# Patient Record
Sex: Female | Born: 1960 | Race: Black or African American | Hispanic: No | Marital: Single | Smoking: Former smoker
Health system: Southern US, Community
[De-identification: ages and names within clinical notes are randomized; demographics above are authoritative.]

## PROBLEM LIST (undated history)

## (undated) DIAGNOSIS — T7840XA Allergy, unspecified, initial encounter: Secondary | ICD-10-CM

## (undated) DIAGNOSIS — F419 Anxiety disorder, unspecified: Secondary | ICD-10-CM

## (undated) DIAGNOSIS — I509 Heart failure, unspecified: Secondary | ICD-10-CM

## (undated) DIAGNOSIS — J45909 Unspecified asthma, uncomplicated: Secondary | ICD-10-CM

## (undated) DIAGNOSIS — F1410 Cocaine abuse, uncomplicated: Secondary | ICD-10-CM

## (undated) DIAGNOSIS — B1920 Unspecified viral hepatitis C without hepatic coma: Secondary | ICD-10-CM

## (undated) DIAGNOSIS — Z720 Tobacco use: Secondary | ICD-10-CM

## (undated) DIAGNOSIS — J439 Emphysema, unspecified: Secondary | ICD-10-CM

## (undated) DIAGNOSIS — I10 Essential (primary) hypertension: Secondary | ICD-10-CM

## (undated) DIAGNOSIS — J939 Pneumothorax, unspecified: Secondary | ICD-10-CM

## (undated) DIAGNOSIS — J449 Chronic obstructive pulmonary disease, unspecified: Secondary | ICD-10-CM

## (undated) DIAGNOSIS — E7800 Pure hypercholesterolemia, unspecified: Secondary | ICD-10-CM

## (undated) DIAGNOSIS — I2510 Atherosclerotic heart disease of native coronary artery without angina pectoris: Secondary | ICD-10-CM

## (undated) HISTORY — PX: CARDIAC CATHETERIZATION: SHX172

## (undated) HISTORY — DX: Allergy, unspecified, initial encounter: T78.40XA

## (undated) HISTORY — DX: Unspecified viral hepatitis C without hepatic coma: B19.20

## (undated) HISTORY — PX: CHEST TUBE INSERTION: SHX231

## (undated) HISTORY — DX: Anxiety disorder, unspecified: F41.9

## (undated) NOTE — *Deleted (*Deleted)
@Patient  ID: Michelle [name], female    DOB: Dec 05, 1960, 70 y.o.   MRN: [id number]  No chief complaint on file.   Referring provider: Marletta Lor, NP  HPI:  34 year old female former smoker followed in our office for COPD, chronic respiratory failure, history of aspiration pneumonia  PMH: Depression, anxiety, PEG tube, protein calorie malnutrition, CAD, hypertension, diastolic heart failure, type 2 diabetes Smoker/ Smoking History: Former smoker Maintenance:   Pt of: DK  04/26/2020  - Visit     Questionaires / Pulmonary Flowsheets:   ACT:  No flowsheet data found.  MMRC: No flowsheet data found.  Epworth:  No flowsheet data found.  Tests:   FENO:  No results found for: NITRICOXIDE  PFT: No flowsheet data found.  WALK:  No flowsheet data found.  Imaging: No results found.  Lab Results:  CBC    Component Value Date/Time   WBC 4.9 09/03/2019 0433   RBC 4.15 09/03/2019 0433   HGB 10.9 (L) 09/03/2019 0433   HGB 12.4 07/11/2014 0434   HCT 35.7 (L) 09/03/2019 0433   HCT 38.9 07/11/2014 0434   PLT 172 09/03/2019 0433   PLT 251 07/11/2014 0434   MCV 86.0 09/03/2019 0433   MCV 87 07/11/2014 0434   MCH 26.3 09/03/2019 0433   MCHC 30.5 09/03/2019 0433   RDW 16.2 (H) 09/03/2019 0433   RDW 13.9 07/11/2014 0434   LYMPHSABS 1.1 09/03/2019 0433   LYMPHSABS 0.9 (L) 07/11/2014 0434   MONOABS 0.6 09/03/2019 0433   MONOABS 1.0 (H) 07/11/2014 0434   EOSABS 0.2 09/03/2019 0433   EOSABS 0.0 07/11/2014 0434   BASOSABS 0.0 09/03/2019 0433   BASOSABS 0.0 07/11/2014 0434    BMET    Component Value Date/Time   NA 144 09/03/2019 0433   NA 139 07/11/2014 0434   K 3.3 (L) 09/03/2019 0433   K 4.6 07/11/2014 0434   CL 111 09/03/2019 0433   CL 104 07/11/2014 0434   CO2 24 09/03/2019 0433   CO2 30 07/11/2014 0434   GLUCOSE 114 (H) 09/03/2019 0433   GLUCOSE 123 (H) 07/11/2014 0434   BUN 19 09/03/2019 0433   BUN 18 07/11/2014 0434   CREATININE 0.68 09/03/2019 0433    CREATININE 0.80 07/11/2014 0434   CALCIUM 8.3 (L) 09/03/2019 0433   CALCIUM 8.9 07/11/2014 0434   GFRNONAA >60 09/03/2019 0433   GFRNONAA >60 07/11/2014 0434   GFRNONAA >60 11/04/2013 0354   GFRAA >60 09/03/2019 0433   GFRAA >60 07/11/2014 0434   GFRAA >60 11/04/2013 0354    BNP    Component Value Date/Time   BNP 42.0 06/24/2019 2057    ProBNP No results found for: PROBNP  Specialty Problems      Pulmonary Problems   Community acquired pneumonia   COPD exacerbation (HCC)   Acute on chronic respiratory failure with hypoxia (HCC)   Acute on chronic respiratory failure with hypoxia and hypercapnia (HCC)   Pneumonia   Chronic respiratory failure requiring continuous mechanical ventilation through tracheostomy (HCC)   Pseudomonas respiratory infection   HCAP (healthcare-associated pneumonia)   Atypical pneumonia      Allergies  Allergen Reactions  . Amoxicillin     Patient has tolerated cephalosporins in the past  . Ativan [Lorazepam]     Makes anxiety worse     Immunization History  Administered Date(s) Administered  . PFIZER SARS-COV-2 Vaccination 02/17/2020, 03/08/2020  . Pneumococcal Conjugate-13 12/22/2017    Past Medical History:  Diagnosis Date  .  Allergy   . Anxiety   . Asthma   . CHF (congestive heart failure) (HCC)   . Cocaine abuse (HCC)   . COPD (chronic obstructive pulmonary disease) (HCC)   . Coronary artery disease   . Emphysema/COPD (HCC)   . Hepatitis C   . High cholesterol   . Hypertension   . Pneumothorax   . Tobacco abuse     Tobacco History: Social History   Tobacco Use  Smoking Status Former Smoker  . Packs/day: 0.10  . Years: 41.00  . Pack years: 4.10  . Types: Cigarettes  . Quit date: 06/09/2017  . Years since quitting: 2.8  Smokeless Tobacco Never Used   Counseling given: Not Answered   Continue to not smoke  Outpatient Encounter Medications as of 04/26/2020  Medication Sig  . aspirin 81 MG chewable tablet Chew  81 mg by mouth daily.   . blood glucose meter kit and supplies KIT Dispense based on patient and insurance preference. Use up to four times daily as directed. (FOR ICD-9 250.00, 250.01).  . budesonide (PULMICORT) 0.25 MG/2ML nebulizer solution Take 2 mLs (0.25 mg total) by nebulization 2 (two) times daily. (Patient taking differently: Take 0.5 mg by nebulization 2 (two) times daily. )  . clonazePAM (KLONOPIN) 0.5 MG tablet Take 0.5 mg by mouth in the morning, at noon, and at bedtime.  . cloNIDine (CATAPRES - DOSED IN MG/24 HR) 0.1 mg/24hr patch Place 0.1 mg onto the skin once a week.  . fluticasone (FLONASE) 50 MCG/ACT nasal spray Place 1 spray into both nostrils daily.   Marland Kitchen gabapentin (NEURONTIN) 100 MG capsule Take 100 mg by mouth 3 (three) times daily. One tablet in am and mid day, and 3 at bedtime.  Marland Kitchen guaiFENesin (MUCINEX) 600 MG 12 hr tablet Take 600 mg by mouth 2 (two) times daily.  . hydrOXYzine (ATARAX/VISTARIL) 25 MG tablet Take 25 mg by mouth every 6 (six) hours as needed for anxiety. (Patient not taking: Reported on 04/07/2020)  . insulin detemir (LEVEMIR) 100 UNIT/ML FlexPen Inject 10 Units into the skin at bedtime.  . Insulin Pen Needle (PEN NEEDLES) 32G X 6 MM MISC 1 each by Does not apply route 4 (four) times daily - after meals and at bedtime.  Marland Kitchen ipratropium-albuterol (DUONEB) 0.5-2.5 (3) MG/3ML SOLN Take 3 mLs by nebulization every 6 (six) hours. (Patient taking differently: Take 3 mLs by nebulization every 6 (six) hours as needed (for shortness of breath). )  . mirtazapine (REMERON SOL-TAB) 30 MG disintegrating tablet Take 1 tablet (30 mg total) by mouth at bedtime.  . polyethylene glycol (MIRALAX / GLYCOLAX) 17 g packet Place 17 g into feeding tube daily as needed.  (Patient not taking: Reported on 04/07/2020)  . senna (SENOKOT) 8.6 MG TABS tablet Place 1-2 tablets into feeding tube at bedtime as needed for mild constipation.  (Patient not taking: Reported on 04/07/2020)   No  facility-administered encounter medications on file as of 04/26/2020.     Review of Systems  Review of Systems   Physical Exam  LMP 03/06/2005 (Approximate)   Wt Readings from Last 5 Encounters:  08/29/19 190 lb 0.6 oz (86.2 kg)  08/09/19 186 lb 8.2 oz (84.6 kg)  06/25/19 197 lb 5 oz (89.5 kg)  03/14/19 189 lb 6 oz (85.9 kg)  12/08/18 164 lb (74.4 kg)    BMI Readings from Last 5 Encounters:  08/29/19 33.66 kg/m  08/09/19 35.26 kg/m  06/25/19 38.53 kg/m  03/14/19 36.98 kg/m  12/08/18  30.99 kg/m     Physical Exam    Assessment & Plan:   No problem-specific Assessment & Plan notes found for this encounter.    No follow-ups on file.   Coral Ceo, NP 04/26/2020   This appointment required *** minutes of patient care (this includes precharting, chart review, review of results, face-to-face care, etc.).

---

## 2006-11-07 ENCOUNTER — Emergency Department: Payer: Self-pay | Admitting: Emergency Medicine

## 2008-02-20 IMAGING — CR DG CHEST 2V
1 series · 2 of 2 positions shown · non-contrast
Comparison: none

REASON FOR EXAM: COUGH
COMMENTS:

PROCEDURE:     DXR - DXR CHEST PA (OR AP) AND LATERAL  - November 07, 2006 [DATE]
RESULT:     The lung fields are clear. The heart, mediastinal and osseous
structures show no significant abnormalities. The chest is hyperexpanded,
suspicious for reactive airway disease.

[Series 1: view not recorded · 0.17mm/px · 2 of 2 slices shown]
[im 1/2]
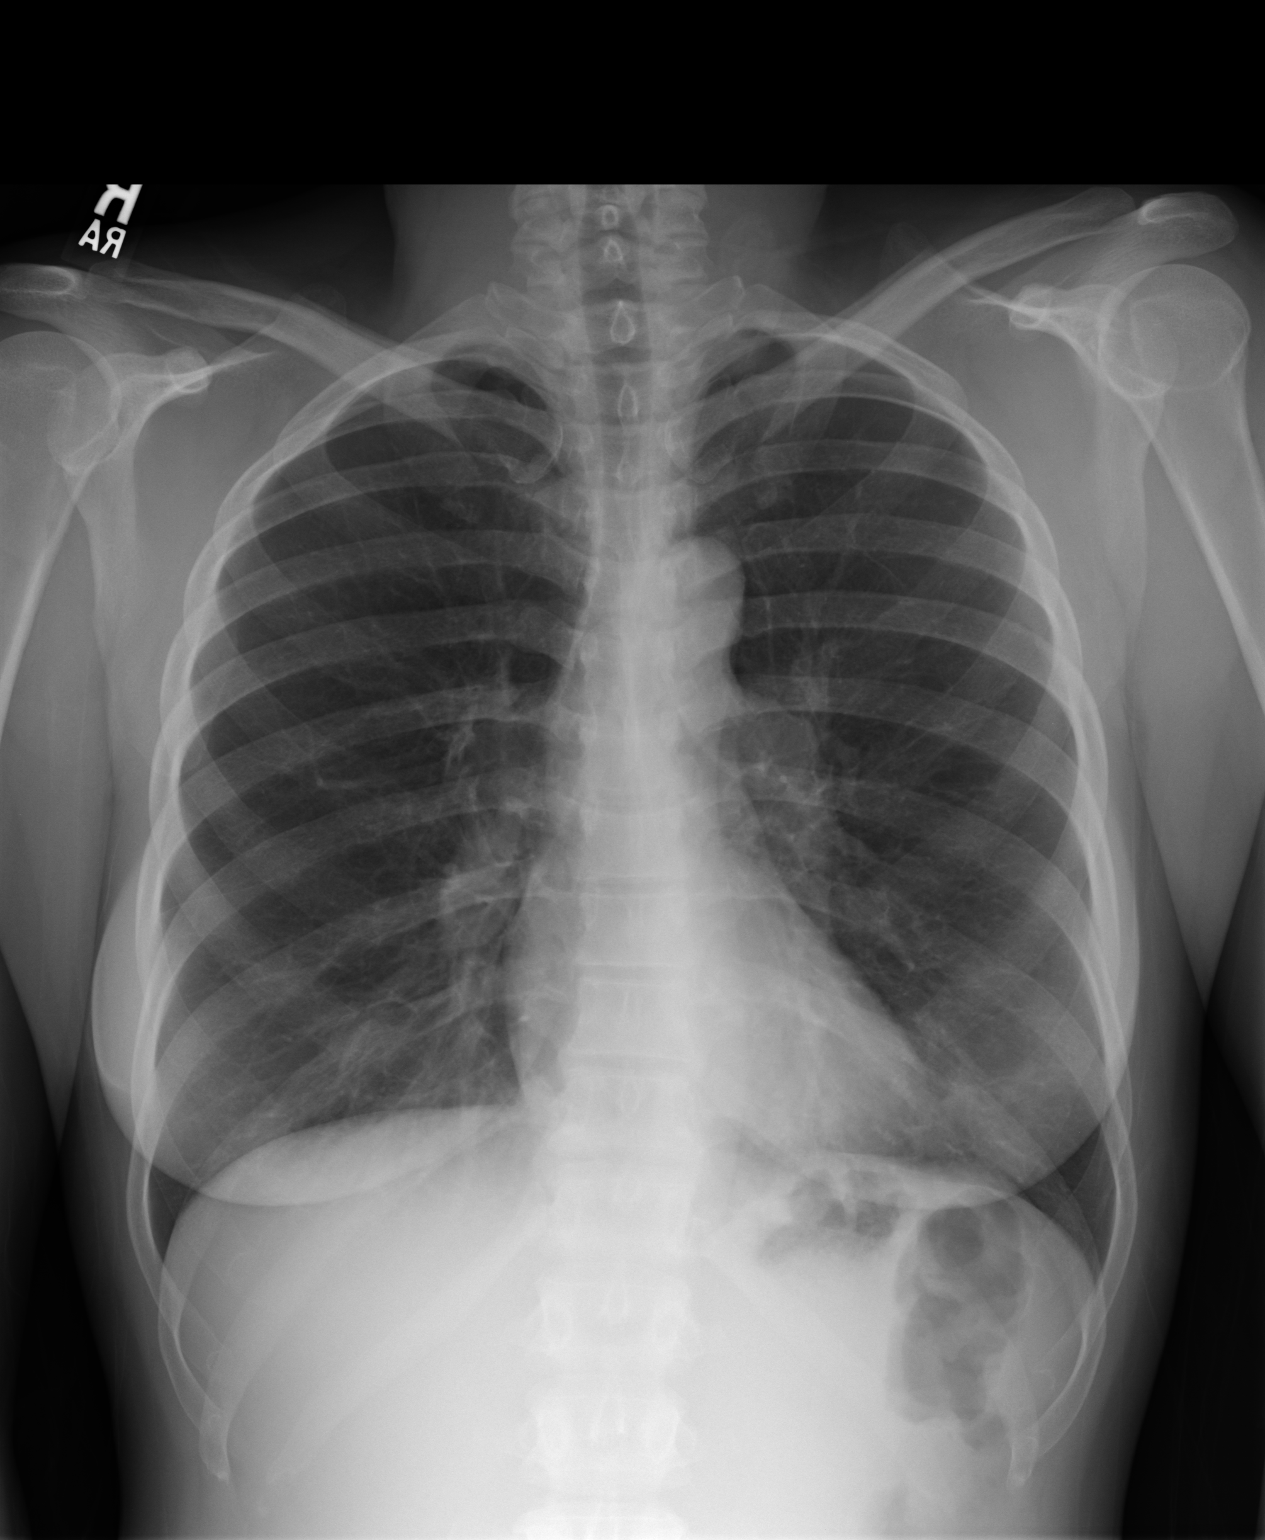
[im 2/2]
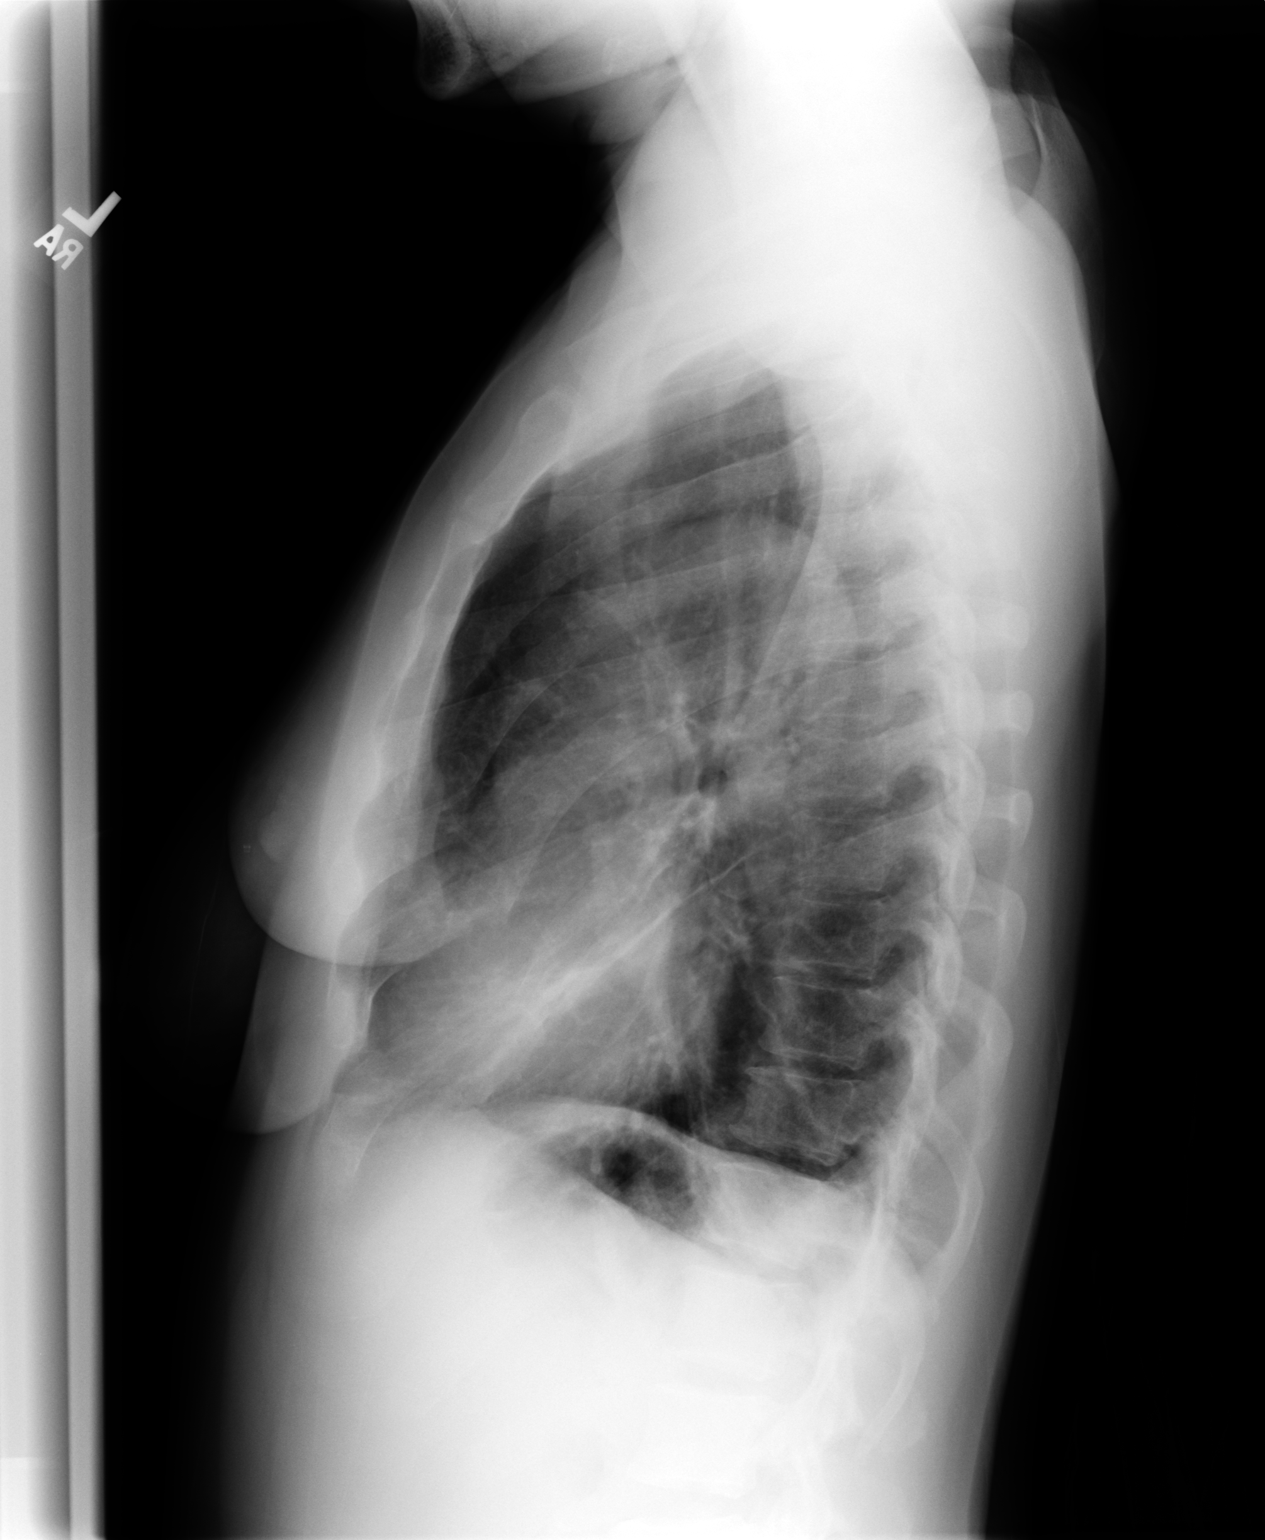

[2 of 2 positions shown; findings below may reference images not displayed]

IMPRESSION: 1. The lung fields are clear.
2. The chest is hyperexpanded, suspicious for reactive airway disease.

## 2009-09-30 ENCOUNTER — Inpatient Hospital Stay: Payer: Self-pay | Admitting: Specialist

## 2009-12-10 IMAGING — US US BREAST BILAT
1 series · 5 of 5 positions shown · non-contrast
Comparison: none

[Series 1: us breast bilat · 5 of 5 slices shown]
[im 1/5]
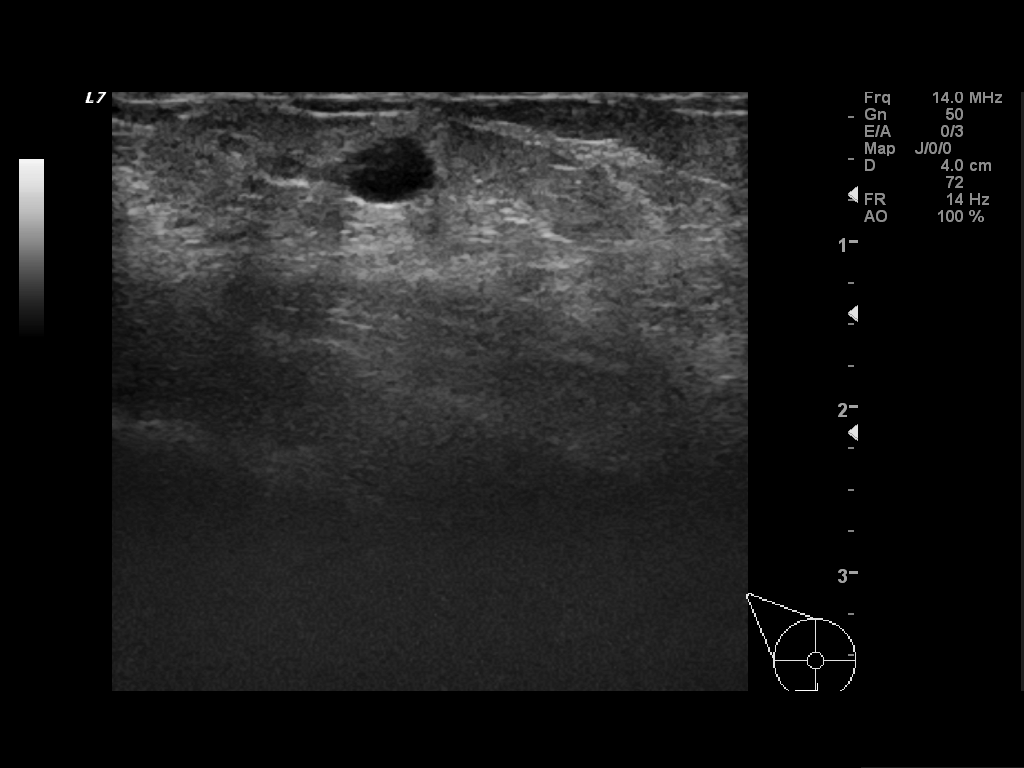
[im 2/5]
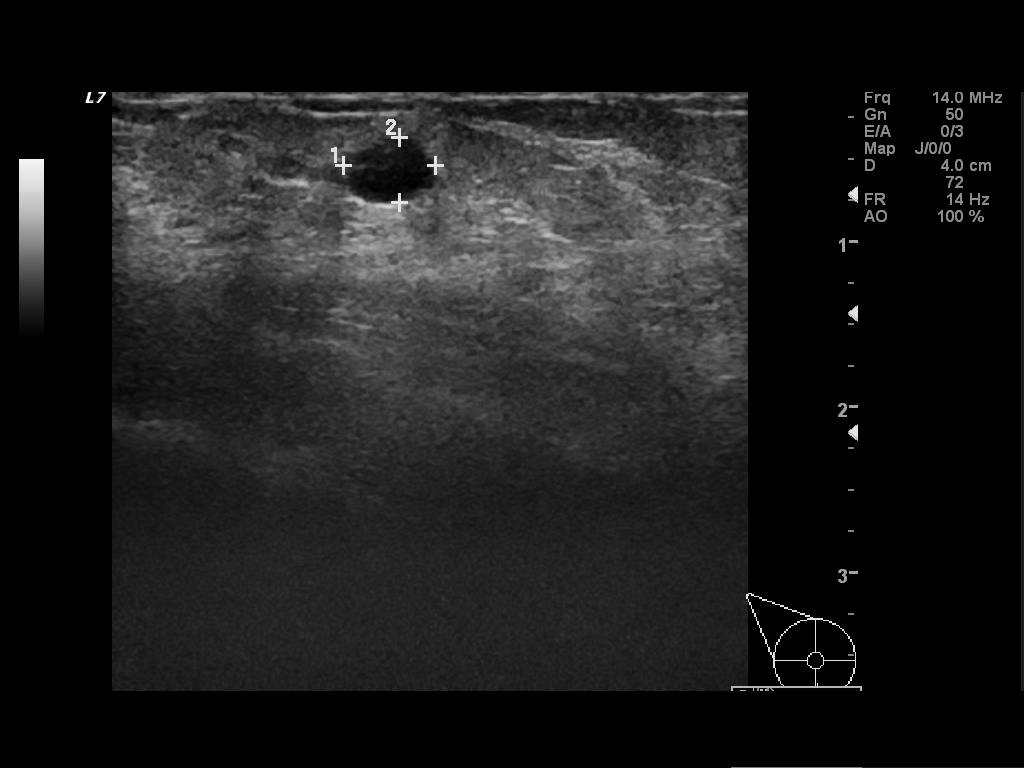
[im 3/5]
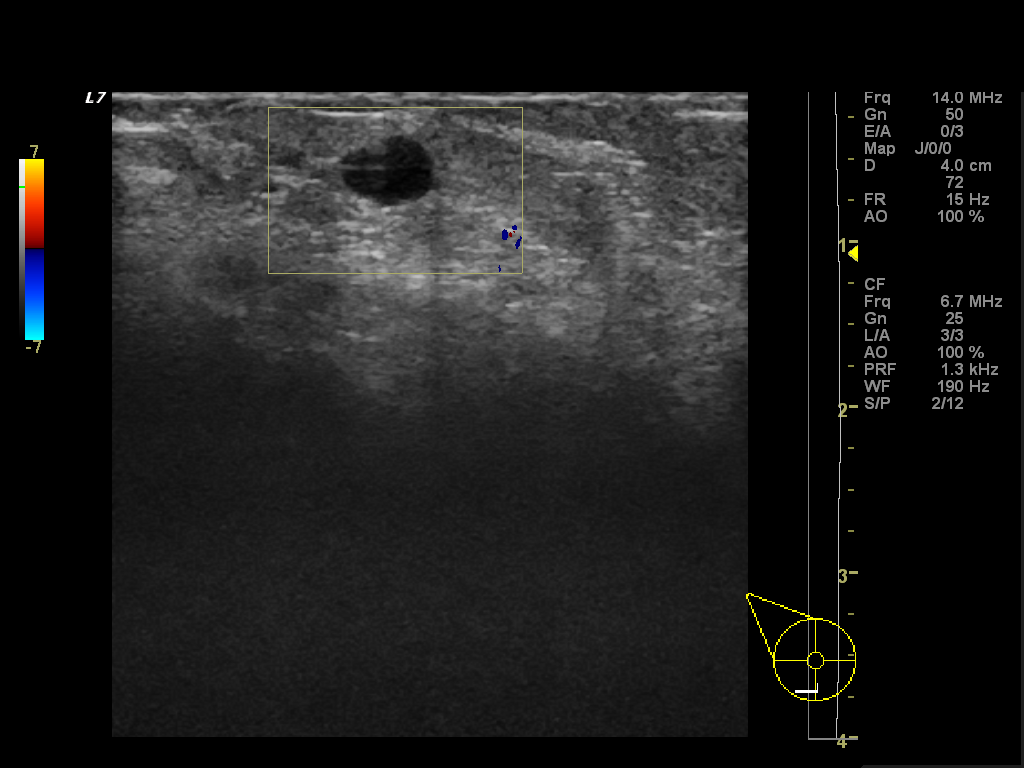
[im 4/5]
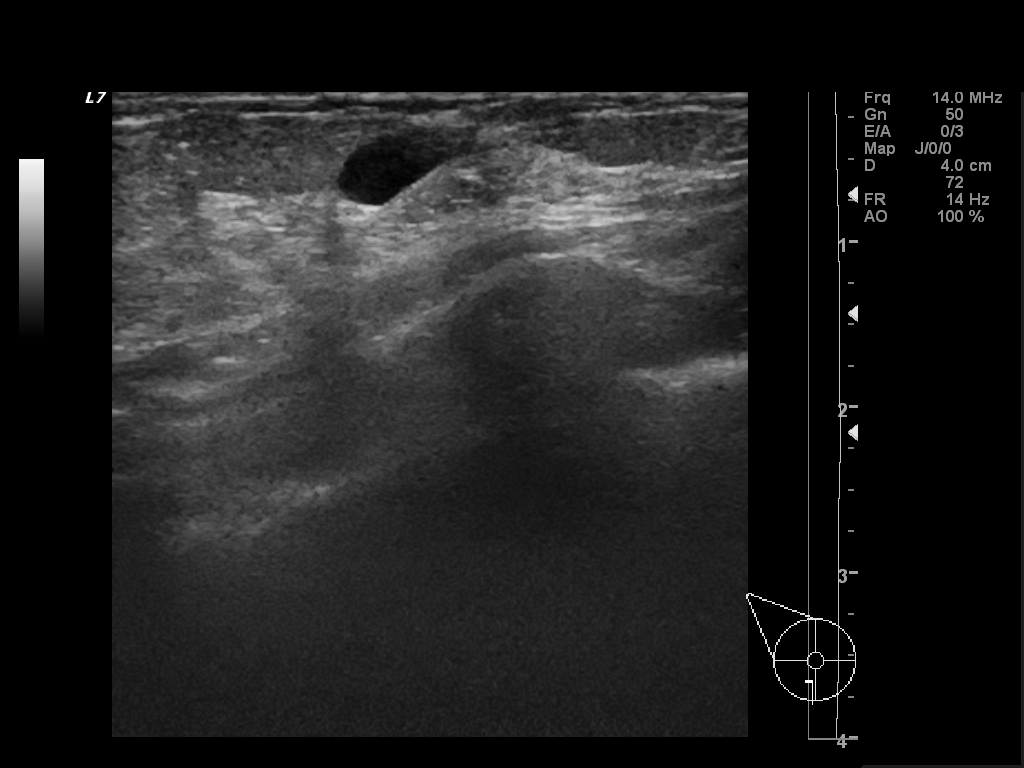
[im 5/5]
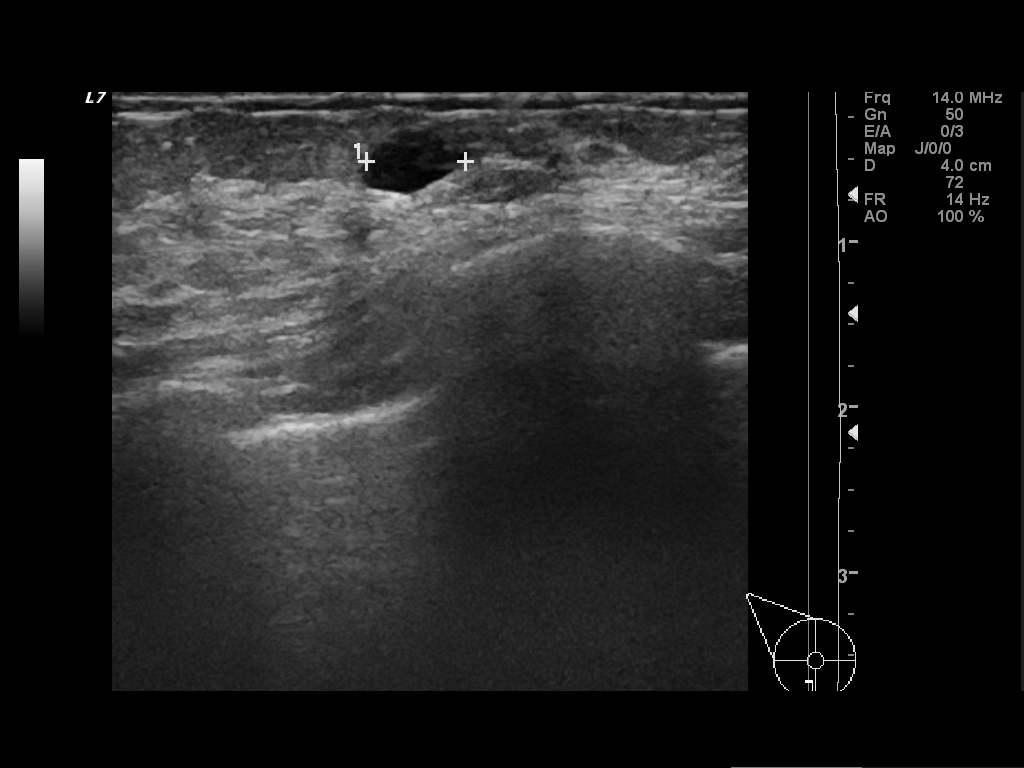

[5 of 5 positions shown; findings below may reference images not displayed]

Canned report from images found in remote index.

Refer to host system for actual result text.

## 2010-09-06 ENCOUNTER — Ambulatory Visit: Payer: Self-pay | Admitting: Family Medicine

## 2010-10-11 ENCOUNTER — Inpatient Hospital Stay: Payer: Self-pay | Admitting: Internal Medicine

## 2011-01-13 IMAGING — CR DG CHEST 1V PORT
1 series · 1 of 1 positions shown · non-contrast
Comparison: none

REASON FOR EXAM: Respiratory distress
COMMENTS:

[view not recorded]
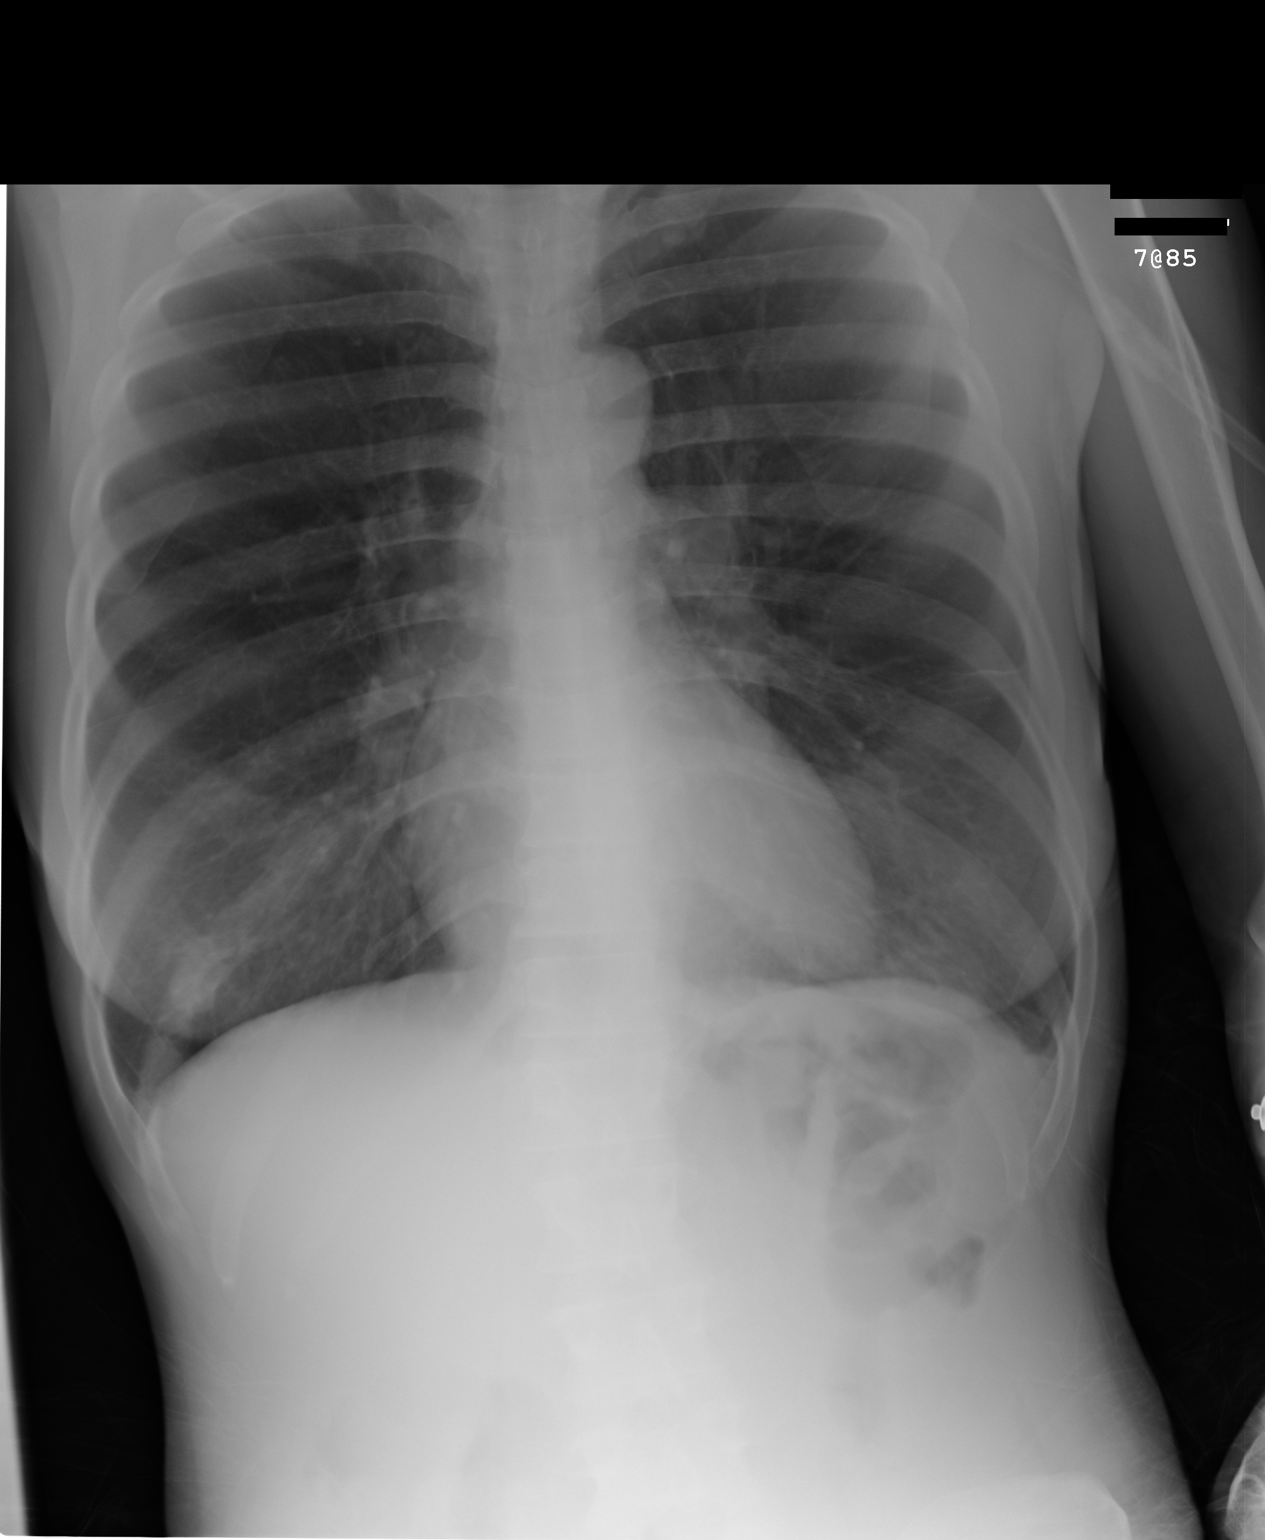

[1 of 1 positions shown; findings below may reference images not displayed]

PROCEDURE:     DXR - DXR PORTABLE CHEST SINGLE VIEW  - September 30, 2009  [DATE]

RESULT:     Comparison is made to a prior exam of 11/07/2006. There is noted a
focal density at the right base. This is thought to likely represent a
confluence of density secondary to overlapping ribs, vascular markings and
the right nipple shadow. An early focal infiltrate cannot be totally
excluded but at this point is thought to be unlikely. Nonetheless, follow-up
examination is suggested if clinically indicated. The lung fields otherwise
are clear. The chest appears mildly hyperexpanded. The possibility of
reactive airway disease cannot be excluded. Heart size is normal. The
mediastinal and osseous structures are normal in appearance.
IMPRESSION: 1. There is a focal density at the RIGHT base, most likely artifactual and
secondary to overlapping densities.
2. The chest is mildly hyperexpanded, suspicious for history of reactive
airway disease.

## 2011-06-05 ENCOUNTER — Inpatient Hospital Stay: Payer: Self-pay | Admitting: Internal Medicine

## 2011-06-05 LAB — CBC
HCT: 44.7 % (ref 35.0–47.0)
MCHC: 33.4 g/dL (ref 32.0–36.0)
Platelet: 156 10*3/uL (ref 150–440)
RBC: 5.12 10*6/uL (ref 3.80–5.20)
RDW: 13.3 % (ref 11.5–14.5)

## 2011-06-05 LAB — COMPREHENSIVE METABOLIC PANEL
Albumin: 3.9 g/dL (ref 3.4–5.0)
Alkaline Phosphatase: 61 U/L (ref 50–136)
Bilirubin,Total: 0.4 mg/dL (ref 0.2–1.0)
Creatinine: 0.75 mg/dL (ref 0.60–1.30)
EGFR (African American): >60
Glucose: 153 mg/dL — ABNORMAL HIGH (ref 65–99)
Osmolality: 287 (ref 275–301)
Potassium: 3.3 mmol/L — ABNORMAL LOW (ref 3.5–5.1)
Sodium: 142 mmol/L (ref 136–145)

## 2011-06-05 LAB — MAGNESIUM: Magnesium: 1.8 mg/dL

## 2011-06-05 LAB — PRO B NATRIURETIC PEPTIDE: B-Type Natriuretic Peptide: 90 pg/mL (ref 0–125)

## 2011-06-05 LAB — RAPID INFLUENZA A&B ANTIGENS

## 2011-06-06 LAB — CBC WITH DIFFERENTIAL/PLATELET
Basophil %: 0 %
Eosinophil %: 0 %
HGB: 12.1 g/dL (ref 12.0–16.0)
Lymphocyte #: 0.6 10*3/uL — ABNORMAL LOW (ref 1.0–3.6)
MCH: 28.6 pg (ref 26.0–34.0)
MCV: 90 fL (ref 80–100)
Monocyte #: 0.5 10*3/uL (ref 0.0–0.7)
Neutrophil %: 83.7 %
Platelet: 166 10*3/uL (ref 150–440)
RBC: 4.22 10*6/uL (ref 3.80–5.20)
WBC: 6.9 10*3/uL (ref 3.6–11.0)

## 2011-06-06 LAB — BASIC METABOLIC PANEL
Anion Gap: 10 (ref 7–16)
BUN: 10 mg/dL (ref 7–18)
Chloride: 106 mmol/L (ref 98–107)
Glucose: 129 mg/dL — ABNORMAL HIGH (ref 65–99)
Osmolality: 276 (ref 275–301)
Potassium: 4.4 mmol/L (ref 3.5–5.1)

## 2011-06-10 LAB — CULTURE, BLOOD (SINGLE)

## 2011-06-11 LAB — EXPECTORATED SPUTUM ASSESSMENT W GRAM STAIN, RFLX TO RESP C

## 2011-10-04 ENCOUNTER — Observation Stay: Payer: Self-pay | Admitting: Specialist

## 2011-10-04 LAB — URINALYSIS, COMPLETE
Bilirubin,UR: NEGATIVE
Leukocyte Esterase: NEGATIVE
Ph: 6 (ref 4.5–8.0)
Protein: 100
Squamous Epithelial: 5
WBC UR: 3 /HPF (ref 0–5)

## 2011-10-04 LAB — DRUG SCREEN, URINE
Amphetamines, Ur Screen: NEGATIVE (ref ?–1000)
Barbiturates, Ur Screen: NEGATIVE (ref ?–200)
Benzodiazepine, Ur Scrn: NEGATIVE (ref ?–200)
Opiate, Ur Screen: NEGATIVE (ref ?–300)
Phencyclidine (PCP) Ur S: NEGATIVE (ref ?–25)
Tricyclic, Ur Screen: NEGATIVE (ref ?–1000)

## 2011-10-04 LAB — COMPREHENSIVE METABOLIC PANEL
Alkaline Phosphatase: 83 U/L (ref 50–136)
Anion Gap: 8 (ref 7–16)
BUN: 20 mg/dL — ABNORMAL HIGH (ref 7–18)
Bilirubin,Total: 0.6 mg/dL (ref 0.2–1.0)
Chloride: 105 mmol/L (ref 98–107)
Creatinine: 0.91 mg/dL (ref 0.60–1.30)
EGFR (African American): >60
EGFR (Non-African Amer.): >60
Glucose: 105 mg/dL — ABNORMAL HIGH (ref 65–99)
Osmolality: 282 (ref 275–301)
Potassium: 3.5 mmol/L (ref 3.5–5.1)
SGOT(AST): 31 U/L (ref 15–37)
Sodium: 140 mmol/L (ref 136–145)

## 2011-10-04 LAB — MAGNESIUM: Magnesium: 1.7 mg/dL — ABNORMAL LOW

## 2011-10-04 LAB — CBC
HCT: 43.5 % (ref 35.0–47.0)
MCHC: 32.8 g/dL (ref 32.0–36.0)
RBC: 4.92 10*6/uL (ref 3.80–5.20)
RDW: 13.6 % (ref 11.5–14.5)
WBC: 5.6 10*3/uL (ref 3.6–11.0)

## 2011-10-04 LAB — PROTIME-INR
INR: 1
Prothrombin Time: 13.9 secs (ref 11.5–14.7)

## 2011-10-04 LAB — CK TOTAL AND CKMB (NOT AT ARMC): CK-MB: 2.6 ng/mL (ref 0.5–3.6)

## 2011-10-05 LAB — CK TOTAL AND CKMB (NOT AT ARMC)
CK, Total: 145 U/L (ref 21–215)
CK, Total: 129 U/L (ref 21–215)
CK-MB: 2.1 ng/mL (ref 0.5–3.6)
CK-MB: 1.7 ng/mL (ref 0.5–3.6)

## 2011-10-05 LAB — TROPONIN I: Troponin-I: <0.02 ng/mL

## 2012-01-24 IMAGING — CR DG CHEST 2V
1 series · 2 of 2 positions shown · non-contrast
Comparison: none

REASON FOR EXAM: SOB
COMMENTS:

[Series 1: view not recorded · 0.17mm/px · 2 of 2 slices shown]
[im 1/2]
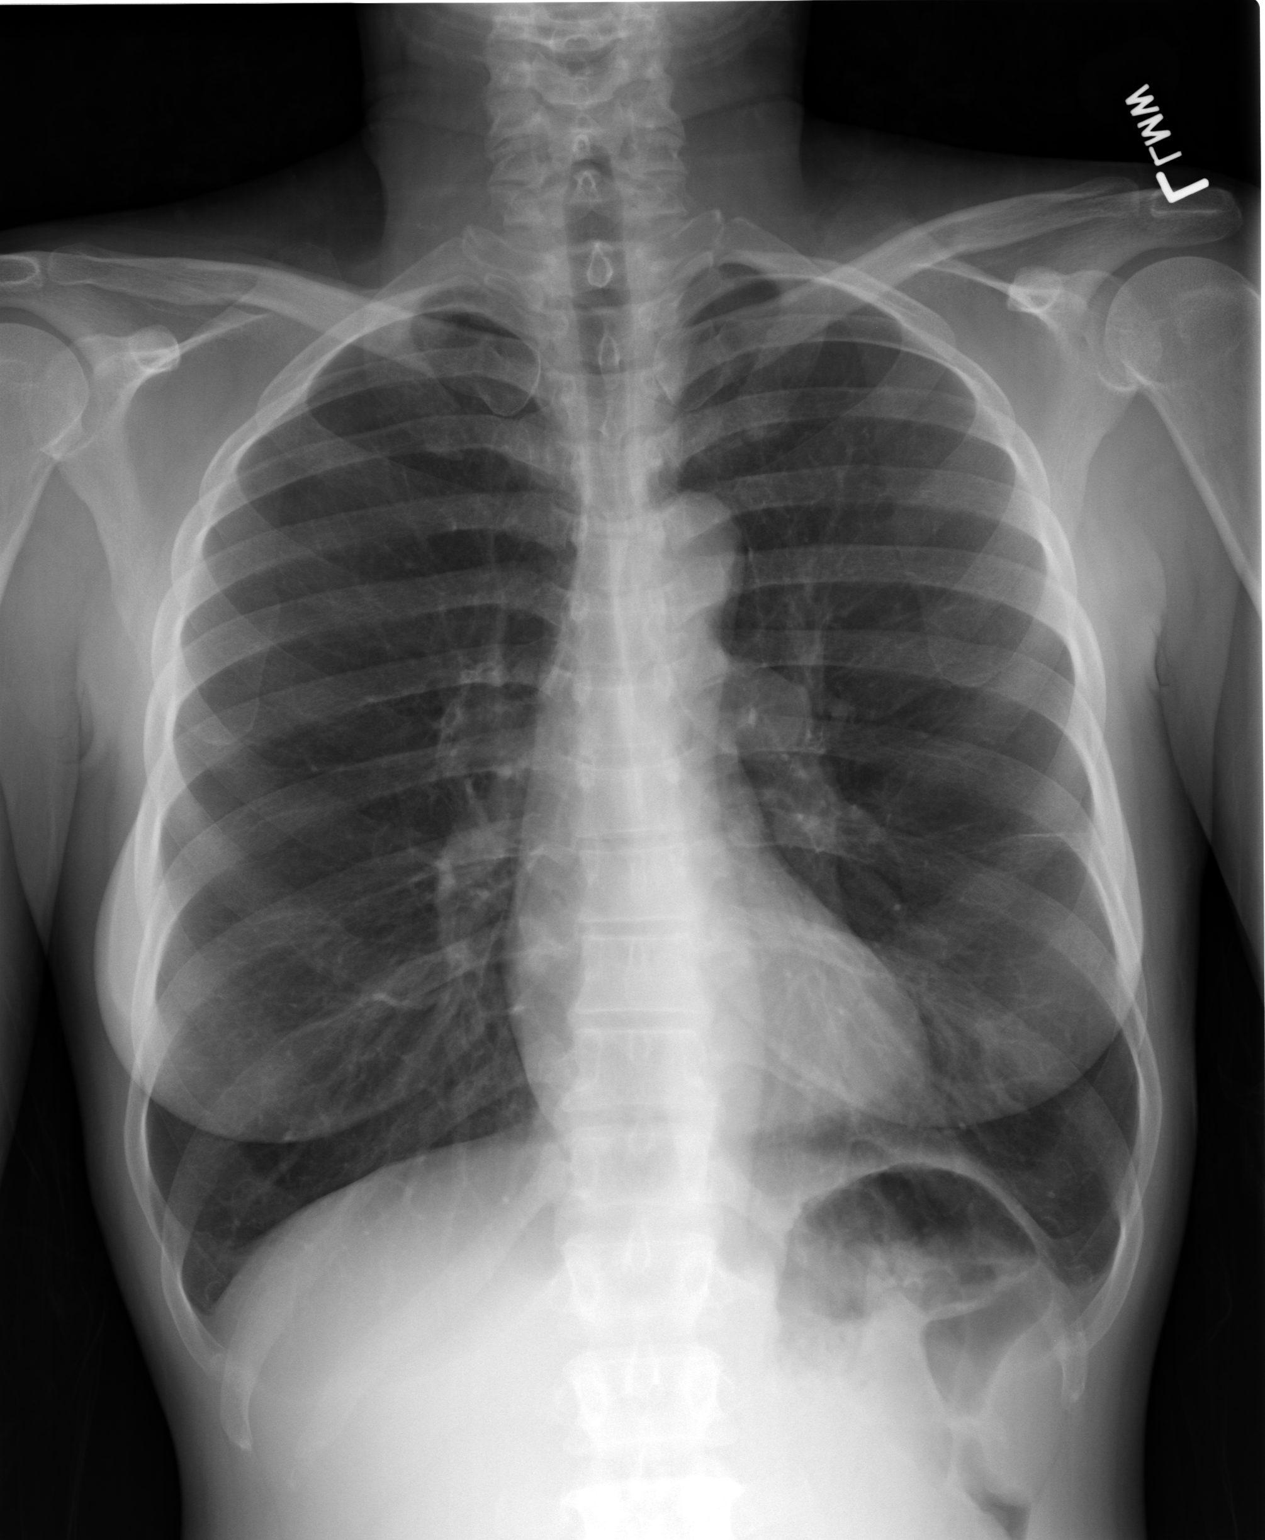
[im 2/2]
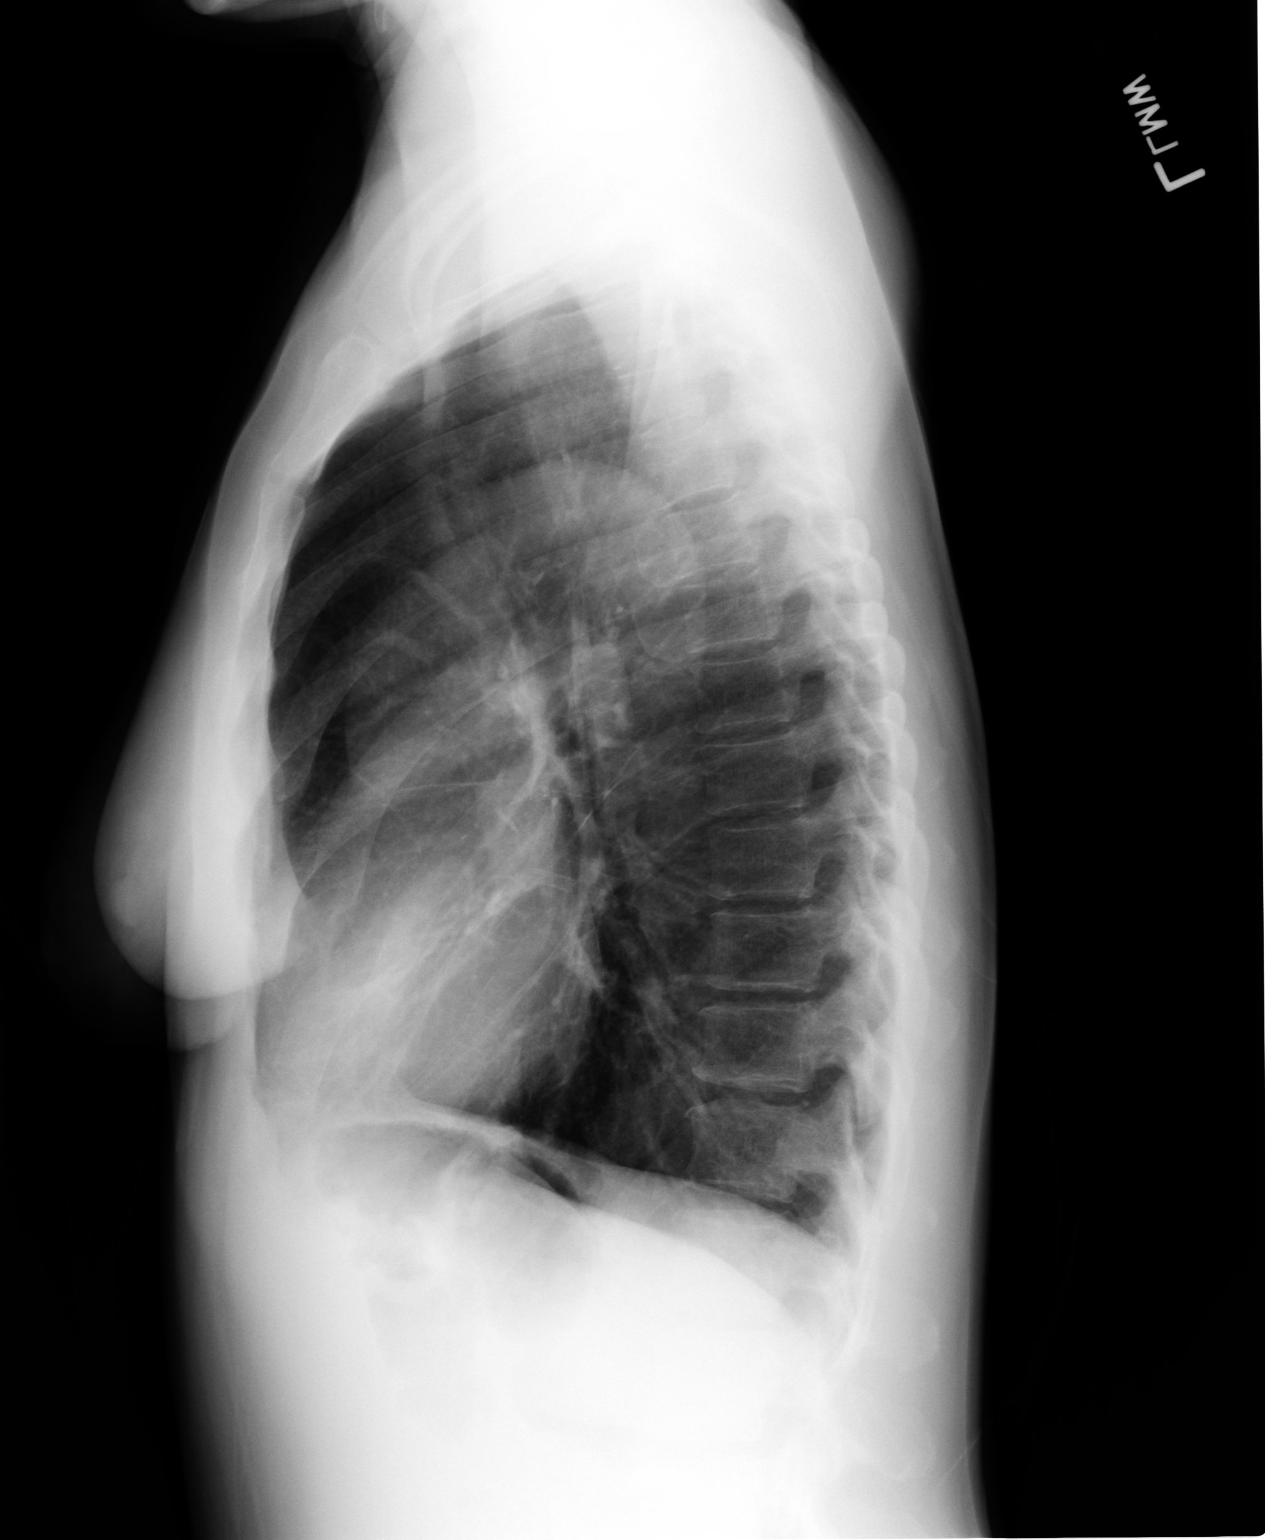

[2 of 2 positions shown; findings below may reference images not displayed]

PROCEDURE:     DXR - DXR CHEST PA (OR AP) AND LATERAL  - October 11, 2010  [DATE]

RESULT:     Comparison is made to a prior exam of 09/30/2009.

The lung fields are clear. No pneumonia, pneumothorax or pleural effusion is
seen. The heart size is normal. The chest appears bilaterally hyperinflated
which suggests a history of asthma. The osseous structures are normal in
appearance.
IMPRESSION: 1.  The lung fields are clear.
2.  The chest appears bilaterally hyperinflated, suggestive of a history of
asthma.

## 2012-09-17 IMAGING — CT CT CHEST W/ CM
1 series · 15 of 31 positions shown, 19 images · IV contrast (isovue)
Comparison: none

REASON FOR EXAM: shortness of breath
COMMENTS:

PROCEDURE:     CT  - CT CHEST (FOR PE) W  - June 05, 2011  [DATE]
RESULT:     Comparison: None
TECHNIQUE: Multiple thin section axial images were obtained from the lung
apices to the upper abdomen following 75 ml Isovue 370 intravenous contrast,
according to the PE protocol. These images were also reviewed on a Siemens
multiplanar work station.

[Series 4: soft tissue · axial · 0.70mm/px · z∈[+24,+312]mm · 15 of 104 slices shown, 19 images]
[im 4/104  mediastinal]
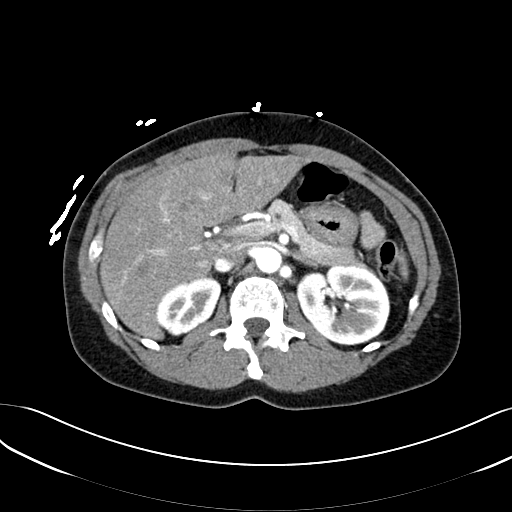
[im 4/104  lung]
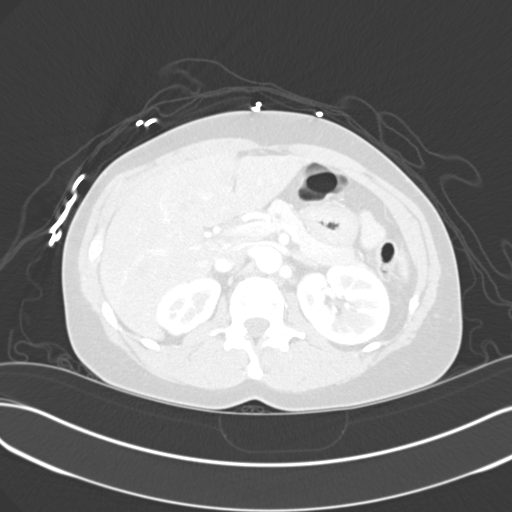
[im 12/104  lung]
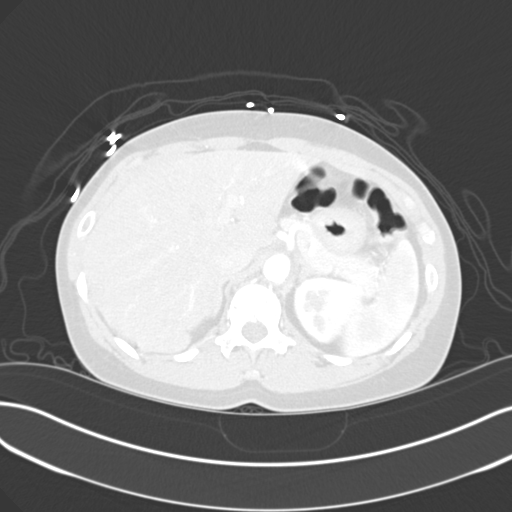
[im 20/104  lung]
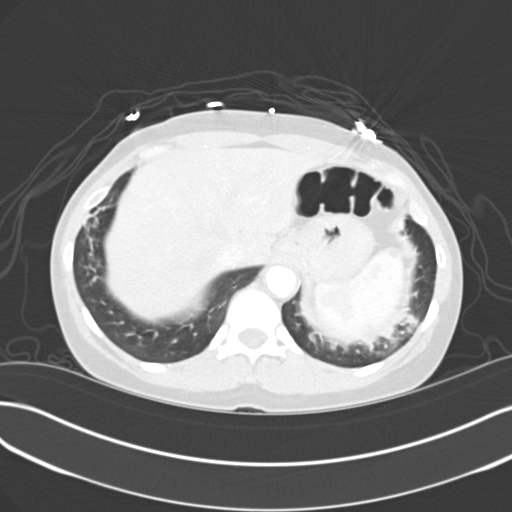
[im 23/104  lung]
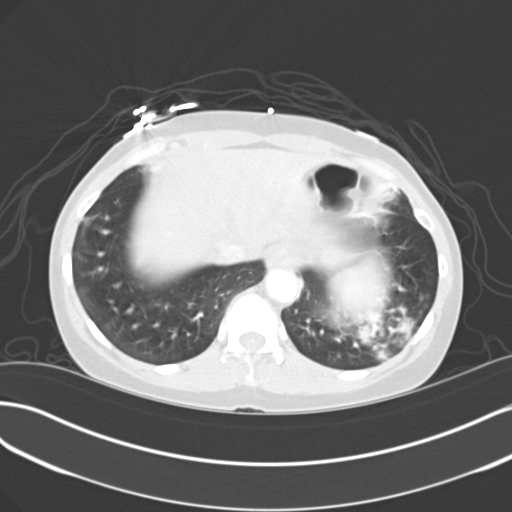
[im 31/104  mediastinal]
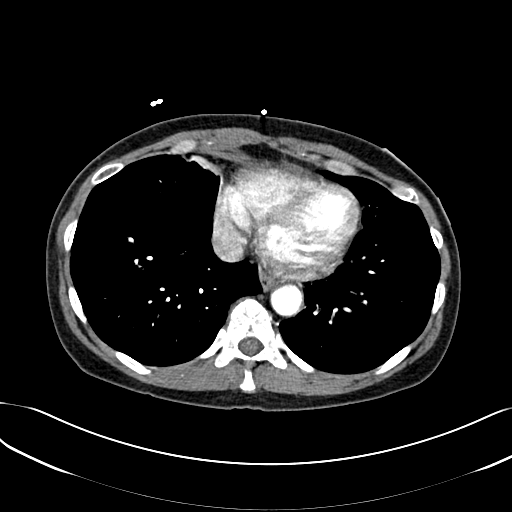
[im 31/104  lung]
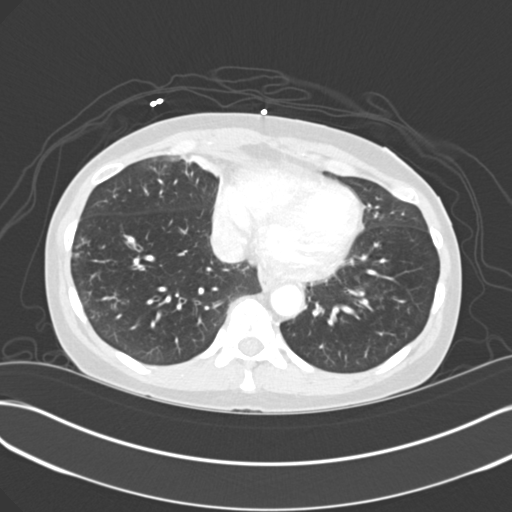
[im 39/104  lung]
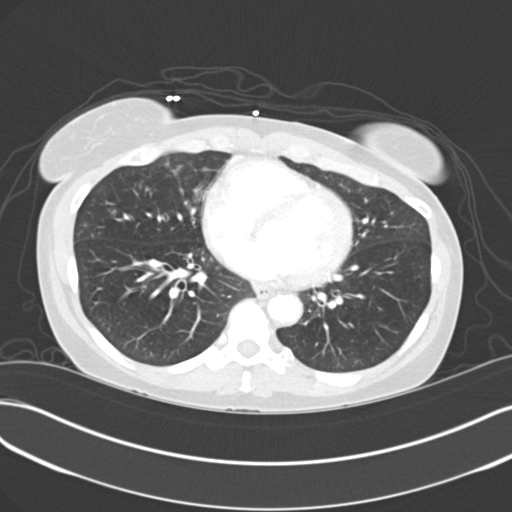
[im 46/104  lung]
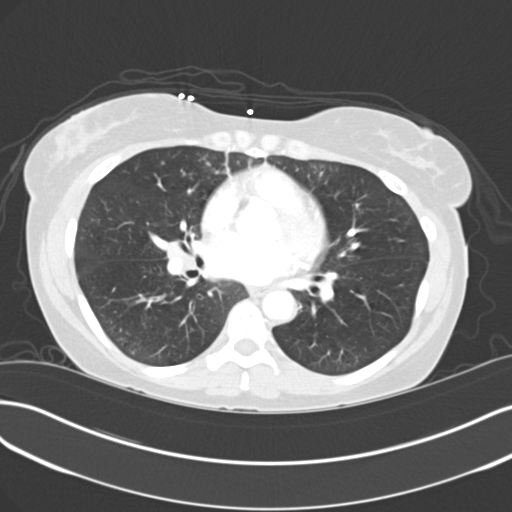
[im 54/104  lung]
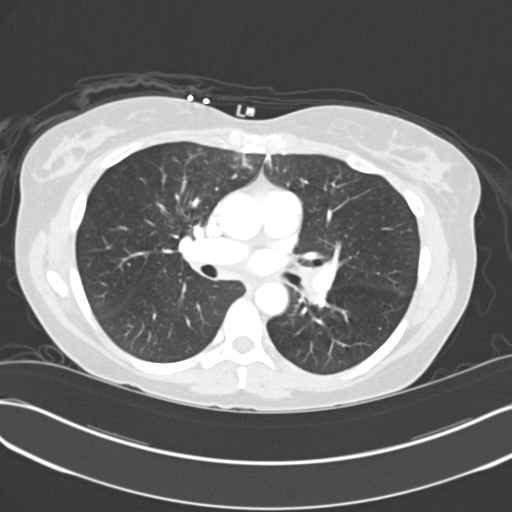
[im 58/104  mediastinal]
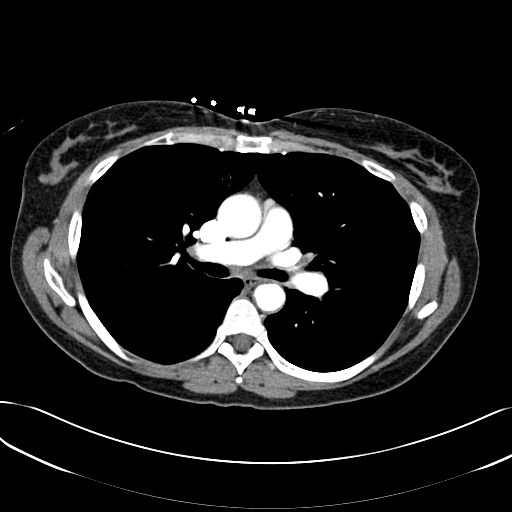
[im 58/104  lung]
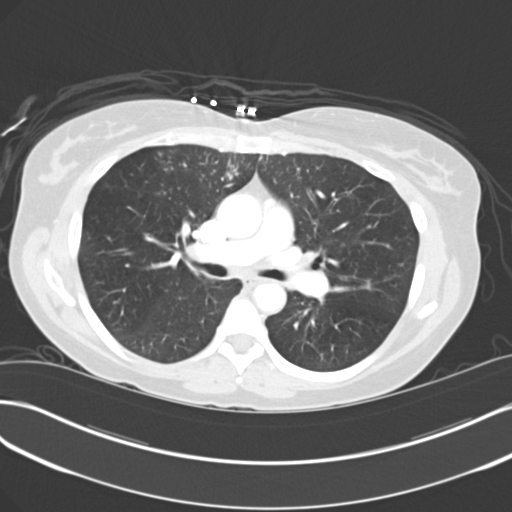
[im 65/104  lung]
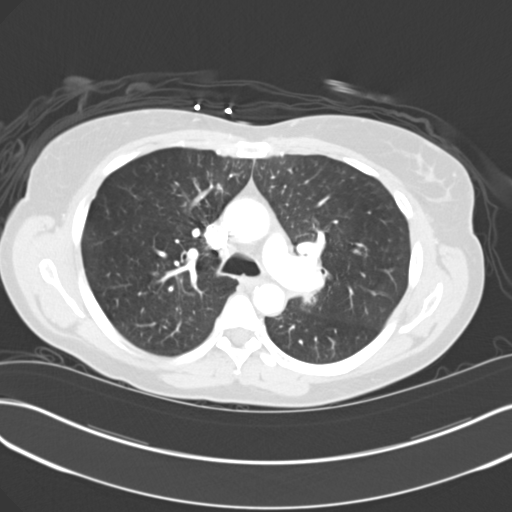
[im 73/104  lung]
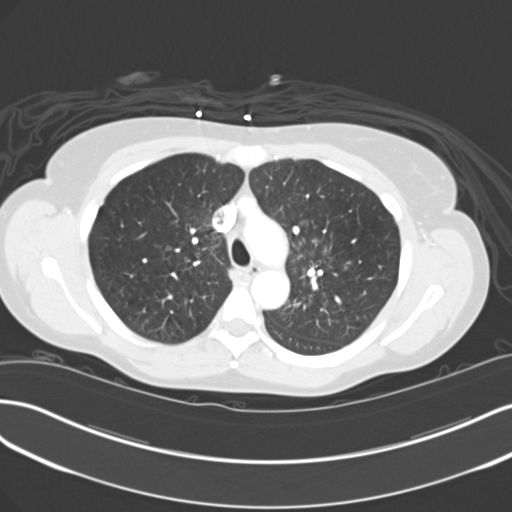
[im 81/104  lung]
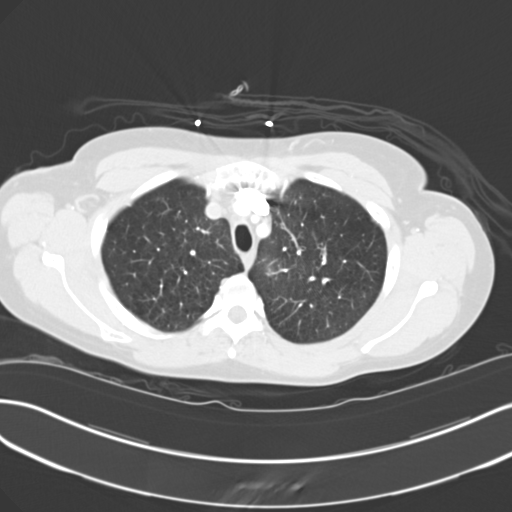
[im 84/104  mediastinal]
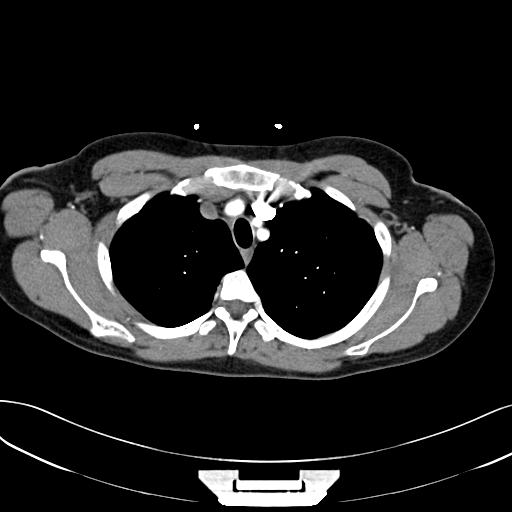
[im 84/104  lung]
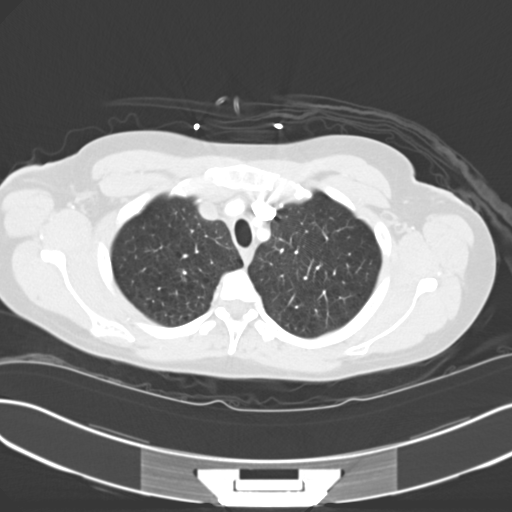
[im 92/104  lung]
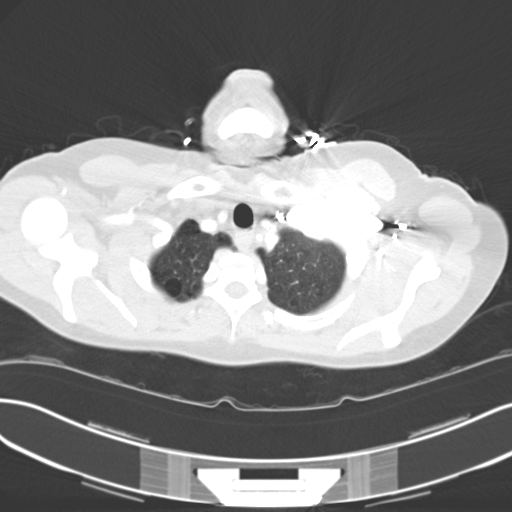
[im 100/104  lung]
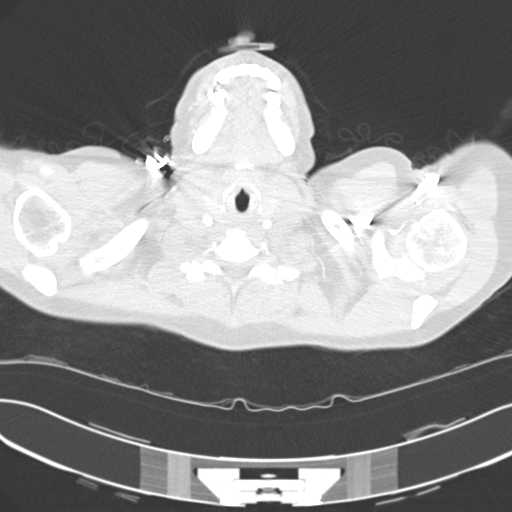

[15 of 31 positions shown; findings below may reference images not displayed]

FINDINGS: No mediastinal, hilar, or axillary lymphadenopathy. There are several mildly
prominent, but not pathologically enlarged mediastinal and hilar lymph nodes.

The thoracic aorta is normal in caliber. No pulmonary embolus identified.

There is mild centrilobular emphysema. Centrilobular nodular opacities are
seen in the anterior right middle lobe and left upper lobe. There are
heterogeneous opacities at the left lower lobe which likely represent
infection. There is a nodular subpleural opacity in the right lower lobe. It
measures 1.3 x 0.9 cm.

No aggressive lytic or sclerotic osseous lesions are identified
IMPRESSION: 1. No pulmonary embolus.
2. Findings which likely represent multifocal infection, including atypical
infection. Subpleural nodular density in the right lower lobe is likely
related to this process. Followup noncontrast chest CT is recommended in 3
months to ensure resolution and exclude other etiology.

This was called to Dr. Mizuho Maruf at 4884 hours 06/05/2011.

## 2013-01-16 IMAGING — CR DG CHEST 1V PORT
1 series · 1 of 1 positions shown · non-contrast
Comparison: none

REASON FOR EXAM: cp
COMMENTS:

PROCEDURE:     DXR - DXR PORTABLE CHEST SINGLE VIEW  - October 04, 2011  [DATE]
RESULT:     No acute abnormality. Lower lobe infiltrate is clear from
06/05/2011. The cardiovascular structures are unremarkable. No pneumothorax.
No bony abnormalities.

[ap]
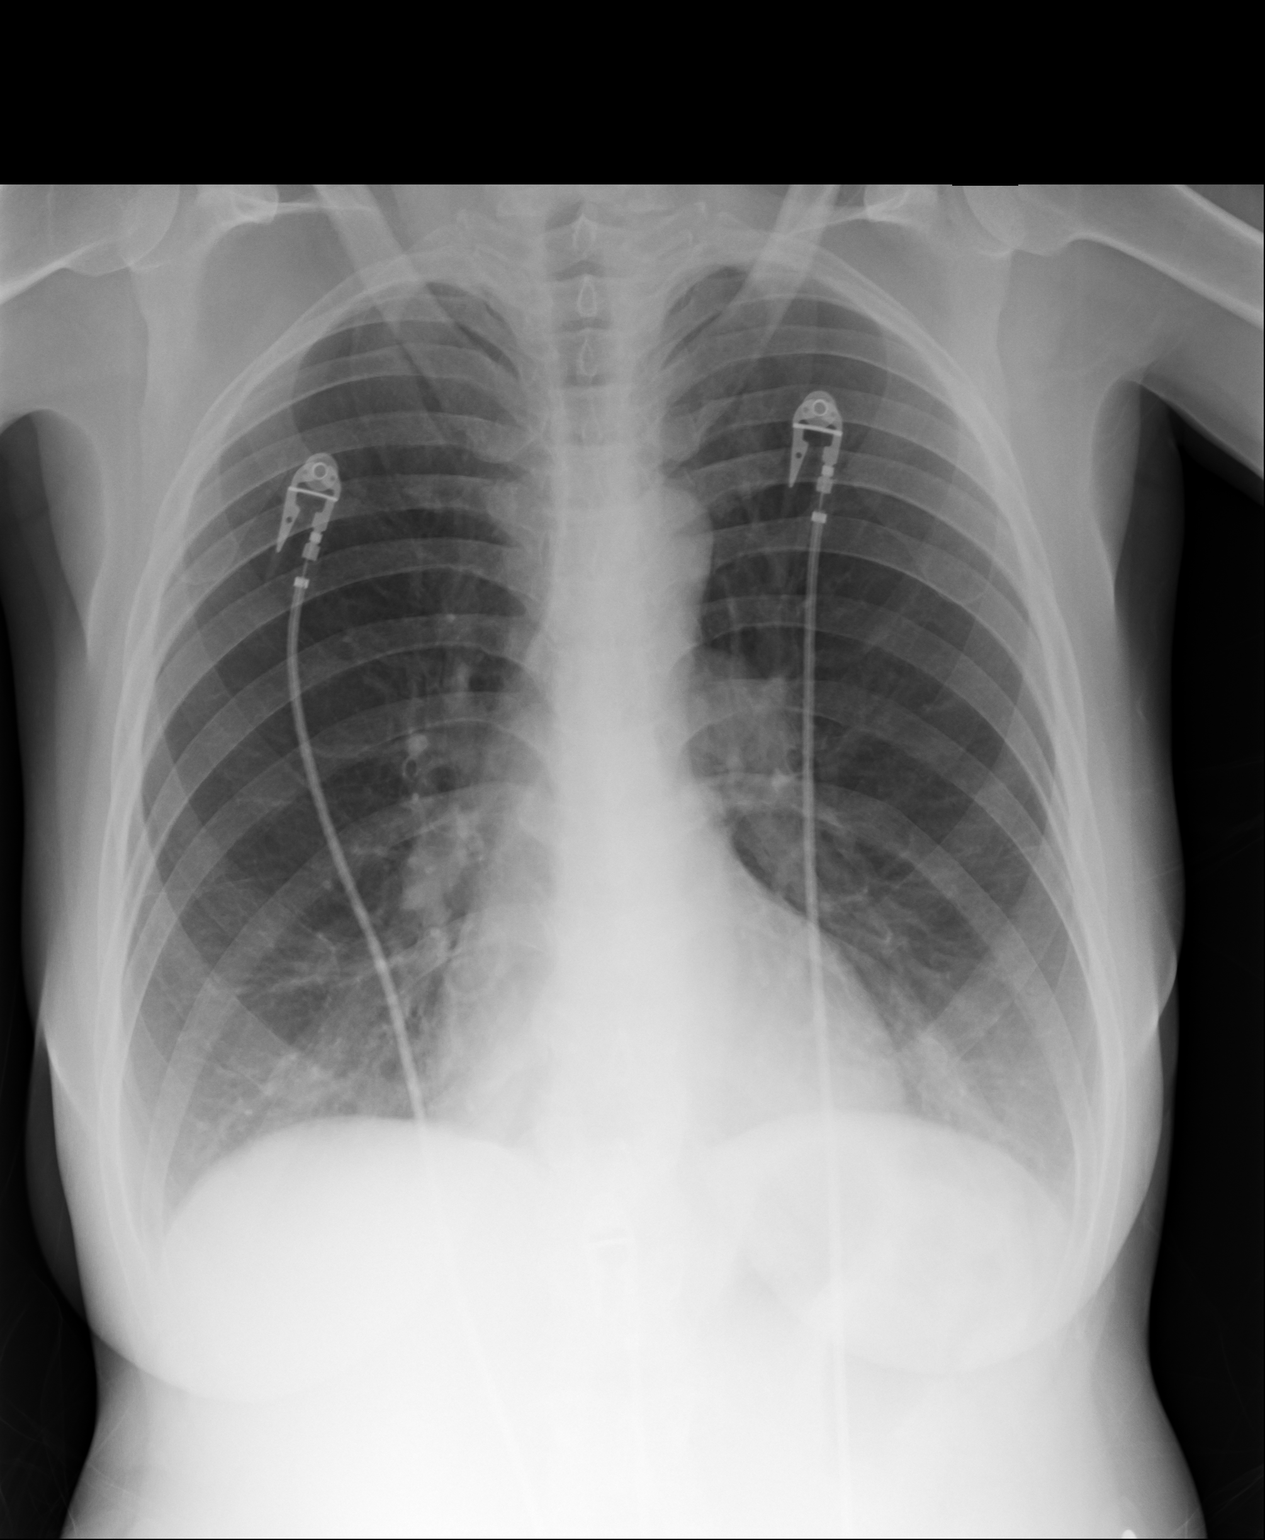

[1 of 1 positions shown; findings below may reference images not displayed]

IMPRESSION: No acute abnormality.

## 2013-06-28 ENCOUNTER — Inpatient Hospital Stay: Payer: Self-pay | Admitting: Internal Medicine

## 2013-06-28 LAB — CBC
HCT: 40.5 % (ref 35.0–47.0)
HGB: 13.3 g/dL (ref 12.0–16.0)
MCH: 28.7 pg (ref 26.0–34.0)
MCHC: 33 g/dL (ref 32.0–36.0)
MCV: 87 fL (ref 80–100)
Platelet: 222 10*3/uL (ref 150–440)
RBC: 4.65 10*6/uL (ref 3.80–5.20)
RDW: 13.4 % (ref 11.5–14.5)
WBC: 9.6 10*3/uL (ref 3.6–11.0)

## 2013-06-28 LAB — BASIC METABOLIC PANEL
ANION GAP: 5 — AB (ref 7–16)
BUN: 9 mg/dL (ref 7–18)
CALCIUM: 8.8 mg/dL (ref 8.5–10.1)
CREATININE: 0.74 mg/dL (ref 0.60–1.30)
Chloride: 102 mmol/L (ref 98–107)
Co2: 26 mmol/L (ref 21–32)
EGFR (Non-African Amer.): >60
Glucose: 119 mg/dL — ABNORMAL HIGH (ref 65–99)
Osmolality: 266 (ref 275–301)
Potassium: 3.3 mmol/L — ABNORMAL LOW (ref 3.5–5.1)
Sodium: 133 mmol/L — ABNORMAL LOW (ref 136–145)

## 2013-06-28 LAB — TROPONIN I: Troponin-I: <0.02 ng/mL

## 2013-06-29 LAB — CBC WITH DIFFERENTIAL/PLATELET
BASOS ABS: 0.1 10*3/uL (ref 0.0–0.1)
Basophil %: 0.6 %
EOS ABS: 0 10*3/uL (ref 0.0–0.7)
EOS PCT: 0 %
HCT: 38 % (ref 35.0–47.0)
HGB: 12.2 g/dL (ref 12.0–16.0)
LYMPHS ABS: 0.8 10*3/uL — AB (ref 1.0–3.6)
Lymphocyte %: 8.6 %
MCH: 28.4 pg (ref 26.0–34.0)
MCHC: 32.1 g/dL (ref 32.0–36.0)
MCV: 88 fL (ref 80–100)
MONO ABS: 0.3 x10 3/mm (ref 0.2–0.9)
Monocyte %: 3.6 %
Neutrophil #: 8.2 10*3/uL — ABNORMAL HIGH (ref 1.4–6.5)
Neutrophil %: 87.2 %
Platelet: 211 10*3/uL (ref 150–440)
RBC: 4.3 10*6/uL (ref 3.80–5.20)
RDW: 13.8 % (ref 11.5–14.5)
WBC: 9.4 10*3/uL (ref 3.6–11.0)

## 2013-06-29 LAB — BASIC METABOLIC PANEL
Anion Gap: 2 — ABNORMAL LOW (ref 7–16)
BUN: 10 mg/dL (ref 7–18)
CO2: 25 mmol/L (ref 21–32)
Calcium, Total: 8.5 mg/dL (ref 8.5–10.1)
Chloride: 109 mmol/L — ABNORMAL HIGH (ref 98–107)
Creatinine: 0.74 mg/dL (ref 0.60–1.30)
EGFR (Non-African Amer.): >60
GLUCOSE: 138 mg/dL — AB (ref 65–99)
Osmolality: 273 (ref 275–301)
POTASSIUM: 4.3 mmol/L (ref 3.5–5.1)
SODIUM: 136 mmol/L (ref 136–145)

## 2013-06-29 LAB — HEMOGLOBIN A1C: Hemoglobin A1C: 6 % (ref 4.2–6.3)

## 2013-07-03 LAB — CULTURE, BLOOD (SINGLE)

## 2013-07-21 ENCOUNTER — Inpatient Hospital Stay: Payer: Self-pay | Admitting: Family Medicine

## 2013-07-21 LAB — DRUG SCREEN, URINE
Amphetamines, Ur Screen: NEGATIVE (ref ?–1000)
BARBITURATES, UR SCREEN: NEGATIVE (ref ?–200)
Benzodiazepine, Ur Scrn: NEGATIVE (ref ?–200)
CANNABINOID 50 NG, UR ~~LOC~~: POSITIVE (ref ?–50)
Cocaine Metabolite,Ur ~~LOC~~: POSITIVE (ref ?–300)
MDMA (Ecstasy)Ur Screen: NEGATIVE (ref ?–500)
METHADONE, UR SCREEN: NEGATIVE (ref ?–300)
Opiate, Ur Screen: POSITIVE (ref ?–300)
PHENCYCLIDINE (PCP) UR S: NEGATIVE (ref ?–25)
TRICYCLIC, UR SCREEN: NEGATIVE (ref ?–1000)

## 2013-07-21 LAB — URINALYSIS, COMPLETE
BILIRUBIN, UR: NEGATIVE
Blood: NEGATIVE
Glucose,UR: NEGATIVE mg/dL (ref 0–75)
Hyaline Cast: 9
Leukocyte Esterase: NEGATIVE
Nitrite: NEGATIVE
Ph: 5 (ref 4.5–8.0)
Protein: NEGATIVE
RBC,UR: <1 /HPF (ref 0–5)
Specific Gravity: 1.024 (ref 1.003–1.030)
Squamous Epithelial: 5

## 2013-07-21 LAB — CBC
HCT: 48.9 % — ABNORMAL HIGH (ref 35.0–47.0)
HGB: 16.1 g/dL — AB (ref 12.0–16.0)
MCH: 29.2 pg (ref 26.0–34.0)
MCHC: 33 g/dL (ref 32.0–36.0)
MCV: 89 fL (ref 80–100)
Platelet: 276 10*3/uL (ref 150–440)
RBC: 5.52 10*6/uL — AB (ref 3.80–5.20)
RDW: 14.5 % (ref 11.5–14.5)
WBC: 5.8 10*3/uL (ref 3.6–11.0)

## 2013-07-21 LAB — BASIC METABOLIC PANEL
ANION GAP: 3 — AB (ref 7–16)
BUN: 10 mg/dL (ref 7–18)
Calcium, Total: 9 mg/dL (ref 8.5–10.1)
Chloride: 107 mmol/L (ref 98–107)
Co2: 28 mmol/L (ref 21–32)
Creatinine: 0.69 mg/dL (ref 0.60–1.30)
Glucose: 89 mg/dL (ref 65–99)
Osmolality: 274 (ref 275–301)
Potassium: 4 mmol/L (ref 3.5–5.1)
Sodium: 138 mmol/L (ref 136–145)

## 2013-07-21 LAB — TROPONIN I: Troponin-I: <0.02 ng/mL

## 2013-07-22 LAB — BASIC METABOLIC PANEL
Anion Gap: 6 — ABNORMAL LOW (ref 7–16)
BUN: 20 mg/dL — ABNORMAL HIGH (ref 7–18)
CALCIUM: 9.2 mg/dL (ref 8.5–10.1)
CREATININE: 0.98 mg/dL (ref 0.60–1.30)
Chloride: 103 mmol/L (ref 98–107)
Co2: 24 mmol/L (ref 21–32)
EGFR (Non-African Amer.): >60
Glucose: 154 mg/dL — ABNORMAL HIGH (ref 65–99)
OSMOLALITY: 272 (ref 275–301)
POTASSIUM: 3.9 mmol/L (ref 3.5–5.1)
SODIUM: 133 mmol/L — AB (ref 136–145)

## 2013-07-22 LAB — CBC WITH DIFFERENTIAL/PLATELET
Basophil #: 0 10*3/uL (ref 0.0–0.1)
Basophil %: 0.1 %
Eosinophil #: 0 10*3/uL (ref 0.0–0.7)
Eosinophil %: 0 %
HCT: 42.3 % (ref 35.0–47.0)
HGB: 13.9 g/dL (ref 12.0–16.0)
LYMPHS PCT: 10.2 %
Lymphocyte #: 0.8 10*3/uL — ABNORMAL LOW (ref 1.0–3.6)
MCH: 29.1 pg (ref 26.0–34.0)
MCHC: 32.8 g/dL (ref 32.0–36.0)
MCV: 89 fL (ref 80–100)
MONO ABS: 0.2 x10 3/mm (ref 0.2–0.9)
Monocyte %: 2.9 %
NEUTROS ABS: 6.6 10*3/uL — AB (ref 1.4–6.5)
NEUTROS PCT: 86.8 %
PLATELETS: 266 10*3/uL (ref 150–440)
RBC: 4.77 10*6/uL (ref 3.80–5.20)
RDW: 14.5 % (ref 11.5–14.5)
WBC: 7.6 10*3/uL (ref 3.6–11.0)

## 2013-07-22 LAB — MAGNESIUM: Magnesium: 2.2 mg/dL

## 2013-07-26 LAB — CULTURE, BLOOD (SINGLE)

## 2013-10-29 ENCOUNTER — Inpatient Hospital Stay: Payer: Self-pay | Admitting: Internal Medicine

## 2013-10-29 LAB — COMPREHENSIVE METABOLIC PANEL
ALT: 18 U/L (ref 12–78)
Albumin: 3.7 g/dL (ref 3.4–5.0)
Alkaline Phosphatase: 76 U/L
Anion Gap: 10 (ref 7–16)
BUN: 7 mg/dL (ref 7–18)
Bilirubin,Total: 0.5 mg/dL (ref 0.2–1.0)
CALCIUM: 9 mg/dL (ref 8.5–10.1)
Chloride: 101 mmol/L (ref 98–107)
Co2: 23 mmol/L (ref 21–32)
Creatinine: 0.71 mg/dL (ref 0.60–1.30)
EGFR (African American): >60
EGFR (Non-African Amer.): >60
Glucose: 148 mg/dL — ABNORMAL HIGH (ref 65–99)
OSMOLALITY: 269 (ref 275–301)
POTASSIUM: 3.2 mmol/L — AB (ref 3.5–5.1)
SGOT(AST): 14 U/L — ABNORMAL LOW (ref 15–37)
Sodium: 134 mmol/L — ABNORMAL LOW (ref 136–145)
Total Protein: 7.8 g/dL (ref 6.4–8.2)

## 2013-10-29 LAB — D-DIMER(ARMC): D-DIMER: 464 ng/mL

## 2013-10-29 LAB — CBC
HCT: 48.1 % — ABNORMAL HIGH (ref 35.0–47.0)
HGB: 15.4 g/dL (ref 12.0–16.0)
MCH: 27.9 pg (ref 26.0–34.0)
MCHC: 32.1 g/dL (ref 32.0–36.0)
MCV: 87 fL (ref 80–100)
Platelet: 184 10*3/uL (ref 150–440)
RBC: 5.54 10*6/uL — AB (ref 3.80–5.20)
RDW: 13.3 % (ref 11.5–14.5)
WBC: 12.2 10*3/uL — ABNORMAL HIGH (ref 3.6–11.0)

## 2013-10-29 LAB — DRUG SCREEN, URINE
AMPHETAMINES, UR SCREEN: NEGATIVE (ref ?–1000)
BARBITURATES, UR SCREEN: NEGATIVE (ref ?–200)
Benzodiazepine, Ur Scrn: NEGATIVE (ref ?–200)
COCAINE METABOLITE, UR ~~LOC~~: POSITIVE (ref ?–300)
Cannabinoid 50 Ng, Ur ~~LOC~~: POSITIVE (ref ?–50)
MDMA (Ecstasy)Ur Screen: NEGATIVE (ref ?–500)
METHADONE, UR SCREEN: NEGATIVE (ref ?–300)
Opiate, Ur Screen: NEGATIVE (ref ?–300)
PHENCYCLIDINE (PCP) UR S: NEGATIVE (ref ?–25)
TRICYCLIC, UR SCREEN: NEGATIVE (ref ?–1000)

## 2013-10-29 LAB — CK TOTAL AND CKMB (NOT AT ARMC)
CK, TOTAL: 90 U/L
CK-MB: 1.1 ng/mL (ref 0.5–3.6)

## 2013-10-29 LAB — PRO B NATRIURETIC PEPTIDE: B-TYPE NATIURETIC PEPTID: 291 pg/mL — AB (ref 0–125)

## 2013-10-29 LAB — TROPONIN I

## 2013-10-30 LAB — URINALYSIS, COMPLETE
BACTERIA: NONE SEEN
Bilirubin,UR: NEGATIVE
Glucose,UR: NEGATIVE mg/dL (ref 0–75)
Ketone: NEGATIVE
Leukocyte Esterase: NEGATIVE
Nitrite: NEGATIVE
PH: 6 (ref 4.5–8.0)
PROTEIN: NEGATIVE
SPECIFIC GRAVITY: 1.005 (ref 1.003–1.030)
Squamous Epithelial: NONE SEEN
WBC UR: NONE SEEN /HPF (ref 0–5)

## 2013-10-30 LAB — CBC WITH DIFFERENTIAL/PLATELET
BASOS PCT: 0 %
Basophil #: 0 10*3/uL (ref 0.0–0.1)
Eosinophil #: 0 10*3/uL (ref 0.0–0.7)
Eosinophil %: 0 %
HCT: 43 % (ref 35.0–47.0)
HGB: 13.6 g/dL (ref 12.0–16.0)
Lymphocyte #: 0.5 10*3/uL — ABNORMAL LOW (ref 1.0–3.6)
Lymphocyte %: 4.7 %
MCH: 27.7 pg (ref 26.0–34.0)
MCHC: 31.7 g/dL — AB (ref 32.0–36.0)
MCV: 88 fL (ref 80–100)
MONO ABS: 0.4 x10 3/mm (ref 0.2–0.9)
Monocyte %: 3.2 %
NEUTROS PCT: 92.1 %
Neutrophil #: 10.7 10*3/uL — ABNORMAL HIGH (ref 1.4–6.5)
Platelet: 170 10*3/uL (ref 150–440)
RBC: 4.92 10*6/uL (ref 3.80–5.20)
RDW: 13.7 % (ref 11.5–14.5)
WBC: 11.6 10*3/uL — ABNORMAL HIGH (ref 3.6–11.0)

## 2013-10-30 LAB — BASIC METABOLIC PANEL
ANION GAP: 5 — AB (ref 7–16)
BUN: 12 mg/dL (ref 7–18)
CALCIUM: 8.9 mg/dL (ref 8.5–10.1)
Chloride: 105 mmol/L (ref 98–107)
Co2: 26 mmol/L (ref 21–32)
Creatinine: 0.85 mg/dL (ref 0.60–1.30)
EGFR (African American): >60
EGFR (Non-African Amer.): >60
GLUCOSE: 137 mg/dL — AB (ref 65–99)
Osmolality: 274 (ref 275–301)
Potassium: 3.9 mmol/L (ref 3.5–5.1)
Sodium: 136 mmol/L (ref 136–145)

## 2013-10-30 LAB — MAGNESIUM: MAGNESIUM: 2.3 mg/dL

## 2013-10-30 LAB — PHOSPHORUS: PHOSPHORUS: 3.2 mg/dL (ref 2.5–4.9)

## 2013-10-31 LAB — BASIC METABOLIC PANEL
Anion Gap: 2 — ABNORMAL LOW (ref 7–16)
BUN: 22 mg/dL — ABNORMAL HIGH (ref 7–18)
CALCIUM: 8.7 mg/dL (ref 8.5–10.1)
Chloride: 104 mmol/L (ref 98–107)
Co2: 29 mmol/L (ref 21–32)
Creatinine: 0.98 mg/dL (ref 0.60–1.30)
EGFR (Non-African Amer.): >60
GLUCOSE: 125 mg/dL — AB (ref 65–99)
OSMOLALITY: 275 (ref 275–301)
Potassium: 4.7 mmol/L (ref 3.5–5.1)
Sodium: 135 mmol/L — ABNORMAL LOW (ref 136–145)

## 2013-10-31 LAB — CBC WITH DIFFERENTIAL/PLATELET
Basophil #: 0 10*3/uL (ref 0.0–0.1)
Basophil %: 0.3 %
Eosinophil #: 0 10*3/uL (ref 0.0–0.7)
Eosinophil %: 0 %
HCT: 41.1 % (ref 35.0–47.0)
HGB: 13.1 g/dL (ref 12.0–16.0)
LYMPHS PCT: 3.7 %
Lymphocyte #: 0.4 10*3/uL — ABNORMAL LOW (ref 1.0–3.6)
MCH: 28.1 pg (ref 26.0–34.0)
MCHC: 31.9 g/dL — ABNORMAL LOW (ref 32.0–36.0)
MCV: 88 fL (ref 80–100)
MONO ABS: 0.6 x10 3/mm (ref 0.2–0.9)
Monocyte %: 5.1 %
Neutrophil #: 10.6 10*3/uL — ABNORMAL HIGH (ref 1.4–6.5)
Neutrophil %: 90.9 %
Platelet: 193 10*3/uL (ref 150–440)
RBC: 4.66 10*6/uL (ref 3.80–5.20)
RDW: 13.5 % (ref 11.5–14.5)
WBC: 11.7 10*3/uL — ABNORMAL HIGH (ref 3.6–11.0)

## 2013-10-31 LAB — MAGNESIUM: MAGNESIUM: 2.6 mg/dL — AB

## 2013-10-31 LAB — PHOSPHORUS: PHOSPHORUS: 4.6 mg/dL (ref 2.5–4.9)

## 2013-11-01 LAB — CBC WITH DIFFERENTIAL/PLATELET
Basophil #: 0 10*3/uL (ref 0.0–0.1)
Basophil %: 0.1 %
EOS ABS: 0 10*3/uL (ref 0.0–0.7)
Eosinophil %: 0 %
HCT: 40.4 % (ref 35.0–47.0)
HGB: 12.7 g/dL (ref 12.0–16.0)
LYMPHS PCT: 5 %
Lymphocyte #: 0.4 10*3/uL — ABNORMAL LOW (ref 1.0–3.6)
MCH: 27.8 pg (ref 26.0–34.0)
MCHC: 31.5 g/dL — ABNORMAL LOW (ref 32.0–36.0)
MCV: 88 fL (ref 80–100)
Monocyte #: 0.6 x10 3/mm (ref 0.2–0.9)
Monocyte %: 6.4 %
NEUTROS ABS: 7.9 10*3/uL — AB (ref 1.4–6.5)
NEUTROS PCT: 88.5 %
PLATELETS: 215 10*3/uL (ref 150–440)
RBC: 4.58 10*6/uL (ref 3.80–5.20)
RDW: 13.3 % (ref 11.5–14.5)
WBC: 8.9 10*3/uL (ref 3.6–11.0)

## 2013-11-01 LAB — BASIC METABOLIC PANEL
Anion Gap: 1 — ABNORMAL LOW (ref 7–16)
BUN: 29 mg/dL — ABNORMAL HIGH (ref 7–18)
CO2: 29 mmol/L (ref 21–32)
CREATININE: 1.01 mg/dL (ref 0.60–1.30)
Calcium, Total: 8.9 mg/dL (ref 8.5–10.1)
Chloride: 106 mmol/L (ref 98–107)
EGFR (African American): >60
Glucose: 146 mg/dL — ABNORMAL HIGH (ref 65–99)
OSMOLALITY: 280 (ref 275–301)
Potassium: 4.1 mmol/L (ref 3.5–5.1)
SODIUM: 136 mmol/L (ref 136–145)

## 2013-11-02 LAB — EXPECTORATED SPUTUM ASSESSMENT W REFEX TO RESP CULTURE

## 2013-11-03 LAB — CULTURE, BLOOD (SINGLE)

## 2013-11-04 LAB — BASIC METABOLIC PANEL
Anion Gap: 6 — ABNORMAL LOW (ref 7–16)
BUN: 19 mg/dL — ABNORMAL HIGH (ref 7–18)
CO2: 32 mmol/L (ref 21–32)
Calcium, Total: 9 mg/dL (ref 8.5–10.1)
Chloride: 99 mmol/L (ref 98–107)
Creatinine: 0.66 mg/dL (ref 0.60–1.30)
EGFR (Non-African Amer.): >60
GLUCOSE: 110 mg/dL — AB (ref 65–99)
Osmolality: 277 (ref 275–301)
Potassium: 3.7 mmol/L (ref 3.5–5.1)
Sodium: 137 mmol/L (ref 136–145)

## 2013-11-04 LAB — HEMOGLOBIN: HGB: 15.4 g/dL (ref 12.0–16.0)

## 2014-07-07 ENCOUNTER — Emergency Department: Payer: Self-pay | Admitting: Emergency Medicine

## 2014-07-07 LAB — CBC
HCT: 44.3 % (ref 35.0–47.0)
HGB: 14.4 g/dL (ref 12.0–16.0)
MCH: 28.1 pg (ref 26.0–34.0)
MCHC: 32.4 g/dL (ref 32.0–36.0)
MCV: 87 fL (ref 80–100)
PLATELETS: 225 10*3/uL (ref 150–440)
RBC: 5.1 10*6/uL (ref 3.80–5.20)
RDW: 14.5 % (ref 11.5–14.5)
WBC: 9.6 10*3/uL (ref 3.6–11.0)

## 2014-07-07 LAB — BASIC METABOLIC PANEL
Anion Gap: 4 — ABNORMAL LOW (ref 7–16)
BUN: 10 mg/dL (ref 7–18)
CO2: 29 mmol/L (ref 21–32)
CREATININE: 0.81 mg/dL (ref 0.60–1.30)
Calcium, Total: 8.8 mg/dL (ref 8.5–10.1)
Chloride: 104 mmol/L (ref 98–107)
EGFR (Non-African Amer.): >60
Glucose: 97 mg/dL (ref 65–99)
Osmolality: 273 (ref 275–301)
Potassium: 3.7 mmol/L (ref 3.5–5.1)
Sodium: 137 mmol/L (ref 136–145)

## 2014-07-07 LAB — TROPONIN I: Troponin-I: <0.02 ng/mL

## 2014-07-09 LAB — COMPREHENSIVE METABOLIC PANEL
ANION GAP: 8 (ref 7–16)
Albumin: 3.7 g/dL (ref 3.4–5.0)
Alkaline Phosphatase: 89 U/L (ref 46–116)
BILIRUBIN TOTAL: 0.3 mg/dL (ref 0.2–1.0)
BUN: 13 mg/dL (ref 7–18)
CHLORIDE: 107 mmol/L (ref 98–107)
CREATININE: 0.92 mg/dL (ref 0.60–1.30)
Calcium, Total: 9.1 mg/dL (ref 8.5–10.1)
Co2: 26 mmol/L (ref 21–32)
EGFR (Non-African Amer.): >60
Glucose: 116 mg/dL — ABNORMAL HIGH (ref 65–99)
Osmolality: 282 (ref 275–301)
Potassium: 3.9 mmol/L (ref 3.5–5.1)
SGOT(AST): 22 U/L (ref 15–37)
SGPT (ALT): 20 U/L (ref 14–63)
SODIUM: 141 mmol/L (ref 136–145)
TOTAL PROTEIN: 8.4 g/dL — AB (ref 6.4–8.2)

## 2014-07-09 LAB — CBC
HCT: 45.1 % (ref 35.0–47.0)
HGB: 14.3 g/dL (ref 12.0–16.0)
MCH: 27.7 pg (ref 26.0–34.0)
MCHC: 31.7 g/dL — AB (ref 32.0–36.0)
MCV: 88 fL (ref 80–100)
PLATELETS: 276 10*3/uL (ref 150–440)
RBC: 5.15 10*6/uL (ref 3.80–5.20)
RDW: 14.5 % (ref 11.5–14.5)
WBC: 12.9 10*3/uL — AB (ref 3.6–11.0)

## 2014-07-09 LAB — CK TOTAL AND CKMB (NOT AT ARMC)
CK, Total: 73 U/L (ref 26–192)
CK-MB: 3.4 ng/mL (ref 0.5–3.6)

## 2014-07-09 LAB — PRO B NATRIURETIC PEPTIDE: B-TYPE NATIURETIC PEPTID: 380 pg/mL — AB (ref 0–125)

## 2014-07-09 LAB — TROPONIN I: Troponin-I: <0.02 ng/mL

## 2014-07-10 ENCOUNTER — Inpatient Hospital Stay: Payer: Self-pay | Admitting: Surgery

## 2014-07-11 LAB — BASIC METABOLIC PANEL
Anion Gap: 5 — ABNORMAL LOW (ref 7–16)
BUN: 18 mg/dL (ref 7–18)
CREATININE: 0.8 mg/dL (ref 0.60–1.30)
Calcium, Total: 8.9 mg/dL (ref 8.5–10.1)
Chloride: 104 mmol/L (ref 98–107)
Co2: 30 mmol/L (ref 21–32)
EGFR (Non-African Amer.): >60
GLUCOSE: 123 mg/dL — AB (ref 65–99)
Osmolality: 281 (ref 275–301)
POTASSIUM: 4.6 mmol/L (ref 3.5–5.1)
Sodium: 139 mmol/L (ref 136–145)

## 2014-07-11 LAB — CBC WITH DIFFERENTIAL/PLATELET
BASOS ABS: 0 10*3/uL (ref 0.0–0.1)
Basophil %: 0.3 %
Eosinophil #: 0 10*3/uL (ref 0.0–0.7)
Eosinophil %: 0.2 %
HCT: 38.9 % (ref 35.0–47.0)
HGB: 12.4 g/dL (ref 12.0–16.0)
Lymphocyte #: 0.9 10*3/uL — ABNORMAL LOW (ref 1.0–3.6)
Lymphocyte %: 7.3 %
MCH: 27.7 pg (ref 26.0–34.0)
MCHC: 31.8 g/dL — ABNORMAL LOW (ref 32.0–36.0)
MCV: 87 fL (ref 80–100)
MONOS PCT: 8.3 %
Monocyte #: 1 x10 3/mm — ABNORMAL HIGH (ref 0.2–0.9)
Neutrophil #: 10.1 10*3/uL — ABNORMAL HIGH (ref 1.4–6.5)
Neutrophil %: 83.9 %
Platelet: 251 10*3/uL (ref 150–440)
RBC: 4.46 10*6/uL (ref 3.80–5.20)
RDW: 13.9 % (ref 11.5–14.5)
WBC: 12.1 10*3/uL — ABNORMAL HIGH (ref 3.6–11.0)

## 2014-07-11 LAB — CULTURE, BLOOD (SINGLE)

## 2014-07-12 LAB — EXPECTORATED SPUTUM ASSESSMENT W REFEX TO RESP CULTURE

## 2014-07-22 ENCOUNTER — Ambulatory Visit: Payer: Self-pay | Admitting: Cardiothoracic Surgery

## 2014-08-03 ENCOUNTER — Ambulatory Visit
Admit: 2014-08-03 | Disposition: A | Discharge status: Home / Self Care | Payer: Self-pay | Attending: Cardiothoracic Surgery | Admitting: Cardiothoracic Surgery

## 2014-09-25 NOTE — H&P (Signed)
PATIENT NAME:  Michelle [name], Michelle [name] MR#:  [id number] DATE OF BIRTH:  12-02-1960  DATE OF ADMISSION:  06/28/2013  REFERRING PHYSICIAN: Lennette Bihari A. Orland, MD  FAMILY PHYSICIAN: [facility].   REASON FOR ADMISSION: Acute respiratory distress.   HISTORY OF PRESENT ILLNESS: The patient is a 54 year old female with a history of COPD/tobacco abuse and previous pneumonia. Also has a history of coronary artery disease, status post previous MI with stent placement. Presents to the Emergency Room today with a 2-day history of worsening shortness of breath associated with cough, chills and body aches. In the Emergency Room, the patient was noted to be tachycardic and hypoxic. Chest x-ray revealed pneumonia. She is now admitted for further evaluation.   PAST MEDICAL HISTORY: 1.  COPD/tobacco abuse.  2.  ASCVD, status post MI.   3.  Status post PTCA with stent placement.  4.  Hyperlipidemia.  5.  Benign hypertension.  6.  Remote history of cocaine abuse.  7.  History of multidrug-resistant pneumonia.   MEDICATIONS: 1.  Nitrostat 0.4 mg sublingually p.r.n. chest pain.  2.  Vasotec 2.5 mg p.o. daily.  3.  Coreg 3.125 mg p.o. b.i.d.  4.  Combivent 2 puffs q.6 hours p.r.n.  5.  Aspirin 81 mg p.o. daily.   ALLERGIES: No known drug allergies.   SOCIAL HISTORY: The patient still smokes intermittently. No history of alcohol abuse. Has a remote history of cocaine abuse but none in the past 2 years.   FAMILY HISTORY: Positive for diabetes and stroke. Negative for breast or colon cancer. Positive for coronary artery disease.   REVIEW OF SYSTEMS: CONSTITUTIONAL: The patient has had fever but no change in weight.  EYES: No blurred or double vision. No glaucoma.  ENT: No tinnitus or hearing loss. No nasal discharge or bleeding. No difficulty swallowing.  RESPIRATORY: No hemoptysis. No painful respiration.  CARDIOVASCULAR: No chest pain or orthopnea. No palpitations or syncope.  GASTROINTESTINAL: Some nausea  but no vomiting or diarrhea. No abdominal pain or change in bowel habits.  GENITOURINARY: No dysuria or hematuria. No incontinence.  ENDOCRINE: No polyuria or polydipsia. No heat or cold intolerance.  HEMATOLOGIC: The patient denies anemia, easy bruising or bleeding.  LYMPHATIC: No swollen glands.  MUSCULOSKELETAL: The patient has pain in her neck, back, shoulders, knees or hips. No gout.  NEUROLOGIC: No numbness or migraines. Denies stroke or seizures.  PSYCHIATRIC: The patient denies anxiety, insomnia or depression.   PHYSICAL EXAMINATION: GENERAL: The patient is in moderate respiratory distress.  VITAL SIGNS: Currently remarkable for a blood pressure of 154/70 with a heart rate of 101, respiratory rate of 24, temperature of 98.8. Sats 89% on room air.  HEENT: Normocephalic, atraumatic. Pupils equally round, reactive to light and accommodation. Extraocular movements are intact. Sclerae are anicteric. Conjunctivae are clear. Oropharynx is clear.  NECK: Supple without JVD or bruits. No adenopathy or thyromegaly is noted.  LUNGS: Revealed scattered wheezes and rhonchi. Dullness at the left base. No rales. Respiratory effort is increased.  CARDIAC: Rapid rate with a regular rhythm. Normal S1 and S2. No significant rubs, murmurs or gallops. PMI is nondisplaced. Chest wall is nontender.  ABDOMEN: Soft, nontender, with normoactive bowel sounds. No organomegaly or masses were appreciated. No hernias or bruits were noted.  EXTREMITIES: Without clubbing, cyanosis or edema. Pulses were 2+ bilaterally.  SKIN: Warm and dry without rash or lesions.  NEUROLOGIC: Cranial nerves II through XII grossly intact. Deep tendon reflexes were symmetric. Motor and sensory examination is nonfocal.  PSYCHIATRIC: Exam revealed a patient who is alert and oriented to person, place and time. She was cooperative and used good judgment.   LABORATORY, DIAGNOSTIC AND RADIOLOGICAL DATA:  Chest x-ray revealed left lower lobe  pneumonia, COPD changes were also there. Glucose was 119, with a BUN of 9, creatinine 0.74 with a sodium of 133 and a potassium of 3.3. Troponin less than 0.02. White count was 9.6 with a hemoglobin of 13.3.   ASSESSMENT: 1.  Left lower lobe pneumonia.  2.  Chronic obstructive pulmonary disease exacerbation.  3.  Acute respiratory distress.  4.  Atherosclerotic cardiovascular disease.  5.  Hypokalemia.  6.  Hyponatremia.  7.  Benign hypertension.  8.  Hyperlipidemia.  9.  Remote history of substance abuse.   PLAN: The patient will be admitted to the floor with telemetry because of her heart history. We will continue her aspirin, Coreg and Vasotec. We will begin IV steroids with IV antibiotics and DuoNeb SVNs. We will supplement oxygen and wean as tolerated. We will follow her sugars while on steroids. Supplement potassium and sodium. Follow up routine labs in the morning. Repeat chest x-ray in the morning. Low-fat, low-cholesterol diet for now. Further treatment and evaluation will depend upon the patient's progress.   TOTAL TIME SPENT ON THIS PATIENT: 50 minutes.    ____________________________ Leonie Douglas Doy Hutching, MD jds:cs D: 06/28/2013 12:19:00 ET T: 06/28/2013 14:43:53 ET JOB#: [id number]  cc: Leonie Douglas. Doy Hutching, MD, <Dictator> [facility] Marquitta Persichetti Lennice Sites MD ELECTRONICALLY SIGNED 06/28/2013 17:00

## 2014-09-25 NOTE — Discharge Summary (Signed)
PATIENT NAME:  Michelle [name], LUI MR#:  [id number] DATE OF BIRTH:  05/05/1961  DATE OF ADMISSION:  06/28/2013 DATE OF DISCHARGE:  07/01/2013  PRIMARY CARE PHYSICIAN:  Dr. Einar Pheasant.  DISCHARGE DIAGNOSES: 1.  Pneumonia.  2.  Chronic obstructive pulmonary disease exacerbation.  3.  Acute respiratory failure due to pneumonia and chronic obstructive pulmonary disease exacerbation.  4.  Hypokalemia.  5.  Hypertension.   DISCHARGE MEDICATIONS:  1.  Proventil 90 mcg inhalation 2 puffs 4 times daily as needed for wheezing.  2.  Tussionex 5 mL every 12 hours as needed for cough, dispensed only 50 mL.  3.  Azithromycin 500 mg daily for 5 days.  4.  Spiriva 18 mcg inhalation daily.  5.  Enalapril 5 mg daily.  6.  Prednisone 20 mg 3 tablets daily for 2 days, 2 tablets daily for 2 days, 1 tablet daily for 2 days.  7.  Augmentin 875 mg 1 tablet p.o. b.i.d. for 7 days.  8.  Coreg.3.125 mg po bid  DIET: Low-sodium diet.   CONSULTATIONS: None.   HOSPITAL COURSE: This is a 54 year old female patient admitted on January 25th because of acute respiratory distress. The patient has a history of tobacco abuse, COPD and previous pneumonia. She also had history of coronary artery disease and stent placement. Came in because of shortness of breath, cough and chills. Chest x-ray showed pneumonia on admission. The patient takes Coreg at home, along with Combivent and Nitrostat. The patient admitted for  hospitalist service for pneumonia. She was on IV antibiotics with Rocephin and Zithromax along with DuoNebs and also IV steroids. The patient's O2 sats on admission were 89% on room air and her breathing improved, and then the patient also does not have any more wheezing. She is afebrile, and her oxygen saturation today is 93% on room air. The patient feels much better. We repeated chest x-ray on 26th; showed COPD and density in the left base improved. The patient's white count on admission  9.6, but blood cultures  have been negative, and the patient feels better today, so she will with go with Augmentin and Zithromax to finish the course, and also gave a prescription for tapering the prednisone. Advised her to quit smoking and restarted her Spiriva here. She also has Proventil at home. She can continue. The patient is stable.   History of coronary artery disease. The patient had a stent before. Not on Plavix, but she is on aspirin and Coreg, and the patient can continue her aspirin and Coreg.   She is also on Enalapril 2.5 mg daily; that can be continued. The patient has hypertension, so  she can continue her ACE inhibitors and Coreg.  She can continue her Enalapril, but we increased that to 5 mg daily because her blood pressure has been a little bit up.  For cough, she can take Tussionex.   TIME SPENT ON DISCHARGE PREPARATION: More than 30 minutes.    ____________________________ Epifanio Lesches, MD sk:dmm D: 07/01/2013 11:53:21 ET T: 07/01/2013 12:22:43 ET JOB#: [id number]  cc: Epifanio Lesches, MD, <Dictator> Einar Pheasant, MD Epifanio Lesches MD ELECTRONICALLY SIGNED 07/13/2013 15:22

## 2014-09-25 NOTE — H&P (Signed)
PATIENT NAME:  Michelle [name], FRASCO MR#:  [id number] DATE OF BIRTH:  06-04-1961  DATE OF ADMISSION:  10/29/2013  PRIMARY CARE PHYSICIAN: Dr. Einar Pheasant.   CHIEF COMPLAINT: Shortness of breath.   HISTORY OF PRESENT ILLNESS: This is a 54 year old female who presents to the Emergency Room due to shortness of breath, progressively getting worse over the past few days. The patient currently is quite somnolent and in severe respiratory distress therefore cannot give a history, therefore most of the history obtained from the ER physician and from the chart. The patient says that she has been having shortness of breath now for the past few days. She was noted to be in acute respiratory failure. Upon arrival to the Emergency Room she was urgently placed on BiPAP. She then became quite agitated and was pulling off the mask and was given a total of 2 mg of Ativan and is currently very somnolent and appears to be in severe respiratory distress. Hospitalist services were contacted for further treatment and evaluation.   REVIEW OF SYSTEMS: Otherwise unobtainable given the patient's mental status and her respiratory failure.   PAST MEDICAL HISTORY:  1. Consistent with chronic obstructive pulmonary disease with ongoing tobacco abuse.  2. Hypertension.  3. History of coronary artery disease.  4. History of previous MIs.  5. Hyperlipidemia.  6. History of cocaine abuse, tobacco abuse.   PAST SURGICAL HISTORY: Consistent with status post stent after cardiac catheterization.   ALLERGIES: No known drug allergies.   SOCIAL HISTORY: She does smoke about 1/2 pack per day, has smoked for the past 40 years. No alcohol abuse. She does have a history of cocaine abuse.   FAMILY HISTORY: Positive for diabetes and stroke in her parents and coronary disease in her parents as well.   CURRENT MEDICATIONS: Unobtainable, but this is based off her previous discharge summaries as follows: Albuterol inhaler 2 puffs 4 times daily  as needed, enalapril 5 mg daily, Coreg 3.125 mg b.i.d., Advair 250 one puffs b.i.d., Spiriva 1 puff daily.   PHYSICAL EXAMINATION: Presently is as follows:  VITAL SIGNS: Are noted to be temperature is 98.5, pulse 115, respirations 32, blood pressure 140/99, sats 97% on BiPAP. GENERAL:  She is a lethargic-appearing female, nonverbal in mild to moderate respiratory distress, critically ill-appearing.  HEAD, EYES, EARS, NOSE, THROAT: Atraumatic, normocephalic. Her pupils are equal and reactive eye to light. Her sclerae anicteric. No conjunctival injection. No pharyngeal erythema.  NECK: Supple. There is no jugular venous distention. No bruits, lymphadenopathy or thyromegaly.  HEART: Tachycardic. No murmurs. No rubs, no clicks.  LUNGS: She has coarse, diffuse wheezing and rhonchi bilaterally, positive use of accessory muscles, tachypnea.  ABDOMEN: Soft, flat, nontender, nondistended. Has good bowel sounds. No hepatosplenomegaly appreciated.  EXTREMITIES: No evidence of any cyanosis, clubbing, or peripheral edema. Has +2 pedal and radial pulses bilaterally.  NEUROLOGICAL: The patient is alert, but lethargic, difficult to do a full neurological exam. She was agitated earlier, moves all extremities spontaneously. No other focal motor sensory deficits bilaterally.  SKIN: Moist and warm with no rashes.  LYMPHATIC: There is no cervical or axillary adenopathy.   LABORATORY DATA: Showed a serum glucose of 148, BUN 7, creatinine 0.7, sodium 134, potassium 3.2, chloride 101, bicarbonate 23. LFTs within normal limits. Troponin less than 0.02. White cell count 12.2, hemoglobin 15.4, hematocrit 48.1, platelet count 184. The patient's ABG showed a pH of 7.35, pCO2 of 41, pO2 of 67, sats are 95% on BiPAP.   The patient  did have a chest x-ray done which showed possible acute infiltrate in the right lung base, underlying chronic obstructive pulmonary disease.   ASSESSMENT AND PLAN: This is a 54 year old female with a  history of chronic obstructive pulmonary disease with ongoing tobacco abuse, hypertension, history of polysubstance abuse, history of coronary disease and myocardial infarction, who presents to the hospital due to shortness of breath and noted to be in acute respiratory failure.  1. Acute respiratory failure. This is likely acute on chronic respiratory failure secondary to chronic obstructive pulmonary disease exacerbation. The patient was urgently placed on BiPAP and still remains very somnolent and repeat ABG does not show any significant hypercarbia. For now, we will treat her respiratory failure with IV steroids, around-the-clock nebulizer treatments. Continue Advair, Spiriva for her chronic obstructive pulmonary disease. We will also give her  Levaquin for underlying pneumonia. Follow serial ABGs and follow clinically. We will consider getting a pulmonary consult. The patient is high risk for a possibly being intubated.  2. Chronic obstructive pulmonary disease exacerbation. This is likely the cause of the patient's acute respiratory failure, likely due to pneumonia and ongoing tobacco abuse. I will treat the patient with IV steroids, around-the-clock nebulizer treatments. Continue Advair, Spiriva and Levaquin. Follow serial ABGs. Follow sputum and blood cultures and follow her clinically. Continue BiPAP support for now.  3. Hypertension. The patient is currently normotensive. Hold antihypertensives for now.  4. History of polysubstance abuse. I will get a urine drug screen. The patient has a history of cocaine abuse.  5. Hypokalemia. I will replace her potassium accordingly and repeat in the morning.   CODE STATUS: The patient is a full code.   CRITICAL CARE TIME SPENT: 50 minutes.    ____________________________ Belia Heman. Verdell Carmine, MD vjs:sg D: 10/29/2013 10:02:02 ET T: 10/29/2013 10:19:37 ET JOB#: [id number]  cc: Belia Heman. Verdell Carmine, MD, <Dictator> Henreitta Leber MD ELECTRONICALLY SIGNED  11/07/2013 21:54

## 2014-09-25 NOTE — H&P (Signed)
PATIENT NAME:  Michelle [name], Michelle [name] MR#:  [id number] DATE OF BIRTH:  03-18-1961  DATE OF ADMISSION:  07/21/2013  REFERRING PHYSICIAN: Conni Slipper.   PRIMARY CARE PHYSICIAN:  Radio producer.  CHIEF COMPLAINT: Increased shortness of breath.   HISTORY OF PRESENT ILLNESS: This is a 54 year old female with history of COPD, tobacco abuse, previous admissions for pneumonias, coronary artery disease, status post MI. She also had a stent placement, and she also has history of cocaine abuse.   The patient states that after being her normal self yesterday, at night, she went to bed and could not sleep because of increase of cough and increase of secretions. She started getting shortness of breath and wheezing that did not improve with her inhaler, which is Combivent.   The patient states that she has not had any fever, but she was feeling really tired and a little bit hot. The patient had a temperature of 97.8. She was breathing up to 24 or more times a minute, and on room air she desaturated to 88%, for which she was put on supplemental oxygen.   Chest x-ray was done showing an infiltrate.   The patient is admitted for treatment of COPD exacerbation with pneumonia.   REVIEW OF SYSTEMS:  Twelve-system  review of systems is done.  CONSTITUTIONAL: Denies any fever, although she felt warm. Positive fatigue. Positive weakness. No weight loss or weight gain.  EYES: No blurry vision, double vision.  EARS, NOSE, THROAT: No tinnitus or difficulty swallowing.  RESPIRATORY: Positive cough. Positive wheezing. No hemoptysis. Positive dyspnea. Positive COPD.  Positive smoking. Smoking cessation counseling given to the patient for over 4 minutes, and the patient states that she would love to quit smoking, but she has been smoking for 40 years, and she does not feel like she is able. We are going to offer her nicotine patches for now.  GASTROINTESTINAL: No nausea, vomiting, abdominal pain, constipation, diarrhea.   CARDIOVASCULAR: No chest pain, orthopnea or syncope. Positive previous MI, likely related to cocaine use.  GENITOURINARY: No dysuria, hematuria, changes in frequency.  GYNECOLOGIC:  No breast masses.  ENDOCRINOLOGY: No polyuria or polydipsia.  HEMATOLOGIC AND LYMPHATIC: No anemia or easy bruising.  SKIN: No rash.  MUSCULOSKELETAL: No neck pain, back pain.  NEUROLOGIC: No numbness. No tingling. No TIAs.   PSYCHIATRIC: No insomnia or depression.   PAST MEDICAL HISTORY: 1.  COPD.  2.  Tobacco abuse.  3.  Coronary artery disease.  4.  Status post MI.  5.  Hyperlipidemia.  6.  Hypertension.  7.  Cocaine abuse, current.  8.  History of previous multidrug-resistant pneumonia.   PAST SURGICAL HISTORY: Status post stent in the past after cardiac cath.   MEDICATIONS: Nitroglycerin 0.4 mg sublingual as needed, Vasotec 2.5 mg daily, Coreg 3.125 mg twice daily, Combivent 2 puffs q.6 hours as needed, aspirin 81 mg daily.   ALLERGIES: No known drug allergies.   SOCIAL HISTORY: The patient smokes 1/2 pack a day for the last 40 years. She denies any alcohol use. She says the last time she used cocaine was last Friday. She lives by herself.   FAMILY HISTORY: Positive for diabetes and a stroke in her parents and coronary artery disease in her parents as well.   PHYSICAL EXAMINATION: VITAL SIGNS: Blood pressure of 163/92, pulse 98, respirations up to 24, temperature 97.8.  GENERAL: The patient is alert, oriented x 3, mild respiratory distress with occasional use of accessory muscles whenever she talks.   HEENT: Pupils  are equal, round and reactive. Extraocular movements are intact. Mucosa moist. Anicteric sclerae. Pink conjunctivae. No oral lesions.  NECK: Supple. No JVD. No thyromegaly. No adenopathy. No carotid bruits.  CARDIOVASCULAR: Regular rate and rhythm. No murmurs, rubs or gallops. Slightly tachycardic.  RESPIRATORY: Positive wheezing bilateral lung fields with some rales on the left base,  not tubular sounds.  ABDOMEN: Soft, nontender, nondistended. No hepatosplenomegaly. No masses. Bowel sounds are positive.  GENITAL: Exam is deferred.  EXTREMITIES: No edema, cyanosis or clubbing. Pulses +2. Capillary refill less than 2.  MUSCULOSKELETAL: No joint effusions or joint swelling.  SKIN: No rashes, petechiae.  PSYCHIATRIC: No significant agitation. Alert and oriented x 3.  NEUROLOGIC: Cranial nerves II through XII intact. The patient has no focal findings.   LABORATORY DATA:  Glucose 89, creatinine 0.69.  Other electrolytes were within normal limits. Troponin is negative. White count is 5.8. Hemoglobin is 16.   CHEST X-RAY: Left lower lobe infiltrate.     EKG: No ST depression or elevation. Normal sinus rhythm, slightly tachycardic.   ASSESSMENT AND PLAN: A 54 year old female with history of:  1.  Previous pneumonias, which have been multidrug-resistant in the past.  At this moment, we are going to treat her with empiric antibiotics. She has been recently hospitalized, for which we are going to treat it as healthcare-acquired pneumonia. We are going to give her 1 dose of vancomycin and decide tomorrow if she is still going to need more, but so far, we are going to add Zosyn and Levaquin, as again, she has history of recent hospitalization and also a history of multidrug-resistant organisms.   Continue pulmonary toilet, nebs around-the-clock and early ambulation if possible.   Follow sputum cultures and blood cultures.   2. Chronic obstructive pulmonary disease with exacerbation. At this moment, the patient is going to be on low-dose of steroids. Continue nebulizers.  3.  Acute respiratory failure. The patient is on 2 liters of oxygen after she was down to 88%. No other interventions at this moment for that.  4. Hypertension. The patient has uncontrolled hypertension. She has not been taking her medications, since she has been using cocaine. Because of that, I am going to hold her  beta blocker until I have a UDS daily. If the UDS is positive, I will definitely keep the beta blocker on hold, but since she has history of coronary artery disease, I will start the beta blocker if cocaine is negative. Coreg is most well-tolerated in that case.  This is due to her alpha and beta agonist effect.    5. Coronary artery disease. Continue aspirin for now.   No acute coronary syndrome signs or symptoms at this moment.   Other medical problems seem to be stable. DVT prophylaxis with Lovenox. GI prophylaxis with Protonix.   I spent about 45 minutes with this admission.    ____________________________ [place] Sink, MD rsg:dmm D: 07/21/2013 12:45:54 ET T: 07/21/2013 13:22:54 ET JOB#: [id number]  cc: [place] Sink, MD, <Dictator> [place] MD ELECTRONICALLY SIGNED 07/25/2013 20:38

## 2014-09-25 NOTE — Discharge Summary (Signed)
PATIENT NAME:  Michelle [name], BALLESTER MR#:  [id number] DATE OF BIRTH:  [birth year]  DATE OF ADMISSION:  10/29/2013 DATE OF DISCHARGE:  11/05/2013  ADMITTING DIAGNOSIS: Acute respiratory failure.   DISCHARGE DIAGNOSES:  1. Acute on chronic respiratory failure, now oxygen dependent, status post intubation and intubation on 10/29/2013, extubation on 11/02/2013.  2. Ovarian cyst ablation.   3. Left lower lobe pneumonia.  4. Polysubstance abuse, cocaine, marijuana, as well as tobacco. 5. Hypokalemia. 6. Dysphagia after extubation.  7. A history of hypertension.   DISCHARGE CONDITION: Stable.   DISCHARGE MEDICATIONS:  1. Patient is to resume Proventil 2 puffs 4 times daily as needed. 2. Enalapril 5 mg daily. 3. Carvedilol 3.125 mg twice daily. 4. Fluticasone formoterol  250/50 one puff twice daily.  5. Tiotropium 1 inhalation once daily.  6. Prednisone 60 mg p.o. once on 11/06/2013 and then taper by 10 mg every 2 days until stopped.  7. Habitrol 14 mg transdermally daily.  8. Nicotine inhalation device 1 inhalation 12 times daily as needed.  9. Alprazolam 0.5 mg tablets, half tablet which will be 0.25 mg every 8 hours as needed.  10. Ipratropium 0.5 mg in 2.5 mL inhalation solution, 1 inhalation 6 times daily as needed.  11. Levalbuterol 1.25 mg nebulizer 6 times daily as needed.  12. Colace 100 mg p.o. twice daily as needed.  13. Tussionex 5 mL twice daily as needed.  14. Guaifenesin 600 mg twice daily as needed.   She is not to take levofloxacin.  HOME OXYGEN: None.   DIET: Low salt, low fat, low cholesterol, mechanical soft.   ACTIVITY LIMITATIONS: As tolerated.   REFERRAL: To home health physical therapy.   FOLLOWUP:  Dr. Einar Pheasant in 2 days after discharge, Dr. Raul Del in 1 week after discharge.   CONSULTANTS: Care management, social work, Dr. Raul Del, Dr. Isaiah Blakes.   RADIOLOGIC STUDIES: Chest x-ray, portable single view, 10/29/2013, showing underlying COPD with possible acute  infiltrate in the right lung base. Chest, portable single view, 10/29/2013, after intubation revealed support devices in the expected position. No pneumothorax, medial left basilar atelectasis or infiltrate.   Portable single view, chest x-ray, 10/30/2013, reveals tube and catheter position without pneumothorax, underlying emphysematous changes, mild scarring in left mid lung. No edema or consolidation.   Repeat x-ray, portable single view, 11/02/2013, showed tube and catheter positions as described without pneumothorax, underlying emphysematous changes, no edema or consolidation.  Chest x-ray, 1 view, 11/04/2013, revealed hyperinflation consistent with COPD, no evidence of pneumonia or CHF. CT scan of chest without contrast, 11/04/2013, revealed minimal subsegmental atelectasis  or  consolidation in the medial basal segment in left lower lobe, atherosclerosis including aortic and coronary artery disease.  Lung VQ scan, 11/04/2013, showing extensive mask ventilation and perfusion abnormalities bilaterally, intermediate  likelihood ratio for pulmonary embolism.   Doppler ultrasound of bilateral lower extremities, 11/05/2013, revealed no deep vein thromboses in bilateral lower extremities.   HOSPITAL COURSE: The patient is a 54 year old African American female with history of ongoing tobacco abuse, who presents to the hospital with complaints of shortness of breath. Please refer to Dr. Edward Jolly admission on 10/29/2013. On arrival to the hospital, the patient's temperature was 98.5, pulse 115, respiratory rate was 32, blood pressure 140/99, saturation was 97% on BiPAP. Her physical exam revealed diffuse wheezing as well as rhonchi bilaterally as well as positive  use of accessory muscles and tachypnea.   The patient's initial labs in the Emergency Room on 10/29/2013, revealed a beta-type  natriuretic peptide of 291, glucose 148, sodium 134, potassium 3.2, otherwise BMP was unremarkable. The patient's liver enzymes  were unremarkable. The patient's cardiac enzymes x 1 was normal. Urine drug screen was positive for cocaine as well as cannabinoids. CBC: White blood cell count was 12.2, hemoglobin was 15.4, platelet count 184,000.  Absolute neutrophil count was not checked. D-dimer was 464. Blood cultures taken on 10/29/2013 showed no growth. Urinalysis was unremarkable. ABGs were done on 30% FiO2 through BiPAP on 10/29/2013, showed a pH of 7.38, pCO2 was 39, pO2 was 72, saturation was 96.2%.  However, patient's ABGs worsened and she became more and more acidotic and hypoxic. She was managed on BiPAP initially; however, intubated later in the day and placed on mechanical ventilation. EKG was remarkable for sinus tachycardia at 116 beats per minute, premature atrial complexes, right bundle branch block, and  no other acute ST-T changes were noted.   The patient was initiated on antibiotic therapy as well as steroids, inhalation  nebulizers. She was managed on mechanical ventilation through 11/02/2013 and was extubated. Post extubation, she did relatively well except for mild dysphagia which she experienced post extubation. She was continued on conservative management with steroids, inhalation therapy, nebulizers.  A speech therapist saw her in consultation and followed her along. She overall improved significantly, however, still required oxygen therapy. She was noncompliant with her DuoNebs, stating that her DuoNebs made her very jittery and very uncomfortable. Her DuoNebs were changed to [place] as well as ipratropium with improvement of her overall condition. She was evaluated for oxygen therapy at home and felt that she is not candidate for oxygen therapy since her oxygen saturation was 94% on room air on exertion. The patient was advised to continue steroid taper, as well as inhalation therapy. She finished her course of Levaquin by the day of discharge.    She was advised to follow up with Dr. Raul Del in the next few days  after discharge. In regard to COPD exacerbation as mentioned above, patient was advised to continue steroid taper, as well as inhalation nebulizing therapy. She was also counseled about tobacco abuse and recommended to quit. Nicotine replacement therapy was initiated for her upon discharge. In regard to left lower lobe pneumonia, patient finished antibiotic therapy with Levaquin. For the polysubstance abuse, she was counseled. She was recommended substance abuse program at [facility] unit. In regard to dysphagia, patient is to continue a soft diet which is to be advanced as tolerated to regular diet. For history of hypertension, the patient is to continue her outpatient medications.   On the day of discharge, the patient felt satisfactory, did not complain of any significant discomfort. Her vitals were stable with temperature of 98.3, pulse was 75, respiratory rate was 18, blood pressure 139/88, saturation was 93% on room air at rest as well as on exertion.   TIME SPENT: 40 minutes on this patient.    ____________________________ Theodoro Grist, MD rv:dd D: 11/05/2013 19:14:00 ET T: 11/06/2013 04:18:57 ET JOB#: [id number]  cc: Einar Pheasant, MD Dr. Dionne Bucy, MD, <Dictator>      [place] MD ELECTRONICALLY SIGNED 11/25/2013 14:41

## 2014-09-25 NOTE — Discharge Summary (Signed)
PATIENT NAME:  Michelle [name], Michelle [name] MR#:  [id number] DATE OF BIRTH:  1961-03-26  DATE OF ADMISSION:  07/21/2013  DATE OF DISCHARGE:  07/24/2013  REASON FOR ADMISSION: Increased shortness of breath.   DISCHARGE DIAGNOSES: 1.  Healthcare-acquired pneumonia.  2.  Chronic obstructive pulmonary disease with acute exacerbation.  3.  Acute respiratory failure.  4.  Hypertension.  5.  Coronary artery disease.  6.  Cocaine use.   MEDICATIONS AT DISCHARGE: Proventil 2 puffs 4 times a day, enalapril 5 mg once daily, Coreg 3.125 mg twice daily, prednisone taper starting on 40 mg, decreasing 10 mg a day for the next 4 days, Advair 250/50 twice daily, Spiriva 18 mcg once daily, levofloxacin 750 mg every 24 hours, nicotine 21 mg in 24 hours, transdermal patch, decrease every 7 days until they are gone.   FOLLOW UP: With Dr. Einar Pheasant in the next 1 to 2 weeks.   HOSPITAL COURSE:  A very nice, 54 year old female with history of previous admission due to pneumonia. She has COPD, tobacco abuse, previous admissions for pneumonias, also coronary artery disease, status post MI and a stent placement. She uses cocaine. The patient states that she was her normal self, and started having increased shortness of breath. She did not have any fever. Her temperature was 97.8, and oxygen saturation was down to 88%. She decided for what she was admitted for treatment of this condition.   As far as her pneumonia, the patient was admitted with healthcare-acquired pneumonia, as she met criteria based on previous hospitalizations. At this time, the patient was treated with empiric antibiotics. On my H and P, it said that the patient had multidrug-resistant bacteria in the past, but this is a mistake. The patient actually did not have any cultures positive for any bacteria that we can tell of, although the patient is at high risk for multidrug-resistant bacteria due to previous antibiotics and steroids given to the patient. Again on  my H and P it should say high risk of multidrug-resistant bacteria, but she has never had a diagnosis of it.   The patient was started on Zosyn, Levaquin, and vancomycin, and the patient had improvement within 3 days. As far as her COPD, the patient has low-dose of steroids given to her, and nebulizers, and she improved slowly, but at the end she was able to be weaned off oxygen.   Acute respiratory failure. The patient was on 2 liters of oxygen. She was tachypneic and using accessory muscles whenever she was talking, and again only required 2 liters to correct her 88%. It resolved within the first 24 hours.  As far as her hypertension, it was uncontrolled, but this was also due to the cocaine use.   Her Coreg was stopped, as the patient was positive for cocaine, and restarted at discharge.   Coronary artery disease. Continue Coreg for now with aspirin. Otherwise, the patient was asymptomatic. She was fully treated for pneumonia and discharged with levofloxacin.   I spent about 45 minutes with this patient. Education given as far as smoking cessation for over 5 minutes at discharge.    ____________________________ Langley Sink, MD rsg:mr D: 07/28/2013 19:41:00 ET T: 07/28/2013 19:58:32 ET JOB#: [id number]  cc: Tiro Sink, MD, <Dictator> Maxum Cassarino America Brown MD ELECTRONICALLY SIGNED 08/01/2013 11:12

## 2014-09-26 NOTE — Discharge Summary (Signed)
PATIENT NAME:  Michelle [name], Michelle [name] MR#:  [id number] DATE OF BIRTH:  [birth year]  DATE OF ADMISSION:  06/05/2011 DATE OF DISCHARGE:  06/07/2011  PRESENTING COMPLAINT: Cough and shortness of breath.   DISCHARGE DIAGNOSES:  1. Acute hypoxic respiratory failure due to pneumonia and chronic obstructive pulmonary disease flare.  2. Hypertension.  3. Coronary artery disease.  4. Tobacco abuse.   CONDITION ON DISCHARGE: Fair. Saturations 94% on room air.   MEDICATIONS:  1. Coreg 3.125 b.i.d.  2. Enalapril 20 mg b.i.d.  3. Combivent 2 puffs 4 times a day as needed.  4. Keflex 500 mg p.o. t.i.d. for five more days.  5. Zithromax 250 mg p.o. daily.  6. Prednisone taper.  7. Advair 250/50 1 puff b.i.d.   FOLLOW-UP: Follow-up with the [facility] in 1 to 2 weeks.   LABORATORY, DIAGNOSTIC, AND RADIOLOGICAL DATA: CBC within normal limits. Basic metabolic panel within normal limits. pO2 was 63 on room air, this was on admission. EKG shows sinus tachycardia. Blood cultures negative in 48 hours. Influenza A and B are negative. LFTs within normal limits. B-type natriuretic peptide was 90.   CT of the chest done on admission showed no pulmonary embolus. Findings which likely represent multifocal infection including atypical infection. Subpleural nodular density in the right lower lobe is likely related to this process.   BRIEF SUMMARY OF HOSPITAL COURSE: [name] is a 54 year old African American female with history of ongoing tobacco abuse comes in with:   Acute hypoxic respiratory failure due to chronic obstructive pulmonary disease flare and pneumonia. The patient was started on IV antibiotics with Zithromax and Rocephin. She was started on IV steroids as well including inhalers and nebulizers. Her saturations improved and she was 94% on room air prior to discharge.   Bibasilar pneumonia. The patient will complete a course of p.o. antibiotics for about seven days. Her blood cultures remained negative. She  will be on Keflex and Zithromax.   Tobacco abuse. Advised cessation.   Coronary artery disease, status post stent. Continue aspirin, Coreg and Enalapril.  The patient showed clinical improvement over the hospital stay.   CODE STATUS: She remained a FULL CODE.   TIME SPENT: 40 minutes.   ____________________________ Hart Rochester Posey Pronto, MD sap:rbg D: 06/07/2011 13:59:35 ET T: 06/08/2011 15:36:58 ET JOB#: [id number]  cc: Heavin Sebree A. Posey Pronto, MD, <Dictator> Einar Pheasant, MD Ilda Basset MD ELECTRONICALLY SIGNED 06/19/2011 15:24

## 2014-09-26 NOTE — H&P (Signed)
PATIENT NAME:  Michelle, [name] MR#:  [id number] DATE OF BIRTH:  April 04, 1961  DATE OF ADMISSION:  10/04/2011  PRIMARY CARE PHYSICIAN: None.  CHIEF COMPLAINT: Chest tightness and syncopal episode.   HISTORY OF PRESENT ILLNESS: Michelle [name] is a 54 year old African American female with past medical history of coronary artery disease, status post stent x1 in 2009, history of hypertension, and ongoing tobacco abuse and history of asthma, comes to the. 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 She was unscrewing metal objects to be taken to the junkyard this morning, started experiencing chest tightness, took a nitroglycerin sublingual and had a syncopal episode. Next thing she remembers is people standing around her trying to stabilize her. She was brought to the Emergency Room. Her blood pressure was 101/75. Currently during my evaluation blood pressure 137/90. She is being admitted for further evaluation and management. The patient is chest pain free, denies any shortness of breath, although she says she has been having some chronic smoker's cough and has been smoking about 1/2 pack a day.   PAST MEDICAL HISTORY:  1. History of coronary artery disease, status post stent x1 in 2009.  2. Hypertension.  3. Substance abuse. The patient used cocaine about three weeks ago.  4. History of asthma/chronic obstructive pulmonary disease with ongoing tobacco abuse.  5. Medical noncompliance.   MEDICATIONS: (From Discharge Summary of January 2013) 1. Coreg 3.125 b.i.d.  2. Enalapril 20 mg b.i.d.  3. Combivent 2 puffs q.i.d. as needed.  4. Advair 250/50, 1 puff b.i.d.   NOTE: The patient is not taking any medications at present. She tells me it makes her feel dizzy.   SOCIAL HISTORY: She smokes about 1/2 pack a day. She uses cocaine on and off, last use was three weeks ago. She denies much alcohol use. She lives with her  boyfriend.  FAMILY HISTORY: Positive for hypertension.    REVIEW OF SYSTEMS: CONSTITUTIONAL: No fever, fatigue, weakness. EYES: No blurred or double vision. ENT: No tinnitus, ear pain, hearing loss. RESPIRATORY: Positive for cough which appears to be chronic, with chronic obstructive pulmonary disease/asthma. CARDIOVASCULAR: No chest pain at present. No shortness of breath or dyspnea on exertion.  GASTROINTESTINAL: No nausea, vomiting, diarrhea, abdominal pain. No gastroesophageal reflux disease. GENITOURINARY: No dysuria or hematuria. ENDOCRINE: No polyuria, nocturia, or thyroid problems. HEMATOLOGY: No anemia or easy bruising. SKIN: No acne or rash. MUSCULOSKELETAL: Positive for arthritis. NEUROLOGICAL: No cerebrovascular accident or transient ischemic attack. PSYCHIATRIC: No anxiety or depression. All other systems are reviewed and negative.   PHYSICAL EXAMINATION:  GENERAL: The patient is awake, alert, oriented x3, not in acute distress.   VITAL SIGNS: Afebrile, pulse 79, current blood pressure is 137/90, pulse is 82.   HEENT: Atraumatic, normocephalic. Pupils are equal, round, and reactive to light and accommodation. Extraocular movements are intact. Oral mucosa is moist.   NECK: Supple. No JVD. No carotid bruit.   LUNGS: Clear to auscultation bilaterally. No rales, rhonchi, respiratory distress, or labored breathing.   HEART: Both heart sounds are normal. Rate is normal. Rhythm is normal as well. No murmur heard. PMI is not lateralized.   CHEST: Nontender.   EXTREMITIES: Good pedal pulses, good femoral pulses. No lower extremity edema.   ABDOMEN: Soft, benign, and nontender. No organomegaly. Positive bowel sounds.   NEUROLOGIC: Grossly intact cranial nerves II through XII. No motor or sensory deficits.   PSYCHIATRIC: The patient is  awake, alert, and oriented x3.  LABORATORY, DIAGNOSTIC AND RADIOLOGICAL DATA:  EKG shows normal sinus rhythm with biatrial enlargement and flipped T  waves in anterolateral leads.   Myoview stress test in May of 2012 was negative.  First set of cardiac enzymes are negative.  PT-INR within normal limits. CK and CK-MB within normal limits.  Magnesium 1.7.  CBC within normal limits.  Comprehensive metabolic panel within normal limits.  ASSESSMENT: Michelle [name] is a 54 year old with:  1. Chest tightness with syncopal episode after nitroglycerin today after exertional work. Cardiac enzymes first set negative. EKG with no acute ST elevation. The patient does have some flipped T waves in anterolateral leads. She is chest pain free at present. Blood pressure is stable.  2. Coronary artery disease, status post stent x1 in 2009. Myoview scan in 2012 was negative. The patient has been noncompliant to her heart medications, which are Coreg and enalapril.  3. Chronic obstructive pulmonary disease/asthma with ongoing tobacco abuse.  4. Cocaine abuse, last use was three weeks ago.   PLAN:  1. Admit the patient on telemetry floor.  2. We will continue aspirin 81 mg, nitroglycerin p.r.n., start the patient back on Coreg and low-dose enalapril.  3. Smoking cessation advised, about 4 minutes spent for smoking cessation counseling.   4. Cycle cardiac enzymes x3. If the patient rules in, consider Cardiology consultation.  5. Further work-up according to the patient's clinical course.   The hospital admission plan was discussed with the patient and family members.   CODE STATUS:  FULL CODE.     TIME SPENT: 50 minutes.  ____________________________ Hart Rochester Posey Pronto, MD sap:cbb D: 10/04/2011 18:27:58 ET T: 10/04/2011 18:53:33 ET JOB#: [id number] Naythen Heikkila A Dasia Guerrier MD ELECTRONICALLY SIGNED 10/04/2011 19:17

## 2014-09-26 NOTE — H&P (Signed)
PATIENT NAME:  MAKIAH, FOYE MR#:  [id number] DATE OF BIRTH:  1960-12-30  DATE OF ADMISSION:  06/05/2011  PRIMARY CARE PHYSICIAN: Dr. Einar Pheasant  EMERGENCY ROOM PHYSICIAN: Dr. Renard Hamper   CHIEF COMPLAINT: Shortness of breath.   HISTORY OF PRESENT ILLNESS: Patient is a pleasant 54 year old African American female who presents with chief complaint of shortness of breath, wheezing. Symptoms began two days ago. Wheezing and shortness of breath has been getting worse. She had a recent upper respiratory tract illness. She has had dry cough. Symptoms worsening on exertion. She has been lightheaded. She has had nonproductive cough. Patient's grandson who is 13 years old is also sick with upper respiratory tract infection. Chest x-ray shows left lower lobe pneumonia. In the Emergency Room her ABG shows significant hypoxemia, her pO2 was 63. She was placed on BiPAP.   PAST MEDICAL HISTORY:  1. Myocardial infarction.  2. Hypertension.  3. Asthma.  4. Coronary stent placement.   ALLERGIES: No known drug allergies.   CURRENT MEDICATIONS:  1. Albuterol metered dose inhaler 1 to 2 puffs q.4-6 hours p.r.n.  2. Coreg 3.125, 1 p.o. b.i.d.  3. Enalapril 20 mg p.o. b.i.d.   SOCIAL HISTORY: Patient is a smoker. Denies alcohol abuse or drug abuse. She is currently not employed.   FAMILY HISTORY: Patient's mother is in her late 90s. She has hypertension. Father is in late 34s, had a stroke.    REVIEW OF SYSTEMS: CONSTITUTIONAL: Patient denies any fevers, chills, night sweats. HEENT: Patient denies any hearing loss, dysphagia, visual problems, sore throat. CARDIOVASCULAR: Patient denies any chest pain, orthopnea, syncope. RESPIRATORY: Patient reports wheezing, please see history of present illness. GASTROINTESTINAL: Patient denies any abdominal pain, hematemesis, hematochezia or melena. GENITOURINARY: Patient denies any hematuria, dysuria, frequency. NEUROLOGIC: Patient denies any headache, focal weakness or  seizures. SKIN: Patient denies any lesions, rash. ENDOCRINE: Patient denies polyuria, polyphagia, polydipsia. MUSCULOSKELETAL: Patient denies any arthritis, joint effusion, swelling. HEMATOLOGICAL: Patient denies any easy bleeding or bruises.   PHYSICAL EXAMINATION:  VITAL SIGNS: Temperature 99.8, heart rate 103, respiratory rate 22, blood pressure 141/82, oxygen saturation 98%.   HEENT: Atraumatic, normocephalic. Pupils are equal, round, and reactive to light and accommodation. Extraocular movements intact. Sclera is anicteric. Mucous membranes dry.    NECK: Supple. No organomegaly.   CARDIOVASCULAR: S1, S2 tachycardic. No gallops. No thrills. No murmurs.   RESPIRATORY: No rales, rhonchi. Faint expiratory wheezes bilaterally.    GASTROINTESTINAL: Abdomen is soft, nontender, nondistended. Normal bowel sounds. No hepatosplenomegaly.   GENITOURINARY: There is no hematuria or masses noted.   SKIN: No lesions. No rash.   ENDOCRINE: No masses. No thyromegaly noted.   LYMPH: No lymphadenopathy or nodes palpable.   NEUROLOGICAL: Cranial nerves II through XII grossly intact. Motor strength is 5/5 bilateral upper and lower extremities. Sensation is within normal limits. No focal neurological deficit noted on examination.   MUSCULOSKELETAL: No arthritis, joint effusion, swelling.   HEMATOLOGIC: No ecchymosis, no bleeding, no petechiae noted.   EXTREMITIES: No cyanosis, no clubbing, no edema. 2+ pedal pulses noted bilaterally.   LABORATORY, DIAGNOSTIC AND RADIOLOGICAL DATA: ABG: pH 7.36, pCO2 37, pO2 63, bicarbonate 20.9. Glucose 153, BUN 16, creatinine 0.75, sodium 142, potassium 3.3, chloride 103, CO2 26, calcium 8.4, total bilirubin 0.4, alkaline phosphatase 61, ALT 22, AST 24, total protein 8.0, albumin 3.9. Estimated GFR is greater than 60. WBC count 8800, hemoglobin 14.9, hematocrit 44.7, platelet count 156. Magnesium 1.8. Troponin less than 0.02. D-dimer is elevated at 0.48. BNP  90.    ASSESSMENT AND PLAN:  1. Patient is a 54 year old female with history of asthma presents with shortness of breath, wheezing. Patient is in acute respiratory failure, hypoxemic, has metabolic acidosis. Will admit patient to the Critical Care Unit. Continue BiPAP. ABG in the morning. For left lower lobe pneumonia will start Rocephin and Zithromax IV. Patient's d-dimer is elevated. Will check CT with PE protocol.  2. Hypokalemia. Replace potassium. Recheck in the morning. 3. Hypertension. Continue Coreg, enalapril.  4. Coronary artery disease, stent placement. Will start patient on aspirin 81 mg daily.  5. DVT prophylaxis. Lovenox.   ____________________________ Tyrone Schimke, MD jsp:cms D: 06/05/2011 05:09:30 ET T: 06/06/2011 05:40:07 ET JOB#: [id number]  cc: Tyrone Schimke, MD, <Dictator> Einar Pheasant, MD Tyrone Schimke MD ELECTRONICALLY SIGNED 06/06/2011 21:45

## 2014-09-26 NOTE — Discharge Summary (Signed)
PATIENT NAME:  Michelle [name], Michelle [name] MR#:  [id number] DATE OF BIRTH:  1960-11-30  DATE OF ADMISSION:  10/04/2011 DATE OF DISCHARGE:  10/05/2011  For a detailed note, please see the History and Physical done on admission by Dr. Fritzi Mandes.   DISCHARGE DIAGNOSES:  1. Chest pain secondary to cocaine abuse. No evidence of acute coronary syndrome.  2. Hypertension.  3. Tobacco abuse.  4. Chronic obstructive pulmonary disease with no acute exacerbation.   DIET: The patient is being discharged on a low-sodium diet.   ACTIVITY: As tolerated.   FOLLOWUP: Follow up with [facility] in the next 1 to 2 weeks.   DISCHARGE MEDICATIONS:  1. Aspirin 81 mg daily.  2. Sublingual nitroglycerin as needed. 3. Coreg 3.125 mg b.i.d.  4. Enalapril 2.5 mg daily.  5. Combivent 2 puffs q. 6 hours as needed.   PERTINENT STUDIES DURING THE HOSPITAL COURSE:   Chest x-ray done on admission showed no acute cardiopulmonary abnormality.   HOSPITAL COURSE: This is a 54 year old female with medical problems as mentioned above who presented to the hospital secondary to chest pain. The patient's urine toxicology was positive for cocaine and marijuana.  1. Chest pain:  The patient was observed overnight on telemetry, had three sets of cardiac markers checked, which were negative. She had recently done cocaine which is probably the cause of the patient's chest pain, although she did not have any evidence of acute coronary syndrome. She was strongly advised to abstain from doing cocaine. She is being discharged on some aspirin, beta blocker, and ACE inhibitor as she is likely very noncompliant with her medications.  2. Malignant hypertension: The patient had some elevated blood pressures when she presented to the hospital. They have since then improved. She is noncompliant with her medications. She is currently being discharged back on Coreg and enalapril.  3. Chronic obstructive pulmonary disease with no acute exacerbation: The  patient was maintained on her Combivent and p.r.n. nebulizer treatment. She will resume her Combivent on discharge.  4. Tobacco abuse: The patient was placed on a nicotine patch and strongly advised to quit smoking.  5. Polysubstance abuse: The patient's urine toxicology was positive for marijuana and cocaine. The patient was strongly advised to abstain from cocaine abuse.   CODE STATUS:  The patient is a FULL CODE.   TIME SPENT: 35 minutes.  ____________________________ Belia Heman. Verdell Carmine, MD vjs:bjt D: 10/05/2011 15:53:24 ET T: 10/05/2011 16:21:31 ET JOB#: [id number]  cc: Belia Heman. Verdell Carmine, MD, <Dictator> [place] MD ELECTRONICALLY SIGNED 10/12/2011 18:48

## 2014-10-03 NOTE — H&P (Signed)
PATIENT NAME:  Michelle [name], Michelle [name] MR#:  [id number] DATE OF BIRTH:  Jul 29, 1960  DATE OF ADMISSION:  07/10/2014  PRIMARY CARE PHYSICIAN: [facility]    ADMITTING PHYSICIAN: Rodena Goldmann III, MD  CHIEF COMPLAINT: Shortness of breath.  HISTORY OF PRESENT ILLNESS: Michelle [name] is a 54 year old woman seen in the Emergency Room with a several day history of increasing shortness of breath. She has a longstanding history of significant chronic obstructive lung disease. Her history is very difficult to obtain, as she has been medicated. She has a history of polysubstance abuse. She is difficult to arouse, and in moderate respiratory distress. Much of the history is obtained pain from the chart, although she does have some additional remarks to make. She was admitted a year ago with acute respiratory failure to the Emergency Room, placed on BiPAP, and then admitted to the hospital. She has had longstanding chronic lung disease with known ongoing tobacco abuse. She has a history of polysubstance abuse. Her drug screen was positive for cocaine and marijuana at her last admission. She was seen in the Emergency Room 48 hours ago with a significant increase in her shortness of breath. Her workup was unremarkable with no pneumothorax noted and no change in her chest x-ray other than her chronic lung disease. She was given a dose of steroids and her inhalers were changed. She was advised to follow up with her primary care physician. She did not improve over the last 48 hours, came back to the Emergency Room where chest x-ray this evening demonstrated moderate-sized right pneumothorax. After identifying the chest x-ray, the surgical service was consulted.   PAST MEDICAL HISTORY: She history of coronary artery disease, multiple previous MIs, hypertension.   ALLERGIES: She denies any drug allergies.   CURRENT MEDICATIONS: From her previous discharge include: Enalapril 5 mg p.o. daily, Advair Diskus 250/50 twice a day,  albuterol 2.5 mg inhaler every 6 hours p.r.n., alprazolam 0.5 mg p.o. q. 8 hours p.r.n., prednisone 20 mg once a day, and Proventil inhaler 90 mcg 4 times a day p.r.n.   REVIEW OF SYSTEMS: Not possible at the present time because of her current situation.   FAMILY HISTORY: Positive for diabetes and coronary artery disease.    PHYSICAL EXAMINATION:  GENERAL: She is lying quietly in bed with some respiratory distress. She appears to be arousable, but quite somnolent. She has had 1 dose of alprazolam since being in the Emergency Room and no pain medicine.  VITAL SIGNS: Blood pressure is 153/84, heart rate is 92 and regular. She is wearing an oxygen mask and her oxygen saturation is 94%.  HEENT: Reveals no scleral icterus. No pupillary abnormalities. Normal ears with normal canals. LYMPHATIC SYSTEM: Reveals no adenopathy in her axilla, groins, or neck.  RESPIRATORY SYSTEM: Reveals very distant breath sounds with decreased breath sounds in the right side. No adventitious sounds are noted. She does not have an increased respiratory rate. She does have a productive cough.  CIRCULATORY SYSTEM: Reveals no murmurs or gallops with no evidence of any abnormal rhythm. She has a normal PMI. Good femoral pulses.  GASTROINTESTINAL SYSTEM: Reveals no masses, no rebound, no guarding, no tenderness.  MUSCULOSKELETAL SYSTEM: Reveals full range of motion. No deformities.  PSYCHIATRIC: Reveals normal orientation, normal affect with slight depression from her medication.   ASSESSMENT AND PLAN: I have independently reviewed her chest x-ray. She does have a significant pneumothorax on the right side. Because of her symptoms and her history of chronic lung  disease, we elected to place a chest tube. A 20 French chest tube was placed with a different dictation noted. We will admit her to the hospital, ask internal medicine service to assist in her management.   TOTAL TIME SPENT: 60 minutes.     ____________________________ Rodena Goldmann III, MD rle:bm D: 07/10/2014 00:51:04 ET T: 07/10/2014 01:16:40 ET JOB#: [id number]  cc: Rodena Goldmann III, MD, <Dictator> Rodena Goldmann MD ELECTRONICALLY SIGNED 07/10/2014 22:29

## 2014-10-03 NOTE — Op Note (Signed)
PATIENT NAME:  Michelle [name], Michelle [name] MR#:  [id number] DATE OF BIRTH:  17-May-1961  DATE OF PROCEDURE:  07/10/2014  PREOPERATIVE DIAGNOSIS: Spontaneous right pneumothorax.   POSTOPERATIVE DIAGNOSIS: Spontaneous right pneumothorax.   OPERATION: Right tube thoracostomy.   SURGEON: Rodena Goldmann III, MD.   ANESTHESIA: Sedation plus local anesthesia.   OPERATIVE PROCEDURE: With the patient in the right-side-up position, after being appropriately padded and positioned, her right chest was prepped with Betadine and draped with sterile towels. Then, 0.25% Marcaine was injected in the area chosen for the chest tube in her lateral anterior axillary line. The incision was made and bluntly dissected down over the rib space. That area was again instilled with Marcaine. A 20-French chest tube was inserted without difficulty. Air return was immediate. The patient did have a sensation of coughing and some mild discomfort. The tube was secured with 2-0 silk suture, dressed sterilely, and connected to a suction catheter. Chest x-ray is pending.    ____________________________ [name], MD rle:ST D: 07/10/2014 00:52:25 ET T: 07/10/2014 02:37:40 ET JOB#: [id number]  cc: Rodena Goldmann III, MD, <Dictator> Rodena Goldmann MD ELECTRONICALLY SIGNED 07/10/2014 22:30

## 2014-10-03 NOTE — Discharge Summary (Signed)
PATIENT NAME:  Michelle [name], AGENT MR#:  [id number] DATE OF BIRTH:  08/28/1960  DATE OF ADMISSION:  07/10/2014 DATE OF DISCHARGE:  07/13/2014  BRIEF HISTORY: [name] is a 54 year old woman seen in the Emergency Room with signs and symptoms consistent with possible pneumothorax. She had severe COPD and was seen in the Emergency Room 48 hours before with exacerbation of her symptoms. Her symptoms worsened, and repeat chest x-ray on the evening of admission demonstrated a small pneumothorax. Chest tube was placed. She was admitted to the hospital, internal medicine doctors assisted in her management. Dr. Marta Lamas of thoracic surgery service also assist in her management. She continued to improve and was discharged home on the 9th to be followed in the office in 7-10 days' time.   DISCHARGE MEDICATIONS: Include Proventil 90 mcg 2 puffs 4 times a day alprazolam 0.5 mg every 8 hours p.r.n., enalapril 5 mg once a day, Advair 250/50 b.i.d., prednisone 20 mg 2 tablets once a day, nicotine 14 mg transdermal patch, acetaminophen/hydrocodone 5/325, Spiriva 2.5 two puffs once a day, MiraLax 17 grams once a day, and Levaquin 500 mg once a day.   FINAL DISCHARGE DIAGNOSES: Spontaneous pneumothorax, chronic lung disease.   SURGERY: Tube thoracostomy.    ____________________________ Rodena Goldmann III, MD rle:mw D: 07/24/2014 14:50:00 ET T: 07/24/2014 15:30:30 ET JOB#: [id number]  cc: Einar Pheasant, MD Rodena Goldmann III, MD, <Dictator>   Rodena Goldmann MD ELECTRONICALLY SIGNED 07/28/2014 8:17

## 2014-10-03 NOTE — Consult Note (Signed)
Brief Consult Note: Diagnosis: Pneumotohora, COPD.   Patient was seen by consultant.   Consult note dictated.   Orders entered.   Comments: Prime doc will continue to follow for COPD and other medical issues.  Electronic Signatures: [name] (MD)  (Signed 06-Feb-16 00:58)  Authored: Brief Consult Note   Last Updated: 06-Feb-16 00:58 by [name] (MD)

## 2014-10-03 NOTE — Consult Note (Signed)
PATIENT NAME:  Michelle [name], Michelle [name] MR#:  [id number] DATE OF BIRTH:  1961/04/10  DATE OF CONSULTATION:  07/10/2014  REFERRING PHYSICIAN:  [name], MD   CONSULTING PHYSICIAN:  Ceasar Lund. Anselm Jungling, MD  REASON FOR CONSULT:  COPD management.   HISTORY OF PRESENTING ILLNESS: A 54 year old female who has history of COPD, hypertension, coronary artery disease, hyperlipidemia, and cocaine and smoking abuse who was in the Emergency Room 2 days ago for COPD exacerbation, was sent home with prednisone and azithromycin. She continued to feel short of breath and was getting worse so came to the Emergency Room again today and noted to have pneumothorax spontaneous so surgical consult was called in by ER and Dr. Pat Patrick put a chest tube and admitted to surgical services and called medical consult to manage her COPD.   On my questioning, the patient denies any chest pain, cough, or palpitation but complains of shortness of breath and wheezing. She denies any fever.   REVIEW OF SYSTEMS:  CONSTITUTIONAL: Negative for fever, fatigue, weakness, pain or weight loss.  EYES: No blurring, double vision, discharge or redness.  EARS, NOSE, THROAT: No tinnitus, ear pain, or hearing loss.  RESPIRATORY: Denies any cough, wheezing, but has some shortness of breath.  CARDIOVASCULAR: No chest pain, orthopnea, edema, arrhythmia, palpitation.  GASTROINTESTINAL: No nausea, vomiting, diarrhea, abdominal pain.  GENITOURINARY: No dysuria, hematuria, increased frequency.  ENDOCRINE: No heat or cold intolerance. No excessive sweating.  SKIN: No acne, rashes, or lesions.  MUSCULOSKELETAL: No pain or swelling in the joints.  NEUROLOGICAL: No numbness, weakness, tremor or vertigo.  PSYCHIATRIC: No anxiety, insomnia, bipolar disorder.   PAST MEDICAL HISTORY:  1.  COPD.  2.  Hypertension.  3.  Coronary artery disease.  4.  Hyperlipidemia.  5.  Hypertension.  6.  Cocaine abuse.  7.  Tobacco abuse.   PAST SURGICAL HISTORY:   1.  Status post stent after cardiac catheterization.  2.  Chest tube placement for spontaneous pneumothorax in February 2006 admission.   SOCIAL HISTORY: Active smoker 4 to 5 cigarettes a day. Has a history of cocaine abuse. Denies alcohol.   FAMILY HISTORY: Positive for diabetes and stroke in her parents and coronary artery disease in parents as well.     HOME MEDICATIONS:  1.  Proventil 2 puffs inhalation 4 times a day.  2.  Prednisone 20 mg oral 2 tablets once a day.  3.  Levalbuterol 3 mL inhalation 3 times a day.  4.  Enalapril 5 mg oral once a day.  5.  Azithromycin 250 mg oral 2 tablets by mouth once a day.  6.  Alprazolam 0.5 mg oral tablet every 8 hours as needed for anxiety.  7.  Albuterol 3 mL inhalation every 6 hours as needed.  8.  Advair Diskus 1 puff inhalation 2 times a day.   PHYSICAL EXAMINATION:  VITALS: In the ER, temperature 98, pulse is 104, respiration is 29, blood pressure is 152/90, and pulse oximetry is 100% on 3 liters oxygen supplementation.  GENERAL: The patient is fully alert and oriented to time, place, and person. Does not appear in any acute distress.  HEENT: Head and neck atraumatic. Conjunctivae pink. Oral mucosa moist.  NECK: Supple. No JVD. Thyroid nontender.  RESPIRATORY: Bilateral air entry present. There is some wheezing currently. No crepitation. There is a chest tube present on the right side of the chest.  CARDIOVASCULAR: S1, S2 present, regular. No murmur.  ABDOMEN: Soft, nontender. Bowel sounds present. No  organomegaly.  SKIN: No rashes. No ulcers. Legs: No edema. Joints: No swelling or tenderness.  NEUROLOGICAL: Power 5/5, follows commands, moves all 4 limbs. Sensation intact. No tremor or rigidity.  PSYCHIATRIC: Does not appear in any acute psychiatric illness at this point.   IMPORTANT LABORATORY RESULTS: Chest x-ray shows approximately 30% volume on the right side pneumothorax.   BNP is 380, glucose 116,  BUN 13, creatinine 0.92,  sodium 141, potassium 3.9, chloride 107, CO2 is 26, and calcium is 9.1.   Total protein is 8.4, bilirubin 0.3, alkaline phosphate 89, SGOT 22, and SGPT is 20.   Troponin less than 0.02.   WBC 12.9, hemoglobin is 14.3, platelet count is 273, MCV is 88.   On venous blood pH is 7.29 and pCO2 is 54.   ASSESSMENT AND PLAN: A 54 year old female who has past history of chronic obstructive pulmonary disease, hypertension, hyperlipidemia. She came with shortness of breath and noted to have spontaneous pneumothorax. Admitted to surgical service after chest tube placement for pneumothorax and medical consult for COPD and medical management.  1.  Spontaneous pneumothorax, management per primary team. The patient is already having chest tube.    2.  Chronic obstructive pulmonary disease exacerbation. We will give IV Solu-Medrol and nebulizer treatment in the hospital and we can switch to Advair and her baseline inhalers and nebulizer on discharge.  3.  Hypertension. Continue enalapril.  4.  Anxiety. Continue alprazolam as needed.  5.  Smoking. Smoking cessation counseling is done for 4 minutes and the patient agreed to have nicotine patch.   total time spent in this consult is 45 minutes. We will continue following for medical issue while she is in the hospital.    ____________________________ Ceasar Lund. Anselm Jungling, MD vgv:AT D: 07/10/2014 01:08:00 ET T: 07/10/2014 02:07:30 ET JOB#: [id number]  cc: Ceasar Lund. Anselm Jungling, MD, <Dictator> Einar Pheasant, MD [name] MD ELECTRONICALLY SIGNED 07/21/2014 15:56

## 2014-10-11 IMAGING — CR DG CHEST 2V
1 series · 2 of 2 positions shown · non-contrast
Comparison: 10/04/2011

CLINICAL DATA: Severe shortness breath.  Asthma.

EXAM:
CHEST  2 VIEW

[Series 1: pa · 0.17mm/px · 2 of 2 slices shown]
[im 1/2]
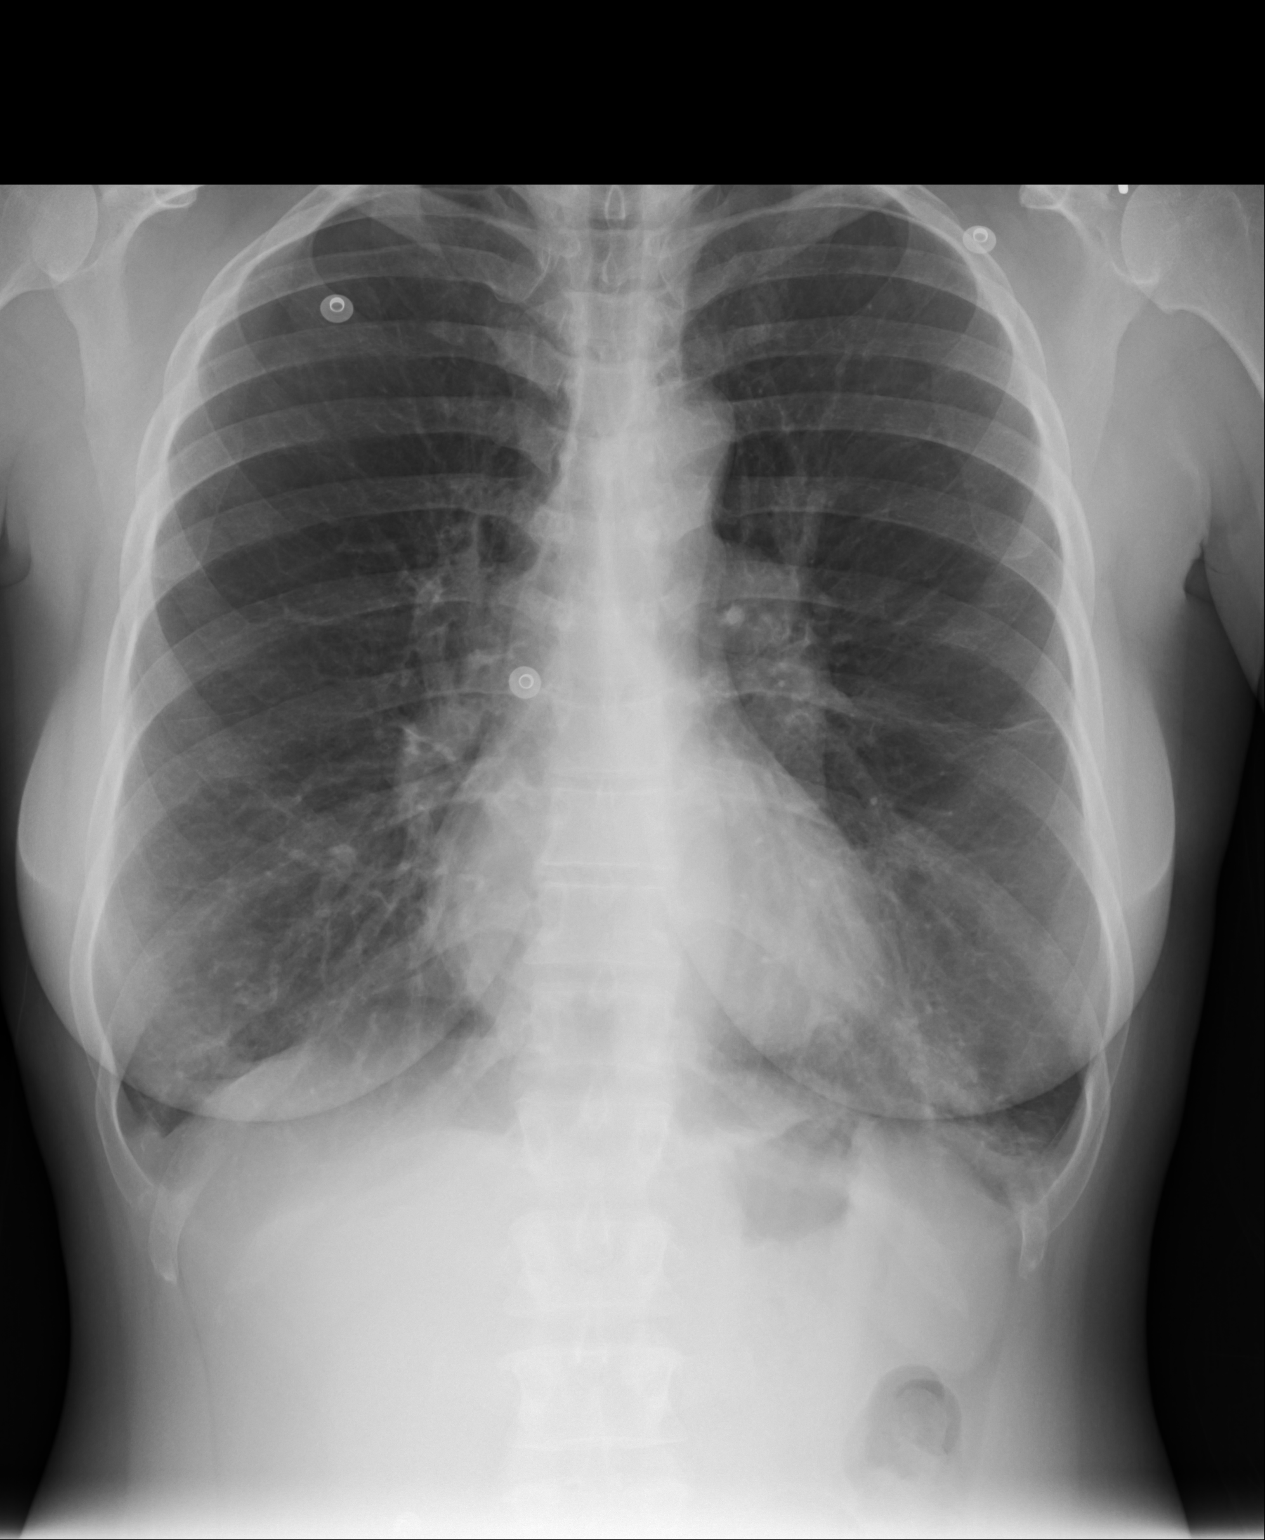
[im 2/2]
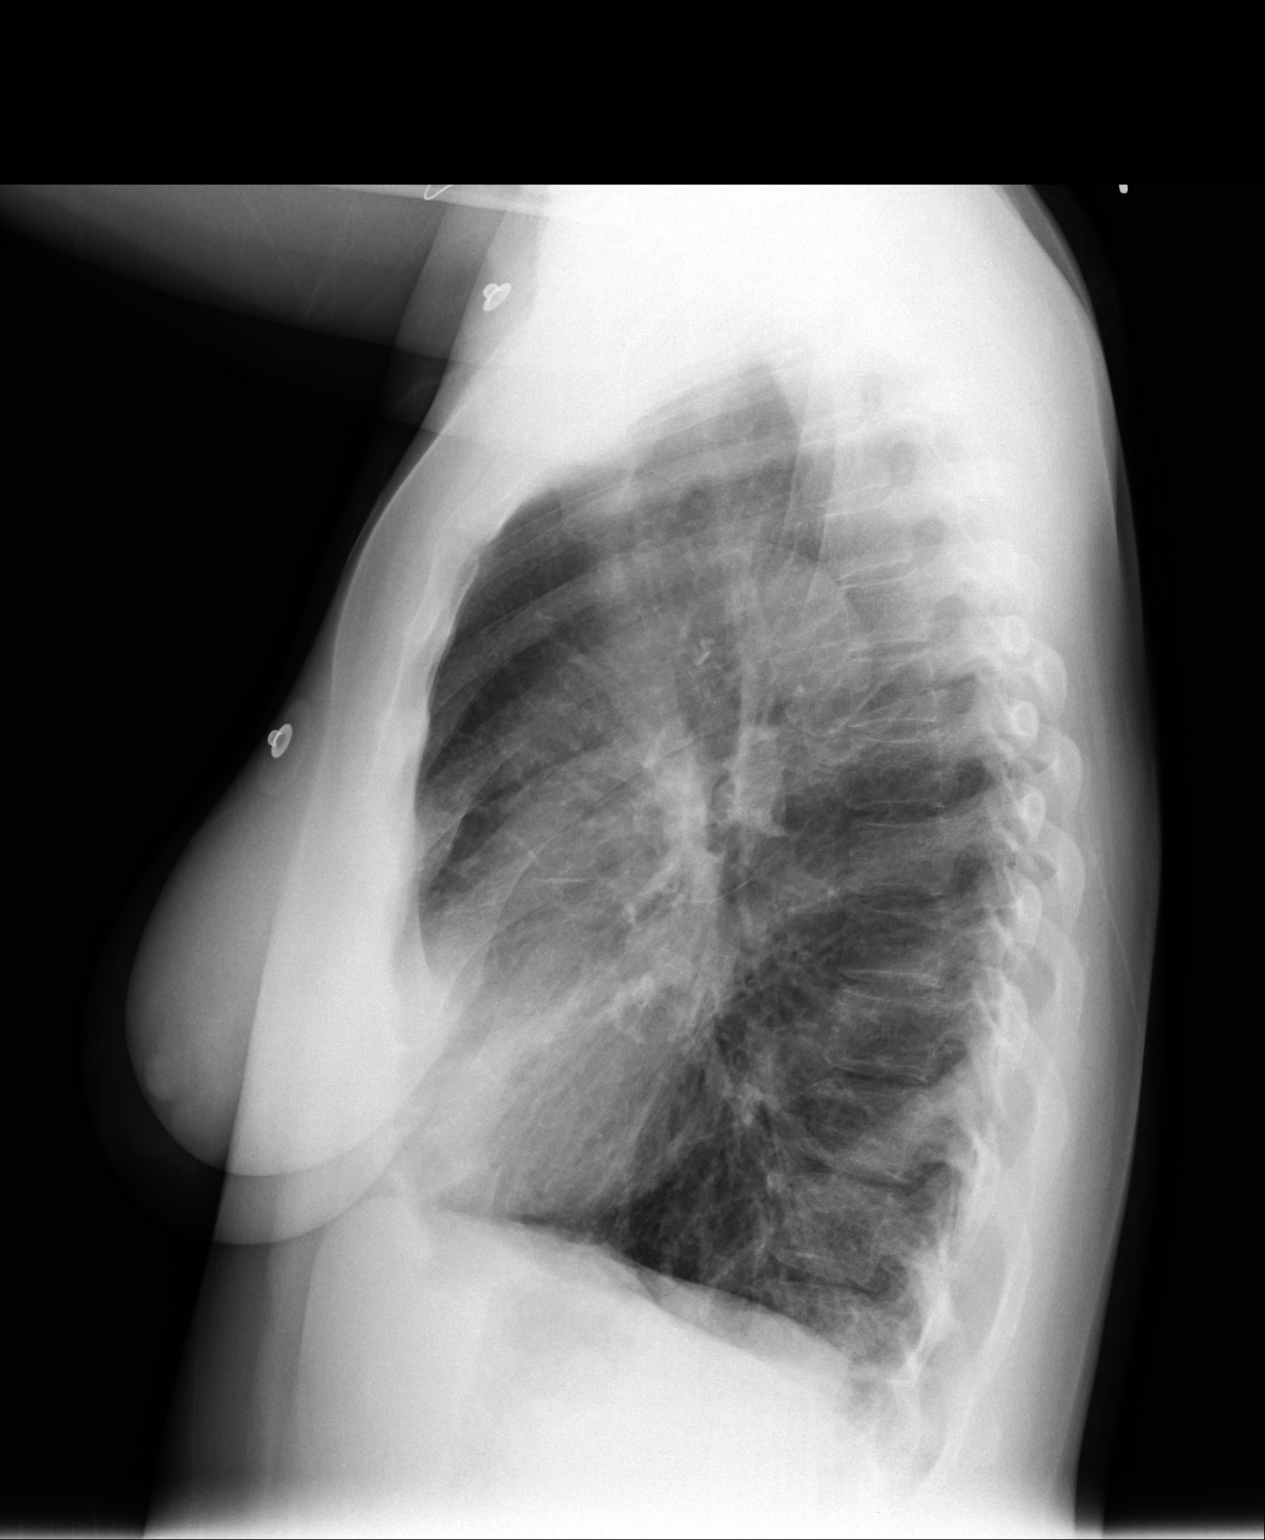

[2 of 2 positions shown; findings below may reference images not displayed]

FINDINGS: Changes of COPD are again demonstrated. Increased opacity is seen in
the left lung base, likely within the lingula, suspicious for
pneumonia. Right lung remains clear. No evidence of pleural
effusion. Heart size is normal. No definite mass or lymphadenopathy
identified.
IMPRESSION: New opacity at left lung base, suspicious for pneumonia. Recommend
short-term radiographic followup in several weeks to confirm
resolution.

COPD.

## 2014-10-12 IMAGING — CR DG CHEST 2V
1 series · 2 of 2 positions shown · non-contrast
Comparison: PA and lateral chest x-ray dated June 28, 2013

CLINICAL DATA: Pneumonia and hypokalemia

EXAM:
CHEST  2 VIEW

[Series 2: w chest pa · 0.14mm/px · 2 of 2 slices shown]
[im 1/2]
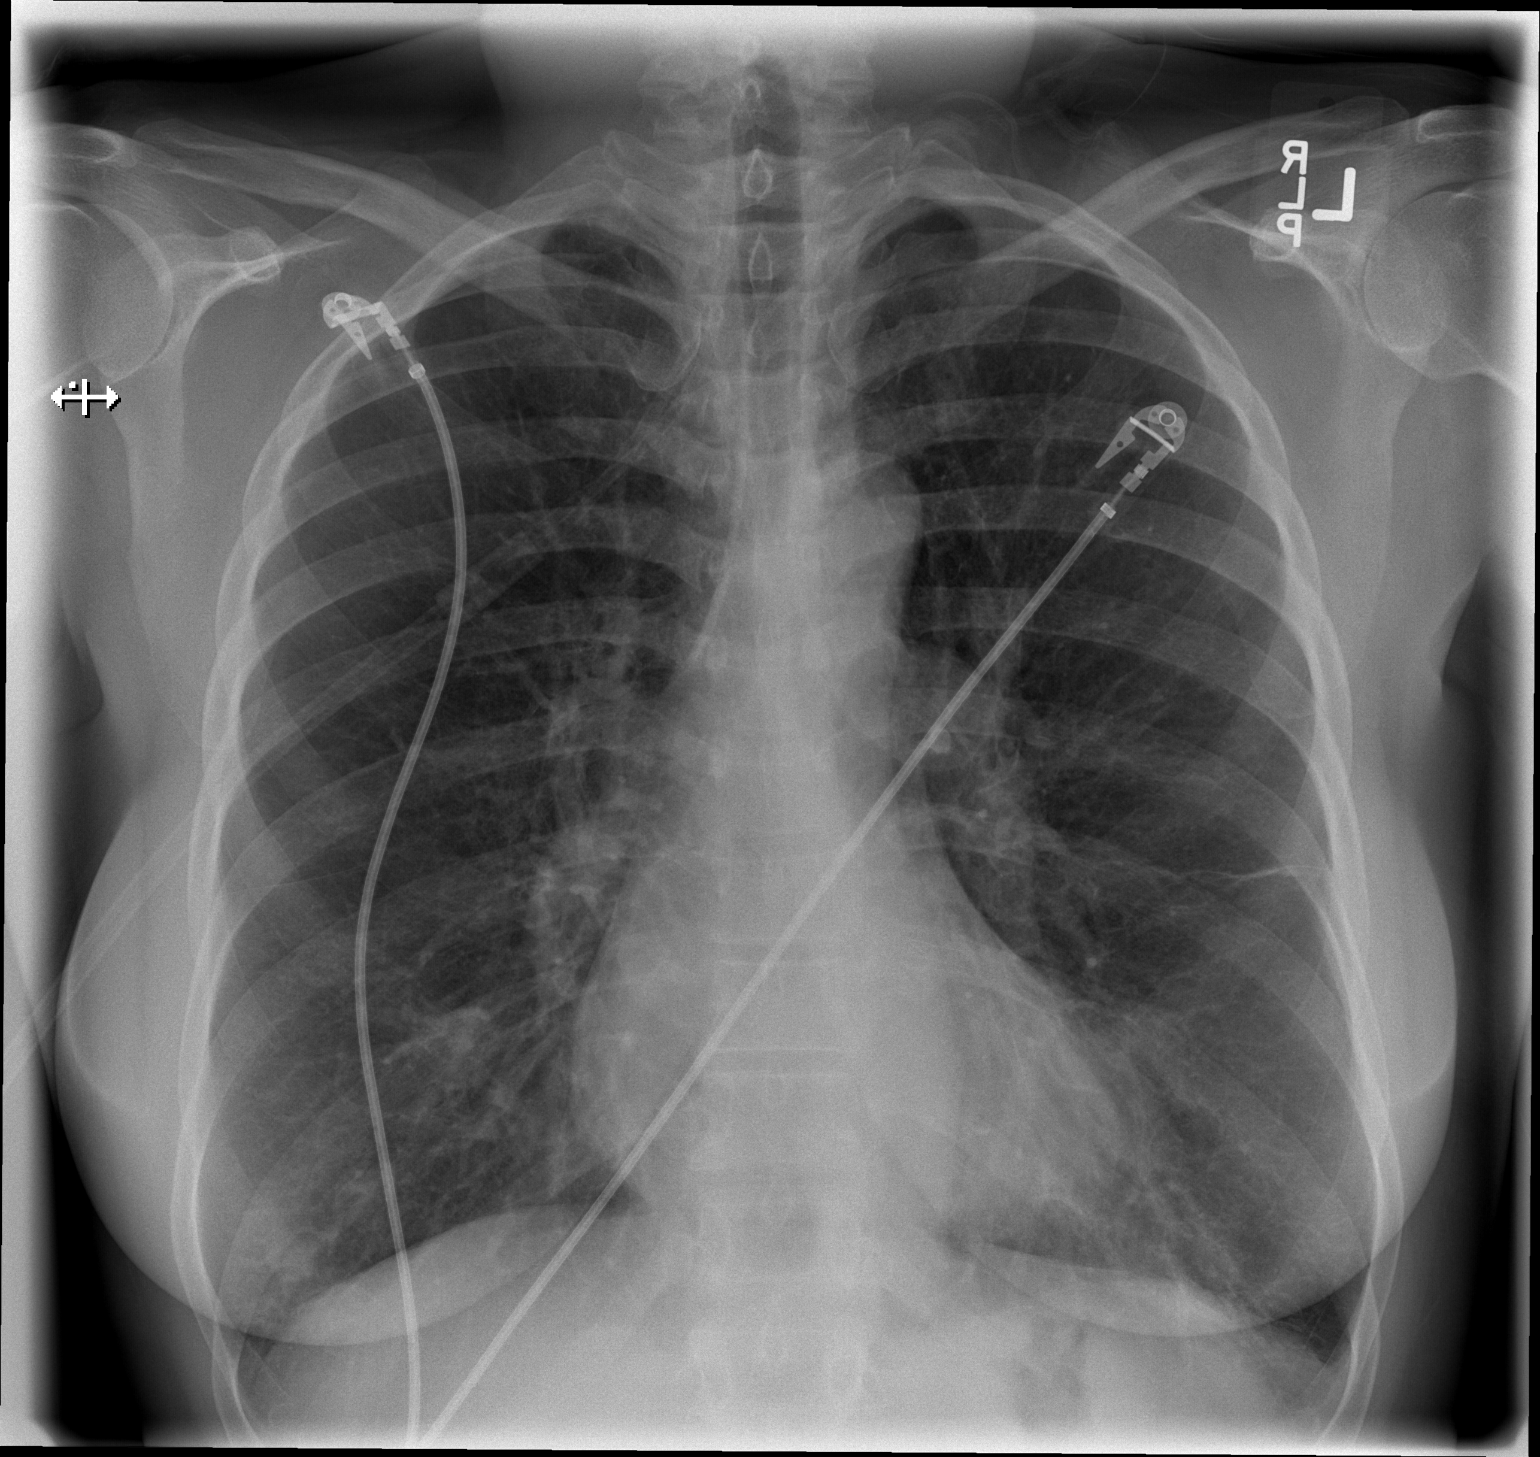
[im 2/2]
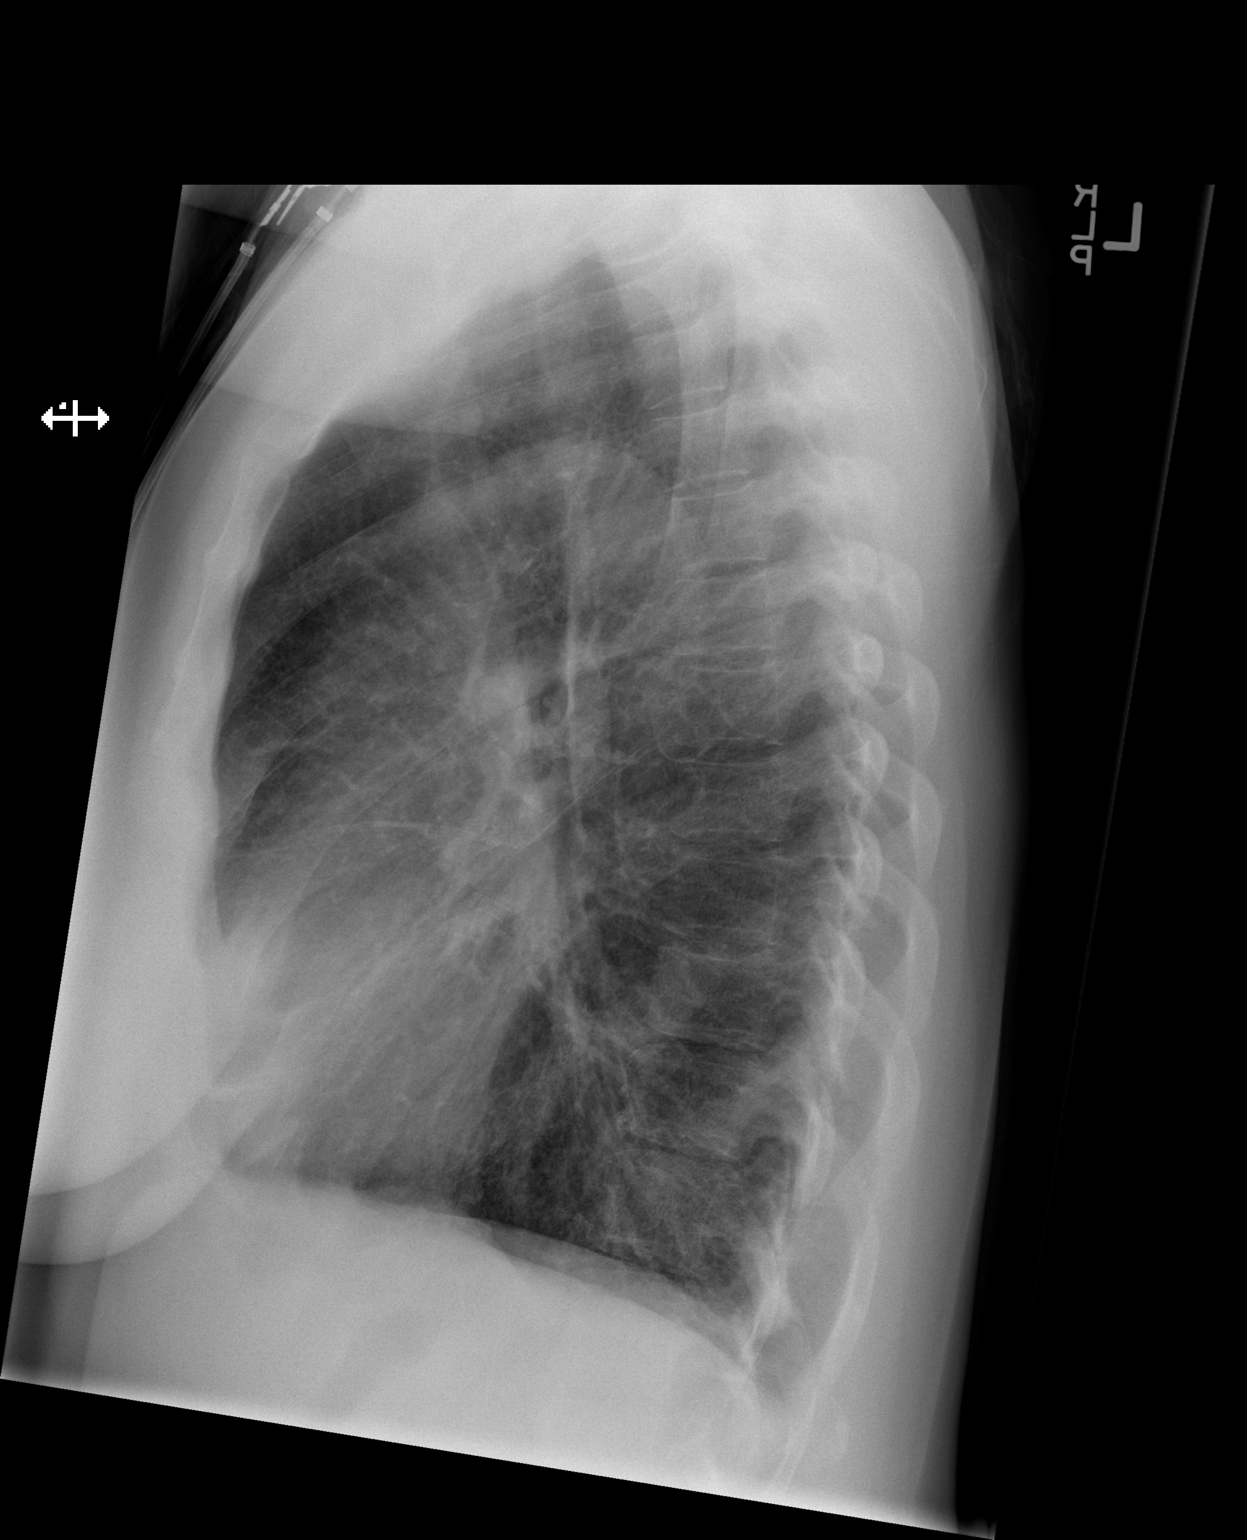

[2 of 2 positions shown; findings below may reference images not displayed]

FINDINGS: The lungs are hyperinflated with hemidiaphragm flattening. There is
improved appearance of the lingula with decreased density
demonstrated. There is linear subsegmental atelectasis versus
scarring laterally in the left mid hemithorax. The cardiac
silhouette is normal in size. The pulmonary vascularity is not
engorged. The mediastinum is normal in width. There is mild
tortuosity of the descending thoracic aorta. There is no pleural
effusion or pneumothorax. The observed portions of the bony thorax
exhibit no acute abnormality.
IMPRESSION: There is hyperinflation consistent with COPD. Density at the left
lung base has improved considerably consistent with resolving
lingular atelectasis or pneumonia.

## 2014-10-24 ENCOUNTER — Inpatient Hospital Stay
Admission: EM | Admit: 2014-10-24 | Discharge: 2014-10-27 | DRG: 871 | Disposition: A | Discharge status: Home / Self Care | Payer: Self-pay | Attending: Internal Medicine | Admitting: Internal Medicine

## 2014-10-24 ENCOUNTER — Emergency Department: Payer: Self-pay

## 2014-10-24 ENCOUNTER — Encounter: Payer: Self-pay | Admitting: Emergency Medicine

## 2014-10-24 DIAGNOSIS — J441 Chronic obstructive pulmonary disease with (acute) exacerbation: Secondary | ICD-10-CM | POA: Diagnosis present

## 2014-10-24 DIAGNOSIS — R0602 Shortness of breath: Secondary | ICD-10-CM

## 2014-10-24 DIAGNOSIS — E440 Moderate protein-calorie malnutrition: Secondary | ICD-10-CM | POA: Insufficient documentation

## 2014-10-24 DIAGNOSIS — J9601 Acute respiratory failure with hypoxia: Secondary | ICD-10-CM | POA: Diagnosis present

## 2014-10-24 DIAGNOSIS — F1290 Cannabis use, unspecified, uncomplicated: Secondary | ICD-10-CM | POA: Diagnosis present

## 2014-10-24 DIAGNOSIS — Z720 Tobacco use: Secondary | ICD-10-CM | POA: Diagnosis present

## 2014-10-24 DIAGNOSIS — I10 Essential (primary) hypertension: Secondary | ICD-10-CM | POA: Diagnosis present

## 2014-10-24 DIAGNOSIS — J449 Chronic obstructive pulmonary disease, unspecified: Secondary | ICD-10-CM | POA: Diagnosis present

## 2014-10-24 DIAGNOSIS — I4510 Unspecified right bundle-branch block: Secondary | ICD-10-CM | POA: Diagnosis present

## 2014-10-24 DIAGNOSIS — A419 Sepsis, unspecified organism: Principal | ICD-10-CM | POA: Diagnosis present

## 2014-10-24 DIAGNOSIS — R509 Fever, unspecified: Secondary | ICD-10-CM | POA: Diagnosis present

## 2014-10-24 DIAGNOSIS — J189 Pneumonia, unspecified organism: Secondary | ICD-10-CM | POA: Diagnosis present

## 2014-10-24 DIAGNOSIS — F17210 Nicotine dependence, cigarettes, uncomplicated: Secondary | ICD-10-CM | POA: Diagnosis present

## 2014-10-24 DIAGNOSIS — Z8249 Family history of ischemic heart disease and other diseases of the circulatory system: Secondary | ICD-10-CM

## 2014-10-24 DIAGNOSIS — I2510 Atherosclerotic heart disease of native coronary artery without angina pectoris: Secondary | ICD-10-CM | POA: Diagnosis present

## 2014-10-24 DIAGNOSIS — Z681 Body mass index (BMI) 19 or less, adult: Secondary | ICD-10-CM

## 2014-10-24 DIAGNOSIS — F1410 Cocaine abuse, uncomplicated: Secondary | ICD-10-CM | POA: Diagnosis present

## 2014-10-24 HISTORY — DX: Emphysema, unspecified: J43.9

## 2014-10-24 HISTORY — DX: Essential (primary) hypertension: I10

## 2014-10-24 HISTORY — DX: Chronic obstructive pulmonary disease, unspecified: J44.9

## 2014-10-24 HISTORY — DX: Atherosclerotic heart disease of native coronary artery without angina pectoris: I25.10

## 2014-10-24 HISTORY — DX: Pneumothorax, unspecified: J93.9

## 2014-10-24 HISTORY — DX: Pure hypercholesterolemia, unspecified: E78.00

## 2014-10-24 HISTORY — DX: Tobacco use: Z72.0

## 2014-10-24 HISTORY — DX: Cocaine abuse, uncomplicated: F14.10

## 2014-10-24 LAB — COMPREHENSIVE METABOLIC PANEL
ALBUMIN: 3.9 g/dL (ref 3.5–5.0)
ALK PHOS: 57 U/L (ref 38–126)
ALT: 11 U/L — ABNORMAL LOW (ref 14–54)
AST: 18 U/L (ref 15–41)
Anion gap: 9 (ref 5–15)
BILIRUBIN TOTAL: 0.4 mg/dL (ref 0.3–1.2)
BUN: 12 mg/dL (ref 6–20)
CALCIUM: 8.8 mg/dL — AB (ref 8.9–10.3)
CO2: 28 mmol/L (ref 22–32)
CREATININE: 0.82 mg/dL (ref 0.44–1.00)
Chloride: 100 mmol/L — ABNORMAL LOW (ref 101–111)
GFR calc Af Amer: >60 mL/min (ref >60)
Glucose, Bld: 154 mg/dL — ABNORMAL HIGH (ref 65–99)
POTASSIUM: 3.5 mmol/L (ref 3.5–5.1)
Sodium: 137 mmol/L (ref 135–145)
Total Protein: 7.5 g/dL (ref 6.5–8.1)

## 2014-10-24 LAB — BLOOD GAS, ARTERIAL
ALLENS TEST (PASS/FAIL): POSITIVE — AB
Acid-base deficit: 1.2 mmol/L (ref 0.0–2.0)
Bicarbonate: 23.6 mEq/L (ref 21.0–28.0)
DELIVERY SYSTEMS: POSITIVE
Expiratory PAP: 6
FIO2: 40 %
INSPIRATORY PAP: 12
O2 Saturation: 94.5 %
PCO2 ART: 39 mmHg (ref 32.0–48.0)
PH ART: 7.39 (ref 7.350–7.450)
Patient temperature: 37
pO2, Arterial: 74 mmHg — ABNORMAL LOW (ref 83.0–108.0)

## 2014-10-24 LAB — CBC
HCT: 44.1 % (ref 35.0–47.0)
HEMOGLOBIN: 14.1 g/dL (ref 12.0–16.0)
MCH: 27.8 pg (ref 26.0–34.0)
MCHC: 32 g/dL (ref 32.0–36.0)
MCV: 87 fL (ref 80.0–100.0)
Platelets: 188 10*3/uL (ref 150–440)
RBC: 5.07 MIL/uL (ref 3.80–5.20)
RDW: 14.3 % (ref 11.5–14.5)
WBC: 18 10*3/uL — AB (ref 3.6–11.0)

## 2014-10-24 LAB — BRAIN NATRIURETIC PEPTIDE: B NATRIURETIC PEPTIDE 5: 111 pg/mL — AB (ref 0.0–100.0)

## 2014-10-24 LAB — TROPONIN I: TROPONIN I: 0.04 ng/mL — AB (ref <0.031)

## 2014-10-24 LAB — GLUCOSE, CAPILLARY: GLUCOSE-CAPILLARY: 135 mg/dL — AB (ref 65–99)

## 2014-10-24 MED ORDER — METHYLPREDNISOLONE SODIUM SUCC 125 MG IJ SOLR
60.0000 mg | INTRAMUSCULAR | Status: DC
  Administered 2014-10-24 – 2014-10-25 (×2): 60 mg via INTRAVENOUS
  Filled 2014-10-24 (×2): qty 2

## 2014-10-24 MED ORDER — LEVOFLOXACIN IN D5W 500 MG/100ML IV SOLN
500.0000 mg | INTRAVENOUS | Status: DC
  Administered 2014-10-24 – 2014-10-26 (×3): 500 mg via INTRAVENOUS
  Filled 2014-10-24 (×4): qty 100

## 2014-10-24 MED ORDER — DEXTROSE 5 % IV SOLN
500.0000 mg | Freq: Once | INTRAVENOUS | Status: AC
  Administered 2014-10-24: 500 mg via INTRAVENOUS

## 2014-10-24 MED ORDER — ENALAPRIL MALEATE 5 MG PO TABS
5.0000 mg | ORAL_TABLET | Freq: Every day | ORAL | Status: DC
  Administered 2014-10-24 – 2014-10-27 (×4): 5 mg via ORAL
  Filled 2014-10-24 (×4): qty 1

## 2014-10-24 MED ORDER — TIOTROPIUM BROMIDE MONOHYDRATE 18 MCG IN CAPS
18.0000 ug | ORAL_CAPSULE | Freq: Every day | RESPIRATORY_TRACT | Status: DC
  Administered 2014-10-24 – 2014-10-27 (×4): 18 ug via RESPIRATORY_TRACT
  Filled 2014-10-24 (×2): qty 5

## 2014-10-24 MED ORDER — ONDANSETRON HCL 4 MG/2ML IJ SOLN: 4.0000 mg | Freq: Four times a day (QID) | INTRAMUSCULAR | Status: DC | PRN

## 2014-10-24 MED ORDER — BISACODYL 5 MG PO TBEC: 5.0000 mg | DELAYED_RELEASE_TABLET | Freq: Every day | ORAL | Status: DC | PRN

## 2014-10-24 MED ORDER — SODIUM CHLORIDE 0.9 % IJ SOLN: 3.0000 mL | INTRAMUSCULAR | Status: DC | PRN

## 2014-10-24 MED ORDER — ALUM & MAG HYDROXIDE-SIMETH 200-200-20 MG/5ML PO SUSP: 30.0000 mL | Freq: Four times a day (QID) | ORAL | Status: DC | PRN

## 2014-10-24 MED ORDER — DEXTROSE 5 % IV SOLN
1.0000 g | Freq: Once | INTRAVENOUS | Status: AC
  Administered 2014-10-24: 1 g via INTRAVENOUS

## 2014-10-24 MED ORDER — ASPIRIN EC 81 MG PO TBEC
81.0000 mg | DELAYED_RELEASE_TABLET | Freq: Every day | ORAL | Status: DC
  Administered 2014-10-24 – 2014-10-27 (×4): 81 mg via ORAL
  Filled 2014-10-24 (×4): qty 1

## 2014-10-24 MED ORDER — ENOXAPARIN SODIUM 40 MG/0.4ML ~~LOC~~ SOLN
40.0000 mg | SUBCUTANEOUS | Status: DC
  Administered 2014-10-24 – 2014-10-26 (×3): 40 mg via SUBCUTANEOUS
  Filled 2014-10-24 (×3): qty 0.4

## 2014-10-24 MED ORDER — DEXTROSE 5 % IV SOLN
INTRAVENOUS | Status: AC
  Filled 2014-10-24: qty 500

## 2014-10-24 MED ORDER — ONDANSETRON HCL 4 MG PO TABS: 4.0000 mg | ORAL_TABLET | Freq: Four times a day (QID) | ORAL | Status: DC | PRN

## 2014-10-24 MED ORDER — ACETAMINOPHEN 650 MG RE SUPP: 650.0000 mg | Freq: Four times a day (QID) | RECTAL | Status: DC | PRN

## 2014-10-24 MED ORDER — ZOLPIDEM TARTRATE 5 MG PO TABS
5.0000 mg | ORAL_TABLET | Freq: Every evening | ORAL | Status: DC | PRN
  Administered 2014-10-24 – 2014-10-26 (×3): 5 mg via ORAL
  Filled 2014-10-24 (×3): qty 1

## 2014-10-24 MED ORDER — MOMETASONE FURO-FORMOTEROL FUM 100-5 MCG/ACT IN AERO
2.0000 | INHALATION_SPRAY | Freq: Two times a day (BID) | RESPIRATORY_TRACT | Status: DC
  Administered 2014-10-24 – 2014-10-27 (×6): 2 via RESPIRATORY_TRACT
  Filled 2014-10-24 (×2): qty 8.8

## 2014-10-24 MED ORDER — ALBUTEROL SULFATE (2.5 MG/3ML) 0.083% IN NEBU
2.5000 mg | INHALATION_SOLUTION | Freq: Once | RESPIRATORY_TRACT | Status: AC
  Administered 2014-10-24: 2.5 mg via RESPIRATORY_TRACT

## 2014-10-24 MED ORDER — SODIUM CHLORIDE 0.9 % IV SOLN: 250.0000 mL | INTRAVENOUS | Status: DC | PRN

## 2014-10-24 MED ORDER — ALBUTEROL SULFATE (2.5 MG/3ML) 0.083% IN NEBU
2.5000 mg | INHALATION_SOLUTION | RESPIRATORY_TRACT | Status: DC | PRN
  Administered 2014-10-25 (×2): 2.5 mg via RESPIRATORY_TRACT
  Filled 2014-10-24 (×2): qty 3

## 2014-10-24 MED ORDER — SODIUM CHLORIDE 0.9 % IJ SOLN
3.0000 mL | Freq: Two times a day (BID) | INTRAMUSCULAR | Status: DC
  Administered 2014-10-24 – 2014-10-27 (×7): 3 mL via INTRAVENOUS

## 2014-10-24 MED ORDER — MAGNESIUM CITRATE PO SOLN
1.0000 | Freq: Once | ORAL | Status: AC | PRN
  Filled 2014-10-24: qty 296

## 2014-10-24 MED ORDER — ACETAMINOPHEN 325 MG PO TABS: 650.0000 mg | ORAL_TABLET | Freq: Four times a day (QID) | ORAL | Status: DC | PRN

## 2014-10-24 MED ORDER — DEXTROSE 5 % IV SOLN
INTRAVENOUS | Status: AC
  Filled 2014-10-24: qty 10

## 2014-10-24 MED ORDER — ALBUTEROL SULFATE (2.5 MG/3ML) 0.083% IN NEBU
INHALATION_SOLUTION | RESPIRATORY_TRACT | Status: AC
  Administered 2014-10-24: 2.5 mg
  Filled 2014-10-24: qty 3

## 2014-10-24 NOTE — ED Notes (Signed)
John RT at bedside to assist with transfer.

## 2014-10-24 NOTE — ED Notes (Signed)
MD at bedside upon arrival, pt placed on bipap by RT.

## 2014-10-24 NOTE — Progress Notes (Signed)
Tolerating being off of bipap this evening. Ate dinner and tolerated well. Denies pain. Afebrile. VSS. NSR. o2 sats 96% on RA awake and 90-91% while asleep. Dyspnic with exertion. Used bedpan and tolerated. Call Bell in reach with no distress noted.  Michelle [name] B

## 2014-10-24 NOTE — Progress Notes (Signed)
   10/24/14 2000  Clinical Encounter Type  Visited With Patient  Visit Type Initial;Spiritual support  Referral From Nurse  Consult/Referral To Chaplain  Spiritual Encounters  Spiritual Needs Prayer  Stress Factors  Patient Stress Factors Health changes  Chaplain received order that patient requested prayer. Visited with patient and offered prayer and compassionate presence. Chaplain Ayslin Kundert A. Karisa Nesser Ext. [phone]

## 2014-10-24 NOTE — ED Provider Notes (Signed)
[facility] Emergency Department Provider Note  ____________________________________________  Time seen: On arrival 10:20 AM  I have reviewed the triage vital signs and the nursing notes.   HISTORY  Chief Complaint Respiratory Distress   History limited by patient distress   HPI Michelle [name] is a 54 y.o. female who presents from home and respiratory distress. Patient reportedly has a history of COPDand told EMS that she had a fever and cough last night that her breathing worsened overnight. EMS gave slight Medrol IV,epi IM, 2 g of mag IV and placed the patient on CPAP. Patient in tripod position in severe distress.     Past Medical History  Diagnosis Date  . COPD (chronic obstructive pulmonary disease)   . Emphysema/COPD   . Hypertension   . High cholesterol     There are no active problems to display for this patient.   History reviewed. No pertinent past surgical history.  No current outpatient prescriptions on file.  Allergies Review of patient's allergies indicates no known allergies.  No family history on file.  Social History History  Substance Use Topics  . Smoking status: Current Every Day Smoker  . Smokeless tobacco: Not on file  . Alcohol Use: No    Review of Systems  Constitutional: Positive for fever  Cardiovascular: Negative for chest pain. Respiratory: Positive for shortness of breath. Positive for cough  Level 5 caveat: Patient distress limits complete review of symptoms ____________________________________________   PHYSICAL EXAM:  VITAL SIGNS: BP 120/84 mmHg  Pulse 94  Resp 25  Wt 132 lb 4.4 oz (60 kg)  SpO2 99% On BiPAP , 85% on arrival                                                                Constitutional: In severe respiratory distress Eyes: Conjunctivae are normal. ENT   Head: Normocephalic and atraumatic.   Nose: No congestion/rhinnorhea.   Mouth/Throat: Mucous  membranes are moist.   Neck: No stridor.  Cardiovascular: Tachycardia, regular rhythm. Normal and symmetric distal pulses are present in all extremities. No murmurs, rubs, or gallops. Respiratory: Tripod position, accessory muscle use, poor airflow, scattered wheezes Gastrointestinal: Soft and nontender. No distention. There is no CVA tenderness. Genitourinary: deferred Musculoskeletal: Nontender with normal range of motion in all extremities. No joint effusions.  No lower extremity tenderness nor edema. Neurologic:  Normal speech and language. No gross focal neurologic deficits are appreciated. Speech is normal.  Skin:  Skin is warm, dry and intact. No rash noted. Psychiatric: Mood and affect are appropriately anxious for situation  ____________________________________________    LABS (pertinent positives/negatives)  Labs Reviewed  CBC - Abnormal; Notable for the following:    WBC 18.0 (*)    All other components within normal limits  BRAIN NATRIURETIC PEPTIDE - Abnormal; Notable for the following:    B Natriuretic Peptide 111.0 (*)    All other components within normal limits  BLOOD GAS, ARTERIAL - Abnormal; Notable for the following:    pO2, Arterial 74 (*)    Allens test (pass/fail) POSITIVE (*)    All other components within normal limits  CULTURE, BLOOD (ROUTINE X 2)  CULTURE, BLOOD (ROUTINE X 2)  COMPREHENSIVE METABOLIC PANEL  TROPONIN I     ____________________________________________  EKG  ED ECG REPORT I, Lavonia Drafts, the attending physician, personally viewed and interpreted this ECG.   Date: 10/24/2014  EKG Time: 10:30 AM  Rate: 97  Rhythm: normal sinus rhythm, RBBB  Axis: Normal  Intervals:right bundle branch block  ST&T Change: Nonspecific   ____________________________________________    RADIOLOGY  Bilateral pneumonia  ____________________________________________   PROCEDURES  Procedure(s) performed: None  Critical Care performed:  Yes CRITICAL CARE Performed by: Lavonia Drafts   Total critical care time:30  Critical care time was exclusive of separately billable procedures and treating other patients.  Critical care was necessary to treat or prevent imminent or life-threatening deterioration.  Critical care was time spent personally by me on the following activities: development of treatment plan with patient and/or surrogate as well as nursing, discussions with consultants, evaluation of patient's response to treatment, examination of patient, obtaining history from patient or surrogate, ordering and performing treatments and interventions, ordering and review of laboratory studies, ordering and review of radiographic studies, pulse oximetry and re-evaluation of patient's condition.     ____________________________________________   INITIAL IMPRESSION / ASSESSMENT AND PLAN / ED COURSE  Pertinent labs & imaging results that were available during my care of the patient were reviewed by me and considered in my medical decision making (see chart for details).  Patient's initial presentation consistent with severe COPD exacerbation and respiratory failure. She has received magnesium, slight Medrol IV, epi IM and is on BiPAP. We will continue nebulized treatments and check an ABG and an x-ray  ____________________________________________ ----------------------------------------- 11:27 AM on 10/24/2014 -----------------------------------------  X-ray consistent with bilateral pneumonia, patient also with elevated white blood cell count. Culture sent and Antibiotics ordered. We will admit to the hospitalist  FINAL CLINICAL IMPRESSION(S) / ED DIAGNOSES  Final diagnoses:  Community acquired pneumonia  Acute respiratory failure with hypoxia  COPD exacerbation     Lavonia Drafts, MD 10/24/14 1131

## 2014-10-24 NOTE — H&P (Signed)
[place] [facility] [place] NAME: Michelle [name]    MR#:  [id number]  DATE OF BIRTH:  1960/11/26  DATE OF ADMISSION:  10/24/2014  PRIMARY CARE PHYSICIAN: [facility]  REQUESTING/REFERRING PHYSICIAN: Dr. Corky Downs  CHIEF COMPLAINT:   Chief Complaint  Patient presents with  . Respiratory Distress    HISTORY OF PRESENT ILLNESS:  Michelle [name]  is a 54 y.o. female with a known history of COPD, CAD, tobacco and cocaine abuse presents to the emergency room with 2 days of worsening shortness of breath productive cough. She also noticed subjective fevers chills yesterday evening. EMS on initial contact the patient noticed that she was extremely hypoxic. She received IV Solu-Medrol and EpiPen IV magnesium and multiple Duonebs. Patient with severe respiratory distress and in tripod position is placed on BiPAP at this time.  Patient continues to smoke 5-10 cigarettes every day. Is not on home oxygen. Her last use of marijuana and cocaine for 2 days prior.  History has been obtained from patient, family at bedside, and reviewing old records. Radiology images reviewed and case discussed with ER physician Dr. Corky Downs.  PAST MEDICAL HISTORY:   Past Medical History  Diagnosis Date  . COPD (chronic obstructive pulmonary disease)   . Emphysema/COPD   . Hypertension   . High cholesterol   . Pneumothorax   . Coronary artery disease   . Cocaine abuse   . Tobacco abuse     PAST SURGICAL HISTORY:   Past Surgical History  Procedure Laterality Date  . Cardiac catheterization    . Chest tube insertion      SOCIAL HISTORY:   History  Substance Use Topics  . Smoking status: Current Every Day Smoker -- 0.50 packs/day    Types: Cigarettes  . Smokeless tobacco: Not on file  . Alcohol Use: No    FAMILY HISTORY:   Family History  Problem Relation Age of Onset  . Lung cancer Mother   . CVA Father   . CAD Father     DRUG ALLERGIES:   Allergies   Allergen Reactions  . No Known Allergies     REVIEW OF SYSTEMS:   Review of Systems  Constitutional: Positive for fever (subjective at home) and malaise/fatigue. Negative for chills and weight loss.  HENT: Negative for hearing loss and nosebleeds.   Eyes: Negative for blurred vision, double vision and pain.  Respiratory: Positive for cough, sputum production, shortness of breath and wheezing. Negative for hemoptysis.   Cardiovascular: Positive for chest pain (only on coughing). Negative for palpitations, orthopnea and leg swelling.  Gastrointestinal: Positive for constipation. Negative for nausea, vomiting, abdominal pain and diarrhea.  Genitourinary: Negative for dysuria and hematuria.  Musculoskeletal: Negative for myalgias, back pain and falls.  Skin: Negative for rash.  Neurological: Positive for weakness. Negative for dizziness, tremors, sensory change, speech change, focal weakness, seizures and headaches.  Endo/Heme/Allergies: Does not bruise/bleed easily.  Psychiatric/Behavioral: Negative for depression and memory loss. The patient is not nervous/anxious.     MEDICATIONS AT HOME:   Prior to Admission medications   Medication Sig Start Date End Date Taking? Authorizing Provider  albuterol (PROVENTIL HFA;VENTOLIN HFA) 108 (90 BASE) MCG/ACT inhaler Inhale 2 puffs into the lungs every 6 (six) hours as needed for wheezing or shortness of breath.   Yes Historical Provider, MD  aspirin EC 81 MG tablet Take 81 mg by mouth daily.   Yes Historical Provider, MD  enalapril (VASOTEC) 10 MG tablet Take  5 mg by mouth daily.   Yes Historical Provider, MD  Fluticasone-Salmeterol (ADVAIR) 250-50 MCG/DOSE AEPB Inhale 1 puff into the lungs 2 (two) times daily.   Yes Historical Provider, MD  tiotropium (SPIRIVA) 18 MCG inhalation capsule Place 18 mcg into inhaler and inhale daily.   Yes Historical Provider, MD      VITAL SIGNS:  Blood pressure 120/88, pulse 94, resp. rate 20, weight 60 kg (132  lb 4.4 oz), SpO2 98 %.  PHYSICAL EXAMINATION:  Physical Exam  GENERAL:  55 y.o.-year-old patient lying in the bed with acute resp distress on BiPAP EYES: Pupils equal, round, reactive to light and accommodation. No scleral icterus. Extraocular muscles intact.  HEENT: Head atraumatic, normocephalic. Oropharynx and nasopharynx clear. No oropharyngeal erythema, moist oral mucosa  NECK:  Supple, no jugular venous distention. No thyroid enlargement, no tenderness.  LUNGS: Increased work of breathing, using a considering her muscles. Decreased air entry bilaterally. No wheezing heard. CARDIOVASCULAR: S1, S2 normal. No murmurs, rubs, or gallops. Chest wall tenderness all over ABDOMEN: Soft, nontender, nondistended. Bowel sounds present. No organomegaly or mass.  EXTREMITIES: No pedal edema, cyanosis, or clubbing. + 2 pedal & radial pulses b/l.   NEUROLOGIC: Cranial nerves II through XII are intact. No focal Motor or sensory deficits appreciated b/l PSYCHIATRIC: The patient is alert and oriented x 3. Good affect.  SKIN: No obvious rash, lesion, or ulcer.   LABORATORY PANEL:   CBC  Recent Labs Lab 10/24/14 1030  WBC 18.0*  HGB 14.1  HCT 44.1  PLT 188   ------------------------------------------------------------------------------------------------------------------  Chemistries   Recent Labs Lab 10/24/14 1030  NA 137  K 3.5  CL 100*  CO2 28  GLUCOSE 154*  BUN 12  CREATININE 0.82  CALCIUM 8.8*  AST 18  ALT 11*  ALKPHOS 57  BILITOT 0.4   ------------------------------------------------------------------------------------------------------------------  Cardiac Enzymes  Recent Labs Lab 10/24/14 1030  TROPONINI 0.04*   ------------------------------------------------------------------------------------------------------------------  RADIOLOGY:  Dg Chest Portable 1 View  10/24/2014   CLINICAL DATA:  Fever and cough which began yesterday. Current history of COPD and  hypertension.  EXAM: PORTABLE CHEST - 1 VIEW  COMPARISON:  07/22/2014 and earlier.  FINDINGS: Cardiac silhouette normal in size, unchanged. Prominent central pulmonary arteries, left greater than right, unchanged. Emphysematous changes throughout both lungs with hyperinflation. Interval development of patchy airspace opacities in both lung bases, right greater than left, associated with small bilateral pleural effusions.  IMPRESSION: Bibasilar pneumonia superimposed upon COPD/emphysema with associated small bilateral pleural effusions.   Electronically Signed   By: Evangeline Dakin M.D.   On: 10/24/2014 11:04     IMPRESSION AND PLAN:   54 year old African-American female patient with history of COPD, CAD, tobacco and cocaine abuse presents to the emergency room with worsening shortness of breath and productive cough.  #1 Acute hypoxic respiratory failure with bilateral community acquired pneumonia and COPD exacerbation Patient will be admitted to stepdown unit. Place on BiPAP support. Critically ill and will need intubation if any further worsening. Start on IV Solu-Medrol, Levaquin, scheduled nebulizer therapy. Continue her home Advair and Spiriva. Blood cultures have been sent from the emergency room. Sputum culture ordered. Sepsis present on admission with elevated white count, tachypnea and tachycardia.  #2 CAD - Stable.  Tobacco abuse. Patient has been counseled for more than 3 minutes to quit smoking.  Cocaine abuse- Counselled to quit.    All the records are reviewed and case discussed with ED provider. Management plans discussed with the patient, family  and they are in agreement.  CODE STATUS: FULL CODE  TOTAL CRITICAL CARE TIME TAKING CARE OF THIS PATIENT: 40 minutes.   Hillary Bow R M.D on 10/24/2014 at 12:27 PM  Between 7am to 6pm - Pager - [phone]  After 6pm go to [email or web address] - password [email or web address] [facility]  [facility]  Office  [phone]  CC: Primary  care physician; [facility]

## 2014-10-24 NOTE — ED Notes (Signed)
Pt from home via [facility] in respiratory distress. EMS reports pt had fever and cough last night; reports little air movement by patient. Pt on CPCP upon arrival, pt tripoding. EMS reports giving 125mg  solumedrol IV, 2g mag IV, 0.3mg  epi IM and continuous nebs. Pt in apparent distress upon arrival. Pt placed on arrival.

## 2014-10-24 NOTE — Progress Notes (Signed)
ANTIBIOTIC CONSULT NOTE - INITIAL  Pharmacy Consult for Levaquin  Indication: COPD exacerbation  Allergies  Allergen Reactions  . No Known Allergies     Patient Measurements: Weight: 132 lb 4.4 oz (60 kg) Adjusted Body Weight:   Vital Signs: BP: 119/90 mmHg (05/22 1430) Pulse Rate: 87 (05/22 1430) Intake/Output from previous day:   Intake/Output from this shift:    Labs:  Recent Labs  10/24/14 1030  WBC 18.0*  HGB 14.1  PLT 188  CREATININE 0.82   CrCl cannot be calculated (Unknown ideal weight.). No results for input(s): VANCOTROUGH, VANCOPEAK, VANCORANDOM, GENTTROUGH, GENTPEAK, GENTRANDOM, TOBRATROUGH, TOBRAPEAK, TOBRARND, AMIKACINPEAK, AMIKACINTROU, AMIKACIN in the last 72 hours.   Microbiology: No results found for this or any previous visit (from the past 720 hour(s)).  Medical History: Past Medical History  Diagnosis Date  . COPD (chronic obstructive pulmonary disease)   . Emphysema/COPD   . Hypertension   . High cholesterol   . Pneumothorax   . Coronary artery disease   . Cocaine abuse   . Tobacco abuse     Medications:  Scheduled:  . aspirin EC  81 mg Oral Daily  . enalapril  5 mg Oral Daily  . enoxaparin (LOVENOX) injection  40 mg Subcutaneous Q24H  . levofloxacin (LEVAQUIN) IV  500 mg Intravenous Q24H  . methylPREDNISolone (SOLU-MEDROL) injection  60 mg Intravenous Q24H  . mometasone-formoterol  2 puff Inhalation BID  . sodium chloride  3 mL Intravenous Q12H  . tiotropium  18 mcg Inhalation Daily   Assessment: SrCr = 0.8  Goal of Therapy:    Plan:  Will order levaquin 500 mg IV Q24H.   Alaiyah Bollman D 10/24/2014,3:38 PM

## 2014-10-25 DIAGNOSIS — E440 Moderate protein-calorie malnutrition: Secondary | ICD-10-CM | POA: Insufficient documentation

## 2014-10-25 LAB — URINE DRUG SCREEN, QUALITATIVE (ARMC ONLY)
AMPHETAMINES, UR SCREEN: NOT DETECTED
BARBITURATES, UR SCREEN: NOT DETECTED
BENZODIAZEPINE, UR SCRN: NOT DETECTED
Cannabinoid 50 Ng, Ur ~~LOC~~: POSITIVE — AB
Cocaine Metabolite,Ur ~~LOC~~: POSITIVE — AB
MDMA (Ecstasy)Ur Screen: NOT DETECTED
Methadone Scn, Ur: NOT DETECTED
Opiate, Ur Screen: POSITIVE — AB
PHENCYCLIDINE (PCP) UR S: NOT DETECTED
TRICYCLIC, UR SCREEN: NOT DETECTED

## 2014-10-25 LAB — EXPECTORATED SPUTUM ASSESSMENT W REFEX TO RESP CULTURE

## 2014-10-25 LAB — CBC
HCT: 44.4 % (ref 35.0–47.0)
Hemoglobin: 14.2 g/dL (ref 12.0–16.0)
MCH: 27.8 pg (ref 26.0–34.0)
MCHC: 31.9 g/dL — AB (ref 32.0–36.0)
MCV: 87 fL (ref 80.0–100.0)
Platelets: 208 10*3/uL (ref 150–440)
RBC: 5.1 MIL/uL (ref 3.80–5.20)
RDW: 14.2 % (ref 11.5–14.5)
WBC: 15.4 10*3/uL — ABNORMAL HIGH (ref 3.6–11.0)

## 2014-10-25 LAB — EXPECTORATED SPUTUM ASSESSMENT W GRAM STAIN, RFLX TO RESP C

## 2014-10-25 MED ORDER — ALBUTEROL SULFATE (2.5 MG/3ML) 0.083% IN NEBU
2.5000 mg | INHALATION_SOLUTION | Freq: Four times a day (QID) | RESPIRATORY_TRACT | Status: DC | PRN
Start: 2014-10-25 — End: 2014-10-27

## 2014-10-25 MED ORDER — ENSURE ENLIVE PO LIQD
237.0000 mL | Freq: Two times a day (BID) | ORAL | Status: DC
  Administered 2014-10-25 – 2014-10-27 (×4): 237 mL via ORAL

## 2014-10-25 MED ORDER — ALBUTEROL SULFATE HFA 108 (90 BASE) MCG/ACT IN AERS: 2.0000 | INHALATION_SPRAY | Freq: Four times a day (QID) | RESPIRATORY_TRACT | Status: DC | PRN

## 2014-10-25 MED ORDER — HYDROCOD POLST-CPM POLST ER 10-8 MG/5ML PO SUER
5.0000 mL | Freq: Two times a day (BID) | ORAL | Status: DC | PRN
  Administered 2014-10-25 – 2014-10-26 (×3): 5 mL via ORAL
  Filled 2014-10-25 (×3): qty 5

## 2014-10-25 MED ORDER — ALPRAZOLAM 0.5 MG PO TABS
0.5000 mg | ORAL_TABLET | Freq: Two times a day (BID) | ORAL | Status: DC | PRN
  Administered 2014-10-25: 0.5 mg via ORAL
  Filled 2014-10-25 (×2): qty 1

## 2014-10-25 MED ORDER — BUDESONIDE 0.5 MG/2ML IN SUSP
0.5000 mg | Freq: Two times a day (BID) | RESPIRATORY_TRACT | Status: DC
  Administered 2014-10-25 – 2014-10-27 (×4): 0.5 mg via RESPIRATORY_TRACT
  Filled 2014-10-25 (×4): qty 2

## 2014-10-25 MED ORDER — ALBUTEROL SULFATE (2.5 MG/3ML) 0.083% IN NEBU
2.5000 mg | INHALATION_SOLUTION | RESPIRATORY_TRACT | Status: DC
  Administered 2014-10-25 – 2014-10-27 (×12): 2.5 mg via RESPIRATORY_TRACT
  Filled 2014-10-25 (×12): qty 3

## 2014-10-25 MED ORDER — IPRATROPIUM-ALBUTEROL 0.5-2.5 (3) MG/3ML IN SOLN: 3.0000 mL | RESPIRATORY_TRACT | Status: DC

## 2014-10-25 NOTE — Care Management (Signed)
Patient is without payor source.  She has been referred to [facility] but has not shown for any appointments.  A referral was made  [facility].  Patient has had positive urine drug screen in the past (cocaine).  UDS ordered by attending.  Patient will be transferred out of ICU to any med surg today

## 2014-10-25 NOTE — Progress Notes (Signed)
Pt is very anxious with tachycardia and tachypnea.  Dr. Mar Daring called and notified.  Orders obtained for xanax.

## 2014-10-25 NOTE — Progress Notes (Signed)
Initial Nutrition Assessment  DOCUMENTATION CODES:  Non-severe (moderate) malnutrition in context of chronic illness  INTERVENTION:   (Medical Nutrition Supplement: Recommend ensure enlive BID between meals. for added nutrition. Has been drinking PTA,Meals and snacks: adding ice cream BID with meals)  NUTRITION DIAGNOSIS:  Inadequate oral intake related to other (see comment) (taste alterations ) as evidenced by meal completion < 50%.    GOAL:  Patient will meet greater than or equal to 90% of their needs    MONITOR:   (Energy intake, Pulmonary profile, Anthropometric)  REASON FOR ASSESSMENT:  Malnutrition Screening Tool    ASSESSMENT:  Pt admitted with bilateral pneumonia, COPD exacerbation, shortness of breath.  Past Medical History  Diagnosis Date  . COPD (chronic obstructive pulmonary disease)   . Emphysema/COPD   . Hypertension   . High cholesterol   . Pneumothorax   . Coronary artery disease   . Cocaine abuse   . Tobacco abuse    Pt reports decreased intake for the last month, secondary to inhalers causing taste alterations per pt. Ate muffin this am and requesting fruit cup. Noted ate 75% of supper meal last night after coming off bipap   Height:  Ht Readings from Last 1 Encounters:  10/24/14 5' (1.524 m)    Weight: Note wt per admission 132 pounds, 96 pounds (measured times 3) and 87 pounds.  Pt reports thinks has lost about 5 pounds in the last 2 months (5% weight loss in 2 months)  Medications: dulcolax, solumedrol  Electrolyte and Renal Profile:    Recent Labs Lab 10/24/14 1030  BUN 12  CREATININE 0.82  NA 137  K 3.5   Glucose Profile:  Recent Labs  10/24/14 1517  GLUCAP 135*     Wt Readings from Last 1 Encounters:  10/25/14 87 lb 4.8 oz (39.599 kg)       Wt Readings from Last 10 Encounters:  10/25/14 87 lb 4.8 oz (39.599 kg)   Nutrition-Focused physical exam completed. Findings are mild in thoracic region all other  areas normal fat depletion, Mild in clavicle, patellar and thigh region muscle depletion, and no edema.    BMI:  Body mass index is 17.05 kg/(m^2).  Estimated Nutritional Needs:  Kcal:  BEE 966 kcals (IF 1.0-1.2, AF 1.3) 1255-1506 kcals/d  Protein:  (1.1-1.3 g/kg) 48-57 gm/d  Fluid:  (25-55ml/kg) 1100-1331ml/d  Skin:  Reviewed, no issues  Diet Order:  Diet Heart Room service appropriate?: Yes; Fluid consistency:: Thin  EDUCATION NEEDS:  No education needs identified at this time   Intake/Output Summary (Last 24 hours) at 10/25/14 0931 Last data filed at 10/25/14 0640  Gross per 24 hour  Intake   1220 ml  Output    750 ml  Net    470 ml      Moderate level of care Michelle [name] B. Zenia Resides, [place], [place] (pager)

## 2014-10-25 NOTE — Progress Notes (Signed)
[facility] at [place] NAME: Michelle [name]    MR#:  [id number]  DATE OF BIRTH:  1960/08/24  SUBJECTIVE:  CHIEF COMPLAINT:  Respiratory distress  Patient is resting comfortably today. Shortness of breath is significantly improved. Denies any wheezing. Denies any chest pain  REVIEW OF SYSTEMS:  CONSTITUTIONAL: No fever, fatigue or weakness.  EYES: No blurred or double vision.  EARS, NOSE, AND THROAT: No tinnitus or ear pain.  RESPIRATORY: No cough, reports shortness of breath with exertion, denies wheezing or hemoptysis.  CARDIOVASCULAR: No chest pain, orthopnea, edema.  GASTROINTESTINAL: No nausea, vomiting, diarrhea or abdominal pain.  GENITOURINARY: No dysuria, hematuria.  ENDOCRINE: No polyuria, nocturia,  HEMATOLOGY: No anemia, easy bruising or bleeding SKIN: No rash or lesion. MUSCULOSKELETAL: No joint pain or arthritis.   NEUROLOGIC: No tingling, numbness, weakness.  PSYCHIATRY: No anxiety or depression.   DRUG ALLERGIES:   Allergies  Allergen Reactions  . No Known Allergies     VITALS:  Blood pressure 127/72, pulse 89, temperature 98.3 F (36.8 C), temperature source Oral, resp. rate 18, height 5' (1.524 m), weight 39.599 kg (87 lb 4.8 oz), SpO2 95 %.  PHYSICAL EXAMINATION:  GENERAL:  54 y.o.-year-old patient lying in the bed with no acute distress.  EYES: Pupils equal, round, reactive to light and accommodation. No scleral icterus. Extraocular muscles intact.  HEENT: Head atraumatic, normocephalic. Oropharynx and nasopharynx clear.  NECK:  Supple, no jugular venous distention. No thyroid enlargement, no tenderness.  LUNGS: Normal breath sounds bilaterally, positive and expiratory wheezing, no rales,rhonchi or crepitation. No use of accessory muscles of respiration.  CARDIOVASCULAR: S1, S2 normal. No murmurs, rubs, or gallops.  ABDOMEN: Soft, nontender, nondistended. Bowel sounds present. No organomegaly or mass.   EXTREMITIES: No pedal edema, cyanosis, or clubbing.  NEUROLOGIC: Cranial nerves II through XII are intact. Muscle strength 5/5 in all extremities. Sensation intact. Gait not checked.  PSYCHIATRIC: The patient is alert and oriented x 3.  SKIN: No obvious rash, lesion, or ulcer.    LABORATORY PANEL:   CBC  Recent Labs Lab 10/25/14 0617  WBC 15.4*  HGB 14.2  HCT 44.4  PLT 208   ------------------------------------------------------------------------------------------------------------------  Chemistries   Recent Labs Lab 10/24/14 1030  NA 137  K 3.5  CL 100*  CO2 28  GLUCOSE 154*  BUN 12  CREATININE 0.82  CALCIUM 8.8*  AST 18  ALT 11*  ALKPHOS 57  BILITOT 0.4   ------------------------------------------------------------------------------------------------------------------  Cardiac Enzymes  Recent Labs Lab 10/24/14 1030  TROPONINI 0.04*   ------------------------------------------------------------------------------------------------------------------  RADIOLOGY:  Dg Chest Portable 1 View  10/24/2014   CLINICAL DATA:  Fever and cough which began yesterday. Current history of COPD and hypertension.  EXAM: PORTABLE CHEST - 1 VIEW  COMPARISON:  07/22/2014 and earlier.  FINDINGS: Cardiac silhouette normal in size, unchanged. Prominent central pulmonary arteries, left greater than right, unchanged. Emphysematous changes throughout both lungs with hyperinflation. Interval development of patchy airspace opacities in both lung bases, right greater than left, associated with small bilateral pleural effusions.  IMPRESSION: Bibasilar pneumonia superimposed upon COPD/emphysema with associated small bilateral pleural effusions.   Electronically Signed   By: Evangeline Dakin M.D.   On: 10/24/2014 11:04    EKG:   Orders placed or performed during the hospital encounter of 10/24/14  . ED EKG  . ED EKG  . EKG 12-Lead  . EKG 12-Lead    ASSESSMENT AND PLAN:  54 year old  African-American female patient with  history of COPD, CAD, tobacco and cocaine abuse presents to the emergency room with worsening shortness of breath and productive cough.  #  Acute hypoxic respiratory failure with bilateral community acquired pneumonia and COPD exacerbation Patient will be transferred to [place] unit as clinically feeling better . Patient is off BiPAP. Continue on IV Solu-Medrol, Levaquin, scheduled nebulizer therapy. Continue her home Advair and Spiriva. Blood cultures have been sent from the emergency room. Sputum culture ordered. Sepsis present on admission with elevated white count, tachypnea and tachycardia.  #  CAD - Stable.  # Tobacco abuse. Patient has been counseled for more than 3 minutes to quit smoking.  # Cocaine abuse- Counselled to quit.  # Moderate malnutrition next and will provide nutritional supplements as recommended by dietitian  All the records are reviewed and case discussed with Care Management/Social Workerr. Management plans discussed with the patient, family and they are in agreement.  CODE STATUS: Full code  TOTAL TIME TAKING CARE OF THIS PATIENT: 40 minutes.   POSSIBLE D/C IN 2-3 DAYS, DEPENDING ON CLINICAL CONDITION.   Nicholes Mango M.D on 10/25/2014 at 2:44 PM  Between 7am to 6pm - Pager - [phone] After 6pm go to [email or web address] - password [email or web address] [facility]  [place] [facility]  Office  [phone]  CC: Primary care physician; No primary care provider on file.

## 2014-10-26 ENCOUNTER — Inpatient Hospital Stay: Payer: Self-pay

## 2014-10-26 LAB — BASIC METABOLIC PANEL
ANION GAP: 7 (ref 5–15)
BUN: 21 mg/dL — ABNORMAL HIGH (ref 6–20)
CALCIUM: 8.9 mg/dL (ref 8.9–10.3)
CHLORIDE: 105 mmol/L (ref 101–111)
CO2: 28 mmol/L (ref 22–32)
Creatinine, Ser: 0.7 mg/dL (ref 0.44–1.00)
Glucose, Bld: 139 mg/dL — ABNORMAL HIGH (ref 65–99)
POTASSIUM: 3.8 mmol/L (ref 3.5–5.1)
SODIUM: 140 mmol/L (ref 135–145)

## 2014-10-26 LAB — CBC
HCT: 38.8 % (ref 35.0–47.0)
HEMOGLOBIN: 12.4 g/dL (ref 12.0–16.0)
MCH: 27.9 pg (ref 26.0–34.0)
MCHC: 32.1 g/dL (ref 32.0–36.0)
MCV: 87 fL (ref 80.0–100.0)
Platelets: 214 10*3/uL (ref 150–440)
RBC: 4.47 MIL/uL (ref 3.80–5.20)
RDW: 14 % (ref 11.5–14.5)
WBC: 11.5 10*3/uL — AB (ref 3.6–11.0)

## 2014-10-26 MED ORDER — FLEET ENEMA 7-19 GM/118ML RE ENEM: 1.0000 | ENEMA | Freq: Once | RECTAL | Status: DC

## 2014-10-26 MED ORDER — GUAIFENESIN-DM 100-10 MG/5ML PO SYRP
10.0000 mL | ORAL_SOLUTION | Freq: Three times a day (TID) | ORAL | Status: DC | PRN
  Administered 2014-10-26: 10 mL via ORAL
  Filled 2014-10-26: qty 10

## 2014-10-26 MED ORDER — LACTULOSE 10 GM/15ML PO SOLN
20.0000 g | Freq: Two times a day (BID) | ORAL | Status: DC | PRN
  Administered 2014-10-26: 20 g via ORAL
  Filled 2014-10-26: qty 30

## 2014-10-26 MED ORDER — PREDNISONE 50 MG PO TABS
50.0000 mg | ORAL_TABLET | Freq: Every day | ORAL | Status: DC
  Administered 2014-10-26 – 2014-10-27 (×2): 50 mg via ORAL
  Filled 2014-10-26 (×2): qty 1

## 2014-10-26 NOTE — Progress Notes (Signed)
[place] at [place] NAME: Berenize Gatlin    MR#:  [id number]  DATE OF BIRTH:  1960/11/27  SUBJECTIVE:  CHIEF COMPLAINT:  Respiratory distress  Patient is resting comfortably today. Shortness of breath with exertion  Denies any wheezing. Denies any chest pain  REVIEW OF SYSTEMS:  CONSTITUTIONAL: No fever, fatigue or weakness.  EYES: No blurred or double vision.  EARS, NOSE, AND THROAT: No tinnitus or ear pain.  RESPIRATORY: No cough, reports shortness of breath with exertion, denies wheezing or hemoptysis.  CARDIOVASCULAR: No chest pain, orthopnea, edema.  GASTROINTESTINAL: No nausea, vomiting, diarrhea or abdominal pain.  GENITOURINARY: No dysuria, hematuria.  ENDOCRINE: No polyuria, nocturia,  HEMATOLOGY: No anemia, easy bruising or bleeding SKIN: No rash or lesion. MUSCULOSKELETAL: No joint pain or arthritis.   NEUROLOGIC: No tingling, numbness, weakness.  PSYCHIATRY: No anxiety or depression.   DRUG ALLERGIES:   Allergies  Allergen Reactions  . No Known Allergies     VITALS:  Blood pressure 117/62, pulse 90, temperature 98 F (36.7 C), temperature source Oral, resp. rate 16, height 5' (1.524 m), weight 46.176 kg (101 lb 12.8 oz), SpO2 94 %.  PHYSICAL EXAMINATION:  GENERAL:  54 y.o.-year-old patient lying in the bed with no acute distress.  EYES: Pupils equal, round, reactive to light and accommodation. No scleral icterus. Extraocular muscles intact.  HEENT: Head atraumatic, normocephalic. Oropharynx and nasopharynx clear.  NECK:  Supple, no jugular venous distention. No thyroid enlargement, no tenderness.  LUNGS: Normal breath sounds bilaterally, positive and expiratory wheezing, no rales,rhonchi or crepitation. No use of accessory muscles of respiration.  CARDIOVASCULAR: S1, S2 normal. No murmurs, rubs, or gallops.  ABDOMEN: Soft, nontender, nondistended. Bowel sounds present. No organomegaly or mass.  EXTREMITIES: No  pedal edema, cyanosis, or clubbing.  NEUROLOGIC: Cranial nerves II through XII are intact. Muscle strength 5/5 in all extremities. Sensation intact. Gait not checked.  PSYCHIATRIC: The patient is alert and oriented x 3.  SKIN: No obvious rash, lesion, or ulcer.    LABORATORY PANEL:   CBC  Recent Labs Lab 10/26/14 0458  WBC 11.5*  HGB 12.4  HCT 38.8  PLT 214   ------------------------------------------------------------------------------------------------------------------  Chemistries   Recent Labs Lab 10/24/14 1030 10/26/14 0458  NA 137 140  K 3.5 3.8  CL 100* 105  CO2 28 28  GLUCOSE 154* 139*  BUN 12 21*  CREATININE 0.82 0.70  CALCIUM 8.8* 8.9  AST 18  --   ALT 11*  --   ALKPHOS 57  --   BILITOT 0.4  --    ------------------------------------------------------------------------------------------------------------------  Cardiac Enzymes  Recent Labs Lab 10/24/14 1030  TROPONINI 0.04*   ------------------------------------------------------------------------------------------------------------------  RADIOLOGY:  No results found.  EKG:   Orders placed or performed during the hospital encounter of 10/24/14  . ED EKG  . ED EKG  . EKG 12-Lead  . EKG 12-Lead    ASSESSMENT AND PLAN:  54 year old African-American female patient with history of COPD, CAD, tobacco and cocaine abuse presents to the emergency room with worsening shortness of breath and productive cough.  #  Acute hypoxic respiratory failure with bilateral community acquired pneumonia and COPD exacerbation Patient is off BiPAP.  Change IV Solu-Medrol to by mouth prednisone, get ambulatory pulse ox, wean off oxygen to room air as tolerated  Levaquin, scheduled nebulizer therapy.  Continue her home Advair and Spiriva.  Blood cultures are negative so far . Sputum culture pending.  Sepsis present on admission  with elevated white count, tachypnea and tachycardia. Will get repeat chest x-ray,  get a.m. labs  #  CAD - Stable.  # Tobacco abuse. Patient has been counseled for more than 3 minutes to quit smoking.  # Cocaine abuse- Counselled to quit.  # Moderate malnutrition next and will provide nutritional supplements as recommended by dietitian  # Generalized weakness-PT evaluation  All the records are reviewed and case discussed with Care Management/Social Workerr. Management plans discussed with the patient, family and they are in agreement.  CODE STATUS: Full code  TOTAL TIME TAKING CARE OF THIS PATIENT: 35 minutes.   POSSIBLE D/C IN 2-3 DAYS, DEPENDING ON CLINICAL CONDITION.   Nicholes Mango M.D on 10/26/2014 at 2:08 PM  Between 7am to 6pm - Pager - [phone] After 6pm go to [email or web address] - password [email or web address] [facility]  [place] [facility]  Office  [phone]  CC: Primary care physician; No primary care provider on file.

## 2014-10-26 NOTE — Progress Notes (Signed)
Pt. Alert and oriented. VSS. Up to bathroom with stand-by assist. Running SR per monitor. Pt. C/o cough last night. Cough medicine ordered and pt. Rested quietly throughout the night. Pills whole with water. No c/o pain.

## 2014-10-27 LAB — BASIC METABOLIC PANEL
Anion gap: 5 (ref 5–15)
BUN: 20 mg/dL (ref 6–20)
CALCIUM: 9 mg/dL (ref 8.9–10.3)
CO2: 27 mmol/L (ref 22–32)
CREATININE: 0.63 mg/dL (ref 0.44–1.00)
Chloride: 107 mmol/L (ref 101–111)
GFR calc Af Amer: >60 mL/min (ref >60)
GFR calc non Af Amer: >60 mL/min (ref >60)
GLUCOSE: 153 mg/dL — AB (ref 65–99)
POTASSIUM: 4.3 mmol/L (ref 3.5–5.1)
Sodium: 139 mmol/L (ref 135–145)

## 2014-10-27 LAB — CBC
HEMATOCRIT: 39.6 % (ref 35.0–47.0)
HEMOGLOBIN: 12.8 g/dL (ref 12.0–16.0)
MCH: 28 pg (ref 26.0–34.0)
MCHC: 32.3 g/dL (ref 32.0–36.0)
MCV: 86.9 fL (ref 80.0–100.0)
Platelets: 240 10*3/uL (ref 150–440)
RBC: 4.55 MIL/uL (ref 3.80–5.20)
RDW: 14.2 % (ref 11.5–14.5)
WBC: 6.6 10*3/uL (ref 3.6–11.0)

## 2014-10-27 MED ORDER — PREDNISONE 10 MG (21) PO TBPK: 10.0000 mg | ORAL_TABLET | Freq: Every day | ORAL | Status: DC

## 2014-10-27 MED ORDER — ENSURE ENLIVE PO LIQD: 237.0000 mL | Freq: Two times a day (BID) | ORAL | Status: DC

## 2014-10-27 MED ORDER — ENALAPRIL MALEATE 5 MG PO TABS
5.0000 mg | ORAL_TABLET | Freq: Once | ORAL | Status: AC
  Administered 2014-10-27: 5 mg via ORAL
  Filled 2014-10-27: qty 1

## 2014-10-27 MED ORDER — LEVOFLOXACIN 500 MG PO TABS: 500.0000 mg | ORAL_TABLET | Freq: Every day | ORAL | Status: DC

## 2014-10-27 MED ORDER — ENALAPRIL MALEATE 10 MG PO TABS: 10.0000 mg | ORAL_TABLET | Freq: Every day | ORAL | Status: DC

## 2014-10-27 MED ORDER — GUAIFENESIN-DM 100-10 MG/5ML PO SYRP: 10.0000 mL | ORAL_SOLUTION | Freq: Three times a day (TID) | ORAL | Status: DC | PRN

## 2014-10-27 MED ORDER — LEVOFLOXACIN 500 MG PO TABS: 500.0000 mg | ORAL_TABLET | Freq: Every day | ORAL | Status: AC

## 2014-10-27 NOTE — Progress Notes (Signed)
Pt. Alert and oriented. VSS. No c/o pain. Medicated for cough and insomnia. Pills whole with water. Up to [facility] with stand-by assist. Rested quietly throughout the night.

## 2014-10-27 NOTE — Care Management Note (Signed)
Case Management Note  Patient Details  Name: Michelle [name] MRN: [id number] Date of Birth: 08/30/1960  Subjective/Objective:    Spoke with pt and she reports she is active with [facility] and was last seen in April, 2016. She also [facility] and is able to get her medications filled at discharge. No additional needs identified. Does not qualify for home health as she has no payor.                Action/Plan:   Expected Discharge Date:   10/27/2014               Expected Discharge Plan:  Home/Self Care  In-House Referral:     Discharge planning Services  CM Consult  Post Acute Care Choice:    Choice offered to:     DME Arranged:    DME Agency:     HH Arranged:    HH Agency:     Status of Service:  Completed, signed off  Medicare Important Message Given:    Date Medicare IM Given:    Medicare IM give by:    Date Additional Medicare IM Given:    Additional Medicare Important Message give by:     If discussed at [place] of Stay Meetings, dates discussed:    Additional Comments:  Jolly Mango, RN 10/27/2014, 11:31 AM

## 2014-10-27 NOTE — Evaluation (Signed)
Physical Therapy Evaluation Patient Details Name: Michelle [name] MRN: [id number] DOB: Sep 24, 1960 Today's Date: 10/27/2014   History of Present Illness  presented to ER secondary to SOB, cough x2 days; admitted with acute respiratory failure related to bilat PNA, COPD exacerbation.  Initially requiring BiPAP upon arrival, currently weaned to RA and tolerating without desat.  Clinical Impression  Upon evaluation, patient alert and oriented to all information; follows all commands and demonstrates fair/good safety awareness and insight.  Strength and ROM grossly WFL and symmetrical; denies pain.  Able to complete bed mobility and sit/stand indep; basic transfers and gait (220') without assist device, distant sup/mod indep.  Occasional sway, but patient able to self-correct.  Maintains sats >92% on RA at rest and with activity; BORG 6/10 after above gait distance.  Verbally reviewed techniques for activity pacing/energy conservation; patient voiced understanding, but question full agreement and integration into activity. Would benefit from skilled PT to address above deficits and promote optimal return to PLOF; will see 1-2 additional times during hospitalization to reinforce activity pacing and cardiopulmonary deficits.    Follow Up Recommendations No PT follow up (may be appropriate outpatient pulmonary rehab)    Equipment Recommendations       Recommendations for Other Services       Precautions / Restrictions Precautions Precautions: None Restrictions Weight Bearing Restrictions: No      Mobility  Bed Mobility Overal bed mobility: Independent                Transfers Overall transfer level: Modified independent                  Ambulation/Gait Ambulation/Gait assistance: Supervision;Modified independent (Device/Increase time)   Assistive device: None     Gait velocity interpretation: <1.8 ft/sec, indicative of risk for recurrent falls General Gait Details:  reciprocal stepping pattern with fair/good step height/length, fair cadence and gait speed.  BORG 6/10 after above distance requiring 2-3 min for return to baseline.  Sats remain >92% on RA throughout.  Stairs Stairs: Yes Stairs assistance: Supervision Stair Management: Two rails Number of Stairs: 4 General stair comments: reciprocal stepping without buckling/LOB  Wheelchair Mobility    Modified Rankin (Stroke Patients Only)       Balance Overall balance assessment: Modified Independent                                           Pertinent Vitals/Pain Pain Assessment: No/denies pain    Home Living Family/patient expects to be discharged to:: Private residence Living Arrangements: Children Available Help at Discharge: Family Type of Home: House Home Access: Stairs to enter Entrance Stairs-Rails: Can reach both Entrance Stairs-Number of Steps: 4 Home Layout: One level        Prior Function Level of Independence: Independent         Comments: Indep with all household/community mobility; + driving     Hand Dominance        Extremity/Trunk Assessment   Upper Extremity Assessment: Overall WFL for tasks assessed           Lower Extremity Assessment: Overall WFL for tasks assessed      Cervical / Trunk Assessment: Normal  Communication   Communication: No difficulties  Cognition Arousal/Alertness: Awake/alert Behavior During Therapy: WFL for tasks assessed/performed Overall Cognitive Status: Within Functional Limits for tasks assessed  General Comments      Exercises        Assessment/Plan    PT Assessment Patient needs continued PT services  PT Diagnosis Generalized weakness   PT Problem List Decreased activity tolerance;Decreased balance;Cardiopulmonary status limiting activity  PT Treatment Interventions Gait training;Stair training;Functional mobility training;Other (comment) (cardiopulmonary  endurance training)   PT Goals (Current goals can be found in the Care Plan section) Acute Rehab PT Goals Patient Stated Goal: "to go home today" PT Goal Formulation: With patient Time For Goal Achievement: 11/03/14 Potential to Achieve Goals: Good Additional Goals Additional Goal #1: Indep with energy conservation and activity pacing techniques to maximize oxygenation with all functional activities.    Frequency Min 2X/week   Barriers to discharge        Co-evaluation               End of Session Equipment Utilized During Treatment: Gait belt Activity Tolerance: Patient tolerated treatment well Patient left: in bed;with call bell/phone within reach           Time: 0937-0950 PT Time Calculation (min) (ACUTE ONLY): 13 min   Charges:   PT Evaluation $Initial PT Evaluation Tier I: 1 Procedure     PT G Codes:        Arlene Brickel H. Owens Shark, PT, DPT 10/27/2014, 10:11 AM [phone]

## 2014-10-27 NOTE — Progress Notes (Signed)
ANTIBIOTIC CONSULT NOTE - FOLLOW UP  Pharmacy Consult for Levaquin Indication: pneumonia  Allergies  Allergen Reactions  . No Known Allergies     Patient Measurements: Height: 5' (152.4 cm) Weight: 101 lb 12.8 oz (46.176 kg) IBW/kg (Calculated) : 45.5   Vital Signs: Temp: 97.6 F (36.4 C) (05/25 0848) Temp Source: Oral (05/25 0848) BP: 173/94 mmHg (05/25 0848) Pulse Rate: 82 (05/25 0822) Intake/Output from previous day: 05/24 0701 - 05/25 0700 In: 1020 [P.O.:920; IV Piggyback:100] Out: -  Intake/Output from this shift:    Labs:  Recent Labs  10/24/14 1030 10/25/14 0617 10/26/14 0458 10/27/14 0430  WBC 18.0* 15.4* 11.5* 6.6  HGB 14.1 14.2 12.4 12.8  PLT 188 208 214 240  CREATININE 0.82  --  0.70 0.63   Estimated Creatinine Clearance: 58.4 mL/min (by C-G formula based on Cr of 0.63).   Microbiology: Recent Results (from the past 720 hour(s))  Culture, blood (routine x 2)     Status: None (Preliminary result)   Collection Time: 10/24/14 10:30 AM  Result Value Ref Range Status   Specimen Description BLOOD  Final   Special Requests NONE  Final   Culture NO GROWTH 2 DAYS  Final   Report Status PENDING  Incomplete  Culture, blood (routine x 2)     Status: None (Preliminary result)   Collection Time: 10/24/14 10:31 AM  Result Value Ref Range Status   Specimen Description BLOOD  Final   Special Requests NONE  Final   Culture NO GROWTH 2 DAYS  Final   Report Status PENDING  Incomplete  Culture, sputum-assessment     Status: None   Collection Time: 10/25/14  6:25 AM  Result Value Ref Range Status   Specimen Description SPUTUM  Final   Special Requests NONE  Final   Sputum evaluation THIS SPECIMEN IS ACCEPTABLE FOR SPUTUM CULTURE  Final   Report Status 10/25/2014 FINAL  Final  Culture, respiratory (NON-Expectorated)     Status: None (Preliminary result)   Collection Time: 10/25/14  6:25 AM  Result Value Ref Range Status   Specimen Description SPUTUM  Final    Special Requests NONE Reflexed from [id number]  Final   Gram Stain PENDING  Incomplete   Culture HOLDING FOR POSSIBLE PATHOGEN  Final   Report Status PENDING  Incomplete    Anti-infectives    Start     Dose/Rate Route Frequency Ordered Stop   10/27/14 1800  levofloxacin (LEVAQUIN) tablet 500 mg     500 mg Oral Daily 10/27/14 0913     10/24/14 1800  levofloxacin (LEVAQUIN) IVPB 500 mg  Status:  Discontinued     500 mg 100 mL/hr over 60 Minutes Intravenous Every 24 hours 10/24/14 1538 10/27/14 0913   10/24/14 1142  dextrose 5 % with azithromycin (ZITHROMAX) ADS Med    Comments:  Lourdes Sledge: cabinet override      10/24/14 1142 10/24/14 2344   10/24/14 1142  dextrose 5 % with cefTRIAXone (ROCEPHIN) ADS Med    Comments:  Lourdes Sledge: cabinet override      10/24/14 1142 10/24/14 2344   10/24/14 1130  cefTRIAXone (ROCEPHIN) 1 g in dextrose 5 % 50 mL IVPB     1 g 100 mL/hr over 30 Minutes Intravenous  Once 10/24/14 1115 10/24/14 1233   10/24/14 1130  azithromycin (ZITHROMAX) 500 mg in dextrose 5 % 250 mL IVPB     500 mg 250 mL/hr over 60 Minutes Intravenous  Once 10/24/14 1115 10/24/14 1336  Assessment: Patient is a 54 yo female admitted for treatment of COPD exacerbation.  Currently ordered Levaquin 500 mg IV q24h.  Goal of Therapy:  Eradication of infection  Plan:  Will transition patient to Levaquin 500 mg po daily as patient meets following criteria per IV to Po policy:   Patient being treated for a respiratory tract infection, urinary tract infection, cellulitis or clostridium difficile associated diarrhea if on metronidazole  The patient is not neutropenic and does not exhibit a GI malabsorption state  The patient is eating (either orally or via tube) and/or has been taking other orally administered medications for a least 24 hours  The patient is improving clinically and has a Tmax < 100.5  If you have questions about this conversion, please contact the Pharmacy  Department  []   [phone] )  [facility] []   [phone] )  [facility] []   [phone] )  [facility] []   [phone] )  [facility] []   [phone] )  [place], PharmD Clinical Pharmacist 10/27/2014

## 2014-10-28 LAB — CULTURE, RESPIRATORY

## 2014-10-28 LAB — CULTURE, RESPIRATORY W GRAM STAIN

## 2014-10-29 LAB — CULTURE, BLOOD (ROUTINE X 2)
CULTURE: NO GROWTH
Culture: NO GROWTH

## 2014-11-03 IMAGING — CR DG CHEST 1V PORT
1 series · 1 of 1 positions shown · non-contrast
Comparison: June 29, 2013

CLINICAL DATA: Shortness of Breath

EXAM:
PORTABLE CHEST - 1 VIEW

[ap]
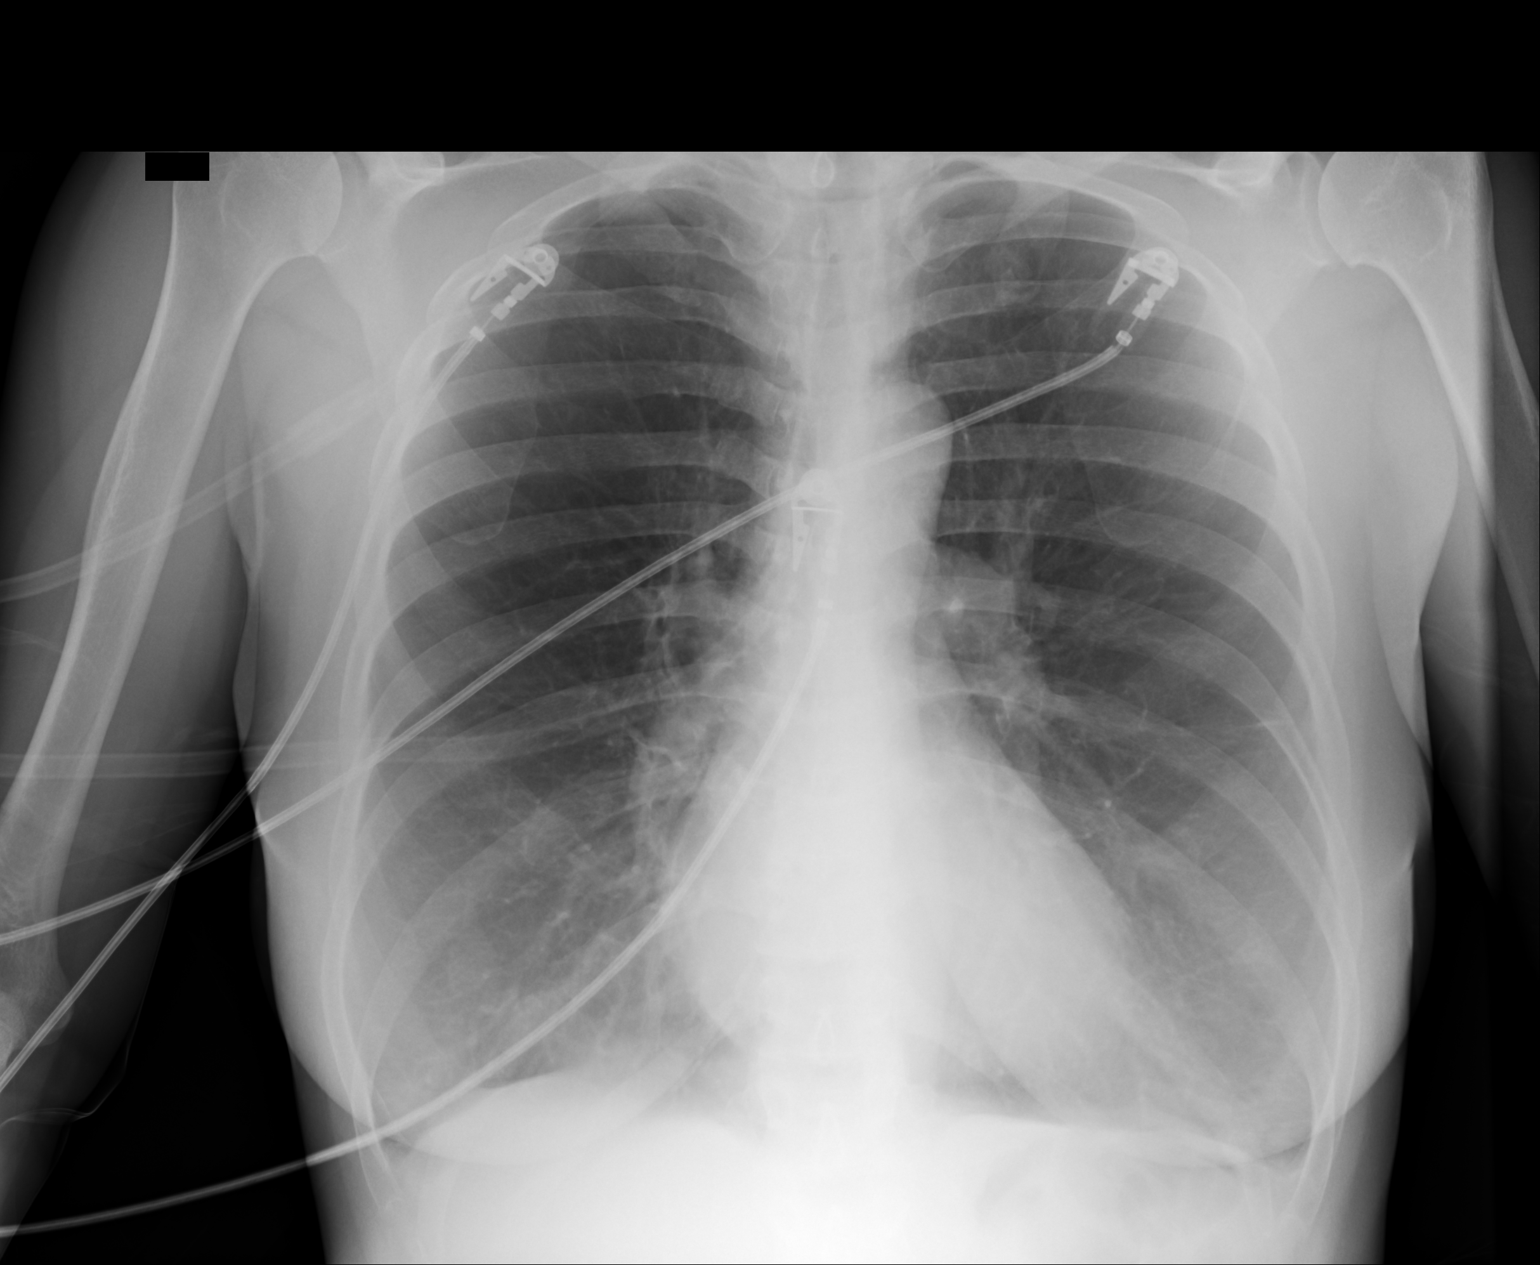

[1 of 1 positions shown; findings below may reference images not displayed]

FINDINGS: There is underlying emphysema. There is a small area of infiltrate
in the left base. Lungs elsewhere are clear. Heart size is normal.
The pulmonary vascularity reflects underlying emphysema. No
adenopathy.
IMPRESSION: Underlying emphysema.  Small area of infiltrate left base.

## 2014-11-04 NOTE — Discharge Summary (Signed)
Michelle [name] at [place] NAME: Michelle [name]    MR#:  [id number]  DATE OF BIRTH:  11-20-1960  DATE OF ADMISSION:  10/24/2014 ADMITTING PHYSICIAN: Hillary Bow, MD  DATE OF DISCHARGE: 10/27/2014  1:30 PM  PRIMARY CARE PHYSICIAN: No primary care provider on file.    ADMISSION DIAGNOSIS:  Community acquired pneumonia [J18.9] COPD exacerbation [J44.1] Acute respiratory failure with hypoxia [J96.01]  DISCHARGE DIAGNOSIS:  Active Problems:   COPD exacerbation   Acute respiratory failure with hypoxia   Tobacco abuse   Community acquired pneumonia   Malnutrition of moderate degree   SECONDARY DIAGNOSIS:   Past Medical History  Diagnosis Date  . COPD (chronic obstructive pulmonary disease)   . Emphysema/COPD   . Hypertension   . High cholesterol   . Pneumothorax   . Coronary artery disease   . Cocaine abuse   . Tobacco abuse     HOSPITAL COURSE:  Brief History and physical: Michelle [name] is a 54 y.o. female with a known history of COPD, CAD, tobacco and cocaine abuse presents to the emergency room with 2 days of worsening shortness of breath productive cough. She also noticed subjective fevers chills yesterday evening. EMS on initial contact the patient noticed that she was extremely hypoxic. She received IV Solu-Medrol and EpiPen IV magnesium and multiple Duonebs. Patient with severe respiratory distress and in tripod position is placed on BiPAP at this time. Patient continues to smoke 5-10 cigarettes every day. Is not on home oxygen. Her last use of marijuana and cocaine for 2 days prior  Hospital course:  # Acute hypoxic respiratory failure with bilateral community acquired pneumonia and COPD exacerbation Patient is off BiPAP.   IV Solu-Medrol given with nebs, pt clinically improved, iv steroids changed to by mouth prednisone, weaned off oxygen to room air as tolerated Levaquin given and  Provided scheduled nebulizer therapy.   Continued her home Advair and Spiriva.  Blood cultures are negative  . Sputum culture pending.  Sepsis present on admission with elevated white count, tachypnea and tachycardia.  # CAD - Stable.  # Tobacco abuse. Patient has been counseled for more than 3 minutes to quit smoking.  # Cocaine abuse- Counselled to quit.  # Moderate malnutrition next and will provide nutritional supplements as recommended by dietitian  # Generalized weakness-PT evaluation recs home health     DISCHARGE CONDITIONS:  satisfactory  CONSULTS OBTAINED:      PROCEDURES - none  DRUG ALLERGIES:   Allergies  Allergen Reactions  . No Known Allergies     DISCHARGE MEDICATIONS:   Discharge Medication List as of 10/27/2014 12:35 PM    START taking these medications   Details  feeding supplement, ENSURE ENLIVE, (ENSURE ENLIVE) LIQD Take 237 mLs by mouth 2 (two) times daily between meals., Starting 10/27/2014, Until Discontinued, OTC    guaiFENesin-dextromethorphan (ROBITUSSIN DM) 100-10 MG/5ML syrup Take 10 mLs by mouth every 8 (eight) hours as needed for cough., Starting 10/27/2014, Until Discontinued, OTC    levofloxacin (LEVAQUIN) 500 MG tablet Take 1 tablet (500 mg total) by mouth daily., Starting 10/27/2014, Until Mon 11/01/14, Print    predniSONE (STERAPRED UNI-PAK 21 TAB) 10 MG (21) TBPK tablet Take 1 tablet (10 mg total) by mouth daily. Take 6 tablets by mouth for 1 day followed by  5 tablets by mouth for 1 day followed by  4 tablets by mouth for 1 day followed by  3 tablets by mouth for 1  day followed by  2 tablets by mouth for 1 day foll owed by  1 tablet by mouth for a day and stop, Starting 10/27/2014, Until Discontinued, Print      CONTINUE these medications which have CHANGED   Details  enalapril (VASOTEC) 10 MG tablet Take 1 tablet (10 mg total) by mouth daily., Starting 10/28/2014, Until Discontinued, Print      CONTINUE these medications which have NOT CHANGED   Details   albuterol (PROVENTIL HFA;VENTOLIN HFA) 108 (90 BASE) MCG/ACT inhaler Inhale 2 puffs into the lungs every 6 (six) hours as needed for wheezing or shortness of breath., Until Discontinued, Historical Med    aspirin EC 81 MG tablet Take 81 mg by mouth daily., Until Discontinued, Historical Med    Fluticasone-Salmeterol (ADVAIR) 250-50 MCG/DOSE AEPB Inhale 1 puff into the lungs 2 (two) times daily., Until Discontinued, Historical Med    tiotropium (SPIRIVA) 18 MCG inhalation capsule Place 18 mcg into inhaler and inhale daily., Until Discontinued, Historical Med         DISCHARGE INSTRUCTIONS:   F/u with pcp in a week  DIET:  Aha diet  DISCHARGE CONDITION:  satisfactory  ACTIVITY:  As tolerated  OXYGEN:  Home Oxygen: no     DISCHARGE LOCATION:  home  If you experience worsening of your admission symptoms, develop shortness of breath, life threatening emergency, suicidal or homicidal thoughts you must seek medical attention immediately by calling 911 or calling your MD immediately  if symptoms less severe.  You Must read complete instructions/literature along with all the possible adverse reactions/side effects for all the Medicines you take and that have been prescribed to you. Take any new Medicines after you have completely understood and accpet all the possible adverse reactions/side effects.   Please note  You were cared for by a hospitalist during your hospital stay. If you have any questions about your discharge medications or the care you received while you were in the hospital after you are discharged, you can call the unit and asked to speak with the hospitalist on call if the hospitalist that took care of you is not available. Once you are discharged, your primary care physician will handle any further medical issues. Please note that NO REFILLS for any discharge medications will be authorized once you are discharged, as it is imperative that you return to your primary  care physician (or establish a relationship with a primary care physician if you do not have one) for your aftercare needs so that they can reassess your need for medications and monitor your lab values.     Today  Chief Complaint  Patient presents with  . Respiratory Distress   Pt is feeling fine  ROS:  CONSTITUTIONAL: Denies fevers, chills. Denies any fatigue, weakness.  EYES: Denies blurry vision, double vision, eye pain. EARS, NOSE, THROAT: Denies tinnitus, ear pain, hearing loss. RESPIRATORY: Denies cough, wheeze, shortness of breath.  CARDIOVASCULAR: Denies chest pain, palpitations, edema.  GASTROINTESTINAL: Denies nausea, vomiting, diarrhea, abdominal pain. Denies bright red blood per rectum. GENITOURINARY: Denies dysuria, hematuria. ENDOCRINE: Denies nocturia or thyroid problems. HEMATOLOGIC AND LYMPHATIC: Denies easy bruising or bleeding. SKIN: Denies rash or lesion. MUSCULOSKELETAL: Denies pain in neck, back, shoulder, knees, hips or arthritic symptoms.  NEUROLOGIC: Denies paralysis, paresthesias.  PSYCHIATRIC: Denies anxiety or depressive symptoms.   VITAL SIGNS:  Blood pressure 148/71, pulse 84, temperature 97.8 F (36.6 C), temperature source Oral, resp. rate 18, height 5' (1.524 m), weight 46.176 kg (101 lb 12.8  oz), SpO2 96 %.  I/O:  No intake or output data in the 24 hours ending 11/04/14 2242  PHYSICAL EXAMINATION:  GENERAL:  53 y.o.-year-old patient lying in the bed with no acute distress.  EYES: Pupils equal, round, reactive to light and accommodation. No scleral icterus. Extraocular muscles intact.  HEENT: Head atraumatic, normocephalic. Oropharynx and nasopharynx clear.  NECK:  Supple, no jugular venous distention. No thyroid enlargement, no tenderness.  LUNGS: Normal breath sounds bilaterally, no wheezing, rales,rhonchi or crepitation. No use of accessory muscles of respiration.  CARDIOVASCULAR: S1, S2 normal. No murmurs, rubs, or gallops.  ABDOMEN:  Soft, non-tender, non-distended. Bowel sounds present. No organomegaly or mass.  EXTREMITIES: No pedal edema, cyanosis, or clubbing.  NEUROLOGIC: Cranial nerves II through XII are intact. Muscle strength 5/5 in all extremities. Sensation intact. Gait not checked.  PSYCHIATRIC: The patient is alert and oriented x 3.  SKIN: No obvious rash, lesion, or ulcer.   DATA REVIEW:   CBC No results for input(s): WBC, HGB, HCT, PLT in the last 168 hours.  Chemistries  No results for input(s): NA, K, CL, CO2, GLUCOSE, BUN, CREATININE, CALCIUM, MG, AST, ALT, ALKPHOS, BILITOT in the last 168 hours.  Invalid input(s): GFRCGP  Cardiac Enzymes No results for input(s): TROPONINI in the last 168 hours.  Microbiology Results  Results for orders placed or performed during the hospital encounter of 10/24/14  Culture, blood (routine x 2)     Status: None   Collection Time: 10/24/14 10:30 AM  Result Value Ref Range Status   Specimen Description BLOOD  Final   Special Requests NONE  Final   Culture NO GROWTH 5 DAYS  Final   Report Status 10/29/2014 FINAL  Final  Culture, blood (routine x 2)     Status: None   Collection Time: 10/24/14 10:31 AM  Result Value Ref Range Status   Specimen Description BLOOD  Final   Special Requests NONE  Final   Culture NO GROWTH 5 DAYS  Final   Report Status 10/29/2014 FINAL  Final  Culture, sputum-assessment     Status: None   Collection Time: 10/25/14  6:25 AM  Result Value Ref Range Status   Specimen Description SPUTUM  Final   Special Requests NONE  Final   Sputum evaluation THIS SPECIMEN IS ACCEPTABLE FOR SPUTUM CULTURE  Final   Report Status 10/25/2014 FINAL  Final  Culture, respiratory (NON-Expectorated)     Status: None   Collection Time: 10/25/14  6:25 AM  Result Value Ref Range Status   Specimen Description SPUTUM  Final   Special Requests NONE Reflexed from [id number]  Final   Gram Stain   Final    MANY WBC SEEN FEW GRAM POSITIVE COCCI IN CLUSTERS IN  PAIRS EXCELLENT SPECIMEN - 90-100% WBCS    Culture MODERATE GROWTH STREPTOCOCCUS PNEUMONIAE  Final   Report Status 10/28/2014 FINAL  Final   Organism ID, Bacteria STREPTOCOCCUS PNEUMONIAE  Final      Susceptibility   Streptococcus pneumoniae - MIC (ETEST)*    PENICILLIN Value in next row Sensitive      SENSITIVE0.50    CEFTRIAXONE Value in next row Sensitive      SENSITIVE0.125    LEVOFLOXACIN Value in next row Sensitive      SENSITIVE1.0    ERYTHROMYCIN Value in next row Resistant      RESISTANT8.0    VANCOMYCIN Value in next row Sensitive      SENSITIVE0.50    OXACILLIN Value in next row  Resistant      SENSITIVE0.50    * MODERATE GROWTH STREPTOCOCCUS PNEUMONIAE    RADIOLOGY:  No results found.  EKG:   Orders placed or performed during the hospital encounter of 10/24/14  . ED EKG  . ED EKG  . EKG 12-Lead  . EKG 12-Lead  . EKG      Management plans discussed with the patient, family and they are in agreement.  CODE STATUS:   TOTAL TIME TAKING CARE OF THIS PATIENT: 45 minutes.    @[facility] @  on 11/04/2014 at 10:42 PM  Between 7am to 6pm - Pager - [phone]  After 6pm go to [email or web address] - password [email or web address] [facility]  [facility]  Office  [phone]  CC: Primary care physician; No primary care provider on file.

## 2015-01-16 ENCOUNTER — Emergency Department
Admission: EM | Admit: 2015-01-16 | Discharge: 2015-01-17 | Disposition: A | Discharge status: Home / Self Care | Payer: No Typology Code available for payment source | Attending: Emergency Medicine | Admitting: Emergency Medicine

## 2015-01-16 DIAGNOSIS — Y92410 Unspecified street and highway as the place of occurrence of the external cause: Secondary | ICD-10-CM | POA: Diagnosis not present

## 2015-01-16 DIAGNOSIS — I10 Essential (primary) hypertension: Secondary | ICD-10-CM | POA: Diagnosis not present

## 2015-01-16 DIAGNOSIS — M79603 Pain in arm, unspecified: Secondary | ICD-10-CM

## 2015-01-16 DIAGNOSIS — Z7982 Long term (current) use of aspirin: Secondary | ICD-10-CM | POA: Insufficient documentation

## 2015-01-16 DIAGNOSIS — Y998 Other external cause status: Secondary | ICD-10-CM | POA: Insufficient documentation

## 2015-01-16 DIAGNOSIS — S4991XA Unspecified injury of right shoulder and upper arm, initial encounter: Secondary | ICD-10-CM | POA: Diagnosis present

## 2015-01-16 DIAGNOSIS — S0990XA Unspecified injury of head, initial encounter: Secondary | ICD-10-CM | POA: Diagnosis not present

## 2015-01-16 DIAGNOSIS — Z720 Tobacco use: Secondary | ICD-10-CM | POA: Insufficient documentation

## 2015-01-16 DIAGNOSIS — S59901A Unspecified injury of right elbow, initial encounter: Secondary | ICD-10-CM | POA: Insufficient documentation

## 2015-01-16 DIAGNOSIS — Y9389 Activity, other specified: Secondary | ICD-10-CM | POA: Insufficient documentation

## 2015-01-16 DIAGNOSIS — Z7952 Long term (current) use of systemic steroids: Secondary | ICD-10-CM | POA: Insufficient documentation

## 2015-01-16 DIAGNOSIS — M7918 Myalgia, other site: Secondary | ICD-10-CM

## 2015-01-16 MED ORDER — IPRATROPIUM-ALBUTEROL 0.5-2.5 (3) MG/3ML IN SOLN
3.0000 mL | Freq: Once | RESPIRATORY_TRACT | Status: AC
  Administered 2015-01-16: 3 mL via RESPIRATORY_TRACT

## 2015-01-16 MED ORDER — IPRATROPIUM-ALBUTEROL 0.5-2.5 (3) MG/3ML IN SOLN
RESPIRATORY_TRACT | Status: AC
  Administered 2015-01-16: 3 mL via RESPIRATORY_TRACT
  Filled 2015-01-16: qty 3
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
## 2015-01-17 ENCOUNTER — Emergency Department: Payer: No Typology Code available for payment source

## 2015-01-17 ENCOUNTER — Other Ambulatory Visit: Payer: Self-pay

## 2015-01-17 MED ORDER — IBUPROFEN 800 MG PO TABS: 800.0000 mg | ORAL_TABLET | Freq: Three times a day (TID) | ORAL | Status: DC | PRN

## 2015-01-17 MED ORDER — ACETAMINOPHEN 325 MG PO TABS
650.0000 mg | ORAL_TABLET | Freq: Once | ORAL | Status: AC
  Administered 2015-01-17: 650 mg via ORAL
  Filled 2015-01-17: qty 2
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
## 2015-02-11 IMAGING — CR DG CHEST 1V PORT
1 series · 1 of 1 positions shown · non-contrast
Comparison: 07/21/2013

CLINICAL DATA: Shortness of breath.

EXAM:
PORTABLE CHEST - 1 VIEW

[ap]
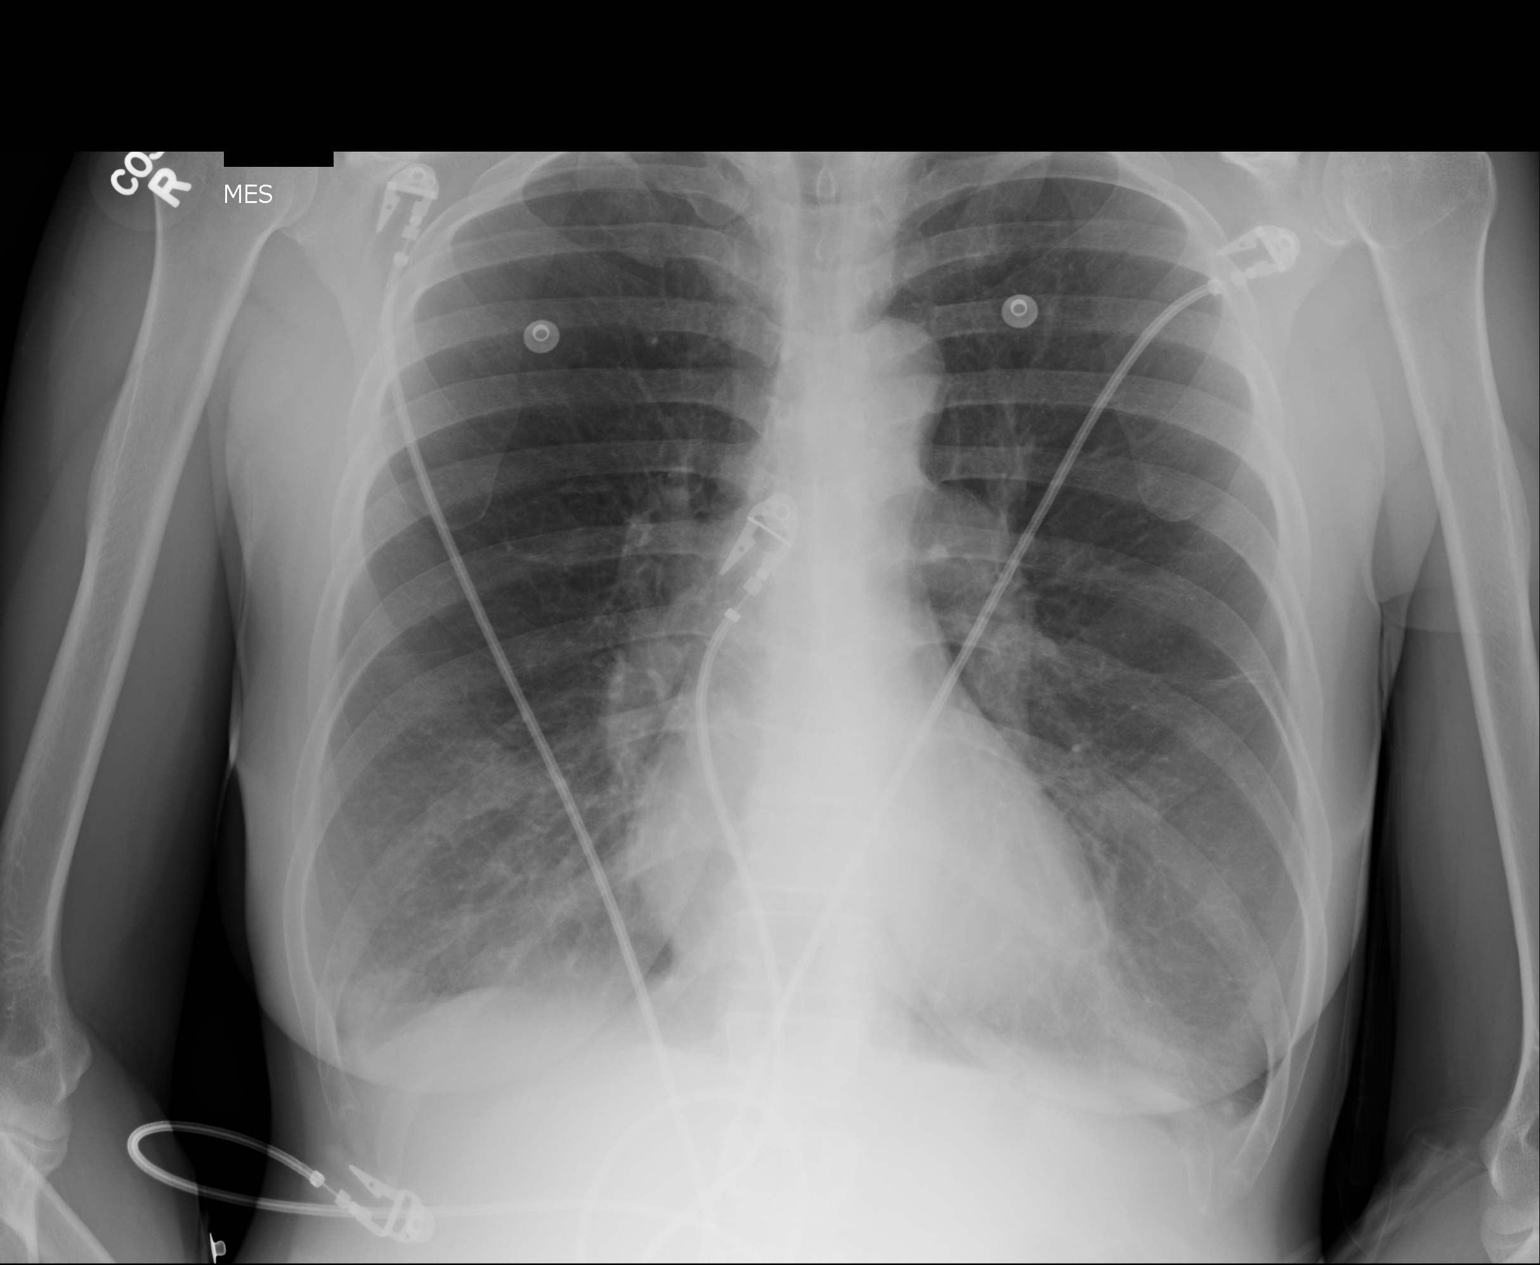

[1 of 1 positions shown; findings below may reference images not displayed]

FINDINGS: Underlying COPD and hyperinflation again noted. There is some
component of chronic bibasilar scarring/ atelectasis. This is
somewhat more prominent, however, at the right lung base and a
component of acute infiltrate may be present. No edema, pleural
fluid or pneumothorax is identified. The heart size and mediastinal
contours are normal.
IMPRESSION: Underlying COPD with possible acute infiltrate at the right lung
base.

## 2015-02-11 IMAGING — CR DG CHEST 1V PORT
1 series · 1 of 1 positions shown · non-contrast
Comparison: 10/29/2013

CLINICAL DATA: Intubation.  Central line placement.

EXAM:
PORTABLE CHEST - 1 VIEW

[ap]
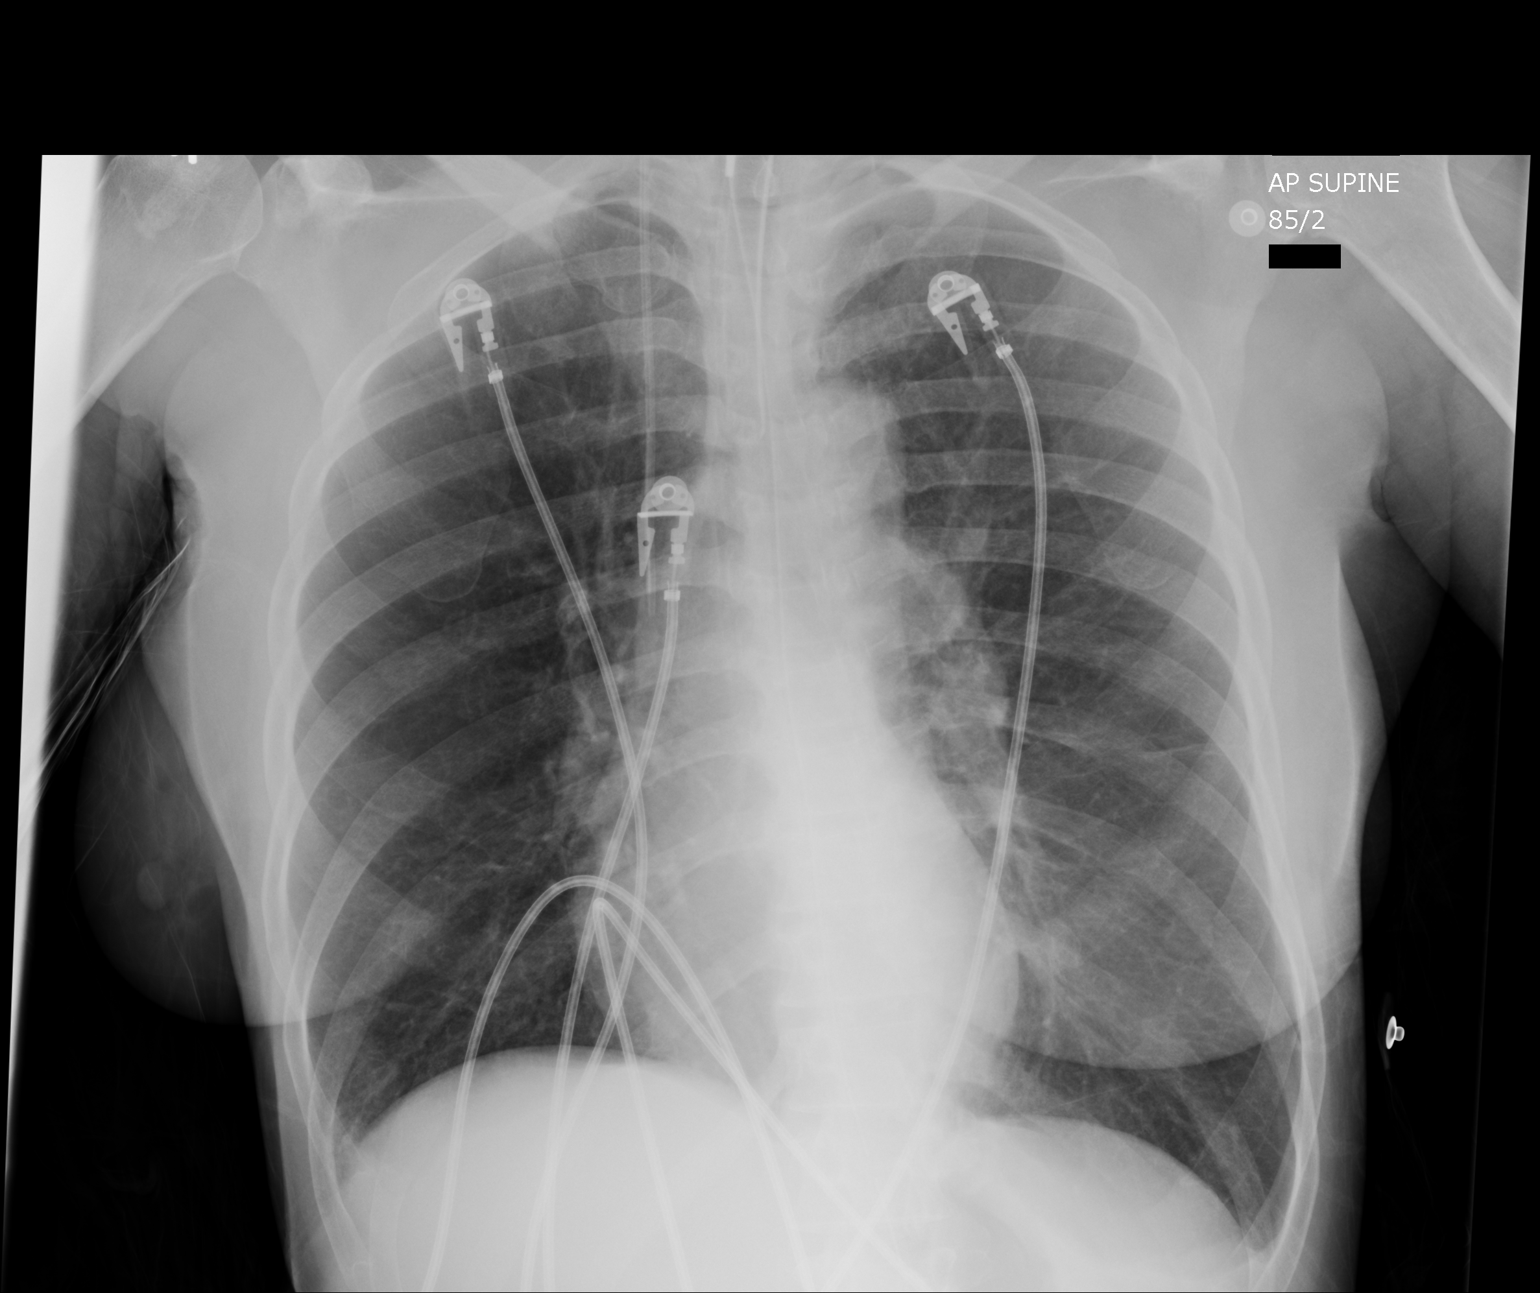

[1 of 1 positions shown; findings below may reference images not displayed]

FINDINGS: Endotracheal tube is 3.7 cm above the carina. NG tube enters the
stomach. Right central line tip in the SVC. No pneumothorax. Heart
is normal size. Right lung is clear. Left basilar opacity medially
could represent atelectasis or infiltrate. No effusions. No acute
bony abnormality.
IMPRESSION: Support devices in expected position as above.  No pneumothorax.

Medial left basilar atelectasis or infiltrate.

## 2015-02-12 IMAGING — CR DG CHEST 1V PORT
1 series · 1 of 1 positions shown · non-contrast
Comparison: October 29, 2013

CLINICAL DATA: Hypoxia

EXAM:
PORTABLE CHEST - 1 VIEW

[ap]
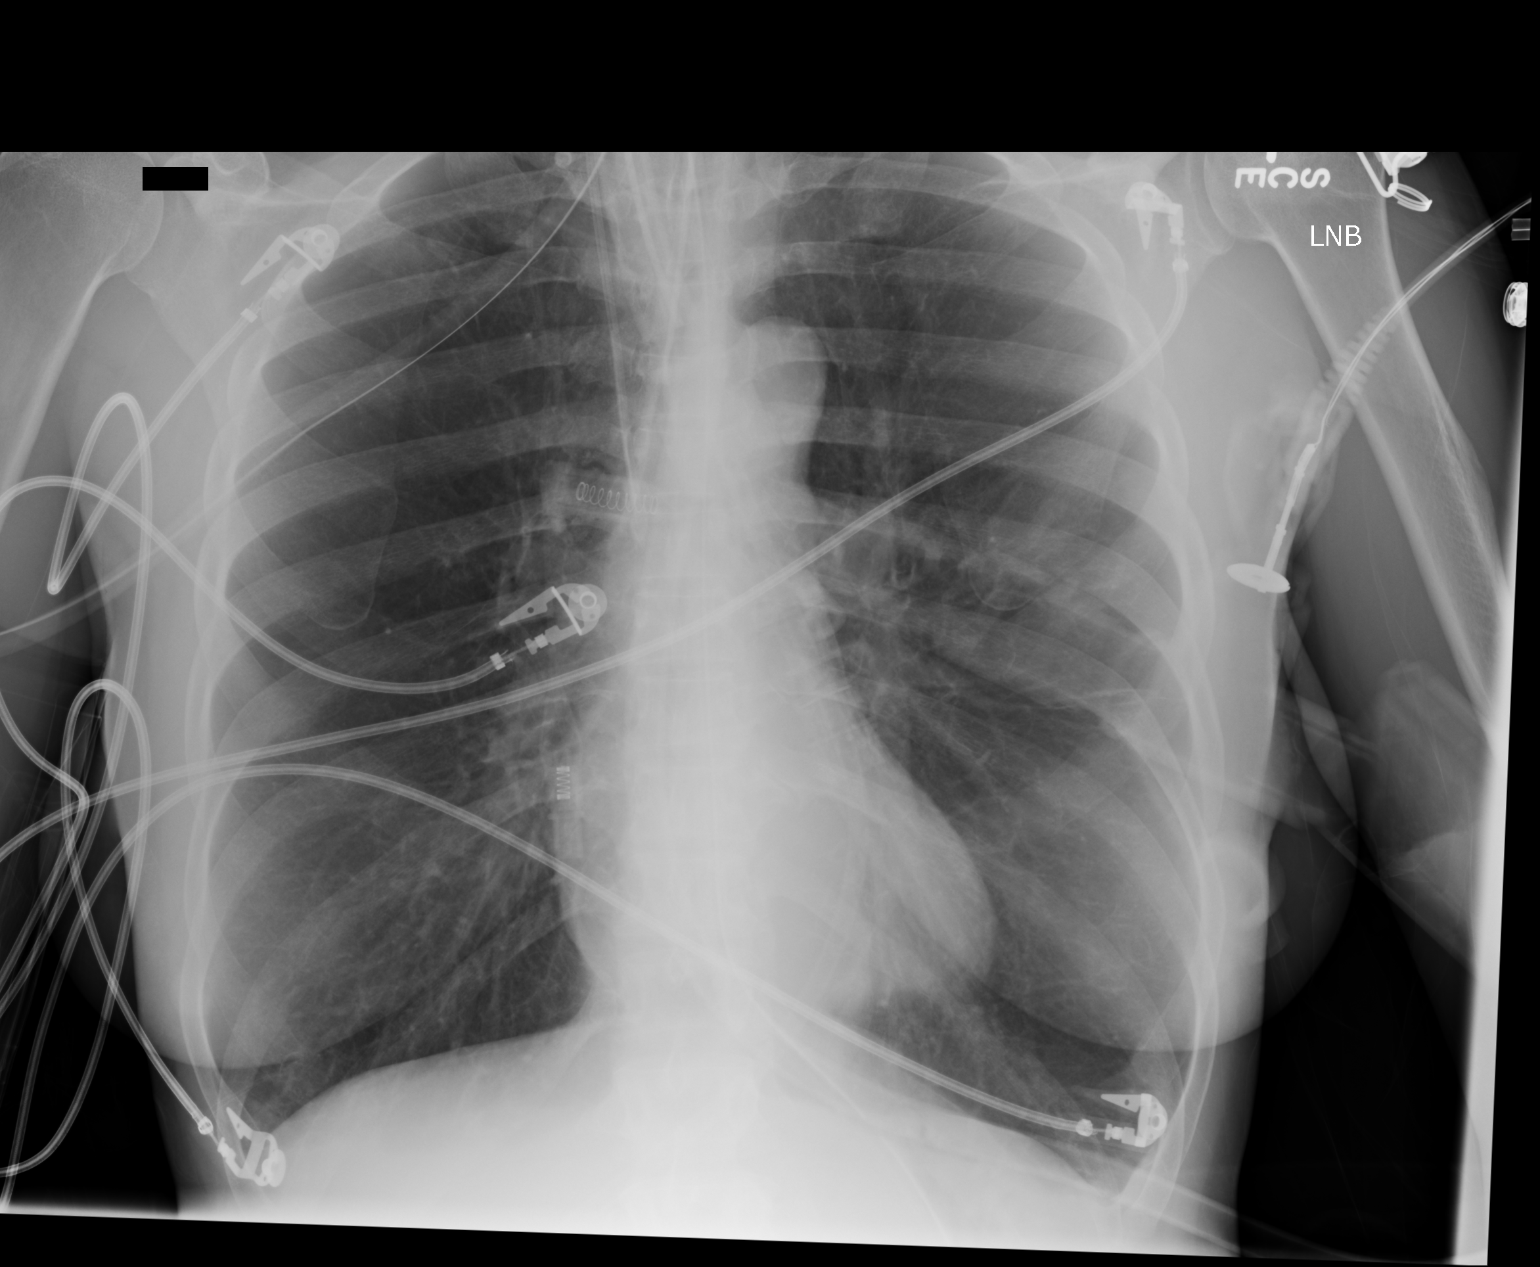

[1 of 1 positions shown; findings below may reference images not displayed]

FINDINGS: Endotracheal tube tip is 3.5 cm above the carina. Central catheter
tip is in the superior vena cava. Nasogastric tube tip and side port
are in the stomach. No pneumothorax.

There is underlying emphysematous change. There is mild scarring in
the left mid lung. There is no edema or consolidation. The heart
size and pulmonary vascularity are normal. No adenopathy.
IMPRESSION: Tube and catheter positions as described without pneumothorax.
Underlying emphysematous change. Mild scarring left mid lung. No
edema or consolidation

## 2015-02-15 IMAGING — CR DG CHEST 1V PORT
1 series · 1 of 1 positions shown · non-contrast
Comparison: October 30, 2013

CLINICAL DATA: Respiratory failure

EXAM:
PORTABLE CHEST - 1 VIEW

[ap]
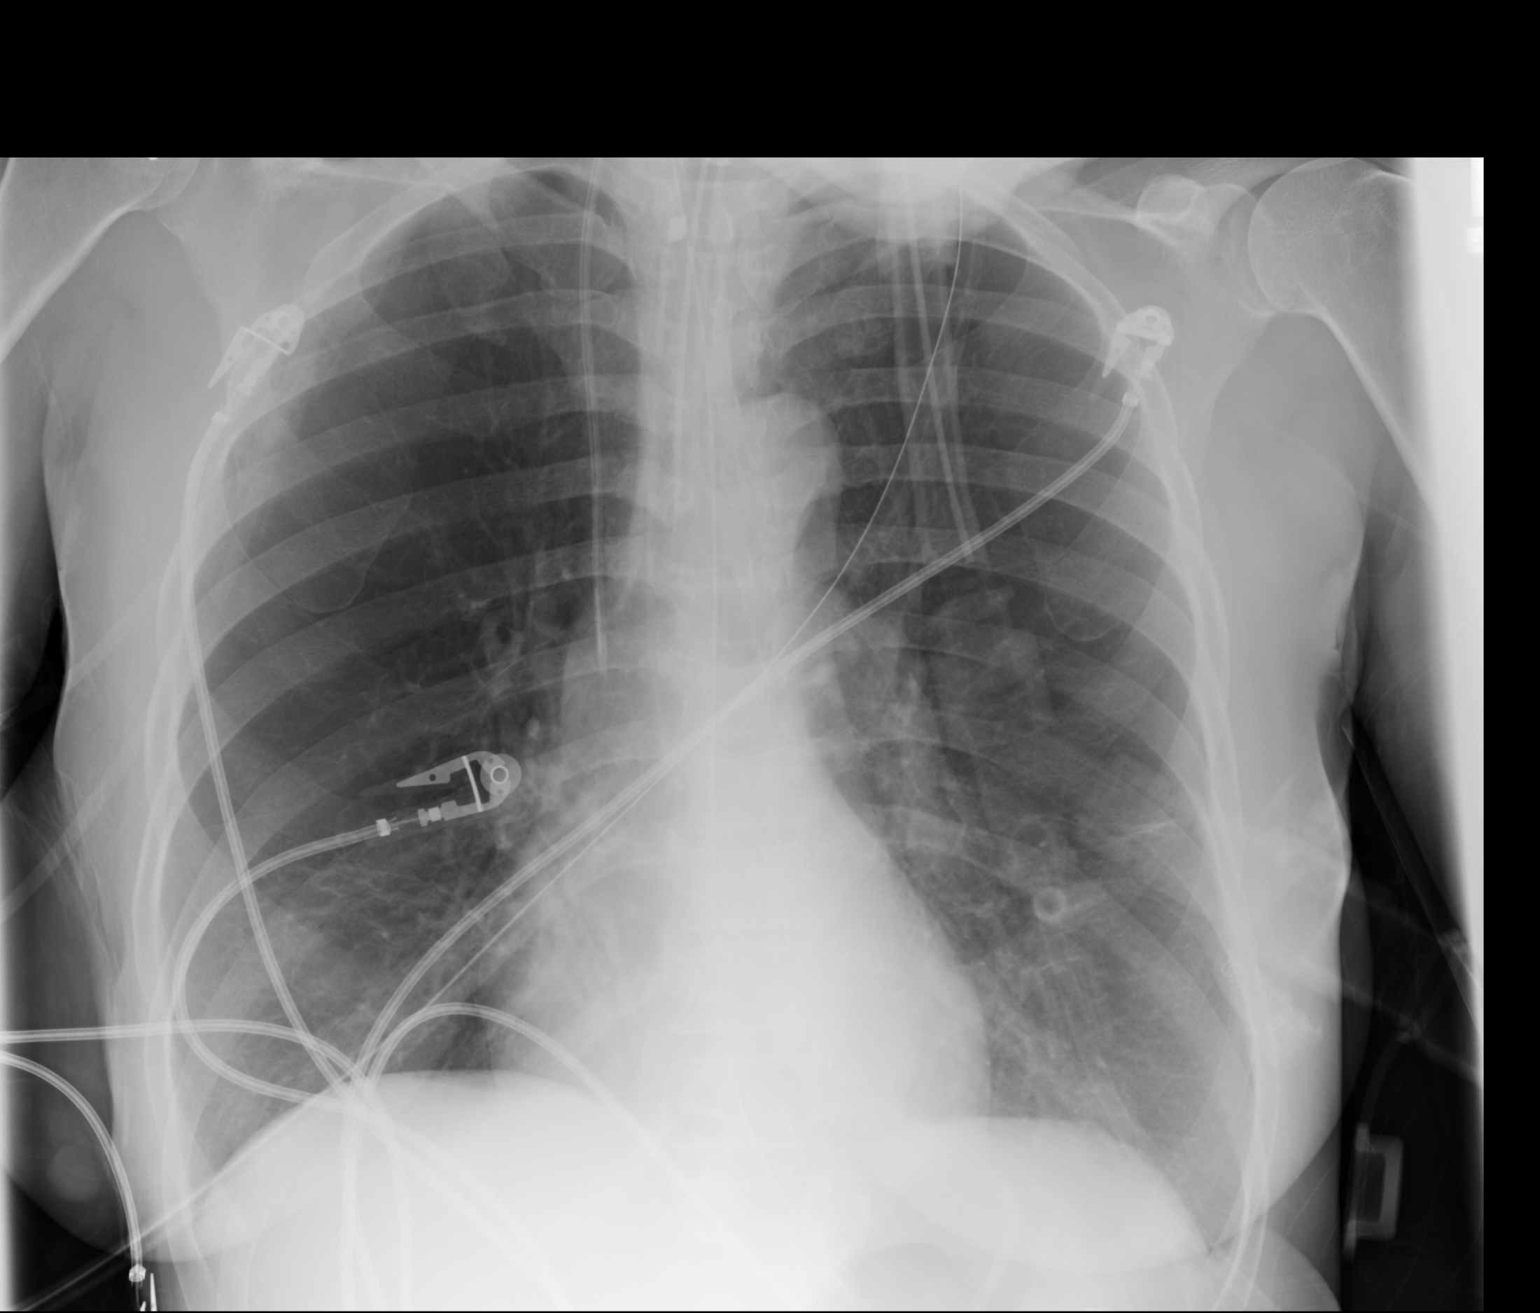

[1 of 1 positions shown; findings below may reference images not displayed]

FINDINGS: Endotracheal tube tip is 2.5 cm above the carina. Nasogastric tube
tip and side port are below the diaphragm. Central catheter tip is
in the superior vena cava. No pneumothorax.

There is underlying emphysematous change. There is mild left mid
lung scarring. There is no edema or consolidation. The heart size is
normal. Pulmonary vascularity reflects underlying emphysema. No
adenopathy.
IMPRESSION: Tube and catheter positions as described without pneumothorax.
Underlying emphysematous change. No edema or consolidation.

## 2015-02-17 IMAGING — NM NM LUNG SCAN
2 series · 16 of 16 positions shown · non-contrast
Comparison: None.

CLINICAL DATA: Hypoxia, shortness of Breath. Companion chest
radiograph shows hyperinflation without focal abnormality.

EXAM:
NUCLEAR MEDICINE VENTILATION - PERFUSION LUNG SCAN
TECHNIQUE: Ventilation images were obtained in multiple projections using
inhaled aerosol technetium 99 M DTPA. Perfusion images were obtained
in multiple projections after intravenous injection of Bc-88m MAA.
RADIOPHARMACEUTICALS:  40.624 mCi Bc-88m DTPA aerosol and 3.975 mCi
Bc-88m MAA

[Series 1000: lung perfusion · 1.95mm/px · 4 acquisitions, 8 frames shown]
[im 1/4]
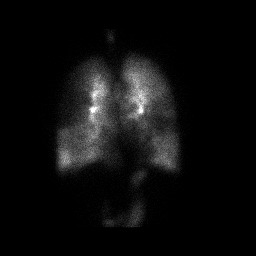
[im 1/4]
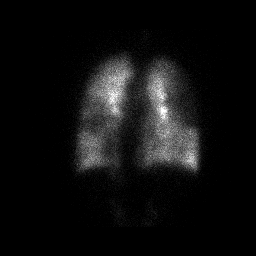
[im 2/4]
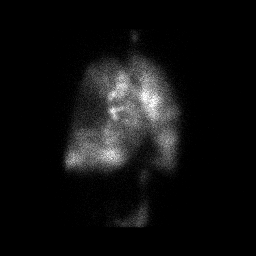
[im 2/4]
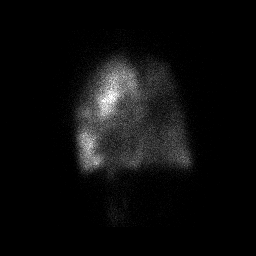
[im 3/4]
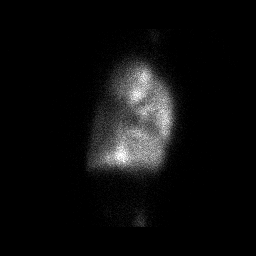
[im 3/4]
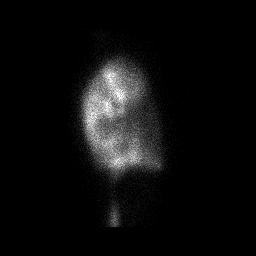
[im 4/4]
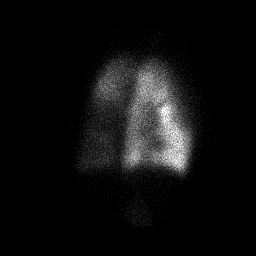
[im 4/4]
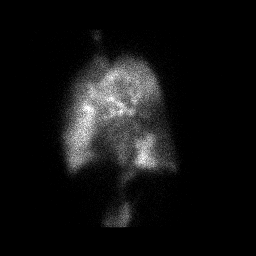

[Series 1000: lung ventilation · 3.90mm/px · 4 acquisitions, 8 frames shown]
[im 1/4]
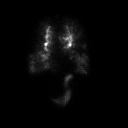
[im 1/4]
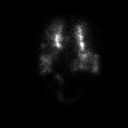
[im 2/4]
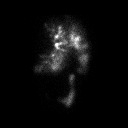
[im 2/4]
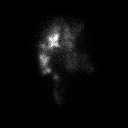
[im 3/4]
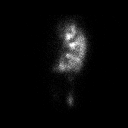
[im 3/4]
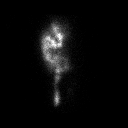
[im 4/4]
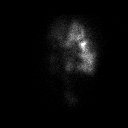
[im 4/4]
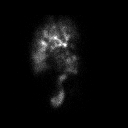

[16 of 16 positions shown; findings below may reference images not displayed]

FINDINGS: Ventilation: Markedly heterogeneous distribution 0 with a moderate
amount of central deposition. Large areas of significantly decreased
activity involving much of the posterior right upper lobe, superior
segment right lower lobe, superior segment left lower lobe, and
posterior left lower lobe.

Perfusion: Multiple segmental and moderate subsegmental perfusion
defects bilaterally, largely corresponding to ventilation
abnormalities. No definite unmatched perfusion abnormality.
IMPRESSION: Extensive matched ventilation and perfusion abnormalities
bilaterally, intermediate likelihood ratio for pulmonary embolism.

## 2015-02-17 IMAGING — CT CT CHEST W/O CM
2 of 3 series · 15 of 36 positions shown, 18 images · non-contrast
Comparison: None.

CLINICAL DATA: Pt. here w/ acute resp. failure due to COPD
Exacerbation. Was not improving on Bipap and therefore was
intubated. Extubated , doing well, still c/o pain in throat,
difficulty swallowing, breathing is satisfactory, remaisn on some O2

EXAM:
CT CHEST WITHOUT CONTRAST
TECHNIQUE: Multidetector CT imaging of the chest was performed following the
standard protocol without IV contrast..

[Series 2: routine chest wo · axial · 0.60mm/px · z∈[-200,+74]mm · 12 of 65 slices shown, 15 images]
[im 5/65  mediastinal]
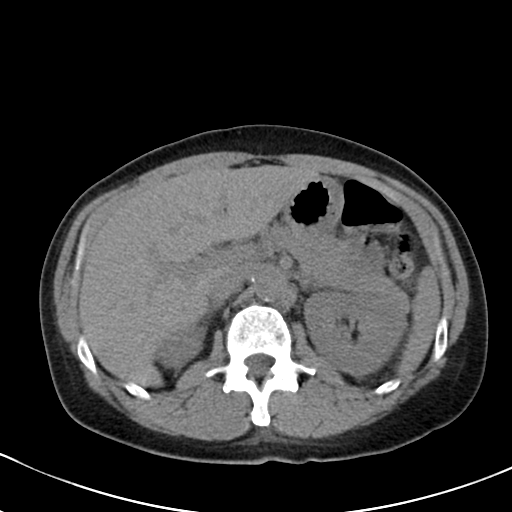
[im 5/65  lung]
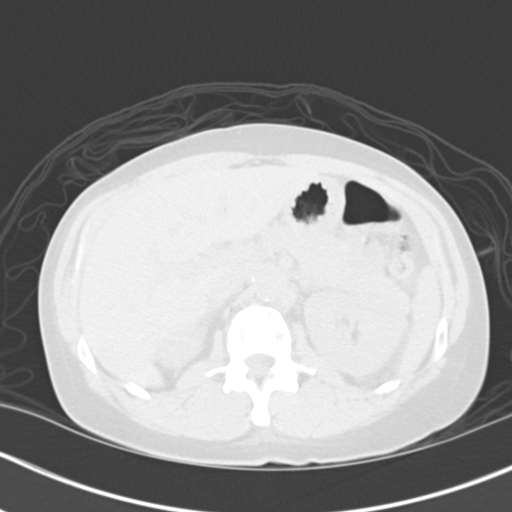
[im 10/65  lung]
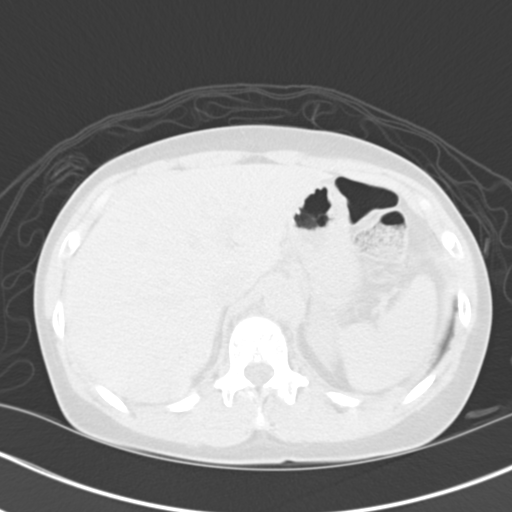
[im 15/65  lung]
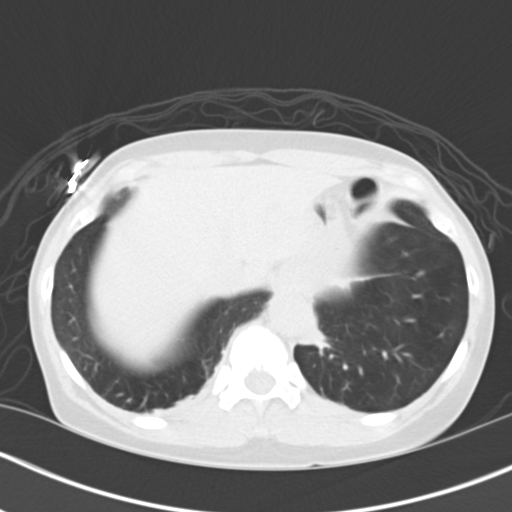
[im 19/65  lung]
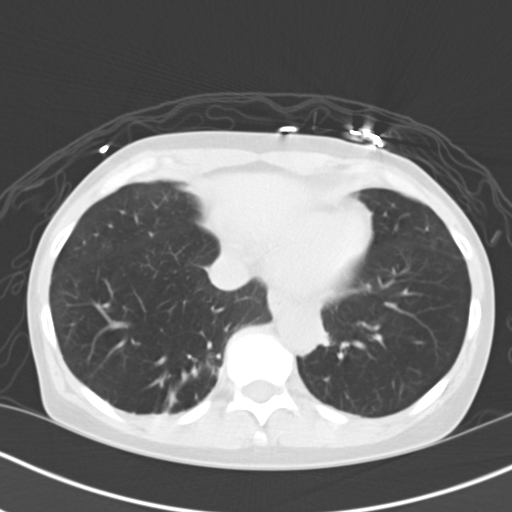
[im 24/65  mediastinal]
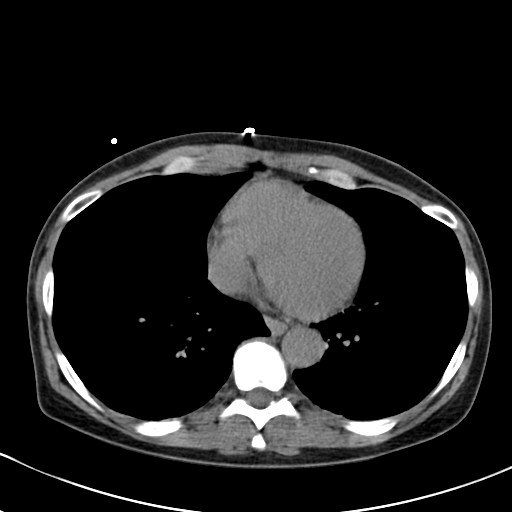
[im 24/65  lung]
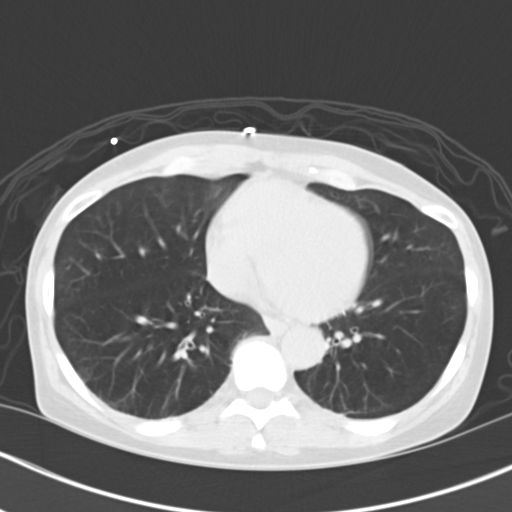
[im 29/65  lung]
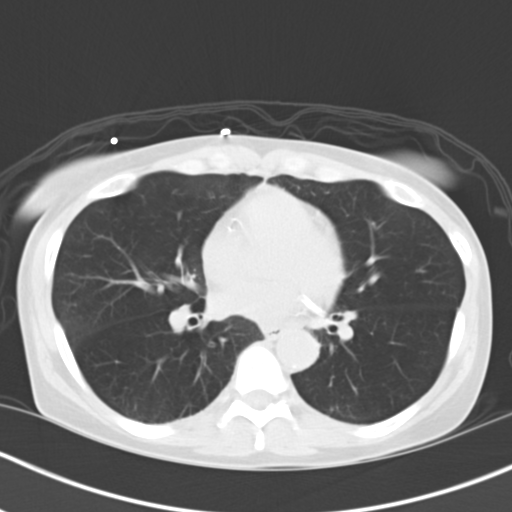
[im 36/65  lung]
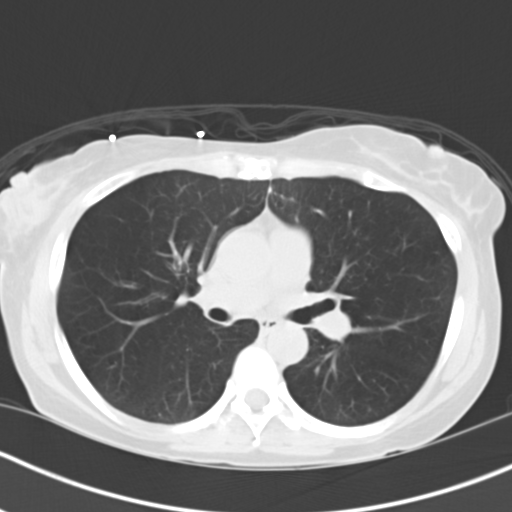
[im 41/65  lung]
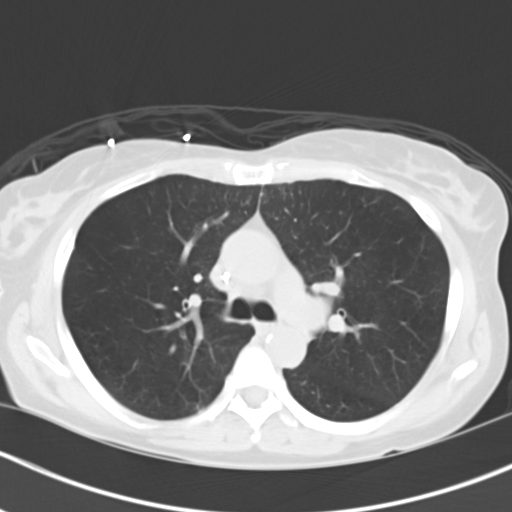
[im 46/65  mediastinal]
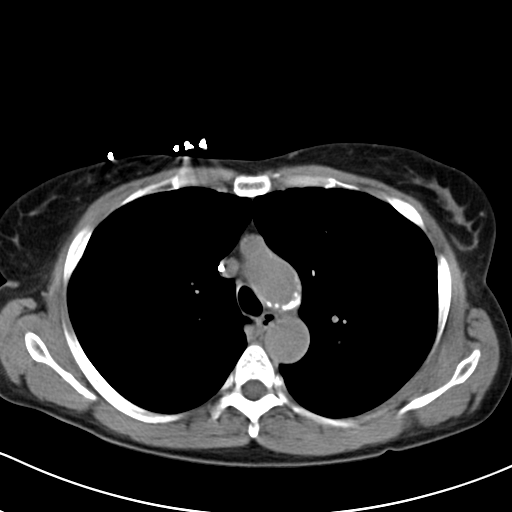
[im 46/65  lung]
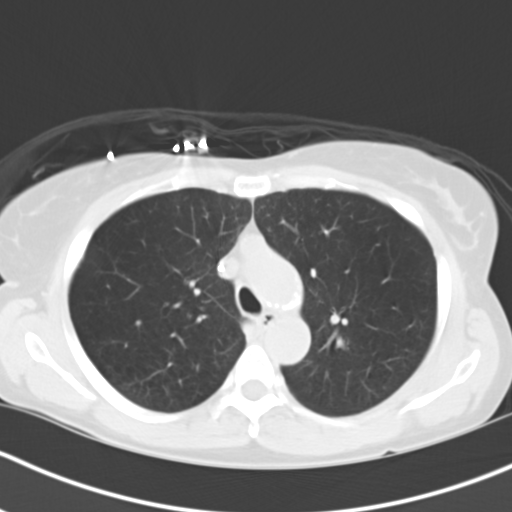
[im 50/65  lung]
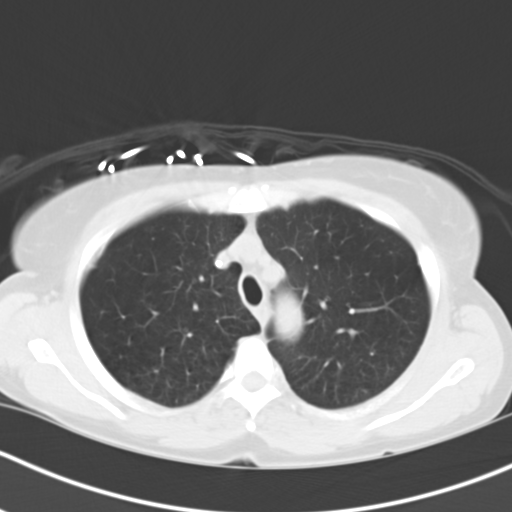
[im 55/65  lung]
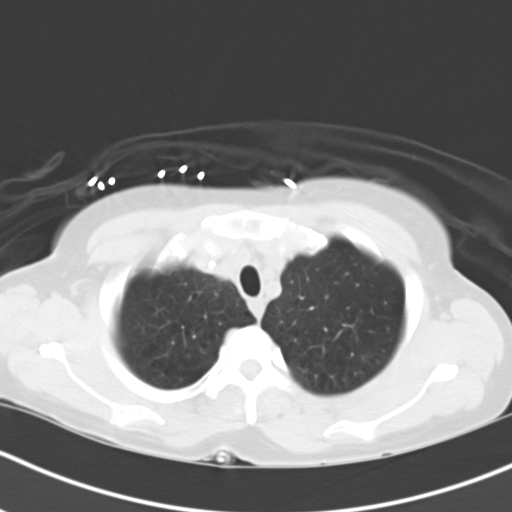
[im 60/65  lung]
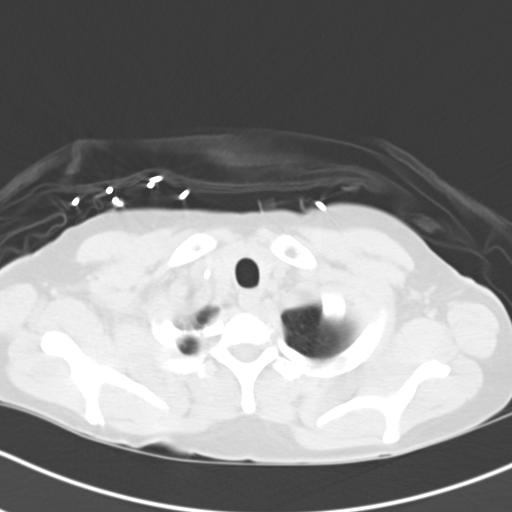

[Series 5: cor routine chest wo · coronal · 0.62mm/px · 3 of 109 slices shown]
[im 22/109  lung]
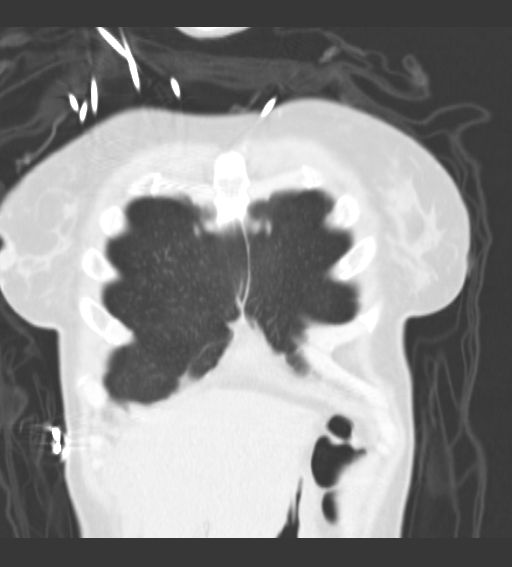
[im 44/109  lung]
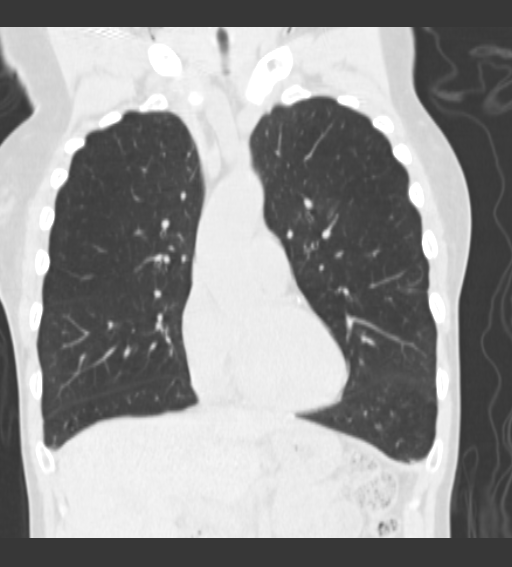
[im 65/109  lung]
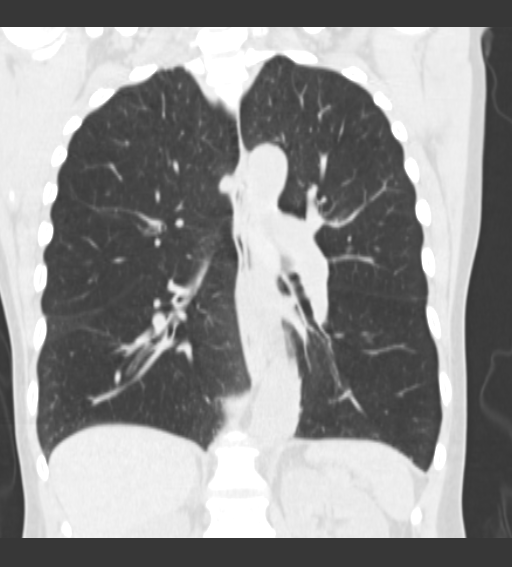

[15 of 36 positions shown; findings below may reference images not displayed]

FINDINGS: Patchy coronary and aortic calcifications. Coronary stent. No
pleural or pericardial effusion. No definite hilar or mediastinal
adenopathy, sensitivity decreased without IV contrast. Right central
line extends to the mid SVC. No pneumothorax. Emphysematous changes
most marked in the apices. There is some subsegmental atelectasis/
consolidation in the medial basal segment left lower lobe. Linear
scarring in the superior segment right lower lobe. Lungs otherwise
clear.
IMPRESSION: 1. Minimal subsegmental atelectasis/consolidation in the medial
basal segment left lower lobe.
2. Atherosclerosis, including aortic and coronary artery disease.
Please note that although the presence of coronary artery calcium
documents the presence of coronary artery disease, the severity of
this disease and any potential stenosis cannot be assessed on this
non-gated CT examination. Assessment for potential risk factor
modification, dietary therapy or pharmacologic therapy may be
warranted, if clinically indicated.

## 2015-02-17 IMAGING — CR DG CHEST 1V
1 series · 1 of 1 positions shown · non-contrast
Comparison: CT scan of the chest of today's date. Portable chest
x-ray dated November 02, 2013

CLINICAL DATA: Pre ventilation perfusion scan chest film

EXAM:
CHEST - 1 VIEW

[w chest pa]
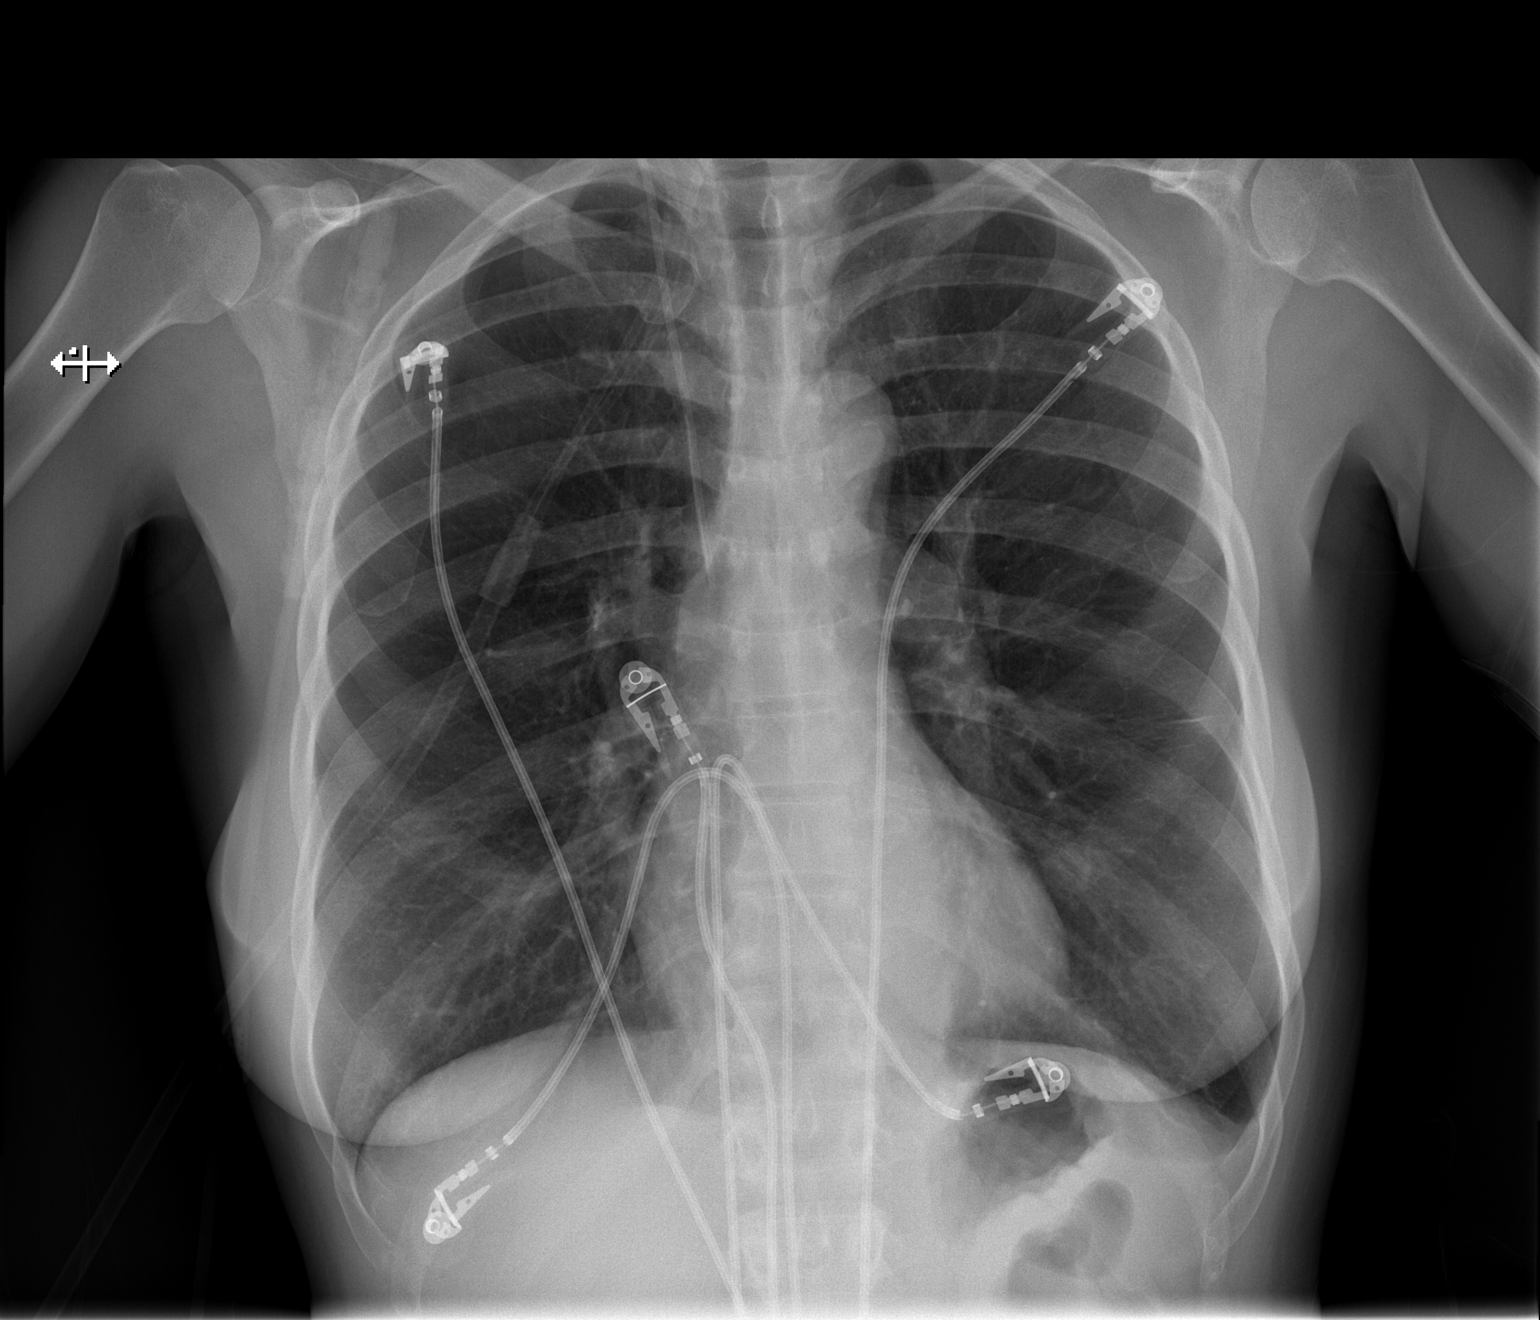

[1 of 1 positions shown; findings below may reference images not displayed]

FINDINGS: The lungs are hyperinflated. There is minimal blunting of the left
lateral costophrenic angle. Subsegmental atelectasis peripherally in
the left mid lung is stable. The cardiac silhouette and pulmonary
vascularity are within the limits of normal. The esophagogastric
tube and the endotracheal tube have been removed. The right internal
jugular venous catheter tip lies in the region of the proximal to
mid portion of the SVC.
IMPRESSION: There is hyperinflation consistent with COPD. There is no evidence
of pneumonia nor CHF.

## 2015-03-07 ENCOUNTER — Ambulatory Visit: Payer: Self-pay | Attending: Oncology | Admitting: *Deleted

## 2015-03-07 ENCOUNTER — Encounter: Payer: Self-pay | Admitting: *Deleted

## 2015-03-07 VITALS — BP 158/95 | HR 74 | Temp 95.0°F | Ht 61.0 in | Wt 76.7 lb

## 2015-03-07 DIAGNOSIS — N63 Unspecified lump in unspecified breast: Secondary | ICD-10-CM

## 2015-03-07 NOTE — Patient Instructions (Signed)
Gave patient hand-out, Women Staying Healthy, Active and Well from [facility], with education on breast health, pap smears, heart and colon health. 

## 2015-03-07 NOTE — Progress Notes (Signed)
Subjective:     Patient ID: Michelle [name], female   DOB: [birth year], 54 y.o.   MRN: [id number]  HPI   Review of Systems     Objective:   Physical Exam  Pulmonary/Chest: Right breast exhibits no inverted nipple, no mass, no nipple discharge, no skin change and no tenderness. Left breast exhibits no inverted nipple, no mass, no nipple discharge, no skin change and no tenderness. Breasts are symmetrical.       Assessment:     Extremely thin 54 year old 28 female presents to [facility] for clinical breast exam and mammogram only.  Patients daughters were with her today.  Patient is using accessory muscles to breathe.  Reeks of cigarette smoke.  States she smoked before she came in.  O2 sats were 95% using the oximetry.  States she has COPD.  Blood pressure elevated at 158/95.  She is to take her med's as soon as possible and recheck her blood pressure at [facility] or [facility], and if remains higher than 140/90 she is to follow-up with her primary care provider.  Hand out on hypertention given to patient.  Encouraged smoking cessation classes, and info given. Clinical breast exam unremarkable.  Last mammo was in [place] on 08/27/08.  Results were a birads 3.  Patient states she did not return for further imaging.  Taught self breast awareness.  Patient has been screened for eligibility.  She does not have any insurance, Medicare or Medicaid.  She also meets financial eligibility.  Hand-out given on the Affordable Care Act.    Plan:     Bilateral diagnostic mammogram and ultrasound ordered per protocol for last mammo.  Patient is to go by the breast center and complete a consent for release of information to get records from [facility].   They will schedule her mammogram when they are received.  Patient is to call me in 2 weeks if she has not been given an appointment.  She is agreeable.

## 2015-03-28 ENCOUNTER — Telehealth: Payer: Self-pay | Admitting: *Deleted

## 2015-03-28 NOTE — Telephone Encounter (Signed)
Called and left patient a message to return my call.  Spoke to [place] in the breast center last week and the patients films had been received, and they were going to schedule to patient's mammogram.  No appointment noted.

## 2015-03-30 ENCOUNTER — Inpatient Hospital Stay
Admission: RE | Admit: 2015-03-30 | Discharge: 2015-03-30 | Disposition: A | Discharge status: Home / Self Care | Payer: Self-pay | Source: Ambulatory Visit | Attending: *Deleted | Admitting: *Deleted

## 2015-03-30 ENCOUNTER — Other Ambulatory Visit: Payer: Self-pay | Admitting: *Deleted

## 2015-03-30 ENCOUNTER — Telehealth: Payer: Self-pay | Admitting: *Deleted

## 2015-03-30 DIAGNOSIS — Z9289 Personal history of other medical treatment: Secondary | ICD-10-CM

## 2015-04-07 DIAGNOSIS — F1410 Cocaine abuse, uncomplicated: Secondary | ICD-10-CM | POA: Insufficient documentation

## 2015-04-15 ENCOUNTER — Ambulatory Visit
Admission: RE | Admit: 2015-04-15 | Discharge: 2015-04-15 | Disposition: A | Discharge status: Home / Self Care | Payer: Self-pay | Source: Ambulatory Visit | Attending: Oncology | Admitting: Oncology

## 2015-04-15 DIAGNOSIS — N63 Unspecified lump in unspecified breast: Secondary | ICD-10-CM

## 2015-04-19 NOTE — Telephone Encounter (Signed)
Forgot to close encounter 

## 2015-04-20 ENCOUNTER — Ambulatory Visit: Payer: Self-pay

## 2015-04-20 ENCOUNTER — Encounter: Payer: Self-pay | Admitting: *Deleted

## 2015-04-20 NOTE — Progress Notes (Signed)
Letter mailed from the [facility] to inform patient of her normal mammogram results.  Patient is to follow-up with annual screening in one year.  [facility] to [place]. 

## 2015-10-20 IMAGING — CR DG CHEST 2V
1 series · 2 of 2 positions shown · non-contrast
Comparison: Prior chest x-ray and CT scan of the chest 11/04/2013

CLINICAL DATA: 54-year-old female with 1 day history of cough and
shortness of breath. Medical history includes COPD and asthma.

EXAM:
CHEST  2 VIEW

[Series 1: w chest lat · 0.14mm/px · 2 of 2 slices shown]
[im 1/2]
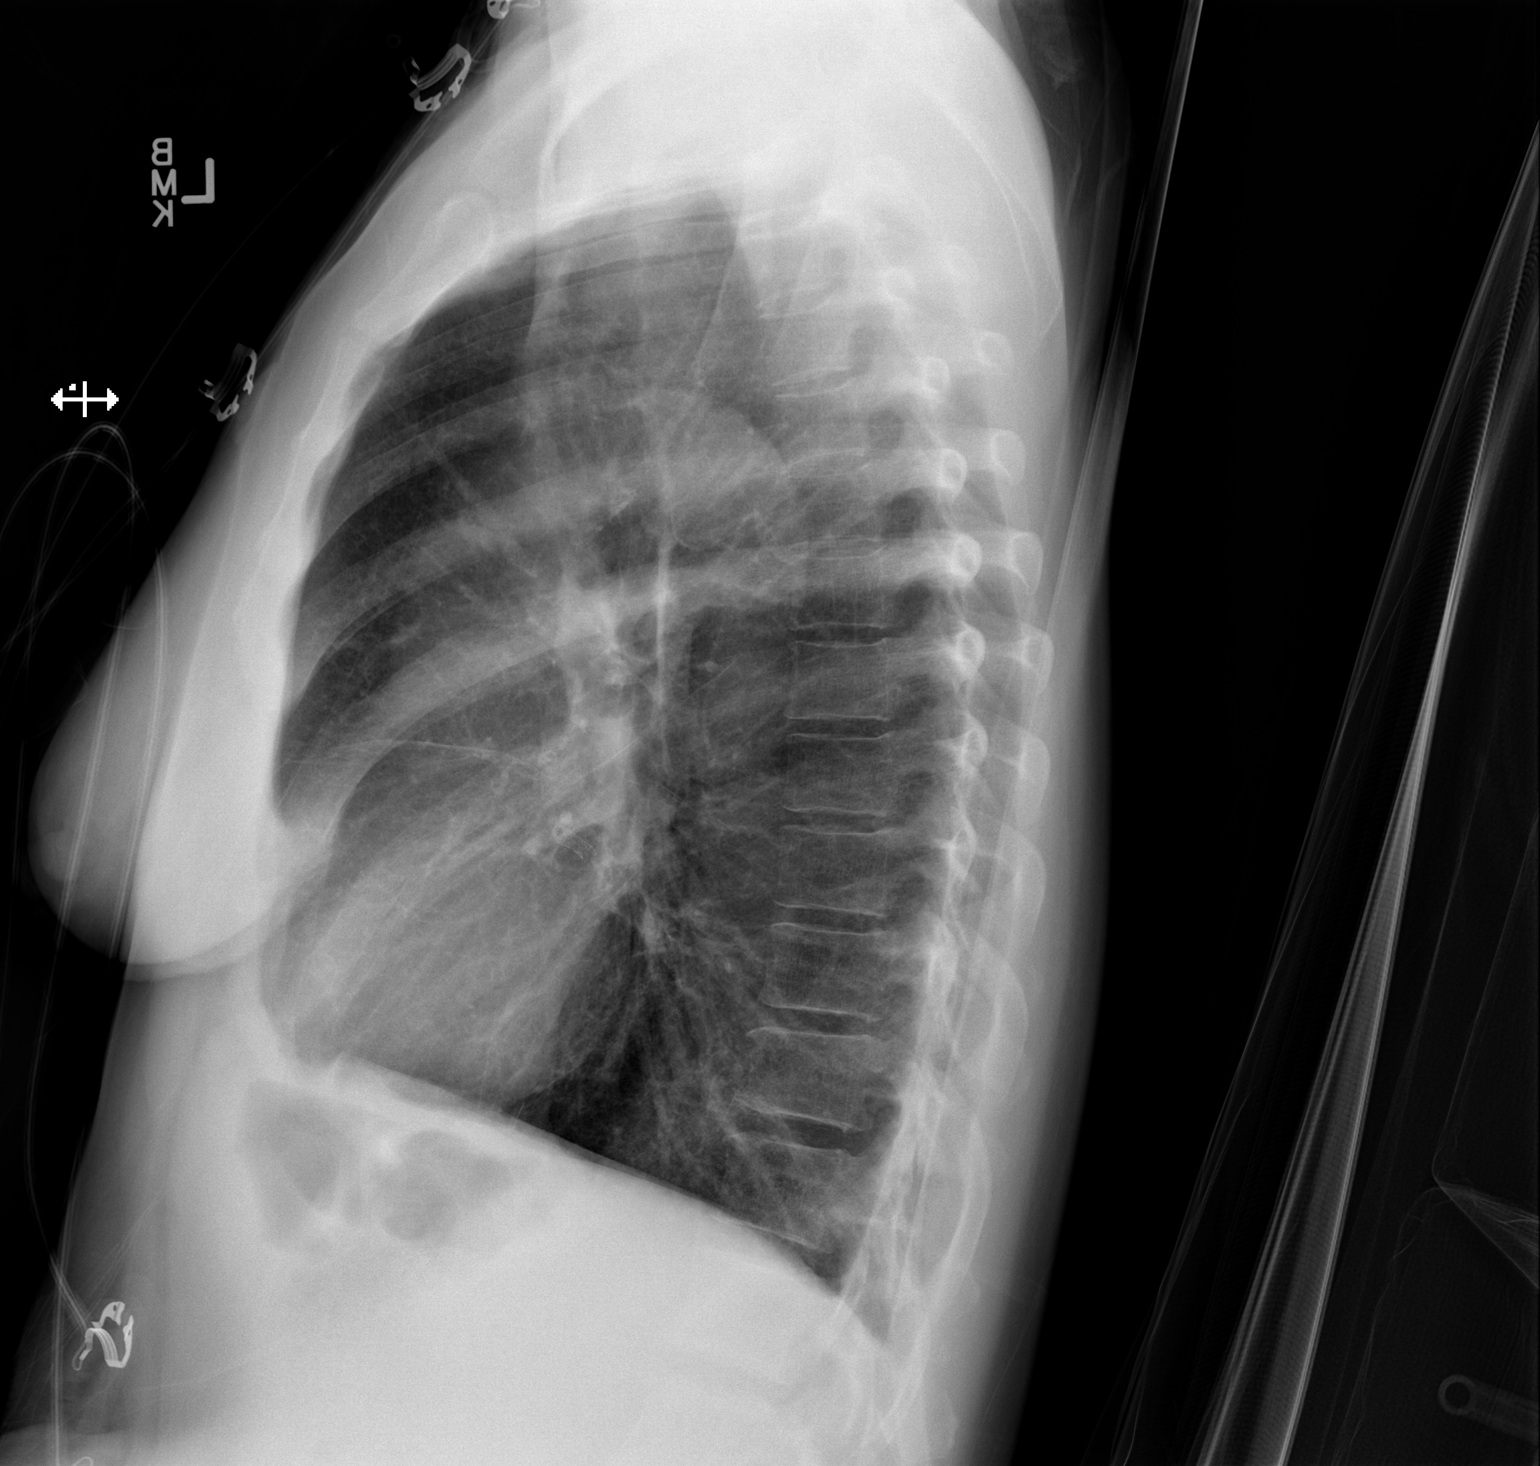
[im 2/2]
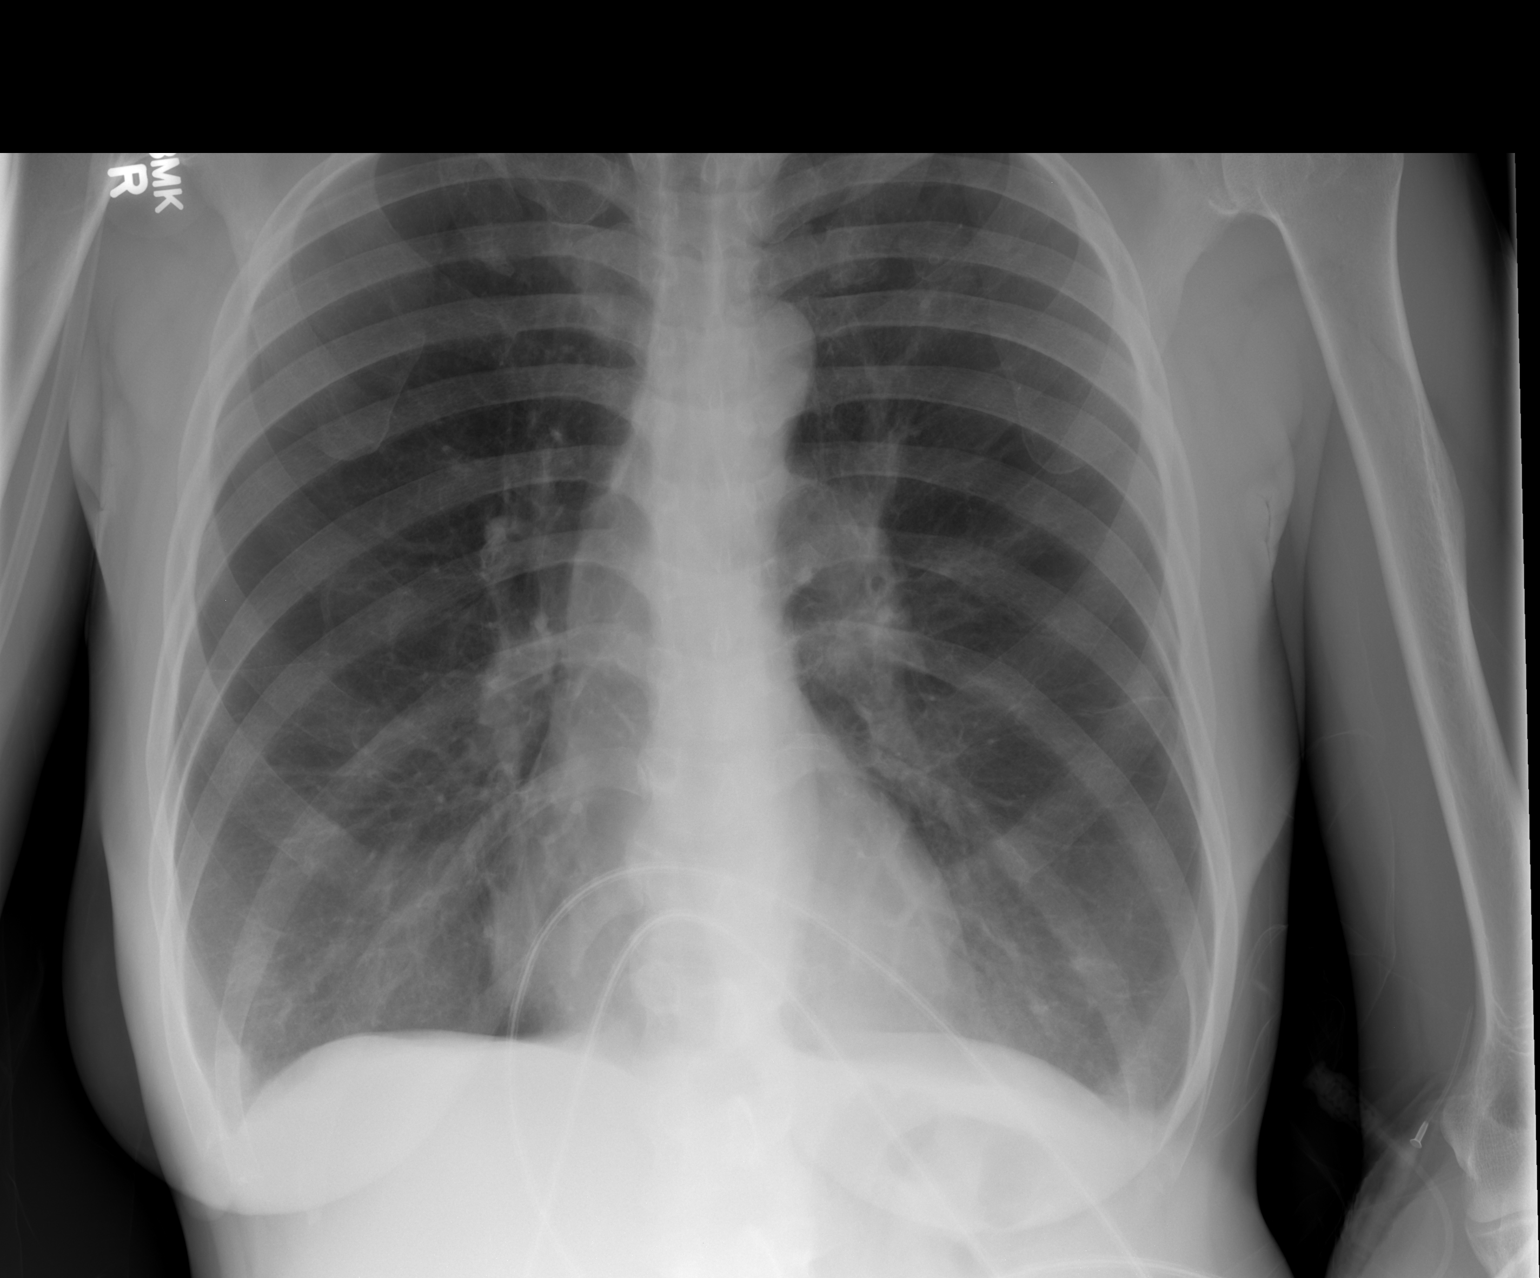

[2 of 2 positions shown; findings below may reference images not displayed]

FINDINGS: Pulmonary hyper expansion with central airway thickening and
peribronchial cuffing. No focal airspace consolidation, pulmonary
edema, pleural effusion or pneumothorax. Cardiac and mediastinal
contours are within normal limits. No acute osseous abnormality.
IMPRESSION: Hyperexpansion with central airway thickening/ peribronchial cuffing
consistent with the underlying clinical history of COPD and asthma.
Either COPD or asthma exacerbation is difficult to exclude
radiographically.

No focal airspace consolidation to suggest pneumonia.

## 2015-10-22 IMAGING — CR DG CHEST 1V PORT
1 series · 1 of 1 positions shown · non-contrast
Comparison: 07/07/2014

CLINICAL DATA: Shortness of breath for 1 week, history of emphysema

EXAM:
PORTABLE CHEST - 1 VIEW

[ap]
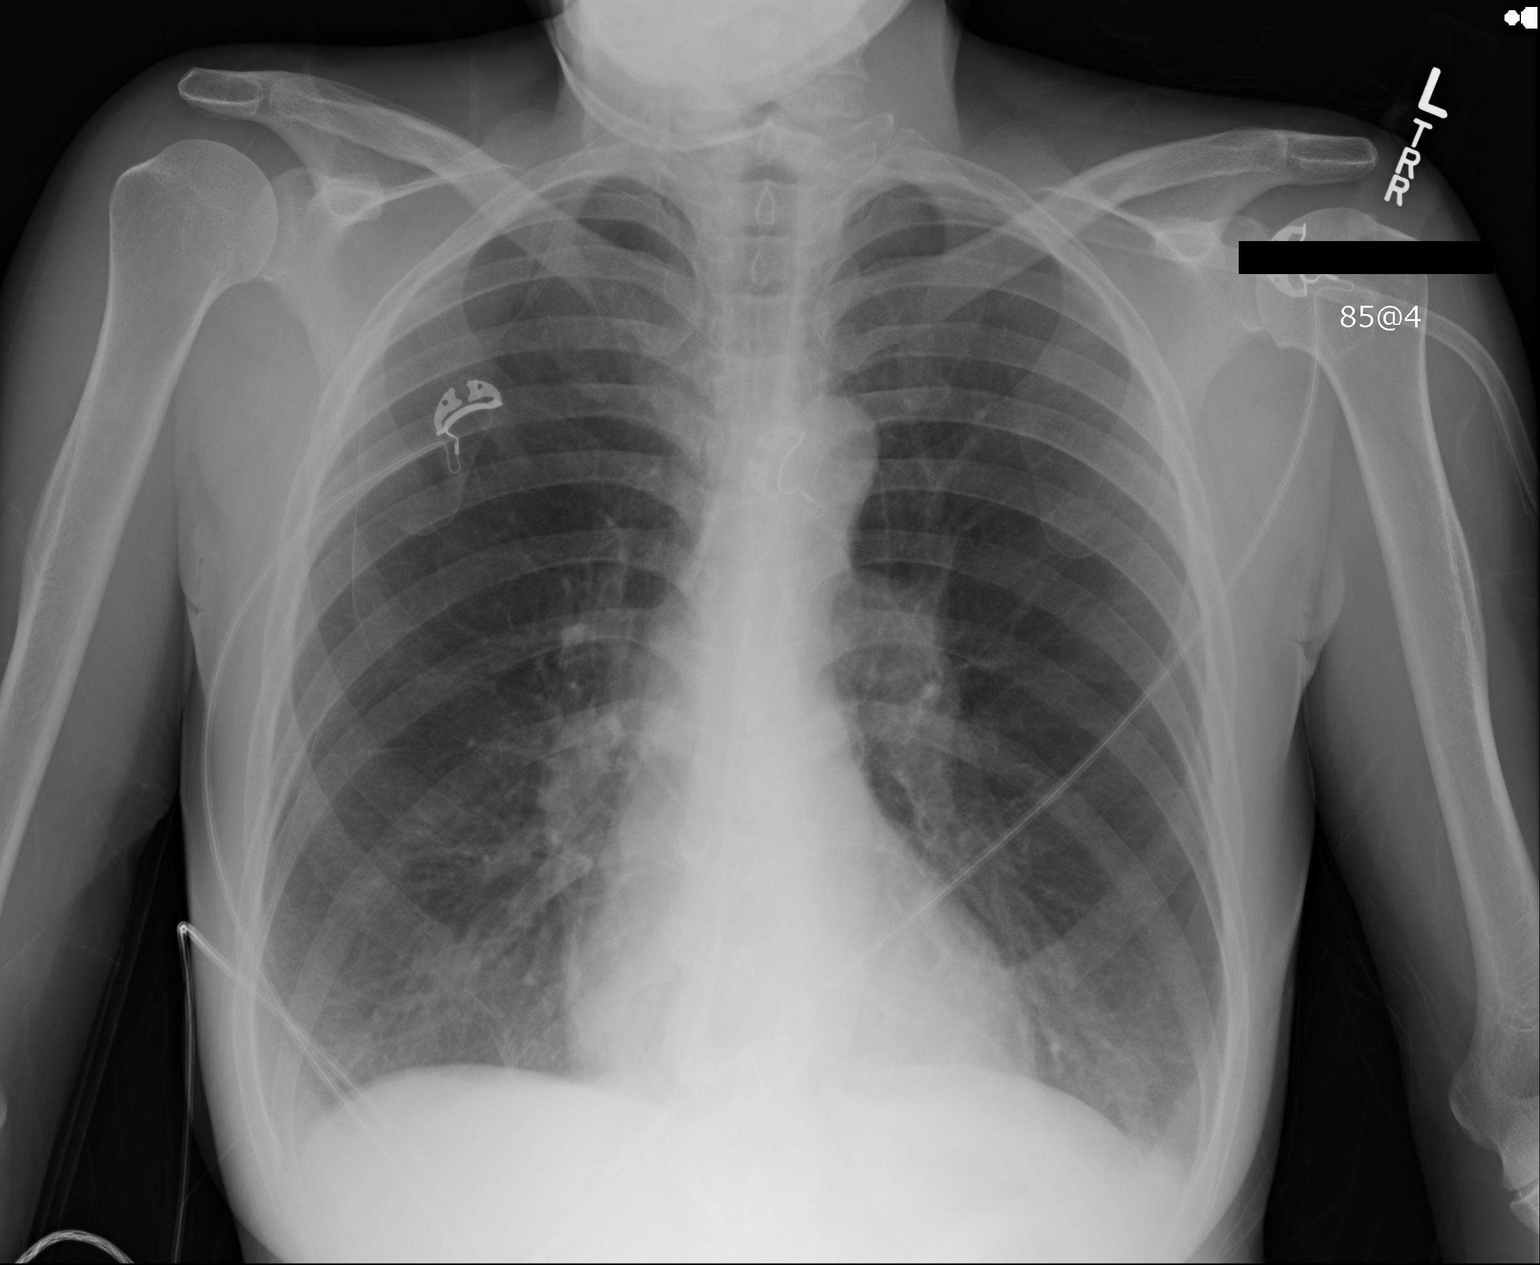

[1 of 1 positions shown; findings below may reference images not displayed]

FINDINGS: Hyperinflation compatible with COPD reidentified. No new focal
opacity. No pleural effusion. Heart size is normal. There is a
small, approximately 30%, right pneumothorax. No mediastinal shift.
IMPRESSION: Approximately 30% volume right pneumothorax. Critical Value/emergent
results were called by telephone at the time of interpretation on
07/09/2014 at [DATE] to Elia Mohsin, RN, who verbally acknowledged
these results.

## 2015-10-23 IMAGING — CR DG CHEST 1V PORT
1 series · 1 of 1 positions shown · non-contrast
Comparison: 07/09/2014

CLINICAL DATA: Placement of right-sided chest tube for
pneumothorax.

EXAM:
PORTABLE CHEST - 1 VIEW

[ap]
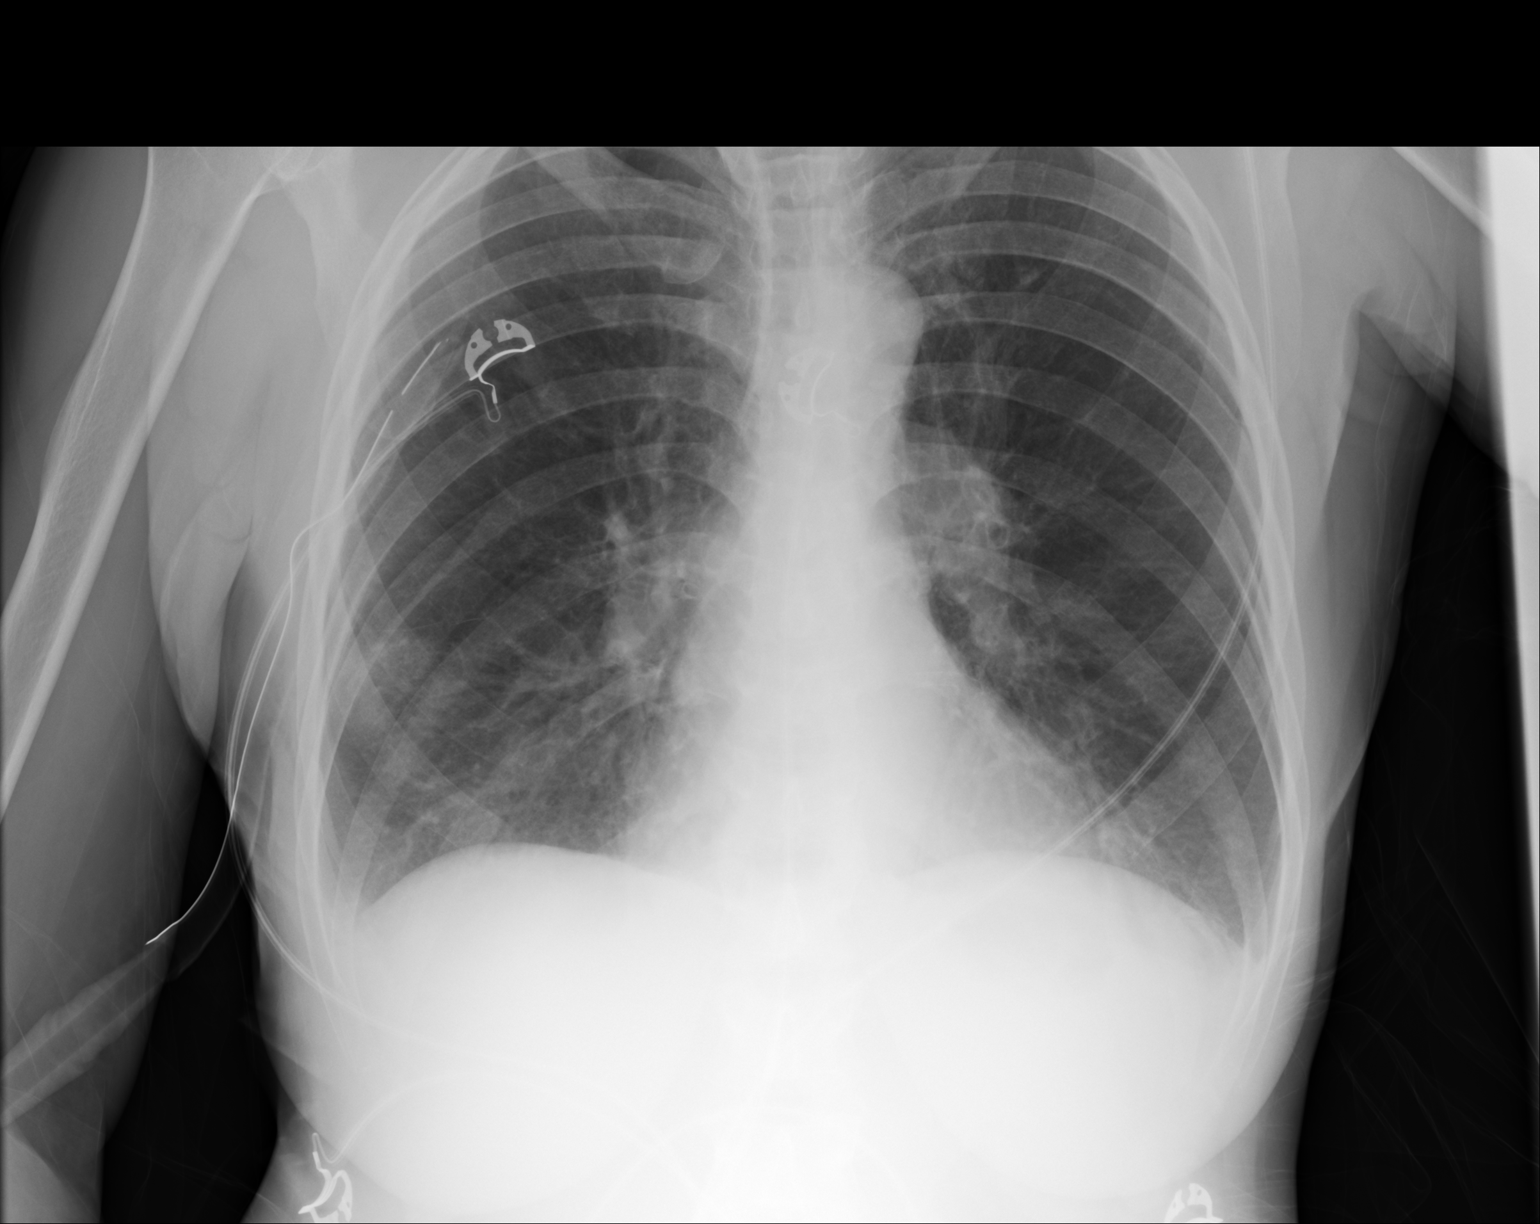

[1 of 1 positions shown; findings below may reference images not displayed]

FINDINGS: The right-sided chest tube is in good position. No complicating
features. Resolution of the right-sided pneumothorax. Patchy areas
of bibasilar atelectasis are noted.
IMPRESSION: Right-sided chest tube in good position with resolution of
pneumothorax.

## 2015-10-24 IMAGING — CR DG CHEST 1V PORT
1 series · 1 of 1 positions shown · non-contrast
Comparison: 1 day prior

CLINICAL DATA: Subsequent encounter for chest tube in place for
pneumothorax.

EXAM:
PORTABLE CHEST - 1 VIEW

[ap]
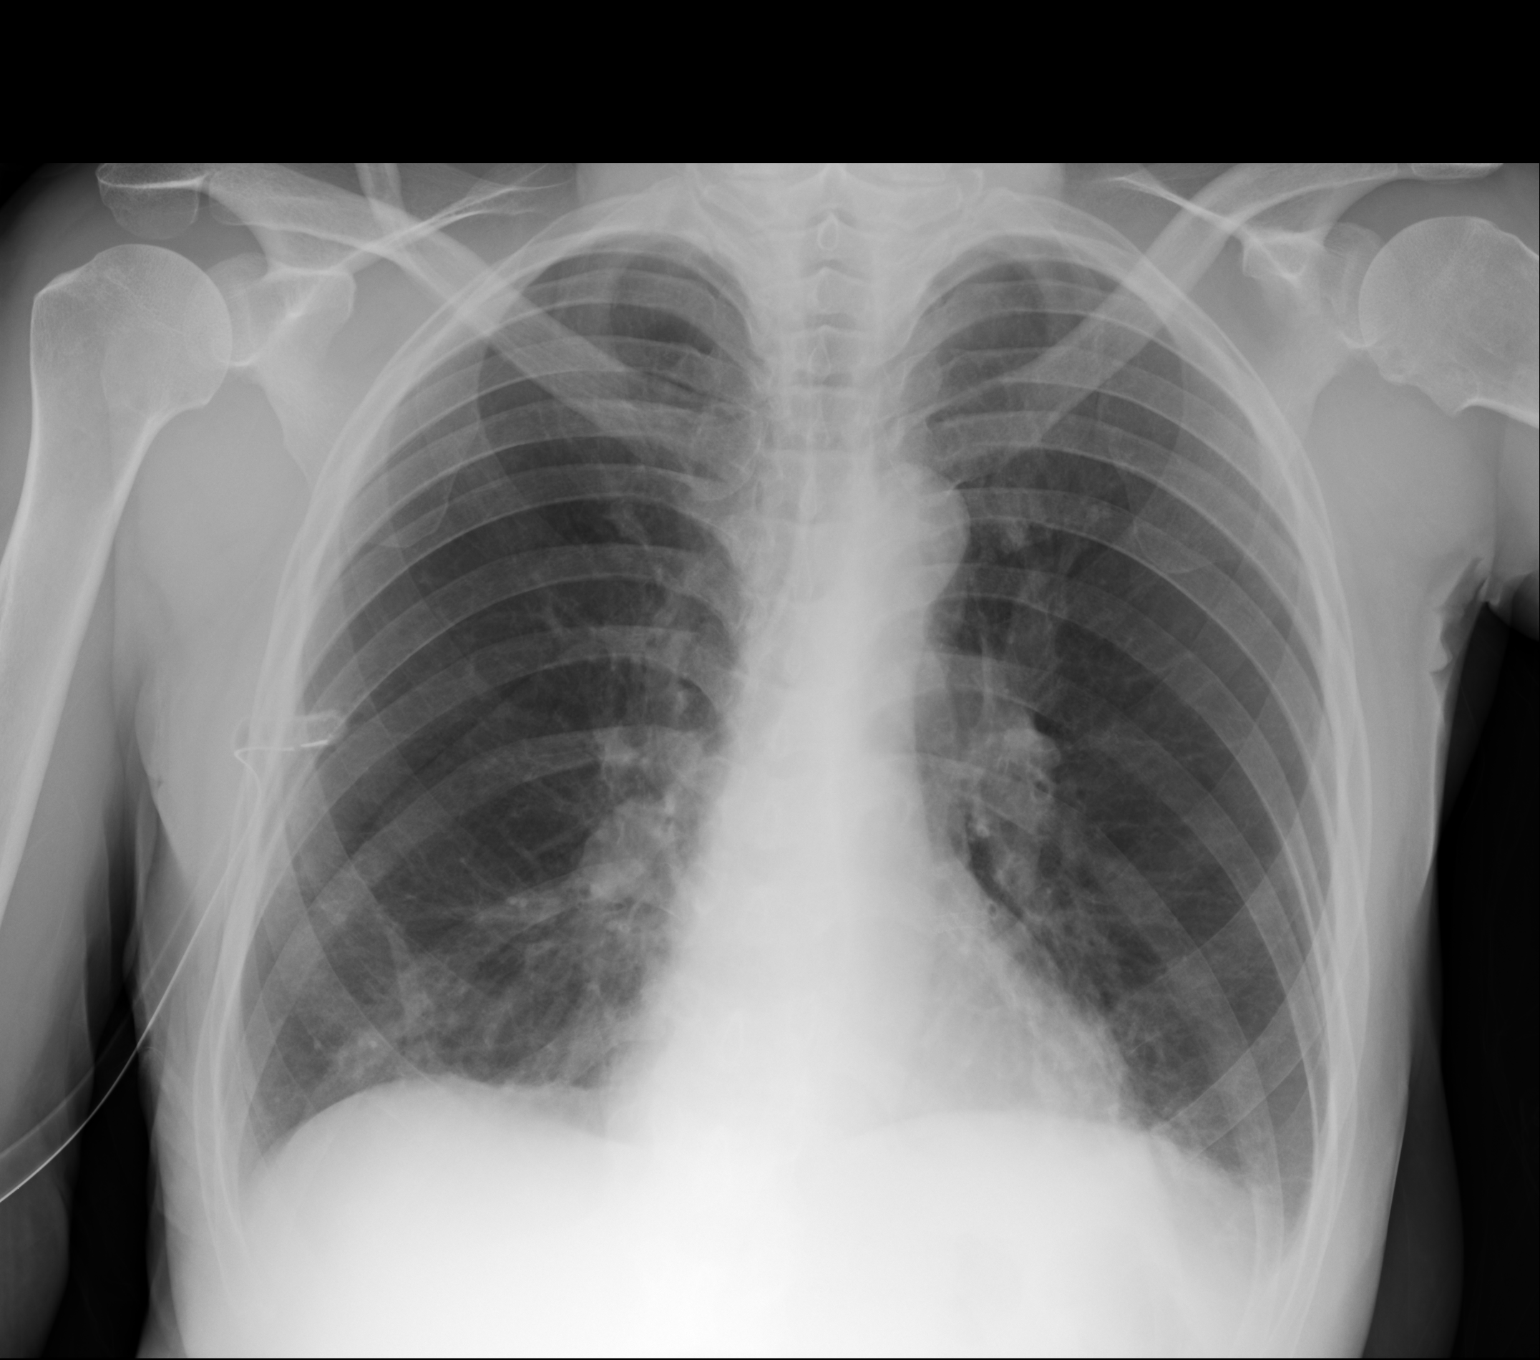

[1 of 1 positions shown; findings below may reference images not displayed]

FINDINGS: Right chest tube has been retracted. Side port is just inside the
chest wall. Midline trachea. Normal heart size. No pleural effusion
or pneumothorax. Patchy bibasilar airspace disease.
IMPRESSION: Right chest tube remaining in place, without pneumothorax.

Similar dependent subsegmental atelectasis.

## 2015-10-25 IMAGING — CR DG CHEST 1V PORT
1 series · 1 of 1 positions shown · non-contrast
Comparison: 07/11/2014

CLINICAL DATA: Chest tube.

EXAM:
PORTABLE CHEST - 1 VIEW

[ap]
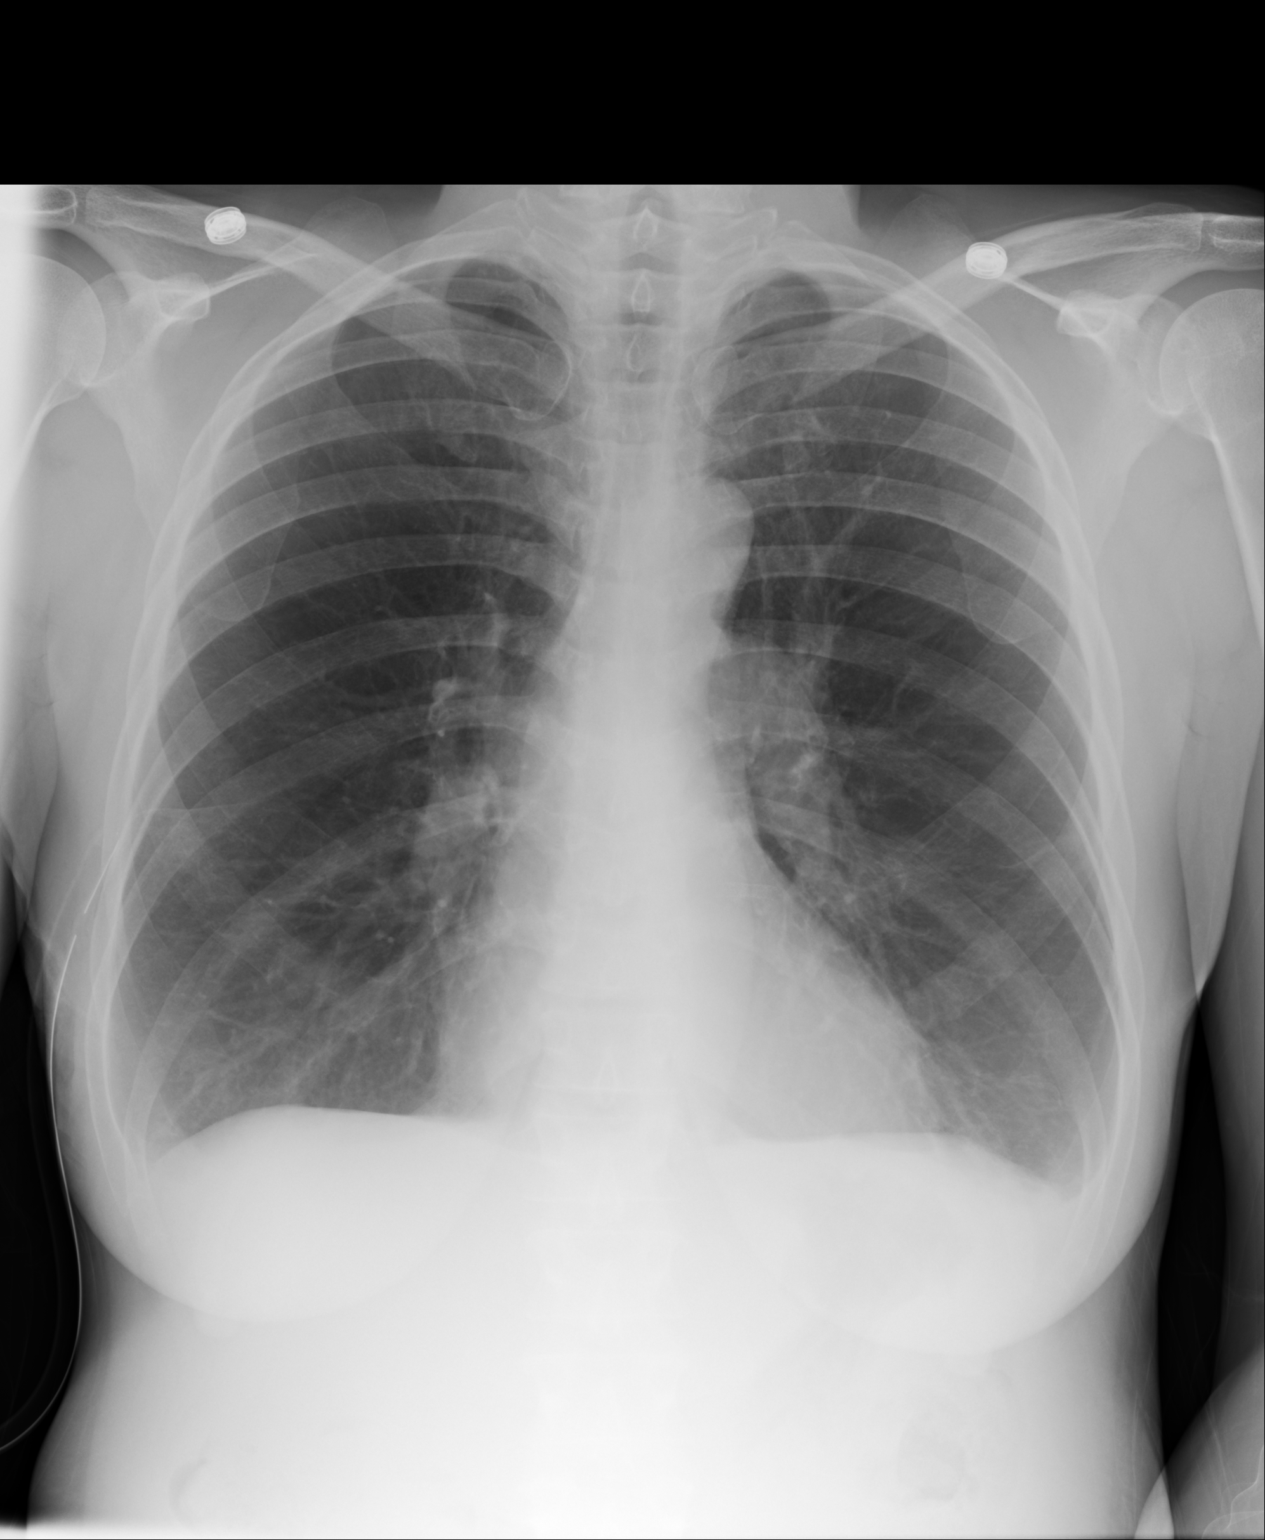

[1 of 1 positions shown; findings below may reference images not displayed]

FINDINGS: A right-sided chest tube has been completely removed from the
pleural cavity, tip now at the rib margin and side port in the soft
tissues. No pneumothorax.

Trace bilateral pleural effusions. Normal heart size. Stable aortic
and hilar contours.

Pulmonary hyperinflation.

These results will be called to the ordering clinician or
representative by the Radiologist Assistant, and communication
documented in the PACS or zVision Dashboard.
IMPRESSION: 1. Interval retraction of the right chest tube, now external to the
pleural cavity. No pneumothorax.
2. Trace pleural effusions.

## 2015-10-25 IMAGING — CR DG CHEST 2V
1 series · 2 of 2 positions shown · non-contrast
Comparison: Same day.

CLINICAL DATA: Right-sided chest tube removal.

EXAM:
CHEST  2 VIEW

[Series 1: dxr chest pa (or ap) and lateral · 0.14mm/px · 2 of 2 slices shown]
[im 1/2]
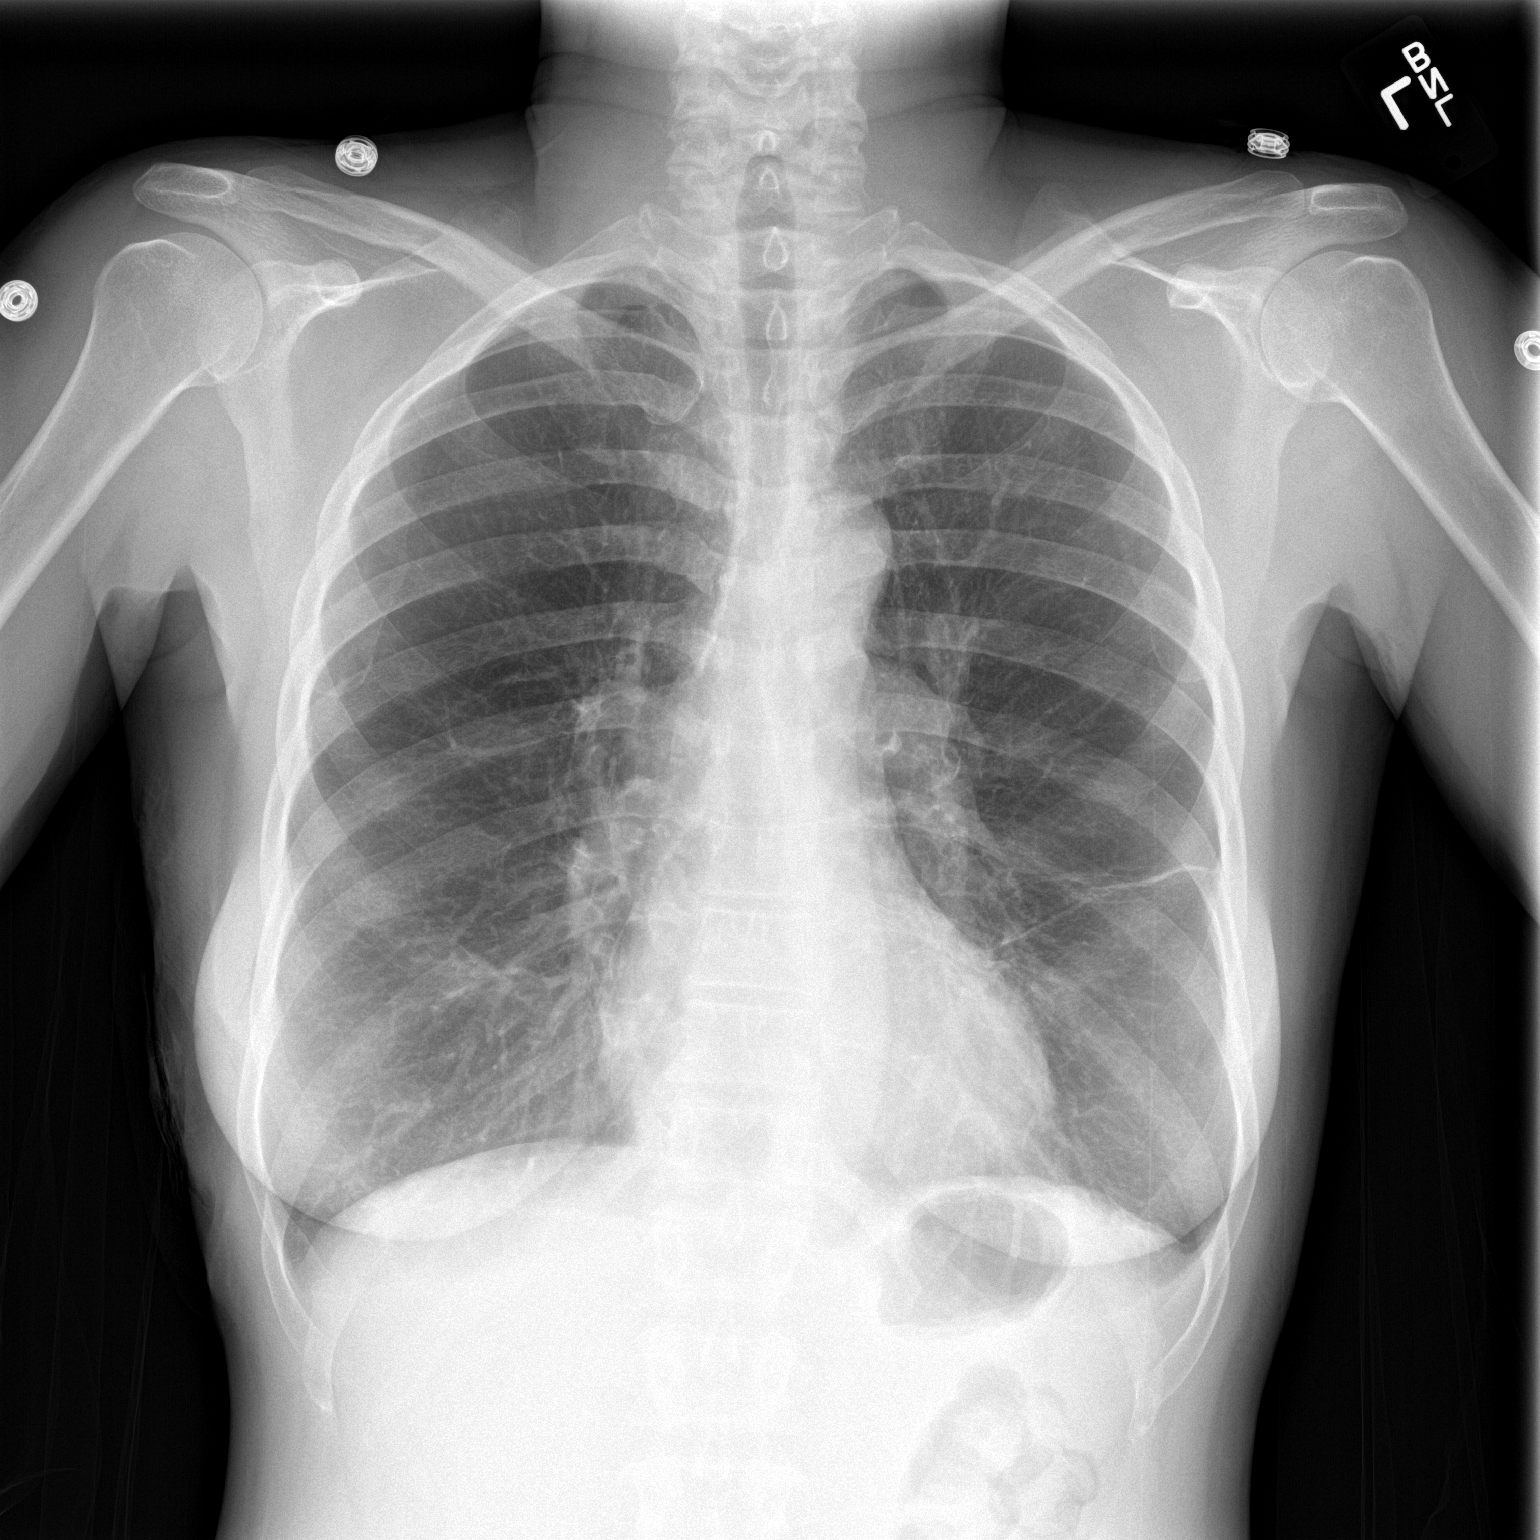
[im 2/2]
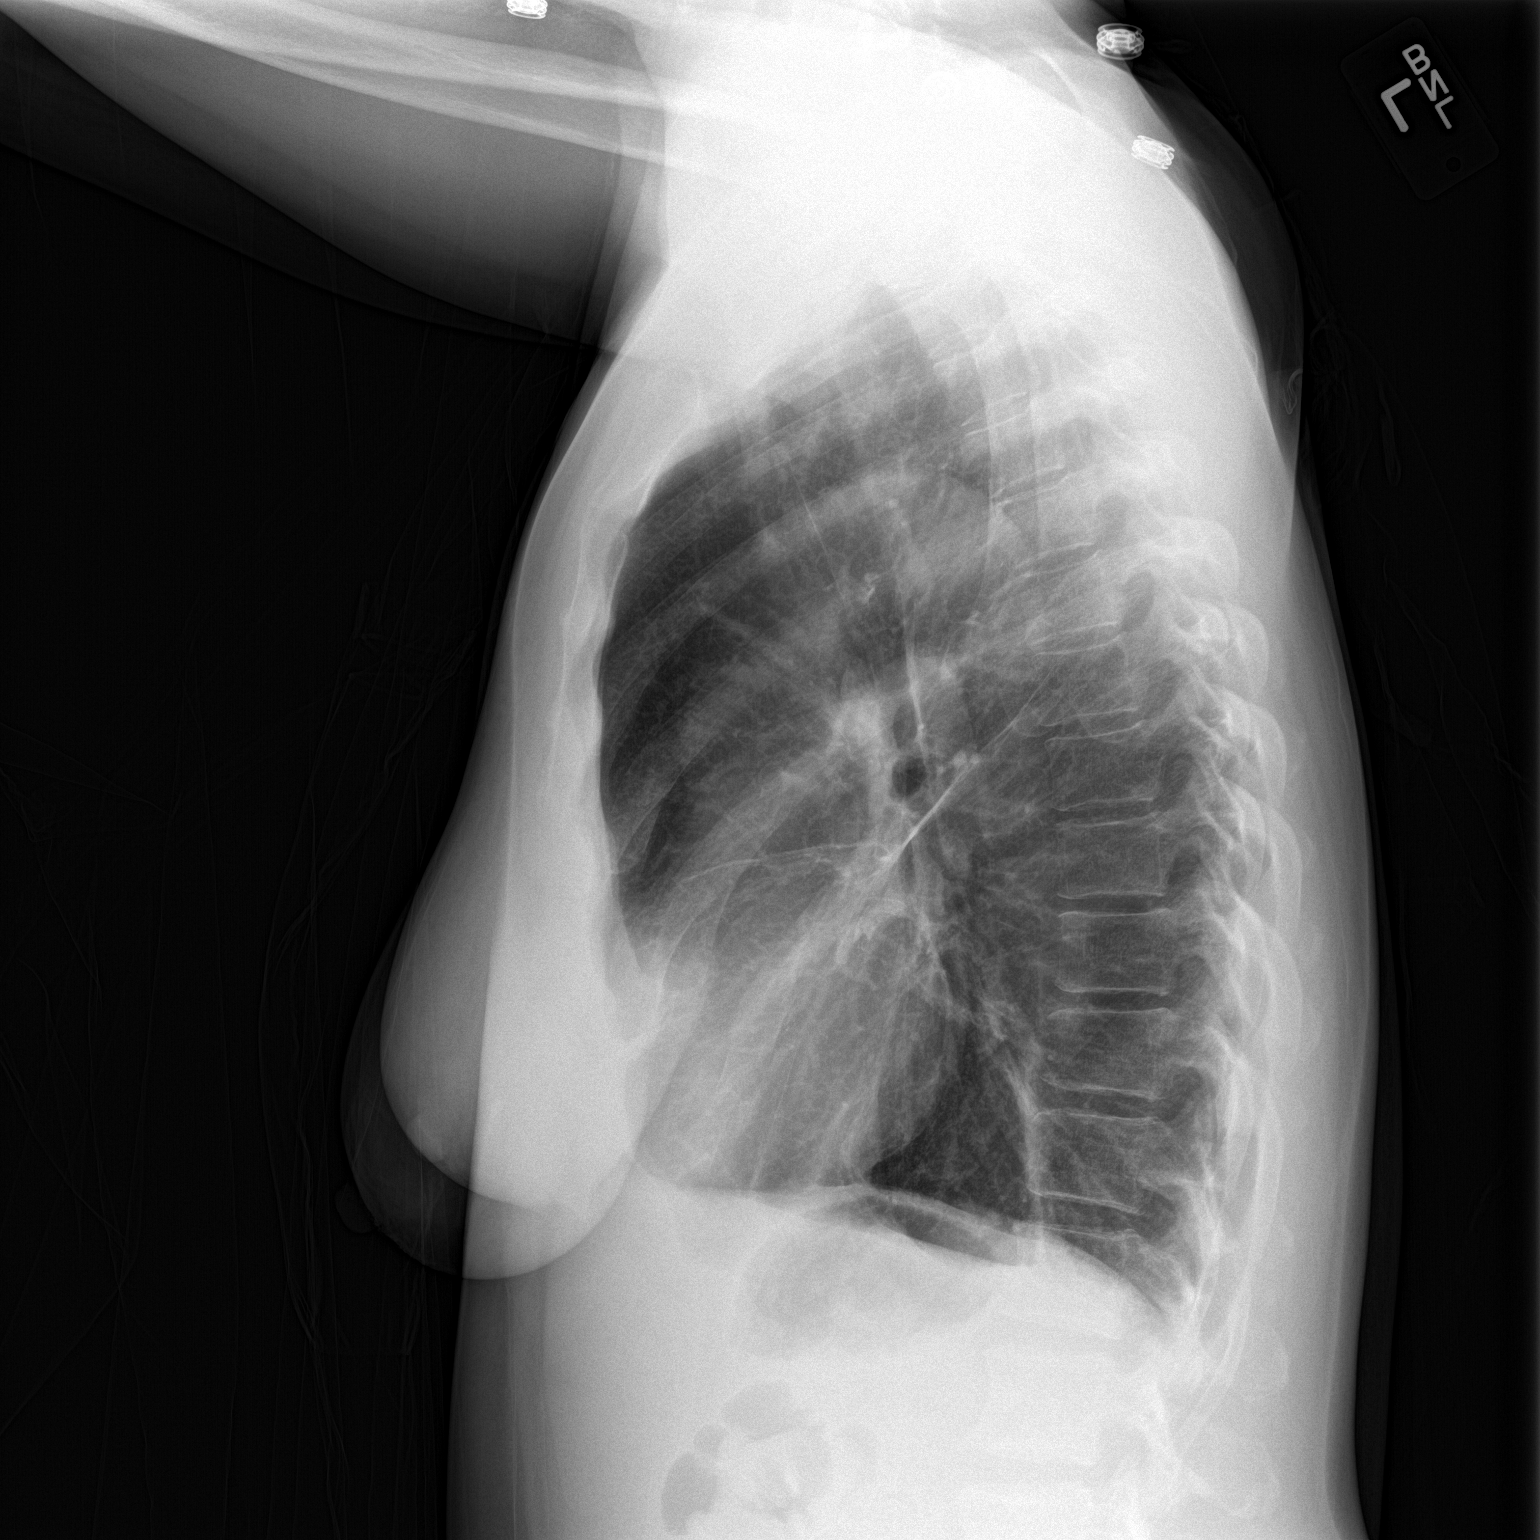

[2 of 2 positions shown; findings below may reference images not displayed]

FINDINGS: The heart size and mediastinal contours are within normal limits. No
pneumothorax or pleural effusion is noted. Right-sided chest tube
has been completely removed. Linear density is noted in lingular
region consistent with subsegmental atelectasis. Right lung is
clear. The visualized skeletal structures are unremarkable.
IMPRESSION: No pneumothorax seen following right-sided chest tube removal.
Interval development of left lingular subsegmental atelectasis.

## 2015-11-04 IMAGING — CR DG CHEST 2V
1 series · 2 of 2 positions shown · non-contrast
Comparison: 07/12/2014.

CLINICAL DATA: Pneumothorax.

EXAM:
CHEST  2 VIEW

[Series 1: dxr chest pa (or ap) and lateral · 0.14mm/px · 2 of 2 slices shown]
[im 1/2]
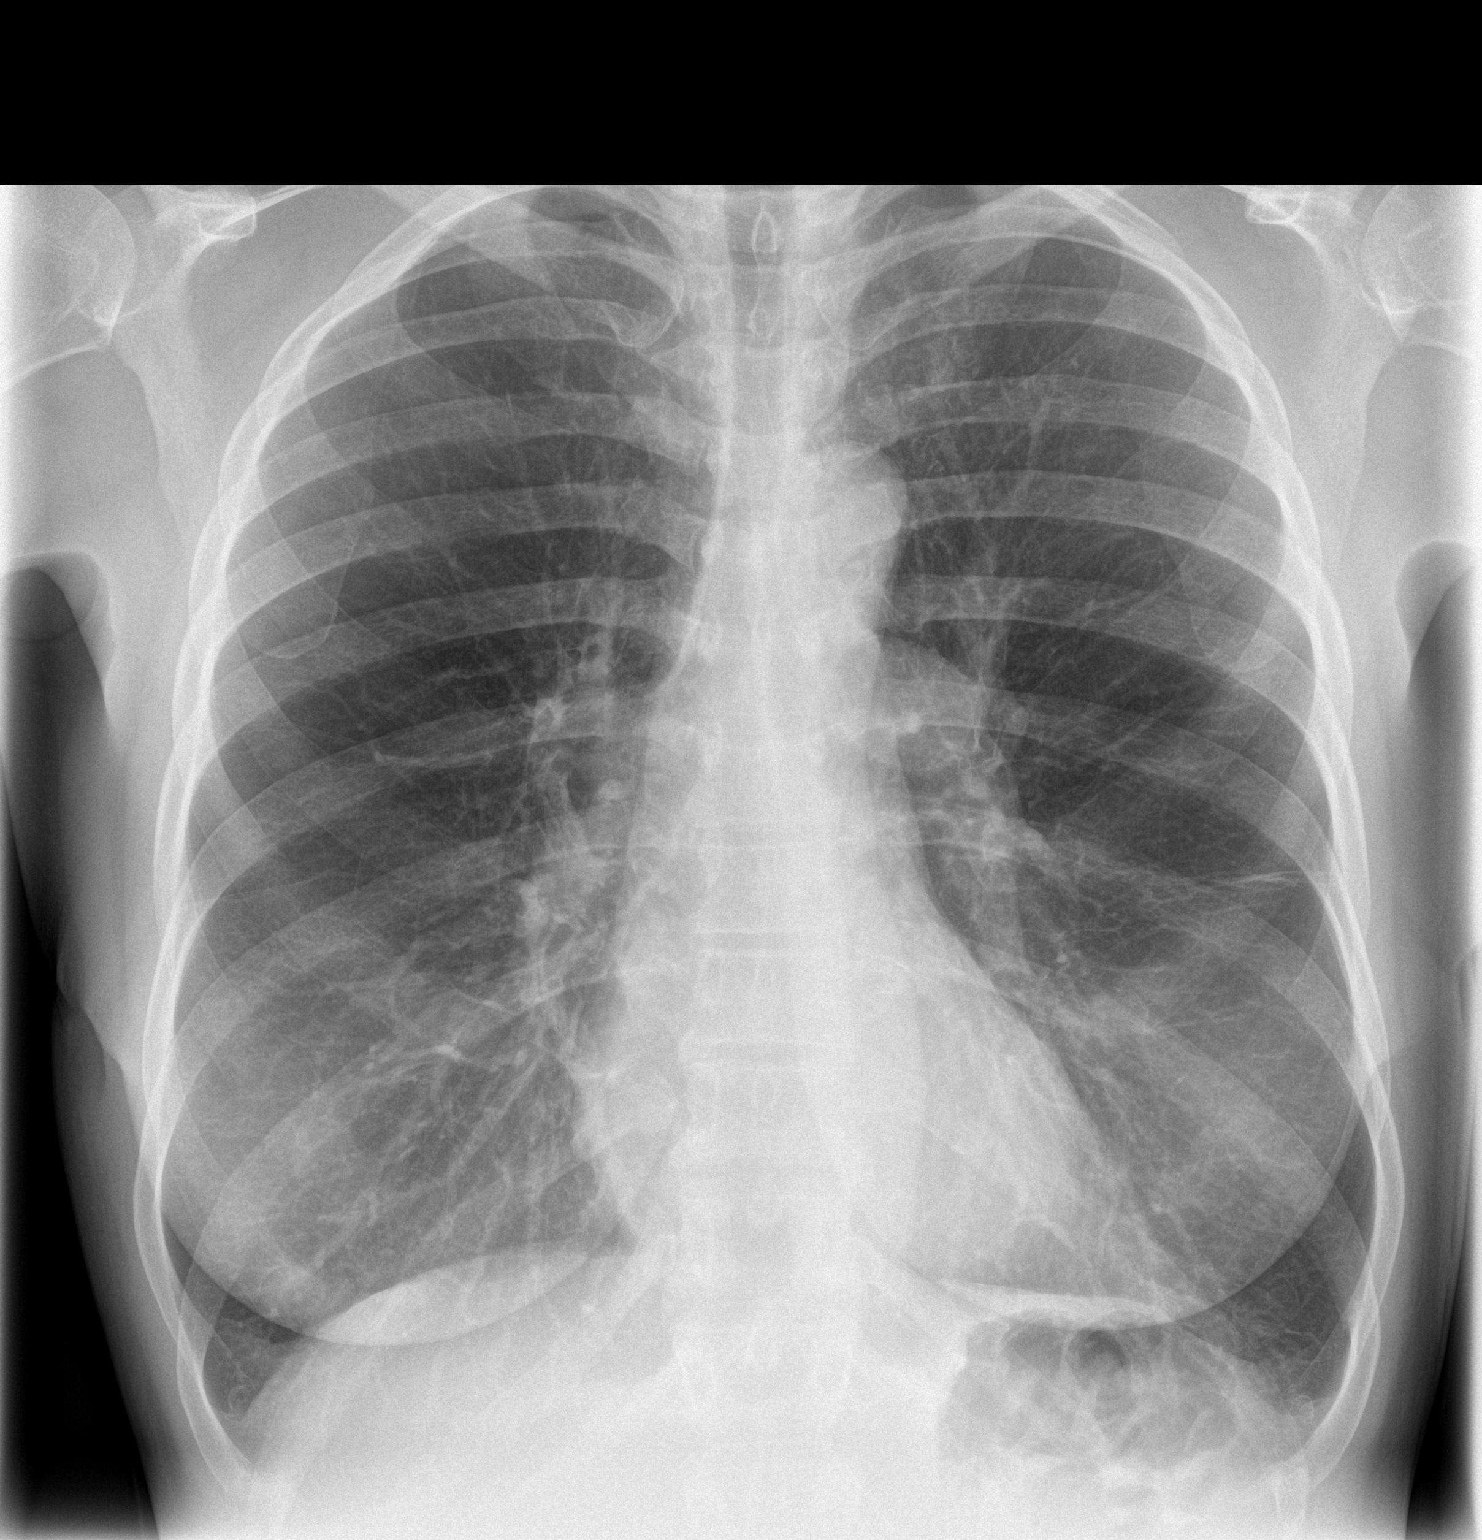
[im 2/2]
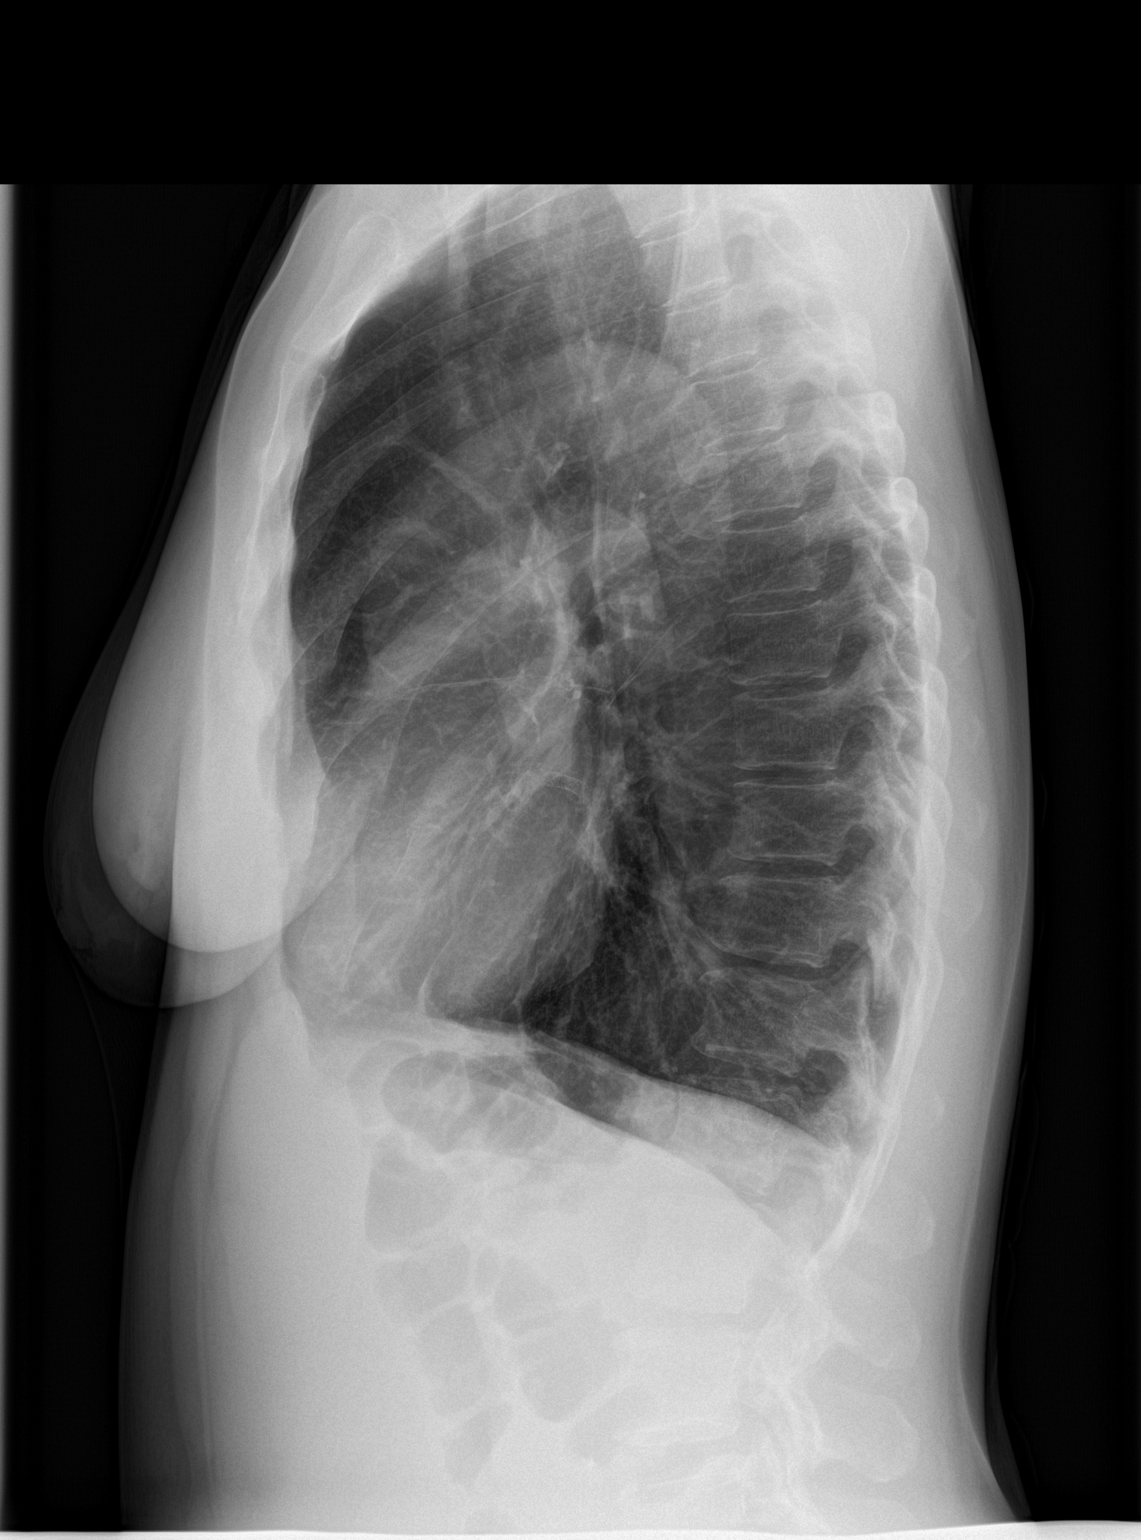

[2 of 2 positions shown; findings below may reference images not displayed]

FINDINGS: Mediastinum and hilar structures are normal. Bibasilar
pleural-parenchymal thickening noted consistent with scarring. No
focal infiltrate noted. No pleural effusion or pneumothorax. Heart
size normal. No acute bony abnormality.
IMPRESSION: No active cardiopulmonary disease.

## 2015-11-05 ENCOUNTER — Emergency Department: Payer: Medicaid Other

## 2015-11-05 ENCOUNTER — Inpatient Hospital Stay
Admission: EM | Admit: 2015-11-05 | Discharge: 2015-11-07 | DRG: 190 | Disposition: A | Discharge status: Home / Self Care | Payer: Medicaid Other | Attending: Internal Medicine | Admitting: Internal Medicine

## 2015-11-05 DIAGNOSIS — Z9981 Dependence on supplemental oxygen: Secondary | ICD-10-CM | POA: Diagnosis not present

## 2015-11-05 DIAGNOSIS — I509 Heart failure, unspecified: Secondary | ICD-10-CM | POA: Diagnosis present

## 2015-11-05 DIAGNOSIS — J449 Chronic obstructive pulmonary disease, unspecified: Secondary | ICD-10-CM | POA: Diagnosis present

## 2015-11-05 DIAGNOSIS — Z79899 Other long term (current) drug therapy: Secondary | ICD-10-CM | POA: Diagnosis not present

## 2015-11-05 DIAGNOSIS — J9620 Acute and chronic respiratory failure, unspecified whether with hypoxia or hypercapnia: Secondary | ICD-10-CM | POA: Diagnosis present

## 2015-11-05 DIAGNOSIS — F17210 Nicotine dependence, cigarettes, uncomplicated: Secondary | ICD-10-CM | POA: Diagnosis present

## 2015-11-05 DIAGNOSIS — Z803 Family history of malignant neoplasm of breast: Secondary | ICD-10-CM

## 2015-11-05 DIAGNOSIS — F1410 Cocaine abuse, uncomplicated: Secondary | ICD-10-CM | POA: Diagnosis present

## 2015-11-05 DIAGNOSIS — Z8249 Family history of ischemic heart disease and other diseases of the circulatory system: Secondary | ICD-10-CM | POA: Diagnosis not present

## 2015-11-05 DIAGNOSIS — Z823 Family history of stroke: Secondary | ICD-10-CM

## 2015-11-05 DIAGNOSIS — Z7982 Long term (current) use of aspirin: Secondary | ICD-10-CM

## 2015-11-05 DIAGNOSIS — Z7951 Long term (current) use of inhaled steroids: Secondary | ICD-10-CM | POA: Diagnosis not present

## 2015-11-05 DIAGNOSIS — Z801 Family history of malignant neoplasm of trachea, bronchus and lung: Secondary | ICD-10-CM

## 2015-11-05 DIAGNOSIS — I2510 Atherosclerotic heart disease of native coronary artery without angina pectoris: Secondary | ICD-10-CM | POA: Diagnosis present

## 2015-11-05 DIAGNOSIS — Z9119 Patient's noncompliance with other medical treatment and regimen: Secondary | ICD-10-CM

## 2015-11-05 DIAGNOSIS — E7800 Pure hypercholesterolemia, unspecified: Secondary | ICD-10-CM | POA: Diagnosis present

## 2015-11-05 DIAGNOSIS — J441 Chronic obstructive pulmonary disease with (acute) exacerbation: Secondary | ICD-10-CM | POA: Diagnosis present

## 2015-11-05 DIAGNOSIS — I110 Hypertensive heart disease with heart failure: Secondary | ICD-10-CM | POA: Diagnosis present

## 2015-11-05 DIAGNOSIS — R634 Abnormal weight loss: Secondary | ICD-10-CM | POA: Diagnosis present

## 2015-11-05 DIAGNOSIS — Z7952 Long term (current) use of systemic steroids: Secondary | ICD-10-CM | POA: Diagnosis not present

## 2015-11-05 LAB — CBC WITH DIFFERENTIAL/PLATELET
BASOS ABS: 0 10*3/uL (ref 0–0.1)
EOS ABS: 0.1 10*3/uL (ref 0–0.7)
HCT: 41.5 % (ref 35.0–47.0)
Hemoglobin: 13.5 g/dL (ref 12.0–16.0)
Lymphs Abs: 1.1 10*3/uL (ref 1.0–3.6)
MCH: 28.7 pg (ref 26.0–34.0)
MCHC: 32.4 g/dL (ref 32.0–36.0)
MCV: 88.4 fL (ref 80.0–100.0)
Monocytes Absolute: 0.5 10*3/uL (ref 0.2–0.9)
Monocytes Relative: 7 %
Neutro Abs: 6.1 10*3/uL (ref 1.4–6.5)
Neutrophils Relative %: 77 %
PLATELETS: 253 10*3/uL (ref 150–440)
RBC: 4.69 MIL/uL (ref 3.80–5.20)
RDW: 15.1 % — ABNORMAL HIGH (ref 11.5–14.5)
WBC: 7.8 10*3/uL (ref 3.6–11.0)

## 2015-11-05 LAB — BLOOD GAS, VENOUS
ACID-BASE EXCESS: 2.2 mmol/L (ref 0.0–3.0)
BICARBONATE: 32.8 meq/L — AB (ref 21.0–28.0)
Delivery systems: POSITIVE
FIO2: 0.5
Mechanical Rate: 10
PATIENT TEMPERATURE: 37
pCO2, Ven: 82 mmHg (ref 44.0–60.0)
pH, Ven: 7.21 — ABNORMAL LOW (ref 7.320–7.430)
pO2, Ven: <31 mmHg — ABNORMAL LOW (ref 31.0–45.0)

## 2015-11-05 LAB — BASIC METABOLIC PANEL
ANION GAP: 7 (ref 5–15)
BUN: 13 mg/dL (ref 6–20)
CO2: 29 mmol/L (ref 22–32)
Calcium: 8.9 mg/dL (ref 8.9–10.3)
Chloride: 104 mmol/L (ref 101–111)
Creatinine, Ser: 0.69 mg/dL (ref 0.44–1.00)
Glucose, Bld: 110 mg/dL — ABNORMAL HIGH (ref 65–99)
POTASSIUM: 4 mmol/L (ref 3.5–5.1)
SODIUM: 140 mmol/L (ref 135–145)

## 2015-11-05 LAB — BRAIN NATRIURETIC PEPTIDE: B NATRIURETIC PEPTIDE 5: 36 pg/mL (ref 0.0–100.0)

## 2015-11-05 LAB — TROPONIN I

## 2015-11-05 MED ORDER — SODIUM CHLORIDE 0.9% FLUSH: 3.0000 mL | INTRAVENOUS | Status: DC | PRN

## 2015-11-05 MED ORDER — POLYETHYLENE GLYCOL 3350 17 G PO PACK: 17.0000 g | PACK | Freq: Every day | ORAL | Status: DC | PRN

## 2015-11-05 MED ORDER — LORAZEPAM 0.5 MG PO TABS
0.5000 mg | ORAL_TABLET | Freq: Three times a day (TID) | ORAL | Status: DC | PRN
  Administered 2015-11-05 – 2015-11-06 (×2): 0.5 mg via ORAL
  Filled 2015-11-05 (×2): qty 1

## 2015-11-05 MED ORDER — MOMETASONE FURO-FORMOTEROL FUM 200-5 MCG/ACT IN AERO
2.0000 | INHALATION_SPRAY | Freq: Two times a day (BID) | RESPIRATORY_TRACT | Status: DC
  Administered 2015-11-05 – 2015-11-07 (×4): 2 via RESPIRATORY_TRACT
  Filled 2015-11-05: qty 8.8

## 2015-11-05 MED ORDER — METHYLPREDNISOLONE SODIUM SUCC 125 MG IJ SOLR
60.0000 mg | Freq: Two times a day (BID) | INTRAMUSCULAR | Status: DC
  Administered 2015-11-05 – 2015-11-07 (×3): 60 mg via INTRAVENOUS
  Filled 2015-11-05 (×3): qty 2

## 2015-11-05 MED ORDER — ONDANSETRON HCL 4 MG/2ML IJ SOLN: 4.0000 mg | Freq: Four times a day (QID) | INTRAMUSCULAR | Status: DC | PRN

## 2015-11-05 MED ORDER — HYDROCODONE-ACETAMINOPHEN 5-325 MG PO TABS
1.0000 | ORAL_TABLET | ORAL | Status: DC | PRN
  Administered 2015-11-05 – 2015-11-06 (×4): 1 via ORAL
  Filled 2015-11-05 (×2): qty 1
  Filled 2015-11-05: qty 2
  Filled 2015-11-05: qty 1

## 2015-11-05 MED ORDER — SODIUM CHLORIDE 0.9% FLUSH
3.0000 mL | Freq: Two times a day (BID) | INTRAVENOUS | Status: DC
  Administered 2015-11-05 – 2015-11-06 (×4): 3 mL via INTRAVENOUS

## 2015-11-05 MED ORDER — DOXYCYCLINE HYCLATE 100 MG PO TABS: 100.0000 mg | ORAL_TABLET | Freq: Two times a day (BID) | ORAL | Status: DC

## 2015-11-05 MED ORDER — BISACODYL 10 MG RE SUPP: 10.0000 mg | Freq: Every day | RECTAL | Status: DC | PRN

## 2015-11-05 MED ORDER — ASPIRIN EC 81 MG PO TBEC
81.0000 mg | DELAYED_RELEASE_TABLET | Freq: Every day | ORAL | Status: DC
  Administered 2015-11-05 – 2015-11-07 (×3): 81 mg via ORAL
  Filled 2015-11-05 (×3): qty 1

## 2015-11-05 MED ORDER — ALBUTEROL SULFATE (2.5 MG/3ML) 0.083% IN NEBU
2.5000 mg | INHALATION_SOLUTION | Freq: Once | RESPIRATORY_TRACT | Status: AC
  Administered 2015-11-05: 2.5 mg via RESPIRATORY_TRACT
  Filled 2015-11-05: qty 3

## 2015-11-05 MED ORDER — ENOXAPARIN SODIUM 40 MG/0.4ML ~~LOC~~ SOLN
40.0000 mg | SUBCUTANEOUS | Status: DC
  Administered 2015-11-05 – 2015-11-06 (×2): 40 mg via SUBCUTANEOUS
  Filled 2015-11-05 (×2): qty 0.4

## 2015-11-05 MED ORDER — LORAZEPAM 1 MG PO TABS
1.0000 mg | ORAL_TABLET | Freq: Once | ORAL | Status: AC
  Administered 2015-11-05: 1 mg via ORAL

## 2015-11-05 MED ORDER — ALBUTEROL SULFATE (2.5 MG/3ML) 0.083% IN NEBU
INHALATION_SOLUTION | RESPIRATORY_TRACT | Status: AC
  Filled 2015-11-05: qty 6

## 2015-11-05 MED ORDER — SODIUM CHLORIDE 0.9 % IV SOLN: 250.0000 mL | INTRAVENOUS | Status: DC | PRN

## 2015-11-05 MED ORDER — ACETAMINOPHEN 650 MG RE SUPP: 650.0000 mg | Freq: Four times a day (QID) | RECTAL | Status: DC | PRN

## 2015-11-05 MED ORDER — LORAZEPAM 1 MG PO TABS
ORAL_TABLET | ORAL | Status: AC
  Administered 2015-11-05: 1 mg via ORAL
  Filled 2015-11-05: qty 1

## 2015-11-05 MED ORDER — DOXYCYCLINE HYCLATE 100 MG IV SOLR
100.0000 mg | Freq: Two times a day (BID) | INTRAVENOUS | Status: DC
  Administered 2015-11-05: 100 mg via INTRAVENOUS
  Filled 2015-11-05 (×2): qty 100

## 2015-11-05 MED ORDER — GUAIFENESIN-DM 100-10 MG/5ML PO SYRP
10.0000 mL | ORAL_SOLUTION | Freq: Three times a day (TID) | ORAL | Status: DC | PRN
  Filled 2015-11-05: qty 10

## 2015-11-05 MED ORDER — SODIUM CHLORIDE 0.9% FLUSH
3.0000 mL | Freq: Two times a day (BID) | INTRAVENOUS | Status: DC
Start: 2015-11-05 — End: 2015-11-07
  Administered 2015-11-05 – 2015-11-06 (×4): 3 mL via INTRAVENOUS

## 2015-11-05 MED ORDER — IPRATROPIUM-ALBUTEROL 0.5-2.5 (3) MG/3ML IN SOLN
3.0000 mL | RESPIRATORY_TRACT | Status: DC
  Administered 2015-11-05 – 2015-11-07 (×11): 3 mL via RESPIRATORY_TRACT
  Filled 2015-11-05 (×12): qty 3

## 2015-11-05 MED ORDER — DOXYCYCLINE HYCLATE 100 MG PO TABS
100.0000 mg | ORAL_TABLET | Freq: Two times a day (BID) | ORAL | Status: DC
  Administered 2015-11-05 – 2015-11-07 (×4): 100 mg via ORAL
  Filled 2015-11-05 (×4): qty 1

## 2015-11-05 MED ORDER — TIOTROPIUM BROMIDE MONOHYDRATE 18 MCG IN CAPS
18.0000 ug | ORAL_CAPSULE | Freq: Every day | RESPIRATORY_TRACT | Status: DC
  Administered 2015-11-06 – 2015-11-07 (×2): 18 ug via RESPIRATORY_TRACT
  Filled 2015-11-05: qty 5

## 2015-11-05 MED ORDER — ACETAMINOPHEN 325 MG PO TABS: 650.0000 mg | ORAL_TABLET | Freq: Four times a day (QID) | ORAL | Status: DC | PRN

## 2015-11-05 MED ORDER — ENALAPRIL MALEATE 10 MG PO TABS: 10.0000 mg | ORAL_TABLET | Freq: Every day | ORAL | Status: DC

## 2015-11-05 MED ORDER — ONDANSETRON HCL 4 MG PO TABS: 4.0000 mg | ORAL_TABLET | Freq: Four times a day (QID) | ORAL | Status: DC | PRN

## 2015-11-05 NOTE — Progress Notes (Signed)
Pt taken off bipap and placed on 2lpm [place] per MD Sudini, tolerating well at this time, respiratory rate 20/min, sats 98%

## 2015-11-05 NOTE — ED Provider Notes (Signed)
[facility] Emergency Department Provider Note   ____________________________________________  Time seen: Seen upon arrival to the emergency department  I have reviewed the triage vital signs and the nursing notes.   HISTORY  Chief Complaint Shortness of Breath   HPI Michelle [name] is a 55 y.o. female with a history of COPD as well as CHF and cocaine abuse who is presenting to the emergency department today with shortness of breath. She says that the shortness of breath has been worsening ever since she was discharged from [facility] about one week ago for a similar issue. She denies any pain at this time. In route she was started on CPAP. She was given nitro paste as well as magnesium, Solu-Medrol and 3 duo nebs. Initially she was 60s on her oxygen saturations but came up to 85 to 100% on the CPAP. Per EMS, she has been noncompliant with her prednisone since discharge from [facility]. She says that she is on 1 L nasal cannula oxygen at home.   Past Medical History  Diagnosis Date  . COPD (chronic obstructive pulmonary disease) ([place])   . Emphysema/COPD ([place])   . Hypertension   . High cholesterol   . Pneumothorax   . Coronary artery disease   . Cocaine abuse   . Tobacco abuse     Patient Active Problem List   Diagnosis Date Noted  . Malnutrition of moderate degree ([place]) 10/25/2014  . COPD exacerbation ([place]) 10/24/2014  . Acute respiratory failure with hypoxia ([place]) 10/24/2014  . Tobacco abuse 10/24/2014  . Community acquired pneumonia 10/24/2014    Past Surgical History  Procedure Laterality Date  . Cardiac catheterization    . Chest tube insertion      Current Outpatient Rx  Name  Route  Sig  Dispense  Refill  . albuterol (ACCUNEB) 1.25 MG/3ML nebulizer solution   Nebulization   Take 1 ampule by nebulization every 6 (six) hours as needed for wheezing.         Marland Kitchen albuterol (PROVENTIL HFA;VENTOLIN HFA) 108 (90 BASE) MCG/ACT inhaler   Inhalation  Inhale 2 puffs into the lungs every 6 (six) hours as needed for wheezing or shortness of breath.         Marland Kitchen aspirin EC 81 MG tablet   Oral   Take 81 mg by mouth daily.         . enalapril (VASOTEC) 10 MG tablet   Oral   Take 1 tablet (10 mg total) by mouth daily.   30 tablet   0   . feeding supplement, ENSURE ENLIVE, (ENSURE ENLIVE) LIQD   Oral   Take 237 mLs by mouth 2 (two) times daily between meals.   237 mL   12   . Fluticasone-Salmeterol (ADVAIR) 250-50 MCG/DOSE AEPB   Inhalation   Inhale 1 puff into the lungs 2 (two) times daily.         Marland Kitchen guaiFENesin-dextromethorphan (ROBITUSSIN DM) 100-10 MG/5ML syrup   Oral   Take 10 mLs by mouth every 8 (eight) hours as needed for cough.   118 mL   0   . ibuprofen (ADVIL,MOTRIN) 800 MG tablet   Oral   Take 1 tablet (800 mg total) by mouth every 8 (eight) hours as needed.   15 tablet   0   . predniSONE (STERAPRED UNI-PAK 21 TAB) 10 MG (21) TBPK tablet   Oral   Take 1 tablet (10 mg total) by mouth daily. Take 6 tablets by mouth for  1 day followed by  5 tablets by mouth for 1 day followed by  4 tablets by mouth for 1 day followed by  3 tablets by mouth for 1 day followed by  2 tablets by mouth for 1 day followed by  1 tablet by mouth for a day and stop   21 tablet   0   . tiotropium (SPIRIVA) 18 MCG inhalation capsule   Inhalation   Place 18 mcg into inhaler and inhale daily.           Allergies No known allergies  Family History  Problem Relation Age of Onset  . Lung cancer Mother   . CVA Father   . CAD Father   . Breast cancer Maternal Aunt 67    Social History Social History  Substance Use Topics  . Smoking status: Current Every Day Smoker -- 0.50 packs/day    Types: Cigarettes  . Smokeless tobacco: None  . Alcohol Use: No    Review of Systems Constitutional: No fever/chills Eyes: No visual changes. ENT: No sore throat. Cardiovascular: Denies chest pain. Respiratory: As  above Gastrointestinal: No abdominal pain.  No nausea, no vomiting.  No diarrhea.  No constipation. Genitourinary: Negative for dysuria. Musculoskeletal: Negative for back pain. Skin: Negative for rash. Neurological: Negative for headaches, focal weakness or numbness.  10-point ROS otherwise negative.  ____________________________________________   PHYSICAL EXAM:  VITAL SIGNS: ED Triage Vitals  Enc Vitals Group     BP 11/05/15 0815 101/71 mmHg     Pulse Rate 11/05/15 0815 95     Resp 11/05/15 0815 19     Temp 11/05/15 0815 97 F (36.1 C)     Temp Source 11/05/15 0815 Axillary     SpO2 11/05/15 0815 100 %     Weight 11/05/15 0815 75 lb (34.02 kg)     Height 11/05/15 0815 5\' 2"  (1.575 m)     Head Cir --      Peak [place] --      Pain Score --      Pain Loc --      Pain Edu? --      Excl. in [place]? --     Constitutional: Alert and oriented.  In obvious respiratory distress. Wearing CPAP. Eyes: Conjunctivae are normal. PERRL. EOMI. Head: Atraumatic. Nose: No congestion/rhinnorhea. Mouth/Throat: Mucous membranes are moist.   Neck: No stridor.   Cardiovascular: Normal rate, regular rhythm. Grossly normal heart sounds.   Respiratory: Tachypneic with increased respiratory effort. Supraclavicular retractions.  Lung exam was severely decreased air movement throughout all fields. Near to no wheezing or air movement auscultated. Gastrointestinal: Soft and nontender. No distention.  Musculoskeletal: No lower extremity tenderness nor edema.  No joint effusions. Neurologic:  Normal speech and language. No gross focal neurologic deficits are appreciated. Skin:  Skin is warm, dry and intact. No rash noted. Psychiatric: Mood and affect are normal. Speech and behavior are normal.  ____________________________________________   LABS (all labs ordered are listed, but only abnormal results are displayed)  Labs Reviewed  CBC WITH DIFFERENTIAL/PLATELET - Abnormal; Notable for the following:     RDW 15.1 (*)    All other components within normal limits  BASIC METABOLIC PANEL - Abnormal; Notable for the following:    Glucose, Bld 110 (*)    All other components within normal limits  BLOOD GAS, VENOUS - Abnormal; Notable for the following:    pH, Ven 7.21 (*)    pCO2, Ven 82 (*)  pO2, Ven <31.0 (*)    Bicarbonate 32.8 (*)    All other components within normal limits  TROPONIN I  BRAIN NATRIURETIC PEPTIDE   ____________________________________________  EKG  ED ECG REPORT I, Doran Stabler, the attending physician, personally viewed and interpreted this ECG.   Date: 11/05/2015  EKG Time: 816  Rate: 95  Rhythm: normal sinus rhythm  Axis: Normal axis  Intervals:right bundle branch block and left posterior fascicular block  ST&T Change: No ST segment elevation. T-wave inversion in lead V3. No significant change from EKG of 01/17/2015.  ____________________________________________  RADIOLOGY  DG Chest 1 View (Final result) Result time: 11/05/15 09:05:49   Final result by Rad Results In Interface (11/05/15 09:05:49)   Narrative:   CLINICAL DATA: Presents to emergency department with respiratory distress.  EXAM: CHEST 1 VIEW  COMPARISON: 01/17/2015  FINDINGS: Normal heart size. No pleural effusion or edema. The lungs are hyperinflated with coarsened interstitial markings suggestive of COPD. No superimposed airspace consolidation.  IMPRESSION: 1. Findings compatible with COPD. 2. No evidence for pneumonia.   Electronically Signed By: Kerby Moors M.D. On: 11/05/2015 09:05       ____________________________________________   PROCEDURES  CRITICAL CARE Performed by: Doran Stabler   Total critical care time: 35 minutes  Critical care time was exclusive of separately billable procedures and treating other patients.  Critical care was necessary to treat or prevent imminent or life-threatening deterioration.  Critical care  was time spent personally by me on the following activities: development of treatment plan with patient and/or surrogate as well as nursing, discussions with consultants, evaluation of patient's response to treatment, examination of patient, obtaining history from patient or surrogate, ordering and performing treatments and interventions, ordering and review of laboratory studies, ordering and review of radiographic studies, pulse oximetry and re-evaluation of patient's condition.  ____________________________________________   INITIAL IMPRESSION / ASSESSMENT AND PLAN / ED COURSE  Pertinent labs & imaging results that were available during my care of the patient were reviewed by me and considered in my medical decision making (see chart for details).  ----------------------------------------- 9:15 AM on 11/05/2015 -----------------------------------------  Patient a longer using accessory muscles. Increased air movement. Speaking in full sentences. Looks much improved. Nitropaste removed. Appears much more compatible with COPD. May patient aware of need for permission to the hospital. We'll continue BiPAP and wean as tolerated. Signed out to Dr. Darvin Neighbours. ____________________________________________   FINAL CLINICAL IMPRESSION(S) / ED DIAGNOSES  COPD exacerbation.    NEW MEDICATIONS STARTED DURING THIS VISIT:  New Prescriptions   No medications on file     Note:  This document was prepared using Dragon voice recognition software and may include unintentional dictation errors.    Orbie Pyo, MD 11/05/15 [phone]

## 2015-11-05 NOTE — ED Notes (Signed)
Pt presents via EMS emergency traffic for respiratory distress. Saturation Noted by EMS to be in 60s on home 02. 1 in nitro paste, 2g magnesium, 125 solumedrol, and 3 duo nebs given by EMS. On CPAP upon arrival. MD and RT at bedside upon arrival.

## 2015-11-05 NOTE — ED Notes (Signed)
Nitro paste removed. BP 101/71

## 2015-11-05 NOTE — H&P (Addendum)
[place] at [place] NAME: Michelle [name]    MR#:  [id number]  DATE OF BIRTH:  13-Aug-1960  DATE OF ADMISSION:  11/05/2015  PRIMARY CARE PHYSICIAN: Einar Pheasant, MD   REQUESTING/REFERRING PHYSICIAN: Dr. Lucita Lora  CHIEF COMPLAINT:   Chief Complaint  Patient presents with  . Shortness of Breath    HISTORY OF PRESENT ILLNESS:  Michelle [name]  is a 55 y.o. female with a known history of COPD, CAD, hypertension, tobacco and cocaine abuse presents to the emergency room with worsening shortness of breath. Patient was recently treated at [facility] for COPD exacerbation being on full ventilatory support for 2 days and then BiPAP for 2 days and then nasal cannula. On returning home patient continued to use cocaine and smoking. This caused her breathing to worsen and presented to the emergency room. Here patient was placed on a BiPAP. Chest x-ray shows nothing acute. She is being admitted for acute on chronic respiratory failure due to COPD exacerbation. She has no chest pain or orthopnea or lower extremity edema. No fever at home.  PAST MEDICAL HISTORY:   Past Medical History  Diagnosis Date  . COPD (chronic obstructive pulmonary disease) ([place])   . Emphysema/COPD ([place])   . Hypertension   . High cholesterol   . Pneumothorax   . Coronary artery disease   . Cocaine abuse   . Tobacco abuse     PAST SURGICAL HISTORY:   Past Surgical History  Procedure Laterality Date  . Cardiac catheterization    . Chest tube insertion      SOCIAL HISTORY:   Social History  Substance Use Topics  . Smoking status: Current Every Day Smoker -- 0.50 packs/day    Types: Cigarettes  . Smokeless tobacco: Not on file  . Alcohol Use: No    FAMILY HISTORY:   Family History  Problem Relation Age of Onset  . Lung cancer Mother   . CVA Father   . CAD Father   . Breast cancer Maternal Aunt 67    DRUG ALLERGIES:  No Known Allergies  REVIEW OF SYSTEMS:    Review of Systems  Constitutional: Positive for malaise/fatigue. Negative for fever, chills and weight loss.  HENT: Negative for hearing loss and nosebleeds.   Eyes: Negative for blurred vision, double vision and pain.  Respiratory: Positive for cough, sputum production, shortness of breath and wheezing. Negative for hemoptysis.   Cardiovascular: Negative for chest pain, palpitations, orthopnea and leg swelling.  Gastrointestinal: Negative for nausea, vomiting, abdominal pain, diarrhea and constipation.  Genitourinary: Negative for dysuria and hematuria.  Musculoskeletal: Positive for back pain. Negative for myalgias and falls.  Skin: Negative for rash.  Neurological: Positive for tremors and weakness. Negative for dizziness, sensory change, speech change, focal weakness, seizures and headaches.  Endo/Heme/Allergies: Does not bruise/bleed easily.  Psychiatric/Behavioral: Negative for depression and memory loss. The patient is nervous/anxious.     MEDICATIONS AT HOME:   Prior to Admission medications   Medication Sig Start Date End Date Taking? Authorizing Provider  albuterol (PROVENTIL HFA;VENTOLIN HFA) 108 (90 BASE) MCG/ACT inhaler Inhale 2 puffs into the lungs every 6 (six) hours as needed for wheezing or shortness of breath.   Yes Historical Provider, MD  albuterol (PROVENTIL) (2.5 MG/3ML) 0.083% nebulizer solution Take 2.5 mg by nebulization every 6 (six) hours as needed. For wheezing. 10/24/15 10/23/16 Yes Historical Provider, MD  aspirin EC 81 MG tablet Take 81 mg by mouth daily.   Yes  Historical Provider, MD  carvedilol (COREG) 3.125 MG tablet Take 3.125 mg by mouth every morning. 10/24/15 11/23/15 Yes Historical Provider, MD  enalapril (VASOTEC) 10 MG tablet Take 1 tablet (10 mg total) by mouth daily. Patient taking differently: Take 5 mg by mouth daily.  10/28/14  Yes Nicholes Mango, MD  feeding supplement, ENSURE ENLIVE, (ENSURE ENLIVE) LIQD Take 237 mLs by mouth 2 (two) times daily  between meals. 10/27/14  Yes Nicholes Mango, MD  Fluticasone-Salmeterol (ADVAIR) 250-50 MCG/DOSE AEPB Inhale 1 puff into the lungs 2 (two) times daily.   Yes Historical Provider, MD  guaiFENesin-dextromethorphan (ROBITUSSIN DM) 100-10 MG/5ML syrup Take 10 mLs by mouth every 8 (eight) hours as needed for cough. 10/27/14  Yes Nicholes Mango, MD  simvastatin (ZOCOR) 20 MG tablet Take 20 mg by mouth at bedtime. 10/24/15 11/23/15 Yes Historical Provider, MD  tiotropium (SPIRIVA) 18 MCG inhalation capsule Place 18 mcg into inhaler and inhale daily.   Yes Historical Provider, MD     VITAL SIGNS:  Blood pressure 112/77, pulse 85, temperature 97 F (36.1 C), temperature source Axillary, resp. rate 16, height 5\' 2"  (1.575 m), weight 34.02 kg (75 lb), last menstrual period 03/06/2005, SpO2 95 %.  PHYSICAL EXAMINATION:  Physical Exam  GENERAL:  55 y.o.-year-old patient lying in the bed with Respiratory distress with conversational dyspnea EYES: Pupils equal, round, reactive to light and accommodation. No scleral icterus. Extraocular muscles intact.  HEENT: Head atraumatic, normocephalic. Oropharynx and nasopharynx clear. No oropharyngeal erythema, moist oral mucosa  NECK:  Supple, no jugular venous distention. No thyroid enlargement, no tenderness.  LUNGS: Increased work of breathing with bilateral wheezing. Good air entry. CARDIOVASCULAR: S1, S2 normal. No murmurs, rubs, or gallops.  ABDOMEN: Soft, nontender, nondistended. Bowel sounds present. No organomegaly or mass.  EXTREMITIES: No pedal edema, cyanosis, or clubbing. + 2 pedal & radial pulses b/l.   NEUROLOGIC: Cranial nerves II through XII are intact. No focal Motor or sensory deficits appreciated b/l PSYCHIATRIC: The patient is alert and oriented x 3. Anxious SKIN: No obvious rash, lesion, or ulcer.   LABORATORY PANEL:   CBC  Recent Labs Lab 11/05/15 0827  WBC 7.8  HGB 13.5  HCT 41.5  PLT 253    ------------------------------------------------------------------------------------------------------------------  Chemistries   Recent Labs Lab 11/05/15 0827  NA 140  K 4.0  CL 104  CO2 29  GLUCOSE 110*  BUN 13  CREATININE 0.69  CALCIUM 8.9   ------------------------------------------------------------------------------------------------------------------  Cardiac Enzymes  Recent Labs Lab 11/05/15 0827  TROPONINI <0.03   ------------------------------------------------------------------------------------------------------------------  RADIOLOGY:  Dg Chest 1 View  11/05/2015  CLINICAL DATA:  Presents to emergency department with respiratory distress. EXAM: CHEST 1 VIEW COMPARISON:  01/17/2015 FINDINGS: Normal heart size. No pleural effusion or edema. The lungs are hyperinflated with coarsened interstitial markings suggestive of COPD. No superimposed airspace consolidation. IMPRESSION: 1. Findings compatible with COPD. 2. No evidence for pneumonia. Electronically Signed   By: Kerby Moors M.D.   On: 11/05/2015 09:05     IMPRESSION AND PLAN:   * Acute on chronic respiratory failure due to COPD exacerbation Patient is critically ill on a BiPAP. We'll try to wean off to nasal cannula as possible. -IV steroids, Antibiotics - Scheduled Nebulizers - Inhalers -Wean O2 as tolerated - Consult pulmonary if no improvement  * Hypertension Continue home medications  * CAD stable. No chest pain.  * Cocaine and tobacco abuse. Patient was counseled to quit.  * DVT prophylaxis with Lovenox.  All the records are  reviewed and case discussed with ED provider. Management plans discussed with the patient, family and they are in agreement.  CODE STATUS: FULL CODE  TOTAL CC TIME TAKING CARE OF THIS PATIENT: 40 minutes.   Hillary Bow R M.D on 11/05/2015 at 11:36 AM  Between 7am to 6pm - Pager - [phone]  After 6pm go to [email or web address] - password [email or web address] [facility]  Office  [phone]  CC: Primary care physician; Einar Pheasant, MD  Note: This dictation was prepared with Dragon dictation along with smaller phrase technology. Any transcriptional errors that result from this process are unintentional.

## 2015-11-06 MED ORDER — ENOXAPARIN SODIUM 30 MG/0.3ML ~~LOC~~ SOLN
30.0000 mg | SUBCUTANEOUS | Status: DC
  Administered 2015-11-07: 30 mg via SUBCUTANEOUS
  Filled 2015-11-06: qty 0.3

## 2015-11-06 NOTE — Progress Notes (Signed)
[place] at [place] NAME: Michelle [name]    MR#:  [id number]  DATE OF BIRTH:  08-25-1960  SUBJECTIVE:  CHIEF COMPLAINT:   Chief Complaint  Patient presents with  . Shortness of Breath   Continues to shortness of breath. Off BiPAP. No sputum Sister at bedside contribute to history  REVIEW OF SYSTEMS:    Review of Systems  Constitutional: Positive for malaise/fatigue. Negative for fever and chills.  HENT: Negative for sore throat.   Respiratory: Positive for cough, sputum production and shortness of breath. Negative for hemoptysis and wheezing.   Cardiovascular: Positive for chest pain. Negative for palpitations, orthopnea and leg swelling.  Gastrointestinal: Negative for nausea, vomiting, abdominal pain, diarrhea and constipation.  Musculoskeletal: Negative for back pain and joint pain.  Skin: Negative for rash.  Neurological: Positive for weakness. Negative for focal weakness and headaches.  Endo/Heme/Allergies: Does not bruise/bleed easily.  Psychiatric/Behavioral: Negative for depression. The patient is nervous/anxious.     DRUG ALLERGIES:  No Known Allergies  VITALS:  Blood pressure 107/54, pulse 91, temperature 98.4 F (36.9 C), temperature source Oral, resp. rate 20, height 5\' 2"  (1.575 m), weight 36.061 kg (79 lb 8 oz), last menstrual period 03/06/2005, SpO2 99 %.  PHYSICAL EXAMINATION:   Physical Exam  GENERAL:  55 y.o.-year-old patient lying in the bed with no acute distress.  EYES: Pupils equal, round, reactive to light and accommodation. No scleral icterus. Extraocular muscles intact.  HEENT: Head atraumatic, normocephalic. Oropharynx and nasopharynx clear.  NECK:  Supple, no jugular venous distention. No thyroid enlargement, no tenderness.  LUNGS: Normal breath sounds bilaterally, no wheezing, rales, rhonchi. No use of accessory muscles of respiration.  CARDIOVASCULAR: S1, S2 normal. No murmurs, rubs, or  gallops.  ABDOMEN: Soft, nontender, nondistended. Bowel sounds present. No organomegaly or mass.  EXTREMITIES: No cyanosis, clubbing or edema b/l.    NEUROLOGIC: Cranial nerves II through XII are intact. No focal Motor or sensory deficits b/l.   PSYCHIATRIC: The patient is alert and oriented x 3.  SKIN: No obvious rash, lesion, or ulcer.   LABORATORY PANEL:   CBC  Recent Labs Lab 11/05/15 0827  WBC 7.8  HGB 13.5  HCT 41.5  PLT 253   ------------------------------------------------------------------------------------------------------------------ Chemistries   Recent Labs Lab 11/05/15 0827  NA 140  K 4.0  CL 104  CO2 29  GLUCOSE 110*  BUN 13  CREATININE 0.69  CALCIUM 8.9   ------------------------------------------------------------------------------------------------------------------  Cardiac Enzymes  Recent Labs Lab 11/05/15 0827  TROPONINI <0.03   ------------------------------------------------------------------------------------------------------------------  RADIOLOGY:  Dg Chest 1 View  11/05/2015  CLINICAL DATA:  Presents to emergency department with respiratory distress. EXAM: CHEST 1 VIEW COMPARISON:  01/17/2015 FINDINGS: Normal heart size. No pleural effusion or edema. The lungs are hyperinflated with coarsened interstitial markings suggestive of COPD. No superimposed airspace consolidation. IMPRESSION: 1. Findings compatible with COPD. 2. No evidence for pneumonia. Electronically Signed   By: Kerby Moors M.D.   On: 11/05/2015 09:05     ASSESSMENT AND PLAN:   * Weight loss This is likely due to severe COPD and drug use. Dietary consult. Add ensure.  * Acute on chronic respiratory failure due to COPD exacerbation Baseline improvement -IV steroids, Antibiotics - Scheduled Nebulizers - Inhalers -Wean O2 as tolerated - Consult pulmonary if no improvement  * Hypertension Continue home medications  * CAD stable. No chest pain.  * Cocaine  and tobacco abuse. Patient was counseled to quit.  *  DVT prophylaxis with Lovenox.  All the records are reviewed and case discussed with Care Management/Social Workerr. Management plans discussed with the patient, family and they are in agreement.  CODE STATUS: FULL CODE  DVT Prophylaxis: SCDs  TOTAL TIME TAKING CARE OF THIS PATIENT: 35 minutes.   POSSIBLE D/C IN 1-2 DAYS, DEPENDING ON CLINICAL CONDITION.  Hillary Bow R M.D on 11/06/2015 at 11:56 AM  Between 7am to 6pm - Pager - [phone]  After 6pm go to [email or web address] - password [email or web address] [facility]  Office  [phone]  CC: Primary care physician; Einar Pheasant, MD  Note: This dictation was prepared with Dragon dictation along with smaller phrase technology. Any transcriptional errors that result from this process are unintentional.

## 2015-11-06 NOTE — Progress Notes (Signed)
Anticoagulation monitoring(Lovenox):  55yoF  ordered Lovenox 40 mg Q24h  Filed Weights   11/05/15 0815 11/06/15 0500  Weight: 75 lb (34.02 kg) 79 lb 8 oz (36.061 kg)   BMI 13.7   Weight= 36.1 kg  Lab Results  Component Value Date   CREATININE 0.69 11/05/2015   CREATININE 0.63 10/27/2014   CREATININE 0.70 10/26/2014   Estimated Creatinine Clearance: 45.8 mL/min (by C-G formula based on Cr of 0.69). Hemoglobin & Hematocrit     Component Value Date/Time   HGB 13.5 11/05/2015 0827   HGB 12.4 07/11/2014 0434   HCT 41.5 11/05/2015 0827   HCT 38.9 07/11/2014 0434     Per Protocol for Patient with Low weight and estCrcl> 30 ml/min, will transition to Lovenox 30 mg Q24h     Chinita Greenland PharmD Clinical Pharmacist 11/06/2015

## 2015-11-07 ENCOUNTER — Encounter: Payer: Self-pay | Admitting: Pharmacist

## 2015-11-07 ENCOUNTER — Telehealth: Payer: Self-pay | Admitting: Internal Medicine

## 2015-11-07 LAB — BLOOD GAS, VENOUS
Acid-Base Excess: 2.4 mmol/L (ref 0.0–3.0)
Bicarbonate: 29.9 mEq/L — ABNORMAL HIGH (ref 21.0–28.0)
FIO2: 0.32
PATIENT TEMPERATURE: 37
pCO2, Ven: 58 mmHg (ref 44.0–60.0)
pH, Ven: 7.32 (ref 7.320–7.430)

## 2015-11-07 MED ORDER — DOXYCYCLINE HYCLATE 100 MG PO TABS: 100.0000 mg | ORAL_TABLET | Freq: Two times a day (BID) | ORAL | Status: DC

## 2015-11-07 MED ORDER — ALBUTEROL SULFATE HFA 108 (90 BASE) MCG/ACT IN AERS: 2.0000 | INHALATION_SPRAY | Freq: Four times a day (QID) | RESPIRATORY_TRACT | Status: DC | PRN

## 2015-11-07 MED ORDER — LORAZEPAM 0.5 MG PO TABS: 0.5000 mg | ORAL_TABLET | Freq: Two times a day (BID) | ORAL | Status: DC | PRN

## 2015-11-07 MED ORDER — PREDNISONE 20 MG PO TABS: 20.0000 mg | ORAL_TABLET | Freq: Every day | ORAL | Status: DC

## 2015-11-07 MED ORDER — IPRATROPIUM-ALBUTEROL 0.5-2.5 (3) MG/3ML IN SOLN: 3.0000 mL | RESPIRATORY_TRACT | Status: DC | PRN

## 2015-11-07 NOTE — Progress Notes (Signed)
Pt to be discharged today. Iv and tele removed. disch instructions and prescrips given to pt. Discharged via w.c. On 2 l 02 accompanied by family member.

## 2015-11-07 NOTE — Care Management (Signed)
Patient admitted with exac of copd.  She reports [facility].  She has chronic home 02 and "doesn't have a clue who provided it."  Discussed the need to have her portable tank brought to the hospital for transport home.  She lives with the father of her children and says the address on the face sheet is correct.  Says she does not get good cell phone service in that area so can also call her daughter Ciarra [phone].  She is followed by [facility] and is current.  She [facility] her medications [facility].  Faxed her scripts and informed primary nurse and patient.  Patient says has started a disability application.  She is high risk for readmission.  Obtained  approval for HRI through [facility] for SN and SW.  Patient is asking for smaller portable 02 tank.  Discussed it would be impossible to request if do not know the name of the agency.  [facility] is on [place] call this week and provided heads up referral.  Requested home health order for SN and SW

## 2015-11-07 NOTE — Discharge Instructions (Signed)
°  DIET:  °Cardiac diet ° °DISCHARGE CONDITION:  °Stable ° °ACTIVITY:  °Activity as tolerated ° °OXYGEN:  °Home Oxygen: Yes.   °  °Oxygen Delivery: 2 liters/min via Patient connected to nasal cannula oxygen ° °DISCHARGE LOCATION:  °home  ° °If you experience worsening of your admission symptoms, develop shortness of breath, life threatening emergency, suicidal or homicidal thoughts you must seek medical attention immediately by calling 911 or calling your MD immediately  if symptoms less severe. ° °You Must read complete instructions/literature along with all the possible adverse reactions/side effects for all the Medicines you take and that have been prescribed to you. Take any new Medicines after you have completely understood and accpet all the possible adverse reactions/side effects.  ° °Please note ° °You were cared for by a hospitalist during your hospital stay. If you have any questions about your discharge medications or the care you received while you were in the hospital after you are discharged, you can call the unit and asked to speak with the hospitalist on call if the hospitalist that took care of you is not available. Once you are discharged, your primary care physician will handle any further medical issues. Please note that NO REFILLS for any discharge medications will be authorized once you are discharged, as it is imperative that you return to your primary care physician (or establish a relationship with a primary care physician if you do not have one) for your aftercare needs so that they can reassess your need for medications and monitor your lab values. ° ° ° °

## 2015-11-07 NOTE — Telephone Encounter (Signed)
Thank you.  Will check the schedule and continue to follow as appropriate.

## 2015-11-07 NOTE — Telephone Encounter (Signed)
FYI, Pt is being discharged today from the hospital. Dx was COPD exacerbation. Pt needs a appt in 1 week. No appt avail to sch. Let me know where to sch. Thank you!

## 2015-11-07 NOTE — Progress Notes (Signed)
Pt very dyspneic on exertion.. Pt unable to walk without 02. ssats 100% on 2 litres at rest.  Drops to 88 walking with 02 2 l.

## 2015-11-09 NOTE — Discharge Summary (Signed)
[place] [facility] [place] NAME: Michelle [name]    MR#:  [id number]  DATE OF BIRTH:  1961-04-02  DATE OF ADMISSION:  11/05/2015 ADMITTING PHYSICIAN: Hillary Bow, MD  DATE OF DISCHARGE: 11/07/2015  1:05 PM  PRIMARY CARE PHYSICIAN: Einar Pheasant, MD   ADMISSION DIAGNOSIS:  COPD exacerbation ([place]) [J44.1]  DISCHARGE DIAGNOSIS:  Active Problems:   COPD exacerbation ([place])   SECONDARY DIAGNOSIS:   Past Medical History  Diagnosis Date  . COPD (chronic obstructive pulmonary disease) ([place])   . Emphysema/COPD ([place])   . Hypertension   . High cholesterol   . Pneumothorax   . Coronary artery disease   . Cocaine abuse   . Tobacco abuse      ADMITTING HISTORY  Michelle [name] is a 55 y.o. female with a known history of COPD, CAD, hypertension, tobacco and cocaine abuse presents to the emergency room with worsening shortness of breath. Patient was recently treated at [facility] for COPD exacerbation being on full ventilatory support for 2 days and then BiPAP for 2 days and then nasal cannula. On returning home patient continued to use cocaine and smoking. This caused her breathing to worsen and presented to the emergency room. Here patient was placed on a BiPAP. Chest x-ray shows nothing acute. She is being admitted for acute on chronic respiratory failure due to COPD exacerbation. She has no chest pain or orthopnea or lower extremity edema. No fever at home.  HOSPITAL COURSE:    * Acute on chronic respiratory failure due to COPD exacerbation due to on going smoking -IV steroids, Antibiotics - Scheduled Nebulizers - Inhalers  Improved and getting close to baseline. Switch to prednisone and PO abx with nebs. Counseled to quit smoking  * Hypertension Continue home medications  * CAD stable. No chest pain.  * Cocaine and tobacco abuse. Patient was counseled to quit.  * Weight loss This is likely due to severe COPD and drug use. Dietary  consult. Add ensure.  * DVT prophylaxis with Lovenox  CONSULTS OBTAINED:     DRUG ALLERGIES:  No Known Allergies  DISCHARGE MEDICATIONS:   Discharge Medication List as of 11/07/2015 12:35 PM    START taking these medications   Details  doxycycline (VIBRA-TABS) 100 MG tablet Take 1 tablet (100 mg total) by mouth every 12 (twelve) hours., Starting 11/07/2015, Until Discontinued, Print    ipratropium-albuterol (DUONEB) 0.5-2.5 (3) MG/3ML SOLN Take 3 mLs by nebulization every 4 (four) hours as needed., Starting 11/07/2015, Until Discontinued, Print    LORazepam (ATIVAN) 0.5 MG tablet Take 1 tablet (0.5 mg total) by mouth 2 (two) times daily as needed for anxiety., Starting 11/07/2015, Until Discontinued, Print    predniSONE (DELTASONE) 20 MG tablet Take 1 tablet (20 mg total) by mouth daily with breakfast., Starting 11/07/2015, Until Discontinued, Print      CONTINUE these medications which have CHANGED   Details  albuterol (PROVENTIL HFA;VENTOLIN HFA) 108 (90 Base) MCG/ACT inhaler Inhale 2 puffs into the lungs every 6 (six) hours as needed for wheezing or shortness of breath., Starting 11/07/2015, Until Discontinued, Normal      CONTINUE these medications which have NOT CHANGED   Details  aspirin EC 81 MG tablet Take 81 mg by mouth daily., Until Discontinued, Historical Med    carvedilol (COREG) 3.125 MG tablet Take 3.125 mg by mouth every morning., Starting 10/24/2015, Until Wed 11/23/15, Historical Med    enalapril (VASOTEC) 10 MG tablet Take 1 tablet (10  mg total) by mouth daily., Starting 10/28/2014, Until Discontinued, Print    feeding supplement, ENSURE ENLIVE, (ENSURE ENLIVE) LIQD Take 237 mLs by mouth 2 (two) times daily between meals., Starting 10/27/2014, Until Discontinued, OTC    Fluticasone-Salmeterol (ADVAIR) 250-50 MCG/DOSE AEPB Inhale 1 puff into the lungs 2 (two) times daily., Until Discontinued, Historical Med    guaiFENesin-dextromethorphan (ROBITUSSIN DM) 100-10 MG/5ML  syrup Take 10 mLs by mouth every 8 (eight) hours as needed for cough., Starting 10/27/2014, Until Discontinued, OTC    simvastatin (ZOCOR) 20 MG tablet Take 20 mg by mouth at bedtime., Starting 10/24/2015, Until Wed 11/23/15, Historical Med    tiotropium (SPIRIVA) 18 MCG inhalation capsule Place 18 mcg into inhaler and inhale daily., Until Discontinued, Historical Med      STOP taking these medications     albuterol (PROVENTIL) (2.5 MG/3ML) 0.083% nebulizer solution         Today   VITAL SIGNS:  Blood pressure 113/65, pulse 89, temperature 97.8 F (36.6 C), temperature source Oral, resp. rate 20, height 5\' 2"  (1.575 m), weight 35.82 kg (78 lb 15.5 oz), last menstrual period 03/06/2005, SpO2 99 %.  I/O:  No intake or output data in the 24 hours ending 11/09/15 1955  PHYSICAL EXAMINATION:  Physical Exam  GENERAL:  55 y.o.-year-old patient lying in the bed with some SOB LUNGS: Normal breath sounds bilaterally, no wheezing, rales,rhonchi or crepitation. No use of accessory muscles of respiration.  CARDIOVASCULAR: S1, S2 normal. No murmurs, rubs, or gallops.  ABDOMEN: Soft, non-tender, non-distended. Bowel sounds present. No organomegaly or mass.  NEUROLOGIC: Moves all 4 extremities. PSYCHIATRIC: The patient is alert and oriented x 3.  SKIN: No obvious rash, lesion, or ulcer.   DATA REVIEW:   CBC  Recent Labs Lab 11/05/15 0827  WBC 7.8  HGB 13.5  HCT 41.5  PLT 253    Chemistries   Recent Labs Lab 11/05/15 0827  NA 140  K 4.0  CL 104  CO2 29  GLUCOSE 110*  BUN 13  CREATININE 0.69  CALCIUM 8.9    Cardiac Enzymes  Recent Labs Lab 11/05/15 0827  TROPONINI <0.03    Microbiology Results  Results for orders placed or performed during the hospital encounter of 10/24/14  Culture, blood (routine x 2)     Status: None   Collection Time: 10/24/14 10:30 AM  Result Value Ref Range Status   Specimen Description BLOOD  Final   Special Requests NONE  Final    Culture NO GROWTH 5 DAYS  Final   Report Status 10/29/2014 FINAL  Final  Culture, blood (routine x 2)     Status: None   Collection Time: 10/24/14 10:31 AM  Result Value Ref Range Status   Specimen Description BLOOD  Final   Special Requests NONE  Final   Culture NO GROWTH 5 DAYS  Final   Report Status 10/29/2014 FINAL  Final  Culture, sputum-assessment     Status: None   Collection Time: 10/25/14  6:25 AM  Result Value Ref Range Status   Specimen Description SPUTUM  Final   Special Requests NONE  Final   Sputum evaluation THIS SPECIMEN IS ACCEPTABLE FOR SPUTUM CULTURE  Final   Report Status 10/25/2014 FINAL  Final  Culture, respiratory (NON-Expectorated)     Status: None   Collection Time: 10/25/14  6:25 AM  Result Value Ref Range Status   Specimen Description SPUTUM  Final   Special Requests NONE Reflexed from [id number]  Final  Gram Stain   Final    MANY WBC SEEN FEW GRAM POSITIVE COCCI IN CLUSTERS IN PAIRS EXCELLENT SPECIMEN - 90-100% WBCS    Culture MODERATE GROWTH STREPTOCOCCUS PNEUMONIAE  Final   Report Status 10/28/2014 FINAL  Final   Organism ID, Bacteria STREPTOCOCCUS PNEUMONIAE  Final      Susceptibility   Streptococcus pneumoniae - MIC (ETEST)*    PENICILLIN Value in next row Sensitive      SENSITIVE0.50    CEFTRIAXONE Value in next row Sensitive      SENSITIVE0.125    LEVOFLOXACIN Value in next row Sensitive      SENSITIVE1.0    ERYTHROMYCIN Value in next row Resistant      RESISTANT8.0    VANCOMYCIN Value in next row Sensitive      SENSITIVE0.50    OXACILLIN Value in next row Resistant      SENSITIVE0.50    * MODERATE GROWTH STREPTOCOCCUS PNEUMONIAE    RADIOLOGY:  No results found.  Follow up with PCP in 1 week.  Management plans discussed with the patient, family and they are in agreement.  CODE STATUS:  Code Status History    Date Active Date Inactive Code Status Order ID Comments User Context   11/05/2015  9:11 AM 11/07/2015  4:06 PM Full Code  [id number]  Hillary Bow, MD ED   10/24/2014  3:22 PM 10/27/2014  4:30 PM Full Code [id number]  Hillary Bow, MD Inpatient      TOTAL TIME TAKING CARE OF THIS PATIENT ON DAY OF DISCHARGE: more than 30 minutes.   Hillary Bow R M.D on 11/09/2015 at 7:55 PM  Between 7am to 6pm - Pager - [phone]  After 6pm go to [email or web address] - password [email or web address] [facility]  Office  [phone]  CC: Primary care physician; Einar Pheasant, MD  Note: This dictation was prepared with Dragon dictation along with smaller phrase technology. Any transcriptional errors that result from this process are unintentional.

## 2015-11-29 ENCOUNTER — Other Ambulatory Visit: Payer: Self-pay

## 2015-11-29 ENCOUNTER — Emergency Department: Payer: Medicaid Other

## 2015-11-29 ENCOUNTER — Encounter: Payer: Self-pay | Admitting: Emergency Medicine

## 2015-11-29 ENCOUNTER — Inpatient Hospital Stay
Admission: EM | Admit: 2015-11-29 | Discharge: 2015-12-01 | DRG: 190 | Disposition: A | Discharge status: Home / Self Care | Payer: Medicaid Other | Attending: Internal Medicine | Admitting: Internal Medicine

## 2015-11-29 DIAGNOSIS — J9621 Acute and chronic respiratory failure with hypoxia: Secondary | ICD-10-CM | POA: Diagnosis present

## 2015-11-29 DIAGNOSIS — Z681 Body mass index (BMI) 19 or less, adult: Secondary | ICD-10-CM

## 2015-11-29 DIAGNOSIS — I509 Heart failure, unspecified: Secondary | ICD-10-CM | POA: Diagnosis present

## 2015-11-29 DIAGNOSIS — I110 Hypertensive heart disease with heart failure: Secondary | ICD-10-CM | POA: Diagnosis present

## 2015-11-29 DIAGNOSIS — J440 Chronic obstructive pulmonary disease with acute lower respiratory infection: Principal | ICD-10-CM | POA: Diagnosis present

## 2015-11-29 DIAGNOSIS — J9622 Acute and chronic respiratory failure with hypercapnia: Secondary | ICD-10-CM | POA: Diagnosis present

## 2015-11-29 DIAGNOSIS — E785 Hyperlipidemia, unspecified: Secondary | ICD-10-CM | POA: Diagnosis present

## 2015-11-29 DIAGNOSIS — Z801 Family history of malignant neoplasm of trachea, bronchus and lung: Secondary | ICD-10-CM | POA: Diagnosis not present

## 2015-11-29 DIAGNOSIS — Z8249 Family history of ischemic heart disease and other diseases of the circulatory system: Secondary | ICD-10-CM | POA: Diagnosis not present

## 2015-11-29 DIAGNOSIS — F17210 Nicotine dependence, cigarettes, uncomplicated: Secondary | ICD-10-CM | POA: Diagnosis present

## 2015-11-29 DIAGNOSIS — J441 Chronic obstructive pulmonary disease with (acute) exacerbation: Secondary | ICD-10-CM | POA: Diagnosis present

## 2015-11-29 DIAGNOSIS — E872 Acidosis: Secondary | ICD-10-CM | POA: Diagnosis present

## 2015-11-29 DIAGNOSIS — I2510 Atherosclerotic heart disease of native coronary artery without angina pectoris: Secondary | ICD-10-CM | POA: Diagnosis present

## 2015-11-29 DIAGNOSIS — R0781 Pleurodynia: Secondary | ICD-10-CM

## 2015-11-29 DIAGNOSIS — Z79899 Other long term (current) drug therapy: Secondary | ICD-10-CM

## 2015-11-29 DIAGNOSIS — Z823 Family history of stroke: Secondary | ICD-10-CM | POA: Diagnosis not present

## 2015-11-29 DIAGNOSIS — E46 Unspecified protein-calorie malnutrition: Secondary | ICD-10-CM | POA: Diagnosis present

## 2015-11-29 DIAGNOSIS — J189 Pneumonia, unspecified organism: Secondary | ICD-10-CM | POA: Diagnosis present

## 2015-11-29 DIAGNOSIS — Z7951 Long term (current) use of inhaled steroids: Secondary | ICD-10-CM

## 2015-11-29 DIAGNOSIS — Z7982 Long term (current) use of aspirin: Secondary | ICD-10-CM | POA: Diagnosis not present

## 2015-11-29 DIAGNOSIS — J209 Acute bronchitis, unspecified: Secondary | ICD-10-CM | POA: Diagnosis present

## 2015-11-29 DIAGNOSIS — Z803 Family history of malignant neoplasm of breast: Secondary | ICD-10-CM

## 2015-11-29 DIAGNOSIS — J9602 Acute respiratory failure with hypercapnia: Secondary | ICD-10-CM

## 2015-11-29 DIAGNOSIS — J9601 Acute respiratory failure with hypoxia: Secondary | ICD-10-CM

## 2015-11-29 HISTORY — DX: Unspecified asthma, uncomplicated: J45.909

## 2015-11-29 HISTORY — DX: Heart failure, unspecified: I50.9

## 2015-11-29 LAB — CBC WITH DIFFERENTIAL/PLATELET
BASOS ABS: 0 10*3/uL (ref 0–0.1)
Eosinophils Absolute: 0 10*3/uL (ref 0–0.7)
HCT: 41 % (ref 35.0–47.0)
Hemoglobin: 13.5 g/dL (ref 12.0–16.0)
Lymphs Abs: 1.1 10*3/uL (ref 1.0–3.6)
MCH: 29.9 pg (ref 26.0–34.0)
MCHC: 33 g/dL (ref 32.0–36.0)
MCV: 90.5 fL (ref 80.0–100.0)
MONO ABS: 0.8 10*3/uL (ref 0.2–0.9)
Monocytes Relative: 8 %
Neutro Abs: 8.1 10*3/uL — ABNORMAL HIGH (ref 1.4–6.5)
Neutrophils Relative %: 81 %
PLATELETS: 200 10*3/uL (ref 150–440)
RBC: 4.53 MIL/uL (ref 3.80–5.20)
RDW: 14.4 % (ref 11.5–14.5)
WBC: 10 10*3/uL (ref 3.6–11.0)

## 2015-11-29 LAB — BLOOD GAS, ARTERIAL
Acid-base deficit: 2.4 mmol/L — ABNORMAL HIGH (ref 0.0–2.0)
Bicarbonate: 24.2 mEq/L (ref 21.0–28.0)
Expiratory PAP: 6
FIO2: 0.4
Inspiratory PAP: 14
O2 Saturation: 99.1 %
Patient temperature: 37
pCO2 arterial: 48 mmHg (ref 32.0–48.0)
pH, Arterial: 7.31 — ABNORMAL LOW (ref 7.350–7.450)
pO2, Arterial: 148 mmHg — ABNORMAL HIGH (ref 83.0–108.0)

## 2015-11-29 LAB — TSH: TSH: 0.668 u[IU]/mL (ref 0.350–4.500)

## 2015-11-29 LAB — BASIC METABOLIC PANEL
Anion gap: 8 (ref 5–15)
BUN: 15 mg/dL (ref 6–20)
CALCIUM: 9 mg/dL (ref 8.9–10.3)
CO2: 28 mmol/L (ref 22–32)
Chloride: 104 mmol/L (ref 101–111)
Creatinine, Ser: 0.91 mg/dL (ref 0.44–1.00)
GFR calc Af Amer: >60 mL/min (ref >60)
GLUCOSE: 151 mg/dL — AB (ref 65–99)
Potassium: 4.7 mmol/L (ref 3.5–5.1)
Sodium: 140 mmol/L (ref 135–145)

## 2015-11-29 LAB — HEMOGLOBIN A1C: Hgb A1c MFr Bld: 5.6 % (ref 4.0–6.0)

## 2015-11-29 MED ORDER — KETOROLAC TROMETHAMINE 30 MG/ML IJ SOLN
15.0000 mg | INTRAMUSCULAR | Status: AC
  Administered 2015-11-29: 15 mg via INTRAVENOUS
  Filled 2015-11-29: qty 1

## 2015-11-29 MED ORDER — SIMVASTATIN 20 MG PO TABS
20.0000 mg | ORAL_TABLET | Freq: Every day | ORAL | Status: DC
  Administered 2015-11-29 – 2015-11-30 (×2): 20 mg via ORAL
  Filled 2015-11-29 (×2): qty 1

## 2015-11-29 MED ORDER — IPRATROPIUM-ALBUTEROL 0.5-2.5 (3) MG/3ML IN SOLN
RESPIRATORY_TRACT | Status: AC
  Administered 2015-11-29: 6 mL via RESPIRATORY_TRACT
  Filled 2015-11-29: qty 6

## 2015-11-29 MED ORDER — TIOTROPIUM BROMIDE MONOHYDRATE 18 MCG IN CAPS
18.0000 ug | ORAL_CAPSULE | Freq: Every day | RESPIRATORY_TRACT | Status: DC
  Administered 2015-11-30 – 2015-12-01 (×2): 18 ug via RESPIRATORY_TRACT
  Filled 2015-11-29: qty 5

## 2015-11-29 MED ORDER — ALBUTEROL SULFATE (2.5 MG/3ML) 0.083% IN NEBU
2.5000 mg | INHALATION_SOLUTION | RESPIRATORY_TRACT | Status: DC
  Administered 2015-11-29 (×5): 2.5 mg via RESPIRATORY_TRACT
  Filled 2015-11-29 (×5): qty 3

## 2015-11-29 MED ORDER — IPRATROPIUM-ALBUTEROL 0.5-2.5 (3) MG/3ML IN SOLN
6.0000 mL | Freq: Once | RESPIRATORY_TRACT | Status: AC
  Administered 2015-11-29: 6 mL via RESPIRATORY_TRACT

## 2015-11-29 MED ORDER — ENOXAPARIN SODIUM 40 MG/0.4ML ~~LOC~~ SOLN
40.0000 mg | SUBCUTANEOUS | Status: DC
  Administered 2015-11-29: 40 mg via SUBCUTANEOUS
  Filled 2015-11-29: qty 0.4

## 2015-11-29 MED ORDER — ENSURE ENLIVE PO LIQD
237.0000 mL | Freq: Two times a day (BID) | ORAL | Status: DC
  Administered 2015-11-29 – 2015-11-30 (×4): 237 mL via ORAL

## 2015-11-29 MED ORDER — ONDANSETRON HCL 4 MG/2ML IJ SOLN: 4.0000 mg | Freq: Four times a day (QID) | INTRAMUSCULAR | Status: DC | PRN

## 2015-11-29 MED ORDER — LEVOFLOXACIN 500 MG PO TABS: 500.0000 mg | ORAL_TABLET | Freq: Every day | ORAL | Status: DC

## 2015-11-29 MED ORDER — DOCUSATE SODIUM 100 MG PO CAPS
100.0000 mg | ORAL_CAPSULE | Freq: Two times a day (BID) | ORAL | Status: DC
Start: 2015-11-29 — End: 2015-12-01
  Administered 2015-11-29 – 2015-12-01 (×3): 100 mg via ORAL
  Filled 2015-11-29 (×5): qty 1

## 2015-11-29 MED ORDER — ENALAPRIL MALEATE 10 MG PO TABS
10.0000 mg | ORAL_TABLET | Freq: Every day | ORAL | Status: DC
  Administered 2015-11-30 – 2015-12-01 (×2): 10 mg via ORAL
  Filled 2015-11-29 (×3): qty 1

## 2015-11-29 MED ORDER — PREDNISONE 20 MG PO TABS: 20.0000 mg | ORAL_TABLET | Freq: Every day | ORAL | Status: DC

## 2015-11-29 MED ORDER — ALBUTEROL SULFATE (2.5 MG/3ML) 0.083% IN NEBU
5.0000 mg | INHALATION_SOLUTION | Freq: Once | RESPIRATORY_TRACT | Status: AC
Start: 2015-11-29 — End: 2015-11-29
  Administered 2015-11-29: 5 mg via RESPIRATORY_TRACT
  Filled 2015-11-29: qty 6

## 2015-11-29 MED ORDER — ACETAMINOPHEN 325 MG PO TABS
650.0000 mg | ORAL_TABLET | Freq: Four times a day (QID) | ORAL | Status: DC | PRN
  Administered 2015-11-29 – 2015-11-30 (×2): 650 mg via ORAL
  Filled 2015-11-29 (×2): qty 2

## 2015-11-29 MED ORDER — CARVEDILOL 3.125 MG PO TABS
3.1250 mg | ORAL_TABLET | ORAL | Status: DC
  Administered 2015-11-29 – 2015-12-01 (×3): 3.125 mg via ORAL
  Filled 2015-11-29 (×3): qty 1

## 2015-11-29 MED ORDER — ACETAMINOPHEN 650 MG RE SUPP: 650.0000 mg | Freq: Four times a day (QID) | RECTAL | Status: DC | PRN

## 2015-11-29 MED ORDER — MORPHINE SULFATE (PF) 2 MG/ML IV SOLN
2.0000 mg | INTRAVENOUS | Status: DC | PRN
  Administered 2015-11-29: 2 mg via INTRAVENOUS
  Filled 2015-11-29: qty 1

## 2015-11-29 MED ORDER — LORAZEPAM 0.5 MG PO TABS
0.5000 mg | ORAL_TABLET | Freq: Two times a day (BID) | ORAL | Status: DC | PRN
  Administered 2015-11-29 – 2015-11-30 (×2): 0.5 mg via ORAL
  Filled 2015-11-29 (×2): qty 1

## 2015-11-29 MED ORDER — LEVOFLOXACIN 500 MG PO TABS
500.0000 mg | ORAL_TABLET | Freq: Once | ORAL | Status: AC
  Administered 2015-11-29: 500 mg via ORAL
  Filled 2015-11-29: qty 1

## 2015-11-29 MED ORDER — PREDNISONE 10 MG PO TABS: 10.0000 mg | ORAL_TABLET | Freq: Every day | ORAL | Status: DC

## 2015-11-29 MED ORDER — SODIUM CHLORIDE 0.9 % IV BOLUS (SEPSIS)
1000.0000 mL | Freq: Once | INTRAVENOUS | Status: AC
  Administered 2015-11-29: 1000 mL via INTRAVENOUS

## 2015-11-29 MED ORDER — ONDANSETRON HCL 4 MG PO TABS: 4.0000 mg | ORAL_TABLET | Freq: Four times a day (QID) | ORAL | Status: DC | PRN

## 2015-11-29 MED ORDER — LEVOFLOXACIN 500 MG PO TABS
250.0000 mg | ORAL_TABLET | Freq: Every day | ORAL | Status: DC
  Administered 2015-11-29 – 2015-11-30 (×2): 250 mg via ORAL
  Filled 2015-11-29 (×2): qty 1

## 2015-11-29 MED ORDER — MOMETASONE FURO-FORMOTEROL FUM 200-5 MCG/ACT IN AERO
2.0000 | INHALATION_SPRAY | Freq: Two times a day (BID) | RESPIRATORY_TRACT | Status: DC
  Administered 2015-11-29 – 2015-12-01 (×5): 2 via RESPIRATORY_TRACT
  Filled 2015-11-29: qty 8.8

## 2015-11-29 MED ORDER — PREDNISONE 50 MG PO TABS
50.0000 mg | ORAL_TABLET | Freq: Every day | ORAL | Status: AC
  Administered 2015-11-29: 50 mg via ORAL
  Filled 2015-11-29: qty 1

## 2015-11-29 MED ORDER — ASPIRIN EC 81 MG PO TBEC
81.0000 mg | DELAYED_RELEASE_TABLET | Freq: Every day | ORAL | Status: DC
  Administered 2015-11-29 – 2015-12-01 (×3): 81 mg via ORAL
  Filled 2015-11-29 (×3): qty 1

## 2015-11-29 MED ORDER — GUAIFENESIN-DM 100-10 MG/5ML PO SYRP
10.0000 mL | ORAL_SOLUTION | Freq: Three times a day (TID) | ORAL | Status: DC | PRN
  Administered 2015-11-29 – 2015-11-30 (×2): 10 mL via ORAL
  Filled 2015-11-29 (×2): qty 10

## 2015-11-29 MED ORDER — PREDNISONE 10 MG PO TABS: 30.0000 mg | ORAL_TABLET | Freq: Every day | ORAL | Status: DC

## 2015-11-29 MED ORDER — PREDNISONE 20 MG PO TABS
40.0000 mg | ORAL_TABLET | Freq: Every day | ORAL | Status: AC
  Administered 2015-11-30: 40 mg via ORAL
  Filled 2015-11-29: qty 2

## 2015-11-29 NOTE — ED Notes (Signed)
PT to rm 6 via EMS from home.  PT c/o SOB x 1 day, hx asthma and copd.  EMS tx with CPAP, now 4L [address].  2 albuterol tx given, 125 solumedrol, 1g magnesium given.  Pt on home o2.

## 2015-11-29 NOTE — ED Provider Notes (Signed)
[facility] Emergency Department Provider Note  ____________________________________________  Time seen: 1:15 AM  I have reviewed the triage vital signs and the nursing notes.   HISTORY  Chief Complaint Shortness of Breath  Level 5 caveat:  Portions of the history and physical were unable to be obtained due to the patient's acute illness   HPI Michelle [name] is a 55 y.o. female comes ED complaining of shortness of breath 24 hours. Gradual onset associated with productive cough. No fever chills or sweats. Tried her inhalers at home without relief. Unable to identify any inciting event. No chest pain. No dizziness or syncope.  SK 125 Solu-Medrol, albuterol 2, 1 g magnesium IV en route. Patient does use nasal cannula oxygen 1 L at rest at home.     Past Medical History  Diagnosis Date  . COPD (chronic obstructive pulmonary disease) ([place])   . Emphysema/COPD ([place])   . Hypertension   . High cholesterol   . Pneumothorax   . Coronary artery disease   . Cocaine abuse   . Tobacco abuse      Patient Active Problem List   Diagnosis Date Noted  . Malnutrition of moderate degree ([place]) 10/25/2014  . COPD exacerbation ([place]) 10/24/2014  . Acute respiratory failure with hypoxia ([place]) 10/24/2014  . Tobacco abuse 10/24/2014  . Community acquired pneumonia 10/24/2014     Past Surgical History  Procedure Laterality Date  . Cardiac catheterization    . Chest tube insertion       Current Outpatient Rx  Name  Route  Sig  Dispense  Refill  . albuterol (PROVENTIL HFA;VENTOLIN HFA) 108 (90 Base) MCG/ACT inhaler   Inhalation   Inhale 2 puffs into the lungs every 6 (six) hours as needed for wheezing or shortness of breath.   18 g   0   . aspirin EC 81 MG tablet   Oral   Take 81 mg by mouth daily.         . carvedilol (COREG) 3.125 MG tablet   Oral   Take 3.125 mg by mouth every morning.         . enalapril (VASOTEC) 10 MG tablet   Oral   Take 1  tablet (10 mg total) by mouth daily. Patient taking differently: Take 5 mg by mouth daily.    30 tablet   0   . feeding supplement, ENSURE ENLIVE, (ENSURE ENLIVE) LIQD   Oral   Take 237 mLs by mouth 2 (two) times daily between meals.   237 mL   12   . Fluticasone-Salmeterol (ADVAIR) 250-50 MCG/DOSE AEPB   Inhalation   Inhale 1 puff into the lungs 2 (two) times daily.         Marland Kitchen guaiFENesin-dextromethorphan (ROBITUSSIN DM) 100-10 MG/5ML syrup   Oral   Take 10 mLs by mouth every 8 (eight) hours as needed for cough.   118 mL   0   . ipratropium-albuterol (DUONEB) 0.5-2.5 (3) MG/3ML SOLN   Nebulization   Take 3 mLs by nebulization every 4 (four) hours as needed.   360 mL   0   . LORazepam (ATIVAN) 0.5 MG tablet   Oral   Take 1 tablet (0.5 mg total) by mouth 2 (two) times daily as needed for anxiety.   20 tablet   0   . simvastatin (ZOCOR) 20 MG tablet   Oral   Take 20 mg by mouth at bedtime.         Marland Kitchen tiotropium (  SPIRIVA) 18 MCG inhalation capsule   Inhalation   Place 18 mcg into inhaler and inhale daily.         Marland Kitchen doxycycline (VIBRA-TABS) 100 MG tablet   Oral   Take 1 tablet (100 mg total) by mouth every 12 (twelve) hours.   10 tablet   0   . predniSONE (DELTASONE) 20 MG tablet   Oral   Take 1 tablet (20 mg total) by mouth daily with breakfast.   9 tablet   0     40mg  x 3 days and then 20 mg x 3 days. Then STOP      Allergies Review of patient's allergies indicates no known allergies.   Family History  Problem Relation Age of Onset  . Lung cancer Mother   . CVA Father   . CAD Father   . Breast cancer Maternal Aunt 67    Social History Social History  Substance Use Topics  . Smoking status: Current Every Day Smoker -- 0.50 packs/day    Types: Cigarettes  . Smokeless tobacco: None  . Alcohol Use: No    Review of Systems  Constitutional:   No fever or chills.  ENT:   No sore throat. No rhinorrhea. Cardiovascular:   No chest  pain. Respiratory:   Positive shortness of breath and cough. Gastrointestinal:   Negative for abdominal pain, vomiting and diarrhea.   Musculoskeletal:   Negative for focal pain or swelling 10-point ROS otherwise negative.  ____________________________________________   PHYSICAL EXAM:  VITAL SIGNS: ED Triage Vitals  Enc Vitals Group     BP 11/29/15 0115 161/119 mmHg     Pulse Rate 11/29/15 0115 95     Resp 11/29/15 0115 26     Temp 11/29/15 0115 98.5 F (36.9 C)     Temp Source 11/29/15 0115 Oral     SpO2 11/29/15 0115 96 %     Weight 11/29/15 0115 79 lb (35.834 kg)     Height 11/29/15 0115 4\' 9"  (1.448 m)     Head Cir --      Peak Flow --      Pain Score 11/29/15 0115 8     Pain Loc --      Pain Edu? --      Excl. in [place]? --     Vital signs reviewed, nursing assessments reviewed.   Constitutional:   Alert and oriented. Moderate respiratory distress. Eyes:   No scleral icterus. No conjunctival pallor. PERRL. EOMI.  No nystagmus. ENT   Head:   Normocephalic and atraumatic.   Nose:   No congestion/rhinnorhea. No septal hematoma   Mouth/Throat:   Dry mucous membranes, no pharyngeal erythema. No peritonsillar mass.    Neck:   No stridor. No SubQ emphysema. No meningismus. Hematological/Lymphatic/Immunilogical:   No cervical lymphadenopathy. Cardiovascular:   RRR. Symmetric bilateral radial and DP pulses.  No murmurs.  Respiratory:   Tachypnea, increased work of breathing, poor air entry in all lung fields, V6 respiratory wheezing. Gastrointestinal:   Soft and nontender. Non distended. There is no CVA tenderness.  No rebound, rigidity, or guarding. Genitourinary:   deferred Musculoskeletal:   Nontender with normal range of motion in all extremities. No joint effusions.  No lower extremity tenderness.  No edema. Neurologic:   Normal speech and language.  CN 2-10 normal. Motor grossly intact. No gross focal neurologic deficits are appreciated.  Skin:    Skin  is warm, dry and intact. No rash noted.  No petechiae, purpura, or  bullae.  ____________________________________________    LABS (pertinent positives/negatives) (all labs ordered are listed, but only abnormal results are displayed) Labs Reviewed  BASIC METABOLIC PANEL - Abnormal; Notable for the following:    Glucose, Bld 151 (*)    All other components within normal limits  CBC WITH DIFFERENTIAL/PLATELET - Abnormal; Notable for the following:    Neutro Abs 8.1 (*)    All other components within normal limits  BLOOD GAS, VENOUS - Abnormal; Notable for the following:    pH, Ven 7.19 (*)    pCO2, Ven 85 (*)    Bicarbonate 32.5 (*)    All other components within normal limits   ____________________________________________   EKG  Interpreted by me Sinus rhythm rate of 91, right axis, normal intervals. Right bundle branch block. Normal ST segments and T waves.  ____________________________________________    RADIOLOGY  Chest x-ray shows patchy opacity in the lateral right lung base suggestive of pneumonia  ____________________________________________   PROCEDURES  CRITICAL CARE Performed by: Joni Fears, Nile Prisk   Total critical care time: 35 minutes  Critical care time was exclusive of separately billable procedures and treating other patients.  Critical care was necessary to treat or prevent imminent or life-threatening deterioration.  Critical care was time spent personally by me on the following activities: development of treatment plan with patient and/or surrogate as well as nursing, discussions with consultants, evaluation of patient's response to treatment, examination of patient, obtaining history from patient or surrogate, ordering and performing treatments and interventions, ordering and review of laboratory studies, ordering and review of radiographic studies, pulse oximetry and re-evaluation of patient's  condition.  ____________________________________________   INITIAL IMPRESSION / ASSESSMENT AND PLAN / ED COURSE  Pertinent labs & imaging results that were available during my care of the patient were reviewed by me and considered in my medical decision making (see chart for details).  Patient presents with COPD exacerbation, found to have acute respiratory acidosis with hypercapnia. Switch from nasal cannula oxygen 5 L to BiPAP to help manage this. Given 2 DuoNeb's and 2 albuterol treatments in the ED on top of the medications are administered by EMS. He was suspicion for ACS PE or dissection. No evidence of sepsis. She does have what appears to be pneumonia on x-ray and was given Levaquin. Case discussed with hospitalist for admission    ____________________________________________   FINAL CLINICAL IMPRESSION(S) / ED DIAGNOSES  Final diagnoses:  None  COPD exacerbation Acute respiratory failure with hypoxia and hypercapnia Community acquired pneumonia     Portions of this note were generated with dragon dictation software. Dictation errors may occur despite best attempts at proofreading.   Carrie Mew, MD 11/29/15 [phone]

## 2015-11-29 NOTE — ED Notes (Signed)
Pt placed on bipap  

## 2015-11-29 NOTE — H&P (Signed)
Michelle [name] is an 55 y.o. female.   Chief Complaint: Shortness of breath HPI: The patient with history of COPD and emphysema presents emergency department complaining of shortness of breath. She was very hypoxic in the field and required supplemental oxygen via nonrebreather. She received Solu-Medrol 125 mg IV as well as magnesium and multiple breathing treatments prior to arrival in the emergency department. Venous blood gas showed significant acidosis is well as hypercarbia and she was placed on BiPAP with 40% FiO2. She seemed to improve significant only and after 45 minutes on noninvasive positive pressure ventilation the patient was weaned to supplement oxygen via nasal cannula. She states that her shortness of breath has worsened over the past day but she has been coughing and bringing up yellow phlegm for a few more days. She denies chest pain, fevers, nausea, vomiting or diarrhea. After her respiratory distress improved the emergency department staff called for admission.  Past Medical History  Diagnosis Date  . COPD (chronic obstructive pulmonary disease) ([place])   . Emphysema/COPD ([place])   . Hypertension   . High cholesterol   . Pneumothorax   . Coronary artery disease   . Cocaine abuse   . Tobacco abuse     Past Surgical History  Procedure Laterality Date  . Cardiac catheterization    . Chest tube insertion      Family History  Problem Relation Age of Onset  . Lung cancer Mother   . CVA Father   . CAD Father   . Breast cancer Maternal Aunt 67   Social History:  reports that she has been smoking Cigarettes.  She has been smoking about 0.50 packs per day. She does not have any smokeless tobacco history on file. She reports that she uses illicit drugs (Cocaine and Marijuana). She reports that she does not drink alcohol.  Allergies: No Known Allergies  Prior to Admission medications   Medication Sig Start Date End Date Taking? Authorizing Provider  albuterol (PROVENTIL  HFA;VENTOLIN HFA) 108 (90 Base) MCG/ACT inhaler Inhale 2 puffs into the lungs every 6 (six) hours as needed for wheezing or shortness of breath. 11/07/15  Yes Srikar Sudini, MD  aspirin EC 81 MG tablet Take 81 mg by mouth daily.   Yes Historical Provider, MD  carvedilol (COREG) 3.125 MG tablet Take 3.125 mg by mouth every morning. 10/24/15 11/29/15 Yes Historical Provider, MD  enalapril (VASOTEC) 10 MG tablet Take 1 tablet (10 mg total) by mouth daily. Patient taking differently: Take 5 mg by mouth daily.  10/28/14  Yes Nicholes Mango, MD  feeding supplement, ENSURE ENLIVE, (ENSURE ENLIVE) LIQD Take 237 mLs by mouth 2 (two) times daily between meals. 10/27/14  Yes Nicholes Mango, MD  Fluticasone-Salmeterol (ADVAIR) 250-50 MCG/DOSE AEPB Inhale 1 puff into the lungs 2 (two) times daily.   Yes Historical Provider, MD  guaiFENesin-dextromethorphan (ROBITUSSIN DM) 100-10 MG/5ML syrup Take 10 mLs by mouth every 8 (eight) hours as needed for cough. 10/27/14  Yes Nicholes Mango, MD  ipratropium-albuterol (DUONEB) 0.5-2.5 (3) MG/3ML SOLN Take 3 mLs by nebulization every 4 (four) hours as needed. 11/07/15  Yes Srikar Sudini, MD  LORazepam (ATIVAN) 0.5 MG tablet Take 1 tablet (0.5 mg total) by mouth 2 (two) times daily as needed for anxiety. 11/07/15  Yes Srikar Sudini, MD  simvastatin (ZOCOR) 20 MG tablet Take 20 mg by mouth at bedtime. 10/24/15 11/29/15 Yes Historical Provider, MD  tiotropium (SPIRIVA) 18 MCG inhalation capsule Place 18 mcg into inhaler and inhale daily.  Yes Historical Provider, MD     Results for orders placed or performed during the hospital encounter of 11/29/15 (from the past 48 hour(s))  Blood gas, venous     Status: Abnormal (Preliminary result)   Collection Time: 11/29/15  1:22 AM  Result Value Ref Range   FIO2 0.36    Delivery systems NASAL CANNULA     Comment: 4L   pH, Ven 7.19 (LL) 7.320 - 7.430    Comment: CRITICAL RESULT CALLED TO, READ BACK BY AND VERIFIED WITH:   pCO2, Ven 85 (HH) 44.0 -  60.0 mmHg    Comment: CRITICAL RESULT CALLED TO, READ BACK BY AND VERIFIED WITH: DR [id number] [id number] 11/29/15 OYM    pO2, Ven PENDING 31.0 - 45.0 mmHg   Bicarbonate 32.5 (H) 21.0 - 28.0 mEq/L   Acid-Base Excess 1.5 0.0 - 3.0 mmol/L   O2 Saturation PENDING %   Patient temperature 37.0    Collection site VENOUS    Sample type VENOUS   Basic metabolic panel     Status: Abnormal   Collection Time: 11/29/15  1:24 AM  Result Value Ref Range   Sodium 140 135 - 145 mmol/L   Potassium 4.7 3.5 - 5.1 mmol/L    Comment: HEMOLYSIS AT THIS LEVEL MAY AFFECT RESULT   Chloride 104 101 - 111 mmol/L   CO2 28 22 - 32 mmol/L   Glucose, Bld 151 (H) 65 - 99 mg/dL   BUN 15 6 - 20 mg/dL   Creatinine, Ser 0.91 0.44 - 1.00 mg/dL   Calcium 9.0 8.9 - 10.3 mg/dL   GFR calc non Af Amer >60 >60 mL/min   GFR calc Af Amer >60 >60 mL/min    Comment: (NOTE) The eGFR has been calculated using the CKD EPI equation. This calculation has not been validated in all clinical situations. eGFR's persistently <60 mL/min signify possible Chronic Kidney Disease.    Anion gap 8 5 - 15  CBC with Differential     Status: Abnormal   Collection Time: 11/29/15  1:24 AM  Result Value Ref Range   WBC 10.0 3.6 - 11.0 K/uL   RBC 4.53 3.80 - 5.20 MIL/uL   Hemoglobin 13.5 12.0 - 16.0 g/dL   HCT 41.0 35.0 - 47.0 %   MCV 90.5 80.0 - 100.0 fL   MCH 29.9 26.0 - 34.0 pg   MCHC 33.0 32.0 - 36.0 g/dL   RDW 14.4 11.5 - 14.5 %   Platelets 200 150 - 440 K/uL   Neutrophils Relative % 81% %   Neutro Abs 8.1 (H) 1.4 - 6.5 K/uL   Lymphocytes Relative 11% %   Lymphs Abs 1.1 1.0 - 3.6 K/uL   Monocytes Relative 8% %   Monocytes Absolute 0.8 0.2 - 0.9 K/uL   Eosinophils Relative 0% %   Eosinophils Absolute 0.0 0 - 0.7 K/uL   Basophils Relative 0% %   Basophils Absolute 0.0 0 - 0.1 K/uL  Blood gas, arterial     Status: Abnormal   Collection Time: 11/29/15  3:16 AM  Result Value Ref Range   FIO2 0.40    Inspiratory PAP 14    Expiratory PAP  6    pH, Arterial 7.31 (L) 7.350 - 7.450   pCO2 arterial 48 32.0 - 48.0 mmHg   pO2, Arterial 148 (H) 83.0 - 108.0 mmHg   Bicarbonate 24.2 21.0 - 28.0 mEq/L   Acid-base deficit 2.4 (H) 0.0 - 2.0 mmol/L   O2 Saturation 99.1 %  Patient temperature 37.0    Collection site RIGHT RADIAL    Sample type ARTERIAL DRAW    Allens test (pass/fail) YES (A) PASS   Dg Chest Portable 1 View  11/29/2015  CLINICAL DATA:  Dyspnea for 1 day. EXAM: PORTABLE CHEST 1 VIEW COMPARISON:  11/05/2015 FINDINGS: Marked hyperinflation. Mild patchy opacity in the right lateral base may represent infectious infiltrate or aspiration. No pneumothorax. Normal pulmonary vasculature. Unremarkable hilar and mediastinal contours. IMPRESSION: Mild patchy opacity in the lateral right lung base, superimposed on severe hyperinflation. Cannot exclude infectious infiltrate. Electronically Signed   By: Andreas Newport M.D.   On: 11/29/2015 01:43    Review of Systems  Constitutional: Negative for fever and chills.  HENT: Negative for sore throat and tinnitus.   Eyes: Negative for blurred vision and redness.  Respiratory: Positive for cough, sputum production and shortness of breath.   Cardiovascular: Negative for chest pain, palpitations, orthopnea and PND.  Gastrointestinal: Negative for nausea, vomiting, abdominal pain and diarrhea.  Genitourinary: Negative for dysuria, urgency and frequency.  Musculoskeletal: Negative for myalgias and joint pain.  Skin: Negative for rash.       No lesions  Neurological: Negative for speech change, focal weakness and weakness.  Endo/Heme/Allergies: Does not bruise/bleed easily.       No temperature intolerance  Psychiatric/Behavioral: Negative for depression and suicidal ideas.    Blood pressure 109/73, pulse 89, temperature 98.5 F (36.9 C), temperature source Oral, resp. rate 17, height _0  (1.448 m), weight 35.834 kg (79 lb), last menstrual period 03/06/2005, SpO2 100 %. Physical Exam   Vitals reviewed. Constitutional: She is oriented to person, place, and time. She appears well-developed and well-nourished. No distress.  HENT:  Head: Normocephalic and atraumatic.  Mouth/Throat: Oropharynx is clear and moist.  Eyes: Conjunctivae and EOM are normal. Pupils are equal, round, and reactive to light. No scleral icterus.  Neck: Normal range of motion. Neck supple. No JVD present. No tracheal deviation present. No thyromegaly present.  Cardiovascular: Normal rate, regular rhythm and normal heart sounds.  Exam reveals no gallop and no friction rub.   No murmur heard. Respiratory: Effort normal and breath sounds normal.  GI: Soft. Bowel sounds are normal. She exhibits no distension. There is no tenderness.  Genitourinary:  Deferred  Musculoskeletal: Normal range of motion. She exhibits no edema.  Lymphadenopathy:    She has no cervical adenopathy.  Neurological: She is alert and oriented to person, place, and time. No cranial nerve deficit. She exhibits normal muscle tone.  Skin: Skin is warm and dry. No rash noted. No erythema.  Psychiatric: She has a normal mood and affect. Her behavior is normal. Judgment and thought content normal.     Assessment/Plan This is a 55 year old female admitted for acute on chronic respiratory failure with hypercapnia and hypoxemia. 1. Respiratory failure with hypercapnia and hypoxemia: Acute on chronic; I have started the patient on a steroid taper and we will continue Levaquin for anti-inflammatory effect. Supplemental oxygen as needed. 2. COPD: Continue inhaled corticosteroid and Spiriva. Albuterol as needed 3. Essential hypertension: Continue enalapril and Coreg 4. Coronary artery disease: Continue aspirin 5. Hyperlipidemia: Continue statin therapy 6. DVT prophylaxis: Lovenox 7. GI prophylaxis: None The patient is a full code. Time spent on admission was inpatient care approximately 45 minutes  Harrie Foreman, MD 11/29/2015, 4:01  AM

## 2015-11-29 NOTE — Care Management (Signed)
Patient is readmitted within 30 days for exacerbation of copd.  She does have chronic home 02.  she is followed by [facility] and SW under charity care and [facility].  There was a slight delay in start of care as patient did not return calls when messages were left- then just did not answer calls.  The social worker has not been able to assess for the very same reason.  Patient did not call agency prior to presenting to the ED.    It appears she is compliant with her medications and uses 02 during the day when she thinks she needs it and while sleeping.    She is decreasing her smoking.  Agency is aware of admission

## 2015-11-29 NOTE — Progress Notes (Signed)
Subjective: Admitted with acute on chronic resp faailure due to COPD. Still c/o productive cough and shortness of breath.  Objective: Vital signs in last 24 hours: Temp:  [97.6 F (36.4 C)-98.5 F (36.9 C)] 97.6 F (36.4 C) (06/27 0521) Pulse Rate:  [84-95] 85 (06/27 0521) Resp:  [15-26] 18 (06/27 0521) BP: (107-161)/(65-119) 133/65 mmHg (06/27 0521) SpO2:  [96 %-100 %] 100 % (06/27 0521) FiO2 (%):  [40 %-45 %] 45 % (06/27 0159) Weight:  [35.834 kg (79 lb)-37.512 kg (82 lb 11.2 oz)] 37.512 kg (82 lb 11.2 oz) (06/27 0521) Weight change:  Last BM Date: 11/28/15  Intake/Output from previous day:   Intake/Output this shift:    General appearance: alert and mild distress Head: Normocephalic, without obvious abnormality, atraumatic Eyes: conjunctivae/corneas clear. PERRL, EOM's intact. Fundi benign. Ears: normal TM's and external ear canals both ears Nose: Nares normal. Septum midline. Mucosa normal. No drainage or sinus tenderness. Throat: lips, mucosa, and tongue normal; teeth and gums normal Resp: Poor airmovement. Bilateral wheezing. No dullness to percussion. Mild use of accessary muscles Cardio: regular rate and rhythm, S1, S2 normal, no murmur, click, rub or gallop GI: soft, non-tender; bowel sounds normal; no masses,  no organomegaly Extremities: extremities normal, atraumatic, no cyanosis or edema Skin: Skin color, texture, turgor normal. No rashes or lesions Neurologic: Alert and oriented X 3, normal strength and tone. Normal symmetric reflexes. Normal coordination and gait  Lab Results:  Recent Labs  11/29/15 0124  WBC 10.0  HGB 13.5  HCT 41.0  PLT 200   BMET  Recent Labs  11/29/15 0124  NA 140  K 4.7  CL 104  CO2 28  GLUCOSE 151*  BUN 15  CREATININE 0.91  CALCIUM 9.0    Studies/Results: Dg Chest Portable 1 View  11/29/2015  CLINICAL DATA:  Dyspnea for 1 day. EXAM: PORTABLE CHEST 1 VIEW COMPARISON:  11/05/2015 FINDINGS: Marked hyperinflation. Mild  patchy opacity in the right lateral base may represent infectious infiltrate or aspiration. No pneumothorax. Normal pulmonary vasculature. Unremarkable hilar and mediastinal contours. IMPRESSION: Mild patchy opacity in the lateral right lung base, superimposed on severe hyperinflation. Cannot exclude infectious infiltrate. Electronically Signed   By: Andreas Newport M.D.   On: 11/29/2015 01:43    Medications:  I have reviewed the patient's current medications. Scheduled: . albuterol  2.5 mg Nebulization Q4H  . aspirin EC  81 mg Oral Daily  . carvedilol  3.125 mg Oral BH-q7a  . docusate sodium  100 mg Oral BID  . enalapril  10 mg Oral Daily  . enoxaparin (LOVENOX) injection  40 mg Subcutaneous Q24H  . feeding supplement (ENSURE ENLIVE)  237 mL Oral BID BM  . levofloxacin  250 mg Oral Daily  . mometasone-formoterol  2 puff Inhalation BID  . predniSONE  50 mg Oral Q breakfast   Followed by  . [START ON 11/30/2015] predniSONE  40 mg Oral Q breakfast   Followed by  . [START ON 12/01/2015] predniSONE  30 mg Oral Q breakfast   Followed by  . [START ON 12/02/2015] predniSONE  20 mg Oral Q breakfast   Followed by  . [START ON 12/03/2015] predniSONE  10 mg Oral Q breakfast  . simvastatin  20 mg Oral QHS  . tiotropium  18 mcg Inhalation Daily   Continuous:   Assessment/Plan: 1. Acute on Chronic Resp Failure: Now off bibap. Wean oxygen to base line as tolerated.  2. COPD Exacerbation: Still poor air movement. On nebs and steriods.Marland Kitchen  Taper as tolerated. Consider switching back to IV steriods if worsens.  3. Acute Bronchitis: On abx.  4. HTN: Continue home meds.  Time spent= 35 min  LOS: 0 days   Baxter Hire 11/29/2015, 8:33 AM

## 2015-11-29 NOTE — ED Notes (Signed)
Pt's oxygen reduced to 2L per pt request

## 2015-11-29 NOTE — Plan of Care (Signed)
Problem: Phase I Progression Outcomes Goal: O2 sats > or equal 90% or at baseline Outcome: Progressing Patient currently on 2L 94%

## 2015-11-29 NOTE — Progress Notes (Signed)
Patient arrived ED on nasal cannula with oxygen saturation in the mid to high nineties, diminished to auscultation with a strong congestive productive cough. VBG drawn by RN revealing hypoxemia and hypercapnia. Patient placed on BIPAP per MD order on IPAP of 14, EPAP 6 and FIO2 of 40%. Patient left in no acute respiratory distress. I will continue monitoring.

## 2015-11-29 NOTE — ED Notes (Signed)
Bipap removed per Dr. Marcille Blanco order, pt placed on 4L o2 via [place], pt given sprite and graham crackers per pt request

## 2015-11-29 NOTE — Plan of Care (Signed)
Problem: Consults Goal: Respiratory Problems Patient Education See Patient Education Module for education specifics. Outcome: Completed/Met Date Met:  11/29/15 Handouts at the bedside

## 2015-11-29 NOTE — Plan of Care (Signed)
Problem: Phase I Progression Outcomes Goal: Pain controlled Outcome: Progressing Patient c/o of HA 8/10. Morphine given 2mg . Will continue to monitor.

## 2015-11-29 NOTE — Plan of Care (Signed)
Problem: Consults Goal: Skin Care Protocol Initiated - if Braden Score 18 or less If consults are not indicated, leave blank or document N/A Outcome: Completed/Met Date Met:  11/29/15 No skin issues noted on admission     

## 2015-11-29 NOTE — Plan of Care (Signed)
Problem: Phase I Progression Outcomes Goal: Dyspnea controlled at rest Outcome: Progressing Dyspnea controlled at rest during shift

## 2015-11-30 ENCOUNTER — Inpatient Hospital Stay (HOSPITAL_COMMUNITY)
Admit: 2015-11-30 | Discharge: 2015-11-30 | Disposition: A | Discharge status: Home / Self Care | Payer: Medicaid Other | Attending: Internal Medicine | Admitting: Internal Medicine

## 2015-11-30 ENCOUNTER — Inpatient Hospital Stay: Payer: Medicaid Other

## 2015-11-30 DIAGNOSIS — I5031 Acute diastolic (congestive) heart failure: Secondary | ICD-10-CM

## 2015-11-30 LAB — BLOOD GAS, VENOUS
ACID-BASE EXCESS: 1.5 mmol/L (ref 0.0–3.0)
BICARBONATE: 32.5 meq/L — AB (ref 21.0–28.0)
FIO2: 0.36
PATIENT TEMPERATURE: 37
PH VEN: 7.19 — AB (ref 7.320–7.430)
pCO2, Ven: 85 mmHg (ref 44.0–60.0)

## 2015-11-30 MED ORDER — HYDROCODONE-ACETAMINOPHEN 5-325 MG PO TABS
1.0000 | ORAL_TABLET | Freq: Four times a day (QID) | ORAL | Status: DC | PRN
  Administered 2015-11-30: 1 via ORAL
  Filled 2015-11-30: qty 1

## 2015-11-30 MED ORDER — ENOXAPARIN SODIUM 30 MG/0.3ML ~~LOC~~ SOLN
30.0000 mg | SUBCUTANEOUS | Status: DC
  Administered 2015-11-30: 30 mg via SUBCUTANEOUS
  Filled 2015-11-30: qty 0.3

## 2015-11-30 MED ORDER — ALBUTEROL SULFATE (2.5 MG/3ML) 0.083% IN NEBU
2.5000 mg | INHALATION_SOLUTION | Freq: Four times a day (QID) | RESPIRATORY_TRACT | Status: DC
  Administered 2015-11-30 – 2015-12-01 (×5): 2.5 mg via RESPIRATORY_TRACT
  Filled 2015-11-30 (×5): qty 3

## 2015-11-30 MED ORDER — NICOTINE 14 MG/24HR TD PT24
14.0000 mg | MEDICATED_PATCH | Freq: Every day | TRANSDERMAL | Status: DC
Start: 2015-11-30 — End: 2015-12-01
  Administered 2015-11-30 – 2015-12-01 (×2): 14 mg via TRANSDERMAL
  Filled 2015-11-30 (×2): qty 1

## 2015-11-30 MED ORDER — METHYLPREDNISOLONE SODIUM SUCC 125 MG IJ SOLR
60.0000 mg | Freq: Two times a day (BID) | INTRAMUSCULAR | Status: DC
  Administered 2015-11-30 – 2015-12-01 (×3): 60 mg via INTRAVENOUS
  Filled 2015-11-30 (×3): qty 2

## 2015-11-30 NOTE — Progress Notes (Signed)
[facility] [place] NAME: Michelle [name]    MR#:  [id number]  DATE OF BIRTH:  06-14-1960  SUBJECTIVE:   Patient still feeling short of breath when ambulating. She received DuoNeb's and shortness of breath improved. She was having wheezing prior to DuoNeb's  REVIEW OF SYSTEMS:    Review of Systems  Constitutional: Negative for fever, chills and malaise/fatigue.  HENT: Negative for ear discharge, ear pain, hearing loss, nosebleeds and sore throat.   Eyes: Negative for blurred vision and pain.  Respiratory: Positive for cough and shortness of breath. Negative for hemoptysis and wheezing.   Cardiovascular: Negative for chest pain, palpitations and leg swelling.  Gastrointestinal: Negative for nausea, vomiting, abdominal pain, diarrhea and blood in stool.  Genitourinary: Negative for dysuria.  Musculoskeletal: Negative for back pain.  Neurological: Negative for dizziness, tremors, speech change, focal weakness, seizures and headaches.  Endo/Heme/Allergies: Does not bruise/bleed easily.  Psychiatric/Behavioral: Negative for depression, suicidal ideas and hallucinations.    Tolerating Diet: yes      DRUG ALLERGIES:  No Known Allergies  VITALS:  Blood pressure 117/77, pulse 67, temperature 98.3 F (36.8 C), temperature source Oral, resp. rate 18, height 4\' 9"  (1.448 m), weight 38.193 kg (84 lb 3.2 oz), last menstrual period 03/06/2005, SpO2 100 %.  PHYSICAL EXAMINATION:   Physical Exam  Constitutional: She is oriented to person, place, and time and well-developed, well-nourished, and in no distress. No distress.  HENT:  Head: Normocephalic.  Eyes: No scleral icterus.  Neck: Normal range of motion. Neck supple. No JVD present. No tracheal deviation present.  Cardiovascular: Normal rate, regular rhythm and normal heart sounds.  Exam reveals no gallop and no friction rub.   No murmur heard. Pulmonary/Chest: Effort normal and breath sounds  normal. No respiratory distress. She has no wheezes. She has no rales. She exhibits no tenderness.  Abdominal: Soft. Bowel sounds are normal. She exhibits no distension and no mass. There is no tenderness. There is no rebound and no guarding.  Musculoskeletal: Normal range of motion. She exhibits no edema.  Neurological: She is alert and oriented to person, place, and time.  Skin: Skin is warm. No rash noted. No erythema.  Psychiatric: Affect and judgment normal.      LABORATORY PANEL:   CBC  Recent Labs Lab 11/29/15 0124  WBC 10.0  HGB 13.5  HCT 41.0  PLT 200   ------------------------------------------------------------------------------------------------------------------  Chemistries   Recent Labs Lab 11/29/15 0124  NA 140  K 4.7  CL 104  CO2 28  GLUCOSE 151*  BUN 15  CREATININE 0.91  CALCIUM 9.0   ------------------------------------------------------------------------------------------------------------------  Cardiac Enzymes No results for input(s): TROPONINI in the last 168 hours. ------------------------------------------------------------------------------------------------------------------  RADIOLOGY:  Dg Chest 1 View  11/30/2015  CLINICAL DATA:  Shortness of Breath EXAM: CHEST 1 VIEW COMPARISON:  11/29/2015 FINDINGS: Cardiomediastinal silhouette is stable. Hyperinflation again noted. No infiltrate or pulmonary edema. Minimal pleural fluid or pleural thickening in right costophrenic angle with improvement in aeration. IMPRESSION: No infiltrate or pulmonary edema. Minimal pleural fluid or pleural thickening in right costophrenic angle with improvement in aeration. Hyperinflation again noted. Electronically Signed   By: Lahoma Crocker M.D.   On: 11/30/2015 10:16   Dg Chest Portable 1 View  11/29/2015  CLINICAL DATA:  Dyspnea for 1 day. EXAM: PORTABLE CHEST 1 VIEW COMPARISON:  11/05/2015 FINDINGS: Marked hyperinflation. Mild patchy opacity in the right lateral  base may represent infectious infiltrate or aspiration. No pneumothorax.  Normal pulmonary vasculature. Unremarkable hilar and mediastinal contours. IMPRESSION: Mild patchy opacity in the lateral right lung base, superimposed on severe hyperinflation. Cannot exclude infectious infiltrate. Electronically Signed   By: Andreas Newport M.D.   On: 11/29/2015 01:43     ASSESSMENT AND PLAN:   55 year old female with a history of tobacco and COPD who presents with acute COPD exacerbation.  1. Acute on chronic respiratory failure, hypoxic: Patient is now off BiPAP. Wean oxygen to baseline. Heck echocardiogram. 2. Acute COPD exacerbation: Patient continues to have shortness of breath and wheezing. Change oral prednisone and IV steroids. Continue nebulizer treatments and inhalers.  3. Community-acquired pneumonia: I suspect patient has right lower lobe infiltrate. Continue Levaquin.  4.tobacco dependence: Patient is encouraged to stop smoking. She was counseled for 3 minutes.  5. Essential hypertension: Continue Coreg and enalapril. Blood pressure controlled.  6. Protein calorie malnutrition: Continue feeding supplement.  Management plans discussed with the patient and she is in agreement.  CODE STATUS: FUKK  TOTAL TIME TAKING CARE OF THIS PATIENT: 28 minutes.     POSSIBLE D/C 2-3 days, DEPENDING ON CLINICAL CONDITION.   Umair Rosiles M.D on 11/30/2015 at 1:41 PM  Between 7am to 6pm - Pager - [phone] After 6pm go to [email or web address] - password [email or web address] [facility]  Office  [phone]  CC: Primary care physician; Einar Pheasant, MD  Note: This dictation was prepared with Dragon dictation along with smaller phrase technology. Any transcriptional errors that result from this process are unintentional.

## 2015-11-30 NOTE — Evaluation (Signed)
Physical Therapy Evaluation Patient Details Name: Michelle [name] MRN: [id number] DOB: 1960-12-04 Today's Date: 11/30/2015   History of Present Illness  Patient is a 55 y/o female with repeat admission for SOB, required non-rebreather in the field and BiPap upon arrival. She has since been able to tolerate [place].   Clinical Impression  Patient was evaluated after repeat admission for shortness of breath. She is independent at baseline, uses 1-2L of O2 supplemental at home. She does not have any significant decline in O2 sats during ambulation. She demonstrates no loss of balance, no deficits identified with mobility aside from dyspnea. PT is indicated for acute hospitalization to address cardiopulmonary deficits, would recommend pulmonary rehab after discharge to follow up.     Follow Up Recommendations No PT follow up (Pulmonary rehab more appropriate. )    Equipment Recommendations       Recommendations for Other Services       Precautions / Restrictions Precautions Precautions: Fall Restrictions Weight Bearing Restrictions: No      Mobility  Bed Mobility Overal bed mobility: Independent             General bed mobility comments: No deficits identified.   Transfers Overall transfer level: Independent Equipment used: None             General transfer comment: No loss of balance, no deficits identified.   Ambulation/Gait Ambulation/Gait assistance: Supervision Ambulation Distance (Feet): 200 Feet Assistive device: None Gait Pattern/deviations: WFL(Within Functional Limits)   Gait velocity interpretation: Below normal speed for age/gender General Gait Details: Gait speed is declined and requires one rest break. On 2L of O2, her sats remained from 95-98%.   Stairs            Wheelchair Mobility    Modified Rankin (Stroke Patients Only)       Balance Overall balance assessment: Modified Independent                                           Pertinent Vitals/Pain Pain Assessment: No/denies pain    Home Living Family/patient expects to be discharged to:: Private residence Living Arrangements: Children Available Help at Discharge: Family Type of Home: House Home Access: Stairs to enter Entrance Stairs-Rails: Can reach both Entrance Stairs-Number of Steps: 4 Home Layout: One level        Prior Function Level of Independence: Independent         Comments: Independent with all household/ + driving      Hand Dominance        Extremity/Trunk Assessment   Upper Extremity Assessment: Overall WFL for tasks assessed           Lower Extremity Assessment: Overall WFL for tasks assessed         Communication   Communication: No difficulties  Cognition Arousal/Alertness: Awake/alert Behavior During Therapy: WFL for tasks assessed/performed Overall Cognitive Status: Within Functional Limits for tasks assessed                      General Comments      Exercises        Assessment/Plan    PT Assessment Patient needs continued PT services  PT Diagnosis Generalized weakness   PT Problem List Cardiopulmonary status limiting activity  PT Treatment Interventions Gait training;Therapeutic activities;Therapeutic exercise   PT Goals (Current goals can be found in the Care  Plan section) Acute Rehab PT Goals Patient Stated Goal: Patient would like to return home  PT Goal Formulation: With patient Time For Goal Achievement: 12/14/15 Potential to Achieve Goals: Good    Frequency Min 2X/week   Barriers to discharge        Co-evaluation               End of Session Equipment Utilized During Treatment: Gait belt;Oxygen Activity Tolerance: Patient tolerated treatment well Patient left: in chair;with call bell/phone within reach;with chair alarm set Nurse Communication: Mobility status         Time: 1035-1050 PT Time Calculation (min) (ACUTE ONLY): 15 min   Charges:   PT  Evaluation $PT Eval Moderate Complexity: 1 Procedure     PT G Codes:       Kerman Passey, PT, DPT    11/30/2015, 11:18 AM

## 2015-11-30 NOTE — Progress Notes (Signed)
Pharmacy Antibiotic Note  Michelle [name] is a 55 y.o. female admitted on 11/29/2015 with pneumonia/CAP.  Pharmacy has been consulted for levofloxacin dosing. Patient was started on levofloxacin on admission 11/29/15.  Plan: Will continue patients Levofloxacin 250mg  daily due to crcl <81ml/min. Recommend duration of therapy 5-7 days.  Pharmacy will continue to adjust dose as needed based on renal function.   Height: 4\' 9"  (144.8 cm) Weight: 84 lb 3.2 oz (38.193 kg) IBW/kg (Calculated) : 38.6  Temp (24hrs), Avg:97.7 F (36.5 C), Min:97.2 F (36.2 C), Max:98.5 F (36.9 C)   Recent Labs Lab 11/29/15 0124  WBC 10.0  CREATININE 0.91    Estimated Creatinine Clearance: 42.6 mL/min (by C-G formula based on Cr of 0.91).    No Known Allergies  Antimicrobials this admission: 11/29/15 >> Levofloxacin   Thank you for allowing pharmacy to be a part of this patient's care.  Nancy Fetter, PharmD Clinical Pharmacist 11/30/2015 8:54 AM

## 2015-12-01 ENCOUNTER — Inpatient Hospital Stay: Payer: Medicaid Other

## 2015-12-01 ENCOUNTER — Encounter: Payer: Self-pay | Admitting: Radiology

## 2015-12-01 LAB — ECHOCARDIOGRAM COMPLETE
CHL CUP MV DEC (S): 218
E/e' ratio: 11.82
EWDT: 218 ms
FS: 43 % (ref 28–44)
HEIGHTINCHES: 57 in
IV/PV OW: 1.03
LA vol A4C: 13.4 ml
LA vol index: 11.2 mL/m2
LA vol: 14 mL
LADIAMINDEX: 2.24 cm/m2
LASIZE: 28 mm
LDCA: 2.54 cm2
LEFT ATRIUM END SYS DIAM: 28 mm
LV E/e' medial: 11.82
LV TDI E'MEDIAL: 7.94
LV e' LATERAL: 7.51 cm/s
LVEEAVG: 11.82
LVOT VTI: 16.4 cm
LVOT peak vel: 106 cm/s
LVOTD: 18 mm
LVOTSV: 42 mL
MV Peak grad: 3 mmHg
MV pk A vel: 93 m/s
MVPKEVEL: 88.8 m/s
PW: 12.1 mm — AB (ref 0.6–1.1)
TDI e' lateral: 7.51
Weight: 1347.2 oz

## 2015-12-01 LAB — BASIC METABOLIC PANEL
Anion gap: 3 — ABNORMAL LOW (ref 5–15)
BUN: 20 mg/dL (ref 6–20)
CALCIUM: 8.8 mg/dL — AB (ref 8.9–10.3)
CHLORIDE: 108 mmol/L (ref 101–111)
CO2: 27 mmol/L (ref 22–32)
CREATININE: 0.58 mg/dL (ref 0.44–1.00)
GFR calc Af Amer: >60 mL/min (ref >60)
GFR calc non Af Amer: >60 mL/min (ref >60)
GLUCOSE: 130 mg/dL — AB (ref 65–99)
Potassium: 4.4 mmol/L (ref 3.5–5.1)
Sodium: 138 mmol/L (ref 135–145)

## 2015-12-01 MED ORDER — NICOTINE 14 MG/24HR TD PT24: 14.0000 mg | MEDICATED_PATCH | Freq: Every day | TRANSDERMAL | Status: DC

## 2015-12-01 MED ORDER — IOPAMIDOL (ISOVUE-370) INJECTION 76%
75.0000 mL | Freq: Once | INTRAVENOUS | Status: AC | PRN
  Administered 2015-12-01: 75 mL via INTRAVENOUS

## 2015-12-01 MED ORDER — CARVEDILOL 3.125 MG PO TABS: 3.1250 mg | ORAL_TABLET | ORAL | Status: DC

## 2015-12-01 MED ORDER — ENALAPRIL MALEATE 10 MG PO TABS: 5.0000 mg | ORAL_TABLET | Freq: Every day | ORAL | Status: DC

## 2015-12-01 MED ORDER — PREDNISONE 10 MG PO TABS: 10.0000 mg | ORAL_TABLET | Freq: Every day | ORAL | Status: DC

## 2015-12-01 MED ORDER — LEVOFLOXACIN 250 MG PO TABS: 250.0000 mg | ORAL_TABLET | Freq: Every day | ORAL | Status: DC

## 2015-12-01 MED ORDER — LORAZEPAM 0.5 MG PO TABS: 1.0000 mg | ORAL_TABLET | Freq: Two times a day (BID) | ORAL | Status: DC | PRN

## 2015-12-01 NOTE — Care Management (Signed)
Anticipate discharge home today.  Patient says her family will be transporting her home and verbalizes understanding that she will need her portable 02 tank.  Advanced informed of discharge and the need to resume visits

## 2015-12-01 NOTE — Discharge Summary (Signed)
[facility] [place] NAME: Michelle [name]    MR#:  [id number]  DATE OF BIRTH:  Nov 27, 1960  DATE OF ADMISSION:  11/29/2015 ADMITTING PHYSICIAN: Harrie Foreman, MD  DATE OF DISCHARGE: 12/01/2015  PRIMARY CARE PHYSICIAN: Einar Pheasant, MD    ADMISSION DIAGNOSIS:  Community acquired pneumonia [J18.9] COPD with exacerbation ([place]) [J44.1] Acute respiratory failure with hypoxia and hypercapnia ([place]) [J96.01, J96.02]  DISCHARGE DIAGNOSIS:  Active Problems:   Acute on chronic respiratory failure with hypercapnia ([place])   SECONDARY DIAGNOSIS:   Past Medical History  Diagnosis Date  . COPD (chronic obstructive pulmonary disease) ([place])   . Emphysema/COPD ([place])   . Hypertension   . High cholesterol   . Pneumothorax   . Coronary artery disease   . Cocaine abuse   . Tobacco abuse   . Asthma   . CHF (congestive heart failure) [facility])     HOSPITAL COURSE:   55 year old female with a history of tobacco and COPD who presents with acute COPD exacerbation.  1. Acute on chronic respiratory failure, hypoxic: Patient was initially placed on BiPAP and was weaned to baseline oxygen. She was started IV steroids and antibiotics. On examination at discharge her lungs are clear to also dictation. She continues to have some shortness of breath which is likely related to progressive COPD. 2-D echocardiogram showed normal ejection fraction without valvular abnormalities and no signs of right heart failure. CT scan was negative pulmonary emboli.   2. Acute COPD exacerbation: She will be on by mouth prednisone taper and antibiotics.  She will continue outpatient regimen. She has follow-up with her pulmonologist in  [facility].  3. Community-acquired pneumonia, right lower lobe infiltrate. Continue Levaquin for 3 more days.  4.tobacco dependence: Patient is encouraged to stop smoking. She was counseled for 3 minutes.  5. Essential hypertension: Continue Coreg  and enalapril. Blood pressure controlled.  6. Protein calorie malnutrition: Continue feeding supplement.  7. ASCVD: Continue aspirin, simvastatin, Coreg and enalapril. She was advised to follow-up with her cardiologist at [facility] as well. DISCHARGE CONDITIONS AND DIET:  Stable for discharge on a heart healthy diet   CONSULTS OBTAINED:     DRUG ALLERGIES:  No Known Allergies  DISCHARGE MEDICATIONS:   Current Discharge Medication List    START taking these medications   Details  levofloxacin (LEVAQUIN) 250 MG tablet Take 1 tablet (250 mg total) by mouth daily. Qty: 3 tablet, Refills: 0    nicotine (NICODERM CQ - DOSED IN MG/24 HOURS) 14 mg/24hr patch Place 1 patch (14 mg total) onto the skin daily. Qty: 28 patch, Refills: 0    predniSONE (DELTASONE) 10 MG tablet Take 1 tablet (10 mg total) by mouth daily with breakfast. 60 mg PO (ORAL) x 2 days 50 mg PO (ORAL)  x 2 days 40 mg PO (ORAL)  x 2 days 30 mg PO  (ORAL)  x 2 days 20 mg PO  (ORAL) x 2 days 10 mg PO  (ORAL) x 2 days then stop Qty: 42 tablet, Refills: 0      CONTINUE these medications which have CHANGED   Details  carvedilol (COREG) 3.125 MG tablet Take 1 tablet (3.125 mg total) by mouth every morning. Qty: 60 tablet, Refills: 0    enalapril (VASOTEC) 10 MG tablet Take 0.5 tablets (5 mg total) by mouth daily. Qty: 30 tablet, Refills: 0    LORazepam (ATIVAN) 0.5 MG tablet Take 2 tablets (1 mg total) by mouth 2 (  two) times daily as needed for anxiety. Qty: 30 tablet, Refills: 0      CONTINUE these medications which have NOT CHANGED   Details  albuterol (PROVENTIL HFA;VENTOLIN HFA) 108 (90 Base) MCG/ACT inhaler Inhale 2 puffs into the lungs every 6 (six) hours as needed for wheezing or shortness of breath. Qty: 18 g, Refills: 0    aspirin EC 81 MG tablet Take 81 mg by mouth daily.    feeding supplement, ENSURE ENLIVE, (ENSURE ENLIVE) LIQD Take 237 mLs by mouth 2 (two) times daily between meals. Qty: 237 mL,  Refills: 12    Fluticasone-Salmeterol (ADVAIR) 250-50 MCG/DOSE AEPB Inhale 1 puff into the lungs 2 (two) times daily.    guaiFENesin-dextromethorphan (ROBITUSSIN DM) 100-10 MG/5ML syrup Take 10 mLs by mouth every 8 (eight) hours as needed for cough. Qty: 118 mL, Refills: 0    ipratropium-albuterol (DUONEB) 0.5-2.5 (3) MG/3ML SOLN Take 3 mLs by nebulization every 4 (four) hours as needed. Qty: 360 mL, Refills: 0    simvastatin (ZOCOR) 20 MG tablet Take 20 mg by mouth at bedtime.    tiotropium (SPIRIVA) 18 MCG inhalation capsule Place 18 mcg into inhaler and inhale daily.              Today   CHIEF COMPLAINT:  Doing well this morning. No increasing shortness of breath, wheezing or chest pain. No lower extremity edema.   VITAL SIGNS:  Blood pressure 126/72, pulse 77, temperature 98.5 F (36.9 C), temperature source Oral, resp. rate 18, height 4\' 9"  (1.448 m), weight 38.601 kg (85 lb 1.6 oz), last menstrual period 03/06/2005, SpO2 100 %.   REVIEW OF SYSTEMS:  Review of Systems  Constitutional: Negative for fever, chills and malaise/fatigue.  HENT: Negative for ear discharge, ear pain, hearing loss, nosebleeds and sore throat.   Eyes: Negative for blurred vision and pain.  Respiratory: Positive for shortness of breath. Negative for cough, hemoptysis and wheezing.   Cardiovascular: Negative for chest pain, palpitations and leg swelling.  Gastrointestinal: Negative for nausea, vomiting, abdominal pain, diarrhea and blood in stool.  Genitourinary: Negative for dysuria.  Musculoskeletal: Negative for back pain.  Neurological: Negative for dizziness, tremors, speech change, focal weakness, seizures and headaches.  Endo/Heme/Allergies: Does not bruise/bleed easily.  Psychiatric/Behavioral: Negative for depression, suicidal ideas and hallucinations.     PHYSICAL EXAMINATION:  GENERAL:  55 y.o.-year-old patient lying in the bed with no acute distress.  NECK:  Supple, no  jugular venous distention. No thyroid enlargement, no tenderness.  LUNGS: Normal breath sounds bilaterally, no wheezing, rales,rhonchi  No use of accessory muscles of respiration.  CARDIOVASCULAR: S1, S2 normal. No murmurs, rubs, or gallops.  ABDOMEN: Soft, non-tender, non-distended. Bowel sounds present. No organomegaly or mass.  EXTREMITIES: No pedal edema, cyanosis, or clubbing.  PSYCHIATRIC: The patient is alert and oriented x 3.  SKIN: No obvious rash, lesion, or ulcer.   DATA REVIEW:   CBC  Recent Labs Lab 11/29/15 0124  WBC 10.0  HGB 13.5  HCT 41.0  PLT 200    Chemistries   Recent Labs Lab 12/01/15 0446  NA 138  K 4.4  CL 108  CO2 27  GLUCOSE 130*  BUN 20  CREATININE 0.58  CALCIUM 8.8*    Cardiac Enzymes No results for input(s): TROPONINI in the last 168 hours.  Microbiology Results  @MICRORSLT48 @  RADIOLOGY:  Dg Chest 1 View  11/30/2015  CLINICAL DATA:  Shortness of Breath EXAM: CHEST 1 VIEW COMPARISON:  11/29/2015 FINDINGS: Cardiomediastinal silhouette  is stable. Hyperinflation again noted. No infiltrate or pulmonary edema. Minimal pleural fluid or pleural thickening in right costophrenic angle with improvement in aeration. IMPRESSION: No infiltrate or pulmonary edema. Minimal pleural fluid or pleural thickening in right costophrenic angle with improvement in aeration. Hyperinflation again noted. Electronically Signed   By: Lahoma Crocker M.D.   On: 11/30/2015 10:16   Ct Angio Chest Pe W Or Wo Contrast  12/01/2015  CLINICAL DATA:  Pleuritic pain. Chronic respiratory failure. Hypoxia. Shortness of breath and wheeze seen. History of COPD, hypertension, smoking. EXAM: CT ANGIOGRAPHY CHEST WITH CONTRAST TECHNIQUE: Multidetector CT imaging of the chest was performed using the standard protocol during bolus administration of intravenous contrast. Multiplanar CT image reconstructions and MIPs were obtained to evaluate the vascular anatomy. CONTRAST:  75 cc Isovue 370  COMPARISON:  Chest x-ray 11/30/2015 FINDINGS: Cardiovascular: Coronary stents are visible. Moderate atherosclerosis of the coronary arteries. Heart size is normal. No pericardial effusion. There is atherosclerotic calcification of the thoracic aorta which is mildly tortuous but not aneurysmal. The pulmonary arteries are well opacified. There is no acute pulmonary embolus. Mediastinum/Nodes: The visualized portion of the thyroid gland has a normal appearance. No mediastinal, hilar, or axillary adenopathy. The esophagus has a normal appearance. Lungs/Pleura: Right lower lobe atelectasis is present. No pleural effusions or suspicious pulmonary nodules. Airways are patent. Upper Abdomen: Unremarkable. Musculoskeletal: Unremarkable. Review of the MIP images confirms the above findings. IMPRESSION: 1. Technically adequate exam showing no acute pulmonary embolus. 2. Coronary artery disease and coronary artery stents. 3. Right lower lobe atelectasis. Electronically Signed   By: Nolon Nations M.D.   On: 12/01/2015 11:19      Management plans discussed with the patient and she is in agreement. Stable for discharge home with home health  Patient should follow up with PCP, cardiology and pulmonary in 1 week  CODE STATUS:     Code Status Orders        Start     Ordered   11/29/15 0520  Full code   Continuous     11/29/15 0519    Code Status History    Date Active Date Inactive Code Status Order ID Comments User Context   11/05/2015  9:11 AM 11/07/2015  4:06 PM Full Code [id number]  Hillary Bow, MD ED   10/24/2014  3:22 PM 10/27/2014  4:30 PM Full Code [id number]  Hillary Bow, MD Inpatient      TOTAL TIME TAKING CARE OF THIS PATIENT: 35 minutes.    Note: This dictation was prepared with Dragon dictation along with smaller phrase technology. Any transcriptional errors that result from this process are unintentional.  Dhrithi Riche M.D on 12/01/2015 at 11:46 AM  Between 7am to 6pm - Pager -  [phone] After 6pm go to [email or web address] - password [email or web address] [facility]  Office  [phone]  CC: Primary care physician; Einar Pheasant, MD

## 2015-12-01 NOTE — Progress Notes (Signed)
Discharge instructions given. Tele removed while patient getting washed up for discharge. Prescriptions faxed to medication management by care management and paper prescription for ativan given to patient. Education on respiratory failure given. Questions answered. Patient's family member is coming to pick her up and is to bring home oxygen. Will remove IV once ride of on the way.

## 2016-01-19 ENCOUNTER — Encounter (INDEPENDENT_AMBULATORY_CARE_PROVIDER_SITE_OTHER): Payer: Self-pay

## 2016-01-21 ENCOUNTER — Encounter: Payer: Self-pay | Admitting: Pharmacist

## 2016-02-06 IMAGING — CR DG CHEST 1V PORT
1 series · 1 of 1 positions shown · non-contrast
Comparison: 07/22/2014 and earlier.

CLINICAL DATA: Fever and cough which began yesterday. Current
history of COPD and hypertension.

EXAM:
PORTABLE CHEST - 1 VIEW

[ap]
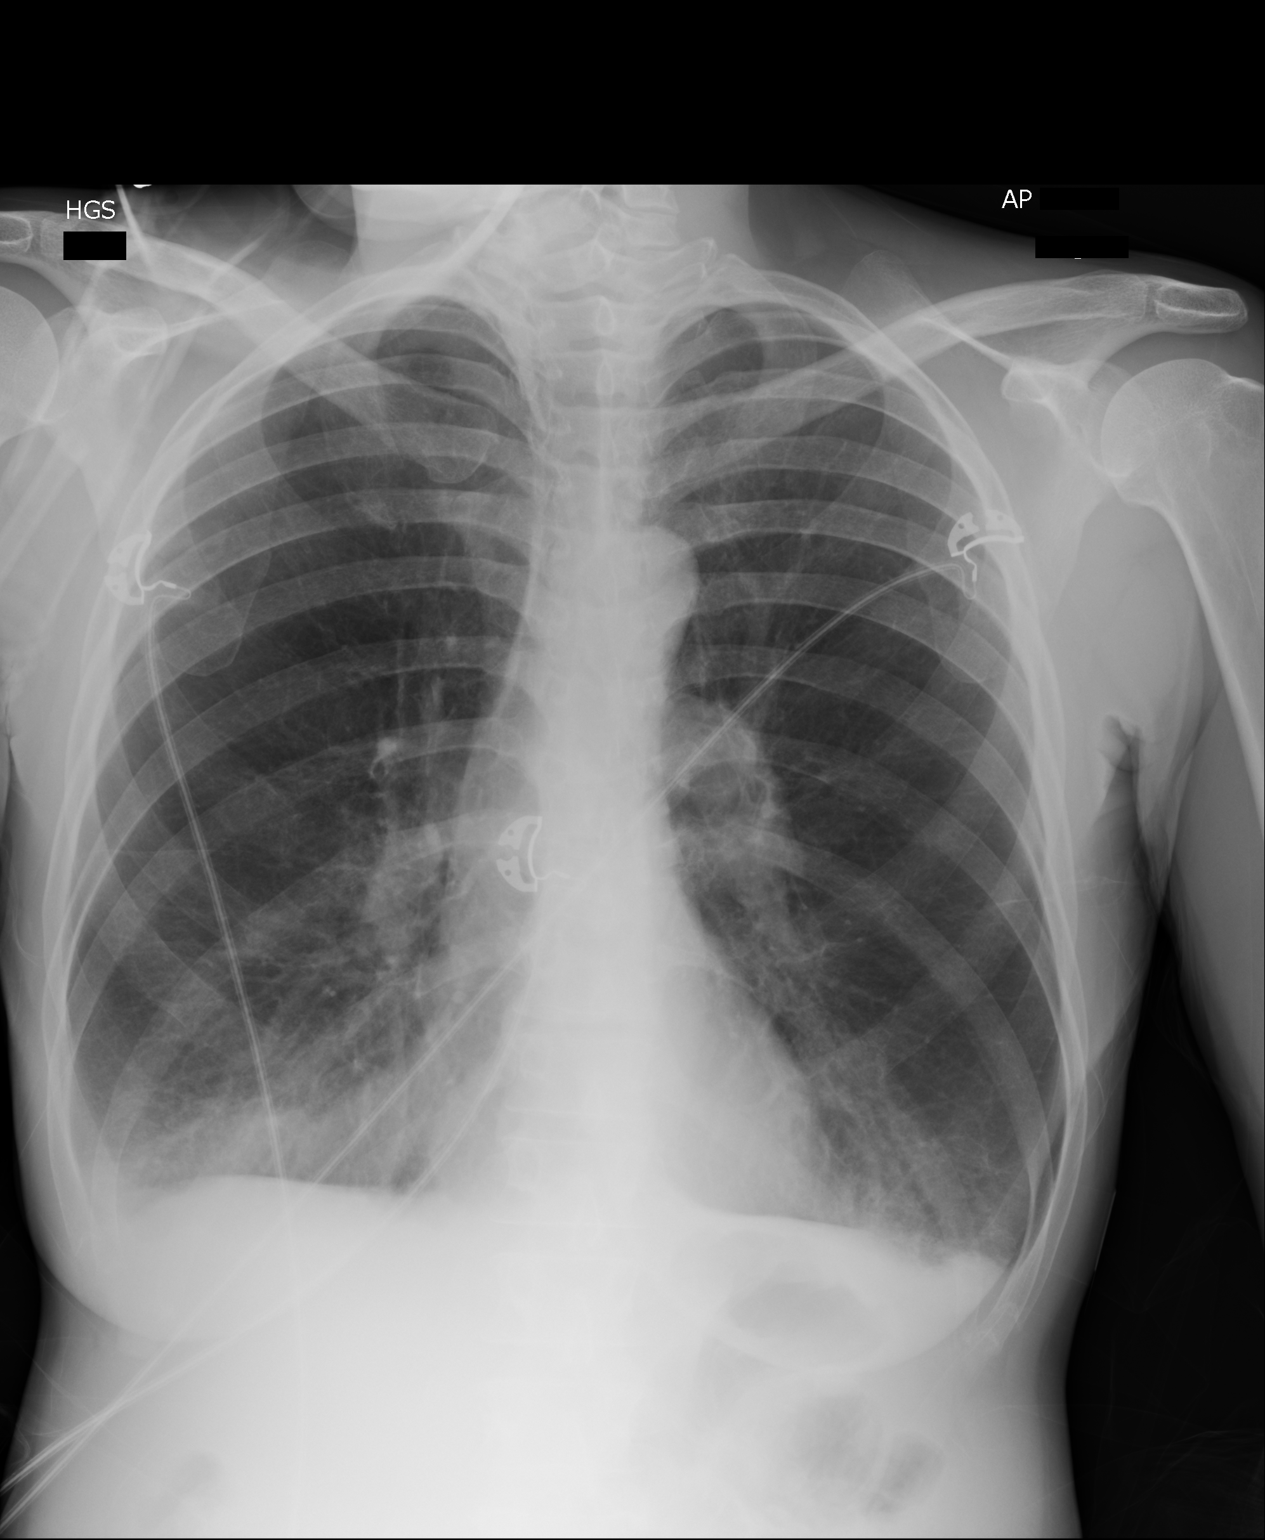

[1 of 1 positions shown; findings below may reference images not displayed]

FINDINGS: Cardiac silhouette normal in size, unchanged. Prominent central
pulmonary arteries, left greater than right, unchanged.
Emphysematous changes throughout both lungs with hyperinflation.
Interval development of patchy airspace opacities in both lung
bases, right greater than left, associated with small bilateral
pleural effusions.
IMPRESSION: Bibasilar pneumonia superimposed upon COPD/emphysema with associated
small bilateral pleural effusions.

## 2016-02-07 ENCOUNTER — Inpatient Hospital Stay
Admission: EM | Admit: 2016-02-07 | Discharge: 2016-02-10 | DRG: 189 | Disposition: A | Discharge status: Home / Self Care | Payer: Medicaid Other | Attending: Internal Medicine | Admitting: Internal Medicine

## 2016-02-07 ENCOUNTER — Emergency Department: Payer: Medicaid Other

## 2016-02-07 DIAGNOSIS — Z8249 Family history of ischemic heart disease and other diseases of the circulatory system: Secondary | ICD-10-CM | POA: Diagnosis not present

## 2016-02-07 DIAGNOSIS — Z7982 Long term (current) use of aspirin: Secondary | ICD-10-CM | POA: Diagnosis not present

## 2016-02-07 DIAGNOSIS — J441 Chronic obstructive pulmonary disease with (acute) exacerbation: Secondary | ICD-10-CM | POA: Diagnosis present

## 2016-02-07 DIAGNOSIS — F419 Anxiety disorder, unspecified: Secondary | ICD-10-CM | POA: Diagnosis present

## 2016-02-07 DIAGNOSIS — J9622 Acute and chronic respiratory failure with hypercapnia: Secondary | ICD-10-CM

## 2016-02-07 DIAGNOSIS — J9621 Acute and chronic respiratory failure with hypoxia: Principal | ICD-10-CM | POA: Diagnosis present

## 2016-02-07 DIAGNOSIS — M1611 Unilateral primary osteoarthritis, right hip: Secondary | ICD-10-CM | POA: Diagnosis present

## 2016-02-07 DIAGNOSIS — E872 Acidosis: Secondary | ICD-10-CM | POA: Diagnosis present

## 2016-02-07 DIAGNOSIS — D1723 Benign lipomatous neoplasm of skin and subcutaneous tissue of right leg: Secondary | ICD-10-CM | POA: Diagnosis present

## 2016-02-07 DIAGNOSIS — F17210 Nicotine dependence, cigarettes, uncomplicated: Secondary | ICD-10-CM | POA: Diagnosis present

## 2016-02-07 DIAGNOSIS — R52 Pain, unspecified: Secondary | ICD-10-CM

## 2016-02-07 DIAGNOSIS — Z823 Family history of stroke: Secondary | ICD-10-CM | POA: Diagnosis not present

## 2016-02-07 DIAGNOSIS — E7800 Pure hypercholesterolemia, unspecified: Secondary | ICD-10-CM | POA: Diagnosis present

## 2016-02-07 DIAGNOSIS — Z79899 Other long term (current) drug therapy: Secondary | ICD-10-CM | POA: Diagnosis not present

## 2016-02-07 DIAGNOSIS — I2510 Atherosclerotic heart disease of native coronary artery without angina pectoris: Secondary | ICD-10-CM | POA: Diagnosis present

## 2016-02-07 DIAGNOSIS — J209 Acute bronchitis, unspecified: Secondary | ICD-10-CM | POA: Diagnosis present

## 2016-02-07 DIAGNOSIS — M25551 Pain in right hip: Secondary | ICD-10-CM

## 2016-02-07 DIAGNOSIS — E876 Hypokalemia: Secondary | ICD-10-CM | POA: Diagnosis present

## 2016-02-07 DIAGNOSIS — I110 Hypertensive heart disease with heart failure: Secondary | ICD-10-CM | POA: Diagnosis present

## 2016-02-07 DIAGNOSIS — Z9981 Dependence on supplemental oxygen: Secondary | ICD-10-CM

## 2016-02-07 DIAGNOSIS — J440 Chronic obstructive pulmonary disease with acute lower respiratory infection: Secondary | ICD-10-CM | POA: Diagnosis present

## 2016-02-07 DIAGNOSIS — Z801 Family history of malignant neoplasm of trachea, bronchus and lung: Secondary | ICD-10-CM | POA: Diagnosis not present

## 2016-02-07 DIAGNOSIS — I5032 Chronic diastolic (congestive) heart failure: Secondary | ICD-10-CM | POA: Diagnosis present

## 2016-02-07 DIAGNOSIS — J9600 Acute respiratory failure, unspecified whether with hypoxia or hypercapnia: Secondary | ICD-10-CM | POA: Diagnosis present

## 2016-02-07 LAB — BLOOD GAS, VENOUS
PCO2 VEN: 76 mmHg — AB (ref 44.0–60.0)
PH VEN: 7.29 (ref 7.250–7.430)
Patient temperature: 37

## 2016-02-07 LAB — URINALYSIS COMPLETE WITH MICROSCOPIC (ARMC ONLY)
BACTERIA UA: NONE SEEN
BILIRUBIN URINE: NEGATIVE
Bilirubin Urine: NEGATIVE
GLUCOSE, UA: NEGATIVE mg/dL
Glucose, UA: NEGATIVE mg/dL
HGB URINE DIPSTICK: NEGATIVE
Hgb urine dipstick: NEGATIVE
KETONES UR: NEGATIVE mg/dL
Ketones, ur: NEGATIVE mg/dL
Leukocytes, UA: NEGATIVE
NITRITE: NEGATIVE
Nitrite: NEGATIVE
PH: 7 (ref 5.0–8.0)
PROTEIN: NEGATIVE mg/dL
Protein, ur: NEGATIVE mg/dL
RBC / HPF: NONE SEEN RBC/hpf (ref 0–5)
SPECIFIC GRAVITY, URINE: 1.013 (ref 1.005–1.030)
SPECIFIC GRAVITY, URINE: 1.011 (ref 1.005–1.030)
pH: 7 (ref 5.0–8.0)

## 2016-02-07 LAB — CBC WITH DIFFERENTIAL/PLATELET
Basophils Absolute: 0.1 10*3/uL (ref 0–0.1)
Basophils Relative: 1 %
EOS ABS: 0.1 10*3/uL (ref 0–0.7)
EOS PCT: 2 %
HEMATOCRIT: 39.4 % (ref 35.0–47.0)
HEMOGLOBIN: 13.2 g/dL (ref 12.0–16.0)
LYMPHS ABS: 1.3 10*3/uL (ref 1.0–3.6)
LYMPHS PCT: 22 %
MCH: 29.2 pg (ref 26.0–34.0)
MCHC: 33.4 g/dL (ref 32.0–36.0)
MCV: 87.3 fL (ref 80.0–100.0)
MONOS PCT: 11 %
Monocytes Absolute: 0.7 10*3/uL (ref 0.2–0.9)
Neutro Abs: 3.9 10*3/uL (ref 1.4–6.5)
Neutrophils Relative %: 64 %
Platelets: 216 10*3/uL (ref 150–440)
RBC: 4.52 MIL/uL (ref 3.80–5.20)
RDW: 13.5 % (ref 11.5–14.5)
WBC: 6.1 10*3/uL (ref 3.6–11.0)

## 2016-02-07 LAB — BASIC METABOLIC PANEL
ANION GAP: 4 — AB (ref 5–15)
BUN: 24 mg/dL — ABNORMAL HIGH (ref 6–20)
CHLORIDE: 104 mmol/L (ref 101–111)
CO2: 32 mmol/L (ref 22–32)
Calcium: 9 mg/dL (ref 8.9–10.3)
Creatinine, Ser: 0.66 mg/dL (ref 0.44–1.00)
GFR calc non Af Amer: >60 mL/min (ref >60)
Glucose, Bld: 103 mg/dL — ABNORMAL HIGH (ref 65–99)
Potassium: 3.4 mmol/L — ABNORMAL LOW (ref 3.5–5.1)
Sodium: 140 mmol/L (ref 135–145)

## 2016-02-07 LAB — TROPONIN I: Troponin I: <0.03 ng/mL (ref <0.03)

## 2016-02-07 LAB — GLUCOSE, CAPILLARY: Glucose-Capillary: 194 mg/dL — ABNORMAL HIGH (ref 65–99)

## 2016-02-07 LAB — MRSA PCR SCREENING: MRSA BY PCR: POSITIVE — AB

## 2016-02-07 MED ORDER — MAGNESIUM SULFATE IN D5W 1-5 GM/100ML-% IV SOLN
1.0000 g | Freq: Once | INTRAVENOUS | Status: AC
  Administered 2016-02-07: 1 g via INTRAVENOUS
  Filled 2016-02-07: qty 100

## 2016-02-07 MED ORDER — NICOTINE 14 MG/24HR TD PT24
14.0000 mg | MEDICATED_PATCH | Freq: Every day | TRANSDERMAL | Status: DC
  Administered 2016-02-07 – 2016-02-10 (×4): 14 mg via TRANSDERMAL
  Filled 2016-02-07 (×4): qty 1

## 2016-02-07 MED ORDER — SODIUM CHLORIDE 0.9 % IV BOLUS (SEPSIS)
500.0000 mL | Freq: Once | INTRAVENOUS | Status: AC
  Administered 2016-02-07: 500 mL via INTRAVENOUS

## 2016-02-07 MED ORDER — AZITHROMYCIN 500 MG PO TABS
500.0000 mg | ORAL_TABLET | Freq: Every day | ORAL | Status: DC
  Administered 2016-02-07 – 2016-02-08 (×2): 500 mg via ORAL
  Filled 2016-02-07 (×2): qty 1

## 2016-02-07 MED ORDER — CHLORHEXIDINE GLUCONATE CLOTH 2 % EX PADS
6.0000 | MEDICATED_PAD | Freq: Every day | CUTANEOUS | Status: DC
  Administered 2016-02-08 – 2016-02-10 (×3): 6 via TOPICAL

## 2016-02-07 MED ORDER — ACETAMINOPHEN 325 MG PO TABS
650.0000 mg | ORAL_TABLET | Freq: Four times a day (QID) | ORAL | Status: DC | PRN
  Administered 2016-02-08 – 2016-02-09 (×2): 650 mg via ORAL
  Filled 2016-02-07 (×2): qty 2

## 2016-02-07 MED ORDER — ENALAPRIL MALEATE 5 MG PO TABS
5.0000 mg | ORAL_TABLET | Freq: Every day | ORAL | Status: DC
  Administered 2016-02-07 – 2016-02-10 (×4): 5 mg via ORAL
  Filled 2016-02-07 (×4): qty 1

## 2016-02-07 MED ORDER — ASPIRIN EC 81 MG PO TBEC
81.0000 mg | DELAYED_RELEASE_TABLET | Freq: Every day | ORAL | Status: DC
  Administered 2016-02-08 – 2016-02-10 (×3): 81 mg via ORAL
  Filled 2016-02-07 (×3): qty 1

## 2016-02-07 MED ORDER — ENOXAPARIN SODIUM 40 MG/0.4ML ~~LOC~~ SOLN
40.0000 mg | SUBCUTANEOUS | Status: DC
  Administered 2016-02-07: 40 mg via SUBCUTANEOUS
  Filled 2016-02-07: qty 0.4

## 2016-02-07 MED ORDER — MUPIROCIN 2 % EX OINT
1.0000 "application " | TOPICAL_OINTMENT | Freq: Two times a day (BID) | CUTANEOUS | Status: DC
  Administered 2016-02-07 – 2016-02-10 (×6): 1 via NASAL
  Filled 2016-02-07: qty 22

## 2016-02-07 MED ORDER — CARVEDILOL 6.25 MG PO TABS
3.1250 mg | ORAL_TABLET | ORAL | Status: DC
  Administered 2016-02-08 – 2016-02-10 (×3): 3.125 mg via ORAL
  Filled 2016-02-07 (×3): qty 1

## 2016-02-07 MED ORDER — METHYLPREDNISOLONE SODIUM SUCC 125 MG IJ SOLR
60.0000 mg | Freq: Once | INTRAMUSCULAR | Status: AC
  Administered 2016-02-07: 60 mg via INTRAVENOUS
  Filled 2016-02-07: qty 2

## 2016-02-07 MED ORDER — POTASSIUM CHLORIDE CRYS ER 20 MEQ PO TBCR
40.0000 meq | EXTENDED_RELEASE_TABLET | Freq: Once | ORAL | Status: AC
  Administered 2016-02-07: 40 meq via ORAL
  Filled 2016-02-07: qty 2

## 2016-02-07 MED ORDER — ONDANSETRON HCL 4 MG/2ML IJ SOLN: 4.0000 mg | Freq: Four times a day (QID) | INTRAMUSCULAR | Status: DC | PRN

## 2016-02-07 MED ORDER — DEXTROSE 5 % IV SOLN
1.0000 g | INTRAVENOUS | Status: DC
  Administered 2016-02-07 – 2016-02-09 (×3): 1 g via INTRAVENOUS
  Filled 2016-02-07 (×3): qty 10

## 2016-02-07 MED ORDER — METHYLPREDNISOLONE SODIUM SUCC 125 MG IJ SOLR
60.0000 mg | Freq: Four times a day (QID) | INTRAMUSCULAR | Status: DC
  Administered 2016-02-07 – 2016-02-09 (×7): 60 mg via INTRAVENOUS
  Filled 2016-02-07 (×7): qty 2

## 2016-02-07 MED ORDER — IPRATROPIUM-ALBUTEROL 0.5-2.5 (3) MG/3ML IN SOLN
3.0000 mL | Freq: Once | RESPIRATORY_TRACT | Status: AC
  Administered 2016-02-07: 3 mL via RESPIRATORY_TRACT
  Filled 2016-02-07: qty 3

## 2016-02-07 MED ORDER — ONDANSETRON HCL 4 MG PO TABS: 4.0000 mg | ORAL_TABLET | Freq: Four times a day (QID) | ORAL | Status: DC | PRN

## 2016-02-07 MED ORDER — GUAIFENESIN-DM 100-10 MG/5ML PO SYRP
10.0000 mL | ORAL_SOLUTION | Freq: Three times a day (TID) | ORAL | Status: DC | PRN
  Administered 2016-02-08 – 2016-02-09 (×2): 10 mL via ORAL
  Filled 2016-02-07 (×2): qty 10

## 2016-02-07 MED ORDER — ENSURE ENLIVE PO LIQD
237.0000 mL | Freq: Two times a day (BID) | ORAL | Status: DC
  Administered 2016-02-08 – 2016-02-10 (×5): 237 mL via ORAL

## 2016-02-07 MED ORDER — ACETAMINOPHEN 650 MG RE SUPP: 650.0000 mg | Freq: Four times a day (QID) | RECTAL | Status: DC | PRN

## 2016-02-07 MED ORDER — DOCUSATE SODIUM 100 MG PO CAPS
100.0000 mg | ORAL_CAPSULE | Freq: Two times a day (BID) | ORAL | Status: DC
Start: 2016-02-07 — End: 2016-02-10
  Administered 2016-02-07 – 2016-02-10 (×6): 100 mg via ORAL
  Filled 2016-02-07 (×6): qty 1

## 2016-02-07 MED ORDER — LORAZEPAM 1 MG PO TABS
1.0000 mg | ORAL_TABLET | Freq: Two times a day (BID) | ORAL | Status: DC | PRN
  Administered 2016-02-07 – 2016-02-09 (×4): 1 mg via ORAL
  Filled 2016-02-07 (×2): qty 1
  Filled 2016-02-07 (×2): qty 2

## 2016-02-07 MED ORDER — IPRATROPIUM-ALBUTEROL 0.5-2.5 (3) MG/3ML IN SOLN
3.0000 mL | RESPIRATORY_TRACT | Status: DC
  Administered 2016-02-07 – 2016-02-09 (×13): 3 mL via RESPIRATORY_TRACT
  Filled 2016-02-07 (×13): qty 3

## 2016-02-07 MED ORDER — ALBUTEROL SULFATE (2.5 MG/3ML) 0.083% IN NEBU
5.0000 mg | INHALATION_SOLUTION | Freq: Once | RESPIRATORY_TRACT | Status: AC
  Administered 2016-02-07: 5 mg via RESPIRATORY_TRACT
  Filled 2016-02-07: qty 6

## 2016-02-07 MED ORDER — MOMETASONE FURO-FORMOTEROL FUM 200-5 MCG/ACT IN AERO
2.0000 | INHALATION_SPRAY | Freq: Two times a day (BID) | RESPIRATORY_TRACT | Status: DC
  Administered 2016-02-07 – 2016-02-10 (×6): 2 via RESPIRATORY_TRACT
  Filled 2016-02-07: qty 8.8

## 2016-02-07 NOTE — ED Triage Notes (Signed)
Pt comes into the ED via EMS from home with c/o increased SOB for the past 4 days with hx of COPD on home O2 1L [place] at night up to 2-3L during the day.Michelle [name]

## 2016-02-07 NOTE — ED Provider Notes (Signed)
[facility] Emergency Department Provider Note  ____________________________________________  Time seen: Approximately 3:28 PM  I have reviewed the triage vital signs and the nursing notes.   HISTORY  Chief Complaint Shortness of Breath    HPI SIEANNA [name] is a 55 y.o. female who complains of shortness of breath is been worsening for the past 4 days. No cough or fever. Not exertional. Worsened by the hot weather recently. Got much worse this morning was unbearable. She tried using her albuterol at home without relief. She frequently has hospitalizations due to COPD requiring noninvasive positive pressure ventilation. She does not have access to CPAP at home. She has some chest tightness due to the shortness of breath but no specific chest pain.Denies recent illness or trauma. Denies drug use or smoking.     Past Medical History:  Diagnosis Date  . Asthma   . CHF (congestive heart failure) ([place])   . Cocaine abuse   . COPD (chronic obstructive pulmonary disease) ([place])   . Coronary artery disease   . Emphysema/COPD ([place])   . High cholesterol   . Hypertension   . Pneumothorax   . Tobacco abuse      Patient Active Problem List   Diagnosis Date Noted  . Acute on chronic respiratory failure with hypercapnia ([place]) 11/29/2015  . Malnutrition of moderate degree ([place]) 10/25/2014  . COPD exacerbation ([place]) 10/24/2014  . Acute respiratory failure with hypoxia ([place]) 10/24/2014  . Tobacco abuse 10/24/2014  . Community acquired pneumonia 10/24/2014     Past Surgical History:  Procedure Laterality Date  . CARDIAC CATHETERIZATION    . CHEST TUBE INSERTION       Prior to Admission medications   Medication Sig Start Date End Date Taking? Authorizing Provider  albuterol (PROVENTIL HFA;VENTOLIN HFA) 108 (90 Base) MCG/ACT inhaler Inhale 2 puffs into the lungs every 6 (six) hours as needed for wheezing or shortness of breath. 11/07/15   Hillary Bow, MD   aspirin EC 81 MG tablet Take 81 mg by mouth daily.    Historical Provider, MD  carvedilol (COREG) 3.125 MG tablet Take 1 tablet (3.125 mg total) by mouth every morning. 12/01/15   Bettey Costa, MD  enalapril (VASOTEC) 10 MG tablet Take 0.5 tablets (5 mg total) by mouth daily. 12/01/15   Bettey Costa, MD  feeding supplement, ENSURE ENLIVE, (ENSURE ENLIVE) LIQD Take 237 mLs by mouth 2 (two) times daily between meals. 10/27/14   Nicholes Mango, MD  Fluticasone-Salmeterol (ADVAIR) 250-50 MCG/DOSE AEPB Inhale 1 puff into the lungs 2 (two) times daily.    Historical Provider, MD  guaiFENesin-dextromethorphan (ROBITUSSIN DM) 100-10 MG/5ML syrup Take 10 mLs by mouth every 8 (eight) hours as needed for cough. 10/27/14   Nicholes Mango, MD  ipratropium-albuterol (DUONEB) 0.5-2.5 (3) MG/3ML SOLN Take 3 mLs by nebulization every 4 (four) hours as needed. 11/07/15   Srikar Sudini, MD  levofloxacin (LEVAQUIN) 250 MG tablet Take 1 tablet (250 mg total) by mouth daily. 12/01/15   Bettey Costa, MD  LORazepam (ATIVAN) 0.5 MG tablet Take 2 tablets (1 mg total) by mouth 2 (two) times daily as needed for anxiety. 12/01/15   Bettey Costa, MD  nicotine (NICODERM CQ - DOSED IN MG/24 HOURS) 14 mg/24hr patch Place 1 patch (14 mg total) onto the skin daily. 12/01/15   Bettey Costa, MD  predniSONE (DELTASONE) 10 MG tablet Take 1 tablet (10 mg total) by mouth daily with breakfast. 60 mg PO (ORAL) x 2 days 50 mg PO (  ORAL)  x 2 days 40 mg PO (ORAL)  x 2 days 30 mg PO  (ORAL)  x 2 days 20 mg PO  (ORAL) x 2 days 10 mg PO  (ORAL) x 2 days then stop 12/01/15   Bettey Costa, MD  simvastatin (ZOCOR) 20 MG tablet Take 20 mg by mouth at bedtime. 10/24/15 11/29/15  Historical Provider, MD  tiotropium (SPIRIVA) 18 MCG inhalation capsule Place 18 mcg into inhaler and inhale daily.    Historical Provider, MD     Allergies Review of patient's allergies indicates no known allergies.   Family History  Problem Relation Age of Onset  . Lung cancer Mother   . CVA  Father   . CAD Father   . Breast cancer Maternal Aunt 67    Social History Social History  Substance Use Topics  . Smoking status: Current Every Day Smoker    Packs/day: 0.50    Types: Cigarettes  . Smokeless tobacco: Never Used  . Alcohol use No    Review of Systems  Constitutional:   No fever or chills.  ENT:   No sore throat. No rhinorrhea. Cardiovascular:   Positive as above chest pain. Respiratory:   Positive shortness of breath without cough. Gastrointestinal:   Negative for abdominal pain, vomiting and diarrhea.  Genitourinary:   Negative for dysuria or difficulty urinating. Musculoskeletal:   Negative for focal pain or swelling Neurological:   Negative for headaches 10-point ROS otherwise negative.  ____________________________________________   PHYSICAL EXAM:  VITAL SIGNS: ED Triage Vitals  Enc Vitals Group     BP      Pulse      Resp      Temp      Temp src      SpO2      Weight      Height      Head Circumference      Peak Flow      Pain Score      Pain Loc      Pain Edu?      Excl. in [place]?     Vital signs reviewed, nursing assessments reviewed.   Constitutional:   Alert and oriented. In mild respiratory distress. Eyes:   No scleral icterus. No conjunctival pallor. PERRL. EOMI.  No nystagmus. ENT   Head:   Normocephalic and atraumatic.   Nose:   No congestion/rhinnorhea. No septal hematoma   Mouth/Throat:   Dry mucous membranes, no pharyngeal erythema. No peritonsillar mass.    Neck:   No stridor. No SubQ emphysema. No meningismus. Hematological/Lymphatic/Immunilogical:   No cervical lymphadenopathy. Cardiovascular:   RRR. Symmetric bilateral radial and DP pulses.  No murmurs.  Respiratory:   Tachypnea. Accessory muscle use. Poor air movement in all lung fields, diminished on the left compared to the right. No crackles or wheezes. Gastrointestinal:   Soft and nontender. Non distended. There is no CVA tenderness.  No rebound,  rigidity, or guarding. Genitourinary:   deferred Musculoskeletal:   Nontender with normal range of motion in all extremities. No joint effusions.  No lower extremity tenderness.  No edema. Neurologic:   Normal speech and language.  CN 2-10 normal. Motor grossly intact. No gross focal neurologic deficits are appreciated.  Skin:    Skin is warm, dry and intact. No rash noted.  No petechiae, purpura, or bullae.  ____________________________________________    LABS (pertinent positives/negatives) (all labs ordered are listed, but only abnormal results are displayed) Labs Reviewed  BASIC METABOLIC PANEL -  Abnormal; Notable for the following:       Result Value   Potassium 3.4 (*)    Glucose, Bld 103 (*)    BUN 24 (*)    Anion gap 4 (*)    All other components within normal limits  BLOOD GAS, VENOUS - Abnormal; Notable for the following:    pCO2, Ven 76 (*)    pO2, Ven <31.0 (*)    All other components within normal limits  URINALYSIS COMPLETEWITH MICROSCOPIC ([facility] ONLY) - Abnormal; Notable for the following:    Color, Urine YELLOW (*)    APPearance CLOUDY (*)    Squamous Epithelial / LPF 0-5 (*)    All other components within normal limits  CBC WITH DIFFERENTIAL/PLATELET  TROPONIN I   ____________________________________________   EKG  Interpreted by me Sinus rhythm rate of 76, normal axis and intervals. Right bundle-branch block. No acute ischemic changes.  ____________________________________________    RADIOLOGY  Chest x-ray does not reveal any acute changes. No pneumothorax or evidence of pneumonia  ____________________________________________   PROCEDURES Procedures CRITICAL CARE Performed by: Joni Fears, Floreine Kingdon   Total critical care time: 35 minutes  Critical care time was exclusive of separately billable procedures and treating other patients.  Critical care was necessary to treat or prevent imminent or life-threatening deterioration.  Critical care  was time spent personally by me on the following activities: development of treatment plan with patient and/or surrogate as well as nursing, discussions with consultants, evaluation of patient's response to treatment, examination of patient, obtaining history from patient or surrogate, ordering and performing treatments and interventions, ordering and review of laboratory studies, ordering and review of radiographic studies, pulse oximetry and re-evaluation of patient's condition.  ____________________________________________   INITIAL IMPRESSION / ASSESSMENT AND PLAN / ED COURSE  Pertinent labs & imaging results that were available during my care of the patient were reviewed by me and considered in my medical decision making (see chart for details).  COPD exacerbation. Unlikely TAD or PE. I highly doubt ACS, but due to history of cocaine abuse and some chest tightness I'll send a troponin. DuoNeb's IV fluids magnesium steroids. Reassess response to treatment, anticipate the patient will likely require hospitalization for prolonged treatment course to stabilize.    ----------------------------------------- 4:49 PM on 02/07/2016 -----------------------------------------  Workup reveals respiratory acidosis, hypercarbia. Patient reports feeling worse despite treatment with bronchodilators steroids and magnesium. Patient started on BiPAP with some relief of symptoms. Oxygenation is very good, 100% on 35% FiO2. We'll discussed with hospitalist for admission.   Clinical Course   ____________________________________________   FINAL CLINICAL IMPRESSION(S) / ED DIAGNOSES  Final diagnoses:  COPD exacerbation ([place])  Acute on chronic respiratory failure with hypercapnia ([place])       Portions of this note were generated with dragon dictation software. Dictation errors may occur despite best attempts at proofreading.    Carrie Mew, MD 02/07/16 [phone]

## 2016-02-07 NOTE — H&P (Signed)
[place] [facility] [place] NAME: Michelle [name]    MR#:  [id number]  DATE OF BIRTH:  12/07/1960  DATE OF ADMISSION:  02/07/2016  PRIMARY CARE PHYSICIAN: No primary care provider on file.   REQUESTING/REFERRING PHYSICIAN: Dr. Carrie Mew  CHIEF COMPLAINT:   Chief Complaint  Patient presents with  . Shortness of Breath    HISTORY OF PRESENT ILLNESS:  Michelle [name]  is a 55 y.o. female with a known history of Chronic respiratory failure secondary to stage IV COPD on 2 L home oxygen, diastolic CHF, CAD, hypertension, ongoing smoking presents to the hospital secondary to worsening shortness of breath. Patient says her symptoms started about 4 days ago. She tried using nebulizer without much benefit. She denies any sick contacts, no recent travel. No recent allergy symptoms. She is also having cough which has recently turned to be productive. Today she felt that she couldn't breathe at all, so called EMS. She was very tachypneic and hypoxic when she presented. Started on BiPAP in the emergency room. Now feels better after the BiPAP. Chest x-ray with mild bibasilar atelectasis.  PAST MEDICAL HISTORY:   Past Medical History:  Diagnosis Date  . Asthma   . CHF (congestive heart failure) ([place])   . Cocaine abuse   . COPD (chronic obstructive pulmonary disease) ([place])   . Coronary artery disease   . Emphysema/COPD ([place])   . High cholesterol   . Hypertension   . Pneumothorax   . Tobacco abuse     PAST SURGICAL HISTORY:   Past Surgical History:  Procedure Laterality Date  . CARDIAC CATHETERIZATION    . CHEST TUBE INSERTION      SOCIAL HISTORY:   Social History  Substance Use Topics  . Smoking status: Current Every Day Smoker    Packs/day: 0.50    Types: Cigarettes  . Smokeless tobacco: Never Used  . Alcohol use No    FAMILY HISTORY:   Family History  Problem Relation Age of Onset  . Lung cancer Mother   . CVA Father   . CAD Father    . Breast cancer Maternal Aunt 67    DRUG ALLERGIES:  No Known Allergies  REVIEW OF SYSTEMS:   Review of Systems  Constitutional: Positive for chills and malaise/fatigue. Negative for fever and weight loss.  HENT: Negative for ear discharge, ear pain, hearing loss, nosebleeds and tinnitus.   Eyes: Negative for blurred vision, double vision and photophobia.  Respiratory: Positive for cough, shortness of breath and wheezing. Negative for hemoptysis.   Cardiovascular: Negative for chest pain, palpitations, orthopnea and leg swelling.  Gastrointestinal: Positive for nausea. Negative for abdominal pain, constipation, diarrhea, heartburn, melena and vomiting.  Genitourinary: Positive for dysuria and urgency. Negative for frequency and hematuria.  Musculoskeletal: Negative for back pain, myalgias and neck pain.  Skin: Negative for rash.  Neurological: Negative for dizziness, tingling, tremors, sensory change, speech change, focal weakness and headaches.  Endo/Heme/Allergies: Does not bruise/bleed easily.  Psychiatric/Behavioral: Negative for depression.    MEDICATIONS AT HOME:   Prior to Admission medications   Medication Sig Start Date End Date Taking? Authorizing Provider  albuterol (PROVENTIL HFA;VENTOLIN HFA) 108 (90 Base) MCG/ACT inhaler Inhale 2 puffs into the lungs every 6 (six) hours as needed for wheezing or shortness of breath. 11/07/15   Hillary Bow, MD  aspirin EC 81 MG tablet Take 81 mg by mouth daily.    Historical Provider, MD  carvedilol (COREG) 3.125 MG  tablet Take 1 tablet (3.125 mg total) by mouth every morning. 12/01/15   Bettey Costa, MD  enalapril (VASOTEC) 10 MG tablet Take 0.5 tablets (5 mg total) by mouth daily. 12/01/15   Bettey Costa, MD  feeding supplement, ENSURE ENLIVE, (ENSURE ENLIVE) LIQD Take 237 mLs by mouth 2 (two) times daily between meals. 10/27/14   Nicholes Mango, MD  Fluticasone-Salmeterol (ADVAIR) 250-50 MCG/DOSE AEPB Inhale 1 puff into the lungs 2 (two) times  daily.    Historical Provider, MD  guaiFENesin-dextromethorphan (ROBITUSSIN DM) 100-10 MG/5ML syrup Take 10 mLs by mouth every 8 (eight) hours as needed for cough. 10/27/14   Nicholes Mango, MD  ipratropium-albuterol (DUONEB) 0.5-2.5 (3) MG/3ML SOLN Take 3 mLs by nebulization every 4 (four) hours as needed. 11/07/15   Srikar Sudini, MD  levofloxacin (LEVAQUIN) 250 MG tablet Take 1 tablet (250 mg total) by mouth daily. 12/01/15   Bettey Costa, MD  LORazepam (ATIVAN) 0.5 MG tablet Take 2 tablets (1 mg total) by mouth 2 (two) times daily as needed for anxiety. 12/01/15   Bettey Costa, MD  nicotine (NICODERM CQ - DOSED IN MG/24 HOURS) 14 mg/24hr patch Place 1 patch (14 mg total) onto the skin daily. 12/01/15   Bettey Costa, MD  predniSONE (DELTASONE) 10 MG tablet Take 1 tablet (10 mg total) by mouth daily with breakfast. 60 mg PO (ORAL) x 2 days 50 mg PO (ORAL)  x 2 days 40 mg PO (ORAL)  x 2 days 30 mg PO  (ORAL)  x 2 days 20 mg PO  (ORAL) x 2 days 10 mg PO  (ORAL) x 2 days then stop 12/01/15   Bettey Costa, MD  simvastatin (ZOCOR) 20 MG tablet Take 20 mg by mouth at bedtime. 10/24/15 11/29/15  Historical Provider, MD  tiotropium (SPIRIVA) 18 MCG inhalation capsule Place 18 mcg into inhaler and inhale daily.    Historical Provider, MD      VITAL SIGNS:  Blood pressure 138/90, pulse 65, temperature 99 F (37.2 C), temperature source Oral, resp. rate 15, height 4\' 9"  (1.448 m), weight 35.8 kg (79 lb), last menstrual period 03/06/2005, SpO2 100 %.  PHYSICAL EXAMINATION:   Physical Exam  GENERAL:  55 y.o.-year-old cachectic patient sitting in the bed, on Bipap now.  EYES: Pupils equal, round, reactive to light and accommodation. No scleral icterus. Extraocular muscles intact.  HEENT: Head atraumatic, normocephalic. Oropharynx and nasopharynx clear.  NECK:  Supple, no jugular venous distention. No thyroid enlargement, no tenderness.  LUNGS: Tight breath sounds bilaterally, scattered expiratory wheezing,no  rales,rhonchi or crepitation. No use of accessory muscles of respiration now.  CARDIOVASCULAR: S1, S2 normal. No murmurs, rubs, or gallops.  ABDOMEN: Soft, nontender, nondistended. Bowel sounds present. No organomegaly or mass.  EXTREMITIES: No pedal edema, cyanosis, or clubbing.  NEUROLOGIC: Cranial nerves II through XII are intact. Muscle strength 5/5 in all extremities. Sensation intact. Gait not checked.  PSYCHIATRIC: The patient is alert and oriented x 3.  SKIN: No obvious rash, lesion, or ulcer.   LABORATORY PANEL:   CBC  Recent Labs Lab 02/07/16 1537  WBC 6.1  HGB 13.2  HCT 39.4  PLT 216   ------------------------------------------------------------------------------------------------------------------  Chemistries   Recent Labs Lab 02/07/16 1537  NA 140  K 3.4*  CL 104  CO2 32  GLUCOSE 103*  BUN 24*  CREATININE 0.66  CALCIUM 9.0   ------------------------------------------------------------------------------------------------------------------  Cardiac Enzymes  Recent Labs Lab 02/07/16 1537  TROPONINI <0.03   ------------------------------------------------------------------------------------------------------------------  RADIOLOGY:  Dg Chest Portable  1 View  Result Date: 02/07/2016 CLINICAL DATA:  Difficulty breathing. EXAM: PORTABLE CHEST 1 VIEW COMPARISON:  CT 12/01/2015.  Chest x-ray 11/30/2015. FINDINGS: Mediastinum and hilar structures are normal. Mild bibasilar atelectasis and/or scarring. Pleural parenchymal thickening again noted consistent with scarring . COPD cannot be excluded. Heart size stable. No acute bony abnormality P IMPRESSION: Mild bibasilar subsegmental atelectasis and/or scarring. Pleural parenchymal scarring. COPD cannot be excluded. No acute pulmonary abnormality identified. Electronically Signed   By: Marcello Moores  Register   On: 02/07/2016 15:42    EKG:   Orders placed or performed during the hospital encounter of 02/07/16  . ED EKG   . ED EKG    IMPRESSION AND PLAN:   Michelle [name]  is a 55 y.o. female with a known history of Chronic respiratory failure secondary to stage IV COPD on 2 L home oxygen, diastolic CHF, CAD, hypertension, ongoing smoking presents to the hospital secondary to worsening shortness of breath.  #1 Acute on chronic respiratory failure-hypoxic and hypercarbic. -Secondary to COPD exacerbation and acute bronchitis. -Still remains on BiPAP. Wean as tolerated. - Admit to stepdown unit. - started IV steroids, duonebs and continue inhalers - on rocephin and azithromycin for now -Last hospitalization in June 2017, last prednisone and azithromycin as outpatient in July 2017. Follows with pulmonologist at [facility]. Finished pulmonary rehabilitation in the past. - ECHO with mild pulmonary hypertension and diastolic dysfunction, EF [id number]  #2 HTN- on coreg and enalapril  #3 anxiety-continue Ativan  #4 tobacco use disorder-cutting down slowly. On nicotine patch  #5 DVT prophylaxis-on Lovenox  #6 Hypokalemia- being replaced.   All the records are reviewed and case discussed with ED provider. Management plans discussed with the patient, family and they are in agreement.  CODE STATUS: Full code  TOTAL CRITICAL CARE TIME SPENT IN TAKING CARE OF THIS PATIENT: 60 minutes.    Gladstone Lighter M.D on 02/07/2016 at 5:15 PM  Between 7am to 6pm - Pager - [phone]  After 6pm go to [email or web address] - password [email or web address] [facility]  Office  [phone]  CC: Primary care physician; No primary care provider on file.

## 2016-02-07 NOTE — Progress Notes (Signed)
Transported pt to [facility] [address]. Pt wanted to come off, placed pt on 3L [place]. Pt tol well, Bipap remains at bedside. Will monitor.

## 2016-02-07 NOTE — ED Notes (Addendum)
Daughter: Gracemarie Yonke [phone]

## 2016-02-07 NOTE — Progress Notes (Signed)
Pharmacy Antibiotic Note  Michelle [name] is a 55 y.o. female admitted on 02/07/2016 with  .  Pharmacy has been consulted for ceftriaxone dosing.  Plan: Ceftriaxone 1 g IV daily  Height: 5\' 1"  (154.9 cm) Weight: 80 lb 0.4 oz (36.3 kg) IBW/kg (Calculated) : 47.8  Temp (24hrs), Avg:98.7 F (37.1 C), Min:98.4 F (36.9 C), Max:99 F (37.2 C)   Recent Labs Lab 02/07/16 1537  WBC 6.1  CREATININE 0.66    Estimated Creatinine Clearance: 46.1 mL/min (by C-G formula based on SCr of 0.8 mg/dL).    No Known Allergies Thank you for allowing pharmacy to be a part of this patient's care.  Darylene Price [facility] 02/07/2016 7:36 PM

## 2016-02-08 ENCOUNTER — Inpatient Hospital Stay: Payer: Medicaid Other

## 2016-02-08 LAB — BASIC METABOLIC PANEL
Anion gap: 7 (ref 5–15)
BUN: 20 mg/dL (ref 6–20)
CALCIUM: 9.2 mg/dL (ref 8.9–10.3)
CO2: 22 mmol/L (ref 22–32)
CREATININE: 0.57 mg/dL (ref 0.44–1.00)
Chloride: 113 mmol/L — ABNORMAL HIGH (ref 101–111)
GFR calc Af Amer: >60 mL/min (ref >60)
GLUCOSE: 188 mg/dL — AB (ref 65–99)
Potassium: 3.8 mmol/L (ref 3.5–5.1)
SODIUM: 142 mmol/L (ref 135–145)

## 2016-02-08 LAB — CBC
HCT: 38.6 % (ref 35.0–47.0)
Hemoglobin: 12.9 g/dL (ref 12.0–16.0)
MCH: 29.2 pg (ref 26.0–34.0)
MCHC: 33.3 g/dL (ref 32.0–36.0)
MCV: 87.8 fL (ref 80.0–100.0)
PLATELETS: 220 10*3/uL (ref 150–440)
RBC: 4.4 MIL/uL (ref 3.80–5.20)
RDW: 13.1 % (ref 11.5–14.5)
WBC: 7.4 10*3/uL (ref 3.6–11.0)

## 2016-02-08 LAB — GLUCOSE, CAPILLARY
GLUCOSE-CAPILLARY: 199 mg/dL — AB (ref 65–99)
Glucose-Capillary: 230 mg/dL — ABNORMAL HIGH (ref 65–99)

## 2016-02-08 IMAGING — CR DG CHEST 2V
1 series · 2 of 2 positions shown · non-contrast
Comparison: 10/24/2014 and 07/22/2014

CLINICAL DATA: Shortness of breath and recent pneumonia.

EXAM:
CHEST  2 VIEW

[Series 1: dg chest 2 view · 0.14mm/px · 2 of 2 slices shown]
[im 1/2]
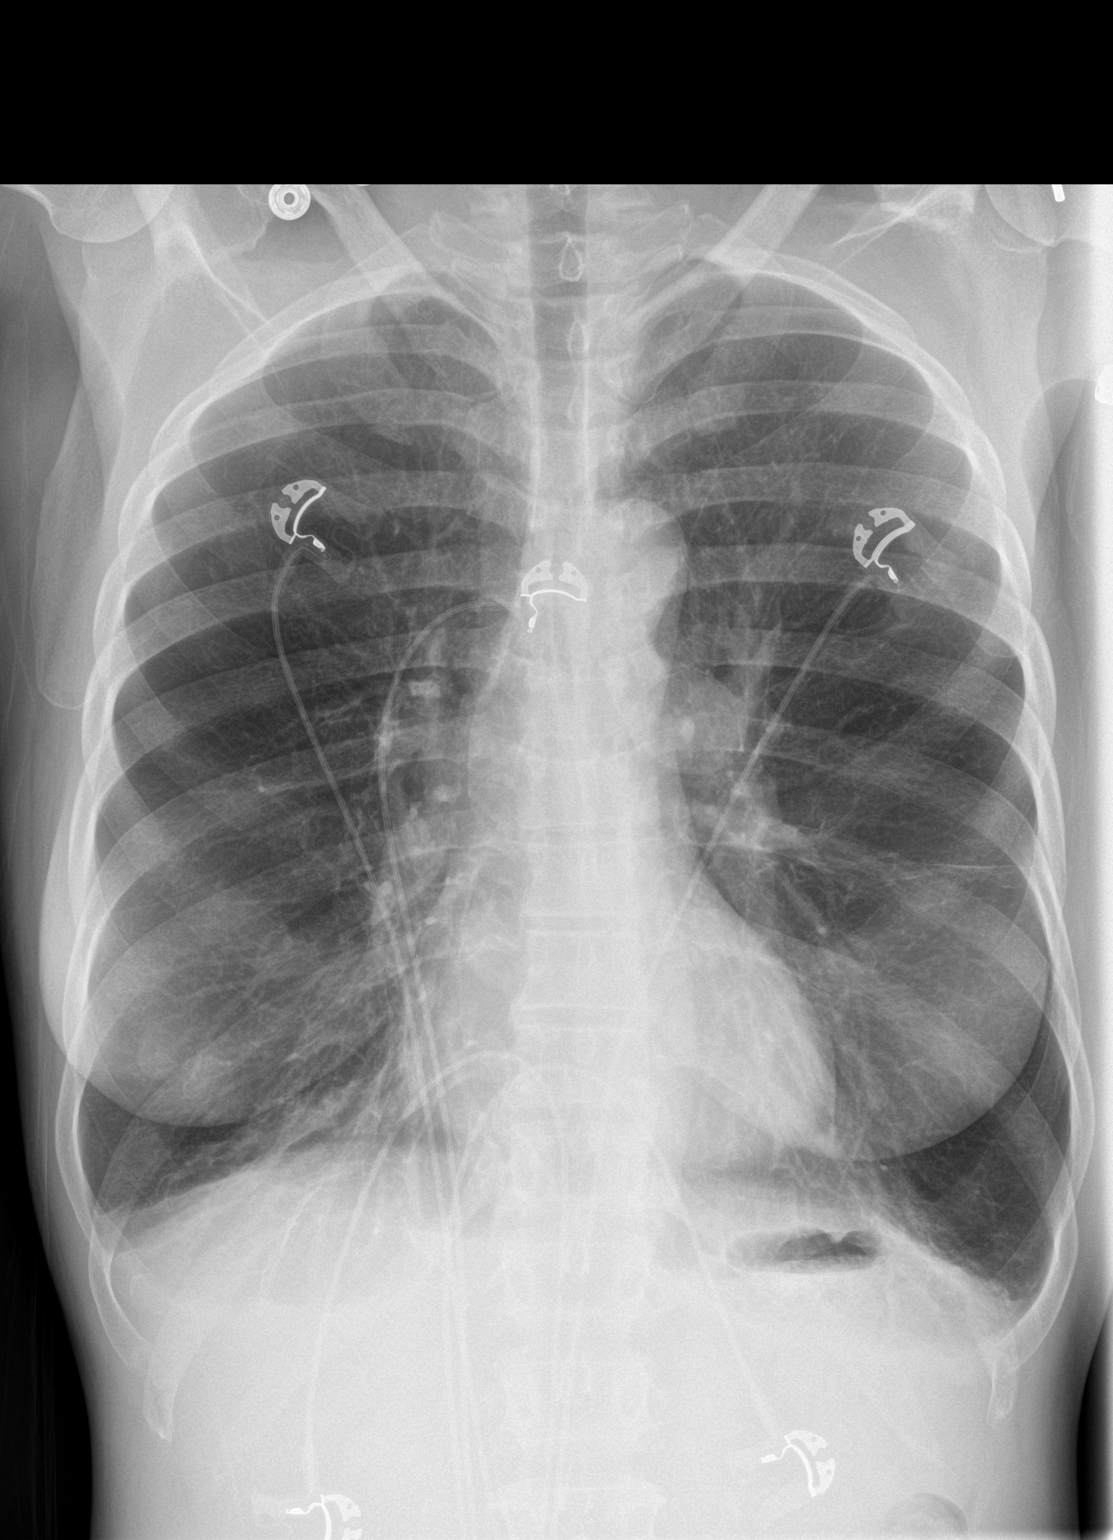
[im 2/2]
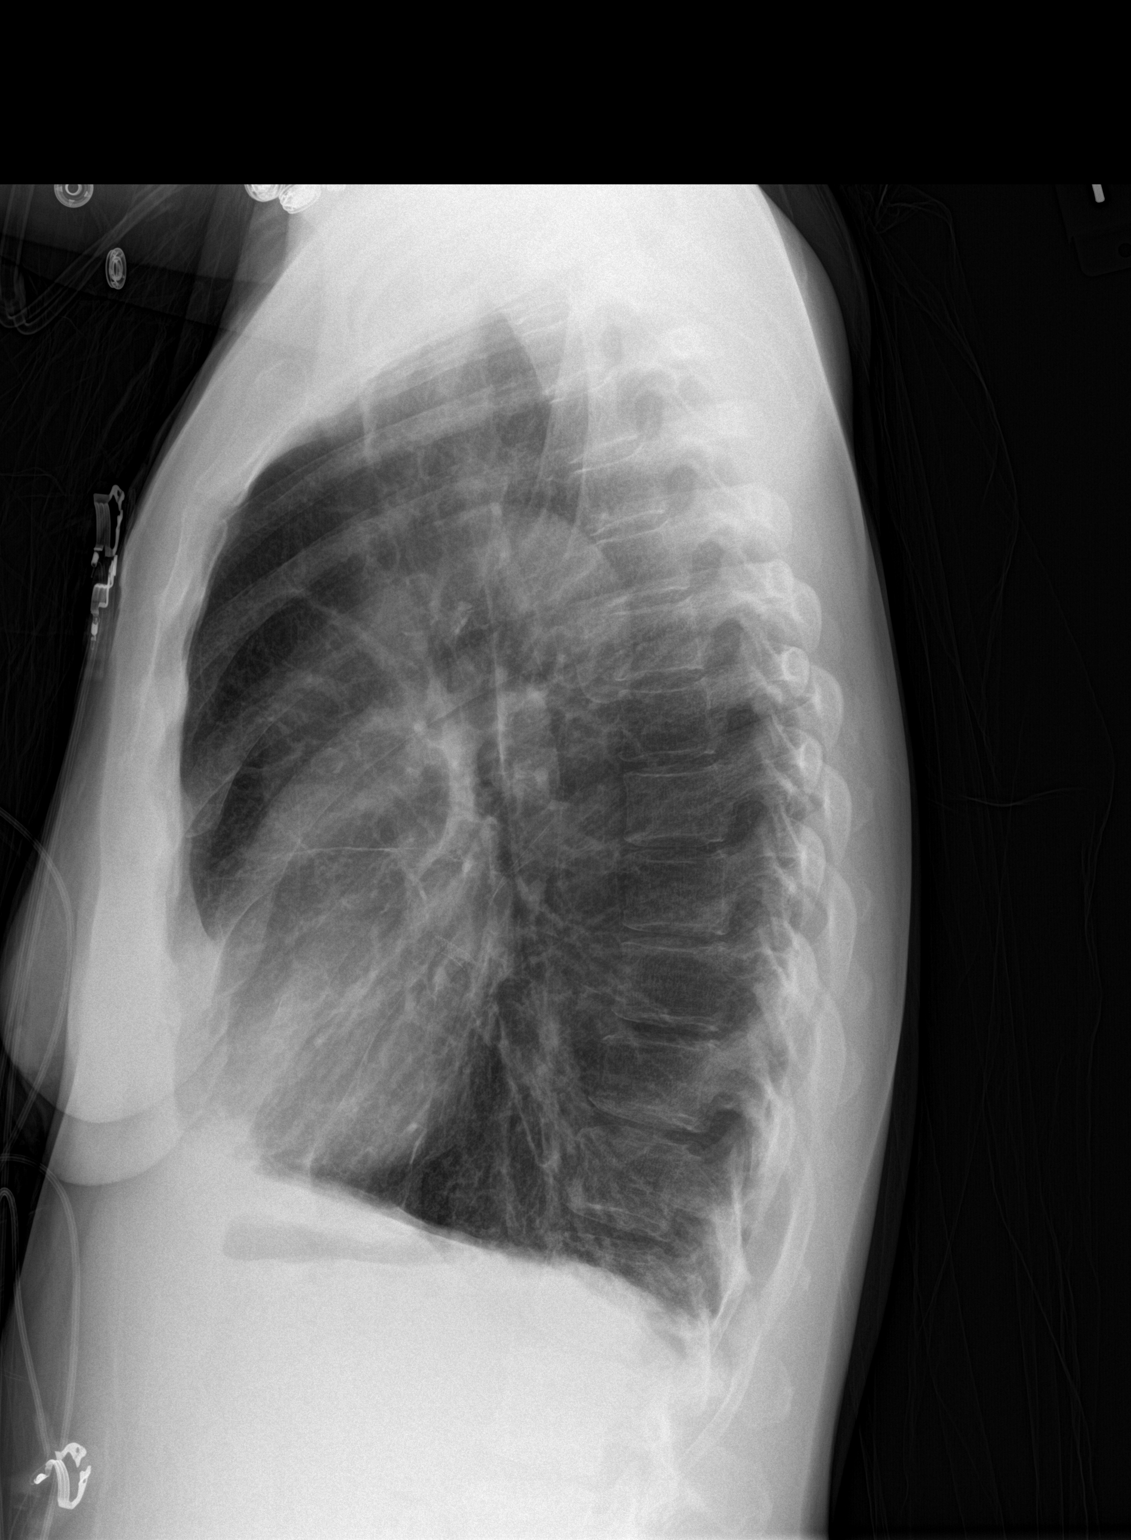

[2 of 2 positions shown; findings below may reference images not displayed]

FINDINGS: Lungs are somewhat hyperexpanded with flattening of the
hemidiaphragms on the lateral film. Near complete resolution of the
previously seen bibasilar opacification. Cardiomediastinal
silhouette and remainder of the exam is unchanged.
IMPRESSION: Near complete resolution of the previous noted bibasilar
opacification likely resolving infection or atelectasis/effusions.

COPD.

## 2016-02-08 MED ORDER — ENOXAPARIN SODIUM 30 MG/0.3ML ~~LOC~~ SOLN
30.0000 mg | SUBCUTANEOUS | Status: DC
  Administered 2016-02-08 – 2016-02-09 (×2): 30 mg via SUBCUTANEOUS
  Filled 2016-02-08 (×2): qty 0.3

## 2016-02-08 MED ORDER — INSULIN ASPART 100 UNIT/ML ~~LOC~~ SOLN
0.0000 [IU] | Freq: Three times a day (TID) | SUBCUTANEOUS | Status: DC
  Administered 2016-02-08: 3 [IU] via SUBCUTANEOUS
  Administered 2016-02-09: 1 [IU] via SUBCUTANEOUS
  Administered 2016-02-09: 12:00:00 7 [IU] via SUBCUTANEOUS
  Administered 2016-02-10: 2 [IU] via SUBCUTANEOUS
  Filled 2016-02-08: qty 3
  Filled 2016-02-08: qty 2
  Filled 2016-02-08: qty 7
  Filled 2016-02-08: qty 1

## 2016-02-08 MED ORDER — INSULIN ASPART 100 UNIT/ML ~~LOC~~ SOLN: 0.0000 [IU] | Freq: Every day | SUBCUTANEOUS | Status: DC

## 2016-02-08 NOTE — Progress Notes (Signed)
Initial Nutrition Assessment  DOCUMENTATION CODES:   Non-severe (moderate) malnutrition in context of chronic illness, Underweight  INTERVENTION:  -Continue Ensure BID; encouraged pt to increase frequency of Ensure intake at home. Plan to provide pt with Ensure coupons on follow-up -Encouraged smaller, more frequent meals -Discussed importance of adding gravies/sauces, sugar, etc to foods to increase kcals without increasing volume. Encouraged high kcals/high protein snacks such as nuts. Will follow-up with written material on high calorie, high protein nutrition   NUTRITION DIAGNOSIS:   Malnutrition related to chronic illness as evidenced by moderate depletion of body fat, moderate depletions of muscle mass.  GOAL:   Patient will meet greater than or equal to 90% of their needs  MONITOR:   PO intake  REASON FOR ASSESSMENT:   (P) Malnutrition Screening Tool    ASSESSMENT:    55 yo female admitted with acute respiratory failure due to COPD exacerbation and acute bronchitis  Pt ate some breakfast, drank 100% of Ensure this AM. Pt reports appetite is good, typically eats 3 small meals per day (small portions). Pt drinks 1 Ensure per day. Pt gradual wt loss over time; pt reports few pound wt loss over the past few months. Pt is underweight, and reports inability to gain weight.   Nutrition-Focused physical exam completed. Findings are mild/moderate fat depletion, mild/moderate muscle depletion, and no edema.    Past Medical History:  Diagnosis Date  . Asthma   . CHF (congestive heart failure) ([place])   . Cocaine abuse   . COPD (chronic obstructive pulmonary disease) ([place])   . Coronary artery disease   . Emphysema/COPD ([place])   . High cholesterol   . Hypertension   . Pneumothorax   . Tobacco abuse     Diet Order:  Diet 2 gram sodium Room service appropriate? Yes; Fluid consistency: Thin  Skin:  Reviewed, no issues  Last BM:  9/3   Labs: reviewed  Meds:  reviewed  Height:   Ht Readings from Last 1 Encounters:  02/07/16 5\' 1"  (1.549 m)    Weight:   Wt Readings from Last 1 Encounters:  02/07/16 80 lb 0.4 oz (36.3 kg)    BMI:  Body mass index is 15.12 kg/m.  Estimated Nutritional Needs:   Kcal:  1400-1600 kcals   Protein:  55-70 g  Fluid:  >/= 1.5 L  EDUCATION NEEDS:   Education needs addressed  Kerman Passey MS, [place], LDN [phone] Pager  [phone] Weekend/On-Call Pager

## 2016-02-08 NOTE — Progress Notes (Addendum)
Patient ID: Michelle [name], female   DOB: Dec 18, 1960, 55 y.o.   MRN: [id number]  [facility] PROGRESS NOTE  WADE GERDEMAN [id number] DOB: 1961-03-20 DOA: 02/07/2016 PCP: No primary care provider on file.  HPI/Subjective: Patient breathing a little bit better than previously. Still short of breath. Some cough and wheeze. At the patient's baseline she has very poor exercise capacity. Patient was seen just after she got back from the commode and she was using accessory muscles. The last blood pressure reading was high but has not been during the hospital course. This likely a false positive from her coming back from the commode  Objective: Vitals:   02/08/16 1100 02/08/16 1200  BP: (!) 140/123   Pulse: 75 84  Resp: 14 (!) 24  Temp:  97.8 F (36.6 C)    Filed Weights   02/07/16 1536 02/07/16 1926  Weight: 35.8 kg (79 lb) 36.3 kg (80 lb 0.4 oz)    ROS: Review of Systems  Constitutional: Negative for chills and fever.  Eyes: Negative for blurred vision.  Respiratory: Positive for cough, shortness of breath and wheezing.   Cardiovascular: Negative for chest pain.  Gastrointestinal: Negative for abdominal pain, constipation, diarrhea, nausea and vomiting.  Genitourinary: Negative for dysuria.  Musculoskeletal: Negative for joint pain.  Neurological: Negative for dizziness and headaches.   Exam: Physical Exam  Constitutional: She is oriented to person, place, and time.  HENT:  Nose: No mucosal edema.  Mouth/Throat: No oropharyngeal exudate or posterior oropharyngeal edema.  Eyes: Conjunctivae, EOM and lids are normal. Pupils are equal, round, and reactive to light.  Neck: No JVD present. Carotid bruit is not present. No edema present. No thyroid mass and no thyromegaly present.  Cardiovascular: S1 normal and S2 normal.  Exam reveals no gallop.   No murmur heard. Pulses:      Dorsalis pedis pulses are 2+ on the right side, and 2+ on the left side.  Respiratory: Accessory  muscle usage present. No respiratory distress. She has decreased breath sounds in the right upper field, the right middle field, the right lower field, the left upper field, the left middle field and the left lower field. She has wheezes in the right upper field and the left upper field. She has rhonchi in the right lower field and the left lower field. She has no rales.  GI: Soft. Bowel sounds are normal. There is no tenderness.  Musculoskeletal:       Right ankle: She exhibits no swelling.       Left ankle: She exhibits no swelling.  Small cyst that painful and mobile right hip.  Lymphadenopathy:    She has no cervical adenopathy.  Neurological: She is alert and oriented to person, place, and time. No cranial nerve deficit.  Skin: Skin is warm. No rash noted. Nails show no clubbing.  Psychiatric: She has a normal mood and affect.      Data Reviewed: Basic Metabolic Panel:  Recent Labs Lab 02/07/16 1537 02/08/16 0505  NA 140 142  K 3.4* 3.8  CL 104 113*  CO2 32 22  GLUCOSE 103* 188*  BUN 24* 20  CREATININE 0.66 0.57  CALCIUM 9.0 9.2   CBC:  Recent Labs Lab 02/07/16 1537 02/08/16 0505  WBC 6.1 7.4  NEUTROABS 3.9  --   HGB 13.2 12.9  HCT 39.4 38.6  MCV 87.3 87.8  PLT 216 220   Cardiac Enzymes:  Recent Labs Lab 02/07/16 1537  TROPONINI <0.03   BNP (  last 3 results)  Recent Labs  11/05/15 0827  BNP 36.0    CBG:  Recent Labs Lab 02/07/16 1922  GLUCAP 194*    Recent Results (from the past 240 hour(s))  MRSA PCR Screening     Status: Abnormal   Collection Time: 02/07/16  7:27 PM  Result Value Ref Range Status   MRSA by PCR POSITIVE (A) NEGATIVE Final    Comment:        The GeneXpert MRSA Assay (FDA approved for NASAL specimens only), is one component of a comprehensive MRSA colonization surveillance program. It is not intended to diagnose MRSA infection nor to guide or monitor treatment for MRSA infections. CALLED TO [name] @ 2233 ON  02/07/2016 BY CAF      Studies: Dg Chest Portable 1 View  Result Date: 02/07/2016 CLINICAL DATA:  Difficulty breathing. EXAM: PORTABLE CHEST 1 VIEW COMPARISON:  CT 12/01/2015.  Chest x-ray 11/30/2015. FINDINGS: Mediastinum and hilar structures are normal. Mild bibasilar atelectasis and/or scarring. Pleural parenchymal thickening again noted consistent with scarring . COPD cannot be excluded. Heart size stable. No acute bony abnormality P IMPRESSION: Mild bibasilar subsegmental atelectasis and/or scarring. Pleural parenchymal scarring. COPD cannot be excluded. No acute pulmonary abnormality identified. Electronically Signed   By: Marcello Moores  Register   On: 02/07/2016 15:42    Scheduled Meds: . aspirin EC  81 mg Oral Daily  . azithromycin  500 mg Oral Daily  . carvedilol  3.125 mg Oral BH-q7a  . cefTRIAXone (ROCEPHIN)  IV  1 g Intravenous Q24H  . Chlorhexidine Gluconate Cloth  6 each Topical Q0600  . docusate sodium  100 mg Oral BID  . enalapril  5 mg Oral Daily  . enoxaparin (LOVENOX) injection  30 mg Subcutaneous Q24H  . feeding supplement (ENSURE ENLIVE)  237 mL Oral BID BM  . ipratropium-albuterol  3 mL Nebulization Q4H  . methylPREDNISolone (SOLU-MEDROL) injection  60 mg Intravenous Q6H  . mometasone-formoterol  2 puff Inhalation BID  . mupirocin ointment  1 application Nasal BID  . nicotine  14 mg Transdermal Daily    Assessment/Plan:  1. Acute on chronic hypoxic and hypercarbic respiratory failure. Initially required BiPAP and was admitted to the ICU stepdown unit. Currently over to nasal cannula. At home she wears 1 L nasal cannula at night and 2-3 L during the day. Transferred to a floor bed. 2. COPD exacerbation. Continue Zithromax and Rocephin. Continue high-dose Solu-Medrol. Continue nebulizer treatments. Looking back at pulmonologist note that she does have severe COPD on PFTs 3. Tobacco abuse patient smokes 1 cigarette per day. I advised that this point it is essential that she  stops smoking. 4. Weakness and poor exercise capacity. Physical therapy evaluation 5. History of diastolic congestive heart failure. No signs of heart failure currently. 6. Right hip pain with a cyst there. Continue antibiotics. No signs of cellulitis. X-ray of the right hip.  Code Status:     Code Status Orders        Start     Ordered   02/07/16 1923  Full code  Continuous     02/07/16 1922    Code Status History    Date Active Date Inactive Code Status Order ID Comments User Context   11/29/2015  5:19 AM 12/01/2015  4:09 PM Full Code [id number]  Harrie Foreman, MD Inpatient   11/05/2015  9:11 AM 11/07/2015  4:06 PM Full Code [id number]  Hillary Bow, MD ED   10/24/2014  3:22 PM 10/27/2014  4:30 PM Full Code [id number]  Hillary Bow, MD Inpatient      Disposition Plan: To be determined based on clinical course  Antibiotics:  Rocephin  Zithromax  Time spent: 25 minutes  [place], [place]

## 2016-02-08 NOTE — Care Management Note (Addendum)
Case Management Note  Patient Details  Name: Michelle [name] MRN: [id number] Date of Birth: [birth year]  Subjective/Objective:                  Met with patient to discuss discharge planning as patient is self-pay and frequently comes to the hospital due to COPD exacerbation. She lives with her daughters. She states she drives and has been independent with mobility. She states that a cane would help her with ambulation. She is [facility]. She is not currently receiving home health. [facility] has [facility] in the past. They closed her case in early August 2017.  Patient states that she is not homebound at this visit. She gets her medications without difficulty [facility].  Action/Plan: RNCM will continue to follow. Palliative was discussed during ICU progression; no changes at this time. Cane delivered to patient from [facility] closet this visit.   Expected Discharge Date:                  Expected Discharge Plan:     In-House Referral:     Discharge planning Services  CM Consult  Post Acute Care Choice:    Choice offered to:  Patient  DME Arranged:    DME Agency:     HH Arranged:    [place] Agency:     Status of Service:  In process, will continue to follow  If discussed at Long Length of Stay Meetings, dates discussed:    Additional Comments:  Marshell Garfinkel, RN 02/08/2016, 10:56 AM

## 2016-02-08 NOTE — Progress Notes (Signed)
Inpatient Diabetes Program Recommendations  AACE/ADA: New Consensus Statement on Inpatient Glycemic Control (2015)  Target Ranges:  Prepandial:   less than 140 mg/dL      Peak postprandial:   less than 180 mg/dL (1-2 hours)      Critically ill patients:  140 - 180 mg/dL   Lab Results  Component Value Date   GLUCAP 194 (H) 02/07/2016   HGBA1C 5.6 11/29/2015    Review of Glycemic Control  Diabetes history: none Outpatient Diabetes medications: N/A Current orders for Inpatient glycemic control: none  Inpatient Diabetes Program Recommendations:  Correction (SSI): While inpatient and steroids ordered, please consider ordering CBG's with Novolog correction.  Thanks,  Windy Carina, RN, BSN Diabetes Coordinator Inpatient Diabetes Program [phone] (Team Pager) [phone] (AP office) [phone] [facility] office) [phone] [facility] office)

## 2016-02-08 NOTE — Progress Notes (Addendum)
Report called to Skyler on 1C.  Pam will transfer the patient straight from radiology.  Patient's belongings, including her cell phone and eyeglasses, were taken to room 104 by this Probation officer

## 2016-02-08 NOTE — Progress Notes (Signed)
55 y/o cachectic female on DVT prophylaxis with Lovenox 40 mg daily. Due to weight 36 kg, will decrease Lovenox to 30 mg daily.   Ulice Dash, PharmD Clinical Pharmacist

## 2016-02-08 NOTE — Progress Notes (Signed)
Pharmacy Antibiotic Note  Michelle [name] is a 55 y.o. female admitted on 02/07/2016 with  .  Pharmacy has been consulted for ceftriaxone dosing. Patient is also on azithromycin.   Plan: Continue ceftriaxone 1 g IV daily.  Height: 5\' 1"  (154.9 cm) Weight: 80 lb 0.4 oz (36.3 kg) IBW/kg (Calculated) : 47.8  Temp (24hrs), Avg:98.2 F (36.8 C), Min:97.6 F (36.4 C), Max:99 F (37.2 C)   Recent Labs Lab 02/07/16 1537 02/08/16 0505  WBC 6.1 7.4  CREATININE 0.66 0.57    Estimated Creatinine Clearance: 46.1 mL/min (by C-G formula based on SCr of 0.8 mg/dL).    Results for orders placed or performed during the hospital encounter of 02/07/16  MRSA PCR Screening     Status: Abnormal   Collection Time: 02/07/16  7:27 PM  Result Value Ref Range Status   MRSA by PCR POSITIVE (A) NEGATIVE Final    Comment:        The GeneXpert MRSA Assay (FDA approved for NASAL specimens only), is one component of a comprehensive MRSA colonization surveillance program. It is not intended to diagnose MRSA infection nor to guide or monitor treatment for MRSA infections. CALLED TO Michelle [name] @ 2233 ON 02/07/2016 BY CAF    Anti-infectives    Start     Dose/Rate Route Frequency Ordered Stop   02/07/16 2100  cefTRIAXone (ROCEPHIN) 1 g in dextrose 5 % 50 mL IVPB     1 g 100 mL/hr over 30 Minutes Intravenous Every 24 hours 02/07/16 1932     02/07/16 2000  azithromycin (ZITHROMAX) tablet 500 mg     500 mg Oral Daily 02/07/16 1922       No Known Allergies Thank you for allowing pharmacy to be a part of this patient's care.  Ulice Dash D 02/08/2016 12:09 PM

## 2016-02-09 LAB — URINE CULTURE: CULTURE: NO GROWTH

## 2016-02-09 LAB — GLUCOSE, CAPILLARY
Glucose-Capillary: 128 mg/dL — ABNORMAL HIGH (ref 65–99)
Glucose-Capillary: 301 mg/dL — ABNORMAL HIGH (ref 65–99)
Glucose-Capillary: 98 mg/dL (ref 65–99)
Glucose-Capillary: 193 mg/dL — ABNORMAL HIGH (ref 65–99)

## 2016-02-09 MED ORDER — IPRATROPIUM-ALBUTEROL 0.5-2.5 (3) MG/3ML IN SOLN
3.0000 mL | Freq: Four times a day (QID) | RESPIRATORY_TRACT | Status: DC
  Administered 2016-02-10 (×2): 3 mL via RESPIRATORY_TRACT
  Filled 2016-02-09 (×2): qty 3

## 2016-02-09 MED ORDER — AZITHROMYCIN 500 MG PO TABS
500.0000 mg | ORAL_TABLET | Freq: Every day | ORAL | Status: DC
Start: 2016-02-09 — End: 2016-02-10
  Administered 2016-02-09: 500 mg via ORAL
  Filled 2016-02-09: qty 1

## 2016-02-09 MED ORDER — METHYLPREDNISOLONE SODIUM SUCC 125 MG IJ SOLR
60.0000 mg | Freq: Every day | INTRAMUSCULAR | Status: DC
  Administered 2016-02-10: 08:00:00 60 mg via INTRAVENOUS
  Filled 2016-02-09: qty 2

## 2016-02-09 NOTE — Evaluation (Signed)
Physical Therapy Evaluation Patient Details Name: Michelle [name] MRN: [id number] DOB: [birth year] Today's Date: 02/09/2016   History of Present Illness  Pt admitted for complaints of SOB. Pt in COPD Gold program. Pt with multiple admissions for similiar symptoms. Pt with history of CRF secondary to COPD, CHF, CAD, and HTN. Pt currently on 2L of O2 chronically.  Clinical Impression  Pt is a pleasant 55 year old female who was admitted for complaints of SOB. Pt performs bed mobility with independence and refuses further mobility attempts at this time secondary to fatigue. Pt reports she becomes SOB with increased ambulation distance at home. Pt demonstrates deficits with strength/endurance. Would benefit from skilled PT to address above deficits and promote optimal return to PLOF. Plan for continued assessment of mobility next date.      Follow Up Recommendations  (possible HHPT depending on progress)    Equipment Recommendations       Recommendations for Other Services       Precautions / Restrictions Precautions Precautions: Fall Restrictions Weight Bearing Restrictions: No      Mobility  Bed Mobility Overal bed mobility: Independent             General bed mobility comments: safe technique performed. Pt very sleepy and refuses to perform further mobility at this time. Will continue to assess functional mobility. Pt reports she is agreeable next date  Transfers                 General transfer comment: not performed  Ambulation/Gait                Stairs            Wheelchair Mobility    Modified Rankin (Stroke Patients Only)       Balance Overall balance assessment: Modified Independent                                           Pertinent Vitals/Pain Pain Assessment: No/denies pain    Home Living Family/patient expects to be discharged to:: Private residence Living Arrangements: Children (lives with  daughters) Available Help at Discharge: Family Type of Home: House Home Access: Stairs to enter Entrance Stairs-Rails: Can reach both Technical brewer of Steps: 4 Home Layout: One level Home Equipment: Cane - single point      Prior Function Level of Independence: Independent         Comments: independent with mobility, however does use SPC occasionally     Hand Dominance        Extremity/Trunk Assessment   Upper Extremity Assessment: Overall WFL for tasks assessed           Lower Extremity Assessment: Generalized weakness (B LE grossly 4/5)         Communication   Communication: No difficulties  Cognition Arousal/Alertness: Lethargic (Pt sleeping upon arrival) Behavior During Therapy: WFL for tasks assessed/performed Overall Cognitive Status: Within Functional Limits for tasks assessed                      General Comments      Exercises        Assessment/Plan    PT Assessment Patient needs continued PT services  PT Diagnosis Generalized weakness   PT Problem List Decreased strength;Decreased mobility  PT Treatment Interventions DME instruction;Gait training;Therapeutic exercise   PT Goals (Current goals can be  found in the Care Plan section) Acute Rehab PT Goals Patient Stated Goal: to go back to sleep PT Goal Formulation: With patient Time For Goal Achievement: 02/23/16 Potential to Achieve Goals: Good    Frequency Min 2X/week   Barriers to discharge        Co-evaluation               End of Session Equipment Utilized During Treatment: Oxygen Activity Tolerance: Patient tolerated treatment well Patient left: in bed;with family/visitor present Nurse Communication: Mobility status         Time: [id number] PT Time Calculation (min) (ACUTE ONLY): 8 min   Charges:   PT Evaluation $PT Eval Low Complexity: 1 Procedure     PT G Codes:        Michelle [name] 2016-02-24, 11:51 AM  Greggory Stallion, PT,  DPT [phone]

## 2016-02-09 NOTE — Progress Notes (Signed)
  RD follow-up regarding High-Calorie, High-Protein nutrition therapy education  RD provided "High-Calorie High-Protein Nutrition Therapy" handout from the Academy of Nutrition and Dietetics. Reviewed patient's dietary recall. Provided examples on ways to increase caloric density of foods and beverages frequently consumed by the patient. Also provided ideas to promote variety and to incorporate additional nutrient dense foods into patient's diet. Discussed eating small frequent meals and snacks to assist in increasing overall po intake. Teach back method used.  Expect good compliance.  Pt tolerating some po, drinking Ensure supplement (coupons provided to pt on visit today for use at discharge)  Body mass index is 15.12 kg/m. Pt meets criteria for underweight based on current BMI. Also meets criteria for moderate malnutrition as previously documented on initial assessment.  Continue to follow  Kerman Passey [place], [place], LDN [phone] Pager  [phone] Weekend/On-Call Pager

## 2016-02-09 NOTE — Progress Notes (Signed)
[place] [facility] [place] NAME: Michelle [name]    MR#:  [id number]  DATE OF BIRTH:  June 06, 1960  SUBJECTIVE:  CHIEF COMPLAINT:   Chief Complaint  Patient presents with  . Shortness of Breath   - Breathing is improving, feels tired - Used Bipap last night  REVIEW OF SYSTEMS:  Review of Systems  Constitutional: Positive for malaise/fatigue. Negative for chills, fever and weight loss.  HENT: Negative for ear discharge, ear pain, hearing loss and nosebleeds.   Eyes: Negative for blurred vision, double vision and photophobia.  Respiratory: Positive for shortness of breath. Negative for cough, hemoptysis and wheezing.   Cardiovascular: Negative for chest pain, palpitations, orthopnea and leg swelling.  Gastrointestinal: Negative for abdominal pain, constipation, diarrhea, heartburn, melena, nausea and vomiting.  Genitourinary: Negative for dysuria, frequency, hematuria and urgency.  Musculoskeletal: Positive for joint pain. Negative for back pain, myalgias and neck pain.  Skin: Negative for rash.  Neurological: Negative for dizziness, tingling, tremors, sensory change, speech change, focal weakness and headaches.  Endo/Heme/Allergies: Does not bruise/bleed easily.  Psychiatric/Behavioral: Negative for depression.    DRUG ALLERGIES:  No Known Allergies  VITALS:  Blood pressure 105/73, pulse 73, temperature 97.8 F (36.6 C), resp. rate 19, height 5\' 1"  (1.549 m), weight 36.3 kg (80 lb 0.4 oz), last menstrual period 03/06/2005, SpO2 98 %.  PHYSICAL EXAMINATION:  Physical Exam  GENERAL:  55 y.o.-year-old cachectic patient sitting in the bed, Not in any acute distress. EYES: Pupils equal, round, reactive to light and accommodation. No scleral icterus. Extraocular muscles intact.  HEENT: Head atraumatic, normocephalic. Oropharynx and nasopharynx clear.  NECK:  Supple, no jugular venous distention. No thyroid enlargement, no tenderness.  LUNGS:  Much improved  breath sounds bilaterally, improved wheezing,no rales,rhonchi or crepitation. No use of accessory muscles of respiration now. Moving air bilaterally CARDIOVASCULAR: S1, S2 normal. No murmurs, rubs, or gallops.  ABDOMEN: Soft, nontender, nondistended. Bowel sounds present. No organomegaly or mass.  EXTREMITIES: No pedal edema, cyanosis, or clubbing. Small mobile cyst/lipoma on the right hip. No swelling or tenderness. NEUROLOGIC: Cranial nerves II through XII are intact. Muscle strength 5/5 in all extremities. Sensation intact. Gait not checked.  PSYCHIATRIC: The patient is alert and oriented x 3.  SKIN: No obvious rash, lesion, or ulcer.    LABORATORY PANEL:   CBC  Recent Labs Lab 02/08/16 0505  WBC 7.4  HGB 12.9  HCT 38.6  PLT 220   ------------------------------------------------------------------------------------------------------------------  Chemistries   Recent Labs Lab 02/08/16 0505  NA 142  K 3.8  CL 113*  CO2 22  GLUCOSE 188*  BUN 20  CREATININE 0.57  CALCIUM 9.2   ------------------------------------------------------------------------------------------------------------------  Cardiac Enzymes  Recent Labs Lab 02/07/16 1537  TROPONINI <0.03   ------------------------------------------------------------------------------------------------------------------  RADIOLOGY:  Dg Chest Portable 1 View  Result Date: 02/07/2016 CLINICAL DATA:  Difficulty breathing. EXAM: PORTABLE CHEST 1 VIEW COMPARISON:  CT 12/01/2015.  Chest x-ray 11/30/2015. FINDINGS: Mediastinum and hilar structures are normal. Mild bibasilar atelectasis and/or scarring. Pleural parenchymal thickening again noted consistent with scarring . COPD cannot be excluded. Heart size stable. No acute bony abnormality P IMPRESSION: Mild bibasilar subsegmental atelectasis and/or scarring. Pleural parenchymal scarring. COPD cannot be excluded. No acute pulmonary abnormality identified.  Electronically Signed   By: Marcello Moores  Register   On: 02/07/2016 15:42   Dg Hip Unilat With Pelvis 2-3 Views Right  Result Date: 02/08/2016 CLINICAL DATA:  Right hip pain. EXAM: DG HIP (WITH OR WITHOUT  PELVIS) 2-3V RIGHT COMPARISON:  None. FINDINGS: Mild hip joint narrowing bilaterally. SI joints are symmetric and unremarkable. No acute bony abnormality. Specifically, no fracture, subluxation, or dislocation. Soft tissues are intact. Bilateral tubal ligation clips noted. IMPRESSION: Early joint space narrowing bilaterally.  No acute bony abnormality. Electronically Signed   By: Rolm Baptise M.D.   On: 02/08/2016 13:09    EKG:   Orders placed or performed during the hospital encounter of 02/07/16  . ED EKG  . ED EKG    ASSESSMENT AND PLAN:   Michelle [name]  is a 55 y.o. female with a known history of Chronic respiratory failure secondary to stage IV COPD on 2 L home oxygen, diastolic CHF, CAD, hypertension, ongoing smoking presents to the hospital secondary to worsening shortness of breath.  #1 Acute on chronic respiratory failure-hypoxic and hypercarbic. -Secondary to COPD exacerbation and acute bronchitis. -Used BiPAP last night.  -Improved now. Discontinue BiPAP and continue nasal cannula oxygen. Patient on 1 L during the daytime and 2 L at nighttime at home -Decrease Solu-Medrol to daily, continue duonebs and continue inhalers - on rocephin and azithromycin for now -Last hospitalization in June 2017, last prednisone and azithromycin as outpatient in July 2017. Follows with pulmonologist at [facility]. Finished pulmonary rehabilitation in the past. - ECHO with mild pulmonary hypertension and diastolic dysfunction, EF [id number]  #2 HTN- on coreg and enalapril  #3 anxiety-continue Ativan  #4 tobacco use disorder-cutting down slowly. On nicotine patch  #5 DVT prophylaxis-on Lovenox  #6 right hip pain-x-rays negative other than early changes of arthritis. Subcutaneous lipoma can be addressed as  outpatient. Not limiting her movement.   Possible discharge in the next 1-2 days   All the records are reviewed and case discussed with Care Management/Social Workerr. Management plans discussed with the patient, family and they are in agreement.  CODE STATUS: Full code  TOTAL TIME TAKING CARE OF THIS PATIENT: 37 minutes.   POSSIBLE D/C IN 1-2 DAYS, DEPENDING ON CLINICAL CONDITION.   Gladstone Lighter M.D on 02/09/2016 at 11:31 AM  Between 7am to 6pm - Pager - [phone]  After 6pm go to [email or web address] - password [email or web address] [facility]  Office  [phone]  CC: Primary care physician; No primary care provider on file.

## 2016-02-10 LAB — GLUCOSE, CAPILLARY
Glucose-Capillary: 104 mg/dL — ABNORMAL HIGH (ref 65–99)
Glucose-Capillary: 153 mg/dL — ABNORMAL HIGH (ref 65–99)

## 2016-02-10 MED ORDER — FLUTICASONE-SALMETEROL 250-50 MCG/DOSE IN AEPB: 1.0000 | INHALATION_SPRAY | Freq: Two times a day (BID) | RESPIRATORY_TRACT | 2 refills | Status: DC

## 2016-02-10 MED ORDER — ENALAPRIL MALEATE 10 MG PO TABS: 5.0000 mg | ORAL_TABLET | Freq: Every day | ORAL | 2 refills | Status: DC

## 2016-02-10 MED ORDER — PREDNISONE 10 MG (21) PO TBPK: 10.0000 mg | ORAL_TABLET | Freq: Every day | ORAL | 0 refills | Status: DC

## 2016-02-10 MED ORDER — IPRATROPIUM-ALBUTEROL 0.5-2.5 (3) MG/3ML IN SOLN: 3.0000 mL | RESPIRATORY_TRACT | 0 refills | Status: DC | PRN

## 2016-02-10 MED ORDER — CEFUROXIME AXETIL 500 MG PO TABS: 500.0000 mg | ORAL_TABLET | Freq: Two times a day (BID) | ORAL | 0 refills | Status: DC

## 2016-02-10 MED ORDER — SIMVASTATIN 20 MG PO TABS: 20.0000 mg | ORAL_TABLET | Freq: Every day | ORAL | 2 refills | Status: DC

## 2016-02-10 MED ORDER — CARVEDILOL 3.125 MG PO TABS: 3.1250 mg | ORAL_TABLET | ORAL | 2 refills | Status: DC

## 2016-02-10 MED ORDER — BENZONATATE 100 MG PO CAPS
100.0000 mg | ORAL_CAPSULE | Freq: Three times a day (TID) | ORAL | Status: DC
  Administered 2016-02-10: 12:00:00 100 mg via ORAL
  Filled 2016-02-10: qty 1

## 2016-02-10 MED ORDER — BENZONATATE 100 MG PO CAPS: 100.0000 mg | ORAL_CAPSULE | Freq: Three times a day (TID) | ORAL | 0 refills | Status: DC

## 2016-02-10 MED ORDER — LORAZEPAM 1 MG PO TABS: 1.0000 mg | ORAL_TABLET | Freq: Two times a day (BID) | ORAL | 0 refills | Status: DC | PRN

## 2016-02-10 MED ORDER — TIOTROPIUM BROMIDE MONOHYDRATE 18 MCG IN CAPS: 18.0000 ug | ORAL_CAPSULE | Freq: Every day | RESPIRATORY_TRACT | 12 refills | Status: DC

## 2016-02-10 NOTE — Progress Notes (Addendum)
Physical Therapy Treatment Patient Details Name: Michelle [name] MRN: [id number] DOB: [birth year] Today's Date: 02/10/2016    History of Present Illness Pt admitted for complaints of SOB. Pt in COPD Gold program. Pt with multiple admissions for similiar symptoms. Pt with history of CRF secondary to COPD, CHF, CAD, and HTN. Pt currently on 2L of O2 chronically.    PT Comments    Pt has met all goals for therapy and is independent with mobility. Observed pt with bathroom tasks, no safety concerns noted.  All mobility performed without AD with sats at 98% while on 2L of O2. Pt does not require skilled PT needs at this time. Will plan to dc in house. MD notified.  Follow Up Recommendations  No PT follow up     Equipment Recommendations  None recommended by PT    Recommendations for Other Services       Precautions / Restrictions Precautions Precautions: Fall Restrictions Weight Bearing Restrictions: No    Mobility  Bed Mobility Overal bed mobility: Independent             General bed mobility comments: safe technique performed this date.  Transfers Overall transfer level: Independent Equipment used: None             General transfer comment: safe technique performed. No LOB noted  Ambulation/Gait Ambulation/Gait assistance: Supervision Ambulation Distance (Feet): 30 Feet Assistive device: None Gait Pattern/deviations: Step-through pattern     General Gait Details: ambulated using no AD demonstrating safe technique. Pt reports she is tired and request no further mobility this date. No LOB noted.   Stairs            Wheelchair Mobility    Modified Rankin (Stroke Patients Only)       Balance                                    Cognition Arousal/Alertness: Awake/alert Behavior During Therapy: WFL for tasks assessed/performed Overall Cognitive Status: Within Functional Limits for tasks assessed                      Exercises  Other Exercises Other Exercises: ambulated to [facility] and is independent with all hygiene and clean up. Other Exercises: Pt educated in HEP for home use including heel slides, hip abd/add, and LAQ. All ther-ex performed x 10 reps with cga and cues for correct technique.     General Comments        Pertinent Vitals/Pain Pain Assessment: No/denies pain    Home Living                      Prior Function            PT Goals (current goals can now be found in the care plan section) Acute Rehab PT Goals Patient Stated Goal: to go back to sleep PT Goal Formulation: All assessment and education complete, DC therapy Time For Goal Achievement: 02/10/16 Potential to Achieve Goals: Good Progress towards PT goals: Goals met/education completed, patient discharged from PT    Frequency       PT Plan Frequency needs to be updated    Co-evaluation             End of Session Equipment Utilized During Treatment: Oxygen Activity Tolerance: Patient tolerated treatment well Patient left: in chair     Time: 3086-5784 PT Time  Calculation (min) (ACUTE ONLY): 13 min  Charges:  $Gait Training: 8-22 mins                    G Codes:      Michelle [name] Feb 29, 2016, 12:02 PM  Michelle [name], PT, DPT [phone]

## 2016-02-10 NOTE — Discharge Summary (Signed)
[facility] NAME: Michelle [name]    MR#:  [id number]  DATE OF BIRTH:  01-Apr-1961  DATE OF ADMISSION:  02/07/2016   ADMITTING PHYSICIAN: Gladstone Lighter, MD  DATE OF DISCHARGE: 02/10/2016  PRIMARY CARE PHYSICIAN: No primary care provider on file.   ADMISSION DIAGNOSIS:   COPD exacerbation ([place]) [J44.1] Acute on chronic respiratory failure with hypercapnia ([facility]) [J96.22]  DISCHARGE DIAGNOSIS:   Active Problems:   Acute respiratory failure ([place])   SECONDARY DIAGNOSIS:   Past Medical History:  Diagnosis Date  . Asthma   . CHF (congestive heart failure) ([place])   . Cocaine abuse   . COPD (chronic obstructive pulmonary disease) ([place])   . Coronary artery disease   . Emphysema/COPD ([place])   . High cholesterol   . Hypertension   . Pneumothorax   . Tobacco abuse     HOSPITAL COURSE:   Michelle [name] a 55 y.o. femalewith a known history of Chronic respiratory failure secondary to stage IV COPD on 2 L home oxygen, diastolic CHF, CAD, hypertension, ongoing smoking presents to the hospital secondary to worsening shortness of breath.  #1 Acute on chronic respiratory failure-hypoxic and hypercarbic on admission. -Secondary to COPD exacerbation and acute bronchitis. -Used BiPAP on admission  -Now much improved and back on 2L o2 and ambulated well with physical therapy -steroids changed to prednisone taper, continue duonebs and continue inhalers as outpatient - on rocephin and azithromycin - changed to ceftin at discharge -Last hospitalization in June 2017, last prednisone and azithromycin as outpatient in July 2017. Follows with pulmonologist at [facility]. Finished pulmonary rehabilitation in the past. - ECHO with mild pulmonary hypertension and diastolic dysfunction, EF [id number]  #2 HTN- on coreg and enalapril  #3 anxiety-continue Ativan  #4 tobacco use disorder-cutting down slowly.   #5 right hip pain-x-rays negative other than  early changes of arthritis. Subcutaneous lipoma can be addressed as outpatient. Not limiting her movement.  Stable and being discharged today   DISCHARGE CONDITIONS:   Guarded CONSULTS OBTAINED:    none  DRUG ALLERGIES:   No Known Allergies DISCHARGE MEDICATIONS:     Medication List    STOP taking these medications   levofloxacin 250 MG tablet Commonly known as:  LEVAQUIN   predniSONE 10 MG tablet Commonly known as:  DELTASONE Replaced by:  predniSONE 10 MG (21) Tbpk tablet     TAKE these medications   albuterol 108 (90 Base) MCG/ACT inhaler Commonly known as:  PROVENTIL HFA;VENTOLIN HFA Inhale 2 puffs into the lungs every 6 (six) hours as needed for wheezing or shortness of breath.   aspirin EC 81 MG tablet Take 81 mg by mouth daily.   benzonatate 100 MG capsule Commonly known as:  TESSALON Take 1 capsule (100 mg total) by mouth 3 (three) times daily.   carvedilol 3.125 MG tablet Commonly known as:  COREG Take 1 tablet (3.125 mg total) by mouth every morning.   cefUROXime 500 MG tablet Commonly known as:  CEFTIN Take 1 tablet (500 mg total) by mouth 2 (two) times daily with a meal. X 5 more days   enalapril 10 MG tablet Commonly known as:  VASOTEC Take 0.5 tablets (5 mg total) by mouth daily.   feeding supplement (ENSURE ENLIVE) Liqd Take 237 mLs by mouth 2 (two) times daily between meals.   Fluticasone-Salmeterol 250-50 MCG/DOSE Aepb Commonly known as:  ADVAIR Inhale 1 puff into the lungs 2 (two) times daily. What changed:  when to take this   guaiFENesin-dextromethorphan 100-10 MG/5ML syrup Commonly known as:  ROBITUSSIN DM Take 10 mLs by mouth every 8 (eight) hours as needed for cough.   ipratropium-albuterol 0.5-2.5 (3) MG/3ML Soln Commonly known as:  DUONEB Take 3 mLs by nebulization every 4 (four) hours as needed.   LORazepam 1 MG tablet Commonly known as:  ATIVAN Take 1 tablet (1 mg total) by mouth 2 (two) times daily as needed for  anxiety. What changed:  medication strength   nicotine 14 mg/24hr patch Commonly known as:  NICODERM CQ - dosed in mg/24 hours Place 1 patch (14 mg total) onto the skin daily.   predniSONE 10 MG (21) Tbpk tablet Commonly known as:  STERAPRED UNI-PAK 21 TAB Take 1 tablet (10 mg total) by mouth daily. 6 tabs PO x 1 day 5 tabs PO x 1 day 4 tabs PO x 1 day 3 tabs PO x 1 day 2 tabs PO x 1 day 1 tab PO x 1 day and stop Replaces:  predniSONE 10 MG tablet   simvastatin 20 MG tablet Commonly known as:  ZOCOR Take 1 tablet (20 mg total) by mouth at bedtime.   tiotropium 18 MCG inhalation capsule Commonly known as:  SPIRIVA Place 1 capsule (18 mcg total) into inhaler and inhale daily. What changed:  when to take this        DISCHARGE INSTRUCTIONS:   1. PCP follow-up in 1-2 weeks  DIET:   Cardiac diet  ACTIVITY:   Activity as tolerated  OXYGEN:   Home Oxygen: Yes.    Oxygen Delivery: 2 liters/min via Patient connected to nasal cannula oxygen  DISCHARGE LOCATION:   home   If you experience worsening of your admission symptoms, develop shortness of breath, life threatening emergency, suicidal or homicidal thoughts you must seek medical attention immediately by calling 911 or calling your MD immediately  if symptoms less severe.  You Must read complete instructions/literature along with all the possible adverse reactions/side effects for all the Medicines you take and that have been prescribed to you. Take any new Medicines after you have completely understood and accpet all the possible adverse reactions/side effects.   Please note  You were cared for by a hospitalist during your hospital stay. If you have any questions about your discharge medications or the care you received while you were in the hospital after you are discharged, you can call the unit and asked to speak with the hospitalist on call if the hospitalist that took care of you is not available. Once you are  discharged, your primary care physician will handle any further medical issues. Please note that NO REFILLS for any discharge medications will be authorized once you are discharged, as it is imperative that you return to your primary care physician (or establish a relationship with a primary care physician if you do not have one) for your aftercare needs so that they can reassess your need for medications and monitor your lab values.    On the day of Discharge:  VITAL SIGNS:   Blood pressure 114/72, pulse 77, temperature 99.1 F (37.3 C), temperature source Oral, resp. rate 16, height 5\' 1"  (1.549 m), weight 36.3 kg (80 lb 0.4 oz), last menstrual period 03/06/2005, SpO2 100 %.  PHYSICAL EXAMINATION:    GENERAL: 55 y.o.-year-old cachectic patient sitting in the bed, Not in any acute distress. EYES: Pupils equal, round, reactive to light and accommodation. No scleral icterus. Extraocular muscles intact.  HEENT: Head  atraumatic, normocephalic. Oropharynx and nasopharynx clear.  NECK: Supple, no jugular venous distention. No thyroid enlargement, no tenderness.  LUNGS: Much improved breath sounds bilaterally, improved wheezing,no rales,rhonchi or crepitation. No use of accessory muscles of respiration now. Moving air bilaterally CARDIOVASCULAR: S1, S2 normal. No murmurs, rubs, or gallops.  ABDOMEN: Soft, nontender, nondistended. Bowel sounds present. No organomegaly or mass.  EXTREMITIES: No pedal edema, cyanosis, or clubbing. Small mobile cyst/lipoma on the right hip. No swelling or tenderness. NEUROLOGIC: Cranial nerves II through XII are intact. Muscle strength 5/5 in all extremities. Sensation intact. Gait not checked.  PSYCHIATRIC: The patient is alert and oriented x 3.  SKIN: No obvious rash, lesion, or ulcer.   DATA REVIEW:   CBC  Recent Labs Lab 02/08/16 0505  WBC 7.4  HGB 12.9  HCT 38.6  PLT 220    Chemistries   Recent Labs Lab 02/08/16 0505  NA 142  K 3.8  CL 113*   CO2 22  GLUCOSE 188*  BUN 20  CREATININE 0.57  CALCIUM 9.2     Microbiology Results  Results for orders placed or performed during the hospital encounter of 02/07/16  Urine culture     Status: None   Collection Time: 02/07/16  2:00 PM  Result Value Ref Range Status   Specimen Description URINE, RANDOM  Final   Special Requests NONE  Final   Culture NO GROWTH Performed at [facility]   Final   Report Status 02/09/2016 FINAL  Final  MRSA PCR Screening     Status: Abnormal   Collection Time: 02/07/16  7:27 PM  Result Value Ref Range Status   MRSA by PCR POSITIVE (A) NEGATIVE Final    Comment:        The GeneXpert MRSA Assay (FDA approved for NASAL specimens only), is one component of a comprehensive MRSA colonization surveillance program. It is not intended to diagnose MRSA infection nor to guide or monitor treatment for MRSA infections. CALLED TO [name] @ 2233 ON 02/07/2016 BY CAF     RADIOLOGY:  No results found.   Management plans discussed with the patient, family and they are in agreement.  CODE STATUS:     Code Status Orders        Start     Ordered   02/07/16 1923  Full code  Continuous     02/07/16 1922    Code Status History    Date Active Date Inactive Code Status Order ID Comments User Context   11/29/2015  5:19 AM 12/01/2015  4:09 PM Full Code [id number]  Harrie Foreman, MD Inpatient   11/05/2015  9:11 AM 11/07/2015  4:06 PM Full Code [id number]  Hillary Bow, MD ED   10/24/2014  3:22 PM 10/27/2014  4:30 PM Full Code [id number]  Hillary Bow, MD Inpatient      TOTAL TIME TAKING CARE OF THIS PATIENT: 38 minutes.    Gladstone Lighter M.D on 02/10/2016 at 2:40 PM  Between 7am to 6pm - Pager - [phone]  After 6pm go to [email or web address] - [employer] [facility]  Office  [phone]  CC: Primary care physician; No primary care provider on file.   Note: This dictation was prepared with Dragon  dictation along with smaller phrase technology. Any transcriptional errors that result from this process are unintentional.

## 2016-04-23 ENCOUNTER — Inpatient Hospital Stay
Admission: EM | Admit: 2016-04-23 | Discharge: 2016-04-27 | DRG: 191 | Disposition: A | Discharge status: Home / Self Care | Payer: Medicaid Other | Attending: Internal Medicine | Admitting: Internal Medicine

## 2016-04-23 ENCOUNTER — Emergency Department: Payer: Medicaid Other

## 2016-04-23 ENCOUNTER — Encounter: Payer: Self-pay | Admitting: Emergency Medicine

## 2016-04-23 ENCOUNTER — Ambulatory Visit: Payer: Medicaid Other | Admitting: Pharmacist

## 2016-04-23 ENCOUNTER — Encounter (INDEPENDENT_AMBULATORY_CARE_PROVIDER_SITE_OTHER): Payer: Self-pay

## 2016-04-23 VITALS — BP 110/70 | Wt <= 1120 oz

## 2016-04-23 DIAGNOSIS — I272 Pulmonary hypertension, unspecified: Secondary | ICD-10-CM | POA: Diagnosis present

## 2016-04-23 DIAGNOSIS — I2510 Atherosclerotic heart disease of native coronary artery without angina pectoris: Secondary | ICD-10-CM | POA: Diagnosis present

## 2016-04-23 DIAGNOSIS — Z7982 Long term (current) use of aspirin: Secondary | ICD-10-CM | POA: Diagnosis not present

## 2016-04-23 DIAGNOSIS — Z79899 Other long term (current) drug therapy: Secondary | ICD-10-CM

## 2016-04-23 DIAGNOSIS — I110 Hypertensive heart disease with heart failure: Secondary | ICD-10-CM | POA: Diagnosis present

## 2016-04-23 DIAGNOSIS — Z8249 Family history of ischemic heart disease and other diseases of the circulatory system: Secondary | ICD-10-CM

## 2016-04-23 DIAGNOSIS — F419 Anxiety disorder, unspecified: Secondary | ICD-10-CM | POA: Diagnosis present

## 2016-04-23 DIAGNOSIS — F17210 Nicotine dependence, cigarettes, uncomplicated: Secondary | ICD-10-CM | POA: Diagnosis present

## 2016-04-23 DIAGNOSIS — J449 Chronic obstructive pulmonary disease, unspecified: Secondary | ICD-10-CM | POA: Diagnosis present

## 2016-04-23 DIAGNOSIS — Z9981 Dependence on supplemental oxygen: Secondary | ICD-10-CM | POA: Diagnosis not present

## 2016-04-23 DIAGNOSIS — E785 Hyperlipidemia, unspecified: Secondary | ICD-10-CM | POA: Diagnosis present

## 2016-04-23 DIAGNOSIS — Z823 Family history of stroke: Secondary | ICD-10-CM | POA: Diagnosis not present

## 2016-04-23 DIAGNOSIS — I5032 Chronic diastolic (congestive) heart failure: Secondary | ICD-10-CM | POA: Diagnosis present

## 2016-04-23 DIAGNOSIS — Z7951 Long term (current) use of inhaled steroids: Secondary | ICD-10-CM | POA: Diagnosis not present

## 2016-04-23 DIAGNOSIS — Z825 Family history of asthma and other chronic lower respiratory diseases: Secondary | ICD-10-CM

## 2016-04-23 DIAGNOSIS — J441 Chronic obstructive pulmonary disease with (acute) exacerbation: Principal | ICD-10-CM | POA: Diagnosis present

## 2016-04-23 DIAGNOSIS — J9610 Chronic respiratory failure, unspecified whether with hypoxia or hypercapnia: Secondary | ICD-10-CM | POA: Diagnosis present

## 2016-04-23 DIAGNOSIS — R0602 Shortness of breath: Secondary | ICD-10-CM | POA: Diagnosis not present

## 2016-04-23 LAB — BASIC METABOLIC PANEL
Anion gap: 4 — ABNORMAL LOW (ref 5–15)
BUN: 17 mg/dL (ref 6–20)
CO2: 31 mmol/L (ref 22–32)
CREATININE: 0.7 mg/dL (ref 0.44–1.00)
Calcium: 9.2 mg/dL (ref 8.9–10.3)
Chloride: 106 mmol/L (ref 101–111)
GFR calc Af Amer: >60 mL/min (ref >60)
Glucose, Bld: 99 mg/dL (ref 65–99)
Potassium: 4.2 mmol/L (ref 3.5–5.1)
SODIUM: 141 mmol/L (ref 135–145)

## 2016-04-23 LAB — CBC
HCT: 43.8 % (ref 35.0–47.0)
Hemoglobin: 14.2 g/dL (ref 12.0–16.0)
MCH: 28.8 pg (ref 26.0–34.0)
MCHC: 32.5 g/dL (ref 32.0–36.0)
MCV: 88.6 fL (ref 80.0–100.0)
PLATELETS: 172 10*3/uL (ref 150–440)
RBC: 4.94 MIL/uL (ref 3.80–5.20)
RDW: 14.7 % — AB (ref 11.5–14.5)
WBC: 3.9 10*3/uL (ref 3.6–11.0)

## 2016-04-23 LAB — TROPONIN I: Troponin I: <0.03 ng/mL (ref <0.03)

## 2016-04-23 MED ORDER — ASPIRIN EC 81 MG PO TBEC
81.0000 mg | DELAYED_RELEASE_TABLET | Freq: Every day | ORAL | Status: DC
  Administered 2016-04-24 – 2016-04-27 (×4): 81 mg via ORAL
  Filled 2016-04-23 (×4): qty 1

## 2016-04-23 MED ORDER — LORAZEPAM 1 MG PO TABS
1.0000 mg | ORAL_TABLET | ORAL | Status: DC | PRN
  Administered 2016-04-24 – 2016-04-26 (×3): 1 mg via ORAL
  Filled 2016-04-23 (×3): qty 1

## 2016-04-23 MED ORDER — ZOLPIDEM TARTRATE 5 MG PO TABS
5.0000 mg | ORAL_TABLET | Freq: Every evening | ORAL | Status: DC | PRN
  Administered 2016-04-23 – 2016-04-26 (×4): 5 mg via ORAL
  Filled 2016-04-23 (×4): qty 1

## 2016-04-23 MED ORDER — ACETAMINOPHEN 325 MG PO TABS
650.0000 mg | ORAL_TABLET | Freq: Four times a day (QID) | ORAL | Status: DC | PRN
  Administered 2016-04-25: 650 mg via ORAL
  Filled 2016-04-23: qty 2

## 2016-04-23 MED ORDER — ENOXAPARIN SODIUM 30 MG/0.3ML ~~LOC~~ SOLN
30.0000 mg | Freq: Every day | SUBCUTANEOUS | Status: DC
  Administered 2016-04-23 – 2016-04-24 (×2): 30 mg via SUBCUTANEOUS
  Filled 2016-04-23 (×2): qty 0.3

## 2016-04-23 MED ORDER — LORAZEPAM 1 MG PO TABS
ORAL_TABLET | ORAL | Status: AC
  Filled 2016-04-23: qty 1

## 2016-04-23 MED ORDER — ONDANSETRON HCL 4 MG PO TABS: 4.0000 mg | ORAL_TABLET | Freq: Four times a day (QID) | ORAL | Status: DC | PRN

## 2016-04-23 MED ORDER — IPRATROPIUM-ALBUTEROL 0.5-2.5 (3) MG/3ML IN SOLN
3.0000 mL | RESPIRATORY_TRACT | Status: DC
  Administered 2016-04-24 – 2016-04-27 (×19): 3 mL via RESPIRATORY_TRACT
  Filled 2016-04-23 (×20): qty 3

## 2016-04-23 MED ORDER — ONDANSETRON HCL 4 MG/2ML IJ SOLN: 4.0000 mg | Freq: Four times a day (QID) | INTRAMUSCULAR | Status: DC | PRN

## 2016-04-23 MED ORDER — GUAIFENESIN-CODEINE 100-10 MG/5ML PO SOLN
10.0000 mL | ORAL | Status: DC | PRN
  Administered 2016-04-24 – 2016-04-27 (×4): 10 mL via ORAL
  Filled 2016-04-23 (×4): qty 10

## 2016-04-23 MED ORDER — MOMETASONE FURO-FORMOTEROL FUM 200-5 MCG/ACT IN AERO
2.0000 | INHALATION_SPRAY | Freq: Two times a day (BID) | RESPIRATORY_TRACT | Status: DC
  Administered 2016-04-23 – 2016-04-27 (×8): 2 via RESPIRATORY_TRACT
  Filled 2016-04-23: qty 8.8

## 2016-04-23 MED ORDER — AZITHROMYCIN 250 MG PO TABS
500.0000 mg | ORAL_TABLET | Freq: Once | ORAL | Status: AC
  Administered 2016-04-23: 500 mg via ORAL
  Filled 2016-04-23: qty 2

## 2016-04-23 MED ORDER — SIMVASTATIN 20 MG PO TABS
20.0000 mg | ORAL_TABLET | Freq: Every day | ORAL | Status: DC
  Administered 2016-04-23 – 2016-04-26 (×4): 20 mg via ORAL
  Filled 2016-04-23 (×4): qty 1

## 2016-04-23 MED ORDER — METHYLPREDNISOLONE SODIUM SUCC 125 MG IJ SOLR
60.0000 mg | INTRAMUSCULAR | Status: DC
  Administered 2016-04-23 – 2016-04-24 (×2): 60 mg via INTRAVENOUS
  Filled 2016-04-23 (×2): qty 2

## 2016-04-23 MED ORDER — IPRATROPIUM-ALBUTEROL 0.5-2.5 (3) MG/3ML IN SOLN: 3.0000 mL | RESPIRATORY_TRACT | Status: DC

## 2016-04-23 MED ORDER — ENALAPRIL MALEATE 2.5 MG PO TABS
5.0000 mg | ORAL_TABLET | Freq: Every day | ORAL | Status: DC
  Administered 2016-04-24 – 2016-04-27 (×4): 5 mg via ORAL
  Filled 2016-04-23 (×4): qty 2

## 2016-04-23 MED ORDER — IPRATROPIUM-ALBUTEROL 0.5-2.5 (3) MG/3ML IN SOLN
3.0000 mL | Freq: Once | RESPIRATORY_TRACT | Status: AC
  Administered 2016-04-23: 3 mL via RESPIRATORY_TRACT
  Filled 2016-04-23 (×2): qty 3

## 2016-04-23 MED ORDER — CARVEDILOL 3.125 MG PO TABS
3.1250 mg | ORAL_TABLET | ORAL | Status: DC
  Administered 2016-04-24 – 2016-04-27 (×4): 3.125 mg via ORAL
  Filled 2016-04-23 (×4): qty 1

## 2016-04-23 MED ORDER — LORAZEPAM 1 MG PO TABS
1.0000 mg | ORAL_TABLET | Freq: Once | ORAL | Status: AC
  Administered 2016-04-23: 1 mg via ORAL

## 2016-04-23 MED ORDER — ENSURE ENLIVE PO LIQD
237.0000 mL | Freq: Two times a day (BID) | ORAL | Status: DC
  Administered 2016-04-24 – 2016-04-27 (×5): 237 mL via ORAL

## 2016-04-23 MED ORDER — OXYCODONE HCL 5 MG PO TABS
5.0000 mg | ORAL_TABLET | ORAL | Status: DC | PRN
  Administered 2016-04-23 – 2016-04-27 (×5): 5 mg via ORAL
  Filled 2016-04-23 (×6): qty 1

## 2016-04-23 MED ORDER — TIOTROPIUM BROMIDE MONOHYDRATE 18 MCG IN CAPS
18.0000 ug | ORAL_CAPSULE | Freq: Every day | RESPIRATORY_TRACT | Status: DC
  Administered 2016-04-23 – 2016-04-27 (×5): 18 ug via RESPIRATORY_TRACT
  Filled 2016-04-23: qty 5

## 2016-04-23 MED ORDER — ACETAMINOPHEN 650 MG RE SUPP
650.0000 mg | Freq: Four times a day (QID) | RECTAL | Status: DC | PRN
  Filled 2016-04-23: qty 1

## 2016-04-23 MED ORDER — PREDNISONE 20 MG PO TABS
60.0000 mg | ORAL_TABLET | Freq: Once | ORAL | Status: AC
  Administered 2016-04-23: 60 mg via ORAL
  Filled 2016-04-23: qty 3

## 2016-04-23 NOTE — ED Triage Notes (Signed)
Patient presents to the ED with cough, congestion, and shortness of breath x 3-4 days, worse today.  Patient attempted to go to the PCP but became very short of breath at the office and was sent to the ED.  Patient is having slight difficulty speaking in full sentences.  Patient has history of copd.  Patient is on 2L [place] at [place].  Patient is eating a snack in triage.

## 2016-04-23 NOTE — Progress Notes (Signed)
Enoxaparin has been changed to 30 mg subcutaneously daily for low body weight.   Lilo [place] A. [place], [place].D., BCPS Clinical Pharmacist 04/23/2016 2136

## 2016-04-23 NOTE — ED Provider Notes (Signed)
[facility] Emergency Department Provider Note  Time seen: 6:03 PM  I have reviewed the triage vital signs and the nursing notes.   HISTORY  Chief Complaint Shortness of Breath    HPI Michelle [name] is a 55 y.o. female with a past medical history of COPD, hypertension, hyperlipidemia, asthma, presents to the emergency department with difficulty breathing. According to the patient for the past 3 days she has been having progressively worse difficulty breathing along with a dry cough. Denies fever. Denies chest pain. Patient wears 2 L of oxygen 24/7.  Past Medical History:  Diagnosis Date  . Allergy   . Anxiety   . Asthma   . CHF (congestive heart failure) ([place])   . Cocaine abuse   . COPD (chronic obstructive pulmonary disease) ([place])   . Coronary artery disease   . Emphysema/COPD ([place])   . Hepatitis C   . High cholesterol   . Hypertension   . Pneumothorax   . Tobacco abuse     Patient Active Problem List   Diagnosis Date Noted  . Acute respiratory failure ([place]) 02/07/2016  . Acute on chronic respiratory failure with hypercapnia ([place]) 11/29/2015  . Malnutrition of moderate degree ([place]) 10/25/2014  . COPD exacerbation ([place]) 10/24/2014  . Acute respiratory failure with hypoxia ([place]) 10/24/2014  . Tobacco abuse 10/24/2014  . Community acquired pneumonia 10/24/2014    Past Surgical History:  Procedure Laterality Date  . CARDIAC CATHETERIZATION    . CHEST TUBE INSERTION      Prior to Admission medications   Medication Sig Start Date End Date Taking? Authorizing Provider  albuterol (PROVENTIL HFA;VENTOLIN HFA) 108 (90 Base) MCG/ACT inhaler Inhale 2 puffs into the lungs every 6 (six) hours as needed for wheezing or shortness of breath. 11/07/15   Hillary Bow, MD  aspirin EC 81 MG tablet Take 81 mg by mouth daily.    Historical Provider, MD  benzonatate (TESSALON) 100 MG capsule Take 1 capsule (100 mg total) by mouth 3 (three) times daily. Patient  not taking: Reported on 04/23/2016 02/10/16   Gladstone Lighter, MD  carvedilol (COREG) 3.125 MG tablet Take 1 tablet (3.125 mg total) by mouth every morning. 02/10/16   Gladstone Lighter, MD  cefUROXime (CEFTIN) 500 MG tablet Take 1 tablet (500 mg total) by mouth 2 (two) times daily with a meal. X 5 more days Patient not taking: Reported on 04/23/2016 02/10/16   Gladstone Lighter, MD  enalapril (VASOTEC) 10 MG tablet Take 0.5 tablets (5 mg total) by mouth daily. 02/10/16   Gladstone Lighter, MD  feeding supplement, ENSURE ENLIVE, (ENSURE ENLIVE) LIQD Take 237 mLs by mouth 2 (two) times daily between meals. 10/27/14   Nicholes Mango, MD  Fluticasone-Salmeterol (ADVAIR) 250-50 MCG/DOSE AEPB Inhale 1 puff into the lungs 2 (two) times daily. 02/10/16   Gladstone Lighter, MD  guaiFENesin-dextromethorphan (ROBITUSSIN DM) 100-10 MG/5ML syrup Take 10 mLs by mouth every 8 (eight) hours as needed for cough. Patient not taking: Reported on 04/23/2016 10/27/14   Nicholes Mango, MD  ipratropium-albuterol (DUONEB) 0.5-2.5 (3) MG/3ML SOLN Take 3 mLs by nebulization every 4 (four) hours as needed. 02/10/16   Gladstone Lighter, MD  LORazepam (ATIVAN) 1 MG tablet Take 1 tablet (1 mg total) by mouth 2 (two) times daily as needed for anxiety. 02/10/16   Gladstone Lighter, MD  nicotine (NICODERM CQ - DOSED IN MG/24 HOURS) 14 mg/24hr patch Place 1 patch (14 mg total) onto the skin daily. Patient not taking: Reported on 04/23/2016  12/01/15   Bettey Costa, MD  predniSONE (STERAPRED UNI-PAK 21 TAB) 10 MG (21) TBPK tablet Take 1 tablet (10 mg total) by mouth daily. 6 tabs PO x 1 day 5 tabs PO x 1 day 4 tabs PO x 1 day 3 tabs PO x 1 day 2 tabs PO x 1 day 1 tab PO x 1 day and stop Patient not taking: Reported on 04/23/2016 02/10/16   Gladstone Lighter, MD  simvastatin (ZOCOR) 20 MG tablet Take 1 tablet (20 mg total) by mouth at bedtime. 02/10/16 03/17/16  Gladstone Lighter, MD  tiotropium (SPIRIVA) 18 MCG inhalation capsule Place 1 capsule (18 mcg  total) into inhaler and inhale daily. 02/10/16   Gladstone Lighter, MD    No Known Allergies  Family History  Problem Relation Age of Onset  . Lung cancer Mother   . Asthma Mother   . CVA Father   . CAD Father   . Stroke Father   . Breast cancer Maternal Aunt 67  . COPD Sister     Social History Social History  Substance Use Topics  . Smoking status: Current Every Day Smoker    Packs/day: 0.25    Years: 41.00    Types: Cigarettes  . Smokeless tobacco: Never Used  . Alcohol use No    Review of Systems Constitutional: Negative for fever. Cardiovascular: Negative for chest pain. Respiratory: Positive for shortness of breath. Gastrointestinal: Negative for abdominal pain Musculoskeletal: Negative for back pain. Negative for leg pain or swelling. Neurological: Negative for headache 10-point ROS otherwise negative.  ____________________________________________   PHYSICAL EXAM:  VITAL SIGNS: ED Triage Vitals  Enc Vitals Group     BP 04/23/16 1547 (!) 117/92     Pulse Rate 04/23/16 1547 69     Resp 04/23/16 1547 (!) 24     Temp 04/23/16 1549 98.2 F (36.8 C)     Temp Source 04/23/16 1549 Oral     SpO2 04/23/16 1547 100 %     Weight 04/23/16 1548 77 lb (34.9 kg)     Height 04/23/16 1548 4\' 9"  (1.448 m)     Head Circumference --      Peak Flow --      Pain Score 04/23/16 1601 7     Pain Loc --      Pain Edu? --      Excl. in [place]? --     Constitutional: Alert and oriented. Well appearing and in no distress. Eyes: Normal exam ENT   Head: Normocephalic and atraumatic.   Mouth/Throat: Mucous membranes are moist. Cardiovascular: Normal rate, regular rhythm. No murmur Respiratory: Moderate expiratory wheeze bilaterally. No rales or rhonchi. Gastrointestinal: Soft and nontender. No distention.   Musculoskeletal: Nontender with normal range of motion in all extremities. No lower extremity tenderness or edema. Neurologic:  Normal speech and language. No gross  focal neurologic deficits  Skin:  Skin is warm, dry and intact.  Psychiatric: Mood and affect are normal. Speech and behavior are normal.   ____________________________________________    EKG  EKG reviewed and interpreted, so shows normal sinus rhythm at 72 bpm, narrow QRS, normal axis, normal intervals, no ST changes. Reassuring EKG.  ____________________________________________    RADIOLOGY  Chest x-ray consistent with COPD but no active change/disease.  ____________________________________________   INITIAL IMPRESSION / ASSESSMENT AND PLAN / ED COURSE  Pertinent labs & imaging results that were available during my care of the patient were reviewed by me and considered in my medical decision making (see  chart for details).  Patient presents the emergency department with 3 days progressively increased difficulty breathing. States a dry cough. Denies fever. Denies chest pain. Overall patient appears well, no distress but she does appear somewhat short of breath. Patient has expiratory wheeze bilaterally. We will treat with prednisone, DuoNeb's, and Zithromax. We'll closely monitor in the emergency department. Patient's labs are reassuring including negative troponin, normal chest x-ray and a well-appearing EKG.  Patient continues to have difficulty breathing. States she feels worse than she did when she got here. She has received her breathing treatments, steroids. Patient attempted to walk to the bathroom but became too short of breath and had to stop. Patient with significant tachypnea after ambulating. Given her continued wheeze with work of breathing and tachypnea will admit the patient for likely COPD exacerbation.  ____________________________________________   FINAL CLINICAL IMPRESSION(S) / ED DIAGNOSES  COPD exacerbation    Harvest Dark, MD 04/23/16 1931

## 2016-04-23 NOTE — ED Notes (Signed)
Pt provided with a meal tray.

## 2016-04-23 NOTE — ED Notes (Signed)
CBC collected, sent to lab.

## 2016-04-23 NOTE — H&P (Signed)
[place] [facility] [place] NAME: Michelle [name]    MR#:  [id number]  DATE OF BIRTH:  1961-05-03   DATE OF ADMISSION:  04/23/2016  PRIMARY CARE PHYSICIAN: No primary care provider on file.   REQUESTING/REFERRING PHYSICIAN: Paduchowski  CHIEF COMPLAINT:   Chief Complaint  Patient presents with  . Shortness of Breath    HISTORY OF PRESENT ILLNESS:  Michelle [name]  is a 55 y.o. female with a known history of COPD, chronic respiratory failure on 2 L nasal Baseline who is presenting with shortness breath. She states three-day duration of URI-like symptoms including nasal congestion, nonproductive cough. sHe developed worsening shortness of breath over the past few days  PAST MEDICAL HISTORY:   Past Medical History:  Diagnosis Date  . Allergy   . Anxiety   . Asthma   . CHF (congestive heart failure) ([place])   . Cocaine abuse   . COPD (chronic obstructive pulmonary disease) ([place])   . Coronary artery disease   . Emphysema/COPD ([place])   . Hepatitis C   . High cholesterol   . Hypertension   . Pneumothorax   . Tobacco abuse     PAST SURGICAL HISTORY:   Past Surgical History:  Procedure Laterality Date  . CARDIAC CATHETERIZATION    . CHEST TUBE INSERTION      SOCIAL HISTORY:   Social History  Substance Use Topics  . Smoking status: Current Every Day Smoker    Packs/day: 0.25    Years: 41.00    Types: Cigarettes  . Smokeless tobacco: Never Used  . Alcohol use No    FAMILY HISTORY:   Family History  Problem Relation Age of Onset  . Lung cancer Mother   . Asthma Mother   . CVA Father   . CAD Father   . Stroke Father   . Breast cancer Maternal Aunt 67  . COPD Sister     DRUG ALLERGIES:  No Known Allergies  REVIEW OF SYSTEMS:  REVIEW OF SYSTEMS:  CONSTITUTIONAL: Denies fevers, chills, fatigue, weakness.  EYES: Denies blurred vision, double vision, or eye pain.  EARS, NOSE, THROAT: Denies tinnitus, ear pain, hearing loss.    RESPIRATORY: Positive cough, shortness of breath, wheezing  CARDIOVASCULAR: Denies chest pain, palpitations, edema.  GASTROINTESTINAL: Denies nausea, vomiting, diarrhea, abdominal pain.  GENITOURINARY: Denies dysuria, hematuria.  ENDOCRINE: Denies nocturia or thyroid problems. HEMATOLOGIC AND LYMPHATIC: Denies easy bruising or bleeding.  SKIN: Denies rash or lesions.  MUSCULOSKELETAL: Denies pain in neck, back, shoulder, knees, hips, or further arthritic symptoms.  NEUROLOGIC: Denies paralysis, paresthesias.  PSYCHIATRIC: Denies anxiety or depressive symptoms. Otherwise full review of systems performed by me is negative.   MEDICATIONS AT HOME:   Prior to Admission medications   Medication Sig Start Date End Date Taking? Authorizing Provider  albuterol (PROVENTIL HFA;VENTOLIN HFA) 108 (90 Base) MCG/ACT inhaler Inhale 2 puffs into the lungs every 6 (six) hours as needed for wheezing or shortness of breath. 11/07/15  Yes Srikar Sudini, MD  aspirin EC 81 MG tablet Take 81 mg by mouth daily.   Yes Historical Provider, MD  carvedilol (COREG) 3.125 MG tablet Take 1 tablet (3.125 mg total) by mouth every morning. 02/10/16  Yes Gladstone Lighter, MD  enalapril (VASOTEC) 10 MG tablet Take 0.5 tablets (5 mg total) by mouth daily. 02/10/16  Yes Gladstone Lighter, MD  Fluticasone-Salmeterol (ADVAIR) 250-50 MCG/DOSE AEPB Inhale 1 puff into the lungs 2 (two) times daily. 02/10/16  Yes Radhika  Tressia Miners, MD  ipratropium-albuterol (DUONEB) 0.5-2.5 (3) MG/3ML SOLN Take 3 mLs by nebulization every 4 (four) hours as needed. 02/10/16  Yes Gladstone Lighter, MD  LORazepam (ATIVAN) 1 MG tablet Take 1 tablet (1 mg total) by mouth 2 (two) times daily as needed for anxiety. 02/10/16  Yes Gladstone Lighter, MD  simvastatin (ZOCOR) 20 MG tablet Take 1 tablet (20 mg total) by mouth at bedtime. 02/10/16 04/23/16 Yes Gladstone Lighter, MD  tiotropium (SPIRIVA) 18 MCG inhalation capsule Place 1 capsule (18 mcg total) into inhaler and  inhale daily. 02/10/16  Yes Gladstone Lighter, MD  feeding supplement, ENSURE ENLIVE, (ENSURE ENLIVE) LIQD Take 237 mLs by mouth 2 (two) times daily between meals. 10/27/14   Nicholes Mango, MD      VITAL SIGNS:  Blood pressure (!) 117/92, pulse 84, temperature 98.2 F (36.8 C), temperature source Oral, resp. rate (!) 24, height 4\' 9"  (1.448 m), weight 34.9 kg (77 lb), last menstrual period 03/06/2005, SpO2 100 %.  PHYSICAL EXAMINATION:  VITAL SIGNS: Vitals:   04/23/16 1549 04/23/16 1842  BP:    Pulse:  84  Resp:    Temp: 98.2 F (36.8 C)    GENERAL:55 y.o.female currently in no acute distress.  HEAD: Normocephalic, atraumatic.  EYES: Pupils equal, round, reactive to light. Extraocular muscles intact. No scleral icterus.  MOUTH: Moist mucosal membrane. Dentition intact. No abscess noted.  EAR, NOSE, THROAT: Clear without exudates. No external lesions.  NECK: Supple. No thyromegaly. No nodules. No JVD.  PULMONARY: Greatly diminished breath sounds scant expiratory wheeze scattered basilar rhonchi No use of accessory muscles, Good respiratory effort. good air entry bilaterally CHEST: Nontender to palpation.  CARDIOVASCULAR: S1 and S2. Regular rate and rhythm. No murmurs, rubs, or gallops. No edema. Pedal pulses 2+ bilaterally.  GASTROINTESTINAL: Soft, nontender, nondistended. No masses. Positive bowel sounds. No hepatosplenomegaly.  MUSCULOSKELETAL: No swelling, clubbing, or edema. Range of motion full in all extremities.  NEUROLOGIC: Cranial nerves II through XII are intact. No gross focal neurological deficits. Sensation intact. Reflexes intact.  SKIN: No ulceration, lesions, rashes, or cyanosis. Skin warm and dry. Turgor intact.  PSYCHIATRIC: Mood, affect within normal limits. The patient is awake, alert and oriented x 3. Insight, judgment intact.    LABORATORY PANEL:   CBC  Recent Labs Lab 04/23/16 1603  WBC 3.9  HGB 14.2  HCT 43.8  PLT 172    ------------------------------------------------------------------------------------------------------------------  Chemistries   Recent Labs Lab 04/23/16 1603  NA 141  K 4.2  CL 106  CO2 31  GLUCOSE 99  BUN 17  CREATININE 0.70  CALCIUM 9.2   ------------------------------------------------------------------------------------------------------------------  Cardiac Enzymes  Recent Labs Lab 04/23/16 1603  TROPONINI <0.03   ------------------------------------------------------------------------------------------------------------------  RADIOLOGY:  Dg Chest 2 View  Result Date: 04/23/2016 CLINICAL DATA:  Shortness of breath, cough, fever EXAM: CHEST  2 VIEW COMPARISON:  02/07/2016 FINDINGS: There is hyperinflation of the lungs compatible with COPD. Heart and mediastinal contours are within normal limits. No focal opacities or effusions. No acute bony abnormality. IMPRESSION: COPD.  No active disease. Electronically Signed   By: Rolm Baptise M.D.   On: 04/23/2016 16:49    EKG:   Orders placed or performed during the hospital encounter of 04/23/16  . ED EKG within 10 minutes  . ED EKG within 10 minutes  . EKG 12-Lead  . EKG 12-Lead    IMPRESSION AND PLAN:   55 year old female history of COPD chronic respiratory failure presenting with shortness of breath  1.Chronic obstructive pulmonary  disease exacerbation: Provide DuoNeb treatments q. 4 hours, Solu-Medrol 60 mg IV q. daily, no current indication for azithromycin. Continue with home medications.   2. Hyperlipidemia: Statin therapy 3. Essential hypertension Coreg, enalapril     All the records are reviewed and case discussed with ED provider. Management plans discussed with the patient, family and they are in agreement.  CODE STATUS: Full  TOTAL TIME TAKING CARE OF THIS PATIENT: 33 minutes.    Candida Vetter,  Karenann Cai.D on 04/23/2016 at 8:34 PM  Between 7am to 6pm - Pager - [phone]  After 6pm: House  Pager: - [phone]  [facility]  Office  [phone]  CC: Primary care physician; No primary care provider on file.

## 2016-04-23 NOTE — ED Notes (Signed)
Pt reports she feels like she is having a panic attack and would like something for anxiety. Dr. Kerman Passey notified and orders received.

## 2016-04-23 NOTE — Progress Notes (Signed)
Medication Management Clinic Visit Note  Patient: Michelle [name] MRN: [id number] Date of Birth: [birth year] PCP: No primary care provider on file.   Michelle [name] 55 y.o. female presents for an MTM visit today.  BP 110/70 (BP Location: Right Arm)   Wt 70 lb (31.8 kg)   LMP 03/06/2005 (Approximate)   BMI 13.23 kg/m   Patient Information   Past Medical History:  Diagnosis Date  . Allergy   . Anxiety   . Asthma   . CHF (congestive heart failure) ([place])   . Cocaine abuse   . COPD (chronic obstructive pulmonary disease) ([place])   . Coronary artery disease   . Emphysema/COPD ([place])   . High cholesterol   . Hypertension   . Pneumothorax   . Tobacco abuse       Past Surgical History:  Procedure Laterality Date  . CARDIAC CATHETERIZATION    . CHEST TUBE INSERTION       Family History  Problem Relation Age of Onset  . Lung cancer Mother   . Asthma Mother   . CVA Father   . CAD Father   . Stroke Father   . Breast cancer Maternal Aunt 67  . COPD Sister     New Diagnoses (since last visit): none  Family Support: Poor  Lifestyle Diet: Breakfast: bacon, eggs, ensure (3-4x week) Lunch: ensure (3-4x week), chicken Dinner: ensure (3-4x week), mostly fried chicken, beans/corn, potatoes Drinks: water, pepsi       Exercise limited by: respiratory conditions(s)    History  Alcohol Use No      History  Smoking Status  . Current Every Day Smoker  . Packs/day: 0.25  . Years: 41.00  . Types: Cigarettes  Smokeless Tobacco  . Never Used      Health Maintenance  Topic Date Due  . Hepatitis C Screening  [birth year]  . HIV Screening  06/16/1976  . TETANUS/TDAP  06/16/1980  . PAP SMEAR  06/16/1982  . COLONOSCOPY  06/17/2011  . INFLUENZA VACCINE  01/03/2016  . MAMMOGRAM  04/14/2017   Prior to Admission medications   Medication Sig Start Date End Date Taking? Authorizing Provider  albuterol (PROVENTIL HFA;VENTOLIN HFA) 108 (90 Base) MCG/ACT inhaler Inhale 2 puffs  into the lungs every 6 (six) hours as needed for wheezing or shortness of breath. 11/07/15  Yes Srikar Sudini, MD  aspirin EC 81 MG tablet Take 81 mg by mouth daily.   Yes Historical Provider, MD  carvedilol (COREG) 3.125 MG tablet Take 1 tablet (3.125 mg total) by mouth every morning. 02/10/16  Yes Gladstone Lighter, MD  enalapril (VASOTEC) 10 MG tablet Take 0.5 tablets (5 mg total) by mouth daily. 02/10/16  Yes Gladstone Lighter, MD  feeding supplement, ENSURE ENLIVE, (ENSURE ENLIVE) LIQD Take 237 mLs by mouth 2 (two) times daily between meals. 10/27/14  Yes Nicholes Mango, MD  Fluticasone-Salmeterol (ADVAIR) 250-50 MCG/DOSE AEPB Inhale 1 puff into the lungs 2 (two) times daily. 02/10/16  Yes Gladstone Lighter, MD  ipratropium-albuterol (DUONEB) 0.5-2.5 (3) MG/3ML SOLN Take 3 mLs by nebulization every 4 (four) hours as needed. 02/10/16  Yes Gladstone Lighter, MD  LORazepam (ATIVAN) 1 MG tablet Take 1 tablet (1 mg total) by mouth 2 (two) times daily as needed for anxiety. 02/10/16  Yes Gladstone Lighter, MD  tiotropium (SPIRIVA) 18 MCG inhalation capsule Place 1 capsule (18 mcg total) into inhaler and inhale daily. 02/10/16  Yes Gladstone Lighter, MD  benzonatate (TESSALON) 100 MG capsule Take 1 capsule (100  mg total) by mouth 3 (three) times daily. Patient not taking: Reported on 04/23/2016 02/10/16   Gladstone Lighter, MD  cefUROXime (CEFTIN) 500 MG tablet Take 1 tablet (500 mg total) by mouth 2 (two) times daily with a meal. X 5 more days Patient not taking: Reported on 04/23/2016 02/10/16   Gladstone Lighter, MD  guaiFENesin-dextromethorphan (ROBITUSSIN DM) 100-10 MG/5ML syrup Take 10 mLs by mouth every 8 (eight) hours as needed for cough. Patient not taking: Reported on 04/23/2016 10/27/14   Nicholes Mango, MD  nicotine (NICODERM CQ - DOSED IN MG/24 HOURS) 14 mg/24hr patch Place 1 patch (14 mg total) onto the skin daily. Patient not taking: Reported on 04/23/2016 12/01/15   Bettey Costa, MD  predniSONE (STERAPRED UNI-PAK  21 TAB) 10 MG (21) TBPK tablet Take 1 tablet (10 mg total) by mouth daily. 6 tabs PO x 1 day 5 tabs PO x 1 day 4 tabs PO x 1 day 3 tabs PO x 1 day 2 tabs PO x 1 day 1 tab PO x 1 day and stop Patient not taking: Reported on 04/23/2016 02/10/16   Gladstone Lighter, MD  simvastatin (ZOCOR) 20 MG tablet Take 1 tablet (20 mg total) by mouth at bedtime. 02/10/16 03/17/16  Gladstone Lighter, MD     Assessment and Plan: 55yo F who presents for a follow-up MTM. Patient has her medications with her this visit and admits to being compliant with medications as best as she can. Admits to not taking her Advair inhaler twice a day and uses albuterol more frequently lately. She has been feeling more weak and tired the past few days. She is coughing up yellow pus and has had trouble breathing. During the visit she was sitting in a tripod position to help with breathing.  COPD/asthma: Pt presents SOB, tired, weak, and coughing up sputum. Wears 2 L of O2. Recommended that patient goes to the hospital as this is most likely a COPD exacerbation and needs antibiotics and steroids. She is currently taking albuterol, Tudorza in place of Spiriva at this time, Advair, and duonebs at home. Pt not compliant with Advair inhaler as she only takes this sometimes and was last filled in 2016. She was given a refill today on Tudorza. She admits to using albuterol more frequently (>4x/day). Ms. Hession was counseled on the importance of maintanence inhalers vs. Rescue inhalers.  CHF: On enalapril, carvedilol, and ASA. Pt is not on a diuretic at this time. Monitor daily weights and signs of edema.  Tobacco use/substance use: Continues to smoke 1 cig/day which is down from 1/2 per/day. Recommended to quit as she is on oxygen. Pt informed of the dangers of smoking while on oxygen. She denies substance abuse at this time.  HTN: BP 110/70 is within goal of <130/80 per the 2017 HTN Guidelines. Pt is on enalapril for BP. Recommended that patient  checks BP at home to get accurate readings of BP. High cholesterol: Last cholesterol panel in 2015. Would recommend taking a new lipid panel. Currently taking simvastatin 20mg .  Weight: Pt has a BMI of 13.23. She does not eat much she says and tries to take ensure about 3-4x a week for breakfast, lunch, and dinner. Recommend to continue taking ensures.   Loree Fee, PharmD 4:12 PM 04/23/2016

## 2016-04-24 LAB — MRSA PCR SCREENING: MRSA BY PCR: NEGATIVE

## 2016-04-24 MED ORDER — LORATADINE 10 MG PO TABS
10.0000 mg | ORAL_TABLET | Freq: Every day | ORAL | Status: DC
  Administered 2016-04-24 – 2016-04-27 (×4): 10 mg via ORAL
  Filled 2016-04-24 (×4): qty 1

## 2016-04-24 MED ORDER — FLUTICASONE PROPIONATE 50 MCG/ACT NA SUSP
2.0000 | Freq: Every day | NASAL | Status: DC
  Administered 2016-04-24 – 2016-04-26 (×3): 2 via NASAL
  Filled 2016-04-24: qty 16

## 2016-04-24 MED ORDER — BOOST / RESOURCE BREEZE PO LIQD
1.0000 | Freq: Three times a day (TID) | ORAL | Status: DC
  Administered 2016-04-24 – 2016-04-27 (×8): 1 via ORAL

## 2016-04-24 NOTE — Progress Notes (Signed)
Initial Nutrition Assessment  DOCUMENTATION CODES:   Underweight  INTERVENTION:  1. Boost Breeze po TID, each supplement provides 250 kcal and 9 grams of protein 2. Ensure Enlive po BID, each supplement provides 350 kcal and 20 grams of protein  NUTRITION DIAGNOSIS:   Inadequate oral intake related to poor appetite, chronic illness as evidenced by per patient/family report.  GOAL:   Patient will meet greater than or equal to 90% of their needs  MONITOR:   PO intake, I & O's, Labs, Weight trends, Supplement acceptance  REASON FOR ASSESSMENT:   Malnutrition Screening Tool    ASSESSMENT:   Michelle [name]  is a 55 y.o. female with a known history of COPD, chronic respiratory failure on 2 L nasal Baseline who is presenting with shortness breath. She states three-day duration of URI-like symptoms including nasal congestion, nonproductive cough.  Spoke with [name] at bedside. She states she has an ok appetite. Eats 1-2 meals per day at home. Biggest complaint is "I get full quickly and then I can't breathe." Does not seem patient is suffering from a choking issue, but that her respiratory status declines when she tries to eat.  Nutrition-Focused physical exam completed. Findings are no fat depletion, no muscle depletion, and no edema.   Meal completion 100% thus far.  Labs and medications reviewed.  Diet Order:  Diet Heart Room service appropriate? Yes; Fluid consistency: Thin  Skin:  Reviewed, no issues  Last BM:  04/23/2016  Height:   Ht Readings from Last 1 Encounters:  04/23/16 4\' 9"  (1.448 m)    Weight:   Wt Readings from Last 1 Encounters:  04/23/16 85 lb 8 oz (38.8 kg)    Ideal Body Weight:  38.63 kg  BMI:  Body mass index is 18.5 kg/m.  Estimated Nutritional Needs:   Kcal:  1000-1200 calories  Protein:  46-58 gm  Fluid:  >/= 1L  EDUCATION NEEDS:   No education needs identified at this time  Satira Anis. Guage Efferson, MS, RD LDN Inpatient Clinical  Dietitian Pager [phone]

## 2016-04-24 NOTE — Progress Notes (Signed)
[place] at [place] NAME: Michelle [name]    MR#:  [id number]  DATE OF BIRTH:  1961-02-04  SUBJECTIVE:  CHIEF COMPLAINT:   Chief Complaint  Patient presents with  . Shortness of Breath   Better SOB, wheezing and cough. REVIEW OF SYSTEMS:  Review of Systems  Constitutional: Positive for malaise/fatigue. Negative for chills and fever.  HENT: Negative for congestion.   Eyes: Negative for blurred vision.  Respiratory: Positive for cough, sputum production, shortness of breath and wheezing. Negative for hemoptysis and stridor.   Cardiovascular: Negative for chest pain and leg swelling.  Gastrointestinal: Negative for abdominal pain, blood in stool, diarrhea, melena, nausea and vomiting.  Genitourinary: Negative for dysuria and hematuria.  Musculoskeletal: Negative for joint pain.  Skin: Negative for rash.  Neurological: Negative for dizziness, focal weakness and loss of consciousness.  Psychiatric/Behavioral: Negative for depression. The patient is not nervous/anxious.     DRUG ALLERGIES:  No Known Allergies VITALS:  Blood pressure 126/74, pulse 78, temperature 97.4 F (36.3 C), temperature source Oral, resp. rate 20, height 4\' 9"  (1.448 m), weight 85 lb 8 oz (38.8 kg), last menstrual period 03/06/2005, SpO2 99 %. PHYSICAL EXAMINATION:  Physical Exam  Constitutional: She is oriented to person, place, and time and well-developed, well-nourished, and in no distress.  HENT:  Head: Normocephalic.  Mouth/Throat: Oropharynx is clear and moist.  Eyes: Conjunctivae and EOM are normal. No scleral icterus.  Neck: Normal range of motion. Neck supple. No JVD present. No tracheal deviation present.  Cardiovascular: Normal rate, regular rhythm and normal heart sounds.  Exam reveals no gallop.   No murmur heard. Pulmonary/Chest: Effort normal. No respiratory distress. She has wheezes. She has no rales.  Abdominal: Soft. Bowel sounds are normal. She  exhibits no distension. There is no tenderness.  Musculoskeletal: Normal range of motion. She exhibits no edema or tenderness.  Neurological: She is alert and oriented to person, place, and time. No cranial nerve deficit.  Skin: No rash noted. No erythema.  Psychiatric: Affect and judgment normal.   LABORATORY PANEL:   CBC  Recent Labs Lab 04/23/16 1603  WBC 3.9  HGB 14.2  HCT 43.8  PLT 172   ------------------------------------------------------------------------------------------------------------------ Chemistries   Recent Labs Lab 04/23/16 1603  NA 141  K 4.2  CL 106  CO2 31  GLUCOSE 99  BUN 17  CREATININE 0.70  CALCIUM 9.2   RADIOLOGY:  Dg Chest 2 View  Result Date: 04/23/2016 CLINICAL DATA:  Shortness of breath, cough, fever EXAM: CHEST  2 VIEW COMPARISON:  02/07/2016 FINDINGS: There is hyperinflation of the lungs compatible with COPD. Heart and mediastinal contours are within normal limits. No focal opacities or effusions. No acute bony abnormality. IMPRESSION: COPD.  No active disease. Electronically Signed   By: Rolm Baptise M.D.   On: 04/23/2016 16:49   ASSESSMENT AND PLAN:   55 year old female history of COPD chronic respiratory failure presenting with shortness of breath  1.Chronic obstructive pulmonary disease exacerbation:  continue DuoNeb treatments q. 4 hours, Solu-Medrol and home medications.   * Chronic respiratory failure. Continue home O2 [place] 2L. NEB.  2. Hyperlipidemia: Statin therapy 3. Essential hypertension Coreg, enalapril  Tobacco abuse. Smoking cessation was counseled for 3 min, patient wants to quit.  All the records are reviewed and case discussed with Care Management/Social Worker. Management plans discussed with the patient, family and they are in agreement.  CODE STATUS: full code.  TOTAL TIME TAKING CARE  OF THIS PATIENT: 37 minutes.   More than 50% of the time was spent in counseling/coordination of care: YES  POSSIBLE  D/C IN 2 DAYS, DEPENDING ON CLINICAL CONDITION.   Demetrios Loll M.D on 04/24/2016 at 1:09 PM  Between 7am to 6pm - Pager - [phone]  After 6pm go to [email or web address] - [employer] [facility]  Office  [phone]  CC: Primary care physician; No primary care provider on file.  Note: This dictation was prepared with Dragon dictation along with smaller phrase technology. Any transcriptional errors that result from this process are unintentional.

## 2016-04-25 MED ORDER — METHYLPREDNISOLONE SODIUM SUCC 125 MG IJ SOLR
60.0000 mg | Freq: Every day | INTRAMUSCULAR | Status: DC
  Administered 2016-04-25 – 2016-04-27 (×3): 60 mg via INTRAVENOUS
  Filled 2016-04-25 (×3): qty 2

## 2016-04-25 MED ORDER — ENOXAPARIN SODIUM 40 MG/0.4ML ~~LOC~~ SOLN
40.0000 mg | Freq: Every day | SUBCUTANEOUS | Status: DC
  Administered 2016-04-25 – 2016-04-26 (×2): 40 mg via SUBCUTANEOUS
  Filled 2016-04-25 (×2): qty 0.4

## 2016-04-25 NOTE — Plan of Care (Signed)
Problem: Pain Managment: Goal: General experience of comfort will improve Outcome: Progressing C/O of HA during assessment.

## 2016-04-25 NOTE — Progress Notes (Signed)
[facility] [place] NAME: Michelle [name]    MR#:  [id number]  DATE OF BIRTH:  27-Aug-1960  SUBJECTIVE:  CHIEF COMPLAINT:   Chief Complaint  Patient presents with  . Shortness of Breath   A little better SOB, wheezing and cough. REVIEW OF SYSTEMS:  Review of Systems  Constitutional: Positive for malaise/fatigue. Negative for chills and fever.  HENT: Negative for congestion.   Eyes: Negative for blurred vision.  Respiratory: Positive for cough, shortness of breath and wheezing. Negative for hemoptysis, sputum production and stridor.   Cardiovascular: Negative for chest pain and leg swelling.  Gastrointestinal: Negative for abdominal pain, blood in stool, diarrhea, melena, nausea and vomiting.  Genitourinary: Negative for dysuria and hematuria.  Musculoskeletal: Negative for joint pain.  Skin: Negative for rash.  Neurological: Negative for dizziness, focal weakness and loss of consciousness.  Psychiatric/Behavioral: Negative for depression. The patient is not nervous/anxious.     DRUG ALLERGIES:  No Known Allergies VITALS:  Blood pressure 110/61, pulse 83, temperature 97.8 F (36.6 C), temperature source Oral, resp. rate 16, height 4\' 9"  (1.448 m), weight 138 lb (62.6 kg), last menstrual period 03/06/2005, SpO2 100 %. PHYSICAL EXAMINATION:  Physical Exam  Constitutional: She is oriented to person, place, and time and well-developed, well-nourished, and in no distress.  HENT:  Head: Normocephalic.  Mouth/Throat: Oropharynx is clear and moist.  Eyes: Conjunctivae and EOM are normal. No scleral icterus.  Neck: Normal range of motion. Neck supple. No JVD present. No tracheal deviation present.  Cardiovascular: Normal rate, regular rhythm and normal heart sounds.  Exam reveals no gallop.   No murmur heard. Pulmonary/Chest: Effort normal. No respiratory distress. She has wheezes. She has no rales.  Abdominal: Soft. Bowel sounds are normal.  She exhibits no distension. There is no tenderness.  Musculoskeletal: Normal range of motion. She exhibits no edema or tenderness.  Neurological: She is alert and oriented to person, place, and time. No cranial nerve deficit.  Skin: No rash noted. No erythema.  Psychiatric: Affect and judgment normal.   LABORATORY PANEL:   CBC  Recent Labs Lab 04/23/16 1603  WBC 3.9  HGB 14.2  HCT 43.8  PLT 172   ------------------------------------------------------------------------------------------------------------------ Chemistries   Recent Labs Lab 04/23/16 1603  NA 141  K 4.2  CL 106  CO2 31  GLUCOSE 99  BUN 17  CREATININE 0.70  CALCIUM 9.2   RADIOLOGY:  No results found. ASSESSMENT AND PLAN:   55 year old female history of COPD chronic respiratory failure presenting with shortness of breath  1.Chronic obstructive pulmonary disease exacerbation:  continue DuoNeb treatments q. 4 hours, Solu-Medrol and home medications. Robitussin prn.  * Chronic respiratory failure. Continue home O2 [place] 2L. NEB.  2. Hyperlipidemia: Statin therapy 3. Essential hypertension Coreg, enalapril  Tobacco abuse. Smoking cessation was counseled for 3 min, patient wants to quit.  All the records are reviewed and case discussed with Care Management/Social Worker. Management plans discussed with the patient, family and they are in agreement.  CODE STATUS: full code.  TOTAL TIME TAKING CARE OF THIS PATIENT: 32 minutes.   More than 50% of the time was spent in counseling/coordination of care: YES  POSSIBLE D/C IN 1-2 DAYS, DEPENDING ON CLINICAL CONDITION.   Demetrios Loll M.D on 04/25/2016 at 2:00 PM  Between 7am to 6pm - Pager - [phone]  After 6pm go to [email or web address] - Proofreader  [employer]  [phone]  CC: Primary care physician; No primary care provider on file.  Note: This dictation was prepared with Dragon dictation along with  smaller phrase technology. Any transcriptional errors that result from this process are unintentional.

## 2016-04-26 LAB — CBC
HCT: 36.5 % (ref 35.0–47.0)
Hemoglobin: 12.1 g/dL (ref 12.0–16.0)
MCH: 29.5 pg (ref 26.0–34.0)
MCHC: 33 g/dL (ref 32.0–36.0)
MCV: 89.3 fL (ref 80.0–100.0)
PLATELETS: 177 10*3/uL (ref 150–440)
RBC: 4.09 MIL/uL (ref 3.80–5.20)
RDW: 14.4 % (ref 11.5–14.5)
WBC: 12.6 10*3/uL — ABNORMAL HIGH (ref 3.6–11.0)

## 2016-04-26 LAB — CREATININE, SERUM
CREATININE: 0.53 mg/dL (ref 0.44–1.00)
GFR calc Af Amer: >60 mL/min (ref >60)
GFR calc non Af Amer: >60 mL/min (ref >60)

## 2016-04-26 MED ORDER — LORAZEPAM 2 MG/ML IJ SOLN: 1.0000 mg | Freq: Once | INTRAMUSCULAR | Status: DC

## 2016-04-26 MED ORDER — HYDROCOD POLST-CPM POLST ER 10-8 MG/5ML PO SUER
5.0000 mL | Freq: Two times a day (BID) | ORAL | Status: DC
  Administered 2016-04-26 – 2016-04-27 (×3): 5 mL via ORAL
  Filled 2016-04-26 (×3): qty 5

## 2016-04-26 MED ORDER — LEVOFLOXACIN 500 MG PO TABS
500.0000 mg | ORAL_TABLET | Freq: Every day | ORAL | Status: DC
  Administered 2016-04-26 – 2016-04-27 (×2): 500 mg via ORAL
  Filled 2016-04-26 (×2): qty 1

## 2016-04-26 NOTE — Progress Notes (Signed)
[facility] at [place] NAME: Michelle [name]    MR#:  [id number]  DATE OF BIRTH:  1961/04/29  SUBJECTIVE:  CHIEF COMPLAINT:   Chief Complaint  Patient presents with  . Shortness of Breath   - feels bad today, cant breathe- though sats good on 2l- her chronic home o2 - coughing greenish productive phlegm  REVIEW OF SYSTEMS:  Review of Systems  Constitutional: Negative for chills, fever and malaise/fatigue.  HENT: Negative for congestion, ear discharge, hearing loss and nosebleeds.   Eyes: Negative for blurred vision and double vision.  Respiratory: Positive for cough, sputum production and shortness of breath. Negative for wheezing.   Cardiovascular: Negative for chest pain, palpitations and leg swelling.  Gastrointestinal: Negative for abdominal pain, constipation, diarrhea, nausea and vomiting.  Genitourinary: Negative for dysuria.  Neurological: Negative for dizziness, sensory change, speech change, focal weakness, seizures and headaches.  Psychiatric/Behavioral: Negative for depression. The patient is nervous/anxious.     DRUG ALLERGIES:  No Known Allergies  VITALS:  Blood pressure 122/64, pulse 86, temperature 98.1 F (36.7 C), temperature source Oral, resp. rate 16, height 4\' 9"  (1.448 m), weight 62.6 kg (138 lb), last menstrual period 03/06/2005, SpO2 98 %.  PHYSICAL EXAMINATION:  Physical Exam  GENERAL:  55 y.o.-year-old thin patient lying in the bed with no acute distress.  EYES: Pupils equal, round, reactive to light and accommodation. No scleral icterus. Extraocular muscles intact.  HEENT: Head atraumatic, normocephalic. Oropharynx and nasopharynx clear.  NECK:  Supple, no jugular venous distention. No thyroid enlargement, no tenderness.  LUNGS: Scant breath sounds but diffuse expiratory wheeze worse posteriorly on the left side, no rales,rhonchi or crepitation. No use of accessory muscles of respiration.  CARDIOVASCULAR:  S1, S2 normal. No murmurs, rubs, or gallops.  ABDOMEN: Soft, nontender, nondistended. Bowel sounds present. No organomegaly or mass.  EXTREMITIES: No pedal edema, cyanosis, or clubbing.  NEUROLOGIC: Cranial nerves II through XII are intact. Muscle strength 5/5 in all extremities. Sensation intact. Gait not checked.  PSYCHIATRIC: The patient is alert and oriented x 3.  SKIN: No obvious rash, lesion, or ulcer.    LABORATORY PANEL:   CBC  Recent Labs Lab 04/26/16 0414  WBC 12.6*  HGB 12.1  HCT 36.5  PLT 177   ------------------------------------------------------------------------------------------------------------------  Chemistries   Recent Labs Lab 04/23/16 1603 04/26/16 0414  NA 141  --   K 4.2  --   CL 106  --   CO2 31  --   GLUCOSE 99  --   BUN 17  --   CREATININE 0.70 0.53  CALCIUM 9.2  --    ------------------------------------------------------------------------------------------------------------------  Cardiac Enzymes  Recent Labs Lab 04/23/16 1603  TROPONINI <0.03   ------------------------------------------------------------------------------------------------------------------  RADIOLOGY:  No results found.  EKG:   Orders placed or performed during the hospital encounter of 04/23/16  . ED EKG within 10 minutes  . ED EKG within 10 minutes  . EKG 12-Lead  . EKG 12-Lead    ASSESSMENT AND PLAN:   Michelle [name] a 55 y.o. femalewith a known history of Chronic respiratory failure secondary to stage IV COPD on 2 L home oxygen, diastolic CHF, CAD, hypertension, ongoing smoking presents to the hospital secondary to worsening shortness of breath.  #1 Acute on chronic COPD exacerbation- on 2l o2 now- which is chronic - on solumedrol IV, improving slowly - added levaquin today as complaining of greenish productive sputum. CXR on admission- clear - on nebs, inhalers -Last  hospitalization in September 2017. Follows with pulmonologist at [facility].  Finished pulmonary rehabilitation in the past. - Last ECHO with mild pulmonary hypertension and diastolic dysfunction, EF [id number] from June 2017  #2 HTN- on coreg and enalapril  #3 anxiety-continue Ativan prn  #4 tobacco use disorder- working on quitting slowly.  #5 Hyperlipidemia- statin  #6 DVT Prophylaxis- lovenox    Anticipate discharge tomorrow   All the records are reviewed and case discussed with Care Management/Social Workerr. Management plans discussed with the patient, family and they are in agreement.  CODE STATUS: Full Code  TOTAL TIME TAKING CARE OF THIS PATIENT: 37 minutes.   POSSIBLE D/C TOMORROW, DEPENDING ON CLINICAL CONDITION.   Gladstone Lighter M.D on 04/26/2016 at 9:20 AM  Between 7am to 6pm - Pager - [phone]  After 6pm go to [email or web address] - password [email or web address] [facility]  Office  [phone]  CC: Primary care physician; No primary care provider on file.

## 2016-04-27 LAB — BASIC METABOLIC PANEL
Anion gap: 4 — ABNORMAL LOW (ref 5–15)
BUN: 29 mg/dL — AB (ref 6–20)
CALCIUM: 9.3 mg/dL (ref 8.9–10.3)
CO2: 34 mmol/L — ABNORMAL HIGH (ref 22–32)
CREATININE: 0.74 mg/dL (ref 0.44–1.00)
Chloride: 105 mmol/L (ref 101–111)
GFR calc Af Amer: >60 mL/min (ref >60)
GLUCOSE: 84 mg/dL (ref 65–99)
POTASSIUM: 4.1 mmol/L (ref 3.5–5.1)
SODIUM: 143 mmol/L (ref 135–145)

## 2016-04-27 MED ORDER — OXYCODONE HCL 5 MG PO TABS: 5.0000 mg | ORAL_TABLET | ORAL | 0 refills | Status: DC | PRN

## 2016-04-27 MED ORDER — PREDNISONE 10 MG (21) PO TBPK: 10.0000 mg | ORAL_TABLET | Freq: Every day | ORAL | 0 refills | Status: DC

## 2016-04-27 MED ORDER — HYDROCOD POLST-CPM POLST ER 10-8 MG/5ML PO SUER: 5.0000 mL | Freq: Two times a day (BID) | ORAL | 0 refills | Status: DC

## 2016-04-27 MED ORDER — LORAZEPAM 1 MG PO TABS: 1.0000 mg | ORAL_TABLET | Freq: Two times a day (BID) | ORAL | 0 refills | Status: DC | PRN

## 2016-04-27 MED ORDER — LEVOFLOXACIN 500 MG PO TABS: 500.0000 mg | ORAL_TABLET | Freq: Every day | ORAL | 0 refills | Status: DC

## 2016-04-27 MED ORDER — IPRATROPIUM-ALBUTEROL 0.5-2.5 (3) MG/3ML IN SOLN: 3.0000 mL | Freq: Four times a day (QID) | RESPIRATORY_TRACT | 0 refills | Status: DC | PRN

## 2016-04-27 NOTE — Progress Notes (Signed)
Pt is being discharged home. Pt received nebulizer machine. Discharge papers given and explained to pt. Pt verbalized understanding. Meds and f/u appointment reviewed with pt. RX given.

## 2016-04-27 NOTE — Care Management (Signed)
Discharge to home today per Dr. Tressia Miners. Will need home nebulizer. Leroy Sea, [place] representative updated. Family will transport. Shelbie Ammons RN MSN CCM Care Management

## 2016-04-28 NOTE — Discharge Summary (Signed)
[facility] at [place] NAME: Michelle [name]    MR#:  [id number]  DATE OF BIRTH:  1960-06-30  DATE OF ADMISSION:  04/23/2016   ADMITTING PHYSICIAN: Lytle Butte, MD  DATE OF DISCHARGE: 04/27/2016  3:43 PM  PRIMARY CARE PHYSICIAN: No primary care provider on file.   ADMISSION DIAGNOSIS:   COPD exacerbation ([place]) [J44.1]  DISCHARGE DIAGNOSIS:   Active Problems:   COPD exacerbation ([place])   SECONDARY DIAGNOSIS:   Past Medical History:  Diagnosis Date  . Allergy   . Anxiety   . Asthma   . CHF (congestive heart failure) ([place])   . Cocaine abuse   . COPD (chronic obstructive pulmonary disease) ([place])   . Coronary artery disease   . Emphysema/COPD ([place])   . Hepatitis C   . High cholesterol   . Hypertension   . Pneumothorax   . Tobacco abuse     HOSPITAL COURSE:   Michelle [name] a 55 y.o. femalewith a known history of Chronic respiratory failure secondary to stage IV COPD on 2 L home oxygen, diastolic CHF, CAD, hypertension, ongoing smoking presents to the hospital secondary to worsening shortness of breath.  #1 Acute on chronic COPD exacerbation- on 2l o2 now- which is chronic - received solumedrol IV, with good improvement- changed to prednisone taper - added levaquin as complaining of greenish productive sputum for bronchitis. CXR on admission- clear - on nebs, inhalers -Last hospitalization in September 2017. Follows with pulmonologist at [facility]. Finished pulmonary rehabilitation in the past. - Last ECHO with mild pulmonary hypertension and diastolic dysfunction, EF [id number] from June 2017  #2 HTN- on coreg and enalapril  #3 anxiety-continue Ativan prn  #4 tobacco use disorder- working on quitting slowly.  #5 Hyperlipidemia- statin  Discharge home.  DISCHARGE CONDITIONS:   Guarded  CONSULTS OBTAINED:   Treatment Team:  Lytle Butte, MD  DRUG ALLERGIES:   No Known Allergies DISCHARGE MEDICATIONS:       Medication List    STOP taking these medications   simvastatin 20 MG tablet Commonly known as:  ZOCOR     TAKE these medications   albuterol 108 (90 Base) MCG/ACT inhaler Commonly known as:  PROVENTIL HFA;VENTOLIN HFA Inhale 2 puffs into the lungs every 6 (six) hours as needed for wheezing or shortness of breath.   aspirin EC 81 MG tablet Take 81 mg by mouth daily.   carvedilol 3.125 MG tablet Commonly known as:  COREG Take 1 tablet (3.125 mg total) by mouth every morning.   chlorpheniramine-HYDROcodone 10-8 MG/5ML Suer Commonly known as:  TUSSIONEX Take 5 mLs by mouth every 12 (twelve) hours. X 6 days only   enalapril 10 MG tablet Commonly known as:  VASOTEC Take 0.5 tablets (5 mg total) by mouth daily.   feeding supplement (ENSURE ENLIVE) Liqd Take 237 mLs by mouth 2 (two) times daily between meals.   Fluticasone-Salmeterol 250-50 MCG/DOSE Aepb Commonly known as:  ADVAIR Inhale 1 puff into the lungs 2 (two) times daily.   ipratropium-albuterol 0.5-2.5 (3) MG/3ML Soln Commonly known as:  DUONEB Take 3 mLs by nebulization every 6 (six) hours as needed. What changed:  when to take this   levofloxacin 500 MG tablet Commonly known as:  LEVAQUIN Take 1 tablet (500 mg total) by mouth daily. X 6 days   LORazepam 1 MG tablet Commonly known as:  ATIVAN Take 1 tablet (1 mg total) by mouth 2 (two) times daily as needed for  anxiety.   oxyCODONE 5 MG immediate release tablet Commonly known as:  Oxy IR/ROXICODONE Take 1 tablet (5 mg total) by mouth every 4 (four) hours as needed for moderate pain.   predniSONE 10 MG (21) Tbpk tablet Commonly known as:  STERAPRED UNI-PAK 21 TAB Take 1 tablet (10 mg total) by mouth daily. 6 tabs PO x 1 day 5 tabs PO x 1 day 4 tabs PO x 1 day 3 tabs PO x 1 day 2 tabs PO x 1 day 1 tab PO x 1 day and stop   tiotropium 18 MCG inhalation capsule Commonly known as:  SPIRIVA Place 1 capsule (18 mcg total) into inhaler and inhale daily.         DISCHARGE INSTRUCTIONS:   1. PCP f/u in 1-2 weeks 2. Medication compliance advised 3. Follow up with your pulmonologist in 2 weeks  DIET:   Cardiac diet  ACTIVITY:   Activity as tolerated  OXYGEN:   Home Oxygen: Yes.    Oxygen Delivery: 2 liters/min via Patient connected to nasal cannula oxygen  DISCHARGE LOCATION:   home   If you experience worsening of your admission symptoms, develop shortness of breath, life threatening emergency, suicidal or homicidal thoughts you must seek medical attention immediately by calling 911 or calling your MD immediately  if symptoms less severe.  You Must read complete instructions/literature along with all the possible adverse reactions/side effects for all the Medicines you take and that have been prescribed to you. Take any new Medicines after you have completely understood and accpet all the possible adverse reactions/side effects.   Please note  You were cared for by a hospitalist during your hospital stay. If you have any questions about your discharge medications or the care you received while you were in the hospital after you are discharged, you can call the unit and asked to speak with the hospitalist on call if the hospitalist that took care of you is not available. Once you are discharged, your primary care physician will handle any further medical issues. Please note that NO REFILLS for any discharge medications will be authorized once you are discharged, as it is imperative that you return to your primary care physician (or establish a relationship with a primary care physician if you do not have one) for your aftercare needs so that they can reassess your need for medications and monitor your lab values.    On the day of Discharge:  VITAL SIGNS:   Blood pressure 129/74, pulse 81, temperature 98.7 F (37.1 C), resp. rate 18, height 4\' 9"  (1.448 m), weight 62.6 kg (138 lb), last menstrual period 03/06/2005, SpO2 100  %.  PHYSICAL EXAMINATION:   GENERAL:  55 y.o.-year-old thin patient lying in the bed with no acute distress.  EYES: Pupils equal, round, reactive to light and accommodation. No scleral icterus. Extraocular muscles intact.  HEENT: Head atraumatic, normocephalic. Oropharynx and nasopharynx clear.  NECK:  Supple, no jugular venous distention. No thyroid enlargement, no tenderness.  LUNGS: Scant breath sounds but diffuse expiratory wheeze worse posteriorly on the left side, no rales,rhonchi or crepitation. No use of accessory muscles of respiration.  CARDIOVASCULAR: S1, S2 normal. No murmurs, rubs, or gallops.  ABDOMEN: Soft, nontender, nondistended. Bowel sounds present. No organomegaly or mass.  EXTREMITIES: No pedal edema, cyanosis, or clubbing.  NEUROLOGIC: Cranial nerves II through XII are intact. Muscle strength 5/5 in all extremities. Sensation intact. Gait not checked.  PSYCHIATRIC: The patient is alert and oriented x 3.  SKIN: No obvious rash, lesion, or ulcer.   DATA REVIEW:   CBC  Recent Labs Lab 04/26/16 0414  WBC 12.6*  HGB 12.1  HCT 36.5  PLT 177    Chemistries   Recent Labs Lab 04/27/16 0412  NA 143  K 4.1  CL 105  CO2 34*  GLUCOSE 84  BUN 29*  CREATININE 0.74  CALCIUM 9.3     Microbiology Results  Results for orders placed or performed during the hospital encounter of 04/23/16  MRSA PCR Screening     Status: None   Collection Time: 04/23/16 11:04 PM  Result Value Ref Range Status   MRSA by PCR NEGATIVE NEGATIVE Final    Comment:        The GeneXpert MRSA Assay (FDA approved for NASAL specimens only), is one component of a comprehensive MRSA colonization surveillance program. It is not intended to diagnose MRSA infection nor to guide or monitor treatment for MRSA infections.     RADIOLOGY:  No results found.   Management plans discussed with the patient, family and they are in agreement.  CODE STATUS:  Code Status History    Date  Active Date Inactive Code Status Order ID Comments User Context   04/23/2016  8:20 PM 04/27/2016  6:48 PM Full Code [id number]  Lytle Butte, MD ED   02/07/2016  7:22 PM 02/10/2016  6:35 PM Full Code [id number]  Gladstone Lighter, MD Inpatient   11/29/2015  5:19 AM 12/01/2015  4:09 PM Full Code [id number]  Harrie Foreman, MD Inpatient   11/05/2015  9:11 AM 11/07/2015  4:06 PM Full Code [id number]  Hillary Bow, MD ED   10/24/2014  3:22 PM 10/27/2014  4:30 PM Full Code [id number]  Hillary Bow, MD Inpatient      TOTAL TIME TAKING CARE OF THIS PATIENT: 38 minutes.    Gladstone Lighter M.D on 04/28/2016 at 12:14 PM  Between 7am to 6pm - Pager - [phone]  After 6pm go to [email or web address] - [employer] [facility]  Office  [phone]  CC: Primary care physician; No primary care provider on file.   Note: This dictation was prepared with Dragon dictation along with smaller phrase technology. Any transcriptional errors that result from this process are unintentional.
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
## 2016-05-22 ENCOUNTER — Encounter: Payer: Self-pay | Admitting: Emergency Medicine

## 2016-05-22 ENCOUNTER — Emergency Department: Payer: Medicaid Other

## 2016-05-22 ENCOUNTER — Inpatient Hospital Stay
Admission: EM | Admit: 2016-05-22 | Discharge: 2016-05-27 | DRG: 871 | Disposition: A | Discharge status: Home / Self Care | Payer: Medicaid Other | Attending: Internal Medicine | Admitting: Internal Medicine

## 2016-05-22 DIAGNOSIS — Y95 Nosocomial condition: Secondary | ICD-10-CM | POA: Diagnosis present

## 2016-05-22 DIAGNOSIS — J189 Pneumonia, unspecified organism: Secondary | ICD-10-CM | POA: Diagnosis not present

## 2016-05-22 DIAGNOSIS — J441 Chronic obstructive pulmonary disease with (acute) exacerbation: Secondary | ICD-10-CM | POA: Diagnosis present

## 2016-05-22 DIAGNOSIS — Z825 Family history of asthma and other chronic lower respiratory diseases: Secondary | ICD-10-CM | POA: Diagnosis not present

## 2016-05-22 DIAGNOSIS — Z8249 Family history of ischemic heart disease and other diseases of the circulatory system: Secondary | ICD-10-CM | POA: Diagnosis not present

## 2016-05-22 DIAGNOSIS — M6281 Muscle weakness (generalized): Secondary | ICD-10-CM

## 2016-05-22 DIAGNOSIS — J9621 Acute and chronic respiratory failure with hypoxia: Secondary | ICD-10-CM | POA: Diagnosis present

## 2016-05-22 DIAGNOSIS — R262 Difficulty in walking, not elsewhere classified: Secondary | ICD-10-CM

## 2016-05-22 DIAGNOSIS — Z9981 Dependence on supplemental oxygen: Secondary | ICD-10-CM

## 2016-05-22 DIAGNOSIS — F17210 Nicotine dependence, cigarettes, uncomplicated: Secondary | ICD-10-CM | POA: Diagnosis present

## 2016-05-22 DIAGNOSIS — Z681 Body mass index (BMI) 19 or less, adult: Secondary | ICD-10-CM

## 2016-05-22 DIAGNOSIS — Z7982 Long term (current) use of aspirin: Secondary | ICD-10-CM | POA: Diagnosis not present

## 2016-05-22 DIAGNOSIS — J440 Chronic obstructive pulmonary disease with acute lower respiratory infection: Secondary | ICD-10-CM | POA: Diagnosis present

## 2016-05-22 DIAGNOSIS — Z79899 Other long term (current) drug therapy: Secondary | ICD-10-CM

## 2016-05-22 DIAGNOSIS — A419 Sepsis, unspecified organism: Secondary | ICD-10-CM | POA: Diagnosis not present

## 2016-05-22 DIAGNOSIS — F419 Anxiety disorder, unspecified: Secondary | ICD-10-CM | POA: Diagnosis present

## 2016-05-22 DIAGNOSIS — E43 Unspecified severe protein-calorie malnutrition: Secondary | ICD-10-CM | POA: Diagnosis present

## 2016-05-22 DIAGNOSIS — R52 Pain, unspecified: Secondary | ICD-10-CM

## 2016-05-22 LAB — BASIC METABOLIC PANEL
ANION GAP: 6 (ref 5–15)
BUN: 12 mg/dL (ref 6–20)
CHLORIDE: 103 mmol/L (ref 101–111)
CO2: 28 mmol/L (ref 22–32)
Calcium: 8.8 mg/dL — ABNORMAL LOW (ref 8.9–10.3)
Creatinine, Ser: 0.63 mg/dL (ref 0.44–1.00)
GFR calc non Af Amer: >60 mL/min (ref >60)
GLUCOSE: 115 mg/dL — AB (ref 65–99)
Potassium: 3.5 mmol/L (ref 3.5–5.1)
Sodium: 137 mmol/L (ref 135–145)

## 2016-05-22 LAB — CBC
HEMATOCRIT: 39.9 % (ref 35.0–47.0)
HEMOGLOBIN: 13 g/dL (ref 12.0–16.0)
MCH: 29 pg (ref 26.0–34.0)
MCHC: 32.5 g/dL (ref 32.0–36.0)
MCV: 89 fL (ref 80.0–100.0)
Platelets: 248 10*3/uL (ref 150–440)
RBC: 4.48 MIL/uL (ref 3.80–5.20)
RDW: 14.1 % (ref 11.5–14.5)
WBC: 19.2 10*3/uL — ABNORMAL HIGH (ref 3.6–11.0)

## 2016-05-22 LAB — LACTIC ACID, PLASMA: LACTIC ACID, VENOUS: 1 mmol/L (ref 0.5–1.9)

## 2016-05-22 LAB — TROPONIN I: TROPONIN I: 0.04 ng/mL — AB (ref <0.03)

## 2016-05-22 MED ORDER — PREDNISONE 20 MG PO TABS
20.0000 mg | ORAL_TABLET | Freq: Every day | ORAL | Status: AC
  Administered 2016-05-26: 20 mg via ORAL
  Filled 2016-05-22: qty 1

## 2016-05-22 MED ORDER — PREDNISONE 20 MG PO TABS
40.0000 mg | ORAL_TABLET | Freq: Once | ORAL | Status: AC
  Administered 2016-05-22: 40 mg via ORAL
  Filled 2016-05-22: qty 2

## 2016-05-22 MED ORDER — IPRATROPIUM-ALBUTEROL 0.5-2.5 (3) MG/3ML IN SOLN
3.0000 mL | Freq: Four times a day (QID) | RESPIRATORY_TRACT | Status: DC | PRN
  Administered 2016-05-23 – 2016-05-25 (×8): 3 mL via RESPIRATORY_TRACT
  Filled 2016-05-22 (×9): qty 3

## 2016-05-22 MED ORDER — ENALAPRIL MALEATE 10 MG PO TABS
5.0000 mg | ORAL_TABLET | Freq: Every day | ORAL | Status: DC
  Administered 2016-05-23 – 2016-05-27 (×5): 5 mg via ORAL
  Filled 2016-05-22 (×5): qty 1

## 2016-05-22 MED ORDER — TIOTROPIUM BROMIDE MONOHYDRATE 18 MCG IN CAPS
18.0000 ug | ORAL_CAPSULE | Freq: Every day | RESPIRATORY_TRACT | Status: DC
  Administered 2016-05-23 – 2016-05-27 (×5): 18 ug via RESPIRATORY_TRACT
  Filled 2016-05-22: qty 5

## 2016-05-22 MED ORDER — ORAL CARE MOUTH RINSE
15.0000 mL | Freq: Two times a day (BID) | OROMUCOSAL | Status: DC
  Administered 2016-05-23 – 2016-05-26 (×8): 15 mL via OROMUCOSAL

## 2016-05-22 MED ORDER — PNEUMOCOCCAL VAC POLYVALENT 25 MCG/0.5ML IJ INJ
0.5000 mL | INJECTION | INTRAMUSCULAR | Status: DC
  Filled 2016-05-22: qty 0.5

## 2016-05-22 MED ORDER — ALBUTEROL SULFATE (2.5 MG/3ML) 0.083% IN NEBU
3.0000 mL | INHALATION_SOLUTION | Freq: Four times a day (QID) | RESPIRATORY_TRACT | Status: DC | PRN
  Administered 2016-05-23 – 2016-05-26 (×5): 3 mL via RESPIRATORY_TRACT
  Filled 2016-05-22 (×5): qty 3

## 2016-05-22 MED ORDER — ALBUTEROL SULFATE (2.5 MG/3ML) 0.083% IN NEBU
2.5000 mg | INHALATION_SOLUTION | RESPIRATORY_TRACT | Status: AC
  Administered 2016-05-22: 2.5 mg via RESPIRATORY_TRACT
  Filled 2016-05-22: qty 3

## 2016-05-22 MED ORDER — MOMETASONE FURO-FORMOTEROL FUM 200-5 MCG/ACT IN AERO
2.0000 | INHALATION_SPRAY | Freq: Two times a day (BID) | RESPIRATORY_TRACT | Status: DC
  Administered 2016-05-22 – 2016-05-27 (×10): 2 via RESPIRATORY_TRACT
  Filled 2016-05-22 (×2): qty 8.8

## 2016-05-22 MED ORDER — VANCOMYCIN HCL IN DEXTROSE 1-5 GM/200ML-% IV SOLN
1000.0000 mg | Freq: Once | INTRAVENOUS | Status: AC
Start: 2016-05-22 — End: 2016-05-22
  Administered 2016-05-22: 1000 mg via INTRAVENOUS
  Filled 2016-05-22: qty 200

## 2016-05-22 MED ORDER — ONDANSETRON HCL 4 MG PO TABS: 4.0000 mg | ORAL_TABLET | Freq: Four times a day (QID) | ORAL | Status: DC | PRN

## 2016-05-22 MED ORDER — PREDNISONE 20 MG PO TABS: 50.0000 mg | ORAL_TABLET | Freq: Every day | ORAL | Status: DC

## 2016-05-22 MED ORDER — CEFEPIME-DEXTROSE 2 GM/50ML IV SOLR
2.0000 g | Freq: Once | INTRAVENOUS | Status: AC
  Administered 2016-05-22: 2 g via INTRAVENOUS
  Filled 2016-05-22: qty 50

## 2016-05-22 MED ORDER — ENSURE ENLIVE PO LIQD
237.0000 mL | Freq: Two times a day (BID) | ORAL | Status: DC
  Administered 2016-05-23 – 2016-05-27 (×7): 237 mL via ORAL

## 2016-05-22 MED ORDER — CARVEDILOL 6.25 MG PO TABS
3.1250 mg | ORAL_TABLET | ORAL | Status: DC
  Administered 2016-05-23 – 2016-05-27 (×5): 3.125 mg via ORAL
  Filled 2016-05-22: qty 2
  Filled 2016-05-22 (×4): qty 1

## 2016-05-22 MED ORDER — LEVOFLOXACIN 500 MG PO TABS
500.0000 mg | ORAL_TABLET | Freq: Every day | ORAL | Status: DC
  Administered 2016-05-22 – 2016-05-26 (×5): 500 mg via ORAL
  Filled 2016-05-22 (×5): qty 1

## 2016-05-22 MED ORDER — IPRATROPIUM-ALBUTEROL 0.5-2.5 (3) MG/3ML IN SOLN
3.0000 mL | Freq: Once | RESPIRATORY_TRACT | Status: AC
  Administered 2016-05-22: 3 mL via RESPIRATORY_TRACT
  Filled 2016-05-22: qty 3

## 2016-05-22 MED ORDER — AZITHROMYCIN 500 MG PO TABS
500.0000 mg | ORAL_TABLET | Freq: Once | ORAL | Status: DC
  Filled 2016-05-22: qty 1

## 2016-05-22 MED ORDER — TRAMADOL HCL 50 MG PO TABS
50.0000 mg | ORAL_TABLET | Freq: Four times a day (QID) | ORAL | Status: DC | PRN
  Administered 2016-05-23 – 2016-05-26 (×6): 50 mg via ORAL
  Filled 2016-05-22 (×6): qty 1

## 2016-05-22 MED ORDER — ENOXAPARIN SODIUM 30 MG/0.3ML ~~LOC~~ SOLN
30.0000 mg | Freq: Every day | SUBCUTANEOUS | Status: DC
  Administered 2016-05-22 – 2016-05-26 (×5): 30 mg via SUBCUTANEOUS
  Filled 2016-05-22 (×5): qty 0.3

## 2016-05-22 MED ORDER — ACETAMINOPHEN 325 MG PO TABS: 650.0000 mg | ORAL_TABLET | Freq: Four times a day (QID) | ORAL | Status: DC | PRN

## 2016-05-22 MED ORDER — PREDNISONE 20 MG PO TABS
40.0000 mg | ORAL_TABLET | Freq: Every day | ORAL | Status: AC
  Administered 2016-05-24: 40 mg via ORAL
  Filled 2016-05-22: qty 2

## 2016-05-22 MED ORDER — PREDNISONE 20 MG PO TABS
30.0000 mg | ORAL_TABLET | Freq: Every day | ORAL | Status: AC
  Administered 2016-05-25: 30 mg via ORAL
  Filled 2016-05-22: qty 1

## 2016-05-22 MED ORDER — VANCOMYCIN HCL 500 MG IV SOLR
500.0000 mg | INTRAVENOUS | Status: DC
  Filled 2016-05-22: qty 500

## 2016-05-22 MED ORDER — SODIUM CHLORIDE 0.9 % IV BOLUS (SEPSIS)
500.0000 mL | Freq: Once | INTRAVENOUS | Status: AC
  Administered 2016-05-22: 500 mL via INTRAVENOUS

## 2016-05-22 MED ORDER — PREDNISONE 10 MG PO TABS
10.0000 mg | ORAL_TABLET | Freq: Every day | ORAL | Status: AC
  Administered 2016-05-27: 10 mg via ORAL
  Filled 2016-05-22: qty 1

## 2016-05-22 MED ORDER — ACETAMINOPHEN 650 MG RE SUPP: 650.0000 mg | Freq: Four times a day (QID) | RECTAL | Status: DC | PRN

## 2016-05-22 MED ORDER — SODIUM CHLORIDE 0.9 % IV SOLN
INTRAVENOUS | Status: DC
  Administered 2016-05-22 – 2016-05-23 (×3): via INTRAVENOUS

## 2016-05-22 MED ORDER — OXYCODONE-ACETAMINOPHEN 5-325 MG PO TABS
1.0000 | ORAL_TABLET | ORAL | Status: AC
  Administered 2016-05-22: 1 via ORAL
  Filled 2016-05-22: qty 1

## 2016-05-22 MED ORDER — ASPIRIN EC 81 MG PO TBEC
81.0000 mg | DELAYED_RELEASE_TABLET | Freq: Every day | ORAL | Status: DC
  Administered 2016-05-23 – 2016-05-27 (×5): 81 mg via ORAL
  Filled 2016-05-22 (×5): qty 1

## 2016-05-22 MED ORDER — PREDNISONE 20 MG PO TABS
50.0000 mg | ORAL_TABLET | Freq: Every day | ORAL | Status: AC
  Administered 2016-05-23: 50 mg via ORAL
  Filled 2016-05-22: qty 2

## 2016-05-22 MED ORDER — ONDANSETRON HCL 4 MG/2ML IJ SOLN
4.0000 mg | Freq: Four times a day (QID) | INTRAMUSCULAR | Status: DC | PRN
  Administered 2016-05-26 – 2016-05-27 (×2): 4 mg via INTRAVENOUS
  Filled 2016-05-22 (×2): qty 2

## 2016-05-22 NOTE — Progress Notes (Signed)
Pharmacist - Prescriber Communication  Lovenox 40 mg subcutaneously daily has been changed to enoxaparin 30 mg subcutaneously daily for low body weight.  Koury Roddy A. Downey, [place].D., BCPS Clinical Pharmacist 05/22/2016 [id number]

## 2016-05-22 NOTE — ED Provider Notes (Signed)
[facility] Emergency Department Provider Note   ____________________________________________   First MD Initiated Contact with Patient 05/22/16 1820     (approximate)  I have reviewed the triage vital signs and the nursing notes.   HISTORY  Chief Complaint Shortness of Breath    HPI Michelle [name] is a 55 y.o. female reports she's been coughing almost every day since leaving the hospital about a month ago. She has COPD and is using inhalers to no effect. Last and a half she is also noticed that she started to cough up thick sputum, and has slowly developing pain over the right lower ribs. Patient reports she thinks she has pneumonia.  Denies any history of any blood clots. Denies any left-sided chest pain or radiating pain. Towards pain over the right lower chest wall when she coughs. No leg swelling.  Moderate discomfort. Mild shortness of breath.  Past Medical History:  Diagnosis Date  . Allergy   . Anxiety   . Asthma   . CHF (congestive heart failure) ([place])   . Cocaine abuse   . COPD (chronic obstructive pulmonary disease) ([place])   . Coronary artery disease   . Emphysema/COPD ([place])   . Hepatitis C   . High cholesterol   . Hypertension   . Pneumothorax   . Tobacco abuse     Patient Active Problem List   Diagnosis Date Noted  . Sepsis ([place]) 05/22/2016  . Malnutrition of moderate degree ([place]) 10/25/2014  . COPD exacerbation ([place]) 10/24/2014  . Tobacco abuse 10/24/2014    Past Surgical History:  Procedure Laterality Date  . CARDIAC CATHETERIZATION    . CHEST TUBE INSERTION      Prior to Admission medications   Medication Sig Start Date End Date Taking? Authorizing Provider  albuterol (PROVENTIL HFA;VENTOLIN HFA) 108 (90 Base) MCG/ACT inhaler Inhale 2 puffs into the lungs every 6 (six) hours as needed for wheezing or shortness of breath. 11/07/15  Yes Srikar Sudini, MD  aspirin EC 81 MG tablet Take 81 mg by mouth daily.   Yes Historical  Provider, MD  carvedilol (COREG) 3.125 MG tablet Take 1 tablet (3.125 mg total) by mouth every morning. 02/10/16  Yes Gladstone Lighter, MD  enalapril (VASOTEC) 10 MG tablet Take 0.5 tablets (5 mg total) by mouth daily. 02/10/16  Yes Gladstone Lighter, MD  Fluticasone-Salmeterol (ADVAIR) 250-50 MCG/DOSE AEPB Inhale 1 puff into the lungs 2 (two) times daily. 02/10/16  Yes Gladstone Lighter, MD  ipratropium-albuterol (DUONEB) 0.5-2.5 (3) MG/3ML SOLN Take 3 mLs by nebulization every 6 (six) hours as needed. 04/27/16  Yes Gladstone Lighter, MD  tiotropium (SPIRIVA) 18 MCG inhalation capsule Place 1 capsule (18 mcg total) into inhaler and inhale daily. 02/10/16  Yes Gladstone Lighter, MD  feeding supplement, ENSURE ENLIVE, (ENSURE ENLIVE) LIQD Take 237 mLs by mouth 2 (two) times daily between meals. 10/27/14   Nicholes Mango, MD  LORazepam (ATIVAN) 1 MG tablet Take 1 tablet (1 mg total) by mouth 2 (two) times daily as needed for anxiety. 04/27/16   Gladstone Lighter, MD    Allergies Patient has no known allergies.  Family History  Problem Relation Age of Onset  . Lung cancer Mother   . Asthma Mother   . CVA Father   . CAD Father   . Stroke Father   . Breast cancer Maternal Aunt 67  . COPD Sister     Social History Social History  Substance Use Topics  . Smoking status: Current Every Day  Smoker    Packs/day: 0.25    Years: 41.00    Types: Cigarettes  . Smokeless tobacco: Never Used  . Alcohol use No    Review of Systems Constitutional: No fever but has felt chills Eyes: No visual changes. ENT: No sore throat. Cardiovascular: Denies chest pain. Respiratory: See history of present illness. Wheezing off-and-on Gastrointestinal: No abdominal pain.  No nausea, no vomiting.  No diarrhea.  No constipation. Genitourinary: Negative for dysuria. Musculoskeletal: Negative for back pain. Skin: Negative for rash. Neurological: Negative for headaches, focal weakness or numbness.  10-point ROS  otherwise negative.  ____________________________________________   PHYSICAL EXAM:  VITAL SIGNS: ED Triage Vitals  Enc Vitals Group     BP 05/22/16 1750 118/89     Pulse Rate 05/22/16 1750 98     Resp 05/22/16 1750 (!) 28     Temp 05/22/16 1750 99 F (37.2 C)     Temp Source 05/22/16 1750 Oral     SpO2 05/22/16 1750 97 %     Weight 05/22/16 1750 80 lb (36.3 kg)     Height 05/22/16 1750 5' (1.524 m)     Head Circumference --      Peak Flow --      Pain Score 05/22/16 1751 8     Pain Loc --      Pain Edu? --      Excl. in [place]? --     Constitutional: Alert and oriented. Mildly ill-appearing, sitting upright with mild to moderate increased work of breathing. Eyes: Conjunctivae are normal. PERRL. EOMI. Head: Atraumatic. Nose: No congestion/rhinnorhea. Mouth/Throat: Mucous membranes are moist.  Oropharynx non-erythematous. Neck: No stridor.   Cardiovascular: Normal rate, regular rhythm. Grossly normal heart sounds.  Good peripheral circulation. Respiratory: Mild use of accessory muscles. Speech and 2-3 word phrases. Mild end expiratory wheezing throughout with focal rales noted in the right lower lung field. Gastrointestinal: Soft and nontender. No distention. No abdominal bruits. No CVA tenderness. Musculoskeletal: No lower extremity tenderness nor edema.  No joint effusions. Neurologic:  Normal speech and language. No gross focal neurologic deficits are appreciated.  Skin:  Skin is warm, dry and intact. No rash noted. Psychiatric: Mood and affect are normal. Speech and behavior are normal.  ____________________________________________   LABS (all labs ordered are listed, but only abnormal results are displayed)  Labs Reviewed  BASIC METABOLIC PANEL - Abnormal; Notable for the following:       Result Value   Glucose, Bld 115 (*)    Calcium 8.8 (*)    All other components within normal limits  CBC - Abnormal; Notable for the following:    WBC 19.2 (*)    All other  components within normal limits  TROPONIN I - Abnormal; Notable for the following:    Troponin I 0.04 (*)    All other components within normal limits  BLOOD GAS, VENOUS - Abnormal; Notable for the following:    pO2, Ven <31.0 (*)    Bicarbonate 30.6 (*)    Acid-Base Excess 4.0 (*)    All other components within normal limits  CULTURE, BLOOD (ROUTINE X 2)  CULTURE, BLOOD (ROUTINE X 2)  LACTIC ACID, PLASMA  LACTIC ACID, PLASMA   ____________________________________________  EKG  Reviewed and interpreted by me at 1752 Heart rate 90 QRS 120 QTc 480 Normal sinus rhythm, right bundle branch block without evidence of ischemia ____________________________________________  RADIOLOGY  Dg Chest Portable 1 View  Result Date: 05/22/2016 CLINICAL DATA:  Shortness of  breath EXAM: PORTABLE CHEST 1 VIEW COMPARISON:  04/23/2016, 02/07/2016 FINDINGS: The lungs are hyperinflated. Asymmetric increased opacity at the right lung base, suspicious for an infiltrate. Stable cardiomediastinal silhouette. No pneumothorax. IMPRESSION: Hyperinflation with asymmetric right lower lung opacity suspicious for infiltrate. Electronically Signed   By: Donavan Foil M.D.   On: 05/22/2016 19:17    X-ray reviewed by me, appears right middle to right lower lobe infiltrate noted. ____________________________________________   PROCEDURES  Procedure(s) performed: None  Procedures  Critical Care performed: No  ____________________________________________   INITIAL IMPRESSION / ASSESSMENT AND PLAN / ED COURSE  Pertinent labs & imaging results that were available during my care of the patient were reviewed by me and considered in my medical decision making (see chart for details).  Increasing cough, recent COPD exacerbation. Patient with now right-sided pain and productive cough. Feels fevers and chills. Focal right lower lobe crackles. Patient given nebulizer treatments with improvement, breathing improving,  but noted leukocytosis and on x-ray appears right-sided infiltrate. Patient meeting criteria for healthcare associated pneumonia.  No acute cardiac symptoms. EKG reassuring, the patient symptoms sound most suggestive of clinical pneumonia causing right-sided chest pain, no sudden onset or severe pleuritic pain, no signs of DVT on exam. My overall suspicion for pulmonary embolism is low, and I suspect clinically this represents acute pneumonia.  Clinical Course      ____________________________________________   FINAL CLINICAL IMPRESSION(S) / ED DIAGNOSES  Final diagnoses:  Healthcare-associated pneumonia  COPD exacerbation ([place])      NEW MEDICATIONS STARTED DURING THIS VISIT:  New Prescriptions   No medications on file     Note:  This document was prepared using Dragon voice recognition software and may include unintentional dictation errors.     Delman Kitten, MD 05/22/16 2029

## 2016-05-22 NOTE — H&P (Signed)
[place] [facility] [place] NAME: Michelle [name]    MR#:  [id number]  DATE OF BIRTH:  10-Jul-1960  DATE OF ADMISSION:  05/22/2016  PRIMARY CARE PHYSICIAN: No primary care provider on file.   REQUESTING/REFERRING PHYSICIAN: Dr. Vicente Males  CHIEF COMPLAINT:   Increasing shortness of breath and cough HISTORY OF PRESENT ILLNESS:  Michelle [name]  is a 55 y.o. female with a known history of End-stage COPD ongoing tobacco abuse on 2-1/2 L of oxygen, cocaine abuse, asthma, anxiety comes to the emergency room with increasing shortness of breath cough. She was Found to have fever of 99.3 tachycardia tachypnea elevated white count of 19,000 and chest x-ray consistent with right upper lobe pneumonia and she is being admitted with sepsis secondary to pneumonia. Patient was here during Thanksgiving time when she was diagnosed with COPD flare however her chest x-ray was negative at that time but she did receive a course of Levaquin for short. 5. She is being admitted for sepsis secondary to pneumonia. She received vancomycin and azithromycin and cefepime in the emergency room.  PAST MEDICAL HISTORY:   Past Medical History:  Diagnosis Date  . Allergy   . Anxiety   . Asthma   . CHF (congestive heart failure) ([place])   . Cocaine abuse   . COPD (chronic obstructive pulmonary disease) ([place])   . Coronary artery disease   . Emphysema/COPD ([place])   . Hepatitis C   . High cholesterol   . Hypertension   . Pneumothorax   . Tobacco abuse     PAST SURGICAL HISTOIRY:   Past Surgical History:  Procedure Laterality Date  . CARDIAC CATHETERIZATION    . CHEST TUBE INSERTION      SOCIAL HISTORY:   Social History  Substance Use Topics  . Smoking status: Current Every Day Smoker    Packs/day: 0.25    Years: 41.00    Types: Cigarettes  . Smokeless tobacco: Never Used  . Alcohol use No    FAMILY HISTORY:   Family History  Problem Relation Age of Onset  . Lung  cancer Mother   . Asthma Mother   . CVA Father   . CAD Father   . Stroke Father   . Breast cancer Maternal Aunt 67  . COPD Sister     DRUG ALLERGIES:  No Known Allergies  REVIEW OF SYSTEMS:  Review of Systems  Constitutional: Positive for fever. Negative for chills and weight loss.  HENT: Negative for ear discharge, ear pain and nosebleeds.   Eyes: Negative for blurred vision, pain and discharge.  Respiratory: Positive for cough, sputum production, shortness of breath and wheezing. Negative for stridor.   Cardiovascular: Negative for chest pain, palpitations, orthopnea and PND.  Gastrointestinal: Negative for abdominal pain, diarrhea, nausea and vomiting.  Genitourinary: Negative for frequency and urgency.  Musculoskeletal: Negative for back pain and joint pain.  Neurological: Positive for weakness. Negative for sensory change, speech change and focal weakness.  Psychiatric/Behavioral: Negative for depression and hallucinations. The patient is not nervous/anxious.      MEDICATIONS AT HOME:   Prior to Admission medications   Medication Sig Start Date End Date Taking? Authorizing Provider  albuterol (PROVENTIL HFA;VENTOLIN HFA) 108 (90 Base) MCG/ACT inhaler Inhale 2 puffs into the lungs every 6 (six) hours as needed for wheezing or shortness of breath. 11/07/15  Yes Srikar Sudini, MD  aspirin EC 81 MG tablet Take 81 mg by mouth daily.   Yes Historical  Provider, MD  carvedilol (COREG) 3.125 MG tablet Take 1 tablet (3.125 mg total) by mouth every morning. 02/10/16  Yes Gladstone Lighter, MD  enalapril (VASOTEC) 10 MG tablet Take 0.5 tablets (5 mg total) by mouth daily. 02/10/16  Yes Gladstone Lighter, MD  Fluticasone-Salmeterol (ADVAIR) 250-50 MCG/DOSE AEPB Inhale 1 puff into the lungs 2 (two) times daily. 02/10/16  Yes Gladstone Lighter, MD  ipratropium-albuterol (DUONEB) 0.5-2.5 (3) MG/3ML SOLN Take 3 mLs by nebulization every 6 (six) hours as needed. 04/27/16  Yes Gladstone Lighter, MD   tiotropium (SPIRIVA) 18 MCG inhalation capsule Place 1 capsule (18 mcg total) into inhaler and inhale daily. 02/10/16  Yes Gladstone Lighter, MD  feeding supplement, ENSURE ENLIVE, (ENSURE ENLIVE) LIQD Take 237 mLs by mouth 2 (two) times daily between meals. 10/27/14   Nicholes Mango, MD  LORazepam (ATIVAN) 1 MG tablet Take 1 tablet (1 mg total) by mouth 2 (two) times daily as needed for anxiety. 04/27/16   Gladstone Lighter, MD      VITAL SIGNS:  Blood pressure (!) 144/95, pulse 91, temperature 99 F (37.2 C), temperature source Oral, resp. rate (!) 27, height 5' (1.524 m), weight 36.3 kg (80 lb), last menstrual period 03/06/2005, SpO2 97 %.  PHYSICAL EXAMINATION:  GENERAL:  55 y.o.-year-old patient lying in the bed with no acute distress. Thin cachectic EYES: Pupils equal, round, reactive to light and accommodation. No scleral icterus. Extraocular muscles intact.  HEENT: Head atraumatic, normocephalic. Oropharynx and nasopharynx clear.  NECK:  Supple, no jugular venous distention. No thyroid enlargement, no tenderness.  LUNGS: Distant breath sounds bilaterally, no wheezing, rales,rhonchi or crepitation. No use of accessory muscles of respiration. Emphysematous chest CARDIOVASCULAR: S1, S2 normal. No murmurs, rubs, or gallops. Tachycardia ABDOMEN: Soft, nontender, nondistended. Bowel sounds present. No organomegaly or mass.  EXTREMITIES: No pedal edema, cyanosis, or clubbing.  NEUROLOGIC: Cranial nerves II through XII are intact. Muscle strength 5/5 in all extremities. Sensation intact. Gait not checked.  PSYCHIATRIC: The patient is alert and oriented x 3.  SKIN: No obvious rash, lesion, or ulcer.   LABORATORY PANEL:   CBC  Recent Labs Lab 05/22/16 1750  WBC 19.2*  HGB 13.0  HCT 39.9  PLT 248   ------------------------------------------------------------------------------------------------------------------  Chemistries   Recent Labs Lab 05/22/16 1750  NA 137  K 3.5  CL 103   CO2 28  GLUCOSE 115*  BUN 12  CREATININE 0.63  CALCIUM 8.8*   ------------------------------------------------------------------------------------------------------------------  Cardiac Enzymes No results for input(s): TROPONINI in the last 168 hours. ------------------------------------------------------------------------------------------------------------------  RADIOLOGY:  Dg Chest Portable 1 View  Result Date: 05/22/2016 CLINICAL DATA:  Shortness of breath EXAM: PORTABLE CHEST 1 VIEW COMPARISON:  04/23/2016, 02/07/2016 FINDINGS: The lungs are hyperinflated. Asymmetric increased opacity at the right lung base, suspicious for an infiltrate. Stable cardiomediastinal silhouette. No pneumothorax. IMPRESSION: Hyperinflation with asymmetric right lower lung opacity suspicious for infiltrate. Electronically Signed   By: Donavan Foil M.D.   On: 05/22/2016 19:17    EKG:  Sinus rhythm, right axis deviation  IMPRESSION AND PLAN:   Louellen Braly  is a 55 y.o. female with a known history of End-stage COPD ongoing tobacco abuse on 2-1/2 L of oxygen, cocaine abuse, asthma, anxiety comes to the emergency room with increasing shortness of breath cough. She was Found to have fever of 99.3 tachycardia tachypnea elevated white count of 19,000 and chest x-ray consistent with right upper lobe pneumonia and she is being admitted with sepsis secondary to pneumonia.  1. Sepsis secondary to  right lower lobe pneumonia -Admit to medical floor -IV Vanco and Levaquin. -Follow blood cultures sputum cultures. We'll order MRSA PCR's screen. De-escalate antibiotics once culture results obtained  2. Acute on chronic hypoxic respiratory failure secondary to end-stage emphysema/COPD exacerbation in the setting of pneumonia -By mouth prednisone taper, nebulizer, and oral inhalers -Incentive spirometer -Continue oxygen  3. Tobacco abuse Patient advised smoking cessation 3 minutes spent  4. Leukocytosis  secondary to #1  5. Chronic anxiety when necessary Ativan  6. Prophylaxis subcutaneous Lovenox  No family members present. Above was discussed with patient voiced understanding.  All the records are reviewed and case discussed with ED provider. Management plans discussed with the patient, family and they are in agreement.  CODE STATUS: full per pt TOTAL TIME TAKING CARE OF THIS PATIENT: 50 minutes.    Amayra Kiedrowski M.D on 05/22/2016 at 7:40 PM  Between 7am to 6pm - Pager - [phone]  After 6pm go to [email or web address] - password [email or web address]  [facility]  Office  [phone]  CC: Primary care physician; No primary care provider on file.

## 2016-05-22 NOTE — ED Triage Notes (Signed)
Pt here from home via [facility] with c/o shortness of breath, pt was recently treated for pneumonia. EMS reports pt was tachypenic upon their arrival. Pt reports she wears 2.5L of oxygen daily. EMS reports giving 3 duonebs en route, 125mg  of IV solumedrol.

## 2016-05-22 NOTE — Progress Notes (Addendum)
Pharmacy Antibiotic Note  Michelle [name] is a 55 y.o. female admitted on 05/22/2016 with  pneumonia.  Pharmacy has been consulted for Vancomycin dosing. Patient received 1 dose Vancomycin 1gm IV in ED. Patient is also on Levofloxacin.   Plan: Ke: 0.046  T1/2: 15   Vd: 25  Will start the patient on Vancomycin 500mg  IV every 18 hours with 16 hour stack dosing. Calculated trough at Css: 15. Will plan for trough draw prior to 5th dose.   Recommend MRSA PCR. If negative recommend D/C vancomycin. MRSA collected 11/20 was negative.   Height: 5' (152.4 cm) Weight: 80 lb (36.3 kg) IBW/kg (Calculated) : 45.5  Temp (24hrs), Avg:99 F (37.2 C), Min:99 F (37.2 C), Max:99 F (37.2 C)   Recent Labs Lab 05/22/16 1750 05/22/16 1859  WBC 19.2*  --   CREATININE 0.63  --   LATICACIDVEN  --  1.0    Estimated Creatinine Clearance: 46.1 mL/min (by C-G formula based on SCr of 0.63 mg/dL).    No Known Allergies  Antimicrobials this admission: 12/19 Vancomycin  >>  12/19 Levofloxacin >>   Dose adjustments this admission:   Microbiology results: BCx:  UCx:  Sputum:  MRSA PCR:   Thank you for allowing pharmacy to be a part of this patient's care.  Pernell Dupre, PharmD, BCPS Clinical Pharmacist 05/22/2016 8:13 PM

## 2016-05-23 DIAGNOSIS — E43 Unspecified severe protein-calorie malnutrition: Secondary | ICD-10-CM | POA: Insufficient documentation

## 2016-05-23 LAB — CBC
HEMATOCRIT: 35.4 % (ref 35.0–47.0)
HEMOGLOBIN: 11.5 g/dL — AB (ref 12.0–16.0)
MCH: 29.2 pg (ref 26.0–34.0)
MCHC: 32.3 g/dL (ref 32.0–36.0)
MCV: 90.2 fL (ref 80.0–100.0)
PLATELETS: 229 10*3/uL (ref 150–440)
RBC: 3.93 MIL/uL (ref 3.80–5.20)
RDW: 13.7 % (ref 11.5–14.5)
WBC: 14.4 10*3/uL — AB (ref 3.6–11.0)

## 2016-05-23 LAB — MRSA PCR SCREENING: MRSA by PCR: NEGATIVE

## 2016-05-23 MED ORDER — GUAIFENESIN-DM 100-10 MG/5ML PO SYRP
5.0000 mL | ORAL_SOLUTION | ORAL | Status: DC | PRN
  Administered 2016-05-23 – 2016-05-24 (×3): 5 mL via ORAL
  Filled 2016-05-23 (×3): qty 5

## 2016-05-23 MED ORDER — LORAZEPAM 2 MG/ML IJ SOLN
1.0000 mg | Freq: Four times a day (QID) | INTRAMUSCULAR | Status: DC | PRN
  Administered 2016-05-23 – 2016-05-24 (×5): 1 mg via INTRAVENOUS
  Filled 2016-05-23 (×5): qty 1

## 2016-05-23 NOTE — Evaluation (Signed)
Physical Therapy Evaluation Patient Details Name: Michelle [name] MRN: [id number] DOB: [birth year] Today's Date: 05/23/2016   History of Present Illness  Pt is admitted for sepsis and pneumonia. Pt complaints of cough and SOB. Pt with history of cocaine abuse, asthma, anxiety, and COPD. Pt on 2.5L of O2 at home.   Clinical Impression  Pt is a pleasant 55 year old female who was admitted for sepsis and pnemonia. Pt performs bed mobility with independence and transfers/ambulation with cga and IV pole. Pt is nearing baseline level. Pt demonstrates deficits with balance/endurance/mobility. Reports she is a limited household Ambulator secondary to SOB symptoms. All mobility performed on 3L of O2 with sats WNL. Would benefit from skilled PT to address above deficits and promote optimal return to PLOF. Recommend transition to [place] upon discharge from acute hospitalization.       Follow Up Recommendations Home health PT    Equipment Recommendations  None recommended by PT    Recommendations for Other Services       Precautions / Restrictions Precautions Precautions: Fall Restrictions Weight Bearing Restrictions: No      Mobility  Bed Mobility Overal bed mobility: Independent             General bed mobility comments: safe technique performed  Transfers Overall transfer level: Needs assistance Equipment used:  (IV pole) Transfers: Sit to/from Stand Sit to Stand: Min guard         General transfer comment: safe technique, once standing, slight unsteadiness, holds onto IV pole.  Ambulation/Gait Ambulation/Gait assistance: Min guard Ambulation Distance (Feet): 250 Feet Assistive device:  (IV pole) Gait Pattern/deviations: Step-through pattern     General Gait Details: ambulated using reciprocal gait pattern while pushing IV pole. Safe technique performed with no SOB symptoms. All mobility performed on 3L of O2 with sats WNL.   Stairs            Wheelchair  Mobility    Modified Rankin (Stroke Patients Only)       Balance Overall balance assessment: Needs assistance Sitting-balance support: Feet supported Sitting balance-Leahy Scale: Good     Standing balance support: Bilateral upper extremity supported Standing balance-Leahy Scale: Good                               Pertinent Vitals/Pain Pain Assessment: No/denies pain    Home Living Family/patient expects to be discharged to:: Private residence Living Arrangements: Children Available Help at Discharge: Family Type of Home: House Home Access: Stairs to enter Entrance Stairs-Rails: Can reach both Entrance Stairs-Number of Steps: 4 Home Layout: One level Home Equipment: Cane - single point      Prior Function Level of Independence: Independent         Comments: independent with mobility, however does use SPC occasionally     Hand Dominance        Extremity/Trunk Assessment   Upper Extremity Assessment Upper Extremity Assessment: Overall WFL for tasks assessed    Lower Extremity Assessment Lower Extremity Assessment: Generalized weakness (B LE grossly 4/5)       Communication   Communication: No difficulties  Cognition Arousal/Alertness: Awake/alert Behavior During Therapy: WFL for tasks assessed/performed Overall Cognitive Status: Within Functional Limits for tasks assessed                      General Comments      Exercises Other Exercises Other Exercises: Pt ambulated to bathroom  with cga with supervision for bathroom tasks and pt independent with hygiene. Safe technique performed   Assessment/Plan    PT Assessment Patient needs continued PT services  PT Problem List Decreased strength;Decreased activity tolerance;Decreased balance          PT Treatment Interventions Gait training;Therapeutic exercise;Balance training    PT Goals (Current goals can be found in the Care Plan section)  Acute Rehab PT Goals Patient  Stated Goal: to continue HHPT PT Goal Formulation: With patient Time For Goal Achievement: 06/06/16 Potential to Achieve Goals: Good    Frequency Min 2X/week   Barriers to discharge        Co-evaluation               End of Session Equipment Utilized During Treatment: Oxygen Activity Tolerance: Patient tolerated treatment well Patient left: in bed Nurse Communication: Mobility status         Time: [id number] PT Time Calculation (min) (ACUTE ONLY): 16 min   Charges:   PT Evaluation $PT Eval Low Complexity: 1 Procedure PT Treatments $Therapeutic Activity: 8-22 mins   PT G Codes:        Ebubechukwu Jedlicka 06-07-2016, 10:45 AM  Greggory Stallion, PT, DPT [phone]

## 2016-05-23 NOTE — Progress Notes (Signed)
Initial Nutrition Assessment  DOCUMENTATION CODES:   Severe malnutrition in context of chronic illness, Underweight  INTERVENTION:  Recommend liberalizing from Soft Diet to Regular Diet if not medically necessary as Soft Diet is a GI diet.  Discussed importance of adequate calories and protein through meals, snacks, and beverages in setting of patient's increased needs.  Provide Ensure Enlive po BID, each supplement provides 350 kcal and 20 grams of protein.  NUTRITION DIAGNOSIS:   Increased nutrient needs related to catabolic illness (End-Stage COPD) as evidenced by estimated needs.  GOAL:   Patient will meet greater than or equal to 90% of their needs  MONITOR:   PO intake, Supplement acceptance, Labs, Weight trends, I & O's  REASON FOR ASSESSMENT:   Other (Comment) (Low BMI)    ASSESSMENT:   55 y.o. female with a known history of End-stage COPD ongoing tobacco abuse on 2-1/2 L of oxygen, cocaine abuse, asthma, anxiety comes to the emergency room with increasing shortness of breath cough. She was Found to have fever of 99.3 tachycardia tachypnea elevated white count of 19,000 and chest x-ray consistent with right upper lobe pneumonia and she is being admitted with sepsis secondary to pneumonia.   Spoke with patient at bedside. She was drowsy. She reports having a good appetite except for when she has difficulty breathing while eating. She reports eating 2 meals per day and drinking 2 oral nutrition supplements daily (could not recall which brand she has). Reports occasional constipation. Denies N/V, abdominal pain, difficulty chewing/swallowing.  Patient reports her UBW was 150 lbs and she has been losing weight "for a while." Weights in chart fluctuate between 78-85 lbs this past year except for one weight of 138 lbs that was likely in error.  Medications reviewed and include: prednisone 40 mg daily (taper), vancomycin, NS @ 100 ml/hr.  Labs reviewed: Glucose 115, elevated  Troponin.   Nutrition-Focused physical exam completed. Findings are severe fat depletion, severe muscle depletion, and no edema. Patient meets criteria for severe chronic malnutrition in setting of NFPE of severe fat and muscle wasting.  Discussed with RN.   Diet Order:  DIET SOFT Room service appropriate? Yes; Fluid consistency: Thin  Skin:  Reviewed, no issues  Last BM:  05/21/2016  Height:   Ht Readings from Last 1 Encounters:  05/22/16 5' (1.524 m)    Weight:   Wt Readings from Last 1 Encounters:  05/22/16 80 lb (36.3 kg)    Ideal Body Weight:  45.5 kg  BMI:  Body mass index is 15.62 kg/m.  Estimated Nutritional Needs:   Kcal:  1210-1410 (HBE x 1.2-1.4)  Protein:  55-62 grams (1.5-1.7 grams/kg)  Fluid:  >/= 1.2 L/day (35 ml/kg)  EDUCATION NEEDS:   Education needs addressed  Willey Blade, MS, RD, LDN Pager: [phone] After Hours Pager: [phone]

## 2016-05-23 NOTE — Progress Notes (Signed)
Pharmacy Antibiotic Note  Michelle [name] is a 55 y.o. female admitted on 05/22/2016 with  pneumonia.  Pharmacy has been consulted for Vancomycin dosing. Patient received 1 dose Vancomycin 1gm IV in ED. Patient is also on Levofloxacin.   Plan: Ke: 0.046  T1/2: 15   Vd: 25  Will start the patient on Vancomycin 500mg  IV every 18 hours with 16 hour stack dosing. Calculated trough at Css: 15. Will plan for trough draw prior to 5th dose.   Recommend MRSA PCR. If negative recommend D/C vancomycin. MRSA collected 11/20 was negative.   Height: 5' (152.4 cm) Weight: 80 lb (36.3 kg) IBW/kg (Calculated) : 45.5  Temp (24hrs), Avg:98.2 F (36.8 C), Min:97.6 F (36.4 C), Max:99 F (37.2 C)   Recent Labs Lab 05/22/16 1750 05/22/16 1859 05/23/16 0555  WBC 19.2*  --  14.4*  CREATININE 0.63  --   --   LATICACIDVEN  --  1.0  --     Estimated Creatinine Clearance: 46.1 mL/min (by C-G formula based on SCr of 0.63 mg/dL).    No Known Allergies  Antimicrobials this admission: 12/19 Vancomycin  >>  12/19 Levofloxacin >>   Dose adjustments this admission:   Microbiology results: 12/19 BCx: NGTD UCx:  Sputum:  12/19 MRSA PCR: neg   Thank you for allowing pharmacy to be a part of this patient's care.  Loree Fee, PharmD 05/23/2016 9:48 AM

## 2016-05-23 NOTE — Progress Notes (Signed)
[facility] [place] NAME: Samus Werth    MR#:  [id number]  DATE OF BIRTH:  12-Nov-1960  SUBJECTIVE:  CHIEF COMPLAINT: pt s sob is better    REVIEW OF SYSTEMS:  CONSTITUTIONAL: No fever, fatigue or weakness.  EYES: No blurred or double vision.  EARS, NOSE, AND THROAT: No tinnitus or ear pain.  RESPIRATORY: has cough, exertional shortness of breath, wheezing or hemoptysis.  CARDIOVASCULAR: No chest pain, orthopnea, edema.  GASTROINTESTINAL: No nausea, vomiting, diarrhea or abdominal pain.  GENITOURINARY: No dysuria, hematuria.  ENDOCRINE: No polyuria, nocturia,  HEMATOLOGY: No anemia, easy bruising or bleeding SKIN: No rash or lesion. MUSCULOSKELETAL: No joint pain or arthritis.   NEUROLOGIC: No tingling, numbness, weakness.  PSYCHIATRY: No anxiety or depression.   DRUG ALLERGIES:  No Known Allergies  VITALS:  Blood pressure (!) 147/71, pulse 79, temperature 98 F (36.7 C), temperature source Oral, resp. rate 20, height 5' (1.524 m), weight 36.3 kg (80 lb), last menstrual period 03/06/2005, SpO2 97 %.  PHYSICAL EXAMINATION:  GENERAL:  55 y.o.-year-old patient lying in the bed with no acute distress.  EYES: Pupils equal, round, reactive to light and accommodation. No scleral icterus. Extraocular muscles intact.  HEENT: Head atraumatic, normocephalic. Oropharynx and nasopharynx clear.  NECK:  Supple, no jugular venous distention. No thyroid enlargement, no tenderness.  LUNGS: Mod breath sounds bilaterally, end exp wheezing, no  rales,rhonchi or crepitation. No use of accessory muscles of respiration.  CARDIOVASCULAR: S1, S2 normal. No murmurs, rubs, or gallops.  ABDOMEN: Soft, nontender, nondistended. Bowel sounds present. No organomegaly or mass.  EXTREMITIES: No pedal edema, cyanosis, or clubbing.  NEUROLOGIC: Cranial nerves II through XII are intact. Muscle strength 5/5 in all extremities. Sensation intact. Gait not checked.   PSYCHIATRIC: The patient is alert and oriented x 3.  SKIN: No obvious rash, lesion, or ulcer.    LABORATORY PANEL:   CBC  Recent Labs Lab 05/23/16 0555  WBC 14.4*  HGB 11.5*  HCT 35.4  PLT 229   ------------------------------------------------------------------------------------------------------------------  Chemistries   Recent Labs Lab 05/22/16 1750  NA 137  K 3.5  CL 103  CO2 28  GLUCOSE 115*  BUN 12  CREATININE 0.63  CALCIUM 8.8*   ------------------------------------------------------------------------------------------------------------------  Cardiac Enzymes  Recent Labs Lab 05/22/16 1750  TROPONINI 0.04*   ------------------------------------------------------------------------------------------------------------------  RADIOLOGY:  Dg Chest Portable 1 View  Result Date: 05/22/2016 CLINICAL DATA:  Shortness of breath EXAM: PORTABLE CHEST 1 VIEW COMPARISON:  04/23/2016, 02/07/2016 FINDINGS: The lungs are hyperinflated. Asymmetric increased opacity at the right lung base, suspicious for an infiltrate. Stable cardiomediastinal silhouette. No pneumothorax. IMPRESSION: Hyperinflation with asymmetric right lower lung opacity suspicious for infiltrate. Electronically Signed   By: Donavan Foil M.D.   On: 05/22/2016 19:17    EKG:   Orders placed or performed during the hospital encounter of 05/22/16  . ED EKG  . ED EKG  . EKG 12-Lead  . EKG 12-Lead    ASSESSMENT AND PLAN:   Shayana Colo  is a 56 y.o. female with a known history of End-stage COPD ongoing tobacco abuse on 2-1/2 L of oxygen, cocaine abuse, asthma, anxiety comes to the emergency room with increasing shortness of breath cough. She was Found to have fever of 99.3 tachycardia tachypnea elevated white count of 19,000 and chest x-ray consistent with right upper lobe pneumonia and she is being admitted with sepsis secondary to pneumonia.  1. Sepsis secondary to right lower lobe pneumonia --  IV  Vanco and Levaquin started, d/ced vanc MRSA PCR Neg -Follow blood cultures sputum cultures.   2. Acute on chronic hypoxic respiratory failure secondary to end-stage emphysema/COPD exacerbation in the setting of pneumonia -By mouth prednisone taper, nebulizer, and oral inhalers -Incentive spirometer -Continue oxygen  3. Tobacco abuse Patient advised smoking cessation 3 minutes spent  4. Leukocytosis secondary to #1  5. Chronic anxiety when necessary Ativan  6. Prophylaxis subcutaneous Lovenox     PT recommends HHPT  All the records are reviewed and case discussed with Care Management/Social Workerr. Management plans discussed with the patient, family and they are in agreement.  CODE STATUS: fc  TOTAL TIME TAKING CARE OF THIS PATIENT: 36  minutes.   POSSIBLE D/C IN 1-2 DAYS, DEPENDING ON CLINICAL CONDITION.  Note: This dictation was prepared with Dragon dictation along with smaller phrase technology. Any transcriptional errors that result from this process are unintentional.   Nicholes Mango M.D on 05/23/2016 at 4:22 PM  Between 7am to 6pm - Pager - [phone] After 6pm go to [email or web address] - password [email or web address] [email or web address]  [facility]  Office  [phone]  CC: Primary care physician; No primary care provider on file.

## 2016-05-24 LAB — CBC
HCT: 32.8 % — ABNORMAL LOW (ref 35.0–47.0)
HEMOGLOBIN: 10.7 g/dL — AB (ref 12.0–16.0)
MCH: 29.1 pg (ref 26.0–34.0)
MCHC: 32.6 g/dL (ref 32.0–36.0)
MCV: 89.3 fL (ref 80.0–100.0)
PLATELETS: 243 10*3/uL (ref 150–440)
RBC: 3.67 MIL/uL — ABNORMAL LOW (ref 3.80–5.20)
RDW: 14.3 % (ref 11.5–14.5)
WBC: 22.6 10*3/uL — ABNORMAL HIGH (ref 3.6–11.0)

## 2016-05-24 LAB — BLOOD GAS, VENOUS
Acid-Base Excess: 4 mmol/L — ABNORMAL HIGH (ref 0.0–2.0)
Bicarbonate: 30.6 mmol/L — ABNORMAL HIGH (ref 20.0–28.0)
FIO2: 0.32
PH VEN: 7.37 (ref 7.250–7.430)
Patient temperature: 37
pCO2, Ven: 53 mmHg (ref 44.0–60.0)
pO2, Ven: <31 mmHg — CL (ref 32.0–45.0)

## 2016-05-24 LAB — LIPID PANEL
CHOLESTEROL: 166 mg/dL (ref 0–200)
HDL: 63 mg/dL (ref >40)
LDL CALC: 85 mg/dL (ref 0–99)
TRIGLYCERIDES: 88 mg/dL (ref <150)
Total CHOL/HDL Ratio: 2.6 RATIO
VLDL: 18 mg/dL (ref 0–40)

## 2016-05-24 MED ORDER — OXYCODONE HCL 5 MG PO TABS
5.0000 mg | ORAL_TABLET | Freq: Four times a day (QID) | ORAL | Status: DC | PRN
  Administered 2016-05-24 – 2016-05-26 (×5): 5 mg via ORAL
  Filled 2016-05-24 (×5): qty 1

## 2016-05-24 MED ORDER — METHYLPREDNISOLONE SODIUM SUCC 40 MG IJ SOLR
40.0000 mg | Freq: Once | INTRAMUSCULAR | Status: AC
Start: 2016-05-24 — End: 2016-05-24
  Administered 2016-05-24: 40 mg via INTRAVENOUS
  Filled 2016-05-24: qty 1

## 2016-05-24 NOTE — Progress Notes (Signed)
Michelle [name] NAME: Michelle [name]    MR#:  [id number]  DATE OF BIRTH:  09/20/1960  SUBJECTIVE:  CHIEF COMPLAINT: pt s Feeling tight in her chest. Wants to feel better before she gets discharged  REVIEW OF SYSTEMS:  CONSTITUTIONAL: No fever, fatigue or weakness.  EYES: No blurred or double vision.  EARS, NOSE, AND THROAT: No tinnitus or ear pain.  RESPIRATORY: has cough, exertional shortness of breath, Some diffuse wheezing or hemoptysis.  CARDIOVASCULAR: No chest pain, orthopnea, edema.  GASTROINTESTINAL: No nausea, vomiting, diarrhea or abdominal pain.  GENITOURINARY: No dysuria, hematuria.  ENDOCRINE: No polyuria, nocturia,  HEMATOLOGY: No anemia, easy bruising or bleeding SKIN: No rash or lesion. MUSCULOSKELETAL: No joint pain or arthritis.   NEUROLOGIC: No tingling, numbness, weakness.  PSYCHIATRY: No anxiety or depression.   DRUG ALLERGIES:  No Known Allergies  VITALS:  Blood pressure (!) 160/79, pulse 77, temperature 97.5 F (36.4 C), temperature source Oral, resp. rate 18, height 5' (1.524 m), weight 36.3 kg (80 lb), last menstrual period 03/06/2005, SpO2 99 %.  PHYSICAL EXAMINATION:  GENERAL:  55 y.o.-year-old patient lying in the bed with no acute distress.  EYES: Pupils equal, round, reactive to light and accommodation. No scleral icterus. Extraocular muscles intact.  HEENT: Head atraumatic, normocephalic. Oropharynx and nasopharynx clear.  NECK:  Supple, no jugular venous distention. No thyroid enlargement, no tenderness.  LUNGS: Mod breath sounds bilaterally, end exp wheezing, no  rales,rhonchi or crepitation. No use of accessory muscles of respiration.  CARDIOVASCULAR: S1, S2 normal. No murmurs, rubs, or gallops.  ABDOMEN: Soft, nontender, nondistended. Bowel sounds present. No organomegaly or mass.  EXTREMITIES: No pedal edema, cyanosis, or clubbing.  NEUROLOGIC: Cranial nerves II through XII are intact.  Muscle strength 5/5 in all extremities. Sensation intact. Gait not checked.  PSYCHIATRIC: The patient is alert and oriented x 3.  SKIN: No obvious rash, lesion, or ulcer.    LABORATORY PANEL:   CBC  Recent Labs Lab 05/24/16 0439  WBC 22.6*  HGB 10.7*  HCT 32.8*  PLT 243   ------------------------------------------------------------------------------------------------------------------  Chemistries   Recent Labs Lab 05/22/16 1750  NA 137  K 3.5  CL 103  CO2 28  GLUCOSE 115*  BUN 12  CREATININE 0.63  CALCIUM 8.8*   ------------------------------------------------------------------------------------------------------------------  Cardiac Enzymes  Recent Labs Lab 05/22/16 1750  TROPONINI 0.04*   ------------------------------------------------------------------------------------------------------------------  RADIOLOGY:  Dg Chest Portable 1 View  Result Date: 05/22/2016 CLINICAL DATA:  Shortness of breath EXAM: PORTABLE CHEST 1 VIEW COMPARISON:  04/23/2016, 02/07/2016 FINDINGS: The lungs are hyperinflated. Asymmetric increased opacity at the right lung base, suspicious for an infiltrate. Stable cardiomediastinal silhouette. No pneumothorax. IMPRESSION: Hyperinflation with asymmetric right lower lung opacity suspicious for infiltrate. Electronically Signed   By: Donavan Foil M.D.   On: 05/22/2016 19:17    EKG:   Orders placed or performed during the hospital encounter of 05/22/16  . ED EKG  . ED EKG  . EKG 12-Lead  . EKG 12-Lead    ASSESSMENT AND PLAN:   Michelle [name]  is a 55 y.o. female with a known history of End-stage COPD ongoing tobacco abuse on 2-1/2 L of oxygen, cocaine abuse, asthma, anxiety comes to the emergency room with increasing shortness of breath cough. She was Found to have fever of 99.3 tachycardia tachypnea elevated white count of 19,000 and chest x-ray consistent with right upper lobe pneumonia and she is being admitted with sepsis  secondary  to pneumonia.  1. Sepsis secondary to right lower lobe pneumonia --IV Vanco and Levaquin started, d/ced vanc MRSA PCR Neg -Follow blood cultures Negative for 2 days sputum cultures  if sample collected  2. Acute on chronic hypoxic respiratory failure secondary to end-stage emphysema/COPD exacerbation in the setting of pneumonia -1 dose of Solu-Medrol 40 IV given today  -Incentive spirometry  -By mouth prednisone taper, nebulizer, and oral inhalers -Chest PT  3. Tobacco abuse Patient advised smoking cessation 3 minutes spent  4. Leukocytosis secondary to #1  5. Chronic anxiety when necessary Ativan  6. Prophylaxis subcutaneous Lovenox     PT recommends HHPT  All the records are reviewed and case discussed with Care Management/Social Workerr. Management plans discussed with the patient, family and they are in agreement.  CODE STATUS: fc  TOTAL TIME TAKING CARE OF THIS PATIENT: 36  minutes.   POSSIBLE D/C IN 1-2 DAYS, DEPENDING ON CLINICAL CONDITION.  Note: This dictation was prepared with Dragon dictation along with smaller phrase technology. Any transcriptional errors that result from this process are unintentional.   Michelle [name] M.D on 05/24/2016 at 4:33 PM  Between 7am to 6pm - Pager - [phone] After 6pm go to [email or web address] - password [email or web address]  [place] [facility]  Office  [phone]  CC: Primary care physician; No primary care provider on file.

## 2016-05-25 MED ORDER — LORAZEPAM 0.5 MG PO TABS
0.5000 mg | ORAL_TABLET | Freq: Two times a day (BID) | ORAL | Status: DC | PRN
  Administered 2016-05-25 – 2016-05-26 (×2): 0.5 mg via ORAL
  Filled 2016-05-25 (×3): qty 1

## 2016-05-25 MED ORDER — SIMVASTATIN 20 MG PO TABS
20.0000 mg | ORAL_TABLET | Freq: Every day | ORAL | Status: DC
  Administered 2016-05-25 – 2016-05-26 (×2): 20 mg via ORAL
  Filled 2016-05-25 (×2): qty 1

## 2016-05-25 NOTE — Progress Notes (Signed)
PT Cancellation Note  Patient Details Name: [name] MRN: [id number] DOB: [birth year]   Cancelled Treatment:    Reason Eval/Treat Not Completed: Other (comment). Treatment attempted, however pt reports she recently ambulated with RN staff. Pt ambulated on 3L of O2 and sats at 98% with exertion. Pt reports she is performing IS, however unable to find equipment in room. Educated on endurance technique and pursed lip breathing. Pt feels she is slowly improving. Encouraged to continue ambulation efforts with RN later today. Will continue to follow one more trial for any PT needs.   Eiko Mcgowen 05/25/2016, 10:50 AM Greggory Stallion, PT, DPT [phone]

## 2016-05-25 NOTE — Progress Notes (Signed)
[facility] at [place] NAME: Michelle [name]    MR#:  [id number]  DATE OF BIRTH:  January 08, 1961  SUBJECTIVE:  CHIEF COMPLAINT: pt wants to feel better before she gets discharged  REVIEW OF SYSTEMS:  CONSTITUTIONAL: No fever, fatigue or weakness.  EYES: No blurred or double vision.  EARS, NOSE, AND THROAT: No tinnitus or ear pain.  RESPIRATORY: has cough, exertional shortness of breath, Some diffuse wheezing or hemoptysis.  CARDIOVASCULAR: No chest pain, orthopnea, edema.  GASTROINTESTINAL: No nausea, vomiting, diarrhea or abdominal pain.  GENITOURINARY: No dysuria, hematuria.  ENDOCRINE: No polyuria, nocturia,  HEMATOLOGY: No anemia, easy bruising or bleeding SKIN: No rash or lesion. MUSCULOSKELETAL: No joint pain or arthritis.   NEUROLOGIC: No tingling, numbness, weakness.  PSYCHIATRY: No anxiety or depression.   DRUG ALLERGIES:  No Known Allergies  VITALS:  Blood pressure 126/71, pulse 73, temperature 98.2 F (36.8 C), temperature source Oral, resp. rate 18, height 5' (1.524 m), weight 36.3 kg (80 lb), last menstrual period 03/06/2005, SpO2 99 %.  PHYSICAL EXAMINATION:  GENERAL:  55 y.o.-year-old patient lying in the bed with no acute distress.  EYES: Pupils equal, round, reactive to light and accommodation. No scleral icterus. Extraocular muscles intact.  HEENT: Head atraumatic, normocephalic. Oropharynx and nasopharynx clear.  NECK:  Supple, no jugular venous distention. No thyroid enlargement, no tenderness.  LUNGS: Mod breath sounds bilaterally, end exp wheezing, no  rales,rhonchi or crepitation. No use of accessory muscles of respiration.  CARDIOVASCULAR: S1, S2 normal. No murmurs, rubs, or gallops.  ABDOMEN: Soft, nontender, nondistended. Bowel sounds present. No organomegaly or mass.  EXTREMITIES: No pedal edema, cyanosis, or clubbing.  NEUROLOGIC: Cranial nerves II through XII are intact. Muscle strength 5/5 in all  extremities. Sensation intact. Gait not checked.  PSYCHIATRIC: The patient is alert and oriented x 3.  SKIN: No obvious rash, lesion, or ulcer.    LABORATORY PANEL:   CBC  Recent Labs Lab 05/24/16 0439  WBC 22.6*  HGB 10.7*  HCT 32.8*  PLT 243   ------------------------------------------------------------------------------------------------------------------  Chemistries   Recent Labs Lab 05/22/16 1750  NA 137  K 3.5  CL 103  CO2 28  GLUCOSE 115*  BUN 12  CREATININE 0.63  CALCIUM 8.8*   ------------------------------------------------------------------------------------------------------------------  Cardiac Enzymes  Recent Labs Lab 05/22/16 1750  TROPONINI 0.04*   ------------------------------------------------------------------------------------------------------------------  RADIOLOGY:  No results found.  EKG:   Orders placed or performed during the hospital encounter of 05/22/16  . ED EKG  . ED EKG  . EKG 12-Lead  . EKG 12-Lead    ASSESSMENT AND PLAN:   Michelle [name]  is a 55 y.o. female with a known history of End-stage COPD ongoing tobacco abuse on 2-1/2 L of oxygen, cocaine abuse, asthma, anxiety comes to the emergency room with increasing shortness of breath cough. She was Found to have fever of 99.3 tachycardia tachypnea elevated white count of 19,000 and chest x-ray consistent with right upper lobe pneumonia and she is being admitted with sepsis secondary to pneumonia.  1. Sepsis secondary to right lower lobe pneumonia -Clinically improving --IV Vanco and Levaquin started, d/ced vanc MRSA PCR Neg -Follow blood cultures Negative for 2 days sputum cultures  if sample collected -Anticipate discharge in a.m.  2. Acute on chronic hypoxic respiratory failure secondary to end-stage emphysema/COPD exacerbation in the setting of pneumonia -1 dose of Solu-Medrol 40 IV given today  -Incentive spirometry  -By mouth prednisone taper, nebulizer, and  oral  inhalers -Chest PT  3. Tobacco abuse Patient advised smoking cessation 3 minutes spent  4. Leukocytosis secondary to #1  5. Chronic anxiety when necessary Ativan  6. Prophylaxis subcutaneous Lovenox     PT recommends HHPT  All the records are reviewed and case discussed with Care Management/Social Workerr. Management plans discussed with the patient, family and they are in agreement.  CODE STATUS: fc  TOTAL TIME TAKING CARE OF THIS PATIENT: 36  minutes.   POSSIBLE D/C IN 1-2 DAYS, DEPENDING ON CLINICAL CONDITION.  Note: This dictation was prepared with Dragon dictation along with smaller phrase technology. Any transcriptional errors that result from this process are unintentional.   Nicholes Mango M.D on 05/25/2016 at 3:28 PM  Between 7am to 6pm - Pager - [phone] After 6pm go to [email or web address] - password [email or web address] [facility]  [place] [facility]  Office  [phone]  CC: Primary care physician; No primary care provider on file.

## 2016-05-25 NOTE — Care Management (Signed)
Patient admitted for PNA, and COPD.  Patient lives at home with daughter.  [facility]  Chronic O2 through [facility].  Received a nebulizer through [facility] 04/27/16. PT has assessed patient and recommend home health PT however patient does not qualify due to medicaid coverage.  Nursing staff has had patient up today, and ambulated her around the nursing station.  RNCM following for discharge planning

## 2016-05-26 ENCOUNTER — Inpatient Hospital Stay: Payer: Medicaid Other

## 2016-05-26 MED ORDER — ZOLPIDEM TARTRATE 5 MG PO TABS
5.0000 mg | ORAL_TABLET | Freq: Once | ORAL | Status: AC
  Administered 2016-05-26: 5 mg via ORAL
  Filled 2016-05-26: qty 1

## 2016-05-26 NOTE — Progress Notes (Addendum)
[facility] NAME: Michelle [name]    MR#:  [id number]  DATE OF BIRTH:  20-Apr-1961  SUBJECTIVE:  CHIEF COMPLAINT:   Chief Complaint  Patient presents with  . Shortness of Breath  Complaints of right upper quadrant abdominal pain and shortness of breath REVIEW OF SYSTEMS:  Review of Systems  Constitutional: Positive for malaise/fatigue. Negative for chills, fever and weight loss.  HENT: Negative for nosebleeds and sore throat.   Eyes: Negative for blurred vision.  Respiratory: Positive for shortness of breath. Negative for cough and wheezing.   Cardiovascular: Negative for chest pain, orthopnea, leg swelling and PND.  Gastrointestinal: Positive for abdominal pain. Negative for constipation, diarrhea, heartburn, nausea and vomiting.  Genitourinary: Negative for dysuria and urgency.  Musculoskeletal: Negative for back pain.  Skin: Negative for rash.  Neurological: Positive for weakness. Negative for dizziness, speech change, focal weakness and headaches.  Endo/Heme/Allergies: Does not bruise/bleed easily.  Psychiatric/Behavioral: Negative for depression.    DRUG ALLERGIES:  No Known Allergies VITALS:  Blood pressure 128/75, pulse 88, temperature 98 F (36.7 C), temperature source Oral, resp. rate 18, height 5' (1.524 m), weight 36.3 kg (80 lb), last menstrual period 03/06/2005, SpO2 98 %. PHYSICAL EXAMINATION:  Physical Exam  Constitutional: She is oriented to person, place, and time and well-developed, well-nourished, and in no distress.  HENT:  Head: Normocephalic and atraumatic.  Eyes: Conjunctivae and EOM are normal. Pupils are equal, round, and reactive to light.  Neck: Normal range of motion. Neck supple. No tracheal deviation present. No thyromegaly present.  Cardiovascular: Normal rate, regular rhythm and normal heart sounds.   Pulmonary/Chest: She is in respiratory distress. She has wheezes. She exhibits no tenderness.    Abdominal: Soft. Bowel sounds are normal. She exhibits no distension. There is no tenderness.  Musculoskeletal: Normal range of motion.  Neurological: She is alert and oriented to person, place, and time. No cranial nerve deficit.  Skin: Skin is warm and dry. No rash noted.  Psychiatric: Mood and affect normal.   LABORATORY PANEL:   CBC  Recent Labs Lab 05/24/16 0439  WBC 22.6*  HGB 10.7*  HCT 32.8*  PLT 243   ------------------------------------------------------------------------------------------------------------------ Chemistries   Recent Labs Lab 05/22/16 1750  NA 137  K 3.5  CL 103  CO2 28  GLUCOSE 115*  BUN 12  CREATININE 0.63  CALCIUM 8.8*   RADIOLOGY:  No results found. ASSESSMENT AND PLAN:  Elianny Antoniak a 55 y.o. femalewith a known history of End-stage COPD ongoing tobacco abuse on 2-1/2 L of oxygen, cocaine abuse, asthma, anxiety comes to the emergency room with increasing shortness of breath cough. She was Found to have fever of 99.3 tachycardia tachypnea elevated white count of 19,000 and chest x-ray consistent with right upper lobe pneumonia and she is being admitted with sepsis secondary to pneumonia.  1. Sepsis secondary to right lower lobe pneumonia: Present on admission - Resolved - Continue Levaquin  2. Acute on chronic hypoxic respiratory failure secondary to end-stage emphysema/COPD exacerbation in the setting of pneumonia -Incentive spirometry  -By mouth prednisone taper, nebulizer, and oral inhalers - We will repeat chest x-ray today and get right upper quadrant ultrasound as she is complaining of some pain there - r/o gallstones  3. Tobacco abuse Patient advised smoking cessation 3 minutes spent by admitting physician  4. Leukocytosis: Worsened secondary to #1 but can also be due to steroids.  Monitor  5. Chronic anxiety when necessary  Ativan  6. Prophylaxis subcutaneous Lovenox     All the records are reviewed and case  discussed with Care Management/Social Worker. Management plans discussed with the patient, family and they are in agreement.  CODE STATUS: Full code  TOTAL TIME TAKING CARE OF THIS PATIENT: 35 minutes.   More than 50% of the time was spent in counseling/coordination of care: YES  POSSIBLE D/C IN 1-2 DAYS, DEPENDING ON CLINICAL CONDITION.   Max Sane M.D on 05/26/2016 at 12:32 PM  Between 7am to 6pm - Pager - [phone]  After 6pm go to [email or web address] - Technical brewer [facility]  Office  [phone]  CC: Primary care physician; No primary care provider on file.  Note: This dictation was prepared with Dragon dictation along with smaller phrase technology. Any transcriptional errors that result from this process are unintentional.

## 2016-05-26 NOTE — Progress Notes (Signed)
Physical Therapy Treatment Patient Details Name: Michelle [name] MRN: [id number] DOB: Oct 11, 1960 Today's Date: 05/26/2016    History of Present Illness Pt is admitted for sepsis and pneumonia. Pt complaints of cough and SOB. Pt with history of cocaine abuse, asthma, anxiety, and COPD. Pt on 2.5L of O2 at home.     PT Comments    Pt agreeable to PT; reports right lower ribcage pain. Denies shortness of breath, but complains of overall fatigue/weakness. Pt demonstrates bed mobility and transfers with independence and no safety issues. Mild lightheadedness with stand that subsides. Pt progressing ambulation distance and performs stairs consecutively with 1 seated and 1 stand short rest breaks. O2 saturation is 96% on 2 liters throughout activity without heart rate the same of less (78-88 beats per minute). Pt received up in chair comfortably. Continue PT to progress independence with all activities to allow for return home at prior level of function.   Follow Up Recommendations  Home health PT     Equipment Recommendations  None recommended by PT    Recommendations for Other Services       Precautions / Restrictions Precautions Precautions: Fall Restrictions Weight Bearing Restrictions: No    Mobility  Bed Mobility Overal bed mobility: Independent             General bed mobility comments: No issues  Transfers Overall transfer level: Independent Equipment used: None Transfers: Sit to/from [employer] transfer comment: Able to stand without use of hands  Ambulation/Gait Ambulation/Gait assistance: Supervision Ambulation Distance (Feet): 300 Feet Assistive device: None Gait Pattern/deviations: Step-through pattern Gait velocity: decreased Gait velocity interpretation: Below normal speed for age/gender General Gait Details: Decreased speed; assist for portable O2 tank only. 1 seated rest breatk and 1 stand rest break. O2 saturation remains 96% on 2L  throughout ambulation/stairs   Stairs Stairs: Yes   Stair Management: One rail Left;One rail Right;Alternating pattern;Step to pattern;Forwards Number of Stairs: 4 General stair comments: Use of 1 rail L reciprocal pattern ascending; 1 rail R step to pattern descending. Safe   Wheelchair Mobility    Modified Rankin (Stroke Patients Only)       Balance Overall balance assessment: Modified Independent                                  Cognition Arousal/Alertness: Awake/alert Behavior During Therapy: WFL for tasks assessed/performed Overall Cognitive Status: Within Functional Limits for tasks assessed                      Exercises      General Comments        Pertinent Vitals/Pain Pain Assessment:  (R lower ribcage)    Home Living                      Prior Function            PT Goals (current goals can now be found in the care plan section) Progress towards PT goals: Progressing toward goals    Frequency    Min 2X/week      PT Plan Current plan remains appropriate    Co-evaluation             End of Session Equipment Utilized During Treatment: Oxygen Activity Tolerance: Patient tolerated treatment well;Patient limited by fatigue Patient left: in chair;with call bell/phone  within reach (refused chair alarm; will call to get up)     Time: [id number] PT Time Calculation (min) (ACUTE ONLY): 17 min  Charges:  $Gait Training: 8-22 mins                    G Codes:      Larae Grooms, PTA 05/26/2016, 12:24 PM

## 2016-05-27 ENCOUNTER — Inpatient Hospital Stay: Payer: Medicaid Other

## 2016-05-27 LAB — BASIC METABOLIC PANEL
Anion gap: 6 (ref 5–15)
BUN: 16 mg/dL (ref 6–20)
CALCIUM: 8.2 mg/dL — AB (ref 8.9–10.3)
CO2: 29 mmol/L (ref 22–32)
CREATININE: 0.57 mg/dL (ref 0.44–1.00)
Chloride: 105 mmol/L (ref 101–111)
GFR calc non Af Amer: >60 mL/min (ref >60)
Glucose, Bld: 91 mg/dL (ref 65–99)
Potassium: 3.8 mmol/L (ref 3.5–5.1)
Sodium: 140 mmol/L (ref 135–145)

## 2016-05-27 LAB — CBC
HEMATOCRIT: 36.3 % (ref 35.0–47.0)
Hemoglobin: 11.8 g/dL — ABNORMAL LOW (ref 12.0–16.0)
MCH: 29.2 pg (ref 26.0–34.0)
MCHC: 32.5 g/dL (ref 32.0–36.0)
MCV: 89.6 fL (ref 80.0–100.0)
Platelets: 297 10*3/uL (ref 150–440)
RBC: 4.05 MIL/uL (ref 3.80–5.20)
RDW: 13.8 % (ref 11.5–14.5)
WBC: 10.3 10*3/uL (ref 3.6–11.0)

## 2016-05-27 LAB — CULTURE, BLOOD (ROUTINE X 2)
Culture: NO GROWTH
Culture: NO GROWTH

## 2016-05-27 MED ORDER — ALBUTEROL SULFATE HFA 108 (90 BASE) MCG/ACT IN AERS: 2.0000 | INHALATION_SPRAY | Freq: Four times a day (QID) | RESPIRATORY_TRACT | 0 refills | Status: DC | PRN

## 2016-05-27 MED ORDER — LEVOFLOXACIN 500 MG PO TABS
500.0000 mg | ORAL_TABLET | Freq: Every day | ORAL | 0 refills | Status: DC
Start: 2016-05-27 — End: 2017-01-01

## 2016-05-27 MED ORDER — GUAIFENESIN-DM 100-10 MG/5ML PO SYRP: 5.0000 mL | ORAL_SOLUTION | ORAL | 0 refills | Status: DC | PRN

## 2016-05-27 MED ORDER — LORAZEPAM 0.5 MG PO TABS: 0.5000 mg | ORAL_TABLET | Freq: Every evening | ORAL | 0 refills | Status: DC | PRN

## 2016-05-27 MED ORDER — PREDNISONE 10 MG (21) PO TBPK: 10.0000 mg | ORAL_TABLET | Freq: Every day | ORAL | 0 refills | Status: DC

## 2016-05-27 MED ORDER — LEVOFLOXACIN 500 MG PO TABS: 500.0000 mg | ORAL_TABLET | Freq: Every day | ORAL | 0 refills | Status: DC

## 2016-05-27 MED ORDER — MOMETASONE FURO-FORMOTEROL FUM 200-5 MCG/ACT IN AERO: 2.0000 | INHALATION_SPRAY | Freq: Two times a day (BID) | RESPIRATORY_TRACT | 0 refills | Status: DC

## 2016-05-27 NOTE — Progress Notes (Signed)
Pt d/c home; d/c instructions reviewed w/ pt; pt understanding was verbalized; IV removed, catheter in tact, gauze dressing applied; all pt questions answered; pt verbalized that all pt belongings were accounted for; pt left unit via wheelchair accompanied by staff 

## 2016-05-27 NOTE — Care Management Note (Addendum)
Case Management Note  Patient Details  Name: Michelle [name] MRN: [id number] Date of Birth: [birth year]  Subjective/Objective:        Does not qualify for HH-PT per payer source is Medicaid. Ambulating well as an inpatient.  Chronic 02 N/C provided by [facility].            Action/Plan:   Expected Discharge Date:                  Expected Discharge Plan:     In-House Referral:     Discharge planning Services     Post Acute Care Choice:    Choice offered to:     DME Arranged:    DME Agency:     HH Arranged:    HH Agency:     Status of Service:     If discussed at [employer] of Stay Meetings, dates discussed:    Additional Comments:  Carlicia Leavens A, RN 05/27/2016, 10:04 AM

## 2016-05-27 NOTE — Discharge Instructions (Signed)
Chronic Obstructive Pulmonary Disease Exacerbation Chronic obstructive pulmonary disease (COPD) is a common lung problem. In COPD, the flow of air from the lungs is limited. COPD exacerbations are times that breathing gets worse and you need extra treatment. Without treatment they can be life threatening. If they happen often, your lungs can become more damaged. If your COPD gets worse, your doctor may treat you with:  Medicines.  Oxygen.  Different ways to clear your airway, such as using a mask. Follow these instructions at home:  Do not smoke.  Avoid tobacco smoke and other things that bother your lungs.  If given, take your antibiotic medicine as told. Finish the medicine even if you start to feel better.  Only take medicines as told by your doctor.  Drink enough fluids to keep your pee (urine) clear or pale yellow (unless your doctor has told you not to).  Use a cool mist machine (vaporizer).  If you use oxygen or a machine that turns liquid medicine into a mist (nebulizer), continue to use them as told.  Keep up with shots (vaccinations) as told by your doctor.  Exercise regularly.  Eat healthy foods.  Keep all doctor visits as told. Get help right away if:  You are very short of breath and it gets worse.  You have trouble talking.  You have bad chest pain.  You have blood in your spit (sputum).  You have a fever.  You keep throwing up (vomiting).  You feel weak, or you pass out (faint).  You feel confused.  You keep getting worse. This information is not intended to replace advice given to you by your health care provider. Make sure you discuss any questions you have with your health care provider. Document Released: 05/10/2011 Document Revised: 10/27/2015 Document Reviewed: 01/23/2013 Elsevier Interactive Patient Education  2017 [place] Pneumonia, Adult Introduction Pneumonia is an infection of the lungs. One type of  pneumonia can happen while a person is in a hospital. A different type can happen when a person is not in a hospital (community-acquired pneumonia). It is easy for this kind to spread from person to person. It can spread to you if you breathe near an infected person who coughs or sneezes. Some symptoms include:  A dry cough.  A wet (productive) cough.  Fever.  Sweating.  Chest pain. Follow these instructions at home:  Take over-the-counter and prescription medicines only as told by your doctor.  Only take cough medicine if you are losing sleep.  If you were prescribed an antibiotic medicine, take it as told by your doctor. Do not stop taking the antibiotic even if you start to feel better.  Sleep with your head and neck raised (elevated). You can do this by putting a few pillows under your head, or you can sleep in a recliner.  Do not use tobacco products. These include cigarettes, chewing tobacco, and e-cigarettes. If you need help quitting, ask your doctor.  Drink enough water to keep your pee (urine) clear or pale yellow. A shot (vaccine) can help prevent pneumonia. Shots are often suggested for:  People older than 54 years of age.  People older than 54 years of age:  Who are having cancer treatment.  Who have long-term (chronic) lung disease.  Who have problems with their body's defense system (immune system). You may also prevent pneumonia if you take these actions:  Get the flu (influenza) shot every year.  Go to the dentist as often  as told.  Wash your hands often. If soap and water are not available, use hand sanitizer. Contact a doctor if:  You have a fever.  You lose sleep because your cough medicine does not help. Get help right away if:  You are short of breath and it gets worse.  You have more chest pain.  Your sickness gets worse. This is very serious if:  You are an older adult.  Your body's defense system is weak.  You cough up blood. This  information is not intended to replace advice given to you by your health care provider. Make sure you discuss any questions you have with your health care provider. Document Released: 11/07/2007 Document Revised: 10/27/2015 Document Reviewed: 09/15/2014  2017 Elsevier   Follow all MD discharge instructions. Take all medications as prescribed. Keep all follow up appointments. If your symptoms return, call your doctor. If you experience any new symptoms that are of concern to you or that are bothersome to you, call your doctor. For all questions and/or concerns, call your doctor.   If you have a medical emergency, call 911

## 2016-05-27 NOTE — Progress Notes (Signed)
Pt is using flutter for CPT

## 2016-05-30 NOTE — Discharge Summary (Signed)
[facility] NAME: Michelle [name]    MR#:  [id number]  DATE OF BIRTH:  [birth year]  DATE OF ADMISSION:  05/22/2016   ADMITTING PHYSICIAN: Fritzi Mandes, MD  DATE OF DISCHARGE: 05/27/2016  1:40 PM  PRIMARY CARE PHYSICIAN: [place]   ADMISSION DIAGNOSIS:  Healthcare-associated pneumonia [J18.9] COPD exacerbation ([place]) [J44.1] DISCHARGE DIAGNOSIS:  Active Problems:   Sepsis ([place])   Protein-calorie malnutrition, severe  SECONDARY DIAGNOSIS:   Past Medical History:  Diagnosis Date  . Allergy   . Anxiety   . Asthma   . CHF (congestive heart failure) ([place])   . Cocaine abuse   . COPD (chronic obstructive pulmonary disease) ([place])   . Coronary artery disease   . Emphysema/COPD ([place])   . Hepatitis C   . High cholesterol   . Hypertension   . Pneumothorax   . Tobacco abuse    HOSPITAL COURSE:  Michelle [name] a 55 y.o. femalewith a known history of End-stage COPD ongoing tobacco abuse on 2-1/2 L of oxygen, cocaine abuse, asthma, anxiety , admitted with increasing shortness of breath cough. She was Found to have fever of 99.3 tachycardia tachypnea elevated white count of 19,000 and chest x-ray consistent with right upper lobe pneumonia and she is being admitted with sepsis secondary to pneumonia.  1. Sepsis secondary to right lower lobe pneumonia: Present on admission - Resolved with treatment  2. Acute on chronic hypoxic respiratory failure secondary to end-stage emphysema/COPD exacerbation in the setting of pneumonia -Improved with steroids and antibiotics along with nebulizer breathing treatment  3. Tobacco abuse Patient advised smoking cessation 3 minutes spent by admitting physician  4. Leukocytosis: Worsened secondary to #1 but can also be due to steroids.  Monitor  5. Chronic anxiety when necessary Ativan  DISCHARGE CONDITIONS:  stable CONSULTS OBTAINED:   DRUG ALLERGIES:  No Known  Allergies DISCHARGE MEDICATIONS:   Allergies as of 05/27/2016   No Known Allergies     Medication List    STOP taking these medications   Fluticasone-Salmeterol 250-50 MCG/DOSE Aepb Commonly known as:  ADVAIR Replaced by:  mometasone-formoterol 200-5 MCG/ACT Aero     TAKE these medications   albuterol 108 (90 Base) MCG/ACT inhaler Commonly known as:  PROVENTIL HFA;VENTOLIN HFA Inhale 2 puffs into the lungs every 6 (six) hours as needed for wheezing or shortness of breath.   aspirin EC 81 MG tablet Take 81 mg by mouth daily.   carvedilol 3.125 MG tablet Commonly known as:  COREG Take 1 tablet (3.125 mg total) by mouth every morning.   enalapril 10 MG tablet Commonly known as:  VASOTEC Take 0.5 tablets (5 mg total) by mouth daily.   feeding supplement (ENSURE ENLIVE) Liqd Take 237 mLs by mouth 2 (two) times daily between meals.   guaiFENesin-dextromethorphan 100-10 MG/5ML syrup Commonly known as:  ROBITUSSIN DM Take 5 mLs by mouth every 4 (four) hours as needed for cough (chest congestion).   ipratropium-albuterol 0.5-2.5 (3) MG/3ML Soln Commonly known as:  DUONEB Take 3 mLs by nebulization every 6 (six) hours as needed.   levofloxacin 500 MG tablet Commonly known as:  LEVAQUIN Take 1 tablet (500 mg total) by mouth daily.   LORazepam 1 MG tablet Commonly known as:  ATIVAN Take 1 tablet (1 mg total) by mouth 2 (two) times daily as needed for anxiety. What changed:  Another medication with the same name was added. Make sure you understand how and when  to take each.   LORazepam 0.5 MG tablet Commonly known as:  ATIVAN Take 1 tablet (0.5 mg total) by mouth at bedtime as needed for anxiety (anxiety). What changed:  You were already taking a medication with the same name, and this prescription was added. Make sure you understand how and when to take each.   mometasone-formoterol 200-5 MCG/ACT Aero Commonly known as:  DULERA Inhale 2 puffs into the lungs 2 (two) times  daily. Replaces:  Fluticasone-Salmeterol 250-50 MCG/DOSE Aepb   predniSONE 10 MG (21) Tbpk tablet Commonly known as:  STERAPRED UNI-PAK 21 TAB Take 1 tablet (10 mg total) by mouth daily. Start 60 mg once daily, taper 10 mg daily until done   simvastatin 20 MG tablet Commonly known as:  ZOCOR Take 20 mg by mouth daily.   tiotropium 18 MCG inhalation capsule Commonly known as:  SPIRIVA Place 1 capsule (18 mcg total) into inhaler and inhale daily.      DISCHARGE INSTRUCTIONS:   DIET:  Regular diet DISCHARGE CONDITION:  Good ACTIVITY:  Activity as tolerated OXYGEN:  Home Oxygen: No.  Oxygen Delivery: room air DISCHARGE LOCATION:  home   If you experience worsening of your admission symptoms, develop shortness of breath, life threatening emergency, suicidal or homicidal thoughts you must seek medical attention immediately by calling 911 or calling your MD immediately  if symptoms less severe.  You Must read complete instructions/literature along with all the possible adverse reactions/side effects for all the Medicines you take and that have been prescribed to you. Take any new Medicines after you have completely understood and accpet all the possible adverse reactions/side effects.   Please note  You were cared for by a hospitalist during your hospital stay. If you have any questions about your discharge medications or the care you received while you were in the hospital after you are discharged, you can call the unit and asked to speak with the hospitalist on call if the hospitalist that took care of you is not available. Once you are discharged, your primary care physician will handle any further medical issues. Please note that NO REFILLS for any discharge medications will be authorized once you are discharged, as it is imperative that you return to your primary care physician (or establish a relationship with a primary care physician if you do not have one) for your aftercare  needs so that they can reassess your need for medications and monitor your lab values.    On the day of Discharge:  VITAL SIGNS:  Blood pressure 131/87, pulse (!) 59, temperature 98.3 F (36.8 C), temperature source Oral, resp. rate 20, height 5' (1.524 m), weight 36.3 kg (80 lb), last menstrual period 03/06/2005, SpO2 95 %. PHYSICAL EXAMINATION:  GENERAL:  54 y.o.-year-old patient lying in the bed with no acute distress.  EYES: Pupils equal, round, reactive to light and accommodation. No scleral icterus. Extraocular muscles intact.  HEENT: Head atraumatic, normocephalic. Oropharynx and nasopharynx clear.  NECK:  Supple, no jugular venous distention. No thyroid enlargement, no tenderness.  LUNGS: Normal breath sounds bilaterally, no wheezing, rales,rhonchi or crepitation. No use of accessory muscles of respiration.  CARDIOVASCULAR: S1, S2 normal. No murmurs, rubs, or gallops.  ABDOMEN: Soft, non-tender, non-distended. Bowel sounds present. No organomegaly or mass.  EXTREMITIES: No pedal edema, cyanosis, or clubbing.  NEUROLOGIC: Cranial nerves II through XII are intact. Muscle strength 5/5 in all extremities. Sensation intact. Gait not checked.  PSYCHIATRIC: The patient is alert and oriented x 3.  SKIN: No obvious rash, lesion, or ulcer.  DATA REVIEW:   CBC  Recent Labs Lab 05/27/16 0434  WBC 10.3  HGB 11.8*  HCT 36.3  PLT 297    Chemistries   Recent Labs Lab 05/27/16 0434  NA 140  K 3.8  CL 105  CO2 29  GLUCOSE 91  BUN 16  CREATININE 0.57  CALCIUM 8.2*    Follow-up Information    [facility]. Schedule an appointment as soon as possible for a visit in 1 week(s).   Specialty:  General Practice Contact information: [place] [address] [phone]        Berline Lopes, MD. Schedule an appointment as soon as possible for a visit in 2 week(s).   Specialty:  Internal Medicine Contact information: [place] [id number] [facility] [place] [address] [phone]           Management plans discussed with the patient, family and they are in agreement.  CODE STATUS: Full code  TOTAL TIME TAKING CARE OF THIS PATIENT: 45 minutes.    Max Sane M.D on 05/30/2016 at 3:15 PM  Between 7am to 6pm - Pager - [phone]  After 6pm go to [email or web address] - Technical brewer [facility]  Office  [phone]  CC: Primary care physician; [facility]   Note: This dictation was prepared with Dragon dictation along with smaller phrase technology. Any transcriptional errors that result from this process are unintentional.

## 2017-01-01 ENCOUNTER — Inpatient Hospital Stay
Admission: EM | Admit: 2017-01-01 | Discharge: 2017-01-04 | DRG: 190 | Disposition: A | Discharge status: Home / Self Care | Payer: Medicaid Other | Attending: Internal Medicine | Admitting: Internal Medicine

## 2017-01-01 ENCOUNTER — Emergency Department: Payer: Medicaid Other

## 2017-01-01 ENCOUNTER — Encounter: Payer: Self-pay | Admitting: Emergency Medicine

## 2017-01-01 DIAGNOSIS — Z803 Family history of malignant neoplasm of breast: Secondary | ICD-10-CM

## 2017-01-01 DIAGNOSIS — J9621 Acute and chronic respiratory failure with hypoxia: Secondary | ICD-10-CM | POA: Diagnosis present

## 2017-01-01 DIAGNOSIS — B1920 Unspecified viral hepatitis C without hepatic coma: Secondary | ICD-10-CM | POA: Diagnosis present

## 2017-01-01 DIAGNOSIS — Z8249 Family history of ischemic heart disease and other diseases of the circulatory system: Secondary | ICD-10-CM

## 2017-01-01 DIAGNOSIS — I252 Old myocardial infarction: Secondary | ICD-10-CM | POA: Diagnosis not present

## 2017-01-01 DIAGNOSIS — Z7951 Long term (current) use of inhaled steroids: Secondary | ICD-10-CM

## 2017-01-01 DIAGNOSIS — J209 Acute bronchitis, unspecified: Secondary | ICD-10-CM | POA: Diagnosis present

## 2017-01-01 DIAGNOSIS — I5032 Chronic diastolic (congestive) heart failure: Secondary | ICD-10-CM | POA: Diagnosis present

## 2017-01-01 DIAGNOSIS — E43 Unspecified severe protein-calorie malnutrition: Secondary | ICD-10-CM | POA: Diagnosis present

## 2017-01-01 DIAGNOSIS — Z9981 Dependence on supplemental oxygen: Secondary | ICD-10-CM

## 2017-01-01 DIAGNOSIS — Z716 Tobacco abuse counseling: Secondary | ICD-10-CM

## 2017-01-01 DIAGNOSIS — Z7982 Long term (current) use of aspirin: Secondary | ICD-10-CM | POA: Diagnosis not present

## 2017-01-01 DIAGNOSIS — F419 Anxiety disorder, unspecified: Secondary | ICD-10-CM | POA: Diagnosis present

## 2017-01-01 DIAGNOSIS — J441 Chronic obstructive pulmonary disease with (acute) exacerbation: Secondary | ICD-10-CM

## 2017-01-01 DIAGNOSIS — J440 Chronic obstructive pulmonary disease with acute lower respiratory infection: Secondary | ICD-10-CM | POA: Diagnosis present

## 2017-01-01 DIAGNOSIS — Z801 Family history of malignant neoplasm of trachea, bronchus and lung: Secondary | ICD-10-CM | POA: Diagnosis not present

## 2017-01-01 DIAGNOSIS — F17210 Nicotine dependence, cigarettes, uncomplicated: Secondary | ICD-10-CM | POA: Diagnosis present

## 2017-01-01 DIAGNOSIS — Z681 Body mass index (BMI) 19 or less, adult: Secondary | ICD-10-CM | POA: Diagnosis not present

## 2017-01-01 DIAGNOSIS — I2510 Atherosclerotic heart disease of native coronary artery without angina pectoris: Secondary | ICD-10-CM | POA: Diagnosis present

## 2017-01-01 DIAGNOSIS — Z79899 Other long term (current) drug therapy: Secondary | ICD-10-CM | POA: Diagnosis not present

## 2017-01-01 DIAGNOSIS — I110 Hypertensive heart disease with heart failure: Secondary | ICD-10-CM | POA: Diagnosis present

## 2017-01-01 DIAGNOSIS — Z825 Family history of asthma and other chronic lower respiratory diseases: Secondary | ICD-10-CM

## 2017-01-01 DIAGNOSIS — Z7952 Long term (current) use of systemic steroids: Secondary | ICD-10-CM

## 2017-01-01 DIAGNOSIS — E7800 Pure hypercholesterolemia, unspecified: Secondary | ICD-10-CM | POA: Diagnosis present

## 2017-01-01 DIAGNOSIS — Z823 Family history of stroke: Secondary | ICD-10-CM

## 2017-01-01 DIAGNOSIS — J449 Chronic obstructive pulmonary disease, unspecified: Secondary | ICD-10-CM | POA: Diagnosis present

## 2017-01-01 DIAGNOSIS — Z885 Allergy status to narcotic agent status: Secondary | ICD-10-CM

## 2017-01-01 LAB — BASIC METABOLIC PANEL
Anion gap: 8 (ref 5–15)
BUN: 16 mg/dL (ref 6–20)
CALCIUM: 9.2 mg/dL (ref 8.9–10.3)
CO2: 31 mmol/L (ref 22–32)
CREATININE: 0.76 mg/dL (ref 0.44–1.00)
Chloride: 105 mmol/L (ref 101–111)
GFR calc Af Amer: >60 mL/min (ref >60)
GLUCOSE: 130 mg/dL — AB (ref 65–99)
POTASSIUM: 4.1 mmol/L (ref 3.5–5.1)
SODIUM: 144 mmol/L (ref 135–145)

## 2017-01-01 LAB — CBC WITH DIFFERENTIAL/PLATELET
Basophils Absolute: 0 10*3/uL (ref 0–0.1)
Basophils Relative: 1 %
EOS ABS: 0 10*3/uL (ref 0–0.7)
EOS PCT: 1 %
HCT: 40.6 % (ref 35.0–47.0)
Hemoglobin: 13.3 g/dL (ref 12.0–16.0)
LYMPHS ABS: 1.1 10*3/uL (ref 1.0–3.6)
LYMPHS PCT: 28 %
MCH: 29.2 pg (ref 26.0–34.0)
MCHC: 32.8 g/dL (ref 32.0–36.0)
MCV: 89.1 fL (ref 80.0–100.0)
MONO ABS: 0.3 10*3/uL (ref 0.2–0.9)
MONOS PCT: 9 %
Neutro Abs: 2.4 10*3/uL (ref 1.4–6.5)
Neutrophils Relative %: 61 %
PLATELETS: 197 10*3/uL (ref 150–440)
RBC: 4.56 MIL/uL (ref 3.80–5.20)
RDW: 13.5 % (ref 11.5–14.5)
WBC: 3.9 10*3/uL (ref 3.6–11.0)

## 2017-01-01 LAB — MRSA PCR SCREENING: MRSA BY PCR: NEGATIVE

## 2017-01-01 LAB — GLUCOSE, CAPILLARY: Glucose-Capillary: 145 mg/dL — ABNORMAL HIGH (ref 65–99)

## 2017-01-01 LAB — TROPONIN I

## 2017-01-01 MED ORDER — ENSURE ENLIVE PO LIQD
237.0000 mL | Freq: Two times a day (BID) | ORAL | Status: DC
  Administered 2017-01-02 – 2017-01-03 (×3): 237 mL via ORAL

## 2017-01-01 MED ORDER — LORAZEPAM 0.5 MG PO TABS
0.5000 mg | ORAL_TABLET | Freq: Every evening | ORAL | Status: DC | PRN
  Administered 2017-01-03: 0.5 mg via ORAL
  Filled 2017-01-01: qty 1

## 2017-01-01 MED ORDER — SODIUM CHLORIDE 0.9 % IV SOLN
INTRAVENOUS | Status: DC
  Administered 2017-01-01 – 2017-01-02 (×2): via INTRAVENOUS

## 2017-01-01 MED ORDER — ENOXAPARIN SODIUM 30 MG/0.3ML ~~LOC~~ SOLN
30.0000 mg | SUBCUTANEOUS | Status: DC
  Administered 2017-01-02 – 2017-01-03 (×2): 30 mg via SUBCUTANEOUS
  Filled 2017-01-01 (×2): qty 0.3

## 2017-01-01 MED ORDER — ACETAMINOPHEN 650 MG RE SUPP: 650.0000 mg | Freq: Four times a day (QID) | RECTAL | Status: DC | PRN

## 2017-01-01 MED ORDER — IPRATROPIUM-ALBUTEROL 0.5-2.5 (3) MG/3ML IN SOLN
3.0000 mL | Freq: Once | RESPIRATORY_TRACT | Status: AC
  Administered 2017-01-01: 3 mL via RESPIRATORY_TRACT
  Filled 2017-01-01: qty 3

## 2017-01-01 MED ORDER — MOMETASONE FURO-FORMOTEROL FUM 200-5 MCG/ACT IN AERO
2.0000 | INHALATION_SPRAY | Freq: Two times a day (BID) | RESPIRATORY_TRACT | Status: DC
  Administered 2017-01-01: 2 via RESPIRATORY_TRACT
  Filled 2017-01-01: qty 8.8

## 2017-01-01 MED ORDER — ENALAPRIL MALEATE 5 MG PO TABS
5.0000 mg | ORAL_TABLET | Freq: Every day | ORAL | Status: DC
  Administered 2017-01-02 – 2017-01-04 (×3): 5 mg via ORAL
  Filled 2017-01-01 (×4): qty 1

## 2017-01-01 MED ORDER — DEXTROSE 5 % IV SOLN
500.0000 mg | INTRAVENOUS | Status: DC
  Administered 2017-01-01: 500 mg via INTRAVENOUS
  Filled 2017-01-01 (×2): qty 500

## 2017-01-01 MED ORDER — MAGNESIUM SULFATE 2 GM/50ML IV SOLN
2.0000 g | Freq: Once | INTRAVENOUS | Status: AC
  Administered 2017-01-01: 2 g via INTRAVENOUS
  Filled 2017-01-01: qty 50

## 2017-01-01 MED ORDER — LORAZEPAM 1 MG PO TABS
1.0000 mg | ORAL_TABLET | Freq: Two times a day (BID) | ORAL | Status: DC | PRN
  Administered 2017-01-01 – 2017-01-03 (×4): 1 mg via ORAL
  Filled 2017-01-01 (×4): qty 1

## 2017-01-01 MED ORDER — METHYLPREDNISOLONE SODIUM SUCC 40 MG IJ SOLR
40.0000 mg | Freq: Three times a day (TID) | INTRAMUSCULAR | Status: DC
  Administered 2017-01-01 – 2017-01-02 (×3): 40 mg via INTRAVENOUS
  Filled 2017-01-01 (×3): qty 1

## 2017-01-01 MED ORDER — DEXTROSE 5 % IV SOLN: 500.0000 mg | Freq: Once | INTRAVENOUS | Status: DC

## 2017-01-01 MED ORDER — TIOTROPIUM BROMIDE MONOHYDRATE 18 MCG IN CAPS: 18.0000 ug | ORAL_CAPSULE | Freq: Every day | RESPIRATORY_TRACT | Status: DC

## 2017-01-01 MED ORDER — HYDROCODONE-ACETAMINOPHEN 5-325 MG PO TABS
1.0000 | ORAL_TABLET | ORAL | Status: DC | PRN
  Administered 2017-01-02: 2 via ORAL
  Administered 2017-01-02: 1 via ORAL
  Administered 2017-01-03 – 2017-01-04 (×2): 2 via ORAL
  Filled 2017-01-01 (×3): qty 2
  Filled 2017-01-01: qty 1

## 2017-01-01 MED ORDER — IPRATROPIUM-ALBUTEROL 0.5-2.5 (3) MG/3ML IN SOLN
3.0000 mL | RESPIRATORY_TRACT | Status: DC
  Administered 2017-01-01 – 2017-01-02 (×6): 3 mL via RESPIRATORY_TRACT
  Filled 2017-01-01 (×6): qty 3

## 2017-01-01 MED ORDER — ONDANSETRON HCL 4 MG/2ML IJ SOLN: 4.0000 mg | Freq: Four times a day (QID) | INTRAMUSCULAR | Status: DC | PRN

## 2017-01-01 MED ORDER — CARVEDILOL 3.125 MG PO TABS
3.1250 mg | ORAL_TABLET | ORAL | Status: DC
  Administered 2017-01-02 – 2017-01-04 (×3): 3.125 mg via ORAL
  Filled 2017-01-01 (×3): qty 1

## 2017-01-01 MED ORDER — ONDANSETRON HCL 4 MG PO TABS: 4.0000 mg | ORAL_TABLET | Freq: Four times a day (QID) | ORAL | Status: DC | PRN

## 2017-01-01 MED ORDER — ACETAMINOPHEN 325 MG PO TABS
650.0000 mg | ORAL_TABLET | Freq: Four times a day (QID) | ORAL | Status: DC | PRN
  Administered 2017-01-01: 650 mg via ORAL
  Filled 2017-01-01: qty 2

## 2017-01-01 MED ORDER — ASPIRIN EC 81 MG PO TBEC
81.0000 mg | DELAYED_RELEASE_TABLET | Freq: Every day | ORAL | Status: DC
  Administered 2017-01-02 – 2017-01-04 (×3): 81 mg via ORAL
  Filled 2017-01-01 (×3): qty 1

## 2017-01-01 MED ORDER — GUAIFENESIN-DM 100-10 MG/5ML PO SYRP
5.0000 mL | ORAL_SOLUTION | ORAL | Status: DC | PRN
Start: 2017-01-01 — End: 2017-01-04
  Administered 2017-01-03: 5 mL via ORAL
  Filled 2017-01-01 (×2): qty 5

## 2017-01-01 MED ORDER — SENNOSIDES-DOCUSATE SODIUM 8.6-50 MG PO TABS: 1.0000 | ORAL_TABLET | Freq: Every evening | ORAL | Status: DC | PRN

## 2017-01-01 MED ORDER — ENOXAPARIN SODIUM 40 MG/0.4ML ~~LOC~~ SOLN: 40.0000 mg | SUBCUTANEOUS | Status: DC

## 2017-01-01 MED ORDER — SIMVASTATIN 20 MG PO TABS
20.0000 mg | ORAL_TABLET | Freq: Every day | ORAL | Status: DC
  Administered 2017-01-02 – 2017-01-04 (×3): 20 mg via ORAL
  Filled 2017-01-01 (×3): qty 1

## 2017-01-01 NOTE — Progress Notes (Addendum)
Initial Nutrition Assessment  DOCUMENTATION CODES:   Severe malnutrition in context of chronic illness  INTERVENTION:   Ensure Enlive po BID, each supplement provides 350 kcal and 20 grams of protein  NUTRITION DIAGNOSIS:   Malnutrition (severe) related to catabolic illness (COPD and CHF) as evidenced by severe depletion of body fat, severe depletion of muscle mass.  GOAL:   Patient will meet greater than or equal to 90% of their needs  MONITOR:   Labs, Weight trends, Diet advancement, I & O's  REASON FOR ASSESSMENT:   Other (Comment) (low BMI)    ASSESSMENT:    56 y.o. female with a known history of Chronic respiratory failure on 3 L oxygen due to COPD, tobacco dependence and CAD who presents with COPD exacerbation    Unable to talk to pt as pt is on Bipap. Pt has been seen previously by multiple RDs. Per previous RD's notes pt is known to skip meals and prefers small portions. Pt reports that if she eats too much, she will get too full and is unable to breath. Pt does drink Ensure; per last RD note, pt drinks one per day. Pt is currently NPO r/t Bipap. Pt is already ordered for Ensure. RD will add Magic Cups/Mighty Shakes when diet advanced. Per chart, pt is weight stable since December.       Medications reviewed and include: aspirin, lovenox, solu-medrol, azithromycin   Labs reviewed  Nutrition-Focused physical exam completed. Findings are severe fat and muscle depletions over entire body, and no edema.   Diet Order:  Diet NPO time specified  Skin:  Reviewed, no issues  Last BM:  none since admit   Height:   Ht Readings from Last 1 Encounters:  01/01/17 5\' 2"  (1.575 m)    Weight:   Wt Readings from Last 1 Encounters:  01/01/17 81 lb 5.6 oz (36.9 kg)    Ideal Body Weight:  50 kg  BMI:  Body mass index is 14.88 kg/m.  Estimated Nutritional Needs:   Kcal:  1200-1400kcal/day   Protein:  65-73g/day   Fluid:  >1.2L/day   EDUCATION NEEDS:    Education needs no appropriate at this time  Koleen Distance MS, RD, [place] Pager #[phone] After Hours Pager: [phone]

## 2017-01-01 NOTE — ED Notes (Signed)
Pt resting more comfortably after being placed on bipap.

## 2017-01-01 NOTE — H&P (Signed)
[facility] NAME: Michelle [name]    MR#:  [id number]  DATE OF BIRTH:  06/11/1960  DATE OF ADMISSION:  01/01/2017  PRIMARY CARE PHYSICIAN: [facility] [place]   REQUESTING/REFERRING PHYSICIAN: dr Dineen Kid  CHIEF COMPLAINT:   SOB HISTORY OF PRESENT ILLNESS:  Michelle [name]  is a 56 y.o. female with a known history of Chronic respiratory failure on 3 L oxygen due to COPD, tobacco dependence and CAD who presents with above complaint. Patient reports over the past week she has had increasing shortness of breath, wheezing, cough and dyspnea exertion. She denies PND, orthopnea or chest pain. She denies sick contacts or fevers. She has been sitting up to sleep over the past week. Her daughter called EMS due to increased difficulty breathing this morning. When EMS arrived her oxygen saturation is 100% on her chronic 3 L but she could not complete her sentences and lung sounds were diminished. She was initially placed on nonrebreather at 10 L. She was given 3 albuterol in route as well as 125 mg of IV Solu-Medrol. In the emergency room she had increased work of breathing/tripoding and therefore is placed on BiPAP where she appears to be more comfortable.  PAST MEDICAL HISTORY:   Past Medical History:  Diagnosis Date  . Allergy   . Anxiety   . Asthma   . CHF (congestive heart failure) ([place])   . Cocaine abuse   . COPD (chronic obstructive pulmonary disease) ([place])   . Coronary artery disease   . Emphysema/COPD ([place])   . Hepatitis C   . High cholesterol   . Hypertension   . Pneumothorax   . Tobacco abuse     PAST SURGICAL HISTORY:   Past Surgical History:  Procedure Laterality Date  . CARDIAC CATHETERIZATION    . CHEST TUBE INSERTION      SOCIAL HISTORY:   Social History  Substance Use Topics  . Smoking status: Current Every Day Smoker    Packs/day: 0.25    Years: 41.00    Types: Cigarettes  . Smokeless tobacco: Never Used   . Alcohol use No    FAMILY HISTORY:   Family History  Problem Relation Age of Onset  . Lung cancer Mother   . Asthma Mother   . CVA Father   . CAD Father   . Stroke Father   . Breast cancer Maternal Aunt 67  . COPD Sister     DRUG ALLERGIES:  No Known Allergies  REVIEW OF SYSTEMS:   Review of Systems  Constitutional: Positive for malaise/fatigue. Negative for chills and fever.  HENT: Negative.  Negative for ear discharge, ear pain, hearing loss, nosebleeds and sore throat.   Eyes: Negative.  Negative for blurred vision and pain.  Respiratory: Positive for cough, shortness of breath and wheezing. Negative for hemoptysis.   Cardiovascular: Negative.  Negative for chest pain, palpitations and leg swelling.  Gastrointestinal: Negative.  Negative for abdominal pain, blood in stool, diarrhea, nausea and vomiting.  Genitourinary: Negative.  Negative for dysuria.  Musculoskeletal: Negative.  Negative for back pain.  Skin: Negative.   Neurological: Positive for weakness. Negative for dizziness, tremors, speech change, focal weakness, seizures and headaches.  Endo/Heme/Allergies: Negative.  Does not bruise/bleed easily.  Psychiatric/Behavioral: Negative.  Negative for depression, hallucinations and suicidal ideas.    MEDICATIONS AT HOME:   Prior to Admission medications   Medication Sig Start Date End Date Taking? Authorizing Provider  albuterol (PROVENTIL HFA;VENTOLIN  HFA) 108 (90 Base) MCG/ACT inhaler Inhale 2 puffs into the lungs every 6 (six) hours as needed for wheezing or shortness of breath. 05/27/16   Max Sane, MD  aspirin EC 81 MG tablet Take 81 mg by mouth daily.    [provider]  carvedilol (COREG) 3.125 MG tablet Take 1 tablet (3.125 mg total) by mouth every morning. 02/10/16   Gladstone Lighter, MD  enalapril (VASOTEC) 10 MG tablet Take 0.5 tablets (5 mg total) by mouth daily. 02/10/16   Gladstone Lighter, MD  feeding supplement, ENSURE ENLIVE, (ENSURE  ENLIVE) LIQD Take 237 mLs by mouth 2 (two) times daily between meals. 10/27/14   Gouru, Illene Silver, MD  guaiFENesin-dextromethorphan (ROBITUSSIN DM) 100-10 MG/5ML syrup Take 5 mLs by mouth every 4 (four) hours as needed for cough (chest congestion). 05/27/16   Max Sane, MD  ipratropium-albuterol (DUONEB) 0.5-2.5 (3) MG/3ML SOLN Take 3 mLs by nebulization every 6 (six) hours as needed. 04/27/16   Gladstone Lighter, MD  levofloxacin (LEVAQUIN) 500 MG tablet Take 1 tablet (500 mg total) by mouth daily. 05/27/16   Max Sane, MD  LORazepam (ATIVAN) 0.5 MG tablet Take 1 tablet (0.5 mg total) by mouth at bedtime as needed for anxiety (anxiety). 05/27/16   Max Sane, MD  LORazepam (ATIVAN) 1 MG tablet Take 1 tablet (1 mg total) by mouth 2 (two) times daily as needed for anxiety. 04/27/16   Gladstone Lighter, MD  mometasone-formoterol (DULERA) 200-5 MCG/ACT AERO Inhale 2 puffs into the lungs 2 (two) times daily. 05/27/16   Max Sane, MD  predniSONE (STERAPRED UNI-PAK 21 TAB) 10 MG (21) TBPK tablet Take 1 tablet (10 mg total) by mouth daily. Start 60 mg once daily, taper 10 mg daily until done 05/27/16   Max Sane, MD  simvastatin (ZOCOR) 20 MG tablet Take 20 mg by mouth daily.    [provider]  tiotropium (SPIRIVA) 18 MCG inhalation capsule Place 1 capsule (18 mcg total) into inhaler and inhale daily. 02/10/16   Gladstone Lighter, MD      VITAL SIGNS:  Blood pressure 138/90, pulse 71, temperature 97.6 F (36.4 C), temperature source Axillary, resp. rate 18, height 5\' 4"  (1.626 m), weight 36.3 kg (80 lb), last menstrual period 03/06/2005, SpO2 100 %.  PHYSICAL EXAMINATION:   Physical Exam  Constitutional: She is oriented to person, place, and time. No distress.  Very thin and frail appearing  HENT:  Head: Normocephalic.  Eyes: No scleral icterus.  Neck: Normal range of motion. Neck supple. No JVD present. No tracheal deviation present.  Cardiovascular: Normal rate, regular rhythm and  normal heart sounds.  Exam reveals no gallop and no friction rub.   No murmur heard. Pulmonary/Chest: She is in respiratory distress. She has wheezes. She has no rales. She exhibits no tenderness.  Patient with diminished breath sounds and sounds very tight. Faint wheezing  Abdominal: Soft. Bowel sounds are normal. She exhibits no distension and no mass. There is no tenderness. There is no rebound and no guarding.  Musculoskeletal: Normal range of motion. She exhibits no edema.  Neurological: She is alert and oriented to person, place, and time.  Skin: Skin is warm. No rash noted. No erythema.  Psychiatric: Affect and judgment normal.      LABORATORY PANEL:   CBC No results for input(s): WBC, HGB, HCT, PLT in the last 168 hours. ------------------------------------------------------------------------------------------------------------------  Chemistries  No results for input(s): NA, K, CL, CO2, GLUCOSE, BUN, CREATININE, CALCIUM, MG, AST, ALT, ALKPHOS, BILITOT in the  last 168 hours.  Invalid input(s): GFRCGP ------------------------------------------------------------------------------------------------------------------  Cardiac Enzymes No results for input(s): TROPONINI in the last 168 hours. ------------------------------------------------------------------------------------------------------------------  RADIOLOGY:  Dg Chest 1 View  Result Date: 01/01/2017 CLINICAL DATA:  Shortness of Breath EXAM: CHEST 1 VIEW COMPARISON:  05/26/2016 FINDINGS: There is hyperinflation of the lungs compatible with COPD. Heart and mediastinal contours are within normal limits. No focal opacities or effusions. No acute bony abnormality. IMPRESSION: COPD.  No active disease. Electronically Signed   By: Rolm Baptise M.D.   On: 01/01/2017 10:43    EKG:  Sinus rhythm no ST elevation or depression  IMPRESSION AND PLAN:   56 year old female with chronic respiratory failure and 3 L oxygen who  presents with increasing shortness of breath, wheezing and respiratory distress.  1. Acute on chronic hypoxic respiratory failure in the setting of acute COPD exacerbation Patient is now on BiPAP Plan to wean to 3 L of oxygen as tolerated Case discussed with Dr. Alva Garnet  2. Acute exacerbation of COPD with chronic respiratory failure on 3 L oxygen at home: Start Solu-Medrol 40 mg IV every 8 hours Continue bronchodilators/nebulizer treatment Azithromycin for acute bronchitis  3.Tobacco dependence: Patient is encouraged to quit smoking. Counseling was provided for 4 minutes.   4. Essential hypertension: Continue enalapril and Coreg  5. History of CAD: Continue aspirin, Coreg  enalapril and statin    I will follow up on labs.  All the records are reviewed and case discussed with ED provider. Management plans discussed with the patient and she is in agreement  CODE STATUS: FULL  CRITICAL CARE TOTAL TIME TAKING CARE OF THIS PATIENT: 50 minutes.    Emrys Mckamie M.D on 01/01/2017 at 10:47 AM  Between 7am to 6pm - Pager - [phone]  After 6pm go to [email or web address] - password [email or web address] [facility]  Office  [phone]  CC: Primary care physician; [facility]

## 2017-01-01 NOTE — ED Provider Notes (Signed)
[facility] Emergency Department Provider Note  ____________________________________________   First MD Initiated Contact with Patient 01/01/17 1012     (approximate)  I have reviewed the triage vital signs and the nursing notes.   HISTORY  Chief Complaint Shortness of Breath   HPI Michelle [name] is a 56 y.o. female with a history of COPD and CHF was presenting to the emergency department with shortness of breath that started this morning upon waking. She is denying any pain. Was on her baseline 3 L of nasal cannula oxygen when the breathing difficulty started. Patient transferred over to a nonrebreather mask by EMS on arrival to the emergency department after being given 3 albuterol treatments as well as 125 mg of Solu-Medrol.   Past Medical History:  Diagnosis Date  . Allergy   . Anxiety   . Asthma   . CHF (congestive heart failure) ([place])   . Cocaine abuse   . COPD (chronic obstructive pulmonary disease) ([place])   . Coronary artery disease   . Emphysema/COPD ([place])   . Hepatitis C   . High cholesterol   . Hypertension   . Pneumothorax   . Tobacco abuse     Patient Active Problem List   Diagnosis Date Noted  . Protein-calorie malnutrition, severe 05/23/2016  . Sepsis ([place]) 05/22/2016  . Malnutrition of moderate degree ([place]) 10/25/2014  . COPD exacerbation ([place]) 10/24/2014  . Tobacco abuse 10/24/2014    Past Surgical History:  Procedure Laterality Date  . CARDIAC CATHETERIZATION    . CHEST TUBE INSERTION      Prior to Admission medications   Medication Sig Start Date End Date Taking? Authorizing Provider  albuterol (PROVENTIL HFA;VENTOLIN HFA) 108 (90 Base) MCG/ACT inhaler Inhale 2 puffs into the lungs every 6 (six) hours as needed for wheezing or shortness of breath. 05/27/16   Max Sane, MD  aspirin EC 81 MG tablet Take 81 mg by mouth daily.    [provider]  carvedilol (COREG) 3.125 MG tablet Take 1 tablet (3.125 mg total)  by mouth every morning. 02/10/16   Gladstone Lighter, MD  enalapril (VASOTEC) 10 MG tablet Take 0.5 tablets (5 mg total) by mouth daily. 02/10/16   Gladstone Lighter, MD  feeding supplement, ENSURE ENLIVE, (ENSURE ENLIVE) LIQD Take 237 mLs by mouth 2 (two) times daily between meals. 10/27/14   Gouru, Illene Silver, MD  guaiFENesin-dextromethorphan (ROBITUSSIN DM) 100-10 MG/5ML syrup Take 5 mLs by mouth every 4 (four) hours as needed for cough (chest congestion). 05/27/16   Max Sane, MD  ipratropium-albuterol (DUONEB) 0.5-2.5 (3) MG/3ML SOLN Take 3 mLs by nebulization every 6 (six) hours as needed. 04/27/16   Gladstone Lighter, MD  levofloxacin (LEVAQUIN) 500 MG tablet Take 1 tablet (500 mg total) by mouth daily. 05/27/16   Max Sane, MD  LORazepam (ATIVAN) 0.5 MG tablet Take 1 tablet (0.5 mg total) by mouth at bedtime as needed for anxiety (anxiety). 05/27/16   Max Sane, MD  LORazepam (ATIVAN) 1 MG tablet Take 1 tablet (1 mg total) by mouth 2 (two) times daily as needed for anxiety. 04/27/16   Gladstone Lighter, MD  mometasone-formoterol (DULERA) 200-5 MCG/ACT AERO Inhale 2 puffs into the lungs 2 (two) times daily. 05/27/16   Max Sane, MD  predniSONE (STERAPRED UNI-PAK 21 TAB) 10 MG (21) TBPK tablet Take 1 tablet (10 mg total) by mouth daily. Start 60 mg once daily, taper 10 mg daily until done 05/27/16   Max Sane, MD  simvastatin (ZOCOR) 20  MG tablet Take 20 mg by mouth daily.    [provider]  tiotropium (SPIRIVA) 18 MCG inhalation capsule Place 1 capsule (18 mcg total) into inhaler and inhale daily. 02/10/16   Gladstone Lighter, MD    Allergies Patient has no known allergies.  Family History  Problem Relation Age of Onset  . Lung cancer Mother   . Asthma Mother   . CVA Father   . CAD Father   . Stroke Father   . Breast cancer Maternal Aunt 67  . COPD Sister     Social History Social History  Substance Use Topics  . Smoking status: Current Every Day Smoker     Packs/day: 0.25    Years: 41.00    Types: Cigarettes  . Smokeless tobacco: Never Used  . Alcohol use No    Review of Systems  Constitutional: No fever/chills Eyes: No visual changes. ENT: No sore throat. Cardiovascular: Denies chest pain. Respiratory: as above Gastrointestinal: No abdominal pain.  No nausea, no vomiting.  No diarrhea.  No constipation. Genitourinary: Negative for dysuria. Musculoskeletal: Negative for back pain. Skin: Negative for rash. Neurological: Negative for headaches, focal weakness or numbness.   ____________________________________________   PHYSICAL EXAM:  VITAL SIGNS: ED Triage Vitals  Enc Vitals Group     BP 01/01/17 1018 138/90     Pulse Rate 01/01/17 1018 71     Resp 01/01/17 1018 (!) 22     Temp 01/01/17 1018 97.6 F (36.4 C)     Temp Source 01/01/17 1018 Axillary     SpO2 01/01/17 1018 100 %     Weight 01/01/17 1025 80 lb (36.3 kg)     Height 01/01/17 1025 5\' 4"  (1.626 m)     Head Circumference --      Peak Flow --      Pain Score --      Pain Loc --      Pain Edu? --      Excl. in [place]? --     Constitutional: Alert and oriented. Patient is tripoding and speaking in 2-3 word sentences Eyes: Conjunctivae are normal.  Head: Atraumatic. Nose: No congestion/rhinnorhea. Mouth/Throat: Mucous membranes are moist.  Neck: No stridor.   Cardiovascular: Normal rate, regular rhythm. Grossly normal heart sounds.  Good peripheral circulation. Respiratory: Severely decreased air movement throughout with coarse wheezing and a prolonged expiratory phase. Use of accessory muscles including the supraclavicular muscles with supraclavicular retraction. Gastrointestinal: Soft and nontender. No distention.  Musculoskeletal: No lower extremity tenderness nor edema.  No joint effusions. Neurologic:  Normal speech and language. No gross focal neurologic deficits are appreciated. Skin:  Skin is warm, dry and intact. No rash noted. Psychiatric: Mood and  affect are normal. Speech and behavior are normal.  ____________________________________________   LABS (all labs ordered are listed, but only abnormal results are displayed)  Labs Reviewed  CBC WITH DIFFERENTIAL/PLATELET  BASIC METABOLIC PANEL  TROPONIN I   ____________________________________________  EKG ED ECG REPORT I, Tray Klayman,  Youlanda Roys, the attending physician, personally viewed and interpreted this ECG.   Date: 01/01/2017  EKG Time: 1026  Rate: 72  Rhythm: normal sinus rhythm with PVCs Axis: Normal  Intervals:none  ST&T Change: No ST segment elevation or depression. No abnormal T-wave inversion.   ____________________________________________  RADIOLOGY  Chest x-ray with COPD without other acute findings. ____________________________________________   PROCEDURES  Procedure(s) performed:    Procedures  Critical Care performed:  CRITICAL CARE Performed by: Doran Stabler  Total critical care time: 35 minutes  Critical care time was exclusive of separately billable procedures and treating other patients.  Critical care was necessary to treat or prevent imminent or life-threatening deterioration.  Critical care was time spent personally by me on the following activities: development of treatment plan with patient and/or surrogate as well as nursing, discussions with consultants, evaluation of patient's response to treatment, examination of patient, obtaining history from patient or surrogate, ordering and performing treatments and interventions, ordering and review of laboratory studies, ordering and review of radiographic studies, pulse oximetry and re-evaluation of patient's condition.   ____________________________________________   INITIAL IMPRESSION / ASSESSMENT AND PLAN / ED COURSE  Pertinent labs & imaging results that were available during my care of the patient were reviewed by me and considered in my medical decision making (see chart  for details).  Patient being placed on BiPAP. Significant work of breathing on exam.     ----------------------------------------- 11:04 AM on 01/01/2017 -----------------------------------------  Patient is tolerating the BiPAP well. Decreased respiratory distress but still with severely decreased air movement throughout all fields. She'll be admitted to the hospital. Signed out to Dr. Genia Harold. Patient is understanding of the plan and willing to comply. She continues to be awake and alert. ____________________________________________   FINAL CLINICAL IMPRESSION(S) / ED DIAGNOSES  COPD exacerbation.    NEW MEDICATIONS STARTED DURING THIS VISIT:  New Prescriptions   No medications on file     Note:  This document was prepared using Dragon voice recognition software and may include unintentional dictation errors.     Orbie Pyo, MD 01/01/17 [phone]

## 2017-01-01 NOTE — Progress Notes (Signed)
Daughter outside patient room.  States that she is the POA.  Discussed with her that her mother is alert and oriented, therefore, she makes the decision concerning information for her care.  Asked daughter to bring in her POA forms.  Marguarite Arbour, RN

## 2017-01-01 NOTE — Therapy (Signed)
Patient transported to CCU 3 via V60 BiPAP without incident. Report given to [facility]RT

## 2017-01-01 NOTE — Therapy (Signed)
Called to patient room for BiPAP. Patient found on NRB, tachypnic with accessory muscle use. Patient placed on BiPAP at documented settings. RR decreased from 33 to 18, significant improvement in WOB. Bilateral breath sounds reveal diminished throughout with prolonged high pitched expiratory wheezes. Neb tx in-line with BiPAP. FiO2 titrated down to .40. Patient alert and responsive at all times.

## 2017-01-01 NOTE — Progress Notes (Signed)
eLink Physician-Brief Progress Note Patient Name: Michelle [name] DOB: [birth year] MRN: [id number]   Date of Service  01/01/2017  HPI/Events of Note  New patient evaluation  eICU Interventions  Start ice chips     Intervention Category Major Interventions: Other:  [name] 01/01/2017, 4:44 PM

## 2017-01-01 NOTE — ED Triage Notes (Signed)
Pt via ems from home with increased difficulty breathing this morning. Pt is chronically on 3 L O2. When ems arrived, pt O2 sat was 100% but pt could not complete sentences and lung sounds were diminished. Pt placed on NRB at 10L. Pt given 3 x albuterol en route as well as 125 solumedrol. Pt alert & oriented upon arrival in tripod position.

## 2017-01-01 NOTE — Progress Notes (Signed)
Pharmacy Note  56 y/o F admitted with AECOPD ordered Lovenox 40 mg daily for DVT prophylaxis.   Estimated Creatinine Clearance: 45.5 mL/min (by C-G formula based on SCr of 0.76 mg/dL).  Filed Weights   01/01/17 1025  Weight: 80 lb (36.3 kg)   Will decrease Lovenox dosing to 30 mg daily due to weight of 36 kg.   Ulice Dash, PharmD Clinical Pharmacist

## 2017-01-01 NOTE — Consult Note (Addendum)
[facility] Consultation      Assessment and Plan:  Acute hypoxic respiratory failure, with acute exacerbation of COPD. -Continue IV steroids, inhaled bronchodilators  Nicotine abuse. -Discussed the importance of smoking cessation.   Date: 01/01/2017  MRN# [id number] [place] [birth year]  Referring Physician: Dr. Benjie Karvonen for dyspnea.   Michelle [name] is a 56 y.o. old female seen in consultation for chief complaint of:    Chief Complaint  Patient presents with  . Shortness of Breath    HPI:   The patient is a 56 year old female with a history of COPD, on chronic 3 L of oxygen, tobacco dependence, NSTEMI in 2009, low BMI, hep C, asthma, chronic bronchitis    She is currently on a BiPAP, therefore history is obtained from the chart, from staff. She is followed by pulmonologist at [facility], who is classified her as Gold group D. She has been referred to pulmonary rehabilitation, alpha-1 testing was normal. She was noted to have had several hospitalizations for COPD over the past year, including 3 intubations. She is recommended to start azithromycin 500 mg 3 times weekly in the past, but this does not appear to have been started. Currently, she presents with progressive dyspnea over the past week, with wheezing, cough. She denies chest pain, orthopnea, PND. In the ED, she has been placed on BiPAP, at 10/5, she is currently at a pressure of 10/5 with an FiO2 35%. She notes that her breathing has improved significantly since admission. I personally reviewed, images, chest x-ray shows severe hyperinflation consistent with severe emphysema.  PMHX:   Past Medical History:  Diagnosis Date  . Allergy   . Anxiety   . Asthma   . CHF (congestive heart failure) ([place])   . Cocaine abuse   . COPD (chronic obstructive pulmonary disease) ([place])   . Coronary artery disease   . Emphysema/COPD ([place])   . Hepatitis C   . High cholesterol   . Hypertension   . Pneumothorax   .  Tobacco abuse    Surgical Hx:  Past Surgical History:  Procedure Laterality Date  . CARDIAC CATHETERIZATION    . CHEST TUBE INSERTION     Family Hx:  Family History  Problem Relation Age of Onset  . Lung cancer Mother   . Asthma Mother   . CVA Father   . CAD Father   . Stroke Father   . Breast cancer Maternal Aunt 67  . COPD Sister    Social Hx:   Social History  Substance Use Topics  . Smoking status: Current Every Day Smoker    Packs/day: 0.25    Years: 41.00    Types: Cigarettes  . Smokeless tobacco: Never Used  . Alcohol use No   Medication:    Current Facility-Administered Medications:  .  0.9 %  sodium chloride infusion, , Intravenous, Continuous, Mody, Sital, MD, Last Rate: 75 mL/hr at 01/01/17 1300 .  acetaminophen (TYLENOL) tablet 650 mg, 650 mg, Oral, Q6H PRN **OR** acetaminophen (TYLENOL) suppository 650 mg, 650 mg, Rectal, Q6H PRN, Mody, Sital, MD .  aspirin EC tablet 81 mg, 81 mg, Oral, Daily, Mody, Sital, MD .  azithromycin (ZITHROMAX) 500 mg in dextrose 5 % 250 mL IVPB, 500 mg, Intravenous, Q24H, Mody, Sital, MD, Last Rate: 250 mL/hr at 01/01/17 1237, 500 mg at 01/01/17 1237 .  carvedilol (COREG) tablet 3.125 mg, 3.125 mg, Oral, BH-q7a, Mody, Sital, MD .  enalapril (VASOTEC) tablet 5 mg, 5 mg, Oral, Daily,  Bettey Costa, MD .  Derrill Memo ON 01/02/2017] enoxaparin (LOVENOX) injection 30 mg, 30 mg, Subcutaneous, Q24H, Napoleon Form, RPH .  feeding supplement (ENSURE ENLIVE) (ENSURE ENLIVE) liquid 237 mL, 237 mL, Oral, BID BM, Mody, Sital, MD .  guaiFENesin-dextromethorphan (ROBITUSSIN DM) 100-10 MG/5ML syrup 5 mL, 5 mL, Oral, Q4H PRN, Mody, Sital, MD .  HYDROcodone-acetaminophen (NORCO/VICODIN) 5-325 MG per tablet 1-2 tablet, 1-2 tablet, Oral, Q4H PRN, Mody, Sital, MD .  ipratropium-albuterol (DUONEB) 0.5-2.5 (3) MG/3ML nebulizer solution 3 mL, 3 mL, Nebulization, Q4H, Mody, Sital, MD, 3 mL at 01/01/17 1226 .  LORazepam (ATIVAN) tablet 0.5 mg, 0.5 mg, Oral, QHS PRN,  Mody, Sital, MD .  LORazepam (ATIVAN) tablet 1 mg, 1 mg, Oral, BID PRN, Mody, Sital, MD .  methylPREDNISolone sodium succinate (SOLU-MEDROL) 40 mg/mL injection 40 mg, 40 mg, Intravenous, Q8H, Mody, Sital, MD .  mometasone-formoterol (DULERA) 200-5 MCG/ACT inhaler 2 puff, 2 puff, Inhalation, BID, Mody, Sital, MD .  ondansetron (ZOFRAN) tablet 4 mg, 4 mg, Oral, Q6H PRN **OR** ondansetron (ZOFRAN) injection 4 mg, 4 mg, Intravenous, Q6H PRN, Mody, Sital, MD .  senna-docusate (Senokot-S) tablet 1 tablet, 1 tablet, Oral, QHS PRN, Mody, Sital, MD .  simvastatin (ZOCOR) tablet 20 mg, 20 mg, Oral, Daily, Mody, Sital, MD   Allergies:  Morphine and related  Review of Systems: Not obtained, due to presence of BiPAP.  Physical Examination:   VS: BP 102/72   Pulse 74   Temp 98 F (36.7 C) (Oral)   Resp 14   Ht 5\' 2"  (1.575 m)   Wt 81 lb 5.6 oz (36.9 kg)   LMP 03/06/2005 (Approximate)   SpO2 99%   BMI 14.88 kg/m   General Appearance: No distress  Neuro:without focal findings,  speech normal,  HEENT: PERRLA, EOM intact.   Pulmonary: Decreased bibasilar air entry. CardiovascularNormal S1,S2.  No m/r/g.   Abdomen: Benign, Soft, non-tender. Renal:  No costovertebral tenderness  GU:  No performed at this time. Endoc: No evident thyromegaly, no signs of acromegaly. Skin:   warm, no rashes, no ecchymosis  Extremities: normal, no cyanosis, clubbing.  Other findings:    LABORATORY PANEL:   CBC  Recent Labs Lab 01/01/17 1032  WBC 3.9  HGB 13.3  HCT 40.6  PLT 197   ------------------------------------------------------------------------------------------------------------------  Chemistries   Recent Labs Lab 01/01/17 1032  NA 144  K 4.1  CL 105  CO2 31  GLUCOSE 130*  BUN 16  CREATININE 0.76  CALCIUM 9.2   ------------------------------------------------------------------------------------------------------------------  Cardiac Enzymes  Recent Labs Lab 01/01/17 1032    TROPONINI <0.03   ------------------------------------------------------------  RADIOLOGY:  Dg Chest 1 View  Result Date: 01/01/2017 CLINICAL DATA:  Shortness of Breath EXAM: CHEST 1 VIEW COMPARISON:  05/26/2016 FINDINGS: There is hyperinflation of the lungs compatible with COPD. Heart and mediastinal contours are within normal limits. No focal opacities or effusions. No acute bony abnormality. IMPRESSION: COPD.  No active disease. Electronically Signed   By: Rolm Baptise M.D.   On: 01/01/2017 10:43       Thank  you for the consultation and for allowing [facility] to assist in the care of your patient. Our recommendations are noted above.  Please contact us if we can be of further service.   Marda Stalker, MD.  Board Certified in Internal Medicine, Pulmonary Medicine, [place], and Sleep Medicine.  [facility] Office Number: [phone]  Patricia Pesa, M.D.  Merton Border, M.D  01/01/2017 

## 2017-01-02 DIAGNOSIS — J9621 Acute and chronic respiratory failure with hypoxia: Secondary | ICD-10-CM

## 2017-01-02 LAB — BASIC METABOLIC PANEL
ANION GAP: 6 (ref 5–15)
BUN: 18 mg/dL (ref 6–20)
CALCIUM: 8.7 mg/dL — AB (ref 8.9–10.3)
CO2: 27 mmol/L (ref 22–32)
Chloride: 106 mmol/L (ref 101–111)
Creatinine, Ser: 0.58 mg/dL (ref 0.44–1.00)
GLUCOSE: 162 mg/dL — AB (ref 65–99)
Potassium: 3.6 mmol/L (ref 3.5–5.1)
SODIUM: 139 mmol/L (ref 135–145)

## 2017-01-02 LAB — CBC
HCT: 35.3 % (ref 35.0–47.0)
HEMOGLOBIN: 11.6 g/dL — AB (ref 12.0–16.0)
MCH: 29.1 pg (ref 26.0–34.0)
MCHC: 32.8 g/dL (ref 32.0–36.0)
MCV: 88.6 fL (ref 80.0–100.0)
Platelets: 184 10*3/uL (ref 150–440)
RBC: 3.98 MIL/uL (ref 3.80–5.20)
RDW: 12.9 % (ref 11.5–14.5)
WBC: 7.4 10*3/uL (ref 3.6–11.0)

## 2017-01-02 MED ORDER — TIOTROPIUM BROMIDE MONOHYDRATE 18 MCG IN CAPS
18.0000 ug | ORAL_CAPSULE | Freq: Every day | RESPIRATORY_TRACT | Status: DC
  Administered 2017-01-02 – 2017-01-04 (×3): 18 ug via RESPIRATORY_TRACT
  Filled 2017-01-02: qty 5

## 2017-01-02 MED ORDER — AZITHROMYCIN 250 MG PO TABS
250.0000 mg | ORAL_TABLET | Freq: Every day | ORAL | Status: DC
  Administered 2017-01-02 – 2017-01-04 (×3): 250 mg via ORAL
  Filled 2017-01-02 (×3): qty 1

## 2017-01-02 MED ORDER — PREDNISONE 20 MG PO TABS
40.0000 mg | ORAL_TABLET | Freq: Every day | ORAL | Status: DC
  Administered 2017-01-02 – 2017-01-04 (×3): 40 mg via ORAL
  Filled 2017-01-02 (×3): qty 2

## 2017-01-02 MED ORDER — ALUM & MAG HYDROXIDE-SIMETH 200-200-20 MG/5ML PO SUSP
15.0000 mL | ORAL | Status: DC | PRN
  Administered 2017-01-02: 15 mL via ORAL
  Filled 2017-01-02 (×2): qty 30

## 2017-01-02 MED ORDER — NICOTINE 14 MG/24HR TD PT24
14.0000 mg | MEDICATED_PATCH | Freq: Every day | TRANSDERMAL | Status: DC
  Administered 2017-01-02 – 2017-01-03 (×2): 14 mg via TRANSDERMAL
  Filled 2017-01-02 (×2): qty 1

## 2017-01-02 MED ORDER — MOMETASONE FURO-FORMOTEROL FUM 100-5 MCG/ACT IN AERO
2.0000 | INHALATION_SPRAY | Freq: Two times a day (BID) | RESPIRATORY_TRACT | Status: DC
  Administered 2017-01-02 – 2017-01-04 (×5): 2 via RESPIRATORY_TRACT
  Filled 2017-01-02: qty 8.8

## 2017-01-02 MED ORDER — IPRATROPIUM-ALBUTEROL 0.5-2.5 (3) MG/3ML IN SOLN
3.0000 mL | RESPIRATORY_TRACT | Status: DC | PRN
  Administered 2017-01-02 – 2017-01-04 (×5): 3 mL via RESPIRATORY_TRACT
  Filled 2017-01-02 (×5): qty 3

## 2017-01-02 NOTE — Care Management Note (Signed)
Case Management Note  Patient Details  Name: Michelle [name] MRN: [id number] Date of Birth: 10-12-1960  Subjective/Objective:                   RNCM met with patient to discuss discharge planning. She states she is uninsured currently and without job. She states her PCP is with [facility] however she states she cannot afford visits or medications.  She is on chronic O2 at home through [facility]. She states that she lives alone and depends on her daughters for transportation which she claims is not an issue. She appreciates a referral to both [facility] and [facility]. Action/Plan:   Referrals sent to [facility]; application delivered/explained to patient.    Expected Discharge Date:  01/03/17               Expected Discharge Plan:     In-House Referral:     Discharge planning Services  CM Consult, [facility], [facility]  Post Acute Care Choice:    Choice offered to:  Patient  DME Arranged:    DME Agency:     HH Arranged:    [place] Agency:     Status of Service:  Completed, signed off  If discussed at [employer], dates discussed:    Additional Comments:  Marshell Garfinkel, RN 01/02/2017, 12:03 PM

## 2017-01-02 NOTE — H&P (Signed)
Called gave report to [place] RN on 1A. Patient will be transferred to room 141. Patient is stable, A/O, will transfer via wheelchair.

## 2017-01-02 NOTE — Progress Notes (Signed)
[place] at [place] NAME: Michelle [name]    MR#:  [id number]  DATE OF BIRTH:  July 23, 1960  SUBJECTIVE:  CHIEF COMPLAINT:   Chief Complaint  Patient presents with  . Shortness of Breath     Came with SOB, have COPD and ch oxygen use, required bipap on admission. Felt much better this morning and on nasal canula oxygen. Still wheezing.  REVIEW OF SYSTEMS:  CONSTITUTIONAL: No fever, fatigue or weakness.  EYES: No blurred or double vision.  EARS, NOSE, AND THROAT: No tinnitus or ear pain.  RESPIRATORY: positive for cough, shortness of breath, wheezing or hemoptysis.  CARDIOVASCULAR: No chest pain, orthopnea, edema.  GASTROINTESTINAL: No nausea, vomiting, diarrhea or abdominal pain.  GENITOURINARY: No dysuria, hematuria.  ENDOCRINE: No polyuria, nocturia,  HEMATOLOGY: No anemia, easy bruising or bleeding SKIN: No rash or lesion. MUSCULOSKELETAL: No joint pain or arthritis.   NEUROLOGIC: No tingling, numbness, weakness.  PSYCHIATRY: No anxiety or depression.   ROS  DRUG ALLERGIES:   Allergies  Allergen Reactions  . Morphine And Related     Family reports hallucinations and POA reports she doesn't want pt taking it    VITALS:  Blood pressure (!) 131/92, pulse 87, temperature 98.7 F (37.1 C), temperature source Oral, resp. rate 20, height 5\' 2"  (1.575 m), weight 36.9 kg (81 lb 5.6 oz), last menstrual period 03/06/2005, SpO2 99 %.  PHYSICAL EXAMINATION:  GENERAL:  56 y.o.-year-old patient lying in the bed with no acute distress.  EYES: Pupils equal, round, reactive to light and accommodation. No scleral icterus. Extraocular muscles intact.  HEENT: Head atraumatic, normocephalic. Oropharynx and nasopharynx clear.  NECK:  Supple, no jugular venous distention. No thyroid enlargement, no tenderness.  LUNGS: Normal breath sounds bilaterally, some wheezing, no crepitation. No use of accessory muscles of respiration.  CARDIOVASCULAR: S1, S2  normal. No murmurs, rubs, or gallops.  ABDOMEN: Soft, nontender, nondistended. Bowel sounds present. No organomegaly or mass.  EXTREMITIES: No pedal edema, cyanosis, or clubbing.  NEUROLOGIC: Cranial nerves II through XII are intact. Muscle strength 5/5 in all extremities. Sensation intact. Gait not checked.  PSYCHIATRIC: The patient is alert and oriented x 3.  SKIN: No obvious rash, lesion, or ulcer.   Physical Exam LABORATORY PANEL:   CBC  Recent Labs Lab 01/02/17 0445  WBC 7.4  HGB 11.6*  HCT 35.3  PLT 184   ------------------------------------------------------------------------------------------------------------------  Chemistries   Recent Labs Lab 01/02/17 0445  NA 139  K 3.6  CL 106  CO2 27  GLUCOSE 162*  BUN 18  CREATININE 0.58  CALCIUM 8.7*   ------------------------------------------------------------------------------------------------------------------  Cardiac Enzymes  Recent Labs Lab 01/01/17 1032  TROPONINI <0.03   ------------------------------------------------------------------------------------------------------------------  RADIOLOGY:  Dg Chest 1 View  Result Date: 01/01/2017 CLINICAL DATA:  Shortness of Breath EXAM: CHEST 1 VIEW COMPARISON:  05/26/2016 FINDINGS: There is hyperinflation of the lungs compatible with COPD. Heart and mediastinal contours are within normal limits. No focal opacities or effusions. No acute bony abnormality. IMPRESSION: COPD.  No active disease. Electronically Signed   By: Rolm Baptise M.D.   On: 01/01/2017 10:43    ASSESSMENT AND PLAN:   Active Problems:   COPD exacerbation ([place])  * 56 year old female with chronic respiratory failure and 3 L oxygen who presents with increasing shortness of breath, wheezing and respiratory distress.  1. Acute on chronic hypoxic respiratory failure in the setting of acute COPD exacerbation Initially on BiPAP wean to 3 L of oxygen as  tolerated Appreciated help of Dr.  Alva Garnet  2. Acute exacerbation of COPD with chronic respiratory failure on 3 L oxygen at home: Start Solu-Medrol 40 mg IV every 8 hours Continue bronchodilators/nebulizer treatment Azithromycin for acute bronchitis  3.Tobacco dependence: Patient is encouraged to quit smoking. Counseling was provided for 4 minutes.  4. Essential hypertension: Continue enalapril and Coreg  5. History of CAD: Continue aspirin, Coreg  enalapril and statin    All the records are reviewed and case discussed with Care Management/Social Workerr. Management plans discussed with the patient, family and they are in agreement.  CODE STATUS: full  TOTAL TIME TAKING CARE OF THIS PATIENT: 35 minutes.     POSSIBLE D/C IN 1-2 DAYS, DEPENDING ON CLINICAL CONDITION.   [name] M.D on 01/02/2017   Between 7am to 6pm - Pager - [phone]  After 6pm go to [email or web address] - password [email or web address] [facility]  Office  [phone]  CC: Primary care physician; Center, [facility]  Note: This dictation was prepared with Dragon dictation along with smaller phrase technology. Any transcriptional errors that result from this process are unintentional.

## 2017-01-02 NOTE — Progress Notes (Signed)
Chaplain was making rounds and visited with pt in [place]. Chaplain provided the ministry of prayer and a spiritual presence.    01/02/17 1105  Clinical Encounter Type  Visited With Patient  Visit Type Initial;Spiritual support  Referral From Nurse  Consult/Referral To Chaplain  Spiritual Encounters  Spiritual Needs Prayer

## 2017-01-02 NOTE — Progress Notes (Signed)
Apparently, much improved and she reports nearly back to her baseline Comfortable on [place] O2 No new complaints  Vitals:   01/02/17 0900 01/02/17 1000 01/02/17 1100 01/02/17 1200  BP: 123/82 117/76 122/87 113/76  Pulse: 71 74 76 82  Resp: 13 15 18 17   Temp:      TempSrc:      SpO2: 99% 95% 98% 96%  Weight:      Height:        NAD HEENT WNL No JVD Hyperresonant to percussion Diminished breath sounds without wheezes Regular, no M NABS, NT Extremities warm, no edema No focal neurologic deficits  BMP Latest Ref Rng & Units 01/02/2017 01/01/2017 05/27/2016  Glucose 65 - 99 mg/dL 162(H) 130(H) 91  BUN 6 - 20 mg/dL 18 16 16   Creatinine 0.44 - 1.00 mg/dL 0.58 0.76 0.57  Sodium 135 - 145 mmol/L 139 144 140  Potassium 3.5 - 5.1 mmol/L 3.6 4.1 3.8  Chloride 101 - 111 mmol/L 106 105 105  CO2 22 - 32 mmol/L 27 31 29   Calcium 8.9 - 10.3 mg/dL 8.7(L) 9.2 8.2(L)    CBC Latest Ref Rng & Units 01/02/2017 01/01/2017 05/27/2016  WBC 3.6 - 11.0 K/uL 7.4 3.9 10.3  Hemoglobin 12.0 - 16.0 g/dL 11.6(L) 13.3 11.8(L)  Hematocrit 35.0 - 47.0 % 35.3 40.6 36.3  Platelets 150 - 440 K/uL 184 197 297    No new chest x-ray  IMPRESSION: Acute/chronic hypoxemic respiratory failure Severe emphysema - followed as outpatient at [facility] COPD - improving Smoker  PLAN/REC: Transfer to Med[facility] Change azithromycin to PO Change systemic steroids to PO Resume Spiriva Resume ICS/LABA - she takes Advair at home. Dulera substituted Change DuoNeb to when necessary  After transfer,PCCM will sign off. Please call if we can be of further assistance   Merton Border, MD PCCM service Mobile [phone] Pager [phone] 01/02/2017 1:01 PM

## 2017-01-03 LAB — HIV ANTIBODY (ROUTINE TESTING W REFLEX): HIV Screen 4th Generation wRfx: NONREACTIVE

## 2017-01-03 NOTE — Progress Notes (Signed)
[place] at [place] NAME: Michelle [name]    MR#:  [id number]  DATE OF BIRTH:  1960-12-29  SUBJECTIVE:  CHIEF COMPLAINT:   Chief Complaint  Patient presents with  . Shortness of Breath     Came with SOB, have COPD and ch oxygen use, required bipap on admission. Felt much better this morning and on nasal canula oxygen. Still wheezing.   Felt SOB with walking a few steps out of her room with oxygen support, which is not normal for her.  REVIEW OF SYSTEMS:  CONSTITUTIONAL: No fever, fatigue or weakness.  EYES: No blurred or double vision.  EARS, NOSE, AND THROAT: No tinnitus or ear pain.  RESPIRATORY: positive for cough, shortness of breath, wheezing , no hemoptysis.  CARDIOVASCULAR: No chest pain, orthopnea, edema.  GASTROINTESTINAL: No nausea, vomiting, diarrhea or abdominal pain.  GENITOURINARY: No dysuria, hematuria.  ENDOCRINE: No polyuria, nocturia,  HEMATOLOGY: No anemia, easy bruising or bleeding SKIN: No rash or lesion. MUSCULOSKELETAL: No joint pain or arthritis.   NEUROLOGIC: No tingling, numbness, weakness.  PSYCHIATRY: No anxiety or depression.   ROS  DRUG ALLERGIES:   Allergies  Allergen Reactions  . Morphine And Related     Family reports hallucinations and POA reports she doesn't want pt taking it    VITALS:  Blood pressure 119/62, pulse 78, temperature 98.1 F (36.7 C), temperature source Oral, resp. rate 19, height 5\' 2"  (1.575 m), weight 36.9 kg (81 lb 5.6 oz), last menstrual period 03/06/2005, SpO2 98 %.  PHYSICAL EXAMINATION:  GENERAL:  56 y.o.-year-old patient lying in the bed with no acute distress.  EYES: Pupils equal, round, reactive to light and accommodation. No scleral icterus. Extraocular muscles intact.  HEENT: Head atraumatic, normocephalic. Oropharynx and nasopharynx clear.  NECK:  Supple, no jugular venous distention. No thyroid enlargement, no tenderness.  LUNGS: Normal breath sounds bilaterally,  some wheezing, no crepitation. No use of accessory muscles of respiration.  CARDIOVASCULAR: S1, S2 normal. No murmurs, rubs, or gallops.  ABDOMEN: Soft, nontender, nondistended. Bowel sounds present. No organomegaly or mass.  EXTREMITIES: No pedal edema, cyanosis, or clubbing.  NEUROLOGIC: Cranial nerves II through XII are intact. Muscle strength 5/5 in all extremities. Sensation intact. Gait not checked.  PSYCHIATRIC: The patient is alert and oriented x 3.  SKIN: No obvious rash, lesion, or ulcer.   Physical Exam LABORATORY PANEL:   CBC  Recent Labs Lab 01/02/17 0445  WBC 7.4  HGB 11.6*  HCT 35.3  PLT 184   ------------------------------------------------------------------------------------------------------------------  Chemistries   Recent Labs Lab 01/02/17 0445  NA 139  K 3.6  CL 106  CO2 27  GLUCOSE 162*  BUN 18  CREATININE 0.58  CALCIUM 8.7*   ------------------------------------------------------------------------------------------------------------------  Cardiac Enzymes  Recent Labs Lab 01/01/17 1032  TROPONINI <0.03   ------------------------------------------------------------------------------------------------------------------  RADIOLOGY:  Dg Chest 1 View  Result Date: 01/01/2017 CLINICAL DATA:  Shortness of Breath EXAM: CHEST 1 VIEW COMPARISON:  05/26/2016 FINDINGS: There is hyperinflation of the lungs compatible with COPD. Heart and mediastinal contours are within normal limits. No focal opacities or effusions. No acute bony abnormality. IMPRESSION: COPD.  No active disease. Electronically Signed   By: Rolm Baptise M.D.   On: 01/01/2017 10:43    ASSESSMENT AND PLAN:   Active Problems:   COPD exacerbation ([place])  * 56 year old female with chronic respiratory failure and 3 L oxygen who presents with increasing shortness of breath, wheezing and respiratory distress.  1. Acute  on chronic hypoxic respiratory failure in the setting of acute COPD  exacerbation Initially on BiPAP wean to 3 L of oxygen as tolerated ( home Oxygen) Appreciated help of Dr. Alva Garnet  2. Acute exacerbation of COPD with chronic respiratory failure on 3 L oxygen at home: Start Solu-Medrol 40 mg IV every 8 hours Continue bronchodilators/nebulizer treatment Azithromycin for acute bronchitis   Still have SOB with minimal exertion, cont the treatment and follow tomorrow.  3.Tobacco dependence: Patient is encouraged to quit smoking. Counseling was provided for 4 minutes.  4. Essential hypertension: Continue enalapril and Coreg  5. History of CAD: Continue aspirin, Coreg  enalapril and statin    All the records are reviewed and case discussed with Care Management/Social Workerr. Management plans discussed with the patient, family and they are in agreement.  CODE STATUS: full  TOTAL TIME TAKING CARE OF THIS PATIENT: 35 minutes.     POSSIBLE D/C IN 1-2 DAYS, DEPENDING ON CLINICAL CONDITION.   [name] M.D on 01/03/2017   Between 7am to 6pm - Pager - [phone]  After 6pm go to [email or web address] - password [email or web address] [facility]  Office  [phone]  CC: Primary care physician; Center, [facility]  Note: This dictation was prepared with Dragon dictation along with smaller phrase technology. Any transcriptional errors that result from this process are unintentional.

## 2017-01-03 NOTE — Progress Notes (Signed)
Rept received from [facility]. Pt resting semi fowlers in bed. No s/sx of distress and no c/o such. Oxygen per nasal cannula intact.  Family at bedside and supportive. Pt sleeping but easily aroused. Pt sitting up to eat "some pizza" brought in by family. Will continue to [place].

## 2017-01-03 NOTE — Progress Notes (Signed)
Nurse tech ambulated patient around nurses station on O2 3L. Patient felt shortwinded about half way around, O2 sats maintained through out walk around 97-98%.

## 2017-01-03 NOTE — Progress Notes (Signed)
Patient a&o, vss. O2 3L chronic. Possible d/c if patient is stable and ambulatory without SOB/hypoxia. No c/o pain or distress at the moment. Continue to monitor.

## 2017-01-04 MED ORDER — MOMETASONE FURO-FORMOTEROL FUM 200-5 MCG/ACT IN AERO: 2.0000 | INHALATION_SPRAY | Freq: Two times a day (BID) | RESPIRATORY_TRACT | 0 refills | Status: DC

## 2017-01-04 MED ORDER — NICOTINE 14 MG/24HR TD PT24: 14.0000 mg | MEDICATED_PATCH | Freq: Every day | TRANSDERMAL | 0 refills | Status: DC

## 2017-01-04 MED ORDER — IPRATROPIUM-ALBUTEROL 0.5-2.5 (3) MG/3ML IN SOLN: 3.0000 mL | Freq: Four times a day (QID) | RESPIRATORY_TRACT | 0 refills | Status: DC | PRN

## 2017-01-04 MED ORDER — AZITHROMYCIN 250 MG PO TABS: 250.0000 mg | ORAL_TABLET | Freq: Every day | ORAL | 0 refills | Status: AC

## 2017-01-04 MED ORDER — TIOTROPIUM BROMIDE MONOHYDRATE 18 MCG IN CAPS: 18.0000 ug | ORAL_CAPSULE | Freq: Every day | RESPIRATORY_TRACT | 0 refills | Status: DC

## 2017-01-04 MED ORDER — LORAZEPAM 1 MG PO TABS: 1.0000 mg | ORAL_TABLET | Freq: Two times a day (BID) | ORAL | 0 refills | Status: DC | PRN

## 2017-01-04 MED ORDER — PREDNISONE 10 MG (21) PO TBPK: ORAL_TABLET | ORAL | 0 refills | Status: DC

## 2017-01-04 NOTE — Progress Notes (Signed)
Patient is alert and oriented and able to verbalize needs. Vital signs stable. PIV removed. Discharge instructions gone over with patient at this time. Prescriptions faxed to med management and will be ready around noon. Pt verbalizes understanding of this and rx's given to patients along with printed AVS. Pt verbalizes understanding of all follow  up care and instructions. No concerns voiced at this time. Family member will be providing transportation home.   Bethann Punches, RN

## 2017-01-04 NOTE — Discharge Summary (Signed)
[facility] NAME: Michelle [name]    MR#:  [id number]  DATE OF BIRTH:  [birth year]  DATE OF ADMISSION:  01/01/2017 ADMITTING PHYSICIAN: Bettey Costa, MD  DATE OF DISCHARGE: 01/04/2017   PRIMARY CARE PHYSICIAN: [facility]    ADMISSION DIAGNOSIS:  sob  DISCHARGE DIAGNOSIS:  Active Problems:   COPD exacerbation ([place])   SECONDARY DIAGNOSIS:   Past Medical History:  Diagnosis Date  . Allergy   . Anxiety   . Asthma   . CHF (congestive heart failure) ([place])   . Cocaine abuse   . COPD (chronic obstructive pulmonary disease) ([place])   . Coronary artery disease   . Emphysema/COPD ([place])   . Hepatitis C   . High cholesterol   . Hypertension   . Pneumothorax   . Tobacco abuse     HOSPITAL COURSE:   * 56 year old female with chronic respiratory failure and 3 L oxygen who presents with increasing shortness of breath, wheezing and respiratory distress.  1. Acute on chronic hypoxic respiratory failure in the setting of acute COPD exacerbation Initially on BiPAP wean to 3 L of oxygen as tolerated ( home Oxygen) Appreciated help of Dr. Alva Garnet  2. Acute exacerbation of COPD with chronic respiratory failure on 3 L oxygen at home: Start Solu-Medrol 40 mg IV every 8 hours Continue bronchodilators/nebulizer treatment Azithromycin for acute bronchitis   on further question, pt says- at her baseline at home walking 15-20 steps to bathroom- she gets SOB. Also when going out for appointment or In stores- she get SOB and sometimes afraid to come off the car. She is almost at her baseline now, will d/c home.  3.Tobacco dependence: Patient is encouraged to quit smoking. Counseling was provided for 4 minutes.  4. Essential hypertension: Continue enalapril and Coreg  5. History of CAD: Continue aspirin, Coreg enalapril and statin  DISCHARGE CONDITIONS:   Stable.  CONSULTS OBTAINED:    DRUG ALLERGIES:    Allergies  Allergen Reactions  . Morphine And Related     Family reports hallucinations and POA reports she doesn't want pt taking it    DISCHARGE MEDICATIONS:   Current Discharge Medication List    START taking these medications   Details  azithromycin (ZITHROMAX) 250 MG tablet Take 1 tablet (250 mg total) by mouth daily. Qty: 2 each, Refills: 0    nicotine (NICODERM CQ - DOSED IN MG/24 HOURS) 14 mg/24hr patch Place 1 patch (14 mg total) onto the skin daily. Qty: 28 patch, Refills: 0      CONTINUE these medications which have CHANGED   Details  ipratropium-albuterol (DUONEB) 0.5-2.5 (3) MG/3ML SOLN Take 3 mLs by nebulization every 6 (six) hours as needed. Qty: 360 mL, Refills: 0    LORazepam (ATIVAN) 1 MG tablet Take 1 tablet (1 mg total) by mouth 2 (two) times daily as needed for anxiety. Qty: 20 tablet, Refills: 0    mometasone-formoterol (DULERA) 200-5 MCG/ACT AERO Inhale 2 puffs into the lungs 2 (two) times daily. Qty: 1 Inhaler, Refills: 0    predniSONE (STERAPRED UNI-PAK 21 TAB) 10 MG (21) TBPK tablet Take 6 tabs first day, 5 tab on day 2, then 4 on day 3rd, 3 tabs on day 4th , 2 tab on day 5th, and 1 tab on 6th day. Qty: 21 tablet, Refills: 0    tiotropium (SPIRIVA) 18 MCG inhalation capsule Place 1 capsule (18 mcg total) into inhaler and inhale daily. Qty:  30 capsule, Refills: 0      CONTINUE these medications which have NOT CHANGED   Details  albuterol (PROVENTIL HFA;VENTOLIN HFA) 108 (90 Base) MCG/ACT inhaler Inhale 2 puffs into the lungs every 6 (six) hours as needed for wheezing or shortness of breath. Qty: 1 Inhaler, Refills: 0    aspirin EC 81 MG tablet Take 81 mg by mouth daily.    carvedilol (COREG) 3.125 MG tablet Take 1 tablet (3.125 mg total) by mouth every morning. Qty: 60 tablet, Refills: 2    enalapril (VASOTEC) 10 MG tablet Take 0.5 tablets (5 mg total) by mouth daily. Qty: 30 tablet, Refills: 2    simvastatin (ZOCOR) 20 MG tablet Take 20  mg by mouth daily.    feeding supplement, ENSURE ENLIVE, (ENSURE ENLIVE) LIQD Take 237 mLs by mouth 2 (two) times daily between meals. Qty: 237 mL, Refills: 12    guaiFENesin-dextromethorphan (ROBITUSSIN DM) 100-10 MG/5ML syrup Take 5 mLs by mouth every 4 (four) hours as needed for cough (chest congestion). Qty: 118 mL, Refills: 0         DISCHARGE INSTRUCTIONS:    Stable.  If you experience worsening of your admission symptoms, develop shortness of breath, life threatening emergency, suicidal or homicidal thoughts you must seek medical attention immediately by calling 911 or calling your MD immediately  if symptoms less severe.  You Must read complete instructions/literature along with all the possible adverse reactions/side effects for all the Medicines you take and that have been prescribed to you. Take any new Medicines after you have completely understood and accept all the possible adverse reactions/side effects.   Please note  You were cared for by a hospitalist during your hospital stay. If you have any questions about your discharge medications or the care you received while you were in the hospital after you are discharged, you can call the unit and asked to speak with the hospitalist on call if the hospitalist that took care of you is not available. Once you are discharged, your primary care physician will handle any further medical issues. Please note that NO REFILLS for any discharge medications will be authorized once you are discharged, as it is imperative that you return to your primary care physician (or establish a relationship with a primary care physician if you do not have one) for your aftercare needs so that they can reassess your need for medications and monitor your lab values.    Today   CHIEF COMPLAINT:   Chief Complaint  Patient presents with  . Shortness of Breath    HISTORY OF PRESENT ILLNESS:  Michelle [name]  is a 56 y.o. female with a known history of  Chronic respiratory failure on 3 L oxygen due to COPD, tobacco dependence and CAD who presents with above complaint. Patient reports over the past week she has had increasing shortness of breath, wheezing, cough and dyspnea exertion. She denies PND, orthopnea or chest pain. She denies sick contacts or fevers. She has been sitting up to sleep over the past week. Her daughter called EMS due to increased difficulty breathing this morning. When EMS arrived her oxygen saturation is 100% on her chronic 3 L but she could not complete her sentences and lung sounds were diminished. She was initially placed on nonrebreather at 10 L. She was given 3 albuterol in route as well as 125 mg of IV Solu-Medrol. In the emergency room she had increased work of breathing/tripoding and therefore is placed on BiPAP where she  appears to be more comfortable.  VITAL SIGNS:  Blood pressure 139/84, pulse 63, temperature 98.9 F (37.2 C), temperature source Oral, resp. rate 16, height 5\' 2"  (1.575 m), weight 36.9 kg (81 lb 5.6 oz), last menstrual period 03/06/2005, SpO2 100 %.  I/O:   Intake/Output Summary (Last 24 hours) at 01/04/17 0856 Last data filed at 01/03/17 1014  Gross per 24 hour  Intake              120 ml  Output                0 ml  Net              120 ml    PHYSICAL EXAMINATION:   GENERAL:  56 y.o.-year-old patient lying in the bed with no acute distress.  EYES: Pupils equal, round, reactive to light and accommodation. No scleral icterus. Extraocular muscles intact.  HEENT: Head atraumatic, normocephalic. Oropharynx and nasopharynx clear.  NECK:  Supple, no jugular venous distention. No thyroid enlargement, no tenderness.  LUNGS: Normal breath sounds bilaterally, some wheezing, no crepitation. No use of accessory muscles of respiration.  CARDIOVASCULAR: S1, S2 normal. No murmurs, rubs, or gallops.  ABDOMEN: Soft, nontender, nondistended. Bowel sounds present. No organomegaly or mass.  EXTREMITIES: No  pedal edema, cyanosis, or clubbing.  NEUROLOGIC: Cranial nerves II through XII are intact. Muscle strength 5/5 in all extremities. Sensation intact. Gait not checked.  PSYCHIATRIC: The patient is alert and oriented x 3.  SKIN: No obvious rash, lesion, or ulcer.   DATA REVIEW:   CBC  Recent Labs Lab 01/02/17 0445  WBC 7.4  HGB 11.6*  HCT 35.3  PLT 184    Chemistries   Recent Labs Lab 01/02/17 0445  NA 139  K 3.6  CL 106  CO2 27  GLUCOSE 162*  BUN 18  CREATININE 0.58  CALCIUM 8.7*    Cardiac Enzymes  Recent Labs Lab 01/01/17 1032  TROPONINI <0.03    Microbiology Results  Results for orders placed or performed during the hospital encounter of 01/01/17  MRSA PCR Screening     Status: None   Collection Time: 01/01/17 11:43 AM  Result Value Ref Range Status   MRSA by PCR NEGATIVE NEGATIVE Final    Comment:        The GeneXpert MRSA Assay (FDA approved for NASAL specimens only), is one component of a comprehensive MRSA colonization surveillance program. It is not intended to diagnose MRSA infection nor to guide or monitor treatment for MRSA infections.     RADIOLOGY:  No results found.  EKG:   Orders placed or performed during the hospital encounter of 01/01/17  . ED EKG  . ED EKG  . EKG 12-Lead  . EKG 12-Lead      Management plans discussed with the patient, family and they are in agreement.  CODE STATUS:     Code Status Orders        Start     Ordered   01/01/17 1144  Full code  Continuous     01/01/17 1144    Code Status History    Date Active Date Inactive Code Status Order ID Comments User Context   05/22/2016  9:55 PM 05/27/2016  4:45 PM Full Code [id number]  Fritzi Mandes, MD Inpatient   04/23/2016  8:20 PM 04/27/2016  6:48 PM Full Code [id number]  Hower, Aaron Mose, MD ED   02/07/2016  7:22 PM 02/10/2016  6:35 PM Full Code [id number]  Gladstone Lighter, MD Inpatient   11/29/2015  5:19 AM 12/01/2015  4:09 PM Full Code [id number]  Harrie Foreman, MD Inpatient   11/05/2015  9:11 AM 11/07/2015  4:06 PM Full Code [id number]  Hillary Bow, MD ED   10/24/2014  3:22 PM 10/27/2014  4:30 PM Full Code [id number]  Hillary Bow, MD Inpatient      TOTAL TIME TAKING CARE OF THIS PATIENT: 35 minutes.    [name] M.D on 01/04/2017 at 8:56 AM  Between 7am to 6pm - Pager - [phone]  After 6pm go to [email or web address] - password [email or web address] [facility]  Office  [phone]  CC: Primary care physician; [facility]   Note: This dictation was prepared with Dragon dictation along with smaller phrase technology. Any transcriptional errors that result from this process are unintentional.

## 2017-01-26 ENCOUNTER — Emergency Department: Payer: Medicaid Other

## 2017-01-26 DIAGNOSIS — F17210 Nicotine dependence, cigarettes, uncomplicated: Secondary | ICD-10-CM | POA: Diagnosis not present

## 2017-01-26 DIAGNOSIS — Z79899 Other long term (current) drug therapy: Secondary | ICD-10-CM | POA: Insufficient documentation

## 2017-01-26 DIAGNOSIS — Z7982 Long term (current) use of aspirin: Secondary | ICD-10-CM | POA: Insufficient documentation

## 2017-01-26 DIAGNOSIS — R0602 Shortness of breath: Secondary | ICD-10-CM | POA: Diagnosis present

## 2017-01-26 DIAGNOSIS — I119 Hypertensive heart disease without heart failure: Secondary | ICD-10-CM | POA: Diagnosis not present

## 2017-01-26 DIAGNOSIS — J441 Chronic obstructive pulmonary disease with (acute) exacerbation: Secondary | ICD-10-CM | POA: Insufficient documentation

## 2017-01-26 LAB — CBC WITH DIFFERENTIAL/PLATELET
Basophils Absolute: 0 10*3/uL (ref 0–0.1)
Basophils Relative: 1 %
EOS ABS: 0.2 10*3/uL (ref 0–0.7)
Eosinophils Relative: 3 %
HEMATOCRIT: 41.9 % (ref 35.0–47.0)
HEMOGLOBIN: 13.6 g/dL (ref 12.0–16.0)
LYMPHS ABS: 1.2 10*3/uL (ref 1.0–3.6)
LYMPHS PCT: 23 %
MCH: 29.4 pg (ref 26.0–34.0)
MCHC: 32.4 g/dL (ref 32.0–36.0)
MCV: 90.7 fL (ref 80.0–100.0)
MONOS PCT: 11 %
Monocytes Absolute: 0.6 10*3/uL (ref 0.2–0.9)
Neutro Abs: 3.3 10*3/uL (ref 1.4–6.5)
Neutrophils Relative %: 62 %
Platelets: 172 10*3/uL (ref 150–440)
RBC: 4.62 MIL/uL (ref 3.80–5.20)
RDW: 13.5 % (ref 11.5–14.5)
WBC: 5.3 10*3/uL (ref 3.6–11.0)

## 2017-01-26 LAB — COMPREHENSIVE METABOLIC PANEL
ALBUMIN: 4.1 g/dL (ref 3.5–5.0)
ALT: 17 U/L (ref 14–54)
AST: 19 U/L (ref 15–41)
Alkaline Phosphatase: 51 U/L (ref 38–126)
Anion gap: 8 (ref 5–15)
BUN: 18 mg/dL (ref 6–20)
CHLORIDE: 109 mmol/L (ref 101–111)
CO2: 27 mmol/L (ref 22–32)
CREATININE: 0.87 mg/dL (ref 0.44–1.00)
Calcium: 9.1 mg/dL (ref 8.9–10.3)
GFR calc Af Amer: >60 mL/min (ref >60)
GFR calc non Af Amer: >60 mL/min (ref >60)
GLUCOSE: 105 mg/dL — AB (ref 65–99)
Potassium: 3.6 mmol/L (ref 3.5–5.1)
Sodium: 144 mmol/L (ref 135–145)
Total Bilirubin: 0.2 mg/dL — ABNORMAL LOW (ref 0.3–1.2)
Total Protein: 6.8 g/dL (ref 6.5–8.1)

## 2017-01-26 LAB — TROPONIN I

## 2017-01-26 NOTE — ED Triage Notes (Signed)
Pt arrives to ed complains of shob. Pt wears oxygen at 3lpm via [place] chronically, however tank was empty on arrival. Pt placed on oxygen at 4lpm via  in triage with imrprovement of resp distress. Breath sounds diminished, pt is able to speak in full sentences after resting and placed on oxygen in triage.

## 2017-01-27 ENCOUNTER — Emergency Department
Admission: EM | Admit: 2017-01-27 | Discharge: 2017-01-27 | Disposition: A | Discharge status: Home / Self Care | Payer: Medicaid Other | Attending: Emergency Medicine | Admitting: Emergency Medicine

## 2017-01-27 DIAGNOSIS — J441 Chronic obstructive pulmonary disease with (acute) exacerbation: Secondary | ICD-10-CM

## 2017-01-27 MED ORDER — LORAZEPAM 0.5 MG PO TABS
0.5000 mg | ORAL_TABLET | Freq: Once | ORAL | Status: AC
  Administered 2017-01-27: 0.5 mg via ORAL
  Filled 2017-01-27: qty 1

## 2017-01-27 MED ORDER — AZITHROMYCIN 500 MG PO TABS
500.0000 mg | ORAL_TABLET | Freq: Once | ORAL | Status: AC
  Administered 2017-01-27: 500 mg via ORAL
  Filled 2017-01-27: qty 1

## 2017-01-27 MED ORDER — IPRATROPIUM-ALBUTEROL 0.5-2.5 (3) MG/3ML IN SOLN
3.0000 mL | Freq: Once | RESPIRATORY_TRACT | Status: AC
  Administered 2017-01-27: 3 mL via RESPIRATORY_TRACT
  Filled 2017-01-27: qty 3

## 2017-01-27 MED ORDER — PREDNISONE 20 MG PO TABS: 60.0000 mg | ORAL_TABLET | Freq: Every day | ORAL | 0 refills | Status: AC

## 2017-01-27 MED ORDER — ALBUTEROL SULFATE (2.5 MG/3ML) 0.083% IN NEBU: 2.5000 mg | INHALATION_SOLUTION | Freq: Four times a day (QID) | RESPIRATORY_TRACT | 12 refills | Status: DC | PRN

## 2017-01-27 MED ORDER — METHYLPREDNISOLONE SODIUM SUCC 125 MG IJ SOLR
125.0000 mg | Freq: Once | INTRAMUSCULAR | Status: AC
  Administered 2017-01-27: 125 mg via INTRAVENOUS
  Filled 2017-01-27: qty 2

## 2017-01-27 MED ORDER — AZITHROMYCIN 500 MG PO TABS: 500.0000 mg | ORAL_TABLET | Freq: Every day | ORAL | 0 refills | Status: AC

## 2017-01-27 NOTE — ED Notes (Signed)
Pt. Going home with daughter. 

## 2017-01-27 NOTE — ED Provider Notes (Signed)
[facility] Emergency Department Provider Note    First MD Initiated Contact with Patient 01/27/17 [phone]     (approximate)  I have reviewed the triage vital signs and the nursing notes.   HISTORY  Chief Complaint Shortness of Breath    HPI Michelle [name] is a 56 y.o. female with below list of chronic medical conditions including COPD presents to the emergency department with three-day history of progressive dyspnea and cough. Patient denies any fever. Patient states symptoms unrelieved with home nebulizer treatments.   Past Medical History:  Diagnosis Date  . Allergy   . Anxiety   . Asthma   . CHF (congestive heart failure) ([place])   . Cocaine abuse   . COPD (chronic obstructive pulmonary disease) ([place])   . Coronary artery disease   . Emphysema/COPD ([place])   . Hepatitis C   . High cholesterol   . Hypertension   . Pneumothorax   . Tobacco abuse     Patient Active Problem List   Diagnosis Date Noted  . Protein-calorie malnutrition, severe 05/23/2016  . Sepsis ([place]) 05/22/2016  . Malnutrition of moderate degree ([place]) 10/25/2014  . COPD exacerbation ([place]) 10/24/2014  . Tobacco abuse 10/24/2014    Past Surgical History:  Procedure Laterality Date  . CARDIAC CATHETERIZATION    . CHEST TUBE INSERTION      Prior to Admission medications   Medication Sig Start Date End Date Taking? Authorizing Provider  albuterol (PROVENTIL HFA;VENTOLIN HFA) 108 (90 Base) MCG/ACT inhaler Inhale 2 puffs into the lungs every 6 (six) hours as needed for wheezing or shortness of breath. 05/27/16   Max Sane, MD  aspirin EC 81 MG tablet Take 81 mg by mouth daily.    [provider]  carvedilol (COREG) 3.125 MG tablet Take 1 tablet (3.125 mg total) by mouth every morning. 02/10/16   Gladstone Lighter, MD  enalapril (VASOTEC) 10 MG tablet Take 0.5 tablets (5 mg total) by mouth daily. 02/10/16   Gladstone Lighter, MD  feeding supplement, ENSURE ENLIVE, (ENSURE  ENLIVE) LIQD Take 237 mLs by mouth 2 (two) times daily between meals. 10/27/14   Gouru, Illene Silver, MD  guaiFENesin-dextromethorphan (ROBITUSSIN DM) 100-10 MG/5ML syrup Take 5 mLs by mouth every 4 (four) hours as needed for cough (chest congestion). Patient not taking: Reported on 01/01/2017 05/27/16   Max Sane, MD  ipratropium-albuterol (DUONEB) 0.5-2.5 (3) MG/3ML SOLN Take 3 mLs by nebulization every 6 (six) hours as needed. 01/04/17   [name], MD  LORazepam (ATIVAN) 1 MG tablet Take 1 tablet (1 mg total) by mouth 2 (two) times daily as needed for anxiety. 01/04/17   [name], MD  mometasone-formoterol (DULERA) 200-5 MCG/ACT AERO Inhale 2 puffs into the lungs 2 (two) times daily. 01/04/17   [name], MD  nicotine (NICODERM CQ - DOSED IN MG/24 HOURS) 14 mg/24hr patch Place 1 patch (14 mg total) onto the skin daily. 01/04/17   [name], MD  predniSONE (STERAPRED UNI-PAK 21 TAB) 10 MG (21) TBPK tablet Take 6 tabs first day, 5 tab on day 2, then 4 on day 3rd, 3 tabs on day 4th , 2 tab on day 5th, and 1 tab on 6th day. 01/04/17   [name], MD  simvastatin (ZOCOR) 20 MG tablet Take 20 mg by mouth daily.    [provider]  tiotropium (SPIRIVA) 18 MCG inhalation capsule Place 1 capsule (18 mcg total) into inhaler and inhale daily. 01/04/17   [name], MD  Allergies Morphine and related  Family History  Problem Relation Age of Onset  . Lung cancer Mother   . Asthma Mother   . CVA Father   . CAD Father   . Stroke Father   . Breast cancer Maternal Aunt 67  . COPD Sister     Social History Social History  Substance Use Topics  . Smoking status: Current Every Day Smoker    Packs/day: 0.25    Years: 41.00    Types: Cigarettes  . Smokeless tobacco: Never Used  . Alcohol use No    Review of Systems Constitutional: No fever/chills Eyes: No visual changes. ENT: No sore throat. Cardiovascular: Denies chest  pain. Respiratory: Positive for dyspnea cough and wheezing Gastrointestinal: No abdominal pain.  No nausea, no vomiting.  No diarrhea.  No constipation. Genitourinary: Negative for dysuria. Musculoskeletal: Negative for neck pain.  Negative for back pain. Integumentary: Negative for rash. Neurological: Negative for headaches, focal weakness or numbness.   ____________________________________________   PHYSICAL EXAM:  VITAL SIGNS: ED Triage Vitals  Enc Vitals Group     BP 01/26/17 2319 (!) 164/108     Pulse Rate 01/26/17 2319 88     Resp 01/26/17 2319 (!) 24     Temp 01/26/17 2319 98.4 F (36.9 C)     Temp Source 01/26/17 2319 Oral     SpO2 01/26/17 2319 98 %     Weight 01/26/17 2319 37.2 kg (82 lb)     Height 01/26/17 2319 1.626 m (5\' 4" )     Head Circumference --      Peak Flow --      Pain Score 01/27/17 0204 5     Pain Loc --      Pain Edu? --      Excl. in [place]? --     Constitutional: Alert and oriented. Apparent respiratory distress Eyes: Conjunctivae are normal.  Head: Atraumatic. Mouth/Throat: Mucous membranes are moist.  Oropharynx non-erythematous. Neck: No stridor.   Cardiovascular: Normal rate, regular rhythm. Good peripheral circulation. Grossly normal heart sounds. Respiratory: Tachypnea, positive accessory respiratory muscle use, diffuse rhonchi and wheezing Gastrointestinal: Soft and nontender. No distention.   Musculoskeletal: No lower extremity tenderness nor edema. No gross deformities of extremities. Neurologic:  Normal speech and language. No gross focal neurologic deficits are appreciated.  Skin:  Skin is warm, dry and intact. No rash noted. Psychiatric: Mood and affect are normal. Speech and behavior are normal.  ____________________________________________   LABS (all labs ordered are listed, but only abnormal results are displayed)  Labs Reviewed  COMPREHENSIVE METABOLIC PANEL - Abnormal; Notable for the following:       Result Value    Glucose, Bld 105 (*)    Total Bilirubin 0.2 (*)    All other components within normal limits  CBC WITH DIFFERENTIAL/PLATELET  TROPONIN I   ____________________________________________  EKG  ED ECG REPORT I, [place] Dorval, the attending physician, personally viewed and interpreted this ECG.   Date: 01/27/2017  EKG Time: 11:21 PM  Rate: 85  Rhythm: Normal sinus rhythm biatrial enlargement  Axis: Normal  Intervals: Normal  ST&T Change: None  ____________________________________________  RADIOLOGY I, [place] Elliemae Braman, personally viewed and evaluated these images (plain radiographs) as part of my medical decision making, as well as reviewing the written report by the radiologist.  Dg Chest 2 View  Result Date: 01/27/2017 CLINICAL DATA:  Dyspnea EXAM: CHEST  2 VIEW COMPARISON:  01/01/2017 FINDINGS: Heart size is normal. There is thoracic  aortic atherosclerosis without aneurysm enveloping the arch. The lungs are markedly hyperinflated with attenuated pulmonary and interstitial lung markings consistent with COPD. No effusion or pneumonic consolidation. No overt pulmonary edema. No acute nor suspicious osseous abnormality. IMPRESSION: 1. Emphysematous hyperinflation of the lungs without acute pneumonic consolidation or CHF. 2. Aortic atherosclerosis. Electronically Signed   By: Ashley Royalty M.D.   On: 01/27/2017 00:17    ____________________________________________   PROCEDURES  Critical Care performed: CRITICAL CARE Performed by: Gregor Hams   Total critical care time: 30 minutes  Critical care time was exclusive of separately billable procedures and treating other patients.  Critical care was necessary to treat or prevent imminent or life-threatening deterioration.  Critical care was time spent personally by me on the following activities: development of treatment plan with patient and/or surrogate as well as nursing, discussions with consultants, evaluation of patient's  response to treatment, examination of patient, obtaining history from patient or surrogate, ordering and performing treatments and interventions, ordering and review of laboratory studies, ordering and review of radiographic studies, pulse oximetry and re-evaluation of patient's condition.  Procedures   ____________________________________________   INITIAL IMPRESSION / ASSESSMENT AND PLAN / ED COURSE  Pertinent labs & imaging results that were available during my care of the patient were reviewed by me and considered in my medical decision making (see chart for details).  56 year old female with history of COPD with apparent respiratory distress clinical exam consistent with possible COPD exacerbation. Patient given 2 DuoNeb's, Solu-Medrol 125 mg and azithromycin 500 mg.  On reexamination patient work of breathing markedly improved wheezing resolved. Patient stating "I feel great".      ____________________________________________  FINAL CLINICAL IMPRESSION(S) / ED DIAGNOSES  Final diagnoses:  COPD exacerbation ([place])     MEDICATIONS GIVEN DURING THIS VISIT:  Medications  methylPREDNISolone sodium succinate (SOLU-MEDROL) 125 mg/2 mL injection 125 mg (not administered)  ipratropium-albuterol (DUONEB) 0.5-2.5 (3) MG/3ML nebulizer solution 3 mL (not administered)  ipratropium-albuterol (DUONEB) 0.5-2.5 (3) MG/3ML nebulizer solution 3 mL (not administered)     NEW OUTPATIENT MEDICATIONS STARTED DURING THIS VISIT:  New Prescriptions   No medications on file    Modified Medications   No medications on file    Discontinued Medications   No medications on file     Note:  This document was prepared using Dragon voice recognition software and may include unintentional dictation errors.    Gregor Hams, MD 01/27/17 [phone]

## 2017-01-27 NOTE — ED Notes (Signed)
Pt. States hx of lung issues (COPD).  Pt. States difficulty getting breath for the past 2 days.  Pt. States she is on 2-3L of O2 all the time.  Pt. In no distress at this time.  Pt. States using nebulizer at night time.

## 2017-02-17 IMAGING — DX DG CHEST 1V
1 series · 1 of 1 positions shown · non-contrast
Comparison: 01/17/2015

CLINICAL DATA: Presents to emergency department with respiratory
distress.

EXAM:
CHEST 1 VIEW

[chest ap]
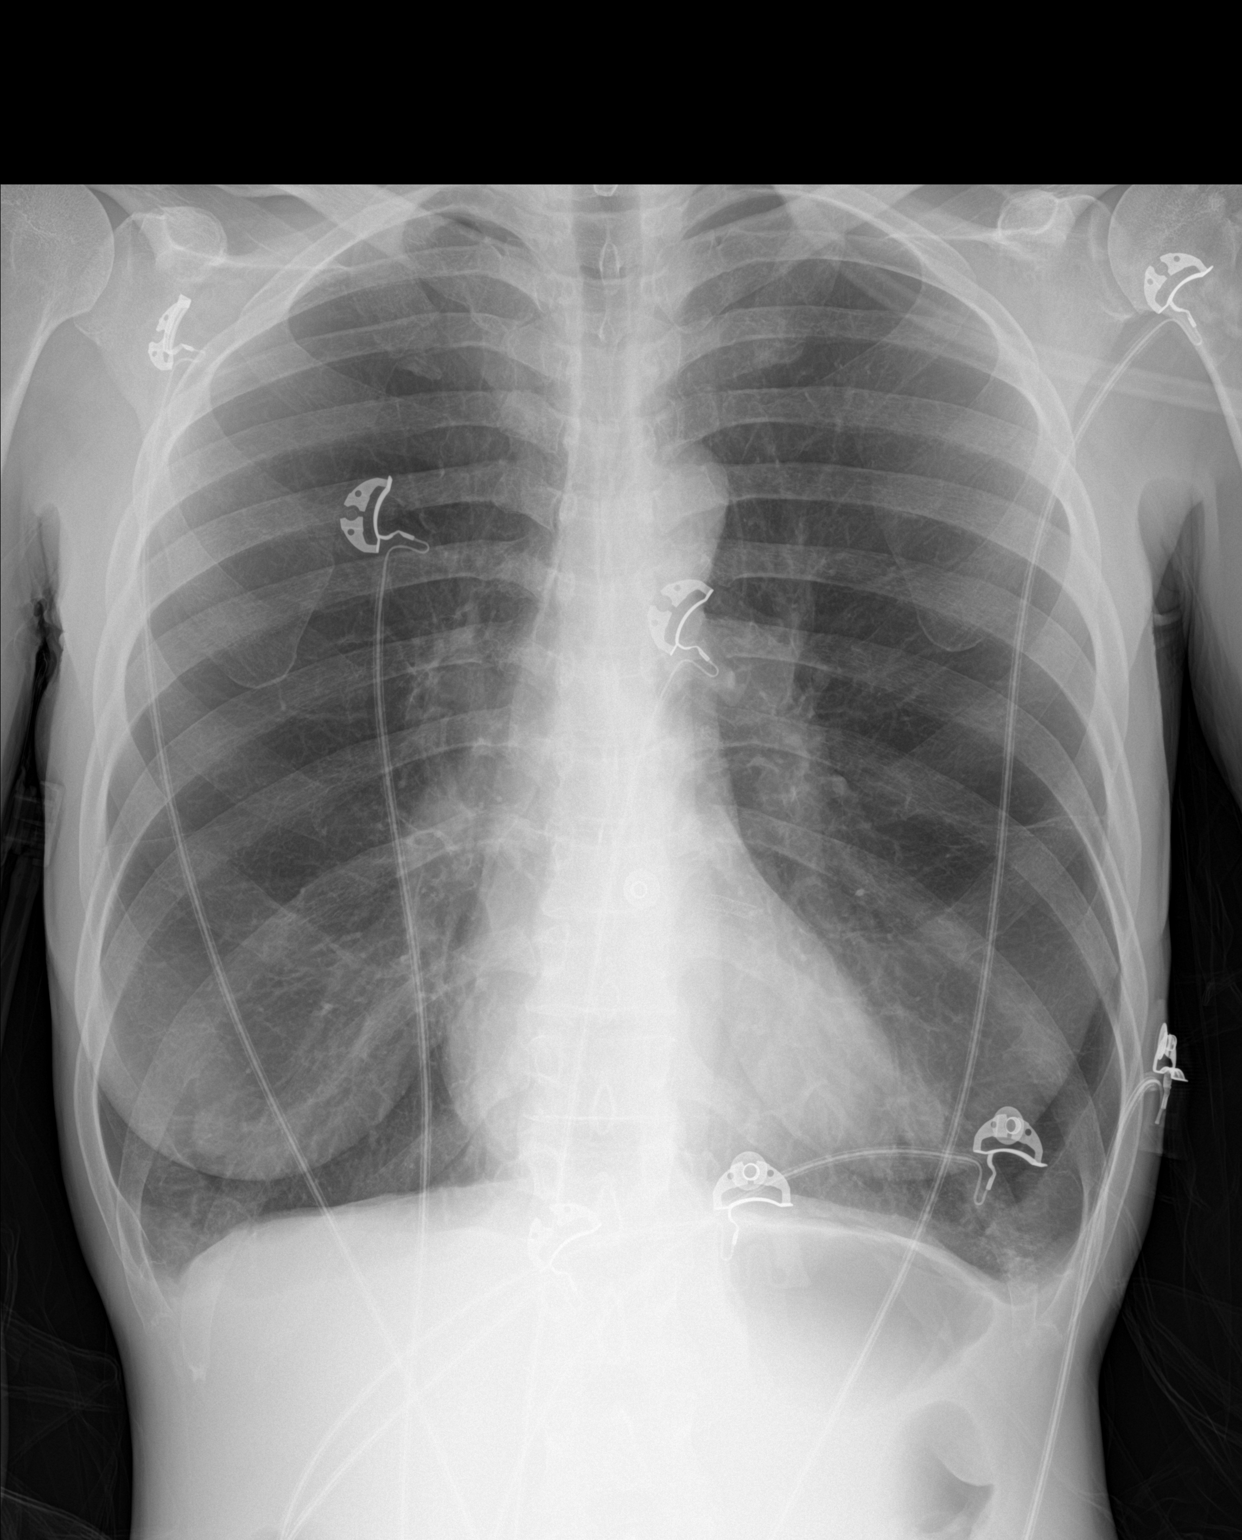

[1 of 1 positions shown; findings below may reference images not displayed]

FINDINGS: Normal heart size. No pleural effusion or edema. The lungs are
hyperinflated with coarsened interstitial markings suggestive of
COPD. No superimposed airspace consolidation.
IMPRESSION: 1. Findings compatible with COPD.
2. No evidence for pneumonia.

## 2017-02-22 ENCOUNTER — Emergency Department: Payer: Medicaid Other

## 2017-02-22 ENCOUNTER — Inpatient Hospital Stay
Admission: EM | Admit: 2017-02-22 | Discharge: 2017-02-26 | DRG: 190 | Disposition: A | Discharge status: Home / Self Care | Payer: Medicaid Other | Attending: Internal Medicine | Admitting: Internal Medicine

## 2017-02-22 ENCOUNTER — Encounter: Payer: Self-pay | Admitting: Emergency Medicine

## 2017-02-22 DIAGNOSIS — Z8619 Personal history of other infectious and parasitic diseases: Secondary | ICD-10-CM

## 2017-02-22 DIAGNOSIS — Z79899 Other long term (current) drug therapy: Secondary | ICD-10-CM

## 2017-02-22 DIAGNOSIS — I248 Other forms of acute ischemic heart disease: Secondary | ICD-10-CM | POA: Diagnosis present

## 2017-02-22 DIAGNOSIS — J9621 Acute and chronic respiratory failure with hypoxia: Secondary | ICD-10-CM | POA: Diagnosis present

## 2017-02-22 DIAGNOSIS — E785 Hyperlipidemia, unspecified: Secondary | ICD-10-CM | POA: Diagnosis present

## 2017-02-22 DIAGNOSIS — J441 Chronic obstructive pulmonary disease with (acute) exacerbation: Principal | ICD-10-CM | POA: Diagnosis present

## 2017-02-22 DIAGNOSIS — I10 Essential (primary) hypertension: Secondary | ICD-10-CM | POA: Diagnosis present

## 2017-02-22 DIAGNOSIS — Z7982 Long term (current) use of aspirin: Secondary | ICD-10-CM

## 2017-02-22 DIAGNOSIS — I2510 Atherosclerotic heart disease of native coronary artery without angina pectoris: Secondary | ICD-10-CM | POA: Diagnosis present

## 2017-02-22 DIAGNOSIS — Z9981 Dependence on supplemental oxygen: Secondary | ICD-10-CM

## 2017-02-22 DIAGNOSIS — F17210 Nicotine dependence, cigarettes, uncomplicated: Secondary | ICD-10-CM | POA: Diagnosis present

## 2017-02-22 DIAGNOSIS — I4510 Unspecified right bundle-branch block: Secondary | ICD-10-CM | POA: Diagnosis present

## 2017-02-22 DIAGNOSIS — R0603 Acute respiratory distress: Secondary | ICD-10-CM

## 2017-02-22 DIAGNOSIS — Z885 Allergy status to narcotic agent status: Secondary | ICD-10-CM

## 2017-02-22 LAB — BLOOD GAS, VENOUS
ACID-BASE EXCESS: 3.1 mmol/L — AB (ref 0.0–2.0)
BICARBONATE: 29.5 mmol/L — AB (ref 20.0–28.0)
FIO2: 0.28
O2 SAT: 63.3 %
Patient temperature: 37
pCO2, Ven: 51 mmHg (ref 44.0–60.0)
pH, Ven: 7.37 (ref 7.250–7.430)
pO2, Ven: 34 mmHg (ref 32.0–45.0)

## 2017-02-22 LAB — CBC
HCT: 45.9 % (ref 35.0–47.0)
HEMOGLOBIN: 15.2 g/dL (ref 12.0–16.0)
MCH: 29 pg (ref 26.0–34.0)
MCHC: 33 g/dL (ref 32.0–36.0)
MCV: 87.7 fL (ref 80.0–100.0)
PLATELETS: 240 10*3/uL (ref 150–440)
RBC: 5.24 MIL/uL — ABNORMAL HIGH (ref 3.80–5.20)
RDW: 13.4 % (ref 11.5–14.5)
WBC: 13.4 10*3/uL — ABNORMAL HIGH (ref 3.6–11.0)

## 2017-02-22 LAB — BASIC METABOLIC PANEL
Anion gap: 11 (ref 5–15)
BUN: 24 mg/dL — AB (ref 6–20)
CO2: 29 mmol/L (ref 22–32)
CREATININE: 0.64 mg/dL (ref 0.44–1.00)
Calcium: 9.9 mg/dL (ref 8.9–10.3)
Chloride: 100 mmol/L — ABNORMAL LOW (ref 101–111)
Glucose, Bld: 123 mg/dL — ABNORMAL HIGH (ref 65–99)
Potassium: 3.4 mmol/L — ABNORMAL LOW (ref 3.5–5.1)
SODIUM: 140 mmol/L (ref 135–145)

## 2017-02-22 LAB — TROPONIN I: Troponin I: 0.04 ng/mL (ref <0.03)

## 2017-02-22 LAB — LACTIC ACID, PLASMA: LACTIC ACID, VENOUS: 1.2 mmol/L (ref 0.5–1.9)

## 2017-02-22 MED ORDER — METHYLPREDNISOLONE SODIUM SUCC 125 MG IJ SOLR
125.0000 mg | Freq: Once | INTRAMUSCULAR | Status: AC
  Administered 2017-02-22: 125 mg via INTRAVENOUS
  Filled 2017-02-22: qty 2

## 2017-02-22 MED ORDER — IPRATROPIUM-ALBUTEROL 0.5-2.5 (3) MG/3ML IN SOLN
3.0000 mL | Freq: Once | RESPIRATORY_TRACT | Status: AC
  Administered 2017-02-22: 3 mL via RESPIRATORY_TRACT
  Filled 2017-02-22: qty 3

## 2017-02-22 MED ORDER — SODIUM CHLORIDE 0.9 % IV BOLUS (SEPSIS)
1000.0000 mL | Freq: Once | INTRAVENOUS | Status: AC
  Administered 2017-02-22: 1000 mL via INTRAVENOUS

## 2017-02-22 MED ORDER — MAGNESIUM SULFATE 2 GM/50ML IV SOLN
2.0000 g | Freq: Once | INTRAVENOUS | Status: AC
  Administered 2017-02-22: 2 g via INTRAVENOUS
  Filled 2017-02-22: qty 50

## 2017-02-22 NOTE — ED Provider Notes (Signed)
[facility] Emergency Department Provider Note    First MD Initiated Contact with Patient 02/22/17 2253     (approximate)  I have reviewed the triage vital signs and the nursing notes.   HISTORY  Chief Complaint Shortness of Breath  Level V Caveat:  Acute respiratory distress  HPI Michelle [name] is a 56 y.o. female history of substance abuse as well as COPD, CHF and CAD presents with chief complaint of worsening shortness of breath for the past 2 days. She been taking her rescue inhalers without relief. Has had a persistent cough that is been nonproductive. No fevers at home. Patient arrives to the ER in respiratory distress tripoding in her bed.   Past Medical History:  Diagnosis Date  . Allergy   . Anxiety   . Asthma   . CHF (congestive heart failure) ([place])   . Cocaine abuse   . COPD (chronic obstructive pulmonary disease) ([place])   . Coronary artery disease   . Emphysema/COPD ([place])   . Hepatitis C   . High cholesterol   . Hypertension   . Pneumothorax   . Tobacco abuse    Family History  Problem Relation Age of Onset  . Lung cancer Mother   . Asthma Mother   . CVA Father   . CAD Father   . Stroke Father   . Breast cancer Maternal Aunt 67  . COPD Sister    Past Surgical History:  Procedure Laterality Date  . CARDIAC CATHETERIZATION    . CHEST TUBE INSERTION     Patient Active Problem List   Diagnosis Date Noted  . Protein-calorie malnutrition, severe 05/23/2016  . Sepsis ([place]) 05/22/2016  . Malnutrition of moderate degree ([place]) 10/25/2014  . COPD exacerbation ([place]) 10/24/2014  . Tobacco abuse 10/24/2014      Prior to Admission medications   Medication Sig Start Date End Date Taking? Authorizing Provider  albuterol (PROVENTIL HFA;VENTOLIN HFA) 108 (90 Base) MCG/ACT inhaler Inhale 2 puffs into the lungs every 6 (six) hours as needed for wheezing or shortness of breath. 05/27/16   Max Sane, MD  albuterol (PROVENTIL) (2.5  MG/3ML) 0.083% nebulizer solution Take 3 mLs (2.5 mg total) by nebulization every 6 (six) hours as needed for wheezing or shortness of breath. 01/27/17   Gregor Hams, MD  aspirin EC 81 MG tablet Take 81 mg by mouth daily.    [provider]  carvedilol (COREG) 3.125 MG tablet Take 1 tablet (3.125 mg total) by mouth every morning. 02/10/16   Gladstone Lighter, MD  enalapril (VASOTEC) 10 MG tablet Take 0.5 tablets (5 mg total) by mouth daily. 02/10/16   Gladstone Lighter, MD  feeding supplement, ENSURE ENLIVE, (ENSURE ENLIVE) LIQD Take 237 mLs by mouth 2 (two) times daily between meals. 10/27/14   Gouru, Illene Silver, MD  guaiFENesin-dextromethorphan (ROBITUSSIN DM) 100-10 MG/5ML syrup Take 5 mLs by mouth every 4 (four) hours as needed for cough (chest congestion). Patient not taking: Reported on 01/01/2017 05/27/16   Max Sane, MD  ipratropium-albuterol (DUONEB) 0.5-2.5 (3) MG/3ML SOLN Take 3 mLs by nebulization every 6 (six) hours as needed. 01/04/17   [name], MD  LORazepam (ATIVAN) 1 MG tablet Take 1 tablet (1 mg total) by mouth 2 (two) times daily as needed for anxiety. 01/04/17   [name], MD  mometasone-formoterol (DULERA) 200-5 MCG/ACT AERO Inhale 2 puffs into the lungs 2 (two) times daily. 01/04/17   [name], MD  nicotine (NICODERM CQ - DOSED IN  MG/24 HOURS) 14 mg/24hr patch Place 1 patch (14 mg total) onto the skin daily. 01/04/17   [name], MD  predniSONE (STERAPRED UNI-PAK 21 TAB) 10 MG (21) TBPK tablet Take 6 tabs first day, 5 tab on day 2, then 4 on day 3rd, 3 tabs on day 4th , 2 tab on day 5th, and 1 tab on 6th day. 01/04/17   [name], MD  simvastatin (ZOCOR) 20 MG tablet Take 20 mg by mouth daily.    [provider]  tiotropium (SPIRIVA) 18 MCG inhalation capsule Place 1 capsule (18 mcg total) into inhaler and inhale daily. 01/04/17   [name], MD    Allergies Morphine and related    Social  History Social History  Substance Use Topics  . Smoking status: Current Every Day Smoker    Packs/day: 0.25    Years: 41.00    Types: Cigarettes  . Smokeless tobacco: Never Used  . Alcohol use No    Review of Systems Patient denies headaches, rhinorrhea, blurry vision, numbness, shortness of breath, chest pain, edema, cough, abdominal pain, nausea, vomiting, diarrhea, dysuria, fevers, rashes or hallucinations unless otherwise stated above in HPI. ____________________________________________   PHYSICAL EXAM:  VITAL SIGNS: Vitals:   02/22/17 2251  BP: (!) 152/94  Pulse: 92  Resp: (!) 24  Temp: 98.3 F (36.8 C)  SpO2: 94%    Constitutional: Alert , tripoding in acute respiratory distress Eyes: Conjunctivae are normal.  Head: Atraumatic. Nose: No congestion/rhinnorhea. Mouth/Throat: Mucous membranes are moist.   Neck: No stridor. Painless ROM.  Cardiovascular: Normal rate, regular rhythm. Grossly normal heart sounds.  Good peripheral circulation. Respiratory: tripoding, diminished breath sounds throughout, no crackles or rhonchi Gastrointestinal: Soft and nontender. No distention. No abdominal bruits. No CVA tenderness. Musculoskeletal: No lower extremity tenderness nor edema.  No joint effusions. Neurologic:  No gross focal neurologic deficits are appreciated. No facial droop Skin:  Skin is warm, dry and intact. No rash noted. Psychiatric: appropriate  ____________________________________________   LABS (all labs ordered are listed, but only abnormal results are displayed)  Results for orders placed or performed during the hospital encounter of 02/22/17 (from the past 24 hour(s))  Troponin I     Status: Abnormal   Collection Time: 02/22/17 10:54 PM  Result Value Ref Range   Troponin I 0.04 (HH) <0.03 ng/mL  CBC     Status: Abnormal   Collection Time: 02/22/17 10:54 PM  Result Value Ref Range   WBC 13.4 (H) 3.6 - 11.0 K/uL   RBC 5.24 (H) 3.80 - 5.20 MIL/uL    Hemoglobin 15.2 12.0 - 16.0 g/dL   HCT 45.9 35.0 - 47.0 %   MCV 87.7 80.0 - 100.0 fL   MCH 29.0 26.0 - 34.0 pg   MCHC 33.0 32.0 - 36.0 g/dL   RDW 13.4 11.5 - 14.5 %   Platelets 240 150 - 440 K/uL  Basic metabolic panel     Status: Abnormal   Collection Time: 02/22/17 10:54 PM  Result Value Ref Range   Sodium 140 135 - 145 mmol/L   Potassium 3.4 (L) 3.5 - 5.1 mmol/L   Chloride 100 (L) 101 - 111 mmol/L   CO2 29 22 - 32 mmol/L   Glucose, Bld 123 (H) 65 - 99 mg/dL   BUN 24 (H) 6 - 20 mg/dL   Creatinine, Ser 0.64 0.44 - 1.00 mg/dL   Calcium 9.9 8.9 - 10.3 mg/dL   GFR calc non Af Amer >60 >60 mL/min  GFR calc Af Amer >60 >60 mL/min   Anion gap 11 5 - 15  Blood gas, venous     Status: Abnormal   Collection Time: 02/22/17 10:54 PM  Result Value Ref Range   FIO2 0.28    Delivery systems NASAL CANNULA    pH, Ven 7.37 7.250 - 7.430   pCO2, Ven 51 44.0 - 60.0 mmHg   pO2, Ven 34.0 32.0 - 45.0 mmHg   Bicarbonate 29.5 (H) 20.0 - 28.0 mmol/L   Acid-Base Excess 3.1 (H) 0.0 - 2.0 mmol/L   O2 Saturation 63.3 %   Patient temperature 37.0    Collection site VENOUS    Sample type VENOUS   Lactic acid, plasma     Status: None   Collection Time: 02/22/17 10:54 PM  Result Value Ref Range   Lactic Acid, Venous 1.2 0.5 - 1.9 mmol/L   ____________________________________________  EKG My review and personal interpretation at Time: 22:48   Indication: sob  Rate: 90  Rhythm: sinus Axis: normal Other: rbbb, no stemi, non specific st changes ____________________________________________  RADIOLOGY  I personally reviewed all radiographic images ordered to evaluate for the above acute complaints and reviewed radiology reports and findings.  These findings were personally discussed with the patient.  Please see medical record for radiology report.  ____________________________________________   PROCEDURES  Procedure(s) performed:  Procedures    Critical Care performed: yes CRITICAL  CARE Performed by: Merlyn Lot   Total critical care time: 35 minutes  Critical care time was exclusive of separately billable procedures and treating other patients.  Critical care was necessary to treat or prevent imminent or life-threatening deterioration.  Critical care was time spent personally by me on the following activities: development of treatment plan with patient and/or surrogate as well as nursing, discussions with consultants, evaluation of patient's response to treatment, examination of patient, obtaining history from patient or surrogate, ordering and performing treatments and interventions, ordering and review of laboratory studies, ordering and review of radiographic studies, pulse oximetry and re-evaluation of patient's condition.  ____________________________________________   INITIAL IMPRESSION / ASSESSMENT AND PLAN / ED COURSE  Pertinent labs & imaging results that were available during my care of the patient were reviewed by me and considered in my medical decision making (see chart for details).  DDX: Asthma, copd, CHF, pna, ptx, malignancy, Pe, anemia   MICKELLE GOUPIL is a 56 y.o. who presents to the ED with acute respiratory distress as described above. Patient tripoding on arrival to the ER. Patient placed on BiPAP for respiratory support. Has diminished breath sounds throughout therefore will start nebulizer therapy. Seems less consistent with ACS or heart failure based on her exam. No new hypoxia to suggest pulmonary embolism. We'll give IV magnesium patient artery receiving Solu-Medrol.  The patient will be placed on continuous pulse oximetry and telemetry for monitoring.  Laboratory evaluation will be sent to evaluate for the above complaints.    ----------------------------------------- 12:14 AM on 02/23/2017 -----------------------------------------  Patient with improvement in air movement. Chest x-ray without any evidence of pneumothorax or heart  failure. This most consistent with COPD exacerbation. I don't see any evidence of pneumonia. Patient still on BiPAP at this point. I spoke to Dr. Jannifer Franklin of hospitalist group regarding admission. Plan will be to observe patient on BiPAP therapy for roughly one more hour to evaluate for aortic to de-escalate her BiPAP therapy.  Have discussed with the patient and available family all diagnostics and treatments performed thus far and all  questions were answered to the best of my ability. The patient demonstrates understanding and agreement with plan.      ____________________________________________   FINAL CLINICAL IMPRESSION(S) / ED DIAGNOSES  Final diagnoses:  Acute respiratory distress  COPD exacerbation ([place])      NEW MEDICATIONS STARTED DURING THIS VISIT:  New Prescriptions   No medications on file     Note:  This document was prepared using Dragon voice recognition software and may include unintentional dictation errors.    Merlyn Lot, MD 02/23/17 [phone]

## 2017-02-22 NOTE — ED Notes (Signed)
Respiratory at bedside.

## 2017-02-22 NOTE — ED Notes (Signed)
Patient currently in tripod position with that being the only thing that gives relief.  MD at bedside.

## 2017-02-22 NOTE — ED Triage Notes (Signed)
Pt come into the ED via [facility] from home c/o shortness of breath x 2 days.  Patient has h/o COPD and states she has been using her rescue inhalers with no relief.  Patient wears 3 L chronically at home.  Patient is labored and dyspnea at rest.  Patient alert and oriented x4.  Cough present that is nonproductive at this time.  Patient denies any chest pain or dizziness.  1 duoneb given in route and patient completed round of [prednisone yesterday per her MD.

## 2017-02-23 ENCOUNTER — Encounter: Payer: Self-pay | Admitting: *Deleted

## 2017-02-23 DIAGNOSIS — F17210 Nicotine dependence, cigarettes, uncomplicated: Secondary | ICD-10-CM | POA: Diagnosis present

## 2017-02-23 DIAGNOSIS — Z8619 Personal history of other infectious and parasitic diseases: Secondary | ICD-10-CM | POA: Diagnosis not present

## 2017-02-23 DIAGNOSIS — I248 Other forms of acute ischemic heart disease: Secondary | ICD-10-CM | POA: Diagnosis present

## 2017-02-23 DIAGNOSIS — J441 Chronic obstructive pulmonary disease with (acute) exacerbation: Secondary | ICD-10-CM | POA: Diagnosis not present

## 2017-02-23 DIAGNOSIS — E785 Hyperlipidemia, unspecified: Secondary | ICD-10-CM | POA: Diagnosis present

## 2017-02-23 DIAGNOSIS — Z9981 Dependence on supplemental oxygen: Secondary | ICD-10-CM | POA: Diagnosis not present

## 2017-02-23 DIAGNOSIS — Z7982 Long term (current) use of aspirin: Secondary | ICD-10-CM | POA: Diagnosis not present

## 2017-02-23 DIAGNOSIS — Z885 Allergy status to narcotic agent status: Secondary | ICD-10-CM | POA: Diagnosis not present

## 2017-02-23 DIAGNOSIS — I10 Essential (primary) hypertension: Secondary | ICD-10-CM | POA: Diagnosis present

## 2017-02-23 DIAGNOSIS — I4510 Unspecified right bundle-branch block: Secondary | ICD-10-CM | POA: Diagnosis present

## 2017-02-23 DIAGNOSIS — J9621 Acute and chronic respiratory failure with hypoxia: Secondary | ICD-10-CM | POA: Diagnosis present

## 2017-02-23 DIAGNOSIS — I2510 Atherosclerotic heart disease of native coronary artery without angina pectoris: Secondary | ICD-10-CM | POA: Diagnosis present

## 2017-02-23 DIAGNOSIS — R0603 Acute respiratory distress: Secondary | ICD-10-CM | POA: Diagnosis present

## 2017-02-23 DIAGNOSIS — Z79899 Other long term (current) drug therapy: Secondary | ICD-10-CM | POA: Diagnosis not present

## 2017-02-23 LAB — TSH: TSH: 0.216 u[IU]/mL — ABNORMAL LOW (ref 0.350–4.500)

## 2017-02-23 LAB — HEMOGLOBIN A1C
Hgb A1c MFr Bld: 5.3 % (ref 4.8–5.6)
Mean Plasma Glucose: 105.41 mg/dL

## 2017-02-23 LAB — TROPONIN I
Troponin I: 0.03 ng/mL (ref <0.03)
Troponin I: <0.03 ng/mL (ref <0.03)
Troponin I: <0.03 ng/mL (ref <0.03)

## 2017-02-23 MED ORDER — ALBUTEROL SULFATE (2.5 MG/3ML) 0.083% IN NEBU: 2.5000 mg | INHALATION_SOLUTION | RESPIRATORY_TRACT | Status: DC | PRN

## 2017-02-23 MED ORDER — METHYLPREDNISOLONE SODIUM SUCC 125 MG IJ SOLR
60.0000 mg | Freq: Four times a day (QID) | INTRAMUSCULAR | Status: DC
  Administered 2017-02-23 – 2017-02-25 (×7): 60 mg via INTRAVENOUS
  Filled 2017-02-23 (×7): qty 2

## 2017-02-23 MED ORDER — AZITHROMYCIN 250 MG PO TABS
500.0000 mg | ORAL_TABLET | Freq: Every day | ORAL | Status: AC
  Administered 2017-02-23: 500 mg via ORAL
  Filled 2017-02-23: qty 2

## 2017-02-23 MED ORDER — ALBUTEROL SULFATE (2.5 MG/3ML) 0.083% IN NEBU
2.5000 mg | INHALATION_SOLUTION | Freq: Once | RESPIRATORY_TRACT | Status: AC
  Administered 2017-02-23: 2.5 mg via RESPIRATORY_TRACT
  Filled 2017-02-23: qty 3

## 2017-02-23 MED ORDER — ENOXAPARIN SODIUM 40 MG/0.4ML ~~LOC~~ SOLN: 40.0000 mg | SUBCUTANEOUS | Status: DC

## 2017-02-23 MED ORDER — DOCUSATE SODIUM 100 MG PO CAPS
100.0000 mg | ORAL_CAPSULE | Freq: Two times a day (BID) | ORAL | Status: DC
  Administered 2017-02-23 – 2017-02-26 (×4): 100 mg via ORAL
  Filled 2017-02-23 (×6): qty 1

## 2017-02-23 MED ORDER — PREDNISONE 20 MG PO TABS: 30.0000 mg | ORAL_TABLET | Freq: Every day | ORAL | Status: DC

## 2017-02-23 MED ORDER — PREDNISONE 20 MG PO TABS: 10.0000 mg | ORAL_TABLET | Freq: Every day | ORAL | Status: DC

## 2017-02-23 MED ORDER — PREDNISONE 50 MG PO TABS
50.0000 mg | ORAL_TABLET | Freq: Every day | ORAL | Status: DC
  Filled 2017-02-23: qty 1

## 2017-02-23 MED ORDER — NICOTINE 14 MG/24HR TD PT24
14.0000 mg | MEDICATED_PATCH | Freq: Every day | TRANSDERMAL | Status: DC
  Administered 2017-02-23 – 2017-02-25 (×3): 14 mg via TRANSDERMAL
  Filled 2017-02-23 (×4): qty 1

## 2017-02-23 MED ORDER — TIOTROPIUM BROMIDE MONOHYDRATE 18 MCG IN CAPS
18.0000 ug | ORAL_CAPSULE | Freq: Every day | RESPIRATORY_TRACT | Status: DC
  Administered 2017-02-23 – 2017-02-26 (×4): 18 ug via RESPIRATORY_TRACT
  Filled 2017-02-23: qty 5

## 2017-02-23 MED ORDER — ACETAMINOPHEN 325 MG PO TABS
650.0000 mg | ORAL_TABLET | Freq: Four times a day (QID) | ORAL | Status: DC | PRN
Start: 2017-02-23 — End: 2017-02-26
  Administered 2017-02-24 – 2017-02-26 (×3): 650 mg via ORAL
  Filled 2017-02-23 (×3): qty 2

## 2017-02-23 MED ORDER — ONDANSETRON HCL 4 MG PO TABS: 4.0000 mg | ORAL_TABLET | Freq: Four times a day (QID) | ORAL | Status: DC | PRN

## 2017-02-23 MED ORDER — SODIUM CHLORIDE 0.9 % IV SOLN
INTRAVENOUS | Status: DC
  Administered 2017-02-23 – 2017-02-24 (×3): via INTRAVENOUS

## 2017-02-23 MED ORDER — GUAIFENESIN ER 600 MG PO TB12
600.0000 mg | ORAL_TABLET | Freq: Two times a day (BID) | ORAL | Status: DC
  Administered 2017-02-23 – 2017-02-26 (×7): 600 mg via ORAL
  Filled 2017-02-23 (×7): qty 1

## 2017-02-23 MED ORDER — AZITHROMYCIN 250 MG PO TABS
250.0000 mg | ORAL_TABLET | Freq: Every day | ORAL | Status: DC
  Administered 2017-02-24 – 2017-02-26 (×3): 250 mg via ORAL
  Filled 2017-02-23 (×3): qty 1

## 2017-02-23 MED ORDER — CARVEDILOL 6.25 MG PO TABS
3.1250 mg | ORAL_TABLET | ORAL | Status: DC
  Administered 2017-02-23 – 2017-02-26 (×4): 3.125 mg via ORAL
  Filled 2017-02-23 (×4): qty 1

## 2017-02-23 MED ORDER — ASPIRIN EC 81 MG PO TBEC
81.0000 mg | DELAYED_RELEASE_TABLET | Freq: Every day | ORAL | Status: DC
  Administered 2017-02-23 – 2017-02-26 (×4): 81 mg via ORAL
  Filled 2017-02-23 (×4): qty 1

## 2017-02-23 MED ORDER — ACETAMINOPHEN 650 MG RE SUPP: 650.0000 mg | Freq: Four times a day (QID) | RECTAL | Status: DC | PRN

## 2017-02-23 MED ORDER — MOMETASONE FURO-FORMOTEROL FUM 200-5 MCG/ACT IN AERO
2.0000 | INHALATION_SPRAY | Freq: Two times a day (BID) | RESPIRATORY_TRACT | Status: DC
  Administered 2017-02-23 (×2): 2 via RESPIRATORY_TRACT
  Filled 2017-02-23: qty 8.8

## 2017-02-23 MED ORDER — ONDANSETRON HCL 4 MG/2ML IJ SOLN
4.0000 mg | Freq: Four times a day (QID) | INTRAMUSCULAR | Status: DC | PRN
  Administered 2017-02-24: 4 mg via INTRAVENOUS
  Filled 2017-02-23: qty 2

## 2017-02-23 MED ORDER — IPRATROPIUM-ALBUTEROL 0.5-2.5 (3) MG/3ML IN SOLN
3.0000 mL | Freq: Four times a day (QID) | RESPIRATORY_TRACT | Status: DC
  Administered 2017-02-23 – 2017-02-26 (×13): 3 mL via RESPIRATORY_TRACT
  Filled 2017-02-23 (×13): qty 3

## 2017-02-23 MED ORDER — PREDNISONE 20 MG PO TABS: 40.0000 mg | ORAL_TABLET | Freq: Every day | ORAL | Status: DC

## 2017-02-23 MED ORDER — GUAIFENESIN-DM 100-10 MG/5ML PO SYRP
5.0000 mL | ORAL_SOLUTION | ORAL | Status: DC | PRN
  Administered 2017-02-23: 5 mL via ORAL
  Filled 2017-02-23: qty 5

## 2017-02-23 MED ORDER — LORAZEPAM 1 MG PO TABS
1.0000 mg | ORAL_TABLET | Freq: Two times a day (BID) | ORAL | Status: DC | PRN
  Administered 2017-02-23 – 2017-02-25 (×4): 1 mg via ORAL
  Filled 2017-02-23 (×4): qty 1

## 2017-02-23 MED ORDER — ENSURE ENLIVE PO LIQD
237.0000 mL | Freq: Two times a day (BID) | ORAL | Status: DC
  Administered 2017-02-23 – 2017-02-26 (×5): 237 mL via ORAL

## 2017-02-23 MED ORDER — PREDNISONE 20 MG PO TABS: 20.0000 mg | ORAL_TABLET | Freq: Every day | ORAL | Status: DC

## 2017-02-23 MED ORDER — TRAMADOL HCL 50 MG PO TABS
50.0000 mg | ORAL_TABLET | Freq: Four times a day (QID) | ORAL | Status: DC | PRN
  Administered 2017-02-23 – 2017-02-25 (×5): 50 mg via ORAL
  Filled 2017-02-23 (×5): qty 1

## 2017-02-23 MED ORDER — SIMVASTATIN 20 MG PO TABS
20.0000 mg | ORAL_TABLET | Freq: Every day | ORAL | Status: DC
  Administered 2017-02-23 – 2017-02-26 (×4): 20 mg via ORAL
  Filled 2017-02-23 (×4): qty 1

## 2017-02-23 MED ORDER — ENALAPRIL MALEATE 5 MG PO TABS
5.0000 mg | ORAL_TABLET | Freq: Every day | ORAL | Status: DC
  Administered 2017-02-23 – 2017-02-26 (×4): 5 mg via ORAL
  Filled 2017-02-23 (×4): qty 1

## 2017-02-23 MED ORDER — ENOXAPARIN SODIUM 30 MG/0.3ML ~~LOC~~ SOLN
30.0000 mg | SUBCUTANEOUS | Status: DC
  Administered 2017-02-23 – 2017-02-25 (×3): 30 mg via SUBCUTANEOUS
  Filled 2017-02-23 (×3): qty 0.3

## 2017-02-23 MED ORDER — INFLUENZA VAC SPLIT QUAD 0.5 ML IM SUSY: 0.5000 mL | PREFILLED_SYRINGE | INTRAMUSCULAR | Status: DC

## 2017-02-23 NOTE — H&P (Signed)
Michelle [name] is an 56 y.o. female.   Chief Complaint: Shortness of breath HPI: The patient with past medical history of COPD, CAD and CHF presents emergency department complaining of shortness of breath. The patient states that she has been coughing severely for the last 2 days. She's been unable to consistently bring anything up. In the emergency department she displayed extensive accessory muscle use which prompted the initiation of BiPAP. She was given Solu-Medrol and eventually wound from noninvasive ventilation. Once she was stabilized the emergency department called the hospitalist service for admission.  Past Medical History:  Diagnosis Date  . Allergy   . Anxiety   . Asthma   . CHF (congestive heart failure) ([place])   . Cocaine abuse   . COPD (chronic obstructive pulmonary disease) ([place])   . Coronary artery disease   . Emphysema/COPD ([place])   . Hepatitis C   . High cholesterol   . Hypertension   . Pneumothorax   . Tobacco abuse     Past Surgical History:  Procedure Laterality Date  . CARDIAC CATHETERIZATION    . CHEST TUBE INSERTION      Family History  Problem Relation Age of Onset  . Lung cancer Mother   . Asthma Mother   . CVA Father   . CAD Father   . Stroke Father   . Breast cancer Maternal Aunt 67  . COPD Sister    Social History:  reports that she has been smoking Cigarettes.  She has a 10.25 pack-year smoking history. She has never used smokeless tobacco. She reports that she does not drink alcohol or use drugs.  Allergies:  Allergies  Allergen Reactions  . Morphine And Related     Family reports hallucinations and POA reports she doesn't want pt taking it    Medications Prior to Admission  Medication Sig Dispense Refill  . albuterol (PROVENTIL HFA;VENTOLIN HFA) 108 (90 Base) MCG/ACT inhaler Inhale 2 puffs into the lungs every 6 (six) hours as needed for wheezing or shortness of breath. 1 Inhaler 0  . albuterol (PROVENTIL) (2.5 MG/3ML) 0.083% nebulizer  solution Take 3 mLs (2.5 mg total) by nebulization every 6 (six) hours as needed for wheezing or shortness of breath. 75 mL 12  . aspirin EC 81 MG tablet Take 81 mg by mouth daily.    . carvedilol (COREG) 3.125 MG tablet Take 1 tablet (3.125 mg total) by mouth every morning. 60 tablet 2  . enalapril (VASOTEC) 10 MG tablet Take 0.5 tablets (5 mg total) by mouth daily. 30 tablet 2  . feeding supplement, ENSURE ENLIVE, (ENSURE ENLIVE) LIQD Take 237 mLs by mouth 2 (two) times daily between meals. 237 mL 12  . guaiFENesin-dextromethorphan (ROBITUSSIN DM) 100-10 MG/5ML syrup Take 5 mLs by mouth every 4 (four) hours as needed for cough (chest congestion). (Patient not taking: Reported on 01/01/2017) 118 mL 0  . ipratropium-albuterol (DUONEB) 0.5-2.5 (3) MG/3ML SOLN Take 3 mLs by nebulization every 6 (six) hours as needed. 360 mL 0  . LORazepam (ATIVAN) 1 MG tablet Take 1 tablet (1 mg total) by mouth 2 (two) times daily as needed for anxiety. 20 tablet 0  . mometasone-formoterol (DULERA) 200-5 MCG/ACT AERO Inhale 2 puffs into the lungs 2 (two) times daily. 1 Inhaler 0  . nicotine (NICODERM CQ - DOSED IN MG/24 HOURS) 14 mg/24hr patch Place 1 patch (14 mg total) onto the skin daily. 28 patch 0  . simvastatin (ZOCOR) 20 MG tablet Take 20 mg by mouth  daily.    . tiotropium (SPIRIVA) 18 MCG inhalation capsule Place 1 capsule (18 mcg total) into inhaler and inhale daily. 30 capsule 0    Results for orders placed or performed during the hospital encounter of 02/22/17 (from the past 48 hour(s))  Troponin I     Status: Abnormal   Collection Time: 02/22/17 10:54 PM  Result Value Ref Range   Troponin I 0.04 (HH) <0.03 ng/mL    Comment: CRITICAL RESULT CALLED TO, READ BACK BY AND VERIFIED WITH KENDALL MOFFITT AT 2334 02/22/17.PMH  CBC     Status: Abnormal   Collection Time: 02/22/17 10:54 PM  Result Value Ref Range   WBC 13.4 (H) 3.6 - 11.0 K/uL   RBC 5.24 (H) 3.80 - 5.20 MIL/uL   Hemoglobin 15.2 12.0 - 16.0 g/dL    HCT 45.9 35.0 - 47.0 %   MCV 87.7 80.0 - 100.0 fL   MCH 29.0 26.0 - 34.0 pg   MCHC 33.0 32.0 - 36.0 g/dL   RDW 13.4 11.5 - 14.5 %   Platelets 240 150 - 440 K/uL  Basic metabolic panel     Status: Abnormal   Collection Time: 02/22/17 10:54 PM  Result Value Ref Range   Sodium 140 135 - 145 mmol/L   Potassium 3.4 (L) 3.5 - 5.1 mmol/L   Chloride 100 (L) 101 - 111 mmol/L   CO2 29 22 - 32 mmol/L   Glucose, Bld 123 (H) 65 - 99 mg/dL   BUN 24 (H) 6 - 20 mg/dL   Creatinine, Ser 0.64 0.44 - 1.00 mg/dL   Calcium 9.9 8.9 - 10.3 mg/dL   GFR calc non Af Amer >60 >60 mL/min   GFR calc Af Amer >60 >60 mL/min    Comment: (NOTE) The eGFR has been calculated using the CKD EPI equation. This calculation has not been validated in all clinical situations. eGFR's persistently <60 mL/min signify possible Chronic Kidney Disease.    Anion gap 11 5 - 15  Blood gas, venous     Status: Abnormal   Collection Time: 02/22/17 10:54 PM  Result Value Ref Range   FIO2 0.28    Delivery systems NASAL CANNULA    pH, Ven 7.37 7.250 - 7.430   pCO2, Ven 51 44.0 - 60.0 mmHg   pO2, Ven 34.0 32.0 - 45.0 mmHg   Bicarbonate 29.5 (H) 20.0 - 28.0 mmol/L   Acid-Base Excess 3.1 (H) 0.0 - 2.0 mmol/L   O2 Saturation 63.3 %   Patient temperature 37.0    Collection site VENOUS    Sample type VENOUS   Lactic acid, plasma     Status: None   Collection Time: 02/22/17 10:54 PM  Result Value Ref Range   Lactic Acid, Venous 1.2 0.5 - 1.9 mmol/L   Dg Chest Portable 1 View  Result Date: 02/22/2017 CLINICAL DATA:  Shortness of breath for 2 days. History of COPD. Cough. History of congestive heart failure, hypertension, smoker. EXAM: PORTABLE CHEST 1 VIEW COMPARISON:  01/26/2017 FINDINGS: Heart size and pulmonary vascularity are normal. Prominent hyperinflation of the lungs likely representing emphysema. No airspace disease or consolidation. No blunting of costophrenic angles. No pneumothorax. Mediastinal contours appear intact.  IMPRESSION: Emphysematous changes in the lungs. No evidence of active pulmonary disease. Electronically Signed   By: Lucienne Capers M.D.   On: 02/22/2017 23:14    Review of Systems  Constitutional: Negative for chills and fever.  HENT: Negative for sore throat and tinnitus.   Eyes: Negative  for blurred vision and redness.  Respiratory: Positive for cough and shortness of breath. Negative for sputum production.   Cardiovascular: Negative for chest pain, palpitations, orthopnea and PND.  Gastrointestinal: Negative for abdominal pain, diarrhea, nausea and vomiting.  Genitourinary: Negative for dysuria, frequency and urgency.  Musculoskeletal: Negative for joint pain and myalgias.  Skin: Negative for rash.       No lesions  Neurological: Negative for speech change, focal weakness and weakness.  Endo/Heme/Allergies: Does not bruise/bleed easily.       No temperature intolerance  Psychiatric/Behavioral: Negative for depression and suicidal ideas.    Blood pressure (!) 144/82, pulse 80, temperature 98.1 F (36.7 C), temperature source Oral, resp. rate 18, height _0  (1.448 m), weight 37.7 kg (83 lb 3.2 oz), last menstrual period 03/06/2005, SpO2 100 %. Physical Exam  Vitals reviewed. Constitutional: She is oriented to person, place, and time. She appears well-developed and well-nourished. No distress.  HENT:  Head: Normocephalic and atraumatic.  Mouth/Throat: Oropharynx is clear and moist.  Eyes: Pupils are equal, round, and reactive to light. Conjunctivae and EOM are normal. No scleral icterus.  Neck: Normal range of motion. Neck supple. No JVD present. No tracheal deviation present. No thyromegaly present.  Cardiovascular: Normal rate, regular rhythm and normal heart sounds.  Exam reveals no gallop and no friction rub.   No murmur heard. Respiratory: Effort normal. She has wheezes.  GI: Soft. Bowel sounds are normal. She exhibits no distension. There is no tenderness.  Genitourinary:   Genitourinary Comments: Deferred  Musculoskeletal: Normal range of motion. She exhibits no edema.  Lymphadenopathy:    She has no cervical adenopathy.  Neurological: She is alert and oriented to person, place, and time. No cranial nerve deficit. She exhibits normal muscle tone.  Skin: Skin is warm and dry. No rash noted.  Psychiatric: She has a normal mood and affect. Her behavior is normal. Judgment and thought content normal.     Assessment/Plan This is a 56 year old female admitted for respiratory failure with hypoxia. 1. Respiratory failure: Acute on chronic; with hypoxia. Supplemental oxygen as needed. Continue steroid taper and azithromycin for anti-inflammatory effect. 2. COPD: Continue inhaled corticosteroid and Spiriva. Albuterol as needed. 3. CAD: Stable. The patient has not had chest pain. Troponin was initially fairly positive but is now trending down. Continue to monitor telemetry. Also continue aspirin. 4. Hypertension: Fluctuating; continue carvedilol and enalapril.  5. Hyperlipidemia: Continue statin therapy 6. DVT prophylaxis: Lovenox 7. GI prophylaxis: None The patient is a full code. Time spent on admission orders and patient care approximately 45 minutes  Harrie Foreman, MD 02/23/2017, 4:25 AM

## 2017-02-23 NOTE — Progress Notes (Signed)
[place] at [facility]                                                                                                                                                                                  Patient Demographics   Michelle [name], is a 56 y.o. female, DOB - [birth year], EVO:[id number]  Admit date - 02/22/2017   Admitting Physician Harrie Foreman, MD  Outpatient Primary MD for the patient is [facility], [facility]   LOS - 0  Subjective: Patient continues to have shortness of breath and some cough and wheezing    Review of Systems:   CONSTITUTIONAL: No documented fever. No fatigue, weakness. No weight gain, no weight loss.  EYES: No blurry or double vision.  ENT: No tinnitus. No postnasal drip. No redness of the oropharynx.  RESPIRATORY: positive cough, positive wheeze, no hemoptysis.positive dyspnea.  CARDIOVASCULAR: No chest pain. No orthopnea. No palpitations. No syncope.  GASTROINTESTINAL: No nausea, no vomiting or diarrhea. No abdominal pain. No melena or hematochezia.  GENITOURINARY: No dysuria or hematuria.  ENDOCRINE: No polyuria or nocturia. No heat or cold intolerance.  HEMATOLOGY: No anemia. No bruising. No bleeding.  INTEGUMENTARY: No rashes. No lesions.  MUSCULOSKELETAL: No arthritis. No swelling. No gout.  NEUROLOGIC: No numbness, tingling, or ataxia. No seizure-type activity.  PSYCHIATRIC: No anxiety. No insomnia. No ADD.    Vitals:   Vitals:   02/23/17 0242 02/23/17 0242 02/23/17 0556 02/23/17 1320  BP:  (!) 144/82 140/71 124/83  Pulse:  80 81 70  Resp:  18 16 18   Temp:  98.1 F (36.7 C) (!) 97.5 F (36.4 C) 98.3 F (36.8 C)  TempSrc:  Oral Oral Oral  SpO2:  100% 97% 99%  Weight: 83 lb 3.2 oz (37.7 kg)     Height: 4\' 9"  (1.448 m)       Wt Readings from Last 3 Encounters:  02/23/17 83 lb 3.2 oz (37.7 kg)  01/26/17 82 lb (37.2 kg)  01/01/17 81 lb 5.6 oz (36.9 kg)     Intake/Output Summary (Last 24 hours)  at 02/23/17 1443 Last data filed at 02/23/17 0914  Gross per 24 hour  Intake            462.5 ml  Output              800 ml  Net           -337.5 ml    Physical Exam:   GENERAL: Pleasant-appearing in no apparent distress.  HEAD, EYES, EARS, NOSE AND THROAT: Atraumatic, normocephalic. Extraocular muscles are intact. Pupils equal and reactive to light. Sclerae anicteric. No conjunctival injection. No oro-pharyngeal  erythema.  NECK: Supple. There is no jugular venous distention. No bruits, no lymphadenopathy, no thyromegaly.  HEART: Regular rate and rhythm,. No murmurs, no rubs, no clicks.  LUNGS: bilateral wheezing throughout both lungs with no  Accessory muscle usage ABDOMEN: Soft, flat, nontender, nondistended. Has good bowel sounds. No hepatosplenomegaly appreciated.  EXTREMITIES: No evidence of any cyanosis, clubbing, or peripheral edema.  +2 pedal and radial pulses bilaterally.  NEUROLOGIC: The patient is alert, awake, and oriented x3 with no focal motor or sensory deficits appreciated bilaterally.  SKIN: Moist and warm with no rashes appreciated.  Psych: Not anxious, depressed LN: No inguinal LN enlargement    Antibiotics   Anti-infectives    Start     Dose/Rate Route Frequency Ordered Stop   02/24/17 1000  azithromycin (ZITHROMAX) tablet 250 mg     250 mg Oral Daily 02/23/17 0241 02/28/17 0959   02/23/17 1000  azithromycin (ZITHROMAX) tablet 500 mg     500 mg Oral Daily 02/23/17 0241 02/23/17 0900      Medications   Scheduled Meds: . aspirin EC  81 mg Oral Daily  . [START ON 02/24/2017] azithromycin  250 mg Oral Daily  . carvedilol  3.125 mg Oral BH-q7a  . docusate sodium  100 mg Oral BID  . enalapril  5 mg Oral Daily  . enoxaparin (LOVENOX) injection  30 mg Subcutaneous Q24H  . feeding supplement (ENSURE ENLIVE)  237 mL Oral BID BM  . guaiFENesin  600 mg Oral BID  . [START ON 02/24/2017] Influenza vac split quadrivalent PF  0.5 mL Intramuscular Tomorrow-1000  .  ipratropium-albuterol  3 mL Nebulization Q6H  . methylPREDNISolone (SOLU-MEDROL) injection  60 mg Intravenous Q6H  . mometasone-formoterol  2 puff Inhalation BID  . nicotine  14 mg Transdermal Daily  . simvastatin  20 mg Oral Daily  . tiotropium  18 mcg Inhalation Daily   Continuous Infusions: . sodium chloride 75 mL/hr at 02/23/17 0302   PRN Meds:.acetaminophen **OR** acetaminophen, guaiFENesin-dextromethorphan, LORazepam, ondansetron **OR** ondansetron (ZOFRAN) IV, traMADol   Data Review:   Micro Results No results found for this or any previous visit (from the past 240 hour(s)).  Radiology Reports Dg Chest 2 View  Result Date: 01/27/2017 CLINICAL DATA:  Dyspnea EXAM: CHEST  2 VIEW COMPARISON:  01/01/2017 FINDINGS: Heart size is normal. There is thoracic aortic atherosclerosis without aneurysm enveloping the arch. The lungs are markedly hyperinflated with attenuated pulmonary and interstitial lung markings consistent with COPD. No effusion or pneumonic consolidation. No overt pulmonary edema. No acute nor suspicious osseous abnormality. IMPRESSION: 1. Emphysematous hyperinflation of the lungs without acute pneumonic consolidation or CHF. 2. Aortic atherosclerosis. Electronically Signed   By: Ashley Royalty M.D.   On: 01/27/2017 00:17   Dg Chest Portable 1 View  Result Date: 02/22/2017 CLINICAL DATA:  Shortness of breath for 2 days. History of COPD. Cough. History of congestive heart failure, hypertension, smoker. EXAM: PORTABLE CHEST 1 VIEW COMPARISON:  01/26/2017 FINDINGS: Heart size and pulmonary vascularity are normal. Prominent hyperinflation of the lungs likely representing emphysema. No airspace disease or consolidation. No blunting of costophrenic angles. No pneumothorax. Mediastinal contours appear intact. IMPRESSION: Emphysematous changes in the lungs. No evidence of active pulmonary disease. Electronically Signed   By: Lucienne Capers M.D.   On: 02/22/2017 23:14      CBC  Recent Labs Lab 02/22/17 2254  WBC 13.4*  HGB 15.2  HCT 45.9  PLT 240  MCV 87.7  MCH 29.0  MCHC 33.0  RDW 13.4    Chemistries   Recent Labs Lab 02/22/17 2254  NA 140  K 3.4*  CL 100*  CO2 29  GLUCOSE 123*  BUN 24*  CREATININE 0.64  CALCIUM 9.9   ------------------------------------------------------------------------------------------------------------------ estimated creatinine clearance is 47.3 mL/min (by C-G formula based on SCr of 0.64 mg/dL). ------------------------------------------------------------------------------------------------------------------  Recent Labs  02/23/17 0317  HGBA1C 5.3   ------------------------------------------------------------------------------------------------------------------ No results for input(s): CHOL, HDL, LDLCALC, TRIG, CHOLHDL, LDLDIRECT in the last 72 hours. ------------------------------------------------------------------------------------------------------------------  Recent Labs  02/23/17 0317  TSH 0.216*   ------------------------------------------------------------------------------------------------------------------ No results for input(s): VITAMINB12, FOLATE, FERRITIN, TIBC, IRON, RETICCTPCT in the last 72 hours.  Coagulation profile No results for input(s): INR, PROTIME in the last 168 hours.  No results for input(s): DDIMER in the last 72 hours.  Cardiac Enzymes  Recent Labs Lab 02/22/17 2254 02/23/17 0317 02/23/17 0940  TROPONINI 0.04* 0.03* <0.03   ------------------------------------------------------------------------------------------------------------------ Invalid input(s): POCBNP    Assessment & Plan   This is a 56 year old female admitted for respiratory failure with hypoxia. 1. Acute on chronicRespiratory failure:; with hypoxia.  On chronic oxygen therapy which we'll continue I will change the prednisone to IV Solu-Medrol due to patient being symptomatic Changes to  every 6 hours Add Mucinex to current regimen  2. COPD: as above 3. CAD: Stable. The patient has not had chest pain.elevated troponin due to demand ischemia. Continue to monitor telemetry. Also continue aspirin. 4. Hypertension: Fluctuating; continue carvedilol and enalapril.  5. Hyperlipidemia: Continue statin therapy 6. DVT prophylaxis: Lovenox 7. GI prophylaxis: None      Code Status Orders        Start     Ordered   02/23/17 0242  Full code  Continuous     02/23/17 0241    Code Status History    Date Active Date Inactive Code Status Order ID Comments User Context   01/01/2017 11:44 AM 01/04/2017  1:54 PM Full Code [id number]  Bettey Costa, MD Inpatient   05/22/2016  9:55 PM 05/27/2016  4:45 PM Full Code [id number]  Fritzi Mandes, MD Inpatient   04/23/2016  8:20 PM 04/27/2016  6:48 PM Full Code [id number]  Hower, Aaron Mose, MD ED   02/07/2016  7:22 PM 02/10/2016  6:35 PM Full Code [id number]  Gladstone Lighter, MD Inpatient   11/29/2015  5:19 AM 12/01/2015  4:09 PM Full Code [id number]  Harrie Foreman, MD Inpatient   11/05/2015  9:11 AM 11/07/2015  4:06 PM Full Code [id number]  Hillary Bow, MD ED   10/24/2014  3:22 PM 10/27/2014  4:30 PM Full Code [id number]  Hillary Bow, MD Inpatient           Consults  none  DVT Prophylaxis  Lovenox   Lab Results  Component Value Date   PLT 240 02/22/2017     Time Spent in minutes  35 minutes  Greater than 50% of time spent in care coordination and counseling patient regarding the condition and plan of care.   Dustin Flock M.D on 02/23/2017 at 2:43 PM  Between 7am to 6pm - Pager - [phone]  After 6pm go to [email or web address] - password [email or web address] [facility]   Office  [phone]

## 2017-02-23 NOTE — ED Notes (Signed)
Marney Setting (son-in-law of patient) can be reached at [phone].

## 2017-02-23 NOTE — Progress Notes (Signed)
Notified MD of request for pain medication, Md acknowledges

## 2017-02-23 NOTE — Progress Notes (Signed)
Lovenox changed to 30 mg daily for TBW <45kg and CrCl >30. 

## 2017-02-23 NOTE — ED Notes (Signed)
Patient weaned off bipap and is tolerating nasal cannula well at this time with good saturation levels.  Patient in NAD at this time with even and unlabored respirations. MD made aware.

## 2017-02-24 LAB — BASIC METABOLIC PANEL
ANION GAP: 5 (ref 5–15)
BUN: 18 mg/dL (ref 6–20)
CHLORIDE: 109 mmol/L (ref 101–111)
CO2: 26 mmol/L (ref 22–32)
CREATININE: 0.42 mg/dL — AB (ref 0.44–1.00)
Calcium: 8.5 mg/dL — ABNORMAL LOW (ref 8.9–10.3)
GFR calc non Af Amer: >60 mL/min (ref >60)
Glucose, Bld: 154 mg/dL — ABNORMAL HIGH (ref 65–99)
Potassium: 4.2 mmol/L (ref 3.5–5.1)
Sodium: 140 mmol/L (ref 135–145)

## 2017-02-24 LAB — MRSA PCR SCREENING: MRSA by PCR: POSITIVE — AB

## 2017-02-24 MED ORDER — MUPIROCIN 2 % EX OINT
1.0000 "application " | TOPICAL_OINTMENT | Freq: Two times a day (BID) | CUTANEOUS | Status: DC
  Administered 2017-02-24 – 2017-02-26 (×4): 1 via NASAL
  Filled 2017-02-24: qty 22

## 2017-02-24 MED ORDER — HYDRALAZINE HCL 20 MG/ML IJ SOLN: 10.0000 mg | Freq: Four times a day (QID) | INTRAMUSCULAR | Status: DC | PRN

## 2017-02-24 MED ORDER — BUDESONIDE 0.25 MG/2ML IN SUSP
0.2500 mg | Freq: Two times a day (BID) | RESPIRATORY_TRACT | Status: DC
  Administered 2017-02-24 – 2017-02-25 (×3): 0.25 mg via RESPIRATORY_TRACT
  Filled 2017-02-24 (×3): qty 2

## 2017-02-24 MED ORDER — ALUM & MAG HYDROXIDE-SIMETH 200-200-20 MG/5ML PO SUSP
30.0000 mL | Freq: Four times a day (QID) | ORAL | Status: DC | PRN
  Administered 2017-02-24: 30 mL via ORAL
  Filled 2017-02-24: qty 30

## 2017-02-24 MED ORDER — CHLORHEXIDINE GLUCONATE CLOTH 2 % EX PADS
6.0000 | MEDICATED_PAD | Freq: Every day | CUTANEOUS | Status: DC
  Administered 2017-02-25 – 2017-02-26 (×2): 6 via TOPICAL

## 2017-02-24 NOTE — Progress Notes (Signed)
[place] at [facility]                                                                                                                                                                                  Patient Demographics   Michelle [name], is a 56 y.o. female, DOB - 02/11/1961, OEU:[id number]  Admit date - 02/22/2017   Admitting Physician Harrie Foreman, MD  Outpatient Primary MD for the patient is [facility]   LOS - 1  Subjective: Patient still short of breath with some improvement in her symptoms    Review of Systems:   CONSTITUTIONAL: No documented fever. No fatigue, weakness. No weight gain, no weight loss.  EYES: No blurry or double vision.  ENT: No tinnitus. No postnasal drip. No redness of the oropharynx.  RESPIRATORY: positive cough, positive wheeze, no hemoptysis.positive dyspnea.  CARDIOVASCULAR: No chest pain. No orthopnea. No palpitations. No syncope.  GASTROINTESTINAL: No nausea, no vomiting or diarrhea. No abdominal pain. No melena or hematochezia.  GENITOURINARY: No dysuria or hematuria.  ENDOCRINE: No polyuria or nocturia. No heat or cold intolerance.  HEMATOLOGY: No anemia. No bruising. No bleeding.  INTEGUMENTARY: No rashes. No lesions.  MUSCULOSKELETAL: No arthritis. No swelling. No gout.  NEUROLOGIC: No numbness, tingling, or ataxia. No seizure-type activity.  PSYCHIATRIC: No anxiety. No insomnia. No ADD.    Vitals:   Vitals:   02/24/17 0805 02/24/17 0932 02/24/17 0934 02/24/17 1333  BP: (!) 142/118 135/72 135/72 131/73  Pulse: 74 72  73  Resp:      Temp:    98.6 F (37 C)  TempSrc:    Oral  SpO2:    100%  Weight:      Height:        Wt Readings from Last 3 Encounters:  02/24/17 86 lb 9.6 oz (39.3 kg)  01/26/17 82 lb (37.2 kg)  01/01/17 81 lb 5.6 oz (36.9 kg)     Intake/Output Summary (Last 24 hours) at 02/24/17 1354 Last data filed at 02/23/17 1847  Gross per 24 hour  Intake              240  ml  Output                0 ml  Net              240 ml    Physical Exam:   GENERAL: Pleasant-appearing in no apparent distress.  HEAD, EYES, EARS, NOSE AND THROAT: Atraumatic, normocephalic. Extraocular muscles are intact. Pupils equal and reactive to light. Sclerae anicteric. No conjunctival injection. No oro-pharyngeal erythema.  NECK: Supple. There is no jugular venous  distention. No bruits, no lymphadenopathy, no thyromegaly.  HEART: Regular rate and rhythm,. No murmurs, no rubs, no clicks.  LUNGS: bilateral wheezing throughout both lungs with no  Accessory muscle usage ABDOMEN: Soft, flat, nontender, nondistended. Has good bowel sounds. No hepatosplenomegaly appreciated.  EXTREMITIES: No evidence of any cyanosis, clubbing, or peripheral edema.  +2 pedal and radial pulses bilaterally.  NEUROLOGIC: The patient is alert, awake, and oriented x3 with no focal motor or sensory deficits appreciated bilaterally.  SKIN: Moist and warm with no rashes appreciated.  Psych: Not anxious, depressed LN: No inguinal LN enlargement    Antibiotics   Anti-infectives    Start     Dose/Rate Route Frequency Ordered Stop   02/24/17 1000  azithromycin (ZITHROMAX) tablet 250 mg     250 mg Oral Daily 02/23/17 0241 02/28/17 0959   02/23/17 1000  azithromycin (ZITHROMAX) tablet 500 mg     500 mg Oral Daily 02/23/17 0241 02/23/17 0900      Medications   Scheduled Meds: . aspirin EC  81 mg Oral Daily  . azithromycin  250 mg Oral Daily  . budesonide (PULMICORT) nebulizer solution  0.25 mg Nebulization BID  . carvedilol  3.125 mg Oral BH-q7a  . docusate sodium  100 mg Oral BID  . enalapril  5 mg Oral Daily  . enoxaparin (LOVENOX) injection  30 mg Subcutaneous Q24H  . feeding supplement (ENSURE ENLIVE)  237 mL Oral BID BM  . guaiFENesin  600 mg Oral BID  . Influenza vac split quadrivalent PF  0.5 mL Intramuscular Tomorrow-1000  . ipratropium-albuterol  3 mL Nebulization Q6H  . methylPREDNISolone  (SOLU-MEDROL) injection  60 mg Intravenous Q6H  . nicotine  14 mg Transdermal Daily  . simvastatin  20 mg Oral Daily  . tiotropium  18 mcg Inhalation Daily   Continuous Infusions:  PRN Meds:.acetaminophen **OR** acetaminophen, guaiFENesin-dextromethorphan, hydrALAZINE, LORazepam, ondansetron **OR** ondansetron (ZOFRAN) IV, traMADol   Data Review:   Micro Results No results found for this or any previous visit (from the past 240 hour(s)).  Radiology Reports Dg Chest 2 View  Result Date: 01/27/2017 CLINICAL DATA:  Dyspnea EXAM: CHEST  2 VIEW COMPARISON:  01/01/2017 FINDINGS: Heart size is normal. There is thoracic aortic atherosclerosis without aneurysm enveloping the arch. The lungs are markedly hyperinflated with attenuated pulmonary and interstitial lung markings consistent with COPD. No effusion or pneumonic consolidation. No overt pulmonary edema. No acute nor suspicious osseous abnormality. IMPRESSION: 1. Emphysematous hyperinflation of the lungs without acute pneumonic consolidation or CHF. 2. Aortic atherosclerosis. Electronically Signed   By: Ashley Royalty M.D.   On: 01/27/2017 00:17   Dg Chest Portable 1 View  Result Date: 02/22/2017 CLINICAL DATA:  Shortness of breath for 2 days. History of COPD. Cough. History of congestive heart failure, hypertension, smoker. EXAM: PORTABLE CHEST 1 VIEW COMPARISON:  01/26/2017 FINDINGS: Heart size and pulmonary vascularity are normal. Prominent hyperinflation of the lungs likely representing emphysema. No airspace disease or consolidation. No blunting of costophrenic angles. No pneumothorax. Mediastinal contours appear intact. IMPRESSION: Emphysematous changes in the lungs. No evidence of active pulmonary disease. Electronically Signed   By: Lucienne Capers M.D.   On: 02/22/2017 23:14     CBC  Recent Labs Lab 02/22/17 2254  WBC 13.4*  HGB 15.2  HCT 45.9  PLT 240  MCV 87.7  MCH 29.0  MCHC 33.0  RDW 13.4    Chemistries   Recent  Labs Lab 02/22/17 2254 02/24/17 0335  NA 140 140  K 3.4* 4.2  CL 100* 109  CO2 29 26  GLUCOSE 123* 154*  BUN 24* 18  CREATININE 0.64 0.42*  CALCIUM 9.9 8.5*   ------------------------------------------------------------------------------------------------------------------ estimated creatinine clearance is 48.4 mL/min (A) (by C-G formula based on SCr of 0.42 mg/dL (L)). ------------------------------------------------------------------------------------------------------------------  Recent Labs  02/23/17 0317  HGBA1C 5.3   ------------------------------------------------------------------------------------------------------------------ No results for input(s): CHOL, HDL, LDLCALC, TRIG, CHOLHDL, LDLDIRECT in the last 72 hours. ------------------------------------------------------------------------------------------------------------------  Recent Labs  02/23/17 0317  TSH 0.216*   ------------------------------------------------------------------------------------------------------------------ No results for input(s): VITAMINB12, FOLATE, FERRITIN, TIBC, IRON, RETICCTPCT in the last 72 hours.  Coagulation profile No results for input(s): INR, PROTIME in the last 168 hours.  No results for input(s): DDIMER in the last 72 hours.  Cardiac Enzymes  Recent Labs Lab 02/23/17 0317 02/23/17 0940 02/23/17 1426  TROPONINI 0.03* <0.03 <0.03   ------------------------------------------------------------------------------------------------------------------ Invalid input(s): POCBNP    Assessment & Plan   This is a 56 year old female admitted for respiratory failure with hypoxia. 1. Acute on chronicRespiratory failure:; with hypoxia.  On chronic oxygen therapy which we'll continue continue IV Solu-Medrol due to patient being symptomatic continue Mucinex to current regimen Slow to improve 2. COPD: as above 3. CAD: Stable. .elevated troponin due to demand ischemia.  Continue to monitor telemetry. Also continue aspirin. 4. Hypertension: Fluctuating; continue carvedilol and enalapril.  5. Hyperlipidemia: Continue statin therapy 6. DVT prophylaxis: Lovenox 7. GI prophylaxis: None      Code Status Orders        Start     Ordered   02/23/17 0242  Full code  Continuous     02/23/17 0241    Code Status History    Date Active Date Inactive Code Status Order ID Comments User Context   01/01/2017 11:44 AM 01/04/2017  1:54 PM Full Code [id number]  Bettey Costa, MD Inpatient   05/22/2016  9:55 PM 05/27/2016  4:45 PM Full Code [id number]  Fritzi Mandes, MD Inpatient   04/23/2016  8:20 PM 04/27/2016  6:48 PM Full Code [id number]  Hower, Aaron Mose, MD ED   02/07/2016  7:22 PM 02/10/2016  6:35 PM Full Code [id number]  Gladstone Lighter, MD Inpatient   11/29/2015  5:19 AM 12/01/2015  4:09 PM Full Code [id number]  Harrie Foreman, MD Inpatient   11/05/2015  9:11 AM 11/07/2015  4:06 PM Full Code [id number]  Hillary Bow, MD ED   10/24/2014  3:22 PM 10/27/2014  4:30 PM Full Code [id number]  Hillary Bow, MD Inpatient           Consults  none  DVT Prophylaxis  Lovenox   Lab Results  Component Value Date   PLT 240 02/22/2017     Time Spent in minutes  35 minutes  Greater than 50% of time spent in care coordination and counseling patient regarding the condition and plan of care.   Dustin Flock M.D on 02/24/2017 at 1:54 PM  Between 7am to 6pm - Pager - [phone]  After 6pm go to [email or web address] - password [email or web address] [facility]   Office  [phone]

## 2017-02-25 LAB — CREATININE, SERUM
Creatinine, Ser: 0.55 mg/dL (ref 0.44–1.00)
GFR calc Af Amer: >60 mL/min (ref >60)
GFR calc non Af Amer: >60 mL/min (ref >60)

## 2017-02-25 LAB — CBC
HCT: 34.9 % — ABNORMAL LOW (ref 35.0–47.0)
Hemoglobin: 11.4 g/dL — ABNORMAL LOW (ref 12.0–16.0)
MCH: 28.8 pg (ref 26.0–34.0)
MCHC: 32.7 g/dL (ref 32.0–36.0)
MCV: 88.1 fL (ref 80.0–100.0)
PLATELETS: 192 10*3/uL (ref 150–440)
RBC: 3.96 MIL/uL (ref 3.80–5.20)
RDW: 13.3 % (ref 11.5–14.5)
WBC: 16.1 10*3/uL — ABNORMAL HIGH (ref 3.6–11.0)

## 2017-02-25 MED ORDER — BUDESONIDE 0.5 MG/2ML IN SUSP
0.5000 mg | Freq: Two times a day (BID) | RESPIRATORY_TRACT | Status: DC
  Administered 2017-02-25 – 2017-02-26 (×2): 0.5 mg via RESPIRATORY_TRACT
  Filled 2017-02-25 (×2): qty 2

## 2017-02-25 MED ORDER — METHYLPREDNISOLONE SODIUM SUCC 125 MG IJ SOLR
60.0000 mg | Freq: Two times a day (BID) | INTRAMUSCULAR | Status: DC
  Administered 2017-02-25 (×2): 60 mg via INTRAVENOUS
  Filled 2017-02-25 (×2): qty 2

## 2017-02-25 MED ORDER — PANTOPRAZOLE SODIUM 40 MG PO TBEC
40.0000 mg | DELAYED_RELEASE_TABLET | Freq: Every day | ORAL | Status: DC
  Administered 2017-02-25 – 2017-02-26 (×2): 40 mg via ORAL
  Filled 2017-02-25 (×2): qty 1

## 2017-02-25 NOTE — Progress Notes (Signed)
[place] at [facility]                                                                                                                                                                                  Patient Demographics   Michelle [name], is a 56 y.o. female, DOB - May 24, 1961, FIE:[id number]  Admit date - 02/22/2017   Admitting Physician Harrie Foreman, MD  Outpatient Primary MD for the patient is [facility]   LOS - 2  Subjective: Patient still short of breath states some improvement    Review of Systems:   CONSTITUTIONAL: No documented fever. No fatigue, weakness. No weight gain, no weight loss.  EYES: No blurry or double vision.  ENT: No tinnitus. No postnasal drip. No redness of the oropharynx.  RESPIRATORY: positive cough, positive wheeze, no hemoptysis.positive dyspnea.  CARDIOVASCULAR: No chest pain. No orthopnea. No palpitations. No syncope.  GASTROINTESTINAL: No nausea, no vomiting or diarrhea. No abdominal pain. No melena or hematochezia.  GENITOURINARY: No dysuria or hematuria.  ENDOCRINE: No polyuria or nocturia. No heat or cold intolerance.  HEMATOLOGY: No anemia. No bruising. No bleeding.  INTEGUMENTARY: No rashes. No lesions.  MUSCULOSKELETAL: No arthritis. No swelling. No gout.  NEUROLOGIC: No numbness, tingling, or ataxia. No seizure-type activity.  PSYCHIATRIC: No anxiety. No insomnia. No ADD.    Vitals:   Vitals:   02/24/17 1333 02/24/17 2018 02/25/17 0438 02/25/17 0500  BP: 131/73 (!) 173/100 126/80   Pulse: 73 90 64   Resp:  (!) 22 18   Temp: 98.6 F (37 C) 97.8 F (36.6 C) 98.1 F (36.7 C)   TempSrc: Oral Oral Oral   SpO2: 100% 100% 100%   Weight:    88 lb 4.8 oz (40.1 kg)  Height:        Wt Readings from Last 3 Encounters:  02/25/17 88 lb 4.8 oz (40.1 kg)  01/26/17 82 lb (37.2 kg)  01/01/17 81 lb 5.6 oz (36.9 kg)     Intake/Output Summary (Last 24 hours) at 02/25/17 1443 Last data filed at  02/25/17 0500  Gross per 24 hour  Intake              200 ml  Output                0 ml  Net              200 ml    Physical Exam:   GENERAL: Pleasant-appearing in no apparent distress.  HEAD, EYES, EARS, NOSE AND THROAT: Atraumatic, normocephalic. Extraocular muscles are intact. Pupils equal and reactive to light. Sclerae anicteric. No conjunctival injection. No oro-pharyngeal  erythema.  NECK: Supple. There is no jugular venous distention. No bruits, no lymphadenopathy, no thyromegaly.  HEART: Regular rate and rhythm,. No murmurs, no rubs, no clicks.  LUNGS: bilateral wheezing throughout both lungs with no  Accessory muscle usage ABDOMEN: Soft, flat, nontender, nondistended. Has good bowel sounds. No hepatosplenomegaly appreciated.  EXTREMITIES: No evidence of any cyanosis, clubbing, or peripheral edema.  +2 pedal and radial pulses bilaterally.  NEUROLOGIC: The patient is alert, awake, and oriented x3 with no focal motor or sensory deficits appreciated bilaterally.  SKIN: Moist and warm with no rashes appreciated.  Psych: Not anxious, depressed LN: No inguinal LN enlargement    Antibiotics   Anti-infectives    Start     Dose/Rate Route Frequency Ordered Stop   02/24/17 1000  azithromycin (ZITHROMAX) tablet 250 mg     250 mg Oral Daily 02/23/17 0241 02/28/17 0959   02/23/17 1000  azithromycin (ZITHROMAX) tablet 500 mg     500 mg Oral Daily 02/23/17 0241 02/23/17 0900      Medications   Scheduled Meds: . aspirin EC  81 mg Oral Daily  . azithromycin  250 mg Oral Daily  . budesonide (PULMICORT) nebulizer solution  0.5 mg Nebulization BID  . carvedilol  3.125 mg Oral BH-q7a  . Chlorhexidine Gluconate Cloth  6 each Topical Q0600  . docusate sodium  100 mg Oral BID  . enalapril  5 mg Oral Daily  . enoxaparin (LOVENOX) injection  30 mg Subcutaneous Q24H  . feeding supplement (ENSURE ENLIVE)  237 mL Oral BID BM  . guaiFENesin  600 mg Oral BID  . Influenza vac split quadrivalent  PF  0.5 mL Intramuscular Tomorrow-1000  . ipratropium-albuterol  3 mL Nebulization Q6H  . methylPREDNISolone (SOLU-MEDROL) injection  60 mg Intravenous Q12H  . mupirocin ointment  1 application Nasal BID  . nicotine  14 mg Transdermal Daily  . pantoprazole  40 mg Oral Daily  . simvastatin  20 mg Oral Daily  . tiotropium  18 mcg Inhalation Daily   Continuous Infusions:  PRN Meds:.acetaminophen **OR** acetaminophen, alum & mag hydroxide-simeth, guaiFENesin-dextromethorphan, hydrALAZINE, LORazepam, ondansetron **OR** ondansetron (ZOFRAN) IV, traMADol   Data Review:   Micro Results Recent Results (from the past 240 hour(s))  MRSA PCR Screening     Status: Abnormal   Collection Time: 02/24/17  1:58 PM  Result Value Ref Range Status   MRSA by PCR POSITIVE (A) NEGATIVE Final    Comment:        The GeneXpert MRSA Assay (FDA approved for NASAL specimens only), is one component of a comprehensive MRSA colonization surveillance program. It is not intended to diagnose MRSA infection nor to guide or monitor treatment for MRSA infections. RESULT CALLED TO, READ BACK BY AND VERIFIED WITH: Verdis Prime @ 2992 02/24/17 by [facility]     Radiology Reports Dg Chest 2 View  Result Date: 01/27/2017 CLINICAL DATA:  Dyspnea EXAM: CHEST  2 VIEW COMPARISON:  01/01/2017 FINDINGS: Heart size is normal. There is thoracic aortic atherosclerosis without aneurysm enveloping the arch. The lungs are markedly hyperinflated with attenuated pulmonary and interstitial lung markings consistent with COPD. No effusion or pneumonic consolidation. No overt pulmonary edema. No acute nor suspicious osseous abnormality. IMPRESSION: 1. Emphysematous hyperinflation of the lungs without acute pneumonic consolidation or CHF. 2. Aortic atherosclerosis. Electronically Signed   By: Ashley Royalty M.D.   On: 01/27/2017 00:17   Dg Chest Portable 1 View  Result Date: 02/22/2017 CLINICAL DATA:  Shortness of breath for  2 days.  History of COPD. Cough. History of congestive heart failure, hypertension, smoker. EXAM: PORTABLE CHEST 1 VIEW COMPARISON:  01/26/2017 FINDINGS: Heart size and pulmonary vascularity are normal. Prominent hyperinflation of the lungs likely representing emphysema. No airspace disease or consolidation. No blunting of costophrenic angles. No pneumothorax. Mediastinal contours appear intact. IMPRESSION: Emphysematous changes in the lungs. No evidence of active pulmonary disease. Electronically Signed   By: Lucienne Capers M.D.   On: 02/22/2017 23:14     CBC  Recent Labs Lab 02/22/17 2254 02/25/17 0324  WBC 13.4* 16.1*  HGB 15.2 11.4*  HCT 45.9 34.9*  PLT 240 192  MCV 87.7 88.1  MCH 29.0 28.8  MCHC 33.0 32.7  RDW 13.4 13.3    Chemistries   Recent Labs Lab 02/22/17 2254 02/24/17 0335 02/25/17 0324  NA 140 140  --   K 3.4* 4.2  --   CL 100* 109  --   CO2 29 26  --   GLUCOSE 123* 154*  --   BUN 24* 18  --   CREATININE 0.64 0.42* 0.55  CALCIUM 9.9 8.5*  --    ------------------------------------------------------------------------------------------------------------------ estimated creatinine clearance is 48.4 mL/min (by C-G formula based on SCr of 0.55 mg/dL). ------------------------------------------------------------------------------------------------------------------  Recent Labs  02/23/17 0317  HGBA1C 5.3   ------------------------------------------------------------------------------------------------------------------ No results for input(s): CHOL, HDL, LDLCALC, TRIG, CHOLHDL, LDLDIRECT in the last 72 hours. ------------------------------------------------------------------------------------------------------------------  Recent Labs  02/23/17 0317  TSH 0.216*   ------------------------------------------------------------------------------------------------------------------ No results for input(s): VITAMINB12, FOLATE, FERRITIN, TIBC, IRON, RETICCTPCT in the  last 72 hours.  Coagulation profile No results for input(s): INR, PROTIME in the last 168 hours.  No results for input(s): DDIMER in the last 72 hours.  Cardiac Enzymes  Recent Labs Lab 02/23/17 0317 02/23/17 0940 02/23/17 1426  TROPONINI 0.03* <0.03 <0.03   ------------------------------------------------------------------------------------------------------------------ Invalid input(s): POCBNP    Assessment & Plan   This is a 56 year old female admitted for respiratory failure with hypoxia. 1. Acute on chronicRespiratory failure:; with hypoxia.  On chronic oxygen therapy which we'll continue continue IV Solu-Medrol  continue Mucinex to current regimen 2. COPD: as above 3. CAD: Stable. .elevated troponin due to demand ischemia. Continue to monitor telemetry. Also continue aspirin. 4. Hypertension: Fluctuating; continue carvedilol and enalapril.  5. Hyperlipidemia: Continue statin therapy 6. DVT prophylaxis: Lovenox 7. GI prophylaxis: None  Hopefully discharge tomorrow     Code Status Orders        Start     Ordered   02/23/17 0242  Full code  Continuous     02/23/17 0241    Code Status History    Date Active Date Inactive Code Status Order ID Comments User Context   01/01/2017 11:44 AM 01/04/2017  1:54 PM Full Code [id number]  Bettey Costa, MD Inpatient   05/22/2016  9:55 PM 05/27/2016  4:45 PM Full Code [id number]  Fritzi Mandes, MD Inpatient   04/23/2016  8:20 PM 04/27/2016  6:48 PM Full Code [id number]  Hower, Aaron Mose, MD ED   02/07/2016  7:22 PM 02/10/2016  6:35 PM Full Code [id number]  Gladstone Lighter, MD Inpatient   11/29/2015  5:19 AM 12/01/2015  4:09 PM Full Code [id number]  Harrie Foreman, MD Inpatient   11/05/2015  9:11 AM 11/07/2015  4:06 PM Full Code [id number]  Hillary Bow, MD ED   10/24/2014  3:22 PM 10/27/2014  4:30 PM Full Code [id number]  Hillary Bow, MD Inpatient           Consults  none  DVT Prophylaxis  Lovenox   Lab Results  Component  Value Date   PLT 192 02/25/2017     Time Spent in minutes  28minutes  Greater than 50% of time spent in care coordination and counseling patient regarding the condition and plan of care.   Dustin Flock M.D on 02/25/2017 at 2:43 PM  Between 7am to 6pm - Pager - [phone]  After 6pm go to [email or web address] - password [email or web address] [facility]   Office  [phone]

## 2017-02-25 NOTE — Care Management (Signed)
Patient admitted with respiratory failure.  Patient uses 3 liters chronic O2 through [facility].  Patient states that her daughter will bring her portable tank at discharge.  PCP [facility]  Patient states that she obtained her medication at [facility] the previous admission, however she did not complete the application.  RNCM to provide patient with another application to [facility] prior to discharge.  RNCM following for discharge medication needs.

## 2017-02-26 MED ORDER — GUAIFENESIN ER 600 MG PO TB12: 600.0000 mg | ORAL_TABLET | Freq: Two times a day (BID) | ORAL | 0 refills | Status: DC

## 2017-02-26 MED ORDER — AZITHROMYCIN 250 MG PO TABS: ORAL_TABLET | ORAL | 0 refills | Status: DC

## 2017-02-26 MED ORDER — ALBUTEROL SULFATE HFA 108 (90 BASE) MCG/ACT IN AERS: 2.0000 | INHALATION_SPRAY | Freq: Four times a day (QID) | RESPIRATORY_TRACT | 2 refills | Status: DC | PRN

## 2017-02-26 MED ORDER — PREDNISONE 10 MG (21) PO TBPK: ORAL_TABLET | ORAL | 0 refills | Status: DC

## 2017-02-26 NOTE — Discharge Instructions (Signed)
[place] [facility] [place]:  Cardiac diet  DISCHARGE CONDITION:  Stable  ACTIVITY:  Activity as tolerated  OXYGEN:  Home Oxygen: Yes.     Oxygen Delivery: 2 liters/min via Patient connected to nasal cannula oxygen  DISCHARGE LOCATION:  home    ADDITIONAL DISCHARGE INSTRUCTION: stop smoking   If you experience worsening of your admission symptoms, develop shortness of breath, life threatening emergency, suicidal or homicidal thoughts you must seek medical attention immediately by calling 911 or calling your MD immediately  if symptoms less severe.  You Must read complete instructions/literature along with all the possible adverse reactions/side effects for all the Medicines you take and that have been prescribed to you. Take any new Medicines after you have completely understood and accpet all the possible adverse reactions/side effects.   Please note  You were cared for by a hospitalist during your hospital stay. If you have any questions about your discharge medications or the care you received while you were in the hospital after you are discharged, you can call the unit and asked to speak with the hospitalist on call if the hospitalist that took care of you is not available. Once you are discharged, your primary care physician will handle any further medical issues. Please note that NO REFILLS for any discharge medications will be authorized once you are discharged, as it is imperative that you return to your primary care physician (or establish a relationship with a primary care physician if you do not have one) for your aftercare needs so that they can reassess your need for medications and monitor your lab values.

## 2017-02-26 NOTE — Discharge Summary (Signed)
[facility] - [place] at [facility], 56 y.o., DOB 1960-09-04, MRN [id number]. Admission date: 02/22/2017 Discharge Date 02/26/2017 Primary MD [facility] [place] Admitting Physician Harrie Foreman, MD  Admission Diagnosis  Acute respiratory distress [R06.03] COPD exacerbation [facility]) [J44.1]  Discharge Diagnosis   Active Problems:   Acute on chronic respiratory failure with hypoxia (HCC)  acute on chronic COPD exasperation Coronary artery disease Essential hypertension Hyperlipidemia Nicotine abuse       Hospital Course patient is a 56 year old with history of COPD with chronic respiratory failure presenting with shortness of breath cough. She was treated with nebulizers and steroids and antibiotics for acute on chronic COPD exasperation. Her respiratory symptoms have improved. She is currently stable for discharge.            Consults  None  Significant Tests:  See full reports for all details     Dg Chest Portable 1 View  Result Date: 02/22/2017 CLINICAL DATA:  Shortness of breath for 2 days. History of COPD. Cough. History of congestive heart failure, hypertension, smoker. EXAM: PORTABLE CHEST 1 VIEW COMPARISON:  01/26/2017 FINDINGS: Heart size and pulmonary vascularity are normal. Prominent hyperinflation of the lungs likely representing emphysema. No airspace disease or consolidation. No blunting of costophrenic angles. No pneumothorax. Mediastinal contours appear intact. IMPRESSION: Emphysematous changes in the lungs. No evidence of active pulmonary disease. Electronically Signed   By: Lucienne Capers M.D.   On: 02/22/2017 23:14       Today   Subjective:   Michelle [name]  Breathing improved  Objective:   Blood pressure (!) 152/99, pulse 74, temperature 97.9 F (36.6 C), temperature source Oral, resp. rate 18, height 4\' 9"  (1.448 m), weight 92 lb 9.6 oz (42 kg), last menstrual period 03/06/2005, SpO2 98 %.   .  Intake/Output Summary (Last 24 hours) at 02/26/17 1620 Last data filed at 02/26/17 0540  Gross per 24 hour  Intake              118 ml  Output                0 ml  Net              118 ml    Exam VITAL SIGNS: Blood pressure (!) 152/99, pulse 74, temperature 97.9 F (36.6 C), temperature source Oral, resp. rate 18, height 4\' 9"  (1.448 m), weight 92 lb 9.6 oz (42 kg), last menstrual period 03/06/2005, SpO2 98 %.  GENERAL:  56 y.o.-year-old patient lying in the bed with no acute distress.  EYES: Pupils equal, round, reactive to light and accommodation. No scleral icterus. Extraocular muscles intact.  HEENT: Head atraumatic, normocephalic. Oropharynx and nasopharynx clear.  NECK:  Supple, no jugular venous distention. No thyroid enlargement, no tenderness.  LUNGS: Normal breath sounds bilaterally, no wheezing, rales,rhonchi or crepitation. No use of accessory muscles of respiration.  CARDIOVASCULAR: S1, S2 normal. No murmurs, rubs, or gallops.  ABDOMEN: Soft, nontender, nondistended. Bowel sounds present. No organomegaly or mass.  EXTREMITIES: No pedal edema, cyanosis, or clubbing.  NEUROLOGIC: Cranial nerves II through XII are intact. Muscle strength 5/5 in all extremities. Sensation intact. Gait not checked.  PSYCHIATRIC: The patient is alert and oriented x 3.  SKIN: No obvious rash, lesion, or ulcer.   Data Review     CBC w Diff: Lab Results  Component Value Date   WBC 16.1 (H) 02/25/2017   HGB 11.4 (L) 02/25/2017   HGB 12.4  07/11/2014   HCT 34.9 (L) 02/25/2017   HCT 38.9 07/11/2014   PLT 192 02/25/2017   PLT 251 07/11/2014   LYMPHOPCT 23 01/26/2017   LYMPHOPCT 7.3 07/11/2014   MONOPCT 11 01/26/2017   MONOPCT 8.3 07/11/2014   EOSPCT 3 01/26/2017   EOSPCT 0.2 07/11/2014   BASOPCT 1 01/26/2017   BASOPCT 0.3 07/11/2014   CMP: Lab Results  Component Value Date   NA 140 02/24/2017   NA 139 07/11/2014   K 4.2 02/24/2017   K 4.6 07/11/2014   CL 109 02/24/2017   CL  104 07/11/2014   CO2 26 02/24/2017   CO2 30 07/11/2014   BUN 18 02/24/2017   BUN 18 07/11/2014   CREATININE 0.55 02/25/2017   CREATININE 0.80 07/11/2014   PROT 6.8 01/26/2017   PROT 8.4 (H) 07/09/2014   ALBUMIN 4.1 01/26/2017   ALBUMIN 3.7 07/09/2014   BILITOT 0.2 (L) 01/26/2017   BILITOT 0.3 07/09/2014   ALKPHOS 51 01/26/2017   ALKPHOS 89 07/09/2014   AST 19 01/26/2017   AST 22 07/09/2014   ALT 17 01/26/2017   ALT 20 07/09/2014  .  Micro Results Recent Results (from the past 240 hour(s))  MRSA PCR Screening     Status: Abnormal   Collection Time: 02/24/17  1:58 PM  Result Value Ref Range Status   MRSA by PCR POSITIVE (A) NEGATIVE Final    Comment:        The GeneXpert MRSA Assay (FDA approved for NASAL specimens only), is one component of a comprehensive MRSA colonization surveillance program. It is not intended to diagnose MRSA infection nor to guide or monitor treatment for MRSA infections. RESULT CALLED TO, READ BACK BY AND VERIFIED WITH: Verdis Prime @ 3825 02/24/17 by [facility]         Code Status Orders        Start     Ordered   02/23/17 0242  Full code  Continuous     02/23/17 0241    Code Status History    Date Active Date Inactive Code Status Order ID Comments User Context   01/01/2017 11:44 AM 01/04/2017  1:54 PM Full Code [id number]  Bettey Costa, MD Inpatient   05/22/2016  9:55 PM 05/27/2016  4:45 PM Full Code [id number]  Fritzi Mandes, MD Inpatient   04/23/2016  8:20 PM 04/27/2016  6:48 PM Full Code [id number]  Hower, Aaron Mose, MD ED   02/07/2016  7:22 PM 02/10/2016  6:35 PM Full Code [id number]  Gladstone Lighter, MD Inpatient   11/29/2015  5:19 AM 12/01/2015  4:09 PM Full Code [id number]  Harrie Foreman, MD Inpatient   11/05/2015  9:11 AM 11/07/2015  4:06 PM Full Code [id number]  Hillary Bow, MD ED   10/24/2014  3:22 PM 10/27/2014  4:30 PM Full Code [id number]  Hillary Bow, [place], [facility]. Go on 03/05/2017.   Specialty:  General Practice Why:  Tuesday at 1:10pm for hospital follow-up Contact information: [place] [address] [phone]           Discharge Medications   Allergies as of 02/26/2017      Reactions   Morphine And Related    Family reports hallucinations and POA reports she doesn't want pt taking it      Medication List    TAKE these medications   albuterol 108 (90 Base) MCG/ACT inhaler  Commonly known as:  PROVENTIL HFA;VENTOLIN HFA Inhale 2 puffs into the lungs every 6 (six) hours as needed for wheezing or shortness of breath. What changed:  Another medication with the same name was removed. Continue taking this medication, and follow the directions you see here.   aspirin EC 81 MG tablet Take 81 mg by mouth daily.   azithromycin 250 MG tablet Commonly known as:  ZITHROMAX One tab daily x 2 days   carvedilol 3.125 MG tablet Commonly known as:  COREG Take 1 tablet (3.125 mg total) by mouth every morning. What changed:  when to take this   enalapril 10 MG tablet Commonly known as:  VASOTEC Take 0.5 tablets (5 mg total) by mouth daily.   feeding supplement (ENSURE ENLIVE) Liqd Take 237 mLs by mouth 2 (two) times daily between meals.   guaiFENesin 600 MG 12 hr tablet Commonly known as:  MUCINEX Take 1 tablet (600 mg total) by mouth 2 (two) times daily.   guaiFENesin-dextromethorphan 100-10 MG/5ML syrup Commonly known as:  ROBITUSSIN DM Take 5 mLs by mouth every 4 (four) hours as needed for cough (chest congestion).   ipratropium-albuterol 0.5-2.5 (3) MG/3ML Soln Commonly known as:  DUONEB Take 3 mLs by nebulization every 6 (six) hours as needed.   LORazepam 1 MG tablet Commonly known as:  ATIVAN Take 1 tablet (1 mg total) by mouth 2 (two) times daily as needed for anxiety.   mometasone-formoterol 200-5 MCG/ACT Aero Commonly known as:  DULERA Inhale 2 puffs into the lungs 2 (two) times daily.    nicotine 14 mg/24hr patch Commonly known as:  NICODERM CQ - dosed in mg/24 hours Place 1 patch (14 mg total) onto the skin daily.   predniSONE 10 MG (21) Tbpk tablet Commonly known as:  STERAPRED UNI-PAK 21 TAB Start at 60mg  taper by 10mg  until complete   tiotropium 18 MCG inhalation capsule Commonly known as:  SPIRIVA Place 1 capsule (18 mcg total) into inhaler and inhale daily.            Discharge Care Instructions        Start     Ordered   02/26/17 0000  albuterol (PROVENTIL HFA;VENTOLIN HFA) 108 (90 Base) MCG/ACT inhaler  Every 6 hours PRN     02/26/17 1031   02/26/17 0000  predniSONE (STERAPRED UNI-PAK 21 TAB) 10 MG (21) TBPK tablet     02/26/17 1031   02/26/17 0000  guaiFENesin (MUCINEX) 600 MG 12 hr tablet  2 times daily     02/26/17 1031   02/26/17 0000  azithromycin (ZITHROMAX) 250 MG tablet     02/26/17 1031         Total Time in preparing paper work, data evaluation and todays exam - 35 minutes  Dustin Flock M.D on 02/26/2017 at 4:20 PM  [facility]   Office  [phone]

## 2017-02-26 NOTE — Progress Notes (Signed)
Patient discharge teaching given, including activity, diet, follow-up appoints, and medications. Patient verbalized understanding of all discharge instructions. IV access was d/c'd last night. Vitals are stable. Skin is intact except as charted in most recent assessments. Pt to be escorted out by volunteer, to be driven home by family.  Deeya Richeson [employer]

## 2017-02-26 NOTE — Care Management (Signed)
Patient provided with another application to [facility] and [place], as well as "[employer] [facility]:  Your Guide to [employer] and [employer] in [facility]"  Booklet.  Scripts faxed to [facility] for antibiotic, steroids, and inhaler. Patient will have to obtain guaifenesin at another pharmacy.  RNCM signing off.

## 2017-03-13 IMAGING — DX DG CHEST 1V PORT
1 series · 1 of 1 positions shown · non-contrast
Comparison: 11/05/2015

CLINICAL DATA: Dyspnea for 1 day.

EXAM:
PORTABLE CHEST 1 VIEW

[chest ap]
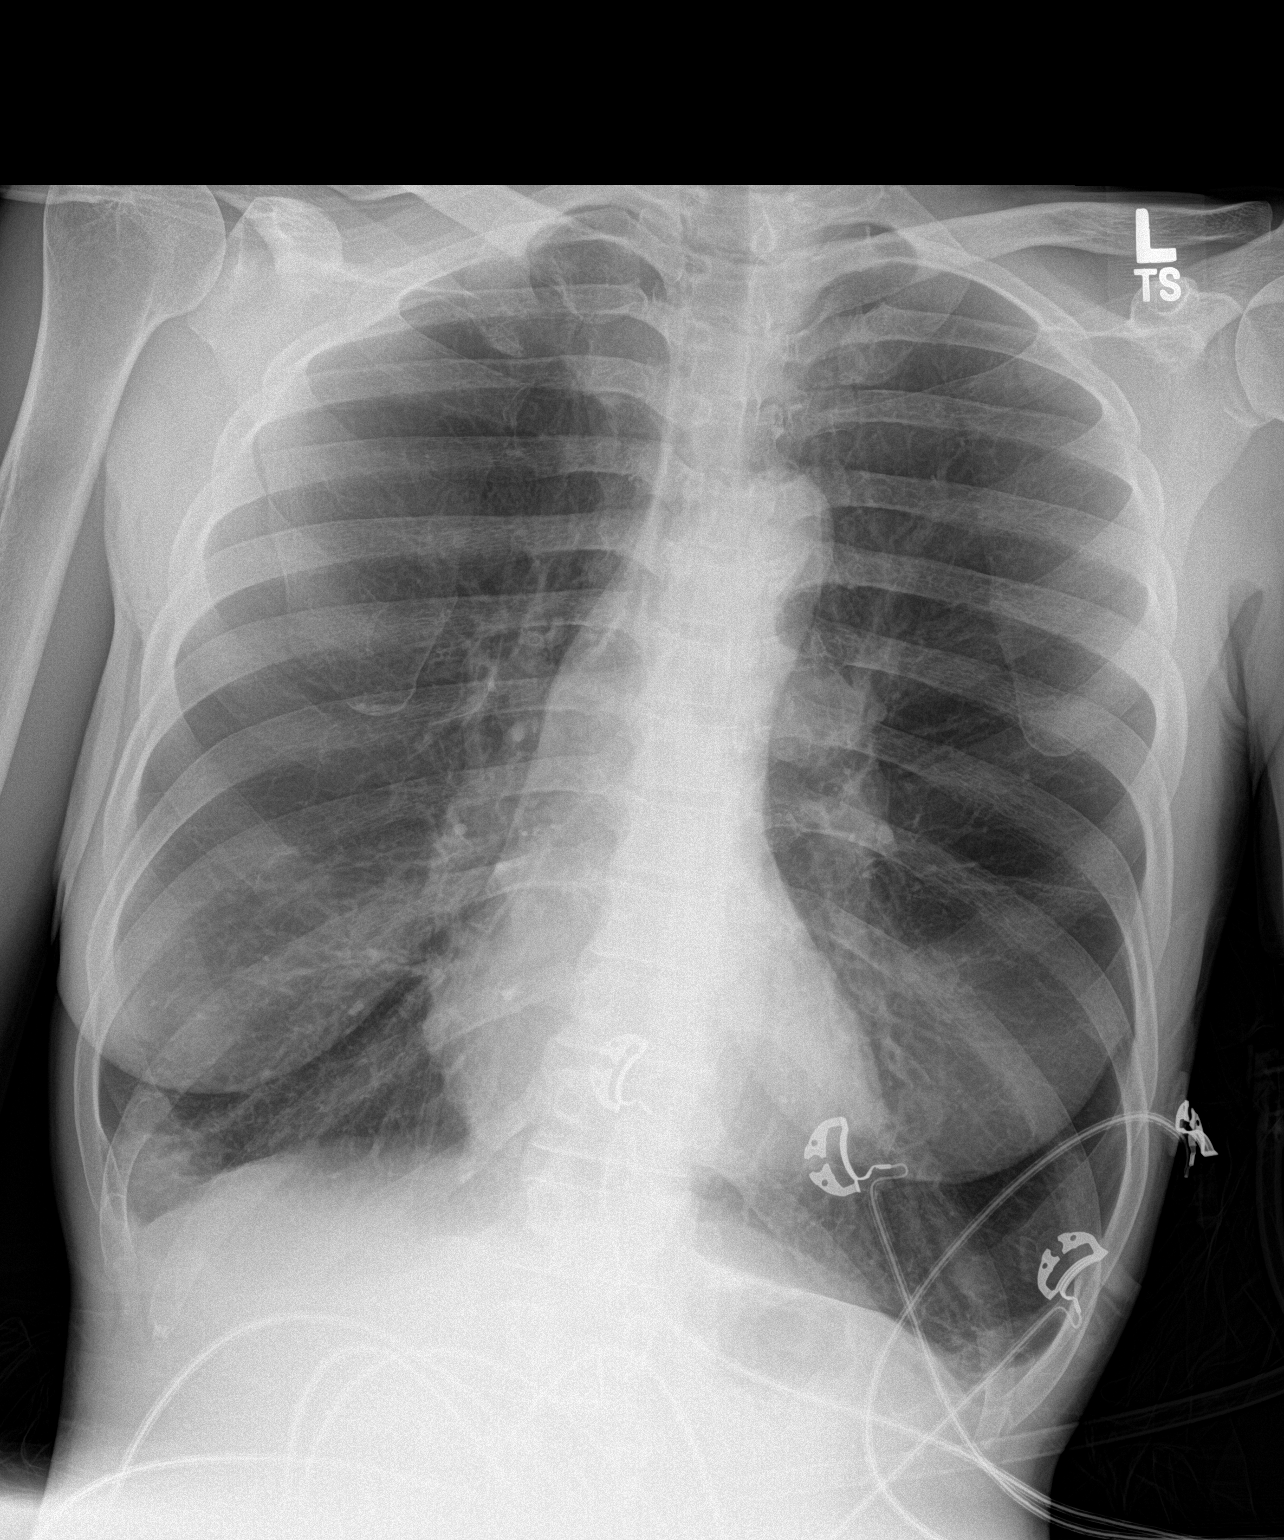

[1 of 1 positions shown; findings below may reference images not displayed]

FINDINGS: Marked hyperinflation. Mild patchy opacity in the right lateral base
may represent infectious infiltrate or aspiration. No pneumothorax.
Normal pulmonary vasculature. Unremarkable hilar and mediastinal
contours.
IMPRESSION: Mild patchy opacity in the lateral right lung base, superimposed on
severe hyperinflation. Cannot exclude infectious infiltrate.

## 2017-03-14 IMAGING — DX DG CHEST 1V
1 series · 1 of 1 positions shown · non-contrast
Comparison: 11/29/2015

CLINICAL DATA: Shortness of Breath

EXAM:
CHEST 1 VIEW

[chest ap]
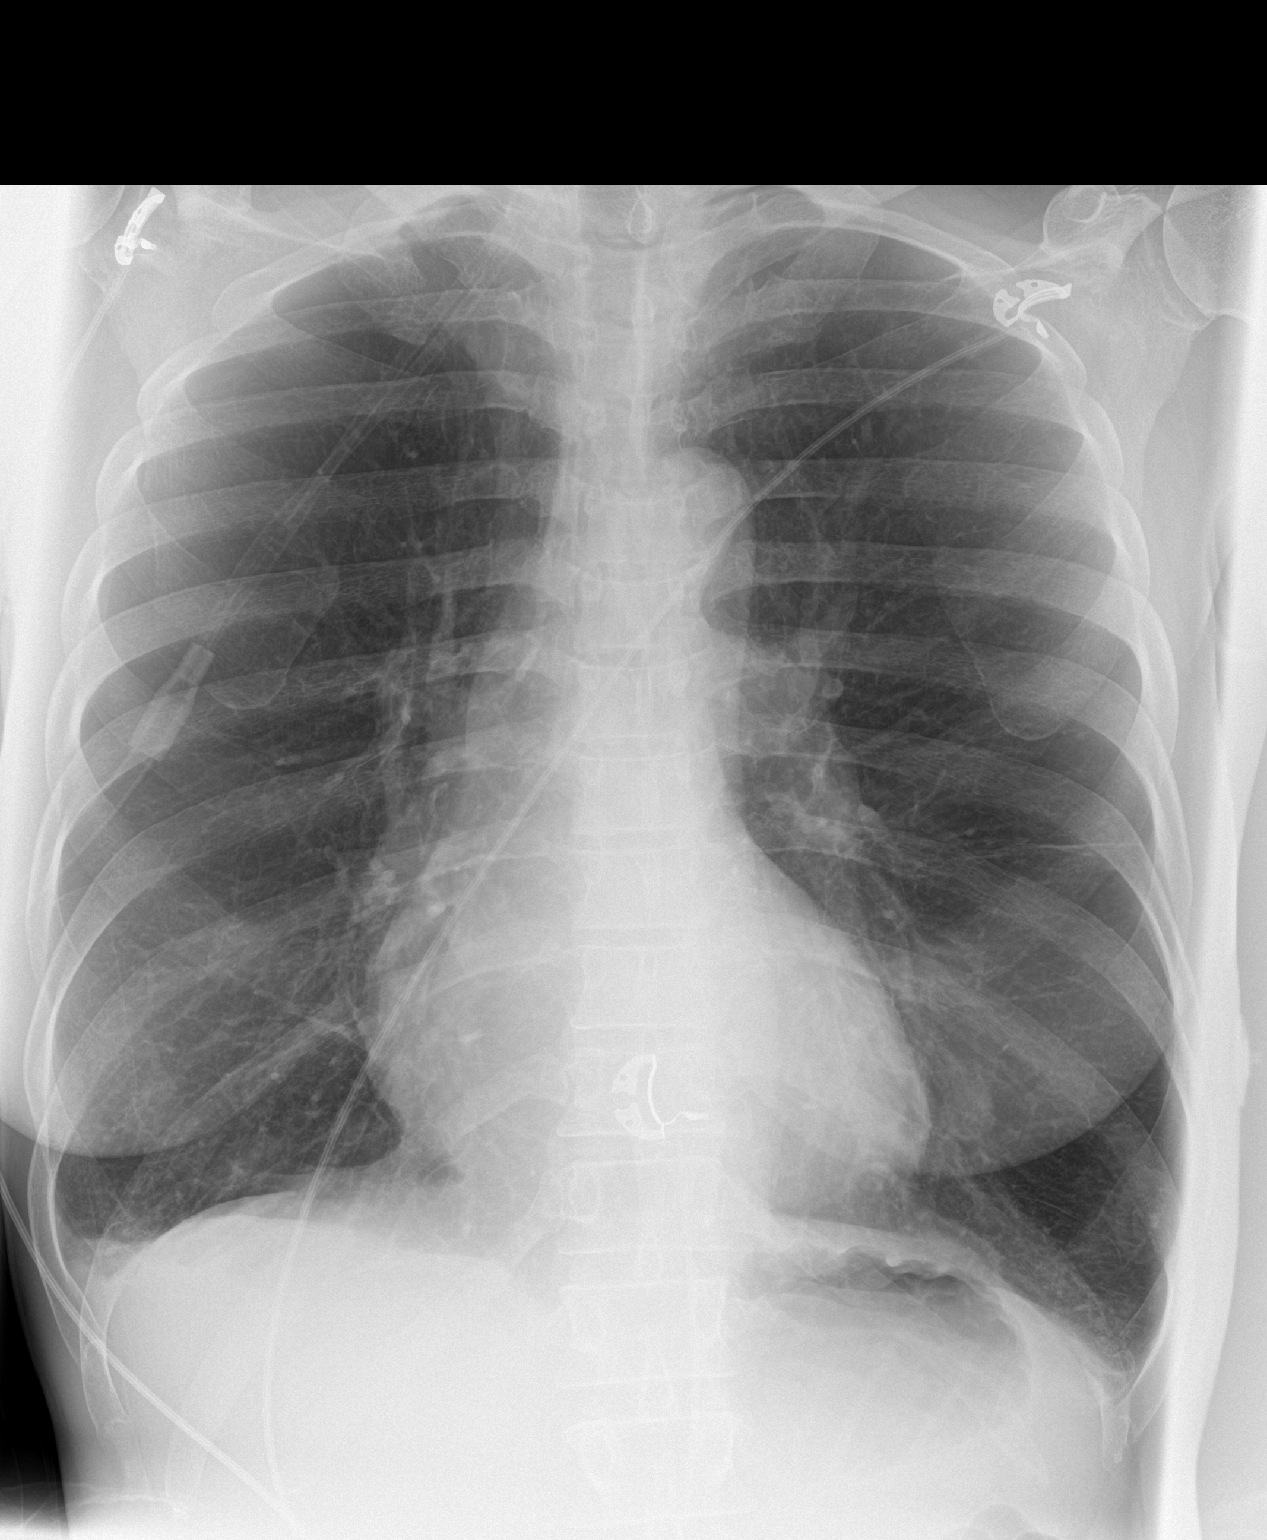

[1 of 1 positions shown; findings below may reference images not displayed]

FINDINGS: Cardiomediastinal silhouette is stable. Hyperinflation again noted.
No infiltrate or pulmonary edema. Minimal pleural fluid or pleural
thickening in right costophrenic angle with improvement in aeration.
IMPRESSION: No infiltrate or pulmonary edema. Minimal pleural fluid or pleural
thickening in right costophrenic angle with improvement in aeration.
Hyperinflation again noted.

## 2017-03-15 IMAGING — CT CT ANGIO CHEST
2 of 7 series · 18 of 46 positions shown · IV contrast (isovue)
Comparison: Chest x-ray 11/30/2015

CLINICAL DATA: Pleuritic pain. Chronic respiratory failure.
Hypoxia. Shortness of breath and wheeze seen. History of COPD,
hypertension, smoking.

EXAM:
CT ANGIOGRAPHY CHEST WITH CONTRAST
TECHNIQUE: Multidetector CT imaging of the chest was performed using the
standard protocol during bolus administration of intravenous
contrast. Multiplanar CT image reconstructions and MIPs were
obtained to evaluate the vascular anatomy.
CONTRAST:  75 cc Isovue 370

[Series 10: pe thins 1.5 · axial · 0.68mm/px · z∈[-566,-303]mm · 15 of 251 slices shown]
[im 16/251  lung]
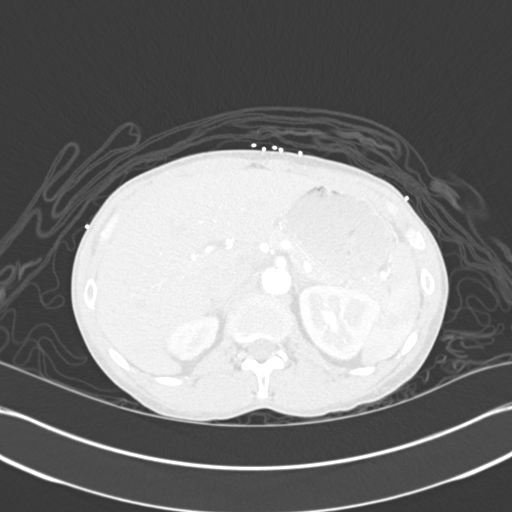
[im 32/251  soft-tissue]
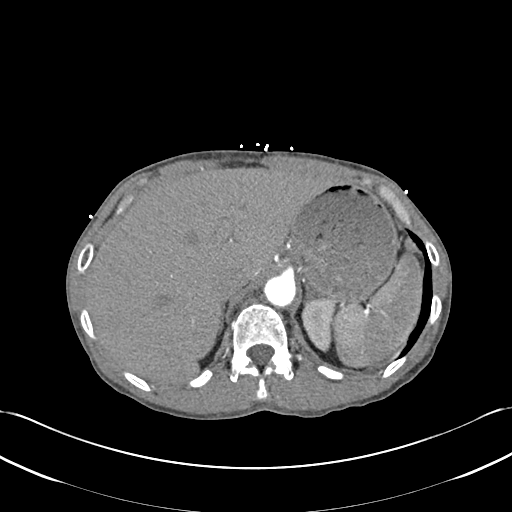
[im 47/251  lung]
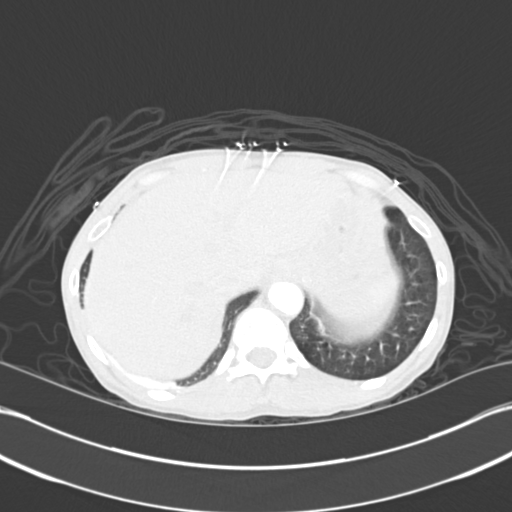
[im 63/251  soft-tissue]
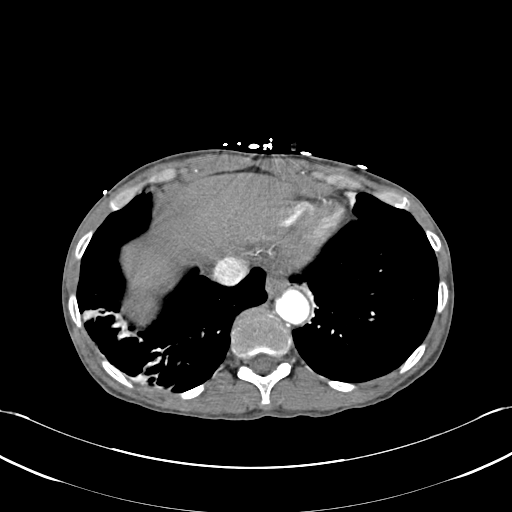
[im 79/251  lung]
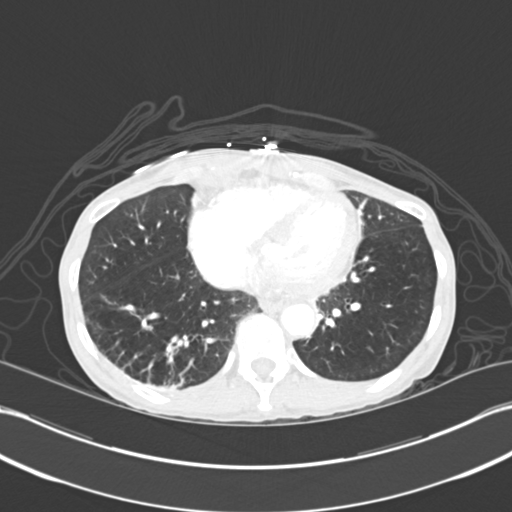
[im 94/251  soft-tissue]
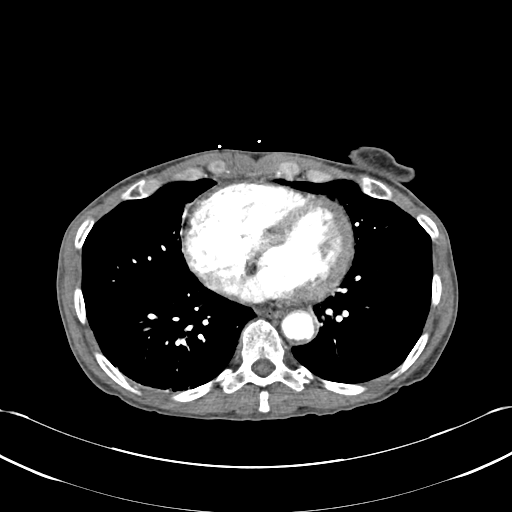
[im 110/251  lung]
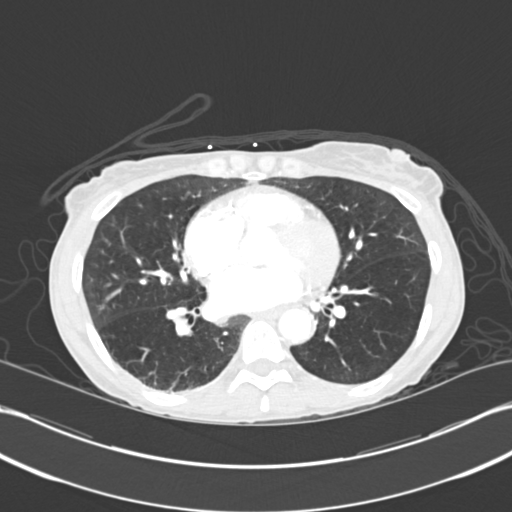
[im 126/251  soft-tissue]
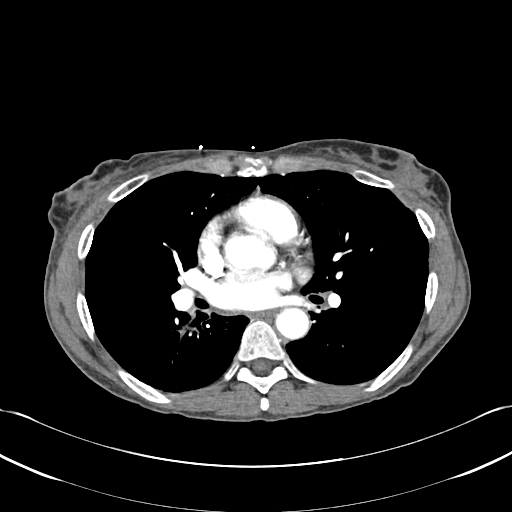
[im 141/251  lung]
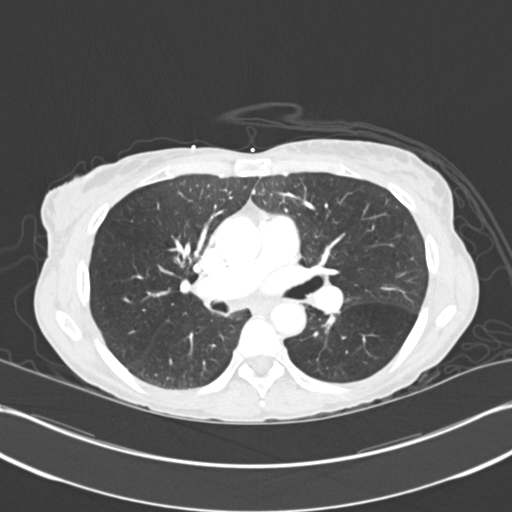
[im 157/251  soft-tissue]
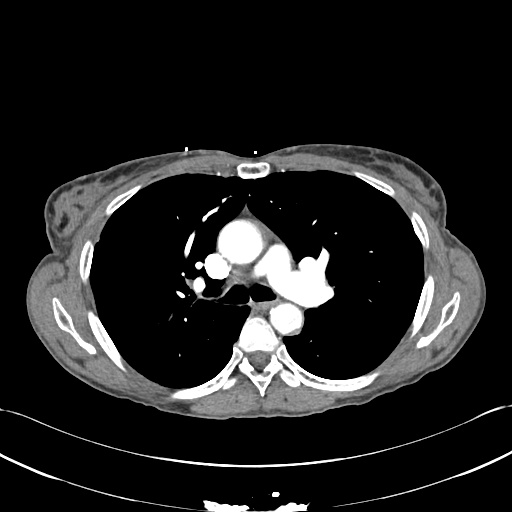
[im 172/251  lung]
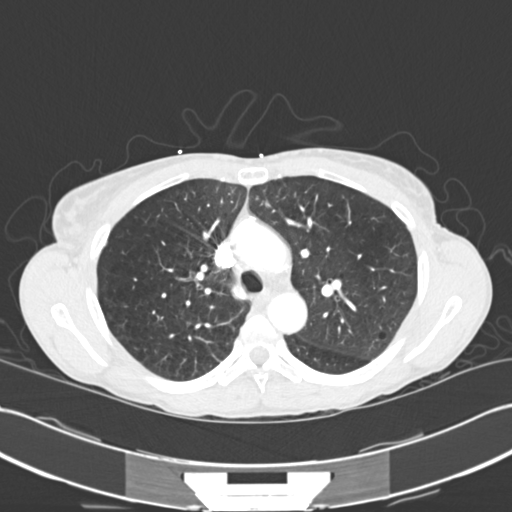
[im 188/251  soft-tissue]
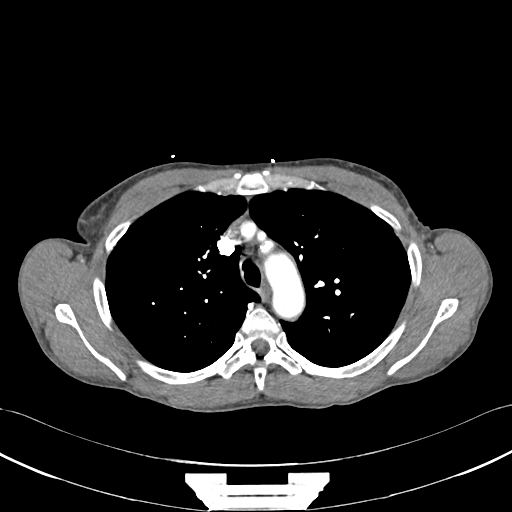
[im 204/251  lung]
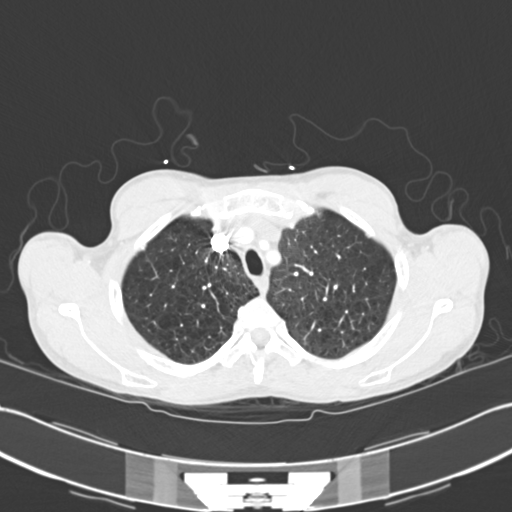
[im 219/251  soft-tissue]
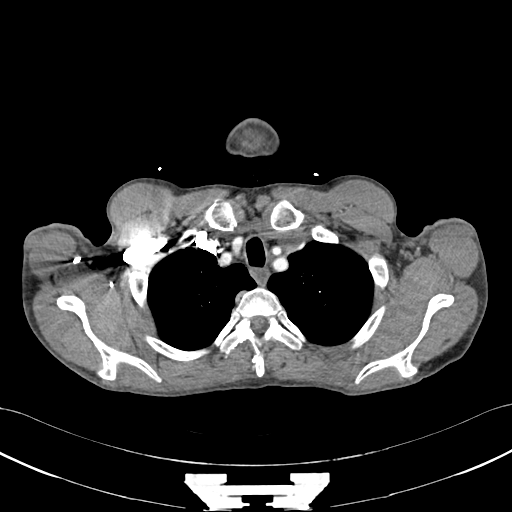
[im 235/251  lung]
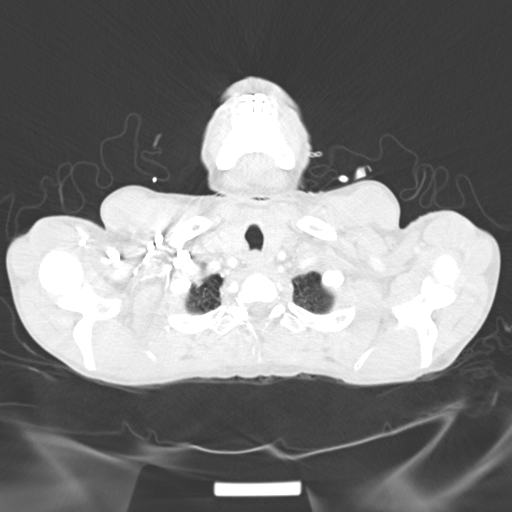

[Series 12: cor mpr 2.0 · coronal · 0.56mm/px · 3 of 98 slices shown]
[im 25/98  soft-tissue]
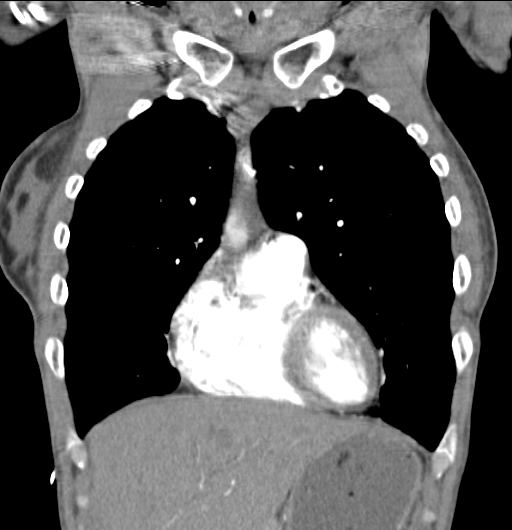
[im 49/98  soft-tissue]
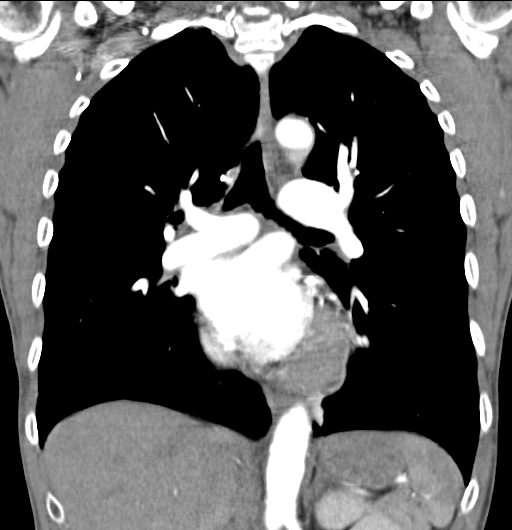
[im 73/98  soft-tissue]
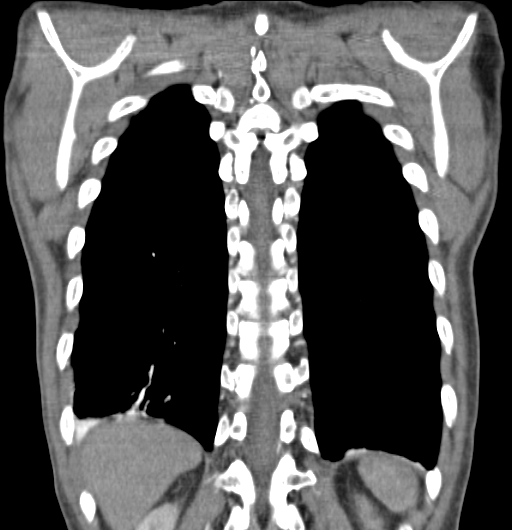

[18 of 46 positions shown; findings below may reference images not displayed]

FINDINGS: Cardiovascular: Coronary stents are visible. Moderate
atherosclerosis of the coronary arteries. Heart size is normal. No
pericardial effusion. There is atherosclerotic calcification of the
thoracic aorta which is mildly tortuous but not aneurysmal.

The pulmonary arteries are well opacified. There is no acute
pulmonary embolus.

Mediastinum/Nodes: The visualized portion of the thyroid gland has a
normal appearance. No mediastinal, hilar, or axillary adenopathy.
The esophagus has a normal appearance.

Lungs/Pleura: Right lower lobe atelectasis is present. No pleural
effusions or suspicious pulmonary nodules. Airways are patent.

Upper Abdomen: Unremarkable.

Musculoskeletal: Unremarkable.

Review of the MIP images confirms the above findings.
IMPRESSION: 1. Technically adequate exam showing no acute pulmonary embolus.
2. Coronary artery disease and coronary artery stents.
3. Right lower lobe atelectasis.

## 2017-04-17 ENCOUNTER — Telehealth: Payer: Self-pay | Admitting: Pharmacy Technician

## 2017-04-17 NOTE — Telephone Encounter (Signed)
[facility] filled initial prescription.  Patient was given new patient packet.  Patient never returned information or scheduled and eligibility appointment.  [facility] unable to provide additional medication assistance until eligibility is determined.  Betty J. Kluttz Care Manager [facility] 

## 2017-05-22 IMAGING — DX DG CHEST 1V PORT
1 series · 1 of 1 positions shown · non-contrast
Comparison: CT 12/01/2015.  Chest x-ray 11/30/2015.

CLINICAL DATA: Difficulty breathing.

EXAM:
PORTABLE CHEST 1 VIEW

[chest ap]
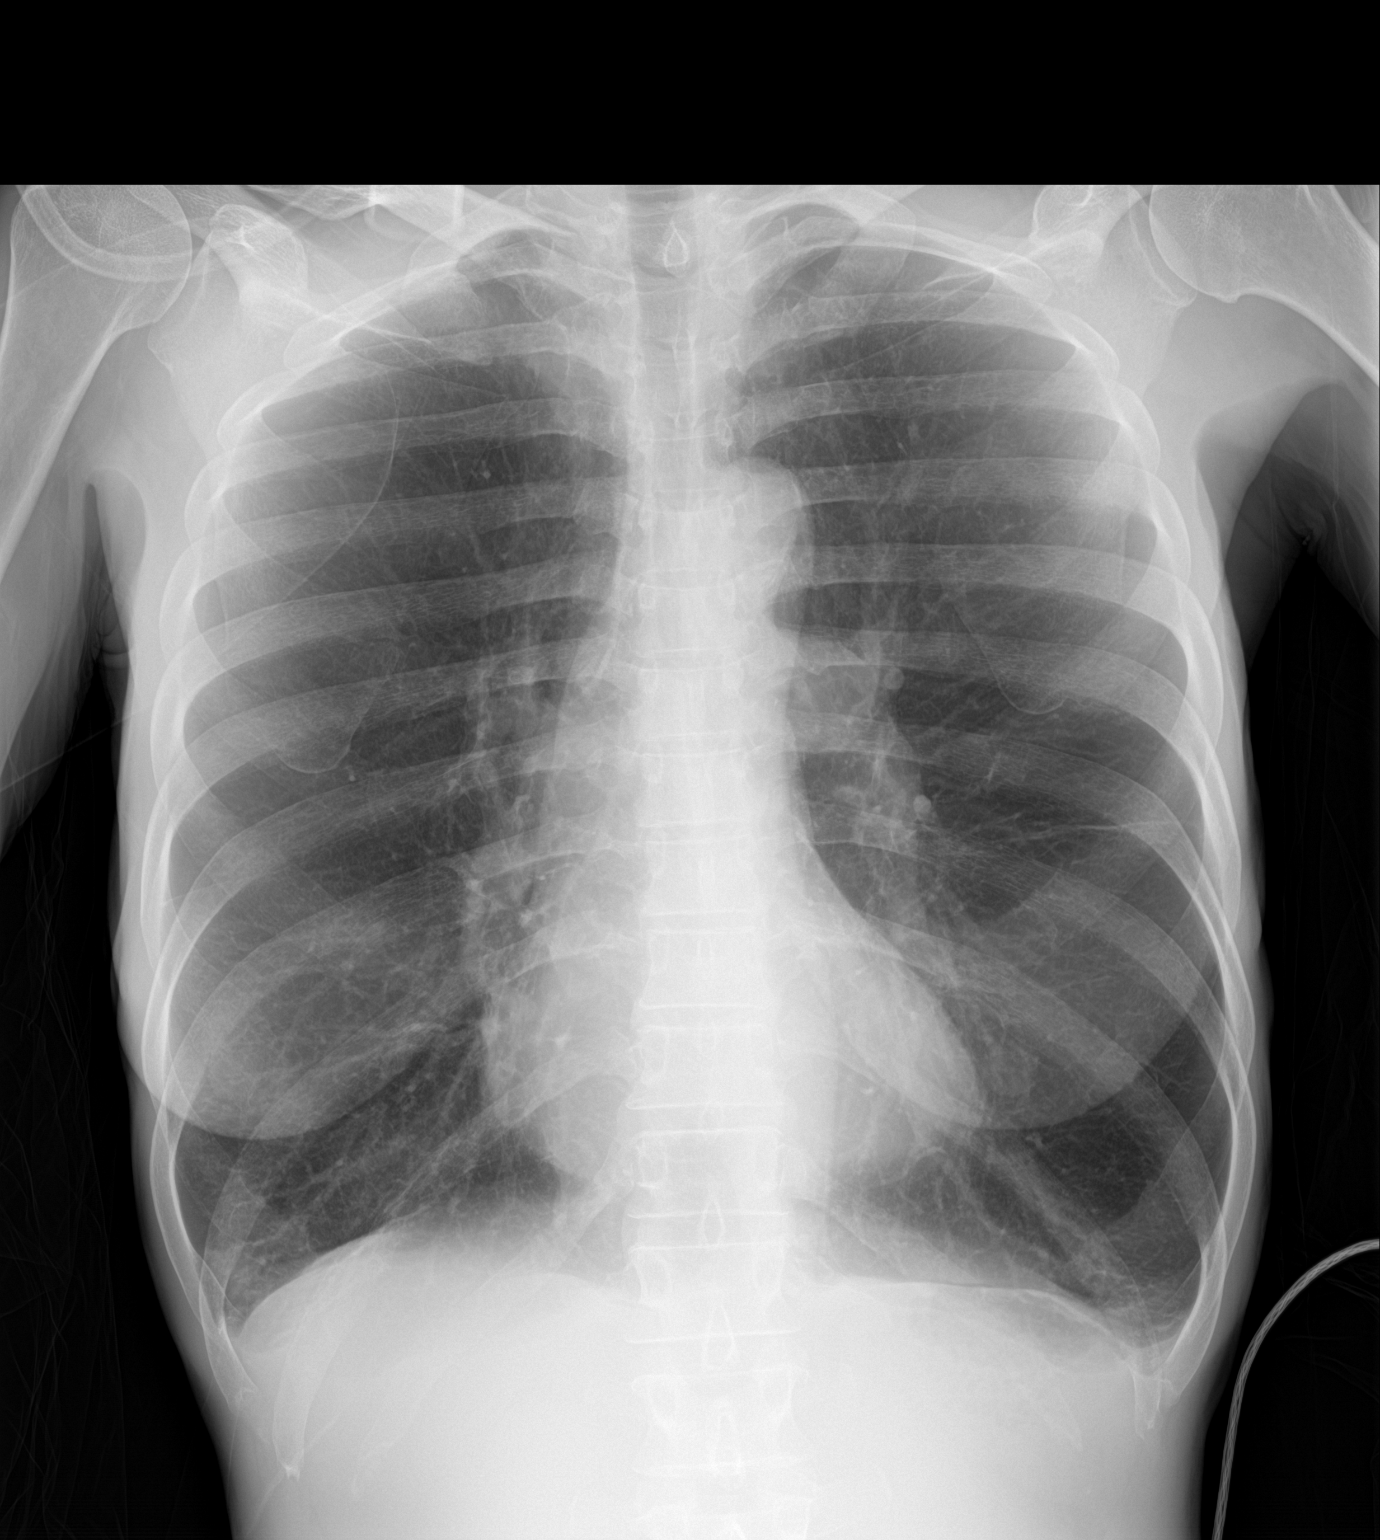

[1 of 1 positions shown; findings below may reference images not displayed]

FINDINGS: Mediastinum and hilar structures are normal. Mild bibasilar
atelectasis and/or scarring. Pleural parenchymal thickening again
noted consistent with scarring . COPD cannot be excluded. Heart size
stable. No acute bony abnormality P
IMPRESSION: Mild bibasilar subsegmental atelectasis and/or scarring. Pleural
parenchymal scarring. COPD cannot be excluded. No acute pulmonary
abnormality identified.

## 2017-05-23 IMAGING — CR DG HIP (WITH OR WITHOUT PELVIS) 2-3V*R*
1 series · 3 of 3 positions shown · non-contrast
Comparison: None.

CLINICAL DATA: Right hip pain.

EXAM:
DG HIP (WITH OR WITHOUT PELVIS) 2-3V RIGHT

[Series 1: dg hip unilat w or w/o pelvis 2-3 views  · non-contrast · 0.14mm/px · 3 of 3 slices shown]
[im 1/3]
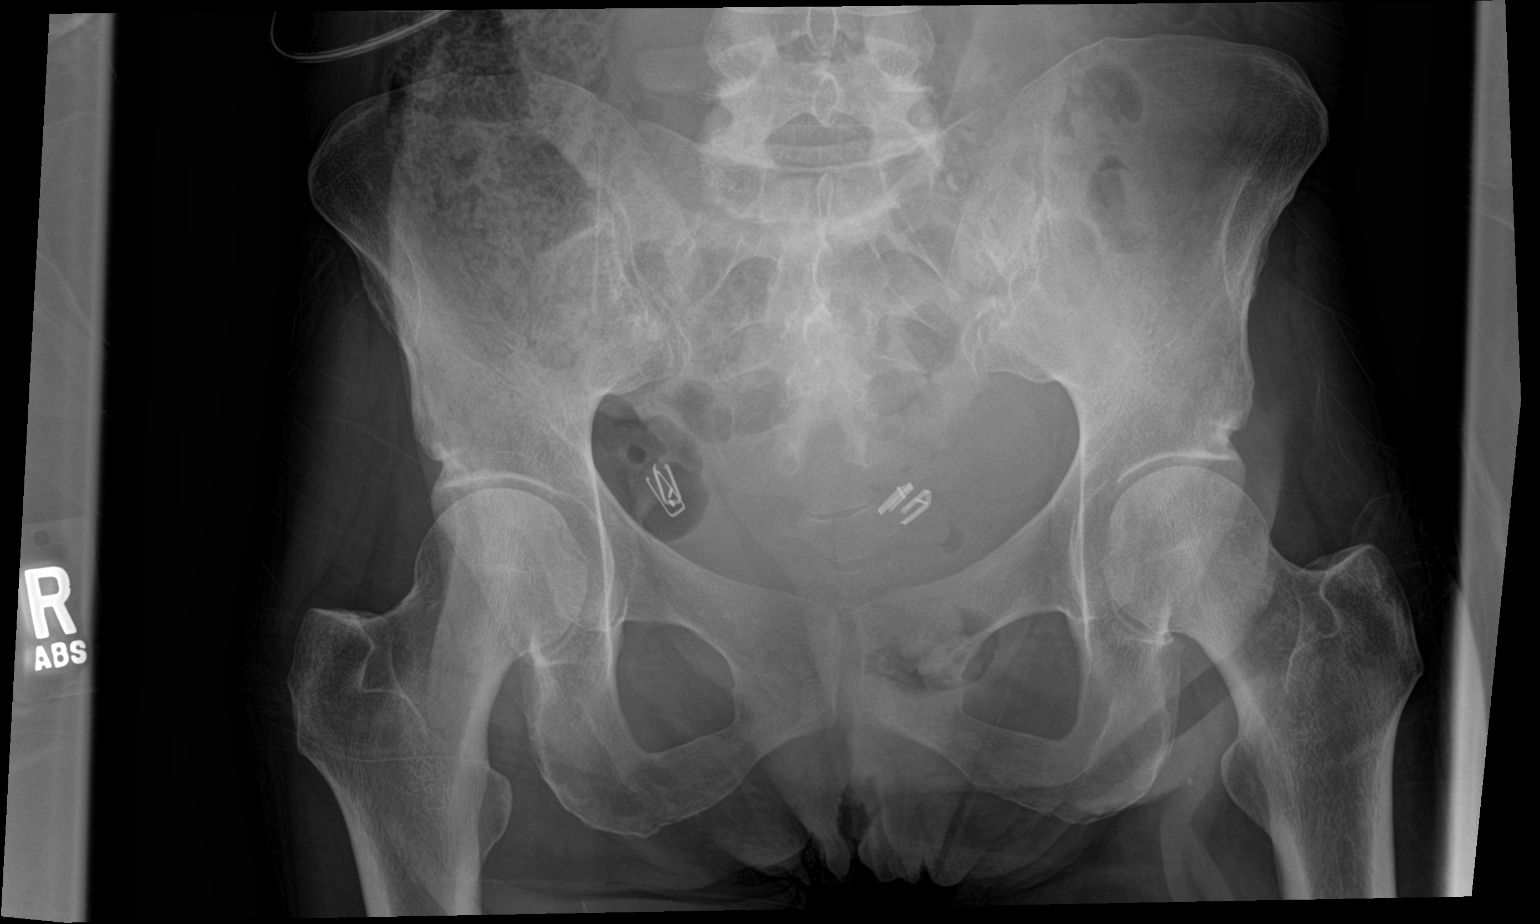
[im 2/3]
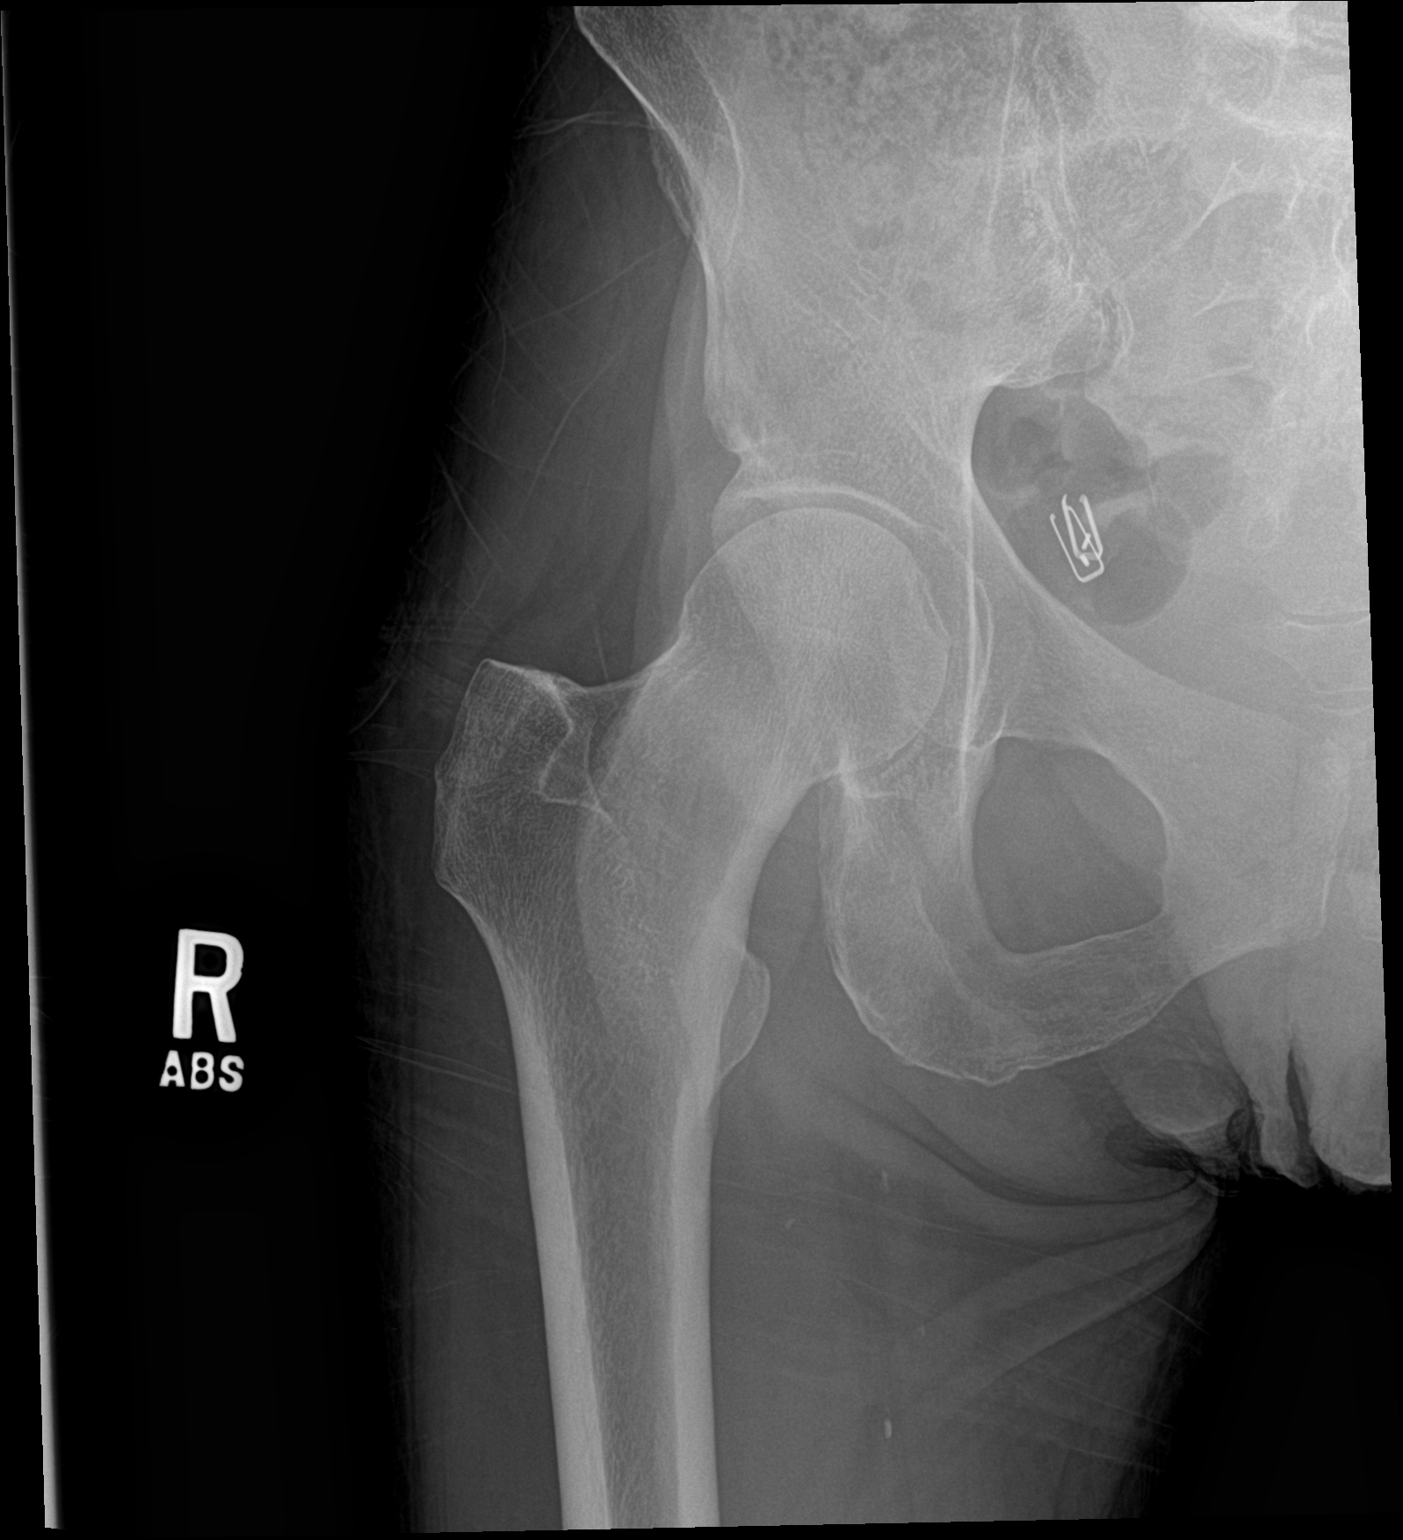
[im 3/3]
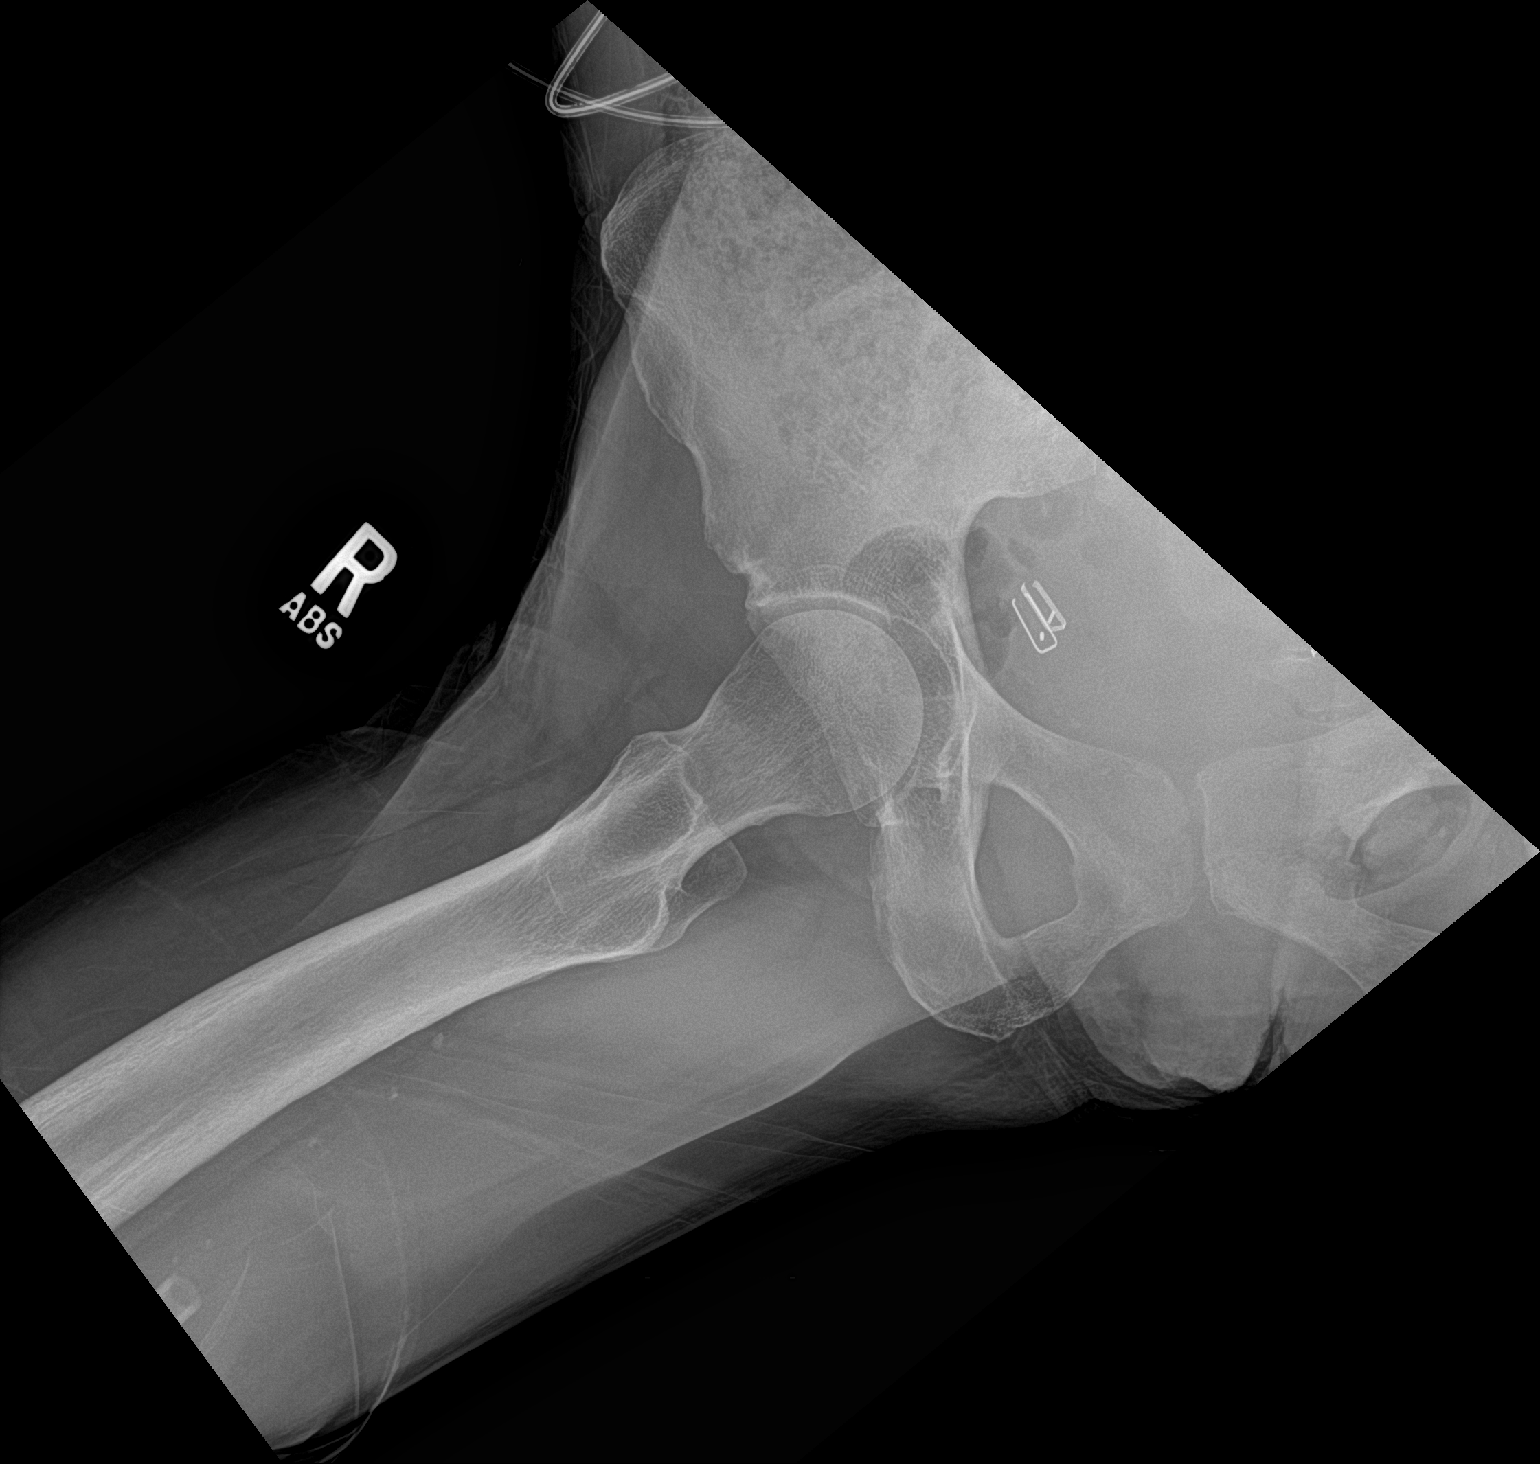

[3 of 3 positions shown; findings below may reference images not displayed]

FINDINGS: Mild hip joint narrowing bilaterally. SI joints are symmetric and
unremarkable. No acute bony abnormality. Specifically, no fracture,
subluxation, or dislocation. Soft tissues are intact. Bilateral
tubal ligation clips noted.
IMPRESSION: Early joint space narrowing bilaterally.  No acute bony abnormality.

## 2017-06-29 ENCOUNTER — Inpatient Hospital Stay
Admission: EM | Admit: 2017-06-29 | Discharge: 2017-07-02 | DRG: 190 | Disposition: A | Discharge status: Home / Self Care | Payer: Medicaid Other | Attending: Internal Medicine | Admitting: Internal Medicine

## 2017-06-29 ENCOUNTER — Other Ambulatory Visit: Payer: Self-pay

## 2017-06-29 ENCOUNTER — Emergency Department: Payer: Medicaid Other

## 2017-06-29 DIAGNOSIS — Z79899 Other long term (current) drug therapy: Secondary | ICD-10-CM

## 2017-06-29 DIAGNOSIS — Z7982 Long term (current) use of aspirin: Secondary | ICD-10-CM | POA: Diagnosis not present

## 2017-06-29 DIAGNOSIS — I2510 Atherosclerotic heart disease of native coronary artery without angina pectoris: Secondary | ICD-10-CM | POA: Diagnosis present

## 2017-06-29 DIAGNOSIS — J9622 Acute and chronic respiratory failure with hypercapnia: Secondary | ICD-10-CM | POA: Diagnosis present

## 2017-06-29 DIAGNOSIS — J441 Chronic obstructive pulmonary disease with (acute) exacerbation: Principal | ICD-10-CM

## 2017-06-29 DIAGNOSIS — Z885 Allergy status to narcotic agent status: Secondary | ICD-10-CM

## 2017-06-29 DIAGNOSIS — I110 Hypertensive heart disease with heart failure: Secondary | ICD-10-CM | POA: Diagnosis present

## 2017-06-29 DIAGNOSIS — Z825 Family history of asthma and other chronic lower respiratory diseases: Secondary | ICD-10-CM | POA: Diagnosis not present

## 2017-06-29 DIAGNOSIS — D72829 Elevated white blood cell count, unspecified: Secondary | ICD-10-CM | POA: Diagnosis present

## 2017-06-29 DIAGNOSIS — Z8249 Family history of ischemic heart disease and other diseases of the circulatory system: Secondary | ICD-10-CM

## 2017-06-29 DIAGNOSIS — J9621 Acute and chronic respiratory failure with hypoxia: Secondary | ICD-10-CM | POA: Diagnosis present

## 2017-06-29 DIAGNOSIS — E440 Moderate protein-calorie malnutrition: Secondary | ICD-10-CM | POA: Diagnosis present

## 2017-06-29 DIAGNOSIS — I5032 Chronic diastolic (congestive) heart failure: Secondary | ICD-10-CM | POA: Diagnosis present

## 2017-06-29 DIAGNOSIS — Z6821 Body mass index (BMI) 21.0-21.9, adult: Secondary | ICD-10-CM

## 2017-06-29 DIAGNOSIS — B1920 Unspecified viral hepatitis C without hepatic coma: Secondary | ICD-10-CM | POA: Diagnosis present

## 2017-06-29 DIAGNOSIS — F17210 Nicotine dependence, cigarettes, uncomplicated: Secondary | ICD-10-CM | POA: Diagnosis present

## 2017-06-29 DIAGNOSIS — Z7951 Long term (current) use of inhaled steroids: Secondary | ICD-10-CM

## 2017-06-29 DIAGNOSIS — F419 Anxiety disorder, unspecified: Secondary | ICD-10-CM | POA: Diagnosis present

## 2017-06-29 DIAGNOSIS — Z9981 Dependence on supplemental oxygen: Secondary | ICD-10-CM

## 2017-06-29 DIAGNOSIS — R0603 Acute respiratory distress: Secondary | ICD-10-CM

## 2017-06-29 DIAGNOSIS — E860 Dehydration: Secondary | ICD-10-CM | POA: Diagnosis present

## 2017-06-29 DIAGNOSIS — E785 Hyperlipidemia, unspecified: Secondary | ICD-10-CM | POA: Diagnosis present

## 2017-06-29 LAB — BLOOD GAS, VENOUS
ACID-BASE EXCESS: 1.2 mmol/L (ref 0.0–2.0)
BICARBONATE: 29.3 mmol/L — AB (ref 20.0–28.0)
FIO2: 0.6
O2 Saturation: 94.2 %
PCO2 VEN: 61 mmHg — AB (ref 44.0–60.0)
PH VEN: 7.29 (ref 7.250–7.430)
PO2 VEN: 80 mmHg — AB (ref 32.0–45.0)
Patient temperature: 37

## 2017-06-29 LAB — COMPREHENSIVE METABOLIC PANEL
ALBUMIN: 4.5 g/dL (ref 3.5–5.0)
ALT: 16 U/L (ref 14–54)
ANION GAP: 10 (ref 5–15)
AST: 22 U/L (ref 15–41)
Alkaline Phosphatase: 52 U/L (ref 38–126)
BUN: 20 mg/dL (ref 6–20)
CHLORIDE: 104 mmol/L (ref 101–111)
CO2: 26 mmol/L (ref 22–32)
CREATININE: 0.53 mg/dL (ref 0.44–1.00)
Calcium: 8.7 mg/dL — ABNORMAL LOW (ref 8.9–10.3)
GFR calc non Af Amer: >60 mL/min (ref >60)
GLUCOSE: 129 mg/dL — AB (ref 65–99)
Potassium: 3.9 mmol/L (ref 3.5–5.1)
SODIUM: 140 mmol/L (ref 135–145)
Total Bilirubin: 0.3 mg/dL (ref 0.3–1.2)
Total Protein: 8 g/dL (ref 6.5–8.1)

## 2017-06-29 LAB — CBC WITH DIFFERENTIAL/PLATELET
Basophils Absolute: 0.1 10*3/uL (ref 0–0.1)
Basophils Relative: 1 %
Eosinophils Absolute: 0.1 10*3/uL (ref 0–0.7)
Eosinophils Relative: 1 %
HCT: 40.5 % (ref 35.0–47.0)
HEMOGLOBIN: 12.9 g/dL (ref 12.0–16.0)
LYMPHS ABS: 3.3 10*3/uL (ref 1.0–3.6)
LYMPHS PCT: 23 %
MCH: 28.4 pg (ref 26.0–34.0)
MCHC: 32 g/dL (ref 32.0–36.0)
MCV: 88.8 fL (ref 80.0–100.0)
MONOS PCT: 8 %
Monocytes Absolute: 1.1 10*3/uL — ABNORMAL HIGH (ref 0.2–0.9)
NEUTROS PCT: 67 %
Neutro Abs: 9.6 10*3/uL — ABNORMAL HIGH (ref 1.4–6.5)
Platelets: 325 10*3/uL (ref 150–440)
RBC: 4.56 MIL/uL (ref 3.80–5.20)
RDW: 14 % (ref 11.5–14.5)
WBC: 14.2 10*3/uL — AB (ref 3.6–11.0)

## 2017-06-29 LAB — TROPONIN I: Troponin I: <0.03 ng/mL (ref <0.03)

## 2017-06-29 LAB — TSH: TSH: 2.17 u[IU]/mL (ref 0.350–4.500)

## 2017-06-29 LAB — BRAIN NATRIURETIC PEPTIDE: B Natriuretic Peptide: 38 pg/mL (ref 0.0–100.0)

## 2017-06-29 LAB — MRSA PCR SCREENING: MRSA by PCR: NEGATIVE

## 2017-06-29 MED ORDER — ALPRAZOLAM 0.5 MG PO TABS
0.5000 mg | ORAL_TABLET | Freq: Three times a day (TID) | ORAL | Status: DC
  Administered 2017-06-29 – 2017-07-02 (×9): 0.5 mg via ORAL
  Filled 2017-06-29 (×9): qty 1

## 2017-06-29 MED ORDER — ACETAMINOPHEN 650 MG RE SUPP: 650.0000 mg | Freq: Four times a day (QID) | RECTAL | Status: DC | PRN

## 2017-06-29 MED ORDER — DEXTROSE 5 % IV SOLN
1.0000 g | INTRAVENOUS | Status: DC
  Administered 2017-06-29 – 2017-06-30 (×2): 1 g via INTRAVENOUS
  Filled 2017-06-29 (×2): qty 10

## 2017-06-29 MED ORDER — SODIUM CHLORIDE 0.9 % IV BOLUS (SEPSIS)
1000.0000 mL | Freq: Once | INTRAVENOUS | Status: AC
Start: 2017-06-29 — End: 2017-06-29
  Administered 2017-06-29: 1000 mL via INTRAVENOUS

## 2017-06-29 MED ORDER — LORAZEPAM 2 MG/ML IJ SOLN
1.0000 mg | Freq: Once | INTRAMUSCULAR | Status: AC
  Administered 2017-06-29: 1 mg via INTRAVENOUS

## 2017-06-29 MED ORDER — DOCUSATE SODIUM 100 MG PO CAPS
100.0000 mg | ORAL_CAPSULE | Freq: Two times a day (BID) | ORAL | Status: DC
  Administered 2017-06-29 – 2017-07-02 (×5): 100 mg via ORAL
  Filled 2017-06-29 (×6): qty 1

## 2017-06-29 MED ORDER — LORAZEPAM 2 MG/ML IJ SOLN
INTRAMUSCULAR | Status: AC
  Filled 2017-06-29: qty 1

## 2017-06-29 MED ORDER — GUAIFENESIN-CODEINE 100-10 MG/5ML PO SOLN
10.0000 mL | ORAL | Status: DC | PRN
  Administered 2017-06-30 – 2017-07-02 (×4): 10 mL via ORAL
  Filled 2017-06-29 (×4): qty 10

## 2017-06-29 MED ORDER — ENALAPRIL MALEATE 5 MG PO TABS
5.0000 mg | ORAL_TABLET | Freq: Every day | ORAL | Status: DC
  Administered 2017-06-29 – 2017-07-02 (×4): 5 mg via ORAL
  Filled 2017-06-29 (×4): qty 1

## 2017-06-29 MED ORDER — ALBUTEROL SULFATE (2.5 MG/3ML) 0.083% IN NEBU
2.5000 mg | INHALATION_SOLUTION | Freq: Four times a day (QID) | RESPIRATORY_TRACT | Status: DC
  Administered 2017-06-29 – 2017-07-02 (×12): 2.5 mg via RESPIRATORY_TRACT
  Filled 2017-06-29 (×12): qty 3

## 2017-06-29 MED ORDER — NITROGLYCERIN IN D5W 200-5 MCG/ML-% IV SOLN
INTRAVENOUS | Status: AC
  Filled 2017-06-29: qty 250

## 2017-06-29 MED ORDER — NITROGLYCERIN 0.4 MG SL SUBL
0.4000 mg | SUBLINGUAL_TABLET | SUBLINGUAL | Status: DC | PRN
  Administered 2017-06-29: 0.4 mg via SUBLINGUAL

## 2017-06-29 MED ORDER — NITROGLYCERIN IN D5W 200-5 MCG/ML-% IV SOLN
0.0000 ug/min | INTRAVENOUS | Status: DC
  Administered 2017-06-29: 50 ug/min via INTRAVENOUS

## 2017-06-29 MED ORDER — TIOTROPIUM BROMIDE MONOHYDRATE 18 MCG IN CAPS
18.0000 ug | ORAL_CAPSULE | Freq: Every day | RESPIRATORY_TRACT | Status: DC
  Administered 2017-06-29 – 2017-07-02 (×4): 18 ug via RESPIRATORY_TRACT
  Filled 2017-06-29: qty 5

## 2017-06-29 MED ORDER — ONDANSETRON HCL 4 MG PO TABS
4.0000 mg | ORAL_TABLET | Freq: Four times a day (QID) | ORAL | Status: DC | PRN
Start: 2017-06-29 — End: 2017-07-02

## 2017-06-29 MED ORDER — METHYLPREDNISOLONE SODIUM SUCC 125 MG IJ SOLR
60.0000 mg | Freq: Four times a day (QID) | INTRAMUSCULAR | Status: DC
  Administered 2017-06-29 – 2017-06-30 (×5): 60 mg via INTRAVENOUS
  Filled 2017-06-29 (×5): qty 2

## 2017-06-29 MED ORDER — ACETAMINOPHEN 325 MG PO TABS
650.0000 mg | ORAL_TABLET | Freq: Four times a day (QID) | ORAL | Status: DC | PRN
Start: 2017-06-29 — End: 2017-07-02

## 2017-06-29 MED ORDER — NICOTINE 21 MG/24HR TD PT24
21.0000 mg | MEDICATED_PATCH | Freq: Every day | TRANSDERMAL | Status: DC
  Administered 2017-06-29 – 2017-07-02 (×4): 21 mg via TRANSDERMAL
  Filled 2017-06-29 (×5): qty 1

## 2017-06-29 MED ORDER — MORPHINE SULFATE (PF) 2 MG/ML IV SOLN
2.0000 mg | INTRAVENOUS | Status: DC | PRN
  Administered 2017-07-01: 2 mg via INTRAVENOUS
  Filled 2017-06-29: qty 1

## 2017-06-29 MED ORDER — MOMETASONE FURO-FORMOTEROL FUM 200-5 MCG/ACT IN AERO
2.0000 | INHALATION_SPRAY | Freq: Two times a day (BID) | RESPIRATORY_TRACT | Status: DC
  Administered 2017-06-29 – 2017-07-02 (×6): 2 via RESPIRATORY_TRACT
  Filled 2017-06-29: qty 8.8

## 2017-06-29 MED ORDER — NITROGLYCERIN 0.4 MG SL SUBL
SUBLINGUAL_TABLET | SUBLINGUAL | Status: AC
  Filled 2017-06-29: qty 1

## 2017-06-29 MED ORDER — ASPIRIN EC 81 MG PO TBEC
81.0000 mg | DELAYED_RELEASE_TABLET | Freq: Every day | ORAL | Status: DC
  Administered 2017-06-29 – 2017-07-02 (×4): 81 mg via ORAL
  Filled 2017-06-29 (×4): qty 1

## 2017-06-29 MED ORDER — DEXTROSE 5 % IV SOLN
500.0000 mg | INTRAVENOUS | Status: DC
  Administered 2017-06-29 – 2017-06-30 (×2): 500 mg via INTRAVENOUS
  Filled 2017-06-29 (×2): qty 500

## 2017-06-29 MED ORDER — ONDANSETRON HCL 4 MG/2ML IJ SOLN: 4.0000 mg | Freq: Four times a day (QID) | INTRAMUSCULAR | Status: DC | PRN

## 2017-06-29 MED ORDER — IPRATROPIUM-ALBUTEROL 0.5-2.5 (3) MG/3ML IN SOLN
3.0000 mL | Freq: Four times a day (QID) | RESPIRATORY_TRACT | Status: DC | PRN
  Administered 2017-06-30 – 2017-07-01 (×2): 3 mL via RESPIRATORY_TRACT
  Filled 2017-06-29: qty 3

## 2017-06-29 MED ORDER — MAGNESIUM SULFATE 2 GM/50ML IV SOLN
2.0000 g | Freq: Once | INTRAVENOUS | Status: AC
  Administered 2017-06-29: 2 g via INTRAVENOUS
  Filled 2017-06-29: qty 50

## 2017-06-29 MED ORDER — ENOXAPARIN SODIUM 40 MG/0.4ML ~~LOC~~ SOLN: 40.0000 mg | SUBCUTANEOUS | Status: DC

## 2017-06-29 MED ORDER — CARVEDILOL 3.125 MG PO TABS
3.1250 mg | ORAL_TABLET | Freq: Two times a day (BID) | ORAL | Status: DC
  Administered 2017-06-29 – 2017-07-02 (×7): 3.125 mg via ORAL
  Filled 2017-06-29 (×7): qty 1

## 2017-06-29 MED ORDER — ALBUTEROL SULFATE (2.5 MG/3ML) 0.083% IN NEBU
2.5000 mg | INHALATION_SOLUTION | RESPIRATORY_TRACT | Status: DC
  Administered 2017-06-29: 2.5 mg via RESPIRATORY_TRACT
  Filled 2017-06-29: qty 3

## 2017-06-29 MED ORDER — ENOXAPARIN SODIUM 30 MG/0.3ML ~~LOC~~ SOLN
30.0000 mg | SUBCUTANEOUS | Status: DC
  Administered 2017-06-29 – 2017-07-01 (×3): 30 mg via SUBCUTANEOUS
  Filled 2017-06-29 (×4): qty 0.3

## 2017-06-29 NOTE — ED Notes (Signed)
Pt cleansed of incontinent urine.  

## 2017-06-29 NOTE — Progress Notes (Signed)
Notified ED of patient's belongings.

## 2017-06-29 NOTE — ED Triage Notes (Signed)
Pt arrives in resp distress. Pt with orthopnea. Ems gave 2 duonebs, 2 albuterol nebs, 0.3mg  ntg SL, 125mg  solumedrol and 1 gm of MG2+.

## 2017-06-29 NOTE — Progress Notes (Signed)
The patient was admitted for acute on chronic respiratory failure.  She was put on BiPAP and off BiPAP this morning.  On oxygen 2 L per nasal counter. Vital signs are stable.  The patient has bilateral diminished lung sounds but no wheezing.  Acute on chronic respiratory failure with hypoxia due to COPD exacerbation. Continue oxygen by nasal cannula, nebulizer as needed, IV steroid and other current treatment.  Tobacco abuse.  Smoking cessation was counseled for 3-4 minutes.  I discussed with Dr. Jefferson Fuel and RN in ICU.  Time spent about 32 minutes.

## 2017-06-29 NOTE — Consult Note (Signed)
[facility] Consultation    ASSESSMENT/PLAN   COPD exacerbation. Patient has been started on albuterol, Tiotroprium,Dulera, azithromycin. Will add Solu-Medrol, Rocephin  Leukocytosis noted. Patient has been on prednisone in the past, reactive versus secondary infection.   VENTILATOR SETTINGS: FiO2 (%):  [40 %] 40 %  INTAKE / OUTPUT:  Intake/Output Summary (Last 24 hours) at 06/29/2017 0939 Last data filed at 06/29/2017 0556 Gross per 24 hour  Intake 56 ml  Output -  Net 56 ml    Name: Michelle [name] MRN: [id number] DOB: [birth year]    ADMISSION DATE:  06/29/2017 CONSULTATION DATE:  06/29/2017  REFERRING MD :  Hospitalist  CHIEF COMPLAINT:  Shortness of Breath   HISTORY OF PRESENT ILLNESS:  [name] is a very pleasant 57 year old female with a past medical history remarkable for hypertension, hyperlipidemia, hepatitis C, coronary artery disease, prior history of cocaine use, COPD/emphysema, on home oxygen therapy, pneumothorax, anxiety who presented to the emergency department with complaints of respiratory difficulty. She has had 2 days of progressive increasing cough and congestion with wheezing. Upon arrival into the emergency department she was in severe distress, she was given Solu-Medrol, multiple nebulizer therapy, magnesium, sublingual nitroglycerin started on noninvasive ventilation and transferred to the intensive care unit. This morning on exam she is doing much better and has been taken off of BiPAP presently on nasal cannula resting comfortably.  PAST MEDICAL HISTORY :  Past Medical History:  Diagnosis Date  . Allergy   . Anxiety   . Asthma   . CHF (congestive heart failure) ([place])   . Cocaine abuse   . COPD (chronic obstructive pulmonary disease) ([place])   . Coronary artery disease   . Emphysema/COPD ([place])   . Hepatitis C   . High cholesterol   . Hypertension   . Pneumothorax   . Tobacco abuse    Past Surgical History:    Procedure Laterality Date  . CARDIAC CATHETERIZATION    . CHEST TUBE INSERTION     Prior to Admission medications   Medication Sig Start Date End Date Taking? Authorizing Provider  albuterol (PROVENTIL HFA;VENTOLIN HFA) 108 (90 Base) MCG/ACT inhaler Inhale 2 puffs into the lungs every 6 (six) hours as needed for wheezing or shortness of breath. 02/26/17  Yes Dustin Flock, MD  aspirin EC 81 MG tablet Take 81 mg by mouth daily.   Yes [provider]  carvedilol (COREG) 3.125 MG tablet Take 1 tablet (3.125 mg total) by mouth every morning. Patient taking differently: Take 3.125 mg by mouth 2 (two) times daily with a meal.  02/10/16  Yes Gladstone Lighter, MD  enalapril (VASOTEC) 10 MG tablet Take 0.5 tablets (5 mg total) by mouth daily. 02/10/16  Yes Gladstone Lighter, MD  ipratropium-albuterol (DUONEB) 0.5-2.5 (3) MG/3ML SOLN Take 3 mLs by nebulization every 6 (six) hours as needed. 01/04/17  Yes [name], MD  SYMBICORT 160-4.5 MCG/ACT inhaler Take 2 puffs by mouth 2 (two) times daily.   Yes [provider]  tiotropium (SPIRIVA) 18 MCG inhalation capsule Place 1 capsule (18 mcg total) into inhaler and inhale daily. 01/04/17  Yes [name], MD  azithromycin (ZITHROMAX) 250 MG tablet One tab daily x 2 days Patient not taking: Reported on 06/29/2017 02/26/17   Dustin Flock, MD  feeding supplement, ENSURE ENLIVE, (ENSURE ENLIVE) LIQD Take 237 mLs by mouth 2 (two) times daily between meals. Patient not taking: Reported on 06/29/2017 10/27/14   Nicholes Mango, MD  guaiFENesin [facility]) 600  MG 12 hr tablet Take 1 tablet (600 mg total) by mouth 2 (two) times daily. Patient not taking: Reported on 06/29/2017 02/26/17   Dustin Flock, MD  guaiFENesin-dextromethorphan [facility] DM) 100-10 MG/5ML syrup Take 5 mLs by mouth every 4 (four) hours as needed for cough (chest congestion). Patient not taking: Reported on 06/29/2017 05/27/16   Max Sane, MD  LORazepam  (ATIVAN) 1 MG tablet Take 1 tablet (1 mg total) by mouth 2 (two) times daily as needed for anxiety. Patient not taking: Reported on 06/29/2017 01/04/17   [name], MD  mometasone-formoterol [facility] 200-5 MCG/ACT AERO Inhale 2 puffs into the lungs 2 (two) times daily. Patient not taking: Reported on 06/29/2017 01/04/17   [name], MD  nicotine (NICODERM CQ - DOSED IN MG/24 HOURS) 14 mg/24hr patch Place 1 patch (14 mg total) onto the skin daily. Patient not taking: Reported on 02/24/2017 01/04/17   [name], MD  predniSONE (STERAPRED UNI-PAK 21 TAB) 10 MG (21) TBPK tablet Start at 60mg  taper by 10mg  until complete Patient not taking: Reported on 06/29/2017 02/26/17   Dustin Flock, MD   Allergies  Allergen Reactions  . Morphine And Related     Family reports hallucinations and POA reports she doesn't want pt taking it    FAMILY HISTORY:  Family History  Problem Relation Age of Onset  . Lung cancer Mother   . Asthma Mother   . CVA Father   . CAD Father   . Stroke Father   . Breast cancer Maternal Aunt 67  . COPD Sister    SOCIAL HISTORY:  reports that she has been smoking cigarettes.  She has a 10.25 pack-year smoking history. she has never used smokeless tobacco. She reports that she does not drink alcohol or use drugs.  REVIEW OF SYSTEMS:   Constitutional: Feels well. Cardiovascular: No chest pain.  Pulmonary: Denies dyspnea.   The remainder of systems were reviewed and were found to be negative other than what is documented in the HPI.    VITAL SIGNS: Temp:  [97.3 F (36.3 C)-97.8 F (36.6 C)] 97.8 F (36.6 C) (01/26 0800) Pulse Rate:  [81-136] 85 (01/26 0900) Resp:  [15-32] 17 (01/26 0900) BP: (81-139)/(54-116) 122/77 (01/26 0900) SpO2:  [92 %-100 %] 100 % (01/26 0900) FiO2 (%):  [40 %] 40 % (01/26 0610) Weight:  [97 lb 3.6 oz (44.1 kg)-100 lb (45.4 kg)] 97 lb 3.6 oz (44.1 kg) (01/26 0610) HEMODYNAMICS:   VENTILATOR SETTINGS: FiO2  (%):  [40 %] 40 % INTAKE / OUTPUT:  Intake/Output Summary (Last 24 hours) at 06/29/2017 0939 Last data filed at 06/29/2017 0556 Gross per 24 hour  Intake 56 ml  Output -  Net 56 ml    Physical Examination:   VS: BP 122/77   Pulse 85   Temp 97.8 F (36.6 C) (Oral)   Resp 17   Ht 4\' 9"  (1.448 m)   Wt 97 lb 3.6 oz (44.1 kg)   LMP 03/06/2005 (Approximate)   SpO2 100%   BMI 21.04 kg/m   General Appearance: No distress  Neuro:without focal findings, mental status, speech normal,. HEENT: PERRLA, EOM intact, no ptosis, no other lesions noticed;  Pulmonary: normal breath sounds., diaphragmatic excursion normal. CardiovascularNormal S1,S2.  No m/r/g.    Abdomen: Benign, Soft, non-tender, No masses, hepatosplenomegaly, No lymphadenopathy Renal:  No costovertebral tenderness  GU:  Not performed at this time. Endoc: No evident thyromegaly, no signs of acromegaly. Skin:   warm, no rashes, no ecchymosis  Extremities: normal, no cyanosis, clubbing, no edema, warm with normal capillary refill.    LABS: Reviewed   LABORATORY PANEL:   CBC Recent Labs  Lab 06/29/17 0412  WBC 14.2*  HGB 12.9  HCT 40.5  PLT 325    Chemistries  Recent Labs  Lab 06/29/17 0412  NA 140  K 3.9  CL 104  CO2 26  GLUCOSE 129*  BUN 20  CREATININE 0.53  CALCIUM 8.7*  AST 22  ALT 16  ALKPHOS 52  BILITOT 0.3    No results for input(s): GLUCAP in the last 168 hours. No results for input(s): PHART, PCO2ART, PO2ART in the last 168 hours. Recent Labs  Lab 06/29/17 0412  AST 22  ALT 16  ALKPHOS 52  BILITOT 0.3  ALBUMIN 4.5    Cardiac Enzymes Recent Labs  Lab 06/29/17 0412  TROPONINI <0.03    RADIOLOGY:  Dg Chest Port 1 View  Result Date: 06/29/2017 CLINICAL DATA:  Respiratory distress. Shortness of breath. History of COPD and CHF. EXAM: PORTABLE CHEST 1 VIEW COMPARISON:  02/22/2017 FINDINGS: Hyperinflation likely indicating emphysema. Heart size and pulmonary vascularity are  normal. No airspace disease or consolidation in the lungs. No blunting of costophrenic angles. No pneumothorax. Calcification of the aorta. IMPRESSION: Emphysematous changes in the lungs. No evidence of active pulmonary disease. Aortic atherosclerosis. Electronically Signed   By: Lucienne Capers M.D.   On: 06/29/2017 04:59   [name], DO  Patricia Pesa, M.D.  Merton Border, M.D   06/29/2017, 9:39 AM

## 2017-06-29 NOTE — ED Notes (Signed)
Dr. Mable Paris states to start ntg drip at 67mcg/min.

## 2017-06-29 NOTE — ED Notes (Signed)
Pt calmer, resp status improved.

## 2017-06-29 NOTE — ED Notes (Signed)
Dr. Mable Paris notified of hypotension. Order for ns bolus received.

## 2017-06-29 NOTE — H&P (Addendum)
Michelle [name] is an 57 y.o. female.   Chief Complaint: Respiratory distress HPI: The patient with past medical history of CHF, CAD, hypertension, tobacco abuse, emphysema and hepatitis C presents emergency department in respiratory distress.  She was given Solu-Medrol in route.  She was started on nitroglycerin drip upon arrival and placed on BiPAP.  The patient's respiratory effort had been fatiguing partly due to anxiety and she was given Ativan.  Continue to be short of breath and was unable to be weaned from noninvasive ventilation which prompted the emergency department staff to call the hospitalist service for further management.  Past Medical History:  Diagnosis Date  . Allergy   . Anxiety   . Asthma   . CHF (congestive heart failure) ([place])   . Cocaine abuse   . COPD (chronic obstructive pulmonary disease) ([place])   . Coronary artery disease   . Emphysema/COPD ([place])   . Hepatitis C   . High cholesterol   . Hypertension   . Pneumothorax   . Tobacco abuse     Past Surgical History:  Procedure Laterality Date  . CARDIAC CATHETERIZATION    . CHEST TUBE INSERTION      Family History  Problem Relation Age of Onset  . Lung cancer Mother   . Asthma Mother   . CVA Father   . CAD Father   . Stroke Father   . Breast cancer Maternal Aunt 67  . COPD Sister    Social History:  reports that she has been smoking cigarettes.  She has a 10.25 pack-year smoking history. she has never used smokeless tobacco. She reports that she does not drink alcohol or use drugs.  Allergies:  Allergies  Allergen Reactions  . Morphine And Related     Family reports hallucinations and POA reports she doesn't want pt taking it    Prior to Admission medications   Medication Sig Start Date End Date Taking? Authorizing Provider  albuterol (PROVENTIL HFA;VENTOLIN HFA) 108 (90 Base) MCG/ACT inhaler Inhale 2 puffs into the lungs every 6 (six) hours as needed for wheezing or shortness of breath. 02/26/17  Yes  Dustin Flock, MD  aspirin EC 81 MG tablet Take 81 mg by mouth daily.   Yes [provider]  carvedilol (COREG) 3.125 MG tablet Take 1 tablet (3.125 mg total) by mouth every morning. Patient taking differently: Take 3.125 mg by mouth 2 (two) times daily with a meal.  02/10/16  Yes Gladstone Lighter, MD  enalapril (VASOTEC) 10 MG tablet Take 0.5 tablets (5 mg total) by mouth daily. 02/10/16  Yes Gladstone Lighter, MD  ipratropium-albuterol (DUONEB) 0.5-2.5 (3) MG/3ML SOLN Take 3 mLs by nebulization every 6 (six) hours as needed. 01/04/17  Yes [name], MD  SYMBICORT 160-4.5 MCG/ACT inhaler Take 2 puffs by mouth 2 (two) times daily.   Yes [provider]  tiotropium (SPIRIVA) 18 MCG inhalation capsule Place 1 capsule (18 mcg total) into inhaler and inhale daily. 01/04/17  Yes [name], MD  azithromycin (ZITHROMAX) 250 MG tablet One tab daily x 2 days Patient not taking: Reported on 06/29/2017 02/26/17   Dustin Flock, MD  feeding supplement, ENSURE ENLIVE, (ENSURE ENLIVE) LIQD Take 237 mLs by mouth 2 (two) times daily between meals. Patient not taking: Reported on 06/29/2017 10/27/14   Nicholes Mango, MD  guaiFENesin (MUCINEX) 600 MG 12 hr tablet Take 1 tablet (600 mg total) by mouth 2 (two) times daily. Patient not taking: Reported on 06/29/2017 02/26/17   Dustin Flock,  MD  guaiFENesin-dextromethorphan (ROBITUSSIN DM) 100-10 MG/5ML syrup Take 5 mLs by mouth every 4 (four) hours as needed for cough (chest congestion). Patient not taking: Reported on 06/29/2017 05/27/16   Max Sane, MD  LORazepam (ATIVAN) 1 MG tablet Take 1 tablet (1 mg total) by mouth 2 (two) times daily as needed for anxiety. Patient not taking: Reported on 06/29/2017 01/04/17   [name], MD  mometasone-formoterol [facility]) 200-5 MCG/ACT AERO Inhale 2 puffs into the lungs 2 (two) times daily. Patient not taking: Reported on 06/29/2017 01/04/17   [name], MD  nicotine  (NICODERM CQ - DOSED IN MG/24 HOURS) 14 mg/24hr patch Place 1 patch (14 mg total) onto the skin daily. Patient not taking: Reported on 02/24/2017 01/04/17   [name], MD  predniSONE (STERAPRED UNI-PAK 21 TAB) 10 MG (21) TBPK tablet Start at 88m taper by 122muntil complete Patient not taking: Reported on 06/29/2017 02/26/17   PaDustin FlockMD     Results for orders placed or performed during the hospital encounter of 06/29/17 (from the past 48 hour(s))  Comprehensive metabolic panel     Status: Abnormal   Collection Time: 06/29/17  4:12 AM  Result Value Ref Range   Sodium 140 135 - 145 mmol/L   Potassium 3.9 3.5 - 5.1 mmol/L   Chloride 104 101 - 111 mmol/L   CO2 26 22 - 32 mmol/L   Glucose, Bld 129 (H) 65 - 99 mg/dL   BUN 20 6 - 20 mg/dL   Creatinine, Ser 0.53 0.44 - 1.00 mg/dL   Calcium 8.7 (L) 8.9 - 10.3 mg/dL   Total Protein 8.0 6.5 - 8.1 g/dL   Albumin 4.5 3.5 - 5.0 g/dL   AST 22 15 - 41 U/L   ALT 16 14 - 54 U/L   Alkaline Phosphatase 52 38 - 126 U/L   Total Bilirubin 0.3 0.3 - 1.2 mg/dL   GFR calc non Af Amer >60 >60 mL/min   GFR calc Af Amer >60 >60 mL/min    Comment: (NOTE) The eGFR has been calculated using the CKD EPI equation. This calculation has not been validated in all clinical situations. eGFR's persistently <60 mL/min signify possible Chronic Kidney Disease.    Anion gap 10 5 - 15    Comment: [facility] [id number] I     Status: None   Collection Time: 06/29/17  4:12 AM  Result Value Ref Range   Troponin I <0.03 <0.03 ng/mL    Comment: [facility]formed at [facility] [id number] with Differential     Status: Abnormal   Collection Time: 06/29/17  4:12 AM  Result Value Ref Range   WBC 14.2 (H) 3.6 - 11.0 K/uL   RBC 4.56 3.80 - 5.20 MIL/uL   Hemoglobin 12.9 12.0 - 16.0 g/dL   HCT 40.5 35.0 - 47.0 %   MCV 88.8 80.0 - 100.0 fL   MCH 28.4  26.0 - 34.0 pg   MCHC 32.0 32.0 - 36.0 g/dL   RDW 14.0 11.5 - 14.5 %   Platelets 325 150 - 440 K/uL   Neutrophils Relative % 67 %   Neutro Abs 9.6 (H) 1.4 - 6.5 K/uL   Lymphocytes Relative 23 %   Lymphs Abs 3.3 1.0 - 3.6 K/uL   Monocytes Relative 8 %   Monocytes Absolute 1.1 (H) 0.2 - 0.9 K/uL   Eosinophils Relative 1 %  Eosinophils Absolute 0.1 0 - 0.7 K/uL   Basophils Relative 1 %   Basophils Absolute 0.1 0 - 0.1 K/uL    Comment: Performed at [facility], [address] [place], [place] [address]  Brain natriuretic peptide     Status: None   Collection Time: 06/29/17  4:12 AM  Result Value Ref Range   B Natriuretic Peptide 38.0 0.0 - 100.0 pg/mL    Comment: Performed at [facility], [address] [place],  [address]  Blood gas, venous     Status: Abnormal   Collection Time: 06/29/17  4:25 AM  Result Value Ref Range   FIO2 0.60    pH, Ven 7.29 7.250 - 7.430   pCO2, Ven 61 (H) 44.0 - 60.0 mmHg   pO2, Ven 80.0 (H) 32.0 - 45.0 mmHg   Bicarbonate 29.3 (H) 20.0 - 28.0 mmol/L   Acid-Base Excess 1.2 0.0 - 2.0 mmol/L   O2 Saturation 94.2 %   Patient temperature 37.0    Collection site VENOUS    Sample type VENOUS     Comment: Performed at [facility], [address] [place],  [address]   Dg Chest Port 1 View  Result Date: 06/29/2017 CLINICAL DATA:  Respiratory distress. Shortness of breath. History of COPD and CHF. EXAM: PORTABLE CHEST 1 VIEW COMPARISON:  02/22/2017 FINDINGS: Hyperinflation likely indicating emphysema. Heart size and pulmonary vascularity are normal. No airspace disease or consolidation in the lungs. No blunting of costophrenic angles. No pneumothorax. Calcification of the aorta. IMPRESSION: Emphysematous changes in the lungs. No evidence of active pulmonary disease. Aortic atherosclerosis. Electronically Signed   By: Lucienne Capers M.D.   On: 06/29/2017 04:59    Review of Systems  Unable to perform ROS: Severe  respiratory distress    Blood pressure (!) 81/66, pulse 82, temperature (!) 97.3 F (36.3 C), temperature source Axillary, resp. rate (!) 30, weight 45.4 kg (100 lb), last menstrual period 03/06/2005, SpO2 97 %. Physical Exam  Vitals reviewed. Constitutional: She is oriented to person, place, and time. She appears well-developed and well-nourished. No distress.  HENT:  Head: Normocephalic and atraumatic.  Mouth/Throat: Oropharynx is clear and moist.  Eyes: Conjunctivae and EOM are normal. Pupils are equal, round, and reactive to light.  Neck: Normal range of motion. Neck supple. No JVD present. No tracheal deviation present. No thyromegaly present.  Cardiovascular: Normal rate, regular rhythm and normal heart sounds. Exam reveals no gallop and no friction rub.  No murmur heard. Respiratory: Breath sounds normal. She is in respiratory distress.  On BiPAP  GI: Soft. Bowel sounds are normal. She exhibits no distension. There is no tenderness.  Genitourinary:  Genitourinary Comments: Deferred  Musculoskeletal: Normal range of motion. She exhibits no edema.  Lymphadenopathy:    She has no cervical adenopathy.  Neurological: She is alert and oriented to person, place, and time. No cranial nerve deficit. She exhibits normal muscle tone.  Skin: Skin is warm and dry. No rash noted. No erythema.  Psychiatric:  Cannot assess mental status the patient is somnolent and in respiratory distress cannot participate with exam     Assessment/Plan This is a 57 year old female admitted for respiratory failure. 1.  Respiratory failure: Acute on chronic; with hypoxia and hypercapnia.  Patient has received Solu-Medrol in route as well as multiple breathing treatments in addition to IV magnesium.  She is somnolent now secondary to respiratory failure as well as Ativan given for her work of breathing.  She  will arouse with strong verbal command.  She is still appropriate for BiPAP at this time.  She will be  difficult to wean from mechanical ventilation if intubated due to severe emphysema. 2.  COPD: Emphysematous.  Exacerbation leading to respiratory failure.  Continue steroids.  Azithromycin for anti-inflammatory effect.  Albuterol scheduled.  Duo nebs as needed.  Continue inhaled corticosteroid as well as Spiriva. 3.  CAD: Stable; continue aspirin 4.  CHF: Diastolic; chronic.  Continue Coreg and enalapril 5.  DVT prophylaxis: Lovenox 6.  GI prophylaxis: None The patient is a full code.  Time spent on admission orders and critical care approximately 45 minutes.  Discussed with [facility] telemedicine  Harrie Foreman, MD 06/29/2017, 5:51 AM

## 2017-06-29 NOTE — Progress Notes (Signed)
Lovenox changed to 30 mg daily for TBW <45kg. 

## 2017-06-29 NOTE — ED Provider Notes (Signed)
[facility] Emergency Department Provider Note  ____________________________________________   First MD Initiated Contact with Patient 06/29/17 0410     (approximate)  I have reviewed the triage vital signs and the nursing notes.   HISTORY  Chief Complaint Respiratory Distress  Level 5 exemption history limited by the patient's clinical condition  HPI Michelle [name] is a 57 y.o. female who comes to the emergency department with 2-3 days of insidious onset gradually progressive and now severe shortness of breath.  According to EMS she has a past medical history of COPD.  EMS gave her 2 duo nebs, 1 sublingual nitroglycerin, 125 mg of Solu-Medrol, and 1 g of magnesium with some improvement in her symptoms.  There is a questionable history of CHF.  Past Medical History:  Diagnosis Date  . Allergy   . Anxiety   . Asthma   . CHF (congestive heart failure) ([place])   . Cocaine abuse   . COPD (chronic obstructive pulmonary disease) ([place])   . Coronary artery disease   . Emphysema/COPD ([place])   . Hepatitis C   . High cholesterol   . Hypertension   . Pneumothorax   . Tobacco abuse     Patient Active Problem List   Diagnosis Date Noted  . Acute on chronic respiratory failure with hypoxia and hypercapnia ([place]) 06/29/2017  . Acute on chronic respiratory failure with hypoxia ([place]) 02/23/2017  . Protein-calorie malnutrition, severe 05/23/2016  . Sepsis ([place]) 05/22/2016  . Malnutrition of moderate degree ([place]) 10/25/2014  . COPD exacerbation ([place]) 10/24/2014  . Tobacco abuse 10/24/2014    Past Surgical History:  Procedure Laterality Date  . CARDIAC CATHETERIZATION    . CHEST TUBE INSERTION      Prior to Admission medications   Medication Sig Start Date End Date Taking? Authorizing Provider  albuterol (PROVENTIL HFA;VENTOLIN HFA) 108 (90 Base) MCG/ACT inhaler Inhale 2 puffs into the lungs every 6 (six) hours as needed for wheezing or shortness of breath.  02/26/17  Yes Dustin Flock, MD  aspirin EC 81 MG tablet Take 81 mg by mouth daily.   Yes [provider]  carvedilol (COREG) 3.125 MG tablet Take 1 tablet (3.125 mg total) by mouth every morning. Patient taking differently: Take 3.125 mg by mouth 2 (two) times daily with a meal.  02/10/16  Yes Gladstone Lighter, MD  enalapril (VASOTEC) 10 MG tablet Take 0.5 tablets (5 mg total) by mouth daily. 02/10/16  Yes Gladstone Lighter, MD  ipratropium-albuterol (DUONEB) 0.5-2.5 (3) MG/3ML SOLN Take 3 mLs by nebulization every 6 (six) hours as needed. 01/04/17  Yes [name], MD  SYMBICORT 160-4.5 MCG/ACT inhaler Take 2 puffs by mouth 2 (two) times daily.   Yes [provider]  tiotropium (SPIRIVA) 18 MCG inhalation capsule Place 1 capsule (18 mcg total) into inhaler and inhale daily. 01/04/17  Yes [name], MD  azithromycin (ZITHROMAX) 250 MG tablet One tab daily x 2 days Patient not taking: Reported on 06/29/2017 02/26/17   Dustin Flock, MD  feeding supplement, ENSURE ENLIVE, (ENSURE ENLIVE) LIQD Take 237 mLs by mouth 2 (two) times daily between meals. Patient not taking: Reported on 06/29/2017 10/27/14   Nicholes Mango, MD  guaiFENesin (MUCINEX) 600 MG 12 hr tablet Take 1 tablet (600 mg total) by mouth 2 (two) times daily. Patient not taking: Reported on 06/29/2017 02/26/17   Dustin Flock, MD  guaiFENesin-dextromethorphan [facility] DM) 100-10 MG/5ML syrup Take 5 mLs by mouth every 4 (four) hours as needed for  cough (chest congestion). Patient not taking: Reported on 06/29/2017 05/27/16   Max Sane, MD  LORazepam (ATIVAN) 1 MG tablet Take 1 tablet (1 mg total) by mouth 2 (two) times daily as needed for anxiety. Patient not taking: Reported on 06/29/2017 01/04/17   [name], MD  mometasone-formoterol [facility]) 200-5 MCG/ACT AERO Inhale 2 puffs into the lungs 2 (two) times daily. Patient not taking: Reported on 06/29/2017 01/04/17   [name], MD    nicotine (NICODERM CQ - DOSED IN MG/24 HOURS) 14 mg/24hr patch Place 1 patch (14 mg total) onto the skin daily. Patient not taking: Reported on 02/24/2017 01/04/17   [name], MD  predniSONE (STERAPRED UNI-PAK 21 TAB) 10 MG (21) TBPK tablet Start at 60mg  taper by 10mg  until complete Patient not taking: Reported on 06/29/2017 02/26/17   Dustin Flock, MD    Allergies Morphine and related  Family History  Problem Relation Age of Onset  . Lung cancer Mother   . Asthma Mother   . CVA Father   . CAD Father   . Stroke Father   . Breast cancer Maternal Aunt 67  . COPD Sister     Social History Social History   Tobacco Use  . Smoking status: Current Every Day Smoker    Packs/day: 0.25    Years: 41.00    Pack years: 10.25    Types: Cigarettes  . Smokeless tobacco: Never Used  Substance Use Topics  . Alcohol use: No  . Drug use: No    Review of Systems Level 5 exemption history limited by the patient's clinical condition  ____________________________________________   PHYSICAL EXAM:  VITAL SIGNS: ED Triage Vitals  Enc Vitals Group     BP --      Pulse Rate 06/29/17 0412 (!) 125     Resp --      Temp 06/29/17 0412 (!) 97.3 F (36.3 C)     Temp Source 06/29/17 0412 Axillary     SpO2 06/29/17 0412 92 %     Weight 06/29/17 0413 100 lb (45.4 kg)     Height --      Head Circumference --      Peak Flow --      Pain Score --      Pain Loc --      Pain Edu? --      Excl. in [place]? --     Constitutional: Sitting up tripoding in moderate to severe respiratory distress mild diaphoresis speaking in 2-3 word gasps Eyes: PERRL EOMI. Head: Atraumatic. Nose: No congestion/rhinnorhea. Mouth/Throat: No trismus Neck: No stridor.   Cardiovascular: Tachycardic rate, regular rhythm. Grossly normal heart sounds.  Good peripheral circulation. Respiratory: Increased respiratory effort using accessory muscles and tripoding Gastrointestinal: Soft nontender Musculoskeletal:  No lower extremity edema legs equal in size Neurologic: Moves all 4 Skin: Some diaphoresis Psychiatric: Extremely anxious appearing.    ____________________________________________   DIFFERENTIAL includes but not limited to  COPD exacerbation, pneumothorax, pneumonia, CHF exacerbation, flash pulmonary edema ____________________________________________   LABS (all labs ordered are listed, but only abnormal results are displayed)  Labs Reviewed  COMPREHENSIVE METABOLIC PANEL - Abnormal; Notable for the following components:      Result Value   Glucose, Bld 129 (*)    Calcium 8.7 (*)    All other components within normal limits  CBC WITH DIFFERENTIAL/PLATELET - Abnormal; Notable for the following components:   WBC 14.2 (*)    Neutro Abs 9.6 (*)    Monocytes Absolute  1.1 (*)    All other components within normal limits  BLOOD GAS, VENOUS - Abnormal; Notable for the following components:   pCO2, Ven 61 (*)    pO2, Ven 80.0 (*)    Bicarbonate 29.3 (*)    All other components within normal limits  TROPONIN I  BRAIN NATRIURETIC PEPTIDE    Lab work reviewed by me with elevated CO2 consistent with COPD.  Elevated white count nonspecific and likely secondary to stress __________________________________________  EKG  ED ECG REPORT I, Darel Hong, the attending physician, personally viewed and interpreted this ECG.  Date: 06/29/2017 EKG Time:  Rate: 132 Rhythm: Sinus tachycardia QRS Axis: Rightward axis Intervals: normal ST/T Wave abnormalities: normal Narrative Interpretation: no evidence of acute ischemia  ____________________________________________  RADIOLOGY  Chest x-ray reviewed by me with chronic COPD changes but no acute disease noted ____________________________________________   PROCEDURES  Procedure(s) performed: no  .Critical Care Performed by: Darel Hong, MD Authorized by: Darel Hong, MD   Critical care provider statement:     Critical care time (minutes):  35   Critical care time was exclusive of:  Separately billable procedures and treating other patients   Critical care was necessary to treat or prevent imminent or life-threatening deterioration of the following conditions:  Respiratory failure   Critical care was time spent personally by me on the following activities:  Development of treatment plan with patient or surrogate, discussions with consultants, evaluation of patient's response to treatment, examination of patient, obtaining history from patient or surrogate, ordering and performing treatments and interventions, ordering and review of laboratory studies, ordering and review of radiographic studies, pulse oximetry, re-evaluation of patient's condition and review of old charts    Critical Care performed: yes  Observation: no ____________________________________________   INITIAL IMPRESSION / ASSESSMENT AND PLAN / ED COURSE  Pertinent labs & imaging results that were available during my care of the patient were reviewed by me and considered in my medical decision making (see chart for details).  The patient arrives in extremis tripoding tachycardic tachypneic and hypoxic without supplemental oxygen.  She reports a possible history of CHF but on chart review she had a normal echocardiogram in 2017.  Her history is most consistent with COPD exacerbation.  She has required BiPAP multiple times in the past and was initiated on BiPAP here today with improvement in her symptoms.  Initially she was quite hypertensive so I had placed her on a nitroglycerin drip, however with 1 mg of Ativan for his taken off the drip.  At this point given her oxygen requirements she does require inpatient admission for continued breathing treatments, steroids, and oxygen support.  I discussed with the patient who verbalized understanding agreement the plan.  I then discussed with the hospitalist Dr. Marcille Blanco who is graciously agreed to  admit the patient to his service.      ____________________________________________   FINAL CLINICAL IMPRESSION(S) / ED DIAGNOSES  Final diagnoses:  Acute respiratory distress      NEW MEDICATIONS STARTED DURING THIS VISIT:  New Prescriptions   No medications on file     Note:  This document was prepared using Dragon voice recognition software and may include unintentional dictation errors.     Darel Hong, MD 06/29/17 [phone]

## 2017-06-29 NOTE — Progress Notes (Signed)
Pt placed on Bipap

## 2017-06-30 LAB — CBC
HCT: 35.9 % (ref 35.0–47.0)
Hemoglobin: 11.5 g/dL — ABNORMAL LOW (ref 12.0–16.0)
MCH: 28.4 pg (ref 26.0–34.0)
MCHC: 32.1 g/dL (ref 32.0–36.0)
MCV: 88.7 fL (ref 80.0–100.0)
Platelets: 231 10*3/uL (ref 150–440)
RBC: 4.05 MIL/uL (ref 3.80–5.20)
RDW: 13.7 % (ref 11.5–14.5)
WBC: 14.5 10*3/uL — ABNORMAL HIGH (ref 3.6–11.0)

## 2017-06-30 LAB — BASIC METABOLIC PANEL
Anion gap: 8 (ref 5–15)
BUN: 25 mg/dL — AB (ref 6–20)
CALCIUM: 9.1 mg/dL (ref 8.9–10.3)
CO2: 26 mmol/L (ref 22–32)
CREATININE: 0.83 mg/dL (ref 0.44–1.00)
Chloride: 106 mmol/L (ref 101–111)
GFR calc non Af Amer: >60 mL/min (ref >60)
GLUCOSE: 166 mg/dL — AB (ref 65–99)
Potassium: 3.8 mmol/L (ref 3.5–5.1)
Sodium: 140 mmol/L (ref 135–145)

## 2017-06-30 LAB — MAGNESIUM: Magnesium: 2.3 mg/dL (ref 1.7–2.4)

## 2017-06-30 LAB — PHOSPHORUS: Phosphorus: 3.2 mg/dL (ref 2.5–4.6)

## 2017-06-30 MED ORDER — METHYLPREDNISOLONE SODIUM SUCC 40 MG IJ SOLR
40.0000 mg | Freq: Four times a day (QID) | INTRAMUSCULAR | Status: DC
  Administered 2017-06-30 – 2017-07-01 (×4): 40 mg via INTRAVENOUS
  Filled 2017-06-30 (×4): qty 1

## 2017-06-30 MED ORDER — ENSURE ENLIVE PO LIQD
237.0000 mL | Freq: Two times a day (BID) | ORAL | Status: DC
  Administered 2017-07-01 – 2017-07-02 (×3): 237 mL via ORAL

## 2017-06-30 MED ORDER — ALUM & MAG HYDROXIDE-SIMETH 200-200-20 MG/5ML PO SUSP
30.0000 mL | ORAL | Status: DC | PRN
  Administered 2017-06-30: 30 mL via ORAL
  Filled 2017-06-30: qty 30

## 2017-06-30 NOTE — Progress Notes (Signed)
Bipap removed and pt placed back on O2 2.5L [place] as per what she uses at home.

## 2017-06-30 NOTE — Evaluation (Signed)
Physical Therapy Evaluation Patient Details Name: Michelle [name] MRN: [id number] DOB: 02/28/1961 Today's Date: 06/30/2017   History of Present Illness  presented to ER secondary to respiratory distress; admitted with acute on chronic respiratory failure with hypoxia, hypercapnia secondary to COPD exacerbation.  Initially requiring BiPAP, now weaned to 2.5L (baseline) via [place].  Clinical Impression  Upon evaluation, patient alert and oriented; follows all commands and demonstrates fair insight into deficits/need for assist.  Bilat UE/LE strength and ROM grossly symmetrical and WFL for basic transfers and mobility.  Demonstrates ability to complete bed mobility with mod indep; sit/stand, basic transfers and gait (40') without assist device, cga/close sup.  Slightly guarded, but no overt buckling or LOB.  Mod SOB with exertion, but patient with appropriate awareness of limits/need for activity cessation; maintains sats >93-94% on 3L supplemental O2 with exertion. .Would benefit from skilled PT to address above deficits and promote optimal return to PLOF; Recommend transition to [place] upon discharge from acute hospitalization, and transition to outpatient pulmonary rehab as appropriate.     Follow Up Recommendations Home health PT(with transition to pulmonary rehab as appropriate)    Equipment Recommendations       Recommendations for Other Services OT consult     Precautions / Restrictions Precautions Precautions: Fall Restrictions Weight Bearing Restrictions: No      Mobility  Bed Mobility Overal bed mobility: Modified Independent                Transfers Overall transfer level: Needs assistance   Transfers: Sit to/from Stand Sit to Stand: Supervision            Ambulation/Gait Ambulation/Gait assistance: Min guard Ambulation Distance (Feet): 40 Feet Assistive device: None       General Gait Details: decreased cadence, slightly guarded posturing, but no buckling,  LOB or significant safety concern. Mod SOB with exertion (though patient with appropriate awareness of limitations), sats >93-94% on 3L with exertion  Stairs            Wheelchair Mobility    Modified Rankin (Stroke Patients Only)       Balance Overall balance assessment: Needs assistance Sitting-balance support: No upper extremity supported;Feet supported Sitting balance-Leahy Scale: Good     Standing balance support: No upper extremity supported Standing balance-Leahy Scale: Fair                               Pertinent Vitals/Pain Pain Assessment: No/denies pain    Home Living Family/patient expects to be discharged to:: Private residence Living Arrangements: Children Available Help at Discharge: Family;Available 24 hours/day Type of Home: House Home Access: Stairs to enter Entrance Stairs-Rails: Right;Left;Can reach both Entrance Stairs-Number of Steps: 4 Home Layout: One level        Prior Function Level of Independence: Independent         Comments: Indep with transfers and household mobility; limited mobility in community (due to respiratory status).  Daughters assist with ADLs as needed and maintain responsibility for household chores     Hand Dominance        Extremity/Trunk Assessment   Upper Extremity Assessment Upper Extremity Assessment: Overall WFL for tasks assessed    Lower Extremity Assessment Lower Extremity Assessment: Overall WFL for tasks assessed(grossly at least 4+/5 throughout)       Communication   Communication: No difficulties  Cognition Arousal/Alertness: Awake/alert Behavior During Therapy: WFL for tasks assessed/performed Overall Cognitive Status: Within Functional  Limits for tasks assessed                                        General Comments      Exercises Other Exercises Other Exercises: Additional activity deferred, as patient actively transferring out of CCU to 1A    Assessment/Plan    PT Assessment Patient needs continued PT services  PT Problem List         PT Treatment Interventions DME instruction;Gait training;Stair training;Functional mobility training;Therapeutic activities;Therapeutic exercise;Balance training;Patient/family education    PT Goals (Current goals can be found in the Care Plan section)  Acute Rehab PT Goals Patient Stated Goal: to return home PT Goal Formulation: With patient Time For Goal Achievement: 07/14/17 Potential to Achieve Goals: Good    Frequency Min 2X/week   Barriers to discharge        Co-evaluation               AM-PAC PT "6 Clicks" Daily Activity  Outcome Measure Difficulty turning over in bed (including adjusting bedclothes, sheets and blankets)?: None Difficulty moving from lying on back to sitting on the side of the bed? : None Difficulty sitting down on and standing up from a chair with arms (e.g., wheelchair, bedside commode, etc,.)?: A Little Help needed moving to and from a bed to chair (including a wheelchair)?: A Little Help needed walking in hospital room?: A Little Help needed climbing 3-5 steps with a railing? : A Little 6 Click Score: 20    End of Session Equipment Utilized During Treatment: Gait belt;Oxygen Activity Tolerance: Patient tolerated treatment well Patient left: in bed;with call bell/phone within reach(with RN for transfer out of CCU) Nurse Communication: Mobility status PT Visit Diagnosis: Difficulty in walking, not elsewhere classified (R26.2)    Time: 0932-6712 PT Time Calculation (min) (ACUTE ONLY): 11 min   Charges:   PT Evaluation $PT Eval Moderate Complexity: 1 Mod     PT G Codes:        Laurabeth Yip H. Owens Shark, PT, DPT, NCS 06/30/17, 2:53 PM [phone]

## 2017-06-30 NOTE — Progress Notes (Signed)
Initial Nutrition Assessment  DOCUMENTATION CODES:   Non-severe (moderate) malnutrition in context of chronic illness  INTERVENTION:  Will liberalize diet to regular.  Provide Ensure Enlive po BID, each supplement provides 350 kcal and 20 grams of protein. Patient prefers chocolate and strawberry.  Encouraged ongoing intake of adequate calories from meals, snacks, beverages, and ONS. Discussed that with her pain after meals it may be beneficial to try smaller meals more frequently throughout the day.  NUTRITION DIAGNOSIS:   Moderate Malnutrition related to chronic illness(COPD, CHF) as evidenced by mild fat depletion, moderate fat depletion, moderate muscle depletion.  Last year patient was underweight and met criteria for severe malnutrition. She is now in the normal weight range and malnutrition has improved to moderate.  GOAL:   Patient will meet greater than or equal to 90% of their needs  MONITOR:   PO intake, Supplement acceptance, Labs, Weight trends, I & O's  REASON FOR ASSESSMENT:   Malnutrition Screening Tool, Consult Assessment of nutrition requirement/status, Poor PO  ASSESSMENT:   57 year old female with PMHx of end-stage COPD on 2.5 L oxygen at home, emphysema, CAD, hx cocaine abuse, CHF, anxiety, hepatitis C now admitted with acute exacerbation of COPD.   -Per chart patient is working to be a candidate for lung transplant (not smoking, not using opioids for air hunger). -Patient had recent admission 11/4-11/12 for acute exacerbation of COPD requiring intubation.  Met with patient at bedside. She is known to this RD from previous admission in 05/2016. Patient reports she has been working really hard to meet her calorie and protein needs and gain her weight back. She lives at home with her daughter. Per chart she has been receiving PT/OT at home after her recent admission. She drinks Ensure original 2 times daily. She eats 2 meals per day. She skips breakfast.  Usually will have a microwave meal for lunch (noodles with steak and broccoli). Also will have a microwave meal for dinner (roast beef with potatoes and vegetables). She eats snacks such as potato chips. Will also drink Pepsi during the day. She reports she has been having post-prandial abdominal pain since her recent admission in November. It sounds like it may be gas based off what she is reporting.   She has been gaining weight over the past year. She was 80 lbs in 05/2016 and has gained about 17 lbs. Her previous weight many years ago before losing had been 147 lbs.  Meal Completion: 100%  Medications reviewed and include: Colace, Solu-Medrol 60 mg Q6hrs IV, azithromycin, ceftriaxone.  Labs reviewed: BUN 25.  Discussed with RN and with MD. Faythe Ghee to liberalize diet to regular.  NUTRITION - FOCUSED PHYSICAL EXAM:    Most Recent Value  Orbital Region  Mild depletion  Upper Arm Region  Moderate depletion  Thoracic and Lumbar Region  Moderate depletion  Buccal Region  Mild depletion  Temple Region  Moderate depletion  Clavicle Bone Region  Moderate depletion  Clavicle and Acromion Bone Region  Moderate depletion  Scapular Bone Region  Moderate depletion  Dorsal Hand  Moderate depletion  Patellar Region  Severe depletion [pt has limited mobility,  working with PT]  Anterior Thigh Region  Severe depletion [pt has limited mobility,  working with PT]  Posterior Calf Region  Severe depletion [pt has limited mobility,  working with PT]  Edema (RD Assessment)  None  Hair  Reviewed  Eyes  Reviewed  Mouth  Reviewed  Skin  Reviewed  Nails  Reviewed  Diet Order:  Diet Heart Room service appropriate? Yes; Fluid consistency: Thin  EDUCATION NEEDS:   Education needs have been addressed  Skin:  Skin Assessment: Reviewed RN Assessment  Last BM:  06/29/2017 - medium type 5  Height:   Ht Readings from Last 1 Encounters:  06/29/17 4' 9"  (1.448 m)    Weight:   Wt Readings from Last 1  Encounters:  06/29/17 97 lb 3.6 oz (44.1 kg)    Ideal Body Weight:  43.2 kg  BMI:  Body mass index is 21.04 kg/m.  Estimated Nutritional Needs:   Kcal:  1325-1545 (30-35 kcal/kg)  Protein:  65-75 grams (1.5-1.7 grams/kg)  Fluid:  1.3-1.5 L/day (30-35 mL/kg)  Willey Blade, MS, RD, LDN Office: [phone] Pager: [phone] After Hours/Weekend Pager: [phone]

## 2017-06-30 NOTE — Progress Notes (Signed)
   06/30/17 1100  Clinical Encounter Type  Visited With Patient  Visit Type Initial  Referral From Nurse  Spiritual Encounters  Spiritual Needs Prayer;Emotional  Stress Factors  Patient Stress Factors Other (Comment) (troubled dreams; concern for children)   Chaplain responded to prayer request.  Patient spoke of troubled dreams and importance of prayer.  Chaplain spoke of the flexible nature of prayer and offered a prayer at patient's direction.

## 2017-06-30 NOTE — Progress Notes (Signed)
Patient ID: ORA BOLLIG, female   DOB: 1960-09-05, 57 y.o.   MRN: [id number]  [facility] Critical Care Medicine Progess Note    SYNOPSIS   [name] is a very pleasant 57 year old female with a past medical history remarkable for hypertension, hyperlipidemia, hepatitis C, coronary artery disease, prior history of cocaine use, COPD/emphysema, on home oxygen therapy, pneumothorax, anxiety who presented to the emergency department with complaints of respiratory difficulty.   ASSESSMENT/PLAN   COPD exacerbation. Patient has been started on albuterol, Tiotroprium, Dulera, azithromycin and Rocephin, Solumedrol.   Leukocytosis noted. Patient has been on prednisone in the past, reactive versus secondary infection.   VENTILATOR SETTINGS: FiO2 (%):  [28 %-40 %] 40 %  CINTAKE / OUTPUT:  Intake/Output Summary (Last 24 hours) at 06/30/2017 1023 Last data filed at 06/29/2017 2000 Gross per 24 hour  Intake 290 ml  Output 300 ml  Net -10 ml    Name: Michelle [name] MRN: [id number] DOB: 1961/01/13    ADMISSION DATE:  06/29/2017  SUBJECTIVE:   Patient doing much better today. Anxiety has improved with scheduled Xanax. Decreasing shortness of breath, weaned off of noninvasive. Uses nocturnal CPAP   VITAL SIGNS: Temp:  [97.6 F (36.4 C)-98 F (36.7 C)] 97.6 F (36.4 C) (01/27 0800) Pulse Rate:  [65-99] 99 (01/27 0800) Resp:  [13-23] 23 (01/27 0800) BP: (103-162)/(64-122) 131/82 (01/27 0800) SpO2:  [94 %-100 %] 96 % (01/27 0806) FiO2 (%):  [28 %-40 %] 40 % (01/27 0352)  PHYSICAL EXAMINATION: Physical Examination:   VS: BP 131/82 (BP Location: Left Arm)   Pulse 99   Temp 97.6 F (36.4 C) (Oral)   Resp (!) 23   Ht 4\' 9"  (1.448 m)   Wt 97 lb 3.6 oz (44.1 kg)   LMP 03/06/2005 (Approximate)   SpO2 96%   BMI 21.04 kg/m   General Appearance: No distress  Neuro:without focal findings, mental status normal. HEENT: PERRLA, EOM intact. Pulmonary: Improved aeration, decreased  proximal spasm noted  CardiovascularNormal S1,S2.  No m/r/g.   Abdomen: Benign, Soft, non-tender. Skin:   warm, no rashes, no ecchymosis  Extremities: normal, no cyanosis, clubbing.  LABORATORY PANEL:   CBC Recent Labs  Lab 06/30/17 0422  WBC 14.5*  HGB 11.5*  HCT 35.9  PLT 231    Chemistries  Recent Labs  Lab 06/29/17 0412 06/30/17 0422  NA 140 140  K 3.9 3.8  CL 104 106  CO2 26 26  GLUCOSE 129* 166*  BUN 20 25*  CREATININE 0.53 0.83  CALCIUM 8.7* 9.1  MG  --  2.3  PHOS  --  3.2  AST 22  --   ALT 16  --   ALKPHOS 52  --   BILITOT 0.3  --     No results for input(s): GLUCAP in the last 168 hours. No results for input(s): PHART, PCO2ART, PO2ART in the last 168 hours. Recent Labs  Lab 06/29/17 0412  AST 22  ALT 16  ALKPHOS 52  BILITOT 0.3  ALBUMIN 4.5    Cardiac Enzymes Recent Labs  Lab 06/29/17 0412  TROPONINI <0.03    RADIOLOGY:  Dg Chest Port 1 View  Result Date: 06/29/2017 CLINICAL DATA:  Respiratory distress. Shortness of breath. History of COPD and CHF. EXAM: PORTABLE CHEST 1 VIEW COMPARISON:  02/22/2017 FINDINGS: Hyperinflation likely indicating emphysema. Heart size and pulmonary vascularity are normal. No airspace disease or consolidation in the lungs. No blunting of costophrenic angles. No pneumothorax. Calcification of the  aorta. IMPRESSION: Emphysematous changes in the lungs. No evidence of active pulmonary disease. Aortic atherosclerosis. Electronically Signed   By: Lucienne Capers M.D.   On: 06/29/2017 04:59    Michelle Dellen, DO 06/30/2017

## 2017-06-30 NOTE — Plan of Care (Signed)
Pt initially had problems at the beginning of the shift when she was assisted up to the [facility] for a bowel movement.  Upon assistance back to bed, she was extremely dyspnic and SOB, requiring her to sit in the tripod position on the side of the bed and receive an emergent med neb tx.  On assessment, very little air movement was heard upon auscultation.  After rest and the med neb, pt's breathing returned to her baseline.  During this episode, her SaO2 was maintained>92%.  Pt educated that her activity will be limited until she has increased activity tolerance.  NP updated on episode and PT and OT orders obtained to assist pt w/ improving activity tolerance.    Dietician consult obtained for pt's poor po intake and self-reported wt loss.  Pt has been on BiPap throughout the night and tolerated it well.

## 2017-06-30 NOTE — Progress Notes (Addendum)
[place] [facility] [place] NAME: Michelle [name]    MR#:  [id number]  DATE OF BIRTH:  1961-05-12  SUBJECTIVE:  CHIEF COMPLAINT:   Chief Complaint  Patient presents with  . Respiratory Distress   Better shortness of breath, on oxygen 2 L by nasal cannula. REVIEW OF SYSTEMS:  Review of Systems  Constitutional: Positive for malaise/fatigue. Negative for chills and fever.  HENT: Negative for sore throat.   Eyes: Negative for blurred vision and double vision.  Respiratory: Positive for cough and shortness of breath. Negative for hemoptysis, sputum production, wheezing and stridor.   Cardiovascular: Negative for chest pain, palpitations, orthopnea and leg swelling.  Gastrointestinal: Negative for abdominal pain, blood in stool, diarrhea, melena, nausea and vomiting.  Genitourinary: Negative for dysuria, flank pain and hematuria.  Musculoskeletal: Negative for back pain and joint pain.  Skin: Negative for rash.  Neurological: Positive for weakness. Negative for dizziness, sensory change, focal weakness, seizures, loss of consciousness and headaches.  Endo/Heme/Allergies: Negative for polydipsia.  Psychiatric/Behavioral: Negative for depression. The patient is not nervous/anxious.     DRUG ALLERGIES:  No Active Allergies VITALS:  Blood pressure (!) 158/108, pulse 95, temperature 97.6 F (36.4 C), temperature source Oral, resp. rate 19, height 4\' 9"  (1.448 m), weight 97 lb 3.6 oz (44.1 kg), last menstrual period 03/06/2005, SpO2 94 %. PHYSICAL EXAMINATION:  Physical Exam  Constitutional: She is oriented to person, place, and time.  Severe malnutrition.  HENT:  Head: Normocephalic.  Mouth/Throat: Oropharynx is clear and moist.  Eyes: Conjunctivae and EOM are normal. Pupils are equal, round, and reactive to light. No scleral icterus.  Neck: Normal range of motion. Neck supple. No JVD present. No tracheal deviation present.  Cardiovascular: Normal  rate, regular rhythm and normal heart sounds. Exam reveals no gallop.  No murmur heard. Pulmonary/Chest: Effort normal. No respiratory distress. She has no wheezes. She has no rales.  Very diminished lung sounds.  Abdominal: Soft. Bowel sounds are normal. She exhibits no distension. There is no tenderness. There is no rebound.  Musculoskeletal: Normal range of motion. She exhibits no edema or tenderness.  Neurological: She is alert and oriented to person, place, and time. No cranial nerve deficit.  Skin: No rash noted. No erythema.  Psychiatric: Affect normal.   LABORATORY PANEL:  Female CBC Recent Labs  Lab 06/30/17 0422  WBC 14.5*  HGB 11.5*  HCT 35.9  PLT 231   ------------------------------------------------------------------------------------------------------------------ Chemistries  Recent Labs  Lab 06/29/17 0412 06/30/17 0422  NA 140 140  K 3.9 3.8  CL 104 106  CO2 26 26  GLUCOSE 129* 166*  BUN 20 25*  CREATININE 0.53 0.83  CALCIUM 8.7* 9.1  MG  --  2.3  AST 22  --   ALT 16  --   ALKPHOS 52  --   BILITOT 0.3  --    RADIOLOGY:  No results found. ASSESSMENT AND PLAN:   This is a 57 year old female admitted for respiratory failure.  1.    Acute on chronic respiratory failure with hypoxia and hypercapnia due to COPD exacerbation.  Continue Solu-Medrol and NEB.  Continue inhaled corticosteroid as well as Spiriva.  3.  CAD: Stable; continue aspirin 4.  CHF: Diastolic; chronic.  Continue Coreg and enalapril  Dehydration.  Encourage fluid oral intake. Tobacco abuse.  Smoking cessation was counseled  Moderate Malnutrition related to chronic illness.  Follow dietitian recommendation. Generalized weakness.  PT evaluation suggest home health and PT.  Discussed with Dr. Jefferson Fuel. All the records are reviewed and case discussed with Care Management/Social Worker. Management plans discussed with the patient, family and they are in agreement.  CODE STATUS: Full  Code  TOTAL TIME TAKING CARE OF THIS PATIENT: 33 minutes.   More than 50% of the time was spent in counseling/coordination of care: YES  POSSIBLE D/C IN 2 DAYS, DEPENDING ON CLINICAL CONDITION.   Demetrios Loll M.D on 06/30/2017 at 2:49 PM  Between 7am to 6pm - Pager - [phone]  After 6pm go to [email or web address] - [facility]

## 2017-06-30 NOTE — Progress Notes (Signed)
Patient arrived to unit from ICU.

## 2017-06-30 NOTE — Progress Notes (Signed)
Pt w/ decreased breath sounds, increased WOB, and tachypnic after being up to [facility].  Moving little air on assessment, SaO2 WNL.  RT contacted for medneb tx.

## 2017-06-30 NOTE — Clinical Social Work Note (Signed)
CSW received consult for home equipment and medications. This consult would be the role of the RNCM who already has an active consult for this patient. CSW is signing off. Please consult should needs arise.  Santiago Bumpers, MSW, Lat[facility] Presser [phone]

## 2017-07-01 LAB — GLUCOSE, CAPILLARY: Glucose-Capillary: 219 mg/dL — ABNORMAL HIGH (ref 65–99)

## 2017-07-01 MED ORDER — METHYLPREDNISOLONE SODIUM SUCC 40 MG IJ SOLR
40.0000 mg | Freq: Two times a day (BID) | INTRAMUSCULAR | Status: DC
Start: 2017-07-01 — End: 2017-07-02
  Administered 2017-07-01 – 2017-07-02 (×2): 40 mg via INTRAVENOUS
  Filled 2017-07-01 (×2): qty 1

## 2017-07-01 MED ORDER — OXYCODONE-ACETAMINOPHEN 5-325 MG PO TABS: 1.0000 | ORAL_TABLET | Freq: Four times a day (QID) | ORAL | Status: DC | PRN

## 2017-07-01 NOTE — Progress Notes (Signed)
Palliative Medicine consult noted. Due to high referral volume, there may be a delay seeing this patient. Please call the Palliative Medicine Team office at [phone] if recommendations are needed in the interim.  Thank you for inviting [place] to see this patient.  Marjie Skiff Darren Nodal, RN, BSN, [facility] Palliative Medicine Team 07/01/2017 1:04 PM Office [phone]

## 2017-07-01 NOTE — Progress Notes (Signed)
OT Cancellation Note  Patient Details Name: Michelle [name] MRN: [id number] DOB: [birth year]   Cancelled Treatment:    Reason Eval/Treat Not Completed: Other (comment). On 2nd attempt, pt completing breathing treatment. Will re-attempt at later date/time as pt is available.  Jeni Salles, MPH, MS, OTR/L ascom [phone] 07/01/17, 2:29 PM

## 2017-07-01 NOTE — Progress Notes (Signed)
[place] [facility] [place] NAME: Michelle [name]    MR#:  [id number]  DATE OF BIRTH:  [birth year]  SUBJECTIVE:  CHIEF COMPLAINT:   Chief Complaint  Patient presents with  . Respiratory Distress   still shortness of breath, on oxygen 2 L by nasal cannula. REVIEW OF SYSTEMS:  Review of Systems  Constitutional: Positive for malaise/fatigue. Negative for chills and fever.  HENT: Negative for sore throat.   Eyes: Negative for blurred vision and double vision.  Respiratory: Positive for cough and shortness of breath. Negative for hemoptysis, sputum production, wheezing and stridor.   Cardiovascular: Negative for chest pain, palpitations, orthopnea and leg swelling.  Gastrointestinal: Negative for abdominal pain, blood in stool, diarrhea, melena, nausea and vomiting.  Genitourinary: Negative for dysuria, flank pain and hematuria.  Musculoskeletal: Negative for back pain and joint pain.  Skin: Negative for rash.  Neurological: Positive for weakness. Negative for dizziness, sensory change, focal weakness, seizures, loss of consciousness and headaches.  Endo/Heme/Allergies: Negative for polydipsia.  Psychiatric/Behavioral: Negative for depression. The patient is not nervous/anxious.     DRUG ALLERGIES:  No Active Allergies VITALS:  Blood pressure (!) 146/95, pulse 76, temperature 98.3 F (36.8 C), temperature source Oral, resp. rate 20, height 4\' 9"  (1.448 m), weight 106 lb 3.2 oz (48.2 kg), last menstrual period 03/06/2005, SpO2 100 %. PHYSICAL EXAMINATION:  Physical Exam  Constitutional: She is oriented to person, place, and time.  Severe malnutrition.  HENT:  Head: Normocephalic.  Mouth/Throat: Oropharynx is clear and moist.  Eyes: Conjunctivae and EOM are normal. Pupils are equal, round, and reactive to light. No scleral icterus.  Neck: Normal range of motion. Neck supple. No JVD present. No tracheal deviation present.  Cardiovascular: Normal  rate, regular rhythm and normal heart sounds. Exam reveals no gallop.  No murmur heard. Pulmonary/Chest: Effort normal. No respiratory distress. She has wheezes. She has no rales.  Very diminished lung sounds.  Abdominal: Soft. Bowel sounds are normal. She exhibits no distension. There is no tenderness. There is no rebound.  Musculoskeletal: Normal range of motion. She exhibits no edema or tenderness.  Neurological: She is alert and oriented to person, place, and time. No cranial nerve deficit.  Skin: No rash noted. No erythema.  Psychiatric: Affect normal.   LABORATORY PANEL:  Female CBC Recent Labs  Lab 06/30/17 0422  WBC 14.5*  HGB 11.5*  HCT 35.9  PLT 231   ------------------------------------------------------------------------------------------------------------------ Chemistries  Recent Labs  Lab 06/29/17 0412 06/30/17 0422  NA 140 140  K 3.9 3.8  CL 104 106  CO2 26 26  GLUCOSE 129* 166*  BUN 20 25*  CREATININE 0.53 0.83  CALCIUM 8.7* 9.1  MG  --  2.3  AST 22  --   ALT 16  --   ALKPHOS 52  --   BILITOT 0.3  --    RADIOLOGY:  No results found. ASSESSMENT AND PLAN:   This is a 57 year old female admitted for respiratory failure.  1.    Acute on chronic respiratory failure with hypoxia and hypercapnia due to COPD exacerbation.  Taper Solu-Medrol and continue NEB.  Continue inhaled corticosteroid as well as Spiriva.  3.  CAD: Stable; continue aspirin 4.  CHF: Diastolic; chronic.  Continue Coreg and enalapril  Dehydration.  Encourage fluid oral intake. Tobacco abuse.  Smoking cessation was counseled  Moderate Malnutrition related to chronic illness.  Follow dietitian recommendation. Generalized weakness.  PT evaluation suggest home health and PT.  All the records are reviewed and case discussed with Care Management/Social Worker. Management plans discussed with the patient, family and they are in agreement.  CODE STATUS: Full Code  TOTAL TIME TAKING CARE  OF THIS PATIENT: 33 minutes.   More than 50% of the time was spent in counseling/coordination of care: YES  POSSIBLE D/C IN 2 DAYS, DEPENDING ON CLINICAL CONDITION.   Demetrios Loll M.D on 07/01/2017 at 2:48 PM  Between 7am to 6pm - Pager - [phone]  After 6pm go to [email or web address] - [facility]

## 2017-07-01 NOTE — Progress Notes (Signed)
OT Cancellation Note  Patient Details Name: Michelle [name] MRN: [id number] DOB: [birth year]   Cancelled Treatment:    Reason Eval/Treat Not Completed: Patient declined, no reason specified. Order received, chart reviewed. Upon attempt, pt declines due to fatigue, requests OT come back at later time to re-attempt OT evaluation.   Jeni Salles, MPH, MS, OTR/L ascom [phone] 07/01/17, 9:20 AM

## 2017-07-01 NOTE — Evaluation (Signed)
Occupational Therapy Evaluation Patient Details Name: Michelle [name] MRN: [id number] DOB: 21-Jan-1961 Today's Date: 07/01/2017    History of Present Illness 57yo female pt presented to ER secondary to respiratory distress; admitted with acute on chronic respiratory failure with hypoxia, hypercapnia secondary to COPD exacerbation.  Initially requiring BiPAP, now weaned to 2.5L (baseline) via [place].   Clinical Impression   Pt seen for OT evaluation this date. Pt was relatively independent with ADL tasks as home, requiring assist for tub transfers and bathing on PRN basis from daughter and daughter takes care of driving, cleaning, groceries, and med mgt. Pt enjoys cooking and gardening but hasn't been able to participate in these meaningful occupations due to SOB/fatigue. Pt educated in energy conservation strategies including pursed lip breathing, activity pacing, work simplification, AE/DME, falls prevention, and home/routines modifications to maximize safety and functional independence while minimizing SOB. Handout provided. Pt verbalized understanding. Specific strategies provided to improve participation while decreasing SOB during cooking and gardening. Pt very thankful for education/training. PT will benefit from continued OT services to address impairments in cardiopulmonary status, activity tolerance, and knowledge of AE/DME. Recommend a shower chair for pt to use to improve safety with tub showers and encouraged pt to use BSC over toilet to increase height to maximize safety. Recommend HHOT with follow up pulmonary rehab, which pt stated she would consider.     Follow Up Recommendations  Home health OT(and follow up pulmonary rehab)    Equipment Recommendations  Tub/shower seat;Other (comment)(tub rail, reacher, furniture risers)    Recommendations for Other Services       Precautions / Restrictions Precautions Precautions: Fall Restrictions Weight Bearing Restrictions: No       Mobility Bed Mobility Overal bed mobility: Modified Independent                Transfers Overall transfer level: Needs assistance Equipment used: None Transfers: Sit to/from Stand Sit to Stand: Supervision              Balance Overall balance assessment: Needs assistance Sitting-balance support: No upper extremity supported;Feet supported Sitting balance-Leahy Scale: Good     Standing balance support: No upper extremity supported Standing balance-Leahy Scale: Fair                             ADL either performed or assessed with clinical judgement   ADL Overall ADL's : Modified independent                                       General ADL Comments: increased effort/SOB with LB ADL tasks. Pt educated in AE/DME and body positioning strategies to improve independence while minimizing SOB for dressing and bathing tasks. Pt verbalized understanding.      Vision Baseline Vision/History: Wears glasses Wears Glasses: Reading only Patient Visual Report: No change from baseline       Perception     Praxis      Pertinent Vitals/Pain Pain Assessment: No/denies pain     Hand Dominance     Extremity/Trunk Assessment Upper Extremity Assessment Upper Extremity Assessment: Overall WFL for tasks assessed   Lower Extremity Assessment Lower Extremity Assessment: Overall WFL for tasks assessed   Cervical / Trunk Assessment Cervical / Trunk Assessment: Normal   Communication Communication Communication: No difficulties   Cognition Arousal/Alertness: Awake/alert Behavior During Therapy: WFL for tasks assessed/performed Overall  Cognitive Status: Within Functional Limits for tasks assessed                                     General Comments       Exercises Other Exercises Other Exercises: Pt educated in energy conservation strategies including pursed lip breathing, activity pacing, work simplification, AE/DME, falls  prevention, and home/routines modifications to maximize safety and functional independence while minimizing SOB. Handout provided. Pt verbalized understanding.    Shoulder Instructions      Home Living Family/patient expects to be discharged to:: Private residence Living Arrangements: Children Available Help at Discharge: Family;Available 24 hours/day Type of Home: House Home Access: Stairs to enter [employer] of Steps: 4 Entrance Stairs-Rails: Right;Left;Can reach both Home Layout: One level     Bathroom Shower/Tub: Teacher, early years/pre: Standard     Home Equipment: Cane - single point;Hand held shower head;Bedside commode          Prior Functioning/Environment Level of Independence: Needs assistance  Gait / Transfers Assistance Needed: Indep with transfers and household mobility; limited mobility in community (due to respiratory status).   ADL's / Homemaking Assistance Needed: Daughters assist with ADLs as needed (bathing and tub transfers) and maintain responsibility for household chores, driving, med mgt. Pt does light meal prep when has the energy, used to enjoy more cooking and gardening but hasn't had the energy.   Comments: no falls in past 12 months        OT Problem List: Decreased knowledge of use of DME or AE;Decreased activity tolerance;Cardiopulmonary status limiting activity      OT Treatment/Interventions: Self-care/ADL training;Therapeutic exercise;Therapeutic activities;Energy conservation;DME and/or AE instruction;Patient/family education    OT Goals(Current goals can be found in the care plan section) Acute Rehab OT Goals Patient Stated Goal: to return home OT Goal Formulation: With patient Time For Goal Achievement: 07/15/17 Potential to Achieve Goals: Good ADL Goals Pt Will Transfer to Toilet: with supervision;ambulating(comfort height toilet, utilizing PLB, O2 sats >90% on 2.5L) Additional ADL Goal #1: Pt will verbalize  plan for implementing at least 1 learned ECS to support safety and independence with meaningful ADL and IADL.  OT Frequency: Min 1X/week   Barriers to D/C:            Co-evaluation              AM-PAC PT "6 Clicks" Daily Activity     Outcome Measure Help from another person eating meals?: None Help from another person taking care of personal grooming?: None Help from another person toileting, which includes using toliet, bedpan, or urinal?: A Little Help from another person bathing (including washing, rinsing, drying)?: A Little Help from another person to put on and taking off regular upper body clothing?: None Help from another person to put on and taking off regular lower body clothing?: A Little 6 Click Score: 21   End of Session    Activity Tolerance: Patient tolerated treatment well Patient left: in bed;with call bell/phone within reach;with bed alarm set  OT Visit Diagnosis: Other abnormalities of gait and mobility (R26.89)                Time: 1540-0867 OT Time Calculation (min): 30 min Charges:  OT General Charges $OT Visit: 1 Visit OT Evaluation $OT Eval Low Complexity: 1 Low OT Treatments $Self Care/Home Management : 23-37 mins  Jeni Salles, MPH, MS, OTR/L  ascom [phone] 07/01/17, 3:41 PM

## 2017-07-02 MED ORDER — ENOXAPARIN SODIUM 40 MG/0.4ML ~~LOC~~ SOLN: 40.0000 mg | SUBCUTANEOUS | Status: DC

## 2017-07-02 MED ORDER — ALPRAZOLAM 0.5 MG PO TABS: 0.5000 mg | ORAL_TABLET | Freq: Three times a day (TID) | ORAL | 0 refills | Status: DC

## 2017-07-02 MED ORDER — PREDNISONE 10 MG PO TABS: ORAL_TABLET | ORAL | 0 refills | Status: DC

## 2017-07-02 MED ORDER — GUAIFENESIN-CODEINE 100-10 MG/5ML PO SOLN: 10.0000 mL | ORAL | 0 refills | Status: DC | PRN

## 2017-07-02 NOTE — Discharge Summary (Signed)
[facility] [place] NAME: Michelle [name]    MR#:  [id number]  DATE OF BIRTH:  09-17-1960  DATE OF ADMISSION:  06/29/2017   ADMITTING PHYSICIAN: Harrie Foreman, MD  DATE OF DISCHARGE: 07/02/2017  PRIMARY CARE PHYSICIAN: Center, [facility]   ADMISSION DIAGNOSIS:  Acute respiratory distress [R06.03] DISCHARGE DIAGNOSIS:  Active Problems:   Acute on chronic respiratory failure with hypoxia and hypercapnia ([place])  SECONDARY DIAGNOSIS:   Past Medical History:  Diagnosis Date  . Allergy   . Anxiety   . Asthma   . CHF (congestive heart failure) ([place])   . Cocaine abuse ([place])   . COPD (chronic obstructive pulmonary disease) ([place])   . Coronary artery disease   . Emphysema/COPD ([place])   . Hepatitis C   . High cholesterol   . Hypertension   . Pneumothorax   . Tobacco abuse    HOSPITAL COURSE:   This is a 57 year old female admitted for respiratory failure.  1.   Acute on chronic respiratory failure with hypoxia and hypercapnia due to COPD exacerbation.  Tapered Solu-Medrol, change to prednisone, continue NEB. Continue inhaled corticosteroid as well as Spiriva.  3. CAD: Stable; continue aspirin 4. CHF: Diastolic; chronic. Continue Coreg and enalapril  Dehydration.  Encourage fluid oral intake. Tobacco abuse. Smoking cessation was counseled  Moderate Malnutritionrelated to chronic illness.  Follow dietitian recommendation. Generalized weakness.  PT evaluation suggest home health and PT. DISCHARGE CONDITIONS:  Stable, discharge to home with home health and PT today. CONSULTS OBTAINED:   DRUG ALLERGIES:  No Active Allergies DISCHARGE MEDICATIONS:   Allergies as of 07/02/2017   No Active Allergies     Medication List    STOP taking these medications   azithromycin 250 MG tablet Commonly known as:  ZITHROMAX   feeding supplement (ENSURE ENLIVE) Liqd   guaiFENesin 600 MG 12 hr tablet Commonly known as:   MUCINEX   guaiFENesin-dextromethorphan 100-10 MG/5ML syrup Commonly known as:  ROBITUSSIN DM   LORazepam 1 MG tablet Commonly known as:  ATIVAN   mometasone-formoterol 200-5 MCG/ACT Aero Commonly known as:  DULERA   nicotine 14 mg/24hr patch Commonly known as:  NICODERM CQ - dosed in mg/24 hours   predniSONE 10 MG (21) Tbpk tablet Commonly known as:  STERAPRED UNI-PAK 21 TAB Replaced by:  predniSONE 10 MG tablet     TAKE these medications   albuterol 108 (90 Base) MCG/ACT inhaler Commonly known as:  PROVENTIL HFA;VENTOLIN HFA Inhale 2 puffs into the lungs every 6 (six) hours as needed for wheezing or shortness of breath.   ALPRAZolam 0.5 MG tablet Commonly known as:  XANAX Take 1 tablet (0.5 mg total) by mouth every 8 (eight) hours.   aspirin EC 81 MG tablet Take 81 mg by mouth daily.   carvedilol 3.125 MG tablet Commonly known as:  COREG Take 1 tablet (3.125 mg total) by mouth every morning. What changed:  when to take this   enalapril 10 MG tablet Commonly known as:  VASOTEC Take 0.5 tablets (5 mg total) by mouth daily.   guaiFENesin-codeine 100-10 MG/5ML syrup Take 10 mLs by mouth every 4 (four) hours as needed for cough.   ipratropium-albuterol 0.5-2.5 (3) MG/3ML Soln Commonly known as:  DUONEB Take 3 mLs by nebulization every 6 (six) hours as needed.   predniSONE 10 MG tablet Commonly known as:  DELTASONE 40 mg po daily 1 day, 30 mg po daily 1 day, 20 mg  po daily 1 day, 10 mg po daily 1 day. Replaces:  predniSONE 10 MG (21) Tbpk tablet   SYMBICORT 160-4.5 MCG/ACT inhaler Generic drug:  budesonide-formoterol Take 2 puffs by mouth 2 (two) times daily.   tiotropium 18 MCG inhalation capsule Commonly known as:  SPIRIVA Place 1 capsule (18 mcg total) into inhaler and inhale daily.        DISCHARGE INSTRUCTIONS:  See AVS.  If you experience worsening of your admission symptoms, develop shortness of breath, life threatening emergency, suicidal or  homicidal thoughts you must seek medical attention immediately by calling 911 or calling your MD immediately  if symptoms less severe.  You Must read complete instructions/literature along with all the possible adverse reactions/side effects for all the Medicines you take and that have been prescribed to you. Take any new Medicines after you have completely understood and accpet all the possible adverse reactions/side effects.   Please note  You were cared for by a hospitalist during your hospital stay. If you have any questions about your discharge medications or the care you received while you were in the hospital after you are discharged, you can call the unit and asked to speak with the hospitalist on call if the hospitalist that took care of you is not available. Once you are discharged, your primary care physician will handle any further medical issues. Please note that NO REFILLS for any discharge medications will be authorized once you are discharged, as it is imperative that you return to your primary care physician (or establish a relationship with a primary care physician if you do not have one) for your aftercare needs so that they can reassess your need for medications and monitor your lab values.    On the day of Discharge:  VITAL SIGNS:  Blood pressure (!) 145/83, pulse 73, temperature 98.2 F (36.8 C), temperature source Oral, resp. rate 20, height 4\' 9"  (1.448 m), weight 109 lb 8 oz (49.7 kg), last menstrual period 03/06/2005, SpO2 98 %. PHYSICAL EXAMINATION:  GENERAL:  57 y.o.-year-old patient lying in the bed with no acute distress.  EYES: Pupils equal, round, reactive to light and accommodation. No scleral icterus. Extraocular muscles intact.  HEENT: Head atraumatic, normocephalic. Oropharynx and nasopharynx clear.  NECK:  Supple, no jugular venous distention. No thyroid enlargement, no tenderness.  LUNGS: Diminished l breath sounds bilaterally, no wheezing, rales,rhonchi or  crepitation. No use of accessory muscles of respiration.  CARDIOVASCULAR: S1, S2 normal. No murmurs, rubs, or gallops.  ABDOMEN: Soft, non-tender, non-distended. Bowel sounds present. No organomegaly or mass.  EXTREMITIES: No pedal edema, cyanosis, or clubbing.  NEUROLOGIC: Cranial nerves II through XII are intact. Muscle strength 4/5 in all extremities. Sensation intact. Gait not checked.  PSYCHIATRIC: The patient is alert and oriented x 3.  SKIN: No obvious rash, lesion, or ulcer.  DATA REVIEW:   CBC Recent Labs  Lab 06/30/17 0422  WBC 14.5*  HGB 11.5*  HCT 35.9  PLT 231    Chemistries  Recent Labs  Lab 06/29/17 0412 06/30/17 0422  NA 140 140  K 3.9 3.8  CL 104 106  CO2 26 26  GLUCOSE 129* 166*  BUN 20 25*  CREATININE 0.53 0.83  CALCIUM 8.7* 9.1  MG  --  2.3  AST 22  --   ALT 16  --   ALKPHOS 52  --   BILITOT 0.3  --      Microbiology Results  Results for orders placed or performed during the  hospital encounter of 06/29/17  MRSA PCR Screening     Status: None   Collection Time: 06/29/17  6:27 AM  Result Value Ref Range Status   MRSA by PCR NEGATIVE NEGATIVE Final    Comment:        The GeneXpert MRSA Assay (FDA approved for NASAL specimens only), is one component of a comprehensive MRSA colonization surveillance program. It is not intended to diagnose MRSA infection nor to guide or monitor treatment for MRSA infections. Performed at [facility], [address], [place], [place] [address]     RADIOLOGY:  No results found.   Management plans discussed with the patient, family and they are in agreement.  CODE STATUS: Full Code   TOTAL TIME TAKING CARE OF THIS PATIENT: 32 minutes.    Demetrios Loll M.D on 07/02/2017 at 3:05 PM  Between 7am to 6pm - Pager - [phone]  After 6pm go to [email or web address] - Technical brewer [facility]  Office  [phone]  CC: Primary care physician; Center, [facility]   Note: This dictation was prepared with Dragon dictation along with smaller phrase technology. Any transcriptional errors that result from this process are unintentional.

## 2017-07-02 NOTE — Discharge Instructions (Signed)
Heart healthy diet. °HHPT °

## 2017-07-02 NOTE — Progress Notes (Signed)
Anticoagulation monitoring(Lovenox):  57yo  F ordered Lovenox 30 mg Q24h  Filed Weights   06/29/17 0610 07/01/17 0443 07/02/17 0500  Weight: 97 lb 3.6 oz (44.1 kg) 106 lb 3.2 oz (48.2 kg) 109 lb 8 oz (49.7 kg)   BMI 23   Lab Results  Component Value Date   CREATININE 0.83 06/30/2017   CREATININE 0.53 06/29/2017   CREATININE 0.55 02/25/2017   Estimated Creatinine Clearance: 51.4 mL/min (by C-G formula based on SCr of 0.83 mg/dL). Hemoglobin & Hematocrit     Component Value Date/Time   HGB 11.5 (L) 06/30/2017 0422   HGB 12.4 07/11/2014 0434   HCT 35.9 06/30/2017 0422   HCT 38.9 07/11/2014 0434     Per Protocol for Patient with estCrcl > 30 ml/min and BMI < 40, will transition to Lovenox 40 mg Q24h     Chinita Greenland PharmD Clinical Pharmacist 07/02/2017

## 2017-07-02 NOTE — Care Management Note (Signed)
Case Management Note  Patient Details  Name: JAILINE LIEDER MRN: [id number] Date of Birth: [birth year]  Subjective/Objective:  Met with patient at bedside to discuss home health services. Patient states she has had home health in the past with [facility] home health and she doesn't feel like she needs it. " I know what I need to do" Patient lives with her two daughters that can help. She states she know how to utilize her resources in the event she has an exacerbation. I strongly encouraged her to at least accept the nurse for respiratory assessment but she refused. Patient on 2.5L chronic O2 through [facility]. Will close case                  Action/Plan:   Expected Discharge Date:  07/02/17               Expected Discharge Plan:  Home/Self Care  In-House Referral:     Discharge planning Services  CM Consult  Post Acute Care Choice:    Choice offered to:  Patient  DME Arranged:    DME Agency:     HH Arranged:  Patient Refused [place] Agency:     Status of Service:  Completed, signed off  If discussed at [employer] of Stay Meetings, dates discussed:    Additional Comments:  Jolly Mango, RN 07/02/2017, 9:38 AM

## 2017-07-02 NOTE — Progress Notes (Signed)
Chaplain was rounding and stopped by because he remembered a previous order. Chaplain prayed with Pt as she prepared to be release.    07/02/17 1300  Clinical Encounter Type  Visited With Patient  Visit Type Spiritual support  Referral From Nurse  Spiritual Encounters  Spiritual Needs Prayer

## 2017-07-23 ENCOUNTER — Other Ambulatory Visit: Payer: Self-pay | Admitting: Nurse Practitioner

## 2017-07-23 DIAGNOSIS — Z1239 Encounter for other screening for malignant neoplasm of breast: Secondary | ICD-10-CM

## 2017-07-26 ENCOUNTER — Other Ambulatory Visit: Payer: Self-pay

## 2017-07-26 ENCOUNTER — Telehealth: Payer: Self-pay

## 2017-07-26 DIAGNOSIS — Z1211 Encounter for screening for malignant neoplasm of colon: Secondary | ICD-10-CM

## 2017-07-26 MED ORDER — PEG 3350-KCL-NABCB-NACL-NASULF 236 G PO SOLR: ORAL | 0 refills | Status: DC

## 2017-07-26 NOTE — Telephone Encounter (Signed)
Gastroenterology Pre-Procedure Review  Request Date:  Requesting Physician: Dr.   PATIENT REVIEW QUESTIONS: The patient responded to the following health history questions as indicated:    1. Are you having any GI issues? no 2. Do you have a personal history of Polyps? no 3. Do you have a family history of Colon Cancer or Polyps? no 4. Diabetes Mellitus? no 5. Joint replacements in the past 12 months?no 6. Major health problems in the past 3 months?no 7. Any artificial heart valves, MVP, or defibrillator?yes (Stent placed on 2010)    MEDICATIONS & ALLERGIES:    Patient reports the following regarding taking any anticoagulation/antiplatelet therapy:   Plavix, Coumadin, Eliquis, Xarelto, Lovenox, Pradaxa, Brilinta, or Effient? no Aspirin? yes (ASA 81mg )  Patient confirms/reports the following medications:  Current Outpatient Medications  Medication Sig Dispense Refill  . albuterol (PROVENTIL HFA;VENTOLIN HFA) 108 (90 Base) MCG/ACT inhaler Inhale 2 puffs into the lungs every 6 (six) hours as needed for wheezing or shortness of breath. 1 Inhaler 2  . ALPRAZolam (XANAX) 0.5 MG tablet Take 1 tablet (0.5 mg total) by mouth every 8 (eight) hours. 15 tablet 0  . aspirin EC 81 MG tablet Take 81 mg by mouth daily.    . carvedilol (COREG) 3.125 MG tablet Take 1 tablet (3.125 mg total) by mouth every morning. (Patient taking differently: Take 3.125 mg by mouth 2 (two) times daily with a meal. ) 60 tablet 2  . enalapril (VASOTEC) 10 MG tablet Take 0.5 tablets (5 mg total) by mouth daily. 30 tablet 2  . guaiFENesin-codeine 100-10 MG/5ML syrup Take 10 mLs by mouth every 4 (four) hours as needed for cough. 118 mL 0  . ipratropium-albuterol (DUONEB) 0.5-2.5 (3) MG/3ML SOLN Take 3 mLs by nebulization every 6 (six) hours as needed. 360 mL 0  . predniSONE (DELTASONE) 10 MG tablet 40 mg po daily 1 day, 30 mg po daily 1 day, 20 mg po daily 1 day, 10 mg po daily 1 day. 10 tablet 0  . SYMBICORT 160-4.5 MCG/ACT  inhaler Take 2 puffs by mouth 2 (two) times daily.    Marland Kitchen tiotropium (SPIRIVA) 18 MCG inhalation capsule Place 1 capsule (18 mcg total) into inhaler and inhale daily. 30 capsule 0   No current facility-administered medications for this visit.     Patient confirms/reports the following allergies:  No Active Allergies  No orders of the defined types were placed in this encounter.   AUTHORIZATION INFORMATION Primary Insurance: 1D#: Group #:  Secondary Insurance: 1D#: Group #:  SCHEDULE INFORMATION: Date: 08/15/17 Time: Location: [facility]

## 2017-08-06 IMAGING — CR DG CHEST 2V
1 series · 2 of 2 positions shown · non-contrast
Comparison: 02/07/2016

CLINICAL DATA: Shortness of breath, cough, fever

EXAM:
CHEST  2 VIEW

[Series 1: dg chest 2 view · 0.14mm/px · 2 of 2 slices shown]
[im 1/2]
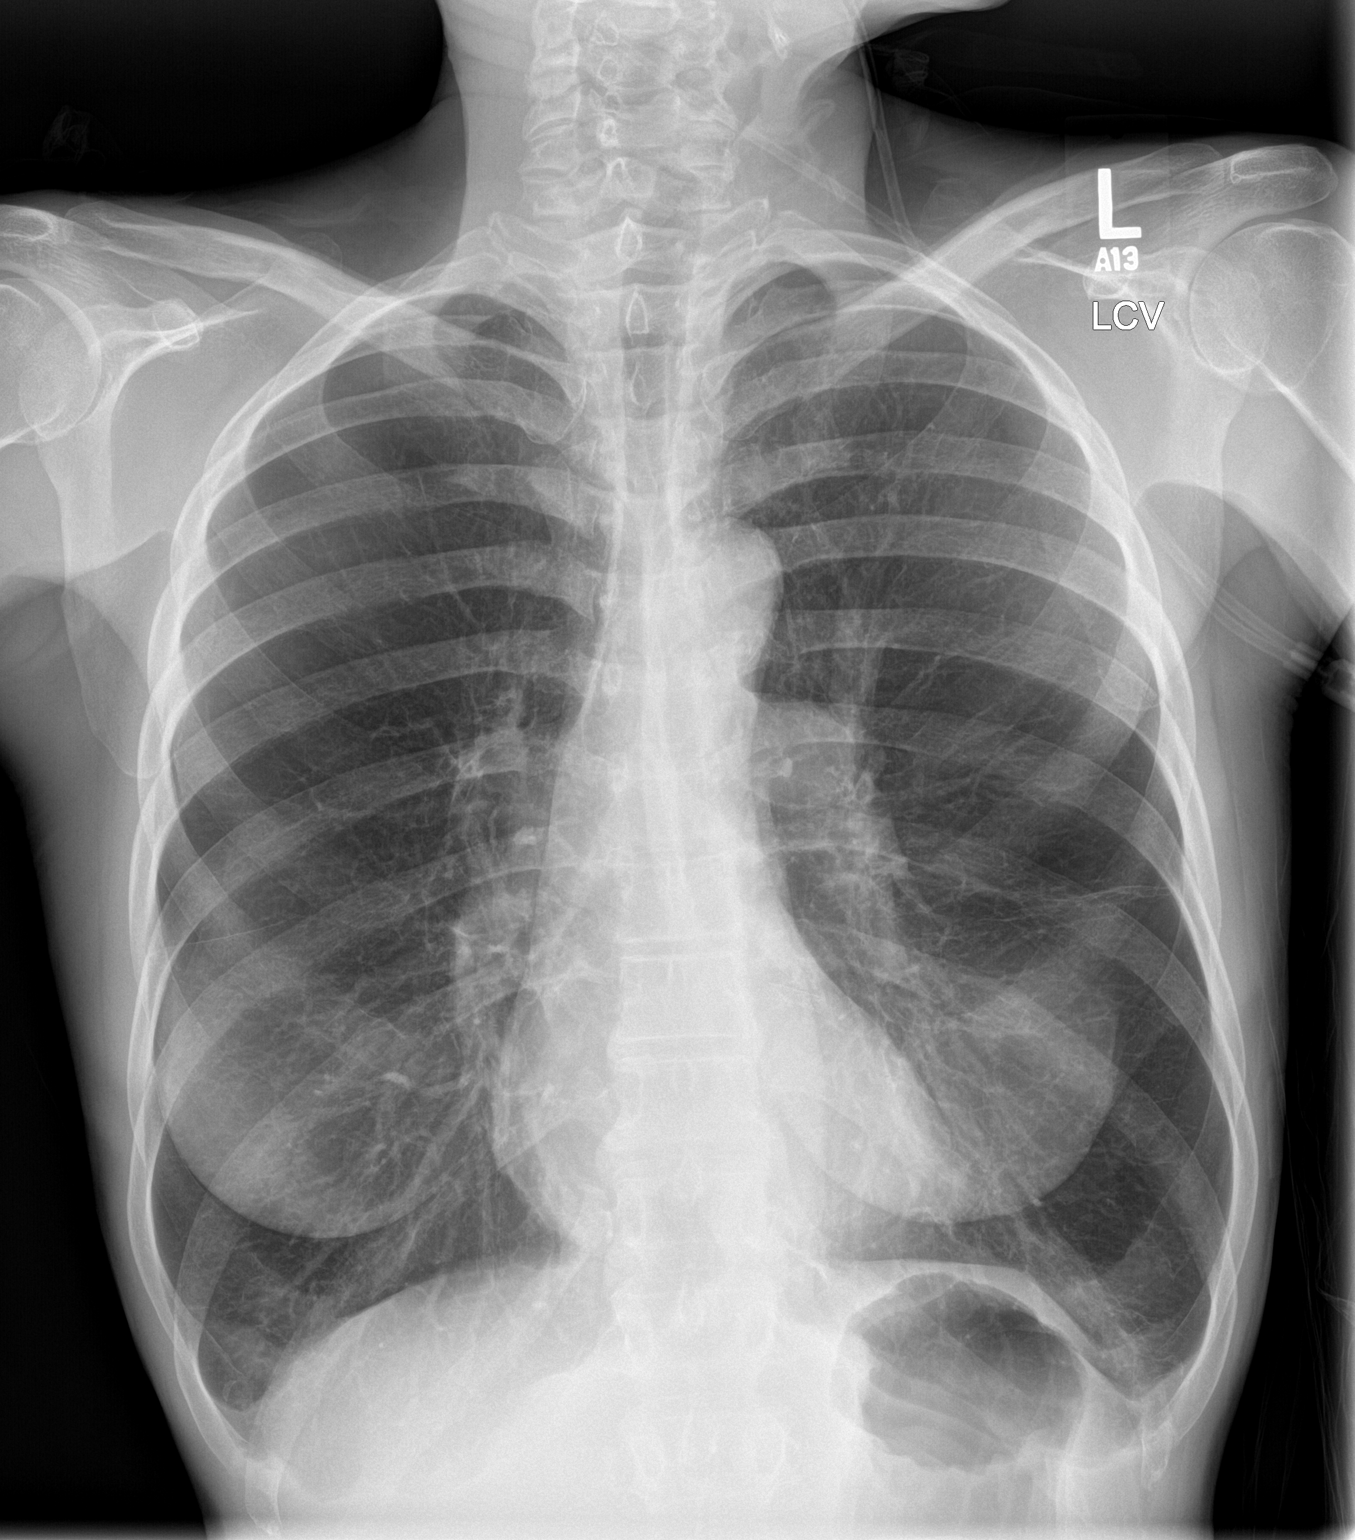
[im 2/2]
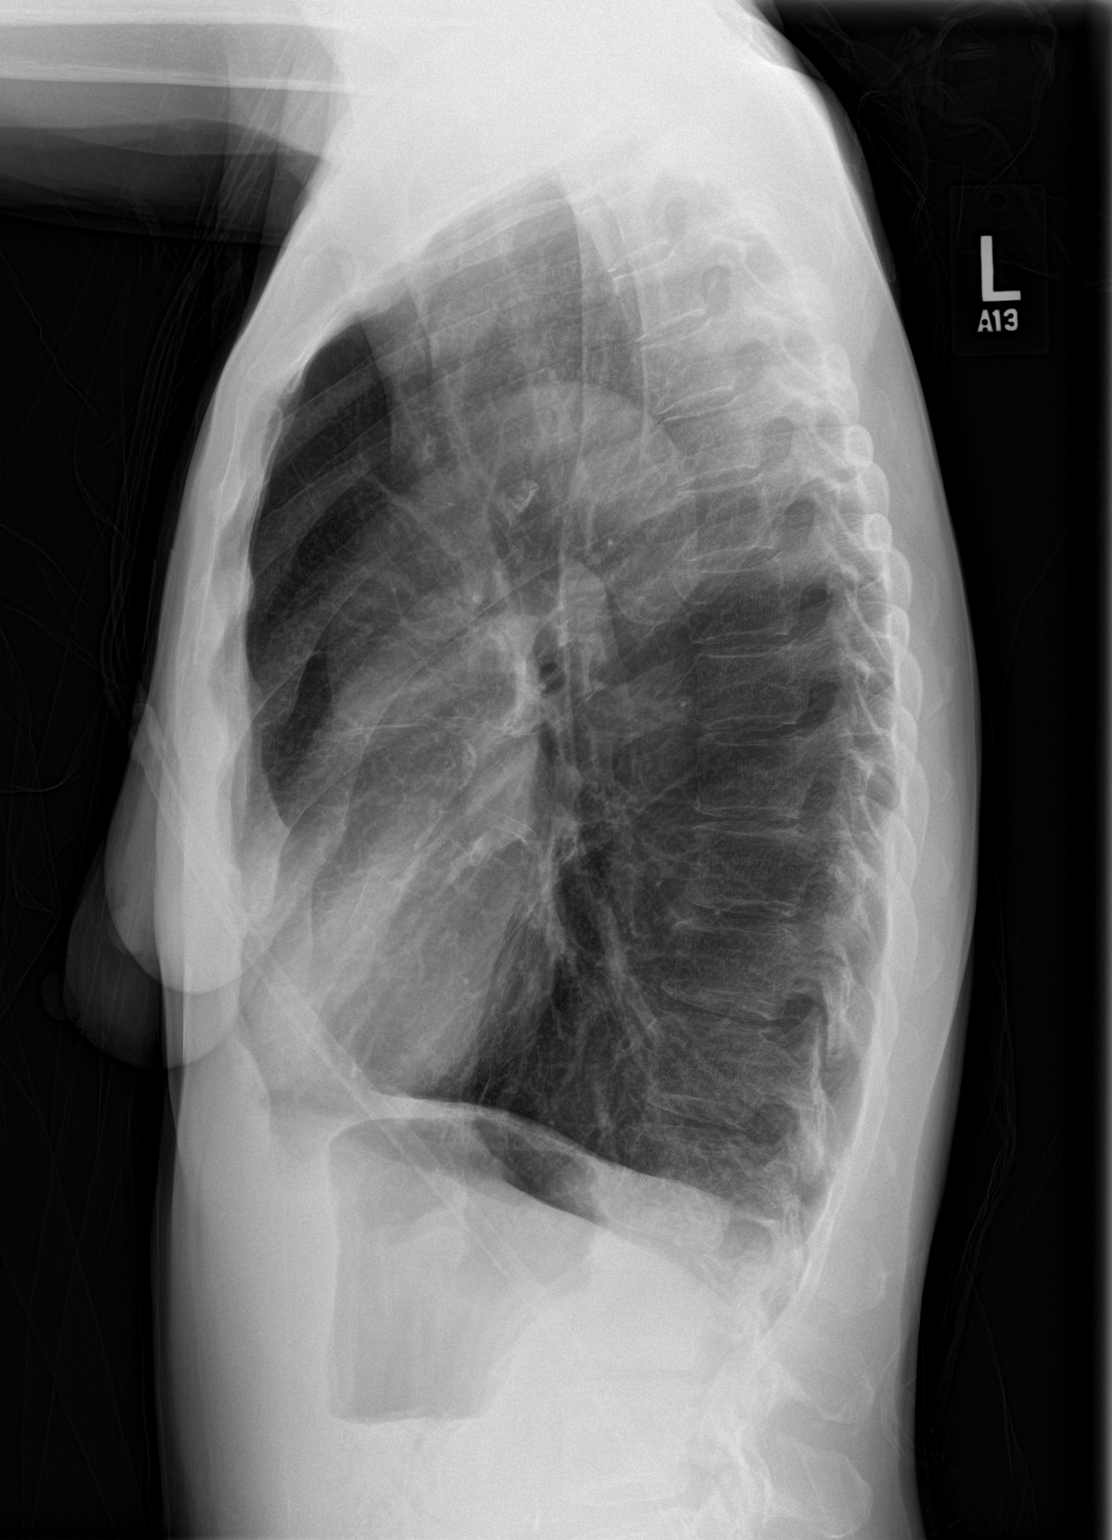

[2 of 2 positions shown; findings below may reference images not displayed]

FINDINGS: There is hyperinflation of the lungs compatible with COPD. Heart and
mediastinal contours are within normal limits. No focal opacities or
effusions. No acute bony abnormality.
IMPRESSION: COPD.  No active disease.

## 2017-08-09 ENCOUNTER — Telehealth: Payer: Self-pay | Admitting: Gastroenterology

## 2017-08-09 NOTE — Telephone Encounter (Signed)
Caryl Pina from [facility] downstairs left message pt is scheduled for Procedure 08-14-17 and needs to be rescheduled for [facility] not [facility] due to her being hospitalized January for respatory failure,

## 2017-08-13 ENCOUNTER — Other Ambulatory Visit: Payer: Self-pay

## 2017-08-13 DIAGNOSIS — Z1211 Encounter for screening for malignant neoplasm of colon: Secondary | ICD-10-CM

## 2017-08-13 NOTE — Telephone Encounter (Signed)
Left vm for pt to return my call to reschedule procedure from [facility] to [facility] on Thursday due to recent admission in January for respiratory failure.

## 2017-08-14 NOTE — Telephone Encounter (Signed)
Pt rescheduled for her colonoscopy to 08/22/17.

## 2017-08-15 ENCOUNTER — Ambulatory Visit: Admission: RE | Admit: 2017-08-15 | Payer: Medicaid Other | Source: Ambulatory Visit | Admitting: Gastroenterology

## 2017-08-15 ENCOUNTER — Encounter: Admission: RE | Payer: Self-pay | Source: Ambulatory Visit

## 2017-08-15 SURGERY — COLONOSCOPY WITH PROPOFOL
Anesthesia: Choice

## 2017-08-20 ENCOUNTER — Emergency Department: Payer: Medicaid Other

## 2017-08-20 ENCOUNTER — Encounter: Payer: Self-pay | Admitting: Emergency Medicine

## 2017-08-20 ENCOUNTER — Inpatient Hospital Stay
Admission: EM | Admit: 2017-08-20 | Discharge: 2017-08-24 | DRG: 193 | Disposition: A | Discharge status: Home Health | Payer: Medicaid Other | Attending: Internal Medicine | Admitting: Internal Medicine

## 2017-08-20 ENCOUNTER — Other Ambulatory Visit: Payer: Self-pay

## 2017-08-20 DIAGNOSIS — J9600 Acute respiratory failure, unspecified whether with hypoxia or hypercapnia: Secondary | ICD-10-CM

## 2017-08-20 DIAGNOSIS — J441 Chronic obstructive pulmonary disease with (acute) exacerbation: Secondary | ICD-10-CM | POA: Diagnosis present

## 2017-08-20 DIAGNOSIS — J9621 Acute and chronic respiratory failure with hypoxia: Secondary | ICD-10-CM | POA: Diagnosis present

## 2017-08-20 DIAGNOSIS — I5032 Chronic diastolic (congestive) heart failure: Secondary | ICD-10-CM | POA: Diagnosis present

## 2017-08-20 DIAGNOSIS — I110 Hypertensive heart disease with heart failure: Secondary | ICD-10-CM | POA: Diagnosis present

## 2017-08-20 DIAGNOSIS — J181 Lobar pneumonia, unspecified organism: Secondary | ICD-10-CM | POA: Diagnosis present

## 2017-08-20 DIAGNOSIS — Z7982 Long term (current) use of aspirin: Secondary | ICD-10-CM | POA: Diagnosis not present

## 2017-08-20 DIAGNOSIS — R0603 Acute respiratory distress: Secondary | ICD-10-CM

## 2017-08-20 DIAGNOSIS — R0602 Shortness of breath: Secondary | ICD-10-CM

## 2017-08-20 DIAGNOSIS — E440 Moderate protein-calorie malnutrition: Secondary | ICD-10-CM | POA: Diagnosis present

## 2017-08-20 DIAGNOSIS — Z6821 Body mass index (BMI) 21.0-21.9, adult: Secondary | ICD-10-CM | POA: Diagnosis not present

## 2017-08-20 DIAGNOSIS — F17210 Nicotine dependence, cigarettes, uncomplicated: Secondary | ICD-10-CM | POA: Diagnosis present

## 2017-08-20 DIAGNOSIS — I2510 Atherosclerotic heart disease of native coronary artery without angina pectoris: Secondary | ICD-10-CM | POA: Diagnosis present

## 2017-08-20 DIAGNOSIS — Z79899 Other long term (current) drug therapy: Secondary | ICD-10-CM | POA: Diagnosis not present

## 2017-08-20 DIAGNOSIS — E7800 Pure hypercholesterolemia, unspecified: Secondary | ICD-10-CM | POA: Diagnosis present

## 2017-08-20 DIAGNOSIS — J440 Chronic obstructive pulmonary disease with acute lower respiratory infection: Secondary | ICD-10-CM | POA: Diagnosis present

## 2017-08-20 DIAGNOSIS — F411 Generalized anxiety disorder: Secondary | ICD-10-CM | POA: Diagnosis present

## 2017-08-20 LAB — URINALYSIS, COMPLETE (UACMP) WITH MICROSCOPIC
BACTERIA UA: NONE SEEN
Bilirubin Urine: NEGATIVE
Glucose, UA: >=500 mg/dL — AB
Hgb urine dipstick: NEGATIVE
Ketones, ur: NEGATIVE mg/dL
Leukocytes, UA: NEGATIVE
Nitrite: NEGATIVE
PROTEIN: 100 mg/dL — AB
Specific Gravity, Urine: 1.032 — ABNORMAL HIGH (ref 1.005–1.030)
pH: 5 (ref 5.0–8.0)

## 2017-08-20 LAB — URINE DRUG SCREEN, QUALITATIVE (ARMC ONLY)
Amphetamines, Ur Screen: NOT DETECTED
Barbiturates, Ur Screen: NOT DETECTED
Benzodiazepine, Ur Scrn: POSITIVE — AB
CANNABINOID 50 NG, UR ~~LOC~~: NOT DETECTED
COCAINE METABOLITE, UR ~~LOC~~: POSITIVE — AB
MDMA (ECSTASY) UR SCREEN: NOT DETECTED
Methadone Scn, Ur: NOT DETECTED
Opiate, Ur Screen: POSITIVE — AB
PHENCYCLIDINE (PCP) UR S: NOT DETECTED
Tricyclic, Ur Screen: NOT DETECTED

## 2017-08-20 LAB — CBC WITH DIFFERENTIAL/PLATELET
Basophils Absolute: 0 10*3/uL (ref 0–0.1)
Basophils Relative: 0 %
Eosinophils Absolute: 0.1 10*3/uL (ref 0–0.7)
Eosinophils Relative: 1 %
HEMATOCRIT: 42.4 % (ref 35.0–47.0)
Hemoglobin: 13.9 g/dL (ref 12.0–16.0)
LYMPHS ABS: 1.5 10*3/uL (ref 1.0–3.6)
LYMPHS PCT: 14 %
MCH: 29 pg (ref 26.0–34.0)
MCHC: 32.8 g/dL (ref 32.0–36.0)
MCV: 88.2 fL (ref 80.0–100.0)
MONO ABS: 0.8 10*3/uL (ref 0.2–0.9)
MONOS PCT: 7 %
Neutro Abs: 8.5 10*3/uL — ABNORMAL HIGH (ref 1.4–6.5)
Neutrophils Relative %: 78 %
Platelets: 216 10*3/uL (ref 150–440)
RBC: 4.8 MIL/uL (ref 3.80–5.20)
RDW: 14 % (ref 11.5–14.5)
WBC: 10.9 10*3/uL (ref 3.6–11.0)

## 2017-08-20 LAB — BLOOD GAS, VENOUS
ACID-BASE EXCESS: 1.7 mmol/L (ref 0.0–2.0)
Bicarbonate: 27.7 mmol/L (ref 20.0–28.0)
Delivery systems: POSITIVE
FIO2: 0.4
O2 SAT: 85.3 %
PCO2 VEN: 48 mmHg (ref 44.0–60.0)
PO2 VEN: 52 mmHg — AB (ref 32.0–45.0)
Patient temperature: 37
pH, Ven: 7.37 (ref 7.250–7.430)

## 2017-08-20 LAB — LIPASE, BLOOD: Lipase: 23 U/L (ref 11–51)

## 2017-08-20 LAB — COMPREHENSIVE METABOLIC PANEL
ALBUMIN: 3.8 g/dL (ref 3.5–5.0)
ALK PHOS: 40 U/L (ref 38–126)
ALT: 13 U/L — ABNORMAL LOW (ref 14–54)
AST: 18 U/L (ref 15–41)
Anion gap: 7 (ref 5–15)
BILIRUBIN TOTAL: 0.5 mg/dL (ref 0.3–1.2)
BUN: 26 mg/dL — ABNORMAL HIGH (ref 6–20)
CALCIUM: 9 mg/dL (ref 8.9–10.3)
CO2: 28 mmol/L (ref 22–32)
Chloride: 105 mmol/L (ref 101–111)
Creatinine, Ser: 0.75 mg/dL (ref 0.44–1.00)
GLUCOSE: 93 mg/dL (ref 65–99)
Potassium: 3.8 mmol/L (ref 3.5–5.1)
Sodium: 140 mmol/L (ref 135–145)
TOTAL PROTEIN: 7.6 g/dL (ref 6.5–8.1)

## 2017-08-20 LAB — BRAIN NATRIURETIC PEPTIDE: B Natriuretic Peptide: 33 pg/mL (ref 0.0–100.0)

## 2017-08-20 LAB — MRSA PCR SCREENING: MRSA BY PCR: NEGATIVE

## 2017-08-20 LAB — INFLUENZA PANEL BY PCR (TYPE A & B)
INFLBPCR: NEGATIVE
Influenza A By PCR: NEGATIVE

## 2017-08-20 LAB — TROPONIN I

## 2017-08-20 MED ORDER — NICOTINE 21 MG/24HR TD PT24
21.0000 mg | MEDICATED_PATCH | Freq: Every day | TRANSDERMAL | Status: DC
  Administered 2017-08-21 – 2017-08-24 (×4): 21 mg via TRANSDERMAL
  Filled 2017-08-20 (×4): qty 1

## 2017-08-20 MED ORDER — ACETAMINOPHEN 650 MG RE SUPP: 650.0000 mg | Freq: Four times a day (QID) | RECTAL | Status: DC | PRN

## 2017-08-20 MED ORDER — ALBUTEROL SULFATE (2.5 MG/3ML) 0.083% IN NEBU
10.0000 mg | INHALATION_SOLUTION | Freq: Once | RESPIRATORY_TRACT | Status: AC
  Administered 2017-08-20: 10 mg via RESPIRATORY_TRACT

## 2017-08-20 MED ORDER — DOXYCYCLINE HYCLATE 100 MG PO TABS
100.0000 mg | ORAL_TABLET | Freq: Two times a day (BID) | ORAL | Status: DC
  Administered 2017-08-20 – 2017-08-23 (×7): 100 mg via ORAL
  Filled 2017-08-20 (×7): qty 1

## 2017-08-20 MED ORDER — SODIUM CHLORIDE 0.9 % IV SOLN: 250.0000 mL | INTRAVENOUS | Status: DC | PRN

## 2017-08-20 MED ORDER — ONDANSETRON HCL 4 MG/2ML IJ SOLN
INTRAMUSCULAR | Status: AC
  Administered 2017-08-20: 4 mg via INTRAVENOUS
  Filled 2017-08-20: qty 2

## 2017-08-20 MED ORDER — METHYLPREDNISOLONE SODIUM SUCC 125 MG IJ SOLR
60.0000 mg | Freq: Four times a day (QID) | INTRAMUSCULAR | Status: DC
  Administered 2017-08-20 – 2017-08-21 (×2): 60 mg via INTRAVENOUS
  Filled 2017-08-20 (×2): qty 2

## 2017-08-20 MED ORDER — HYDROCODONE-ACETAMINOPHEN 5-325 MG PO TABS
1.0000 | ORAL_TABLET | ORAL | Status: DC | PRN
  Administered 2017-08-20 – 2017-08-23 (×8): 2 via ORAL
  Filled 2017-08-20 (×8): qty 2

## 2017-08-20 MED ORDER — METHYLPREDNISOLONE SODIUM SUCC 125 MG IJ SOLR
INTRAMUSCULAR | Status: AC
  Administered 2017-08-20: 125 mg via INTRAVENOUS
  Filled 2017-08-20: qty 2

## 2017-08-20 MED ORDER — ENOXAPARIN SODIUM 40 MG/0.4ML ~~LOC~~ SOLN
40.0000 mg | SUBCUTANEOUS | Status: DC
  Administered 2017-08-20 – 2017-08-23 (×4): 40 mg via SUBCUTANEOUS
  Filled 2017-08-20 (×4): qty 0.4

## 2017-08-20 MED ORDER — LORAZEPAM 2 MG/ML IJ SOLN
1.0000 mg | Freq: Once | INTRAMUSCULAR | Status: AC
  Administered 2017-08-20: 1 mg via INTRAVENOUS

## 2017-08-20 MED ORDER — ONDANSETRON HCL 4 MG/2ML IJ SOLN
4.0000 mg | Freq: Four times a day (QID) | INTRAMUSCULAR | Status: DC | PRN
  Administered 2017-08-21 – 2017-08-22 (×2): 4 mg via INTRAVENOUS
  Filled 2017-08-20 (×2): qty 2

## 2017-08-20 MED ORDER — ACETAMINOPHEN 325 MG PO TABS
650.0000 mg | ORAL_TABLET | Freq: Four times a day (QID) | ORAL | Status: DC | PRN
Start: 2017-08-20 — End: 2017-08-24
  Administered 2017-08-21 – 2017-08-22 (×3): 650 mg via ORAL
  Filled 2017-08-20 (×3): qty 2

## 2017-08-20 MED ORDER — ONDANSETRON HCL 4 MG PO TABS
4.0000 mg | ORAL_TABLET | Freq: Four times a day (QID) | ORAL | Status: DC | PRN
  Administered 2017-08-20: 4 mg via ORAL
  Filled 2017-08-20: qty 1

## 2017-08-20 MED ORDER — BUDESONIDE 0.25 MG/2ML IN SUSP
0.2500 mg | Freq: Two times a day (BID) | RESPIRATORY_TRACT | Status: DC
  Administered 2017-08-20 – 2017-08-24 (×7): 0.25 mg via RESPIRATORY_TRACT
  Filled 2017-08-20 (×7): qty 2

## 2017-08-20 MED ORDER — SODIUM CHLORIDE 0.9% FLUSH: 3.0000 mL | INTRAVENOUS | Status: DC | PRN

## 2017-08-20 MED ORDER — IPRATROPIUM-ALBUTEROL 0.5-2.5 (3) MG/3ML IN SOLN
3.0000 mL | RESPIRATORY_TRACT | Status: DC
Start: 2017-08-20 — End: 2017-08-24
  Administered 2017-08-20 – 2017-08-24 (×19): 3 mL via RESPIRATORY_TRACT
  Filled 2017-08-20 (×21): qty 3

## 2017-08-20 MED ORDER — GUAIFENESIN ER 600 MG PO TB12
600.0000 mg | ORAL_TABLET | Freq: Two times a day (BID) | ORAL | Status: DC
  Administered 2017-08-20 – 2017-08-24 (×8): 600 mg via ORAL
  Filled 2017-08-20 (×10): qty 1

## 2017-08-20 MED ORDER — GUAIFENESIN-CODEINE 100-10 MG/5ML PO SOLN
10.0000 mL | ORAL | Status: DC | PRN
  Administered 2017-08-23 (×3): 10 mL via ORAL
  Filled 2017-08-20 (×3): qty 10

## 2017-08-20 MED ORDER — SODIUM CHLORIDE 0.9% FLUSH
3.0000 mL | Freq: Two times a day (BID) | INTRAVENOUS | Status: DC
  Administered 2017-08-20 – 2017-08-24 (×7): 3 mL via INTRAVENOUS

## 2017-08-20 MED ORDER — CARVEDILOL 6.25 MG PO TABS
3.1250 mg | ORAL_TABLET | Freq: Two times a day (BID) | ORAL | Status: DC
  Administered 2017-08-21 – 2017-08-24 (×7): 3.125 mg via ORAL
  Filled 2017-08-20 (×7): qty 1

## 2017-08-20 MED ORDER — METHYLPREDNISOLONE SODIUM SUCC 125 MG IJ SOLR
125.0000 mg | Freq: Once | INTRAMUSCULAR | Status: AC
  Administered 2017-08-20: 125 mg via INTRAVENOUS

## 2017-08-20 MED ORDER — ONDANSETRON HCL 4 MG/2ML IJ SOLN
4.0000 mg | Freq: Once | INTRAMUSCULAR | Status: AC
  Administered 2017-08-20: 4 mg via INTRAVENOUS

## 2017-08-20 MED ORDER — ALBUTEROL SULFATE (2.5 MG/3ML) 0.083% IN NEBU
INHALATION_SOLUTION | RESPIRATORY_TRACT | Status: AC
  Administered 2017-08-20: 10 mg via RESPIRATORY_TRACT
  Filled 2017-08-20: qty 12

## 2017-08-20 MED ORDER — LORAZEPAM 2 MG/ML IJ SOLN
INTRAMUSCULAR | Status: AC
  Administered 2017-08-20: 1 mg via INTRAVENOUS
  Filled 2017-08-20: qty 1

## 2017-08-20 NOTE — ED Triage Notes (Signed)
Pt presents to ED via [facility] with c/o SHOB. Pt has hx of COPD and is on chronic 2L via [place], has been sick x 3 days and bumped herself up to 3.5L on her home O2. EMS reports pt is 97% on 4L via [place], gave 2 albuterol tx en route to hospital. Pt is noted to be tripoding on arrival to hospital. Pt c/o feeling anxious and feeling like she can't catch her breath.

## 2017-08-20 NOTE — ED Provider Notes (Addendum)
[facility] Emergency Department Provider Note  ____________________________________________   I have reviewed the triage vital signs and the nursing notes. Where available I have reviewed prior notes and, if possible and indicated, outside hospital notes.    HISTORY  Chief Complaint No chief complaint on file.    HPI Michelle [name] is a 57 y.o. female with a history of CHF last echo showed 60-65% EF, cocaine abuse though she denies recent cocaine abuse, tobacco abuse, presents today complaining of shortness of breath and dull nonproductive cough.  He denies any leg swelling, history of PE or DVT, she has no chest pain, she does feel anxious.  Nothing makes symptoms better, coughing makes them worse.  Denies fever.  Limited history because of respiratory issues, level 5 chart caveat; no further history available due to patient status.  Patient did get 2 albuterol treatments but no Solu-Medrol from EMS and feels slightly better  Past Medical History:  Diagnosis Date  . Allergy   . Anxiety   . Asthma   . CHF (congestive heart failure) ([place])   . Cocaine abuse ([place])   . COPD (chronic obstructive pulmonary disease) ([place])   . Coronary artery disease   . Emphysema/COPD ([place])   . Hepatitis C   . High cholesterol   . Hypertension   . Pneumothorax   . Tobacco abuse     Patient Active Problem List   Diagnosis Date Noted  . Acute on chronic respiratory failure with hypoxia and hypercapnia ([place]) 06/29/2017  . Acute on chronic respiratory failure with hypoxia ([place]) 02/23/2017  . Protein-calorie malnutrition, severe 05/23/2016  . Sepsis ([place]) 05/22/2016  . Malnutrition of moderate degree ([place]) 10/25/2014  . COPD exacerbation ([place]) 10/24/2014  . Tobacco abuse 10/24/2014    Past Surgical History:  Procedure Laterality Date  . CARDIAC CATHETERIZATION    . CHEST TUBE INSERTION      Prior to Admission medications   Medication Sig Start Date End Date Taking?  Authorizing Provider  albuterol (PROVENTIL HFA;VENTOLIN HFA) 108 (90 Base) MCG/ACT inhaler Inhale 2 puffs into the lungs every 6 (six) hours as needed for wheezing or shortness of breath. 02/26/17   Dustin Flock, MD  ALPRAZolam Duanne Moron) 0.5 MG tablet Take 1 tablet (0.5 mg total) by mouth every 8 (eight) hours. 07/02/17   Demetrios Loll, MD  aspirin EC 81 MG tablet Take 81 mg by mouth daily.    [provider]  carvedilol (COREG) 3.125 MG tablet Take 1 tablet (3.125 mg total) by mouth every morning. Patient taking differently: Take 3.125 mg by mouth 2 (two) times daily with a meal.  02/10/16   Gladstone Lighter, MD  enalapril (VASOTEC) 10 MG tablet Take 0.5 tablets (5 mg total) by mouth daily. 02/10/16   Gladstone Lighter, MD  guaiFENesin-codeine 100-10 MG/5ML syrup Take 10 mLs by mouth every 4 (four) hours as needed for cough. 07/02/17   Demetrios Loll, MD  ipratropium-albuterol (DUONEB) 0.5-2.5 (3) MG/3ML SOLN Take 3 mLs by nebulization every 6 (six) hours as needed. 01/04/17   [name], MD  polyethylene glycol (GOLYTELY) 236 g solution Drink one 8 oz glass every 20 mins until stools are clear starting at 5:00pm on 08/14/17 07/26/17   Lucilla Lame, MD  predniSONE (DELTASONE) 10 MG tablet 40 mg po daily 1 day, 30 mg po daily 1 day, 20 mg po daily 1 day, 10 mg po daily 1 day. 07/02/17   Demetrios Loll, MD  [facility] 160-4.5 MCG/ACT inhaler Take 2  puffs by mouth 2 (two) times daily.    [provider]  tiotropium (SPIRIVA) 18 MCG inhalation capsule Place 1 capsule (18 mcg total) into inhaler and inhale daily. 01/04/17   [name], MD    Allergies Patient has no active allergies.  Family History  Problem Relation Age of Onset  . Lung cancer Mother   . Asthma Mother   . CVA Father   . CAD Father   . Stroke Father   . Breast cancer Maternal Aunt 67  . COPD Sister     Social History Social History   Tobacco Use  . Smoking status: Current Every Day Smoker     Packs/day: 0.10    Years: 41.00    Pack years: 4.10    Types: Cigarettes  . Smokeless tobacco: Never Used  Substance Use Topics  . Alcohol use: No    Frequency: Never  . Drug use: Yes    Types: "Crack" cocaine    Comment: Past cocaine use.  Quit 04/2017    Review of Systems Constitutional: No fever/chills Eyes: No visual changes. ENT: No sore throat. No stiff neck no neck pain Cardiovascular: Denies chest pain. Respiratory: + shortness of breath. Gastrointestinal:   no vomiting.  No diarrhea.  No constipation. Genitourinary: Negative for dysuria. Musculoskeletal: Negative lower extremity swelling Skin: Negative for rash. Neurological: Negative for severe headaches, focal weakness or numbness.   ____________________________________________   PHYSICAL EXAM:  VITAL SIGNS: ED Triage Vitals  Enc Vitals Group     BP      Pulse      Resp      Temp      Temp src      SpO2      Weight      Height      Head Circumference      Peak Flow      Pain Score      Pain Loc      Pain Edu?      Excl. in [place]?     Constitutional: Alert and oriented.  Vision acutely ill-appearing, tripoding, speaking 4 word sentences to squeeze Eyes: Conjunctivae are normal Head: Atraumatic HEENT: No congestion/rhinnorhea. Mucous membranes are moist.  Oropharynx non-erythematous Neck:   Nontender with no meningismus, no masses, no stridor Cardiovascular: Normal rate, regular rhythm. Grossly normal heart sounds.  Good peripheral circulation. Respiratory: Normal respiratory effort.  No retractions.  Use wheeze Abdominal: Soft and nontender. No distention. No guarding no rebound Back:  There is no focal tenderness or step off.  there is no midline tenderness there are no lesions noted. there is no CVA tenderness  Musculoskeletal: No lower extremity tenderness, no upper extremity tenderness. No joint effusions, no DVT signs strong distal pulses no edema Neurologic:  Normal speech and language. No  gross focal neurologic deficits are appreciated.  Skin:  Skin is warm, dry and intact. No rash noted. Psychiatric: Mood and affect are normal. Speech and behavior are normal.  ____________________________________________   LABS (all labs ordered are listed, but only abnormal results are displayed)  Labs Reviewed  CULTURE, BLOOD (ROUTINE X 2)  CULTURE, BLOOD (ROUTINE X 2)  CBC WITH DIFFERENTIAL/PLATELET  BRAIN NATRIURETIC PEPTIDE  COMPREHENSIVE METABOLIC PANEL  TROPONIN I  BLOOD GAS, VENOUS    Pertinent labs  results that were available during my care of the patient were reviewed by me and considered in my medical decision making (see chart for details). ____________________________________________  EKG  I personally interpreted any  EKGs ordered by me or triage Sinus rhythm rate 90 bpm, no acute ST elevation or depression, some baseline artifact limits interpretation's, LAFB and RBB noted. ____________________________________________  RADIOLOGY  Pertinent labs & imaging results that were available during my care of the patient were reviewed by me and considered in my medical decision making (see chart for details). If possible, patient and/or family made aware of any abnormal findings.  No results found. ____________________________________________    PROCEDURES  Procedure(s) performed: None  Procedures  Critical Care performed: CRITICAL CARE Performed by: Schuyler Amor   Total critical care time: 45 minutes  Critical care time was exclusive of separately billable procedures and treating other patients.  Critical care was necessary to treat or prevent imminent or life-threatening deterioration.  Critical care was time spent personally by me on the following activities: development of treatment plan with patient and/or surrogate as well as nursing, discussions with consultants, evaluation of patient's response to treatment, examination of patient, obtaining history  from patient or surrogate, ordering and performing treatments and interventions, ordering and review of laboratory studies, ordering and review of radiographic studies, pulse oximetry and re-evaluation of patient's condition.   ____________________________________________   INITIAL IMPRESSION / ASSESSMENT AND PLAN / ED COURSE  Pertinent labs & imaging results that were available during my care of the patient were reviewed by me and considered in my medical decision making (see chart for details).  Patient here with shortness of breath, she is in some degree of respiratory distress.  We are initiating BiPAP.  Lungs did not betray evidence of pneumonia to auscultation nor is there evidence or stigmata of CHF on exam.  Both of things are in the differential.  Most likely this is COPD.  Patient's chest x-ray is pending.    ____________________________________________   FINAL CLINICAL IMPRESSION(S) / ED DIAGNOSES  Final diagnoses:  SOB (shortness of breath)      This chart was dictated using voice recognition software.  Despite best efforts to proofread,  errors can occur which can change meaning.      Schuyler Amor, MD 08/20/17 1435    Schuyler Amor, MD 08/20/17 1441    Schuyler Amor, MD 08/20/17 1446

## 2017-08-20 NOTE — ED Notes (Signed)
Respiratory at bedside to wean patient off of bipap. Patient placed on 4L [place]. Oxygen saturation remains at 95%. Will continue to monitor.

## 2017-08-20 NOTE — H&P (Signed)
[facility] [place] NAME: Michelle [name]    MR#:  [id number]  DATE OF BIRTH:  [birth year]  DATE OF ADMISSION:  08/20/2017  PRIMARY CARE PHYSICIAN: [facility] [place]   REQUESTING/REFERRING PHYSICIAN: Schuyler Amor, MD  CHIEF COMPLAINT:   Chief Complaint  Patient presents with  . Shortness of Breath    HISTORY OF PRESENT ILLNESS: Michelle [name]  is a 57 y.o. female with a known history of anxiety, congestive heart failure, COPD, coronary artery disease, hyperlipidemia and essential hypertension and continued tobacco abuse who is presenting to the emergency room with complaint of cough congestion ongoing for the past 3 days.  Patient states that she has had progressive shortness of breath with wheezing and coughing.  When patient initially arrived she was tachypneic requiring BiPAP.  She currently continues to be on BiPAP but breathing is improved.  She denied denies any fevers or chills complains of abdominal pain no nausea vomiting or diarrhea.  PAST MEDICAL HISTORY:   Past Medical History:  Diagnosis Date  . Allergy   . Anxiety   . Asthma   . CHF (congestive heart failure) ([place])   . Cocaine abuse ([place])   . COPD (chronic obstructive pulmonary disease) ([place])   . Coronary artery disease   . Emphysema/COPD ([place])   . Hepatitis C   . High cholesterol   . Hypertension   . Pneumothorax   . Tobacco abuse     PAST SURGICAL HISTORY:  Past Surgical History:  Procedure Laterality Date  . CARDIAC CATHETERIZATION    . CHEST TUBE INSERTION      SOCIAL HISTORY:  Social History   Tobacco Use  . Smoking status: Current Every Day Smoker    Packs/day: 0.10    Years: 41.00    Pack years: 4.10    Types: Cigarettes  . Smokeless tobacco: Never Used  Substance Use Topics  . Alcohol use: No    Frequency: Never    FAMILY HISTORY:  Family History  Problem Relation Age of Onset  . Lung cancer Mother   . Asthma Mother   . CVA Father    . CAD Father   . Stroke Father   . Breast cancer Maternal Aunt 67  . COPD Sister     DRUG ALLERGIES: No Known Allergies  REVIEW OF SYSTEMS:   CONSTITUTIONAL: No fever, fatigue or weakness.  EYES: No blurred or double vision.  EARS, NOSE, AND THROAT: No tinnitus or ear pain.  RESPIRATORY: Positive cough, positive shortness of breath, positive for wheezing or hemoptysis.  CARDIOVASCULAR: No chest pain, orthopnea, edema.  GASTROINTESTINAL: No nausea, vomiting, diarrhea or abdominal pain.  GENITOURINARY: No dysuria, hematuria.  ENDOCRINE: No polyuria, nocturia,  HEMATOLOGY: No anemia, easy bruising or bleeding SKIN: No rash or lesion. MUSCULOSKELETAL: No joint pain or arthritis.   NEUROLOGIC: No tingling, numbness, weakness.  PSYCHIATRY: No anxiety or depression.   MEDICATIONS AT HOME:  Prior to Admission medications   Medication Sig Start Date End Date Taking? Authorizing Provider  aspirin EC 81 MG tablet Take 81 mg by mouth daily.   Yes [provider]  carvedilol (COREG) 3.125 MG tablet Take 1 tablet (3.125 mg total) by mouth every morning. Patient taking differently: Take 3.125 mg by mouth 2 (two) times daily with a meal.  02/10/16  Yes Gladstone Lighter, MD  enalapril (VASOTEC) 10 MG tablet Take 0.5 tablets (5 mg total) by mouth daily. 02/10/16  Yes Gladstone Lighter, MD  ipratropium-albuterol (DUONEB) 0.5-2.5 (3) MG/3ML SOLN Take 3 mLs by nebulization every 6 (six) hours as needed. 01/04/17  Yes [name], MD  predniSONE (DELTASONE) 10 MG tablet 40 mg po daily 1 day, 30 mg po daily 1 day, 20 mg po daily 1 day, 10 mg po daily 1 day. Patient taking differently: Take 10 mg by mouth daily with breakfast.  07/02/17  Yes Demetrios Loll, MD  [facility] 160-4.5 MCG/ACT inhaler Take 2 puffs by mouth 2 (two) times daily.   Yes [provider]  tiotropium (SPIRIVA) 18 MCG inhalation capsule Place 1 capsule (18 mcg total) into inhaler and inhale daily. 01/04/17  Yes  [name], MD  albuterol (PROVENTIL HFA;VENTOLIN HFA) 108 (90 Base) MCG/ACT inhaler Inhale 2 puffs into the lungs every 6 (six) hours as needed for wheezing or shortness of breath. 02/26/17   Dustin Flock, MD  ALPRAZolam Duanne Moron) 0.5 MG tablet Take 1 tablet (0.5 mg total) by mouth every 8 (eight) hours. Patient not taking: Reported on 08/20/2017 07/02/17   Demetrios Loll, MD  guaiFENesin-codeine 100-10 MG/5ML syrup Take 10 mLs by mouth every 4 (four) hours as needed for cough. Patient not taking: Reported on 08/20/2017 07/02/17   Demetrios Loll, MD  polyethylene glycol (GOLYTELY) 236 g solution Drink one 8 oz glass every 20 mins until stools are clear starting at 5:00pm on 08/14/17 07/26/17   Lucilla Lame, MD      PHYSICAL EXAMINATION:   VITAL SIGNS: Blood pressure 132/89, pulse 92, temperature 97.6 F (36.4 C), temperature source Oral, resp. rate (!) 24, height 5' (1.524 m), weight 110 lb (49.9 kg), last menstrual period 03/06/2005, SpO2 97 %.  GENERAL:  57 y.o.-year-old patient lying in the bed with mild respiratory distress EYES: Pupils equal, round, reactive to light and accommodation. No scleral icterus. Extraocular muscles intact.  HEENT: Head atraumatic, normocephalic. Oropharynx and nasopharynx clear.  NECK:  Supple, no jugular venous distention. No thyroid enlargement, no tenderness.  LUNGS: Positive accessory muscle usage, bilateral wheezing throughout both lungs CARDIOVASCULAR: S1, S2 normal. No murmurs, rubs, or gallops.  ABDOMEN: Soft, nontender, nondistended. Bowel sounds present. No organomegaly or mass.  EXTREMITIES: No pedal edema, cyanosis, or clubbing.  NEUROLOGIC: Cranial nerves II through XII are intact. Muscle strength 5/5 in all extremities. Sensation intact. Gait not checked.  PSYCHIATRIC: The patient is alert and oriented x 3.  SKIN: No obvious rash, lesion, or ulcer.   LABORATORY PANEL:   CBC Recent Labs  Lab 08/20/17 1337  WBC 10.9  HGB 13.9  HCT 42.4   PLT 216  MCV 88.2  MCH 29.0  MCHC 32.8  RDW 14.0  LYMPHSABS 1.5  MONOABS 0.8  EOSABS 0.1  BASOSABS 0.0   ------------------------------------------------------------------------------------------------------------------  Chemistries  Recent Labs  Lab 08/20/17 1337  NA 140  K 3.8  CL 105  CO2 28  GLUCOSE 93  BUN 26*  CREATININE 0.75  CALCIUM 9.0  AST 18  ALT 13*  ALKPHOS 40  BILITOT 0.5   ------------------------------------------------------------------------------------------------------------------ estimated creatinine clearance is 56.4 mL/min (by C-G formula based on SCr of 0.75 mg/dL). ------------------------------------------------------------------------------------------------------------------ No results for input(s): TSH, T4TOTAL, T3FREE, THYROIDAB in the last 72 hours.  Invalid input(s): FREET3   Coagulation profile No results for input(s): INR, PROTIME in the last 168 hours. ------------------------------------------------------------------------------------------------------------------- No results for input(s): DDIMER in the last 72 hours. -------------------------------------------------------------------------------------------------------------------  Cardiac Enzymes Recent Labs  Lab 08/20/17 1337  TROPONINI <0.03   ------------------------------------------------------------------------------------------------------------------ Invalid input(s): POCBNP  ---------------------------------------------------------------------------------------------------------------  Urinalysis    Component  Value Date/Time   COLORURINE STRAW (A) 02/07/2016 1715   APPEARANCEUR CLEAR (A) 02/07/2016 1715   APPEARANCEUR Clear 10/30/2013 1230   LABSPEC 1.011 02/07/2016 1715   LABSPEC 1.005 10/30/2013 1230   PHURINE 7.0 02/07/2016 1715   GLUCOSEU NEGATIVE 02/07/2016 1715   GLUCOSEU Negative 10/30/2013 1230   HGBUR NEGATIVE 02/07/2016 1715   BILIRUBINUR  NEGATIVE 02/07/2016 1715   BILIRUBINUR Negative 10/30/2013 1230   KETONESUR NEGATIVE 02/07/2016 [place] 02/07/2016 1715   NITRITE NEGATIVE 02/07/2016 1715   LEUKOCYTESUR 2+ (A) 02/07/2016 1715   LEUKOCYTESUR Negative 10/30/2013 1230     RADIOLOGY: Dg Chest Port 1 View  Result Date: 08/20/2017 CLINICAL DATA:  Shortness of breath EXAM: PORTABLE CHEST 1 VIEW COMPARISON:  June 29, 2017 FINDINGS: The heart size and mediastinal contours are within normal limits. The lungs are hyperinflated. There are small bilateral pleural effusions. There is no focal infiltrate or pulmonary edema. The visualized skeletal structures are unremarkable. IMPRESSION: Small bilateral pleural effusions.  No focal pneumonia. COPD. Electronically Signed   By: Abelardo Diesel M.D.   On: 08/20/2017 14:16    EKG: Orders placed or performed in visit on 08/20/17  . EKG 12-Lead    IMPRESSION AND PLAN: Patient is a 57 year old African-American female presenting to the emergency room with acute respiratory failure  1.  Acute respiratory failure This is related to acute on chronic COPD exasperation We will treat with Solu-Medrol, nebulizer, Pulmicort We will also add antitussive medications  2.  Coronary artery disease continue Coreg and aspirin  3.  Generalized anxiety disorder continue alprazolam  4.  History of hyperlipidemia currently not on any treatment check lipid panel in the morning  5.  Nicotine abuse Smoking cessation provided 36min spent strongly recommend patient stop smoking she will be started on nicotine patch   All the records are reviewed and case discussed with ED provider. Management plans discussed with the patient, family and they are in agreement.  CODE STATUS: Code Status History    Date Active Date Inactive Code Status Order ID Comments User Context   06/29/2017 06:25 07/02/2017 20:08 Full Code [id number]  Harrie Foreman, MD Inpatient   02/23/2017 02:41 02/26/2017  16:34 Full Code [id number]  Harrie Foreman, MD Inpatient   01/01/2017 11:44 01/04/2017 13:54 Full Code [id number]  Bettey Costa, MD Inpatient   05/22/2016 21:55 05/27/2016 16:45 Full Code [id number]  Fritzi Mandes, MD Inpatient   04/23/2016 20:20 04/27/2016 18:48 Full Code [id number]  Lytle Butte, MD ED   02/07/2016 19:22 02/10/2016 18:35 Full Code [id number]  Gladstone Lighter, MD Inpatient   11/29/2015 05:19 12/01/2015 16:09 Full Code [id number]  Harrie Foreman, MD Inpatient   11/05/2015 09:11 11/07/2015 16:06 Full Code [id number]  Hillary Bow, MD ED   10/24/2014 15:22 10/27/2014 16:30 Full Code [id number]  Hillary Bow, MD Inpatient       TOTAL TIME TAKING CARE OF THIS PATIENT: 42minutes.    Dustin Flock M.D on 08/20/2017 at 2:45 PM  Between 7am to 6pm - Pager - [phone]  After 6pm go to [email or web address] - Proofreader  [employer] Office  [phone]  CC: Primary care physician; [facility], [facility]

## 2017-08-21 LAB — BASIC METABOLIC PANEL
ANION GAP: 8 (ref 5–15)
BUN: 27 mg/dL — ABNORMAL HIGH (ref 6–20)
CO2: 27 mmol/L (ref 22–32)
Calcium: 8.9 mg/dL (ref 8.9–10.3)
Chloride: 103 mmol/L (ref 101–111)
Creatinine, Ser: 0.62 mg/dL (ref 0.44–1.00)
GFR calc Af Amer: >60 mL/min (ref >60)
GLUCOSE: 156 mg/dL — AB (ref 65–99)
POTASSIUM: 4.1 mmol/L (ref 3.5–5.1)
Sodium: 138 mmol/L (ref 135–145)

## 2017-08-21 LAB — LIPID PANEL
Cholesterol: 233 mg/dL — ABNORMAL HIGH (ref 0–200)
HDL: 71 mg/dL (ref >40)
LDL CALC: 151 mg/dL — AB (ref 0–99)
Total CHOL/HDL Ratio: 3.3 RATIO
Triglycerides: 53 mg/dL (ref <150)
VLDL: 11 mg/dL (ref 0–40)

## 2017-08-21 LAB — CBC
HCT: 33.7 % — ABNORMAL LOW (ref 35.0–47.0)
Hemoglobin: 11.2 g/dL — ABNORMAL LOW (ref 12.0–16.0)
MCH: 29.3 pg (ref 26.0–34.0)
MCHC: 33.3 g/dL (ref 32.0–36.0)
MCV: 87.8 fL (ref 80.0–100.0)
Platelets: 213 10*3/uL (ref 150–440)
RBC: 3.84 MIL/uL (ref 3.80–5.20)
RDW: 13.7 % (ref 11.5–14.5)
WBC: 11.4 10*3/uL — AB (ref 3.6–11.0)

## 2017-08-21 MED ORDER — MOMETASONE FURO-FORMOTEROL FUM 100-5 MCG/ACT IN AERO
2.0000 | INHALATION_SPRAY | Freq: Two times a day (BID) | RESPIRATORY_TRACT | Status: DC
  Administered 2017-08-21 – 2017-08-23 (×5): 2 via RESPIRATORY_TRACT
  Filled 2017-08-21: qty 8.8

## 2017-08-21 MED ORDER — BISACODYL 5 MG PO TBEC
5.0000 mg | DELAYED_RELEASE_TABLET | Freq: Every day | ORAL | Status: DC | PRN
  Administered 2017-08-21 – 2017-08-22 (×2): 5 mg via ORAL
  Filled 2017-08-21 (×2): qty 1

## 2017-08-21 MED ORDER — ALPRAZOLAM 0.5 MG PO TABS
0.5000 mg | ORAL_TABLET | Freq: Three times a day (TID) | ORAL | Status: DC | PRN
  Administered 2017-08-21 – 2017-08-24 (×5): 0.5 mg via ORAL
  Filled 2017-08-21 (×5): qty 1

## 2017-08-21 MED ORDER — METHYLPREDNISOLONE SODIUM SUCC 40 MG IJ SOLR
40.0000 mg | Freq: Three times a day (TID) | INTRAMUSCULAR | Status: DC
  Administered 2017-08-21 – 2017-08-22 (×3): 40 mg via INTRAVENOUS
  Filled 2017-08-21 (×3): qty 1

## 2017-08-21 MED ORDER — ENSURE ENLIVE PO LIQD
237.0000 mL | Freq: Two times a day (BID) | ORAL | Status: DC
  Administered 2017-08-21 – 2017-08-24 (×7): 237 mL via ORAL

## 2017-08-21 MED ORDER — ALUM & MAG HYDROXIDE-SIMETH 200-200-20 MG/5ML PO SUSP
30.0000 mL | ORAL | Status: DC | PRN
  Administered 2017-08-21 – 2017-08-22 (×3): 30 mL via ORAL
  Filled 2017-08-21 (×3): qty 30

## 2017-08-21 MED ORDER — ADULT MULTIVITAMIN W/MINERALS CH
1.0000 | ORAL_TABLET | Freq: Every day | ORAL | Status: DC
  Administered 2017-08-21 – 2017-08-23 (×3): 1 via ORAL
  Filled 2017-08-21 (×4): qty 1

## 2017-08-21 MED ORDER — LISINOPRIL 2.5 MG PO TABS
2.5000 mg | ORAL_TABLET | Freq: Every day | ORAL | Status: DC
  Administered 2017-08-21 – 2017-08-24 (×4): 2.5 mg via ORAL
  Filled 2017-08-21 (×4): qty 1

## 2017-08-21 NOTE — Progress Notes (Signed)
[place] at [place] NAME: Michelle [name]    MR#:  [id number]  DATE OF BIRTH:  May 22, 1961  SUBJECTIVE:   Patient still with shortness of breath and wheezing.  REVIEW OF SYSTEMS:    Review of Systems  Constitutional: Negative for fever, chills weight loss HENT: Negative for ear pain, nosebleeds, congestion, facial swelling, rhinorrhea, neck pain, neck stiffness and ear discharge.   Respiratory: Positive for cough, shortness of breath, wheezing  Cardiovascular: Negative for chest pain, palpitations and leg swelling.  Gastrointestinal: Negative for heartburn, abdominal pain, vomiting, diarrhea or consitpation Genitourinary: Negative for dysuria, urgency, frequency, hematuria Musculoskeletal: Negative for back pain or joint pain Neurological: Negative for dizziness, seizures, syncope, focal weakness,  numbness and headaches.  Hematological: Does not bruise/bleed easily.  Psychiatric/Behavioral: Negative for hallucinations, confusion, dysphoric mood    Tolerating Diet: yes      DRUG ALLERGIES:  No Known Allergies  VITALS:  Blood pressure 135/82, pulse 74, temperature 99.4 F (37.4 C), temperature source Oral, resp. rate 17, height 5' (1.524 m), weight 49.9 kg (110 lb), last menstrual period 03/06/2005, SpO2 95 %.  PHYSICAL EXAMINATION:  Constitutional: Appears well-developed and well-nourished. No distress. HENT: Normocephalic. Marland Kitchen Oropharynx is clear and moist.  Eyes: Conjunctivae and EOM are normal. PERRLA, no scleral icterus.  Neck: Normal ROM. Neck supple. No JVD. No tracheal deviation. CVS: RRR, S1/S2 +, no murmurs, no gallops, no carotid bruit.  Pulmonary: Normal respiratory effort with minimal bilateral wheezing.  She sounds very tight  abdominal: Soft. BS +,  no distension, tenderness, rebound or guarding.  Musculoskeletal: Normal range of motion. No edema and no tenderness.  Neuro: Alert. CN 2-12 grossly intact. No focal  deficits. Skin: Skin is warm and dry. No rash noted. Psychiatric: Normal mood and affect.      LABORATORY PANEL:   CBC Recent Labs  Lab 08/21/17 0706  WBC 11.4*  HGB 11.2*  HCT 33.7*  PLT 213   ------------------------------------------------------------------------------------------------------------------  Chemistries  Recent Labs  Lab 08/20/17 1337 08/21/17 0706  NA 140 138  K 3.8 4.1  CL 105 103  CO2 28 27  GLUCOSE 93 156*  BUN 26* 27*  CREATININE 0.75 0.62  CALCIUM 9.0 8.9  AST 18  --   ALT 13*  --   ALKPHOS 40  --   BILITOT 0.5  --    ------------------------------------------------------------------------------------------------------------------  Cardiac Enzymes Recent Labs  Lab 08/20/17 1337  TROPONINI <0.03   ------------------------------------------------------------------------------------------------------------------  RADIOLOGY:  Dg Chest Port 1 View  Result Date: 08/20/2017 CLINICAL DATA:  Shortness of breath EXAM: PORTABLE CHEST 1 VIEW COMPARISON:  June 29, 2017 FINDINGS: The heart size and mediastinal contours are within normal limits. The lungs are hyperinflated. There are small bilateral pleural effusions. There is no focal infiltrate or pulmonary edema. The visualized skeletal structures are unremarkable. IMPRESSION: Small bilateral pleural effusions.  No focal pneumonia. COPD. Electronically Signed   By: Abelardo Diesel M.D.   On: 08/20/2017 14:16     ASSESSMENT AND PLAN:    57 year old female with history of tobacco dependence and COPD with chronic hypoxic respiratory failure on 2 L of oxygen who presents with shortness of breath.  1.  Acute on chronic hypoxic respiratory failure in the setting of COPD exacerbation Patient weaned off of BiPAP and back on nasal cannula  2.  Acute COPD exacerbation: Continue IV steroids, nebs and inhalers  3. Tobacco dependence: Patient is encouraged to quit smoking. Counseling was provided for  4  minutes.  4.  Chronic diastolic heart failure with preserved ejection fraction:  Continue ACE inhibitor and Coreg  5.  Essential hypertension: Continue Coreg and ACE inhibitor  6.  Hyperlipidemia: Patient will need outpatient follow-up regarding this.  Management plans discussed with the patient and she is in agreement.  CODE STATUS: FULL  TOTAL TIME TAKING CARE OF THIS PATIENT: 30 minutes.     POSSIBLE D/C 2 days, DEPENDING ON CLINICAL CONDITION.   Maryanne Huneycutt M.D on 08/21/2017 at 12:32 PM  Between 7am to 6pm - Pager - [phone] After 6pm go to [email or web address] - password [email or web address] [facility]  Office  [phone]  CC: Primary care physician; [facility], [facility]  Note: This dictation was prepared with Dragon dictation along with smaller phrase technology. Any transcriptional errors that result from this process are unintentional.

## 2017-08-21 NOTE — Progress Notes (Signed)
Initial Nutrition Assessment  DOCUMENTATION CODES:   Non-severe (moderate) malnutrition in context of chronic illness  INTERVENTION:   Ensure Enlive po BID, each supplement provides 350 kcal and 20 grams of protein  MVI daily  Magic cup TID with meals, each supplement provides 290 kcal and 9 grams of protein  Snacks  Bowel regimen per MD  NUTRITION DIAGNOSIS:   Moderate Malnutrition related to chronic illness(COPD, CHF, drug abuse ) as evidenced by moderate fat depletion, moderate muscle depletion.  GOAL:   Patient will meet greater than or equal to 90% of their needs  MONITOR:   Supplement acceptance, PO intake, Weight trends, Skin, Labs  REASON FOR ASSESSMENT:   Malnutrition Screening Tool    ASSESSMENT:   57 y.o. female with a known history of anxiety, congestive heart failure, COPD, coronary artery disease, hyperlipidemia and essential hypertension, drug abuse and continued tobacco abuse who is presenting to the emergency room with complaint of cough congestion ongoing for the past 3 days.    Met with pt in room today. Pt reports good appetite and oral intake pta. RD familiar with this pt from multiple previous admits. Pt with h/o severe malnutrition; pt weighed ~80lbs last year and has been working hard to gain back her weight. Pt has gained back ~30lbs. Pt reports that she has really been focusing on eating more nutrient dense foods. Pt drinks two Ensure per day and eats yogurt and peanut butter for snacks. Pt with multiple documented heights in the past; pt reports that she is unsure how tall she is but she believes she is around 5 feet tall or a little less. RD will order supplements and snacks. Pt with type 1 BM yesterday; recommend bowel regimen per MD. Pt currently eating 100% of meals in hospital.    Medications reviewed and include: doxycycline, lovenox, solu-medrol, nicotine, maalox   Labs reviewed: BUN 27(H) WBC-11.4(H)  NUTRITION - FOCUSED PHYSICAL  EXAM:    Most Recent Value  Orbital Region  No depletion  Upper Arm Region  Moderate depletion  Thoracic and Lumbar Region  Mild depletion  Buccal Region  No depletion  Temple Region  No depletion  Clavicle Bone Region  Mild depletion  Clavicle and Acromion Bone Region  Mild depletion  Scapular Bone Region  Mild depletion  Dorsal Hand  Mild depletion  Patellar Region  Moderate depletion  Anterior Thigh Region  Moderate depletion  Posterior Calf Region  Moderate depletion  Edema (RD Assessment)  None  Hair  Reviewed  Eyes  Reviewed  Mouth  Reviewed  Skin  Reviewed  Nails  Reviewed      Diet Order:  Diet regular Room service appropriate? Yes; Fluid consistency: Thin  EDUCATION NEEDS:   Education needs have been addressed  Skin:  Reviewed RN Assessment  Last BM:  3/19- type 1  Height:   Ht Readings from Last 1 Encounters:  08/20/17 5' (1.524 m)    Weight:   Wt Readings from Last 1 Encounters:  08/20/17 110 lb (49.9 kg)    Ideal Body Weight:  45.4 kg  BMI:  Body mass index is 21.48 kg/m.  Estimated Nutritional Needs:   Kcal:  1300-1500kcal/day   Protein:  68-77g/day   Fluid:  >1.3L/day or per MD  Koleen Distance MS, RD, LDN Pager #[phone] After Hours Pager: [phone]

## 2017-08-21 NOTE — Progress Notes (Signed)
Per MD okay for RN to place order for dulcolax.

## 2017-08-22 ENCOUNTER — Encounter: Admission: RE | Payer: Self-pay | Source: Ambulatory Visit

## 2017-08-22 ENCOUNTER — Ambulatory Visit: Admission: RE | Admit: 2017-08-22 | Payer: Medicaid Other | Source: Ambulatory Visit | Admitting: Gastroenterology

## 2017-08-22 SURGERY — COLONOSCOPY WITH PROPOFOL
Anesthesia: General

## 2017-08-22 MED ORDER — METHYLPREDNISOLONE SODIUM SUCC 40 MG IJ SOLR
40.0000 mg | Freq: Two times a day (BID) | INTRAMUSCULAR | Status: DC
  Administered 2017-08-22 – 2017-08-24 (×4): 40 mg via INTRAVENOUS
  Filled 2017-08-22 (×4): qty 1

## 2017-08-22 NOTE — Progress Notes (Signed)
[place] at [place] NAME: Michelle [name]    MR#:  [id number]  DATE OF BIRTH:  16-Mar-1961  SUBJECTIVE: Admitted for COPD exacerbation, she feels short of breath but better than yesterday.   Marland Kitchen  REVIEW OF SYSTEMS:    Review of Systems  Constitutional: Negative for fever, chills weight loss HENT: Negative for ear pain, nosebleeds, congestion, facial swelling, rhinorrhea, neck pain, neck stiffness and ear discharge.   Respiratory: Positive for cough, shortness of breath,   Cardiovascular: Negative for chest pain, palpitations and leg swelling.  Gastrointestinal: Negative for heartburn, abdominal pain, vomiting, diarrhea or consitpation Genitourinary: Negative for dysuria, urgency, frequency, hematuria Musculoskeletal: Negative for back pain or joint pain Neurological: Negative for dizziness, seizures, syncope, focal weakness,  numbness and headaches.  Hematological: Does not bruise/bleed easily.  Psychiatric/Behavioral: Negative for hallucinations, confusion, dysphoric mood    Tolerating Diet: yes      DRUG ALLERGIES:  No Known Allergies  VITALS:  Blood pressure 130/80, pulse 75, temperature 97.7 F (36.5 C), temperature source Oral, resp. rate 20, height 5' (1.524 m), weight 49.9 kg (110 lb), last menstrual period 03/06/2005, SpO2 99 %.  PHYSICAL EXAMINATION:  Constitutional: Appears well-developed and well-nourished. No distress. HENT: Normocephalic. Marland Kitchen Oropharynx is clear and moist.  Eyes: Conjunctivae and EOM are normal. PERRLA, no scleral icterus.  Neck: Normal ROM. Neck supple. No JVD. No tracheal deviation. CVS: RRR, S1/S2 +, no murmurs, no gallops, no carotid bruit.  Pulmonary: Normal respiratory effort with minimal bilateral wheezing.  abdominal: Soft. BS +,  no distension, tenderness, rebound or guarding.  Musculoskeletal: Normal range of motion. No edema and no tenderness.  Neuro: Alert. CN 2-12 grossly intact. No focal  deficits. Skin: Skin is warm and dry. No rash noted. Psychiatric: Normal mood and affect.      LABORATORY PANEL:   CBC Recent Labs  Lab 08/21/17 0706  WBC 11.4*  HGB 11.2*  HCT 33.7*  PLT 213   ------------------------------------------------------------------------------------------------------------------  Chemistries  Recent Labs  Lab 08/20/17 1337 08/21/17 0706  NA 140 138  K 3.8 4.1  CL 105 103  CO2 28 27  GLUCOSE 93 156*  BUN 26* 27*  CREATININE 0.75 0.62  CALCIUM 9.0 8.9  AST 18  --   ALT 13*  --   ALKPHOS 40  --   BILITOT 0.5  --    ------------------------------------------------------------------------------------------------------------------  Cardiac Enzymes Recent Labs  Lab 08/20/17 1337  TROPONINI <0.03   ------------------------------------------------------------------------------------------------------------------  RADIOLOGY:  Dg Chest Port 1 View  Result Date: 08/20/2017 CLINICAL DATA:  Shortness of breath EXAM: PORTABLE CHEST 1 VIEW COMPARISON:  June 29, 2017 FINDINGS: The heart size and mediastinal contours are within normal limits. The lungs are hyperinflated. There are small bilateral pleural effusions. There is no focal infiltrate or pulmonary edema. The visualized skeletal structures are unremarkable. IMPRESSION: Small bilateral pleural effusions.  No focal pneumonia. COPD. Electronically Signed   By: Abelardo Diesel M.D.   On: 08/20/2017 14:16     ASSESSMENT AND PLAN:    57 year old female with history of tobacco dependence and COPD with chronic hypoxic respiratory failure on 2 L of oxygen who presents with shortness of breath.  1.  Acute on chronic hypoxic respiratory failure in the setting of COPD exacerbation Patient weaned off of BiPAP and back on nasal cannula  2.  Acute COPD exacerbation: Continue IV steroids, nebs and inhalers, patient clinically improving, likely discharge home tomorrow.  3. Tobacco dependence:  Patient  is encouraged to quit smoking. Counseling was provided for 4 minutes.  4.  Chronic diastolic heart failure with preserved ejection fraction:  Continue ACE inhibitor and Coreg  5.  Essential hypertension: Continue Coreg and ACE inhibitor  6.  Hyperlipidemia: Patient will need outpatient follow-up regarding this.  Management plans discussed with the patient and she is in agreement.  CODE STATUS: FULL  TOTAL TIME TAKING CARE OF THIS PATIENT: 30 minutes.     POSSIBLE D/C 2 days, DEPENDING ON CLINICAL CONDITION.   Epifanio Lesches M.D on 08/22/2017 at 11:34 AM  Between 7am to 6pm - Pager - [phone] After 6pm go to [email or web address] - password [email or web address] [place] [facility]  Office  [phone]  CC: Primary care physician; Center, [facility]  Note: This dictation was prepared with Dragon dictation along with smaller phrase technology. Any transcriptional errors that result from this process are unintentional.

## 2017-08-23 ENCOUNTER — Inpatient Hospital Stay: Payer: Medicaid Other

## 2017-08-23 ENCOUNTER — Encounter: Payer: Self-pay | Admitting: Radiology

## 2017-08-23 MED ORDER — IOPAMIDOL (ISOVUE-370) INJECTION 76%
75.0000 mL | Freq: Once | INTRAVENOUS | Status: AC | PRN
  Administered 2017-08-23: 75 mL via INTRAVENOUS

## 2017-08-23 MED ORDER — DOCUSATE SODIUM 100 MG PO CAPS
100.0000 mg | ORAL_CAPSULE | Freq: Every day | ORAL | Status: DC
  Administered 2017-08-23 – 2017-08-24 (×2): 100 mg via ORAL
  Filled 2017-08-23 (×2): qty 1

## 2017-08-23 MED ORDER — MOMETASONE FURO-FORMOTEROL FUM 200-5 MCG/ACT IN AERO
2.0000 | INHALATION_SPRAY | Freq: Two times a day (BID) | RESPIRATORY_TRACT | Status: DC
  Administered 2017-08-23 – 2017-08-24 (×2): 2 via RESPIRATORY_TRACT
  Filled 2017-08-23 (×2): qty 8.8

## 2017-08-23 NOTE — Plan of Care (Addendum)
Pt weaned back to baseline of 2.5L O2 via . Pt still with persistent SOB on exertion. Cough, pain and anxiety medication given as needed. Pain medication providing no relief but anxiety and cough medication are effective

## 2017-08-23 NOTE — Progress Notes (Signed)
Called Dr. Jannifer Franklin regarding dulera inhaler per patient request.  Appropriate orders were placed.  Christene Slates

## 2017-08-23 NOTE — Progress Notes (Signed)
CT angio chest ordered due to persistent SOB.

## 2017-08-23 NOTE — Progress Notes (Signed)
[place] at [place] NAME: Michelle [name]    MR#:  [id number]  DATE OF BIRTH:  04/25/1961  SUBJECTIVE: Admitted for COPD exacerbation, initially required BiPAP, admission to ICU but now on nasal cannula.  Patient still has shortness of breath even with minimal activity.   Marland Kitchen  REVIEW OF SYSTEMS:    Review of Systems  Constitutional: Negative for fever, chills weight loss HENT: Negative for ear pain, nosebleeds, congestion, facial swelling, rhinorrhea, neck pain, neck stiffness and ear discharge.   Respiratory: Positive for cough, shortness of breath, even with minimal activity Cardiovascular: Negative for chest pain, palpitations and leg swelling.  Gastrointestinal: Negative for heartburn, abdominal pain, vomiting, diarrhea or consitpation Genitourinary: Negative for dysuria, urgency, frequency, hematuria Musculoskeletal: Negative for back pain or joint pain Neurological: Negative for dizziness, seizures, syncope, focal weakness,  numbness and headaches.  Hematological: Does not bruise/bleed easily.  Psychiatric/Behavioral: Negative for hallucinations, confusion, dysphoric mood    Tolerating Diet: yes      DRUG ALLERGIES:  No Known Allergies  VITALS:  Blood pressure 124/71, pulse 86, temperature 98.8 F (37.1 C), temperature source Oral, resp. rate 18, height 5' (1.524 m), weight 49.9 kg (110 lb), last menstrual period 03/06/2005, SpO2 99 %.  PHYSICAL EXAMINATION:  Constitutional: Appears well-developed and well-nourished. No distress. HENT: Normocephalic. Marland Kitchen Oropharynx is clear and moist.  Eyes: Conjunctivae and EOM are normal. PERRLA, no scleral icterus.  Neck: Normal ROM. Neck supple. No JVD. No tracheal deviation. CVS: RRR, S1/S2 +, no murmurs, no gallops, no carotid bruit.  Pulmonary: Diminished air entry bilaterally.  No wheezing.  Abdominal: Soft. BS +,  no distension, tenderness, rebound or guarding.  Musculoskeletal: Normal  range of motion. No edema and no tenderness.  Neuro: Alert. CN 2-12 grossly intact. No focal deficits. Skin: Skin is warm and dry. No rash noted. Psychiatric: Normal mood and affect.      LABORATORY PANEL:   CBC Recent Labs  Lab 08/21/17 0706  WBC 11.4*  HGB 11.2*  HCT 33.7*  PLT 213   ------------------------------------------------------------------------------------------------------------------  Chemistries  Recent Labs  Lab 08/20/17 1337 08/21/17 0706  NA 140 138  K 3.8 4.1  CL 105 103  CO2 28 27  GLUCOSE 93 156*  BUN 26* 27*  CREATININE 0.75 0.62  CALCIUM 9.0 8.9  AST 18  --   ALT 13*  --   ALKPHOS 40  --   BILITOT 0.5  --    ------------------------------------------------------------------------------------------------------------------  Cardiac Enzymes Recent Labs  Lab 08/20/17 1337  TROPONINI <0.03   ------------------------------------------------------------------------------------------------------------------  RADIOLOGY:  No results found.   ASSESSMENT AND PLAN:    57 year old female with history of tobacco dependence and COPD with chronic hypoxic respiratory failure on 2 L of oxygen who presents with shortness of breath.  1.  Acute on chronic hypoxic respiratory failure in the setting of COPD exacerbation Patient weaned off of BiPAP and back on nasal cannula  2.  Acute COPD exacerbation: Has a shortness of breath even with minimal activity, poor air entry bilaterally like to stay one more day at least, continue IV steroids, bronchodilators, continue Mucinex for mucolytic effect..  3. Tobacco dependence: Patient is encouraged to quit smoking. Counseling was provided for 4 minutes.  4.  Chronic diastolic heart failure with preserved ejection fraction:  Continue ACE inhibitor and Coreg  5.  Essential hypertension: Continue Coreg and ACE inhibitor  6.  Hyperlipidemia: Patient will need outpatient follow-up regarding this.  Management  plans discussed with the patient and she is in agreement.  CODE STATUS: FULL  TOTAL TIME TAKING CARE OF THIS PATIENT: 30 minutes.     POSSIBLE D/C 2 days, DEPENDING ON CLINICAL CONDITION.   Epifanio Lesches M.D on 08/23/2017 at 1:11 PM  Between 7am to 6pm - Pager - [phone] After 6pm go to [email or web address] - password [email or web address] [facility]  Office  [phone]  CC: Primary care physician; Center, [facility]  Note: This dictation was prepared with Dragon dictation along with smaller phrase technology. Any transcriptional errors that result from this process are unintentional.

## 2017-08-24 MED ORDER — PREDNISONE 50 MG PO TABS: 50.0000 mg | ORAL_TABLET | Freq: Every day | ORAL | 0 refills | Status: AC

## 2017-08-24 MED ORDER — ALPRAZOLAM 0.5 MG PO TABS: 0.5000 mg | ORAL_TABLET | Freq: Three times a day (TID) | ORAL | 0 refills | Status: DC | PRN

## 2017-08-24 MED ORDER — AMOXICILLIN-POT CLAVULANATE 875-125 MG PO TABS: 1.0000 | ORAL_TABLET | Freq: Two times a day (BID) | ORAL | 0 refills | Status: DC

## 2017-08-24 MED ORDER — PREDNISONE 10 MG PO TABS: 10.0000 mg | ORAL_TABLET | Freq: Every day | ORAL | 0 refills | Status: DC

## 2017-08-24 MED ORDER — LEVOFLOXACIN 750 MG PO TABS: 750.0000 mg | ORAL_TABLET | Freq: Every day | ORAL | 0 refills | Status: DC

## 2017-08-24 MED ORDER — AMOXICILLIN-POT CLAVULANATE 875-125 MG PO TABS
1.0000 | ORAL_TABLET | Freq: Two times a day (BID) | ORAL | Status: DC
  Administered 2017-08-24: 1 via ORAL
  Filled 2017-08-24: qty 1

## 2017-08-24 MED ORDER — ENSURE ENLIVE PO LIQD: 237.0000 mL | Freq: Two times a day (BID) | ORAL | 12 refills | Status: DC

## 2017-08-24 NOTE — Progress Notes (Signed)
Patient discharge teaching given, including activity, diet, follow-up appoints, and medications. Patient verbalized understanding of all discharge instructions. IV access was d/c'd. Vitals are stable. Skin is intact except as charted in most recent assessments. Pt to be escorted out by NT & RN, to be driven home by family.  Aking Klabunde [employer]

## 2017-08-24 NOTE — Progress Notes (Signed)
Patient suffers from weakness which impairs their ability to perform daily activities like bathing in the home.  A walker will not resolve  issue with performing activities of daily living. A wheelchair will allow patient to safely perform daily activities. Patient can safely propel the wheelchair in the home or has a caregiver who can provide assistance.  Accessories: elevating leg rests (ELRs), wheel locks, extensions and anti-tippers.

## 2017-08-24 NOTE — Discharge Summary (Signed)
[place] at [place] NAME: Michelle [name]    MR#:  [id number]  DATE OF BIRTH:  01-Nov-1960  DATE OF ADMISSION:  08/20/2017 ADMITTING PHYSICIAN: Dustin Flock, MD  DATE OF DISCHARGE: 3.23.2019  PRIMARY CARE PHYSICIAN: [facility] [place]    ADMISSION DIAGNOSIS:  Respiratory distress [R06.03] SOB (shortness of breath) [R06.02] COPD exacerbation (HCC) [J44.1]  DISCHARGE DIAGNOSIS:  Active Problems:   Acute respiratory failure ([place]) CAP  SECONDARY DIAGNOSIS:   Past Medical History:  Diagnosis Date  . Allergy   . Anxiety   . Asthma   . CHF (congestive heart failure) ([place])   . Cocaine abuse ([place])   . COPD (chronic obstructive pulmonary disease) ([place])   . Coronary artery disease   . Emphysema/COPD ([place])   . Hepatitis C   . High cholesterol   . Hypertension   . Pneumothorax   . Tobacco abuse     HOSPITAL COURSE:    57 year old female with history of tobacco dependence and COPD with chronic hypoxic respiratory failure on 2 L of oxygen who presents with shortness of breath.  1.  Acute on chronic hypoxic respiratory failure in the setting of COPD exacerbation  community-acquired pneumonia Patient was weaned off of BiPAP and back on baseline nasal cannula  2.  Acute COPD exacerbation in the setting of pneumonia: Patient symptoms have improved.  She will be discharged with steroids.  She will continue inhaler and she is on her baseline oxygen.  3.  Community-acquired pneumonia: Initial chest x-ray did not show pneumonia.  CT scan was ordered due to persistent shortness of breath which was negative for PE however did show left lower lobe pneumonia.  She will be discharged on oral Levaquin for 5 days.  4. Tobacco dependence: Patient is encouraged to quit smoking. Counseling was provided for 4 minutes.  5.  Chronic diastolic heart failure with preserved ejection fraction:  Continue ACE inhibitor and Coreg  6  Essential  hypertension: Continue Coreg and ACE inhibitor  7.  Hyperlipidemia: Patient will need outpatient follow-up regarding this.     DISCHARGE CONDITIONS AND DIET:   Stable Cardiac diet  CONSULTS OBTAINED:    DRUG ALLERGIES:  No Known Allergies  DISCHARGE MEDICATIONS:   Allergies as of 08/24/2017   No Known Allergies     Medication List    TAKE these medications   albuterol 108 (90 Base) MCG/ACT inhaler Commonly known as:  PROVENTIL HFA;VENTOLIN HFA Inhale 2 puffs into the lungs every 6 (six) hours as needed for wheezing or shortness of breath.   ALPRAZolam 0.5 MG tablet Commonly known as:  XANAX Take 1 tablet (0.5 mg total) by mouth 3 (three) times daily as needed for anxiety. What changed:    when to take this  reasons to take this   aspirin EC 81 MG tablet Take 81 mg by mouth daily.   carvedilol 3.125 MG tablet Commonly known as:  COREG Take 1 tablet (3.125 mg total) by mouth every morning. What changed:  when to take this   enalapril 10 MG tablet Commonly known as:  VASOTEC Take 0.5 tablets (5 mg total) by mouth daily.   feeding supplement (ENSURE ENLIVE) Liqd Take 237 mLs by mouth 2 (two) times daily between meals.   guaiFENesin-codeine 100-10 MG/5ML syrup Take 10 mLs by mouth every 4 (four) hours as needed for cough.   ipratropium-albuterol 0.5-2.5 (3) MG/3ML Soln Commonly known as:  DUONEB Take 3 mLs by nebulization every  6 (six) hours as needed.   levofloxacin 750 MG tablet Commonly known as:  LEVAQUIN Take 1 tablet (750 mg total) by mouth daily.   polyethylene glycol 236 g solution Commonly known as:  GOLYTELY Drink one 8 oz glass every 20 mins until stools are clear starting at 5:00pm on 08/14/17   predniSONE 10 MG tablet Commonly known as:  DELTASONE Take 1 tablet (10 mg total) by mouth daily with breakfast. What changed:    how much to take  how to take this  when to take this  additional instructions   predniSONE 50 MG  tablet Commonly known as:  DELTASONE Take 1 tablet (50 mg total) by mouth daily with breakfast for 4 days. What changed:  You were already taking a medication with the same name, and this prescription was added. Make sure you understand how and when to take each.   SYMBICORT 160-4.5 MCG/ACT inhaler Generic drug:  budesonide-formoterol Take 2 puffs by mouth 2 (two) times daily.   tiotropium 18 MCG inhalation capsule Commonly known as:  SPIRIVA Place 1 capsule (18 mcg total) into inhaler and inhale daily.         Today   CHIEF COMPLAINT:   No acute issues overnight   VITAL SIGNS:  Blood pressure 129/79, pulse 69, temperature 97.6 F (36.4 C), temperature source Oral, resp. rate 16, height 5' (1.524 m), weight 49.9 kg (110 lb), last menstrual period 03/06/2005, SpO2 99 %.   REVIEW OF SYSTEMS:  Review of Systems  Constitutional: Negative.  Negative for chills, fever and malaise/fatigue.  HENT: Negative.  Negative for ear discharge, ear pain, hearing loss, nosebleeds and sore throat.   Eyes: Negative.  Negative for blurred vision and pain.  Respiratory: Negative.  Negative for cough, hemoptysis, shortness of breath and wheezing.   Cardiovascular: Negative.  Negative for chest pain, palpitations and leg swelling.  Gastrointestinal: Negative.  Negative for abdominal pain, blood in stool, diarrhea, nausea and vomiting.  Genitourinary: Negative.  Negative for dysuria.  Musculoskeletal: Negative.  Negative for back pain.  Skin: Negative.   Neurological: Negative for dizziness, tremors, speech change, focal weakness, seizures and headaches.  Endo/Heme/Allergies: Negative.  Does not bruise/bleed easily.  Psychiatric/Behavioral: Negative.  Negative for depression, hallucinations and suicidal ideas.     PHYSICAL EXAMINATION:  GENERAL:  57 y.o.-year-old patient lying in the bed with no acute distress.  NECK:  Supple, no jugular venous distention. No thyroid enlargement, no  tenderness.  LUNGS: Normal breath sounds bilaterally, no wheezing, rales,rhonchi  No use of accessory muscles of respiration.  CARDIOVASCULAR: S1, S2 normal. No murmurs, rubs, or gallops.  ABDOMEN: Soft, non-tender, non-distended. Bowel sounds present. No organomegaly or mass.  EXTREMITIES: No pedal edema, cyanosis, or clubbing.  PSYCHIATRIC: The patient is alert and oriented x 3.  SKIN: No obvious rash, lesion, or ulcer.   DATA REVIEW:   CBC Recent Labs  Lab 08/21/17 0706  WBC 11.4*  HGB 11.2*  HCT 33.7*  PLT 213    Chemistries  Recent Labs  Lab 08/20/17 1337 08/21/17 0706  NA 140 138  K 3.8 4.1  CL 105 103  CO2 28 27  GLUCOSE 93 156*  BUN 26* 27*  CREATININE 0.75 0.62  CALCIUM 9.0 8.9  AST 18  --   ALT 13*  --   ALKPHOS 40  --   BILITOT 0.5  --     Cardiac Enzymes Recent Labs  Lab 08/20/17 [place] <0.03    Microbiology Results  @  [email or web address]@  RADIOLOGY:  Ct Angio Chest Pe W Or Wo Contrast  Result Date: 08/23/2017 CLINICAL DATA:  Persistent shortness of breath on exertion with cough. EXAM: CT ANGIOGRAPHY CHEST WITH CONTRAST TECHNIQUE: Multidetector CT imaging of the chest was performed using the standard protocol during bolus administration of intravenous contrast. Multiplanar CT image reconstructions and MIPs were obtained to evaluate the vascular anatomy. CONTRAST:  79mL ISOVUE-370 IOPAMIDOL (ISOVUE-370) INJECTION 76% COMPARISON:  12/01/2015 and 11/04/2013 FINDINGS: Cardiovascular: Heart is normal size. Minimal calcified plaque over the left anterior descending coronary artery. Suggestion of a stent versus calcification over the lateral circumflex coronary artery. Minimal calcified plaque over the thoracic aorta. No evidence of pulmonary embolism. Mediastinum/Nodes: No mediastinal or hilar adenopathy. Remaining mediastinal structures are within normal. Lungs/Pleura: Lungs are well inflated with stable linear density over the right base compatible with  scarring. Subtle patchy density over the inferolateral aspect of the right middle lobe which may be due to an infectious, atypical infectious or inflammatory process. No evidence of effusion. Possible minimal debris within the right lower lobe bronchus. Upper Abdomen: No acute abnormality. Musculoskeletal: Minimal degenerate change of the spine. Review of the MIP images confirms the above findings. IMPRESSION: Subtle patchy density over the right middle lobe which may be due to an infectious versus atypical infectious or inflammatory process. Possible aspirate material over the right lower lobe bronchus. No evidence of pulmonary embolism. Aortic Atherosclerosis (ICD10-I70.0). Atherosclerotic coronary artery disease. Electronically Signed   By: Marin Olp M.D.   On: 08/23/2017 20:44      Allergies as of 08/24/2017   No Known Allergies     Medication List    TAKE these medications   albuterol 108 (90 Base) MCG/ACT inhaler Commonly known as:  PROVENTIL HFA;VENTOLIN HFA Inhale 2 puffs into the lungs every 6 (six) hours as needed for wheezing or shortness of breath.   ALPRAZolam 0.5 MG tablet Commonly known as:  XANAX Take 1 tablet (0.5 mg total) by mouth 3 (three) times daily as needed for anxiety. What changed:    when to take this  reasons to take this   aspirin EC 81 MG tablet Take 81 mg by mouth daily.   carvedilol 3.125 MG tablet Commonly known as:  COREG Take 1 tablet (3.125 mg total) by mouth every morning. What changed:  when to take this   enalapril 10 MG tablet Commonly known as:  VASOTEC Take 0.5 tablets (5 mg total) by mouth daily.   feeding supplement (ENSURE ENLIVE) Liqd Take 237 mLs by mouth 2 (two) times daily between meals.   guaiFENesin-codeine 100-10 MG/5ML syrup Take 10 mLs by mouth every 4 (four) hours as needed for cough.   ipratropium-albuterol 0.5-2.5 (3) MG/3ML Soln Commonly known as:  DUONEB Take 3 mLs by nebulization every 6 (six) hours as  needed.   levofloxacin 750 MG tablet Commonly known as:  LEVAQUIN Take 1 tablet (750 mg total) by mouth daily.   polyethylene glycol 236 g solution Commonly known as:  GOLYTELY Drink one 8 oz glass every 20 mins until stools are clear starting at 5:00pm on 08/14/17   predniSONE 10 MG tablet Commonly known as:  DELTASONE Take 1 tablet (10 mg total) by mouth daily with breakfast. What changed:    how much to take  how to take this  when to take this  additional instructions   predniSONE 50 MG tablet Commonly known as:  DELTASONE Take 1 tablet (50 mg total) by mouth daily with breakfast for  4 days. What changed:  You were already taking a medication with the same name, and this prescription was added. Make sure you understand how and when to take each.   SYMBICORT 160-4.5 MCG/ACT inhaler Generic drug:  budesonide-formoterol Take 2 puffs by mouth 2 (two) times daily.   tiotropium 18 MCG inhalation capsule Commonly known as:  SPIRIVA Place 1 capsule (18 mcg total) into inhaler and inhale daily.          Management plans discussed with the patient and she is in agreement. Stable for discharge home   Patient should follow up with pcp  CODE STATUS:     Code Status Orders  (From admission, onward)        Start     Ordered   08/20/17 1714  Full code  Continuous     08/20/17 1713    Code Status History    Date Active Date Inactive Code Status Order ID Comments User Context   06/29/2017 0625 07/02/2017 2008 Full Code [id number]  Harrie Foreman, MD Inpatient   02/23/2017 0241 02/26/2017 1634 Full Code [id number]  Harrie Foreman, MD Inpatient   01/01/2017 1144 01/04/2017 1354 Full Code [id number]  Bettey Costa, MD Inpatient   05/22/2016 2155 05/27/2016 1645 Full Code [id number]  Fritzi Mandes, MD Inpatient   04/23/2016 2020 04/27/2016 1848 Full Code [id number]  Hower, Aaron Mose, MD ED   02/07/2016 1922 02/10/2016 1835 Full Code [id number]  Gladstone Lighter, MD Inpatient    11/29/2015 0519 12/01/2015 1609 Full Code [id number]  Harrie Foreman, MD Inpatient   11/05/2015 0911 11/07/2015 1606 Full Code [id number]  Hillary Bow, MD ED   10/24/2014 1522 10/27/2014 1630 Full Code [id number]  Hillary Bow, MD Inpatient      TOTAL TIME TAKING CARE OF THIS PATIENT: 38 minutes.    Note: This dictation was prepared with Dragon dictation along with smaller phrase technology. Any transcriptional errors that result from this process are unintentional.  Tykira Wachs M.D on 08/24/2017 at 9:30 AM  Between 7am to 6pm - Pager - [phone] After 6pm go to [email or web address] - password [email or web address] [facility]  Office  [phone]  CC: Primary care physician; Center, [facility]

## 2017-08-24 NOTE — Care Management (Addendum)
   Patient suffers from COPD which impairs their ability to perform daily  activities like (toileting, feeding, dressing, grooming, bathing in the home. A cane, walker, crutch will not resolve issue with performing activities of daily living. A wheelchair will allow patient to safely perform daily activities.  Patient can safely propel the wheelchair in the home or has a caregiver who can provide assistance. Wheelchair has been requested from MD along with resumption of care for home health.  Jermaine with [facility] updated of this need. Patient states her daughter will provide portable O2 to home and transportation today.  No other RNCM needs.

## 2017-08-24 NOTE — Evaluation (Signed)
Physical Therapy Evaluation Patient Details Name: Michelle [name] MRN: [id number] DOB: [birth year] Today's Date: 08/24/2017   History of Present Illness  Pt is a 57 y/o F who presented to hospital w/ SOB 08/20/17. Pt admitted 08/20/17 w/ diagnosis of respiratory distress, SOB, COPD exacerbation, hypoxia, pneumonia, chronis diastolic CHF, HTN. PMHx includes: asthma, allergies, CHF, COPD, CAD, Hep C, HTN, pneumothorax, hyperlipidemia.    Clinical Impression  Pt demonstrated transfers w/ supervision and ambulation 200 ft RW w/ CGA (for safety); Pt reported SOB (125 ft) and required standing rest break before continuing ambulation 75 ft to room.  Pt HR and O2 sats were monitored throughout session and remained stable WNL while on 2.5 L oxygen (in room) and 3 L portable oxygen (when ambulating outside of room) (see mobility and ambulation for details). Pt would benefit from skilled PT to progress strength and activity tolerance. Recommend transition to [place] upon discharge from acute hospitalization.    Follow Up Recommendations Home health PT    Equipment Recommendations  Wheelchair (measurements PT);Wheelchair cushion (measurements PT);3in1 (PT);Rolling walker with 5" wheels(Pt would benefit from w/c for community distances.Marland Kitchen)    Recommendations for Other Services       Precautions / Restrictions Precautions Precautions: None Restrictions Weight Bearing Restrictions: No      Mobility  Bed Mobility Overal bed mobility: Modified Independent Bed Mobility: Supine to Sit     Supine to sit: Supervision     General bed mobility comments: Pt moves from supine in a reasonable amount of time, but reports SOB while on 2.5 L oxygen [place].  Transfers Overall transfer level: Modified independent   Transfers: Sit to/from Stand Sit to Stand: Supervision         General transfer comment: Pt demonstrated toileting with supervision at beginning of session, while on 2.5 L oxygen  [place].  Ambulation/Gait Ambulation/Gait assistance: Min guard Ambulation Distance (Feet): 200 Feet(125, 75) Assistive device: Rolling walker (2 wheeled) Gait Pattern/deviations: Step-through pattern Gait velocity: decreased   General Gait Details: Pt ambulated w/ RW CGA for safety 125 ft and reported SOB, Pt required short standing rest break and then proceeded to ambulated 75 ft back to room and sit in recliner. Pt HR and O2 sats were monitored throughout session and remained WNL while on 2.5 L oxygen (while in room) and 3 L portable oxygen when ambulating outside of room.  Stairs            Wheelchair Mobility    Modified Rankin (Stroke Patients Only)       Balance Overall balance assessment: Independent Sitting-balance support: No upper extremity supported;Feet supported Sitting balance-Leahy Scale: Normal Sitting balance - Comments: Pt demonstrated normal balance when toileting and reaching outside BOS for tissue.     Standing balance-Leahy Scale: Normal Standing balance comment: Pt demonstrated normal standing  balance when toileting.                             Pertinent Vitals/Pain Pain Assessment: No/denies pain    Home Living Family/patient expects to be discharged to:: Private residence Living Arrangements: Children Available Help at Discharge: Family;Available 24 hours/day Type of Home: Mobile home Home Access: Stairs to enter Entrance Stairs-Rails: Right;Left;Can reach both Entrance Stairs-Number of Steps: 5 Home Layout: One level Home Equipment: Cane - single point;Bedside commode      Prior Function Level of Independence: Needs assistance   Gait / Transfers Assistance Needed: Pt reports independent w/ transfers but  very limited in w/ community distances due to SOB.  ADL's / Homemaking Assistance Needed: Pt reports daughters assist w/ ADLs, grocery shopping, and house cleaning        Hand Dominance        Extremity/Trunk  Assessment   Upper Extremity Assessment Upper Extremity Assessment: Overall WFL for tasks assessed;RUE deficits/detail;LUE deficits/detail RUE Deficits / Details: Pt demonstrated BUE AROM WFL and strength at least 3/5 when reaching fully overhead. LUE Deficits / Details: Pt demonstrated BUE AROM WFL and strength at least 3/5 when reaching fully overhead.    Lower Extremity Assessment Lower Extremity Assessment: Overall WFL for tasks assessed;RLE deficits/detail;LLE deficits/detail RLE Deficits / Details: Pt demonstrated BLE AROM WFL and strength at least 3/5 when bringing knees to chest and marching at EOB. LLE Deficits / Details: Pt demonstrated BLE AROM WFL and strength at least 3/5 when bringing knees to chest and marching at EOB.    Cervical / Trunk Assessment Cervical / Trunk Assessment: Normal  Communication   Communication: No difficulties  Cognition Arousal/Alertness: Awake/alert Behavior During Therapy: WFL for tasks assessed/performed Overall Cognitive Status: Within Functional Limits for tasks assessed                                 General Comments: Pt A&O x4      General Comments  Pt agreeable to session    Exercises     Assessment/Plan    PT Assessment Patient needs continued PT services  PT Problem List Decreased strength;Decreased activity tolerance;Decreased mobility;Decreased safety awareness;Cardiopulmonary status limiting activity       PT Treatment Interventions Gait training;Stair training;Functional mobility training;Therapeutic exercise;Therapeutic activities;Patient/family education    PT Goals (Current goals can be found in the Care Plan section)  Acute Rehab PT Goals Patient Stated Goal: I want to go home. PT Goal Formulation: With patient Time For Goal Achievement: 09/07/17 Potential to Achieve Goals: Good    Frequency Min 2X/week   Barriers to discharge        Co-evaluation               AM-PAC PT "6 Clicks"  Daily Activity  Outcome Measure Difficulty turning over in bed (including adjusting bedclothes, sheets and blankets)?: None Difficulty moving from lying on back to sitting on the side of the bed? : A Little Difficulty sitting down on and standing up from a chair with arms (e.g., wheelchair, bedside commode, etc,.)?: A Little Help needed moving to and from a bed to chair (including a wheelchair)?: A Little Help needed walking in hospital room?: A Little Help needed climbing 3-5 steps with a railing? : A Little 6 Click Score: 19    End of Session Equipment Utilized During Treatment: Gait belt;Oxygen Activity Tolerance: Other (comment)(SOB) Patient left: in chair;with call bell/phone within reach;with chair alarm set;with nursing/sitter in room(Pt stated that she did not want heels elevated by pillow.) Nurse Communication: Mobility status PT Visit Diagnosis: Difficulty in walking, not elsewhere classified (R26.2);Muscle weakness (generalized) (M62.81);Other (comment);Other abnormalities of gait and mobility (R26.89)(SOB, decreased activity tolerance)    Time: 0973-5329 PT Time Calculation (min) (ACUTE ONLY): 30 min   Charges:         PT G Codes:        Cortni Tays Mondrian-Pardue, SPT 08/24/2017, 12:03 PM

## 2017-08-25 LAB — CULTURE, BLOOD (ROUTINE X 2)
CULTURE: NO GROWTH
CULTURE: NO GROWTH
SPECIAL REQUESTS: ADEQUATE

## 2017-09-02 ENCOUNTER — Telehealth: Payer: Self-pay

## 2017-09-02 NOTE — Telephone Encounter (Signed)
Flagged on EMMI report for not having a follow up appointment scheduled.  1st attempt to reach patient made 09/02/17 at 11:25am, though had to leave voicemail encouraging patient to call back.  Will attempt at later time.

## 2017-09-03 NOTE — Telephone Encounter (Signed)
2nd attempt to reach patient made 09/03/17 at 9:40am.  Left another voicemail encouraging patient to call back.

## 2017-09-04 IMAGING — DX DG CHEST 1V PORT
1 series · 1 of 1 positions shown · non-contrast
Comparison: 04/23/2016, 02/07/2016

CLINICAL DATA: Shortness of breath

EXAM:
PORTABLE CHEST 1 VIEW

[chest ap]
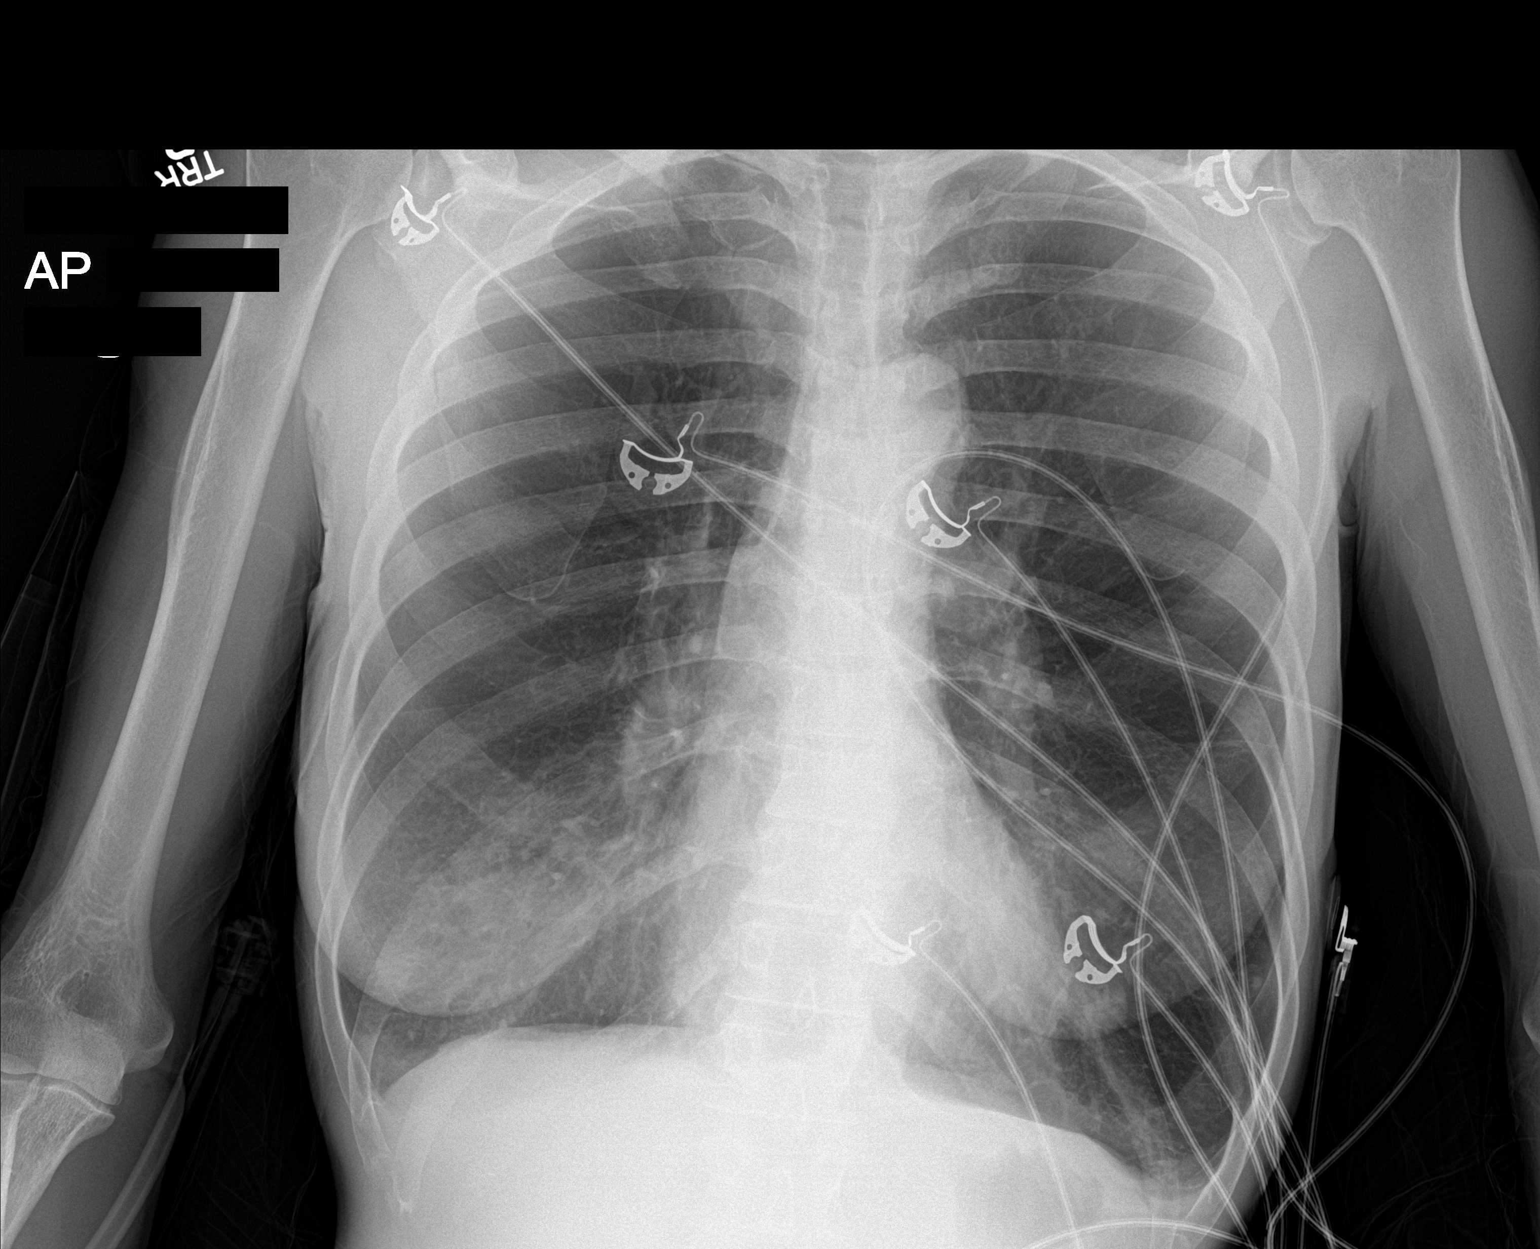

[1 of 1 positions shown; findings below may reference images not displayed]

FINDINGS: The lungs are hyperinflated. Asymmetric increased opacity at the
right lung base, suspicious for an infiltrate. Stable
cardiomediastinal silhouette. No pneumothorax.
IMPRESSION: Hyperinflation with asymmetric right lower lung opacity suspicious
for infiltrate.

## 2017-09-08 IMAGING — CR DG CHEST 2V
1 series · 2 of 2 positions shown · non-contrast
Comparison: 05/22/2016

CLINICAL DATA: COPD, recent pneumonia 05/22/2016, weakness

EXAM:
CHEST  2 VIEW

[Series 1: dg chest 2 view · 0.14mm/px · 2 of 2 slices shown]
[im 1/2]
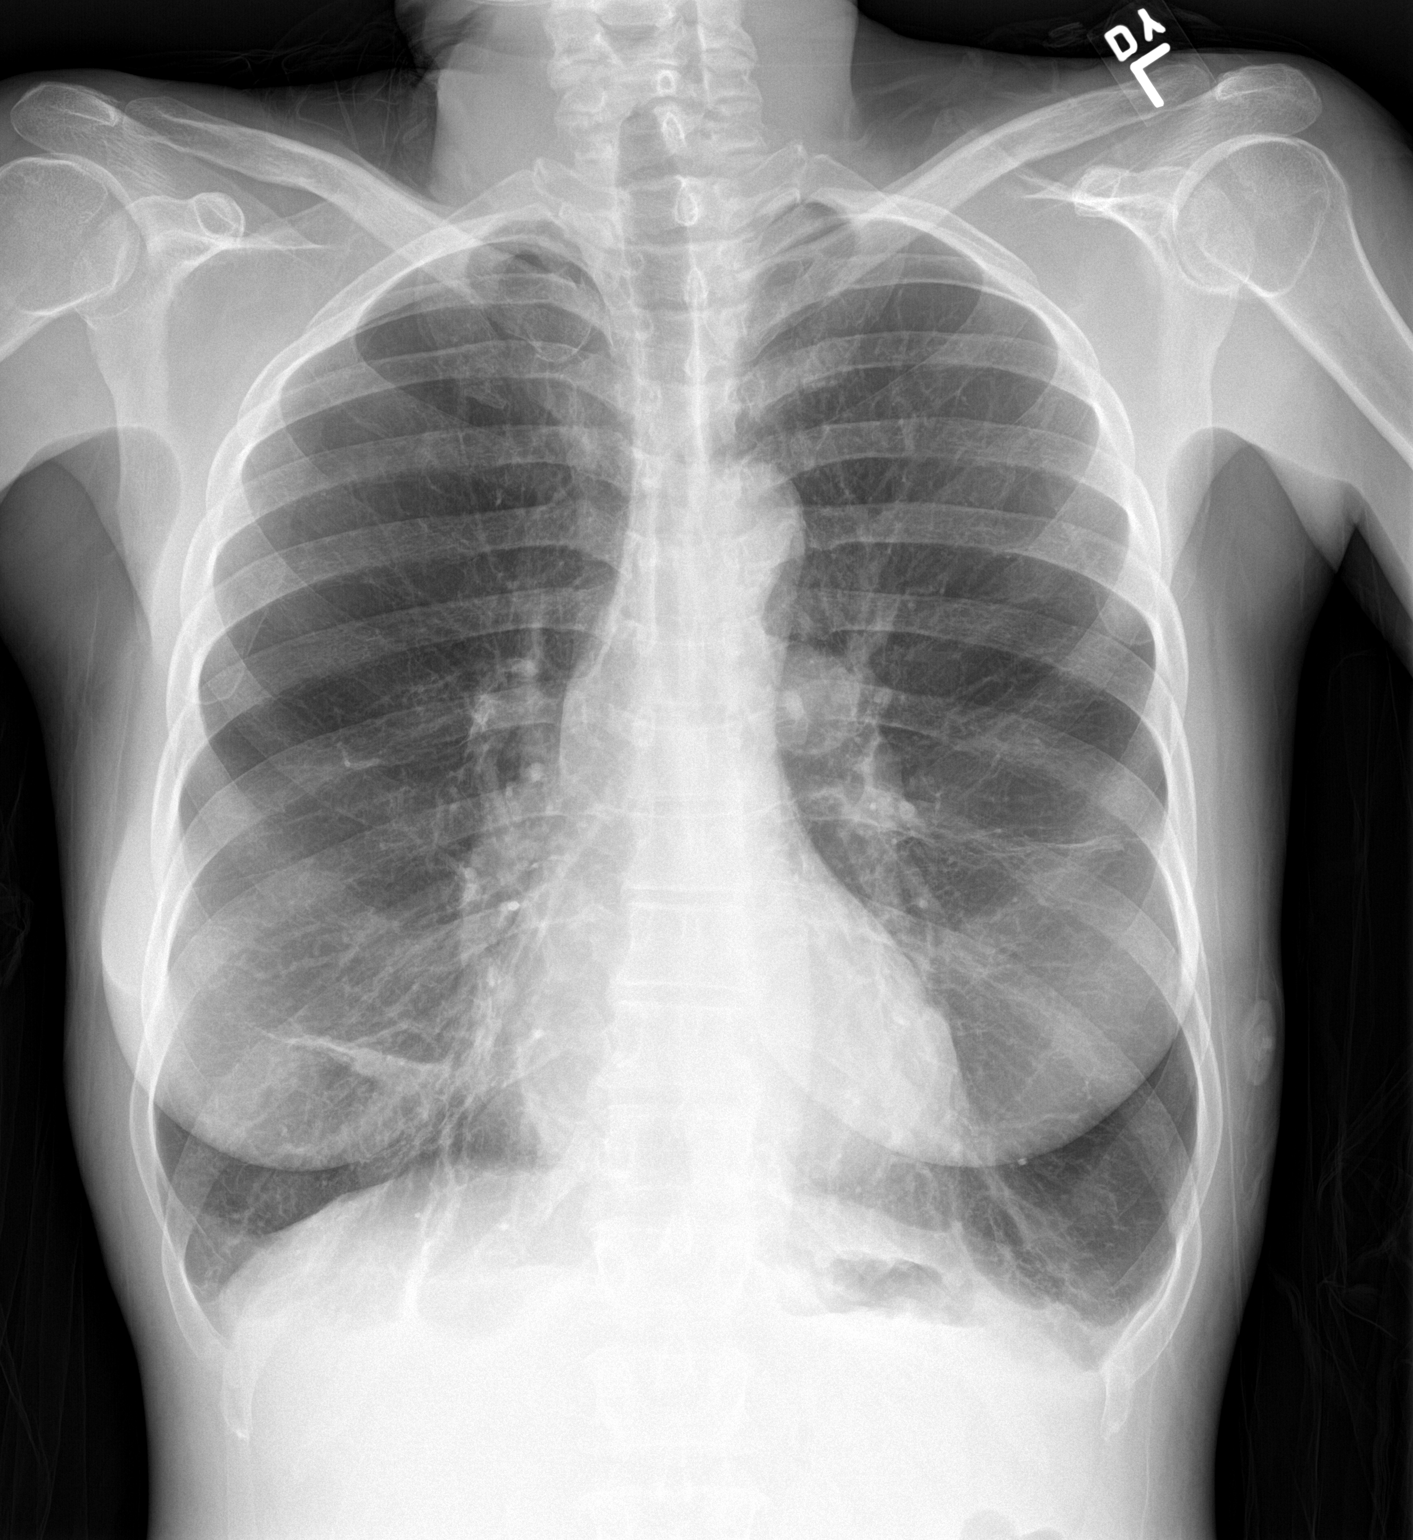
[im 2/2]
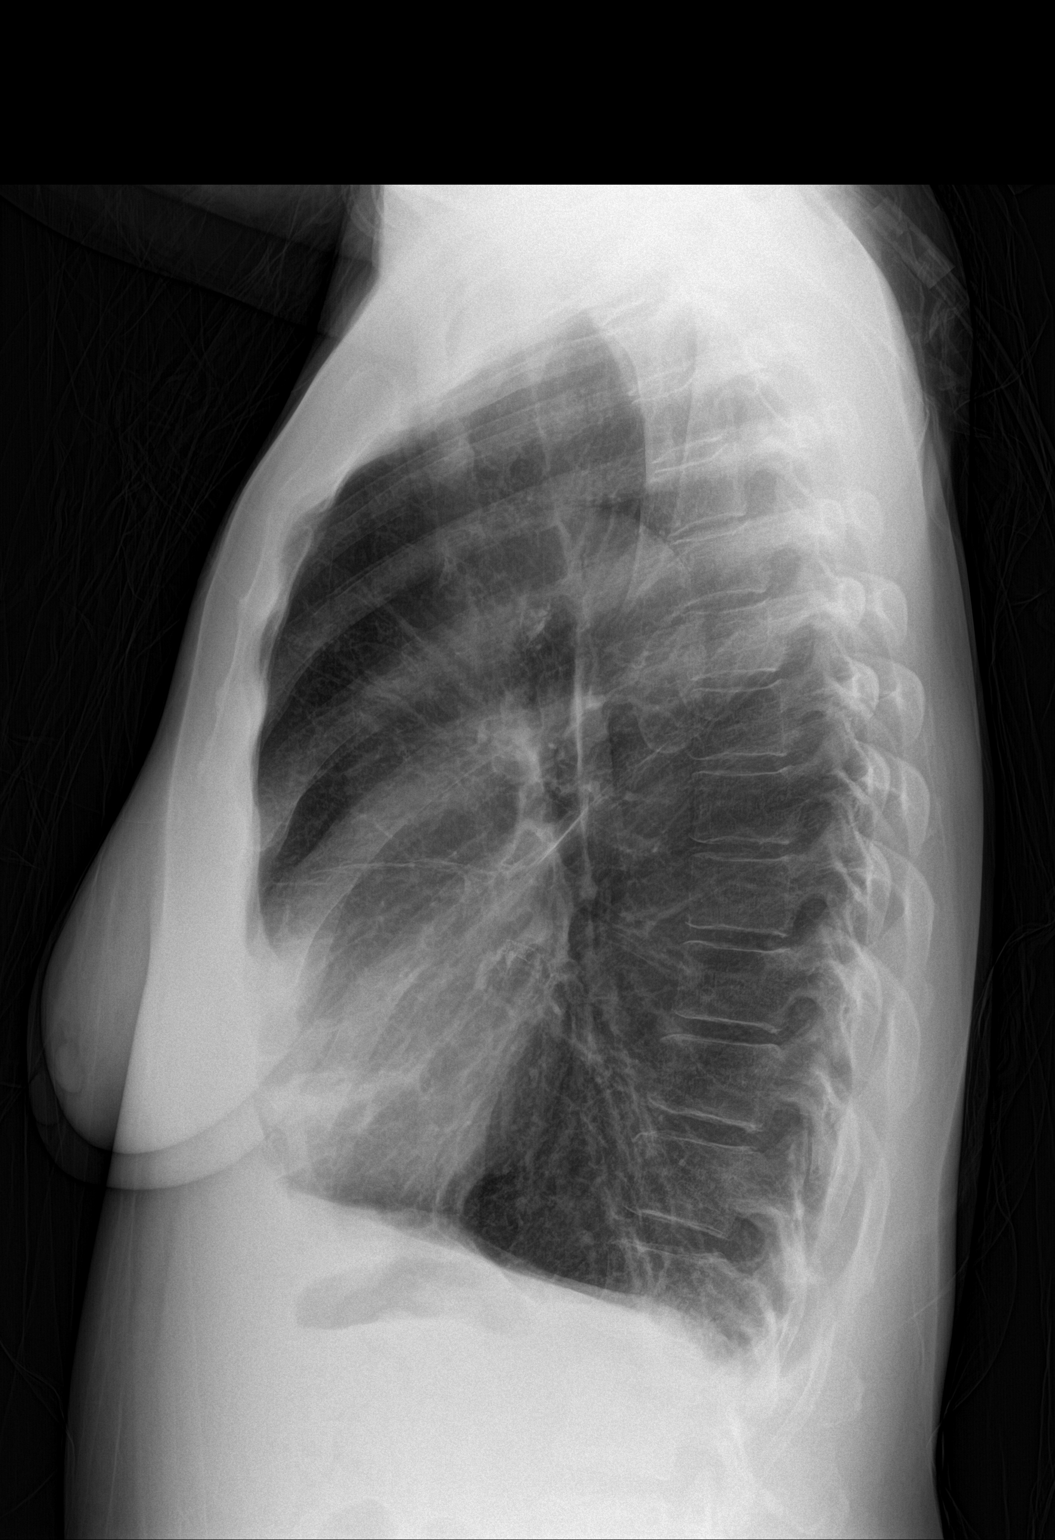

[2 of 2 positions shown; findings below may reference images not displayed]

FINDINGS: Background hyperinflation evident compatible with COPD/ emphysema.
Improvement in the patchy right lower lobe airspace process
compatible with resolving right lower lobe pneumonia. Residual
basilar atelectasis and left midlung scarring noted. Upper lobes
remain clear. No edema, effusion or pneumothorax. Trachea is
midline. Atherosclerosis of the aorta.
IMPRESSION: Hyperinflation compatible with COPD/emphysema.

Resolving right lower lobe pneumonia

Bibasilar atelectasis and left midlung scarring

Thoracic aortic atherosclerosis

## 2017-09-30 ENCOUNTER — Emergency Department: Payer: Medicaid Other

## 2017-09-30 ENCOUNTER — Inpatient Hospital Stay: Payer: Medicaid Other

## 2017-09-30 ENCOUNTER — Encounter: Payer: Self-pay | Admitting: Emergency Medicine

## 2017-09-30 ENCOUNTER — Inpatient Hospital Stay
Admission: EM | Admit: 2017-09-30 | Discharge: 2017-12-31 | DRG: 004 | Disposition: A | Discharge status: Home Health | Payer: Medicaid Other | Attending: Internal Medicine | Admitting: Internal Medicine

## 2017-09-30 DIAGNOSIS — J9622 Acute and chronic respiratory failure with hypercapnia: Secondary | ICD-10-CM | POA: Diagnosis present

## 2017-09-30 DIAGNOSIS — R1319 Other dysphagia: Secondary | ICD-10-CM | POA: Diagnosis present

## 2017-09-30 DIAGNOSIS — R609 Edema, unspecified: Secondary | ICD-10-CM

## 2017-09-30 DIAGNOSIS — Z978 Presence of other specified devices: Secondary | ICD-10-CM

## 2017-09-30 DIAGNOSIS — L8990 Pressure ulcer of unspecified site, unspecified stage: Secondary | ICD-10-CM

## 2017-09-30 DIAGNOSIS — F329 Major depressive disorder, single episode, unspecified: Secondary | ICD-10-CM | POA: Diagnosis present

## 2017-09-30 DIAGNOSIS — B1920 Unspecified viral hepatitis C without hepatic coma: Secondary | ICD-10-CM | POA: Diagnosis present

## 2017-09-30 DIAGNOSIS — N170 Acute kidney failure with tubular necrosis: Secondary | ICD-10-CM | POA: Diagnosis present

## 2017-09-30 DIAGNOSIS — L89152 Pressure ulcer of sacral region, stage 2: Secondary | ICD-10-CM | POA: Diagnosis present

## 2017-09-30 DIAGNOSIS — R0602 Shortness of breath: Secondary | ICD-10-CM

## 2017-09-30 DIAGNOSIS — J95851 Ventilator associated pneumonia: Secondary | ICD-10-CM | POA: Diagnosis not present

## 2017-09-30 DIAGNOSIS — Z8249 Family history of ischemic heart disease and other diseases of the circulatory system: Secondary | ICD-10-CM

## 2017-09-30 DIAGNOSIS — G6281 Critical illness polyneuropathy: Secondary | ICD-10-CM | POA: Diagnosis present

## 2017-09-30 DIAGNOSIS — Y848 Other medical procedures as the cause of abnormal reaction of the patient, or of later complication, without mention of misadventure at the time of the procedure: Secondary | ICD-10-CM | POA: Diagnosis not present

## 2017-09-30 DIAGNOSIS — R4182 Altered mental status, unspecified: Secondary | ICD-10-CM

## 2017-09-30 DIAGNOSIS — J9601 Acute respiratory failure with hypoxia: Secondary | ICD-10-CM | POA: Diagnosis not present

## 2017-09-30 DIAGNOSIS — Z7951 Long term (current) use of inhaled steroids: Secondary | ICD-10-CM

## 2017-09-30 DIAGNOSIS — E781 Pure hyperglyceridemia: Secondary | ICD-10-CM | POA: Diagnosis present

## 2017-09-30 DIAGNOSIS — R6521 Severe sepsis with septic shock: Secondary | ICD-10-CM | POA: Diagnosis present

## 2017-09-30 DIAGNOSIS — T17990A Other foreign object in respiratory tract, part unspecified in causing asphyxiation, initial encounter: Secondary | ICD-10-CM | POA: Diagnosis not present

## 2017-09-30 DIAGNOSIS — Z9911 Dependence on respirator [ventilator] status: Secondary | ICD-10-CM

## 2017-09-30 DIAGNOSIS — J9621 Acute and chronic respiratory failure with hypoxia: Secondary | ICD-10-CM | POA: Diagnosis present

## 2017-09-30 DIAGNOSIS — E7800 Pure hypercholesterolemia, unspecified: Secondary | ICD-10-CM | POA: Diagnosis present

## 2017-09-30 DIAGNOSIS — Y95 Nosocomial condition: Secondary | ICD-10-CM | POA: Diagnosis not present

## 2017-09-30 DIAGNOSIS — I5032 Chronic diastolic (congestive) heart failure: Secondary | ICD-10-CM | POA: Diagnosis present

## 2017-09-30 DIAGNOSIS — F17210 Nicotine dependence, cigarettes, uncomplicated: Secondary | ICD-10-CM | POA: Diagnosis present

## 2017-09-30 DIAGNOSIS — J939 Pneumothorax, unspecified: Secondary | ICD-10-CM | POA: Diagnosis present

## 2017-09-30 DIAGNOSIS — Z23 Encounter for immunization: Secondary | ICD-10-CM

## 2017-09-30 DIAGNOSIS — J441 Chronic obstructive pulmonary disease with (acute) exacerbation: Secondary | ICD-10-CM | POA: Diagnosis present

## 2017-09-30 DIAGNOSIS — J189 Pneumonia, unspecified organism: Secondary | ICD-10-CM

## 2017-09-30 DIAGNOSIS — J15211 Pneumonia due to Methicillin susceptible Staphylococcus aureus: Secondary | ICD-10-CM | POA: Diagnosis present

## 2017-09-30 DIAGNOSIS — I248 Other forms of acute ischemic heart disease: Secondary | ICD-10-CM | POA: Diagnosis present

## 2017-09-30 DIAGNOSIS — Z931 Gastrostomy status: Secondary | ICD-10-CM

## 2017-09-30 DIAGNOSIS — R000 Tachycardia, unspecified: Secondary | ICD-10-CM | POA: Diagnosis not present

## 2017-09-30 DIAGNOSIS — E872 Acidosis: Secondary | ICD-10-CM | POA: Diagnosis present

## 2017-09-30 DIAGNOSIS — J398 Other specified diseases of upper respiratory tract: Secondary | ICD-10-CM

## 2017-09-30 DIAGNOSIS — R601 Generalized edema: Secondary | ICD-10-CM | POA: Diagnosis not present

## 2017-09-30 DIAGNOSIS — F411 Generalized anxiety disorder: Secondary | ICD-10-CM | POA: Diagnosis present

## 2017-09-30 DIAGNOSIS — J15212 Pneumonia due to Methicillin resistant Staphylococcus aureus: Secondary | ICD-10-CM | POA: Diagnosis not present

## 2017-09-30 DIAGNOSIS — J9620 Acute and chronic respiratory failure, unspecified whether with hypoxia or hypercapnia: Secondary | ICD-10-CM

## 2017-09-30 DIAGNOSIS — R0603 Acute respiratory distress: Secondary | ICD-10-CM

## 2017-09-30 DIAGNOSIS — E876 Hypokalemia: Secondary | ICD-10-CM | POA: Diagnosis present

## 2017-09-30 DIAGNOSIS — A419 Sepsis, unspecified organism: Secondary | ICD-10-CM | POA: Diagnosis present

## 2017-09-30 DIAGNOSIS — R509 Fever, unspecified: Secondary | ICD-10-CM

## 2017-09-30 DIAGNOSIS — G8929 Other chronic pain: Secondary | ICD-10-CM | POA: Diagnosis present

## 2017-09-30 DIAGNOSIS — D638 Anemia in other chronic diseases classified elsewhere: Secondary | ICD-10-CM | POA: Diagnosis present

## 2017-09-30 DIAGNOSIS — F14129 Cocaine abuse with intoxication, unspecified: Secondary | ICD-10-CM | POA: Diagnosis present

## 2017-09-30 DIAGNOSIS — Z888 Allergy status to other drugs, medicaments and biological substances status: Secondary | ICD-10-CM

## 2017-09-30 DIAGNOSIS — E43 Unspecified severe protein-calorie malnutrition: Secondary | ICD-10-CM | POA: Diagnosis present

## 2017-09-30 DIAGNOSIS — G7281 Critical illness myopathy: Secondary | ICD-10-CM | POA: Diagnosis present

## 2017-09-30 DIAGNOSIS — J440 Chronic obstructive pulmonary disease with acute lower respiratory infection: Secondary | ICD-10-CM | POA: Diagnosis present

## 2017-09-30 DIAGNOSIS — J449 Chronic obstructive pulmonary disease, unspecified: Secondary | ICD-10-CM | POA: Diagnosis present

## 2017-09-30 DIAGNOSIS — N179 Acute kidney failure, unspecified: Secondary | ICD-10-CM

## 2017-09-30 DIAGNOSIS — F410 Panic disorder [episodic paroxysmal anxiety] without agoraphobia: Secondary | ICD-10-CM

## 2017-09-30 DIAGNOSIS — I2510 Atherosclerotic heart disease of native coronary artery without angina pectoris: Secondary | ICD-10-CM | POA: Diagnosis present

## 2017-09-30 DIAGNOSIS — J9811 Atelectasis: Secondary | ICD-10-CM

## 2017-09-30 DIAGNOSIS — I110 Hypertensive heart disease with heart failure: Secondary | ICD-10-CM | POA: Diagnosis present

## 2017-09-30 DIAGNOSIS — Z7189 Other specified counseling: Secondary | ICD-10-CM | POA: Diagnosis not present

## 2017-09-30 DIAGNOSIS — R451 Restlessness and agitation: Secondary | ICD-10-CM | POA: Diagnosis present

## 2017-09-30 DIAGNOSIS — Z7952 Long term (current) use of systemic steroids: Secondary | ICD-10-CM

## 2017-09-30 DIAGNOSIS — Z825 Family history of asthma and other chronic lower respiratory diseases: Secondary | ICD-10-CM

## 2017-09-30 DIAGNOSIS — Z4659 Encounter for fitting and adjustment of other gastrointestinal appliance and device: Secondary | ICD-10-CM

## 2017-09-30 DIAGNOSIS — Z681 Body mass index (BMI) 19 or less, adult: Secondary | ICD-10-CM

## 2017-09-30 DIAGNOSIS — I952 Hypotension due to drugs: Secondary | ICD-10-CM | POA: Diagnosis not present

## 2017-09-30 DIAGNOSIS — R739 Hyperglycemia, unspecified: Secondary | ICD-10-CM | POA: Diagnosis not present

## 2017-09-30 DIAGNOSIS — Z801 Family history of malignant neoplasm of trachea, bronchus and lung: Secondary | ICD-10-CM

## 2017-09-30 DIAGNOSIS — R680 Hypothermia, not associated with low environmental temperature: Secondary | ICD-10-CM | POA: Diagnosis not present

## 2017-09-30 DIAGNOSIS — Z515 Encounter for palliative care: Secondary | ICD-10-CM | POA: Diagnosis not present

## 2017-09-30 DIAGNOSIS — R4702 Dysphasia: Secondary | ICD-10-CM | POA: Diagnosis present

## 2017-09-30 DIAGNOSIS — R1310 Dysphagia, unspecified: Secondary | ICD-10-CM | POA: Diagnosis present

## 2017-09-30 DIAGNOSIS — R579 Shock, unspecified: Secondary | ICD-10-CM | POA: Diagnosis present

## 2017-09-30 DIAGNOSIS — E869 Volume depletion, unspecified: Secondary | ICD-10-CM | POA: Diagnosis present

## 2017-09-30 DIAGNOSIS — Z9981 Dependence on supplemental oxygen: Secondary | ICD-10-CM

## 2017-09-30 DIAGNOSIS — J9690 Respiratory failure, unspecified, unspecified whether with hypoxia or hypercapnia: Secondary | ICD-10-CM

## 2017-09-30 DIAGNOSIS — J9600 Acute respiratory failure, unspecified whether with hypoxia or hypercapnia: Secondary | ICD-10-CM

## 2017-09-30 DIAGNOSIS — L89302 Pressure ulcer of unspecified buttock, stage 2: Secondary | ICD-10-CM | POA: Diagnosis present

## 2017-09-30 DIAGNOSIS — Z79899 Other long term (current) drug therapy: Secondary | ICD-10-CM

## 2017-09-30 DIAGNOSIS — Z7982 Long term (current) use of aspirin: Secondary | ICD-10-CM

## 2017-09-30 DIAGNOSIS — M25571 Pain in right ankle and joints of right foot: Secondary | ICD-10-CM

## 2017-09-30 DIAGNOSIS — R197 Diarrhea, unspecified: Secondary | ICD-10-CM | POA: Diagnosis not present

## 2017-09-30 DIAGNOSIS — K567 Ileus, unspecified: Secondary | ICD-10-CM

## 2017-09-30 DIAGNOSIS — E785 Hyperlipidemia, unspecified: Secondary | ICD-10-CM | POA: Diagnosis present

## 2017-09-30 DIAGNOSIS — I340 Nonrheumatic mitral (valve) insufficiency: Secondary | ICD-10-CM | POA: Diagnosis not present

## 2017-09-30 LAB — CBC
HCT: 36.3 % (ref 35.0–47.0)
HEMOGLOBIN: 11.5 g/dL — AB (ref 12.0–16.0)
MCH: 28.4 pg (ref 26.0–34.0)
MCHC: 31.8 g/dL — ABNORMAL LOW (ref 32.0–36.0)
MCV: 89.6 fL (ref 80.0–100.0)
Platelets: 283 10*3/uL (ref 150–440)
RBC: 4.05 MIL/uL (ref 3.80–5.20)
RDW: 14.3 % (ref 11.5–14.5)
WBC: 20.4 10*3/uL — AB (ref 3.6–11.0)

## 2017-09-30 LAB — URINALYSIS, ROUTINE W REFLEX MICROSCOPIC
BILIRUBIN URINE: NEGATIVE
Glucose, UA: NEGATIVE mg/dL
Hgb urine dipstick: NEGATIVE
KETONES UR: NEGATIVE mg/dL
Leukocytes, UA: NEGATIVE
NITRITE: NEGATIVE
PH: 5 (ref 5.0–8.0)
PROTEIN: 100 mg/dL — AB
Specific Gravity, Urine: 1.018 (ref 1.005–1.030)

## 2017-09-30 LAB — CREATININE, SERUM
CREATININE: 1.3 mg/dL — AB (ref 0.44–1.00)
GFR calc Af Amer: 52 mL/min — ABNORMAL LOW (ref >60)
GFR, EST NON AFRICAN AMERICAN: 45 mL/min — AB (ref >60)

## 2017-09-30 LAB — COMPREHENSIVE METABOLIC PANEL
ALBUMIN: 3.6 g/dL (ref 3.5–5.0)
ALK PHOS: 46 U/L (ref 38–126)
ALT: 26 U/L (ref 14–54)
AST: 26 U/L (ref 15–41)
Anion gap: 9 (ref 5–15)
BUN: 37 mg/dL — ABNORMAL HIGH (ref 6–20)
CALCIUM: 8.9 mg/dL (ref 8.9–10.3)
CO2: 31 mmol/L (ref 22–32)
CREATININE: 0.89 mg/dL (ref 0.44–1.00)
Chloride: 103 mmol/L (ref 101–111)
GFR calc Af Amer: >60 mL/min (ref >60)
GFR calc non Af Amer: >60 mL/min (ref >60)
GLUCOSE: 110 mg/dL — AB (ref 65–99)
Potassium: 4 mmol/L (ref 3.5–5.1)
SODIUM: 143 mmol/L (ref 135–145)
Total Bilirubin: 0.4 mg/dL (ref 0.3–1.2)
Total Protein: 6.7 g/dL (ref 6.5–8.1)

## 2017-09-30 LAB — URINE DRUG SCREEN, QUALITATIVE (ARMC ONLY)
AMPHETAMINES, UR SCREEN: NOT DETECTED
BARBITURATES, UR SCREEN: NOT DETECTED
BENZODIAZEPINE, UR SCRN: NOT DETECTED
Cannabinoid 50 Ng, Ur ~~LOC~~: NOT DETECTED
Cocaine Metabolite,Ur ~~LOC~~: POSITIVE — AB
MDMA (Ecstasy)Ur Screen: NOT DETECTED
METHADONE SCREEN, URINE: NOT DETECTED
OPIATE, UR SCREEN: NOT DETECTED
Phencyclidine (PCP) Ur S: NOT DETECTED
TRICYCLIC, UR SCREEN: NOT DETECTED

## 2017-09-30 LAB — LACTIC ACID, PLASMA: Lactic Acid, Venous: 0.8 mmol/L (ref 0.5–1.9)

## 2017-09-30 LAB — INFLUENZA PANEL BY PCR (TYPE A & B)
INFLAPCR: NEGATIVE
Influenza B By PCR: NEGATIVE

## 2017-09-30 LAB — CBC WITH DIFFERENTIAL/PLATELET
BASOS PCT: 0 %
Basophils Absolute: 0.1 10*3/uL (ref 0–0.1)
EOS ABS: 0.1 10*3/uL (ref 0–0.7)
Eosinophils Relative: 1 %
HEMATOCRIT: 40.6 % (ref 35.0–47.0)
Hemoglobin: 12.8 g/dL (ref 12.0–16.0)
Lymphocytes Relative: 4 %
Lymphs Abs: 0.7 10*3/uL — ABNORMAL LOW (ref 1.0–3.6)
MCH: 28.4 pg (ref 26.0–34.0)
MCHC: 31.6 g/dL — AB (ref 32.0–36.0)
MCV: 89.8 fL (ref 80.0–100.0)
MONOS PCT: 9 %
Monocytes Absolute: 1.7 10*3/uL — ABNORMAL HIGH (ref 0.2–0.9)
NEUTROS ABS: 17.4 10*3/uL — AB (ref 1.4–6.5)
Neutrophils Relative %: 86 %
Platelets: 294 10*3/uL (ref 150–440)
RBC: 4.52 MIL/uL (ref 3.80–5.20)
RDW: 14.5 % (ref 11.5–14.5)
WBC: 20 10*3/uL — ABNORMAL HIGH (ref 3.6–11.0)

## 2017-09-30 LAB — BLOOD GAS, ARTERIAL
Acid-base deficit: 2 mmol/L (ref 0.0–2.0)
BICARBONATE: 27.2 mmol/L (ref 20.0–28.0)
FIO2: 0.4
MECHVT: 450 mL
O2 SAT: 86.2 %
PCO2 ART: 68 mmHg — AB (ref 32.0–48.0)
PEEP/CPAP: 5 cmH2O
PH ART: 7.21 — AB (ref 7.350–7.450)
PO2 ART: 63 mmHg — AB (ref 83.0–108.0)
Patient temperature: 37
RATE: 16 resp/min

## 2017-09-30 LAB — BRAIN NATRIURETIC PEPTIDE: B Natriuretic Peptide: 66 pg/mL (ref 0.0–100.0)

## 2017-09-30 LAB — TROPONIN I
Troponin I: 0.04 ng/mL (ref <0.03)
Troponin I: 0.05 ng/mL (ref <0.03)

## 2017-09-30 LAB — MRSA PCR SCREENING: MRSA BY PCR: NEGATIVE

## 2017-09-30 LAB — GLUCOSE, CAPILLARY
GLUCOSE-CAPILLARY: 190 mg/dL — AB (ref 65–99)
GLUCOSE-CAPILLARY: 194 mg/dL — AB (ref 65–99)

## 2017-09-30 LAB — PROCALCITONIN: Procalcitonin: 0.13 ng/mL

## 2017-09-30 MED ORDER — FAMOTIDINE IN NACL 20-0.9 MG/50ML-% IV SOLN
20.0000 mg | INTRAVENOUS | Status: DC
  Administered 2017-09-30 – 2017-10-06 (×7): 20 mg via INTRAVENOUS
  Filled 2017-09-30 (×7): qty 50

## 2017-09-30 MED ORDER — HEPARIN SODIUM (PORCINE) 5000 UNIT/ML IJ SOLN
5000.0000 [IU] | Freq: Three times a day (TID) | INTRAMUSCULAR | Status: AC
  Administered 2017-09-30 – 2017-11-18 (×145): 5000 [IU] via SUBCUTANEOUS
  Filled 2017-09-30 (×143): qty 1

## 2017-09-30 MED ORDER — PROPOFOL 1000 MG/100ML IV EMUL
5.0000 ug/kg/min | INTRAVENOUS | Status: DC
  Administered 2017-09-30 – 2017-10-01 (×2): 25 ug/kg/min via INTRAVENOUS
  Administered 2017-10-02: 20 ug/kg/min via INTRAVENOUS
  Filled 2017-09-30 (×3): qty 100

## 2017-09-30 MED ORDER — PROPOFOL 1000 MG/100ML IV EMUL
5.0000 ug/kg/min | Freq: Once | INTRAVENOUS | Status: AC
  Administered 2017-09-30: 15 ug/kg/min via INTRAVENOUS

## 2017-09-30 MED ORDER — NOREPINEPHRINE 4 MG/250ML-% IV SOLN
0.0000 ug/min | Freq: Once | INTRAVENOUS | Status: AC
  Administered 2017-09-30: 5 ug/min via INTRAVENOUS
  Filled 2017-09-30: qty 250

## 2017-09-30 MED ORDER — NOREPINEPHRINE BITARTRATE 1 MG/ML IV SOLN
0.0000 ug/min | INTRAVENOUS | Status: DC
  Administered 2017-09-30: 4 ug/min via INTRAVENOUS
  Administered 2017-10-07: 2 ug/min via INTRAVENOUS
  Filled 2017-09-30 (×3): qty 16

## 2017-09-30 MED ORDER — BUDESONIDE 0.25 MG/2ML IN SUSP
0.2500 mg | Freq: Two times a day (BID) | RESPIRATORY_TRACT | Status: DC
  Administered 2017-09-30 – 2017-10-01 (×2): 0.25 mg via RESPIRATORY_TRACT
  Filled 2017-09-30 (×2): qty 2

## 2017-09-30 MED ORDER — LEVOFLOXACIN IN D5W 750 MG/150ML IV SOLN
750.0000 mg | Freq: Once | INTRAVENOUS | Status: AC
  Administered 2017-09-30: 750 mg via INTRAVENOUS
  Filled 2017-09-30: qty 150

## 2017-09-30 MED ORDER — NOREPINEPHRINE 4 MG/250ML-% IV SOLN
0.0000 ug/min | INTRAVENOUS | Status: DC
  Administered 2017-09-30: 10 ug/min via INTRAVENOUS

## 2017-09-30 MED ORDER — KETAMINE HCL 10 MG/ML IJ SOLN
100.0000 mg | Freq: Once | INTRAMUSCULAR | Status: AC
  Administered 2017-09-30: 10 mg via INTRAVENOUS

## 2017-09-30 MED ORDER — FENTANYL CITRATE (PF) 100 MCG/2ML IJ SOLN
100.0000 ug | INTRAMUSCULAR | Status: DC | PRN
  Administered 2017-10-01 – 2017-10-02 (×5): 100 ug via INTRAVENOUS
  Filled 2017-09-30 (×5): qty 2

## 2017-09-30 MED ORDER — PROPOFOL 1000 MG/100ML IV EMUL
INTRAVENOUS | Status: AC
  Filled 2017-09-30: qty 100

## 2017-09-30 MED ORDER — LEVOFLOXACIN IN D5W 500 MG/100ML IV SOLN
500.0000 mg | Freq: Every day | INTRAVENOUS | Status: DC
  Filled 2017-09-30: qty 100

## 2017-09-30 MED ORDER — IPRATROPIUM-ALBUTEROL 0.5-2.5 (3) MG/3ML IN SOLN
3.0000 mL | RESPIRATORY_TRACT | Status: DC
Start: 2017-09-30 — End: 2017-10-15
  Administered 2017-09-30 – 2017-10-15 (×88): 3 mL via RESPIRATORY_TRACT
  Filled 2017-09-30 (×87): qty 3

## 2017-09-30 MED ORDER — SUCCINYLCHOLINE CHLORIDE 20 MG/ML IJ SOLN
100.0000 mg | Freq: Once | INTRAMUSCULAR | Status: AC
  Administered 2017-09-30: 100 mg via INTRAVENOUS

## 2017-09-30 MED ORDER — LEVOFLOXACIN IN D5W 750 MG/150ML IV SOLN
INTRAVENOUS | Status: AC
Start: 2017-09-30 — End: 2017-10-01
  Administered 2017-09-30: 750 mg via INTRAVENOUS
  Filled 2017-09-30: qty 150

## 2017-09-30 MED ORDER — ORAL CARE MOUTH RINSE
15.0000 mL | OROMUCOSAL | Status: DC
  Administered 2017-10-01 – 2017-12-31 (×809): 15 mL via OROMUCOSAL

## 2017-09-30 MED ORDER — METHYLPREDNISOLONE SODIUM SUCC 125 MG IJ SOLR
125.0000 mg | Freq: Once | INTRAMUSCULAR | Status: AC
  Administered 2017-09-30: 125 mg via INTRAVENOUS
  Filled 2017-09-30: qty 2

## 2017-09-30 MED ORDER — SODIUM CHLORIDE 0.9 % IV BOLUS
1000.0000 mL | Freq: Once | INTRAVENOUS | Status: AC
  Administered 2017-09-30: 1000 mL via INTRAVENOUS

## 2017-09-30 MED ORDER — DOCUSATE SODIUM 100 MG PO CAPS: 100.0000 mg | ORAL_CAPSULE | Freq: Two times a day (BID) | ORAL | Status: DC | PRN

## 2017-09-30 MED ORDER — FENTANYL CITRATE (PF) 2500 MCG/50ML IJ SOLN
100.0000 ug/h | INTRAMUSCULAR | Status: DC
  Administered 2017-09-30: 100 ug/h via INTRAVENOUS

## 2017-09-30 MED ORDER — INSULIN ASPART 100 UNIT/ML ~~LOC~~ SOLN
0.0000 [IU] | SUBCUTANEOUS | Status: DC
  Administered 2017-09-30 – 2017-10-01 (×2): 2 [IU] via SUBCUTANEOUS
  Administered 2017-10-01 – 2017-10-02 (×2): 1 [IU] via SUBCUTANEOUS
  Administered 2017-10-02: 3 [IU] via SUBCUTANEOUS
  Administered 2017-10-02 – 2017-10-03 (×3): 1 [IU] via SUBCUTANEOUS
  Administered 2017-10-03 – 2017-10-04 (×5): 2 [IU] via SUBCUTANEOUS
  Administered 2017-10-04 – 2017-10-05 (×3): 1 [IU] via SUBCUTANEOUS
  Administered 2017-10-05 (×3): 2 [IU] via SUBCUTANEOUS
  Administered 2017-10-05: 1 [IU] via SUBCUTANEOUS
  Administered 2017-10-05: 2 [IU] via SUBCUTANEOUS
  Administered 2017-10-05: 1 [IU] via SUBCUTANEOUS
  Administered 2017-10-06: 2 [IU] via SUBCUTANEOUS
  Administered 2017-10-06: 1 [IU] via SUBCUTANEOUS
  Administered 2017-10-06: 2 [IU] via SUBCUTANEOUS
  Administered 2017-10-06 – 2017-10-07 (×5): 1 [IU] via SUBCUTANEOUS
  Administered 2017-10-07: 2 [IU] via SUBCUTANEOUS
  Administered 2017-10-08: 1 [IU] via SUBCUTANEOUS
  Administered 2017-10-08: 3 [IU] via SUBCUTANEOUS
  Administered 2017-10-08: 2 [IU] via SUBCUTANEOUS
  Administered 2017-10-08: 1 [IU] via SUBCUTANEOUS
  Administered 2017-10-08: 2 [IU] via SUBCUTANEOUS
  Administered 2017-10-08: 3 [IU] via SUBCUTANEOUS
  Administered 2017-10-09 – 2017-10-10 (×4): 1 [IU] via SUBCUTANEOUS
  Administered 2017-10-10: 2 [IU] via SUBCUTANEOUS
  Administered 2017-10-10 – 2017-10-13 (×12): 1 [IU] via SUBCUTANEOUS
  Administered 2017-10-13: 2 [IU] via SUBCUTANEOUS
  Administered 2017-10-13: 1 [IU] via SUBCUTANEOUS
  Administered 2017-10-13 – 2017-10-14 (×3): 2 [IU] via SUBCUTANEOUS
  Administered 2017-10-14: 3 [IU] via SUBCUTANEOUS
  Administered 2017-10-14 – 2017-10-15 (×3): 1 [IU] via SUBCUTANEOUS
  Administered 2017-10-15: 2 [IU] via SUBCUTANEOUS
  Administered 2017-10-15: 1 [IU] via SUBCUTANEOUS
  Administered 2017-10-16 (×3): 2 [IU] via SUBCUTANEOUS
  Administered 2017-10-16 – 2017-10-17 (×2): 1 [IU] via SUBCUTANEOUS
  Administered 2017-10-17 – 2017-10-18 (×2): 2 [IU] via SUBCUTANEOUS
  Administered 2017-10-18: 1 [IU] via SUBCUTANEOUS
  Administered 2017-10-18: 2 [IU] via SUBCUTANEOUS
  Administered 2017-10-18 – 2017-10-19 (×2): 1 [IU] via SUBCUTANEOUS
  Administered 2017-10-19: 2 [IU] via SUBCUTANEOUS
  Administered 2017-10-19 (×3): 1 [IU] via SUBCUTANEOUS
  Administered 2017-10-20: 2 [IU] via SUBCUTANEOUS
  Administered 2017-10-20 (×5): 1 [IU] via SUBCUTANEOUS
  Administered 2017-10-21: 2 [IU] via SUBCUTANEOUS
  Administered 2017-10-21 (×2): 1 [IU] via SUBCUTANEOUS
  Administered 2017-10-21: 2 [IU] via SUBCUTANEOUS
  Administered 2017-10-22: 1 [IU] via SUBCUTANEOUS
  Administered 2017-10-22 (×2): 2 [IU] via SUBCUTANEOUS
  Administered 2017-10-23 (×3): 1 [IU] via SUBCUTANEOUS
  Administered 2017-10-23 – 2017-10-24 (×3): 2 [IU] via SUBCUTANEOUS
  Administered 2017-10-24: 1 [IU] via SUBCUTANEOUS
  Administered 2017-10-24: 2 [IU] via SUBCUTANEOUS
  Administered 2017-10-24 (×2): 1 [IU] via SUBCUTANEOUS
  Administered 2017-10-25 (×4): 2 [IU] via SUBCUTANEOUS
  Administered 2017-10-26: 1 [IU] via SUBCUTANEOUS
  Administered 2017-10-26: 2 [IU] via SUBCUTANEOUS
  Administered 2017-10-26: 1 [IU] via SUBCUTANEOUS
  Administered 2017-10-26: 2 [IU] via SUBCUTANEOUS
  Administered 2017-10-26: 1 [IU] via SUBCUTANEOUS
  Administered 2017-10-26: 2 [IU] via SUBCUTANEOUS
  Administered 2017-10-27 – 2017-10-28 (×6): 1 [IU] via SUBCUTANEOUS
  Administered 2017-10-28: 2 [IU] via SUBCUTANEOUS
  Administered 2017-10-28 – 2017-11-02 (×12): 1 [IU] via SUBCUTANEOUS
  Administered 2017-11-02: 2 [IU] via SUBCUTANEOUS
  Administered 2017-11-02: 1 [IU] via SUBCUTANEOUS
  Administered 2017-11-02: 2 [IU] via SUBCUTANEOUS
  Administered 2017-11-02 – 2017-11-04 (×7): 1 [IU] via SUBCUTANEOUS
  Administered 2017-11-04 (×2): 2 [IU] via SUBCUTANEOUS
  Administered 2017-11-05 – 2017-11-06 (×10): 1 [IU] via SUBCUTANEOUS
  Administered 2017-11-07: 2 [IU] via SUBCUTANEOUS
  Administered 2017-11-07: 1 [IU] via SUBCUTANEOUS
  Administered 2017-11-07 (×2): 2 [IU] via SUBCUTANEOUS
  Administered 2017-11-07: 1 [IU] via SUBCUTANEOUS
  Administered 2017-11-07: 5 [IU] via SUBCUTANEOUS
  Administered 2017-11-08: 2 [IU] via SUBCUTANEOUS
  Administered 2017-11-08 (×4): 1 [IU] via SUBCUTANEOUS
  Administered 2017-11-09: 2 [IU] via SUBCUTANEOUS
  Administered 2017-11-09 (×2): 1 [IU] via SUBCUTANEOUS
  Administered 2017-11-09: 2 [IU] via SUBCUTANEOUS
  Administered 2017-11-10 – 2017-11-16 (×8): 1 [IU] via SUBCUTANEOUS
  Administered 2017-11-17: 2 [IU] via SUBCUTANEOUS
  Administered 2017-11-17 – 2017-11-19 (×3): 1 [IU] via SUBCUTANEOUS
  Filled 2017-09-30 (×174): qty 1

## 2017-09-30 MED ORDER — METHYLPREDNISOLONE SODIUM SUCC 125 MG IJ SOLR: 60.0000 mg | Freq: Four times a day (QID) | INTRAMUSCULAR | Status: DC

## 2017-09-30 MED ORDER — SODIUM CHLORIDE 0.9 % IV SOLN
INTRAVENOUS | Status: DC
  Administered 2017-09-30 – 2017-10-03 (×4): via INTRAVENOUS

## 2017-09-30 MED ORDER — ALBUTEROL SULFATE (2.5 MG/3ML) 0.083% IN NEBU
INHALATION_SOLUTION | RESPIRATORY_TRACT | Status: AC
  Filled 2017-09-30: qty 24

## 2017-09-30 MED ORDER — FENTANYL CITRATE (PF) 100 MCG/2ML IJ SOLN
100.0000 ug | INTRAMUSCULAR | Status: AC | PRN
  Administered 2017-10-01 (×3): 100 ug via INTRAVENOUS
  Filled 2017-09-30 (×3): qty 2

## 2017-09-30 MED ORDER — FENTANYL CITRATE (PF) 100 MCG/2ML IJ SOLN
100.0000 ug | Freq: Once | INTRAMUSCULAR | Status: AC
  Administered 2017-09-30: 100 ug via INTRAVENOUS

## 2017-09-30 MED ORDER — CHLORHEXIDINE GLUCONATE 0.12% ORAL RINSE (MEDLINE KIT)
15.0000 mL | Freq: Two times a day (BID) | OROMUCOSAL | Status: DC
  Administered 2017-09-30 – 2017-12-31 (×172): 15 mL via OROMUCOSAL

## 2017-09-30 MED ORDER — ALBUTEROL (5 MG/ML) CONTINUOUS INHALATION SOLN
20.0000 mg | INHALATION_SOLUTION | Freq: Once | RESPIRATORY_TRACT | Status: AC
  Administered 2017-09-30: 20 mg via RESPIRATORY_TRACT
  Filled 2017-09-30: qty 20

## 2017-09-30 MED ORDER — IPRATROPIUM-ALBUTEROL 0.5-2.5 (3) MG/3ML IN SOLN
3.0000 mL | Freq: Four times a day (QID) | RESPIRATORY_TRACT | Status: DC | PRN
  Filled 2017-09-30: qty 3

## 2017-09-30 MED ORDER — METHYLPREDNISOLONE SODIUM SUCC 125 MG IJ SOLR
60.0000 mg | Freq: Three times a day (TID) | INTRAMUSCULAR | Status: DC
Start: 2017-10-01 — End: 2017-10-03
  Administered 2017-10-01 – 2017-10-03 (×7): 60 mg via INTRAVENOUS
  Filled 2017-09-30 (×7): qty 2

## 2017-09-30 MED ORDER — SODIUM CHLORIDE 0.9 % IV SOLN
0.0000 ug/h | INTRAVENOUS | Status: DC
  Administered 2017-09-30: 100 ug/h via INTRAVENOUS
  Filled 2017-09-30: qty 50

## 2017-09-30 NOTE — Progress Notes (Signed)
eLink Physician-Brief Progress Note Patient Name: BESAN KETCHEM DOB: 06/17/1960 MRN: [id number]   Date of Service  09/30/2017  HPI/Events of Note  57 y.o. female with a known history of anxiety, CHF, cocaine abuse, COPD, coronary artery disease, hepatitis C, hypertension- as severely short of breath at home and so called EMS. On the way EMT gave her 5-6 breathing treatments but she was still extremely short of breath in the emergency room on arrival, so ER physician intubated her. PCCM asked to assume care in the ICU. Patient now on Norepinephrine for hemodynamic support.   eICU Interventions  No new orders.     Intervention Category Evaluation Type: New Patient Evaluation  Lysle Dingwall 09/30/2017, 7:44 PM

## 2017-09-30 NOTE — ED Triage Notes (Signed)
Brought by ems from home for shorthess of breath.  History copd with admission last week.  Pt on bypap by ems and switched over to ours by cardiopulmonary.  Pt groaning with each breath. Opens eyes.  Tripoding.  4 gr magnesium, 3 albuterol nebs and 2 duonebs by ems.

## 2017-09-30 NOTE — ED Notes (Signed)
Ns WIDE OPEN VIA CENTRAL LINE.

## 2017-09-30 NOTE — H&P (Signed)
[facility] [place] NAME: Michelle [name]    MR#:  [id number]  DATE OF BIRTH:  09/27/1960  DATE OF ADMISSION:  09/30/2017  PRIMARY CARE PHYSICIAN: [facility] [place]   REQUESTING/REFERRING PHYSICIAN: [place]  CHIEF COMPLAINT:   Chief Complaint  Patient presents with  . Shortness of Breath    HISTORY OF PRESENT ILLNESS: Michelle [name]  is a 57 y.o. female with a known history of anxiety, CHF, cocaine abuse, COPD, coronary artery disease, hepatitis C, hypertension- as severely short of breath at home and so called EMS. On the way EMT gave her 5-6 breathing treatments but she was still extremely short of breath in the emergency room on arrival, so ER physician intubated her. Chest x-ray looks clear, patient was slightly hypotensive after intubation and has slightly elevated white blood cell count. A central line placed and she started on norepinephrine. Given to admit to ICU for COPD exacerbation and respiratory failure. No family at bedside and patient is intubated.  PAST MEDICAL HISTORY:   Past Medical History:  Diagnosis Date  . Allergy   . Anxiety   . Asthma   . CHF (congestive heart failure) ([place])   . Cocaine abuse ([place])   . COPD (chronic obstructive pulmonary disease) ([place])   . Coronary artery disease   . Emphysema/COPD ([place])   . Hepatitis C   . High cholesterol   . Hypertension   . Pneumothorax   . Tobacco abuse     PAST SURGICAL HISTORY:  Past Surgical History:  Procedure Laterality Date  . CARDIAC CATHETERIZATION    . CHEST TUBE INSERTION      SOCIAL HISTORY:  Social History   Tobacco Use  . Smoking status: Current Every Day Smoker    Packs/day: 0.10    Years: 41.00    Pack years: 4.10    Types: Cigarettes  . Smokeless tobacco: Never Used  Substance Use Topics  . Alcohol use: No    Frequency: Never    FAMILY HISTORY:  Family History  Problem Relation Age of Onset  . Lung cancer Mother   . Asthma  Mother   . CVA Father   . CAD Father   . Stroke Father   . Breast cancer Maternal Aunt 67  . COPD Sister     DRUG ALLERGIES: No Known Allergies  REVIEW OF SYSTEMS:   Patient cannot give a review of system because of intubation.  MEDICATIONS AT HOME:  Prior to Admission medications   Medication Sig Start Date End Date Taking? Authorizing Provider  albuterol (PROVENTIL HFA;VENTOLIN HFA) 108 (90 Base) MCG/ACT inhaler Inhale 2 puffs into the lungs every 6 (six) hours as needed for wheezing or shortness of breath. 02/26/17   Dustin Flock, MD  ALPRAZolam Duanne Moron) 0.5 MG tablet Take 1 tablet (0.5 mg total) by mouth 3 (three) times daily as needed for anxiety. 08/24/17   Bettey Costa, MD  aspirin EC 81 MG tablet Take 81 mg by mouth daily.    [provider]  carvedilol (COREG) 3.125 MG tablet Take 1 tablet (3.125 mg total) by mouth every morning. Patient taking differently: Take 3.125 mg by mouth 2 (two) times daily with a meal.  02/10/16   Gladstone Lighter, MD  enalapril (VASOTEC) 10 MG tablet Take 0.5 tablets (5 mg total) by mouth daily. 02/10/16   Gladstone Lighter, MD  feeding supplement, ENSURE ENLIVE, (ENSURE ENLIVE) LIQD Take 237 mLs by mouth 2 (two) times daily between  meals. 08/24/17   Bettey Costa, MD  guaiFENesin-codeine 100-10 MG/5ML syrup Take 10 mLs by mouth every 4 (four) hours as needed for cough. Patient not taking: Reported on 08/20/2017 07/02/17   Demetrios Loll, MD  ipratropium-albuterol (DUONEB) 0.5-2.5 (3) MG/3ML SOLN Take 3 mLs by nebulization every 6 (six) hours as needed. 01/04/17   [name], MD  levofloxacin (LEVAQUIN) 750 MG tablet Take 1 tablet (750 mg total) by mouth daily. 08/24/17   Bettey Costa, MD  polyethylene glycol (GOLYTELY) 236 g solution Drink one 8 oz glass every 20 mins until stools are clear starting at 5:00pm on 08/14/17 07/26/17   Lucilla Lame, MD  predniSONE (DELTASONE) 10 MG tablet Take 1 tablet (10 mg total) by mouth daily with breakfast.  08/24/17   Bettey Costa, MD  SYMBICORT 160-4.5 MCG/ACT inhaler Take 2 puffs by mouth 2 (two) times daily.    [provider]  tiotropium (SPIRIVA) 18 MCG inhalation capsule Place 1 capsule (18 mcg total) into inhaler and inhale daily. 01/04/17   [name], MD      PHYSICAL EXAMINATION:   VITAL SIGNS: Weight 45.7 kg (100 lb 12.8 oz), last menstrual period 03/06/2005.  GENERAL:  57 y.o.-year-old patient lying in the bed with no acute distress.  EYES: Pupils equal, round, reactive to light and accommodation. No scleral icterus. Extraocular muscles intact.  HEENT: Head atraumatic, normocephalic. Oropharynx and nasopharynx clear. ET tube in place. NECK:  Supple, no jugular venous distention. No thyroid enlargement, no tenderness.  LUNGS: Normal breath sounds bilaterally, b/l wheezing, no crepitation. No use of accessory muscles of respiration. On vent support now. CARDIOVASCULAR: S1, S2 normal. No murmurs, rubs, or gallops.  ABDOMEN: Soft, nontender, nondistended. Bowel sounds present. No organomegaly or mass.  EXTREMITIES: No pedal edema, cyanosis, or clubbing.  NEUROLOGIC: Patient is sedated on ventilatory support currently.  PSYCHIATRIC: The patient is sedated on ventilator.  SKIN: No obvious rash, lesion, or ulcer.   LABORATORY PANEL:   CBC Recent Labs  Lab 09/30/17 1705  WBC 20.0*  HGB 12.8  HCT 40.6  PLT 294  MCV 89.8  MCH 28.4  MCHC 31.6*  RDW 14.5  LYMPHSABS 0.7*  MONOABS 1.7*  EOSABS 0.1  BASOSABS 0.1   ------------------------------------------------------------------------------------------------------------------  Chemistries  Recent Labs  Lab 09/30/17 1705  NA 143  K 4.0  CL 103  CO2 31  GLUCOSE 110*  BUN 37*  CREATININE 0.89  CALCIUM 8.9  AST 26  ALT 26  ALKPHOS 46  BILITOT 0.4   ------------------------------------------------------------------------------------------------------------------ estimated creatinine clearance is  50.7 mL/min (by C-G formula based on SCr of 0.89 mg/dL). ------------------------------------------------------------------------------------------------------------------ No results for input(s): TSH, T4TOTAL, T3FREE, THYROIDAB in the last 72 hours.  Invalid input(s): FREET3   Coagulation profile No results for input(s): INR, PROTIME in the last 168 hours. ------------------------------------------------------------------------------------------------------------------- No results for input(s): DDIMER in the last 72 hours. -------------------------------------------------------------------------------------------------------------------  Cardiac Enzymes Recent Labs  Lab 09/30/17 1705  TROPONINI 0.04*   ------------------------------------------------------------------------------------------------------------------ Invalid input(s): POCBNP  ---------------------------------------------------------------------------------------------------------------  Urinalysis    Component Value Date/Time   COLORURINE YELLOW (A) 08/20/2017 2212   APPEARANCEUR HAZY (A) 08/20/2017 2212   APPEARANCEUR Clear 10/30/2013 1230   LABSPEC 1.032 (H) 08/20/2017 2212   LABSPEC 1.005 10/30/2013 1230   PHURINE 5.0 08/20/2017 2212   GLUCOSEU >=500 (A) 08/20/2017 2212   GLUCOSEU Negative 10/30/2013 [place] 08/20/2017 2212   BILIRUBINUR NEGATIVE 08/20/2017 2212   BILIRUBINUR Negative 10/30/2013 [place] 08/20/2017 2212   PROTEINUR  100 (A) 08/20/2017 2212   NITRITE NEGATIVE 08/20/2017 2212   LEUKOCYTESUR NEGATIVE 08/20/2017 2212   LEUKOCYTESUR Negative 10/30/2013 1230     RADIOLOGY: Dg Chest Port 1 View  Result Date: 09/30/2017 CLINICAL DATA:  Status post intubation. EXAM: PORTABLE CHEST 1 VIEW COMPARISON:  September 30, 2017 FINDINGS: The ETT is in good position. No pneumothorax. Tiny effusions with atelectasis. No other changes. Emphysema. IMPRESSION: 1. The ETT is in  good position.  No other changes. Electronically Signed   By: Dorise Bullion III M.D   On: 09/30/2017 17:28   Dg Chest Port 1 View  Result Date: 09/30/2017 CLINICAL DATA:  Shortness of breath. EXAM: PORTABLE CHEST 1 VIEW COMPARISON:  CT scan August 23, 2017.  Chest x-ray August 20, 2017. FINDINGS: Hyperexpansion of the lungs consistent with emphysema. Tiny pleural effusions and underlying atelectasis. No other interval changes. IMPRESSION: Emphysema.  Tiny pleural effusions and underlying atelectasis. Electronically Signed   By: Dorise Bullion III M.D   On: 09/30/2017 17:27    EKG: Orders placed or performed during the hospital encounter of 08/20/17  . EKG    IMPRESSION AND PLAN:  * acute on chronic respiratory failure with hypoxia   COPD extubation    IV steroids and nebulizer therapy   Levaquin to treat bronchitis.   Continue ventilatory support and intensivist to help with further management.  * hypotension   Could be due to respiratory failure and intubation   iV fluid bolus, antibiotics for now.   Lactic acid is normal.   Continue IV fluids and monitor.   Currently on norepinephrine drip.  * history of hypertension   Hold medications for now.  * elevated white blood cell count   Likely secondary to bronchitis or infection, continue monitoring.  All the records are reviewed and case discussed with ED provider. Management plans discussed with the patient, family and they are in agreement.  CODE STATUS:full code. Code Status History    Date Active Date Inactive Code Status Order ID Comments User Context   08/20/2017 1713 08/24/2017 1743 Full Code [id number]  Dustin Flock, MD Inpatient   06/29/2017 0625 07/02/2017 2008 Full Code [id number]  Harrie Foreman, MD Inpatient   02/23/2017 0241 02/26/2017 1634 Full Code [id number]  Harrie Foreman, MD Inpatient   01/01/2017 1144 01/04/2017 1354 Full Code [id number]  Bettey Costa, MD Inpatient   05/22/2016 2155 05/27/2016 1645 Full  Code [id number]  Fritzi Mandes, MD Inpatient   04/23/2016 2020 04/27/2016 1848 Full Code [id number]  Hower, Aaron Mose, MD ED   02/07/2016 1922 02/10/2016 1835 Full Code [id number]  Gladstone Lighter, MD Inpatient   11/29/2015 0519 12/01/2015 1609 Full Code [id number]  Harrie Foreman, MD Inpatient   11/05/2015 0911 11/07/2015 1606 Full Code [id number]  Hillary Bow, MD ED   10/24/2014 1522 10/27/2014 1630 Full Code [id number]  Hillary Bow, MD Inpatient       TOTAL TIME TAKING CARE OF THIS PATIENT: 50 critical care minutes.    [name] M.D on 09/30/2017   Between 7am to 6pm - Pager - [phone]  After 6pm go to [email or web address] - password [email or web address] [place] [facility]  Office  [phone]  CC: Primary care physician; [facility]   Note: This dictation was prepared with Dragon dictation along with smaller phrase technology. Any transcriptional errors that result from this process are unintentional.

## 2017-09-30 NOTE — Progress Notes (Signed)
Transported pt to ICU 18 on ventilator without incident. Pt remains on ventilator and tolerating well at this time. Report given to ICU RT.

## 2017-09-30 NOTE — Progress Notes (Signed)
CODE SEPSIS - PHARMACY COMMUNICATION  **Broad Spectrum Antibiotics should be administered within 1 hour of Sepsis diagnosis**  Time Code Sepsis Called/Page Received: 1717  Antibiotics Ordered: Levaquin  Time of 1st antibiotic administration: 1721  Additional action taken by pharmacy:   If necessary, Name of Provider/Nurse Contacted:     Napoleon Form ,PharmD Clinical Pharmacist  09/30/2017  6:46 PM

## 2017-09-30 NOTE — Progress Notes (Signed)
Pharmacy Antibiotic Note  Michelle [name] is a 57 y.o. female admitted on 09/30/2017 with AECOPD.  Pharmacy has been consulted for Levaquin dosing.  Plan: Levaquin 750 mg iv once given then 500 mg iv daily.   Weight: 100 lb 12.8 oz (45.7 kg)  No data recorded.  Recent Labs  Lab 09/30/17 1705 09/30/17 1706  WBC 20.0*  --   CREATININE 0.89  --   LATICACIDVEN  --  0.8    Estimated Creatinine Clearance: 50.7 mL/min (by C-G formula based on SCr of 0.89 mg/dL).    No Known Allergies  Antimicrobials this admission: Levaquin 4/29 >>   Dose adjustments this admission:  Microbiology results:  Thank you for allowing pharmacy to be a part of this patient's care.  Napoleon Form 09/30/2017 6:52 PM

## 2017-09-30 NOTE — ED Notes (Signed)
Pt had agreed to intubation prior to rik meds.  Relaxed with meds.  Taken off bipap and intubated by dr Mable Paris with 7.5 ett at 25 cm at tooth.  Good color change with co2 detector.  baggin by cardiopulmonary.

## 2017-09-30 NOTE — ED Notes (Signed)
Patient's daughter is at bedside. 

## 2017-09-30 NOTE — ED Provider Notes (Signed)
[facility] Emergency Department Provider Note  ____________________________________________   None    (approximate)  I have reviewed the triage vital signs and the nursing notes.   HISTORY  Chief Complaint Shortness of Breath  History is limited by the patient's clinical condition  HPI Michelle [name] is a 57 y.o. female who comes to the emergency department via EMS in respiratory distress.  She has a long-standing history of severe COPD and EMS had a prolonged transport time today roughly 45 minutes to an hour.  They found the patient she was in severe respiratory distress and immediately placed her on CPAP.  She was given 3 DuoNeb's, 3 albuterol treatments, and 4 g of magnesium in route.  Past Medical History:  Diagnosis Date  . Allergy   . Anxiety   . Asthma   . CHF (congestive heart failure) ([place])   . Cocaine abuse ([place])   . COPD (chronic obstructive pulmonary disease) ([place])   . Coronary artery disease   . Emphysema/COPD ([place])   . Hepatitis C   . High cholesterol   . Hypertension   . Pneumothorax   . Tobacco abuse     Patient Active Problem List   Diagnosis Date Noted  . Acute on chronic respiratory failure ([place]) 09/30/2017  . Acute respiratory failure ([place]) 08/20/2017  . Acute on chronic respiratory failure with hypoxia and hypercapnia ([place]) 06/29/2017  . Acute on chronic respiratory failure with hypoxia ([place]) 02/23/2017  . Protein-calorie malnutrition, severe 05/23/2016  . Sepsis ([place]) 05/22/2016  . Malnutrition of moderate degree ([place]) 10/25/2014  . COPD exacerbation ([place]) 10/24/2014  . Tobacco abuse 10/24/2014    Past Surgical History:  Procedure Laterality Date  . CARDIAC CATHETERIZATION    . CHEST TUBE INSERTION      Prior to Admission medications   Medication Sig Start Date End Date Taking? Authorizing Provider  albuterol (PROVENTIL HFA;VENTOLIN HFA) 108 (90 Base) MCG/ACT inhaler Inhale 2 puffs into the lungs every 6 (six)  hours as needed for wheezing or shortness of breath. 02/26/17   Dustin Flock, MD  ALPRAZolam Duanne Moron) 0.5 MG tablet Take 1 tablet (0.5 mg total) by mouth 3 (three) times daily as needed for anxiety. 08/24/17   Bettey Costa, MD  aspirin EC 81 MG tablet Take 81 mg by mouth daily.    [provider]  carvedilol (COREG) 3.125 MG tablet Take 1 tablet (3.125 mg total) by mouth every morning. Patient taking differently: Take 3.125 mg by mouth 2 (two) times daily with a meal.  02/10/16   Gladstone Lighter, MD  enalapril (VASOTEC) 10 MG tablet Take 0.5 tablets (5 mg total) by mouth daily. 02/10/16   Gladstone Lighter, MD  feeding supplement, ENSURE ENLIVE, (ENSURE ENLIVE) LIQD Take 237 mLs by mouth 2 (two) times daily between meals. 08/24/17   Bettey Costa, MD  guaiFENesin-codeine 100-10 MG/5ML syrup Take 10 mLs by mouth every 4 (four) hours as needed for cough. Patient not taking: Reported on 08/20/2017 07/02/17   Demetrios Loll, MD  ipratropium-albuterol (DUONEB) 0.5-2.5 (3) MG/3ML SOLN Take 3 mLs by nebulization every 6 (six) hours as needed. 01/04/17   [name], MD  levofloxacin (LEVAQUIN) 750 MG tablet Take 1 tablet (750 mg total) by mouth daily. 08/24/17   Bettey Costa, MD  polyethylene glycol (GOLYTELY) 236 g solution Drink one 8 oz glass every 20 mins until stools are clear starting at 5:00pm on 08/14/17 07/26/17   Lucilla Lame, MD  predniSONE (DELTASONE) 10 MG tablet  Take 1 tablet (10 mg total) by mouth daily with breakfast. 08/24/17   Bettey Costa, MD  SYMBICORT 160-4.5 MCG/ACT inhaler Take 2 puffs by mouth 2 (two) times daily.    [provider]  tiotropium (SPIRIVA) 18 MCG inhalation capsule Place 1 capsule (18 mcg total) into inhaler and inhale daily. 01/04/17   [name], MD    Allergies Patient has no known allergies.  Family History  Problem Relation Age of Onset  . Lung cancer Mother   . Asthma Mother   . CVA Father   . CAD Father   . Stroke Father   .  Breast cancer Maternal Aunt 67  . COPD Sister     Social History Social History   Tobacco Use  . Smoking status: Current Every Day Smoker    Packs/day: 0.10    Years: 41.00    Pack years: 4.10    Types: Cigarettes  . Smokeless tobacco: Never Used  Substance Use Topics  . Alcohol use: No    Frequency: Never  . Drug use: Yes    Types: "Crack" cocaine    Comment: Past cocaine use.  Quit 04/2017    Review of Systems History is limited by the patient's clinical condition ____________________________________________   PHYSICAL EXAM:  VITAL SIGNS: ED Triage Vitals  Enc Vitals Group     BP      Pulse      Resp      Temp      Temp src      SpO2      Weight      Height      Head Circumference      Peak Flow      Pain Score      Pain Loc      Pain Edu?      Excl. in [place]?     Constitutional: In severe respiratory distress sitting up tripoding on CPAP speaking in one-word gasps Eyes: PERRL EOMI. Head: Atraumatic. Nose: No congestion/rhinnorhea. Mouth/Throat: No trismus Neck: No stridor.   Cardiovascular: Tachycardic rate, regular rhythm. Grossly normal heart sounds.  Good peripheral circulation. Respiratory: Severe respiratory distress using accessory muscles limited movement of air with mild wheezing Gastrointestinal: Soft nontender Musculoskeletal: No lower extremity edema   Neurologic: Moves all 4 Skin:  Skin is warm, dry and intact. No rash noted. Psychiatric: Anxious appearing   ____________________________________________   DIFFERENTIAL includes but not limited to  Pneumothorax, COPD exacerbation, pneumonia, pulmonary embolism ____________________________________________   LABS (all labs ordered are listed, but only abnormal results are displayed)  Labs Reviewed  TROPONIN I - Abnormal; Notable for the following components:      Result Value   Troponin I 0.04 (*)    All other components within normal limits  COMPREHENSIVE METABOLIC PANEL -  Abnormal; Notable for the following components:   Glucose, Bld 110 (*)    BUN 37 (*)    All other components within normal limits  CBC WITH DIFFERENTIAL/PLATELET - Abnormal; Notable for the following components:   WBC 20.0 (*)    MCHC 31.6 (*)    Neutro Abs 17.4 (*)    Lymphs Abs 0.7 (*)    Monocytes Absolute 1.7 (*)    All other components within normal limits  BLOOD GAS, ARTERIAL - Abnormal; Notable for the following components:   pH, Arterial 7.21 (*)    pCO2 arterial 68 (*)    pO2, Arterial 63 (*)    All other components within normal  limits  LACTIC ACID, PLASMA  BRAIN NATRIURETIC PEPTIDE  PROCALCITONIN  INFLUENZA PANEL BY PCR (TYPE A & B)  LACTIC ACID, PLASMA    Lab work reviewed by me shows elevated white count which could be consistent with infection versus steroid use __________________________________________  EKG   ____________________________________________  RADIOLOGY  First chest x-ray reviewed by me showing emphysematous changes Second chest x-ray reviewed by me shows ET tube in adequate position ____________________________________________   PROCEDURES  Procedure(s) performed: Yes  .Critical Care Performed by: Darel Hong, MD Authorized by: Darel Hong, MD   Critical care provider statement:    Critical care time (minutes):  45   Critical care time was exclusive of:  Separately billable procedures and treating other patients   Critical care was necessary to treat or prevent imminent or life-threatening deterioration of the following conditions:  Sepsis and respiratory failure   Critical care was time spent personally by me on the following activities:  Development of treatment plan with patient or surrogate, discussions with consultants, evaluation of patient's response to treatment, examination of patient, obtaining history from patient or surrogate, ordering and performing treatments and interventions, ordering and review of laboratory  studies, ordering and review of radiographic studies, pulse oximetry, re-evaluation of patient's condition and review of old charts Procedure Name: Intubation Date/Time: 09/30/2017 6:22 PM Performed by: Darel Hong, MD Pre-anesthesia Checklist: Patient identified, Emergency Drugs available, Suction available and Patient being monitored Oxygen Delivery Method: Non-rebreather mask Preoxygenation: Pre-oxygenation with 100% oxygen Induction Type: IV induction and Rapid sequence Laryngoscope Size: Mac and 4 Grade View: Grade I Tube size: 7.5 mm Number of attempts: 1 Placement Confirmation: ETT inserted through vocal cords under direct vision,  CO2 detector and Breath sounds checked- equal and bilateral Secured at: 22 cm Tube secured with: ETT holder Dental Injury: Teeth and Oropharynx as per pre-operative assessment     .Central Line Date/Time: 09/30/2017 6:23 PM Performed by: Darel Hong, MD Authorized by: Darel Hong, MD   Consent:    Consent obtained:  Emergent situation Pre-procedure details:    Hand hygiene: Hand hygiene performed prior to insertion     Sterile barrier technique: All elements of maximal sterile technique followed     Skin preparation:  2% chlorhexidine   Skin preparation agent: Skin preparation agent completely dried prior to procedure   Procedure details:    Location:  R femoral   Patient position:  Trendelenburg   Procedural supplies:  Triple lumen   Landmarks identified: yes     Ultrasound guidance: no     Number of attempts:  1   Successful placement: yes   Post-procedure details:    Post-procedure:  Dressing applied and line sutured   Assessment:  Blood return through all ports and free fluid flow   Patient tolerance of procedure:  Tolerated well, no immediate complications    Critical Care performed: Yes  Observation: no ____________________________________________   INITIAL IMPRESSION / ASSESSMENT AND PLAN / ED COURSE  Pertinent  labs & imaging results that were available during my care of the patient were reviewed by me and considered in my medical decision making (see chart for details).  The patient arrived critically ill-appearing after already being on CPAP for an hour.  She gotten 3 duo nebs and 3 albuterol treatments along with 4 g of magnesium.  The patient was still tripoding and moving very limited amounts of air.  Chest x-ray confirmed no pneumothorax.  I discussed endotracheal intubation with the patient and she said  that she did not think she could continue breathing like this for much longer and she requested intubation.  The patient was intubated with ketamine and succinylcholine without complication.  Following intubation and before sedation her blood pressure dropped to 70s over 50s so decision was made to place a sterile right femoral central line.  Placed without complication using landmarks.  She is now currently sedated with fentanyl and propofol along with norepinephrine drip and continuous albuterol treatments through the ventilator.  I appreciate that her subsequent ABG shows acidemia and hypercapnia however at this point I am more concerned for air trapping and I will allow her CO2 to ride high in an effort to not drop along.  Care signed over to the hospitalist who has graciously agreed to admit the patient to his service.  She remained critically ill at this time.      ____________________________________________   FINAL CLINICAL IMPRESSION(S) / ED DIAGNOSES  Final diagnoses:  Respiratory distress  COPD exacerbation ([place])  Shock ([place])      NEW MEDICATIONS STARTED DURING THIS VISIT:  New Prescriptions   No medications on file     Note:  This document was prepared using Dragon voice recognition software and may include unintentional dictation errors.     Darel Hong, MD 09/30/17 1827

## 2017-09-30 NOTE — ED Notes (Addendum)
[facility] CNA ii.  NO DIFFICULTY.  PERI CARE PRIOR TO INSERTION.

## 2017-09-30 NOTE — ED Notes (Signed)
TRIPLE LUMEN INSERTED BY DR [facility] RIGHT FEMORAL.  ONE BLOOD CULTURE DRAWN FROM THERE.

## 2017-09-30 NOTE — Consult Note (Signed)
PULMONARY / CRITICAL CARE MEDICINE   Name: Michelle [name] MRN: [id number] DOB: 04/23/1961    ADMISSION DATE:  09/30/2017 CONSULTATION DATE: 09/30/2017  REFERRING MD:  Dr. Anselm Jungling    CHIEF COMPLAINT: Shortness of Breath   HISTORY OF PRESENT ILLNESS:   This is a 57 yo female with a PMH Tobacco Abuse, Pneumothorax, HTN, Hypercholesteremia, Hepatitis C, Emphysema/COPD, Cocaine Abuse, CHF, Asthma, Anxiety, and Allergies.  She presented to [facility] ER via EMS on 04/29 with respiratory distress.  Upon EMS arrival at pts home she was in severe respiratory distress and immediately placed on CPAP.  She also received 3 duonebs, 3 albuterol treatments, and 4 mg magnesium en route to the ER.  However, in the ER pt remained in severe respiratory distress in tripod position requiring mechanical intubation.  CXR and influenza pcr negative. She was subsequently admitted to ICU by hospitalist team for further workup and treatment PCCM consulted for vent management.  PAST MEDICAL HISTORY :  She  has a past medical history of Allergy, Anxiety, Asthma, CHF (congestive heart failure) ([place]), Cocaine abuse ([place]), COPD (chronic obstructive pulmonary disease) ([place]), Coronary artery disease, Emphysema/COPD ([place]), Hepatitis C, High cholesterol, Hypertension, Pneumothorax, and Tobacco abuse.  PAST SURGICAL HISTORY: She  has a past surgical history that includes Cardiac catheterization and Chest tube insertion.  No Known Allergies  No current facility-administered medications on file prior to encounter.    Current Outpatient Medications on File Prior to Encounter  Medication Sig  . albuterol (PROVENTIL HFA;VENTOLIN HFA) 108 (90 Base) MCG/ACT inhaler Inhale 2 puffs into the lungs every 6 (six) hours as needed for wheezing or shortness of breath.  . ALPRAZolam (XANAX) 0.5 MG tablet Take 1 tablet (0.5 mg total) by mouth 3 (three) times daily as needed for anxiety.  Marland Kitchen aspirin EC 81 MG tablet Take 81 mg by mouth daily.  Marland Kitchen  ipratropium-albuterol (DUONEB) 0.5-2.5 (3) MG/3ML SOLN Take 3 mLs by nebulization every 6 (six) hours as needed.  . SYMBICORT 160-4.5 MCG/ACT inhaler Take 2 puffs by mouth 2 (two) times daily.  . carvedilol (COREG) 3.125 MG tablet Take 1 tablet (3.125 mg total) by mouth every morning. (Patient not taking: Reported on 09/30/2017)  . enalapril (VASOTEC) 10 MG tablet Take 0.5 tablets (5 mg total) by mouth daily. (Patient not taking: Reported on 09/30/2017)  . feeding supplement, ENSURE ENLIVE, (ENSURE ENLIVE) LIQD Take 237 mLs by mouth 2 (two) times daily between meals.  Marland Kitchen guaiFENesin-codeine 100-10 MG/5ML syrup Take 10 mLs by mouth every 4 (four) hours as needed for cough. (Patient not taking: Reported on 08/20/2017)  . levofloxacin (LEVAQUIN) 750 MG tablet Take 1 tablet (750 mg total) by mouth daily. (Patient not taking: Reported on 09/30/2017)  . polyethylene glycol (GOLYTELY) 236 g solution Drink one 8 oz glass every 20 mins until stools are clear starting at 5:00pm on 08/14/17 (Patient not taking: Reported on 09/30/2017)  . predniSONE (DELTASONE) 10 MG tablet Take 1 tablet (10 mg total) by mouth daily with breakfast. (Patient not taking: Reported on 09/30/2017)  . tiotropium (SPIRIVA) 18 MCG inhalation capsule Place 1 capsule (18 mcg total) into inhaler and inhale daily. (Patient not taking: Reported on 09/30/2017)    FAMILY HISTORY:  Her indicated that the status of her mother is unknown. She indicated that the status of her father is unknown. She indicated that the status of her sister is unknown. She indicated that her brother is deceased. She indicated that the status of her maternal aunt is unknown.  SOCIAL HISTORY: She  reports that she has been smoking cigarettes.  She has a 4.10 pack-year smoking history. She has never used smokeless tobacco. She reports that she has current or past drug history. Drug: "Crack" cocaine. She reports that she does not drink alcohol.  REVIEW OF SYSTEMS:   Unable  to assess pt intubated   SUBJECTIVE:  Unable to assess pt intubated   VITAL SIGNS: BP (!) 85/63   Pulse 87   Resp 16   Wt 45.7 kg (100 lb 12.8 oz)   LMP 03/06/2005 (Approximate)   SpO2 99%   BMI 19.69 kg/m   HEMODYNAMICS:    VENTILATOR SETTINGS: Vent Mode: AC FiO2 (%):  [40 %] 40 % Set Rate:  [16 bmp] 16 bmp Vt Set:  [450 mL] 450 mL PEEP:  [5 cmH20] 5 cmH20  INTAKE / OUTPUT: I/O last 3 completed shifts: In: 5 [I.V.:5] Out: -   PHYSICAL EXAMINATION: General: acutely ill female, NAD mechanically intubated  Neuro: sedated, not following commands, PERRL  HEENT: supple, no JVD Cardiovascular: nsr, rrr, no M/R/G Lungs: rhonchi and diminished throughout, even, non labored  Abdomen: +BS x4, soft, non distended  Musculoskeletal: normal bulk and tone, no edema  Skin: intact no rashes or lesions   LABS:  BMET Recent Labs  Lab 09/30/17 1705  NA 143  K 4.0  CL 103  CO2 31  BUN 37*  CREATININE 0.89  GLUCOSE 110*    Electrolytes Recent Labs  Lab 09/30/17 1705  CALCIUM 8.9    CBC Recent Labs  Lab 09/30/17 1705  WBC 20.0*  HGB 12.8  HCT 40.6  PLT 294    Coag's No results for input(s): APTT, INR in the last 168 hours.  Sepsis Markers Recent Labs  Lab 09/30/17 1705 09/30/17 1706  LATICACIDVEN  --  0.8  PROCALCITON 0.13  --     ABG Recent Labs  Lab 09/30/17 1751  PHART 7.21*  PCO2ART 68*  PO2ART 63*    Liver Enzymes Recent Labs  Lab 09/30/17 1705  AST 26  ALT 26  ALKPHOS 46  BILITOT 0.4  ALBUMIN 3.6    Cardiac Enzymes Recent Labs  Lab 09/30/17 1705  TROPONINI 0.04*    Glucose No results for input(s): GLUCAP in the last 168 hours.  Imaging Dg Chest Portable 1 View  Result Date: 09/30/2017 CLINICAL DATA:  Orogastric tube placement EXAM: PORTABLE CHEST 1 VIEW COMPARISON:  Earlier today FINDINGS: An orogastric tube tip reaches the stomach. The side port is near the GE junction. Endotracheal tube tip between the clavicular  heads and carina. Large lung volumes with lucent appearance. There is no edema, consolidation, effusion, or pneumothorax. Normal heart size and mediastinal contours. Artifact from EKG leads. IMPRESSION: The new orogastric tube is in good position. Otherwise stable from earlier today. Electronically Signed   By: Monte Fantasia M.D.   On: 09/30/2017 18:48   Dg Chest Port 1 View  Result Date: 09/30/2017 CLINICAL DATA:  Status post intubation. EXAM: PORTABLE CHEST 1 VIEW COMPARISON:  September 30, 2017 FINDINGS: The ETT is in good position. No pneumothorax. Tiny effusions with atelectasis. No other changes. Emphysema. IMPRESSION: 1. The ETT is in good position.  No other changes. Electronically Signed   By: Dorise Bullion III M.D   On: 09/30/2017 17:28   Dg Chest Port 1 View  Result Date: 09/30/2017 CLINICAL DATA:  Shortness of breath. EXAM: PORTABLE CHEST 1 VIEW COMPARISON:  CT scan August 23, 2017.  Chest x-ray  August 20, 2017. FINDINGS: Hyperexpansion of the lungs consistent with emphysema. Tiny pleural effusions and underlying atelectasis. No other interval changes. IMPRESSION: Emphysema.  Tiny pleural effusions and underlying atelectasis. Electronically Signed   By: Dorise Bullion III M.D   On: 09/30/2017 17:27   STUDIES:  None   CULTURES: Respiratory 04/29>> Urine 04/29>> Blood x2 04/29>>  ANTIBIOTICS: Levaquin 04/29>>  SIGNIFICANT EVENTS: 04/29-Pt admitted to ICU mechanically intubated   LINES/TUBES: ETT 04/29>> Right femoral CVL 04/29>>  ASSESSMENT / PLAN:  PULMONARY A: Acute on chronic hypercapnic hypoxic respiratory failure secondary to AECOPD  Mechanical Intubation  Hx: Asthma and Tobacco Abuse  P:   Full vent support-vent settings reviewed and established SBT once all parameters met Scheduled and prn bronchodilator therapy IV and nebulized steroids   VAP bundle implemented  CARDIOVASCULAR A:  Hypotension Elevated troponin likely secondary to demand ischemia in  setting in respiratory failure Hx: Hypercholesteremia, CAD, and CHF  P:  Prn levophed gtt to maintain map >65 NS _0  ml/hr Trend troponin's  Continuous telemetry monitoring   RENAL A:   No acute issues  P:   Trend BMP Replace electrolytes as indicated  Monitor UOP   GASTROINTESTINAL A:   No acute issues  P:   Keep NPO for now  SUB px: iv pepcid   HEMATOLOGIC A:   No acute issues  P:  VTE px: subq heparin  Trend CBC Monitor for s/sx of bleeding and transfuse for hgb <7  INFECTIOUS A:   Leukocytosis  P:   Trend WBC and monitor fever curve Trend PCT Follow cultures  Continue iv abx as listed above  ENDOCRINE A:   Hyperglycemia  P:   CBG's q4hrs  SSI   NEUROLOGIC A:   Mechanical Ventilator Discomfort/Pain Hx: Cocaine Abuse  P:   RASS goal: 0 to -1 Propofol gtt to maintain RASS goal  Prn fentanyl for pain management  WUA daily Urine drug screen pending    FAMILY  - Updates: No family currently at bedside to update at this time 09/30/2017  Marda Stalker, [place] Pager [phone] (please enter 7 digits) PCCM Consult Pager [phone] (please enter 7 digits)

## 2017-10-01 DIAGNOSIS — J9621 Acute and chronic respiratory failure with hypoxia: Secondary | ICD-10-CM

## 2017-10-01 DIAGNOSIS — J189 Pneumonia, unspecified organism: Secondary | ICD-10-CM

## 2017-10-01 DIAGNOSIS — J441 Chronic obstructive pulmonary disease with (acute) exacerbation: Secondary | ICD-10-CM

## 2017-10-01 LAB — CBC
HEMATOCRIT: 33.7 % — AB (ref 35.0–47.0)
Hemoglobin: 10.9 g/dL — ABNORMAL LOW (ref 12.0–16.0)
MCH: 29 pg (ref 26.0–34.0)
MCHC: 32.3 g/dL (ref 32.0–36.0)
MCV: 89.9 fL (ref 80.0–100.0)
Platelets: 243 10*3/uL (ref 150–440)
RBC: 3.74 MIL/uL — ABNORMAL LOW (ref 3.80–5.20)
RDW: 14.4 % (ref 11.5–14.5)
WBC: 15.2 10*3/uL — ABNORMAL HIGH (ref 3.6–11.0)

## 2017-10-01 LAB — BASIC METABOLIC PANEL
Anion gap: 6 (ref 5–15)
BUN: 40 mg/dL — ABNORMAL HIGH (ref 6–20)
CHLORIDE: 109 mmol/L (ref 101–111)
CO2: 27 mmol/L (ref 22–32)
Calcium: 7.8 mg/dL — ABNORMAL LOW (ref 8.9–10.3)
Creatinine, Ser: 1.31 mg/dL — ABNORMAL HIGH (ref 0.44–1.00)
GFR calc Af Amer: 52 mL/min — ABNORMAL LOW (ref >60)
GFR calc non Af Amer: 45 mL/min — ABNORMAL LOW (ref >60)
GLUCOSE: 115 mg/dL — AB (ref 65–99)
POTASSIUM: 4.2 mmol/L (ref 3.5–5.1)
Sodium: 142 mmol/L (ref 135–145)

## 2017-10-01 LAB — GLUCOSE, CAPILLARY
GLUCOSE-CAPILLARY: 106 mg/dL — AB (ref 65–99)
GLUCOSE-CAPILLARY: 128 mg/dL — AB (ref 65–99)
GLUCOSE-CAPILLARY: 151 mg/dL — AB (ref 65–99)
GLUCOSE-CAPILLARY: 89 mg/dL (ref 65–99)
GLUCOSE-CAPILLARY: 91 mg/dL (ref 65–99)
Glucose-Capillary: 101 mg/dL — ABNORMAL HIGH (ref 65–99)
Glucose-Capillary: 120 mg/dL — ABNORMAL HIGH (ref 65–99)

## 2017-10-01 LAB — TROPONIN I: Troponin I: 0.04 ng/mL (ref <0.03)

## 2017-10-01 MED ORDER — ASPIRIN 300 MG RE SUPP
300.0000 mg | Freq: Once | RECTAL | Status: AC
  Administered 2017-10-01: 300 mg via RECTAL
  Filled 2017-10-01: qty 1

## 2017-10-01 MED ORDER — SODIUM CHLORIDE 0.9 % IV BOLUS
1000.0000 mL | Freq: Once | INTRAVENOUS | Status: AC
  Administered 2017-10-01: 1000 mL via INTRAVENOUS

## 2017-10-01 MED ORDER — DEXMEDETOMIDINE HCL IN NACL 400 MCG/100ML IV SOLN
0.4000 ug/kg/h | INTRAVENOUS | Status: DC
  Administered 2017-10-01: 0.4 ug/kg/h via INTRAVENOUS
  Administered 2017-10-02: 1.2 ug/kg/h via INTRAVENOUS
  Administered 2017-10-02: 1 ug/kg/h via INTRAVENOUS
  Administered 2017-10-04: 0.4 ug/kg/h via INTRAVENOUS
  Administered 2017-10-04: 1 ug/kg/h via INTRAVENOUS
  Administered 2017-10-05: 1.5 ug/kg/h via INTRAVENOUS
  Administered 2017-10-05: 1.2 ug/kg/h via INTRAVENOUS
  Administered 2017-10-05: 1 ug/kg/h via INTRAVENOUS
  Administered 2017-10-05 – 2017-10-06 (×2): 2 ug/kg/h via INTRAVENOUS
  Administered 2017-10-06: 1.2 ug/kg/h via INTRAVENOUS
  Administered 2017-10-07: 2 ug/kg/h via INTRAVENOUS
  Administered 2017-10-07: 1 ug/kg/h via INTRAVENOUS
  Filled 2017-10-01 (×13): qty 100

## 2017-10-01 MED ORDER — ALPRAZOLAM 0.25 MG PO TABS
0.2500 mg | ORAL_TABLET | Freq: Three times a day (TID) | ORAL | Status: DC
  Administered 2017-10-01: 0.25 mg
  Filled 2017-10-01 (×2): qty 1

## 2017-10-01 MED ORDER — ASPIRIN EC 81 MG PO TBEC
81.0000 mg | DELAYED_RELEASE_TABLET | Freq: Every day | ORAL | Status: DC
  Administered 2017-10-02 – 2017-10-08 (×6): 81 mg via ORAL
  Filled 2017-10-01 (×7): qty 1

## 2017-10-01 MED ORDER — METOPROLOL TARTRATE 25 MG PO TABS
12.5000 mg | ORAL_TABLET | Freq: Two times a day (BID) | ORAL | Status: DC
  Administered 2017-10-01: 12.5 mg via ORAL
  Filled 2017-10-01: qty 1

## 2017-10-01 MED ORDER — LEVOFLOXACIN IN D5W 750 MG/150ML IV SOLN
750.0000 mg | INTRAVENOUS | Status: DC
  Administered 2017-10-01: 750 mg via INTRAVENOUS
  Filled 2017-10-01: qty 150

## 2017-10-01 MED ORDER — PIPERACILLIN-TAZOBACTAM 3.375 G IVPB
3.3750 g | Freq: Three times a day (TID) | INTRAVENOUS | Status: AC
  Administered 2017-10-01 – 2017-10-08 (×21): 3.375 g via INTRAVENOUS
  Filled 2017-10-01 (×20): qty 50

## 2017-10-01 MED ORDER — ALPRAZOLAM 0.5 MG PO TABS
0.5000 mg | ORAL_TABLET | Freq: Three times a day (TID) | ORAL | Status: DC
  Administered 2017-10-01 – 2017-10-03 (×9): 0.5 mg
  Filled 2017-10-01 (×9): qty 1

## 2017-10-01 MED ORDER — BUDESONIDE 0.5 MG/2ML IN SUSP
0.5000 mg | Freq: Two times a day (BID) | RESPIRATORY_TRACT | Status: DC
  Administered 2017-10-01 – 2017-12-23 (×164): 0.5 mg via RESPIRATORY_TRACT
  Filled 2017-10-01 (×166): qty 2

## 2017-10-01 NOTE — Progress Notes (Signed)
Name: Michelle [name] MRN: [id number] DOB: 12/13/1960     CONSULTATION DATE: 09/30/2017  Subjective & Objective: Remains on vent, off Levophed, on Propofol and anxious.  PAST MEDICAL HISTORY :   has a past medical history of Allergy, Anxiety, Asthma, CHF (congestive heart failure) ([place]), Cocaine abuse ([place]), COPD (chronic obstructive pulmonary disease) ([place]), Coronary artery disease, Emphysema/COPD ([place]), Hepatitis C, High cholesterol, Hypertension, Pneumothorax, and Tobacco abuse.  has a past surgical history that includes Cardiac catheterization and Chest tube insertion. Prior to Admission medications   Medication Sig Start Date End Date Taking? Authorizing Provider  albuterol (PROVENTIL HFA;VENTOLIN HFA) 108 (90 Base) MCG/ACT inhaler Inhale 2 puffs into the lungs every 6 (six) hours as needed for wheezing or shortness of breath. 02/26/17  Yes Dustin Flock, MD  ALPRAZolam Duanne Moron) 0.5 MG tablet Take 1 tablet (0.5 mg total) by mouth 3 (three) times daily as needed for anxiety. 08/24/17  Yes Bettey Costa, MD  aspirin EC 81 MG tablet Take 81 mg by mouth daily.   Yes [provider]  ipratropium-albuterol (DUONEB) 0.5-2.5 (3) MG/3ML SOLN Take 3 mLs by nebulization every 6 (six) hours as needed. 01/04/17  Yes [name], MD  SYMBICORT 160-4.5 MCG/ACT inhaler Take 2 puffs by mouth 2 (two) times daily.   Yes [provider]  carvedilol (COREG) 3.125 MG tablet Take 1 tablet (3.125 mg total) by mouth every morning. Patient not taking: Reported on 09/30/2017 02/10/16   Gladstone Lighter, MD  enalapril (VASOTEC) 10 MG tablet Take 0.5 tablets (5 mg total) by mouth daily. Patient not taking: Reported on 09/30/2017 02/10/16   Gladstone Lighter, MD  feeding supplement, ENSURE ENLIVE, (ENSURE ENLIVE) LIQD Take 237 mLs by mouth 2 (two) times daily between meals. 08/24/17   Bettey Costa, MD  guaiFENesin-codeine 100-10 MG/5ML syrup Take 10 mLs by mouth every 4 (four) hours as needed for  cough. Patient not taking: Reported on 08/20/2017 07/02/17   Demetrios Loll, MD  levofloxacin (LEVAQUIN) 750 MG tablet Take 1 tablet (750 mg total) by mouth daily. Patient not taking: Reported on 09/30/2017 08/24/17   Bettey Costa, MD  polyethylene glycol (GOLYTELY) 236 g solution Drink one 8 oz glass every 20 mins until stools are clear starting at 5:00pm on 08/14/17 Patient not taking: Reported on 09/30/2017 07/26/17   Lucilla Lame, MD  predniSONE (DELTASONE) 10 MG tablet Take 1 tablet (10 mg total) by mouth daily with breakfast. Patient not taking: Reported on 09/30/2017 08/24/17   Bettey Costa, MD  tiotropium (SPIRIVA) 18 MCG inhalation capsule Place 1 capsule (18 mcg total) into inhaler and inhale daily. Patient not taking: Reported on 09/30/2017 01/04/17   [name], MD   No Known Allergies  FAMILY HISTORY:  family history includes Asthma in her mother; Breast cancer (age of onset: 50) in her maternal aunt; CAD in her father; COPD in her sister; CVA in her father; Lung cancer in her mother; Stroke in her father. SOCIAL HISTORY:  reports that she has been smoking cigarettes.  She has a 4.10 pack-year smoking history. She has never used smokeless tobacco. She reports that she has current or past drug history. Drug: "Crack" cocaine. She reports that she does not drink alcohol.  REVIEW OF SYSTEMS:   Unable to obtain due to critical illness   VITAL SIGNS: Temp:  [98.5 F (36.9 C)-100 F (37.8 C)] 100 F (37.8 C) (04/30 0400) Pulse Rate:  [78-113] 78 (04/30 0600) Resp:  [14-24] 16 (04/30 0600) BP: (72-125)/(55-87) 125/79 (04/30  0600) SpO2:  [95 %-100 %] 95 % (04/30 0641) FiO2 (%):  [28 %-40 %] 28 % (04/30 0717) Weight:  [45.7 kg (100 lb 12.8 oz)-45.8 kg (100 lb 15.5 oz)] 45.8 kg (100 lb 15.5 oz) (04/29 1946)  Physical Examination:  RASS of -1, anxious and non focal neuro exam On vent, moderate distress on CPAP/PS, poor air movement bil.  S1 & S2 audible and no murmur Benign abdomen  with normal peristalses Ext: wasted and no edema  ASSESSMENT / PLAN: Acute on chronic respiratory failure with base line home O2 2 L/min and steroid dependent (Prednisone 10 mg PO daily)  COPD exacerbation -Bronchodilators + inhaled steroids + ABX + tapering systemic steroids  Pneumonia (Sput GPC and GNR) -Start on empiric Zosyn. D/c Levoflox and follow with culture results -Monitor CXR + CBC + FIO2.  AKI due to renal hypoperfusion with hypotension -Avoid nephrotoxins, monitor renal panel and urine out put.  Cocaine intoxication -Supportive care  Hypotension secondary to meds (resolved). Off Levophed -Monitor hemodynamics  Elevated CI with demand vs supply mismatch. H/o CAD, ECHO 2017 LV EF 60-65% -Monitor EKG, ECHO and CI. -ASA + BB as tolerated and consider cardiology consult.  HTN -Optimize antihypertensives and monitor hemodynamics. Dyslipidemia  Anxiety -Xanax  Full code  DVT & GI prophylaxis. Continue with supportive care  Critical care time 40 min

## 2017-10-01 NOTE — Progress Notes (Signed)
[place] [place] NAME: Michelle [name]    MR#:  [id number]  DATE OF BIRTH:  August 18, 1960  SUBJECTIVE:  even with respiratory distress patient intubated on the ventilator. She is awake trying to talk with sign language in writing.  REVIEW OF SYSTEMS:   Review of Systems  Unable to perform ROS: Intubated   Tolerating Diet:TF   DRUG ALLERGIES:  No Known Allergies  VITALS:  Blood pressure (!) 74/54, pulse 71, temperature 100 F (37.8 C), temperature source Bladder, resp. rate 19, height 5\' 5"  (1.651 m), weight 45.8 kg (100 lb 15.5 oz), last menstrual period 03/06/2005, SpO2 98 %.  PHYSICAL EXAMINATION:   Physical Exam  GENERAL:  57 y.o.-year-old patient lying in the bed with no acute distress. On the vent EYES: Pupils equal, round, reactive to light and accommodation. No scleral icterus. Extraocular muscles intact.  HEENT: Head atraumatic, normocephalic. Oropharynx and nasopharynx clear.  NECK:  Supple, no jugular venous distention. No thyroid enlargement, no tenderness.  LUNGS: Normal breath sounds bilaterally, no wheezing, rales, rhonchi. No use of accessory muscles of respiration.  CARDIOVASCULAR: S1, S2 normal. No murmurs, rubs, or gallops.  ABDOMEN: Soft, nontender, nondistended. Bowel sounds present. No organomegaly or mass.  EXTREMITIES: No cyanosis, clubbing or edema b/l.    NEUROLOGIC: Cranial nerves II through XII are intact. No focal Motor or sensory deficits b/l.   PSYCHIATRIC:  patient is alert and oriented x 3.  SKIN: No obvious rash, lesion, or ulcer.   LABORATORY PANEL:  CBC Recent Labs  Lab 10/01/17 0417  WBC 15.2*  HGB 10.9*  HCT 33.7*  PLT 243    Chemistries  Recent Labs  Lab 09/30/17 1705  10/01/17 0417  NA 143  --  142  K 4.0  --  4.2  CL 103  --  109  CO2 31  --  27  GLUCOSE 110*  --  115*  BUN 37*  --  40*  CREATININE 0.89   < > 1.31*  CALCIUM 8.9  --  7.8*  AST 26  --   --   ALT 26  --    --   ALKPHOS 46  --   --   BILITOT 0.4  --   --    < > = values in this interval not displayed.   Cardiac Enzymes Recent Labs  Lab 10/01/17 0417  TROPONINI 0.04*   RADIOLOGY:  Dg Chest Portable 1 View  Result Date: 09/30/2017 CLINICAL DATA:  Orogastric tube placement EXAM: PORTABLE CHEST 1 VIEW COMPARISON:  Earlier today FINDINGS: An orogastric tube tip reaches the stomach. The side port is near the GE junction. Endotracheal tube tip between the clavicular heads and carina. Large lung volumes with lucent appearance. There is no edema, consolidation, effusion, or pneumothorax. Normal heart size and mediastinal contours. Artifact from EKG leads. IMPRESSION: The new orogastric tube is in good position. Otherwise stable from earlier today. Electronically Signed   By: Monte Fantasia M.D.   On: 09/30/2017 18:48   Dg Chest Port 1 View  Result Date: 09/30/2017 CLINICAL DATA:  Status post intubation. EXAM: PORTABLE CHEST 1 VIEW COMPARISON:  September 30, 2017 FINDINGS: The ETT is in good position. No pneumothorax. Tiny effusions with atelectasis. No other changes. Emphysema. IMPRESSION: 1. The ETT is in good position.  No other changes. Electronically Signed   By: Dorise Bullion III M.D   On: 09/30/2017 17:28   Dg Chest Port 1 [address]  Result Date: 09/30/2017 CLINICAL DATA:  Shortness of breath. EXAM: PORTABLE CHEST 1 VIEW COMPARISON:  CT scan August 23, 2017.  Chest x-ray August 20, 2017. FINDINGS: Hyperexpansion of the lungs consistent with emphysema. Tiny pleural effusions and underlying atelectasis. No other interval changes. IMPRESSION: Emphysema.  Tiny pleural effusions and underlying atelectasis. Electronically Signed   By: Dorise Bullion III M.D   On: 09/30/2017 17:27   ASSESSMENT AND PLAN:   Michelle [name]  is a 57 y.o. female with a known history of anxiety, CHF, cocaine abuse, COPD, coronary artery disease, hepatitis C, hypertension- as severely short of breath at home and so called EMS. On the  way EMT gave her 5-6 breathing treatments but she was still extremely short of breath in the emergency room on arrival, so ER physician intubated her  * acute on chronic respiratory failure with hypoxia secondary to acute on chronic COPD exacerbation  -  IV steroids and nebulizer therapy -  Levaquin to treat bronchitis. -  Continue ventilatory support and intensivist to help with further management.  * hypotension   -Could be due to respiratory failure and intubation  - Lactic acid is normal.   -Continue IV fluids and monitor.  - Currently on norepinephrine drip.  * history of hypertension   -Hold medications for now.  * elevated white blood cell count -  Likely secondary to bronchitis or infection, continue monitoring. -20.4--15.2 -BC neg  * UDS positive for cocaine   Case discussed with Care Management/Social Worker. Management plans discussed with the patient, family and they are in agreement.  CODE STATUS: FULL  DVT Prophylaxis: heparin  TOTAL TIME TAKING CARE OF THIS PATIENT: *30* minutes.  >50% time spent on counselling and coordination of care  POSSIBLE D/C IN *few* DAYS, DEPENDING ON CLINICAL CONDITION.  Note: This dictation was prepared with Dragon dictation along with smaller phrase technology. Any transcriptional errors that result from this process are unintentional.  Michelle [name] M.D on 10/01/2017 at 2:36 PM  Between 7am to 6pm - Pager - [phone]  After 6pm go to [email or web address] - password [email or web address] [facility]  Office  [phone]  CC: Primary care physician; [facility] HealthPatient ID: Michelle [name], female   DOB: Sep 07, 1960, 57 y.o.   MRN: [id number]

## 2017-10-01 NOTE — Progress Notes (Signed)
ANTIBIOTIC CONSULT NOTE - INITIAL  Pharmacy Consult for Levaquin  Indication: HCAP  Allergies  Allergen Reactions  . Carvedilol Other (See Comments)    Pt gets "sick" when on medication    Patient Measurements: Height: 4' 9"  (144.8 cm) Weight: 100 lb 15.5 oz (45.8 kg) IBW/kg (Calculated) : 38.6 Adjusted Body Weight:   Vital Signs: Temp: 98.1 F (36.7 C) (04/30 2000) Temp Source: Bladder (04/30 2000) BP: 114/91 (04/30 2000) Pulse Rate: 73 (04/30 1900) Intake/Output from previous day: 04/29 0701 - 04/30 0700 In: 1094 [I.V.:1044; IV Piggyback:50] Out: 350 [Urine:350] Intake/Output from this shift: Total I/O In: -  Out: 50 [Urine:50]  Labs: Recent Labs    09/30/17 1705 09/30/17 2233 10/01/17 0417  WBC 20.0* 20.4* 15.2*  HGB 12.8 11.5* 10.9*  PLT 294 283 243  CREATININE 0.89 1.30* 1.31*   Estimated Creatinine Clearance: 29.2 mL/min (A) (by C-G formula based on SCr of 1.31 mg/dL (H)). No results for input(s): VANCOTROUGH, VANCOPEAK, VANCORANDOM, GENTTROUGH, GENTPEAK, GENTRANDOM, TOBRATROUGH, TOBRAPEAK, TOBRARND, AMIKACINPEAK, AMIKACINTROU, AMIKACIN in the last 72 hours.   Microbiology: Recent Results (from the past 720 hour(s))  CULTURE, BLOOD (ROUTINE X 2) w Reflex to ID Panel     Status: None (Preliminary result)   Collection Time: 09/30/17  5:43 PM  Result Value Ref Range Status   Specimen Description BLOOD CENTRAL LINE  Final   Special Requests   Final    BOTTLES DRAWN AEROBIC AND ANAEROBIC Blood Culture adequate volume   Culture   Final    NO GROWTH < 24 HOURS Performed at [facility], [address], [place], [place] [address]    Report Status PENDING  Incomplete  MRSA PCR Screening     Status: None   Collection Time: 09/30/17  8:02 PM  Result Value Ref Range Status   MRSA by PCR NEGATIVE NEGATIVE Final    Comment:        The GeneXpert MRSA Assay (FDA approved for NASAL specimens only), is one component of a comprehensive MRSA  colonization surveillance program. It is not intended to diagnose MRSA infection nor to guide or monitor treatment for MRSA infections. Performed at [facility], [address] [place], [place] [address]   CULTURE, BLOOD (ROUTINE X 2) w Reflex to ID Panel     Status: None (Preliminary result)   Collection Time: 09/30/17  8:16 PM  Result Value Ref Range Status   Specimen Description BLOOD L HAND  Final   Special Requests   Final    BOTTLES DRAWN AEROBIC AND ANAEROBIC Blood Culture results may not be optimal due to an inadequate volume of blood received in culture bottles   Culture   Final    NO GROWTH < 24 HOURS Performed at [facility], [address] [place], [place] [address]    Report Status PENDING  Incomplete  Culture, respiratory (NON-Expectorated)     Status: None (Preliminary result)   Collection Time: 09/30/17  9:07 PM  Result Value Ref Range Status   Specimen Description   Final    TRACHEAL ASPIRATE Performed at [facility], [address], [place], [place] [address]    Special Requests   Final    NONE Performed at [facility], [address] [place], [place] [address]    Gram Stain   Final    MODERATE WBC PRESENT, PREDOMINANTLY PMN FEW GRAM POSITIVE COCCI IN PAIRS IN CLUSTERS FEW GRAM NEGATIVE RODS Performed at [facility], [address] [place], [place] [address]    Culture PENDING  Incomplete   Report Status PENDING  Incomplete    Medical History: Past Medical History:  Diagnosis Date  . Allergy   . Anxiety   . Asthma   . CHF (congestive heart failure) ([place])   . Cocaine abuse ([place])   . COPD (chronic obstructive pulmonary disease) ([place])   . Coronary artery disease   . Emphysema/COPD ([place])   . Hepatitis C   . High cholesterol   . Hypertension   . Pneumothorax   . Tobacco abuse     Medications:  Scheduled:  . ALPRAZolam  0.5 mg Per Tube TID  . [START ON 10/02/2017] aspirin EC  81 mg Oral Daily   . budesonide (PULMICORT) nebulizer solution  0.5 mg Nebulization BID  . chlorhexidine gluconate (MEDLINE KIT)  15 mL Mouth Rinse BID  . heparin  5,000 Units Subcutaneous Q8H  . insulin aspart  0-9 Units Subcutaneous Q4H  . ipratropium-albuterol  3 mL Nebulization Q4H  . mouth rinse  15 mL Mouth Rinse 10 times per day  . methylPREDNISolone (SOLU-MEDROL) injection  60 mg Intravenous Q8H   Assessment: CrCl = 29.2 ml/min   Goal of Therapy:  resolution of infection  Plan:  Expected duration 7 days with resolution of temperature and/or normalization of WBC   Levaquin 750 mg IV Q48H ordered to start on 4/30 @ 22:00.   Michelle [name] D 10/01/2017,9:51 PM

## 2017-10-01 NOTE — Progress Notes (Signed)
Initial Nutrition Assessment  DOCUMENTATION CODES:   Non-severe (moderate) malnutrition in context of chronic illness  INTERVENTION:  If patient remains intubated >24-48 hours recommend initiating Vital AF 1.2 at 40 mL/hr (960 mL goal daily volume) via OGT. Provides 1152 kcal, 72 grams of protein, 778 mL H2O daily. With current propofol rate provides 1334 kcal daily.  Provide liquid MVI daily per tube if tube feeds are initiated.  NUTRITION DIAGNOSIS:   Moderate Malnutrition related to chronic illness(COPD, CHF, hepatitis C, cocaine abuse) as evidenced by mild fat depletion, moderate fat depletion, mild muscle depletion, moderate muscle depletion.  GOAL:   Provide needs based on ASPEN/SCCM guidelines  MONITOR:   Vent status, Labs, Weight trends, TF tolerance, I & O's  REASON FOR ASSESSMENT:   Ventilator    ASSESSMENT:   57 year old female with PMHx of emphysema, COPD, HTN, hypercholesterolemia, hx pneumothorax s/p chest tube insertion, CAD s/p cardiac catheterization, cocaine abuse, tobacco abuse, asthma, CHF, anxiety, hepatitis C admitted with acute exacerbation of COPD, acute on chronic respiratory failure requiring intubation 4/29, PNA, cocaine intoxication, AKI due to renal hypoperfusion.   -Urine toxicology positive for cocaine.  Patient intubated and sedated. She opened her eyes during RD assessment and was able to answer some yes and no questions. Patient well-known to this RD from previous admissions. She previously met the criteria for severe chronic malnutrition, but has been working hard to gain her weight back. RD noted height in chart was different than it has previously been. Patient confirms she is 4' 9"  and not 5' 5"  - will update in chart. Patient was 80 lbs in 05/2016 and she has since gained back about 20 lbs. NFPE continues to improve.  Access: 14 Fr. OGT placed 4/29; terminates in stomach per chest x-ray 4/29; 55 cm at corner of mouth  MAP: 59-92 mmHg except  for one reading of 53 mmHg at 0800, which was shortly after propofol rate was increased to 13.7 mL/hr (has since been decreased)  Patient is currently intubated on ventilator support Ve: 7.4 L/min Temp (24hrs), Avg:99.2 F (37.3 C), Min:98.2 F (36.8 C), Max:100 F (37.8 C)  Propofol: 6.9 ml/hr (182 kcal daily)  Medications reviewed and include: Xanax 0.5 mg TID, Novolog 0-9 units Q4hrs, Solu-Medrol 60 mg Q8hrs IV, NS @ 75 mL/hr, famotidine, Levophed gtt at 4 mcg/min, Zosyn, propofol gtt, NS bolus IV once today.  Labs reviewed: CBG 101-194, BUN 40 (trending up), Creatinine 1.31 (trending up), eGFR 52 (trending down).  I/O: 350 mL UOP overnight  Discussed with RN and on rounds. Possible extubation within 24-48 hours.  NUTRITION - FOCUSED PHYSICAL EXAM:    Most Recent Value  Orbital Region  No depletion  Upper Arm Region  Moderate depletion  Thoracic and Lumbar Region  Mild depletion  Buccal Region  No depletion  Temple Region  Mild depletion  Clavicle Bone Region  No depletion  Clavicle and Acromion Bone Region  No depletion  Scapular Bone Region  No depletion  Dorsal Hand  Mild depletion  Patellar Region  Moderate depletion  Anterior Thigh Region  Moderate depletion  Posterior Calf Region  Moderate depletion  Edema (RD Assessment)  None  Hair  Reviewed  Eyes  Unable to assess  Mouth  Unable to assess  Skin  Reviewed  Nails  Reviewed     Diet Order:   Diet Order           Diet NPO time specified  Diet effective now  EDUCATION NEEDS:   No education needs have been identified at this time  Skin:  Skin Assessment: Reviewed RN Assessment  Last BM:  Unknown  Height:   Ht Readings from Last 1 Encounters:  10/01/17 4' 9"  (1.448 m)    Weight:   Wt Readings from Last 1 Encounters:  09/30/17 100 lb 15.5 oz (45.8 kg)    Ideal Body Weight:  43.2 kg  BMI:  Body mass index is 21.85 kg/m.  Estimated Nutritional Needs:   Kcal:  1341 (PSU 2003b w/  MSJ 1053, Ve 7.4, Tmax 37.8)  Protein:  70-80 grams (1.5-1.8 grams/kg)  Fluid:  1.4-1.6 L/day (30-35 mL/kg)  Willey Blade, MS, RD, LDN Office: [phone] Pager: [phone] After Hours/Weekend Pager: [phone]

## 2017-10-02 ENCOUNTER — Inpatient Hospital Stay: Payer: Medicaid Other

## 2017-10-02 LAB — CBC WITH DIFFERENTIAL/PLATELET
BASOS PCT: 0 %
Basophils Absolute: 0 10*3/uL (ref 0–0.1)
EOS ABS: 0 10*3/uL (ref 0–0.7)
Eosinophils Relative: 0 %
HCT: 32.5 % — ABNORMAL LOW (ref 35.0–47.0)
HEMOGLOBIN: 10.5 g/dL — AB (ref 12.0–16.0)
Lymphocytes Relative: 2 %
Lymphs Abs: 0.2 10*3/uL — ABNORMAL LOW (ref 1.0–3.6)
MCH: 29.1 pg (ref 26.0–34.0)
MCHC: 32.4 g/dL (ref 32.0–36.0)
MCV: 89.7 fL (ref 80.0–100.0)
Monocytes Absolute: 0.5 10*3/uL (ref 0.2–0.9)
Monocytes Relative: 6 %
NEUTROS PCT: 92 %
Neutro Abs: 7.6 10*3/uL — ABNORMAL HIGH (ref 1.4–6.5)
Platelets: 204 10*3/uL (ref 150–440)
RBC: 3.62 MIL/uL — AB (ref 3.80–5.20)
RDW: 15 % — ABNORMAL HIGH (ref 11.5–14.5)
WBC: 8.2 10*3/uL (ref 3.6–11.0)

## 2017-10-02 LAB — PROCALCITONIN: Procalcitonin: 1.53 ng/mL

## 2017-10-02 LAB — URINE CULTURE: Culture: NO GROWTH

## 2017-10-02 LAB — BASIC METABOLIC PANEL
ANION GAP: 6 (ref 5–15)
BUN: 37 mg/dL — ABNORMAL HIGH (ref 6–20)
CO2: 24 mmol/L (ref 22–32)
Calcium: 8.2 mg/dL — ABNORMAL LOW (ref 8.9–10.3)
Chloride: 109 mmol/L (ref 101–111)
Creatinine, Ser: 1.04 mg/dL — ABNORMAL HIGH (ref 0.44–1.00)
GFR calc Af Amer: >60 mL/min (ref >60)
GFR, EST NON AFRICAN AMERICAN: 59 mL/min — AB (ref >60)
GLUCOSE: 127 mg/dL — AB (ref 65–99)
POTASSIUM: 4.3 mmol/L (ref 3.5–5.1)
SODIUM: 139 mmol/L (ref 135–145)

## 2017-10-02 LAB — GLUCOSE, CAPILLARY
GLUCOSE-CAPILLARY: 143 mg/dL — AB (ref 65–99)
GLUCOSE-CAPILLARY: 167 mg/dL — AB (ref 65–99)
Glucose-Capillary: 124 mg/dL — ABNORMAL HIGH (ref 65–99)
Glucose-Capillary: 117 mg/dL — ABNORMAL HIGH (ref 65–99)
Glucose-Capillary: 147 mg/dL — ABNORMAL HIGH (ref 65–99)

## 2017-10-02 LAB — MAGNESIUM: MAGNESIUM: 2.3 mg/dL (ref 1.7–2.4)

## 2017-10-02 LAB — PHOSPHORUS: PHOSPHORUS: 3.4 mg/dL (ref 2.5–4.6)

## 2017-10-02 MED ORDER — FENTANYL 2500MCG IN NS 250ML (10MCG/ML) PREMIX INFUSION
INTRAVENOUS | Status: AC
  Administered 2017-10-02: 100 ug/h via INTRAVENOUS
  Filled 2017-10-02: qty 250

## 2017-10-02 MED ORDER — FENTANYL 2500MCG IN NS 250ML (10MCG/ML) PREMIX INFUSION
0.0000 ug/h | INTRAVENOUS | Status: AC
  Administered 2017-10-02: 100 ug/h via INTRAVENOUS
  Administered 2017-10-03: 200 ug/h via INTRAVENOUS
  Administered 2017-10-04: 150 ug/h via INTRAVENOUS
  Administered 2017-10-04: 100 ug/h via INTRAVENOUS
  Administered 2017-10-05: 150 ug/h via INTRAVENOUS
  Administered 2017-10-06: 200 ug/h via INTRAVENOUS
  Administered 2017-10-06: 100 ug/h via INTRAVENOUS
  Administered 2017-10-07 (×2): 400 ug/h via INTRAVENOUS
  Administered 2017-10-07: 200 ug/h via INTRAVENOUS
  Administered 2017-10-08 – 2017-10-10 (×6): 400 ug/h via INTRAVENOUS
  Administered 2017-10-10: 250 ug/h via INTRAVENOUS
  Administered 2017-10-11: 400 ug/h via INTRAVENOUS
  Administered 2017-10-11: 200 ug/h via INTRAVENOUS
  Administered 2017-10-11 – 2017-10-12 (×3): 400 ug/h via INTRAVENOUS
  Administered 2017-10-12 (×2): 300 ug/h via INTRAVENOUS
  Administered 2017-10-13: 250 ug/h via INTRAVENOUS
  Administered 2017-10-13: 275 ug/h via INTRAVENOUS
  Administered 2017-10-14 (×2): 300 ug/h via INTRAVENOUS
  Administered 2017-10-14: 400 ug/h via INTRAVENOUS
  Administered 2017-10-14: 350 ug/h via INTRAVENOUS
  Administered 2017-10-15: 250 ug/h via INTRAVENOUS
  Administered 2017-10-16: 200 ug/h via INTRAVENOUS
  Administered 2017-10-16 (×2): 250 ug/h via INTRAVENOUS
  Administered 2017-10-17: 200 ug/h via INTRAVENOUS
  Administered 2017-10-17: 250 ug/h via INTRAVENOUS
  Filled 2017-10-02 (×42): qty 250

## 2017-10-02 MED ORDER — VITAL AF 1.2 CAL PO LIQD
1000.0000 mL | ORAL | Status: DC
Start: 2017-10-02 — End: 2017-10-04
  Administered 2017-10-02 – 2017-10-03 (×3): 1000 mL

## 2017-10-02 MED ORDER — RISPERIDONE 1 MG/ML PO SOLN
0.5000 mg | Freq: Two times a day (BID) | ORAL | Status: DC
  Administered 2017-10-02 – 2017-10-03 (×3): 0.5 mg via ORAL
  Filled 2017-10-02 (×5): qty 0.5

## 2017-10-02 MED ORDER — SODIUM CHLORIDE 0.9% FLUSH
10.0000 mL | INTRAVENOUS | Status: DC | PRN
  Administered 2017-10-02 – 2017-11-04 (×4): 10 mL
  Filled 2017-10-02 (×4): qty 40

## 2017-10-02 MED ORDER — ADULT MULTIVITAMIN LIQUID CH
15.0000 mL | Freq: Every day | ORAL | Status: DC
  Administered 2017-10-02 – 2017-12-31 (×89): 15 mL
  Filled 2017-10-02 (×93): qty 15

## 2017-10-02 MED ORDER — PROPOFOL 1000 MG/100ML IV EMUL
INTRAVENOUS | Status: AC
  Administered 2017-10-02: 40 ug/kg/min via INTRAVENOUS
  Filled 2017-10-02: qty 100

## 2017-10-02 MED ORDER — VITAL HIGH PROTEIN PO LIQD: 1000.0000 mL | ORAL | Status: DC

## 2017-10-02 MED ORDER — PROPOFOL 1000 MG/100ML IV EMUL
5.0000 ug/kg/min | INTRAVENOUS | Status: DC
  Administered 2017-10-02: 40 ug/kg/min via INTRAVENOUS
  Administered 2017-10-03 (×2): 20 ug/kg/min via INTRAVENOUS
  Administered 2017-10-04: 40 ug/kg/min via INTRAVENOUS
  Filled 2017-10-02 (×3): qty 100

## 2017-10-02 NOTE — Progress Notes (Signed)
   10/02/17 2300  Vitals  BP 101/73  MAP (mmHg) 81  Pulse Rate 84  ECG Heart Rate 81  Resp 19  Oxygen Therapy  SpO2 94 %  O2 Device Ventilator  End [place] CO2 (EtCO2) 50   Pt sent peak pressuring ventilator and agitation. Increased fentanyl gtt to 216mcg/hr.

## 2017-10-02 NOTE — Progress Notes (Signed)
Pt continues to have anxiety and "difficulty breathing" on vent.  Hinton Dyer, NP at bedside.  Orders received.

## 2017-10-02 NOTE — Progress Notes (Signed)
Name: Michelle [name] MRN: [id number] DOB: March 07, 1961     CONSULTATION DATE: 09/30/2017  She remains anxious when awake,On Precedx + Propofol for sedation  PAST MEDICAL HISTORY :   has a past medical history of Allergy, Anxiety, Asthma, CHF (congestive heart failure) ([place]), Cocaine abuse ([place]), COPD (chronic obstructive pulmonary disease) ([place]), Coronary artery disease, Emphysema/COPD ([place]), Hepatitis C, High cholesterol, Hypertension, Pneumothorax, and Tobacco abuse.  has a past surgical history that includes Cardiac catheterization and Chest tube insertion. Prior to Admission medications   Medication Sig Start Date End Date Taking? Authorizing Provider  albuterol (PROVENTIL HFA;VENTOLIN HFA) 108 (90 Base) MCG/ACT inhaler Inhale 2 puffs into the lungs every 6 (six) hours as needed for wheezing or shortness of breath. 02/26/17  Yes Dustin Flock, MD  ALPRAZolam Duanne Moron) 0.5 MG tablet Take 1 tablet (0.5 mg total) by mouth 3 (three) times daily as needed for anxiety. 08/24/17  Yes Bettey Costa, MD  aspirin EC 81 MG tablet Take 81 mg by mouth daily.   Yes [provider]  ipratropium-albuterol (DUONEB) 0.5-2.5 (3) MG/3ML SOLN Take 3 mLs by nebulization every 6 (six) hours as needed. 01/04/17  Yes [name], MD  mirtazapine (REMERON) 30 MG tablet Take 30 mg by mouth at bedtime.   Yes [provider]  predniSONE (DELTASONE) 10 MG tablet Take 1 tablet (10 mg total) by mouth daily with breakfast. 08/24/17  Yes Mody, Sital, MD  SYMBICORT 160-4.5 MCG/ACT inhaler Take 2 puffs by mouth 2 (two) times daily.   Yes [provider]  tiotropium (SPIRIVA) 18 MCG inhalation capsule Place 1 capsule (18 mcg total) into inhaler and inhale daily. 01/04/17  Yes [name], MD  carvedilol (COREG) 3.125 MG tablet Take 1 tablet (3.125 mg total) by mouth every morning. Patient not taking: Reported on 09/30/2017 02/10/16   Gladstone Lighter, MD  enalapril (VASOTEC) 10 MG tablet  Take 0.5 tablets (5 mg total) by mouth daily. Patient not taking: Reported on 09/30/2017 02/10/16   Gladstone Lighter, MD  feeding supplement, ENSURE ENLIVE, (ENSURE ENLIVE) LIQD Take 237 mLs by mouth 2 (two) times daily between meals. 08/24/17   Bettey Costa, MD  guaiFENesin-codeine 100-10 MG/5ML syrup Take 10 mLs by mouth every 4 (four) hours as needed for cough. Patient not taking: Reported on 08/20/2017 07/02/17   Demetrios Loll, MD  levofloxacin (LEVAQUIN) 750 MG tablet Take 1 tablet (750 mg total) by mouth daily. Patient not taking: Reported on 09/30/2017 08/24/17   Bettey Costa, MD  polyethylene glycol (GOLYTELY) 236 g solution Drink one 8 oz glass every 20 mins until stools are clear starting at 5:00pm on 08/14/17 Patient not taking: Reported on 09/30/2017 07/26/17   Lucilla Lame, MD   Allergies  Allergen Reactions  . Carvedilol Other (See Comments)    Pt gets "sick" when on medication    FAMILY HISTORY:  family history includes Asthma in her mother; Breast cancer (age of onset: 31) in her maternal aunt; CAD in her father; COPD in her sister; CVA in her father; Lung cancer in her mother; Stroke in her father. SOCIAL HISTORY:  reports that she has been smoking cigarettes.  She has a 4.10 pack-year smoking history. She has never used smokeless tobacco. She reports that she has current or past drug history. Drug: "Crack" cocaine. She reports that she does not drink alcohol.  REVIEW OF SYSTEMS:   Unable to obtain due to critical illness   VITAL SIGNS: Temp:  [97.2 F (36.2 C)-99.3 F (37.4 C)]  99.3 F (37.4 C) (05/01 1130) Pulse Rate:  [59-86] 80 (05/01 1230) Resp:  [9-38] 15 (05/01 1230) BP: (78-151)/(59-101) 102/80 (05/01 1230) SpO2:  [87 %-100 %] 96 % (05/01 1230) FiO2 (%):  [28 %] 28 % (05/01 1110)  Physical Examination:  RASS of -1, anxious and non focal neuro exam On vent, no distress, BEAE and no rales.  S1 & S2 audible and no murmur Benign abdomen with normal peristalses Ext:  wasted and no edema     ASSESSMENT / PLAN:  Acute on chronic respiratory failure with base line home O2 2 L/min and steroid dependent (Prednisone 10 mg PO daily). Sever anxiety and panic once of sedation on SBT with increased work of breathing. Optimize anxiolytics Xanax + Risperdal and continue with vent support  COPD exacerbation -Bronchodilators + inhaled steroids + ABX + tapering systemic steroids  Pneumonia (Sput GPC and GNR) -Start on empiric Zosyn. D/c Levoflox and follow with culture results -Monitor CXR + CBC + FIO2.  AKI (improved) due to renal hypoperfusion with hypotension -Avoid nephrotoxins, monitor renal panel and urine out put.  Cocaine intoxication -Supportive care  Hypotension secondary to meds (resolved). Off Levophed -Monitor hemodynamics  Elevated CI with demand vs supply mismatch. H/o CAD, ECHO 2017 LV EF 60-65% -Monitor EKG, ECHO and CI. -ASA + BB as tolerated and consider cardiology consult.  HTN -Optimize antihypertensives and monitor hemodynamics. Dyslipidemia  Anxiety -Xanax + Risperdal  Anemia -Keep HB > 7 gm/dl  Full code  DVT & GI prophylaxis. Continue with supportive care  Critical care time 35 min

## 2017-10-02 NOTE — Progress Notes (Signed)
Daughter [name]) was given clear bag of rings and bracelet to take home.

## 2017-10-02 NOTE — Progress Notes (Signed)
Pt fingers becoming swollen slightly without pitting and 9 rings were removed, all grey in color, 1 in a shape of a butterfly, 1 with a red stone, another with a green stone. A anklet and toe ring was also removed. All placed in a clear bag with pt name on it.

## 2017-10-02 NOTE — Progress Notes (Signed)
[facility] at [place] NAME: Michelle [name]    MR#:  [id number]  DATE OF BIRTH:  1960/12/09  SUBJECTIVE:  even with respiratory distress patient intubated on the ventilator. She is awake trying to talk with sign language in writing.  REVIEW OF SYSTEMS:   Review of Systems  Unable to perform ROS: Intubated   Tolerating Diet:TF   DRUG ALLERGIES:   Allergies  Allergen Reactions  . Carvedilol Other (See Comments)    Pt gets "sick" when on medication    VITALS:  Blood pressure 102/80, pulse 80, temperature 99.3 F (37.4 C), temperature source Bladder, resp. rate 15, height 4\' 9"  (1.448 m), weight 45.8 kg (100 lb 15.5 oz), last menstrual period 03/06/2005, SpO2 96 %.  PHYSICAL EXAMINATION:   Physical Exam  GENERAL:  57 y.o.-year-old patient lying in the bed with no acute distress. On the vent EYES: Pupils equal, round, reactive to light and accommodation. No scleral icterus. Extraocular muscles intact.  HEENT: Head atraumatic, normocephalic. Oropharynx and nasopharynx clear.  NECK:  Supple, no jugular venous distention. No thyroid enlargement, no tenderness.  LUNGS: Normal breath sounds bilaterally, no wheezing, rales, rhonchi. No use of accessory muscles of respiration.  CARDIOVASCULAR: S1, S2 normal. No murmurs, rubs, or gallops.  ABDOMEN: Soft, nontender, nondistended. Bowel sounds present. No organomegaly or mass.  EXTREMITIES: No cyanosis, clubbing or edema b/l.    NEUROLOGIC: Cranial nerves II through XII are intact. No focal Motor or sensory deficits b/l.   PSYCHIATRIC:  patient is alert and oriented x 3.  SKIN: No obvious rash, lesion, or ulcer.   LABORATORY PANEL:  CBC Recent Labs  Lab 10/02/17 0455  WBC 8.2  HGB 10.5*  HCT 32.5*  PLT 204    Chemistries  Recent Labs  Lab 09/30/17 1705  10/02/17 0455  NA 143   < > 139  K 4.0   < > 4.3  CL 103   < > 109  CO2 31   < > 24  GLUCOSE 110*   < > 127*  BUN 37*    < > 37*  CREATININE 0.89   < > 1.04*  CALCIUM 8.9   < > 8.2*  MG  --   --  2.3  AST 26  --   --   ALT 26  --   --   ALKPHOS 46  --   --   BILITOT 0.4  --   --    < > = values in this interval not displayed.   Cardiac Enzymes Recent Labs  Lab 10/01/17 0417  TROPONINI 0.04*   RADIOLOGY:  Dg Chest Port 1 View  Result Date: 10/02/2017 CLINICAL DATA:  Hypoxia EXAM: PORTABLE CHEST 1 VIEW COMPARISON:  September 30, 2017 FINDINGS: Endotracheal tube tip is 4.5 cm above the carina. Nasogastric tube tip and side port are below the diaphragm. No pneumothorax. Lungs are hyperexpanded. There is no edema or consolidation. The heart size is normal. Pulmonary vascularity is within normal limits. There is aortic atherosclerosis. No adenopathy. No bone lesions. IMPRESSION: Tube positions as described without pneumothorax. Lungs hyperexpanded without edema or consolidation. Stable cardiac silhouette. There is aortic atherosclerosis. Aortic Atherosclerosis (ICD10-I70.0). Electronically Signed   By: Lowella Grip III M.D.   On: 10/02/2017 07:31   Dg Chest Portable 1 View  Result Date: 09/30/2017 CLINICAL DATA:  Orogastric tube placement EXAM: PORTABLE CHEST 1 VIEW COMPARISON:  Earlier today FINDINGS: An orogastric tube tip reaches the  stomach. The side port is near the GE junction. Endotracheal tube tip between the clavicular heads and carina. Large lung volumes with lucent appearance. There is no edema, consolidation, effusion, or pneumothorax. Normal heart size and mediastinal contours. Artifact from EKG leads. IMPRESSION: The new orogastric tube is in good position. Otherwise stable from earlier today. Electronically Signed   By: Monte Fantasia M.D.   On: 09/30/2017 18:48   Dg Chest Port 1 View  Result Date: 09/30/2017 CLINICAL DATA:  Status post intubation. EXAM: PORTABLE CHEST 1 VIEW COMPARISON:  September 30, 2017 FINDINGS: The ETT is in good position. No pneumothorax. Tiny effusions with atelectasis. No  other changes. Emphysema. IMPRESSION: 1. The ETT is in good position.  No other changes. Electronically Signed   By: Dorise Bullion III M.D   On: 09/30/2017 17:28   Dg Chest Port 1 View  Result Date: 09/30/2017 CLINICAL DATA:  Shortness of breath. EXAM: PORTABLE CHEST 1 VIEW COMPARISON:  CT scan August 23, 2017.  Chest x-ray August 20, 2017. FINDINGS: Hyperexpansion of the lungs consistent with emphysema. Tiny pleural effusions and underlying atelectasis. No other interval changes. IMPRESSION: Emphysema.  Tiny pleural effusions and underlying atelectasis. Electronically Signed   By: Dorise Bullion III M.D   On: 09/30/2017 17:27   ASSESSMENT AND PLAN:   Michelle [name]  is a 57 y.o. female with a known history of anxiety, CHF, cocaine abuse, COPD, coronary artery disease, hepatitis C, hypertension- as severely short of breath at home and so called EMS. On the way EMT gave her 5-6 breathing treatments but she was still extremely short of breath in the emergency room on arrival, so ER physician intubated her  * acute on chronic respiratory failure with hypoxia secondary to acute on chronic COPD exacerbation  -  IV steroids and nebulizer therapy -  Levaquin to treat bronchitis. -  Continue ventilatory support and intensivist to help with further management.  * hypotension   -Could be due to respiratory failure and intubation  - Lactic acid is normal.   -Continue IV fluids and monitor.  - Currently on norepinephrine drip.  * history of hypertension   -Hold medications for now.  * elevated white blood cell count -  Likely secondary to bronchitis or infection, continue monitoring. -20.4--15.2 -BC neg  * UDS positive for cocaine   Case discussed with Care Management/Social Worker. Management plans discussed with the patient, family and they are in agreement.  CODE STATUS: FULL  DVT Prophylaxis: heparin  TOTAL TIME TAKING CARE OF THIS PATIENT: *30* minutes.  >50% time spent on  counselling and coordination of care  POSSIBLE D/C IN *few* DAYS, DEPENDING ON CLINICAL CONDITION.  Note: This dictation was prepared with Dragon dictation along with smaller phrase technology. Any transcriptional errors that result from this process are unintentional.  Fritzi Mandes M.D on 10/02/2017 at 3:29 PM  Between 7am to 6pm - Pager - [phone]  After 6pm go to [email or web address] - password [email or web address] [facility]  Office  [phone]  CC: Primary care physician; [facility] HealthPatient ID: AZAYA GOEDDE, female   DOB: 1960-12-26, 56 y.o.   MRN: [id number]

## 2017-10-02 NOTE — Progress Notes (Signed)
Pt appears comfortable and sedation post propofol initiation.  Monitor vital signs closely for hypotension.

## 2017-10-03 ENCOUNTER — Inpatient Hospital Stay: Payer: Medicaid Other

## 2017-10-03 LAB — GLUCOSE, CAPILLARY
GLUCOSE-CAPILLARY: 118 mg/dL — AB (ref 65–99)
Glucose-Capillary: 121 mg/dL — ABNORMAL HIGH (ref 65–99)
Glucose-Capillary: 131 mg/dL — ABNORMAL HIGH (ref 65–99)
Glucose-Capillary: 162 mg/dL — ABNORMAL HIGH (ref 65–99)
Glucose-Capillary: 170 mg/dL — ABNORMAL HIGH (ref 65–99)
Glucose-Capillary: 171 mg/dL — ABNORMAL HIGH (ref 65–99)

## 2017-10-03 LAB — CBC WITH DIFFERENTIAL/PLATELET
Basophils Absolute: 0 10*3/uL (ref 0–0.1)
Basophils Relative: 0 %
EOS PCT: 0 %
Eosinophils Absolute: 0 10*3/uL (ref 0–0.7)
HEMATOCRIT: 32 % — AB (ref 35.0–47.0)
Hemoglobin: 10.5 g/dL — ABNORMAL LOW (ref 12.0–16.0)
LYMPHS ABS: 0.2 10*3/uL — AB (ref 1.0–3.6)
LYMPHS PCT: 4 %
MCH: 29 pg (ref 26.0–34.0)
MCHC: 32.7 g/dL (ref 32.0–36.0)
MCV: 88.6 fL (ref 80.0–100.0)
MONO ABS: 0.2 10*3/uL (ref 0.2–0.9)
Monocytes Relative: 4 %
NEUTROS ABS: 4.6 10*3/uL (ref 1.4–6.5)
Neutrophils Relative %: 92 %
PLATELETS: 164 10*3/uL (ref 150–440)
RBC: 3.62 MIL/uL — ABNORMAL LOW (ref 3.80–5.20)
RDW: 14.6 % — ABNORMAL HIGH (ref 11.5–14.5)
WBC: 5 10*3/uL (ref 3.6–11.0)

## 2017-10-03 LAB — BASIC METABOLIC PANEL
Anion gap: 6 (ref 5–15)
BUN: 43 mg/dL — ABNORMAL HIGH (ref 6–20)
CHLORIDE: 110 mmol/L (ref 101–111)
CO2: 23 mmol/L (ref 22–32)
Calcium: 8.1 mg/dL — ABNORMAL LOW (ref 8.9–10.3)
Creatinine, Ser: 1.08 mg/dL — ABNORMAL HIGH (ref 0.44–1.00)
GFR calc Af Amer: >60 mL/min (ref >60)
GFR calc non Af Amer: 56 mL/min — ABNORMAL LOW (ref >60)
GLUCOSE: 175 mg/dL — AB (ref 65–99)
POTASSIUM: 4.2 mmol/L (ref 3.5–5.1)
Sodium: 139 mmol/L (ref 135–145)

## 2017-10-03 LAB — CULTURE, RESPIRATORY

## 2017-10-03 LAB — CALCIUM, IONIZED: CALCIUM, IONIZED, SERUM: 4.2 mg/dL — AB (ref 4.5–5.6)

## 2017-10-03 LAB — CULTURE, RESPIRATORY W GRAM STAIN: Culture: NORMAL

## 2017-10-03 LAB — TRIGLYCERIDES: Triglycerides: 264 mg/dL — ABNORMAL HIGH (ref <150)

## 2017-10-03 MED ORDER — DOCUSATE SODIUM 50 MG/5ML PO LIQD
100.0000 mg | Freq: Two times a day (BID) | ORAL | Status: DC
  Administered 2017-10-03 – 2017-10-08 (×11): 100 mg
  Filled 2017-10-03 (×11): qty 10

## 2017-10-03 MED ORDER — METHYLPREDNISOLONE SODIUM SUCC 40 MG IJ SOLR
40.0000 mg | Freq: Three times a day (TID) | INTRAMUSCULAR | Status: DC
  Administered 2017-10-03 – 2017-10-15 (×36): 40 mg via INTRAVENOUS
  Filled 2017-10-03 (×36): qty 1

## 2017-10-03 MED ORDER — RISPERIDONE 1 MG/ML PO SOLN
1.0000 mg | Freq: Two times a day (BID) | ORAL | Status: DC
  Administered 2017-10-03 – 2017-10-11 (×16): 1 mg
  Filled 2017-10-03 (×18): qty 1

## 2017-10-03 NOTE — Progress Notes (Signed)
Name: Michelle [name] MRN: [id number] DOB: 1961/03/02     CONSULTATION DATE: 09/30/2017  Subjective & Objectives: Propofol + Fentanyl for sedation and no major issues last night  PAST MEDICAL HISTORY :   has a past medical history of Allergy, Anxiety, Asthma, CHF (congestive heart failure) ([place]), Cocaine abuse ([place]), COPD (chronic obstructive pulmonary disease) ([place]), Coronary artery disease, Emphysema/COPD ([place]), Hepatitis C, High cholesterol, Hypertension, Pneumothorax, and Tobacco abuse.  has a past surgical history that includes Cardiac catheterization and Chest tube insertion. Prior to Admission medications   Medication Sig Start Date End Date Taking? Authorizing Provider  albuterol (PROVENTIL HFA;VENTOLIN HFA) 108 (90 Base) MCG/ACT inhaler Inhale 2 puffs into the lungs every 6 (six) hours as needed for wheezing or shortness of breath. 02/26/17  Yes Dustin Flock, MD  ALPRAZolam Duanne Moron) 0.5 MG tablet Take 1 tablet (0.5 mg total) by mouth 3 (three) times daily as needed for anxiety. 08/24/17  Yes Bettey Costa, MD  aspirin EC 81 MG tablet Take 81 mg by mouth daily.   Yes [provider]  ipratropium-albuterol (DUONEB) 0.5-2.5 (3) MG/3ML SOLN Take 3 mLs by nebulization every 6 (six) hours as needed. 01/04/17  Yes [name], MD  mirtazapine (REMERON) 30 MG tablet Take 30 mg by mouth at bedtime.   Yes [provider]  predniSONE (DELTASONE) 10 MG tablet Take 1 tablet (10 mg total) by mouth daily with breakfast. 08/24/17  Yes Mody, Sital, MD  SYMBICORT 160-4.5 MCG/ACT inhaler Take 2 puffs by mouth 2 (two) times daily.   Yes [provider]  tiotropium (SPIRIVA) 18 MCG inhalation capsule Place 1 capsule (18 mcg total) into inhaler and inhale daily. 01/04/17  Yes [name], MD  carvedilol (COREG) 3.125 MG tablet Take 1 tablet (3.125 mg total) by mouth every morning. Patient not taking: Reported on 09/30/2017 02/10/16   Gladstone Lighter, MD  enalapril  (VASOTEC) 10 MG tablet Take 0.5 tablets (5 mg total) by mouth daily. Patient not taking: Reported on 09/30/2017 02/10/16   Gladstone Lighter, MD  feeding supplement, ENSURE ENLIVE, (ENSURE ENLIVE) LIQD Take 237 mLs by mouth 2 (two) times daily between meals. 08/24/17   Bettey Costa, MD  guaiFENesin-codeine 100-10 MG/5ML syrup Take 10 mLs by mouth every 4 (four) hours as needed for cough. Patient not taking: Reported on 08/20/2017 07/02/17   Demetrios Loll, MD  levofloxacin (LEVAQUIN) 750 MG tablet Take 1 tablet (750 mg total) by mouth daily. Patient not taking: Reported on 09/30/2017 08/24/17   Bettey Costa, MD  polyethylene glycol (GOLYTELY) 236 g solution Drink one 8 oz glass every 20 mins until stools are clear starting at 5:00pm on 08/14/17 Patient not taking: Reported on 09/30/2017 07/26/17   Lucilla Lame, MD   Allergies  Allergen Reactions  . Carvedilol Other (See Comments)    Pt gets "sick" when on medication    FAMILY HISTORY:  family history includes Asthma in her mother; Breast cancer (age of onset: 83) in her maternal aunt; CAD in her father; COPD in her sister; CVA in her father; Lung cancer in her mother; Stroke in her father. SOCIAL HISTORY:  reports that she has been smoking cigarettes.  She has a 4.10 pack-year smoking history. She has never used smokeless tobacco. She reports that she has current or past drug history. Drug: "Crack" cocaine. She reports that she does not drink alcohol.  REVIEW OF SYSTEMS:   Unable to obtain due to critical illness   VITAL SIGNS: Temp:  [97.7 F (36.5  C)-100 F (37.8 C)] 98.4 F (36.9 C) (05/02 1200) Pulse Rate:  [65-119] 76 (05/02 1500) Resp:  [10-32] 14 (05/02 1500) BP: (75-143)/(58-97) 87/60 (05/02 1500) SpO2:  [89 %-100 %] 99 % (05/02 1502) FiO2 (%):  [28 %-35 %] 35 % (05/02 1502)  Physical Examination:  RASS of -1, anxious and non focal neuro exam On vent, moderate distress once awake on SBT, BEAE and no rales.  S1 & S2 audible and no  murmur Benign abdomen with normal peristalses Ext: wasted and no edema  ASSESSMENT / PLAN:  Acute on chronic respiratory failure with base line home O2 2 L/min and steroid dependent (Prednisone 10 mg PO daily). Sever anxiety and panic once of sedation on SBT with increased work of breathing. Optimize anxiolytics Xanax + Risperdal and continue with vent support  COPD exacerbation -Bronchodilators + inhaled steroids + ABX + tapering systemic steroids  Pneumonia (Sput -ve). -Start on empiric Zosyn. D/c Levoflox and follow with culture results -Monitor CXR + CBC + FIO2. -Consider de-escalation of ABX with clinical improvement.  AKI (improved) due to renal hypoperfusion with hypotension. Prerenal azotemia due to negative catabolic effect of steroids. -Avoid nephrotoxins, optimize steroids, monitor renal panel and urine out put.  Cocaine intoxication -Supportive care  Hypotension secondary to meds (resolved). Off Levophed -Monitor hemodynamics  Elevated CI with demand vs supply mismatch. H/o CAD, ECHO 2017 LV EF 60-65% -Monitor EKG, ECHO and CI. -ASA + BB as tolerated and consider cardiology consult.  HTN -Optimize antihypertensives and monitor hemodynamics. Dyslipidemia  Anxiety -Xanax + Risperdal  Anemia -Keep HB > 7 gm/dl  Full code  DVT & GI prophylaxis. Continue with supportive care  Critical care time 35 min

## 2017-10-03 NOTE — Progress Notes (Signed)
Restarted sedation per Dr Soyla Murphy. Patient became agitated, labored breathing, tachypniec.

## 2017-10-03 NOTE — Progress Notes (Signed)
[place] at [place] NAME: Michelle [name]    MR#:  [id number]  DATE OF BIRTH:  [birth year]  SUBJECTIVE:  even with respiratory distress patient intubated on the ventilator.  Failed weaning trial today. Patient became very tachypnea can tachycardic. On IV propofol and fentanyl drip  REVIEW OF SYSTEMS:   Review of Systems  Unable to perform ROS: Intubated   Tolerating Diet:TF   DRUG ALLERGIES:   Allergies  Allergen Reactions  . Carvedilol Other (See Comments)    Pt gets "sick" when on medication    VITALS:  Blood pressure (!) 87/60, pulse 76, temperature 98.4 F (36.9 C), resp. rate 14, height 4\' 9"  (1.448 m), weight 45.8 kg (100 lb 15.5 oz), last menstrual period 03/06/2005, SpO2 99 %.  PHYSICAL EXAMINATION:   Physical Exam  GENERAL:  57 y.o.-year-old patient lying in the bed with no acute distress. On the vent EYES: Pupils equal, round, reactive to light and accommodation. No scleral icterus. Extraocular muscles intact.  HEENT: Head atraumatic, normocephalic. Oropharynx and nasopharynx clear.  NECK:  Supple, no jugular venous distention. No thyroid enlargement, no tenderness.  LUNGS: Normal breath sounds bilaterally, no wheezing, rales, rhonchi. No use of accessory muscles of respiration.  CARDIOVASCULAR: S1, S2 normal. No murmurs, rubs, or gallops.  ABDOMEN: Soft, nontender, nondistended. Bowel sounds present. No organomegaly or mass.  EXTREMITIES: No cyanosis, clubbing or edema b/l.    NEUROLOGIC: Cranial nerves II through XII are intact. No focal Motor or sensory deficits b/l.   PSYCHIATRIC:  patient is alert and oriented x 3.  SKIN: No obvious rash, lesion, or ulcer.   LABORATORY PANEL:  CBC Recent Labs  Lab 10/03/17 0426  WBC 5.0  HGB 10.5*  HCT 32.0*  PLT 164    Chemistries  Recent Labs  Lab 09/30/17 1705  10/02/17 0455 10/03/17 0426  NA 143   < > 139 139  K 4.0   < > 4.3 4.2  CL 103   < > 109 110   CO2 31   < > 24 23  GLUCOSE 110*   < > 127* 175*  BUN 37*   < > 37* 43*  CREATININE 0.89   < > 1.04* 1.08*  CALCIUM 8.9   < > 8.2* 8.1*  MG  --   --  2.3  --   AST 26  --   --   --   ALT 26  --   --   --   ALKPHOS 46  --   --   --   BILITOT 0.4  --   --   --    < > = values in this interval not displayed.   Cardiac Enzymes Recent Labs  Lab 10/01/17 0417  TROPONINI 0.04*   RADIOLOGY:  Dg Chest Port 1 View  Result Date: 10/03/2017 CLINICAL DATA:  Atelectasis EXAM: PORTABLE CHEST 1 VIEW COMPARISON:  10/02/2017 FINDINGS: Endotracheal tube in good position.  NG tube in the stomach. COPD with hyperinflation. Negative for infiltrate effusion or edema. IMPRESSION: Endotracheal tube in good position. COPD.  The lungs remain clear. Electronically Signed   By: Franchot Gallo M.D.   On: 10/03/2017 09:16   Dg Chest Port 1 View  Result Date: 10/02/2017 CLINICAL DATA:  Hypoxia EXAM: PORTABLE CHEST 1 VIEW COMPARISON:  September 30, 2017 FINDINGS: Endotracheal tube tip is 4.5 cm above the carina. Nasogastric tube tip and side port are below the diaphragm. No pneumothorax. Lungs  are hyperexpanded. There is no edema or consolidation. The heart size is normal. Pulmonary vascularity is within normal limits. There is aortic atherosclerosis. No adenopathy. No bone lesions. IMPRESSION: Tube positions as described without pneumothorax. Lungs hyperexpanded without edema or consolidation. Stable cardiac silhouette. There is aortic atherosclerosis. Aortic Atherosclerosis (ICD10-I70.0). Electronically Signed   By: Lowella Grip III M.D.   On: 10/02/2017 07:31   ASSESSMENT AND PLAN:   Michelle [name]  is a 57 y.o. female with a known history of anxiety, CHF, cocaine abuse, COPD, coronary artery disease, hepatitis C, hypertension- as severely short of breath at home and so called EMS. On the way EMT gave her 5-6 breathing treatments but she was still extremely short of breath in the emergency room on arrival, so ER  physician intubated her  * acute on chronic respiratory failure with hypoxia secondary to acute on chronic COPD exacerbation  -  IV steroids and nebulizer therapy -  Levaquin to treat bronchitis. -  Continue ventilatory support and intensivist to help with further management.--failed wean trial today  * hypotension   -Could be due to respiratory failure and intubation  - Lactic acid is normal.   -Continue IV fluids and monitor.  - Currently on norepinephrine drip.  * history of hypertension   -Hold medications for now.  * elevated white blood cell count -  Likely secondary to bronchitis or infection, continue monitoring. -20.4--15.2 -BC neg  * UDS positive for cocaine   Case discussed with Care Management/Social Worker. Management plans discussed with the patient, family and they are in agreement.  CODE STATUS: FULL  DVT Prophylaxis: heparin  TOTAL TIME TAKING CARE OF THIS PATIENT: *30* minutes.  >50% time spent on counselling and coordination of care  POSSIBLE D/C IN *few* DAYS, DEPENDING ON CLINICAL CONDITION.  Note: This dictation was prepared with Dragon dictation along with smaller phrase technology. Any transcriptional errors that result from this process are unintentional.  Fritzi Mandes M.D on 10/03/2017 at 3:30 PM  Between 7am to 6pm - Pager - [phone]  After 6pm go to [email or web address] - password [email or web address] [facility]  Office  [phone]  CC: Primary care physician; [facility]Patient ID: Michelle [name], female   DOB: 07-03-1960, 57 y.o.   MRN: [id number]

## 2017-10-04 ENCOUNTER — Inpatient Hospital Stay: Payer: Medicaid Other

## 2017-10-04 ENCOUNTER — Inpatient Hospital Stay: Payer: Self-pay

## 2017-10-04 LAB — BLOOD GAS, ARTERIAL
Acid-Base Excess: 0.2 mmol/L (ref 0.0–2.0)
BICARBONATE: 27.7 mmol/L (ref 20.0–28.0)
FIO2: 0.3
MECHVT: 450 mL
O2 Saturation: 75.4 %
PATIENT TEMPERATURE: 37
PCO2 ART: 59 mmHg — AB (ref 32.0–48.0)
PEEP/CPAP: 5 cmH2O
PO2 ART: 46 mmHg — AB (ref 83.0–108.0)
RATE: 16 resp/min
pH, Arterial: 7.28 — ABNORMAL LOW (ref 7.350–7.450)

## 2017-10-04 LAB — GLUCOSE, CAPILLARY
GLUCOSE-CAPILLARY: 100 mg/dL — AB (ref 65–99)
GLUCOSE-CAPILLARY: 113 mg/dL — AB (ref 65–99)
Glucose-Capillary: 119 mg/dL — ABNORMAL HIGH (ref 65–99)
Glucose-Capillary: 131 mg/dL — ABNORMAL HIGH (ref 65–99)
Glucose-Capillary: 179 mg/dL — ABNORMAL HIGH (ref 65–99)
Glucose-Capillary: 141 mg/dL — ABNORMAL HIGH (ref 65–99)

## 2017-10-04 LAB — BASIC METABOLIC PANEL
Anion gap: 8 (ref 5–15)
BUN: 39 mg/dL — ABNORMAL HIGH (ref 6–20)
CHLORIDE: 107 mmol/L (ref 101–111)
CO2: 25 mmol/L (ref 22–32)
Calcium: 8.3 mg/dL — ABNORMAL LOW (ref 8.9–10.3)
Creatinine, Ser: 0.76 mg/dL (ref 0.44–1.00)
Glucose, Bld: 118 mg/dL — ABNORMAL HIGH (ref 65–99)
Potassium: 4.2 mmol/L (ref 3.5–5.1)
SODIUM: 140 mmol/L (ref 135–145)

## 2017-10-04 MED ORDER — SODIUM CHLORIDE 0.9% FLUSH
10.0000 mL | INTRAVENOUS | Status: DC | PRN
  Administered 2017-11-13 – 2017-12-15 (×4): 10 mL
  Filled 2017-10-04 (×4): qty 40

## 2017-10-04 MED ORDER — ALPRAZOLAM 1 MG PO TABS
1.0000 mg | ORAL_TABLET | Freq: Three times a day (TID) | ORAL | Status: DC
  Administered 2017-10-04 – 2017-10-21 (×49): 1 mg
  Filled 2017-10-04 (×50): qty 1

## 2017-10-04 MED ORDER — SODIUM CHLORIDE 0.9% FLUSH
10.0000 mL | Freq: Two times a day (BID) | INTRAVENOUS | Status: DC
  Administered 2017-10-06: 10 mL
  Administered 2017-10-06: 30 mL
  Administered 2017-10-07: 40 mL
  Administered 2017-10-07 – 2017-10-11 (×9): 10 mL
  Administered 2017-10-12: 30 mL
  Administered 2017-10-13 – 2017-10-14 (×2): 40 mL
  Administered 2017-10-15 – 2017-10-23 (×18): 10 mL
  Administered 2017-10-24: 30 mL
  Administered 2017-10-25: 10 mL
  Administered 2017-10-25: 20 mL
  Administered 2017-10-26 – 2017-11-01 (×10): 10 mL
  Administered 2017-11-02: 20 mL
  Administered 2017-11-02 – 2017-11-03 (×2): 10 mL
  Administered 2017-11-03: 20 mL
  Administered 2017-11-04 – 2017-11-05 (×3): 10 mL
  Administered 2017-11-06: 30 mL
  Administered 2017-11-06 – 2017-11-12 (×12): 10 mL
  Administered 2017-11-13: 30 mL

## 2017-10-04 MED ORDER — VITAL AF 1.2 CAL PO LIQD
1000.0000 mL | ORAL | Status: DC
  Administered 2017-10-04 – 2017-10-07 (×4): 1000 mL

## 2017-10-04 NOTE — Progress Notes (Signed)
Spoke with Dr Senaida Ores about pt's emesis of tube feedings.  Stated to clamp OG tube at this time.

## 2017-10-04 NOTE — Progress Notes (Signed)
Spoke with Dr Soyla Murphy regarding patient having labored breathing, bp 190's. Sedation off at 7:00 am. He will be in to evaluate patient.

## 2017-10-04 NOTE — Progress Notes (Signed)
Name: Michelle [name] MRN: [id number] DOB: Sep 18, 1960     CONSULTATION DATE: 09/30/2017 Subjective & Objective: Continues to be on vent and had episode of vomiting last night. PAST MEDICAL HISTORY :   has a past medical history of Allergy, Anxiety, Asthma, CHF (congestive heart failure) ([place]), Cocaine abuse ([place]), COPD (chronic obstructive pulmonary disease) ([place]), Coronary artery disease, Emphysema/COPD ([place]), Hepatitis C, High cholesterol, Hypertension, Pneumothorax, and Tobacco abuse.  has a past surgical history that includes Cardiac catheterization and Chest tube insertion. Prior to Admission medications   Medication Sig Start Date End Date Taking? Authorizing Provider  albuterol (PROVENTIL HFA;VENTOLIN HFA) 108 (90 Base) MCG/ACT inhaler Inhale 2 puffs into the lungs every 6 (six) hours as needed for wheezing or shortness of breath. 02/26/17  Yes Dustin Flock, MD  ALPRAZolam Duanne Moron) 0.5 MG tablet Take 1 tablet (0.5 mg total) by mouth 3 (three) times daily as needed for anxiety. 08/24/17  Yes Bettey Costa, MD  aspirin EC 81 MG tablet Take 81 mg by mouth daily.   Yes [provider]  ipratropium-albuterol (DUONEB) 0.5-2.5 (3) MG/3ML SOLN Take 3 mLs by nebulization every 6 (six) hours as needed. 01/04/17  Yes [name], MD  mirtazapine (REMERON) 30 MG tablet Take 30 mg by mouth at bedtime.   Yes [provider]  predniSONE (DELTASONE) 10 MG tablet Take 1 tablet (10 mg total) by mouth daily with breakfast. 08/24/17  Yes Mody, Sital, MD  SYMBICORT 160-4.5 MCG/ACT inhaler Take 2 puffs by mouth 2 (two) times daily.   Yes [provider]  tiotropium (SPIRIVA) 18 MCG inhalation capsule Place 1 capsule (18 mcg total) into inhaler and inhale daily. 01/04/17  Yes [name], MD  carvedilol (COREG) 3.125 MG tablet Take 1 tablet (3.125 mg total) by mouth every morning. Patient not taking: Reported on 09/30/2017 02/10/16   Gladstone Lighter, MD  enalapril  (VASOTEC) 10 MG tablet Take 0.5 tablets (5 mg total) by mouth daily. Patient not taking: Reported on 09/30/2017 02/10/16   Gladstone Lighter, MD  feeding supplement, ENSURE ENLIVE, (ENSURE ENLIVE) LIQD Take 237 mLs by mouth 2 (two) times daily between meals. 08/24/17   Bettey Costa, MD  guaiFENesin-codeine 100-10 MG/5ML syrup Take 10 mLs by mouth every 4 (four) hours as needed for cough. Patient not taking: Reported on 08/20/2017 07/02/17   Demetrios Loll, MD  levofloxacin (LEVAQUIN) 750 MG tablet Take 1 tablet (750 mg total) by mouth daily. Patient not taking: Reported on 09/30/2017 08/24/17   Bettey Costa, MD  polyethylene glycol (GOLYTELY) 236 g solution Drink one 8 oz glass every 20 mins until stools are clear starting at 5:00pm on 08/14/17 Patient not taking: Reported on 09/30/2017 07/26/17   Lucilla Lame, MD   Allergies  Allergen Reactions  . Carvedilol Other (See Comments)    Pt gets "sick" when on medication    FAMILY HISTORY:  family history includes Asthma in her mother; Breast cancer (age of onset: 68) in her maternal aunt; CAD in her father; COPD in her sister; CVA in her father; Lung cancer in her mother; Stroke in her father. SOCIAL HISTORY:  reports that she has been smoking cigarettes.  She has a 4.10 pack-year smoking history. She has never used smokeless tobacco. She reports that she has current or past drug history. Drug: "Crack" cocaine. She reports that she does not drink alcohol.  REVIEW OF SYSTEMS:   Unable to obtain due to critical illness   VITAL SIGNS: Temp:  [97.2 F (36.2 C)-99.1  F (37.3 C)] 97.3 F (36.3 C) (05/03 0700) Pulse Rate:  [60-119] 60 (05/03 0700) Resp:  [11-16] 16 (05/03 0700) BP: (78-143)/(58-92) 92/64 (05/03 0700) SpO2:  [89 %-100 %] 91 % (05/03 0819) FiO2 (%):  [30 %-35 %] 30 % (05/03 0400) Weight:  [52.2 kg (115 lb 1.3 oz)] 52.2 kg (115 lb 1.3 oz) (05/03 0408)  Physical Examination:  Sedated, anxious when awake and non focal neuro exam On  vent,moderate distress once awake on SBT,BEAE and diffuse expiratory wheezes.  S1 & S2 audible and no murmur Benign abdomen with normal peristalses Ext: wasted and no edema  ASSESSMENT / PLAN:  Acute on chronic respiratory failure with base line home O2 2 L/min and steroid dependent (Prednisone 10 mg PO daily). Sever anxiety and panic once off sedation on SBT with increased work of breathing. Optimize anxiolytics Xanax + Risperdal and continue with vent support  COPD exacerbation -Bronchodilators + inhaled steroids + ABX + tapering systemic steroids  Pneumonia (Sput -ve). -c/w Zosyn. -Monitor CXR + CBC + FIO2. -Consider de-escalation of ABX with clinical improvement.  AKI(improved)due to renal hypoperfusion with hypotension. Prerenal azotemia due to negative catabolic effect of steroids. -Avoid nephrotoxins, optimize steroids, monitor renal panel and urine out put.  Cocaine intoxication -Supportive care  Hypotension secondary to meds (resolved). Off Levophed -Monitor hemodynamics  Elevated CI with demand vs supply mismatch. H/o CAD, ECHO 2017 LV EF 60-65% -Monitor EKG, ECHO and CI. -ASA + BB as tolerated and consider cardiology consult.  HTN -Optimize antihypertensives and monitor hemodynamics. Dyslipidemia  Anxiety -Xanax+ Risperdal  Hypertriglyceridemia with Propofol -D/C Propofol and monitor TG.  Anemia -Keep HB > 7 gm/dl  Full code  DVT & GI prophylaxis. Continue with supportive care  Critical care time56min

## 2017-10-04 NOTE — Progress Notes (Signed)
Patient placed back into previous PRVC settings due to excessive agitation and decreased saturations

## 2017-10-04 NOTE — Progress Notes (Signed)
Peripherally Inserted Central Catheter/Midline Placement  The IV Nurse has discussed with the patient and/or persons authorized to consent for the patient, the purpose of this procedure and the potential benefits and risks involved with this procedure.  The benefits include less needle sticks, lab draws from the catheter, and the patient may be discharged home with the catheter. Risks include, but not limited to, infection, bleeding, blood clot (thrombus formation), and puncture of an artery; nerve damage and irregular heartbeat and possibility to perform a PICC exchange if needed/ordered by physician.  Alternatives to this procedure were also discussed.  Bard Power PICC patient education guide, fact sheet on infection prevention and patient information card has been provided to patient /or left at bedside.    PICC/Midline Placement Documentation  PICC Triple Lumen 10/04/17 PICC Right Brachial 36 cm 0 cm (Active)  Indication for Insertion or Continuance of Line Prolonged intravenous therapies 10/04/2017  5:00 PM  Exposed Catheter (cm) 0 cm 10/04/2017  5:00 PM  Site Assessment Clean;Dry;Intact 10/04/2017  5:00 PM  Lumen #1 Status Flushed;Blood return noted 10/04/2017  5:00 PM  Lumen #2 Status Flushed;Blood return noted 10/04/2017  5:00 PM  Lumen #3 Status Flushed;Blood return noted 10/04/2017  5:00 PM  Dressing Type Transparent 10/04/2017  5:00 PM  Dressing Status Clean;Dry;Intact;Antimicrobial disc in place 10/04/2017  5:00 PM  Dressing Change Due 10/11/17 10/04/2017  5:00 PM       Jule Economy Horton 10/04/2017, 5:47 PM

## 2017-10-04 NOTE — Progress Notes (Signed)
[facility] [facility] [place] NAME: Michelle [name]    MR#:  [id number]  DATE OF BIRTH:  [birth year]  SUBJECTIVE:  even with respiratory distress patient intubated on the ventilator.  Failed weaning trial today. Patient became very tachypnea can tachycardic. On IV precedex and fentanyl drip  REVIEW OF SYSTEMS:   Review of Systems  Unable to perform ROS: Intubated   Tolerating Diet:TF   DRUG ALLERGIES:   Allergies  Allergen Reactions  . Carvedilol Other (See Comments)    Pt gets "sick" when on medication    VITALS:  Blood pressure 129/78, pulse 74, temperature (!) 96.8 F (36 C), resp. rate 16, height 4\' 9"  (1.448 m), weight 52.2 kg (115 lb 1.3 oz), last menstrual period 03/06/2005, SpO2 97 %.  PHYSICAL EXAMINATION:   Physical Exam  GENERAL:  57 y.o.-year-old patient lying in the bed with no acute distress. On the vent EYES: Pupils equal, round, reactive to light and accommodation. No scleral icterus. Extraocular muscles intact.  HEENT: Head atraumatic, normocephalic. Oropharynx and nasopharynx clear.  NECK:  Supple, no jugular venous distention. No thyroid enlargement, no tenderness.  LUNGS: Normal breath sounds bilaterally, no wheezing, rales, rhonchi. No use of accessory muscles of respiration.  CARDIOVASCULAR: S1, S2 normal. No murmurs, rubs, or gallops.  ABDOMEN: Soft, nontender, nondistended. Bowel sounds present. No organomegaly or mass.  EXTREMITIES: No cyanosis, clubbing or edema b/l.    NEUROLOGIC: Cranial nerves II through XII are intact. No focal Motor or sensory deficits b/l.   PSYCHIATRIC:  patient is alert and oriented x 3.  SKIN: No obvious rash, lesion, or ulcer.   LABORATORY PANEL:  CBC Recent Labs  Lab 10/03/17 0426  WBC 5.0  HGB 10.5*  HCT 32.0*  PLT 164    Chemistries  Recent Labs  Lab 09/30/17 1705  10/02/17 0455  10/04/17 0409  NA 143   < > 139   < > 140  K 4.0   < > 4.3   < > 4.2  CL 103   < >  109   < > 107  CO2 31   < > 24   < > 25  GLUCOSE 110*   < > 127*   < > 118*  BUN 37*   < > 37*   < > 39*  CREATININE 0.89   < > 1.04*   < > 0.76  CALCIUM 8.9   < > 8.2*   < > 8.3*  MG  --   --  2.3  --   --   AST 26  --   --   --   --   ALT 26  --   --   --   --   ALKPHOS 46  --   --   --   --   BILITOT 0.4  --   --   --   --    < > = values in this interval not displayed.   Cardiac Enzymes Recent Labs  Lab 10/01/17 0417  TROPONINI 0.04*   RADIOLOGY:  Dg Abd 1 View  Result Date: 10/04/2017 CLINICAL DATA:  History of pneumonia, history of acute renal injury, smoking history EXAM: ABDOMEN - 1 VIEW COMPARISON:  Portable chest x-ray of 10/03/2017 FINDINGS: A supine view of the abdomen shows an NG tube extending into the proximal body of the stomach. The bowel gas pattern is nonspecific. No bowel obstruction is seen. A vascular sheath overlies the  right pelvis. No bony abnormality is noted. IMPRESSION: 1. NG tube extends to a point overlying the proximal body of the stomach. 2. No bowel obstruction. Electronically Signed   By: Ivar Drape M.D.   On: 10/04/2017 09:54   Dg Chest Port 1 View  Result Date: 10/04/2017 CLINICAL DATA:  Pneumonia EXAM: PORTABLE CHEST 1 VIEW COMPARISON:  10/03/2017 FINDINGS: Endotracheal tube and NG tube are unchanged. Hyperinflation of the lungs. Heart is normal size. Minimal left base atelectasis. No effusions. Right lung clear. No acute bony abnormality. IMPRESSION: Hyperinflation/COPD.  Left base atelectasis. Electronically Signed   By: Rolm Baptise M.D.   On: 10/04/2017 09:51   Dg Chest Port 1 View  Result Date: 10/03/2017 CLINICAL DATA:  Atelectasis EXAM: PORTABLE CHEST 1 VIEW COMPARISON:  10/02/2017 FINDINGS: Endotracheal tube in good position.  NG tube in the stomach. COPD with hyperinflation. Negative for infiltrate effusion or edema. IMPRESSION: Endotracheal tube in good position. COPD.  The lungs remain clear. Electronically Signed   By: Franchot Gallo M.D.    On: 10/03/2017 09:16   ASSESSMENT AND PLAN:   Michelle [name]  is a 57 y.o. female with a known history of anxiety, CHF, cocaine abuse, COPD, coronary artery disease, hepatitis C, hypertension- as severely short of breath at home and so called EMS. On the way EMT gave her 5-6 breathing treatments but she was still extremely short of breath in the emergency room on arrival, so ER physician intubated her  * acute on chronic respiratory failure with hypoxia secondary to acute on chronic COPD exacerbation  -  IV steroids and nebulizer therapy -  Levaquin to treat bronchitis. -  Continue ventilatory support and intensivist to help with further management.--failed wean trial again today  * hypotension   -Could be due to respiratory failure and intubation  - Lactic acid is normal.   -Continue IV fluids and monitor.  - Currently on norepinephrine drip.  * history of hypertension   -Hold medications for now.  * elevated white blood cell count -  Likely secondary to bronchitis or infection, continue monitoring. -20.4--15.2 -BC neg  * UDS positive for cocaine   Case discussed with Care Management/Social Worker. Management plans discussed with the patient, family and they are in agreement.  CODE STATUS: FULL  DVT Prophylaxis: heparin  TOTAL TIME TAKING CARE OF THIS PATIENT: *30* minutes.  >50% time spent on counselling and coordination of care  POSSIBLE D/C IN *few* DAYS, DEPENDING ON CLINICAL CONDITION.  Note: This dictation was prepared with Dragon dictation along with smaller phrase technology. Any transcriptional errors that result from this process are unintentional.  Fritzi Mandes M.D on 10/04/2017 at 2:54 PM  Between 7am to 6pm - Pager - [phone]  After 6pm go to [email or web address] - password [email or web address] [facility]  Office  [phone]  CC: Primary care physician; Center, [facility] HealthPatient ID: Michelle [name], female   DOB: Mar 16, 1961, 57 y.o.   MRN:  [id number]

## 2017-10-05 LAB — GLUCOSE, CAPILLARY
GLUCOSE-CAPILLARY: 151 mg/dL — AB (ref 65–99)
GLUCOSE-CAPILLARY: 156 mg/dL — AB (ref 65–99)
GLUCOSE-CAPILLARY: 193 mg/dL — AB (ref 65–99)
GLUCOSE-CAPILLARY: 200 mg/dL — AB (ref 65–99)
Glucose-Capillary: 138 mg/dL — ABNORMAL HIGH (ref 65–99)
Glucose-Capillary: 142 mg/dL — ABNORMAL HIGH (ref 65–99)
Glucose-Capillary: 159 mg/dL — ABNORMAL HIGH (ref 65–99)

## 2017-10-05 LAB — LACTIC ACID, PLASMA
LACTIC ACID, VENOUS: 0.9 mmol/L (ref 0.5–1.9)
Lactic Acid, Venous: 0.8 mmol/L (ref 0.5–1.9)

## 2017-10-05 LAB — EXPECTORATED SPUTUM ASSESSMENT W GRAM STAIN, RFLX TO RESP C

## 2017-10-05 LAB — CULTURE, BLOOD (ROUTINE X 2)
CULTURE: NO GROWTH
Culture: NO GROWTH
SPECIAL REQUESTS: ADEQUATE

## 2017-10-05 MED ORDER — MIDAZOLAM HCL 2 MG/2ML IJ SOLN
2.0000 mg | Freq: Once | INTRAMUSCULAR | Status: AC
  Administered 2017-10-05: 2 mg via INTRAVENOUS
  Filled 2017-10-05: qty 2

## 2017-10-05 MED ORDER — THEOPHYLLINE 80 MG/15ML PO ELIX
200.0000 mg | ORAL_SOLUTION | Freq: Two times a day (BID) | ORAL | Status: DC
  Administered 2017-10-05 (×2): 200 mg via ORAL
  Filled 2017-10-05 (×3): qty 37.5

## 2017-10-05 MED ORDER — ACETAMINOPHEN 160 MG/5ML PO SOLN
650.0000 mg | Freq: Four times a day (QID) | ORAL | Status: DC | PRN
  Administered 2017-10-05 – 2017-11-07 (×7): 650 mg
  Filled 2017-10-05 (×7): qty 20.3

## 2017-10-05 MED ORDER — VANCOMYCIN HCL 500 MG IV SOLR
500.0000 mg | Freq: Two times a day (BID) | INTRAVENOUS | Status: DC
  Administered 2017-10-06 – 2017-10-07 (×3): 500 mg via INTRAVENOUS
  Filled 2017-10-05 (×5): qty 500

## 2017-10-05 MED ORDER — VANCOMYCIN HCL IN DEXTROSE 1-5 GM/200ML-% IV SOLN
1000.0000 mg | Freq: Once | INTRAVENOUS | Status: AC
  Administered 2017-10-05: 1000 mg via INTRAVENOUS
  Filled 2017-10-05: qty 200

## 2017-10-05 NOTE — Progress Notes (Signed)
[place] at [place] NAME: Michelle [name]    MR#:  [id number]  DATE OF BIRTH:  1960-07-19  SUBJECTIVE:  CHIEF COMPLAINT:   Chief Complaint  Patient presents with  . Shortness of Breath   - remains intubated, failed weaning trial yesterday, on sedation and on tube feeds  REVIEW OF SYSTEMS:  Review of Systems  Unable to perform ROS: Critical illness    DRUG ALLERGIES:   Allergies  Allergen Reactions  . Carvedilol Other (See Comments)    Pt gets "sick" when on medication    VITALS:  Blood pressure 117/73, pulse 71, temperature (!) 101.1 F (38.4 C), temperature source Core (Comment), resp. rate 16, height 4\' 9"  (1.448 m), weight 52.2 kg (115 lb 1.3 oz), last menstrual period 03/06/2005, SpO2 96 %.  PHYSICAL EXAMINATION:  Physical Exam  GENERAL:  57 y.o.-year-old patient lying in the bed with no acute distress.  EYES: Pupils equal, round, reactive to light and accommodation. No scleral icterus. Extraocular muscles intact.  HEENT: Head atraumatic, normocephalic. Oropharynx and nasopharynx clear.orally intubated and has OG tube  NECK:  Supple, no jugular venous distention. No thyroid enlargement, no tenderness.  LUNGS: Normal breath sounds bilaterally, no wheezing, rales,rhonchi or crepitation. No use of accessory muscles of respiration. Decreased bibasilar breath sounds CARDIOVASCULAR: S1, S2 normal. No murmurs, rubs, or gallops.  ABDOMEN: Soft, nontender, nondistended. Bowel sounds present. No organomegaly or mass.  EXTREMITIES: No pedal edema, cyanosis, or clubbing.  NEUROLOGIC: intubated and sedated  PSYCHIATRIC: The patient is sedated.  SKIN: No obvious rash, lesion, or ulcer.    LABORATORY PANEL:   CBC Recent Labs  Lab 10/03/17 0426  WBC 5.0  HGB 10.5*  HCT 32.0*  PLT 164   ------------------------------------------------------------------------------------------------------------------  Chemistries  Recent Labs    Lab 09/30/17 1705  10/02/17 0455  10/04/17 0409  NA 143   < > 139   < > 140  K 4.0   < > 4.3   < > 4.2  CL 103   < > 109   < > 107  CO2 31   < > 24   < > 25  GLUCOSE 110*   < > 127*   < > 118*  BUN 37*   < > 37*   < > 39*  CREATININE 0.89   < > 1.04*   < > 0.76  CALCIUM 8.9   < > 8.2*   < > 8.3*  MG  --   --  2.3  --   --   AST 26  --   --   --   --   ALT 26  --   --   --   --   ALKPHOS 46  --   --   --   --   BILITOT 0.4  --   --   --   --    < > = values in this interval not displayed.   ------------------------------------------------------------------------------------------------------------------  Cardiac Enzymes Recent Labs  Lab 10/01/17 0417  TROPONINI 0.04*   ------------------------------------------------------------------------------------------------------------------  RADIOLOGY:  Dg Abd 1 View  Result Date: 10/04/2017 CLINICAL DATA:  History of pneumonia, history of acute renal injury, smoking history EXAM: ABDOMEN - 1 VIEW COMPARISON:  Portable chest x-ray of 10/03/2017 FINDINGS: A supine view of the abdomen shows an NG tube extending into the proximal body of the stomach. The bowel gas pattern is nonspecific. No bowel obstruction is seen. A vascular sheath overlies  the right pelvis. No bony abnormality is noted. IMPRESSION: 1. NG tube extends to a point overlying the proximal body of the stomach. 2. No bowel obstruction. Electronically Signed   By: Ivar Drape M.D.   On: 10/04/2017 09:54   Dg Chest Port 1 View  Result Date: 10/04/2017 CLINICAL DATA:  Pneumonia EXAM: PORTABLE CHEST 1 VIEW COMPARISON:  10/03/2017 FINDINGS: Endotracheal tube and NG tube are unchanged. Hyperinflation of the lungs. Heart is normal size. Minimal left base atelectasis. No effusions. Right lung clear. No acute bony abnormality. IMPRESSION: Hyperinflation/COPD.  Left base atelectasis. Electronically Signed   By: Rolm Baptise M.D.   On: 10/04/2017 09:51   [place] Ekg Site Rite  Result Date:  10/04/2017 If Site Rite image not attached, placement could not be confirmed due to current cardiac rhythm.   EKG:   Orders placed or performed during the hospital encounter of 09/30/17  . EKG 12-Lead  . EKG 12-Lead    ASSESSMENT AND PLAN:   Michelle [name] a57 y.o.femalewith a known history of anxiety, CHF, cocaine abuse, COPD, coronary artery disease, hepatitis C, hypertension admitted for acute respiratory failure and intubated.  1.  Acute on chronic respiratory failure with hypoxia-secondary to acute on chronic COPD exacerbation -Remains on vent, continue weaning trial as tolerated -Appreciate pulmonary management -On steroids, nebs and Zosyn for bronchitis/pneumonia  2.  Hypotension-secondary to sedation medications.  Off pressors now.  3.  Anxiety-continue Xanax scheduled.  Also on Precedex and risperidone  4.  DVT prophylaxis-subcutaneous heparin     All the records are reviewed and case discussed with Care Management/Social Workerr. Management plans discussed with the patient, family and they are in agreement.  CODE STATUS: Full Code  TOTAL TIME TAKING CARE OF THIS PATIENT: 38 minutes.   POSSIBLE D/C IN ? DAYS, DEPENDING ON CLINICAL CONDITION.   Gladstone Lighter M.D on 10/05/2017 at 8:50 AM  Between 7am to 6pm - Pager - [phone]  After 6pm go to [email or web address] - password [email or web address] [facility]  Office  [phone]  CC: Primary care physician; Center, [facility]

## 2017-10-05 NOTE — Progress Notes (Signed)
Patient running a temperature. Dr Soyla Murphy notified and will place orders for tylenol.

## 2017-10-05 NOTE — Progress Notes (Signed)
Name: Michelle [name] MRN: [id number] DOB: [birth year]     CONSULTATION DATE: 09/30/2017  Subjective & Objective: Continues on vent, spiked fever, bronchospasm when awake and tolerating TF.  PAST MEDICAL HISTORY :   has a past medical history of Allergy, Anxiety, Asthma, CHF (congestive heart failure) ([place]), Cocaine abuse ([place]), COPD (chronic obstructive pulmonary disease) ([place]), Coronary artery disease, Emphysema/COPD ([place]), Hepatitis C, High cholesterol, Hypertension, Pneumothorax, and Tobacco abuse.  has a past surgical history that includes Cardiac catheterization and Chest tube insertion. Prior to Admission medications   Medication Sig Start Date End Date Taking? Authorizing Provider  albuterol (PROVENTIL HFA;VENTOLIN HFA) 108 (90 Base) MCG/ACT inhaler Inhale 2 puffs into the lungs every 6 (six) hours as needed for wheezing or shortness of breath. 02/26/17  Yes Dustin Flock, MD  ALPRAZolam Duanne Moron) 0.5 MG tablet Take 1 tablet (0.5 mg total) by mouth 3 (three) times daily as needed for anxiety. 08/24/17  Yes Bettey Costa, MD  aspirin EC 81 MG tablet Take 81 mg by mouth daily.   Yes [provider]  ipratropium-albuterol (DUONEB) 0.5-2.5 (3) MG/3ML SOLN Take 3 mLs by nebulization every 6 (six) hours as needed. 01/04/17  Yes [name], MD  mirtazapine (REMERON) 30 MG tablet Take 30 mg by mouth at bedtime.   Yes [provider]  predniSONE (DELTASONE) 10 MG tablet Take 1 tablet (10 mg total) by mouth daily with breakfast. 08/24/17  Yes Mody, Sital, MD  SYMBICORT 160-4.5 MCG/ACT inhaler Take 2 puffs by mouth 2 (two) times daily.   Yes [provider]  tiotropium (SPIRIVA) 18 MCG inhalation capsule Place 1 capsule (18 mcg total) into inhaler and inhale daily. 01/04/17  Yes [name], MD  carvedilol (COREG) 3.125 MG tablet Take 1 tablet (3.125 mg total) by mouth every morning. Patient not taking: Reported on 09/30/2017 02/10/16   Gladstone Lighter, MD    enalapril (VASOTEC) 10 MG tablet Take 0.5 tablets (5 mg total) by mouth daily. Patient not taking: Reported on 09/30/2017 02/10/16   Gladstone Lighter, MD  feeding supplement, ENSURE ENLIVE, (ENSURE ENLIVE) LIQD Take 237 mLs by mouth 2 (two) times daily between meals. 08/24/17   Bettey Costa, MD  guaiFENesin-codeine 100-10 MG/5ML syrup Take 10 mLs by mouth every 4 (four) hours as needed for cough. Patient not taking: Reported on 08/20/2017 07/02/17   Demetrios Loll, MD  levofloxacin (LEVAQUIN) 750 MG tablet Take 1 tablet (750 mg total) by mouth daily. Patient not taking: Reported on 09/30/2017 08/24/17   Bettey Costa, MD  polyethylene glycol (GOLYTELY) 236 g solution Drink one 8 oz glass every 20 mins until stools are clear starting at 5:00pm on 08/14/17 Patient not taking: Reported on 09/30/2017 07/26/17   Lucilla Lame, MD   Allergies  Allergen Reactions  . Carvedilol Other (See Comments)    Pt gets "sick" when on medication    FAMILY HISTORY:  family history includes Asthma in her mother; Breast cancer (age of onset: 23) in her maternal aunt; CAD in her father; COPD in her sister; CVA in her father; Lung cancer in her mother; Stroke in her father. SOCIAL HISTORY:  reports that she has been smoking cigarettes.  She has a 4.10 pack-year smoking history. She has never used smokeless tobacco. She reports that she has current or past drug history. Drug: "Crack" cocaine. She reports that she does not drink alcohol.  REVIEW OF SYSTEMS:   Unable to obtain due to critical illness   VITAL SIGNS: Temp:  [96.8 F (  36 C)-101.1 F (38.4 C)] 101.1 F (38.4 C) (05/04 0830) Pulse Rate:  [64-124] 90 (05/04 1000) Resp:  [0-23] 11 (05/04 1000) BP: (99-203)/(65-110) 113/74 (05/04 1000) SpO2:  [93 %-99 %] 97 % (05/04 1000) FiO2 (%):  [30 %] 30 % (05/04 0837)  Physical Examination:  Sedated, anxious when awake and non focal neuro exam On vent,moderatedistressonce awake on SBT,BEAE and diffuse expiratory  wheezes.  S1 & S2 audible and no murmur Benign abdomen with normal peristalses Ext: wasted and no edema  ASSESSMENT / PLAN:  Acute on chronic respiratory failure with base line home O2 2 L/min and steroid dependent (Prednisone 10 mg PO daily). Sever anxiety and panic once off sedation on SBT with increased work of breathing. Optimize anxiolytics Xanax + Risperdal and continue with vent support  COPD exacerbation, continues to have sever bronchospasm with poor air movement. -Bronchodilators + inhaled steroids + ABX + tapering systemic steroids -Start on Theophylline, monitor HR and therapeutic leve.  Pneumonia (Sput-ve). Spiked fever -c/w Zosy and start on Vanc. -Follow with repeat Blood and tracheal aspirate cultures. -Monitor CXR + CBC + FIO2. -Consider de-escalation of ABX with clinical improvement.  AKI(improved)due to renal hypoperfusion with hypotension. Prerenal azotemia due to negative catabolic effect of steroids. -Avoid nephrotoxins,optimize steroids,monitor renal panel and urine out put.  Cocaine intoxication -Supportive care  Hypotension secondary to meds (resolved). Off Levophed -Monitor hemodynamics  Elevated CI with demand vs supply mismatch. H/o CAD, ECHO 2017 LV EF 60-65% -Monitor EKG, ECHO and CI. -ASA + BB as tolerated and consider cardiology consult.  HTN -Optimize antihypertensives and monitor hemodynamics. Dyslipidemia  Anxiety -Xanax+ Risperdal  Hypertriglyceridemia with Propofol -D/C Propofol and monitor TG.  Anemia -Keep HB > 7 gm/dl  Full code  DVT & GI prophylaxis. Continue with supportive care Daughter at the bedside was update with the patient progress and plan of care.  Critical care time65min

## 2017-10-05 NOTE — Progress Notes (Signed)
Pharmacy Antibiotic Note  Michelle [name] is a 57 y.o. female admitted on 09/30/2017 with pneumonia.  Pharmacy has been consulted for Vancomycin dosing.  Patient received one dose of vancomycin 1000mg  once.   Plan: Vancomycin 500 IV every 12 hours.  Goal trough 15-20 mcg/mL.  Patient is also on Zosyn 3.375g [place] will continue to follow. Vancomycin trough has been ordered prior to 4th dose.   Ke= 0.052 Vd= 30.8 Cmin= 17 Stack dose @ 12 hours  Height: 4\' 9"  (144.8 cm) Weight: 115 lb 1.3 oz (52.2 kg) IBW/kg (Calculated) : 38.6  Temp (24hrs), Avg:99.3 F (37.4 C), Min:96.8 F (36 C), Max:101.1 F (38.4 C)  Recent Labs  Lab 09/30/17 1705 09/30/17 1706 09/30/17 2233 10/01/17 0417 10/02/17 0455 10/03/17 0426 10/04/17 0409  WBC 20.0*  --  20.4* 15.2* 8.2 5.0  --   CREATININE 0.89  --  1.30* 1.31* 1.04* 1.08* 0.76  LATICACIDVEN  --  0.8  --   --   --   --   --     Estimated Creatinine Clearance: 54.5 mL/min (by C-G formula based on SCr of 0.76 mg/dL).    Allergies  Allergen Reactions  . Carvedilol Other (See Comments)    Pt gets "sick" when on medication    Antimicrobials this admission: Levofloxacin 4/29 >> 4/30 Zosyn 4/30 >>  Vancomycin 5/4 >>    Dose adjustments this admission:   Microbiology results: BCx: 4/29 >> NGTD; Rpt >> sent  UCx: 4/29 >> NGTD Respiratory Cx 4/29 >> Mod WBC, few GPC in clusters, few GNR MRSA PCR: 4/29 >> NEGATIVE  Thank you for allowing pharmacy to be a part of this patient's care.  Lendon Ka, PharmD Pharmacy Resident 10/05/2017 12:23 PM

## 2017-10-05 NOTE — Progress Notes (Signed)
Patient being weaned this am off of fentayl. Precedex being continued. Patients blood pressure and heart rate elevated, labored breathing. Patient shaking her head yes when asked if she is having a difficult time breathing. Patients daughter and Respitory at bedside. Dr Soyla Murphy will come in to evaluate.

## 2017-10-05 NOTE — Progress Notes (Signed)
Dr Soyla Murphy notified he will come evaluate patient-

## 2017-10-05 NOTE — Progress Notes (Signed)
Restarted fentanyl

## 2017-10-06 ENCOUNTER — Inpatient Hospital Stay: Payer: Medicaid Other

## 2017-10-06 LAB — CBC WITH DIFFERENTIAL/PLATELET
Band Neutrophils: 2 %
Basophils Absolute: 0 10*3/uL (ref 0–0.1)
Basophils Relative: 0 %
Blasts: 0 %
EOS PCT: 0 %
Eosinophils Absolute: 0 10*3/uL (ref 0–0.7)
HCT: 31.3 % — ABNORMAL LOW (ref 35.0–47.0)
Hemoglobin: 10.2 g/dL — ABNORMAL LOW (ref 12.0–16.0)
LYMPHS ABS: 0.5 10*3/uL — AB (ref 1.0–3.6)
Lymphocytes Relative: 6 %
MCH: 29 pg (ref 26.0–34.0)
MCHC: 32.4 g/dL (ref 32.0–36.0)
MCV: 89.3 fL (ref 80.0–100.0)
METAMYELOCYTES PCT: 0 %
MONOS PCT: 3 %
Monocytes Absolute: 0.3 10*3/uL (ref 0.2–0.9)
Myelocytes: 1 %
NEUTROS ABS: 7.8 10*3/uL — AB (ref 1.4–6.5)
NEUTROS PCT: 88 %
NRBC: 2 /100{WBCs} — AB
OTHER: 0 %
PLATELETS: 189 10*3/uL (ref 150–440)
Promyelocytes Relative: 0 %
RBC: 3.51 MIL/uL — ABNORMAL LOW (ref 3.80–5.20)
RDW: 14.6 % — AB (ref 11.5–14.5)
WBC: 8.6 10*3/uL (ref 3.6–11.0)

## 2017-10-06 LAB — BLOOD GAS, ARTERIAL
ACID-BASE EXCESS: 6.1 mmol/L — AB (ref 0.0–2.0)
Bicarbonate: 33.1 mmol/L — ABNORMAL HIGH (ref 20.0–28.0)
FIO2: 0.28
Mechanical Rate: 16
O2 Saturation: 94.9 %
PEEP: 5 cmH2O
Patient temperature: 37
VT: 450 mL
pCO2 arterial: 60 mmHg — ABNORMAL HIGH (ref 32.0–48.0)
pH, Arterial: 7.35 (ref 7.350–7.450)
pO2, Arterial: 79 mmHg — ABNORMAL LOW (ref 83.0–108.0)

## 2017-10-06 LAB — MAGNESIUM: Magnesium: 1.7 mg/dL (ref 1.7–2.4)

## 2017-10-06 LAB — GLUCOSE, CAPILLARY
GLUCOSE-CAPILLARY: 140 mg/dL — AB (ref 65–99)
GLUCOSE-CAPILLARY: 146 mg/dL — AB (ref 65–99)
GLUCOSE-CAPILLARY: 165 mg/dL — AB (ref 65–99)
GLUCOSE-CAPILLARY: 177 mg/dL — AB (ref 65–99)
Glucose-Capillary: 131 mg/dL — ABNORMAL HIGH (ref 65–99)
Glucose-Capillary: 145 mg/dL — ABNORMAL HIGH (ref 65–99)

## 2017-10-06 LAB — PROTIME-INR
INR: 0.94
Prothrombin Time: 12.5 seconds (ref 11.4–15.2)

## 2017-10-06 LAB — PHOSPHORUS: Phosphorus: 2.5 mg/dL (ref 2.5–4.6)

## 2017-10-06 MED ORDER — MIDAZOLAM HCL 2 MG/2ML IJ SOLN
INTRAMUSCULAR | Status: AC
  Administered 2017-10-06: 2 mg via INTRAVENOUS
  Filled 2017-10-06: qty 2

## 2017-10-06 MED ORDER — MIDAZOLAM HCL 2 MG/2ML IJ SOLN
2.0000 mg | Freq: Once | INTRAMUSCULAR | Status: AC
  Administered 2017-10-06: 2 mg via INTRAVENOUS

## 2017-10-06 MED ORDER — SODIUM CHLORIDE 0.9 % IV SOLN
INTRAVENOUS | Status: DC
  Administered 2017-10-06: 11:00:00 via INTRAVENOUS
  Filled 2017-10-06: qty 20

## 2017-10-06 MED ORDER — SODIUM CHLORIDE 0.45 % IV SOLN
INTRAVENOUS | Status: DC
  Administered 2017-10-06: 11:00:00 via INTRAVENOUS

## 2017-10-06 MED ORDER — SODIUM CHLORIDE 0.9 % IV SOLN
0.1000 mg/kg/h | INTRAVENOUS | Status: DC
  Filled 2017-10-06: qty 2

## 2017-10-06 MED ORDER — THEOPHYLLINE 80 MG/15ML PO ELIX
200.0000 mg | ORAL_SOLUTION | Freq: Three times a day (TID) | ORAL | Status: DC
  Administered 2017-10-06 – 2017-10-07 (×2): 200 mg via ORAL
  Filled 2017-10-06 (×4): qty 37.5

## 2017-10-06 MED ORDER — DILTIAZEM HCL 25 MG/5ML IV SOLN
10.0000 mg | Freq: Once | INTRAVENOUS | Status: AC
  Administered 2017-10-06: 10 mg via INTRAVENOUS
  Filled 2017-10-06: qty 5

## 2017-10-06 NOTE — Progress Notes (Signed)
[facility] NAME: Michelle [name]    MR#:  [id number]  DATE OF BIRTH:  04-Nov-1960  SUBJECTIVE:  CHIEF COMPLAINT:   Chief Complaint  Patient presents with  . Shortness of Breath   - remains intubated, failed weaning trial, on sedation and on tube feeds -Daughters at bedside -Spiked fever yesterday  REVIEW OF SYSTEMS:  Review of Systems  Unable to perform ROS: Critical illness    DRUG ALLERGIES:   Allergies  Allergen Reactions  . Carvedilol Other (See Comments)    Pt gets "sick" when on medication    VITALS:  Blood pressure 124/87, pulse (!) 130, temperature (!) 101.1 F (38.4 C), temperature source Core, resp. rate 18, height 4\' 9"  (1.448 m), weight 53.5 kg (117 lb 15.1 oz), last menstrual period 03/06/2005, SpO2 94 %.  PHYSICAL EXAMINATION:  Physical Exam  GENERAL:  57 y.o.-year-old patient lying in the bed with no acute distress.  EYES: Pupils equal, round, reactive to light and accommodation. No scleral icterus. Extraocular muscles intact.  HEENT: Head atraumatic, normocephalic. Oropharynx and nasopharynx clear.orally intubated and has OG tube  NECK:  Supple, no jugular venous distention. No thyroid enlargement, no tenderness.  LUNGS: Normal breath sounds bilaterally, no wheezing, rales,rhonchi or crepitation. No use of accessory muscles of respiration. Decreased bibasilar breath sounds CARDIOVASCULAR: S1, S2 normal. No murmurs, rubs, or gallops.  ABDOMEN: Soft, nontender, nondistended. Bowel sounds present. No organomegaly or mass.  EXTREMITIES: No pedal edema, cyanosis, or clubbing. 1-2+ edema of hands edema NEUROLOGIC: intubated and sedated  PSYCHIATRIC: The patient is sedated.  SKIN: No obvious rash, lesion, or ulcer.    LABORATORY PANEL:   CBC Recent Labs  Lab 10/06/17 0510  WBC 8.6  HGB 10.2*  HCT 31.3*  PLT 189    ------------------------------------------------------------------------------------------------------------------  Chemistries  Recent Labs  Lab 09/30/17 1705  10/04/17 0409 10/06/17 0510  NA 143   < > 140  --   K 4.0   < > 4.2  --   CL 103   < > 107  --   CO2 31   < > 25  --   GLUCOSE 110*   < > 118*  --   BUN 37*   < > 39*  --   CREATININE 0.89   < > 0.76  --   CALCIUM 8.9   < > 8.3*  --   MG  --    < >  --  1.7  AST 26  --   --   --   ALT 26  --   --   --   ALKPHOS 46  --   --   --   BILITOT 0.4  --   --   --    < > = values in this interval not displayed.   ------------------------------------------------------------------------------------------------------------------  Cardiac Enzymes Recent Labs  Lab 10/01/17 0417  TROPONINI 0.04*   ------------------------------------------------------------------------------------------------------------------  RADIOLOGY:  Dg Abd 1 View  Result Date: 10/06/2017 CLINICAL DATA:  Initial evaluation for ileus. EXAM: ABDOMEN - 1 VIEW COMPARISON:  Prior radiograph from 10/04/2017. FINDINGS: Enteric tube in place with tip overlying the gastric body, side hole at or just beyond the GE junction. Few mildly prominent gas-filled loops of bowel overlie the mid and left abdomen without overt evidence for obstruction. No abnormal bowel wall thickening. No soft tissue mass or abnormal calcification. Tubal ligation clips overlie the pelvis. Visualized lung bases are clear. IMPRESSION: 1. Few  mildly prominent gas-filled loops of bowel overlying the mid and left abdomen without evidence for obstruction. 2. Enteric tube in place with tip overlying the gastric body, side hole at or just beyond the level of the GE junction. Electronically Signed   By: Jeannine Boga M.D.   On: 10/06/2017 13:13   Dg Chest Port 1 View  Result Date: 10/06/2017 CLINICAL DATA:  Pneumonia EXAM: PORTABLE CHEST 1 VIEW COMPARISON:  10/04/2017 chest radiograph. FINDINGS:  Endotracheal tube tip is 6.3 cm above the carina. Enteric tube enters stomach with the tip not seen on this image. Right PICC terminates in the lower third of the SVC. Stable cardiomediastinal silhouette with normal heart size. No pneumothorax. No pleural effusion. Hyperinflated lungs. No pulmonary edema. Mild left basilar scarring versus atelectasis. No acute consolidative airspace disease. IMPRESSION: 1. Well-positioned support structures as detailed. 2. Hyperinflated lungs, suggesting COPD. 3. Stable mild left basilar scarring versus atelectasis. Electronically Signed   By: Ilona Sorrel M.D.   On: 10/06/2017 07:15   [place] Ekg Site Rite  Result Date: 10/04/2017 If Site Rite image not attached, placement could not be confirmed due to current cardiac rhythm.   EKG:   Orders placed or performed during the hospital encounter of 09/30/17  . EKG 12-Lead  . EKG 12-Lead  . EKG 12-Lead  . EKG 12-Lead    ASSESSMENT AND PLAN:   [name] a57 y.o.femalewith a known history of anxiety, CHF, cocaine abuse, COPD, coronary artery disease, hepatitis C, hypertension admitted for acute respiratory failure and intubated.  1.  Acute on chronic respiratory failure with hypoxia-secondary to acute on chronic COPD exacerbation -Remains on vent, continue weaning trial as tolerated -Appreciate pulmonary management-ketamine added for bronchodilatory effect -On steroids, nebs and Zosyn for bronchitis/pneumonia -Vancomycin added yesterday for a febrile episode  2.  Hypotension-secondary to sedation medications.  Off pressors now.  3.  Anxiety-continue Xanax scheduled.  Also on Precedex and risperidone  4.  DVT prophylaxis-subcutaneous heparin   Daughters updated at bedside.   All the records are reviewed and case discussed with Care Management/Social Workerr. Management plans discussed with the patient, family and they are in agreement.  CODE STATUS: Full Code  TOTAL TIME TAKING CARE OF THIS  PATIENT: 28 minutes.   POSSIBLE D/C IN ? DAYS, DEPENDING ON CLINICAL CONDITION.   Gladstone Lighter M.D on 10/06/2017 at 1:36 PM  Between 7am to 6pm - Pager - [phone]  After 6pm go to [email or web address] - password [email or web address] [facility]  Office  [phone]  CC: Primary care physician; Center, [facility]

## 2017-10-06 NOTE — Progress Notes (Signed)
Pt with repeated events of tachycardia sustaining to 160s, HTN with sys in 200s.  Dr Soyla Murphy ordered 10 of cardizem with fent bolus->HR responded to low 100s. HR increased again maintaining in 160s-> EKG ordered, 2 of versed and increased sedation, Ketamine 0.1mg  turned off. Pt HR is now stable in the 80s. Dr Soyla Murphy communicated that it could be d/t theophylline that was ordered yesterday->theophylline level sent to lab and dose HELD today.

## 2017-10-06 NOTE — Progress Notes (Signed)
Pharmacy Antibiotic Note  Michelle [name] is a 57 y.o. female admitted on 09/30/2017 with pneumonia.  Pharmacy has been consulted for Vancomycin dosing.  Patient received one dose of vancomycin 1000mg  once.   Plan: Vancomycin 500 IV every 12 hours.  Goal trough 15-20 mcg/mL.  Patient is also on Zosyn 3.375g [place] will continue to follow. Vancomycin trough has been ordered prior to 4th dose.   Ke= 0.052 Vd= 30.8 Cmin= 17 Stack dose @ 12 hours  Height: 4\' 9"  (144.8 cm) Weight: 117 lb 15.1 oz (53.5 kg) IBW/kg (Calculated) : 38.6  Temp (24hrs), Avg:98.4 F (36.9 C), Min:96.8 F (36 C), Max:101.1 F (38.4 C)  Recent Labs  Lab 09/30/17 1706 09/30/17 2233 10/01/17 0417 10/02/17 0455 10/03/17 0426 10/04/17 0409 10/05/17 1148 10/05/17 1305 10/06/17 0510  WBC  --  20.4* 15.2* 8.2 5.0  --   --   --  8.6  CREATININE  --  1.30* 1.31* 1.04* 1.08* 0.76  --   --   --   LATICACIDVEN 0.8  --   --   --   --   --  0.9 0.8  --     Estimated Creatinine Clearance: 55.3 mL/min (by C-G formula based on SCr of 0.76 mg/dL).    Allergies  Allergen Reactions  . Carvedilol Other (See Comments)    Pt gets "sick" when on medication    Antimicrobials this admission: Levofloxacin 4/29 >> 4/30 Zosyn 4/30 >>  Vancomycin 5/4 >>    Dose adjustments this admission:   Microbiology results: BCx: 4/29 >> NGTD; Rpt >> sent  UCx: 4/29 >> NGTD Respiratory Cx 4/29 >> Mod WBC, few GPC in clusters, few GNR MRSA PCR: 4/29 >> NEGATIVE  Thank you for allowing pharmacy to be a part of this patient's care.  Chinita Greenland PharmD Clinical Pharmacist 10/06/2017

## 2017-10-06 NOTE — Progress Notes (Signed)
Name: Michelle [name] MRN: [id number] DOB: 1960-11-29     CONSULTATION DATE: 09/30/2017  Subjective & Objective: Continues to have sever bronchospasm, was started on Theophylline on 10/05/2017.  PAST MEDICAL HISTORY :   has a past medical history of Allergy, Anxiety, Asthma, CHF (congestive heart failure) ([place]), Cocaine abuse ([place]), COPD (chronic obstructive pulmonary disease) ([place]), Coronary artery disease, Emphysema/COPD ([place]), Hepatitis C, High cholesterol, Hypertension, Pneumothorax, and Tobacco abuse.  has a past surgical history that includes Cardiac catheterization and Chest tube insertion. Prior to Admission medications   Medication Sig Start Date End Date Taking? Authorizing Provider  albuterol (PROVENTIL HFA;VENTOLIN HFA) 108 (90 Base) MCG/ACT inhaler Inhale 2 puffs into the lungs every 6 (six) hours as needed for wheezing or shortness of breath. 02/26/17  Yes Dustin Flock, MD  ALPRAZolam Duanne Moron) 0.5 MG tablet Take 1 tablet (0.5 mg total) by mouth 3 (three) times daily as needed for anxiety. 08/24/17  Yes Bettey Costa, MD  aspirin EC 81 MG tablet Take 81 mg by mouth daily.   Yes [provider]  ipratropium-albuterol (DUONEB) 0.5-2.5 (3) MG/3ML SOLN Take 3 mLs by nebulization every 6 (six) hours as needed. 01/04/17  Yes [name], MD  mirtazapine (REMERON) 30 MG tablet Take 30 mg by mouth at bedtime.   Yes [provider]  predniSONE (DELTASONE) 10 MG tablet Take 1 tablet (10 mg total) by mouth daily with breakfast. 08/24/17  Yes Mody, Sital, MD  SYMBICORT 160-4.5 MCG/ACT inhaler Take 2 puffs by mouth 2 (two) times daily.   Yes [provider]  tiotropium (SPIRIVA) 18 MCG inhalation capsule Place 1 capsule (18 mcg total) into inhaler and inhale daily. 01/04/17  Yes [name], MD  carvedilol (COREG) 3.125 MG tablet Take 1 tablet (3.125 mg total) by mouth every morning. Patient not taking: Reported on 09/30/2017 02/10/16   Gladstone Lighter,  MD  enalapril (VASOTEC) 10 MG tablet Take 0.5 tablets (5 mg total) by mouth daily. Patient not taking: Reported on 09/30/2017 02/10/16   Gladstone Lighter, MD  feeding supplement, ENSURE ENLIVE, (ENSURE ENLIVE) LIQD Take 237 mLs by mouth 2 (two) times daily between meals. 08/24/17   Bettey Costa, MD  guaiFENesin-codeine 100-10 MG/5ML syrup Take 10 mLs by mouth every 4 (four) hours as needed for cough. Patient not taking: Reported on 08/20/2017 07/02/17   Demetrios Loll, MD  levofloxacin (LEVAQUIN) 750 MG tablet Take 1 tablet (750 mg total) by mouth daily. Patient not taking: Reported on 09/30/2017 08/24/17   Bettey Costa, MD  polyethylene glycol (GOLYTELY) 236 g solution Drink one 8 oz glass every 20 mins until stools are clear starting at 5:00pm on 08/14/17 Patient not taking: Reported on 09/30/2017 07/26/17   Lucilla Lame, MD   Allergies  Allergen Reactions  . Carvedilol Other (See Comments)    Pt gets "sick" when on medication    FAMILY HISTORY:  family history includes Asthma in her mother; Breast cancer (age of onset: 54) in her maternal aunt; CAD in her father; COPD in her sister; CVA in her father; Lung cancer in her mother; Stroke in her father. SOCIAL HISTORY:  reports that she has been smoking cigarettes.  She has a 4.10 pack-year smoking history. She has never used smokeless tobacco. She reports that she has current or past drug history. Drug: "Crack" cocaine. She reports that she does not drink alcohol.  REVIEW OF SYSTEMS:   Unable to obtain due to critical illness   VITAL SIGNS: Temp:  [96.8 F (36  C)-99.7 F (37.6 C)] 98.1 F (36.7 C) (05/05 0400) Pulse Rate:  [61-124] 81 (05/05 0600) Resp:  [10-23] 10 (05/05 0600) BP: (96-203)/(69-110) 104/76 (05/05 0600) SpO2:  [93 %-100 %] 97 % (05/05 0700) FiO2 (%):  [28 %-30 %] 30 % (05/05 0700) Weight:  [53.5 kg (117 lb 15.1 oz)-56.5 kg (124 lb 9 oz)] 53.5 kg (117 lb 15.1 oz) (05/05 0405)   Physical Examination:  RASS of -2,following  commandsand non focal neuro exam On vent,moderatedistress,poor air movement bilanddiffuse expiratory wheezes.  S1 & S2 audible and no murmur Benign abdomen with normal peristalses Ext: wasted and no edema  ASSESSMENT / PLAN:  Acute on chronic respiratory failure with base line home O2 2 L/min and steroid dependent (Prednisone 10 mg PO daily). Anxiety is better controlled.Discused with pharmacy titration Ketamine for sedation with the benefit of strong bronchodilation. Optimize anxiolytics Xanax + Risperdal and continue with vent support  COPD exacerbation, continues to have sever bronchospasm with poor air movement. -Bronchodilators + inhaled steroids + ABX + tapering systemic steroids -OptimizeTheophylline, monitor HR and therapeutic leve.  Pneumonia (Sput-ve). Spiked fever -c/wZosy and start on Vanc. -Follow with repeat Blood and tracheal aspirate cultures. -Monitor CXR + CBC + FIO2. -Consider de-escalation of ABX with clinical improvement.  AKI(improved)due to renal hypoperfusion with hypotension. Prerenal azotemia due to negative catabolic effect of steroids. -Avoid nephrotoxins,optimize steroids,monitor renal panel and urine out put.  Cocaine intoxication -Supportive care  Hypotension secondary to meds (resolved). Off Levophed -Monitor hemodynamics  Elevated CI with demand vs supply mismatch. H/o CAD, ECHO 2017 LV EF 60-65% -Monitor EKG, ECHO and CI. -ASA + BB as tolerated and consider cardiology consult.  HTN -Optimize antihypertensives and monitor hemodynamics. Dyslipidemia  Anxiety -Xanax+ Risperdal  Hypertriglyceridemia with Propofol -D/C Propofol and monitor TG.  Small bleeding around lines. HB stable -Monitor Hb and coags.  Anemia -Keep HB > 7 gm/dl  Full code  DVT & GI prophylaxis. Continue with supportive care Daughter at the bedside was update with the patient progress and plan of care.  Critical care  time70min

## 2017-10-07 ENCOUNTER — Inpatient Hospital Stay: Payer: Medicaid Other

## 2017-10-07 DIAGNOSIS — J9601 Acute respiratory failure with hypoxia: Secondary | ICD-10-CM

## 2017-10-07 LAB — CBC WITH DIFFERENTIAL/PLATELET
BAND NEUTROPHILS: 1 %
Basophils Absolute: 0 10*3/uL (ref 0–0.1)
Basophils Relative: 0 %
Blasts: 0 %
EOS ABS: 0 10*3/uL (ref 0–0.7)
Eosinophils Relative: 0 %
HEMATOCRIT: 29.1 % — AB (ref 35.0–47.0)
HEMOGLOBIN: 9.4 g/dL — AB (ref 12.0–16.0)
LYMPHS PCT: 7 %
Lymphs Abs: 0.6 10*3/uL — ABNORMAL LOW (ref 1.0–3.6)
MCH: 29 pg (ref 26.0–34.0)
MCHC: 32.4 g/dL (ref 32.0–36.0)
MCV: 89.7 fL (ref 80.0–100.0)
MONOS PCT: 2 %
Metamyelocytes Relative: 1 %
Monocytes Absolute: 0.2 10*3/uL (ref 0.2–0.9)
Myelocytes: 1 %
NEUTROS PCT: 88 %
NRBC: 0 /100{WBCs}
Neutro Abs: 7.4 10*3/uL — ABNORMAL HIGH (ref 1.4–6.5)
OTHER: 0 %
PROMYELOCYTES RELATIVE: 0 %
Platelets: 176 10*3/uL (ref 150–440)
RBC: 3.25 MIL/uL — AB (ref 3.80–5.20)
RDW: 14.6 % — AB (ref 11.5–14.5)
WBC: 8.2 10*3/uL (ref 3.6–11.0)

## 2017-10-07 LAB — BASIC METABOLIC PANEL
Anion gap: 5 (ref 5–15)
BUN: 28 mg/dL — ABNORMAL HIGH (ref 6–20)
CHLORIDE: 102 mmol/L (ref 101–111)
CO2: 33 mmol/L — AB (ref 22–32)
CREATININE: 0.6 mg/dL (ref 0.44–1.00)
Calcium: 8.5 mg/dL — ABNORMAL LOW (ref 8.9–10.3)
GFR calc Af Amer: >60 mL/min (ref >60)
GFR calc non Af Amer: >60 mL/min (ref >60)
Glucose, Bld: 178 mg/dL — ABNORMAL HIGH (ref 65–99)
POTASSIUM: 4.6 mmol/L (ref 3.5–5.1)
Sodium: 140 mmol/L (ref 135–145)

## 2017-10-07 LAB — GLUCOSE, CAPILLARY
GLUCOSE-CAPILLARY: 181 mg/dL — AB (ref 65–99)
GLUCOSE-CAPILLARY: 149 mg/dL — AB (ref 65–99)
GLUCOSE-CAPILLARY: 116 mg/dL — AB (ref 65–99)
Glucose-Capillary: 133 mg/dL — ABNORMAL HIGH (ref 65–99)
Glucose-Capillary: 130 mg/dL — ABNORMAL HIGH (ref 65–99)
Glucose-Capillary: 120 mg/dL — ABNORMAL HIGH (ref 65–99)

## 2017-10-07 LAB — PHOSPHORUS: Phosphorus: 3.1 mg/dL (ref 2.5–4.6)

## 2017-10-07 LAB — MAGNESIUM: Magnesium: 1.9 mg/dL (ref 1.7–2.4)

## 2017-10-07 LAB — VANCOMYCIN, TROUGH: Vancomycin Tr: 10 ug/mL — ABNORMAL LOW (ref 15–20)

## 2017-10-07 LAB — TRIGLYCERIDES: Triglycerides: 141 mg/dL (ref <150)

## 2017-10-07 MED ORDER — VECURONIUM BROMIDE 10 MG IV SOLR
10.0000 mg | Freq: Once | INTRAVENOUS | Status: AC
  Administered 2017-10-07: 10 mg via INTRAVENOUS

## 2017-10-07 MED ORDER — FENTANYL NICU BOLUS VIA INFUSION: 75.0000 ug | INTRAVENOUS | Status: DC | PRN

## 2017-10-07 MED ORDER — FENTANYL BOLUS VIA INFUSION
75.0000 ug | INTRAVENOUS | Status: DC
  Filled 2017-10-07 (×3): qty 100

## 2017-10-07 MED ORDER — VECURONIUM BROMIDE 10 MG IV SOLR
INTRAVENOUS | Status: AC
  Administered 2017-10-07: 10 mg via INTRAVENOUS
  Filled 2017-10-07: qty 10

## 2017-10-07 MED ORDER — FAMOTIDINE 20 MG PO TABS
20.0000 mg | ORAL_TABLET | Freq: Once | ORAL | Status: AC
  Administered 2017-10-07: 20 mg via ORAL
  Filled 2017-10-07: qty 1

## 2017-10-07 MED ORDER — SODIUM CHLORIDE 0.9 % IV SOLN
0.5000 mg/h | INTRAVENOUS | Status: DC
  Filled 2017-10-07: qty 10

## 2017-10-07 MED ORDER — SODIUM CHLORIDE 0.9 % IV SOLN
500.0000 mg | Freq: Three times a day (TID) | INTRAVENOUS | Status: DC
  Administered 2017-10-07: 500 mg via INTRAVENOUS
  Filled 2017-10-07 (×3): qty 500

## 2017-10-07 MED ORDER — PROPOFOL 1000 MG/100ML IV EMUL
5.0000 ug/kg/min | INTRAVENOUS | Status: DC
  Administered 2017-10-07: 20 ug/kg/min via INTRAVENOUS
  Administered 2017-10-07: 70 ug/kg/min via INTRAVENOUS
  Administered 2017-10-08 (×2): 80 ug/kg/min via INTRAVENOUS
  Administered 2017-10-08 (×2): 70 ug/kg/min via INTRAVENOUS
  Administered 2017-10-08 – 2017-10-09 (×3): 80 ug/kg/min via INTRAVENOUS
  Administered 2017-10-09: 40 ug/kg/min via INTRAVENOUS
  Filled 2017-10-07 (×15): qty 100

## 2017-10-07 MED ORDER — FAMOTIDINE 20 MG PO TABS
20.0000 mg | ORAL_TABLET | Freq: Two times a day (BID) | ORAL | Status: DC
  Administered 2017-10-07 – 2017-10-16 (×19): 20 mg
  Filled 2017-10-07 (×19): qty 1

## 2017-10-07 MED ORDER — STERILE WATER FOR INJECTION IJ SOLN
INTRAMUSCULAR | Status: AC
  Administered 2017-10-07: 10 mL
  Filled 2017-10-07: qty 10

## 2017-10-07 MED ORDER — FENTANYL BOLUS VIA INFUSION
75.0000 ug | INTRAVENOUS | Status: DC | PRN
  Administered 2017-10-07 (×6): 100 ug via INTRAVENOUS
  Administered 2017-10-10: 75 ug via INTRAVENOUS
  Administered 2017-10-14 – 2017-10-15 (×6): 100 ug via INTRAVENOUS
  Administered 2017-10-16: 75 ug via INTRAVENOUS
  Administered 2017-10-16 – 2017-10-17 (×5): 100 ug via INTRAVENOUS
  Administered 2017-10-17: 75 ug via INTRAVENOUS
  Filled 2017-10-07: qty 100

## 2017-10-07 MED ORDER — MIDAZOLAM HCL 2 MG/2ML IJ SOLN
INTRAMUSCULAR | Status: AC
  Administered 2017-10-07: 2 mg via INTRAVENOUS
  Filled 2017-10-07: qty 2

## 2017-10-07 MED ORDER — MIDAZOLAM HCL 2 MG/2ML IJ SOLN
2.0000 mg | Freq: Once | INTRAMUSCULAR | Status: AC
  Administered 2017-10-07: 2 mg via INTRAVENOUS

## 2017-10-07 NOTE — Progress Notes (Signed)
Pharmacy Antibiotic Note  Michelle [name] is a 57 y.o. female admitted on 09/30/2017 with pneumonia.  Pharmacy has been consulted for Vancomycin dosing.  Patient received one dose of vancomycin 1000mg  once.   Plan: Vancomycin 500 IV every 12 hours.  Goal trough 15-20 mcg/mL.  Patient is also on Zosyn 3.375g [place] will continue to follow. Vancomycin trough has been ordered prior to 4th dose.   Ke= 0.052 Vd= 30.8 Cmin= 17 Stack dose @ 12 hours  Height: 4\' 9"  (144.8 cm) Weight: 114 lb 13.8 oz (52.1 kg) IBW/kg (Calculated) : 38.6  Temp (24hrs), Avg:98.5 F (36.9 C), Min:96.1 F (35.6 C), Max:101.1 F (38.4 C)  Recent Labs  Lab 09/30/17 1706 09/30/17 2233 10/01/17 0417 10/02/17 0455 10/03/17 0426 10/04/17 0409 10/05/17 1148 10/05/17 1305 10/06/17 0510 10/07/17 0010  WBC  --  20.4* 15.2* 8.2 5.0  --   --   --  8.6  --   CREATININE  --  1.30* 1.31* 1.04* 1.08* 0.76  --   --   --   --   LATICACIDVEN 0.8  --   --   --   --   --  0.9 0.8  --   --   VANCOTROUGH  --   --   --   --   --   --   --   --   --  10*    Estimated Creatinine Clearance: 54.5 mL/min (by C-G formula based on SCr of 0.76 mg/dL).    Allergies  Allergen Reactions  . Carvedilol Other (See Comments)    Pt gets "sick" when on medication    Antimicrobials this admission: Levofloxacin 4/29 >> 4/30 Zosyn 4/30 >>  Vancomycin 5/4 >>    Dose adjustments this admission: 5/6 0000 vanc level 10. Changed to 500 mg q 8 hours. Level before 4th new dose.  Microbiology results: BCx: 4/29 >> NGTD; Rpt >> sent  UCx: 4/29 >> NGTD Respiratory Cx 4/29 >> Mod WBC, few GPC in clusters, few GNR MRSA PCR: 4/29 >> NEGATIVE  Thank you for allowing pharmacy to be a part of this patient's care.  Chinita Greenland PharmD Clinical Pharmacist 10/07/2017

## 2017-10-07 NOTE — Progress Notes (Signed)
[facility] NAME: Michelle [name]    MR#:  [id number]  DATE OF BIRTH:  [birth year]  SUBJECTIVE:  CHIEF COMPLAINT:   Chief Complaint  Patient presents with  . Shortness of Breath   - remains intubated, failed weaning trial, on sedation and on tube feeds -Still spiking fevers.  Needed more sedation this morning due to tachypnea and tachycardia  REVIEW OF SYSTEMS:  Review of Systems  Unable to perform ROS: Critical illness    DRUG ALLERGIES:   Allergies  Allergen Reactions  . Carvedilol Other (See Comments)    Pt gets "sick" when on medication    VITALS:  Blood pressure (!) 89/61, pulse 81, temperature 100.2 F (37.9 C), temperature source Bladder, resp. rate 16, height 4\' 9"  (1.448 m), weight 52.1 kg (114 lb 13.8 oz), last menstrual period 03/06/2005, SpO2 97 %.  PHYSICAL EXAMINATION:  Physical Exam  GENERAL:  57 y.o.-year-old patient lying in the bed with no acute distress.  EYES: Pupils equal, round, reactive to light and accommodation. No scleral icterus. Extraocular muscles intact.  HEENT: Head atraumatic, normocephalic. Oropharynx and nasopharynx clear.orally intubated and has OG tube  NECK:  Supple, no jugular venous distention. No thyroid enlargement, no tenderness.  LUNGS: Normal breath sounds bilaterally, no wheezing, rales,rhonchi or crepitation. No use of accessory muscles of respiration. Decreased bibasilar breath sounds CARDIOVASCULAR: S1, S2 normal. No murmurs, rubs, or gallops.  ABDOMEN: Soft, nontender, nondistended.  Hypoactive bowel sounds present. No organomegaly or mass.  EXTREMITIES: No cyanosis, or clubbing. 1-2+ edema of hands and feet edema NEUROLOGIC: intubated and sedated.  Restless movements in bed noted PSYCHIATRIC: The patient is sedated.  SKIN: No obvious rash, lesion, or ulcer.    LABORATORY PANEL:   CBC Recent Labs  Lab 10/07/17 0558  WBC 8.2  HGB 9.4*  HCT 29.1*  PLT 176    ------------------------------------------------------------------------------------------------------------------  Chemistries  Recent Labs  Lab 09/30/17 1705  10/07/17 0558  NA 143   < > 140  K 4.0   < > 4.6  CL 103   < > 102  CO2 31   < > 33*  GLUCOSE 110*   < > 178*  BUN 37*   < > 28*  CREATININE 0.89   < > 0.60  CALCIUM 8.9   < > 8.5*  MG  --    < > 1.9  AST 26  --   --   ALT 26  --   --   ALKPHOS 46  --   --   BILITOT 0.4  --   --    < > = values in this interval not displayed.   ------------------------------------------------------------------------------------------------------------------  Cardiac Enzymes Recent Labs  Lab 10/01/17 0417  TROPONINI 0.04*   ------------------------------------------------------------------------------------------------------------------  RADIOLOGY:  Dg Abd 1 View  Result Date: 10/06/2017 CLINICAL DATA:  Initial evaluation for ileus. EXAM: ABDOMEN - 1 VIEW COMPARISON:  Prior radiograph from 10/04/2017. FINDINGS: Enteric tube in place with tip overlying the gastric body, side hole at or just beyond the GE junction. Few mildly prominent gas-filled loops of bowel overlie the mid and left abdomen without overt evidence for obstruction. No abnormal bowel wall thickening. No soft tissue mass or abnormal calcification. Tubal ligation clips overlie the pelvis. Visualized lung bases are clear. IMPRESSION: 1. Few mildly prominent gas-filled loops of bowel overlying the mid and left abdomen without evidence for obstruction. 2. Enteric tube in place with tip overlying the gastric body,  side hole at or just beyond the level of the GE junction. Electronically Signed   By: Jeannine Boga M.D.   On: 10/06/2017 13:13   Dg Chest Port 1 View  Result Date: 10/07/2017 CLINICAL DATA:  Atelectasis. EXAM: PORTABLE CHEST 1 VIEW COMPARISON:  Oct 06, 2017 FINDINGS: The ETT is in good position. The NG tube terminates below today's film. A right PICC line  terminates in the central SVC, stable. No pneumothorax. The cardiomediastinal silhouette is normal. Haziness in the medial right lung base may represent vascular crowding/overlying soft tissue given positioning. Infiltrate considered less likely. No other interval changes. IMPRESSION: 1. Support apparatus as above. 2. There is haziness over the medial right lung base. I suspect this is either vascular crowding or overlying soft tissue attenuation given positioning. Subtle infiltrate considered less likely. Recommend attention on follow-up. Electronically Signed   By: Dorise Bullion III M.D   On: 10/07/2017 09:01   Dg Chest Port 1 View  Result Date: 10/06/2017 CLINICAL DATA:  Pneumonia EXAM: PORTABLE CHEST 1 VIEW COMPARISON:  10/04/2017 chest radiograph. FINDINGS: Endotracheal tube tip is 6.3 cm above the carina. Enteric tube enters stomach with the tip not seen on this image. Right PICC terminates in the lower third of the SVC. Stable cardiomediastinal silhouette with normal heart size. No pneumothorax. No pleural effusion. Hyperinflated lungs. No pulmonary edema. Mild left basilar scarring versus atelectasis. No acute consolidative airspace disease. IMPRESSION: 1. Well-positioned support structures as detailed. 2. Hyperinflated lungs, suggesting COPD. 3. Stable mild left basilar scarring versus atelectasis. Electronically Signed   By: Ilona Sorrel M.D.   On: 10/06/2017 07:15    EKG:   Orders placed or performed during the hospital encounter of 09/30/17  . EKG 12-Lead  . EKG 12-Lead  . EKG 12-Lead  . EKG 12-Lead    ASSESSMENT AND PLAN:   [name] a57 y.o.femalewith a known history of anxiety, CHF, cocaine abuse, COPD, coronary artery disease, hepatitis C, hypertension admitted for acute respiratory failure and intubated.  1.  Acute on chronic respiratory failure with hypoxia-secondary to acute on chronic COPD exacerbation -Remains on vent, continue weaning trial as  tolerated -Appreciate pulmonary management-ketamine added  -On steroids, nebs and Zosyn for bronchitis/pneumonia -Vancomycin added for fevers, repeat chest x-ray with possible right base infiltrate - If unable to be weaned- consider trach- 7 days on vent  2.  Hypotension-secondary to sedation medications.  Not receiving levophed now.  3.  Anxiety-continue Xanax scheduled.  Also on Precedex, versed added and risperidone  4.  DVT prophylaxis-subcutaneous heparin     All the records are reviewed and case discussed with Care Management/Social Workerr. Management plans discussed with the patient, family and they are in agreement.  CODE STATUS: Full Code  TOTAL TIME TAKING CARE OF THIS PATIENT: 28 minutes.   POSSIBLE D/C IN ? DAYS, DEPENDING ON CLINICAL CONDITION.   Gladstone Lighter M.D on 10/07/2017 at 9:57 AM  Between 7am to 6pm - Pager - [phone]  After 6pm go to [email or web address] - password [email or web address] [facility]  Office  [phone]  CC: Primary care physician; Center, [facility]

## 2017-10-07 NOTE — Progress Notes (Signed)
Follow up - Critical Care Medicine Note  Patient Details:    Michelle [name] is an 57 y.o. female.with a known history of anxiety, CHF, cocaine abuse, COPD, coronary artery disease, hepatitis C, hypertension, intubated on mechanical ventilation.   Lines, Airways, Drains: Airway 7.5 mm (Active)  Secured at (cm) 21 cm 10/07/2017  8:00 AM  Measured From Lips 10/07/2017  8:00 AM  Secured Location Left 10/07/2017  8:00 AM  Secured By [employer] 10/07/2017  8:00 AM  Tube Holder Repositioned Yes 10/07/2017  8:00 AM  Cuff Pressure (cm H2O) 25 cm H2O 10/07/2017  8:00 AM  Site Condition Dry 10/07/2017  8:00 AM     PICC Triple Lumen 10/04/17 PICC Right Brachial 36 cm 0 cm (Active)  Indication for Insertion or Continuance of Line Prolonged intravenous therapies 10/07/2017  7:44 AM  Exposed Catheter (cm) 0 cm 10/04/2017  5:00 PM  Site Assessment Clean;Dry;Intact 10/07/2017  7:30 AM  Lumen #1 Status Infusing;Flushed;Blood return noted 10/07/2017  7:30 AM  Lumen #2 Status Infusing;Flushed;Blood return noted 10/07/2017  7:30 AM  Lumen #3 Status Infusing;Flushed;Blood return noted 10/07/2017  7:30 AM  Dressing Type Transparent 10/07/2017  7:30 AM  Dressing Status Intact;Dry;Clean;Antimicrobial disc in place 10/07/2017  7:30 AM  Line Care Connections checked and tightened 10/07/2017  7:30 AM  Line Adjustment (NICU/IV Team Only) No 10/06/2017  2:15 PM  Dressing Intervention Dressing changed;Antimicrobial disc changed 10/07/2017  4:02 AM  Dressing Change Due 10/11/17 10/07/2017  7:30 AM     NG/OG Tube Orogastric 14 Fr. Left mouth Aucultation Documented cm marking at nare/ corner of mouth 55 cm (Active)  Cm Marking at Nare/Corner of Mouth (if applicable) 55 cm 5/0/3546  7:30 AM  External Length of Tube (cm) - (if applicable) 55 cm 10/07/8125  8:03 PM  Site Assessment Clean;Dry;Intact 10/07/2017  7:30 AM  Ongoing Placement Verification No change in respiratory status;No acute changes, not attributed to clinical condition;Xray;No  change in cm markings or external length of tube from initial placement 10/07/2017  7:30 AM  Status Infusing tube feed 10/07/2017  7:30 AM  Intake (mL) 40 mL 10/06/2017  2:00 PM     Urethral Catheter ANNA AND STUDENT CNA Latex 16 Fr. (Active)  Indication for Insertion or Continuance of Catheter Other (comment) 10/07/2017  7:30 AM  Site Assessment Clean;Intact;Dry 10/07/2017  7:30 AM  Catheter Maintenance Bag below level of bladder;Catheter secured;Drainage bag/tubing not touching floor;Insertion date on drainage bag;No dependent loops;Seal intact 10/07/2017  7:44 AM  Collection Container Standard drainage bag 10/07/2017  7:30 AM  Securement Method Securing device (Describe) 10/07/2017  7:30 AM  Urinary Catheter Interventions Unclamped 10/07/2017  7:30 AM  Output (mL) 550 mL 10/07/2017  8:00 AM    Anti-infectives:  Anti-infectives (From admission, onward)   Start     Dose/Rate Route Frequency Ordered Stop   10/07/17 0900  vancomycin (VANCOCIN) 500 mg in sodium chloride 0.9 % 100 mL IVPB  Status:  Discontinued     500 mg 100 mL/hr over 60 Minutes Intravenous Every 8 hours 10/07/17 0343 10/07/17 1033   10/06/17 0100  vancomycin (VANCOCIN) 500 mg in sodium chloride 0.9 % 100 mL IVPB  Status:  Discontinued     500 mg 100 mL/hr over 60 Minutes Intravenous Every 12 hours 10/05/17 1540 10/07/17 0343   10/05/17 1145  vancomycin (VANCOCIN) IVPB 1000 mg/200 mL premix     1,000 mg 200 mL/hr over 60 Minutes Intravenous  Once 10/05/17 1132 10/05/17 1401  10/01/17 2200  levofloxacin (LEVAQUIN) IVPB 750 mg  Status:  Discontinued     750 mg 100 mL/hr over 90 Minutes Intravenous Every 48 hours 10/01/17 2150 10/02/17 1054   10/01/17 1800  levofloxacin (LEVAQUIN) IVPB 500 mg  Status:  Discontinued     500 mg 100 mL/hr over 60 Minutes Intravenous Daily-1800 09/30/17 1853 10/01/17 0804   10/01/17 0900  piperacillin-tazobactam (ZOSYN) IVPB 3.375 g     3.375 g 12.5 mL/hr over 240 Minutes Intravenous Every 8 hours 10/01/17  0804 10/08/17 0859   09/30/17 1700  levofloxacin (LEVAQUIN) IVPB 750 mg     750 mg 100 mL/hr over 90 Minutes Intravenous  Once 09/30/17 1653 10/01/17 0819      Microbiology: Results for orders placed or performed during the hospital encounter of 09/30/17  CULTURE, BLOOD (ROUTINE X 2) w Reflex to ID Panel     Status: None   Collection Time: 09/30/17  5:43 PM  Result Value Ref Range Status   Specimen Description BLOOD CENTRAL LINE  Final   Special Requests   Final    BOTTLES DRAWN AEROBIC AND ANAEROBIC Blood Culture adequate volume   Culture   Final    NO GROWTH 5 DAYS Performed at [facility], [address] [place], [place] [address]    Report Status 10/05/2017 FINAL  Final  MRSA PCR Screening     Status: None   Collection Time: 09/30/17  8:02 PM  Result Value Ref Range Status   MRSA by PCR NEGATIVE NEGATIVE Final    Comment:        The GeneXpert MRSA Assay (FDA approved for NASAL specimens only), is one component of a comprehensive MRSA colonization surveillance program. It is not intended to diagnose MRSA infection nor to guide or monitor treatment for MRSA infections. Performed at [facility], [address] [place], [place] [address]   Urine Culture     Status: None   Collection Time: 09/30/17  8:04 PM  Result Value Ref Range Status   Specimen Description   Final    URINE, RANDOM Performed at [facility], [address] [place], [place] [address]    Special Requests   Final    NONE Performed at [facility], [address] [place],  [place] [address]    Culture   Final    NO GROWTH Performed at [facility], [place] [address], [place], [place] [address]    Report Status 10/02/2017 FINAL  Final  CULTURE, BLOOD (ROUTINE X 2) w Reflex to ID Panel     Status: None   Collection Time: 09/30/17  8:16 PM  Result Value Ref Range Status   Specimen Description BLOOD L HAND  Final   Special Requests   Final    BOTTLES  DRAWN AEROBIC AND ANAEROBIC Blood Culture results may not be optimal due to an inadequate volume of blood received in culture bottles   Culture   Final    NO GROWTH 5 DAYS Performed at [facility], [address] [place], [place] [address]    Report Status 10/05/2017 FINAL  Final  Culture, respiratory (NON-Expectorated)     Status: None   Collection Time: 09/30/17  9:07 PM  Result Value Ref Range Status   Specimen Description   Final    TRACHEAL ASPIRATE Performed at [facility], [address], [place], [place] [address]    Special Requests   Final    NONE Performed at [facility], [place], [place] [address]    Gram Stain   Final    MODERATE WBC PRESENT, PREDOMINANTLY PMN FEW GRAM POSITIVE COCCI IN PAIRS IN CLUSTERS FEW GRAM NEGATIVE RODS    Culture   Final    FEW Consistent with normal respiratory flora. Performed at [facility], [place] [address], [place], [place] [address]    Report Status 10/03/2017 FINAL  Final  CULTURE, BLOOD (ROUTINE X 2) w Reflex to ID Panel     Status: None (Preliminary result)   Collection Time: 10/05/17 11:48 AM  Result Value Ref Range Status   Specimen Description BLOOD LAC  Final   Special Requests   Final    BOTTLES DRAWN AEROBIC AND ANAEROBIC Blood Culture adequate volume   Culture   Final    NO GROWTH 2 DAYS Performed at [facility], [address] [place], [place] [address]    Report Status PENDING  Incomplete  CULTURE, BLOOD (ROUTINE X 2) w Reflex to ID Panel     Status: None (Preliminary result)   Collection Time: 10/05/17 11:55 AM  Result Value Ref Range Status   Specimen Description BLOOD LT HAND  Final   Special Requests   Final    BOTTLES DRAWN AEROBIC AND ANAEROBIC Blood Culture adequate volume   Culture   Final    NO GROWTH 2 DAYS Performed at [facility], [address] [place], [place] [address]    Report Status PENDING  Incomplete  Culture,  expectorated sputum-assessment     Status: None   Collection Time: 10/05/17  2:18 PM  Result Value Ref Range Status   Specimen Description TRACHEAL ASPIRATE  Final   Special Requests NONE  Final   Sputum evaluation   Final    THIS SPECIMEN IS ACCEPTABLE FOR SPUTUM CULTURE Performed at [facility], [address], [place], [place] [address]    Report Status 10/05/2017 FINAL  Final  Culture, respiratory (NON-Expectorated)     Status: None (Preliminary result)   Collection Time: 10/05/17  2:18 PM  Result Value Ref Range Status   Specimen Description   Final    TRACHEAL ASPIRATE Performed at [facility], [address], [place], [place] [address]    Special Requests   Final    NONE Reflexed from [id number] Performed at [facility], [address] [place], [place] [address]    Gram Stain   Final    MODERATE WBC PRESENT,BOTH PMN AND MONONUCLEAR NO SQUAMOUS EPITHELIAL CELLS SEEN FEW YEAST WITH PSEUDOHYPHAE RARE GRAM POSITIVE COCCI IN CLUSTERS    Culture   Final    CULTURE REINCUBATED FOR BETTER GROWTH Performed at [facility], [place] [address] [place], [place] [address]    Report Status PENDING  Incomplete    Studies: Dg Abd 1 View  Result Date: 10/06/2017 CLINICAL DATA:  Initial evaluation for ileus. EXAM: ABDOMEN - 1 VIEW COMPARISON:  Prior radiograph from 10/04/2017. FINDINGS: Enteric tube in place with tip overlying the gastric body, side hole at or just beyond the GE junction. Few mildly prominent gas-filled loops of bowel overlie the mid and left abdomen without overt evidence for obstruction. No abnormal bowel wall thickening. No soft tissue mass or abnormal calcification. Tubal ligation clips overlie the pelvis. Visualized lung bases are clear. IMPRESSION: 1. Few mildly prominent gas-filled loops of bowel overlying the mid and left abdomen without evidence for obstruction. 2. Enteric tube in place with tip  overlying the gastric body, side hole  at or just beyond the level of the GE junction. Electronically Signed   By: Jeannine Boga M.D.   On: 10/06/2017 13:13   Dg Abd 1 View  Result Date: 10/04/2017 CLINICAL DATA:  History of pneumonia, history of acute renal injury, smoking history EXAM: ABDOMEN - 1 VIEW COMPARISON:  Portable chest x-ray of 10/03/2017 FINDINGS: A supine view of the abdomen shows an NG tube extending into the proximal body of the stomach. The bowel gas pattern is nonspecific. No bowel obstruction is seen. A vascular sheath overlies the right pelvis. No bony abnormality is noted. IMPRESSION: 1. NG tube extends to a point overlying the proximal body of the stomach. 2. No bowel obstruction. Electronically Signed   By: Ivar Drape M.D.   On: 10/04/2017 09:54   Dg Chest Port 1 View  Result Date: 10/07/2017 CLINICAL DATA:  Atelectasis. EXAM: PORTABLE CHEST 1 VIEW COMPARISON:  Oct 06, 2017 FINDINGS: The ETT is in good position. The NG tube terminates below today's film. A right PICC line terminates in the central SVC, stable. No pneumothorax. The cardiomediastinal silhouette is normal. Haziness in the medial right lung base may represent vascular crowding/overlying soft tissue given positioning. Infiltrate considered less likely. No other interval changes. IMPRESSION: 1. Support apparatus as above. 2. There is haziness over the medial right lung base. I suspect this is either vascular crowding or overlying soft tissue attenuation given positioning. Subtle infiltrate considered less likely. Recommend attention on follow-up. Electronically Signed   By: Dorise Bullion III M.D   On: 10/07/2017 09:01   Dg Chest Port 1 View  Result Date: 10/06/2017 CLINICAL DATA:  Pneumonia EXAM: PORTABLE CHEST 1 VIEW COMPARISON:  10/04/2017 chest radiograph. FINDINGS: Endotracheal tube tip is 6.3 cm above the carina. Enteric tube enters stomach with the tip not seen on this image. Right PICC terminates in the lower third of the SVC. Stable  cardiomediastinal silhouette with normal heart size. No pneumothorax. No pleural effusion. Hyperinflated lungs. No pulmonary edema. Mild left basilar scarring versus atelectasis. No acute consolidative airspace disease. IMPRESSION: 1. Well-positioned support structures as detailed. 2. Hyperinflated lungs, suggesting COPD. 3. Stable mild left basilar scarring versus atelectasis. Electronically Signed   By: Ilona Sorrel M.D.   On: 10/06/2017 07:15   Dg Chest Port 1 View  Result Date: 10/04/2017 CLINICAL DATA:  Pneumonia EXAM: PORTABLE CHEST 1 VIEW COMPARISON:  10/03/2017 FINDINGS: Endotracheal tube and NG tube are unchanged. Hyperinflation of the lungs. Heart is normal size. Minimal left base atelectasis. No effusions. Right lung clear. No acute bony abnormality. IMPRESSION: Hyperinflation/COPD.  Left base atelectasis. Electronically Signed   By: Rolm Baptise M.D.   On: 10/04/2017 09:51   Dg Chest Port 1 View  Result Date: 10/03/2017 CLINICAL DATA:  Atelectasis EXAM: PORTABLE CHEST 1 VIEW COMPARISON:  10/02/2017 FINDINGS: Endotracheal tube in good position.  NG tube in the stomach. COPD with hyperinflation. Negative for infiltrate effusion or edema. IMPRESSION: Endotracheal tube in good position. COPD.  The lungs remain clear. Electronically Signed   By: Franchot Gallo M.D.   On: 10/03/2017 09:16   Dg Chest Port 1 View  Result Date: 10/02/2017 CLINICAL DATA:  Hypoxia EXAM: PORTABLE CHEST 1 VIEW COMPARISON:  September 30, 2017 FINDINGS: Endotracheal tube tip is 4.5 cm above the carina. Nasogastric tube tip and side port are below the diaphragm. No pneumothorax. Lungs are hyperexpanded. There is no edema or consolidation. The heart size is normal. Pulmonary vascularity is  within normal limits. There is aortic atherosclerosis. No adenopathy. No bone lesions. IMPRESSION: Tube positions as described without pneumothorax. Lungs hyperexpanded without edema or consolidation. Stable cardiac silhouette. There is aortic  atherosclerosis. Aortic Atherosclerosis (ICD10-I70.0). Electronically Signed   By: Lowella Grip III M.D.   On: 10/02/2017 07:31   Dg Chest Portable 1 View  Result Date: 09/30/2017 CLINICAL DATA:  Orogastric tube placement EXAM: PORTABLE CHEST 1 VIEW COMPARISON:  Earlier today FINDINGS: An orogastric tube tip reaches the stomach. The side port is near the GE junction. Endotracheal tube tip between the clavicular heads and carina. Large lung volumes with lucent appearance. There is no edema, consolidation, effusion, or pneumothorax. Normal heart size and mediastinal contours. Artifact from EKG leads. IMPRESSION: The new orogastric tube is in good position. Otherwise stable from earlier today. Electronically Signed   By: Monte Fantasia M.D.   On: 09/30/2017 18:48   Dg Chest Port 1 View  Result Date: 09/30/2017 CLINICAL DATA:  Status post intubation. EXAM: PORTABLE CHEST 1 VIEW COMPARISON:  September 30, 2017 FINDINGS: The ETT is in good position. No pneumothorax. Tiny effusions with atelectasis. No other changes. Emphysema. IMPRESSION: 1. The ETT is in good position.  No other changes. Electronically Signed   By: Dorise Bullion III M.D   On: 09/30/2017 17:28   Dg Chest Port 1 View  Result Date: 09/30/2017 CLINICAL DATA:  Shortness of breath. EXAM: PORTABLE CHEST 1 VIEW COMPARISON:  CT scan August 23, 2017.  Chest x-ray August 20, 2017. FINDINGS: Hyperexpansion of the lungs consistent with emphysema. Tiny pleural effusions and underlying atelectasis. No other interval changes. IMPRESSION: Emphysema.  Tiny pleural effusions and underlying atelectasis. Electronically Signed   By: Dorise Bullion III M.D   On: 09/30/2017 17:27   [place] Ekg Site Rite  Result Date: 10/04/2017 If Site Rite image not attached, placement could not be confirmed due to current cardiac rhythm.   Consults: Treatment Team:  Pccm, Ander Gaster, MD   Subjective:    Overnight Issues: patient has had difficulty with agitation on  mechanical ventilation to include both hypo-and hypertension along with severe tachycardia. When I arrived this morning she was in clear distress, breathing 40 times a minute, oxygen saturations of 85% and blood pressure 160/90. She was already on sedation to include fentanyl, ketamine, Precedex, given one-time dose of vecuronium and sedative medicines being adjusted  Objective:  Vital signs for last 24 hours: Temp:  [96.1 F (35.6 C)-101.1 F (38.4 C)] 100.2 F (37.9 C) (05/06 0745) Pulse Rate:  [61-139] 74 (05/06 1100) Resp:  [9-21] 16 (05/06 1100) BP: (85-207)/(61-91) 93/67 (05/06 1100) SpO2:  [89 %-99 %] 97 % (05/06 1100) FiO2 (%):  [28 %-40 %] 40 % (05/06 0800) Weight:  [114 lb 13.8 oz (52.1 kg)] 114 lb 13.8 oz (52.1 kg) (05/06 0300)  Hemodynamic parameters for last 24 hours:    Intake/Output from previous day: 05/05 0701 - 05/06 0700 In: 3187.8 [I.V.:1667.8; NG/GT:1120; IV Piggyback:400] Out: 1075 [Urine:1075]  Intake/Output this shift: Total I/O In: 425.5 [I.V.:190.5; NG/GT:135; IV Piggyback:100] Out: 550 [Urine:550]  Vent settings for last 24 hours: Vent Mode: PRVC FiO2 (%):  [28 %-40 %] 40 % Set Rate:  [16 bmp] 16 bmp Vt Set:  [450 mL] 450 mL PEEP:  [5 cmH20] 5 cmH20 Plateau Pressure:  [20 cmH20-26 cmH20] 21 cmH20  Physical Exam:  Presently patient is resting comfortably on mechanical ventilation. She is sedated and unresponsive Vital signs: Please see the above listed vital signs HEENT:  Trachea is midline, oral endotracheal tube in place, no accessory muscle utilization, no jugular venous distention is noted Cardiovascular: Regular rate and rhythm, sinus mechanism noted on monitor Pulmonary: Prolonged expiratory phase, diffuse expiratory rhonchi and wheezes noted Abdominal: Hypoactive bowel sounds, generally soft exam Tremors: No clubbing cyanosis or edema noted Neurologic: Patient is sedated limited exam  Assessment/Plan:   Severe respiratory failure with  acute bronchospasm. Patient is presently on Solu-Medrol, Zosyn, vancomycin, albuterol, Atrovent, sedation has been adjusted to include propofol and fentanyl, ketamine has been stopped. Patient not amenable today will hold on spontaneous awakening and breathing trial.  Anemia: No evidence of active bleeding  Elevated CO2 on BMP most likely consistent with chronic respiratory acidosis  Hyperglycemia. On sliding scale   Critical Care Total Time 40 minutes  Brinlyn Cena 10/07/2017  *Care during the described time interval was provided by me and/or other providers on the critical care team.  I have reviewed this patient's available data, including medical history, events of note, physical examination and test results as part of my evaluation.

## 2017-10-08 LAB — CBC
HEMATOCRIT: 28.4 % — AB (ref 35.0–47.0)
HEMOGLOBIN: 9.7 g/dL — AB (ref 12.0–16.0)
MCH: 30.1 pg (ref 26.0–34.0)
MCHC: 34 g/dL (ref 32.0–36.0)
MCV: 88.5 fL (ref 80.0–100.0)
Platelets: 266 10*3/uL (ref 150–440)
RBC: 3.21 MIL/uL — AB (ref 3.80–5.20)
RDW: 14.6 % — ABNORMAL HIGH (ref 11.5–14.5)
WBC: 18.5 10*3/uL — ABNORMAL HIGH (ref 3.6–11.0)

## 2017-10-08 LAB — CULTURE, RESPIRATORY W GRAM STAIN

## 2017-10-08 LAB — GLUCOSE, CAPILLARY
GLUCOSE-CAPILLARY: 129 mg/dL — AB (ref 65–99)
GLUCOSE-CAPILLARY: 153 mg/dL — AB (ref 65–99)
GLUCOSE-CAPILLARY: 209 mg/dL — AB (ref 65–99)
Glucose-Capillary: 102 mg/dL — ABNORMAL HIGH (ref 65–99)
Glucose-Capillary: 140 mg/dL — ABNORMAL HIGH (ref 65–99)
Glucose-Capillary: 165 mg/dL — ABNORMAL HIGH (ref 65–99)
Glucose-Capillary: 197 mg/dL — ABNORMAL HIGH (ref 65–99)

## 2017-10-08 LAB — CULTURE, RESPIRATORY

## 2017-10-08 LAB — BASIC METABOLIC PANEL
Anion gap: 6 (ref 5–15)
BUN: 26 mg/dL — AB (ref 6–20)
CHLORIDE: 101 mmol/L (ref 101–111)
CO2: 36 mmol/L — ABNORMAL HIGH (ref 22–32)
Calcium: 8.9 mg/dL (ref 8.9–10.3)
Creatinine, Ser: 0.57 mg/dL (ref 0.44–1.00)
GFR calc Af Amer: >60 mL/min (ref >60)
GFR calc non Af Amer: >60 mL/min (ref >60)
Glucose, Bld: 171 mg/dL — ABNORMAL HIGH (ref 65–99)
POTASSIUM: 4.4 mmol/L (ref 3.5–5.1)
SODIUM: 143 mmol/L (ref 135–145)

## 2017-10-08 LAB — THEOPHYLLINE LEVEL
Theophylline Lvl: <0.8 ug/mL — ABNORMAL LOW (ref 10.0–20.0)
Theophylline Lvl: 8.5 ug/mL — ABNORMAL LOW (ref 10.0–20.0)

## 2017-10-08 MED ORDER — POLYETHYLENE GLYCOL 3350 17 G PO PACK
17.0000 g | PACK | Freq: Once | ORAL | Status: AC
  Administered 2017-10-08: 17 g
  Filled 2017-10-08: qty 1

## 2017-10-08 MED ORDER — VITAL HIGH PROTEIN PO LIQD
1000.0000 mL | ORAL | Status: DC
  Administered 2017-10-08 – 2017-10-14 (×9): 1000 mL

## 2017-10-08 MED ORDER — ASPIRIN 81 MG PO CHEW
81.0000 mg | CHEWABLE_TABLET | Freq: Every day | ORAL | Status: DC
  Administered 2017-10-09 – 2017-11-13 (×34): 81 mg via ORAL
  Filled 2017-10-08 (×35): qty 1

## 2017-10-08 MED ORDER — FREE WATER
100.0000 mL | Freq: Four times a day (QID) | Status: DC
  Administered 2017-10-08 – 2017-10-15 (×26): 100 mL

## 2017-10-08 MED ORDER — PRO-STAT SUGAR FREE PO LIQD
30.0000 mL | Freq: Two times a day (BID) | ORAL | Status: DC
  Administered 2017-10-08 – 2017-10-13 (×12): 30 mL

## 2017-10-08 MED ORDER — SENNOSIDES-DOCUSATE SODIUM 8.6-50 MG PO TABS
2.0000 | ORAL_TABLET | Freq: Two times a day (BID) | ORAL | Status: DC
  Administered 2017-10-08 – 2017-10-20 (×25): 2
  Filled 2017-10-08 (×26): qty 2

## 2017-10-08 NOTE — Progress Notes (Signed)
Follow up - Critical Care Medicine Note  Patient Details:    Michelle [name] is an 57 y.o. female.with a known history of anxiety, CHF, cocaine abuse, COPD, coronary artery disease, hepatitis C, hypertension, intubated on mechanical ventilation.   Lines, Airways, Drains: Airway 7.5 mm (Active)  Secured at (cm) 21 cm 10/07/2017  8:00 AM  Measured From Lips 10/07/2017  8:00 AM  Secured Location Left 10/07/2017  8:00 AM  Secured By [employer] 10/07/2017  8:00 AM  Tube Holder Repositioned Yes 10/07/2017  8:00 AM  Cuff Pressure (cm H2O) 25 cm H2O 10/07/2017  8:00 AM  Site Condition Dry 10/07/2017  8:00 AM     PICC Triple Lumen 10/04/17 PICC Right Brachial 36 cm 0 cm (Active)  Indication for Insertion or Continuance of Line Prolonged intravenous therapies 10/07/2017  7:44 AM  Exposed Catheter (cm) 0 cm 10/04/2017  5:00 PM  Site Assessment Clean;Dry;Intact 10/07/2017  7:30 AM  Lumen #1 Status Infusing;Flushed;Blood return noted 10/07/2017  7:30 AM  Lumen #2 Status Infusing;Flushed;Blood return noted 10/07/2017  7:30 AM  Lumen #3 Status Infusing;Flushed;Blood return noted 10/07/2017  7:30 AM  Dressing Type Transparent 10/07/2017  7:30 AM  Dressing Status Intact;Dry;Clean;Antimicrobial disc in place 10/07/2017  7:30 AM  Line Care Connections checked and tightened 10/07/2017  7:30 AM  Line Adjustment (NICU/IV Team Only) No 10/06/2017  2:15 PM  Dressing Intervention Dressing changed;Antimicrobial disc changed 10/07/2017  4:02 AM  Dressing Change Due 10/11/17 10/07/2017  7:30 AM     NG/OG Tube Orogastric 14 Fr. Left mouth Aucultation Documented cm marking at nare/ corner of mouth 55 cm (Active)  Cm Marking at Nare/Corner of Mouth (if applicable) 55 cm 07/06/252  7:30 AM  External Length of Tube (cm) - (if applicable) 55 cm 07/11/621  8:03 PM  Site Assessment Clean;Dry;Intact 10/07/2017  7:30 AM  Ongoing Placement Verification No change in respiratory status;No acute changes, not attributed to clinical condition;Xray;No  change in cm markings or external length of tube from initial placement 10/07/2017  7:30 AM  Status Infusing tube feed 10/07/2017  7:30 AM  Intake (mL) 40 mL 10/06/2017  2:00 PM     Urethral Catheter ANNA AND STUDENT CNA Latex 16 Fr. (Active)  Indication for Insertion or Continuance of Catheter Other (comment) 10/07/2017  7:30 AM  Site Assessment Clean;Intact;Dry 10/07/2017  7:30 AM  Catheter Maintenance Bag below level of bladder;Catheter secured;Drainage bag/tubing not touching floor;Insertion date on drainage bag;No dependent loops;Seal intact 10/07/2017  7:44 AM  Collection Container Standard drainage bag 10/07/2017  7:30 AM  Securement Method Securing device (Describe) 10/07/2017  7:30 AM  Urinary Catheter Interventions Unclamped 10/07/2017  7:30 AM  Output (mL) 550 mL 10/07/2017  8:00 AM    Anti-infectives:  Anti-infectives (From admission, onward)   Start     Dose/Rate Route Frequency Ordered Stop   10/07/17 0900  vancomycin (VANCOCIN) 500 mg in sodium chloride 0.9 % 100 mL IVPB  Status:  Discontinued     500 mg 100 mL/hr over 60 Minutes Intravenous Every 8 hours 10/07/17 0343 10/07/17 1033   10/06/17 0100  vancomycin (VANCOCIN) 500 mg in sodium chloride 0.9 % 100 mL IVPB  Status:  Discontinued     500 mg 100 mL/hr over 60 Minutes Intravenous Every 12 hours 10/05/17 1540 10/07/17 0343   10/05/17 1145  vancomycin (VANCOCIN) IVPB 1000 mg/200 mL premix     1,000 mg 200 mL/hr over 60 Minutes Intravenous  Once 10/05/17 1132 10/05/17 1401  10/01/17 2200  levofloxacin (LEVAQUIN) IVPB 750 mg  Status:  Discontinued     750 mg 100 mL/hr over 90 Minutes Intravenous Every 48 hours 10/01/17 2150 10/02/17 1054   10/01/17 1800  levofloxacin (LEVAQUIN) IVPB 500 mg  Status:  Discontinued     500 mg 100 mL/hr over 60 Minutes Intravenous Daily-1800 09/30/17 1853 10/01/17 0804   10/01/17 0900  piperacillin-tazobactam (ZOSYN) IVPB 3.375 g     3.375 g 12.5 mL/hr over 240 Minutes Intravenous Every 8 hours 10/01/17  0804 10/08/17 0545   09/30/17 1700  levofloxacin (LEVAQUIN) IVPB 750 mg     750 mg 100 mL/hr over 90 Minutes Intravenous  Once 09/30/17 1653 10/01/17 0819      Microbiology: Results for orders placed or performed during the hospital encounter of 09/30/17  CULTURE, BLOOD (ROUTINE X 2) w Reflex to ID Panel     Status: None   Collection Time: 09/30/17  5:43 PM  Result Value Ref Range Status   Specimen Description BLOOD CENTRAL LINE  Final   Special Requests   Final    BOTTLES DRAWN AEROBIC AND ANAEROBIC Blood Culture adequate volume   Culture   Final    NO GROWTH 5 DAYS Performed at [facility], [address] [place], [place] [address]    Report Status 10/05/2017 FINAL  Final  MRSA PCR Screening     Status: None   Collection Time: 09/30/17  8:02 PM  Result Value Ref Range Status   MRSA by PCR NEGATIVE NEGATIVE Final    Comment:        The GeneXpert MRSA Assay (FDA approved for NASAL specimens only), is one component of a comprehensive MRSA colonization surveillance program. It is not intended to diagnose MRSA infection nor to guide or monitor treatment for MRSA infections. Performed at [facility], [address], [place], [place] [address]   Urine Culture     Status: None   Collection Time: 09/30/17  8:04 PM  Result Value Ref Range Status   Specimen Description   Final    URINE, RANDOM Performed at [facility], [address] [place], [place] [address]    Special Requests   Final    NONE Performed at [facility], [address], [place], [place] [address]    Culture   Final    NO GROWTH Performed at [facility], [place] [address], [place], [place] [address]    Report Status 10/02/2017 FINAL  Final  CULTURE, BLOOD (ROUTINE X 2) w Reflex to ID Panel     Status: None   Collection Time: 09/30/17  8:16 PM  Result Value Ref Range Status   Specimen Description BLOOD L HAND  Final   Special Requests   Final    BOTTLES  DRAWN AEROBIC AND ANAEROBIC Blood Culture results may not be optimal due to an inadequate volume of blood received in culture bottles   Culture   Final    NO GROWTH 5 DAYS Performed at [facility], [address] [place], [place] [address]    Report Status 10/05/2017 FINAL  Final  Culture, respiratory (NON-Expectorated)     Status: None   Collection Time: 09/30/17  9:07 PM  Result Value Ref Range Status   Specimen Description   Final    TRACHEAL ASPIRATE Performed at [facility], [address] [place], [place] [address]    Special Requests   Final    NONE Performed at [facility], [place], [place] [address]    Gram Stain   Final    MODERATE WBC PRESENT, PREDOMINANTLY PMN FEW GRAM POSITIVE COCCI IN PAIRS IN CLUSTERS FEW GRAM NEGATIVE RODS    Culture   Final    FEW Consistent with normal respiratory flora. Performed at [facility], [place] [address], [place], [place] [address]    Report Status 10/03/2017 FINAL  Final  CULTURE, BLOOD (ROUTINE X 2) w Reflex to ID Panel     Status: None (Preliminary result)   Collection Time: 10/05/17 11:48 AM  Result Value Ref Range Status   Specimen Description BLOOD LAC  Final   Special Requests   Final    BOTTLES DRAWN AEROBIC AND ANAEROBIC Blood Culture adequate volume   Culture   Final    NO GROWTH 3 DAYS Performed at [facility], [address], [place], [place] [address]    Report Status PENDING  Incomplete  CULTURE, BLOOD (ROUTINE X 2) w Reflex to ID Panel     Status: None (Preliminary result)   Collection Time: 10/05/17 11:55 AM  Result Value Ref Range Status   Specimen Description BLOOD LT HAND  Final   Special Requests   Final    BOTTLES DRAWN AEROBIC AND ANAEROBIC Blood Culture adequate volume   Culture   Final    NO GROWTH 3 DAYS Performed at [facility], [address], [place], [place] [address]    Report Status PENDING  Incomplete  Culture,  expectorated sputum-assessment     Status: None   Collection Time: 10/05/17  2:18 PM  Result Value Ref Range Status   Specimen Description TRACHEAL ASPIRATE  Final   Special Requests NONE  Final   Sputum evaluation   Final    THIS SPECIMEN IS ACCEPTABLE FOR SPUTUM CULTURE Performed at [facility], [address], [place], [place] [address]    Report Status 10/05/2017 FINAL  Final  Culture, respiratory (NON-Expectorated)     Status: None (Preliminary result)   Collection Time: 10/05/17  2:18 PM  Result Value Ref Range Status   Specimen Description   Final    TRACHEAL ASPIRATE Performed at [facility], [address] [place], [place] [address]    Special Requests   Final    NONE Reflexed from [id number] Performed at [facility], [address] [place], [place] [address]    Gram Stain   Final    MODERATE WBC PRESENT,BOTH PMN AND MONONUCLEAR NO SQUAMOUS EPITHELIAL CELLS SEEN FEW YEAST WITH PSEUDOHYPHAE RARE GRAM POSITIVE COCCI IN CLUSTERS    Culture   Final    CULTURE REINCUBATED FOR BETTER GROWTH Performed at [facility], [place] [address], [place], [place] [address]    Report Status PENDING  Incomplete    Studies: Dg Abd 1 View  Result Date: 10/06/2017 CLINICAL DATA:  Initial evaluation for ileus. EXAM: ABDOMEN - 1 VIEW COMPARISON:  Prior radiograph from 10/04/2017. FINDINGS: Enteric tube in place with tip overlying the gastric body, side hole at or just beyond the GE junction. Few mildly prominent gas-filled loops of bowel overlie the mid and left abdomen without overt evidence for obstruction. No abnormal bowel wall thickening. No soft tissue mass or abnormal calcification. Tubal ligation clips overlie the pelvis. Visualized lung bases are clear. IMPRESSION: 1. Few mildly prominent gas-filled loops of bowel overlying the mid and left abdomen without evidence for obstruction. 2. Enteric tube in place with tip  overlying the gastric body, side hole  at or just beyond the level of the GE junction. Electronically Signed   By: Jeannine Boga M.D.   On: 10/06/2017 13:13   Dg Abd 1 View  Result Date: 10/04/2017 CLINICAL DATA:  History of pneumonia, history of acute renal injury, smoking history EXAM: ABDOMEN - 1 VIEW COMPARISON:  Portable chest x-ray of 10/03/2017 FINDINGS: A supine view of the abdomen shows an NG tube extending into the proximal body of the stomach. The bowel gas pattern is nonspecific. No bowel obstruction is seen. A vascular sheath overlies the right pelvis. No bony abnormality is noted. IMPRESSION: 1. NG tube extends to a point overlying the proximal body of the stomach. 2. No bowel obstruction. Electronically Signed   By: Ivar Drape M.D.   On: 10/04/2017 09:54   Dg Chest Port 1 View  Result Date: 10/07/2017 CLINICAL DATA:  Atelectasis. EXAM: PORTABLE CHEST 1 VIEW COMPARISON:  Oct 06, 2017 FINDINGS: The ETT is in good position. The NG tube terminates below today's film. A right PICC line terminates in the central SVC, stable. No pneumothorax. The cardiomediastinal silhouette is normal. Haziness in the medial right lung base may represent vascular crowding/overlying soft tissue given positioning. Infiltrate considered less likely. No other interval changes. IMPRESSION: 1. Support apparatus as above. 2. There is haziness over the medial right lung base. I suspect this is either vascular crowding or overlying soft tissue attenuation given positioning. Subtle infiltrate considered less likely. Recommend attention on follow-up. Electronically Signed   By: Dorise Bullion III M.D   On: 10/07/2017 09:01   Dg Chest Port 1 View  Result Date: 10/06/2017 CLINICAL DATA:  Pneumonia EXAM: PORTABLE CHEST 1 VIEW COMPARISON:  10/04/2017 chest radiograph. FINDINGS: Endotracheal tube tip is 6.3 cm above the carina. Enteric tube enters stomach with the tip not seen on this image. Right PICC terminates in the lower third of the SVC. Stable  cardiomediastinal silhouette with normal heart size. No pneumothorax. No pleural effusion. Hyperinflated lungs. No pulmonary edema. Mild left basilar scarring versus atelectasis. No acute consolidative airspace disease. IMPRESSION: 1. Well-positioned support structures as detailed. 2. Hyperinflated lungs, suggesting COPD. 3. Stable mild left basilar scarring versus atelectasis. Electronically Signed   By: Ilona Sorrel M.D.   On: 10/06/2017 07:15   Dg Chest Port 1 View  Result Date: 10/04/2017 CLINICAL DATA:  Pneumonia EXAM: PORTABLE CHEST 1 VIEW COMPARISON:  10/03/2017 FINDINGS: Endotracheal tube and NG tube are unchanged. Hyperinflation of the lungs. Heart is normal size. Minimal left base atelectasis. No effusions. Right lung clear. No acute bony abnormality. IMPRESSION: Hyperinflation/COPD.  Left base atelectasis. Electronically Signed   By: Rolm Baptise M.D.   On: 10/04/2017 09:51   Dg Chest Port 1 View  Result Date: 10/03/2017 CLINICAL DATA:  Atelectasis EXAM: PORTABLE CHEST 1 VIEW COMPARISON:  10/02/2017 FINDINGS: Endotracheal tube in good position.  NG tube in the stomach. COPD with hyperinflation. Negative for infiltrate effusion or edema. IMPRESSION: Endotracheal tube in good position. COPD.  The lungs remain clear. Electronically Signed   By: Franchot Gallo M.D.   On: 10/03/2017 09:16   Dg Chest Port 1 View  Result Date: 10/02/2017 CLINICAL DATA:  Hypoxia EXAM: PORTABLE CHEST 1 VIEW COMPARISON:  September 30, 2017 FINDINGS: Endotracheal tube tip is 4.5 cm above the carina. Nasogastric tube tip and side port are below the diaphragm. No pneumothorax. Lungs are hyperexpanded. There is no edema or consolidation. The heart size is normal. Pulmonary vascularity is  within normal limits. There is aortic atherosclerosis. No adenopathy. No bone lesions. IMPRESSION: Tube positions as described without pneumothorax. Lungs hyperexpanded without edema or consolidation. Stable cardiac silhouette. There is aortic  atherosclerosis. Aortic Atherosclerosis (ICD10-I70.0). Electronically Signed   By: Lowella Grip III M.D.   On: 10/02/2017 07:31   Dg Chest Portable 1 View  Result Date: 09/30/2017 CLINICAL DATA:  Orogastric tube placement EXAM: PORTABLE CHEST 1 VIEW COMPARISON:  Earlier today FINDINGS: An orogastric tube tip reaches the stomach. The side port is near the GE junction. Endotracheal tube tip between the clavicular heads and carina. Large lung volumes with lucent appearance. There is no edema, consolidation, effusion, or pneumothorax. Normal heart size and mediastinal contours. Artifact from EKG leads. IMPRESSION: The new orogastric tube is in good position. Otherwise stable from earlier today. Electronically Signed   By: Monte Fantasia M.D.   On: 09/30/2017 18:48   Dg Chest Port 1 View  Result Date: 09/30/2017 CLINICAL DATA:  Status post intubation. EXAM: PORTABLE CHEST 1 VIEW COMPARISON:  September 30, 2017 FINDINGS: The ETT is in good position. No pneumothorax. Tiny effusions with atelectasis. No other changes. Emphysema. IMPRESSION: 1. The ETT is in good position.  No other changes. Electronically Signed   By: Dorise Bullion III M.D   On: 09/30/2017 17:28   Dg Chest Port 1 View  Result Date: 09/30/2017 CLINICAL DATA:  Shortness of breath. EXAM: PORTABLE CHEST 1 VIEW COMPARISON:  CT scan August 23, 2017.  Chest x-ray August 20, 2017. FINDINGS: Hyperexpansion of the lungs consistent with emphysema. Tiny pleural effusions and underlying atelectasis. No other interval changes. IMPRESSION: Emphysema.  Tiny pleural effusions and underlying atelectasis. Electronically Signed   By: Dorise Bullion III M.D   On: 09/30/2017 17:27   [place] Ekg Site Rite  Result Date: 10/04/2017 If Site Rite image not attached, placement could not be confirmed due to current cardiac rhythm.   Consults: Treatment Team:  Pccm, Ander Gaster, MD   Subjective:    Overnight Issues: patient has had difficulty with agitation on  mechanical ventilation to include both hypo-and hypertension along with severe tachycardia. Presently resting on propofol and fentanyl. Patient has not wean amenable at this time  Objective:  Vital signs for last 24 hours: Temp:  [97.7 F (36.5 C)-100.8 F (38.2 C)] 97.9 F (36.6 C) (05/07 0700) Pulse Rate:  [73-136] 73 (05/07 0700) Resp:  [9-17] 16 (05/07 0700) BP: (81-159)/(47-99) 106/58 (05/07 0700) SpO2:  [89 %-99 %] 99 % (05/07 0810) FiO2 (%):  [30 %-40 %] 40 % (05/07 0810) Weight:  [122 lb 9.2 oz (55.6 kg)] 122 lb 9.2 oz (55.6 kg) (05/07 0306)  Hemodynamic parameters for last 24 hours:    Intake/Output from previous day: 05/06 0701 - 05/07 0700 In: 3378.8 [I.V.:2098.8; NG/GT:1080; IV Piggyback:200] Out: 5573 [Urine:3850]  Intake/Output this shift: No intake/output data recorded.  Vent settings for last 24 hours: Vent Mode: PRVC FiO2 (%):  [30 %-40 %] 40 % Set Rate:  [16 bmp] 16 bmp Vt Set:  [450 mL] 450 mL PEEP:  [5 cmH20] 5 cmH20 Plateau Pressure:  [16 cmH20-22 cmH20] 16 cmH20  Physical Exam:  Presently patient is resting comfortably on mechanical ventilation. She is sedated and unresponsive Vital signs: Please see the above listed vital signs HEENT: Trachea is midline, oral endotracheal tube in place, no accessory muscle utilization, no jugular venous distention is noted Cardiovascular: Regular rate and rhythm, sinus mechanism noted on monitor Pulmonary: Prolonged expiratory phase, diffuse expiratory rhonchi and  wheezes noted Abdominal: Hypoactive bowel sounds, generally soft exam Tremors: No clubbing cyanosis or edema noted Neurologic: Patient is sedated limited exam  Assessment/Plan:   Severe respiratory failure with acute bronchospasm. Patient is presently on Solu-Medrol, albuterol, Atrovent, completed antibiotics,sedation has been adjusted to include propofol and fentanyl. Patient not amenable today will hold on spontaneous awakening and breathing trial. Will  need to discuss with family do not feel patient will be able to be extubated will discuss goals of care consideration for tracheostomy and PEG tube placement.  Anemia: No evidence of active bleeding  Elevated CO2 on BMP most likely consistent with chronic respiratory acidosis  Hyperglycemia. On sliding scale   Critical Care Total Time 40 minutes  Tywone Bembenek 10/08/2017  *Care during the described time interval was provided by me and/or other providers on the critical care team.  I have reviewed this patient's available data, including medical history, events of note, physical examination and test results as part of my evaluation. Patient ID: JAKYAH BRADBY, female   DOB: 1960-12-09, 57 y.o.   MRN: [id number]

## 2017-10-08 NOTE — Progress Notes (Signed)
[place] [facility] [place] NAME: Michelle [name]    MR#:  [id number]  DATE OF BIRTH:  1960/09/22  SUBJECTIVE:  CHIEF COMPLAINT:   Chief Complaint  Patient presents with  . Shortness of Breath   - remains intubated, failed weaning trial, on sedation and on tube feeds - need to discuss possibility of trach and peg with family.  REVIEW OF SYSTEMS:  Review of Systems  Unable to perform ROS: Critical illness    DRUG ALLERGIES:   Allergies  Allergen Reactions  . Carvedilol Other (See Comments)    Pt gets "sick" when on medication    VITALS:  Blood pressure (!) 97/59, pulse 84, temperature 97.9 F (36.6 C), resp. rate 16, height 4\' 9"  (1.448 m), weight 55.6 kg (122 lb 9.2 oz), last menstrual period 03/06/2005, SpO2 99 %.  PHYSICAL EXAMINATION:  Physical Exam  GENERAL:  57 y.o.-year-old patient lying in the bed with no acute distress.  EYES: Pupils equal, round, reactive to light and accommodation. No scleral icterus. Extraocular muscles intact.  HEENT: Head atraumatic, normocephalic. Oropharynx and nasopharynx clear.orally intubated and has OG tube  NECK:  Supple, no jugular venous distention. No thyroid enlargement, no tenderness.  LUNGS: Normal breath sounds bilaterally, no wheezing, rales,rhonchi or crepitation. No use of accessory muscles of respiration. Decreased bibasilar breath sounds CARDIOVASCULAR: S1, S2 normal. No murmurs, rubs, or gallops.  ABDOMEN: Soft, nontender, nondistended.  Hypoactive bowel sounds present. No organomegaly or mass.  EXTREMITIES: No cyanosis, or clubbing. 1-2+ edema of hands and feet edema NEUROLOGIC: intubated and sedated.  Restless movements in bed noted PSYCHIATRIC: The patient is sedated.  SKIN: No obvious rash, lesion, or ulcer.    LABORATORY PANEL:   CBC Recent Labs  Lab 10/08/17 0433  WBC 18.5*  HGB 9.7*  HCT 28.4*  PLT 266    ------------------------------------------------------------------------------------------------------------------  Chemistries  Recent Labs  Lab 10/07/17 0558 10/08/17 0433  NA 140 143  K 4.6 4.4  CL 102 101  CO2 33* 36*  GLUCOSE 178* 171*  BUN 28* 26*  CREATININE 0.60 0.57  CALCIUM 8.5* 8.9  MG 1.9  --    ------------------------------------------------------------------------------------------------------------------  Cardiac Enzymes No results for input(s): TROPONINI in the last 168 hours. ------------------------------------------------------------------------------------------------------------------  RADIOLOGY:  Dg Chest Port 1 View  Result Date: 10/07/2017 CLINICAL DATA:  Atelectasis. EXAM: PORTABLE CHEST 1 VIEW COMPARISON:  Oct 06, 2017 FINDINGS: The ETT is in good position. The NG tube terminates below today's film. A right PICC line terminates in the central SVC, stable. No pneumothorax. The cardiomediastinal silhouette is normal. Haziness in the medial right lung base may represent vascular crowding/overlying soft tissue given positioning. Infiltrate considered less likely. No other interval changes. IMPRESSION: 1. Support apparatus as above. 2. There is haziness over the medial right lung base. I suspect this is either vascular crowding or overlying soft tissue attenuation given positioning. Subtle infiltrate considered less likely. Recommend attention on follow-up. Electronically Signed   By: Dorise Bullion III M.D   On: 10/07/2017 09:01    EKG:   Orders placed or performed during the hospital encounter of 09/30/17  . EKG 12-Lead  . EKG 12-Lead  . EKG 12-Lead  . EKG 12-Lead    ASSESSMENT AND PLAN:   [name] a57 y.o.femalewith a known history of anxiety, CHF, cocaine abuse, COPD, coronary artery disease, hepatitis C, hypertension admitted for acute respiratory failure and intubated.  1.  Acute on chronic respiratory failure with hypoxia-secondary  to  acute on chronic COPD exacerbation -Remains on vent, continue weaning trial as tolerated -Appreciate pulmonary management  -On steroids, nebs and Zosyn for bronchitis/pneumonia -Vancomycin was added for fevers- but discontinued now - Needs to discuss about possibility of Trach with family if unable to be weaned off, as been on vent for 8 days  2.  Hypotension-secondary to sedation medications.  Not receiving levophed now.  3.  Anxiety-continue Xanax scheduled.  Also on Precedex, fentanyl, versed added and risperidone  4.  DVT prophylaxis-subcutaneous heparin  Discussed with ICU attending   All the records are reviewed and case discussed with Care Management/Social Workerr. Management plans discussed with the patient, family and they are in agreement.  CODE STATUS: Full Code  TOTAL TIME TAKING CARE OF THIS PATIENT: 26 minutes.   POSSIBLE D/C IN ? DAYS, DEPENDING ON CLINICAL CONDITION.   Gladstone Lighter M.D on 10/08/2017 at 11:23 AM  Between 7am to 6pm - Pager - [phone]  After 6pm go to [email or web address] - password [email or web address] [facility]  Office  [phone]  CC: Primary care physician; Center, [facility]

## 2017-10-08 NOTE — Progress Notes (Signed)
Report given to Myra RN at 16:03 pm

## 2017-10-08 NOTE — Progress Notes (Signed)
Nutrition Follow-up  DOCUMENTATION CODES:   Non-severe (moderate) malnutrition in context of chronic illness  INTERVENTION:  Initiate new goal TF regimen of Vital High Protein at 20 mL/hr (480 mL goal daily volume) + Pro-Stat 30 mL BID per OGT. Provides 680 kcal, 72 grams of protein, 403 mL H2O daily. With current propofol rate provides 1274 kcal daily.  Continue liquid MVI daily per tube.  Provide free water flush of 100 mL Q6hrs, which provides a total of 1003 mL H2O daily including water in tube feeds as patient is still slightly edematous.  NUTRITION DIAGNOSIS:   Moderate Malnutrition related to chronic illness(COPD, CHF, hepatitis C, cocaine abuse) as evidenced by mild fat depletion, moderate fat depletion, mild muscle depletion, moderate muscle depletion.  Ongoing - addressing with TF regimen.  GOAL:   Provide needs based on ASPEN/SCCM guidelines  Met with TF regimen.  MONITOR:   Vent status, Labs, Weight trends, TF tolerance, I & O's  REASON FOR ASSESSMENT:   Ventilator    ASSESSMENT:   58 year old female with PMHx of emphysema, COPD, HTN, hypercholesterolemia, hx pneumothorax s/p chest tube insertion, CAD s/p cardiac catheterization, cocaine abuse, tobacco abuse, asthma, CHF, anxiety, hepatitis C admitted with acute exacerbation of COPD, acute on chronic respiratory failure requiring intubation 4/29, PNA, cocaine intoxication, AKI due to renal hypoperfusion.   -Patient had an episode of emesis on evening of 5/2. Per chart it looks like tube feeds were continued and patient has had no further documentation of intolerance. -On 5/3 tube feeds were adjusted to Vital AF 1.2 at 45 mL/hr. -Abdominal x-ray was completed on 5/5 that only found a few mildly prominent gas-filled loops of bowel and no evidence of obstruction.  Patient intubated and sedated. On Propofol now. Abdomen a little distended but still tolerating tube feeds. No documented bowel movement this admission.  Bowel regimen was adjusted today. Urine output appears light in color. There is still some generalized mild pitting edema present, but patient has good UOP.  Access: 14 Fr. OGT placed 4/29; terminates in gastric body per abdominal x-ray 5/5; 55 cm at corner of mouth  MAP: 67-94 mmHg  TF: pt has been tolerating Vital AF 1.2 at 45 mL/hr with free water flush of 30 mL Q4hrs  Patient is currently intubated on ventilator support Ve: 7.4 L/min Temp (24hrs), Avg:98.4 F (36.9 C), Min:97.5 F (36.4 C), Max:99.9 F (37.7 C)  Propofol: 22.5 ml/hr (594 kcal daily)  Medications reviewed and include: Xanax, famotidine, Novolog 0-9 units Q4hrs, Solu-Medrol 40 mg Q8hrs IV, liquid MVI daily, Miralax once today, senna-docusate 2 tablets BID, fentanyl gtt, Levophed gtt at 2 mcg/min, propofol gtt.  Labs reviewed: CBG 116-209 past 24 hrs, CO2 36, BUN 26.  I/O: 3850 mL UOP yesterday (2.9 mL/kg/hr)  Weight trend: 55.6 kg on 5/7; +9.9 kg from admission  Discussed with RN and on rounds.  Diet Order:   Diet Order           Diet NPO time specified  Diet effective now          EDUCATION NEEDS:   No education needs have been identified at this time  Skin:  Skin Assessment: Reviewed RN Assessment  Last BM:  Unknown  Height:   Ht Readings from Last 1 Encounters:  10/01/17 4' 9"  (1.448 m)    Weight:   Wt Readings from Last 1 Encounters:  10/08/17 122 lb 9.2 oz (55.6 kg)    Ideal Body Weight:  43.2 kg  BMI:  Body mass index is 26.53 kg/m.  Estimated Nutritional Needs:   Kcal:  1324 (PSU 2003b w/ MSJ 1053, Ve 7.4, Tmax 37.7)  Protein:  70-80 grams (1.5-1.8 grams/kg)  Fluid:  1.4-1.6 L/day (30-35 mL/kg)  Willey Blade, MS, RD, LDN Office: [phone] Pager: [phone] After Hours/Weekend Pager: [phone]

## 2017-10-08 NOTE — Progress Notes (Signed)
Pharmacy Consult:  Pharmacy consulted to assist with constipation management for 57 yo female admitted with COPD exacerbation requiring mechanical ventilation and sedation with fentanyl drip. Patient has not had a documented bowel movement this admission.   Patient ordered docusate 100mg  VT BID.    Plan: Will transition patient to senna/docusate 2 tabs VT BID and initiate Miralax 17g VT x 1.   Pharmacy will continue to monitor and adjust per consult.   Simpson,Michael L 10/08/2017 7:33 PM

## 2017-10-09 ENCOUNTER — Inpatient Hospital Stay: Payer: Medicaid Other

## 2017-10-09 DIAGNOSIS — J9601 Acute respiratory failure with hypoxia: Secondary | ICD-10-CM

## 2017-10-09 LAB — CBC
HCT: 27.5 % — ABNORMAL LOW (ref 35.0–47.0)
Hemoglobin: 8.8 g/dL — ABNORMAL LOW (ref 12.0–16.0)
MCH: 28.5 pg (ref 26.0–34.0)
MCHC: 31.8 g/dL — ABNORMAL LOW (ref 32.0–36.0)
MCV: 89.6 fL (ref 80.0–100.0)
PLATELETS: 237 10*3/uL (ref 150–440)
RBC: 3.08 MIL/uL — ABNORMAL LOW (ref 3.80–5.20)
RDW: 14.7 % — AB (ref 11.5–14.5)
WBC: 13.3 10*3/uL — AB (ref 3.6–11.0)

## 2017-10-09 LAB — GLUCOSE, CAPILLARY
GLUCOSE-CAPILLARY: 118 mg/dL — AB (ref 65–99)
GLUCOSE-CAPILLARY: 118 mg/dL — AB (ref 65–99)
Glucose-Capillary: 117 mg/dL — ABNORMAL HIGH (ref 65–99)
Glucose-Capillary: 128 mg/dL — ABNORMAL HIGH (ref 65–99)
Glucose-Capillary: 130 mg/dL — ABNORMAL HIGH (ref 65–99)
Glucose-Capillary: 127 mg/dL — ABNORMAL HIGH (ref 65–99)

## 2017-10-09 MED ORDER — ERYTHROMYCIN ETHYLSUCCINATE 200 MG/5ML PO SUSR
400.0000 mg | Freq: Three times a day (TID) | ORAL | Status: DC
Start: 2017-10-09 — End: 2017-10-21
  Administered 2017-10-09 – 2017-10-21 (×34): 400 mg
  Filled 2017-10-09 (×37): qty 10
  Filled 2017-10-09: qty 5
  Filled 2017-10-09: qty 10

## 2017-10-09 MED ORDER — BISACODYL 10 MG RE SUPP
10.0000 mg | Freq: Every day | RECTAL | Status: DC | PRN
  Administered 2017-10-09 – 2017-11-24 (×4): 10 mg via RECTAL
  Filled 2017-10-09 (×3): qty 1

## 2017-10-09 MED ORDER — METOPROLOL TARTRATE 5 MG/5ML IV SOLN
5.0000 mg | Freq: Once | INTRAVENOUS | Status: AC
  Administered 2017-10-09: 5 mg via INTRAVENOUS
  Filled 2017-10-09: qty 5

## 2017-10-09 NOTE — Progress Notes (Signed)
[place] [facility] [place] NAME: Michelle [name]    MR#:  [id number]  DATE OF BIRTH:  Jul 27, 1960  SUBJECTIVE:  CHIEF COMPLAINT:   Chief Complaint  Patient presents with  . Shortness of Breath   - remains intubated, likely weaning trail today after sedation weaned off. - no fevers but hypothermic last night and on a bair hugger  REVIEW OF SYSTEMS:  Review of Systems  Unable to perform ROS: Critical illness    DRUG ALLERGIES:   Allergies  Allergen Reactions  . Carvedilol Other (See Comments)    Pt gets "sick" when on medication    VITALS:  Blood pressure 117/68, pulse (!) 55, temperature (!) 95.5 F (35.3 C), temperature source Bladder, resp. rate 16, height 4\' 9"  (1.448 m), weight 53.3 kg (117 lb 8.1 oz), last menstrual period 03/06/2005, SpO2 100 %.  PHYSICAL EXAMINATION:  Physical Exam  GENERAL:  57 y.o.-year-old patient lying in the bed with no acute distress.  EYES: Pupils equal, round, reactive to light and accommodation. No scleral icterus. Extraocular muscles intact.  HEENT: Head atraumatic, normocephalic. Oropharynx and nasopharynx clear.orally intubated and has OG tube  NECK:  Supple, no jugular venous distention. No thyroid enlargement, no tenderness.  LUNGS: Normal breath sounds bilaterally, no wheezing, rales,rhonchi or crepitation. No use of accessory muscles of respiration. Decreased bibasilar breath sounds CARDIOVASCULAR: S1, S2 normal. No murmurs, rubs, or gallops.  ABDOMEN: Soft, nontender, nondistended.  Hypoactive bowel sounds present. No organomegaly or mass.  EXTREMITIES: No cyanosis, or clubbing. 1-2+ edema of hands and feet edema NEUROLOGIC: intubated and sedated.  Restless movements in bed noted PSYCHIATRIC: The patient is sedated.  SKIN: No obvious rash, lesion, or ulcer.    LABORATORY PANEL:   CBC Recent Labs  Lab 10/09/17 0535  WBC 13.3*  HGB 8.8*  HCT 27.5*  PLT 237    ------------------------------------------------------------------------------------------------------------------  Chemistries  Recent Labs  Lab 10/07/17 0558 10/08/17 0433  NA 140 143  K 4.6 4.4  CL 102 101  CO2 33* 36*  GLUCOSE 178* 171*  BUN 28* 26*  CREATININE 0.60 0.57  CALCIUM 8.5* 8.9  MG 1.9  --    ------------------------------------------------------------------------------------------------------------------  Cardiac Enzymes No results for input(s): TROPONINI in the last 168 hours. ------------------------------------------------------------------------------------------------------------------  RADIOLOGY:  No results found.  EKG:   Orders placed or performed during the hospital encounter of 09/30/17  . EKG 12-Lead  . EKG 12-Lead  . EKG 12-Lead  . EKG 12-Lead    ASSESSMENT AND PLAN:   Michelle [name] a57 y.o.femalewith a known history of anxiety, CHF, cocaine abuse, COPD, coronary artery disease, hepatitis C, hypertension admitted for acute respiratory failure and intubated.  1.  Acute on chronic respiratory failure with hypoxia-secondary to acute on chronic COPD exacerbation -Remains on vent, continue weaning trial as tolerated -Appreciate pulmonary management  -On steroids, nebs and Zosyn for bronchitis/pneumonia -Vancomycin was added for fevers- but discontinued now- no further fevers- hypothermic last night - Needs to discuss about possibility of Trach with family if unable to be weaned off, as been on vent for 9 days  2.  Hypotension-secondary to sedation medications.  Not receiving levophed now.  3.  Anxiety-continue Xanax scheduled.  Also on Precedex, fentanyl, versed added and risperidone  4.  DVT prophylaxis-subcutaneous heparin  Discussed with ICU attending   All the records are reviewed and case discussed with Care Management/Social Workerr. Management plans discussed with the patient, family and they are in agreement.  CODE  STATUS: Full Code  TOTAL TIME TAKING CARE OF THIS PATIENT: 26 minutes.   POSSIBLE D/C IN ? DAYS, DEPENDING ON CLINICAL CONDITION.   Michelle [name] M.D on 10/09/2017 at 7:52 AM  Between 7am to 6pm - Pager - [phone]  After 6pm go to [email or web address] - password [email or web address] [facility]  Office  [phone]  CC: Primary care physician; Center, [name]

## 2017-10-09 NOTE — Progress Notes (Signed)
Pharmacy Consult:  Pharmacy consulted to assist with constipation management for 57 yo female admitted with COPD exacerbation requiring mechanical ventilation and sedation with fentanyl drip. Patient has not had a documented bowel movement this admission.    Plan: Continue senna/docusate 2 tabs VT BID. Will initiate erythromycin 400mg  VT Q8hr.   Pharmacy will continue to monitor and adjust per consult.   Zakari Bathe L 10/09/2017 10:05 PM

## 2017-10-09 NOTE — Progress Notes (Signed)
   10/09/17 1345  Clinical Encounter Type  Visited With Patient and family together  Visit Type Initial   Chaplain checked in with patient family regarding chaplain availability/family needs.  Family appeared tearful but declined visit at this time.  Chaplain educated family about ongoing chaplain support and asked permission to check in another time to which family agreed.

## 2017-10-09 NOTE — Progress Notes (Signed)
   10/09/17 0600  Vitals  Temp (!) 95.5 F (35.3 C)  Temp Source Bladder   Bair Hugger applied.

## 2017-10-09 NOTE — Progress Notes (Signed)
Follow up - Critical Care Medicine Note  Patient Details:    Michelle [name] is an 57 y.o. female.with a known history of anxiety, CHF, cocaine abuse, COPD, coronary artery disease, hepatitis C, hypertension, intubated on mechanical ventilation.   Lines, Airways, Drains: Airway 7.5 mm (Active)  Secured at (cm) 21 cm 10/07/2017  8:00 AM  Measured From Lips 10/07/2017  8:00 AM  Secured Location Left 10/07/2017  8:00 AM  Secured By [employer] 10/07/2017  8:00 AM  Tube Holder Repositioned Yes 10/07/2017  8:00 AM  Cuff Pressure (cm H2O) 25 cm H2O 10/07/2017  8:00 AM  Site Condition Dry 10/07/2017  8:00 AM     PICC Triple Lumen 10/04/17 PICC Right Brachial 36 cm 0 cm (Active)  Indication for Insertion or Continuance of Line Prolonged intravenous therapies 10/07/2017  7:44 AM  Exposed Catheter (cm) 0 cm 10/04/2017  5:00 PM  Site Assessment Clean;Dry;Intact 10/07/2017  7:30 AM  Lumen #1 Status Infusing;Flushed;Blood return noted 10/07/2017  7:30 AM  Lumen #2 Status Infusing;Flushed;Blood return noted 10/07/2017  7:30 AM  Lumen #3 Status Infusing;Flushed;Blood return noted 10/07/2017  7:30 AM  Dressing Type Transparent 10/07/2017  7:30 AM  Dressing Status Intact;Dry;Clean;Antimicrobial disc in place 10/07/2017  7:30 AM  Line Care Connections checked and tightened 10/07/2017  7:30 AM  Line Adjustment (NICU/IV Team Only) No 10/06/2017  2:15 PM  Dressing Intervention Dressing changed;Antimicrobial disc changed 10/07/2017  4:02 AM  Dressing Change Due 10/11/17 10/07/2017  7:30 AM     NG/OG Tube Orogastric 14 Fr. Left mouth Aucultation Documented cm marking at nare/ corner of mouth 55 cm (Active)  Cm Marking at Nare/Corner of Mouth (if applicable) 55 cm 01/05/1516  7:30 AM  External Length of Tube (cm) - (if applicable) 55 cm 11/03/6071  8:03 PM  Site Assessment Clean;Dry;Intact 10/07/2017  7:30 AM  Ongoing Placement Verification No change in respiratory status;No acute changes, not attributed to clinical condition;Xray;No  change in cm markings or external length of tube from initial placement 10/07/2017  7:30 AM  Status Infusing tube feed 10/07/2017  7:30 AM  Intake (mL) 40 mL 10/06/2017  2:00 PM     Urethral Catheter ANNA AND STUDENT CNA Latex 16 Fr. (Active)  Indication for Insertion or Continuance of Catheter Other (comment) 10/07/2017  7:30 AM  Site Assessment Clean;Intact;Dry 10/07/2017  7:30 AM  Catheter Maintenance Bag below level of bladder;Catheter secured;Drainage bag/tubing not touching floor;Insertion date on drainage bag;No dependent loops;Seal intact 10/07/2017  7:44 AM  Collection Container Standard drainage bag 10/07/2017  7:30 AM  Securement Method Securing device (Describe) 10/07/2017  7:30 AM  Urinary Catheter Interventions Unclamped 10/07/2017  7:30 AM  Output (mL) 550 mL 10/07/2017  8:00 AM    Anti-infectives:  Anti-infectives (From admission, onward)   Start     Dose/Rate Route Frequency Ordered Stop   10/07/17 0900  vancomycin (VANCOCIN) 500 mg in sodium chloride 0.9 % 100 mL IVPB  Status:  Discontinued     500 mg 100 mL/hr over 60 Minutes Intravenous Every 8 hours 10/07/17 0343 10/07/17 1033   10/06/17 0100  vancomycin (VANCOCIN) 500 mg in sodium chloride 0.9 % 100 mL IVPB  Status:  Discontinued     500 mg 100 mL/hr over 60 Minutes Intravenous Every 12 hours 10/05/17 1540 10/07/17 0343   10/05/17 1145  vancomycin (VANCOCIN) IVPB 1000 mg/200 mL premix     1,000 mg 200 mL/hr over 60 Minutes Intravenous  Once 10/05/17 1132 10/05/17 1401  10/01/17 2200  levofloxacin (LEVAQUIN) IVPB 750 mg  Status:  Discontinued     750 mg 100 mL/hr over 90 Minutes Intravenous Every 48 hours 10/01/17 2150 10/02/17 1054   10/01/17 1800  levofloxacin (LEVAQUIN) IVPB 500 mg  Status:  Discontinued     500 mg 100 mL/hr over 60 Minutes Intravenous Daily-1800 09/30/17 1853 10/01/17 0804   10/01/17 0900  piperacillin-tazobactam (ZOSYN) IVPB 3.375 g     3.375 g 12.5 mL/hr over 240 Minutes Intravenous Every 8 hours 10/01/17  0804 10/08/17 0545   09/30/17 1700  levofloxacin (LEVAQUIN) IVPB 750 mg     750 mg 100 mL/hr over 90 Minutes Intravenous  Once 09/30/17 1653 10/01/17 0819      Microbiology: Results for orders placed or performed during the hospital encounter of 09/30/17  CULTURE, BLOOD (ROUTINE X 2) w Reflex to ID Panel     Status: None   Collection Time: 09/30/17  5:43 PM  Result Value Ref Range Status   Specimen Description BLOOD CENTRAL LINE  Final   Special Requests   Final    BOTTLES DRAWN AEROBIC AND ANAEROBIC Blood Culture adequate volume   Culture   Final    NO GROWTH 5 DAYS Performed at [facility], [address] [place], [place] [address]    Report Status 10/05/2017 FINAL  Final  MRSA PCR Screening     Status: None   Collection Time: 09/30/17  8:02 PM  Result Value Ref Range Status   MRSA by PCR NEGATIVE NEGATIVE Final    Comment:        The GeneXpert MRSA Assay (FDA approved for NASAL specimens only), is one component of a comprehensive MRSA colonization surveillance program. It is not intended to diagnose MRSA infection nor to guide or monitor treatment for MRSA infections. Performed at [facility], [address], [place], [place] [address]   Urine Culture     Status: None   Collection Time: 09/30/17  8:04 PM  Result Value Ref Range Status   Specimen Description   Final    URINE, RANDOM Performed at [facility], [address] [place], [place] [address]    Special Requests   Final    NONE Performed at [facility], [address], [place], [place] [address]    Culture   Final    NO GROWTH Performed at [facility], [place] [address] [place], [place] [address]    Report Status 10/02/2017 FINAL  Final  CULTURE, BLOOD (ROUTINE X 2) w Reflex to ID Panel     Status: None   Collection Time: 09/30/17  8:16 PM  Result Value Ref Range Status   Specimen Description BLOOD L HAND  Final   Special Requests   Final    BOTTLES  DRAWN AEROBIC AND ANAEROBIC Blood Culture results may not be optimal due to an inadequate volume of blood received in culture bottles   Culture   Final    NO GROWTH 5 DAYS Performed at [facility], [address], [place], [place] [address]    Report Status 10/05/2017 FINAL  Final  Culture, respiratory (NON-Expectorated)     Status: None   Collection Time: 09/30/17  9:07 PM  Result Value Ref Range Status   Specimen Description   Final    TRACHEAL ASPIRATE Performed at [facility], [address] [place], [place] [address]    Special Requests   Final    NONE Performed at [facility], [place], [place] [address]    Gram Stain   Final    MODERATE WBC PRESENT, PREDOMINANTLY PMN FEW GRAM POSITIVE COCCI IN PAIRS IN CLUSTERS FEW GRAM NEGATIVE RODS    Culture   Final    FEW Consistent with normal respiratory flora. Performed at [facility], [place] [address] [place], [place] [address]    Report Status 10/03/2017 FINAL  Final  CULTURE, BLOOD (ROUTINE X 2) w Reflex to ID Panel     Status: None (Preliminary result)   Collection Time: 10/05/17 11:48 AM  Result Value Ref Range Status   Specimen Description BLOOD LAC  Final   Special Requests   Final    BOTTLES DRAWN AEROBIC AND ANAEROBIC Blood Culture adequate volume   Culture   Final    NO GROWTH 4 DAYS Performed at [facility], [address] [place], [place] [address]    Report Status PENDING  Incomplete  CULTURE, BLOOD (ROUTINE X 2) w Reflex to ID Panel     Status: None (Preliminary result)   Collection Time: 10/05/17 11:55 AM  Result Value Ref Range Status   Specimen Description BLOOD LT HAND  Final   Special Requests   Final    BOTTLES DRAWN AEROBIC AND ANAEROBIC Blood Culture adequate volume   Culture   Final    NO GROWTH 4 DAYS Performed at [facility], [address], [place], [place] [address]    Report Status PENDING  Incomplete  Culture,  expectorated sputum-assessment     Status: None   Collection Time: 10/05/17  2:18 PM  Result Value Ref Range Status   Specimen Description TRACHEAL ASPIRATE  Final   Special Requests NONE  Final   Sputum evaluation   Final    THIS SPECIMEN IS ACCEPTABLE FOR SPUTUM CULTURE Performed at [facility] [address], [place], [place] [address]    Report Status 10/05/2017 FINAL  Final  Culture, respiratory (NON-Expectorated)     Status: None   Collection Time: 10/05/17  2:18 PM  Result Value Ref Range Status   Specimen Description   Final    TRACHEAL ASPIRATE Performed at [facility], [address] [place], [place] [address]    Special Requests   Final    NONE Reflexed from [id number] Performed at [facility], [address] [place], [place] [address]    Gram Stain   Final    MODERATE WBC PRESENT,[name] PMN AND MONONUCLEAR NO SQUAMOUS EPITHELIAL CELLS SEEN FEW YEAST WITH PSEUDOHYPHAE RARE GRAM POSITIVE COCCI IN CLUSTERS Performed at [facility], [place] [address] [place], [place] [address]    Culture FEW CANDIDA ALBICANS FEW CANDIDA DUBLINIENSIS   Final   Report Status 10/08/2017 FINAL  Final    Studies: Dg Abd 1 View  Result Date: 10/06/2017 CLINICAL DATA:  Initial evaluation for ileus. EXAM: ABDOMEN - 1 VIEW COMPARISON:  Prior radiograph from 10/04/2017. FINDINGS: Enteric tube in place with tip overlying the gastric body, side hole at or just beyond the GE junction. Few mildly prominent gas-filled loops of bowel overlie the mid and left abdomen without overt evidence for obstruction. No abnormal bowel wall thickening. No soft tissue mass or abnormal calcification. Tubal ligation clips overlie the pelvis. Visualized lung bases are clear. IMPRESSION: 1. Few mildly prominent gas-filled loops of bowel overlying the mid and left abdomen without evidence for obstruction. 2. Enteric tube in place with tip overlying the gastric body,  side hole at or just  beyond the level of the GE junction. Electronically Signed   By: Jeannine Boga M.D.   On: 10/06/2017 13:13   Dg Abd 1 View  Result Date: 10/04/2017 CLINICAL DATA:  History of pneumonia, history of acute renal injury, smoking history EXAM: ABDOMEN - 1 VIEW COMPARISON:  Portable chest x-ray of 10/03/2017 FINDINGS: A supine view of the abdomen shows an NG tube extending into the proximal body of the stomach. The bowel gas pattern is nonspecific. No bowel obstruction is seen. A vascular sheath overlies the right pelvis. No bony abnormality is noted. IMPRESSION: 1. NG tube extends to a point overlying the proximal body of the stomach. 2. No bowel obstruction. Electronically Signed   By: Ivar Drape M.D.   On: 10/04/2017 09:54   Dg Chest Port 1 View  Result Date: 10/07/2017 CLINICAL DATA:  Atelectasis. EXAM: PORTABLE CHEST 1 VIEW COMPARISON:  Oct 06, 2017 FINDINGS: The ETT is in good position. The NG tube terminates below today's film. A right PICC line terminates in the central SVC, stable. No pneumothorax. The cardiomediastinal silhouette is normal. Haziness in the medial right lung base may represent vascular crowding/overlying soft tissue given positioning. Infiltrate considered less likely. No other interval changes. IMPRESSION: 1. Support apparatus as above. 2. There is haziness over the medial right lung base. I suspect this is either vascular crowding or overlying soft tissue attenuation given positioning. Subtle infiltrate considered less likely. Recommend attention on follow-up. Electronically Signed   By: Dorise Bullion III M.D   On: 10/07/2017 09:01   Dg Chest Port 1 View  Result Date: 10/06/2017 CLINICAL DATA:  Pneumonia EXAM: PORTABLE CHEST 1 VIEW COMPARISON:  10/04/2017 chest radiograph. FINDINGS: Endotracheal tube tip is 6.3 cm above the carina. Enteric tube enters stomach with the tip not seen on this image. Right PICC terminates in the lower third of the SVC. Stable cardiomediastinal  silhouette with normal heart size. No pneumothorax. No pleural effusion. Hyperinflated lungs. No pulmonary edema. Mild left basilar scarring versus atelectasis. No acute consolidative airspace disease. IMPRESSION: 1. Well-positioned support structures as detailed. 2. Hyperinflated lungs, suggesting COPD. 3. Stable mild left basilar scarring versus atelectasis. Electronically Signed   By: Ilona Sorrel M.D.   On: 10/06/2017 07:15   Dg Chest Port 1 View  Result Date: 10/04/2017 CLINICAL DATA:  Pneumonia EXAM: PORTABLE CHEST 1 VIEW COMPARISON:  10/03/2017 FINDINGS: Endotracheal tube and NG tube are unchanged. Hyperinflation of the lungs. Heart is normal size. Minimal left base atelectasis. No effusions. Right lung clear. No acute bony abnormality. IMPRESSION: Hyperinflation/COPD.  Left base atelectasis. Electronically Signed   By: Rolm Baptise M.D.   On: 10/04/2017 09:51   Dg Chest Port 1 View  Result Date: 10/03/2017 CLINICAL DATA:  Atelectasis EXAM: PORTABLE CHEST 1 VIEW COMPARISON:  10/02/2017 FINDINGS: Endotracheal tube in good position.  NG tube in the stomach. COPD with hyperinflation. Negative for infiltrate effusion or edema. IMPRESSION: Endotracheal tube in good position. COPD.  The lungs remain clear. Electronically Signed   By: Franchot Gallo M.D.   On: 10/03/2017 09:16   Dg Chest Port 1 View  Result Date: 10/02/2017 CLINICAL DATA:  Hypoxia EXAM: PORTABLE CHEST 1 VIEW COMPARISON:  September 30, 2017 FINDINGS: Endotracheal tube tip is 4.5 cm above the carina. Nasogastric tube tip and side port are below the diaphragm. No pneumothorax. Lungs are hyperexpanded. There is no edema or consolidation. The heart size is normal. Pulmonary vascularity is within normal limits. There  is aortic atherosclerosis. No adenopathy. No bone lesions. IMPRESSION: Tube positions as described without pneumothorax. Lungs hyperexpanded without edema or consolidation. Stable cardiac silhouette. There is aortic atherosclerosis.  Aortic Atherosclerosis (ICD10-I70.0). Electronically Signed   By: Lowella Grip III M.D.   On: 10/02/2017 07:31   Dg Chest Portable 1 View  Result Date: 09/30/2017 CLINICAL DATA:  Orogastric tube placement EXAM: PORTABLE CHEST 1 VIEW COMPARISON:  Earlier today FINDINGS: An orogastric tube tip reaches the stomach. The side port is near the GE junction. Endotracheal tube tip between the clavicular heads and carina. Large lung volumes with lucent appearance. There is no edema, consolidation, effusion, or pneumothorax. Normal heart size and mediastinal contours. Artifact from EKG leads. IMPRESSION: The new orogastric tube is in good position. Otherwise stable from earlier today. Electronically Signed   By: Monte Fantasia M.D.   On: 09/30/2017 18:48   Dg Chest Port 1 View  Result Date: 09/30/2017 CLINICAL DATA:  Status post intubation. EXAM: PORTABLE CHEST 1 VIEW COMPARISON:  September 30, 2017 FINDINGS: The ETT is in good position. No pneumothorax. Tiny effusions with atelectasis. No other changes. Emphysema. IMPRESSION: 1. The ETT is in good position.  No other changes. Electronically Signed   By: Dorise Bullion III M.D   On: 09/30/2017 17:28   Dg Chest Port 1 View  Result Date: 09/30/2017 CLINICAL DATA:  Shortness of breath. EXAM: PORTABLE CHEST 1 VIEW COMPARISON:  CT scan August 23, 2017.  Chest x-ray August 20, 2017. FINDINGS: Hyperexpansion of the lungs consistent with emphysema. Tiny pleural effusions and underlying atelectasis. No other interval changes. IMPRESSION: Emphysema.  Tiny pleural effusions and underlying atelectasis. Electronically Signed   By: Dorise Bullion III M.D   On: 09/30/2017 17:27   [place] Ekg Site Rite  Result Date: 10/04/2017 If Site Rite image not attached, placement could not be confirmed due to current cardiac rhythm.   Consults: Treatment Team:  Pccm, Ander Gaster, MD   Subjective:    Overnight Issues: patient has had difficulty with agitation on mechanical  ventilation to include [name] hypo-and hypertension along with severe tachycardia. Presently resting on propofol and fentanyl. Patient has not wean amenable at this time  Objective:  Vital signs for last 24 hours: Temp:  [95.4 F (35.2 C)-99.3 F (37.4 C)] 97.9 F (36.6 C) (05/08 0800) Pulse Rate:  [50-112] 59 (05/08 0800) Resp:  [16-18] 16 (05/08 0700) BP: (83-159)/(58-79) 106/67 (05/08 0800) SpO2:  [90 %-100 %] 100 % (05/08 0907) FiO2 (%):  [40 %] 40 % (05/08 0907) Weight:  [117 lb 8.1 oz (53.3 kg)] 117 lb 8.1 oz (53.3 kg) (05/08 0220)  Hemodynamic parameters for last 24 hours:    Intake/Output from previous day: 05/07 0701 - 05/08 0700 In: 1679.5 [I.V.:1261.8; NG/GT:417.7] Out: 1800 [Urine:1800]  Intake/Output this shift: Total I/O In: 253.9 [I.V.:253.9] Out: -   Vent settings for last 24 hours: Vent Mode: PRVC FiO2 (%):  [40 %] 40 % Set Rate:  [16 bmp] 16 bmp Vt Set:  [450 mL] 450 mL PEEP:  [5 cmH20] 5 cmH20 Plateau Pressure:  [20 cmH20-22 cmH20] 20 cmH20  Physical Exam:  Presently patient is resting comfortably on mechanical ventilation. She is sedated and unresponsive Vital signs: Please see the above listed vital signs HEENT: Trachea is midline, oral endotracheal tube in place, no accessory muscle utilization, no jugular venous distention is noted Cardiovascular: Regular rate and rhythm, sinus mechanism noted on monitor Pulmonary: Prolonged expiratory phase, diffuse expiratory rhonchi and wheezes noted Abdominal: Hypoactive  bowel sounds, generally soft exam Tremors: No clubbing cyanosis or edema noted Neurologic: Patient is sedated limited exam  Assessment/Plan:   Severe respiratory failure with acute bronchospasm. Patient is presently on Solu-Medrol, albuterol, Atrovent, completed antibiotics,sedation has been adjusted to include propofol and fentanyl. Will attempt spontaneous awakening and breathing trial today. If unsuccessful will discuss with family regarding  tracheostomy and PEG tube placement versus comfort  Anemia: No evidence of active bleeding  Leukocytosis. 13.3  Hyperglycemia. On sliding scale  Critical Care Total Time 40 minutes  Mariangela Heldt 10/09/2017  *Care during the described time interval was provided by me and/or other providers on the critical care team.  I have reviewed this patient's available data, including medical history, events of note, physical examination and test results as part of my evaluation. Patient ID: Michelle [name], female   DOB: Dec 24, 1960, 57 y.o.   MRN: [id number] Patient ID: Michelle [name], female   DOB: 1960/10/17, 57 y.o.   MRN: [id number]

## 2017-10-09 NOTE — Progress Notes (Signed)
Spoke with family [name], [place], and Freda Munro) about setting up a family meeting. As of right now, family meeting will be at 13:00.

## 2017-10-09 NOTE — Progress Notes (Signed)
   10/09/17 0500  Vitals  Temp (!) 95.4 F (35.2 C)  Temp Source Bladder   Heated blankets applied.

## 2017-10-10 LAB — CBC WITH DIFFERENTIAL/PLATELET
BLASTS: 0 %
Band Neutrophils: 9 %
Basophils Absolute: 0 10*3/uL (ref 0–0.1)
Basophils Relative: 0 %
Eosinophils Absolute: 0 10*3/uL (ref 0–0.7)
Eosinophils Relative: 0 %
HEMATOCRIT: 25.3 % — AB (ref 35.0–47.0)
HEMOGLOBIN: 8.3 g/dL — AB (ref 12.0–16.0)
LYMPHS PCT: 6 %
Lymphs Abs: 0.9 10*3/uL — ABNORMAL LOW (ref 1.0–3.6)
MCH: 29.4 pg (ref 26.0–34.0)
MCHC: 32.7 g/dL (ref 32.0–36.0)
MCV: 90 fL (ref 80.0–100.0)
Metamyelocytes Relative: 5 %
Monocytes Absolute: 0.1 10*3/uL — ABNORMAL LOW (ref 0.2–0.9)
Monocytes Relative: 1 %
Myelocytes: 0 %
NEUTROS PCT: 79 %
NRBC: 0 /100{WBCs}
Neutro Abs: 13.3 10*3/uL — ABNORMAL HIGH (ref 1.4–6.5)
OTHER: 0 %
PROMYELOCYTES RELATIVE: 0 %
Platelets: 223 10*3/uL (ref 150–440)
RBC: 2.81 MIL/uL — ABNORMAL LOW (ref 3.80–5.20)
RDW: 15.4 % — ABNORMAL HIGH (ref 11.5–14.5)
WBC: 14.3 10*3/uL — ABNORMAL HIGH (ref 3.6–11.0)

## 2017-10-10 LAB — GLUCOSE, CAPILLARY
GLUCOSE-CAPILLARY: 128 mg/dL — AB (ref 65–99)
GLUCOSE-CAPILLARY: 119 mg/dL — AB (ref 65–99)
Glucose-Capillary: 151 mg/dL — ABNORMAL HIGH (ref 65–99)
Glucose-Capillary: 145 mg/dL — ABNORMAL HIGH (ref 65–99)
Glucose-Capillary: 126 mg/dL — ABNORMAL HIGH (ref 65–99)

## 2017-10-10 LAB — CULTURE, BLOOD (ROUTINE X 2)
CULTURE: NO GROWTH
Culture: NO GROWTH
SPECIAL REQUESTS: ADEQUATE
SPECIAL REQUESTS: ADEQUATE

## 2017-10-10 LAB — TRIGLYCERIDES: TRIGLYCERIDES: 436 mg/dL — AB (ref <150)

## 2017-10-10 MED ORDER — MIDAZOLAM BOLUS VIA INFUSION
2.0000 mg | INTRAVENOUS | Status: DC | PRN
  Administered 2017-10-11 – 2017-10-19 (×20): 2 mg via INTRAVENOUS
  Administered 2017-10-19: 0 mg via INTRAVENOUS
  Filled 2017-10-10: qty 2

## 2017-10-10 MED ORDER — PROPOFOL 1000 MG/100ML IV EMUL
INTRAVENOUS | Status: AC
  Filled 2017-10-10: qty 100

## 2017-10-10 MED ORDER — MIDAZOLAM HCL 50 MG/10ML IJ SOLN
0.5000 mg/h | INTRAMUSCULAR | Status: AC
  Administered 2017-10-10: 0.5 mg/h via INTRAVENOUS
  Administered 2017-10-11 – 2017-10-12 (×2): 2 mg/h via INTRAVENOUS
  Administered 2017-10-13: 1 mg/h via INTRAVENOUS
  Administered 2017-10-14: 6 mg/h via INTRAVENOUS
  Administered 2017-10-15: 4 mg/h via INTRAVENOUS
  Administered 2017-10-15: 3 mg/h via INTRAVENOUS
  Administered 2017-10-16 – 2017-10-17 (×3): 4 mg/h via INTRAVENOUS
  Administered 2017-10-17 – 2017-10-18 (×2): 3 mg/h via INTRAVENOUS
  Filled 2017-10-10 (×12): qty 10

## 2017-10-10 NOTE — Progress Notes (Signed)
Follow up - Critical Care Medicine Note  Patient Details:    Michelle [name] is an 57 y.o. female.with a known history of anxiety, CHF, cocaine abuse, COPD, coronary artery disease, hepatitis C, hypertension, intubated on mechanical ventilation.   Lines, Airways, Drains: Airway 7.5 mm (Active)  Secured at (cm) 21 cm 10/07/2017  8:00 AM  Measured From Lips 10/07/2017  8:00 AM  Secured Location Left 10/07/2017  8:00 AM  Secured By [employer] 10/07/2017  8:00 AM  Tube Holder Repositioned Yes 10/07/2017  8:00 AM  Cuff Pressure (cm H2O) 25 cm H2O 10/07/2017  8:00 AM  Site Condition Dry 10/07/2017  8:00 AM     PICC Triple Lumen 10/04/17 PICC Right Brachial 36 cm 0 cm (Active)  Indication for Insertion or Continuance of Line Prolonged intravenous therapies 10/07/2017  7:44 AM  Exposed Catheter (cm) 0 cm 10/04/2017  5:00 PM  Site Assessment Clean;Dry;Intact 10/07/2017  7:30 AM  Lumen #1 Status Infusing;Flushed;Blood return noted 10/07/2017  7:30 AM  Lumen #2 Status Infusing;Flushed;Blood return noted 10/07/2017  7:30 AM  Lumen #3 Status Infusing;Flushed;Blood return noted 10/07/2017  7:30 AM  Dressing Type Transparent 10/07/2017  7:30 AM  Dressing Status Intact;Dry;Clean;Antimicrobial disc in place 10/07/2017  7:30 AM  Line Care Connections checked and tightened 10/07/2017  7:30 AM  Line Adjustment (NICU/IV Team Only) No 10/06/2017  2:15 PM  Dressing Intervention Dressing changed;Antimicrobial disc changed 10/07/2017  4:02 AM  Dressing Change Due 10/11/17 10/07/2017  7:30 AM     NG/OG Tube Orogastric 14 Fr. Left mouth Aucultation Documented cm marking at nare/ corner of mouth 55 cm (Active)  Cm Marking at Nare/Corner of Mouth (if applicable) 55 cm 11/05/158  7:30 AM  External Length of Tube (cm) - (if applicable) 55 cm 1/0/9323  8:03 PM  Site Assessment Clean;Dry;Intact 10/07/2017  7:30 AM  Ongoing Placement Verification No change in respiratory status;No acute changes, not attributed to clinical condition;Xray;No  change in cm markings or external length of tube from initial placement 10/07/2017  7:30 AM  Status Infusing tube feed 10/07/2017  7:30 AM  Intake (mL) 40 mL 10/06/2017  2:00 PM     Urethral Catheter Michelle [name] Latex 16 Fr. (Active)  Indication for Insertion or Continuance of Catheter Other (comment) 10/07/2017  7:30 AM  Site Assessment Clean;Intact;Dry 10/07/2017  7:30 AM  Catheter Maintenance Bag below level of bladder;Catheter secured;Drainage bag/tubing not touching floor;Insertion date on drainage bag;No dependent loops;Seal intact 10/07/2017  7:44 AM  Collection Container Standard drainage bag 10/07/2017  7:30 AM  Securement Method Securing device (Describe) 10/07/2017  7:30 AM  Urinary Catheter Interventions Unclamped 10/07/2017  7:30 AM  Output (mL) 550 mL 10/07/2017  8:00 AM    Anti-infectives:  Anti-infectives (From admission, onward)   Start     Dose/Rate Route Frequency Ordered Stop   10/09/17 1100  erythromycin ethylsuccinate (EES) 200 MG/5ML suspension 400 mg     400 mg Per Tube Every 8 hours 10/09/17 1028     10/07/17 0900  vancomycin (VANCOCIN) 500 mg in sodium chloride 0.9 % 100 mL IVPB  Status:  Discontinued     500 mg 100 mL/hr over 60 Minutes Intravenous Every 8 hours 10/07/17 0343 10/07/17 1033   10/06/17 0100  vancomycin (VANCOCIN) 500 mg in sodium chloride 0.9 % 100 mL IVPB  Status:  Discontinued     500 mg 100 mL/hr over 60 Minutes Intravenous Every 12 hours 10/05/17 1540 10/07/17 0343   10/05/17 1145  vancomycin (VANCOCIN) IVPB 1000 mg/200 mL premix     1,000 mg 200 mL/hr over 60 Minutes Intravenous  Once 10/05/17 1132 10/05/17 1401   10/01/17 2200  levofloxacin (LEVAQUIN) IVPB 750 mg  Status:  Discontinued     750 mg 100 mL/hr over 90 Minutes Intravenous Every 48 hours 10/01/17 2150 10/02/17 1054   10/01/17 1800  levofloxacin (LEVAQUIN) IVPB 500 mg  Status:  Discontinued     500 mg 100 mL/hr over 60 Minutes Intravenous Daily-1800 09/30/17 1853 10/01/17 0804    10/01/17 0900  piperacillin-tazobactam (ZOSYN) IVPB 3.375 g     3.375 g 12.5 mL/hr over 240 Minutes Intravenous Every 8 hours 10/01/17 0804 10/08/17 0545   09/30/17 1700  levofloxacin (LEVAQUIN) IVPB 750 mg     750 mg 100 mL/hr over 90 Minutes Intravenous  Once 09/30/17 1653 10/01/17 0819      Microbiology: Results for orders placed or performed during the hospital encounter of 09/30/17  CULTURE, BLOOD (ROUTINE X 2) w Reflex to ID Panel     Status: None   Collection Time: 09/30/17  5:43 PM  Result Value Ref Range Status   Specimen Description BLOOD CENTRAL LINE  Final   Special Requests   Final    BOTTLES DRAWN AEROBIC AND ANAEROBIC Blood Culture adequate volume   Culture   Final    NO GROWTH 5 DAYS Performed at [facility], [address] [place], [place] [address]    Report Status 10/05/2017 FINAL  Final  MRSA PCR Screening     Status: None   Collection Time: 09/30/17  8:02 PM  Result Value Ref Range Status   MRSA by PCR NEGATIVE NEGATIVE Final    Comment:        The GeneXpert MRSA Assay (FDA approved for NASAL specimens only), is one component of a comprehensive MRSA colonization surveillance program. It is not intended to diagnose MRSA infection nor to guide or monitor treatment for MRSA infections. Performed at [facility], [address], [place], [place] [address]   Urine Culture     Status: None   Collection Time: 09/30/17  8:04 PM  Result Value Ref Range Status   Specimen Description   Final    URINE, RANDOM Performed at [facility], [address] [place], [place] [address]    Special Requests   Final    NONE Performed at [facility], [address], [place], [place] [address]    Culture   Final    NO GROWTH Performed at [facility], [place] [address], [place], [place] [address]    Report Status 10/02/2017 FINAL  Final  CULTURE, BLOOD (ROUTINE X 2) w Reflex to ID Panel     Status: None   Collection  Time: 09/30/17  8:16 PM  Result Value Ref Range Status   Specimen Description BLOOD L HAND  Final   Special Requests   Final    BOTTLES DRAWN AEROBIC AND ANAEROBIC Blood Culture results may not be optimal due to an inadequate volume of blood received in culture bottles   Culture   Final    NO GROWTH 5 DAYS Performed at [facility], [address] [place], [place] [address]    Report Status 10/05/2017 FINAL  Final  Culture, respiratory (NON-Expectorated)     Status: None   Collection Time: 09/30/17  9:07 PM  Result Value Ref Range Status   Specimen Description   Final    TRACHEAL ASPIRATE  Performed at [facility], [address], [place], [place] [address]    Special Requests   Final    NONE Performed at [facility], [place], [place] [address]    Gram Stain   Final    MODERATE WBC PRESENT, PREDOMINANTLY PMN FEW GRAM POSITIVE COCCI IN PAIRS IN CLUSTERS FEW GRAM NEGATIVE RODS    Culture   Final    FEW Consistent with normal respiratory flora. Performed at [facility], [place] [address] [place], [place] [address]    Report Status 10/03/2017 FINAL  Final  CULTURE, BLOOD (ROUTINE X 2) w Reflex to ID Panel     Status: None (Preliminary result)   Collection Time: 10/05/17 11:48 AM  Result Value Ref Range Status   Specimen Description BLOOD LAC  Final   Special Requests   Final    BOTTLES DRAWN AEROBIC AND ANAEROBIC Blood Culture adequate volume   Culture   Final    NO GROWTH 4 DAYS Performed at [facility], [address], [place], [place] [address]    Report Status PENDING  Incomplete  CULTURE, BLOOD (ROUTINE X 2) w Reflex to ID Panel     Status: None (Preliminary result)   Collection Time: 10/05/17 11:55 AM  Result Value Ref Range Status   Specimen Description BLOOD LT HAND  Final   Special Requests   Final    BOTTLES DRAWN AEROBIC AND ANAEROBIC Blood Culture adequate volume   Culture   Final    NO GROWTH 4  DAYS Performed at [facility], [address], [place], [place] [address]    Report Status PENDING  Incomplete  Culture, expectorated sputum-assessment     Status: None   Collection Time: 10/05/17  2:18 PM  Result Value Ref Range Status   Specimen Description TRACHEAL ASPIRATE  Final   Special Requests NONE  Final   Sputum evaluation   Final    THIS SPECIMEN IS ACCEPTABLE FOR SPUTUM CULTURE Performed at [facility], [address], [place], [place] [address]    Report Status 10/05/2017 FINAL  Final  Culture, respiratory (NON-Expectorated)     Status: None   Collection Time: 10/05/17  2:18 PM  Result Value Ref Range Status   Specimen Description   Final    TRACHEAL ASPIRATE Performed at [facility], [address] [place], [place] [address]    Special Requests   Final    NONE Reflexed from [phone] Performed at [facility], [address] [place], [place] [address]    Gram Stain   Final    MODERATE WBC PRESENT,BOTH PMN AND MONONUCLEAR NO SQUAMOUS EPITHELIAL CELLS SEEN FEW YEAST WITH PSEUDOHYPHAE RARE GRAM POSITIVE COCCI IN CLUSTERS Performed at [facility], [place] [address], [place], [place] [address]    Culture FEW CANDIDA ALBICANS FEW CANDIDA DUBLINIENSIS   Final   Report Status 10/08/2017 FINAL  Final    Studies: Dg Abd 1 View  Result Date: 10/06/2017 CLINICAL DATA:  Initial evaluation for ileus. EXAM: ABDOMEN - 1 VIEW COMPARISON:  Prior radiograph from 10/04/2017. FINDINGS: Enteric tube in place with tip overlying the gastric body, side hole at or just beyond the GE junction. Few mildly prominent gas-filled loops of bowel overlie the mid and left abdomen without overt evidence for obstruction. No abnormal bowel wall thickening. No soft tissue mass or abnormal calcification. Tubal ligation clips overlie the pelvis. Visualized lung bases are clear. IMPRESSION: 1. Few  mildly prominent gas-filled loops of bowel overlying the mid  and left abdomen without evidence for obstruction. 2. Enteric tube in place with tip overlying the gastric body, side hole at or just beyond the level of the GE junction. Electronically Signed   By: Jeannine Boga M.D.   On: 10/06/2017 13:13   Dg Abd 1 View  Result Date: 10/04/2017 CLINICAL DATA:  History of pneumonia, history of acute renal injury, smoking history EXAM: ABDOMEN - 1 VIEW COMPARISON:  Portable chest x-ray of 10/03/2017 FINDINGS: A supine view of the abdomen shows an NG tube extending into the proximal body of the stomach. The bowel gas pattern is nonspecific. No bowel obstruction is seen. A vascular sheath overlies the right pelvis. No bony abnormality is noted. IMPRESSION: 1. NG tube extends to a point overlying the proximal body of the stomach. 2. No bowel obstruction. Electronically Signed   By: Ivar Drape M.D.   On: 10/04/2017 09:54   Dg Chest Port 1 View  Result Date: 10/09/2017 CLINICAL DATA:  Intubated. EXAM: PORTABLE CHEST 1 VIEW COMPARISON:  10/07/2017 FINDINGS: Endotracheal tube tip is 6 cm above the carina. Nasogastric tube enters the abdomen. Right arm PICC tip in the SVC at the azygos level. Emphysematous changes of the lungs as noted previously. No consolidation, collapse or effusion. IMPRESSION: Lines and tubes well positioned. Emphysema. No focal finding otherwise. Electronically Signed   By: Nelson Chimes M.D.   On: 10/09/2017 11:54   Dg Chest Port 1 View  Result Date: 10/07/2017 CLINICAL DATA:  Atelectasis. EXAM: PORTABLE CHEST 1 VIEW COMPARISON:  Oct 06, 2017 FINDINGS: The ETT is in good position. The NG tube terminates below today's film. A right PICC line terminates in the central SVC, stable. No pneumothorax. The cardiomediastinal silhouette is normal. Haziness in the medial right lung base may represent vascular crowding/overlying soft tissue given positioning. Infiltrate considered less likely. No other interval changes. IMPRESSION: 1. Support apparatus as above.  2. There is haziness over the medial right lung base. I suspect this is either vascular crowding or overlying soft tissue attenuation given positioning. Subtle infiltrate considered less likely. Recommend attention on follow-up. Electronically Signed   By: Dorise Bullion III M.D   On: 10/07/2017 09:01   Dg Chest Port 1 View  Result Date: 10/06/2017 CLINICAL DATA:  Pneumonia EXAM: PORTABLE CHEST 1 VIEW COMPARISON:  10/04/2017 chest radiograph. FINDINGS: Endotracheal tube tip is 6.3 cm above the carina. Enteric tube enters stomach with the tip not seen on this image. Right PICC terminates in the lower third of the SVC. Stable cardiomediastinal silhouette with normal heart size. No pneumothorax. No pleural effusion. Hyperinflated lungs. No pulmonary edema. Mild left basilar scarring versus atelectasis. No acute consolidative airspace disease. IMPRESSION: 1. Well-positioned support structures as detailed. 2. Hyperinflated lungs, suggesting COPD. 3. Stable mild left basilar scarring versus atelectasis. Electronically Signed   By: Ilona Sorrel M.D.   On: 10/06/2017 07:15   Dg Chest Port 1 View  Result Date: 10/04/2017 CLINICAL DATA:  Pneumonia EXAM: PORTABLE CHEST 1 VIEW COMPARISON:  10/03/2017 FINDINGS: Endotracheal tube and NG tube are unchanged. Hyperinflation of the lungs. Heart is normal size. Minimal left base atelectasis. No effusions. Right lung clear. No acute bony abnormality. IMPRESSION: Hyperinflation/COPD.  Left base atelectasis. Electronically Signed   By: Rolm Baptise M.D.   On: 10/04/2017 09:51   Dg Chest Port 1 View  Result Date: 10/03/2017 CLINICAL DATA:  Atelectasis EXAM: PORTABLE CHEST 1 VIEW COMPARISON:  10/02/2017 FINDINGS:  Endotracheal tube in good position.  NG tube in the stomach. COPD with hyperinflation. Negative for infiltrate effusion or edema. IMPRESSION: Endotracheal tube in good position. COPD.  The lungs remain clear. Electronically Signed   By: Franchot Gallo M.D.   On:  10/03/2017 09:16   Dg Chest Port 1 View  Result Date: 10/02/2017 CLINICAL DATA:  Hypoxia EXAM: PORTABLE CHEST 1 VIEW COMPARISON:  September 30, 2017 FINDINGS: Endotracheal tube tip is 4.5 cm above the carina. Nasogastric tube tip and side port are below the diaphragm. No pneumothorax. Lungs are hyperexpanded. There is no edema or consolidation. The heart size is normal. Pulmonary vascularity is within normal limits. There is aortic atherosclerosis. No adenopathy. No bone lesions. IMPRESSION: Tube positions as described without pneumothorax. Lungs hyperexpanded without edema or consolidation. Stable cardiac silhouette. There is aortic atherosclerosis. Aortic Atherosclerosis (ICD10-I70.0). Electronically Signed   By: Lowella Grip III M.D.   On: 10/02/2017 07:31   Dg Chest Portable 1 View  Result Date: 09/30/2017 CLINICAL DATA:  Orogastric tube placement EXAM: PORTABLE CHEST 1 VIEW COMPARISON:  Earlier today FINDINGS: An orogastric tube tip reaches the stomach. The side port is near the GE junction. Endotracheal tube tip between the clavicular heads and carina. Large lung volumes with lucent appearance. There is no edema, consolidation, effusion, or pneumothorax. Normal heart size and mediastinal contours. Artifact from EKG leads. IMPRESSION: The new orogastric tube is in good position. Otherwise stable from earlier today. Electronically Signed   By: Monte Fantasia M.D.   On: 09/30/2017 18:48   Dg Chest Port 1 View  Result Date: 09/30/2017 CLINICAL DATA:  Status post intubation. EXAM: PORTABLE CHEST 1 VIEW COMPARISON:  September 30, 2017 FINDINGS: The ETT is in good position. No pneumothorax. Tiny effusions with atelectasis. No other changes. Emphysema. IMPRESSION: 1. The ETT is in good position.  No other changes. Electronically Signed   By: Dorise Bullion III M.D   On: 09/30/2017 17:28   Dg Chest Port 1 View  Result Date: 09/30/2017 CLINICAL DATA:  Shortness of breath. EXAM: PORTABLE CHEST 1 VIEW  COMPARISON:  CT scan August 23, 2017.  Chest x-ray August 20, 2017. FINDINGS: Hyperexpansion of the lungs consistent with emphysema. Tiny pleural effusions and underlying atelectasis. No other interval changes. IMPRESSION: Emphysema.  Tiny pleural effusions and underlying atelectasis. Electronically Signed   By: Dorise Bullion III M.D   On: 09/30/2017 17:27   [place] Ekg Site Rite  Result Date: 10/04/2017 If Site Rite image not attached, placement could not be confirmed due to current cardiac rhythm.   Consults: Treatment Team:  Pccm, Ander Gaster, MD   Subjective:    Overnight Issues:patient failed SBT/SAT yesterday. Discussion with family regarding possible tracheostomy  Objective:  Vital signs for last 24 hours: Temp:  [96.8 F (36 C)-100 F (37.8 C)] 97.2 F (36.2 C) (05/09 0800) Pulse Rate:  [57-122] 57 (05/09 0800) Resp:  [0-17] 16 (05/09 0800) BP: (85-187)/(52-100) 99/70 (05/09 0800) SpO2:  [89 %-100 %] 98 % (05/09 0800) FiO2 (%):  [30 %-40 %] 30 % (05/09 0800) Weight:  [116 lb 13.5 oz (53 kg)] 116 lb 13.5 oz (53 kg) (05/09 0322)  Hemodynamic parameters for last 24 hours:    Intake/Output from previous day: 05/08 0701 - 05/09 0700 In: 1482.2 [I.V.:902.2; NG/GT:580] Out: 2010 [Urine:2010]  Intake/Output this shift: Total I/O In: 287.5 [I.V.:187.5; NG/GT:100] Out: -   Vent settings for last 24 hours: Vent Mode: PRVC FiO2 (%):  [30 %-40 %] 30 % Set  Rate:  [16 bmp] 16 bmp Vt Set:  [450 mL] 450 mL PEEP:  [5 cmH20] 5 cmH20 Pressure Support:  [10 cmH20-15 cmH20] 10 cmH20 Plateau Pressure:  [18 cmH20] 18 cmH20  Physical Exam:  Presently patient is resting comfortably on mechanical ventilation. She is sedated and unresponsive Vital signs: Please see the above listed vital signs HEENT: Trachea is midline, oral endotracheal tube in place, no accessory muscle utilization, no jugular venous distention is noted Cardiovascular: Regular rate and rhythm, sinus mechanism noted on  monitor Pulmonary: Prolonged expiratory phase, diffuse expiratory rhonchi and wheezes noted Abdominal: Hypoactive bowel sounds, generally soft exam Tremors: No clubbing cyanosis or edema noted Neurologic: Patient is sedated limited exam  Assessment/Plan:   Severe respiratory failure with acute bronchospasm. Patient is presently on Solu-Medrol, albuterol, Atrovent, completed antibiotics,sedation has been adjusted to include propofol and fentanyl. Will attempt spontaneous awakening and breathing trial today. If unsuccessful will discuss with family regarding tracheostomy and PEG tube placement versus comfort  Anemia: No evidence of active bleeding  Leukocytosis. 14.3patient afebrile, no change in chest x-ray or evidence of active bleeding  Hyperglycemia. On sliding scale  Critical Care Total Time 40 minutes  Terren Jandreau 10/10/2017  *Care during the described time interval was provided by me and/or other providers on the critical care team.  I have reviewed this patient's available data, including medical history, events of note, physical examination and test results as part of my evaluation. Patient ID: Michelle [name], female   DOB: 11-Feb-1961, 57 y.o.   MRN: [id number] Patient ID: Michelle [name], female   DOB: 08/22/1960, 57 y.o.   MRN: [id number] Patient ID: Michelle [name], female   DOB: 04-Jun-1961, 57 y.o.   MRN: [id number]

## 2017-10-10 NOTE — Progress Notes (Signed)
[facility] [place] NAME: Michelle [name]    MR#:  [id number]  DATE OF BIRTH:  May 13, 1961  SUBJECTIVE:  CHIEF COMPLAINT:   Chief Complaint  Patient presents with  . Shortness of Breath   -No significant changes.  Remains sedated and intubated.  Low-dose pressors running -Sister at bedside per  REVIEW OF SYSTEMS:  Review of Systems  Unable to perform ROS: Critical illness    DRUG ALLERGIES:   Allergies  Allergen Reactions  . Carvedilol Other (See Comments)    Pt gets "sick" when on medication    VITALS:  Blood pressure 104/66, pulse 65, temperature 98.7 F (37.1 C), temperature source Oral, resp. rate 16, height 4\' 9"  (1.448 m), weight 53 kg (116 lb 13.5 oz), last menstrual period 03/06/2005, SpO2 96 %.  PHYSICAL EXAMINATION:  Physical Exam  GENERAL:  57 y.o.-year-old patient lying in the bed with no acute distress.  EYES: Pupils equal, round, reactive to light and accommodation. No scleral icterus. Extraocular muscles intact.  HEENT: Head atraumatic, normocephalic. Oropharynx and nasopharynx clear.orally intubated and has OG tube  NECK:  Supple, no jugular venous distention. No thyroid enlargement, no tenderness.  LUNGS: Normal breath sounds bilaterally, no wheezing, rales,rhonchi or crepitation. No use of accessory muscles of respiration. Decreased bibasilar breath sounds CARDIOVASCULAR: S1, S2 normal. No murmurs, rubs, or gallops.  ABDOMEN: Soft, nontender, nondistended.  Hypoactive bowel sounds present. No organomegaly or mass.  EXTREMITIES: No cyanosis, or clubbing. 1-2+ edema of hands and feet edema NEUROLOGIC: intubated and sedated.  Not following any commands PSYCHIATRIC: The patient is sedated.  SKIN: No obvious rash, lesion, or ulcer.    LABORATORY PANEL:   CBC Recent Labs  Lab 10/10/17 0439  WBC 14.3*  HGB 8.3*  HCT 25.3*  PLT 223    ------------------------------------------------------------------------------------------------------------------  Chemistries  Recent Labs  Lab 10/07/17 0558 10/08/17 0433  NA 140 143  K 4.6 4.4  CL 102 101  CO2 33* 36*  GLUCOSE 178* 171*  BUN 28* 26*  CREATININE 0.60 0.57  CALCIUM 8.5* 8.9  MG 1.9  --    ------------------------------------------------------------------------------------------------------------------  Cardiac Enzymes No results for input(s): TROPONINI in the last 168 hours. ------------------------------------------------------------------------------------------------------------------  RADIOLOGY:  Dg Chest Port 1 View  Result Date: 10/09/2017 CLINICAL DATA:  Intubated. EXAM: PORTABLE CHEST 1 VIEW COMPARISON:  10/07/2017 FINDINGS: Endotracheal tube tip is 6 cm above the carina. Nasogastric tube enters the abdomen. Right arm PICC tip in the SVC at the azygos level. Emphysematous changes of the lungs as noted previously. No consolidation, collapse or effusion. IMPRESSION: Lines and tubes well positioned. Emphysema. No focal finding otherwise. Electronically Signed   By: Nelson Chimes M.D.   On: 10/09/2017 11:54    EKG:   Orders placed or performed during the hospital encounter of 09/30/17  . EKG 12-Lead  . EKG 12-Lead  . EKG 12-Lead  . EKG 12-Lead    ASSESSMENT AND PLAN:   Michelle [name] a57 y.o.femalewith a known history of anxiety, CHF, cocaine abuse, COPD, coronary artery disease, hepatitis C, hypertension admitted for acute respiratory failure and intubated.  1.  Acute on chronic respiratory failure with hypoxia-secondary to acute on chronic COPD exacerbation -Remains on vent, continue weaning trial as tolerated -Appreciate pulmonary management  -On steroids, nebs.  Finished Zosyn for bronchitis/pneumonia -Vancomycin was added for fevers- but discontinued now- no further fevers- hypothermic last night -Wean sedation and spontaneous breathing  trial again today. - Needs to discuss about  possibility of Trach with family if unable to be weaned off, as been on vent for 10 days  2.  Hypotension-secondary to sedation medications.  Low-dose Levophed.  3.  Anxiety-continue Xanax scheduled.  Also on propofol, fentanyl drips and risperidone  4.  DVT prophylaxis-subcutaneous heparin  Discussed with ICU attending   All the records are reviewed and case discussed with Care Management/Social Workerr. Management plans discussed with the patient, family and they are in agreement.  CODE STATUS: Full Code  TOTAL TIME TAKING CARE OF THIS PATIENT: 26 minutes.   POSSIBLE D/C IN ? DAYS, DEPENDING ON CLINICAL CONDITION.   Gladstone Lighter M.D on 10/10/2017 at 11:15 AM  Between 7am to 6pm - Pager - [phone]  After 6pm go to [email or web address] - password [email or web address] [name]  Office  [phone]  CC: Primary care physician; Center, [facility]

## 2017-10-10 NOTE — Progress Notes (Signed)
Pharmacy Consult:  Pharmacy consulted to assist with constipation management for 57 yo female admitted with COPD exacerbation requiring mechanical ventilation and sedation with fentanyl drip. Patient has not had a documented bowel movement this admission.    Plan: Continue senna/docusate 2 tabs VT BID. Continue erythromycin 400mg  VT Q8hr. If no bowel movement by 5/10 will order bisacodyl suppository.   Pharmacy will continue to monitor and adjust per consult.   Maniyah Moller L 10/10/2017 2:37 PM

## 2017-10-11 ENCOUNTER — Inpatient Hospital Stay: Payer: Medicaid Other

## 2017-10-11 LAB — GLUCOSE, CAPILLARY
GLUCOSE-CAPILLARY: 128 mg/dL — AB (ref 65–99)
GLUCOSE-CAPILLARY: 138 mg/dL — AB (ref 65–99)
GLUCOSE-CAPILLARY: 149 mg/dL — AB (ref 65–99)
GLUCOSE-CAPILLARY: 122 mg/dL — AB (ref 65–99)
Glucose-Capillary: 117 mg/dL — ABNORMAL HIGH (ref 65–99)
Glucose-Capillary: 126 mg/dL — ABNORMAL HIGH (ref 65–99)

## 2017-10-11 MED ORDER — BISACODYL 10 MG RE SUPP
10.0000 mg | Freq: Once | RECTAL | Status: AC
  Administered 2017-10-11: 10 mg via RECTAL
  Filled 2017-10-11: qty 1

## 2017-10-11 MED ORDER — RISPERIDONE 1 MG/ML PO SOLN
2.0000 mg | Freq: Two times a day (BID) | ORAL | Status: DC
  Administered 2017-10-11 – 2017-10-16 (×11): 2 mg
  Filled 2017-10-11 (×15): qty 2

## 2017-10-11 NOTE — Progress Notes (Signed)
Pt failed WUA this morning. Pt became tachycardic, hypertensive and tachypneic. Pt also noted to be restless and agitated. Sedation increased to pre-WUA dose.

## 2017-10-11 NOTE — Progress Notes (Signed)
Follow up - Critical Care Medicine Note  Patient Details:    Michelle [name] is an 57 y.o. female. with a known history of anxiety, CHF, cocaine abuse, COPD, coronary artery disease, hepatitis C, hypertension, intubated on mechanical ventilation.   SUBJECTIVE Remains intubated Sedated Remains critically ill Needs vent to survive Discussed with some family yesterday regarding trach  Supposedly there is a husband that says wants he wants a trach and some family doesn't think that patient would want a trach Consider  palliative care consult     Lines, Airways, Drains: Airway 7.5 mm (Active)  Secured at (cm) 21 cm 10/07/2017  8:00 AM  Measured From Lips 10/07/2017  8:00 AM  Secured Location Left 10/07/2017  8:00 AM  Secured By [employer] 10/07/2017  8:00 AM  Tube Holder Repositioned Yes 10/07/2017  8:00 AM  Cuff Pressure (cm H2O) 25 cm H2O 10/07/2017  8:00 AM  Site Condition Dry 10/07/2017  8:00 AM     PICC Triple Lumen 10/04/17 PICC Right Brachial 36 cm 0 cm (Active)  Indication for Insertion or Continuance of Line Prolonged intravenous therapies 10/07/2017  7:44 AM  Exposed Catheter (cm) 0 cm 10/04/2017  5:00 PM  Site Assessment Clean;Dry;Intact 10/07/2017  7:30 AM  Lumen #1 Status Infusing;Flushed;Blood return noted 10/07/2017  7:30 AM  Lumen #2 Status Infusing;Flushed;Blood return noted 10/07/2017  7:30 AM  Lumen #3 Status Infusing;Flushed;Blood return noted 10/07/2017  7:30 AM  Dressing Type Transparent 10/07/2017  7:30 AM  Dressing Status Intact;Dry;Clean;Antimicrobial disc in place 10/07/2017  7:30 AM  Line Care Connections checked and tightened 10/07/2017  7:30 AM  Line Adjustment (NICU/IV Team Only) No 10/06/2017  2:15 PM  Dressing Intervention Dressing changed;Antimicrobial disc changed 10/07/2017  4:02 AM  Dressing Change Due 10/11/17 10/07/2017  7:30 AM     NG/OG Tube Orogastric 14 Fr. Left mouth Aucultation Documented cm marking at nare/ corner of mouth 55 cm (Active)  Cm Marking at  Nare/Corner of Mouth (if applicable) 55 cm 01/03/4234  7:30 AM  External Length of Tube (cm) - (if applicable) 55 cm 08/07/1441  8:03 PM  Site Assessment Clean;Dry;Intact 10/07/2017  7:30 AM  Ongoing Placement Verification No change in respiratory status;No acute changes, not attributed to clinical condition;Xray;No change in cm markings or external length of tube from initial placement 10/07/2017  7:30 AM  Status Infusing tube feed 10/07/2017  7:30 AM  Intake (mL) 40 mL 10/06/2017  2:00 PM     Urethral Catheter ANNA AND STUDENT CNA Latex 16 Fr. (Active)  Indication for Insertion or Continuance of Catheter Other (comment) 10/07/2017  7:30 AM  Site Assessment Clean;Intact;Dry 10/07/2017  7:30 AM  Catheter Maintenance Bag below level of bladder;Catheter secured;Drainage bag/tubing not touching floor;Insertion date on drainage bag;No dependent loops;Seal intact 10/07/2017  7:44 AM  Collection Container Standard drainage bag 10/07/2017  7:30 AM  Securement Method Securing device (Describe) 10/07/2017  7:30 AM  Urinary Catheter Interventions Unclamped 10/07/2017  7:30 AM  Output (mL) 550 mL 10/07/2017  8:00 AM    Anti-infectives:  Anti-infectives (From admission, onward)   Start     Dose/Rate Route Frequency Ordered Stop   10/09/17 1100  erythromycin ethylsuccinate (EES) 200 MG/5ML suspension 400 mg     400 mg Per Tube Every 8 hours 10/09/17 1028     10/07/17 0900  vancomycin (VANCOCIN) 500 mg in sodium chloride 0.9 % 100 mL IVPB  Status:  Discontinued     500 mg 100 mL/hr over 60 Minutes  Intravenous Every 8 hours 10/07/17 0343 10/07/17 1033   10/06/17 0100  vancomycin (VANCOCIN) 500 mg in sodium chloride 0.9 % 100 mL IVPB  Status:  Discontinued     500 mg 100 mL/hr over 60 Minutes Intravenous Every 12 hours 10/05/17 1540 10/07/17 0343   10/05/17 1145  vancomycin (VANCOCIN) IVPB 1000 mg/200 mL premix     1,000 mg 200 mL/hr over 60 Minutes Intravenous  Once 10/05/17 1132 10/05/17 1401   10/01/17 2200  levofloxacin  (LEVAQUIN) IVPB 750 mg  Status:  Discontinued     750 mg 100 mL/hr over 90 Minutes Intravenous Every 48 hours 10/01/17 2150 10/02/17 1054   10/01/17 1800  levofloxacin (LEVAQUIN) IVPB 500 mg  Status:  Discontinued     500 mg 100 mL/hr over 60 Minutes Intravenous Daily-1800 09/30/17 1853 10/01/17 0804   10/01/17 0900  piperacillin-tazobactam (ZOSYN) IVPB 3.375 g     3.375 g 12.5 mL/hr over 240 Minutes Intravenous Every 8 hours 10/01/17 0804 10/08/17 0545   09/30/17 1700  levofloxacin (LEVAQUIN) IVPB 750 mg     750 mg 100 mL/hr over 90 Minutes Intravenous  Once 09/30/17 1653 10/01/17 0819      Microbiology: Results for orders placed or performed during the hospital encounter of 09/30/17  CULTURE, BLOOD (ROUTINE X 2) w Reflex to ID Panel     Status: None   Collection Time: 09/30/17  5:43 PM  Result Value Ref Range Status   Specimen Description BLOOD CENTRAL LINE  Final   Special Requests   Final    BOTTLES DRAWN AEROBIC AND ANAEROBIC Blood Culture adequate volume   Culture   Final    NO GROWTH 5 DAYS Performed at [facility], [address] [place], [place] [address]    Report Status 10/05/2017 FINAL  Final  MRSA PCR Screening     Status: None   Collection Time: 09/30/17  8:02 PM  Result Value Ref Range Status   MRSA by PCR NEGATIVE NEGATIVE Final    Comment:        The GeneXpert MRSA Assay (FDA approved for NASAL specimens only), is one component of a comprehensive MRSA colonization surveillance program. It is not intended to diagnose MRSA infection nor to guide or monitor treatment for MRSA infections. Performed at [facility], [address], [place], [place] [address]   Urine Culture     Status: None   Collection Time: 09/30/17  8:04 PM  Result Value Ref Range Status   Specimen Description   Final    URINE, RANDOM Performed at [facility], [address], [place], [place] [address]    Special Requests   Final    NONE Performed at  [facility], [address] [place], [place] [address]    Culture   Final    NO GROWTH Performed at [facility], [place] [address] [place], [place] [address]    Report Status 10/02/2017 FINAL  Final  CULTURE, BLOOD (ROUTINE X 2) w Reflex to ID Panel     Status: None   Collection Time: 09/30/17  8:16 PM  Result Value Ref Range Status   Specimen Description BLOOD L HAND  Final   Special Requests   Final    BOTTLES DRAWN AEROBIC AND ANAEROBIC Blood Culture results may not be optimal due to an inadequate volume of blood received in culture bottles   Culture   Final    NO GROWTH 5 DAYS Performed at [facility], [address], [place], [place] [address]    Report Status 10/05/2017 FINAL  Final  Culture, respiratory (NON-Expectorated)     Status: None   Collection Time: 09/30/17  9:07 PM  Result Value Ref Range Status   Specimen Description   Final    TRACHEAL ASPIRATE Performed at [facility], [address], [place], [place] [address]    Special Requests   Final    NONE Performed at [facility], [address] [place], [place] [address]    Gram Stain   Final    MODERATE WBC PRESENT, PREDOMINANTLY PMN FEW GRAM POSITIVE COCCI IN PAIRS IN CLUSTERS FEW GRAM NEGATIVE RODS    Culture   Final    FEW Consistent with normal respiratory flora. Performed at [facility], [place] [address], [place], [place] [address]    Report Status 10/03/2017 FINAL  Final  CULTURE, BLOOD (ROUTINE X 2) w Reflex to ID Panel     Status: None   Collection Time: 10/05/17 11:48 AM  Result Value Ref Range Status   Specimen Description BLOOD LAC  Final   Special Requests   Final    BOTTLES DRAWN AEROBIC AND ANAEROBIC Blood Culture adequate volume   Culture   Final    NO GROWTH 5 DAYS Performed at [facility], [address] [place], [place] [address]    Report Status 10/10/2017 FINAL  Final  CULTURE, BLOOD (ROUTINE X 2) w Reflex to ID Panel      Status: None   Collection Time: 10/05/17 11:55 AM  Result Value Ref Range Status   Specimen Description BLOOD LT HAND  Final   Special Requests   Final    BOTTLES DRAWN AEROBIC AND ANAEROBIC Blood Culture adequate volume   Culture   Final    NO GROWTH 5 DAYS Performed at [facility], [address], [place], [place] [address]    Report Status 10/10/2017 FINAL  Final  Culture, expectorated sputum-assessment     Status: None   Collection Time: 10/05/17  2:18 PM  Result Value Ref Range Status   Specimen Description TRACHEAL ASPIRATE  Final   Special Requests NONE  Final   Sputum evaluation   Final    THIS SPECIMEN IS ACCEPTABLE FOR SPUTUM CULTURE Performed at [facility], [address], [place], [place] [address]    Report Status 10/05/2017 FINAL  Final  Culture, respiratory (NON-Expectorated)     Status: None   Collection Time: 10/05/17  2:18 PM  Result Value Ref Range Status   Specimen Description   Final    TRACHEAL ASPIRATE Performed at [facility], [address] [place], [place] [address]    Special Requests   Final    NONE Reflexed from [id number] Performed at [facility], [address] [place], [place] [address]    Gram Stain   Final    MODERATE WBC PRESENT,BOTH PMN AND MONONUCLEAR NO SQUAMOUS EPITHELIAL CELLS SEEN FEW YEAST WITH PSEUDOHYPHAE RARE GRAM POSITIVE COCCI IN CLUSTERS Performed at [facility], [place] [address], [place], [place] [address]    Culture FEW CANDIDA ALBICANS FEW CANDIDA DUBLINIENSIS   Final   Report Status 10/08/2017 FINAL  Final    Studies: Dg Abd 1 View  Result Date: 10/06/2017 CLINICAL DATA:  Initial evaluation for ileus. EXAM: ABDOMEN - 1 VIEW COMPARISON:  Prior radiograph from 10/04/2017. FINDINGS: Enteric tube in place with tip overlying the gastric body, side hole  at or just beyond the GE junction. Few mildly prominent gas-filled loops of bowel overlie the mid and left abdomen without  overt evidence for obstruction. No abnormal bowel wall thickening. No soft tissue mass or abnormal calcification. Tubal ligation clips overlie the pelvis. Visualized lung bases are clear. IMPRESSION: 1. Few mildly prominent gas-filled loops of bowel overlying the mid and left abdomen without evidence for obstruction. 2. Enteric tube in place with tip overlying the gastric body, side hole at or just beyond the level of the GE junction. Electronically Signed   By: Jeannine Boga M.D.   On: 10/06/2017 13:13   Dg Abd 1 View  Result Date: 10/04/2017 CLINICAL DATA:  History of pneumonia, history of acute renal injury, smoking history EXAM: ABDOMEN - 1 VIEW COMPARISON:  Portable chest x-ray of 10/03/2017 FINDINGS: A supine view of the abdomen shows an NG tube extending into the proximal body of the stomach. The bowel gas pattern is nonspecific. No bowel obstruction is seen. A vascular sheath overlies the right pelvis. No bony abnormality is noted. IMPRESSION: 1. NG tube extends to a point overlying the proximal body of the stomach. 2. No bowel obstruction. Electronically Signed   By: Ivar Drape M.D.   On: 10/04/2017 09:54   Dg Chest Port 1 View  Result Date: 10/09/2017 CLINICAL DATA:  Intubated. EXAM: PORTABLE CHEST 1 VIEW COMPARISON:  10/07/2017 FINDINGS: Endotracheal tube tip is 6 cm above the carina. Nasogastric tube enters the abdomen. Right arm PICC tip in the SVC at the azygos level. Emphysematous changes of the lungs as noted previously. No consolidation, collapse or effusion. IMPRESSION: Lines and tubes well positioned. Emphysema. No focal finding otherwise. Electronically Signed   By: Nelson Chimes M.D.   On: 10/09/2017 11:54   Dg Chest Port 1 View  Result Date: 10/07/2017 CLINICAL DATA:  Atelectasis. EXAM: PORTABLE CHEST 1 VIEW COMPARISON:  Oct 06, 2017 FINDINGS: The ETT is in good position. The NG tube terminates below today's film. A right PICC line terminates in the central SVC, stable. No  pneumothorax. The cardiomediastinal silhouette is normal. Haziness in the medial right lung base may represent vascular crowding/overlying soft tissue given positioning. Infiltrate considered less likely. No other interval changes. IMPRESSION: 1. Support apparatus as above. 2. There is haziness over the medial right lung base. I suspect this is either vascular crowding or overlying soft tissue attenuation given positioning. Subtle infiltrate considered less likely. Recommend attention on follow-up. Electronically Signed   By: Dorise Bullion III M.D   On: 10/07/2017 09:01   Dg Chest Port 1 View  Result Date: 10/06/2017 CLINICAL DATA:  Pneumonia EXAM: PORTABLE CHEST 1 VIEW COMPARISON:  10/04/2017 chest radiograph. FINDINGS: Endotracheal tube tip is 6.3 cm above the carina. Enteric tube enters stomach with the tip not seen on this image. Right PICC terminates in the lower third of the SVC. Stable cardiomediastinal silhouette with normal heart size. No pneumothorax. No pleural effusion. Hyperinflated lungs. No pulmonary edema. Mild left basilar scarring versus atelectasis. No acute consolidative airspace disease. IMPRESSION: 1. Well-positioned support structures as detailed. 2. Hyperinflated lungs, suggesting COPD. 3. Stable mild left basilar scarring versus atelectasis. Electronically Signed   By: Ilona Sorrel M.D.   On: 10/06/2017 07:15   Dg Chest Port 1 View  Result Date: 10/04/2017 CLINICAL DATA:  Pneumonia EXAM: PORTABLE CHEST 1 VIEW COMPARISON:  10/03/2017 FINDINGS: Endotracheal tube and NG tube are unchanged. Hyperinflation of the lungs. Heart is normal size. Minimal left base atelectasis. No effusions.  Right lung clear. No acute bony abnormality. IMPRESSION: Hyperinflation/COPD.  Left base atelectasis. Electronically Signed   By: Rolm Baptise M.D.   On: 10/04/2017 09:51   Dg Chest Port 1 View  Result Date: 10/03/2017 CLINICAL DATA:  Atelectasis EXAM: PORTABLE CHEST 1 VIEW COMPARISON:  10/02/2017  FINDINGS: Endotracheal tube in good position.  NG tube in the stomach. COPD with hyperinflation. Negative for infiltrate effusion or edema. IMPRESSION: Endotracheal tube in good position. COPD.  The lungs remain clear. Electronically Signed   By: Franchot Gallo M.D.   On: 10/03/2017 09:16   Dg Chest Port 1 View  Result Date: 10/02/2017 CLINICAL DATA:  Hypoxia EXAM: PORTABLE CHEST 1 VIEW COMPARISON:  September 30, 2017 FINDINGS: Endotracheal tube tip is 4.5 cm above the carina. Nasogastric tube tip and side port are below the diaphragm. No pneumothorax. Lungs are hyperexpanded. There is no edema or consolidation. The heart size is normal. Pulmonary vascularity is within normal limits. There is aortic atherosclerosis. No adenopathy. No bone lesions. IMPRESSION: Tube positions as described without pneumothorax. Lungs hyperexpanded without edema or consolidation. Stable cardiac silhouette. There is aortic atherosclerosis. Aortic Atherosclerosis (ICD10-I70.0). Electronically Signed   By: Lowella Grip III M.D.   On: 10/02/2017 07:31   Dg Chest Portable 1 View  Result Date: 09/30/2017 CLINICAL DATA:  Orogastric tube placement EXAM: PORTABLE CHEST 1 VIEW COMPARISON:  Earlier today FINDINGS: An orogastric tube tip reaches the stomach. The side port is near the GE junction. Endotracheal tube tip between the clavicular heads and carina. Large lung volumes with lucent appearance. There is no edema, consolidation, effusion, or pneumothorax. Normal heart size and mediastinal contours. Artifact from EKG leads. IMPRESSION: The new orogastric tube is in good position. Otherwise stable from earlier today. Electronically Signed   By: Monte Fantasia M.D.   On: 09/30/2017 18:48   Dg Chest Port 1 View  Result Date: 09/30/2017 CLINICAL DATA:  Status post intubation. EXAM: PORTABLE CHEST 1 VIEW COMPARISON:  September 30, 2017 FINDINGS: The ETT is in good position. No pneumothorax. Tiny effusions with atelectasis. No other changes.  Emphysema. IMPRESSION: 1. The ETT is in good position.  No other changes. Electronically Signed   By: Dorise Bullion III M.D   On: 09/30/2017 17:28   Dg Chest Port 1 View  Result Date: 09/30/2017 CLINICAL DATA:  Shortness of breath. EXAM: PORTABLE CHEST 1 VIEW COMPARISON:  CT scan August 23, 2017.  Chest x-ray August 20, 2017. FINDINGS: Hyperexpansion of the lungs consistent with emphysema. Tiny pleural effusions and underlying atelectasis. No other interval changes. IMPRESSION: Emphysema.  Tiny pleural effusions and underlying atelectasis. Electronically Signed   By: Dorise Bullion III M.D   On: 09/30/2017 17:27   [place] Ekg Site Rite  Result Date: 10/04/2017 If Site Rite image not attached, placement could not be confirmed due to current cardiac rhythm.   Consults: Treatment Team:  Pccm, Ander Gaster, MD   Subjective:    Overnight Issues:patient failed SBT/SAT yesterday. Discussion with family regarding possible tracheostomy  Objective:  Vital signs for last 24 hours: Temp:  [97.2 F (36.2 C)-98.8 F (37.1 C)] 98.8 F (37.1 C) (05/10 0000) Pulse Rate:  [57-104] 68 (05/10 0600) Resp:  [0-16] 16 (05/10 0600) BP: (85-131)/(61-81) 94/61 (05/10 0600) SpO2:  [93 %-100 %] 99 % (05/10 0600) FiO2 (%):  [30 %-50 %] 50 % (05/10 0406) Weight:  [118 lb 2.7 oz (53.6 kg)] 118 lb 2.7 oz (53.6 kg) (05/10 0354)  Hemodynamic parameters for last 24 hours:  Intake/Output from previous day: 05/09 0701 - 05/10 0700 In: 1637.2 [I.V.:1017.2; NG/GT:620] Out: 42 [Urine:765; Emesis/NG output:850]  Intake/Output this shift: Total I/O In: 890 [I.V.:490; NG/GT:400] Out: 765 [Urine:765]  Vent settings for last 24 hours: Vent Mode: PRVC FiO2 (%):  [30 %-50 %] 50 % Set Rate:  [16 bmp] 16 bmp Vt Set:  [450 mL] 450 mL PEEP:  [5 cmH20] 5 cmH20 Plateau Pressure:  [22 cmH20] 22 cmH20 Physical Exam:  Presently patient is resting comfortably on mechanical ventilation. She is sedated and  unresponsive Vital signs: Please see the above listed vital signs HEENT: Trachea is midline, oral endotracheal tube in place, no accessory muscle utilization, no jugular venous distention is noted Cardiovascular: Regular rate and rhythm, sinus mechanism noted on monitor Pulmonary: Prolonged expiratory phase, diffuse expiratory rhonchi and wheezes noted Abdominal: Hypoactive bowel sounds, generally soft exam Tremors: No clubbing cyanosis or edema noted Neurologic: Patient is sedated limited exam   Assessment/Plan:   57 yo female with end stage COPD with severe resp failure from COPD exacerbation with failure to wean from vent with prolonged vent support from severe wheezing   1.Severe respiratory failure with acute bronchospasm. Patient is presently on Solu-Medrol, albuterol, Atrovent, completed antibiotics,sedation has been adjusted to include propofol and fentanyl. Will attempt spontaneous awakening and breathing trial today. If unsuccessful will discuss with family regarding tracheostomy and PEG tube placement versus comfort  Anemia:No evidence of active bleeding  Leukocytosis. patient afebrile, no change in chest x-ray or evidence of active bleeding  Hyperglycemia. On sliding scale   Critical Care Time devoted to patient care services described in this note is 32 minutes.   Overall, patient is critically ill, prognosis is guarded.   high risk for cardiac arrest and death.    Corrin Parker, M.D.  [facility]  Medical Director [place] Director [facility] Cardio-Pulmonary Department

## 2017-10-11 NOTE — Progress Notes (Signed)
Pharmacy Consult:  Pharmacy consulted to assist with constipation management for 57 yo female admitted with COPD exacerbation requiring mechanical ventilation and sedation with fentanyl and midazolam drips. Patient has not had a documented bowel movement this admission.    Plan: Continue senna/docusate 2 tabs VT BID. Continue erythromycin 400mg  VT Q8hr. Will order bisacodyl suppository x 1 on 5/10. Will reorder daily until patient has bowel movement.    Pharmacy will continue to monitor and adjust per consult.   Simpson,Michael L 10/11/2017 9:11 PM

## 2017-10-11 NOTE — Progress Notes (Signed)
[place] [place] NAME: Michelle [name]    MR#:  [id number]  DATE OF BIRTH:  [birth year]  SUBJECTIVE:  CHIEF COMPLAINT:   Chief Complaint  Patient presents with  . Shortness of Breath   - slightly more alert and following simple commands this AM - plans for SBT and weaning trial today  REVIEW OF SYSTEMS:  Review of Systems  Unable to perform ROS: Critical illness    DRUG ALLERGIES:   Allergies  Allergen Reactions  . Carvedilol Other (See Comments)    Pt gets "sick" when on medication    VITALS:  Blood pressure 121/71, pulse 97, temperature 98.4 F (36.9 C), temperature source Bladder, resp. rate (!) 0, height 4\' 9"  (1.448 m), weight 53.6 kg (118 lb 2.7 oz), last menstrual period 03/06/2005, SpO2 94 %.  PHYSICAL EXAMINATION:  Physical Exam  GENERAL:  57 y.o.-year-old patient lying in the bed with no acute distress.  EYES: Pupils equal, round, reactive to light and accommodation. No scleral icterus. Extraocular muscles intact.  HEENT: Head atraumatic, normocephalic. Oropharynx and nasopharynx clear.orally intubated and has OG tube  NECK:  Supple, no jugular venous distention. No thyroid enlargement, no tenderness.  LUNGS: Normal breath sounds bilaterally, no wheezing, rales,rhonchi or crepitation. No use of accessory muscles of respiration. Decreased bibasilar breath sounds CARDIOVASCULAR: S1, S2 normal. No murmurs, rubs, or gallops.  ABDOMEN: Soft, nontender, nondistended.  Hypoactive bowel sounds present. No organomegaly or mass.  EXTREMITIES: No cyanosis, or clubbing. 1-2+ edema of hands and feet edema NEUROLOGIC: intubated and sedated.  Today following simple commands PSYCHIATRIC: The patient is sedated.  SKIN: No obvious rash, lesion, or ulcer.    LABORATORY PANEL:   CBC Recent Labs  Lab 10/10/17 0439  WBC 14.3*  HGB 8.3*  HCT 25.3*  PLT 223    ------------------------------------------------------------------------------------------------------------------  Chemistries  Recent Labs  Lab 10/07/17 0558 10/08/17 0433  NA 140 143  K 4.6 4.4  CL 102 101  CO2 33* 36*  GLUCOSE 178* 171*  BUN 28* 26*  CREATININE 0.60 0.57  CALCIUM 8.5* 8.9  MG 1.9  --    ------------------------------------------------------------------------------------------------------------------  Cardiac Enzymes No results for input(s): TROPONINI in the last 168 hours. ------------------------------------------------------------------------------------------------------------------  RADIOLOGY:  No results found.  EKG:   Orders placed or performed during the hospital encounter of 09/30/17  . EKG 12-Lead  . EKG 12-Lead  . EKG 12-Lead  . EKG 12-Lead    ASSESSMENT AND PLAN:   [name] a57 y.o.femalewith a known history of anxiety, CHF, cocaine abuse, COPD, coronary artery disease, hepatitis C, hypertension admitted for acute respiratory failure and intubated.  1.  Acute on chronic respiratory failure with hypoxia-secondary to acute on chronic COPD exacerbation -Remains on vent, continue weaning trial as tolerated -Appreciate pulmonary management  -On steroids, nebs.  Finished Zosyn for bronchitis/pneumonia -Vancomycin was added for fevers- but discontinued now- no further fevers- hypothermic last night -Wean sedation and spontaneous breathing trial again today.  - Needs to discuss about possibility of Trach with family if unable to be weaned off, as been on vent for 10 days  2.  Hypotension-secondary to sedation medications.  Low-dose Levophed.  3.  Anxiety-continue Xanax scheduled.  Also on propofol, fentanyl drips and risperidone  4.  DVT prophylaxis-subcutaneous heparin  Discussed with ICU attending   All the records are reviewed and case discussed with Care Management/Social Workerr. Management plans discussed with the  patient, family and they are in agreement.  CODE STATUS: Full Code  TOTAL TIME TAKING CARE OF THIS PATIENT: 26 minutes.   POSSIBLE D/C IN ? DAYS, DEPENDING ON CLINICAL CONDITION.   Gladstone Lighter M.D on 10/11/2017 at 1:04 PM  Between 7am to 6pm - Pager - [phone]  After 6pm go to [email or web address] - password [email or web address] [facility]  Office  [phone]  CC: Primary care physician; Center, [facility]

## 2017-10-12 ENCOUNTER — Inpatient Hospital Stay: Payer: Medicaid Other

## 2017-10-12 LAB — COMPREHENSIVE METABOLIC PANEL
ALT: 47 U/L (ref 14–54)
ANION GAP: 4 — AB (ref 5–15)
AST: 26 U/L (ref 15–41)
Albumin: 2.6 g/dL — ABNORMAL LOW (ref 3.5–5.0)
Alkaline Phosphatase: 34 U/L — ABNORMAL LOW (ref 38–126)
BUN: 35 mg/dL — ABNORMAL HIGH (ref 6–20)
CHLORIDE: 100 mmol/L — AB (ref 101–111)
CO2: 34 mmol/L — ABNORMAL HIGH (ref 22–32)
CREATININE: 0.39 mg/dL — AB (ref 0.44–1.00)
Calcium: 8.9 mg/dL (ref 8.9–10.3)
Glucose, Bld: 132 mg/dL — ABNORMAL HIGH (ref 65–99)
POTASSIUM: 4.2 mmol/L (ref 3.5–5.1)
Sodium: 138 mmol/L (ref 135–145)
Total Bilirubin: 0.4 mg/dL (ref 0.3–1.2)
Total Protein: 5.6 g/dL — ABNORMAL LOW (ref 6.5–8.1)

## 2017-10-12 LAB — GLUCOSE, CAPILLARY
GLUCOSE-CAPILLARY: 113 mg/dL — AB (ref 65–99)
GLUCOSE-CAPILLARY: 127 mg/dL — AB (ref 65–99)
GLUCOSE-CAPILLARY: 130 mg/dL — AB (ref 65–99)
GLUCOSE-CAPILLARY: 137 mg/dL — AB (ref 65–99)
Glucose-Capillary: 99 mg/dL (ref 65–99)
Glucose-Capillary: 132 mg/dL — ABNORMAL HIGH (ref 65–99)

## 2017-10-12 LAB — CBC
HCT: 29.6 % — ABNORMAL LOW (ref 35.0–47.0)
Hemoglobin: 9.5 g/dL — ABNORMAL LOW (ref 12.0–16.0)
MCH: 29.1 pg (ref 26.0–34.0)
MCHC: 32.2 g/dL (ref 32.0–36.0)
MCV: 90.4 fL (ref 80.0–100.0)
PLATELETS: 221 10*3/uL (ref 150–440)
RBC: 3.28 MIL/uL — AB (ref 3.80–5.20)
RDW: 15 % — ABNORMAL HIGH (ref 11.5–14.5)
WBC: 10.6 10*3/uL (ref 3.6–11.0)

## 2017-10-12 LAB — PHOSPHORUS: Phosphorus: 3.3 mg/dL (ref 2.5–4.6)

## 2017-10-12 LAB — MAGNESIUM: Magnesium: 2 mg/dL (ref 1.7–2.4)

## 2017-10-12 MED ORDER — BISACODYL 10 MG RE SUPP
10.0000 mg | Freq: Once | RECTAL | Status: AC
  Administered 2017-10-12: 10 mg via RECTAL
  Filled 2017-10-12: qty 1

## 2017-10-12 NOTE — Plan of Care (Signed)
  Problem: Clinical Measurements: Goal: Ability to maintain clinical measurements within normal limits will improve Outcome: Progressing Goal: Will remain free from infection Outcome: Progressing Goal: Diagnostic test results will improve Outcome: Progressing Goal: Cardiovascular complication will be avoided Outcome: Progressing   Problem: Activity: Goal: Risk for activity intolerance will decrease Outcome: Progressing   Problem: Nutrition: Goal: Adequate nutrition will be maintained Outcome: Progressing   Problem: Coping: Goal: Level of anxiety will decrease Outcome: Progressing   Problem: Elimination: Goal: Will not experience complications related to bowel motility Outcome: Progressing Goal: Will not experience complications related to urinary retention Outcome: Progressing   Problem: Pain Managment: Goal: General experience of comfort will improve Outcome: Progressing   Problem: Safety: Goal: Ability to remain free from injury will improve Outcome: Progressing   Problem: Skin Integrity: Goal: Risk for impaired skin integrity will decrease Outcome: Progressing   

## 2017-10-12 NOTE — Progress Notes (Addendum)
Follow up - Critical Care Medicine Note  Patient Details:    Michelle [name] is an 57 y.o. female.with a known history of anxiety, CHF, cocaine abuse, COPD, coronary artery disease, hepatitis C, hypertension, intubated on mechanical ventilation.   Lines, Airways, Drains: Airway 7.5 mm (Active)  Secured at (cm) 21 cm 10/07/2017  8:00 AM  Measured From Lips 10/07/2017  8:00 AM  Secured Location Left 10/07/2017  8:00 AM  Secured By [employer] 10/07/2017  8:00 AM  Tube Holder Repositioned Yes 10/07/2017  8:00 AM  Cuff Pressure (cm H2O) 25 cm H2O 10/07/2017  8:00 AM  Site Condition Dry 10/07/2017  8:00 AM     PICC Triple Lumen 10/04/17 PICC Right Brachial 36 cm 0 cm (Active)  Indication for Insertion or Continuance of Line Prolonged intravenous therapies 10/07/2017  7:44 AM  Exposed Catheter (cm) 0 cm 10/04/2017  5:00 PM  Site Assessment Clean;Dry;Intact 10/07/2017  7:30 AM  Lumen #1 Status Infusing;Flushed;Blood return noted 10/07/2017  7:30 AM  Lumen #2 Status Infusing;Flushed;Blood return noted 10/07/2017  7:30 AM  Lumen #3 Status Infusing;Flushed;Blood return noted 10/07/2017  7:30 AM  Dressing Type Transparent 10/07/2017  7:30 AM  Dressing Status Intact;Dry;Clean;Antimicrobial disc in place 10/07/2017  7:30 AM  Line Care Connections checked and tightened 10/07/2017  7:30 AM  Line Adjustment (NICU/IV Team Only) No 10/06/2017  2:15 PM  Dressing Intervention Dressing changed;Antimicrobial disc changed 10/07/2017  4:02 AM  Dressing Change Due 10/11/17 10/07/2017  7:30 AM     NG/OG Tube Orogastric 14 Fr. Left mouth Aucultation Documented cm marking at nare/ corner of mouth 55 cm (Active)  Cm Marking at Nare/Corner of Mouth (if applicable) 55 cm 11/08/8936  7:30 AM  External Length of Tube (cm) - (if applicable) 55 cm 1/0/1751  8:03 PM  Site Assessment Clean;Dry;Intact 10/07/2017  7:30 AM  Ongoing Placement Verification No change in respiratory status;No acute changes, not attributed to clinical condition;Xray;No  change in cm markings or external length of tube from initial placement 10/07/2017  7:30 AM  Status Infusing tube feed 10/07/2017  7:30 AM  Intake (mL) 40 mL 10/06/2017  2:00 PM     Urethral Catheter ANNA AND STUDENT CNA Latex 16 Fr. (Active)  Indication for Insertion or Continuance of Catheter Other (comment) 10/07/2017  7:30 AM  Site Assessment Clean;Intact;Dry 10/07/2017  7:30 AM  Catheter Maintenance Bag below level of bladder;Catheter secured;Drainage bag/tubing not touching floor;Insertion date on drainage bag;No dependent loops;Seal intact 10/07/2017  7:44 AM  Collection Container Standard drainage bag 10/07/2017  7:30 AM  Securement Method Securing device (Describe) 10/07/2017  7:30 AM  Urinary Catheter Interventions Unclamped 10/07/2017  7:30 AM  Output (mL) 550 mL 10/07/2017  8:00 AM    Anti-infectives:  Anti-infectives (From admission, onward)   Start     Dose/Rate Route Frequency Ordered Stop   10/09/17 1100  erythromycin ethylsuccinate (EES) 200 MG/5ML suspension 400 mg     400 mg Per Tube Every 8 hours 10/09/17 1028     10/07/17 0900  vancomycin (VANCOCIN) 500 mg in sodium chloride 0.9 % 100 mL IVPB  Status:  Discontinued     500 mg 100 mL/hr over 60 Minutes Intravenous Every 8 hours 10/07/17 0343 10/07/17 1033   10/06/17 0100  vancomycin (VANCOCIN) 500 mg in sodium chloride 0.9 % 100 mL IVPB  Status:  Discontinued     500 mg 100 mL/hr over 60 Minutes Intravenous Every 12 hours 10/05/17 1540 10/07/17 0343   10/05/17 1145  vancomycin (VANCOCIN) IVPB 1000 mg/200 mL premix     1,000 mg 200 mL/hr over 60 Minutes Intravenous  Once 10/05/17 1132 10/05/17 1401   10/01/17 2200  levofloxacin (LEVAQUIN) IVPB 750 mg  Status:  Discontinued     750 mg 100 mL/hr over 90 Minutes Intravenous Every 48 hours 10/01/17 2150 10/02/17 1054   10/01/17 1800  levofloxacin (LEVAQUIN) IVPB 500 mg  Status:  Discontinued     500 mg 100 mL/hr over 60 Minutes Intravenous Daily-1800 09/30/17 1853 10/01/17 0804    10/01/17 0900  piperacillin-tazobactam (ZOSYN) IVPB 3.375 g     3.375 g 12.5 mL/hr over 240 Minutes Intravenous Every 8 hours 10/01/17 0804 10/08/17 0545   09/30/17 1700  levofloxacin (LEVAQUIN) IVPB 750 mg     750 mg 100 mL/hr over 90 Minutes Intravenous  Once 09/30/17 1653 10/01/17 0819      Microbiology: Results for orders placed or performed during the hospital encounter of 09/30/17  CULTURE, BLOOD (ROUTINE X 2) w Reflex to ID Panel     Status: None   Collection Time: 09/30/17  5:43 PM  Result Value Ref Range Status   Specimen Description BLOOD CENTRAL LINE  Final   Special Requests   Final    BOTTLES DRAWN AEROBIC AND ANAEROBIC Blood Culture adequate volume   Culture   Final    NO GROWTH 5 DAYS Performed at [facility], [address] [place], [place] [address]    Report Status 10/05/2017 FINAL  Final  MRSA PCR Screening     Status: None   Collection Time: 09/30/17  8:02 PM  Result Value Ref Range Status   MRSA by PCR NEGATIVE NEGATIVE Final    Comment:        The GeneXpert MRSA Assay (FDA approved for NASAL specimens only), is one component of a comprehensive MRSA colonization surveillance program. It is not intended to diagnose MRSA infection nor to guide or monitor treatment for MRSA infections. Performed at [facility], [address] [place], [place] [address]   Urine Culture     Status: None   Collection Time: 09/30/17  8:04 PM  Result Value Ref Range Status   Specimen Description   Final    URINE, RANDOM Performed at [facility], [address] [place], [place] [address]    Special Requests   Final    NONE Performed at [facility], [address] [place], [place] [address]    Culture   Final    NO GROWTH Performed at [facility], [place] [address] [place], [place] [address]    Report Status 10/02/2017 FINAL  Final  CULTURE, BLOOD (ROUTINE X 2) w Reflex to ID Panel     Status: None   Collection  Time: 09/30/17  8:16 PM  Result Value Ref Range Status   Specimen Description BLOOD L HAND  Final   Special Requests   Final    BOTTLES DRAWN AEROBIC AND ANAEROBIC Blood Culture results may not be optimal due to an inadequate volume of blood received in culture bottles   Culture   Final    NO GROWTH 5 DAYS Performed at [facility], [address] [place], [place] [address]    Report Status 10/05/2017 FINAL  Final  Culture, respiratory (NON-Expectorated)     Status: None   Collection Time: 09/30/17  9:07 PM  Result Value Ref Range Status   Specimen Description   Final    TRACHEAL ASPIRATE  Performed at [facility], [address] [place], [place] [address]    Special Requests   Final    NONE Performed at [facility], [place], [place] [address]    Gram Stain   Final    MODERATE WBC PRESENT, PREDOMINANTLY PMN FEW GRAM POSITIVE COCCI IN PAIRS IN CLUSTERS FEW GRAM NEGATIVE RODS    Culture   Final    FEW Consistent with normal respiratory flora. Performed at [facility], [place] [address] [place], [place] [address]    Report Status 10/03/2017 FINAL  Final  CULTURE, BLOOD (ROUTINE X 2) w Reflex to ID Panel     Status: None   Collection Time: 10/05/17 11:48 AM  Result Value Ref Range Status   Specimen Description BLOOD LAC  Final   Special Requests   Final    BOTTLES DRAWN AEROBIC AND ANAEROBIC Blood Culture adequate volume   Culture   Final    NO GROWTH 5 DAYS Performed at [facility], [address] [place], [place] [address]    Report Status 10/10/2017 FINAL  Final  CULTURE, BLOOD (ROUTINE X 2) w Reflex to ID Panel     Status: None   Collection Time: 10/05/17 11:55 AM  Result Value Ref Range Status   Specimen Description BLOOD LT HAND  Final   Special Requests   Final    BOTTLES DRAWN AEROBIC AND ANAEROBIC Blood Culture adequate volume   Culture   Final    NO GROWTH 5 DAYS Performed at [facility], [address], [place], [place] [address]    Report Status 10/10/2017 FINAL  Final  Culture, expectorated sputum-assessment     Status: None   Collection Time: 10/05/17  2:18 PM  Result Value Ref Range Status   Specimen Description TRACHEAL ASPIRATE  Final   Special Requests NONE  Final   Sputum evaluation   Final    THIS SPECIMEN IS ACCEPTABLE FOR SPUTUM CULTURE Performed at [facility], [address], [place], [place] [address]    Report Status 10/05/2017 FINAL  Final  Culture, respiratory (NON-Expectorated)     Status: None   Collection Time: 10/05/17  2:18 PM  Result Value Ref Range Status   Specimen Description   Final    TRACHEAL ASPIRATE Performed at [facility], [address] [place], [place] [address]    Special Requests   Final    NONE Reflexed from [id number] Performed at [facility], [address] [place], [place] [address]    Gram Stain   Final    MODERATE WBC PRESENT,BOTH PMN AND MONONUCLEAR NO SQUAMOUS EPITHELIAL CELLS SEEN FEW YEAST WITH PSEUDOHYPHAE RARE GRAM POSITIVE COCCI IN CLUSTERS Performed at [facility], [place] [address] [place],  [address]    Culture FEW CANDIDA ALBICANS FEW CANDIDA DUBLINIENSIS   Final   Report Status 10/08/2017 FINAL  Final    Studies: Dg Abd 1 View  Result Date: 10/06/2017 CLINICAL DATA:  Initial evaluation for ileus. EXAM: ABDOMEN - 1 VIEW COMPARISON:  Prior radiograph from 10/04/2017. FINDINGS: Enteric tube in place with tip overlying the gastric body, side hole at or just beyond the GE junction. Few mildly prominent gas-filled loops of bowel overlie the mid and left abdomen without overt evidence for obstruction. No abnormal bowel wall thickening. No soft tissue mass or abnormal calcification. Tubal ligation clips overlie the pelvis. Visualized lung bases are clear. IMPRESSION: 1. Few mildly prominent  gas-filled loops of bowel overlying the mid and left abdomen without evidence  for obstruction. 2. Enteric tube in place with tip overlying the gastric body, side hole at or just beyond the level of the GE junction. Electronically Signed   By: Jeannine Boga M.D.   On: 10/06/2017 13:13   Dg Abd 1 View  Result Date: 10/04/2017 CLINICAL DATA:  History of pneumonia, history of acute renal injury, smoking history EXAM: ABDOMEN - 1 VIEW COMPARISON:  Portable chest x-ray of 10/03/2017 FINDINGS: A supine view of the abdomen shows an NG tube extending into the proximal body of the stomach. The bowel gas pattern is nonspecific. No bowel obstruction is seen. A vascular sheath overlies the right pelvis. No bony abnormality is noted. IMPRESSION: 1. NG tube extends to a point overlying the proximal body of the stomach. 2. No bowel obstruction. Electronically Signed   By: Ivar Drape M.D.   On: 10/04/2017 09:54   Dg Chest Port 1 View  Result Date: 10/11/2017 CLINICAL DATA:  Intubated.  COPD.  Smoker. EXAM: PORTABLE CHEST 1 VIEW COMPARISON:  10/09/2017. FINDINGS: Endotracheal tube tip at the level of the clavicles. Nasogastric tube extending into the stomach. Right PICC tip in the proximal superior vena cava. Normal sized heart. Hyperexpanded lungs. Small amount of linear scarring in the left mid lung zone. Otherwise, the lungs are clear. Positional scoliosis and with patient rotation to the right. IMPRESSION: 1. No acute abnormality. 2. Stable changes of COPD. 3. Right PICC tip in the superior vena cava. This could be advanced 7 cm to place it at the superior cavoatrial junction. Electronically Signed   By: Claudie Revering M.D.   On: 10/11/2017 13:18   Dg Chest Port 1 View  Result Date: 10/09/2017 CLINICAL DATA:  Intubated. EXAM: PORTABLE CHEST 1 VIEW COMPARISON:  10/07/2017 FINDINGS: Endotracheal tube tip is 6 cm above the carina. Nasogastric tube enters the abdomen. Right arm PICC tip in the SVC at the azygos level. Emphysematous changes of the lungs as noted previously. No consolidation,  collapse or effusion. IMPRESSION: Lines and tubes well positioned. Emphysema. No focal finding otherwise. Electronically Signed   By: Nelson Chimes M.D.   On: 10/09/2017 11:54   Dg Chest Port 1 View  Result Date: 10/07/2017 CLINICAL DATA:  Atelectasis. EXAM: PORTABLE CHEST 1 VIEW COMPARISON:  Oct 06, 2017 FINDINGS: The ETT is in good position. The NG tube terminates below today's film. A right PICC line terminates in the central SVC, stable. No pneumothorax. The cardiomediastinal silhouette is normal. Haziness in the medial right lung base may represent vascular crowding/overlying soft tissue given positioning. Infiltrate considered less likely. No other interval changes. IMPRESSION: 1. Support apparatus as above. 2. There is haziness over the medial right lung base. I suspect this is either vascular crowding or overlying soft tissue attenuation given positioning. Subtle infiltrate considered less likely. Recommend attention on follow-up. Electronically Signed   By: Dorise Bullion III M.D   On: 10/07/2017 09:01   Dg Chest Port 1 View  Result Date: 10/06/2017 CLINICAL DATA:  Pneumonia EXAM: PORTABLE CHEST 1 VIEW COMPARISON:  10/04/2017 chest radiograph. FINDINGS: Endotracheal tube tip is 6.3 cm above the carina. Enteric tube enters stomach with the tip not seen on this image. Right PICC terminates in the lower third of the SVC. Stable cardiomediastinal silhouette with normal heart size. No pneumothorax. No pleural effusion. Hyperinflated lungs. No pulmonary edema. Mild left basilar scarring versus atelectasis. No acute consolidative airspace disease. IMPRESSION: 1. Well-positioned  support structures as detailed. 2. Hyperinflated lungs, suggesting COPD. 3. Stable mild left basilar scarring versus atelectasis. Electronically Signed   By: Ilona Sorrel M.D.   On: 10/06/2017 07:15   Dg Chest Port 1 View  Result Date: 10/04/2017 CLINICAL DATA:  Pneumonia EXAM: PORTABLE CHEST 1 VIEW COMPARISON:  10/03/2017 FINDINGS:  Endotracheal tube and NG tube are unchanged. Hyperinflation of the lungs. Heart is normal size. Minimal left base atelectasis. No effusions. Right lung clear. No acute bony abnormality. IMPRESSION: Hyperinflation/COPD.  Left base atelectasis. Electronically Signed   By: Rolm Baptise M.D.   On: 10/04/2017 09:51   Dg Chest Port 1 View  Result Date: 10/03/2017 CLINICAL DATA:  Atelectasis EXAM: PORTABLE CHEST 1 VIEW COMPARISON:  10/02/2017 FINDINGS: Endotracheal tube in good position.  NG tube in the stomach. COPD with hyperinflation. Negative for infiltrate effusion or edema. IMPRESSION: Endotracheal tube in good position. COPD.  The lungs remain clear. Electronically Signed   By: Franchot Gallo M.D.   On: 10/03/2017 09:16   Dg Chest Port 1 View  Result Date: 10/02/2017 CLINICAL DATA:  Hypoxia EXAM: PORTABLE CHEST 1 VIEW COMPARISON:  September 30, 2017 FINDINGS: Endotracheal tube tip is 4.5 cm above the carina. Nasogastric tube tip and side port are below the diaphragm. No pneumothorax. Lungs are hyperexpanded. There is no edema or consolidation. The heart size is normal. Pulmonary vascularity is within normal limits. There is aortic atherosclerosis. No adenopathy. No bone lesions. IMPRESSION: Tube positions as described without pneumothorax. Lungs hyperexpanded without edema or consolidation. Stable cardiac silhouette. There is aortic atherosclerosis. Aortic Atherosclerosis (ICD10-I70.0). Electronically Signed   By: Lowella Grip III M.D.   On: 10/02/2017 07:31   Dg Chest Portable 1 View  Result Date: 09/30/2017 CLINICAL DATA:  Orogastric tube placement EXAM: PORTABLE CHEST 1 VIEW COMPARISON:  Earlier today FINDINGS: An orogastric tube tip reaches the stomach. The side port is near the GE junction. Endotracheal tube tip between the clavicular heads and carina. Large lung volumes with lucent appearance. There is no edema, consolidation, effusion, or pneumothorax. Normal heart size and mediastinal contours.  Artifact from EKG leads. IMPRESSION: The new orogastric tube is in good position. Otherwise stable from earlier today. Electronically Signed   By: Monte Fantasia M.D.   On: 09/30/2017 18:48   Dg Chest Port 1 View  Result Date: 09/30/2017 CLINICAL DATA:  Status post intubation. EXAM: PORTABLE CHEST 1 VIEW COMPARISON:  September 30, 2017 FINDINGS: The ETT is in good position. No pneumothorax. Tiny effusions with atelectasis. No other changes. Emphysema. IMPRESSION: 1. The ETT is in good position.  No other changes. Electronically Signed   By: Dorise Bullion III M.D   On: 09/30/2017 17:28   Dg Chest Port 1 View  Result Date: 09/30/2017 CLINICAL DATA:  Shortness of breath. EXAM: PORTABLE CHEST 1 VIEW COMPARISON:  CT scan August 23, 2017.  Chest x-ray August 20, 2017. FINDINGS: Hyperexpansion of the lungs consistent with emphysema. Tiny pleural effusions and underlying atelectasis. No other interval changes. IMPRESSION: Emphysema.  Tiny pleural effusions and underlying atelectasis. Electronically Signed   By: Dorise Bullion III M.D   On: 09/30/2017 17:27   [place] Ekg Site Rite  Result Date: 10/04/2017 If Site Rite image not attached, placement could not be confirmed due to current cardiac rhythm.   Consults: Treatment Team:  [facility], [facility], MD   Subjective:    Overnight Issues:no significant change in status. After family discussion patient is pending tracheostomy. ENT consult placed yesterday  Objective:  Vital signs for last 24 hours: Temp:  [96.6 F (35.9 C)-98.5 F (36.9 C)] 97 F (36.1 C) (05/11 0800) Pulse Rate:  [65-139] 70 (05/11 0800) Resp:  [0-29] 16 (05/11 0800) BP: (88-216)/(60-114) 88/61 (05/11 0800) SpO2:  [94 %-99 %] 97 % (05/11 0800) FiO2 (%):  [35 %-40 %] 35 % (05/11 0819) Weight:  [127 lb 6.8 oz (57.8 kg)] 127 lb 6.8 oz (57.8 kg) (05/11 0500)  Hemodynamic parameters for last 24 hours:    Intake/Output from previous day: 05/10 0701 - 05/11 0700 In: 1502 [I.V.:942;  NG/GT:560] Out: 1300 [Urine:1300]  Intake/Output this shift: No intake/output data recorded.  Vent settings for last 24 hours: Vent Mode: PRVC FiO2 (%):  [35 %-40 %] 35 % Set Rate:  [16 bmp] 16 bmp Vt Set:  [50 mL-450 mL] 450 mL PEEP:  [5 cmH20] 5 cmH20  Physical Exam:  Presently patient is resting comfortably on mechanical ventilation. She is sedated and unresponsive Vital signs: Please see the above listed vital signs HEENT: Trachea is midline, oral endotracheal tube in place, no accessory muscle utilization, no jugular venous distention is noted Cardiovascular: Regular rate and rhythm, sinus mechanism noted on monitor Pulmonary: Prolonged expiratory phase, diffuse expiratory rhonchi and wheezes noted Abdominal: Hypoactive bowel sounds, generally soft exam Tremors: No clubbing cyanosis or edema noted Neurologic: Patient is sedated limited exam  Assessment/Plan:   Severe respiratory failure with acute bronchospasm. Patient is presently on Solu-Medrol, albuterol, Atrovent, completed antibiotics,sedation has been adjusted to include propofol and fentanyl. Will attempt spontaneous awakening and breathing trial today. If unsuccessful will discuss with family regarding tracheostomy and PEG tube placement versus comfort  Chest x-ray this morning endotracheal tube is high up will advance 2 cm  Anemia: No evidence of active bleeding  Leukocytosis. Resolved on antibiotics  Hyperglycemia. On sliding scale    Neils Siracusa 10/12/2017  *Care during the described time interval was provided by me and/or other providers on the critical care team.  I have reviewed this patient's available data, including medical history, events of note, physical examination and test results as part of my evaluation. Patient ID: PRINCELLA JASKIEWICZ, female   DOB: Sep 12, 1960, 57 y.o.   MRN: [id number] Patient ID: ASSATA JUNCAJ, female   DOB: January 18, 1961, 57 y.o.   MRN: [id number] Patient ID: BROOKLIN RIEGER, female   DOB:  19-Jun-1960, 57 y.o.   MRN: [id number] Patient ID: NIANI MOURER, female   DOB: 1960/10/18, 57 y.o.   MRN: [id number]

## 2017-10-12 NOTE — Consult Note (Signed)
Michelle, [name] [id number] 1961/04/02 Michelle [name], *  Reason for Consult: Tracheostomy tube placement  HPI: 57 y.o. Female admitted for COPD exacerbation and respiratory distress.  Required intubation in ER on 4/29 and now with need for long term ventilatory support.  Consulted to evaluate for tracheostomy tube placement.  Allergies:  Allergies  Allergen Reactions  . Carvedilol Other (See Comments)    Pt gets "sick" when on medication    ROS: Review of systems normal other than 12 systems except per HPI.  PMH:  Past Medical History:  Diagnosis Date  . Allergy   . Anxiety   . Asthma   . CHF (congestive heart failure) ([place])   . Cocaine abuse ([place])   . COPD (chronic obstructive pulmonary disease) ([place])   . Coronary artery disease   . Emphysema/COPD ([place])   . Hepatitis C   . High cholesterol   . Hypertension   . Pneumothorax   . Tobacco abuse     FH:  Family History  Problem Relation Age of Onset  . Lung cancer Mother   . Asthma Mother   . CVA Father   . CAD Father   . Stroke Father   . Breast cancer Maternal Aunt 67  . COPD Sister     SH:  Social History   Socioeconomic History  . Marital status: Single    Spouse name: Not on file  . Number of children: Not on file  . Years of education: Not on file  . Highest education level: Not on file  Occupational History  . Not on file  Social Needs  . Financial resource strain: Somewhat hard  . Food insecurity:    Worry: Sometimes true    Inability: Sometimes true  . Transportation needs:    Medical: Yes    Non-medical: Yes  Tobacco Use  . Smoking status: Current Every Day Smoker    Packs/day: 0.10    Years: 41.00    Pack years: 4.10    Types: Cigarettes  . Smokeless tobacco: Never Used  Substance and Sexual Activity  . Alcohol use: No    Frequency: Never  . Drug use: Yes    Types: "Crack" cocaine    Comment: Past cocaine use.  Quit 04/2017  . Sexual activity: Not Currently    Partners:  Male    Birth control/protection: Post-menopausal  Lifestyle  . Physical activity:    Days per week: 0 days    Minutes per session: 0 min  . Stress: To some extent  Relationships  . Social connections:    Talks on phone: Patient refused    Gets together: Patient refused    Attends religious service: Patient refused    Active member of club or organization: Patient refused    Attends meetings of clubs or organizations: Patient refused    Relationship status: Patient refused  . Intimate partner violence:    Fear of current or ex partner: Patient refused    Emotionally abused: Patient refused    Physically abused: Patient refused    Forced sexual activity: Patient refused  Other Topics Concern  . Not on file  Social History Narrative  . Not on file    PSH:  Past Surgical History:  Procedure Laterality Date  . CARDIAC CATHETERIZATION    . CHEST TUBE INSERTION      Physical  Exam:  GEN-  Sedated and intubated EARS-  External ears clear NOSE-  Clear anteriorly OC/OP-  ETT in place and secure  NECK-  Normal landmarks  A/P: 57 y.o. Female with respiratory failure and need for long term ventilation  Plan:  Patient is a tracheostomy tube candidate.  I have time on Thursday we can tentatively plan for.  Will see if can be done earlier if time/staffing available.  Please obtain consent from family and at midnight prior to procedure hold tube feeds and anticoagulation.   Michelle [name] 10/12/2017 5:17 PM

## 2017-10-12 NOTE — Progress Notes (Signed)
Pharmacy Consult:  Pharmacy consulted to assist with constipation management for 57 yo female admitted with COPD exacerbation requiring mechanical ventilation and sedation with fentanyl and midazolam drips. Patient has not had a documented bowel movement this admission.    Plan: Continue senna/docusate 2 tabs VT BID. Continue erythromycin 400mg  VT Q8hr. Will order bisacodyl suppository x 1 on 5/11. Will reorder daily until patient has bowel movement.    Pharmacy will continue to monitor and adjust per consult.   Pernell Dupre, PharmD, BCPS Clinical Pharmacist 10/12/2017 2:57 PM

## 2017-10-13 ENCOUNTER — Inpatient Hospital Stay: Payer: Medicaid Other

## 2017-10-13 LAB — GLUCOSE, CAPILLARY
GLUCOSE-CAPILLARY: 129 mg/dL — AB (ref 65–99)
Glucose-Capillary: 152 mg/dL — ABNORMAL HIGH (ref 65–99)
Glucose-Capillary: 151 mg/dL — ABNORMAL HIGH (ref 65–99)
Glucose-Capillary: 119 mg/dL — ABNORMAL HIGH (ref 65–99)

## 2017-10-13 MED ORDER — BISACODYL 10 MG RE SUPP
10.0000 mg | Freq: Once | RECTAL | Status: AC
  Administered 2017-10-13: 10 mg via RECTAL
  Filled 2017-10-13: qty 1

## 2017-10-13 NOTE — Progress Notes (Signed)
[facility] [place] NAME: Michelle [name]    MR#:  [id number]  DATE OF BIRTH:  09-13-1960  SUBJECTIVE:  CHIEF COMPLAINT:   Chief Complaint  Patient presents with  . Shortness of Breath   - Failed SBT- may need Trach tube.  REVIEW OF SYSTEMS:  Review of Systems  Unable to perform ROS: Critical illness    DRUG ALLERGIES:   Allergies  Allergen Reactions  . Carvedilol Other (See Comments)    Pt gets "sick" when on medication    VITALS:  Blood pressure (!) 146/85, pulse 71, temperature 97.6 F (36.4 C), temperature source Oral, resp. rate 11, height 4\' 9"  (1.448 m), weight 60.6 kg (133 lb 9.6 oz), last menstrual period 03/06/2005, SpO2 100 %.  PHYSICAL EXAMINATION:  Physical Exam  GENERAL:  57 y.o.-year-old patient lying in the bed with no acute distress.  EYES: Pupils equal, round, reactive to light and accommodation. No scleral icterus.  HEENT: Head atraumatic, normocephalic. Oropharynx and nasopharynx clear.orally intubated and has OG tube  NECK:  Supple, no jugular venous distention. No thyroid enlargement, no tenderness.  LUNGS: Normal breath sounds bilaterally, no wheezing, rales,rhonchi or crepitation. No use of accessory muscles of respiration. Decreased bibasilar breath sounds CARDIOVASCULAR: S1, S2 normal. No murmurs, rubs, or gallops.  ABDOMEN: Soft, nontender, nondistended.  Hypoactive bowel sounds present. No organomegaly or mass.  EXTREMITIES: No cyanosis, or clubbing. 1-2+ edema of hands and feet edema NEUROLOGIC: intubated and sedated.   PSYCHIATRIC: The patient is sedated.  SKIN: No obvious rash, lesion, or ulcer.    LABORATORY PANEL:   CBC Recent Labs  Lab 10/12/17 0522  WBC 10.6  HGB 9.5*  HCT 29.6*  PLT 221   ------------------------------------------------------------------------------------------------------------------  Chemistries  Recent Labs  Lab 10/12/17 0522  NA 138  K 4.2  CL 100*    CO2 34*  GLUCOSE 132*  BUN 35*  CREATININE 0.39*  CALCIUM 8.9  MG 2.0  AST 26  ALT 47  ALKPHOS 34*  BILITOT 0.4   ------------------------------------------------------------------------------------------------------------------  Cardiac Enzymes No results for input(s): TROPONINI in the last 168 hours. ------------------------------------------------------------------------------------------------------------------  RADIOLOGY:  Dg Chest Port 1 View  Result Date: 10/12/2017 CLINICAL DATA:  57 year old female intubated EXAM: PORTABLE CHEST 1 VIEW COMPARISON:  10/11/2017 FINDINGS: Cardiomediastinal silhouette unchanged in size and contour. No evidence of central vascular congestion. Unchanged position of the endotracheal tube position 7.7 cm above the carina. Unchanged position of the gastric tube, projecting over the mediastinum. The side port of the gastric tube terminates near or above the GE junction. Unchanged right upper extremity PICC. Similar appearance of linear opacities at the lung bases without confluent airspace disease. No acute displaced fracture. IMPRESSION: Endotracheal tube terminates suitably above the carina. Gastric tube terminates with the side-port near or above the GE junction. Unchanged right upper extremity PICC. Similar appearance of the lungs with basilar atelectasis/scarring. Electronically Signed   By: Corrie Mckusick D.O.   On: 10/12/2017 11:50   Dg Chest Port 1 View  Result Date: 10/11/2017 CLINICAL DATA:  Intubated.  COPD.  Smoker. EXAM: PORTABLE CHEST 1 VIEW COMPARISON:  10/09/2017. FINDINGS: Endotracheal tube tip at the level of the clavicles. Nasogastric tube extending into the stomach. Right PICC tip in the proximal superior vena cava. Normal sized heart. Hyperexpanded lungs. Small amount of linear scarring in the left mid lung zone. Otherwise, the lungs are clear. Positional scoliosis and with patient rotation to the right. IMPRESSION: 1. No acute  abnormality. 2. Stable changes of COPD. 3. Right PICC tip in the superior vena cava. This could be advanced 7 cm to place it at the superior cavoatrial junction. Electronically Signed   By: Claudie Revering M.D.   On: 10/11/2017 13:18    EKG:   Orders placed or performed during the hospital encounter of 09/30/17  . EKG 12-Lead  . EKG 12-Lead  . EKG 12-Lead  . EKG 12-Lead    ASSESSMENT AND PLAN:   [name] a57 y.o.femalewith a known history of anxiety, CHF, cocaine abuse, COPD, coronary artery disease, hepatitis C, hypertension admitted for acute respiratory failure and intubated.  1.  Acute on chronic respiratory failure with hypoxia-secondary to acute on chronic COPD exacerbation -Remains on vent, continue weaning trial as tolerated -Appreciate pulmonary management  -On steroids, nebs.  Finished Zosyn for bronchitis/pneumonia -Vancomycin was added for fevers- but discontinued now- no further fevers-  -Wean sedation and spontaneous breathing trial again today.  - Needs to discuss about possibility of Trach with family if unable to be weaned off, as been on vent for 10 days  2.  Hypotension-secondary to sedation medications.  Low-dose Levophed.  3.  Anxiety-continue Xanax scheduled.  Also on propofol, fentanyl drips and risperidone  4.  DVT prophylaxis-subcutaneous heparin  Discussed with ICU attending   All the records are reviewed and case discussed with Care Management/Social Workerr. Management plans discussed with the patient, family and they are in agreement.  CODE STATUS: Full Code  TOTAL TIME TAKING CARE OF THIS PATIENT: 31 minutes.   POSSIBLE D/C IN ? DAYS, DEPENDING ON CLINICAL CONDITION.   [name] M.D on 10/13/2017 at 10:14 AM  Between 7am to 6pm - Pager - [phone]  After 6pm go to [email or web address] - password [email or web address] [facility]  Office  [phone]  CC: Primary care physician; Center, [facility]

## 2017-10-13 NOTE — Progress Notes (Signed)
Pharmacy Consult:  Pharmacy consulted to assist with constipation management for 57 yo female admitted with COPD exacerbation requiring mechanical ventilation and sedation with fentanyl and midazolam drips. Patient has not had a documented bowel movement this admission.    Plan: Continue senna/docusate 2 tabs VT BID. Continue erythromycin 400mg  VT Q8hr. Will order bisacodyl suppository x 1 on 5/11. Will reorder daily until patient has bowel movement.    Pharmacy will continue to monitor and adjust per consult.   Paulina Fusi, PharmD, BCPS 10/13/2017 3:07 PM

## 2017-10-13 NOTE — Progress Notes (Signed)
[facility] [place] NAME: Michelle [name]    MR#:  [id number]  DATE OF BIRTH:  [birth year]  SUBJECTIVE:  CHIEF COMPLAINT:   Chief Complaint  Patient presents with  . Shortness of Breath   - Failed SBT- may need Trach tube.  REVIEW OF SYSTEMS:  Review of Systems  Unable to perform ROS: Critical illness    DRUG ALLERGIES:   Allergies  Allergen Reactions  . Carvedilol Other (See Comments)    Pt gets "sick" when on medication    VITALS:  Blood pressure 127/73, pulse 65, temperature 99.1 F (37.3 C), temperature source Oral, resp. rate 16, height 4\' 9"  (1.448 m), weight 60.6 kg (133 lb 9.6 oz), last menstrual period 03/06/2005, SpO2 99 %.  PHYSICAL EXAMINATION:  Physical Exam  GENERAL:  57 y.o.-year-old patient lying in the bed with no acute distress.  EYES: Pupils equal, round, reactive to light and accommodation. No scleral icterus.  HEENT: Head atraumatic, normocephalic. Oropharynx and nasopharynx clear.orally intubated and has OG tube  NECK:  Supple, no jugular venous distention. No thyroid enlargement, no tenderness.  LUNGS: Normal breath sounds bilaterally, no wheezing, rales,rhonchi or crepitation. No use of accessory muscles of respiration. Decreased bibasilar breath sounds CARDIOVASCULAR: S1, S2 normal. No murmurs, rubs, or gallops.  ABDOMEN: Soft, nontender, nondistended.  Hypoactive bowel sounds present. No organomegaly or mass.  EXTREMITIES: No cyanosis, or clubbing. 1-2+ edema of hands and feet edema NEUROLOGIC: intubated and sedated.   PSYCHIATRIC: The patient is sedated.  SKIN: No obvious rash, lesion, or ulcer.    LABORATORY PANEL:   CBC Recent Labs  Lab 10/12/17 0522  WBC 10.6  HGB 9.5*  HCT 29.6*  PLT 221   ------------------------------------------------------------------------------------------------------------------  Chemistries  Recent Labs  Lab 10/12/17 0522  NA 138  K 4.2  CL 100*  CO2 34*   GLUCOSE 132*  BUN 35*  CREATININE 0.39*  CALCIUM 8.9  MG 2.0  AST 26  ALT 47  ALKPHOS 34*  BILITOT 0.4   ------------------------------------------------------------------------------------------------------------------  Cardiac Enzymes No results for input(s): TROPONINI in the last 168 hours. ------------------------------------------------------------------------------------------------------------------  RADIOLOGY:  Dg Chest Port 1 View  Result Date: 10/13/2017 CLINICAL DATA:  Encounter for patient on mechanically assisted ventilation. EXAM: PORTABLE CHEST 1 VIEW COMPARISON:  10/12/2017 FINDINGS: endotracheal tube is in place with tip approximately 8 centimeters above the carina. Nasogastric tube is in place, tip beyond the cast septal junction and overlying the region of the gastric fundus. A RIGHT UPPER extremity PICC line tip overlies the superior vena cava. Lungs are mildly hyperinflated. There is pleural thickening or small pleural effusions. Bilateral mid lung zone atelectasis. No pulmonary edema. IMPRESSION: 1. Hyperinflation and minimal bilateral atelectasis. 2. Lines and tubes as described. Electronically Signed   By: Nolon Nations M.D.   On: 10/13/2017 10:22   Dg Chest Port 1 View  Result Date: 10/12/2017 CLINICAL DATA:  57 year old female intubated EXAM: PORTABLE CHEST 1 VIEW COMPARISON:  10/11/2017 FINDINGS: Cardiomediastinal silhouette unchanged in size and contour. No evidence of central vascular congestion. Unchanged position of the endotracheal tube position 7.7 cm above the carina. Unchanged position of the gastric tube, projecting over the mediastinum. The side port of the gastric tube terminates near or above the GE junction. Unchanged right upper extremity PICC. Similar appearance of linear opacities at the lung bases without confluent airspace disease. No acute displaced fracture. IMPRESSION: Endotracheal tube terminates suitably above the carina. Gastric tube  terminates with the  side-port near or above the GE junction. Unchanged right upper extremity PICC. Similar appearance of the lungs with basilar atelectasis/scarring. Electronically Signed   By: Corrie Mckusick D.O.   On: 10/12/2017 11:50    EKG:   Orders placed or performed during the hospital encounter of 09/30/17  . EKG 12-Lead  . EKG 12-Lead  . EKG 12-Lead  . EKG 12-Lead    ASSESSMENT AND PLAN:   [name] a57 y.o.femalewith a known history of anxiety, CHF, cocaine abuse, COPD, coronary artery disease, hepatitis C, hypertension admitted for acute respiratory failure and intubated.  1.  Acute on chronic respiratory failure with hypoxia-secondary to acute on chronic COPD exacerbation -Remains on vent, continue weaning trial as tolerated -Appreciate pulmonary management  -On steroids, nebs.  Finished Zosyn for bronchitis/pneumonia -Vancomycin was added for fevers- but discontinued now- no further fevers-  -Wean sedation and spontaneous breathing trial again today.  - Needs to discuss about possibility of Trach with family if unable to be weaned off, as been on vent for   >10 days  2.  Hypotension-secondary to sedation medications.  Low-dose Levophed.  3.  Anxiety-continue Xanax scheduled.  Also on propofol, fentanyl drips and risperidone  4.  DVT prophylaxis-subcutaneous heparin  Discussed with ICU attending   All the records are reviewed and case discussed with Care Management/Social Workerr. Management plans discussed with the patient, family and they are in agreement.  CODE STATUS: Full Code  TOTAL TIME TAKING CARE OF THIS PATIENT: 31 minutes.   POSSIBLE D/C IN ? DAYS, DEPENDING ON CLINICAL CONDITION.   [name] M.D on 10/13/2017 at 4:51 PM  Between 7am to 6pm - Pager - [phone]  After 6pm go to [email or web address] - password [email or web address] [facility]  Office  [phone]  CC: Primary care physician; Center, [facility]

## 2017-10-13 NOTE — Progress Notes (Signed)
Follow up - Critical Care Medicine Note  Patient Details:    Michelle [name] is an 57 y.o. female.with a known history of anxiety, CHF, cocaine abuse, COPD, coronary artery disease, hepatitis C, hypertension, intubated on mechanical ventilation.   Lines, Airways, Drains: Airway 7.5 mm (Active)  Secured at (cm) 21 cm 10/07/2017  8:00 AM  Measured From Lips 10/07/2017  8:00 AM  Secured Location Left 10/07/2017  8:00 AM  Secured By [employer] 10/07/2017  8:00 AM  Tube Holder Repositioned Yes 10/07/2017  8:00 AM  Cuff Pressure (cm H2O) 25 cm H2O 10/07/2017  8:00 AM  Site Condition Dry 10/07/2017  8:00 AM     PICC Triple Lumen 10/04/17 PICC Right Brachial 36 cm 0 cm (Active)  Indication for Insertion or Continuance of Line Prolonged intravenous therapies 10/07/2017  7:44 AM  Exposed Catheter (cm) 0 cm 10/04/2017  5:00 PM  Site Assessment Clean;Dry;Intact 10/07/2017  7:30 AM  Lumen #1 Status Infusing;Flushed;Blood return noted 10/07/2017  7:30 AM  Lumen #2 Status Infusing;Flushed;Blood return noted 10/07/2017  7:30 AM  Lumen #3 Status Infusing;Flushed;Blood return noted 10/07/2017  7:30 AM  Dressing Type Transparent 10/07/2017  7:30 AM  Dressing Status Intact;Dry;Clean;Antimicrobial disc in place 10/07/2017  7:30 AM  Line Care Connections checked and tightened 10/07/2017  7:30 AM  Line Adjustment (NICU/IV Team Only) No 10/06/2017  2:15 PM  Dressing Intervention Dressing changed;Antimicrobial disc changed 10/07/2017  4:02 AM  Dressing Change Due 10/11/17 10/07/2017  7:30 AM     NG/OG Tube Orogastric 14 Fr. Left mouth Aucultation Documented cm marking at nare/ corner of mouth 55 cm (Active)  Cm Marking at Nare/Corner of Mouth (if applicable) 55 cm 01/02/174  7:30 AM  External Length of Tube (cm) - (if applicable) 55 cm 1/0/2585  8:03 PM  Site Assessment Clean;Dry;Intact 10/07/2017  7:30 AM  Ongoing Placement Verification No change in respiratory status;No acute changes, not attributed to clinical condition;Xray;No  change in cm markings or external length of tube from initial placement 10/07/2017  7:30 AM  Status Infusing tube feed 10/07/2017  7:30 AM  Intake (mL) 40 mL 10/06/2017  2:00 PM     Urethral Catheter ANNA AND STUDENT CNA Latex 16 Fr. (Active)  Indication for Insertion or Continuance of Catheter Other (comment) 10/07/2017  7:30 AM  Site Assessment Clean;Intact;Dry 10/07/2017  7:30 AM  Catheter Maintenance Bag below level of bladder;Catheter secured;Drainage bag/tubing not touching floor;Insertion date on drainage bag;No dependent loops;Seal intact 10/07/2017  7:44 AM  Collection Container Standard drainage bag 10/07/2017  7:30 AM  Securement Method Securing device (Describe) 10/07/2017  7:30 AM  Urinary Catheter Interventions Unclamped 10/07/2017  7:30 AM  Output (mL) 550 mL 10/07/2017  8:00 AM    Anti-infectives:  Anti-infectives (From admission, onward)   Start     Dose/Rate Route Frequency Ordered Stop   10/09/17 1100  erythromycin ethylsuccinate (EES) 200 MG/5ML suspension 400 mg     400 mg Per Tube Every 8 hours 10/09/17 1028     10/07/17 0900  vancomycin (VANCOCIN) 500 mg in sodium chloride 0.9 % 100 mL IVPB  Status:  Discontinued     500 mg 100 mL/hr over 60 Minutes Intravenous Every 8 hours 10/07/17 0343 10/07/17 1033   10/06/17 0100  vancomycin (VANCOCIN) 500 mg in sodium chloride 0.9 % 100 mL IVPB  Status:  Discontinued     500 mg 100 mL/hr over 60 Minutes Intravenous Every 12 hours 10/05/17 1540 10/07/17 0343   10/05/17 1145  vancomycin (VANCOCIN) IVPB 1000 mg/200 mL premix     1,000 mg 200 mL/hr over 60 Minutes Intravenous  Once 10/05/17 1132 10/05/17 1401   10/01/17 2200  levofloxacin (LEVAQUIN) IVPB 750 mg  Status:  Discontinued     750 mg 100 mL/hr over 90 Minutes Intravenous Every 48 hours 10/01/17 2150 10/02/17 1054   10/01/17 1800  levofloxacin (LEVAQUIN) IVPB 500 mg  Status:  Discontinued     500 mg 100 mL/hr over 60 Minutes Intravenous Daily-1800 09/30/17 1853 10/01/17 0804    10/01/17 0900  piperacillin-tazobactam (ZOSYN) IVPB 3.375 g     3.375 g 12.5 mL/hr over 240 Minutes Intravenous Every 8 hours 10/01/17 0804 10/08/17 0545   09/30/17 1700  levofloxacin (LEVAQUIN) IVPB 750 mg     750 mg 100 mL/hr over 90 Minutes Intravenous  Once 09/30/17 1653 10/01/17 0819      Microbiology: Results for orders placed or performed during the hospital encounter of 09/30/17  CULTURE, BLOOD (ROUTINE X 2) w Reflex to ID Panel     Status: None   Collection Time: 09/30/17  5:43 PM  Result Value Ref Range Status   Specimen Description BLOOD CENTRAL LINE  Final   Special Requests   Final    BOTTLES DRAWN AEROBIC AND ANAEROBIC Blood Culture adequate volume   Culture   Final    NO GROWTH 5 DAYS Performed at [facility], [address] [place], [place] [address]    Report Status 10/05/2017 FINAL  Final  MRSA PCR Screening     Status: None   Collection Time: 09/30/17  8:02 PM  Result Value Ref Range Status   MRSA by PCR NEGATIVE NEGATIVE Final    Comment:        The GeneXpert MRSA Assay (FDA approved for NASAL specimens only), is one component of a comprehensive MRSA colonization surveillance program. It is not intended to diagnose MRSA infection nor to guide or monitor treatment for MRSA infections. Performed at [facility], [address] [place], [place] [address]   Urine Culture     Status: None   Collection Time: 09/30/17  8:04 PM  Result Value Ref Range Status   Specimen Description   Final    URINE, RANDOM Performed at [facility], [address] [place], [place] [address]    Special Requests   Final    NONE Performed at [facility], [address] [place], [place] [address]    Culture   Final    NO GROWTH Performed at [facility], [place] [address], [place], [place] [address]    Report Status 10/02/2017 FINAL  Final  CULTURE, BLOOD (ROUTINE X 2) w Reflex to ID Panel     Status: None   Collection  Time: 09/30/17  8:16 PM  Result Value Ref Range Status   Specimen Description BLOOD L HAND  Final   Special Requests   Final    BOTTLES DRAWN AEROBIC AND ANAEROBIC Blood Culture results may not be optimal due to an inadequate volume of blood received in culture bottles   Culture   Final    NO GROWTH 5 DAYS Performed at [facility], [address] [place],  [address]    Report Status 10/05/2017 FINAL  Final  Culture, respiratory (NON-Expectorated)     Status: None   Collection Time: 09/30/17  9:07 PM  Result Value Ref Range Status   Specimen Description   Final    TRACHEAL ASPIRATE  Performed at [facility], [address], [place], [place] [address]    Special Requests   Final    NONE Performed at [facility], [place], [place] [address]    Gram Stain   Final    MODERATE WBC PRESENT, PREDOMINANTLY PMN FEW GRAM POSITIVE COCCI IN PAIRS IN CLUSTERS FEW GRAM NEGATIVE RODS    Culture   Final    FEW Consistent with normal respiratory flora. Performed at [facility], [place] [address] [place], [place] [address]    Report Status 10/03/2017 FINAL  Final  CULTURE, BLOOD (ROUTINE X 2) w Reflex to ID Panel     Status: None   Collection Time: 10/05/17 11:48 AM  Result Value Ref Range Status   Specimen Description BLOOD LAC  Final   Special Requests   Final    BOTTLES DRAWN AEROBIC AND ANAEROBIC Blood Culture adequate volume   Culture   Final    NO GROWTH 5 DAYS Performed at [facility], [address] [place], [place] [address]    Report Status 10/10/2017 FINAL  Final  CULTURE, BLOOD (ROUTINE X 2) w Reflex to ID Panel     Status: None   Collection Time: 10/05/17 11:55 AM  Result Value Ref Range Status   Specimen Description BLOOD LT HAND  Final   Special Requests   Final    BOTTLES DRAWN AEROBIC AND ANAEROBIC Blood Culture adequate volume   Culture   Final    NO GROWTH 5 DAYS Performed at [facility], [address] [place], [place] [address]    Report Status 10/10/2017 FINAL  Final  Culture, expectorated sputum-assessment     Status: None   Collection Time: 10/05/17  2:18 PM  Result Value Ref Range Status   Specimen Description TRACHEAL ASPIRATE  Final   Special Requests NONE  Final   Sputum evaluation   Final    THIS SPECIMEN IS ACCEPTABLE FOR SPUTUM CULTURE Performed at [facility], [address] [place], [place] [address]    Report Status 10/05/2017 FINAL  Final  Culture, respiratory (NON-Expectorated)     Status: None   Collection Time: 10/05/17  2:18 PM  Result Value Ref Range Status   Specimen Description   Final    TRACHEAL ASPIRATE Performed at [facility], [address] [place], [place] [address]    Special Requests   Final    NONE Reflexed from [id number] Performed at [facility], [address] [place], [place] [address]    Gram Stain   Final    MODERATE WBC PRESENT,BOTH PMN AND MONONUCLEAR NO SQUAMOUS EPITHELIAL CELLS SEEN FEW YEAST WITH PSEUDOHYPHAE RARE GRAM POSITIVE COCCI IN CLUSTERS Performed at [facility], [place] [address], [place], [place] [address]    Culture FEW CANDIDA ALBICANS FEW CANDIDA DUBLINIENSIS   Final   Report Status 10/08/2017 FINAL  Final    Studies: Dg Abd 1 View  Result Date: 10/06/2017 CLINICAL DATA:  Initial evaluation for ileus. EXAM: ABDOMEN - 1 VIEW COMPARISON:  Prior radiograph from 10/04/2017. FINDINGS: Enteric tube in place with tip overlying the gastric body, side hole at or just beyond the GE junction. Few mildly prominent gas-filled loops of bowel overlie the mid and left abdomen without overt evidence for obstruction. No abnormal bowel wall thickening. No soft tissue mass or abnormal calcification. Tubal ligation clips overlie the pelvis. Visualized lung bases are clear. IMPRESSION: 1. Few mildly prominent  gas-filled loops of bowel overlying the mid and left abdomen without evidence  for obstruction. 2. Enteric tube in place with tip overlying the gastric body, side hole at or just beyond the level of the GE junction. Electronically Signed   By: Jeannine Boga M.D.   On: 10/06/2017 13:13   Dg Abd 1 View  Result Date: 10/04/2017 CLINICAL DATA:  History of pneumonia, history of acute renal injury, smoking history EXAM: ABDOMEN - 1 VIEW COMPARISON:  Portable chest x-ray of 10/03/2017 FINDINGS: A supine view of the abdomen shows an NG tube extending into the proximal body of the stomach. The bowel gas pattern is nonspecific. No bowel obstruction is seen. A vascular sheath overlies the right pelvis. No bony abnormality is noted. IMPRESSION: 1. NG tube extends to a point overlying the proximal body of the stomach. 2. No bowel obstruction. Electronically Signed   By: Ivar Drape M.D.   On: 10/04/2017 09:54   Dg Chest Port 1 View  Result Date: 10/12/2017 CLINICAL DATA:  57 year old female intubated EXAM: PORTABLE CHEST 1 VIEW COMPARISON:  10/11/2017 FINDINGS: Cardiomediastinal silhouette unchanged in size and contour. No evidence of central vascular congestion. Unchanged position of the endotracheal tube position 7.7 cm above the carina. Unchanged position of the gastric tube, projecting over the mediastinum. The side port of the gastric tube terminates near or above the GE junction. Unchanged right upper extremity PICC. Similar appearance of linear opacities at the lung bases without confluent airspace disease. No acute displaced fracture. IMPRESSION: Endotracheal tube terminates suitably above the carina. Gastric tube terminates with the side-port near or above the GE junction. Unchanged right upper extremity PICC. Similar appearance of the lungs with basilar atelectasis/scarring. Electronically Signed   By: Corrie Mckusick D.O.   On: 10/12/2017 11:50   Dg Chest Port 1 View  Result Date: 10/11/2017 CLINICAL DATA:  Intubated.  COPD.  Smoker. EXAM: PORTABLE CHEST 1 VIEW COMPARISON:   10/09/2017. FINDINGS: Endotracheal tube tip at the level of the clavicles. Nasogastric tube extending into the stomach. Right PICC tip in the proximal superior vena cava. Normal sized heart. Hyperexpanded lungs. Small amount of linear scarring in the left mid lung zone. Otherwise, the lungs are clear. Positional scoliosis and with patient rotation to the right. IMPRESSION: 1. No acute abnormality. 2. Stable changes of COPD. 3. Right PICC tip in the superior vena cava. This could be advanced 7 cm to place it at the superior cavoatrial junction. Electronically Signed   By: Claudie Revering M.D.   On: 10/11/2017 13:18   Dg Chest Port 1 View  Result Date: 10/09/2017 CLINICAL DATA:  Intubated. EXAM: PORTABLE CHEST 1 VIEW COMPARISON:  10/07/2017 FINDINGS: Endotracheal tube tip is 6 cm above the carina. Nasogastric tube enters the abdomen. Right arm PICC tip in the SVC at the azygos level. Emphysematous changes of the lungs as noted previously. No consolidation, collapse or effusion. IMPRESSION: Lines and tubes well positioned. Emphysema. No focal finding otherwise. Electronically Signed   By: [place] Chimes M.D.   On: 10/09/2017 11:54   Dg Chest Port 1 View  Result Date: 10/07/2017 CLINICAL DATA:  Atelectasis. EXAM: PORTABLE CHEST 1 VIEW COMPARISON:  Oct 06, 2017 FINDINGS: The ETT is in good position. The NG tube terminates below today's film. A right PICC line terminates in the central SVC, stable. No pneumothorax. The cardiomediastinal silhouette is normal. Haziness in the medial right lung base may represent vascular crowding/overlying soft tissue given positioning. Infiltrate considered less likely. No other  interval changes. IMPRESSION: 1. Support apparatus as above. 2. There is haziness over the medial right lung base. I suspect this is either vascular crowding or overlying soft tissue attenuation given positioning. Subtle infiltrate considered less likely. Recommend attention on follow-up. Electronically Signed    By: Dorise Bullion III M.D   On: 10/07/2017 09:01   Dg Chest Port 1 View  Result Date: 10/06/2017 CLINICAL DATA:  Pneumonia EXAM: PORTABLE CHEST 1 VIEW COMPARISON:  10/04/2017 chest radiograph. FINDINGS: Endotracheal tube tip is 6.3 cm above the carina. Enteric tube enters stomach with the tip not seen on this image. Right PICC terminates in the lower third of the SVC. Stable cardiomediastinal silhouette with normal heart size. No pneumothorax. No pleural effusion. Hyperinflated lungs. No pulmonary edema. Mild left basilar scarring versus atelectasis. No acute consolidative airspace disease. IMPRESSION: 1. Well-positioned support structures as detailed. 2. Hyperinflated lungs, suggesting COPD. 3. Stable mild left basilar scarring versus atelectasis. Electronically Signed   By: Ilona Sorrel M.D.   On: 10/06/2017 07:15   Dg Chest Port 1 View  Result Date: 10/04/2017 CLINICAL DATA:  Pneumonia EXAM: PORTABLE CHEST 1 VIEW COMPARISON:  10/03/2017 FINDINGS: Endotracheal tube and NG tube are unchanged. Hyperinflation of the lungs. Heart is normal size. Minimal left base atelectasis. No effusions. Right lung clear. No acute bony abnormality. IMPRESSION: Hyperinflation/COPD.  Left base atelectasis. Electronically Signed   By: Rolm Baptise M.D.   On: 10/04/2017 09:51   Dg Chest Port 1 View  Result Date: 10/03/2017 CLINICAL DATA:  Atelectasis EXAM: PORTABLE CHEST 1 VIEW COMPARISON:  10/02/2017 FINDINGS: Endotracheal tube in good position.  NG tube in the stomach. COPD with hyperinflation. Negative for infiltrate effusion or edema. IMPRESSION: Endotracheal tube in good position. COPD.  The lungs remain clear. Electronically Signed   By: Franchot Gallo M.D.   On: 10/03/2017 09:16   Dg Chest Port 1 View  Result Date: 10/02/2017 CLINICAL DATA:  Hypoxia EXAM: PORTABLE CHEST 1 VIEW COMPARISON:  September 30, 2017 FINDINGS: Endotracheal tube tip is 4.5 cm above the carina. Nasogastric tube tip and side port are below the  diaphragm. No pneumothorax. Lungs are hyperexpanded. There is no edema or consolidation. The heart size is normal. Pulmonary vascularity is within normal limits. There is aortic atherosclerosis. No adenopathy. No bone lesions. IMPRESSION: Tube positions as described without pneumothorax. Lungs hyperexpanded without edema or consolidation. Stable cardiac silhouette. There is aortic atherosclerosis. Aortic Atherosclerosis (ICD10-I70.0). Electronically Signed   By: Lowella Grip III M.D.   On: 10/02/2017 07:31   Dg Chest Portable 1 View  Result Date: 09/30/2017 CLINICAL DATA:  Orogastric tube placement EXAM: PORTABLE CHEST 1 VIEW COMPARISON:  Earlier today FINDINGS: An orogastric tube tip reaches the stomach. The side port is near the GE junction. Endotracheal tube tip between the clavicular heads and carina. Large lung volumes with lucent appearance. There is no edema, consolidation, effusion, or pneumothorax. Normal heart size and mediastinal contours. Artifact from EKG leads. IMPRESSION: The new orogastric tube is in good position. Otherwise stable from earlier today. Electronically Signed   By: Monte Fantasia M.D.   On: 09/30/2017 18:48   Dg Chest Port 1 View  Result Date: 09/30/2017 CLINICAL DATA:  Status post intubation. EXAM: PORTABLE CHEST 1 VIEW COMPARISON:  September 30, 2017 FINDINGS: The ETT is in good position. No pneumothorax. Tiny effusions with atelectasis. No other changes. Emphysema. IMPRESSION: 1. The ETT is in good position.  No other changes. Electronically Signed   By: Dorise Bullion  III M.D   On: 09/30/2017 17:28   Dg Chest Port 1 View  Result Date: 09/30/2017 CLINICAL DATA:  Shortness of breath. EXAM: PORTABLE CHEST 1 VIEW COMPARISON:  CT scan August 23, 2017.  Chest x-ray August 20, 2017. FINDINGS: Hyperexpansion of the lungs consistent with emphysema. Tiny pleural effusions and underlying atelectasis. No other interval changes. IMPRESSION: Emphysema.  Tiny pleural effusions and  underlying atelectasis. Electronically Signed   By: Dorise Bullion III M.D   On: 09/30/2017 17:27   [place] Ekg Site Rite  Result Date: 10/04/2017 If Site Rite image not attached, placement could not be confirmed due to current cardiac rhythm.   Consults: Treatment Team:  [facility], [facility], MD   Subjective:    Overnight Issues:no significant change in status. After family discussion patient is pending tracheostomy. ENT consult placed yesterday  Objective:  Vital signs for last 24 hours: Temp:  [97.3 F (36.3 C)-99.8 F (37.7 C)] 97.6 F (36.4 C) (05/12 0800) Pulse Rate:  [71-103] 71 (05/12 0800) Resp:  [0-18] 11 (05/12 0900) BP: (88-157)/(57-85) 146/85 (05/12 0900) SpO2:  [93 %-100 %] 100 % (05/12 0800) FiO2 (%):  [35 %] 35 % (05/12 0800) Weight:  [133 lb 9.6 oz (60.6 kg)] 133 lb 9.6 oz (60.6 kg) (05/12 0500)  Hemodynamic parameters for last 24 hours:    Intake/Output from previous day: 05/11 0701 - 05/12 0700 In: 1101.7 [I.V.:591.7; NG/GT:510] Out: 1300 [Urine:1300]  Intake/Output this shift: Total I/O In: 353 [I.V.:133; NG/GT:220] Out: -   Vent settings for last 24 hours: Vent Mode: PRVC FiO2 (%):  [35 %] 35 % Set Rate:  [16 bmp] 16 bmp Vt Set:  [450 mL] 450 mL PEEP:  [5 cmH20] 5 cmH20 Plateau Pressure:  [15 cmH20-17 cmH20] 15 cmH20  Physical Exam:  Presently patient is resting comfortably on mechanical ventilation. She is sedated and unresponsive Vital signs: Please see the above listed vital signs HEENT: Trachea is midline, oral endotracheal tube in place, no accessory muscle utilization, no jugular venous distention is noted Cardiovascular: Regular rate and rhythm, sinus mechanism noted on monitor Pulmonary: Prolonged expiratory phase, diffuse expiratory rhonchi and wheezes noted Abdominal: Hypoactive bowel sounds, generally soft exam Tremors: No clubbing cyanosis or edema noted Neurologic: Patient is sedated limited exam  Assessment/Plan:   Severe  respiratory failure with acute bronchospasm. Patient is presently on Solu-Medrol, albuterol, Atrovent, completed antibiotics,sedation has been adjusted to include propofol and fentanyl. Will attempt spontaneous awakening and breathing trial today. If unsuccessful will discuss with family regarding tracheostomy and PEG tube placement versus comfort  Anemia: No evidence of active bleeding  Leukocytosis. Resolved on antibiotics  Hyperglycemia. On sliding scale    Michelle [name] 10/13/2017  *Care during the described time interval was provided by me and/or other providers on the critical care team.  I have reviewed this patient's available data, including medical history, events of note, physical examination and test results as part of my evaluation. Patient ID: Michelle [name], female   DOB: 1960/07/29, 57 y.o.   MRN: [id number] Patient ID: Michelle [name], female   DOB: 1961-02-26, 57 y.o.   MRN: [id number] Patient ID: Michelle [name], female   DOB: December 19, 1960, 57 y.o.   MRN: [id number] Patient ID: Michelle [name], female   DOB: May 27, 1961, 57 y.o.   MRN: [id number] Patient ID: Michelle [name], female   DOB: 1960-09-06, 57 y.o.   MRN: [id number]

## 2017-10-14 ENCOUNTER — Inpatient Hospital Stay: Payer: Medicaid Other

## 2017-10-14 DIAGNOSIS — J9811 Atelectasis: Secondary | ICD-10-CM

## 2017-10-14 LAB — GLUCOSE, CAPILLARY
GLUCOSE-CAPILLARY: 114 mg/dL — AB (ref 65–99)
GLUCOSE-CAPILLARY: 120 mg/dL — AB (ref 65–99)
GLUCOSE-CAPILLARY: 127 mg/dL — AB (ref 65–99)
GLUCOSE-CAPILLARY: 226 mg/dL — AB (ref 65–99)
Glucose-Capillary: 109 mg/dL — ABNORMAL HIGH (ref 65–99)
Glucose-Capillary: 124 mg/dL — ABNORMAL HIGH (ref 65–99)
Glucose-Capillary: 129 mg/dL — ABNORMAL HIGH (ref 65–99)
Glucose-Capillary: 141 mg/dL — ABNORMAL HIGH (ref 65–99)
Glucose-Capillary: 155 mg/dL — ABNORMAL HIGH (ref 65–99)
Glucose-Capillary: 177 mg/dL — ABNORMAL HIGH (ref 65–99)

## 2017-10-14 MED ORDER — VECURONIUM BROMIDE 10 MG IV SOLR
4.0000 mg | Freq: Once | INTRAVENOUS | Status: AC
Start: 2017-10-14 — End: 2017-10-14
  Administered 2017-10-14: 4 mg via INTRAVENOUS
  Filled 2017-10-14: qty 10

## 2017-10-14 MED ORDER — STERILE WATER FOR INJECTION IJ SOLN
INTRAMUSCULAR | Status: AC
  Administered 2017-10-14: 18:00:00
  Filled 2017-10-14: qty 10

## 2017-10-14 MED ORDER — VITAL AF 1.2 CAL PO LIQD
1000.0000 mL | ORAL | Status: DC
Start: 2017-10-14 — End: 2017-11-06
  Administered 2017-10-14 – 2017-11-05 (×18): 1000 mL

## 2017-10-14 NOTE — Progress Notes (Signed)
Nutrition Follow-up  DOCUMENTATION CODES:   Non-severe (moderate) malnutrition in context of chronic illness  INTERVENTION:  Initiate new goal TF regimen of Vital AF 1.2 at 45 mL/hr (1080 mL goal daily volume) via OGT. Provides 1296 kcal, 81 grams of protein, 875 mL H2O daily.  Will discontinue Pro-Stat.  Continue liquid MVI daily per tube.  With current free water flush of 100 mL Q6hrs patient is receiving a total of 1275 mL H2O daily including water in tube feeds.  NUTRITION DIAGNOSIS:   Moderate Malnutrition related to chronic illness(COPD, CHF, hepatitis C, cocaine abuse) as evidenced by mild fat depletion, moderate fat depletion, mild muscle depletion, moderate muscle depletion.  Ongoing - addressing with TF regimen.  GOAL:   Provide needs based on ASPEN/SCCM guidelines  Met with TF regimen.  MONITOR:   Vent status, Labs, Weight trends, TF tolerance, I & O's  REASON FOR ASSESSMENT:   Ventilator    ASSESSMENT:   57 year old female with PMHx of emphysema, COPD, HTN, hypercholesterolemia, hx pneumothorax s/p chest tube insertion, CAD s/p cardiac catheterization, cocaine abuse, tobacco abuse, asthma, CHF, anxiety, hepatitis C admitted with acute exacerbation of COPD, acute on chronic respiratory failure requiring intubation 4/29, PNA, cocaine intoxication, AKI due to renal hypoperfusion.   -ENT was consulted and saw pt on 5/11 for tracheostomy tube placement. Tentative plan is for 5/16.  Patient remains intubated and sedated. Failed SBT this AM. Plan is to advance ETT and attempt another SBT later today.  Access: 14 Fr. OGT placed 4/29; terminates in gastric body per abdominal x-ray 5/5; 54 cm at corner of mouth  MAP: 75-93 mmHg  TF: pt has been tolerating Vital High Protein at 20 mL/hr + Pro-Stat 30 mL BID  Patient is currently intubated on ventilator support Ve: 6.5 L/min Temp (24hrs), Avg:98.9 F (37.2 C), Min:98.1 F (36.7 C), Max:99.3 F (37.4  C)  Propofol: N/A  Medications reviewed and include: Xanax, erythromycin 400 mg Q8hrs per tube, famotidine, free water flush 100 mL Q6hrs, Novolog 0-9 units Q4hrs, Solu-Medrol 40 mg Q8hrs IV, liquid MVI daily per tube, senna-docusate, fentanyl gtt, Versed gtt. Propofol off.  Labs reviewed: CBG 119-155 past 24 hrs. On 5/11 Chloride 100, CO2 34, BUN 35, Creatinine 0.39, Anion gap 4.  I/O: 1000 mL UOP yesterday (0.7 mL/kg/hr)  Weight trend: 59.7 kg on 5/13; + 14 kg from admission  Discussed with RN and on rounds. Tube feeds were held for tracheostomy, but the earliest that could actually be done is Thursday, so tube feeds being restarted.  Diet Order:   Diet Order           Diet NPO time specified  Diet effective midnight          EDUCATION NEEDS:   No education needs have been identified at this time  Skin:  Skin Assessment: Reviewed RN Assessment  Last BM:  10/13/2017 - smear type 6  Height:   Ht Readings from Last 1 Encounters:  10/01/17 _0  (1.448 m)    Weight:   Wt Readings from Last 1 Encounters:  10/14/17 131 lb 9.8 oz (59.7 kg)    Ideal Body Weight:  43.2 kg  BMI:  Body mass index is 28.48 kg/m.  Estimated Nutritional Needs:   Kcal:  1324 (PSU 2003b w/ MSJ 1053, Ve 7.4, Tmax 37.7)  Protein:  70-80 grams (1.5-1.8 grams/kg)  Fluid:  1.4-1.6 L/day (30-35 mL/kg)  Willey Blade, MS, RD, LDN Office: [phone] Pager: [phone] After Hours/Weekend Pager: [phone]

## 2017-10-14 NOTE — Progress Notes (Signed)
PULMONARY / CRITICAL CARE MEDICINE   Name: Michelle [name] MRN: [id number] DOB: Dec 23, 1960    ADMISSION DATE:  09/30/2017   HISTORY OF PRESENT ILLNESS:   [name] a57 y.o.femalewith a known history of anxiety, CHF, cocaine abuse, COPD, coronary artery disease, hepatitis C, hypertension admitted for acute respiratory failure and intubated.   REVIEW OF SYSTEMS:   unobtainable  SUBJECTIVE:  On vent, dynsoea on SBT after 20 minutes.  ET tube noted to be too proximal  VITAL SIGNS: BP 128/77   Pulse (!) 104   Temp 98.1 F (36.7 C) (Oral)   Resp 16   Ht 4\' 9"  (1.448 m)   Wt 131 lb 9.8 oz (59.7 kg)   LMP 03/06/2005 (Approximate)   SpO2 93%   BMI 28.48 kg/m   HEMODYNAMICS:  no compromise  VENTILATOR SETTINGS: Vent Mode: PRVC FiO2 (%):  [35 %] 35 % Set Rate:  [16 bmp] 16 bmp Vt Set:  [450 mL] 450 mL PEEP:  [5 cmH20] 5 cmH20 Pressure Support:  [5 cmH20] 5 cmH20 Plateau Pressure:  [16 cmH20-18 cmH20] 18 cmH20  INTAKE / OUTPUT: I/O last 3 completed shifts: In: 1270.6 [I.V.:603.3; NG/GT:667.3] Out: 1650 [Urine:1650]  PHYSICAL EXAMINATION: GENERAL:  sedated with no acute distress.  EYES: Pupils equal, round, reactive to light and accommodation. No scleral icterus.  HEENT: Head atraumatic, normocephalic. ET tube in place NECK:  Supple, no jugular venous distention. No thyroid enlargement, no tenderness.  LUNGS: Normal breath sounds bilaterally, no wheezing, rales,rhonchi or crepitation. No use of accessory muscles of respiration. Decreased bibasilar breath sounds CARDIOVASCULAR: S1, S2 normal. No murmurs, rubs, or gallops.  ABDOMEN: Soft, nontender, nondistended.  Hypoactive bowel sounds present. No organomegaly or mass.  EXTREMITIES: No cyanosis, or clubbing. 1-2+ edema of hands and feet edema NEUROLOGIC:Followed all commands on sedation holiday  PSYCHIATRIC:  sedated.  SKIN: No obvious rash, lesion, or ulcer.    LABS:  BMET Recent Labs  Lab 10/08/17 0433  10/12/17 0522  NA 143 138  K 4.4 4.2  CL 101 100*  CO2 36* 34*  BUN 26* 35*  CREATININE 0.57 0.39*  GLUCOSE 171* 132*    Electrolytes Recent Labs  Lab 10/08/17 0433 10/12/17 0522  CALCIUM 8.9 8.9  MG  --  2.0  PHOS  --  3.3    CBC Recent Labs  Lab 10/09/17 0535 10/10/17 0439 10/12/17 0522  WBC 13.3* 14.3* 10.6  HGB 8.8* 8.3* 9.5*  HCT 27.5* 25.3* 29.6*  PLT 237 223 221    Coag's No results for input(s): APTT, INR in the last 168 hours.  Sepsis Markers No results for input(s): LATICACIDVEN, PROCALCITON, O2SATVEN in the last 168 hours.  ABG No results for input(s): PHART, PCO2ART, PO2ART in the last 168 hours.  Liver Enzymes Recent Labs  Lab 10/12/17 0522  AST 26  ALT 47  ALKPHOS 34*  BILITOT 0.4  ALBUMIN 2.6*    Cardiac Enzymes No results for input(s): TROPONINI, PROBNP in the last 168 hours.  Glucose Recent Labs  Lab 10/13/17 1732 10/13/17 1947 10/14/17 0024 10/14/17 0537 10/14/17 0802 10/14/17 1126  GLUCAP 129* 109* 141* 114* 120* 155*    Imaging Dg Chest Port 1 View  Result Date: 10/14/2017 CLINICAL DATA:  Intubation EXAM: PORTABLE CHEST 1 VIEW COMPARISON:  10/13/2017 FINDINGS: Endotracheal tube tip between the clavicular heads and carina. Right upper extremity PICC with tip at the SVC. An orogastric tube reaches the stomach. Hyperinflation and lucency. Minor atelectasis greater on the left. There is  no edema, consolidation, effusion, or pneumothorax. Normal heart size and mediastinal contours. IMPRESSION: 1. Unremarkable hardware positioning. 2. Hyperinflation with mild atelectasis on the left. Electronically Signed   By: Monte Fantasia M.D.   On: 10/14/2017 12:55     STUDIES:  nil  CULTURES: nil  ANTIBIOTICS: nil  SIGNIFICANT EVENTS: Dysnoea on SBT  LINES/TUBES: Peripheral IVs, Foley cath  DISCUSSION: This is a57 y.o.femalewith a known history of anxiety, CHF, cocaine abuse, COPD, coronary artery disease, hepatitis C,  hypertension admitted for acute respiratory failure and intubated.  ASSESSMENT / PLAN: 1.  Acute on chronic respiratory failure with hypoxia-secondary to acute on chronic COPD exacerbation. ET tube advanced this AM. Will retry SBT -Remains on vent, continue weaning trial as tolerated -On steroids, nebs.  Completed Zosyn for bronchitis/pneumonia/ Atelectasis -Wean sedation and spontaneous breathing trial again this afternoon.  - Needs to discuss about possibility of Trach with family if unable to be weaned off, as been on vent for  >10 days.  2.  Hypotension-secondary to sedation medications.  Low-dose Levophed as needed but has not been on Levophed in 24 hours  3.  Anxiety-continue Xanax scheduled.  Also on propofol, fentanyl drips and risperidone  4.  DVT prophylaxis-subcutaneous heparin    FAMILY  - Updates: Will update when available  I have dedicated a total of 37 minutes in critical care time minus all appropriate exclusions.   Cammie Sickle, MD Pulmonary and [place] Pager: [phone]  10/14/2017, 2:10 PM

## 2017-10-14 NOTE — Progress Notes (Signed)
[place] at [place] NAME: Michelle [name]    MR#:  [id number]  DATE OF BIRTH:  November 18, 1960  SUBJECTIVE:  CHIEF COMPLAINT:   Chief Complaint  Patient presents with  . Shortness of Breath   - Failed SBT- may need Trach tube.  REVIEW OF SYSTEMS:  Review of Systems  Unable to perform ROS: Critical illness    DRUG ALLERGIES:   Allergies  Allergen Reactions  . Carvedilol Other (See Comments)    Pt gets "sick" when on medication    VITALS:  Blood pressure 129/86, pulse (!) 108, temperature 99.9 F (37.7 C), temperature source Oral, resp. rate 16, height 4\' 9"  (1.448 m), weight 59.7 kg (131 lb 9.8 oz), last menstrual period 03/06/2005, SpO2 94 %.  PHYSICAL EXAMINATION:  Physical Exam  GENERAL:  57 y.o.-year-old patient lying in the bed with no acute distress.  EYES: Pupils equal, round, reactive to light and accommodation. No scleral icterus.  HEENT: Head atraumatic, normocephalic. Oropharynx and nasopharynx clear.orally intubated and has OG tube  NECK:  Supple, no jugular venous distention. No thyroid enlargement, no tenderness.  LUNGS: Normal breath sounds bilaterally, no wheezing, rales,rhonchi or crepitation. No use of accessory muscles of respiration. Decreased bibasilar breath sounds CARDIOVASCULAR: S1, S2 normal. No murmurs, rubs, or gallops.  ABDOMEN: Soft, nontender, nondistended.  Hypoactive bowel sounds present. No organomegaly or mass.  EXTREMITIES: No cyanosis, or clubbing. 1-2+ edema of hands and feet edema NEUROLOGIC: intubated and sedated.   PSYCHIATRIC: The patient is sedated.  SKIN: No obvious rash, lesion, or ulcer.    LABORATORY PANEL:   CBC Recent Labs  Lab 10/12/17 0522  WBC 10.6  HGB 9.5*  HCT 29.6*  PLT 221   ------------------------------------------------------------------------------------------------------------------  Chemistries  Recent Labs  Lab 10/12/17 0522  NA 138  K 4.2  CL 100*    CO2 34*  GLUCOSE 132*  BUN 35*  CREATININE 0.39*  CALCIUM 8.9  MG 2.0  AST 26  ALT 47  ALKPHOS 34*  BILITOT 0.4   ------------------------------------------------------------------------------------------------------------------  Cardiac Enzymes No results for input(s): TROPONINI in the last 168 hours. ------------------------------------------------------------------------------------------------------------------  RADIOLOGY:  Dg Chest Port 1 View  Result Date: 10/14/2017 CLINICAL DATA:  Intubation EXAM: PORTABLE CHEST 1 VIEW COMPARISON:  10/13/2017 FINDINGS: Endotracheal tube tip between the clavicular heads and carina. Right upper extremity PICC with tip at the SVC. An orogastric tube reaches the stomach. Hyperinflation and lucency. Minor atelectasis greater on the left. There is no edema, consolidation, effusion, or pneumothorax. Normal heart size and mediastinal contours. IMPRESSION: 1. Unremarkable hardware positioning. 2. Hyperinflation with mild atelectasis on the left. Electronically Signed   By: Monte Fantasia M.D.   On: 10/14/2017 12:55   Dg Chest Port 1 View  Result Date: 10/13/2017 CLINICAL DATA:  Encounter for patient on mechanically assisted ventilation. EXAM: PORTABLE CHEST 1 VIEW COMPARISON:  10/12/2017 FINDINGS: endotracheal tube is in place with tip approximately 8 centimeters above the carina. Nasogastric tube is in place, tip beyond the cast septal junction and overlying the region of the gastric fundus. A RIGHT UPPER extremity PICC line tip overlies the superior vena cava. Lungs are mildly hyperinflated. There is pleural thickening or small pleural effusions. Bilateral mid lung zone atelectasis. No pulmonary edema. IMPRESSION: 1. Hyperinflation and minimal bilateral atelectasis. 2. Lines and tubes as described. Electronically Signed   By: Nolon Nations M.D.   On: 10/13/2017 10:22    EKG:   Orders placed or performed during  the hospital encounter of 09/30/17   . EKG 12-Lead  . EKG 12-Lead  . EKG 12-Lead  . EKG 12-Lead    ASSESSMENT AND PLAN:   [name] a57 y.o.femalewith a known history of anxiety, CHF, cocaine abuse, COPD, coronary artery disease, hepatitis C, hypertension admitted for acute respiratory failure and intubated.  1.  Acute on chronic respiratory failure with hypoxia-secondary to acute on chronic COPD exacerbation -Remains on vent, continue weaning trial as tolerated -Appreciate pulmonary management  -On steroids, nebs.  Finished Zosyn for bronchitis/pneumonia -Vancomycin was added for fevers- but discontinued now- no further fevers-  -Wean sedation and spontaneous breathing trial again today.  - Needs to discuss about possibility of Trach with family if unable to be weaned off, as been on vent for   >10 days- ENT consult placed.  2.  Hypotension-secondary to sedation medications.  Low-dose Levophed.  3.  Anxiety-continue Xanax scheduled.  Also on propofol, fentanyl drips and risperidone  4.  DVT prophylaxis-subcutaneous heparin  Discussed with ICU attending  All the records are reviewed and case discussed with Care Management/Social Workerr. Management plans discussed with the patient, family and they are in agreement.  CODE STATUS: Full Code  TOTAL TIME TAKING CARE OF THIS PATIENT: 31 minutes.   POSSIBLE D/C IN ? DAYS, DEPENDING ON CLINICAL CONDITION.   [name] M.D on 10/14/2017 at 5:42 PM  Between 7am to 6pm - Pager - [phone]  After 6pm go to [email or web address] - password [email or web address] [facility]  Office  [phone]  CC: Primary care physician; Center, [facility]

## 2017-10-14 NOTE — Progress Notes (Signed)
Pharmacy Consult:  Pharmacy consulted to assist with constipation management for 57 yo female admitted with COPD exacerbation requiring mechanical ventilation and sedation with fentanyl and midazolam drips. Patient has not had a documented bowel movement this admission.    Plan: 5/13 Last bowel movement 5/12. Continue senna/docusate 2 tabs VT BID. Continue erythromycin 400mg  VT Q8hr.   Pharmacy will continue to monitor and adjust per consult.   Pernell Dupre, PharmD, BCPS Clinical Pharmacist 10/14/2017 9:32 AM

## 2017-10-15 LAB — BASIC METABOLIC PANEL
Anion gap: 4 — ABNORMAL LOW (ref 5–15)
BUN: 45 mg/dL — ABNORMAL HIGH (ref 6–20)
CALCIUM: 9 mg/dL (ref 8.9–10.3)
CO2: 33 mmol/L — ABNORMAL HIGH (ref 22–32)
Chloride: 99 mmol/L — ABNORMAL LOW (ref 101–111)
Creatinine, Ser: 0.51 mg/dL (ref 0.44–1.00)
Glucose, Bld: 182 mg/dL — ABNORMAL HIGH (ref 65–99)
Potassium: 4 mmol/L (ref 3.5–5.1)
SODIUM: 136 mmol/L (ref 135–145)

## 2017-10-15 LAB — BLOOD GAS, ARTERIAL
Acid-Base Excess: 9.2 mmol/L — ABNORMAL HIGH (ref 0.0–2.0)
BICARBONATE: 34.1 mmol/L — AB (ref 20.0–28.0)
FIO2: 0.35
LHR: 22 {breaths}/min
O2 Saturation: 97.9 %
PEEP/CPAP: 8 cmH2O
Patient temperature: 37
VT: 450 mL
pCO2 arterial: 48 mmHg (ref 32.0–48.0)
pH, Arterial: 7.46 — ABNORMAL HIGH (ref 7.350–7.450)
pO2, Arterial: 97 mmHg (ref 83.0–108.0)

## 2017-10-15 LAB — GLUCOSE, CAPILLARY
GLUCOSE-CAPILLARY: 189 mg/dL — AB (ref 65–99)
Glucose-Capillary: 176 mg/dL — ABNORMAL HIGH (ref 65–99)
Glucose-Capillary: 192 mg/dL — ABNORMAL HIGH (ref 65–99)
Glucose-Capillary: 118 mg/dL — ABNORMAL HIGH (ref 65–99)
Glucose-Capillary: 126 mg/dL — ABNORMAL HIGH (ref 65–99)
Glucose-Capillary: 108 mg/dL — ABNORMAL HIGH (ref 65–99)

## 2017-10-15 LAB — CBC
HCT: 27.8 % — ABNORMAL LOW (ref 35.0–47.0)
HEMOGLOBIN: 9 g/dL — AB (ref 12.0–16.0)
MCH: 29.4 pg (ref 26.0–34.0)
MCHC: 32.5 g/dL (ref 32.0–36.0)
MCV: 90.6 fL (ref 80.0–100.0)
PLATELETS: 191 10*3/uL (ref 150–440)
RBC: 3.07 MIL/uL — ABNORMAL LOW (ref 3.80–5.20)
RDW: 15.1 % — AB (ref 11.5–14.5)
WBC: 8.3 10*3/uL (ref 3.6–11.0)

## 2017-10-15 MED ORDER — CLONIDINE HCL 0.3 MG/24HR TD PTWK
0.3000 mg | MEDICATED_PATCH | TRANSDERMAL | Status: DC
  Administered 2017-10-29: 0.3 mg via TRANSDERMAL
  Filled 2017-10-15 (×3): qty 1

## 2017-10-15 MED ORDER — FREE WATER
100.0000 mL | Freq: Three times a day (TID) | Status: DC
  Administered 2017-10-15 – 2017-11-24 (×110): 100 mL

## 2017-10-15 MED ORDER — IPRATROPIUM-ALBUTEROL 0.5-2.5 (3) MG/3ML IN SOLN
3.0000 mL | Freq: Four times a day (QID) | RESPIRATORY_TRACT | Status: DC
  Administered 2017-10-15 – 2017-12-31 (×305): 3 mL via RESPIRATORY_TRACT
  Filled 2017-10-15 (×308): qty 3

## 2017-10-15 MED ORDER — METHYLPREDNISOLONE SODIUM SUCC 40 MG IJ SOLR
40.0000 mg | Freq: Two times a day (BID) | INTRAMUSCULAR | Status: DC
  Administered 2017-10-15 – 2017-10-17 (×4): 40 mg via INTRAVENOUS
  Filled 2017-10-15 (×4): qty 1

## 2017-10-15 MED ORDER — IPRATROPIUM-ALBUTEROL 0.5-2.5 (3) MG/3ML IN SOLN: 3.0000 mL | RESPIRATORY_TRACT | Status: DC | PRN

## 2017-10-15 MED ORDER — METOPROLOL TARTRATE 5 MG/5ML IV SOLN
INTRAVENOUS | Status: AC
  Administered 2017-10-15: 2.5 mg
  Filled 2017-10-15: qty 5

## 2017-10-15 MED ORDER — HYDRALAZINE HCL 20 MG/ML IJ SOLN
10.0000 mg | INTRAMUSCULAR | Status: DC | PRN
  Administered 2017-10-17 – 2017-11-07 (×6): 20 mg via INTRAVENOUS
  Filled 2017-10-15 (×7): qty 1

## 2017-10-15 MED ORDER — METOPROLOL TARTRATE 5 MG/5ML IV SOLN
2.5000 mg | INTRAVENOUS | Status: DC | PRN
  Administered 2017-10-15 – 2017-10-17 (×4): 2.5 mg via INTRAVENOUS
  Administered 2017-10-17 – 2017-10-18 (×2): 5 mg via INTRAVENOUS
  Administered 2017-10-19 – 2017-10-21 (×3): 2.5 mg via INTRAVENOUS
  Administered 2017-10-26 – 2017-11-04 (×5): 5 mg via INTRAVENOUS
  Administered 2017-11-04 – 2017-11-07 (×3): 2.5 mg via INTRAVENOUS
  Filled 2017-10-15 (×17): qty 5

## 2017-10-15 NOTE — Progress Notes (Signed)
[place] at [place] NAME: Kareemah Grounds    MR#:  [id number]  DATE OF BIRTH:  [birth year]  SUBJECTIVE:  CHIEF COMPLAINT:   Chief Complaint  Patient presents with  . Shortness of Breath   - Failed SBT- may need Trach tube.   Intencivist was planning to give a trial again.  REVIEW OF SYSTEMS:  Review of Systems  Unable to perform ROS: Critical illness    DRUG ALLERGIES:   Allergies  Allergen Reactions  . Carvedilol Other (See Comments)    Pt gets "sick" when on medication    VITALS:  Blood pressure (!) 156/87, pulse (!) 114, temperature 97.7 F (36.5 C), resp. rate 15, height 4\' 9"  (1.448 m), weight 61.2 kg (134 lb 14.7 oz), last menstrual period 03/06/2005, SpO2 97 %.  PHYSICAL EXAMINATION:  Physical Exam  GENERAL:  57 y.o.-year-old patient lying in the bed with no acute distress.  EYES: Pupils equal, round, reactive to light and accommodation. No scleral icterus.  HEENT: Head atraumatic, normocephalic. Oropharynx and nasopharynx clear.orally intubated and has OG tube  NECK:  Supple, no jugular venous distention. No thyroid enlargement, no tenderness.  LUNGS: Normal breath sounds bilaterally, no wheezing, rales,rhonchi or crepitation. No use of accessory muscles of respiration. Decreased bibasilar breath sounds CARDIOVASCULAR: S1, S2 normal. No murmurs, rubs, or gallops.  ABDOMEN: Soft, nontender, nondistended.  Hypoactive bowel sounds present. No organomegaly or mass.  EXTREMITIES: No cyanosis, or clubbing. 1-2+ edema of hands and feet edema NEUROLOGIC: intubated and sedated.   PSYCHIATRIC: The patient is sedated.  SKIN: No obvious rash, lesion, or ulcer.    LABORATORY PANEL:   CBC Recent Labs  Lab 10/15/17 0444  WBC 8.3  HGB 9.0*  HCT 27.8*  PLT 191   ------------------------------------------------------------------------------------------------------------------  Chemistries  Recent Labs  Lab 10/12/17 0522  10/15/17 0444  NA 138 136  K 4.2 4.0  CL 100* 99*  CO2 34* 33*  GLUCOSE 132* 182*  BUN 35* 45*  CREATININE 0.39* 0.51  CALCIUM 8.9 9.0  MG 2.0  --   AST 26  --   ALT 47  --   ALKPHOS 34*  --   BILITOT 0.4  --    ------------------------------------------------------------------------------------------------------------------  Cardiac Enzymes No results for input(s): TROPONINI in the last 168 hours. ------------------------------------------------------------------------------------------------------------------  RADIOLOGY:  Dg Chest Port 1 View  Result Date: 10/14/2017 CLINICAL DATA:  Intubation EXAM: PORTABLE CHEST 1 VIEW COMPARISON:  10/13/2017 FINDINGS: Endotracheal tube tip between the clavicular heads and carina. Right upper extremity PICC with tip at the SVC. An orogastric tube reaches the stomach. Hyperinflation and lucency. Minor atelectasis greater on the left. There is no edema, consolidation, effusion, or pneumothorax. Normal heart size and mediastinal contours. IMPRESSION: 1. Unremarkable hardware positioning. 2. Hyperinflation with mild atelectasis on the left. Electronically Signed   By: Monte Fantasia M.D.   On: 10/14/2017 12:55    EKG:   Orders placed or performed during the hospital encounter of 09/30/17  . EKG 12-Lead  . EKG 12-Lead  . EKG 12-Lead  . EKG 12-Lead    ASSESSMENT AND PLAN:   [name] a57 y.o.femalewith a known history of anxiety, CHF, cocaine abuse, COPD, coronary artery disease, hepatitis C, hypertension admitted for acute respiratory failure and intubated.  1.  Acute on chronic respiratory failure with hypoxia-secondary to acute on chronic COPD exacerbation -Remains on vent, continue weaning trial as tolerated -Appreciate pulmonary management  -On steroids, nebs.  Finished Zosyn for  bronchitis/pneumonia -Vancomycin was added for fevers- but discontinued now- no further fevers-  -Wean sedation and spontaneous breathing trial  tried multiple days. - Needs to discuss about possibility of Trach with family if unable to be weaned off, as been on vent for  >10 days- ENT consult placed.  2.  Hypotension-secondary to sedation medications. Low-dose Levophed.  3.  Anxiety-continue Xanax scheduled.  Also on propofol, fentanyl drips and risperidone  4.  DVT prophylaxis-subcutaneous heparin  Discussed with ICU attending  All the records are reviewed and case discussed with Care Management/Social Workerr. Management plans discussed with the patient, family and they are in agreement.  CODE STATUS: Full Code  TOTAL TIME TAKING CARE OF THIS PATIENT: 31 minutes.   POSSIBLE D/C IN ? DAYS, DEPENDING ON CLINICAL CONDITION.   [name] M.D on 10/15/2017 at 9:58 PM  Between 7am to 6pm - Pager - [phone]  After 6pm go to [email or web address] - password [email or web address] [facility]  Office  [phone]  CC: Primary care physician; Center, [facility]

## 2017-10-15 NOTE — Progress Notes (Signed)
Pharmacy Consult:  Pharmacy consulted to assist with constipation management for 57 yo female admitted with COPD exacerbation requiring mechanical ventilation and sedation with fentanyl and midazolam drips. Patient has not had a documented bowel movement this admission.    Plan: 5/13 Last bowel movement 5/12. Continue senna/docusate 2 tabs VT BID. Continue erythromycin 400mg  VT Q8hr.   Pharmacy will continue to monitor and adjust per consult.   Pernell Dupre, PharmD, BCPS Clinical Pharmacist 10/15/2017 9:36 AM

## 2017-10-15 NOTE — Progress Notes (Signed)
   10/15/17 1114  Clinical Encounter Type  Visited With Patient  Visit Type Follow-up  Spiritual Encounters  Spiritual Needs Prayer   Chaplain met patient sister on unit and sister requested chaplain pray with patient.  Chaplain prayed at bedside for patient, family, and care team.

## 2017-10-16 ENCOUNTER — Inpatient Hospital Stay: Payer: Medicaid Other

## 2017-10-16 DIAGNOSIS — J9622 Acute and chronic respiratory failure with hypercapnia: Secondary | ICD-10-CM

## 2017-10-16 LAB — BASIC METABOLIC PANEL
ANION GAP: 5 (ref 5–15)
BUN: 35 mg/dL — AB (ref 6–20)
CHLORIDE: 98 mmol/L — AB (ref 101–111)
CO2: 34 mmol/L — ABNORMAL HIGH (ref 22–32)
Calcium: 9.2 mg/dL (ref 8.9–10.3)
Creatinine, Ser: 0.43 mg/dL — ABNORMAL LOW (ref 0.44–1.00)
Glucose, Bld: 158 mg/dL — ABNORMAL HIGH (ref 65–99)
POTASSIUM: 4.3 mmol/L (ref 3.5–5.1)
Sodium: 137 mmol/L (ref 135–145)

## 2017-10-16 LAB — GLUCOSE, CAPILLARY
GLUCOSE-CAPILLARY: 138 mg/dL — AB (ref 65–99)
GLUCOSE-CAPILLARY: 160 mg/dL — AB (ref 65–99)
GLUCOSE-CAPILLARY: 165 mg/dL — AB (ref 65–99)
GLUCOSE-CAPILLARY: 167 mg/dL — AB (ref 65–99)
Glucose-Capillary: 164 mg/dL — ABNORMAL HIGH (ref 65–99)
Glucose-Capillary: 135 mg/dL — ABNORMAL HIGH (ref 65–99)
Glucose-Capillary: 117 mg/dL — ABNORMAL HIGH (ref 65–99)

## 2017-10-16 LAB — CBC
HEMATOCRIT: 30.2 % — AB (ref 35.0–47.0)
HEMOGLOBIN: 9.7 g/dL — AB (ref 12.0–16.0)
MCH: 29 pg (ref 26.0–34.0)
MCHC: 32 g/dL (ref 32.0–36.0)
MCV: 90.5 fL (ref 80.0–100.0)
Platelets: 203 10*3/uL (ref 150–440)
RBC: 3.34 MIL/uL — ABNORMAL LOW (ref 3.80–5.20)
RDW: 15.4 % — ABNORMAL HIGH (ref 11.5–14.5)
WBC: 11.7 10*3/uL — AB (ref 3.6–11.0)

## 2017-10-16 NOTE — Progress Notes (Signed)
Pharmacy Consult:  Pharmacy consulted to assist with constipation management for 57 yo female admitted with COPD exacerbation requiring mechanical ventilation and sedation with fentanyl and midazolam drips. Patient has not had a documented bowel movement this admission.    Plan: 5/13 Last bowel movement 5/14. Continue senna/docusate 2 tabs VT BID. Continue erythromycin 400mg  VT Q8hr.   Pharmacy will continue to monitor and adjust per consult.   Pernell Dupre, PharmD, BCPS Clinical Pharmacist 10/16/2017 2:07 PM

## 2017-10-16 NOTE — Progress Notes (Signed)
[place] at [place] NAME: Michelle [name]    MR#:  [id number]  DATE OF BIRTH:  21-Apr-1961  SUBJECTIVE:  CHIEF COMPLAINT:   Chief Complaint  Patient presents with  . Shortness of Breath   - Failed SBT- may need Trach tube.   Intencivist was planning to give a trial again.  REVIEW OF SYSTEMS:  Review of Systems  Unable to perform ROS: Critical illness    DRUG ALLERGIES:   Allergies  Allergen Reactions  . Carvedilol Other (See Comments)    Pt gets "sick" when on medication    VITALS:  Blood pressure 130/85, pulse 81, temperature 98.6 F (37 C), temperature source Bladder, resp. rate 10, height 4\' 9"  (1.448 m), weight 63.4 kg (139 lb 12.4 oz), last menstrual period 03/06/2005, SpO2 98 %.  PHYSICAL EXAMINATION:  Physical Exam  GENERAL:  57 y.o.-year-old patient lying in the bed with no acute distress.  EYES: Pupils equal, round, reactive to light and accommodation. No scleral icterus.  HEENT: Head atraumatic, normocephalic. Oropharynx and nasopharynx clear.orally intubated and has OG tube  NECK:  Supple, no jugular venous distention. No thyroid enlargement, no tenderness.  LUNGS: Normal breath sounds bilaterally, no wheezing, rales,rhonchi or crepitation. No use of accessory muscles of respiration. Decreased bibasilar breath sounds CARDIOVASCULAR: S1, S2 normal. No murmurs, rubs, or gallops.  ABDOMEN: Soft, nontender, nondistended.  Hypoactive bowel sounds present. No organomegaly or mass.  EXTREMITIES: No cyanosis, or clubbing. 1-2+ edema of hands and feet edema NEUROLOGIC: intubated and sedated.   PSYCHIATRIC: The patient is sedated.  SKIN: No obvious rash, lesion, or ulcer.    LABORATORY PANEL:   CBC Recent Labs  Lab 10/16/17 0548  WBC 11.7*  HGB 9.7*  HCT 30.2*  PLT 203   ------------------------------------------------------------------------------------------------------------------  Chemistries  Recent Labs    Lab 10/12/17 0522  10/16/17 0548  NA 138   < > 137  K 4.2   < > 4.3  CL 100*   < > 98*  CO2 34*   < > 34*  GLUCOSE 132*   < > 158*  BUN 35*   < > 35*  CREATININE 0.39*   < > 0.43*  CALCIUM 8.9   < > 9.2  MG 2.0  --   --   AST 26  --   --   ALT 47  --   --   ALKPHOS 34*  --   --   BILITOT 0.4  --   --    < > = values in this interval not displayed.   ------------------------------------------------------------------------------------------------------------------  Cardiac Enzymes No results for input(s): TROPONINI in the last 168 hours. ------------------------------------------------------------------------------------------------------------------  RADIOLOGY:  Dg Chest Port 1 View  Result Date: 10/16/2017 CLINICAL DATA:  Respiratory failure. EXAM: PORTABLE CHEST 1 VIEW COMPARISON:  Radiograph of Oct 14, 2017. FINDINGS: Endotracheal and nasogastric tubes are unchanged in position. Right-sided PICC line is unchanged in position. No pneumothorax or pleural effusion is noted. No acute pulmonary disease is noted. Bony thorax is unremarkable. IMPRESSION: Stable support apparatus. No acute cardiopulmonary abnormality seen. Electronically Signed   By: Marijo Conception, M.D.   On: 10/16/2017 07:22    EKG:   Orders placed or performed during the hospital encounter of 09/30/17  . EKG 12-Lead  . EKG 12-Lead  . EKG 12-Lead  . EKG 12-Lead    ASSESSMENT AND PLAN:   [name] a57 y.o.femalewith a known history of anxiety, CHF, cocaine abuse, COPD,  coronary artery disease, hepatitis C, hypertension admitted for acute respiratory failure and intubated.  1.  Acute on chronic respiratory failure with hypoxia-secondary to acute on chronic COPD exacerbation -Remains on vent, continue weaning trial as tolerated -Appreciate pulmonary management  -On steroids, nebs.  Finished Zosyn for bronchitis/pneumonia -Vancomycin was added for fevers- but discontinued now- no further fevers-   -Wean sedation and spontaneous breathing trial tried multiple days. - Needs to discuss about possibility of Trach with family if unable to be weaned off, as been on vent for  >10 days- ENT consult placed. Trach planned for tomorrow.  2.  Hypotension-secondary to sedation medications. Low-dose Levophed.  3.  Anxiety-continue Xanax scheduled.  Also on propofol, fentanyl drips and risperidone  4.  DVT prophylaxis-subcutaneous heparin  Discussed with ICU attending  All the records are reviewed and case discussed with Care Management/Social Workerr. Management plans discussed with the patient, family and they are in agreement.  CODE STATUS: Full Code  TOTAL TIME TAKING CARE OF THIS PATIENT: 31 minutes.   POSSIBLE D/C IN ? DAYS, DEPENDING ON CLINICAL CONDITION.   Michelle [name] M.D on 10/16/2017 at 3:57 PM  Between 7am to 6pm - Pager - [phone]  After 6pm go to [email or web address] - password [email or web address] [facility]  Office  [phone]  CC: Primary care physician; Center, [facility]

## 2017-10-16 NOTE — Anesthesia Preprocedure Evaluation (Addendum)
Anesthesia Evaluation  Patient identified by MRN, date of birth, ID band Patient awake    Reviewed: Allergy & Precautions, H&P , NPO status , Patient's Chart, lab work & pertinent test results, reviewed documented beta blocker date and time   History of Anesthesia Complications Negative for: history of anesthetic complications  Airway Mallampati: Intubated  TM Distance: >3 FB Neck ROM: full    Dental  (+) Chipped, Poor Dentition   Pulmonary neg pulmonary ROS, shortness of breath, asthma , COPD, Current Smoker,    Pulmonary exam normal        Cardiovascular Exercise Tolerance: Poor hypertension, + CAD and +CHF  negative cardio ROS Normal cardiovascular exam Rhythm:regular Rate:Normal     Neuro/Psych Anxiety negative neurological ROS  negative psych ROS   GI/Hepatic negative GI ROS, Neg liver ROS, (+) Hepatitis -  Endo/Other  negative endocrine ROS  Renal/GU negative Renal ROS  negative genitourinary   Musculoskeletal   Abdominal   Peds  Hematology negative hematology ROS (+)   Anesthesia Other Findings Past Medical History: No date: Allergy No date: Anxiety No date: Asthma No date: CHF (congestive heart failure) (HCC) No date: Cocaine abuse ([place]) No date: COPD (chronic obstructive pulmonary disease) (HCC) No date: Coronary artery disease No date: Emphysema/COPD ([place]) No date: Hepatitis C No date: High cholesterol No date: Hypertension No date: Pneumothorax No date: Tobacco abuse  Past Surgical History: No date: CARDIAC CATHETERIZATION No date: CHEST TUBE INSERTION  BMI    Body Mass Index:  30.25 kg/m      Reproductive/Obstetrics negative OB ROS                            Anesthesia Physical Anesthesia Plan  ASA: IV  Anesthesia Plan: General ETT   Post-op Pain Management:    Induction: Intravenous  PONV Risk Score and Plan:   Airway Management Planned: Oral ETT  and Tracheostomy  Additional Equipment:   Intra-op Plan:   Post-operative Plan: Post-operative intubation/ventilation  Informed Consent: I have reviewed the patients History and Physical, chart, labs and discussed the procedure including the risks, benefits and alternatives for the proposed anesthesia with the patient or authorized representative who has indicated his/her understanding and acceptance.   Dental Advisory Given  Plan Discussed with: Anesthesiologist, CRNA and Surgeon  Anesthesia Plan Comments: (Consent from daughter via phone at [phone]  Daughter consented for risks of anesthesia including but not limited to:  - adverse reactions to medications - damage to teeth, lips or other oral mucosa - sore throat or hoarseness - Damage to heart, brain, lungs or loss of life  Daughter voiced understanding.)        Anesthesia Quick Evaluation

## 2017-10-16 NOTE — Progress Notes (Signed)
Reportedly following commands on wake up assessment.  Did not tolerate SBT.  On my evaluation, sedated on fentanyl and midazolam  Vitals:   10/16/17 1419 10/16/17 1500 10/16/17 1600 10/16/17 1700  BP:  130/85 (!) 155/80 117/66  Pulse:  81 (!) 113 98  Resp:  10 13 10   Temp:   98.6 F (37 C)   TempSrc:   Bladder   SpO2: 96% 98% 95% 95%  Weight:      Height:        Intubated, sedated HEENT WNL except macroglossia No JVD Prolonged expiratory phase, no wheezes Regular, no M NABS, NT, mildly distended Extremities warm, no M Moves all extremities, no focal neurologic deficits  BMP Latest Ref Rng & Units 10/16/2017 10/15/2017 10/12/2017  Glucose 65 - 99 mg/dL 158(H) 182(H) 132(H)  BUN 6 - 20 mg/dL 35(H) 45(H) 35(H)  Creatinine 0.44 - 1.00 mg/dL 0.43(L) 0.51 0.39(L)  Sodium 135 - 145 mmol/L 137 136 138  Potassium 3.5 - 5.1 mmol/L 4.3 4.0 4.2  Chloride 101 - 111 mmol/L 98(L) 99(L) 100(L)  CO2 22 - 32 mmol/L 34(H) 33(H) 34(H)  Calcium 8.9 - 10.3 mg/dL 9.2 9.0 8.9    CBC Latest Ref Rng & Units 10/16/2017 10/15/2017 10/12/2017  WBC 3.6 - 11.0 K/uL 11.7(H) 8.3 10.6  Hemoglobin 12.0 - 16.0 g/dL 9.7(L) 9.0(L) 9.5(L)  Hematocrit 35.0 - 47.0 % 30.2(L) 27.8(L) 29.6(L)  Platelets 150 - 440 K/uL 203 191 221     Results for orders placed or performed during the hospital encounter of 09/30/17  CULTURE, BLOOD (ROUTINE X 2) w Reflex to ID Panel     Status: None   Collection Time: 09/30/17  5:43 PM  Result Value Ref Range Status   Specimen Description BLOOD CENTRAL LINE  Final   Special Requests   Final    BOTTLES DRAWN AEROBIC AND ANAEROBIC Blood Culture adequate volume   Culture   Final    NO GROWTH 5 DAYS Performed at [facility], [address] [place], [place] [address]    Report Status 10/05/2017 FINAL  Final  MRSA PCR Screening     Status: None   Collection Time: 09/30/17  8:02 PM  Result Value Ref Range Status   MRSA by PCR NEGATIVE NEGATIVE Final    Comment:        The  GeneXpert MRSA Assay (FDA approved for NASAL specimens only), is one component of a comprehensive MRSA colonization surveillance program. It is not intended to diagnose MRSA infection nor to guide or monitor treatment for MRSA infections. Performed at [facility], [address], [place], [place] [address]   Urine Culture     Status: None   Collection Time: 09/30/17  8:04 PM  Result Value Ref Range Status   Specimen Description   Final    URINE, RANDOM Performed at [facility], [address] [place], [place] [address]    Special Requests   Final    NONE Performed at [facility], [address], [place], [place] [address]    Culture   Final    NO GROWTH Performed at [facility], [place] [address] [place],  [address]    Report Status 10/02/2017 FINAL  Final  CULTURE, BLOOD (ROUTINE X 2) w Reflex to ID Panel     Status: None   Collection Time: 09/30/17  8:16 PM  Result Value Ref Range Status   Specimen Description BLOOD L HAND  Final  Special Requests   Final    BOTTLES DRAWN AEROBIC AND ANAEROBIC Blood Culture results may not be optimal due to an inadequate volume of blood received in culture bottles   Culture   Final    NO GROWTH 5 DAYS Performed at [facility], [address] [place], [place] [address]    Report Status 10/05/2017 FINAL  Final  Culture, respiratory (NON-Expectorated)     Status: None   Collection Time: 09/30/17  9:07 PM  Result Value Ref Range Status   Specimen Description   Final    TRACHEAL ASPIRATE Performed at [facility], [address] [place], [place] [address]    Special Requests   Final    NONE Performed at [facility], [address] [place], [place] [address]    Gram Stain   Final    MODERATE WBC PRESENT, PREDOMINANTLY PMN FEW GRAM POSITIVE COCCI IN PAIRS IN CLUSTERS FEW GRAM NEGATIVE RODS    Culture   Final    FEW Consistent with normal respiratory  flora. Performed at [facility], [place] [address], [place], [place] [address]    Report Status 10/03/2017 FINAL  Final  CULTURE, BLOOD (ROUTINE X 2) w Reflex to ID Panel     Status: None   Collection Time: 10/05/17 11:48 AM  Result Value Ref Range Status   Specimen Description BLOOD LAC  Final   Special Requests   Final    BOTTLES DRAWN AEROBIC AND ANAEROBIC Blood Culture adequate volume   Culture   Final    NO GROWTH 5 DAYS Performed at [facility], [address] [place], [place] [address]    Report Status 10/10/2017 FINAL  Final  CULTURE, BLOOD (ROUTINE X 2) w Reflex to ID Panel     Status: None   Collection Time: 10/05/17 11:55 AM  Result Value Ref Range Status   Specimen Description BLOOD LT HAND  Final   Special Requests   Final    BOTTLES DRAWN AEROBIC AND ANAEROBIC Blood Culture adequate volume   Culture   Final    NO GROWTH 5 DAYS Performed at [facility], [address] [place], [place] [address]    Report Status 10/10/2017 FINAL  Final  Culture, expectorated sputum-assessment     Status: None   Collection Time: 10/05/17  2:18 PM  Result Value Ref Range Status   Specimen Description TRACHEAL ASPIRATE  Final   Special Requests NONE  Final   Sputum evaluation   Final    THIS SPECIMEN IS ACCEPTABLE FOR SPUTUM CULTURE Performed at [facility], [address] [place], [place] [address]    Report Status 10/05/2017 FINAL  Final  Culture, respiratory (NON-Expectorated)     Status: None   Collection Time: 10/05/17  2:18 PM  Result Value Ref Range Status   Specimen Description   Final    TRACHEAL ASPIRATE Performed at [facility], [address] [place], [place] [address]    Special Requests   Final    NONE Reflexed from [id number] Performed at [facility], [address] [place], [place] [address]    Gram Stain   Final    MODERATE WBC PRESENT,BOTH PMN AND MONONUCLEAR NO SQUAMOUS EPITHELIAL CELLS SEEN FEW  YEAST WITH PSEUDOHYPHAE RARE GRAM POSITIVE COCCI IN CLUSTERS Performed at [facility], [place] [address] [place], [place] [address]    Culture FEW CANDIDA ALBICANS FEW CANDIDA DUBLINIENSIS   Final  Report Status 10/08/2017 FINAL  Final   Anti-infectives (From admission, onward)   Start     Dose/Rate Route Frequency Ordered Stop   10/09/17 1100  erythromycin ethylsuccinate (EES) 200 MG/5ML suspension 400 mg     400 mg Per Tube Every 8 hours 10/09/17 1028     10/07/17 0900  vancomycin (VANCOCIN) 500 mg in sodium chloride 0.9 % 100 mL IVPB  Status:  Discontinued     500 mg 100 mL/hr over 60 Minutes Intravenous Every 8 hours 10/07/17 0343 10/07/17 1033   10/06/17 0100  vancomycin (VANCOCIN) 500 mg in sodium chloride 0.9 % 100 mL IVPB  Status:  Discontinued     500 mg 100 mL/hr over 60 Minutes Intravenous Every 12 hours 10/05/17 1540 10/07/17 0343   10/05/17 1145  vancomycin (VANCOCIN) IVPB 1000 mg/200 mL premix     1,000 mg 200 mL/hr over 60 Minutes Intravenous  Once 10/05/17 1132 10/05/17 1401   10/01/17 2200  levofloxacin (LEVAQUIN) IVPB 750 mg  Status:  Discontinued     750 mg 100 mL/hr over 90 Minutes Intravenous Every 48 hours 10/01/17 2150 10/02/17 1054   10/01/17 1800  levofloxacin (LEVAQUIN) IVPB 500 mg  Status:  Discontinued     500 mg 100 mL/hr over 60 Minutes Intravenous Daily-1800 09/30/17 1853 10/01/17 0804   10/01/17 0900  piperacillin-tazobactam (ZOSYN) IVPB 3.375 g     3.375 g 12.5 mL/hr over 240 Minutes Intravenous Every 8 hours 10/01/17 0804 10/08/17 0545   09/30/17 1700  levofloxacin (LEVAQUIN) IVPB 750 mg     750 mg 100 mL/hr over 90 Minutes Intravenous  Once 09/30/17 1653 10/01/17 0819      CXR: Hyperinflation, hyperlucency.  No acute findings  IMPRESSION: Acute/chronic respiratory failure Very severe COPD with exacerbation Prolonged mechanical ventilation Macroglossia Intermittent agitation Hypotension, resolved   PLAN/REC: Cont vent support -  settings reviewed and/or adjusted Wean in PSV as tolerated Cont vent bundle Continue nebulized steroids and bronchodilators Continue systemic steroids at current dose Trach tube planned 5/16 Monitor BMET intermittently Monitor I/Os Correct electrolytes as indicated  DVT px: Enoxaparin (holding at midnight tonight for procedure) Monitor CBC intermittently Transfuse per usual guidelines Monitor temp, WBC count Micro and abx as above  Continue PAD protocol  After tracheostomy tube, will work to get her off of continuous sedation and begin aggressive physical therapy  She will likely need LTAC   Merton Border, MD PCCM service Mobile [phone] Pager [phone] 10/16/2017 6:37 PM

## 2017-10-16 NOTE — Progress Notes (Signed)
Consent for tracheostomy signed by pt's daughter, Remmi Armenteros, and placed in chart.

## 2017-10-17 ENCOUNTER — Encounter
Admission: EM | Disposition: A | Discharge status: Home Health | Payer: Self-pay | Source: Home / Self Care | Attending: Internal Medicine

## 2017-10-17 ENCOUNTER — Inpatient Hospital Stay: Payer: Medicaid Other | Admitting: Anesthesiology

## 2017-10-17 ENCOUNTER — Inpatient Hospital Stay: Payer: Medicaid Other

## 2017-10-17 ENCOUNTER — Encounter: Payer: Self-pay | Admitting: Certified Registered"

## 2017-10-17 HISTORY — PX: TRACHEOSTOMY TUBE PLACEMENT: SHX814

## 2017-10-17 LAB — GLUCOSE, CAPILLARY
GLUCOSE-CAPILLARY: 114 mg/dL — AB (ref 65–99)
Glucose-Capillary: 128 mg/dL — ABNORMAL HIGH (ref 65–99)
Glucose-Capillary: 106 mg/dL — ABNORMAL HIGH (ref 65–99)
Glucose-Capillary: 112 mg/dL — ABNORMAL HIGH (ref 65–99)
Glucose-Capillary: 154 mg/dL — ABNORMAL HIGH (ref 65–99)

## 2017-10-17 LAB — CBC
HEMATOCRIT: 26.3 % — AB (ref 35.0–47.0)
HEMOGLOBIN: 8.6 g/dL — AB (ref 12.0–16.0)
MCH: 29.6 pg (ref 26.0–34.0)
MCHC: 32.7 g/dL (ref 32.0–36.0)
MCV: 90.7 fL (ref 80.0–100.0)
Platelets: 175 10*3/uL (ref 150–440)
RBC: 2.9 MIL/uL — AB (ref 3.80–5.20)
RDW: 15.7 % — AB (ref 11.5–14.5)
WBC: 7.1 10*3/uL (ref 3.6–11.0)

## 2017-10-17 LAB — PROTIME-INR
INR: 0.94
Prothrombin Time: 12.5 seconds (ref 11.4–15.2)

## 2017-10-17 SURGERY — CREATION, TRACHEOSTOMY
Anesthesia: General

## 2017-10-17 MED ORDER — CLONAZEPAM 0.5 MG PO TABS: 0.5000 mg | ORAL_TABLET | Freq: Every day | ORAL | Status: DC

## 2017-10-17 MED ORDER — FAMOTIDINE 20 MG PO TABS
20.0000 mg | ORAL_TABLET | Freq: Every day | ORAL | Status: DC
  Administered 2017-10-18 – 2017-12-31 (×74): 20 mg
  Filled 2017-10-17 (×75): qty 1

## 2017-10-17 MED ORDER — DEXAMETHASONE SODIUM PHOSPHATE 10 MG/ML IJ SOLN
INTRAMUSCULAR | Status: AC
  Filled 2017-10-17: qty 1

## 2017-10-17 MED ORDER — RISPERIDONE 1 MG/ML PO SOLN
2.0000 mg | Freq: Every day | ORAL | Status: DC
  Administered 2017-10-18 – 2017-11-06 (×20): 2 mg
  Filled 2017-10-17 (×22): qty 2

## 2017-10-17 MED ORDER — FENTANYL CITRATE (PF) 100 MCG/2ML IJ SOLN
INTRAMUSCULAR | Status: AC
  Filled 2017-10-17: qty 2

## 2017-10-17 MED ORDER — ALBUTEROL SULFATE (2.5 MG/3ML) 0.083% IN NEBU
2.5000 mg | INHALATION_SOLUTION | RESPIRATORY_TRACT | Status: DC | PRN
  Administered 2017-10-17 – 2017-12-01 (×4): 2.5 mg via RESPIRATORY_TRACT
  Filled 2017-10-17 (×5): qty 3

## 2017-10-17 MED ORDER — GLYCOPYRROLATE 0.2 MG/ML IJ SOLN
INTRAMUSCULAR | Status: DC | PRN
  Administered 2017-10-17: 0.2 mg via INTRAVENOUS

## 2017-10-17 MED ORDER — PRO-STAT SUGAR FREE PO LIQD: 30.0000 mL | Freq: Two times a day (BID) | ORAL | Status: DC

## 2017-10-17 MED ORDER — LACTATED RINGERS IV SOLN
INTRAVENOUS | Status: DC | PRN
  Administered 2017-10-17: 11:00:00 via INTRAVENOUS

## 2017-10-17 MED ORDER — MIDAZOLAM HCL 2 MG/2ML IJ SOLN
INTRAMUSCULAR | Status: DC | PRN
  Administered 2017-10-17: 4 mg via INTRAVENOUS

## 2017-10-17 MED ORDER — ONDANSETRON HCL 4 MG/2ML IJ SOLN
INTRAMUSCULAR | Status: AC
  Filled 2017-10-17: qty 2

## 2017-10-17 MED ORDER — ONDANSETRON HCL 4 MG/2ML IJ SOLN
INTRAMUSCULAR | Status: DC | PRN
Start: 2017-10-17 — End: 2017-10-17
  Administered 2017-10-17: 4 mg via INTRAVENOUS

## 2017-10-17 MED ORDER — FENTANYL 75 MCG/HR TD PT72
75.0000 ug | MEDICATED_PATCH | TRANSDERMAL | Status: AC
  Administered 2017-10-17 – 2017-11-16 (×11): 75 ug via TRANSDERMAL
  Filled 2017-10-17 (×12): qty 1

## 2017-10-17 MED ORDER — PREDNISONE 20 MG PO TABS
40.0000 mg | ORAL_TABLET | Freq: Every day | ORAL | Status: DC
  Administered 2017-10-18 – 2017-10-22 (×5): 40 mg
  Filled 2017-10-17: qty 2
  Filled 2017-10-17: qty 4
  Filled 2017-10-17: qty 2
  Filled 2017-10-17 (×3): qty 4

## 2017-10-17 MED ORDER — DEXAMETHASONE SODIUM PHOSPHATE 10 MG/ML IJ SOLN
INTRAMUSCULAR | Status: DC | PRN
Start: 2017-10-17 — End: 2017-10-17
  Administered 2017-10-17: 10 mg via INTRAVENOUS

## 2017-10-17 MED ORDER — LIDOCAINE-EPINEPHRINE 1 %-1:100000 IJ SOLN
INTRAMUSCULAR | Status: DC | PRN
  Administered 2017-10-17: 1 mL

## 2017-10-17 MED ORDER — VITAL HIGH PROTEIN PO LIQD: 1000.0000 mL | ORAL | Status: DC

## 2017-10-17 MED ORDER — CLONAZEPAM 1 MG PO TABS: 1.0000 mg | ORAL_TABLET | Freq: Every day | ORAL | Status: DC

## 2017-10-17 MED ORDER — MIDAZOLAM HCL 2 MG/2ML IJ SOLN
INTRAMUSCULAR | Status: AC
  Filled 2017-10-17: qty 4

## 2017-10-17 SURGICAL SUPPLY — 31 items
BLADE SURG 15 STRL LF DISP TIS (BLADE) ×1 IMPLANT
BLADE SURG 15 STRL SS (BLADE) ×2
BLADE SURG SZ11 CARB STEEL (BLADE) ×3 IMPLANT
CANISTER SUCT 1200ML W/VALVE (MISCELLANEOUS) ×3 IMPLANT
CORD BIP STRL DISP 12FT (MISCELLANEOUS) ×3 IMPLANT
ELECT REM PT RETURN 9FT ADLT (ELECTROSURGICAL) ×3
ELECTRODE REM PT RTRN 9FT ADLT (ELECTROSURGICAL) ×1 IMPLANT
FORCEPS JEWEL BIP 4-3/4 STR (INSTRUMENTS) ×3 IMPLANT
GAUZE PACKING IODOFORM 1/2 (PACKING) IMPLANT
GLOVE BIO SURGEON STRL SZ7.5 (GLOVE) ×3 IMPLANT
GOWN STRL REUS W/ TWL LRG LVL3 (GOWN DISPOSABLE) ×2 IMPLANT
GOWN STRL REUS W/TWL LRG LVL3 (GOWN DISPOSABLE) ×4
HEMOSTAT SURGICEL 2X3 (HEMOSTASIS) ×3 IMPLANT
HLDR TRACH TUBE NECKBAND 18 (MISCELLANEOUS) ×1 IMPLANT
HOLDER TRACH TUBE NECKBAND 18 (MISCELLANEOUS) ×2
KIT TURNOVER KIT A (KITS) ×3 IMPLANT
LABEL OR SOLS (LABEL) ×3 IMPLANT
NS IRRIG 500ML POUR BTL (IV SOLUTION) ×3 IMPLANT
PACK HEAD/NECK (MISCELLANEOUS) ×3 IMPLANT
SHEARS HARMONIC 9CM CVD (BLADE) ×3 IMPLANT
SPONGE DRAIN TRACH 4X4 STRL 2S (GAUZE/BANDAGES/DRESSINGS) ×3 IMPLANT
SPONGE KITTNER 5P (MISCELLANEOUS) ×3 IMPLANT
SUCTION FRAZIER HANDLE 10FR (MISCELLANEOUS) ×2
SUCTION TUBE FRAZIER 10FR DISP (MISCELLANEOUS) ×1 IMPLANT
SUT SILK 2 0 (SUTURE) ×2
SUT SILK 2 0 SH (SUTURE) ×6 IMPLANT
SUT SILK 2-0 18XBRD TIE 12 (SUTURE) ×1 IMPLANT
SUT VIC AB 3-0 PS2 18 (SUTURE) ×3 IMPLANT
SYR 3ML LL SCALE MARK (SYRINGE) ×3 IMPLANT
TUBE TRACH SHILEY  6 DIST  CUF (TUBING) ×3 IMPLANT
TUBE TRACH SHILEY 8 DIST CUF (TUBING) ×3 IMPLANT

## 2017-10-17 NOTE — Care Management (Signed)
Planned trach placement today as family has decided. Patient remained vented/sedated currently. Process will determine patient's ability to wean from vent and then patient's ability to rehab. If patient remains on vent and unable to go home CSW will need to search for LTC bed to meet that need- this is not always available in [place] as these beds are limited. Another potential barrier, if patient needs vent/snf, will be Medicaid transfer to another state. MD updated.

## 2017-10-17 NOTE — Progress Notes (Signed)
RASS 0 despite fentanyl and midaz infusions. + F/C. Intermittent agitation.  Trach tube placed today  Vitals:   10/17/17 1745 10/17/17 1800 10/17/17 1808 10/17/17 1815  BP: 129/69 (!) 157/82  124/70  Pulse: (!) 122 (!) 130 (!) 140 (!) 131  Resp: 16 (!) 21 (!) 22 15  Temp: (!) 101.1 F (38.4 C) (!) 100.9 F (38.3 C) (!) 100.9 F (38.3 C) (!) 100.9 F (38.3 C)  TempSrc:  Bladder    SpO2: 95% 96% 95% 95%  Weight:      Height:        RASS 0 HEENT: NAD No JVD Prolonged expiratory phase, no wheezes anteriorly Tacyh, reg, no M Mildly distended, soft, diminished BS Ext warm, no edema No focal neurologic deficits  BMP Latest Ref Rng & Units 10/16/2017 10/15/2017 10/12/2017  Glucose 65 - 99 mg/dL 158(H) 182(H) 132(H)  BUN 6 - 20 mg/dL 35(H) 45(H) 35(H)  Creatinine 0.44 - 1.00 mg/dL 0.43(L) 0.51 0.39(L)  Sodium 135 - 145 mmol/L 137 136 138  Potassium 3.5 - 5.1 mmol/L 4.3 4.0 4.2  Chloride 101 - 111 mmol/L 98(L) 99(L) 100(L)  CO2 22 - 32 mmol/L 34(H) 33(H) 34(H)  Calcium 8.9 - 10.3 mg/dL 9.2 9.0 8.9    CBC Latest Ref Rng & Units 10/17/2017 10/16/2017 10/15/2017  WBC 3.6 - 11.0 K/uL 7.1 11.7(H) 8.3  Hemoglobin 12.0 - 16.0 g/dL 8.6(L) 9.7(L) 9.0(L)  Hematocrit 35.0 - 47.0 % 26.3(L) 30.2(L) 27.8(L)  Platelets 150 - 440 K/uL 175 203 191     Results for orders placed or performed during the hospital encounter of 09/30/17  CULTURE, BLOOD (ROUTINE X 2) w Reflex to ID Panel     Status: None   Collection Time: 09/30/17  5:43 PM  Result Value Ref Range Status   Specimen Description BLOOD CENTRAL LINE  Final   Special Requests   Final    BOTTLES DRAWN AEROBIC AND ANAEROBIC Blood Culture adequate volume   Culture   Final    NO GROWTH 5 DAYS Performed at [facility], [address] [place], [place] [address]    Report Status 10/05/2017 FINAL  Final  MRSA PCR Screening     Status: None   Collection Time: 09/30/17  8:02 PM  Result Value Ref Range Status   MRSA by PCR NEGATIVE  NEGATIVE Final    Comment:        The GeneXpert MRSA Assay (FDA approved for NASAL specimens only), is one component of a comprehensive MRSA colonization surveillance program. It is not intended to diagnose MRSA infection nor to guide or monitor treatment for MRSA infections. Performed at [facility], [address], [place], [place] [address]   Urine Culture     Status: None   Collection Time: 09/30/17  8:04 PM  Result Value Ref Range Status   Specimen Description   Final    URINE, RANDOM Performed at [facility], [address], [place], [place] [address]    Special Requests   Final    NONE Performed at [facility], [address] [place], [place] [address]    Culture   Final    NO GROWTH Performed at [facility], [place] [address], [place], [place] [address]    Report Status 10/02/2017 FINAL  Final  CULTURE, BLOOD (ROUTINE X 2) w Reflex to ID Panel     Status: None   Collection Time: 09/30/17  8:16 PM  Result Value Ref Range Status  Specimen Description BLOOD L HAND  Final   Special Requests   Final    BOTTLES DRAWN AEROBIC AND ANAEROBIC Blood Culture results may not be optimal due to an inadequate volume of blood received in culture bottles   Culture   Final    NO GROWTH 5 DAYS Performed at [facility], [address], [place], [place] [address]    Report Status 10/05/2017 FINAL  Final  Culture, respiratory (NON-Expectorated)     Status: None   Collection Time: 09/30/17  9:07 PM  Result Value Ref Range Status   Specimen Description   Final    TRACHEAL ASPIRATE Performed at [facility], [address] [place], [place] [address]    Special Requests   Final    NONE Performed at [facility], [address] [place], [place] [address]    Gram Stain   Final    MODERATE WBC PRESENT, PREDOMINANTLY PMN FEW GRAM POSITIVE COCCI IN PAIRS IN CLUSTERS FEW GRAM NEGATIVE RODS    Culture   Final     FEW Consistent with normal respiratory flora. Performed at [facility], [place] [address], [place], [place] [address]    Report Status 10/03/2017 FINAL  Final  CULTURE, BLOOD (ROUTINE X 2) w Reflex to ID Panel     Status: None   Collection Time: 10/05/17 11:48 AM  Result Value Ref Range Status   Specimen Description BLOOD LAC  Final   Special Requests   Final    BOTTLES DRAWN AEROBIC AND ANAEROBIC Blood Culture adequate volume   Culture   Final    NO GROWTH 5 DAYS Performed at [facility], [address] [place], [place] [address]    Report Status 10/10/2017 FINAL  Final  CULTURE, BLOOD (ROUTINE X 2) w Reflex to ID Panel     Status: None   Collection Time: 10/05/17 11:55 AM  Result Value Ref Range Status   Specimen Description BLOOD LT HAND  Final   Special Requests   Final    BOTTLES DRAWN AEROBIC AND ANAEROBIC Blood Culture adequate volume   Culture   Final    NO GROWTH 5 DAYS Performed at [facility], [address], [place], [place] [address]    Report Status 10/10/2017 FINAL  Final  Culture, expectorated sputum-assessment     Status: None   Collection Time: 10/05/17  2:18 PM  Result Value Ref Range Status   Specimen Description TRACHEAL ASPIRATE  Final   Special Requests NONE  Final   Sputum evaluation   Final    THIS SPECIMEN IS ACCEPTABLE FOR SPUTUM CULTURE Performed at [facility], [address], [place], [place] [address]    Report Status 10/05/2017 FINAL  Final  Culture, respiratory (NON-Expectorated)     Status: None   Collection Time: 10/05/17  2:18 PM  Result Value Ref Range Status   Specimen Description   Final    TRACHEAL ASPIRATE Performed at [facility], [address] [place], [place] [address]    Special Requests   Final    NONE Reflexed from [id number] Performed at [facility], [address] [place], [place] [address]    Gram Stain   Final    MODERATE WBC PRESENT,BOTH PMN AND  MONONUCLEAR NO SQUAMOUS EPITHELIAL CELLS SEEN FEW YEAST WITH PSEUDOHYPHAE RARE GRAM POSITIVE COCCI IN CLUSTERS Performed at [facility], [place] [address] [place], [place] [address]    Culture FEW CANDIDA  ALBICANS FEW CANDIDA DUBLINIENSIS   Final   Report Status 10/08/2017 FINAL  Final   Anti-infectives (From admission, onward)   Start     Dose/Rate Route Frequency Ordered Stop   10/09/17 1100  erythromycin ethylsuccinate (EES) 200 MG/5ML suspension 400 mg     400 mg Per Tube Every 8 hours 10/09/17 1028     10/07/17 0900  vancomycin (VANCOCIN) 500 mg in sodium chloride 0.9 % 100 mL IVPB  Status:  Discontinued     500 mg 100 mL/hr over 60 Minutes Intravenous Every 8 hours 10/07/17 0343 10/07/17 1033   10/06/17 0100  vancomycin (VANCOCIN) 500 mg in sodium chloride 0.9 % 100 mL IVPB  Status:  Discontinued     500 mg 100 mL/hr over 60 Minutes Intravenous Every 12 hours 10/05/17 1540 10/07/17 0343   10/05/17 1145  vancomycin (VANCOCIN) IVPB 1000 mg/200 mL premix     1,000 mg 200 mL/hr over 60 Minutes Intravenous  Once 10/05/17 1132 10/05/17 1401   10/01/17 2200  levofloxacin (LEVAQUIN) IVPB 750 mg  Status:  Discontinued     750 mg 100 mL/hr over 90 Minutes Intravenous Every 48 hours 10/01/17 2150 10/02/17 1054   10/01/17 1800  levofloxacin (LEVAQUIN) IVPB 500 mg  Status:  Discontinued     500 mg 100 mL/hr over 60 Minutes Intravenous Daily-1800 09/30/17 1853 10/01/17 0804   10/01/17 0900  piperacillin-tazobactam (ZOSYN) IVPB 3.375 g     3.375 g 12.5 mL/hr over 240 Minutes Intravenous Every 8 hours 10/01/17 0804 10/08/17 0545   09/30/17 1700  levofloxacin (LEVAQUIN) IVPB 750 mg     750 mg 100 mL/hr over 90 Minutes Intravenous  Once 09/30/17 1653 10/01/17 0819      CXR: Severe hyperinflation.  No acute findings, Trach tube in place EKG: sinus tachy with RBBB  IMPRESSION: Acute/chronic respiratory failure Very severe COPD with acute exacerbation Prolonged mechanical  ventilation Trach tube status - placed 05/16 Macroglossia, resolved Intermittent, severe agitation Tachyarrhythmias - looks like sinus tach on EKG Hypotension, resolved   PLAN/REC: Cont vent support - settings reviewed and/or adjusted Wean in PSV as tolerated Cont vent bundle Continue nebulized steroids and bronchodilators Continue systemic steroids at current dose (pred 40 mg daily) Monitor BMET intermittently Monitor I/Os Correct electrolytes as indicated  Resume TF protocol 05/16 DVT px: Enoxaparin (holding at midnight tonight for procedure) Monitor CBC intermittently Transfuse per usual guidelines Monitor temp, WBC count Micro and abx as above  Try to transition off of continuous infusions of fent and midaz to Duragesic, risperidone, alprazolam PT consult requested for 05/17   Merton Border, MD PCCM service Mobile [phone] Pager [phone] 10/17/2017 6:20 PM

## 2017-10-17 NOTE — Clinical Social Work Note (Signed)
Clinical Social Work Assessment  Patient Details  Name: Michelle [name] MRN: [id number] Date of Birth: 03/03/1961  Date of referral:  10/17/17               Reason for consult:  Facility Placement                Permission sought to share information with:  Facility Contact Representative(Permission by spouse; patient on a ventilator) Permission granted to share information::     Name::        Agency::     Relationship::     Contact Information:     Housing/Transportation Living arrangements for the past 2 months:  Single Family Home Source of Information:  Spouse, Siblings, Adult Children Patient Interpreter Needed:  None Criminal Activity/Legal Involvement Pertinent to Current Situation/Hospitalization:  No - Comment as needed Significant Relationships:  Spouse Lives with:  Spouse Do you feel safe going back to the place where you live?    Need for family participation in patient care:  Yes (Comment)  Care giving concerns:  Patient will require a vent/snf facility for discharge unless she is able to become higher in functioning and if family can support her, she may could go home. At this time we will pursue vent/snf.   Social Worker assessment / plan:  CSW contacted patient's sister, Michelle [name], who referred CSW to patient's daughter: Michelle [name]: [phone]. CSW contacted Michelle [name] and Michelle [name] explained that her father and patient's husband: Michelle [name]: [phone] is able to make decisions so CSW informed Michelle [name] that [place] would contact [place]. CSW did explain to both [place] the potential plan of vent/snf placement. CSW contacted Michelle [name] and explained vent/snf placement as well and that there is one facility in [place], [facility] but the other facilities are in [place] and [place]. Michelle [name] verbalized understanding and referral has been sent out.  Employment status:  Disabled (Comment on whether or not currently receiving Disability) Insurance information:  Medicaid In [place]  Recommendations:    Information / Referral to community resources:     Patient/Family's Response to care:  Patient's husband expressed appreciation for CSW call.  Patient/Family's Understanding of and Emotional Response to Diagnosis, Current Treatment, and Prognosis:  Patient's family verbalized understanding of vent/snf placement.  Emotional Assessment Appearance:  Appears stated age Attitude/Demeanor/Rapport:  (on ventilator) Affect (typically observed):  (on ventilator) Orientation:  (patient on ventilator) Alcohol / Substance use:  Illicit Drugs Psych involvement (Current and /or in the community):     Discharge Needs  Concerns to be addressed:  Substance Abuse Concerns, Other (Comment Required(vent/snf placement) Readmission within the last 30 days:  No Current discharge risk:  None Barriers to Discharge:  Vent Bed not available   Shela Leff, LCSW 10/17/2017, 4:09 PM

## 2017-10-17 NOTE — Transfer of Care (Signed)
Immediate Anesthesia Transfer of Care Note  Patient: Michelle [name]  Procedure(s) Performed: TRACHEOSTOMY (N/A )  Patient Location: ICU  Anesthesia Type:General  Level of Consciousness: drowsy and patient cooperative  Airway & Oxygen Therapy: Patient connected to tracheostomy mask oxygen and Patient placed on Ventilator (see vital sign flow sheet for setting)  Post-op Assessment: Report given to RN and Post -op Vital signs reviewed and stable  Post vital signs: Reviewed and stable  Last Vitals:  Vitals Value Taken Time  BP 140/90 10/17/2017 12:15 PM  Temp 36.5 C 10/17/2017 12:15 PM  Pulse 109 10/17/2017 12:15 PM  Resp 8 10/17/2017 12:15 PM  SpO2 100 % 10/17/2017 12:15 PM    Last Pain:  Vitals:   10/17/17 1215  TempSrc: Temporal  PainSc: Asleep         Complications: No apparent anesthesia complications

## 2017-10-17 NOTE — Anesthesia Post-op Follow-up Note (Signed)
Anesthesia QCDR form completed.        

## 2017-10-17 NOTE — Consult Note (Signed)
Size 6 cuffed shiley placed.  Ok to removed betadine gauze on 10/18/2017.  Ok to remove sutures on POD#5.

## 2017-10-17 NOTE — Op Note (Signed)
..  10/17/2017 12:03 PM  Joanne Chars [id number]  Pre-Op Dx: respiratory failure  Post-Op Dx:  same  Proc:  Tracheostomy  Surg:  Briannah Lona  Anes:  GOT  EBL:  <62ml  Comp:  none  Findings:  Size 6 cuffed shiley placed between tracheal ring 2 and 3  Procedure:  The patient was brought from the intensive care unit to the operating room and transferred to an operating table.  Anesthesia was administered per indwelling orotracheal tube.   Neck extension was achieved as possible anda shoulder rolke was placed.  The lower neck was palpated with the findings as described above.  1% Xylocaine with 1:100,000 epinephrine, 8 cc's, was infiltrated into the surgical field for intraoperative hemostasis.  Several minutes were allowed for this to take effect. The patient was prepped in a sterile fashion with a surgical prep from the chin down to the upper chest.  Sterile draping was accomplished in the standard fashion.  A  5 cm horizontal incision was made sharply a finger's breadth above the sternal notch, and extended through skin and subcutaneous fat.  Using cautery, the superficial layer of the deep cervical fascia was lysed.  Additional dissection revealed the strap muscles.  The midline raphe was divided in two layers and the muscles retracted laterally.  The pretracheal plane was visualized.  This was entered bluntly.  The thyroid isthmus was isolated and divided with the Harmonic scalpel.  The thyroid gland was retracted to either side.  The anterior face of the trachea was cleared.  In the  6 interspace, a transverse incision was made between cartilage rings into the tracheal lumen.  A 1cm wide inferiorly based flap was generated and secured to the lower wound with a 4-0 chromic suture.   A previously tested  # 6 Shiley cuffed tracheostomy tube was brought into the field.  With the endotracheal tube under direct visualization through the tracheostomy, it was gently backed up.  The tracheostomy  tube was inserted into the tracheal lumen.  Hemostasis was observed. The cuff was inflated and observed to be intact and containing pressure. The inner cannula was placed and ventilation assumed per tracheostomy tube.  Good chest wall motion was observed, and CO2 was documented per anesthesia.  The trach tube was secured in the standard fashion with trach ties. A 2-0 Nylon suture was used to secure the trach tube to the skin on both sides.  Hemostasis was observed again.  When satisfactory ventilation was assured, the orotracheal tube was removed.  At this point the procedure was completed.  The patient was returned to anesthesia, awakened as possible, and transferred back to the intensive care unit in stable condition.  Comment: 57 y.o. female with prolonged ventilation was the indication for today's procedure.  Anticipate a routine postoperative recovery including standard tracheal hygiene.  The sutures should be removed in 5 days.  When the patient no longer requires ventilator or pressure support, the cuff should be deflated.  Changing to an uncuffed tube and downsizing will be according to the clinical condition of the patient.   Edell Mesenbrink  12:03 PM 10/17/2017 3

## 2017-10-17 NOTE — Progress Notes (Signed)
[facility] [place] NAME: Michelle [name]    MR#:  [id number]  DATE OF BIRTH:  1961-01-13  SUBJECTIVE:  CHIEF COMPLAINT:   Chief Complaint  Patient presents with  . Shortness of Breath   - Failed SBT-    Intencivist was planning to give a trial again. Plan for trach today.  REVIEW OF SYSTEMS:  Review of Systems  Unable to perform ROS: Critical illness    DRUG ALLERGIES:   Allergies  Allergen Reactions  . Carvedilol Other (See Comments)    Pt gets "sick" when on medication    VITALS:  Blood pressure (!) 153/85, pulse (!) 130, temperature (!) 100.9 F (38.3 C), resp. rate (!) 21, height 4\' 9"  (1.448 m), weight 56.4 kg (124 lb 5.4 oz), last menstrual period 03/06/2005, SpO2 92 %.  PHYSICAL EXAMINATION:  Physical Exam  GENERAL:  57 y.o.-year-old patient lying in the bed with no acute distress.  EYES: Pupils equal, round, reactive to light and accommodation. No scleral icterus.  HEENT: Head atraumatic, normocephalic. Oropharynx and nasopharynx clear.orally intubated and has OG tube  NECK:  Supple, no jugular venous distention. No thyroid enlargement, no tenderness.  LUNGS: Normal breath sounds bilaterally, no wheezing, rales,rhonchi or crepitation. No use of accessory muscles of respiration. Decreased bibasilar breath sounds CARDIOVASCULAR: S1, S2 normal. No murmurs, rubs, or gallops.  ABDOMEN: Soft, nontender, nondistended.  Hypoactive bowel sounds present. No organomegaly or mass.  EXTREMITIES: No cyanosis, or clubbing. 1-2+ edema of hands and feet edema NEUROLOGIC: intubated and sedated.   PSYCHIATRIC: The patient is sedated.  SKIN: No obvious rash, lesion, or ulcer.    LABORATORY PANEL:   CBC Recent Labs  Lab 10/17/17 0618  WBC 7.1  HGB 8.6*  HCT 26.3*  PLT 175   ------------------------------------------------------------------------------------------------------------------  Chemistries  Recent Labs  Lab  10/12/17 0522  10/16/17 0548  NA 138   < > 137  K 4.2   < > 4.3  CL 100*   < > 98*  CO2 34*   < > 34*  GLUCOSE 132*   < > 158*  BUN 35*   < > 35*  CREATININE 0.39*   < > 0.43*  CALCIUM 8.9   < > 9.2  MG 2.0  --   --   AST 26  --   --   ALT 47  --   --   ALKPHOS 34*  --   --   BILITOT 0.4  --   --    < > = values in this interval not displayed.   ------------------------------------------------------------------------------------------------------------------  Cardiac Enzymes No results for input(s): TROPONINI in the last 168 hours. ------------------------------------------------------------------------------------------------------------------  RADIOLOGY:  Dg Abd 1 View  Result Date: 10/17/2017 CLINICAL DATA:  Orogastric tube placement. EXAM: ABDOMEN - 1 VIEW COMPARISON:  Abdominal radiograph Oct 16, 2017 none FINDINGS: Orogastric tube tip projects in mid stomach, advanced from prior radiograph. Included bowel gas pattern is nondilated and nonobstructive. No intra-abdominal mass effect or pathologic calcifications. Surgical clips project in the included pelvis. Soft tissue planes and included osseous structures are unchanged. IMPRESSION: Advanced orogastric tube tip projects in mid stomach. Electronically Signed   By: Michelle [name] M.D.   On: 10/17/2017 14:10   Dg Abd 1 View  Result Date: 10/16/2017 CLINICAL DATA:  NG tube placement EXAM: ABDOMEN - 1 VIEW COMPARISON:  10/06/2017 FINDINGS: Esophageal tube tip and side port overlie the proximal stomach. Decreased gaseous enlargement of stomach. Mild  air-filled colon in the upper abdomen. IMPRESSION: Esophageal tube tip and side port overlie the proximal stomach Electronically Signed   By: Michelle [name] M.D.   On: 10/16/2017 19:32   Dg Chest Port 1 View  Result Date: 10/17/2017 CLINICAL DATA:  Increasing shortness of breath today EXAM: PORTABLE CHEST 1 VIEW COMPARISON:  10/16/2017 FINDINGS: Cardiac shadow is within normal limits.  Tracheostomy tube and right-sided PICC line are noted in satisfactory position. The nasogastric catheter is also noted extending into the stomach. The lungs are hyperinflated consistent with COPD. No focal infiltrate or sizable effusion is seen. IMPRESSION: COPD without acute abnormality. Tubes and lines as described. Electronically Signed   By: Michelle [name] M.D.   On: 10/17/2017 15:57   Dg Chest Port 1 View  Result Date: 10/16/2017 CLINICAL DATA:  Respiratory failure. EXAM: PORTABLE CHEST 1 VIEW COMPARISON:  Radiograph of Oct 14, 2017. FINDINGS: Endotracheal and nasogastric tubes are unchanged in position. Right-sided PICC line is unchanged in position. No pneumothorax or pleural effusion is noted. No acute pulmonary disease is noted. Bony thorax is unremarkable. IMPRESSION: Stable support apparatus. No acute cardiopulmonary abnormality seen. Electronically Signed   By: Marijo Conception, M.D.   On: 10/16/2017 07:22    EKG:   Orders placed or performed during the hospital encounter of 09/30/17  . EKG 12-Lead  . EKG 12-Lead  . EKG 12-Lead  . EKG 12-Lead    ASSESSMENT AND PLAN:   [name] a57 y.o.femalewith a known history of anxiety, CHF, cocaine abuse, COPD, coronary artery disease, hepatitis C, hypertension admitted for acute respiratory failure and intubated.  1.  Acute on chronic respiratory failure with hypoxia-secondary to acute on chronic COPD exacerbation -Remains on vent, continue weaning trial as tolerated -Appreciate pulmonary management  -On steroids, nebs.  Finished Zosyn for bronchitis/pneumonia -Vancomycin was added for fevers- but discontinued now- no further fevers-  -Wean sedation and spontaneous breathing trial tried multiple days. - Needs to discuss about possibility of Trach with family if unable to be weaned off, as been on vent for  >10 days- ENT consult placed. Trach planned today.  2.  Hypotension-secondary to sedation medications. Low-dose Levophed.  3.   Anxiety-continue Xanax scheduled.  Also on propofol, fentanyl drips and risperidone  4.  DVT prophylaxis-subcutaneous heparin  Discussed with ICU attending  All the records are reviewed and case discussed with Care Management/Social Workerr. Management plans discussed with the patient, family and they are in agreement.  CODE STATUS: Full Code  TOTAL TIME TAKING CARE OF THIS PATIENT: 31 minutes.   POSSIBLE D/C IN ? DAYS, DEPENDING ON CLINICAL CONDITION.   [name] M.D on 10/17/2017 at 5:29 PM  Between 7am to 6pm - Pager - [phone]  After 6pm go to [email or web address] - password [email or web address] [facility]  Office  [phone]  CC: Primary care physician; Center, [facility]

## 2017-10-18 LAB — URINALYSIS, COMPLETE (UACMP) WITH MICROSCOPIC
Bilirubin Urine: NEGATIVE
GLUCOSE, UA: NEGATIVE mg/dL
KETONES UR: NEGATIVE mg/dL
LEUKOCYTES UA: NEGATIVE
Nitrite: NEGATIVE
PROTEIN: NEGATIVE mg/dL
RBC / HPF: >50 RBC/hpf — ABNORMAL HIGH (ref 0–5)
Specific Gravity, Urine: 1.024 (ref 1.005–1.030)
pH: 5 (ref 5.0–8.0)

## 2017-10-18 LAB — GLUCOSE, CAPILLARY
GLUCOSE-CAPILLARY: 154 mg/dL — AB (ref 65–99)
GLUCOSE-CAPILLARY: 176 mg/dL — AB (ref 65–99)
GLUCOSE-CAPILLARY: 117 mg/dL — AB (ref 65–99)
GLUCOSE-CAPILLARY: 140 mg/dL — AB (ref 65–99)
Glucose-Capillary: 117 mg/dL — ABNORMAL HIGH (ref 65–99)
Glucose-Capillary: 138 mg/dL — ABNORMAL HIGH (ref 65–99)
Glucose-Capillary: 149 mg/dL — ABNORMAL HIGH (ref 65–99)

## 2017-10-18 MED ORDER — MORPHINE SULFATE (PF) 2 MG/ML IV SOLN
1.0000 mg | INTRAVENOUS | Status: DC | PRN
  Administered 2017-10-18 – 2017-10-19 (×3): 2 mg via INTRAVENOUS
  Filled 2017-10-18 (×3): qty 1

## 2017-10-18 MED ORDER — FENTANYL 2500MCG IN NS 250ML (10MCG/ML) PREMIX INFUSION
0.0000 ug/h | INTRAVENOUS | Status: DC
  Administered 2017-10-18: 100 ug/h via INTRAVENOUS
  Administered 2017-10-18: 150 ug/h via INTRAVENOUS
  Administered 2017-10-19: 200 ug/h via INTRAVENOUS
  Administered 2017-10-20: 75 ug/h via INTRAVENOUS
  Filled 2017-10-18 (×3): qty 250

## 2017-10-18 MED ORDER — NOREPINEPHRINE 4 MG/250ML-% IV SOLN: 0.0000 ug/min | INTRAVENOUS | Status: DC

## 2017-10-18 MED ORDER — BISACODYL 10 MG RE SUPP
10.0000 mg | Freq: Once | RECTAL | Status: AC
  Administered 2017-10-18: 10 mg via RECTAL
  Filled 2017-10-18: qty 1

## 2017-10-18 NOTE — Progress Notes (Signed)
Temp 99 - 100.8 today. Sputum cx, urine cx, and blood cx done today. Pt has wanted her sedation to be decreased. Currently at versed 1mg /min and fentanyl at 46mcg. She has been calm. On scheduled xanax and fentanyl patch.. Nods yes/no to questions. Trying to participate with PT today. Extreme weakness of all extremities. When fully awake her respirations look labored but she has not been tachypneic. Sats good on 30%.

## 2017-10-18 NOTE — Progress Notes (Signed)
RASS 0 despite fentanyl and midaz infusions. + F/C. Intermittent agitation.  Trach tube placed yesterday  Vitals:   10/18/17 0700 10/18/17 0730 10/18/17 0800 10/18/17 0830  BP: 121/71 135/77 118/69 121/79  Pulse: (!) 109 (!) 115 (!) 107 (!) 102  Resp: 18 20 17 13   Temp: (!) 100.8 F (38.2 C) (!) 100.8 F (38.2 C) (!) 100.8 F (38.2 C) (!) 100.4 F (38 C)  TempSrc:      SpO2: 97% 97% 98% 99%  Weight:      Height:        RASS 0 HEENT: NAD No JVD Prolonged expiratory phase, no wheezes anteriorly Tacyh, reg, no M Mildly distended, soft, diminished BS Ext warm, no edema No focal neurologic deficits  BMP Latest Ref Rng & Units 10/16/2017 10/15/2017 10/12/2017  Glucose 65 - 99 mg/dL 158(H) 182(H) 132(H)  BUN 6 - 20 mg/dL 35(H) 45(H) 35(H)  Creatinine 0.44 - 1.00 mg/dL 0.43(L) 0.51 0.39(L)  Sodium 135 - 145 mmol/L 137 136 138  Potassium 3.5 - 5.1 mmol/L 4.3 4.0 4.2  Chloride 101 - 111 mmol/L 98(L) 99(L) 100(L)  CO2 22 - 32 mmol/L 34(H) 33(H) 34(H)  Calcium 8.9 - 10.3 mg/dL 9.2 9.0 8.9    CBC Latest Ref Rng & Units 10/17/2017 10/16/2017 10/15/2017  WBC 3.6 - 11.0 K/uL 7.1 11.7(H) 8.3  Hemoglobin 12.0 - 16.0 g/dL 8.6(L) 9.7(L) 9.0(L)  Hematocrit 35.0 - 47.0 % 26.3(L) 30.2(L) 27.8(L)  Platelets 150 - 440 K/uL 175 203 191     Results for orders placed or performed during the hospital encounter of 09/30/17  CULTURE, BLOOD (ROUTINE X 2) w Reflex to ID Panel     Status: None   Collection Time: 09/30/17  5:43 PM  Result Value Ref Range Status   Specimen Description BLOOD CENTRAL LINE  Final   Special Requests   Final    BOTTLES DRAWN AEROBIC AND ANAEROBIC Blood Culture adequate volume   Culture   Final    NO GROWTH 5 DAYS Performed at [facility], [address] [place], [place] [address]    Report Status 10/05/2017 FINAL  Final  MRSA PCR Screening     Status: None   Collection Time: 09/30/17  8:02 PM  Result Value Ref Range Status   MRSA by PCR NEGATIVE NEGATIVE  Final    Comment:        The GeneXpert MRSA Assay (FDA approved for NASAL specimens only), is one component of a comprehensive MRSA colonization surveillance program. It is not intended to diagnose MRSA infection nor to guide or monitor treatment for MRSA infections. Performed at [facility], [address], [place], [place] [address]   Urine Culture     Status: None   Collection Time: 09/30/17  8:04 PM  Result Value Ref Range Status   Specimen Description   Final    URINE, RANDOM Performed at [facility], [address], [place], [place] [address]    Special Requests   Final    NONE Performed at [facility], [address], [place], [place] [address]    Culture   Final    NO GROWTH Performed at [facility], [place] [address] [place], [place] [address]    Report Status 10/02/2017 FINAL  Final  CULTURE, BLOOD (ROUTINE X 2) w Reflex to ID Panel     Status: None   Collection Time: 09/30/17  8:16 PM  Result Value Ref Range Status   Specimen  Description BLOOD L HAND  Final   Special Requests   Final    BOTTLES DRAWN AEROBIC AND ANAEROBIC Blood Culture results may not be optimal due to an inadequate volume of blood received in culture bottles   Culture   Final    NO GROWTH 5 DAYS Performed at [facility], [address] [place], [place] [address]    Report Status 10/05/2017 FINAL  Final  Culture, respiratory (NON-Expectorated)     Status: None   Collection Time: 09/30/17  9:07 PM  Result Value Ref Range Status   Specimen Description   Final    TRACHEAL ASPIRATE Performed at [facility], [address], [place], [place] [address]    Special Requests   Final    NONE Performed at [facility], [address] [place], [place] [address]    Gram Stain   Final    MODERATE WBC PRESENT, PREDOMINANTLY PMN FEW GRAM POSITIVE COCCI IN PAIRS IN CLUSTERS FEW GRAM NEGATIVE RODS    Culture   Final    FEW  Consistent with normal respiratory flora. Performed at [facility], [place] [address] [place], [place] [address]    Report Status 10/03/2017 FINAL  Final  CULTURE, BLOOD (ROUTINE X 2) w Reflex to ID Panel     Status: None   Collection Time: 10/05/17 11:48 AM  Result Value Ref Range Status   Specimen Description BLOOD LAC  Final   Special Requests   Final    BOTTLES DRAWN AEROBIC AND ANAEROBIC Blood Culture adequate volume   Culture   Final    NO GROWTH 5 DAYS Performed at [facility], [address] [place], [place] [address]    Report Status 10/10/2017 FINAL  Final  CULTURE, BLOOD (ROUTINE X 2) w Reflex to ID Panel     Status: None   Collection Time: 10/05/17 11:55 AM  Result Value Ref Range Status   Specimen Description BLOOD LT HAND  Final   Special Requests   Final    BOTTLES DRAWN AEROBIC AND ANAEROBIC Blood Culture adequate volume   Culture   Final    NO GROWTH 5 DAYS Performed at [facility] [address] [place], [place] [address]    Report Status 10/10/2017 FINAL  Final  Culture, expectorated sputum-assessment     Status: None   Collection Time: 10/05/17  2:18 PM  Result Value Ref Range Status   Specimen Description TRACHEAL ASPIRATE  Final   Special Requests NONE  Final   Sputum evaluation   Final    THIS SPECIMEN IS ACCEPTABLE FOR SPUTUM CULTURE Performed at [facility], [address] [place], [place] [address]    Report Status 10/05/2017 FINAL  Final  Culture, respiratory (NON-Expectorated)     Status: None   Collection Time: 10/05/17  2:18 PM  Result Value Ref Range Status   Specimen Description   Final    TRACHEAL ASPIRATE Performed at [facility], [address] [place], [place] [address]    Special Requests   Final    NONE Reflexed from [id number] Performed at [facility], [address] [place], [place] [address]    Gram Stain   Final    MODERATE WBC PRESENT,BOTH PMN AND MONONUCLEAR NO  SQUAMOUS EPITHELIAL CELLS SEEN FEW YEAST WITH PSEUDOHYPHAE RARE GRAM POSITIVE COCCI IN CLUSTERS Performed at [facility], [place] [address] [place], [place] [address]    Culture FEW CANDIDA ALBICANS  FEW CANDIDA DUBLINIENSIS   Final   Report Status 10/08/2017 FINAL  Final   Anti-infectives (From admission, onward)   Start     Dose/Rate Route Frequency Ordered Stop   10/09/17 1100  erythromycin ethylsuccinate (EES) 200 MG/5ML suspension 400 mg     400 mg Per Tube Every 8 hours 10/09/17 1028     10/07/17 0900  vancomycin (VANCOCIN) 500 mg in sodium chloride 0.9 % 100 mL IVPB  Status:  Discontinued     500 mg 100 mL/hr over 60 Minutes Intravenous Every 8 hours 10/07/17 0343 10/07/17 1033   10/06/17 0100  vancomycin (VANCOCIN) 500 mg in sodium chloride 0.9 % 100 mL IVPB  Status:  Discontinued     500 mg 100 mL/hr over 60 Minutes Intravenous Every 12 hours 10/05/17 1540 10/07/17 0343   10/05/17 1145  vancomycin (VANCOCIN) IVPB 1000 mg/200 mL premix     1,000 mg 200 mL/hr over 60 Minutes Intravenous  Once 10/05/17 1132 10/05/17 1401   10/01/17 2200  levofloxacin (LEVAQUIN) IVPB 750 mg  Status:  Discontinued     750 mg 100 mL/hr over 90 Minutes Intravenous Every 48 hours 10/01/17 2150 10/02/17 1054   10/01/17 1800  levofloxacin (LEVAQUIN) IVPB 500 mg  Status:  Discontinued     500 mg 100 mL/hr over 60 Minutes Intravenous Daily-1800 09/30/17 1853 10/01/17 0804   10/01/17 0900  piperacillin-tazobactam (ZOSYN) IVPB 3.375 g     3.375 g 12.5 mL/hr over 240 Minutes Intravenous Every 8 hours 10/01/17 0804 10/08/17 0545   09/30/17 1700  levofloxacin (LEVAQUIN) IVPB 750 mg     750 mg 100 mL/hr over 90 Minutes Intravenous  Once 09/30/17 1653 10/01/17 0819      CXR: Severe hyperinflation.  No acute findings, Trach tube in place EKG: sinus tachy with RBBB  IMPRESSION: Acute/chronic respiratory failure Very severe COPD with acute exacerbation Prolonged mechanical ventilation Trach tube  status - placed 05/16 Intermittent, severe agitation  PLAN/REC: Cont vent support - settings reviewed and/or adjusted Wean in PSV as tolerated Cont vent bundle Continue nebulized steroids and bronchodilators Continue systemic steroids at current dose (pred 40 mg daily) Monitor BMET intermittently Monitor I/Os Correct electrolytes as indicated  Resume TF protocol 05/16 DVT px: Enoxaparin (holding at midnight tonight for procedure) Monitor CBC intermittently Transfuse per usual guidelines Monitor temp, WBC count Micro and abx as above  Try to transition off of continuous infusions of fent and midaz to Duragesic, risperidone, alprazolam PT consult requested for 05/17  [name], DO

## 2017-10-18 NOTE — Progress Notes (Signed)
Pharmacy Consult:  Pharmacy consulted to assist with constipation management for 57 yo female admitted with COPD exacerbation requiring mechanical ventilation and sedation with fentanyl and midazolam drips.   Plan: 5/17 Last bowel movement 5/14. Continue senna/docusate 2 tabs VT BID. Continue erythromycin 400mg  VT Q8hr. Will order Bisacodyl Supp x 1 today.   Pharmacy will continue to monitor and adjust per consult.   Pernell Dupre, PharmD, BCPS Clinical Pharmacist 10/18/2017 11:12 AM

## 2017-10-18 NOTE — Anesthesia Postprocedure Evaluation (Addendum)
Anesthesia Post Note  Patient: [name]  Procedure(s) Performed: TRACHEOSTOMY (N/A )  Patient location during evaluation: ICU Anesthesia Type: General Level of consciousness: sedated Pain management: pain level controlled Vital Signs Assessment: post-procedure vital signs reviewed and stable Respiratory status: respiratory function stable and patient on ventilator - see flowsheet for VS Cardiovascular status: blood pressure returned to baseline and stable Postop Assessment: no apparent nausea or vomiting Anesthetic complications: no     Last Vitals:  Vitals:   10/18/17 0630 10/18/17 0700  BP: 134/85 121/71  Pulse: (!) 106 (!) 109  Resp: 19 18  Temp: (!) 38 C (!) 38.2 C  SpO2: 96% 97%    Last Pain:  Vitals:   10/18/17 0400  TempSrc: Bladder  PainSc:                  Michelle [name]

## 2017-10-18 NOTE — Evaluation (Signed)
Physical Therapy Evaluation Patient Details Name: Michelle [name] MRN: [id number] DOB: [birth year] Today's Date: 10/18/2017   History of Present Illness  Pt is a 57 y/o F who presented with severe SOB with COPD exacerbation.  Chest x-ray was clear, patient was slightly hypotensive after intubation, admitted to the ICU.  Trach tube placed 5/16.  Pt's PMH includes cocaine abuse, COPD, CAD, hepatitis C, CHF.      Clinical Impression  Pt admitted with above diagnosis. Pt currently with functional limitations due to the deficits listed below (see PT Problem List). Evaluation very limited due to pt's lethargy. HR 120 at rest and up to 129 with PROM exercises.  Pt able to answer yes/no questions appropriately.  Unsure of pt's baseline; however with PT evaluation on 06/30/17 the pt was able to ambulate 40 ft without AD with min guard assist. PROM exercises for BUE and BLE as well as light distal to proximal massage to Bil fingers and hands to decrease swelling performing this date. Anticipate that once pt is completing off of sedating medication she will be able to participate more with therapy and make gains with her mobility goals.  Given pt's current mobility status, recommending SNF at d/c.  Pt will benefit from skilled PT to increase their independence and safety with mobility to allow discharge to the venue listed below.      Follow Up Recommendations SNF(LTACH not an option per chart review)    Equipment Recommendations  Other (comment)(TBD as pt progresses)    Recommendations for Other Services       Precautions / Restrictions Precautions Precautions: Fall;Other (comment) Precaution Comments: trach, NG tube, PICC line Restrictions Weight Bearing Restrictions: No      Mobility  Bed Mobility               General bed mobility comments: Unable to attempt this session due to weakness and lethargy  Transfers                    Ambulation/Gait                Stairs             Wheelchair Mobility    Modified Rankin (Stroke Patients Only)       Balance                                             Pertinent Vitals/Pain Pain Assessment: No/denies pain Faces Pain Scale: No hurt(no signs of pain) Pain Intervention(s): Monitored during session    Home Living                   Additional Comments: Pt on trach and remains very lethargic, deferred home living and PLOF information until pt able to provide information.     Prior Function           Comments: Pt on trach and remains very lethargic, deferred home living and PLOF information until pt able to provide information.  With PT evaluation on 06/30/17 the pt was able to ambulate 40 ft without AD with min guard assist.     Hand Dominance        Extremity/Trunk Assessment   Upper Extremity Assessment Upper Extremity Assessment: RUE deficits/detail;LUE deficits/detail RUE Deficits / Details: Pt presents lethargic, still weaning from sedation.  No movement appreciated.  Swelling Bil  hands and fingers.   LUE Deficits / Details: Pt presents lethargic, still weaning from sedation.  Only movement appreciated was middle and 4th finger slight movement with cues.  Swelling Bil hands and fingers.      Lower Extremity Assessment Lower Extremity Assessment: RLE deficits/detail;LLE deficits/detail RLE Deficits / Details: No movement appreciated. LLE Deficits / Details: No movement appreciated.       Communication   Communication: Tracheostomy(and lethargic due to continued sedation)  Cognition Arousal/Alertness: Lethargic(pt still weaning from sedating medication) Behavior During Therapy: Flat affect Overall Cognitive Status: Difficult to assess                                 General Comments: Pt able to answer yes/no questions appropriately.  She wiggles 2 fingers on L hand with cues and is able to open eyes slighlty on command.        General  Comments General comments (skin integrity, edema, etc.): HR 120 at rest and up to 129 with PROM exercises    Exercises Other Exercises Other Exercises: BUE (all x10) : composite digit F and E, elbow F (not on R due to PICC line) and E, shoulder ER and IR, wrist F E ulnar and radial deviation Other Exercises: Gentle distal to proximal massage to fingers and hands Bil for decreased swelling.  Propped hands up higher on additional pillow at end of session.  Other Exercises: BLE (all x10): hip ER and IR, heel slides, hip abduction, SLR, DF and PF, ankle circles clockwise and counterclockwise   Assessment/Plan    PT Assessment Patient needs continued PT services  PT Problem List Decreased strength;Decreased range of motion;Decreased activity tolerance;Decreased balance;Decreased mobility;Decreased coordination;Decreased knowledge of use of DME;Decreased safety awareness;Decreased knowledge of precautions;Cardiopulmonary status limiting activity       PT Treatment Interventions DME instruction;Gait training;Stair training;Functional mobility training;Therapeutic activities;Therapeutic exercise;Balance training;Neuromuscular re-education;Patient/family education;Manual techniques;Wheelchair mobility training;Modalities;Cognitive remediation    PT Goals (Current goals can be found in the Care Plan section)  Acute Rehab PT Goals Patient Stated Goal: unable to state PT Goal Formulation: Patient unable to participate in goal setting Time For Goal Achievement: 11/01/17 Potential to Achieve Goals: Fair    Frequency Min 2X/week   Barriers to discharge Other (comment) Unsure of the amount of assist available at d/c    Co-evaluation               AM-PAC PT "6 Clicks" Daily Activity  Outcome Measure Difficulty turning over in bed (including adjusting bedclothes, sheets and blankets)?: Unable Difficulty moving from lying on back to sitting on the side of the bed? : Unable Difficulty sitting  down on and standing up from a chair with arms (e.g., wheelchair, bedside commode, etc,.)?: Unable Help needed moving to and from a bed to chair (including a wheelchair)?: Total Help needed walking in hospital room?: Total Help needed climbing 3-5 steps with a railing? : Total 6 Click Score: 6    End of Session   Activity Tolerance: Patient limited by lethargy;Treatment limited secondary to medical complications (Comment)(elevated HR) Patient left: in bed;with call bell/phone within reach;Other (comment)(with prevalon boots in place) Nurse Communication: Mobility status;Other (comment)(vitals) PT Visit Diagnosis: Muscle weakness (generalized) (M62.81);Difficulty in walking, not elsewhere classified (R26.2)    Time: 2694-8546 PT Time Calculation (min) (ACUTE ONLY): 19 min   Charges:   PT Evaluation $PT Eval High Complexity: 1 High     PT G  Codes:        [name] PT, DPT 10/18/2017, 4:30 PM

## 2017-10-18 NOTE — Progress Notes (Signed)
[place] [facility] [place] NAME: Michelle [name]    MR#:  [id number]  DATE OF BIRTH:  November 27, 1960  SUBJECTIVE:  CHIEF COMPLAINT:   Chief Complaint  Patient presents with  . Shortness of Breath   - Failed SBT-   - Trach tube placed 10/17/17. - had intermittent low grade fever.  REVIEW OF SYSTEMS:  Review of Systems  Unable to perform ROS: Critical illness    DRUG ALLERGIES:   Allergies  Allergen Reactions  . Carvedilol Other (See Comments)    Pt gets "sick" when on medication    VITALS:  Blood pressure (!) 141/82, pulse (!) 113, temperature 100.2 F (37.9 C), resp. rate 18, height 4\' 9"  (1.448 m), weight 57 kg (125 lb 10.6 oz), last menstrual period 03/06/2005, SpO2 96 %.  PHYSICAL EXAMINATION:  Physical Exam  GENERAL:  57 y.o.-year-old patient lying in the bed with no acute distress.  EYES: Pupils equal, round, reactive to light and accommodation. No scleral icterus.  HEENT: Head atraumatic, normocephalic. Oropharynx and nasopharynx clear.orally intubated and has OG tube  NECK:  Supple, no jugular venous distention. No thyroid enlargement, no tenderness.  LUNGS: Normal breath sounds bilaterally, no wheezing, rales,rhonchi or crepitation. No use of accessory muscles of respiration. Decreased bibasilar breath sounds CARDIOVASCULAR: S1, S2 normal. No murmurs, rubs, or gallops.  ABDOMEN: Soft, nontender, nondistended.  Hypoactive bowel sounds present. No organomegaly or mass.  EXTREMITIES: No cyanosis, or clubbing. 1-2+ edema of hands and feet edema NEUROLOGIC: intubated and sedated.   PSYCHIATRIC: The patient is sedated.  SKIN: No obvious rash, lesion, or ulcer.    LABORATORY PANEL:   CBC Recent Labs  Lab 10/17/17 0618  WBC 7.1  HGB 8.6*  HCT 26.3*  PLT 175   ------------------------------------------------------------------------------------------------------------------  Chemistries  Recent Labs  Lab 10/12/17 0522   10/16/17 0548  NA 138   < > 137  K 4.2   < > 4.3  CL 100*   < > 98*  CO2 34*   < > 34*  GLUCOSE 132*   < > 158*  BUN 35*   < > 35*  CREATININE 0.39*   < > 0.43*  CALCIUM 8.9   < > 9.2  MG 2.0  --   --   AST 26  --   --   ALT 47  --   --   ALKPHOS 34*  --   --   BILITOT 0.4  --   --    < > = values in this interval not displayed.   ------------------------------------------------------------------------------------------------------------------  Cardiac Enzymes No results for input(s): TROPONINI in the last 168 hours. ------------------------------------------------------------------------------------------------------------------  RADIOLOGY:  Dg Abd 1 View  Result Date: 10/17/2017 CLINICAL DATA:  Orogastric tube placement. EXAM: ABDOMEN - 1 VIEW COMPARISON:  Abdominal radiograph Oct 16, 2017 none FINDINGS: Orogastric tube tip projects in mid stomach, advanced from prior radiograph. Included bowel gas pattern is nondilated and nonobstructive. No intra-abdominal mass effect or pathologic calcifications. Surgical clips project in the included pelvis. Soft tissue planes and included osseous structures are unchanged. IMPRESSION: Advanced orogastric tube tip projects in mid stomach. Electronically Signed   By: Elon Alas M.D.   On: 10/17/2017 14:10   Dg Chest Port 1 View  Result Date: 10/17/2017 CLINICAL DATA:  Increasing shortness of breath today EXAM: PORTABLE CHEST 1 VIEW COMPARISON:  10/16/2017 FINDINGS: Cardiac shadow is within normal limits. Tracheostomy tube and right-sided PICC line are noted in satisfactory position.  The nasogastric catheter is also noted extending into the stomach. The lungs are hyperinflated consistent with COPD. No focal infiltrate or sizable effusion is seen. IMPRESSION: COPD without acute abnormality. Tubes and lines as described. Electronically Signed   By: Inez Catalina M.D.   On: 10/17/2017 15:57    EKG:   Orders placed or performed during the  hospital encounter of 09/30/17  . EKG 12-Lead  . EKG 12-Lead  . EKG 12-Lead  . EKG 12-Lead  . EKG 12-Lead  . EKG 12-Lead  . EKG 12-Lead  . EKG 12-Lead    ASSESSMENT AND PLAN:   [name] a57 y.o.femalewith a known history of anxiety, CHF, cocaine abuse, COPD, coronary artery disease, hepatitis C, hypertension admitted for acute respiratory failure and intubated.  1.  Acute on chronic respiratory failure with hypoxia-secondary to acute on chronic COPD exacerbation -Remains on vent, continue weaning trial as tolerated -Appreciate pulmonary management  -On steroids, nebs.  Finished Zosyn for bronchitis/pneumonia -Wean sedation and spontaneous breathing trial tried multiple days. - Needs to discuss about possibility of Trach with family if unable to be weaned off, as been on vent for  >10 days- ENT consult placed. Trach placed 10/17/17.  2.  Hypotension-secondary to sedation medications. Low-dose Levophed.  3.  Anxiety-continue Xanax scheduled.  Also on propofol, fentanyl drips and risperidone  4.  DVT prophylaxis-subcutaneous heparin  5. Low grade fever   Check Xray, UA and blood cx.  Discussed with ICU attending  All the records are reviewed and case discussed with Care Management/Social Workerr. Management plans discussed with the patient, family and they are in agreement.  CODE STATUS: Full Code  TOTAL TIME TAKING CARE OF THIS PATIENT: 31 minutes.   POSSIBLE D/C IN ? DAYS, DEPENDING ON CLINICAL CONDITION.   [name] M.D on 10/18/2017 at 11:11 PM  Between 7am to 6pm - Pager - [phone]  After 6pm go to [email or web address] - password [email or web address] [facility]  Office  [phone]  CC: Primary care physician; Center, [facility]

## 2017-10-19 ENCOUNTER — Inpatient Hospital Stay: Payer: Medicaid Other

## 2017-10-19 LAB — BASIC METABOLIC PANEL
Anion gap: 5 (ref 5–15)
BUN: 31 mg/dL — ABNORMAL HIGH (ref 6–20)
CO2: 30 mmol/L (ref 22–32)
Calcium: 8.1 mg/dL — ABNORMAL LOW (ref 8.9–10.3)
Chloride: 100 mmol/L — ABNORMAL LOW (ref 101–111)
Creatinine, Ser: 0.38 mg/dL — ABNORMAL LOW (ref 0.44–1.00)
GFR calc Af Amer: >60 mL/min (ref >60)
GFR calc non Af Amer: >60 mL/min (ref >60)
Glucose, Bld: 130 mg/dL — ABNORMAL HIGH (ref 65–99)
Potassium: 3.9 mmol/L (ref 3.5–5.1)
Sodium: 135 mmol/L (ref 135–145)

## 2017-10-19 LAB — BLOOD CULTURE ID PANEL (REFLEXED)
Acinetobacter baumannii: NOT DETECTED
CANDIDA KRUSEI: NOT DETECTED
CANDIDA TROPICALIS: NOT DETECTED
Candida albicans: NOT DETECTED
Candida glabrata: NOT DETECTED
Candida parapsilosis: NOT DETECTED
ENTEROCOCCUS SPECIES: NOT DETECTED
ESCHERICHIA COLI: NOT DETECTED
Enterobacter cloacae complex: NOT DETECTED
Enterobacteriaceae species: NOT DETECTED
Haemophilus influenzae: NOT DETECTED
Klebsiella oxytoca: NOT DETECTED
Klebsiella pneumoniae: NOT DETECTED
LISTERIA MONOCYTOGENES: NOT DETECTED
Methicillin resistance: DETECTED — AB
NEISSERIA MENINGITIDIS: NOT DETECTED
PROTEUS SPECIES: NOT DETECTED
Pseudomonas aeruginosa: NOT DETECTED
STAPHYLOCOCCUS AUREUS BCID: NOT DETECTED
STREPTOCOCCUS AGALACTIAE: NOT DETECTED
Serratia marcescens: NOT DETECTED
Staphylococcus species: DETECTED — AB
Streptococcus pneumoniae: NOT DETECTED
Streptococcus pyogenes: NOT DETECTED
Streptococcus species: NOT DETECTED

## 2017-10-19 LAB — CBC
HCT: 28.8 % — ABNORMAL LOW (ref 35.0–47.0)
Hemoglobin: 9.2 g/dL — ABNORMAL LOW (ref 12.0–16.0)
MCH: 29.1 pg (ref 26.0–34.0)
MCHC: 32 g/dL (ref 32.0–36.0)
MCV: 90.9 fL (ref 80.0–100.0)
PLATELETS: 162 10*3/uL (ref 150–440)
RBC: 3.17 MIL/uL — ABNORMAL LOW (ref 3.80–5.20)
RDW: 16.1 % — AB (ref 11.5–14.5)
WBC: 7 10*3/uL (ref 3.6–11.0)

## 2017-10-19 LAB — GLUCOSE, CAPILLARY
GLUCOSE-CAPILLARY: 123 mg/dL — AB (ref 65–99)
Glucose-Capillary: 131 mg/dL — ABNORMAL HIGH (ref 65–99)
Glucose-Capillary: 131 mg/dL — ABNORMAL HIGH (ref 65–99)
Glucose-Capillary: 130 mg/dL — ABNORMAL HIGH (ref 65–99)
Glucose-Capillary: 166 mg/dL — ABNORMAL HIGH (ref 65–99)

## 2017-10-19 MED ORDER — METOPROLOL TARTRATE 5 MG/5ML IV SOLN
2.5000 mg | Freq: Once | INTRAVENOUS | Status: AC
  Administered 2017-10-19: 2.5 mg via INTRAVENOUS
  Filled 2017-10-19: qty 5

## 2017-10-19 MED ORDER — VANCOMYCIN HCL IN DEXTROSE 1-5 GM/200ML-% IV SOLN
1000.0000 mg | Freq: Once | INTRAVENOUS | Status: AC
  Administered 2017-10-19: 1000 mg via INTRAVENOUS
  Filled 2017-10-19: qty 200

## 2017-10-19 MED ORDER — KETOROLAC TROMETHAMINE 15 MG/ML IJ SOLN
15.0000 mg | Freq: Four times a day (QID) | INTRAMUSCULAR | Status: AC | PRN
  Administered 2017-10-19 – 2017-10-22 (×3): 15 mg via INTRAVENOUS
  Filled 2017-10-19 (×3): qty 1

## 2017-10-19 MED ORDER — VANCOMYCIN HCL 500 MG IV SOLR
500.0000 mg | Freq: Two times a day (BID) | INTRAVENOUS | Status: DC
  Administered 2017-10-19 – 2017-10-21 (×4): 500 mg via INTRAVENOUS
  Filled 2017-10-19 (×5): qty 500

## 2017-10-19 NOTE — Progress Notes (Signed)
Resumed previous PRVC settings due to increased patient WOB.

## 2017-10-19 NOTE — Progress Notes (Addendum)
[place] at [place] NAME: Michelle [name]    MR#:  [id number]  DATE OF BIRTH:  [birth year]  SUBJECTIVE:  Patient seen and evaluated today On tracheostomy with ventilator support Received xanax this am and lethargic Had fever this am  - Trach tube placed 10/17/17.   REVIEW OF SYSTEMS:  Review of Systems  Unable to perform ROS: Critical illness    DRUG ALLERGIES:   Allergies  Allergen Reactions  . Carvedilol Other (See Comments)    Pt gets "sick" when on medication    VITALS:  Blood pressure (!) 144/80, pulse (!) 112, temperature (!) 100.4 F (38 C), resp. rate 18, height 4\' 9"  (1.448 m), weight 59.9 kg (132 lb 0.9 oz), last menstrual period 03/06/2005, SpO2 97 %.  PHYSICAL EXAMINATION:  Physical Exam  GENERAL:  57 y.o.-year-old patient lying in the bed with ventilator support EYES: Pupils equal, round, reactive to light and accommodation. No scleral icterus.  HEENT: Head atraumatic, normocephalic. Oropharynx and nasopharynx clear. has OG tube  NECK:  Supple, no jugular venous distention. No thyroid enlargement, no tendHrness.  Has tracheostomy LUNGS: Normal breath sounds bilaterally, no wheezing, rales,rhonchi or crepitation. No use of accessory muscles of respiration. Decreased bibasilar breath sounds Vent setting  Tidal volume 450 Rate 15 Peep 5 Fi02 30% CARDIOVASCULAR: S1, S2 normal. No murmurs, rubs, or gallops.  ABDOMEN: Soft, nontender, nondistended.  Hypoactive bowel sounds present. No organomegaly or mass.  EXTREMITIES: No cyanosis, or clubbing. 1-2+ edema of hands and feet edema NEUROLOGIC: Awake, responds to verbal commands Not completely oriented to time, place and person PSYCHIATRIC: Unable to assess completely SKIN: No obvious rash, lesion, or ulcer.    LABORATORY PANEL:   CBC Recent Labs  Lab 10/19/17 0544  WBC 7.0  HGB 9.2*  HCT 28.8*  PLT 162    ------------------------------------------------------------------------------------------------------------------  Chemistries  Recent Labs  Lab 10/19/17 0544  NA 135  K 3.9  CL 100*  CO2 30  GLUCOSE 130*  BUN 31*  CREATININE 0.38*  CALCIUM 8.1*   ------------------------------------------------------------------------------------------------------------------  Cardiac Enzymes No results for input(s): TROPONINI in the last 168 hours. ------------------------------------------------------------------------------------------------------------------  RADIOLOGY:  Dg Abd 1 View  Result Date: 10/17/2017 CLINICAL DATA:  Orogastric tube placement. EXAM: ABDOMEN - 1 VIEW COMPARISON:  Abdominal radiograph Oct 16, 2017 none FINDINGS: Orogastric tube tip projects in mid stomach, advanced from prior radiograph. Included bowel gas pattern is nondilated and nonobstructive. No intra-abdominal mass effect or pathologic calcifications. Surgical clips project in the included pelvis. Soft tissue planes and included osseous structures are unchanged. IMPRESSION: Advanced orogastric tube tip projects in mid stomach. Electronically Signed   By: Elon Alas M.D.   On: 10/17/2017 14:10   Dg Chest Port 1 View  Result Date: 10/19/2017 CLINICAL DATA:  Mechanically assisted ventilation EXAM: PORTABLE CHEST 1 VIEW COMPARISON:  Two days ago FINDINGS: Tracheostomy tube is well seated. An orogastric tube reaches the stomach. Right upper extremity PICC with tip at the SVC level. Large lung volumes. There is no edema, consolidation, effusion, or pneumothorax. IMPRESSION: 1. Stable positioning of tubes and central line. 2. COPD.  No acute superimposed finding. Electronically Signed   By: Monte Fantasia M.D.   On: 10/19/2017 09:43   Dg Chest Port 1 View  Result Date: 10/17/2017 CLINICAL DATA:  Increasing shortness of breath today EXAM: PORTABLE CHEST 1 VIEW COMPARISON:  10/16/2017 FINDINGS: Cardiac shadow is  within normal limits. Tracheostomy tube and right-sided PICC line are  noted in satisfactory position. The nasogastric catheter is also noted extending into the stomach. The lungs are hyperinflated consistent with COPD. No focal infiltrate or sizable effusion is seen. IMPRESSION: COPD without acute abnormality. Tubes and lines as described. Electronically Signed   By: Inez Catalina M.D.   On: 10/17/2017 15:57    EKG:   Orders placed or performed during the hospital encounter of 09/30/17  . EKG 12-Lead  . EKG 12-Lead  . EKG 12-Lead  . EKG 12-Lead  . EKG 12-Lead  . EKG 12-Lead  . EKG 12-Lead  . EKG 12-Lead    ASSESSMENT AND PLAN:   [name] a57 y.o.femalewith a known history of anxiety, CHF, cocaine abuse, COPD, coronary artery disease, hepatitis C, hypertension admitted for acute respiratory failure and intubated.  1.  Acute on chronic respiratory failure with hypoxia-secondary to acute on chronic COPD exacerbation\ -On tracheostomy with ventilator support -Continue Vent bundle -Appreciate pulmonary management  -On steroids, nebs.  On IV zosyn ABX - off sedation drip  2.  Hypotension improved Off pressor meds  3.  Anxiety-continue Xanax scheduled.   4.  DVT prophylaxis-subcutaneous heparin  5. Low grade fever   Check Xray, UA and blood cx.  All the records are reviewed and case discussed with Care Management/Social Workerr. Management plans discussed with the patient, family and they are in agreement.  CODE STATUS: Full Code  TOTAL TIME TAKING CARE OF THIS PATIENT: 34 minutes.   POSSIBLE D/C IN ? DAYS, DEPENDING ON CLINICAL CONDITION.   Saundra Shelling M.D on 10/19/2017 at 10:47 AM  Between 7am to 6pm - Pager - [phone]  After 6pm go to [email or web address] - password [email or web address] [facility]  Office  [phone]  CC: Primary care physician; Center, [facility]

## 2017-10-19 NOTE — Progress Notes (Signed)
Patient had periods of being uncomfortable and indicated she was in pain early in the shift. Had to have Versed bolus, iv Toradol when BP was low, IV  Morphine when his blood pressure went back up along with an IV metoprolol to control hypertension and tachycardia.

## 2017-10-19 NOTE — Consult Note (Signed)
Pharmacy Antibiotic Note  Michelle [name] is a 57 y.o. female admitted on 09/30/2017 with bacteremia.  Pharmacy has been consulted for vancomycin dosing. Bcx grew 1/4 anerobic bottle staph species mecA positive.   Plan: After disccusion with Dr. Jefferson Fuel we agree that this is most likely a contaminate, but since the pt has been spiking fevers for the last several days we will repeat bcx and start vanc until the repeat cx come back. Vancomycin 1g once, then 500mg  q 12 hours- no stacked dosing Trough prior to the 5th dose  Height: 4\' 9"  (144.8 cm) Weight: 132 lb 0.9 oz (59.9 kg) IBW/kg (Calculated) : 38.6  Temp (24hrs), Avg:100.1 F (37.8 C), Min:98.4 F (36.9 C), Max:100.8 F (38.2 C)  Recent Labs  Lab 10/15/17 0444 10/16/17 0548 10/17/17 0618 10/19/17 0544  WBC 8.3 11.7* 7.1 7.0  CREATININE 0.51 0.43*  --  0.38*    Estimated Creatinine Clearance: 58.4 mL/min (A) (by C-G formula based on SCr of 0.38 mg/dL (L)).    Allergies  Allergen Reactions  . Carvedilol Other (See Comments)    Pt gets "sick" when on medication     Microbiology results: 5/17 BCx: 1/4 staph spec mec A positive 4/29 UCx: NG  5/4 Sputum: few yeast   MRSA PCR: neg  Thank you for allowing pharmacy to be a part of this patient's care.  Ramond Dial, Pharm.D, BCPS Clinical Pharmacist  10/19/2017 7:31 AM

## 2017-10-19 NOTE — Progress Notes (Signed)
Patient more alert, changed her back to PSV 14/5 and 30%

## 2017-10-19 NOTE — Progress Notes (Signed)
Patient returned to previous PRVC settings due to excessive sleepiness secondary to medication and going apneic.

## 2017-10-19 NOTE — Progress Notes (Signed)
Over the last 24 hours sedation has been decreased. Patient is more responsive. Didn't awakening this morning. Still requiring a fair amount of ventilatory support. We'll try to decrease pressure as tolerated  Vitals:   10/19/17 0715 10/19/17 0721 10/19/17 0725 10/19/17 0817  BP: (!) 68/41 (!) 79/53 119/73   Pulse: 91 90 95   Resp: 14 16 15    Temp: 100 F (37.8 C) 100 F (37.8 C) 99.7 F (37.6 C)   TempSrc:      SpO2: 95% 91% 96% 97%  Weight:      Height:        RASS 0 HEENT: NAD No JVD Prolonged expiratory phase, no wheezes anteriorly Tacyh, reg, no M Mildly distended, soft, diminished BS Ext warm, no edema No focal neurologic deficits  BMP Latest Ref Rng & Units 10/19/2017 10/16/2017 10/15/2017  Glucose 65 - 99 mg/dL 130(H) 158(H) 182(H)  BUN 6 - 20 mg/dL 31(H) 35(H) 45(H)  Creatinine 0.44 - 1.00 mg/dL 0.38(L) 0.43(L) 0.51  Sodium 135 - 145 mmol/L 135 137 136  Potassium 3.5 - 5.1 mmol/L 3.9 4.3 4.0  Chloride 101 - 111 mmol/L 100(L) 98(L) 99(L)  CO2 22 - 32 mmol/L 30 34(H) 33(H)  Calcium 8.9 - 10.3 mg/dL 8.1(L) 9.2 9.0    CBC Latest Ref Rng & Units 10/19/2017 10/17/2017 10/16/2017  WBC 3.6 - 11.0 K/uL 7.0 7.1 11.7(H)  Hemoglobin 12.0 - 16.0 g/dL 9.2(L) 8.6(L) 9.7(L)  Hematocrit 35.0 - 47.0 % 28.8(L) 26.3(L) 30.2(L)  Platelets 150 - 440 K/uL 162 175 203     Results for orders placed or performed during the hospital encounter of 09/30/17  CULTURE, BLOOD (ROUTINE X 2) w Reflex to ID Panel     Status: None   Collection Time: 09/30/17  5:43 PM  Result Value Ref Range Status   Specimen Description BLOOD CENTRAL LINE  Final   Special Requests   Final    BOTTLES DRAWN AEROBIC AND ANAEROBIC Blood Culture adequate volume   Culture   Final    NO GROWTH 5 DAYS Performed at [facility], [address] [place], [place] [address]    Report Status 10/05/2017 FINAL  Final  MRSA PCR Screening     Status: None   Collection Time: 09/30/17  8:02 PM  Result Value Ref Range  Status   MRSA by PCR NEGATIVE NEGATIVE Final    Comment:        The GeneXpert MRSA Assay (FDA approved for NASAL specimens only), is one component of a comprehensive MRSA colonization surveillance program. It is not intended to diagnose MRSA infection nor to guide or monitor treatment for MRSA infections. Performed at [facility], [address], [place], [place] [address]   Urine Culture     Status: None   Collection Time: 09/30/17  8:04 PM  Result Value Ref Range Status   Specimen Description   Final    URINE, RANDOM Performed at [facility], [address], [place], [place] [address]    Special Requests   Final    NONE Performed at [facility], [address] [place], [place] [address]    Culture   Final    NO GROWTH Performed at [facility], [place] [address], [place], [place] [address]    Report Status 10/02/2017 FINAL  Final  CULTURE, BLOOD (ROUTINE X 2) w Reflex to ID Panel     Status: None   Collection Time: 09/30/17  8:16 PM  Result  Value Ref Range Status   Specimen Description BLOOD L HAND  Final   Special Requests   Final    BOTTLES DRAWN AEROBIC AND ANAEROBIC Blood Culture results may not be optimal due to an inadequate volume of blood received in culture bottles   Culture   Final    NO GROWTH 5 DAYS Performed at [facility], [address], [place], [place] [address]    Report Status 10/05/2017 FINAL  Final  Culture, respiratory (NON-Expectorated)     Status: None   Collection Time: 09/30/17  9:07 PM  Result Value Ref Range Status   Specimen Description   Final    TRACHEAL ASPIRATE Performed at [facility], [address] [place], [place] [address]    Special Requests   Final    NONE Performed at [facility], [address] [place], [place] [address]    Gram Stain   Final    MODERATE WBC PRESENT, PREDOMINANTLY PMN FEW GRAM POSITIVE COCCI IN PAIRS IN CLUSTERS FEW GRAM NEGATIVE  RODS    Culture   Final    FEW Consistent with normal respiratory flora. Performed at [facility], [place] [address], [place], [place] [address]    Report Status 10/03/2017 FINAL  Final  CULTURE, BLOOD (ROUTINE X 2) w Reflex to ID Panel     Status: None   Collection Time: 10/05/17 11:48 AM  Result Value Ref Range Status   Specimen Description BLOOD LAC  Final   Special Requests   Final    BOTTLES DRAWN AEROBIC AND ANAEROBIC Blood Culture adequate volume   Culture   Final    NO GROWTH 5 DAYS Performed at [facility], [address] [place], [place] [address]    Report Status 10/10/2017 FINAL  Final  CULTURE, BLOOD (ROUTINE X 2) w Reflex to ID Panel     Status: None   Collection Time: 10/05/17 11:55 AM  Result Value Ref Range Status   Specimen Description BLOOD LT HAND  Final   Special Requests   Final    BOTTLES DRAWN AEROBIC AND ANAEROBIC Blood Culture adequate volume   Culture   Final    NO GROWTH 5 DAYS Performed at [facility], [address] [place], [place] [address]    Report Status 10/10/2017 FINAL  Final  Culture, expectorated sputum-assessment     Status: None   Collection Time: 10/05/17  2:18 PM  Result Value Ref Range Status   Specimen Description TRACHEAL ASPIRATE  Final   Special Requests NONE  Final   Sputum evaluation   Final    THIS SPECIMEN IS ACCEPTABLE FOR SPUTUM CULTURE Performed at [facility], [address] [place], [place] [address]    Report Status 10/05/2017 FINAL  Final  Culture, respiratory (NON-Expectorated)     Status: None   Collection Time: 10/05/17  2:18 PM  Result Value Ref Range Status   Specimen Description   Final    TRACHEAL ASPIRATE Performed at [facility], [address] [place], [place] [address]    Special Requests   Final    NONE Reflexed from [id number] Performed at [facility], [address] [place], [place] [address]    Gram Stain   Final    MODERATE WBC  PRESENT,BOTH PMN AND MONONUCLEAR NO SQUAMOUS EPITHELIAL CELLS SEEN FEW YEAST WITH PSEUDOHYPHAE RARE GRAM POSITIVE COCCI IN CLUSTERS Performed at [facility], [place] [address] [place], [place] [address]  Culture FEW CANDIDA ALBICANS FEW CANDIDA DUBLINIENSIS   Final   Report Status 10/08/2017 FINAL  Final  CULTURE, BLOOD (ROUTINE X 2) w Reflex to ID Panel     Status: None (Preliminary result)   Collection Time: 10/18/17 10:51 AM  Result Value Ref Range Status   Specimen Description BLOOD LEFT ANTECUBITAL  Final   Special Requests   Final    BOTTLES DRAWN AEROBIC AND ANAEROBIC Blood Culture adequate volume   Culture  Setup Time   Final    Organism ID to follow GRAM POSITIVE COCCI ANAEROBIC BOTTLE ONLY CRITICAL RESULT CALLED TO, READ BACK BY AND VERIFIED WITH: MELISSA [place] 10/19/17 @ 1027  [place] Performed at [facility], [address] [place], [place] [address]    Culture GRAM POSITIVE COCCI  Final   Report Status PENDING  Incomplete  Blood Culture ID Panel (Reflexed)     Status: Abnormal   Collection Time: 10/18/17 10:51 AM  Result Value Ref Range Status   Enterococcus species NOT DETECTED NOT DETECTED Final   Listeria monocytogenes NOT DETECTED NOT DETECTED Final   Staphylococcus species DETECTED (A) NOT DETECTED Final    Comment: Methicillin (oxacillin) resistant coagulase negative staphylococcus. Possible blood culture contaminant (unless isolated from more than one blood culture draw or clinical case suggests pathogenicity). No antibiotic treatment is indicated for blood  culture contaminants. CRITICAL RESULT CALLED TO, READ BACK BY AND VERIFIED WITH: MELISSA [place] 10/19/17 @ 4403  [place]    Staphylococcus aureus NOT DETECTED NOT DETECTED Final   Methicillin resistance DETECTED (A) NOT DETECTED Final    Comment: CRITICAL RESULT CALLED TO, READ BACK BY AND VERIFIED WITH: MELISSA [place] 10/19/17 @ 4742  [place]    Streptococcus species NOT DETECTED NOT DETECTED  Final   Streptococcus agalactiae NOT DETECTED NOT DETECTED Final   Streptococcus pneumoniae NOT DETECTED NOT DETECTED Final   Streptococcus pyogenes NOT DETECTED NOT DETECTED Final   Acinetobacter baumannii NOT DETECTED NOT DETECTED Final   Enterobacteriaceae species NOT DETECTED NOT DETECTED Final   Enterobacter cloacae complex NOT DETECTED NOT DETECTED Final   Escherichia coli NOT DETECTED NOT DETECTED Final   Klebsiella oxytoca NOT DETECTED NOT DETECTED Final   Klebsiella pneumoniae NOT DETECTED NOT DETECTED Final   Proteus species NOT DETECTED NOT DETECTED Final   Serratia marcescens NOT DETECTED NOT DETECTED Final   Haemophilus influenzae NOT DETECTED NOT DETECTED Final   Neisseria meningitidis NOT DETECTED NOT DETECTED Final   Pseudomonas aeruginosa NOT DETECTED NOT DETECTED Final   Candida albicans NOT DETECTED NOT DETECTED Final   Candida glabrata NOT DETECTED NOT DETECTED Final   Candida krusei NOT DETECTED NOT DETECTED Final   Candida parapsilosis NOT DETECTED NOT DETECTED Final   Candida tropicalis NOT DETECTED NOT DETECTED Final    Comment: Performed at [facility], [address] [place], [place] [address]  Culture, respiratory (NON-Expectorated)     Status: None (Preliminary result)   Collection Time: 10/18/17 11:09 AM  Result Value Ref Range Status   Specimen Description   Final    TRACHEAL ASPIRATE Performed at [facility], [address] [place], [place] [address]    Special Requests   Final    NONE Performed at [facility], [place], [place] [address]    Gram Stain   Final    MODERATE WBC PRESENT, PREDOMINANTLY PMN NO ORGANISMS SEEN Performed at [facility], [address], [place], [place] [address]    Culture  PENDING  Incomplete   Report Status PENDING  Incomplete  CULTURE, BLOOD (ROUTINE X 2) w Reflex to ID Panel     Status: None (Preliminary result)   Collection Time: 10/18/17  4:09 PM  Result  Value Ref Range Status   Specimen Description BLOOD L FA  Final   Special Requests   Final    BOTTLES DRAWN AEROBIC AND ANAEROBIC Blood Culture adequate volume   Culture   Final    NO GROWTH < 24 HOURS Performed at [facility], [address], [place], [place] [address]    Report Status PENDING  Incomplete   Anti-infectives (From admission, onward)   Start     Dose/Rate Route Frequency Ordered Stop   10/19/17 0800  vancomycin (VANCOCIN) IVPB 1000 mg/200 mL premix     1,000 mg 200 mL/hr over 60 Minutes Intravenous  Once 10/19/17 0734     10/09/17 1100  erythromycin ethylsuccinate (EES) 200 MG/5ML suspension 400 mg     400 mg Per Tube Every 8 hours 10/09/17 1028     10/07/17 0900  vancomycin (VANCOCIN) 500 mg in sodium chloride 0.9 % 100 mL IVPB  Status:  Discontinued     500 mg 100 mL/hr over 60 Minutes Intravenous Every 8 hours 10/07/17 0343 10/07/17 1033   10/06/17 0100  vancomycin (VANCOCIN) 500 mg in sodium chloride 0.9 % 100 mL IVPB  Status:  Discontinued     500 mg 100 mL/hr over 60 Minutes Intravenous Every 12 hours 10/05/17 1540 10/07/17 0343   10/05/17 1145  vancomycin (VANCOCIN) IVPB 1000 mg/200 mL premix     1,000 mg 200 mL/hr over 60 Minutes Intravenous  Once 10/05/17 1132 10/05/17 1401   10/01/17 2200  levofloxacin (LEVAQUIN) IVPB 750 mg  Status:  Discontinued     750 mg 100 mL/hr over 90 Minutes Intravenous Every 48 hours 10/01/17 2150 10/02/17 1054   10/01/17 1800  levofloxacin (LEVAQUIN) IVPB 500 mg  Status:  Discontinued     500 mg 100 mL/hr over 60 Minutes Intravenous Daily-1800 09/30/17 1853 10/01/17 0804   10/01/17 0900  piperacillin-tazobactam (ZOSYN) IVPB 3.375 g     3.375 g 12.5 mL/hr over 240 Minutes Intravenous Every 8 hours 10/01/17 0804 10/08/17 0545   09/30/17 1700  levofloxacin (LEVAQUIN) IVPB 750 mg     750 mg 100 mL/hr over 90 Minutes Intravenous  Once 09/30/17 1653 10/01/17 0819      CXR: Severe hyperinflation.  No acute findings,  Trach tube in place EKG: sinus tachy with RBBB  IMPRESSION: Acute/chronic respiratory failure Very severe COPD with acute exacerbation Prolonged mechanical ventilation Trach tube status - placed 05/16 Intermittent, severe agitation  Positive blood culture. Even though the culture was 1 out of 4 because she has had multiple temperature spikes over the past several days will empirically start on vancomycin. We'll repeat blood cultures and depending upon the result will decide what to do with the vancomycin.  PLAN/REC: Cont vent support - settings reviewed and/or adjusted Wean in PSV as tolerated Cont vent bundle Continue nebulized steroids and bronchodilators Continue systemic steroids at current dose (pred 40 mg daily) Monitor BMET intermittently Monitor I/Os Correct electrolytes as indicated  Resume TF protocol 05/16 DVT px: Enoxaparin (holding at midnight tonight for procedure) Monitor CBC intermittently Transfuse per usual guidelines Monitor temp, WBC count Micro and abx as above  Try to transition off of continuous infusions of fent and midaz to Duragesic, risperidone, alprazolam PT consult requested for 05/17  [name]  Aahana Elza, DOPatient ID: [name], female   DOB: 05-18-1961, 57 y.o.   MRN: [id number]

## 2017-10-19 NOTE — Progress Notes (Signed)
Pt has been on PSV 114/5 twice today for about an hour. See RT note. Alert much of day. Mouths words. Extreme weakness of all extremities. On 75 mcg IV fentanyl and scheduled xanax. Versed gtt off since 0800. No bouts of elevated HR or HTN with exception of linen change at bath. That episode resolved quickly on its own. More blood cx sent today and one dose vanco given. CXR was repeated. Temps improving some.

## 2017-10-20 LAB — GLUCOSE, CAPILLARY
GLUCOSE-CAPILLARY: 127 mg/dL — AB (ref 65–99)
Glucose-Capillary: 129 mg/dL — ABNORMAL HIGH (ref 65–99)
Glucose-Capillary: 139 mg/dL — ABNORMAL HIGH (ref 65–99)
Glucose-Capillary: 143 mg/dL — ABNORMAL HIGH (ref 65–99)
Glucose-Capillary: 143 mg/dL — ABNORMAL HIGH (ref 65–99)

## 2017-10-20 MED ORDER — MIDAZOLAM HCL 2 MG/2ML IJ SOLN
1.0000 mg | INTRAMUSCULAR | Status: DC | PRN
  Administered 2017-10-20 (×2): 2 mg via INTRAVENOUS
  Filled 2017-10-20 (×2): qty 2

## 2017-10-20 NOTE — Progress Notes (Signed)
Trach care performed with patient, new inner cannula inserted, and gauze replaced as well.

## 2017-10-20 NOTE — Progress Notes (Signed)
[name] NAME: Michelle [name]    MR#:  [id number]  DATE OF BIRTH:  [birth year]  SUBJECTIVE:  Patient seen and evaluated today On tracheostomy with ventilator support Received xanax this am and sleepy Wakes up on loud verbal commands  - Trach tube placed 10/17/17.   REVIEW OF SYSTEMS:  Review of Systems  Unable to perform ROS: Critical illness    DRUG ALLERGIES:   Allergies  Allergen Reactions  . Carvedilol Other (See Comments)    Pt gets "sick" when on medication    VITALS:  Blood pressure 103/66, pulse (!) 102, temperature 100 F (37.8 C), resp. rate 16, height 4\' 9"  (1.448 m), weight 59.5 kg (131 lb 2.8 oz), last menstrual period 03/06/2005, SpO2 97 %.  PHYSICAL EXAMINATION:  Physical Exam  GENERAL:  57 y.o.-year-old patient lying in the bed with ventilator support EYES: Pupils equal, round, reactive to light and accommodation. No scleral icterus.  HEENT: Head atraumatic, normocephalic. Oropharynx and nasopharynx clear. has OG tube  NECK:  Supple, no jugular venous distention. No thyroid enlargement, no tendHrness.  Has tracheostomy LUNGS: Normal breath sounds bilaterally, no wheezing, rales,rhonchi or crepitation. No use of accessory muscles of respiration. Decreased bibasilar breath sounds Vent setting  Tidal volume 450 Rate 15 Peep 5 Fi02 30% CARDIOVASCULAR: S1, S2 normal. No murmurs, rubs, or gallops.  ABDOMEN: Soft, nontender, nondistended.  Hypoactive bowel sounds present. No organomegaly or mass.  EXTREMITIES: No cyanosis, or clubbing. 1-2+ edema of hands and feet edema NEUROLOGIC: Awake, responds to verbal commands Not completely oriented to time, place and person PSYCHIATRIC: Unable to assess completely SKIN: No obvious rash, lesion, or ulcer.    LABORATORY PANEL:   CBC Recent Labs  Lab 10/19/17 0544  WBC 7.0  HGB 9.2*  HCT 28.8*  PLT 162    ------------------------------------------------------------------------------------------------------------------  Chemistries  Recent Labs  Lab 10/19/17 0544  NA 135  K 3.9  CL 100*  CO2 30  GLUCOSE 130*  BUN 31*  CREATININE 0.38*  CALCIUM 8.1*   ------------------------------------------------------------------------------------------------------------------  Cardiac Enzymes No results for input(s): TROPONINI in the last 168 hours. ------------------------------------------------------------------------------------------------------------------  RADIOLOGY:  Dg Chest Port 1 View  Result Date: 10/19/2017 CLINICAL DATA:  Mechanically assisted ventilation EXAM: PORTABLE CHEST 1 VIEW COMPARISON:  Two days ago FINDINGS: Tracheostomy tube is well seated. An orogastric tube reaches the stomach. Right upper extremity PICC with tip at the SVC level. Large lung volumes. There is no edema, consolidation, effusion, or pneumothorax. IMPRESSION: 1. Stable positioning of tubes and central line. 2. COPD.  No acute superimposed finding. Electronically Signed   By: Monte Fantasia M.D.   On: 10/19/2017 09:43    EKG:   Orders placed or performed during the hospital encounter of 09/30/17  . EKG 12-Lead  . EKG 12-Lead  . EKG 12-Lead  . EKG 12-Lead  . EKG 12-Lead  . EKG 12-Lead  . EKG 12-Lead  . EKG 12-Lead    ASSESSMENT AND PLAN:   Michelle [name] a57 y.o.femalewith a known history of anxiety, CHF, cocaine abuse, COPD, coronary artery disease, hepatitis C, hypertension admitted for acute respiratory failure and intubated.  1.  Acute on chronic respiratory failure with hypoxia-secondary to acute on chronic COPD exacerbation\ -On tracheostomy with ventilator support -Continue Vent bundle Weaning trials as per ICU attending -Appreciate pulmonary management  -On steroids, nebs.  On IV zosyn ABX - off sedation drip  2.  Hypotension improved Off pressor meds  3.  Anxiety-continue  Xanax scheduled.   4.  DVT prophylaxis-subcutaneous heparin  5. Low grade fever improved 1 set of blood culture is positive for staph species Patient started on vancomycin antibiotic intravenously Chest Xray reviewed no pneumonia  All the records are reviewed and case discussed with Care Management/Social Workerr. Management plans discussed with the patient, family and they are in agreement.  CODE STATUS: Full Code  TOTAL TIME TAKING CARE OF THIS PATIENT: 34 minutes.   POSSIBLE D/C IN ? DAYS, DEPENDING ON CLINICAL CONDITION.   Saundra Shelling M.D on 10/20/2017 at 11:55 AM  Between 7am to 6pm - Pager - [phone]  After 6pm go to [email or web address] - password [email or web address] [facility]  Office  [phone]  CC: Primary care physician; Center, [facility]

## 2017-10-20 NOTE — Progress Notes (Signed)
Patient has been more calm this shift than last night. Did not have to go up on sedation. Remained at 75 mcg this shift.  Medicated with morphine x 1 per patient indication she was having pain. Was not effective, but Versed 2 mg was effective and patient was able to sleep most of the shift.  Tolerating Tube feeding. Good urinary output via Foley.  Has had 1 unit of sliding scale q 4 hours this shift. Low grade temp this shift. No other concerns at this time.

## 2017-10-20 NOTE — Progress Notes (Signed)
Over the last 24 hours sedation has been decreased. Patient is more responsive. Didn't awakening this morning. Still requiring a fair amount of ventilatory support. We'll try to decrease pressure as tolerated  Vitals:   10/20/17 0600 10/20/17 0700 10/20/17 0752 10/20/17 0800  BP: (!) 122/91 (!) 98/59  (!) 102/59  Pulse: (!) 114 (!) 118  (!) 112  Resp: 20 19  19   Temp: (!) 100.8 F (38.2 C) (!) 100.4 F (38 C)  (!) 100.6 F (38.1 C)  TempSrc:      SpO2: 94% 94% 97% 95%  Weight:      Height:        RASS 0 HEENT: NAD No JVD Prolonged expiratory phase, no wheezes anteriorly Tacyh, reg, no M Mildly distended, soft, diminished BS Ext warm, no edema No focal neurologic deficits  BMP Latest Ref Rng & Units 10/19/2017 10/16/2017 10/15/2017  Glucose 65 - 99 mg/dL 130(H) 158(H) 182(H)  BUN 6 - 20 mg/dL 31(H) 35(H) 45(H)  Creatinine 0.44 - 1.00 mg/dL 0.38(L) 0.43(L) 0.51  Sodium 135 - 145 mmol/L 135 137 136  Potassium 3.5 - 5.1 mmol/L 3.9 4.3 4.0  Chloride 101 - 111 mmol/L 100(L) 98(L) 99(L)  CO2 22 - 32 mmol/L 30 34(H) 33(H)  Calcium 8.9 - 10.3 mg/dL 8.1(L) 9.2 9.0    CBC Latest Ref Rng & Units 10/19/2017 10/17/2017 10/16/2017  WBC 3.6 - 11.0 K/uL 7.0 7.1 11.7(H)  Hemoglobin 12.0 - 16.0 g/dL 9.2(L) 8.6(L) 9.7(L)  Hematocrit 35.0 - 47.0 % 28.8(L) 26.3(L) 30.2(L)  Platelets 150 - 440 K/uL 162 175 203     Results for orders placed or performed during the hospital encounter of 09/30/17  CULTURE, BLOOD (ROUTINE X 2) w Reflex to ID Panel     Status: None   Collection Time: 09/30/17  5:43 PM  Result Value Ref Range Status   Specimen Description BLOOD CENTRAL LINE  Final   Special Requests   Final    BOTTLES DRAWN AEROBIC AND ANAEROBIC Blood Culture adequate volume   Culture   Final    NO GROWTH 5 DAYS Performed at [facility], [address] [place], [place] [address]    Report Status 10/05/2017 FINAL  Final  MRSA PCR Screening     Status: None   Collection Time: 09/30/17   8:02 PM  Result Value Ref Range Status   MRSA by PCR NEGATIVE NEGATIVE Final    Comment:        The GeneXpert MRSA Assay (FDA approved for NASAL specimens only), is one component of a comprehensive MRSA colonization surveillance program. It is not intended to diagnose MRSA infection nor to guide or monitor treatment for MRSA infections. Performed at [facility], [address] [place], [place] [address]   Urine Culture     Status: None   Collection Time: 09/30/17  8:04 PM  Result Value Ref Range Status   Specimen Description   Final    URINE, RANDOM Performed at [facility], [address], [place], [place] [address]    Special Requests   Final    NONE Performed at [facility], [address], [place], [place] [address]    Culture   Final    NO GROWTH Performed at [facility], [place] [address] [place], [place] [address]    Report Status 10/02/2017 FINAL  Final  CULTURE, BLOOD (ROUTINE X 2) w Reflex to ID Panel     Status: None   Collection  Time: 09/30/17  8:16 PM  Result Value Ref Range Status   Specimen Description BLOOD L HAND  Final   Special Requests   Final    BOTTLES DRAWN AEROBIC AND ANAEROBIC Blood Culture results may not be optimal due to an inadequate volume of blood received in culture bottles   Culture   Final    NO GROWTH 5 DAYS Performed at [facility], [address], [place], [place] [address]    Report Status 10/05/2017 FINAL  Final  Culture, respiratory (NON-Expectorated)     Status: None   Collection Time: 09/30/17  9:07 PM  Result Value Ref Range Status   Specimen Description   Final    TRACHEAL ASPIRATE Performed at [facility], [address] [place], [place] [address]    Special Requests   Final    NONE Performed at [facility], [address] [place], [place] [address]    Gram Stain   Final    MODERATE WBC PRESENT, PREDOMINANTLY PMN FEW GRAM POSITIVE COCCI IN PAIRS  IN CLUSTERS FEW GRAM NEGATIVE RODS    Culture   Final    FEW Consistent with normal respiratory flora. Performed at [facility], [place] [address], [place], [place] [address]    Report Status 10/03/2017 FINAL  Final  CULTURE, BLOOD (ROUTINE X 2) w Reflex to ID Panel     Status: None   Collection Time: 10/05/17 11:48 AM  Result Value Ref Range Status   Specimen Description BLOOD LAC  Final   Special Requests   Final    BOTTLES DRAWN AEROBIC AND ANAEROBIC Blood Culture adequate volume   Culture   Final    NO GROWTH 5 DAYS Performed at [facility], [address] [place], [place] [address]    Report Status 10/10/2017 FINAL  Final  CULTURE, BLOOD (ROUTINE X 2) w Reflex to ID Panel     Status: None   Collection Time: 10/05/17 11:55 AM  Result Value Ref Range Status   Specimen Description BLOOD LT HAND  Final   Special Requests   Final    BOTTLES DRAWN AEROBIC AND ANAEROBIC Blood Culture adequate volume   Culture   Final    NO GROWTH 5 DAYS Performed at [facility], [address], [place], [place] [address]    Report Status 10/10/2017 FINAL  Final  Culture, expectorated sputum-assessment     Status: None   Collection Time: 10/05/17  2:18 PM  Result Value Ref Range Status   Specimen Description TRACHEAL ASPIRATE  Final   Special Requests NONE  Final   Sputum evaluation   Final    THIS SPECIMEN IS ACCEPTABLE FOR SPUTUM CULTURE Performed at [facility], [address] [place], [place] [address]    Report Status 10/05/2017 FINAL  Final  Culture, respiratory (NON-Expectorated)     Status: None   Collection Time: 10/05/17  2:18 PM  Result Value Ref Range Status   Specimen Description   Final    TRACHEAL ASPIRATE Performed at [facility], [address] [place],  [address]    Special Requests   Final    NONE Reflexed from [phone] Performed at [facility], [address] [place], [place] [address]    Gram Stain    Final    MODERATE WBC PRESENT,BOTH PMN AND MONONUCLEAR NO SQUAMOUS EPITHELIAL CELLS SEEN FEW YEAST WITH PSEUDOHYPHAE RARE GRAM POSITIVE COCCI IN CLUSTERS Performed at [facility],  [address] [place], [place] [address]    Culture FEW CANDIDA ALBICANS FEW CANDIDA DUBLINIENSIS   Final   Report Status 10/08/2017 FINAL  Final  CULTURE, BLOOD (ROUTINE X 2) w Reflex to ID Panel     Status: None (Preliminary result)   Collection Time: 10/18/17 10:51 AM  Result Value Ref Range Status   Specimen Description BLOOD LEFT ANTECUBITAL  Final   Special Requests   Final    BOTTLES DRAWN AEROBIC AND ANAEROBIC Blood Culture adequate volume   Culture  Setup Time   Final    Organism ID to follow GRAM POSITIVE COCCI ANAEROBIC BOTTLE ONLY CRITICAL RESULT CALLED TO, READ BACK BY AND VERIFIED WITH: MELISSA Hominy 10/19/17 @ 7619  [place] Performed at [facility], [address] [place], [place] [address]    Culture GRAM POSITIVE COCCI  Final   Report Status PENDING  Incomplete  Blood Culture ID Panel (Reflexed)     Status: Abnormal   Collection Time: 10/18/17 10:51 AM  Result Value Ref Range Status   Enterococcus species NOT DETECTED NOT DETECTED Final   Listeria monocytogenes NOT DETECTED NOT DETECTED Final   Staphylococcus species DETECTED (A) NOT DETECTED Final    Comment: Methicillin (oxacillin) resistant coagulase negative staphylococcus. Possible blood culture contaminant (unless isolated from more than one blood culture draw or clinical case suggests pathogenicity). No antibiotic treatment is indicated for blood  culture contaminants. CRITICAL RESULT CALLED TO, READ BACK BY AND VERIFIED WITH: MELISSA [place] 10/19/17 @ 6712  [place]    Staphylococcus aureus NOT DETECTED NOT DETECTED Final   Methicillin resistance DETECTED (A) NOT DETECTED Final    Comment: CRITICAL RESULT CALLED TO, READ BACK BY AND VERIFIED WITH: MELISSA MACCIA 10/19/17 @ 4580  [place]    Streptococcus species NOT  DETECTED NOT DETECTED Final   Streptococcus agalactiae NOT DETECTED NOT DETECTED Final   Streptococcus pneumoniae NOT DETECTED NOT DETECTED Final   Streptococcus pyogenes NOT DETECTED NOT DETECTED Final   Acinetobacter baumannii NOT DETECTED NOT DETECTED Final   Enterobacteriaceae species NOT DETECTED NOT DETECTED Final   Enterobacter cloacae complex NOT DETECTED NOT DETECTED Final   Escherichia coli NOT DETECTED NOT DETECTED Final   Klebsiella oxytoca NOT DETECTED NOT DETECTED Final   Klebsiella pneumoniae NOT DETECTED NOT DETECTED Final   Proteus species NOT DETECTED NOT DETECTED Final   Serratia marcescens NOT DETECTED NOT DETECTED Final   Haemophilus influenzae NOT DETECTED NOT DETECTED Final   Neisseria meningitidis NOT DETECTED NOT DETECTED Final   Pseudomonas aeruginosa NOT DETECTED NOT DETECTED Final   Candida albicans NOT DETECTED NOT DETECTED Final   Candida glabrata NOT DETECTED NOT DETECTED Final   Candida krusei NOT DETECTED NOT DETECTED Final   Candida parapsilosis NOT DETECTED NOT DETECTED Final   Candida tropicalis NOT DETECTED NOT DETECTED Final    Comment: Performed at [facility], [address] [place], [place] [address]  Culture, respiratory (NON-Expectorated)     Status: None (Preliminary result)   Collection Time: 10/18/17 11:09 AM  Result Value Ref Range Status   Specimen Description   Final    TRACHEAL ASPIRATE Performed at [facility], [address] [place], [place] [address]    Special Requests   Final    NONE Performed at [facility], [place], [place] [address]    Gram Stain   Final    MODERATE WBC PRESENT, PREDOMINANTLY PMN NO ORGANISMS SEEN    Culture   Final  CULTURE REINCUBATED FOR BETTER GROWTH Performed at [facility], [place] [address] [place], [place] [address]    Report Status PENDING  Incomplete  CULTURE, BLOOD (ROUTINE X 2) w Reflex to ID Panel     Status: None (Preliminary  result)   Collection Time: 10/18/17  4:09 PM  Result Value Ref Range Status   Specimen Description BLOOD L FA  Final   Special Requests   Final    BOTTLES DRAWN AEROBIC AND ANAEROBIC Blood Culture adequate volume   Culture   Final    NO GROWTH 2 DAYS Performed at [facility], [address] [place], [place] [address]    Report Status PENDING  Incomplete  CULTURE, BLOOD (ROUTINE X 2) w Reflex to ID Panel     Status: None (Preliminary result)   Collection Time: 10/19/17  8:36 AM  Result Value Ref Range Status   Specimen Description BLOOD BLOOD LEFT HAND  Final   Special Requests   Final    BOTTLES DRAWN AEROBIC AND ANAEROBIC Blood Culture results may not be optimal due to an inadequate volume of blood received in culture bottles   Culture   Final    NO GROWTH < 24 HOURS Performed at [facility], [address], [place], [place] [address]    Report Status PENDING  Incomplete  CULTURE, BLOOD (ROUTINE X 2) w Reflex to ID Panel     Status: None (Preliminary result)   Collection Time: 10/19/17  8:52 AM  Result Value Ref Range Status   Specimen Description BLOOD A-LINE DRAW  Final   Special Requests   Final    BOTTLES DRAWN AEROBIC AND ANAEROBIC Blood Culture adequate volume   Culture   Final    NO GROWTH < 24 HOURS Performed at [facility], [address] [place], [place] [address]    Report Status PENDING  Incomplete   Anti-infectives (From admission, onward)   Start     Dose/Rate Route Frequency Ordered Stop   10/19/17 2100  vancomycin (VANCOCIN) 500 mg in sodium chloride 0.9 % 100 mL IVPB     500 mg 100 mL/hr over 60 Minutes Intravenous Every 12 hours 10/19/17 0915     10/19/17 0800  vancomycin (VANCOCIN) IVPB 1000 mg/200 mL premix     1,000 mg 200 mL/hr over 60 Minutes Intravenous  Once 10/19/17 0734 10/19/17 1000   10/09/17 1100  erythromycin ethylsuccinate (EES) 200 MG/5ML suspension 400 mg     400 mg Per Tube Every 8 hours 10/09/17 1028      10/07/17 0900  vancomycin (VANCOCIN) 500 mg in sodium chloride 0.9 % 100 mL IVPB  Status:  Discontinued     500 mg 100 mL/hr over 60 Minutes Intravenous Every 8 hours 10/07/17 0343 10/07/17 1033   10/06/17 0100  vancomycin (VANCOCIN) 500 mg in sodium chloride 0.9 % 100 mL IVPB  Status:  Discontinued     500 mg 100 mL/hr over 60 Minutes Intravenous Every 12 hours 10/05/17 1540 10/07/17 0343   10/05/17 1145  vancomycin (VANCOCIN) IVPB 1000 mg/200 mL premix     1,000 mg 200 mL/hr over 60 Minutes Intravenous  Once 10/05/17 1132 10/05/17 1401   10/01/17 2200  levofloxacin (LEVAQUIN) IVPB 750 mg  Status:  Discontinued     750 mg 100 mL/hr over 90 Minutes Intravenous Every 48 hours 10/01/17 2150 10/02/17 1054   10/01/17 1800  levofloxacin (LEVAQUIN) IVPB 500 mg  Status:  Discontinued  500 mg 100 mL/hr over 60 Minutes Intravenous Daily-1800 09/30/17 1853 10/01/17 0804   10/01/17 0900  piperacillin-tazobactam (ZOSYN) IVPB 3.375 g     3.375 g 12.5 mL/hr over 240 Minutes Intravenous Every 8 hours 10/01/17 0804 10/08/17 0545   09/30/17 1700  levofloxacin (LEVAQUIN) IVPB 750 mg     750 mg 100 mL/hr over 90 Minutes Intravenous  Once 09/30/17 1653 10/01/17 0819      CXR: Severe hyperinflation.  No acute findings, Trach tube in place EKG: sinus tachy with RBBB  IMPRESSION: Acute/chronic respiratory failure Very severe COPD with acute exacerbation Prolonged mechanical ventilation Trach tube status - placed 05/16 Intermittent, severe agitation  Positive blood culture. Even though the culture was 1 out of 4 because she has had multiple temperature spikes over the past several days will empirically start on vancomycin. We'll repeat blood cultures and depending upon the result will decide what to do with the vancomycin.  PLAN/REC: Cont vent support - settings reviewed and/or adjusted Wean in PSV as tolerated Cont vent bundle Continue nebulized steroids and bronchodilators Continue systemic  steroids at current dose (pred 40 mg daily) Monitor BMET intermittently Monitor I/Os Correct electrolytes as indicated  Resume TF protocol 05/16 DVT px: Enoxaparin (holding at midnight tonight for procedure) Monitor CBC intermittently Transfuse per usual guidelines Monitor temp, WBC count Micro and abx as above  Try to transition off of continuous infusions of fent and midaz to Duragesic, risperidone, alprazolam PT consult requested for 05/17  [name], DOPatient ID: Michelle [name], female   DOB: [birth year], 57 y.o.   MRN: [id number] Patient ID: Michelle [name], female   DOB: 1961/03/29, 57 y.o.   MRN: [id number]

## 2017-10-21 ENCOUNTER — Encounter: Payer: Self-pay | Admitting: Otolaryngology

## 2017-10-21 LAB — CULTURE, BLOOD (ROUTINE X 2): Special Requests: ADEQUATE

## 2017-10-21 LAB — CULTURE, RESPIRATORY

## 2017-10-21 LAB — GLUCOSE, CAPILLARY
GLUCOSE-CAPILLARY: 172 mg/dL — AB (ref 65–99)
GLUCOSE-CAPILLARY: 113 mg/dL — AB (ref 65–99)
GLUCOSE-CAPILLARY: 142 mg/dL — AB (ref 65–99)
GLUCOSE-CAPILLARY: 154 mg/dL — AB (ref 65–99)
GLUCOSE-CAPILLARY: 164 mg/dL — AB (ref 65–99)
Glucose-Capillary: 105 mg/dL — ABNORMAL HIGH (ref 65–99)
Glucose-Capillary: 107 mg/dL — ABNORMAL HIGH (ref 65–99)
Glucose-Capillary: 131 mg/dL — ABNORMAL HIGH (ref 65–99)

## 2017-10-21 LAB — BASIC METABOLIC PANEL
Anion gap: 6 (ref 5–15)
BUN: 21 mg/dL — ABNORMAL HIGH (ref 6–20)
CHLORIDE: 98 mmol/L — AB (ref 101–111)
CO2: 31 mmol/L (ref 22–32)
Calcium: 8.4 mg/dL — ABNORMAL LOW (ref 8.9–10.3)
Glucose, Bld: 122 mg/dL — ABNORMAL HIGH (ref 65–99)
POTASSIUM: 4 mmol/L (ref 3.5–5.1)
Sodium: 135 mmol/L (ref 135–145)

## 2017-10-21 MED ORDER — SENNOSIDES-DOCUSATE SODIUM 8.6-50 MG PO TABS
1.0000 | ORAL_TABLET | Freq: Two times a day (BID) | ORAL | Status: DC
  Administered 2017-10-21 – 2017-12-25 (×108): 1
  Filled 2017-10-21 (×119): qty 1

## 2017-10-21 MED ORDER — OXYCODONE HCL 5 MG/5ML PO SOLN: 5.0000 mg | ORAL | Status: DC | PRN

## 2017-10-21 MED ORDER — ALPRAZOLAM 0.5 MG PO TABS
0.5000 mg | ORAL_TABLET | Freq: Three times a day (TID) | ORAL | Status: DC
  Administered 2017-10-21 – 2017-10-29 (×22): 0.5 mg
  Filled 2017-10-21 (×22): qty 1

## 2017-10-21 MED ORDER — ALPRAZOLAM 0.25 MG PO TABS
0.2500 mg | ORAL_TABLET | Freq: Three times a day (TID) | ORAL | Status: DC | PRN
  Administered 2017-10-28: 0.25 mg via ORAL
  Filled 2017-10-21 (×2): qty 1

## 2017-10-21 NOTE — Clinical Social Work Note (Signed)
CSW to follow up with vent/snf facilities to see if there is any facility that might be interested in taking patient. According to physician documentation today, they are working to get patient off of continuous infusions. Shela Leff MSW,LCSW [phone]

## 2017-10-21 NOTE — Progress Notes (Signed)
Patient has been asleep on and off this shift. Only had issue with hypertension once and was resolved with 2.5 mg of IV metoprolol. Complained of pain once and was medicated with 2 mg of Versed which was effective. Good urine output. Continues on 75 mcg of Fentanyl for comfort. No other concerns at this time.

## 2017-10-21 NOTE — Progress Notes (Signed)
CC follow up resp failure  HPI remains on full vent support S/p trach Needs vent to survive Critically ill  Vitals:   10/21/17 0416 10/21/17 0500 10/21/17 0600 10/21/17 0736  BP:  102/60 121/81   Pulse:  95 88 90  Resp:  15 14 15   Temp:  (!) 97.3 F (36.3 C) 99.5 F (37.5 C)   TempSrc:      SpO2:  95% 98% 97%  Weight: 132 lb 0.9 oz (59.9 kg)     Height:        RASS 0 HEENT: NAD, s/p trach, on vent No JVD Prolonged expiratory phase, no wheezes anteriorly Tacyh, reg, no M Mildly distended, soft, diminished BS Ext warm, no edema No focal neurologic deficits  BMP Latest Ref Rng & Units 10/21/2017 10/19/2017 10/16/2017  Glucose 65 - 99 mg/dL 122(H) 130(H) 158(H)  BUN 6 - 20 mg/dL 21(H) 31(H) 35(H)  Creatinine 0.44 - 1.00 mg/dL <0.30(L) 0.38(L) 0.43(L)  Sodium 135 - 145 mmol/L 135 135 137  Potassium 3.5 - 5.1 mmol/L 4.0 3.9 4.3  Chloride 101 - 111 mmol/L 98(L) 100(L) 98(L)  CO2 22 - 32 mmol/L 31 30 34(H)  Calcium 8.9 - 10.3 mg/dL 8.4(L) 8.1(L) 9.2    CBC Latest Ref Rng & Units 10/19/2017 10/17/2017 10/16/2017  WBC 3.6 - 11.0 K/uL 7.0 7.1 11.7(H)  Hemoglobin 12.0 - 16.0 g/dL 9.2(L) 8.6(L) 9.7(L)  Hematocrit 35.0 - 47.0 % 28.8(L) 26.3(L) 30.2(L)  Platelets 150 - 440 K/uL 162 175 203     Results for orders placed or performed during the hospital encounter of 09/30/17  CULTURE, BLOOD (ROUTINE X 2) w Reflex to ID Panel     Status: None   Collection Time: 09/30/17  5:43 PM  Result Value Ref Range Status   Specimen Description BLOOD CENTRAL LINE  Final   Special Requests   Final    BOTTLES DRAWN AEROBIC AND ANAEROBIC Blood Culture adequate volume   Culture   Final    NO GROWTH 5 DAYS Performed at [facility], [address] [place], [place] [address]    Report Status 10/05/2017 FINAL  Final  MRSA PCR Screening     Status: None   Collection Time: 09/30/17  8:02 PM  Result Value Ref Range Status   MRSA by PCR NEGATIVE NEGATIVE Final    Comment:        The  GeneXpert MRSA Assay (FDA approved for NASAL specimens only), is one component of a comprehensive MRSA colonization surveillance program. It is not intended to diagnose MRSA infection nor to guide or monitor treatment for MRSA infections. Performed at [facility], [address], [place], [place] [address]   Urine Culture     Status: None   Collection Time: 09/30/17  8:04 PM  Result Value Ref Range Status   Specimen Description   Final    URINE, RANDOM Performed at [facility], [address], [place], [place] [address]    Special Requests   Final    NONE Performed at [facility], [address], [place], [place] [address]    Culture   Final    NO GROWTH Performed at [facility], [place] [address] [place],  [address]    Report Status 10/02/2017 FINAL  Final  CULTURE, BLOOD (ROUTINE X 2) w Reflex to ID Panel     Status: None   Collection Time: 09/30/17  8:16 PM  Result Value Ref Range Status   Specimen  Description BLOOD L HAND  Final   Special Requests   Final    BOTTLES DRAWN AEROBIC AND ANAEROBIC Blood Culture results may not be optimal due to an inadequate volume of blood received in culture bottles   Culture   Final    NO GROWTH 5 DAYS Performed at [facility], [address] [place], [place] [address]    Report Status 10/05/2017 FINAL  Final  Culture, respiratory (NON-Expectorated)     Status: None   Collection Time: 09/30/17  9:07 PM  Result Value Ref Range Status   Specimen Description   Final    TRACHEAL ASPIRATE Performed at [facility], [address], [place], [place] [address]    Special Requests   Final    NONE Performed at [facility], [address] [place], [place] [address]    Gram Stain   Final    MODERATE WBC PRESENT, PREDOMINANTLY PMN FEW GRAM POSITIVE COCCI IN PAIRS IN CLUSTERS FEW GRAM NEGATIVE RODS    Culture   Final    FEW Consistent with normal respiratory  flora. Performed at [facility], [place] [address], [place], [place] [address]    Report Status 10/03/2017 FINAL  Final  CULTURE, BLOOD (ROUTINE X 2) w Reflex to ID Panel     Status: None   Collection Time: 10/05/17 11:48 AM  Result Value Ref Range Status   Specimen Description BLOOD LAC  Final   Special Requests   Final    BOTTLES DRAWN AEROBIC AND ANAEROBIC Blood Culture adequate volume   Culture   Final    NO GROWTH 5 DAYS Performed at [facility], [address] [place], [place] [address]    Report Status 10/10/2017 FINAL  Final  CULTURE, BLOOD (ROUTINE X 2) w Reflex to ID Panel     Status: None   Collection Time: 10/05/17 11:55 AM  Result Value Ref Range Status   Specimen Description BLOOD LT HAND  Final   Special Requests   Final    BOTTLES DRAWN AEROBIC AND ANAEROBIC Blood Culture adequate volume   Culture   Final    NO GROWTH 5 DAYS Performed at [facility], [address] [place], [place] [address]    Report Status 10/10/2017 FINAL  Final  Culture, expectorated sputum-assessment     Status: None   Collection Time: 10/05/17  2:18 PM  Result Value Ref Range Status   Specimen Description TRACHEAL ASPIRATE  Final   Special Requests NONE  Final   Sputum evaluation   Final    THIS SPECIMEN IS ACCEPTABLE FOR SPUTUM CULTURE Performed at [facility], [address], [place], [place] [address]    Report Status 10/05/2017 FINAL  Final  Culture, respiratory (NON-Expectorated)     Status: None   Collection Time: 10/05/17  2:18 PM  Result Value Ref Range Status   Specimen Description   Final    TRACHEAL ASPIRATE Performed at [facility], [address] [place], [place] [address]    Special Requests   Final    NONE Reflexed from [id number] Performed at [facility], [address] [place], [place] [address]    Gram Stain   Final    MODERATE WBC PRESENT,BOTH PMN AND MONONUCLEAR NO SQUAMOUS EPITHELIAL CELLS SEEN FEW  YEAST WITH PSEUDOHYPHAE RARE GRAM POSITIVE COCCI IN CLUSTERS Performed at [facility], [place] [address], [place],  [address]    Culture FEW CANDIDA ALBICANS  FEW CANDIDA DUBLINIENSIS   Final   Report Status 10/08/2017 FINAL  Final  CULTURE, BLOOD (ROUTINE X 2) w Reflex to ID Panel     Status: Abnormal   Collection Time: 10/18/17 10:51 AM  Result Value Ref Range Status   Specimen Description   Final    BLOOD LEFT ANTECUBITAL Performed at [facility], [address] [place], [place] [address]    Special Requests   Final    BOTTLES DRAWN AEROBIC AND ANAEROBIC Blood Culture adequate volume Performed at [facility], [address] [place], [place] [address]    Culture  Setup Time   Final    GRAM POSITIVE COCCI ANAEROBIC BOTTLE ONLY CRITICAL RESULT CALLED TO, READ BACK BY AND VERIFIED WITH: MELISSA MACCIA 10/19/17 @ 0714  MLK    Culture (A)  Final    STAPHYLOCOCCUS SPECIES (COAGULASE NEGATIVE) THE SIGNIFICANCE OF ISOLATING THIS ORGANISM FROM A SINGLE SET OF BLOOD CULTURES WHEN MULTIPLE SETS ARE DRAWN IS UNCERTAIN. PLEASE NOTIFY THE MICROBIOLOGY DEPARTMENT WITHIN ONE WEEK IF SPECIATION AND SENSITIVITIES ARE REQUIRED. Performed at [facility], [place] [address] [place], [place] [address]    Report Status 10/21/2017 FINAL  Final  Blood Culture ID Panel (Reflexed)     Status: Abnormal   Collection Time: 10/18/17 10:51 AM  Result Value Ref Range Status   Enterococcus species NOT DETECTED NOT DETECTED Final   Listeria monocytogenes NOT DETECTED NOT DETECTED Final   Staphylococcus species DETECTED (A) NOT DETECTED Final    Comment: Methicillin (oxacillin) resistant coagulase negative staphylococcus. Possible blood culture contaminant (unless isolated from more than one blood culture draw or clinical case suggests pathogenicity). No antibiotic treatment is indicated for blood  culture contaminants. CRITICAL RESULT CALLED TO, READ BACK BY AND VERIFIED  WITH: MELISSA Ringsted 10/19/17 @ 9147  [place]    Staphylococcus aureus NOT DETECTED NOT DETECTED Final   Methicillin resistance DETECTED (A) NOT DETECTED Final    Comment: CRITICAL RESULT CALLED TO, READ BACK BY AND VERIFIED WITH: MELISSA MACCIA 10/19/17 @ 8295  [place]    Streptococcus species NOT DETECTED NOT DETECTED Final   Streptococcus agalactiae NOT DETECTED NOT DETECTED Final   Streptococcus pneumoniae NOT DETECTED NOT DETECTED Final   Streptococcus pyogenes NOT DETECTED NOT DETECTED Final   Acinetobacter baumannii NOT DETECTED NOT DETECTED Final   Enterobacteriaceae species NOT DETECTED NOT DETECTED Final   Enterobacter cloacae complex NOT DETECTED NOT DETECTED Final   Escherichia coli NOT DETECTED NOT DETECTED Final   Klebsiella oxytoca NOT DETECTED NOT DETECTED Final   Klebsiella pneumoniae NOT DETECTED NOT DETECTED Final   Proteus species NOT DETECTED NOT DETECTED Final   Serratia marcescens NOT DETECTED NOT DETECTED Final   Haemophilus influenzae NOT DETECTED NOT DETECTED Final   Neisseria meningitidis NOT DETECTED NOT DETECTED Final   Pseudomonas aeruginosa NOT DETECTED NOT DETECTED Final   Candida albicans NOT DETECTED NOT DETECTED Final   Candida glabrata NOT DETECTED NOT DETECTED Final   Candida krusei NOT DETECTED NOT DETECTED Final   Candida parapsilosis NOT DETECTED NOT DETECTED Final   Candida tropicalis NOT DETECTED NOT DETECTED Final    Comment: Performed at [facility], [address] [place], [place] [address]  Culture, respiratory (NON-Expectorated)     Status: None (Preliminary result)   Collection Time: 10/18/17 11:09 AM  Result Value Ref Range Status   Specimen Description   Final    TRACHEAL ASPIRATE Performed at [facility], [place],  [place] [address]    Special Requests   Final    NONE Performed at [facility], [address] [place], [place] [address]    Gram Stain   Final    MODERATE WBC PRESENT,  PREDOMINANTLY PMN NO ORGANISMS SEEN    Culture   Final    CULTURE REINCUBATED FOR BETTER GROWTH Performed at [facility], [place] [address] [place], [place] [address]    Report Status PENDING  Incomplete  CULTURE, BLOOD (ROUTINE X 2) w Reflex to ID Panel     Status: None (Preliminary result)   Collection Time: 10/18/17  4:09 PM  Result Value Ref Range Status   Specimen Description BLOOD L FA  Final   Special Requests   Final    BOTTLES DRAWN AEROBIC AND ANAEROBIC Blood Culture adequate volume   Culture   Final    NO GROWTH 3 DAYS Performed at [facility], [address], [place], [place] [address]    Report Status PENDING  Incomplete  CULTURE, BLOOD (ROUTINE X 2) w Reflex to ID Panel     Status: None (Preliminary result)   Collection Time: 10/19/17  8:36 AM  Result Value Ref Range Status   Specimen Description BLOOD BLOOD LEFT HAND  Final   Special Requests   Final    BOTTLES DRAWN AEROBIC AND ANAEROBIC Blood Culture results may not be optimal due to an inadequate volume of blood received in culture bottles   Culture   Final    NO GROWTH 2 DAYS Performed at [facility], [address], [place], [place] [address]    Report Status PENDING  Incomplete  CULTURE, BLOOD (ROUTINE X 2) w Reflex to ID Panel     Status: None (Preliminary result)   Collection Time: 10/19/17  8:52 AM  Result Value Ref Range Status   Specimen Description BLOOD A-LINE DRAW  Final   Special Requests   Final    BOTTLES DRAWN AEROBIC AND ANAEROBIC Blood Culture adequate volume   Culture   Final    NO GROWTH 2 DAYS Performed at [facility], [address] [place], [place] [address]    Report Status PENDING  Incomplete   Anti-infectives (From admission, onward)   Start     Dose/Rate Route Frequency Ordered Stop   10/19/17 2100  vancomycin (VANCOCIN) 500 mg in sodium chloride 0.9 % 100 mL IVPB     500 mg 100 mL/hr over 60 Minutes Intravenous Every 12 hours 10/19/17  0915     10/19/17 0800  vancomycin (VANCOCIN) IVPB 1000 mg/200 mL premix     1,000 mg 200 mL/hr over 60 Minutes Intravenous  Once 10/19/17 0734 10/19/17 1000   10/09/17 1100  erythromycin ethylsuccinate (EES) 200 MG/5ML suspension 400 mg     400 mg Per Tube Every 8 hours 10/09/17 1028     10/07/17 0900  vancomycin (VANCOCIN) 500 mg in sodium chloride 0.9 % 100 mL IVPB  Status:  Discontinued     500 mg 100 mL/hr over 60 Minutes Intravenous Every 8 hours 10/07/17 0343 10/07/17 1033   10/06/17 0100  vancomycin (VANCOCIN) 500 mg in sodium chloride 0.9 % 100 mL IVPB  Status:  Discontinued     500 mg 100 mL/hr over 60 Minutes Intravenous Every 12 hours 10/05/17 1540 10/07/17 0343   10/05/17 1145  vancomycin (VANCOCIN) IVPB 1000 mg/200 mL premix     1,000 mg 200 mL/hr over 60 Minutes Intravenous  Once 10/05/17 1132  10/05/17 1401   10/01/17 2200  levofloxacin (LEVAQUIN) IVPB 750 mg  Status:  Discontinued     750 mg 100 mL/hr over 90 Minutes Intravenous Every 48 hours 10/01/17 2150 10/02/17 1054   10/01/17 1800  levofloxacin (LEVAQUIN) IVPB 500 mg  Status:  Discontinued     500 mg 100 mL/hr over 60 Minutes Intravenous Daily-1800 09/30/17 1853 10/01/17 0804   10/01/17 0900  piperacillin-tazobactam (ZOSYN) IVPB 3.375 g     3.375 g 12.5 mL/hr over 240 Minutes Intravenous Every 8 hours 10/01/17 0804 10/08/17 0545   09/30/17 1700  levofloxacin (LEVAQUIN) IVPB 750 mg     750 mg 100 mL/hr over 90 Minutes Intravenous  Once 09/30/17 1653 10/01/17 0819      CXR: Severe hyperinflation.  No acute findings, Trach tube in place EKG: sinus tachy with RBBB    IMPRESSION: Acute/chronic respiratory failure Very severe COPD with acute exacerbation Prolonged mechanical ventilation Trach tube status - placed 05/16 Intermittent, severe agitation  Positive blood culture. Even though the culture was 1 out of 4 because she has had multiple temperature spikes over the past several days will empirically start  on vancomycin. We'll repeat blood cultures   Acute and severe resp failure s/p trach requriing full vent support With evidence of sepsis  PLAN/REC: Cont vent support - settings reviewed and/or adjusted Wean in PSV as tolerated Cont vent bundle Continue nebulized steroids and bronchodilators Continue systemic steroids at current dose (pred 40 mg daily) Monitor BMET intermittently Monitor I/Os Correct electrolytes as indicated  Resume TF protocol 05/16 DVT px: Enoxaparin (holding at midnight tonight for procedure) Monitor CBC intermittently Transfuse per usual guidelines Monitor temp, WBC count Micro and abx as above  Try to transition off of continuous infusions of fent and midaz to Duragesic, risperidone, alprazolam    Critical Care Time devoted to patient care services described in this note is 31 minutes.   Overall, patient is critically ill, prognosis is guarded.   risk for cardiac arrest and death.    Corrin Parker, M.D.  [facility]  Medical Director [place] Director [facility] Cardio-Pulmonary Department

## 2017-10-21 NOTE — Progress Notes (Signed)
Pharmacy Consult:  Pharmacy consulted to assist with constipation management for 57 yo female admitted with COPD exacerbation requiring mechanical ventilation and sedation with fentanyl.  Plan: Patient with bowel movements on 5/19 and 5/20. Will discontinue erythromycin and continued senna/docusate 1 tab BID.   Pharmacy will continue to monitor and adjust per consult.   Greene Diodato L,  10/21/2017 4:36 PM

## 2017-10-21 NOTE — Progress Notes (Signed)
Nutrition Follow-up  DOCUMENTATION CODES:   Non-severe (moderate) malnutrition in context of chronic illness  INTERVENTION:  Continue Vital AF 1.2 at 45 mL/hr (1080 mL goal daily volume) via NGT. Provides 1296 kcal, 81 grams of protein, 875 mL H2O daily.  Continue liquid MVI daily per tube.  With free water flush of 100 mL Q8hrs patient is receiving a total of 1175 mL H2O daily including water in tube feeding. Pump was updated to correct free water flush settings by RN.  If patient is unable to wean from vent will need to consider placement of G-tube. Patient will not be able to discharge to vent/SNF with an NGT in place.  NUTRITION DIAGNOSIS:   Moderate Malnutrition related to chronic illness(COPD, CHF, hepatitis C, cocaine abuse) as evidenced by mild fat depletion, moderate fat depletion, mild muscle depletion, moderate muscle depletion.  Ongoing - addressing with TF regimen.  GOAL:   Provide needs based on ASPEN/SCCM guidelines  Met with TF regimen.  MONITOR:   Vent status, Labs, Weight trends, TF tolerance, I & O's  REASON FOR ASSESSMENT:   Ventilator    ASSESSMENT:   57 year old female with PMHx of emphysema, COPD, HTN, hypercholesterolemia, hx pneumothorax s/p chest tube insertion, CAD s/p cardiac catheterization, cocaine abuse, tobacco abuse, asthma, CHF, anxiety, hepatitis C admitted with acute exacerbation of COPD, acute on chronic respiratory failure requiring intubation 4/29, PNA, cocaine intoxication, AKI due to renal hypoperfusion.   -Patient is s/p tracheostomy tube placement on 5/16. OGT was replaced with NGT.  Patient is intubated through tracheostomy tube and is sedated. Patient in [facility] mode at time of RD assessment. She has had daily weaning trials. Per discussion in rounds patient will not be able to go to [facility] as she has Medicaid. Plan is to assess her for vent/SNF facilities once she is appropriate.  Access: 16 Fr. NGT placed 5/16; terminates in  mid-stomach per abdominal x-ray 5/16; 64 cm at right nare  MAP: 66-104 mmHg  TF: pt tolerating Vital AF 1.2 at 45 mL/hr; according to pump patient received 992 mL tube feeds in the past 24 hrs (91.9% goal daily volume); pump is programmed for 100 mL free water flush every 6 hours while order is for 100 mL free water flush every 8 hours  Patient is currently intubated on ventilator support Ve: 7.3 L/min Temp (24hrs), Avg:99 F (37.2 C), Min:96.8 F (36 C), Max:100 F (37.8 C)  Propofol: N/A  Medications reviewed and include: Xanax, famotidine, free water 100 mL Q8hrs, Novolog 0-9 units Q4hrs, liquid MVI daily per tube, prednisone 40 mg daily per tube, senna-docusate, fentanyl gtt.  Labs reviewed: CBG 107-172 past 24 hrs, Chloride 98, BUN 21, Creatinine <0.3.  I/O: 2150 mL UOP yesterday (1.5 mL/kg/hr); 2 BM yesterday  Weight trend: 59.9 kg on 5/20; +14.2 kg from admission  Discussed with RN and on rounds. Patient has good UOP and regular bowel movements.  Diet Order:   Diet Order    None      EDUCATION NEEDS:   No education needs have been identified at this time  Skin:  Skin Assessment: Reviewed RN Assessment(closed incision to neck)  Last BM:  10/21/2017 - large type 6  Height:   Ht Readings from Last 1 Encounters:  10/01/17 4' 9"  (1.448 m)    Weight:   Wt Readings from Last 1 Encounters:  10/21/17 132 lb 0.9 oz (59.9 kg)    Ideal Body Weight:  43.2 kg  BMI:  Body mass  index is 28.58 kg/m.  Estimated Nutritional Needs:   Kcal:  1324 (PSU 2003b w/ MSJ 1053, Ve 7.4, Tmax 37.7)  Protein:  70-80 grams (1.5-1.8 grams/kg)  Fluid:  1.4-1.6 L/day (30-35 mL/kg)  Willey Blade, MS, RD, LDN Office: [phone] Pager: [phone] After Hours/Weekend Pager: [phone]

## 2017-10-21 NOTE — Progress Notes (Signed)
[place] [facility] [place] NAME: Michelle [name]    MR#:  [id number]  DATE OF BIRTH:  07-05-1960  SUBJECTIVE:  Patient seen and evaluated today On tracheostomy with ventilator support Received xanax this am  - Trach tube placed 10/17/17.   REVIEW OF SYSTEMS:  Review of Systems  Unable to perform ROS: Critical illness    DRUG ALLERGIES:   Allergies  Allergen Reactions  . Carvedilol Other (See Comments)    Pt gets "sick" when on medication    VITALS:  Blood pressure 99/60, pulse 91, temperature 99.3 F (37.4 C), resp. rate 18, height 4\' 9"  (1.448 m), weight 59.9 kg (132 lb 0.9 oz), last menstrual period 03/06/2005, SpO2 97 %.  PHYSICAL EXAMINATION:  Physical Exam  GENERAL:  57 y.o.-year-old patient lying in the bed with ventilator support EYES: Pupils equal, round, reactive to light and accommodation. No scleral icterus.  HEENT: Head atraumatic, normocephalic. Oropharynx and nasopharynx clear. has OG tube  NECK:  Supple, no jugular venous distention. No thyroid enlargement, no tendHrness.  Has tracheostomy LUNGS: Normal breath sounds bilaterally, no wheezing, rales,rhonchi or crepitation. No use of accessory muscles of respiration. Decreased bibasilar breath sounds Vent setting  Tidal volume 450 Rate 15 Peep 5 Fi02 30% CARDIOVASCULAR: S1, S2 normal. No murmurs, rubs, or gallops.  ABDOMEN: Soft, nontender, nondistended.  Hypoactive bowel sounds present. No organomegaly or mass.  EXTREMITIES: No cyanosis, or clubbing. 1-2+ edema of hands and feet edema NEUROLOGIC: Awake, responds to loud verbal commands Not completely oriented to time, place and person PSYCHIATRIC: Unable to assess completely SKIN: No obvious rash, lesion, or ulcer.    LABORATORY PANEL:   CBC Recent Labs  Lab 10/19/17 0544  WBC 7.0  HGB 9.2*  HCT 28.8*  PLT 162    ------------------------------------------------------------------------------------------------------------------  Chemistries  Recent Labs  Lab 10/21/17 0417  NA 135  K 4.0  CL 98*  CO2 31  GLUCOSE 122*  BUN 21*  CREATININE <0.30*  CALCIUM 8.4*   ------------------------------------------------------------------------------------------------------------------  Cardiac Enzymes No results for input(s): TROPONINI in the last 168 hours. ------------------------------------------------------------------------------------------------------------------  RADIOLOGY:  No results found.  EKG:   Orders placed or performed during the hospital encounter of 09/30/17  . EKG 12-Lead  . EKG 12-Lead  . EKG 12-Lead  . EKG 12-Lead  . EKG 12-Lead  . EKG 12-Lead  . EKG 12-Lead  . EKG 12-Lead    ASSESSMENT AND PLAN:   [name] a57 y.o.femalewith a known history of anxiety, CHF, cocaine abuse, COPD, coronary artery disease, hepatitis C, hypertension admitted for acute respiratory failure and intubated.  1.  Acute on chronic respiratory failure with hypoxia-secondary to acute on chronic COPD exacerbation\ -On tracheostomy with ventilator support -Continue Vent bundle Weaning trials as per ICU attending -Appreciate pulmonary management  -On steroids, nebs.   2.  Hypotension improved Off pressor meds  3.  Anxiety-continue Xanax scheduled with decreased dose  4.  DVT prophylaxis-subcutaneous heparin  5. Low grade fever improved 1 set of blood culture is positive for staph species Patient started on vancomycin antibiotic intravenously empirically Chest Xray reviewed no pneumonia  6. DC fentanyl drip  All the records are reviewed and case discussed with Care Management/Social Workerr. Management plans discussed with the patient, family and they are in agreement.  CODE STATUS: Full Code  TOTAL TIME TAKING CARE OF THIS PATIENT: 34 minutes.   POSSIBLE D/C IN ? DAYS,  DEPENDING ON CLINICAL CONDITION.   Michelle [name] M.D  on 10/21/2017 at 11:12 AM  Between 7am to 6pm - Pager - [phone]  After 6pm go to [email or web address] - password [email or web address] [facility]  Office  [phone]  CC: Primary care physician; Center, [facility]

## 2017-10-21 NOTE — Progress Notes (Deleted)
[employer] Case # [id number] Son is in route from [place] to the states.

## 2017-10-22 ENCOUNTER — Inpatient Hospital Stay: Payer: Medicaid Other

## 2017-10-22 LAB — GLUCOSE, CAPILLARY
GLUCOSE-CAPILLARY: 111 mg/dL — AB (ref 65–99)
GLUCOSE-CAPILLARY: 147 mg/dL — AB (ref 65–99)
GLUCOSE-CAPILLARY: 195 mg/dL — AB (ref 65–99)
Glucose-Capillary: 103 mg/dL — ABNORMAL HIGH (ref 65–99)
Glucose-Capillary: 122 mg/dL — ABNORMAL HIGH (ref 65–99)
Glucose-Capillary: 175 mg/dL — ABNORMAL HIGH (ref 65–99)

## 2017-10-22 LAB — CBC WITH DIFFERENTIAL/PLATELET
Basophils Absolute: 0 10*3/uL (ref 0–0.1)
Basophils Relative: 0 %
EOS PCT: 0 %
Eosinophils Absolute: 0 10*3/uL (ref 0–0.7)
HCT: 26.9 % — ABNORMAL LOW (ref 35.0–47.0)
Hemoglobin: 8.8 g/dL — ABNORMAL LOW (ref 12.0–16.0)
LYMPHS ABS: 0.5 10*3/uL — AB (ref 1.0–3.6)
LYMPHS PCT: 6 %
MCH: 29.5 pg (ref 26.0–34.0)
MCHC: 32.7 g/dL (ref 32.0–36.0)
MCV: 90 fL (ref 80.0–100.0)
MONO ABS: 0.5 10*3/uL (ref 0.2–0.9)
MONOS PCT: 7 %
Neutro Abs: 6.7 10*3/uL — ABNORMAL HIGH (ref 1.4–6.5)
Neutrophils Relative %: 87 %
PLATELETS: 170 10*3/uL (ref 150–440)
RBC: 2.99 MIL/uL — AB (ref 3.80–5.20)
RDW: 16.4 % — ABNORMAL HIGH (ref 11.5–14.5)
WBC: 7.8 10*3/uL (ref 3.6–11.0)

## 2017-10-22 LAB — BASIC METABOLIC PANEL
Anion gap: 6 (ref 5–15)
BUN: 26 mg/dL — ABNORMAL HIGH (ref 6–20)
CHLORIDE: 97 mmol/L — AB (ref 101–111)
CO2: 32 mmol/L (ref 22–32)
CREATININE: 0.36 mg/dL — AB (ref 0.44–1.00)
Calcium: 8.5 mg/dL — ABNORMAL LOW (ref 8.9–10.3)
GFR calc non Af Amer: >60 mL/min (ref >60)
Glucose, Bld: 197 mg/dL — ABNORMAL HIGH (ref 65–99)
Potassium: 4.3 mmol/L (ref 3.5–5.1)
Sodium: 135 mmol/L (ref 135–145)

## 2017-10-22 MED ORDER — LORAZEPAM 2 MG/ML IJ SOLN
2.0000 mg | INTRAMUSCULAR | Status: DC | PRN
  Administered 2017-10-22 – 2017-10-31 (×39): 2 mg via INTRAVENOUS
  Filled 2017-10-22 (×40): qty 1

## 2017-10-22 MED ORDER — FENTANYL CITRATE (PF) 100 MCG/2ML IJ SOLN
50.0000 ug | INTRAMUSCULAR | Status: DC | PRN
  Administered 2017-10-22 – 2017-10-27 (×31): 50 ug via INTRAVENOUS
  Filled 2017-10-22 (×29): qty 2

## 2017-10-22 MED ORDER — FENTANYL CITRATE (PF) 100 MCG/2ML IJ SOLN
INTRAMUSCULAR | Status: AC
  Administered 2017-10-22: 50 ug via INTRAVENOUS
  Filled 2017-10-22: qty 2

## 2017-10-22 MED ORDER — LORAZEPAM 2 MG/ML IJ SOLN
INTRAMUSCULAR | Status: AC
  Administered 2017-10-22: 2 mg via INTRAVENOUS
  Filled 2017-10-22: qty 1

## 2017-10-22 NOTE — Progress Notes (Addendum)
[facility] at [place] NAME: Michelle [name]    MR#:  [id number]  DATE OF BIRTH:  [birth year]  SUBJECTIVE:  Patient seen and evaluated today On tracheostomy with ventilator support Short of breath this morning Failed pressure support ventilation FiO2 was increased back to 30%  - Trach tube placed 10/17/17.   REVIEW OF SYSTEMS:  Review of Systems  Unable to perform ROS: Critical illness    DRUG ALLERGIES:   Allergies  Allergen Reactions  . Carvedilol Other (See Comments)    Pt gets "sick" when on medication    VITALS:  Blood pressure 105/68, pulse (!) 108, temperature 100.2 F (37.9 C), temperature source Bladder, resp. rate (!) 25, height 4\' 9"  (1.448 m), weight 57.9 kg (127 lb 10.3 oz), last menstrual period 03/06/2005, SpO2 96 %.  PHYSICAL EXAMINATION:  Physical Exam  GENERAL:  57 y.o.-year-old patient lying in the bed with ventilator support EYES: Pupils equal, round, reactive to light and accommodation. No scleral icterus.  HEENT: Head atraumatic, normocephalic. Oropharynx and nasopharynx clear. has OG tube  NECK:  Supple, no jugular venous distention. No thyroid enlargement, no tendHrness.  Has tracheostomy LUNGS: Decreased breath sounds bilaterally, no wheezing, rales,rhonchi or crepitation. No use of accessory muscles of respiration. Decreased bibasilar breath sounds Vent setting  Tidal volume 450 Rate 15 Peep 5 Fi02 30% CARDIOVASCULAR: S1, S2 normal. No murmurs, rubs, or gallops.  ABDOMEN: Soft, nontender, nondistended.  Hypoactive bowel sounds present. No organomegaly or mass.  EXTREMITIES: No cyanosis, or clubbing. 1-2+ edema of hands and feet edema NEUROLOGIC: Awake, responds to loud verbal commands Not completely oriented to time, place and person PSYCHIATRIC: Unable to assess completely SKIN: No obvious rash, lesion, or ulcer.    LABORATORY PANEL:   CBC Recent Labs  Lab 10/22/17 0543  WBC 7.8  HGB 8.8*    HCT 26.9*  PLT 170   ------------------------------------------------------------------------------------------------------------------  Chemistries  Recent Labs  Lab 10/21/17 0417  NA 135  K 4.0  CL 98*  CO2 31  GLUCOSE 122*  BUN 21*  CREATININE <0.30*  CALCIUM 8.4*   ------------------------------------------------------------------------------------------------------------------  Cardiac Enzymes No results for input(s): TROPONINI in the last 168 hours. ------------------------------------------------------------------------------------------------------------------  RADIOLOGY:  Dg Chest Port 1 View  Result Date: 10/22/2017 CLINICAL DATA:  Severe shortness of breath in a patient with a history of COPD. EXAM: PORTABLE CHEST 1 VIEW COMPARISON:  A single view of the chest 10/19/2016 and 10/17/2016. FINDINGS: Tracheostomy tube, NG tube and right PICC are unchanged. The lungs are emphysematous but clear. Heart size is normal. No pneumothorax or pleural effusion. IMPRESSION: No acute disease. Support apparatus projects in good position. Emphysema. Electronically Signed   By: Inge Rise M.D.   On: 10/22/2017 11:14    EKG:   Orders placed or performed during the hospital encounter of 09/30/17  . EKG 12-Lead  . EKG 12-Lead  . EKG 12-Lead  . EKG 12-Lead  . EKG 12-Lead  . EKG 12-Lead  . EKG 12-Lead  . EKG 12-Lead    ASSESSMENT AND PLAN:   [name] a57 y.o.femalewith a known history of anxiety, CHF, cocaine abuse, COPD, coronary artery disease, hepatitis C, hypertension admitted for acute respiratory failure and intubated.  1.  Acute on chronic respiratory failure with persistent hypoxia-secondary to acute on chronic COPD exacerbation\ -On tracheostomy with ventilator support -Continue Vent bundle Failed pressure support  Weaning trials as per ICU attending -Appreciate pulmonary management  -On steroids, nebs.   2.  Hypotension improved Off pressor  meds  3.  Anxiety-continue Xanax scheduled with decreased dose  4.  DVT prophylaxis-subcutaneous heparin  5. Low grade fever improved 1 set of blood culture is positive for staph species Patient started on vancomycin antibiotic intravenously empirically Chest Xray today revealed emphysema but no infectious process  6.  Off fentanyl drip  7.  Nutrition via NG tube feedings  All the records are reviewed and case discussed with Care Management/Social Workerr. Management plans discussed with the patient, family and they are in agreement.  CODE STATUS: Full Code  TOTAL TIME TAKING CARE OF THIS PATIENT: 34 minutes.   POSSIBLE D/C IN ? DAYS, DEPENDING ON CLINICAL CONDITION.   Saundra Shelling M.D on 10/22/2017 at 1:34 PM  Between 7am to 6pm - Pager - [phone]  After 6pm go to [email or web address] - password [email or web address] [facility]  Office  [phone]  CC: Primary care physician; Center, [facility]

## 2017-10-22 NOTE — Progress Notes (Signed)
Pharmacy Consult:  Pharmacy consulted to assist with constipation management for 57 yo female admitted with COPD exacerbation requiring mechanical ventilation and sedation with fentanyl.  Plan: Continued senna/docusate 1 tab BID.   Pharmacy will continue to monitor and adjust per consult.   Mercury Rock L,  10/22/2017 4:07 PM

## 2017-10-22 NOTE — Progress Notes (Signed)
Physical Therapy Treatment Patient Details Name: Michelle [name] MRN: [id number] DOB: 1960/08/07 Today's Date: 10/22/2017    History of Present Illness Pt is a 57 y/o F who presented with severe SOB with COPD exacerbation.  Chest x-ray was clear, patient was slightly hypotensive after intubation, admitted to the ICU.  Trach tube placed 5/16.  Pt's PMH includes cocaine abuse, COPD, CAD, hepatitis C, CHF.      PT Comments    Discussed with primary RN prior to session.  OK for supine exercises this am.  Participated in exercises as described below.  Pt presents with global weakness.  Pt is motivated to participate in session and was appropriately engaged, she was only able to assist with trace active movements in BLE.  She was able to tolerate 2 sets on RLE but was becoming fatigued and only tolerated 1 set on LLE.  Vitals remained stable during session.   Follow Up Recommendations  SNF     Equipment Recommendations       Recommendations for Other Services       Precautions / Restrictions Precautions Precautions: Fall;Other (comment) Precaution Comments: trach, NG tube, PICC line Restrictions Weight Bearing Restrictions: No    Mobility  Bed Mobility               General bed mobility comments: Unable to attempt this session due to weakness and lethargy  Transfers                    Ambulation/Gait                 Stairs             Wheelchair Mobility    Modified Rankin (Stroke Patients Only)       Balance                                            Cognition Arousal/Alertness: Awake/alert;Lethargic Behavior During Therapy: WFL for tasks assessed/performed Overall Cognitive Status: Difficult to assess                                        Exercises Other Exercises Other Exercises: supine P/minimal AAROM BLE ankle pumps, heel slides, ab/add, SAQ, and SLR 2 x 10 RLE, x 10 LLE due to fatigue     General Comments        Pertinent Vitals/Pain Pain Assessment: No/denies pain    Home Living                      Prior Function            PT Goals (current goals can now be found in the care plan section) Progress towards PT goals: Progressing toward goals    Frequency    Min 2X/week      PT Plan Current plan remains appropriate    Co-evaluation              AM-PAC PT "6 Clicks" Daily Activity  Outcome Measure  Difficulty turning over in bed (including adjusting bedclothes, sheets and blankets)?: Unable Difficulty moving from lying on back to sitting on the side of the bed? : Unable Difficulty sitting down on and standing up from a chair with arms (e.g., wheelchair, bedside  commode, etc,.)?: Unable Help needed moving to and from a bed to chair (including a wheelchair)?: Total Help needed walking in hospital room?: Total Help needed climbing 3-5 steps with a railing? : Total 6 Click Score: 6    End of Session   Activity Tolerance: Patient limited by lethargy;Patient limited by fatigue Patient left: in bed;with call bell/phone within reach Nurse Communication: Other (comment)       Time: 3736-6815 PT Time Calculation (min) (ACUTE ONLY): 23 min  Charges:  $Therapeutic Exercise: 23-37 mins                    G Codes:       Chesley Noon, PTA 10/22/17, 10:03 AM

## 2017-10-22 NOTE — Clinical Social Work Note (Signed)
S[facility] Ball[facility] at [facility] can take patient but stated the trach will need to be 30-7 days old prior to taking. CSW will speak with family today to let them know and to also let them know that Sonia Baller will need to begin the medicaid application process for [place] prior to patient being able to come. Shela Leff MSW,LCSW [phone]

## 2017-10-22 NOTE — Progress Notes (Signed)
   CHIEF COMPLAINT:   Chief Complaint  Patient presents with  . Shortness of Breath    Subjective  Remains critically ill On full vent support Increased WOB and using accessory muscles to breathe   CXR pending  Objective   Examination:  PHYSICAL EXAMINATION:  GENERAL:critically ill appearing, +resp distress HEAD: Normocephalic, atraumatic.  EYES: Pupils equal, round, reactive to light.  No scleral icterus.  MOUTH: Moist mucosal membrane. NECK: Supple. No thyromegaly. No nodules. No JVD.  PULMONARY: +rhonchi, +wheezing CARDIOVASCULAR: S1 and S2. Regular rate and rhythm. No murmurs, rubs, or gallops.  GASTROINTESTINAL: Soft, nontender, -distended. No masses. Positive bowel sounds. No hepatosplenomegaly.  MUSCULOSKELETAL: No swelling, clubbing, or edema.  NEUROLOGIC: obtunded SKIN:intact,warm,dry   VITALS:  height is 4\' 9"  (1.448 m) and weight is 127 lb 10.3 oz (57.9 kg). Her temperature is 99.7 F (37.6 C). Her blood pressure is 105/64 and her pulse is 96. Her respiration is 21 (abnormal) and oxygen saturation is 99%.   I personally reviewed Labs under Results section.     IMPRESSION: Acute/chronic respiratory failure Very severe COPD with acute exacerbation Prolonged mechanical ventilation Trach tube status - placed 05/16 Intermittent, severe agitation  Positive blood culture-contaminant   Acute and severe resp failure s/p trach requriing full vent support With evidence of sepsis  Failure to wean from vent-placed back on PRVC mode Sedatives as needed Continue TF's DVT/GI prx NEB THERAPY STEROID therapy     Critical Care Time devoted to patient care services described in this note is 98minutes.   Overall, patient is critically ill, prognosis is guarded.  Patient with Multiorgan failure and at high risk for cardiac arrest and death.    Corrin Parker, M.D.  [facility]  Medical Director [place] Director [facility] Cardio-Pulmonary Department

## 2017-10-23 ENCOUNTER — Inpatient Hospital Stay: Payer: Medicaid Other

## 2017-10-23 DIAGNOSIS — L8990 Pressure ulcer of unspecified site, unspecified stage: Secondary | ICD-10-CM

## 2017-10-23 LAB — GLUCOSE, CAPILLARY
GLUCOSE-CAPILLARY: 127 mg/dL — AB (ref 65–99)
Glucose-Capillary: 133 mg/dL — ABNORMAL HIGH (ref 65–99)
Glucose-Capillary: 110 mg/dL — ABNORMAL HIGH (ref 65–99)
Glucose-Capillary: 151 mg/dL — ABNORMAL HIGH (ref 65–99)
Glucose-Capillary: 129 mg/dL — ABNORMAL HIGH (ref 65–99)

## 2017-10-23 LAB — BASIC METABOLIC PANEL
Anion gap: 8 (ref 5–15)
BUN: 23 mg/dL — ABNORMAL HIGH (ref 6–20)
CALCIUM: 8.9 mg/dL (ref 8.9–10.3)
CO2: 31 mmol/L (ref 22–32)
Chloride: 98 mmol/L — ABNORMAL LOW (ref 101–111)
GLUCOSE: 120 mg/dL — AB (ref 65–99)
Potassium: 3.8 mmol/L (ref 3.5–5.1)
Sodium: 137 mmol/L (ref 135–145)

## 2017-10-23 LAB — CBC WITH DIFFERENTIAL/PLATELET
BASOS PCT: 1 %
Basophils Absolute: 0 10*3/uL (ref 0–0.1)
EOS ABS: 0 10*3/uL (ref 0–0.7)
Eosinophils Relative: 0 %
HCT: 26.9 % — ABNORMAL LOW (ref 35.0–47.0)
Hemoglobin: 8.9 g/dL — ABNORMAL LOW (ref 12.0–16.0)
Lymphocytes Relative: 3 %
Lymphs Abs: 0.3 10*3/uL — ABNORMAL LOW (ref 1.0–3.6)
MCH: 29.6 pg (ref 26.0–34.0)
MCHC: 33 g/dL (ref 32.0–36.0)
MCV: 89.8 fL (ref 80.0–100.0)
MONO ABS: 0.6 10*3/uL (ref 0.2–0.9)
MONOS PCT: 6 %
NEUTROS PCT: 90 %
Neutro Abs: 7.8 10*3/uL — ABNORMAL HIGH (ref 1.4–6.5)
Platelets: 181 10*3/uL (ref 150–440)
RBC: 3 MIL/uL — ABNORMAL LOW (ref 3.80–5.20)
RDW: 16.4 % — AB (ref 11.5–14.5)
WBC: 8.7 10*3/uL (ref 3.6–11.0)

## 2017-10-23 LAB — CULTURE, BLOOD (ROUTINE X 2)
Culture: NO GROWTH
Special Requests: ADEQUATE

## 2017-10-23 LAB — PROCALCITONIN

## 2017-10-23 MED ORDER — LEVETIRACETAM IN NACL 500 MG/100ML IV SOLN: 500.0000 mg | Freq: Two times a day (BID) | INTRAVENOUS | Status: DC

## 2017-10-23 MED ORDER — ESCITALOPRAM OXALATE 10 MG PO TABS
10.0000 mg | ORAL_TABLET | Freq: Every day | ORAL | Status: DC
  Administered 2017-10-23 – 2017-10-29 (×7): 10 mg
  Filled 2017-10-23 (×7): qty 1

## 2017-10-23 MED ORDER — STERILE WATER FOR INJECTION IJ SOLN
INTRAMUSCULAR | Status: AC
  Administered 2017-10-23: 10 mL
  Filled 2017-10-23: qty 10

## 2017-10-23 MED ORDER — QUETIAPINE FUMARATE 25 MG PO TABS
25.0000 mg | ORAL_TABLET | Freq: Once | ORAL | Status: AC
  Administered 2017-10-23: 25 mg
  Filled 2017-10-23: qty 1

## 2017-10-23 MED ORDER — METHYLPREDNISOLONE SODIUM SUCC 125 MG IJ SOLR
INTRAMUSCULAR | Status: AC
  Filled 2017-10-23: qty 2

## 2017-10-23 MED ORDER — VECURONIUM BROMIDE 10 MG IV SOLR
INTRAVENOUS | Status: AC
  Administered 2017-10-23: 10 mg via INTRAVENOUS
  Filled 2017-10-23: qty 10

## 2017-10-23 MED ORDER — SCOPOLAMINE 1 MG/3DAYS TD PT72
1.0000 | MEDICATED_PATCH | TRANSDERMAL | Status: DC
  Administered 2017-10-23 – 2017-11-10 (×7): 1.5 mg via TRANSDERMAL
  Filled 2017-10-23 (×7): qty 1

## 2017-10-23 MED ORDER — SODIUM CHLORIDE 0.9 % IV SOLN
Freq: Two times a day (BID) | INTRAVENOUS | Status: AC
  Administered 2017-10-24 – 2017-11-06 (×27): via INTRAVENOUS
  Filled 2017-10-23 (×2): qty 5
  Filled 2017-10-23: qty 500
  Filled 2017-10-23 (×2): qty 5
  Filled 2017-10-23 (×2): qty 500
  Filled 2017-10-23: qty 5
  Filled 2017-10-23: qty 500
  Filled 2017-10-23: qty 5
  Filled 2017-10-23: qty 500
  Filled 2017-10-23 (×2): qty 5
  Filled 2017-10-23 (×4): qty 500
  Filled 2017-10-23: qty 5
  Filled 2017-10-23: qty 500
  Filled 2017-10-23 (×2): qty 5
  Filled 2017-10-23 (×2): qty 500
  Filled 2017-10-23: qty 5
  Filled 2017-10-23: qty 500
  Filled 2017-10-23 (×3): qty 5

## 2017-10-23 MED ORDER — LEVETIRACETAM IN NACL 1000 MG/100ML IV SOLN
1000.0000 mg | INTRAVENOUS | Status: AC
  Administered 2017-10-23: 1000 mg via INTRAVENOUS
  Filled 2017-10-23: qty 100

## 2017-10-23 MED ORDER — METHYLPREDNISOLONE SODIUM SUCC 125 MG IJ SOLR
60.0000 mg | INTRAMUSCULAR | Status: AC
  Administered 2017-10-23: 60 mg via INTRAVENOUS

## 2017-10-23 MED ORDER — METHYLPREDNISOLONE SODIUM SUCC 40 MG IJ SOLR
40.0000 mg | Freq: Two times a day (BID) | INTRAMUSCULAR | Status: DC
  Administered 2017-10-24 – 2017-10-29 (×11): 40 mg via INTRAVENOUS
  Filled 2017-10-23 (×11): qty 1

## 2017-10-23 MED ORDER — VECURONIUM BROMIDE 10 MG IV SOLR
10.0000 mg | Freq: Once | INTRAVENOUS | Status: AC
  Administered 2017-10-23: 10 mg via INTRAVENOUS

## 2017-10-23 MED ORDER — QUETIAPINE FUMARATE 25 MG PO TABS
50.0000 mg | ORAL_TABLET | Freq: Every day | ORAL | Status: DC
  Administered 2017-10-23 – 2017-11-04 (×13): 50 mg
  Filled 2017-10-23 (×13): qty 2

## 2017-10-23 NOTE — Clinical Social Work Note (Signed)
CSW spoke with patient's husband this afternoon via phone and informed him that [facility] in [place] can take patient and informed him that someone would be contacting him to complete medicaid paperwork. He verbalized understanding. Shela Leff MSW,LCSW [phone]

## 2017-10-23 NOTE — Progress Notes (Signed)
Pharmacy Consult:  Pharmacy consulted to assist with constipation management for 57 yo female admitted with COPD exacerbation requiring mechanical ventilation and sedation with fentanyl.  Plan: Last bowel movement on 5/22. Continued senna/docusate 1 tab BID.   Pharmacy will continue to monitor and adjust per consult.   Simpson,Michael L,  10/23/2017 3:42 PM

## 2017-10-23 NOTE — Progress Notes (Signed)
[facility] at [place] NAME: Michelle [name]    MR#:  [id number]  DATE OF BIRTH:  11-25-1960  SUBJECTIVE:  Patient seen and evaluated today On tracheostomy with ventilator support Failed pressure support ventilation Currently on 24% FiO2  - Trach tube placed 10/17/17.   REVIEW OF SYSTEMS:  Review of Systems  Unable to perform ROS: Critical illness    DRUG ALLERGIES:   Allergies  Allergen Reactions  . Carvedilol Other (See Comments)    Pt gets "sick" when on medication    VITALS:  Blood pressure (!) 143/94, pulse (!) 110, temperature 99.5 F (37.5 C), resp. rate 15, height 4\' 9"  (1.448 m), weight 57.6 kg (126 lb 15.8 oz), last menstrual period 03/06/2005, SpO2 94 %.  PHYSICAL EXAMINATION:  Physical Exam  GENERAL:  57 y.o.-year-old patient lying in the bed with ventilator support EYES: Pupils equal, round, reactive to light and accommodation. No scleral icterus.  HEENT: Head atraumatic, normocephalic. Oropharynx and nasopharynx clear. has OG tube  NECK:  Supple, no jugular venous distention. No thyroid enlargement, no tendHrness.  Has tracheostomy LUNGS: Decreased breath sounds bilaterally, no wheezing, rales,rhonchi or crepitation. No use of accessory muscles of respiration. Decreased bibasilar breath sounds Vent setting  Tidal volume 450 Rate 15 Peep 5 Fi02 24% CARDIOVASCULAR: S1, S2 normal. No murmurs, rubs, or gallops.  ABDOMEN: Soft, nontender, nondistended.  Hypoactive bowel sounds present. No organomegaly or mass.  EXTREMITIES: No cyanosis, or clubbing. 1-2+ edema of hands and feet edema NEUROLOGIC: Awake, responds to loud verbal commands Not completely oriented to time, place and person PSYCHIATRIC: Unable to assess completely SKIN: No obvious rash, lesion, or ulcer.    LABORATORY PANEL:   CBC Recent Labs  Lab 10/23/17 0353  WBC 8.7  HGB 8.9*  HCT 26.9*  PLT 181    ------------------------------------------------------------------------------------------------------------------  Chemistries  Recent Labs  Lab 10/23/17 0353  NA 137  K 3.8  CL 98*  CO2 31  GLUCOSE 120*  BUN 23*  CREATININE <0.30*  CALCIUM 8.9   ------------------------------------------------------------------------------------------------------------------  Cardiac Enzymes No results for input(s): TROPONINI in the last 168 hours. ------------------------------------------------------------------------------------------------------------------  RADIOLOGY:  Dg Chest Port 1 View  Result Date: 10/22/2017 CLINICAL DATA:  Severe shortness of breath in a patient with a history of COPD. EXAM: PORTABLE CHEST 1 VIEW COMPARISON:  A single view of the chest 10/19/2016 and 10/17/2016. FINDINGS: Tracheostomy tube, NG tube and right PICC are unchanged. The lungs are emphysematous but clear. Heart size is normal. No pneumothorax or pleural effusion. IMPRESSION: No acute disease. Support apparatus projects in good position. Emphysema. Electronically Signed   By: Inge Rise M.D.   On: 10/22/2017 11:14    EKG:   Orders placed or performed during the hospital encounter of 09/30/17  . EKG 12-Lead  . EKG 12-Lead  . EKG 12-Lead  . EKG 12-Lead  . EKG 12-Lead  . EKG 12-Lead  . EKG 12-Lead  . EKG 12-Lead    ASSESSMENT AND PLAN:   Michelle [name] a57 y.o.femalewith a known history of anxiety, CHF, cocaine abuse, COPD, coronary artery disease, hepatitis C, hypertension admitted for acute respiratory failure and intubated.  1.  Acute on chronic respiratory failure with persistent hypoxia and hypercapnia-secondary to acute on chronic COPD exacerbation -On tracheostomy with ventilator support -Continue Vent bundle Failed pressure support  Unable to wean ventilator -Appreciate pulmonary management  -On steroids, nebs.   2.  Hypotension improved Off pressor meds  3.   Anxiety-continue  Xanax scheduled with decreased dose  4.  DVT prophylaxis-subcutaneous heparin  6.  Off fentanyl drip Off all antibiotics  7.  Nutrition via NG tube feedings  8.Long term prognosis poor   All the records are reviewed and case discussed with Care Management/Social Workerr. Management plans discussed with the patient, family and they are in agreement.  CODE STATUS: Full Code  TOTAL TIME TAKING CARE OF THIS PATIENT: 34 minutes.   POSSIBLE D/C IN ? DAYS, DEPENDING ON CLINICAL CONDITION.   Saundra Shelling M.D on 10/23/2017 at 2:32 PM  Between 7am to 6pm - Pager - [phone]  After 6pm go to [email or web address] - password [email or web address] [facility]  Office  [phone]  CC: Primary care physician; Center, [facility]

## 2017-10-23 NOTE — Progress Notes (Signed)
CRITICAL CARE NOTE  CC  follow up hypercapnic and hypoxic respiratory failure  SUBJECTIVE Patient remains critically ill Prognosis is guarded Severe resp failure remains on vent     SIGNIFICANT EVENTS    BP 131/83   Pulse (!) 113   Temp 99.9 F (37.7 C)   Resp 19   Ht 4\' 9"  (1.448 m)   Wt 126 lb 15.8 oz (57.6 kg)   LMP 03/06/2005 (Approximate)   SpO2 96%   BMI 27.48 kg/m    REVIEW OF SYSTEMS  PATIENT IS UNABLE TO PROVIDE COMPLETE REVIEW OF SYSTEM S DUE TO SEVERE CRITICAL ILLNESS AND ENCEPHALOPATHY   PHYSICAL EXAMINATION:  GENERAL:critically ill appearing, +resp distress HEAD: Normocephalic, atraumatic.  EYES: Pupils equal, round, reactive to light.  No scleral icterus.  MOUTH: Moist mucosal membrane. NECK: Supple. No thyromegaly. No nodules. No JVD.  PULMONARY: +rhonchi, +wheezing CARDIOVASCULAR: S1 and S2. Regular rate and rhythm. No murmurs, rubs, or gallops.  GASTROINTESTINAL: Soft, nontender, -distended. No masses. Positive bowel sounds. No hepatosplenomegaly.  MUSCULOSKELETAL: No swelling, clubbing, or edema.  NEUROLOGIC: obtunded, GCS<8 SKIN:intact,warm,dry  ASSESSMENT AND PLAN Acute/chronic respiratory failure Very severe COPD with acute exacerbation Prolonged mechanical ventilation Trach tube status - placed 05/16 Intermittent, severe agitation  Positive blood culture-contaminant   Acute and severe resp failure s/p trach requriing full vent support With evidence of sepsis   Failure to wean from vent-placed back on PRVC mode Sedatives as needed Continue TF's DVT/GI prx NEB THERAPY STEROID therapy    Severe Hypoxic and Hypercapnic Respiratory Failure -continue Full MV support -continue Bronchodilator Therapy -Wean Fio2 and PEEP as tolerated   DVT/GI PRX ordered TRANSFUSIONS AS NEEDED MONITOR FSBS ASSESS the need for LABS as needed   Critical Care Time devoted to patient care services described in this note is 32 minutes.    Overall, patient is critically ill, prognosis is guarded.  high risk for cardiac arrest and death.    Corrin Parker, M.D.  [facility]  Medical Director [place] Director [facility] Cardio-Pulmonary Department

## 2017-10-24 ENCOUNTER — Inpatient Hospital Stay: Payer: Medicaid Other

## 2017-10-24 ENCOUNTER — Other Ambulatory Visit: Payer: Self-pay | Admitting: Neurology

## 2017-10-24 ENCOUNTER — Other Ambulatory Visit: Payer: Medicaid Other

## 2017-10-24 DIAGNOSIS — R4182 Altered mental status, unspecified: Secondary | ICD-10-CM

## 2017-10-24 LAB — CBC WITH DIFFERENTIAL/PLATELET
BASOS ABS: 0 10*3/uL (ref 0–0.1)
Basophils Relative: 0 %
Eosinophils Absolute: 0 10*3/uL (ref 0–0.7)
Eosinophils Relative: 0 %
HCT: 24.2 % — ABNORMAL LOW (ref 35.0–47.0)
HEMOGLOBIN: 8 g/dL — AB (ref 12.0–16.0)
Lymphocytes Relative: 2 %
Lymphs Abs: 0.1 10*3/uL — ABNORMAL LOW (ref 1.0–3.6)
MCH: 29.5 pg (ref 26.0–34.0)
MCHC: 32.9 g/dL (ref 32.0–36.0)
MCV: 89.6 fL (ref 80.0–100.0)
MONO ABS: 0.1 10*3/uL — AB (ref 0.2–0.9)
MONOS PCT: 2 %
Neutro Abs: 6.4 10*3/uL (ref 1.4–6.5)
Neutrophils Relative %: 96 %
Platelets: 149 10*3/uL — ABNORMAL LOW (ref 150–440)
RBC: 2.7 MIL/uL — AB (ref 3.80–5.20)
RDW: 16.6 % — ABNORMAL HIGH (ref 11.5–14.5)
WBC: 6.8 10*3/uL (ref 3.6–11.0)

## 2017-10-24 LAB — CULTURE, BLOOD (ROUTINE X 2)
CULTURE: NO GROWTH
Culture: NO GROWTH
SPECIAL REQUESTS: ADEQUATE

## 2017-10-24 LAB — GLUCOSE, CAPILLARY
GLUCOSE-CAPILLARY: 157 mg/dL — AB (ref 65–99)
GLUCOSE-CAPILLARY: 143 mg/dL — AB (ref 65–99)
GLUCOSE-CAPILLARY: 120 mg/dL — AB (ref 65–99)
GLUCOSE-CAPILLARY: 146 mg/dL — AB (ref 65–99)
Glucose-Capillary: 138 mg/dL — ABNORMAL HIGH (ref 65–99)
Glucose-Capillary: 157 mg/dL — ABNORMAL HIGH (ref 65–99)
Glucose-Capillary: 179 mg/dL — ABNORMAL HIGH (ref 65–99)

## 2017-10-24 LAB — BASIC METABOLIC PANEL
Anion gap: 5 (ref 5–15)
BUN: 24 mg/dL — AB (ref 6–20)
CO2: 32 mmol/L (ref 22–32)
Calcium: 8.6 mg/dL — ABNORMAL LOW (ref 8.9–10.3)
Chloride: 98 mmol/L — ABNORMAL LOW (ref 101–111)
Creatinine, Ser: 0.31 mg/dL — ABNORMAL LOW (ref 0.44–1.00)
GFR calc non Af Amer: >60 mL/min (ref >60)
Glucose, Bld: 173 mg/dL — ABNORMAL HIGH (ref 65–99)
POTASSIUM: 3.9 mmol/L (ref 3.5–5.1)
SODIUM: 135 mmol/L (ref 135–145)

## 2017-10-24 NOTE — Progress Notes (Signed)
No charge note:   Palliative consult received:   Patient chart reviewed. Patient on full vent support, with trach, PEG. Attempted to call spouse for [place] meeting- voicemail full. Attempted to call daughterLajoyce Lauber- no answer, no voicemail. Per Carlota Raspberry is main contact. Sister- Freda Munro works here- but Pieter Partridge should be one to speak with. He usually answers phone. PMT will continue to try to contact spouse for [place].   Please call Palliative Medicine team phone with any questions [phone]. For individual providers please see AMION.  Mariana Kaufman, AGNP-C Palliative Medicine

## 2017-10-24 NOTE — Progress Notes (Signed)
   CHIEF COMPLAINT:   Chief Complaint  Patient presents with  . Shortness of Breath  acute hypoxic/hypercapnic resp failure   Subjective  Remains on vent,sedated Guppy breathing, increased WOB Obtunded MRI and EEG pending      Objective   Examination:  General exam: Appears calm and comfortable  Respiratory system: Clear to auscultation. Respiratory effort normal. HEENT: [place]/AT, PERRLA, no thrush, no stridor. Cardiovascular system: S1 & S2 heard, RRR. No JVD, murmurs, rubs, gallops or clicks. No pedal edema. Gastrointestinal system: Abdomen is nondistended, soft and nontender. No organomegaly or masses felt. Normal bowel sounds heard. Central nervous system: Alert and oriented. No focal neurological deficits. Extremities: Symmetric 5 x 5 power. Skin: No rashes, lesions or ulcers Psychiatry: Judgement and insight appear normal. Mood & affect appropriate.   VITALS:  height is 4\' 9"  (1.448 m) and weight is 128 lb 1.4 oz (58.1 kg). Her temperature is 99.7 F (37.6 C). Her blood pressure is 153/91 (abnormal) and her pulse is 101 (abnormal). Her respiration is 22 (abnormal) and oxygen saturation is 97%.   I personally reviewed Labs under Results section.  Radiology Reports Dg Chest Port 1 View  Result Date: 10/23/2017 CLINICAL DATA:  Acute respiratory failure EXAM: PORTABLE CHEST 1 VIEW COMPARISON:  10/22/2017 FINDINGS: Tracheostomy, right PICC line and NG tube remain in place, unchanged. There is hyperinflation of the lungs compatible with COPD. Heart is normal size. No confluent opacities or effusions. No acute bony abnormality. IMPRESSION: COPD.  No active disease. Electronically Signed   By: Rolm Baptise M.D.   On: 10/23/2017 19:45   Dg Chest Port 1 View  Result Date: 10/22/2017 CLINICAL DATA:  Severe shortness of breath in a patient with a history of COPD. EXAM: PORTABLE CHEST 1 VIEW COMPARISON:  A single view of the chest 10/19/2016 and 10/17/2016. FINDINGS: Tracheostomy  tube, NG tube and right PICC are unchanged. The lungs are emphysematous but clear. Heart size is normal. No pneumothorax or pleural effusion. IMPRESSION: No acute disease. Support apparatus projects in good position. Emphysema. Electronically Signed   By: Inge Rise M.D.   On: 10/22/2017 11:14       Assessment/Plan:  Acute/chronic respiratory failure Very severe COPD with acute exacerbation Prolonged mechanical ventilation Trach tube status - placed 05/16 Intermittent, severe agitation Complicated by acute neurological change Failure to wean from vent-placed back on PRVC mode Sedatives as needed     Severe Hypoxic and Hypercapnic Respiratory Failure -continue Full MV support -continue Bronchodilator Therapy -Wean Fio2 and PEEP as tolerated   NEUROLOGY Obtunded,sedated Needs MRI and EEG assess for seizures and stroke Keppra started  DVT/GI PRX ordered TRANSFUSIONS AS NEEDED MONITOR FSBS ASSESS the need for LABS as needed  Palliative care team consulted   Critical Care Time devoted to patient care services described in this note is 42 minutes.   Overall, patient is critically ill, prognosis is guarded.  Patient with Multiorgan failure and at high risk for cardiac arrest and death.    Corrin Parker, M.D.  [facility]  Medical Director [place] Director [facility] Cardio-Pulmonary Department

## 2017-10-24 NOTE — Progress Notes (Signed)
PT Cancellation Note  Patient Details Name: Michelle [name] MRN: [id number] DOB: 1960-11-18   Cancelled Treatment:    Reason Eval/Treat Not Completed: Fatigue/lethargy limiting ability to participate.  Per RN the pt is more lethargic today and with suspected seizure like activity.  Pending brain MRI.  Will hold PT until pt is medically cleared for PT treatment.    [name] PT, DPT 10/24/2017, 11:07 AM

## 2017-10-24 NOTE — Progress Notes (Signed)
Nutrition Follow-up  DOCUMENTATION CODES:   Non-severe (moderate) malnutrition in context of chronic illness  INTERVENTION:  Continue Vital AF 1.2 at 45 mL/hr (1080 mL goal daily volume) via NGT. Provides 1296 kcal, 81 grams of protein, 875 mL H2O daily.  Continue liquid MVI daily per tube.  With free water flush of 100 mL Q8hrs patient is receiving a total of 1175 mL H2O daily including water in tube feeding.  NUTRITION DIAGNOSIS:   Moderate Malnutrition related to chronic illness(COPD, CHF, hepatitis C, cocaine abuse) as evidenced by mild fat depletion, moderate fat depletion, mild muscle depletion, moderate muscle depletion.  Ongoing - addressing with TF regimen.  GOAL:   Provide needs based on ASPEN/SCCM guidelines  Met with TF regimen.  MONITOR:   Vent status, Labs, Weight trends, TF tolerance, I & O's  REASON FOR ASSESSMENT:   Ventilator    ASSESSMENT:   57 year old female with PMHx of emphysema, COPD, HTN, hypercholesterolemia, hx pneumothorax s/p chest tube insertion, CAD s/p cardiac catheterization, cocaine abuse, tobacco abuse, asthma, CHF, anxiety, hepatitis C admitted with acute exacerbation of COPD, acute on chronic respiratory failure requiring intubation 4/29, PNA, cocaine intoxication, AKI due to renal hypoperfusion.   -Patient is s/p tracheostomy tube placement on 5/16. OGT was replaced with NGT.  Patient remains on mechanical ventilation through tracheostomy tube. Continues to fail vent weaning. Plan is for PMT consult to discuss goals of care. Depending on outcome of PMT consult a GI consult will be placed for PEG tube.  Access: 16 Fr. NGT placed 5/16; terminates in mid-stomach per abdominal x-ray 5/16; 64 cm at right nare  TF: pt continues to tolerate Vital AF 1.2 at 45 mL/hr; per pump history she received 1031 mL tube feeds in the past 24 hrs (95.5% goal daily volume)  Patient is currently intubated on ventilator support Ve: 9 L/min Temp (24hrs),  Avg:100.2 F (37.9 C), Min:99.1 F (37.3 C), Max:101.7 F (38.7 C)  Propofol: N/A  Medications reviewed and include: Xanax, famotidine, fentanyl patch, free water flush 100 mL Q8hrs, Novolog 0-9 units Q4hrs, Solu-Medrol 40 mg Q12hrs IV, liquid MVI daily per tube, Seroquel, senna-docusate, Keppra.  Labs reviewed: CBG 143-179, Chloride 98, BUN 24, Creatinine 0.31.  I/O: 1525 mL UOP yesterday (1.1 mL/kg/hr); 1 BM  Weight trend: 58.1 kg on 5/23; +12.4 kg from admission  Discussed with RN and on rounds.  Diet Order:   Diet Order    None      EDUCATION NEEDS:   No education needs have been identified at this time  Skin:  Skin Assessment: Skin Integrity Issues: Skin Integrity Issues:: Incisions, Stage II Stage II: buttocks Incisions: closed incision to neck  Last BM:  10/23/2017 - medium type 6  Height:   Ht Readings from Last 1 Encounters:  10/01/17 4' 9"  (1.448 m)    Weight:   Wt Readings from Last 1 Encounters:  10/24/17 128 lb 1.4 oz (58.1 kg)    Ideal Body Weight:  43.2 kg  BMI:  Body mass index is 27.72 kg/m.  Estimated Nutritional Needs:   Kcal:  1150-1370 (25-30 kcal/kg)  Protein:  70-80 grams (1.5-1.8 grams/kg)  Fluid:  1.4-1.6 L/day (30-35 mL/kg)  Willey Blade, MS, RD, LDN Office: [phone] Pager: [phone] After Hours/Weekend Pager: [phone]

## 2017-10-24 NOTE — Procedures (Signed)
ELECTROENCEPHALOGRAM REPORT   Patient: Michelle [name]       Room #: IC18A-AA EEG No. ID: [id number] Age: 57 y.o.        Sex: female Referring Physician: Kasa Report Date:  10/24/2017        Interpreting Physician: Alexis Goodell  History: [name] is an 57 y.o. female s/p respiratory failure with altered mental status  Medications:  Xanax, ASA, Pulmicort, Lexapro, Pepcid, Duragesic, Insulin, Keppra, Solumedrol, Seroquel, Senokot  Conditions of Recording:  This is a 16 channel EEG carried out with the patient in the intubated state.  Description:  The background activity is marred by muscle and movement artifact taht obscures the background often consists of a low voltage, symmetric making it difficult to evalutate.  When the background can be evaluated it consists of a poorly organized mixture of frequencies.  Alpha and thereta rhythms are most frequent although delta rhythms can be noted at times as well. This activity is of low voltage, diffusely distributed and continuous.  No epileptiform activity is noted.  Hyperventilation and intermittent photic stimulation were not performed  IMPRESSION: This is an abnormal electroencephalogram due to a slow, poorly organized background rhythm. This finding may be seen with a diffuse disturbance that is etiologically nonspecific, but may include a metabolic encephalopathy, among other possibilities.  No epileptiform activity was noted.     Alexis Goodell, MD Neurology [phone] 10/24/2017, 5:52 PM

## 2017-10-24 NOTE — Progress Notes (Signed)
[facility] [place] NAME: Michelle [name]    MR#:  [id number]  DATE OF BIRTH:  1961/05/25  SUBJECTIVE:  Patient seen and evaluated today On tracheostomy with ventilator support Failed pressure support ventilation Currently on 24% FiO2 Had seizure-like activity yesterday Patient started on [place] tube placed 10/17/17.   REVIEW OF SYSTEMS:  Review of Systems  Unable to perform ROS: Critical illness    DRUG ALLERGIES:   Allergies  Allergen Reactions  . Carvedilol Other (See Comments)    Pt gets "sick" when on medication    VITALS:  Blood pressure (!) 153/91, pulse (!) 101, temperature 99.7 F (37.6 C), resp. rate (!) 22, height 4\' 9"  (1.448 m), weight 58.1 kg (128 lb 1.4 oz), last menstrual period 03/06/2005, SpO2 97 %.  PHYSICAL EXAMINATION:  Physical Exam  GENERAL:  57 y.o.-year-old patient lying in the bed with ventilator support EYES: Pupils equal, round, reactive to light and accommodation. No scleral icterus.  HEENT: Head atraumatic, normocephalic. Oropharynx and nasopharynx clear. has OG tube  NECK:  Supple, no jugular venous distention. No thyroid enlargement, no tendHrness.  Has tracheostomy LUNGS: Decreased breath sounds bilaterally, no wheezing, rales,rhonchi or crepitation. No use of accessory muscles of respiration. Decreased bibasilar breath sounds Vent setting  Tidal volume 450 Rate 15 Peep 5 Fi02 24% CARDIOVASCULAR: S1, S2 normal. No murmurs, rubs, or gallops.  ABDOMEN: Soft, nontender, nondistended.  Hypoactive bowel sounds present. No organomegaly or mass.  EXTREMITIES: No cyanosis, or clubbing. 1-2+ edema of hands and feet edema NEUROLOGIC: Awake, responds to loud verbal commands Not completely oriented to time, place and person PSYCHIATRIC: Unable to assess completely SKIN: No obvious rash, lesion, or ulcer.    LABORATORY PANEL:   CBC Recent Labs  Lab 10/24/17 0338  WBC 6.8  HGB 8.0*    HCT 24.2*  PLT 149*   ------------------------------------------------------------------------------------------------------------------  Chemistries  Recent Labs  Lab 10/24/17 0338  NA 135  K 3.9  CL 98*  CO2 32  GLUCOSE 173*  BUN 24*  CREATININE 0.31*  CALCIUM 8.6*   ------------------------------------------------------------------------------------------------------------------  Cardiac Enzymes No results for input(s): TROPONINI in the last 168 hours. ------------------------------------------------------------------------------------------------------------------  RADIOLOGY:  Dg Chest Port 1 View  Result Date: 10/23/2017 CLINICAL DATA:  Acute respiratory failure EXAM: PORTABLE CHEST 1 VIEW COMPARISON:  10/22/2017 FINDINGS: Tracheostomy, right PICC line and NG tube remain in place, unchanged. There is hyperinflation of the lungs compatible with COPD. Heart is normal size. No confluent opacities or effusions. No acute bony abnormality. IMPRESSION: COPD.  No active disease. Electronically Signed   By: Rolm Baptise M.D.   On: 10/23/2017 19:45    EKG:   Orders placed or performed during the hospital encounter of 09/30/17  . EKG 12-Lead  . EKG 12-Lead  . EKG 12-Lead  . EKG 12-Lead  . EKG 12-Lead  . EKG 12-Lead  . EKG 12-Lead  . EKG 12-Lead    ASSESSMENT AND PLAN:   [name] a57 y.o.femalewith a known history of anxiety, CHF, cocaine abuse, COPD, coronary artery disease, hepatitis C, hypertension admitted for acute respiratory failure and intubated.  1.  Acute on chronic respiratory failure with persistent hypoxia and hypercapnia-secondary to acute on chronic COPD exacerbation -On tracheostomy with ventilator support -Continue Vent bundle Failed pressure support  Unable to wean ventilator -Appreciate intensivist management  -On steroids, nebs.   2.  Hypotension improved Off pressor meds  3.  Anxiety-continue Xanax scheduled with decreased  dose  4.   DVT prophylaxis-subcutaneous heparin  6.  Off fentanyl drip Off all antibiotics  7.  Nutrition via NG tube feedings  8.seizure Continue Keppra  follow-up EEG and MRI brain  neurology follow-up  9.  Monitor blood sugars     All the records are reviewed and case discussed with Care Management/Social Workerr. Management plans discussed with the patient, family and they are in agreement.  CODE STATUS: Full Code  TOTAL TIME TAKING CARE OF THIS PATIENT: 33 minutes.   POSSIBLE D/C IN ? DAYS, DEPENDING ON CLINICAL CONDITION.   Saundra Shelling M.D on 10/24/2017 at 1:22 PM  Between 7am to 6pm - Pager - [phone]  After 6pm go to [email or web address] - password [email or web address] [facility]  Office  [phone]  CC: Primary care physician; Center, [facility]

## 2017-10-25 ENCOUNTER — Other Ambulatory Visit: Payer: Medicaid Other

## 2017-10-25 DIAGNOSIS — Z9911 Dependence on respirator [ventilator] status: Secondary | ICD-10-CM

## 2017-10-25 LAB — GLUCOSE, CAPILLARY
GLUCOSE-CAPILLARY: 158 mg/dL — AB (ref 65–99)
GLUCOSE-CAPILLARY: 174 mg/dL — AB (ref 65–99)
Glucose-Capillary: 193 mg/dL — ABNORMAL HIGH (ref 65–99)
Glucose-Capillary: 166 mg/dL — ABNORMAL HIGH (ref 65–99)
Glucose-Capillary: 114 mg/dL — ABNORMAL HIGH (ref 65–99)
Glucose-Capillary: 149 mg/dL — ABNORMAL HIGH (ref 65–99)

## 2017-10-25 MED ORDER — GLYCOPYRROLATE 0.2 MG/ML IJ SOLN
0.2000 mg | Freq: Three times a day (TID) | INTRAMUSCULAR | Status: DC | PRN
  Administered 2017-10-25 – 2017-11-03 (×3): 0.2 mg via INTRAVENOUS
  Filled 2017-10-25 (×3): qty 1

## 2017-10-25 NOTE — Progress Notes (Signed)
Physical Therapy Treatment Patient Details Name: Michelle [name] MRN: [id number] DOB: 26-Dec-1960 Today's Date: 10/25/2017    History of Present Illness Pt is a 57 y/o F who presented with severe SOB with COPD exacerbation.  Chest x-ray was clear, patient was slightly hypotensive after intubation, admitted to the ICU.  Trach tube placed 5/16.  Pt demonstrated seizure like activity on 5/23, MRI negative, EEG abnormal but no epileptiform activity.  Pt's PMH includes cocaine abuse, COPD, CAD, hepatitis C, CHF.     PT Comments    Ms. Ragone was very pleasant and agreeable to therapy.  PT session very limited due to pt's sudden increase in WOB with very light activity.  This PT assisted pt with R and L wrist P/AA/AROM exercises and gentle distal to proximal massage.  When assisting pt with L wrist F and E the pt with sudden increase in WOB.  Pt on pressure support.  RN notified who contacted Respiratory Therapist.  Follow up recommendations remain appropriate.    Follow Up Recommendations  SNF     Equipment Recommendations  Other (comment)(TBD as pt progresses)    Recommendations for Other Services       Precautions / Restrictions Precautions Precautions: Fall;Other (comment) Precaution Comments: trach, NG tube, PICC line Restrictions Weight Bearing Restrictions: No    Mobility  Bed Mobility               General bed mobility comments: Unable to attempt this session due to increased WOB  Transfers                    Ambulation/Gait                 Stairs             Wheelchair Mobility    Modified Rankin (Stroke Patients Only)       Balance                                            Cognition Arousal/Alertness: Awake/alert Behavior During Therapy: WFL for tasks assessed/performed Overall Cognitive Status: Difficult to assess                                 General Comments: Pt able to answer yes/no questions  appropriately and mouth answers      Exercises Other Exercises Other Exercises: Bil wrist F and E PROM x10 each direction Other Exercises: Bil composite digit F and E x15 each UE (pt able to perform ~4 reps, suspect limited by fatigue).   Other Exercises: Distal to proximal gentle massage to Bil fingers, hands, forearm for decreased swelling    General Comments General comments (skin integrity, edema, etc.): This PT assisted pt with R and L wrist P/AA/AROM exercises and gentle distal to proximal massage.  When assisting pt with L wrist F and E the pt with sudden increase in WOB.  Pt on pressure support.  RN notified who contacted Respiratory Therapist.       Pertinent Vitals/Pain Pain Assessment: Faces Faces Pain Scale: Hurts little more Pain Location: pt mouths "stomach"(With increased WOB agrees with RN asking about breathing pai) Pain Descriptors / Indicators: Grimacing Pain Intervention(s): Limited activity within patient's tolerance;Monitored during session    Home Living  Prior Function            PT Goals (current goals can now be found in the care plan section) Acute Rehab PT Goals PT Goal Formulation: Patient unable to participate in goal setting Time For Goal Achievement: 11/01/17 Potential to Achieve Goals: Fair Progress towards PT goals: Not progressing toward goals - comment(due to respiratory issues)    Frequency    Min 2X/week      PT Plan Current plan remains appropriate    Co-evaluation              AM-PAC PT "6 Clicks" Daily Activity  Outcome Measure  Difficulty turning over in bed (including adjusting bedclothes, sheets and blankets)?: Unable Difficulty moving from lying on back to sitting on the side of the bed? : Unable Difficulty sitting down on and standing up from a chair with arms (e.g., wheelchair, bedside commode, etc,.)?: Unable Help needed moving to and from a bed to chair (including a wheelchair)?:  Total Help needed walking in hospital room?: Total Help needed climbing 3-5 steps with a railing? : Total 6 Click Score: 6    End of Session   Activity Tolerance: Treatment limited secondary to medical complications (Comment)(increased WOB) Patient left: in bed;with call bell/phone within reach Nurse Communication: Other (comment)(pt's increased WOB with very light activity) PT Visit Diagnosis: Muscle weakness (generalized) (M62.81);Difficulty in walking, not elsewhere classified (R26.2)     Time: 8016-5537 PT Time Calculation (min) (ACUTE ONLY): 14 min  Charges:  $Therapeutic Exercise: 8-22 mins                    G Codes:       [name] PT, DPT 10/25/2017, 1:48 PM

## 2017-10-25 NOTE — Progress Notes (Signed)
Patient ID: Michelle [name], female   DOB: [birth year], 57 y.o.   MRN: [id number]  [facility] PROGRESS NOTE  SHAKARA TWEEDY PRF:[id number] DOB: [birth year] DOA: 09/30/2017 PCP: [facility]  HPI/Subjective: Patient was trying to talk with me and then started having some respiratory distress.  I contacted the nurse to suction but not much came out.  Respiratory was notified and critical care specialist notified.  Objective: Vitals:   10/25/17 0400 10/25/17 0743  BP: 139/77   Pulse: 87   Resp: (!) 24   Temp: 99.5 F (37.5 C)   SpO2: 100% 99%    Filed Weights   10/23/17 0403 10/24/17 0400 10/25/17 0500  Weight: 57.6 kg (126 lb 15.8 oz) 58.1 kg (128 lb 1.4 oz) 60.1 kg (132 lb 7.9 oz)    ROS: Review of Systems  Unable to perform ROS: Acuity of condition  Respiratory: Positive for shortness of breath.   Cardiovascular: Negative for chest pain.  Gastrointestinal: Negative for abdominal pain.  Genitourinary: Negative for dysuria.   Exam: Physical Exam  HENT:  Nose: No mucosal edema.  Mouth/Throat: No oropharyngeal exudate.  Eyes: Pupils are equal, round, and reactive to light. Conjunctivae and lids are normal.  Neck: Carotid bruit is not present.  Cardiovascular: S1 normal and normal heart sounds. Tachycardia present.  Respiratory: Accessory muscle usage present. She has decreased breath sounds in the right middle field, the right lower field, the left middle field and the left lower field. She has no wheezes. She has rhonchi in the right middle field, the right lower field, the left middle field and the left lower field.  GI: Soft. Bowel sounds are normal. There is no tenderness.  Musculoskeletal:       Right wrist: She exhibits swelling.       Left wrist: She exhibits swelling.       Right ankle: She exhibits swelling.       Left ankle: She exhibits swelling.  Lymphadenopathy:    She has no cervical adenopathy.  Neurological: She is alert.  Able to squeeze  my hands and wiggle toes on the left foot.  Skin: Skin is warm. No rash noted. Nails show no clubbing.  As per nursing staff stage II on buttock  Psychiatric:  Alert and answers a few questions.      Data Reviewed: Basic Metabolic Panel: Recent Labs  Lab 10/19/17 0544 10/21/17 0417 10/22/17 1540 10/23/17 0353 10/24/17 0338  NA 135 135 135 137 135  K 3.9 4.0 4.3 3.8 3.9  CL 100* 98* 97* 98* 98*  CO2 30 31 32 31 32  GLUCOSE 130* 122* 197* 120* 173*  BUN 31* 21* 26* 23* 24*  CREATININE 0.38* <0.30* 0.36* <0.30* 0.31*  CALCIUM 8.1* 8.4* 8.5* 8.9 8.6*    CBC: Recent Labs  Lab 10/19/17 0544 10/22/17 0543 10/23/17 0353 10/24/17 0338  WBC 7.0 7.8 8.7 6.8  NEUTROABS  --  6.7* 7.8* 6.4  HGB 9.2* 8.8* 8.9* 8.0*  HCT 28.8* 26.9* 26.9* 24.2*  MCV 90.9 90.0 89.8 89.6  PLT 162 170 181 149*   BNP (last 3 results) Recent Labs    06/29/17 0412 08/20/17 1337 09/30/17 1705  BNP 38.0 33.0 66.0     CBG: Recent Labs  Lab 10/24/17 1625 10/24/17 2001 10/24/17 2331 10/25/17 0335 10/25/17 0737  GLUCAP 120* 146* 138* 193* 158*    Recent Results (from the past 240 hour(s))  CULTURE, BLOOD (ROUTINE X 2) w Reflex to ID Panel  Status: Abnormal   Collection Time: 10/18/17 10:51 AM  Result Value Ref Range Status   Specimen Description   Final    BLOOD LEFT ANTECUBITAL Performed at [facility], [address], [place], [place] [address]    Special Requests   Final    BOTTLES DRAWN AEROBIC AND ANAEROBIC Blood Culture adequate volume Performed at [facility], [address] [place], [place] [address]    Culture  Setup Time   Final    GRAM POSITIVE COCCI ANAEROBIC BOTTLE ONLY CRITICAL RESULT CALLED TO, READ BACK BY AND VERIFIED WITH: MELISSA MACCIA 10/19/17 @ 7619  [place]    Culture (A)  Final    STAPHYLOCOCCUS SPECIES (COAGULASE NEGATIVE) THE SIGNIFICANCE OF ISOLATING THIS ORGANISM FROM A SINGLE SET OF BLOOD CULTURES WHEN MULTIPLE SETS ARE DRAWN IS  UNCERTAIN. PLEASE NOTIFY THE MICROBIOLOGY DEPARTMENT WITHIN ONE WEEK IF SPECIATION AND SENSITIVITIES ARE REQUIRED. Performed at [facility], [place] [address], [place], [place] [address]    Report Status 10/21/2017 FINAL  Final  Blood Culture ID Panel (Reflexed)     Status: Abnormal   Collection Time: 10/18/17 10:51 AM  Result Value Ref Range Status   Enterococcus species NOT DETECTED NOT DETECTED Final   Listeria monocytogenes NOT DETECTED NOT DETECTED Final   Staphylococcus species DETECTED (A) NOT DETECTED Final    Comment: Methicillin (oxacillin) resistant coagulase negative staphylococcus. Possible blood culture contaminant (unless isolated from more than one blood culture draw or clinical case suggests pathogenicity). No antibiotic treatment is indicated for blood  culture contaminants. CRITICAL RESULT CALLED TO, READ BACK BY AND VERIFIED WITH: MELISSA [place] 10/19/17 @ 6712  [place]    Staphylococcus aureus NOT DETECTED NOT DETECTED Final   Methicillin resistance DETECTED (A) NOT DETECTED Final    Comment: CRITICAL RESULT CALLED TO, READ BACK BY AND VERIFIED WITH: MELISSA MACCIA 10/19/17 @ 4580  [place]    Streptococcus species NOT DETECTED NOT DETECTED Final   Streptococcus agalactiae NOT DETECTED NOT DETECTED Final   Streptococcus pneumoniae NOT DETECTED NOT DETECTED Final   Streptococcus pyogenes NOT DETECTED NOT DETECTED Final   Acinetobacter baumannii NOT DETECTED NOT DETECTED Final   Enterobacteriaceae species NOT DETECTED NOT DETECTED Final   Enterobacter cloacae complex NOT DETECTED NOT DETECTED Final   Escherichia coli NOT DETECTED NOT DETECTED Final   Klebsiella oxytoca NOT DETECTED NOT DETECTED Final   Klebsiella pneumoniae NOT DETECTED NOT DETECTED Final   Proteus species NOT DETECTED NOT DETECTED Final   Serratia marcescens NOT DETECTED NOT DETECTED Final   Haemophilus influenzae NOT DETECTED NOT DETECTED Final   Neisseria meningitidis NOT DETECTED NOT DETECTED Final    Pseudomonas aeruginosa NOT DETECTED NOT DETECTED Final   Candida albicans NOT DETECTED NOT DETECTED Final   Candida glabrata NOT DETECTED NOT DETECTED Final   Candida krusei NOT DETECTED NOT DETECTED Final   Candida parapsilosis NOT DETECTED NOT DETECTED Final   Candida tropicalis NOT DETECTED NOT DETECTED Final    Comment: Performed at [facility], [address] [place], [place] [address]  Culture, respiratory (NON-Expectorated)     Status: None   Collection Time: 10/18/17 11:09 AM  Result Value Ref Range Status   Specimen Description   Final    TRACHEAL ASPIRATE Performed at [facility], [address] [place], [place] [address]    Special Requests   Final    NONE Performed at [facility], [address] [place], [place] [address]    Gram  Stain   Final    MODERATE WBC PRESENT, PREDOMINANTLY PMN NO ORGANISMS SEEN Performed at [facility], [place] [address], [place], [place] [address]    Culture   Final    RARE CANDIDA DUBLINIENSIS RARE FUNGUS (MOLD) ISOLATED, PROBABLE CONTAMINANT/COLONIZER (SAPROPHYTE). CONTACT MICROBIOLOGY IF FURTHER IDENTIFICATION REQUIRED [phone].    Report Status 10/21/2017 FINAL  Final  CULTURE, BLOOD (ROUTINE X 2) w Reflex to ID Panel     Status: None   Collection Time: 10/18/17  4:09 PM  Result Value Ref Range Status   Specimen Description BLOOD L FA  Final   Special Requests   Final    BOTTLES DRAWN AEROBIC AND ANAEROBIC Blood Culture adequate volume   Culture   Final    NO GROWTH 5 DAYS Performed at [facility], [address] [place], [place] [address]    Report Status 10/23/2017 FINAL  Final  CULTURE, BLOOD (ROUTINE X 2) w Reflex to ID Panel     Status: None   Collection Time: 10/19/17  8:36 AM  Result Value Ref Range Status   Specimen Description BLOOD BLOOD LEFT HAND  Final   Special Requests   Final    BOTTLES DRAWN AEROBIC AND ANAEROBIC Blood Culture results may not be optimal due to  an inadequate volume of blood received in culture bottles   Culture   Final    NO GROWTH 5 DAYS Performed at [facility], [address] [place], [place] [address]    Report Status 10/24/2017 FINAL  Final  CULTURE, BLOOD (ROUTINE X 2) w Reflex to ID Panel     Status: None   Collection Time: 10/19/17  8:52 AM  Result Value Ref Range Status   Specimen Description BLOOD A-LINE DRAW  Final   Special Requests   Final    BOTTLES DRAWN AEROBIC AND ANAEROBIC Blood Culture adequate volume   Culture   Final    NO GROWTH 5 DAYS Performed at [facility], [address], [place], [place] [address]    Report Status 10/24/2017 FINAL  Final  Culture, respiratory (NON-Expectorated)     Status: None (Preliminary result)   Collection Time: 10/23/17 11:36 PM  Result Value Ref Range Status   Specimen Description   Final    TRACHEAL ASPIRATE Performed at [facility], [address] [place], [place] [address]    Special Requests   Final    NONE Performed at [facility], [address] [place], [place] [address]    Gram Stain   Final    RARE WBC PRESENT, PREDOMINANTLY PMN NO ORGANISMS SEEN Performed at [facility], [place] [address] [place], [place] [address]    Culture PENDING  Incomplete   Report Status PENDING  Incomplete     Studies: Mr Brain Wo Contrast  Result Date: 10/24/2017 CLINICAL DATA:  Initial evaluation for acute altered mental status. EXAM: MRI HEAD WITHOUT CONTRAST TECHNIQUE: Multiplanar, multiecho pulse sequences of the brain and surrounding structures were obtained without intravenous contrast. COMPARISON:  None available. FINDINGS: Brain: Diffuse prominence of the CSF containing spaces compatible generalized cerebral atrophy. Patchy and confluent T2/FLAIR hyperintensity within the periventricular and deep white matter both cerebral hemispheres, most consistent with chronic small vessel ischemic change. Chronic micro vessel  ischemic changes present within the pons as well. Superimposed remote lacunar infarcts present within the bilateral basal ganglia/corona radiata, with additional remote lacunar infarcts within the pons. Small remote left cerebellar infarct noted. No  abnormal foci of restricted diffusion to suggest acute or subacute ischemia. Gray-white matter differentiation maintained. No other areas of remote cortical infarction. No acute intracranial hemorrhage. Small chronic micro hemorrhages noted within the right thalamus and left cerebellar hemisphere, likely small vessel related. No mass lesion, midline shift or mass effect. No hydrocephalus. No extra-axial fluid collection. Major dural sinuses are grossly patent. Pituitary gland and suprasellar region normal. Vascular: Major intracranial vascular flow voids are maintained. Skull and upper cervical spine: Craniocervical junction normal. Upper cervical spine within normal limits. Bone marrow signal intensity normal. No scalp soft tissue abnormality. Sinuses/Orbits: Globes normal soft tissues within normal limits. Scattered mucosal thickening throughout the paranasal sinuses. Air-fluid levels within the sphenoid and right maxillary sinuses. Layering fluid seen within the nasopharynx. Patient appears to be intubated. Bilateral mastoid effusions noted. Other: None. IMPRESSION: 1. No acute intracranial abnormality. 2. Age-related cerebral atrophy with moderate chronic small vessel ischemic change involving the periventricular white matter and pons. Electronically Signed   By: Jeannine Boga M.D.   On: 10/24/2017 14:33   Dg Chest Port 1 View  Result Date: 10/23/2017 CLINICAL DATA:  Acute respiratory failure EXAM: PORTABLE CHEST 1 VIEW COMPARISON:  10/22/2017 FINDINGS: Tracheostomy, right PICC line and NG tube remain in place, unchanged. There is hyperinflation of the lungs compatible with COPD. Heart is normal size. No confluent opacities or effusions. No acute bony  abnormality. IMPRESSION: COPD.  No active disease. Electronically Signed   By: Rolm Baptise M.D.   On: 10/23/2017 19:45    Scheduled Meds: . ALPRAZolam  0.5 mg Per Tube TID  . aspirin  81 mg Oral Daily  . budesonide (PULMICORT) nebulizer solution  0.5 mg Nebulization BID  . chlorhexidine gluconate (MEDLINE KIT)  15 mL Mouth Rinse BID  . cloNIDine  0.3 mg Transdermal Weekly  . escitalopram  10 mg Per Tube Daily  . famotidine  20 mg Per Tube Daily  . fentaNYL  75 mcg Transdermal Q72H  . free water  100 mL Per Tube Q8H  . heparin  5,000 Units Subcutaneous Q8H  . insulin aspart  0-9 Units Subcutaneous Q4H  . ipratropium-albuterol  3 mL Nebulization Q6H  . mouth rinse  15 mL Mouth Rinse 10 times per day  . methylPREDNISolone (SOLU-MEDROL) injection  40 mg Intravenous Q12H  . multivitamin  15 mL Per Tube Daily  . QUEtiapine  50 mg Per Tube QHS  . risperiDONE  2 mg Per Tube QHS  . scopolamine  1 patch Transdermal Q72H  . senna-docusate  1 tablet Per Tube BID  . sodium chloride flush  10-40 mL Intracatheter Q12H   Continuous Infusions: . feeding supplement (VITAL AF 1.2 CAL) 45 mL/hr at 10/25/17 0500  . fentaNYL infusion INTRAVENOUS 75 mcg/hr (10/20/17 0211)  . small volume/piggyback builder      Assessment/Plan:  1. Acute on chronic respiratory failure.  Patient now with tracheostomy on ventilator support.  When I saw the patient she went into respiratory distress and she was trying to talk.  Case discussed with critical care specialist.  Respiratory came in and vent changes made.  Patient breathing a little more comfortably after changes made. 2. COPD exacerbation still on steroids 3. Hypertension on clonidine patch 4. Anxiety on numerous psychiatric medications.  MRI of the brain negative for stroke 5. Stage II decubitus ulcer on buttock.  Local wound care 6. Anemia.  Hemoglobin of 8.0.  Continue to monitor closely for need for transfusion. 7. Nutrition NG tube feeding.  May end up  needing PEG.  Code Status:     Code Status Orders  (From admission, onward)        Start     Ordered   09/30/17 1946  Full code  Continuous     09/30/17 1945    Code Status History    Date Active Date Inactive Code Status Order ID Comments User Context   08/20/2017 1713 08/24/2017 1743 Full Code [id number]  Dustin Flock, MD Inpatient   06/29/2017 0625 07/02/2017 2008 Full Code [id number]  Harrie Foreman, MD Inpatient   02/23/2017 0241 02/26/2017 1634 Full Code [id number]  Harrie Foreman, MD Inpatient   01/01/2017 1144 01/04/2017 1354 Full Code [id number]  Bettey Costa, MD Inpatient   05/22/2016 2155 05/27/2016 1645 Full Code [id number]  Fritzi Mandes, MD Inpatient   04/23/2016 2020 04/27/2016 1848 Full Code [id number]  Hower, Aaron Mose, MD ED   02/07/2016 1922 02/10/2016 1835 Full Code [id number]  Gladstone Lighter, MD Inpatient   11/29/2015 0519 12/01/2015 1609 Full Code [id number]  Harrie Foreman, MD Inpatient   11/05/2015 0911 11/07/2015 1606 Full Code [id number]  Hillary Bow, MD ED   10/24/2014 1522 10/27/2014 1630 Full Code [id number]  Hillary Bow, MD Inpatient     Family Communication: As per critical care specialist Disposition Plan: To be determined  Time spent: 28 minutes, case discussed with critical care specialist and [place]

## 2017-10-25 NOTE — Plan of Care (Addendum)
Patient resting in bed on ventilator. Attempted to call husband Pieter Partridge for [place] conversation. Message at this number stating "voicemail has not been set up". Called daughters Lisbeth Puller and [place] with no answer. Will reattempt Monday.   Addendum: Daughter [name] returned call and was with her father Pieter Partridge. Meeting scheduled Monday at 3:00pm.     No charge.

## 2017-10-25 NOTE — Clinical Social Work Note (Signed)
CSW has reviewed Dietician's documentation and paitent does not yet have a PEG. Patient will require a PEG prior to being transferred to a vent/snf. Shela Leff MSW,LcSW [phone]

## 2017-10-25 NOTE — Clinical Social Work Note (Signed)
CSW contacted by Palliative Care yesterday for an update regarding what CSW is aware of as of to date. Palliative Care stated that they had just been consulted to speak with family. CSW has requested Palliative Care advise CSW if there are any changes that will need to be made to the discharge plan. Further updated information sent to Sonia Baller at [facility] and she will be coordinating with patient's husband to complete medicaid paperwork for [place] and mother will have to turn in requested documents to [facility] prior to patient transferring to [facility]. Shela Leff MSW,LCSW [phone]

## 2017-10-25 NOTE — Progress Notes (Signed)
CHIEF COMPLAINT:   Chief Complaint  Patient presents with  . Shortness of Breath  acute hypoxic/hypercapnic resp failure   Subjective  Remains on vent,sedated Has episodes of intermittent dysnoea     Objective    On vent  Examination:  General exam: Appears calm and comfortable but episodal dysnoea when awake and interactive  Respiratory system: Clear to auscultation. Respiratory effort normal. HEENT: Circle/AT, PERRLA, no thrush, no stridor. Cardiovascular system: S1 & S2 heard, RRR. No JVD, murmurs, rubs, gallops or clicks. No pedal edema. Gastrointestinal system: Abdomen is nondistended, soft and nontender. No organomegaly or masses felt. Normal bowel sounds heard. Central nervous system: Alert and oriented. No focal neurological deficits. Extremities: Symmetric 5 x 5 power. Skin: No rashes, lesions or ulcers Psychiatry: Judgement and insight appear normal. Mood & affect appropriate.  Neuro: appropriately  VITALS:  height is 4\' 9"  (1.448 m) and weight is 132 lb 7.9 oz (60.1 kg). Her axillary temperature is 98.1 F (36.7 C). Her blood pressure is 111/76 and her pulse is 98. Her respiration is 18 and oxygen saturation is 96%.   I personally reviewed Labs under Results section.  Radiology Reports Mr Brain Wo Contrast  Result Date: 10/24/2017 CLINICAL DATA:  Initial evaluation for acute altered mental status. EXAM: MRI HEAD WITHOUT CONTRAST TECHNIQUE: Multiplanar, multiecho pulse sequences of the brain and surrounding structures were obtained without intravenous contrast. COMPARISON:  None available. FINDINGS: Brain: Diffuse prominence of the CSF containing spaces compatible generalized cerebral atrophy. Patchy and confluent T2/FLAIR hyperintensity within the periventricular and deep white matter both cerebral hemispheres, most consistent with chronic small vessel ischemic change. Chronic micro vessel ischemic changes present within the pons as well. Superimposed remote lacunar  infarcts present within the bilateral basal ganglia/corona radiata, with additional remote lacunar infarcts within the pons. Small remote left cerebellar infarct noted. No abnormal foci of restricted diffusion to suggest acute or subacute ischemia. Gray-white matter differentiation maintained. No other areas of remote cortical infarction. No acute intracranial hemorrhage. Small chronic micro hemorrhages noted within the right thalamus and left cerebellar hemisphere, likely small vessel related. No mass lesion, midline shift or mass effect. No hydrocephalus. No extra-axial fluid collection. Major dural sinuses are grossly patent. Pituitary gland and suprasellar region normal. Vascular: Major intracranial vascular flow voids are maintained. Skull and upper cervical spine: Craniocervical junction normal. Upper cervical spine within normal limits. Bone marrow signal intensity normal. No scalp soft tissue abnormality. Sinuses/Orbits: Globes normal soft tissues within normal limits. Scattered mucosal thickening throughout the paranasal sinuses. Air-fluid levels within the sphenoid and right maxillary sinuses. Layering fluid seen within the nasopharynx. Patient appears to be intubated. Bilateral mastoid effusions noted. Other: None. IMPRESSION: 1. No acute intracranial abnormality. 2. Age-related cerebral atrophy with moderate chronic small vessel ischemic change involving the periventricular white matter and pons. Electronically Signed   By: Jeannine Boga M.D.   On: 10/24/2017 14:33   Dg Chest Port 1 View  Result Date: 10/23/2017 CLINICAL DATA:  Acute respiratory failure EXAM: PORTABLE CHEST 1 VIEW COMPARISON:  10/22/2017 FINDINGS: Tracheostomy, right PICC line and NG tube remain in place, unchanged. There is hyperinflation of the lungs compatible with COPD. Heart is normal size. No confluent opacities or effusions. No acute bony abnormality. IMPRESSION: COPD.  No active disease. Electronically Signed   By:  Rolm Baptise M.D.   On: 10/23/2017 19:45       Assessment/Plan:  Acute/chronic respiratory failure Very severe COPD with acute exacerbation Prolonged mechanical ventilation Trach tube  status - placed 05/16 Intermittent, severe agitation Complicated by acute neurological change Failure to wean from vent-placed back on PRVC mode Sedatives as needed Plan: Will get ECHO to R/O cardiac etiology.     Severe Hypoxic and Hypercapnic Respiratory Failure -continue Full MV support -continue Bronchodilator Therapy -Wean Fio2 and PEEP as tolerated   NEUROLOGY Nods appropriately EEG showed no seizure activity, MRI-  . No acute intracranial abnormality. 2. Age-related cerebral atrophy with moderate chronic small vessel ischemic change involving the periventricular white matter and pons. Continue on Keppra for now  DVT/GI PRX ordered TRANSFUSIONS AS NEEDED MONITOR FSBS ASSESS the need for LABS as needed  Family: daughter updated   Critical Care Time devoted to patient care services described in this note is 40 minutes.   Overall, patient is critically ill, prognosis is guarded.  Patient with Multiorgan failure and at high risk for cardiac arrest and death.    Cammie Sickle, M.D.  [facility]

## 2017-10-26 ENCOUNTER — Inpatient Hospital Stay (HOSPITAL_COMMUNITY)
Admit: 2017-10-26 | Discharge: 2017-10-26 | Disposition: A | Discharge status: Home / Self Care | Payer: Medicaid Other | Attending: Internal Medicine | Admitting: Internal Medicine

## 2017-10-26 DIAGNOSIS — J9601 Acute respiratory failure with hypoxia: Secondary | ICD-10-CM

## 2017-10-26 LAB — CULTURE, RESPIRATORY: CULTURE: NORMAL

## 2017-10-26 LAB — CBC
HEMATOCRIT: 24 % — AB (ref 35.0–47.0)
Hemoglobin: 7.8 g/dL — ABNORMAL LOW (ref 12.0–16.0)
MCH: 29.5 pg (ref 26.0–34.0)
MCHC: 32.8 g/dL (ref 32.0–36.0)
MCV: 90.2 fL (ref 80.0–100.0)
PLATELETS: 170 10*3/uL (ref 150–440)
RBC: 2.66 MIL/uL — ABNORMAL LOW (ref 3.80–5.20)
RDW: 16.9 % — AB (ref 11.5–14.5)
WBC: 7 10*3/uL (ref 3.6–11.0)

## 2017-10-26 LAB — GLUCOSE, CAPILLARY
GLUCOSE-CAPILLARY: 171 mg/dL — AB (ref 65–99)
GLUCOSE-CAPILLARY: 154 mg/dL — AB (ref 65–99)
GLUCOSE-CAPILLARY: 138 mg/dL — AB (ref 65–99)
GLUCOSE-CAPILLARY: 134 mg/dL — AB (ref 65–99)
GLUCOSE-CAPILLARY: 166 mg/dL — AB (ref 65–99)
Glucose-Capillary: 171 mg/dL — ABNORMAL HIGH (ref 65–99)
Glucose-Capillary: 120 mg/dL — ABNORMAL HIGH (ref 65–99)

## 2017-10-26 LAB — CULTURE, RESPIRATORY W GRAM STAIN

## 2017-10-26 NOTE — Progress Notes (Signed)
Attemped to place patient in Pressure support mode 14/5 and 24%- patient tolerated for approximately 15 min.  Patient then became tachypneic in 35-40's BPM - and BP 282 systolic. Patient placed back on a rate. Patient given prn fentanyl which did not help with her labored breathing then I gave her ativan and metoprolol.  Patient now resting comfortably at this time. Vitals stable.

## 2017-10-26 NOTE — Progress Notes (Signed)
*  PRELIMINARY RESULTS* Echocardiogram 2D Echocardiogram has been performed. NO BUBBLE STUDY PERFORMED ON THIS ECHO.  Michelle [name] Michelle [name] 10/26/2017, 2:33 PM

## 2017-10-26 NOTE — Progress Notes (Signed)
Dr. Manfred Shirts with ENT called and notified that patient's trach- though still sutured is coming out a couple of inches during the night shift.  MD came to assess patient and said that he would not touch the trach- keep as is and keep the ties secure.  I asked if he would like me to remove the sutures and he said to keep as is for now.

## 2017-10-26 NOTE — Progress Notes (Signed)
   CHIEF COMPLAINT:   Chief Complaint  Patient presents with  . Shortness of Breath  acute hypoxic/hypercapnic resp failure   Subjective  Remains on vent,sedated Has episodes of intermittent dysnoea     Objective    On vent  Examination:  General exam: Appears calm and comfortable but episodal dysnoea when awake and interactive  Respiratory system: Clear to auscultation. Respiratory effort normal. HEENT: [place]/AT, PERRLA, no thrush, no stridor. Cardiovascular system: S1 & S2 heard, RRR. No JVD, murmurs, rubs, gallops or clicks. No pedal edema. Gastrointestinal system: Abdomen is nondistended, soft and nontender. No organomegaly or masses felt. Normal bowel sounds heard. Central nervous system: Alert and oriented. No focal neurological deficits. Extremities: Symmetric 4/5power. Skin: No rashes, lesions or ulcers Psychiatry: anxious when awake Neuro: appropriately  VITALS:  height is 4\' 9"  (1.448 m) and weight is 128 lb 8.5 oz (58.3 kg). Her axillary temperature is 98.1 F (36.7 C). Her blood pressure is 136/77 and her pulse is 90. Her respiration is 20 and oxygen saturation is 100%.   I personally reviewed Labs under Results section.  Radiology Reports No results found.     Assessment/Plan:  Acute/chronic respiratory failure Very severe COPD with acute exacerbation Prolonged mechanical ventilation- tolerated 15 minutes of PS 14 Trach tube status - placed 05/16 Intermittent, severe agitation Complicated by acute neurological change Failure to wean from vent-placed back on PRVC mode Sedatives as needed Plan: ECHO with bubble study pending- R/O cardiac etiology.     Severe Hypoxic and Hypercapnic Respiratory Failure -continue Full MV support -continue Bronchodilator Therapy -Wean Fio2 and PEEP as tolerated   NEUROLOGY Nods appropriately EEG showed no seizure activity, MRI-  . No acute intracranial abnormality. 2. Age-related cerebral atrophy with  moderate chronic small vessel ischemic change involving the periventricular white matter and pons. Continue on Keppra for now  DVT/GI PRX ordered TRANSFUSIONS AS NEEDED MONITOR FSBS ASSESS the need for LABS as needed  Family: daughter updated   Critical Care Time devoted to patient care services described in this note is 40 minutes.   Overall, patient is critically ill, prognosis is guarded.  Patient with Multiorgan failure and at high risk for cardiac arrest and death.    Cammie Sickle, M.D.  [facility]

## 2017-10-26 NOTE — Progress Notes (Signed)
Patient ID: Michelle [name], female   DOB: [birth year], 57 y.o.   MRN: [id number]  [employer] PROGRESS NOTE  Michelle [name]:[id number] DOB: 1961/02/03 DOA: 09/30/2017 PCP: [facility]  HPI/Subjective: When I saw the patient today she was a little more lethargic than yesterday.  Objective: Vitals:   10/26/17 1400 10/26/17 1440  BP: 103/64   Pulse: 78   Resp: 16   Temp:    SpO2: 100% 100%    Filed Weights   10/24/17 0400 10/25/17 0500 10/26/17 0500  Weight: 58.1 kg (128 lb 1.4 oz) 60.1 kg (132 lb 7.9 oz) 58.3 kg (128 lb 8.5 oz)    ROS: ROS Exam: Physical Exam  HENT:  Nose: No mucosal edema.  Mouth/Throat: No oropharyngeal exudate.  Eyes: Pupils are equal, round, and reactive to light. Conjunctivae and lids are normal.  Neck: Carotid bruit is not present.  Cardiovascular: S1 normal and normal heart sounds.  Respiratory: No accessory muscle usage. She has decreased breath sounds in the right lower field and the left lower field. She has no wheezes. She has rhonchi in the right lower field and the left lower field.  GI: Soft. Bowel sounds are normal. There is no tenderness.  Musculoskeletal:       Right wrist: She exhibits swelling.       Left wrist: She exhibits swelling.       Right ankle: She exhibits swelling.       Left ankle: She exhibits swelling.  Lymphadenopathy:    She has no cervical adenopathy.  Neurological: She is alert.  Able to squeeze my hands and wiggle toes on the left foot.  Skin: Skin is warm. No rash noted. Nails show no clubbing.  As per nursing staff stage II on buttock  Psychiatric:  Alert and answers a few questions.      Data Reviewed: Basic Metabolic Panel: Recent Labs  Lab 10/21/17 0417 10/22/17 1540 10/23/17 0353 10/24/17 0338  NA 135 135 137 135  K 4.0 4.3 3.8 3.9  CL 98* 97* 98* 98*  CO2 31 32 31 32  GLUCOSE 122* 197* 120* 173*  BUN 21* 26* 23* 24*  CREATININE <0.30* 0.36* <0.30* 0.31*  CALCIUM 8.4*  8.5* 8.9 8.6*    CBC: Recent Labs  Lab 10/22/17 0543 10/23/17 0353 10/24/17 0338 10/26/17 0609  WBC 7.8 8.7 6.8 7.0  NEUTROABS 6.7* 7.8* 6.4  --   HGB 8.8* 8.9* 8.0* 7.8*  HCT 26.9* 26.9* 24.2* 24.0*  MCV 90.0 89.8 89.6 90.2  PLT 170 181 149* 170   BNP (last 3 results) Recent Labs    06/29/17 0412 08/20/17 1337 09/30/17 1705  BNP 38.0 33.0 66.0     CBG: Recent Labs  Lab 10/25/17 2329 10/26/17 0312 10/26/17 0325 10/26/17 0718 10/26/17 1138  GLUCAP 149* 171* 171* 154* 138*    Recent Results (from the past 240 hour(s))  CULTURE, BLOOD (ROUTINE X 2) w Reflex to ID Panel     Status: Abnormal   Collection Time: 10/18/17 10:51 AM  Result Value Ref Range Status   Specimen Description   Final    BLOOD LEFT ANTECUBITAL Performed at [facility], [address], [place], [place] [address]    Special Requests   Final    BOTTLES DRAWN AEROBIC AND ANAEROBIC Blood Culture adequate volume Performed at [facility], [address] [place], [place] [address]    Culture  Setup Time   Final    Michelle [name]  POSITIVE COCCI ANAEROBIC BOTTLE ONLY CRITICAL RESULT CALLED TO, READ BACK BY AND VERIFIED WITH: Michelle [name] 10/19/17 @ 1610  Michelle [name]    Culture (A)  Final    STAPHYLOCOCCUS SPECIES (COAGULASE NEGATIVE) THE SIGNIFICANCE OF ISOLATING THIS ORGANISM FROM A SINGLE SET OF BLOOD CULTURES WHEN MULTIPLE SETS ARE DRAWN IS UNCERTAIN. PLEASE NOTIFY THE MICROBIOLOGY DEPARTMENT WITHIN ONE WEEK IF SPECIATION AND SENSITIVITIES ARE REQUIRED. Performed at [facility], [place] [address], [place], [place] [address]    Report Status 10/21/2017 FINAL  Final  Blood Culture ID Panel (Reflexed)     Status: Abnormal   Collection Time: 10/18/17 10:51 AM  Result Value Ref Range Status   Enterococcus species NOT DETECTED NOT DETECTED Final   Listeria monocytogenes NOT DETECTED NOT DETECTED Final   Staphylococcus species DETECTED (A) NOT DETECTED Final    Comment: Methicillin  (oxacillin) resistant coagulase negative staphylococcus. Possible blood culture contaminant (unless isolated from more than one blood culture draw or clinical case suggests pathogenicity). No antibiotic treatment is indicated for blood  culture contaminants. CRITICAL RESULT CALLED TO, READ BACK BY AND VERIFIED WITH: Michelle [name] 10/19/17 @ 4098  Michelle [name]    Staphylococcus aureus NOT DETECTED NOT DETECTED Final   Methicillin resistance DETECTED (A) NOT DETECTED Final    Comment: CRITICAL RESULT CALLED TO, READ BACK BY AND VERIFIED WITH: Michelle [name] 10/19/17 @ 1191  Michelle [name]    Streptococcus species NOT DETECTED NOT DETECTED Final   Streptococcus agalactiae NOT DETECTED NOT DETECTED Final   Streptococcus pneumoniae NOT DETECTED NOT DETECTED Final   Streptococcus pyogenes NOT DETECTED NOT DETECTED Final   Acinetobacter baumannii NOT DETECTED NOT DETECTED Final   Enterobacteriaceae species NOT DETECTED NOT DETECTED Final   Enterobacter cloacae complex NOT DETECTED NOT DETECTED Final   Escherichia coli NOT DETECTED NOT DETECTED Final   Klebsiella oxytoca NOT DETECTED NOT DETECTED Final   Klebsiella pneumoniae NOT DETECTED NOT DETECTED Final   Proteus species NOT DETECTED NOT DETECTED Final   Serratia marcescens NOT DETECTED NOT DETECTED Final   Haemophilus influenzae NOT DETECTED NOT DETECTED Final   Neisseria meningitidis NOT DETECTED NOT DETECTED Final   Pseudomonas aeruginosa NOT DETECTED NOT DETECTED Final   Candida albicans NOT DETECTED NOT DETECTED Final   Candida glabrata NOT DETECTED NOT DETECTED Final   Candida krusei NOT DETECTED NOT DETECTED Final   Candida parapsilosis NOT DETECTED NOT DETECTED Final   Candida tropicalis NOT DETECTED NOT DETECTED Final    Comment: Performed at [facility], [address] [place], [place] [address]  Culture, respiratory (NON-Expectorated)     Status: None   Collection Time: 10/18/17 11:09 AM  Result Value Ref Range Status   Specimen  Description   Final    TRACHEAL ASPIRATE Performed at [facility], [address], [place], [place] [address]    Special Requests   Final    NONE Performed at [facility], [place], [place] [address]    Gram Stain   Final    MODERATE WBC PRESENT, PREDOMINANTLY PMN NO ORGANISMS SEEN Performed at [facility], [address] [place], [place] [address]    Culture   Final    RARE CANDIDA DUBLINIENSIS RARE FUNGUS (MOLD) ISOLATED, PROBABLE CONTAMINANT/COLONIZER (SAPROPHYTE). CONTACT MICROBIOLOGY IF FURTHER IDENTIFICATION REQUIRED [phone].    Report Status 10/21/2017 FINAL  Final  CULTURE, BLOOD (ROUTINE X 2) w Reflex to ID Panel     Status: None   Collection Time: 10/18/17  4:09 PM  Result Value Ref Range Status   Specimen Description BLOOD L FA  Final   Special Requests   Final    BOTTLES DRAWN AEROBIC AND ANAEROBIC Blood Culture adequate volume   Culture   Final    NO GROWTH 5 DAYS Performed at [facility], [address] [place], [place] [address]    Report Status 10/23/2017 FINAL  Final  CULTURE, BLOOD (ROUTINE X 2) w Reflex to ID Panel     Status: None   Collection Time: 10/19/17  8:36 AM  Result Value Ref Range Status   Specimen Description BLOOD BLOOD LEFT HAND  Final   Special Requests   Final    BOTTLES DRAWN AEROBIC AND ANAEROBIC Blood Culture results may not be optimal due to an inadequate volume of blood received in culture bottles   Culture   Final    NO GROWTH 5 DAYS Performed at [facility], [address] [place], [place] [address]    Report Status 10/24/2017 FINAL  Final  CULTURE, BLOOD (ROUTINE X 2) w Reflex to ID Panel     Status: None   Collection Time: 10/19/17  8:52 AM  Result Value Ref Range Status   Specimen Description BLOOD A-LINE DRAW  Final   Special Requests   Final    BOTTLES DRAWN AEROBIC AND ANAEROBIC Blood Culture adequate volume   Culture   Final    NO GROWTH 5  DAYS Performed at [facility], [address] [place], [place] [address]    Report Status 10/24/2017 FINAL  Final  Culture, respiratory (NON-Expectorated)     Status: None   Collection Time: 10/23/17 11:36 PM  Result Value Ref Range Status   Specimen Description   Final    TRACHEAL ASPIRATE Performed at [facility], [address], [place], [place] [address]    Special Requests   Final    NONE Performed at [facility], [address] [place], [place] [address]    Gram Stain   Final    RARE WBC PRESENT, PREDOMINANTLY PMN NO ORGANISMS SEEN Performed at [facility], [place] [address] [place], [place] [address]    Culture   Final    Consistent with normal respiratory flora. FUNGUS (MOLD) ISOLATED, PROBABLE CONTAMINANT/COLONIZER (SAPROPHYTE). CONTACT MICROBIOLOGY IF FURTHER IDENTIFICATION REQUIRED [phone].    Report Status 10/26/2017 FINAL  Final     Studies: No results found.  Scheduled Meds: . ALPRAZolam  0.5 mg Per Tube TID  . aspirin  81 mg Oral Daily  . budesonide (PULMICORT) nebulizer solution  0.5 mg Nebulization BID  . chlorhexidine gluconate (MEDLINE KIT)  15 mL Mouth Rinse BID  . cloNIDine  0.3 mg Transdermal Weekly  . escitalopram  10 mg Per Tube Daily  . famotidine  20 mg Per Tube Daily  . fentaNYL  75 mcg Transdermal Q72H  . free water  100 mL Per Tube Q8H  . heparin  5,000 Units Subcutaneous Q8H  . insulin aspart  0-9 Units Subcutaneous Q4H  . ipratropium-albuterol  3 mL Nebulization Q6H  . mouth rinse  15 mL Mouth Rinse 10 times per day  . methylPREDNISolone (SOLU-MEDROL) injection  40 mg Intravenous Q12H  . multivitamin  15 mL Per Tube Daily  . QUEtiapine  50 mg Per Tube QHS  . risperiDONE  2 mg Per Tube QHS  . scopolamine  1 patch Transdermal Q72H  . senna-docusate  1 tablet Per Tube BID  .  sodium chloride flush  10-40 mL Intracatheter Q12H   Continuous Infusions: . feeding supplement (VITAL AF 1.2 CAL)  1,000 mL (10/26/17 1501)  . fentaNYL infusion INTRAVENOUS Stopped (10/25/17 0700)  . small volume/piggyback builder      Assessment/Plan:  1. Acute on chronic respiratory failure.  Patient now with tracheostomy on ventilator support.  The patient had a pressure support trial today.   patient currently on 24% FiO2. 2. COPD exacerbation still on steroids 3. Hypertension on clonidine patch 4. Anxiety on numerous psychiatric medications.  MRI of the brain negative for stroke 5. Stage II decubitus ulcer on buttock.  Local wound care 6. Anemia.  Hemoglobin of 7.8.  Continue to monitor closely for need for transfusion. 7. Nutrition NG tube feeding.  May end up needing PEG.  Code Status:     Code Status Orders  (From admission, onward)        Start     Ordered   09/30/17 1946  Full code  Continuous     09/30/17 1945    Code Status History    Date Active Date Inactive Code Status Order ID Comments User Context   08/20/2017 1713 08/24/2017 1743 Full Code [id number]  Dustin Flock, MD Inpatient   06/29/2017 0625 07/02/2017 2008 Full Code [id number]  Harrie Foreman, MD Inpatient   02/23/2017 0241 02/26/2017 1634 Full Code [id number]  Harrie Foreman, MD Inpatient   01/01/2017 1144 01/04/2017 1354 Full Code [id number]  Bettey Costa, MD Inpatient   05/22/2016 2155 05/27/2016 1645 Full Code [id number]  Fritzi Mandes, MD Inpatient   04/23/2016 2020 04/27/2016 1848 Full Code [id number]  Hower, Aaron Mose, MD ED   02/07/2016 1922 02/10/2016 1835 Full Code [id number]  Gladstone Lighter, MD Inpatient   11/29/2015 0519 12/01/2015 1609 Full Code [id number]  Harrie Foreman, MD Inpatient   11/05/2015 0911 11/07/2015 1606 Full Code [id number]  Hillary Bow, MD ED   10/24/2014 1522 10/27/2014 1630 Full Code [id number]  Hillary Bow, MD Inpatient     Family Communication: As per critical care specialist Disposition Plan: To be determined  Time spent: 26 minutes, case discussed with critical care specialist and  [place]

## 2017-10-26 NOTE — Progress Notes (Signed)
Placed pt. On PS 14/5/24 at 0756,pt. Became labored/resp. Distress. Place pt. Back on prvc mode.

## 2017-10-27 LAB — BLOOD GAS, ARTERIAL
Acid-Base Excess: 8.9 mmol/L — ABNORMAL HIGH (ref 0.0–2.0)
BICARBONATE: 32.7 mmol/L — AB (ref 20.0–28.0)
FIO2: 0.24
MECHVT: 450 mL
O2 Saturation: 97.3 %
PEEP/CPAP: 5 cmH2O
Patient temperature: 37
RATE: 16 resp/min
pCO2 arterial: 41 mmHg (ref 32.0–48.0)
pH, Arterial: 7.51 — ABNORMAL HIGH (ref 7.350–7.450)
pO2, Arterial: 85 mmHg (ref 83.0–108.0)

## 2017-10-27 LAB — BASIC METABOLIC PANEL
Anion gap: 5 (ref 5–15)
BUN: 27 mg/dL — AB (ref 6–20)
CALCIUM: 8.9 mg/dL (ref 8.9–10.3)
CO2: 30 mmol/L (ref 22–32)
CREATININE: 0.35 mg/dL — AB (ref 0.44–1.00)
Chloride: 103 mmol/L (ref 101–111)
GFR calc Af Amer: >60 mL/min (ref >60)
GFR calc non Af Amer: >60 mL/min (ref >60)
GLUCOSE: 151 mg/dL — AB (ref 65–99)
Potassium: 3.5 mmol/L (ref 3.5–5.1)
Sodium: 138 mmol/L (ref 135–145)

## 2017-10-27 LAB — GLUCOSE, CAPILLARY
GLUCOSE-CAPILLARY: 130 mg/dL — AB (ref 65–99)
GLUCOSE-CAPILLARY: 148 mg/dL — AB (ref 65–99)
GLUCOSE-CAPILLARY: 135 mg/dL — AB (ref 65–99)
Glucose-Capillary: 142 mg/dL — ABNORMAL HIGH (ref 65–99)
Glucose-Capillary: 136 mg/dL — ABNORMAL HIGH (ref 65–99)
Glucose-Capillary: 139 mg/dL — ABNORMAL HIGH (ref 65–99)

## 2017-10-27 LAB — CBC
HEMATOCRIT: 25.8 % — AB (ref 35.0–47.0)
Hemoglobin: 8.4 g/dL — ABNORMAL LOW (ref 12.0–16.0)
MCH: 29.3 pg (ref 26.0–34.0)
MCHC: 32.4 g/dL (ref 32.0–36.0)
MCV: 90.4 fL (ref 80.0–100.0)
Platelets: 189 10*3/uL (ref 150–440)
RBC: 2.85 MIL/uL — ABNORMAL LOW (ref 3.80–5.20)
RDW: 16.9 % — AB (ref 11.5–14.5)
WBC: 7.2 10*3/uL (ref 3.6–11.0)

## 2017-10-27 LAB — MAGNESIUM: Magnesium: 1.8 mg/dL (ref 1.7–2.4)

## 2017-10-27 LAB — PHOSPHORUS: Phosphorus: 2.8 mg/dL (ref 2.5–4.6)

## 2017-10-27 MED ORDER — MORPHINE SULFATE (PF) 4 MG/ML IV SOLN
4.0000 mg | INTRAVENOUS | Status: DC | PRN
  Administered 2017-10-27 – 2017-10-28 (×2): 4 mg via INTRAVENOUS
  Filled 2017-10-27 (×3): qty 1

## 2017-10-27 MED ORDER — MORPHINE SULFATE (PF) 2 MG/ML IV SOLN: 2.0000 mg | INTRAVENOUS | Status: DC | PRN

## 2017-10-27 MED ORDER — ACETYLCYSTEINE 20 % IN SOLN
2.0000 mL | Freq: Four times a day (QID) | RESPIRATORY_TRACT | Status: DC
  Administered 2017-10-27 – 2017-10-28 (×5): 2 mL via RESPIRATORY_TRACT
  Filled 2017-10-27 (×5): qty 4

## 2017-10-27 MED ORDER — MORPHINE SULFATE (PF) 4 MG/ML IV SOLN
INTRAVENOUS | Status: AC
  Administered 2017-10-27: 4 mg
  Filled 2017-10-27: qty 1

## 2017-10-27 MED ORDER — MORPHINE SULFATE (PF) 4 MG/ML IV SOLN: 4.0000 mg | Freq: Once | INTRAVENOUS | Status: AC

## 2017-10-27 MED ORDER — POTASSIUM CHLORIDE 20 MEQ PO PACK
20.0000 meq | PACK | Freq: Once | ORAL | Status: AC
  Administered 2017-10-27: 20 meq via NASOGASTRIC
  Filled 2017-10-27: qty 1

## 2017-10-27 NOTE — Progress Notes (Signed)
   10/27/17 1615  Clinical Encounter Type  Visited With Patient  Visit Type Follow-up  Spiritual Encounters  Spiritual Needs Prayer   Chaplain prayed for patient at bedside.  Patient opened eyes and looked at chaplain.  Chaplain asked if patient would like prayer; patient closed her eyes and opened them again.  Chaplain prayed again for patient.  Patient opened her eyes, chaplain spoke of past visits and asked if she could come back again.  Patient did not respond verbally, patient kept gaze on chaplain then closed her eyes.

## 2017-10-27 NOTE — Progress Notes (Signed)
Patient ID: Michelle [name], female   DOB: 1960-10-15, 57 y.o.   MRN: [id number]   [facility] PROGRESS NOTE  Michelle [name] OIB:[id number] DOB: [birth year] DOA: 09/30/2017 PCP: [facility] [place]  HPI/Subjective: Patient able to open eyes but is pretty lethargic.  Able to follow few simple commands.  Objective: Vitals:   10/27/17 0800 10/27/17 0900  BP: (!) 169/109 (!) 163/100  Pulse: (!) 105 100  Resp: (!) 27 (!) 22  Temp:    SpO2: 98% 99%    Filed Weights   10/25/17 0500 10/26/17 0500 10/27/17 0600  Weight: 60.1 kg (132 lb 7.9 oz) 58.3 kg (128 lb 8.5 oz) 59.7 kg (131 lb 9.8 oz)    ROS: Review of Systems  Unable to perform ROS: Acuity of condition   Exam: Physical Exam  Constitutional: She appears lethargic.  HENT:  Nose: No mucosal edema.  Mouth/Throat: No oropharyngeal exudate.  Eyes: Pupils are equal, round, and reactive to light. Conjunctivae and lids are normal.  Neck: Carotid bruit is not present.  Cardiovascular: S1 normal and normal heart sounds.  Respiratory: No accessory muscle usage. She has decreased breath sounds in the right lower field and the left lower field. She has no wheezes. She has rhonchi in the right lower field and the left lower field.  GI: Soft. Bowel sounds are normal. There is no tenderness.  Musculoskeletal:       Right wrist: She exhibits swelling.       Left wrist: She exhibits swelling.       Right ankle: She exhibits swelling.       Left ankle: She exhibits swelling.  Lymphadenopathy:    She has no cervical adenopathy.  Neurological: She appears lethargic.  Able to barely squeeze my hand.  Skin: Skin is warm. No rash noted. Nails show no clubbing.  As per nursing staff stage II on buttock  Psychiatric:  Lethargic today      Data Reviewed: Basic Metabolic Panel: Recent Labs  Lab 10/21/17 0417 10/22/17 1540 10/23/17 0353 10/24/17 0338 10/27/17 0532  NA 135 135 137 135 138  K 4.0 4.3 3.8 3.9 3.5  CL 98* 97*  98* 98* 103  CO2 31 32 31 32 30  GLUCOSE 122* 197* 120* 173* 151*  BUN 21* 26* 23* 24* 27*  CREATININE <0.30* 0.36* <0.30* 0.31* 0.35*  CALCIUM 8.4* 8.5* 8.9 8.6* 8.9  MG  --   --   --   --  1.8  PHOS  --   --   --   --  2.8    CBC: Recent Labs  Lab 10/22/17 0543 10/23/17 0353 10/24/17 0338 10/26/17 0609 10/27/17 0532  WBC 7.8 8.7 6.8 7.0 7.2  NEUTROABS 6.7* 7.8* 6.4  --   --   HGB 8.8* 8.9* 8.0* 7.8* 8.4*  HCT 26.9* 26.9* 24.2* 24.0* 25.8*  MCV 90.0 89.8 89.6 90.2 90.4  PLT 170 181 149* 170 189   BNP (last 3 results) Recent Labs    06/29/17 0412 08/20/17 1337 09/30/17 1705  BNP 38.0 33.0 66.0     CBG: Recent Labs  Lab 10/26/17 1926 10/26/17 2313 10/27/17 0312 10/27/17 0717 10/27/17 1151  GLUCAP 120* 166* 142* 130* 136*    Recent Results (from the past 240 hour(s))  CULTURE, BLOOD (ROUTINE X 2) w Reflex to ID Panel     Status: Abnormal   Collection Time: 10/18/17 10:51 AM  Result Value Ref Range Status   Specimen Description  Final    BLOOD LEFT ANTECUBITAL Performed at [facility], [address] [place], [place] [address]    Special Requests   Final    BOTTLES DRAWN AEROBIC AND ANAEROBIC Blood Culture adequate volume Performed at [facility], [address] [place], [place] [address]    Culture  Setup Time   Final    GRAM POSITIVE COCCI ANAEROBIC BOTTLE ONLY CRITICAL RESULT CALLED TO, READ BACK BY AND VERIFIED WITH: MELISSA MACCIA 10/19/17 @ 5053  [place]    Culture (A)  Final    STAPHYLOCOCCUS SPECIES (COAGULASE NEGATIVE) THE SIGNIFICANCE OF ISOLATING THIS ORGANISM FROM A SINGLE SET OF BLOOD CULTURES WHEN MULTIPLE SETS ARE DRAWN IS UNCERTAIN. PLEASE NOTIFY THE MICROBIOLOGY DEPARTMENT WITHIN ONE WEEK IF SPECIATION AND SENSITIVITIES ARE REQUIRED. Performed at [facility], [address] [place], [place] [address]    Report Status 10/21/2017 FINAL  Final  Blood Culture ID Panel (Reflexed)     Status: Abnormal    Collection Time: 10/18/17 10:51 AM  Result Value Ref Range Status   Enterococcus species NOT DETECTED NOT DETECTED Final   Listeria monocytogenes NOT DETECTED NOT DETECTED Final   Staphylococcus species DETECTED (A) NOT DETECTED Final    Comment: Methicillin (oxacillin) resistant coagulase negative staphylococcus. Possible blood culture contaminant (unless isolated from more than one blood culture draw or clinical case suggests pathogenicity). No antibiotic treatment is indicated for blood  culture contaminants. CRITICAL RESULT CALLED TO, READ BACK BY AND VERIFIED WITH: MELISSA [place] 10/19/17 @ 4193  [place]    Staphylococcus aureus NOT DETECTED NOT DETECTED Final   Methicillin resistance DETECTED (A) NOT DETECTED Final    Comment: CRITICAL RESULT CALLED TO, READ BACK BY AND VERIFIED WITH: MELISSA MACCIA 10/19/17 @ 7902  [place]    Streptococcus species NOT DETECTED NOT DETECTED Final   Streptococcus agalactiae NOT DETECTED NOT DETECTED Final   Streptococcus pneumoniae NOT DETECTED NOT DETECTED Final   Streptococcus pyogenes NOT DETECTED NOT DETECTED Final   Acinetobacter baumannii NOT DETECTED NOT DETECTED Final   Enterobacteriaceae species NOT DETECTED NOT DETECTED Final   Enterobacter cloacae complex NOT DETECTED NOT DETECTED Final   Escherichia coli NOT DETECTED NOT DETECTED Final   Klebsiella oxytoca NOT DETECTED NOT DETECTED Final   Klebsiella pneumoniae NOT DETECTED NOT DETECTED Final   Proteus species NOT DETECTED NOT DETECTED Final   Serratia marcescens NOT DETECTED NOT DETECTED Final   Haemophilus influenzae NOT DETECTED NOT DETECTED Final   Neisseria meningitidis NOT DETECTED NOT DETECTED Final   Pseudomonas aeruginosa NOT DETECTED NOT DETECTED Final   Candida albicans NOT DETECTED NOT DETECTED Final   Candida glabrata NOT DETECTED NOT DETECTED Final   Candida krusei NOT DETECTED NOT DETECTED Final   Candida parapsilosis NOT DETECTED NOT DETECTED Final   Candida tropicalis NOT  DETECTED NOT DETECTED Final    Comment: Performed at [facility], [address] [place], [place] [address]  Culture, respiratory (NON-Expectorated)     Status: None   Collection Time: 10/18/17 11:09 AM  Result Value Ref Range Status   Specimen Description   Final    TRACHEAL ASPIRATE Performed at [facility], [address], [place], [place] [address]    Special Requests   Final    NONE Performed at [facility], [place], [place] [address]    Gram Stain   Final    MODERATE WBC PRESENT, PREDOMINANTLY PMN NO ORGANISMS SEEN Performed at [facility],  [address], [place], [place] [address]    Culture   Final    RARE CANDIDA DUBLINIENSIS RARE FUNGUS (MOLD) ISOLATED, PROBABLE CONTAMINANT/COLONIZER (SAPROPHYTE). CONTACT MICROBIOLOGY IF FURTHER IDENTIFICATION REQUIRED [phone].    Report Status 10/21/2017 FINAL  Final  CULTURE, BLOOD (ROUTINE X 2) w Reflex to ID Panel     Status: None   Collection Time: 10/18/17  4:09 PM  Result Value Ref Range Status   Specimen Description BLOOD L FA  Final   Special Requests   Final    BOTTLES DRAWN AEROBIC AND ANAEROBIC Blood Culture adequate volume   Culture   Final    NO GROWTH 5 DAYS Performed at [facility], [address] [place], [place] [address]    Report Status 10/23/2017 FINAL  Final  CULTURE, BLOOD (ROUTINE X 2) w Reflex to ID Panel     Status: None   Collection Time: 10/19/17  8:36 AM  Result Value Ref Range Status   Specimen Description BLOOD BLOOD LEFT HAND  Final   Special Requests   Final    BOTTLES DRAWN AEROBIC AND ANAEROBIC Blood Culture results may not be optimal due to an inadequate volume of blood received in culture bottles   Culture   Final    NO GROWTH 5 DAYS Performed at [facility], [address] [place], [place] [address]    Report Status 10/24/2017 FINAL  Final  CULTURE, BLOOD (ROUTINE X 2) w Reflex to ID Panel     Status: None    Collection Time: 10/19/17  8:52 AM  Result Value Ref Range Status   Specimen Description BLOOD A-LINE DRAW  Final   Special Requests   Final    BOTTLES DRAWN AEROBIC AND ANAEROBIC Blood Culture adequate volume   Culture   Final    NO GROWTH 5 DAYS Performed at [facility], [address] [place], [place] [address]    Report Status 10/24/2017 FINAL  Final  Culture, respiratory (NON-Expectorated)     Status: None   Collection Time: 10/23/17 11:36 PM  Result Value Ref Range Status   Specimen Description   Final    TRACHEAL ASPIRATE Performed at [facility], [address], [place], [place] [address]    Special Requests   Final    NONE Performed at [facility], [address] [place], [place] [address]    Gram Stain   Final    RARE WBC PRESENT, PREDOMINANTLY PMN NO ORGANISMS SEEN Performed at [facility], [place] [address], [place], [place] [address]    Culture   Final    Consistent with normal respiratory flora. FUNGUS (MOLD) ISOLATED, PROBABLE CONTAMINANT/COLONIZER (SAPROPHYTE). CONTACT MICROBIOLOGY IF FURTHER IDENTIFICATION REQUIRED [phone].    Report Status 10/26/2017 FINAL  Final      Scheduled Meds: . acetylcysteine  2 mL Nebulization Q6H  . ALPRAZolam  0.5 mg Per Tube TID  . aspirin  81 mg Oral Daily  . budesonide (PULMICORT) nebulizer solution  0.5 mg Nebulization BID  . chlorhexidine gluconate (MEDLINE KIT)  15 mL Mouth Rinse BID  . cloNIDine  0.3 mg Transdermal Weekly  . escitalopram  10 mg Per Tube Daily  . famotidine  20 mg Per Tube Daily  . fentaNYL  75 mcg Transdermal Q72H  . free water  100 mL Per Tube Q8H  . heparin  5,000 Units Subcutaneous Q8H  . insulin aspart  0-9 Units Subcutaneous Q4H  . ipratropium-albuterol  3 mL Nebulization  Q6H  . mouth rinse  15 mL Mouth Rinse 10 times per day  . methylPREDNISolone (SOLU-MEDROL) injection  40 mg Intravenous Q12H  . multivitamin  15 mL Per Tube Daily  . potassium  chloride  20 mEq Per NG tube Once  . QUEtiapine  50 mg Per Tube QHS  . risperiDONE  2 mg Per Tube QHS  . scopolamine  1 patch Transdermal Q72H  . senna-docusate  1 tablet Per Tube BID  . sodium chloride flush  10-40 mL Intracatheter Q12H   Continuous Infusions: . feeding supplement (VITAL AF 1.2 CAL) 1,000 mL (10/26/17 1501)  . fentaNYL infusion INTRAVENOUS Stopped (10/25/17 0700)  . small volume/piggyback builder      Assessment/Plan:  1. Acute on chronic hypoxic respiratory failure.  Patient now with tracheostomy on ventilator support.  The patient is currently on 24% FiO2. Patient so far is difficult to wean. 2. COPD exacerbation still on steroids 3. Hypertension on clonidine patch 4. Anxiety on numerous psychiatric medications.  MRI of the brain negative for stroke 5. Stage II decubitus ulcer on buttock.  Local wound care 6. Anemia.  Hemoglobin of 8.4.  Continue to monitor closely for need for transfusion. 7. Nutrition NG tube feeding.  May end up needing PEG. 8. Weakness.  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  Code Status:     Code Status Orders  (From admission, onward)        Start     Ordered   09/30/17 1946  Full code  Continuous     09/30/17 1945    Code Status History    Date Active Date Inactive Code Status Order ID Comments User Context   08/20/2017 1713 08/24/2017 1743 Full Code [id number]  Dustin Flock, MD Inpatient   06/29/2017 0625 07/02/2017 2008 Full Code [id number]  Harrie Foreman, MD Inpatient   02/23/2017 0241 02/26/2017 1634 Full Code [id number]  Harrie Foreman, MD Inpatient   01/01/2017 1144 01/04/2017 1354 Full Code [id number]  Bettey Costa, MD Inpatient   05/22/2016 2155 05/27/2016 1645 Full Code [id number]  Fritzi Mandes, MD Inpatient   04/23/2016 2020 04/27/2016 1848 Full Code [id number]  Hower, Aaron Mose, MD ED   02/07/2016 1922 02/10/2016 1835 Full Code [id number]  Gladstone Lighter, MD Inpatient   11/29/2015 0519 12/01/2015 1609 Full  Code [id number]  Harrie Foreman, MD Inpatient   11/05/2015 0911 11/07/2015 1606 Full Code [id number]  Hillary Bow, MD ED   10/24/2014 1522 10/27/2014 1630 Full Code [id number]  Hillary Bow, MD Inpatient     Family Communication: As per critical care specialist Disposition Plan: As per social work team  Time spent: 25 minutes, case discussed with [facility]

## 2017-10-27 NOTE — Progress Notes (Signed)
   CHIEF COMPLAINT:   Chief Complaint  Patient presents with  . Shortness of Breath  acute on chronic hypoxic/hypercapnic resp failure   Subjective  Remains on vent,sedated Has episodes of intermittent dysnoea with any interaction     Objective    On vent  Examination:  General exam: Appears calm and comfortable but episodal dysnoea when awake and interactive  Respiratory system: Clear to auscultation. Respiratory effort normal with episodes of excessive increase work of breathing with any activity. Thick secretions. HEENT: [place]/AT, PERRLA, no thrush, no stridor. Trach site moist with secretions Cardiovascular system: S1 & S2 heard, RRR. No JVD, murmurs, rubs, gallops or clicks. No pedal edema. Gastrointestinal system: Abdomen is nondistended, soft and nontender. No organomegaly or masses felt. Normal bowel sounds heard. Central nervous system: Alert at times. No focal neurological deficits. Extremities: Symmetric 4/5power. Skin: No rashes, lesions or ulcers Psychiatry: anxious when awake Neuro: appropriately  VITALS:  height is 4\' 9"  (1.448 m) and weight is 131 lb 9.8 oz (59.7 kg). Her axillary temperature is 97.7 F (36.5 C). Her blood pressure is 163/100 (abnormal) and her pulse is 100. Her respiration is 22 (abnormal) and oxygen saturation is 99%.   I personally reviewed Labs under Results section.  Radiology Reports No results found.     Assessment/Plan:  Acute/chronic respiratory failure Very severe COPD with acute exacerbation Prolonged mechanical ventilation Trach tube status - placed 05/16 Intermittent, severe agitation Complicated by acute neurological change Failure to wean from vent-placed back on PRVC mode Sedatives as needed Plan: ECHO with bubble study pending- R/O cardiac etiology.     Severe Hypoxic and Hypercapnic Respiratory Failure -continue Full MV support -continue Bronchodilator Therapy, add mucomyst to thin secretions -Wean Fio2 and  PEEP as tolerated - continue PS trial as tolerated   NEUROLOGY Nods appropriately EEG showed no seizure activity, MRI-  No acute intracranial abnormality. 2. Age-related cerebral atrophy with moderate chronic small vessel ischemic change involving the periventricular white matter and pons. Continue on Keppra for now  DVT/GI PRX ordered TRANSFUSIONS AS NEEDED MONITOR FSBS ASSESS the need for LABS as needed  Family: daughter updated   Critical Care Time devoted to patient care services described in this note is 37 minutes.   Overall, patient is critically ill, prognosis is guarded.  Patient with Multiorgan failure and at high risk for cardiac arrest and death.    Cammie Sickle, M.D.  [facility]

## 2017-10-28 LAB — GLUCOSE, CAPILLARY
GLUCOSE-CAPILLARY: 139 mg/dL — AB (ref 65–99)
GLUCOSE-CAPILLARY: 142 mg/dL — AB (ref 65–99)
GLUCOSE-CAPILLARY: 145 mg/dL — AB (ref 65–99)
Glucose-Capillary: 135 mg/dL — ABNORMAL HIGH (ref 65–99)
Glucose-Capillary: 148 mg/dL — ABNORMAL HIGH (ref 65–99)
Glucose-Capillary: 158 mg/dL — ABNORMAL HIGH (ref 65–99)
Glucose-Capillary: 141 mg/dL — ABNORMAL HIGH (ref 65–99)

## 2017-10-28 MED ORDER — MIDAZOLAM HCL 2 MG/2ML IJ SOLN
2.0000 mg | INTRAMUSCULAR | Status: DC | PRN
  Administered 2017-10-28 – 2017-10-29 (×3): 2 mg via INTRAVENOUS
  Filled 2017-10-28 (×2): qty 2

## 2017-10-28 MED ORDER — MIDAZOLAM HCL 2 MG/2ML IJ SOLN
INTRAMUSCULAR | Status: AC
  Administered 2017-10-28: 09:00:00
  Filled 2017-10-28: qty 2

## 2017-10-28 MED ORDER — MIDAZOLAM HCL 2 MG/2ML IJ SOLN
2.0000 mg | Freq: Once | INTRAMUSCULAR | Status: DC
  Filled 2017-10-28: qty 2

## 2017-10-28 MED ORDER — MORPHINE SULFATE (PF) 2 MG/ML IV SOLN
2.0000 mg | INTRAVENOUS | Status: DC | PRN
  Administered 2017-10-28 – 2017-11-06 (×14): 2 mg via INTRAVENOUS
  Filled 2017-10-28 (×15): qty 1

## 2017-10-28 NOTE — Progress Notes (Signed)
Name: Michelle [name] MRN: [id number] DOB: Apr 10, 1961     CONSULTATION DATE: 09/30/2017  Subjective & objectives: Continues on ventilator with frequent panic attacks and respiratory distress when off sedation.  Awaiting placement of PEG tube.  PAST MEDICAL HISTORY :   has a past medical history of Allergy, Anxiety, Asthma, CHF (congestive heart failure) ([place]), Cocaine abuse ([place]), COPD (chronic obstructive pulmonary disease) ([place]), Coronary artery disease, Emphysema/COPD ([place]), Hepatitis C, High cholesterol, Hypertension, Pneumothorax, and Tobacco abuse.  has a past surgical history that includes Cardiac catheterization; Chest tube insertion; and Tracheostomy tube placement (N/A, 10/17/2017). Prior to Admission medications   Medication Sig Start Date End Date Taking? Authorizing Provider  albuterol (PROVENTIL HFA;VENTOLIN HFA) 108 (90 Base) MCG/ACT inhaler Inhale 2 puffs into the lungs every 6 (six) hours as needed for wheezing or shortness of breath. 02/26/17  Yes Dustin Flock, MD  ALPRAZolam Duanne Moron) 0.5 MG tablet Take 1 tablet (0.5 mg total) by mouth 3 (three) times daily as needed for anxiety. 08/24/17  Yes Bettey Costa, MD  aspirin EC 81 MG tablet Take 81 mg by mouth daily.   Yes [provider]  ipratropium-albuterol (DUONEB) 0.5-2.5 (3) MG/3ML SOLN Take 3 mLs by nebulization every 6 (six) hours as needed. 01/04/17  Yes [name], MD  mirtazapine (REMERON) 30 MG tablet Take 30 mg by mouth at bedtime.   Yes [provider]  predniSONE (DELTASONE) 10 MG tablet Take 1 tablet (10 mg total) by mouth daily with breakfast. 08/24/17  Yes Mody, Sital, MD  SYMBICORT 160-4.5 MCG/ACT inhaler Take 2 puffs by mouth 2 (two) times daily.   Yes [provider]  tiotropium (SPIRIVA) 18 MCG inhalation capsule Place 1 capsule (18 mcg total) into inhaler and inhale daily. 01/04/17  Yes [name], MD  carvedilol (COREG) 3.125 MG tablet Take 1 tablet (3.125 mg  total) by mouth every morning. Patient not taking: Reported on 09/30/2017 02/10/16   Gladstone Lighter, MD  enalapril (VASOTEC) 10 MG tablet Take 0.5 tablets (5 mg total) by mouth daily. Patient not taking: Reported on 09/30/2017 02/10/16   Gladstone Lighter, MD  feeding supplement, ENSURE ENLIVE, (ENSURE ENLIVE) LIQD Take 237 mLs by mouth 2 (two) times daily between meals. 08/24/17   Bettey Costa, MD  guaiFENesin-codeine 100-10 MG/5ML syrup Take 10 mLs by mouth every 4 (four) hours as needed for cough. Patient not taking: Reported on 08/20/2017 07/02/17   Demetrios Loll, MD  levofloxacin (LEVAQUIN) 750 MG tablet Take 1 tablet (750 mg total) by mouth daily. Patient not taking: Reported on 09/30/2017 08/24/17   Bettey Costa, MD  polyethylene glycol (GOLYTELY) 236 g solution Drink one 8 oz glass every 20 mins until stools are clear starting at 5:00pm on 08/14/17 Patient not taking: Reported on 09/30/2017 07/26/17   Lucilla Lame, MD   Allergies  Allergen Reactions  . Carvedilol Other (See Comments)    Pt gets "sick" when on medication    FAMILY HISTORY:  family history includes Asthma in her mother; Breast cancer (age of onset: 47) in her maternal aunt; CAD in her father; COPD in her sister; CVA in her father; Lung cancer in her mother; Stroke in her father. SOCIAL HISTORY:  reports that she has been smoking cigarettes.  She has a 4.10 pack-year smoking history. She has never used smokeless tobacco. She reports that she has current or past drug history. Drug: "Crack" cocaine. She reports that she does not drink alcohol.  REVIEW OF SYSTEMS:   Unable to obtain  due to critical illness   VITAL SIGNS: Temp:  [97.1 F (36.2 C)-98.7 F (37.1 C)] 97.1 F (36.2 C) (05/27 1200) Pulse Rate:  [74-121] 95 (05/27 1200) Resp:  [13-27] 19 (05/27 1200) BP: (82-225)/(53-126) 102/61 (05/27 1200) SpO2:  [82 %-100 %] 94 % (05/27 1457) FiO2 (%):  [24 %] 24 % (05/27 1457) Weight:  [59.5 kg (131 lb 2.8 oz)] 59.5 kg (131 lb  2.8 oz) (05/27 0419)  Physical Examination:  Sedated and panicking when off sedation was respiratory distress.  Moves all extremities with generalized weakness however does not cooperate with full neurological exam Trach in place, on vent, bilateral air entry is equal with no adventitious sounds while sedated however once awake she is panicking with severe respiratory distress and no air movements. S1 & S2 are audible with no murmur Benign abdomen was normal peristalsis Sacral decub stage II.  With the extremities no leg edema   ASSESSMENT / PLAN:  Acute on chronic respiratory failure status post placement of tracheostomy on 10/17/2017.  Patient continued to have poor weaning parameters from the ventilator because of what seems his panic episodes with tapering sedation and CPAP pressure support trials  COPD with frequent exacerbations and reactive airway disease.  Anxiety and panic attacks.  As discussed with the family at the bedside that the patient has been having frequent and severe panic attacks for a long time and she was on Ativan.  MRI of the brain 5/23 did not show any acute intracranial abnormalities. -Xanax, risperidone, Seroquel, Lexapro and as needed Ativan -Consider psych evaluation to optimize antipsychotics.  Questionable seizure activity.  On Keppra  Sacral decub stage II -Wound care  Anemia -Keep hemoglobin more than 7 g/dL.  Deconditioning -Supportive care  Dysphagia awaiting placement of PEG for feeding  Follow with case management for placement  Full code  DVT & GI prophylaxis.  Continue with supportive care  I had a meeting with the family they were briefed about the patient updates and poor progress on weaning trials all their questions were answered.  Critical care time 40 minutes

## 2017-10-28 NOTE — Progress Notes (Signed)
Patient ID: Michelle [name], female   DOB: Oct 15, 1960, 57 y.o.   MRN: [id number]    [facility] PROGRESS NOTE  NEVAEH KORTE LXB:[id number] DOB: 14-Dec-1960 DOA: 09/30/2017 PCP: [facility]  HPI/Subjective: Patient able to open eyes but is pretty lethargic.  As per nursing staff the patient gets very anxious and heart rate goes up.  The patient needed to be medicated for her anxiety and fast heart rate.  Objective: Vitals:   10/28/17 1127 10/28/17 1200  BP:  102/61  Pulse:  95  Resp:  19  Temp:  (!) 97.1 F (36.2 C)  SpO2: 97% 97%    Filed Weights   10/26/17 0500 10/27/17 0600 10/28/17 0419  Weight: 58.3 kg (128 lb 8.5 oz) 59.7 kg (131 lb 9.8 oz) 59.5 kg (131 lb 2.8 oz)    ROS: Review of Systems  Unable to perform ROS: Acuity of condition   Exam: Physical Exam  Constitutional: She appears lethargic.  HENT:  Nose: No mucosal edema.  Mouth/Throat: No oropharyngeal exudate.  Eyes: Pupils are equal, round, and reactive to light. Conjunctivae and lids are normal.  Neck: Carotid bruit is not present.  Cardiovascular: S1 normal and normal heart sounds.  Respiratory: No accessory muscle usage. She has decreased breath sounds in the right lower field and the left lower field. She has no wheezes. She has rhonchi in the right lower field and the left lower field.  GI: Soft. Bowel sounds are normal. There is no tenderness.  Musculoskeletal:       Right wrist: She exhibits swelling.       Left wrist: She exhibits swelling.       Right ankle: She exhibits swelling.       Left ankle: She exhibits swelling.  Lymphadenopathy:    She has no cervical adenopathy.  Neurological: She appears lethargic.  Able to barely squeeze my hand.  Skin: Skin is warm. No rash noted. Nails show no clubbing.  As per nursing staff stage II on buttock  Psychiatric:  Lethargic today      Data Reviewed: Basic Metabolic Panel: Recent Labs  Lab 10/22/17 1540 10/23/17 0353  10/24/17 0338 10/27/17 0532  NA 135 137 135 138  K 4.3 3.8 3.9 3.5  CL 97* 98* 98* 103  CO2 32 31 32 30  GLUCOSE 197* 120* 173* 151*  BUN 26* 23* 24* 27*  CREATININE 0.36* <0.30* 0.31* 0.35*  CALCIUM 8.5* 8.9 8.6* 8.9  MG  --   --   --  1.8  PHOS  --   --   --  2.8    CBC: Recent Labs  Lab 10/22/17 0543 10/23/17 0353 10/24/17 0338 10/26/17 0609 10/27/17 0532  WBC 7.8 8.7 6.8 7.0 7.2  NEUTROABS 6.7* 7.8* 6.4  --   --   HGB 8.8* 8.9* 8.0* 7.8* 8.4*  HCT 26.9* 26.9* 24.2* 24.0* 25.8*  MCV 90.0 89.8 89.6 90.2 90.4  PLT 170 181 149* 170 189   BNP (last 3 results) Recent Labs    06/29/17 0412 08/20/17 1337 09/30/17 1705  BNP 38.0 33.0 66.0     CBG: Recent Labs  Lab 10/27/17 1542 10/27/17 1928 10/27/17 2316 10/28/17 0346 10/28/17 0753  GLUCAP 139* 148* 135* 142* 135*    Recent Results (from the past 240 hour(s))  CULTURE, BLOOD (ROUTINE X 2) w Reflex to ID Panel     Status: None   Collection Time: 10/18/17  4:09 PM  Result Value Ref Range  Status   Specimen Description BLOOD L FA  Final   Special Requests   Final    BOTTLES DRAWN AEROBIC AND ANAEROBIC Blood Culture adequate volume   Culture   Final    NO GROWTH 5 DAYS Performed at [facility], [address] [place], [place] [address]    Report Status 10/23/2017 FINAL  Final  CULTURE, BLOOD (ROUTINE X 2) w Reflex to ID Panel     Status: None   Collection Time: 10/19/17  8:36 AM  Result Value Ref Range Status   Specimen Description BLOOD BLOOD LEFT HAND  Final   Special Requests   Final    BOTTLES DRAWN AEROBIC AND ANAEROBIC Blood Culture results may not be optimal due to an inadequate volume of blood received in culture bottles   Culture   Final    NO GROWTH 5 DAYS Performed at [facility], [address] [place], [place] [address]    Report Status 10/24/2017 FINAL  Final  CULTURE, BLOOD (ROUTINE X 2) w Reflex to ID Panel     Status: None   Collection Time: 10/19/17  8:52 AM   Result Value Ref Range Status   Specimen Description BLOOD A-LINE DRAW  Final   Special Requests   Final    BOTTLES DRAWN AEROBIC AND ANAEROBIC Blood Culture adequate volume   Culture   Final    NO GROWTH 5 DAYS Performed at [facility], [address], [place], [place] [address]    Report Status 10/24/2017 FINAL  Final  Culture, respiratory (NON-Expectorated)     Status: None   Collection Time: 10/23/17 11:36 PM  Result Value Ref Range Status   Specimen Description   Final    TRACHEAL ASPIRATE Performed at [facility], [address], [place], [place] [address]    Special Requests   Final    NONE Performed at [facility], [address] [place], [place] [address]    Gram Stain   Final    RARE WBC PRESENT, PREDOMINANTLY PMN NO ORGANISMS SEEN Performed at [facility], [place] [address] [place], [place] [address]    Culture   Final    Consistent with normal respiratory flora. FUNGUS (MOLD) ISOLATED, PROBABLE CONTAMINANT/COLONIZER (SAPROPHYTE). CONTACT MICROBIOLOGY IF FURTHER IDENTIFICATION REQUIRED [phone].    Report Status 10/26/2017 FINAL  Final      Scheduled Meds: . acetylcysteine  2 mL Nebulization Q6H  . ALPRAZolam  0.5 mg Per Tube TID  . aspirin  81 mg Oral Daily  . budesonide (PULMICORT) nebulizer solution  0.5 mg Nebulization BID  . chlorhexidine gluconate (MEDLINE KIT)  15 mL Mouth Rinse BID  . cloNIDine  0.3 mg Transdermal Weekly  . escitalopram  10 mg Per Tube Daily  . famotidine  20 mg Per Tube Daily  . fentaNYL  75 mcg Transdermal Q72H  . free water  100 mL Per Tube Q8H  . heparin  5,000 Units Subcutaneous Q8H  . insulin aspart  0-9 Units Subcutaneous Q4H  . ipratropium-albuterol  3 mL Nebulization Q6H  . mouth rinse  15 mL Mouth Rinse 10 times per day  . methylPREDNISolone (SOLU-MEDROL) injection  40 mg Intravenous Q12H  . midazolam  2 mg Intravenous Once  . multivitamin  15 mL Per Tube Daily  . QUEtiapine   50 mg Per Tube QHS  . risperiDONE  2 mg Per Tube QHS  . scopolamine  1 patch Transdermal Q72H  . senna-docusate  1  tablet Per Tube BID  . sodium chloride flush  10-40 mL Intracatheter Q12H   Continuous Infusions: . feeding supplement (VITAL AF 1.2 CAL) 1,000 mL (10/28/17 0824)  . small volume/piggyback builder 100 mL/hr at 10/28/17 0830    Assessment/Plan:  1. Acute on chronic hypoxic respiratory failure.  Patient now with tracheostomy on ventilator support.  The patient is currently on 24% FiO2. Patient so far is difficult to wean. 2. COPD exacerbation still on steroids 3. Hypertension/tachycardia on clonidine patch.  Consider discontinuing clonidine patch and putting on metoprolol to control heart rate better 4. Anxiety on numerous psychiatric medications.  MRI of the brain negative for stroke. 5. Stage II decubitus ulcer on buttock.  Local wound care 6. Anemia.  Hemoglobin of 8.4.  Continue to monitor closely for need for transfusion. 7. Nutrition NG tube feeding.  May end up needing PEG. 8. Weakness.  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 Hypokalemia and hypomagnesemia replacement as per ICU protocol  Code Status:     Code Status Orders  (From admission, onward)        Start     Ordered   09/30/17 1946  Full code  Continuous     09/30/17 1945    Code Status History    Date Active Date Inactive Code Status Order ID Comments User Context   08/20/2017 1713 08/24/2017 1743 Full Code [id number]  Dustin Flock, MD Inpatient   06/29/2017 0625 07/02/2017 2008 Full Code [id number]  Harrie Foreman, MD Inpatient   02/23/2017 0241 02/26/2017 1634 Full Code [id number]  Harrie Foreman, MD Inpatient   01/01/2017 1144 01/04/2017 1354 Full Code [id number]  Bettey Costa, MD Inpatient   05/22/2016 2155 05/27/2016 1645 Full Code [id number]  Fritzi Mandes, MD Inpatient   04/23/2016 2020 04/27/2016 1848 Full Code [id number]  Hower, Aaron Mose, MD ED   02/07/2016 1922 02/10/2016 1835  Full Code [id number]  Gladstone Lighter, MD Inpatient   11/29/2015 0519 12/01/2015 1609 Full Code [id number]  Harrie Foreman, MD Inpatient   11/05/2015 0911 11/07/2015 1606 Full Code [id number]  Hillary Bow, MD ED   10/24/2014 1522 10/27/2014 1630 Full Code [id number]  Hillary Bow, MD Inpatient     Family Communication: As per critical care specialist Disposition Plan: As per social work team  Time spent: 24 minutes, case discussed with nursing  Darrin Apodaca [employer]

## 2017-10-28 NOTE — Progress Notes (Signed)
Patient has increased WOB and gasping this round despite vent support. Patient had this during the morning as well. Spoke to [employer] and Dr. Soyla Murphy about the possibility of mucomyst causing the increased WOB and gasping, as well as more diminished breath sounds post neb at 1400 rounds. Dr Soyla Murphy agreed with discontinuing mucomyst and Cyra RN administered medication to help calm patient.

## 2017-10-28 NOTE — Progress Notes (Signed)
PT Cancellation Note  Patient Details Name: Michelle [name] MRN: [id number] DOB: [birth year]   Cancelled Treatment:    Reason Eval/Treat Not Completed: Medical issues which prohibited therapy   Chart reviewed.  Discussed with nursing who requested hold of session today.  Will continue as appropriate.   Chesley Noon 10/28/2017, 2:33 PM

## 2017-10-28 NOTE — Progress Notes (Signed)
9am , pt became increasingly anxious, HR inc to 120, IV ativan given with little effect, increased secretions heard, but could not suction IVRobinul given and po meds given ( po Xanax). However HR kept escalating to 160-170s,  Morphine IV given with little effect.MD informed and stat dose of Versed given , which was effective

## 2017-10-28 NOTE — Consult Note (Addendum)
Consultation Note Date: 10/28/2017   Patient Name: Michelle [name]  DOB: 05-12-1961  MRN: [id number]  Age / Sex: 57 y.o., female  PCP: [facility] Referring Physician: Loletha Grayer, MD  Reason for Consultation: Establishing goals of care  HPI/Patient Profile:  Michelle [name]  is a 57 y.o. female with a known history of anxiety, CHF, cocaine abuse, COPD, coronary artery disease, hepatitis C, hypertension- as severely short of breath at home and so called EMS. On the way EMT gave her 5-6 breathing treatments but she was still extremely short of breath in the emergency room on arrival, so ER physician intubated her.   Clinical Assessment and Goals of Care: Daughter [name], husband and sister present for meeting. Ms. Duignan has 2 children. [name] lives with her mother and father. On a good day, she states she has to help bathe her and dress her; this has been the case for the past year. She has panic attacks or difficulty breathing daily. Her daughter states she takes Ativan every day and runs out before she is supposed to. She states on bad days she does not get out of the bed. Upon discussing her quality of life, her husband states initially a year ago when she began needing help, she was having difficulty with the situation, but then seemed to handle it well.    We discussed her diagnosis, prognosis, GOC, EOL wishes disposition and options.  The difference between an aggressive medical intervention path  and a hospice comfort care path was discussed.  Values and goals of care important to patient and family were attempted to be elicited.  Natural trajectory and expectations at EOL were discussed.    She states her sister has had frequent PNA, often discharging from one hospital and being admitted to another. She is concerned about skin breakdown, and going so far to the LTAC. She is concerned about  what her quality of life would be in the best case scenerio. Other family agrees. CCM in to update and answer questions. Concern for distress and anxiety on the ventilator.   Family to discuss PEG tube and time trial for continuing aggressive care vs comfort care as they did not anticipate her to need intubation for so long ( CCM conversation discussing Thanksgiving or Christmas). Family would like to continue ventilator at this time, they state "if her heart stops, let her go". They do not want resuscitation including shocks, chest compressions, or ACLS medications if her heart stops.      SUMMARY OF RECOMMENDATIONS    Family would like to continue ventilator at this time, they do not want resuscitation if her heart stops.   ? Psych eval as patient has anxiety on the vent. Hx of Ativan and recreational substance use.   Family re-meeting tomorrow at 1:00.    Code Status/Advance Care Planning:   No resuscitation if heart stops.     Symptom Management:   Per primary team.   Palliative Prophylaxis:   Eye Care and Oral Care  Prognosis:   Poor overall.   Discharge Planning: To Be Determined      Primary Diagnoses: Present on Admission: . Acute on chronic respiratory failure with hypoxia ([place]) . COPD exacerbation ([place]) . Acute on chronic respiratory failure ([place])   I have reviewed the medical record, interviewed the patient and family, and examined the patient. The following aspects are pertinent.  Past Medical History:  Diagnosis Date  . Allergy   . Anxiety   . Asthma   . CHF (congestive heart failure) ([place])   . Cocaine abuse ([place])   . COPD (chronic obstructive pulmonary disease) ([place])   . Coronary artery disease   . Emphysema/COPD ([place])   . Hepatitis C   . High cholesterol   . Hypertension   . Pneumothorax   . Tobacco abuse    Social History   Socioeconomic History  . Marital status: Single    Spouse name: Not on file  . Number of children: Not on file    . Years of education: Not on file  . Highest education level: Not on file  Occupational History  . Not on file  Social Needs  . Financial resource strain: Somewhat hard  . Food insecurity:    Worry: Sometimes true    Inability: Sometimes true  . Transportation needs:    Medical: Yes    Non-medical: Yes  Tobacco Use  . Smoking status: Current Every Day Smoker    Packs/day: 0.10    Years: 41.00    Pack years: 4.10    Types: Cigarettes  . Smokeless tobacco: Never Used  Substance and Sexual Activity  . Alcohol use: No    Frequency: Never  . Drug use: Yes    Types: "Crack" cocaine    Comment: Past cocaine use.  Quit 04/2017  . Sexual activity: Not Currently    Partners: Male    Birth control/protection: Post-menopausal  Lifestyle  . Physical activity:    Days per week: 0 days    Minutes per session: 0 min  . Stress: To some extent  Relationships  . Social connections:    Talks on phone: Patient refused    Gets together: Patient refused    Attends religious service: Patient refused    Active member of club or organization: Patient refused    Attends meetings of clubs or organizations: Patient refused    Relationship status: Patient refused  Other Topics Concern  . Not on file  Social History Narrative  . Not on file   Family History  Problem Relation Age of Onset  . Lung cancer Mother   . Asthma Mother   . CVA Father   . CAD Father   . Stroke Father   . Breast cancer Maternal Aunt 67  . COPD Sister    Scheduled Meds: . ALPRAZolam  0.5 mg Per Tube TID  . aspirin  81 mg Oral Daily  . budesonide (PULMICORT) nebulizer solution  0.5 mg Nebulization BID  . chlorhexidine gluconate (MEDLINE KIT)  15 mL Mouth Rinse BID  . cloNIDine  0.3 mg Transdermal Weekly  . escitalopram  10 mg Per Tube Daily  . famotidine  20 mg Per Tube Daily  . fentaNYL  75 mcg Transdermal Q72H  . free water  100 mL Per Tube Q8H  . heparin  5,000 Units Subcutaneous Q8H  . insulin aspart   0-9 Units Subcutaneous Q4H  . ipratropium-albuterol  3 mL Nebulization Q6H  . mouth rinse  15 mL Mouth Rinse  10 times per day  . methylPREDNISolone (SOLU-MEDROL) injection  40 mg Intravenous Q12H  . midazolam  2 mg Intravenous Once  . multivitamin  15 mL Per Tube Daily  . QUEtiapine  50 mg Per Tube QHS  . risperiDONE  2 mg Per Tube QHS  . scopolamine  1 patch Transdermal Q72H  . senna-docusate  1 tablet Per Tube BID  . sodium chloride flush  10-40 mL Intracatheter Q12H   Continuous Infusions: . feeding supplement (VITAL AF 1.2 CAL) 1,000 mL (10/28/17 0824)  . small volume/piggyback builder 100 mL/hr at 10/28/17 0830   PRN Meds:.acetaminophen (TYLENOL) oral liquid 160 mg/5 mL, albuterol, ALPRAZolam, bisacodyl, glycopyrrolate, hydrALAZINE, LORazepam, metoprolol tartrate, midazolam, morphine injection, sodium chloride flush, sodium chloride flush Medications Prior to Admission:  Prior to Admission medications   Medication Sig Start Date End Date Taking? Authorizing Provider  albuterol (PROVENTIL HFA;VENTOLIN HFA) 108 (90 Base) MCG/ACT inhaler Inhale 2 puffs into the lungs every 6 (six) hours as needed for wheezing or shortness of breath. 02/26/17  Yes Dustin Flock, MD  ALPRAZolam Duanne Moron) 0.5 MG tablet Take 1 tablet (0.5 mg total) by mouth 3 (three) times daily as needed for anxiety. 08/24/17  Yes Bettey Costa, MD  aspirin EC 81 MG tablet Take 81 mg by mouth daily.   Yes [provider]  ipratropium-albuterol (DUONEB) 0.5-2.5 (3) MG/3ML SOLN Take 3 mLs by nebulization every 6 (six) hours as needed. 01/04/17  Yes [name], MD  mirtazapine (REMERON) 30 MG tablet Take 30 mg by mouth at bedtime.   Yes [provider]  predniSONE (DELTASONE) 10 MG tablet Take 1 tablet (10 mg total) by mouth daily with breakfast. 08/24/17  Yes Mody, Sital, MD  SYMBICORT 160-4.5 MCG/ACT inhaler Take 2 puffs by mouth 2 (two) times daily.   Yes [provider]  tiotropium (SPIRIVA)  18 MCG inhalation capsule Place 1 capsule (18 mcg total) into inhaler and inhale daily. 01/04/17  Yes [name], MD  carvedilol (COREG) 3.125 MG tablet Take 1 tablet (3.125 mg total) by mouth every morning. Patient not taking: Reported on 09/30/2017 02/10/16   Gladstone Lighter, MD  enalapril (VASOTEC) 10 MG tablet Take 0.5 tablets (5 mg total) by mouth daily. Patient not taking: Reported on 09/30/2017 02/10/16   Gladstone Lighter, MD  feeding supplement, ENSURE ENLIVE, (ENSURE ENLIVE) LIQD Take 237 mLs by mouth 2 (two) times daily between meals. 08/24/17   Bettey Costa, MD  guaiFENesin-codeine 100-10 MG/5ML syrup Take 10 mLs by mouth every 4 (four) hours as needed for cough. Patient not taking: Reported on 08/20/2017 07/02/17   Demetrios Loll, MD  levofloxacin (LEVAQUIN) 750 MG tablet Take 1 tablet (750 mg total) by mouth daily. Patient not taking: Reported on 09/30/2017 08/24/17   Bettey Costa, MD  polyethylene glycol (GOLYTELY) 236 g solution Drink one 8 oz glass every 20 mins until stools are clear starting at 5:00pm on 08/14/17 Patient not taking: Reported on 09/30/2017 07/26/17   Lucilla Lame, MD   Allergies  Allergen Reactions  . Carvedilol Other (See Comments)    Pt gets "sick" when on medication   Review of Systems  Unable to perform ROS   Physical Exam  Constitutional:  Distress noted until medication given.   Pulmonary/Chest:  Trach    Vital Signs: BP 102/61   Pulse 95   Temp (!) 97.1 F (36.2 C)   Resp 19   Ht _0  (1.448 m)   Wt 59.5 kg (131 lb 2.8 oz)  LMP 03/06/2005 (Approximate)   SpO2 94%   BMI 28.39 kg/m  Pain Scale: CPOT POSS *See Group Information*: 2-Acceptable,Slightly drowsy, easily aroused Pain Score: Asleep   SpO2: SpO2: 94 % O2 Device:SpO2: 94 % O2 Flow Rate: .   IO: Intake/output summary:   Intake/Output Summary (Last 24 hours) at 10/28/2017 1638 Last data filed at 10/28/2017 1120 Gross per 24 hour  Intake 2115 ml  Output 1000 ml  Net 1115  ml    LBM: Last BM Date: 10/25/17 Baseline Weight: Weight: 45.7 kg (100 lb 12.8 oz) Most recent weight: Weight: 59.5 kg (131 lb 2.8 oz)     Palliative Assessment/Data: Lurline Idol     Time In: 3:10 Time Out: 4:30 Time Total: 80 min Greater than 50%  of this time was spent counseling and coordinating care related to the above assessment and plan.  Signed by: Asencion Gowda, NP   Please contact Palliative Medicine Team phone at [phone] for questions and concerns.  For individual provider: See Shea Evans

## 2017-10-29 ENCOUNTER — Inpatient Hospital Stay: Payer: Medicaid Other

## 2017-10-29 DIAGNOSIS — F410 Panic disorder [episodic paroxysmal anxiety] without agoraphobia: Secondary | ICD-10-CM

## 2017-10-29 LAB — GLUCOSE, CAPILLARY
GLUCOSE-CAPILLARY: 138 mg/dL — AB (ref 65–99)
GLUCOSE-CAPILLARY: 108 mg/dL — AB (ref 65–99)
Glucose-Capillary: 137 mg/dL — ABNORMAL HIGH (ref 65–99)
Glucose-Capillary: 133 mg/dL — ABNORMAL HIGH (ref 65–99)
Glucose-Capillary: 136 mg/dL — ABNORMAL HIGH (ref 65–99)
Glucose-Capillary: 112 mg/dL — ABNORMAL HIGH (ref 65–99)

## 2017-10-29 LAB — COMPREHENSIVE METABOLIC PANEL
ALBUMIN: 2.3 g/dL — AB (ref 3.5–5.0)
ALT: 40 U/L (ref 14–54)
ANION GAP: 5 (ref 5–15)
AST: 17 U/L (ref 15–41)
Alkaline Phosphatase: 69 U/L (ref 38–126)
BUN: 25 mg/dL — ABNORMAL HIGH (ref 6–20)
CO2: 30 mmol/L (ref 22–32)
Calcium: 8.8 mg/dL — ABNORMAL LOW (ref 8.9–10.3)
Chloride: 102 mmol/L (ref 101–111)
Glucose, Bld: 153 mg/dL — ABNORMAL HIGH (ref 65–99)
Potassium: 3.8 mmol/L (ref 3.5–5.1)
Sodium: 137 mmol/L (ref 135–145)
Total Bilirubin: 0.4 mg/dL (ref 0.3–1.2)
Total Protein: 5 g/dL — ABNORMAL LOW (ref 6.5–8.1)

## 2017-10-29 LAB — CBC WITH DIFFERENTIAL/PLATELET
Basophils Absolute: 0 10*3/uL (ref 0–0.1)
Basophils Relative: 0 %
Eosinophils Absolute: 0 10*3/uL (ref 0–0.7)
Eosinophils Relative: 0 %
HEMATOCRIT: 23.5 % — AB (ref 35.0–47.0)
HEMOGLOBIN: 7.6 g/dL — AB (ref 12.0–16.0)
LYMPHS ABS: 0.3 10*3/uL — AB (ref 1.0–3.6)
Lymphocytes Relative: 5 %
MCH: 29.5 pg (ref 26.0–34.0)
MCHC: 32.4 g/dL (ref 32.0–36.0)
MCV: 91 fL (ref 80.0–100.0)
MONOS PCT: 3 %
Monocytes Absolute: 0.2 10*3/uL (ref 0.2–0.9)
NEUTROS ABS: 6.3 10*3/uL (ref 1.4–6.5)
NEUTROS PCT: 92 %
Platelets: 193 10*3/uL (ref 150–440)
RBC: 2.58 MIL/uL — ABNORMAL LOW (ref 3.80–5.20)
RDW: 17 % — ABNORMAL HIGH (ref 11.5–14.5)
WBC: 6.8 10*3/uL (ref 3.6–11.0)

## 2017-10-29 MED ORDER — METHYLPREDNISOLONE SODIUM SUCC 40 MG IJ SOLR: 40.0000 mg | Freq: Every day | INTRAMUSCULAR | Status: DC

## 2017-10-29 MED ORDER — CLONAZEPAM 0.125 MG PO TBDP
0.5000 mg | ORAL_TABLET | Freq: Three times a day (TID) | ORAL | Status: DC
  Administered 2017-10-29 – 2017-10-30 (×5): 0.5 mg via ORAL
  Filled 2017-10-29 (×5): qty 4

## 2017-10-29 MED ORDER — PAROXETINE HCL 10 MG PO TABS
10.0000 mg | ORAL_TABLET | Freq: Two times a day (BID) | ORAL | Status: DC
  Administered 2017-10-29 – 2017-10-30 (×3): 10 mg
  Filled 2017-10-29 (×5): qty 1

## 2017-10-29 NOTE — Progress Notes (Signed)
Name: Michelle [name] MRN: [id number] DOB: [birth year]     CONSULTATION DATE: 09/30/2017 Subjective & objectives: No major events last night.  Patient was noticed to have good tidal volume was normal airway pressure while she is having what seems like panic episodes and got a breathing.   PAST MEDICAL HISTORY :   has a past medical history of Allergy, Anxiety, Asthma, CHF (congestive heart failure) ([place]), Cocaine abuse ([place]), COPD (chronic obstructive pulmonary disease) ([place]), Coronary artery disease, Emphysema/COPD ([place]), Hepatitis C, High cholesterol, Hypertension, Pneumothorax, and Tobacco abuse.  has a past surgical history that includes Cardiac catheterization; Chest tube insertion; and Tracheostomy tube placement (N/A, 10/17/2017). Prior to Admission medications   Medication Sig Start Date End Date Taking? Authorizing Provider  albuterol (PROVENTIL HFA;VENTOLIN HFA) 108 (90 Base) MCG/ACT inhaler Inhale 2 puffs into the lungs every 6 (six) hours as needed for wheezing or shortness of breath. 02/26/17  Yes Dustin Flock, MD  ALPRAZolam Duanne Moron) 0.5 MG tablet Take 1 tablet (0.5 mg total) by mouth 3 (three) times daily as needed for anxiety. 08/24/17  Yes Bettey Costa, MD  aspirin EC 81 MG tablet Take 81 mg by mouth daily.   Yes [provider]  ipratropium-albuterol (DUONEB) 0.5-2.5 (3) MG/3ML SOLN Take 3 mLs by nebulization every 6 (six) hours as needed. 01/04/17  Yes [name], MD  mirtazapine (REMERON) 30 MG tablet Take 30 mg by mouth at bedtime.   Yes [provider]  predniSONE (DELTASONE) 10 MG tablet Take 1 tablet (10 mg total) by mouth daily with breakfast. 08/24/17  Yes Mody, Sital, MD  SYMBICORT 160-4.5 MCG/ACT inhaler Take 2 puffs by mouth 2 (two) times daily.   Yes [provider]  tiotropium (SPIRIVA) 18 MCG inhalation capsule Place 1 capsule (18 mcg total) into inhaler and inhale daily. 01/04/17  Yes [name], MD  carvedilol (COREG)  3.125 MG tablet Take 1 tablet (3.125 mg total) by mouth every morning. Patient not taking: Reported on 09/30/2017 02/10/16   Gladstone Lighter, MD  enalapril (VASOTEC) 10 MG tablet Take 0.5 tablets (5 mg total) by mouth daily. Patient not taking: Reported on 09/30/2017 02/10/16   Gladstone Lighter, MD  feeding supplement, ENSURE ENLIVE, (ENSURE ENLIVE) LIQD Take 237 mLs by mouth 2 (two) times daily between meals. 08/24/17   Bettey Costa, MD  guaiFENesin-codeine 100-10 MG/5ML syrup Take 10 mLs by mouth every 4 (four) hours as needed for cough. Patient not taking: Reported on 08/20/2017 07/02/17   Demetrios Loll, MD  levofloxacin (LEVAQUIN) 750 MG tablet Take 1 tablet (750 mg total) by mouth daily. Patient not taking: Reported on 09/30/2017 08/24/17   Bettey Costa, MD  polyethylene glycol (GOLYTELY) 236 g solution Drink one 8 oz glass every 20 mins until stools are clear starting at 5:00pm on 08/14/17 Patient not taking: Reported on 09/30/2017 07/26/17   Lucilla Lame, MD   Allergies  Allergen Reactions  . Carvedilol Other (See Comments)    Pt gets "sick" when on medication    FAMILY HISTORY:  family history includes Asthma in her mother; Breast cancer (age of onset: 110) in her maternal aunt; CAD in her father; COPD in her sister; CVA in her father; Lung cancer in her mother; Stroke in her father. SOCIAL HISTORY:  reports that she has been smoking cigarettes.  She has a 4.10 pack-year smoking history. She has never used smokeless tobacco. She reports that she has current or past drug history. Drug: "Crack" cocaine. She reports that she does  not drink alcohol.  REVIEW OF SYSTEMS:   Unable to obtain due to critical illness   VITAL SIGNS: Temp:  [97.1 F (36.2 C)-98.8 F (37.1 C)] 98.8 F (37.1 C) (05/28 0400) Pulse Rate:  [69-121] 69 (05/28 0600) Resp:  [15-34] 17 (05/28 0600) BP: (92-225)/(61-126) 130/79 (05/28 0600) SpO2:  [82 %-100 %] 100 % (05/28 0600) FiO2 (%):  [24 %] 24 % (05/28 0409) Weight:   [60 kg (132 lb 4.4 oz)] 60 kg (132 lb 4.4 oz) (05/28 0356)  Physical Examination:  Awake and following commands, frequent episodes of panic with respiratory distress.  Moves all extremities with generalized weakness however does not cooperate with full neurological exam Trach in place, on vent, bilateral air entry is equal with no adventitious sounds while sedated however once awake she is panicking with severe respiratory distress and no air movements. S1 & S2 are audible with no murmur Benign abdomen was normal peristalsis Sacral decub stage II.  With the extremities no leg edema    ASSESSMENT / PLAN:   Acute on chronic respiratory failure status post placement of tracheostomy on 10/17/2017.  Patient continued to have poor weaning parameters from the ventilator because of what seems to be panic episodes with tapering sedation and CPAP pressure support trials.  -Vent and trach weaning as tolerated.  COPD with frequent exacerbations and reactive airway disease. -Bronchial dilators + inhaled steroids  Anxiety and panic attacks.  As discussed with the family at the bedside that the patient has been having frequent and severe panic attacks for a long time and she was on Ativan.  MRI of the brain 5/23 did not show any acute intracranial abnormalities. -Xanax, risperidone, Seroquel, Lexapro and as needed Ativan -Consider psych evaluation to optimize antipsychotics.   HTN.  Echo in 2017 LVEF 55 to 60% -Optimize antihypertensives and monitor hemodynamics.  Questionable seizure activity.  On Keppra  Sacral decub stage II -Wound care  Anemia -Keep hemoglobin more than 7 g/dL.  Deconditioning -Supportive care  Dysphagia awaiting placement of PEG for feeding  Follow with case management for placement  Full code  DVT & GI prophylaxis.  Continue with supportive care   Critical care time 30 minutes

## 2017-10-29 NOTE — Progress Notes (Signed)
Patient ID: Michelle [name], female   DOB: 01/19/1961, 57 y.o.   MRN: [id number]    [facility] PROGRESS NOTE  Michelle [name] MRN:[id number] DOB: 09/12/1960 DOA: 09/30/2017 PCP: [facility]  HPI/Subjective: Patient was seen this morning.  She is lethargic and barely able to open her eyes today.  Objective: Vitals:   10/29/17 1300 10/29/17 1400  BP: (!) 138/91 111/79  Pulse: 92 96  Resp:  20  Temp:    SpO2: 99% 96%    Filed Weights   10/27/17 0600 10/28/17 0419 10/29/17 0356  Weight: 59.7 kg (131 lb 9.8 oz) 59.5 kg (131 lb 2.8 oz) 60 kg (132 lb 4.4 oz)    ROS: Review of Systems  Unable to perform ROS: Acuity of condition   Exam: Physical Exam  Constitutional: She appears lethargic.  HENT:  Nose: No mucosal edema.  Mouth/Throat: No oropharyngeal exudate.  Eyes: Pupils are equal, round, and reactive to light. Conjunctivae and lids are normal.  Neck: Carotid bruit is not present.  Cardiovascular: S1 normal and normal heart sounds.  Respiratory: No accessory muscle usage. She has decreased breath sounds in the right lower field and the left lower field. She has no wheezes. She has rhonchi in the right lower field and the left lower field.  GI: Soft. Bowel sounds are normal. There is no tenderness.  Musculoskeletal:       Right wrist: She exhibits swelling.       Left wrist: She exhibits swelling.       Right ankle: She exhibits swelling.       Left ankle: She exhibits swelling.  Lymphadenopathy:    She has no cervical adenopathy.  Neurological: She appears lethargic.  Skin: Skin is warm. No rash noted. Nails show no clubbing.  As per nursing staff stage II on buttock  Psychiatric:  Lethargic today      Data Reviewed: Basic Metabolic Panel: Recent Labs  Lab 10/22/17 1540 10/23/17 0353 10/24/17 0338 10/27/17 0532 10/29/17 0408  NA 135 137 135 138 137  K 4.3 3.8 3.9 3.5 3.8  CL 97* 98* 98* 103 102  CO2 32 31 32 30 30  GLUCOSE 197* 120*  173* 151* 153*  BUN 26* 23* 24* 27* 25*  CREATININE 0.36* <0.30* 0.31* 0.35* <0.30*  CALCIUM 8.5* 8.9 8.6* 8.9 8.8*  MG  --   --   --  1.8  --   PHOS  --   --   --  2.8  --     CBC: Recent Labs  Lab 10/23/17 0353 10/24/17 0338 10/26/17 0609 10/27/17 0532 10/29/17 0408  WBC 8.7 6.8 7.0 7.2 6.8  NEUTROABS 7.8* 6.4  --   --  6.3  HGB 8.9* 8.0* 7.8* 8.4* 7.6*  HCT 26.9* 24.2* 24.0* 25.8* 23.5*  MCV 89.8 89.6 90.2 90.4 91.0  PLT 181 149* 170 189 193   BNP (last 3 results) Recent Labs    06/29/17 0412 08/20/17 1337 09/30/17 1705  BNP 38.0 33.0 66.0     CBG: Recent Labs  Lab 10/28/17 1913 10/28/17 2344 10/29/17 0356 10/29/17 0805 10/29/17 1146  GLUCAP 141* 139* 137* 133* 136*    Recent Results (from the past 240 hour(s))  Culture, respiratory (NON-Expectorated)     Status: None   Collection Time: 10/23/17 11:36 PM  Result Value Ref Range Status   Specimen Description   Final    TRACHEAL ASPIRATE Performed at [facility], [address] [place],   [place] [address]    Special Requests   Final    NONE Performed at [facility], [address] [place], [place] [address]    Gram Stain   Final    RARE WBC PRESENT, PREDOMINANTLY PMN NO ORGANISMS SEEN Performed at [facility], [address] [place], [place] [address]    Culture   Final    Consistent with normal respiratory flora. FUNGUS (MOLD) ISOLATED, PROBABLE CONTAMINANT/COLONIZER (SAPROPHYTE). CONTACT MICROBIOLOGY IF FURTHER IDENTIFICATION REQUIRED [phone].    Report Status 10/26/2017 FINAL  Final      Scheduled Meds: . ALPRAZolam  0.5 mg Per Tube TID  . aspirin  81 mg Oral Daily  . budesonide (PULMICORT) nebulizer solution  0.5 mg Nebulization BID  . chlorhexidine gluconate (MEDLINE KIT)  15 mL Mouth Rinse BID  . cloNIDine  0.3 mg Transdermal Weekly  . escitalopram  10 mg Per Tube Daily  . famotidine  20 mg Per Tube Daily  . fentaNYL  75 mcg Transdermal Q72H  .  free water  100 mL Per Tube Q8H  . heparin  5,000 Units Subcutaneous Q8H  . insulin aspart  0-9 Units Subcutaneous Q4H  . ipratropium-albuterol  3 mL Nebulization Q6H  . mouth rinse  15 mL Mouth Rinse 10 times per day  . midazolam  2 mg Intravenous Once  . multivitamin  15 mL Per Tube Daily  . QUEtiapine  50 mg Per Tube QHS  . risperiDONE  2 mg Per Tube QHS  . scopolamine  1 patch Transdermal Q72H  . senna-docusate  1 tablet Per Tube BID  . sodium chloride flush  10-40 mL Intracatheter Q12H   Continuous Infusions: . feeding supplement (VITAL AF 1.2 CAL) 45 mL/hr at 10/29/17 0600  . small volume/piggyback builder 100 mL/hr at 10/28/17 0830    Assessment/Plan:  1. Acute on chronic hypoxic respiratory failure.  Patient now with tracheostomy on ventilator support.  The patient is currently on 24% FiO2. Patient so far is difficult to wean. 2. COPD exacerbation still on steroids 3. Hypertension/tachycardia on clonidine patch.  Consider discontinuing clonidine patch and putting on metoprolol to control heart rate better 4. Anxiety on numerous psychiatric medications.  MRI of the brain negative for stroke. 5. Stage II decubitus ulcer on buttock.  Local wound care 6. Anemia.  Hemoglobin of  7.6.  Continue to monitor closely for need for transfusion. 7. Nutrition NG tube feeding.  Will need PEG prior to transportation to rehab center. 8. Weakness.  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 Hypokalemia and hypomagnesemia replacement as per ICU protocol  Code Status:     Code Status Orders  (From admission, onward)        Start     Ordered   09/30/17 1946  Full code  Continuous     09/30/17 1945    Code Status History    Date Active Date Inactive Code Status Order ID Comments User Context   08/20/2017 1713 08/24/2017 1743 Full Code [id number]  Patel, Shreyang, MD Inpatient   06/29/2017 0625 07/02/2017 2008 Full Code [id number]  Diamond, Michael S, MD Inpatient    02/23/2017 0241 02/26/2017 1634 Full Code [id number]  Diamond, Michael S, MD Inpatient   01/01/2017 1144 01/04/2017 1354 Full Code [id number]  Mody, Sital, MD Inpatient   05/22/2016 2155 05/27/2016 1645 Full Code [id number]  Patel, Sona, MD Inpatient   04/23/2016 2020 04/27/2016 1848 Full Code [id number]  Hower, David K, MD ED     02/07/2016 1922 02/10/2016 [place] Full Code [id number]  Gladstone Lighter, MD Inpatient   11/29/2015 0519 12/01/2015 1609 Full Code [id number]  Harrie Foreman, MD Inpatient   11/05/2015 0911 11/07/2015 1606 Full Code [id number]  Hillary Bow, MD ED   10/24/2014 1522 10/27/2014 1630 Full Code [id number]  Hillary Bow, MD Inpatient     Family Communication: As per critical care specialist Disposition Plan: As per social work team  Time spent: 22 minutes  [place]

## 2017-10-29 NOTE — Clinical Social Work Note (Addendum)
CSW met with patient's husband, daughters, sister with Palliative Care this afternoon. Sonia Baller with [facility] was also present. Patient's family has chosen to pursue aggressive care and proceed with PEG tube placement and transfer to [facility] once [facility] has finalized paperwork/medicaid. PEG will also have to be placed prior to discharge. In addition, patient's significant other just informed Palliative Care that he is not married to patient and thus decision making would defer to the patient's daughters. Shela Leff MSW,LCSW [phone]

## 2017-10-29 NOTE — Progress Notes (Signed)
PT Cancellation Note  Patient Details Name: Michelle [name] MRN: [id number] DOB: September 02, 1960   Cancelled Treatment:    Reason Eval/Treat Not Completed: Patient's level of consciousness   Attempted session this am.  Primary nurse reports she was given medication for anxiety and was lethargic this am.  Pt sleeping soundly.  She stated that she is receiving ROM with nursing staff.  Will continue as appropriate.   Chesley Noon 10/29/2017, 9:50 AM

## 2017-10-29 NOTE — Progress Notes (Signed)
Physical Therapy Treatment Patient Details Name: Michelle [name] MRN: [id number] DOB: 09-Mar-1961 Today's Date: 10/29/2017    History of Present Illness Pt is a 57 y/o F who presented with severe SOB with COPD exacerbation.  Chest x-ray was clear, patient was slightly hypotensive after intubation, admitted to the ICU.  Trach tube placed 5/16.  Pt demonstrated seizure like activity on 5/23, MRI negative, EEG abnormal but no epileptiform activity.  Pt's PMH includes cocaine abuse, COPD, CAD, hepatitis C, CHF.      PT Comments    Pt lethargic, but able to respond to yes/no questions with slight head nod; agreeable to PT. Pt demonstrates no active movement; PROM performed to BLE, and minimally on UE's. Pt reports position is comfortable. Continue PT to progress pt participation, endurance and strength to allow for improved function.   Follow Up Recommendations  SNF     Equipment Recommendations       Recommendations for Other Services       Precautions / Restrictions Precautions Precautions: Fall;Other (comment) Restrictions Weight Bearing Restrictions: No    Mobility  Bed Mobility                  Transfers                    Ambulation/Gait                 Stairs             Wheelchair Mobility    Modified Rankin (Stroke Patients Only)       Balance                                            Cognition Arousal/Alertness: Lethargic Behavior During Therapy: WFL for tasks assessed/performed;Flat affect                                   General Comments: Only demonstrates ability to nod head yes/no and does mouth some words      Exercises Other Exercises Other Exercises: BLE PROM and ankle, hip and knee, BUE wrist and LUE elbow x 14 min    General Comments        Pertinent Vitals/Pain Pain Assessment: (Nods head "yes" to pain. Also "yes" to "all over")    Home Living                       Prior Function            PT Goals (current goals can now be found in the care plan section) Progress towards PT goals: Not progressing toward goals - comment    Frequency    Min 2X/week      PT Plan Current plan remains appropriate    Co-evaluation              AM-PAC PT "6 Clicks" Daily Activity  Outcome Measure  Difficulty turning over in bed (including adjusting bedclothes, sheets and blankets)?: Unable Difficulty moving from lying on back to sitting on the side of the bed? : Unable Difficulty sitting down on and standing up from a chair with arms (e.g., wheelchair, bedside commode, etc,.)?: Unable Help needed moving to and from a bed to chair (including a wheelchair)?: Total Help needed walking  in hospital room?: Total Help needed climbing 3-5 steps with a railing? : Total 6 Click Score: 6    End of Session   Activity Tolerance: Patient limited by lethargy;Other (comment)(weakness) Patient left: in bed;with call bell/phone within reach   PT Visit Diagnosis: Muscle weakness (generalized) (M62.81);Difficulty in walking, not elsewhere classified (R26.2)     Time: 0312-8118 PT Time Calculation (min) (ACUTE ONLY): 14 min  Charges:  $Therapeutic Exercise: 8-22 mins                    G Codes:        Larae Grooms, PTA 10/29/2017, 3:50 PM

## 2017-10-29 NOTE — Care Management (Signed)
Patient will need permanent feeding access such as PEG placement prior to transfer to vent/SNF. No payer for LTAC unfortunately.

## 2017-10-29 NOTE — Consult Note (Signed)
[facility] Face-to-Face Psychiatry Consult   Reason for Consult: Consult for 57 year old woman with a history of anxiety disorder.  Patient is now on long-term tracheostomy ventilation and continues to have frequent spells of anxiety. Referring Physician: Leslye Peer Patient Identification: Michelle [name] MRN:  [id number] Principal Diagnosis: Panic disorder Diagnosis:   Patient Active Problem List   Diagnosis Date Noted  . Panic disorder [F41.0] 10/29/2017  . Altered mental status [R41.82]   . Pressure injury of skin [L89.90] 10/23/2017  . Acute on chronic respiratory failure ([place]) [J96.20] 09/30/2017  . Acute respiratory failure ([place]) [J96.00] 08/20/2017  . Acute on chronic respiratory failure with hypoxia and hypercapnia (HCC) [W10.93, J96.22] 06/29/2017  . Acute on chronic respiratory failure with hypoxia ([place]) [J96.21] 02/23/2017  . Protein-calorie malnutrition, severe [E43] 05/23/2016  . Sepsis ([place]) [A41.9] 05/22/2016  . Malnutrition of moderate degree ([place]) [E44.0] 10/25/2014  . COPD exacerbation ([place]) [J44.1] 10/24/2014  . Tobacco abuse [Z72.0] 10/24/2014    Total Time spent with patient: 1 hour  Subjective:   Michelle [name] is a 57 y.o. female patient admitted with patient is not able to give any information.  She is not able to communicate verbally.Marland Kitchen  HPI: Chart reviewed.  Spoke with nursing.  Looked back over a lot of the notes during this hospital stay.  57 year old woman who presented to the hospital just about a month ago with acute respiratory failure from COPD.  Had to be ventilated in the emergency room and was never able to be extubated.  Ultimately tracheostomy was placed.  Patient is now stable but still on a ventilator and plans are being made for transport to a longer term facility.  Treatment staff notes that the patient continues to have frequent spells of anxiety.  The panic attacks consist of what looks like obvious anxiety reactions to almost any physical discomfort  especially anything involving her breathing.  Nurses describe how she continues to try to puff through her mouth even though she cognitively knows that she is no longer breathing through her mouth.  Nursing reports that it has been observed throughout the hospital stay that the patient gets almost paralyzed with fear from respiratory distress which has negatively impacted their ability to get her extubated.  Patient is awake but very fatigued.  She tries to mouth some words only some of which I can understand.  She does shake her head and nod to a few questions.  Patient denies having any suicidal thoughts.  She nods yes that she continues to get anxious frequently.  Patient is currently being given Xanax 0.5 mg 3 times a day by her NG tube that also Lexapro which was started during this hospitalization.  A couple of antipsychotics were started during the hospitalization and I presume for agitation.  Social history: Patient has a husband and adult children involved in the care.  Notes indicate that they have been involved in making decisions about the patient's treatment and that the most recent decision has been for aggressive care rather than hospice level care.  Medical history: Long history of bad COPD which is the primary thing that seems to have brought her down this time.  Now on a tracheostomy.  Has also had infections in the hospital.  Substance abuse history: Patient has a history of cocaine abuse with multiple admissions to the hospital cocaine positive including this 1.  Past Psychiatric History: No known psychiatric hospitalizations no history of suicide attempts no psychiatric notes that I can find in the  chart.  Patient was being treated with long-standing Xanax as well as Remeron before coming into the hospital.  Risk to Self:   Risk to Others:   Prior Inpatient Therapy:   Prior Outpatient Therapy:    Past Medical History:  Past Medical History:  Diagnosis Date  . Allergy   .  Anxiety   . Asthma   . CHF (congestive heart failure) ([place])   . Cocaine abuse ([place])   . COPD (chronic obstructive pulmonary disease) ([place])   . Coronary artery disease   . Emphysema/COPD ([place])   . Hepatitis C   . High cholesterol   . Hypertension   . Pneumothorax   . Tobacco abuse     Past Surgical History:  Procedure Laterality Date  . CARDIAC CATHETERIZATION    . CHEST TUBE INSERTION    . TRACHEOSTOMY TUBE PLACEMENT N/A 10/17/2017   Procedure: TRACHEOSTOMY;  Surgeon: Carloyn Manner, MD;  Location: [facility] ORS;  Service: ENT;  Laterality: N/A;   Family History:  Family History  Problem Relation Age of Onset  . Lung cancer Mother   . Asthma Mother   . CVA Father   . CAD Father   . Stroke Father   . Breast cancer Maternal Aunt 67  . COPD Sister    Family Psychiatric  History: Unknown Social History:  Social History   Substance and Sexual Activity  Alcohol Use No  . Frequency: Never     Social History   Substance and Sexual Activity  Drug Use Yes  . Types: "Crack" cocaine   Comment: Past cocaine use.  Quit 04/2017    Social History   Socioeconomic History  . Marital status: Single    Spouse name: Not on file  . Number of children: Not on file  . Years of education: Not on file  . Highest education level: Not on file  Occupational History  . Not on file  Social Needs  . Financial resource strain: Somewhat hard  . Food insecurity:    Worry: Sometimes true    Inability: Sometimes true  . Transportation needs:    Medical: Yes    Non-medical: Yes  Tobacco Use  . Smoking status: Current Every Day Smoker    Packs/day: 0.10    Years: 41.00    Pack years: 4.10    Types: Cigarettes  . Smokeless tobacco: Never Used  Substance and Sexual Activity  . Alcohol use: No    Frequency: Never  . Drug use: Yes    Types: "Crack" cocaine    Comment: Past cocaine use.  Quit 04/2017  . Sexual activity: Not Currently    Partners: Male    Birth control/protection:  Post-menopausal  Lifestyle  . Physical activity:    Days per week: 0 days    Minutes per session: 0 min  . Stress: To some extent  Relationships  . Social connections:    Talks on phone: Patient refused    Gets together: Patient refused    Attends religious service: Patient refused    Active member of club or organization: Patient refused    Attends meetings of clubs or organizations: Patient refused    Relationship status: Patient refused  Other Topics Concern  . Not on file  Social History Narrative  . Not on file   Additional Social History:    Allergies:   Allergies  Allergen Reactions  . Carvedilol Other (See Comments)    Pt gets "sick" when on medication    Labs:  Results for orders placed or performed during the hospital encounter of 09/30/17 (from the past 48 hour(s))  Glucose, capillary     Status: Abnormal   Collection Time: 10/27/17  7:28 PM  Result Value Ref Range   Glucose-Capillary 148 (H) 65 - 99 mg/dL   Comment 1 Notify RN   Glucose, capillary     Status: Abnormal   Collection Time: 10/27/17 11:16 PM  Result Value Ref Range   Glucose-Capillary 135 (H) 65 - 99 mg/dL   Comment 1 Notify RN   Glucose, capillary     Status: Abnormal   Collection Time: 10/28/17  3:46 AM  Result Value Ref Range   Glucose-Capillary 142 (H) 65 - 99 mg/dL   Comment 1 Notify RN   Glucose, capillary     Status: Abnormal   Collection Time: 10/28/17  7:53 AM  Result Value Ref Range   Glucose-Capillary 135 (H) 65 - 99 mg/dL  Glucose, capillary     Status: Abnormal   Collection Time: 10/28/17 12:00 PM  Result Value Ref Range   Glucose-Capillary 145 (H) 65 - 99 mg/dL  Glucose, capillary     Status: Abnormal   Collection Time: 10/28/17 12:21 PM  Result Value Ref Range   Glucose-Capillary 148 (H) 65 - 99 mg/dL  Glucose, capillary     Status: Abnormal   Collection Time: 10/28/17  4:25 PM  Result Value Ref Range   Glucose-Capillary 158 (H) 65 - 99 mg/dL  Glucose, capillary      Status: Abnormal   Collection Time: 10/28/17  7:13 PM  Result Value Ref Range   Glucose-Capillary 141 (H) 65 - 99 mg/dL  Glucose, capillary     Status: Abnormal   Collection Time: 10/28/17 11:44 PM  Result Value Ref Range   Glucose-Capillary 139 (H) 65 - 99 mg/dL  Glucose, capillary     Status: Abnormal   Collection Time: 10/29/17  3:56 AM  Result Value Ref Range   Glucose-Capillary 137 (H) 65 - 99 mg/dL  Comprehensive metabolic panel     Status: Abnormal   Collection Time: 10/29/17  4:08 AM  Result Value Ref Range   Sodium 137 135 - 145 mmol/L   Potassium 3.8 3.5 - 5.1 mmol/L   Chloride 102 101 - 111 mmol/L   CO2 30 22 - 32 mmol/L   Glucose, Bld 153 (H) 65 - 99 mg/dL   BUN 25 (H) 6 - 20 mg/dL   Creatinine, Ser <0.30 (L) 0.44 - 1.00 mg/dL   Calcium 8.8 (L) 8.9 - 10.3 mg/dL   Total Protein 5.0 (L) 6.5 - 8.1 g/dL   Albumin 2.3 (L) 3.5 - 5.0 g/dL   AST 17 15 - 41 U/L   ALT 40 14 - 54 U/L   Alkaline Phosphatase 69 38 - 126 U/L   Total Bilirubin 0.4 0.3 - 1.2 mg/dL   GFR calc non Af Amer NOT CALCULATED >60 mL/min   GFR calc Af Amer NOT CALCULATED >60 mL/min    Comment: (NOTE) The eGFR has been calculated using the CKD EPI equation. This calculation has not been validated in all clinical situations. eGFR's persistently <60 mL/min signify possible Chronic Kidney Disease.    Anion gap 5 5 - 15    Comment: Performed at [facility], [address] [place], [place] [address]  CBC with Differential/Platelet     Status: Abnormal   Collection Time: 10/29/17  4:08 AM  Result Value Ref Range   WBC 6.8 3.6 -  11.0 K/uL   RBC 2.58 (L) 3.80 - 5.20 MIL/uL   Hemoglobin 7.6 (L) 12.0 - 16.0 g/dL   HCT 23.5 (L) 35.0 - 47.0 %   MCV 91.0 80.0 - 100.0 fL   MCH 29.5 26.0 - 34.0 pg   MCHC 32.4 32.0 - 36.0 g/dL   RDW 17.0 (H) 11.5 - 14.5 %   Platelets 193 150 - 440 K/uL   Neutrophils Relative % 92 %   Neutro Abs 6.3 1.4 - 6.5 K/uL   Lymphocytes Relative 5 %   Lymphs Abs 0.3 (L) 1.0  - 3.6 K/uL   Monocytes Relative 3 %   Monocytes Absolute 0.2 0.2 - 0.9 K/uL   Eosinophils Relative 0 %   Eosinophils Absolute 0.0 0 - 0.7 K/uL   Basophils Relative 0 %   Basophils Absolute 0.0 0 - 0.1 K/uL    Comment: Performed at [facility], [address] [place], [place] [address]  Glucose, capillary     Status: Abnormal   Collection Time: 10/29/17  8:05 AM  Result Value Ref Range   Glucose-Capillary 133 (H) 65 - 99 mg/dL  Glucose, capillary     Status: Abnormal   Collection Time: 10/29/17 11:46 AM  Result Value Ref Range   Glucose-Capillary 136 (H) 65 - 99 mg/dL    Current Facility-Administered Medications  Medication Dose Route Frequency Provider Last Rate Last Dose  . acetaminophen (TYLENOL) solution 650 mg  650 mg Per Tube Q6H PRN Cassandria Santee, MD   650 mg at 10/23/17 2340  . albuterol (PROVENTIL) (2.5 MG/3ML) 0.083% nebulizer solution 2.5 mg  2.5 mg Nebulization Q3H PRN Wilhelmina Mcardle, MD   2.5 mg at 10/19/17 0427  . ALPRAZolam Duanne Moron) tablet 0.25 mg  0.25 mg Oral TID PRN Flora Lipps, MD   0.25 mg at 10/28/17 1437  . aspirin chewable tablet 81 mg  81 mg Oral Daily Gladstone Lighter, MD   81 mg at 10/29/17 0912  . bisacodyl (DULCOLAX) suppository 10 mg  10 mg Rectal Daily PRN Awilda Bill, NP   10 mg at 10/09/17 0228  . budesonide (PULMICORT) nebulizer solution 0.5 mg  0.5 mg Nebulization BID Cassandria Santee, MD   0.5 mg at 10/29/17 0738  . chlorhexidine gluconate (MEDLINE KIT) (PERIDEX) 0.12 % solution 15 mL  15 mL Mouth Rinse BID Awilda Bill, NP   15 mL at 10/29/17 0740  . clonazepam (KLONOPIN) disintegrating tablet 0.5 mg  0.5 mg Oral TID Casi Westerfeld T, MD      . cloNIDine (CATAPRES - Dosed in mg/24 hr) patch 0.3 mg  0.3 mg Transdermal Weekly Wilhelmina Mcardle, MD   0.3 mg at 10/29/17 1300  . famotidine (PEPCID) tablet 20 mg  20 mg Per Tube Daily Wilhelmina Mcardle, MD   20 mg at 10/29/17 0912  . feeding supplement (VITAL AF 1.2 CAL) liquid 1,000 mL   1,000 mL Per Tube Continuous Wilhelmina Mcardle, MD 45 mL/hr at 10/29/17 0600    . fentaNYL (DURAGESIC - dosed mcg/hr) 75 mcg  75 mcg Transdermal Q72H Wilhelmina Mcardle, MD   75 mcg at 10/29/17 1200  . free water 100 mL  100 mL Per Tube Q8H Wilhelmina Mcardle, MD   100 mL at 10/29/17 1428  . glycopyrrolate (ROBINUL) injection 0.2 mg  0.2 mg Intravenous TID PRN Tukov-Yual, Magdalene S, NP   0.2 mg at 10/28/17 0911  . heparin injection 5,000 Units  5,000 Units Subcutaneous Q8H Vachhani,  Rosalio Macadamia, MD   5,000 Units at 10/29/17 1428  . hydrALAZINE (APRESOLINE) injection 10-40 mg  10-40 mg Intravenous Q4H PRN Wilhelmina Mcardle, MD   20 mg at 10/17/17 1455  . insulin aspart (novoLOG) injection 0-9 Units  0-9 Units Subcutaneous Q4H Awilda Bill, NP   1 Units at 10/29/17 1200  . ipratropium-albuterol (DUONEB) 0.5-2.5 (3) MG/3ML nebulizer solution 3 mL  3 mL Nebulization Q6H Wilhelmina Mcardle, MD   3 mL at 10/29/17 1423  . levETIRAcetam (KEPPRA) 500 mg in sodium chloride 0.9 % 100 mL injection   Intravenous Q12H Pyreddy, Reatha Harps, MD 100 mL/hr at 10/28/17 0830    . LORazepam (ATIVAN) injection 2 mg  2 mg Intravenous Q1H PRN Flora Lipps, MD   2 mg at 10/29/17 0809  . MEDLINE mouth rinse  15 mL Mouth Rinse 10 times per day Awilda Bill, NP   15 mL at 10/29/17 1428  . metoprolol tartrate (LOPRESSOR) injection 2.5-5 mg  2.5-5 mg Intravenous Q3H PRN Wilhelmina Mcardle, MD   5 mg at 10/26/17 4098  . midazolam (VERSED) injection 2 mg  2 mg Intravenous Once Samaan, Maged, MD      . midazolam (VERSED) injection 2 mg  2 mg Intravenous Q4H PRN Cassandria Santee, MD   2 mg at 10/29/17 0547  . morphine 2 MG/ML injection 2 mg  2 mg Intravenous Q4H PRN [place] Dragon, MD   2 mg at 10/28/17 1191  . multivitamin liquid 15 mL  15 mL Per Tube Daily Cassandria Santee, MD   15 mL at 10/29/17 0911  . PARoxetine (PAXIL) tablet 10 mg  10 mg Per Tube BID Borna Wessinger T, MD      . QUEtiapine (SEROQUEL) tablet 50 mg  50 mg Per Tube QHS  Flora Lipps, MD   50 mg at 10/28/17 2114  . risperiDONE (RISPERDAL) 1 MG/ML oral solution 2 mg  2 mg Per Tube QHS Wilhelmina Mcardle, MD   2 mg at 10/28/17 2115  . scopolamine (TRANSDERM-SCOP) 1 MG/3DAYS 1.5 mg  1 patch Transdermal Q72H Flora Lipps, MD   1.5 mg at 10/29/17 0913  . senna-docusate (Senokot-S) tablet 1 tablet  1 tablet Per Tube BID Saundra Shelling, MD   1 tablet at 10/29/17 0912  . sodium chloride flush (NS) 0.9 % injection 10-40 mL  10-40 mL Intracatheter PRN Fritzi Mandes, MD   10 mL at 10/03/17 2134  . sodium chloride flush (NS) 0.9 % injection 10-40 mL  10-40 mL Intracatheter Q12H Fritzi Mandes, MD   10 mL at 10/29/17 0912  . sodium chloride flush (NS) 0.9 % injection 10-40 mL  10-40 mL Intracatheter PRN Fritzi Mandes, MD        Musculoskeletal: Strength & Muscle Tone: decreased Gait & Station: unable to stand Patient leans: N/A  Psychiatric Specialty Exam: Physical Exam  Nursing note and vitals reviewed. Constitutional: She appears well-developed and well-nourished.  HENT:  Head: Normocephalic and atraumatic.  Eyes: Pupils are equal, round, and reactive to light. Conjunctivae are normal.  Neck: Normal range of motion.  Cardiovascular: Regular rhythm and normal heart sounds.  Respiratory: Effort normal. No respiratory distress.  GI: Soft.  Musculoskeletal: Normal range of motion.  Neurological: She is alert.  Skin: Skin is warm and dry.  Psychiatric: Her affect is blunt. She is not agitated. She expresses no suicidal ideation. She is noncommunicative.    Review of Systems  Unable to perform ROS: Medical condition    Blood  pressure 110/74, pulse 97, temperature 98.2 F (36.8 C), temperature source Axillary, resp. rate (!) 31, height 4' 9"  (1.448 m), weight 60 kg (132 lb 4.4 oz), last menstrual period 03/06/2005, SpO2 99 %.Body mass index is 28.62 kg/m.  General Appearance: Casual  Eye Contact:  Minimal  Speech:  Negative  Volume:  Decreased  Mood:  Euthymic  Affect:   Blunt  Thought Process:  NA  Orientation:  Other:  Patient appears to be basically oriented to her situation but I did not try to force the issue about date and specific place  Thought Content:  Negative  Suicidal Thoughts:  No  Homicidal Thoughts:  No  Memory:  Immediate;   Negative Recent;   Negative Remote;   Negative  Judgement:  Fair  Insight:  Fair  Psychomotor Activity:  Decreased  Concentration:  Concentration: Fair  Recall:  [employer] of Knowledge:  Fair  Language:  Fair  Akathisia:  No  Handed:  Right  AIMS (if indicated):     Assets:  Desire for Improvement Resilience Social Support  ADL's:  Impaired  Cognition:  Impaired,  Moderate  Sleep:        Treatment Plan Summary: Daily contact with patient to assess and evaluate symptoms and progress in treatment, Medication management and Plan 57 year old woman with long-standing anxiety which continues to be a major impediment to recovery.  Patient is currently not able to give much verbal history which impairs quite a bit how well I understand her situation.  It does look like she was on chronic benzodiazepines before coming into the hospital and that has been replicated since she has been here.  Given that she is still on a ventilator for now there is probably not much of the downside to being aggressive with the benzodiazepines if needed except that we do not want to over sedate her or make her unnecessarily sleepy.  I am going to change the Xanax to 0.5 mg of clonazepam 3 times a day and use the oral dissolving tablet.  One reason is that the clonazepam last longer and should give more steady round-the-clock coverage.  Another is that the oral dissolving tablets can be used even in a patient such as this.  As long as a patient is able to manage their own saliva they will be able to use an oral dissolving tablet and this may improve the absorption.  I have changed the antidepressant from Lexapro to Paxil which probably is a  little better indicated for panic attacks.  I will not change the antipsychotics for now which I presume were added for pragmatic reasons.  Spoke with the patient and with nursing making it clear that this is not going to be a miracle treatment for anxiety but we can try and improve it and that ultimately she just needs to work on calming down and making the effort to recover.  I will follow-up as needed.  Disposition: No evidence of imminent risk to self or others at present.   Patient does not meet criteria for psychiatric inpatient admission. Supportive therapy provided about ongoing stressors.  Alethia Berthold, MD 10/29/2017 5:26 PM

## 2017-10-29 NOTE — Progress Notes (Addendum)
Daily Progress Note   Patient Name: Michelle [name]       Date: 10/29/2017 DOB: 1961/02/09  Age: 57 y.o. MRN#: [id number] Attending Physician: Loletha Grayer, MD Primary Care Physician: [facility], [place] Date: 09/30/2017  Reason for Consultation/Follow-up: Establishing goals of care  Subjective: Patient sitting in bed on ventilator. She appears to be slightly dyspneic. Both daughter, sister, and significant other at bedside. Daughter [name] who is present today is an LPN at a SNF.   Pieter Partridge indicated yesterday he and Ms. [place] were married, but she kept her last name. Today he states they were never legally married, and have been together for over 35 years. Pieter Partridge is father of the daughters.  [place] SW and representative for [facility] (SNF with ventilator ability) present for [facility] meeting. Family understands the seriousness of her status. Discussion that there is a chance that the ventilator may be long-term. Discussion of her quality of life prior to this hospitalization and best and worst case scenario moving forward. We discussed quality of life and rehab needed if she is weaned from the ventilator.   Family given time to speak privately and consider options, and has decided to proceed with aggressive care including PEG tube. Upon discussing any time frames they would have, they initially state that if she declines to make her comfortable, but upon further discussion, they state that this means they do not want chest compressions, shocks, or ACLS medications if her heart stops, as previously decided yesterday.   SW aware of plan. Rep from facility to continue conversation for transfer.       Length of Stay: 29  Current Medications: Scheduled Meds:  . ALPRAZolam   0.5 mg Per Tube TID  . aspirin  81 mg Oral Daily  . budesonide (PULMICORT) nebulizer solution  0.5 mg Nebulization BID  . chlorhexidine gluconate (MEDLINE KIT)  15 mL Mouth Rinse BID  . cloNIDine  0.3 mg Transdermal Weekly  . escitalopram  10 mg Per Tube Daily  . famotidine  20 mg Per Tube Daily  . fentaNYL  75 mcg Transdermal Q72H  . free water  100 mL Per Tube Q8H  . heparin  5,000 Units Subcutaneous Q8H  . insulin aspart  0-9 Units Subcutaneous Q4H  . ipratropium-albuterol  3 mL Nebulization  Q6H  . mouth rinse  15 mL Mouth Rinse 10 times per day  . [START ON 10/30/2017] methylPREDNISolone (SOLU-MEDROL) injection  40 mg Intravenous Daily  . midazolam  2 mg Intravenous Once  . multivitamin  15 mL Per Tube Daily  . QUEtiapine  50 mg Per Tube QHS  . risperiDONE  2 mg Per Tube QHS  . scopolamine  1 patch Transdermal Q72H  . senna-docusate  1 tablet Per Tube BID  . sodium chloride flush  10-40 mL Intracatheter Q12H    Continuous Infusions: . feeding supplement (VITAL AF 1.2 CAL) 45 mL/hr at 10/29/17 0600  . small volume/piggyback builder 100 mL/hr at 10/28/17 0830    PRN Meds: acetaminophen (TYLENOL) oral liquid 160 mg/5 mL, albuterol, ALPRAZolam, bisacodyl, glycopyrrolate, hydrALAZINE, LORazepam, metoprolol tartrate, midazolam, morphine injection, sodium chloride flush, sodium chloride flush  Physical Exam  Constitutional: She appears distressed.  Pulmonary/Chest:  Trach            Vital Signs: BP 114/79   Pulse (!) 120   Temp 98.2 F (36.8 C) (Axillary)   Resp (!) 27   Ht _0  (1.448 m)   Wt 60 kg (132 lb 4.4 oz)   LMP 03/06/2005 (Approximate)   SpO2 92%   BMI 28.62 kg/m  SpO2: SpO2: 92 % O2 Device: O2 Device: Ventilator O2 Flow Rate:    Intake/output summary:   Intake/Output Summary (Last 24 hours) at 10/29/2017 1400 Last data filed at 10/29/2017 0800 Gross per 24 hour  Intake 1270 ml  Output 650 ml  Net 620 ml   LBM: Last BM Date: 10/28/17 Baseline  Weight: Weight: 45.7 kg (100 lb 12.8 oz) Most recent weight: Weight: 60 kg (132 lb 4.4 oz)       Palliative Assessment/Data: Lurline Idol      Patient Active Problem List   Diagnosis Date Noted  . Altered mental status   . Pressure injury of skin 10/23/2017  . Acute on chronic respiratory failure ([place]) 09/30/2017  . Acute respiratory failure ([place]) 08/20/2017  . Acute on chronic respiratory failure with hypoxia and hypercapnia ([place]) 06/29/2017  . Acute on chronic respiratory failure with hypoxia ([place]) 02/23/2017  . Protein-calorie malnutrition, severe 05/23/2016  . Sepsis ([place]) 05/22/2016  . Malnutrition of moderate degree ([place]) 10/25/2014  . COPD exacerbation ([place]) 10/24/2014  . Tobacco abuse 10/24/2014    Palliative Care Assessment & Plan   Patient Profile:  [name] a57 y.o.femalewith a known history of anxiety, CHF, cocaine abuse, COPD, coronary artery disease, hepatitis C, hypertension- as severely short of breath at home and so called EMS. On the way EMT gave her 5-6 breathing treatments but she was still extremely short of breath in the emergency room on arrival, so ER physician intubated her.   Assessment/Recommendations/Plan:  Plan for aggressive care and PEG. Plan for no chest compressions, shocks, or ACLS medications if her heart stops.   Recommend palliative to continue [place].   Daughters are decision makers, significant other for over 58 years, not married.   Code Status:    Code Status Orders  (From admission, onward)        Start     Ordered   10/28/17 1707  Limited resuscitation (code)  Continuous    Question Answer Comment  In the event of cardiac or respiratory ARREST: Initiate Code Blue, Call Rapid Response No   In the event of cardiac or respiratory ARREST: Perform CPR No   In the event of cardiac or respiratory ARREST: Perform  Intubation/Mechanical Ventilation Yes   In the event of cardiac or respiratory ARREST: Use NIPPV/BiPAp only if  indicated No   In the event of cardiac or respiratory ARREST: Administer ACLS medications if indicated No   In the event of cardiac or respiratory ARREST: Perform Defibrillation or Cardioversion if indicated No   Comments Continue trach and ventilator. No resusitation if heart stops.      10/28/17 1711    Code Status History    Date Active Date Inactive Code Status Order ID Comments User Context   09/30/2017 1945 10/28/2017 1711 Full Code [id number]  [name], MD Inpatient   08/20/2017 1713 08/24/2017 1743 Full Code [id number]  Dustin Flock, MD Inpatient   06/29/2017 0625 07/02/2017 2008 Full Code [id number]  Harrie Foreman, MD Inpatient   02/23/2017 0241 02/26/2017 1634 Full Code [id number]  Harrie Foreman, MD Inpatient   01/01/2017 1144 01/04/2017 1354 Full Code [id number]  Bettey Costa, MD Inpatient   05/22/2016 2155 05/27/2016 1645 Full Code [id number]  Fritzi Mandes, MD Inpatient   04/23/2016 2020 04/27/2016 1848 Full Code [id number]  Hower, Aaron Mose, MD ED   02/07/2016 1922 02/10/2016 1835 Full Code [id number]  Gladstone Lighter, MD Inpatient   11/29/2015 0519 12/01/2015 1609 Full Code [id number]  Harrie Foreman, MD Inpatient   11/05/2015 0911 11/07/2015 1606 Full Code [id number]  Hillary Bow, MD ED   10/24/2014 1522 10/27/2014 1630 Full Code [id number]  Hillary Bow, MD Inpatient       Prognosis:  Poor long term. Trach, plan for PEG.   Discharge Planning:  SNF/vent Facility   Thank you for allowing the Palliative Medicine Team to assist in the care of this patient.   Time In: 1:00 Time Out: 3:00 Total Time 2 hours Prolonged Time Billed  yes       Greater than 50%  of this time was spent counseling and coordinating care related to the above assessment and plan.  Asencion Gowda, NP  Please contact Palliative Medicine Team phone at [phone] for questions and concerns.

## 2017-10-29 NOTE — Clinical Social Work Note (Signed)
Sonia Baller at [facility] has stated that the husband has not been reachable and that the daughter, Josephine Igo, told her she would get her father to call but now Sonia Baller can not reach the daughter. Patient's nurse informed CSW this morning that patient's family is to meet with Palliative Care around 1pm today to decided upon potentially comfort care. Nursing aware to update CSW when able. Shela Leff MSW,LCSW [phone]

## 2017-10-30 ENCOUNTER — Inpatient Hospital Stay: Payer: Medicaid Other

## 2017-10-30 ENCOUNTER — Other Ambulatory Visit: Payer: Self-pay

## 2017-10-30 DIAGNOSIS — F410 Panic disorder [episodic paroxysmal anxiety] without agoraphobia: Secondary | ICD-10-CM

## 2017-10-30 LAB — CBC WITH DIFFERENTIAL/PLATELET
BASOS ABS: 0 10*3/uL (ref 0–0.1)
BASOS PCT: 0 %
Eosinophils Absolute: 0 10*3/uL (ref 0–0.7)
Eosinophils Relative: 0 %
HEMATOCRIT: 25.1 % — AB (ref 35.0–47.0)
HEMOGLOBIN: 8.2 g/dL — AB (ref 12.0–16.0)
LYMPHS PCT: 7 %
Lymphs Abs: 0.5 10*3/uL — ABNORMAL LOW (ref 1.0–3.6)
MCH: 29.4 pg (ref 26.0–34.0)
MCHC: 32.5 g/dL (ref 32.0–36.0)
MCV: 90.5 fL (ref 80.0–100.0)
MONOS PCT: 5 %
Monocytes Absolute: 0.3 10*3/uL (ref 0.2–0.9)
NEUTROS ABS: 5.8 10*3/uL (ref 1.4–6.5)
Neutrophils Relative %: 88 %
Platelets: 234 10*3/uL (ref 150–440)
RBC: 2.78 MIL/uL — ABNORMAL LOW (ref 3.80–5.20)
RDW: 16.9 % — ABNORMAL HIGH (ref 11.5–14.5)
WBC: 6.6 10*3/uL (ref 3.6–11.0)

## 2017-10-30 LAB — GLUCOSE, CAPILLARY
GLUCOSE-CAPILLARY: 109 mg/dL — AB (ref 65–99)
GLUCOSE-CAPILLARY: 110 mg/dL — AB (ref 65–99)
GLUCOSE-CAPILLARY: 112 mg/dL — AB (ref 65–99)
GLUCOSE-CAPILLARY: 132 mg/dL — AB (ref 65–99)
Glucose-Capillary: 117 mg/dL — ABNORMAL HIGH (ref 65–99)
Glucose-Capillary: 117 mg/dL — ABNORMAL HIGH (ref 65–99)

## 2017-10-30 LAB — HEMOGLOBIN AND HEMATOCRIT, BLOOD
HCT: 24.8 % — ABNORMAL LOW (ref 35.0–47.0)
Hemoglobin: 8 g/dL — ABNORMAL LOW (ref 12.0–16.0)

## 2017-10-30 MED ORDER — JUVEN PO PACK
1.0000 | PACK | Freq: Two times a day (BID) | ORAL | Status: DC
  Administered 2017-11-01 – 2017-11-17 (×32): 1

## 2017-10-30 MED ORDER — SODIUM CHLORIDE 0.9 % IV SOLN
INTRAVENOUS | Status: DC
  Administered 2017-10-31: 11:00:00 via INTRAVENOUS

## 2017-10-30 NOTE — Progress Notes (Signed)
Physical Therapy Treatment Patient Details Name: Michelle [name] MRN: [id number] DOB: June 24, 1960 Today's Date: 10/30/2017    History of Present Illness Pt is a 57 y/o F who presented with severe SOB with COPD exacerbation.  Chest x-ray was clear, patient was slightly hypotensive after intubation, admitted to the ICU.  Trach tube placed 5/16.  Pt demonstrated seizure like activity on 5/23, MRI negative, EEG abnormal but no epileptiform activity.  Pt's PMH includes cocaine abuse, COPD, CAD, hepatitis C, CHF.      PT Comments    Discussion with nurse prior to treatment regarding pt response/weakness. Nurse agreeable to attempt treatment. Pt does answer some initial yes/no questions with head nods, noting pain in BLEs and is agreeable to exercises. Pt demonstrates no active participation in [place]; treatment all PROM. Continue to attempt greater participation, active assistive exercise to progress mobility.    Follow Up Recommendations  SNF     Equipment Recommendations       Recommendations for Other Services       Precautions / Restrictions Precautions Precautions: Fall;Other (comment) Restrictions Weight Bearing Restrictions: No    Mobility  Bed Mobility                  Transfers                    Ambulation/Gait                 Stairs             Wheelchair Mobility    Modified Rankin (Stroke Patients Only)       Balance                                            Cognition Arousal/Alertness: Lethargic Behavior During Therapy: WFL for tasks assessed/performed;Flat affect                                   General Comments: Pt responds little after initial questions. Very lethargic/falling asleep      Exercises Other Exercises Other Exercises: BLE PROM at ankle, knee, hip x 10 min    General Comments        Pertinent Vitals/Pain Pain Assessment: (nods head yes to pain in legs)    Home Living  Family/patient expects to be discharged to:: Skilled nursing facility Living Arrangements: Spouse/significant other                  Prior Function            PT Goals (current goals can now be found in the care plan section) Progress towards PT goals: Not progressing toward goals - comment    Frequency    Min 2X/week      PT Plan Current plan remains appropriate    Co-evaluation              AM-PAC PT "6 Clicks" Daily Activity  Outcome Measure  Difficulty turning over in bed (including adjusting bedclothes, sheets and blankets)?: Unable Difficulty moving from lying on back to sitting on the side of the bed? : Unable Difficulty sitting down on and standing up from a chair with arms (e.g., wheelchair, bedside commode, etc,.)?: Unable Help needed moving to and from a bed to chair (including a wheelchair)?:  Total Help needed walking in hospital room?: Total Help needed climbing 3-5 steps with a railing? : Total 6 Click Score: 6    End of Session   Activity Tolerance: Patient limited by lethargy Patient left: in bed;with call bell/phone within reach;with bed alarm set Nurse Communication: Other (comment)(pt response/weakness prior to session) PT Visit Diagnosis: Muscle weakness (generalized) (M62.81);Difficulty in walking, not elsewhere classified (R26.2)     Time: 2956-2130 PT Time Calculation (min) (ACUTE ONLY): 10 min  Charges:  $Therapeutic Exercise: 8-22 mins                    G Codes:        Larae Grooms, PTA 10/30/2017, 12:45 PM

## 2017-10-30 NOTE — Progress Notes (Addendum)
Nutrition Follow-up  DOCUMENTATION CODES:   Non-severe (moderate) malnutrition in context of chronic illness  INTERVENTION:  Continue Vital AF 1.2 at 45 mL/hr (1080 mL goal daily volume) via NGT. Provides 1296 kcal, 81 grams of protein, 875 mL H2O daily.  Continue liquid MVI daily per tube.  With free water flush of 100 mL Q8hrs patient is receiving a total of 1175 mL H2O daily including water in tube feeding.  Provide Juven BID per tube, each supplement provides 80 kcal, 14 grams of amino acids, and vitamins/minerals essential for wound healing.  NUTRITION DIAGNOSIS:   Moderate Malnutrition related to chronic illness(COPD, CHF, hepatitis C, cocaine abuse) as evidenced by mild fat depletion, moderate fat depletion, mild muscle depletion, moderate muscle depletion.  Ongoing - addressing with TF regimen.   GOAL:   Provide needs based on ASPEN/SCCM guidelines  Met with TF regimen.  MONITOR:   Vent status, Labs, Weight trends, TF tolerance, I & O's  REASON FOR ASSESSMENT:   Ventilator    ASSESSMENT:   57 year old female with PMHx of emphysema, COPD, HTN, hypercholesterolemia, hx pneumothorax s/p chest tube insertion, CAD s/p cardiac catheterization, cocaine abuse, tobacco abuse, asthma, CHF, anxiety, hepatitis C admitted with acute exacerbation of COPD, acute on chronic respiratory failure requiring intubation 4/29, PNA, cocaine intoxication, AKI due to renal hypoperfusion.  -Patient is s/p tracheostomy tube placement on 5/16. OGT was replaced with NGT. -Medications were adjusted by psychiatry on 5/28 to try to help with patient's anxiety. -Patient scheduled for placement of G-tube by IR tomorrow.  Patient remains on mechanical ventilation per tracheostomy tube. Having anxiety attacks. Patient was in [facility] mode at time of RD assessment this AM. After discussing [place] on 5/28, family decided to move forward with placement of G-tube. Abdomen feels soft today. Discharge planning  for vent/SNF once G-tube has been placed and paperwork/medicaid application has been finalized.  Access: 16 Fr. NGT placed 5/16; terminates in mid-stomach per abdominal x-ray 5/16; 65 cm at right nare  TF: pt continues to tolerate Vital AF 1.2 at 45 mL/hr  Patient is currently intubated on ventilator support Ve: 6.9 L/min Temp (24hrs), Avg:98.7 F (37.1 C), Min:98.1 F (36.7 C), Max:99.2 F (37.3 C)  Propofol: N/A  Medications reviewed and include: clonazepam, clonidine patch weekly, famotidine, fentanyl patch, free water flush 100 mL Q8hrs, Novolog 0-9 units Q4hrs, liquid MVI daily per tube, Seroquel, scopolamine patch, senna-docusate, Keppra.  Labs reviewed: CBG 117-132.  I/O: 975 mL UOP yesterday (0.7 mL/kg/hr)  Weight trend: 55.4 kg on 5/29; +9.7 kg from admission  Discussed with RN and on rounds.  Diet Order:   Diet Order    None      EDUCATION NEEDS:   No education needs have been identified at this time  Skin:  Skin Assessment: Skin Integrity Issues: Skin Integrity Issues:: Stage II, Incisions Stage II: sacrum Incisions: closed incision to neck  Last BM:  10/30/2017 - medium type 4  Height:   Ht Readings from Last 1 Encounters:  10/01/17 _0  (1.448 m)    Weight:   Wt Readings from Last 1 Encounters:  10/30/17 122 lb 2.2 oz (55.4 kg)    Ideal Body Weight:  43.2 kg  BMI:  Body mass index is 26.43 kg/m.  Estimated Nutritional Needs:   Kcal:  1150-1370 (25-30 kcal/kg)  Protein:  70-80 grams (1.5-1.8 grams/kg)  Fluid:  1.4-1.6 L/day (30-35 mL/kg)  Willey Blade, MS, RD, LDN Office: [phone] Pager: [phone] After Hours/Weekend Pager: [phone]

## 2017-10-30 NOTE — Progress Notes (Addendum)
Daily Progress Note   Patient Name: Michelle [name]       Date: 10/30/2017 DOB: Nov 21, 1960  Age: 57 y.o. MRN#: [id number] Attending Physician: Gorden Harms, MD Primary Care Physician: [facility], [place] Date: 09/30/2017  Reason for Consultation/Follow-up: Establishing goals of care, support.  Subjective: Patient resting in bed on ventilator. She appears comfortable. She awakens to touch and lips the word " Hi". She nods yes that her anxiety is better. She lips the words" a little" when asked if she has pain.   Per nursing, plan for PEG tomorrow. Possible transfer in 24 hours following PEG placement.    Length of Stay: 30  Current Medications: Scheduled Meds:  . aspirin  81 mg Oral Daily  . budesonide (PULMICORT) nebulizer solution  0.5 mg Nebulization BID  . chlorhexidine gluconate (MEDLINE KIT)  15 mL Mouth Rinse BID  . clonazepam  0.5 mg Oral TID  . cloNIDine  0.3 mg Transdermal Weekly  . famotidine  20 mg Per Tube Daily  . fentaNYL  75 mcg Transdermal Q72H  . free water  100 mL Per Tube Q8H  . heparin  5,000 Units Subcutaneous Q8H  . insulin aspart  0-9 Units Subcutaneous Q4H  . ipratropium-albuterol  3 mL Nebulization Q6H  . mouth rinse  15 mL Mouth Rinse 10 times per day  . midazolam  2 mg Intravenous Once  . multivitamin  15 mL Per Tube Daily  . PARoxetine  10 mg Per Tube BID  . QUEtiapine  50 mg Per Tube QHS  . risperiDONE  2 mg Per Tube QHS  . scopolamine  1 patch Transdermal Q72H  . senna-docusate  1 tablet Per Tube BID  . sodium chloride flush  10-40 mL Intracatheter Q12H    Continuous Infusions: . feeding supplement (VITAL AF 1.2 CAL) 45 mL/hr at 10/30/17 1400  . small volume/piggyback builder 100 mL/hr at 10/28/17 0830    PRN  Meds: acetaminophen (TYLENOL) oral liquid 160 mg/5 mL, albuterol, ALPRAZolam, bisacodyl, glycopyrrolate, hydrALAZINE, LORazepam, metoprolol tartrate, midazolam, morphine injection, sodium chloride flush, sodium chloride flush  Physical Exam  Pulmonary/Chest:  Trach  Musculoskeletal:  Edema in arms.   Skin: Skin is warm and dry.            Vital Signs: BP 112/68   Pulse  90   Temp 98.9 F (37.2 C) (Axillary)   Resp 20   Ht 4' 9"  (1.448 m)   Wt 55.4 kg (122 lb 2.2 oz)   LMP 03/06/2005 (Approximate)   SpO2 100%   BMI 26.43 kg/m  SpO2: SpO2: 100 % O2 Device: O2 Device: Ventilator O2 Flow Rate:    Intake/output summary:   Intake/Output Summary (Last 24 hours) at 10/30/2017 1435 Last data filed at 10/30/2017 1400 Gross per 24 hour  Intake 1890 ml  Output 175 ml  Net 1715 ml   LBM: Last BM Date: 10/30/17 Baseline Weight: Weight: 45.7 kg (100 lb 12.8 oz) Most recent weight: Weight: 55.4 kg (122 lb 2.2 oz)       Palliative Assessment/Data: Lurline Idol    Flowsheet Rows     Most Recent Value  Intake Tab  Referral Department  Hospitalist  Unit at Time of Referral  ICU  Palliative Care Primary Diagnosis  Pulmonary  Date Notified  10/24/17  Palliative Care Type  Return patient Palliative Care  Reason for referral  Clarify Goals of Care  Date of Admission  09/30/17  Date first seen by Palliative Care  10/28/17  # of days Palliative referral response time  4 Day(s)  # of days IP prior to Palliative referral  24  Clinical Assessment  Psychosocial & Spiritual Assessment  Palliative Care Outcomes      Patient Active Problem List   Diagnosis Date Noted  . Panic disorder 10/29/2017  . Altered mental status   . Pressure injury of skin 10/23/2017  . Acute on chronic respiratory failure ([place]) 09/30/2017  . Acute respiratory failure ([place]) 08/20/2017  . Acute on chronic respiratory failure with hypoxia and hypercapnia ([place]) 06/29/2017  . Acute on chronic respiratory failure with  hypoxia ([place]) 02/23/2017  . Protein-calorie malnutrition, severe 05/23/2016  . Sepsis ([place]) 05/22/2016  . Malnutrition of moderate degree ([place]) 10/25/2014  . COPD exacerbation ([place]) 10/24/2014  . Tobacco abuse 10/24/2014    Palliative Care Assessment & Plan   Patient Profile:  [name] a57 y.o.femalewith a known history of anxiety, CHF, cocaine abuse, COPD, coronary artery disease, hepatitis C, hypertension- as severely short of breath at home and so called EMS. On the way EMT gave her 5-6 breathing treatments but she was still extremely short of breath in the emergency room on arrival, so ER physician intubated her.   Assessment/Recommendations/Plan:  Plan for aggressive care and PEG. Plan for no chest compressions, shocks, or ACLS medications if her heart stops.   Recommend palliative to continue [place].   Daughters are Garment/textile technologist, she has a significant other for over 62 years, not married.   Code Status:    Code Status Orders  (From admission, onward)        Start     Ordered   10/28/17 1707  Limited resuscitation (code)  Continuous    Question Answer Comment  In the event of cardiac or respiratory ARREST: Initiate Code Blue, Call Rapid Response No   In the event of cardiac or respiratory ARREST: Perform CPR No   In the event of cardiac or respiratory ARREST: Perform Intubation/Mechanical Ventilation Yes   In the event of cardiac or respiratory ARREST: Use NIPPV/BiPAp only if indicated No   In the event of cardiac or respiratory ARREST: Administer ACLS medications if indicated No   In the event of cardiac or respiratory ARREST: Perform Defibrillation or Cardioversion if indicated No   Comments Continue trach and ventilator. No resusitation if  heart stops.      10/28/17 1711    Code Status History    Date Active Date Inactive Code Status Order ID Comments User Context   09/30/2017 1945 10/28/2017 1711 Full Code [id number]  [name], MD Inpatient    08/20/2017 1713 08/24/2017 1743 Full Code [id number]  Dustin Flock, MD Inpatient   06/29/2017 0625 07/02/2017 2008 Full Code [id number]  Harrie Foreman, MD Inpatient   02/23/2017 0241 02/26/2017 1634 Full Code [id number]  Harrie Foreman, MD Inpatient   01/01/2017 1144 01/04/2017 1354 Full Code [id number]  Bettey Costa, MD Inpatient   05/22/2016 2155 05/27/2016 1645 Full Code [id number]  Fritzi Mandes, MD Inpatient   04/23/2016 2020 04/27/2016 1848 Full Code [id number]  Hower, Aaron Mose, MD ED   02/07/2016 1922 02/10/2016 1835 Full Code [id number]  Gladstone Lighter, MD Inpatient   11/29/2015 0519 12/01/2015 1609 Full Code [id number]  Harrie Foreman, MD Inpatient   11/05/2015 0911 11/07/2015 1606 Full Code [id number]  Hillary Bow, MD ED   10/24/2014 1522 10/27/2014 1630 Full Code [id number]  Hillary Bow, MD Inpatient       Prognosis:  Poor long term. Trach, plan for PEG.   Discharge Planning:  SNF/vent Facility   Thank you for allowing the Palliative Medicine Team to assist in the care of this patient.   Total Time 15 min Prolonged Time Billed  no      Greater than 50%  of this time was spent counseling and coordinating care related to the above assessment and plan.  Asencion Gowda, NP  Please contact Palliative Medicine Team phone at [phone] for questions and concerns.

## 2017-10-30 NOTE — Clinical Social Work Note (Addendum)
CSW consulted for LTAC placement. Patient is not eligible for LTAC placement due to having only medicaid. CSW already involved and has secured a vent/snf bed but a [facility] but [place] [facility] has to Haematologist. Shela Leff MSW,LCSW [phone]

## 2017-10-30 NOTE — Consult Note (Signed)
[facility] Consult consult follow-up 57 year old woman with anxiety disorder who is in the hospital with  Reason for Consult: Pulmonary problems and difficulty getting off the ventilator. Referring Physician: Salary Patient Identification: Michelle [name] MRN:  [id number] Principal Diagnosis: Panic disorder Diagnosis:   Patient Active Problem List   Diagnosis Date Noted  . Panic disorder [F41.0] 10/29/2017  . Altered mental status [R41.82]   . Pressure injury of skin [L89.90] 10/23/2017  . Acute on chronic respiratory failure ([place]) [J96.20] 09/30/2017  . Acute respiratory failure ([place]) [J96.00] 08/20/2017  . Acute on chronic respiratory failure with hypoxia and hypercapnia (HCC) [E31.54, J96.22] 06/29/2017  . Acute on chronic respiratory failure with hypoxia ([place]) [J96.21] 02/23/2017  . Protein-calorie malnutrition, severe [E43] 05/23/2016  . Sepsis ([place]) [A41.9] 05/22/2016  . Malnutrition of moderate degree ([place]) [E44.0] 10/25/2014  . COPD exacerbation ([place]) [J44.1] 10/24/2014  . Tobacco abuse [Z72.0] 10/24/2014    Total Time spent with patient: 30 minutes  Subjective:   Michelle [name] is a 57 y.o. female patient admitted with patient not able to give information but she was admitted to the hospital with respiratory failure.  HPI: See previous notes.  Patient with chronic anxiety which was contributing to difficulty getting off the ventilator.  Patient seems to have improved a little bit today.  Certainly no worse.  She indicates that her nerves feel a little bit better.  Vital signs seem stable.  She is receiving the medications as prescribed.  Past Psychiatric History: Chronic anxiety and panic attacks.  Cocaine abuse.  Risk to Self:   Risk to Others:   Prior Inpatient Therapy:   Prior Outpatient Therapy:    Past Medical History:  Past Medical History:  Diagnosis Date  . Allergy   . Anxiety   . Asthma   . CHF (congestive heart failure) ([place])   . Cocaine  abuse ([place])   . COPD (chronic obstructive pulmonary disease) ([place])   . Coronary artery disease   . Emphysema/COPD ([place])   . Hepatitis C   . High cholesterol   . Hypertension   . Pneumothorax   . Tobacco abuse     Past Surgical History:  Procedure Laterality Date  . CARDIAC CATHETERIZATION    . CHEST TUBE INSERTION    . TRACHEOSTOMY TUBE PLACEMENT N/A 10/17/2017   Procedure: TRACHEOSTOMY;  Surgeon: Carloyn Manner, MD;  Location: [facility] ORS;  Service: ENT;  Laterality: N/A;   Family History:  Family History  Problem Relation Age of Onset  . Lung cancer Mother   . Asthma Mother   . CVA Father   . CAD Father   . Stroke Father   . Breast cancer Maternal Aunt 67  . COPD Sister    Family Psychiatric  History: None Social History:  Social History   Substance and Sexual Activity  Alcohol Use No  . Frequency: Never     Social History   Substance and Sexual Activity  Drug Use Yes  . Types: "Crack" cocaine   Comment: Past cocaine use.  Quit 04/2017    Social History   Socioeconomic History  . Marital status: Single    Spouse name: Not on file  . Number of children: Not on file  . Years of education: Not on file  . Highest education level: Not on file  Occupational History  . Not on file  Social Needs  . Financial resource strain: Somewhat hard  . Food insecurity:    Worry: Sometimes true  Inability: Sometimes true  . Transportation needs:    Medical: Yes    Non-medical: Yes  Tobacco Use  . Smoking status: Current Every Day Smoker    Packs/day: 0.10    Years: 41.00    Pack years: 4.10    Types: Cigarettes  . Smokeless tobacco: Never Used  Substance and Sexual Activity  . Alcohol use: No    Frequency: Never  . Drug use: Yes    Types: "Crack" cocaine    Comment: Past cocaine use.  Quit 04/2017  . Sexual activity: Not Currently    Partners: Male    Birth control/protection: Post-menopausal  Lifestyle  . Physical activity:    Days per week: 0 days     Minutes per session: 0 min  . Stress: To some extent  Relationships  . Social connections:    Talks on phone: Patient refused    Gets together: Patient refused    Attends religious service: Patient refused    Active member of club or organization: Patient refused    Attends meetings of clubs or organizations: Patient refused    Relationship status: Patient refused  Other Topics Concern  . Not on file  Social History Narrative  . Not on file   Additional Social History:    Allergies:   Allergies  Allergen Reactions  . Carvedilol Other (See Comments)    Pt gets "sick" when on medication    Labs:  Results for orders placed or performed during the hospital encounter of 09/30/17 (from the past 48 hour(s))  Glucose, capillary     Status: Abnormal   Collection Time: 10/28/17  7:13 PM  Result Value Ref Range   Glucose-Capillary 141 (H) 65 - 99 mg/dL  Glucose, capillary     Status: Abnormal   Collection Time: 10/28/17 11:44 PM  Result Value Ref Range   Glucose-Capillary 139 (H) 65 - 99 mg/dL  Glucose, capillary     Status: Abnormal   Collection Time: 10/29/17  3:56 AM  Result Value Ref Range   Glucose-Capillary 137 (H) 65 - 99 mg/dL  Comprehensive metabolic panel     Status: Abnormal   Collection Time: 10/29/17  4:08 AM  Result Value Ref Range   Sodium 137 135 - 145 mmol/L   Potassium 3.8 3.5 - 5.1 mmol/L   Chloride 102 101 - 111 mmol/L   CO2 30 22 - 32 mmol/L   Glucose, Bld 153 (H) 65 - 99 mg/dL   BUN 25 (H) 6 - 20 mg/dL   Creatinine, Ser <0.30 (L) 0.44 - 1.00 mg/dL   Calcium 8.8 (L) 8.9 - 10.3 mg/dL   Total Protein 5.0 (L) 6.5 - 8.1 g/dL   Albumin 2.3 (L) 3.5 - 5.0 g/dL   AST 17 15 - 41 U/L   ALT 40 14 - 54 U/L   Alkaline Phosphatase 69 38 - 126 U/L   Total Bilirubin 0.4 0.3 - 1.2 mg/dL   GFR calc non Af Amer NOT CALCULATED >60 mL/min   GFR calc Af Amer NOT CALCULATED >60 mL/min    Comment: (NOTE) The eGFR has been calculated using the CKD EPI equation. This  calculation has not been validated in all clinical situations. eGFR's persistently <60 mL/min signify possible Chronic Kidney Disease.    Anion gap 5 5 - 15    Comment: Performed at [facility], [address] [place], [place] [address]  CBC with Differential/Platelet     Status: Abnormal   Collection Time: 10/29/17  4:08 AM  Result Value Ref Range   WBC 6.8 3.6 - 11.0 K/uL   RBC 2.58 (L) 3.80 - 5.20 MIL/uL   Hemoglobin 7.6 (L) 12.0 - 16.0 g/dL   HCT 23.5 (L) 35.0 - 47.0 %   MCV 91.0 80.0 - 100.0 fL   MCH 29.5 26.0 - 34.0 pg   MCHC 32.4 32.0 - 36.0 g/dL   RDW 17.0 (H) 11.5 - 14.5 %   Platelets 193 150 - 440 K/uL   Neutrophils Relative % 92 %   Neutro Abs 6.3 1.4 - 6.5 K/uL   Lymphocytes Relative 5 %   Lymphs Abs 0.3 (L) 1.0 - 3.6 K/uL   Monocytes Relative 3 %   Monocytes Absolute 0.2 0.2 - 0.9 K/uL   Eosinophils Relative 0 %   Eosinophils Absolute 0.0 0 - 0.7 K/uL   Basophils Relative 0 %   Basophils Absolute 0.0 0 - 0.1 K/uL    Comment: Performed at [facility], [address] [place], [place] [address]  Glucose, capillary     Status: Abnormal   Collection Time: 10/29/17  8:05 AM  Result Value Ref Range   Glucose-Capillary 133 (H) 65 - 99 mg/dL  Glucose, capillary     Status: Abnormal   Collection Time: 10/29/17 11:46 AM  Result Value Ref Range   Glucose-Capillary 136 (H) 65 - 99 mg/dL  Glucose, capillary     Status: Abnormal   Collection Time: 10/29/17  4:53 PM  Result Value Ref Range   Glucose-Capillary 138 (H) 65 - 99 mg/dL  Glucose, capillary     Status: Abnormal   Collection Time: 10/29/17  7:18 PM  Result Value Ref Range   Glucose-Capillary 112 (H) 65 - 99 mg/dL  Glucose, capillary     Status: Abnormal   Collection Time: 10/29/17 11:21 PM  Result Value Ref Range   Glucose-Capillary 108 (H) 65 - 99 mg/dL  Glucose, capillary     Status: Abnormal   Collection Time: 10/30/17  4:01 AM  Result Value Ref Range   Glucose-Capillary 109 (H) 65  - 99 mg/dL  Hemoglobin and hematocrit, blood     Status: Abnormal   Collection Time: 10/30/17  5:46 AM  Result Value Ref Range   Hemoglobin 8.0 (L) 12.0 - 16.0 g/dL   HCT 24.8 (L) 35.0 - 47.0 %    Comment: Performed at [facility], [address] [place], [place] [address]  Glucose, capillary     Status: Abnormal   Collection Time: 10/30/17  7:41 AM  Result Value Ref Range   Glucose-Capillary 132 (H) 65 - 99 mg/dL  Glucose, capillary     Status: Abnormal   Collection Time: 10/30/17 11:51 AM  Result Value Ref Range   Glucose-Capillary 117 (H) 65 - 99 mg/dL  CBC with Differential/Platelet     Status: Abnormal   Collection Time: 10/30/17  2:22 PM  Result Value Ref Range   WBC 6.6 3.6 - 11.0 K/uL   RBC 2.78 (L) 3.80 - 5.20 MIL/uL   Hemoglobin 8.2 (L) 12.0 - 16.0 g/dL   HCT 25.1 (L) 35.0 - 47.0 %   MCV 90.5 80.0 - 100.0 fL   MCH 29.4 26.0 - 34.0 pg   MCHC 32.5 32.0 - 36.0 g/dL   RDW 16.9 (H) 11.5 - 14.5 %   Platelets 234 150 - 440 K/uL   Neutrophils Relative % 88 %   Neutro Abs 5.8 1.4 - 6.5 K/uL   Lymphocytes Relative 7 %  Lymphs Abs 0.5 (L) 1.0 - 3.6 K/uL   Monocytes Relative 5 %   Monocytes Absolute 0.3 0.2 - 0.9 K/uL   Eosinophils Relative 0 %   Eosinophils Absolute 0.0 0 - 0.7 K/uL   Basophils Relative 0 %   Basophils Absolute 0.0 0 - 0.1 K/uL    Comment: Performed at [facility], [address] [place],  [address]  Glucose, capillary     Status: Abnormal   Collection Time: 10/30/17  4:41 PM  Result Value Ref Range   Glucose-Capillary 117 (H) 65 - 99 mg/dL    Current Facility-Administered Medications  Medication Dose Route Frequency Provider Last Rate Last Dose  . [START ON 10/31/2017] 0.9 %  sodium chloride infusion   Intravenous Continuous Sandi Mariscal, MD      . acetaminophen (TYLENOL) solution 650 mg  650 mg Per Tube Q6H PRN Cassandria Santee, MD   650 mg at 10/23/17 2340  . albuterol (PROVENTIL) (2.5 MG/3ML) 0.083% nebulizer solution 2.5  mg  2.5 mg Nebulization Q3H PRN Wilhelmina Mcardle, MD   2.5 mg at 10/19/17 0427  . ALPRAZolam Duanne Moron) tablet 0.25 mg  0.25 mg Oral TID PRN Flora Lipps, MD   0.25 mg at 10/28/17 1437  . aspirin chewable tablet 81 mg  81 mg Oral Daily Gladstone Lighter, MD   81 mg at 10/30/17 0900  . bisacodyl (DULCOLAX) suppository 10 mg  10 mg Rectal Daily PRN Awilda Bill, NP   10 mg at 10/09/17 0228  . budesonide (PULMICORT) nebulizer solution 0.5 mg  0.5 mg Nebulization BID Cassandria Santee, MD   0.5 mg at 10/30/17 0832  . chlorhexidine gluconate (MEDLINE KIT) (PERIDEX) 0.12 % solution 15 mL  15 mL Mouth Rinse BID Awilda Bill, NP   15 mL at 10/30/17 0749  . clonazepam (KLONOPIN) disintegrating tablet 0.5 mg  0.5 mg Oral TID ,  T, MD   0.5 mg at 10/30/17 1650  . cloNIDine (CATAPRES - Dosed in mg/24 hr) patch 0.3 mg  0.3 mg Transdermal Weekly Wilhelmina Mcardle, MD   0.3 mg at 10/29/17 1300  . famotidine (PEPCID) tablet 20 mg  20 mg Per Tube Daily Wilhelmina Mcardle, MD   20 mg at 10/30/17 0900  . feeding supplement (VITAL AF 1.2 CAL) liquid 1,000 mL  1,000 mL Per Tube Continuous Wilhelmina Mcardle, MD 45 mL/hr at 10/30/17 1600    . fentaNYL (DURAGESIC - dosed mcg/hr) 75 mcg  75 mcg Transdermal Q72H Wilhelmina Mcardle, MD   75 mcg at 10/29/17 1200  . free water 100 mL  100 mL Per Tube Q8H Wilhelmina Mcardle, MD   100 mL at 10/30/17 1412  . glycopyrrolate (ROBINUL) injection 0.2 mg  0.2 mg Intravenous TID PRN Tukov-Yual, Magdalene S, NP   0.2 mg at 10/28/17 0911  . heparin injection 5,000 Units  5,000 Units Subcutaneous Q8H [name], MD   5,000 Units at 10/30/17 1413  . hydrALAZINE (APRESOLINE) injection 10-40 mg  10-40 mg Intravenous Q4H PRN Wilhelmina Mcardle, MD   20 mg at 10/17/17 1455  . insulin aspart (novoLOG) injection 0-9 Units  0-9 Units Subcutaneous Q4H Awilda Bill, NP   1 Units at 10/30/17 0749  . ipratropium-albuterol (DUONEB) 0.5-2.5 (3) MG/3ML nebulizer solution 3 mL  3 mL  Nebulization Q6H Wilhelmina Mcardle, MD   3 mL at 10/30/17 1352  . levETIRAcetam (KEPPRA) 500 mg in sodium chloride 0.9 % 100 mL injection  Intravenous Q12H Saundra Shelling, MD 100 mL/hr at 10/28/17 0830    . LORazepam (ATIVAN) injection 2 mg  2 mg Intravenous Q1H PRN Flora Lipps, MD   2 mg at 10/30/17 0759  . MEDLINE mouth rinse  15 mL Mouth Rinse 10 times per day Awilda Bill, NP   15 mL at 10/30/17 1712  . metoprolol tartrate (LOPRESSOR) injection 2.5-5 mg  2.5-5 mg Intravenous Q3H PRN Wilhelmina Mcardle, MD   5 mg at 10/26/17 7867  . midazolam (VERSED) injection 2 mg  2 mg Intravenous Once Samaan, Maged, MD      . midazolam (VERSED) injection 2 mg  2 mg Intravenous Q4H PRN Cassandria Santee, MD   2 mg at 10/29/17 0547  . morphine 2 MG/ML injection 2 mg  2 mg Intravenous Q4H PRN Lafayette Dragon, MD   2 mg at 10/30/17 1342  . multivitamin liquid 15 mL  15 mL Per Tube Daily Samaan, Maged, MD   15 mL at 10/30/17 0900  . [START ON 10/31/2017] nutrition supplement (JUVEN) (JUVEN) powder packet 1 packet  1 packet Per Tube BID Samaan, Maged, MD      . PARoxetine (PAXIL) tablet 10 mg  10 mg Per Tube BID , Madie Reno, MD   10 mg at 10/30/17 0900  . QUEtiapine (SEROQUEL) tablet 50 mg  50 mg Per Tube QHS Flora Lipps, MD   50 mg at 10/29/17 2232  . risperiDONE (RISPERDAL) 1 MG/ML oral solution 2 mg  2 mg Per Tube QHS Wilhelmina Mcardle, MD   2 mg at 10/29/17 2234  . scopolamine (TRANSDERM-SCOP) 1 MG/3DAYS 1.5 mg  1 patch Transdermal Q72H Flora Lipps, MD   1.5 mg at 10/29/17 0913  . senna-docusate (Senokot-S) tablet 1 tablet  1 tablet Per Tube BID Saundra Shelling, MD   1 tablet at 10/30/17 0900  . sodium chloride flush (NS) 0.9 % injection 10-40 mL  10-40 mL Intracatheter PRN Fritzi Mandes, MD   10 mL at 10/03/17 2134  . sodium chloride flush (NS) 0.9 % injection 10-40 mL  10-40 mL Intracatheter Q12H Fritzi Mandes, MD   10 mL at 10/30/17 0902  . sodium chloride flush (NS) 0.9 % injection 10-40 mL  10-40 mL  Intracatheter PRN Fritzi Mandes, MD        Musculoskeletal: Strength & Muscle Tone: decreased Gait & Station: unable to stand Patient leans: N/A  Psychiatric Specialty Exam: Physical Exam  Nursing note and vitals reviewed. Constitutional: She appears well-developed.  HENT:  Head: Normocephalic and atraumatic.  Eyes: Pupils are equal, round, and reactive to light. Conjunctivae are normal.  Neck: Normal range of motion.  Cardiovascular: Normal heart sounds.  Respiratory:  Patient is receiving mechanical ventilation through tracheostomy  GI: Soft.  Musculoskeletal: Normal range of motion.  Neurological: She is alert.  Skin: Skin is warm and dry.    Review of Systems  Unable to perform ROS: Medical condition    Blood pressure 128/79, pulse (!) 106, temperature (!) 97.5 F (36.4 C), temperature source Axillary, resp. rate 20, height 4' 9" (1.448 m), weight 55.4 kg (122 lb 2.2 oz), last menstrual period 03/06/2005, SpO2 95 %.Body mass index is 26.43 kg/m.  General Appearance: Casual  Eye Contact:  Minimal  Speech:  Negative  Volume:  Decreased  Mood:  Negative  Affect:  Negative  Thought Process:  NA  Orientation:  Negative  Thought Content:  Negative  Suicidal Thoughts:  No  Homicidal Thoughts:  No  Memory:  Negative  Judgement:  Negative  Insight:  Negative  Psychomotor Activity:  Negative  Concentration:  Concentration: Negative  Recall:  Negative  Fund of Knowledge:  Negative  Language:  Negative  Akathisia:  Negative  Handed:  Right  AIMS (if indicated):     Assets:  Resilience  ADL's:  Impaired  Cognition:  Impaired,  Moderate  Sleep:        Treatment Plan Summary: Medication management and Plan Changes in medicine for anxiety so far seem to be tolerated.  Not oversedated.  Does not appear to need immediate change.  Continue medicine.  I will follow-up as needed.  Disposition: No evidence of imminent risk to self or others at present.   Patient does not  meet criteria for psychiatric inpatient admission.  Alethia Berthold, MD 10/30/2017 5:22 PM

## 2017-10-30 NOTE — Progress Notes (Signed)
Name: Michelle [name] MRN: [id number] DOB: 1960-09-21     CONSULTATION DATE: 09/30/2017  Subjective & objectives: Patient remains on the ventilator, less frequent panic attacks since antipsychotics have been adjusted by psychiatrist the Klonopin and Paxil and tolerating tube feeding.  PAST MEDICAL HISTORY :   has a past medical history of Allergy, Anxiety, Asthma, CHF (congestive heart failure) ([place]), Cocaine abuse ([place]), COPD (chronic obstructive pulmonary disease) ([place]), Coronary artery disease, Emphysema/COPD ([place]), Hepatitis C, High cholesterol, Hypertension, Pneumothorax, and Tobacco abuse.  has a past surgical history that includes Cardiac catheterization; Chest tube insertion; and Tracheostomy tube placement (N/A, 10/17/2017). Prior to Admission medications   Medication Sig Start Date End Date Taking? Authorizing Provider  albuterol (PROVENTIL HFA;VENTOLIN HFA) 108 (90 Base) MCG/ACT inhaler Inhale 2 puffs into the lungs every 6 (six) hours as needed for wheezing or shortness of breath. 02/26/17  Yes Dustin Flock, MD  ALPRAZolam Duanne Moron) 0.5 MG tablet Take 1 tablet (0.5 mg total) by mouth 3 (three) times daily as needed for anxiety. 08/24/17  Yes Bettey Costa, MD  aspirin EC 81 MG tablet Take 81 mg by mouth daily.   Yes [provider]  ipratropium-albuterol (DUONEB) 0.5-2.5 (3) MG/3ML SOLN Take 3 mLs by nebulization every 6 (six) hours as needed. 01/04/17  Yes [name], MD  mirtazapine (REMERON) 30 MG tablet Take 30 mg by mouth at bedtime.   Yes [provider]  predniSONE (DELTASONE) 10 MG tablet Take 1 tablet (10 mg total) by mouth daily with breakfast. 08/24/17  Yes Mody, Sital, MD  SYMBICORT 160-4.5 MCG/ACT inhaler Take 2 puffs by mouth 2 (two) times daily.   Yes [provider]  tiotropium (SPIRIVA) 18 MCG inhalation capsule Place 1 capsule (18 mcg total) into inhaler and inhale daily. 01/04/17  Yes [name], MD  carvedilol (COREG)  3.125 MG tablet Take 1 tablet (3.125 mg total) by mouth every morning. Patient not taking: Reported on 09/30/2017 02/10/16   Gladstone Lighter, MD  enalapril (VASOTEC) 10 MG tablet Take 0.5 tablets (5 mg total) by mouth daily. Patient not taking: Reported on 09/30/2017 02/10/16   Gladstone Lighter, MD  feeding supplement, ENSURE ENLIVE, (ENSURE ENLIVE) LIQD Take 237 mLs by mouth 2 (two) times daily between meals. 08/24/17   Bettey Costa, MD  guaiFENesin-codeine 100-10 MG/5ML syrup Take 10 mLs by mouth every 4 (four) hours as needed for cough. Patient not taking: Reported on 08/20/2017 07/02/17   Demetrios Loll, MD  levofloxacin (LEVAQUIN) 750 MG tablet Take 1 tablet (750 mg total) by mouth daily. Patient not taking: Reported on 09/30/2017 08/24/17   Bettey Costa, MD  polyethylene glycol (GOLYTELY) 236 g solution Drink one 8 oz glass every 20 mins until stools are clear starting at 5:00pm on 08/14/17 Patient not taking: Reported on 09/30/2017 07/26/17   Lucilla Lame, MD   Allergies  Allergen Reactions  . Carvedilol Other (See Comments)    Pt gets "sick" when on medication    FAMILY HISTORY:  family history includes Asthma in her mother; Breast cancer (age of onset: 24) in her maternal aunt; CAD in her father; COPD in her sister; CVA in her father; Lung cancer in her mother; Stroke in her father. SOCIAL HISTORY:  reports that she has been smoking cigarettes.  She has a 4.10 pack-year smoking history. She has never used smokeless tobacco. She reports that she has current or past drug history. Drug: "Crack" cocaine. She reports that she does not drink alcohol.  REVIEW OF SYSTEMS:  Unable to obtain due to critical illness   VITAL SIGNS: Temp:  [98.1 F (36.7 C)-99.2 F (37.3 C)] 98.9 F (37.2 C) (05/29 1200) Pulse Rate:  [47-118] 87 (05/29 1225) Resp:  [15-32] 22 (05/29 1225) BP: (86-141)/(56-93) 94/63 (05/29 1200) SpO2:  [94 %-100 %] 99 % (05/29 1225) FiO2 (%):  [24 %] 24 % (05/29 1200) Weight:   [55.4 kg (122 lb 2.2 oz)] 55.4 kg (122 lb 2.2 oz) (05/29 0258)  Physical Examination:   Awake and following commands,less  frequent episodes of panic with respiratory distress. Moves all extremities with generalized weakness however does not cooperate with full neurological exam Trach in place, on vent, bilateral air entry is equal with no adventitious sounds while sedated however once awake she is panicking with severe respiratory distress and no air movements. S1 & S2 are audible with no murmur Benign abdomen was normal peristalsis Sacral decub stage II. With the extremities no leg edema    ASSESSMENT / PLAN:  Acute on chronic respiratory failure status post placement of tracheostomy on 10/17/2017 with ventilator dependency. Patient continued to have poor weaning parameters from the ventilator because of what seems to be panic episodes with tapering sedation and CPAP pressure support trials.  -Vent and trach weaning as tolerated.  COPD with frequent exacerbations and reactive airway disease. -Bronchial dilators + inhaled steroids  Anxiety and panic attacks.As discussed with the family at the bedside that the patient has been having frequent and severe panic attacks for a long time and she was on Ativan.MRI of the brain 5/23 did not show any acute intracranial abnormalities. -Klonapin + Paxil  + Seroquel + Respirable+ Ativan PRN -Consider psych evaluation to optimize antipsychotics.   HTN.  Echo in 2017 LVEF 55 to 60% -Optimize antihypertensives and monitor hemodynamics.  Questionable seizure activity.On Keppra  Sacral decub stage II -Wound care  Anemia -Keep hemoglobin more than 7 g/dL.  Deconditioning -Supportive care  Dysphagia awaiting placement of PEG for feedingi nam by IR  No DVT Rt U E as per venous Doppler.  Follow with case management for placement  Full code  DVT &GI prophylaxis. Continue with supportive care   Critical care time  30 minutes

## 2017-10-30 NOTE — Progress Notes (Signed)
[facility] NAME: Odessa Nishi    MR#:  [id number]  DATE OF BIRTH:  June 08, 1960  SUBJECTIVE:  CHIEF COMPLAINT:   Chief Complaint  Patient presents with  . Shortness of Breath  Intensivist input greatly appreciated, awaiting PEG tube placement, difficult vent wean  REVIEW OF SYSTEMS:  CONSTITUTIONAL: No fever, fatigue or weakness.  EYES: No blurred or double vision.  EARS, NOSE, AND THROAT: No tinnitus or ear pain.  RESPIRATORY: No cough, shortness of breath, wheezing or hemoptysis.  CARDIOVASCULAR: No chest pain, orthopnea, edema.  GASTROINTESTINAL: No nausea, vomiting, diarrhea or abdominal pain.  GENITOURINARY: No dysuria, hematuria.  ENDOCRINE: No polyuria, nocturia,  HEMATOLOGY: No anemia, easy bruising or bleeding SKIN: No rash or lesion. MUSCULOSKELETAL: No joint pain or arthritis.   NEUROLOGIC: No tingling, numbness, weakness.  PSYCHIATRY: No anxiety or depression.   ROS  DRUG ALLERGIES:   Allergies  Allergen Reactions  . Carvedilol Other (See Comments)    Pt gets "sick" when on medication    VITALS:  Blood pressure 112/68, pulse 90, temperature 98.9 F (37.2 C), temperature source Axillary, resp. rate 20, height 4\' 9"  (1.448 m), weight 55.4 kg (122 lb 2.2 oz), last menstrual period 03/06/2005, SpO2 100 %.  PHYSICAL EXAMINATION:  GENERAL:  57 y.o.-year-old patient lying in the bed with no acute distress.  EYES: Pupils equal, round, reactive to light and accommodation. No scleral icterus. Extraocular muscles intact.  HEENT: Head atraumatic, normocephalic. Oropharynx and nasopharynx clear.  NECK:  Supple, no jugular venous distention. No thyroid enlargement, no tenderness.  LUNGS: Normal breath sounds bilaterally, no wheezing, rales,rhonchi or crepitation. No use of accessory muscles of respiration.  CARDIOVASCULAR: S1, S2 normal. No murmurs, rubs, or gallops.  ABDOMEN: Soft, nontender, nondistended. Bowel sounds  present. No organomegaly or mass.  EXTREMITIES: No pedal edema, cyanosis, or clubbing.  NEUROLOGIC: Cranial nerves II through XII are intact. Muscle strength 5/5 in all extremities. Sensation intact. Gait not checked.  PSYCHIATRIC: The patient is alert and oriented x 3.  SKIN: No obvious rash, lesion, or ulcer.   Physical Exam LABORATORY PANEL:   CBC Recent Labs  Lab 10/29/17 0408 10/30/17 0546  WBC 6.8  --   HGB 7.6* 8.0*  HCT 23.5* 24.8*  PLT 193  --    ------------------------------------------------------------------------------------------------------------------  Chemistries  Recent Labs  Lab 10/27/17 0532 10/29/17 0408  NA 138 137  K 3.5 3.8  CL 103 102  CO2 30 30  GLUCOSE 151* 153*  BUN 27* 25*  CREATININE 0.35* <0.30*  CALCIUM 8.9 8.8*  MG 1.8  --   AST  --  17  ALT  --  40  ALKPHOS  --  69  BILITOT  --  0.4   ------------------------------------------------------------------------------------------------------------------  Cardiac Enzymes No results for input(s): TROPONINI in the last 168 hours. ------------------------------------------------------------------------------------------------------------------  RADIOLOGY:  US Venous Img Upper Uni Right  Result Date: 10/30/2017 CLINICAL DATA:  Right upper extremity edema. History of right upper extremity approach PICC line. Evaluate for DVT. EXAM: RIGHT UPPER EXTREMITY VENOUS DOPPLER ULTRASOUND TECHNIQUE: Gray-scale sonography with graded compression, as well as color Doppler and duplex ultrasound were performed to evaluate the upper extremity deep venous system from the level of the subclavian vein and including the jugular, axillary, basilic, radial, ulnar and upper cephalic vein. Spectral Doppler was utilized to evaluate flow at rest and with distal augmentation maneuvers. COMPARISON:  None. FINDINGS: The mid aspect of the brachial veins was not imaged  secondary to overlying bandage associated with right  upper extremity approach PICC line. Contralateral Subclavian Vein: Respiratory phasicity is normal and symmetric with the symptomatic side. No evidence of thrombus. Normal compressibility. Internal Jugular Vein: No evidence of thrombus. Normal compressibility, respiratory phasicity and response to augmentation. Subclavian Vein: No evidence of thrombus. Normal compressibility, respiratory phasicity and response to augmentation. The mid aspect of the right upper extremity approach PICC line is seen within the subclavian vein without associated pericatheter thrombus (representative image 9). Axillary Vein: No evidence of thrombus. Normal compressibility, respiratory phasicity and response to augmentation. Cephalic Vein: No evidence of thrombus. Normal compressibility, respiratory phasicity and response to augmentation. Basilic Vein: No evidence of thrombus. Normal compressibility, respiratory phasicity and response to augmentation. Brachial Veins: No evidence of thrombus. Normal compressibility, respiratory phasicity and response to augmentation. Radial Veins: No evidence of thrombus. Normal compressibility, respiratory phasicity and response to augmentation. Ulnar Veins: No evidence of thrombus. Normal compressibility, respiratory phasicity and response to augmentation. Other Findings: There is a moderate amount of subcutaneous edema at the level of the upper arm and forearm. IMPRESSION: No evidence of DVT within the right upper extremity. Electronically Signed   By: Sandi Mariscal M.D.   On: 10/30/2017 09:47   Dg Chest Port 1 View  Result Date: 10/29/2017 CLINICAL DATA:  57 year old female with tracheostomy tube in place. Subsequent encounter. EXAM: PORTABLE CHEST 1 VIEW COMPARISON:  10/23/2017 chest x-ray. FINDINGS: Tracheostomy tube tip midline 7.8 cm above the carina. Right central line tip proximal superior vena cava level. Nasogastric tube courses below the diaphragm. Tip is not included on the present exam.  Hyperinflated lungs/COPD. No infiltrate, congestive heart failure or pneumothorax. Subsegmental atelectasis medial aspect right lung base. Heart size within normal limits. Calcified aorta. IMPRESSION: Hyperinflated lungs/COPD. Subsegmental atelectasis medial aspect right lung base. Aortic Atherosclerosis (ICD10-I70.0). Electronically Signed   By: Genia Del M.D.   On: 10/29/2017 07:02    ASSESSMENT AND PLAN:  *Acute on chronic hypoxic respiratory failure  Stable status post trach placement Oct 17, 2017 Secondary to COPD exacerbation Difficult weaning off the vent, continue CPAP trials and weaning as tolerated   *Acute on COPD exacerbation Stable Steroids, aggressive pulmonary toilet bronchodilator therapy, supplemental oxygen wean as tolerated  *Chronic benign essential hypertension Controlled on current regiment  *Acute on chronic general anxiety disorder, unspecified Continue Paxil, Seroquel, and benzodiazepine agents  *Acute dysphasia Per PEG by IR  *Chronic stage II sacral decubitus wound Stable continue local wound care   All the records are reviewed and case discussed with Care Management/Social Workerr. Management plans discussed with the patient, family and they are in agreement.  CODE STATUS: partial  TOTAL TIME TAKING CARE OF THIS PATIENT: 35 minutes.     POSSIBLE D/C IN 3-5 DAYS, DEPENDING ON CLINICAL CONDITION.   Avel Peace Jeanenne Licea M.D on 10/30/2017   Between 7am to 6pm - Pager - [phone]  After 6pm go to [email or web address] - password [email or web address] [facility]  Office  [phone]  CC: Primary care physician; Center, [facility]  Note: This dictation was prepared with Dragon dictation along with smaller phrase technology. Any transcriptional errors that result from this process are unintentional.

## 2017-10-31 ENCOUNTER — Inpatient Hospital Stay: Payer: Medicaid Other

## 2017-10-31 ENCOUNTER — Encounter: Payer: Self-pay | Admitting: Interventional Radiology

## 2017-10-31 ENCOUNTER — Other Ambulatory Visit: Payer: Self-pay

## 2017-10-31 HISTORY — PX: IR GASTROSTOMY TUBE MOD SED: IMG625

## 2017-10-31 LAB — GLUCOSE, CAPILLARY
GLUCOSE-CAPILLARY: 87 mg/dL (ref 65–99)
GLUCOSE-CAPILLARY: 90 mg/dL (ref 65–99)
Glucose-Capillary: 90 mg/dL (ref 65–99)
Glucose-Capillary: 94 mg/dL (ref 65–99)
Glucose-Capillary: 78 mg/dL (ref 65–99)

## 2017-10-31 LAB — COMPREHENSIVE METABOLIC PANEL
ALBUMIN: 2.1 g/dL — AB (ref 3.5–5.0)
ALT: 27 U/L (ref 14–54)
ANION GAP: 7 (ref 5–15)
AST: 16 U/L (ref 15–41)
Alkaline Phosphatase: 46 U/L (ref 38–126)
BILIRUBIN TOTAL: 0.5 mg/dL (ref 0.3–1.2)
BUN: 17 mg/dL (ref 6–20)
CO2: 27 mmol/L (ref 22–32)
Calcium: 7.9 mg/dL — ABNORMAL LOW (ref 8.9–10.3)
Chloride: 104 mmol/L (ref 101–111)
Creatinine, Ser: <0.3 mg/dL — ABNORMAL LOW (ref 0.44–1.00)
GLUCOSE: 90 mg/dL (ref 65–99)
POTASSIUM: 3.4 mmol/L — AB (ref 3.5–5.1)
SODIUM: 138 mmol/L (ref 135–145)
TOTAL PROTEIN: 4.6 g/dL — AB (ref 6.5–8.1)

## 2017-10-31 LAB — APTT: aPTT: 26 seconds (ref 24–36)

## 2017-10-31 LAB — PROTIME-INR
INR: 1.06
Prothrombin Time: 13.7 seconds (ref 11.4–15.2)

## 2017-10-31 LAB — MAGNESIUM: Magnesium: 1.7 mg/dL (ref 1.7–2.4)

## 2017-10-31 LAB — PHOSPHORUS: Phosphorus: 3.1 mg/dL (ref 2.5–4.6)

## 2017-10-31 MED ORDER — FENTANYL CITRATE (PF) 100 MCG/2ML IJ SOLN
INTRAMUSCULAR | Status: AC
  Filled 2017-10-31: qty 2

## 2017-10-31 MED ORDER — LIDOCAINE HCL (PF) 1 % IJ SOLN
INTRAMUSCULAR | Status: AC
  Filled 2017-10-31: qty 30

## 2017-10-31 MED ORDER — GLUCAGON HCL (RDNA) 1 MG IJ SOLR
INTRAMUSCULAR | Status: AC | PRN
  Administered 2017-10-31: 1 mg via INTRAVENOUS

## 2017-10-31 MED ORDER — CEFAZOLIN SODIUM-DEXTROSE 2-4 GM/100ML-% IV SOLN
INTRAVENOUS | Status: AC
  Filled 2017-10-31: qty 100

## 2017-10-31 MED ORDER — MAGNESIUM SULFATE 2 GM/50ML IV SOLN
2.0000 g | Freq: Once | INTRAVENOUS | Status: AC
Start: 2017-10-31 — End: 2017-10-31
  Administered 2017-10-31: 2 g via INTRAVENOUS
  Filled 2017-10-31: qty 50

## 2017-10-31 MED ORDER — CLONAZEPAM 0.125 MG PO TBDP
0.5000 mg | ORAL_TABLET | Freq: Three times a day (TID) | ORAL | Status: DC
  Administered 2017-10-31 – 2017-11-01 (×3): 0.5 mg via ORAL
  Filled 2017-10-31 (×3): qty 4

## 2017-10-31 MED ORDER — CEFAZOLIN SODIUM-DEXTROSE 2-4 GM/100ML-% IV SOLN
2.0000 g | Freq: Once | INTRAVENOUS | Status: AC
  Administered 2017-10-31: 2 g via INTRAVENOUS
  Filled 2017-10-31: qty 100

## 2017-10-31 MED ORDER — GLUCAGON HCL RDNA (DIAGNOSTIC) 1 MG IJ SOLR
INTRAMUSCULAR | Status: AC
  Filled 2017-10-31: qty 1

## 2017-10-31 MED ORDER — MIDAZOLAM HCL 5 MG/5ML IJ SOLN
INTRAMUSCULAR | Status: AC
  Filled 2017-10-31: qty 5

## 2017-10-31 MED ORDER — SODIUM CHLORIDE 0.9 % IV SOLN
INTRAVENOUS | Status: DC
  Administered 2017-10-31: 16:00:00 via INTRAVENOUS

## 2017-10-31 MED ORDER — POTASSIUM CHLORIDE 10 MEQ/50ML IV SOLN
10.0000 meq | INTRAVENOUS | Status: AC
  Administered 2017-10-31 (×2): 10 meq via INTRAVENOUS
  Filled 2017-10-31 (×2): qty 50

## 2017-10-31 MED ORDER — FENTANYL CITRATE (PF) 100 MCG/2ML IJ SOLN
INTRAMUSCULAR | Status: AC | PRN
  Administered 2017-10-31 (×2): 50 ug via INTRAVENOUS

## 2017-10-31 MED ORDER — MIDAZOLAM HCL 5 MG/5ML IJ SOLN
INTRAMUSCULAR | Status: AC | PRN
  Administered 2017-10-31 (×2): 1 mg via INTRAVENOUS

## 2017-10-31 MED ORDER — PAROXETINE HCL 20 MG PO TABS
20.0000 mg | ORAL_TABLET | Freq: Two times a day (BID) | ORAL | Status: DC
  Administered 2017-10-31 – 2017-12-24 (×108): 20 mg
  Filled 2017-10-31 (×113): qty 1

## 2017-10-31 MED ORDER — IOPAMIDOL (ISOVUE-300) INJECTION 61%
30.0000 mL | Freq: Once | INTRAVENOUS | Status: AC | PRN
  Administered 2017-10-31: 10 mL via INTRAVENOUS

## 2017-10-31 NOTE — Consult Note (Signed)
[facility] Consult   Reason for Consult: Follow-up consult 57 year old woman with chronic anxiety Referring Physician: Salary Patient Identification: Michelle [name] MRN:  [id number] Principal Diagnosis: Panic disorder Diagnosis:   Patient Active Problem List   Diagnosis Date Noted  . Panic disorder [F41.0] 10/29/2017  . Altered mental status [R41.82]   . Pressure injury of skin [L89.90] 10/23/2017  . Acute on chronic respiratory failure ([place]) [J96.20] 09/30/2017  . Acute respiratory failure ([place]) [J96.00] 08/20/2017  . Acute on chronic respiratory failure with hypoxia and hypercapnia (HCC) [T41.96, J96.22] 06/29/2017  . Acute on chronic respiratory failure with hypoxia ([place]) [J96.21] 02/23/2017  . Protein-calorie malnutrition, severe [E43] 05/23/2016  . Sepsis ([place]) [A41.9] 05/22/2016  . Malnutrition of moderate degree ([place]) [E44.0] 10/25/2014  . COPD exacerbation ([place]) [J44.1] 10/24/2014  . Tobacco abuse [Z72.0] 10/24/2014    Total Time spent with patient: 20 minutes  Subjective:   Michelle [name] is a 57 y.o. female patient admitted with patient not able to give direct information but does not that she is still feeling anxious.Michelle [name]  HPI: Patient has calm down a little bit objectively but she tells me by nodding that she feels that she is still having a lot of anxiety.  I got to speak to 1 of her family members who tells me that the patient's anxiety has never been well controlled even when she was at home.  Vital signs look like they have improved.  She does not appear to be oversedated.  Past Psychiatric History: Past history of chronic anxiety and panic attacks  Risk to Self:   Risk to Others:   Prior Inpatient Therapy:   Prior Outpatient Therapy:    Past Medical History:  Past Medical History:  Diagnosis Date  . Allergy   . Anxiety   . Asthma   . CHF (congestive heart failure) ([place])   . Cocaine abuse ([place])   . COPD (chronic obstructive pulmonary disease)  ([place])   . Coronary artery disease   . Emphysema/COPD ([place])   . Hepatitis C   . High cholesterol   . Hypertension   . Pneumothorax   . Tobacco abuse     Past Surgical History:  Procedure Laterality Date  . CARDIAC CATHETERIZATION    . CHEST TUBE INSERTION    . IR GASTROSTOMY TUBE MOD SED  10/31/2017  . TRACHEOSTOMY TUBE PLACEMENT N/A 10/17/2017   Procedure: TRACHEOSTOMY;  Surgeon: Carloyn Manner, MD;  Location: [facility] ORS;  Service: ENT;  Laterality: N/A;   Family History:  Family History  Problem Relation Age of Onset  . Lung cancer Mother   . Asthma Mother   . CVA Father   . CAD Father   . Stroke Father   . Breast cancer Maternal Aunt 67  . COPD Sister    Family Psychiatric  History: Anxiety Social History:  Social History   Substance and Sexual Activity  Alcohol Use No  . Frequency: Never     Social History   Substance and Sexual Activity  Drug Use Yes  . Types: "Crack" cocaine   Comment: Past cocaine use.  Quit 04/2017    Social History   Socioeconomic History  . Marital status: Single    Spouse name: Not on file  . Number of children: Not on file  . Years of education: Not on file  . Highest education level: Not on file  Occupational History  . Not on file  Social Needs  . Financial resource strain: Somewhat  hard  . Food insecurity:    Worry: Sometimes true    Inability: Sometimes true  . Transportation needs:    Medical: Yes    Non-medical: Yes  Tobacco Use  . Smoking status: Current Every Day Smoker    Packs/day: 0.10    Years: 41.00    Pack years: 4.10    Types: Cigarettes  . Smokeless tobacco: Never Used  Substance and Sexual Activity  . Alcohol use: No    Frequency: Never  . Drug use: Yes    Types: "Crack" cocaine    Comment: Past cocaine use.  Quit 04/2017  . Sexual activity: Not Currently    Partners: Male    Birth control/protection: Post-menopausal  Lifestyle  . Physical activity:    Days per week: 0 days    Minutes per  session: 0 min  . Stress: To some extent  Relationships  . Social connections:    Talks on phone: Patient refused    Gets together: Patient refused    Attends religious service: Patient refused    Active member of club or organization: Patient refused    Attends meetings of clubs or organizations: Patient refused    Relationship status: Patient refused  Other Topics Concern  . Not on file  Social History Narrative  . Not on file   Additional Social History:    Allergies:   Allergies  Allergen Reactions  . Carvedilol Other (See Comments)    Pt gets "sick" when on medication    Labs:  Results for orders placed or performed during the hospital encounter of 09/30/17 (from the past 48 hour(s))  Glucose, capillary     Status: Abnormal   Collection Time: 10/29/17  7:18 PM  Result Value Ref Range   Glucose-Capillary 112 (H) 65 - 99 mg/dL  Glucose, capillary     Status: Abnormal   Collection Time: 10/29/17 11:21 PM  Result Value Ref Range   Glucose-Capillary 108 (H) 65 - 99 mg/dL  Glucose, capillary     Status: Abnormal   Collection Time: 10/30/17  4:01 AM  Result Value Ref Range   Glucose-Capillary 109 (H) 65 - 99 mg/dL  Hemoglobin and hematocrit, blood     Status: Abnormal   Collection Time: 10/30/17  5:46 AM  Result Value Ref Range   Hemoglobin 8.0 (L) 12.0 - 16.0 g/dL   HCT 24.8 (L) 35.0 - 47.0 %    Comment: Performed at [name], [address] [place], [place] [address]  Glucose, capillary     Status: Abnormal   Collection Time: 10/30/17  7:41 AM  Result Value Ref Range   Glucose-Capillary 132 (H) 65 - 99 mg/dL  Glucose, capillary     Status: Abnormal   Collection Time: 10/30/17 11:51 AM  Result Value Ref Range   Glucose-Capillary 117 (H) 65 - 99 mg/dL  CBC with Differential/Platelet     Status: Abnormal   Collection Time: 10/30/17  2:22 PM  Result Value Ref Range   WBC 6.6 3.6 - 11.0 K/uL   RBC 2.78 (L) 3.80 - 5.20 MIL/uL   Hemoglobin 8.2 (L)  12.0 - 16.0 g/dL   HCT 25.1 (L) 35.0 - 47.0 %   MCV 90.5 80.0 - 100.0 fL   MCH 29.4 26.0 - 34.0 pg   MCHC 32.5 32.0 - 36.0 g/dL   RDW 16.9 (H) 11.5 - 14.5 %   Platelets 234 150 - 440 K/uL   Neutrophils Relative % 88 %   Neutro  Abs 5.8 1.4 - 6.5 K/uL   Lymphocytes Relative 7 %   Lymphs Abs 0.5 (L) 1.0 - 3.6 K/uL   Monocytes Relative 5 %   Monocytes Absolute 0.3 0.2 - 0.9 K/uL   Eosinophils Relative 0 %   Eosinophils Absolute 0.0 0 - 0.7 K/uL   Basophils Relative 0 %   Basophils Absolute 0.0 0 - 0.1 K/uL    Comment: Performed at [facility], [address] [place], [place] [address]  Glucose, capillary     Status: Abnormal   Collection Time: 10/30/17  4:41 PM  Result Value Ref Range   Glucose-Capillary 117 (H) 65 - 99 mg/dL  Glucose, capillary     Status: Abnormal   Collection Time: 10/30/17  7:42 PM  Result Value Ref Range   Glucose-Capillary 112 (H) 65 - 99 mg/dL  Glucose, capillary     Status: Abnormal   Collection Time: 10/30/17 11:50 PM  Result Value Ref Range   Glucose-Capillary 110 (H) 65 - 99 mg/dL  Glucose, capillary     Status: None   Collection Time: 10/31/17  3:19 AM  Result Value Ref Range   Glucose-Capillary 87 65 - 99 mg/dL  Comprehensive metabolic panel     Status: Abnormal   Collection Time: 10/31/17  4:12 AM  Result Value Ref Range   Sodium 138 135 - 145 mmol/L   Potassium 3.4 (L) 3.5 - 5.1 mmol/L   Chloride 104 101 - 111 mmol/L   CO2 27 22 - 32 mmol/L   Glucose, Bld 90 65 - 99 mg/dL   BUN 17 6 - 20 mg/dL   Creatinine, Ser <0.30 (L) 0.44 - 1.00 mg/dL   Calcium 7.9 (L) 8.9 - 10.3 mg/dL   Total Protein 4.6 (L) 6.5 - 8.1 g/dL   Albumin 2.1 (L) 3.5 - 5.0 g/dL   AST 16 15 - 41 U/L   ALT 27 14 - 54 U/L   Alkaline Phosphatase 46 38 - 126 U/L   Total Bilirubin 0.5 0.3 - 1.2 mg/dL   GFR calc non Af Amer NOT CALCULATED >60 mL/min   GFR calc Af Amer NOT CALCULATED >60 mL/min    Comment: (NOTE) The eGFR has been calculated using the CKD EPI  equation. This calculation has not been validated in all clinical situations. eGFR's persistently <60 mL/min signify possible Chronic Kidney Disease.    Anion gap 7 5 - 15    Comment: Performed at [facility], [address] [place], [place] [address]  Magnesium     Status: None   Collection Time: 10/31/17  4:12 AM  Result Value Ref Range   Magnesium 1.7 1.7 - 2.4 mg/dL    Comment: Performed at [facility], [address] [place], [place] [address]  Phosphorus     Status: None   Collection Time: 10/31/17  4:12 AM  Result Value Ref Range   Phosphorus 3.1 2.5 - 4.6 mg/dL    Comment: Performed at [facility], [address] [place],  [address]  Glucose, capillary     Status: None   Collection Time: 10/31/17  7:08 AM  Result Value Ref Range   Glucose-Capillary 90 65 - 99 mg/dL  Protime-INR     Status: None   Collection Time: 10/31/17  8:45 AM  Result Value Ref Range   Prothrombin Time 13.7 11.4 - 15.2 seconds   INR 1.06     Comment: Performed at [facility], [address] [place],  [place] [address]  APTT     Status: None   Collection Time: 10/31/17  8:45 AM  Result Value Ref Range   aPTT 26 24 - 36 seconds    Comment: Performed at [facility], [place], [place] [address]  Glucose, capillary     Status: None   Collection Time: 10/31/17 11:07 AM  Result Value Ref Range   Glucose-Capillary 94 65 - 99 mg/dL    Current Facility-Administered Medications  Medication Dose Route Frequency Provider Last Rate Last Dose  . 0.9 %  sodium chloride infusion   Intravenous Continuous Sandi Mariscal, MD   Stopped at 10/31/17 1530  . 0.9 %  sodium chloride infusion   Intravenous Continuous Soyla Murphy, Maged, MD 50 mL/hr at 10/31/17 1600    . acetaminophen (TYLENOL) solution 650 mg  650 mg Per Tube Q6H PRN Cassandria Santee, MD   650 mg at 10/23/17 2340  . albuterol (PROVENTIL) (2.5 MG/3ML) 0.083% nebulizer solution 2.5 mg  2.5  mg Nebulization Q3H PRN Wilhelmina Mcardle, MD   2.5 mg at 10/19/17 0427  . aspirin chewable tablet 81 mg  81 mg Oral Daily Gladstone Lighter, MD   81 mg at 10/30/17 0900  . bisacodyl (DULCOLAX) suppository 10 mg  10 mg Rectal Daily PRN Awilda Bill, NP   10 mg at 10/09/17 0228  . budesonide (PULMICORT) nebulizer solution 0.5 mg  0.5 mg Nebulization BID Soyla Murphy, Maged, MD   0.5 mg at 10/31/17 0745  . chlorhexidine gluconate (MEDLINE KIT) (PERIDEX) 0.12 % solution 15 mL  15 mL Mouth Rinse BID Awilda Bill, NP   15 mL at 10/31/17 0729  . clonazepam (KLONOPIN) disintegrating tablet 0.5 mg  0.5 mg Oral TID WC & HS Blanch Stang T, MD      . cloNIDine (CATAPRES - Dosed in mg/24 hr) patch 0.3 mg  0.3 mg Transdermal Weekly Wilhelmina Mcardle, MD   0.3 mg at 10/29/17 1300  . famotidine (PEPCID) tablet 20 mg  20 mg Per Tube Daily Wilhelmina Mcardle, MD   20 mg at 10/30/17 0900  . feeding supplement (VITAL AF 1.2 CAL) liquid 1,000 mL  1,000 mL Per Tube Continuous Wilhelmina Mcardle, MD   Stopped at 10/31/17 0000  . fentaNYL (DURAGESIC - dosed mcg/hr) 75 mcg  75 mcg Transdermal Q72H Wilhelmina Mcardle, MD   75 mcg at 10/29/17 1200  . free water 100 mL  100 mL Per Tube Q8H Wilhelmina Mcardle, MD   100 mL at 10/30/17 2200  . glycopyrrolate (ROBINUL) injection 0.2 mg  0.2 mg Intravenous TID PRN Tukov-Yual, Magdalene S, NP   0.2 mg at 10/28/17 0911  . heparin injection 5,000 Units  5,000 Units Subcutaneous Q8H [name], MD   5,000 Units at 10/30/17 2138  . hydrALAZINE (APRESOLINE) injection 10-40 mg  10-40 mg Intravenous Q4H PRN Wilhelmina Mcardle, MD   20 mg at 10/17/17 1455  . insulin aspart (novoLOG) injection 0-9 Units  0-9 Units Subcutaneous Q4H Awilda Bill, NP   1 Units at 10/30/17 0749  . ipratropium-albuterol (DUONEB) 0.5-2.5 (3) MG/3ML nebulizer solution 3 mL  3 mL Nebulization Q6H Wilhelmina Mcardle, MD   3 mL at 10/31/17 1325  . levETIRAcetam (KEPPRA) 500 mg in sodium chloride 0.9 % 100 mL  injection   Intravenous Q12H Pyreddy, Reatha Harps, MD 100 mL/hr at 10/28/17 0830    . LORazepam (ATIVAN) injection 2 mg  2 mg Intravenous Q1H PRN Kasa,  Maretta Bees, MD   2 mg at 10/31/17 1143  . MEDLINE mouth rinse  15 mL Mouth Rinse 10 times per day Awilda Bill, NP   15 mL at 10/31/17 1633  . metoprolol tartrate (LOPRESSOR) injection 2.5-5 mg  2.5-5 mg Intravenous Q3H PRN Wilhelmina Mcardle, MD   5 mg at 10/26/17 4696  . midazolam (VERSED) injection 2 mg  2 mg Intravenous Once Samaan, Maged, MD      . morphine 2 MG/ML injection 2 mg  2 mg Intravenous Q4H PRN Lafayette Dragon, MD   2 mg at 10/30/17 1342  . multivitamin liquid 15 mL  15 mL Per Tube Daily Samaan, Maged, MD   15 mL at 10/30/17 0900  . nutrition supplement (JUVEN) (JUVEN) powder packet 1 packet  1 packet Per Tube BID Samaan, Maged, MD      . PARoxetine (PAXIL) tablet 20 mg  20 mg Per Tube BID Emeterio Balke T, MD      . QUEtiapine (SEROQUEL) tablet 50 mg  50 mg Per Tube QHS Flora Lipps, MD   50 mg at 10/30/17 2139  . risperiDONE (RISPERDAL) 1 MG/ML oral solution 2 mg  2 mg Per Tube QHS Wilhelmina Mcardle, MD   2 mg at 10/30/17 2139  . scopolamine (TRANSDERM-SCOP) 1 MG/3DAYS 1.5 mg  1 patch Transdermal Q72H Flora Lipps, MD   1.5 mg at 10/29/17 0913  . senna-docusate (Senokot-S) tablet 1 tablet  1 tablet Per Tube BID Saundra Shelling, MD   1 tablet at 10/30/17 2139  . sodium chloride flush (NS) 0.9 % injection 10-40 mL  10-40 mL Intracatheter PRN Fritzi Mandes, MD   10 mL at 10/03/17 2134  . sodium chloride flush (NS) 0.9 % injection 10-40 mL  10-40 mL Intracatheter Q12H Fritzi Mandes, MD   10 mL at 10/31/17 0802  . sodium chloride flush (NS) 0.9 % injection 10-40 mL  10-40 mL Intracatheter PRN Fritzi Mandes, MD        Musculoskeletal: Strength & Muscle Tone: decreased Gait & Station: unable to stand Patient leans: N/A  Psychiatric Specialty Exam: Physical Exam  Vitals reviewed. Constitutional: She appears well-developed and well-nourished.   HENT:  Head: Normocephalic and atraumatic.  Eyes: Pupils are equal, round, and reactive to light. Conjunctivae are normal.  Neck: Normal range of motion.  Cardiovascular: Normal heart sounds.  Respiratory: Effort normal.  Patient is on mechanical ventilation through tracheostomy  GI: Soft.  Musculoskeletal: Normal range of motion.  Neurological: She is alert.  Skin: Skin is warm and dry.    Review of Systems  Unable to perform ROS: Intubated    Blood pressure (!) 123/59, pulse 80, temperature 99.6 F (37.6 C), temperature source Axillary, resp. rate 17, height _0  (1.448 m), weight 59.3 kg (130 lb 11.7 oz), last menstrual period 03/06/2005, SpO2 96 %.Body mass index is 28.29 kg/m.  General Appearance: Casual  Eye Contact:  Minimal  Speech:  Negative  Volume:  Decreased  Mood:  Negative  Affect:  Negative  Thought Process:  NA  Orientation:  Negative  Thought Content:  Negative  Suicidal Thoughts:  No  Homicidal Thoughts:  No  Memory:  Negative  Judgement:  Negative  Insight:  Negative  Psychomotor Activity:  Decreased  Concentration:  Concentration: Poor  Recall:  Poor  Fund of Knowledge:  Fair  Language:  Poor  Akathisia:  No  Handed:  Right  AIMS (if indicated):     Assets:  Desire for Improvement  ADL's:  Impaired  Cognition:  Impaired,  Moderate  Sleep:        Treatment Plan Summary: Medication management and Plan Patient continues to complain of anxiety.  I do not want to go overboard with sedating her but I will increase the clonazepam dose to 4 times a day would to be given with bedtime and meals.  I have also increased the Prozac to 20 mg twice a day for anxiety and depression.  We will follow as needed.  Disposition: No evidence of imminent risk to self or others at present.   Patient does not meet criteria for psychiatric inpatient admission.  Alethia Berthold, MD 10/31/2017 5:46 PM

## 2017-10-31 NOTE — Progress Notes (Signed)
   10/30/17 2049  Vent Select  Invasive or Noninvasive Invasive  Adult Vent Y  Tracheostomy Shiley 6 mm Cuffed  Placement Date/Time: 10/17/17 1140   Placed By: (c) Other (Comment)  Brand: Shiley  Size (mm): 6 mm  Style: Cuffed  Serial / Lot #: [id number]  Expiration Date: 12/25/21  Status Secured  Site Assessment Oozing secretions  Ties Assessment Clean;Dry;Secure  Cuff pressure (cm) 28 cm  Tracheostomy Equipment at bedside Yes and checklist posted at head of bed  Adult Ventilator Settings  Vent Type Servo i  Humidity HME  Vent Mode PRVC  Vt Set 450 mL  Set Rate 16 bmp  FiO2 (%) 24 %  I Time 0.9 Sec(s)  PEEP 5 cmH20  High PEEP 10 sec  Low PEEP 2 sec  Adult Ventilator Measurements  Peak Airway Pressure 22 L/min  Mean Airway Pressure 9 cmH20  Resp Rate Spontaneous 1 br/min  Resp Rate Total 17 br/min  Exhaled Vt 488 mL  Measured Ve 7.9 mL  I:E Ratio Measured 1:3  End Tidal CO2 39  Adult Ventilator Alarms  Alarms On Y  Ve High Alarm 20 L/min  Ve Low Alarm 2 L/min  Resp Rate High Alarm 40 br/min  Resp Rate Low Alarm 5  PEEP Low Alarm 2 cmH2O  Press High Alarm 50 cmH2O  VAP Prevention  HOB> 30 Degrees Y  Breath Sounds  Bilateral Breath Sounds Clear;Diminished  Airway Suctioning/Secretions  Suction Type Tracheal  Suction Device  Catheter  Secretion Amount Moderate  Secretion Color White;Yellow  Secretion Consistency Thick  Suction Tolerance Tolerated well  Suctioning Adverse Effects None

## 2017-10-31 NOTE — Progress Notes (Signed)
Name: Michelle [name] MRN: [id number] DOB: 04/09/1961     CONSULTATION DATE: 09/30/2017   Objective & objective: No major issues last night.  Less frequent panic attacks and noticeable improvement and mental status and being awake communicating by nodding and no need for deep sedation.  PAST MEDICAL HISTORY :   has a past medical history of Allergy, Anxiety, Asthma, CHF (congestive heart failure) ([place]), Cocaine abuse ([place]), COPD (chronic obstructive pulmonary disease) ([place]), Coronary artery disease, Emphysema/COPD ([place]), Hepatitis C, High cholesterol, Hypertension, Pneumothorax, and Tobacco abuse.  has a past surgical history that includes Cardiac catheterization; Chest tube insertion; Tracheostomy tube placement (N/A, 10/17/2017); and IR GASTROSTOMY TUBE MOD SED (10/31/2017). Prior to Admission medications   Medication Sig Start Date End Date Taking? Authorizing Provider  albuterol (PROVENTIL HFA;VENTOLIN HFA) 108 (90 Base) MCG/ACT inhaler Inhale 2 puffs into the lungs every 6 (six) hours as needed for wheezing or shortness of breath. 02/26/17  Yes Dustin Flock, MD  ALPRAZolam Duanne Moron) 0.5 MG tablet Take 1 tablet (0.5 mg total) by mouth 3 (three) times daily as needed for anxiety. 08/24/17  Yes Bettey Costa, MD  aspirin EC 81 MG tablet Take 81 mg by mouth daily.   Yes [provider]  ipratropium-albuterol (DUONEB) 0.5-2.5 (3) MG/3ML SOLN Take 3 mLs by nebulization every 6 (six) hours as needed. 01/04/17  Yes [name], MD  mirtazapine (REMERON) 30 MG tablet Take 30 mg by mouth at bedtime.   Yes [provider]  predniSONE (DELTASONE) 10 MG tablet Take 1 tablet (10 mg total) by mouth daily with breakfast. 08/24/17  Yes Mody, Sital, MD  SYMBICORT 160-4.5 MCG/ACT inhaler Take 2 puffs by mouth 2 (two) times daily.   Yes [provider]  tiotropium (SPIRIVA) 18 MCG inhalation capsule Place 1 capsule (18 mcg total) into inhaler and inhale daily. 01/04/17  Yes  [name], MD  carvedilol (COREG) 3.125 MG tablet Take 1 tablet (3.125 mg total) by mouth every morning. Patient not taking: Reported on 09/30/2017 02/10/16   Gladstone Lighter, MD  enalapril (VASOTEC) 10 MG tablet Take 0.5 tablets (5 mg total) by mouth daily. Patient not taking: Reported on 09/30/2017 02/10/16   Gladstone Lighter, MD  feeding supplement, ENSURE ENLIVE, (ENSURE ENLIVE) LIQD Take 237 mLs by mouth 2 (two) times daily between meals. 08/24/17   Bettey Costa, MD  guaiFENesin-codeine 100-10 MG/5ML syrup Take 10 mLs by mouth every 4 (four) hours as needed for cough. Patient not taking: Reported on 08/20/2017 07/02/17   Demetrios Loll, MD  levofloxacin (LEVAQUIN) 750 MG tablet Take 1 tablet (750 mg total) by mouth daily. Patient not taking: Reported on 09/30/2017 08/24/17   Bettey Costa, MD  polyethylene glycol (GOLYTELY) 236 g solution Drink one 8 oz glass every 20 mins until stools are clear starting at 5:00pm on 08/14/17 Patient not taking: Reported on 09/30/2017 07/26/17   Lucilla Lame, MD   Allergies  Allergen Reactions  . Carvedilol Other (See Comments)    Pt gets "sick" when on medication    FAMILY HISTORY:  family history includes Asthma in her mother; Breast cancer (age of onset: 77) in her maternal aunt; CAD in her father; COPD in her sister; CVA in her father; Lung cancer in her mother; Stroke in her father. SOCIAL HISTORY:  reports that she has been smoking cigarettes.  She has a 4.10 pack-year smoking history. She has never used smokeless tobacco. She reports that she has current or past drug history. Drug: "Crack" cocaine. She  reports that she does not drink alcohol.  REVIEW OF SYSTEMS:   Unable to obtain due to critical illness   VITAL SIGNS: Temp:  [98.1 F (36.7 C)-99.6 F (37.6 C)] 99.6 F (37.6 C) (05/30 1600) Pulse Rate:  [70-103] 79 (05/30 1600) Resp:  [13-25] 16 (05/30 1600) BP: (84-157)/(49-87) 100/63 (05/30 1600) SpO2:  [92 %-100 %] 96 % (05/30  1600) FiO2 (%):  [24 %] 24 % (05/30 1600) Weight:  [59.3 kg (130 lb 11.7 oz)] 59.3 kg (130 lb 11.7 oz) (05/30 0500)  Physical Examination:   Awake and following commands,less frequent episodes of panic withrespiratory distress. Moves all extremities with generalized weakness however does not cooperate with full neurological exam Trach in place, on vent, bilateral air entry is equal with no adventitious sounds while sedated however once awake she is panicking with severe respiratory distress and no air movements. S1 & S2 are audible with no murmur Benign abdomen was normal peristalsis Sacral decub stage II. With the extremities no leg edema  ASSESSMENT / PLAN:  Acute on chronic respiratory failure status post placement of tracheostomy on 10/17/2017 with ventilator dependency.  -Vent and trach weaning as tolerated.  COPD with frequent exacerbations and reactive airway disease. -Bronchial dilators + inhaled steroids  Anxiety and panic attacks (improved).As discussed with the family at the bedside that the patient has been having frequent and severe panic attacks for a long time and she was on Ativan.MRI of the brain 5/23 did not show any acute intracranial abnormalities. -Klonapin + Paxil  + Seroquel + Respirable+ Ativan PRN -Consider psych evaluation to optimize antipsychotics.   HTN.Echo in 2017 LVEF 55 to 60% -Optimize antihypertensives and monitor hemodynamics.  Questionable seizure activity.On Keppra  Sacral decub stage II -Wound care  Anemia -Keep hemoglobin more than 7 g/dL.  Hypokalemia -Replete and monitor electrolytes.  Deconditioning -Supportive care  Dysphagia awaiting placement of PEG for feedingi nam by IR  No DVT Rt U E as per venous Doppler.  Follow with case management for placement  Full code  DVT &GI prophylaxis. Continue with supportive care   Critical care time30 minutes

## 2017-10-31 NOTE — Plan of Care (Signed)
PEG placed today, pt may move out to [facility] tomorrow afternoon, tube feeds to be restarted tomorrow afternoon,  Anxiety markedly improved this shift, no changed to vent settings

## 2017-10-31 NOTE — Progress Notes (Signed)
[facility] [place] NAME: Michelle [name]    MR#:  [id number]  DATE OF BIRTH:  [birth year]  SUBJECTIVE:  CHIEF COMPLAINT:   Chief Complaint  Patient presents with  . Shortness of Breath  Case discussed with intensivist-for PEG tube placement later today, psychiatry input appreciated  REVIEW OF SYSTEMS:  CONSTITUTIONAL: No fever, fatigue or weakness.  EYES: No blurred or double vision.  EARS, NOSE, AND THROAT: No tinnitus or ear pain.  RESPIRATORY: No cough, shortness of breath, wheezing or hemoptysis.  CARDIOVASCULAR: No chest pain, orthopnea, edema.  GASTROINTESTINAL: No nausea, vomiting, diarrhea or abdominal pain.  GENITOURINARY: No dysuria, hematuria.  ENDOCRINE: No polyuria, nocturia,  HEMATOLOGY: No anemia, easy bruising or bleeding SKIN: No rash or lesion. MUSCULOSKELETAL: No joint pain or arthritis.   NEUROLOGIC: No tingling, numbness, weakness.  PSYCHIATRY: No anxiety or depression.   ROS  DRUG ALLERGIES:   Allergies  Allergen Reactions  . Carvedilol Other (See Comments)    Pt gets "sick" when on medication    VITALS:  Blood pressure 98/66, pulse 95, temperature 98.2 F (36.8 C), temperature source Axillary, resp. rate 17, height 4\' 9"  (1.448 m), weight 59.3 kg (130 lb 11.7 oz), last menstrual period 03/06/2005, SpO2 100 %.  PHYSICAL EXAMINATION:  GENERAL:  57 y.o.-year-old patient lying in the bed with no acute distress.  EYES: Pupils equal, round, reactive to light and accommodation. No scleral icterus. Extraocular muscles intact.  HEENT: Head atraumatic, normocephalic. Oropharynx and nasopharynx clear.  NECK:  Supple, no jugular venous distention. No thyroid enlargement, no tenderness.  LUNGS: Normal breath sounds bilaterally, no wheezing, rales,rhonchi or crepitation. No use of accessory muscles of respiration.  CARDIOVASCULAR: S1, S2 normal. No murmurs, rubs, or gallops.  ABDOMEN: Soft, nontender, nondistended. Bowel  sounds present. No organomegaly or mass.  EXTREMITIES: No pedal edema, cyanosis, or clubbing.  NEUROLOGIC: Cranial nerves II through XII are intact. Muscle strength 5/5 in all extremities. Sensation intact. Gait not checked.  PSYCHIATRIC: The patient is alert and oriented x 3.  SKIN: No obvious rash, lesion, or ulcer.   Physical Exam LABORATORY PANEL:   CBC Recent Labs  Lab 10/30/17 1422  WBC 6.6  HGB 8.2*  HCT 25.1*  PLT 234   ------------------------------------------------------------------------------------------------------------------  Chemistries  Recent Labs  Lab 10/31/17 0412  NA 138  K 3.4*  CL 104  CO2 27  GLUCOSE 90  BUN 17  CREATININE <0.30*  CALCIUM 7.9*  MG 1.7  AST 16  ALT 27  ALKPHOS 46  BILITOT 0.5   ------------------------------------------------------------------------------------------------------------------  Cardiac Enzymes No results for input(s): TROPONINI in the last 168 hours. ------------------------------------------------------------------------------------------------------------------  RADIOLOGY:  US Venous Img Upper Uni Right  Result Date: 10/30/2017 CLINICAL DATA:  Right upper extremity edema. History of right upper extremity approach PICC line. Evaluate for DVT. EXAM: RIGHT UPPER EXTREMITY VENOUS DOPPLER ULTRASOUND TECHNIQUE: Gray-scale sonography with graded compression, as well as color Doppler and duplex ultrasound were performed to evaluate the upper extremity deep venous system from the level of the subclavian vein and including the jugular, axillary, basilic, radial, ulnar and upper cephalic vein. Spectral Doppler was utilized to evaluate flow at rest and with distal augmentation maneuvers. COMPARISON:  None. FINDINGS: The mid aspect of the brachial veins was not imaged secondary to overlying bandage associated with right upper extremity approach PICC line. Contralateral Subclavian Vein: Respiratory phasicity is normal and  symmetric with the symptomatic side. No evidence of thrombus. Normal compressibility. Internal Jugular  Vein: No evidence of thrombus. Normal compressibility, respiratory phasicity and response to augmentation. Subclavian Vein: No evidence of thrombus. Normal compressibility, respiratory phasicity and response to augmentation. The mid aspect of the right upper extremity approach PICC line is seen within the subclavian vein without associated pericatheter thrombus (representative image 9). Axillary Vein: No evidence of thrombus. Normal compressibility, respiratory phasicity and response to augmentation. Cephalic Vein: No evidence of thrombus. Normal compressibility, respiratory phasicity and response to augmentation. Basilic Vein: No evidence of thrombus. Normal compressibility, respiratory phasicity and response to augmentation. Brachial Veins: No evidence of thrombus. Normal compressibility, respiratory phasicity and response to augmentation. Radial Veins: No evidence of thrombus. Normal compressibility, respiratory phasicity and response to augmentation. Ulnar Veins: No evidence of thrombus. Normal compressibility, respiratory phasicity and response to augmentation. Other Findings: There is a moderate amount of subcutaneous edema at the level of the upper arm and forearm. IMPRESSION: No evidence of DVT within the right upper extremity. Electronically Signed   By: Sandi Mariscal M.D.   On: 10/30/2017 09:47   Dg Chest Port 1 View  Result Date: 10/31/2017 CLINICAL DATA:  Atelectasis EXAM: PORTABLE CHEST 1 VIEW COMPARISON:  10/29/2017 FINDINGS: Support devices are stable. Heart is normal size. Mild hyperinflation of the lungs. No confluent opacities or effusions. No acute bony abnormality. IMPRESSION: Mild hyperinflation/COPD.  No active disease. Electronically Signed   By: Rolm Baptise M.D.   On: 10/31/2017 08:33    ASSESSMENT AND PLAN:  *Acute on chronic hypoxic respiratory failure  Stable s/p trach placement  Oct 17, 2017 Secondary to COPD exacerbation Difficult weaning off the vent, continue CPAP trials and weaning as tolerated  For PEG tube placement later today in discussion with intensivist  *Acute on COPD exacerbation Resolved Continue inhaled corticosteroids, BTs prn, supplemental oxygen/ventilator weaning as tolerated  *Chronic benign essential hypertension Controlled on current regiment  *Acute on chronic general anxiety disorder, unspecified Continue current psychotropic regiment  Psychiatry input greatly appreciated   *Acute dysphasia For PEG tube placement possibly later today   *Chronic stage II sacral decubitus wound Stable continue local wound care   Disposition to LTAC in 1 to 3 days barring any complications   All the records are reviewed and case discussed with Care Management/Social Workerr. Management plans discussed with the patient, family and they are in agreement.  CODE STATUS: partial  TOTAL TIME TAKING CARE OF THIS PATIENT: 35 minutes.     POSSIBLE D/C IN 1-3 DAYS, DEPENDING ON CLINICAL CONDITION.   Avel Peace Salary M.D on 10/31/2017   Between 7am to 6pm - Pager - [phone]  After 6pm go to [email or web address] - password [email or web address] [facility]  Office  [phone]  CC: Primary care physician; Center, [facility]  Note: This dictation was prepared with Dragon dictation along with smaller phrase technology. Any transcriptional errors that result from this process are unintentional.

## 2017-10-31 NOTE — Progress Notes (Signed)
Consult noted for "Anticoagulant/Antiplatelet PTA/Inpatient Med List Review by Pharmacist." No anticoagulants/antiplatelet orders held for procedure identified. Continue meds as ordered.  Tryone Kille A. [place], [place].D., BCPS Clinical Pharmacist 10/31/2017 15:17

## 2017-10-31 NOTE — Progress Notes (Signed)
   10/31/17 1455  Clinical Encounter Type  Visited With Patient not available  Visit Type Follow-up  Spiritual Encounters  Spiritual Needs Prayer   Chaplain attempted visit; patient not present.  Chaplain offered silent prayer for patient and care team.

## 2017-10-31 NOTE — Procedures (Signed)
Pre procedure Dx: Dysphagia Post Procedure Dx: Same  Successful fluoroscopic guided insertion of gastrostomy tube.   The gastrostomy tube may be used immediately for medications.   Tube feeds may be initiated in 24 hours as per the primary team.    EBL: Minimal  Complications: None immediate  Jay Jamien Casanova, MD Pager #: [phone]    

## 2017-10-31 NOTE — Progress Notes (Signed)
   10/31/17 0800  Tracheostomy Shiley 6 mm Cuffed  Placement Date/Time: 10/17/17 1140   Placed By: (c) Other (Comment)  Brand: Shiley  Size (mm): 6 mm  Style: Cuffed  Serial / Lot #: [id number]  Expiration Date: 12/25/21  Status Secured  Site Assessment Clean;Dry  Site Care Dressing applied;Cleansed  Tracheostomy Equipment at bedside Yes and checklist posted at head of bed  Adult Ventilator Settings  FiO2 (%) 24 %  Adult Ventilator Measurements  SpO2 94 %  Pre-WUA / WUA Start  Follows commands Yes  Synchrony Yes  Pre WUA sedation dose None -  WUA deferred  Breath Sounds  R Upper  Breath Sounds Clear  L Upper Breath Sounds Clear  R Lower Breath Sounds Clear  L Lower Breath Sounds Clear  Vent Respiratory Assessment  Level of Consciousness Responds to Voice  Respiratory Pattern Regular;Unlabored  Suction Method  Respiratory Interventions Oral suction  Oral Suctioning/Secretions  Suction Type Oral  Subglottic suctioning (mmhg) 120 mmhg  Suction Device [place]  Secretion Amount Small  Secretion Color Clear  Secretion Consistency Thin  Suction Tolerance Tolerated well  Suctioning Adverse Effects None  Airway Suctioning/Secretions  Suction Type Tracheal  Suction Device  Catheter  [place] Agitation Sedation Scale  [place] Agitation Sedation Scale (RASS) 0  RASS Goal 0

## 2017-11-01 LAB — BASIC METABOLIC PANEL
Anion gap: 7 (ref 5–15)
BUN: 14 mg/dL (ref 6–20)
CHLORIDE: 103 mmol/L (ref 101–111)
CO2: 26 mmol/L (ref 22–32)
Calcium: 7.9 mg/dL — ABNORMAL LOW (ref 8.9–10.3)
Glucose, Bld: 141 mg/dL — ABNORMAL HIGH (ref 65–99)
POTASSIUM: 3.6 mmol/L (ref 3.5–5.1)
SODIUM: 136 mmol/L (ref 135–145)

## 2017-11-01 LAB — URINALYSIS, COMPLETE (UACMP) WITH MICROSCOPIC
BILIRUBIN URINE: NEGATIVE
Bacteria, UA: NONE SEEN
GLUCOSE, UA: NEGATIVE mg/dL
HGB URINE DIPSTICK: NEGATIVE
KETONES UR: 20 mg/dL — AB
Nitrite: NEGATIVE
Protein, ur: NEGATIVE mg/dL
Specific Gravity, Urine: 1.017 (ref 1.005–1.030)
pH: 6 (ref 5.0–8.0)

## 2017-11-01 LAB — CBC WITH DIFFERENTIAL/PLATELET
BASOS ABS: 0 10*3/uL (ref 0–0.1)
Basophils Relative: 0 %
EOS ABS: 0 10*3/uL (ref 0–0.7)
EOS PCT: 0 %
HCT: 21.6 % — ABNORMAL LOW (ref 35.0–47.0)
Hemoglobin: 7.2 g/dL — ABNORMAL LOW (ref 12.0–16.0)
Lymphocytes Relative: 7 %
Lymphs Abs: 0.3 10*3/uL — ABNORMAL LOW (ref 1.0–3.6)
MCH: 30.2 pg (ref 26.0–34.0)
MCHC: 33.2 g/dL (ref 32.0–36.0)
MCV: 91 fL (ref 80.0–100.0)
MONO ABS: 0.2 10*3/uL (ref 0.2–0.9)
Monocytes Relative: 5 %
Neutro Abs: 4.2 10*3/uL (ref 1.4–6.5)
Neutrophils Relative %: 88 %
PLATELETS: 187 10*3/uL (ref 150–440)
RBC: 2.38 MIL/uL — AB (ref 3.80–5.20)
RDW: 16.5 % — AB (ref 11.5–14.5)
WBC: 4.7 10*3/uL (ref 3.6–11.0)

## 2017-11-01 LAB — GLUCOSE, CAPILLARY
GLUCOSE-CAPILLARY: 111 mg/dL — AB (ref 65–99)
GLUCOSE-CAPILLARY: 127 mg/dL — AB (ref 65–99)
GLUCOSE-CAPILLARY: 144 mg/dL — AB (ref 65–99)
GLUCOSE-CAPILLARY: 62 mg/dL — AB (ref 65–99)
GLUCOSE-CAPILLARY: 76 mg/dL (ref 65–99)
Glucose-Capillary: 69 mg/dL (ref 65–99)
Glucose-Capillary: 124 mg/dL — ABNORMAL HIGH (ref 65–99)
Glucose-Capillary: 120 mg/dL — ABNORMAL HIGH (ref 65–99)

## 2017-11-01 LAB — LACTIC ACID, PLASMA: LACTIC ACID, VENOUS: 1.2 mmol/L (ref 0.5–1.9)

## 2017-11-01 LAB — CALCIUM, IONIZED: CALCIUM, IONIZED, SERUM: 5.1 mg/dL (ref 4.5–5.6)

## 2017-11-01 LAB — MAGNESIUM: Magnesium: 1.8 mg/dL (ref 1.7–2.4)

## 2017-11-01 LAB — PHOSPHORUS: Phosphorus: 3.5 mg/dL (ref 2.5–4.6)

## 2017-11-01 MED ORDER — DEXTROSE-NACL 5-0.9 % IV SOLN
INTRAVENOUS | Status: DC
  Administered 2017-11-01: 13:00:00 via INTRAVENOUS

## 2017-11-01 MED ORDER — SODIUM CHLORIDE 0.9 % IV SOLN
INTRAVENOUS | Status: AC
  Administered 2017-11-01: 13:00:00 via INTRAVENOUS

## 2017-11-01 MED ORDER — POTASSIUM CHLORIDE 10 MEQ/100ML IV SOLN
10.0000 meq | INTRAVENOUS | Status: AC
  Administered 2017-11-01 (×3): 10 meq via INTRAVENOUS
  Filled 2017-11-01 (×3): qty 100

## 2017-11-01 MED ORDER — SODIUM CHLORIDE 0.9 % IV SOLN
1.0000 g | Freq: Every day | INTRAVENOUS | Status: DC
  Administered 2017-11-01 – 2017-11-03 (×3): 1 g via INTRAVENOUS
  Filled 2017-11-01: qty 1
  Filled 2017-11-01: qty 10
  Filled 2017-11-01 (×2): qty 1

## 2017-11-01 MED ORDER — DEXTROSE 50 % IV SOLN
INTRAVENOUS | Status: AC
  Administered 2017-11-01: 50 mL via INTRAVENOUS
  Filled 2017-11-01: qty 50

## 2017-11-01 MED ORDER — DEXTROSE 50 % IV SOLN
50.0000 mL | Freq: Once | INTRAVENOUS | Status: AC
  Administered 2017-11-01: 50 mL via INTRAVENOUS

## 2017-11-01 MED ORDER — LORAZEPAM 2 MG/ML IJ SOLN
1.0000 mg | INTRAMUSCULAR | Status: DC | PRN
  Administered 2017-11-01 – 2017-11-04 (×6): 1 mg via INTRAVENOUS
  Filled 2017-11-01 (×8): qty 1

## 2017-11-01 MED ORDER — LORAZEPAM 2 MG/ML IJ SOLN: 2.0000 mg | INTRAMUSCULAR | Status: DC | PRN

## 2017-11-01 MED ORDER — ALBUMIN HUMAN 25 % IV SOLN
25.0000 g | Freq: Once | INTRAVENOUS | Status: AC
  Administered 2017-11-01: 25 g via INTRAVENOUS
  Filled 2017-11-01: qty 100

## 2017-11-01 MED ORDER — CLONAZEPAM 0.5 MG PO TABS
0.5000 mg | ORAL_TABLET | Freq: Four times a day (QID) | ORAL | Status: DC
  Administered 2017-11-01 – 2017-11-02 (×6): 0.5 mg via ORAL
  Filled 2017-11-01 (×7): qty 1

## 2017-11-01 NOTE — Progress Notes (Signed)
[facility] at [place] NAME: Michelle [name]    MR#:  [id number]  DATE OF BIRTH:  Oct 29, 1960  SUBJECTIVE:  CHIEF COMPLAINT:   Chief Complaint  Patient presents with  . Shortness of Breath  In discussion with intensivist, for LTAC placement later today  REVIEW OF SYSTEMS:  CONSTITUTIONAL: No fever, fatigue or weakness.  EYES: No blurred or double vision.  EARS, NOSE, AND THROAT: No tinnitus or ear pain.  RESPIRATORY: No cough, shortness of breath, wheezing or hemoptysis.  CARDIOVASCULAR: No chest pain, orthopnea, edema.  GASTROINTESTINAL: No nausea, vomiting, diarrhea or abdominal pain.  GENITOURINARY: No dysuria, hematuria.  ENDOCRINE: No polyuria, nocturia,  HEMATOLOGY: No anemia, easy bruising or bleeding SKIN: No rash or lesion. MUSCULOSKELETAL: No joint pain or arthritis.   NEUROLOGIC: No tingling, numbness, weakness.  PSYCHIATRY: No anxiety or depression.   ROS  DRUG ALLERGIES:   Allergies  Allergen Reactions  . Carvedilol Other (See Comments)    Pt gets "sick" when on medication    VITALS:  Blood pressure (!) 84/70, pulse 77, temperature 98.4 F (36.9 C), temperature source Oral, resp. rate 16, height 4\' 9"  (1.448 m), weight 59.4 kg (130 lb 15.3 oz), last menstrual period 03/06/2005, SpO2 93 %.  PHYSICAL EXAMINATION:  GENERAL:  57 y.o.-year-old patient lying in the bed with no acute distress.  EYES: Pupils equal, round, reactive to light and accommodation. No scleral icterus. Extraocular muscles intact.  HEENT: Head atraumatic, normocephalic. Oropharynx and nasopharynx clear.  NECK:  Supple, no jugular venous distention. No thyroid enlargement, no tenderness.  LUNGS: Normal breath sounds bilaterally, no wheezing, rales,rhonchi or crepitation. No use of accessory muscles of respiration.  CARDIOVASCULAR: S1, S2 normal. No murmurs, rubs, or gallops.  ABDOMEN: Soft, nontender, nondistended. Bowel sounds present. No organomegaly or  mass.  EXTREMITIES: No pedal edema, cyanosis, or clubbing.  NEUROLOGIC: Cranial nerves II through XII are intact. Muscle strength 5/5 in all extremities. Sensation intact. Gait not checked.  PSYCHIATRIC: The patient is alert and oriented x 3.  SKIN: No obvious rash, lesion, or ulcer.   Physical Exam LABORATORY PANEL:   CBC Recent Labs  Lab 11/01/17 0548  WBC 4.7  HGB 7.2*  HCT 21.6*  PLT 187   ------------------------------------------------------------------------------------------------------------------  Chemistries  Recent Labs  Lab 10/31/17 0412  NA 138  K 3.4*  CL 104  CO2 27  GLUCOSE 90  BUN 17  CREATININE <0.30*  CALCIUM 7.9*  MG 1.7  AST 16  ALT 27  ALKPHOS 46  BILITOT 0.5   ------------------------------------------------------------------------------------------------------------------  Cardiac Enzymes No results for input(s): TROPONINI in the last 168 hours. ------------------------------------------------------------------------------------------------------------------  RADIOLOGY:  [place] Intraoperative  Result Date: 10/31/2017 CLINICAL DATA:  Ultrasound was provided for use by the ordering physician, and a technical charge was applied by the performing facility.  No radiologist interpretation/professional services rendered.   Ir Gastrostomy Tube Mod Sed  Result Date: 10/31/2017 INDICATION: Dysphagia. Please perform percutaneous gastrostomy tube placement for enteric nutrition supplementation purposes. EXAM: PULL TROUGH GASTROSTOMY TUBE PLACEMENT COMPARISON:  Chest CT - 08/23/2017 MEDICATIONS: Ancef 2 gm IV; Antibiotics were administered within 1 hour of the procedure. Glucagon 1 mg IV CONTRAST:  20 cc Isovue 300 administered into the gastric lumen. ANESTHESIA/SEDATION: Moderate (conscious) sedation was employed during this procedure. A total of Versed 2 mg and Fentanyl 100 mcg was administered intravenously. Moderate Sedation Time: 12 minutes. The  patient's level of consciousness and vital signs were monitored continuously by radiology nursing throughout  the procedure under my direct supervision. FLUOROSCOPY TIME:  1 minutes 24 seconds (38.1 mGy) COMPLICATIONS: None immediate. PROCEDURE: Informed written consent was obtained from the patient's family following explanation of the procedure, risks, benefits and alternatives. A time out was performed prior to the initiation of the procedure. Ultrasound scanning was performed to demarcate the edge of the left lobe of the liver. Maximal barrier sterile technique utilized including caps, mask, sterile gowns, sterile gloves, large sterile drape, hand hygiene and Betadine prep. The left upper quadrant was sterilely prepped and draped. An oral gastric catheter was inserted into the stomach under fluoroscopy. The existing nasogastric feeding tube was removed. The left costal margin and air opacified transverse colon were identified and avoided. Air was injected into the stomach for insufflation and visualization under fluoroscopy. Under sterile conditions a 17 gauge trocar needle was utilized to access the stomach percutaneously beneath the left subcostal margin after the overlying soft tissues were anesthetized with 1% Lidocaine with epinephrine. Needle position was confirmed within the stomach with aspiration of air and injection of small amount of contrast. A single T tack was deployed for gastropexy. Over an Amplatz guide wire, a 9-French sheath was inserted into the stomach. A snare device was utilized to capture the oral gastric catheter. The snare device was pulled retrograde from the stomach up the esophagus and out the oropharynx. The 20-French pull-through gastrostomy was connected to the snare device and pulled antegrade through the oropharynx down the esophagus into the stomach and then through the percutaneous tract external to the patient. The gastrostomy was assembled externally. Contrast injection  confirms position in the stomach. Several spot radiographic images were obtained in various obliquities for documentation. The patient tolerated procedure well without immediate post procedural complication. FINDINGS: After successful fluoroscopic guided placement, the gastrostomy tube is appropriately positioned with internal disc against the ventral aspect of the gastric lumen. IMPRESSION: Successful fluoroscopic insertion of a 20-French pull-through gastrostomy tube. The gastrostomy may be used immediately for medication administration and in 24 hrs for the initiation of feeds. Electronically Signed   By: Sandi Mariscal M.D.   On: 10/31/2017 15:20   Dg Chest Port 1 View  Result Date: 10/31/2017 CLINICAL DATA:  Atelectasis EXAM: PORTABLE CHEST 1 VIEW COMPARISON:  10/29/2017 FINDINGS: Support devices are stable. Heart is normal size. Mild hyperinflation of the lungs. No confluent opacities or effusions. No acute bony abnormality. IMPRESSION: Mild hyperinflation/COPD.  No active disease. Electronically Signed   By: Rolm Baptise M.D.   On: 10/31/2017 08:33    ASSESSMENT AND PLAN:  *Acute on chronic hypoxic respiratory failure  Stable s/p trach placement Oct 17, 2017 Secondary to COPD exacerbation Difficult weaning off the vent, continue CPAP trials and weaning as tolerated For PEG tube placement later today in discussion with intensivist  *Acute on COPD exacerbation Resolved Continue inhaled corticosteroids, BTs prn, supplemental oxygen/ventilator weaning as tolerated  *Chronic benign essential hypertension Controlled on current regiment  *Acute on chronic general anxiety disorder, unspecified Continue current psychotropic regiment  Psychiatry input greatly appreciated   *Acute dysphasia For PEG tube placement possibly later today   *Chronic stage II sacral decubitus wound Stable continue local wound care  Disposition to LTAC later today  All the records are reviewed and case  discussed with Care Management/Social Workerr. Management plans discussed with the patient, family and they are in agreement.  CODE STATUS: partial  TOTAL TIME TAKING CARE OF THIS PATIENT: 35 minutes.     POSSIBLE D/C IN 35 DAYS,  DEPENDING ON CLINICAL CONDITION.   Avel Peace Allesandra Huebsch M.D on 11/01/2017   Between 7am to 6pm - Pager - [phone]  After 6pm go to [email or web address] - password [email or web address] [facility]  Office  [phone]  CC: Primary care physician; Center, [facility]  Note: This dictation was prepared with Dragon dictation along with smaller phrase technology. Any transcriptional errors that result from this process are unintentional.

## 2017-11-01 NOTE — Progress Notes (Signed)
Pt tolerated SBT briefly this shift, approx 30 minutes - 1 hour.  Became very panicky during trach care, tachypneic, HR and BP elevated and O2 sats dropped to 70s, administered ativan and gave 100% O2, tried to calm her verbally, but patient could not recover.  Restarted vent rate with 40% FIO2, has been reduced now to 35% by RT.

## 2017-11-01 NOTE — Progress Notes (Addendum)
Name: LUCIANN GOSSETT MRN: [id number] DOB: [birth year]     CONSULTATION DATE: 09/30/2017  Subjective & Objective: PEG was placed on 10/31/2017. Hypoglycemia.  PAST MEDICAL HISTORY :   has a past medical history of Allergy, Anxiety, Asthma, CHF (congestive heart failure) ([place]), Cocaine abuse ([place]), COPD (chronic obstructive pulmonary disease) ([place]), Coronary artery disease, Emphysema/COPD ([place]), Hepatitis C, High cholesterol, Hypertension, Pneumothorax, and Tobacco abuse.  has a past surgical history that includes Cardiac catheterization; Chest tube insertion; Tracheostomy tube placement (N/A, 10/17/2017); and IR GASTROSTOMY TUBE MOD SED (10/31/2017). Prior to Admission medications   Medication Sig Start Date End Date Taking? Authorizing Provider  albuterol (PROVENTIL HFA;VENTOLIN HFA) 108 (90 Base) MCG/ACT inhaler Inhale 2 puffs into the lungs every 6 (six) hours as needed for wheezing or shortness of breath. 02/26/17  Yes Dustin Flock, MD  ALPRAZolam Duanne Moron) 0.5 MG tablet Take 1 tablet (0.5 mg total) by mouth 3 (three) times daily as needed for anxiety. 08/24/17  Yes Bettey Costa, MD  aspirin EC 81 MG tablet Take 81 mg by mouth daily.   Yes [provider]  ipratropium-albuterol (DUONEB) 0.5-2.5 (3) MG/3ML SOLN Take 3 mLs by nebulization every 6 (six) hours as needed. 01/04/17  Yes [name], MD  mirtazapine (REMERON) 30 MG tablet Take 30 mg by mouth at bedtime.   Yes [provider]  predniSONE (DELTASONE) 10 MG tablet Take 1 tablet (10 mg total) by mouth daily with breakfast. 08/24/17  Yes Mody, Sital, MD  SYMBICORT 160-4.5 MCG/ACT inhaler Take 2 puffs by mouth 2 (two) times daily.   Yes [provider]  tiotropium (SPIRIVA) 18 MCG inhalation capsule Place 1 capsule (18 mcg total) into inhaler and inhale daily. 01/04/17  Yes [name], MD  carvedilol (COREG) 3.125 MG tablet Take 1 tablet (3.125 mg total) by mouth every morning. Patient not taking:  Reported on 09/30/2017 02/10/16   Gladstone Lighter, MD  enalapril (VASOTEC) 10 MG tablet Take 0.5 tablets (5 mg total) by mouth daily. Patient not taking: Reported on 09/30/2017 02/10/16   Gladstone Lighter, MD  feeding supplement, ENSURE ENLIVE, (ENSURE ENLIVE) LIQD Take 237 mLs by mouth 2 (two) times daily between meals. 08/24/17   Bettey Costa, MD  guaiFENesin-codeine 100-10 MG/5ML syrup Take 10 mLs by mouth every 4 (four) hours as needed for cough. Patient not taking: Reported on 08/20/2017 07/02/17   Demetrios Loll, MD  levofloxacin (LEVAQUIN) 750 MG tablet Take 1 tablet (750 mg total) by mouth daily. Patient not taking: Reported on 09/30/2017 08/24/17   Bettey Costa, MD  polyethylene glycol (GOLYTELY) 236 g solution Drink one 8 oz glass every 20 mins until stools are clear starting at 5:00pm on 08/14/17 Patient not taking: Reported on 09/30/2017 07/26/17   Lucilla Lame, MD   Allergies  Allergen Reactions  . Carvedilol Other (See Comments)    Pt gets "sick" when on medication    FAMILY HISTORY:  family history includes Asthma in her mother; Breast cancer (age of onset: 26) in her maternal aunt; CAD in her father; COPD in her sister; CVA in her father; Lung cancer in her mother; Stroke in her father. SOCIAL HISTORY:  reports that she has been smoking cigarettes.  She has a 4.10 pack-year smoking history. She has never used smokeless tobacco. She reports that she has current or past drug history. Drug: "Crack" cocaine. She reports that she does not drink alcohol.  REVIEW OF SYSTEMS:   Unable to obtain due to critical illness   VITAL  SIGNS: Temp:  [98.4 F (36.9 C)-99.6 F (37.6 C)] 98.4 F (36.9 C) (05/31 0800) Pulse Rate:  [73-95] 77 (05/31 1000) Resp:  [16-20] 16 (05/31 1000) BP: (84-123)/(49-78) 84/70 (05/31 1000) SpO2:  [91 %-100 %] 93 % (05/31 1201) FiO2 (%):  [24 %] 24 % (05/31 1201) Weight:  [59.4 kg (130 lb 15.3 oz)] 59.4 kg (130 lb 15.3 oz) (05/31 0500)  Physical Examination:    Awake and following commands. No focal motor deficits. Trach in place, on vent, bilateral air entry is equal with no adventitious sound. S1 & S2 are audible with no murmur Benign abdomen was normal peristalsis Sacral decub stage II. With the extremities no leg edema   ASSESSMENT / PLAN:  Acute on chronic respiratory failure status post placement of tracheostomy on 5/16/2019with ventilator dependency.  -Vent and trach weaning as tolerated.  Hypotension with intravascular volume depletion. / sepesis -Optimize volume and monitor hemodynamics. -Empiric Rocephin, follow with blood culture and Lactic acid level.  COPD with frequent exacerbations and reactive airway disease. -Bronchial dilators + inhaled steroids  Anxiety and panic attacks (improved).MRI of the brain 5/23 did not show any acute intracranial abnormalities. -Klonapin + Paxil + Seroquel + Respirable+ Ativan PRN -Consider psych evaluation to optimize antipsychotics.   HTN.Echo in 2017 LVEF 55 to 60% -Optimize antihypertensives and monitor hemodynamics.  Questionable seizure activity.On Keppra  Sacral decub stage II -Wound care  Anemia -Keep hemoglobin more than 7 g/dL.  Hypokalemia -Replete and monitor electrolytes.  Hypoglycemia.  -Dextrose solution and monitor BG. Awaiting starting TF.  Deconditioning -Supportive care  Dysphagia s/p Placement of PEG on 10/31/2017  Hypokalemia -Replete and monitor electrolytes.  No DVT Rt U E as per venous Doppler.  Follow with case management for placement  Full code  DVT &GI prophylaxis. Continue with supportive care   Critical care time30 minutes

## 2017-11-02 ENCOUNTER — Inpatient Hospital Stay: Payer: Medicaid Other

## 2017-11-02 LAB — CBC WITH DIFFERENTIAL/PLATELET
BASOS ABS: 0 10*3/uL (ref 0–0.1)
Basophils Relative: 0 %
Eosinophils Absolute: 0 10*3/uL (ref 0–0.7)
Eosinophils Relative: 0 %
HEMATOCRIT: 21.5 % — AB (ref 35.0–47.0)
HEMOGLOBIN: 6.9 g/dL — AB (ref 12.0–16.0)
LYMPHS PCT: 6 %
Lymphs Abs: 0.3 10*3/uL — ABNORMAL LOW (ref 1.0–3.6)
MCH: 29.1 pg (ref 26.0–34.0)
MCHC: 32.2 g/dL (ref 32.0–36.0)
MCV: 90.2 fL (ref 80.0–100.0)
MONO ABS: 0.3 10*3/uL (ref 0.2–0.9)
Monocytes Relative: 6 %
NEUTROS ABS: 3.9 10*3/uL (ref 1.4–6.5)
NEUTROS PCT: 88 %
Platelets: 179 10*3/uL (ref 150–440)
RBC: 2.38 MIL/uL — ABNORMAL LOW (ref 3.80–5.20)
RDW: 16.3 % — AB (ref 11.5–14.5)
WBC: 4.4 10*3/uL (ref 3.6–11.0)

## 2017-11-02 LAB — GLUCOSE, CAPILLARY
GLUCOSE-CAPILLARY: 150 mg/dL — AB (ref 65–99)
GLUCOSE-CAPILLARY: 126 mg/dL — AB (ref 65–99)
GLUCOSE-CAPILLARY: 151 mg/dL — AB (ref 65–99)
GLUCOSE-CAPILLARY: 143 mg/dL — AB (ref 65–99)
Glucose-Capillary: 124 mg/dL — ABNORMAL HIGH (ref 65–99)
Glucose-Capillary: 127 mg/dL — ABNORMAL HIGH (ref 65–99)
Glucose-Capillary: 174 mg/dL — ABNORMAL HIGH (ref 65–99)

## 2017-11-02 LAB — BASIC METABOLIC PANEL
ANION GAP: 4 — AB (ref 5–15)
BUN: 12 mg/dL (ref 6–20)
CHLORIDE: 104 mmol/L (ref 101–111)
CO2: 27 mmol/L (ref 22–32)
Calcium: 8.1 mg/dL — ABNORMAL LOW (ref 8.9–10.3)
Creatinine, Ser: <0.3 mg/dL — ABNORMAL LOW (ref 0.44–1.00)
GLUCOSE: 138 mg/dL — AB (ref 65–99)
POTASSIUM: 3.6 mmol/L (ref 3.5–5.1)
Sodium: 135 mmol/L (ref 135–145)

## 2017-11-02 LAB — MAGNESIUM: Magnesium: 1.7 mg/dL (ref 1.7–2.4)

## 2017-11-02 LAB — URINE CULTURE

## 2017-11-02 LAB — PREPARE RBC (CROSSMATCH)

## 2017-11-02 LAB — PHOSPHORUS: PHOSPHORUS: 2.2 mg/dL — AB (ref 2.5–4.6)

## 2017-11-02 LAB — ABO/RH: ABO/RH(D): AB POS

## 2017-11-02 MED ORDER — SODIUM CHLORIDE 0.9 % IV SOLN: Freq: Once | INTRAVENOUS | Status: DC

## 2017-11-02 MED ORDER — MIDAZOLAM HCL 2 MG/2ML IJ SOLN
2.0000 mg | INTRAMUSCULAR | Status: AC
  Administered 2017-11-02: 2 mg via INTRAVENOUS
  Filled 2017-11-02: qty 2

## 2017-11-02 MED ORDER — FENTANYL CITRATE (PF) 100 MCG/2ML IJ SOLN
INTRAMUSCULAR | Status: AC
  Filled 2017-11-02: qty 2

## 2017-11-02 MED ORDER — POTASSIUM PHOSPHATE MONOBASIC 500 MG PO TABS
500.0000 mg | ORAL_TABLET | Freq: Three times a day (TID) | ORAL | Status: AC
  Administered 2017-11-02: 500 mg via ORAL
  Filled 2017-11-02: qty 1

## 2017-11-02 MED ORDER — FENTANYL CITRATE (PF) 100 MCG/2ML IJ SOLN
100.0000 ug | Freq: Once | INTRAMUSCULAR | Status: AC
  Administered 2017-11-02: 100 ug via INTRAVENOUS

## 2017-11-02 NOTE — Progress Notes (Signed)
1 unit prbc administered. Started at 1745 and ended at 2113 pm. No s/s of adverse reactions. Patients heart rate, blood pressure and respirations were leevated by NP states this happens occasionally with patient. Metoprolol, hydralazine, ativan, morphine and versed were administered to patient.

## 2017-11-02 NOTE — Progress Notes (Signed)
PT Cancellation Note  Patient Details Name: Michelle [name] MRN: [id number] DOB: 10-14-1960   Cancelled Treatment:    Reason Eval/Treat Not Completed: Medical issues which prohibited therapy   Chart reviewed.  HgB 6.9 and trending down.  HCT 21.5.  Per PT protocols, will hold session at this time and continue as appropriate.   Chesley Noon 11/02/2017, 8:29 AM

## 2017-11-02 NOTE — Progress Notes (Addendum)
[facility] at [place] NAME: Michelle [name]    MR#:  [id number]  DATE OF BIRTH:  12/23/1960  SUBJECTIVE:  CHIEF COMPLAINT:   Chief Complaint  Patient presents with  . Shortness of Breath   Remains on the vent did not tolerate CPAP Pap pressure support  REVIEW OF SYSTEMS:  CONSTITUTIONAL: Unable to communicate  ROS  DRUG ALLERGIES:   Allergies  Allergen Reactions  . Carvedilol Other (See Comments)    Pt gets "sick" when on medication    VITALS:  Blood pressure 108/75, pulse (!) 108, temperature 99 F (37.2 C), temperature source Axillary, resp. rate (!) 27, height 4\' 9"  (1.448 m), weight 60.2 kg (132 lb 11.5 oz), last menstrual period 03/06/2005, SpO2 99 %.  PHYSICAL EXAMINATION:  GENERAL:  57 y.o.-year-old patient lying in the bed with no acute distress.  EYES: Pupils equal, round, reactive to light and accommodation. No scleral icterus. Extraocular muscles intact.  HEENT: Head atraumatic, normocephalic. Oropharynx and nasopharynx clear.  NECK:  Supple, no jugular venous distention. No thyroid enlargement, no tenderness.  LUNGS: Normal breath sounds bilaterally, no wheezing, rales,rhonchi or crepitation. No use of accessory muscles of respiration.  CARDIOVASCULAR: S1, S2 normal. No murmurs, rubs, or gallops.  ABDOMEN: Soft, nontender, nondistended. Bowel sounds present. No organomegaly or mass.  EXTREMITIES: No pedal edema, cyanosis, or clubbing.  NEUROLOGIC: Cranial nerves II through XII are intact. Muscle strength 5/5 in all extremities. Sensation intact. Gait not checked.  PSYCHIATRIC: The patient is alert and oriented x 3.  SKIN: No obvious rash, lesion, or ulcer.   Physical Exam LABORATORY PANEL:   CBC Recent Labs  Lab 11/02/17 0502  WBC 4.4  HGB 6.9*  HCT 21.5*  PLT 179   ------------------------------------------------------------------------------------------------------------------  Chemistries  Recent Labs  Lab  10/31/17 0412  11/02/17 0502  NA 138   < > 135  K 3.4*   < > 3.6  CL 104   < > 104  CO2 27   < > 27  GLUCOSE 90   < > 138*  BUN 17   < > 12  CREATININE <0.30*   < > <0.30*  CALCIUM 7.9*   < > 8.1*  MG 1.7   < > 1.7  AST 16  --   --   ALT 27  --   --   ALKPHOS 46  --   --   BILITOT 0.5  --   --    < > = values in this interval not displayed.   ------------------------------------------------------------------------------------------------------------------  Cardiac Enzymes No results for input(s): TROPONINI in the last 168 hours. ------------------------------------------------------------------------------------------------------------------  RADIOLOGY:  Dg Chest Port 1 View  Result Date: 11/02/2017 CLINICAL DATA:  Followup pneumonia. EXAM: PORTABLE CHEST 1 VIEW COMPARISON:  10/31/2017 and older exams. FINDINGS: Lungs are hyperexpanded with prominent bronchovascular markings consistent with COPD. Mild linear scarring noted in the left mid lung is stable. No evidence of pneumonia or pulmonary edema. Tracheostomy tube and right PICC are stable and well positioned. The nasal/orogastric tube has been removed since the prior exam. IMPRESSION: 1. No acute findings.  No evidence of pneumonia or pulmonary edema. 2. COPD. 3. Well-positioned tracheostomy tube and right PICC. Electronically Signed   By: Lajean Manes M.D.   On: 11/02/2017 07:15    ASSESSMENT AND PLAN:  *Acute on chronic hypoxic respiratory failure  Stable s/p trach placement Oct 17, 2017 Secondary to COPD exacerbation Difficult weaning off the vent, continue CPAP trials  and weaning as tolerated   *Acute on COPD exacerbation Resolved Continue inhaled corticosteroids, BTs prn, supplemental oxygen/ventilator weaning as tolerated  *Chronic benign essential hypertension Controlled on current regiment  *Acute on chronic general anxiety disorder, unspecified Continue current psychotropic regiment  Psychiatry input  greatly appreciated    *Chronic stage II sacral decubitus wound Stable continue local wound care   All the records are reviewed and case discussed with Care Management/Social Workerr. Management plans discussed with the patient, family and they are in agreement.  CODE STATUS: partial  TOTAL TIME TAKING CARE OF THIS PATIENT: 35 minutes.        Dustin Flock M.D on 11/02/2017   Between 7am to 6pm - Pager - [phone]  After 6pm go to [email or web address] - password [email or web address] [facility]  Office  [phone]  CC: Primary care physician; Center, [facility]  Note: This dictation was prepared with Dragon dictation along with smaller phrase technology. Any transcriptional errors that result from this process are unintentional.

## 2017-11-02 NOTE — Progress Notes (Addendum)
Name: Michelle [name] MRN: [id number] DOB: Aug 09, 1960     CONSULTATION DATE: 09/30/2017  Subjective and objective: Remains on vent, less frequent episodes of panics and did not tolerate CPAP pressure support appliance. PAST MEDICAL HISTORY :   has a past medical history of Allergy, Anxiety, Asthma, CHF (congestive heart failure) ([place]), Cocaine abuse ([place]), COPD (chronic obstructive pulmonary disease) ([place]), Coronary artery disease, Emphysema/COPD ([place]), Hepatitis C, High cholesterol, Hypertension, Pneumothorax, and Tobacco abuse.  has a past surgical history that includes Cardiac catheterization; Chest tube insertion; Tracheostomy tube placement (N/A, 10/17/2017); and IR GASTROSTOMY TUBE MOD SED (10/31/2017). Prior to Admission medications   Medication Sig Start Date End Date Taking? Authorizing Provider  albuterol (PROVENTIL HFA;VENTOLIN HFA) 108 (90 Base) MCG/ACT inhaler Inhale 2 puffs into the lungs every 6 (six) hours as needed for wheezing or shortness of breath. 02/26/17  Yes Dustin Flock, MD  ALPRAZolam Duanne Moron) 0.5 MG tablet Take 1 tablet (0.5 mg total) by mouth 3 (three) times daily as needed for anxiety. 08/24/17  Yes Bettey Costa, MD  aspirin EC 81 MG tablet Take 81 mg by mouth daily.   Yes [provider]  ipratropium-albuterol (DUONEB) 0.5-2.5 (3) MG/3ML SOLN Take 3 mLs by nebulization every 6 (six) hours as needed. 01/04/17  Yes [name], MD  mirtazapine (REMERON) 30 MG tablet Take 30 mg by mouth at bedtime.   Yes [provider]  predniSONE (DELTASONE) 10 MG tablet Take 1 tablet (10 mg total) by mouth daily with breakfast. 08/24/17  Yes Mody, Sital, MD  SYMBICORT 160-4.5 MCG/ACT inhaler Take 2 puffs by mouth 2 (two) times daily.   Yes [provider]  tiotropium (SPIRIVA) 18 MCG inhalation capsule Place 1 capsule (18 mcg total) into inhaler and inhale daily. 01/04/17  Yes [name], MD  carvedilol (COREG) 3.125 MG tablet Take 1 tablet  (3.125 mg total) by mouth every morning. Patient not taking: Reported on 09/30/2017 02/10/16   Gladstone Lighter, MD  enalapril (VASOTEC) 10 MG tablet Take 0.5 tablets (5 mg total) by mouth daily. Patient not taking: Reported on 09/30/2017 02/10/16   Gladstone Lighter, MD  feeding supplement, ENSURE ENLIVE, (ENSURE ENLIVE) LIQD Take 237 mLs by mouth 2 (two) times daily between meals. 08/24/17   Bettey Costa, MD  guaiFENesin-codeine 100-10 MG/5ML syrup Take 10 mLs by mouth every 4 (four) hours as needed for cough. Patient not taking: Reported on 08/20/2017 07/02/17   Demetrios Loll, MD  levofloxacin (LEVAQUIN) 750 MG tablet Take 1 tablet (750 mg total) by mouth daily. Patient not taking: Reported on 09/30/2017 08/24/17   Bettey Costa, MD  polyethylene glycol (GOLYTELY) 236 g solution Drink one 8 oz glass every 20 mins until stools are clear starting at 5:00pm on 08/14/17 Patient not taking: Reported on 09/30/2017 07/26/17   Lucilla Lame, MD   Allergies  Allergen Reactions  . Carvedilol Other (See Comments)    Pt gets "sick" when on medication    FAMILY HISTORY:  family history includes Asthma in her mother; Breast cancer (age of onset: 45) in her maternal aunt; CAD in her father; COPD in her sister; CVA in her father; Lung cancer in her mother; Stroke in her father. SOCIAL HISTORY:  reports that she has been smoking cigarettes.  She has a 4.10 pack-year smoking history. She has never used smokeless tobacco. She reports that she has current or past drug history. Drug: "Crack" cocaine. She reports that she does not drink alcohol.  REVIEW OF SYSTEMS:   Unable  to obtain due to critical illness   VITAL SIGNS: Temp:  [98.1 F (36.7 C)-99 F (37.2 C)] 99 F (37.2 C) (06/01 0500) Pulse Rate:  [67-95] 86 (06/01 0800) Resp:  [15-31] 22 (06/01 0800) BP: (89-143)/(57-79) 128/77 (06/01 0800) SpO2:  [97 %-100 %] 100 % (06/01 0814) FiO2 (%):  [24 %-35 %] 24 % (06/01 0814) Weight:  [60.2 kg (132 lb 11.5 oz)] 60.2  kg (132 lb 11.5 oz) (06/01 0500)  Physical Examination:  Awake and following commands. No focal motor deficits. Trach in place, on vent, bilateral air entry is equal with no adventitious sound. S1 & S2 are audible with no murmur Benign abdomen was normal peristalsis Sacral decub stage II. With the extremities no leg edema  ASSESSMENT / PLAN: Acute on chronic respiratory failure status post placement of tracheostomy on 5/16/2019with ventilator dependency.  -Vent and trach weaning as tolerated.  Hypotension with intravascular volume depletion. / sepesis -Optimize volume and monitor hemodynamics. -Empiric Rocephin, follow with blood culture and Lactic acid level.  COPD with frequent exacerbations and reactive airway disease. -Bronchial dilators + inhaled steroids  Anxiety and panic attacks(improved).MRI of the brain 5/23 did not show any acute intracranial abnormalities. -Klonapin + Paxil + Seroquel + Respirable+ Ativan PRN -Consider psych evaluation to optimize antipsychotics.  HTN.Echo in 2017 LVEF 55 to 60% -Optimize antihypertensives and monitor hemodynamics.  Questionable seizure activity.On Keppra  Sacral decub stage II -Wound care  Anemia.  Hemoglobin 6.7 will transfuse 1 RBCs. -Keep hemoglobin more than 7 g/dL.  Hypophosphatemia -Replete and monitor electrolytes.  Deconditioning -Supportive care  Dysphagia s/p Placement of PEG on 10/31/2017  Hypokalemia -Replete and monitor electrolytes.  No DVT Rt U E as per venous Doppler.  Follow with case management for placement  Full code  DVT &GI prophylaxis. Continue with supportive care   Critical care time30 minutes

## 2017-11-02 NOTE — Progress Notes (Signed)
Pt care assumed. Report received

## 2017-11-03 LAB — GLUCOSE, CAPILLARY
GLUCOSE-CAPILLARY: 138 mg/dL — AB (ref 65–99)
GLUCOSE-CAPILLARY: 113 mg/dL — AB (ref 65–99)
GLUCOSE-CAPILLARY: 111 mg/dL — AB (ref 65–99)
Glucose-Capillary: 101 mg/dL — ABNORMAL HIGH (ref 65–99)
Glucose-Capillary: 132 mg/dL — ABNORMAL HIGH (ref 65–99)

## 2017-11-03 LAB — TYPE AND SCREEN
ABO/RH(D): AB POS
Antibody Screen: NEGATIVE
UNIT DIVISION: 0

## 2017-11-03 LAB — BPAM RBC
BLOOD PRODUCT EXPIRATION DATE: 201906062359
ISSUE DATE / TIME: 201906011742
UNIT TYPE AND RH: 1700

## 2017-11-03 LAB — BASIC METABOLIC PANEL
ANION GAP: 5 (ref 5–15)
BUN: 18 mg/dL (ref 6–20)
CHLORIDE: 104 mmol/L (ref 101–111)
CO2: 27 mmol/L (ref 22–32)
Calcium: 7.9 mg/dL — ABNORMAL LOW (ref 8.9–10.3)
Creatinine, Ser: <0.3 mg/dL — ABNORMAL LOW (ref 0.44–1.00)
Glucose, Bld: 134 mg/dL — ABNORMAL HIGH (ref 65–99)
POTASSIUM: 3.5 mmol/L (ref 3.5–5.1)
SODIUM: 136 mmol/L (ref 135–145)

## 2017-11-03 LAB — CBC WITH DIFFERENTIAL/PLATELET
BASOS ABS: 0 10*3/uL (ref 0–0.1)
BASOS PCT: 0 %
Eosinophils Absolute: 0 10*3/uL (ref 0–0.7)
Eosinophils Relative: 0 %
HCT: 24.4 % — ABNORMAL LOW (ref 35.0–47.0)
HEMOGLOBIN: 8.2 g/dL — AB (ref 12.0–16.0)
LYMPHS ABS: 0.4 10*3/uL — AB (ref 1.0–3.6)
Lymphocytes Relative: 8 %
MCH: 29.2 pg (ref 26.0–34.0)
MCHC: 33.4 g/dL (ref 32.0–36.0)
MCV: 87.2 fL (ref 80.0–100.0)
Monocytes Absolute: 0.4 10*3/uL (ref 0.2–0.9)
Monocytes Relative: 8 %
NEUTROS PCT: 84 %
Neutro Abs: 4.3 10*3/uL (ref 1.4–6.5)
PLATELETS: 185 10*3/uL (ref 150–440)
RBC: 2.79 MIL/uL — AB (ref 3.80–5.20)
RDW: 16.8 % — ABNORMAL HIGH (ref 11.5–14.5)
WBC: 5.1 10*3/uL (ref 3.6–11.0)

## 2017-11-03 LAB — MAGNESIUM: MAGNESIUM: 1.8 mg/dL (ref 1.7–2.4)

## 2017-11-03 LAB — PHOSPHORUS: PHOSPHORUS: 1.9 mg/dL — AB (ref 2.5–4.6)

## 2017-11-03 MED ORDER — CLONAZEPAM 1 MG PO TABS
1.0000 mg | ORAL_TABLET | Freq: Four times a day (QID) | ORAL | Status: DC
  Administered 2017-11-03 – 2017-11-05 (×11): 1 mg via ORAL
  Filled 2017-11-03 (×11): qty 1

## 2017-11-03 MED ORDER — FUROSEMIDE 20 MG PO TABS
20.0000 mg | ORAL_TABLET | Freq: Every day | ORAL | Status: AC
  Administered 2017-11-03 – 2017-11-04 (×2): 20 mg via ORAL
  Filled 2017-11-03 (×2): qty 1

## 2017-11-03 NOTE — Progress Notes (Addendum)
[facility] [place] NAME: Michelle [name]    MR#:  [id number]  DATE OF BIRTH:  11-Mar-1961  SUBJECTIVE:  CHIEF COMPLAINT:   Chief Complaint  Patient presents with  . Shortness of Breath   Remains on the ventilator  REVIEW OF SYSTEMS:  CONSTITUTIONAL: Unable to communicate  ROS  DRUG ALLERGIES:   Allergies  Allergen Reactions  . Carvedilol Other (See Comments)    Pt gets "sick" when on medication    VITALS:  Blood pressure 90/63, pulse 88, temperature 98.2 F (36.8 C), temperature source Axillary, resp. rate (!) 21, height 4\' 9"  (1.448 m), weight 59.8 kg (131 lb 13.4 oz), last menstrual period 03/06/2005, SpO2 98 %.  PHYSICAL EXAMINATION:  GENERAL:  57 y.o.-year-old patient lying in the bed with no acute distress.  EYES: Pupils equal, round, reactive to light and accommodation. No scleral icterus. Extraocular muscles intact.  HEENT: Head atraumatic, normocephalic. Oropharynx and nasopharynx clear.  NECK:  Supple, no jugular venous distention. No thyroid enlargement, no tenderness.  LUNGS: Normal breath sounds bilaterally, no wheezing, rales,rhonchi or crepitation. No use of accessory muscles of respiration.  CARDIOVASCULAR: S1, S2 normal. No murmurs, rubs, or gallops.  ABDOMEN: Soft, nontender, nondistended. Bowel sounds present. No organomegaly or mass.  EXTREMITIES: No pedal edema, cyanosis, or clubbing.  NEUROLOGIC: Cranial nerves II through XII are intact. Muscle strength 5/5 in all extremities. Sensation intact. Gait not checked.  PSYCHIATRIC: The patient is alert and oriented x 3.  SKIN: No obvious rash, lesion, or ulcer.   Physical Exam LABORATORY PANEL:   CBC Recent Labs  Lab 11/03/17 0556  WBC 5.1  HGB 8.2*  HCT 24.4*  PLT 185   ------------------------------------------------------------------------------------------------------------------  Chemistries  Recent Labs  Lab 10/31/17 0412  11/03/17 0556  NA 138    < > 136  K 3.4*   < > 3.5  CL 104   < > 104  CO2 27   < > 27  GLUCOSE 90   < > 134*  BUN 17   < > 18  CREATININE <0.30*   < > <0.30*  CALCIUM 7.9*   < > 7.9*  MG 1.7   < > 1.8  AST 16  --   --   ALT 27  --   --   ALKPHOS 46  --   --   BILITOT 0.5  --   --    < > = values in this interval not displayed.   ------------------------------------------------------------------------------------------------------------------  Cardiac Enzymes No results for input(s): TROPONINI in the last 168 hours. ------------------------------------------------------------------------------------------------------------------  RADIOLOGY:  Dg Chest Port 1 View  Result Date: 11/02/2017 CLINICAL DATA:  Followup pneumonia. EXAM: PORTABLE CHEST 1 VIEW COMPARISON:  10/31/2017 and older exams. FINDINGS: Lungs are hyperexpanded with prominent bronchovascular markings consistent with COPD. Mild linear scarring noted in the left mid lung is stable. No evidence of pneumonia or pulmonary edema. Tracheostomy tube and right PICC are stable and well positioned. The nasal/orogastric tube has been removed since the prior exam. IMPRESSION: 1. No acute findings.  No evidence of pneumonia or pulmonary edema. 2. COPD. 3. Well-positioned tracheostomy tube and right PICC. Electronically Signed   By: Lajean Manes M.D.   On: 11/02/2017 07:15    ASSESSMENT AND PLAN:  *Acute on chronic hypoxic respiratory failure  Stable s/p trach placement Oct 17, 2017 Secondary to COPD exacerbation Difficult weaning off the vent, continue CPAP trials and weaning as tolerated    *Acute  on COPD exacerbation Resolved Continue inhaled corticosteroids, BTs prn, supplemental oxygen/ventilator weaning as tolerated  *Chronic benign essential hypertension Controlled on current regiment  *Acute on chronic general anxiety disorder, unspecified Continue current psychotropic regiment  Psychiatry input greatly appreciated    *Chronic stage II  sacral decubitus wound Stable continue local wound care    All the records are reviewed and case discussed with Care Management/Social Workerr. Management plans discussed with the patient, family and they are in agreement.  CODE STATUS: partial  TOTAL TIME TAKING CARE OF THIS PATIENT: 35 minutes.        Dustin Flock M.D on 11/03/2017   Between 7am to 6pm - Pager - [phone]  After 6pm go to [email or web address] - password [email or web address] [facility]  Office  [phone]  CC: Primary care physician; Center, [facility]  Note: This dictation was prepared with Dragon dictation along with smaller phrase technology. Any transcriptional errors that result from this process are unintentional.

## 2017-11-03 NOTE — Progress Notes (Signed)
Name: Michelle [name] MRN: [id number] DOB: September 13, 1960     CONSULTATION DATE: 09/30/2017 Objectives and subjective: Patient continues to be on the ventilator, tolerating tube feeding, infrequent panic episodes requiring PRN benzodiazepine.  PAST MEDICAL HISTORY :   has a past medical history of Allergy, Anxiety, Asthma, CHF (congestive heart failure) ([place]), Cocaine abuse ([place]), COPD (chronic obstructive pulmonary disease) ([place]), Coronary artery disease, Emphysema/COPD ([place]), Hepatitis C, High cholesterol, Hypertension, Pneumothorax, and Tobacco abuse.  has a past surgical history that includes Cardiac catheterization; Chest tube insertion; Tracheostomy tube placement (N/A, 10/17/2017); and IR GASTROSTOMY TUBE MOD SED (10/31/2017). Prior to Admission medications   Medication Sig Start Date End Date Taking? Authorizing Provider  albuterol (PROVENTIL HFA;VENTOLIN HFA) 108 (90 Base) MCG/ACT inhaler Inhale 2 puffs into the lungs every 6 (six) hours as needed for wheezing or shortness of breath. 02/26/17  Yes Dustin Flock, MD  ALPRAZolam Duanne Moron) 0.5 MG tablet Take 1 tablet (0.5 mg total) by mouth 3 (three) times daily as needed for anxiety. 08/24/17  Yes Bettey Costa, MD  aspirin EC 81 MG tablet Take 81 mg by mouth daily.   Yes [provider]  ipratropium-albuterol (DUONEB) 0.5-2.5 (3) MG/3ML SOLN Take 3 mLs by nebulization every 6 (six) hours as needed. 01/04/17  Yes [name], MD  mirtazapine (REMERON) 30 MG tablet Take 30 mg by mouth at bedtime.   Yes [provider]  predniSONE (DELTASONE) 10 MG tablet Take 1 tablet (10 mg total) by mouth daily with breakfast. 08/24/17  Yes Mody, Sital, MD  SYMBICORT 160-4.5 MCG/ACT inhaler Take 2 puffs by mouth 2 (two) times daily.   Yes [provider]  tiotropium (SPIRIVA) 18 MCG inhalation capsule Place 1 capsule (18 mcg total) into inhaler and inhale daily. 01/04/17  Yes [name], MD  carvedilol (COREG) 3.125 MG  tablet Take 1 tablet (3.125 mg total) by mouth every morning. Patient not taking: Reported on 09/30/2017 02/10/16   Gladstone Lighter, MD  enalapril (VASOTEC) 10 MG tablet Take 0.5 tablets (5 mg total) by mouth daily. Patient not taking: Reported on 09/30/2017 02/10/16   Gladstone Lighter, MD  feeding supplement, ENSURE ENLIVE, (ENSURE ENLIVE) LIQD Take 237 mLs by mouth 2 (two) times daily between meals. 08/24/17   Bettey Costa, MD  guaiFENesin-codeine 100-10 MG/5ML syrup Take 10 mLs by mouth every 4 (four) hours as needed for cough. Patient not taking: Reported on 08/20/2017 07/02/17   Demetrios Loll, MD  levofloxacin (LEVAQUIN) 750 MG tablet Take 1 tablet (750 mg total) by mouth daily. Patient not taking: Reported on 09/30/2017 08/24/17   Bettey Costa, MD  polyethylene glycol (GOLYTELY) 236 g solution Drink one 8 oz glass every 20 mins until stools are clear starting at 5:00pm on 08/14/17 Patient not taking: Reported on 09/30/2017 07/26/17   Lucilla Lame, MD   Allergies  Allergen Reactions  . Carvedilol Other (See Comments)    Pt gets "sick" when on medication    FAMILY HISTORY:  family history includes Asthma in her mother; Breast cancer (age of onset: 72) in her maternal aunt; CAD in her father; COPD in her sister; CVA in her father; Lung cancer in her mother; Stroke in her father. SOCIAL HISTORY:  reports that she has been smoking cigarettes.  She has a 4.10 pack-year smoking history. She has never used smokeless tobacco. She reports that she has current or past drug history. Drug: "Crack" cocaine. She reports that she does not drink alcohol.  REVIEW OF SYSTEMS:   Unable  to obtain due to critical illness   VITAL SIGNS: Temp:  [98 F (36.7 C)-100.8 F (38.2 C)] 98.2 F (36.8 C) (06/02 0400) Pulse Rate:  [77-136] 88 (06/02 0900) Resp:  [17-33] 21 (06/02 0900) BP: (76-207)/(51-125) 90/63 (06/02 0900) SpO2:  [95 %-100 %] 98 % (06/02 0900) FiO2 (%):  [35 %] 35 % (06/02 0725) Weight:  [59.8 kg (131  lb 13.4 oz)] 59.8 kg (131 lb 13.4 oz) (06/02 0434)  Physical Examination:  Sedated s/p versed. Trach in place, on vent, bilateral air entry is equal with no adventitious sound. S1 & S2 are audible with no murmur Benign abdomen was normal peristalsis Sacral decub stage II. Anasarca 1+  ASSESSMENT / PLAN: Acute on chronic respiratory failure status post placement of tracheostomy on 5/16/2019with ventilator dependency. Continues to have poor weaning parameters because of panic attacks on PS. -Vent and trach weaning as tolerated.  Hypotension with intravascular volume depletion. / sepsis (improved) -Optimize volume and monitor hemodynamics. -Empiric Rocephin, and cultures remains -ve.  COPD with frequent exacerbations and reactive airway disease. -Bronchial dilators + inhaled steroids  Anxiety and panic attacks(improved).MRI of the brain 5/23 did not show any acute intracranial abnormalities. -Klonapin + Paxil + Seroquel + Respirable+ Ativan PRN -Consider psych evaluation to optimize antipsychotics.  HTN.Echo in 2017 LVEF 55 to 60% -Optimize antihypertensives and monitor hemodynamics.  Anasarca with volume overload -Optimize volume and diuresis.  Questionable seizure activity.On Keppra  Sacral decub stage II -Wound care  Anemias/p transfusion. -Keep hemoglobin more than 7 g/dL.  Deconditioning -Supportive care  Dysphagias/p Placement of PEG on 10/31/2017  Hypophosphatemia -Replete and monitor electrolytes.  No DVT Rt U E as per venous Doppler.  Follow with case management for placement  Full code  DVT &GI prophylaxis. Continue with supportive care   Critical care time30 minutes

## 2017-11-03 NOTE — Progress Notes (Signed)
Patient finally within normal range for blood pressure and HR. Resting without incident. incontinent several times this shift. Continue on tube feeding and had sliding scale coverage q 4 hours this shift. No concerns at this time.

## 2017-11-04 LAB — CBC WITH DIFFERENTIAL/PLATELET
BASOS ABS: 0 10*3/uL (ref 0–0.1)
Basophils Relative: 0 %
Eosinophils Absolute: 0 10*3/uL (ref 0–0.7)
Eosinophils Relative: 0 %
HEMATOCRIT: 24.5 % — AB (ref 35.0–47.0)
Hemoglobin: 8 g/dL — ABNORMAL LOW (ref 12.0–16.0)
LYMPHS ABS: 0.4 10*3/uL — AB (ref 1.0–3.6)
LYMPHS PCT: 7 %
MCH: 28.9 pg (ref 26.0–34.0)
MCHC: 32.9 g/dL (ref 32.0–36.0)
MCV: 87.9 fL (ref 80.0–100.0)
Monocytes Absolute: 0.4 10*3/uL (ref 0.2–0.9)
Monocytes Relative: 7 %
NEUTROS ABS: 4.4 10*3/uL (ref 1.4–6.5)
Neutrophils Relative %: 86 %
Platelets: 219 10*3/uL (ref 150–440)
RBC: 2.78 MIL/uL — ABNORMAL LOW (ref 3.80–5.20)
RDW: 17 % — ABNORMAL HIGH (ref 11.5–14.5)
WBC: 5.2 10*3/uL (ref 3.6–11.0)

## 2017-11-04 LAB — BASIC METABOLIC PANEL
ANION GAP: 6 (ref 5–15)
BUN: 20 mg/dL (ref 6–20)
CALCIUM: 8.2 mg/dL — AB (ref 8.9–10.3)
CO2: 28 mmol/L (ref 22–32)
Chloride: 103 mmol/L (ref 101–111)
Creatinine, Ser: <0.3 mg/dL — ABNORMAL LOW (ref 0.44–1.00)
Glucose, Bld: 148 mg/dL — ABNORMAL HIGH (ref 65–99)
Potassium: 3.3 mmol/L — ABNORMAL LOW (ref 3.5–5.1)
Sodium: 137 mmol/L (ref 135–145)

## 2017-11-04 LAB — HEPATIC FUNCTION PANEL
ALT: 24 U/L (ref 14–54)
AST: 17 U/L (ref 15–41)
Albumin: 2.2 g/dL — ABNORMAL LOW (ref 3.5–5.0)
Alkaline Phosphatase: 97 U/L (ref 38–126)
BILIRUBIN DIRECT: 0.1 mg/dL (ref 0.1–0.5)
BILIRUBIN INDIRECT: 0.3 mg/dL (ref 0.3–0.9)
Total Bilirubin: 0.4 mg/dL (ref 0.3–1.2)
Total Protein: 5.2 g/dL — ABNORMAL LOW (ref 6.5–8.1)

## 2017-11-04 LAB — GLUCOSE, CAPILLARY
GLUCOSE-CAPILLARY: 121 mg/dL — AB (ref 65–99)
GLUCOSE-CAPILLARY: 174 mg/dL — AB (ref 65–99)
Glucose-Capillary: 120 mg/dL — ABNORMAL HIGH (ref 65–99)
Glucose-Capillary: 134 mg/dL — ABNORMAL HIGH (ref 65–99)
Glucose-Capillary: 135 mg/dL — ABNORMAL HIGH (ref 65–99)
Glucose-Capillary: 184 mg/dL — ABNORMAL HIGH (ref 65–99)

## 2017-11-04 LAB — CALCIUM, IONIZED: Calcium, Ionized, Serum: 4.9 mg/dL (ref 4.5–5.6)

## 2017-11-04 LAB — PHOSPHORUS: PHOSPHORUS: 1.6 mg/dL — AB (ref 2.5–4.6)

## 2017-11-04 LAB — MAGNESIUM: MAGNESIUM: 1.6 mg/dL — AB (ref 1.7–2.4)

## 2017-11-04 MED ORDER — MAGNESIUM SULFATE 4 GM/100ML IV SOLN
4.0000 g | Freq: Once | INTRAVENOUS | Status: AC
  Administered 2017-11-04: 4 g via INTRAVENOUS
  Filled 2017-11-04: qty 100

## 2017-11-04 MED ORDER — POTASSIUM PHOSPHATES 15 MMOLE/5ML IV SOLN
30.0000 mmol | Freq: Once | INTRAVENOUS | Status: AC
  Administered 2017-11-04: 30 mmol via INTRAVENOUS
  Filled 2017-11-04: qty 10

## 2017-11-04 NOTE — Progress Notes (Signed)
PT Cancellation Note  Patient Details Name: Michelle [name] MRN: [id number] DOB: 31-Mar-1961   Cancelled Treatment:    Reason Eval/Treat Not Completed: Fatigue/lethargy limiting ability to participate. Treatment attempted; pt soundly sleeping and unable to awaken through voice and/or touch. Re attempt tomorrow.    Larae Grooms, PTA 11/04/2017, 2:30 PM

## 2017-11-04 NOTE — Progress Notes (Signed)
Name: Michelle [name] MRN: [id number] DOB: 09-Jan-1961     CONSULTATION DATE: 09/30/2017 Objectives and subjective: Patient continues to be on the ventilator, tolerating tube feeding, infrequent panic episodes requiring PRN benzodiazepine.  PAST MEDICAL HISTORY :   has a past medical history of Allergy, Anxiety, Asthma, CHF (congestive heart failure) ([place]), Cocaine abuse ([place]), COPD (chronic obstructive pulmonary disease) ([place]), Coronary artery disease, Emphysema/COPD ([place]), Hepatitis C, High cholesterol, Hypertension, Pneumothorax, and Tobacco abuse.  has a past surgical history that includes Cardiac catheterization; Chest tube insertion; Tracheostomy tube placement (N/A, 10/17/2017); and IR GASTROSTOMY TUBE MOD SED (10/31/2017). Prior to Admission medications   Medication Sig Start Date End Date Taking? Authorizing Provider  albuterol (PROVENTIL HFA;VENTOLIN HFA) 108 (90 Base) MCG/ACT inhaler Inhale 2 puffs into the lungs every 6 (six) hours as needed for wheezing or shortness of breath. 02/26/17  Yes Dustin Flock, MD  ALPRAZolam Duanne Moron) 0.5 MG tablet Take 1 tablet (0.5 mg total) by mouth 3 (three) times daily as needed for anxiety. 08/24/17  Yes Bettey Costa, MD  aspirin EC 81 MG tablet Take 81 mg by mouth daily.   Yes [provider]  ipratropium-albuterol (DUONEB) 0.5-2.5 (3) MG/3ML SOLN Take 3 mLs by nebulization every 6 (six) hours as needed. 01/04/17  Yes [name], MD  mirtazapine (REMERON) 30 MG tablet Take 30 mg by mouth at bedtime.   Yes [provider]  predniSONE (DELTASONE) 10 MG tablet Take 1 tablet (10 mg total) by mouth daily with breakfast. 08/24/17  Yes Mody, Sital, MD  SYMBICORT 160-4.5 MCG/ACT inhaler Take 2 puffs by mouth 2 (two) times daily.   Yes [provider]  tiotropium (SPIRIVA) 18 MCG inhalation capsule Place 1 capsule (18 mcg total) into inhaler and inhale daily. 01/04/17  Yes [name], MD  carvedilol (COREG) 3.125 MG  tablet Take 1 tablet (3.125 mg total) by mouth every morning. Patient not taking: Reported on 09/30/2017 02/10/16   Gladstone Lighter, MD  enalapril (VASOTEC) 10 MG tablet Take 0.5 tablets (5 mg total) by mouth daily. Patient not taking: Reported on 09/30/2017 02/10/16   Gladstone Lighter, MD  feeding supplement, ENSURE ENLIVE, (ENSURE ENLIVE) LIQD Take 237 mLs by mouth 2 (two) times daily between meals. 08/24/17   Bettey Costa, MD  guaiFENesin-codeine 100-10 MG/5ML syrup Take 10 mLs by mouth every 4 (four) hours as needed for cough. Patient not taking: Reported on 08/20/2017 07/02/17   Demetrios Loll, MD  levofloxacin (LEVAQUIN) 750 MG tablet Take 1 tablet (750 mg total) by mouth daily. Patient not taking: Reported on 09/30/2017 08/24/17   Bettey Costa, MD  polyethylene glycol (GOLYTELY) 236 g solution Drink one 8 oz glass every 20 mins until stools are clear starting at 5:00pm on 08/14/17 Patient not taking: Reported on 09/30/2017 07/26/17   Lucilla Lame, MD   Allergies  Allergen Reactions  . Carvedilol Other (See Comments)    Pt gets "sick" when on medication    FAMILY HISTORY:  family history includes Asthma in her mother; Breast cancer (age of onset: 72) in her maternal aunt; CAD in her father; COPD in her sister; CVA in her father; Lung cancer in her mother; Stroke in her father. SOCIAL HISTORY:  reports that she has been smoking cigarettes.  She has a 4.10 pack-year smoking history. She has never used smokeless tobacco. She reports that she has current or past drug history. Drug: "Crack" cocaine. She reports that she does not drink alcohol.  REVIEW OF SYSTEMS:   Unable  to obtain due to critical illness   VITAL SIGNS: Temp:  [97.6 F (36.4 C)-100.3 F (37.9 C)] 100.3 F (37.9 C) (06/03 0829) Pulse Rate:  [77-113] 98 (06/03 0829) Resp:  [14-39] 22 (06/03 0700) BP: (83-182)/(57-110) 126/76 (06/03 0829) SpO2:  [9 %-100 %] 94 % (06/03 0829) FiO2 (%):  [30 %] 30 % (06/03 0829) Weight:  [133 lb  6.1 oz (60.5 kg)] 133 lb 6.1 oz (60.5 kg) (06/03 0455)  Physical Examination:  Sedated s/p versed. Trach in place, on vent, bilateral air entry is equal with no adventitious sound. S1 & S2 are audible with no murmur Benign abdomen was normal peristalsis Sacral decub stage II. Anasarca 1+  ASSESSMENT / PLAN: Acute on chronic respiratory failure status post placement of tracheostomy on 5/16/2019with ventilator dependency. Continues to have poor weaning parameters because of panic attacks on PS. -Vent and trach weaning as tolerated.  Hypotension with intravascular volume depletion. / sepsis (improved) -Optimize volume and monitor hemodynamics. -Empiric Rocephin, and cultures remains -ve.  COPD with frequent exacerbations and reactive airway disease. -Bronchial dilators + inhaled steroids  Anxiety and panic attacks(improved).MRI of the brain 5/23 did not show any acute intracranial abnormalities. -Klonapin + Paxil + Seroquel + Respirable+ Ativan PRN -Consider psych evaluation to optimize antipsychotics.  HTN.Echo in 2017 LVEF 55 to 60% -Optimize antihypertensives and monitor hemodynamics.  Questionable seizure activity.On Keppra  Sacral decub stage II, Wound care  Deconditioning -Supportive care  Dysphagias/p Placement of PEG on 10/31/2017  Follow with case management for placement  Full code  DVT &GI prophylaxis. Continue with supportive care   Critical care time30 minutes       Patient ID: Michelle [name], female   DOB: 09-22-1960, 57 y.o.   MRN: [id number]

## 2017-11-04 NOTE — Progress Notes (Addendum)
[place] at [place] NAME: Michelle [name]    MR#:  [id number]  DATE OF BIRTH:  12-21-1960  SUBJECTIVE:  CHIEF COMPLAINT:   Chief Complaint  Patient presents with  . Shortness of Breath   Remains on the ventilator  REVIEW OF SYSTEMS:  CONSTITUTIONAL: Unable to communicate  ROS  DRUG ALLERGIES:   Allergies  Allergen Reactions  . Carvedilol Other (See Comments)    Pt gets "sick" when on medication    VITALS:  Blood pressure (!) 91/58, pulse 78, temperature (!) 101.6 F (38.7 C), resp. rate (!) 22, height 4\' 9"  (1.448 m), weight 60.5 kg (133 lb 6.1 oz), last menstrual period 03/06/2005, SpO2 97 %.  PHYSICAL EXAMINATION:  GENERAL:  57 y.o.-year-old patient lying in the bed with no acute distress.  EYES: Pupils equal, round, reactive to light and accommodation. No scleral icterus. Extraocular muscles intact.  HEENT: Head atraumatic, normocephalic. Oropharynx and nasopharynx clear.  NECK:  Supple, no jugular venous distention. No thyroid enlargement, no tenderness.  LUNGS: Normal breath sounds bilaterally, no wheezing, rales,rhonchi or crepitation. No use of accessory muscles of respiration.  CARDIOVASCULAR: S1, S2 normal. No murmurs, rubs, or gallops.  ABDOMEN: Soft, nontender, nondistended. Bowel sounds present. No organomegaly or mass.  EXTREMITIES: No pedal edema, cyanosis, or clubbing.  NEUROLOGIC: Cranial nerves II through XII are intact. Muscle strength 5/5 in all extremities. Sensation intact. Gait not checked.  PSYCHIATRIC: The patient is alert and oriented x 3.  SKIN: No obvious rash, lesion, or ulcer.   Physical Exam LABORATORY PANEL:   CBC Recent Labs  Lab 11/04/17 0456  WBC 5.2  HGB 8.0*  HCT 24.5*  PLT 219   ------------------------------------------------------------------------------------------------------------------  Chemistries  Recent Labs  Lab 11/04/17 0456  NA 137  K 3.3*  CL 103  CO2 28  GLUCOSE  148*  BUN 20  CREATININE <0.30*  CALCIUM 8.2*  MG 1.6*  AST 17  ALT 24  ALKPHOS 97  BILITOT 0.4   ------------------------------------------------------------------------------------------------------------------  Cardiac Enzymes No results for input(s): TROPONINI in the last 168 hours. ------------------------------------------------------------------------------------------------------------------  RADIOLOGY:  No results found.  ASSESSMENT AND PLAN:  *Acute on chronic hypoxic respiratory failure  Stable s/p trach placement Oct 17, 2017 Secondary to COPD exacerbation Difficult weaning off the vent, continue CPAP trials and weaning as tolerated  *Acute on COPD exacerbation Resolved Continue inhaled corticosteroids, BTs prn, supplemental oxygen/ventilator weaning as tolerated  *Chronic benign essential hypertension Controlled on current regiment  *Acute on chronic general anxiety disorder, unspecified Continue current psychotropic regiment  Psychiatry input greatly appreciated   *Chronic stage II sacral decubitus wound Stable continue local wound care    All the records are reviewed and case discussed with Care Management/Social Workerr. Management plans discussed with the patient, family and they are in agreement.  CODE STATUS: partial  TOTAL TIME TAKING CARE OF THIS PATIENT: 35 minutes.        Dustin Flock M.D on 11/04/2017   Between 7am to 6pm - Pager - [phone]  After 6pm go to [email or web address] - password [email or web address] [facility]  Office  [phone]  CC: Primary care physician; Center, [facility]  Note: This dictation was prepared with Dragon dictation along with smaller phrase technology. Any transcriptional errors that result from this process are unintentional.

## 2017-11-04 NOTE — Progress Notes (Signed)
Only medicated patient twice for anxiety with morphine this shift. Did better than she did the night before when she required several medications to calm her down. Heavy urinary incontinence so retrying to Purewick. Amber urine in canister.

## 2017-11-04 NOTE — Progress Notes (Signed)
Status quo. Agitated . Medicated x 5 for agitat and increased B/P and heart rate. Tolerating pure wic with good output. Trach and peg care done .Also increased temperature from 100.4-101.6. Decreased with Tylenol per peg.

## 2017-11-04 NOTE — Clinical Social Work Note (Signed)
Patient remains unstable for discharge as she has received intravenous ativan today and will have to be off of intravenous sedatives for at least 4 days. Patient's FIO2 has been appropriate. Patient's family still needs to turn in necessary paperwork as well. Shela Leff MSW,LCSW [phone]

## 2017-11-05 LAB — GLUCOSE, CAPILLARY
GLUCOSE-CAPILLARY: 124 mg/dL — AB (ref 65–99)
GLUCOSE-CAPILLARY: 121 mg/dL — AB (ref 65–99)
GLUCOSE-CAPILLARY: 125 mg/dL — AB (ref 65–99)
GLUCOSE-CAPILLARY: 109 mg/dL — AB (ref 65–99)
Glucose-Capillary: 115 mg/dL — ABNORMAL HIGH (ref 65–99)
Glucose-Capillary: 122 mg/dL — ABNORMAL HIGH (ref 65–99)
Glucose-Capillary: 125 mg/dL — ABNORMAL HIGH (ref 65–99)
Glucose-Capillary: 131 mg/dL — ABNORMAL HIGH (ref 65–99)

## 2017-11-05 LAB — CALCIUM, IONIZED: CALCIUM, IONIZED, SERUM: 5 mg/dL (ref 4.5–5.6)

## 2017-11-05 LAB — CBC WITH DIFFERENTIAL/PLATELET
BASOS ABS: 0 10*3/uL (ref 0–0.1)
Basophils Relative: 0 %
EOS ABS: 0 10*3/uL (ref 0–0.7)
EOS PCT: 0 %
HCT: 24.6 % — ABNORMAL LOW (ref 35.0–47.0)
Hemoglobin: 8.2 g/dL — ABNORMAL LOW (ref 12.0–16.0)
LYMPHS ABS: 0.4 10*3/uL — AB (ref 1.0–3.6)
Lymphocytes Relative: 8 %
MCH: 29 pg (ref 26.0–34.0)
MCHC: 33.4 g/dL (ref 32.0–36.0)
MCV: 86.9 fL (ref 80.0–100.0)
MONO ABS: 0.4 10*3/uL (ref 0.2–0.9)
Monocytes Relative: 7 %
Neutro Abs: 4.4 10*3/uL (ref 1.4–6.5)
Neutrophils Relative %: 85 %
PLATELETS: 231 10*3/uL (ref 150–440)
RBC: 2.83 MIL/uL — ABNORMAL LOW (ref 3.80–5.20)
RDW: 16.7 % — AB (ref 11.5–14.5)
WBC: 5.3 10*3/uL (ref 3.6–11.0)

## 2017-11-05 MED ORDER — QUETIAPINE FUMARATE 25 MG PO TABS
50.0000 mg | ORAL_TABLET | Freq: Two times a day (BID) | ORAL | Status: DC
  Administered 2017-11-05 – 2017-11-06 (×3): 50 mg
  Filled 2017-11-05 (×3): qty 2

## 2017-11-05 MED ORDER — NYSTATIN 100000 UNIT/GM EX CREA
TOPICAL_CREAM | Freq: Two times a day (BID) | CUTANEOUS | Status: DC
  Administered 2017-11-05 – 2017-11-15 (×22): via TOPICAL
  Administered 2017-11-16: 1 via TOPICAL
  Administered 2017-11-16 – 2017-11-18 (×4): via TOPICAL
  Administered 2017-11-18: 1 via TOPICAL
  Administered 2017-11-19 – 2017-11-20 (×4): via TOPICAL
  Administered 2017-11-21: 1 via TOPICAL
  Administered 2017-11-21 – 2017-11-23 (×5): via TOPICAL
  Administered 2017-11-24 – 2017-11-25 (×3): 1 via TOPICAL
  Administered 2017-11-25 – 2017-11-26 (×2): via TOPICAL
  Administered 2017-11-26: 1 via TOPICAL
  Administered 2017-11-27: 22:00:00 via TOPICAL
  Administered 2017-11-27: 1 via TOPICAL
  Administered 2017-11-28 – 2017-12-04 (×14): via TOPICAL
  Administered 2017-12-05: 1 via TOPICAL
  Administered 2017-12-05: 08:00:00 via TOPICAL
  Administered 2017-12-06: 1 via TOPICAL
  Administered 2017-12-06 – 2017-12-09 (×7): via TOPICAL
  Administered 2017-12-10: 1 via TOPICAL
  Administered 2017-12-10 – 2017-12-18 (×17): via TOPICAL
  Administered 2017-12-19: 1 via TOPICAL
  Administered 2017-12-19 – 2017-12-20 (×3): via TOPICAL
  Administered 2017-12-21: 1 via TOPICAL
  Administered 2017-12-21 – 2017-12-24 (×6): via TOPICAL
  Filled 2017-11-05 (×4): qty 15

## 2017-11-05 MED ORDER — CLONAZEPAM 1 MG PO TABS
1.0000 mg | ORAL_TABLET | Freq: Four times a day (QID) | ORAL | Status: DC
  Administered 2017-11-05 – 2017-11-07 (×4): 1 mg via ORAL
  Filled 2017-11-05 (×5): qty 1

## 2017-11-05 MED ORDER — SODIUM CHLORIDE 0.9 % IV SOLN
1.0000 g | Freq: Two times a day (BID) | INTRAVENOUS | Status: DC
  Administered 2017-11-05 (×2): 1 g via INTRAVENOUS
  Filled 2017-11-05 (×4): qty 1

## 2017-11-05 NOTE — Progress Notes (Signed)
Name: Michelle [name] MRN: [id number] DOB: 06/21/1960     CONSULTATION DATE: 09/30/2017 Objectives and subjective: Patient continues to be on the ventilator, tolerating tube feeding, infrequent panic episodes requiring PRN benzodiazepine.  PAST MEDICAL HISTORY :   has a past medical history of Allergy, Anxiety, Asthma, CHF (congestive heart failure) ([place]), Cocaine abuse ([place]), COPD (chronic obstructive pulmonary disease) ([place]), Coronary artery disease, Emphysema/COPD ([place]), Hepatitis C, High cholesterol, Hypertension, Pneumothorax, and Tobacco abuse.  has a past surgical history that includes Cardiac catheterization; Chest tube insertion; Tracheostomy tube placement (N/A, 10/17/2017); and IR GASTROSTOMY TUBE MOD SED (10/31/2017). Prior to Admission medications   Medication Sig Start Date End Date Taking? Authorizing Provider  albuterol (PROVENTIL HFA;VENTOLIN HFA) 108 (90 Base) MCG/ACT inhaler Inhale 2 puffs into the lungs every 6 (six) hours as needed for wheezing or shortness of breath. 02/26/17  Yes Dustin Flock, MD  ALPRAZolam Duanne Moron) 0.5 MG tablet Take 1 tablet (0.5 mg total) by mouth 3 (three) times daily as needed for anxiety. 08/24/17  Yes Bettey Costa, MD  aspirin EC 81 MG tablet Take 81 mg by mouth daily.   Yes [provider]  ipratropium-albuterol (DUONEB) 0.5-2.5 (3) MG/3ML SOLN Take 3 mLs by nebulization every 6 (six) hours as needed. 01/04/17  Yes [name], MD  mirtazapine (REMERON) 30 MG tablet Take 30 mg by mouth at bedtime.   Yes [provider]  predniSONE (DELTASONE) 10 MG tablet Take 1 tablet (10 mg total) by mouth daily with breakfast. 08/24/17  Yes Mody, Sital, MD  SYMBICORT 160-4.5 MCG/ACT inhaler Take 2 puffs by mouth 2 (two) times daily.   Yes [provider]  tiotropium (SPIRIVA) 18 MCG inhalation capsule Place 1 capsule (18 mcg total) into inhaler and inhale daily. 01/04/17  Yes [name], MD  carvedilol (COREG) 3.125 MG  tablet Take 1 tablet (3.125 mg total) by mouth every morning. Patient not taking: Reported on 09/30/2017 02/10/16   Gladstone Lighter, MD  enalapril (VASOTEC) 10 MG tablet Take 0.5 tablets (5 mg total) by mouth daily. Patient not taking: Reported on 09/30/2017 02/10/16   Gladstone Lighter, MD  feeding supplement, ENSURE ENLIVE, (ENSURE ENLIVE) LIQD Take 237 mLs by mouth 2 (two) times daily between meals. 08/24/17   Bettey Costa, MD  guaiFENesin-codeine 100-10 MG/5ML syrup Take 10 mLs by mouth every 4 (four) hours as needed for cough. Patient not taking: Reported on 08/20/2017 07/02/17   Demetrios Loll, MD  levofloxacin (LEVAQUIN) 750 MG tablet Take 1 tablet (750 mg total) by mouth daily. Patient not taking: Reported on 09/30/2017 08/24/17   Bettey Costa, MD  polyethylene glycol (GOLYTELY) 236 g solution Drink one 8 oz glass every 20 mins until stools are clear starting at 5:00pm on 08/14/17 Patient not taking: Reported on 09/30/2017 07/26/17   Lucilla Lame, MD   Allergies  Allergen Reactions  . Carvedilol Other (See Comments)    Pt gets "sick" when on medication    FAMILY HISTORY:  family history includes Asthma in her mother; Breast cancer (age of onset: 10) in her maternal aunt; CAD in her father; COPD in her sister; CVA in her father; Lung cancer in her mother; Stroke in her father. SOCIAL HISTORY:  reports that she has been smoking cigarettes.  She has a 4.10 pack-year smoking history. She has never used smokeless tobacco. She reports that she has current or past drug history. Drug: "Crack" cocaine. She reports that she does not drink alcohol.  REVIEW OF SYSTEMS:   Unable  to obtain due to critical illness   VITAL SIGNS: Temp:  [97.8 F (36.6 C)-101.6 F (38.7 C)] 99.2 F (37.3 C) (06/04 0900) Pulse Rate:  [74-123] 103 (06/04 0900) Resp:  [13-29] 23 (06/04 0900) BP: (91-199)/(57-130) 125/75 (06/04 0900) SpO2:  [91 %-100 %] 100 % (06/04 0900) FiO2 (%):  [30 %] 30 % (06/04 0739) Weight:  [136 lb  11 oz (62 kg)] 136 lb 11 oz (62 kg) (06/04 0500)  Physical Examination:  Sedated s/p versed. Trach in place, on vent, bilateral air entry is equal with no adventitious sound. S1 & S2 are audible with no murmur Benign abdomen was normal peristalsis Sacral decub stage II. Anasarca 1+  ASSESSMENT / PLAN: Acute on chronic respiratory failure status post placement of tracheostomy on 5/16/2019with ventilator dependency. Continues to have poor weaning parameters because of panic attacks on PS. -Vent and trach weaning as tolerated.  COPD with frequent exacerbations and reactive airway disease. Bronchial dilators + inhaled steroids  Anxiety and panic attacks(improved).MRI of the brain 5/23 did not show any acute intracranial abnormalities. Klonapin + Paxil + Seroquel + Respirable+ Ativan PRN. Psychiatry consulted to help optimize medication  HTN.Echo in 2017 LVEF 55 to 60% Optimize antihypertensives and monitor hemodynamics.  Questionable seizure activity.On Keppra  Sacral decub stage II, Wound care  Deconditioning -Supportive care  Dysphagias/p Placement of PEG on 10/31/2017  Follow with case management for placement  Full code  DVT &GI prophylaxis. Continue with supportive care  [name], DO  Patient ID: ALEICIA KENAGY, female   DOB: 05-05-1961, 57 y.o.   MRN: [id number] Patient ID: SINIYAH EVANGELIST, female   DOB: 15-Oct-1960, 57 y.o.   MRN: [id number]

## 2017-11-05 NOTE — Progress Notes (Signed)
Pt resting calmly at this time with no acute distress.  Pt given morphine earlier for general discomfort, increased heart rate, increased respiratory rate with good response. Pt tolerating tube feedings well.  Continues to have copious amounts of oral, tracheal secretions. Trach care performed. Edema decreased in arms with arms elevated on pillows Sisters in to visit and updates given.

## 2017-11-05 NOTE — Progress Notes (Signed)
Physical Therapy Treatment Patient Details Name: Michelle [name] MRN: [id number] DOB: [birth year] Today's Date: 11/05/2017    History of Present Illness Pt is a 57 y/o F who presented with severe SOB with COPD exacerbation.  Chest x-ray was clear, patient was slightly hypotensive after intubation, admitted to the ICU.  Trach tube placed 5/16.  Pt demonstrated seizure like activity on 5/23, MRI negative, EEG abnormal but no epileptiform activity.  Pt's PMH includes cocaine abuse, COPD, CAD, hepatitis C, CHF.      PT Comments    Discussed with nursing prior to session.  OK for exercises.  Participated in exercises as described below.  Pt does not assist with activity/exercises.  Appears generally comfortable during session but some increased agitation noted towards end of session.    Discussed with primary PT.  PT goals need to be updated.  Will schedule for re-eval/goal update.   Follow Up Recommendations  SNF     Equipment Recommendations       Recommendations for Other Services       Precautions / Restrictions Precautions Precautions: Fall;Other (comment) Precaution Comments: trach, NG tube, PICC line Restrictions Weight Bearing Restrictions: No    Mobility  Bed Mobility               General bed mobility comments: deferred  Transfers                    Ambulation/Gait                 Stairs             Wheelchair Mobility    Modified Rankin (Stroke Patients Only)       Balance                                            Cognition Arousal/Alertness: Lethargic Behavior During Therapy: Flat affect Overall Cognitive Status: Difficult to assess                                        Exercises Other Exercises Other Exercises: BLE PROM at ankle, knee, hip x 8 min    General Comments        Pertinent Vitals/Pain Pain Location: no attempts at verbalizations today, generally lethargic Pain  Descriptors / Indicators: Grimacing Pain Intervention(s): Limited activity within patient's tolerance    Home Living                      Prior Function            PT Goals (current goals can now be found in the care plan section) Progress towards PT goals: Not progressing toward goals - comment    Frequency    Min 2X/week      PT Plan Current plan remains appropriate    Co-evaluation              AM-PAC PT "6 Clicks" Daily Activity  Outcome Measure  Difficulty turning over in bed (including adjusting bedclothes, sheets and blankets)?: Unable Difficulty moving from lying on back to sitting on the side of the bed? : Unable Difficulty sitting down on and standing up from a chair with arms (e.g., wheelchair, bedside commode, etc,.)?: Unable Help needed moving to  and from a bed to chair (including a wheelchair)?: Total Help needed walking in hospital room?: Total Help needed climbing 3-5 steps with a railing? : Total 6 Click Score: 6    End of Session   Activity Tolerance: Patient limited by lethargy Patient left: in bed;with call bell/phone within reach;with bed alarm set Nurse Communication: Other (comment)       Time: 8184-0375 PT Time Calculation (min) (ACUTE ONLY): 8 min  Charges:  $Therapeutic Exercise: 8-22 mins                    G Codes:       Chesley Noon, PTA 11/05/17, 11:30 AM

## 2017-11-05 NOTE — Consult Note (Signed)
[place] Nurse wound consult note Reason for Consult:Device related pressure injury, full thickness to tracheostomy peristomal area at 6:00.  Tracheostomy placed 10/17/17 Wound type:DRPI Pressure Injury POA: Yes Measurement: 0.5 cm x 0.4 cm x 0.2 cm   Wound bed: pale pink with purulence noted around stoma Drainage (amount, consistency, odor) purulence  Musty odor Periwound: tracheostomy inner cannula Dressing procedure/placement/frequency: Trach care TID and dry dressing.  May cut pink foam dressing to pad and protect instead of drain sponge if this offloads pressure.  Will not follow at this time.  Please re-consult if needed.  Domenic Moras RN BSN [place] Pager [phone]

## 2017-11-05 NOTE — Clinical Social Work Note (Addendum)
11:00am: CSW was informed by [facility] that they only have 2 female beds left and that patient's family has done nothing to get her the needed paperwork. In addition, CSW informed [facility] that patient received 2 doses of IV ativan yesterday and will need to be 4 days free from IV sedation prior to being appropriate to come to their facility. CSW will follow up with the family to get the paperwork in to [place].  11:28-CSW spoke with [name], patient's daughter, and she stated that they were called yesterday to have a meeting with the MD/PA on Saturday to discuss patient's current state. [name] stated that they are being told that they cannot move patient due to not being able to get her panic attacks and pain under control. Patient has been receiving IV morphine which also would have to be discontinued for 4 days prior to any type of discharge. [name] stated that they are going to wait to complete paperwork for vent/snf until after the meeting on Saturday. CSW advised that if they continue with aggressive care, that the bed at [facility] vent/snf will most likely not be available any longer. Shela Leff MSW,LCSW [phone]

## 2017-11-05 NOTE — Progress Notes (Signed)
Episode of agitation

## 2017-11-05 NOTE — Progress Notes (Signed)
[place] at [place] NAME: Michelle [name]    MR#:  [id number]  DATE OF BIRTH:  1960-09-30  SUBJECTIVE:  CHIEF COMPLAINT:   Chief Complaint  Patient presents with  . Shortness of Breath   Remains on the ventilator no significant change in the current condition  REVIEW OF SYSTEMS:  CONSTITUTIONAL: Unable to communicate  ROS  DRUG ALLERGIES:   Allergies  Allergen Reactions  . Carvedilol Other (See Comments)    Pt gets "sick" when on medication    VITALS:  Blood pressure 112/74, pulse (!) 103, temperature 99.2 F (37.3 C), temperature source Axillary, resp. rate (!) 22, height 4\' 9"  (1.448 m), weight 62 kg (136 lb 11 oz), last menstrual period 03/06/2005, SpO2 99 %.  PHYSICAL EXAMINATION:  GENERAL:  57 y.o.-year-old patient lying in the bed with no acute distress.  EYES: Pupils equal, round, reactive to light and accommodation. No scleral icterus. Extraocular muscles intact.  HEENT: Head atraumatic, normocephalic. Oropharynx and nasopharynx clear.  NECK:  Supple, no jugular venous distention. No thyroid enlargement, no tenderness.  LUNGS: Normal breath sounds bilaterally, no wheezing, rales,rhonchi or crepitation. No use of accessory muscles of respiration.  CARDIOVASCULAR: S1, S2 normal. No murmurs, rubs, or gallops.  ABDOMEN: Soft, nontender, nondistended. Bowel sounds present. No organomegaly or mass.  EXTREMITIES: No pedal edema, cyanosis, or clubbing.  NEUROLOGIC: Cranial nerves II through XII are intact. Muscle strength 5/5 in all extremities. Sensation intact. Gait not checked.  PSYCHIATRIC: The patient is alert and oriented x 3.  SKIN: No obvious rash, lesion, or ulcer.   Physical Exam LABORATORY PANEL:   CBC Recent Labs  Lab 11/05/17 0502  WBC 5.3  HGB 8.2*  HCT 24.6*  PLT 231   ------------------------------------------------------------------------------------------------------------------  Chemistries  Recent  Labs  Lab 11/04/17 0456  NA 137  K 3.3*  CL 103  CO2 28  GLUCOSE 148*  BUN 20  CREATININE <0.30*  CALCIUM 8.2*  MG 1.6*  AST 17  ALT 24  ALKPHOS 97  BILITOT 0.4   ------------------------------------------------------------------------------------------------------------------  Cardiac Enzymes No results for input(s): TROPONINI in the last 168 hours. ------------------------------------------------------------------------------------------------------------------  RADIOLOGY:  No results found.  ASSESSMENT AND PLAN:  *Acute on chronic hypoxic respiratory failure  Stable s/p trach placement Oct 17, 2017 Secondary to COPD exacerbation Difficult weaning off the vent, continue CPAP trials and weaning as tolerated  *Acute on COPD exacerbation Resolved Continue inhaled corticosteroids, BTs prn, supplemental oxygen/ventilator weaning as tolerated  *Chronic benign essential hypertension Controlled on current regiment  *Acute on chronic general anxiety disorder, unspecified Continue current psychotropic regiment  Psychiatry input greatly appreciated   *Chronic stage II sacral decubitus wound Stable continue local wound care    All the records are reviewed and case discussed with Care Management/Social Workerr. Management plans discussed with the patient, family and they are in agreement.  CODE STATUS: partial  TOTAL TIME TAKING CARE OF THIS PATIENT: 35 minutes.        Dustin Flock M.D on 11/05/2017   Between 7am to 6pm - Pager - [phone]  After 6pm go to [email or web address] - password [email or web address] [place] [facility]  Office  [phone]  CC: Primary care physician; Center, [facility]  Note: This dictation was prepared with Dragon dictation along with smaller phrase technology. Any transcriptional errors that result from this process are unintentional.

## 2017-11-05 NOTE — Consult Note (Signed)
[facility] Consult   Reason for Consult: Follow-up consult for 57 year old woman on long-term ventilation who has suffered from anxiety problems Referring Physician: Posey Pronto Patient Identification: Michelle [name] MRN:  [id number] Principal Diagnosis: Panic disorder Diagnosis:   Patient Active Problem List   Diagnosis Date Noted  . Panic disorder [F41.0] 10/29/2017  . Altered mental status [R41.82]   . Pressure injury of skin [L89.90] 10/23/2017  . Acute on chronic respiratory failure ([place]) [J96.20] 09/30/2017  . Acute respiratory failure ([place]) [J96.00] 08/20/2017  . Acute on chronic respiratory failure with hypoxia and hypercapnia (HCC) [R42.70, J96.22] 06/29/2017  . Acute on chronic respiratory failure with hypoxia ([place]) [J96.21] 02/23/2017  . Protein-calorie malnutrition, severe [E43] 05/23/2016  . Sepsis ([place]) [A41.9] 05/22/2016  . Malnutrition of moderate degree ([place]) [E44.0] 10/25/2014  . COPD exacerbation ([place]) [J44.1] 10/24/2014  . Tobacco abuse [Z72.0] 10/24/2014    Total Time spent with patient: 30 minutes  Subjective:   Michelle [name] is a 57 y.o. female patient admitted with patient not able to give information.  HPI: See previous notes.  57 year old woman with an unfortunate string of medical problems that have left her on long-term ventilation with a tracheostomy.  She has a past history of anxiety problems that were poorly controlled even before she came into the hospital and her anxiety has continued to interfere with her breathing.  I spoke with nurses on duty in the ICU today who described how the patient will intermittently have spells of becoming tachypneic and tachycardic and appearing to become anxious and agitated.  These happen irregularly and can be stopped by giving morphine or PRN Ativan.  They seem to happen only a couple times a day and are somewhat unpredictable.  They have been interfering with the patient's prognosis for both going to long-term  treatment and for ultimately getting off the ventilator.  Past Psychiatric History: Patient has a chronic history of anxiety and was on benzodiazepines and serotonin reuptake inhibitors before coming into the hospital.  Risk to Self:   Risk to Others:   Prior Inpatient Therapy:   Prior Outpatient Therapy:    Past Medical History:  Past Medical History:  Diagnosis Date  . Allergy   . Anxiety   . Asthma   . CHF (congestive heart failure) ([place])   . Cocaine abuse ([place])   . COPD (chronic obstructive pulmonary disease) ([place])   . Coronary artery disease   . Emphysema/COPD ([place])   . Hepatitis C   . High cholesterol   . Hypertension   . Pneumothorax   . Tobacco abuse     Past Surgical History:  Procedure Laterality Date  . CARDIAC CATHETERIZATION    . CHEST TUBE INSERTION    . IR GASTROSTOMY TUBE MOD SED  10/31/2017  . TRACHEOSTOMY TUBE PLACEMENT N/A 10/17/2017   Procedure: TRACHEOSTOMY;  Surgeon: Carloyn Manner, MD;  Location: [facility] ORS;  Service: ENT;  Laterality: N/A;   Family History:  Family History  Problem Relation Age of Onset  . Lung cancer Mother   . Asthma Mother   . CVA Father   . CAD Father   . Stroke Father   . Breast cancer Maternal Aunt 67  . COPD Sister    Family Psychiatric  History: Anxiety Social History:  Social History   Substance and Sexual Activity  Alcohol Use No  . Frequency: Never     Social History   Substance and Sexual Activity  Drug Use Yes  . Types: "  Crack" cocaine   Comment: Past cocaine use.  Quit 04/2017    Social History   Socioeconomic History  . Marital status: Single    Spouse name: Not on file  . Number of children: Not on file  . Years of education: Not on file  . Highest education level: Not on file  Occupational History  . Not on file  Social Needs  . Financial resource strain: Somewhat hard  . Food insecurity:    Worry: Sometimes true    Inability: Sometimes true  . Transportation needs:    Medical: Yes     Non-medical: Yes  Tobacco Use  . Smoking status: Current Every Day Smoker    Packs/day: 0.10    Years: 41.00    Pack years: 4.10    Types: Cigarettes  . Smokeless tobacco: Never Used  Substance and Sexual Activity  . Alcohol use: No    Frequency: Never  . Drug use: Yes    Types: "Crack" cocaine    Comment: Past cocaine use.  Quit 04/2017  . Sexual activity: Not Currently    Partners: Male    Birth control/protection: Post-menopausal  Lifestyle  . Physical activity:    Days per week: 0 days    Minutes per session: 0 min  . Stress: To some extent  Relationships  . Social connections:    Talks on phone: Patient refused    Gets together: Patient refused    Attends religious service: Patient refused    Active member of club or organization: Patient refused    Attends meetings of clubs or organizations: Patient refused    Relationship status: Patient refused  Other Topics Concern  . Not on file  Social History Narrative  . Not on file   Additional Social History:    Allergies:   Allergies  Allergen Reactions  . Carvedilol Other (See Comments)    Pt gets "sick" when on medication    Labs:  Results for orders placed or performed during the hospital encounter of 09/30/17 (from the past 48 hour(s))  Glucose, capillary     Status: Abnormal   Collection Time: 11/03/17  8:25 PM  Result Value Ref Range   Glucose-Capillary 111 (H) 65 - 99 mg/dL  Glucose, capillary     Status: Abnormal   Collection Time: 11/04/17 12:28 AM  Result Value Ref Range   Glucose-Capillary 120 (H) 65 - 99 mg/dL   Comment 1 Notify RN   Glucose, capillary     Status: Abnormal   Collection Time: 11/04/17  3:49 AM  Result Value Ref Range   Glucose-Capillary 121 (H) 65 - 99 mg/dL   Comment 1 Notify RN   Hepatic function panel     Status: Abnormal   Collection Time: 11/04/17  4:56 AM  Result Value Ref Range   Total Protein 5.2 (L) 6.5 - 8.1 g/dL   Albumin 2.2 (L) 3.5 - 5.0 g/dL   AST 17 15 - 41  U/L   ALT 24 14 - 54 U/L   Alkaline Phosphatase 97 38 - 126 U/L   Total Bilirubin 0.4 0.3 - 1.2 mg/dL   Bilirubin, Direct 0.1 0.1 - 0.5 mg/dL   Indirect Bilirubin 0.3 0.3 - 0.9 mg/dL    Comment: Performed at [facility], [address] [place], [place] [address]  Basic metabolic panel     Status: Abnormal   Collection Time: 11/04/17  4:56 AM  Result Value Ref Range   Sodium 137 135 - 145  mmol/L   Potassium 3.3 (L) 3.5 - 5.1 mmol/L   Chloride 103 101 - 111 mmol/L   CO2 28 22 - 32 mmol/L   Glucose, Bld 148 (H) 65 - 99 mg/dL   BUN 20 6 - 20 mg/dL   Creatinine, Ser <0.30 (L) 0.44 - 1.00 mg/dL   Calcium 8.2 (L) 8.9 - 10.3 mg/dL   GFR calc non Af Amer NOT CALCULATED >60 mL/min   GFR calc Af Amer NOT CALCULATED >60 mL/min    Comment: (NOTE) The eGFR has been calculated using the CKD EPI equation. This calculation has not been validated in all clinical situations. eGFR's persistently <60 mL/min signify possible Chronic Kidney Disease.    Anion gap 6 5 - 15    Comment: Performed at [facility], [address] [place], [place] [address]  Magnesium     Status: Abnormal   Collection Time: 11/04/17  4:56 AM  Result Value Ref Range   Magnesium 1.6 (L) 1.7 - 2.4 mg/dL    Comment: Performed at [facility], [address] [place], [place] [address]  Phosphorus     Status: Abnormal   Collection Time: 11/04/17  4:56 AM  Result Value Ref Range   Phosphorus 1.6 (L) 2.5 - 4.6 mg/dL    Comment: Performed at [facility], [address] [place], [place] [address]  CBC with Differential/Platelet     Status: Abnormal   Collection Time: 11/04/17  4:56 AM  Result Value Ref Range   WBC 5.2 3.6 - 11.0 K/uL   RBC 2.78 (L) 3.80 - 5.20 MIL/uL   Hemoglobin 8.0 (L) 12.0 - 16.0 g/dL   HCT 24.5 (L) 35.0 - 47.0 %   MCV 87.9 80.0 - 100.0 fL   MCH 28.9 26.0 - 34.0 pg   MCHC 32.9 32.0 - 36.0 g/dL   RDW 17.0 (H) 11.5 - 14.5 %   Platelets 219 150 - 440 K/uL    Neutrophils Relative % 86 %   Neutro Abs 4.4 1.4 - 6.5 K/uL   Lymphocytes Relative 7 %   Lymphs Abs 0.4 (L) 1.0 - 3.6 K/uL   Monocytes Relative 7 %   Monocytes Absolute 0.4 0.2 - 0.9 K/uL   Eosinophils Relative 0 %   Eosinophils Absolute 0.0 0 - 0.7 K/uL   Basophils Relative 0 %   Basophils Absolute 0.0 0 - 0.1 K/uL    Comment: Performed at [facility], [address] [place], [place] [address]  Glucose, capillary     Status: Abnormal   Collection Time: 11/04/17  7:30 AM  Result Value Ref Range   Glucose-Capillary 134 (H) 65 - 99 mg/dL  Glucose, capillary     Status: Abnormal   Collection Time: 11/04/17 11:33 AM  Result Value Ref Range   Glucose-Capillary 174 (H) 65 - 99 mg/dL  Culture, respiratory (NON-Expectorated)     Status: None (Preliminary result)   Collection Time: 11/04/17 12:20 PM  Result Value Ref Range   Specimen Description      SPUTUM Performed at [facility], [address] [place],  [address]    Special Requests      NONE Performed at [facility], [address], [place], [place] [address]    Gram Stain      ABUNDANT WBC PRESENT,BOTH PMN AND MONONUCLEAR RARE GRAM POSITIVE COCCI    Culture      CULTURE REINCUBATED FOR BETTER GROWTH Performed at [facility], [address] [place], [place] [address]    Report Status PENDING   Glucose, capillary     Status: Abnormal   Collection Time: 11/04/17  4:02 PM  Result Value Ref Range   Glucose-Capillary 184 (H) 65 - 99 mg/dL  Glucose, capillary     Status: Abnormal   Collection Time: 11/04/17  8:28 PM  Result Value Ref Range   Glucose-Capillary 135 (H) 65 - 99 mg/dL  Glucose, capillary     Status: Abnormal   Collection Time: 11/05/17 12:06 AM  Result Value Ref Range   Glucose-Capillary 122 (H) 65 - 99 mg/dL  Glucose, capillary     Status: Abnormal   Collection Time: 11/05/17  3:42 AM  Result Value Ref Range   Glucose-Capillary 124 (H) 65 - 99 mg/dL  CBC with  Differential/Platelet     Status: Abnormal   Collection Time: 11/05/17  5:02 AM  Result Value Ref Range   WBC 5.3 3.6 - 11.0 K/uL   RBC 2.83 (L) 3.80 - 5.20 MIL/uL   Hemoglobin 8.2 (L) 12.0 - 16.0 g/dL   HCT 24.6 (L) 35.0 - 47.0 %   MCV 86.9 80.0 - 100.0 fL   MCH 29.0 26.0 - 34.0 pg   MCHC 33.4 32.0 - 36.0 g/dL   RDW 16.7 (H) 11.5 - 14.5 %   Platelets 231 150 - 440 K/uL   Neutrophils Relative % 85 %   Neutro Abs 4.4 1.4 - 6.5 K/uL   Lymphocytes Relative 8 %   Lymphs Abs 0.4 (L) 1.0 - 3.6 K/uL   Monocytes Relative 7 %   Monocytes Absolute 0.4 0.2 - 0.9 K/uL   Eosinophils Relative 0 %   Eosinophils Absolute 0.0 0 - 0.7 K/uL   Basophils Relative 0 %   Basophils Absolute 0.0 0 - 0.1 K/uL    Comment: Performed at [facility], [address] [place], [place] [address]  Glucose, capillary     Status: Abnormal   Collection Time: 11/05/17  5:07 AM  Result Value Ref Range   Glucose-Capillary 115 (H) 65 - 99 mg/dL  Glucose, capillary     Status: Abnormal   Collection Time: 11/05/17  7:38 AM  Result Value Ref Range   Glucose-Capillary 121 (H) 65 - 99 mg/dL  Glucose, capillary     Status: Abnormal   Collection Time: 11/05/17 12:07 PM  Result Value Ref Range   Glucose-Capillary 125 (H) 65 - 99 mg/dL  Glucose, capillary     Status: Abnormal   Collection Time: 11/05/17  3:54 PM  Result Value Ref Range   Glucose-Capillary 131 (H) 65 - 99 mg/dL    Current Facility-Administered Medications  Medication Dose Route Frequency Provider Last Rate Last Dose  . 0.9 %  sodium chloride infusion   Intravenous Continuous Sandi Mariscal, MD   Stopped at 10/31/17 1530  . 0.9 %  sodium chloride infusion   Intravenous Once Samaan, Maged, MD      . acetaminophen (TYLENOL) solution 650 mg  650 mg Per Tube Q6H PRN Cassandria Santee, MD   650 mg at 10/23/17 2340  . albuterol (PROVENTIL) (2.5 MG/3ML) 0.083% nebulizer solution 2.5 mg  2.5 mg Nebulization Q3H PRN Wilhelmina Mcardle, MD   2.5 mg at 10/19/17  0427  . aspirin chewable tablet 81 mg  81 mg Oral Daily Gladstone Lighter, MD   81 mg at 11/05/17 0954  . bisacodyl (DULCOLAX) suppository 10 mg  10 mg Rectal Daily PRN Awilda Bill, NP  10 mg at 10/09/17 0228  . budesonide (PULMICORT) nebulizer solution 0.5 mg  0.5 mg Nebulization BID Cassandria Santee, MD   0.5 mg at 11/05/17 0738  . ceFEPIme (MAXIPIME) 1 g in sodium chloride 0.9 % 100 mL IVPB  1 g Intravenous Q12H Dustin Flock, MD   Stopped at 11/05/17 0530  . chlorhexidine gluconate (MEDLINE KIT) (PERIDEX) 0.12 % solution 15 mL  15 mL Mouth Rinse BID Awilda Bill, NP   15 mL at 11/05/17 0838  . [START ON 11/06/2017] clonazePAM (KLONOPIN) tablet 1 mg  1 mg Oral Q6H Ady Heimann T, MD      . famotidine (PEPCID) tablet 20 mg  20 mg Per Tube Daily Wilhelmina Mcardle, MD   20 mg at 11/05/17 0955  . feeding supplement (VITAL AF 1.2 CAL) liquid 1,000 mL  1,000 mL Per Tube Continuous Wilhelmina Mcardle, MD 45 mL/hr at 11/05/17 1736 1,000 mL at 11/05/17 1736  . fentaNYL (DURAGESIC - dosed mcg/hr) 75 mcg  75 mcg Transdermal Q72H Wilhelmina Mcardle, MD   75 mcg at 11/04/17 1128  . free water 100 mL  100 mL Per Tube Q8H Wilhelmina Mcardle, MD   100 mL at 11/05/17 1512  . glycopyrrolate (ROBINUL) injection 0.2 mg  0.2 mg Intravenous TID PRN Tukov-Yual, Magdalene S, NP   0.2 mg at 11/03/17 2300  . heparin injection 5,000 Units  5,000 Units Subcutaneous Q8H [name], MD   5,000 Units at 11/05/17 1511  . hydrALAZINE (APRESOLINE) injection 10-40 mg  10-40 mg Intravenous Q4H PRN Wilhelmina Mcardle, MD   20 mg at 11/04/17 1028  . insulin aspart (novoLOG) injection 0-9 Units  0-9 Units Subcutaneous Q4H Awilda Bill, NP   1 Units at 11/05/17 1624  . ipratropium-albuterol (DUONEB) 0.5-2.5 (3) MG/3ML nebulizer solution 3 mL  3 mL Nebulization Q6H Wilhelmina Mcardle, MD   3 mL at 11/05/17 1329  . levETIRAcetam (KEPPRA) 500 mg in sodium chloride 0.9 % 100 mL injection   Intravenous Q12H Saundra Shelling, MD    Stopped at 11/05/17 1850  . LORazepam (ATIVAN) injection 1 mg  1 mg Intravenous Q4H PRN Cassandria Santee, MD   1 mg at 11/04/17 1115  . MEDLINE mouth rinse  15 mL Mouth Rinse 10 times per day Awilda Bill, NP   15 mL at 11/05/17 1830  . metoprolol tartrate (LOPRESSOR) injection 2.5-5 mg  2.5-5 mg Intravenous Q3H PRN Wilhelmina Mcardle, MD   5 mg at 11/04/17 1128  . morphine 2 MG/ML injection 2 mg  2 mg Intravenous Q4H PRN Lafayette Dragon, MD   2 mg at 11/05/17 1637  . multivitamin liquid 15 mL  15 mL Per Tube Daily Soyla Murphy, Maged, MD   15 mL at 11/05/17 0958  . nutrition supplement (JUVEN) (JUVEN) powder packet 1 packet  1 packet Per Tube BID Cassandria Santee, MD   1 packet at 11/05/17 1007  . nystatin cream (MYCOSTATIN)   Topical BID Awilda Bill, NP      . PARoxetine (PAXIL) tablet 20 mg  20 mg Per Tube BID Tamma Brigandi, Madie Reno, MD   20 mg at 11/05/17 0958  . QUEtiapine (SEROQUEL) tablet 50 mg  50 mg Per Tube BID Senai Kingsley T, MD      . risperiDONE (RISPERDAL) 1 MG/ML oral solution 2 mg  2 mg Per Tube QHS Wilhelmina Mcardle, MD   2 mg at 11/04/17 2255  . scopolamine (TRANSDERM-SCOP) 1 MG/3DAYS  1.5 mg  1 patch Transdermal Q72H Flora Lipps, MD   1.5 mg at 11/04/17 1148  . senna-docusate (Senokot-S) tablet 1 tablet  1 tablet Per Tube BID Saundra Shelling, MD   1 tablet at 11/05/17 0957  . sodium chloride flush (NS) 0.9 % injection 10-40 mL  10-40 mL Intracatheter PRN Fritzi Mandes, MD   10 mL at 11/04/17 1151  . sodium chloride flush (NS) 0.9 % injection 10-40 mL  10-40 mL Intracatheter Q12H Fritzi Mandes, MD   10 mL at 11/05/17 1000  . sodium chloride flush (NS) 0.9 % injection 10-40 mL  10-40 mL Intracatheter PRN Fritzi Mandes, MD        Musculoskeletal: Strength & Muscle Tone: decreased and atrophy Gait & Station: unable to stand Patient leans: N/A  Psychiatric Specialty Exam: Physical Exam  Constitutional: She appears well-developed and well-nourished.  HENT:  Head: Normocephalic and atraumatic.   Eyes: Pupils are equal, round, and reactive to light. Conjunctivae are normal.  Neck: Normal range of motion.  Cardiovascular: Normal heart sounds.  Respiratory:  Patient is on a tracheostomy on a ventilator  GI: Soft.  Musculoskeletal: Normal range of motion. She exhibits edema.  Neurological: She is alert. She exhibits abnormal muscle tone.  Skin: Skin is warm and dry.  Psychiatric: Her affect is blunt.    Review of Systems  Unable to perform ROS: Intubated    Blood pressure 127/82, pulse (!) 114, temperature 99 F (37.2 C), temperature source Axillary, resp. rate 19, height 4' 9"  (1.448 m), weight 62 kg (136 lb 11 oz), last menstrual period 03/06/2005, SpO2 96 %.Body mass index is 29.58 kg/m.  General Appearance: Casual  Eye Contact:  None  Speech:  Negative  Volume:  Decreased  Mood:  Negative  Affect:  Negative  Thought Process:  NA  Orientation:  Negative  Thought Content:  Negative  Suicidal Thoughts:  No  Homicidal Thoughts:  No  Memory:  Negative  Judgement:  Negative  Insight:  Negative  Psychomotor Activity:  Negative  Concentration:  Concentration: Poor  Recall:  Negative  Fund of Knowledge:  Negative  Language:  Negative  Akathisia:  Negative  Handed:  Right  AIMS (if indicated):     Assets:  Social Support  ADL's:  Impaired  Cognition:  Impaired,  Severe  Sleep:        Treatment Plan Summary: Medication management and Plan Follow-up for this 57 year old woman with anxiety.  I was asked to reconsider for optimization of medication.  Patient is currently receiving 1 mg of clonazepam 4 times a day which is already a pretty high dose.  There really is not much likely benefit from increasing the total dose.  Instead I have rearranged the dose by changing the order to every 6 hours rather than 4 times a day which will keep the amount of clonazepam spread out more evenly through a 24-hour period.  I have also increased her Seroquel to 50 mg twice a day  in hope  that that will keep the anxiety a little better controlled.  There is no indication on going up on the Prozac and adding extra medicines would be unlikely to add much benefit.  This is a difficult situation where the patient is really not able to engage in any kind of therapy to help get her anxiety under control.  This is about as much as I think we can safely do right now with the medication.  I will still follow as needed.  Disposition: Patient does not meet criteria for psychiatric inpatient admission. Supportive therapy provided about ongoing stressors.  Alethia Berthold, MD 11/05/2017 7:03 PM

## 2017-11-05 NOTE — Progress Notes (Signed)
   11/05/17 1925  Clinical Encounter Type  Visited With Family  Visit Type Follow-up  Spiritual Encounters  Spiritual Needs Emotional   While chaplain was rounding on unit, patient family member called chaplain into the room.  Chaplain utilized active and reflective listening while offering emotional support to family member, patient sleeping in bed.  Encouraged family to page chaplain as needed.

## 2017-11-05 NOTE — Progress Notes (Signed)
Pharmacy Antibiotic Note  Michelle [name] is a 57 y.o. female admitted on 09/30/2017 with wound infx.  Pharmacy has been consulted for cefepime dosing.  Plan: Will start patient on cefepime 1g IV q12h  Height: 4\' 9"  (144.8 cm) Weight: 133 lb 6.1 oz (60.5 kg) IBW/kg (Calculated) : 38.6  Temp (24hrs), Avg:99.9 F (37.7 C), Min:98.5 F (36.9 C), Max:101.6 F (38.7 C)  Recent Labs  Lab 10/30/17 1422 10/31/17 0412 11/01/17 0548 11/01/17 1320 11/02/17 0502 11/03/17 0556 11/04/17 0456  WBC 6.6  --  4.7  --  4.4 5.1 5.2  CREATININE  --  <0.30*  --  <0.30* <0.30* <0.30* <0.30*  LATICACIDVEN  --   --   --  1.2  --   --   --     CrCl cannot be calculated (This lab value cannot be used to calculate CrCl because it is not a number: <0.30).    Allergies  Allergen Reactions  . Carvedilol Other (See Comments)    Pt gets "sick" when on medication    Thank you for allowing pharmacy to be a part of this patient's care.  Tobie Lords, PharmD, BCPS Clinical Pharmacist 11/05/2017

## 2017-11-06 ENCOUNTER — Inpatient Hospital Stay: Payer: Medicaid Other

## 2017-11-06 LAB — CULTURE, RESPIRATORY

## 2017-11-06 LAB — GLUCOSE, CAPILLARY
GLUCOSE-CAPILLARY: 130 mg/dL — AB (ref 65–99)
Glucose-Capillary: 122 mg/dL — ABNORMAL HIGH (ref 65–99)
Glucose-Capillary: 124 mg/dL — ABNORMAL HIGH (ref 65–99)
Glucose-Capillary: 127 mg/dL — ABNORMAL HIGH (ref 65–99)
Glucose-Capillary: 131 mg/dL — ABNORMAL HIGH (ref 65–99)
Glucose-Capillary: 145 mg/dL — ABNORMAL HIGH (ref 65–99)

## 2017-11-06 LAB — CULTURE, BLOOD (ROUTINE X 2)
Culture: NO GROWTH
Culture: NO GROWTH
Special Requests: ADEQUATE

## 2017-11-06 LAB — CULTURE, RESPIRATORY W GRAM STAIN

## 2017-11-06 MED ORDER — LORAZEPAM 1 MG PO TABS
1.0000 mg | ORAL_TABLET | Freq: Four times a day (QID) | ORAL | Status: DC | PRN
  Administered 2017-11-07 – 2017-11-08 (×2): 1 mg
  Filled 2017-11-06 (×2): qty 1

## 2017-11-06 MED ORDER — OXYCODONE HCL 5 MG/5ML PO SOLN
2.5000 mg | Freq: Four times a day (QID) | ORAL | Status: DC | PRN
  Administered 2017-11-07 – 2017-11-12 (×5): 2.5 mg via ORAL
  Filled 2017-11-06 (×5): qty 5

## 2017-11-06 MED ORDER — PRO-STAT SUGAR FREE PO LIQD
30.0000 mL | Freq: Every day | ORAL | Status: DC
  Administered 2017-11-06 – 2017-11-17 (×12): 30 mL via ORAL

## 2017-11-06 MED ORDER — OXYCODONE HCL 5 MG/5ML PO SOLN
2.5000 mg | Freq: Four times a day (QID) | ORAL | Status: DC
Start: 2017-11-06 — End: 2017-11-06

## 2017-11-06 MED ORDER — LEVETIRACETAM 100 MG/ML PO SOLN
500.0000 mg | Freq: Two times a day (BID) | ORAL | Status: DC
  Administered 2017-11-06 – 2017-11-11 (×10): 500 mg
  Filled 2017-11-06 (×11): qty 5

## 2017-11-06 MED ORDER — VITAL 1.5 CAL PO LIQD
1000.0000 mL | ORAL | Status: DC
  Administered 2017-11-06 – 2017-11-16 (×12): 1000 mL

## 2017-11-06 NOTE — Progress Notes (Signed)
Nutrition Follow-up  DOCUMENTATION CODES:   Non-severe (moderate) malnutrition in context of chronic illness  INTERVENTION:  Initiate new goal TF regimen of Vital 1.5 at 35 mL/hr (840 mL goal daily volume) + Pro-Stat 30 mL once daily via G-tube. Provides 1360 kcal, 72 grams of protein, 638 mL H2O daily.  With free water flush of 100 mL Q8hrs patient will receive a total of 938 mL H2O daily including water in tube feeding.  Continue MVI daily per tube.  Continue Juven BID per tube, each supplement provides 80 kcal, 14 grams of amino acids, and vitamins/minerals essential for wound healing.  NUTRITION DIAGNOSIS:   Moderate Malnutrition related to chronic illness(COPD, CHF, hepatitis C, cocaine abuse) as evidenced by mild fat depletion, moderate fat depletion, mild muscle depletion, moderate muscle depletion.  Ongoing - addressing with TF regimen.  GOAL:   Provide needs based on ASPEN/SCCM guidelines  Met with TF regimen.  MONITOR:   Vent status, Labs, Weight trends, TF tolerance, I & O's  REASON FOR ASSESSMENT:   Ventilator    ASSESSMENT:   57 year old female with PMHx of emphysema, COPD, HTN, hypercholesterolemia, hx pneumothorax s/p chest tube insertion, CAD s/p cardiac catheterization, cocaine abuse, tobacco abuse, asthma, CHF, anxiety, hepatitis C admitted with acute exacerbation of COPD, acute on chronic respiratory failure requiring intubation 4/29, PNA, cocaine intoxication, AKI due to renal hypoperfusion.   -Patient is s/p tracheostomy tube placement on 5/16. OGT was replaced with NGT. -Medications were adjusted by psychiatry on 5/28 to try to help with patient's anxiety. -Patient s/p placement of 20 Fr. Pull-through gastrostomy tube on 5/30 by IR.  Patient remains on mechanical ventilation through tracheostomy tube. In [facility] mode with FiO2 30% at time of RD assessment. Panic episodes have been less frequent. Patient worked with PT yesterday. Patient now has device  related pressure injury at tracheostomy site. Weight is trending back up. Noted moderate pitting edema. Will switch patient to Vital 1.5 Cal, which is more calorically dense.  Access: 20 Fr. G-tube placed on 5/30 by IR  TF: pt is tolerating Vital AF 1.2 at 45 mL/hr; per pump history patient received 1031 mL TF past 24 hrs (95.5% goal daily volume)  Patient is currently intubated on ventilator support Ve: 9.1 L/min Temp (24hrs), Avg:99.6 F (37.6 C), Min:98.3 F (36.8 C), Max:100.3 F (37.9 C)  Propofol: N/A  Medications reviewed and include: clonazepam per tube, famotidine per tube, free water flush 100 mL Q8hrs, Novolog 0-9 units Q4hrs, Keppra per tube, MVI daily per tube, Seroquel per tube, senna-docusate BID per tube.  Labs reviewed: CBG 109-131.  I/O: 2900 mL UOP yesterday (1.9 mL/kg/hr)  Weight trend: 63 kg on 6/3; +17.3 kg from admission  Discussed with RN and on rounds.  Diet Order:   Diet Order           Diet NPO time specified  Diet effective midnight          EDUCATION NEEDS:   No education needs have been identified at this time  Skin:  Skin Assessment: Skin Integrity Issues: Skin Integrity Issues:: Stage II, Incisions, Other (Comment) Stage II: sacrum Incisions: closed incision to neck Other: device related pressure injury, full thickness, to tracheostomy peristomal area (0.5cm x 0.4cm x 0.2cm)   Last BM:  11/04/2017 - small type 6  Height:   Ht Readings from Last 1 Encounters:  10/01/17 4' 9"  (1.448 m)    Weight:   Wt Readings from Last 1 Encounters:  11/06/17 138  lb 14.2 oz (63 kg)    Ideal Body Weight:  43.2 kg  BMI:  Body mass index is 30.06 kg/m.  Estimated Nutritional Needs:   Kcal:  1150-1370 (25-30 kcal/kg)  Protein:  70-80 grams (1.5-1.8 grams/kg)  Fluid:  1.4-1.6 L/day (30-35 mL/kg)  Willey Blade, MS, RD, LDN Office: [phone] Pager: [phone] After Hours/Weekend Pager: [phone]

## 2017-11-06 NOTE — Progress Notes (Signed)
[facility] [place] NAME: Michelle [name]    MR#:  [id number]  DATE OF BIRTH:  12/30/1960  SUBJECTIVE:  CHIEF COMPLAINT:   Chief Complaint  Patient presents with  . Shortness of Breath   Remains on the ventilator no significant change in the current condition  REVIEW OF SYSTEMS:  CONSTITUTIONAL: Unable to communicate  ROS  DRUG ALLERGIES:   Allergies  Allergen Reactions  . Carvedilol Other (See Comments)    Pt gets "sick" when on medication    VITALS:  Blood pressure 94/60, pulse (!) 104, temperature 99.7 F (37.6 C), temperature source Axillary, resp. rate (!) 29, height 4\' 9"  (1.448 m), weight 63 kg (138 lb 14.2 oz), last menstrual period 03/06/2005, SpO2 98 %.  PHYSICAL EXAMINATION:  GENERAL:  57 y.o.-year-old patient lying in the bed with no acute distress.  EYES: Pupils equal, round, reactive to light and accommodation. No scleral icterus. Extraocular muscles intact.  HEENT: Head atraumatic, normocephalic. Oropharynx and nasopharynx clear.  NECK:  Supple, no jugular venous distention. No thyroid enlargement, no tenderness.  LUNGS: Normal breath sounds bilaterally, no wheezing, rales,rhonchi or crepitation. No use of accessory muscles of respiration.  CARDIOVASCULAR: S1, S2 normal. No murmurs, rubs, or gallops.  ABDOMEN: Soft, nontender, nondistended. Bowel sounds present. No organomegaly or mass.  EXTREMITIES: No pedal edema, cyanosis, or clubbing.  NEUROLOGIC: Patient sedated PSYCHIATRIC: Sedated SKIN: No obvious rash, lesion, or ulcer.   Physical Exam LABORATORY PANEL:   CBC Recent Labs  Lab 11/05/17 0502  WBC 5.3  HGB 8.2*  HCT 24.6*  PLT 231   ------------------------------------------------------------------------------------------------------------------  Chemistries  Recent Labs  Lab 11/04/17 0456  NA 137  K 3.3*  CL 103  CO2 28  GLUCOSE 148*  BUN 20  CREATININE <0.30*  CALCIUM 8.2*  MG 1.6*  AST  17  ALT 24  ALKPHOS 97  BILITOT 0.4   ------------------------------------------------------------------------------------------------------------------  Cardiac Enzymes No results for input(s): TROPONINI in the last 168 hours. ------------------------------------------------------------------------------------------------------------------  RADIOLOGY:  Dg Chest Port 1 View  Result Date: 11/06/2017 CLINICAL DATA:  Respiratory failure. EXAM: PORTABLE CHEST 1 VIEW COMPARISON:  11/02/2017 and prior radiographs FINDINGS: A tracheostomy tube and RIGHT PICC line with tip overlying the mid SVC again noted. UPPER limits normal heart size again identified. Bilateral interstitial opacities appear slightly increased and may represent interstitial edema. Trace bilateral pleural effusions are present. No pneumothorax noted. IMPRESSION: Mild bilateral interstitial opacities which appear slightly increased and may represent mild interstitial edema. Trace bilateral pleural effusions. Electronically Signed   By: Margarette Canada M.D.   On: 11/06/2017 10:29    ASSESSMENT AND PLAN:  *Acute on chronic hypoxic respiratory failure  Stable s/p trach placement Oct 17, 2017 Secondary to COPD exacerbation Difficult weaning off the vent, continue CPAP trials and weaning as tolerated  *Acute on COPD exacerbation Continue inhaled corticosteroids, BTs prn, supplemental oxygen/ventilator weaning as tolerated Slow to improve  *Chronic benign essential hypertension Controlled on current regiment  *Acute on chronic general anxiety disorder, unspecified Continue current psychotropic regiment  Psychiatry input greatly appreciated   *Chronic stage II sacral decubitus wound Stable continue local wound care  *Dysphagia status post PEG tube placement  All the records are reviewed and case discussed with Care Management/Social Workerr. Management plans discussed with the patient, family and they are in  agreement.  CODE STATUS: partial  TOTAL TIME TAKING CARE OF THIS PATIENT: 35 minutes.        Winona Sison Posey Pronto  M.D on 11/06/2017   Between 7am to 6pm - Pager - [phone]  After 6pm go to [email or web address] - password [email or web address] [facility]  Office  [phone]  CC: Primary care physician; Center, [facility]  Note: This dictation was prepared with Dragon dictation along with smaller phrase technology. Any transcriptional errors that result from this process are unintentional.

## 2017-11-06 NOTE — Progress Notes (Signed)
Name: Michelle [name] MRN: [id number] DOB: 1961-05-18     CONSULTATION DATE: 09/30/2017 Objectives and subjective: Patient continues to be on the ventilator, tolerating tube feeding, infrequent panic episodes requiring PRN benzodiazepine.patient also having episodes of plugging, and desaturations.  PAST MEDICAL HISTORY :   has a past medical history of Allergy, Anxiety, Asthma, CHF (congestive heart failure) ([place]), Cocaine abuse ([place]), COPD (chronic obstructive pulmonary disease) ([place]), Coronary artery disease, Emphysema/COPD ([place]), Hepatitis C, High cholesterol, Hypertension, Pneumothorax, and Tobacco abuse.  has a past surgical history that includes Cardiac catheterization; Chest tube insertion; Tracheostomy tube placement (N/A, 10/17/2017); and IR GASTROSTOMY TUBE MOD SED (10/31/2017). Prior to Admission medications   Medication Sig Start Date End Date Taking? Authorizing Provider  albuterol (PROVENTIL HFA;VENTOLIN HFA) 108 (90 Base) MCG/ACT inhaler Inhale 2 puffs into the lungs every 6 (six) hours as needed for wheezing or shortness of breath. 02/26/17  Yes Dustin Flock, MD  ALPRAZolam Duanne Moron) 0.5 MG tablet Take 1 tablet (0.5 mg total) by mouth 3 (three) times daily as needed for anxiety. 08/24/17  Yes Bettey Costa, MD  aspirin EC 81 MG tablet Take 81 mg by mouth daily.   Yes [provider]  ipratropium-albuterol (DUONEB) 0.5-2.5 (3) MG/3ML SOLN Take 3 mLs by nebulization every 6 (six) hours as needed. 01/04/17  Yes [name], MD  mirtazapine (REMERON) 30 MG tablet Take 30 mg by mouth at bedtime.   Yes [provider]  predniSONE (DELTASONE) 10 MG tablet Take 1 tablet (10 mg total) by mouth daily with breakfast. 08/24/17  Yes Mody, Sital, MD  SYMBICORT 160-4.5 MCG/ACT inhaler Take 2 puffs by mouth 2 (two) times daily.   Yes [provider]  tiotropium (SPIRIVA) 18 MCG inhalation capsule Place 1 capsule (18 mcg total) into inhaler and inhale daily. 01/04/17   Yes [name], MD  carvedilol (COREG) 3.125 MG tablet Take 1 tablet (3.125 mg total) by mouth every morning. Patient not taking: Reported on 09/30/2017 02/10/16   Gladstone Lighter, MD  enalapril (VASOTEC) 10 MG tablet Take 0.5 tablets (5 mg total) by mouth daily. Patient not taking: Reported on 09/30/2017 02/10/16   Gladstone Lighter, MD  feeding supplement, ENSURE ENLIVE, (ENSURE ENLIVE) LIQD Take 237 mLs by mouth 2 (two) times daily between meals. 08/24/17   Bettey Costa, MD  guaiFENesin-codeine 100-10 MG/5ML syrup Take 10 mLs by mouth every 4 (four) hours as needed for cough. Patient not taking: Reported on 08/20/2017 07/02/17   Demetrios Loll, MD  levofloxacin (LEVAQUIN) 750 MG tablet Take 1 tablet (750 mg total) by mouth daily. Patient not taking: Reported on 09/30/2017 08/24/17   Bettey Costa, MD  polyethylene glycol (GOLYTELY) 236 g solution Drink one 8 oz glass every 20 mins until stools are clear starting at 5:00pm on 08/14/17 Patient not taking: Reported on 09/30/2017 07/26/17   Lucilla Lame, MD   Allergies  Allergen Reactions  . Carvedilol Other (See Comments)    Pt gets "sick" when on medication    FAMILY HISTORY:  family history includes Asthma in her mother; Breast cancer (age of onset: 25) in her maternal aunt; CAD in her father; COPD in her sister; CVA in her father; Lung cancer in her mother; Stroke in her father. SOCIAL HISTORY:  reports that she has been smoking cigarettes.  She has a 4.10 pack-year smoking history. She has never used smokeless tobacco. She reports that she has current or past drug history. Drug: "Crack" cocaine. She reports that she does not drink alcohol.  REVIEW OF SYSTEMS:   Unable to obtain due to critical illness   VITAL SIGNS: Temp:  [98.3 F (36.8 C)-100.3 F (37.9 C)] 99.3 F (37.4 C) (06/05 0400) Pulse Rate:  [91-128] 109 (06/05 0715) Resp:  [14-37] 30 (06/05 0715) BP: (84-195)/(54-131) 117/73 (06/05 0715) SpO2:  [93 %-100 %] 100 % (06/05  0825) FiO2 (%):  [30 %] 30 % (06/05 0825) Weight:  [138 lb 14.2 oz (63 kg)] 138 lb 14.2 oz (63 kg) (06/05 0438)  Physical Examination:  Sedated s/p versed. Trach in place, on vent, bilateral air entry is equal with no adventitious sound. S1 & S2 are audible with no murmur Benign abdomen was normal peristalsis Sacral decub stage II. Anasarca 1+  ASSESSMENT / PLAN: Acute on chronic respiratory failure status post placement of tracheostomy on 5/16/2019with ventilator dependency. Continues to have poor weaning parameters. Patient with episodes of mucus plugging, agitation, ventilator patient dyssynchrony, will obtain respiratory culture, chest x-ray today will also consult palliative care as I do not feel patient will be able to be weaned off of mechanical ventilation  COPD with frequent exacerbations and reactive airway disease. Bronchial dilators + inhaled steroids  Anxiety and panic attacks(improved).MRI of the brain 5/23 did not show any acute intracranial abnormalities. Klonapin + Paxil + Seroquel + Respirable+ Ativan PRN. Psychiatry consulted to help optimize medication  HTN.Echo in 2017 LVEF 55 to 60% Optimize antihypertensives and monitor hemodynamics.  On Keppra  Sacral decub stage II, Wound care  Deconditioning -Supportive care  Dysphagias/p Placement of PEG on 10/31/2017  Follow with case management for placement  Full code  DVT &GI prophylaxis. Continue with supportive care  [name], DO  Patient ID: Michelle [name], female   DOB: 1960/11/06, 57 y.o.   MRN: [id number] Patient ID: Michelle [name], female   DOB: 12-27-1960, 57 y.o.   MRN: [id number] Patient ID: Michelle [name], female   DOB: 08-02-1960, 57 y.o.   MRN: [id number]

## 2017-11-07 LAB — BASIC METABOLIC PANEL
Anion gap: 6 (ref 5–15)
BUN: 26 mg/dL — AB (ref 6–20)
CALCIUM: 8.5 mg/dL — AB (ref 8.9–10.3)
CO2: 31 mmol/L (ref 22–32)
Chloride: 100 mmol/L — ABNORMAL LOW (ref 101–111)
Creatinine, Ser: <0.3 mg/dL — ABNORMAL LOW (ref 0.44–1.00)
GLUCOSE: 154 mg/dL — AB (ref 65–99)
Potassium: 3.5 mmol/L (ref 3.5–5.1)
Sodium: 137 mmol/L (ref 135–145)

## 2017-11-07 LAB — CBC WITH DIFFERENTIAL/PLATELET
BASOS ABS: 0 10*3/uL (ref 0–0.1)
BASOS PCT: 0 %
EOS ABS: 0 10*3/uL (ref 0–0.7)
EOS PCT: 0 %
HCT: 24.4 % — ABNORMAL LOW (ref 35.0–47.0)
Hemoglobin: 7.9 g/dL — ABNORMAL LOW (ref 12.0–16.0)
Lymphocytes Relative: 11 %
Lymphs Abs: 0.6 10*3/uL — ABNORMAL LOW (ref 1.0–3.6)
MCH: 28.4 pg (ref 26.0–34.0)
MCHC: 32.3 g/dL (ref 32.0–36.0)
MCV: 88.1 fL (ref 80.0–100.0)
Monocytes Absolute: 0.5 10*3/uL (ref 0.2–0.9)
Monocytes Relative: 9 %
Neutro Abs: 4.4 10*3/uL (ref 1.4–6.5)
Neutrophils Relative %: 80 %
PLATELETS: 282 10*3/uL (ref 150–440)
RBC: 2.77 MIL/uL — ABNORMAL LOW (ref 3.80–5.20)
RDW: 15.9 % — AB (ref 11.5–14.5)
WBC: 5.5 10*3/uL (ref 3.6–11.0)

## 2017-11-07 LAB — GLUCOSE, CAPILLARY
GLUCOSE-CAPILLARY: 168 mg/dL — AB (ref 65–99)
GLUCOSE-CAPILLARY: 288 mg/dL — AB (ref 65–99)
Glucose-Capillary: 140 mg/dL — ABNORMAL HIGH (ref 65–99)
Glucose-Capillary: 173 mg/dL — ABNORMAL HIGH (ref 65–99)
Glucose-Capillary: 167 mg/dL — ABNORMAL HIGH (ref 65–99)

## 2017-11-07 MED ORDER — CLONAZEPAM 1 MG PO TABS: 1.0000 mg | ORAL_TABLET | Freq: Every day | ORAL | Status: DC | PRN

## 2017-11-07 MED ORDER — SODIUM CHLORIDE 0.9 % IV BOLUS
1000.0000 mL | Freq: Once | INTRAVENOUS | Status: AC
  Administered 2017-11-07: 1000 mL via INTRAVENOUS

## 2017-11-07 MED ORDER — CLONAZEPAM 1 MG PO TABS
1.0000 mg | ORAL_TABLET | Freq: Two times a day (BID) | ORAL | Status: DC
  Administered 2017-11-07 – 2017-11-11 (×9): 1 mg via ORAL
  Filled 2017-11-07 (×8): qty 1

## 2017-11-07 MED ORDER — QUETIAPINE FUMARATE 100 MG PO TABS
100.0000 mg | ORAL_TABLET | Freq: Two times a day (BID) | ORAL | Status: DC
  Administered 2017-11-07 – 2017-11-08 (×3): 100 mg
  Filled 2017-11-07 (×3): qty 1

## 2017-11-07 NOTE — Clinical Social Work Note (Signed)
Patient has received morphine IV doses yesterday as well as ativan IV doses yesterday. CSW has noted that it appears now that they have both been discontinued as of yesterday evening. CSW will wait to see if able to stay off IV sedatives and pain meds for at least 4 days. In addition, family supposedly has a meeting on Saturday to discuss goals of care again. Shela Leff MSW,LCSW [phone]

## 2017-11-07 NOTE — Progress Notes (Signed)
Patient appeared to be having anxiety this am. BP 200's and increased heart rate. Klonopin given and hydralazine. Patient has shallow/ rapid breathing pattern. Hinton Dyer PA spoke with sister this am. Dr Jefferson Fuel in to evaluate patient.

## 2017-11-07 NOTE — Progress Notes (Signed)
[facility] [place] NAME: Michelle [name]    MR#:  [id number]  DATE OF BIRTH:  1960/11/11  SUBJECTIVE:  CHIEF COMPLAINT:   Chief Complaint  Patient presents with  . Shortness of Breath   Patient sedated on the ventilator  REVIEW OF SYSTEMS:  CONSTITUTIONAL: Unable to communicate  ROS  DRUG ALLERGIES:   Allergies  Allergen Reactions  . Carvedilol Other (See Comments)    Pt gets "sick" when on medication    VITALS:  Blood pressure (!) 81/61, pulse (!) 102, temperature 99.4 F (37.4 C), resp. rate 19, height 4\' 9"  (1.448 m), weight 55.4 kg (122 lb 2.2 oz), last menstrual period 03/06/2005, SpO2 98 %.  PHYSICAL EXAMINATION:  GENERAL:  57 y.o.-year-old patient lying in the bed with no acute distress.  EYES: Pupils equal, round, reactive to light and accommodation. No scleral icterus. Extraocular muscles intact.  HEENT: Head atraumatic, normocephalic. Oropharynx and nasopharynx clear.  NECK:  Supple, no jugular venous distention. No thyroid enlargement, no tenderness.  LUNGS: Normal breath sounds bilaterally, no wheezing, rales,rhonchi or crepitation. No use of accessory muscles of respiration.  CARDIOVASCULAR: S1, S2 normal. No murmurs, rubs, or gallops.  ABDOMEN: Soft, nontender, nondistended. Bowel sounds present. No organomegaly or mass.  EXTREMITIES: No pedal edema, cyanosis, or clubbing.  NEUROLOGIC: Patient sedated PSYCHIATRIC: Sedated SKIN: No obvious rash, lesion, or ulcer.   Physical Exam LABORATORY PANEL:   CBC Recent Labs  Lab 11/07/17 0619  WBC 5.5  HGB 7.9*  HCT 24.4*  PLT 282   ------------------------------------------------------------------------------------------------------------------  Chemistries  Recent Labs  Lab 11/04/17 0456 11/07/17 0619  NA 137 137  K 3.3* 3.5  CL 103 100*  CO2 28 31  GLUCOSE 148* 154*  BUN 20 26*  CREATININE <0.30* <0.30*  CALCIUM 8.2* 8.5*  MG 1.6*  --   AST 17  --    ALT 24  --   ALKPHOS 97  --   BILITOT 0.4  --    ------------------------------------------------------------------------------------------------------------------  Cardiac Enzymes No results for input(s): TROPONINI in the last 168 hours. ------------------------------------------------------------------------------------------------------------------  RADIOLOGY:  Dg Chest Port 1 View  Result Date: 11/06/2017 CLINICAL DATA:  Respiratory failure. EXAM: PORTABLE CHEST 1 VIEW COMPARISON:  11/02/2017 and prior radiographs FINDINGS: A tracheostomy tube and RIGHT PICC line with tip overlying the mid SVC again noted. UPPER limits normal heart size again identified. Bilateral interstitial opacities appear slightly increased and may represent interstitial edema. Trace bilateral pleural effusions are present. No pneumothorax noted. IMPRESSION: Mild bilateral interstitial opacities which appear slightly increased and may represent mild interstitial edema. Trace bilateral pleural effusions. Electronically Signed   By: Margarette Canada M.D.   On: 11/06/2017 10:29    ASSESSMENT AND PLAN:  *Acute on chronic hypoxic respiratory failure  Stable s/p trach placement Oct 17, 2017 Secondary to COPD exacerbation Difficult weaning off the vent, continue CPAP trials and weaning as tolerated  *Acute on COPD exacerbation Continue inhaled corticosteroids, BTs prn, supplemental oxygen/ventilator weaning as tolerated Slow to improve  *Chronic benign essential hypertension Controlled on current regiment  *Acute on chronic general anxiety disorder, unspecified Continue current psychotropic regiment  Psychiatry input greatly appreciated   *Chronic stage II sacral decubitus wound Stable continue local wound care  *Dysphagia status post PEG tube placement  All the records are reviewed and case discussed with Care Management/Social Workerr. Management plans discussed with the patient, family and they are in  agreement.  CODE STATUS: partial  TOTAL TIME  TAKING CARE OF THIS PATIENT: 35 minutes.        Dustin Flock M.D on 11/07/2017   Between 7am to 6pm - Pager - [phone]  After 6pm go to [email or web address] - password [email or web address] [facility]  Office  [phone]  CC: Primary care physician; Center, [facility]  Note: This dictation was prepared with Dragon dictation along with smaller phrase technology. Any transcriptional errors that result from this process are unintentional.

## 2017-11-07 NOTE — Progress Notes (Signed)
Name: Michelle [name] MRN: [id number] DOB: 1960/09/02     CONSULTATION DATE: 09/30/2017 Objectives and subjective: Patient continues to be on the ventilator, tolerating tube feeding, infrequent panic episodes requiring PRN benzodiazepine.patient also having episodes of plugging, and desaturations.  PAST MEDICAL HISTORY :   has a past medical history of Allergy, Anxiety, Asthma, CHF (congestive heart failure) ([place]), Cocaine abuse ([place]), COPD (chronic obstructive pulmonary disease) ([place]), Coronary artery disease, Emphysema/COPD ([place]), Hepatitis C, High cholesterol, Hypertension, Pneumothorax, and Tobacco abuse.  has a past surgical history that includes Cardiac catheterization; Chest tube insertion; Tracheostomy tube placement (N/A, 10/17/2017); and IR GASTROSTOMY TUBE MOD SED (10/31/2017). Prior to Admission medications   Medication Sig Start Date End Date Taking? Authorizing Provider  albuterol (PROVENTIL HFA;VENTOLIN HFA) 108 (90 Base) MCG/ACT inhaler Inhale 2 puffs into the lungs every 6 (six) hours as needed for wheezing or shortness of breath. 02/26/17  Yes Dustin Flock, MD  ALPRAZolam Duanne Moron) 0.5 MG tablet Take 1 tablet (0.5 mg total) by mouth 3 (three) times daily as needed for anxiety. 08/24/17  Yes Bettey Costa, MD  aspirin EC 81 MG tablet Take 81 mg by mouth daily.   Yes [provider]  ipratropium-albuterol (DUONEB) 0.5-2.5 (3) MG/3ML SOLN Take 3 mLs by nebulization every 6 (six) hours as needed. 01/04/17  Yes [name], MD  mirtazapine (REMERON) 30 MG tablet Take 30 mg by mouth at bedtime.   Yes [provider]  predniSONE (DELTASONE) 10 MG tablet Take 1 tablet (10 mg total) by mouth daily with breakfast. 08/24/17  Yes Mody, Sital, MD  SYMBICORT 160-4.5 MCG/ACT inhaler Take 2 puffs by mouth 2 (two) times daily.   Yes [provider]  tiotropium (SPIRIVA) 18 MCG inhalation capsule Place 1 capsule (18 mcg total) into inhaler and inhale daily. 01/04/17   Yes [name], MD  carvedilol (COREG) 3.125 MG tablet Take 1 tablet (3.125 mg total) by mouth every morning. Patient not taking: Reported on 09/30/2017 02/10/16   Gladstone Lighter, MD  enalapril (VASOTEC) 10 MG tablet Take 0.5 tablets (5 mg total) by mouth daily. Patient not taking: Reported on 09/30/2017 02/10/16   Gladstone Lighter, MD  feeding supplement, ENSURE ENLIVE, (ENSURE ENLIVE) LIQD Take 237 mLs by mouth 2 (two) times daily between meals. 08/24/17   Bettey Costa, MD  guaiFENesin-codeine 100-10 MG/5ML syrup Take 10 mLs by mouth every 4 (four) hours as needed for cough. Patient not taking: Reported on 08/20/2017 07/02/17   Demetrios Loll, MD  levofloxacin (LEVAQUIN) 750 MG tablet Take 1 tablet (750 mg total) by mouth daily. Patient not taking: Reported on 09/30/2017 08/24/17   Bettey Costa, MD  polyethylene glycol (GOLYTELY) 236 g solution Drink one 8 oz glass every 20 mins until stools are clear starting at 5:00pm on 08/14/17 Patient not taking: Reported on 09/30/2017 07/26/17   Lucilla Lame, MD   Allergies  Allergen Reactions  . Carvedilol Other (See Comments)    Pt gets "sick" when on medication    FAMILY HISTORY:  family history includes Asthma in her mother; Breast cancer (age of onset: 96) in her maternal aunt; CAD in her father; COPD in her sister; CVA in her father; Lung cancer in her mother; Stroke in her father. SOCIAL HISTORY:  reports that she has been smoking cigarettes.  She has a 4.10 pack-year smoking history. She has never used smokeless tobacco. She reports that she has current or past drug history. Drug: "Crack" cocaine. She reports that she does not drink alcohol.  REVIEW OF SYSTEMS:   Unable to obtain due to critical illness   VITAL SIGNS: Temp:  [99.2 F (37.3 C)-100.3 F (37.9 C)] 100.3 F (37.9 C) (06/06 0400) Pulse Rate:  [71-138] 116 (06/06 0845) Resp:  [17-38] 28 (06/06 0845) BP: (73-233)/(45-130) 128/72 (06/06 0845) SpO2:  [92 %-100 %] 92 % (06/06  0845) FiO2 (%):  [30 %] 30 % (06/06 0422) Weight:  [122 lb 2.2 oz (55.4 kg)] 122 lb 2.2 oz (55.4 kg) (06/06 0427)  Physical Examination:  Sedated s/p versed. Trach in place, on vent, bilateral air entry is equal with no adventitious sound. S1 & S2 are audible with no murmur Benign abdomen was normal peristalsis Sacral decub stage II. Anasarca 1+  ASSESSMENT / PLAN: Acute on chronic respiratory failure status post placement of tracheostomy on 5/16/2019with ventilator dependency. Continues to have poor weaning parameters. Chest x-ray performed yesterday did not reveal any evidence of pneumonia, fungal contaminant  COPD with frequent exacerbations and reactive airway disease. Bronchial dilators + inhaled steroids  Anemia. 11 7.9, no evidence of active bleeding  Anxiety and panic attacks(improved).MRI of the brain 5/23 did not show any acute intracranial abnormalities. Klonapin + Paxil + Seroquel + Respirable+ Ativan PRN. Psychiatry consulted to help optimize medication  HTN.Echo in 2017 LVEF 55 to 60% Optimize antihypertensives and monitor hemodynamics.  On Keppra  Sacral decub stage II, Wound care  Deconditioning -Supportive care  Dysphagias/p Placement of PEG on 10/31/2017  Follow with case management for placement  Full code  DVT &GI prophylaxis. Continue with supportive care  [name], DO  Patient ID: Michelle [name], female   DOB: November 16, 1960, 57 y.o.   MRN: [id number] Patient ID: Michelle [name], female   DOB: 17-Jun-1960, 57 y.o.   MRN: [id number] Patient ID: Michelle [name], female   DOB: [birth year], 57 y.o.   MRN: [id number] Patient ID: Michelle [name], female   DOB: 1961-04-22, 57 y.o.   MRN: [id number]

## 2017-11-07 NOTE — Progress Notes (Signed)
Spoke with Dr Jefferson Fuel regarding patients bp 76/52. Orders for 1 fluids bolus.

## 2017-11-07 NOTE — Progress Notes (Signed)
PT Cancellation Note  Patient Details Name: Michelle [name] MRN: [id number] DOB: Jul 19, 1960   Cancelled Treatment:    Reason Eval/Treat Not Completed: Patient not medically ready. Re-evaluation consult noted, however per RN, pt is not appropriate as she was very anxious this morning with abnormal vitals. Will hold at this time and re-attempt next date if medically stable.   Laszlo Ellerby 11/07/2017, 11:35 AM Greggory Stallion, PT, DPT [phone]

## 2017-11-07 NOTE — Progress Notes (Signed)
Patient received ativan PO due to agitation, shallow, rapid breathing, increased heart rate.

## 2017-11-08 LAB — GLUCOSE, CAPILLARY
GLUCOSE-CAPILLARY: 108 mg/dL — AB (ref 65–99)
GLUCOSE-CAPILLARY: 130 mg/dL — AB (ref 65–99)
GLUCOSE-CAPILLARY: 141 mg/dL — AB (ref 65–99)
Glucose-Capillary: 147 mg/dL — ABNORMAL HIGH (ref 65–99)
Glucose-Capillary: 146 mg/dL — ABNORMAL HIGH (ref 65–99)
Glucose-Capillary: 153 mg/dL — ABNORMAL HIGH (ref 65–99)
Glucose-Capillary: 149 mg/dL — ABNORMAL HIGH (ref 65–99)
Glucose-Capillary: 124 mg/dL — ABNORMAL HIGH (ref 65–99)
Glucose-Capillary: 116 mg/dL — ABNORMAL HIGH (ref 65–99)

## 2017-11-08 MED ORDER — QUETIAPINE FUMARATE 100 MG PO TABS: 100.0000 mg | ORAL_TABLET | Freq: Every day | ORAL | Status: DC

## 2017-11-08 MED ORDER — QUETIAPINE FUMARATE 25 MG PO TABS: 50.0000 mg | ORAL_TABLET | Freq: Every day | ORAL | Status: DC

## 2017-11-08 MED ORDER — ACETAMINOPHEN 160 MG/5ML PO SOLN: 650.0000 mg | Freq: Four times a day (QID) | ORAL | Status: DC | PRN

## 2017-11-08 MED ORDER — ACETAMINOPHEN 160 MG/5ML PO SOLN
650.0000 mg | Freq: Four times a day (QID) | ORAL | Status: DC | PRN
  Administered 2017-11-16 – 2017-11-18 (×4): 650 mg via ORAL
  Filled 2017-11-08: qty 25
  Filled 2017-11-08: qty 20.3
  Filled 2017-11-08: qty 25
  Filled 2017-11-08: qty 20.3

## 2017-11-08 MED ORDER — ONDANSETRON HCL 4 MG/2ML IJ SOLN: 4.0000 mg | Freq: Once | INTRAMUSCULAR | Status: DC | PRN

## 2017-11-08 MED ORDER — FENTANYL CITRATE (PF) 100 MCG/2ML IJ SOLN: 25.0000 ug | INTRAMUSCULAR | Status: DC | PRN

## 2017-11-08 NOTE — Progress Notes (Signed)
[place] at [place] NAME: Michelle [name]    MR#:  [id number]  DATE OF BIRTH:  [birth year]  SUBJECTIVE:  CHIEF COMPLAINT:   Chief Complaint  Patient presents with  . Shortness of Breath   Patient sedated on the ventilator, intermittently anxiolytics  REVIEW OF SYSTEMS:  CONSTITUTIONAL: Unable to communicate  ROS  DRUG ALLERGIES:   Allergies  Allergen Reactions  . Carvedilol Other (See Comments)    Pt gets "sick" when on medication    VITALS:  Blood pressure 125/88, pulse 99, temperature 98.4 F (36.9 C), temperature source Axillary, resp. rate 18, height 4\' 9"  (1.448 m), weight 58.3 kg (128 lb 8.5 oz), last menstrual period 03/06/2005, SpO2 99 %.  PHYSICAL EXAMINATION:  GENERAL:  57 y.o.-year-old patient lying in the bed with no acute distress.  EYES: Pupils equal, round, reactive to light and accommodation. No scleral icterus. Extraocular muscles intact.  HEENT: Head atraumatic, normocephalic. Oropharynx and nasopharynx clear.  NECK:  Supple, no jugular venous distention. No thyroid enlargement, no tenderness.  LUNGS: Normal breath sounds bilaterally, no wheezing, rales,rhonchi or crepitation. No use of accessory muscles of respiration.  CARDIOVASCULAR: S1, S2 normal. No murmurs, rubs, or gallops.  ABDOMEN: Soft, nontender, nondistended. Bowel sounds present. No organomegaly or mass.  EXTREMITIES: No pedal edema, cyanosis, or clubbing.  NEUROLOGIC: Patient sedated PSYCHIATRIC: Sedated SKIN: No obvious rash, lesion, or ulcer.   Physical Exam LABORATORY PANEL:   CBC Recent Labs  Lab 11/07/17 0619  WBC 5.5  HGB 7.9*  HCT 24.4*  PLT 282   ------------------------------------------------------------------------------------------------------------------  Chemistries  Recent Labs  Lab 11/04/17 0456 11/07/17 0619  NA 137 137  K 3.3* 3.5  CL 103 100*  CO2 28 31  GLUCOSE 148* 154*  BUN 20 26*  CREATININE <0.30* <0.30*   CALCIUM 8.2* 8.5*  MG 1.6*  --   AST 17  --   ALT 24  --   ALKPHOS 97  --   BILITOT 0.4  --    ------------------------------------------------------------------------------------------------------------------  Cardiac Enzymes No results for input(s): TROPONINI in the last 168 hours. ------------------------------------------------------------------------------------------------------------------  RADIOLOGY:  No results found.  ASSESSMENT AND PLAN:  *Acute on chronic hypoxic respiratory failure  Stable s/p trach placement Oct 17, 2017 Secondary to COPD exacerbation Difficult weaning off the vent, continue CPAP trials and weaning as tolerated  *Acute on COPD exacerbation Continue inhaled corticosteroids, BTs prn, supplemental oxygen/ventilator weaning as tolerated Slow to improve  *Chronic benign essential hypertension Controlled on current regiment  *Acute on chronic general anxiety disorder, unspecified Continue current psychotropic regiment  Psychiatry input greatly appreciated   *Chronic stage II sacral decubitus wound Stable continue local wound care  *Dysphagia status post PEG tube placement  All the records are reviewed and case discussed with Care Management/Social Workerr. Management plans discussed with the patient, family and they are in agreement.  CODE STATUS: partial  TOTAL TIME TAKING CARE OF THIS PATIENT: 35 minutes.        Dustin Flock M.D on 11/08/2017   Between 7am to 6pm - Pager - [phone]  After 6pm go to [email or web address] - password [email or web address] [facility]  Office  [phone]  CC: Primary care physician; Center, [facility]  Note: This dictation was prepared with Dragon dictation along with smaller phrase technology. Any transcriptional errors that result from this process are unintentional.

## 2017-11-08 NOTE — Progress Notes (Signed)
PT Cancellation Note  Patient Details Name: Michelle [name] MRN: [id number] DOB: 1960-12-27   Cancelled Treatment:    Reason Eval/Treat Not Completed: Patient not medically ready. Per notes, currently sedated on vent. Discussed with RN, pt not appropriate for therapy this date. Will re-attempt last time tomorrow. May need to dc orders if unable to participate further.    Zissy Hamlett 11/08/2017, 2:49 PM Greggory Stallion, PT, DPT [phone]

## 2017-11-08 NOTE — Clinical Social Work Note (Signed)
CSW awaiting outcome family's meeting with physician tomorrow. Family states that this was arranged to discuss goals of care again. Shela Leff MSW,LCSW [phone]

## 2017-11-08 NOTE — Progress Notes (Signed)
Name: Michelle [name] MRN: [id number] DOB: 1961/04/04     CONSULTATION DATE: 09/30/2017 Objectives and subjective: Patient continues to be on the ventilator, tolerating tube feeding, infrequent panic episodes requiring PRN benzodiazepine.patient also having episodes of plugging, and desaturations.  PAST MEDICAL HISTORY :   has a past medical history of Allergy, Anxiety, Asthma, CHF (congestive heart failure) ([place]), Cocaine abuse ([place]), COPD (chronic obstructive pulmonary disease) ([place]), Coronary artery disease, Emphysema/COPD ([place]), Hepatitis C, High cholesterol, Hypertension, Pneumothorax, and Tobacco abuse.  has a past surgical history that includes Cardiac catheterization; Chest tube insertion; Tracheostomy tube placement (N/A, 10/17/2017); and IR GASTROSTOMY TUBE MOD SED (10/31/2017). Prior to Admission medications   Medication Sig Start Date End Date Taking? Authorizing Provider  albuterol (PROVENTIL HFA;VENTOLIN HFA) 108 (90 Base) MCG/ACT inhaler Inhale 2 puffs into the lungs every 6 (six) hours as needed for wheezing or shortness of breath. 02/26/17  Yes Dustin Flock, MD  ALPRAZolam Duanne Moron) 0.5 MG tablet Take 1 tablet (0.5 mg total) by mouth 3 (three) times daily as needed for anxiety. 08/24/17  Yes Bettey Costa, MD  aspirin EC 81 MG tablet Take 81 mg by mouth daily.   Yes [provider]  ipratropium-albuterol (DUONEB) 0.5-2.5 (3) MG/3ML SOLN Take 3 mLs by nebulization every 6 (six) hours as needed. 01/04/17  Yes [name], MD  mirtazapine (REMERON) 30 MG tablet Take 30 mg by mouth at bedtime.   Yes [provider]  predniSONE (DELTASONE) 10 MG tablet Take 1 tablet (10 mg total) by mouth daily with breakfast. 08/24/17  Yes Mody, Sital, MD  SYMBICORT 160-4.5 MCG/ACT inhaler Take 2 puffs by mouth 2 (two) times daily.   Yes [provider]  tiotropium (SPIRIVA) 18 MCG inhalation capsule Place 1 capsule (18 mcg total) into inhaler and inhale daily. 01/04/17   Yes [name], MD  carvedilol (COREG) 3.125 MG tablet Take 1 tablet (3.125 mg total) by mouth every morning. Patient not taking: Reported on 09/30/2017 02/10/16   Gladstone Lighter, MD  enalapril (VASOTEC) 10 MG tablet Take 0.5 tablets (5 mg total) by mouth daily. Patient not taking: Reported on 09/30/2017 02/10/16   Gladstone Lighter, MD  feeding supplement, ENSURE ENLIVE, (ENSURE ENLIVE) LIQD Take 237 mLs by mouth 2 (two) times daily between meals. 08/24/17   Bettey Costa, MD  guaiFENesin-codeine 100-10 MG/5ML syrup Take 10 mLs by mouth every 4 (four) hours as needed for cough. Patient not taking: Reported on 08/20/2017 07/02/17   Demetrios Loll, MD  levofloxacin (LEVAQUIN) 750 MG tablet Take 1 tablet (750 mg total) by mouth daily. Patient not taking: Reported on 09/30/2017 08/24/17   Bettey Costa, MD  polyethylene glycol (GOLYTELY) 236 g solution Drink one 8 oz glass every 20 mins until stools are clear starting at 5:00pm on 08/14/17 Patient not taking: Reported on 09/30/2017 07/26/17   Lucilla Lame, MD   Allergies  Allergen Reactions  . Carvedilol Other (See Comments)    Pt gets "sick" when on medication    FAMILY HISTORY:  family history includes Asthma in her mother; Breast cancer (age of onset: 17) in her maternal aunt; CAD in her father; COPD in her sister; CVA in her father; Lung cancer in her mother; Stroke in her father. SOCIAL HISTORY:  reports that she has been smoking cigarettes.  She has a 4.10 pack-year smoking history. She has never used smokeless tobacco. She reports that she has current or past drug history. Drug: "Crack" cocaine. She reports that she does not drink alcohol.  REVIEW OF SYSTEMS:   Unable to obtain due to critical illness   VITAL SIGNS: Temp:  [98.4 F (36.9 C)-102.1 F (38.9 C)] 98.4 F (36.9 C) (06/07 0400) Pulse Rate:  [86-131] 99 (06/07 0500) Resp:  [16-43] 18 (06/07 0700) BP: (75-220)/(53-106) 125/88 (06/07 0700) SpO2:  [90 %-100 %] 100 % (06/07  0715) FiO2 (%):  [30 %] 30 % (06/07 0715) Weight:  [58.3 kg (128 lb 8.5 oz)] 58.3 kg (128 lb 8.5 oz) (06/07 0500)  Physical Examination:  Sedated s/p versed. Trach in place, on vent, bilateral air entry is equal with no adventitious sound. S1 & S2 are audible with no murmur Benign abdomen was normal peristalsis Sacral decub stage II. Anasarca 1+  ASSESSMENT / PLAN: Acute on chronic respiratory failure status post placement of tracheostomy on 5/16/2019with ventilator dependency. Continues to have poor weaning parameters. Chest x-ray performed yesterday did not reveal any evidence of pneumonia, fungal contaminant  COPD with frequent exacerbations and reactive airway disease. Bronchial dilators + inhaled steroids  Anemia. 11 7.9, no evidence of active bleeding  Anxiety and panic attacks(improved).MRI of the brain 5/23 did not show any acute intracranial abnormalities. Klonapin + Paxil + Seroquel + Respirable+ Ativan PRN. Psychiatry consulted to help optimize medication  HTN.Echo in 2017 LVEF 55 to 60% Optimize antihypertensives and monitor hemodynamics.  On Keppra  Sacral decub stage II, Wound care  Deconditioning -Supportive care  Dysphagias/p Placement of PEG on 10/31/2017  Follow with case management for placement  Full code  DVT &GI prophylaxis. Continue with supportive care  [name], DO  Patient ID: SYANNA REMMERT, female   DOB: 1961/02/06, 57 y.o.   MRN: [id number] Patient ID: KATORI WIRSING, female   DOB: 22-Nov-1960, 57 y.o.   MRN: [id number] Patient ID: MARKEA RUZICH, female   DOB: 12-01-1960, 57 y.o.   MRN: [id number] Patient ID: KAYLIANNA DETERT, female   DOB: 1960/09/12, 57 y.o.   MRN: [id number]

## 2017-11-08 NOTE — Progress Notes (Signed)
Patient sustaining 02 SATs 88-89%.  Attempted to suction and repositioning.  Trach assessed with no noted changes.  Patient continues to be hypoxic.  [place], CP called and notified.  fi02 increased from 30% to 50%.  Improvement noted in SATs.

## 2017-11-09 LAB — GLUCOSE, CAPILLARY
GLUCOSE-CAPILLARY: 121 mg/dL — AB (ref 65–99)
GLUCOSE-CAPILLARY: 137 mg/dL — AB (ref 65–99)
GLUCOSE-CAPILLARY: 194 mg/dL — AB (ref 65–99)
GLUCOSE-CAPILLARY: 151 mg/dL — AB (ref 65–99)
Glucose-Capillary: 123 mg/dL — ABNORMAL HIGH (ref 65–99)
Glucose-Capillary: 102 mg/dL — ABNORMAL HIGH (ref 65–99)

## 2017-11-09 MED ORDER — METHYLPREDNISOLONE SODIUM SUCC 125 MG IJ SOLR
125.0000 mg | Freq: Once | INTRAMUSCULAR | Status: AC
  Administered 2017-11-09: 125 mg via INTRAVENOUS
  Filled 2017-11-09: qty 2

## 2017-11-09 MED ORDER — QUETIAPINE FUMARATE 25 MG PO TABS
50.0000 mg | ORAL_TABLET | Freq: Two times a day (BID) | ORAL | Status: DC
  Administered 2017-11-09 – 2017-11-14 (×11): 50 mg
  Filled 2017-11-09 (×11): qty 2

## 2017-11-09 MED ORDER — DIPHENHYDRAMINE HCL 50 MG/ML IJ SOLN
50.0000 mg | INTRAMUSCULAR | Status: AC
  Administered 2017-11-09: 50 mg via INTRAVENOUS

## 2017-11-09 MED ORDER — DIPHENHYDRAMINE HCL 50 MG/ML IJ SOLN
INTRAMUSCULAR | Status: AC
  Administered 2017-11-09: 50 mg via INTRAVENOUS
  Filled 2017-11-09: qty 1

## 2017-11-09 NOTE — Progress Notes (Signed)
[place] at [place] NAME: Michelle [name]    MR#:  [id number]  DATE OF BIRTH:  1960-10-19  SUBJECTIVE:  CHIEF COMPLAINT:   Chief Complaint  Patient presents with  . Shortness of Breath   Patient sedated on the ventilator, intermittently anxiolytics.  REVIEW OF SYSTEMS:  CONSTITUTIONAL: Unable to communicate  ROS  DRUG ALLERGIES:   Allergies  Allergen Reactions  . Carvedilol Other (See Comments)    Pt gets "sick" when on medication    VITALS:  Blood pressure 102/65, pulse (!) 110, temperature (!) 100.9 F (38.3 C), temperature source Oral, resp. rate (!) 22, height 4\' 9"  (1.448 m), weight 58 kg (127 lb 13.9 oz), last menstrual period 03/06/2005, SpO2 98 %.  PHYSICAL EXAMINATION:  GENERAL:  57 y.o.-year-old patient lying in the bed with no acute distress.  EYES: Pupils equal, round, reactive to light and accommodation. No scleral icterus. Extraocular muscles intact.  HEENT: Head atraumatic, normocephalic. Oropharynx and nasopharynx clear.  NECK:  Supple, no jugular venous distention. No thyroid enlargement, no tenderness.  LUNGS: Normal breath sounds bilaterally, no wheezing, rales,rhonchi or crepitation. No use of accessory muscles of respiration.  CARDIOVASCULAR: S1, S2 normal. No murmurs, rubs, or gallops.  ABDOMEN: Soft, nontender, nondistended. Bowel sounds present. No organomegaly or mass.  EXTREMITIES: No pedal edema, cyanosis, or clubbing.  NEUROLOGIC: Patient sedated PSYCHIATRIC: Sedated SKIN: No obvious rash, lesion, or ulcer.   Physical Exam LABORATORY PANEL:   CBC Recent Labs  Lab 11/07/17 0619  WBC 5.5  HGB 7.9*  HCT 24.4*  PLT 282   ------------------------------------------------------------------------------------------------------------------  Chemistries  Recent Labs  Lab 11/04/17 0456 11/07/17 0619  NA 137 137  K 3.3* 3.5  CL 103 100*  CO2 28 31  GLUCOSE 148* 154*  BUN 20 26*  CREATININE  <0.30* <0.30*  CALCIUM 8.2* 8.5*  MG 1.6*  --   AST 17  --   ALT 24  --   ALKPHOS 97  --   BILITOT 0.4  --    ------------------------------------------------------------------------------------------------------------------  Cardiac Enzymes No results for input(s): TROPONINI in the last 168 hours. ------------------------------------------------------------------------------------------------------------------  RADIOLOGY:  No results found.  ASSESSMENT AND PLAN:  *Acute on chronic hypoxic respiratory failure  Stable s/p trach placement Oct 17, 2017 Secondary to COPD exacerbation Difficult weaning off the vent, continue CPAP trials and weaning as tolerated  *Acute on COPD exacerbation Continue inhaled corticosteroids, BTs prn, supplemental oxygen/ventilator weaning as tolerated Slow to improve  *Chronic benign essential hypertension Controlled on current regiment  *Acute on chronic general anxiety disorder, unspecified. Continue current psychotropic regiment  Psychiatry input greatly appreciated.  *Chronic stage II sacral decubitus wound  Stable continue local wound care  *Dysphagia status post PEG tube placement  All the records are reviewed and case discussed with Care Management/Social Workerr. Management plans discussed with the patient, family and they are in agreement.  CODE STATUS: partial  TOTAL TIME TAKING CARE OF THIS PATIENT: 35 minutes.    [name] M.D on 11/09/2017   Between 7am to 6pm - Pager - [phone]  After 6pm go to [email or web address] - password [email or web address] [place] [facility]  Office  [phone]  CC: Primary care physician; Center, [facility]  Note: This dictation was prepared with Dragon dictation along with smaller phrase technology. Any transcriptional errors that result from this process are unintentional.

## 2017-11-09 NOTE — Progress Notes (Signed)
Name: Michelle [name] MRN: [id number] DOB: 10-Jan-1961     CONSULTATION DATE: 09/30/2017 Objectives and subjective: Patient continues to be on the ventilator, tolerating tube feeding, infrequent panic episodes requiring PRN benzodiazepine.patient also having episodes of plugging, and desaturations.  PAST MEDICAL HISTORY :   has a past medical history of Allergy, Anxiety, Asthma, CHF (congestive heart failure) ([place]), Cocaine abuse ([place]), COPD (chronic obstructive pulmonary disease) ([place]), Coronary artery disease, Emphysema/COPD ([place]), Hepatitis C, High cholesterol, Hypertension, Pneumothorax, and Tobacco abuse.  has a past surgical history that includes Cardiac catheterization; Chest tube insertion; Tracheostomy tube placement (N/A, 10/17/2017); and IR GASTROSTOMY TUBE MOD SED (10/31/2017). Prior to Admission medications   Medication Sig Start Date End Date Taking? Authorizing Provider  albuterol (PROVENTIL HFA;VENTOLIN HFA) 108 (90 Base) MCG/ACT inhaler Inhale 2 puffs into the lungs every 6 (six) hours as needed for wheezing or shortness of breath. 02/26/17  Yes Dustin Flock, MD  ALPRAZolam Duanne Moron) 0.5 MG tablet Take 1 tablet (0.5 mg total) by mouth 3 (three) times daily as needed for anxiety. 08/24/17  Yes Bettey Costa, MD  aspirin EC 81 MG tablet Take 81 mg by mouth daily.   Yes [provider]  ipratropium-albuterol (DUONEB) 0.5-2.5 (3) MG/3ML SOLN Take 3 mLs by nebulization every 6 (six) hours as needed. 01/04/17  Yes [name], MD  mirtazapine (REMERON) 30 MG tablet Take 30 mg by mouth at bedtime.   Yes [provider]  predniSONE (DELTASONE) 10 MG tablet Take 1 tablet (10 mg total) by mouth daily with breakfast. 08/24/17  Yes Mody, Sital, MD  SYMBICORT 160-4.5 MCG/ACT inhaler Take 2 puffs by mouth 2 (two) times daily.   Yes [provider]  tiotropium (SPIRIVA) 18 MCG inhalation capsule Place 1 capsule (18 mcg total) into inhaler and inhale daily. 01/04/17   Yes [name], MD  carvedilol (COREG) 3.125 MG tablet Take 1 tablet (3.125 mg total) by mouth every morning. Patient not taking: Reported on 09/30/2017 02/10/16   Gladstone Lighter, MD  enalapril (VASOTEC) 10 MG tablet Take 0.5 tablets (5 mg total) by mouth daily. Patient not taking: Reported on 09/30/2017 02/10/16   Gladstone Lighter, MD  feeding supplement, ENSURE ENLIVE, (ENSURE ENLIVE) LIQD Take 237 mLs by mouth 2 (two) times daily between meals. 08/24/17   Bettey Costa, MD  guaiFENesin-codeine 100-10 MG/5ML syrup Take 10 mLs by mouth every 4 (four) hours as needed for cough. Patient not taking: Reported on 08/20/2017 07/02/17   Demetrios Loll, MD  levofloxacin (LEVAQUIN) 750 MG tablet Take 1 tablet (750 mg total) by mouth daily. Patient not taking: Reported on 09/30/2017 08/24/17   Bettey Costa, MD  polyethylene glycol (GOLYTELY) 236 g solution Drink one 8 oz glass every 20 mins until stools are clear starting at 5:00pm on 08/14/17 Patient not taking: Reported on 09/30/2017 07/26/17   Lucilla Lame, MD   Allergies  Allergen Reactions  . Carvedilol Other (See Comments)    Pt gets "sick" when on medication    FAMILY HISTORY:  family history includes Asthma in her mother; Breast cancer (age of onset: 89) in her maternal aunt; CAD in her father; COPD in her sister; CVA in her father; Lung cancer in her mother; Stroke in her father. SOCIAL HISTORY:  reports that she has been smoking cigarettes.  She has a 4.10 pack-year smoking history. She has never used smokeless tobacco. She reports that she has current or past drug history. Drug: "Crack" cocaine. She reports that she does not drink alcohol.  REVIEW OF SYSTEMS:   Unable to obtain due to critical illness   VITAL SIGNS: Temp:  [98.6 F (37 C)-99.9 F (37.7 C)] 99.5 F (37.5 C) (06/08 0600) Pulse Rate:  [101-120] 120 (06/07 1700) Resp:  [21-30] 28 (06/07 1700) BP: (118-142)/(74-95) 139/74 (06/07 1700) SpO2:  [87 %-99 %] 95 % (06/07  1710) FiO2 (%):  [30 %-50 %] 40 % (06/08 0418) Weight:  [58 kg (127 lb 13.9 oz)] 58 kg (127 lb 13.9 oz) (06/08 0500)  Physical Examination:  Sedated s/p versed. Trach in place, on vent, bilateral air entry is equal with no adventitious sound. S1 & S2 are audible with no murmur Benign abdomen was normal peristalsis Sacral decub stage II. Anasarca 1+  ASSESSMENT / PLAN: Acute on chronic respiratory failure status post placement of tracheostomy on 5/16/2019with ventilator dependency.   COPD with frequent exacerbations and reactive airway disease,Bronchial dilators + inhaled steroids  Anemia.  no evidence of active bleeding  Anxiety and panic are improved, present regimen includes scheduled Klonopin at 1 mg by mouth every 12, Duragesic patch,Paxil 20 every 12, Seroquel 50 every 12  HTN.Echo in 2017 LVEF 55 to 60% Optimize antihypertensives and monitor hemodynamics.  History of seizures. OnKeppra  Sacral decub stage II, Wound care  Deconditioning -Supportive care  Dysphagias/p Placement of PEG on 10/31/2017  Follow with case management for placement  Full code  DVT &GI prophylaxis. Continue with supportive care  [name], DO  Patient ID: SHIRAH ROSEMAN, female   DOB: 12-21-1960, 57 y.o.   MRN: [id number] Patient ID: TYLIYAH MCMEEKIN, female   DOB: 1961-01-13, 57 y.o.   MRN: [id number] Patient ID: SAVILLA TURBYFILL, female   DOB: Dec 29, 1960, 57 y.o.   MRN: [id number] Patient ID: TYNEISHA HEGEMAN, female   DOB: Sep 29, 1960, 57 y.o.   MRN: [id number]

## 2017-11-09 NOTE — Progress Notes (Signed)
PT Cancellation Note  Patient Details Name: Michelle [name] MRN: [id number] DOB: 05-29-1961   Cancelled Treatment:    Reason Eval/Treat Not Completed: Medical issues which prohibited therapy.  Pt with facial swelling this morning which has improved per RN.  BP low this morning.  This PT visited pt and pt unable to perform any volitional muscle activation aside from minimal wiggling of R fingers and L toes.  Pt unable to participate in PT at this time.  This PT will sign off.  Should the pt become more appropriate for PT a new order will be needed.   [name] PT, DPT 11/09/2017, 10:35 AM

## 2017-11-09 NOTE — Progress Notes (Addendum)
Pt with facial swelling and severe in lips and right eye. Md notified and assessed at bedside. Pts o2 sats WNL at this time and remains on ventilator. 50 mg IV Benadryl and 125 mg Solumedrol ordered and given. Will continue to monitor.

## 2017-11-10 LAB — GLUCOSE, CAPILLARY
GLUCOSE-CAPILLARY: 145 mg/dL — AB (ref 65–99)
GLUCOSE-CAPILLARY: 125 mg/dL — AB (ref 65–99)
GLUCOSE-CAPILLARY: 102 mg/dL — AB (ref 65–99)
Glucose-Capillary: 122 mg/dL — ABNORMAL HIGH (ref 65–99)
Glucose-Capillary: 125 mg/dL — ABNORMAL HIGH (ref 65–99)

## 2017-11-10 LAB — BASIC METABOLIC PANEL
ANION GAP: 6 (ref 5–15)
BUN: 30 mg/dL — ABNORMAL HIGH (ref 6–20)
CHLORIDE: 98 mmol/L — AB (ref 101–111)
CO2: 32 mmol/L (ref 22–32)
Calcium: 8.8 mg/dL — ABNORMAL LOW (ref 8.9–10.3)
Creatinine, Ser: <0.3 mg/dL — ABNORMAL LOW (ref 0.44–1.00)
Glucose, Bld: 160 mg/dL — ABNORMAL HIGH (ref 65–99)
POTASSIUM: 4.1 mmol/L (ref 3.5–5.1)
SODIUM: 136 mmol/L (ref 135–145)

## 2017-11-10 LAB — CBC WITH DIFFERENTIAL/PLATELET
BASOS ABS: 0 10*3/uL (ref 0–0.1)
BASOS PCT: 0 %
Eosinophils Absolute: 0 10*3/uL (ref 0–0.7)
Eosinophils Relative: 0 %
HEMATOCRIT: 21.5 % — AB (ref 35.0–47.0)
HEMOGLOBIN: 7.1 g/dL — AB (ref 12.0–16.0)
Lymphocytes Relative: 9 %
Lymphs Abs: 0.7 10*3/uL — ABNORMAL LOW (ref 1.0–3.6)
MCH: 29.2 pg (ref 26.0–34.0)
MCHC: 33.2 g/dL (ref 32.0–36.0)
MCV: 87.9 fL (ref 80.0–100.0)
MONOS PCT: 10 %
Monocytes Absolute: 0.7 10*3/uL (ref 0.2–0.9)
NEUTROS ABS: 6.3 10*3/uL (ref 1.4–6.5)
NEUTROS PCT: 81 %
Platelets: 346 10*3/uL (ref 150–440)
RBC: 2.44 MIL/uL — AB (ref 3.80–5.20)
RDW: 16.2 % — ABNORMAL HIGH (ref 11.5–14.5)
WBC: 7.8 10*3/uL (ref 3.6–11.0)

## 2017-11-10 NOTE — Progress Notes (Signed)
[place] at [place] NAME: Terez Montee    MR#:  [id number]  DATE OF BIRTH:  06-Nov-1960  SUBJECTIVE:  CHIEF COMPLAINT:   Chief Complaint  Patient presents with  . Shortness of Breath   Patient sedated on the ventilator,s/p trach, intermittently anxiolytics. More alert today.  REVIEW OF SYSTEMS:  CONSTITUTIONAL: Unable to communicate  ROS  DRUG ALLERGIES:   Allergies  Allergen Reactions  . Carvedilol Other (See Comments)    Pt gets "sick" when on medication    VITALS:  Blood pressure 100/61, pulse 89, temperature 99 F (37.2 C), temperature source Oral, resp. rate 17, height 4\' 9"  (1.448 m), weight 61.3 kg (135 lb 2.3 oz), last menstrual period 03/06/2005, SpO2 98 %.  PHYSICAL EXAMINATION:  GENERAL:  57 y.o.-year-old patient lying in the bed with no acute distress.  EYES: Pupils equal, round, reactive to light and accommodation. No scleral icterus. Extraocular muscles intact.  HEENT: Head atraumatic, normocephalic. Oropharynx and nasopharynx clear. Trach in place. NECK:  Supple, no jugular venous distention. No thyroid enlargement, no tenderness.  LUNGS: Normal breath sounds bilaterally, no wheezing, rales,rhonchi or crepitation. No use of accessory muscles of respiration.  CARDIOVASCULAR: S1, S2 normal. No murmurs, rubs, or gallops.  ABDOMEN: Soft, nontender, nondistended. Bowel sounds present. No organomegaly or mass.  EXTREMITIES: No pedal edema, cyanosis, or clubbing.  NEUROLOGIC: Patient alert and follows simple commands today. PSYCHIATRIC: trach and on vent. SKIN: No obvious rash, lesion, or ulcer.   Physical Exam LABORATORY PANEL:   CBC Recent Labs  Lab 11/10/17 0526  WBC 7.8  HGB 7.1*  HCT 21.5*  PLT 346   ------------------------------------------------------------------------------------------------------------------  Chemistries  Recent Labs  Lab 11/04/17 0456  11/10/17 0526  NA 137   < > 136  K 3.3*    < > 4.1  CL 103   < > 98*  CO2 28   < > 32  GLUCOSE 148*   < > 160*  BUN 20   < > 30*  CREATININE <0.30*   < > <0.30*  CALCIUM 8.2*   < > 8.8*  MG 1.6*  --   --   AST 17  --   --   ALT 24  --   --   ALKPHOS 97  --   --   BILITOT 0.4  --   --    < > = values in this interval not displayed.   ------------------------------------------------------------------------------------------------------------------  Cardiac Enzymes No results for input(s): TROPONINI in the last 168 hours. ------------------------------------------------------------------------------------------------------------------  RADIOLOGY:  No results found.  ASSESSMENT AND PLAN:  *Acute on chronic hypoxic respiratory failure  Stable s/p trach placement Oct 17, 2017 Secondary to COPD exacerbation Difficult weaning off the vent, continue trials and weaning as tolerated  *Acute on COPD exacerbation Continue inhaled corticosteroids, nebs prn, supplemental oxygen/ventilator weaning as tolerated Slow to improve  *Chronic benign essential hypertension Controlled on current regiment  *Acute on chronic general anxiety disorder, unspecified. Continue current psychotropic regiment  Psychiatry input greatly appreciated.  *Chronic stage II sacral decubitus wound  Stable continue local wound care  *Dysphagia status post PEG tube placement  All the records are reviewed and case discussed with Care Management/Social Workerr. Management plans discussed with the patient, family and they are in agreement.  CODE STATUS: partial  TOTAL TIME TAKING CARE OF THIS PATIENT: 35 minutes.    [name] M.D on 11/10/2017   Between 7am to 6pm - Pager - [phone]  After 6pm go to [email or web address] - password [email or web address] [facility]  Office  [phone]  CC: Primary care physician; Center, [facility]  Note: This dictation was prepared with Dragon dictation along with smaller phrase  technology. Any transcriptional errors that result from this process are unintentional.

## 2017-11-10 NOTE — Progress Notes (Signed)
Name: Michelle [name] MRN: [id number] DOB: 07/07/1960     CONSULTATION DATE: 09/30/2017 Objectives and subjective: Patient continues to be on the ventilator, tolerating tube feeding, infrequent panic episodes requiring PRN benzodiazepine.patient also having episodes of plugging, and desaturations.  PAST MEDICAL HISTORY :   has a past medical history of Allergy, Anxiety, Asthma, CHF (congestive heart failure) ([place]), Cocaine abuse ([place]), COPD (chronic obstructive pulmonary disease) ([place]), Coronary artery disease, Emphysema/COPD ([place]), Hepatitis C, High cholesterol, Hypertension, Pneumothorax, and Tobacco abuse.  has a past surgical history that includes Cardiac catheterization; Chest tube insertion; Tracheostomy tube placement (N/A, 10/17/2017); and IR GASTROSTOMY TUBE MOD SED (10/31/2017). Prior to Admission medications   Medication Sig Start Date End Date Taking? Authorizing Provider  albuterol (PROVENTIL HFA;VENTOLIN HFA) 108 (90 Base) MCG/ACT inhaler Inhale 2 puffs into the lungs every 6 (six) hours as needed for wheezing or shortness of breath. 02/26/17  Yes Dustin Flock, MD  ALPRAZolam Duanne Moron) 0.5 MG tablet Take 1 tablet (0.5 mg total) by mouth 3 (three) times daily as needed for anxiety. 08/24/17  Yes Bettey Costa, MD  aspirin EC 81 MG tablet Take 81 mg by mouth daily.   Yes [provider]  ipratropium-albuterol (DUONEB) 0.5-2.5 (3) MG/3ML SOLN Take 3 mLs by nebulization every 6 (six) hours as needed. 01/04/17  Yes [name], MD  mirtazapine (REMERON) 30 MG tablet Take 30 mg by mouth at bedtime.   Yes [provider]  predniSONE (DELTASONE) 10 MG tablet Take 1 tablet (10 mg total) by mouth daily with breakfast. 08/24/17  Yes Mody, Sital, MD  SYMBICORT 160-4.5 MCG/ACT inhaler Take 2 puffs by mouth 2 (two) times daily.   Yes [provider]  tiotropium (SPIRIVA) 18 MCG inhalation capsule Place 1 capsule (18 mcg total) into inhaler and inhale daily. 01/04/17   Yes [name], MD  carvedilol (COREG) 3.125 MG tablet Take 1 tablet (3.125 mg total) by mouth every morning. Patient not taking: Reported on 09/30/2017 02/10/16   Gladstone Lighter, MD  enalapril (VASOTEC) 10 MG tablet Take 0.5 tablets (5 mg total) by mouth daily. Patient not taking: Reported on 09/30/2017 02/10/16   Gladstone Lighter, MD  feeding supplement, ENSURE ENLIVE, (ENSURE ENLIVE) LIQD Take 237 mLs by mouth 2 (two) times daily between meals. 08/24/17   Bettey Costa, MD  guaiFENesin-codeine 100-10 MG/5ML syrup Take 10 mLs by mouth every 4 (four) hours as needed for cough. Patient not taking: Reported on 08/20/2017 07/02/17   Demetrios Loll, MD  levofloxacin (LEVAQUIN) 750 MG tablet Take 1 tablet (750 mg total) by mouth daily. Patient not taking: Reported on 09/30/2017 08/24/17   Bettey Costa, MD  polyethylene glycol (GOLYTELY) 236 g solution Drink one 8 oz glass every 20 mins until stools are clear starting at 5:00pm on 08/14/17 Patient not taking: Reported on 09/30/2017 07/26/17   Lucilla Lame, MD   Allergies  Allergen Reactions  . Carvedilol Other (See Comments)    Pt gets "sick" when on medication    FAMILY HISTORY:  family history includes Asthma in her mother; Breast cancer (age of onset: 76) in her maternal aunt; CAD in her father; COPD in her sister; CVA in her father; Lung cancer in her mother; Stroke in her father. SOCIAL HISTORY:  reports that she has been smoking cigarettes.  She has a 4.10 pack-year smoking history. She has never used smokeless tobacco. She reports that she has current or past drug history. Drug: "Crack" cocaine. She reports that she does not drink alcohol.  REVIEW OF SYSTEMS:   Unable to obtain due to critical illness   VITAL SIGNS: Temp:  [98.2 F (36.8 C)-100.9 F (38.3 C)] 98.2 F (36.8 C) (06/09 0345) Pulse Rate:  [70-110] 70 (06/09 0500) Resp:  [15-29] 16 (06/09 0500) BP: (91-132)/(55-76) 104/68 (06/09 0500) SpO2:  [92 %-100 %] 100 % (06/09  0500) FiO2 (%):  [40 %] 40 % (06/09 0413) Weight:  [135 lb 2.3 oz (61.3 kg)] 135 lb 2.3 oz (61.3 kg) (06/09 0447)  Physical Examination:  Sedated s/p versed. Trach in place, on vent, bilateral air entry is equal with no adventitious sound. S1 & S2 are audible with no murmur Benign abdomen was normal peristalsis Sacral decub stage II. Anasarca 1+  ASSESSMENT / PLAN: Acute on chronic respiratory failure status post placement of tracheostomy on 5/16/2019with ventilator dependency. Will try to begin weaning since patient is much more comfortable.   COPD with frequent exacerbations and reactive airway disease,Bronchial dilators + inhaled steroids  Anemia.  no evidence of active bleeding  Anxiety and panic are improved, present regimen includes scheduled Klonopin at 1 mg by mouth every 12, Duragesic patch,Paxil 20 every 12, Seroquel 50 every 12. Patient has had significant improvement in mentation  HTN.Echo in 2017 LVEF 55 to 60% Optimize antihypertensives and monitor hemodynamics.  History of seizures. OnKeppra  Sacral decub stage II, Wound care  Deconditioning -Supportive care  Dysphagias/p Placement of PEG on 10/31/2017  Follow with case management for placement  Full code  DVT &GI prophylaxis. Continue with supportive care  [name], DO  Patient ID: Michelle [name], female   DOB: Jan 26, 1961, 57 y.o.   MRN: [id number] Patient ID: Michelle [name], female   DOB: 01-Oct-1960, 57 y.o.   MRN: [id number] Patient ID: Michelle [name], female   DOB: June 02, 1961, 57 y.o.   MRN: [id number] Patient ID: Michelle [name], female   DOB: [birth year], 57 y.o.   MRN: [id number] Patient ID: Michelle [name], female   DOB: 1961/05/25, 57 y.o.   MRN: [id number]

## 2017-11-10 NOTE — Progress Notes (Addendum)
Pt on PSV10/5 for 6hrs today. Back to [facility] at 1500 for increased work of breathing. She has been more alert. Mouthing words. Oxy 2.5mg  x2 today for generalized c/o pain. UOP adequate. Generalized edema greatest in UE's. Large loose BM today.Tolerates TF. Several family member in to see. Hgb 7.1 Dr Jefferson Fuel aware. No evidence of bleeding.

## 2017-11-11 LAB — CBC
HCT: 23.9 % — ABNORMAL LOW (ref 35.0–47.0)
Hemoglobin: 7.7 g/dL — ABNORMAL LOW (ref 12.0–16.0)
MCH: 28.5 pg (ref 26.0–34.0)
MCHC: 32.3 g/dL (ref 32.0–36.0)
MCV: 88.3 fL (ref 80.0–100.0)
PLATELETS: 445 10*3/uL — AB (ref 150–440)
RBC: 2.7 MIL/uL — AB (ref 3.80–5.20)
RDW: 16.3 % — AB (ref 11.5–14.5)
WBC: 8.1 10*3/uL (ref 3.6–11.0)

## 2017-11-11 LAB — GLUCOSE, CAPILLARY
GLUCOSE-CAPILLARY: 110 mg/dL — AB (ref 65–99)
Glucose-Capillary: 112 mg/dL — ABNORMAL HIGH (ref 65–99)
Glucose-Capillary: 117 mg/dL — ABNORMAL HIGH (ref 65–99)
Glucose-Capillary: 126 mg/dL — ABNORMAL HIGH (ref 65–99)
Glucose-Capillary: 132 mg/dL — ABNORMAL HIGH (ref 65–99)
Glucose-Capillary: 96 mg/dL (ref 65–99)

## 2017-11-11 MED ORDER — ALPRAZOLAM 0.5 MG PO TABS
0.5000 mg | ORAL_TABLET | Freq: Three times a day (TID) | ORAL | Status: DC | PRN
  Administered 2017-11-12 – 2017-12-12 (×10): 0.5 mg
  Filled 2017-11-11 (×11): qty 1

## 2017-11-11 MED ORDER — CLONAZEPAM 1 MG PO TABS
1.0000 mg | ORAL_TABLET | Freq: Two times a day (BID) | ORAL | Status: DC
  Administered 2017-11-11 – 2017-11-19 (×15): 1 mg
  Filled 2017-11-11 (×15): qty 1

## 2017-11-11 MED ORDER — VALPROIC ACID 250 MG/5ML PO SOLN
500.0000 mg | Freq: Two times a day (BID) | ORAL | Status: DC
  Administered 2017-11-11 – 2017-12-14 (×67): 500 mg
  Filled 2017-11-11 (×70): qty 10

## 2017-11-11 NOTE — Progress Notes (Signed)
Remains ventilator dependent with minimal progress weaning Continues to have intermittent agitation  Vitals:   11/11/17 1532 11/11/17 1600 11/11/17 1700 11/11/17 1800  BP:  108/60 108/66 (!) 105/59  Pulse:  89 94 90  Resp:  16 16 17   Temp:  98.9 F (37.2 C)    TempSrc:  Axillary    SpO2: 99% 99% 99% 99%  Weight:      Height:       On ventilator, sedated HEENT WNL No JVD noted Trach site clean Prolonged expiratory phase, no wheezes Regular, no M NABS, soft Extremities warm, no edema No focal neurologic deficits  BMP Latest Ref Rng & Units 11/10/2017 11/07/2017 11/04/2017  Glucose 65 - 99 mg/dL 160(H) 154(H) 148(H)  BUN 6 - 20 mg/dL 30(H) 26(H) 20  Creatinine 0.44 - 1.00 mg/dL <0.30(L) <0.30(L) <0.30(L)  Sodium 135 - 145 mmol/L 136 137 137  Potassium 3.5 - 5.1 mmol/L 4.1 3.5 3.3(L)  Chloride 101 - 111 mmol/L 98(L) 100(L) 103  CO2 22 - 32 mmol/L 32 31 28  Calcium 8.9 - 10.3 mg/dL 8.8(L) 8.5(L) 8.2(L)    CBC Latest Ref Rng & Units 11/11/2017 11/10/2017 11/07/2017  WBC 3.6 - 11.0 K/uL 8.1 7.8 5.5  Hemoglobin 12.0 - 16.0 g/dL 7.7(L) 7.1(L) 7.9(L)  Hematocrit 35.0 - 47.0 % 23.9(L) 21.5(L) 24.4(L)  Platelets 150 - 440 K/uL 445(H) 346 282    CXR: NNF   IMP: Very severe COPD Prolonged mechanical ventilation Failure to wean Tracheostomy tube status ICU acquired anemia G-tube status Anxiety/panic disorder   PLAN/REC: Cont vent support - settings reviewed and/or adjusted Cont vent bundle Wean in PSV mode as tolerated Continue nebulized steroids and bronchodilators DVT px: SQ heparin Monitor CBC intermittently Transfuse per usual guidelines  Cont Duragesic, scheduled clonazepam, scheduled quetiapine, paroxetine Alprazolam as needed Discontinue glycopyrrolate, scopolamine (which might be contributing to agitated delirium)  Merton Border, MD PCCM service Mobile [phone] Pager [phone] 11/11/2017 6:59 PM

## 2017-11-11 NOTE — Progress Notes (Signed)
[place] at [place] NAME: Michelle [name]    MR#:  [id number]  DATE OF BIRTH:  [birth year]  SUBJECTIVE:  CHIEF COMPLAINT:   Chief Complaint  Patient presents with  . Shortness of Breath   Patient sedated on the ventilator,s/p trach, intermittently anxiolytics. More alert today.  Failed multiple trial of weaning off the vent support.  REVIEW OF SYSTEMS:  CONSTITUTIONAL: Unable to communicate  ROS  DRUG ALLERGIES:   Allergies  Allergen Reactions  . Carvedilol Other (See Comments)    Pt gets "sick" when on medication    VITALS:  Blood pressure 114/66, pulse 94, temperature 98.7 F (37.1 C), temperature source Axillary, resp. rate (!) 22, height 4\' 9"  (1.448 m), weight 62.2 kg (137 lb 2 oz), last menstrual period 03/06/2005, SpO2 99 %.  PHYSICAL EXAMINATION:  GENERAL:  57 y.o.-year-old patient lying in the bed with no acute distress.  EYES: Pupils equal, round, reactive to light and accommodation. No scleral icterus. Extraocular muscles intact.  HEENT: Head atraumatic, normocephalic. Oropharynx and nasopharynx clear. Trach in place. NECK:  Supple, no jugular venous distention. No thyroid enlargement, no tenderness.  LUNGS: Normal breath sounds bilaterally, no wheezing, rales,rhonchi or crepitation. No use of accessory muscles of respiration.  CARDIOVASCULAR: S1, S2 normal. No murmurs, rubs, or gallops.  ABDOMEN: Soft, nontender, nondistended. Bowel sounds present. No organomegaly or mass.  EXTREMITIES: No pedal edema, cyanosis, or clubbing.  NEUROLOGIC: Patient alert and follows simple commands today. PSYCHIATRIC: trach and on vent. SKIN: No obvious rash, lesion, or ulcer.   Physical Exam LABORATORY PANEL:   CBC Recent Labs  Lab 11/11/17 0542  WBC 8.1  HGB 7.7*  HCT 23.9*  PLT 445*   ------------------------------------------------------------------------------------------------------------------  Chemistries  Recent Labs   Lab 11/10/17 0526  NA 136  K 4.1  CL 98*  CO2 32  GLUCOSE 160*  BUN 30*  CREATININE <0.30*  CALCIUM 8.8*   ------------------------------------------------------------------------------------------------------------------  Cardiac Enzymes No results for input(s): TROPONINI in the last 168 hours. ------------------------------------------------------------------------------------------------------------------  RADIOLOGY:  No results found.  ASSESSMENT AND PLAN:  *Acute on chronic hypoxic respiratory failure  Stable s/p trach placement Oct 17, 2017 Secondary to COPD exacerbation Difficult weaning off the vent, continue trials and weaning as toleratedper ICU.  *Acute on COPD exacerbation Continue inhaled corticosteroids, nebs prn, supplemental oxygen/ventilator weaning as tolerated Slow to improve  *Chronic benign essential hypertension Controlled on current regiment  *Acute on chronic general anxiety disorder, unspecified. Continue current psychotropic regiment  Psychiatry input greatly appreciated.  *Chronic stage II sacral decubitus wound  Stable continue local wound care  *Dysphagia status post PEG tube placement  All the records are reviewed and case discussed with Care Management/Social Workerr. Management plans discussed with the patient, family and they are in agreement.  CODE STATUS: partial  TOTAL TIME TAKING CARE OF THIS PATIENT: 35 minutes.    [name] M.D on 11/11/2017   Between 7am to 6pm - Pager - [phone]  After 6pm go to [email or web address] - password [email or web address] [facility]  Office  [phone]  CC: Primary care physician; Center, [facility]  Note: This dictation was prepared with Dragon dictation along with smaller phrase technology. Any transcriptional errors that result from this process are unintentional.

## 2017-11-12 LAB — GLUCOSE, CAPILLARY
GLUCOSE-CAPILLARY: 104 mg/dL — AB (ref 65–99)
GLUCOSE-CAPILLARY: 113 mg/dL — AB (ref 65–99)
Glucose-Capillary: 119 mg/dL — ABNORMAL HIGH (ref 65–99)
Glucose-Capillary: 117 mg/dL — ABNORMAL HIGH (ref 65–99)
Glucose-Capillary: 104 mg/dL — ABNORMAL HIGH (ref 65–99)
Glucose-Capillary: 105 mg/dL — ABNORMAL HIGH (ref 65–99)

## 2017-11-12 NOTE — Progress Notes (Signed)
Much calmer.  Tolerating PSV mode.  No distress.  No new complaints  Vitals:   11/12/17 0900 11/12/17 1000 11/12/17 1100 11/12/17 1200  BP: 112/64 112/68 (!) 104/59 (!) 100/53  Pulse: 91 86 89 87  Resp: 10 (!) 8 (!) 9 (!) 8  Temp:      TempSrc:      SpO2: 96% 97% 98% 97%  Weight:      Height:       PSV mode, no overt distress HEENT WNL Tracheostomy site clean Prolonged expiratory phase, no wheezes anteriorly Regular, no M NABS, soft, NT Extremities warm, no edema CNs intact, moves all extremities  BMP Latest Ref Rng & Units 11/10/2017 11/07/2017 11/04/2017  Glucose 65 - 99 mg/dL 160(H) 154(H) 148(H)  BUN 6 - 20 mg/dL 30(H) 26(H) 20  Creatinine 0.44 - 1.00 mg/dL <0.30(L) <0.30(L) <0.30(L)  Sodium 135 - 145 mmol/L 136 137 137  Potassium 3.5 - 5.1 mmol/L 4.1 3.5 3.3(L)  Chloride 101 - 111 mmol/L 98(L) 100(L) 103  CO2 22 - 32 mmol/L 32 31 28  Calcium 8.9 - 10.3 mg/dL 8.8(L) 8.5(L) 8.2(L)    CBC Latest Ref Rng & Units 11/11/2017 11/10/2017 11/07/2017  WBC 3.6 - 11.0 K/uL 8.1 7.8 5.5  Hemoglobin 12.0 - 16.0 g/dL 7.7(L) 7.1(L) 7.9(L)  Hematocrit 35.0 - 47.0 % 23.9(L) 21.5(L) 24.4(L)  Platelets 150 - 440 K/uL 445(H) 346 282    CXR: No new film   IMP: Very severe COPD Prolonged mechanical ventilation Failure to wean Tracheostomy tube status ICU acquired anemia G-tube status Anxiety/panic disorder Major depression   PLAN/REC: Cont vent support - settings reviewed and/or adjusted Cont vent bundle Wean in PSV mode as tolerated Continue nebulized steroids and bronchodilators Continue DVT px: SQ heparin Monitor CBC intermittently Transfuse per usual guidelines  Cont Duragesic, scheduled clonazepam, scheduled quetiapine, paroxetine Continue alprazolam as needed Working on transfer to [employer], MD PCCM service Mobile [phone] Pager [phone] 11/12/2017 4:13 PM

## 2017-11-12 NOTE — Progress Notes (Signed)
[place] at [place] NAME: Michelle [name]    MR#:  [id number]  DATE OF BIRTH:  February 06, 1961  SUBJECTIVE:  CHIEF COMPLAINT:   Chief Complaint  Patient presents with  . Shortness of Breath   Patient sedated on the ventilator,s/p trach, intermittently anxiolytics. More alert today.  Failed multiple trial of weaning off the vent support. Much calm now.  REVIEW OF SYSTEMS:  CONSTITUTIONAL: Unable to communicate  ROS  DRUG ALLERGIES:   Allergies  Allergen Reactions  . Carvedilol Other (See Comments)    Pt gets "sick" when on medication    VITALS:  Blood pressure 103/63, pulse 84, temperature 98.9 F (37.2 C), temperature source Oral, resp. rate 14, height 4\' 9"  (1.448 m), weight 62.3 kg (137 lb 5.6 oz), last menstrual period 03/06/2005, SpO2 98 %.  PHYSICAL EXAMINATION:  GENERAL:  57 y.o.-year-old patient lying in the bed with no acute distress.  EYES: Pupils equal, round, reactive to light and accommodation. No scleral icterus. Extraocular muscles intact.  HEENT: Head atraumatic, normocephalic. Oropharynx and nasopharynx clear. Trach in place. NECK:  Supple, no jugular venous distention. No thyroid enlargement, no tenderness.  LUNGS: Normal breath sounds bilaterally, no wheezing, rales,rhonchi or crepitation. No use of accessory muscles of respiration.  CARDIOVASCULAR: S1, S2 normal. No murmurs, rubs, or gallops.  ABDOMEN: Soft, nontender, nondistended. Bowel sounds present. No organomegaly or mass.  EXTREMITIES: No pedal edema, cyanosis, or clubbing.  NEUROLOGIC: Patient alert and follows simple commands today. PSYCHIATRIC: trach and on vent. SKIN: No obvious rash, lesion, or ulcer.   Physical Exam LABORATORY PANEL:   CBC Recent Labs  Lab 11/11/17 0542  WBC 8.1  HGB 7.7*  HCT 23.9*  PLT 445*   ------------------------------------------------------------------------------------------------------------------  Chemistries   Recent Labs  Lab 11/10/17 0526  NA 136  K 4.1  CL 98*  CO2 32  GLUCOSE 160*  BUN 30*  CREATININE <0.30*  CALCIUM 8.8*   ------------------------------------------------------------------------------------------------------------------  Cardiac Enzymes No results for input(s): TROPONINI in the last 168 hours. ------------------------------------------------------------------------------------------------------------------  RADIOLOGY:  No results found.  ASSESSMENT AND PLAN:  *Acute on chronic hypoxic respiratory failure  Stable s/p trach placement Oct 17, 2017 Secondary to COPD exacerbation Difficult weaning off the vent, continue trials and weaning as toleratedper ICU.  *Acute on COPD exacerbation Continue inhaled corticosteroids, nebs prn, supplemental oxygen/ventilator weaning as tolerated Slow to improve  *Chronic benign essential hypertension Controlled on current regiment  *Acute on chronic general anxiety disorder, unspecified. Continue current psychotropic regiment  Psychiatry input greatly appreciated.  *Chronic stage II sacral decubitus wound  Stable continue local wound care  *Dysphagia status post PEG tube placement  All the records are reviewed and case discussed with Care Management/Social Workerr. Management plans discussed with the patient, family and they are in agreement.  CODE STATUS: partial  TOTAL TIME TAKING CARE OF THIS PATIENT: 35 minutes.    [name] M.D on 11/12/2017   Between 7am to 6pm - Pager - [phone]  After 6pm go to [email or web address] - password [email or web address] [facility]  Office  [phone]  CC: Primary care physician; Center, [facility]  Note: This dictation was prepared with Dragon dictation along with smaller phrase technology. Any transcriptional errors that result from this process are unintentional.

## 2017-11-12 NOTE — Clinical Social Work Note (Signed)
Michelle [name] at [name] is reviewing updated information sent to her yesterday and will let me know if there are any issues. Patient has diagnosis of major depression for seroquel use. CSW has contacted patient's daughter, Michelle [name], and informed her that she needed to begin paperwork for the facility and that her mother would be ready for transfer this week according to MD. CSW will now need to see what transport company can be utilized as all of the companies we have utilized in the past are saying they cannot transport due to patient having medicaid. Shela Leff MSW,LCSW [phone]

## 2017-11-13 DIAGNOSIS — Z930 Tracheostomy status: Secondary | ICD-10-CM

## 2017-11-13 LAB — GLUCOSE, CAPILLARY
GLUCOSE-CAPILLARY: 106 mg/dL — AB (ref 65–99)
GLUCOSE-CAPILLARY: 115 mg/dL — AB (ref 65–99)
Glucose-Capillary: 111 mg/dL — ABNORMAL HIGH (ref 65–99)
Glucose-Capillary: 114 mg/dL — ABNORMAL HIGH (ref 65–99)
Glucose-Capillary: 116 mg/dL — ABNORMAL HIGH (ref 65–99)
Glucose-Capillary: 120 mg/dL — ABNORMAL HIGH (ref 65–99)
Glucose-Capillary: 88 mg/dL (ref 65–99)

## 2017-11-13 LAB — CBC WITH DIFFERENTIAL/PLATELET
BAND NEUTROPHILS: 2 %
BASOS PCT: 0 %
Basophils Absolute: 0 10*3/uL (ref 0–0.1)
Blasts: 0 %
EOS ABS: 0.1 10*3/uL (ref 0–0.7)
Eosinophils Relative: 1 %
HCT: 22.9 % — ABNORMAL LOW (ref 35.0–47.0)
Hemoglobin: 7.5 g/dL — ABNORMAL LOW (ref 12.0–16.0)
LYMPHS PCT: 21 %
Lymphs Abs: 1.6 10*3/uL (ref 1.0–3.6)
MCH: 28.6 pg (ref 26.0–34.0)
MCHC: 32.6 g/dL (ref 32.0–36.0)
MCV: 88 fL (ref 80.0–100.0)
MONO ABS: 0.4 10*3/uL (ref 0.2–0.9)
MONOS PCT: 5 %
Metamyelocytes Relative: 2 %
Myelocytes: 1 %
NEUTROS ABS: 5.7 10*3/uL (ref 1.4–6.5)
Neutrophils Relative %: 68 %
OTHER: 0 %
Platelets: 420 10*3/uL (ref 150–440)
Promyelocytes Relative: 0 %
RBC: 2.61 MIL/uL — ABNORMAL LOW (ref 3.80–5.20)
RDW: 17 % — AB (ref 11.5–14.5)
WBC: 7.8 10*3/uL (ref 3.6–11.0)
nRBC: 3 /100 WBC — ABNORMAL HIGH

## 2017-11-13 NOTE — Clinical Social Work Note (Signed)
Patient's daughter, [name], brought most of the needed documents up to ICU and CSW made copies and faxed to Le[place] at [facility]. Sonia Baller needed additional documents and [name] will be bringing those tomorrow. Sonia Baller is talking with [facility] to see if they will transport. Sonia Baller stated that she has to receive all documents from [name] and then their administrative team will review. Sonia Baller does not foresee any issues at this time. She will let me know when they are ready to accept patient. Transportation might be the next hurdle. Shela Leff MSW,LCSW [phone]

## 2017-11-13 NOTE — Progress Notes (Signed)
Spoke with patient's RN and it was decided patient will remain on PSV 5/5 and not trialed on ATC due to patient starting to exhibit signs of tiring and  Increased belly  breathing on current vent settings but  in no distress. So we will continue PSV and not trial on ATC at this time.

## 2017-11-13 NOTE — Progress Notes (Signed)
Nutrition Follow-up  DOCUMENTATION CODES:   Non-severe (moderate) malnutrition in context of chronic illness  INTERVENTION:  Continue Vital 1.5 at 35 mL/hr (840 mL goal daily volume) + Pro-Stat 30 mL once daily via G-tube. Provides 1360 kcal, 72 grams of protein, 638 mL H2O daily.  With free water flush of 100 mL Q8hrs patient will receive a total of 938 mL H2O daily including water in tube feeding.  Continue MVI daily per tube.  Continue Juven BID per tube, each supplement provides 80 kcal, 14 grams of amino acids, and vitamins/minerals essential for wound healing.  NUTRITION DIAGNOSIS:   Moderate Malnutrition related to chronic illness(COPD, CHF, hepatitis C, cocaine abuse) as evidenced by mild fat depletion, moderate fat depletion, mild muscle depletion, moderate muscle depletion.  Ongoing - addressing with TF regimen.  GOAL:   Provide needs based on ASPEN/SCCM guidelines  Met with TF regimen.  MONITOR:   Vent status, Labs, Weight trends, TF tolerance, I & O's  REASON FOR ASSESSMENT:   Ventilator    ASSESSMENT:   57 year old female with PMHx of emphysema, COPD, HTN, hypercholesterolemia, hx pneumothorax s/p chest tube insertion, CAD s/p cardiac catheterization, cocaine abuse, tobacco abuse, asthma, CHF, anxiety, hepatitis C admitted with acute exacerbation of COPD, acute on chronic respiratory failure requiring intubation 4/29, PNA, cocaine intoxication, AKI due to renal hypoperfusion.   -Patient is s/p tracheostomy tube placement on 5/16. OGT was replaced with NGT. -Patient s/p placement of 20 Fr. Pull-through gastrostomy tube on 5/30 by IR. -Per [place] RN documentation today the device related pressure injury at trach has now healed as no open wounds were found. -Pending transfer to vent/SNF once all documentation is complete and a mode of transportation is found.  Patient remains on mechanical ventilation through tracheostomy tube. In PSV mode today with FiO2 40%  and PEEP of 5cmH2O.  Access: 20 Fr. G-tube placed on 5/30 by IR  TF: pt tolerating Vital 1.5 at 35 mL/hr + Pro-Stat 30 mL once daily; per pump history received 792 mL tube feeds in the past 24 hrs (94% goal daily volume)  Patient is currently intubated on ventilator support Ve: 7.3 L/min Temp (24hrs), Avg:99 F (37.2 C), Min:98.7 F (37.1 C), Max:99.4 F (37.4 C)  Propofol: N/A  Medications reviewed and include: clonazepam, famotidine, fentanyl patch, free water flush 100 mL Q8hrs, Novolog 0-9 units Q4hrs, liquid MVI daily, senna-docusate, valproic acid.  Labs reviewed: CBG 104-116 past 24 hrs.  I/O: 750 mL UOP yesterday (0.5 mL/kg/hr)  Weight trend: 62.8 kg on 6/12; +17.1 kg from admission  Discussed with RN and on rounds.   Diet Order:   Diet Order    None      EDUCATION NEEDS:   No education needs have been identified at this time  Skin:  Skin Assessment: Skin Integrity Issues: Skin Integrity Issues:: Stage II Stage II: sacrum Incisions: N/A Other: device related pressure injury has healed now  Last BM:  11/11/2017 - small type 6  Height:   Ht Readings from Last 1 Encounters:  10/01/17 '4\' 9"'$  (1.448 m)    Weight:   Wt Readings from Last 1 Encounters:  11/13/17 138 lb 7.2 oz (62.8 kg)    Ideal Body Weight:  43.2 kg  BMI:  Body mass index is 29.96 kg/m.  Estimated Nutritional Needs:   Kcal:  1150-1370 (25-30 kcal/kg)  Protein:  70-80 grams (1.5-1.8 grams/kg)  Fluid:  1.4-1.6 L/day (30-35 mL/kg)  Willey Blade, MS, RD, LDN Office: [phone] Pager:  [phone] After Hours/Weekend Pager: [phone]

## 2017-11-13 NOTE — Progress Notes (Signed)
Daily Progress Note   Patient Name: Michelle [name]       Date: 11/13/2017 DOB: January 28, 1961  Age: 57 y.o. MRN#: [id number] Attending Physician: Loletha Grayer, MD Primary Care Physician: [facility] [place] Date: 09/30/2017  Reason for Consultation/Follow-up: Establishing goals of care  Subjective: Patient lying in bed. Trach/Vented. Visitor at bedside. She is more awake and alert today. Able to mouth conversation with visitor and myself. No family is at the bedside. Spoke with daughter, [name]. She reports their goal is for patient to continue receiving current care with [facility]. [name], states her sister has completed required paperwork and family feels that their mother will improve once out of the hospital. We discussed her current health condition and now baseline. Daughter states they are ok with her current status as she is here on earth and they can see each other. Daughter does not have any questions or concerns at this time.  Chart Reviewed.   Length of Stay: 44  Current Medications: Scheduled Meds:  . aspirin  81 mg Oral Daily  . budesonide (PULMICORT) nebulizer solution  0.5 mg Nebulization BID  . chlorhexidine gluconate (MEDLINE KIT)  15 mL Mouth Rinse BID  . clonazePAM  1 mg Per Tube BID  . famotidine  20 mg Per Tube Daily  . feeding supplement (PRO-STAT SUGAR FREE 64)  30 mL Oral Daily  . feeding supplement (VITAL 1.5 CAL)  1,000 mL Per Tube Q24H  . fentaNYL  75 mcg Transdermal Q72H  . free water  100 mL Per Tube Q8H  . heparin  5,000 Units Subcutaneous Q8H  . insulin aspart  0-9 Units Subcutaneous Q4H  . ipratropium-albuterol  3 mL Nebulization Q6H  . mouth rinse  15 mL Mouth Rinse 10 times per day  . multivitamin   15 mL Per Tube Daily  . nutrition supplement (JUVEN)  1 packet Per Tube BID  . nystatin cream   Topical BID  . PARoxetine  20 mg Per Tube BID  . QUEtiapine  50 mg Per Tube BID  . senna-docusate  1 tablet Per Tube BID  . sodium chloride flush  10-40 mL Intracatheter Q12H  . valproic acid  500 mg Per Tube BID    Continuous Infusions:   PRN Meds: acetaminophen, albuterol,  ALPRAZolam, bisacodyl, hydrALAZINE, metoprolol tartrate, ondansetron (ZOFRAN) IV, oxyCODONE, sodium chloride flush, sodium chloride flush  Physical Exam  Constitutional: Vital signs are normal. She is cooperative. She appears ill.  Trach/Vented FiO2 40%  Cardiovascular: Normal rate, regular rhythm and normal heart sounds.  Pulmonary/Chest: She has decreased breath sounds.  Trach/Vented FiO2 40%  Musculoskeletal:  Bilateral ankle edema   Neurological: She is alert.  Nursing note and vitals reviewed.           Vital Signs: BP 138/72   Pulse 92   Temp 98.8 F (37.1 C) (Oral)   Resp (!) 27   Ht 4' 9"  (1.448 m)   Wt 62.8 kg (138 lb 7.2 oz)   LMP 03/06/2005 (Approximate)   SpO2 96%   BMI 29.96 kg/m  SpO2: SpO2: 96 % O2 Device: O2 Device: Ventilator O2 Flow Rate:    Intake/output summary:   Intake/Output Summary (Last 24 hours) at 11/13/2017 1442 Last data filed at 11/13/2017 1438 Gross per 24 hour  Intake 822.17 ml  Output 1402 ml  Net -579.83 ml   LBM: Last BM Date: 11/11/17 Baseline Weight: Weight: 45.7 kg (100 lb 12.8 oz) Most recent weight: Weight: 62.8 kg (138 lb 7.2 oz)       Palliative Assessment/Data: Trach/Vent   Flowsheet Rows     Most Recent Value  Intake Tab  Referral Department  Hospitalist  Unit at Time of Referral  ICU  Palliative Care Primary Diagnosis  Pulmonary  Date Notified  10/24/17  Palliative Care Type  Return patient Palliative Care  Reason for referral  Clarify Goals of Care  Date of Admission  09/30/17  Date first seen by Palliative Care  10/28/17  # of days  Palliative referral response time  4 Day(s)  # of days IP prior to Palliative referral  24  Clinical Assessment  Psychosocial & Spiritual Assessment  Palliative Care Outcomes      Patient Active Problem List   Diagnosis Date Noted  . Panic disorder 10/29/2017  . Altered mental status   . Pressure injury of skin 10/23/2017  . Acute on chronic respiratory failure ([place]) 09/30/2017  . Acute respiratory failure ([place]) 08/20/2017  . Acute on chronic respiratory failure with hypoxia and hypercapnia ([place]) 06/29/2017  . Acute on chronic respiratory failure with hypoxia ([place]) 02/23/2017  . Protein-calorie malnutrition, severe 05/23/2016  . Sepsis ([place]) 05/22/2016  . Malnutrition of moderate degree ([place]) 10/25/2014  . COPD exacerbation ([place]) 10/24/2014  . Tobacco abuse 10/24/2014    Palliative Care Assessment & Plan   Patient Profile: [name] a57 y.o.femalewith a known history of anxiety, CHF, cocaine abuse, COPD, coronary artery disease, hepatitis C, hypertension- as severely short of breath at home and so called EMS. On the way EMT gave her 5-6 breathing treatments but she was still extremely short of breath in the emergency room on arrival, so ER physician intubated her.   Recommendations/Plan:  Partial Code (currently trach/vent)   Continue to treat the treatable with aggressive care. PEG in place. No life-sustaining interventions such as Chest compressions, shocks, or ACLS medications.  Per daughter, they are hopeful for transfer in the next couple of days to [facility] facility). [name], daughter, states her sister completed paperwork.   Would recommend at minimum outpatient palliative services.   Palliative Medicine team will continue to provide support to patient, family, and medical team as needed.   Goals of Care and Additional Recommendations:  Limitations on Scope of Treatment: Full Scope Treatment  Code  Status:    Code Status Orders  (From admission,  onward)        Start     Ordered   10/28/17 1707  Limited resuscitation (code)  Continuous    Question Answer Comment  In the event of cardiac or respiratory ARREST: Initiate Code Blue, Call Rapid Response No   In the event of cardiac or respiratory ARREST: Perform CPR No   In the event of cardiac or respiratory ARREST: Perform Intubation/Mechanical Ventilation Yes   In the event of cardiac or respiratory ARREST: Use NIPPV/BiPAp only if indicated No   In the event of cardiac or respiratory ARREST: Administer ACLS medications if indicated No   In the event of cardiac or respiratory ARREST: Perform Defibrillation or Cardioversion if indicated No   Comments Continue trach and ventilator. No resusitation if heart stops.      10/28/17 1711    Code Status History    Date Active Date Inactive Code Status Order ID Comments User Context   09/30/2017 1945 10/28/2017 1711 Full Code [id number]  [name], MD Inpatient   08/20/2017 1713 08/24/2017 1743 Full Code [id number]  Dustin Flock, MD Inpatient   06/29/2017 0625 07/02/2017 2008 Full Code [id number]  Harrie Foreman, MD Inpatient   02/23/2017 0241 02/26/2017 1634 Full Code [id number]  Harrie Foreman, MD Inpatient   01/01/2017 1144 01/04/2017 1354 Full Code [id number]  Bettey Costa, MD Inpatient   05/22/2016 2155 05/27/2016 1645 Full Code [id number]  Fritzi Mandes, MD Inpatient   04/23/2016 2020 04/27/2016 1848 Full Code [id number]  Hower, Aaron Mose, MD ED   02/07/2016 1922 02/10/2016 1835 Full Code [id number]  Gladstone Lighter, MD Inpatient   11/29/2015 0519 12/01/2015 1609 Full Code [id number]  Harrie Foreman, MD Inpatient   11/05/2015 0911 11/07/2015 1606 Full Code [id number]  Hillary Bow, MD ED   10/24/2014 1522 10/27/2014 1630 Full Code [id number]  Hillary Bow, MD Inpatient       Prognosis:   Unable to determine-guarded to poor in the setting of tracheostomy w/ ventilation (unable to wean off vent), PEG tube, deconditioned, immobility,  respiratory failure, COPD, anxiety, coronary artery disease, hepatitis C, hypertension, hyperlipidemia, and CHF.   Discharge Planning:  Per family plan is to d/c to [facility] facility) with trach/vent assistance. I would recommend outpatient Palliative services to follow.   Care plan was discussed with patient's family and bedside RN.   Thank you for allowing the Palliative Medicine Team to assist in the care of this patient.   Total Time 35 min Prolonged Time Billed  No        Greater than 50%  of this time was spent counseling and coordinating care related to the above assessment and plan.  Alda Lea, NP-BC Palliative Medicine Team  Phone: [phone] Fax: [phone] Amion: N.[place]  Please contact Palliative Medicine Team phone at [phone] for questions and concerns.

## 2017-11-13 NOTE — Progress Notes (Signed)
Patient was able to tolerate ATC 40% 1.5 hours.  Discontinued ATC trails due to increase respirations and increased agitation.Patient placed back on ventilator at 5pm.

## 2017-11-13 NOTE — Consult Note (Signed)
[place] Nurse wound follow up Wound type: pressure injury related to trach plate Today I am not able to appreciate any open wounds at this site.  Appears to have healed.  Continue routine trach care, add silicone foam under edge of trach plate if skin breakdown is noted to pad site from pressure and further injury.  Noted in the chart patient has Stage 2 on her sacrum. Staff have initiated the appropriate skin care order set for this wound. Using foam dressing to protect area, turning and repositioning every 2 hours and she is on a LALM while in the ICU for moisture management and pressure redistribution.  [place] Nurse team will follow along with you for weekly wound assessments.  Please notify me of any acute changes in the wounds or any new areas of concerns [place] MSN, RN,CWOCN, [place], [place]

## 2017-11-13 NOTE — Progress Notes (Signed)
Panic attack earlier today.  Calm upon my evaluation.  Has tolerated PSV mode all day.  Presently on ATC and tolerating well.  Vitals:   11/13/17 1218 11/13/17 1300 11/13/17 1400 11/13/17 1500  BP:  140/75 138/72 130/71  Pulse: 89 93 92 96  Resp: (!) 21 (!) 28 (!) 27 17  Temp: 98.8 F (37.1 C)     TempSrc: Oral     SpO2: 97% 97% 96%   Weight:      Height:       ATC, no overt distress HEENT WNL Tracheostomy site clean Prolonged expiratory phase, no wheezes anteriorly Regular, no M NABS, soft, NT Extremities warm, no edema CNs intact, moves all extremities   The documented exam noted above has been reviewed by me on this day and is accurate  BMP Latest Ref Rng & Units 11/10/2017 11/07/2017 11/04/2017  Glucose 65 - 99 mg/dL 160(H) 154(H) 148(H)  BUN 6 - 20 mg/dL 30(H) 26(H) 20  Creatinine 0.44 - 1.00 mg/dL <0.30(L) <0.30(L) <0.30(L)  Sodium 135 - 145 mmol/L 136 137 137  Potassium 3.5 - 5.1 mmol/L 4.1 3.5 3.3(L)  Chloride 101 - 111 mmol/L 98(L) 100(L) 103  CO2 22 - 32 mmol/L 32 31 28  Calcium 8.9 - 10.3 mg/dL 8.8(L) 8.5(L) 8.2(L)    CBC Latest Ref Rng & Units 11/13/2017 11/11/2017 11/10/2017  WBC 3.6 - 11.0 K/uL 7.8 8.1 7.8  Hemoglobin 12.0 - 16.0 g/dL 7.5(L) 7.7(L) 7.1(L)  Hematocrit 35.0 - 47.0 % 22.9(L) 23.9(L) 21.5(L)  Platelets 150 - 440 K/uL 420 445(H) 346    CXR: No new film   IMP: Very severe COPD Prolonged mechanical ventilation Failure to wean Tracheostomy tube status ICU acquired anemia G-tube status Anxiety/panic disorder Major depression   PLAN/REC: Cont vent support - settings reviewed and/or adjusted Cont vent bundle Continue to wean in PSV mode as tolerated ATC as tolerated Continue nebulized steroids and bronchodilators Continue DVT px with SQ heparin Monitor CBC intermittently Transfuse per usual guidelines  Cont Duragesic, scheduled clonazepam, scheduled quetiapine, paroxetine Continue alprazolam as needed Working on transfer to LTAC/vent  SNF  Merton Border, MD PCCM service Mobile [phone] Pager [phone] 11/13/2017 4:25 PM

## 2017-11-13 NOTE — Progress Notes (Signed)
Patient ID: Michelle [name], female   DOB: 1960-09-17, 57 y.o.   MRN: [id number]  [employer] PROGRESS NOTE  Michelle [name] QAS:[id number] DOB: May 07, 1961 DOA: 09/30/2017 PCP: [facility]  HPI/Subjective: Patient feeling okay.  On the ventilator 40% FiO2.  Objective: Vitals:   11/13/17 1400 11/13/17 1500  BP: 138/72 130/71  Pulse: 92 96  Resp: (!) 27 17  Temp:    SpO2: 96% 96%    Filed Weights   11/11/17 0500 11/12/17 0440 11/13/17 0500  Weight: 62.2 kg (137 lb 2 oz) 62.3 kg (137 lb 5.6 oz) 62.8 kg (138 lb 7.2 oz)    ROS: Review of Systems  Unable to perform ROS: Acuity of condition  Respiratory: Negative for shortness of breath.   Cardiovascular: Negative for chest pain.  Gastrointestinal: Negative for abdominal pain.   Exam: Physical Exam  HENT:  Nose: No mucosal edema.  Mouth/Throat: No oropharyngeal exudate or posterior oropharyngeal edema.  Eyes: Pupils are equal, round, and reactive to light. Conjunctivae, EOM and lids are normal.  Neck: No JVD present. Carotid bruit is not present. No edema present. No thyroid mass and no thyromegaly present.  Cardiovascular: S1 normal and S2 normal. Exam reveals no gallop.  No murmur heard. Pulses:      Dorsalis pedis pulses are 2+ on the right side, and 2+ on the left side.  Respiratory: No respiratory distress. She has decreased breath sounds in the right lower field and the left lower field. She has no wheezes. She has no rhonchi. She has no rales.  GI: Soft. Bowel sounds are normal. There is no tenderness.  Musculoskeletal:       Right wrist: She exhibits tenderness.       Left wrist: She exhibits tenderness.       Right ankle: She exhibits swelling.       Left ankle: She exhibits swelling.  Lymphadenopathy:    She has no cervical adenopathy.  Neurological: She is alert.  Patient unable to lift her arms or legs up off the bed.  Skin: Skin is warm. Nails show no clubbing.  Stage II decubitus ulcer as  per nursing staff.  Psychiatric: She has a normal mood and affect.      Data Reviewed: Basic Metabolic Panel: Recent Labs  Lab 11/07/17 0619 11/10/17 0526  NA 137 136  K 3.5 4.1  CL 100* 98*  CO2 31 32  GLUCOSE 154* 160*  BUN 26* 30*  CREATININE <0.30* <0.30*  CALCIUM 8.5* 8.8*   CBC: Recent Labs  Lab 11/07/17 0619 11/10/17 0526 11/11/17 0542 11/13/17 0353  WBC 5.5 7.8 8.1 7.8  NEUTROABS 4.4 6.3  --  5.7  HGB 7.9* 7.1* 7.7* 7.5*  HCT 24.4* 21.5* 23.9* 22.9*  MCV 88.1 87.9 88.3 88.0  PLT 282 346 445* 420   BNP (last 3 results) Recent Labs    06/29/17 0412 08/20/17 1337 09/30/17 1705  BNP 38.0 33.0 66.0     CBG: Recent Labs  Lab 11/12/17 2008 11/13/17 0000 11/13/17 0335 11/13/17 0736 11/13/17 1210  GLUCAP 105* 116* 106* 115* 111*    Recent Results (from the past 240 hour(s))  Culture, respiratory (NON-Expectorated)     Status: None   Collection Time: 11/04/17 12:20 PM  Result Value Ref Range Status   Specimen Description   Final    SPUTUM Performed at [facility], [address] [place], [place] [address]    Special Requests   Final  NONE Performed at [facility], [place], [place] [address]    Gram Stain   Final    ABUNDANT WBC PRESENT,BOTH PMN AND MONONUCLEAR RARE GRAM POSITIVE COCCI Performed at [facility], [place] [address], [place], [place] [address]    Culture   Final    RARE FUNGUS (MOLD) ISOLATED, PROBABLE CONTAMINANT/COLONIZER (SAPROPHYTE). CONTACT MICROBIOLOGY IF FURTHER IDENTIFICATION REQUIRED [phone].   Report Status 11/06/2017 FINAL  Final     Scheduled Meds: . aspirin  81 mg Oral Daily  . budesonide (PULMICORT) nebulizer solution  0.5 mg Nebulization BID  . chlorhexidine gluconate (MEDLINE KIT)  15 mL Mouth Rinse BID  . clonazePAM  1 mg Per Tube BID  . famotidine  20 mg Per Tube Daily  . feeding supplement (PRO-STAT SUGAR FREE 64)  30 mL Oral Daily  . feeding supplement  (VITAL 1.5 CAL)  1,000 mL Per Tube Q24H  . fentaNYL  75 mcg Transdermal Q72H  . free water  100 mL Per Tube Q8H  . heparin  5,000 Units Subcutaneous Q8H  . insulin aspart  0-9 Units Subcutaneous Q4H  . ipratropium-albuterol  3 mL Nebulization Q6H  . mouth rinse  15 mL Mouth Rinse 10 times per day  . multivitamin  15 mL Per Tube Daily  . nutrition supplement (JUVEN)  1 packet Per Tube BID  . nystatin cream   Topical BID  . PARoxetine  20 mg Per Tube BID  . QUEtiapine  50 mg Per Tube BID  . senna-docusate  1 tablet Per Tube BID  . sodium chloride flush  10-40 mL Intracatheter Q12H  . valproic acid  500 mg Per Tube BID   Continuous Infusions:  Assessment/Plan:  1. Acute on chronic hypoxic respiratory failure.  Status post tracheostomy on Oct 17, 2017.  Still unable to wean off off the ventilator.  Still on 40% FiO2. 2. Acute on chronic COPD exacerbation.  Continue nebulizer treatments with inhaled corticosteroids. 3. Anxiety.  Continue psychiatric medications 4. Stage II decubitus ulcer as per nursing staff 5. 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  Unable to move her arms or legs.  Code Status:     Code Status Orders  (From admission, onward)        Start     Ordered   10/28/17 1707  Limited resuscitation (code)  Continuous    Question Answer Comment  In the event of cardiac or respiratory ARREST: Initiate Code Blue, Call Rapid Response No   In the event of cardiac or respiratory ARREST: Perform CPR No   In the event of cardiac or respiratory ARREST: Perform Intubation/Mechanical Ventilation Yes   In the event of cardiac or respiratory ARREST: Use NIPPV/BiPAp only if indicated No   In the event of cardiac or respiratory ARREST: Administer ACLS medications if indicated No   In the event of cardiac or respiratory ARREST: Perform Defibrillation or Cardioversion if indicated No   Comments Continue trach and ventilator. No resusitation if heart stops.      10/28/17 1711     Code Status History    Date Active Date Inactive Code Status Order ID Comments User Context   09/30/2017 1945 10/28/2017 1711 Full Code [id number]  [name], MD Inpatient   08/20/2017 1713 08/24/2017 1743 Full Code [id number]  Dustin Flock, MD Inpatient   06/29/2017 0625 07/02/2017 2008 Full Code [id number]  Harrie Foreman, MD Inpatient   02/23/2017 0241 02/26/2017 1634 Full Code [id number]  Harrie Foreman,  MD Inpatient   01/01/2017 1144 01/04/2017 1354 Full Code [id number]  Bettey Costa, MD Inpatient   05/22/2016 2155 05/27/2016 1645 Full Code [id number]  Fritzi Mandes, MD Inpatient   04/23/2016 2020 04/27/2016 1848 Full Code [id number]  Hower, Aaron Mose, MD ED   02/07/2016 1922 02/10/2016 1835 Full Code [id number]  Gladstone Lighter, MD Inpatient   11/29/2015 0519 12/01/2015 1609 Full Code [id number]  Harrie Foreman, MD Inpatient   11/05/2015 0911 11/07/2015 1606 Full Code [id number]  Hillary Bow, MD ED   10/24/2014 1522 10/27/2014 1630 Full Code [id number]  Hillary Bow, MD Inpatient     Family Communication: As per critical care specialist Disposition Plan: No plan because the patient does not have a pair source for LTAC.  Consultants:  Critical care specialist  Time spent: 24 minutes  [place]

## 2017-11-13 NOTE — Clinical Social Work Note (Signed)
Sonia Baller at [facility] called [name] this morning and [name] stated that her sister, [name], was taking charge of doing the needed paperwork for admission to [facility] facility. CSW called [name] who stated she had provided all the necessary documents and asked the ICU nurse to fax it. CSW informed that they never were received at the [facility] facility and that they would need to be re-faxed. CSW stated that when she came up to the hospital today, to have CSW called and CSW will fax the paperwork to the [facility] facility. In addition, CSW spoke with Sonia Baller at [facility] and she is going to speak with their ambulance transport service to see if they will transport patient when time up to their facility. Shela Leff MSW,LCSW [phone]

## 2017-11-14 DIAGNOSIS — Z931 Gastrostomy status: Secondary | ICD-10-CM

## 2017-11-14 DIAGNOSIS — Z7189 Other specified counseling: Secondary | ICD-10-CM

## 2017-11-14 DIAGNOSIS — Z515 Encounter for palliative care: Secondary | ICD-10-CM

## 2017-11-14 DIAGNOSIS — N179 Acute kidney failure, unspecified: Secondary | ICD-10-CM

## 2017-11-14 DIAGNOSIS — R1310 Dysphagia, unspecified: Secondary | ICD-10-CM

## 2017-11-14 DIAGNOSIS — R601 Generalized edema: Secondary | ICD-10-CM

## 2017-11-14 LAB — CBC WITH DIFFERENTIAL/PLATELET
BAND NEUTROPHILS: 3 %
BASOS PCT: 0 %
BLASTS: 0 %
Basophils Absolute: 0 10*3/uL (ref 0–0.1)
EOS ABS: 0.1 10*3/uL (ref 0–0.7)
Eosinophils Relative: 1 %
HEMATOCRIT: 23.6 % — AB (ref 35.0–47.0)
Hemoglobin: 7.5 g/dL — ABNORMAL LOW (ref 12.0–16.0)
LYMPHS ABS: 1.2 10*3/uL (ref 1.0–3.6)
Lymphocytes Relative: 18 %
MCH: 28.1 pg (ref 26.0–34.0)
MCHC: 31.8 g/dL — AB (ref 32.0–36.0)
MCV: 88.2 fL (ref 80.0–100.0)
METAMYELOCYTES PCT: 5 %
MONO ABS: 0.4 10*3/uL (ref 0.2–0.9)
MONOS PCT: 6 %
Myelocytes: 1 %
NEUTROS ABS: 5 10*3/uL (ref 1.4–6.5)
Neutrophils Relative %: 66 %
Other: 0 %
PLATELETS: 416 10*3/uL (ref 150–440)
Promyelocytes Relative: 0 %
RBC: 2.67 MIL/uL — ABNORMAL LOW (ref 3.80–5.20)
RDW: 17.3 % — AB (ref 11.5–14.5)
WBC: 6.7 10*3/uL (ref 3.6–11.0)
nRBC: 1 /100 WBC — ABNORMAL HIGH

## 2017-11-14 LAB — GLUCOSE, CAPILLARY
GLUCOSE-CAPILLARY: 115 mg/dL — AB (ref 65–99)
GLUCOSE-CAPILLARY: 111 mg/dL — AB (ref 65–99)
Glucose-Capillary: 103 mg/dL — ABNORMAL HIGH (ref 65–99)
Glucose-Capillary: 105 mg/dL — ABNORMAL HIGH (ref 65–99)
Glucose-Capillary: 106 mg/dL — ABNORMAL HIGH (ref 65–99)
Glucose-Capillary: 85 mg/dL (ref 65–99)

## 2017-11-14 MED ORDER — QUETIAPINE FUMARATE 25 MG PO TABS
25.0000 mg | ORAL_TABLET | Freq: Two times a day (BID) | ORAL | Status: DC
Start: 2017-11-14 — End: 2017-11-18
  Administered 2017-11-14 – 2017-11-18 (×8): 25 mg
  Filled 2017-11-14 (×8): qty 1

## 2017-11-14 NOTE — Clinical Social Work Note (Signed)
CSW contacted by Sonia Baller at [facility] and she told CSW that the daughter, [name], called her to say that they have changed their mind and wanted to have patient go to [facility]. CSW called [name] and explained again that [facility] does not have a bed and that the [facility] facility was the closest. [name] said okay to continuing with the [place] facility. [name] brought [place] additional info needed by Sonia Baller at the facility and faxed it to [facility].  Shela Leff MSW,LCSW [phone]

## 2017-11-14 NOTE — Clinical Social Work Note (Signed)
Now CSW has received a call from [place], one of the patient's daughters, and stated that she and her sister have decided that the [place] facility has too low of ratings and that they will not send her there. CSW advised that the only other option at this time would be for her to be taken home on the vent and cared for at home. [name] has agreed to this knowing that patient will require 24/7 care and family will have to assist with the ventilator management. RN CM has been notified. Shela Leff MSW,LCSW [phone]

## 2017-11-14 NOTE — Progress Notes (Signed)
Patient ID: Michelle [name], female   DOB: Feb 24, 1961, 57 y.o.   MRN: [id number]  [employer] PROGRESS NOTE  GIOVANNI BATH ANV:[id number] DOB: February 16, 1961 DOA: 09/30/2017 PCP: [facility], [facility]  HPI/Subjective: When I saw the patient this morning she was barely able to open her eyes.  Objective: Vitals:   11/14/17 1300 11/14/17 1400  BP: 125/72 112/75  Pulse: 97 97  Resp: 16 15  Temp:    SpO2: 94% 95%    Filed Weights   11/12/17 0440 11/13/17 0500 11/14/17 0443  Weight: 62.3 kg (137 lb 5.6 oz) 62.8 kg (138 lb 7.2 oz) 59.8 kg (131 lb 13.4 oz)    ROS: Review of Systems  Unable to perform ROS: Acuity of condition   Exam: Physical Exam  HENT:  Nose: No mucosal edema.  Mouth/Throat: No oropharyngeal exudate or posterior oropharyngeal edema.  Eyes: Pupils are equal, round, and reactive to light. Conjunctivae, EOM and lids are normal.  Neck: No JVD present. Carotid bruit is not present. No edema present. No thyroid mass and no thyromegaly present.  Cardiovascular: S1 normal and S2 normal. Exam reveals no gallop.  No murmur heard. Pulses:      Dorsalis pedis pulses are 2+ on the right side, and 2+ on the left side.  Respiratory: No respiratory distress. She has decreased breath sounds in the right lower field and the left lower field. She has no wheezes. She has no rhonchi. She has no rales.  GI: Soft. Bowel sounds are normal. There is no tenderness.  Musculoskeletal:       Right wrist: She exhibits tenderness.       Left wrist: She exhibits tenderness.       Right ankle: She exhibits swelling.       Left ankle: She exhibits swelling.  Lymphadenopathy:    She has no cervical adenopathy.  Neurological: She is alert.  Patient unable to lift her arms or legs up off the bed.  Skin: Skin is warm. Nails show no clubbing.  Stage II decubitus ulcer as per nursing staff.  Psychiatric: She has a normal mood and affect.      Data Reviewed: Basic Metabolic  Panel: Recent Labs  Lab 11/10/17 0526  NA 136  K 4.1  CL 98*  CO2 32  GLUCOSE 160*  BUN 30*  CREATININE <0.30*  CALCIUM 8.8*   CBC: Recent Labs  Lab 11/10/17 0526 11/11/17 0542 11/13/17 0353 11/14/17 0356  WBC 7.8 8.1 7.8 6.7  NEUTROABS 6.3  --  5.7 5.0  HGB 7.1* 7.7* 7.5* 7.5*  HCT 21.5* 23.9* 22.9* 23.6*  MCV 87.9 88.3 88.0 88.2  PLT 346 445* 420 416   BNP (last 3 results) Recent Labs    06/29/17 0412 08/20/17 1337 09/30/17 1705  BNP 38.0 33.0 66.0     CBG: Recent Labs  Lab 11/13/17 2000 11/13/17 2328 11/14/17 0331 11/14/17 0739 11/14/17 1147  GLUCAP 114* 88 115* 106* 103*      Scheduled Meds: . budesonide (PULMICORT) nebulizer solution  0.5 mg Nebulization BID  . chlorhexidine gluconate (MEDLINE KIT)  15 mL Mouth Rinse BID  . clonazePAM  1 mg Per Tube BID  . famotidine  20 mg Per Tube Daily  . feeding supplement (PRO-STAT SUGAR FREE 64)  30 mL Oral Daily  . feeding supplement (VITAL 1.5 CAL)  1,000 mL Per Tube Q24H  . fentaNYL  75 mcg Transdermal Q72H  . free water  100 mL Per Tube Q8H  .  heparin  5,000 Units Subcutaneous Q8H  . insulin aspart  0-9 Units Subcutaneous Q4H  . ipratropium-albuterol  3 mL Nebulization Q6H  . mouth rinse  15 mL Mouth Rinse 10 times per day  . multivitamin  15 mL Per Tube Daily  . nutrition supplement (JUVEN)  1 packet Per Tube BID  . nystatin cream   Topical BID  . PARoxetine  20 mg Per Tube BID  . QUEtiapine  50 mg Per Tube BID  . senna-docusate  1 tablet Per Tube BID  . valproic acid  500 mg Per Tube BID   Continuous Infusions:  Assessment/Plan:  1. Acute on chronic hypoxic respiratory failure.  Status post tracheostomy on Oct 17, 2017.  Still unable to wean off off the ventilator.  Still on 40% FiO2. the patient was on trach collar for 1/2-year hours yesterday as per respiratory.  Currently on the ventilator this morning when I saw her. 2. Fever.  No white blood cell count at this point.  Chest x-ray  ordered for tomorrow morning.  Continue to watch temperature curve. 3. Acute on chronic COPD exacerbation.  Continue nebulizer treatments with inhaled corticosteroids. 4. Anxiety.  Continue psychiatric medications 5. Stage II decubitus ulcer as per nursing staff 6. 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  Unable to move her arms or legs.  Code Status:     Code Status Orders  (From admission, onward)        Start     Ordered   10/28/17 1707  Limited resuscitation (code)  Continuous    Question Answer Comment  In the event of cardiac or respiratory ARREST: Initiate Code Blue, Call Rapid Response No   In the event of cardiac or respiratory ARREST: Perform CPR No   In the event of cardiac or respiratory ARREST: Perform Intubation/Mechanical Ventilation Yes   In the event of cardiac or respiratory ARREST: Use NIPPV/BiPAp only if indicated No   In the event of cardiac or respiratory ARREST: Administer ACLS medications if indicated No   In the event of cardiac or respiratory ARREST: Perform Defibrillation or Cardioversion if indicated No   Comments Continue trach and ventilator. No resusitation if heart stops.      10/28/17 1711    Code Status History    Date Active Date Inactive Code Status Order ID Comments User Context   09/30/2017 1945 10/28/2017 1711 Full Code [id number]  [name], MD Inpatient   08/20/2017 1713 08/24/2017 1743 Full Code [id number]  Dustin Flock, MD Inpatient   06/29/2017 0625 07/02/2017 2008 Full Code [id number]  Harrie Foreman, MD Inpatient   02/23/2017 0241 02/26/2017 1634 Full Code [id number]  Harrie Foreman, MD Inpatient   01/01/2017 1144 01/04/2017 1354 Full Code [id number]  Bettey Costa, MD Inpatient   05/22/2016 2155 05/27/2016 1645 Full Code [id number]  Fritzi Mandes, MD Inpatient   04/23/2016 2020 04/27/2016 1848 Full Code [id number]  Hower, Aaron Mose, MD ED   02/07/2016 1922 02/10/2016 1835 Full Code [id number]  Gladstone Lighter, MD Inpatient    11/29/2015 0519 12/01/2015 1609 Full Code [id number]  Harrie Foreman, MD Inpatient   11/05/2015 0911 11/07/2015 1606 Full Code [id number]  Hillary Bow, MD ED   10/24/2014 1522 10/27/2014 1630 Full Code [id number]  Hillary Bow, MD Inpatient     Family Communication: As per critical care specialist Disposition Plan: No plan because the patient does not have a payer source for LTAC.  Consultants:  Critical care specialist  Time spent: 26 minutes  Dorrine Montone  [employer]  [employer]

## 2017-11-14 NOTE — Progress Notes (Signed)
PT Cancellation Note  Patient Details Name: Michelle [name] MRN: [id number] DOB: [birth year]   Cancelled Treatment:    Reason Eval/Treat Not Completed: Fatigue/lethargy limiting ability to participate. Chart reviewed, RN consulted. PT present for repositioning of patient, oral hygiene. Pt remains asleep throughout. Gentle RUE ROM performed, verbal stimulus, pt remains asleep. Will attempt at later time once pt is better able to participate.    4:18 PM, 11/14/17 Etta Grandchild, PT, DPT Physical Therapist - [facility]  [phone] ([place])    Buccola,Allan C 11/14/2017, 4:17 PM

## 2017-11-14 NOTE — Progress Notes (Signed)
Tolerated ATC x90 minutes yesterday, but not today.  Presently, appears comfortable on PS 5 cm H2O.  Somnolent upon my evaluation.    Vitals:   11/14/17 0845 11/14/17 0900 11/14/17 1000 11/14/17 1105  BP: 122/67 128/61 122/65   Pulse: (!) 103 (!) 105 93   Resp: 16 (!) 32 12   Temp: 100 F (37.8 C)     TempSrc: Axillary     SpO2: 93% (!) 86% 93% 96%  Weight:      Height:       NAD HEENT WNL No JVD, trach site clean No wheezes or other adventitious sounds Regular, no M NABS, soft, NT Extremities warm without edema Diffusely weak, no focal deficits   BMP Latest Ref Rng & Units 11/10/2017 11/07/2017 11/04/2017  Glucose 65 - 99 mg/dL 160(H) 154(H) 148(H)  BUN 6 - 20 mg/dL 30(H) 26(H) 20  Creatinine 0.44 - 1.00 mg/dL <0.30(L) <0.30(L) <0.30(L)  Sodium 135 - 145 mmol/L 136 137 137  Potassium 3.5 - 5.1 mmol/L 4.1 3.5 3.3(L)  Chloride 101 - 111 mmol/L 98(L) 100(L) 103  CO2 22 - 32 mmol/L 32 31 28  Calcium 8.9 - 10.3 mg/dL 8.8(L) 8.5(L) 8.2(L)    CBC Latest Ref Rng & Units 11/14/2017 11/13/2017 11/11/2017  WBC 3.6 - 11.0 K/uL 6.7 7.8 8.1  Hemoglobin 12.0 - 16.0 g/dL 7.5(L) 7.5(L) 7.7(L)  Hematocrit 35.0 - 47.0 % 23.6(L) 22.9(L) 23.9(L)  Platelets 150 - 440 K/uL 416 420 445(H)    CXR: No new film   IMP: Very severe COPD Prolonged mechanical ventilation Failure to wean Tracheostomy tube status ICU acquired anemia without evidence of bleeding G-tube status Anxiety/panic disorder Major depression  PLAN/REC: Cont vent support - settings reviewed and/or adjusted Cont vent bundle Daily SBT if/when meets criteria  PSV <> ATC as tolerated Continue nebulized steroids and bronchodilators DVT px: SQ heparin Monitor CBC intermittently Transfuse per usual guidelines  Cont Duragesic, scheduled clonazepam, scheduled quetiapine, paroxetine Continue PRN alprazolam Reportedly, she has a bed available at a vent SNF in [place] but we are working on transportation  Merton Border, MD PCCM  service Mobile [phone] Pager [phone] 11/14/2017 12:16 PM

## 2017-11-14 NOTE — Progress Notes (Signed)
Pt was placed on a 40% aerosol trach collar. She immediately increased her WOB and her Sa02 dropped to a low of 79%. She was placed back on the vent but on PSV of 5 and 5 pressure support.

## 2017-11-15 ENCOUNTER — Inpatient Hospital Stay: Payer: Medicaid Other

## 2017-11-15 LAB — COMPREHENSIVE METABOLIC PANEL
ALBUMIN: 2.2 g/dL — AB (ref 3.5–5.0)
ALK PHOS: 80 U/L (ref 38–126)
ALT: 17 U/L (ref 14–54)
AST: 17 U/L (ref 15–41)
Anion gap: 7 (ref 5–15)
BUN: 25 mg/dL — AB (ref 6–20)
CALCIUM: 9.1 mg/dL (ref 8.9–10.3)
CO2: 36 mmol/L — AB (ref 22–32)
Chloride: 96 mmol/L — ABNORMAL LOW (ref 101–111)
GLUCOSE: 128 mg/dL — AB (ref 65–99)
Potassium: 4.1 mmol/L (ref 3.5–5.1)
SODIUM: 139 mmol/L (ref 135–145)
TOTAL PROTEIN: 5.9 g/dL — AB (ref 6.5–8.1)
Total Bilirubin: 0.4 mg/dL (ref 0.3–1.2)

## 2017-11-15 LAB — GLUCOSE, CAPILLARY
GLUCOSE-CAPILLARY: 124 mg/dL — AB (ref 65–99)
GLUCOSE-CAPILLARY: 89 mg/dL (ref 65–99)
GLUCOSE-CAPILLARY: 97 mg/dL (ref 65–99)
Glucose-Capillary: 117 mg/dL — ABNORMAL HIGH (ref 65–99)
Glucose-Capillary: 99 mg/dL (ref 65–99)
Glucose-Capillary: 97 mg/dL (ref 65–99)

## 2017-11-15 LAB — CBC
HCT: 24.6 % — ABNORMAL LOW (ref 35.0–47.0)
HEMOGLOBIN: 7.8 g/dL — AB (ref 12.0–16.0)
MCH: 27.8 pg (ref 26.0–34.0)
MCHC: 31.6 g/dL — ABNORMAL LOW (ref 32.0–36.0)
MCV: 88 fL (ref 80.0–100.0)
Platelets: 379 10*3/uL (ref 150–440)
RBC: 2.8 MIL/uL — AB (ref 3.80–5.20)
RDW: 16.7 % — ABNORMAL HIGH (ref 11.5–14.5)
WBC: 6.9 10*3/uL (ref 3.6–11.0)

## 2017-11-15 NOTE — Progress Notes (Signed)
PT Cancellation Note  Patient Details Name: Michelle [name] MRN: [id number] DOB: Mar 22, 1961   Cancelled Treatment:    Reason Eval/Treat Not Completed: Fatigue/lethargy limiting ability to participate(Chart reviewed, RN consulted at length. Pt remains somnolent this date, more so than previous. Will hold eval at this time, attempts again at later date/time as patient is able to participate. )  1:29 PM, 11/15/17 Etta Grandchild, PT, DPT Physical Therapist - [facility]  [phone] ([place])     Stevens C 11/15/2017, 1:29 PM

## 2017-11-15 NOTE — Progress Notes (Signed)
   11/15/17 1040  Clinical Encounter Type  Visited With Patient  Visit Type Follow-up  Spiritual Encounters  Spiritual Needs Prayer;Emotional   Chaplain spoke aloud as patient's eyes were closed; patient opened her eyes and smiled widely.  Patient indicated that she was open to chaplain visit.  Chaplain offered to pray with patient, which she accepted.  Chaplain asked if God language was agreeable to patient while praying, she nodded yes.  At end of prayer, she said something inaudible.  Chaplain could not hear what was said, pulled up a chair, apologized and asked what patient said.  Patient shook her head.  Chaplain asked if patient would like a clipboard to write on, she nodded yes.  Chaplain retrieved clip board and pencil from chair.  Patient let clipboard and pencil lie where they were.  Chaplain maintained pastoral presence, offering energetic prayer.  Chaplain asked if patient wanted chaplain to put the pencil in her hand; patient declined.  Chaplain inquired if there was anything that patient would like to communciate about and patient moved her head in the negative.  Chaplain and patient sat in silence, being present together.  At end of visit, patient expressed openness to continued chaplain follow up.

## 2017-11-15 NOTE — Progress Notes (Signed)
Resumed PRVC settings due to patients low respiratory rate of 4-6 BPM.

## 2017-11-15 NOTE — Care Management (Addendum)
RNCM received call from [place] stating that family wants to take patient home instead of vent/snf.  Daughter [name] assured RNCM that someone would be with patient 24/7. [name] did not have preference of home health agencies.  I have reached out to [facility] duty and home health for trach/feeds/PT/OT/RN/SW/NA/ST and I have reached out to [facility] with [facility] for hospital bed, vent/trach supplies/suction, feeding supplies and O2.   Hospital bed needed:  Patient spends 95% of the time in bed. Problems with aspiration causes frequent choking. Torso required to be elevated at least 30 degrees or more to prevent aspiration. Bed wedges do not provide adequate elevation to resolve issues with aspiration. Choking requires immediate changes in body position which cannot be achieved with a normal bed.  Per ICU RN, patient is hemodynamically stable. She is still requiring artificial ventilation support.  At 1021- RNCM receivd call from J[place] with [facility] [phone] stating they can take patient but barriers will be discussing plan with caregivers and Medicaid arrangements which will take a couple of days (~Tuesday?).  jermaine with [facility] is working on home DME. At 1212P: RNCM reached out to Dietician for assistance with home g-tube feeding orders for [facility].  At 1253P: RNCM notified by Adela Lank with [facility] home health that home health can also be arranged. I am checking to see if Medicaid will pay for both private duty and home health services- Patient cannot have both home health and private duty.  Vent home assessment and education with patient's caregivers set up for Thursday 11/21/17 per family arrangement.

## 2017-11-15 NOTE — Progress Notes (Signed)
Patient ID: Michelle [name], female   DOB: 06-13-1960, 57 y.o.   MRN: [id number]  [employer] PROGRESS NOTE  AMELIA BURGARD AJG:[id number] DOB: December 02, 1960 DOA: 09/30/2017 PCP: [facility], [facility]  HPI/Subjective: Patient open her eyes but went right back to sleep.  Objective: Vitals:   11/15/17 1400 11/15/17 1500  BP: 105/66 118/70  Pulse: 84 93  Resp: 16 19  Temp:    SpO2: 97% 95%    Filed Weights   11/12/17 0440 11/13/17 0500 11/14/17 0443  Weight: 62.3 kg (137 lb 5.6 oz) 62.8 kg (138 lb 7.2 oz) 59.8 kg (131 lb 13.4 oz)    ROS: Review of Systems  Unable to perform ROS: Acuity of condition   Exam: Physical Exam  HENT:  Nose: No mucosal edema.  Mouth/Throat: No oropharyngeal exudate or posterior oropharyngeal edema.  Eyes: Pupils are equal, round, and reactive to light. Conjunctivae, EOM and lids are normal.  Neck: No JVD present. Carotid bruit is not present. No edema present. No thyroid mass and no thyromegaly present.  Cardiovascular: S1 normal and S2 normal. Exam reveals no gallop.  No murmur heard. Pulses:      Dorsalis pedis pulses are 2+ on the right side, and 2+ on the left side.  Respiratory: No respiratory distress. She has decreased breath sounds in the right lower field and the left lower field. She has no wheezes. She has no rhonchi. She has no rales.  GI: Soft. Bowel sounds are normal. There is no tenderness.  Musculoskeletal:       Right wrist: She exhibits swelling.       Left wrist: She exhibits swelling.       Right ankle: She exhibits swelling.       Left ankle: She exhibits swelling.  Lymphadenopathy:    She has no cervical adenopathy.  Neurological: She is alert.  Patient unable to lift her arms or legs up off the bed.  Skin: Skin is warm. Nails show no clubbing.  Stage II decubitus ulcer as per nursing staff.  Psychiatric: She has a normal mood and affect.      Data Reviewed: Basic Metabolic Panel: Recent Labs  Lab  11/10/17 0526 11/15/17 0435  NA 136 139  K 4.1 4.1  CL 98* 96*  CO2 32 36*  GLUCOSE 160* 128*  BUN 30* 25*  CREATININE <0.30* <0.30*  CALCIUM 8.8* 9.1   CBC: Recent Labs  Lab 11/10/17 0526 11/11/17 0542 11/13/17 0353 11/14/17 0356 11/15/17 0435  WBC 7.8 8.1 7.8 6.7 6.9  NEUTROABS 6.3  --  5.7 5.0  --   HGB 7.1* 7.7* 7.5* 7.5* 7.8*  HCT 21.5* 23.9* 22.9* 23.6* 24.6*  MCV 87.9 88.3 88.0 88.2 88.0  PLT 346 445* 420 416 379   BNP (last 3 results) Recent Labs    06/29/17 0412 08/20/17 1337 09/30/17 1705  BNP 38.0 33.0 66.0     CBG: Recent Labs  Lab 11/14/17 2337 11/15/17 0402 11/15/17 0811 11/15/17 1152 11/15/17 1450  GLUCAP 85 117* 99 97 97      Scheduled Meds: . budesonide (PULMICORT) nebulizer solution  0.5 mg Nebulization BID  . chlorhexidine gluconate (MEDLINE KIT)  15 mL Mouth Rinse BID  . clonazePAM  1 mg Per Tube BID  . famotidine  20 mg Per Tube Daily  . feeding supplement (PRO-STAT SUGAR FREE 64)  30 mL Oral Daily  . feeding supplement (VITAL 1.5 CAL)  1,000 mL Per Tube Q24H  . fentaNYL  75 mcg Transdermal Q72H  . free water  100 mL Per Tube Q8H  . heparin  5,000 Units Subcutaneous Q8H  . insulin aspart  0-9 Units Subcutaneous Q4H  . ipratropium-albuterol  3 mL Nebulization Q6H  . mouth rinse  15 mL Mouth Rinse 10 times per day  . multivitamin  15 mL Per Tube Daily  . nutrition supplement (JUVEN)  1 packet Per Tube BID  . nystatin cream   Topical BID  . PARoxetine  20 mg Per Tube BID  . QUEtiapine  25 mg Per Tube BID  . senna-docusate  1 tablet Per Tube BID  . valproic acid  500 mg Per Tube BID   Continuous Infusions:  Assessment/Plan:  1. Acute on chronic hypoxic respiratory failure.  Status post tracheostomy on Oct 17, 2017.  Still unable to wean off off the ventilator.  Still on 35% FiO2.   patient does not have a pair source for LTAC facility.   2. Fever.  Temperature curve has trended better.  No white blood cell count at this  point.   3. Acute on chronic COPD exacerbation.  Continue nebulizer treatments with inhaled corticosteroids. 4. Anxiety.  Continue psychiatric medications 5. Stage II decubitus ulcer as per nursing staff 6. 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  Unable to move her arms or legs.  Code Status:     Code Status Orders  (From admission, onward)        Start     Ordered   10/28/17 1707  Limited resuscitation (code)  Continuous    Question Answer Comment  In the event of cardiac or respiratory ARREST: Initiate Code Blue, Call Rapid Response No   In the event of cardiac or respiratory ARREST: Perform CPR No   In the event of cardiac or respiratory ARREST: Perform Intubation/Mechanical Ventilation Yes   In the event of cardiac or respiratory ARREST: Use NIPPV/BiPAp only if indicated No   In the event of cardiac or respiratory ARREST: Administer ACLS medications if indicated No   In the event of cardiac or respiratory ARREST: Perform Defibrillation or Cardioversion if indicated No   Comments Continue trach and ventilator. No resusitation if heart stops.      10/28/17 1711    Code Status History    Date Active Date Inactive Code Status Order ID Comments User Context   09/30/2017 1945 10/28/2017 1711 Full Code [id number]  [name], MD Inpatient   08/20/2017 1713 08/24/2017 1743 Full Code [id number]  Dustin Flock, MD Inpatient   06/29/2017 0625 07/02/2017 2008 Full Code [id number]  Harrie Foreman, MD Inpatient   02/23/2017 0241 02/26/2017 1634 Full Code [id number]  Harrie Foreman, MD Inpatient   01/01/2017 1144 01/04/2017 1354 Full Code [id number]  Bettey Costa, MD Inpatient   05/22/2016 2155 05/27/2016 1645 Full Code [id number]  Fritzi Mandes, MD Inpatient   04/23/2016 2020 04/27/2016 1848 Full Code [id number]  Hower, Aaron Mose, MD ED   02/07/2016 1922 02/10/2016 1835 Full Code [id number]  Gladstone Lighter, MD Inpatient   11/29/2015 0519 12/01/2015 1609 Full Code [id number]  Harrie Foreman, MD Inpatient   11/05/2015 0911 11/07/2015 1606 Full Code [id number]  Hillary Bow, MD ED   10/24/2014 1522 10/27/2014 1630 Full Code [id number]  Hillary Bow, MD Inpatient     Family Communication: As per critical care specialist Disposition Plan: No plan because the patient does not have a payer source for LTAC.  Consultants:  Critical care specialist  Time spent: 25 minutes  Mercadez Heitman [employer]

## 2017-11-15 NOTE — Progress Notes (Signed)
Patient was somnolent yesterday, reduced dose of Seroquel. More awake this morning  Vitals:   11/15/17 0100 11/15/17 0200 11/15/17 0206 11/15/17 0339  BP: (!) 108/56 (!) 105/58    Pulse: 91 86  78  Resp: 16 16  16   Temp:      TempSrc:      SpO2: 96% 98% 98% 100%  Weight:      Height:       NAD HEENT WNL No JVD, trach site clean No wheezes or other adventitious sounds Regular, no M NABS, soft, NT Extremities warm without edema Diffusely weak, no focal deficits   BMP Latest Ref Rng & Units 11/15/2017 11/10/2017 11/07/2017  Glucose 65 - 99 mg/dL 128(H) 160(H) 154(H)  BUN 6 - 20 mg/dL 25(H) 30(H) 26(H)  Creatinine 0.44 - 1.00 mg/dL <0.30(L) <0.30(L) <0.30(L)  Sodium 135 - 145 mmol/L 139 136 137  Potassium 3.5 - 5.1 mmol/L 4.1 4.1 3.5  Chloride 101 - 111 mmol/L 96(L) 98(L) 100(L)  CO2 22 - 32 mmol/L 36(H) 32 31  Calcium 8.9 - 10.3 mg/dL 9.1 8.8(L) 8.5(L)    CBC Latest Ref Rng & Units 11/15/2017 11/14/2017 11/13/2017  WBC 3.6 - 11.0 K/uL 6.9 6.7 7.8  Hemoglobin 12.0 - 16.0 g/dL 7.8(L) 7.5(L) 7.5(L)  Hematocrit 35.0 - 47.0 % 24.6(L) 23.6(L) 22.9(L)  Platelets 150 - 440 K/uL 379 416 420    CXR: No new film   IMP: Very severe COPD Prolonged mechanical ventilation Failure to wean Tracheostomy tube status ICU acquired anemia without evidence of bleeding G-tube status Anxiety/panic disorder Major depression  PLAN/REC: Cont vent support - settings reviewed and/or adjusted Cont vent bundle Daily SBT if/when meets criteria  PSV <> ATC as tolerated Continue nebulized steroids and bronchodilators DVT px: SQ heparin Monitor CBC intermittently Transfuse per usual guidelines  Cont Duragesic, scheduled clonazepam, scheduled quetiapine, paroxetine Continue PRN alprazolam Reportedly, she has a bed available at a vent SNF in [place] but we are working on transportation  [employer], DO  11/15/2017 8:23 AM Patient ID: Michelle [name], female   DOB: 1960/07/20, 57 y.o.   MRN:  [id number]

## 2017-11-16 LAB — GLUCOSE, CAPILLARY
GLUCOSE-CAPILLARY: 110 mg/dL — AB (ref 65–99)
GLUCOSE-CAPILLARY: 90 mg/dL (ref 65–99)
GLUCOSE-CAPILLARY: 102 mg/dL — AB (ref 65–99)
Glucose-Capillary: 113 mg/dL — ABNORMAL HIGH (ref 65–99)
Glucose-Capillary: 134 mg/dL — ABNORMAL HIGH (ref 65–99)
Glucose-Capillary: 107 mg/dL — ABNORMAL HIGH (ref 65–99)

## 2017-11-16 MED ORDER — SODIUM CHLORIDE 0.9 % IV BOLUS
1000.0000 mL | Freq: Once | INTRAVENOUS | Status: AC
  Administered 2017-11-16: 1000 mL via INTRAVENOUS

## 2017-11-16 NOTE — Progress Notes (Signed)
Placed pt on High Flow on trach at 40%, respiratory rate 26/min, sats 92%, tolerating well at this time. Will continue to monitor.

## 2017-11-16 NOTE — Progress Notes (Signed)
PT Cancellation Note  Patient Details Name: Michelle [name] MRN: [id number] DOB: [birth year]   Cancelled Treatment:    Reason Eval/Treat Not Completed: Patient's level of consciousness Attempted to see pt this AM.  Multiple attempts at loudly calling her name and shoulder rubs to rouse her w/o success.  May try back later as time allows if pt is appropriate.   Kreg Shropshire, DPT 11/16/2017, 11:26 AM

## 2017-11-16 NOTE — Progress Notes (Signed)
Placed pt back on vent to rest, pt tolerated High Flow trach well, RN notified.

## 2017-11-16 NOTE — Progress Notes (Signed)
PT Cancellation Note  Patient Details Name: Michelle [name] MRN: [id number] DOB: November 21, 1960   Cancelled Treatment:    Reason Eval/Treat Not Completed: Patient's level of consciousness Attempted to see pt again this afternoon.  More aggressive attempt to wake her (loudly calling name, shoulder and sternal rub, hand squeeze, etc) and pt showed absolutely no indication of opening eyes or any acknowledgement of someone being present.  Kreg Shropshire, DPT  11/16/2017, 2:52 PM

## 2017-11-16 NOTE — Progress Notes (Signed)
No issues last 24 hours  Vitals:   11/16/17 0400 11/16/17 0500 11/16/17 0600 11/16/17 0800  BP: 137/80 (!) 145/80 (!) 146/84   Pulse: (!) 105 (!) 103 (!) 103   Resp: (!) 21 20 (!) 21   Temp: 98.7 F (37.1 C)   98.8 F (37.1 C)  TempSrc: Axillary   Axillary  SpO2: 92% 92% 92%   Weight:  132 lb 7.9 oz (60.1 kg)    Height:       NAD HEENT WNL No JVD, trach site clean No wheezes or other adventitious sounds Regular, no M NABS, soft, NT Extremities warm without edema Diffusely weak, no focal deficits   BMP Latest Ref Rng & Units 11/15/2017 11/10/2017 11/07/2017  Glucose 65 - 99 mg/dL 128(H) 160(H) 154(H)  BUN 6 - 20 mg/dL 25(H) 30(H) 26(H)  Creatinine 0.44 - 1.00 mg/dL <0.30(L) <0.30(L) <0.30(L)  Sodium 135 - 145 mmol/L 139 136 137  Potassium 3.5 - 5.1 mmol/L 4.1 4.1 3.5  Chloride 101 - 111 mmol/L 96(L) 98(L) 100(L)  CO2 22 - 32 mmol/L 36(H) 32 31  Calcium 8.9 - 10.3 mg/dL 9.1 8.8(L) 8.5(L)    CBC Latest Ref Rng & Units 11/15/2017 11/14/2017 11/13/2017  WBC 3.6 - 11.0 K/uL 6.9 6.7 7.8  Hemoglobin 12.0 - 16.0 g/dL 7.8(L) 7.5(L) 7.5(L)  Hematocrit 35.0 - 47.0 % 24.6(L) 23.6(L) 22.9(L)  Platelets 150 - 440 K/uL 379 416 420    CXR: No new film   IMP: Very severe COPD Prolonged mechanical ventilation Failure to wean Tracheostomy tube status ICU acquired anemia without evidence of bleeding G-tube status Anxiety/panic disorder Major depression  PLAN/REC: Cont vent support - settings reviewed and/or adjusted Cont vent bundle Daily SBT if/when meets criteria  PSV <> ATC as tolerated Continue nebulized steroids and bronchodilators DVT px: SQ heparin Monitor CBC intermittently Transfuse per usual guidelines  Cont Duragesic, scheduled clonazepam, scheduled quetiapine, paroxetine Continue PRN alprazolam Reportedly, she has a bed available at a vent SNF in [place] but we are working on transportation  [employer], DO  11/16/2017 8:57 AM Patient ID: Michelle [name], female    DOB: 24-Jun-1960, 57 y.o.   MRN: [id number] Patient ID: Michelle [name], female   DOB: 1961/04/17, 57 y.o.   MRN: [id number]

## 2017-11-16 NOTE — Progress Notes (Addendum)
[place] at [place] NAME: Michelle [name]    MR#:  [id number]  DATE OF BIRTH:  29-Oct-1960  SUBJECTIVE:  Patient seen and evaluated today Currently on tracheostomy with ventilator support Patient sleeping unable to give any history   REVIEW OF SYSTEMS:    ROS Unable to obtain secondary to critical illness  DRUG ALLERGIES:   Allergies  Allergen Reactions  . Carvedilol Other (See Comments)    Pt gets "sick" when on medication    VITALS:  Blood pressure 115/63, pulse (!) 105, temperature 98.8 F (37.1 C), temperature source Axillary, resp. rate 12, height 4\' 9"  (1.448 m), weight 60.1 kg (132 lb 7.9 oz), last menstrual period 03/06/2005, SpO2 92 %.  PHYSICAL EXAMINATION:   Physical Exam  GENERAL:  57 y.o.-year-old patient lying in the bed EYES: Pupils equal, round, reactive to light and accommodation. No scleral icterus. Extraocular muscles intact.  HEENT: Head atraumatic, normocephalic.  Oropharynx clear NECK:  Supple, no jugular venous distention. No thyroid enlargement, no tenderness.  Has tracheostomy with vent support FIO2 35 % Peep 5 Rate 18 LUNGS: Decreased breath sounds bilaterally, rales heard in right lung. No use of accessory muscles of respiration.  CARDIOVASCULAR: S1, S2 normal. No murmurs, rubs, or gallops.  ABDOMEN: Soft, nontender, nondistended. Bowel sounds present. No organomegaly or mass.  Peg tube present EXTREMITIES: No cyanosis, clubbing or edema b/l.    NEUROLOGIC: Not oriented to time, place and person PSYCHIATRIC: could not be assessed SKIN: Has decubitus ulcer  LABORATORY PANEL:   CBC Recent Labs  Lab 11/15/17 0435  WBC 6.9  HGB 7.8*  HCT 24.6*  PLT 379   ------------------------------------------------------------------------------------------------------------------ Chemistries  Recent Labs  Lab 11/15/17 0435  NA 139  K 4.1  CL 96*  CO2 36*  GLUCOSE 128*  BUN 25*  CREATININE <0.30*   CALCIUM 9.1  AST 17  ALT 17  ALKPHOS 80  BILITOT 0.4   ------------------------------------------------------------------------------------------------------------------  Cardiac Enzymes No results for input(s): TROPONINI in the last 168 hours. ------------------------------------------------------------------------------------------------------------------  RADIOLOGY:  Dg Chest Port 1 View  Result Date: 11/15/2017 CLINICAL DATA:  Respiratory failure EXAM: PORTABLE CHEST 1 VIEW COMPARISON:  11/06/2017 FINDINGS: Cardiac shadow is stable. Tracheostomy tube and right-sided PICC line are again noted and stable. The lungs are well aerated bilaterally. Minimal blunting of the costophrenic angles is again seen. No new focal infiltrate is seen. The previously noted interstitial changes are stable in appearance. IMPRESSION: Stable appearance of the chest when compare with the prior exam. Electronically Signed   By: Inez Catalina M.D.   On: 11/15/2017 06:39     ASSESSMENT AND PLAN:  57 year old female patient currently in ICU for respiratory failure  -Acute on chronic hypoxic respiratory failure Status post tracheostomy on vent support Currently still on FiO2 of 35% Continue vent bundle Appreciate intensivist follow-up  -Critical illness polyneuropathy  -Acute on chronic COPD exacerbation Continue nebulization treatments  -Anxiety disorder anxiolytic medications  -Stage II decubitus ulcer Wound care protocol as per nursing staff  -Social worker follow-up for LTAC placement   All the records are reviewed and case discussed with Care Management/Social Worker. Management plans discussed with the patient, family and they are in agreement.  CODE STATUS: Partial  DVT Prophylaxis: SCDs  TOTAL TIME TAKING CARE OF THIS PATIENT: 28 minutes.   POSSIBLE D/C IN 3 to 4 DAYS, DEPENDING ON CLINICAL CONDITION.  Saundra Shelling M.D on 11/16/2017 at 11:37 AM  Between 7am to 6pm -  Pager -  [phone]  After 6pm go to [email or web address] - password [email or web address] [place] [facility]  Office  [phone]  CC: Primary care physician; Center, [facility]  Note: This dictation was prepared with Dragon dictation along with smaller phrase technology. Any transcriptional errors that result from this process are unintentional.

## 2017-11-17 ENCOUNTER — Inpatient Hospital Stay: Payer: Medicaid Other

## 2017-11-17 LAB — CBC WITH DIFFERENTIAL/PLATELET
BASOS ABS: 0 10*3/uL (ref 0–0.1)
Basophils Relative: 0 %
EOS PCT: 0 %
Eosinophils Absolute: 0 10*3/uL (ref 0–0.7)
HEMATOCRIT: 25.1 % — AB (ref 35.0–47.0)
Hemoglobin: 7.9 g/dL — ABNORMAL LOW (ref 12.0–16.0)
LYMPHS ABS: 1.1 10*3/uL (ref 1.0–3.6)
LYMPHS PCT: 8 %
MCH: 27.6 pg (ref 26.0–34.0)
MCHC: 31.4 g/dL — ABNORMAL LOW (ref 32.0–36.0)
MCV: 87.8 fL (ref 80.0–100.0)
Monocytes Absolute: 1.5 10*3/uL — ABNORMAL HIGH (ref 0.2–0.9)
Monocytes Relative: 11 %
NEUTROS ABS: 11 10*3/uL — AB (ref 1.4–6.5)
Neutrophils Relative %: 81 %
PLATELETS: 352 10*3/uL (ref 150–440)
RBC: 2.85 MIL/uL — AB (ref 3.80–5.20)
RDW: 17.1 % — ABNORMAL HIGH (ref 11.5–14.5)
WBC: 13.7 10*3/uL — ABNORMAL HIGH (ref 3.6–11.0)

## 2017-11-17 LAB — COMPREHENSIVE METABOLIC PANEL
ALK PHOS: 63 U/L (ref 38–126)
ALT: 15 U/L (ref 14–54)
AST: 25 U/L (ref 15–41)
Albumin: 2.1 g/dL — ABNORMAL LOW (ref 3.5–5.0)
Anion gap: 7 (ref 5–15)
BILIRUBIN TOTAL: 0.4 mg/dL (ref 0.3–1.2)
BUN: 20 mg/dL (ref 6–20)
CALCIUM: 8.5 mg/dL — AB (ref 8.9–10.3)
CO2: 32 mmol/L (ref 22–32)
Chloride: 96 mmol/L — ABNORMAL LOW (ref 101–111)
Glucose, Bld: 149 mg/dL — ABNORMAL HIGH (ref 65–99)
Potassium: 3.6 mmol/L (ref 3.5–5.1)
Sodium: 135 mmol/L (ref 135–145)
TOTAL PROTEIN: 5.7 g/dL — AB (ref 6.5–8.1)

## 2017-11-17 LAB — GLUCOSE, CAPILLARY
GLUCOSE-CAPILLARY: 100 mg/dL — AB (ref 65–99)
GLUCOSE-CAPILLARY: 142 mg/dL — AB (ref 65–99)
Glucose-Capillary: 90 mg/dL (ref 65–99)
Glucose-Capillary: 145 mg/dL — ABNORMAL HIGH (ref 65–99)
Glucose-Capillary: 183 mg/dL — ABNORMAL HIGH (ref 65–99)

## 2017-11-17 MED ORDER — SODIUM CHLORIDE 0.9 % IV SOLN
1.0000 g | Freq: Three times a day (TID) | INTRAVENOUS | Status: DC
  Administered 2017-11-17 – 2017-11-19 (×6): 1 g via INTRAVENOUS
  Filled 2017-11-17 (×8): qty 1

## 2017-11-17 MED ORDER — NOREPINEPHRINE 4 MG/250ML-% IV SOLN
0.0000 ug/min | INTRAVENOUS | Status: DC
  Administered 2017-11-17: 2 ug/min via INTRAVENOUS
  Administered 2017-11-17: 6 ug/min via INTRAVENOUS
  Filled 2017-11-17: qty 250

## 2017-11-17 MED ORDER — VANCOMYCIN HCL IN DEXTROSE 1-5 GM/200ML-% IV SOLN
1000.0000 mg | Freq: Once | INTRAVENOUS | Status: AC
  Administered 2017-11-17: 1000 mg via INTRAVENOUS
  Filled 2017-11-17: qty 200

## 2017-11-17 MED ORDER — PRO-STAT SUGAR FREE PO LIQD
30.0000 mL | Freq: Two times a day (BID) | ORAL | Status: DC
  Administered 2017-11-17 – 2017-12-31 (×88): 30 mL

## 2017-11-17 MED ORDER — SODIUM CHLORIDE 0.9 % IV BOLUS
1000.0000 mL | Freq: Once | INTRAVENOUS | Status: AC
  Administered 2017-11-17: 1000 mL via INTRAVENOUS

## 2017-11-17 MED ORDER — JEVITY 1.5 CAL/FIBER PO LIQD
1000.0000 mL | ORAL | Status: DC
  Administered 2017-11-17 – 2017-12-05 (×20): 1000 mL

## 2017-11-17 MED ORDER — IBUPROFEN 100 MG/5ML PO SUSP
600.0000 mg | Freq: Once | ORAL | Status: AC
  Administered 2017-11-17: 600 mg
  Filled 2017-11-17: qty 30

## 2017-11-17 MED ORDER — VANCOMYCIN HCL IN DEXTROSE 750-5 MG/150ML-% IV SOLN
750.0000 mg | Freq: Two times a day (BID) | INTRAVENOUS | Status: DC
Start: 2017-11-17 — End: 2017-11-19
  Administered 2017-11-17 – 2017-11-19 (×4): 750 mg via INTRAVENOUS
  Filled 2017-11-17 (×5): qty 150

## 2017-11-17 NOTE — Progress Notes (Signed)
Nutrition Follow-up  DOCUMENTATION CODES:   Non-severe (moderate) malnutrition in context of chronic illness  INTERVENTION:  Initiate new goal TF regimen of Jevity 1.5 Cal at 35 mL/hr (840 mL goal daily volume) + Pro-Stat 30 mL BID per G-tube. Provides 1460 kcal, 84 grams of protein, 18.5 grams of fiber, 638 mL H2O daily.  With current free water flush of 100 mL Q8hrs patient will receive a total of 938 mL H2O per day including water in tube feeding. Patient will also receive approximately 180 mL fluid with Pro-Stat provision.  Continue liquid MVI daily per tube.  Will discontinue Juven BID at this time as stage II pressure ulcer is now fully granulated and her device related pressure injury has also healed. Will continue to monitor skin assessment.  NUTRITION DIAGNOSIS:   Moderate Malnutrition related to chronic illness(COPD, CHF, hepatitis C, cocaine abuse) as evidenced by mild fat depletion, moderate fat depletion, mild muscle depletion, moderate muscle depletion.  Ongoing - addressing with TF regimen.  GOAL:   Provide needs based on ASPEN/SCCM guidelines  Met with TF regimen.  MONITOR:   Vent status, Labs, Weight trends, TF tolerance, I & O's  REASON FOR ASSESSMENT:   Ventilator    ASSESSMENT:   57 year old female with PMHx of emphysema, COPD, HTN, hypercholesterolemia, hx pneumothorax s/p chest tube insertion, CAD s/p cardiac catheterization, cocaine abuse, tobacco abuse, asthma, CHF, anxiety, hepatitis C admitted with acute exacerbation of COPD, acute on chronic respiratory failure requiring intubation 4/29, PNA, cocaine intoxication, AKI due to renal hypoperfusion.   -Patient is s/p tracheostomy tube placement on 5/16. OGT was replaced with NGT. -Patient s/p placement of 20 Fr. Pull-through gastrostomy tube on 5/30 by IR. -Per [place] RN documentation on 6/12 the device related pressure injury at trach has now healed as no open wounds were found. -Family now wants to  take patient home instead of to a vent/SNF. Per chart they are aware 24/7 care will need to be provided.  Patient remains on mechanical ventilation through tracheostomy tube. Currently on PRVC mode with FiO2 40%. Patient more lethargic today. Her daughter was at bedside today. Patient remains edematous with deep pitting edema to bilateral upper and lower extremities.  Access: 20 Fr. G-tube placed on 5/30 by IR  MAP: 54-87 mmHg; improving with fluid bolus (up to 68 mmHg now) but patient may require pressors  TF: pt continues to tolerate Vital 1.5 at 35 mL/hr + Pro-Stat 30 mL once daily; per pump history she received 758 mL tube feeds in the past 24 hrs (90.2% goal daily volume)  Patient is currently intubated on ventilator support Ve: 7.5 L/min Temp (24hrs), Avg:100 F (37.8 C), Min:97.9 F (36.6 C), Max:101.8 F (38.8 C)  Propofol: N/A  Medications reviewed and include: clonazepam, famotidine, fentanyl patch, free water flush 100 mL Q8hrs, Novolog 0-9 units Q4hrs (received 2 units past 24 hrs), liquid MVI daily per tube, Seroquel, senna-docusate, valproic acid, NS 1 L bolus infusing currently.  Labs reviewed: CBG 90-142, Chloride 96, Creatinine <0.3.  I/O: 940 mL UOP yesterday (0.7 mL/kg/hr)  Weight trend: 59.1 kg on 6/16; patient still +13.4 kg from admission weight   Discussed with RN. Patient has been more lethargic, has been febrile, and has low BP requiring fluid bolus. Likely going back on pressors today.  Diet Order:   Diet Order    None      EDUCATION NEEDS:   No education needs have been identified at this time  Skin:  Skin  Assessment: Skin Integrity Issues: Skin Integrity Issues:: Stage II Stage II: sacrum (fully granulated) Incisions: N/A Other: device related pressure injury has healed now  Last BM:  11/14/2017 - smear type 6  Height:   Ht Readings from Last 1 Encounters:  10/01/17 _0  (1.448 m)    Weight:   Wt Readings from Last 1 Encounters:   11/17/17 130 lb 4.7 oz (59.1 kg)    Ideal Body Weight:  43.2 kg  BMI:  Body mass index is 28.19 kg/m.  Estimated Nutritional Needs:   Kcal:  1150-1370 (25-30 kcal/kg)  Protein:  70-93 grams (1.5-2 grams/kg)  Fluid:  1.4-1.6 L/day (30-35 mL/kg)  Willey Blade, MS, RD, LDN Office: [phone] Pager: [phone] After Hours/Weekend Pager: [phone]

## 2017-11-17 NOTE — Progress Notes (Signed)
Pharmacy Antibiotic Note  Michelle [name] is a 57 y.o. female admitted on 09/30/2017 with PNA.  Pharmacy has been consulted for vancomycin dosing.  Plan: Vancomycin 1000 mg IV x 1 followed in approximately 8 hours (stacked dosing) by vancomycin 750 mg IV Q12H, predicted trough 16 mcg/ml. Pharmacy will continue to follow and adjust as needed to maintain trough 15 to 20 mcg/ml.   Vd 41.4 L, ke 0.065 hr-1, T1/2 10.6 hr  Note - patient had been on vancomycin in May with subtherapeutic trough 10 mcg/ml on dose 500 mg IV Q12H. Last dose of vancomycin was over a week ago - will proceed with stacked dosing as above.  Height: 4\' 9"  (144.8 cm) Weight: 130 lb 4.7 oz (59.1 kg) IBW/kg (Calculated) : 38.6  Temp (24hrs), Avg:100 F (37.8 C), Min:97.9 F (36.6 C), Max:101.8 F (38.8 C)  Recent Labs  Lab 11/11/17 0542 11/13/17 0353 11/14/17 0356 11/15/17 0435 11/17/17 0850  WBC 8.1 7.8 6.7 6.9 13.7*  CREATININE  --   --   --  <0.30* <0.30*    CrCl cannot be calculated (This lab value cannot be used to calculate CrCl because it is not a number: <0.30).    Allergies  Allergen Reactions  . Carvedilol Other (See Comments)    Pt gets "sick" when on medication    Antimicrobials this admission:   Dose adjustments this admission:   Microbiology results:  BCx:   UCx:    Sputum:    MRSA PCR:   Thank you for allowing pharmacy to be a part of this patient's care.  Laural Benes, Pharm.D., BCPS Clinical Pharmacist 11/17/2017 11:53 AM

## 2017-11-17 NOTE — Progress Notes (Addendum)
[place] at [place] NAME: Michelle [name]    MR#:  [id number]  DATE OF BIRTH:  08-Mar-1961  SUBJECTIVE:  Patient seen and evaluated today Currently on tracheostomy with ventilator support Spiked fever Lethargic Had low blood pressure   REVIEW OF SYSTEMS:    ROS Unable to obtain secondary to critical illness  DRUG ALLERGIES:   Allergies  Allergen Reactions  . Carvedilol Other (See Comments)    Pt gets "sick" when on medication    VITALS:  Blood pressure (!) 77/47, pulse 85, temperature 99.2 F (37.3 C), temperature source Axillary, resp. rate 16, height 4\' 9"  (1.448 m), weight 59.1 kg (130 lb 4.7 oz), last menstrual period 03/06/2005, SpO2 94 %.  PHYSICAL EXAMINATION:   Physical Exam  GENERAL:  57 year old patient lying in the bed EYES: Pupils equal, round, reactive to light and accommodation. No scleral icterus. Extraocular muscles intact.  HEENT: Head atraumatic, normocephalic.  Oropharynx clear NECK:  Supple, no jugular venous distention. No thyroid enlargement, no tenderness.  Has tracheostomy with vent support FIO2 35 % Peep 5 Rate 18 LUNGS: Decreased breath sounds bilaterally, rales heard in right lung. No use of accessory muscles of respiration.  CARDIOVASCULAR: S1, S2 normal. No murmurs, rubs, or gallops.  ABDOMEN: Soft, nontender, nondistended. Bowel sounds present. No organomegaly or mass.  Peg tube present EXTREMITIES: No cyanosis, clubbing or edema b/l.    NEUROLOGIC: Not oriented to time, place and person PSYCHIATRIC: could not be assessed SKIN: Has decubitus ulcer  LABORATORY PANEL:   CBC Recent Labs  Lab 11/17/17 0850  WBC 13.7*  HGB 7.9*  HCT 25.1*  PLT 352   ------------------------------------------------------------------------------------------------------------------ Chemistries  Recent Labs  Lab 11/17/17 0850  NA 135  K 3.6  CL 96*  CO2 32  GLUCOSE 149*  BUN 20  CREATININE <0.30*   CALCIUM 8.5*  AST 25  ALT 15  ALKPHOS 63  BILITOT 0.4   ------------------------------------------------------------------------------------------------------------------  Cardiac Enzymes No results for input(s): TROPONINI in the last 168 hours. ------------------------------------------------------------------------------------------------------------------  RADIOLOGY:  Dg Chest Port 1 View  Result Date: 11/17/2017 CLINICAL DATA:  Patient poor historian at this time. Encounter for patient on mechanically assisted ventilation. EXAM: PORTABLE CHEST - 1 VIEW COMPARISON:  11/15/2017 FINDINGS: Tracheostomy device and right arm PICC stable in position. Worsening left lower lung airspace consolidation with probable adjacent effusion. Mild diffuse interstitial prominence, with some increase in bibasilar interstitial infiltrates or edema. Heart size normal.  Aortic Atherosclerosis (ICD10-170.0) No pneumothorax. Visualized bones unremarkable. IMPRESSION: 1. Worsening bibasilar infiltrates or edema, left greater than right. 2. Increasing left pleural effusion. 3.  Support hardware stable in position. Electronically Signed   By: Lucrezia Europe M.D.   On: 11/17/2017 10:23     ASSESSMENT AND PLAN:  57 year old female patient currently in ICU for respiratory failure  -Acute on chronic hypoxic respiratory failure Status post tracheostomy on vent support Currently still on FiO2 of 35% Continue vent bundle Appreciate intensivist follow-up  -Health Care Associated Pneumonia Start IV vancomycin and IV cefepime antibiotics F/u cultures  - Hypotension IV fluid bolus  -Critical illness polyneuropathy  -Acute on chronic COPD exacerbation Continue nebulization treatments  -Anxiety disorder anxiolytic medications  -Stage II decubitus ulcer Wound care protocol as per nursing staff   All the records are reviewed and case discussed with Care Management/Social Worker. Management plans discussed with  the patient, family and they are in agreement.  CODE STATUS: Partial  DVT Prophylaxis: SCDs  TOTAL  TIME TAKING CARE OF THIS PATIENT: 35 minutes.   POSSIBLE D/C IN 3 to 4 DAYS, DEPENDING ON CLINICAL CONDITION.  Saundra Shelling M.D on 11/17/2017 at 11:56 AM  Between 7am to 6pm - Pager - [phone]  After 6pm go to [email or web address] - password [email or web address] [place] [facility]  Office  [phone]  CC: Primary care physician; Center, [facility]  Note: This dictation was prepared with Dragon dictation along with smaller phrase technology. Any transcriptional errors that result from this process are unintentional.

## 2017-11-17 NOTE — Progress Notes (Signed)
PT Cancellation Note  Patient Details Name: Michelle [name] MRN: [id number] DOB: 14-Jun-1960   Cancelled Treatment:    Reason Eval/Treat Not Completed: Patient not medically ready Went to CCU and talked to nurse.  She states that "PT is not going to happen today."  Pt has not been appropriate for PT for many days in a row at this time.  Will complete PT orders at this time, will need new PT orders if/when pt becomes appropriate.  Kreg Shropshire, DPT 11/17/2017, 1:57 PM

## 2017-11-17 NOTE — Plan of Care (Signed)
Patient tolerating vent.  Low grade fever this shift. Thick secretions.   No acute distress noted.  Will continue to monitor.

## 2017-11-17 NOTE — Progress Notes (Signed)
Patient ID: Michelle [name], female   DOB: 1960/07/15, 57 y.o.   MRN: [id number] Pulmonary/critical care attending  Addendum Patient's chest x-ray shows left lower lobe pneumonia, decreasing blood pressure, will give fluid bolus, start on norepinephrine and add vancomycin and cefepime  [name], D.O.

## 2017-11-17 NOTE — Progress Notes (Signed)
Over the last 24 hours patient has had multiple episodes of temperature spikes. Somewhat more lethargic  Vitals:   11/17/17 0730 11/17/17 0742 11/17/17 0800 11/17/17 0829  BP: 118/68  101/64   Pulse: (!) 103  (!) 114   Resp: (!) 23  19   Temp:    (!) 101.8 F (38.8 C)  TempSrc:    Axillary  SpO2: (!) 87% 95% 93%   Weight:      Height:       NAD HEENT WNL No JVD, trach site clean No wheezes or other adventitious sounds Regular, no M NABS, soft, NT Extremities warm without edema Diffusely weak, no focal deficits   BMP Latest Ref Rng & Units 11/15/2017 11/10/2017 11/07/2017  Glucose 65 - 99 mg/dL 128(H) 160(H) 154(H)  BUN 6 - 20 mg/dL 25(H) 30(H) 26(H)  Creatinine 0.44 - 1.00 mg/dL <0.30(L) <0.30(L) <0.30(L)  Sodium 135 - 145 mmol/L 139 136 137  Potassium 3.5 - 5.1 mmol/L 4.1 4.1 3.5  Chloride 101 - 111 mmol/L 96(L) 98(L) 100(L)  CO2 22 - 32 mmol/L 36(H) 32 31  Calcium 8.9 - 10.3 mg/dL 9.1 8.8(L) 8.5(L)    CBC Latest Ref Rng & Units 11/15/2017 11/14/2017 11/13/2017  WBC 3.6 - 11.0 K/uL 6.9 6.7 7.8  Hemoglobin 12.0 - 16.0 g/dL 7.8(L) 7.5(L) 7.5(L)  Hematocrit 35.0 - 47.0 % 24.6(L) 23.6(L) 22.9(L)  Platelets 150 - 440 K/uL 379 416 420    CXR: No new film   IMP: Very severe COPD,Prolonged mechanical ventilation,Failure to wean, S/PTracheostomy tube. Patient with multiple febrile episodes, will obtain CBC, BMP, respiratory culture, chest x-ray  ICU acquired anemia without evidence of bleeding  Anxiety/panic disorder resume on Klonopin and Seroquel, a little more somnolent, will hold Klonopin dose  Major depression  [name], DO  11/17/2017 8:39 AM Patient ID: Michelle [name], female   DOB: 09-28-1960, 56 y.o.   MRN: [id number] Patient ID: Michelle [name], female   DOB: Oct 11, 1960, 57 y.o.   MRN: [id number] Patient ID: Michelle [name], female   DOB: January 06, 1961, 57 y.o.   MRN: [id number]

## 2017-11-18 DIAGNOSIS — A419 Sepsis, unspecified organism: Principal | ICD-10-CM

## 2017-11-18 DIAGNOSIS — R6520 Severe sepsis without septic shock: Secondary | ICD-10-CM

## 2017-11-18 DIAGNOSIS — J15212 Pneumonia due to Methicillin resistant Staphylococcus aureus: Secondary | ICD-10-CM

## 2017-11-18 LAB — BASIC METABOLIC PANEL
Anion gap: 7 (ref 5–15)
BUN: 22 mg/dL — AB (ref 6–20)
CHLORIDE: 101 mmol/L (ref 101–111)
CO2: 31 mmol/L (ref 22–32)
Calcium: 8.3 mg/dL — ABNORMAL LOW (ref 8.9–10.3)
Creatinine, Ser: <0.3 mg/dL — ABNORMAL LOW (ref 0.44–1.00)
Glucose, Bld: 120 mg/dL — ABNORMAL HIGH (ref 65–99)
POTASSIUM: 4 mmol/L (ref 3.5–5.1)
SODIUM: 139 mmol/L (ref 135–145)

## 2017-11-18 LAB — GLUCOSE, CAPILLARY
GLUCOSE-CAPILLARY: 103 mg/dL — AB (ref 65–99)
GLUCOSE-CAPILLARY: 105 mg/dL — AB (ref 65–99)
GLUCOSE-CAPILLARY: 118 mg/dL — AB (ref 65–99)
GLUCOSE-CAPILLARY: 149 mg/dL — AB (ref 65–99)
GLUCOSE-CAPILLARY: 84 mg/dL (ref 65–99)
Glucose-Capillary: 99 mg/dL (ref 65–99)
Glucose-Capillary: 119 mg/dL — ABNORMAL HIGH (ref 65–99)

## 2017-11-18 LAB — CBC
HCT: 25.6 % — ABNORMAL LOW (ref 35.0–47.0)
HEMOGLOBIN: 8.3 g/dL — AB (ref 12.0–16.0)
MCH: 28.6 pg (ref 26.0–34.0)
MCHC: 32.3 g/dL (ref 32.0–36.0)
MCV: 88.4 fL (ref 80.0–100.0)
Platelets: 311 10*3/uL (ref 150–440)
RBC: 2.9 MIL/uL — AB (ref 3.80–5.20)
RDW: 17.4 % — ABNORMAL HIGH (ref 11.5–14.5)
WBC: 16.5 10*3/uL — ABNORMAL HIGH (ref 3.6–11.0)

## 2017-11-18 MED ORDER — IBUPROFEN 100 MG/5ML PO SUSP
600.0000 mg | Freq: Once | ORAL | Status: AC
  Administered 2017-11-18: 600 mg
  Filled 2017-11-18: qty 30

## 2017-11-18 MED ORDER — NOREPINEPHRINE 16 MG/250ML-% IV SOLN
0.0000 ug/min | INTRAVENOUS | Status: DC
  Administered 2017-11-18: 10 ug/min via INTRAVENOUS
  Filled 2017-11-18: qty 250

## 2017-11-18 MED ORDER — QUETIAPINE FUMARATE 25 MG PO TABS
25.0000 mg | ORAL_TABLET | Freq: Every day | ORAL | Status: DC
  Administered 2017-11-19 – 2017-12-30 (×42): 25 mg
  Filled 2017-11-18 (×42): qty 1

## 2017-11-18 MED ORDER — FENTANYL 50 MCG/HR TD PT72
50.0000 ug | MEDICATED_PATCH | TRANSDERMAL | Status: DC
  Administered 2017-11-19 – 2017-12-10 (×8): 50 ug via TRANSDERMAL
  Filled 2017-11-18 (×8): qty 1

## 2017-11-18 MED ORDER — ENOXAPARIN SODIUM 40 MG/0.4ML ~~LOC~~ SOLN: 40.0000 mg | SUBCUTANEOUS | Status: DC

## 2017-11-18 MED ORDER — ENOXAPARIN SODIUM 40 MG/0.4ML ~~LOC~~ SOLN
40.0000 mg | SUBCUTANEOUS | Status: DC
  Administered 2017-11-19 – 2017-12-31 (×43): 40 mg via SUBCUTANEOUS
  Filled 2017-11-18 (×43): qty 0.4

## 2017-11-18 MED ORDER — SODIUM CHLORIDE 0.9 % IV BOLUS
1000.0000 mL | Freq: Once | INTRAVENOUS | Status: AC
  Administered 2017-11-18: 1000 mL via INTRAVENOUS

## 2017-11-18 MED ORDER — ACETAMINOPHEN 325 MG PO TABS
650.0000 mg | ORAL_TABLET | Freq: Four times a day (QID) | ORAL | Status: DC | PRN
  Administered 2017-11-18 – 2017-12-30 (×18): 650 mg
  Filled 2017-11-18 (×19): qty 2

## 2017-11-18 MED ORDER — IBUPROFEN 100 MG/5ML PO SUSP
600.0000 mg | Freq: Three times a day (TID) | ORAL | Status: DC | PRN
  Filled 2017-11-18: qty 30

## 2017-11-18 NOTE — Progress Notes (Signed)
Pharmacy Antibiotic Note  Michelle [name] is a 57 y.o. female admitted on 09/30/2017 with PNA.  Pharmacy has been consulted for vancomycin dosing.  Plan:  Continue vancomycin 750mg  q12h for a goal trough of 15 to 20 mcg/ml. Vanc trough ordered for 06/18 0930. Will continue to monitor and adjust as necessary.   Height: 4\' 9"  (144.8 cm) Weight: 143 lb 11.8 oz (65.2 kg) IBW/kg (Calculated) : 38.6  Temp (24hrs), Avg:100.4 F (38 C), Min:98.7 F (37.1 C), Max:102.8 F (39.3 C)  Recent Labs  Lab 11/13/17 0353 11/14/17 0356 11/15/17 0435 11/17/17 0850 11/18/17 0805  WBC 7.8 6.7 6.9 13.7* 16.5*  CREATININE  --   --  <0.30* <0.30* <0.30*    CrCl cannot be calculated (This lab value cannot be used to calculate CrCl because it is not a number: <0.30).    Allergies  Allergen Reactions  . Carvedilol Other (See Comments)    Pt gets "sick" when on medication    Antimicrobials this admission: 6/16 cefepime >> 6/16 vancomycin >>    Microbiology results: 6/16 BCx: no growth <24 hours 6/16 Sputum: abundant staph aureus, susceptibilities pending  Thank you for allowing pharmacy to be a part of this patient's care.   Tyson Babinski 11/18/2017 11:51 AM

## 2017-11-18 NOTE — Progress Notes (Signed)
[place] [facility] [place] NAME: Anniebelle Devore    MR#:  [id number]  DATE OF BIRTH:  07/27/1960  SUBJECTIVE:  Patient seen and evaluated today Currently on tracheostomy with ventilator support Has low grade fever Lethargic Had low blood pressure   REVIEW OF SYSTEMS:    ROS Unable to obtain secondary to critical illness  DRUG ALLERGIES:   Allergies  Allergen Reactions  . Carvedilol Other (See Comments)    Pt gets "sick" when on medication    VITALS:  Blood pressure (!) 76/51, pulse 93, temperature 100.2 F (37.9 C), temperature source Axillary, resp. rate 17, height 4\' 9"  (1.448 m), weight 65.2 kg (143 lb 11.8 oz), last menstrual period 03/06/2005, SpO2 96 %.  PHYSICAL EXAMINATION:   Physical Exam  GENERAL:  57 y.o.-year-old patient lying in the bed with tracheostomy on ventilator support EYES: Pupils equal, round, reactive to light and accommodation. No scleral icterus. Extraocular muscles intact.  HEENT: Head atraumatic, normocephalic.  Oropharynx clear NECK:  Supple, no jugular venous distention. No thyroid enlargement, no tenderness.  Has tracheostomy with vent support FIO2 35 % Peep 5 Rate 18 TV 450 ml LUNGS: Decreased breath sounds bilaterally, rales heard in both lungs. No use of accessory muscles of respiration.  CARDIOVASCULAR: S1, S2 normal. No murmurs, rubs, or gallops.  ABDOMEN: Soft, nontender, nondistended. Bowel sounds present. No organomegaly or mass.  Peg tube present EXTREMITIES: No cyanosis, clubbing or edema b/l.    NEUROLOGIC: Not oriented to time, place and person PSYCHIATRIC: could not be assessed SKIN: Has decubitus ulcer  LABORATORY PANEL:   CBC Recent Labs  Lab 11/18/17 0805  WBC 16.5*  HGB 8.3*  HCT 25.6*  PLT 311   ------------------------------------------------------------------------------------------------------------------ Chemistries  Recent Labs  Lab 11/17/17 0850 11/18/17 0805   NA 135 139  K 3.6 4.0  CL 96* 101  CO2 32 31  GLUCOSE 149* 120*  BUN 20 22*  CREATININE <0.30* <0.30*  CALCIUM 8.5* 8.3*  AST 25  --   ALT 15  --   ALKPHOS 63  --   BILITOT 0.4  --    ------------------------------------------------------------------------------------------------------------------  Cardiac Enzymes No results for input(s): TROPONINI in the last 168 hours. ------------------------------------------------------------------------------------------------------------------  RADIOLOGY:  Dg Chest Port 1 View  Result Date: 11/17/2017 CLINICAL DATA:  Patient poor historian at this time. Encounter for patient on mechanically assisted ventilation. EXAM: PORTABLE CHEST - 1 VIEW COMPARISON:  11/15/2017 FINDINGS: Tracheostomy device and right arm PICC stable in position. Worsening left lower lung airspace consolidation with probable adjacent effusion. Mild diffuse interstitial prominence, with some increase in bibasilar interstitial infiltrates or edema. Heart size normal.  Aortic Atherosclerosis (ICD10-170.0) No pneumothorax. Visualized bones unremarkable. IMPRESSION: 1. Worsening bibasilar infiltrates or edema, left greater than right. 2. Increasing left pleural effusion. 3.  Support hardware stable in position. Electronically Signed   By: Lucrezia Europe M.D.   On: 11/17/2017 10:23     ASSESSMENT AND PLAN:  57 year old female patient currently in ICU for respiratory failure, hypotension, pneumonia  -Acute on chronic hypoxic respiratory failure Status post tracheostomy on vent support Currently still on FiO2 of 35% Continue vent bundle  Intensivist follow-up  -Health Care Associated Pneumonia On IV vancomycin and IV cefepime antibiotics F/u cultures  - Hypotension On IV levophed drip for support of BP  -Critical illness polyneuropathy  -Acute on chronic COPD exacerbation Continue nebulization treatments  -Anxiety disorder anxiolytic medications  -Stage II decubitus  ulcer Wound care protocol as  per nursing staff   All the records are reviewed and case discussed with Care Management/Social Worker. Management plans discussed with the patient, family and they are in agreement.  CODE STATUS: Partial  DVT Prophylaxis: SCDs  TOTAL TIME TAKING CARE OF THIS PATIENT: 36 minutes.   POSSIBLE D/C IN 3 to 4 DAYS, DEPENDING ON CLINICAL CONDITION.  Saundra Shelling M.D on 11/18/2017 at 11:03 AM  Between 7am to 6pm - Pager - [phone]  After 6pm go to [email or web address] - password [email or web address] [facility]  Office  [phone]  CC: Primary care physician; Center, [facility]  Note: This dictation was prepared with Dragon dictation along with smaller phrase technology. Any transcriptional errors that result from this process are unintentional.

## 2017-11-18 NOTE — Care Management (Addendum)
Deb with [facility] private duty and Corene Cornea with [facility] DME updated. RNCM reached out to patient's daughter yasha with update. She and her sister plan to be at hospital today and would like reach out to [facility] when they arrive. RNCM will make herself available. Update at 358P: family/daughters have not contacted RNCM and I've checked several time throughout the day- they have not arrived. Home Vent applications delivered to RN for MD signature. Update 11/19/17 - Home vent medical necessity signed by MD and delivered to [facility] with [facility].

## 2017-11-18 NOTE — Progress Notes (Signed)
Somnolent.  On full ventilator support.  No distress.  Vitals:   11/18/17 1130 11/18/17 1145 11/18/17 1200 11/18/17 1215  BP: (!) 88/57 105/64 (!) 84/56 (!) 82/55  Pulse: 91 95 92 91  Resp: 17 (!) 22 18 16   Temp:  98.7 F (37.1 C)    TempSrc:  Axillary    SpO2: 96% 93% 94% 94%  Weight:      Height:       NAD, minimally responsive HEENT WNL No JVD, trach site clean Few scattered rhonchi Regular, no M NABS, soft, NT Pitting edema in all extremities Diffusely weak, no focal deficits   BMP Latest Ref Rng & Units 11/18/2017 11/17/2017 11/15/2017  Glucose 65 - 99 mg/dL 120(H) 149(H) 128(H)  BUN 6 - 20 mg/dL 22(H) 20 25(H)  Creatinine 0.44 - 1.00 mg/dL <0.30(L) <0.30(L) <0.30(L)  Sodium 135 - 145 mmol/L 139 135 139  Potassium 3.5 - 5.1 mmol/L 4.0 3.6 4.1  Chloride 101 - 111 mmol/L 101 96(L) 96(L)  CO2 22 - 32 mmol/L 31 32 36(H)  Calcium 8.9 - 10.3 mg/dL 8.3(L) 8.5(L) 9.1    CBC Latest Ref Rng & Units 11/18/2017 11/17/2017 11/15/2017  WBC 3.6 - 11.0 K/uL 16.5(H) 13.7(H) 6.9  Hemoglobin 12.0 - 16.0 g/dL 8.3(L) 7.9(L) 7.8(L)  Hematocrit 35.0 - 47.0 % 25.6(L) 25.1(L) 24.6(L)  Platelets 150 - 440 K/uL 311 352 379   Results for orders placed or performed during the hospital encounter of 09/30/17  CULTURE, BLOOD (ROUTINE X 2) w Reflex to ID Panel     Status: None   Collection Time: 09/30/17  5:43 PM  Result Value Ref Range Status   Specimen Description BLOOD CENTRAL LINE  Final   Special Requests   Final    BOTTLES DRAWN AEROBIC AND ANAEROBIC Blood Culture adequate volume   Culture   Final    NO GROWTH 5 DAYS Performed at [facility], [address] [place], [place] [address]    Report Status 10/05/2017 FINAL  Final  MRSA PCR Screening     Status: None   Collection Time: 09/30/17  8:02 PM  Result Value Ref Range Status   MRSA by PCR NEGATIVE NEGATIVE Final    Comment:        The GeneXpert MRSA Assay (FDA approved for NASAL specimens only), is one component of  a comprehensive MRSA colonization surveillance program. It is not intended to diagnose MRSA infection nor to guide or monitor treatment for MRSA infections. Performed at [facility], [address], [place], [place] [address]   Urine Culture     Status: None   Collection Time: 09/30/17  8:04 PM  Result Value Ref Range Status   Specimen Description   Final    URINE, RANDOM Performed at [facility], [address] [place], [place] [address]    Special Requests   Final    NONE Performed at [facility], [address] [place], [place] [address]    Culture   Final    NO GROWTH Performed at [facility], [place] [address] [place],  [address]    Report Status 10/02/2017 FINAL  Final  CULTURE, BLOOD (ROUTINE X 2) w Reflex to ID Panel     Status: None   Collection Time: 09/30/17  8:16 PM  Result Value Ref Range Status   Specimen Description BLOOD L HAND  Final   Special Requests   Final    BOTTLES DRAWN AEROBIC AND ANAEROBIC Blood  Culture results may not be optimal due to an inadequate volume of blood received in culture bottles   Culture   Final    NO GROWTH 5 DAYS Performed at [facility], [address] [place], [place] [address]    Report Status 10/05/2017 FINAL  Final  Culture, respiratory (NON-Expectorated)     Status: None   Collection Time: 09/30/17  9:07 PM  Result Value Ref Range Status   Specimen Description   Final    TRACHEAL ASPIRATE Performed at [facility] [address], [place], [place] [address]    Special Requests   Final    NONE Performed at [facility], [address] [place], [place] [address]    Gram Stain   Final    MODERATE WBC PRESENT, PREDOMINANTLY PMN FEW GRAM POSITIVE COCCI IN PAIRS IN CLUSTERS FEW GRAM NEGATIVE RODS    Culture   Final    FEW Consistent with normal respiratory flora. Performed at [facility], [place] [address] [place], [place] [address]     Report Status 10/03/2017 FINAL  Final  CULTURE, BLOOD (ROUTINE X 2) w Reflex to ID Panel     Status: None   Collection Time: 10/05/17 11:48 AM  Result Value Ref Range Status   Specimen Description BLOOD LAC  Final   Special Requests   Final    BOTTLES DRAWN AEROBIC AND ANAEROBIC Blood Culture adequate volume   Culture   Final    NO GROWTH 5 DAYS Performed at [facility], [address] [place], [place] [address]    Report Status 10/10/2017 FINAL  Final  CULTURE, BLOOD (ROUTINE X 2) w Reflex to ID Panel     Status: None   Collection Time: 10/05/17 11:55 AM  Result Value Ref Range Status   Specimen Description BLOOD LT HAND  Final   Special Requests   Final    BOTTLES DRAWN AEROBIC AND ANAEROBIC Blood Culture adequate volume   Culture   Final    NO GROWTH 5 DAYS Performed at [facility], [address] [place], [place] [address]    Report Status 10/10/2017 FINAL  Final  Culture, expectorated sputum-assessment     Status: None   Collection Time: 10/05/17  2:18 PM  Result Value Ref Range Status   Specimen Description TRACHEAL ASPIRATE  Final   Special Requests NONE  Final   Sputum evaluation   Final    THIS SPECIMEN IS ACCEPTABLE FOR SPUTUM CULTURE Performed at [facility], [address] [place], [place] [address]    Report Status 10/05/2017 FINAL  Final  Culture, respiratory (NON-Expectorated)     Status: None   Collection Time: 10/05/17  2:18 PM  Result Value Ref Range Status   Specimen Description   Final    TRACHEAL ASPIRATE Performed at [facility], [address] [place], [place] [address]    Special Requests   Final    NONE Reflexed from [id number] Performed at [facility], [address] [place], [place] [address]    Gram Stain   Final    MODERATE WBC PRESENT,BOTH PMN AND MONONUCLEAR NO SQUAMOUS EPITHELIAL CELLS SEEN FEW YEAST WITH PSEUDOHYPHAE RARE GRAM POSITIVE COCCI IN CLUSTERS Performed at [facility], [place] [address] [place],  [address]    Culture FEW CANDIDA ALBICANS FEW CANDIDA DUBLINIENSIS   Final   Report Status 10/08/2017 FINAL  Final  CULTURE, BLOOD (ROUTINE X 2) w Reflex  to ID Panel     Status: Abnormal   Collection Time: 10/18/17 10:51 AM  Result Value Ref Range Status   Specimen Description   Final    BLOOD LEFT ANTECUBITAL Performed at [facility], [address] [place], [place] [address]    Special Requests   Final    BOTTLES DRAWN AEROBIC AND ANAEROBIC Blood Culture adequate volume Performed at [facility], [address] [place], [place] [address]    Culture  Setup Time   Final    GRAM POSITIVE COCCI ANAEROBIC BOTTLE ONLY CRITICAL RESULT CALLED TO, READ BACK BY AND VERIFIED WITH: MELISSA MACCIA 10/19/17 @ 3810  [place]    Culture (A)  Final    STAPHYLOCOCCUS SPECIES (COAGULASE NEGATIVE) THE SIGNIFICANCE OF ISOLATING THIS ORGANISM FROM A SINGLE SET OF BLOOD CULTURES WHEN MULTIPLE SETS ARE DRAWN IS UNCERTAIN. PLEASE NOTIFY THE MICROBIOLOGY DEPARTMENT WITHIN ONE WEEK IF SPECIATION AND SENSITIVITIES ARE REQUIRED. Performed at [facility], [place] [address], [place], [place] [address]    Report Status 10/21/2017 FINAL  Final  Blood Culture ID Panel (Reflexed)     Status: Abnormal   Collection Time: 10/18/17 10:51 AM  Result Value Ref Range Status   Enterococcus species NOT DETECTED NOT DETECTED Final   Listeria monocytogenes NOT DETECTED NOT DETECTED Final   Staphylococcus species DETECTED (A) NOT DETECTED Final    Comment: Methicillin (oxacillin) resistant coagulase negative staphylococcus. Possible blood culture contaminant (unless isolated from more than one blood culture draw or clinical case suggests pathogenicity). No antibiotic treatment is indicated for blood  culture contaminants. CRITICAL RESULT CALLED TO, READ BACK BY AND VERIFIED WITH: MELISSA Sheldon 10/19/17 @ 2585  [place]    Staphylococcus aureus NOT DETECTED NOT  DETECTED Final   Methicillin resistance DETECTED (A) NOT DETECTED Final    Comment: CRITICAL RESULT CALLED TO, READ BACK BY AND VERIFIED WITH: MELISSA MACCIA 10/19/17 @ 2778  [place]    Streptococcus species NOT DETECTED NOT DETECTED Final   Streptococcus agalactiae NOT DETECTED NOT DETECTED Final   Streptococcus pneumoniae NOT DETECTED NOT DETECTED Final   Streptococcus pyogenes NOT DETECTED NOT DETECTED Final   Acinetobacter baumannii NOT DETECTED NOT DETECTED Final   Enterobacteriaceae species NOT DETECTED NOT DETECTED Final   Enterobacter cloacae complex NOT DETECTED NOT DETECTED Final   Escherichia coli NOT DETECTED NOT DETECTED Final   Klebsiella oxytoca NOT DETECTED NOT DETECTED Final   Klebsiella pneumoniae NOT DETECTED NOT DETECTED Final   Proteus species NOT DETECTED NOT DETECTED Final   Serratia marcescens NOT DETECTED NOT DETECTED Final   Haemophilus influenzae NOT DETECTED NOT DETECTED Final   Neisseria meningitidis NOT DETECTED NOT DETECTED Final   Pseudomonas aeruginosa NOT DETECTED NOT DETECTED Final   Candida albicans NOT DETECTED NOT DETECTED Final   Candida glabrata NOT DETECTED NOT DETECTED Final   Candida krusei NOT DETECTED NOT DETECTED Final   Candida parapsilosis NOT DETECTED NOT DETECTED Final   Candida tropicalis NOT DETECTED NOT DETECTED Final    Comment: Performed at [facility], [address] [place], [place] [address]  Culture, respiratory (NON-Expectorated)     Status: None   Collection Time: 10/18/17 11:09 AM  Result Value Ref Range Status   Specimen Description   Final    TRACHEAL ASPIRATE Performed at [facility], [address], [place],  [address]    Special Requests   Final    NONE Performed at [facility], [place],  [place] [address]    Gram Stain   Final    MODERATE WBC PRESENT, PREDOMINANTLY PMN NO ORGANISMS SEEN Performed at [facility], [place] [address] [place], [place]  [address]    Culture   Final    RARE CANDIDA DUBLINIENSIS RARE FUNGUS (MOLD) ISOLATED, PROBABLE CONTAMINANT/COLONIZER (SAPROPHYTE). CONTACT MICROBIOLOGY IF FURTHER IDENTIFICATION REQUIRED [phone].    Report Status 10/21/2017 FINAL  Final  CULTURE, BLOOD (ROUTINE X 2) w Reflex to ID Panel     Status: None   Collection Time: 10/18/17  4:09 PM  Result Value Ref Range Status   Specimen Description BLOOD L FA  Final   Special Requests   Final    BOTTLES DRAWN AEROBIC AND ANAEROBIC Blood Culture adequate volume   Culture   Final    NO GROWTH 5 DAYS Performed at [facility], [address] [place], [place] [address]    Report Status 10/23/2017 FINAL  Final  CULTURE, BLOOD (ROUTINE X 2) w Reflex to ID Panel     Status: None   Collection Time: 10/19/17  8:36 AM  Result Value Ref Range Status   Specimen Description BLOOD BLOOD LEFT HAND  Final   Special Requests   Final    BOTTLES DRAWN AEROBIC AND ANAEROBIC Blood Culture results may not be optimal due to an inadequate volume of blood received in culture bottles   Culture   Final    NO GROWTH 5 DAYS Performed at [facility], [address] [place], [place] [address]    Report Status 10/24/2017 FINAL  Final  CULTURE, BLOOD (ROUTINE X 2) w Reflex to ID Panel     Status: None   Collection Time: 10/19/17  8:52 AM  Result Value Ref Range Status   Specimen Description BLOOD A-LINE DRAW  Final   Special Requests   Final    BOTTLES DRAWN AEROBIC AND ANAEROBIC Blood Culture adequate volume   Culture   Final    NO GROWTH 5 DAYS Performed at [facility], [address] [place], [place] [address]    Report Status 10/24/2017 FINAL  Final  Culture, respiratory (NON-Expectorated)     Status: None   Collection Time: 10/23/17 11:36 PM  Result Value Ref Range Status   Specimen Description   Final    TRACHEAL ASPIRATE Performed at [facility], [address] [place] [address], [place], [place] [address]    Special  Requests   Final    NONE Performed at [facility], [address] [place], [place] [address]    Gram Stain   Final    RARE WBC PRESENT, PREDOMINANTLY PMN NO ORGANISMS SEEN Performed at [facility], [place] [address], [place], [place] [address]    Culture   Final    Consistent with normal respiratory flora. FUNGUS (MOLD) ISOLATED, PROBABLE CONTAMINANT/COLONIZER (SAPROPHYTE). CONTACT MICROBIOLOGY IF FURTHER IDENTIFICATION REQUIRED [phone].    Report Status 10/26/2017 FINAL  Final  Urine Culture     Status: Abnormal   Collection Time: 11/01/17  4:57 AM  Result Value Ref Range Status   Specimen Description   Final    URINE, CLEAN CATCH Performed at [facility], [address], [place], [place] [address]    Special Requests   Final    NONE Performed at [facility], [address] [place], [place] [address]    Culture (A)  Final    <10,000 COLONIES/mL INSIGNIFICANT GROWTH Performed at [place]  [address] [place], [place] [address]    Report Status 11/02/2017 FINAL  Final  CULTURE, BLOOD (ROUTINE X 2) w Reflex to ID Panel     Status: None   Collection Time: 11/01/17  1:28 PM  Result Value Ref Range Status   Specimen Description BLOOD LINE  Final   Special Requests   Final    BOTTLES DRAWN AEROBIC AND ANAEROBIC Blood Culture adequate volume   Culture   Final    NO GROWTH 5 DAYS Performed at [facility], [address] [place], [place] [address]    Report Status 11/06/2017 FINAL  Final  CULTURE, BLOOD (ROUTINE X 2) w Reflex to ID Panel     Status: None   Collection Time: 11/01/17  1:51 PM  Result Value Ref Range Status   Specimen Description BLOOD R HAND  Final   Special Requests   Final    BOTTLES DRAWN AEROBIC AND ANAEROBIC Blood Culture results may not be optimal due to an inadequate volume of blood received in culture bottles   Culture   Final    NO GROWTH 5 DAYS Performed at [facility],  [address] [place], [place] [address]    Report Status 11/06/2017 FINAL  Final  Culture, respiratory (NON-Expectorated)     Status: None   Collection Time: 11/04/17 12:20 PM  Result Value Ref Range Status   Specimen Description   Final    SPUTUM Performed at [facility], [address], [place], [place] [address]    Special Requests   Final    NONE Performed at [facility], [address] [place], [place] [address]    Gram Stain   Final    ABUNDANT WBC PRESENT,BOTH PMN AND MONONUCLEAR RARE GRAM POSITIVE COCCI Performed at [facility], [place] [address], [place], [place] [address]    Culture   Final    RARE FUNGUS (MOLD) ISOLATED, PROBABLE CONTAMINANT/COLONIZER (SAPROPHYTE). CONTACT MICROBIOLOGY IF FURTHER IDENTIFICATION REQUIRED [phone].   Report Status 11/06/2017 FINAL  Final  Culture, respiratory (NON-Expectorated)     Status: None (Preliminary result)   Collection Time: 11/17/17  7:57 AM  Result Value Ref Range Status   Specimen Description   Final    TRACHEAL ASPIRATE Performed at [facility], [address], [place], [place] [address]    Special Requests   Final    NONE Performed at [facility], [address] [place], [place] [address]    Gram Stain   Final    MODERATE WBC PRESENT,BOTH PMN AND MONONUCLEAR MODERATE GRAM POSITIVE COCCI IN PAIRS RARE GRAM NEGATIVE RODS    Culture   Final    ABUNDANT STAPHYLOCOCCUS AUREUS SUSCEPTIBILITIES TO FOLLOW Performed at [facility], [place] [address], [place], [place] [address]    Report Status PENDING  Incomplete  CULTURE, BLOOD (ROUTINE X 2) w Reflex to ID Panel     Status: None (Preliminary result)   Collection Time: 11/17/17  9:09 AM  Result Value Ref Range Status   Specimen Description BLOOD LEFT HAND  Final   Special Requests   Final    BOTTLES DRAWN AEROBIC AND ANAEROBIC Blood Culture adequate volume   Culture   Final    NO GROWTH < 24 HOURS Performed  at [facility], [address] [place], [place] [address]    Report Status PENDING  Incomplete  CULTURE, BLOOD (ROUTINE X 2) w Reflex to ID Panel     Status:  None (Preliminary result)   Collection Time: 11/17/17 10:05 AM  Result Value Ref Range Status   Specimen Description BLOOD L HAND  Final   Special Requests   Final    BOTTLES DRAWN AEROBIC AND ANAEROBIC Blood Culture adequate volume   Culture   Final    NO GROWTH < 24 HOURS Performed at [facility], [address] [place], [place] [address]    Report Status PENDING  Incomplete    Vanc 06/16 >>  Cefepime 06/16 >>   CXR: No new film   IMP: Very severe COPD Prolonged mechanical ventilation Failure to wean Tracheostomy tube status S aureus HCAP/VAP 06/16  Severe sepsis with borderline hypotension ICU acquired anemia G-tube status Anxiety/panic disorder Major depression  Severe protein calorie malnutrition Anasarca  PLAN/REC: Cont vent support - settings reviewed and/or adjusted Cont vent bundle Daily SBT if/when meets criteria  Cont to wean in PSV <> ATC as tolerated Continue nebulized steroids and bronchodilators Monitor temp, WBC count Micro and abx as above  NS bolus Norepinephrine as needed to maintain MAP >65 mmHg DVT px: SQ heparin Monitor CBC intermittently Transfuse per usual guidelines  Cont Duragesic, scheduled clonazepam, scheduled quetiapine, paroxetine Continue PRN alprazolam  Disposition: Family now hopes to take patient home  Merton Border, MD PCCM service Mobile [phone] Pager [phone] 11/18/2017 12:49 PM

## 2017-11-19 ENCOUNTER — Inpatient Hospital Stay: Payer: Medicaid Other

## 2017-11-19 DIAGNOSIS — Z978 Presence of other specified devices: Secondary | ICD-10-CM

## 2017-11-19 LAB — GLUCOSE, CAPILLARY
GLUCOSE-CAPILLARY: 102 mg/dL — AB (ref 65–99)
GLUCOSE-CAPILLARY: 103 mg/dL — AB (ref 65–99)
GLUCOSE-CAPILLARY: 115 mg/dL — AB (ref 65–99)
Glucose-Capillary: 92 mg/dL (ref 65–99)
Glucose-Capillary: 96 mg/dL (ref 65–99)
Glucose-Capillary: 94 mg/dL (ref 65–99)

## 2017-11-19 LAB — VANCOMYCIN, TROUGH: Vancomycin Tr: 11 ug/mL — ABNORMAL LOW (ref 15–20)

## 2017-11-19 LAB — CULTURE, RESPIRATORY

## 2017-11-19 LAB — CULTURE, RESPIRATORY W GRAM STAIN

## 2017-11-19 MED ORDER — VANCOMYCIN HCL IN DEXTROSE 1-5 GM/200ML-% IV SOLN
1000.0000 mg | Freq: Two times a day (BID) | INTRAVENOUS | Status: DC
  Filled 2017-11-19: qty 200

## 2017-11-19 MED ORDER — SODIUM CHLORIDE 0.9 % IV BOLUS
1000.0000 mL | Freq: Once | INTRAVENOUS | Status: AC
  Administered 2017-11-19: 1000 mL via INTRAVENOUS

## 2017-11-19 MED ORDER — CLONAZEPAM 1 MG PO TABS
1.0000 mg | ORAL_TABLET | Freq: Every day | ORAL | Status: DC
  Administered 2017-11-20 – 2017-12-30 (×41): 1 mg
  Filled 2017-11-19 (×41): qty 1

## 2017-11-19 MED ORDER — IBUPROFEN 100 MG/5ML PO SUSP
400.0000 mg | Freq: Three times a day (TID) | ORAL | Status: DC | PRN
  Administered 2017-11-19 – 2017-12-11 (×7): 400 mg
  Filled 2017-11-19 (×9): qty 20

## 2017-11-19 MED ORDER — NOREPINEPHRINE 4 MG/250ML-% IV SOLN
0.0000 ug/min | INTRAVENOUS | Status: DC
  Administered 2017-11-19: 2 ug/min via INTRAVENOUS
  Filled 2017-11-19: qty 250

## 2017-11-19 MED ORDER — CLONAZEPAM 0.5 MG PO TABS
0.5000 mg | ORAL_TABLET | Freq: Every day | ORAL | Status: DC
  Administered 2017-11-20 – 2017-12-14 (×22): 0.5 mg via ORAL
  Filled 2017-11-19 (×22): qty 1

## 2017-11-19 MED ORDER — CEFAZOLIN SODIUM-DEXTROSE 1-4 GM/50ML-% IV SOLN
1.0000 g | Freq: Three times a day (TID) | INTRAVENOUS | Status: DC
  Administered 2017-11-19 – 2017-11-28 (×27): 1 g via INTRAVENOUS
  Filled 2017-11-19 (×29): qty 50

## 2017-11-19 NOTE — Progress Notes (Signed)
Throughout night patient seems to be comfortable. Titrated off Levo, FiO2 titrated down to 30%. Sats maintained in mid to high 90s. Will continue to monitor.

## 2017-11-19 NOTE — Care Management (Signed)
Private Duty Nursing medical necessity received pending MD signature which will be faxed back to Ruffin Pyo with [facility] [phone].

## 2017-11-19 NOTE — Progress Notes (Signed)
Remains somnolent.  Remains on full ventilator support.  Minimally responsive.  No distress.  Vitals:   11/19/17 1300 11/19/17 1350 11/19/17 1400 11/19/17 1500  BP: 112/70  114/68 126/69  Pulse:      Resp: 16  (!) 21 19  Temp: 98.8 F (37.1 C)  99.3 F (37.4 C) 99.7 F (37.6 C)  TempSrc:      SpO2:  90%  90%  Weight:      Height:       NAD, minimally responsive HEENT: NCAT, sclerae white No JVD, trach site clean Chest clear anteriorly Regular, no murmur Abdomen soft, NT, NABS Pitting edema in all extremities Profoundly weak without focal deficits   BMP Latest Ref Rng & Units 11/18/2017 11/17/2017 11/15/2017  Glucose 65 - 99 mg/dL 120(H) 149(H) 128(H)  BUN 6 - 20 mg/dL 22(H) 20 25(H)  Creatinine 0.44 - 1.00 mg/dL <0.30(L) <0.30(L) <0.30(L)  Sodium 135 - 145 mmol/L 139 135 139  Potassium 3.5 - 5.1 mmol/L 4.0 3.6 4.1  Chloride 101 - 111 mmol/L 101 96(L) 96(L)  CO2 22 - 32 mmol/L 31 32 36(H)  Calcium 8.9 - 10.3 mg/dL 8.3(L) 8.5(L) 9.1    CBC Latest Ref Rng & Units 11/18/2017 11/17/2017 11/15/2017  WBC 3.6 - 11.0 K/uL 16.5(H) 13.7(H) 6.9  Hemoglobin 12.0 - 16.0 g/dL 8.3(L) 7.9(L) 7.8(L)  Hematocrit 35.0 - 47.0 % 25.6(L) 25.1(L) 24.6(L)  Platelets 150 - 440 K/uL 311 352 379   Results for orders placed or performed during the hospital encounter of 09/30/17  CULTURE, BLOOD (ROUTINE X 2) w Reflex to ID Panel     Status: None   Collection Time: 09/30/17  5:43 PM  Result Value Ref Range Status   Specimen Description BLOOD CENTRAL LINE  Final   Special Requests   Final    BOTTLES DRAWN AEROBIC AND ANAEROBIC Blood Culture adequate volume   Culture   Final    NO GROWTH 5 DAYS Performed at [facility], [address] [place], [place] [address]    Report Status 10/05/2017 FINAL  Final  MRSA PCR Screening     Status: None   Collection Time: 09/30/17  8:02 PM  Result Value Ref Range Status   MRSA by PCR NEGATIVE NEGATIVE Final    Comment:        The GeneXpert MRSA  Assay (FDA approved for NASAL specimens only), is one component of a comprehensive MRSA colonization surveillance program. It is not intended to diagnose MRSA infection nor to guide or monitor treatment for MRSA infections. Performed at [facility], [address], [place], [place] [address]   Urine Culture     Status: None   Collection Time: 09/30/17  8:04 PM  Result Value Ref Range Status   Specimen Description   Final    URINE, RANDOM Performed at [facility], [address], [place], [place] [address]    Special Requests   Final    NONE Performed at [facility], [address], [place], [place] [address]    Culture   Final    NO GROWTH Performed at [facility], [place] [address], [place],  [address]    Report Status 10/02/2017 FINAL  Final  CULTURE, BLOOD (ROUTINE X 2) w Reflex to ID Panel     Status: None   Collection Time: 09/30/17  8:16 PM  Result Value Ref Range Status   Specimen Description BLOOD L HAND  Final   Special Requests  Final    BOTTLES DRAWN AEROBIC AND ANAEROBIC Blood Culture results may not be optimal due to an inadequate volume of blood received in culture bottles   Culture   Final    NO GROWTH 5 DAYS Performed at [facility], [address] [place], [place] [address]    Report Status 10/05/2017 FINAL  Final  Culture, respiratory (NON-Expectorated)     Status: None   Collection Time: 09/30/17  9:07 PM  Result Value Ref Range Status   Specimen Description   Final    TRACHEAL ASPIRATE Performed at [facility], [address], [place], [place] [address]    Special Requests   Final    NONE Performed at [facility], [address] [place], [place] [address]    Gram Stain   Final    MODERATE WBC PRESENT, PREDOMINANTLY PMN FEW GRAM POSITIVE COCCI IN PAIRS IN CLUSTERS FEW GRAM NEGATIVE RODS    Culture   Final    FEW Consistent with normal respiratory flora. Performed  at [facility], [place] [address] [place], [place] [address]    Report Status 10/03/2017 FINAL  Final  CULTURE, BLOOD (ROUTINE X 2) w Reflex to ID Panel     Status: None   Collection Time: 10/05/17 11:48 AM  Result Value Ref Range Status   Specimen Description BLOOD LAC  Final   Special Requests   Final    BOTTLES DRAWN AEROBIC AND ANAEROBIC Blood Culture adequate volume   Culture   Final    NO GROWTH 5 DAYS Performed at [facility], [address] [place], [place] [address]    Report Status 10/10/2017 FINAL  Final  CULTURE, BLOOD (ROUTINE X 2) w Reflex to ID Panel     Status: None   Collection Time: 10/05/17 11:55 AM  Result Value Ref Range Status   Specimen Description BLOOD LT HAND  Final   Special Requests   Final    BOTTLES DRAWN AEROBIC AND ANAEROBIC Blood Culture adequate volume   Culture   Final    NO GROWTH 5 DAYS Performed at [facility], [address], [place], [place] [address]    Report Status 10/10/2017 FINAL  Final  Culture, expectorated sputum-assessment     Status: None   Collection Time: 10/05/17  2:18 PM  Result Value Ref Range Status   Specimen Description TRACHEAL ASPIRATE  Final   Special Requests NONE  Final   Sputum evaluation   Final    THIS SPECIMEN IS ACCEPTABLE FOR SPUTUM CULTURE Performed at [facility], [address] [place], [place] [address]    Report Status 10/05/2017 FINAL  Final  Culture, respiratory (NON-Expectorated)     Status: None   Collection Time: 10/05/17  2:18 PM  Result Value Ref Range Status   Specimen Description   Final    TRACHEAL ASPIRATE Performed at [facility], [address] [place], [place] [address]    Special Requests   Final    NONE Reflexed from [phone] Performed at [facility], [address] [place], [place] [address]    Gram Stain   Final    MODERATE WBC PRESENT,BOTH PMN AND MONONUCLEAR NO SQUAMOUS EPITHELIAL CELLS SEEN FEW YEAST WITH  PSEUDOHYPHAE RARE GRAM POSITIVE COCCI IN CLUSTERS Performed at [facility], [place] [address] [place], [place] [address]    Culture FEW CANDIDA ALBICANS FEW CANDIDA DUBLINIENSIS   Final   Report Status 10/08/2017 FINAL  Final  CULTURE, BLOOD (ROUTINE X 2) w Reflex to ID Panel     Status: Abnormal   Collection Time: 10/18/17 10:51 AM  Result Value Ref Range Status   Specimen Description   Final    BLOOD LEFT ANTECUBITAL Performed at [facility], [name], [place], [place] [address]    Special Requests   Final    BOTTLES DRAWN AEROBIC AND ANAEROBIC Blood Culture adequate volume Performed at [facility], [address] [place], [place] [address]    Culture  Setup Time   Final    GRAM POSITIVE COCCI ANAEROBIC BOTTLE ONLY CRITICAL RESULT CALLED TO, READ BACK BY AND VERIFIED WITH: MELISSA MACCIA 10/19/17 @ 6712  [place]    Culture (A)  Final    STAPHYLOCOCCUS SPECIES (COAGULASE NEGATIVE) THE SIGNIFICANCE OF ISOLATING THIS ORGANISM FROM A SINGLE SET OF BLOOD CULTURES WHEN MULTIPLE SETS ARE DRAWN IS UNCERTAIN. PLEASE NOTIFY THE MICROBIOLOGY DEPARTMENT WITHIN ONE WEEK IF SPECIATION AND SENSITIVITIES ARE REQUIRED. Performed at [facility], [place] [address] [place], [place] [address]    Report Status 10/21/2017 FINAL  Final  Blood Culture ID Panel (Reflexed)     Status: Abnormal   Collection Time: 10/18/17 10:51 AM  Result Value Ref Range Status   Enterococcus species NOT DETECTED NOT DETECTED Final   Listeria monocytogenes NOT DETECTED NOT DETECTED Final   Staphylococcus species DETECTED (A) NOT DETECTED Final    Comment: Methicillin (oxacillin) resistant coagulase negative staphylococcus. Possible blood culture contaminant (unless isolated from more than one blood culture draw or clinical case suggests pathogenicity). No antibiotic treatment is indicated for blood  culture contaminants. CRITICAL RESULT CALLED TO, READ BACK BY AND VERIFIED  WITH: MELISSA [place] 10/19/17 @ 9833  [place]    Staphylococcus aureus NOT DETECTED NOT DETECTED Final   Methicillin resistance DETECTED (A) NOT DETECTED Final    Comment: CRITICAL RESULT CALLED TO, READ BACK BY AND VERIFIED WITH: MELISSA MACCIA 10/19/17 @ 8250  [place]    Streptococcus species NOT DETECTED NOT DETECTED Final   Streptococcus agalactiae NOT DETECTED NOT DETECTED Final   Streptococcus pneumoniae NOT DETECTED NOT DETECTED Final   Streptococcus pyogenes NOT DETECTED NOT DETECTED Final   Acinetobacter baumannii NOT DETECTED NOT DETECTED Final   Enterobacteriaceae species NOT DETECTED NOT DETECTED Final   Enterobacter cloacae complex NOT DETECTED NOT DETECTED Final   Escherichia coli NOT DETECTED NOT DETECTED Final   Klebsiella oxytoca NOT DETECTED NOT DETECTED Final   Klebsiella pneumoniae NOT DETECTED NOT DETECTED Final   Proteus species NOT DETECTED NOT DETECTED Final   Serratia marcescens NOT DETECTED NOT DETECTED Final   Haemophilus influenzae NOT DETECTED NOT DETECTED Final   Neisseria meningitidis NOT DETECTED NOT DETECTED Final   Pseudomonas aeruginosa NOT DETECTED NOT DETECTED Final   Candida albicans NOT DETECTED NOT DETECTED Final   Candida glabrata NOT DETECTED NOT DETECTED Final   Candida krusei NOT DETECTED NOT DETECTED Final   Candida parapsilosis NOT DETECTED NOT DETECTED Final   Candida tropicalis NOT DETECTED NOT DETECTED Final    Comment: Performed at [facility], [address] [place], [place] [address]  Culture, respiratory (NON-Expectorated)     Status: None   Collection Time: 10/18/17 11:09 AM  Result Value Ref Range Status   Specimen Description   Final    TRACHEAL ASPIRATE Performed at [facility], [address], [place], [place] [address]    Special Requests   Final    NONE Performed  at [facility], [address] [place], [place] [address]    Gram Stain   Final    MODERATE WBC PRESENT, PREDOMINANTLY PMN NO  ORGANISMS SEEN Performed at [facility], [place] [address] [place], [place] [address]    Culture   Final    RARE CANDIDA DUBLINIENSIS RARE FUNGUS (MOLD) ISOLATED, PROBABLE CONTAMINANT/COLONIZER (SAPROPHYTE). CONTACT MICROBIOLOGY IF FURTHER IDENTIFICATION REQUIRED [phone].    Report Status 10/21/2017 FINAL  Final  CULTURE, BLOOD (ROUTINE X 2) w Reflex to ID Panel     Status: None   Collection Time: 10/18/17  4:09 PM  Result Value Ref Range Status   Specimen Description BLOOD L FA  Final   Special Requests   Final    BOTTLES DRAWN AEROBIC AND ANAEROBIC Blood Culture adequate volume   Culture   Final    NO GROWTH 5 DAYS Performed at [facility], [address] [place], [place] [address]    Report Status 10/23/2017 FINAL  Final  CULTURE, BLOOD (ROUTINE X 2) w Reflex to ID Panel     Status: None   Collection Time: 10/19/17  8:36 AM  Result Value Ref Range Status   Specimen Description BLOOD BLOOD LEFT HAND  Final   Special Requests   Final    BOTTLES DRAWN AEROBIC AND ANAEROBIC Blood Culture results may not be optimal due to an inadequate volume of blood received in culture bottles   Culture   Final    NO GROWTH 5 DAYS Performed at [facility], [address] [place], [place] [address]    Report Status 10/24/2017 FINAL  Final  CULTURE, BLOOD (ROUTINE X 2) w Reflex to ID Panel     Status: None   Collection Time: 10/19/17  8:52 AM  Result Value Ref Range Status   Specimen Description BLOOD A-LINE DRAW  Final   Special Requests   Final    BOTTLES DRAWN AEROBIC AND ANAEROBIC Blood Culture adequate volume   Culture   Final    NO GROWTH 5 DAYS Performed at [facility], [address], [place], [place] [address]    Report Status 10/24/2017 FINAL  Final  Culture, respiratory (NON-Expectorated)     Status: None   Collection Time: 10/23/17 11:36 PM  Result Value Ref Range Status   Specimen Description   Final    TRACHEAL ASPIRATE Performed  at [facility], [address], [place], [place] [address]    Special Requests   Final    NONE Performed at [facility], [address] [place], [place] [address]    Gram Stain   Final    RARE WBC PRESENT, PREDOMINANTLY PMN NO ORGANISMS SEEN Performed at [facility], [place] [address], [place], [place] [address]    Culture   Final    Consistent with normal respiratory flora. FUNGUS (MOLD) ISOLATED, PROBABLE CONTAMINANT/COLONIZER (SAPROPHYTE). CONTACT MICROBIOLOGY IF FURTHER IDENTIFICATION REQUIRED [phone].    Report Status 10/26/2017 FINAL  Final  Urine Culture     Status: Abnormal   Collection Time: 11/01/17  4:57 AM  Result Value Ref Range Status   Specimen Description   Final    URINE, CLEAN CATCH Performed at [facility], [address], [place], [place] [address]    Special Requests   Final    NONE Performed at [facility], [address], [place],  [address]    Culture (A)  Final    <10,000 COLONIES/mL INSIGNIFICANT  GROWTH Performed at [facility], [place] [address], [place], [place] [address]    Report Status 11/02/2017 FINAL  Final  CULTURE, BLOOD (ROUTINE X 2) w Reflex to ID Panel     Status: None   Collection Time: 11/01/17  1:28 PM  Result Value Ref Range Status   Specimen Description BLOOD LINE  Final   Special Requests   Final    BOTTLES DRAWN AEROBIC AND ANAEROBIC Blood Culture adequate volume   Culture   Final    NO GROWTH 5 DAYS Performed at [facility], [address] [place], [place] [address]    Report Status 11/06/2017 FINAL  Final  CULTURE, BLOOD (ROUTINE X 2) w Reflex to ID Panel     Status: None   Collection Time: 11/01/17  1:51 PM  Result Value Ref Range Status   Specimen Description BLOOD R HAND  Final   Special Requests   Final    BOTTLES DRAWN AEROBIC AND ANAEROBIC Blood Culture results may not be optimal due to an inadequate volume of blood received in culture  bottles   Culture   Final    NO GROWTH 5 DAYS Performed at [facility], [address] [place], [place] [address]    Report Status 11/06/2017 FINAL  Final  Culture, respiratory (NON-Expectorated)     Status: None   Collection Time: 11/04/17 12:20 PM  Result Value Ref Range Status   Specimen Description   Final    SPUTUM Performed at [facility], [address] [place], [place] [address]    Special Requests   Final    NONE Performed at [facility], [address] [place], [place] [address]    Gram Stain   Final    ABUNDANT WBC PRESENT,BOTH PMN AND MONONUCLEAR RARE GRAM POSITIVE COCCI Performed at [facility], [place] [address] [place], [place] [address]    Culture   Final    RARE FUNGUS (MOLD) ISOLATED, PROBABLE CONTAMINANT/COLONIZER (SAPROPHYTE). CONTACT MICROBIOLOGY IF FURTHER IDENTIFICATION REQUIRED [phone].   Report Status 11/06/2017 FINAL  Final  Culture, respiratory (NON-Expectorated)     Status: None   Collection Time: 11/17/17  7:57 AM  Result Value Ref Range Status   Specimen Description   Final    TRACHEAL ASPIRATE Performed at [facility], [address], [place],  [address]    Special Requests   Final    NONE Performed at [facility], [address] [place], [place] [address]    Gram Stain   Final    MODERATE WBC PRESENT,BOTH PMN AND MONONUCLEAR MODERATE GRAM POSITIVE COCCI IN PAIRS RARE GRAM NEGATIVE RODS Performed at [facility], [place] [address] [place],  [address]    Culture ABUNDANT STAPHYLOCOCCUS AUREUS  Final   Report Status 11/19/2017 FINAL  Final   Organism ID, Bacteria STAPHYLOCOCCUS AUREUS  Final      Susceptibility   Staphylococcus aureus - MIC*    CIPROFLOXACIN <=0.5 SENSITIVE Sensitive     ERYTHROMYCIN <=0.25 SENSITIVE Sensitive     GENTAMICIN <=0.5 SENSITIVE Sensitive     OXACILLIN <=0.25 SENSITIVE Sensitive     TETRACYCLINE <=1 SENSITIVE Sensitive      VANCOMYCIN 1 SENSITIVE Sensitive     TRIMETH/SULFA <=10 SENSITIVE Sensitive     CLINDAMYCIN <=0.25 SENSITIVE Sensitive     RIFAMPIN <=0.5 SENSITIVE Sensitive     Inducible Clindamycin NEGATIVE Sensitive     * ABUNDANT STAPHYLOCOCCUS AUREUS  CULTURE, BLOOD (  ROUTINE X 2) w Reflex to ID Panel     Status: None (Preliminary result)   Collection Time: 11/17/17  9:09 AM  Result Value Ref Range Status   Specimen Description BLOOD LEFT HAND  Final   Special Requests   Final    BOTTLES DRAWN AEROBIC AND ANAEROBIC Blood Culture adequate volume   Culture   Final    NO GROWTH 2 DAYS Performed at [facility], [address], [place], [place] [address]    Report Status PENDING  Incomplete  CULTURE, BLOOD (ROUTINE X 2) w Reflex to ID Panel     Status: None (Preliminary result)   Collection Time: 11/17/17 10:05 AM  Result Value Ref Range Status   Specimen Description BLOOD L HAND  Final   Special Requests   Final    BOTTLES DRAWN AEROBIC AND ANAEROBIC Blood Culture adequate volume   Culture  Setup Time PENDING  Incomplete   Culture   Final    NO GROWTH 2 DAYS Performed at [facility], [address] [place], [place] [address]    Report Status PENDING  Incomplete    Vanc 06/16 >> 06/18 Cefepime 06/16 >> 06/18 Cefazolin 06/18 >>   CXR: Bibasilar atelectasis and infiltrates   IMP: Very severe COPD Prolonged mechanical ventilation Failure to wean Tracheostomy tube status MSSA HCAP/VAP 06/16  Severe sepsis with hypotension, resolved ICU acquired anemia G-tube status Anxiety/panic disorder Major depression  Obtundation (suspect excessive sedation) Severe protein calorie malnutrition Anasarca  PLAN/REC: Cont vent support - settings reviewed and/or adjusted Cont vent bundle Daily SBT if/when meets criteria  Cont to wean in PSV <> ATC as tolerated Continue nebulized steroids and bronchodilators Monitor temp, WBC count Micro and abx as above  NS  bolus Norepinephrine as needed to maintain MAP >65 mmHg DVT px: SQ heparin Monitor CBC intermittently Transfuse per usual guidelines  Cont Duragesic patch at current dose  Decreased dose of scheduled clonazepam, quetiapine Continue paroxetine Continue PRN alprazolam  Plan family conference 6/19  Merton Border, MD PCCM service Mobile [phone] Pager [phone] 11/19/2017 4:01 PM

## 2017-11-19 NOTE — Progress Notes (Signed)
Spoke with daughter [name] and sister Freda Munro about meeting tomorrow monring around 9am with MD Alva Garnet and family agreed.

## 2017-11-19 NOTE — Progress Notes (Signed)
Pharmacy Antibiotic Note  Michelle [name] is a 57 y.o. female admitted on 09/30/2017 with staph PNA.  Pharmacy has been consulted for cefazolin dosing.  Plan: Per AM ICU rounds, will transition patient to cefazolin based on MSSA susceptibilities.  Initiate cefazolin 1 gram q8h.   Height: 4\' 9"  (144.8 cm) Weight: 139 lb 8.8 oz (63.3 kg) IBW/kg (Calculated) : 38.6  Temp (24hrs), Avg:100 F (37.8 C), Min:96.8 F (36 C), Max:101.5 F (38.6 C)  Recent Labs  Lab 11/13/17 0353 11/14/17 0356 11/15/17 0435 11/17/17 0850 11/18/17 0805 11/19/17 0911  WBC 7.8 6.7 6.9 13.7* 16.5*  --   CREATININE  --   --  <0.30* <0.30* <0.30*  --   VANCOTROUGH  --   --   --   --   --  11*    CrCl cannot be calculated (This lab value cannot be used to calculate CrCl because it is not a number: <0.30).    Allergies  Allergen Reactions  . Carvedilol Other (See Comments)    Pt gets "sick" when on medication    Antimicrobials this admission: 4/29 levofloxacin >> 4/30 4/30 Zosyn >> 5/7 5/4 vancomycin >> 5/6 5/8 erythromycin >> 5/20 5/18 vancomycin >> 5/20 5/30 cefazolin >> 5/30 5/31 ceftriaxone >> 6/2 6/4 cefepime >>  6/4 6/16 vancomycin >> 6/18 6/16 cefepime >> 6/18 6/18 cefazolin >>    Microbiology results: 4/29 MRSA PCR: negative 4/29 UCx: no growth final 4/29 RespCx/tracheal aspirate: few consistent with normal flora 4/29 BCx: no growth final 5/4 RespCx/tracheal aspirate: few candidaa albicans, few candida dubliniensis 5/4 BCx: no growth final 5/17 BCx: MSRA 5/17 RespCX/tracheal aspirate: rare candida dubliniensis 5/18 BCx: no growth final 5/31 UCx: insignificant growth 5/31 BCx: no growth final 6/3 Sputum: rare gram positive cocci 6/16 BCx: no growth x2 days 6/16 RespCx/tracheal aspirate: abundant staph aureus, (MSSA)    Thank you for allowing pharmacy to be a part of this patient's care.   Tyson Babinski 11/19/2017 10:04 AM

## 2017-11-19 NOTE — Progress Notes (Signed)
[facility] NAME: Abeera Flannery    MR#:  [id number]  DATE OF BIRTH:  Mar 16, 1961  SUBJECTIVE:  Patient seen and evaluated today Currently on tracheostomy with ventilator support More awake and alert Responds to verbal commands   REVIEW OF SYSTEMS:    ROS Unable to obtain secondary to critical illness  DRUG ALLERGIES:   Allergies  Allergen Reactions  . Carvedilol Other (See Comments)    Pt gets "sick" when on medication    VITALS:  Blood pressure 108/66, pulse 91, temperature 98.4 F (36.9 C), resp. rate 16, height 4\' 9"  (1.448 m), weight 63.3 kg (139 lb 8.8 oz), last menstrual period 03/06/2005, SpO2 95 %.  PHYSICAL EXAMINATION:   Physical Exam  GENERAL:  57 y.o.-year-old patient lying in the bed with tracheostomy on ventilator support EYES: Pupils equal, round, reactive to light and accommodation. No scleral icterus. Extraocular muscles intact.  HEENT: Head atraumatic, normocephalic.  Oropharynx clear NECK:  Supple, no jugular venous distention. No thyroid enlargement, no tenderness.  Has tracheostomy with vent support FIO2 30 % Peep 5 Rate 18 TV 450 ml LUNGS: Decreased breath sounds bilaterally, rales heard in both lungs. No use of accessory muscles of respiration.  CARDIOVASCULAR: S1, S2 normal. No murmurs, rubs, or gallops.  ABDOMEN: Soft, nontender, nondistended. Bowel sounds present. No organomegaly or mass.  Peg tube present EXTREMITIES: No cyanosis, clubbing or edema b/l.    NEUROLOGIC: Not oriented to time, place and person PSYCHIATRIC: could not be assessed SKIN: Has decubitus ulcer  LABORATORY PANEL:   CBC Recent Labs  Lab 11/18/17 0805  WBC 16.5*  HGB 8.3*  HCT 25.6*  PLT 311   ------------------------------------------------------------------------------------------------------------------ Chemistries  Recent Labs  Lab 11/17/17 0850 11/18/17 0805  NA 135 139  K 3.6 4.0  CL 96* 101   CO2 32 31  GLUCOSE 149* 120*  BUN 20 22*  CREATININE <0.30* <0.30*  CALCIUM 8.5* 8.3*  AST 25  --   ALT 15  --   ALKPHOS 63  --   BILITOT 0.4  --    ------------------------------------------------------------------------------------------------------------------  Cardiac Enzymes No results for input(s): TROPONINI in the last 168 hours. ------------------------------------------------------------------------------------------------------------------  RADIOLOGY:  Dg Chest Port 1 View  Result Date: 11/19/2017 CLINICAL DATA:  Respiratory failure. EXAM: PORTABLE CHEST 1 VIEW COMPARISON:  11/17/2017. FINDINGS: Tracheostomy tube noted in stable position. Right PICC line noted in stable position. Tiny catheter noted over the left upper chest, this may be outside the patient. Continued evaluation on follow-up exam suggested. Heart size stable. Continued progression of bibasilar atelectasis and infiltrates. Small bilateral pleural effusions. No pneumothorax. IMPRESSION: 1. Tracheostomy tube and right PICC line stable position. Tiny catheter is noted over the left upper chest, this may be outside the patient. Continued evaluation on follow-up exams suggested. 2. Continued progression of bibasilar atelectasis and infiltrates. Small bilateral pleural effusions. Electronically Signed   By: Marcello Moores  Register   On: 11/19/2017 06:04     ASSESSMENT AND PLAN:  57 year old female patient currently in ICU for respiratory failure, hypotension, pneumonia  -Acute on chronic hypoxic respiratory failure Status post tracheostomy on vent support Currently still on FiO2 of 30% Continue vent bundle  Intensivist follow-up  -Health Care Associated Pneumonia Tracheal aspirates : Staph Aureus On IV cefazolin abx based on sensitivites  - Hypotension improved Off IV pressor meds  -Critical illness polyneuropathy  -Acute on chronic COPD exacerbation Continue nebulization treatments  -Anxiety disorder  anxiolytic medications  -Stage  II decubitus ulcer Wound care protocol as per nursing staff   All the records are reviewed and case discussed with Care Management/Social Worker. Management plans discussed with the patient, family and they are in agreement.  CODE STATUS: Partial  DVT Prophylaxis: SCDs  TOTAL TIME TAKING CARE OF THIS PATIENT: 34 minutes.   POSSIBLE D/C IN 3 to 4 DAYS, DEPENDING ON CLINICAL CONDITION.  Saundra Shelling M.D on 11/19/2017 at 1:28 PM  Between 7am to 6pm - Pager - [phone]  After 6pm go to [email or web address] - password [email or web address] [facility]  Office  [phone]  CC: Primary care physician; Center, [facility]  Note: This dictation was prepared with Dragon dictation along with smaller phrase technology. Any transcriptional errors that result from this process are unintentional.

## 2017-11-20 LAB — GLUCOSE, CAPILLARY
Glucose-Capillary: 98 mg/dL (ref 65–99)
Glucose-Capillary: 89 mg/dL (ref 65–99)
Glucose-Capillary: 93 mg/dL (ref 65–99)

## 2017-11-20 LAB — CBC WITH DIFFERENTIAL/PLATELET
Band Neutrophils: 10 %
Basophils Absolute: 0 10*3/uL (ref 0–0.1)
Basophils Relative: 0 %
Blasts: 0 %
EOS PCT: 0 %
Eosinophils Absolute: 0 10*3/uL (ref 0–0.7)
HEMATOCRIT: 24.6 % — AB (ref 35.0–47.0)
Hemoglobin: 7.7 g/dL — ABNORMAL LOW (ref 12.0–16.0)
LYMPHS ABS: 1.1 10*3/uL (ref 1.0–3.6)
LYMPHS PCT: 11 %
MCH: 27.8 pg (ref 26.0–34.0)
MCHC: 31.4 g/dL — AB (ref 32.0–36.0)
MCV: 88.5 fL (ref 80.0–100.0)
MONOS PCT: 11 %
Metamyelocytes Relative: 2 %
Monocytes Absolute: 1.1 10*3/uL — ABNORMAL HIGH (ref 0.2–0.9)
Myelocytes: 2 %
NEUTROS ABS: 8.1 10*3/uL — AB (ref 1.4–6.5)
NEUTROS PCT: 64 %
NRBC: 1 /100{WBCs} — AB
OTHER: 0 %
Platelets: 280 10*3/uL (ref 150–440)
Promyelocytes Relative: 0 %
RBC: 2.78 MIL/uL — AB (ref 3.80–5.20)
RDW: 17.3 % — AB (ref 11.5–14.5)
WBC: 10.3 10*3/uL (ref 3.6–11.0)

## 2017-11-20 NOTE — Care Management (Addendum)
RNCM contacted [name] at [phone] to understand why family was not able to meet with Dr. Alva Garnet this morning. [name] said that they called the RN and notified that [name] was vomiting and could not come in. I confirmed with [name] that home vent assessment was arranged for Thursday and she said that she didn't know anything about it and it must have been discussed with her sister [name] as [name] lives with patient and their father. I reached out to [place] and she said the appointment with [facility] was changed to Monday 11/25/17 at noon. Corene Cornea with [facility] contacted office and they were not aware of this. [facility] will reach back out to [name] to arrange date/time.  I explained to both children that after the home assessment [facility] will bring their vent/patient's home vent to [facility] and patient will use it prior to returning home. I explained the importance of all caregivers being involved in training for patient. 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 [name] then said patient's home was being remodeled. I'm not certain what the true facts are of this situation however I will follow up with [facility] [phone] ext [phone] to confirm date/time after they speak again with [name]. I explained that when patient can tolerate home vent, Medicaid approves 16 hours for private duty nurse, and home is safe we will move forward with transition to home.  All agreed.

## 2017-11-20 NOTE — Progress Notes (Signed)
Responsive this morning.  Mouthing words.  Follows commands.  Remains on full ventilator support.  No distress.  Vitals:   11/20/17 1100 11/20/17 1106 11/20/17 1200 11/20/17 1216  BP: 114/66  117/76 122/73  Pulse:      Resp: (!) 22  16   Temp: (!) 100.8 F (38.2 C)  (!) 101.1 F (38.4 C) (!) 101.3 F (38.5 C)  TempSrc:      SpO2:  92%    Weight:      Height:       NAD, RASS 0 HEENT: NCAT, sclerae white No JVD noted Diminished breath sounds, no wheezes RRR, no M Abdomen soft, NT, NABS BUE, BLE pitting edema Diffuse profound weakness, no focal deficits   BMP Latest Ref Rng & Units 11/18/2017 11/17/2017 11/15/2017  Glucose 65 - 99 mg/dL 120(H) 149(H) 128(H)  BUN 6 - 20 mg/dL 22(H) 20 25(H)  Creatinine 0.44 - 1.00 mg/dL <0.30(L) <0.30(L) <0.30(L)  Sodium 135 - 145 mmol/L 139 135 139  Potassium 3.5 - 5.1 mmol/L 4.0 3.6 4.1  Chloride 101 - 111 mmol/L 101 96(L) 96(L)  CO2 22 - 32 mmol/L 31 32 36(H)  Calcium 8.9 - 10.3 mg/dL 8.3(L) 8.5(L) 9.1    CBC Latest Ref Rng & Units 11/20/2017 11/18/2017 11/17/2017  WBC 3.6 - 11.0 K/uL 10.3 16.5(H) 13.7(H)  Hemoglobin 12.0 - 16.0 g/dL 7.7(L) 8.3(L) 7.9(L)  Hematocrit 35.0 - 47.0 % 24.6(L) 25.6(L) 25.1(L)  Platelets 150 - 440 K/uL 280 311 352   Results for orders placed or performed during the hospital encounter of 09/30/17  CULTURE, BLOOD (ROUTINE X 2) w Reflex to ID Panel     Status: None   Collection Time: 09/30/17  5:43 PM  Result Value Ref Range Status   Specimen Description BLOOD CENTRAL LINE  Final   Special Requests   Final    BOTTLES DRAWN AEROBIC AND ANAEROBIC Blood Culture adequate volume   Culture   Final    NO GROWTH 5 DAYS Performed at [facility], [address] [place], [place] [address]    Report Status 10/05/2017 FINAL  Final  MRSA PCR Screening     Status: None   Collection Time: 09/30/17  8:02 PM  Result Value Ref Range Status   MRSA by PCR NEGATIVE NEGATIVE Final    Comment:        The GeneXpert MRSA  Assay (FDA approved for NASAL specimens only), is one component of a comprehensive MRSA colonization surveillance program. It is not intended to diagnose MRSA infection nor to guide or monitor treatment for MRSA infections. Performed at [facility], [address] [place], [place] [address]   Urine Culture     Status: None   Collection Time: 09/30/17  8:04 PM  Result Value Ref Range Status   Specimen Description   Final    URINE, RANDOM Performed at [facility], [address], [place], [place] [address]    Special Requests   Final    NONE Performed at [facility], [address], [place], [place] [address]    Culture   Final    NO GROWTH Performed at [facility], [place] [address] [place], [place] [address]    Report Status 10/02/2017 FINAL  Final  CULTURE, BLOOD (ROUTINE X 2) w Reflex to ID Panel     Status: None   Collection Time: 09/30/17  8:16 PM  Result Value Ref Range Status   Specimen Description BLOOD L HAND  Final   Special Requests   Final    BOTTLES DRAWN AEROBIC AND ANAEROBIC Blood Culture results may not be optimal due to an inadequate volume of blood received in culture bottles   Culture   Final    NO GROWTH 5 DAYS Performed at [facility], [address], [place], [place] [address]    Report Status 10/05/2017 FINAL  Final  Culture, respiratory (NON-Expectorated)     Status: None   Collection Time: 09/30/17  9:07 PM  Result Value Ref Range Status   Specimen Description   Final    TRACHEAL ASPIRATE Performed at [facility], [address] [place], [place] [address]    Special Requests   Final    NONE Performed at [facility], [address] [place], [place] [address]    Gram Stain   Final    MODERATE WBC PRESENT, PREDOMINANTLY PMN FEW GRAM POSITIVE COCCI IN PAIRS IN CLUSTERS FEW GRAM NEGATIVE RODS    Culture   Final    FEW Consistent with normal respiratory flora. Performed  at [facility], [place] [address] [place], [place] [address]    Report Status 10/03/2017 FINAL  Final  CULTURE, BLOOD (ROUTINE X 2) w Reflex to ID Panel     Status: None   Collection Time: 10/05/17 11:48 AM  Result Value Ref Range Status   Specimen Description BLOOD LAC  Final   Special Requests   Final    BOTTLES DRAWN AEROBIC AND ANAEROBIC Blood Culture adequate volume   Culture   Final    NO GROWTH 5 DAYS Performed at [facility], [address] [place], [place] [address]    Report Status 10/10/2017 FINAL  Final  CULTURE, BLOOD (ROUTINE X 2) w Reflex to ID Panel     Status: None   Collection Time: 10/05/17 11:55 AM  Result Value Ref Range Status   Specimen Description BLOOD LT HAND  Final   Special Requests   Final    BOTTLES DRAWN AEROBIC AND ANAEROBIC Blood Culture adequate volume   Culture   Final    NO GROWTH 5 DAYS Performed at [facility], [address], [place], [place] [address]    Report Status 10/10/2017 FINAL  Final  Culture, expectorated sputum-assessment     Status: None   Collection Time: 10/05/17  2:18 PM  Result Value Ref Range Status   Specimen Description TRACHEAL ASPIRATE  Final   Special Requests NONE  Final   Sputum evaluation   Final    THIS SPECIMEN IS ACCEPTABLE FOR SPUTUM CULTURE Performed at [facility], [address] [place], [place] [address]    Report Status 10/05/2017 FINAL  Final  Culture, respiratory (NON-Expectorated)     Status: None   Collection Time: 10/05/17  2:18 PM  Result Value Ref Range Status   Specimen Description   Final    TRACHEAL ASPIRATE Performed at [facility], [address] [place], [place] [address]    Special Requests   Final    NONE Reflexed from [id number] Performed at [facility], [address] [place], [place] [address]    Gram Stain   Final    MODERATE WBC PRESENT,BOTH PMN AND MONONUCLEAR NO SQUAMOUS EPITHELIAL CELLS SEEN FEW YEAST WITH  PSEUDOHYPHAE RARE GRAM POSITIVE COCCI IN CLUSTERS Performed at [facility], [place] [address] [place], [place] [address]    Culture FEW CANDIDA ALBICANS FEW CANDIDA DUBLINIENSIS  Final   Report Status 10/08/2017 FINAL  Final  CULTURE, BLOOD (ROUTINE X 2) w Reflex to ID Panel     Status: Abnormal   Collection Time: 10/18/17 10:51 AM  Result Value Ref Range Status   Specimen Description   Final    BLOOD LEFT ANTECUBITAL Performed at [facility], [address] [place], [place] [address]    Special Requests   Final    BOTTLES DRAWN AEROBIC AND ANAEROBIC Blood Culture adequate volume Performed at [facility], [address] [place], [place] [address]    Culture  Setup Time   Final    GRAM POSITIVE COCCI ANAEROBIC BOTTLE ONLY CRITICAL RESULT CALLED TO, READ BACK BY AND VERIFIED WITH: MELISSA MACCIA 10/19/17 @ 0714  MLK    Culture (A)  Final    STAPHYLOCOCCUS SPECIES (COAGULASE NEGATIVE) THE SIGNIFICANCE OF ISOLATING THIS ORGANISM FROM A SINGLE SET OF BLOOD CULTURES WHEN MULTIPLE SETS ARE DRAWN IS UNCERTAIN. PLEASE NOTIFY THE MICROBIOLOGY DEPARTMENT WITHIN ONE WEEK IF SPECIATION AND SENSITIVITIES ARE REQUIRED. Performed at [facility], [place] [address], [place], [place] [address]    Report Status 10/21/2017 FINAL  Final  Blood Culture ID Panel (Reflexed)     Status: Abnormal   Collection Time: 10/18/17 10:51 AM  Result Value Ref Range Status   Enterococcus species NOT DETECTED NOT DETECTED Final   Listeria monocytogenes NOT DETECTED NOT DETECTED Final   Staphylococcus species DETECTED (A) NOT DETECTED Final    Comment: Methicillin (oxacillin) resistant coagulase negative staphylococcus. Possible blood culture contaminant (unless isolated from more than one blood culture draw or clinical case suggests pathogenicity). No antibiotic treatment is indicated for blood  culture contaminants. CRITICAL RESULT CALLED TO, READ BACK BY AND VERIFIED  WITH: MELISSA Winthrop 10/19/17 @ 2130  [place]    Staphylococcus aureus NOT DETECTED NOT DETECTED Final   Methicillin resistance DETECTED (A) NOT DETECTED Final    Comment: CRITICAL RESULT CALLED TO, READ BACK BY AND VERIFIED WITH: MELISSA MACCIA 10/19/17 @ 8657  [place]    Streptococcus species NOT DETECTED NOT DETECTED Final   Streptococcus agalactiae NOT DETECTED NOT DETECTED Final   Streptococcus pneumoniae NOT DETECTED NOT DETECTED Final   Streptococcus pyogenes NOT DETECTED NOT DETECTED Final   Acinetobacter baumannii NOT DETECTED NOT DETECTED Final   Enterobacteriaceae species NOT DETECTED NOT DETECTED Final   Enterobacter cloacae complex NOT DETECTED NOT DETECTED Final   Escherichia coli NOT DETECTED NOT DETECTED Final   Klebsiella oxytoca NOT DETECTED NOT DETECTED Final   Klebsiella pneumoniae NOT DETECTED NOT DETECTED Final   Proteus species NOT DETECTED NOT DETECTED Final   Serratia marcescens NOT DETECTED NOT DETECTED Final   Haemophilus influenzae NOT DETECTED NOT DETECTED Final   Neisseria meningitidis NOT DETECTED NOT DETECTED Final   Pseudomonas aeruginosa NOT DETECTED NOT DETECTED Final   Candida albicans NOT DETECTED NOT DETECTED Final   Candida glabrata NOT DETECTED NOT DETECTED Final   Candida krusei NOT DETECTED NOT DETECTED Final   Candida parapsilosis NOT DETECTED NOT DETECTED Final   Candida tropicalis NOT DETECTED NOT DETECTED Final    Comment: Performed at [facility], [address] [place], [place] [address]  Culture, respiratory (NON-Expectorated)     Status: None   Collection Time: 10/18/17 11:09 AM  Result Value Ref Range Status   Specimen Description   Final    TRACHEAL ASPIRATE Performed at [facility], [address], [place], [place] [address]    Special Requests  Final    NONE Performed at [facility], [address] [place], [place] [address]    Gram Stain   Final    MODERATE WBC PRESENT, PREDOMINANTLY PMN NO  ORGANISMS SEEN Performed at [facility], [place] [address] [place], [place] [address]    Culture   Final    RARE CANDIDA DUBLINIENSIS RARE FUNGUS (MOLD) ISOLATED, PROBABLE CONTAMINANT/COLONIZER (SAPROPHYTE). CONTACT MICROBIOLOGY IF FURTHER IDENTIFICATION REQUIRED [phone].    Report Status 10/21/2017 FINAL  Final  CULTURE, BLOOD (ROUTINE X 2) w Reflex to ID Panel     Status: None   Collection Time: 10/18/17  4:09 PM  Result Value Ref Range Status   Specimen Description BLOOD L FA  Final   Special Requests   Final    BOTTLES DRAWN AEROBIC AND ANAEROBIC Blood Culture adequate volume   Culture   Final    NO GROWTH 5 DAYS Performed at [facility], [address] [place], [place] [address]    Report Status 10/23/2017 FINAL  Final  CULTURE, BLOOD (ROUTINE X 2) w Reflex to ID Panel     Status: None   Collection Time: 10/19/17  8:36 AM  Result Value Ref Range Status   Specimen Description BLOOD BLOOD LEFT HAND  Final   Special Requests   Final    BOTTLES DRAWN AEROBIC AND ANAEROBIC Blood Culture results may not be optimal due to an inadequate volume of blood received in culture bottles   Culture   Final    NO GROWTH 5 DAYS Performed at [facility], [address] [place], [place] [address]    Report Status 10/24/2017 FINAL  Final  CULTURE, BLOOD (ROUTINE X 2) w Reflex to ID Panel     Status: None   Collection Time: 10/19/17  8:52 AM  Result Value Ref Range Status   Specimen Description BLOOD A-LINE DRAW  Final   Special Requests   Final    BOTTLES DRAWN AEROBIC AND ANAEROBIC Blood Culture adequate volume   Culture   Final    NO GROWTH 5 DAYS Performed at [facility], [address], [place], [place] [address]    Report Status 10/24/2017 FINAL  Final  Culture, respiratory (NON-Expectorated)     Status: None   Collection Time: 10/23/17 11:36 PM  Result Value Ref Range Status   Specimen Description   Final    TRACHEAL ASPIRATE Performed  at [facility], [address], [place], [place] [address]    Special Requests   Final    NONE Performed at [facility], [address] [place], [place] [address]    Gram Stain   Final    RARE WBC PRESENT, PREDOMINANTLY PMN NO ORGANISMS SEEN Performed at [facility], [place] [address], [place], [place] [address]    Culture   Final    Consistent with normal respiratory flora. FUNGUS (MOLD) ISOLATED, PROBABLE CONTAMINANT/COLONIZER (SAPROPHYTE). CONTACT MICROBIOLOGY IF FURTHER IDENTIFICATION REQUIRED [phone].    Report Status 10/26/2017 FINAL  Final  Urine Culture     Status: Abnormal   Collection Time: 11/01/17  4:57 AM  Result Value Ref Range Status   Specimen Description   Final    URINE, CLEAN CATCH Performed at [facility], [address] [place], [place] [address]    Special Requests   Final    NONE Performed at [facility], [address] [place], [place] [address]    Culture (A)  Final    <  10,000 COLONIES/mL INSIGNIFICANT GROWTH Performed at [facility], [place] [address] [place], [place] [address]    Report Status 11/02/2017 FINAL  Final  CULTURE, BLOOD (ROUTINE X 2) w Reflex to ID Panel     Status: None   Collection Time: 11/01/17  1:28 PM  Result Value Ref Range Status   Specimen Description BLOOD LINE  Final   Special Requests   Final    BOTTLES DRAWN AEROBIC AND ANAEROBIC Blood Culture adequate volume   Culture   Final    NO GROWTH 5 DAYS Performed at [facility], [address] [place], [place] [address]    Report Status 11/06/2017 FINAL  Final  CULTURE, BLOOD (ROUTINE X 2) w Reflex to ID Panel     Status: None   Collection Time: 11/01/17  1:51 PM  Result Value Ref Range Status   Specimen Description BLOOD R HAND  Final   Special Requests   Final    BOTTLES DRAWN AEROBIC AND ANAEROBIC Blood Culture results may not be optimal due to an inadequate volume of blood received in culture  bottles   Culture   Final    NO GROWTH 5 DAYS Performed at [facility], [address] [place], [place] [address]    Report Status 11/06/2017 FINAL  Final  Culture, respiratory (NON-Expectorated)     Status: None   Collection Time: 11/04/17 12:20 PM  Result Value Ref Range Status   Specimen Description   Final    SPUTUM Performed at [name], [address], [place], [place] [address]    Special Requests   Final    NONE Performed at [facility], [address], [place], [place] [address]    Gram Stain   Final    ABUNDANT WBC PRESENT,BOTH PMN AND MONONUCLEAR RARE GRAM POSITIVE COCCI Performed at [facility], [place] [address], [place], [place] [address]    Culture   Final    RARE FUNGUS (MOLD) ISOLATED, PROBABLE CONTAMINANT/COLONIZER (SAPROPHYTE). CONTACT MICROBIOLOGY IF FURTHER IDENTIFICATION REQUIRED [phone].   Report Status 11/06/2017 FINAL  Final  Culture, respiratory (NON-Expectorated)     Status: None   Collection Time: 11/17/17  7:57 AM  Result Value Ref Range Status   Specimen Description   Final    TRACHEAL ASPIRATE Performed at [facility], [address] [place], [place] [address]    Special Requests   Final    NONE Performed at [facility], [address] [place], [place] [address]    Gram Stain   Final    MODERATE WBC PRESENT,BOTH PMN AND MONONUCLEAR MODERATE GRAM POSITIVE COCCI IN PAIRS RARE GRAM NEGATIVE RODS Performed at [facility], [place] [address], [place], [place] [address]    Culture ABUNDANT STAPHYLOCOCCUS AUREUS  Final   Report Status 11/19/2017 FINAL  Final   Organism ID, Bacteria STAPHYLOCOCCUS AUREUS  Final      Susceptibility   Staphylococcus aureus - MIC*    CIPROFLOXACIN <=0.5 SENSITIVE Sensitive     ERYTHROMYCIN <=0.25 SENSITIVE Sensitive     GENTAMICIN <=0.5 SENSITIVE Sensitive     OXACILLIN <=0.25 SENSITIVE Sensitive     TETRACYCLINE <=1 SENSITIVE Sensitive      VANCOMYCIN 1 SENSITIVE Sensitive     TRIMETH/SULFA <=10 SENSITIVE Sensitive     CLINDAMYCIN <=0.25 SENSITIVE Sensitive     RIFAMPIN <=0.5 SENSITIVE Sensitive     Inducible Clindamycin NEGATIVE Sensitive     * ABUNDANT STAPHYLOCOCCUS AUREUS  CULTURE, BLOOD (ROUTINE X 2) w Reflex to ID Panel     Status: None (Preliminary result)   Collection Time: 11/17/17  9:09 AM  Result Value Ref Range Status   Specimen Description BLOOD LEFT HAND  Final   Special Requests   Final    BOTTLES DRAWN AEROBIC AND ANAEROBIC Blood Culture adequate volume   Culture   Final    NO GROWTH 3 DAYS Performed at [facility], [address], [place], [place] [address]    Report Status PENDING  Incomplete  CULTURE, BLOOD (ROUTINE X 2) w Reflex to ID Panel     Status: Abnormal (Preliminary result)   Collection Time: 11/17/17 10:05 AM  Result Value Ref Range Status   Specimen Description   Final    BLOOD L HAND Performed at [facility], [address] [place], [place] [address]    Special Requests   Final    BOTTLES DRAWN AEROBIC AND ANAEROBIC Blood Culture adequate volume Performed at [facility], [address] [place], [place] [address]    Culture (A)  Final    MICROCOCCUS SPECIES Standardized susceptibility testing for this organism is not available. CRITICAL RESULT CALLED TO, READ BACK BY AND VERIFIED WITH: H. ZOMPA PHARMD, AT 1056 11/20/17 BY D. VANHOOK REGARDING CULTURE GROWTH Performed at [facility], [place] [address] [place], [place] [address]    Report Status PENDING  Incomplete    Vanc 06/16 >> 06/18 Cefepime 06/16 >> 06/18 Cefazolin 06/18 >>   CXR: No new film   IMP: Very severe COPD Prolonged mechanical ventilation Failure to wean Tracheostomy tube status MSSA HCAP/VAP 06/16  Severe sepsis with hypotension, resolved ICU acquired anemia G-tube status Anxiety/panic disorder Major depression  Obtundation (suspect excessive sedation) Severe  protein calorie malnutrition Anasarca  PLAN/REC: Cont vent support - settings reviewed and/or adjusted Cont to wean in PSV <> ATC as tolerated Cont vent bundle Daily SBT if/when meets criteria  Continue nebulized steroids and bronchodilators Monitor temp, WBC count Micro and abx as above  DVT px: SQ heparin Monitor CBC intermittently Transfuse per usual guidelines  Cont scheduled clonazepam, quetiapine - dose reduced 06/18 Continue paroxetine per psychiatry Continue PRN alprazolam  Family did not show for conference today. I attempted to contact daughter, [name] [phone]) without success  Merton Border, MD PCCM service Mobile [phone] Pager [phone] 11/20/2017 2:06 PM

## 2017-11-20 NOTE — Consult Note (Signed)
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 Measurement: N/A Wound bed:N/A Drainage (amount, consistency, odor) N/A Periwound: intact Dressing procedure/placement/frequency:Turning and repositioning is in place, heels are floated, external female urinary incontinence collection device (PurWick) is in place, sacral dressing in place.   [place] nursing team will not follow, but will remain available to this patient, the nursing and medical teams.  Please re-consult if needed. Thanks, Maudie Flakes, MSN, RN, [place], Ar[facility] [place]  Pager# [phone]

## 2017-11-20 NOTE — Progress Notes (Signed)
[place] [facility] [place] NAME: Enedina Pair    MR#:  [id number]  DATE OF BIRTH:  [birth year]  SUBJECTIVE:  Currently on tracheostomy with ventilator support More awake and alert, sedated this  morning responds to verbal commands   REVIEW OF SYSTEMS:    ROS Unable to obtain secondary to critical illness  DRUG ALLERGIES:   Allergies  Allergen Reactions  . Carvedilol Other (See Comments)    Pt gets "sick" when on medication    VITALS:  Blood pressure 122/73, pulse 96, temperature (!) 101.3 F (38.5 C), resp. rate 16, height 4\' 9"  (1.448 m), weight 63.4 kg (139 lb 12.4 oz), last menstrual period 03/06/2005, SpO2 92 %.  PHYSICAL EXAMINATION:   Physical Exam  GENERAL:  57 y.o.-year-old patient lying in the bed with tracheostomy on ventilator support EYES: Pupils equal, round, reactive to light and accommodation. No scleral icterus. Extraocular muscles intact.  HEENT: Head atraumatic, normocephalic.  Oropharynx clear NECK:  Supple, no jugular venous distention. No thyroid enlargement, no tenderness.  Has tracheostomy with vent support FIO2 30 % Peep 5 Rate 18 TV 450 ml LUNGS: Decreased breath sounds bilaterally, rales heard in both lungs. No use of accessory muscles of respiration.  CARDIOVASCULAR: S1, S2 normal. No murmurs, rubs, or gallops.  ABDOMEN: Soft, nontender, nondistended. Bowel sounds present. No organomegaly or mass.  Peg tube present EXTREMITIES: No cyanosis, clubbing or edema b/l.    NEUROLOGIC: Not oriented to time, place and person PSYCHIATRIC: could not be assessed SKIN: Has decubitus ulcer  LABORATORY PANEL:   CBC Recent Labs  Lab 11/20/17 0310  WBC 10.3  HGB 7.7*  HCT 24.6*  PLT 280   ------------------------------------------------------------------------------------------------------------------ Chemistries  Recent Labs  Lab 11/17/17 0850 11/18/17 0805  NA 135 139  K 3.6 4.0  CL 96* 101  CO2 32  31  GLUCOSE 149* 120*  BUN 20 22*  CREATININE <0.30* <0.30*  CALCIUM 8.5* 8.3*  AST 25  --   ALT 15  --   ALKPHOS 63  --   BILITOT 0.4  --    ------------------------------------------------------------------------------------------------------------------  Cardiac Enzymes No results for input(s): TROPONINI in the last 168 hours. ------------------------------------------------------------------------------------------------------------------  RADIOLOGY:  Dg Chest Port 1 View  Result Date: 11/19/2017 CLINICAL DATA:  Respiratory failure. EXAM: PORTABLE CHEST 1 VIEW COMPARISON:  11/17/2017. FINDINGS: Tracheostomy tube noted in stable position. Right PICC line noted in stable position. Tiny catheter noted over the left upper chest, this may be outside the patient. Continued evaluation on follow-up exam suggested. Heart size stable. Continued progression of bibasilar atelectasis and infiltrates. Small bilateral pleural effusions. No pneumothorax. IMPRESSION: 1. Tracheostomy tube and right PICC line stable position. Tiny catheter is noted over the left upper chest, this may be outside the patient. Continued evaluation on follow-up exams suggested. 2. Continued progression of bibasilar atelectasis and infiltrates. Small bilateral pleural effusions. Electronically Signed   By: Marcello Moores  Register   On: 11/19/2017 06:04     ASSESSMENT AND PLAN:  57 year old female patient currently in ICU for respiratory failure, hypotension, pneumonia  -Acute on chronic hypoxic respiratory failure Status post tracheostomy on vent support Currently still on FiO2 of 30% Continue vent bundle  Intensivist follow-up  -Health Care Associated Pneumonia Tracheal aspirates : Staph Aureus On IV cefazolin abx  Continues to have fever  - Hypotension improved Off IV pressor meds  -Critical illness polyneuropathy  -Acute on chronic COPD exacerbation Continue nebulization treatments  -Anxiety disorder anxiolytic  medications  -  Stage II decubitus ulcer Wound care protocol as per nursing staff   All the records are reviewed and case discussed with Care Management/Social Worker. Management plans discussed with the patient, family and they are in agreement.  CODE STATUS: Partial  DVT Prophylaxis: SCDs  TOTAL TIME TAKING CARE OF THIS PATIENT: 34 minutes.   POSSIBLE D/C IN 3 to 4 DAYS, DEPENDING ON CLINICAL CONDITION.  Dustin Flock M.D on 11/20/2017 at 1:22 PM  Between 7am to 6pm - Pager - [phone]  After 6pm go to [email or web address] - password [email or web address] [facility]  Office  [phone]  CC: Primary care physician; Center, [facility]  Note: This dictation was prepared with Dragon dictation along with smaller phrase technology. Any transcriptional errors that result from this process are unintentional.

## 2017-11-21 DIAGNOSIS — E43 Unspecified severe protein-calorie malnutrition: Secondary | ICD-10-CM

## 2017-11-21 LAB — VANCOMYCIN, TROUGH

## 2017-11-21 LAB — CULTURE, BLOOD (ROUTINE X 2): SPECIAL REQUESTS: ADEQUATE

## 2017-11-21 LAB — GLUCOSE, CAPILLARY
GLUCOSE-CAPILLARY: 93 mg/dL (ref 65–99)
Glucose-Capillary: 84 mg/dL (ref 65–99)

## 2017-11-21 NOTE — Progress Notes (Signed)
Responsive this morning.  Mouthing words.  Follows commands.  Remains on full ventilator support.  No distress.  Vitals:   11/21/17 1159 11/21/17 1200 11/21/17 1300 11/21/17 1400  BP:  113/77 124/78 120/76  Pulse:  92 95 93  Resp:  18 (!) 23 20  Temp:  99.3 F (37.4 C) 99.3 F (37.4 C) 99.3 F (37.4 C)  TempSrc:      SpO2: 94% 95% 94% 97%  Weight:      Height:       NAD, RASS 0 HEENT: NCAT, sclerae white No JVD noted Diminished breath sounds, no wheezes RRR, no M Abdomen soft, NT, NABS BUE, BLE pitting edema Diffuse profound weakness, no focal deficits   BMP Latest Ref Rng & Units 11/18/2017 11/17/2017 11/15/2017  Glucose 65 - 99 mg/dL 120(H) 149(H) 128(H)  BUN 6 - 20 mg/dL 22(H) 20 25(H)  Creatinine 0.44 - 1.00 mg/dL <0.30(L) <0.30(L) <0.30(L)  Sodium 135 - 145 mmol/L 139 135 139  Potassium 3.5 - 5.1 mmol/L 4.0 3.6 4.1  Chloride 101 - 111 mmol/L 101 96(L) 96(L)  CO2 22 - 32 mmol/L 31 32 36(H)  Calcium 8.9 - 10.3 mg/dL 8.3(L) 8.5(L) 9.1    CBC Latest Ref Rng & Units 11/20/2017 11/18/2017 11/17/2017  WBC 3.6 - 11.0 K/uL 10.3 16.5(H) 13.7(H)  Hemoglobin 12.0 - 16.0 g/dL 7.7(L) 8.3(L) 7.9(L)  Hematocrit 35.0 - 47.0 % 24.6(L) 25.6(L) 25.1(L)  Platelets 150 - 440 K/uL 280 311 352   Results for orders placed or performed during the hospital encounter of 09/30/17  CULTURE, BLOOD (ROUTINE X 2) w Reflex to ID Panel     Status: None   Collection Time: 09/30/17  5:43 PM  Result Value Ref Range Status   Specimen Description BLOOD CENTRAL LINE  Final   Special Requests   Final    BOTTLES DRAWN AEROBIC AND ANAEROBIC Blood Culture adequate volume   Culture   Final    NO GROWTH 5 DAYS Performed at [facility], [address] [place], [place] [address]    Report Status 10/05/2017 FINAL  Final  MRSA PCR Screening     Status: None   Collection Time: 09/30/17  8:02 PM  Result Value Ref Range Status   MRSA by PCR NEGATIVE NEGATIVE Final    Comment:        The GeneXpert  MRSA Assay (FDA approved for NASAL specimens only), is one component of a comprehensive MRSA colonization surveillance program. It is not intended to diagnose MRSA infection nor to guide or monitor treatment for MRSA infections. Performed at [facility], [address], [place], [place] [address]   Urine Culture     Status: None   Collection Time: 09/30/17  8:04 PM  Result Value Ref Range Status   Specimen Description   Final    URINE, RANDOM Performed at [facility], [address], [place], [place] [address]    Special Requests   Final    NONE Performed at [facility], [address] [place], [place] [address]    Culture   Final    NO GROWTH Performed at [facility], [place] [address] [place], [place] [address]    Report Status 10/02/2017 FINAL  Final  CULTURE, BLOOD (ROUTINE X 2) w Reflex to ID Panel     Status: None   Collection Time: 09/30/17  8:16 PM  Result Value Ref Range Status   Specimen Description BLOOD L HAND  Final  Special Requests   Final    BOTTLES DRAWN AEROBIC AND ANAEROBIC Blood Culture results may not be optimal due to an inadequate volume of blood received in culture bottles   Culture   Final    NO GROWTH 5 DAYS Performed at [facility], [address] [place], [place] [address]    Report Status 10/05/2017 FINAL  Final  Culture, respiratory (NON-Expectorated)     Status: None   Collection Time: 09/30/17  9:07 PM  Result Value Ref Range Status   Specimen Description   Final    TRACHEAL ASPIRATE Performed at [facility], [address], [place], [place] [address]    Special Requests   Final    NONE Performed at [facility], [address] [place], [place] [address]    Gram Stain   Final    MODERATE WBC PRESENT, PREDOMINANTLY PMN FEW GRAM POSITIVE COCCI IN PAIRS IN CLUSTERS FEW GRAM NEGATIVE RODS    Culture   Final    FEW Consistent with normal respiratory  flora. Performed at [facility], [place] [address] [place], [place] [address]    Report Status 10/03/2017 FINAL  Final  CULTURE, BLOOD (ROUTINE X 2) w Reflex to ID Panel     Status: None   Collection Time: 10/05/17 11:48 AM  Result Value Ref Range Status   Specimen Description BLOOD LAC  Final   Special Requests   Final    BOTTLES DRAWN AEROBIC AND ANAEROBIC Blood Culture adequate volume   Culture   Final    NO GROWTH 5 DAYS Performed at [facility], [address] [place], [place] [address]    Report Status 10/10/2017 FINAL  Final  CULTURE, BLOOD (ROUTINE X 2) w Reflex to ID Panel     Status: None   Collection Time: 10/05/17 11:55 AM  Result Value Ref Range Status   Specimen Description BLOOD LT HAND  Final   Special Requests   Final    BOTTLES DRAWN AEROBIC AND ANAEROBIC Blood Culture adequate volume   Culture   Final    NO GROWTH 5 DAYS Performed at [facility], [address] [place], [place] [address]    Report Status 10/10/2017 FINAL  Final  Culture, expectorated sputum-assessment     Status: None   Collection Time: 10/05/17  2:18 PM  Result Value Ref Range Status   Specimen Description TRACHEAL ASPIRATE  Final   Special Requests NONE  Final   Sputum evaluation   Final    THIS SPECIMEN IS ACCEPTABLE FOR SPUTUM CULTURE Performed at [facility], [address], [place], [place] [address]    Report Status 10/05/2017 FINAL  Final  Culture, respiratory (NON-Expectorated)     Status: None   Collection Time: 10/05/17  2:18 PM  Result Value Ref Range Status   Specimen Description   Final    TRACHEAL ASPIRATE Performed at [facility], [address] [place], [place] [address]    Special Requests   Final    NONE Reflexed from [id number] Performed at [facility], [address] [place], [place] [address]    Gram Stain   Final    MODERATE WBC PRESENT,BOTH PMN AND MONONUCLEAR NO SQUAMOUS EPITHELIAL CELLS SEEN FEW  YEAST WITH PSEUDOHYPHAE RARE GRAM POSITIVE COCCI IN CLUSTERS Performed at [facility], [place] [address] [place],  [address]    Culture FEW CANDIDA ALBICANS FEW CANDIDA DUBLINIENSIS   Final  Report Status 10/08/2017 FINAL  Final  CULTURE, BLOOD (ROUTINE X 2) w Reflex to ID Panel     Status: Abnormal   Collection Time: 10/18/17 10:51 AM  Result Value Ref Range Status   Specimen Description   Final    BLOOD LEFT ANTECUBITAL Performed at [facility], [address] [place], [place] [address]    Special Requests   Final    BOTTLES DRAWN AEROBIC AND ANAEROBIC Blood Culture adequate volume Performed at [facility], [address] [place], [place] [address]    Culture  Setup Time   Final    GRAM POSITIVE COCCI ANAEROBIC BOTTLE ONLY CRITICAL RESULT CALLED TO, READ BACK BY AND VERIFIED WITH: MELISSA MACCIA 10/19/17 @ 0714  MLK    Culture (A)  Final    STAPHYLOCOCCUS SPECIES (COAGULASE NEGATIVE) THE SIGNIFICANCE OF ISOLATING THIS ORGANISM FROM A SINGLE SET OF BLOOD CULTURES WHEN MULTIPLE SETS ARE DRAWN IS UNCERTAIN. PLEASE NOTIFY THE MICROBIOLOGY DEPARTMENT WITHIN ONE WEEK IF SPECIATION AND SENSITIVITIES ARE REQUIRED. Performed at [facility], [place] [address] [place], [place] [address]    Report Status 10/21/2017 FINAL  Final  Blood Culture ID Panel (Reflexed)     Status: Abnormal   Collection Time: 10/18/17 10:51 AM  Result Value Ref Range Status   Enterococcus species NOT DETECTED NOT DETECTED Final   Listeria monocytogenes NOT DETECTED NOT DETECTED Final   Staphylococcus species DETECTED (A) NOT DETECTED Final    Comment: Methicillin (oxacillin) resistant coagulase negative staphylococcus. Possible blood culture contaminant (unless isolated from more than one blood culture draw or clinical case suggests pathogenicity). No antibiotic treatment is indicated for blood  culture contaminants. CRITICAL RESULT CALLED TO, READ BACK BY AND VERIFIED  WITH: MELISSA Newald 10/19/17 @ 7416  [place]    Staphylococcus aureus NOT DETECTED NOT DETECTED Final   Methicillin resistance DETECTED (A) NOT DETECTED Final    Comment: CRITICAL RESULT CALLED TO, READ BACK BY AND VERIFIED WITH: MELISSA MACCIA 10/19/17 @ 3845  [place]    Streptococcus species NOT DETECTED NOT DETECTED Final   Streptococcus agalactiae NOT DETECTED NOT DETECTED Final   Streptococcus pneumoniae NOT DETECTED NOT DETECTED Final   Streptococcus pyogenes NOT DETECTED NOT DETECTED Final   Acinetobacter baumannii NOT DETECTED NOT DETECTED Final   Enterobacteriaceae species NOT DETECTED NOT DETECTED Final   Enterobacter cloacae complex NOT DETECTED NOT DETECTED Final   Escherichia coli NOT DETECTED NOT DETECTED Final   Klebsiella oxytoca NOT DETECTED NOT DETECTED Final   Klebsiella pneumoniae NOT DETECTED NOT DETECTED Final   Proteus species NOT DETECTED NOT DETECTED Final   Serratia marcescens NOT DETECTED NOT DETECTED Final   Haemophilus influenzae NOT DETECTED NOT DETECTED Final   Neisseria meningitidis NOT DETECTED NOT DETECTED Final   Pseudomonas aeruginosa NOT DETECTED NOT DETECTED Final   Candida albicans NOT DETECTED NOT DETECTED Final   Candida glabrata NOT DETECTED NOT DETECTED Final   Candida krusei NOT DETECTED NOT DETECTED Final   Candida parapsilosis NOT DETECTED NOT DETECTED Final   Candida tropicalis NOT DETECTED NOT DETECTED Final    Comment: Performed at [facility], [address] [place], [place] [address]  Culture, respiratory (NON-Expectorated)     Status: None   Collection Time: 10/18/17 11:09 AM  Result Value Ref Range Status   Specimen Description   Final    TRACHEAL ASPIRATE Performed at [facility], [address], [place], [place] [address]    Special Requests   Final  NONE Performed at [facility], [address] [place], [place] [address]    Gram Stain   Final    MODERATE WBC PRESENT, PREDOMINANTLY PMN NO  ORGANISMS SEEN Performed at [place] [address] [place], [place] [address]    Culture   Final    RARE CANDIDA DUBLINIENSIS RARE FUNGUS (MOLD) ISOLATED, PROBABLE CONTAMINANT/COLONIZER (SAPROPHYTE). CONTACT MICROBIOLOGY IF FURTHER IDENTIFICATION REQUIRED [phone].    Report Status 10/21/2017 FINAL  Final  CULTURE, BLOOD (ROUTINE X 2) w Reflex to ID Panel     Status: None   Collection Time: 10/18/17  4:09 PM  Result Value Ref Range Status   Specimen Description BLOOD L FA  Final   Special Requests   Final    BOTTLES DRAWN AEROBIC AND ANAEROBIC Blood Culture adequate volume   Culture   Final    NO GROWTH 5 DAYS Performed at [facility], [address] [place], [place] [address]    Report Status 10/23/2017 FINAL  Final  CULTURE, BLOOD (ROUTINE X 2) w Reflex to ID Panel     Status: None   Collection Time: 10/19/17  8:36 AM  Result Value Ref Range Status   Specimen Description BLOOD BLOOD LEFT HAND  Final   Special Requests   Final    BOTTLES DRAWN AEROBIC AND ANAEROBIC Blood Culture results may not be optimal due to an inadequate volume of blood received in culture bottles   Culture   Final    NO GROWTH 5 DAYS Performed at [facility], [address] [place], [place] [address]    Report Status 10/24/2017 FINAL  Final  CULTURE, BLOOD (ROUTINE X 2) w Reflex to ID Panel     Status: None   Collection Time: 10/19/17  8:52 AM  Result Value Ref Range Status   Specimen Description BLOOD A-LINE DRAW  Final   Special Requests   Final    BOTTLES DRAWN AEROBIC AND ANAEROBIC Blood Culture adequate volume   Culture   Final    NO GROWTH 5 DAYS Performed at [facility], [address], [place], [place] [address]    Report Status 10/24/2017 FINAL  Final  Culture, respiratory (NON-Expectorated)     Status: None   Collection Time: 10/23/17 11:36 PM  Result Value Ref Range Status   Specimen Description   Final    TRACHEAL ASPIRATE Performed  at [facility], [address], [place], [place] [address]    Special Requests   Final    NONE Performed at [facility], [address] [place], [place] [address]    Gram Stain   Final    RARE WBC PRESENT, PREDOMINANTLY PMN NO ORGANISMS SEEN Performed at [facility], [place] [address] [place], [place] [address]    Culture   Final    Consistent with normal respiratory flora. FUNGUS (MOLD) ISOLATED, PROBABLE CONTAMINANT/COLONIZER (SAPROPHYTE). CONTACT MICROBIOLOGY IF FURTHER IDENTIFICATION REQUIRED [phone].    Report Status 10/26/2017 FINAL  Final  Urine Culture     Status: Abnormal   Collection Time: 11/01/17  4:57 AM  Result Value Ref Range Status   Specimen Description   Final    URINE, CLEAN CATCH Performed at [facility], [address], [place], [place] [address]    Special Requests   Final    NONE Performed at [facility], [address], [place],  [address]    Culture (A)  Final    <10,000  COLONIES/mL INSIGNIFICANT GROWTH Performed at [place] [address] [place], [place] [address]    Report Status 11/02/2017 FINAL  Final  CULTURE, BLOOD (ROUTINE X 2) w Reflex to ID Panel     Status: None   Collection Time: 11/01/17  1:28 PM  Result Value Ref Range Status   Specimen Description BLOOD LINE  Final   Special Requests   Final    BOTTLES DRAWN AEROBIC AND ANAEROBIC Blood Culture adequate volume   Culture   Final    NO GROWTH 5 DAYS Performed at [facility], [address] [place], [place] [address]    Report Status 11/06/2017 FINAL  Final  CULTURE, BLOOD (ROUTINE X 2) w Reflex to ID Panel     Status: None   Collection Time: 11/01/17  1:51 PM  Result Value Ref Range Status   Specimen Description BLOOD R HAND  Final   Special Requests   Final    BOTTLES DRAWN AEROBIC AND ANAEROBIC Blood Culture results may not be optimal due to an inadequate volume of blood received in culture  bottles   Culture   Final    NO GROWTH 5 DAYS Performed at [facility], [address] [place], [place] [address]    Report Status 11/06/2017 FINAL  Final  Culture, respiratory (NON-Expectorated)     Status: None   Collection Time: 11/04/17 12:20 PM  Result Value Ref Range Status   Specimen Description   Final    SPUTUM Performed at [facility], [address] [place], [place] [address]    Special Requests   Final    NONE Performed at [facility], [address] [place], [place] [address]    Gram Stain   Final    ABUNDANT WBC PRESENT,BOTH PMN AND MONONUCLEAR RARE GRAM POSITIVE COCCI Performed at [facility], [place] [address], [place], [place] [address]    Culture   Final    RARE FUNGUS (MOLD) ISOLATED, PROBABLE CONTAMINANT/COLONIZER (SAPROPHYTE). CONTACT MICROBIOLOGY IF FURTHER IDENTIFICATION REQUIRED [phone].   Report Status 11/06/2017 FINAL  Final  Culture, respiratory (NON-Expectorated)     Status: None   Collection Time: 11/17/17  7:57 AM  Result Value Ref Range Status   Specimen Description   Final    TRACHEAL ASPIRATE Performed at [facility], [address] [place], [place] [address]    Special Requests   Final    NONE Performed at [facility], [address] [place], [place] [address]    Gram Stain   Final    MODERATE WBC PRESENT,BOTH PMN AND MONONUCLEAR MODERATE GRAM POSITIVE COCCI IN PAIRS RARE GRAM NEGATIVE RODS Performed at [facility], [place] [address] [place], [place] [address]    Culture ABUNDANT STAPHYLOCOCCUS AUREUS  Final   Report Status 11/19/2017 FINAL  Final   Organism ID, Bacteria STAPHYLOCOCCUS AUREUS  Final      Susceptibility   Staphylococcus aureus - MIC*    CIPROFLOXACIN <=0.5 SENSITIVE Sensitive     ERYTHROMYCIN <=0.25 SENSITIVE Sensitive     GENTAMICIN <=0.5 SENSITIVE Sensitive     OXACILLIN <=0.25 SENSITIVE Sensitive     TETRACYCLINE <=1 SENSITIVE Sensitive      VANCOMYCIN 1 SENSITIVE Sensitive     TRIMETH/SULFA <=10 SENSITIVE Sensitive     CLINDAMYCIN <=0.25 SENSITIVE Sensitive     RIFAMPIN <=0.5 SENSITIVE Sensitive     Inducible Clindamycin NEGATIVE Sensitive     * ABUNDANT STAPHYLOCOCCUS AUREUS  CULTURE, BLOOD (ROUTINE X 2) w Reflex to ID Panel     Status: None (Preliminary result)   Collection Time: 11/17/17  9:09 AM  Result Value Ref Range Status   Specimen Description BLOOD LEFT HAND  Final   Special Requests   Final    BOTTLES DRAWN AEROBIC AND ANAEROBIC Blood Culture adequate volume   Culture   Final    NO GROWTH 4 DAYS Performed at [facility], [address] [place], [place] [address]    Report Status PENDING  Incomplete  CULTURE, BLOOD (ROUTINE X 2) w Reflex to ID Panel     Status: Abnormal   Collection Time: 11/17/17 10:05 AM  Result Value Ref Range Status   Specimen Description   Final    BLOOD L HAND Performed at [facility], [address] [place], [place] [address]    Special Requests   Final    BOTTLES DRAWN AEROBIC AND ANAEROBIC Blood Culture adequate volume Performed at [facility], [address] [place], [place] [address]    Culture  Setup Time   Final    GRAM POSITIVE COCCI ANAEROBIC BOTTLE ONLY CRITICAL RESULT CALLED TO, READ BACK BY AND VERIFIED WITH: H. ZOMPA PHARMD, AT 1056 11/20/17 BY D. VANHOOK    Culture (A)  Final    MICROCOCCUS SPECIES Standardized susceptibility testing for this organism is not available. Performed at [facility], [place] [address] [place], [place] [address]    Report Status 11/21/2017 FINAL  Final    Vanc 06/16 >> 06/18 Cefepime 06/16 >> 06/18 Cefazolin 06/18 >>   CXR: No new film   IMP: Very severe COPD Prolonged mechanical ventilation Failure to wean- vent dependency Tracheostomy tube status MSSA HCAP/VAP 06/16  Severe sepsis with hypotension, resolved ICU acquired anemia G-tube status Anxiety/panic disorder Major depression   Severe protein calorie malnutrition Anasarca Chronic anemia  PLAN/REC: Cont vent support - settings reviewed and/or adjusted Cont to wean in PSV <> ATC as tolerated Cont vent bundle Daily SBT if/when meets criteria  Continue nebulized steroids and bronchodilators Monitor temp, WBC count Micro and abx as above  DVT px: SQ heparin Monitor CBC intermittently Transfuse per usual guidelines  Cont scheduled clonazepam, quetiapine - dose reduced 06/18 Continue paroxetine per psychiatry Continue PRN alprazolam  Family: not available for update.  Cammie Sickle, MD PCCM service Pager [phone] 11/21/2017 3:57 PM

## 2017-11-21 NOTE — Care Management (Addendum)
RNCM spoke with Michelle [name] with [facility] [phone] regarding home assessment for ventilator. [facility] is still planning to go out to patient's address where she resides with daughter Michelle [name] [phone] and significant other Michelle [name] [phone] today (11/21/17 at 12 noon).  I have left message for [name] with appointment plan for today and the need for her to call Meredith/Nello Corro RNCM ASAP so that home vent can be tried while patient is in ICU at [facility] does not have a voicemail set up however I did send a confidential text to his number advising him to reach out to [facility] ASAP.

## 2017-11-21 NOTE — Progress Notes (Signed)
[place] [facility] [place] NAME: Michelle [name]    MR#:  [id number]  DATE OF BIRTH:  Feb 23, 1961  SUBJECTIVE:  Currently on tracheostomy with ventilator support    REVIEW OF SYSTEMS:    ROS Unable to obtain secondary to critical illness  DRUG ALLERGIES:   Allergies  Allergen Reactions  . Carvedilol Other (See Comments)    Pt gets "sick" when on medication    VITALS:  Blood pressure 120/76, pulse 93, temperature 99.3 F (37.4 C), resp. rate 20, height 4\' 9"  (1.448 m), weight 68.6 kg (151 lb 3.8 oz), last menstrual period 03/06/2005, SpO2 97 %.  PHYSICAL EXAMINATION:   Physical Exam  GENERAL:  56 y.o.-year-old patient lying in the bed with tracheostomy on ventilator support EYES: Pupils equal, round, reactive to light and accommodation. No scleral icterus. Extraocular muscles intact.  HEENT: Head atraumatic, normocephalic.  Oropharynx clear NECK:  Supple, no jugular venous distention. No thyroid enlargement, no tenderness.  Has tracheostomy with vent support FIO2 30 % Peep 5 Rate 18 TV 450 ml LUNGS: Decreased breath sounds bilaterally, rales heard in both lungs. No use of accessory muscles of respiration.  CARDIOVASCULAR: S1, S2 normal. No murmurs, rubs, or gallops.  ABDOMEN: Soft, nontender, nondistended. Bowel sounds present. No organomegaly or mass.  Peg tube present EXTREMITIES: No cyanosis, clubbing or edema b/l.    NEUROLOGIC: Not oriented to time, place and person PSYCHIATRIC: could not be assessed SKIN: Has decubitus ulcer  LABORATORY PANEL:   CBC Recent Labs  Lab 11/20/17 0310  WBC 10.3  HGB 7.7*  HCT 24.6*  PLT 280   ------------------------------------------------------------------------------------------------------------------ Chemistries  Recent Labs  Lab 11/17/17 0850 11/18/17 0805  NA 135 139  K 3.6 4.0  CL 96* 101  CO2 32 31  GLUCOSE 149* 120*  BUN 20 22*  CREATININE <0.30* <0.30*  CALCIUM 8.5*  8.3*  AST 25  --   ALT 15  --   ALKPHOS 63  --   BILITOT 0.4  --    ------------------------------------------------------------------------------------------------------------------  Cardiac Enzymes No results for input(s): TROPONINI in the last 168 hours. ------------------------------------------------------------------------------------------------------------------  RADIOLOGY:  No results found.   ASSESSMENT AND PLAN:  57 year old female patient currently in ICU for respiratory failure, hypotension, pneumonia  -Acute on chronic hypoxic respiratory failure Status post tracheostomy on vent support Currently still on FiO2 of 30% Continue vent bundle  Intensivist follow-up  -Health Care Associated Pneumonia Tracheal aspirates : Staph Aureus On IV cefazolin abx  Continues to have fever  - Hypotension improved Off IV pressor meds  -Critical illness polyneuropathy  -Acute on chronic COPD exacerbation Continue nebulization treatments  -Anxiety disorder anxiolytic medications  -Stage II decubitus ulcer Wound care protocol as per nursing staff   All the records are reviewed and case discussed with Care Management/Social Worker. Management plans discussed with the patient, family and they are in agreement.  CODE STATUS: Partial  DVT Prophylaxis: SCDs  TOTAL TIME TAKING CARE OF THIS PATIENT: 34 minutes.   POSSIBLE D/C IN 3 to 4 DAYS, DEPENDING ON CLINICAL CONDITION.  Dustin Flock M.D on 11/21/2017 at 3:37 PM  Between 7am to 6pm - Pager - [phone]  After 6pm go to [email or web address] - password [email or web address] [facility]  Office  [phone]  CC: Primary care physician; Center, [facility]  Note: This dictation was prepared with Dragon dictation along with smaller phrase technology. Any transcriptional errors that result from this process are unintentional.

## 2017-11-21 NOTE — Care Management (Signed)
[facility] notified RNCM that there were attempts made to go to the home in order to assess the safety and conditions of the living environment.. Per [facility] there was no answer at the door and there were attempts to contact any other homes in the area to confirm the address but attempts were unsuccessful. Multiple attempts to contact family have been unsuccessful. Will continue to follow and aide in anyway possible.

## 2017-11-21 NOTE — Progress Notes (Signed)
   11/21/17 1343  Clinical Encounter Type  Visited With Patient  Visit Type Follow-up  Spiritual Encounters  Spiritual Needs Prayer   Chaplain attempted check in with patient; patient did not wake to chaplain's voice.  Chaplain offered silent prayer and will continue to follow.

## 2017-11-21 NOTE — Progress Notes (Signed)
Pharmacy Antibiotic Note  Michelle Michelle [name] is a 57 y.o. female admitted on 09/30/2017 with staph PNA.  Pharmacy has been consulted for cefazolin dosing.  Plan: Continue cefazolin 1 gram q8h.   Height: 4\' 9"  (144.8 cm) Weight: 151 lb 3.8 oz (68.6 kg) IBW/kg (Calculated) : 38.6  Temp (24hrs), Avg:98.7 F (37.1 C), Michelle [name]:97.5 F (36.4 C), Max:99.7 F (37.6 C)  Recent Labs  Lab 11/15/17 0435 11/17/17 0850 11/18/17 0805 11/19/17 0911 11/20/17 0310 11/21/17 0748  WBC 6.9 13.7* 16.5*  --  10.3  --   CREATININE <0.30* <0.30* <0.30*  --   --   --   VANCOTROUGH  --   --   --  11*  --  <4*    CrCl cannot be calculated (This lab value cannot be used to calculate CrCl because it is not a number: <0.30).    Allergies  Allergen Reactions  . Carvedilol Other (See Comments)    Pt gets "sick" when on medication    Antimicrobials this admission: 4/29 levofloxacin >> 4/30 4/30 Zosyn >> 5/7 5/4 vancomycin >> 5/6 5/8 erythromycin >> 5/20 5/18 vancomycin >> 5/20 5/30 cefazolin >> 5/30 5/31 ceftriaxone >> 6/2 6/4 cefepime >>  6/4 6/16 vancomycin >> 6/18 6/16 cefepime >> 6/18 6/18 cefazolin >>    Microbiology results: 4/29 MRSA PCR: negative 4/29 UCx: no growth final 4/29 RespCx/tracheal aspirate: few consistent with normal flora 4/29 BCx: no growth final 5/4 RespCx/tracheal aspirate: few candidaa albicans, few candida dubliniensis 5/4 BCx: no growth final 5/17 BCx: MSRA 5/17 RespCX/tracheal aspirate: rare candida dubliniensis 5/18 BCx: no growth final 5/31 UCx: insignificant growth 5/31 BCx: no growth final 6/3 Sputum: rare gram positive cocci 6/16 BCx: no growth x2 days 6/16 RespCx/tracheal aspirate: abundant staph aureus, (MSSA)    Thank you for allowing pharmacy to be a part of this patient's care.   Michelle Michelle [name] L 11/21/2017 8:18 PM

## 2017-11-22 ENCOUNTER — Inpatient Hospital Stay: Payer: Medicaid Other

## 2017-11-22 DIAGNOSIS — D649 Anemia, unspecified: Secondary | ICD-10-CM

## 2017-11-22 LAB — CBC WITH DIFFERENTIAL/PLATELET
BAND NEUTROPHILS: 5 %
BASOS ABS: 0.1 10*3/uL (ref 0–0.1)
BASOS PCT: 1 %
BLASTS: 0 %
EOS ABS: 0 10*3/uL (ref 0–0.7)
EOS PCT: 0 %
HCT: 24.8 % — ABNORMAL LOW (ref 35.0–47.0)
HEMOGLOBIN: 7.9 g/dL — AB (ref 12.0–16.0)
LYMPHS ABS: 1.6 10*3/uL (ref 1.0–3.6)
LYMPHS PCT: 24 %
MCH: 27.7 pg (ref 26.0–34.0)
MCHC: 31.9 g/dL — AB (ref 32.0–36.0)
MCV: 86.9 fL (ref 80.0–100.0)
METAMYELOCYTES PCT: 2 %
MONO ABS: 0.6 10*3/uL (ref 0.2–0.9)
MONOS PCT: 9 %
Myelocytes: 1 %
NEUTROS ABS: 4.2 10*3/uL (ref 1.4–6.5)
Neutrophils Relative %: 58 %
OTHER: 0 %
Platelets: 345 10*3/uL (ref 150–440)
Promyelocytes Relative: 0 %
RBC: 2.86 MIL/uL — ABNORMAL LOW (ref 3.80–5.20)
RDW: 17.2 % — AB (ref 11.5–14.5)
WBC: 6.5 10*3/uL (ref 3.6–11.0)
nRBC: 0 /100 WBC

## 2017-11-22 LAB — GLUCOSE, CAPILLARY
GLUCOSE-CAPILLARY: 104 mg/dL — AB (ref 65–99)
GLUCOSE-CAPILLARY: 104 mg/dL — AB (ref 65–99)
GLUCOSE-CAPILLARY: 118 mg/dL — AB (ref 65–99)
Glucose-Capillary: 120 mg/dL — ABNORMAL HIGH (ref 65–99)

## 2017-11-22 LAB — BASIC METABOLIC PANEL
Anion gap: 8 (ref 5–15)
BUN: 20 mg/dL (ref 6–20)
CALCIUM: 8.8 mg/dL — AB (ref 8.9–10.3)
CHLORIDE: 99 mmol/L — AB (ref 101–111)
CO2: 34 mmol/L — ABNORMAL HIGH (ref 22–32)
Glucose, Bld: 121 mg/dL — ABNORMAL HIGH (ref 65–99)
Potassium: 3.6 mmol/L (ref 3.5–5.1)
SODIUM: 141 mmol/L (ref 135–145)

## 2017-11-22 LAB — CULTURE, BLOOD (ROUTINE X 2)
CULTURE: NO GROWTH
SPECIAL REQUESTS: ADEQUATE

## 2017-11-22 LAB — PHOSPHORUS: Phosphorus: 2.8 mg/dL (ref 2.5–4.6)

## 2017-11-22 LAB — MAGNESIUM: MAGNESIUM: 1.9 mg/dL (ref 1.7–2.4)

## 2017-11-22 MED ORDER — POTASSIUM CHLORIDE 20 MEQ PO PACK
20.0000 meq | PACK | Freq: Once | ORAL | Status: AC
  Administered 2017-11-22: 20 meq via ORAL
  Filled 2017-11-22: qty 1

## 2017-11-22 NOTE — Progress Notes (Signed)
Responsive this morning.  Mouthing words.  Follows simple commands.  Remains on full ventilator support.  No distress.  Vitals:   11/22/17 0300 11/22/17 0316 11/22/17 0400 11/22/17 0500  BP: 103/71  109/80 91/65  Pulse: 92  93 87  Resp: 17  19 18   Temp: (!) 97.2 F (36.2 C)  (!) 97.3 F (36.3 C) 99 F (37.2 C)  TempSrc:      SpO2: 99%  96% 96%  Weight:  152 lb 1.9 oz (69 kg)    Height:       NAD, RASS 0 HEENT: NCAT, sclerae white No JVD noted Diminished breath sounds, no wheezes RRR, no M Abdomen soft, NT, NABS BUE, BLE pitting edema Diffuse profound weakness, no focal deficits   BMP Latest Ref Rng & Units 11/22/2017 11/18/2017 11/17/2017  Glucose 65 - 99 mg/dL 121(H) 120(H) 149(H)  BUN 6 - 20 mg/dL 20 22(H) 20  Creatinine 0.44 - 1.00 mg/dL <0.30(L) <0.30(L) <0.30(L)  Sodium 135 - 145 mmol/L 141 139 135  Potassium 3.5 - 5.1 mmol/L 3.6 4.0 3.6  Chloride 101 - 111 mmol/L 99(L) 101 96(L)  CO2 22 - 32 mmol/L 34(H) 31 32  Calcium 8.9 - 10.3 mg/dL 8.8(L) 8.3(L) 8.5(L)    CBC Latest Ref Rng & Units 11/22/2017 11/20/2017 11/18/2017  WBC 3.6 - 11.0 K/uL 6.5 10.3 16.5(H)  Hemoglobin 12.0 - 16.0 g/dL 7.9(L) 7.7(L) 8.3(L)  Hematocrit 35.0 - 47.0 % 24.8(L) 24.6(L) 25.6(L)  Platelets 150 - 440 K/uL 345 280 311   Results for orders placed or performed during the hospital encounter of 09/30/17  CULTURE, BLOOD (ROUTINE X 2) w Reflex to ID Panel     Status: None   Collection Time: 09/30/17  5:43 PM  Result Value Ref Range Status   Specimen Description BLOOD CENTRAL LINE  Final   Special Requests   Final    BOTTLES DRAWN AEROBIC AND ANAEROBIC Blood Culture adequate volume   Culture   Final    NO GROWTH 5 DAYS Performed at [facility], [address] [place], [place] [address]    Report Status 10/05/2017 FINAL  Final  MRSA PCR Screening     Status: None   Collection Time: 09/30/17  8:02 PM  Result Value Ref Range Status   MRSA by PCR NEGATIVE NEGATIVE Final    Comment:         The GeneXpert MRSA Assay (FDA approved for NASAL specimens only), is one component of a comprehensive MRSA colonization surveillance program. It is not intended to diagnose MRSA infection nor to guide or monitor treatment for MRSA infections. Performed at [facility], [address] [place], [place] [address]   Urine Culture     Status: None   Collection Time: 09/30/17  8:04 PM  Result Value Ref Range Status   Specimen Description   Final    URINE, RANDOM Performed at [facility], [address] [place], [place] [address]    Special Requests   Final    NONE Performed at [facility], [address] [place], [place] [address]    Culture   Final    NO GROWTH Performed at [facility], [place] [address], [place], [place] [address]    Report Status 10/02/2017 FINAL  Final  CULTURE, BLOOD (ROUTINE X 2) w Reflex to ID Panel     Status: None   Collection Time: 09/30/17  8:16 PM  Result Value Ref Range Status   Specimen  Description BLOOD L HAND  Final   Special Requests   Final    BOTTLES DRAWN AEROBIC AND ANAEROBIC Blood Culture results may not be optimal due to an inadequate volume of blood received in culture bottles   Culture   Final    NO GROWTH 5 DAYS Performed at [facility], [address] [place], [place] [address]    Report Status 10/05/2017 FINAL  Final  Culture, respiratory (NON-Expectorated)     Status: None   Collection Time: 09/30/17  9:07 PM  Result Value Ref Range Status   Specimen Description   Final    TRACHEAL ASPIRATE Performed at [facility], [address] [place], [place] [address]    Special Requests   Final    NONE Performed at [facility], [address] [place], [place] [address]    Gram Stain   Final    MODERATE WBC PRESENT, PREDOMINANTLY PMN FEW GRAM POSITIVE COCCI IN PAIRS IN CLUSTERS FEW GRAM NEGATIVE RODS    Culture   Final    FEW Consistent with normal  respiratory flora. Performed at [facility], [place] [address] [place], [place] [address]    Report Status 10/03/2017 FINAL  Final  CULTURE, BLOOD (ROUTINE X 2) w Reflex to ID Panel     Status: None   Collection Time: 10/05/17 11:48 AM  Result Value Ref Range Status   Specimen Description BLOOD LAC  Final   Special Requests   Final    BOTTLES DRAWN AEROBIC AND ANAEROBIC Blood Culture adequate volume   Culture   Final    NO GROWTH 5 DAYS Performed at [facility], [address] [place], [place] [address]    Report Status 10/10/2017 FINAL  Final  CULTURE, BLOOD (ROUTINE X 2) w Reflex to ID Panel     Status: None   Collection Time: 10/05/17 11:55 AM  Result Value Ref Range Status   Specimen Description BLOOD LT HAND  Final   Special Requests   Final    BOTTLES DRAWN AEROBIC AND ANAEROBIC Blood Culture adequate volume   Culture   Final    NO GROWTH 5 DAYS Performed at [facility], [address] [place], [place] [address]    Report Status 10/10/2017 FINAL  Final  Culture, expectorated sputum-assessment     Status: None   Collection Time: 10/05/17  2:18 PM  Result Value Ref Range Status   Specimen Description TRACHEAL ASPIRATE  Final   Special Requests NONE  Final   Sputum evaluation   Final    THIS SPECIMEN IS ACCEPTABLE FOR SPUTUM CULTURE Performed at [facility], [address], [place], [place] [address]    Report Status 10/05/2017 FINAL  Final  Culture, respiratory (NON-Expectorated)     Status: None   Collection Time: 10/05/17  2:18 PM  Result Value Ref Range Status   Specimen Description   Final    TRACHEAL ASPIRATE Performed at [facility], [address] [place], [place] [address]    Special Requests   Final    NONE Reflexed from [id number] Performed at [facility], [address] [place], [place] [address]    Gram Stain   Final    MODERATE WBC PRESENT,BOTH PMN AND MONONUCLEAR NO SQUAMOUS EPITHELIAL CELLS  SEEN FEW YEAST WITH PSEUDOHYPHAE RARE GRAM POSITIVE COCCI IN CLUSTERS Performed at [facility], [place] [address] [place],  [address]    Culture FEW CANDIDA ALBICANS  FEW CANDIDA DUBLINIENSIS   Final   Report Status 10/08/2017 FINAL  Final  CULTURE, BLOOD (ROUTINE X 2) w Reflex to ID Panel     Status: Abnormal   Collection Time: 10/18/17 10:51 AM  Result Value Ref Range Status   Specimen Description   Final    BLOOD LEFT ANTECUBITAL Performed at [facility], [address] [place], [place] [address]    Special Requests   Final    BOTTLES DRAWN AEROBIC AND ANAEROBIC Blood Culture adequate volume Performed at [facility], [address] [place], [place] [address]    Culture  Setup Time   Final    GRAM POSITIVE COCCI ANAEROBIC BOTTLE ONLY CRITICAL RESULT CALLED TO, READ BACK BY AND VERIFIED WITH: MELISSA MACCIA 10/19/17 @ 0714  MLK    Culture (A)  Final    STAPHYLOCOCCUS SPECIES (COAGULASE NEGATIVE) THE SIGNIFICANCE OF ISOLATING THIS ORGANISM FROM A SINGLE SET OF BLOOD CULTURES WHEN MULTIPLE SETS ARE DRAWN IS UNCERTAIN. PLEASE NOTIFY THE MICROBIOLOGY DEPARTMENT WITHIN ONE WEEK IF SPECIATION AND SENSITIVITIES ARE REQUIRED. Performed at [facility], [place] [address], [place], [place] [address]    Report Status 10/21/2017 FINAL  Final  Blood Culture ID Panel (Reflexed)     Status: Abnormal   Collection Time: 10/18/17 10:51 AM  Result Value Ref Range Status   Enterococcus species NOT DETECTED NOT DETECTED Final   Listeria monocytogenes NOT DETECTED NOT DETECTED Final   Staphylococcus species DETECTED (A) NOT DETECTED Final    Comment: Methicillin (oxacillin) resistant coagulase negative staphylococcus. Possible blood culture contaminant (unless isolated from more than one blood culture draw or clinical case suggests pathogenicity). No antibiotic treatment is indicated for blood  culture contaminants. CRITICAL RESULT CALLED TO, READ BACK BY AND  VERIFIED WITH: MELISSA Harrison 10/19/17 @ 5277  [place]    Staphylococcus aureus NOT DETECTED NOT DETECTED Final   Methicillin resistance DETECTED (A) NOT DETECTED Final    Comment: CRITICAL RESULT CALLED TO, READ BACK BY AND VERIFIED WITH: MELISSA MACCIA 10/19/17 @ 8242  [place]    Streptococcus species NOT DETECTED NOT DETECTED Final   Streptococcus agalactiae NOT DETECTED NOT DETECTED Final   Streptococcus pneumoniae NOT DETECTED NOT DETECTED Final   Streptococcus pyogenes NOT DETECTED NOT DETECTED Final   Acinetobacter baumannii NOT DETECTED NOT DETECTED Final   Enterobacteriaceae species NOT DETECTED NOT DETECTED Final   Enterobacter cloacae complex NOT DETECTED NOT DETECTED Final   Escherichia coli NOT DETECTED NOT DETECTED Final   Klebsiella oxytoca NOT DETECTED NOT DETECTED Final   Klebsiella pneumoniae NOT DETECTED NOT DETECTED Final   Proteus species NOT DETECTED NOT DETECTED Final   Serratia marcescens NOT DETECTED NOT DETECTED Final   Haemophilus influenzae NOT DETECTED NOT DETECTED Final   Neisseria meningitidis NOT DETECTED NOT DETECTED Final   Pseudomonas aeruginosa NOT DETECTED NOT DETECTED Final   Candida albicans NOT DETECTED NOT DETECTED Final   Candida glabrata NOT DETECTED NOT DETECTED Final   Candida krusei NOT DETECTED NOT DETECTED Final   Candida parapsilosis NOT DETECTED NOT DETECTED Final   Candida tropicalis NOT DETECTED NOT DETECTED Final    Comment: Performed at [facility], [address] [place], [place] [address]  Culture, respiratory (NON-Expectorated)     Status: None   Collection Time: 10/18/17 11:09 AM  Result Value Ref Range Status   Specimen Description   Final    TRACHEAL ASPIRATE Performed at [facility], [address], [place],  [address]  Special Requests   Final    NONE Performed at [facility], [address] [place], [place] [address]    Gram Stain   Final    MODERATE WBC PRESENT, PREDOMINANTLY  PMN NO ORGANISMS SEEN Performed at [facility], [place] [address], [place], [place] [address]    Culture   Final    RARE CANDIDA DUBLINIENSIS RARE FUNGUS (MOLD) ISOLATED, PROBABLE CONTAMINANT/COLONIZER (SAPROPHYTE). CONTACT MICROBIOLOGY IF FURTHER IDENTIFICATION REQUIRED [phone].    Report Status 10/21/2017 FINAL  Final  CULTURE, BLOOD (ROUTINE X 2) w Reflex to ID Panel     Status: None   Collection Time: 10/18/17  4:09 PM  Result Value Ref Range Status   Specimen Description BLOOD L FA  Final   Special Requests   Final    BOTTLES DRAWN AEROBIC AND ANAEROBIC Blood Culture adequate volume   Culture   Final    NO GROWTH 5 DAYS Performed at [facility], [address] [place], [place] [address]    Report Status 10/23/2017 FINAL  Final  CULTURE, BLOOD (ROUTINE X 2) w Reflex to ID Panel     Status: None   Collection Time: 10/19/17  8:36 AM  Result Value Ref Range Status   Specimen Description BLOOD BLOOD LEFT HAND  Final   Special Requests   Final    BOTTLES DRAWN AEROBIC AND ANAEROBIC Blood Culture results may not be optimal due to an inadequate volume of blood received in culture bottles   Culture   Final    NO GROWTH 5 DAYS Performed at [facility], [address] [place], [place] [address]    Report Status 10/24/2017 FINAL  Final  CULTURE, BLOOD (ROUTINE X 2) w Reflex to ID Panel     Status: None   Collection Time: 10/19/17  8:52 AM  Result Value Ref Range Status   Specimen Description BLOOD A-LINE DRAW  Final   Special Requests   Final    BOTTLES DRAWN AEROBIC AND ANAEROBIC Blood Culture adequate volume   Culture   Final    NO GROWTH 5 DAYS Performed at [facility], [address] [place], [place] [address]    Report Status 10/24/2017 FINAL  Final  Culture, respiratory (NON-Expectorated)     Status: None   Collection Time: 10/23/17 11:36 PM  Result Value Ref Range Status   Specimen Description   Final    TRACHEAL  ASPIRATE Performed at [facility], [address], [place], [place] [address]    Special Requests   Final    NONE Performed at [facility], [address] [place], [place] [address]    Gram Stain   Final    RARE WBC PRESENT, PREDOMINANTLY PMN NO ORGANISMS SEEN Performed at [facility], [place] [address] [place], [place] [address]    Culture   Final    Consistent with normal respiratory flora. FUNGUS (MOLD) ISOLATED, PROBABLE CONTAMINANT/COLONIZER (SAPROPHYTE). CONTACT MICROBIOLOGY IF FURTHER IDENTIFICATION REQUIRED [phone].    Report Status 10/26/2017 FINAL  Final  Urine Culture     Status: Abnormal   Collection Time: 11/01/17  4:57 AM  Result Value Ref Range Status   Specimen Description   Final    URINE, CLEAN CATCH Performed at [facility], [address] [place], [place] [address]    Special Requests   Final    NONE Performed at [facility], [address] [place], [place] [address]  Culture (A)  Final    <10,000 COLONIES/mL INSIGNIFICANT GROWTH Performed at [place] [address], [place], [place] [address]    Report Status 11/02/2017 FINAL  Final  CULTURE, BLOOD (ROUTINE X 2) w Reflex to ID Panel     Status: None   Collection Time: 11/01/17  1:28 PM  Result Value Ref Range Status   Specimen Description BLOOD LINE  Final   Special Requests   Final    BOTTLES DRAWN AEROBIC AND ANAEROBIC Blood Culture adequate volume   Culture   Final    NO GROWTH 5 DAYS Performed at [facility], [address] [place], [place] [address]    Report Status 11/06/2017 FINAL  Final  CULTURE, BLOOD (ROUTINE X 2) w Reflex to ID Panel     Status: None   Collection Time: 11/01/17  1:51 PM  Result Value Ref Range Status   Specimen Description BLOOD R HAND  Final   Special Requests   Final    BOTTLES DRAWN AEROBIC AND ANAEROBIC Blood Culture results may not be optimal due to an inadequate volume of blood  received in culture bottles   Culture   Final    NO GROWTH 5 DAYS Performed at [facility], [address], [place], [place] [address]    Report Status 11/06/2017 FINAL  Final  Culture, respiratory (NON-Expectorated)     Status: None   Collection Time: 11/04/17 12:20 PM  Result Value Ref Range Status   Specimen Description   Final    SPUTUM Performed at [facility], [address], [place], [place] [address]    Special Requests   Final    NONE Performed at [facility], [address], [place], [place] [address]    Gram Stain   Final    ABUNDANT WBC PRESENT,BOTH PMN AND MONONUCLEAR RARE GRAM POSITIVE COCCI Performed at [facility], [place] [address], [place], [place] [address]    Culture   Final    RARE FUNGUS (MOLD) ISOLATED, PROBABLE CONTAMINANT/COLONIZER (SAPROPHYTE). CONTACT MICROBIOLOGY IF FURTHER IDENTIFICATION REQUIRED [phone].   Report Status 11/06/2017 FINAL  Final  Culture, respiratory (NON-Expectorated)     Status: None   Collection Time: 11/17/17  7:57 AM  Result Value Ref Range Status   Specimen Description   Final    TRACHEAL ASPIRATE Performed at [facility], [address] [place], [place] [address]    Special Requests   Final    NONE Performed at [facility], [address] [place], [place] [address]    Gram Stain   Final    MODERATE WBC PRESENT,BOTH PMN AND MONONUCLEAR MODERATE GRAM POSITIVE COCCI IN PAIRS RARE GRAM NEGATIVE RODS Performed at [facility], [place] [address], [place], [place] [address]    Culture ABUNDANT STAPHYLOCOCCUS AUREUS  Final   Report Status 11/19/2017 FINAL  Final   Organism ID, Bacteria STAPHYLOCOCCUS AUREUS  Final      Susceptibility   Staphylococcus aureus - MIC*    CIPROFLOXACIN <=0.5 SENSITIVE Sensitive     ERYTHROMYCIN <=0.25 SENSITIVE Sensitive     GENTAMICIN <=0.5 SENSITIVE Sensitive     OXACILLIN <=0.25 SENSITIVE Sensitive     TETRACYCLINE <=1  SENSITIVE Sensitive     VANCOMYCIN 1 SENSITIVE Sensitive     TRIMETH/SULFA <=10 SENSITIVE Sensitive     CLINDAMYCIN <=0.25 SENSITIVE Sensitive     RIFAMPIN <=0.5 SENSITIVE Sensitive     Inducible Clindamycin NEGATIVE Sensitive     *  ABUNDANT STAPHYLOCOCCUS AUREUS  CULTURE, BLOOD (ROUTINE X 2) w Reflex to ID Panel     Status: None   Collection Time: 11/17/17  9:09 AM  Result Value Ref Range Status   Specimen Description BLOOD LEFT HAND  Final   Special Requests   Final    BOTTLES DRAWN AEROBIC AND ANAEROBIC Blood Culture adequate volume   Culture   Final    NO GROWTH 5 DAYS Performed at [facility], [address], [place], [place] [address]    Report Status 11/22/2017 FINAL  Final  CULTURE, BLOOD (ROUTINE X 2) w Reflex to ID Panel     Status: Abnormal   Collection Time: 11/17/17 10:05 AM  Result Value Ref Range Status   Specimen Description   Final    BLOOD L HAND Performed at [facility], [address], [place], [place] [address]    Special Requests   Final    BOTTLES DRAWN AEROBIC AND ANAEROBIC Blood Culture adequate volume Performed at [facility], [address] [place], [place] [address]    Culture  Setup Time   Final    GRAM POSITIVE COCCI ANAEROBIC BOTTLE ONLY CRITICAL RESULT CALLED TO, READ BACK BY AND VERIFIED WITH: H. ZOMPA PHARMD, AT 1056 11/20/17 BY D. VANHOOK    Culture (A)  Final    MICROCOCCUS SPECIES Standardized susceptibility testing for this organism is not available. Performed at [facility], [place] [address], [place], [place] [address]    Report Status 11/21/2017 FINAL  Final    Vanc 06/16 >> 06/18 Cefepime 06/16 >> 06/18 Cefazolin 06/18 >>   CXR: No new film   IMP: Very severe COPD Prolonged mechanical ventilation Failure to wean- vent dependency Tracheostomy tube status MSSA HCAP/VAP 06/16  Severe sepsis with hypotension, resolved ICU acquired anemia G-tube status Anxiety/panic disorder Major  depression  Severe protein calorie malnutrition Anasarca Chronic anemia  PLAN/REC: Cont vent support - settings reviewed and/or adjusted Cont to wean in PSV <> ATC as tolerated Cont vent bundle Daily SBT if/when meets criteria  Continue nebulized steroids and bronchodilators Monitor temp, WBC count Micro and abx as above  DVT px: SQ heparin Monitor CBC intermittently Transfuse per usual guidelines  Cont scheduled clonazepam, quetiapine - dose reduced 06/18 Continue paroxetine per psychiatry Continue PRN alprazolam  Family: not available for update.  Cammie Sickle, MD PCCM service Pager [phone] 11/22/2017 8:23 AM

## 2017-11-22 NOTE — Progress Notes (Signed)
Nutrition Follow-up  DOCUMENTATION CODES:   Non-severe (moderate) malnutrition in context of chronic illness  INTERVENTION:   Jevity 1.5 Cal at 35 mL/hr (840 mL goal daily volume) + Pro-Stat 30 mL BID per G-tube. Provides 1460 kcal, 84 grams of protein, 18.5 grams of fiber, 638 mL H2O daily.  With current free water flush of 100 mL Q8hrs patient will receive a total of 938 mL H2O per day including water in tube feeding. Patient will also receive approximately 180 mL fluid with Pro-Stat provision.  Continue liquid MVI daily per tube.  NUTRITION DIAGNOSIS:   Moderate Malnutrition related to chronic illness(COPD, CHF, hepatitis C, cocaine abuse) as evidenced by mild fat depletion, moderate fat depletion, mild muscle depletion, moderate muscle depletion.  Ongoing - addressing with TF regimen.  GOAL:   Provide needs based on ASPEN/SCCM guidelines  Met with TF regimen.  MONITOR:   Vent status, Labs, Weight trends, TF tolerance, I & O's  ASSESSMENT:   57 year old female with PMHx of emphysema, COPD, HTN, hypercholesterolemia, hx pneumothorax s/p chest tube insertion, CAD s/p cardiac catheterization, cocaine abuse, tobacco abuse, asthma, CHF, anxiety, hepatitis C admitted with acute exacerbation of COPD, acute on chronic respiratory failure requiring intubation 4/29, PNA, cocaine intoxication, AKI due to renal hypoperfusion. Pt s/p trach and G-tube placement   Pt continues to be ventilated. Pt tolerating tube feeds well; continue with tube feed regimen at goal rate. Pt with significant weight gain since admit; pt noted to have severe edema in her arms and thighs. Pt -1.4L on I & O's. No Mg or P labs checked since 6/3; will recheck them today.   Medications reviewed and include: lovenox, pepcid, fentanyl, MVI, senokot, cefazolin, levophed   Labs reviewed: K 3.6 wnl, Cl 99(L), creat <0.3(L) Hgb 7.9(L), Hct 24.8(L) cbgs- 104, 118 x 24 hrs  Patient is currently intubated on ventilator  support MV: 9.5 L/min Temp (24hrs), Avg:98.1 F (36.7 C), Min:97 F (36.1 C), Max:99.5 F (37.5 C)  Propofol: none  MAP- >60mmHg  Access: 20 Fr. G-tube placed on 5/30 by IR  Diet Order:   Diet Order    None     EDUCATION NEEDS:   No education needs have been identified at this time  Skin:  Skin Assessment: Skin Integrity Issues: Skin Integrity Issues:: Stage II Stage II: sacrum (fully granulated) Incisions: N/A Other: device related pressure injury has healed now  Last BM:  6/21- type 4  Height:   Ht Readings from Last 1 Encounters:  10/01/17 4' 9" (1.448 m)    Weight:   Wt Readings from Last 1 Encounters:  11/22/17 152 lb 1.9 oz (69 kg)    Ideal Body Weight:  43.2 kg  BMI:  Body mass index is 32.92 kg/m.  Estimated Nutritional Needs:   Kcal:  1150-1370 (25-30 kcal/kg)  Protein:  70-93 grams (1.5-2 grams/kg)  Fluid:  1.4-1.6 L/day (30-35 mL/kg)    MS, RD, LDN Pager #- [phone] Office#- [phone] After Hours Pager: [phone]  

## 2017-11-22 NOTE — Progress Notes (Signed)
[facility] NAME: Michelle [name]    MR#:  [id number]  DATE OF BIRTH:  [birth year]  SUBJECTIVE:  Currently on tracheostomy with ventilator support    REVIEW OF SYSTEMS:    ROS Unable to obtain secondary to critical illness  DRUG ALLERGIES:   Allergies  Allergen Reactions  . Carvedilol Other (See Comments)    Pt gets "sick" when on medication    VITALS:  Blood pressure (!) 86/65, pulse (!) 106, temperature 99.1 F (37.3 C), resp. rate 19, height 4\' 9"  (1.448 m), weight 69 kg (152 lb 1.9 oz), last menstrual period 03/06/2005, SpO2 94 %.  PHYSICAL EXAMINATION:   Physical Exam  GENERAL:  57 y.o.-year-old patient lying in the bed with tracheostomy on ventilator support EYES: Pupils equal, round, reactive to light and accommodation. No scleral icterus. Extraocular muscles intact.  HEENT: Head atraumatic, normocephalic.  Oropharynx clear NECK:  Supple, no jugular venous distention. No thyroid enlargement, no tenderness.  Has tracheostomy with vent support FIO2 30 % Peep 5 Rate 18 TV 450 ml LUNGS: Decreased breath sounds bilaterally, rales heard in both lungs. No use of accessory muscles of respiration.  CARDIOVASCULAR: S1, S2 normal. No murmurs, rubs, or gallops.  ABDOMEN: Soft, nontender, nondistended. Bowel sounds present. No organomegaly or mass.  Peg tube present EXTREMITIES: No cyanosis, clubbing or edema b/l.    NEUROLOGIC: Not oriented to time, place and person PSYCHIATRIC: could not be assessed SKIN: Has decubitus ulcer  LABORATORY PANEL:   CBC Recent Labs  Lab 11/22/17 0416  WBC 6.5  HGB 7.9*  HCT 24.8*  PLT 345   ------------------------------------------------------------------------------------------------------------------ Chemistries  Recent Labs  Lab 11/17/17 0850  11/22/17 0416  NA 135   < > 141  K 3.6   < > 3.6  CL 96*   < > 99*  CO2 32   < > 34*  GLUCOSE 149*   < > 121*  BUN 20   < > 20   CREATININE <0.30*   < > <0.30*  CALCIUM 8.5*   < > 8.8*  MG  --   --  1.9  AST 25  --   --   ALT 15  --   --   ALKPHOS 63  --   --   BILITOT 0.4  --   --    < > = values in this interval not displayed.   ------------------------------------------------------------------------------------------------------------------  Cardiac Enzymes No results for input(s): TROPONINI in the last 168 hours. ------------------------------------------------------------------------------------------------------------------  RADIOLOGY:  Dg Chest Port 1 View  Result Date: 11/22/2017 CLINICAL DATA:  Acute respiratory failure EXAM: PORTABLE CHEST 1 VIEW COMPARISON:  11/19/2017 FINDINGS: Cardiac shadow is stable. Tracheostomy tube and right-sided PICC line are again seen and stable. Small bilateral pleural effusions are noted. Significant improved aeration is noted in the bases bilaterally. No acute bony abnormality is noted. Gastrostomy tube is noted in place. IMPRESSION: Significant improved aeration in the bases although some persistent effusion is noted. Tubes and lines as described above. Electronically Signed   By: Inez Catalina M.D.   On: 11/22/2017 07:19     ASSESSMENT AND PLAN:  57 year old female patient currently in ICU for respiratory failure, hypotension, pneumonia  -Acute on chronic hypoxic respiratory failure Status post tracheostomy on vent support Currently still on FiO2 of 30% Continue vent per intensivist   -Health Care Associated Pneumonia Tracheal aspirates : Staph Aureus On IV cefazolin abx   - Hypotension  intermittently hypotensive  Pressors as needed  -Critical illness polyneuropathy  -Acute on chronic COPD exacerbation Continue nebulization treatments  -Anxiety disorder anxiolytic medications  -Stage II decubitus ulcer Wound care protocol as per nursing staff   All the records are reviewed and case discussed with Care Management/Social Worker. Management plans  discussed with the patient, family and they are in agreement.  CODE STATUS: Partial  DVT Prophylaxis: SCDs  TOTAL TIME TAKING CARE OF THIS PATIENT: 56 minutes.   POSSIBLE D/C IN 3 to 4 DAYS, DEPENDING ON CLINICAL CONDITION.  Dustin Flock M.D on 11/22/2017 at 4:07 PM  Between 7am to 6pm - Pager - [phone]  After 6pm go to [email or web address] - password [email or web address] [facility]  Office  [phone]  CC: Primary care physician; Center, [facility]  Note: This dictation was prepared with Dragon dictation along with smaller phrase technology. Any transcriptional errors that result from this process are unintentional.

## 2017-11-22 NOTE — Progress Notes (Signed)
Pharmacy Antibiotic Note  Michelle [name] is a 57 y.o. female admitted on 09/30/2017 with staph PNA.  Pharmacy has been consulted for cefazolin dosing.  Plan: Continue cefazolin 1 gram q8h.   Height: 4\' 9"  (144.8 cm) Weight: 152 lb 1.9 oz (69 kg) IBW/kg (Calculated) : 38.6  Temp (24hrs), Avg:98.2 F (36.8 C), Min:97 F (36.1 C), Max:99.5 F (37.5 C)  Recent Labs  Lab 11/17/17 0850 11/18/17 0805 11/19/17 0911 11/20/17 0310 11/21/17 0748 11/22/17 0416  WBC 13.7* 16.5*  --  10.3  --  6.5  CREATININE <0.30* <0.30*  --   --   --  <0.30*  VANCOTROUGH  --   --  11*  --  <4*  --     CrCl cannot be calculated (This lab value cannot be used to calculate CrCl because it is not a number: <0.30).    Allergies  Allergen Reactions  . Carvedilol Other (See Comments)    Pt gets "sick" when on medication    Antimicrobials this admission: 4/29 levofloxacin >> 4/30 4/30 Zosyn >> 5/7 5/4 vancomycin >> 5/6 5/8 erythromycin >> 5/20 5/18 vancomycin >> 5/20 5/30 cefazolin >> 5/30 5/31 ceftriaxone >> 6/2 6/4 cefepime >>  6/4 6/16 vancomycin >> 6/18 6/16 cefepime >> 6/18 6/18 cefazolin >>    Microbiology results: 4/29 MRSA PCR: negative 4/29 UCx: no growth final 4/29 RespCx/tracheal aspirate: few consistent with normal flora 4/29 BCx: no growth final 5/4 RespCx/tracheal aspirate: few candidaa albicans, few candida dubliniensis 5/4 BCx: no growth final 5/17 BCx: MSRA 5/17 RespCX/tracheal aspirate: rare candida dubliniensis 5/18 BCx: no growth final 5/31 UCx: insignificant growth 5/31 BCx: no growth final 6/3 Sputum: rare gram positive cocci 6/16 BCx: no growth x2 days 6/16 RespCx/tracheal aspirate: abundant staph aureus, (MSSA)    Thank you for allowing pharmacy to be a part of this patient's care.   Simpson,Michael L 11/22/2017 4:43 PM

## 2017-11-23 LAB — GLUCOSE, CAPILLARY
Glucose-Capillary: 81 mg/dL (ref 65–99)
Glucose-Capillary: 96 mg/dL (ref 65–99)

## 2017-11-23 NOTE — Plan of Care (Signed)
Patient remains on vent.  Low grade fever.  Ibuprofen given with positive effects.  Patient signs and symptoms of distress.  No acute concerns this shift.  Will continue to monitor.

## 2017-11-23 NOTE — Progress Notes (Signed)
Responsive this morning.  Mouthing words.  Follows simple commands.  Remains on full ventilator support.  No distress.  Vitals:   11/23/17 1630 11/23/17 1700 11/23/17 1730 11/23/17 1800  BP: 95/62 106/86 112/69 101/72  Pulse: 83 85 87 85  Resp: 11 11 13 10   Temp:      TempSrc:      SpO2: 98% 99% 98% 98%  Weight:      Height:       NAD, RASS 0 HEENT: NCAT, sclerae white No JVD noted Diminished breath sounds, no wheezes RRR, no M Abdomen soft, NT, NABS BUE, BLE pitting edema Diffuse profound weakness, no focal deficits   BMP Latest Ref Rng & Units 11/22/2017 11/18/2017 11/17/2017  Glucose 65 - 99 mg/dL 121(H) 120(H) 149(H)  BUN 6 - 20 mg/dL 20 22(H) 20  Creatinine 0.44 - 1.00 mg/dL <0.30(L) <0.30(L) <0.30(L)  Sodium 135 - 145 mmol/L 141 139 135  Potassium 3.5 - 5.1 mmol/L 3.6 4.0 3.6  Chloride 101 - 111 mmol/L 99(L) 101 96(L)  CO2 22 - 32 mmol/L 34(H) 31 32  Calcium 8.9 - 10.3 mg/dL 8.8(L) 8.3(L) 8.5(L)    CBC Latest Ref Rng & Units 11/22/2017 11/20/2017 11/18/2017  WBC 3.6 - 11.0 K/uL 6.5 10.3 16.5(H)  Hemoglobin 12.0 - 16.0 g/dL 7.9(L) 7.7(L) 8.3(L)  Hematocrit 35.0 - 47.0 % 24.8(L) 24.6(L) 25.6(L)  Platelets 150 - 440 K/uL 345 280 311   Results for orders placed or performed during the hospital encounter of 09/30/17  CULTURE, BLOOD (ROUTINE X 2) w Reflex to ID Panel     Status: None   Collection Time: 09/30/17  5:43 PM  Result Value Ref Range Status   Specimen Description BLOOD CENTRAL LINE  Final   Special Requests   Final    BOTTLES DRAWN AEROBIC AND ANAEROBIC Blood Culture adequate volume   Culture   Final    NO GROWTH 5 DAYS Performed at [facility], [address] [place], [place] [address]    Report Status 10/05/2017 FINAL  Final  MRSA PCR Screening     Status: None   Collection Time: 09/30/17  8:02 PM  Result Value Ref Range Status   MRSA by PCR NEGATIVE NEGATIVE Final    Comment:        The GeneXpert MRSA Assay (FDA approved for NASAL  specimens only), is one component of a comprehensive MRSA colonization surveillance program. It is not intended to diagnose MRSA infection nor to guide or monitor treatment for MRSA infections. Performed at [facility], [address] [place], [place] [address]   Urine Culture     Status: None   Collection Time: 09/30/17  8:04 PM  Result Value Ref Range Status   Specimen Description   Final    URINE, RANDOM Performed at [facility], [address], [place], [place] [address]    Special Requests   Final    NONE Performed at [facility], [address] [place], [place] [address]    Culture   Final    NO GROWTH Performed at [facility], [place] [address], [place], [place] [address]    Report Status 10/02/2017 FINAL  Final  CULTURE, BLOOD (ROUTINE X 2) w Reflex to ID Panel     Status: None   Collection Time: 09/30/17  8:16 PM  Result Value Ref Range Status   Specimen Description BLOOD L HAND  Final   Special Requests   Final  BOTTLES DRAWN AEROBIC AND ANAEROBIC Blood Culture results may not be optimal due to an inadequate volume of blood received in culture bottles   Culture   Final    NO GROWTH 5 DAYS Performed at [facility], [address] [place], [place] [address]    Report Status 10/05/2017 FINAL  Final  Culture, respiratory (NON-Expectorated)     Status: None   Collection Time: 09/30/17  9:07 PM  Result Value Ref Range Status   Specimen Description   Final    TRACHEAL ASPIRATE Performed at [facility], [address] [place], [place] [address]    Special Requests   Final    NONE Performed at [facility], [address] [place], [place] [address]    Gram Stain   Final    MODERATE WBC PRESENT, PREDOMINANTLY PMN FEW GRAM POSITIVE COCCI IN PAIRS IN CLUSTERS FEW GRAM NEGATIVE RODS    Culture   Final    FEW Consistent with normal respiratory flora. Performed at [facility],  [place] [address], [place], [place] [address]    Report Status 10/03/2017 FINAL  Final  CULTURE, BLOOD (ROUTINE X 2) w Reflex to ID Panel     Status: None   Collection Time: 10/05/17 11:48 AM  Result Value Ref Range Status   Specimen Description BLOOD LAC  Final   Special Requests   Final    BOTTLES DRAWN AEROBIC AND ANAEROBIC Blood Culture adequate volume   Culture   Final    NO GROWTH 5 DAYS Performed at [facility], [address] [place], [place] [address]    Report Status 10/10/2017 FINAL  Final  CULTURE, BLOOD (ROUTINE X 2) w Reflex to ID Panel     Status: None   Collection Time: 10/05/17 11:55 AM  Result Value Ref Range Status   Specimen Description BLOOD LT HAND  Final   Special Requests   Final    BOTTLES DRAWN AEROBIC AND ANAEROBIC Blood Culture adequate volume   Culture   Final    NO GROWTH 5 DAYS Performed at [facility], [address], [place], [place] [address]    Report Status 10/10/2017 FINAL  Final  Culture, expectorated sputum-assessment     Status: None   Collection Time: 10/05/17  2:18 PM  Result Value Ref Range Status   Specimen Description TRACHEAL ASPIRATE  Final   Special Requests NONE  Final   Sputum evaluation   Final    THIS SPECIMEN IS ACCEPTABLE FOR SPUTUM CULTURE Performed at [facility], [address], [place], [place] [address]    Report Status 10/05/2017 FINAL  Final  Culture, respiratory (NON-Expectorated)     Status: None   Collection Time: 10/05/17  2:18 PM  Result Value Ref Range Status   Specimen Description   Final    TRACHEAL ASPIRATE Performed at [facility], [address] [place], [place] [address]    Special Requests   Final    NONE Reflexed from [id number] Performed at [facility], [address] [place], [place] [address]    Gram Stain   Final    MODERATE WBC PRESENT,BOTH PMN AND MONONUCLEAR NO SQUAMOUS EPITHELIAL CELLS SEEN FEW YEAST WITH PSEUDOHYPHAE RARE GRAM POSITIVE  COCCI IN CLUSTERS Performed at [facility], [place] [address], [place], [place] [address]    Culture FEW CANDIDA ALBICANS FEW CANDIDA DUBLINIENSIS   Final   Report Status 10/08/2017 FINAL  Final  CULTURE,  BLOOD (ROUTINE X 2) w Reflex to ID Panel     Status: Abnormal   Collection Time: 10/18/17 10:51 AM  Result Value Ref Range Status   Specimen Description   Final    BLOOD LEFT ANTECUBITAL Performed at [facility], [address] [place], [place] [address]    Special Requests   Final    BOTTLES DRAWN AEROBIC AND ANAEROBIC Blood Culture adequate volume Performed at [facility], [address] [place], [place] [address]    Culture  Setup Time   Final    GRAM POSITIVE COCCI ANAEROBIC BOTTLE ONLY CRITICAL RESULT CALLED TO, READ BACK BY AND VERIFIED WITH: MELISSA MACCIA 10/19/17 @ 9211  [place]    Culture (A)  Final    STAPHYLOCOCCUS SPECIES (COAGULASE NEGATIVE) THE SIGNIFICANCE OF ISOLATING THIS ORGANISM FROM A SINGLE SET OF BLOOD CULTURES WHEN MULTIPLE SETS ARE DRAWN IS UNCERTAIN. PLEASE NOTIFY THE MICROBIOLOGY DEPARTMENT WITHIN ONE WEEK IF SPECIATION AND SENSITIVITIES ARE REQUIRED. Performed at [facility], [place] [address] [place], [place] [address]    Report Status 10/21/2017 FINAL  Final  Blood Culture ID Panel (Reflexed)     Status: Abnormal   Collection Time: 10/18/17 10:51 AM  Result Value Ref Range Status   Enterococcus species NOT DETECTED NOT DETECTED Final   Listeria monocytogenes NOT DETECTED NOT DETECTED Final   Staphylococcus species DETECTED (A) NOT DETECTED Final    Comment: Methicillin (oxacillin) resistant coagulase negative staphylococcus. Possible blood culture contaminant (unless isolated from more than one blood culture draw or clinical case suggests pathogenicity). No antibiotic treatment is indicated for blood  culture contaminants. CRITICAL RESULT CALLED TO, READ BACK BY AND VERIFIED WITH: MELISSA Cade 10/19/17 @ 0814  [place]     Staphylococcus aureus NOT DETECTED NOT DETECTED Final   Methicillin resistance DETECTED (A) NOT DETECTED Final    Comment: CRITICAL RESULT CALLED TO, READ BACK BY AND VERIFIED WITH: MELISSA MACCIA 10/19/17 @ 4818  [place]    Streptococcus species NOT DETECTED NOT DETECTED Final   Streptococcus agalactiae NOT DETECTED NOT DETECTED Final   Streptococcus pneumoniae NOT DETECTED NOT DETECTED Final   Streptococcus pyogenes NOT DETECTED NOT DETECTED Final   Acinetobacter baumannii NOT DETECTED NOT DETECTED Final   Enterobacteriaceae species NOT DETECTED NOT DETECTED Final   Enterobacter cloacae complex NOT DETECTED NOT DETECTED Final   Escherichia coli NOT DETECTED NOT DETECTED Final   Klebsiella oxytoca NOT DETECTED NOT DETECTED Final   Klebsiella pneumoniae NOT DETECTED NOT DETECTED Final   Proteus species NOT DETECTED NOT DETECTED Final   Serratia marcescens NOT DETECTED NOT DETECTED Final   Haemophilus influenzae NOT DETECTED NOT DETECTED Final   Neisseria meningitidis NOT DETECTED NOT DETECTED Final   Pseudomonas aeruginosa NOT DETECTED NOT DETECTED Final   Candida albicans NOT DETECTED NOT DETECTED Final   Candida glabrata NOT DETECTED NOT DETECTED Final   Candida krusei NOT DETECTED NOT DETECTED Final   Candida parapsilosis NOT DETECTED NOT DETECTED Final   Candida tropicalis NOT DETECTED NOT DETECTED Final    Comment: Performed at [facility], [address] [place], [place] [address]  Culture, respiratory (NON-Expectorated)     Status: None   Collection Time: 10/18/17 11:09 AM  Result Value Ref Range Status   Specimen Description   Final    TRACHEAL ASPIRATE Performed at [facility], [address] [place],  [address]    Special Requests   Final    NONE Performed at [facility], [place], [place] [address]    Gram Stain   Final    MODERATE WBC PRESENT, PREDOMINANTLY PMN NO ORGANISMS SEEN Performed at [place] [address] [place], [place] [address]    Culture   Final    RARE CANDIDA DUBLINIENSIS RARE FUNGUS (MOLD) ISOLATED, PROBABLE CONTAMINANT/COLONIZER (SAPROPHYTE). CONTACT MICROBIOLOGY IF FURTHER IDENTIFICATION REQUIRED [phone].    Report Status 10/21/2017 FINAL  Final  CULTURE, BLOOD (ROUTINE X 2) w Reflex to ID Panel     Status: None   Collection Time: 10/18/17  4:09 PM  Result Value Ref Range Status   Specimen Description BLOOD L FA  Final   Special Requests   Final    BOTTLES DRAWN AEROBIC AND ANAEROBIC Blood Culture adequate volume   Culture   Final    NO GROWTH 5 DAYS Performed at [facility], [address] [place], [place] [address]    Report Status 10/23/2017 FINAL  Final  CULTURE, BLOOD (ROUTINE X 2) w Reflex to ID Panel     Status: None   Collection Time: 10/19/17  8:36 AM  Result Value Ref Range Status   Specimen Description BLOOD BLOOD LEFT HAND  Final   Special Requests   Final    BOTTLES DRAWN AEROBIC AND ANAEROBIC Blood Culture results may not be optimal due to an inadequate volume of blood received in culture bottles   Culture   Final    NO GROWTH 5 DAYS Performed at [facility], [address] [place], [place] [address]    Report Status 10/24/2017 FINAL  Final  CULTURE, BLOOD (ROUTINE X 2) w Reflex to ID Panel     Status: None   Collection Time: 10/19/17  8:52 AM  Result Value Ref Range Status   Specimen Description BLOOD A-LINE DRAW  Final   Special Requests   Final    BOTTLES DRAWN AEROBIC AND ANAEROBIC Blood Culture adequate volume   Culture   Final    NO GROWTH 5 DAYS Performed at [facility], [address] [place], [place] [address]    Report Status 10/24/2017 FINAL  Final  Culture, respiratory (NON-Expectorated)     Status: None   Collection Time: 10/23/17 11:36 PM  Result Value Ref Range Status   Specimen Description   Final    TRACHEAL ASPIRATE Performed at [facility], [address],  [place], [place] [address]    Special Requests   Final    NONE Performed at [facility], [address] [place], [place] [address]    Gram Stain   Final    RARE WBC PRESENT, PREDOMINANTLY PMN NO ORGANISMS SEEN Performed at [facility], [place] [address], [place], [place] [address]    Culture   Final    Consistent with normal respiratory flora. FUNGUS (MOLD) ISOLATED, PROBABLE CONTAMINANT/COLONIZER (SAPROPHYTE). CONTACT MICROBIOLOGY IF FURTHER IDENTIFICATION REQUIRED [phone].    Report Status 10/26/2017 FINAL  Final  Urine Culture     Status: Abnormal   Collection Time: 11/01/17  4:57 AM  Result Value Ref Range Status   Specimen Description   Final    URINE, CLEAN CATCH Performed at [facility], [address], [place],  [address]    Special Requests   Final    NONE Performed at [facility], [address] [place],  [address]    Culture (A)  Final    <10,000 COLONIES/mL INSIGNIFICANT GROWTH Performed at  [facility], [place] [address] [place], [place] [address]    Report Status 11/02/2017 FINAL  Final  CULTURE, BLOOD (ROUTINE X 2) w Reflex to ID Panel     Status: None   Collection Time: 11/01/17  1:28 PM  Result Value Ref Range Status   Specimen Description BLOOD LINE  Final   Special Requests   Final    BOTTLES DRAWN AEROBIC AND ANAEROBIC Blood Culture adequate volume   Culture   Final    NO GROWTH 5 DAYS Performed at [facility], [address] [place], [place] [address]    Report Status 11/06/2017 FINAL  Final  CULTURE, BLOOD (ROUTINE X 2) w Reflex to ID Panel     Status: None   Collection Time: 11/01/17  1:51 PM  Result Value Ref Range Status   Specimen Description BLOOD R HAND  Final   Special Requests   Final    BOTTLES DRAWN AEROBIC AND ANAEROBIC Blood Culture results may not be optimal due to an inadequate volume of blood received in culture bottles   Culture   Final    NO GROWTH 5  DAYS Performed at [facility], [address] [place], [place] [address]    Report Status 11/06/2017 FINAL  Final  Culture, respiratory (NON-Expectorated)     Status: None   Collection Time: 11/04/17 12:20 PM  Result Value Ref Range Status   Specimen Description   Final    SPUTUM Performed at [facility], [address] [place], [place] [address]    Special Requests   Final    NONE Performed at [facility], [address] [place], [place] [address]    Gram Stain   Final    ABUNDANT WBC PRESENT,BOTH PMN AND MONONUCLEAR RARE GRAM POSITIVE COCCI Performed at [facility], [place] [address] [place], [place] [address]    Culture   Final    RARE FUNGUS (MOLD) ISOLATED, PROBABLE CONTAMINANT/COLONIZER (SAPROPHYTE). CONTACT MICROBIOLOGY IF FURTHER IDENTIFICATION REQUIRED [phone].   Report Status 11/06/2017 FINAL  Final  Culture, respiratory (NON-Expectorated)     Status: None   Collection Time: 11/17/17  7:57 AM  Result Value Ref Range Status   Specimen Description   Final    TRACHEAL ASPIRATE Performed at [facility], [address], [place], [place] [address]    Special Requests   Final    NONE Performed at [facility], [address] [place], [place] [address]    Gram Stain   Final    MODERATE WBC PRESENT,BOTH PMN AND MONONUCLEAR MODERATE GRAM POSITIVE COCCI IN PAIRS RARE GRAM NEGATIVE RODS Performed at [facility], [place] [address], [place], [place] [address]    Culture ABUNDANT STAPHYLOCOCCUS AUREUS  Final   Report Status 11/19/2017 FINAL  Final   Organism ID, Bacteria STAPHYLOCOCCUS AUREUS  Final      Susceptibility   Staphylococcus aureus - MIC*    CIPROFLOXACIN <=0.5 SENSITIVE Sensitive     ERYTHROMYCIN <=0.25 SENSITIVE Sensitive     GENTAMICIN <=0.5 SENSITIVE Sensitive     OXACILLIN <=0.25 SENSITIVE Sensitive     TETRACYCLINE <=1 SENSITIVE Sensitive     VANCOMYCIN 1 SENSITIVE Sensitive      TRIMETH/SULFA <=10 SENSITIVE Sensitive     CLINDAMYCIN <=0.25 SENSITIVE Sensitive     RIFAMPIN <=0.5 SENSITIVE Sensitive     Inducible Clindamycin NEGATIVE Sensitive     * ABUNDANT STAPHYLOCOCCUS AUREUS  CULTURE, BLOOD (ROUTINE X 2)  w Reflex to ID Panel     Status: None   Collection Time: 11/17/17  9:09 AM  Result Value Ref Range Status   Specimen Description BLOOD LEFT HAND  Final   Special Requests   Final    BOTTLES DRAWN AEROBIC AND ANAEROBIC Blood Culture adequate volume   Culture   Final    NO GROWTH 5 DAYS Performed at [facility], [address], [place], [place] [address]    Report Status 11/22/2017 FINAL  Final  CULTURE, BLOOD (ROUTINE X 2) w Reflex to ID Panel     Status: Abnormal   Collection Time: 11/17/17 10:05 AM  Result Value Ref Range Status   Specimen Description   Final    BLOOD L HAND Performed at [facility], [address], [place], [place] [address]    Special Requests   Final    BOTTLES DRAWN AEROBIC AND ANAEROBIC Blood Culture adequate volume Performed at [facility], [address] [place], [place] [address]    Culture  Setup Time   Final    GRAM POSITIVE COCCI ANAEROBIC BOTTLE ONLY CRITICAL RESULT CALLED TO, READ BACK BY AND VERIFIED WITH: H. ZOMPA PHARMD, AT 1056 11/20/17 BY D. VANHOOK    Culture (A)  Final    MICROCOCCUS SPECIES Standardized susceptibility testing for this organism is not available. Performed at [facility], [place] [address], [place], [place] [address]    Report Status 11/21/2017 FINAL  Final    Vanc 06/16 >> 06/18 Cefepime 06/16 >> 06/18 Cefazolin 06/18 >>   CXR: No new film   IMP: Very severe COPD Prolonged mechanical ventilation Failure to wean- vent dependency Tracheostomy tube status MSSA HCAP/VAP 06/16  ICU acquired anemia G-tube status Anxiety/panic disorder Major depression  Severe protein calorie malnutrition Anasarca Chronic anemia  PLAN/REC: Cont vent support -  settings reviewed and/or adjusted Cont to wean in PSV <> ATC as tolerated Cont vent bundle Daily SBT if/when meets criteria  Continue nebulized steroids and bronchodilators Monitor temp, WBC count Micro and abx as above  DVT px: SQ heparin Monitor CBC intermittently Transfuse per usual guidelines  Cont scheduled clonazepam, quetiapine - dose reduced 06/18 Continue paroxetine per psychiatry Continue PRN alprazolam  Family: not available for update.  Cammie Sickle, MD PCCM service Pager [phone] 11/23/2017 6:54 PM

## 2017-11-23 NOTE — Progress Notes (Signed)
[place] [facility] [place] NAME: Michelle [name]    MR#:  [id number]  DATE OF BIRTH:  [birth year]  SUBJECTIVE:  Currently on tracheostomy with ventilator support REVIEW OF SYSTEMS:    ROS Unable to obtain secondary to critical illness  DRUG ALLERGIES:   Allergies  Allergen Reactions  . Carvedilol Other (See Comments)    Pt gets "sick" when on medication    VITALS:  Blood pressure 99/67, pulse 85, temperature 98.4 F (36.9 C), temperature source Oral, resp. rate 17, height 4\' 9"  (1.448 m), weight 64.7 kg (142 lb 10.2 oz), last menstrual period 03/06/2005, SpO2 97 %.  PHYSICAL EXAMINATION:   Physical Exam  GENERAL:  57 y.o.-year-old patient lying in the bed with tracheostomy on ventilator support EYES: Pupils equal, round, reactive to light and accommodation. No scleral icterus. Extraocular muscles intact.  HEENT: Head atraumatic, normocephalic.  Oropharynx clear NECK:  Supple, no jugular venous distention. No thyroid enlargement, no tenderness.  Has tracheostomy with vent support FIO2 30 % Peep 5 Rate 18 TV 450 ml LUNGS: Decreased breath sounds bilaterally, rales heard in both lungs. No use of accessory muscles of respiration.  CARDIOVASCULAR: S1, S2 normal. No murmurs, rubs, or gallops.  ABDOMEN: Soft, nontender, nondistended. Bowel sounds present. No organomegaly or mass.  Peg tube present EXTREMITIES: No cyanosis, clubbing or edema b/l.    NEUROLOGIC: Not oriented to time, place and person PSYCHIATRIC: could not be assessed SKIN: Has decubitus ulcer  LABORATORY PANEL:   CBC Recent Labs  Lab 11/22/17 0416  WBC 6.5  HGB 7.9*  HCT 24.8*  PLT 345   ------------------------------------------------------------------------------------------------------------------ Chemistries  Recent Labs  Lab 11/17/17 0850  11/22/17 0416  NA 135   < > 141  K 3.6   < > 3.6  CL 96*   < > 99*  CO2 32   < > 34*  GLUCOSE 149*   < > 121*  BUN  20   < > 20  CREATININE <0.30*   < > <0.30*  CALCIUM 8.5*   < > 8.8*  MG  --   --  1.9  AST 25  --   --   ALT 15  --   --   ALKPHOS 63  --   --   BILITOT 0.4  --   --    < > = values in this interval not displayed.   ------------------------------------------------------------------------------------------------------------------  Cardiac Enzymes No results for input(s): TROPONINI in the last 168 hours. ------------------------------------------------------------------------------------------------------------------  RADIOLOGY:  Dg Chest Port 1 View  Result Date: 11/22/2017 CLINICAL DATA:  Acute respiratory failure EXAM: PORTABLE CHEST 1 VIEW COMPARISON:  11/19/2017 FINDINGS: Cardiac shadow is stable. Tracheostomy tube and right-sided PICC line are again seen and stable. Small bilateral pleural effusions are noted. Significant improved aeration is noted in the bases bilaterally. No acute bony abnormality is noted. Gastrostomy tube is noted in place. IMPRESSION: Significant improved aeration in the bases although some persistent effusion is noted. Tubes and lines as described above. Electronically Signed   By: Inez Catalina M.D.   On: 11/22/2017 07:19     ASSESSMENT AND PLAN:  58 year old female patient currently in ICU for respiratory failure, hypotension, pneumonia  -Acute on chronic hypoxic respiratory failure Status post tracheostomy on vent support Currently still on FiO2 of 30% Continue vent per intensivist   -Health Care Associated Pneumonia Tracheal aspirates : Staph Aureus On IV cefazolin abx   - Hypotension  intermittently hypotensive Pressors as  needed  -Critical illness polyneuropathy  -Acute on chronic COPD exacerbation Continue nebulization treatments  -Anxiety disorder anxiolytic medications  -Stage II decubitus ulcer Wound care protocol as per nursing staff   All the records are reviewed and case discussed with Care Management/Social Worker. Management  plans discussed with the patient, family and they are in agreement.  CODE STATUS: Partial  DVT Prophylaxis: SCDs  TOTAL TIME TAKING CARE OF THIS PATIENT: 34 minutes.     Fritzi Mandes M.D on 11/23/2017 at 1:19 PM  Between 7am to 6pm - Pager - [phone]  After 6pm go to [email or web address] - password [email or web address] [place] [facility]  Office  [phone]  CC: Primary care physician; Center, [facility]  Note: This dictation was prepared with Dragon dictation along with smaller phrase technology. Any transcriptional errors that result from this process are unintentional.

## 2017-11-24 DIAGNOSIS — J449 Chronic obstructive pulmonary disease, unspecified: Secondary | ICD-10-CM

## 2017-11-24 LAB — CBC
HEMATOCRIT: 22.7 % — AB (ref 35.0–47.0)
Hemoglobin: 7.7 g/dL — ABNORMAL LOW (ref 12.0–16.0)
MCH: 29.1 pg (ref 26.0–34.0)
MCHC: 33.8 g/dL (ref 32.0–36.0)
MCV: 86.2 fL (ref 80.0–100.0)
Platelets: 363 10*3/uL (ref 150–440)
RBC: 2.63 MIL/uL — AB (ref 3.80–5.20)
RDW: 17.8 % — ABNORMAL HIGH (ref 11.5–14.5)
WBC: 9.5 10*3/uL (ref 3.6–11.0)

## 2017-11-24 LAB — PHOSPHORUS: Phosphorus: 3.5 mg/dL (ref 2.5–4.6)

## 2017-11-24 LAB — GLUCOSE, CAPILLARY
Glucose-Capillary: 112 mg/dL — ABNORMAL HIGH (ref 65–99)
Glucose-Capillary: 92 mg/dL (ref 65–99)
Glucose-Capillary: 99 mg/dL (ref 65–99)

## 2017-11-24 LAB — BASIC METABOLIC PANEL
Anion gap: 6 (ref 5–15)
BUN: 20 mg/dL (ref 6–20)
CHLORIDE: 100 mmol/L — AB (ref 101–111)
CO2: 35 mmol/L — AB (ref 22–32)
Calcium: 8.6 mg/dL — ABNORMAL LOW (ref 8.9–10.3)
Creatinine, Ser: <0.3 mg/dL — ABNORMAL LOW (ref 0.44–1.00)
GLUCOSE: 119 mg/dL — AB (ref 65–99)
POTASSIUM: 3.8 mmol/L (ref 3.5–5.1)
Sodium: 141 mmol/L (ref 135–145)

## 2017-11-24 LAB — MAGNESIUM: Magnesium: 1.9 mg/dL (ref 1.7–2.4)

## 2017-11-24 MED ORDER — POTASSIUM CHLORIDE 20 MEQ PO PACK
20.0000 meq | PACK | Freq: Two times a day (BID) | ORAL | Status: DC
  Administered 2017-11-24 – 2017-12-31 (×75): 20 meq
  Filled 2017-11-24 (×74): qty 1

## 2017-11-24 MED ORDER — FUROSEMIDE 10 MG/ML IJ SOLN
40.0000 mg | Freq: Once | INTRAMUSCULAR | Status: AC
  Administered 2017-11-24: 40 mg via INTRAVENOUS
  Filled 2017-11-24: qty 4

## 2017-11-24 MED ORDER — SODIUM CHLORIDE 0.9 % IV BOLUS
250.0000 mL | Freq: Once | INTRAVENOUS | Status: AC
  Administered 2017-11-24: 250 mL via INTRAVENOUS

## 2017-11-24 MED ORDER — MAGNESIUM SULFATE 2 GM/50ML IV SOLN
2.0000 g | Freq: Once | INTRAVENOUS | Status: AC
  Administered 2017-11-24: 2 g via INTRAVENOUS
  Filled 2017-11-24: qty 50

## 2017-11-24 MED ORDER — ALBUMIN HUMAN 25 % IV SOLN
12.5000 g | Freq: Once | INTRAVENOUS | Status: AC
  Administered 2017-11-24: 12.5 g via INTRAVENOUS
  Filled 2017-11-24: qty 50

## 2017-11-24 MED ORDER — FREE WATER
50.0000 mL | Freq: Three times a day (TID) | Status: DC
  Administered 2017-11-24 – 2017-12-31 (×109): 50 mL

## 2017-11-24 NOTE — Progress Notes (Signed)
Responsive this morning.  Mouthing words.  Follows simple commands.  Remains on full ventilator support.  No distress.  Vitals:   11/24/17 0730 11/24/17 0748 11/24/17 0800 11/24/17 0830  BP: 94/63  97/69 92/69  Pulse: 92  95 96  Resp: 17  19 17   Temp:  98.3 F (36.8 C)    TempSrc:  Oral    SpO2: 97%  96% 97%  Weight:      Height:       NAD, RASS 0 HEENT: NCAT, sclerae white No JVD noted Diminished breath sounds, no wheezes RRR, no M Abdomen soft, NT, NABS BUE, BLE pitting edema Diffuse profound weakness, no focal deficits   BMP Latest Ref Rng & Units 11/24/2017 11/22/2017 11/18/2017  Glucose 65 - 99 mg/dL 119(H) 121(H) 120(H)  BUN 6 - 20 mg/dL 20 20 22(H)  Creatinine 0.44 - 1.00 mg/dL <0.30(L) <0.30(L) <0.30(L)  Sodium 135 - 145 mmol/L 141 141 139  Potassium 3.5 - 5.1 mmol/L 3.8 3.6 4.0  Chloride 101 - 111 mmol/L 100(L) 99(L) 101  CO2 22 - 32 mmol/L 35(H) 34(H) 31  Calcium 8.9 - 10.3 mg/dL 8.6(L) 8.8(L) 8.3(L)    CBC Latest Ref Rng & Units 11/24/2017 11/22/2017 11/20/2017  WBC 3.6 - 11.0 K/uL 9.5 6.5 10.3  Hemoglobin 12.0 - 16.0 g/dL 7.7(L) 7.9(L) 7.7(L)  Hematocrit 35.0 - 47.0 % 22.7(L) 24.8(L) 24.6(L)  Platelets 150 - 440 K/uL 363 345 280   Results for orders placed or performed during the hospital encounter of 09/30/17  CULTURE, BLOOD (ROUTINE X 2) w Reflex to ID Panel     Status: None   Collection Time: 09/30/17  5:43 PM  Result Value Ref Range Status   Specimen Description BLOOD CENTRAL LINE  Final   Special Requests   Final    BOTTLES DRAWN AEROBIC AND ANAEROBIC Blood Culture adequate volume   Culture   Final    NO GROWTH 5 DAYS Performed at [facility], [address] [place], [place] [address]    Report Status 10/05/2017 FINAL  Final  MRSA PCR Screening     Status: None   Collection Time: 09/30/17  8:02 PM  Result Value Ref Range Status   MRSA by PCR NEGATIVE NEGATIVE Final    Comment:        The GeneXpert MRSA Assay (FDA approved for NASAL  specimens only), is one component of a comprehensive MRSA colonization surveillance program. It is not intended to diagnose MRSA infection nor to guide or monitor treatment for MRSA infections. Performed at [facility], [address], [place], [place] [address]   Urine Culture     Status: None   Collection Time: 09/30/17  8:04 PM  Result Value Ref Range Status   Specimen Description   Final    URINE, RANDOM Performed at [facility], [address] [place], [place] [address]    Special Requests   Final    NONE Performed at [facility], [address], [place], [place] [address]    Culture   Final    NO GROWTH Performed at [facility], [place] [address], [place], [place] [address]    Report Status 10/02/2017 FINAL  Final  CULTURE, BLOOD (ROUTINE X 2) w Reflex to ID Panel     Status: None   Collection Time: 09/30/17  8:16 PM  Result Value Ref Range Status   Specimen Description BLOOD L HAND  Final   Special Requests   Final  BOTTLES DRAWN AEROBIC AND ANAEROBIC Blood Culture results may not be optimal due to an inadequate volume of blood received in culture bottles   Culture   Final    NO GROWTH 5 DAYS Performed at [facility], [address] [place], [place] [address]    Report Status 10/05/2017 FINAL  Final  Culture, respiratory (NON-Expectorated)     Status: None   Collection Time: 09/30/17  9:07 PM  Result Value Ref Range Status   Specimen Description   Final    TRACHEAL ASPIRATE Performed at [facility], [address] [place], [place] [address]    Special Requests   Final    NONE Performed at [facility], [address] [place], [place] [address]    Gram Stain   Final    MODERATE WBC PRESENT, PREDOMINANTLY PMN FEW GRAM POSITIVE COCCI IN PAIRS IN CLUSTERS FEW GRAM NEGATIVE RODS    Culture   Final    FEW Consistent with normal respiratory flora. Performed at [facility],  [place] [address] [place], [place] [address]    Report Status 10/03/2017 FINAL  Final  CULTURE, BLOOD (ROUTINE X 2) w Reflex to ID Panel     Status: None   Collection Time: 10/05/17 11:48 AM  Result Value Ref Range Status   Specimen Description BLOOD LAC  Final   Special Requests   Final    BOTTLES DRAWN AEROBIC AND ANAEROBIC Blood Culture adequate volume   Culture   Final    NO GROWTH 5 DAYS Performed at [facility], [address] [place], [place] [address]    Report Status 10/10/2017 FINAL  Final  CULTURE, BLOOD (ROUTINE X 2) w Reflex to ID Panel     Status: None   Collection Time: 10/05/17 11:55 AM  Result Value Ref Range Status   Specimen Description BLOOD LT HAND  Final   Special Requests   Final    BOTTLES DRAWN AEROBIC AND ANAEROBIC Blood Culture adequate volume   Culture   Final    NO GROWTH 5 DAYS Performed at [facility], [address] [place], [place] [address]    Report Status 10/10/2017 FINAL  Final  Culture, expectorated sputum-assessment     Status: None   Collection Time: 10/05/17  2:18 PM  Result Value Ref Range Status   Specimen Description TRACHEAL ASPIRATE  Final   Special Requests NONE  Final   Sputum evaluation   Final    THIS SPECIMEN IS ACCEPTABLE FOR SPUTUM CULTURE Performed at [facility], [address] [place], [place] [address]    Report Status 10/05/2017 FINAL  Final  Culture, respiratory (NON-Expectorated)     Status: None   Collection Time: 10/05/17  2:18 PM  Result Value Ref Range Status   Specimen Description   Final    TRACHEAL ASPIRATE Performed at [facility], [address] [place], [place] [address]    Special Requests   Final    NONE Reflexed from [id number] Performed at [facility], [address] [place], [place] [address]    Gram Stain   Final    MODERATE WBC PRESENT,BOTH PMN AND MONONUCLEAR NO SQUAMOUS EPITHELIAL CELLS SEEN FEW YEAST WITH PSEUDOHYPHAE RARE GRAM POSITIVE  COCCI IN CLUSTERS Performed at [facility], [place] [address], [place], [place] [address]    Culture FEW CANDIDA ALBICANS FEW CANDIDA DUBLINIENSIS   Final   Report Status 10/08/2017 FINAL  Final  CULTURE,  BLOOD (ROUTINE X 2) w Reflex to ID Panel     Status: Abnormal   Collection Time: 10/18/17 10:51 AM  Result Value Ref Range Status   Specimen Description   Final    BLOOD LEFT ANTECUBITAL Performed at [facility], [address], [place], [place] [address]    Special Requests   Final    BOTTLES DRAWN AEROBIC AND ANAEROBIC Blood Culture adequate volume Performed at [facility], [address] [place], [place] [address]    Culture  Setup Time   Final    GRAM POSITIVE COCCI ANAEROBIC BOTTLE ONLY CRITICAL RESULT CALLED TO, READ BACK BY AND VERIFIED WITH: MELISSA MACCIA 10/19/17 @ 0867  [place]    Culture (A)  Final    STAPHYLOCOCCUS SPECIES (COAGULASE NEGATIVE) THE SIGNIFICANCE OF ISOLATING THIS ORGANISM FROM A SINGLE SET OF BLOOD CULTURES WHEN MULTIPLE SETS ARE DRAWN IS UNCERTAIN. PLEASE NOTIFY THE MICROBIOLOGY DEPARTMENT WITHIN ONE WEEK IF SPECIATION AND SENSITIVITIES ARE REQUIRED. Performed at [facility], [place] [address], [place], [place] [address]    Report Status 10/21/2017 FINAL  Final  Blood Culture ID Panel (Reflexed)     Status: Abnormal   Collection Time: 10/18/17 10:51 AM  Result Value Ref Range Status   Enterococcus species NOT DETECTED NOT DETECTED Final   Listeria monocytogenes NOT DETECTED NOT DETECTED Final   Staphylococcus species DETECTED (A) NOT DETECTED Final    Comment: Methicillin (oxacillin) resistant coagulase negative staphylococcus. Possible blood culture contaminant (unless isolated from more than one blood culture draw or clinical case suggests pathogenicity). No antibiotic treatment is indicated for blood  culture contaminants. CRITICAL RESULT CALLED TO, READ BACK BY AND VERIFIED WITH: MELISSA Holt 10/19/17 @ 9326  [place]     Staphylococcus aureus NOT DETECTED NOT DETECTED Final   Methicillin resistance DETECTED (A) NOT DETECTED Final    Comment: CRITICAL RESULT CALLED TO, READ BACK BY AND VERIFIED WITH: MELISSA MACCIA 10/19/17 @ 7124  [place]    Streptococcus species NOT DETECTED NOT DETECTED Final   Streptococcus agalactiae NOT DETECTED NOT DETECTED Final   Streptococcus pneumoniae NOT DETECTED NOT DETECTED Final   Streptococcus pyogenes NOT DETECTED NOT DETECTED Final   Acinetobacter baumannii NOT DETECTED NOT DETECTED Final   Enterobacteriaceae species NOT DETECTED NOT DETECTED Final   Enterobacter cloacae complex NOT DETECTED NOT DETECTED Final   Escherichia coli NOT DETECTED NOT DETECTED Final   Klebsiella oxytoca NOT DETECTED NOT DETECTED Final   Klebsiella pneumoniae NOT DETECTED NOT DETECTED Final   Proteus species NOT DETECTED NOT DETECTED Final   Serratia marcescens NOT DETECTED NOT DETECTED Final   Haemophilus influenzae NOT DETECTED NOT DETECTED Final   Neisseria meningitidis NOT DETECTED NOT DETECTED Final   Pseudomonas aeruginosa NOT DETECTED NOT DETECTED Final   Candida albicans NOT DETECTED NOT DETECTED Final   Candida glabrata NOT DETECTED NOT DETECTED Final   Candida krusei NOT DETECTED NOT DETECTED Final   Candida parapsilosis NOT DETECTED NOT DETECTED Final   Candida tropicalis NOT DETECTED NOT DETECTED Final    Comment: Performed at [facility], [address] [place], [place] [address]  Culture, respiratory (NON-Expectorated)     Status: None   Collection Time: 10/18/17 11:09 AM  Result Value Ref Range Status   Specimen Description   Final    TRACHEAL ASPIRATE Performed at [facility], [address], [place], [place] [address]    Special Requests   Final    NONE Performed at [facility], [place], [place] [address]    Gram Stain   Final    MODERATE WBC PRESENT, PREDOMINANTLY PMN NO ORGANISMS SEEN Performed at [place] [address] [place], [place] [address]    Culture   Final    RARE CANDIDA DUBLINIENSIS RARE FUNGUS (MOLD) ISOLATED, PROBABLE CONTAMINANT/COLONIZER (SAPROPHYTE). CONTACT MICROBIOLOGY IF FURTHER IDENTIFICATION REQUIRED [phone].    Report Status 10/21/2017 FINAL  Final  CULTURE, BLOOD (ROUTINE X 2) w Reflex to ID Panel     Status: None   Collection Time: 10/18/17  4:09 PM  Result Value Ref Range Status   Specimen Description BLOOD L FA  Final   Special Requests   Final    BOTTLES DRAWN AEROBIC AND ANAEROBIC Blood Culture adequate volume   Culture   Final    NO GROWTH 5 DAYS Performed at [facility], [address] [place], [place] [address]    Report Status 10/23/2017 FINAL  Final  CULTURE, BLOOD (ROUTINE X 2) w Reflex to ID Panel     Status: None   Collection Time: 10/19/17  8:36 AM  Result Value Ref Range Status   Specimen Description BLOOD BLOOD LEFT HAND  Final   Special Requests   Final    BOTTLES DRAWN AEROBIC AND ANAEROBIC Blood Culture results may not be optimal due to an inadequate volume of blood received in culture bottles   Culture   Final    NO GROWTH 5 DAYS Performed at [facility], [address] [place], [place] [address]    Report Status 10/24/2017 FINAL  Final  CULTURE, BLOOD (ROUTINE X 2) w Reflex to ID Panel     Status: None   Collection Time: 10/19/17  8:52 AM  Result Value Ref Range Status   Specimen Description BLOOD A-LINE DRAW  Final   Special Requests   Final    BOTTLES DRAWN AEROBIC AND ANAEROBIC Blood Culture adequate volume   Culture   Final    NO GROWTH 5 DAYS Performed at [facility], [address] [place], [place] [address]    Report Status 10/24/2017 FINAL  Final  Culture, respiratory (NON-Expectorated)     Status: None   Collection Time: 10/23/17 11:36 PM  Result Value Ref Range Status   Specimen Description   Final    TRACHEAL ASPIRATE Performed at [facility], [address],  [place], [place] [address]    Special Requests   Final    NONE Performed at [facility], [address] [place], [place] [address]    Gram Stain   Final    RARE WBC PRESENT, PREDOMINANTLY PMN NO ORGANISMS SEEN Performed at [facility], [place] [address], [place], [place] [address]    Culture   Final    Consistent with normal respiratory flora. FUNGUS (MOLD) ISOLATED, PROBABLE CONTAMINANT/COLONIZER (SAPROPHYTE). CONTACT MICROBIOLOGY IF FURTHER IDENTIFICATION REQUIRED [phone].    Report Status 10/26/2017 FINAL  Final  Urine Culture     Status: Abnormal   Collection Time: 11/01/17  4:57 AM  Result Value Ref Range Status   Specimen Description   Final    URINE, CLEAN CATCH Performed at [facility], [address], [place], [place] [address]    Special Requests   Final    NONE Performed at [facility], [address] [place],  [address]    Culture (A)  Final    <10,000 COLONIES/mL INSIGNIFICANT GROWTH Performed at  [facility], [place] [address], [place], [place] [address]    Report Status 11/02/2017 FINAL  Final  CULTURE, BLOOD (ROUTINE X 2) w Reflex to ID Panel     Status: None   Collection Time: 11/01/17  1:28 PM  Result Value Ref Range Status   Specimen Description BLOOD LINE  Final   Special Requests   Final    BOTTLES DRAWN AEROBIC AND ANAEROBIC Blood Culture adequate volume   Culture   Final    NO GROWTH 5 DAYS Performed at [facility], [address] [place], [place] [address]    Report Status 11/06/2017 FINAL  Final  CULTURE, BLOOD (ROUTINE X 2) w Reflex to ID Panel     Status: None   Collection Time: 11/01/17  1:51 PM  Result Value Ref Range Status   Specimen Description BLOOD R HAND  Final   Special Requests   Final    BOTTLES DRAWN AEROBIC AND ANAEROBIC Blood Culture results may not be optimal due to an inadequate volume of blood received in culture bottles   Culture   Final    NO GROWTH 5  DAYS Performed at [facility], [address] [place], [place] [address]    Report Status 11/06/2017 FINAL  Final  Culture, respiratory (NON-Expectorated)     Status: None   Collection Time: 11/04/17 12:20 PM  Result Value Ref Range Status   Specimen Description   Final    SPUTUM Performed at [facility], [address] [place], [place] [address]    Special Requests   Final    NONE Performed at [facility], [address] [place], [place] [address]    Gram Stain   Final    ABUNDANT WBC PRESENT,BOTH PMN AND MONONUCLEAR RARE GRAM POSITIVE COCCI Performed at [facility], [place] [address] [place], [place] [address]    Culture   Final    RARE FUNGUS (MOLD) ISOLATED, PROBABLE CONTAMINANT/COLONIZER (SAPROPHYTE). CONTACT MICROBIOLOGY IF FURTHER IDENTIFICATION REQUIRED [phone].   Report Status 11/06/2017 FINAL  Final  Culture, respiratory (NON-Expectorated)     Status: None   Collection Time: 11/17/17  7:57 AM  Result Value Ref Range Status   Specimen Description   Final    TRACHEAL ASPIRATE Performed at [facility], [address] [place], [place] [address]    Special Requests   Final    NONE Performed at [facility], [address] [place], [place] [address]    Gram Stain   Final    MODERATE WBC PRESENT,BOTH PMN AND MONONUCLEAR MODERATE GRAM POSITIVE COCCI IN PAIRS RARE GRAM NEGATIVE RODS Performed at [facility], [place] [address], [place], [place] [address]    Culture ABUNDANT STAPHYLOCOCCUS AUREUS  Final   Report Status 11/19/2017 FINAL  Final   Organism ID, Bacteria STAPHYLOCOCCUS AUREUS  Final      Susceptibility   Staphylococcus aureus - MIC*    CIPROFLOXACIN <=0.5 SENSITIVE Sensitive     ERYTHROMYCIN <=0.25 SENSITIVE Sensitive     GENTAMICIN <=0.5 SENSITIVE Sensitive     OXACILLIN <=0.25 SENSITIVE Sensitive     TETRACYCLINE <=1 SENSITIVE Sensitive     VANCOMYCIN 1 SENSITIVE Sensitive      TRIMETH/SULFA <=10 SENSITIVE Sensitive     CLINDAMYCIN <=0.25 SENSITIVE Sensitive     RIFAMPIN <=0.5 SENSITIVE Sensitive     Inducible Clindamycin NEGATIVE Sensitive     * ABUNDANT STAPHYLOCOCCUS AUREUS  CULTURE, BLOOD (ROUTINE X 2)  w Reflex to ID Panel     Status: None   Collection Time: 11/17/17  9:09 AM  Result Value Ref Range Status   Specimen Description BLOOD LEFT HAND  Final   Special Requests   Final    BOTTLES DRAWN AEROBIC AND ANAEROBIC Blood Culture adequate volume   Culture   Final    NO GROWTH 5 DAYS Performed at [facility], [address] [place], [place] [address]    Report Status 11/22/2017 FINAL  Final  CULTURE, BLOOD (ROUTINE X 2) w Reflex to ID Panel     Status: Abnormal   Collection Time: 11/17/17 10:05 AM  Result Value Ref Range Status   Specimen Description   Final    BLOOD L HAND Performed at [facility], [address] [place], [place] [address]    Special Requests   Final    BOTTLES DRAWN AEROBIC AND ANAEROBIC Blood Culture adequate volume Performed at [facility], [address] [place], [place] [address]    Culture  Setup Time   Final    GRAM POSITIVE COCCI ANAEROBIC BOTTLE ONLY CRITICAL RESULT CALLED TO, READ BACK BY AND VERIFIED WITH: H. ZOMPA PHARMD, AT 1056 11/20/17 BY D. VANHOOK    Culture (A)  Final    MICROCOCCUS SPECIES Standardized susceptibility testing for this organism is not available. Performed at [facility], [place] [address] [place], [place] [address]    Report Status 11/21/2017 FINAL  Final    Vanc 06/16 >> 06/18 Cefepime 06/16 >> 06/18 Cefazolin 06/18 >>   CXR: No new film   IMP: Very severe COPD Prolonged mechanical ventilation Failure to wean- vent dependency Tracheostomy tube status MSSA HCAP/VAP 06/16  ICU acquired anemia G-tube status Anxiety/panic disorder Major depression  Severe protein calorie malnutrition Anasarca Chronic anemia Pyrexia- will repeat  cultures  PLAN/REC: Cont to wean in PSV <> ATC as tolerated Cont vent bundle Will give a dose of Albumin and Lasix today Daily SBT as per pt tolerance and per protocol Continue nebulized steroids and bronchodilators Monitor temp, WBC count Micro and abx as above  DVT px: SQ heparin Monitor CBC intermittently Transfuse per usual guidelines  Cont scheduled clonazepam, quetiapine - dose reduced 06/18 Continue paroxetine per psychiatry Continue PRN alprazolam  Family: not available for update.  Cammie Sickle, MD PCCM service Pager [phone] 11/24/2017 9:52 AM

## 2017-11-24 NOTE — Progress Notes (Signed)
[place] [facility] [place] NAME: Michelle [name]    MR#:  [id number]  DATE OF BIRTH:  1960-07-01  SUBJECTIVE:  Currently on tracheostomy with ventilator support REVIEW OF SYSTEMS:    ROS Unable to obtain secondary to critical illness  DRUG ALLERGIES:   Allergies  Allergen Reactions  . Carvedilol Other (See Comments)    Pt gets "sick" when on medication    VITALS:  Blood pressure (!) 89/61, pulse 100, temperature 98.3 F (36.8 C), temperature source Oral, resp. rate (!) 22, height 4\' 9"  (1.448 m), weight 67.3 kg (148 lb 5.9 oz), last menstrual period 03/06/2005, SpO2 95 %.  PHYSICAL EXAMINATION:   Physical Exam  GENERAL:  57 y.o.-year-old patient lying in the bed with tracheostomy on ventilator support EYES: Pupils equal, round, reactive to light and accommodation. No scleral icterus. Extraocular muscles intact.  HEENT: Head atraumatic, normocephalic.  Oropharynx clear NECK:  Supple, no jugular venous distention. No thyroid enlargement, no tenderness.  Has tracheostomy with vent support FIO2 30 % Peep 5 Rate 18 TV 450 ml LUNGS: Decreased breath sounds bilaterally, rales heard in both lungs. No use of accessory muscles of respiration.  CARDIOVASCULAR: S1, S2 normal. No murmurs, rubs, or gallops.  ABDOMEN: Soft, nontender, nondistended. Bowel sounds present. No organomegaly or mass.  Peg tube present EXTREMITIES: No cyanosis, clubbing or edema b/l.    NEUROLOGIC: Not oriented to time, place and person PSYCHIATRIC: could not be assessed SKIN: Has decubitus ulcer  LABORATORY PANEL:   CBC Recent Labs  Lab 11/24/17 0644  WBC 9.5  HGB 7.7*  HCT 22.7*  PLT 363   ------------------------------------------------------------------------------------------------------------------ Chemistries  Recent Labs  Lab 11/24/17 0644  NA 141  K 3.8  CL 100*  CO2 35*  GLUCOSE 119*  BUN 20  CREATININE <0.30*  CALCIUM 8.6*  MG 1.9    ------------------------------------------------------------------------------------------------------------------  Cardiac Enzymes No results for input(s): TROPONINI in the last 168 hours. ------------------------------------------------------------------------------------------------------------------  RADIOLOGY:  No results found.   ASSESSMENT AND PLAN:  57 year old female patient currently in ICU for respiratory failure, hypotension, pneumonia  -Acute on chronic hypoxic respiratory failure Status post tracheostomy on vent support Currently still on FiO2 of 30% Continue vent per intensivist   -[place] Pneumonia Tracheal aspirates : Staph Aureus On IV cefazolin abx   - Hypotension  intermittently hypotensive Pressors as needed  -Critical illness polyneuropathy  -Acute on chronic COPD exacerbation Continue nebulization treatments  -Anxiety disorder anxiolytic medications  -Stage II decubitus ulcer Wound care protocol as per nursing staff  CM for d/c planning.    All the records are reviewed and case discussed with Care Management/Social Worker. Management plans discussed with the patient, family and they are in agreement.  CODE STATUS: Partial  DVT Prophylaxis: SCDs  TOTAL TIME TAKING CARE OF THIS PATIENT: 34 minutes.     Fritzi Mandes M.D on 11/24/2017 at 11:52 AM  Between 7am to 6pm - Pager - [phone]  After 6pm go to [email or web address] - password [email or web address] [facility]  Office  [phone]  CC: Primary care physician; Center, [facility]  Note: This dictation was prepared with Dragon dictation along with smaller phrase technology. Any transcriptional errors that result from this process are unintentional.

## 2017-11-25 LAB — GLUCOSE, CAPILLARY
GLUCOSE-CAPILLARY: 92 mg/dL (ref 65–99)
GLUCOSE-CAPILLARY: 94 mg/dL (ref 65–99)
GLUCOSE-CAPILLARY: 111 mg/dL — AB (ref 65–99)
GLUCOSE-CAPILLARY: 100 mg/dL — AB (ref 65–99)

## 2017-11-25 NOTE — Clinical Social Work Note (Signed)
CSW attended family meeting with nursing team today. Family now agreeable to vent SNF, but not out of state to include [place] or [place] facilities. Family made aware that if no [place] facilities are able to accept patient, patient to discharge home.  CSW staffed with RNCM Levada Dy and [place]. RNCM Angela informed by Marzetta Board at [facility] in [place] no beds available. CSW spoke with Santiago Glad at [facility] in [place], who stated they are full, but patient can be placed on waiting list. CSW also left a voicemail for [facility] in [place]. Awaiting call back. CSW continuing to follow for discharge needs.   Oretha Ellis, MSW, [facility] Clinical Social Worker [phone]

## 2017-11-25 NOTE — Evaluation (Signed)
Physical Therapy Re-Evaluation Patient Details Name: Michelle [name] MRN: [id number] DOB: 1960/09/29 Today's Date: 11/25/2017   History of Present Illness  Pt is a 57 y/o F who presented with severe SOB with COPD exacerbation.  Chest x-ray was clear, patient was slightly hypotensive after intubation, admitted to the ICU.  Trach tube placed 5/16.  Pt demonstrated seizure like activity on 5/23, MRI negative, EEG abnormal but no epileptiform activity.  Pt's PMH includes cocaine abuse, COPD, CAD, hepatitis C, CHF.      Clinical Impression  Pt is a pleasant 57 year old female who was admitted for anxiety disorder and a COPD exacerbation. Pt unable to perform bed mobility, transfers, and ambulation due to gross deconditioning. Pt demonstrates deficits with strength, ROM, mobility, deconditioning. Pt is able to follow commands to open eyes, squeeze hands, and attempt to initiate movements. Only movements displayed were L plantarflexion initiation and triceps initiation bilaterally assisted by gravity. Pts LEs stiff when moved passively including DF to less than neutral on L. At this time pt could benefit from skilled therapy to see if she can participate. Pt could benefit from caregiver education from PT on positioning and PROM. PT attempted previously however was d/c due to pts limited progress, PT reevaluation performed on this date. Pt will be given a 3 session trial of PT to see if she is appropriate and is able to show improvements warranting skilled therapy.     Follow Up Recommendations SNF    Equipment Recommendations       Recommendations for Other Services       Precautions / Restrictions Precautions Precautions: Fall;Other (comment) Precaution Comments: trach, NG tube, PICC line Restrictions Weight Bearing Restrictions: No      Mobility  Bed Mobility Overal bed mobility: Needs Assistance             General bed mobility comments: Not formally assessed due to pts level of  alertness. Due to pts level of gross deconditioning pt would likely be total assist for bed mobility  Transfers                 General transfer comment: Not formally assessed due to pts level of alertness. Due to pts level of gross deconditioning pt would likely be total assist for transfers  Ambulation/Gait                Stairs            Wheelchair Mobility    Modified Rankin (Stroke Patients Only)       Balance                                             Pertinent Vitals/Pain Pain Assessment: No/denies pain Pain Location: pt denies pain with PROM during session    [place] expects to be discharged to:: Skilled nursing facility Living Arrangements: Spouse/significant other               Additional Comments: Pt on trach and remains very lethargic, deferred home living and PLOF information until pt able to provide information.     Prior Function Level of Independence: Needs assistance   Gait / Transfers Assistance Needed: Pt reports independent w/ transfers but very limited in w/ community distances due to SOB.  ADL's / Homemaking Assistance Needed: Pt reports daughters assist w/ ADLs, grocery shopping, and house  cleaning        Hand Dominance        Extremity/Trunk Assessment   Upper Extremity Assessment RUE Deficits / Details: Pt able to initiate elbow extension once passively flexed however motion greatly assisted by gravity LUE Deficits / Details: Pt able to initiate elbow extension once passively flexed however motion greatly assisted by gravity    Lower Extremity Assessment RLE Deficits / Details: No movement appreciated. DF passively to neutral, stiff passive hip flexion and knee flexion. Able to iniate toe movement on command LLE Deficits / Details: Trace amounts of active plantar flexion. DF passively less than neutral, stiff passive hip flexion and knee flexion. Able to move toes on  command.       Communication   Communication: Tracheostomy  Cognition Arousal/Alertness: Lethargic Behavior During Therapy: Flat affect Overall Cognitive Status: Difficult to assess                                 General Comments: Pt responds little. Very lethargic/falling asleep      General Comments General comments (skin integrity, edema, etc.): pt displays pitting edema in B UE    Exercises     Assessment/Plan    PT Assessment Patient needs continued PT services  PT Problem List Decreased strength;Decreased range of motion;Decreased activity tolerance;Decreased balance;Decreased mobility;Decreased coordination;Decreased knowledge of use of DME;Decreased safety awareness;Decreased knowledge of precautions;Cardiopulmonary status limiting activity       PT Treatment Interventions DME instruction;Gait training;Stair training;Functional mobility training;Therapeutic activities;Therapeutic exercise;Balance training;Neuromuscular re-education;Patient/family education;Manual techniques;Wheelchair mobility training;Modalities;Cognitive remediation    PT Goals (Current goals can be found in the Care Plan section)  Acute Rehab PT Goals Patient Stated Goal: unable to state PT Goal Formulation: Patient unable to participate in goal setting Time For Goal Achievement: 12/09/17 Potential to Achieve Goals: Poor    Frequency Min 2X/week   Barriers to discharge        Co-evaluation               AM-PAC PT "6 Clicks" Daily Activity  Outcome Measure Difficulty turning over in bed (including adjusting bedclothes, sheets and blankets)?: Unable Difficulty moving from lying on back to sitting on the side of the bed? : Unable Difficulty sitting down on and standing up from a chair with arms (e.g., wheelchair, bedside commode, etc,.)?: Unable Help needed moving to and from a bed to chair (including a wheelchair)?: Total Help needed walking in hospital room?:  Total Help needed climbing 3-5 steps with a railing? : Total 6 Click Score: 6    End of Session   Activity Tolerance: Patient limited by lethargy Patient left: in bed;with call bell/phone within reach;with bed alarm set   PT Visit Diagnosis: Muscle weakness (generalized) (M62.81);Difficulty in walking, not elsewhere classified (R26.2)    Time: 1100-1111 PT Time Calculation (min) (ACUTE ONLY): 11 min   Charges:         PT G Codes:        Bhargav Barbaro, SPT   Mingo Siegert 11/25/2017, 12:13 PM

## 2017-11-25 NOTE — Progress Notes (Signed)
Responsive this morning.  Mouthing words.  Follows simple commands.  Remains on full ventilator support.  No distress.  Vitals:   11/25/17 0900 11/25/17 0909 11/25/17 1000 11/25/17 1100  BP: 128/88  131/89 126/82  Pulse: 84  (!) 102 94  Resp: 13  (!) 23 20  Temp:      TempSrc:      SpO2: 99% 100% 97% 96%  Weight:      Height:       NAD, RASS 0 HEENT: NCAT, sclerae white No JVD noted Diminished breath sounds, no wheezes RRR, no M Abdomen soft, NT, NABS BUE, BLE pitting edema decreased Diffuse profound weakness, no focal deficits   BMP Latest Ref Rng & Units 11/24/2017 11/22/2017 11/18/2017  Glucose 65 - 99 mg/dL 119(H) 121(H) 120(H)  BUN 6 - 20 mg/dL 20 20 22(H)  Creatinine 0.44 - 1.00 mg/dL <0.30(L) <0.30(L) <0.30(L)  Sodium 135 - 145 mmol/L 141 141 139  Potassium 3.5 - 5.1 mmol/L 3.8 3.6 4.0  Chloride 101 - 111 mmol/L 100(L) 99(L) 101  CO2 22 - 32 mmol/L 35(H) 34(H) 31  Calcium 8.9 - 10.3 mg/dL 8.6(L) 8.8(L) 8.3(L)    CBC Latest Ref Rng & Units 11/24/2017 11/22/2017 11/20/2017  WBC 3.6 - 11.0 K/uL 9.5 6.5 10.3  Hemoglobin 12.0 - 16.0 g/dL 7.7(L) 7.9(L) 7.7(L)  Hematocrit 35.0 - 47.0 % 22.7(L) 24.8(L) 24.6(L)  Platelets 150 - 440 K/uL 363 345 280   Results for orders placed or performed during the hospital encounter of 09/30/17  CULTURE, BLOOD (ROUTINE X 2) w Reflex to ID Panel     Status: None   Collection Time: 09/30/17  5:43 PM  Result Value Ref Range Status   Specimen Description BLOOD CENTRAL LINE  Final   Special Requests   Final    BOTTLES DRAWN AEROBIC AND ANAEROBIC Blood Culture adequate volume   Culture   Final    NO GROWTH 5 DAYS Performed at [facility], [address] [place], [place] [address]    Report Status 10/05/2017 FINAL  Final  MRSA PCR Screening     Status: None   Collection Time: 09/30/17  8:02 PM  Result Value Ref Range Status   MRSA by PCR NEGATIVE NEGATIVE Final    Comment:        The GeneXpert MRSA Assay (FDA approved for  NASAL specimens only), is one component of a comprehensive MRSA colonization surveillance program. It is not intended to diagnose MRSA infection nor to guide or monitor treatment for MRSA infections. Performed at [facility], [address], [place], [place] [address]   Urine Culture     Status: None   Collection Time: 09/30/17  8:04 PM  Result Value Ref Range Status   Specimen Description   Final    URINE, RANDOM Performed at [facility], [address], [place], [place] [address]    Special Requests   Final    NONE Performed at [facility], [address], [place], [place] [address]    Culture   Final    NO GROWTH Performed at [facility], [place] [address], [place], [place] [address]    Report Status 10/02/2017 FINAL  Final  CULTURE, BLOOD (ROUTINE X 2) w Reflex to ID Panel     Status: None   Collection Time: 09/30/17  8:16 PM  Result Value Ref Range Status   Specimen Description BLOOD L HAND  Final   Special Requests   Final  BOTTLES DRAWN AEROBIC AND ANAEROBIC Blood Culture results may not be optimal due to an inadequate volume of blood received in culture bottles   Culture   Final    NO GROWTH 5 DAYS Performed at [facility], [address] [place], [place] [address]    Report Status 10/05/2017 FINAL  Final  Culture, respiratory (NON-Expectorated)     Status: None   Collection Time: 09/30/17  9:07 PM  Result Value Ref Range Status   Specimen Description   Final    TRACHEAL ASPIRATE Performed at [facility], [address], [place], [place] [address]    Special Requests   Final    NONE Performed at [facility], [address] [place], [place] [address]    Gram Stain   Final    MODERATE WBC PRESENT, PREDOMINANTLY PMN FEW GRAM POSITIVE COCCI IN PAIRS IN CLUSTERS FEW GRAM NEGATIVE RODS    Culture   Final    FEW Consistent with normal respiratory flora. Performed at [facility], [place] [address], [place], [place] [address]    Report Status 10/03/2017 FINAL  Final  CULTURE, BLOOD (ROUTINE X 2) w Reflex to ID Panel     Status: None   Collection Time: 10/05/17 11:48 AM  Result Value Ref Range Status   Specimen Description BLOOD LAC  Final   Special Requests   Final    BOTTLES DRAWN AEROBIC AND ANAEROBIC Blood Culture adequate volume   Culture   Final    NO GROWTH 5 DAYS Performed at [facility], [address] [place], [place] [address]    Report Status 10/10/2017 FINAL  Final  CULTURE, BLOOD (ROUTINE X 2) w Reflex to ID Panel     Status: None   Collection Time: 10/05/17 11:55 AM  Result Value Ref Range Status   Specimen Description BLOOD LT HAND  Final   Special Requests   Final    BOTTLES DRAWN AEROBIC AND ANAEROBIC Blood Culture adequate volume   Culture   Final    NO GROWTH 5 DAYS Performed at [facility], [address], [place], [place] [address]    Report Status 10/10/2017 FINAL  Final  Culture, expectorated sputum-assessment     Status: None   Collection Time: 10/05/17  2:18 PM  Result Value Ref Range Status   Specimen Description TRACHEAL ASPIRATE  Final   Special Requests NONE  Final   Sputum evaluation   Final    THIS SPECIMEN IS ACCEPTABLE FOR SPUTUM CULTURE Performed at [facility], [address], [place], [place] [address]    Report Status 10/05/2017 FINAL  Final  Culture, respiratory (NON-Expectorated)     Status: None   Collection Time: 10/05/17  2:18 PM  Result Value Ref Range Status   Specimen Description   Final    TRACHEAL ASPIRATE Performed at [facility], [address] [place], [place] [address]    Special Requests   Final    NONE Reflexed from [id number] Performed at [facility], [address] [place], [place] [address]    Gram Stain   Final    MODERATE WBC PRESENT,BOTH PMN AND MONONUCLEAR NO SQUAMOUS EPITHELIAL CELLS SEEN FEW YEAST WITH PSEUDOHYPHAE RARE GRAM  POSITIVE COCCI IN CLUSTERS Performed at [facility], [place] [address] [place], [place] [address]    Culture FEW CANDIDA ALBICANS FEW CANDIDA DUBLINIENSIS   Final   Report Status 10/08/2017 FINAL  Final  CULTURE,  BLOOD (ROUTINE X 2) w Reflex to ID Panel     Status: Abnormal   Collection Time: 10/18/17 10:51 AM  Result Value Ref Range Status   Specimen Description   Final    BLOOD LEFT ANTECUBITAL Performed at  [facility], [address] [place], [place] [address]    Special Requests   Final    BOTTLES DRAWN AEROBIC AND ANAEROBIC Blood Culture adequate volume Performed at [facility], [address] [place], [place] [address]    Culture  Setup Time   Final    GRAM POSITIVE COCCI ANAEROBIC BOTTLE ONLY CRITICAL RESULT CALLED TO, READ BACK BY AND VERIFIED WITH: MELISSA MACCIA 10/19/17 @ 3382  [place]    Culture (A)  Final    STAPHYLOCOCCUS SPECIES (COAGULASE NEGATIVE) THE SIGNIFICANCE OF ISOLATING THIS ORGANISM FROM A SINGLE SET OF BLOOD CULTURES WHEN MULTIPLE SETS ARE DRAWN IS UNCERTAIN. PLEASE NOTIFY THE MICROBIOLOGY DEPARTMENT WITHIN ONE WEEK IF SPECIATION AND SENSITIVITIES ARE REQUIRED. Performed at [facility], [place] [address], [place], [place] [address]    Report Status 10/21/2017 FINAL  Final  Blood Culture ID Panel (Reflexed)     Status: Abnormal   Collection Time: 10/18/17 10:51 AM  Result Value Ref Range Status   Enterococcus species NOT DETECTED NOT DETECTED Final   Listeria monocytogenes NOT DETECTED NOT DETECTED Final   Staphylococcus species DETECTED (A) NOT DETECTED Final    Comment: Methicillin (oxacillin) resistant coagulase negative staphylococcus. Possible blood culture contaminant (unless isolated from more than one blood culture draw or clinical case suggests pathogenicity). No antibiotic treatment is indicated for blood  culture contaminants. CRITICAL RESULT CALLED TO, READ BACK BY AND VERIFIED WITH: MELISSA [place] 10/19/17 @ 7673   [place]    Staphylococcus aureus NOT DETECTED NOT DETECTED Final   Methicillin resistance DETECTED (A) NOT DETECTED Final    Comment: CRITICAL RESULT CALLED TO, READ BACK BY AND VERIFIED WITH: MELISSA MACCIA 10/19/17 @ 4193  [place]    Streptococcus species NOT DETECTED NOT DETECTED Final   Streptococcus agalactiae NOT DETECTED NOT DETECTED Final   Streptococcus pneumoniae NOT DETECTED NOT DETECTED Final   Streptococcus pyogenes NOT DETECTED NOT DETECTED Final   Acinetobacter baumannii NOT DETECTED NOT DETECTED Final   Enterobacteriaceae species NOT DETECTED NOT DETECTED Final   Enterobacter cloacae complex NOT DETECTED NOT DETECTED Final   Escherichia coli NOT DETECTED NOT DETECTED Final   Klebsiella oxytoca NOT DETECTED NOT DETECTED Final   Klebsiella pneumoniae NOT DETECTED NOT DETECTED Final   Proteus species NOT DETECTED NOT DETECTED Final   Serratia marcescens NOT DETECTED NOT DETECTED Final   Haemophilus influenzae NOT DETECTED NOT DETECTED Final   Neisseria meningitidis NOT DETECTED NOT DETECTED Final   Pseudomonas aeruginosa NOT DETECTED NOT DETECTED Final   Candida albicans NOT DETECTED NOT DETECTED Final   Candida glabrata NOT DETECTED NOT DETECTED Final   Candida krusei NOT DETECTED NOT DETECTED Final   Candida parapsilosis NOT DETECTED NOT DETECTED Final   Candida tropicalis NOT DETECTED NOT DETECTED Final    Comment: Performed at [facility], [address] [place], [place] [address]  Culture, respiratory (NON-Expectorated)     Status: None   Collection Time: 10/18/17 11:09 AM  Result Value Ref Range Status   Specimen Description   Final    TRACHEAL ASPIRATE Performed at [facility], [address], [place], [place] [address]    Special Requests   Final    NONE Performed at [facility], [place], [place] [address]    Gram Stain   Final    MODERATE WBC PRESENT, PREDOMINANTLY PMN NO ORGANISMS SEEN Performed at [place] [address] [place], [place] [address]    Culture   Final    RARE CANDIDA DUBLINIENSIS RARE FUNGUS (MOLD) ISOLATED, PROBABLE CONTAMINANT/COLONIZER (SAPROPHYTE). CONTACT MICROBIOLOGY IF FURTHER IDENTIFICATION REQUIRED [phone].    Report Status 10/21/2017 FINAL  Final  CULTURE, BLOOD (ROUTINE X 2) w Reflex to ID Panel     Status: None   Collection Time: 10/18/17  4:09 PM  Result Value Ref Range Status   Specimen Description BLOOD L FA  Final   Special Requests   Final    BOTTLES DRAWN AEROBIC AND ANAEROBIC Blood Culture adequate volume   Culture   Final    NO GROWTH 5 DAYS Performed at [facility], [address] [place], [place] [address]    Report Status 10/23/2017 FINAL  Final  CULTURE, BLOOD (ROUTINE X 2) w Reflex to ID Panel     Status: None   Collection Time: 10/19/17  8:36 AM  Result Value Ref Range Status   Specimen Description BLOOD BLOOD LEFT HAND  Final   Special Requests   Final    BOTTLES DRAWN AEROBIC AND ANAEROBIC Blood Culture results may not be optimal due to an inadequate volume of blood received in culture bottles   Culture   Final    NO GROWTH 5 DAYS Performed at [facility], [address] [place], [place] [address]    Report Status 10/24/2017 FINAL  Final  CULTURE, BLOOD (ROUTINE X 2) w Reflex to ID Panel     Status: None   Collection Time: 10/19/17  8:52 AM  Result Value Ref Range Status   Specimen Description BLOOD A-LINE DRAW  Final   Special Requests   Final    BOTTLES DRAWN AEROBIC AND ANAEROBIC Blood Culture adequate volume   Culture   Final    NO GROWTH 5 DAYS Performed at [facility], [address] [place], [place] [address]    Report Status 10/24/2017 FINAL  Final  Culture, respiratory (NON-Expectorated)     Status: None   Collection Time: 10/23/17 11:36 PM  Result Value Ref Range Status   Specimen Description   Final    TRACHEAL ASPIRATE Performed at [facility], [address] [place], [place] [address]    Special Requests   Final    NONE Performed at [facility], [address] [place], [place] [address]    Gram Stain   Final    RARE WBC PRESENT, PREDOMINANTLY PMN NO ORGANISMS SEEN Performed at [facility], [place] [address] [place], [place] [address]    Culture   Final    Consistent with normal respiratory flora. FUNGUS (MOLD) ISOLATED, PROBABLE CONTAMINANT/COLONIZER (SAPROPHYTE). CONTACT MICROBIOLOGY IF FURTHER IDENTIFICATION REQUIRED [phone].    Report Status 10/26/2017 FINAL  Final  Urine Culture     Status: Abnormal   Collection Time: 11/01/17  4:57 AM  Result Value Ref Range Status   Specimen Description   Final    URINE, CLEAN CATCH Performed at [facility], [address], [place], [place] [address]    Special Requests   Final    NONE Performed at [facility], [address] [place],  [address]    Culture (A)  Final    <10,000 COLONIES/mL INSIGNIFICANT GROWTH Performed at  [facility], [place] [address], [place], [place] [address]    Report Status 11/02/2017 FINAL  Final  CULTURE, BLOOD (ROUTINE X 2) w Reflex to ID Panel     Status: None   Collection Time: 11/01/17  1:28 PM  Result Value Ref Range Status   Specimen Description BLOOD LINE  Final   Special Requests   Final    BOTTLES DRAWN AEROBIC AND ANAEROBIC Blood Culture adequate volume   Culture   Final    NO GROWTH 5 DAYS Performed at [facility], [address] [place], [place] [address]    Report Status 11/06/2017 FINAL  Final  CULTURE, BLOOD (ROUTINE X 2) w Reflex to ID Panel     Status: None   Collection Time: 11/01/17  1:51 PM  Result Value Ref Range Status   Specimen Description BLOOD R HAND  Final   Special Requests   Final    BOTTLES DRAWN AEROBIC AND ANAEROBIC Blood Culture results may not be optimal due to an inadequate volume of blood received in culture bottles   Culture   Final    NO GROWTH 5  DAYS Performed at [facility], [address], [place], [place] [address]    Report Status 11/06/2017 FINAL  Final  Culture, respiratory (NON-Expectorated)     Status: None   Collection Time: 11/04/17 12:20 PM  Result Value Ref Range Status   Specimen Description   Final    SPUTUM Performed at [facility], [address], [place], [place] [address]    Special Requests   Final    NONE Performed at [facility], [address] [place], [place] [address]    Gram Stain   Final    ABUNDANT WBC PRESENT,BOTH PMN AND MONONUCLEAR RARE GRAM POSITIVE COCCI Performed at [facility], [place] [address], [place], [place] [address]    Culture   Final    RARE FUNGUS (MOLD) ISOLATED, PROBABLE CONTAMINANT/COLONIZER (SAPROPHYTE). CONTACT MICROBIOLOGY IF FURTHER IDENTIFICATION REQUIRED [phone].   Report Status 11/06/2017 FINAL  Final  Culture, respiratory (NON-Expectorated)     Status: None   Collection Time: 11/17/17  7:57 AM  Result Value Ref Range Status   Specimen Description   Final    TRACHEAL ASPIRATE Performed at [facility], [address], [place], [place] [address]    Special Requests   Final    NONE Performed at [facility], [address] [place], [place] [address]    Gram Stain   Final    MODERATE WBC PRESENT,BOTH PMN AND MONONUCLEAR MODERATE GRAM POSITIVE COCCI IN PAIRS RARE GRAM NEGATIVE RODS Performed at [facility], [place] [address], [place],  [address]    Culture ABUNDANT STAPHYLOCOCCUS AUREUS  Final   Report Status 11/19/2017 FINAL  Final   Organism ID, Bacteria STAPHYLOCOCCUS AUREUS  Final      Susceptibility   Staphylococcus aureus - MIC*    CIPROFLOXACIN <=0.5 SENSITIVE Sensitive     ERYTHROMYCIN <=0.25 SENSITIVE Sensitive     GENTAMICIN <=0.5 SENSITIVE Sensitive     OXACILLIN <=0.25 SENSITIVE Sensitive     TETRACYCLINE <=1 SENSITIVE Sensitive     VANCOMYCIN 1 SENSITIVE Sensitive      TRIMETH/SULFA <=10 SENSITIVE Sensitive     CLINDAMYCIN <=0.25 SENSITIVE Sensitive     RIFAMPIN <=0.5 SENSITIVE Sensitive     Inducible Clindamycin NEGATIVE Sensitive     * ABUNDANT STAPHYLOCOCCUS AUREUS  CULTURE, BLOOD (ROUTINE X 2)  w Reflex to ID Panel     Status: None   Collection Time: 11/17/17  9:09 AM  Result Value Ref Range Status   Specimen Description BLOOD LEFT HAND  Final   Special Requests   Final    BOTTLES DRAWN AEROBIC AND ANAEROBIC Blood Culture adequate volume   Culture   Final    NO GROWTH 5 DAYS Performed at [facility], [address], [place], [place] [address]    Report Status 11/22/2017 FINAL  Final  CULTURE, BLOOD (ROUTINE X 2) w Reflex to ID Panel     Status: Abnormal   Collection Time: 11/17/17 10:05 AM  Result Value Ref Range Status   Specimen Description   Final    BLOOD L HAND Performed at [facility], [address], [place], [place] [address]    Special Requests   Final    BOTTLES DRAWN AEROBIC AND ANAEROBIC Blood Culture adequate volume Performed at [facility], [address] [place], [place] [address]    Culture  Setup Time   Final    GRAM POSITIVE COCCI ANAEROBIC BOTTLE ONLY CRITICAL RESULT CALLED TO, READ BACK BY AND VERIFIED WITH: H. ZOMPA PHARMD, AT 1056 11/20/17 BY D. VANHOOK    Culture (A)  Final    MICROCOCCUS SPECIES Standardized susceptibility testing for this organism is not available. Performed at [facility], [place] [address], [place], [place] [address]    Report Status 11/21/2017 FINAL  Final  Culture, respiratory (NON-Expectorated)     Status: None (Preliminary result)   Collection Time: 11/24/17  3:57 PM  Result Value Ref Range Status   Specimen Description   Final    SPUTUM Performed at [facility], [address] [place], [place] [address]    Special Requests   Final    NONE Performed at [place], [place], [place] [address]    Gram Stain    Final    NO WBC SEEN NO ORGANISMS SEEN MODERATE GRAM NEGATIVE RODS FEW GRAM POSITIVE COCCI IN CLUSTERS    Culture   Final    TOO YOUNG TO READ Performed at [facility], [address] [place], [place] [address]    Report Status PENDING  Incomplete  CULTURE, BLOOD (ROUTINE X 2) w Reflex to ID Panel     Status: None (Preliminary result)   Collection Time: 11/24/17  4:29 PM  Result Value Ref Range Status   Specimen Description BLOOD LEFT ASSIST CONTROL  Final   Special Requests   Final    BOTTLES DRAWN AEROBIC AND ANAEROBIC Blood Culture adequate volume   Culture   Final    NO GROWTH < 24 HOURS Performed at [facility], [address] [place], [place] [address]    Report Status PENDING  Incomplete  CULTURE, BLOOD (ROUTINE X 2) w Reflex to ID Panel     Status: None (Preliminary result)   Collection Time: 11/24/17  6:08 PM  Result Value Ref Range Status   Specimen Description BLOOD L HAND  Final   Special Requests   Final    BOTTLES DRAWN AEROBIC AND ANAEROBIC Blood Culture adequate volume   Culture   Final    NO GROWTH < 12 HOURS Performed at [facility], [address] [place], [place] [address]    Report Status PENDING  Incomplete    Vanc 06/16 >> 06/18 Cefepime 06/16 >> 06/18 Cefazolin 06/18 >>   CXR: No  new film   IMP: Very severe COPD Prolonged mechanical ventilation Failure to wean- vent dependency tolerated 1.5 hours of SBT today Tracheostomy tube status MSSA HCAP/VAP 06/16  ICU acquired anemia G-tube status Anxiety/panic disorder Major depression  Severe protein calorie malnutrition Anasarca Chronic anemia Pyrexia- will repeat cultures  PLAN/REC: Cont to wean in PSV <> ATC as tolerated Cont vent bundle Will give a dose of Albumin and Lasix today Daily SBT as per pt tolerance and per protocol Continue nebulized steroids and bronchodilators Monitor temp, WBC count Micro and abx as above  DVT px: SQ heparin Monitor CBC  intermittently Transfuse per usual guidelines  Cont scheduled clonazepam, quetiapine - dose reduced 06/18 Continue paroxetine per psychiatry Continue PRN alprazolam  Family: Family meeting with patient's daughter, sister, care management, and social worker completed this AM with plans to attempt for SNIF placement and or return home.  Cammie Sickle, MD PCCM service Pager [phone] 11/25/2017 1:23 PM

## 2017-11-25 NOTE — Care Management (Addendum)
Daughter Michelle [name] provided nurse with this address: [place], [place], [place].  I have updated Michelle [name] with Michelle [name] and Michelle [name] with Edward [name].  I finally reached Michelle [name] and asked that she be here by [name] but she said Michelle [name] has to be at work at that time.  I asked that they be here in 15/20 minutes and Michelle [name] said they can- I have included Michelle [name] and Michelle [name] if available. Update at 1330: RNCM met with daughter Michelle [name], patient's sister, Michelle [name] with Michelle [name], Michelle [name] CSW, Myra RN regarding transition of [name]. Michelle [name] admits reservations about bringing patient home. She works in [place] and hopes she can go to Michelle [name] in [name] or close to [name]. RNCM and all agreed that patient will go home if there is no Michelle [name] available as we were working on. RNCM stressed the need to allow home assessment.  [facility] not have bed per Michelle [name]; [name] in [place] requested patient information to be faxed and CSW will fax for their review; patient will be placed on waiting list. In the mean time, Michelle [name] will reach back to Michelle [name] for a 3-way call with sister Michelle [name] to establish another meeting time. Michelle [name] with Michelle [name] updated also.  Updated at 1625: Michelle [name] attempted to reach Michelle [name] again and not able to leave a voicemail. RNCM requested Michelle continue to try as she was going to work- suggested text be sent also.

## 2017-11-25 NOTE — Care Management (Signed)
This RNCM attempted to reach daughter [name] [phone] and daughter [name] [phone] to establish family meeting. Message was left on [name]'s VM but Yasha's was not set up.  RNCM will discuss with attending to see next step.  RNCM has also reached out to James[facility] with [facility] to see if vent safety assessment had been arranged and Deb with [facility] is home health assessment had been completed.

## 2017-11-26 DIAGNOSIS — G7281 Critical illness myopathy: Secondary | ICD-10-CM

## 2017-11-26 LAB — GLUCOSE, CAPILLARY
GLUCOSE-CAPILLARY: 89 mg/dL (ref 70–99)
GLUCOSE-CAPILLARY: 90 mg/dL (ref 70–99)
Glucose-Capillary: 82 mg/dL (ref 70–99)

## 2017-11-26 NOTE — Progress Notes (Signed)
[facility] [place] NAME: Michelle [name]    MR#:  [id number]  DATE OF BIRTH:  November 20, 1960  SUBJECTIVE:  Currently on tracheostomy with ventilator support REVIEW OF SYSTEMS:    ROS Unable to obtain secondary to critical illness  DRUG ALLERGIES:   Allergies  Allergen Reactions  . Carvedilol Other (See Comments)    Pt gets "sick" when on medication    VITALS:  Blood pressure 117/72, pulse 99, temperature 98.1 F (36.7 C), temperature source Axillary, resp. rate 18, height 4\' 9"  (1.448 m), weight 66.2 kg (145 lb 15.1 oz), last menstrual period 03/06/2005, SpO2 94 %.  PHYSICAL EXAMINATION:   Physical Exam  GENERAL:  57 y.o.-year-old patient lying in the bed with tracheostomy on ventilator support EYES: Pupils equal, round, reactive to light and accommodation. No scleral icterus. Extraocular muscles intact.  HEENT: Head atraumatic, normocephalic.  Oropharynx clear NECK:  Supple, no jugular venous distention. No thyroid enlargement, no tenderness.  Has tracheostomy with vent support FIO2 30 % Peep 5 Rate 18 TV 450 ml LUNGS: Decreased breath sounds bilaterally, rales heard in both lungs. No use of accessory muscles of respiration.  CARDIOVASCULAR: S1, S2 normal. No murmurs, rubs, or gallops.  ABDOMEN: Soft, nontender, nondistended. Bowel sounds present. No organomegaly or mass.  Peg tube present EXTREMITIES: No cyanosis, clubbing or edema b/l.    NEUROLOGIC: Not oriented to time, place and person PSYCHIATRIC: could not be assessed SKIN: Has decubitus ulcer  LABORATORY PANEL:   CBC Recent Labs  Lab 11/24/17 0644  WBC 9.5  HGB 7.7*  HCT 22.7*  PLT 363   ------------------------------------------------------------------------------------------------------------------ Chemistries  Recent Labs  Lab 11/24/17 0644  NA 141  K 3.8  CL 100*  CO2 35*  GLUCOSE 119*  BUN 20  CREATININE <0.30*  CALCIUM 8.6*  MG 1.9    ------------------------------------------------------------------------------------------------------------------  Cardiac Enzymes No results for input(s): TROPONINI in the last 168 hours. ------------------------------------------------------------------------------------------------------------------  RADIOLOGY:  No results found.   ASSESSMENT AND PLAN:  57 year old female patient currently in ICU for respiratory failure, hypotension, pneumonia  - Acute on chronic hypoxic respiratory failure Status post tracheostomy on vent support Currently still on FiO2 of 30% Continue vent per intensivist Unable to wean.   - Health Care Associated Pneumonia Tracheal aspirates : Staph Aureus On IV cefazolin abx   - Hypotension  intermittently hypotensive  Pressors as needed  - Critical illness polyneuropathy  -Acute on chronic COPD exacerbation Continue nebulization treatments  -Anxiety disorder anxiolytic medications  -Stage II decubitus ulcer Wound care protocol as per nursing staff  CM for d/c planning.    All the records are reviewed and case discussed with Care Management/Social Worker. Management plans discussed with the patient, family and they are in agreement.  CODE STATUS: Partial  DVT Prophylaxis: SCDs  TOTAL TIME TAKING CARE OF THIS PATIENT: 34 minutes.     Michelle [name] M.D on 11/26/2017 at 9:29 AM  Between 7am to 6pm - Pager - [phone]  After 6pm go to [email or web address] - password [email or web address] [facility]  Office  [phone]  CC: Primary care physician; Center, [facility]  Note: This dictation was prepared with Dragon dictation along with smaller phrase technology. Any transcriptional errors that result from this process are unintentional.

## 2017-11-26 NOTE — Care Management (Addendum)
[facility] [phone] Fax [phone] -  sees Dr. Patsy Baltimore. They state patient missed April appointment as no-show.  RNCM explained that patient was in ICU at time of appointment.  RNCM spoke with "nurse" Gloriann Loan to see if Dr. Patsy Baltimore would consider signing home nurse orders if patient need arise when she returns to home.  RNCM has faxed surgery notes related to trach placement; dietary needs; WOC needs to Dr. Patsy Baltimore for review.  Bell will assess and get back to [facility]. On cover sheet RNCM provided [facility] contact information.

## 2017-11-26 NOTE — Progress Notes (Signed)
   11/26/17 0810  Tracheostomy Shiley 6 mm Cuffed  Placement Date/Time: 10/17/17 1140   Placed By: (c) Other (Comment)  Brand: Shiley  Size (mm): 6 mm  Style: Cuffed  Serial / Lot #: [id number]  Expiration Date: 12/25/21  Status Secured  Site Assessment Clean;Dry  Site Care Cleansed;Dried  Inner Cannula Care Changed/new  Ties Assessment Dry  Cuff pressure (cm) 26 cm  Tracheostomy Equipment at bedside Yes and checklist posted at head of bed  Adult Ventilator Settings  Vent Type Servo i  Vent Mode PRVC  Vt Set 450 mL  Set Rate 16 bmp  FiO2 (%) 35 %  I Time 0.9 Sec(s)  PEEP 5 cmH20  Adult Ventilator Measurements  Peak Airway Pressure 34 L/min  Mean Airway Pressure 15 cmH20  Plateau Pressure 30 cmH20  Resp Rate Spontaneous 4 br/min  Resp Rate Total 20 br/min  Exhaled Vt 432 mL  Spont TV 445 mL  Measured Ve 9 mL  I:E Ratio Measured 1:3  End Tidal CO2 60  Adult Ventilator Alarms  Alarms On Y  Ve High Alarm 20 L/min  Ve Low Alarm 2 L/min  Resp Rate High Alarm 40 br/min  Resp Rate Low Alarm 5  PEEP Low Alarm 2 cmH2O  Press High Alarm 40 cmH2O  VAP Prevention  Cuff pressure (initial) 26 cm H2O  HME changed Yes  HOB> 30 Degrees Y  Breath Sounds  Bilateral Breath Sounds Diminished  Airway Suctioning/Secretions  Suction Type Tracheal  Suction Device  Catheter  Secretion Amount Small  Secretion Color Tan  Secretion Consistency Thick  Suction Tolerance Tolerated well  Suctioning Adverse Effects None

## 2017-11-26 NOTE — Clinical Social Work Note (Addendum)
CSW left another voicemail for [facility] [phone]) in [place] today. Awaiting call back for bed availability status.  UPDATE 1:52pm: Received a call back from Loreen Freud at [facility] beds available. Per family, they are not interested in facilities outside of [place] so no other [facility] SNF options available. RNCM Angela coordinating/following patient for discharge needs. CSW signing off as no further Social Work intervention needed. Please reconsult if new Social Work needs arise.    Oretha Ellis, MSW, [facility] Clinical Social Worker [phone]

## 2017-11-26 NOTE — Progress Notes (Signed)
Pharmacy Antibiotic Note  Michelle [name] is a 57 y.o. female admitted on 09/30/2017 with staph PNA.  Pharmacy has been consulted for cefazolin dosing.  Plan: Continue cefazolin 1 gram q8h. Per AM ICU rounds, will continue for 14 day course. Today is day 10 of 14 of continuous MSSA coverage.   Height: 4\' 9"  (144.8 cm) Weight: 145 lb 15.1 oz (66.2 kg) IBW/kg (Calculated) : 38.6  Temp (24hrs), Avg:98.9 F (37.2 C), Min:97.4 F (36.3 C), Max:99.7 F (37.6 C)  Recent Labs  Lab 11/20/17 0310 11/21/17 0748 11/22/17 0416 11/24/17 0644  WBC 10.3  --  6.5 9.5  CREATININE  --   --  <0.30* <0.30*  VANCOTROUGH  --  <4*  --   --     CrCl cannot be calculated (This lab value cannot be used to calculate CrCl because it is not a number: <0.30).    Allergies  Allergen Reactions  . Carvedilol Other (See Comments)    Pt gets "sick" when on medication    Antimicrobials this admission: 4/29 levofloxacin >> 4/30 4/30 Zosyn >> 5/7 5/4 vancomycin >> 5/6 5/8 erythromycin >> 5/20 5/18 vancomycin >> 5/20 5/30 cefazolin >> 5/30 5/31 ceftriaxone >> 6/2 6/4 cefepime >>  6/4 6/16 vancomycin >> 6/18 6/16 cefepime >> 6/18 6/18 cefazolin >>    Microbiology results: 4/29 MRSA PCR: negative 4/29 UCx: no growth final 4/29 RespCx/tracheal aspirate: few consistent with normal flora 4/29 BCx: no growth final 5/4 RespCx/tracheal aspirate: few candidaa albicans, few candida dubliniensis 5/4 BCx: no growth final 5/17 BCx: MSRA 5/17 RespCX/tracheal aspirate: rare candida dubliniensis 5/18 BCx: no growth final 5/31 UCx: insignificant growth 5/31 BCx: no growth final 6/3 Sputum: rare gram positive cocci 6/16 BCx: no growth x2 days 6/16 RespCx/tracheal aspirate: abundant staph aureus, (MSSA) 6/23 Sputum: few staph aureus, susceptibilities to follow 6/23 BCx: no growth x2 days     Thank you for allowing pharmacy to be a part of this patient's care.   Tyson Babinski 11/26/2017 3:42 PM

## 2017-11-26 NOTE — Progress Notes (Signed)
   11/26/17 1418  Vent Select  Invasive or Noninvasive Invasive  Tracheostomy Shiley 6 mm Cuffed  Placement Date/Time: 10/17/17 1140   Placed By: (c) Other (Comment)  Brand: Shiley  Size (mm): 6 mm  Style: Cuffed  Serial / Lot #: [id number]  Expiration Date: 12/25/21  Status Secured  Site Assessment Clean  Ties Assessment Clean;Secure  Cuff pressure (cm) 24 cm  Adult Ventilator Settings  Vent Type Servo i  Humidity HME  Vent Mode PSV  FiO2 (%) 35 %  Pressure Support 10 cmH20  PEEP 5 cmH20  Adult Ventilator Measurements  Peak Airway Pressure 16 L/min  Mean Airway Pressure 9 cmH20  Plateau Pressure 19 cmH20  Resp Rate Spontaneous 17 br/min  Resp Rate Total 17 br/min  Exhaled Vt 356 mL  Measured Ve 7.1 mL  Auto PEEP 3 cmH20  Total PEEP 8 cmH20  End Tidal CO2 46  SpO2 97 %  Adult Ventilator Alarms  Alarms On Y  Ve High Alarm 20 L/min  Ve Low Alarm 2 L/min  Resp Rate High Alarm 40 br/min  Resp Rate Low Alarm 5  PEEP Low Alarm 2 cmH2O  Press High Alarm 40 cmH2O  Press Low Alarm 2 cmH2O  T Apnea 20 sec(s)  VAP Prevention  HME changed No  Ventilator changed No  Transported while on vent No  HOB> 30 Degrees Y  Daily Weaning Assessment  Daily Assessment of Readiness to Wean Wean protocol criteria met (SBT performed)  SBT Method  (psv 10/5)  Weaning Start Time 1418  Breath Sounds  Bilateral Breath Sounds Diminished  Oral Suctioning/Secretions  Suction Type Oral  Suction Device Yankauer  Secretion Amount Small  Secretion Color Clear  Secretion Consistency Thin  Suction Tolerance Tolerated well  Suctioning Adverse Effects None

## 2017-11-26 NOTE — Progress Notes (Signed)
Responsive this morning.  Mouthing words.  Follows simple commands.  Remains on full ventilator support.  No distress.  Vitals:   11/26/17 0028 11/26/17 0300 11/26/17 0400 11/26/17 0500  BP:  109/68 111/75 112/82  Pulse:  94 95 90  Resp:  (!) 21 19 17   Temp:   98.1 F (36.7 C)   TempSrc:   Axillary   SpO2: 99% 97% 96% 96%  Weight:    145 lb 15.1 oz (66.2 kg)  Height:       NAD, RASS 0 HEENT: NCAT, sclerae white No JVD noted Diminished breath sounds, no wheezes RRR, no M Abdomen soft, NT, NABS BUE, BLE pitting edema- decreased Diffuse profound weakness, no focal deficits   BMP Latest Ref Rng & Units 11/24/2017 11/22/2017 11/18/2017  Glucose 65 - 99 mg/dL 119(H) 121(H) 120(H)  BUN 6 - 20 mg/dL 20 20 22(H)  Creatinine 0.44 - 1.00 mg/dL <0.30(L) <0.30(L) <0.30(L)  Sodium 135 - 145 mmol/L 141 141 139  Potassium 3.5 - 5.1 mmol/L 3.8 3.6 4.0  Chloride 101 - 111 mmol/L 100(L) 99(L) 101  CO2 22 - 32 mmol/L 35(H) 34(H) 31  Calcium 8.9 - 10.3 mg/dL 8.6(L) 8.8(L) 8.3(L)    CBC Latest Ref Rng & Units 11/24/2017 11/22/2017 11/20/2017  WBC 3.6 - 11.0 K/uL 9.5 6.5 10.3  Hemoglobin 12.0 - 16.0 g/dL 7.7(L) 7.9(L) 7.7(L)  Hematocrit 35.0 - 47.0 % 22.7(L) 24.8(L) 24.6(L)  Platelets 150 - 440 K/uL 363 345 280   Results for orders placed or performed during the hospital encounter of 09/30/17  CULTURE, BLOOD (ROUTINE X 2) w Reflex to ID Panel     Status: None   Collection Time: 09/30/17  5:43 PM  Result Value Ref Range Status   Specimen Description BLOOD CENTRAL LINE  Final   Special Requests   Final    BOTTLES DRAWN AEROBIC AND ANAEROBIC Blood Culture adequate volume   Culture   Final    NO GROWTH 5 DAYS Performed at [facility], [address] [place], [place] [address]    Report Status 10/05/2017 FINAL  Final  MRSA PCR Screening     Status: None   Collection Time: 09/30/17  8:02 PM  Result Value Ref Range Status   MRSA by PCR NEGATIVE NEGATIVE Final    Comment:        The  GeneXpert MRSA Assay (FDA approved for NASAL specimens only), is one component of a comprehensive MRSA colonization surveillance program. It is not intended to diagnose MRSA infection nor to guide or monitor treatment for MRSA infections. Performed at [facility], [address] [place], [place] [address]   Urine Culture     Status: None   Collection Time: 09/30/17  8:04 PM  Result Value Ref Range Status   Specimen Description   Final    URINE, RANDOM Performed at [facility], [address] [place], [place] [address]    Special Requests   Final    NONE Performed at [facility], [address], [place], [place] [address]    Culture   Final    NO GROWTH Performed at [facility], [place] [address], [place],  [address]    Report Status 10/02/2017 FINAL  Final  CULTURE, BLOOD (ROUTINE X 2) w Reflex to ID Panel     Status: None   Collection Time: 09/30/17  8:16 PM  Result Value Ref Range Status   Specimen Description BLOOD L HAND  Final  Special Requests   Final    BOTTLES DRAWN AEROBIC AND ANAEROBIC Blood Culture results may not be optimal due to an inadequate volume of blood received in culture bottles   Culture   Final    NO GROWTH 5 DAYS Performed at [facility], [address] [place], [place] [address]    Report Status 10/05/2017 FINAL  Final  Culture, respiratory (NON-Expectorated)     Status: None   Collection Time: 09/30/17  9:07 PM  Result Value Ref Range Status   Specimen Description   Final    TRACHEAL ASPIRATE Performed at [facility], [address] [place], [place] [address]    Special Requests   Final    NONE Performed at [facility], [address] [place], [place] [address]    Gram Stain   Final    MODERATE WBC PRESENT, PREDOMINANTLY PMN FEW GRAM POSITIVE COCCI IN PAIRS IN CLUSTERS FEW GRAM NEGATIVE RODS    Culture   Final    FEW Consistent with normal respiratory  flora. Performed at [facility], [place] [address], [place], [place] [address]    Report Status 10/03/2017 FINAL  Final  CULTURE, BLOOD (ROUTINE X 2) w Reflex to ID Panel     Status: None   Collection Time: 10/05/17 11:48 AM  Result Value Ref Range Status   Specimen Description BLOOD LAC  Final   Special Requests   Final    BOTTLES DRAWN AEROBIC AND ANAEROBIC Blood Culture adequate volume   Culture   Final    NO GROWTH 5 DAYS Performed at [facility], [address] [place], [place] [address]    Report Status 10/10/2017 FINAL  Final  CULTURE, BLOOD (ROUTINE X 2) w Reflex to ID Panel     Status: None   Collection Time: 10/05/17 11:55 AM  Result Value Ref Range Status   Specimen Description BLOOD LT HAND  Final   Special Requests   Final    BOTTLES DRAWN AEROBIC AND ANAEROBIC Blood Culture adequate volume   Culture   Final    NO GROWTH 5 DAYS Performed at [facility], [address] [place], [place] [address]    Report Status 10/10/2017 FINAL  Final  Culture, expectorated sputum-assessment     Status: None   Collection Time: 10/05/17  2:18 PM  Result Value Ref Range Status   Specimen Description TRACHEAL ASPIRATE  Final   Special Requests NONE  Final   Sputum evaluation   Final    THIS SPECIMEN IS ACCEPTABLE FOR SPUTUM CULTURE Performed at [facility], [address] [place], [place] [address]    Report Status 10/05/2017 FINAL  Final  Culture, respiratory (NON-Expectorated)     Status: None   Collection Time: 10/05/17  2:18 PM  Result Value Ref Range Status   Specimen Description   Final    TRACHEAL ASPIRATE Performed at [facility], [address] [place], [place] [address]    Special Requests   Final    NONE Reflexed from [phone] Performed at [facility], [address] [place], [place] [address]    Gram Stain   Final    MODERATE WBC PRESENT,BOTH PMN AND MONONUCLEAR NO SQUAMOUS EPITHELIAL CELLS SEEN FEW  YEAST WITH PSEUDOHYPHAE RARE GRAM POSITIVE COCCI IN CLUSTERS Performed at [facility], [place] [address] [place], [place] [address]    Culture FEW CANDIDA ALBICANS FEW CANDIDA DUBLINIENSIS   Final  Report Status 10/08/2017 FINAL  Final  CULTURE, BLOOD (ROUTINE X 2) w Reflex to ID Panel     Status: Abnormal   Collection Time: 10/18/17 10:51 AM  Result Value Ref Range Status   Specimen Description   Final    BLOOD LEFT ANTECUBITAL Performed at [facility], [address], [place], [place] [address]    Special Requests   Final    BOTTLES DRAWN AEROBIC AND ANAEROBIC Blood Culture adequate volume Performed at [facility], [address] [place], [place] [address]    Culture  Setup Time   Final    GRAM POSITIVE COCCI ANAEROBIC BOTTLE ONLY CRITICAL RESULT CALLED TO, READ BACK BY AND VERIFIED WITH: MELISSA MACCIA 10/19/17 @ 0714  MLK    Culture (A)  Final    STAPHYLOCOCCUS SPECIES (COAGULASE NEGATIVE) THE SIGNIFICANCE OF ISOLATING THIS ORGANISM FROM A SINGLE SET OF BLOOD CULTURES WHEN MULTIPLE SETS ARE DRAWN IS UNCERTAIN. PLEASE NOTIFY THE MICROBIOLOGY DEPARTMENT WITHIN ONE WEEK IF SPECIATION AND SENSITIVITIES ARE REQUIRED. Performed at [facility], [place] [address] [place], [place] [address]    Report Status 10/21/2017 FINAL  Final  Blood Culture ID Panel (Reflexed)     Status: Abnormal   Collection Time: 10/18/17 10:51 AM  Result Value Ref Range Status   Enterococcus species NOT DETECTED NOT DETECTED Final   Listeria monocytogenes NOT DETECTED NOT DETECTED Final   Staphylococcus species DETECTED (A) NOT DETECTED Final    Comment: Methicillin (oxacillin) resistant coagulase negative staphylococcus. Possible blood culture contaminant (unless isolated from more than one blood culture draw or clinical case suggests pathogenicity). No antibiotic treatment is indicated for blood  culture contaminants. CRITICAL RESULT CALLED TO, READ BACK BY AND VERIFIED  WITH: MELISSA [place] 10/19/17 @ 6734  [place]    Staphylococcus aureus NOT DETECTED NOT DETECTED Final   Methicillin resistance DETECTED (A) NOT DETECTED Final    Comment: CRITICAL RESULT CALLED TO, READ BACK BY AND VERIFIED WITH: MELISSA MACCIA 10/19/17 @ 1937  [place]    Streptococcus species NOT DETECTED NOT DETECTED Final   Streptococcus agalactiae NOT DETECTED NOT DETECTED Final   Streptococcus pneumoniae NOT DETECTED NOT DETECTED Final   Streptococcus pyogenes NOT DETECTED NOT DETECTED Final   Acinetobacter baumannii NOT DETECTED NOT DETECTED Final   Enterobacteriaceae species NOT DETECTED NOT DETECTED Final   Enterobacter cloacae complex NOT DETECTED NOT DETECTED Final   Escherichia coli NOT DETECTED NOT DETECTED Final   Klebsiella oxytoca NOT DETECTED NOT DETECTED Final   Klebsiella pneumoniae NOT DETECTED NOT DETECTED Final   Proteus species NOT DETECTED NOT DETECTED Final   Serratia marcescens NOT DETECTED NOT DETECTED Final   Haemophilus influenzae NOT DETECTED NOT DETECTED Final   Neisseria meningitidis NOT DETECTED NOT DETECTED Final   Pseudomonas aeruginosa NOT DETECTED NOT DETECTED Final   Candida albicans NOT DETECTED NOT DETECTED Final   Candida glabrata NOT DETECTED NOT DETECTED Final   Candida krusei NOT DETECTED NOT DETECTED Final   Candida parapsilosis NOT DETECTED NOT DETECTED Final   Candida tropicalis NOT DETECTED NOT DETECTED Final    Comment: Performed at [facility], [address] [place], [place] [address]  Culture, respiratory (NON-Expectorated)     Status: None   Collection Time: 10/18/17 11:09 AM  Result Value Ref Range Status   Specimen Description   Final    TRACHEAL ASPIRATE Performed at [facility], [address] [place], [place] [address]    Special Requests   Final  NONE Performed at [facility], [address] [place], [place] [address]    Gram Stain   Final    MODERATE WBC PRESENT, PREDOMINANTLY PMN NO  ORGANISMS SEEN Performed at [place] [address] [place], [place] [address]    Culture   Final    RARE CANDIDA DUBLINIENSIS RARE FUNGUS (MOLD) ISOLATED, PROBABLE CONTAMINANT/COLONIZER (SAPROPHYTE). CONTACT MICROBIOLOGY IF FURTHER IDENTIFICATION REQUIRED [phone].    Report Status 10/21/2017 FINAL  Final  CULTURE, BLOOD (ROUTINE X 2) w Reflex to ID Panel     Status: None   Collection Time: 10/18/17  4:09 PM  Result Value Ref Range Status   Specimen Description BLOOD L FA  Final   Special Requests   Final    BOTTLES DRAWN AEROBIC AND ANAEROBIC Blood Culture adequate volume   Culture   Final    NO GROWTH 5 DAYS Performed at [facility], [address] [place], [place] [address]    Report Status 10/23/2017 FINAL  Final  CULTURE, BLOOD (ROUTINE X 2) w Reflex to ID Panel     Status: None   Collection Time: 10/19/17  8:36 AM  Result Value Ref Range Status   Specimen Description BLOOD BLOOD LEFT HAND  Final   Special Requests   Final    BOTTLES DRAWN AEROBIC AND ANAEROBIC Blood Culture results may not be optimal due to an inadequate volume of blood received in culture bottles   Culture   Final    NO GROWTH 5 DAYS Performed at [facility], [address] [place], [place] [address]    Report Status 10/24/2017 FINAL  Final  CULTURE, BLOOD (ROUTINE X 2) w Reflex to ID Panel     Status: None   Collection Time: 10/19/17  8:52 AM  Result Value Ref Range Status   Specimen Description BLOOD A-LINE DRAW  Final   Special Requests   Final    BOTTLES DRAWN AEROBIC AND ANAEROBIC Blood Culture adequate volume   Culture   Final    NO GROWTH 5 DAYS Performed at [facility], [address], [place], [place] [address]    Report Status 10/24/2017 FINAL  Final  Culture, respiratory (NON-Expectorated)     Status: None   Collection Time: 10/23/17 11:36 PM  Result Value Ref Range Status   Specimen Description   Final    TRACHEAL ASPIRATE Performed  at [facility], [address], [place], [place] [address]    Special Requests   Final    NONE Performed at [facility], [address] [place], [place] [address]    Gram Stain   Final    RARE WBC PRESENT, PREDOMINANTLY PMN NO ORGANISMS SEEN Performed at [facility], [place] [address], [place], [place] [address]    Culture   Final    Consistent with normal respiratory flora. FUNGUS (MOLD) ISOLATED, PROBABLE CONTAMINANT/COLONIZER (SAPROPHYTE). CONTACT MICROBIOLOGY IF FURTHER IDENTIFICATION REQUIRED [phone].    Report Status 10/26/2017 FINAL  Final  Urine Culture     Status: Abnormal   Collection Time: 11/01/17  4:57 AM  Result Value Ref Range Status   Specimen Description   Final    URINE, CLEAN CATCH Performed at [facility], [address], [place], [place] [address]    Special Requests   Final    NONE Performed at [facility], [address] [place], [place] [address]    Culture (A)  Final    <10,000  COLONIES/mL INSIGNIFICANT GROWTH Performed at [place] [address], [place], [place] [address]    Report Status 11/02/2017 FINAL  Final  CULTURE, BLOOD (ROUTINE X 2) w Reflex to ID Panel     Status: None   Collection Time: 11/01/17  1:28 PM  Result Value Ref Range Status   Specimen Description BLOOD LINE  Final   Special Requests   Final    BOTTLES DRAWN AEROBIC AND ANAEROBIC Blood Culture adequate volume   Culture   Final    NO GROWTH 5 DAYS Performed at [facility], [address] [place], [place] [address]    Report Status 11/06/2017 FINAL  Final  CULTURE, BLOOD (ROUTINE X 2) w Reflex to ID Panel     Status: None   Collection Time: 11/01/17  1:51 PM  Result Value Ref Range Status   Specimen Description BLOOD R HAND  Final   Special Requests   Final    BOTTLES DRAWN AEROBIC AND ANAEROBIC Blood Culture results may not be optimal due to an inadequate volume of blood received in culture  bottles   Culture   Final    NO GROWTH 5 DAYS Performed at [facility], [address] [place], [place] [address]    Report Status 11/06/2017 FINAL  Final  Culture, respiratory (NON-Expectorated)     Status: None   Collection Time: 11/04/17 12:20 PM  Result Value Ref Range Status   Specimen Description   Final    SPUTUM Performed at [facility], [address], [place], [place] [address]    Special Requests   Final    NONE Performed at [facility], [address], [place], [place] [address]    Gram Stain   Final    ABUNDANT WBC PRESENT,BOTH PMN AND MONONUCLEAR RARE GRAM POSITIVE COCCI Performed at [facility], [place] [address] [place], [place] [address]    Culture   Final    RARE FUNGUS (MOLD) ISOLATED, PROBABLE CONTAMINANT/COLONIZER (SAPROPHYTE). CONTACT MICROBIOLOGY IF FURTHER IDENTIFICATION REQUIRED [phone].   Report Status 11/06/2017 FINAL  Final  Culture, respiratory (NON-Expectorated)     Status: None   Collection Time: 11/17/17  7:57 AM  Result Value Ref Range Status   Specimen Description   Final    TRACHEAL ASPIRATE Performed at [facility], [address] [place], [place] [address]    Special Requests   Final    NONE Performed at [facility], [address] [place], [place] [address]    Gram Stain   Final    MODERATE WBC PRESENT,BOTH PMN AND MONONUCLEAR MODERATE GRAM POSITIVE COCCI IN PAIRS RARE GRAM NEGATIVE RODS Performed at [facility], [place] [address] [place], [place] [address]    Culture ABUNDANT STAPHYLOCOCCUS AUREUS  Final   Report Status 11/19/2017 FINAL  Final   Organism ID, Bacteria STAPHYLOCOCCUS AUREUS  Final      Susceptibility   Staphylococcus aureus - MIC*    CIPROFLOXACIN <=0.5 SENSITIVE Sensitive     ERYTHROMYCIN <=0.25 SENSITIVE Sensitive     GENTAMICIN <=0.5 SENSITIVE Sensitive     OXACILLIN <=0.25 SENSITIVE Sensitive     TETRACYCLINE <=1 SENSITIVE Sensitive      VANCOMYCIN 1 SENSITIVE Sensitive     TRIMETH/SULFA <=10 SENSITIVE Sensitive     CLINDAMYCIN <=0.25 SENSITIVE Sensitive     RIFAMPIN <=0.5 SENSITIVE Sensitive     Inducible Clindamycin NEGATIVE Sensitive     * ABUNDANT STAPHYLOCOCCUS AUREUS  CULTURE, BLOOD (ROUTINE X 2) w Reflex to ID Panel     Status: None   Collection Time: 11/17/17  9:09 AM  Result Value Ref Range Status   Specimen Description BLOOD LEFT HAND  Final   Special Requests   Final    BOTTLES DRAWN AEROBIC AND ANAEROBIC Blood Culture adequate volume   Culture   Final    NO GROWTH 5 DAYS Performed at [facility], [address] [place], [place] [address]    Report Status 11/22/2017 FINAL  Final  CULTURE, BLOOD (ROUTINE X 2) w Reflex to ID Panel     Status: Abnormal   Collection Time: 11/17/17 10:05 AM  Result Value Ref Range Status   Specimen Description   Final    BLOOD L HAND Performed at [facility], [address], [place], [place] [address]    Special Requests   Final    BOTTLES DRAWN AEROBIC AND ANAEROBIC Blood Culture adequate volume Performed at [facility], [address] [place], [place] [address]    Culture  Setup Time   Final    GRAM POSITIVE COCCI ANAEROBIC BOTTLE ONLY CRITICAL RESULT CALLED TO, READ BACK BY AND VERIFIED WITH: H. ZOMPA PHARMD, AT 1056 11/20/17 BY D. VANHOOK    Culture (A)  Final    MICROCOCCUS SPECIES Standardized susceptibility testing for this organism is not available. Performed at [facility], [place] [address] [place], [place] [address]    Report Status 11/21/2017 FINAL  Final  Culture, respiratory (NON-Expectorated)     Status: None (Preliminary result)   Collection Time: 11/24/17  3:57 PM  Result Value Ref Range Status   Specimen Description   Final    SPUTUM Performed at [facility], [address], [place], [place] [address]    Special Requests   Final    NONE Performed at [facility], [place], [place] [address]    Gram Stain   Final    NO WBC SEEN NO ORGANISMS SEEN MODERATE GRAM NEGATIVE RODS FEW GRAM POSITIVE COCCI IN CLUSTERS    Culture   Final    TOO YOUNG TO READ Performed at [facility], [address] [place], [place] [address]    Report Status PENDING  Incomplete  CULTURE, BLOOD (ROUTINE X 2) w Reflex to ID Panel     Status: None (Preliminary result)   Collection Time: 11/24/17  4:29 PM  Result Value Ref Range Status   Specimen Description BLOOD LEFT ASSIST CONTROL  Final   Special Requests   Final    BOTTLES DRAWN AEROBIC AND ANAEROBIC Blood Culture adequate volume   Culture   Final    NO GROWTH < 24 HOURS Performed at [facility], [address], [place],  [address]    Report Status PENDING  Incomplete  CULTURE, BLOOD (ROUTINE X 2) w Reflex to ID Panel     Status: None (Preliminary result)   Collection Time: 11/24/17  6:08 PM  Result Value Ref Range Status   Specimen Description BLOOD L HAND  Final   Special Requests   Final    BOTTLES DRAWN AEROBIC AND ANAEROBIC Blood Culture adequate volume   Culture   Final    NO GROWTH < 12 HOURS Performed at [facility], [address], [place],  [address]    Report Status PENDING  Incomplete    Vanc 06/16 >> 06/18 Cefepime 06/16 >> 06/18 Cefazolin 06/18 >>  CXR: No new film   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 Repeated cultures negativ  PLAN/REC: Cont to wean in PSV <> ATC as tolerated Cont vent bundle Will give a dose of Albumin and Lasix today Daily SBT as per pt tolerance and per protocol Continue nebulized steroids and  bronchodilators Monitor temp, WBC count Micro and abx as above  DVT px: SQ heparin Monitor CBC intermittently Transfuse per usual guidelines  Cont scheduled clonazepam, quetiapine - dose reduced 06/18 Continue paroxetine per psychiatry Continue PRN alprazolam  Family: Family meeting with patient's daughter, sister, care management, and social worker completed this AM with plans to attempt for SNIF placement and or return home. SNF has no beds  Cammie Sickle, MD PCCM service Pager [phone] 11/26/2017 6:09 AM

## 2017-11-26 NOTE — Care Management (Addendum)
RNCM reached out to both [facility] and [facility] to check status of contact and arrangement of outpatient services. Both attempted to reach daughter [name] 11/25/17 afternoon without success- she said she was supposed to be a work at 2P per our family meeting yesterday at 1:30P. Both agencies updated that patient is ready for transition of care to home as there is no SNF available at this time. Update at 0939A: RNCM received notification that Deb with [facility] has a meeting arranged at patient's address to meet daughter [name]. Update 11A- RNCM received notification that meeting took place. Home is currently being repaired. [facility] said they need floors stable and two exits/entrances before they can go in to care for patient.  Update 1230P: [facility] has a meeting confirmed with [name] on 11/27/17 at 1P however same safety criteria stands and until deemed safe in the home, they will not be able to provide vent here at hospital. All will follow progression of home repair.

## 2017-11-27 LAB — BASIC METABOLIC PANEL
Anion gap: 7 (ref 5–15)
BUN: 15 mg/dL (ref 6–20)
CO2: 33 mmol/L — AB (ref 22–32)
Calcium: 8.6 mg/dL — ABNORMAL LOW (ref 8.9–10.3)
Chloride: 99 mmol/L (ref 98–111)
Creatinine, Ser: <0.3 mg/dL — ABNORMAL LOW (ref 0.44–1.00)
GLUCOSE: 122 mg/dL — AB (ref 70–99)
POTASSIUM: 4.4 mmol/L (ref 3.5–5.1)
Sodium: 139 mmol/L (ref 135–145)

## 2017-11-27 LAB — CBC WITH DIFFERENTIAL/PLATELET
BASOS PCT: 0 %
BLASTS: 0 %
Band Neutrophils: 0 %
Basophils Absolute: 0 10*3/uL (ref 0–0.1)
Eosinophils Absolute: 0.1 10*3/uL (ref 0–0.7)
Eosinophils Relative: 1 %
HCT: 25.7 % — ABNORMAL LOW (ref 35.0–47.0)
Hemoglobin: 8.2 g/dL — ABNORMAL LOW (ref 12.0–16.0)
LYMPHS PCT: 15 %
Lymphs Abs: 1.8 10*3/uL (ref 1.0–3.6)
MCH: 27.6 pg (ref 26.0–34.0)
MCHC: 31.8 g/dL — ABNORMAL LOW (ref 32.0–36.0)
MCV: 86.9 fL (ref 80.0–100.0)
MONOS PCT: 6 %
Metamyelocytes Relative: 6 %
Monocytes Absolute: 0.7 10*3/uL (ref 0.2–0.9)
Myelocytes: 0 %
NEUTROS ABS: 9.3 10*3/uL — AB (ref 1.4–6.5)
NRBC: 1 /100{WBCs} — AB
Neutrophils Relative %: 72 %
OTHER: 0 %
PROMYELOCYTES RELATIVE: 0 %
Platelets: 444 10*3/uL — ABNORMAL HIGH (ref 150–440)
RBC: 2.95 MIL/uL — ABNORMAL LOW (ref 3.80–5.20)
RDW: 18.4 % — ABNORMAL HIGH (ref 11.5–14.5)
WBC: 11.9 10*3/uL — ABNORMAL HIGH (ref 3.6–11.0)

## 2017-11-27 LAB — PHOSPHORUS: Phosphorus: 4.3 mg/dL (ref 2.5–4.6)

## 2017-11-27 LAB — MAGNESIUM: Magnesium: 1.9 mg/dL (ref 1.7–2.4)

## 2017-11-27 LAB — GLUCOSE, CAPILLARY
GLUCOSE-CAPILLARY: 102 mg/dL — AB (ref 70–99)
Glucose-Capillary: 90 mg/dL (ref 70–99)
Glucose-Capillary: 81 mg/dL (ref 70–99)
Glucose-Capillary: 82 mg/dL (ref 70–99)

## 2017-11-27 NOTE — Progress Notes (Signed)
[facility] at [place] NAME: Michelle [name]    MR#:  [id number]  DATE OF BIRTH:  [birth year]  SUBJECTIVE:  Currently on tracheostomy with ventilator support REVIEW OF SYSTEMS:    ROS Unable to obtain secondary to critical illness.  DRUG ALLERGIES:   Allergies  Allergen Reactions  . Carvedilol Other (See Comments)    Pt gets "sick" when on medication    VITALS:  Blood pressure 120/86, pulse (!) 105, temperature 99.1 F (37.3 C), temperature source Oral, resp. rate (!) 23, height 4\' 9"  (1.448 m), weight 66.1 kg (145 lb 11.6 oz), last menstrual period 03/06/2005, SpO2 93 %.  PHYSICAL EXAMINATION:   Physical Exam  GENERAL:  57 y.o.-year-old patient lying in the bed with tracheostomy on ventilator support EYES: Pupils equal, round, reactive to light and accommodation. No scleral icterus. Extraocular muscles intact.  HEENT: Head atraumatic, normocephalic.  Oropharynx clear NECK:  Supple, no jugular venous distention. No thyroid enlargement, no tenderness.  Has tracheostomy with vent support FIO2 30 % Peep 5 Rate 18 TV 450 ml LUNGS: Decreased breath sounds bilaterally, rales heard in both lungs. No use of accessory muscles of respiration.  CARDIOVASCULAR: S1, S2 normal. No murmurs, rubs, or gallops.  ABDOMEN: Soft, nontender, nondistended. Bowel sounds present. No organomegaly or mass.  Peg tube present EXTREMITIES: No cyanosis, clubbing or edema b/l.    NEUROLOGIC: Not oriented to time, place and person PSYCHIATRIC: could not be assessed SKIN: Has decubitus ulcer  LABORATORY PANEL:   CBC Recent Labs  Lab 11/27/17 0406  WBC 11.9*  HGB 8.2*  HCT 25.7*  PLT 444*   ------------------------------------------------------------------------------------------------------------------ Chemistries  Recent Labs  Lab 11/27/17 0406  NA 139  K 4.4  CL 99  CO2 33*  GLUCOSE 122*  BUN 15  CREATININE <0.30*  CALCIUM 8.6*  MG 1.9    ------------------------------------------------------------------------------------------------------------------  Cardiac Enzymes No results for input(s): TROPONINI in the last 168 hours. ------------------------------------------------------------------------------------------------------------------  RADIOLOGY:  No results found.   ASSESSMENT AND PLAN:  57 year old female patient currently in ICU for respiratory failure, hypotension, pneumonia  - Acute on chronic hypoxic respiratory failure Status post tracheostomy on vent support Currently still on FiO2 of 30% Continue vent per intensivist Unable to wean., working on arrangements of vent at home.   - Health Care Associated Pneumonia Tracheal aspirates : Staph Aureus On IV cefazolin abx   - Hypotension  intermittently hypotensive  Pressors as needed  - Critical illness polyneuropathy  -Acute on chronic COPD exacerbation Continue nebulization treatments  -Anxiety disorder anxiolytic medications  -Stage II decubitus ulcer Wound care protocol as per nursing staff  CM for d/c planning.    All the records are reviewed and case discussed with Care Management/Social Worker. Management plans discussed with the patient, family and they are in agreement.  CODE STATUS: Partial  DVT Prophylaxis: SCDs  TOTAL TIME TAKING CARE OF THIS PATIENT: 34 minutes.     [name] M.D on 11/27/2017 at 10:18 AM  Between 7am to 6pm - Pager - [phone]  After 6pm go to [email or web address] - password [email or web address] [place] [facility]  Office  [phone]  CC: Primary care physician; Center, [facility]  Note: This dictation was prepared with Dragon dictation along with smaller phrase technology. Any transcriptional errors that result from this process are unintentional.

## 2017-11-27 NOTE — Progress Notes (Signed)
Remains on vent Unable to wean Case discussed with care team-plan for d/c home with home health nursing Follows simple commands  Vitals:   11/27/17 0849 11/27/17 0900 11/27/17 1000 11/27/17 1100  BP:  120/86 113/81 108/82  Pulse:  (!) 105 99 94  Resp:  (!) 23 20 17   Temp: 99.1 F (37.3 C)     TempSrc: Oral     SpO2:  93% 95% 96%  Weight:      Height:      NAD, RASS 0 HEENT: NCAT, sclerae white No JVD noted Diminished breath sounds, no wheezes RRR, no M Abdomen soft, NT, NABS BUE, BLE pitting edema- decreased Diffuse profound weakness, no focal deficits    Vanc 06/16 >> 06/18 Cefepime 06/16 >> 06/18 Cefazolin 06/18 >>   CXR: No new film   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  [facility]  Medical Director [place] Director [place] [facility]

## 2017-11-27 NOTE — Progress Notes (Signed)
Attempted Pressure support trial (10/5) with patient, patient became agitated with RR's in the 40's, satss dropping into the mid to upper 80's, and ETCO2 rising into the 60's. Placed patient placed  back into previous PRVC mode.

## 2017-11-27 NOTE — Progress Notes (Signed)
[facility] [place] [facility] at [place] NAME: Michelle [name]    MR#:  [id number]  DATE OF BIRTH:  03-14-1961  SUBJECTIVE:  Currently on tracheostomy with ventilator support REVIEW OF SYSTEMS:    ROS Unable to obtain secondary to critical illness.  DRUG ALLERGIES:   Allergies  Allergen Reactions  . Carvedilol Other (See Comments)    Pt gets "sick" when on medication    VITALS:  Blood pressure (!) 82/61, pulse 87, temperature (!) 101.1 F (38.4 C), temperature source Axillary, resp. rate 17, height 4\' 9"  (1.448 m), weight 66.1 kg (145 lb 11.6 oz), last menstrual period 03/06/2005, SpO2 96 %.  PHYSICAL EXAMINATION:   Physical Exam  GENERAL:  57 y.o.-year-old patient lying in the bed with tracheostomy on ventilator support EYES: Pupils equal, round, reactive to light and accommodation. No scleral icterus. Extraocular muscles intact.  HEENT: Head atraumatic, normocephalic.  Oropharynx clear NECK:  Supple, no jugular venous distention. No thyroid enlargement, no tenderness.  Has tracheostomy with vent support FIO2 30 % Peep 5 Rate 18 TV 450 ml LUNGS: Decreased breath sounds bilaterally, rales heard in both lungs. No use of accessory muscles of respiration.  CARDIOVASCULAR: S1, S2 normal. No murmurs, rubs, or gallops.  ABDOMEN: Soft, nontender, nondistended. Bowel sounds present. No organomegaly or mass.  Peg tube present EXTREMITIES: No cyanosis, clubbing or edema b/l.    NEUROLOGIC: Not oriented to time, place and person PSYCHIATRIC: could not be assessed SKIN: Has decubitus ulcer  LABORATORY PANEL:   CBC Recent Labs  Lab 11/27/17 0406  WBC 11.9*  HGB 8.2*  HCT 25.7*  PLT 444*   ------------------------------------------------------------------------------------------------------------------ Chemistries  Recent Labs  Lab 11/27/17 0406  NA 139  K 4.4  CL 99  CO2 33*  GLUCOSE 122*  BUN 15  CREATININE <0.30*  CALCIUM 8.6*  MG 1.9    ------------------------------------------------------------------------------------------------------------------  Cardiac Enzymes No results for input(s): TROPONINI in the last 168 hours. ------------------------------------------------------------------------------------------------------------------  RADIOLOGY:  No results found.   ASSESSMENT AND PLAN:  57 year old female patient currently in ICU for respiratory failure, hypotension, pneumonia  - Acute on chronic hypoxic respiratory failure Status post tracheostomy on vent support Currently still on FiO2 of 30% Continue vent per intensivist Unable to wean., working on arrangements of vent at home.   - Health Care Associated Pneumonia Tracheal aspirates : Staph Aureus Finished Abx course,  - Hypotension  intermittently hypotensive  Pressors as needed   Stable now,.  - Critical illness polyneuropathy  -Acute on chronic COPD exacerbation Continue nebulization treatments  -Anxiety disorder anxiolytic medications  -Stage II decubitus ulcer Wound care protocol as per nursing staff  CM for d/c planning.    All the records are reviewed and case discussed with Care Management/Social Worker. Management plans discussed with the patient, family and they are in agreement.  CODE STATUS: Partial  DVT Prophylaxis: SCDs  TOTAL TIME TAKING CARE OF THIS PATIENT: 34 minutes.   [name] M.D on 11/27/2017 at 4:58 PM  Between 7am to 6pm - Pager - [phone]  After 6pm go to [email or web address] - password [email or web address] [place] [facility]  Office  [phone]  CC: Primary care physician; Center, [facility]  Note: This dictation was prepared with Dragon dictation along with smaller phrase technology. Any transcriptional errors that result from this process are unintentional.

## 2017-11-27 NOTE — Progress Notes (Signed)
Physical Therapy Treatment Patient Details Name: Michelle [name] MRN: [id number] DOB: 1960-08-18 Today's Date: 11/27/2017    History of Present Illness Pt is a 57 y/o F who presented with severe SOB with COPD exacerbation.  Chest x-ray was clear, patient was slightly hypotensive after intubation, admitted to the ICU.  Trach tube placed 5/16.  Pt demonstrated seizure like activity on 5/23, MRI negative, EEG abnormal but no epileptiform activity.  Pt's PMH includes cocaine abuse, COPD, CAD, hepatitis C, CHF.      PT Comments    PT attempted treatment this afternoon. Pt is very lethargic at this time. Pt inconsistently follows commands, has to be encouraged to open her eyes several times, pt able to initiate gripping motion when asked. Pt unable to perform AROM on command of LE other than initiating motion of toes. Pts PROM of LE assessed and continues to be decreased including DF to less than neutral. PROM of B ankles, knees, and hips performed without change and symptoms or grimacing. Pt could benefit from continued skilled therapy at this time to improve deficits toward PLOF. PT will attempt one more treatment session to see if pt is demonstrating improvements. D/c recommendations continue to be vent SNF.   Follow Up Recommendations  SNF(vent SNF)     Equipment Recommendations       Recommendations for Other Services       Precautions / Restrictions Precautions Precautions: Fall;Other (comment) Precaution Comments: trach, NG tube, PICC line Restrictions Weight Bearing Restrictions: No    Mobility  Bed Mobility Overal bed mobility: Needs Assistance             General bed mobility comments: Not formally assessed due to pts level of alertness. Due to pts level of gross deconditioning pt would likely be total assist for bed mobility  Transfers                 General transfer comment: Not formally assessed due to pts level of alertness. Due to pts level of gross  deconditioning pt would likely be total assist for transfers  Ambulation/Gait                 Stairs             Wheelchair Mobility    Modified Rankin (Stroke Patients Only)       Balance                                            Cognition Arousal/Alertness: Lethargic Behavior During Therapy: Flat affect Overall Cognitive Status: Difficult to assess                                 General Comments: Pt responds little. Very lethargic/falling asleep      Exercises Other Exercises Other Exercises: B LE PROM at ankle, knee and hip x 10. attempted supine ther-ex including ankle pumps and SLR however pt unable to participate in active ther-ex.    General Comments        Pertinent Vitals/Pain Pain Assessment: (unable to report, facial expressions same throughout)    Home Living                      Prior Function  PT Goals (current goals can now be found in the care plan section) Acute Rehab PT Goals Patient Stated Goal: unable to state PT Goal Formulation: Patient unable to participate in goal setting Time For Goal Achievement: 12/09/17 Potential to Achieve Goals: Poor Progress towards PT goals: Progressing toward goals    Frequency    Min 2X/week      PT Plan Current plan remains appropriate    Co-evaluation              AM-PAC PT "6 Clicks" Daily Activity  Outcome Measure  Difficulty turning over in bed (including adjusting bedclothes, sheets and blankets)?: Unable Difficulty moving from lying on back to sitting on the side of the bed? : Unable Difficulty sitting down on and standing up from a chair with arms (e.g., wheelchair, bedside commode, etc,.)?: Unable Help needed moving to and from a bed to chair (including a wheelchair)?: Total Help needed walking in hospital room?: Total Help needed climbing 3-5 steps with a railing? : Total 6 Click Score: 6    End of Session    Activity Tolerance: Patient limited by lethargy Patient left: in bed;with call bell/phone within reach;with bed alarm set Nurse Communication: Other (comment)(pt requiring hygeine at this time) PT Visit Diagnosis: Muscle weakness (generalized) (M62.81);Difficulty in walking, not elsewhere classified (R26.2)     Time: 0177-9390 PT Time Calculation (min) (ACUTE ONLY): 9 min  Charges:  $Therapeutic Exercise: 8-22 mins                    G Codes:       Mckinnon Glick, SPT    Dorthia Tout 11/27/2017, 4:56 PM

## 2017-11-27 NOTE — Care Management (Addendum)
Patients home has failed assessment for safety by [facility]. They will follow progression of home renovations to move patient to a safe environment. RNCM did not hear back from [facility] to see if they will follow when patient does return home. RNCM has reached back out to them [phone]- sent to nurse Bell's voice mail/ nurse line.

## 2017-11-28 LAB — GLUCOSE, CAPILLARY
GLUCOSE-CAPILLARY: 82 mg/dL (ref 70–99)
GLUCOSE-CAPILLARY: 91 mg/dL (ref 70–99)
GLUCOSE-CAPILLARY: 89 mg/dL (ref 70–99)
Glucose-Capillary: 86 mg/dL (ref 70–99)

## 2017-11-28 NOTE — Progress Notes (Signed)
CC follow up resp failure  HPI On vent No acute events Not weaning from vent Needs vent to survive Tolerating TF's  Vitals:   11/28/17 0700 11/28/17 0720 11/28/17 0800 11/28/17 0822  BP:  104/79 97/79   Pulse: 94 90 95   Resp: 20 18 (!) 27   Temp:  99.9 F (37.7 C)    TempSrc:  Oral    SpO2: 96% 97% 96% 96%  Weight:      Height:      NAD,  HEENT: NCAT, sclerae white S/p trach No JVD noted Diminished breath sounds, no wheezes RRR, no M Abdomen soft, NT, NABS S/p PEG tube  BUE, BLE pitting edema- decreased Diffuse profound weakness, no focal deficits    Vanc 06/16 >> 06/18 Cefepime 06/16 >> 06/18 Cefazolin 06/18 >>   CXR: No new film   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  [facility]  Medical Director [place] Director [facility]

## 2017-11-28 NOTE — Progress Notes (Signed)
Nutrition Follow-up  DOCUMENTATION CODES:   Non-severe (moderate) malnutrition in context of chronic illness  INTERVENTION:  Continue Jevity 1.5 Cal at 35 mL/hr (840 mL goal daily volume) + Pro-Stat 30 mL BID per G-tube. Provides 1460 kcal, 84 grams of protein, 18.5 grams of fiber, 638 mL H2O daily.  Continue liquid MVI daily per tube.  With free water flush 50 mL Q8hrs patient is receiving a total of 788 mL H2O including water in tube feeding. She will also receive approximately 180 mL fluid with Pro-Stat provision.  NUTRITION DIAGNOSIS:   Moderate Malnutrition related to chronic illness(COPD, CHF, hepatitis C, cocaine abuse) as evidenced by mild fat depletion, moderate fat depletion, mild muscle depletion, moderate muscle depletion.  Ongoing - addressing with TF management.  GOAL:   Provide needs based on ASPEN/SCCM guidelines  Met with TF regimen.  MONITOR:   Vent status, Labs, Weight trends, TF tolerance, I & O's  REASON FOR ASSESSMENT:   Ventilator    ASSESSMENT:   57 year old female with PMHx of emphysema, COPD, HTN, hypercholesterolemia, hx pneumothorax s/p chest tube insertion, CAD s/p cardiac catheterization, cocaine abuse, tobacco abuse, asthma, CHF, anxiety, hepatitis C admitted with acute exacerbation of COPD, acute on chronic respiratory failure requiring intubation 4/29, PNA, cocaine intoxication, AKI due to renal hypoperfusion.   -Patient is s/p tracheostomy tube placement on 5/16. OGT was replaced with NGT. -Patient s/p placement of 20 Fr. Pull-through gastrostomy tube on 5/30 by IR. -Per [place] RN documentation on 6/12 the device related pressure injury at trach has now healed as no open wounds were found. -Family still wants to take patient home instead of to a vent/SNF. Per chart they are aware 24/7 care will need to be provided. Pending repairs to home before patient can be taken home.  Patient remains ventilated through tracheostomy tube. Currently in [facility]  mode with FiO2 35%. Edema has been improving. She now has moderate pitting edema to bilateral upper extremities and mild pitting edema to bilateral lower extremities.  Access: 20 Fr. G-tube placed on 5/30 by IR  TF: pt tolerating Jevity 1.5 Cal at 35 mL/hr + Pro-Stat 30 mL BID  Patient is currently intubated on ventilator support Ve: 8.1 L/min Temp (24hrs), Avg:100 F (37.8 C), Min:99.2 F (37.3 C), Max:101.1 F (38.4 C)  Propofol: N/A  Medications reviewed and include: clonazepam, famotidine, fentanyl patch, free water flush 50 mL Q8hrs, liquid MVI daily per tube, potassium chloride 20 mEq BID per tube, Seroquel per tube, senna-docusate 1 tablet BID per tube, valproic acid per tube.  Labs reviewed: CBG 82-102 past 24 hrs. On 6/26 CO2 33, Creatinine <0.3.  I/O: 950 mL UOP yesterday (0.6 mL/kg/hr); 2 BM yesterday  Weight trend: 66.1 kg on 6/26; +20.4 kg from admission  Discussed with RN and on rounds.  Diet Order:   Diet Order    None      EDUCATION NEEDS:   No education needs have been identified at this time  Skin:  Skin Assessment: Skin Integrity Issues: Skin Integrity Issues:: Stage II, Incisions, Other (Comment) Stage II: medial sacrum (epithelialized) Incisions: N/A Other: device related pressure injury has healed now; MSAD to groin, thigh, vagina; ecchymosis to abdomen and bilateral legs  Last BM:  11/28/2017 - medium type 6  Height:   Ht Readings from Last 1 Encounters:  10/01/17 4' 9"  (1.448 m)    Weight:   Wt Readings from Last 1 Encounters:  11/27/17 145 lb 11.6 oz (66.1 kg)  Ideal Body Weight:  43.2 kg  BMI:  Body mass index is 31.53 kg/m.  Estimated Nutritional Needs:   Kcal:  1150-1370 (25-30 kcal/kg)  Protein:  70-93 grams (1.5-2 grams/kg)  Fluid:  1.4-1.6 L/day (30-35 mL/kg)  Willey Blade, MS, RD, LDN Office: [phone] Pager: [phone] After Hours/Weekend Pager: [phone]

## 2017-11-28 NOTE — Progress Notes (Signed)
[facility] at [place] NAME: Leila Schuff    MR#:  [id number]  DATE OF BIRTH:  [birth year]  SUBJECTIVE:  Currently on tracheostomy with ventilator support REVIEW OF SYSTEMS:    ROS Unable to obtain secondary to critical illness.  DRUG ALLERGIES:   Allergies  Allergen Reactions  . Carvedilol Other (See Comments)    Pt gets "sick" when on medication    VITALS:  Blood pressure 96/69, pulse (!) 112, temperature 99.7 F (37.6 C), temperature source Oral, resp. rate 17, height 4\' 9"  (1.448 m), weight 66.1 kg (145 lb 11.6 oz), last menstrual period 03/06/2005, SpO2 97 %.  PHYSICAL EXAMINATION:   Physical Exam  GENERAL:  57 y.o.-year-old patient lying in the bed with tracheostomy on ventilator support EYES: Pupils equal, round, reactive to light and accommodation. No scleral icterus. Extraocular muscles intact.  HEENT: Head atraumatic, normocephalic.  Oropharynx clear NECK:  Supple, no jugular venous distention. No thyroid enlargement, no tenderness.  Has tracheostomy with vent support FIO2 30 % Peep 5 Rate 18 TV 450 ml LUNGS: Decreased breath sounds bilaterally, rales heard in both lungs. No use of accessory muscles of respiration.  CARDIOVASCULAR: S1, S2 normal. No murmurs, rubs, or gallops.  ABDOMEN: Soft, nontender, nondistended. Bowel sounds present. No organomegaly or mass.  Peg tube present EXTREMITIES: No cyanosis, clubbing or edema b/l.    NEUROLOGIC: Not oriented to time, place and person PSYCHIATRIC: could not be assessed SKIN: Has decubitus ulcer  LABORATORY PANEL:   CBC Recent Labs  Lab 11/27/17 0406  WBC 11.9*  HGB 8.2*  HCT 25.7*  PLT 444*   ------------------------------------------------------------------------------------------------------------------ Chemistries  Recent Labs  Lab 11/27/17 0406  NA 139  K 4.4  CL 99  CO2 33*  GLUCOSE 122*  BUN 15  CREATININE <0.30*  CALCIUM 8.6*  MG 1.9    ------------------------------------------------------------------------------------------------------------------  Cardiac Enzymes No results for input(s): TROPONINI in the last 168 hours. ------------------------------------------------------------------------------------------------------------------  RADIOLOGY:  No results found.   ASSESSMENT AND PLAN:  57 year old female patient currently in ICU for respiratory failure, hypotension, pneumonia  - Acute on chronic hypoxic respiratory failure Status post tracheostomy on vent support Currently still on FiO2 of 30% Continue vent per intensivist Unable to wean., working on arrangements of vent at home.   - Health Care Associated Pneumonia Tracheal aspirates : Staph Aureus Finished Abx course,  - Hypotension  intermittently hypotensive  Pressors as needed   Stable now,.  - Critical illness polyneuropathy  -Acute on chronic COPD exacerbation Continue nebulization treatments  -Anxiety disorder anxiolytic medications  -Stage II decubitus ulcer Wound care protocol as per nursing staff  CM for d/c planning.    All the records are reviewed and case discussed with Care Management/Social Worker. Management plans discussed with the patient, family and they are in agreement.  CODE STATUS: Partial  DVT Prophylaxis: SCDs  TOTAL TIME TAKING CARE OF THIS PATIENT: 30 minutes.   [name] M.D on 11/28/2017 at 10:10 PM  Between 7am to 6pm - Pager - [phone]  After 6pm go to [email or web address] - password [email or web address] [place] [facility]  Office  [phone]  CC: Primary care physician; Center, [facility]  Note: This dictation was prepared with Dragon dictation along with smaller phrase technology. Any transcriptional errors that result from this process are unintentional.

## 2017-11-29 LAB — CULTURE, BLOOD (ROUTINE X 2)
CULTURE: NO GROWTH
Culture: NO GROWTH
SPECIAL REQUESTS: ADEQUATE
SPECIAL REQUESTS: ADEQUATE

## 2017-11-29 LAB — GLUCOSE, CAPILLARY
GLUCOSE-CAPILLARY: 71 mg/dL (ref 70–99)
GLUCOSE-CAPILLARY: 74 mg/dL (ref 70–99)
GLUCOSE-CAPILLARY: 88 mg/dL (ref 70–99)
GLUCOSE-CAPILLARY: 89 mg/dL (ref 70–99)
GLUCOSE-CAPILLARY: 89 mg/dL (ref 70–99)

## 2017-11-29 MED ORDER — DEXTROSE 50 % IV SOLN: 1.0000 | Freq: Once | INTRAVENOUS | Status: DC

## 2017-11-29 NOTE — Progress Notes (Signed)
   11/29/17 0911  Vent Select  Invasive or Noninvasive Invasive  Adult Vent Y  Tracheostomy Shiley 6 mm Cuffed  Placement Date/Time: 10/17/17 1140   Placed By: (c) Other (Comment)  Brand: Shiley  Size (mm): 6 mm  Style: Cuffed  Serial / Lot #: [id number]  Expiration Date: 12/25/21  Status Secured  Cuff pressure (cm) 17 cm  Tracheostomy Equipment at bedside Yes and checklist posted at head of bed  Adult Ventilator Settings  Vent Type Servo i  Humidity HME  Vent Mode PRVC  Vt Set 450 mL  Ve 7.9 mL  Set Rate 16 bmp  FiO2 (%) 35 %  I Time 0.9 Sec(s)  PEEP 5 cmH20  High PEEP 10 sec  Low PEEP 2 sec  Adult Ventilator Measurements  Peak Airway Pressure 22 L/min  Mean Airway Pressure 9 cmH20  Resp Rate Total 18 br/min  Exhaled Vt 479 mL  Measured Ve 7.9 mL  Total PEEP 5 cmH20  End Tidal CO2 39  SpO2 99 %  Adult Ventilator Alarms  Alarms On Y  Ve High Alarm 20 L/min  Ve Low Alarm 2 L/min  Resp Rate High Alarm 40 br/min  Resp Rate Low Alarm 5  PEEP Low Alarm 2 cmH2O  Press High Alarm 45 cmH2O  VAP Prevention  HME changed Yes  HOB> 30 Degrees Y  Breath Sounds  Bilateral Breath Sounds Clear  R Lower Breath Sounds Diminished  L Lower Breath Sounds Diminished  Airway Suctioning/Secretions  Suction Type Tracheal  Suction Device  Catheter  Secretion Amount Small  Secretion Color Yellow  Secretion Consistency Thick  Suction Tolerance Tolerated well  Suctioning Adverse Effects None

## 2017-11-29 NOTE — Progress Notes (Signed)
Patient continues to tolerate trach collar.  RR 17-18, heart rate 92-93, patient nods head that she is comfortable, and breathing pattern in unlabored.

## 2017-11-29 NOTE — Progress Notes (Signed)
CC follow up resp failure  HPI On vent No acute events Not weaning from vent Needs vent to survive Tolerating TF's  Vitals:   11/29/17 0700 11/29/17 0800 11/29/17 0900 11/29/17 0911  BP: 109/81 104/74 106/82   Pulse: 83 88 86   Resp: 17 15 17    Temp:   99.5 F (37.5 C)   TempSrc:   Oral   SpO2: 100% 100% 99% 99%  Weight:      Height:      NAD,  HEENT: NCAT, sclerae white S/p trach No JVD noted Diminished breath sounds, no wheezes RRR, no M Abdomen soft, NT, NABS S/p PEG tube  BUE, BLE pitting edema- decreased Diffuse profound weakness, no focal deficits    Vanc 06/16 >> 06/18 Cefepime 06/16 >> 06/18 Cefazolin 06/18 >>   CXR: No new film   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   MRN: [id number]

## 2017-11-29 NOTE — Clinical Social Work Note (Signed)
As directed by clincal social work Surveyor, quantity Michelle [name], [place] contacted [facility] at [phone] to inquire about Medicaid beds available, left a message waiting for call back.  CSW also contacted [facility] [place] [facility] to see if they have any beds available, CSW left message on voice mail waiting for call back.  CSW contacted [facility] vent SNF, and spoke to administrator, and he said if patient's family are agreeable to going to [facility]F, they can accept referral and still have a bed available if patient's family changes their mind.  Jones Broom. Norval Morton, MSW, [place]  11/29/2017 4:37 PM

## 2017-11-29 NOTE — Care Management (Addendum)
Still waiting on home repair to floors and door entry/exit. RNCM received voicemail from [facility] with [facility] [phone] regarding home health. Patient pulmonologist Dr. Patsy Baltimore has been out of the office and has not responded or agreed to following for home health. Bell admits she just sent patient information 11/28/17 to Dr. Patsy Baltimore for review. FiO2 appears to be 35% today; vent mode PRVC. RNCM will follow. Update at 12P: ICU RN updated RNCM that patient's family states floors are completed- unsure about door entrance.  RNCM has sent message to [facility] and [facility] to see if they have an update.  Update at 1222P: RNCM received call from daughter [name] stating that she confused and hopes that plan remains for patient to return to home at discharge. She states that the floors are fixed but need about another week on ramp entrance because the deck was rotten and has to be redone too.  RNCM has updated both [facility] and [facility]- RNCM asked [facility] to reach back to [facility] to confirm 2P appointment for vent training with them today.

## 2017-11-29 NOTE — Progress Notes (Signed)
[place] at [place] NAME: Michelle [name]    MR#:  [id number]  DATE OF BIRTH:  28-Jun-1960  SUBJECTIVE:  Currently on tracheostomy with ventilator support REVIEW OF SYSTEMS:    ROS Unable to obtain secondary to critical illness.  DRUG ALLERGIES:   Allergies  Allergen Reactions  . Carvedilol Other (See Comments)    Pt gets "sick" when on medication    VITALS:  Blood pressure 128/69, pulse 87, temperature 98.9 F (37.2 C), temperature source Oral, resp. rate 11, height 4\' 9"  (1.448 m), weight 66.1 kg (145 lb 11.6 oz), last menstrual period 03/06/2005, SpO2 99 %.  PHYSICAL EXAMINATION:   Physical Exam  GENERAL:  57 y.o.-year-old patient lying in the bed with tracheostomy on ventilator support EYES: Pupils equal, round, reactive to light and accommodation. No scleral icterus. Extraocular muscles intact.  HEENT: Head atraumatic, normocephalic.  Oropharynx clear NECK:  Supple, no jugular venous distention. No thyroid enlargement, no tenderness.  Has tracheostomy with vent support FIO2 30 % LUNGS: Decreased breath sounds bilaterally, rales heard in both lungs. No use of accessory muscles of respiration.  CARDIOVASCULAR: S1, S2 normal. No murmurs, rubs, or gallops.  ABDOMEN: Soft, nontender, nondistended. Bowel sounds present. No organomegaly or mass.  Peg tube present EXTREMITIES: No cyanosis, clubbing or edema b/l.    NEUROLOGIC: Not oriented to time, place and person PSYCHIATRIC: could not be assessed SKIN: Has decubitus ulcer  LABORATORY PANEL:   CBC Recent Labs  Lab 11/27/17 0406  WBC 11.9*  HGB 8.2*  HCT 25.7*  PLT 444*   ------------------------------------------------------------------------------------------------------------------ Chemistries  Recent Labs  Lab 11/27/17 0406  NA 139  K 4.4  CL 99  CO2 33*  GLUCOSE 122*  BUN 15  CREATININE <0.30*  CALCIUM 8.6*  MG 1.9    ------------------------------------------------------------------------------------------------------------------  Cardiac Enzymes No results for input(s): TROPONINI in the last 168 hours. ------------------------------------------------------------------------------------------------------------------  RADIOLOGY:  No results found.   ASSESSMENT AND PLAN:  57 year old female patient currently in ICU for respiratory failure, hypotension, pneumonia  - Acute on chronic hypoxic respiratory failure Status post tracheostomy on vent support Currently still on FiO2 of 30% Continue vent per intensivist Unable to wean., working on arrangements of vent at home.   - Health Care Associated Pneumonia Tracheal aspirates : Staph Aureus Finished Abx course,  - Hypotension    Initially given Pressors as needed   Stable now,.  - Critical illness polyneuropathy  -Acute on chronic COPD exacerbation Continue nebulization treatments  -Anxiety disorder anxiolytic medications  -Stage II decubitus ulcer Wound care protocol as per nursing staff  CM for d/c planning. Family is making arrangements at home to take her home with vent support.  All the records are reviewed and case discussed with Care Management/Social Worker. Management plans discussed with the patient, family and they are in agreement.  CODE STATUS: Partial  DVT Prophylaxis: SCDs  TOTAL TIME TAKING CARE OF THIS PATIENT: 30 minutes.   [name] M.D on 11/29/2017 at 5:17 PM  Between 7am to 6pm - Pager - [phone]  After 6pm go to [email or web address] - password [email or web address] [place] [facility]  Office  [phone]  CC: Primary care physician; Center, [facility]  Note: This dictation was prepared with Dragon dictation along with smaller phrase technology. Any transcriptional errors that result from this process are unintentional.

## 2017-11-29 NOTE — Progress Notes (Signed)
Patient placed on 40% trach collar by Amy with cardiopulmonary.

## 2017-11-30 ENCOUNTER — Inpatient Hospital Stay: Payer: Medicaid Other

## 2017-11-30 LAB — CBC
HCT: 24.1 % — ABNORMAL LOW (ref 35.0–47.0)
Hemoglobin: 8.1 g/dL — ABNORMAL LOW (ref 12.0–16.0)
MCH: 28.8 pg (ref 26.0–34.0)
MCHC: 33.4 g/dL (ref 32.0–36.0)
MCV: 86.2 fL (ref 80.0–100.0)
PLATELETS: 409 10*3/uL (ref 150–440)
RBC: 2.8 MIL/uL — ABNORMAL LOW (ref 3.80–5.20)
RDW: 18.7 % — AB (ref 11.5–14.5)
WBC: 10.3 10*3/uL (ref 3.6–11.0)

## 2017-11-30 LAB — URINALYSIS, COMPLETE (UACMP) WITH MICROSCOPIC
BACTERIA UA: NONE SEEN
BILIRUBIN URINE: NEGATIVE
Glucose, UA: NEGATIVE mg/dL
Hgb urine dipstick: NEGATIVE
Ketones, ur: NEGATIVE mg/dL
Leukocytes, UA: NEGATIVE
NITRITE: NEGATIVE
PH: 9 — AB (ref 5.0–8.0)
Protein, ur: NEGATIVE mg/dL
SPECIFIC GRAVITY, URINE: 1.01 (ref 1.005–1.030)
Squamous Epithelial / LPF: NONE SEEN (ref 0–5)

## 2017-11-30 LAB — COMPREHENSIVE METABOLIC PANEL
ALT: 6 U/L (ref 0–44)
ANION GAP: 6 (ref 5–15)
AST: 18 U/L (ref 15–41)
Albumin: 1.8 g/dL — ABNORMAL LOW (ref 3.5–5.0)
Alkaline Phosphatase: 47 U/L (ref 38–126)
BUN: 15 mg/dL (ref 6–20)
CALCIUM: 8.8 mg/dL — AB (ref 8.9–10.3)
CHLORIDE: 100 mmol/L (ref 98–111)
CO2: 33 mmol/L — AB (ref 22–32)
Glucose, Bld: 103 mg/dL — ABNORMAL HIGH (ref 70–99)
Potassium: 4.3 mmol/L (ref 3.5–5.1)
SODIUM: 139 mmol/L (ref 135–145)
Total Bilirubin: 0.2 mg/dL — ABNORMAL LOW (ref 0.3–1.2)
Total Protein: 5.9 g/dL — ABNORMAL LOW (ref 6.5–8.1)

## 2017-11-30 LAB — MAGNESIUM: MAGNESIUM: 1.7 mg/dL (ref 1.7–2.4)

## 2017-11-30 LAB — PHOSPHORUS: PHOSPHORUS: 5 mg/dL — AB (ref 2.5–4.6)

## 2017-11-30 LAB — GLUCOSE, CAPILLARY
GLUCOSE-CAPILLARY: 89 mg/dL (ref 70–99)
GLUCOSE-CAPILLARY: 77 mg/dL (ref 70–99)
Glucose-Capillary: 74 mg/dL (ref 70–99)

## 2017-11-30 LAB — MRSA PCR SCREENING: MRSA BY PCR: NEGATIVE

## 2017-11-30 NOTE — Progress Notes (Signed)
Pt weaned trach collar  40% 11 1/2 hr

## 2017-11-30 NOTE — Progress Notes (Signed)
[place] at [place] NAME: Michelle [name]    MR#:  [id number]  DATE OF BIRTH:  [birth year]  SUBJECTIVE:   No acute events overnight.  Remains on the Vent. S/p Trach, Peg.   REVIEW OF SYSTEMS:    Review of Systems  Unable to perform ROS: Intubated    Nutrition: tube feeds Tolerating Diet: Yes Tolerating PT: Bedbound  DRUG ALLERGIES:   Allergies  Allergen Reactions  . Carvedilol Other (See Comments)    Pt gets "sick" when on medication    VITALS:  Blood pressure 100/65, pulse 93, temperature 100 F (37.8 C), temperature source Axillary, resp. rate 15, height 4\' 9"  (1.448 m), weight 60.4 kg (133 lb 2.5 oz), last menstrual period 03/06/2005, SpO2 98 %.  PHYSICAL EXAMINATION:   Physical Exam  GENERAL:  57 y.o.-year-old patient lying in bed s/p Trach and connected to vent.  Follows simple commands.  EYES: Pupils equal, round, reactive to light and accommodation. No scleral icterus. Extraocular muscles intact.  HEENT: Head atraumatic, normocephalic. Oropharynx and nasopharynx clear.  NECK:  Supple, no jugular venous distention. No thyroid enlargement, no tenderness.  LUNGS: Normal breath sounds bilaterally, no wheezing, rales, rhonchi. No use of accessory muscles of respiration.  CARDIOVASCULAR: S1, S2 normal. No murmurs, rubs, or gallops.  ABDOMEN: Soft, nontender, nondistended. Bowel sounds present. No organomegaly or mass.  EXTREMITIES: No cyanosis, clubbing or edema b/l.    NEUROLOGIC: Cranial nerves II through XII are intact. No focal Motor or sensory deficits b/l.  Globally weak but follows simple commands PSYCHIATRIC: The patient is alert and oriented x 1.  SKIN: No obvious rash, lesion, or ulcer.    LABORATORY PANEL:   CBC Recent Labs  Lab 11/30/17 0519  WBC 10.3  HGB 8.1*  HCT 24.1*  PLT 409    ------------------------------------------------------------------------------------------------------------------  Chemistries  Recent Labs  Lab 11/30/17 0519  NA 139  K 4.3  CL 100  CO2 33*  GLUCOSE 103*  BUN 15  CREATININE <0.30*  CALCIUM 8.8*  MG 1.7  AST 18  ALT 6  ALKPHOS 47  BILITOT 0.2*   ------------------------------------------------------------------------------------------------------------------  Cardiac Enzymes No results for input(s): TROPONINI in the last 168 hours. ------------------------------------------------------------------------------------------------------------------  RADIOLOGY:  Dg Chest Port 1 View  Result Date: 11/30/2017 CLINICAL DATA:  Acute respiratory failure EXAM: PORTABLE CHEST 1 VIEW COMPARISON:  Eight days ago FINDINGS: Trace bilateral pleural effusion as seen previously. There is generalized interstitial coarsening also parenchymal opacity behind the heart. The left lower lobe was clear on a chest CT 3 months ago. No pneumothorax. Right upper extremity PICC in good position with tip at the SVC. A tracheostomy tube is well seated. Artifact from EKG leads. IMPRESSION: 1. Stable compared to 11/22/2017. 2. Trace effusions and interstitial coarsening from mild edema or patient's COPD. 3. Asymmetric retrocardiac opacity that could be infection or atelectasis. Electronically Signed   By: Monte Fantasia M.D.   On: 11/30/2017 07:34     ASSESSMENT AND PLAN:   57 year old female patient currently in ICU for respiratory failure, hypotension, pneumonia  ** Acute on chronic hypoxic respiratory failure - Status post tracheostomy on vent support - Currently still on FiO2 of 30% - Continue vent weaning per intensivist  ** Health Care Associated Pneumonia - Tracheal aspirates : Staph Aureus - Finished Abx course,  ** Hypotension - was on vasopressors but now weaned off it.    Stable now  ** Critical illness polyneuropathy - cont.  Supportive care  **  Acute on chronic COPD exacerbation - stable. Cont. scheduled duo nebs, Pulmicort nebs.  **Anxiety - Continue Xanax, Klonopin.  ** Stage II decubitus ulcer - sacral.  - cont. Local wound care as per wound care nurse.    All the records are reviewed and case discussed with Care Management/Social Worker. Management plans discussed with the patient, family and they are in agreement.  CODE STATUS: Partial code  DVT Prophylaxis: Lovenox  TOTAL TIME TAKING CARE OF THIS PATIENT: 30 minutes.   POSSIBLE D/C unclear, DEPENDING ON CLINICAL CONDITION.   Henreitta Leber M.D on 11/30/2017 at 2:07 PM  Between 7am to 6pm - Pager - [phone]  After 6pm go to [email or web address] - [place] [facility]  Office  [phone]  CC: Primary care physician; Center, [facility]

## 2017-11-30 NOTE — Progress Notes (Signed)
Pt placed on HR trach collar at 40%, Cuff is deflated,  tolerating well at this time, RN notified, will continue to monitor.

## 2017-11-30 NOTE — Progress Notes (Signed)
CC follow up resp failure  HPI On vent No acute events Not weaning from vent Needs vent to survive Tolerating TF's  Vitals:   11/30/17 0500 11/30/17 0600 11/30/17 0700 11/30/17 0800  BP: 94/71 94/62 99/67  (!) 124/92  Pulse: 92 91 86 95  Resp: 16 17 16  (!) 22  Temp: 99.1 F (37.3 C)   99.5 F (37.5 C)  TempSrc: Oral   Oral  SpO2: 94% 96% 97% 98%  Weight: 133 lb 2.5 oz (60.4 kg)     Height:      NAD,  HEENT: NCAT, sclerae white S/p trach No JVD noted Diminished breath sounds, no wheezes RRR, no M Abdomen soft, NT, NABS S/p PEG tube  BUE, BLE pitting edema- decreased Diffuse profound weakness, no focal deficits    Vanc 06/16 >> 06/18 Cefepime 06/16 >> 06/18 Cefazolin 06/18 >>   CXR: No new film   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   MRN: [id number] Patient ID: Michelle [name], female   DOB: [birth year], 57 y.o.   MRN: [id number]

## 2017-11-30 NOTE — Progress Notes (Signed)
Pt remains on high flow Trach collar, tolerating well, sats 96%, HR 95/min, respiratory rate 18/min

## 2017-12-01 LAB — GLUCOSE, CAPILLARY
GLUCOSE-CAPILLARY: 93 mg/dL (ref 70–99)
Glucose-Capillary: 104 mg/dL — ABNORMAL HIGH (ref 70–99)
Glucose-Capillary: 118 mg/dL — ABNORMAL HIGH (ref 70–99)

## 2017-12-01 NOTE — Progress Notes (Signed)
CC follow up resp failure  HPI On vent No acute events Not weaning from vent Needs vent to survive Tolerating TF's  Vitals:   12/01/17 0500 12/01/17 0600 12/01/17 0700 12/01/17 0800  BP: 97/68 102/70 100/71 (!) 144/85  Pulse: 90 88 84 (!) 114  Resp: 16 16 16  (!) 24  Temp:    99 F (37.2 C)  TempSrc:    Axillary  SpO2: 100% 100% 99% 98%  Weight:      Height:      NAD,  HEENT: NCAT, sclerae white S/p trach No JVD noted Diminished breath sounds, no wheezes RRR, no M Abdomen soft, NT, NABS S/p PEG tube  BUE, BLE pitting edema- decreased Diffuse profound weakness, no focal deficits    Vanc 06/16 >> 06/18 Cefepime 06/16 >> 06/18 Cefazolin 06/18 >>   CXR: No new film   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   MRN: [id number] Patient ID: Michelle [name], female   DOB: 01-07-1961, 57 y.o.   MRN: [id number] Patient ID: Michelle [name], female   DOB: 16-Jan-1961, 57 y.o.   MRN: [id number]

## 2017-12-01 NOTE — Progress Notes (Signed)
[place] at [place] NAME: Michelle [name]    MR#:  [id number]  DATE OF BIRTH:  01-09-1961  SUBJECTIVE:   No acute events overnight.  Remains on the Vent. S/p Trach, Peg.  Still having some Thick Tracheal secretions.   Was able to Tolerate Trach collar for 9 hrs yesterday but back on vent now.   REVIEW OF SYSTEMS:    Review of Systems  Unable to perform ROS: Intubated    Nutrition: tube feeds Tolerating Diet: Yes Tolerating PT: Bedbound  DRUG ALLERGIES:   Allergies  Allergen Reactions  . Carvedilol Other (See Comments)    Pt gets "sick" when on medication    VITALS:  Blood pressure 115/68, pulse 87, temperature 99.4 F (37.4 C), temperature source Axillary, resp. rate 15, height 4\' 9"  (1.448 m), weight 58.1 kg (128 lb 1.4 oz), last menstrual period 03/06/2005, SpO2 99 %.  PHYSICAL EXAMINATION:   Physical Exam  GENERAL:  57 y.o.-year-old patient lying in bed s/p Trach and connected to vent.  Follows simple commands.  EYES: Pupils equal, round, reactive to light and accommodation. No scleral icterus. Extraocular muscles intact.  HEENT: Head atraumatic, normocephalic. Oropharynx and nasopharynx clear.  NECK:  Supple, no jugular venous distention. No thyroid enlargement, no tenderness.  LUNGS: Normal breath sounds bilaterally, no wheezing, rales, rhonchi. No use of accessory muscles of respiration.  CARDIOVASCULAR: S1, S2 normal. No murmurs, rubs, or gallops.  ABDOMEN: Soft, nontender, nondistended. Bowel sounds present. No organomegaly or mass.  EXTREMITIES: No cyanosis, clubbing , +1-2 dependent edema b/l  NEUROLOGIC: Cranial nerves II through XII are intact. No focal Motor or sensory deficits b/l.  Globally weak but follows simple commands PSYCHIATRIC: The patient is alert and oriented x 1.  SKIN: No obvious rash, lesion, or ulcer.    LABORATORY PANEL:   CBC Recent Labs  Lab 11/30/17 0519  WBC 10.3  HGB 8.1*  HCT 24.1*   PLT 409   ------------------------------------------------------------------------------------------------------------------  Chemistries  Recent Labs  Lab 11/30/17 0519  NA 139  K 4.3  CL 100  CO2 33*  GLUCOSE 103*  BUN 15  CREATININE <0.30*  CALCIUM 8.8*  MG 1.7  AST 18  ALT 6  ALKPHOS 47  BILITOT 0.2*   ------------------------------------------------------------------------------------------------------------------  Cardiac Enzymes No results for input(s): TROPONINI in the last 168 hours. ------------------------------------------------------------------------------------------------------------------  RADIOLOGY:  Dg Chest Port 1 View  Result Date: 11/30/2017 CLINICAL DATA:  Acute respiratory failure EXAM: PORTABLE CHEST 1 VIEW COMPARISON:  Eight days ago FINDINGS: Trace bilateral pleural effusion as seen previously. There is generalized interstitial coarsening also parenchymal opacity behind the heart. The left lower lobe was clear on a chest CT 3 months ago. No pneumothorax. Right upper extremity PICC in good position with tip at the SVC. A tracheostomy tube is well seated. Artifact from EKG leads. IMPRESSION: 1. Stable compared to 11/22/2017. 2. Trace effusions and interstitial coarsening from mild edema or patient's COPD. 3. Asymmetric retrocardiac opacity that could be infection or atelectasis. Electronically Signed   By: Monte Fantasia M.D.   On: 11/30/2017 07:34     ASSESSMENT AND PLAN:   57 year old female patient currently in ICU for respiratory failure, hypotension, pneumonia  ** Acute on chronic hypoxic respiratory failure - Status post tracheostomy on vent support - Currently still on FiO2 of 30%. Was able to tolerated Trach collar for 9 hrs yesterday.  - Continue vent weaning per intensivist  ** Health Care Associated Pneumonia - Tracheal  aspirates growing Staph Aureus - Finished Abx course,  ** Hypotension - was on vasopressors but now weaned  off it.    Stable now  ** Critical illness polyneuropathy - cont. Supportive care  ** Acute on chronic COPD exacerbation - stable. Cont. scheduled duo nebs, Pulmicort nebs.  **Anxiety - Continue Xanax, Klonopin.  ** Stage II decubitus ulcer - sacral.  - cont. Local wound care as per wound care nurse.    All the records are reviewed and case discussed with Care Management/Social Worker. Management plans discussed with the patient, family and they are in agreement.  CODE STATUS: Partial code  DVT Prophylaxis: Lovenox  TOTAL TIME TAKING CARE OF THIS PATIENT: 25 minutes.   POSSIBLE D/C unclear, DEPENDING ON CLINICAL CONDITION.   Henreitta Leber M.D on 12/01/2017 at 2:22 PM  Between 7am to 6pm - Pager - [phone]  After 6pm go to [email or web address] - [place] [facility]  Office  [phone]  CC: Primary care physician; Center, [facility]

## 2017-12-02 LAB — BASIC METABOLIC PANEL
Anion gap: 5 (ref 5–15)
BUN: 14 mg/dL (ref 6–20)
CALCIUM: 8.8 mg/dL — AB (ref 8.9–10.3)
CO2: 34 mmol/L — ABNORMAL HIGH (ref 22–32)
Chloride: 99 mmol/L (ref 98–111)
GLUCOSE: 109 mg/dL — AB (ref 70–99)
Potassium: 4.6 mmol/L (ref 3.5–5.1)
SODIUM: 138 mmol/L (ref 135–145)

## 2017-12-02 LAB — GLUCOSE, CAPILLARY
GLUCOSE-CAPILLARY: 100 mg/dL — AB (ref 70–99)
GLUCOSE-CAPILLARY: 100 mg/dL — AB (ref 70–99)
GLUCOSE-CAPILLARY: 86 mg/dL (ref 70–99)
GLUCOSE-CAPILLARY: 91 mg/dL (ref 70–99)

## 2017-12-02 LAB — PHOSPHORUS: Phosphorus: 4.7 mg/dL — ABNORMAL HIGH (ref 2.5–4.6)

## 2017-12-02 LAB — URINE CULTURE

## 2017-12-02 LAB — MAGNESIUM: Magnesium: 1.7 mg/dL (ref 1.7–2.4)

## 2017-12-02 NOTE — Care Management (Signed)
Referral to Santiago Glad with [facility] for home palliative when discharged per daughter Ileene Rubens request.

## 2017-12-02 NOTE — Progress Notes (Signed)
Physical Therapy Treatment Patient Details Name: Michelle [name] MRN: [id number] DOB: 1960-09-08 Today's Date: 12/02/2017    History of Present Illness Pt is a 57 y/o F who presented with severe SOB with COPD exacerbation.  Chest x-ray was clear, patient was slightly hypotensive after intubation, admitted to the ICU.  Trach tube placed 5/16.  Pt demonstrated seizure like activity on 5/23, MRI negative, EEG abnormal but no epileptiform activity.  Pt's PMH includes cocaine abuse, COPD, CAD, hepatitis C, CHF.      PT Comments    PT attempted treatment this morning. Pt is more alert this session than previous however remains very lethargic at this time. Pt inconsistently follows commands, pt able to initiate gripping motion when asked. Pt unable to perform AROM on command of LE other than initiating motion of toes. Pts PROM of LE assessed and continues to be decreased including DF to less than neutral. PROM of B ankles, knees, and hips performed, pt indicates pain noted through grimacing. Due to pts inability to actively participate in therapy and pt not progressing toward goals PT plans to D/c current order. If pt demonstrates improvement and could benefit from PT at future time please initiate through another order. D/c recommendations continue to be vent SNF.   Follow Up Recommendations  SNF(vent SNF)     Equipment Recommendations       Recommendations for Other Services       Precautions / Restrictions Precautions Precautions: Fall;Other (comment) Precaution Comments: trach, NG tube, PICC line Restrictions Weight Bearing Restrictions: No    Mobility  Bed Mobility               General bed mobility comments: Not formally assessed due to pts inability to participate activly in therapy. Due to pts level of gross deconditioning pt would likely be total assist for bed mobility  Transfers                    Ambulation/Gait                 Stairs              Wheelchair Mobility    Modified Rankin (Stroke Patients Only)       Balance                                            Cognition Arousal/Alertness: Lethargic Behavior During Therapy: Flat affect Overall Cognitive Status: Difficult to assess                                 General Comments: Pt responds little. Very lethargic however more awake during this session than previous requiring less cues to open eyes      Exercises Other Exercises Other Exercises: B LE PROM at ankle, knee and hip x 10. attempted supine ther-ex including ankle pumps and SLR however pt unable to participate in active ther-ex.    General Comments        Pertinent Vitals/Pain Pain Assessment: Faces Faces Pain Scale: Hurts even more Pain Location: pt grimaces with PROM of LEs Pain Descriptors / Indicators: Grimacing Pain Intervention(s): Limited activity within patient's tolerance    Home Living  Prior Function            PT Goals (current goals can now be found in the care plan section) Acute Rehab PT Goals Patient Stated Goal: unable to state PT Goal Formulation: Patient unable to participate in goal setting Time For Goal Achievement: 12/09/17 Potential to Achieve Goals: Poor Progress towards PT goals: Not progressing toward goals - comment(unable to activly participate, no improvement toward goals)    Frequency           PT Plan Current plan remains appropriate    Co-evaluation              AM-PAC PT "6 Clicks" Daily Activity  Outcome Measure  Difficulty turning over in bed (including adjusting bedclothes, sheets and blankets)?: Unable Difficulty moving from lying on back to sitting on the side of the bed? : Unable Difficulty sitting down on and standing up from a chair with arms (e.g., wheelchair, bedside commode, etc,.)?: Unable Help needed moving to and from a bed to chair (including a wheelchair)?:  Total Help needed walking in hospital room?: Total Help needed climbing 3-5 steps with a railing? : Total 6 Click Score: 6    End of Session   Activity Tolerance: Patient limited by lethargy Patient left: in bed;with call bell/phone within reach;with bed alarm set   PT Visit Diagnosis: Muscle weakness (generalized) (M62.81);Difficulty in walking, not elsewhere classified (R26.2)     Time: 0122-2411 PT Time Calculation (min) (ACUTE ONLY): 9 min  Charges:                       G Codes:       Wassim Kirksey, SPT    Jolene Guyett 12/02/2017, 11:27 AM

## 2017-12-02 NOTE — Progress Notes (Signed)
[place] [facility] [place] NAME: Michelle [name]    MR#:  [id number]  DATE OF BIRTH:  Sep 28, 1960  SUBJECTIVE:   No acute events overnight.  Remains on the Vent. S/p Trach, Peg.   REVIEW OF SYSTEMS:    Review of Systems  Unable to perform ROS: Intubated    Nutrition: tube feeds Tolerating Diet: Yes Tolerating PT: Bedbound  DRUG ALLERGIES:   Allergies  Allergen Reactions  . Carvedilol Other (See Comments)    Pt gets "sick" when on medication    VITALS:  Blood pressure (!) 157/71, pulse (!) 118, temperature 99.3 F (37.4 C), temperature source Axillary, resp. rate (!) 29, height 4\' 9"  (1.448 m), weight 58.1 kg (128 lb 1.4 oz), last menstrual period 03/06/2005, SpO2 93 %.  PHYSICAL EXAMINATION:   Physical Exam  GENERAL:  57 y.o.-year-old patient lying in bed s/p Trach and connected to vent.  Follows simple commands.  EYES: Pupils equal, round, reactive to light and accommodation. No scleral icterus. Extraocular muscles intact.  HEENT: Head atraumatic, normocephalic. Oropharynx and nasopharynx clear.  NECK:  Supple, no jugular venous distention. No thyroid enlargement, no tenderness.  LUNGS: Normal breath sounds bilaterally, no wheezing, rales, rhonchi. No use of accessory muscles of respiration.  CARDIOVASCULAR: S1, S2 normal. No murmurs, rubs, or gallops.  ABDOMEN: Soft, nontender, nondistended. Bowel sounds present. No organomegaly or mass.  EXTREMITIES: No cyanosis, clubbing , +1-2 dependent edema on upper & Lower ext. b/l  NEUROLOGIC: Cranial nerves II through XII are intact. No focal Motor or sensory deficits b/l.  Globally weak but follows simple commands PSYCHIATRIC: The patient is alert and oriented x 1.  SKIN: No obvious rash, lesion, or ulcer.    LABORATORY PANEL:   CBC Recent Labs  Lab 11/30/17 0519  WBC 10.3  HGB 8.1*  HCT 24.1*  PLT 409    ------------------------------------------------------------------------------------------------------------------  Chemistries  Recent Labs  Lab 11/30/17 0519  NA 139  K 4.3  CL 100  CO2 33*  GLUCOSE 103*  BUN 15  CREATININE <0.30*  CALCIUM 8.8*  MG 1.7  AST 18  ALT 6  ALKPHOS 47  BILITOT 0.2*   ------------------------------------------------------------------------------------------------------------------  Cardiac Enzymes No results for input(s): TROPONINI in the last 168 hours. ------------------------------------------------------------------------------------------------------------------  RADIOLOGY:  No results found.   ASSESSMENT AND PLAN:   57 year old female patient currently in ICU for respiratory failure, hypotension, pneumonia  ** Acute on chronic hypoxic respiratory failure - Status post tracheostomy on vent support - Remains on 30% FiO2 on the vent.  Was able to tolerated Trach collar for 9 hrs over the weekend. - Continue vent weaning per intensivist  ** Health Care Associated Pneumonia - Tracheal aspirates grew Staph Aureus - Finished Abx course.  ** Hypotension - was on vasopressors but now weaned off it.    Stable now  ** Critical illness polyneuropathy - cont. Supportive care  ** Acute on chronic COPD exacerbation - stable. Cont. scheduled duo nebs, Pulmicort nebs.  **Anxiety - Continue Xanax, Klonopin.  ** Stage II decubitus ulcer - sacral.  - cont. Local wound care as per wound care nurse.    All the records are reviewed and case discussed with Care Management/Social Worker. Management plans discussed with the patient, family and they are in agreement.  CODE STATUS: Partial code  DVT Prophylaxis: Lovenox  TOTAL TIME TAKING CARE OF THIS PATIENT: 25 minutes.   POSSIBLE D/C unclear, DEPENDING ON CLINICAL CONDITION.   Henreitta Leber M.D on  12/02/2017 at 3:03 PM  Between 7am to 6pm - Pager - [phone]  After 6pm  go to [email or web address] - [facility]  Office  [phone]  CC: Primary care physician; Center, [facility]

## 2017-12-02 NOTE — Progress Notes (Signed)
New referral for outpatient PALLIATIVE to follow at home received from [facility]. Patient information faxed to referral. Flo Shanks RN, [facility] [place], hospital Liaison [phone]

## 2017-12-02 NOTE — Clinical Social Work Note (Signed)
CSW spoke with Marzetta Board at [facility] this morning and they have no ventilator beds available. CSW spoke with representative at [facility] and they have no beds available. Both agreed to hold on to patient's referral but stated that the waiting list is quite lengthy. CSW spoke with Sonia Baller at [facility] [place] facilities and patient would have to be reviewed again to determine if they would be able to take patient. CSW will send referral again even though the family has repeatedly expressed they will not send patient out of state. Shela Leff MSW,LCSW [phone]

## 2017-12-02 NOTE — Care Management (Addendum)
Barriers: - Attempting to get [facility] to follow as outpatient. Dr. Patsy Baltimore [phone] (talking with Gloriann Loan). Bell said that she left message for Dr. Patsy Baltimore on Friday however no callback.  I am also checking with [place] Team. - I'm thinking both daughters have provided financial info during SNF search..Checking with CSW and how can Alvis Lemmings get copies for Medicaid  - Speaking with [name] by phone now (10:10A) and has not trained with vent/[facility]- she agrees to [employer] with [facility] "when she and I get off the phone".  - Primary caregiver [place] [phone] during day and Kelsey Durflinger [phone] during night. - Home repairs- still working on porch and handicap ramp.  [name] would like to add Palliative to care team when patient goes home.   This information has been shared with Nichola Sizer [facility]), [place] CSW, Tracie Harrier [facility]) and Dr. Mortimer Fries [facility] [place]).  At 1026AM: Dr. Mortimer Fries has accepted patient for home care need.

## 2017-12-02 NOTE — Progress Notes (Signed)
CC  follow up resp failure Still on vent  HPI On vent No acute events Not weaning from vent Needs vent to survive Tolerating TF's  Vitals:   12/02/17 0804 12/02/17 0812 12/02/17 0825 12/02/17 0900  BP:    113/73  Pulse:   86 94  Resp:   14 16  Temp:   98.7 F (37.1 C)   TempSrc:   Axillary   SpO2: 97% 97% 97% 96%  Weight:      Height:       NAD,  HE ENT: NCAT, sclerae white S/p trach No JVD noted Diminished breath sounds, no wheezes RRR, no M Abdomen soft, NT, NABS S/p PEG tube  BUE, BLE pitting edema- decreased Diffuse profound weakness, no focal deficits  Vanc 06/16 >> 06/18 Cefepime 06/16 >> 06/18 Cefazolin 06/18 >>   CXR: No new film   IMP: Very severe COPD/END STAGE COPD Critical illness myopathy Prolonged mechanical ventilation Failure to wean- vent dependency  Tracheostomy tube  MSSA HCAP/VAP 06/16  Severe protein calorie malnutrition  PLAN wean vent as tolerated Continue nutritional  support DISPOSITION PENDING  Corrin Parker, M.D.  [facility]  Medical Director [place] Director [facility] Cardio-Pulmonary Department

## 2017-12-03 LAB — GLUCOSE, CAPILLARY
GLUCOSE-CAPILLARY: 82 mg/dL (ref 70–99)
Glucose-Capillary: 98 mg/dL (ref 70–99)
Glucose-Capillary: 94 mg/dL (ref 70–99)

## 2017-12-03 LAB — BASIC METABOLIC PANEL
ANION GAP: 7 (ref 5–15)
BUN: 19 mg/dL (ref 6–20)
CALCIUM: 8.5 mg/dL — AB (ref 8.9–10.3)
CO2: 31 mmol/L (ref 22–32)
Chloride: 102 mmol/L (ref 98–111)
Glucose, Bld: 108 mg/dL — ABNORMAL HIGH (ref 70–99)
Potassium: 4.5 mmol/L (ref 3.5–5.1)
Sodium: 140 mmol/L (ref 135–145)

## 2017-12-03 LAB — CBC
HCT: 25.5 % — ABNORMAL LOW (ref 35.0–47.0)
Hemoglobin: 7.9 g/dL — ABNORMAL LOW (ref 12.0–16.0)
MCH: 26.8 pg (ref 26.0–34.0)
MCHC: 30.9 g/dL — ABNORMAL LOW (ref 32.0–36.0)
MCV: 86.8 fL (ref 80.0–100.0)
Platelets: 270 10*3/uL (ref 150–440)
RBC: 2.94 MIL/uL — ABNORMAL LOW (ref 3.80–5.20)
RDW: 18.7 % — AB (ref 11.5–14.5)
WBC: 13 10*3/uL — ABNORMAL HIGH (ref 3.6–11.0)

## 2017-12-03 LAB — AMMONIA: Ammonia: 26 umol/L (ref 9–35)

## 2017-12-03 NOTE — Progress Notes (Signed)
Notified Dr. Jefferson Fuel of elevated temp. 100.9. PRN Tylenol given, see MAR. CBC ordered per Dr. [facility] Fuel.

## 2017-12-03 NOTE — Progress Notes (Signed)
Last 24 Hours:  follow up resp failure Presently on ventilator but has tolerated trach collar trials   Vitals:   12/03/17 0300 12/03/17 0400 12/03/17 0500 12/03/17 0600  BP: 113/67 112/67 95/62 109/60  Pulse: 97 97 94 91  Resp: 18 18 19 17   Temp:  99.6 F (37.6 C)    TempSrc:  Oral    SpO2: 96% 93% 95% 96%  Weight:  125 lb 3.5 oz (56.8 kg)    Height:       NAD,  HE ENT: NCAT, sclerae white S/p trach No JVD noted Diminished breath sounds, no wheezes RRR, no M Abdomen soft, NT, NABS S/p PEG tube  BUE, BLE pitting edema- decreased Diffuse profound weakness, no focal deficits  Vanc 06/16 >> 06/18 Cefepime 06/16 >> 06/18 Cefazolin 06/18 >>   CXR: No new film   IMP: Very severe COPD/END STAGE COPD Critical illness myopathy Prolonged mechanical ventilation Failure to wean- vent dependency  Tracheostomy tube  MSSA HCAP/VAP 06/16  Severe protein calorie malnutrition  PLAN wean vent as tolerated Continue nutritional  support DISPOSITION PENDING   [name], DO     Patient ID: Michelle [name], female   DOB: 1960/10/29, 57 y.o.   MRN: [id number]

## 2017-12-03 NOTE — Clinical Social Work Note (Signed)
Sonia Baller with [facility] has new referral to review. Once I receive an official offer, I will ask again if family wishes to pursue transfer out of state. Shela Leff MSW,LcSW [phone]

## 2017-12-03 NOTE — Progress Notes (Signed)
Nutrition Follow-up  DOCUMENTATION CODES:   Non-severe (moderate) malnutrition in context of chronic illness  INTERVENTION:  Continue Jevity 1.5 Cal at 35 mL/hr (840 mL goal daily volume) + Pro-Stat 30 mL BID per G-tube. Provides 1460 kcal, 84 grams of protein, 18.5 grams of fiber, 638 mL H2O daily.  Continue liquid MVI daily per tube.  With free water flush 50 mL Q8hrs patient is receiving a total of 788 mL H2O including water in tube feeding. She will also receive approximately 180 mL fluid with Pro-Stat provision.  NUTRITION DIAGNOSIS:   Moderate Malnutrition related to chronic illness(COPD, CHF, hepatitis C, cocaine abuse) as evidenced by mild fat depletion, moderate fat depletion, mild muscle depletion, moderate muscle depletion.  Ongoing - addressing with TF regimen.  GOAL:   Provide needs based on ASPEN/SCCM guidelines  Met with TF regimen.  MONITOR:   Vent status, Labs, Weight trends, TF tolerance, I & O's  REASON FOR ASSESSMENT:   Ventilator    ASSESSMENT:   57 year old female with PMHx of emphysema, COPD, HTN, hypercholesterolemia, hx pneumothorax s/p chest tube insertion, CAD s/p cardiac catheterization, cocaine abuse, tobacco abuse, asthma, CHF, anxiety, hepatitis C admitted with acute exacerbation of COPD, acute on chronic respiratory failure requiring intubation 4/29, PNA, cocaine intoxication, AKI due to renal hypoperfusion.   -Patient is s/p tracheostomy tube placement on 5/16. OGT was replaced with NGT. -Patient s/p placement of 20 Fr. Pull-through gastrostomy tube on 5/30 by IR. -Per [place] RN documentationon 6/12the device related pressure injury at trach has now healed as no open wounds were found. -Family still wants to take patient home instead of to a vent/SNF. Per chart they are aware 24/7 care will need to be provided. Pending repairs to home before patient can be taken home. -Per chart referrals have been resent to vent/SNF facilities as well but  no beds are available.  Patient remains ventilated through tracheostomy tube. Currently on PRVC mode with FiO2 35%. Abdomen feels soft. Patient still has mild-moderate pitting edema.  Access: 20 Fr. G-tube placed on 5/30 by IR  TF: pt continues to tolerate Jevity 1.5 Cal at 35 mL/hr + Pro-Stat 30 mL BID; per pump history patient received 815 mL tube feeds in the past 24 hrs (97% goal daily volume)  Patient is currently intubated on ventilator support MV: 11.8 L/min Temp (24hrs), Avg:100.2 F (37.9 C), Min:99.1 F (37.3 C), Max:101.7 F (38.7 C)  Propofol: N/A  Medications reviewed and include: clonazepam, famotidine, fentanyl patch, free water flush 50 mL Q8hrs, liquid MVI daily per tube, potassium chloride 20 mEq BID per tube, senna-docusate, Seroquel QHS, Depakene.  Labs reviewed: CBG 82-100, Creatinine <0.3.  I/O: 1100 mL UOP yesterday (0.8 mL/kg/hr); 1 BM today  Weight trend: 56.8 kg on 7/2; still +11 kg from admission  Discussed with RN and on rounds.  Diet Order:   Diet Order    None      EDUCATION NEEDS:   No education needs have been identified at this time  Skin:  Skin Assessment: Skin Integrity Issues: Skin Integrity Issues:: Stage II, Other (Comment) Stage II: medial sacrum (epithelialized) Incisions: N/A Other: MSAD to abdomen and groin  Last BM:  12/03/2017 - small type 6  Height:   Ht Readings from Last 1 Encounters:  10/01/17 4' 9"  (1.448 m)    Weight:   Wt Readings from Last 1 Encounters:  12/03/17 125 lb 3.5 oz (56.8 kg)    Ideal Body Weight:  43.2 kg  BMI:  Body mass index is 27.1 kg/m.  Estimated Nutritional Needs:   Kcal:  1150-1370 (25-30 kcal/kg)  Protein:  70-93 grams (1.5-2 grams/kg)  Fluid:  1.4-1.6 L/day (30-35 mL/kg)  Willey Blade, MS, RD, LDN Office: [phone] Pager: [phone] After Hours/Weekend Pager: [phone]

## 2017-12-03 NOTE — Progress Notes (Signed)
   12/03/17 1400  Clinical Encounter Type  Visited With Patient  Visit Type Follow-up  Spiritual Encounters  Spiritual Needs Prayer   Patient briefly opened her eyes at chaplain's voice.  Chaplain asked if patient would like to rest, she blinked her eyes then closed them again.  Chaplain offered silent and energetic prayers for patient, family, and care team.

## 2017-12-03 NOTE — Clinical Social Work Note (Signed)
Called by [facility] and they still have a bed that they can offer patient. CSW attempted to call [name], but there was no answer and no way to leave a message. CSW then contacted [name] and asked [name] if there is not a way to get patient home, would they consider this bed offer from [place]. [name] stated that it is a long way away and she would have to have her sister, [name], behind it, but if there are no other options, her response to CSW was "that sounds like the only option if not home." [name] was went back and forth about deciding up the bed at [facility]. Shela Leff MSW,LCSW [phone]

## 2017-12-03 NOTE — Progress Notes (Signed)
[place] [facility] [place] NAME: Michelle [name]    MR#:  [id number]  DATE OF BIRTH:  26-Jan-1961  SUBJECTIVE:   No acute events overnight.  Remains on the Vent. S/p Trach, Peg.   REVIEW OF SYSTEMS:    Review of Systems  Unable to perform ROS: Intubated    Nutrition: tube feeds Tolerating Diet: Yes Tolerating PT: Bedbound  DRUG ALLERGIES:   Allergies  Allergen Reactions  . Carvedilol Other (See Comments)    Pt gets "sick" when on medication    VITALS:  Blood pressure 119/74, pulse 100, temperature 100.2 F (37.9 C), temperature source Oral, resp. rate 20, height 4\' 9"  (1.448 m), weight 56.8 kg (125 lb 3.5 oz), last menstrual period 03/06/2005, SpO2 97 %.  PHYSICAL EXAMINATION:   Physical Exam  GENERAL:  57 y.o.-year-old patient lying in bed s/p Trach and connected to vent.  Follows simple commands.  EYES: Pupils equal, round, reactive to light and accommodation. No scleral icterus. Extraocular muscles intact.  HEENT: Head atraumatic, normocephalic. Oropharynx and nasopharynx clear.  NECK:  Supple, no jugular venous distention. No thyroid enlargement, no tenderness.  LUNGS: Normal breath sounds bilaterally, no wheezing, rales, rhonchi. No use of accessory muscles of respiration.  CARDIOVASCULAR: S1, S2 normal. No murmurs, rubs, or gallops.  ABDOMEN: Soft, nontender, nondistended. Bowel sounds present. No organomegaly or mass.  EXTREMITIES: No cyanosis, clubbing , +1-2 dependent edema on upper & Lower ext. b/l  NEUROLOGIC: Cranial nerves II through XII are intact. No focal Motor or sensory deficits b/l.  Globally weak but follows simple commands PSYCHIATRIC: The patient is alert and oriented x 1.  SKIN: No obvious rash, lesion, or ulcer.    LABORATORY PANEL:   CBC Recent Labs  Lab 12/03/17 1252  WBC 13.0*  HGB 7.9*  HCT 25.5*  PLT 270    ------------------------------------------------------------------------------------------------------------------  Chemistries  Recent Labs  Lab 11/30/17 0519 12/02/17 1420 12/03/17 1103  NA 139 138 140  K 4.3 4.6 4.5  CL 100 99 102  CO2 33* 34* 31  GLUCOSE 103* 109* 108*  BUN 15 14 19   CREATININE <0.30* <0.30* <0.30*  CALCIUM 8.8* 8.8* 8.5*  MG 1.7 1.7  --   AST 18  --   --   ALT 6  --   --   ALKPHOS 47  --   --   BILITOT 0.2*  --   --    ------------------------------------------------------------------------------------------------------------------  Cardiac Enzymes No results for input(s): TROPONINI in the last 168 hours. ------------------------------------------------------------------------------------------------------------------  RADIOLOGY:  No results found.   ASSESSMENT AND PLAN:   57 year old female patient currently in ICU for respiratory failure, hypotension, pneumonia  ** Acute on chronic hypoxic respiratory failure - Status post tracheostomy on vent support - Remains on 30% FiO2 on the vent.  Cont. Trach collar trials as per Pulmonary/Intensivist.  ** Health Care Associated Pneumonia - Tracheal aspirates grew Staph Aureus - Finished Abx course.  ** Hypotension - was on vasopressors but now weaned off it.    Stable now  ** Critical illness polyneuropathy - cont. Supportive care  ** Acute on chronic COPD exacerbation - stable. Cont. scheduled duo nebs, Pulmicort nebs.  **Anxiety - Continue Xanax, Klonopin.  ** Stage II decubitus ulcer - sacral.  - cont. Local wound care as per wound care nurse.   Placement as per social work.    All the records are reviewed and case discussed with Care Management/Social Worker. Management plans discussed with the  patient, family and they are in agreement.  CODE STATUS: Partial code  DVT Prophylaxis: Lovenox  TOTAL TIME TAKING CARE OF THIS PATIENT: 25 minutes.   POSSIBLE D/C unclear,  DEPENDING ON CLINICAL CONDITION.   Henreitta Leber M.D on 12/03/2017 at 4:44 PM  Between 7am to 6pm - Pager - [phone]  After 6pm go to [email or web address] - [facility]  Office  [phone]  CC: Primary care physician; Center, [facility]

## 2017-12-04 ENCOUNTER — Inpatient Hospital Stay: Payer: Medicaid Other

## 2017-12-04 LAB — GLUCOSE, CAPILLARY
GLUCOSE-CAPILLARY: 103 mg/dL — AB (ref 70–99)
GLUCOSE-CAPILLARY: 110 mg/dL — AB (ref 70–99)
Glucose-Capillary: 93 mg/dL (ref 70–99)

## 2017-12-04 LAB — SUSCEPTIBILITY, AER + ANAEROB

## 2017-12-04 LAB — SUSCEPTIBILITY RESULT

## 2017-12-04 LAB — CULTURE, RESPIRATORY W GRAM STAIN: Gram Stain: NONE SEEN

## 2017-12-04 LAB — CULTURE, RESPIRATORY

## 2017-12-04 MED ORDER — ALTEPLASE 2 MG IJ SOLR
2.0000 mg | Freq: Once | INTRAMUSCULAR | Status: AC
  Administered 2017-12-04: 2 mg
  Filled 2017-12-04: qty 2

## 2017-12-04 MED ORDER — VANCOMYCIN HCL IN DEXTROSE 1-5 GM/200ML-% IV SOLN
1000.0000 mg | Freq: Once | INTRAVENOUS | Status: AC
  Administered 2017-12-04: 1000 mg via INTRAVENOUS
  Filled 2017-12-04: qty 200

## 2017-12-04 MED ORDER — ALTEPLASE 2 MG IJ SOLR: 2.0000 mg | Freq: Once | INTRAMUSCULAR | Status: DC

## 2017-12-04 MED ORDER — STERILE WATER FOR INJECTION IJ SOLN
INTRAMUSCULAR | Status: AC
  Administered 2017-12-04: 10 mL
  Filled 2017-12-04: qty 20

## 2017-12-04 MED ORDER — FUROSEMIDE 10 MG/ML IJ SOLN
40.0000 mg | Freq: Once | INTRAMUSCULAR | Status: AC
  Administered 2017-12-04: 40 mg via INTRAVENOUS
  Filled 2017-12-04: qty 4

## 2017-12-04 MED ORDER — VANCOMYCIN HCL 500 MG IV SOLR
500.0000 mg | Freq: Two times a day (BID) | INTRAVENOUS | Status: DC
  Administered 2017-12-05: 500 mg via INTRAVENOUS
  Filled 2017-12-04 (×3): qty 500

## 2017-12-04 MED ORDER — PIPERACILLIN-TAZOBACTAM 3.375 G IVPB
3.3750 g | Freq: Three times a day (TID) | INTRAVENOUS | Status: DC
  Administered 2017-12-04 – 2017-12-06 (×7): 3.375 g via INTRAVENOUS
  Filled 2017-12-04 (×8): qty 50

## 2017-12-04 NOTE — Care Management (Signed)
Current plan is for [facility] to complete home vent training with caregivers on Friday 12/06/17 as I'm told that the home repairs will be complete.  [facility] will provide vent to patient in hospital to see how she tolerates it.  RNCM checking with Clearance Coots with Al[facility] Lemmings to see what status is as far has private duty care.

## 2017-12-04 NOTE — Progress Notes (Addendum)
Last 24 Hours:  follow up resp failure Presently on ventilator did not tolerate trach collar trial this morning. Thick secretions noted by respiratory therapist, low-grade temperature and white count increased at 13   Vitals:   12/04/17 0300 12/04/17 0400 12/04/17 0500 12/04/17 0600  BP: (!) 102/56 (!) 102/57 (!) 96/57 100/60  Pulse: 100 98 92 91  Resp: 16 16 16 17   Temp:  100.1 F (37.8 C)    TempSrc:  Axillary    SpO2: 94% 93% 94% 97%  Weight:      Height:       NAD,  HE ENT: NCAT, sclerae white S/p trach No JVD noted Diminished breath sounds, no wheezes, copious secretions RRR, no M Abdomen soft, NT, NABS S/p PEG tube  BUE, BLE pitting edema- decreased Diffuse profound weakness, no focal deficits  Vanc 06/16 >> 06/18 Cefepime 06/16 >> 06/18 Cefazolin 06/18 >>   CXR: No new film   IMP: Very severe COPD/END STAGE COPD. Patient with thick yellow secretions, chest x-ray performed which revealed new right lower lobe pneumonia, started on vancomycin and Zosyn.  Critical illness myopathy  Prolonged mechanical ventilation. Pending LTAC  Failure to wean- vent dependency   Severe protein calorie malnutrition  Anemia. No evidence of active bleeding  Mild leukocytosis Secondary to pneumonia   [name], DO     Patient ID: Michelle [name], female   DOB: 03/04/1961, 57 y.o.   MRN: [id number] Patient ID: Michelle [name], female   DOB: [birth year], 57 y.o.   MRN: [id number]

## 2017-12-04 NOTE — Progress Notes (Signed)
[facility] [place] NAME: Michelle [name]    MR#:  [id number]  DATE OF BIRTH:  [birth year]  SUBJECTIVE:   Patient developed fever yesterday, increasing tracheal secretions and had chest x-ray findings today suggestive of right-sided pneumonia.  Started on broad-spectrum IV antibiotics again.  REVIEW OF SYSTEMS:    Review of Systems  Unable to perform ROS: Intubated    Nutrition: tube feeds Tolerating Diet: Yes Tolerating PT: Bedbound  DRUG ALLERGIES:   Allergies  Allergen Reactions  . Carvedilol Other (See Comments)    Pt gets "sick" when on medication    VITALS:  Blood pressure 102/65, pulse 94, temperature 100 F (37.8 C), temperature source Oral, resp. rate 16, height 4\' 9"  (1.448 m), weight 56.8 kg (125 lb 3.5 oz), last menstrual period 03/06/2005, SpO2 96 %.  PHYSICAL EXAMINATION:   Physical Exam  GENERAL:  57 y.o.-year-old patient lying in bed s/p Trach and connected to vent.  Follows simple commands.  EYES: Pupils equal, round, reactive to light and accommodation. No scleral icterus. Extraocular muscles intact.  HEENT: Head atraumatic, normocephalic. Oropharynx and nasopharynx clear.  NECK:  Supple, no jugular venous distention. No thyroid enlargement, no tenderness.  LUNGS: Poor Resp. effort, no wheezing, rales, rhonchi. No use of accessory muscles of respiration.  CARDIOVASCULAR: S1, S2 normal. No murmurs, rubs, or gallops.  ABDOMEN: Soft, nontender, nondistended. Bowel sounds present. No organomegaly or mass.  EXTREMITIES: No cyanosis, clubbing , +1-2 dependent edema on upper & Lower ext. b/l  NEUROLOGIC: Cranial nerves II through XII are intact. No focal Motor or sensory deficits b/l.  Globally weak but follows simple commands PSYCHIATRIC: The patient is alert and oriented x 1.  SKIN: No obvious rash, lesion, or ulcer.    LABORATORY PANEL:   CBC Recent Labs  Lab 12/03/17 1252  WBC 13.0*  HGB 7.9*  HCT 25.5*   PLT 270   ------------------------------------------------------------------------------------------------------------------  Chemistries  Recent Labs  Lab 11/30/17 0519 12/02/17 1420 12/03/17 1103  NA 139 138 140  K 4.3 4.6 4.5  CL 100 99 102  CO2 33* 34* 31  GLUCOSE 103* 109* 108*  BUN 15 14 19   CREATININE <0.30* <0.30* <0.30*  CALCIUM 8.8* 8.8* 8.5*  MG 1.7 1.7  --   AST 18  --   --   ALT 6  --   --   ALKPHOS 47  --   --   BILITOT 0.2*  --   --    ------------------------------------------------------------------------------------------------------------------  Cardiac Enzymes No results for input(s): TROPONINI in the last 168 hours. ------------------------------------------------------------------------------------------------------------------  RADIOLOGY:  Dg Chest Port 1 View  Result Date: 12/04/2017 CLINICAL DATA:  Increased tracheal secretions. EXAM: PORTABLE CHEST 1 VIEW COMPARISON:  Chest x-ray dated November 30, 2017. FINDINGS: Unchanged tracheostomy tube and right upper extremity PICC line. The heart size and mediastinal contours are within normal limits. Coarsened interstitial markings are similar to prior study. The lungs remain hyperinflated. New hazy density in the right lower lobe. Unchanged retrocardiac opacity. Unchanged small bilateral pleural effusions. No pneumothorax. No acute osseous abnormality. IMPRESSION: 1. New hazy density at the right lung base could reflect edema or infection. 2. Unchanged retrocardiac airspace disease. 3. Stable small bilateral pleural effusions. 4. COPD. Electronically Signed   By: Titus Dubin M.D.   On: 12/04/2017 09:57     ASSESSMENT AND PLAN:   57 year old female patient currently in ICU for respiratory failure, hypotension, pneumonia  ** Acute on chronic hypoxic respiratory  failure - Status post tracheostomy on vent support -Patient developed worsening tracheal secretions and fever.  Chest x-ray suggestive of  pneumonia. Dad started back on IV vancomycin, Zosyn.  Remains on FiO2 at 35%.  Could not tolerate trach collar today.t.  ** Health Care Associated Pneumonia- patient previously had been treated for pneumonia as her tracheal aspirates grew staph aureus.  Patient overnight developed fever of 101, increasing tracheal secretions and chest x-ray today showing right lung base pneumonia. - Patient has been restarted back on some IV vancomycin and Zosyn.  Follow cultures.  Follow fever curve.  ** Hypotension - was on vasopressors but now weaned off it.    Stable now  ** Critical illness polyneuropathy - cont. Supportive care  ** Acute on chronic COPD exacerbation - stable. Cont. scheduled duo nebs, Pulmicort nebs.  **Anxiety - Continue Xanax, Klonopin. - cont. Depakote.   ** Stage II decubitus ulcer - sacral.  - cont. Local wound care as per wound care nurse.   Placement as per social work.    All the records are reviewed and case discussed with Care Management/Social Worker. Management plans discussed with the patient, family and they are in agreement.  CODE STATUS: Partial code  DVT Prophylaxis: Lovenox  TOTAL TIME TAKING CARE OF THIS PATIENT: 25 minutes.   POSSIBLE D/C unclear, DEPENDING ON CLINICAL CONDITION.   Henreitta Leber M.D on 12/04/2017 at 2:31 PM  Between 7am to 6pm - Pager - [phone]  After 6pm go to [email or web address] - [employer] [facility]  Office  [phone]  CC: Primary care physician; Center, [facility]

## 2017-12-04 NOTE — Progress Notes (Signed)
Vancomycin and Zosyn dose delay due to PICC line lumen occlusion. Cath flo administered by IV team. Will administer following cath flo completion per recommendations of IV team.

## 2017-12-04 NOTE — Progress Notes (Signed)
Cath flo Completed. PICC lumens patent with good blood return. Vancomycin and Zosyn administered. Pharmacy notified.

## 2017-12-04 NOTE — Care Management (Signed)
RNCM received call from [facility] with [facility] Dr. Patsy Baltimore [phone] and Dr. Patsy Baltimore cannot accept patient. Patient has not been seen by Dr. Patsy Baltimore since 2017.

## 2017-12-04 NOTE — Progress Notes (Signed)
Pharmacy Antibiotic Note  Michelle [name] is a 57 y.o. female admitted on 09/30/2017 with end-statge COPD and prolonged mechanical ventilation now s/p tracheotomy.  Pharmacy has been consulted for vancomycin and Zosyn dosing due to concerns for possible developing respiratory infection with T 100.4, yellow secretions, and right lung base "hazy density" on CXR.   Plan: Zosyn 3.375 g EI q 8 hours.   Vancomycin 1000 mg iv once followed by 500 mg iv q 12 hours. Will schedule a trough with the 6th dose as T1/2 greater than 12 hours and 5th dose falling on night shift. Will monitor renal function closely.   Height: 4\' 9"  (144.8 cm) Weight: 125 lb 3.5 oz (56.8 kg) IBW/kg (Calculated) : 38.6  Temp (24hrs), Avg:100.1 F (37.8 C), Min:99.1 F (37.3 C), Max:100.9 F (38.3 C)  Recent Labs  Lab 11/30/17 0519 12/02/17 1420 12/03/17 1103 12/03/17 1252  WBC 10.3  --   --  13.0*  CREATININE <0.30* <0.30* <0.30*  --     CrCl cannot be calculated (This lab value cannot be used to calculate CrCl because it is not a number: <0.30).    Allergies  Allergen Reactions  . Carvedilol Other (See Comments)    Pt gets "sick" when on medication    Antimicrobials this admission: cefepime 6/16 >> 6/18 vancomycin 6/16 >> 6/18, vancomycin 7/3 >> Cefazolin 6/18 >> 6/27  Microbiology results: 7/3 TA: pending   Extensive, see chart Cxs negative except for Candida in TA 5/4 and 5/17, CNS 1/4 BCx 5/17, MSSA in TA 6/16, MSSA and Pseudomonas fluorescens in TA 6/21  Thank you for allowing pharmacy to be a part of this patient's care.  Ulice Dash D 12/04/2017 11:12 AM

## 2017-12-04 NOTE — Progress Notes (Signed)
Pt warm to touch. Temp rechecked, 100.4. PRN Tylenol given. Dr. Barbera Setters Notified.

## 2017-12-05 LAB — CBC WITH DIFFERENTIAL/PLATELET
Basophils Absolute: 0 10*3/uL (ref 0–0.1)
Basophils Relative: 0 %
Eosinophils Absolute: 0.1 10*3/uL (ref 0–0.7)
Eosinophils Relative: 1 %
HCT: 27.2 % — ABNORMAL LOW (ref 35.0–47.0)
HEMOGLOBIN: 8.5 g/dL — AB (ref 12.0–16.0)
LYMPHS PCT: 19 %
Lymphs Abs: 1.4 10*3/uL (ref 1.0–3.6)
MCH: 27.1 pg (ref 26.0–34.0)
MCHC: 31.4 g/dL — AB (ref 32.0–36.0)
MCV: 86.3 fL (ref 80.0–100.0)
MONO ABS: 1.9 10*3/uL — AB (ref 0.2–0.9)
MONOS PCT: 25 %
Neutro Abs: 4.2 10*3/uL (ref 1.4–6.5)
Neutrophils Relative %: 55 %
Platelets: 252 10*3/uL (ref 150–440)
RBC: 3.15 MIL/uL — ABNORMAL LOW (ref 3.80–5.20)
RDW: 18.7 % — AB (ref 11.5–14.5)
WBC: 7.7 10*3/uL (ref 3.6–11.0)

## 2017-12-05 LAB — GLUCOSE, CAPILLARY
GLUCOSE-CAPILLARY: 93 mg/dL (ref 70–99)
Glucose-Capillary: 101 mg/dL — ABNORMAL HIGH (ref 70–99)

## 2017-12-05 MED ORDER — SODIUM CHLORIDE 0.9% FLUSH
10.0000 mL | Freq: Two times a day (BID) | INTRAVENOUS | Status: DC
  Administered 2017-12-05 – 2017-12-08 (×7): 10 mL
  Administered 2017-12-09: 20 mL
  Administered 2017-12-09: 10 mL
  Administered 2017-12-10: 30 mL
  Administered 2017-12-10: 10 mL
  Administered 2017-12-11: 20 mL
  Administered 2017-12-11 – 2017-12-12 (×2): 10 mL
  Administered 2017-12-12: 20 mL
  Administered 2017-12-13 – 2017-12-17 (×9): 10 mL
  Administered 2017-12-17: 20 mL
  Administered 2017-12-18 – 2017-12-21 (×7): 10 mL

## 2017-12-05 MED ORDER — JEVITY 1.5 CAL/FIBER PO LIQD
1000.0000 mL | ORAL | Status: DC
  Administered 2017-12-06 – 2017-12-31 (×21): 1000 mL

## 2017-12-05 NOTE — Progress Notes (Signed)
Follow up - Critical Care Medicine Note  Patient Details:    Michelle [name] is an 57 y.o. female.with severe COPD, prolonged mechanical ventilation, status post tracheostomy, with multiple episodes of pneumonia  Lines, Airways, Drains: PICC Triple Lumen 10/04/17 PICC Right Brachial 36 cm 0 cm (Active)  Indication for Insertion or Continuance of Line Prolonged intravenous therapies 12/05/2017  8:00 AM  Exposed Catheter (cm) 0 cm 10/04/2017  5:00 PM  Site Assessment Clean;Dry;Intact 12/04/2017  8:00 PM  Lumen #1 Status Flushed;Infusing;Blood return noted 12/04/2017  8:00 PM  Lumen #2 Status Flushed;Saline locked;Blood return noted 12/04/2017  8:00 PM  Lumen #3 Status Flushed;Saline locked;Blood return noted 12/04/2017  8:00 PM  Dressing Type Transparent;Gauze 12/04/2017  8:00 PM  Dressing Status Clean;Dry;Intact;Antimicrobial disc in place 12/04/2017  8:00 PM  Line Care Connections checked and tightened 12/04/2017  8:00 PM  Line Adjustment (NICU/IV Team Only) No 10/06/2017  2:15 PM  Dressing Intervention Dressing changed 11/29/2017  4:42 PM  Dressing Change Due 12/06/17 12/04/2017  8:00 PM     Gastrostomy/Enterostomy Gastrostomy 20 Fr. LUQ (Active)  Surrounding Skin Dry;Intact 12/05/2017  4:00 AM  Tube Status Patent 12/05/2017  4:00 AM  Drainage Appearance None 12/05/2017  4:00 AM  Dressing Status Clean;Dry;Intact 12/05/2017  4:00 AM  Dressing Intervention Dressing changed 12/02/2017  8:25 AM  Dressing Type Split gauze 12/05/2017  4:00 AM  Dressing Change Due 12/09/17 12/04/2017  9:00 PM  G Port Intake (mL) 120 ml 11/23/2017  9:13 PM  Output (mL) 150 mL 11/23/2017 11:23 PM     External Urinary Catheter (Active)  Collection Container Dedicated Suction Canister 12/05/2017 12:00 AM  Securement Method Tape;Other (Comment) 12/04/2017  9:00 PM  Intervention Equipment Changed 12/04/2017  9:00 PM  Output (mL) 325 mL 12/05/2017  4:00 AM    Anti-infectives:  Anti-infectives (From admission, onward)   Start     Dose/Rate Route  Frequency Ordered Stop   12/05/17 0500  vancomycin (VANCOCIN) 500 mg in sodium chloride 0.9 % 100 mL IVPB     500 mg 100 mL/hr over 60 Minutes Intravenous Every 12 hours 12/04/17 1111     12/04/17 1230  vancomycin (VANCOCIN) IVPB 1000 mg/200 mL premix     1,000 mg 200 mL/hr over 60 Minutes Intravenous  Once 12/04/17 1111 12/04/17 1700   12/04/17 1230  piperacillin-tazobactam (ZOSYN) IVPB 3.375 g     3.375 g 12.5 mL/hr over 240 Minutes Intravenous Every 8 hours 12/04/17 1111     11/19/17 2000  vancomycin (VANCOCIN) IVPB 1000 mg/200 mL premix  Status:  Discontinued     1,000 mg 200 mL/hr over 60 Minutes Intravenous Every 12 hours 11/19/17 1001 11/19/17 1014   11/19/17 1400  ceFAZolin (ANCEF) IVPB 1 g/50 mL premix  Status:  Discontinued     1 g 100 mL/hr over 30 Minutes Intravenous Every 8 hours 11/19/17 1200 11/28/17 1040   11/17/17 2200  vancomycin (VANCOCIN) IVPB 750 mg/150 ml premix  Status:  Discontinued     750 mg 150 mL/hr over 60 Minutes Intravenous Every 12 hours 11/17/17 1152 11/19/17 1001   11/17/17 1200  ceFEPIme (MAXIPIME) 1 g in sodium chloride 0.9 % 100 mL IVPB  Status:  Discontinued     1 g 200 mL/hr over 30 Minutes Intravenous Every 8 hours 11/17/17 1145 11/19/17 0945   11/17/17 1200  vancomycin (VANCOCIN) IVPB 1000 mg/200 mL premix     1,000 mg 200 mL/hr over 60 Minutes Intravenous  Once 11/17/17 1152 11/17/17 1301  11/05/17 0400  ceFEPIme (MAXIPIME) 1 g in sodium chloride 0.9 % 100 mL IVPB  Status:  Discontinued     1 g 200 mL/hr over 30 Minutes Intravenous Every 12 hours 11/05/17 0358 11/06/17 1047   11/01/17 1100  cefTRIAXone (ROCEPHIN) 1 g in sodium chloride 0.9 % 100 mL IVPB  Status:  Discontinued     1 g 200 mL/hr over 30 Minutes Intravenous Daily 11/01/17 1057 11/04/17 1040   10/31/17 1015  ceFAZolin (ANCEF) IVPB 2g/100 mL premix     2 g 200 mL/hr over 30 Minutes Intravenous  Once 10/31/17 1007 10/31/17 1059   10/19/17 2100  vancomycin (VANCOCIN) 500 mg in  sodium chloride 0.9 % 100 mL IVPB  Status:  Discontinued     500 mg 100 mL/hr over 60 Minutes Intravenous Every 12 hours 10/19/17 0915 10/21/17 1043   10/19/17 0800  vancomycin (VANCOCIN) IVPB 1000 mg/200 mL premix     1,000 mg 200 mL/hr over 60 Minutes Intravenous  Once 10/19/17 0734 10/19/17 1000   10/09/17 1100  erythromycin ethylsuccinate (EES) 200 MG/5ML suspension 400 mg  Status:  Discontinued     400 mg Per Tube Every 8 hours 10/09/17 1028 10/21/17 1046   10/07/17 0900  vancomycin (VANCOCIN) 500 mg in sodium chloride 0.9 % 100 mL IVPB  Status:  Discontinued     500 mg 100 mL/hr over 60 Minutes Intravenous Every 8 hours 10/07/17 0343 10/07/17 1033   10/06/17 0100  vancomycin (VANCOCIN) 500 mg in sodium chloride 0.9 % 100 mL IVPB  Status:  Discontinued     500 mg 100 mL/hr over 60 Minutes Intravenous Every 12 hours 10/05/17 1540 10/07/17 0343   10/05/17 1145  vancomycin (VANCOCIN) IVPB 1000 mg/200 mL premix     1,000 mg 200 mL/hr over 60 Minutes Intravenous  Once 10/05/17 1132 10/05/17 1401   10/01/17 2200  levofloxacin (LEVAQUIN) IVPB 750 mg  Status:  Discontinued     750 mg 100 mL/hr over 90 Minutes Intravenous Every 48 hours 10/01/17 2150 10/02/17 1054   10/01/17 1800  levofloxacin (LEVAQUIN) IVPB 500 mg  Status:  Discontinued     500 mg 100 mL/hr over 60 Minutes Intravenous Daily-1800 09/30/17 1853 10/01/17 0804   10/01/17 0900  piperacillin-tazobactam (ZOSYN) IVPB 3.375 g     3.375 g 12.5 mL/hr over 240 Minutes Intravenous Every 8 hours 10/01/17 0804 10/08/17 0545   09/30/17 1700  levofloxacin (LEVAQUIN) IVPB 750 mg     750 mg 100 mL/hr over 90 Minutes Intravenous  Once 09/30/17 1653 10/01/17 0819      Microbiology: Results for orders placed or performed during the hospital encounter of 09/30/17  CULTURE, BLOOD (ROUTINE X 2) w Reflex to ID Panel     Status: None   Collection Time: 09/30/17  5:43 PM  Result Value Ref Range Status   Specimen Description BLOOD CENTRAL LINE   Final   Special Requests   Final    BOTTLES DRAWN AEROBIC AND ANAEROBIC Blood Culture adequate volume   Culture   Final    NO GROWTH 5 DAYS Performed at [facility], [address] [place], [place] [address]    Report Status 10/05/2017 FINAL  Final  MRSA PCR Screening     Status: None   Collection Time: 09/30/17  8:02 PM  Result Value Ref Range Status   MRSA by PCR NEGATIVE NEGATIVE Final    Comment:        The GeneXpert MRSA Assay (FDA approved  for NASAL specimens only), is one component of a comprehensive MRSA colonization surveillance program. It is not intended to diagnose MRSA infection nor to guide or monitor treatment for MRSA infections. Performed at [facility], [address], [place], [place] [address]   Urine Culture     Status: None   Collection Time: 09/30/17  8:04 PM  Result Value Ref Range Status   Specimen Description   Final    URINE, RANDOM Performed at [facility], [address], [place], [place] [address]    Special Requests   Final    NONE Performed at [facility], [address], [place], [place] [address]    Culture   Final    NO GROWTH Performed at [facility], [place] [address], [place], [place] [address]    Report Status 10/02/2017 FINAL  Final  CULTURE, BLOOD (ROUTINE X 2) w Reflex to ID Panel     Status: None   Collection Time: 09/30/17  8:16 PM  Result Value Ref Range Status   Specimen Description BLOOD L HAND  Final   Special Requests   Final    BOTTLES DRAWN AEROBIC AND ANAEROBIC Blood Culture results may not be optimal due to an inadequate volume of blood received in culture bottles   Culture   Final    NO GROWTH 5 DAYS Performed at [facility], [address] [place], [place] [address]    Report Status 10/05/2017 FINAL  Final  Culture, respiratory (NON-Expectorated)     Status: None   Collection Time: 09/30/17  9:07 PM  Result Value Ref Range Status   Specimen  Description   Final    TRACHEAL ASPIRATE Performed at [facility], [address], [place], [place] [address]    Special Requests   Final    NONE Performed at [facility], [address] [place], [place] [address]    Gram Stain   Final    MODERATE WBC PRESENT, PREDOMINANTLY PMN FEW GRAM POSITIVE COCCI IN PAIRS IN CLUSTERS FEW GRAM NEGATIVE RODS    Culture   Final    FEW Consistent with normal respiratory flora. Performed at [facility], [place] [address], [place], [place] [address]    Report Status 10/03/2017 FINAL  Final  CULTURE, BLOOD (ROUTINE X 2) w Reflex to ID Panel     Status: None   Collection Time: 10/05/17 11:48 AM  Result Value Ref Range Status   Specimen Description BLOOD LAC  Final   Special Requests   Final    BOTTLES DRAWN AEROBIC AND ANAEROBIC Blood Culture adequate volume   Culture   Final    NO GROWTH 5 DAYS Performed at [facility], [address] [place], [place] [address]    Report Status 10/10/2017 FINAL  Final  CULTURE, BLOOD (ROUTINE X 2) w Reflex to ID Panel     Status: None   Collection Time: 10/05/17 11:55 AM  Result Value Ref Range Status   Specimen Description BLOOD LT HAND  Final   Special Requests   Final    BOTTLES DRAWN AEROBIC AND ANAEROBIC Blood Culture adequate volume   Culture   Final    NO GROWTH 5 DAYS Performed at [facility], [address] [place], [place] [address]    Report Status 10/10/2017 FINAL  Final  Culture, expectorated sputum-assessment     Status: None   Collection Time: 10/05/17  2:18 PM  Result Value Ref  Range Status   Specimen Description TRACHEAL ASPIRATE  Final   Special Requests NONE  Final   Sputum evaluation   Final    THIS SPECIMEN IS ACCEPTABLE FOR SPUTUM CULTURE Performed at [facility], [address] [place], [place] [address]    Report Status 10/05/2017 FINAL  Final  Culture, respiratory (NON-Expectorated)     Status: None   Collection  Time: 10/05/17  2:18 PM  Result Value Ref Range Status   Specimen Description   Final    TRACHEAL ASPIRATE Performed at [facility], [address] [place], [place] [address]    Special Requests   Final    NONE Reflexed from [phone] Performed at [place] [place], [place] [address]    Gram Stain   Final    MODERATE WBC PRESENT,BOTH PMN AND MONONUCLEAR NO SQUAMOUS EPITHELIAL CELLS SEEN FEW YEAST WITH PSEUDOHYPHAE RARE GRAM POSITIVE COCCI IN CLUSTERS Performed at [facility], [place] [address], [place], [place] [address]    Culture FEW CANDIDA ALBICANS FEW CANDIDA DUBLINIENSIS   Final   Report Status 10/08/2017 FINAL  Final  CULTURE, BLOOD (ROUTINE X 2) w Reflex to ID Panel     Status: Abnormal   Collection Time: 10/18/17 10:51 AM  Result Value Ref Range Status   Specimen Description   Final    BLOOD LEFT ANTECUBITAL Performed at [facility], [address], [place], [place] [address]    Special Requests   Final    BOTTLES DRAWN AEROBIC AND ANAEROBIC Blood Culture adequate volume Performed at [facility], [address] [place], [place] [address]    Culture  Setup Time   Final    GRAM POSITIVE COCCI ANAEROBIC BOTTLE ONLY CRITICAL RESULT CALLED TO, READ BACK BY AND VERIFIED WITH: MELISSA MACCIA 10/19/17 @ 6269  [place]    Culture (A)  Final    STAPHYLOCOCCUS SPECIES (COAGULASE NEGATIVE) THE SIGNIFICANCE OF ISOLATING THIS ORGANISM FROM A SINGLE SET OF BLOOD CULTURES WHEN MULTIPLE SETS ARE DRAWN IS UNCERTAIN. PLEASE NOTIFY THE MICROBIOLOGY DEPARTMENT WITHIN ONE WEEK IF SPECIATION AND SENSITIVITIES ARE REQUIRED. Performed at [facility], [place] [address], [place], [place] [address]    Report Status 10/21/2017 FINAL  Final  Blood Culture ID Panel (Reflexed)     Status: Abnormal   Collection Time: 10/18/17 10:51 AM  Result Value Ref Range Status   Enterococcus species NOT DETECTED NOT DETECTED Final   Listeria  monocytogenes NOT DETECTED NOT DETECTED Final   Staphylococcus species DETECTED (A) NOT DETECTED Final    Comment: Methicillin (oxacillin) resistant coagulase negative staphylococcus. Possible blood culture contaminant (unless isolated from more than one blood culture draw or clinical case suggests pathogenicity). No antibiotic treatment is indicated for blood  culture contaminants. CRITICAL RESULT CALLED TO, READ BACK BY AND VERIFIED WITH: MELISSA [place] 10/19/17 @ 2703  [place]    Staphylococcus aureus NOT DETECTED NOT DETECTED Final   Methicillin resistance DETECTED (A) NOT DETECTED Final    Comment: CRITICAL RESULT CALLED TO, READ BACK BY AND VERIFIED WITH: MELISSA MACCIA 10/19/17 @ 0714  [facility]    Streptococcus species NOT DETECTED NOT DETECTED Final   Streptococcus agalactiae NOT DETECTED NOT DETECTED Final   Streptococcus pneumoniae NOT DETECTED NOT DETECTED Final   Streptococcus pyogenes NOT DETECTED NOT DETECTED Final   Acinetobacter baumannii NOT DETECTED NOT DETECTED Final   Enterobacteriaceae species NOT DETECTED NOT DETECTED Final   Enterobacter cloacae complex NOT DETECTED  NOT DETECTED Final   Escherichia coli NOT DETECTED NOT DETECTED Final   Klebsiella oxytoca NOT DETECTED NOT DETECTED Final   Klebsiella pneumoniae NOT DETECTED NOT DETECTED Final   Proteus species NOT DETECTED NOT DETECTED Final   Serratia marcescens NOT DETECTED NOT DETECTED Final   Haemophilus influenzae NOT DETECTED NOT DETECTED Final   Neisseria meningitidis NOT DETECTED NOT DETECTED Final   Pseudomonas aeruginosa NOT DETECTED NOT DETECTED Final   Candida albicans NOT DETECTED NOT DETECTED Final   Candida glabrata NOT DETECTED NOT DETECTED Final   Candida krusei NOT DETECTED NOT DETECTED Final   Candida parapsilosis NOT DETECTED NOT DETECTED Final   Candida tropicalis NOT DETECTED NOT DETECTED Final    Comment: Performed at [facility], [address] [place], [place] [address]  Culture,  respiratory (NON-Expectorated)     Status: None   Collection Time: 10/18/17 11:09 AM  Result Value Ref Range Status   Specimen Description   Final    TRACHEAL ASPIRATE Performed at [facility], [address] [place], [place] [address]    Special Requests   Final    NONE Performed at [facility], [place], [place] [address]    Gram Stain   Final    MODERATE WBC PRESENT, PREDOMINANTLY PMN NO ORGANISMS SEEN Performed at [place] [address] [place], [place] [address]    Culture   Final    RARE CANDIDA DUBLINIENSIS RARE FUNGUS (MOLD) ISOLATED, PROBABLE CONTAMINANT/COLONIZER (SAPROPHYTE). CONTACT MICROBIOLOGY IF FURTHER IDENTIFICATION REQUIRED [phone].    Report Status 10/21/2017 FINAL  Final  CULTURE, BLOOD (ROUTINE X 2) w Reflex to ID Panel     Status: None   Collection Time: 10/18/17  4:09 PM  Result Value Ref Range Status   Specimen Description BLOOD L FA  Final   Special Requests   Final    BOTTLES DRAWN AEROBIC AND ANAEROBIC Blood Culture adequate volume   Culture   Final    NO GROWTH 5 DAYS Performed at [facility], [address] [place], [place] [address]    Report Status 10/23/2017 FINAL  Final  CULTURE, BLOOD (ROUTINE X 2) w Reflex to ID Panel     Status: None   Collection Time: 10/19/17  8:36 AM  Result Value Ref Range Status   Specimen Description BLOOD BLOOD LEFT HAND  Final   Special Requests   Final    BOTTLES DRAWN AEROBIC AND ANAEROBIC Blood Culture results may not be optimal due to an inadequate volume of blood received in culture bottles   Culture   Final    NO GROWTH 5 DAYS Performed at [facility], [address] [place], [place] [address]    Report Status 10/24/2017 FINAL  Final  CULTURE, BLOOD (ROUTINE X 2) w Reflex to ID Panel     Status: None   Collection Time: 10/19/17  8:52 AM  Result Value Ref Range Status   Specimen Description BLOOD A-LINE DRAW  Final   Special  Requests   Final    BOTTLES DRAWN AEROBIC AND ANAEROBIC Blood Culture adequate volume   Culture   Final    NO GROWTH 5 DAYS Performed at [facility], [address] [place], [place] [address]    Report Status 10/24/2017 FINAL  Final  Culture, respiratory (NON-Expectorated)     Status: None   Collection Time: 10/23/17 11:36 PM  Result Value Ref Range Status   Specimen Description  Final    TRACHEAL ASPIRATE Performed at [facility], [address], [place], [place] [address]    Special Requests   Final    NONE Performed at [facility], [address] [place], [place] [address]    Gram Stain   Final    RARE WBC PRESENT, PREDOMINANTLY PMN NO ORGANISMS SEEN Performed at [facility], [place] [address] [place], [place] [address]    Culture   Final    Consistent with normal respiratory flora. FUNGUS (MOLD) ISOLATED, PROBABLE CONTAMINANT/COLONIZER (SAPROPHYTE). CONTACT MICROBIOLOGY IF FURTHER IDENTIFICATION REQUIRED [phone].    Report Status 10/26/2017 FINAL  Final  Urine Culture     Status: Abnormal   Collection Time: 11/01/17  4:57 AM  Result Value Ref Range Status   Specimen Description   Final    URINE, CLEAN CATCH Performed at [facility], [address], [place], [place] [address]    Special Requests   Final    NONE Performed at [facility], [address], [place], [place] [address]    Culture (A)  Final    <10,000 COLONIES/mL INSIGNIFICANT GROWTH Performed at [place] [address], [place], [place] [address]    Report Status 11/02/2017 FINAL  Final  CULTURE, BLOOD (ROUTINE X 2) w Reflex to ID Panel     Status: None   Collection Time: 11/01/17  1:28 PM  Result Value Ref Range Status   Specimen Description BLOOD LINE  Final   Special Requests   Final    BOTTLES DRAWN AEROBIC AND ANAEROBIC Blood Culture adequate volume   Culture   Final    NO GROWTH 5 DAYS Performed at [facility], [address] [place], [place] [address]    Report Status 11/06/2017 FINAL  Final  CULTURE, BLOOD (ROUTINE X 2) w Reflex to ID Panel     Status: None   Collection Time: 11/01/17  1:51 PM  Result Value Ref Range Status   Specimen Description BLOOD R HAND  Final   Special Requests   Final    BOTTLES DRAWN AEROBIC AND ANAEROBIC Blood Culture results may not be optimal due to an inadequate volume of blood received in culture bottles   Culture   Final    NO GROWTH 5 DAYS Performed at [facility], [address], [place], [place] [address]    Report Status 11/06/2017 FINAL  Final  Culture, respiratory (NON-Expectorated)     Status: None   Collection Time: 11/04/17 12:20 PM  Result Value Ref Range Status   Specimen Description   Final    SPUTUM Performed at [facility], [address] [place], [place] [address]    Special Requests   Final    NONE Performed at [facility], [address] [place], [place] [address]    Gram Stain   Final    ABUNDANT WBC PRESENT,BOTH PMN AND MONONUCLEAR RARE GRAM POSITIVE COCCI Performed at [facility], [place] [address], [place], [place] [address]    Culture   Final    RARE FUNGUS (MOLD) ISOLATED, PROBABLE CONTAMINANT/COLONIZER (SAPROPHYTE). CONTACT MICROBIOLOGY IF FURTHER IDENTIFICATION REQUIRED [phone].   Report Status 11/06/2017 FINAL  Final  Culture, respiratory (NON-Expectorated)     Status: None   Collection Time: 11/17/17  7:57 AM  Result Value Ref Range Status   Specimen Description   Final    TRACHEAL ASPIRATE Performed at [facility], [place],  [place], [place] [address]    Special Requests   Final    NONE Performed at [facility], [address] [place], [place] [address]    Gram Stain   Final    MODERATE WBC PRESENT,BOTH PMN AND MONONUCLEAR MODERATE GRAM POSITIVE COCCI IN PAIRS RARE GRAM NEGATIVE RODS Performed at [facility], [place] [address] [place], [place] [address]    Culture ABUNDANT STAPHYLOCOCCUS AUREUS  Final   Report Status 11/19/2017 FINAL  Final   Organism ID, Bacteria STAPHYLOCOCCUS AUREUS  Final      Susceptibility   Staphylococcus aureus - MIC*    CIPROFLOXACIN <=0.5 SENSITIVE Sensitive     ERYTHROMYCIN <=0.25 SENSITIVE Sensitive     GENTAMICIN <=0.5 SENSITIVE Sensitive     OXACILLIN <=0.25 SENSITIVE Sensitive     TETRACYCLINE <=1 SENSITIVE Sensitive     VANCOMYCIN 1 SENSITIVE Sensitive     TRIMETH/SULFA <=10 SENSITIVE Sensitive     CLINDAMYCIN <=0.25 SENSITIVE Sensitive     RIFAMPIN <=0.5 SENSITIVE Sensitive     Inducible Clindamycin NEGATIVE Sensitive     * ABUNDANT STAPHYLOCOCCUS AUREUS  CULTURE, BLOOD (ROUTINE X 2) w Reflex to ID Panel     Status: None   Collection Time: 11/17/17  9:09 AM  Result Value Ref Range Status   Specimen Description BLOOD LEFT HAND  Final   Special Requests   Final    BOTTLES DRAWN AEROBIC AND ANAEROBIC Blood Culture adequate volume   Culture   Final    NO GROWTH 5 DAYS Performed at [facility], [address] [place], [place] [address]    Report Status 11/22/2017 FINAL  Final  CULTURE, BLOOD (ROUTINE X 2) w Reflex to ID Panel     Status: Abnormal   Collection Time: 11/17/17 10:05 AM  Result Value Ref Range Status   Specimen Description   Final    BLOOD L HAND Performed at [facility], [address], [place], [place] [address]    Special Requests   Final    BOTTLES DRAWN AEROBIC AND ANAEROBIC Blood Culture adequate volume Performed at [facility], [address] [place], [place] [address]    Culture  Setup Time   Final    GRAM POSITIVE COCCI ANAEROBIC BOTTLE ONLY CRITICAL RESULT CALLED TO, READ BACK BY AND VERIFIED WITH: H. ZOMPA PHARMD, AT 1056 11/20/17 BY D. VANHOOK    Culture (A)  Final    MICROCOCCUS SPECIES Standardized susceptibility testing for this organism is not available. Performed at [facility], [place]  [address], [place], [place] [address]    Report Status 11/21/2017 FINAL  Final  Culture, respiratory (NON-Expectorated)     Status: None   Collection Time: 11/24/17  3:57 PM  Result Value Ref Range Status   Specimen Description   Final    SPUTUM Performed at [facility], [address] [place], [place] [address]    Special Requests   Final    NONE Performed at [facility], [place] [place], [place] [address]    Gram Stain   Final    NO WBC SEEN NO ORGANISMS SEEN MODERATE GRAM NEGATIVE RODS FEW GRAM POSITIVE COCCI IN CLUSTERS    Culture   Final    FEW STAPHYLOCOCCUS AUREUS MODERATE PSEUDOMONAS FLUORESCENS SEE SEPARATE REPORT RESULTS FOR ORGANISM 2 IN CHL/EPIC FOR DOS 11/24/17 Performed at [employer] Performed at [facility], [place] [address], [place], [place] [address]  Report Status 12/04/2017 FINAL  Final   Organism ID, Bacteria STAPHYLOCOCCUS AUREUS  Final      Susceptibility   Staphylococcus aureus - MIC*    CIPROFLOXACIN <=0.5 SENSITIVE Sensitive     ERYTHROMYCIN <=0.25 SENSITIVE Sensitive     GENTAMICIN <=0.5 SENSITIVE Sensitive     OXACILLIN 0.5 SENSITIVE Sensitive     TETRACYCLINE <=1 SENSITIVE Sensitive     VANCOMYCIN <=0.5 SENSITIVE Sensitive     TRIMETH/SULFA <=10 SENSITIVE Sensitive     CLINDAMYCIN <=0.25 SENSITIVE Sensitive     RIFAMPIN <=0.5 SENSITIVE Sensitive     Inducible Clindamycin NEGATIVE Sensitive     * FEW STAPHYLOCOCCUS AUREUS  Susceptibility, Aer + Anaerob     Status: None   Collection Time: 11/24/17  3:57 PM  Result Value Ref Range Status   Suscept, Aer + Anaerob Final report  Corrected    Comment: (NOTE) Performed At: [facility] [address] [place], [place] [id number] Rush Farmer MD FM:[id number] CORRECTED ON 07/03 AT 0537: PREVIOUSLY REPORTED AS Preliminary report    Source of Sample SPUTUM  Final    Comment: Performed at [facility], [place] [address], [place], [place] [address]   Susceptibility Result     Status: Abnormal   Collection Time: 11/24/17  3:57 PM  Result Value Ref Range Status   Suscept Result 1 Comment (A)  Final    Comment: (NOTE) Pseudomonas fluorescens Identification performed by account, not confirmed by this laboratory.    Antimicrobial Suscept Comment  Final    Comment: (NOTE)      ** S = Susceptible; I = Intermediate; R = Resistant **                   P = Positive; N = Negative            MICS are expressed in micrograms per mL   Antibiotic                 RSLT#1    RSLT#2    RSLT#3    RSLT#4 Amikacin                       S Aztreonam                      R Cefepime                       S Cefotaxime                     R Ceftazidime                    S Ceftriaxone                    R Ciprofloxacin                  S Gentamicin                     S Imipenem                       S Levofloxacin                   S Meropenem                      S  Piperacillin/Tazobactam        S Ticarcillin/Clavulanate        R Tobramycin                     S Trimethoprim/Sulfa             R Performed At: [facility] [place], [place] [id number] Rush Farmer MD JJ:[id number] Performed at [facility], [place] [address] [place], [place] [address]   CULTURE, BLOOD (ROUTINE X 2) w Reflex to ID Panel     Status: None   Collection Time: 11/24/17  4:29 PM  Result Value Ref Range Status   Specimen Description BLOOD LEFT ASSIST CONTROL  Final   Special Requests   Final    BOTTLES DRAWN AEROBIC AND ANAEROBIC Blood Culture adequate volume   Culture   Final    NO GROWTH 5 DAYS Performed at [facility], [address] [place], [place] [address]    Report Status 11/29/2017 FINAL  Final  CULTURE, BLOOD (ROUTINE X 2) w Reflex to ID Panel     Status: None   Collection Time: 11/24/17  6:08 PM  Result Value Ref Range Status   Specimen Description BLOOD L HAND  Final   Special Requests   Final    BOTTLES DRAWN  AEROBIC AND ANAEROBIC Blood Culture adequate volume   Culture   Final    NO GROWTH 5 DAYS Performed at [facility], [address] [place], [place] [address]    Report Status 11/29/2017 FINAL  Final  MRSA PCR Screening     Status: None   Collection Time: 11/30/17 10:16 AM  Result Value Ref Range Status   MRSA by PCR NEGATIVE NEGATIVE Final    Comment:        The GeneXpert MRSA Assay (FDA approved for NASAL specimens only), is one component of a comprehensive MRSA colonization surveillance program. It is not intended to diagnose MRSA infection nor to guide or monitor treatment for MRSA infections. Performed at [facility], [address], [place], [place] [address]   Urine Culture     Status: Abnormal   Collection Time: 11/30/17  9:31 PM  Result Value Ref Range Status   Specimen Description   Final    URINE, RANDOM Performed at [facility], [address] [place], [place] [address]    Special Requests   Final    NONE Performed at [facility], [address] [place], [place] [address]    Culture (A)  Final    <10,000 COLONIES/mL INSIGNIFICANT GROWTH Performed at [facility], [place] [address], [place], [place] [address]    Report Status 12/02/2017 FINAL  Final  Culture, respiratory (NON-Expectorated)     Status: None (Preliminary result)   Collection Time: 12/04/17 10:38 AM  Result Value Ref Range Status   Specimen Description   Final    TRACHEAL ASPIRATE Performed at [facility], [address] [place], [place] [address]    Special Requests   Final    NONE Performed at [facility], [address] [place], [place] [address]    Gram Stain   Final    MODERATE WBC PRESENT, PREDOMINANTLY PMN FEW GRAM POSITIVE COCCI RARE GRAM NEGATIVE RODS Performed at [facility], [place] [address], [place], [place] [address]    Culture PENDING  Incomplete   Report Status PENDING  Incomplete    Studies: Dg Chest  Port 1 View  Result Date: 12/04/2017 CLINICAL DATA:  Increased tracheal secretions. EXAM: PORTABLE CHEST 1 VIEW COMPARISON:  Chest x-ray dated November 30, 2017. FINDINGS: Unchanged tracheostomy tube and right upper extremity PICC line. The heart size and mediastinal contours are within normal limits. Coarsened interstitial markings are similar to prior study. The lungs remain hyperinflated. New hazy density in the right lower lobe. Unchanged retrocardiac opacity. Unchanged small bilateral pleural effusions. No pneumothorax. No acute osseous abnormality. IMPRESSION: 1. New hazy density at the right lung base could reflect edema or infection. 2. Unchanged retrocardiac airspace disease. 3. Stable small bilateral pleural effusions. 4. COPD. Electronically Signed   By: Titus Dubin M.D.   On: 12/04/2017 09:57   Dg Chest Port 1 View  Result Date: 11/30/2017 CLINICAL DATA:  Acute respiratory failure EXAM: PORTABLE CHEST 1 VIEW COMPARISON:  Eight days ago FINDINGS: Trace bilateral pleural effusion as seen previously. There is generalized interstitial coarsening also parenchymal opacity behind the heart. The left lower lobe was clear on a chest CT 3 months ago. No pneumothorax. Right upper extremity PICC in good position with tip at the SVC. A tracheostomy tube is well seated. Artifact from EKG leads. IMPRESSION: 1. Stable compared to 11/22/2017. 2. Trace effusions and interstitial coarsening from mild edema or patient's COPD. 3. Asymmetric retrocardiac opacity that could be infection or atelectasis. Electronically Signed   By: Monte Fantasia M.D.   On: 11/30/2017 07:34   Dg Chest Port 1 View  Result Date: 11/22/2017 CLINICAL DATA:  Acute respiratory failure EXAM: PORTABLE CHEST 1 VIEW COMPARISON:  11/19/2017 FINDINGS: Cardiac shadow is stable. Tracheostomy tube and right-sided PICC line are again seen and stable. Small bilateral pleural effusions are noted. Significant improved aeration is  noted in the bases bilaterally. No acute bony abnormality is noted. Gastrostomy tube is noted in place. IMPRESSION: Significant improved aeration in the bases although some persistent effusion is noted. Tubes and lines as described above. Electronically Signed   By: [place] Catalina M.D.   On: 11/22/2017 07:19   Dg Chest Port 1 View  Result Date: 11/19/2017 CLINICAL DATA:  Respiratory failure. EXAM: PORTABLE CHEST 1 VIEW COMPARISON:  11/17/2017. FINDINGS: Tracheostomy tube noted in stable position. Right PICC line noted in stable position. Tiny catheter noted over the left upper chest, this may be outside the patient. Continued evaluation on follow-up exam suggested. Heart size stable. Continued progression of bibasilar atelectasis and infiltrates. Small bilateral pleural effusions. No pneumothorax. IMPRESSION: 1. Tracheostomy tube and right PICC line stable position. Tiny catheter is noted over the left upper chest, this may be outside the patient. Continued evaluation on follow-up exams suggested. 2. Continued progression of bibasilar atelectasis and infiltrates. Small bilateral pleural effusions. Electronically Signed   By: Marcello Moores  Register   On: 11/19/2017 06:04   Dg Chest Port 1 View  Result Date: 11/17/2017 CLINICAL DATA:  Patient poor historian at this time. Encounter for patient on mechanically assisted ventilation. EXAM: PORTABLE CHEST - 1 VIEW COMPARISON:  11/15/2017 FINDINGS: Tracheostomy device and right arm PICC stable in position. Worsening left lower lung airspace consolidation with probable adjacent effusion. Mild diffuse interstitial prominence, with some increase in bibasilar interstitial infiltrates or edema. Heart size normal.  Aortic Atherosclerosis (ICD10-170.0) No pneumothorax. Visualized bones unremarkable. IMPRESSION: 1. Worsening bibasilar infiltrates or edema, left greater than right. 2. Increasing left pleural effusion. 3.  Support hardware stable in position. Electronically Signed    By: Lucrezia Europe M.D.   On: 11/17/2017 10:23  Dg Chest Port 1 View  Result Date: 11/15/2017 CLINICAL DATA:  Respiratory failure EXAM: PORTABLE CHEST 1 VIEW COMPARISON:  11/06/2017 FINDINGS: Cardiac shadow is stable. Tracheostomy tube and right-sided PICC line are again noted and stable. The lungs are well aerated bilaterally. Minimal blunting of the costophrenic angles is again seen. No new focal infiltrate is seen. The previously noted interstitial changes are stable in appearance. IMPRESSION: Stable appearance of the chest when compare with the prior exam. Electronically Signed   By: [place] Catalina M.D.   On: 11/15/2017 06:39   Dg Chest Port 1 View  Result Date: 11/06/2017 CLINICAL DATA:  Respiratory failure. EXAM: PORTABLE CHEST 1 VIEW COMPARISON:  11/02/2017 and prior radiographs FINDINGS: A tracheostomy tube and RIGHT PICC line with tip overlying the mid SVC again noted. UPPER limits normal heart size again identified. Bilateral interstitial opacities appear slightly increased and may represent interstitial edema. Trace bilateral pleural effusions are present. No pneumothorax noted. IMPRESSION: Mild bilateral interstitial opacities which appear slightly increased and may represent mild interstitial edema. Trace bilateral pleural effusions. Electronically Signed   By: Margarette Canada M.D.   On: 11/06/2017 10:29    Consults: Treatment Team:  Pccm, Ander Gaster, MD Saundra Shelling, MD Clapacs, Madie Reno, MD Salary, Avel Peace, MD   Subjective:    Overnight Issues: restarted back on antibiotics, chest x-ray showed pneumonia, patient with copious secretions, febrile episode and leukocytosis  Objective:  Vital signs for last 24 hours: Temp:  [98.3 F (36.8 C)-100.4 F (38 C)] 99.7 F (37.6 C) (07/04 0400) Pulse Rate:  [83-101] 89 (07/04 0600) Resp:  [16-20] 16 (07/04 0600) BP: (92-136)/(55-82) 108/65 (07/04 0600) SpO2:  [92 %-100 %] 98 % (07/04 0801) FiO2 (%):  [35 %] 35 % (07/04  0801)  Hemodynamic parameters for last 24 hours:    Intake/Output from previous day: 07/03 0701 - 07/04 0700 In: 990 [NG/GT:990] Out: 1325 [Urine:1325]  Intake/Output this shift: No intake/output data recorded.  Vent settings for last 24 hours: Vent Mode: PRVC FiO2 (%):  [35 %] 35 % Set Rate:  [16 bmp] 16 bmp Vt Set:  [450 mL] 450 mL PEEP:  [5 cmH20] 5 cmH20 Plateau Pressure:  [15 cmH20-16 cmH20] 15 cmH20  Physical Exam:  Vital signs: Please see the above listed vital signs HEENT: Tracheostomy in place, some decrease in secretions from yesterday Cardiovascular: Regular rate and rhythm Pulmonary: Coarse rhonchi appreciated bilaterally Abdominal: Positive bowel sounds, soft Extruded: No clubbing cyanosis, edema is appreciated Neurologic: Global weakness but able to move all extremities  Assessment/Plan:   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 Patient with thick yellow secretions, chest x-ray performed which revealed new right lower lobe pneumonia, started on vancomycin and Zosyn.as auditory culture consistent with gram-negative rods in gram-positive cocci in clusters pending speciation and sensitivity  Critical illness myopathy  Prolonged mechanical ventilation. Pending LTAC  Severe protein calorie malnutrition. Nutrition following  Anemia. No evidence of active bleeding  Leukocytosis. Resolved after resuming antibiotics  Critical care time 35 minutes  Ariany Kesselman 12/05/2017  *Care during the described time interval was provided by me and/or other providers on the critical care team.  I have reviewed this patient's available data, including medical history, events of note, physical examination and test results as part of my evaluation.

## 2017-12-05 NOTE — Progress Notes (Signed)
Palliative NO CHARGE NOTE  Palliative has been following patient since admission. Reached out to daughters [place] and [name]. At this time they are hopefull patient will be returning home with home care support and services. Santiago Glad, RN (hospice liaison) is aware of Palliative needs at discharge.   Palliative to sign-off on patient. If services are needed please re-consult.   Thank you for allowing [place] to be involved in patient's care.   Alda Lea, NP-BC Palliative Medicine Team  Phone: [phone] Fax: [phone] Pager: [phone] Amion: Delane Ginger. [place]

## 2017-12-05 NOTE — Progress Notes (Signed)
Patient ID: Michelle [name], female   DOB: 09-Jan-1961, 57 y.o.   MRN: [id number]  [facility] PROGRESS NOTE  TRANIYA PRICHETT HCW:[id number] DOB: 01-24-1961 DOA: 09/30/2017 PCP: [facility]  HPI/Subjective: Patient able to open her eyes and try to answer a few questions by mouthing the words.  Unable to move her arms and legs very well.  Objective: Vitals:   12/05/17 1200 12/05/17 1335  BP: 129/79   Pulse: 96   Resp: 16   Temp: 98.6 F (37 C)   SpO2: 97% 97%    Filed Weights   12/01/17 0400 12/02/17 0247 12/03/17 0400  Weight: 58.1 kg (128 lb 1.4 oz) 58.1 kg (128 lb 1.4 oz) 56.8 kg (125 lb 3.5 oz)    ROS: Review of Systems  Unable to perform ROS: Acuity of condition  Respiratory: Negative for shortness of breath.   Cardiovascular: Negative for chest pain.  Gastrointestinal: Negative for abdominal pain.   Exam: Physical Exam  HENT:  Nose: No mucosal edema.  Mouth/Throat: No oropharyngeal exudate or posterior oropharyngeal edema.  Eyes: Pupils are equal, round, and reactive to light. Conjunctivae and lids are normal.  Neck: No JVD present. Carotid bruit is not present. No edema present. No thyroid mass and no thyromegaly present.  Cardiovascular: S1 normal and S2 normal. Exam reveals no gallop.  No murmur heard. Pulses:      Dorsalis pedis pulses are 2+ on the right side, and 2+ on the left side.  Respiratory: No respiratory distress. She has decreased breath sounds in the right lower field and the left lower field. She has no wheezes. She has rhonchi in the right lower field and the left lower field. She has no rales.  GI: Soft. Bowel sounds are normal. There is no tenderness.  Musculoskeletal:       Right ankle: She exhibits no swelling.       Left ankle: She exhibits no swelling.  Lymphadenopathy:    She has no cervical adenopathy.  Neurological: She is alert.  Unable to lift up her arms or legs up off the bed.  Able to squeeze with her hands.  Able  to wiggle toes.  Skin: Skin is warm. No rash noted. Nails show no clubbing.  Psychiatric: She has a normal mood and affect.      Data Reviewed: Basic Metabolic Panel: Recent Labs  Lab 11/30/17 0519 12/02/17 1420 12/03/17 1103  NA 139 138 140  K 4.3 4.6 4.5  CL 100 99 102  CO2 33* 34* 31  GLUCOSE 103* 109* 108*  BUN _0 CREATININE <0.30* <0.30* <0.30*  CALCIUM 8.8* 8.8* 8.5*  MG 1.7 1.7  --   PHOS 5.0* 4.7*  --    Liver Function Tests: Recent Labs  Lab 11/30/17 0519  AST 18  ALT 6  ALKPHOS 47  BILITOT 0.2*  PROT 5.9*  ALBUMIN 1.8*    Recent Labs  Lab 12/03/17 1103  AMMONIA 26   CBC: Recent Labs  Lab 11/30/17 0519 12/03/17 1252 12/05/17 0416  WBC 10.3 13.0* 7.7  NEUTROABS  --   --  4.2  HGB 8.1* 7.9* 8.5*  HCT 24.1* 25.5* 27.2*  MCV 86.2 86.8 86.3  PLT 409 270 252   BNP (last 3 results) Recent Labs    06/29/17 0412 08/20/17 1337 09/30/17 1705  BNP 38.0 33.0 66.0    CBG: Recent Labs  Lab 12/03/17 2204 12/04/17 0732 12/04/17 1542 12/04/17 2353 12/05/17 0713  GLUCAP  94 110* 93 103* 101*    Recent Results (from the past 240 hour(s))  MRSA PCR Screening     Status: None   Collection Time: 11/30/17 10:16 AM  Result Value Ref Range Status   MRSA by PCR NEGATIVE NEGATIVE Final    Comment:        The GeneXpert MRSA Assay (FDA approved for NASAL specimens only), is one component of a comprehensive MRSA colonization surveillance program. It is not intended to diagnose MRSA infection nor to guide or monitor treatment for MRSA infections. Performed at [facility], [address] [place], [place] [address]   Urine Culture     Status: Abnormal   Collection Time: 11/30/17  9:31 PM  Result Value Ref Range Status   Specimen Description   Final    URINE, RANDOM Performed at [facility], [address] [place], [place] [address]    Special Requests   Final    NONE Performed at [facility], [address] [place], [place] [address]    Culture (A)  Final    <10,000 COLONIES/mL INSIGNIFICANT GROWTH Performed at [facility], [place] [address] [place], [place] [address]    Report Status 12/02/2017 FINAL  Final  Culture, respiratory (NON-Expectorated)     Status: None (Preliminary result)   Collection Time: 12/04/17 10:38 AM  Result Value Ref Range Status   Specimen Description   Final    TRACHEAL ASPIRATE Performed at [facility], [address] [place], [place] [address]    Special Requests   Final    NONE Performed at [facility], [address] [place], [place] [address]    Gram Stain   Final    MODERATE WBC PRESENT, PREDOMINANTLY PMN FEW GRAM POSITIVE COCCI RARE GRAM NEGATIVE RODS    Culture   Final    ABUNDANT STAPHYLOCOCCUS AUREUS SUSCEPTIBILITIES TO FOLLOW Performed at [facility], [place] [address] [place],  [address]    Report Status PENDING  Incomplete     Studies: Dg Chest Port 1 View  Result Date: 12/04/2017 CLINICAL DATA:  Increased tracheal secretions. EXAM: PORTABLE CHEST 1 VIEW COMPARISON:  Chest x-ray dated November 30, 2017. FINDINGS: Unchanged tracheostomy tube and right upper extremity PICC line. The heart size and mediastinal contours are within normal limits. Coarsened interstitial markings are similar to prior study. The lungs remain hyperinflated. New hazy density in the right lower lobe. Unchanged retrocardiac opacity. Unchanged small bilateral pleural effusions. No pneumothorax. No acute osseous abnormality. IMPRESSION: 1. New hazy density at the right lung base could reflect edema or infection. 2. Unchanged retrocardiac airspace disease. 3. Stable small bilateral pleural effusions. 4. COPD. Electronically Signed   By: Titus Dubin M.D.   On: 12/04/2017 09:57    Scheduled Meds: . alteplase  2 mg Intracatheter Once  . alteplase  2 mg Intracatheter Once  . budesonide (PULMICORT) nebulizer solution  0.5 mg  Nebulization BID  . chlorhexidine gluconate (MEDLINE KIT)  15 mL Mouth Rinse BID  . clonazePAM  0.5 mg Oral Daily  . clonazePAM  1 mg Per Tube QHS  . enoxaparin (LOVENOX) injection  40 mg Subcutaneous Q24H  . famotidine  20 mg Per Tube Daily  . feeding supplement (PRO-STAT SUGAR FREE 64)  30 mL Per Tube BID  . fentaNYL  50 mcg Transdermal Q72H  . free water  50 mL Per Tube Q8H  . ipratropium-albuterol  3 mL Nebulization Q6H  .  mouth rinse  15 mL Mouth Rinse 10 times per day  . multivitamin  15 mL Per Tube Daily  . nystatin cream   Topical BID  . PARoxetine  20 mg Per Tube BID  . potassium chloride  20 mEq Per Tube BID  . QUEtiapine  25 mg Per Tube QHS  . senna-docusate  1 tablet Per Tube BID  . valproic acid  500 mg Per Tube BID   Continuous Infusions: . [START ON 12/06/2017] feeding supplement (JEVITY 1.5 CAL/FIBER)    . piperacillin-tazobactam (ZOSYN)  IV Stopped (12/05/17 1215)    Assessment/Plan:  1. Ventilator associated pneumonia.  Staph aureus and a gram-negative rod growing out of cultures.  Continue vancomycin and Zosyn until cultures back. 2. Acute on chronic hypoxic respiratory failure status post tracheostomy on ventilator support.  Unable to wean from ventilator.  FiO2 at 35%. 3. Critical illness polyneuropathy.  Patient unable to move her arms and legs very well at all. 4. Severe anxiety on Xanax Klonopin and Depakote 5. COPD on nebulizer treatments 6. Stage II decubitus ulcer on sacrum.  Local wound care   Code Status:     Code Status Orders  (From admission, onward)        Start     Ordered   10/28/17 1707  Limited resuscitation (code)  Continuous    Question Answer Comment  In the event of cardiac or respiratory ARREST: Initiate Code Blue, Call Rapid Response No   In the event of cardiac or respiratory ARREST: Perform CPR No   In the event of cardiac or respiratory ARREST: Perform Intubation/Mechanical Ventilation Yes   In the event of cardiac or  respiratory ARREST: Use NIPPV/BiPAp only if indicated No   In the event of cardiac or respiratory ARREST: Administer ACLS medications if indicated No   In the event of cardiac or respiratory ARREST: Perform Defibrillation or Cardioversion if indicated No   Comments Continue trach and ventilator. No resusitation if heart stops.      10/28/17 1711    Code Status History    Date Active Date Inactive Code Status Order ID Comments User Context   09/30/2017 1945 10/28/2017 1711 Full Code [id number]  [name], MD Inpatient   08/20/2017 1713 08/24/2017 1743 Full Code [id number]  Dustin Flock, MD Inpatient   06/29/2017 0625 07/02/2017 2008 Full Code [id number]  Harrie Foreman, MD Inpatient   02/23/2017 0241 02/26/2017 1634 Full Code [id number]  Harrie Foreman, MD Inpatient   01/01/2017 1144 01/04/2017 1354 Full Code [id number]  Bettey Costa, MD Inpatient   05/22/2016 2155 05/27/2016 1645 Full Code [id number]  Fritzi Mandes, MD Inpatient   04/23/2016 2020 04/27/2016 1848 Full Code [id number]  Hower, Aaron Mose, MD ED   02/07/2016 1922 02/10/2016 1835 Full Code [id number]  Gladstone Lighter, MD Inpatient   11/29/2015 0519 12/01/2015 1609 Full Code [id number]  Harrie Foreman, MD Inpatient   11/05/2015 0911 11/07/2015 1606 Full Code [id number]  Hillary Bow, MD ED   10/24/2014 1522 10/27/2014 1630 Full Code [id number]  Hillary Bow, MD Inpatient     Family Communication: As per critical care specialist Disposition Plan: No plan at this time  Antibiotics:  Vancomycin  Zosyn  Time spent: 24 minutes, case discussed with nursing staff  [facility]

## 2017-12-05 NOTE — Progress Notes (Signed)
Patient was receiving care and opened her eyes. Chaplain prayed a silent and energetic prayer outside of the room.

## 2017-12-05 NOTE — Progress Notes (Signed)
Pharmacy Antibiotic Note  Michelle [name] is a 57 y.o. female admitted on 09/30/2017 with end-statge COPD and prolonged mechanical ventilation now s/p tracheotomy.  Pharmacy has been consulted for vancomycin and Zosyn dosing due to concerns for possible developing respiratory infection with T 100.4, yellow secretions, and right lung base "hazy density" on CXR.   Plan: Zosyn 3.375 g EI q 8 hours.   Vancomycin discontinued - MRSA PCR is negative   Height: 4\' 9"  (144.8 cm) Weight: 125 lb 3.5 oz (56.8 kg) IBW/kg (Calculated) : 38.6  Temp (24hrs), Avg:99 F (37.2 C), Min:98.3 F (36.8 C), Max:100.1 F (37.8 C)  Recent Labs  Lab 11/30/17 0519 12/02/17 1420 12/03/17 1103 12/03/17 1252 12/05/17 0416  WBC 10.3  --   --  13.0* 7.7  CREATININE <0.30* <0.30* <0.30*  --   --     CrCl cannot be calculated (This lab value cannot be used to calculate CrCl because it is not a number: <0.30).    Allergies  Allergen Reactions  . Carvedilol Other (See Comments)    Pt gets "sick" when on medication    Antimicrobials this admission: cefepime 6/16 >> 6/18 vancomycin 6/16 >> 6/18, vancomycin 7/3 >> Cefazolin 6/18 >> 6/27  Microbiology results: 7/3 TA: pending   Extensive, see chart Cxs negative except for Candida in TA 5/4 and 5/17, CNS 1/4 BCx 5/17, MSSA in TA 6/16, MSSA and Pseudomonas fluorescens in TA 6/21  Thank you for allowing pharmacy to be a part of this patient's care.  Laural Benes 12/05/2017 2:14 PM

## 2017-12-06 ENCOUNTER — Inpatient Hospital Stay: Payer: Medicaid Other

## 2017-12-06 LAB — CULTURE, RESPIRATORY W GRAM STAIN

## 2017-12-06 LAB — GLUCOSE, CAPILLARY
GLUCOSE-CAPILLARY: 104 mg/dL — AB (ref 70–99)
GLUCOSE-CAPILLARY: 106 mg/dL — AB (ref 70–99)
GLUCOSE-CAPILLARY: 96 mg/dL (ref 70–99)

## 2017-12-06 LAB — CBC
HCT: 24.3 % — ABNORMAL LOW (ref 35.0–47.0)
HEMOGLOBIN: 7.5 g/dL — AB (ref 12.0–16.0)
MCH: 27 pg (ref 26.0–34.0)
MCHC: 31 g/dL — AB (ref 32.0–36.0)
MCV: 87.2 fL (ref 80.0–100.0)
PLATELETS: 263 10*3/uL (ref 150–440)
RBC: 2.79 MIL/uL — ABNORMAL LOW (ref 3.80–5.20)
RDW: 18.7 % — AB (ref 11.5–14.5)
WBC: 6.3 10*3/uL (ref 3.6–11.0)

## 2017-12-06 LAB — CULTURE, RESPIRATORY

## 2017-12-06 LAB — URIC ACID: URIC ACID, SERUM: 3.2 mg/dL (ref 2.5–7.1)

## 2017-12-06 MED ORDER — FLORANEX PO PACK
1.0000 g | PACK | Freq: Three times a day (TID) | ORAL | Status: DC
  Administered 2017-12-07 (×3): 1 g
  Filled 2017-12-06 (×6): qty 1

## 2017-12-06 MED ORDER — NYSTATIN 100000 UNIT/ML MT SUSP
5.0000 mL | Freq: Four times a day (QID) | OROMUCOSAL | Status: DC
  Administered 2017-12-06 – 2017-12-24 (×62): 500000 [IU] via ORAL
  Filled 2017-12-06 (×79): qty 5

## 2017-12-06 MED ORDER — CIPROFLOXACIN IN D5W 400 MG/200ML IV SOLN
400.0000 mg | Freq: Two times a day (BID) | INTRAVENOUS | Status: DC
  Administered 2017-12-06 – 2017-12-09 (×6): 400 mg via INTRAVENOUS
  Filled 2017-12-06 (×8): qty 200

## 2017-12-06 NOTE — Progress Notes (Signed)
Patient ID: Michelle [name], female   DOB: [birth year], 57 y.o.   MRN: [id number]   [facility] PROGRESS NOTE  SEVEN MARENGO XIH:[id number] DOB: [birth year] DOA: 09/30/2017 PCP: [facility] [place]  HPI/Subjective: Patient more alert today and actually able to do a little bit better with squeezing hands and holding up her forearms once I lifted them up off the bed for her.  Objective: Vitals:   12/06/17 0900 12/06/17 1000  BP: 102/90 104/65  Pulse: 89 82  Resp: 19 18  Temp:    SpO2: 98% 97%    Filed Weights   12/01/17 0400 12/02/17 0247 12/03/17 0400  Weight: 58.1 kg (128 lb 1.4 oz) 58.1 kg (128 lb 1.4 oz) 56.8 kg (125 lb 3.5 oz)    ROS: Review of Systems  Unable to perform ROS: Acuity of condition  Respiratory: Negative for shortness of breath.   Cardiovascular: Negative for chest pain.  Gastrointestinal: Negative for abdominal pain.   Exam: Physical Exam  HENT:  Nose: No mucosal edema.  Thrush on the tongue.  Eyes: Pupils are equal, round, and reactive to light. Conjunctivae and lids are normal.  Neck: No JVD present. Carotid bruit is not present. No edema present. No thyroid mass and no thyromegaly present.  Cardiovascular: S1 normal and S2 normal. Exam reveals no gallop.  No murmur heard. Pulses:      Dorsalis pedis pulses are 2+ on the right side, and 2+ on the left side.  Respiratory: No respiratory distress. She has decreased breath sounds in the right lower field and the left lower field. She has no wheezes. She has rhonchi in the right lower field and the left lower field. She has no rales.  GI: Soft. Bowel sounds are normal. There is no tenderness.  Musculoskeletal:       Right ankle: She exhibits no swelling.       Left ankle: She exhibits no swelling.  Lymphadenopathy:    She has no cervical adenopathy.  Neurological: She is alert.  Unable to lift up her arms or legs up off the bed.  Able to squeeze with her hands.  Able to wiggle toes.  Skin:  Skin is warm. No rash noted. Nails show no clubbing.  Psychiatric: She has a normal mood and affect.      Data Reviewed: Basic Metabolic Panel: Recent Labs  Lab 11/30/17 0519 12/02/17 1420 12/03/17 1103  NA 139 138 140  K 4.3 4.6 4.5  CL 100 99 102  CO2 33* 34* 31  GLUCOSE 103* 109* 108*  BUN _0 CREATININE <0.30* <0.30* <0.30*  CALCIUM 8.8* 8.8* 8.5*  MG 1.7 1.7  --   PHOS 5.0* 4.7*  --    Liver Function Tests: Recent Labs  Lab 11/30/17 0519  AST 18  ALT 6  ALKPHOS 47  BILITOT 0.2*  PROT 5.9*  ALBUMIN 1.8*    Recent Labs  Lab 12/03/17 1103  AMMONIA 26   CBC: Recent Labs  Lab 11/30/17 0519 12/03/17 1252 12/05/17 0416 12/06/17 0537  WBC 10.3 13.0* 7.7 6.3  NEUTROABS  --   --  4.2  --   HGB 8.1* 7.9* 8.5* 7.5*  HCT 24.1* 25.5* 27.2* 24.3*  MCV 86.2 86.8 86.3 87.2  PLT 409 270 252 263   BNP (last 3 results) Recent Labs    06/29/17 0412 08/20/17 1337 09/30/17 1705  BNP 38.0 33.0 66.0    CBG: Recent Labs  Lab 12/04/17 2353 12/05/17 3491  12/05/17 1519 12/05/17 2359 12/06/17 0759  GLUCAP 103* 101* 93 106* 104*    Recent Results (from the past 240 hour(s))  MRSA PCR Screening     Status: None   Collection Time: 11/30/17 10:16 AM  Result Value Ref Range Status   MRSA by PCR NEGATIVE NEGATIVE Final    Comment:        The GeneXpert MRSA Assay (FDA approved for NASAL specimens only), is one component of a comprehensive MRSA colonization surveillance program. It is not intended to diagnose MRSA infection nor to guide or monitor treatment for MRSA infections. Performed at [facility], [address] [place], [place] [address]   Urine Culture     Status: Abnormal   Collection Time: 11/30/17  9:31 PM  Result Value Ref Range Status   Specimen Description   Final    URINE, RANDOM Performed at [facility], [address] [place], [place] [address]    Special Requests   Final    NONE Performed at [facility], [address] [place], [place] [address]    Culture (A)  Final    <10,000 COLONIES/mL INSIGNIFICANT GROWTH Performed at [facility], [place] [address], [place], [place] [address]    Report Status 12/02/2017 FINAL  Final  Culture, respiratory (NON-Expectorated)     Status: None (Preliminary result)   Collection Time: 12/04/17 10:38 AM  Result Value Ref Range Status   Specimen Description   Final    TRACHEAL ASPIRATE Performed at [facility], [address], [place], [place] [address]    Special Requests   Final    NONE Performed at [facility], [address] [place], [place] [address]    Gram Stain   Final    MODERATE WBC PRESENT, PREDOMINANTLY PMN FEW GRAM POSITIVE COCCI RARE GRAM NEGATIVE RODS Performed at [facility], [place] [address] [place],  [address]    Culture ABUNDANT STAPHYLOCOCCUS AUREUS  Final   Report Status PENDING  Incomplete   Organism ID, Bacteria STAPHYLOCOCCUS AUREUS  Final      Susceptibility   Staphylococcus aureus - MIC*    CIPROFLOXACIN <=0.5 SENSITIVE Sensitive     ERYTHROMYCIN <=0.25 SENSITIVE Sensitive     GENTAMICIN <=0.5 SENSITIVE Sensitive     OXACILLIN <=0.25 SENSITIVE Sensitive     TETRACYCLINE <=1 SENSITIVE Sensitive     VANCOMYCIN <=0.5 SENSITIVE Sensitive     TRIMETH/SULFA <=10 SENSITIVE Sensitive     CLINDAMYCIN <=0.25 SENSITIVE Sensitive     RIFAMPIN <=0.5 SENSITIVE Sensitive     Inducible Clindamycin NEGATIVE Sensitive     * ABUNDANT STAPHYLOCOCCUS AUREUS     Studies: Dg Ankle Right Port  Result Date: 12/06/2017 CLINICAL DATA:  Right ankle pain. EXAM: PORTABLE RIGHT ANKLE - 2 VIEW COMPARISON:  No recent. FINDINGS: Soft tissue swelling noted about the right ankle. No evidence of fracture or dislocation. No acute bony abnormality identified. IMPRESSION: Diffuse soft tissue swelling.  No acute bony abnormality. Electronically Signed   By: Webster   On: 12/06/2017 06:52     Scheduled Meds: . alteplase  2 mg Intracatheter Once  . alteplase  2 mg Intracatheter Once  . budesonide (PULMICORT) nebulizer solution  0.5 mg Nebulization BID  . chlorhexidine gluconate (MEDLINE KIT)  15 mL Mouth Rinse BID  . clonazePAM  0.5 mg Oral Daily  . clonazePAM  1 mg Per Tube QHS  . enoxaparin (LOVENOX) injection  40 mg Subcutaneous  Q24H  . famotidine  20 mg Per Tube Daily  . feeding supplement (PRO-STAT SUGAR FREE 64)  30 mL Per Tube BID  . fentaNYL  50 mcg Transdermal Q72H  . free water  50 mL Per Tube Q8H  . ipratropium-albuterol  3 mL Nebulization Q6H  . mouth rinse  15 mL Mouth Rinse 10 times per day  . multivitamin  15 mL Per Tube Daily  . nystatin  5 mL Oral QID  . nystatin cream   Topical BID  . PARoxetine  20 mg Per Tube BID  . potassium chloride  20 mEq Per Tube BID  . QUEtiapine  25 mg Per Tube QHS  . senna-docusate  1 tablet Per Tube BID  . sodium chloride flush  10-40 mL Intracatheter Q12H  . valproic acid  500 mg Per Tube BID   Continuous Infusions: . feeding supplement (JEVITY 1.5 CAL/FIBER) 1,000 mL (12/06/17 0500)  . piperacillin-tazobactam (ZOSYN)  IV 3.375 g (12/06/17 0944)    Assessment/Plan:  1. Ventilator associated pneumonia.  Staph aureus and a gram-negative rod growing out of cultures.  Patient received vancomycin and Zosyn.  Can consider downgrading antibiotics since the staph aureus is sensitive. 2. Acute on chronic hypoxic respiratory failure status post tracheostomy on ventilator support.  Unable to wean from ventilator.  FiO2 at 30%. 3. Critical illness polyneuropathy.  Patient unable to move her arms and legs very well at all. 4. Severe anxiety on Xanax Klonopin and Depakote 5. COPD on nebulizer treatments 6. Stage II decubitus ulcer on sacrum.  Local wound care 7. Anemia of chronic disease.  Hemoglobin 7.5 today. 8. Nutrition.  Continue tube feeding as per dietary.   Code Status:     Code Status Orders  (From admission,  onward)        Start     Ordered   10/28/17 1707  Limited resuscitation (code)  Continuous    Question Answer Comment  In the event of cardiac or respiratory ARREST: Initiate Code Blue, Call Rapid Response No   In the event of cardiac or respiratory ARREST: Perform CPR No   In the event of cardiac or respiratory ARREST: Perform Intubation/Mechanical Ventilation Yes   In the event of cardiac or respiratory ARREST: Use NIPPV/BiPAp only if indicated No   In the event of cardiac or respiratory ARREST: Administer ACLS medications if indicated No   In the event of cardiac or respiratory ARREST: Perform Defibrillation or Cardioversion if indicated No   Comments Continue trach and ventilator. No resusitation if heart stops.      10/28/17 1711    Code Status History    Date Active Date Inactive Code Status Order ID Comments User Context   09/30/2017 1945 10/28/2017 1711 Full Code [id number]  [name], MD Inpatient   08/20/2017 1713 08/24/2017 1743 Full Code [id number]  Dustin Flock, MD Inpatient   06/29/2017 0625 07/02/2017 2008 Full Code [id number]  Harrie Foreman, MD Inpatient   02/23/2017 0241 02/26/2017 1634 Full Code [id number]  Harrie Foreman, MD Inpatient   01/01/2017 1144 01/04/2017 1354 Full Code [id number]  Bettey Costa, MD Inpatient   05/22/2016 2155 05/27/2016 1645 Full Code [id number]  Fritzi Mandes, MD Inpatient   04/23/2016 2020 04/27/2016 1848 Full Code [id number]  Hower, Aaron Mose, MD ED   02/07/2016 1922 02/10/2016 1835 Full Code [id number]  Gladstone Lighter, MD Inpatient   11/29/2015 0519 12/01/2015 1609 Full Code [id number]  Harrie Foreman, MD Inpatient   11/05/2015 [phone] 11/07/2015 1606  Full Code [id number]  Hillary Bow, MD ED   10/24/2014 1522 10/27/2014 1630 Full Code [id number]  Hillary Bow, MD Inpatient     Family Communication: As per critical care specialist Disposition Plan: No plan at this time  Antibiotics:  Vancomycin  Zosyn  Time spent: 24 minutes, case  discussed with  critical care specialist  [facility]

## 2017-12-06 NOTE — Progress Notes (Signed)
Pt. R ankle slightly reddened, +1 edema, warm/dry vs. L ankle non-pitting, cool/dry. Pt. Also winced in pain with plantar flexion stretch. Ms. Patria Mane, NP alerted- uric acid lab ordered & xray Will continue to monitor pt. Closely.

## 2017-12-06 NOTE — Progress Notes (Signed)
CRITICAL CARE NOTE  CC  follow up respiratory failure  SUBJECTIVE Patient remains critically ill Prognosis is guarded New RLL pneumonia on CXR Restarted IV abx   S/p Trach and s/p PEG tube    BP (!) 100/57   Pulse 84   Temp 97.7 F (36.5 C) (Axillary)   Resp 18   Ht 4\' 9"  (1.448 m)   Wt 125 lb 3.5 oz (56.8 kg)   LMP 03/06/2005 (Approximate)   SpO2 98%   BMI 27.10 kg/m    REVIEW OF SYSTEMS  PATIENT IS UNABLE TO PROVIDE COMPLETE REVIEW OF SYSTEM S DUE TO SEVERE CRITICAL ILLNESS AND ENCEPHALOPATHY   PHYSICAL EXAMINATION:  GENERAL:critically ill appearing, +resp distress HEAD: Normocephalic, atraumatic.  EYES: Pupils equal, round, reactive to light.  No scleral icterus.  MOUTH: Moist mucosal membrane. NECK: Supple. No thyromegaly. No nodules. No JVD.  PULMONARY: +rhonchi, +wheezing CARDIOVASCULAR: S1 and S2. Regular rate and rhythm. No murmurs, rubs, or gallops.  GASTROINTESTINAL: Soft, nontender, -distended. No masses. Positive bowel sounds. No hepatosplenomegaly.  MUSCULOSKELETAL: No swelling, clubbing, or edema.  NEUROLOGIC: obtunded, GCS<8 SKIN:intact,warm,dry  ASSESSMENT AND PLAN 57 y.o. female.with severe COPD, prolonged mechanical ventilation, status post tracheostomy, with multiple episodes of pneumonia Ventilator dependent respiratory failure secondary to end-stage COPD with multiple episodes of pneumonia.  Patient with thick yellow secretions, chest x-ray performed which revealed new right lower lobe pneumonia, started on vancomycin and Zosyn.   Severe Hypoxic and Hypercapnic Respiratory Failure-new RLL pneumonia -continue Full MV support -continue Bronchodilator Therapy -Wean Fio2 and PEEP as tolerated   Renal Failure-most likely due to ATN -follow chem 7 -follow UO -continue Foley Catheter-assess need   NEUROLOGY - intubated and sedated - minimal sedation to achieve a RASS goal: -1    CARDIAC ICU monitoring  ID -continue IV abx as  prescibed -follow up cultures   DVT/GI PRX ordered TRANSFUSIONS AS NEEDED MONITOR FSBS ASSESS the need for LABS as needed   Critical Care Time devoted to patient care services described in this note is 34 minutes.   Overall, patient is critically ill, prognosis is guarded.  Patient with Multiorgan failure and at high risk for cardiac arrest and death.    Corrin Parker, M.D.  [facility]  Medical Director [place] Director [facility] Cardio-Pulmonary Department

## 2017-12-06 NOTE — Clinical Social Work Note (Signed)
Plans for patient continue to be return home with services however, there is concern that patient's family is trying to delay progression of this. The home visits have had to be rescheduled (family reporting confusion on time it was scheduled) , the training has had to be rescheduled (family reporting confusion on time training was scheduled), and now the family states that the ramp will not be completed by Monday (it was supposed to be completed this week). CSW remains updating [facility].  Shela Leff MSW,LCSW [phone]

## 2017-12-06 NOTE — Clinical Social Work Note (Signed)
CSW spoke with Director of CSW and she approved for a deadline to be given to the daughters. CSW attempted to reach both [place] and [name] however Ileene Rubens voicemail is full and [name]'s phone goes straight to voicemail. CSW left voicemail on [name]'s phone. The deadline is that patient's family will need to have home ready on Monday, training on Tuesday and then discharge on Wednesday. If they are not able to meet that deadline, then family will need to know that transportation for transferring to vent/snf will be arranged. Shela Leff MSW,LcSW [phone]

## 2017-12-06 NOTE — Clinical Social Work Note (Signed)
CSW spoke with [name], patient's daughter, and discussed that if the plans for patient to return home fall through and she doesn't go home on Wednesday, we will need to proceed with transfer to vent/snf. [place] verbalized understanding. Shela Leff MSW,LcSW [phone]

## 2017-12-07 ENCOUNTER — Inpatient Hospital Stay: Payer: Medicaid Other

## 2017-12-07 LAB — COMPREHENSIVE METABOLIC PANEL
ALT: 8 U/L (ref 0–44)
AST: 18 U/L (ref 15–41)
Albumin: 2 g/dL — ABNORMAL LOW (ref 3.5–5.0)
Alkaline Phosphatase: 44 U/L (ref 38–126)
Anion gap: 8 (ref 5–15)
BILIRUBIN TOTAL: 0.1 mg/dL — AB (ref 0.3–1.2)
BUN: 21 mg/dL — ABNORMAL HIGH (ref 6–20)
CHLORIDE: 101 mmol/L (ref 98–111)
CO2: 32 mmol/L (ref 22–32)
Calcium: 8.6 mg/dL — ABNORMAL LOW (ref 8.9–10.3)
Creatinine, Ser: <0.3 mg/dL — ABNORMAL LOW (ref 0.44–1.00)
Glucose, Bld: 99 mg/dL (ref 70–99)
POTASSIUM: 4.5 mmol/L (ref 3.5–5.1)
Sodium: 141 mmol/L (ref 135–145)
TOTAL PROTEIN: 6.6 g/dL (ref 6.5–8.1)

## 2017-12-07 LAB — CBC
HEMATOCRIT: 25.8 % — AB (ref 35.0–47.0)
Hemoglobin: 8.2 g/dL — ABNORMAL LOW (ref 12.0–16.0)
MCH: 27.2 pg (ref 26.0–34.0)
MCHC: 31.6 g/dL — AB (ref 32.0–36.0)
MCV: 85.9 fL (ref 80.0–100.0)
PLATELETS: 284 10*3/uL (ref 150–440)
RBC: 3.01 MIL/uL — ABNORMAL LOW (ref 3.80–5.20)
RDW: 18 % — AB (ref 11.5–14.5)
WBC: 7.4 10*3/uL (ref 3.6–11.0)

## 2017-12-07 LAB — GLUCOSE, CAPILLARY
GLUCOSE-CAPILLARY: 101 mg/dL — AB (ref 70–99)
GLUCOSE-CAPILLARY: 110 mg/dL — AB (ref 70–99)
GLUCOSE-CAPILLARY: 85 mg/dL (ref 70–99)

## 2017-12-07 LAB — PROCALCITONIN: Procalcitonin: <0.1 ng/mL

## 2017-12-07 NOTE — Progress Notes (Signed)
CRITICAL CARE NOTE  CC  Vent support Follow up resp failure  SUBJECTIVE Remains on vent needs vent to survive Being treated to staph aureus pneumonia   S/p Trach and s/p PEG tube    BP 121/76   Pulse 100   Temp 100 F (37.8 C) (Oral)   Resp (!) 22   Ht 4\' 9"  (1.448 m)   Wt 125 lb 3.5 oz (56.8 kg)   LMP 03/06/2005 (Approximate)   SpO2 94%   BMI 27.10 kg/m    REVIEW OF SYSTEMS  PATIENT IS UNABLE TO PROVIDE COMPLETE REVIEW OF SYSTEM S DUE TO SEVERE CRITICAL ILLNESS AND ENCEPHALOPATHY   PHYSICAL EXAMINATION:  GENERAL:critically ill appearing HEAD: Normocephalic, atraumatic.  EYES: Pupils equal, round, reactive to light.  No scleral icterus.  MOUTH: Moist mucosal membrane. NECK: Supple. No thyromegaly. No nodules. No JVD.  PULMONARY: +rhonchi, +wheezing CARDIOVASCULAR: S1 and S2. Regular rate and rhythm. No murmurs, rubs, or gallops.  GASTROINTESTINAL: Soft, nontender, -distended. No masses. Positive bowel sounds. No hepatosplenomegaly.  MUSCULOSKELETAL: No swelling, clubbing, or edema.  NEUROLOGIC: alert and awake this AM SKIN:intact,warm,dry  ASSESSMENT AND PLAN 57 y.o. female.with severe COPD, prolonged mechanical ventilation, status post tracheostomy, with multiple episodes of pneumonia Ventilator dependent respiratory failure secondary to end-stage COPD with multiple episodes of pneumonia.    Severe Hypoxic and Hypercapnic Respiratory Failure-new RLL pneumonia -failure to wean from vent -continue Full MV support -continue Bronchodilator Therapy   NEUROLOGY Avoid sedatives  CARDIAC ICU monitoring  ID -continue IV abx as prescibed -follow up cultures   DVT/GI PRX ordered TRANSFUSIONS AS NEEDED MONITOR FSBS ASSESS the need for LABS as needed   Critical Care Time devoted to patient care services described in this note is 31 minutes.   Home with Vent support VS LTACH/SNF   Corrin Parker, M.D.  [facility]  Medical Director [place] Director [facility] Cardio-Pulmonary Department

## 2017-12-07 NOTE — Progress Notes (Signed)
Patient ID: Michelle [name], female   DOB: [birth year], 57 y.o.   MRN: [id number]   [employer] PROGRESS NOTE  Michelle [name] WJX:[id number] DOB: September 25, 1960 DOA: 09/30/2017 PCP: [facility] [place]  HPI/Subjective: Patient more alert today and able to lift forearms up off the bed today.  Hard to understand but she is trying to talk.  Objective: Vitals:   12/07/17 1300 12/07/17 1400  BP: 125/77 119/66  Pulse: 95 100  Resp: 20 17  Temp:    SpO2: 94% 94%    Filed Weights   12/01/17 0400 12/02/17 0247 12/03/17 0400  Weight: 58.1 kg (128 lb 1.4 oz) 58.1 kg (128 lb 1.4 oz) 56.8 kg (125 lb 3.5 oz)    ROS: Review of Systems  Unable to perform ROS: Acuity of condition  Respiratory: Negative for shortness of breath.   Cardiovascular: Negative for chest pain.  Gastrointestinal: Negative for abdominal pain.   Exam: Physical Exam  HENT:  Nose: No mucosal edema.  Thrush on the tongue.  Eyes: Pupils are equal, round, and reactive to light. Conjunctivae and lids are normal.  Neck: No JVD present. Carotid bruit is not present. No edema present. No thyroid mass and no thyromegaly present.  Cardiovascular: S1 normal and S2 normal. Exam reveals no gallop.  No murmur heard. Pulses:      Dorsalis pedis pulses are 2+ on the right side, and 2+ on the left side.  Respiratory: No respiratory distress. She has decreased breath sounds in the right lower field and the left lower field. She has no wheezes. She has rhonchi in the right lower field and the left lower field. She has no rales.  GI: Soft. Bowel sounds are normal. There is no tenderness.  Musculoskeletal:       Right ankle: She exhibits no swelling.       Left ankle: She exhibits no swelling.  Lymphadenopathy:    She has no cervical adenopathy.  Neurological: She is alert.  Unable to lift up her legs up off the bed.  Able to lift forearms up off the bed and squeeze my hands.  Able to wiggle toes.  Skin: Skin is warm. No rash  noted. Nails show no clubbing.  Psychiatric: She has a normal mood and affect.      Data Reviewed: Basic Metabolic Panel: Recent Labs  Lab 12/02/17 1420 12/03/17 1103 12/07/17 0545  NA 138 140 141  K 4.6 4.5 4.5  CL 99 102 101  CO2 34* 31 32  GLUCOSE 109* 108* 99  BUN 14 19 21*  CREATININE <0.30* <0.30* <0.30*  CALCIUM 8.8* 8.5* 8.6*  MG 1.7  --   --   PHOS 4.7*  --   --    Liver Function Tests: Recent Labs  Lab 12/07/17 0545  AST 18  ALT 8  ALKPHOS 44  BILITOT 0.1*  PROT 6.6  ALBUMIN 2.0*    Recent Labs  Lab 12/03/17 1103  AMMONIA 26   CBC: Recent Labs  Lab 12/03/17 1252 12/05/17 0416 12/06/17 0537 12/07/17 0545  WBC 13.0* 7.7 6.3 7.4  NEUTROABS  --  4.2  --   --   HGB 7.9* 8.5* 7.5* 8.2*  HCT 25.5* 27.2* 24.3* 25.8*  MCV 86.8 86.3 87.2 85.9  PLT 270 252 263 284   BNP (last 3 results) Recent Labs    06/29/17 0412 08/20/17 1337 09/30/17 1705  BNP 38.0 33.0 66.0    CBG: Recent Labs  Lab 12/05/17 2359 12/06/17  7078 12/06/17 1639 12/07/17 0000 12/07/17 0803  GLUCAP 106* 104* 96 85 101*    Recent Results (from the past 240 hour(s))  MRSA PCR Screening     Status: None   Collection Time: 11/30/17 10:16 AM  Result Value Ref Range Status   MRSA by PCR NEGATIVE NEGATIVE Final    Comment:        The GeneXpert MRSA Assay (FDA approved for NASAL specimens only), is one component of a comprehensive MRSA colonization surveillance program. It is not intended to diagnose MRSA infection nor to guide or monitor treatment for MRSA infections. Performed at [facility], [address] [place], [place] [address]   Urine Culture     Status: Abnormal   Collection Time: 11/30/17  9:31 PM  Result Value Ref Range Status   Specimen Description   Final    URINE, RANDOM Performed at [facility], [address], [place], [place] [address]    Special Requests   Final    NONE Performed at [facility], [address] [place], [place] [address]    Culture (A)  Final    <10,000 COLONIES/mL INSIGNIFICANT GROWTH Performed at [facility], [place] [address], [place], [place] [address]    Report Status 12/02/2017 FINAL  Final  Culture, respiratory (NON-Expectorated)     Status: None   Collection Time: 12/04/17 10:38 AM  Result Value Ref Range Status   Specimen Description   Final    TRACHEAL ASPIRATE Performed at [facility], [address], [place], [place] [address]    Special Requests   Final    NONE Performed at [facility], [address] [place], [place] [address]    Gram Stain   Final    MODERATE WBC PRESENT, PREDOMINANTLY PMN FEW GRAM POSITIVE COCCI RARE GRAM NEGATIVE RODS Performed at [facility], [place] [address] [place], [place] [address]    Culture ABUNDANT STAPHYLOCOCCUS AUREUS  Final   Report Status 12/06/2017 FINAL  Final   Organism ID, Bacteria STAPHYLOCOCCUS AUREUS  Final      Susceptibility   Staphylococcus aureus - MIC*    CIPROFLOXACIN <=0.5 SENSITIVE Sensitive     ERYTHROMYCIN <=0.25 SENSITIVE Sensitive     GENTAMICIN <=0.5 SENSITIVE Sensitive     OXACILLIN <=0.25 SENSITIVE Sensitive     TETRACYCLINE <=1 SENSITIVE Sensitive     VANCOMYCIN <=0.5 SENSITIVE Sensitive     TRIMETH/SULFA <=10 SENSITIVE Sensitive     CLINDAMYCIN <=0.25 SENSITIVE Sensitive     RIFAMPIN <=0.5 SENSITIVE Sensitive     Inducible Clindamycin NEGATIVE Sensitive     * ABUNDANT STAPHYLOCOCCUS AUREUS     Studies: Dg Chest Port 1 View  Result Date: 12/07/2017 CLINICAL DATA:  Acute respiratory failure EXAM: PORTABLE CHEST 1 VIEW COMPARISON:  December 04, 2017 FINDINGS: Right PICC line and tracheostomy tube are in good position. No pneumothorax. Small bilateral pleural effusions persist. The opacity in the right base seen previously has resolved. Retrocardiac opacity is stable. Mild pulmonary venous congestion. IMPRESSION: 1. Stable support apparatus as above. 2. Persistent  left retrocardiac opacity. 3. Stable small pleural effusions. 4. Resolution of right basilar opacity. 5. Pulmonary venous congestion. Electronically Signed   By: Dorise Bullion III M.D   On: 12/07/2017 06:55   Dg Ankle Right Port  Result Date: 12/06/2017 CLINICAL DATA:  Right ankle pain. EXAM: PORTABLE RIGHT ANKLE - 2 VIEW COMPARISON:  No recent. FINDINGS: Soft tissue swelling noted about the  right ankle. No evidence of fracture or dislocation. No acute bony abnormality identified. IMPRESSION: Diffuse soft tissue swelling.  No acute bony abnormality. Electronically Signed   By: Jakin   On: 12/06/2017 06:52    Scheduled Meds: . alteplase  2 mg Intracatheter Once  . alteplase  2 mg Intracatheter Once  . budesonide (PULMICORT) nebulizer solution  0.5 mg Nebulization BID  . chlorhexidine gluconate (MEDLINE KIT)  15 mL Mouth Rinse BID  . clonazePAM  0.5 mg Oral Daily  . clonazePAM  1 mg Per Tube QHS  . enoxaparin (LOVENOX) injection  40 mg Subcutaneous Q24H  . famotidine  20 mg Per Tube Daily  . feeding supplement (PRO-STAT SUGAR FREE 64)  30 mL Per Tube BID  . fentaNYL  50 mcg Transdermal Q72H  . free water  50 mL Per Tube Q8H  . ipratropium-albuterol  3 mL Nebulization Q6H  . lactobacillus  1 g Per Tube TID WC  . mouth rinse  15 mL Mouth Rinse 10 times per day  . multivitamin  15 mL Per Tube Daily  . nystatin  5 mL Oral QID  . nystatin cream   Topical BID  . PARoxetine  20 mg Per Tube BID  . potassium chloride  20 mEq Per Tube BID  . QUEtiapine  25 mg Per Tube QHS  . senna-docusate  1 tablet Per Tube BID  . sodium chloride flush  10-40 mL Intracatheter Q12H  . valproic acid  500 mg Per Tube BID   Continuous Infusions: . ciprofloxacin Stopped (12/07/17 0714)  . feeding supplement (JEVITY 1.5 CAL/FIBER) 1,000 mL (12/06/17 0500)    Assessment/Plan:  1. Ventilator associated pneumonia.  Staph aureus and a gram-negative rod growing out of cultures.  Patient switched to Cipro  for antibiotics. 2. Acute on chronic hypoxic respiratory failure status post tracheostomy on ventilator support.  Unable to wean from ventilator.  FiO2 at 30%. 3. Critical illness polyneuropathy.  Patient today able to lift her forearms up off the bed. 4. Severe anxiety on Xanax Klonopin and Depakote 5. COPD on nebulizer treatments 6. Stage II decubitus ulcer on sacrum.  Local wound care 7. Anemia of chronic disease.  Hemoglobin 8.2 today 8. Nutrition.  Continue tube feeding as per dietary.   Code Status:     Code Status Orders  (From admission, onward)        Start     Ordered   10/28/17 1707  Limited resuscitation (code)  Continuous    Question Answer Comment  In the event of cardiac or respiratory ARREST: Initiate Code Blue, Call Rapid Response No   In the event of cardiac or respiratory ARREST: Perform CPR No   In the event of cardiac or respiratory ARREST: Perform Intubation/Mechanical Ventilation Yes   In the event of cardiac or respiratory ARREST: Use NIPPV/BiPAp only if indicated No   In the event of cardiac or respiratory ARREST: Administer ACLS medications if indicated No   In the event of cardiac or respiratory ARREST: Perform Defibrillation or Cardioversion if indicated No   Comments Continue trach and ventilator. No resusitation if heart stops.      10/28/17 1711    Code Status History    Date Active Date Inactive Code Status Order ID Comments User Context   09/30/2017 1945 10/28/2017 1711 Full Code [id number]  [name], MD Inpatient   08/20/2017 1713 08/24/2017 1743 Full Code [id number]  Dustin Flock, MD Inpatient   06/29/2017 [id number] 07/02/2017 2008 Full Code  [id number]  Harrie Foreman, MD Inpatient   02/23/2017 0241 02/26/2017 1634 Full Code [id number]  Harrie Foreman, MD Inpatient   01/01/2017 1144 01/04/2017 1354 Full Code [id number]  Bettey Costa, MD Inpatient   05/22/2016 2155 05/27/2016 1645 Full Code [id number]  Fritzi Mandes, MD Inpatient   04/23/2016  2020 04/27/2016 1848 Full Code [id number]  Hower, Aaron Mose, MD ED   02/07/2016 1922 02/10/2016 1835 Full Code [id number]  Gladstone Lighter, MD Inpatient   11/29/2015 0519 12/01/2015 1609 Full Code [id number]  Harrie Foreman, MD Inpatient   11/05/2015 0911 11/07/2015 1606 Full Code [id number]  Hillary Bow, MD ED   10/24/2014 1522 10/27/2014 1630 Full Code [id number]  Hillary Bow, MD Inpatient     Family Communication: As per critical care specialist Disposition Plan: On Wednesday the patient will be either discharged home with palliative care and home health or to [facility] facility.  Antibiotics:   Cipro  Time spent: 26 minutes, case discussed with  critical care specialist and nursing staff.  Lynton Crescenzo [employer]

## 2017-12-08 LAB — GLUCOSE, CAPILLARY
GLUCOSE-CAPILLARY: 108 mg/dL — AB (ref 70–99)
GLUCOSE-CAPILLARY: 108 mg/dL — AB (ref 70–99)
Glucose-Capillary: 81 mg/dL (ref 70–99)
Glucose-Capillary: 92 mg/dL (ref 70–99)

## 2017-12-08 LAB — PROCALCITONIN: Procalcitonin: <0.1 ng/mL

## 2017-12-08 MED ORDER — SACCHAROMYCES BOULARDII 250 MG PO CAPS
250.0000 mg | ORAL_CAPSULE | Freq: Two times a day (BID) | ORAL | Status: DC
  Administered 2017-12-08 – 2017-12-31 (×47): 250 mg via ORAL
  Filled 2017-12-08 (×48): qty 1

## 2017-12-08 MED ORDER — SODIUM CHLORIDE 0.9 % IV SOLN
INTRAVENOUS | Status: DC
  Administered 2017-12-08 – 2017-12-09 (×2): 250 mL via INTRAVENOUS

## 2017-12-08 NOTE — Progress Notes (Signed)
Patient ID: Michelle [name], female   DOB: 1960-08-19, 57 y.o.   MRN: [id number]   [employer] PROGRESS NOTE  Michelle [name] TMA:[id number] DOB: 1960-10-09 DOA: 09/30/2017 PCP: [facility] [place]  HPI/Subjective: Patient alert today and tries to answer few questions but unable to understand her.  Able to squeeze my hands and wiggle toes today.  Objective: Vitals:   12/08/17 1300 12/08/17 1400  BP: 101/64 94/63  Pulse: 81 82  Resp: 17 16  Temp:    SpO2: 94% 94%    Filed Weights   12/01/17 0400 12/02/17 0247 12/03/17 0400  Weight: 58.1 kg (128 lb 1.4 oz) 58.1 kg (128 lb 1.4 oz) 56.8 kg (125 lb 3.5 oz)    ROS: Review of Systems  Unable to perform ROS: Acuity of condition  Respiratory: Negative for shortness of breath.   Cardiovascular: Negative for chest pain.  Gastrointestinal: Negative for abdominal pain.   Exam: Physical Exam  HENT:  Nose: No mucosal edema.  Eyes: Pupils are equal, round, and reactive to light. Conjunctivae and lids are normal.  Neck: No JVD present. Carotid bruit is not present. No edema present. No thyroid mass and no thyromegaly present.  Cardiovascular: S1 normal and S2 normal. Exam reveals no gallop.  No murmur heard. Pulses:      Dorsalis pedis pulses are 2+ on the right side, and 2+ on the left side.  Respiratory: No respiratory distress. She has decreased breath sounds in the right lower field and the left lower field. She has no wheezes. She has rhonchi in the right lower field and the left lower field. She has no rales.  GI: Soft. Bowel sounds are normal. There is no tenderness.  Musculoskeletal:       Right ankle: She exhibits no swelling.       Left ankle: She exhibits no swelling.  Lymphadenopathy:    She has no cervical adenopathy.  Neurological: She is alert.  Unable to lift up her legs up off the bed.  Able to lift forearms up off the bed and squeeze my hands.  Able to wiggle toes.  Skin: Skin is warm. No rash noted. Nails  show no clubbing.  Psychiatric: She has a normal mood and affect.      Data Reviewed: Basic Metabolic Panel: Recent Labs  Lab 12/02/17 1420 12/03/17 1103 12/07/17 0545  NA 138 140 141  K 4.6 4.5 4.5  CL 99 102 101  CO2 34* 31 32  GLUCOSE 109* 108* 99  BUN 14 19 21*  CREATININE <0.30* <0.30* <0.30*  CALCIUM 8.8* 8.5* 8.6*  MG 1.7  --   --   PHOS 4.7*  --   --    Liver Function Tests: Recent Labs  Lab 12/07/17 0545  AST 18  ALT 8  ALKPHOS 44  BILITOT 0.1*  PROT 6.6  ALBUMIN 2.0*    Recent Labs  Lab 12/03/17 1103  AMMONIA 26   CBC: Recent Labs  Lab 12/03/17 1252 12/05/17 0416 12/06/17 0537 12/07/17 0545  WBC 13.0* 7.7 6.3 7.4  NEUTROABS  --  4.2  --   --   HGB 7.9* 8.5* 7.5* 8.2*  HCT 25.5* 27.2* 24.3* 25.8*  MCV 86.8 86.3 87.2 85.9  PLT 270 252 263 284   BNP (last 3 results) Recent Labs    06/29/17 0412 08/20/17 1337 09/30/17 1705  BNP 38.0 33.0 66.0    CBG: Recent Labs  Lab 12/07/17 0000 12/07/17 0803 12/07/17 1605 12/08/17 0057  12/08/17 0723  GLUCAP 85 101* 110* 108* 81    Recent Results (from the past 240 hour(s))  MRSA PCR Screening     Status: None   Collection Time: 11/30/17 10:16 AM  Result Value Ref Range Status   MRSA by PCR NEGATIVE NEGATIVE Final    Comment:        The GeneXpert MRSA Assay (FDA approved for NASAL specimens only), is one component of a comprehensive MRSA colonization surveillance program. It is not intended to diagnose MRSA infection nor to guide or monitor treatment for MRSA infections. Performed at [facility], [address], [place], [place] [address]   Urine Culture     Status: Abnormal   Collection Time: 11/30/17  9:31 PM  Result Value Ref Range Status   Specimen Description   Final    URINE, RANDOM Performed at [facility], [address] [place], [place] [address]    Special Requests   Final    NONE Performed at [facility], [place]  [place], [place] [address]    Culture (A)  Final    <10,000 COLONIES/mL INSIGNIFICANT GROWTH Performed at [facility], [place] [address] [place], [place] [address]    Report Status 12/02/2017 FINAL  Final  Culture, respiratory (NON-Expectorated)     Status: None   Collection Time: 12/04/17 10:38 AM  Result Value Ref Range Status   Specimen Description   Final    TRACHEAL ASPIRATE Performed at [facility], [address] [place], [place] [address]    Special Requests   Final    NONE Performed at [facility], [address] [place], [place] [address]    Gram Stain   Final    MODERATE WBC PRESENT, PREDOMINANTLY PMN FEW GRAM POSITIVE COCCI RARE GRAM NEGATIVE RODS Performed at [facility], [place] [address] [place], [place] [address]    Culture ABUNDANT STAPHYLOCOCCUS AUREUS  Final   Report Status 12/06/2017 FINAL  Final   Organism ID, Bacteria STAPHYLOCOCCUS AUREUS  Final      Susceptibility   Staphylococcus aureus - MIC*    CIPROFLOXACIN <=0.5 SENSITIVE Sensitive     ERYTHROMYCIN <=0.25 SENSITIVE Sensitive     GENTAMICIN <=0.5 SENSITIVE Sensitive     OXACILLIN <=0.25 SENSITIVE Sensitive     TETRACYCLINE <=1 SENSITIVE Sensitive     VANCOMYCIN <=0.5 SENSITIVE Sensitive     TRIMETH/SULFA <=10 SENSITIVE Sensitive     CLINDAMYCIN <=0.25 SENSITIVE Sensitive     RIFAMPIN <=0.5 SENSITIVE Sensitive     Inducible Clindamycin NEGATIVE Sensitive     * ABUNDANT STAPHYLOCOCCUS AUREUS     Studies: Dg Chest Port 1 View  Result Date: 12/07/2017 CLINICAL DATA:  Acute respiratory failure EXAM: PORTABLE CHEST 1 VIEW COMPARISON:  December 04, 2017 FINDINGS: Right PICC line and tracheostomy tube are in good position. No pneumothorax. Small bilateral pleural effusions persist. The opacity in the right base seen previously has resolved. Retrocardiac opacity is stable. Mild pulmonary venous congestion. IMPRESSION: 1. Stable support apparatus as above. 2. Persistent left  retrocardiac opacity. 3. Stable small pleural effusions. 4. Resolution of right basilar opacity. 5. Pulmonary venous congestion. Electronically Signed   By: Dorise Bullion III M.D   On: 12/07/2017 06:55    Scheduled Meds: . budesonide (PULMICORT) nebulizer solution  0.5 mg Nebulization BID  . chlorhexidine gluconate (MEDLINE KIT)  15 mL Mouth Rinse BID  . clonazePAM  0.5 mg Oral Daily  . clonazePAM  1 mg Per Tube QHS  . enoxaparin (LOVENOX) injection  40 mg Subcutaneous Q24H  . famotidine  20 mg Per Tube Daily  . feeding supplement (PRO-STAT SUGAR FREE 64)  30 mL Per Tube BID  . fentaNYL  50 mcg Transdermal Q72H  . free water  50 mL Per Tube Q8H  . ipratropium-albuterol  3 mL Nebulization Q6H  . mouth rinse  15 mL Mouth Rinse 10 times per day  . multivitamin  15 mL Per Tube Daily  . nystatin  5 mL Oral QID  . nystatin cream   Topical BID  . PARoxetine  20 mg Per Tube BID  . potassium chloride  20 mEq Per Tube BID  . QUEtiapine  25 mg Per Tube QHS  . saccharomyces boulardii  250 mg Oral BID  . senna-docusate  1 tablet Per Tube BID  . sodium chloride flush  10-40 mL Intracatheter Q12H  . valproic acid  500 mg Per Tube BID   Continuous Infusions: . sodium chloride 5 mL/hr at 12/08/17 1400  . ciprofloxacin Stopped (12/08/17 1761)  . feeding supplement (JEVITY 1.5 CAL/FIBER) 1,000 mL (12/08/17 0520)    Assessment/Plan:  1. Ventilator associated pneumonia.  Staph aureus and a gram-negative rod growing out of cultures.  Patient switched to Cipro. 2. Acute on chronic hypoxic respiratory failure status post tracheostomy on ventilator support.  Unable to wean from ventilator.  FiO2 at 30%. 3. Critical illness polyneuropathy.  Patient today able to squeeze my hands and wiggle toes. 4. Severe anxiety on Xanax Klonopin and Depakote 5. COPD on nebulizer treatments 6. Stage II decubitus ulcer on sacrum.  Local wound care 7. Anemia of chronic disease.  Hemoglobin 8.2. 8. Nutrition.   Continue tube feeding as per dietary.   Code Status:     Code Status Orders  (From admission, onward)        Start     Ordered   10/28/17 1707  Limited resuscitation (code)  Continuous    Question Answer Comment  In the event of cardiac or respiratory ARREST: Initiate Code Blue, Call Rapid Response No   In the event of cardiac or respiratory ARREST: Perform CPR No   In the event of cardiac or respiratory ARREST: Perform Intubation/Mechanical Ventilation Yes   In the event of cardiac or respiratory ARREST: Use NIPPV/BiPAp only if indicated No   In the event of cardiac or respiratory ARREST: Administer ACLS medications if indicated No   In the event of cardiac or respiratory ARREST: Perform Defibrillation or Cardioversion if indicated No   Comments Continue trach and ventilator. No resusitation if heart stops.      10/28/17 1711    Code Status History    Date Active Date Inactive Code Status Order ID Comments User Context   09/30/2017 1945 10/28/2017 1711 Full Code [id number]  [name], MD Inpatient   08/20/2017 1713 08/24/2017 1743 Full Code [id number]  Dustin Flock, MD Inpatient   06/29/2017 0625 07/02/2017 2008 Full Code [id number]  Harrie Foreman, MD Inpatient   02/23/2017 0241 02/26/2017 1634 Full Code [id number]  Harrie Foreman, MD Inpatient   01/01/2017 1144 01/04/2017 1354 Full Code [id number]  Bettey Costa, MD Inpatient   05/22/2016 2155 05/27/2016 1645 Full Code [id number]  Fritzi Mandes, MD Inpatient   04/23/2016 2020 04/27/2016 1848 Full Code [id number]  Hower, Aaron Mose, MD ED   02/07/2016 1922 02/10/2016 [place] Full Code [id number]  Gladstone Lighter, MD Inpatient   11/29/2015 0519 12/01/2015 1609 Full Code  [id number]  Harrie Foreman, MD Inpatient   11/05/2015 0911 11/07/2015 1606 Full Code [id number]  Hillary Bow, MD ED   10/24/2014 1522 10/27/2014 1630 Full Code [id number]  Hillary Bow, MD Inpatient     Family Communication: As per critical care specialist Disposition  Plan: On Wednesday the patient will be either discharged home with palliative care and home health or to [facility] facility.  Antibiotics:   Cipro  Time spent: 24 minutes, case discussed with  critical care specialist and nursing staff.  Helyne Genther [employer]

## 2017-12-08 NOTE — Progress Notes (Addendum)
Placed patient into PSV of 10/5 for weaning trial

## 2017-12-09 LAB — GLUCOSE, CAPILLARY
GLUCOSE-CAPILLARY: 105 mg/dL — AB (ref 70–99)
GLUCOSE-CAPILLARY: 90 mg/dL (ref 70–99)
Glucose-Capillary: 94 mg/dL (ref 70–99)

## 2017-12-09 LAB — PROCALCITONIN

## 2017-12-09 MED ORDER — CIPROFLOXACIN HCL 500 MG PO TABS
500.0000 mg | ORAL_TABLET | Freq: Two times a day (BID) | ORAL | Status: DC
  Administered 2017-12-09 – 2017-12-14 (×10): 500 mg
  Filled 2017-12-09 (×11): qty 1

## 2017-12-09 NOTE — Progress Notes (Signed)
   12/09/17 0800  Tracheostomy Shiley 6 mm Cuffed  Placement Date/Time: 10/17/17 1140   Placed By: (c) Other (Comment)  Brand: Shiley  Size (mm): 6 mm  Style: Cuffed  Serial / Lot #: [id number]  Expiration Date: 12/25/21  Status Secured  Site Assessment Dry  Ties Assessment Clean;Secure  Tracheostomy Equipment at bedside Yes and checklist posted at head of bed  Adult Ventilator Measurements  SpO2 96 %  Pre-WUA / WUA Start  Pre WUA sedation dose No sedation - end of WUA  Breath Sounds  Bilateral Breath Sounds Diminished;Clear  R Upper  Breath Sounds Clear;Diminished  L Upper Breath Sounds Clear;Diminished  R Lower Breath Sounds Clear;Diminished  L Lower Breath Sounds Clear;Diminished

## 2017-12-09 NOTE — Progress Notes (Signed)
CRITICAL CARE NOTE  CC  Vent support Follow up resp failure  SUBJECTIVE needs vent to survive Being treated to staph aureus pneumonia Plan for PS mode as tolerated  S/p Trach and s/p PEG tube    BP 112/75   Pulse 78   Temp 99.5 F (37.5 C) (Oral)   Resp 17   Ht 4\' 9"  (1.448 m)   Wt 125 lb 3.5 oz (56.8 kg)   LMP 03/06/2005 (Approximate)   SpO2 97%   BMI 27.10 kg/m    REVIEW OF SYSTEMS  PATIENT IS UNABLE TO PROVIDE COMPLETE REVIEW OF SYSTEM S DUE TO SEVERE CRITICAL ILLNESS AND ENCEPHALOPATHY   PHYSICAL EXAMINATION:  GENERAL:critically ill appearing HEAD: Normocephalic, atraumatic.  EYES: Pupils equal, round, reactive to light.  No scleral icterus.  MOUTH: Moist mucosal membrane. NECK: Supple. No thyromegaly. No nodules. No JVD.  PULMONARY: +rhonchi, +wheezing CARDIOVASCULAR: S1 and S2. Regular rate and rhythm. No murmurs, rubs, or gallops.  GASTROINTESTINAL: Soft, nontender, -distended. No masses. Positive bowel sounds. No hepatosplenomegaly.  MUSCULOSKELETAL: No swelling, clubbing, or edema.  NEUROLOGIC: alert and awake this AM SKIN:intact,warm,dry  ASSESSMENT AND PLAN 57 y.o. female.with severe COPD, prolonged mechanical ventilation, status post tracheostomy, with multiple episodes of pneumonia Ventilator dependent respiratory failure secondary to end-stage COPD with multiple episodes of pneumonia.    Severe Hypoxic and Hypercapnic Respiratory Failure-new RLL pneumonia -failure to wean from vent -continue Full MV support -continue Bronchodilator Therapy   NEUROLOGY Avoid sedatives  CARDIAC ICU monitoring  ID -continue IV abx as prescibed -follow up cultures   DVT/GI PRX ordered TRANSFUSIONS AS NEEDED MONITOR FSBS ASSESS the need for LABS as needed    Home with Vent support VS LTACH/SNF   Corrin Parker, M.D.  [facility]  Medical Director [place] Director [facility]io-Pulmonary  Department

## 2017-12-09 NOTE — Progress Notes (Signed)
Patient ID: Michelle [name], female   DOB: 15-May-1961, 57 y.o.   MRN: [id number]   [facility] PROGRESS NOTE  Michelle [name] HYW:[id number] DOB: [birth year] DOA: 09/30/2017 PCP: [facility] [place]  HPI/Subjective: Patient shakes her head no to a couple questions.  Able to squeeze my hands.  Objective: Vitals:   12/09/17 0900 12/09/17 1323  BP: 118/69   Pulse: (!) 101   Resp: 20   Temp:    SpO2: 90% 96%    Filed Weights   12/01/17 0400 12/02/17 0247 12/03/17 0400  Weight: 58.1 kg (128 lb 1.4 oz) 58.1 kg (128 lb 1.4 oz) 56.8 kg (125 lb 3.5 oz)    ROS: Review of Systems  Unable to perform ROS: Acuity of condition  Respiratory: Negative for shortness of breath.   Cardiovascular: Negative for chest pain.  Gastrointestinal: Negative for abdominal pain.   Exam: Physical Exam  HENT:  Nose: No mucosal edema.  Eyes: Pupils are equal, round, and reactive to light. Conjunctivae and lids are normal.  Neck: No JVD present. Carotid bruit is not present. No edema present. No thyroid mass and no thyromegaly present.  Cardiovascular: S1 normal and S2 normal. Exam reveals no gallop.  No murmur heard. Pulses:      Dorsalis pedis pulses are 2+ on the right side, and 2+ on the left side.  Respiratory: No respiratory distress. She has decreased breath sounds in the right lower field and the left lower field. She has no wheezes. She has rhonchi in the right lower field and the left lower field. She has no rales.  GI: Soft. Bowel sounds are normal. There is no tenderness.  Musculoskeletal:       Right ankle: She exhibits no swelling.       Left ankle: She exhibits no swelling.  Lymphadenopathy:    She has no cervical adenopathy.  Neurological: She is alert.  Unable to lift up her legs up off the bed.  Able to lift forearms up off the bed and squeeze my hands.  Able to wiggle toes.  Skin: Skin is warm. No rash noted. Nails show no clubbing.  Psychiatric: She has a normal mood and  affect.      Data Reviewed: Basic Metabolic Panel: Recent Labs  Lab 12/03/17 1103 12/07/17 0545  NA 140 141  K 4.5 4.5  CL 102 101  CO2 31 32  GLUCOSE 108* 99  BUN 19 21*  CREATININE <0.30* <0.30*  CALCIUM 8.5* 8.6*   Liver Function Tests: Recent Labs  Lab 12/07/17 0545  AST 18  ALT 8  ALKPHOS 44  BILITOT 0.1*  PROT 6.6  ALBUMIN 2.0*    Recent Labs  Lab 12/03/17 1103  AMMONIA 26   CBC: Recent Labs  Lab 12/03/17 1252 12/05/17 0416 12/06/17 0537 12/07/17 0545  WBC 13.0* 7.7 6.3 7.4  NEUTROABS  --  4.2  --   --   HGB 7.9* 8.5* 7.5* 8.2*  HCT 25.5* 27.2* 24.3* 25.8*  MCV 86.8 86.3 87.2 85.9  PLT 270 252 263 284   BNP (last 3 results) Recent Labs    06/29/17 0412 08/20/17 1337 09/30/17 1705  BNP 38.0 33.0 66.0    CBG: Recent Labs  Lab 12/08/17 0057 12/08/17 0723 12/08/17 1548 12/08/17 2353 12/09/17 0714  GLUCAP 108* 81 108* 92 105*    Recent Results (from the past 240 hour(s))  MRSA PCR Screening     Status: None   Collection Time: 11/30/17 10:16 AM  Result Value Ref Range Status   MRSA by PCR NEGATIVE NEGATIVE Final    Comment:        The GeneXpert MRSA Assay (FDA approved for NASAL specimens only), is one component of a comprehensive MRSA colonization surveillance program. It is not intended to diagnose MRSA infection nor to guide or monitor treatment for MRSA infections. Performed at [facility], [address], [place], [place] [address]   Urine Culture     Status: Abnormal   Collection Time: 11/30/17  9:31 PM  Result Value Ref Range Status   Specimen Description   Final    URINE, RANDOM Performed at [facility], [address], [place], [place] [address]    Special Requests   Final    NONE Performed at [facility], [address] [place], [place] [address]    Culture (A)  Final    <10,000 COLONIES/mL INSIGNIFICANT GROWTH Performed at [facility], [place] [address]  [place], [place] [address]    Report Status 12/02/2017 FINAL  Final  Culture, respiratory (NON-Expectorated)     Status: None   Collection Time: 12/04/17 10:38 AM  Result Value Ref Range Status   Specimen Description   Final    TRACHEAL ASPIRATE Performed at [facility], [address], [place], [place] [address]    Special Requests   Final    NONE Performed at [facility], [address] [place], [place] [address]    Gram Stain   Final    MODERATE WBC PRESENT, PREDOMINANTLY PMN FEW GRAM POSITIVE COCCI RARE GRAM NEGATIVE RODS Performed at [facility], [place] [address] [place], [place] [address]    Culture ABUNDANT STAPHYLOCOCCUS AUREUS  Final   Report Status 12/06/2017 FINAL  Final   Organism ID, Bacteria STAPHYLOCOCCUS AUREUS  Final      Susceptibility   Staphylococcus aureus - MIC*    CIPROFLOXACIN <=0.5 SENSITIVE Sensitive     ERYTHROMYCIN <=0.25 SENSITIVE Sensitive     GENTAMICIN <=0.5 SENSITIVE Sensitive     OXACILLIN <=0.25 SENSITIVE Sensitive     TETRACYCLINE <=1 SENSITIVE Sensitive     VANCOMYCIN <=0.5 SENSITIVE Sensitive     TRIMETH/SULFA <=10 SENSITIVE Sensitive     CLINDAMYCIN <=0.25 SENSITIVE Sensitive     RIFAMPIN <=0.5 SENSITIVE Sensitive     Inducible Clindamycin NEGATIVE Sensitive     * ABUNDANT STAPHYLOCOCCUS AUREUS      Scheduled Meds: . budesonide (PULMICORT) nebulizer solution  0.5 mg Nebulization BID  . chlorhexidine gluconate (MEDLINE KIT)  15 mL Mouth Rinse BID  . clonazePAM  0.5 mg Oral Daily  . clonazePAM  1 mg Per Tube QHS  . enoxaparin (LOVENOX) injection  40 mg Subcutaneous Q24H  . famotidine  20 mg Per Tube Daily  . feeding supplement (PRO-STAT SUGAR FREE 64)  30 mL Per Tube BID  . fentaNYL  50 mcg Transdermal Q72H  . free water  50 mL Per Tube Q8H  . ipratropium-albuterol  3 mL Nebulization Q6H  . mouth rinse  15 mL Mouth Rinse 10 times per day  . multivitamin  15 mL Per Tube Daily  . nystatin  5 mL Oral QID  .  nystatin cream   Topical BID  . PARoxetine  20 mg Per Tube BID  . potassium chloride  20 mEq Per Tube BID  . QUEtiapine  25 mg Per Tube QHS  . saccharomyces boulardii  250 mg Oral BID  . senna-docusate  1 tablet Per Tube BID  . sodium chloride flush  10-40 mL Intracatheter Q12H  . valproic acid  500 mg Per Tube BID   Continuous Infusions: . sodium chloride 5 mL/hr at 12/09/17 0900  . ciprofloxacin Stopped (12/09/17 0715)  . feeding supplement (JEVITY 1.5 CAL/FIBER) 1,000 mL (12/09/17 0610)    Assessment/Plan:  1. Ventilator associated pneumonia.  Staph aureus and a gram-negative rod growing out of cultures.  Patient switched to Cipro. 2. Acute on chronic hypoxic respiratory failure status post tracheostomy on ventilator support.  Unable to wean from ventilator.  FiO2 at 30%. 3. Critical illness polyneuropathy.  Patient today able to squeeze my hands and wiggle toes. 4. Severe anxiety on Xanax Klonopin and Depakote 5. COPD on nebulizer treatments 6. Stage II decubitus ulcer on sacrum.  Local wound care 7. Anemia of chronic disease.  Hemoglobin 8.2. 8. Nutrition.  Continue tube feeding as per dietary.   Code Status:     Code Status Orders  (From admission, onward)        Start     Ordered   10/28/17 1707  Limited resuscitation (code)  Continuous    Question Answer Comment  In the event of cardiac or respiratory ARREST: Initiate Code Blue, Call Rapid Response No   In the event of cardiac or respiratory ARREST: Perform CPR No   In the event of cardiac or respiratory ARREST: Perform Intubation/Mechanical Ventilation Yes   In the event of cardiac or respiratory ARREST: Use NIPPV/BiPAp only if indicated No   In the event of cardiac or respiratory ARREST: Administer ACLS medications if indicated No   In the event of cardiac or respiratory ARREST: Perform Defibrillation or Cardioversion if indicated No   Comments Continue trach and ventilator. No resusitation if heart stops.       10/28/17 1711    Code Status History    Date Active Date Inactive Code Status Order ID Comments User Context   09/30/2017 1945 10/28/2017 1711 Full Code [id number]  [name], MD Inpatient   08/20/2017 1713 08/24/2017 1743 Full Code [id number]  Dustin Flock, MD Inpatient   06/29/2017 0625 07/02/2017 2008 Full Code [id number]  Harrie Foreman, MD Inpatient   02/23/2017 0241 02/26/2017 1634 Full Code [id number]  Harrie Foreman, MD Inpatient   01/01/2017 1144 01/04/2017 1354 Full Code [id number]  Bettey Costa, MD Inpatient   05/22/2016 2155 05/27/2016 1645 Full Code [id number]  Fritzi Mandes, MD Inpatient   04/23/2016 2020 04/27/2016 1848 Full Code [id number]  Hower, Aaron Mose, MD ED   02/07/2016 1922 02/10/2016 1835 Full Code [id number]  Gladstone Lighter, MD Inpatient   11/29/2015 0519 12/01/2015 1609 Full Code [id number]  Harrie Foreman, MD Inpatient   11/05/2015 0911 11/07/2015 1606 Full Code [id number]  Hillary Bow, MD ED   10/24/2014 1522 10/27/2014 1630 Full Code [id number]  Hillary Bow, MD Inpatient     Family Communication: As per critical care specialist Disposition Plan: On Wednesday the patient will be either discharged home with palliative care and home health or to [facility] facility.  Antibiotics:   Cipro  Time spent: 22 minutes.  Case discussed with critical care specialist.  [facility]

## 2017-12-09 NOTE — Progress Notes (Signed)
Patients WOB increased as well as RR after bath was completed. Patients saturation also dropped into the 70's. Placed patient back into her previous mode to rest.

## 2017-12-09 NOTE — Care Management (Addendum)
RNCM received notification from [place] with [facility] that they no longer have staff to meet patient's needs. They are concerned with road/driveway conditions and surrounding environment. RNCM has reached out to [facility]) [phone] and they will review. Fax [phone] for review to [place] with [facility]. Update at 1310: RNCM called Matt with [facility] to see if fax was received. [facility] transferred referral to [facility] [phone] fax [phone]. Eduard Clos is interested in case however they will not be able to accept patient until next week- after safety assessment, daughters both show proof of hours at work (both [name] and [place] have been informed of this multiple times), and vent assessment have been completed. Update at 245PM: [facility] did home assessment today and home passed. Vent training can be arranged when RNCM can confirm home health RN in place.  RNCM has faxed copy of [name]'s hours worked to [employer]- waiting on [place] number of work hours (letter she sent was hire date not hours worked). Update at 1537: RNCM has received Yasha's hours worked now and faxed to [employer].

## 2017-12-09 NOTE — Care Management (Signed)
Message sent to daughter Quincy Carnes with Vianne Bulls with [facility] for any updates.

## 2017-12-09 NOTE — Care Management (Signed)
RNCM will meet with Eduard Clos with [facility] at [facility] tomorrow. Both [name] and [name] have been notified about meeting tomorrow if they are interested and have any questions for [place].

## 2017-12-09 NOTE — Progress Notes (Signed)
Patient placed into PSV of 10/5 for trial

## 2017-12-10 ENCOUNTER — Inpatient Hospital Stay: Payer: Medicaid Other

## 2017-12-10 LAB — BASIC METABOLIC PANEL
ANION GAP: 4 — AB (ref 5–15)
BUN: 19 mg/dL (ref 6–20)
CHLORIDE: 103 mmol/L (ref 98–111)
CO2: 33 mmol/L — ABNORMAL HIGH (ref 22–32)
Calcium: 8.4 mg/dL — ABNORMAL LOW (ref 8.9–10.3)
Creatinine, Ser: 0.36 mg/dL — ABNORMAL LOW (ref 0.44–1.00)
GFR calc Af Amer: >60 mL/min (ref >60)
GFR calc non Af Amer: >60 mL/min (ref >60)
Glucose, Bld: 113 mg/dL — ABNORMAL HIGH (ref 70–99)
POTASSIUM: 4 mmol/L (ref 3.5–5.1)
SODIUM: 140 mmol/L (ref 135–145)

## 2017-12-10 LAB — CBC
HEMATOCRIT: 23.8 % — AB (ref 35.0–47.0)
HEMOGLOBIN: 7.5 g/dL — AB (ref 12.0–16.0)
MCH: 26.9 pg (ref 26.0–34.0)
MCHC: 31.6 g/dL — ABNORMAL LOW (ref 32.0–36.0)
MCV: 85 fL (ref 80.0–100.0)
Platelets: 314 10*3/uL (ref 150–440)
RBC: 2.8 MIL/uL — ABNORMAL LOW (ref 3.80–5.20)
RDW: 18.1 % — ABNORMAL HIGH (ref 11.5–14.5)
WBC: 9.4 10*3/uL (ref 3.6–11.0)

## 2017-12-10 LAB — MAGNESIUM: Magnesium: 1.9 mg/dL (ref 1.7–2.4)

## 2017-12-10 LAB — PHOSPHORUS: PHOSPHORUS: 4.8 mg/dL — AB (ref 2.5–4.6)

## 2017-12-10 LAB — GLUCOSE, CAPILLARY
GLUCOSE-CAPILLARY: 102 mg/dL — AB (ref 70–99)
GLUCOSE-CAPILLARY: 120 mg/dL — AB (ref 70–99)
GLUCOSE-CAPILLARY: 95 mg/dL (ref 70–99)

## 2017-12-10 NOTE — Progress Notes (Addendum)
Tube Feeding Schedule for [employer]  Tube Feeding Formula: Jevity 1.5 Cal?    Provide Jevity 1.5 Cal at 35 ml/hr continuously over 24 hrs via feeding pump (840 ml goal volume daily; approximately 3.5 cans of Jevity 1.5 Cal)  ?  Do not lie flat during feeding. Remain upright at 30-45 degree angle.??    Flush feeding tube with 50 mL water every 8 hours (3 times per day). Flush with 30 mL of water before and after medication administration. Water flushes should be at room temperature.?  ?  Please wash hands before and after each feeding is administered.?   Opened bottles of unused feeding can be covered and placed in refrigerator for use at next feeding (up to 24 hours). Bring to room temperature before use.  ?  **If not tolerating Jevity 1.5 Cal, recommend speaking with RD/MD regarding changing formula to something you can tolerate better?

## 2017-12-10 NOTE — Care Management (Addendum)
Daughter Michelle [name] has informed RNCM that Michelle [name] RT with [facility] will visit her today at Michelle [name] for vent training at 1230P. Michelle [name] with Michelle [name] will meet with RNCM and Michelle [name] today at 2P to establish a date of transition to home for Michelle [name]. RNCM will provide hours worked from both [name] to [place] during our meeting as Michelle [name] could not read the ones RNCM faxed yesterday.

## 2017-12-10 NOTE — Progress Notes (Signed)
Nutrition Follow-up  DOCUMENTATION CODES:   Non-severe (moderate) malnutrition in context of chronic illness  INTERVENTION:  Continue Jevity 1.5 Cal at 35 mL/hr (840 mL goal daily volume) + Pro-Stat 30 mL BID per G-tube. Provides 1460 kcal, 84 grams of protein, 18.5 grams of fiber, 638 mL H2O daily.  Continue liquid MVI daily per tube.  With free water flush 50 mL Q8hrs patient is receiving a total of 788 mL H2O including water in tube feeding. She will also receive approximately 180 mL fluid with Pro-Stat provision.  NUTRITION DIAGNOSIS:   Moderate Malnutrition related to chronic illness(COPD, CHF, hepatitis C, cocaine abuse) as evidenced by mild fat depletion, moderate fat depletion, mild muscle depletion, moderate muscle depletion.  Ongoing - addressing with TF regimen.  GOAL:   Provide needs based on ASPEN/SCCM guidelines  Met with Tf regimen.  MONITOR:   Vent status, Labs, Weight trends, TF tolerance, I & O's  REASON FOR ASSESSMENT:   Ventilator    ASSESSMENT:   57 year old female with PMHx of emphysema, COPD, HTN, hypercholesterolemia, hx pneumothorax s/p chest tube insertion, CAD s/p cardiac catheterization, cocaine abuse, tobacco abuse, asthma, CHF, anxiety, hepatitis C admitted with acute exacerbation of COPD, acute on chronic respiratory failure requiring intubation 4/29, PNA, cocaine intoxication, AKI due to renal hypoperfusion.   -Patient is s/p tracheostomy tube placement on 5/16. OGT was replaced with NGT. -Patient s/p placement of 20 Fr. Pull-through gastrostomy tube on 5/30 by IR. -Per [place] RN documentationon 6/12the device related pressure injury at trach has now healed as no open wounds were found. -Plan is for vent training at bedside today and to establish a date of transition to home.  Patient remains ventilated through tracheostomy collar. On PRVC mode with FiO2 30%. Abdomen feels soft. Patient has moderate pitting edema in upper extremities, edema  in lower extremities have improved.  Access: 20 Fr. G-tube placed on 5/30 by IR  TF: pt tolerating Jevity 1.5 Cal at 35 mL/hr + Pro-Stat 30 mL BID; per pump history patient received 788 mL tube feeds in the past 24 hrs (93.8% goal daily volume)  Patient is currently intubated on ventilator support Ve: 9 L/min Temp (24hrs), Avg:98.9 F (37.2 C), Min:98.6 F (37 C), Max:99.4 F (37.4 C)  Propofol: N/A  Medications reviewed and include: ciprofloxacin, clonazepam, famotidine, fentanyl patch, free water flush 50 mL Q8hrs, liquid MVI daily per tube, potassium chloride 20 mEq BID per tube, Seroquel, Florastor 250 mg BID, valproic acid.  Labs reviewed: CBG 90-120, CO2 33, Creatinine 0.36, Anion gap 4, Phosphorus 4.8.  I/O: 600 mL UOP yesterday (0.4 mL/kg/hr)  Weight trend: 56.8 kg on 7/2; wt trending down now; +11.1 kg from admission wt  Discussed with RN and on rounds.  Diet Order:   Diet Order    None      EDUCATION NEEDS:   No education needs have been identified at this time  Skin:  Skin Assessment: Skin Integrity Issues: Skin Integrity Issues:: Stage II Stage II: medial sacrum (epithelizlized/intact) Incisions: N/A Other: MSAD to abdomen and groin  Last BM:  12/10/2017 - large type 5  Height:   Ht Readings from Last 1 Encounters:  10/01/17 _0  (1.448 m)    Weight:   Wt Readings from Last 1 Encounters:  12/03/17 125 lb 3.5 oz (56.8 kg)    Ideal Body Weight:  43.2 kg  BMI:  Body mass index is 27.1 kg/m.  Estimated Nutritional Needs:   Kcal:  1150-1370 (25-30 kcal/kg)  Protein:  70-93 grams (1.5-2 grams/kg)  Fluid:  1.4-1.6 L/day (30-35 mL/kg)  Willey Blade, MS, RD, LDN Office: [phone] Pager: [phone] After Hours/Weekend Pager: [phone]

## 2017-12-10 NOTE — Progress Notes (Signed)
CRITICAL CARE NOTE  CC  follow up respiratory failure  SUBJECTIVE Remains on vent Hypoxia after bath yesterday +pneumonia on IV abx Failure to wean from vent S/p trach and peg     SIGNIFICANT EVENTS    BP 112/69   Pulse 88   Temp 98.6 F (37 C)   Resp 16   Ht 4\' 9"  (1.448 m)   Wt 125 lb 3.5 oz (56.8 kg)   LMP 03/06/2005 (Approximate)   SpO2 96%   BMI 27.10 kg/m    REVIEW OF SYSTEMS Limited due to vent   PHYSICAL EXAMINATION:  GENERAL:critically ill appearing s/p trcah HEAD: Normocephalic, atraumatic.  EYES: Pupils equal, round, reactive to light.  No scleral icterus.  MOUTH: Moist mucosal membrane. NECK: Supple. No thyromegaly. No nodules. No JVD.  PULMONARY: +rhonchi CARDIOVASCULAR: S1 and S2. Regular rate and rhythm. No murmurs, rubs, or gallops.  GASTROINTESTINAL: Soft, nontender, -distended. No masses. Positive bowel sounds. No hepatosplenomegaly. S/p peg MUSCULOSKELETAL: No swelling, clubbing, or edema.  NEUROLOGIC: alert and awake SKIN:intact,warm,dry  ASSESSMENT AND PLAN    Severe Hypoxic and Hypercapnic Respiratory Failure -failure to wean from vent  Severe END stage COPD BD therapy Vent support Oxygen support  Staph and pseudomonas pneumonia IV abx for 14 days   ID -continue IV abx as prescibed -follow up cultures   DVT/GI PRX ordered TRANSFUSIONS AS NEEDED MONITOR FSBS ASSESS the need for LABS as needed  LTAC versus HOME WITH VENT  Corrin Parker, M.D.  [facility]  Medical Director [place] Director [place] Department

## 2017-12-10 NOTE — Progress Notes (Signed)
Patient ID: Michelle [name], female   DOB: [birth year], 57 y.o.   MRN: [id number]   [employer] PROGRESS NOTE  CLEMENCE LENGYEL LEX:[id number] DOB: 06/03/1961 DOA: 09/30/2017 PCP: [facility] [place]  HPI/Subjective: Patient was able to talk over the vent today.  Daughter was at the bedside.  Difficult to understand her speaking.  Was able to reposition the vent tubing which seemed to feel more comfortable about the patient.  I spoke with respiratory about checking the cuff inflation level.  Objective: Vitals:   12/10/17 1100 12/10/17 1135  BP: 127/79   Pulse: 88   Resp: 17   Temp:    SpO2: 96% 97%    Filed Weights   12/01/17 0400 12/02/17 0247 12/03/17 0400  Weight: 58.1 kg (128 lb 1.4 oz) 58.1 kg (128 lb 1.4 oz) 56.8 kg (125 lb 3.5 oz)    ROS: Review of Systems  Unable to perform ROS: Acuity of condition  Respiratory: Negative for shortness of breath.   Cardiovascular: Negative for chest pain.  Gastrointestinal: Negative for abdominal pain.   Exam: Physical Exam  HENT:  Nose: No mucosal edema.  Eyes: Pupils are equal, round, and reactive to light. Conjunctivae and lids are normal.  Neck: No JVD present. Carotid bruit is not present. No edema present. No thyroid mass and no thyromegaly present.  Cardiovascular: S1 normal and S2 normal. Exam reveals no gallop.  No murmur heard. Pulses:      Dorsalis pedis pulses are 2+ on the right side, and 2+ on the left side.  Respiratory: No respiratory distress. She has decreased breath sounds in the right lower field and the left lower field. She has no wheezes. She has rhonchi in the right lower field and the left lower field. She has no rales.  GI: Soft. Bowel sounds are normal. There is no tenderness.  Musculoskeletal:       Right ankle: She exhibits no swelling.       Left ankle: She exhibits no swelling.  Lymphadenopathy:    She has no cervical adenopathy.  Neurological: She is alert.  Unable to lift up her legs up  off the bed.  Able to lift forearms up off the bed and squeeze my hands.  Able to wiggle toes.  Skin: Skin is warm. No rash noted. Nails show no clubbing.  Psychiatric: She has a normal mood and affect.      Data Reviewed: Basic Metabolic Panel: Recent Labs  Lab 12/07/17 0545 12/10/17 0450  NA 141 140  K 4.5 4.0  CL 101 103  CO2 32 33*  GLUCOSE 99 113*  BUN 21* 19  CREATININE <0.30* 0.36*  CALCIUM 8.6* 8.4*  MG  --  1.9  PHOS  --  4.8*   Liver Function Tests: Recent Labs  Lab 12/07/17 0545  AST 18  ALT 8  ALKPHOS 44  BILITOT 0.1*  PROT 6.6  ALBUMIN 2.0*    Critical care team to teach the family how to do the event.  CBC: Recent Labs  Lab 12/05/17 0416 12/06/17 0537 12/07/17 0545 12/10/17 0450  WBC 7.7 6.3 7.4 9.4  NEUTROABS 4.2  --   --   --   HGB 8.5* 7.5* 8.2* 7.5*  HCT 27.2* 24.3* 25.8* 23.8*  MCV 86.3 87.2 85.9 85.0  PLT 252 263 284 314   BNP (last 3 results) Recent Labs    06/29/17 0412 08/20/17 1337 09/30/17 1705  BNP 38.0 33.0 66.0    CBG: Recent  Labs  Lab 12/08/17 2353 12/09/17 0714 12/09/17 1642 12/09/17 2343 12/10/17 0756  GLUCAP 92 105* 90 94 120*    Recent Results (from the past 240 hour(s))  Urine Culture     Status: Abnormal   Collection Time: 11/30/17  9:31 PM  Result Value Ref Range Status   Specimen Description   Final    URINE, RANDOM Performed at [facility], [address] [place], [place] [address]    Special Requests   Final    NONE Performed at [facility], [address] [place], [place] [address]    Culture (A)  Final    <10,000 COLONIES/mL INSIGNIFICANT GROWTH Performed at [facility], [place] [address] [place], [place] [address]    Report Status 12/02/2017 FINAL  Final  Culture, respiratory (NON-Expectorated)     Status: None   Collection Time: 12/04/17 10:38 AM  Result Value Ref Range Status   Specimen Description   Final    TRACHEAL ASPIRATE Performed at [facility], [address], [place], [place] [address]    Special Requests   Final    NONE Performed at [facility], [place] [place], [place] [address]    Gram Stain   Final    MODERATE WBC PRESENT, PREDOMINANTLY PMN FEW GRAM POSITIVE COCCI RARE GRAM NEGATIVE RODS Performed at [facility], [place] [address] [place], [place] [address]    Culture ABUNDANT STAPHYLOCOCCUS AUREUS  Final   Report Status 12/06/2017 FINAL  Final   Organism ID, Bacteria STAPHYLOCOCCUS AUREUS  Final      Susceptibility   Staphylococcus aureus - MIC*    CIPROFLOXACIN <=0.5 SENSITIVE Sensitive     ERYTHROMYCIN <=0.25 SENSITIVE Sensitive     GENTAMICIN <=0.5 SENSITIVE Sensitive     OXACILLIN <=0.25 SENSITIVE Sensitive     TETRACYCLINE <=1 SENSITIVE Sensitive     VANCOMYCIN <=0.5 SENSITIVE Sensitive     TRIMETH/SULFA <=10 SENSITIVE Sensitive     CLINDAMYCIN <=0.25 SENSITIVE Sensitive     RIFAMPIN <=0.5 SENSITIVE Sensitive     Inducible Clindamycin NEGATIVE Sensitive     * ABUNDANT STAPHYLOCOCCUS AUREUS      Scheduled Meds: . budesonide (PULMICORT) nebulizer solution  0.5 mg Nebulization BID  . chlorhexidine gluconate (MEDLINE KIT)  15 mL Mouth Rinse BID  . ciprofloxacin  500 mg Per Tube BID  . clonazePAM  0.5 mg Oral Daily  . clonazePAM  1 mg Per Tube QHS  . enoxaparin (LOVENOX) injection  40 mg Subcutaneous Q24H  . famotidine  20 mg Per Tube Daily  . feeding supplement (PRO-STAT SUGAR FREE 64)  30 mL Per Tube BID  . fentaNYL  50 mcg Transdermal Q72H  . free water  50 mL Per Tube Q8H  . ipratropium-albuterol  3 mL Nebulization Q6H  . mouth rinse  15 mL Mouth Rinse 10 times per day  . multivitamin  15 mL Per Tube Daily  . nystatin  5 mL Oral QID  . nystatin cream   Topical BID  . PARoxetine  20 mg Per Tube BID  . potassium chloride  20 mEq Per Tube BID  . QUEtiapine  25 mg Per Tube QHS  . saccharomyces boulardii  250 mg Oral BID  . senna-docusate  1 tablet Per Tube BID   . sodium chloride flush  10-40 mL Intracatheter Q12H  . valproic acid  500 mg Per Tube BID   Continuous Infusions: . sodium chloride 5 mL/hr at  12/10/17 1100  . feeding supplement (JEVITY 1.5 CAL/FIBER) 1,000 mL (12/09/17 0610)    Assessment/Plan:  1. Ventilator associated pneumonia.  Staph aureus and a gram-negative rod growing out of cultures.  Patient switched to Cipro. 2. Acute on chronic hypoxic respiratory failure status post tracheostomy on ventilator support.  Unable to wean from ventilator.  FiO2 at 30%.  Critical care team to speak with the family today about the ventilator. 3. Critical illness polyneuropathy.  Patient today able to squeeze my hands and wiggle toes. 4. Severe anxiety on Xanax Klonopin and Depakote 5. COPD on nebulizer treatments 6. Stage II decubitus ulcer on sacrum.  Local wound care 7. Anemia of chronic disease.  Hemoglobin 8.2. 8. Nutrition.  Continue tube feeding as per dietary.   Code Status:     Code Status Orders  (From admission, onward)        Start     Ordered   10/28/17 1707  Limited resuscitation (code)  Continuous    Question Answer Comment  In the event of cardiac or respiratory ARREST: Initiate Code Blue, Call Rapid Response No   In the event of cardiac or respiratory ARREST: Perform CPR No   In the event of cardiac or respiratory ARREST: Perform Intubation/Mechanical Ventilation Yes   In the event of cardiac or respiratory ARREST: Use NIPPV/BiPAp only if indicated No   In the event of cardiac or respiratory ARREST: Administer ACLS medications if indicated No   In the event of cardiac or respiratory ARREST: Perform Defibrillation or Cardioversion if indicated No   Comments Continue trach and ventilator. No resusitation if heart stops.      10/28/17 1711    Code Status History    Date Active Date Inactive Code Status Order ID Comments User Context   09/30/2017 1945 10/28/2017 1711 Full Code [id number]  [name], MD  Inpatient   08/20/2017 1713 08/24/2017 1743 Full Code [id number]  Dustin Flock, MD Inpatient   06/29/2017 0625 07/02/2017 2008 Full Code [id number]  Harrie Foreman, MD Inpatient   02/23/2017 0241 02/26/2017 1634 Full Code [id number]  Harrie Foreman, MD Inpatient   01/01/2017 1144 01/04/2017 1354 Full Code [id number]  Bettey Costa, MD Inpatient   05/22/2016 2155 05/27/2016 1645 Full Code [id number]  Fritzi Mandes, MD Inpatient   04/23/2016 2020 04/27/2016 1848 Full Code [id number]  Hower, Aaron Mose, MD ED   02/07/2016 1922 02/10/2016 1835 Full Code [id number]  Gladstone Lighter, MD Inpatient   11/29/2015 0519 12/01/2015 1609 Full Code [id number]  Harrie Foreman, MD Inpatient   11/05/2015 0911 11/07/2015 1606 Full Code [id number]  Hillary Bow, MD ED   10/24/2014 1522 10/27/2014 1630 Full Code [id number]  Hillary Bow, MD Inpatient     Family Communication:  daughter at the bedside Disposition Plan: On Wednesday the patient will be either discharged home with palliative care and home health or to [facility] facility.  Antibiotics:   Cipro  Time spent: 25 minutes.  Case discussed with daughter at the bedside today.  Patient is a high risk for repeated infections and high risk for cardiopulmonary arrest.  Loletha Grayer  [employer]

## 2017-12-10 NOTE — Care Management (Signed)
Trilogy/vent Rx completed for [facility] which was faxed to Mick Sell with [facility] [phone].  Home PT cannot be mixed with private duty services per [facility].  Home RN can do turns and ROM exercises with patient and family has been notified of that.  RNCM team may provide PT recommendation to home RN at time of discharge- exercise program per say. Orders requested for hospital bed, Tube feeding, Trach dressing/suction DME from MD.

## 2017-12-10 NOTE — Care Management (Signed)
Meeting took place between [facility], [facility] care [place] and Trey Paula, patient's daughter [name], patient, [name]'s father Pieter Partridge, and Waunita Schooner RT with [facility].  Eduard Clos will try to have a schedule aligned to care for patient in the home by Thursday July 25- not confirmed.  Bedside staff to train family on trach care and dressing changes in the mean time.

## 2017-12-11 LAB — CBC WITH DIFFERENTIAL/PLATELET
Band Neutrophils: 2 %
Basophils Absolute: 0 10*3/uL (ref 0–0.1)
Basophils Relative: 0 %
Blasts: 0 %
EOS PCT: 0 %
Eosinophils Absolute: 0 10*3/uL (ref 0–0.7)
HEMATOCRIT: 25.9 % — AB (ref 35.0–47.0)
Hemoglobin: 8.5 g/dL — ABNORMAL LOW (ref 12.0–16.0)
LYMPHS ABS: 2.5 10*3/uL (ref 1.0–3.6)
LYMPHS PCT: 23 %
MCH: 27.8 pg (ref 26.0–34.0)
MCHC: 33 g/dL (ref 32.0–36.0)
MCV: 84.1 fL (ref 80.0–100.0)
MONOS PCT: 10 %
Metamyelocytes Relative: 1 %
Monocytes Absolute: 1.1 10*3/uL — ABNORMAL HIGH (ref 0.2–0.9)
Myelocytes: 0 %
NEUTROS ABS: 7.3 10*3/uL — AB (ref 1.4–6.5)
NRBC: 0 /100{WBCs}
Neutrophils Relative %: 64 %
OTHER: 0 %
Platelets: 360 10*3/uL (ref 150–440)
Promyelocytes Relative: 0 %
RBC: 3.08 MIL/uL — AB (ref 3.80–5.20)
RDW: 18.7 % — AB (ref 11.5–14.5)
WBC: 10.9 10*3/uL (ref 3.6–11.0)

## 2017-12-11 LAB — GLUCOSE, CAPILLARY
Glucose-Capillary: 101 mg/dL — ABNORMAL HIGH (ref 70–99)
Glucose-Capillary: 98 mg/dL (ref 70–99)

## 2017-12-11 MED ORDER — CHLORHEXIDINE GLUCONATE 0.12 % MT SOLN
OROMUCOSAL | Status: AC
  Administered 2017-12-11: 15 mL via OROMUCOSAL
  Filled 2017-12-11: qty 15

## 2017-12-11 MED ORDER — FENTANYL 25 MCG/HR TD PT72
25.0000 ug | MEDICATED_PATCH | TRANSDERMAL | Status: DC
  Administered 2017-12-13 – 2017-12-31 (×7): 25 ug via TRANSDERMAL
  Filled 2017-12-11 (×7): qty 1

## 2017-12-11 NOTE — Progress Notes (Signed)
CRITICAL CARE NOTE  CC  follow up respiratory failure  SUBJECTIVE Remains on vent Failure to wean from vent S/p trach and peg     SIGNIFICANT EVENTS No acute events overnight   BP (!) 155/102   Pulse 90   Temp 98.9 F (37.2 C) (Oral)   Resp 17   Ht 4\' 9"  (1.448 m)   Wt 121 lb 0.5 oz (54.9 kg)   LMP 03/06/2005 (Approximate)   SpO2 99%   BMI 26.19 kg/m    REVIEW OF SYSTEMS Limited due to vent   PHYSICAL EXAMINATION:  GENERAL:critically ill appearing s/p trcah HEAD: Normocephalic, atraumatic.  EYES: Pupils equal, round, reactive to light.  No scleral icterus.  MOUTH: Moist mucosal membrane. NECK: Supple. No thyromegaly. No nodules. No JVD.  PULMONARY: +rhonchi CARDIOVASCULAR: S1 and S2. Regular rate and rhythm. No murmurs, rubs, or gallops.  GASTROINTESTINAL: Soft, nontender, -distended. No masses. Positive bowel sounds. No hepatosplenomegaly. S/p peg MUSCULOSKELETAL: No swelling, clubbing, or edema.  NEUROLOGIC: alert and awake SKIN:intact,warm,dry  ASSESSMENT AND PLAN    Severe Hypoxic and Hypercapnic Respiratory Failure -failure to wean from vent Plan for ps mode  Severe END stage COPD BD therapy Vent support Oxygen support  Staph and pseudomonas pneumonia IV abx for 14 days   ID -continue IV abx as prescibed -follow up cultures   DVT/GI PRX ordered TRANSFUSIONS AS NEEDED MONITOR FSBS ASSESS the need for LABS as needed  LTAC versus HOME WITH VENT  Corrin Parker, M.D.  [facility]  Medical Director [place] Director [place] Department

## 2017-12-11 NOTE — Progress Notes (Signed)
Patient ID: Michelle [name], female   DOB: [birth year], 57 y.o.   MRN: [id number]   [employer] PROGRESS NOTE  VIVIA ROSENBURG WFU:[id number] DOB: 10-Sep-1960 DOA: 09/30/2017 PCP: [facility] [place]  HPI/Subjective: Patient able to shake her head to a few questions.  Able to raise arms off off the bed.  Able to wiggle toes.  Objective: Vitals:   12/11/17 1100 12/11/17 1159  BP: 125/76   Pulse: 88 85  Resp: 17 17  Temp:  98.6 F (37 C)  SpO2: 99% 99%    Filed Weights   12/02/17 0247 12/03/17 0400 12/11/17 0436  Weight: 58.1 kg (128 lb 1.4 oz) 56.8 kg (125 lb 3.5 oz) 54.9 kg (121 lb 0.5 oz)    ROS: Review of Systems  Unable to perform ROS: Acuity of condition  Respiratory: Negative for shortness of breath.   Cardiovascular: Negative for chest pain.  Gastrointestinal: Negative for abdominal pain.   Exam: Physical Exam  HENT:  Nose: No mucosal edema.  Eyes: Pupils are equal, round, and reactive to light. Conjunctivae and lids are normal.  Neck: No JVD present. Carotid bruit is not present. No edema present. No thyroid mass and no thyromegaly present.  Cardiovascular: S1 normal and S2 normal. Exam reveals no gallop.  No murmur heard. Pulses:      Dorsalis pedis pulses are 2+ on the right side, and 2+ on the left side.  Respiratory: No respiratory distress. She has decreased breath sounds in the right lower field and the left lower field. She has no wheezes. She has rhonchi in the right lower field and the left lower field. She has no rales.  GI: Soft. Bowel sounds are normal. There is no tenderness.  Musculoskeletal:       Right ankle: She exhibits no swelling.       Left ankle: She exhibits no swelling.  Lymphadenopathy:    She has no cervical adenopathy.  Neurological: She is alert.  Unable to lift up her legs up off the bed.  Able to lift forearms up off the bed and squeeze my hands.  Able to wiggle toes.  Skin: Skin is warm. No rash noted. Nails show no  clubbing.  Psychiatric: She has a normal mood and affect.      Data Reviewed: Basic Metabolic Panel: Recent Labs  Lab 12/07/17 0545 12/10/17 0450  NA 141 140  K 4.5 4.0  CL 101 103  CO2 32 33*  GLUCOSE 99 113*  BUN 21* 19  CREATININE <0.30* 0.36*  CALCIUM 8.6* 8.4*  MG  --  1.9  PHOS  --  4.8*   Liver Function Tests: Recent Labs  Lab 12/07/17 0545  AST 18  ALT 8  ALKPHOS 44  BILITOT 0.1*  PROT 6.6  ALBUMIN 2.0*    Critical care team to teach the family how to do the event.  CBC: Recent Labs  Lab 12/05/17 0416 12/06/17 0537 12/07/17 0545 12/10/17 0450 12/11/17 0429  WBC 7.7 6.3 7.4 9.4 10.9  NEUTROABS 4.2  --   --   --  7.3*  HGB 8.5* 7.5* 8.2* 7.5* 8.5*  HCT 27.2* 24.3* 25.8* 23.8* 25.9*  MCV 86.3 87.2 85.9 85.0 84.1  PLT 252 263 284 314 360   BNP (last 3 results) Recent Labs    06/29/17 0412 08/20/17 1337 09/30/17 1705  BNP 38.0 33.0 66.0    CBG: Recent Labs  Lab 12/09/17 2343 12/10/17 0756 12/10/17 1625 12/10/17 2338 12/11/17 2025  GLUCAP 94 120* 102* 95 101*    Recent Results (from the past 240 hour(s))  Culture, respiratory (NON-Expectorated)     Status: None   Collection Time: 12/04/17 10:38 AM  Result Value Ref Range Status   Specimen Description   Final    TRACHEAL ASPIRATE Performed at [facility], [address], [place], [place] [address]    Special Requests   Final    NONE Performed at [facility], [address] [place], [place] [address]    Gram Stain   Final    MODERATE WBC PRESENT, PREDOMINANTLY PMN FEW GRAM POSITIVE COCCI RARE GRAM NEGATIVE RODS Performed at [facility], [place] [address], [place], [place] [address]    Culture ABUNDANT STAPHYLOCOCCUS AUREUS  Final   Report Status 12/06/2017 FINAL  Final   Organism ID, Bacteria STAPHYLOCOCCUS AUREUS  Final      Susceptibility   Staphylococcus aureus - MIC*    CIPROFLOXACIN <=0.5 SENSITIVE Sensitive     ERYTHROMYCIN <=0.25 SENSITIVE  Sensitive     GENTAMICIN <=0.5 SENSITIVE Sensitive     OXACILLIN <=0.25 SENSITIVE Sensitive     TETRACYCLINE <=1 SENSITIVE Sensitive     VANCOMYCIN <=0.5 SENSITIVE Sensitive     TRIMETH/SULFA <=10 SENSITIVE Sensitive     CLINDAMYCIN <=0.25 SENSITIVE Sensitive     RIFAMPIN <=0.5 SENSITIVE Sensitive     Inducible Clindamycin NEGATIVE Sensitive     * ABUNDANT STAPHYLOCOCCUS AUREUS      Scheduled Meds: . budesonide (PULMICORT) nebulizer solution  0.5 mg Nebulization BID  . chlorhexidine gluconate (MEDLINE KIT)  15 mL Mouth Rinse BID  . ciprofloxacin  500 mg Per Tube BID  . clonazePAM  0.5 mg Oral Daily  . clonazePAM  1 mg Per Tube QHS  . enoxaparin (LOVENOX) injection  40 mg Subcutaneous Q24H  . famotidine  20 mg Per Tube Daily  . feeding supplement (PRO-STAT SUGAR FREE 64)  30 mL Per Tube BID  . [START ON 12/13/2017] fentaNYL  25 mcg Transdermal Q72H  . free water  50 mL Per Tube Q8H  . ipratropium-albuterol  3 mL Nebulization Q6H  . mouth rinse  15 mL Mouth Rinse 10 times per day  . multivitamin  15 mL Per Tube Daily  . nystatin  5 mL Oral QID  . nystatin cream   Topical BID  . PARoxetine  20 mg Per Tube BID  . potassium chloride  20 mEq Per Tube BID  . QUEtiapine  25 mg Per Tube QHS  . saccharomyces boulardii  250 mg Oral BID  . senna-docusate  1 tablet Per Tube BID  . sodium chloride flush  10-40 mL Intracatheter Q12H  . valproic acid  500 mg Per Tube BID   Continuous Infusions: . sodium chloride 5 mL/hr at 12/10/17 2100  . feeding supplement (JEVITY 1.5 CAL/FIBER) 1,000 mL (12/10/17 1434)    Assessment/Plan:  1. Ventilator associated pneumonia.  Staph aureus and a gram-negative rod growing out of cultures.  Patient switched to Cipro. 2. Acute on chronic hypoxic respiratory failure status post tracheostomy on ventilator support.  Unable to wean from ventilator.  FiO2 at 30%. 3. Critical illness polyneuropathy.  Patient still very weak. 4. Severe anxiety on Xanax Klonopin  and Depakote 5. COPD on nebulizer treatments 6. Stage II decubitus ulcer on sacrum.  Local wound care 7. Anemia of chronic disease.  Hemoglobin 8.2. 8. Nutrition.  Continue tube feeding as per dietary.   Code Status:  Code Status Orders  (From admission, onward)        Start     Ordered   10/28/17 1707  Limited resuscitation (code)  Continuous    Question Answer Comment  In the event of cardiac or respiratory ARREST: Initiate Code Blue, Call Rapid Response No   In the event of cardiac or respiratory ARREST: Perform CPR No   In the event of cardiac or respiratory ARREST: Perform Intubation/Mechanical Ventilation Yes   In the event of cardiac or respiratory ARREST: Use NIPPV/BiPAp only if indicated No   In the event of cardiac or respiratory ARREST: Administer ACLS medications if indicated No   In the event of cardiac or respiratory ARREST: Perform Defibrillation or Cardioversion if indicated No   Comments Continue trach and ventilator. No resusitation if heart stops.      10/28/17 1711    Code Status History    Date Active Date Inactive Code Status Order ID Comments User Context   09/30/2017 1945 10/28/2017 1711 Full Code [id number]  [name], MD Inpatient   08/20/2017 1713 08/24/2017 1743 Full Code [id number]  Dustin Flock, MD Inpatient   06/29/2017 0625 07/02/2017 2008 Full Code [id number]  Harrie Foreman, MD Inpatient   02/23/2017 0241 02/26/2017 1634 Full Code [id number]  Harrie Foreman, MD Inpatient   01/01/2017 1144 01/04/2017 1354 Full Code [id number]  Bettey Costa, MD Inpatient   05/22/2016 2155 05/27/2016 1645 Full Code [id number]  Fritzi Mandes, MD Inpatient   04/23/2016 2020 04/27/2016 1848 Full Code [id number]  Hower, Aaron Mose, MD ED   02/07/2016 1922 02/10/2016 1835 Full Code [id number]  Gladstone Lighter, MD Inpatient   11/29/2015 0519 12/01/2015 1609 Full Code [id number]  Harrie Foreman, MD Inpatient   11/05/2015 0911 11/07/2015 1606 Full Code [id number]  Hillary Bow, MD ED   10/24/2014 1522 10/27/2014 1630 Full Code [id number]  Hillary Bow, MD Inpatient     Family Communication:  daughter yesterday Disposition Plan: The patient was supposed to be discharged today.  I do not see an order yet.  Since patient in the critical care unit critical care team will handle discharge.  Antibiotics:   Cipro  Time spent: 24 minutes  [place]

## 2017-12-11 NOTE — Care Management (Signed)
Vent setting/Supplemental O2 and feeds faxed to Trey Paula with [name] health care [phone].

## 2017-12-12 LAB — GLUCOSE, CAPILLARY
GLUCOSE-CAPILLARY: 103 mg/dL — AB (ref 70–99)
GLUCOSE-CAPILLARY: 93 mg/dL (ref 70–99)
Glucose-Capillary: 109 mg/dL — ABNORMAL HIGH (ref 70–99)
Glucose-Capillary: 90 mg/dL (ref 70–99)

## 2017-12-12 MED ORDER — MORPHINE SULFATE (PF) 2 MG/ML IV SOLN
2.0000 mg | INTRAVENOUS | Status: AC
  Administered 2017-12-12: 2 mg via INTRAVENOUS
  Filled 2017-12-12: qty 1

## 2017-12-12 NOTE — Progress Notes (Signed)
Placed on high flow trach collar with 40% Fio2 now

## 2017-12-12 NOTE — Progress Notes (Signed)
Patient ID: Michelle [name], female     [employer] PROGRESS NOTE  Michelle [name] UUV:[id number] DOB: 01-11-1961 DOA: 09/30/2017 PCP: [facility]  HPI/Subjective: Patient having more respiratory distress.  Seen while she was on the high flow trach collar.  Objective: Vitals:   12/12/17 1235 12/12/17 1314  BP:    Pulse: 92   Resp: 19   Temp: 98.7 F (37.1 C)   SpO2: 95% 95%    Filed Weights   12/02/17 0247 12/03/17 0400 12/11/17 0436  Weight: 58.1 kg (128 lb 1.4 oz) 56.8 kg (125 lb 3.5 oz) 54.9 kg (121 lb 0.5 oz)    ROS: Review of Systems  Unable to perform ROS: Acuity of condition  Respiratory: Negative for shortness of breath.   Cardiovascular: Negative for chest pain.  Gastrointestinal: Negative for abdominal pain.   Exam: Physical Exam  HENT:  Nose: No mucosal edema.  Eyes: Pupils are equal, round, and reactive to light. Conjunctivae and lids are normal.  Neck: No JVD present. Carotid bruit is not present. No edema present. No thyroid mass and no thyromegaly present.  Cardiovascular: S1 normal and S2 normal. Exam reveals no gallop.  No murmur heard. Pulses:      Dorsalis pedis pulses are 2+ on the right side, and 2+ on the left side.  Respiratory: Accessory muscle usage present. She is in respiratory distress. She has decreased breath sounds in the right middle field, the right lower field, the left middle field and the left lower field. She has no wheezes. She has rhonchi in the right middle field, the right lower field, the left middle field and the left lower field. She has no rales.  GI: Soft. Bowel sounds are normal. There is no tenderness.  Musculoskeletal:       Right ankle: She exhibits no swelling.       Left ankle: She exhibits no swelling.  Lymphadenopathy:    She has no cervical adenopathy.  Neurological: She is alert.  Unable to lift up her legs up off the bed.  Able to lift forearms up off the bed and squeeze my hands.  Able to  wiggle toes.  Skin: Skin is warm. No rash noted. Nails show no clubbing.  Psychiatric: She has a normal mood and affect.      Data Reviewed: Basic Metabolic Panel: Recent Labs  Lab 12/07/17 0545 12/10/17 0450  NA 141 140  K 4.5 4.0  CL 101 103  CO2 32 33*  GLUCOSE 99 113*  BUN 21* 19  CREATININE <0.30* 0.36*  CALCIUM 8.6* 8.4*  MG  --  1.9  PHOS  --  4.8*   Liver Function Tests: Recent Labs  Lab 12/07/17 0545  AST 18  ALT 8  ALKPHOS 44  BILITOT 0.1*  PROT 6.6  ALBUMIN 2.0*    Critical care team to teach the family how to do the event.  CBC: Recent Labs  Lab 12/06/17 0537 12/07/17 0545 12/10/17 0450 12/11/17 0429  WBC 6.3 7.4 9.4 10.9  NEUTROABS  --   --   --  7.3*  HGB 7.5* 8.2* 7.5* 8.5*  HCT 24.3* 25.8* 23.8* 25.9*  MCV 87.2 85.9 85.0 84.1  PLT 263 284 314 360   BNP (last 3 results) Recent Labs    06/29/17 0412 08/20/17 1337 09/30/17 1705  BNP 38.0 33.0 66.0    CBG: Recent Labs  Lab 12/10/17 2338 12/11/17 0739 12/11/17 1602 12/12/17 0021 12/12/17 0810  GLUCAP 95  101* 98 90 103*    Recent Results (from the past 240 hour(s))  Culture, respiratory (NON-Expectorated)     Status: None   Collection Time: 12/04/17 10:38 AM  Result Value Ref Range Status   Specimen Description   Final    TRACHEAL ASPIRATE Performed at [facility], [address] [place], [place] [address]    Special Requests   Final    NONE Performed at [facility], [address] [place], [place] [address]    Gram Stain   Final    MODERATE WBC PRESENT, PREDOMINANTLY PMN FEW GRAM POSITIVE COCCI RARE GRAM NEGATIVE RODS Performed at [facility], [place] [place], [place] [address]    Culture ABUNDANT STAPHYLOCOCCUS AUREUS  Final   Report Status 12/06/2017 FINAL  Final   Organism ID, Bacteria STAPHYLOCOCCUS AUREUS  Final      Susceptibility   Staphylococcus aureus - MIC*    CIPROFLOXACIN <=0.5 SENSITIVE Sensitive      ERYTHROMYCIN <=0.25 SENSITIVE Sensitive     GENTAMICIN <=0.5 SENSITIVE Sensitive     OXACILLIN <=0.25 SENSITIVE Sensitive     TETRACYCLINE <=1 SENSITIVE Sensitive     VANCOMYCIN <=0.5 SENSITIVE Sensitive     TRIMETH/SULFA <=10 SENSITIVE Sensitive     CLINDAMYCIN <=0.25 SENSITIVE Sensitive     RIFAMPIN <=0.5 SENSITIVE Sensitive     Inducible Clindamycin NEGATIVE Sensitive     * ABUNDANT STAPHYLOCOCCUS AUREUS      Scheduled Meds: . budesonide (PULMICORT) nebulizer solution  0.5 mg Nebulization BID  . chlorhexidine gluconate (MEDLINE KIT)  15 mL Mouth Rinse BID  . ciprofloxacin  500 mg Per Tube BID  . clonazePAM  0.5 mg Oral Daily  . clonazePAM  1 mg Per Tube QHS  . enoxaparin (LOVENOX) injection  40 mg Subcutaneous Q24H  . famotidine  20 mg Per Tube Daily  . feeding supplement (PRO-STAT SUGAR FREE 64)  30 mL Per Tube BID  . [START ON 12/13/2017] fentaNYL  25 mcg Transdermal Q72H  . free water  50 mL Per Tube Q8H  . ipratropium-albuterol  3 mL Nebulization Q6H  . mouth rinse  15 mL Mouth Rinse 10 times per day  . multivitamin  15 mL Per Tube Daily  . nystatin  5 mL Oral QID  . nystatin cream   Topical BID  . PARoxetine  20 mg Per Tube BID  . potassium chloride  20 mEq Per Tube BID  . QUEtiapine  25 mg Per Tube QHS  . saccharomyces boulardii  250 mg Oral BID  . senna-docusate  1 tablet Per Tube BID  . sodium chloride flush  10-40 mL Intracatheter Q12H  . valproic acid  500 mg Per Tube BID   Continuous Infusions: . feeding supplement (JEVITY 1.5 CAL/FIBER) 1,000 mL (12/11/17 1755)    Assessment/Plan:  1. Ventilator associated pneumonia.  Staph aureus and a gram-negative rod growing out of cultures.  Patient switched to Cipro. 2. Acute on chronic hypoxic respiratory failure status post tracheostomy on high flow trach collar.  Patient having more respiratory distress.  Asked nursing staff and they asked respiratory to reevaluate the patient. 3. Critical illness polyneuropathy.   Patient still very weak. 4. Severe anxiety on Xanax Klonopin and Depakote 5. COPD on nebulizer treatments 6. Stage II decubitus ulcer on sacrum.  Local wound care 7. Anemia of chronic disease.  Hemoglobin 8.2. 8. Nutrition.  Continue tube feeding as per dietary.   Code Status:  Code Status Orders  (From admission, onward)        Start     Ordered   10/28/17 1707  Limited resuscitation (code)  Continuous    Question Answer Comment  In the event of cardiac or respiratory ARREST: Initiate Code Blue, Call Rapid Response No   In the event of cardiac or respiratory ARREST: Perform CPR No   In the event of cardiac or respiratory ARREST: Perform Intubation/Mechanical Ventilation Yes   In the event of cardiac or respiratory ARREST: Use NIPPV/BiPAp only if indicated No   In the event of cardiac or respiratory ARREST: Administer ACLS medications if indicated No   In the event of cardiac or respiratory ARREST: Perform Defibrillation or Cardioversion if indicated No   Comments Continue trach and ventilator. No resusitation if heart stops.      10/28/17 1711    Code Status History    Date Active Date Inactive Code Status Order ID Comments User Context   09/30/2017 1945 10/28/2017 1711 Full Code [id number]  [name], MD Inpatient   08/20/2017 1713 08/24/2017 1743 Full Code [id number]  Dustin Flock, MD Inpatient   06/29/2017 0625 07/02/2017 2008 Full Code [id number]  Harrie Foreman, MD Inpatient   02/23/2017 0241 02/26/2017 1634 Full Code [id number]  Harrie Foreman, MD Inpatient   01/01/2017 1144 01/04/2017 1354 Full Code [id number]  Bettey Costa, MD Inpatient   05/22/2016 2155 05/27/2016 1645 Full Code [id number]  Fritzi Mandes, MD Inpatient   04/23/2016 2020 04/27/2016 1848 Full Code [id number]  Hower, Aaron Mose, MD ED   02/07/2016 1922 02/10/2016 1835 Full Code [id number]  Gladstone Lighter, MD Inpatient   11/29/2015 0519 12/01/2015 1609 Full Code [id number]  Harrie Foreman, MD Inpatient    11/05/2015 0911 11/07/2015 1606 Full Code [id number]  Hillary Bow, MD ED   10/24/2014 1522 10/27/2014 1630 Full Code [id number]  Hillary Bow, MD Inpatient     Family Communication: As per critical care specialist. Disposition Plan: No plan at this point.  Antibiotics:   Cipro  Time spent: 25 minutes, case discussed with nursing staff.  Anwar Sakata [employer]

## 2017-12-12 NOTE — Progress Notes (Signed)
PT Cancellation Note  Patient Details Name: Michelle [name] MRN: [id number] DOB: Mar 28, 1961   Cancelled Treatment:     Order received, chart reviewed. PT attempted to see pt on this date. Pt more alert than previous sessions however pt demonstrates increased respiratory distress and inability to initiate meaningful extremity movement to participate. Pt provided pt with education packet about positioning and exercises for pts caregivers upon d/c. PT plans to d/c pt in house. If changes indicate need for further PT please indicate so through new order.  Yolonda Kida, SPT    Lilliam Chamblee 12/12/2017, 5:15 PM

## 2017-12-12 NOTE — Progress Notes (Signed)
Upon assessment, pt remains anxious, is tachypneic with tachycardia and hypertension, complaining of SOB after morphine given by previous RN. Marda Stalker, NP called to bedside. PRN xanax given at this time and emotional support provided to patient including education regarding slow deep breathing.

## 2017-12-12 NOTE — Progress Notes (Signed)
CRITICAL CARE NOTE  CC  On vent  SUBJECTIVE Pneumonia being treated with IV abx No acute events last night Remains on vent Alert and awake     SIGNIFICANT EVENTS No acute events overnight   BP 101/64   Pulse 86   Temp 99.1 F (37.3 C) (Axillary)   Resp 16   Ht 4\' 9"  (1.448 m)   Wt 121 lb 0.5 oz (54.9 kg)   LMP 03/06/2005 (Approximate)   SpO2 95%   BMI 26.19 kg/m    REVIEW OF SYSTEMS Limited due to vent   PHYSICAL EXAMINATION:  GENERAL:critically ill appearing s/p trcah HEAD: Normocephalic, atraumatic.  EYES: Pupils equal, round, reactive to light.  No scleral icterus.  MOUTH: Moist mucosal membrane. NECK: Supple. No thyromegaly. No nodules. No JVD.  PULMONARY: +rhonchi CARDIOVASCULAR: S1 and S2. Regular rate and rhythm. No murmurs, rubs, or gallops.  GASTROINTESTINAL: Soft, nontender, -distended. No masses. Positive bowel sounds. No hepatosplenomegaly. S/p peg MUSCULOSKELETAL: No swelling, clubbing, or edema.  NEUROLOGIC: alert and awake SKIN:intact,warm,dry  ASSESSMENT AND PLAN    Severe Hypoxic and Hypercapnic Respiratory Failure -failure to wean from vent Plan for ps mode as tolerated  Severe END stage COPD BD therapy Vent support Oxygen support  Staph and pseudomonas pneumonia IV abx for 14 days   ID -continue IV abx as prescibed -follow up cultures   DVT/GI PRX ordered TRANSFUSIONS AS NEEDED MONITOR FSBS ASSESS the need for LABS as needed  LTAC versus HOME WITH VENT  Corrin Parker, M.D.  [facility]  Medical Director [facility] Director [place] Department

## 2017-12-12 NOTE — Progress Notes (Signed)
Morphine given.  Stach rate 130's and tachypnic.  Report given to [place], Therapist, sports.

## 2017-12-12 NOTE — Progress Notes (Signed)
RN made Hinton Dyer, NP aware that patient is short of breath and anxious after being turned and repositioned in bed. NP stated she would order morphine.

## 2017-12-13 LAB — GLUCOSE, CAPILLARY
GLUCOSE-CAPILLARY: 109 mg/dL — AB (ref 70–99)
GLUCOSE-CAPILLARY: 98 mg/dL (ref 70–99)
Glucose-Capillary: 87 mg/dL (ref 70–99)

## 2017-12-13 NOTE — Progress Notes (Signed)
Patient ID: Michelle [name], female     [employer] PROGRESS NOTE  Michelle [name]:[id number] DOB: 1960-11-23 DOA: 09/30/2017 PCP: [facility]  HPI/Subjective: Patient on the portable vent today and able to talk over the vent and ask questions.  Patient asked me if she needed to be on the ventilator lifelong.  States she feels okay.  Objective: Vitals:   12/13/17 1529 12/13/17 1600  BP:  104/63  Pulse:  84  Resp:  16  Temp:    SpO2: 96% 96%    Filed Weights   12/02/17 0247 12/03/17 0400 12/11/17 0436  Weight: 58.1 kg (128 lb 1.4 oz) 56.8 kg (125 lb 3.5 oz) 54.9 kg (121 lb 0.5 oz)    ROS: Review of Systems  Unable to perform ROS: Acuity of condition  Respiratory: Negative for shortness of breath.   Cardiovascular: Negative for chest pain.  Gastrointestinal: Negative for abdominal pain.  Musculoskeletal: Negative for joint pain.  Neurological: Negative for headaches.   Exam: Physical Exam  HENT:  Nose: No mucosal edema.  Eyes: Pupils are equal, round, and reactive to light. Conjunctivae and lids are normal.  Neck: No JVD present. Carotid bruit is not present. No edema present. No thyroid mass and no thyromegaly present.  Cardiovascular: S1 normal and S2 normal. Exam reveals no gallop.  No murmur heard. Pulses:      Dorsalis pedis pulses are 2+ on the right side, and 2+ on the left side.  Respiratory: No accessory muscle usage. She has decreased breath sounds in the right lower field and the left lower field. She has no wheezes. She has no rhonchi. She has no rales.  GI: Soft. Bowel sounds are normal. There is no tenderness.  Musculoskeletal:       Right ankle: She exhibits no swelling.       Left ankle: She exhibits no swelling.  Lymphadenopathy:    She has no cervical adenopathy.  Neurological: She is alert.  Unable to lift up her legs up off the bed.  Able to lift left elbow up off the bed.  Able to lift right forearm up off the bed.  Able to  wiggle toes.  Skin: Skin is warm. No rash noted. Nails show no clubbing.  Psychiatric: She has a normal mood and affect.      Data Reviewed: Basic Metabolic Panel: Recent Labs  Lab 12/07/17 0545 12/10/17 0450  NA 141 140  K 4.5 4.0  CL 101 103  CO2 32 33*  GLUCOSE 99 113*  BUN 21* 19  CREATININE <0.30* 0.36*  CALCIUM 8.6* 8.4*  MG  --  1.9  PHOS  --  4.8*   Liver Function Tests: Recent Labs  Lab 12/07/17 0545  AST 18  ALT 8  ALKPHOS 44  BILITOT 0.1*  PROT 6.6  ALBUMIN 2.0*    Critical care team to teach the family how to do the event.  CBC: Recent Labs  Lab 12/07/17 0545 12/10/17 0450 12/11/17 0429  WBC 7.4 9.4 10.9  NEUTROABS  --   --  7.3*  HGB 8.2* 7.5* 8.5*  HCT 25.8* 23.8* 25.9*  MCV 85.9 85.0 84.1  PLT 284 314 360   BNP (last 3 results) Recent Labs    06/29/17 0412 08/20/17 1337 09/30/17 1705  BNP 38.0 33.0 66.0    CBG: Recent Labs  Lab 12/12/17 0810 12/12/17 1626 12/12/17 2357 12/13/17 0745 12/13/17 1622  GLUCAP 103* 93 109* 109* 98  Recent Results (from the past 240 hour(s))  Culture, respiratory (NON-Expectorated)     Status: None   Collection Time: 12/04/17 10:38 AM  Result Value Ref Range Status   Specimen Description   Final    TRACHEAL ASPIRATE Performed at [facility], [address], [place], [place] [address]    Special Requests   Final    NONE Performed at [facility], [address] [place], [place] [address]    Gram Stain   Final    MODERATE WBC PRESENT, PREDOMINANTLY PMN FEW GRAM POSITIVE COCCI RARE GRAM NEGATIVE RODS Performed at [facility], [place] [address] [place], [place] [address]    Culture ABUNDANT STAPHYLOCOCCUS AUREUS  Final   Report Status 12/06/2017 FINAL  Final   Organism ID, Bacteria STAPHYLOCOCCUS AUREUS  Final      Susceptibility   Staphylococcus aureus - MIC*    CIPROFLOXACIN <=0.5 SENSITIVE Sensitive     ERYTHROMYCIN <=0.25 SENSITIVE Sensitive      GENTAMICIN <=0.5 SENSITIVE Sensitive     OXACILLIN <=0.25 SENSITIVE Sensitive     TETRACYCLINE <=1 SENSITIVE Sensitive     VANCOMYCIN <=0.5 SENSITIVE Sensitive     TRIMETH/SULFA <=10 SENSITIVE Sensitive     CLINDAMYCIN <=0.25 SENSITIVE Sensitive     RIFAMPIN <=0.5 SENSITIVE Sensitive     Inducible Clindamycin NEGATIVE Sensitive     * ABUNDANT STAPHYLOCOCCUS AUREUS      Scheduled Meds: . budesonide (PULMICORT) nebulizer solution  0.5 mg Nebulization BID  . chlorhexidine gluconate (MEDLINE KIT)  15 mL Mouth Rinse BID  . ciprofloxacin  500 mg Per Tube BID  . clonazePAM  0.5 mg Oral Daily  . clonazePAM  1 mg Per Tube QHS  . enoxaparin (LOVENOX) injection  40 mg Subcutaneous Q24H  . famotidine  20 mg Per Tube Daily  . feeding supplement (PRO-STAT SUGAR FREE 64)  30 mL Per Tube BID  . fentaNYL  25 mcg Transdermal Q72H  . free water  50 mL Per Tube Q8H  . ipratropium-albuterol  3 mL Nebulization Q6H  . mouth rinse  15 mL Mouth Rinse 10 times per day  . multivitamin  15 mL Per Tube Daily  . nystatin  5 mL Oral QID  . nystatin cream   Topical BID  . PARoxetine  20 mg Per Tube BID  . potassium chloride  20 mEq Per Tube BID  . QUEtiapine  25 mg Per Tube QHS  . saccharomyces boulardii  250 mg Oral BID  . senna-docusate  1 tablet Per Tube BID  . sodium chloride flush  10-40 mL Intracatheter Q12H  . valproic acid  500 mg Per Tube BID   Continuous Infusions: . feeding supplement (JEVITY 1.5 CAL/FIBER) 1,000 mL (12/11/17 1755)    Assessment/Plan:  1. Ventilator associated pneumonia.  Staph aureus and a gram-negative rod growing out of cultures.  Patient switched to Cipro. 2. Acute on chronic hypoxic respiratory failure status post tracheostomy on portable ventilator.  Patient actually looks the best today than I have seen her on the 3 times I rounded on her.  Patient actually able to talk over the ventilator and unable to understand her. 3. Critical illness polyneuropathy.  Patient still  very weak.  Starting to move her arms a little bit better today 4. Severe anxiety on Xanax Klonopin and Depakote 5. COPD on nebulizer treatments 6. Stage II decubitus ulcer on sacrum.  Local wound care 7. Anemia of chronic disease.  Hemoglobin 8.2. 8. Nutrition.  Continue tube feeding as per dietary.   Code Status:     Code Status Orders  (From admission, onward)        Start     Ordered   10/28/17 1707  Limited resuscitation (code)  Continuous    Question Answer Comment  In the event of cardiac or respiratory ARREST: Initiate Code Blue, Call Rapid Response No   In the event of cardiac or respiratory ARREST: Perform CPR No   In the event of cardiac or respiratory ARREST: Perform Intubation/Mechanical Ventilation Yes   In the event of cardiac or respiratory ARREST: Use NIPPV/BiPAp only if indicated No   In the event of cardiac or respiratory ARREST: Administer ACLS medications if indicated No   In the event of cardiac or respiratory ARREST: Perform Defibrillation or Cardioversion if indicated No   Comments Continue trach and ventilator. No resusitation if heart stops.      10/28/17 1711    Code Status History    Date Active Date Inactive Code Status Order ID Comments User Context   09/30/2017 1945 10/28/2017 1711 Full Code [id number]  [name], MD Inpatient   08/20/2017 1713 08/24/2017 1743 Full Code [id number]  Dustin Flock, MD Inpatient   06/29/2017 0625 07/02/2017 2008 Full Code [id number]  Harrie Foreman, MD Inpatient   02/23/2017 0241 02/26/2017 1634 Full Code [id number]  Harrie Foreman, MD Inpatient   01/01/2017 1144 01/04/2017 1354 Full Code [id number]  Bettey Costa, MD Inpatient   05/22/2016 2155 05/27/2016 1645 Full Code [id number]  Fritzi Mandes, MD Inpatient   04/23/2016 2020 04/27/2016 1848 Full Code [id number]  Hower, Aaron Mose, MD ED   02/07/2016 1922 02/10/2016 1835 Full Code [id number]  Gladstone Lighter, MD Inpatient   11/29/2015 0519 12/01/2015 1609 Full Code  [id number]  Harrie Foreman, MD Inpatient   11/05/2015 0911 11/07/2015 1606 Full Code [id number]  Hillary Bow, MD ED   10/24/2014 1522 10/27/2014 1630 Full Code [id number]  Hillary Bow, MD Inpatient     Family Communication: As per critical care specialist. Disposition Plan: No plan at this point.  Antibiotics:   Cipro  Time spent: 25 minutes, case discussed with nursing staff.  Torryn Hudspeth [employer]

## 2017-12-13 NOTE — Plan of Care (Signed)
  Problem: Nutrition: Goal: Adequate nutrition will be maintained Outcome: Progressing   Problem: Coping: Goal: Level of anxiety will decrease Outcome: Progressing   Problem: Skin Integrity: Goal: Risk for impaired skin integrity will decrease Outcome: Progressing   Problem: Activity: Goal: Ability to tolerate increased activity will improve Outcome: Not Met (add Reason)   Problem: Respiratory: Goal: Ability to maintain a clear airway and adequate ventilation will improve Outcome: Not Met (add Reason)

## 2017-12-13 NOTE — Clinical Social Work Note (Signed)
RN CM is still working toward a home discharge with family and agencies. CSW is updating [place] of patient in the event patient will need to transition to vent/snf and cannot go home Shela Leff MSW,LCSW [phone]

## 2017-12-14 ENCOUNTER — Inpatient Hospital Stay: Payer: Medicaid Other

## 2017-12-14 LAB — BASIC METABOLIC PANEL
Anion gap: 4 — ABNORMAL LOW (ref 5–15)
BUN: 27 mg/dL — AB (ref 6–20)
CHLORIDE: 105 mmol/L (ref 98–111)
CO2: 32 mmol/L (ref 22–32)
Calcium: 8.7 mg/dL — ABNORMAL LOW (ref 8.9–10.3)
Creatinine, Ser: 0.41 mg/dL — ABNORMAL LOW (ref 0.44–1.00)
GFR calc non Af Amer: >60 mL/min (ref >60)
Glucose, Bld: 99 mg/dL (ref 70–99)
Potassium: 4.3 mmol/L (ref 3.5–5.1)
SODIUM: 141 mmol/L (ref 135–145)

## 2017-12-14 LAB — GLUCOSE, CAPILLARY
GLUCOSE-CAPILLARY: 87 mg/dL (ref 70–99)
GLUCOSE-CAPILLARY: 108 mg/dL — AB (ref 70–99)
Glucose-Capillary: 86 mg/dL (ref 70–99)

## 2017-12-14 LAB — CBC
HEMATOCRIT: 26.5 % — AB (ref 35.0–47.0)
HEMOGLOBIN: 8.6 g/dL — AB (ref 12.0–16.0)
MCH: 27.2 pg (ref 26.0–34.0)
MCHC: 32.4 g/dL (ref 32.0–36.0)
MCV: 84 fL (ref 80.0–100.0)
Platelets: 300 10*3/uL (ref 150–440)
RBC: 3.15 MIL/uL — ABNORMAL LOW (ref 3.80–5.20)
RDW: 19.4 % — ABNORMAL HIGH (ref 11.5–14.5)
WBC: 6.6 10*3/uL (ref 3.6–11.0)

## 2017-12-14 MED ORDER — VALPROIC ACID 250 MG/5ML PO SOLN
250.0000 mg | Freq: Two times a day (BID) | ORAL | Status: AC
  Administered 2017-12-14 – 2017-12-15 (×3): 250 mg
  Filled 2017-12-14 (×3): qty 5

## 2017-12-14 MED ORDER — CLONAZEPAM 0.5 MG PO TABS
0.5000 mg | ORAL_TABLET | Freq: Every day | ORAL | Status: DC
  Administered 2017-12-15 – 2017-12-31 (×17): 0.5 mg
  Filled 2017-12-14 (×17): qty 1

## 2017-12-14 MED ORDER — ALPRAZOLAM 0.25 MG PO TABS
0.2500 mg | ORAL_TABLET | Freq: Three times a day (TID) | ORAL | Status: DC | PRN
Start: 2017-12-14 — End: 2017-12-31
  Administered 2017-12-16 – 2017-12-31 (×9): 0.25 mg
  Filled 2017-12-14 (×9): qty 1

## 2017-12-14 NOTE — Progress Notes (Signed)
Patient ID: Michelle [name], female     [employer] PROGRESS NOTE  Michelle [name] QPR:[id number] DOB: Apr 24, 1961 DOA: 09/30/2017 PCP: [facility]  HPI/Subjective: Patient on the portable vent today and able to talk over the vent and ask questions. States she feels okay.  Objective: Vitals:   12/14/17 1100 12/14/17 1300  BP: (!) 120/95 101/79  Pulse: 85 75  Resp: 18 17  Temp:    SpO2: 97% 99%    Filed Weights   12/02/17 0247 12/03/17 0400 12/11/17 0436  Weight: 58.1 kg (128 lb 1.4 oz) 56.8 kg (125 lb 3.5 oz) 54.9 kg (121 lb 0.5 oz)    ROS: Review of Systems  Unable to perform ROS: Acuity of condition  Respiratory: Negative for shortness of breath.   Cardiovascular: Negative for chest pain.  Gastrointestinal: Negative for abdominal pain.  Musculoskeletal: Negative for joint pain.  Neurological: Negative for headaches.   Exam: Physical Exam  HENT:  Nose: No mucosal edema.  Eyes: Pupils are equal, round, and reactive to light. Conjunctivae and lids are normal.  Neck: No JVD present. Carotid bruit is not present. No edema present. No thyroid mass and no thyromegaly present.  Cardiovascular: S1 normal and S2 normal. Exam reveals no gallop.  No murmur heard. Pulses:      Dorsalis pedis pulses are 2+ on the right side, and 2+ on the left side.  Respiratory: No accessory muscle usage. She has decreased breath sounds in the right lower field and the left lower field. She has no wheezes. She has no rhonchi. She has no rales.  GI: Soft. Bowel sounds are normal. There is no tenderness.  Musculoskeletal:       Right ankle: She exhibits no swelling.       Left ankle: She exhibits no swelling.  Lymphadenopathy:    She has no cervical adenopathy.  Neurological: She is alert.  Skin: Skin is warm. No rash noted. Nails show no clubbing.  Psychiatric: She has a normal mood and affect.      Data Reviewed: Basic Metabolic Panel: Recent Labs  Lab 12/10/17 0450  12/14/17 0436  NA 140 141  K 4.0 4.3  CL 103 105  CO2 33* 32  GLUCOSE 113* 99  BUN 19 27*  CREATININE 0.36* 0.41*  CALCIUM 8.4* 8.7*  MG 1.9  --   PHOS 4.8*  --    Liver Function Tests: No results for input(s): AST, ALT, ALKPHOS, BILITOT, PROT, ALBUMIN in the last 168 hours.  Critical care team to teach the family how to do the event.  CBC: Recent Labs  Lab 12/10/17 0450 12/11/17 0429 12/14/17 0436  WBC 9.4 10.9 6.6  NEUTROABS  --  7.3*  --   HGB 7.5* 8.5* 8.6*  HCT 23.8* 25.9* 26.5*  MCV 85.0 84.1 84.0  PLT 314 360 300   BNP (last 3 results) Recent Labs    06/29/17 0412 08/20/17 1337 09/30/17 1705  BNP 38.0 33.0 66.0    CBG: Recent Labs  Lab 12/13/17 0745 12/13/17 1622 12/13/17 2120 12/14/17 0030 12/14/17 0725  GLUCAP 109* 98 87 86 87    No results found for this or any previous visit (from the past 240 hour(s)).    Scheduled Meds: . budesonide (PULMICORT) nebulizer solution  0.5 mg Nebulization BID  . chlorhexidine gluconate (MEDLINE KIT)  15 mL Mouth Rinse BID  . [START ON 12/15/2017] clonazePAM  0.5 mg Per Tube Daily  . clonazePAM  1 mg Per  Tube QHS  . enoxaparin (LOVENOX) injection  40 mg Subcutaneous Q24H  . famotidine  20 mg Per Tube Daily  . feeding supplement (PRO-STAT SUGAR FREE 64)  30 mL Per Tube BID  . fentaNYL  25 mcg Transdermal Q72H  . free water  50 mL Per Tube Q8H  . ipratropium-albuterol  3 mL Nebulization Q6H  . mouth rinse  15 mL Mouth Rinse 10 times per day  . multivitamin  15 mL Per Tube Daily  . nystatin  5 mL Oral QID  . nystatin cream   Topical BID  . PARoxetine  20 mg Per Tube BID  . potassium chloride  20 mEq Per Tube BID  . QUEtiapine  25 mg Per Tube QHS  . saccharomyces boulardii  250 mg Oral BID  . senna-docusate  1 tablet Per Tube BID  . sodium chloride flush  10-40 mL Intracatheter Q12H  . valproic acid  250 mg Per Tube BID   Continuous Infusions: . feeding supplement (JEVITY 1.5 CAL/FIBER) 1,000 mL (12/14/17  0017)    Assessment/Plan:  1. Ventilator associated pneumonia.  Staph aureus and a gram-negative rod growing out of cultures.  Patient switched to Cipro. 2. Acute on chronic hypoxic respiratory failure status post tracheostomy on portable ventilator.  Patient actually looks the best today than I have seen her on the 3 times I rounded on her.  Patient actually able to talk over the ventilator and unable to understand her. 3. Critical illness polyneuropathy.  Patient still very weak.  Starting to move her arms a little bit better today 4. Severe anxiety on Xanax Klonopin and Depakote 5. COPD on nebulizer treatments 6. Stage II decubitus ulcer on sacrum.  Local wound care 7. Anemia of chronic disease.  Hemoglobin 8.2. 8. Nutrition.  Continue tube feeding as per dietary.   Code Status:     Code Status Orders  (From admission, onward)        Start     Ordered   10/28/17 1707  Limited resuscitation (code)  Continuous    Question Answer Comment  In the event of cardiac or respiratory ARREST: Initiate Code Blue, Call Rapid Response No   In the event of cardiac or respiratory ARREST: Perform CPR No   In the event of cardiac or respiratory ARREST: Perform Intubation/Mechanical Ventilation Yes   In the event of cardiac or respiratory ARREST: Use NIPPV/BiPAp only if indicated No   In the event of cardiac or respiratory ARREST: Administer ACLS medications if indicated No   In the event of cardiac or respiratory ARREST: Perform Defibrillation or Cardioversion if indicated No   Comments Continue trach and ventilator. No resusitation if heart stops.      10/28/17 1711    Code Status History    Date Active Date Inactive Code Status Order ID Comments User Context   09/30/2017 1945 10/28/2017 1711 Full Code [id number]  [name], MD Inpatient   08/20/2017 1713 08/24/2017 1743 Full Code [id number]  Dustin Flock, MD Inpatient   06/29/2017 0625 07/02/2017 2008 Full Code [id number]  Harrie Foreman, MD Inpatient   02/23/2017 0241 02/26/2017 1634 Full Code [id number]  Harrie Foreman, MD Inpatient   01/01/2017 1144 01/04/2017 1354 Full Code [id number]  Bettey Costa, MD Inpatient   05/22/2016 2155 05/27/2016 1645 Full Code [id number]  Fritzi Mandes, MD Inpatient   04/23/2016 2020 04/27/2016 1848 Full Code [id number]  Hower, Aaron Mose, MD ED   02/07/2016 1922 02/10/2016 1835 Full Code [id number]  Gladstone Lighter, MD Inpatient   11/29/2015 0519 12/01/2015 1609 Full Code [id number]  Harrie Foreman, MD Inpatient   11/05/2015 0911 11/07/2015 1606 Full Code [id number]  Hillary Bow, MD ED   10/24/2014 1522 10/27/2014 1630 Full Code [id number]  Hillary Bow, MD Inpatient     Family Communication: As per critical care specialist. Disposition Plan: No plan at this point.  Antibiotics:   Cipro  Time spent: 25 minutes, case discussed with nursing staff.  Fritzi Mandes  [employer]

## 2017-12-14 NOTE — Progress Notes (Signed)
Not making any progress weaning. Remains on home vent and is well supported. RASS -1, 0. Cognition intact. No complaints  Vitals:   12/14/17 0826 12/14/17 0900 12/14/17 1000 12/14/17 1100  BP:  107/65 122/87 (!) 120/95  Pulse:  82 83 85  Resp:  16 (!) 22 18  Temp:      TempSrc:      SpO2: 100% 97% 97% 97%  Weight:      Height:       NAD, RASS 0 HEENT: NCAT, sclerae white No JVD noted Diminished BS, no wheezes Reg, no M Abdomen soft, NT, NABS Moderate BUE, BLE pitting edema No focal deficits   BMP Latest Ref Rng & Units 12/14/2017 12/10/2017 12/07/2017  Glucose 70 - 99 mg/dL 99 113(H) 99  BUN 6 - 20 mg/dL 27(H) 19 21(H)  Creatinine 0.44 - 1.00 mg/dL 0.41(L) 0.36(L) <0.30(L)  Sodium 135 - 145 mmol/L 141 140 141  Potassium 3.5 - 5.1 mmol/L 4.3 4.0 4.5  Chloride 98 - 111 mmol/L 105 103 101  CO2 22 - 32 mmol/L 32 33(H) 32  Calcium 8.9 - 10.3 mg/dL 8.7(L) 8.4(L) 8.6(L)    CBC Latest Ref Rng & Units 12/14/2017 12/11/2017 12/10/2017  WBC 3.6 - 11.0 K/uL 6.6 10.9 9.4  Hemoglobin 12.0 - 16.0 g/dL 8.6(L) 8.5(L) 7.5(L)  Hematocrit 35.0 - 47.0 % 26.5(L) 25.9(L) 23.8(L)  Platelets 150 - 440 K/uL 300 360 314   Results for orders placed or performed during the hospital encounter of 09/30/17  CULTURE, BLOOD (ROUTINE X 2) w Reflex to ID Panel     Status: None   Collection Time: 09/30/17  5:43 PM  Result Value Ref Range Status   Specimen Description BLOOD CENTRAL LINE  Final   Special Requests   Final    BOTTLES DRAWN AEROBIC AND ANAEROBIC Blood Culture adequate volume   Culture   Final    NO GROWTH 5 DAYS Performed at [facility], [address] [place], [place] [address]    Report Status 10/05/2017 FINAL  Final  MRSA PCR Screening     Status: None   Collection Time: 09/30/17  8:02 PM  Result Value Ref Range Status   MRSA by PCR NEGATIVE NEGATIVE Final    Comment:        The GeneXpert MRSA Assay (FDA approved for NASAL specimens only), is one component of a comprehensive  MRSA colonization surveillance program. It is not intended to diagnose MRSA infection nor to guide or monitor treatment for MRSA infections. Performed at [facility], [address], [place], [place] [address]   Urine Culture     Status: None   Collection Time: 09/30/17  8:04 PM  Result Value Ref Range Status   Specimen Description   Final    URINE, RANDOM Performed at [facility], [address] [place], [place] [address]    Special Requests   Final    NONE Performed at [facility], [address], [place], [place] [address]    Culture   Final    NO GROWTH Performed at [facility], [place] [address] [place],  [address]    Report Status 10/02/2017 FINAL  Final  CULTURE, BLOOD (ROUTINE X 2) w Reflex to ID Panel     Status: None   Collection Time: 09/30/17  8:16 PM  Result Value Ref Range Status   Specimen Description BLOOD L HAND  Final   Special Requests   Final  BOTTLES DRAWN AEROBIC AND ANAEROBIC Blood Culture results may not be optimal due to an inadequate volume of blood received in culture bottles   Culture   Final    NO GROWTH 5 DAYS Performed at [facility], [address] [place], [place] [address]    Report Status 10/05/2017 FINAL  Final  Culture, respiratory (NON-Expectorated)     Status: None   Collection Time: 09/30/17  9:07 PM  Result Value Ref Range Status   Specimen Description   Final    TRACHEAL ASPIRATE Performed at [facility], [address], [place], [place] [address]    Special Requests   Final    NONE Performed at [facility], [address] [place], [place] [address]    Gram Stain   Final    MODERATE WBC PRESENT, PREDOMINANTLY PMN FEW GRAM POSITIVE COCCI IN PAIRS IN CLUSTERS FEW GRAM NEGATIVE RODS    Culture   Final    FEW Consistent with normal respiratory flora. Performed at [facility], [place] [address] [place], [place] [address]    Report Status  10/03/2017 FINAL  Final  CULTURE, BLOOD (ROUTINE X 2) w Reflex to ID Panel     Status: None   Collection Time: 10/05/17 11:48 AM  Result Value Ref Range Status   Specimen Description BLOOD LAC  Final   Special Requests   Final    BOTTLES DRAWN AEROBIC AND ANAEROBIC Blood Culture adequate volume   Culture   Final    NO GROWTH 5 DAYS Performed at [facility], [address] [place], [place] [address]    Report Status 10/10/2017 FINAL  Final  CULTURE, BLOOD (ROUTINE X 2) w Reflex to ID Panel     Status: None   Collection Time: 10/05/17 11:55 AM  Result Value Ref Range Status   Specimen Description BLOOD LT HAND  Final   Special Requests   Final    BOTTLES DRAWN AEROBIC AND ANAEROBIC Blood Culture adequate volume   Culture   Final    NO GROWTH 5 DAYS Performed at [facility], [address], [place], [place] [address]    Report Status 10/10/2017 FINAL  Final  Culture, expectorated sputum-assessment     Status: None   Collection Time: 10/05/17  2:18 PM  Result Value Ref Range Status   Specimen Description TRACHEAL ASPIRATE  Final   Special Requests NONE  Final   Sputum evaluation   Final    THIS SPECIMEN IS ACCEPTABLE FOR SPUTUM CULTURE Performed at [facility], [address], [place], [place] [address]    Report Status 10/05/2017 FINAL  Final  Culture, respiratory (NON-Expectorated)     Status: None   Collection Time: 10/05/17  2:18 PM  Result Value Ref Range Status   Specimen Description   Final    TRACHEAL ASPIRATE Performed at [facility], [address] [place], [place] [address]    Special Requests   Final    NONE Reflexed from [id number] Performed at [facility], [address] [place], [place] [address]    Gram Stain   Final    MODERATE WBC PRESENT,BOTH PMN AND MONONUCLEAR NO SQUAMOUS EPITHELIAL CELLS SEEN FEW YEAST WITH PSEUDOHYPHAE RARE GRAM POSITIVE COCCI IN CLUSTERS Performed at [facility],  [place] [address], [place], [place] [address]    Culture FEW CANDIDA ALBICANS FEW CANDIDA DUBLINIENSIS   Final   Report Status 10/08/2017 FINAL  Final  CULTURE,  BLOOD (ROUTINE X 2) w Reflex to ID Panel     Status: Abnormal   Collection Time: 10/18/17 10:51 AM  Result Value Ref Range Status   Specimen Description   Final    BLOOD LEFT ANTECUBITAL Performed at [facility], [address] [place], [place] [address]    Special Requests   Final    BOTTLES DRAWN AEROBIC AND ANAEROBIC Blood Culture adequate volume Performed at [facility], [address] [place], [place] [address]    Culture  Setup Time   Final    GRAM POSITIVE COCCI ANAEROBIC BOTTLE ONLY CRITICAL RESULT CALLED TO, READ BACK BY AND VERIFIED WITH: MELISSA MACCIA 10/19/17 @ 2706  [place]    Culture (A)  Final    STAPHYLOCOCCUS SPECIES (COAGULASE NEGATIVE) THE SIGNIFICANCE OF ISOLATING THIS ORGANISM FROM A SINGLE SET OF BLOOD CULTURES WHEN MULTIPLE SETS ARE DRAWN IS UNCERTAIN. PLEASE NOTIFY THE MICROBIOLOGY DEPARTMENT WITHIN ONE WEEK IF SPECIATION AND SENSITIVITIES ARE REQUIRED. Performed at [facility], [place] [address], [place], [place] [address]    Report Status 10/21/2017 FINAL  Final  Blood Culture ID Panel (Reflexed)     Status: Abnormal   Collection Time: 10/18/17 10:51 AM  Result Value Ref Range Status   Enterococcus species NOT DETECTED NOT DETECTED Final   Listeria monocytogenes NOT DETECTED NOT DETECTED Final   Staphylococcus species DETECTED (A) NOT DETECTED Final    Comment: Methicillin (oxacillin) resistant coagulase negative staphylococcus. Possible blood culture contaminant (unless isolated from more than one blood culture draw or clinical case suggests pathogenicity). No antibiotic treatment is indicated for blood  culture contaminants. CRITICAL RESULT CALLED TO, READ BACK BY AND VERIFIED WITH: MELISSA [place] 10/19/17 @ 8315  [place]    Staphylococcus aureus NOT DETECTED NOT DETECTED Final    Methicillin resistance DETECTED (A) NOT DETECTED Final    Comment: CRITICAL RESULT CALLED TO, READ BACK BY AND VERIFIED WITH: MELISSA MACCIA 10/19/17 @ 1761  [place]    Streptococcus species NOT DETECTED NOT DETECTED Final   Streptococcus agalactiae NOT DETECTED NOT DETECTED Final   Streptococcus pneumoniae NOT DETECTED NOT DETECTED Final   Streptococcus pyogenes NOT DETECTED NOT DETECTED Final   Acinetobacter baumannii NOT DETECTED NOT DETECTED Final   Enterobacteriaceae species NOT DETECTED NOT DETECTED Final   Enterobacter cloacae complex NOT DETECTED NOT DETECTED Final   Escherichia coli NOT DETECTED NOT DETECTED Final   Klebsiella oxytoca NOT DETECTED NOT DETECTED Final   Klebsiella pneumoniae NOT DETECTED NOT DETECTED Final   Proteus species NOT DETECTED NOT DETECTED Final   Serratia marcescens NOT DETECTED NOT DETECTED Final   Haemophilus influenzae NOT DETECTED NOT DETECTED Final   Neisseria meningitidis NOT DETECTED NOT DETECTED Final   Pseudomonas aeruginosa NOT DETECTED NOT DETECTED Final   Candida albicans NOT DETECTED NOT DETECTED Final   Candida glabrata NOT DETECTED NOT DETECTED Final   Candida krusei NOT DETECTED NOT DETECTED Final   Candida parapsilosis NOT DETECTED NOT DETECTED Final   Candida tropicalis NOT DETECTED NOT DETECTED Final    Comment: Performed at [name], [address] [place], [place] [address]  Culture, respiratory (NON-Expectorated)     Status: None   Collection Time: 10/18/17 11:09 AM  Result Value Ref Range Status   Specimen Description   Final    TRACHEAL ASPIRATE Performed at [facility], [address], [place],  [address]    Special Requests   Final    NONE Performed at [facility], [place], [place] [address]    Gram Stain   Final    MODERATE WBC PRESENT, PREDOMINANTLY PMN NO ORGANISMS SEEN Performed at [place] [address] [place], [place] [address]    Culture    Final    RARE CANDIDA DUBLINIENSIS RARE FUNGUS (MOLD) ISOLATED, PROBABLE CONTAMINANT/COLONIZER (SAPROPHYTE). CONTACT MICROBIOLOGY IF FURTHER IDENTIFICATION REQUIRED [phone].    Report Status 10/21/2017 FINAL  Final  CULTURE, BLOOD (ROUTINE X 2) w Reflex to ID Panel     Status: None   Collection Time: 10/18/17  4:09 PM  Result Value Ref Range Status   Specimen Description BLOOD L FA  Final   Special Requests   Final    BOTTLES DRAWN AEROBIC AND ANAEROBIC Blood Culture adequate volume   Culture   Final    NO GROWTH 5 DAYS Performed at [facility], [address] [place], [place] [address]    Report Status 10/23/2017 FINAL  Final  CULTURE, BLOOD (ROUTINE X 2) w Reflex to ID Panel     Status: None   Collection Time: 10/19/17  8:36 AM  Result Value Ref Range Status   Specimen Description BLOOD BLOOD LEFT HAND  Final   Special Requests   Final    BOTTLES DRAWN AEROBIC AND ANAEROBIC Blood Culture results may not be optimal due to an inadequate volume of blood received in culture bottles   Culture   Final    NO GROWTH 5 DAYS Performed at [facility], [address] [place], [place] [address]    Report Status 10/24/2017 FINAL  Final  CULTURE, BLOOD (ROUTINE X 2) w Reflex to ID Panel     Status: None   Collection Time: 10/19/17  8:52 AM  Result Value Ref Range Status   Specimen Description BLOOD A-LINE DRAW  Final   Special Requests   Final    BOTTLES DRAWN AEROBIC AND ANAEROBIC Blood Culture adequate volume   Culture   Final    NO GROWTH 5 DAYS Performed at [facility], [address], [place], [place] [address]    Report Status 10/24/2017 FINAL  Final  Culture, respiratory (NON-Expectorated)     Status: None   Collection Time: 10/23/17 11:36 PM  Result Value Ref Range Status   Specimen Description   Final    TRACHEAL ASPIRATE Performed at [facility], [address], [place], [place] [address]    Special Requests   Final     NONE Performed at [facility], [address] [place], [place] [address]    Gram Stain   Final    RARE WBC PRESENT, PREDOMINANTLY PMN NO ORGANISMS SEEN Performed at [facility], [place] [address], [place], [place] [address]    Culture   Final    Consistent with normal respiratory flora. FUNGUS (MOLD) ISOLATED, PROBABLE CONTAMINANT/COLONIZER (SAPROPHYTE). CONTACT MICROBIOLOGY IF FURTHER IDENTIFICATION REQUIRED [phone].    Report Status 10/26/2017 FINAL  Final  Urine Culture     Status: Abnormal   Collection Time: 11/01/17  4:57 AM  Result Value Ref Range Status   Specimen Description   Final    URINE, CLEAN CATCH Performed at [facility], [address], [place], [place] [address]    Special Requests   Final    NONE Performed at [facility], [place] [address] [place],  [address]    Culture (A)  Final    <10,000 COLONIES/mL INSIGNIFICANT GROWTH Performed at  [facility], [place] [address] [place], [place] [address]    Report Status 11/02/2017 FINAL  Final  CULTURE, BLOOD (ROUTINE X 2) w Reflex to ID Panel     Status: None   Collection Time: 11/01/17  1:28 PM  Result Value Ref Range Status   Specimen Description BLOOD LINE  Final   Special Requests   Final    BOTTLES DRAWN AEROBIC AND ANAEROBIC Blood Culture adequate volume   Culture   Final    NO GROWTH 5 DAYS Performed at [facility], [address] [place], [place] [address]    Report Status 11/06/2017 FINAL  Final  CULTURE, BLOOD (ROUTINE X 2) w Reflex to ID Panel     Status: None   Collection Time: 11/01/17  1:51 PM  Result Value Ref Range Status   Specimen Description BLOOD R HAND  Final   Special Requests   Final    BOTTLES DRAWN AEROBIC AND ANAEROBIC Blood Culture results may not be optimal due to an inadequate volume of blood received in culture bottles   Culture   Final    NO GROWTH 5 DAYS Performed at [facility], [address],  [place], [place] [address]    Report Status 11/06/2017 FINAL  Final  Culture, respiratory (NON-Expectorated)     Status: None   Collection Time: 11/04/17 12:20 PM  Result Value Ref Range Status   Specimen Description   Final    SPUTUM Performed at [facility], [address] [place], [place] [address]    Special Requests   Final    NONE Performed at [facility], [address] [place], [place] [address]    Gram Stain   Final    ABUNDANT WBC PRESENT,BOTH PMN AND MONONUCLEAR RARE GRAM POSITIVE COCCI Performed at [facility], [place] [address], [place], [place] [address]    Culture   Final    RARE FUNGUS (MOLD) ISOLATED, PROBABLE CONTAMINANT/COLONIZER (SAPROPHYTE). CONTACT MICROBIOLOGY IF FURTHER IDENTIFICATION REQUIRED [phone].   Report Status 11/06/2017 FINAL  Final  Culture, respiratory (NON-Expectorated)     Status: None   Collection Time: 11/17/17  7:57 AM  Result Value Ref Range Status   Specimen Description   Final    TRACHEAL ASPIRATE Performed at [facility], [address], [place], [place] [address]    Special Requests   Final    NONE Performed at [facility], [address] [place], [place] [address]    Gram Stain   Final    MODERATE WBC PRESENT,BOTH PMN AND MONONUCLEAR MODERATE GRAM POSITIVE COCCI IN PAIRS RARE GRAM NEGATIVE RODS Performed at [facility], [place] [address] [place], [place] [address]    Culture ABUNDANT STAPHYLOCOCCUS AUREUS  Final   Report Status 11/19/2017 FINAL  Final   Organism ID, Bacteria STAPHYLOCOCCUS AUREUS  Final      Susceptibility   Staphylococcus aureus - MIC*    CIPROFLOXACIN <=0.5 SENSITIVE Sensitive     ERYTHROMYCIN <=0.25 SENSITIVE Sensitive     GENTAMICIN <=0.5 SENSITIVE Sensitive     OXACILLIN <=0.25 SENSITIVE Sensitive     TETRACYCLINE <=1 SENSITIVE Sensitive     VANCOMYCIN 1 SENSITIVE Sensitive     TRIMETH/SULFA <=10 SENSITIVE Sensitive     CLINDAMYCIN <=0.25  SENSITIVE Sensitive     RIFAMPIN <=0.5 SENSITIVE Sensitive     Inducible Clindamycin NEGATIVE Sensitive     * ABUNDANT STAPHYLOCOCCUS AUREUS  CULTURE, BLOOD (ROUTINE X 2)  w Reflex to ID Panel     Status: None   Collection Time: 11/17/17  9:09 AM  Result Value Ref Range Status   Specimen Description BLOOD LEFT HAND  Final   Special Requests   Final    BOTTLES DRAWN AEROBIC AND ANAEROBIC Blood Culture adequate volume   Culture   Final    NO GROWTH 5 DAYS Performed at [facility], [address], [place], [place] [address]    Report Status 11/22/2017 FINAL  Final  CULTURE, BLOOD (ROUTINE X 2) w Reflex to ID Panel     Status: Abnormal   Collection Time: 11/17/17 10:05 AM  Result Value Ref Range Status   Specimen Description   Final    BLOOD L HAND Performed at [facility], [address], [place], [place] [address]    Special Requests   Final    BOTTLES DRAWN AEROBIC AND ANAEROBIC Blood Culture adequate volume Performed at [facility], [address] [place], [place] [address]    Culture  Setup Time   Final    GRAM POSITIVE COCCI ANAEROBIC BOTTLE ONLY CRITICAL RESULT CALLED TO, READ BACK BY AND VERIFIED WITH: H. ZOMPA PHARMD, AT 1056 11/20/17 BY D. VANHOOK    Culture (A)  Final    MICROCOCCUS SPECIES Standardized susceptibility testing for this organism is not available. Performed at [facility], [place] [address], [place], [place] [address]    Report Status 11/21/2017 FINAL  Final  Culture, respiratory (NON-Expectorated)     Status: None   Collection Time: 11/24/17  3:57 PM  Result Value Ref Range Status   Specimen Description   Final    SPUTUM Performed at [facility], [address] [place] [address] [place], [place] [address]    Special Requests   Final    NONE Performed at [facility], [address] [place], [place] [address]    Gram Stain   Final    NO WBC SEEN NO ORGANISMS SEEN MODERATE GRAM NEGATIVE RODS FEW GRAM  POSITIVE COCCI IN CLUSTERS    Culture   Final    FEW STAPHYLOCOCCUS AUREUS MODERATE PSEUDOMONAS FLUORESCENS SEE SEPARATE REPORT RESULTS FOR ORGANISM 2 IN CHL/EPIC FOR DOS 11/24/17 Performed at [employer] Performed at [facility], [place] [address] [place], [place] [address]    Report Status 12/04/2017 FINAL  Final   Organism ID, Bacteria STAPHYLOCOCCUS AUREUS  Final      Susceptibility   Staphylococcus aureus - MIC*    CIPROFLOXACIN <=0.5 SENSITIVE Sensitive     ERYTHROMYCIN <=0.25 SENSITIVE Sensitive     GENTAMICIN <=0.5 SENSITIVE Sensitive     OXACILLIN 0.5 SENSITIVE Sensitive     TETRACYCLINE <=1 SENSITIVE Sensitive     VANCOMYCIN <=0.5 SENSITIVE Sensitive     TRIMETH/SULFA <=10 SENSITIVE Sensitive     CLINDAMYCIN <=0.25 SENSITIVE Sensitive     RIFAMPIN <=0.5 SENSITIVE Sensitive     Inducible Clindamycin NEGATIVE Sensitive     * FEW STAPHYLOCOCCUS AUREUS  Susceptibility, Aer + Anaerob     Status: None   Collection Time: 11/24/17  3:57 PM  Result Value Ref Range Status   Suscept, Aer + Anaerob Final report  Corrected    Comment: (NOTE) Performed At: [facility] [address] [place], [place] [id number] Rush Farmer MD [id number] CORRECTED ON 07/03 AT 0537: PREVIOUSLY REPORTED AS Preliminary report    Source of Sample SPUTUM  Final    Comment: Performed at [facility],  [address], [place], [place] [address]  Susceptibility Result     Status: Abnormal   Collection Time: 11/24/17  3:57 PM  Result Value Ref Range Status   Suscept Result 1 Comment (A)  Final    Comment: (NOTE) Pseudomonas fluorescens Identification performed by account, not confirmed by this laboratory.    Antimicrobial Suscept Comment  Final    Comment: (NOTE)      ** S = Susceptible; I = Intermediate; R = Resistant **                   P = Positive; N = Negative            MICS are expressed in micrograms per mL   Antibiotic                 RSLT#1    RSLT#2     RSLT#3    RSLT#4 Amikacin                       S Aztreonam                      R Cefepime                       S Cefotaxime                     R Ceftazidime                    S Ceftriaxone                    R Ciprofloxacin                  S Gentamicin                     S Imipenem                       S Levofloxacin                   S Meropenem                      S Piperacillin/Tazobactam        S Ticarcillin/Clavulanate        R Tobramycin                     S Trimethoprim/Sulfa             R Performed At: [facility] [place], [place] [id number] Rush Farmer MD [id number] Performed at [facility], [place] [address], [place], [place] [address]   CULTURE, BLOOD (ROUTINE X 2) w Reflex to ID Panel     Status: None   Collection Time: 11/24/17  4:29 PM  Result Value Ref Range Status   Specimen Description BLOOD LEFT ASSIST CONTROL  Final   Special Requests   Final    BOTTLES DRAWN AEROBIC AND ANAEROBIC Blood Culture adequate volume   Culture   Final    NO GROWTH 5 DAYS Performed at [facility], [address] [place], [place] [address]    Report Status 11/29/2017 FINAL  Final  CULTURE, BLOOD (ROUTINE X 2) w Reflex to ID Panel     Status: None   Collection Time:  11/24/17  6:08 PM  Result Value Ref Range Status   Specimen Description BLOOD L HAND  Final   Special Requests   Final    BOTTLES DRAWN AEROBIC AND ANAEROBIC Blood Culture adequate volume   Culture   Final    NO GROWTH 5 DAYS Performed at [facility], [address] [place], [place] [address]    Report Status 11/29/2017 FINAL  Final  MRSA PCR Screening     Status: None   Collection Time: 11/30/17 10:16 AM  Result Value Ref Range Status   MRSA by PCR NEGATIVE NEGATIVE Final    Comment:        The GeneXpert MRSA Assay (FDA approved for NASAL specimens only), is one component of a comprehensive MRSA colonization surveillance program. It is  not intended to diagnose MRSA infection nor to guide or monitor treatment for MRSA infections. Performed at [facility], [address] [place], [place] [address]   Urine Culture     Status: Abnormal   Collection Time: 11/30/17  9:31 PM  Result Value Ref Range Status   Specimen Description   Final    URINE, RANDOM Performed at [facility], [address], [place], [place] [address]    Special Requests   Final    NONE Performed at [facility], [address] [place], [place] [address]    Culture (A)  Final    <10,000 COLONIES/mL INSIGNIFICANT GROWTH Performed at [facility], [place] [address] [place], [place] [address]    Report Status 12/02/2017 FINAL  Final  Culture, respiratory (NON-Expectorated)     Status: None   Collection Time: 12/04/17 10:38 AM  Result Value Ref Range Status   Specimen Description   Final    TRACHEAL ASPIRATE Performed at [facility], [address] [place], [place] [address]    Special Requests   Final    NONE Performed at [facility], [address] [place], [place] [address]    Gram Stain   Final    MODERATE WBC PRESENT, PREDOMINANTLY PMN FEW GRAM POSITIVE COCCI RARE GRAM NEGATIVE RODS Performed at [facility], [place] [address] [place], [place] [address]    Culture ABUNDANT STAPHYLOCOCCUS AUREUS  Final   Report Status 12/06/2017 FINAL  Final   Organism ID, Bacteria STAPHYLOCOCCUS AUREUS  Final      Susceptibility   Staphylococcus aureus - MIC*    CIPROFLOXACIN <=0.5 SENSITIVE Sensitive     ERYTHROMYCIN <=0.25 SENSITIVE Sensitive     GENTAMICIN <=0.5 SENSITIVE Sensitive     OXACILLIN <=0.25 SENSITIVE Sensitive     TETRACYCLINE <=1 SENSITIVE Sensitive     VANCOMYCIN <=0.5 SENSITIVE Sensitive     TRIMETH/SULFA <=10 SENSITIVE Sensitive     CLINDAMYCIN <=0.25 SENSITIVE Sensitive     RIFAMPIN <=0.5 SENSITIVE Sensitive     Inducible Clindamycin NEGATIVE Sensitive     *  ABUNDANT STAPHYLOCOCCUS AUREUS    Vanc 06/16 >> 06/18 Cefepime 06/16 >> 06/18 Cefazolin 06/18 >>   CXR: No new film   IMP: Very severe COPD Prolonged mechanical ventilation Failure to wean Tracheostomy tube status MSSA HCAP/VAP  - Fully treated ICU acquired anemia G-tube status Anxiety/panic disorder, well controlled Major depression, controlled Severe protein calorie malnutrition Anasarca, slowly improving  PLAN/REC: Cont vent support Cont to wean in PSV <> ATC as tolerated Cont vent bundle Daily SBT if/when meets criteria  Continue nebulized steroids and bronchodilators Monitor temp, WBC count Micro and  abx as above - DC Cipro today DVT px: enoxaparin Monitor CBC intermittently Transfuse per usual guidelines  Cont scheduled clonazepam, quetiapine  Continue paroxetine per psychiatry Decrease valproic acid Continue PRN alprazolam  No family @ bedside to update  Merton Border, MD PCCM service Mobile [phone] Pager [phone] 12/14/2017 1:04 PM

## 2017-12-14 NOTE — Progress Notes (Signed)
Patient has been asleep on and off this shift. No indications of pain or discomfort. Shaked head to indicate she was not in pain. On trach, not other changes. Still having conversations with family about placement.

## 2017-12-15 MED ORDER — FENTANYL CITRATE (PF) 100 MCG/2ML IJ SOLN
12.5000 ug | INTRAMUSCULAR | Status: DC | PRN
  Administered 2017-12-15 – 2017-12-21 (×12): 12.5 ug via INTRAVENOUS
  Filled 2017-12-15 (×12): qty 2

## 2017-12-15 MED ORDER — WHITE PETROLATUM EX OINT
TOPICAL_OINTMENT | CUTANEOUS | Status: DC | PRN
  Administered 2017-12-16 (×2): 0.2 via TOPICAL
  Filled 2017-12-15 (×2): qty 28.35

## 2017-12-15 NOTE — Progress Notes (Signed)
Patient ID: Michelle [name], female     [employer] PROGRESS NOTE  JULLISA GRIGORYAN ITG:[id number] DOB: [birth year] DOA: 09/30/2017 [facility]: [facility]  HPI/Subjective: Patient on the portable vent today and able to talk over the vent and ask questions. States she feels okay.  Objective: Vitals:   12/15/17 1000 12/15/17 1100  BP: 114/69 115/79  Pulse: 80 82  Resp: 17 19  Temp:    SpO2: 96% 97%    Filed Weights   12/02/17 0247 12/03/17 0400 12/11/17 0436  Weight: 58.1 kg (128 lb 1.4 oz) 56.8 kg (125 lb 3.5 oz) 54.9 kg (121 lb 0.5 oz)    ROS: Review of Systems  Unable to perform ROS: Acuity of condition  Respiratory: Negative for shortness of breath.   Cardiovascular: Negative for chest pain.  Gastrointestinal: Negative for abdominal pain.  Musculoskeletal: Negative for joint pain.  Neurological: Negative for headaches.   Exam: Physical Exam  HENT:  Nose: No mucosal edema.  Eyes: Pupils are equal, round, and reactive to light. Conjunctivae and lids are normal.  Neck: No JVD present. Carotid bruit is not present. No edema present. No thyroid mass and no thyromegaly present.  Cardiovascular: S1 normal and S2 normal. Exam reveals no gallop.  No murmur heard. Pulses:      Dorsalis pedis pulses are 2+ on the right side, and 2+ on the left side.  Respiratory: No accessory muscle usage. She has decreased breath sounds in the right lower field and the left lower field. She has no wheezes. She has no rhonchi. She has no rales.  GI: Soft. Bowel sounds are normal. There is no tenderness.  Musculoskeletal:       Right ankle: She exhibits no swelling.       Left ankle: She exhibits no swelling.  Lymphadenopathy:    She has no cervical adenopathy.  Neurological: She is alert.  Skin: Skin is warm. No rash noted. Nails show no clubbing.  Psychiatric: She has a normal mood and affect.      Data Reviewed: Basic Metabolic Panel: Recent Labs  Lab 12/10/17 0450  12/14/17 0436  NA 140 141  K 4.0 4.3  CL 103 105  CO2 33* 32  GLUCOSE 113* 99  BUN 19 27*  CREATININE 0.36* 0.41*  CALCIUM 8.4* 8.7*  MG 1.9  --   PHOS 4.8*  --    Liver Function Tests: No results for input(s): AST, ALT, ALKPHOS, BILITOT, PROT, ALBUMIN in the last 168 hours.  Critical care team to teach the family how to do the event.  CBC: Recent Labs  Lab 12/10/17 0450 12/11/17 0429 12/14/17 0436  WBC 9.4 10.9 6.6  NEUTROABS  --  7.3*  --   HGB 7.5* 8.5* 8.6*  HCT 23.8* 25.9* 26.5*  MCV 85.0 84.1 84.0  PLT 314 360 300   BNP (last 3 results) Recent Labs    06/29/17 0412 08/20/17 1337 09/30/17 1705  BNP 38.0 33.0 66.0    CBG: Recent Labs  Lab 12/13/17 1622 12/13/17 2120 12/14/17 0030 12/14/17 0725 12/14/17 2327  GLUCAP 98 87 86 87 108*    No results found for this or any previous visit (from the past 240 hour(s)).    Scheduled Meds: . budesonide (PULMICORT) nebulizer solution  0.5 mg Nebulization BID  . chlorhexidine gluconate (MEDLINE KIT)  15 mL Mouth Rinse BID  . clonazePAM  0.5 mg Per Tube Daily  . clonazePAM  1 mg Per Tube QHS  .  enoxaparin (LOVENOX) injection  40 mg Subcutaneous Q24H  . famotidine  20 mg Per Tube Daily  . feeding supplement (PRO-STAT SUGAR FREE 64)  30 mL Per Tube BID  . fentaNYL  25 mcg Transdermal Q72H  . free water  50 mL Per Tube Q8H  . ipratropium-albuterol  3 mL Nebulization Q6H  . mouth rinse  15 mL Mouth Rinse 10 times per day  . multivitamin  15 mL Per Tube Daily  . nystatin  5 mL Oral QID  . nystatin cream   Topical BID  . PARoxetine  20 mg Per Tube BID  . potassium chloride  20 mEq Per Tube BID  . QUEtiapine  25 mg Per Tube QHS  . saccharomyces boulardii  250 mg Oral BID  . senna-docusate  1 tablet Per Tube BID  . sodium chloride flush  10-40 mL Intracatheter Q12H  . valproic acid  250 mg Per Tube BID   Continuous Infusions: . feeding supplement (JEVITY 1.5 CAL/FIBER) 1,000 mL (12/15/17 0016)     Assessment/Plan:  1. Ventilator associated pneumonia.  Staph aureus and a gram-negative rod growing out of cultures.  Patient switched to Cipro. 2. Acute on chronic hypoxic respiratory failure status post tracheostomy on portable ventilator.  Patient actually looks the best today than I have seen her on the 3 times I rounded on her.  Patient actually able to talk over the ventilator and unable to understand her. 3. Critical illness polyneuropathy.  Patient still very weak.  Starting to move her arms a little bit better today 4. Severe anxiety on Xanax Klonopin and Depakote 5. COPD on nebulizer treatments 6. Stage II decubitus ulcer on sacrum.  Local wound care 7. Anemia of chronic disease.  Hemoglobin 8.2. 8. Nutrition.  Continue tube feeding as per dietary.   Code Status:     Code Status Orders  (From admission, onward)        Start     Ordered   10/28/17 1707  Limited resuscitation (code)  Continuous    Question Answer Comment  In the event of cardiac or respiratory ARREST: Initiate Code Blue, Call Rapid Response No   In the event of cardiac or respiratory ARREST: Perform CPR No   In the event of cardiac or respiratory ARREST: Perform Intubation/Mechanical Ventilation Yes   In the event of cardiac or respiratory ARREST: Use NIPPV/BiPAp only if indicated No   In the event of cardiac or respiratory ARREST: Administer ACLS medications if indicated No   In the event of cardiac or respiratory ARREST: Perform Defibrillation or Cardioversion if indicated No   Comments Continue trach and ventilator. No resusitation if heart stops.      10/28/17 1711    Code Status History    Date Active Date Inactive Code Status Order ID Comments User Context   09/30/2017 1945 10/28/2017 1711 Full Code [id number]  [name], MD Inpatient   08/20/2017 1713 08/24/2017 1743 Full Code [id number]  Dustin Flock, MD Inpatient   06/29/2017 0625 07/02/2017 2008 Full Code [id number]  Harrie Foreman, MD Inpatient   02/23/2017 0241 02/26/2017 1634 Full Code [id number]  Harrie Foreman, MD Inpatient   01/01/2017 1144 01/04/2017 1354 Full Code [id number]  Bettey Costa, MD Inpatient   05/22/2016 2155 05/27/2016 1645 Full Code [id number]  Fritzi Mandes, MD Inpatient   04/23/2016 2020 04/27/2016 1848 Full Code [id number]  Hower, Aaron Mose, MD ED   02/07/2016 1922 02/10/2016 1835 Full Code [id number]  Gladstone Lighter, MD Inpatient  11/29/2015 0519 12/01/2015 1609 Full Code [id number]  Harrie Foreman, MD Inpatient   11/05/2015 0911 11/07/2015 1606 Full Code [id number]  Hillary Bow, MD ED   10/24/2014 1522 10/27/2014 1630 Full Code [id number]  Hillary Bow, MD Inpatient     Family Communication: As per critical care specialist. Disposition Plan: No plan at this point.  Antibiotics:   Cipro  Time spent: 25 minutes, case discussed with nursing staff.  Fritzi Mandes  [employer]

## 2017-12-15 NOTE — Plan of Care (Signed)
Neuro: Pt A&OX4, neuro exam WNL. Will continue to monitor.    Respiratory: Pt remains on ventilator at this time. Trach care done with no complications.   Cardiovascular: Pt afebrile, BP soft, but WNL and HR normal.   GI/GU: Pt voiding in external catheter, on continuous rube feeds and tolerating at this time. Two BM's recordred.    Skin: Skin intact with no s/s of skin breakdown at this time. Pt remains a Q2hr turn.     Pain: Pt with reports of neck pain, tylenol given with adequate relief reported.   Events: NO acute events throughout shift. Pts plan of care to continue with current regimen, Family updated and no further questions at this time.

## 2017-12-15 NOTE — Progress Notes (Signed)
No changes overall.  Remains on home vent.  Cognition intact.  No new complaints.  Vitals:   12/15/17 0800 12/15/17 0900 12/15/17 1000 12/15/17 1100  BP: 96/60 118/76 114/69 115/79  Pulse: 87 83 80 82  Resp: 16 16 17 19   Temp: 98.7 F (37.1 C)     TempSrc: Oral     SpO2: 94% 96% 96% 97%  Weight:      Height:       NAD, cognition intact HEENT: NCAT, sclerae white No JVD No wheezes or other adventitious sounds RRR, no M Abdomen soft, NT, NABS Bilateral upper and lower extremity edema NSC Diffusely weak without focal neurologic deficits   BMP Latest Ref Rng & Units 12/14/2017 12/10/2017 12/07/2017  Glucose 70 - 99 mg/dL 99 113(H) 99  BUN 6 - 20 mg/dL 27(H) 19 21(H)  Creatinine 0.44 - 1.00 mg/dL 0.41(L) 0.36(L) <0.30(L)  Sodium 135 - 145 mmol/L 141 140 141  Potassium 3.5 - 5.1 mmol/L 4.3 4.0 4.5  Chloride 98 - 111 mmol/L 105 103 101  CO2 22 - 32 mmol/L 32 33(H) 32  Calcium 8.9 - 10.3 mg/dL 8.7(L) 8.4(L) 8.6(L)    CBC Latest Ref Rng & Units 12/14/2017 12/11/2017 12/10/2017  WBC 3.6 - 11.0 K/uL 6.6 10.9 9.4  Hemoglobin 12.0 - 16.0 g/dL 8.6(L) 8.5(L) 7.5(L)  Hematocrit 35.0 - 47.0 % 26.5(L) 25.9(L) 23.8(L)  Platelets 150 - 440 K/uL 300 360 314    ANTIMICROBIALS: Vanc 06/16 >> 06/18 Cefepime 06/16 >> 06/18 Cefazolin 06/18 >> 06/27 Vanc 07/03 >> 07/04 Pip-tazo 07/03 >> 07/05 Cipro 07/05 >> 07/08  CXR: No new film   IMP: Very severe COPD Prolonged mechanical ventilation Failure to wean Tracheostomy tube status MSSA HCAP/VAP  - Fully treated ICU acquired anemia G-tube status Anxiety/panic disorder, well controlled Major depression, controlled Severe protein calorie malnutrition Anasarca, slowly improving  PLAN/REC: Cont vent support Cont to wean in PSV <> ATC as tolerated Cont vent bundle Daily SBT if/when meets criteria  Continue nebulized steroids and bronchodilators Monitor temp, WBC count Micro and abx as above - DC Cipro today DVT px: enoxaparin Monitor CBC  intermittently Transfuse per usual guidelines  Cont scheduled clonazepam, quetiapine  Continue paroxetine per psychiatry Decrease valproic acid Continue PRN alprazolam  No family @ bedside to update  Merton Border, MD PCCM service Mobile [phone] Pager [phone] 12/15/2017 2:48 PM

## 2017-12-15 NOTE — Progress Notes (Signed)
Continue on home vent without incident. Tolerating tube feeding. Denies pain and discomfort. Plan for patient to go home with family and home health. Still in progress.

## 2017-12-16 ENCOUNTER — Inpatient Hospital Stay: Payer: Medicaid Other

## 2017-12-16 ENCOUNTER — Telehealth: Payer: Self-pay | Admitting: *Deleted

## 2017-12-16 DIAGNOSIS — R0603 Acute respiratory distress: Secondary | ICD-10-CM

## 2017-12-16 LAB — COMPREHENSIVE METABOLIC PANEL
ALT: 8 U/L (ref 0–44)
AST: 18 U/L (ref 15–41)
Albumin: 2.2 g/dL — ABNORMAL LOW (ref 3.5–5.0)
Alkaline Phosphatase: 41 U/L (ref 38–126)
Anion gap: 8 (ref 5–15)
BUN: 22 mg/dL — AB (ref 6–20)
CHLORIDE: 103 mmol/L (ref 98–111)
CO2: 27 mmol/L (ref 22–32)
CREATININE: 0.39 mg/dL — AB (ref 0.44–1.00)
Calcium: 8.7 mg/dL — ABNORMAL LOW (ref 8.9–10.3)
GFR calc Af Amer: >60 mL/min (ref >60)
GLUCOSE: 103 mg/dL — AB (ref 70–99)
Potassium: 4.1 mmol/L (ref 3.5–5.1)
SODIUM: 138 mmol/L (ref 135–145)
Total Bilirubin: 0.4 mg/dL (ref 0.3–1.2)
Total Protein: 6.5 g/dL (ref 6.5–8.1)

## 2017-12-16 LAB — CBC
HCT: 25.2 % — ABNORMAL LOW (ref 35.0–47.0)
Hemoglobin: 8.6 g/dL — ABNORMAL LOW (ref 12.0–16.0)
MCH: 28.4 pg (ref 26.0–34.0)
MCHC: 33.9 g/dL (ref 32.0–36.0)
MCV: 83.7 fL (ref 80.0–100.0)
PLATELETS: 281 10*3/uL (ref 150–440)
RBC: 3.01 MIL/uL — AB (ref 3.80–5.20)
RDW: 19.1 % — ABNORMAL HIGH (ref 11.5–14.5)
WBC: 5.9 10*3/uL (ref 3.6–11.0)

## 2017-12-16 LAB — GLUCOSE, CAPILLARY: Glucose-Capillary: 80 mg/dL (ref 70–99)

## 2017-12-16 LAB — MAGNESIUM: Magnesium: 1.8 mg/dL (ref 1.7–2.4)

## 2017-12-16 LAB — PHOSPHORUS: Phosphorus: 5.1 mg/dL — ABNORMAL HIGH (ref 2.5–4.6)

## 2017-12-16 MED ORDER — OXYCODONE-ACETAMINOPHEN 5-325 MG PO TABS
1.0000 | ORAL_TABLET | Freq: Three times a day (TID) | ORAL | Status: DC | PRN
  Administered 2017-12-16 – 2017-12-31 (×30): 1
  Filled 2017-12-16 (×30): qty 1

## 2017-12-16 MED ORDER — OXYCODONE-ACETAMINOPHEN 5-325 MG PO TABS: 1.0000 | ORAL_TABLET | Freq: Three times a day (TID) | ORAL | Status: DC | PRN

## 2017-12-16 MED ORDER — FUROSEMIDE 10 MG/ML IJ SOLN
20.0000 mg | Freq: Once | INTRAMUSCULAR | Status: AC
  Administered 2017-12-16: 20 mg via INTRAVENOUS
  Filled 2017-12-16: qty 2

## 2017-12-16 NOTE — Progress Notes (Signed)
CRITICAL CARE NOTE  CC  follow up respiratory failure secondary to COPD  SUBJECTIVE 57 year old female with COPD, CAD, HTN, anxiety, CHF, and polysubstance abuse who remains on ventilator in the ICU while waiting for placement. Patient is on her home vent that she will be using once transferred. Patient doing well. Ciprofloxacin for ventilator pneumonia completed yesterday. She communicates to me that she is having pain down both of her legs.    BP 118/72   Pulse 83   Temp 98.9 F (37.2 C) (Oral)   Resp 18   Ht 4\' 9"  (1.448 m)   Wt 54.9 kg (121 lb 0.5 oz)   LMP 03/06/2005 (Approximate)   SpO2 96%   BMI 26.19 kg/m    REVIEW OF SYSTEMS  PATIENT IS UNABLE TO PROVIDE COMPLETE REVIEW OF SYSTEM S DUE TO RESPIRATORY STATUS; ON VENTILATOR   PHYSICAL EXAMINATION:  GENERAL:resting in bed on ventilator, no acute distress HEAD: Normocephalic, atraumatic.  EYES: Pupils equal, round, reactive to light.  No scleral icterus.  MOUTH: Moist mucosal membrane. NECK: Supple. No thyromegaly. No nodules. No JVD.  PULMONARY: +wheezing, no rales CARDIOVASCULAR: S1 and S2. Regular rate and rhythm. No murmurs, rubs, or gallops.  GASTROINTESTINAL: Soft, nontender, -distended. No masses. Positive bowel sounds. No hepatosplenomegaly.  MUSCULOSKELETAL: No swelling, clubbing, or edema.  NEUROLOGIC: alert and oriented, talking over ventilator. Poor muscle strength SKIN:intact,warm,dry  ASSESSMENT AND PLAN SYNOPSIS  57 year old female with severe COPD exacerbation with prolonged mechanical ventilation with tracheostomy.   Severe Hypoxic and Hypercapnic Respiratory Failure -continue pressure support ventilation on tracheostomy -multiple weaning attempts to trach collar have been unsuccessful -continue Bronchodilator Therapy: Duoneb -SBT if able to wean  PAIN CONTROL: -patient complaining of bilateral leg pain today; on 25 mcg fentanyl patch -Will add Percocet q8h PRN for pain  NEUROLOGY - alert  and oriented: doing well -continue clonazepam and quetiapine. PRN xanax for anxiety -continue paroxetine per psychiatry for major depression  GASTROINTESTINAL: -G tube status, doing well with feeds  CARDIAC -ICU monitoring -no issues at this time  ENDOCRINOLOGY: -blood glucose normal, continue to monitor  NEPHROLOGY: -electrolytes stable  ID -Ventilator associated pneumonia: treated and resolved. Ciprofloxacin completed yesterday.  -CXR 12/16/2017: shows improvement in atelectasis with no evidence of consolidation -Continue CBC and fever monitoring  DVT: Lovenox 40mg  QD SUP: Pepcid 20mg  QD  PT/OT/Speech consult requested  TRANSFUSIONS AS NEEDED MONITOR FSBS ASSESS the need for LABS as needed  Critical Care Time devoted to patient care services described in this note is  20 minutes.   Overall, patient is critically ill, prognosis is guarded.  Patient with Multiorgan failure and at high risk for cardiac arrest and death.

## 2017-12-16 NOTE — Care Management (Addendum)
Late note entry from 12/12/17 PM:  RNCM received fax from [facility] for PDN to be signed by caregivers.  Both daughters notified and copy sent to both requesting their signature. Both agree to meeting RNCM at hospital to sign form.  12/16/17 RNCM received another fax for MD signature to sign for home nursing which will be requested this AM. Update at 1244P: Daughter Liliana Cline said that she and [name] would be up today to sign papers RNCM left on patient's chart.  RN updated.

## 2017-12-16 NOTE — Progress Notes (Signed)
SLP Cancellation Note  Patient Details Name: CECEILIA CEPHUS MRN: [id number] DOB: 06/22/1960   Cancelled treatment:       Reason Eval/Treat Not Completed: Patient not medically ready(chart reviewed; consulted NSG re: pt's status).  Pt is on vent support full time w/ cuff inflated per NSG report, MD/chart notes. Pt/family are receiving education on discharging home on the portable vent this week. Pt has a baseline of Severe COPD, prolonged mechanical ventilation w/ failure to wean. Using a speaking valve on the vent would require an in-line speaking valve which is not available at this Acute care facility(typically in a more long term setting of Rehab). In addition, using an in-line valve requires adjustment of pt's vent settings when placing such. This would not be recommended at this time as pt is learning her new vent and vent setting support in preparation for discharge home. A PMV eval for use of an in-line speaking valve could be considered once she has achieved successful, independent management of her baseline vent needs in the home. Recommend consideration of this in ~4 weeks post discharge home; there would need to be a strong component of family involvement and education as well.  This was discussed w/ pt's Nurse.     Orinda Kenner, MS, CCC-SLP Annah Jasko 12/16/2017, 5:25 PM

## 2017-12-16 NOTE — Telephone Encounter (Signed)
-----   Message from Ace Gins sent at 12/16/2017  9:29 AM EDT ----- Regarding: Marshell Garfinkel dropped off papers Marykay Lex Marshell Garfinkel came down and brought some paperwork that needs to be signed by one of our providers.  Placed in nurse box.  Please fax back to number provided or she can come get.  Thanks, Caryl Pina

## 2017-12-16 NOTE — Clinical Social Work Note (Signed)
No changes in plans for home discharge when time. Shela Leff MSW,LCSW [phone]

## 2017-12-16 NOTE — Progress Notes (Signed)
Patient has pulled apart her trach from her neck connection x three this shift, and RN cannot gauge if she does not want it, or knows staff will come into the room when she does it. She is communicating her needs and informed RN that she was in pain and she received Fentanyl 12.5 mg x 1. Do not know plan of care at this time, still waiting for home health acceptance for discharge home.

## 2017-12-16 NOTE — Progress Notes (Signed)
Patient ID: Michelle [name], female     [employer] PROGRESS NOTE  Michelle [name] VFI:[id number] DOB: 10/16/1960 DOA: 09/30/2017 PCP: [facility]  HPI/Subjective:  She remains on the portable vent that she supposed to use at home.  No other acute events overnight.  Objective: Vitals:   12/16/17 1300 12/16/17 1400  BP: 111/69 118/72  Pulse: 75 77  Resp: 16 16  Temp:    SpO2: 99% 97%    Filed Weights   12/02/17 0247 12/03/17 0400 12/11/17 0436  Weight: 58.1 kg (128 lb 1.4 oz) 56.8 kg (125 lb 3.5 oz) 54.9 kg (121 lb 0.5 oz)    ROS: Review of Systems  Unable to perform ROS: Acuity of condition  Respiratory: Negative for shortness of breath.   Cardiovascular: Negative for chest pain.  Gastrointestinal: Negative for abdominal pain.  Musculoskeletal: Negative for joint pain.  Neurological: Negative for headaches.   Exam: Physical Exam  HENT:  Nose: No mucosal edema.  Eyes: Pupils are equal, round, and reactive to light. Conjunctivae and lids are normal.  Neck: No JVD present. Carotid bruit is not present. No edema present. No thyroid mass and no thyromegaly present.  Cardiovascular: S1 normal and S2 normal. Exam reveals no gallop.  No murmur heard. Pulses:      Dorsalis pedis pulses are 2+ on the right side, and 2+ on the left side.  Respiratory: No accessory muscle usage. She has decreased breath sounds in the right lower field and the left lower field. She has no wheezes. She has no rhonchi. She has no rales.  GI: Soft. Bowel sounds are normal. There is no tenderness.  Musculoskeletal:       Right ankle: She exhibits no swelling.       Left ankle: She exhibits no swelling.  Lymphadenopathy:    She has no cervical adenopathy.  Neurological: She is alert.  Skin: Skin is warm. No rash noted. Nails show no clubbing.  Psychiatric: She has a normal mood and affect.      Data Reviewed: Basic Metabolic Panel: Recent Labs  Lab 12/10/17 0450  12/14/17 0436 12/16/17 0547  NA 140 141 138  K 4.0 4.3 4.1  CL 103 105 103  CO2 33* 32 27  GLUCOSE 113* 99 103*  BUN 19 27* 22*  CREATININE 0.36* 0.41* 0.39*  CALCIUM 8.4* 8.7* 8.7*  MG 1.9  --  1.8  PHOS 4.8*  --  5.1*   Liver Function Tests: Recent Labs  Lab 12/16/17 0547  AST 18  ALT 8  ALKPHOS 41  BILITOT 0.4  PROT 6.5  ALBUMIN 2.2*    Critical care team to teach the family how to do the event.  CBC: Recent Labs  Lab 12/10/17 0450 12/11/17 0429 12/14/17 0436 12/16/17 0547  WBC 9.4 10.9 6.6 5.9  NEUTROABS  --  7.3*  --   --   HGB 7.5* 8.5* 8.6* 8.6*  HCT 23.8* 25.9* 26.5* 25.2*  MCV 85.0 84.1 84.0 83.7  PLT 314 360 300 281   BNP (last 3 results) Recent Labs    06/29/17 0412 08/20/17 1337 09/30/17 1705  BNP 38.0 33.0 66.0    CBG: Recent Labs  Lab 12/13/17 2120 12/14/17 0030 12/14/17 0725 12/14/17 2327 12/16/17 0013  GLUCAP 87 86 87 108* 80    No results found for this or any previous visit (from the past 240 hour(s)).    Scheduled Meds: . budesonide (PULMICORT) nebulizer solution  0.5 mg Nebulization  BID  . chlorhexidine gluconate (MEDLINE KIT)  15 mL Mouth Rinse BID  . clonazePAM  0.5 mg Per Tube Daily  . clonazePAM  1 mg Per Tube QHS  . enoxaparin (LOVENOX) injection  40 mg Subcutaneous Q24H  . famotidine  20 mg Per Tube Daily  . feeding supplement (PRO-STAT SUGAR FREE 64)  30 mL Per Tube BID  . fentaNYL  25 mcg Transdermal Q72H  . free water  50 mL Per Tube Q8H  . ipratropium-albuterol  3 mL Nebulization Q6H  . mouth rinse  15 mL Mouth Rinse 10 times per day  . multivitamin  15 mL Per Tube Daily  . nystatin  5 mL Oral QID  . nystatin cream   Topical BID  . PARoxetine  20 mg Per Tube BID  . potassium chloride  20 mEq Per Tube BID  . QUEtiapine  25 mg Per Tube QHS  . saccharomyces boulardii  250 mg Oral BID  . senna-docusate  1 tablet Per Tube BID  . sodium chloride flush  10-40 mL Intracatheter Q12H   Continuous Infusions: .  feeding supplement (JEVITY 1.5 CAL/FIBER) 1,000 mL (12/16/17 0014)    Assessment/Plan:  1. Ventilator associated pneumonia - pt. Has finished treatment with Ancef, Cipro now.  - afebrile and hemodynamically stable.   2. Acute on chronic hypoxic respiratory failure status post tracheostomy on portable ventilator.  - cont. Supportive care and vent weaning as per Pulmonary.   3. Critical illness polyneuropathy.  Patient still very weak.  Starting to move her arms a little bit more  - cont. PT 4. Severe anxiety on Xanax Klonopin and Depakote 5. COPD on nebulizer treatments 6. Stage II decubitus ulcer on sacrum.  Local wound care 7. Anemia of chronic disease.  Hemoglobin 8.2. 8. Nutrition.  Continue tube feeding as per dietary.   Code Status:     Code Status Orders  (From admission, onward)        Start     Ordered   10/28/17 1707  Limited resuscitation (code)  Continuous    Question Answer Comment  In the event of cardiac or respiratory ARREST: Initiate Code Blue, Call Rapid Response No   In the event of cardiac or respiratory ARREST: Perform CPR No   In the event of cardiac or respiratory ARREST: Perform Intubation/Mechanical Ventilation Yes   In the event of cardiac or respiratory ARREST: Use NIPPV/BiPAp only if indicated No   In the event of cardiac or respiratory ARREST: Administer ACLS medications if indicated No   In the event of cardiac or respiratory ARREST: Perform Defibrillation or Cardioversion if indicated No   Comments Continue trach and ventilator. No resusitation if heart stops.      10/28/17 1711     Family Communication: As per critical care specialist. Disposition Plan: No plan at this point.  Antibiotics:   Cipro  Time spent: 25 minutes, case discussed with nursing staff.  [facility]

## 2017-12-17 LAB — THYROID PANEL WITH TSH
Free Thyroxine Index: 1.9 (ref 1.2–4.9)
T3 UPTAKE RATIO: 25 % (ref 24–39)
T4 TOTAL: 7.6 ug/dL (ref 4.5–12.0)
TSH: 0.742 u[IU]/mL (ref 0.450–4.500)

## 2017-12-17 LAB — GLUCOSE, CAPILLARY: Glucose-Capillary: 87 mg/dL (ref 70–99)

## 2017-12-17 NOTE — Care Management (Signed)
Patient suffers from acute on chronic respiratory failure and COPD, Stage II decubitus ulcer on sacrum and has trouble breathing at night when head is elevated less 30 degrees.  Bed wedges do not provide enough elevation to resolve breathing issues. Respiratory failure, aspiration risks cause patient to require frequent and immediate changes in body position  changes in body which cannot be achieved with a normal bed.

## 2017-12-17 NOTE — Progress Notes (Signed)
Rested on and off throughout shift.  Alert and follows commands watching tv at this time.  No respiratory distress on trilogy.

## 2017-12-17 NOTE — Evaluation (Signed)
Occupational Therapy Evaluation Patient Details Name: Michelle [name] MRN: [id number] DOB: [birth year] Today's Date: 12/17/2017    History of Present Illness 57 y/o F who came to [facility] in late April with severe SOB with COPD exacerbation.  Chest x-ray was clear, patient was intubated and admitted to the ICU.  Trach tube placed 5/16.  Pt demonstrated seizure like activity on 5/23, MRI negative, EEG abnormal but no epileptiform activity.  Pt's PMH includes cocaine abuse, COPD, CAD, hepatitis C, CHF.  Multiple attempts to wean off vent have all been unsuccessful   Clinical Impression   Pt seen for OT/PT co-evaluation this date 2/2 medical complexity/patient safety/to address functional transfers. Currently pt demonstrates impairments in strength globally, cardiopulmonary status, sitting balance, activity tolerance, and pain requiring min to max assist for bed mobility (+1 to manage lines/leads/tubes). R hand with mild edema, positioned with pillows at end of session. Pt tolerated sitting EOB with assist for sitting balance for 5 minutes, O2 remaining in mid to high 90's and HR 92-115. BP elevated, RN notified. Pt very fatigued afterwards. Instructed pt t/o in pursed lip breathing to support breath control and minimize anxiety with good follow through with occasional cues to utilize. Pt would benefit from skilled OT to address noted impairments and functional limitations (see below for any additional details) in order to maximize safety and independence while minimizing falls risk and caregiver burden. Per chart review, family intends to take pt home.    Follow Up Recommendations  SNF    Equipment Recommendations  3 in 1 bedside commode    Recommendations for Other Services       Precautions / Restrictions Precautions Precautions: Fall;Other (comment) Precaution Comments: trach vent Restrictions Weight Bearing Restrictions: No      Mobility Bed Mobility Overal bed mobility: Needs  Assistance Bed Mobility: Supine to Sit;Sit to Supine     Supine to sit: +2 for physical assistance;Max assist Sit to supine: Max assist;+2 for physical assistance   General bed mobility comments: Pt initially leaning heavily back and to the L, once situated with bed flat and UEs WBing she was able to hold at times with only min assist for balance but would quickly begin to lean back heavily and needed great assist.  Pt was able to maintain sitting with assist X 5 minutes and was very fatigued with this effort.  O2 and HR remained relatively stable t/o the effort  Transfers                 General transfer comment: Not appropriate for getting to standing this date    Balance Overall balance assessment: Needs assistance Sitting-balance support: Bilateral upper extremity supported Sitting balance-Leahy Scale: Poor Sitting balance - Comments: Pt with variable amounts of posterior lean, but always needed at least min assist to stay upright, often much more Postural control: (had difficulty trying to extend spine and sitting fully upri)                                 ADL either performed or assessed with clinical judgement   ADL Overall ADL's : Needs assistance/impaired Eating/Feeding: NPO Eating/Feeding Details (indicate cue type and reason): feeding tube currently Grooming: Bed level;Wash/dry face;Brushing hair;Minimal assistance   Upper Body Bathing: Bed level;Maximal assistance   Lower Body Bathing: Bed level;Total assistance   Upper Body Dressing : Bed level;Maximal assistance   Lower Body Dressing: Bed level;Total assistance  Toilet Transfer Details (indicate cue type and reason): unsafe to attempt                 Vision Baseline Vision/History: No visual deficits Patient Visual Report: No change from baseline Vision Assessment?: No apparent visual deficits     Perception     Praxis      Pertinent Vitals/Pain Pain Assessment:  Faces Faces Pain Scale: Hurts even more Pain Location: grimacing with even light palpation of LEs Pain Descriptors / Indicators: Grimacing Pain Intervention(s): Limited activity within patient's tolerance;Monitored during session;Repositioned     Hand Dominance Right   Extremity/Trunk Assessment Upper Extremity Assessment Upper Extremity Assessment: Generalized weakness(grossly weak, 3-/5 bilaterally, mild edema noted in R hand)   Lower Extremity Assessment Lower Extremity Assessment: Defer to PT evaluation;Generalized weakness RLE Deficits / Details: Pt unable to hold b/l ankles in neutral (did show some slight AAROM strength in L ankle DF).  Pt with pain response with any movement in LEs, R LE grossly 2/5, L LE grossly 3-/5.  Very little AROM b/l with any movement       Communication Communication Communication: Tracheostomy(pt able to do very minimal voicing over trach)   Cognition Arousal/Alertness: Awake/alert Behavior During Therapy: WFL for tasks assessed/performed Overall Cognitive Status: Difficult to assess                                 General Comments: slow to respond, limited responses, able to make some needs known   General Comments       Exercises Other Exercises Other Exercises: pt instructed in pursed lip breathing to support breath control and minimize SOB/anxiety with mobility efforts   Shoulder Instructions      Home Living Family/patient expects to be discharged to:: Private residence(apparently family against putting her in rehab) Living Arrangements: Spouse/significant other Available Help at Discharge: Family;Available 24 hours/day Type of Home: Mobile home Home Access: Stairs to enter Entrance Stairs-Number of Steps: 5 Entrance Stairs-Rails: Right;Left;Can reach both Home Layout: One level     Bathroom Shower/Tub: Teacher, early years/pre: Standard     Home Equipment: Cane - single point;Bedside commode    Additional Comments: pt very limited communication, info gathered from previous notes      Prior Functioning/Environment Level of Independence: Needs assistance  Gait / Transfers Assistance Needed: per notes independent w/ transfers but very limited in w/ community distances due to SOB. ADL's / Homemaking Assistance Needed: per notes daughters assist w/ ADLs, grocery shopping, and house cleaning   Comments: With PT evaluation on 06/30/17 the pt was able to ambulate 40 ft without AD with min guard assist.        OT Problem List: Decreased strength;Decreased knowledge of use of DME or AE;Increased edema;Decreased range of motion;Decreased activity tolerance;Cardiopulmonary status limiting activity;Impaired UE functional use;Pain;Impaired balance (sitting and/or standing)      OT Treatment/Interventions: Self-care/ADL training;Balance training;Therapeutic exercise;Therapeutic activities;Energy conservation;DME and/or AE instruction;Patient/family education    OT Goals(Current goals can be found in the care plan section) Acute Rehab OT Goals Patient Stated Goal: unable to state OT Goal Formulation: With patient Time For Goal Achievement: 12/31/17 Potential to Achieve Goals: Fair ADL Goals Pt Will Perform Grooming: with set-up;with supervision;sitting(tolerate sitting EOB for 10 min to perform grooming w/ S) Pt/caregiver will Perform Home Exercise Program: Increased strength;Both right and left upper extremity;With theraband;With written HEP provided  OT Frequency: Min 1X/week  Barriers to D/C:            Co-evaluation PT/OT/SLP Co-Evaluation/Treatment: Yes Reason for Co-Treatment: Complexity of the patient's impairments (multi-system involvement);For patient/therapist safety;To address functional/ADL transfers PT goals addressed during session: Mobility/safety with mobility;Balance OT goals addressed during session: ADL's and self-care      AM-PAC PT "6 Clicks" Daily Activity      Outcome Measure Help from another person eating meals?: Total Help from another person taking care of personal grooming?: A Little Help from another person toileting, which includes using toliet, bedpan, or urinal?: Total Help from another person bathing (including washing, rinsing, drying)?: Total Help from another person to put on and taking off regular upper body clothing?: A Lot Help from another person to put on and taking off regular lower body clothing?: Total 6 Click Score: 9   End of Session Nurse Communication: Other (comment)(vitals during session, pain)  Activity Tolerance: Patient tolerated treatment well Patient left: in bed;with call bell/phone within reach;with bed alarm set  OT Visit Diagnosis: Other abnormalities of gait and mobility (R26.89);Muscle weakness (generalized) (M62.81);Pain Pain - Right/Left: Right(both) Pain - part of body: Ankle and joints of foot;Leg                Time: 6213-0865 OT Time Calculation (min): 31 min Charges:  OT General Charges $OT Visit: 1 Visit OT Evaluation $OT Eval High Complexity: 1 High  Jeni Salles, MPH, MS, OTR/L ascom [phone] 12/17/17, 2:55 PM

## 2017-12-17 NOTE — Evaluation (Signed)
Physical Therapy Re-Evaluation Patient Details Name: Michelle [name] MRN: [id number] DOB: [birth year] Today's Date: 12/17/2017   History of Present Illness  57 y/o F who came to [facility] in late April with severe SOB with COPD exacerbation.  Chest x-ray was clear, patient was intubated and admitted to the ICU.  Trach tube placed 5/16.  Pt demonstrated seizure like activity on 5/23, MRI negative, EEG abnormal but no epileptiform activity.  Pt's PMH includes cocaine abuse, COPD, CAD, hepatitis C, CHF.  Multiple attempts to wean off vent have all been unsuccessful  Clinical Impression  Pt more awake than any previous PT attempt and was able to participate with some LE AAROM as well as some actual mobility. She was able to tolerate sitting at EOB (with min <> max assist) and surprisingly did not have any major changes in vitals (O2 remained in the mid/high 90s and HR generally in the 100s, we did get a few high BP readings - nursing notified).  Pt, by far, did more today than she has been able to do in months and was very tired after the prolonged EOB sitting effort.  She had profound weakness with U&LEs with LEs generally more limited and UEs and R generally more limited than L, however all AROM was highly limited.  Co-treat with OT, PT student present to assist as well. Pt will apparently not have a lot of PT available after d/c and plan is to train family as much as possible on how to work with pt once at home.     Follow Up Recommendations SNF(family apparently plans on taking her home regardless)    Equipment Recommendations  (per continued progress, unable to even attempt standing yet)    Recommendations for Other Services       Precautions / Restrictions Precautions Precautions: Fall(trach vent) Restrictions Weight Bearing Restrictions: No      Mobility  Bed Mobility Overal bed mobility: Needs Assistance Bed Mobility: Supine to Sit;Sit to Supine     Supine to sit: +2 for physical  assistance;Max assist Sit to supine: Max assist;+2 for physical assistance   General bed mobility comments: Pt initially leaning heavily back and to the L, once situated with bed flat and UEs WBing she was able to hold at times with only min assist for balance but would quickly begin to lean back heavily and needed great assist.  Pt was able to maintain sitting with assist X 5 minutes and was very fatigued with this effort.  O2 and HR remained relatively stable t/o the effort  Transfers                 General transfer comment: Not appropriate for getting to standing this date  Ambulation/Gait                Stairs            Wheelchair Mobility    Modified Rankin (Stroke Patients Only)       Balance Overall balance assessment: Needs assistance Sitting-balance support: Bilateral upper extremity supported Sitting balance-Leahy Scale: Poor Sitting balance - Comments: Pt with variable amounts of posterior lean, but always needed at least min assist to stay upright, often much more Postural control: (had difficulty trying to extend spine and sitting fully upri)                                   Pertinent Vitals/Pain Pain  Assessment: Faces Faces Pain Scale: Hurts even more Pain Location: grimacing with even light palpation of LEs    Home Living Family/patient expects to be discharged to:: Private residence(apparently family against putting her in rehab) Living Arrangements: Spouse/significant other Available Help at Discharge: Family;Available 24 hours/day Type of Home: Mobile home Home Access: Stairs to enter Entrance Stairs-Rails: Right;Left;Can reach both Entrance Stairs-Number of Steps: 5 Home Layout: One level Home Equipment: Cane - single point;Bedside commode Additional Comments: pt very limited communication, info gathered from previous notes    Prior Function Level of Independence: Needs assistance   Gait / Transfers Assistance  Needed: per notes independent w/ transfers but very limited in w/ community distances due to SOB.  ADL's / Homemaking Assistance Needed: per notes daughters assist w/ ADLs, grocery shopping, and house cleaning  Comments: With PT evaluation on 06/30/17 the pt was able to ambulate 40 ft without AD with min guard assist.     Hand Dominance        Extremity/Trunk Assessment   Upper Extremity Assessment Upper Extremity Assessment: Defer to OT evaluation    Lower Extremity Assessment Lower Extremity Assessment: Generalized weakness RLE Deficits / Details: Pt unable to hold b/l ankles in neutral (did show some slight AAROM strength in L ankle DF).  Pt with pain response with any movement in LEs, R LE grossly 2/5, L LE grossly 3-/5.  Very little AROM b/l with any movement       Communication   Communication: Tracheostomy(pt able to do very minimal voicing over trach)  Cognition Arousal/Alertness: (much more alert than previous PT attempts) Behavior During Therapy: WFL for tasks assessed/performed Overall Cognitive Status: Difficult to assess                                 General Comments: slow to respond, limited responses, able to make some needs known      General Comments      Exercises     Assessment/Plan    PT Assessment Patient needs continued PT services  PT Problem List Decreased strength;Decreased range of motion;Decreased activity tolerance;Decreased balance;Decreased mobility;Decreased coordination;Decreased knowledge of use of DME;Decreased safety awareness;Decreased knowledge of precautions;Cardiopulmonary status limiting activity       PT Treatment Interventions DME instruction;Gait training;Stair training;Functional mobility training;Therapeutic activities;Therapeutic exercise;Balance training;Neuromuscular re-education;Patient/family education;Manual techniques;Wheelchair mobility training;Modalities;Cognitive remediation    PT Goals (Current  goals can be found in the Care Plan section)  Acute Rehab PT Goals Patient Stated Goal: unable to state PT Goal Formulation: Patient unable to participate in goal setting Time For Goal Achievement: 12/31/17 Potential to Achieve Goals: Fair    Frequency Min 2X/week   Barriers to discharge        Co-evaluation PT/OT/SLP Co-Evaluation/Treatment: Yes Reason for Co-Treatment: Complexity of the patient's impairments (multi-system involvement)           AM-PAC PT "6 Clicks" Daily Activity  Outcome Measure Difficulty turning over in bed (including adjusting bedclothes, sheets and blankets)?: Unable Difficulty moving from lying on back to sitting on the side of the bed? : Unable Difficulty sitting down on and standing up from a chair with arms (e.g., wheelchair, bedside commode, etc,.)?: Unable Help needed moving to and from a bed to chair (including a wheelchair)?: Total Help needed walking in hospital room?: Total Help needed climbing 3-5 steps with a railing? : Total 6 Click Score: 6    End of Session Equipment Utilized During  Treatment: Oxygen(vent) Activity Tolerance: Patient limited by fatigue Patient left: with call bell/phone within reach;in bed Nurse Communication: Mobility status(2 BPs 200s/100s in sitting and 214/114 in supine post tx) PT Visit Diagnosis: Muscle weakness (generalized) (M62.81);Difficulty in walking, not elsewhere classified (R26.2)    Time: 8466-5993 PT Time Calculation (min) (ACUTE ONLY): 30 min   Charges:   PT Evaluation $PT Re-evaluation: 1 Re-eval     PT G Codes:        Kreg Shropshire, DPT 12/17/2017, 1:47 PM

## 2017-12-17 NOTE — Progress Notes (Signed)
CRITICAL CARE NOTE  CC  follow up respiratory failure secondary to COPD  SUBJECTIVE 57 year old female with COPD, CAD, HTN, anxiety, CHF, and polysubstance abuse who remains on ventilator in the ICU while waiting for placement. Patient is on her home vent that she will be using once transferred. Patient doing well. Ciprofloxacin for ventilator pneumonia completed yesterday. She communicates to me that she is having pain down both of her legs.   12/17/17 Doing well  No major even overnight  Awaiting bed Case discussed in MDR rounds   BP 129/68   Pulse 91   Temp 99.3 F (37.4 C) (Oral)   Resp 17   Ht 4\' 9"  (1.448 m)   Wt 121 lb 0.5 oz (54.9 kg)   LMP 03/06/2005 (Approximate)   SpO2 97%   BMI 26.19 kg/m    REVIEW OF SYSTEMS  PATIENT IS UNABLE TO PROVIDE COMPLETE REVIEW OF SYSTEM S DUE TO RESPIRATORY STATUS; ON VENTILATOR   PHYSICAL EXAMINATION:  GENERAL:resting in bed on ventilator, no acute distress HEAD: Normocephalic, atraumatic.  EYES: Pupils equal, round, reactive to light.  No scleral icterus.  MOUTH: Moist mucosal membrane. NECK: Supple. No thyromegaly. No nodules. No JVD.  PULMONARY: +wheezing, no rales CARDIOVASCULAR: S1 and S2. Regular rate and rhythm. No murmurs, rubs, or gallops.  GASTROINTESTINAL: Soft, nontender, -distended. No masses. Positive bowel sounds. No hepatosplenomegaly.  MUSCULOSKELETAL: No swelling, clubbing, or edema.  NEUROLOGIC: alert and oriented, talking over ventilator. Poor muscle strength SKIN:intact,warm,dry  ASSESSMENT AND PLAN  SYNOPSIS  57 year old female with severe COPD exacerbation with prolonged mechanical ventilation with tracheostomy.   IMP: Very severe COPD Prolonged mechanical ventilation Failure to wean Tracheostomy tube status MSSA HCAP/VAP - Fully treated ICU acquired anemia G-tube status Anxiety/panic disorder, well controlled Major depression, controlled Severe protein calorie malnutrition Anasarca, slowly  improving  -Severe Hypoxic and Hypercapnic Respiratory Failure -continue pressure support ventilation on tracheostomy -multiple weaning attempts to trach collar have been unsuccessful -continue Bronchodilator Therapy: Duoneb -SBT if able to wean  PAIN CONTROL: -patient complaining of bilateral leg pain today; on 25 mcg fentanyl patch -Will add Percocet q8h PRN for pain  NEUROLOGY - alert and oriented: doing well -continue clonazepam and quetiapine. PRN xanax for anxiety -continue paroxetine per psychiatry for major depression  GASTROINTESTINAL: -G tube status, doing well with feeds  CARDIAC -ICU monitoring -no issues at this time  ENDOCRINOLOGY: -blood glucose normal, continue to monitor  NEPHROLOGY: -electrolytes stable  ID -Ventilator associated pneumonia: treated and resolved. Ciprofloxacin completed yesterday.  -CXR 12/16/2017: shows improvement in atelectasis with no evidence of consolidation -Continue CBC and fever monitoring  DVT: Lovenox 40mg  QD SUP: Pepcid 20mg  QD  PT/OT/Speech consult requested  TRANSFUSIONS AS NEEDED MONITOR FSBS ASSESS the need for LABS as needed    Skin/Wound: chronic changes   Electrolytes: Replace electrolytes per ICU electrolyte replacement protocol.   IVF: none  Nutrition: Feeding at goal   Prophylaxis: DVT Prophylaxis with lovenox,. GI Prophylaxis.   Restraints: none  PT/OT eval and treat. OOB when appropriate.   Lines/Tubes:  purvic foley  No central line.  ADVANCE DIRECTIVE:Full code  FAMILY DISCUSSION:spoke with patient   Quality Care: PPI, DVT prophylaxis, HOB elevated, Infection control all reviewed and addressed.  Events and notes from last 24 hours reviewed. Care plan discussed on multidisciplinary rounds  CC TIME:33 min    Old records reviewed discussed results and management plan with patient  Images personally reviewed and results and labs reviewed and discussed  with patient.  All medication  reviewed and adjusted  Further management depending on test results and work up as outlined above.    Lahoma Rocker, M.D

## 2017-12-17 NOTE — Progress Notes (Signed)
Nutrition Follow-up  DOCUMENTATION CODES:   Non-severe (moderate) malnutrition in context of chronic illness  INTERVENTION:  Continue Jevity 1.5 Cal at 35 mL/hr (840 mL goal daily volume) + Pro-Stat 30 mL BID per G-tube. Provides 1460 kcal, 84 grams of protein, 18.5 grams of fiber, 638 mL H2O daily.  Continue liquid MVI daily per tube.  With free water flush 50 mL Q8hrs patient is receiving a total of 788 mL H2O including water in tube feeding. She will also receive approximately 180 mL fluid with Pro-Stat provision.  NUTRITION DIAGNOSIS:   Moderate Malnutrition related to chronic illness(COPD, CHF, hepatitis C, cocaine abuse) as evidenced by mild fat depletion, moderate fat depletion, mild muscle depletion, moderate muscle depletion.  Ongoing - addressing with TF regimen.  GOAL:   Provide needs based on ASPEN/SCCM guidelines  Met with Tf regimen.  MONITOR:   Vent status, Labs, Weight trends, TF tolerance, I & O's  REASON FOR ASSESSMENT:   Ventilator    ASSESSMENT:   58 year old female with PMHx of emphysema, COPD, HTN, hypercholesterolemia, hx pneumothorax s/p chest tube insertion, CAD s/p cardiac catheterization, cocaine abuse, tobacco abuse, asthma, CHF, anxiety, hepatitis C admitted with acute exacerbation of COPD, acute on chronic respiratory failure requiring intubation 4/29, PNA, cocaine intoxication, AKI due to renal hypoperfusion.   -Patient is s/p tracheostomy tube placement on 5/16. OGT was replaced with NGT. -Patient s/p placement of 20 Fr. Pull-through gastrostomy tube on 5/30 by IR. -Per [facility] RN documentationon 6/12the device related pressure injury at trach has now healed as no open wounds were found. -Plan is for patient to discharge home on vent this week.  Patient remains ventilated through tracheostomy collar. She is on her home ventilator (Trilogy). Abdomen feels soft. Still with moderate-severe pitting edema.  Access: 20 Fr. G-tube placed on 5/30  by IR  TF: pt tolerating Jevity 1.5 Cal at 35 mL/hr + Pro-Stat 30 mL BID; per pump history patient received 800 mL tube feeds in the past 24 hrs (95.2% goal daily volume)  Patient is currently intubated on ventilator support Ve: 9.6 L/min Temp (24hrs), Avg:98.7 F (37.1 C), Min:98.3 F (36.8 C), Max:99.3 F (37.4 C)  Propofol: N/A  Medications reviewed and include: clonazepam, famotidine, fentanyl patch, free water flush 50 mL Q8hrs, liquid MVI daily per tube, potassium chloride 20 mEq BID, Seroquel, Florastor 250 mg BID, senna-docusate.  Labs reviewed: CBG 87-108. On 7/15 BUN 22, Creatinine 0.39.  I/O: 1400 mL UOP yesterday (1.1 mL/kg/hr)  Weight trend: 54.9 kg on 7/10; -1.9 kg from 7/2  Discussed with RN and on rounds.  Diet Order:   Diet Order    None      EDUCATION NEEDS:   No education needs have been identified at this time  Skin:  Skin Assessment: Skin Integrity Issues: Skin Integrity Issues:: Stage II, Other (Comment) Stage II: medial sacrum (epithelialized/intact) Incisions: N/A Other: MSAD to groin  Last BM:  12/16/2017 - medium type 6  Height:   Ht Readings from Last 1 Encounters:  10/01/17 4' 9"  (1.448 m)    Weight:   Wt Readings from Last 1 Encounters:  12/11/17 121 lb 0.5 oz (54.9 kg)    Ideal Body Weight:  43.2 kg  BMI:  Body mass index is 26.19 kg/m.  Estimated Nutritional Needs:   Kcal:  1150-1370 (25-30 kcal/kg)  Protein:  70-93 grams (1.5-2 grams/kg)  Fluid:  1.4-1.6 L/day (30-35 mL/kg)  Willey Blade, MS, RD, LDN Office: [phone] Pager: [phone] After Hours/Weekend  Pager: [phone]

## 2017-12-17 NOTE — Care Management (Addendum)
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  These have been faxed to [facility] with Burgess Estelle [phone].  Forms for home nursing are still at [facility] waiting for Dr Zoila Shutter signature when he returns to work on Wednesday- he is aware that forms have been dropped off however RNCM will remind him 12/18/17.  RNCM spoke with Charlie at [facility] to confirm receipt of forms. Update at 1130A: [name] work schedule is M-F 3P-11P and every other weekend. [place] weekly schedule pending.  Eduard Clos has been updated with [place] information. Per Cecille Amsterdam will submit authorization to Medicaid today and update RNCM when authorization approved. Update 4:30P: received notification from [name] that she works M-F 10P-7A and inconsistently on weekends. RNCM has forwarded this information to Cullen with [employer]. Patient daughter states that patient will not need a bedside commode- she has one at her residence.  O2 sats will be needed at earliest 48 hours prior to discharge.

## 2017-12-17 NOTE — Progress Notes (Signed)
Patient ID: Michelle [name], female     [facility] PROGRESS NOTE  Michelle [name] DPO:[id number] DOB: 1961-01-20 DOA: 09/30/2017 PCP: [facility], [facility]  HPI/Subjective:  No acute events overnight, patient remains on the trilogy home ventilator and tolerating it well.  Objective: Vitals:   12/17/17 1347 12/17/17 1400  BP:  125/70  Pulse:  93  Resp:  (!) 21  Temp:    SpO2: 95% 96%    Filed Weights   12/02/17 0247 12/03/17 0400 12/11/17 0436  Weight: 58.1 kg (128 lb 1.4 oz) 56.8 kg (125 lb 3.5 oz) 54.9 kg (121 lb 0.5 oz)    ROS: Review of Systems  Unable to perform ROS: Acuity of condition  Respiratory: Negative for shortness of breath.   Cardiovascular: Negative for chest pain.  Gastrointestinal: Negative for abdominal pain.  Musculoskeletal: Negative for joint pain.  Neurological: Negative for headaches.   Exam: Physical Exam  HENT:  Nose: No mucosal edema.  Eyes: Pupils are equal, round, and reactive to light. Conjunctivae and lids are normal.  Neck: No JVD present. Carotid bruit is not present. No edema present. No thyroid mass and no thyromegaly present.  Cardiovascular: S1 normal and S2 normal. Exam reveals no gallop.  No murmur heard. Pulses:      Dorsalis pedis pulses are 2+ on the right side, and 2+ on the left side.  Respiratory: No accessory muscle usage. She has decreased breath sounds in the right lower field and the left lower field. She has no wheezes. She has no rhonchi. She has no rales.  GI: Soft. Bowel sounds are normal. There is no tenderness.  Musculoskeletal:       Right ankle: She exhibits no swelling.       Left ankle: She exhibits no swelling.  Lymphadenopathy:    She has no cervical adenopathy.  Neurological: She is alert.  Skin: Skin is warm. No rash noted. Nails show no clubbing.  Psychiatric: She has a normal mood and affect.      Data Reviewed: Basic Metabolic Panel: Recent Labs  Lab 12/14/17 0436 12/16/17 0547   NA 141 138  K 4.3 4.1  CL 105 103  CO2 32 27  GLUCOSE 99 103*  BUN 27* 22*  CREATININE 0.41* 0.39*  CALCIUM 8.7* 8.7*  MG  --  1.8  PHOS  --  5.1*   Liver Function Tests: Recent Labs  Lab 12/16/17 0547  AST 18  ALT 8  ALKPHOS 41  BILITOT 0.4  PROT 6.5  ALBUMIN 2.2*    Critical care team to teach the family how to do the event.  CBC: Recent Labs  Lab 12/11/17 0429 12/14/17 0436 12/16/17 0547  WBC 10.9 6.6 5.9  NEUTROABS 7.3*  --   --   HGB 8.5* 8.6* 8.6*  HCT 25.9* 26.5* 25.2*  MCV 84.1 84.0 83.7  PLT 360 300 281   BNP (last 3 results) Recent Labs    06/29/17 0412 08/20/17 1337 09/30/17 1705  BNP 38.0 33.0 66.0    CBG: Recent Labs  Lab 12/13/17 2120 12/14/17 0030 12/14/17 0725 12/14/17 2327 12/16/17 0013  GLUCAP 87 86 87 108* 80    No results found for this or any previous visit (from the past 240 hour(s)).    Scheduled Meds: . budesonide (PULMICORT) nebulizer solution  0.5 mg Nebulization BID  . chlorhexidine gluconate (MEDLINE KIT)  15 mL Mouth Rinse BID  . clonazePAM  0.5 mg Per Tube Daily  . clonazePAM  1 mg Per Tube QHS  . enoxaparin (LOVENOX) injection  40 mg Subcutaneous Q24H  . famotidine  20 mg Per Tube Daily  . feeding supplement (PRO-STAT SUGAR FREE 64)  30 mL Per Tube BID  . fentaNYL  25 mcg Transdermal Q72H  . free water  50 mL Per Tube Q8H  . ipratropium-albuterol  3 mL Nebulization Q6H  . mouth rinse  15 mL Mouth Rinse 10 times per day  . multivitamin  15 mL Per Tube Daily  . nystatin  5 mL Oral QID  . nystatin cream   Topical BID  . PARoxetine  20 mg Per Tube BID  . potassium chloride  20 mEq Per Tube BID  . QUEtiapine  25 mg Per Tube QHS  . saccharomyces boulardii  250 mg Oral BID  . senna-docusate  1 tablet Per Tube BID  . sodium chloride flush  10-40 mL Intracatheter Q12H   Continuous Infusions: . feeding supplement (JEVITY 1.5 CAL/FIBER) 1,000 mL (12/17/17 7972)    Assessment/Plan:  1. Ventilator associated  pneumonia - pt. Has finished treatment with Ancef, Cipro now.  - afebrile and hemodynamically stable.   2. Acute on chronic hypoxic respiratory failure status post tracheostomy on portable ventilator.  - cont. Supportive care and vent weaning as per Pulmonary.   3. Critical illness polyneuropathy.  Patient still very weak.  Starting to move her arms a little bit more - cont. PT  4. Severe anxiety on Xanax Klonopin and Depakote  5. COPD on nebulizer treatments  6. Stage II decubitus ulcer on sacrum.  Local wound care  7. Anemia of chronic disease.  Hemoglobin 8.2.  8. Nutrition.  Continue tube feeding as per dietary.     Code Status:     Code Status Orders  (From admission, onward)        Start     Ordered   10/28/17 1707  Limited resuscitation (code)  Continuous    Question Answer Comment  In the event of cardiac or respiratory ARREST: Initiate Code Blue, Call Rapid Response No   In the event of cardiac or respiratory ARREST: Perform CPR No   In the event of cardiac or respiratory ARREST: Perform Intubation/Mechanical Ventilation Yes   In the event of cardiac or respiratory ARREST: Use NIPPV/BiPAp only if indicated No   In the event of cardiac or respiratory ARREST: Administer ACLS medications if indicated No   In the event of cardiac or respiratory ARREST: Perform Defibrillation or Cardioversion if indicated No   Comments Continue trach and ventilator. No resusitation if heart stops.      10/28/17 1711     Family Communication: As per critical care specialist. Disposition Plan: No plan at this point.  Antibiotics:   Cipro  Time spent: 25 minutes, case discussed with nursing staff.  [facility]

## 2017-12-18 NOTE — Care Management (Addendum)
Hospital PT agrees to work with patient's daughters on home exercise plan. RNCM spoke with [name] and she said she would be here Thursday before 4PM (~ 12:10). PT updated. [name] has not responded (probably asleep as she works 3rd shift).

## 2017-12-18 NOTE — Progress Notes (Signed)
[place] [facility] [place] NAME: Michelle [name]    MR#:  [id number]  DATE OF BIRTH:  [birth year]  SUBJECTIVE:   No acute events overnight, remains on her home trilogy ventilator.  Awake and following some simple commands.  Afebrile, hemodynamically stable.  REVIEW OF SYSTEMS:    Review of Systems  Unable to perform ROS: Intubated    Nutrition: Tube feeds Tolerating Diet: Yes Tolerating PT: Bedbound   DRUG ALLERGIES:   Allergies  Allergen Reactions  . Carvedilol Other (See Comments)    Pt gets "sick" when on medication    VITALS:  Blood pressure 107/65, pulse 98, temperature 99 F (37.2 C), temperature source Oral, resp. rate (!) 21, height 4\' 9"  (1.448 m), weight 51.2 kg (112 lb 14 oz), last menstrual period 03/06/2005, SpO2 98 %.  PHYSICAL EXAMINATION:   Physical Exam  GENERAL:  57 y.o.-year-old patient lying in bed in no acute distress.  EYES: Pupils equal, round, reactive to light and accommodation. No scleral icterus. Extraocular muscles intact.  HEENT: Head atraumatic, normocephalic.  + Trach in place and pt. Connect to Vent.  NECK:  Supple, no jugular venous distention. No thyroid enlargement, no tenderness.  LUNGS: Normal breath sounds bilaterally, no wheezing, rales, + upper airway rhonchi. No use of accessory muscles of respiration.  CARDIOVASCULAR: S1, S2 normal. No murmurs, rubs, or gallops.  ABDOMEN: Soft, nontender, nondistended. Bowel sounds present. No organomegaly or mass. + PEG tube in place.  EXTREMITIES: No cyanosis, clubbing or edema b/l.    NEUROLOGIC: Cranial nerves II through XII are intact. No focal Motor or sensory deficits b/l.  Globally weak and follow simple commands.  PSYCHIATRIC: The patient is alert and oriented x 1.  SKIN: No obvious rash, lesion, or ulcer.    LABORATORY PANEL:   CBC Recent Labs  Lab 12/16/17 0547  WBC 5.9  HGB 8.6*  HCT 25.2*  PLT 281    ------------------------------------------------------------------------------------------------------------------  Chemistries  Recent Labs  Lab 12/16/17 0547  NA 138  K 4.1  CL 103  CO2 27  GLUCOSE 103*  BUN 22*  CREATININE 0.39*  CALCIUM 8.7*  MG 1.8  AST 18  ALT 8  ALKPHOS 41  BILITOT 0.4   ------------------------------------------------------------------------------------------------------------------  Cardiac Enzymes No results for input(s): TROPONINI in the last 168 hours. ------------------------------------------------------------------------------------------------------------------  RADIOLOGY:  No results found.   ASSESSMENT AND PLAN:    1. Ventilator associated pneumonia - pt. Has finished treatment with Ancef, Cipro now.  - afebrile and hemodynamically stable.   2. Acute on chronic hypoxic respiratory failure status post tracheostomy on portable ventilator.  - cont. Supportive care and vent weaning as per Pulmonary.   3. Critical illness polyneuropathy.  Patient still very weak.   - cont. PT as tolerated.   4. Severe anxiety - cont.  Xanax Klonopin, Paxil, Seroquel.  5. COPD - cont. Acute exacerbation.  - cont. Duonebs.   6. Stage II decubitus ulcer on sacrum.  Local wound care  7. Anemia of chronic disease.  Hemoglobin 8.6 as of 7/15  8. Nutrition.  Continue tube feeding as per dietary.  9. Chronic Pain - cont.  Fentanyl Patch.   Awaiting Placement.    All the records are reviewed and case discussed with Care Management/Social Worker. Management plans discussed with the patient, family and they are in agreement.  CODE STATUS: Partial Code  DVT Prophylaxis: Lovenox  TOTAL TIME TAKING CARE OF THIS PATIENT: 25 minutes.   POSSIBLE D/C  unclear   Henreitta Leber M.D on 12/18/2017 at 3:43 PM  Between 7am to 6pm - Pager - [phone]  After 6pm go to [email or web address] - Patent attorney  [facility]  Office  [phone]  CC: Primary care physician; Center, [facility]

## 2017-12-18 NOTE — Progress Notes (Signed)
Patient in no apparent distress.   Report given to receiving RN, Tiffany with no further questions.

## 2017-12-18 NOTE — Care Management (Addendum)
Message sent to Dr. Mortimer Fries for Signature on [facility] [employer]'s request form for private duty nursing which needs to be sent to [facility] asap for services- waiting for response so that RNCM can take to his office for review. Update 1044AM: signature obtained from Dr. Mortimer Fries and faxed to [facility] with Burgess Estelle. Per Eduard Clos, still waiting on Medicaid auth for home services.

## 2017-12-18 NOTE — Progress Notes (Signed)
CRITICAL CARE NOTE  CC  follow up respiratory failure secondary to COPD  SUBJECTIVE 57 year old female with COPD, CAD, HTN, anxiety, CHF, and polysubstance abuse who remains on ventilator in the ICU while waiting for placement. Patient is on her home vent that she will be using once transferred. Patient doing well. Ciprofloxacin for ventilator pneumonia completed yesterday. She communicates to me that she is having pain down both of her legs.   12/18/17 Doing well  No major even overnight <some low grade fever noted but nothing more than 100.4> Awaiting bed Case discussed in MDR rounds   BP 102/71   Pulse 84   Temp 99.5 F (37.5 C) (Oral)   Resp 18   Ht 4\' 9"  (1.448 m)   Wt 112 lb 14 oz (51.2 kg)   LMP 03/06/2005 (Approximate)   SpO2 95%   BMI 24.43 kg/m    REVIEW OF SYSTEMS  PATIENT IS UNABLE TO PROVIDE COMPLETE REVIEW OF SYSTEM S DUE TO RESPIRATORY STATUS; ON VENTILATOR   PHYSICAL EXAMINATION:  GENERAL:resting in bed on ventilator, no acute distress HEAD: Normocephalic, atraumatic.  EYES: Pupils equal, round, reactive to light.  No scleral icterus.  MOUTH: Moist mucosal membrane. NECK: Supple. No thyromegaly. No nodules. No JVD.  PULMONARY: +wheezing, no rales CARDIOVASCULAR: S1 and S2. Regular rate and rhythm. No murmurs, rubs, or gallops.  GASTROINTESTINAL: Soft, nontender, -distended. No masses. Positive bowel sounds. No hepatosplenomegaly.  MUSCULOSKELETAL: No swelling, clubbing, or edema.  NEUROLOGIC: alert and oriented, talking over ventilator. Poor muscle strength SKIN:intact,warm,dry  ASSESSMENT AND PLAN  SYNOPSIS  57 year old female with severe COPD exacerbation with prolonged mechanical ventilation with tracheostomy.   IMP: Very severe COPD Prolonged mechanical ventilation Failure to wean Tracheostomy tube status MSSA HCAP/VAP - Fully treated ICU acquired anemia G-tube status Anxiety/panic disorder, well controlled Major depression,  controlled Severe protein calorie malnutrition Anasarca, slowly improving  -Severe Hypoxic and Hypercapnic Respiratory Failure -continue pressure support ventilation on tracheostomy-- Plan for possible dc in next few days -multiple weaning attempts to trach collar have been unsuccessful -continue Bronchodilator Therapy: Duoneb -SBT if able to wean -- Monitor for fever curve  PAIN CONTROL: -patient complaining of bilateral leg pain  on 25 mcg fentanyl patch -On Percocet q8h PRN for pain  NEUROLOGY - alert and oriented: doing well -continue clonazepam and quetiapine. PRN xanax for anxiety -continue paroxetine per psychiatry for major depression  GASTROINTESTINAL: -G tube status, doing well with feeds  CARDIAC -ICU monitoring -no issues at this time  ENDOCRINOLOGY: -blood glucose normal, continue to monitor  NEPHROLOGY: -electrolytes stable  ID -Ventilator associated pneumonia: treated and resolved. Ciprofloxacin completed yesterday.  -CXR 12/16/2017: shows improvement in atelectasis with no evidence of consolidation -Continue CBC and fever monitoring  DVT: Lovenox 40mg  QD SUP: Pepcid 20mg  QD  PT/OT/Speech consult requested  TRANSFUSIONS AS NEEDED MONITOR FSBS ASSESS the need for LABS as needed    Skin/Wound: chronic changes   Electrolytes: Replace electrolytes per ICU electrolyte replacement protocol.   IVF: none  Nutrition: Feeding at goal   Prophylaxis: DVT Prophylaxis with lovenox,. GI Prophylaxis.   Restraints: none  PT/OT eval and treat. OOB when appropriate.   Lines/Tubes:  purvic foley  No central line.  ADVANCE DIRECTIVE:Full code  FAMILY DISCUSSION:spoke with patient   Quality Care: PPI, DVT prophylaxis, HOB elevated, Infection control all reviewed and addressed.  Events and notes from last 24 hours reviewed. Care plan discussed on multidisciplinary rounds  CC TIME:33 min  Old records reviewed discussed results and management plan  with patient  Images personally reviewed and results and labs reviewed and discussed with patient.  All medication reviewed and adjusted  Further management depending on test results and work up as outlined above.    Lahoma Rocker, M.D

## 2017-12-18 NOTE — Progress Notes (Signed)
Physical Therapy Treatment Patient Details Name: Michelle [name] MRN: [id number] DOB: March 16, 1961 Today's Date: 12/18/2017    History of Present Illness 56 y/o F who came to [facility] in late April with severe SOB with COPD exacerbation.  Chest x-ray was clear, patient was intubated and admitted to the ICU.  Trach tube placed 5/16.  Pt demonstrated seizure like activity on 5/23, MRI negative, EEG abnormal but no epileptiform activity.  Pt's PMH includes cocaine abuse, COPD, CAD, hepatitis C, CHF.  Multiple attempts to wean off vent have all been unsuccessful   PT Comments    Michelle [name] was more alert today and agreeable to therapy.  Main purpose of session today was to educate Michelle [name] Michelle [name]) in [place] with Michelle [name] performing therapeutic exercises documented below to RUE and RLE under direction of this therapist.  Pt tolerated therapeutic exercises well and handout provided to Michelle [name], Michelle [name].     Follow Up Recommendations  SNF(family apparently plans on taking her home regardless)     Equipment Recommendations  Wheelchair (measurements PT);Wheelchair cushion (measurements PT)(likely will need lift, WC)    Recommendations for Other Services       Precautions / Restrictions Precautions Precautions: Fall(trach vent) Precaution Comments: trach vent Restrictions Weight Bearing Restrictions: No    Mobility  Bed Mobility               General bed mobility comments: Deferred as main goal of session was to provide Michelle [name] with HEP  Transfers                    Ambulation/Gait                 Stairs             Wheelchair Mobility    Modified Rankin (Stroke Patients Only)       Balance                                            Cognition Arousal/Alertness: Awake/alert Behavior During Therapy: WFL for tasks assessed/performed Overall Cognitive Status: Difficult to assess                                 General  Comments: slow to respond, limited responses, able to make some needs known      Exercises Other Exercises Other Exercises: BUE PROM shoulder ER and IR Other Exercises: BLE PROM hip ER and IR Other Exercises: [facility] BUE: shoulder F (limited L shoulder F to avoid movement of trach tube), elbow F and E, wrist F and E, composite finger F and E Other Exercises: [facility] BLE: Hip F, hip Abd, heel slides Other Exercises: [facility]: Bil DF and PF x10 each direction (pt with hypersensitivity so did not provide assist for this one)    General Comments General comments (skin integrity, edema, etc.): Michelle [name], Michelle [name] educated in [place] and provided with handout.  [place] performed all therapeutic exercises below to RUE and RLE under direction of this therapist.       Pertinent Vitals/Pain Pain Assessment: Faces Faces Pain Scale: Hurts even more Pain Location: grimacing with LE exercises and reports BLE sharp throbbing pain Pain Descriptors / Indicators: Grimacing Pain Intervention(s): Limited activity within patient's tolerance;Monitored during session    Home Living  Prior Function            PT Goals (current goals can now be found in the care plan section) Acute Rehab PT Goals Patient Stated Goal: unable to state PT Goal Formulation: With patient/family Time For Goal Achievement: 12/31/17 Potential to Achieve Goals: Fair Progress towards PT goals: Progressing toward goals    Frequency    Min 2X/week      PT Plan Current plan remains appropriate    Co-evaluation PT/OT/SLP Co-Evaluation/Treatment: Yes            AM-PAC PT "6 Clicks" Daily Activity  Outcome Measure  Difficulty turning over in bed (including adjusting bedclothes, sheets and blankets)?: Unable Difficulty moving from lying on back to sitting on the side of the bed? : Unable Difficulty sitting down on and standing up from a chair with arms (e.g., wheelchair, bedside commode, etc,.)?:  Unable Help needed moving to and from a bed to chair (including a wheelchair)?: Total Help needed walking in hospital room?: Total Help needed climbing 3-5 steps with a railing? : Total 6 Click Score: 6    End of Session Equipment Utilized During Treatment: Oxygen(vent) Activity Tolerance: Patient limited by fatigue Patient left: with call bell/phone within reach;in bed;with family/visitor present Nurse Communication: Mobility status(BP stable this session (101/73)) PT Visit Diagnosis: Muscle weakness (generalized) (M62.81);Difficulty in walking, not elsewhere classified (R26.2)     Time: 6270-3500 PT Time Calculation (min) (ACUTE ONLY): 33 min  Charges:  $Therapeutic Exercise: 23-37 mins                    G Codes:       [name] PT, DPT 12/18/2017, 4:24 PM

## 2017-12-19 ENCOUNTER — Inpatient Hospital Stay (HOSPITAL_COMMUNITY)
Admit: 2017-12-19 | Discharge: 2017-12-19 | Disposition: A | Discharge status: Home / Self Care | Payer: Medicaid Other | Attending: Pulmonary Disease | Admitting: Pulmonary Disease

## 2017-12-19 DIAGNOSIS — I340 Nonrheumatic mitral (valve) insufficiency: Secondary | ICD-10-CM

## 2017-12-19 LAB — BLOOD GAS, ARTERIAL
ACID-BASE EXCESS: 4.4 mmol/L — AB (ref 0.0–2.0)
Bicarbonate: 29.2 mmol/L — ABNORMAL HIGH (ref 20.0–28.0)
FIO2: 0.4
O2 Saturation: 93.8 %
PATIENT TEMPERATURE: 37
PH ART: 7.43 (ref 7.350–7.450)
pCO2 arterial: 44 mmHg (ref 32.0–48.0)
pO2, Arterial: 68 mmHg — ABNORMAL LOW (ref 83.0–108.0)

## 2017-12-19 LAB — BASIC METABOLIC PANEL
ANION GAP: 6 (ref 5–15)
BUN: 23 mg/dL — ABNORMAL HIGH (ref 6–20)
CHLORIDE: 103 mmol/L (ref 98–111)
CO2: 27 mmol/L (ref 22–32)
Calcium: 8.6 mg/dL — ABNORMAL LOW (ref 8.9–10.3)
Creatinine, Ser: 0.42 mg/dL — ABNORMAL LOW (ref 0.44–1.00)
GFR calc Af Amer: >60 mL/min (ref >60)
GFR calc non Af Amer: >60 mL/min (ref >60)
Glucose, Bld: 109 mg/dL — ABNORMAL HIGH (ref 70–99)
Potassium: 4 mmol/L (ref 3.5–5.1)
SODIUM: 136 mmol/L (ref 135–145)

## 2017-12-19 LAB — CBC WITH DIFFERENTIAL/PLATELET
BASOS ABS: 0.2 10*3/uL — AB (ref 0–0.1)
Basophils Relative: 2 %
EOS ABS: 0.1 10*3/uL (ref 0–0.7)
Eosinophils Relative: 1 %
HEMATOCRIT: 26.5 % — AB (ref 35.0–47.0)
HEMOGLOBIN: 8.8 g/dL — AB (ref 12.0–16.0)
Lymphocytes Relative: 13 %
Lymphs Abs: 1.1 10*3/uL (ref 1.0–3.6)
MCH: 27.8 pg (ref 26.0–34.0)
MCHC: 33 g/dL (ref 32.0–36.0)
MCV: 84.1 fL (ref 80.0–100.0)
Monocytes Absolute: 1.2 10*3/uL — ABNORMAL HIGH (ref 0.2–0.9)
Monocytes Relative: 14 %
NEUTROS ABS: 6.2 10*3/uL (ref 1.4–6.5)
Neutrophils Relative %: 70 %
Platelets: 282 10*3/uL (ref 150–440)
RBC: 3.15 MIL/uL — AB (ref 3.80–5.20)
RDW: 19.6 % — ABNORMAL HIGH (ref 11.5–14.5)
WBC: 8.8 10*3/uL (ref 3.6–11.0)

## 2017-12-19 LAB — ECHOCARDIOGRAM COMPLETE
HEIGHTINCHES: 57 in
WEIGHTICAEL: 1806.01 [oz_av]

## 2017-12-19 MED ORDER — OXYCODONE-ACETAMINOPHEN 5-325 MG PO TABS
1.0000 | ORAL_TABLET | ORAL | Status: AC
  Administered 2017-12-19: 1 via ORAL
  Filled 2017-12-19: qty 1

## 2017-12-19 MED ORDER — PNEUMOCOCCAL VAC POLYVALENT 25 MCG/0.5ML IJ INJ: 0.5000 mL | INJECTION | INTRAMUSCULAR | Status: DC

## 2017-12-19 NOTE — Progress Notes (Signed)
CRITICAL CARE NOTE  CC  follow up respiratory failure secondary to COPD  SUBJECTIVE 57 year old female with respiratory failure secondary to COPD who remains on trilogy home ventilator in the ICU. Patient had tentative discharge scheduled for today, however case management is waiting on medicare approval. Possible discharge for 7/25 at the earliest.   Patient alert during examination and asking for pain medication.   SIGNIFICANT EVENTS None, afebrile   BP (!) 105/58 (BP Location: Left Arm)   Pulse 83   Temp 99.9 F (37.7 C) (Oral)   Resp 16   Ht 4\' 9"  (1.448 m)   Wt 51.2 kg (112 lb 14 oz)   LMP 03/06/2005 (Approximate)   SpO2 96%   BMI 24.43 kg/m    REVIEW OF SYSTEMS  PATIENT IS UNABLE TO PROVIDE COMPLETE REVIEW OF SYSTEM S DUE TO TRACHEOSTOMY STATUS   PHYSICAL EXAMINATION:  GENERAL: no acute distress, resting comfortably on ventilator HEAD: Normocephalic, atraumatic.  EYES: Pupils equal, round, reactive to light.  No scleral icterus.  MOUTH: Moist mucosal membrane. NECK: Supple. No thyromegaly. No nodules. No JVD.  PULMONARY: +wheezing, no rhonchi CARDIOVASCULAR: S1 and S2. Regular rate and rhythm. No murmurs, rubs, or gallops.  GASTROINTESTINAL: Soft, nontender, -distended. No masses. Positive bowel sounds. No hepatosplenomegaly.  MUSCULOSKELETAL: No swelling, clubbing, or edema. Poor muscle strength NEUROLOGIC: alert and oriented, talking over ventilator.  SKIN:intact,warm,dry  ASSESSMENT AND PLAN SYNOPSIS  57 year old female with severe COPD exacerbation with prolonged mechanical ventilation s/p tracheostomy. Patient continuing to do well and is progressing with PT/OT. Medicare has not provided clearance for patient to go home, earliest provided date is the 25th per case management.  Severe Hypoxic and Hypercapnic Respiratory Failure -continue home vent -multiple weaning attempts to trach collar have been unsuccessful -as patient will be here for another  week, will attempt trach collar today -continue Bronchodilator Therapy: Duoneb   NEUROLOGY -alert and oriented. -continue clonazepam and quetiapine. PRN xanax for anxiety -continue paxil per psychiatry for depression  NEPHROLOGY -electrolytes stable  ENDOCRINOLOGY: -blood glucose normal, continue to monitor  GI: G-tube status, doing well with feeds  CARDIAC ICU monitoring -no concerns  ID -ventilator associated pneumonia resolved -concern over low grade fever yesterday, has resolved.   DVT: lovenox GI: pepcid  PT/OT working with patient  TRANSFUSIONS AS NEEDED MONITOR FSBS ASSESS the need for LABS as needed   Critical Care Time devoted to patient care services described in this note is 26 minutes.   Overall, patient is critically ill, prognosis is guarded.  Patient with Multiorgan failure and at high risk for cardiac arrest and death.

## 2017-12-19 NOTE — Progress Notes (Signed)
Patient off of trilogy vent and placed on trach collar. Doing well. ABG shows pH of 7.43, CO2 44, PO2 68.  Plan for trach collar during the day and ventilator at night time. Will discuss with social work to see how this will affect her pending home placement and discharge options.  Ferrel Logan PA-Student

## 2017-12-19 NOTE — Progress Notes (Signed)
[name]    MR#:  [id number]  DATE OF BIRTH:  02-18-1961  SUBJECTIVE:   Remains stable. Off trilogy vent and now on trach collar with plan for trach collar during the day and trilogy vent at night. No acute events overnight or during the day today.  REVIEW OF SYSTEMS:    Review of Systems  Unable to perform ROS: Intubated   Nutrition: Tube feeds Tolerating Diet: Yes Tolerating PT: Bedbound   DRUG ALLERGIES:   Allergies  Allergen Reactions  . Carvedilol Other (See Comments)    Pt gets "sick" when on medication    VITALS:  Blood pressure 120/75, pulse 85, temperature 99.3 F (37.4 C), temperature source Oral, resp. rate 19, height 4\' 9"  (1.448 m), weight 51.2 kg (112 lb 14 oz), last menstrual period 03/06/2005, SpO2 100 %.  PHYSICAL EXAMINATION:   Physical Exam  GENERAL:  57 y.o.-year-old patient lying in bed in no acute distress.  EYES: Pupils equal, round, reactive to light and accommodation. No scleral icterus. Extraocular muscles intact.  HEENT: Head atraumatic, normocephalic.  + Trach in place and patient connected to Vent.  NECK:  Supple, no JVD. No thyroid enlargement, no tenderness.  LUNGS: Normal breath sounds bilaterally, no wheezing, rales, + upper airway rhonchi. No use of accessory muscles of respiration.  CARDIOVASCULAR: S1, S2 normal. No murmurs, rubs, or gallops.  ABDOMEN: Soft, nontender, nondistended. Bowel sounds present. No organomegaly or mass. + PEG tube in place.  EXTREMITIES: No cyanosis, clubbing or edema b/l.    NEUROLOGIC: Cranial nerves II through XII are intact. No focal Motor or sensory deficits b/l.  Globally weak and follow simple commands.  PSYCHIATRIC: The patient is alert and oriented x 1.  SKIN: No obvious rash, lesion, or ulcer.    LABORATORY PANEL:   CBC Recent Labs  Lab 12/19/17 0509  WBC 8.8  HGB 8.8*  HCT 26.5*  PLT 282    ------------------------------------------------------------------------------------------------------------------  Chemistries  Recent Labs  Lab 12/16/17 0547 12/19/17 0509  NA 138 136  K 4.1 4.0  CL 103 103  CO2 27 27  GLUCOSE 103* 109*  BUN 22* 23*  CREATININE 0.39* 0.42*  CALCIUM 8.7* 8.6*  MG 1.8  --   AST 18  --   ALT 8  --   ALKPHOS 41  --   BILITOT 0.4  --    ------------------------------------------------------------------------------------------------------------------  Cardiac Enzymes No results for input(s): TROPONINI in the last 168 hours. ------------------------------------------------------------------------------------------------------------------  RADIOLOGY:  No results found.   ASSESSMENT AND PLAN:    1. Ventilator associated pneumonia - pt. Has finished treatment with Ancef, Cipro now.  - afebrile and hemodynamically stable.   2. Acute on chronic hypoxic respiratory failure status post tracheostomy - now on trach collar during the day and trilogy ventilator at night  - plan to obtain ECHO  3. Critical illness polyneuropathy.  Patient still very weak.   - cont. PT as tolerated.   4. Severe anxiety  - cont.  Xanax Klonopin, Paxil, Seroquel.  5. COPD - cont. Acute exacerbation.  - cont. Duonebs.   6. Stage II decubitus ulcer on sacrum.  Local wound care  7. Anemia of chronic disease.  Hemoglobin 8.6 as of 7/15  8. Nutrition.  Continue tube feeding as per dietary.  9. Chronic Pain  - cont.  Fentanyl Patch - percocet added today   Awaiting Placement.    All the records are reviewed  and case discussed with Care Management/Social Worker. Management plans discussed with the patient, family and they are in agreement.  CODE STATUS: Partial Code  DVT Prophylaxis: Lovenox  TOTAL TIME TAKING CARE OF THIS PATIENT: 25 minutes.   POSSIBLE D/C unclear   Evette Doffing M.D on 12/19/2017 at 7:27 PM  Between 7am to 6pm - Pager -  [phone]  After 6pm go to [email or web address] - [facility]  Office  [phone]  CC: Primary care physician; Center, [facility]

## 2017-12-19 NOTE — Progress Notes (Signed)
*  PRELIMINARY RESULTS* Echocardiogram 2D Echocardiogram has been performed.  Michelle [name] 12/19/2017, 3:10 PM

## 2017-12-19 NOTE — Care Management (Addendum)
RNCM left message for Michelle [name] with [facility] to see if they were ready for patient today at home. RNCM will notify team asap. Last noted, Michelle [name] was still waiting on authorization from [place]. [facility] updated. Will need qualifying O2 sat assessment entered up to 48 hours of discharge.  Update from Michelle [name] that "he said it would be Thursday '25th' at earliest for admission".  Auth has not been received.CSW updated; RN updated. Late note entry from 7/19 at 1:41P: Michelle [name] asked this RNCM how client's daughter's training was going. RNCM provided Michelle [name] RT with [facility]'s contact number to discuss. Overnight training has been done with both daughters per my last discussion with [facility].

## 2017-12-19 NOTE — Progress Notes (Signed)
Pt was taken off the trilogy vent and placed on a .40 aerosol trach collar. Her Sa02 is 100%.

## 2017-12-20 ENCOUNTER — Inpatient Hospital Stay: Payer: Medicaid Other

## 2017-12-20 LAB — COMPREHENSIVE METABOLIC PANEL
ALK PHOS: 47 U/L (ref 38–126)
ALT: 11 U/L (ref 0–44)
AST: 27 U/L (ref 15–41)
Albumin: 2.5 g/dL — ABNORMAL LOW (ref 3.5–5.0)
Anion gap: 5 (ref 5–15)
BILIRUBIN TOTAL: 0.4 mg/dL (ref 0.3–1.2)
BUN: 21 mg/dL — AB (ref 6–20)
CALCIUM: 8.8 mg/dL — AB (ref 8.9–10.3)
CO2: 28 mmol/L (ref 22–32)
CREATININE: 0.35 mg/dL — AB (ref 0.44–1.00)
Chloride: 104 mmol/L (ref 98–111)
Glucose, Bld: 106 mg/dL — ABNORMAL HIGH (ref 70–99)
Potassium: 4 mmol/L (ref 3.5–5.1)
Sodium: 137 mmol/L (ref 135–145)
TOTAL PROTEIN: 6.9 g/dL (ref 6.5–8.1)

## 2017-12-20 LAB — BLOOD GAS, ARTERIAL
Acid-Base Excess: 5.1 mmol/L — ABNORMAL HIGH (ref 0.0–2.0)
Bicarbonate: 30.4 mmol/L — ABNORMAL HIGH (ref 20.0–28.0)
FIO2: 0.36
LHR: 16 {breaths}/min
O2 Saturation: 90.8 %
PEEP: 5 cmH2O
Patient temperature: 36.8
VT: 450 mL
pCO2 arterial: 48 mmHg (ref 32.0–48.0)
pH, Arterial: 7.41 (ref 7.350–7.450)
pO2, Arterial: 59 mmHg — ABNORMAL LOW (ref 83.0–108.0)

## 2017-12-20 LAB — CBC WITH DIFFERENTIAL/PLATELET
BASOS PCT: 0 %
Basophils Absolute: 0 10*3/uL (ref 0–0.1)
Eosinophils Absolute: 0.3 10*3/uL (ref 0–0.7)
Eosinophils Relative: 3 %
HEMATOCRIT: 28.5 % — AB (ref 35.0–47.0)
HEMOGLOBIN: 9 g/dL — AB (ref 12.0–16.0)
LYMPHS PCT: 18 %
Lymphs Abs: 1.4 10*3/uL (ref 1.0–3.6)
MCH: 26.9 pg (ref 26.0–34.0)
MCHC: 31.7 g/dL — AB (ref 32.0–36.0)
MCV: 84.8 fL (ref 80.0–100.0)
MONOS PCT: 17 %
Monocytes Absolute: 1.3 10*3/uL — ABNORMAL HIGH (ref 0.2–0.9)
NEUTROS ABS: 4.6 10*3/uL (ref 1.4–6.5)
NEUTROS PCT: 62 %
Platelets: 313 10*3/uL (ref 150–440)
RBC: 3.36 MIL/uL — ABNORMAL LOW (ref 3.80–5.20)
RDW: 18.8 % — ABNORMAL HIGH (ref 11.5–14.5)
WBC: 7.5 10*3/uL (ref 3.6–11.0)

## 2017-12-20 LAB — MAGNESIUM: MAGNESIUM: 1.6 mg/dL — AB (ref 1.7–2.4)

## 2017-12-20 MED ORDER — FUROSEMIDE 10 MG/ML IJ SOLN
20.0000 mg | Freq: Once | INTRAMUSCULAR | Status: AC
  Administered 2017-12-20: 20 mg via INTRAVENOUS
  Filled 2017-12-20: qty 2

## 2017-12-20 MED ORDER — MAGNESIUM SULFATE 4 GM/100ML IV SOLN
4.0000 g | Freq: Once | INTRAVENOUS | Status: AC
  Administered 2017-12-20: 4 g via INTRAVENOUS
  Filled 2017-12-20: qty 100

## 2017-12-20 MED ORDER — PNEUMOCOCCAL 13-VAL CONJ VACC IM SUSP
0.5000 mL | INTRAMUSCULAR | Status: AC
  Administered 2017-12-22: 0.5 mL via INTRAMUSCULAR
  Filled 2017-12-20: qty 0.5

## 2017-12-20 NOTE — Progress Notes (Signed)
Physical Therapy Treatment Patient Details Name: Michelle [name] MRN: [id number] DOB: 03-Dec-1960 Today's Date: 12/20/2017    History of Present Illness 57 y/o F who came to [facility] in late April with severe SOB with COPD exacerbation.  Chest x-ray was clear, patient was intubated and admitted to the ICU.  Trach tube placed 5/16.  Pt demonstrated seizure like activity on 5/23, MRI negative, EEG abnormal but no epileptiform activity.  Pt's PMH includes cocaine abuse, COPD, CAD, hepatitis C, CHF.  Multiple attempts to wean off vent have all been unsuccessful    PT Comments    Patient with improved alertness, active effort and participation with session this date.  Demonstrates active movement at all extremities; good active effort to assist with all transitional movements.  Able to initiate transition towards unsupported sitting edge of bed, max assist +2; sustaining sitting position x6-7 minutes (with integration of dynamic UE reaching tasks) with min assist +1-2 for safety.  Difficulty achieving foot flat position due to bed height/patient height; may benefit from use of platform step to raise floor surface, offer additional support to patient next session.   Tolerated activity with minimal/no visible anxiety; vitals stable and WFL (no orthostasis) on trach collar throughout session. Patient requesting attempt at standing next session.  Of note, daughter scheduled for additional family training session this date (to address positioning, ROM); did not show, unavailable for duration of session    Follow Up Recommendations  SNF     Equipment Recommendations  Wheelchair (measurements PT);Wheelchair cushion (measurements PT)    Recommendations for Other Services       Precautions / Restrictions Precautions Precautions: Fall Precaution Comments: trach collar  Restrictions Weight Bearing Restrictions: No    Mobility  Bed Mobility Overal bed mobility: Needs Assistance Bed Mobility: Supine to  Sit     Supine to sit: Max assist;+2 for safety/equipment Sit to supine: Max assist;+2 for physical assistance   General bed mobility comments: patient making attempts to actively assist with all extremities and trunk  Transfers                 General transfer comment: unsafe/unable  Ambulation/Gait             General Gait Details: unsafe/unable   Stairs             Wheelchair Mobility    Modified Rankin (Stroke Patients Only)       Balance Overall balance assessment: Needs assistance Sitting-balance support: No upper extremity supported;Feet supported Sitting balance-Leahy Scale: Fair Sitting balance - Comments: forward flexed posture, posterior tilted pelvis (may improve with neutral/flat seating surface?)                                    Cognition Arousal/Alertness: Awake/alert Behavior During Therapy: WFL for tasks assessed/performed Overall Cognitive Status: Within Functional Limits for tasks assessed                                 General Comments: follows commands, actively participates with session, makes needs known.  May benefit from communication board?      Exercises Other Exercises Other Exercises: Bilat LE act assist ROM, 1x5-10 each: ankle pumps, heel slides, hip abduct/adduct. Patient showing active movement/effort at all joints; noted contracture to bilat ankle PF Other Exercises: Unsupported sitting edge of bed x6-7 minutes, min assist +  1-2 for safety.  Patient with good efforts to correct sway/LOB, fair truncal activation in upright.  Mild L lateral lean at times, corrected with dynamic weight shifting activities.  Progressed to include alternate UE dynamic reaching to further promote postural extension and ant/lateral weight shifting.    General Comments        Pertinent Vitals/Pain Pain Assessment: Faces Faces Pain Scale: No hurt    Home Living                      Prior Function             PT Goals (current goals can now be found in the care plan section) Acute Rehab PT Goals Patient Stated Goal: unable to state PT Goal Formulation: With patient Time For Goal Achievement: 12/31/17 Potential to Achieve Goals: Fair Progress towards PT goals: Progressing toward goals    Frequency    Min 2X/week      PT Plan Current plan remains appropriate    Co-evaluation              AM-PAC PT "6 Clicks" Daily Activity  Outcome Measure  Difficulty turning over in bed (including adjusting bedclothes, sheets and blankets)?: Unable Difficulty moving from lying on back to sitting on the side of the bed? : Unable Difficulty sitting down on and standing up from a chair with arms (e.g., wheelchair, bedside commode, etc,.)?: Unable Help needed moving to and from a bed to chair (including a wheelchair)?: Total Help needed walking in hospital room?: Total Help needed climbing 3-5 steps with a railing? : Total 6 Click Score: 6    End of Session Equipment Utilized During Treatment: Oxygen Activity Tolerance: Patient tolerated treatment well Patient left: in bed;with call bell/phone within reach;with bed alarm set Nurse Communication: Mobility status PT Visit Diagnosis: Muscle weakness (generalized) (M62.81);Difficulty in walking, not elsewhere classified (R26.2)     Time: 1241-1316 PT Time Calculation (min) (ACUTE ONLY): 35 min  Charges:  $Therapeutic Exercise: 8-22 mins $Therapeutic Activity: 8-22 mins                    G Codes:      Leopoldo Mazzie H. Owens Shark, PT, DPT, NCS 12/20/17, 2:16 PM [phone]

## 2017-12-20 NOTE — Progress Notes (Signed)
OT Cancellation Note  Patient Details Name: Michelle [name] MRN: [id number] DOB: January 05, 1961   Cancelled Treatment:    Reason Eval/Treat Not Completed: Patient declined, no reason specified;Fatigue/lethargy limiting ability to participate. Upon attempt, pt politely declining 2/2 fatigue. Left there-ex handout and theraband in room for future session. Will re-attempt at later date/time as pt is able to participate.  Jeni Salles, MPH, MS, OTR/L ascom [phone] 12/20/17, 2:45 PM

## 2017-12-20 NOTE — Progress Notes (Signed)
Pt rested for only short times during the night. Co pain x 3 and mediccated for same, then slept for short periods, but watched TV for most of night. Bathed and trach care completed

## 2017-12-20 NOTE — Telephone Encounter (Signed)
Dr. Mortimer Fries signed papers and gave them back to [facility]. Nothing further needed.

## 2017-12-20 NOTE — Progress Notes (Signed)
[facility] [place] NAME: Michelle [name]    MR#:  [id number]  DATE OF BIRTH:  December 26, 1960  SUBJECTIVE:   No events overnight. Doing well with no concerns.  REVIEW OF SYSTEMS:    Review of Systems  Unable to perform ROS: Intubated   Nutrition: Tube feeds Tolerating Diet: Yes Tolerating PT: Bedbound   DRUG ALLERGIES:   Allergies  Allergen Reactions  . Carvedilol Other (See Comments)    Pt gets "sick" when on medication    VITALS:  Blood pressure 108/67, pulse 73, temperature 98.2 F (36.8 C), temperature source Oral, resp. rate 16, height 4\' 9"  (1.448 m), weight 51.2 kg (112 lb 14 oz), last menstrual period 03/06/2005, SpO2 100 %.  PHYSICAL EXAMINATION:   Physical Exam  GENERAL:  57 y.o.-year-old patient lying in bed in no acute distress.  EYES: Pupils equal, round, reactive to light and accommodation. No scleral icterus. Extraocular muscles intact.  HEENT: Head atraumatic, normocephalic.  + Trach in place and patient connected to Vent.  NECK:  Supple, no JVD. No thyroid enlargement, no tenderness.  LUNGS: Normal breath sounds bilaterally, no wheezing, rales, + upper airway rhonchi. No use of accessory muscles of respiration.  CARDIOVASCULAR: S1, S2 normal. No murmurs, rubs, or gallops.  ABDOMEN: Soft, nontender, nondistended. Bowel sounds present. No organomegaly or mass. + PEG tube in place.  EXTREMITIES: No cyanosis, clubbing or edema b/l.    NEUROLOGIC: Cranial nerves II through XII are intact. No focal Motor or sensory deficits b/l.  Globally weak and follow simple commands.  PSYCHIATRIC: The patient is alert and oriented x 1.  SKIN: No obvious rash, lesion, or ulcer.    LABORATORY PANEL:   CBC Recent Labs  Lab 12/20/17 0422  WBC 7.5  HGB 9.0*  HCT 28.5*  PLT 313   ------------------------------------------------------------------------------------------------------------------  Chemistries  Recent Labs  Lab  12/20/17 0422  NA 137  K 4.0  CL 104  CO2 28  GLUCOSE 106*  BUN 21*  CREATININE 0.35*  CALCIUM 8.8*  MG 1.6*  AST 27  ALT 11  ALKPHOS 47  BILITOT 0.4   ------------------------------------------------------------------------------------------------------------------  Cardiac Enzymes No results for input(s): TROPONINI in the last 168 hours. ------------------------------------------------------------------------------------------------------------------  RADIOLOGY:  Dg Chest 1 View  Result Date: 12/20/2017 CLINICAL DATA:  Shortness of breath. EXAM: CHEST  1 VIEW COMPARISON:  12/16/2017. FINDINGS: Tracheostomy noted with tip over the trachea. Stable positioning. Mediastinum hilar structures normal. Heart size stable. Left lower lobe infiltrate and left-sided pleural effusion. Findings have progressed from prior exam. Small right pleural effusion. No pneumothorax. IMPRESSION: 1. Tracheostomy tube noted with its tip over the trachea stable position. 2. Left lower lobe infiltrate with left-sided pleural effusion. Findings have progressed from prior exam. Small right pleural effusion again noted. Electronically Signed   By: Marcello Moores  Register   On: 12/20/2017 10:12     ASSESSMENT AND PLAN:    1. Ventilator associated pneumonia - pt. Has finished treatment with Ancef, Cipro now.  - afebrile and hemodynamically stable.   2. Acute on chronic hypoxic respiratory failure status post tracheostomy - now on trach collar during the day and trilogy ventilator at night  - ECHO 12/19/17 with EF 60-65%, G1DD  3. Critical illness polyneuropathy.  Patient still very weak.   - cont. PT as tolerated.   4. Severe anxiety  - cont.  Xanax Klonopin, Paxil, Seroquel.  5. COPD - cont. Acute exacerbation.  - cont. Duonebs.   6.  Stage II decubitus ulcer on sacrum.  Local wound care  7. Anemia of chronic disease.  stable at 8-9.  8. Nutrition.  Continue tube feeding as per dietary.  9.  Chronic Pain  - cont.  Fentanyl Patch - percocet added today   Awaiting Placement.    All the records are reviewed and case discussed with Care Management/Social Worker. Management plans discussed with the patient, family and they are in agreement.  CODE STATUS: Partial Code  DVT Prophylaxis: Lovenox  TOTAL TIME TAKING CARE OF THIS PATIENT: 25 minutes.   POSSIBLE D/C unclear   Berna Spare Mayo M.D on 12/20/2017 at 6:09 PM  Between 7am to 6pm - Pager - [phone]  After 6pm go to [email or web address] - [facility]  Office  [phone]  CC: Primary care physician; Center, [facility]

## 2017-12-20 NOTE — Progress Notes (Signed)
Patient removed from Trilogy and placed on 40% Trache collar per MD order at 9:12AM on 12/20/2017.  Heart rate 85, 15 respirations, 100%.   Patient tolerated changed well and is resting comfortably.

## 2017-12-20 NOTE — Progress Notes (Signed)
CRITICAL CARE NOTE  CC  follow up respiratory failure secondary to severe COPD  SUBJECTIVE 57 year old female with respiratory failure secondary to COPD. Patient with successful trach collar trial yesterday. Placed back on Trilogy vent overnight. Patient without event overnight and doing well. Patient continues to be afebrile.      BP 94/61   Pulse 71   Temp 98.2 F (36.8 C)   Resp 16   Ht 4\' 9"  (1.448 m)   Wt 51.2 kg (112 lb 14 oz)   LMP 03/06/2005 (Approximate)   SpO2 100%   BMI 24.43 kg/m    REVIEW OF SYSTEMS  PATIENT IS UNABLE TO PROVIDE COMPLETE REVIEW OF SYSTEM S DUE TO TRACHEOSTOMY STATUS   PHYSICAL EXAMINATION:  GENERAL:critically ill appearing, -resp distress HEAD: Normocephalic, atraumatic.  EYES: Pupils equal, round, reactive to light.  No scleral icterus.  MOUTH: Moist mucosal membrane. NECK: Supple. No thyromegaly. No nodules. No JVD. Tracheostomy status  PULMONARY: +rhonchi, on trilogy vent this morning CARDIOVASCULAR: S1 and S2. Regular rate and rhythm. No murmurs, rubs, or gallops.  GASTROINTESTINAL: Soft, nontender, -distended. No masses. Positive bowel sounds. No hepatosplenomegaly. G-tube status MUSCULOSKELETAL: No swelling, clubbing, or edema.  NEUROLOGIC: alert and oriented, resting in bed. SKIN:intact,warm,dry  ASSESSMENT AND PLAN SYNOPSIS  57 year old female with severe COPD exacerbation with prolonged mechanical ventilation s/p tracheostomy. Patient continuing to do well and has progressed to trach collar during the day and vent support at night. Will discuss with case management at multidisciplinary rounds to see if this will change her expected discharge date.    Severe Hypoxic and Hypercapnic Respiratory Failure -continue trach collar during day and vent support at night  -ABG stable on trach collar and vent -continue bronchodilator therapy: duoneb CXR 7/19: with mild right and left pleural effusion  -will try 20mg  lasix     Renal: -Magnesium 1.6: will replace   NEUROLOGY - alert and oriented -continue clonazepam and quetiapine. PRN xanax for anxiety -continue paxil per psychiatry for depression   CARDIAC ICU monitoring -limited echo yesterday shows EF of 60-65%  GI: g-tube status, doing well with feeds  ID -ventilator associated pneumonia resolved, afebrile   DVT: lovenox GI: pepcid  TRANSFUSIONS AS NEEDED MONITOR FSBS ASSESS the need for LABS as needed   Critical Care Time devoted to patient care services described in this note is 30 minutes.   Overall, patient is critically ill, prognosis is guarded.  Patient with Multiorgan failure and at high risk for cardiac arrest and death.

## 2017-12-21 DIAGNOSIS — E440 Moderate protein-calorie malnutrition: Secondary | ICD-10-CM

## 2017-12-21 DIAGNOSIS — J9611 Chronic respiratory failure with hypoxia: Secondary | ICD-10-CM

## 2017-12-21 MED ORDER — MAGNESIUM OXIDE 400 (241.3 MG) MG PO TABS
400.0000 mg | ORAL_TABLET | Freq: Every day | ORAL | Status: DC
  Administered 2017-12-21 – 2017-12-31 (×11): 400 mg
  Filled 2017-12-21 (×11): qty 1

## 2017-12-21 NOTE — Progress Notes (Signed)
Patient ID: Michelle [name], female   DOB: 11/15/1960, 57 y.o.   MRN: [id number]  [employer] PROGRESS NOTE  Michelle [name] HFW:[id number] DOB: 02-08-1961 DOA: 09/30/2017 PCP: [facility]  HPI/Subjective: Patient breathing on trach collar.  Shakes her head that her breathing is okay.  Now able to move her arms and legs a little bit better.  Objective: Vitals:   12/21/17 1100 12/21/17 1200  BP: 116/66 (!) 132/111  Pulse: 88 90  Resp: (!) 22 (!) 30  Temp:  98.2 F (36.8 C)  SpO2: 97% 98%    Filed Weights   12/03/17 0400 12/11/17 0436 12/18/17 0450  Weight: 56.8 kg (125 lb 3.5 oz) 54.9 kg (121 lb 0.5 oz) 51.2 kg (112 lb 14 oz)    ROS: Review of Systems  Unable to perform ROS: Acuity of condition  Respiratory: Positive for shortness of breath.   Cardiovascular: Negative for chest pain.  Gastrointestinal: Negative for abdominal pain, constipation, diarrhea, nausea and vomiting.   Exam: Physical Exam  HENT:  Nose: No mucosal edema.  Mouth/Throat: No oropharyngeal exudate or posterior oropharyngeal edema.  Eyes: Pupils are equal, round, and reactive to light. Conjunctivae and lids are normal.  Neck: No JVD present. Carotid bruit is not present. No edema present. No thyroid mass and no thyromegaly present.  Cardiovascular: S1 normal and S2 normal. Exam reveals no gallop.  No murmur heard. Pulses:      Dorsalis pedis pulses are 2+ on the right side, and 2+ on the left side.  Respiratory: No respiratory distress. She has no wheezes. She has rhonchi in the right lower field and the left lower field. She has no rales.  GI: Soft. Bowel sounds are normal. There is no tenderness.  Musculoskeletal:       Right ankle: She exhibits no swelling.       Left ankle: She exhibits no swelling.  Lymphadenopathy:    She has no cervical adenopathy.  Neurological: She is alert. No cranial nerve deficit.  Able to lift arms up off the bed including elbows bilaterally.  Able to  straight leg raise.  Able to flex and extend at the ankles.  Skin: Skin is warm. Nails show no clubbing.  Psychiatric: She has a normal mood and affect.      Data Reviewed: Basic Metabolic Panel: Recent Labs  Lab 12/16/17 0547 12/19/17 0509 12/20/17 0422  NA 138 136 137  K 4.1 4.0 4.0  CL 103 103 104  CO2 27 27 28   GLUCOSE 103* 109* 106*  BUN 22* 23* 21*  CREATININE 0.39* 0.42* 0.35*  CALCIUM 8.7* 8.6* 8.8*  MG 1.8  --  1.6*  PHOS 5.1*  --   --    Liver Function Tests: Recent Labs  Lab 12/16/17 0547 12/20/17 0422  AST 18 27  ALT 8 11  ALKPHOS 41 47  BILITOT 0.4 0.4  PROT 6.5 6.9  ALBUMIN 2.2* 2.5*   No results for input(s): LIPASE, AMYLASE in the last 168 hours. No results for input(s): AMMONIA in the last 168 hours. CBC: Recent Labs  Lab 12/16/17 0547 12/19/17 0509 12/20/17 0422  WBC 5.9 8.8 7.5  NEUTROABS  --  6.2 4.6  HGB 8.6* 8.8* 9.0*  HCT 25.2* 26.5* 28.5*  MCV 83.7 84.1 84.8  PLT 281 282 313   Cardiac Enzymes: No results for input(s): CKTOTAL, CKMB, CKMBINDEX, TROPONINI in the last 168 hours. BNP (last 3 results) Recent Labs    06/29/17 0412 08/20/17  1337 09/30/17 1705  BNP 38.0 33.0 66.0     CBG: Recent Labs  Lab 12/14/17 2327 12/16/17 0013 12/17/17 1605  GLUCAP 108* 80 87      Studies: Dg Chest 1 View  Result Date: 12/20/2017 CLINICAL DATA:  Shortness of breath. EXAM: CHEST  1 VIEW COMPARISON:  12/16/2017. FINDINGS: Tracheostomy noted with tip over the trachea. Stable positioning. Mediastinum hilar structures normal. Heart size stable. Left lower lobe infiltrate and left-sided pleural effusion. Findings have progressed from prior exam. Small right pleural effusion. No pneumothorax. IMPRESSION: 1. Tracheostomy tube noted with its tip over the trachea stable position. 2. Left lower lobe infiltrate with left-sided pleural effusion. Findings have progressed from prior exam. Small right pleural effusion again noted. Electronically  Signed   By: Marcello Moores  Register   On: 12/20/2017 10:12    Scheduled Meds: . budesonide (PULMICORT) nebulizer solution  0.5 mg Nebulization BID  . chlorhexidine gluconate (MEDLINE KIT)  15 mL Mouth Rinse BID  . clonazePAM  0.5 mg Per Tube Daily  . clonazePAM  1 mg Per Tube QHS  . enoxaparin (LOVENOX) injection  40 mg Subcutaneous Q24H  . famotidine  20 mg Per Tube Daily  . feeding supplement (PRO-STAT SUGAR FREE 64)  30 mL Per Tube BID  . fentaNYL  25 mcg Transdermal Q72H  . free water  50 mL Per Tube Q8H  . ipratropium-albuterol  3 mL Nebulization Q6H  . mouth rinse  15 mL Mouth Rinse 10 times per day  . multivitamin  15 mL Per Tube Daily  . nystatin  5 mL Oral QID  . nystatin cream   Topical BID  . PARoxetine  20 mg Per Tube BID  . pneumococcal 13-valent conjugate vaccine  0.5 mL Intramuscular Tomorrow-1000  . potassium chloride  20 mEq Per Tube BID  . QUEtiapine  25 mg Per Tube QHS  . saccharomyces boulardii  250 mg Oral BID  . senna-docusate  1 tablet Per Tube BID  . sodium chloride flush  10-40 mL Intracatheter Q12H   Continuous Infusions: . feeding supplement (JEVITY 1.5 CAL/FIBER) 1,000 mL (12/20/17 1520)    Assessment/Plan:  1. Ventilator associated pneumonia.  Patient on Cipro for MSSA and tracheal aspirate. 2. Acute on chronic hypoxic respiratory failure.  Status post tracheostomy.  Patient now on trach collar  during the day and trilogy ventilator at night. 3. Critical illness polyneuropathy.  Patient has much improved since last time I saw her.  Now she is able to get her arms and legs up off the bed on her own. 4. Severe anxiety on Xanax Klonopin Paxil and Seroquel 5. COPD.  Finish steroids for exacerbation.  Continue nebulizer treatments 6. Stage II decubitus ulcer on sacrum. 7. Anemia of chronic disease 8. Chronic pain on fentanyl patch 9. Nutrition.  Tube feeding as per dietary.  Code Status:     Code Status Orders  (From admission, onward)        Start      Ordered   10/28/17 1707  Limited resuscitation (code)  Continuous    Question Answer Comment  In the event of cardiac or respiratory ARREST: Initiate Code Blue, Call Rapid Response No   In the event of cardiac or respiratory ARREST: Perform CPR No   In the event of cardiac or respiratory ARREST: Perform Intubation/Mechanical Ventilation Yes   In the event of cardiac or respiratory ARREST: Use NIPPV/BiPAp only if indicated No   In the event of cardiac or respiratory ARREST:  Administer ACLS medications if indicated No   In the event of cardiac or respiratory ARREST: Perform Defibrillation or Cardioversion if indicated No   Comments Continue trach and ventilator. No resusitation if heart stops.      10/28/17 1711    Code Status History    Date Active Date Inactive Code Status Order ID Comments User Context   09/30/2017 1945 10/28/2017 1711 Full Code [id number]  [name], MD Inpatient   08/20/2017 1713 08/24/2017 1743 Full Code [id number]  Dustin Flock, MD Inpatient   06/29/2017 0625 07/02/2017 2008 Full Code [id number]  Harrie Foreman, MD Inpatient   02/23/2017 0241 02/26/2017 1634 Full Code [id number]  Harrie Foreman, MD Inpatient   01/01/2017 1144 01/04/2017 1354 Full Code [id number]  Bettey Costa, MD Inpatient   05/22/2016 2155 05/27/2016 1645 Full Code [id number]  Fritzi Mandes, MD Inpatient   04/23/2016 2020 04/27/2016 1848 Full Code [id number]  Hower, Aaron Mose, MD ED   02/07/2016 1922 02/10/2016 1835 Full Code [id number]  Gladstone Lighter, MD Inpatient   11/29/2015 0519 12/01/2015 1609 Full Code [id number]  Harrie Foreman, MD Inpatient   11/05/2015 0911 11/07/2015 1606 Full Code [id number]  Hillary Bow, MD ED   10/24/2014 1522 10/27/2014 1630 Full Code [id number]  Hillary Bow, MD Inpatient     Family Communication: As per critical care specialist Disposition Plan: Care manager working on plan  Consultants:  Critical care specialist  Antibiotics:  Cipro  Time spent: 24  minutes  [place]

## 2017-12-21 NOTE — Progress Notes (Signed)
Trach tube change to be done 12/23/17 per Dr. Celesta Aver

## 2017-12-21 NOTE — Progress Notes (Signed)
CRITICAL CARE NOTE  CC  follow up respiratory failure secondary to severe COPD  SUBJECTIVE 57 year old female with respiratory failure secondary to COPD. Patient with successful trach collar trial yesterday. Placed back on Trilogy vent overnight. Patient without event overnight and doing well. Patient continues to be afebrile.      BP 117/69   Pulse 83   Temp 98.2 F (36.8 C) (Oral)   Resp 17   Ht 4\' 9"  (1.448 m)   Wt 112 lb 14 oz (51.2 kg)   LMP 03/06/2005 (Approximate)   SpO2 99%   BMI 24.43 kg/m    REVIEW OF SYSTEMS On trach collar- unable to acquire  PHYSICAL EXAMINATION:  GENERAL:comfortable on vent; no distress HEAD: Normocephalic, atraumatic.  EYES: Pupils equal, round, reactive to light.  No scleral icterus.  MOUTH: Moist mucosal membrane. NECK: Supple. No thyromegaly. No nodules. No JVD. Tracheostomy status  PULMONARY: clear on auscultation  CARDIOVASCULAR: S1 and S2. Regular rate and rhythm. No murmurs, rubs, or gallops.  GASTROINTESTINAL: Soft, nontender, -distended. No masses. Positive bowel sounds. No hepatosplenomegaly. G-tube status MUSCULOSKELETAL: No swelling, clubbing, or edema.  NEUROLOGIC: alert and oriented, resting in bed. SKIN:intact,warm,dry  ASSESSMENT AND PLAN SYNOPSIS  57 year old female with severe COPD exacerbation with prolonged mechanical ventilation s/p tracheostomy. Patient continuing to do well and has progressed to trach collar during the day and vent support at night. Will discuss with case management at multidisciplinary rounds to see if this will change her expected discharge date.    Chronic  Hypoxic and Hypercapnic Respiratory Failure -continue trach collar during day and vent support at night  -ABG stable on trach collar and vent -continue bronchodilator therapy: duoneb   Renal: -Hypomagnesium  Will replace   NEUROLOGY - alert and oriented -continue clonazepam and quetiapine. PRN xanax for anxiety -continue paxil  per psychiatry for depression - will D/C prn Fentanyl, continue with Fentanyl patch and per tube oxycodone with bowel regimen.    CARDIAC ICU monitoring -limited echo yesterday shows EF of 60-65%  GI:  protein caloric malnutrition  g-tube status, doing well with feeds  ID -ventilator associated pneumonia resolved, afebrile   DVT: lovenox GI: pepcid  TRANSFUSIONS AS NEEDED MONITOR FSBS ASSESS the need for LABS as needed

## 2017-12-21 NOTE — Progress Notes (Signed)
Occupational Therapy Treatment Patient Details Name: Michelle [name] MRN: [id number] DOB: 11-03-1960 Today's Date: 12/21/2017    History of present illness 57 y/o F who came to [facility] in late April with severe SOB with COPD exacerbation.  Chest x-ray was clear, patient was intubated and admitted to the ICU.  Trach tube placed 5/16.  Pt demonstrated seizure like activity on 5/23, MRI negative, EEG abnormal but no epileptiform activity.  Pt's PMH includes cocaine abuse, COPD, CAD, hepatitis C, CHF.  Multiple attempts to wean off vent have all been unsuccessful   OT comments  Pt tolerated treatment well this date. Pt participates in education regarding ideal technique, form, and pace for UE therex. Pt then participates in respective exercises at bed level with HOB >45 degrees, tolerating 10 reps bilaterally of each. Pt arm noted to shake after 4-5 reps per arm per exercise and encouraged to self-initiate rest breaks as she needs. OT also provides education regarding real-world ADL application of strengthening targeted muscle groups for which pt nods in understanding. Pt requires 1-2 minute rest break between each exercise. Pt very engaged with therapy this date and appears to be progressing at appropriate level. SNF is still most appropriate recommendation at this time.    Follow Up Recommendations  SNF    Equipment Recommendations  3 in 1 bedside commode    Recommendations for Other Services      Precautions / Restrictions Precautions Precautions: Fall Precaution Comments: trach collar  Restrictions Weight Bearing Restrictions: No       Mobility Bed Mobility                  Transfers                      Balance                                           ADL either performed or assessed with clinical judgement   ADL Overall ADL's : Needs assistance/impaired Eating/Feeding: NPO                                           Vision        Perception     Praxis      Cognition Arousal/Alertness: Awake/alert Behavior During Therapy: WFL for tasks assessed/performed Overall Cognitive Status: Within Functional Limits for tasks assessed                                          Exercises Other Exercises Other Exercises: with yellow resistance band, pt completes 10 reps shld flex with elbow ext, horizontal abd with elbow extension, elbow flex/ext and 3x modified situps-reaching towards feet at bed level with HOB elevated 45 degrees. Pt requires MIN visual/verbal cues for pace/form. Other Exercises: Pt educated on proper form and technique for each exercise as well as ADL/ADL mobility application for targeted muscle groups. Pt nods in understanding with each explanation.    Shoulder Instructions       General Comments      Pertinent Vitals/ Pain       Pain Assessment: Faces Pain Score: 2  Faces Pain Scale:  Hurts a little bit Pain Location: pt mouths "my back" Pain Descriptors / Indicators: Grimacing Pain Intervention(s): Limited activity within patient's tolerance;Monitored during session  Home Living                                          Prior Functioning/Environment              Frequency  Min 1X/week        Progress Toward Goals  OT Goals(current goals can now be found in the care plan section)  Progress towards OT goals: Progressing toward goals  Acute Rehab OT Goals Patient Stated Goal: pt mouths to get stronger OT Goal Formulation: With patient Time For Goal Achievement: 12/31/17 Potential to Achieve Goals: Fair  Plan      Co-evaluation                 AM-PAC PT "6 Clicks" Daily Activity     Outcome Measure   Help from another person eating meals?: Total Help from another person taking care of personal grooming?: A Little Help from another person toileting, which includes using toliet, bedpan, or urinal?: Total Help from another person  bathing (including washing, rinsing, drying)?: Total Help from another person to put on and taking off regular upper body clothing?: A Lot Help from another person to put on and taking off regular lower body clothing?: Total 6 Click Score: 9    End of Session    OT Visit Diagnosis: Muscle weakness (generalized) (M62.81);Pain;Other abnormalities of gait and mobility (R26.89) Pain - Right/Left: (both) Pain - part of body: Leg(pt motions to lower back)   Activity Tolerance Patient tolerated treatment well   Patient Left in bed;with call bell/phone within reach;with bed alarm set   Nurse Communication (pt strength improvements relayed to RN)        Time: 2423-5361 OT Time Calculation (min): 32 min  Charges: OT General Charges $OT Visit: 1 Visit OT Treatments $Therapeutic Exercise: 23-37 mins  Gerrianne Scale, MS, OTR/L ascom [phone] or [phone] 12/21/17, 1:05 PM

## 2017-12-21 NOTE — Progress Notes (Signed)
Physical Therapy Treatment Patient Details Name: Michelle [name] MRN: [id number] DOB: 09-12-1960 Today's Date: 12/21/2017    History of Present Illness 57 y/o F who came to [facility] in late April with severe SOB with COPD exacerbation.  Chest x-ray was clear, patient was intubated and admitted to the ICU.  Trach tube placed 5/16.  Pt demonstrated seizure like activity on 5/23, MRI negative, EEG abnormal but no epileptiform activity.  Pt's PMH includes cocaine abuse, COPD, CAD, hepatitis C, CHF.  Multiple attempts to wean off vent have all been unsuccessful    PT Comments    Pt awake and ready for session.  No family in attendance during session.  Participated in exercises as described below.  Pt on trach collar for session with vitals remaining stable during exercises.  Pt with good effort today but generally fatigued after ex.   Will continue bed mobility/sitting/standing trials next session as appropriate.   Follow Up Recommendations  SNF     Equipment Recommendations  Wheelchair (measurements PT);Wheelchair cushion (measurements PT)    Recommendations for Other Services       Precautions / Restrictions Precautions Precautions: Fall Precaution Comments: trach collar  Restrictions Weight Bearing Restrictions: No    Mobility  Bed Mobility                  Transfers                    Ambulation/Gait                 Stairs             Wheelchair Mobility    Modified Rankin (Stroke Patients Only)       Balance                                            Cognition Arousal/Alertness: Awake/alert Behavior During Therapy: WFL for tasks assessed/performed Overall Cognitive Status: Within Functional Limits for tasks assessed                                        Exercises Other Exercises Other Exercises: 2 x 10 LE A/AAROM for ankle pumps, heel slides, slr, ab/add and SAQ.  Rest periods and needed.  gentle  stretching to B ankles.      General Comments        Pertinent Vitals/Pain Faces Pain Scale: Hurts a little bit Pain Location: grimmacing at times with heel cord stretching. Pain Descriptors / Indicators: Grimacing Pain Intervention(s): Limited activity within patient's tolerance;Monitored during session    Home Living                      Prior Function            PT Goals (current goals can now be found in the care plan section) Progress towards PT goals: Progressing toward goals    Frequency    Min 2X/week      PT Plan Current plan remains appropriate    Co-evaluation              AM-PAC PT "6 Clicks" Daily Activity  Outcome Measure  Difficulty turning over in bed (including adjusting bedclothes, sheets and blankets)?: Unable Difficulty moving from lying on back  to sitting on the side of the bed? : Unable Difficulty sitting down on and standing up from a chair with arms (e.g., wheelchair, bedside commode, etc,.)?: Unable Help needed moving to and from a bed to chair (including a wheelchair)?: Total Help needed walking in hospital room?: Total Help needed climbing 3-5 steps with a railing? : Total 6 Click Score: 6    End of Session Equipment Utilized During Treatment: Oxygen Activity Tolerance: Patient tolerated treatment well Patient left: in bed;with call bell/phone within reach;with bed alarm set         Time: 6861-6837 PT Time Calculation (min) (ACUTE ONLY): 16 min  Charges:  $Therapeutic Exercise: 8-22 mins                    G Codes:       Chesley Noon, PTA 12/21/17, 10:22 AM

## 2017-12-22 NOTE — Progress Notes (Signed)
Patient ID: Michelle [name], female   DOB: 08-Sep-1960, 57 y.o.   MRN: [id number]  [employer] PROGRESS NOTE  Michelle [name] SEL:[id number] DOB: 1961-05-01 DOA: 09/30/2017 PCP: [facility] [place]  HPI/Subjective: Patient shakes her head to a few questions.  Patient was breathing on the ventilator when I saw her today.  She is able to raise her arms up off the bed and do straight leg raise.  Objective: Vitals:   12/22/17 0930 12/22/17 1000  BP:  106/69  Pulse: 80 79  Resp: 16 16  Temp:  98.6 F (37 C)  SpO2: 94% 93%    Filed Weights   12/11/17 0436 12/18/17 0450 12/22/17 0400  Weight: 54.9 kg (121 lb 0.5 oz) 51.2 kg (112 lb 14 oz) 48.4 kg (106 lb 11.2 oz)    ROS: Review of Systems  Unable to perform ROS: Acuity of condition  Respiratory: Positive for shortness of breath.   Cardiovascular: Negative for chest pain.  Gastrointestinal: Positive for abdominal pain.   Exam: Physical Exam  HENT:  Nose: No mucosal edema.  Mouth/Throat: No oropharyngeal exudate or posterior oropharyngeal edema.  Eyes: Pupils are equal, round, and reactive to light. Conjunctivae and lids are normal.  Neck: No JVD present. Carotid bruit is not present. No edema present. No thyroid mass and no thyromegaly present.  Cardiovascular: S1 normal and S2 normal. Exam reveals no gallop.  No murmur heard. Pulses:      Dorsalis pedis pulses are 2+ on the right side, and 2+ on the left side.  Respiratory: No respiratory distress. She has no wheezes. She has rhonchi in the right lower field and the left lower field. She has no rales.  GI: Soft. Bowel sounds are normal. There is no tenderness.  Musculoskeletal:       Right ankle: She exhibits no swelling.       Left ankle: She exhibits no swelling.  Lymphadenopathy:    She has no cervical adenopathy.  Neurological: She is alert. No cranial nerve deficit.  Able to lift arms up off the bed including elbows bilaterally.  Able to straight leg raise.   Able to flex and extend at the ankles.  Skin: Skin is warm. Nails show no clubbing.  Psychiatric: She has a normal mood and affect.      Data Reviewed: Basic Metabolic Panel: Recent Labs  Lab 12/16/17 0547 12/19/17 0509 12/20/17 0422  NA 138 136 137  K 4.1 4.0 4.0  CL 103 103 104  CO2 27 27 28   GLUCOSE 103* 109* 106*  BUN 22* 23* 21*  CREATININE 0.39* 0.42* 0.35*  CALCIUM 8.7* 8.6* 8.8*  MG 1.8  --  1.6*  PHOS 5.1*  --   --    Liver Function Tests: Recent Labs  Lab 12/16/17 0547 12/20/17 0422  AST 18 27  ALT 8 11  ALKPHOS 41 47  BILITOT 0.4 0.4  PROT 6.5 6.9  ALBUMIN 2.2* 2.5*   CBC: Recent Labs  Lab 12/16/17 0547 12/19/17 0509 12/20/17 0422  WBC 5.9 8.8 7.5  NEUTROABS  --  6.2 4.6  HGB 8.6* 8.8* 9.0*  HCT 25.2* 26.5* 28.5*  MCV 83.7 84.1 84.8  PLT 281 282 313     CBG: Recent Labs  Lab 12/16/17 0013 12/17/17 1605  GLUCAP 80 87    Scheduled Meds: . budesonide (PULMICORT) nebulizer solution  0.5 mg Nebulization BID  . chlorhexidine gluconate (MEDLINE KIT)  15 mL Mouth Rinse BID  . clonazePAM  0.5 mg Per Tube Daily  . clonazePAM  1 mg Per Tube QHS  . enoxaparin (LOVENOX) injection  40 mg Subcutaneous Q24H  . famotidine  20 mg Per Tube Daily  . feeding supplement (PRO-STAT SUGAR FREE 64)  30 mL Per Tube BID  . fentaNYL  25 mcg Transdermal Q72H  . free water  50 mL Per Tube Q8H  . ipratropium-albuterol  3 mL Nebulization Q6H  . magnesium oxide  400 mg Per Tube Daily  . mouth rinse  15 mL Mouth Rinse 10 times per day  . multivitamin  15 mL Per Tube Daily  . nystatin  5 mL Oral QID  . nystatin cream   Topical BID  . PARoxetine  20 mg Per Tube BID  . pneumococcal 13-valent conjugate vaccine  0.5 mL Intramuscular Tomorrow-1000  . potassium chloride  20 mEq Per Tube BID  . QUEtiapine  25 mg Per Tube QHS  . saccharomyces boulardii  250 mg Oral BID  . senna-docusate  1 tablet Per Tube BID   Continuous Infusions: . feeding supplement (JEVITY 1.5  CAL/FIBER) 1,000 mL (12/21/17 1811)    Assessment/Plan:  1. Acute on chronic hypoxic respiratory failure.  Status post tracheostomy.  Patient today was on the ventilator when I saw her. 2. Ventilator associated pneumonia.   looks like critical care team discontinued the Cipro. 3. Critical illness polyneuropathy.  Patient now able to lift her arms up off the bed and straight leg raise. 4. Severe anxiety on Xanax Klonopin Paxil and Seroquel 5. COPD.  Finish steroids for exacerbation.  Continue nebulizer treatments 6. Stage II decubitus ulcer on sacrum. 7. Anemia of chronic disease 8. Chronic pain on fentanyl patch 9. Nutrition.  Tube feeding as per dietary.  Code Status:     Code Status Orders  (From admission, onward)        Start     Ordered   10/28/17 1707  Limited resuscitation (code)  Continuous    Question Answer Comment  In the event of cardiac or respiratory ARREST: Initiate Code Blue, Call Rapid Response No   In the event of cardiac or respiratory ARREST: Perform CPR No   In the event of cardiac or respiratory ARREST: Perform Intubation/Mechanical Ventilation Yes   In the event of cardiac or respiratory ARREST: Use NIPPV/BiPAp only if indicated No   In the event of cardiac or respiratory ARREST: Administer ACLS medications if indicated No   In the event of cardiac or respiratory ARREST: Perform Defibrillation or Cardioversion if indicated No   Comments Continue trach and ventilator. No resusitation if heart stops.      10/28/17 1711    Code Status History    Date Active Date Inactive Code Status Order ID Comments User Context   09/30/2017 1945 10/28/2017 1711 Full Code [id number]  [name], MD Inpatient   08/20/2017 1713 08/24/2017 1743 Full Code [id number]  Dustin Flock, MD Inpatient   06/29/2017 0625 07/02/2017 2008 Full Code [id number]  Harrie Foreman, MD Inpatient   02/23/2017 0241 02/26/2017 1634 Full Code [id number]  Harrie Foreman, MD Inpatient    01/01/2017 1144 01/04/2017 1354 Full Code [id number]  Bettey Costa, MD Inpatient   05/22/2016 2155 05/27/2016 1645 Full Code [id number]  Fritzi Mandes, MD Inpatient   04/23/2016 2020 04/27/2016 1848 Full Code [id number]  Hower, Aaron Mose, MD ED   02/07/2016 1922 02/10/2016 1835 Full Code [id number]  Gladstone Lighter, MD Inpatient   11/29/2015 0519 12/01/2015 1609 Full Code [id number]  Harrie Foreman, MD Inpatient   11/05/2015 0911 11/07/2015 1606 Full Code [id number]  Hillary Bow, MD ED   10/24/2014 1522 10/27/2014 1630 Full Code [id number]  Hillary Bow, MD Inpatient     Family Communication: As per critical care specialist Disposition Plan: Care manager working on plan  Consultants:  Critical care specialist  Antibiotics:  Cipro  Time spent: 25 minutes  [place]

## 2017-12-22 NOTE — Progress Notes (Signed)
CRITICAL CARE NOTE  CC  follow up respiratory failure secondary to severe COPD  SUBJECTIVE 57 year old female with respiratory failure secondary to COPD. Patient with successful trach collar trial yesterday. Placed back on Trilogy vent overnight. Patient without event overnight and doing well. Patient continues to be afebrile.      BP 106/69 (BP Location: Left Arm)   Pulse 79   Temp 98.6 F (37 C) (Oral)   Resp 16   Ht 4\' 9"  (1.448 m)   Wt 106 lb 11.2 oz (48.4 kg)   LMP 03/06/2005 (Approximate)   SpO2 93%   BMI 23.09 kg/m    REVIEW OF SYSTEMS On vent support; patient did not wish to try trach collar today  PHYSICAL EXAMINATION:  GENERAL:comfortable on vent; no distress HEAD: Normocephalic, atraumatic.  EYES: Pupils equal, round, reactive to light.  No scleral icterus.  MOUTH: Moist mucosal membrane. NECK: Supple. No thyromegaly. No nodules. No JVD. Tracheostomy status  PULMONARY: clear on auscultation  CARDIOVASCULAR: S1 and S2. Regular rate and rhythm. No murmurs, rubs, or gallops.  GASTROINTESTINAL: Soft, nontender, -distended. No masses. Positive bowel sounds. No hepatosplenomegaly. G-tube status MUSCULOSKELETAL: No swelling, clubbing, or edema.  NEUROLOGIC: alert and oriented, resting in bed. SKIN:intact,warm,dry, sacral DU  ASSESSMENT AND PLAN SYNOPSIS  57 year old female with severe COPD exacerbation with prolonged mechanical ventilation s/p tracheostomy. Patient continuing to do well and has progressed to trach collar during the day and vent support at night.    Chronic  Hypoxic and Hypercapnic Respiratory Failure -continue trach collar during day as tolerate and vent support at night -continue bronchodilator therapy: duoneb - will consult speech to do passe muir valve trials/training  Renal: -no active issues  NEUROLOGY - alert and oriented -continue clonazepam and quetiapine. PRN xanax for anxiety -continue paxil per psychiatry for depression -  continue with Fentanyl patch and per tube oxycodone with bowel regimen.   CARDIAC ICU monitoring - EF of 60-65%  GI:  protein caloric malnutrition  g-tube status, doing well with feeds  ID -ventilator associated pneumonia resolved, afebrile   DVT: lovenox GI: pepcid  TRANSFUSIONS AS NEEDED MONITOR FSBS ASSESS the need for LABS as needed

## 2017-12-22 NOTE — Plan of Care (Signed)
VSS O/N, pt. Alert and oriented, UOP adequate Pt. On home ventilator O/N without incident. No care concerns at this time. Report given to [facility], RN

## 2017-12-22 NOTE — Progress Notes (Signed)
Pt. Refused to go on trach collar at this time,pt. Remains on home vent.

## 2017-12-23 MED ORDER — BUDESONIDE 0.25 MG/2ML IN SUSP
0.2500 mg | Freq: Two times a day (BID) | RESPIRATORY_TRACT | Status: DC
  Administered 2017-12-23 – 2017-12-31 (×16): 0.25 mg via RESPIRATORY_TRACT
  Filled 2017-12-23 (×15): qty 2

## 2017-12-23 NOTE — Progress Notes (Addendum)
Patient ID: Michelle [name], female   DOB: June 21, 1960, 57 y.o.   MRN: [id number]  [employer] PROGRESS NOTE  Michelle [name] EZM:[id number] DOB: 1960/07/28 DOA: 09/30/2017 PCP: [facility] [place]  HPI/Subjective: Patient shakes her head that she was having abdominal pain.  Mel the words that she is having diarrhea.  Still little short of breath.  Patient able to lift her arms up off the bed and straight leg raise.  Objective: Vitals:   12/23/17 1100 12/23/17 1145  BP: 115/78   Pulse: 100   Resp: (!) 23   Temp:    SpO2: 96% 99%    Filed Weights   12/11/17 0436 12/18/17 0450 12/22/17 0400  Weight: 54.9 kg (121 lb 0.5 oz) 51.2 kg (112 lb 14 oz) 48.4 kg (106 lb 11.2 oz)    ROS: Review of Systems  Unable to perform ROS: Acuity of condition  Respiratory: Positive for shortness of breath.   Cardiovascular: Negative for chest pain.  Gastrointestinal: Positive for abdominal pain and diarrhea.   Exam: Physical Exam  HENT:  Nose: No mucosal edema.  Mouth/Throat: No oropharyngeal exudate or posterior oropharyngeal edema.  Eyes: Pupils are equal, round, and reactive to light. Conjunctivae and lids are normal.  Neck: No JVD present. Carotid bruit is not present. No edema present. No thyroid mass and no thyromegaly present.  Cardiovascular: S1 normal and S2 normal. Exam reveals no gallop.  No murmur heard. Pulses:      Dorsalis pedis pulses are 2+ on the right side, and 2+ on the left side.  Respiratory: No respiratory distress. She has no wheezes. She has rhonchi in the right lower field and the left lower field. She has no rales.  GI: Soft. Bowel sounds are normal. There is no tenderness.  Musculoskeletal:       Right ankle: She exhibits no swelling.       Left ankle: She exhibits no swelling.  Lymphadenopathy:    She has no cervical adenopathy.  Neurological: She is alert. No cranial nerve deficit.  Able to lift arms up off the bed including elbows bilaterally.  Able  to straight leg raise.  Able to flex and extend at the ankles.  Skin: Skin is warm. Nails show no clubbing.  Psychiatric: She has a normal mood and affect.      Data Reviewed: Basic Metabolic Panel: Recent Labs  Lab 12/19/17 0509 12/20/17 0422  NA 136 137  K 4.0 4.0  CL 103 104  CO2 27 28  GLUCOSE 109* 106*  BUN 23* 21*  CREATININE 0.42* 0.35*  CALCIUM 8.6* 8.8*  MG  --  1.6*   Liver Function Tests: Recent Labs  Lab 12/20/17 0422  AST 27  ALT 11  ALKPHOS 47  BILITOT 0.4  PROT 6.9  ALBUMIN 2.5*   CBC: Recent Labs  Lab 12/19/17 0509 12/20/17 0422  WBC 8.8 7.5  NEUTROABS 6.2 4.6  HGB 8.8* 9.0*  HCT 26.5* 28.5*  MCV 84.1 84.8  PLT 282 313     CBG: Recent Labs  Lab 12/17/17 1605  GLUCAP 87    Scheduled Meds: . budesonide (PULMICORT) nebulizer solution  0.25 mg Nebulization BID  . chlorhexidine gluconate (MEDLINE KIT)  15 mL Mouth Rinse BID  . clonazePAM  0.5 mg Per Tube Daily  . clonazePAM  1 mg Per Tube QHS  . enoxaparin (LOVENOX) injection  40 mg Subcutaneous Q24H  . famotidine  20 mg Per Tube Daily  . feeding supplement (PRO-STAT  SUGAR FREE 64)  30 mL Per Tube BID  . fentaNYL  25 mcg Transdermal Q72H  . free water  50 mL Per Tube Q8H  . ipratropium-albuterol  3 mL Nebulization Q6H  . magnesium oxide  400 mg Per Tube Daily  . mouth rinse  15 mL Mouth Rinse 10 times per day  . multivitamin  15 mL Per Tube Daily  . nystatin  5 mL Oral QID  . nystatin cream   Topical BID  . PARoxetine  20 mg Per Tube BID  . potassium chloride  20 mEq Per Tube BID  . QUEtiapine  25 mg Per Tube QHS  . saccharomyces boulardii  250 mg Oral BID  . senna-docusate  1 tablet Per Tube BID   Continuous Infusions: . feeding supplement (JEVITY 1.5 CAL/FIBER) 1,000 mL (12/22/17 1436)    Assessment/Plan:  1. Acute on chronic hypoxic respiratory failure.  Status post tracheostomy.  Patient today on the ventilator when I saw her. 2. Ventilator associated pneumonia.    Critical care team discontinued antibiotics. 3. Critical illness polyneuropathy.  Patient now able to lift her arms up off the bed and straight leg raise. 4. Severe anxiety on Xanax Klonopin Paxil and Seroquel 5. COPD.  Finish steroids for exacerbation.  Continue nebulizer treatments 6. Stage II decubitus ulcer on sacrum. 7. Anemia of chronic disease 8. Chronic pain on fentanyl patch 9. Nutrition.  Tube feeding as per dietary.  Code Status:     Code Status Orders  (From admission, onward)        Start     Ordered   10/28/17 1707  Limited resuscitation (code)  Continuous    Question Answer Comment  In the event of cardiac or respiratory ARREST: Initiate Code Blue, Call Rapid Response No   In the event of cardiac or respiratory ARREST: Perform CPR No   In the event of cardiac or respiratory ARREST: Perform Intubation/Mechanical Ventilation Yes   In the event of cardiac or respiratory ARREST: Use NIPPV/BiPAp only if indicated No   In the event of cardiac or respiratory ARREST: Administer ACLS medications if indicated No   In the event of cardiac or respiratory ARREST: Perform Defibrillation or Cardioversion if indicated No   Comments Continue trach and ventilator. No resusitation if heart stops.      10/28/17 1711    Code Status History    Date Active Date Inactive Code Status Order ID Comments User Context   09/30/2017 1945 10/28/2017 1711 Full Code [id number]  [name], MD Inpatient   08/20/2017 1713 08/24/2017 1743 Full Code [id number]  Dustin Flock, MD Inpatient   06/29/2017 0625 07/02/2017 2008 Full Code [id number]  Harrie Foreman, MD Inpatient   02/23/2017 0241 02/26/2017 1634 Full Code [id number]  Harrie Foreman, MD Inpatient   01/01/2017 1144 01/04/2017 1354 Full Code [id number]  Bettey Costa, MD Inpatient   05/22/2016 2155 05/27/2016 1645 Full Code [id number]  Fritzi Mandes, MD Inpatient   04/23/2016 2020 04/27/2016 1848 Full Code [id number]  Hower, Aaron Mose, MD ED    02/07/2016 1922 02/10/2016 1835 Full Code [id number]  Gladstone Lighter, MD Inpatient   11/29/2015 0519 12/01/2015 1609 Full Code [id number]  Harrie Foreman, MD Inpatient   11/05/2015 0911 11/07/2015 1606 Full Code [id number]  Hillary Bow, MD ED   10/24/2014 1522 10/27/2014 1630 Full Code [id number]  Hillary Bow, MD Inpatient     Family Communication: As per critical care specialist Disposition Plan: Care manager working on plan  Consultants:  Critical care specialist  Time spent: 24 minutes  [place]

## 2017-12-23 NOTE — Care Management (Addendum)
Message sent to [facility] with [facility] to see if he needed any updates on patient. RNCM would like update on authorization also.  RNCM, patient and family hoping for transition to home Thursday.  Update at 0911A: Charlie responding that Medicaid still pending authorization for home nursing but that he has everything he needs from hospital and patient's caregivers.  RN/RT will need to do O2 sat  assessment for home O2 qualification.

## 2017-12-23 NOTE — Progress Notes (Signed)
SLP Cancellation Note  Patient Details Name: Michelle [name] MRN: [id number] DOB: [birth year]   Cancelled treatment:       Reason Eval/Treat Not Completed: Medical issues which prohibited therapy;Patient not medically ready(chart reviewed; consulted NSG re: pt's status). Per RT and NSG, pt has returned to full vent support d/t c/o difficulty breathing(feeling that she has sufficient breath support).  ST services will be available for reconsult for PMV when pt is weaned to [facility] and tolerating cuff deflated at rest. NSG/RT agreed.    Orinda Kenner, MS, CCC-SLP [place] 12/23/2017, 12:36 PM

## 2017-12-23 NOTE — Progress Notes (Signed)
Sputum sample suctioned from trach.. Collected and sent to lab

## 2017-12-23 NOTE — Progress Notes (Signed)
CRITICAL CARE NOTE  CC  follow up respiratory failure secondary to severe COPD  SUBJECTIVE 57 year old female with respiratory failure secondary to COPD. Patient was on trach collar during the day on 7/20. However, refused trach collar yesterday and today. Otherwise, patient is doing well. Patient is requesting pain medicine this morning and states "everything" hurts. Per nursing report, patient is refusing mouth care and has not been happy with nursing staff. Case discussed at multidisciplinary rounds.   SIGNIFICANT EVENTS Patient refusing trach collar yesterday and this morning.    BP (!) 214/124   Pulse (!) 120   Temp (P) 100 F (37.8 C) (Oral)   Resp (!) 31   Ht 4\' 9"  (1.448 m)   Wt 48.4 kg (106 lb 11.2 oz)   LMP 03/06/2005 (Approximate)   SpO2 90%   BMI 23.09 kg/m    REVIEW OF SYSTEMS  PATIENT IS UNABLE TO PROVIDE COMPLETE REVIEW OF SYSTEM S DUE TO TRACHEOSTOMY STATUS   PHYSICAL EXAMINATION:  GENERAL:critically ill appearing, -resp distress HEAD: Normocephalic, atraumatic.  EYES: Pupils equal, round, reactive to light.  No scleral icterus.  MOUTH: Moist mucosal membrane. NECK: Supple. No thyromegaly. No nodules. No JVD. Tracheostomy status PULMONARY: +rhonchi, -wheezing CARDIOVASCULAR: S1 and S2. Regular rate and rhythm. No murmurs, rubs, or gallops. 2+ dorsalis pedis pulses GASTROINTESTINAL: Soft, nontender, -distended. No masses. Positive bowel sounds. No hepatosplenomegaly. G tube status MUSCULOSKELETAL: No swelling, clubbing, or edema.  NEUROLOGIC: alert and oriented. Following commands SKIN:intact,warm,dry  ASSESSMENT AND PLAN SYNOPSIS  57 year old female with respiratory failure secondary to severe COPD. Patient had been tolerating trach collar during the day 7/18-7/20, but has been refusing the past two days and is on ventilator this morning.   Severe Hypoxic and Hypercapnic Respiratory Failure -continue Full MV support with continued trial of trach  collar -continue Bronchodilator Therapy -Case management hoping for discharge to home on Thursday 7/25 pending medicaid authorization. Need O2 assessment for home O2 qualification    NEUROLOGY - alert and oriented -continue clonazepam and quetiapine. PRN xanax for anxiety (last given 7/22 at 0319) -continue paxil per psychiatry -continue fentanyl patch and PRN percocet (last given 7/22 at 0319).  CARDIAC ICU monitoring -no current concerns  GI: -g tube status -doing well with feeds  ID -patient has continued to be afebrile -ventilator associated pneumonia resolved   DVT: lovenox GI: pepcid  TRANSFUSIONS AS NEEDED MONITOR FSBS ASSESS the need for LABS as needed  Continue OT and PT   Critical Care Time devoted to patient care services described in this note is 25 minutes.   Overall, patient is critically ill, prognosis is guarded.  Patient with Multiorgan failure and at high risk for cardiac arrest and death.

## 2017-12-23 NOTE — Progress Notes (Signed)
PT Cancellation Note  Patient Details Name: Michelle [name] MRN: [id number] DOB: Feb 19, 1961   Cancelled Treatment:    Reason Eval/Treat Not Completed: Medical issues which prohibited therapy(Per discussion with SLP, patient with increased anxiety, increased perceived SOB; now placed back on vent for breathing support.  Will hold exertional activity at this time and continue efforts next date as medically appropriate.)   [name], PT, DPT, NCS 12/23/17, 12:01 PM [phone]

## 2017-12-23 NOTE — Progress Notes (Signed)
OT Cancellation Note  Patient Details Name: Michelle [name] MRN: [id number] DOB: November 24, 1960   Cancelled Treatment:    Reason Eval/Treat Not Completed: Medical issues which prohibited therapy. Spoke with PT. Per PT's discussion with SLP, patient with increased anxiety, increased perceived SOB; now placed back on vent for breathing support.  Will hold exertional activity at this time and continue efforts next date as medically appropriate.  Jeni Salles, MPH, MS, OTR/L ascom [phone] 12/23/17, 1:13 PM

## 2017-12-23 NOTE — Progress Notes (Signed)
Taken off home vent and placed on Ivent due to patient's complaint of SOB

## 2017-12-24 ENCOUNTER — Inpatient Hospital Stay: Payer: Medicaid Other

## 2017-12-24 LAB — CBC
HCT: 29.1 % — ABNORMAL LOW (ref 35.0–47.0)
Hemoglobin: 9.4 g/dL — ABNORMAL LOW (ref 12.0–16.0)
MCH: 26.3 pg (ref 26.0–34.0)
MCHC: 32.2 g/dL (ref 32.0–36.0)
MCV: 81.5 fL (ref 80.0–100.0)
PLATELETS: 335 10*3/uL (ref 150–440)
RBC: 3.57 MIL/uL — ABNORMAL LOW (ref 3.80–5.20)
RDW: 18.5 % — AB (ref 11.5–14.5)
WBC: 11.3 10*3/uL — AB (ref 3.6–11.0)

## 2017-12-24 MED ORDER — PAROXETINE HCL 20 MG PO TABS
20.0000 mg | ORAL_TABLET | Freq: Every day | ORAL | Status: DC
  Administered 2017-12-25 – 2017-12-30 (×6): 20 mg
  Filled 2017-12-24 (×7): qty 1

## 2017-12-24 MED ORDER — DOXYCYCLINE HYCLATE 100 MG PO TABS
100.0000 mg | ORAL_TABLET | Freq: Two times a day (BID) | ORAL | Status: DC
  Administered 2017-12-24 – 2017-12-31 (×15): 100 mg
  Filled 2017-12-24 (×15): qty 1

## 2017-12-24 NOTE — Care Management (Addendum)
RNCM received notification from [facility] that authorization date for private duty nursing will be Tuesday December 31, 2017.  Nursing to observe patient's assigned caregiver/daughters [name] and [place], performing trach care and vent needs until that time.  Both daughters aware and agree on this expectation.  O2 sat assessment will be needed but not before 48 hours of discharge. [facility] agreed to take patient early July 30.  Palliative team updated; CSW updated; [facility] home care updated; ICUP updated.

## 2017-12-24 NOTE — Progress Notes (Signed)
CRITICAL CARE NOTE  CC  follow up respiratory failure secondary to severe COPD  SUBJECTIVE 57 year old female with respiratory failure secondary to severe COPD. Patient remains on ventilator with tracheostomy. Patient tells me she is having some abdominal pain.    SIGNIFICANT EVENTS  Patient with fever of 101.7 last night. Afebrile this morning. Respiratory culture ordered.   BP 115/78   Pulse (!) 101   Temp 99.4 F (37.4 C) (Oral)   Resp 18   Ht 4\' 9"  (1.448 m)   Wt 48.4 kg (106 lb 11.2 oz)   LMP 03/06/2005 (Approximate)   SpO2 94%   BMI 23.09 kg/m    REVIEW OF SYSTEMS  PATIENT IS UNABLE TO PROVIDE COMPLETE REVIEW OF SYSTEM S DUE TO TRACHEOSTOMY STATUS   PHYSICAL EXAMINATION:  GENERAL:critically ill appearing, -resp distress HEAD: Normocephalic, atraumatic.  EYES: Pupils equal, round, reactive to light.  No scleral icterus.  MOUTH: Moist mucosal membrane. NECK: Supple. No thyromegaly. No nodules. No JVD.  PULMONARY: +rhonchi, -wheezing CARDIOVASCULAR: S1 and S2. Regular rate and rhythm. No murmurs, rubs, or gallops. 2+ dorsalis pedis pulses bilaterally GASTROINTESTINAL: Soft, nontender, -distended. No masses. Positive bowel sounds. No hepatosplenomegaly.  MUSCULOSKELETAL: No swelling, clubbing, or edema.  NEUROLOGIC: alert and oriented, following verbal commands SKIN:intact,warm,dry  ASSESSMENT AND PLAN SYNOPSIS  57 year old female with respiratory failure secondary to severe COPD. Patient continuing to refuse trach collar and remains on ventilator. Patient spiked fever last night and is afebrile this morning. Case management hoping for discharge to home on Thursday 7/25 pending medicaid authorization.    Severe Hypoxic and Hypercapnic Respiratory Failure -continue Full MV support -continue Bronchodilator Therapy -Wean to trach collar as patient tolerates -CXR and CBC for fever, pending respiratory culture    NEUROLOGY - alert and oriented -continue  clonazepam and quetiapine. PRN xanax for anxeity -paxil per psychiatry -fentanyl patch and PRN percocet for pain   CARDIAC ICU monitoring -no current concerns  GI: -g tube status -doing well with feeds -consider U/A for abdominal pain  ID -patient with Tmax of 101.7 yesterday evening -afebrile this morning. Will follow up on respiratory culture, order CXR and CBC  -start Doxycycline 100mg  BID x 10 days for MSSA coverage   DVT: lovenox GI: pepcid  TRANSFUSIONS AS NEEDED MONITOR FSBS ASSESS the need for LABS as needed   Critical Care Time devoted to patient care services described in this note is 25 minutes.   Overall, patient is critically ill, prognosis is guarded.  Patient with Multiorgan failure and at high risk for cardiac arrest and death.

## 2017-12-24 NOTE — Progress Notes (Signed)
PT Cancellation Note  Patient Details Name: Michelle [name] MRN: [id number] DOB: 1960-06-10   Cancelled Treatment:    Reason Eval/Treat Not Completed: Fatigue/lethargy limiting ability to participate. Treatment attempted; pt sleeping and unable to awaken. Re attempt at a later date.    Larae Grooms, PTA 12/24/2017, 4:55 PM

## 2017-12-24 NOTE — Clinical Social Work Note (Signed)
CSW informed by RN CM that discharge goal date is next Tuesday for home. Shela Leff MSW,LcSW [phone]

## 2017-12-24 NOTE — Progress Notes (Signed)
Patient ID: Michelle [name], female   DOB: 02-19-1961, 57 y.o.   MRN: [id number]  [employer] PROGRESS NOTE  Michelle [name]:[id number] DOB: 16-Mar-1961 DOA: 09/30/2017 PCP: [facility], [facility]  HPI/Subjective: Patient had fever last night.  Patient having some abdominal discomfort and some shortness of breath.  Objective: Vitals:   12/24/17 1300 12/24/17 1400  BP: 131/75 99/73  Pulse: 96 (!) 104  Resp: 20 (!) 24  Temp:    SpO2: 90% 92%    Filed Weights   12/11/17 0436 12/18/17 0450 12/22/17 0400  Weight: 54.9 kg (121 lb 0.5 oz) 51.2 kg (112 lb 14 oz) 48.4 kg (106 lb 11.2 oz)    ROS: Review of Systems  Unable to perform ROS: Acuity of condition  Respiratory: Positive for shortness of breath.   Cardiovascular: Negative for chest pain.  Gastrointestinal: Positive for abdominal pain and diarrhea.   Exam: Physical Exam  HENT:  Nose: No mucosal edema.  Mouth/Throat: No oropharyngeal exudate or posterior oropharyngeal edema.  Eyes: Pupils are equal, round, and reactive to light. Conjunctivae and lids are normal.  Neck: No JVD present. Carotid bruit is not present. No edema present. No thyroid mass and no thyromegaly present.  Cardiovascular: S1 normal and S2 normal. Exam reveals no gallop.  No murmur heard. Pulses:      Dorsalis pedis pulses are 2+ on the right side, and 2+ on the left side.  Respiratory: No respiratory distress. She has no wheezes. She has rhonchi in the right middle field, the right lower field, the left middle field and the left lower field. She has no rales.  GI: Soft. Bowel sounds are normal. There is no tenderness.  Musculoskeletal:       Right ankle: She exhibits no swelling.       Left ankle: She exhibits no swelling.  Lymphadenopathy:    She has no cervical adenopathy.  Neurological: She is alert. No cranial nerve deficit.  Able to lift arms up off the bed including elbows bilaterally.  Able to straight leg raise.  Able to flex and  extend at the ankles.  Skin: Skin is warm. Nails show no clubbing.  Psychiatric: She has a normal mood and affect.      Data Reviewed: Basic Metabolic Panel: Recent Labs  Lab 12/19/17 0509 12/20/17 0422  NA 136 137  K 4.0 4.0  CL 103 104  CO2 27 28  GLUCOSE 109* 106*  BUN 23* 21*  CREATININE 0.42* 0.35*  CALCIUM 8.6* 8.8*  MG  --  1.6*   Liver Function Tests: Recent Labs  Lab 12/20/17 0422  AST 27  ALT 11  ALKPHOS 47  BILITOT 0.4  PROT 6.9  ALBUMIN 2.5*   CBC: Recent Labs  Lab 12/19/17 0509 12/20/17 0422 12/24/17 1118  WBC 8.8 7.5 11.3*  NEUTROABS 6.2 4.6  --   HGB 8.8* 9.0* 9.4*  HCT 26.5* 28.5* 29.1*  MCV 84.1 84.8 81.5  PLT 282 313 335     CBG: Recent Labs  Lab 12/17/17 1605  GLUCAP 87    Scheduled Meds: . budesonide (PULMICORT) nebulizer solution  0.25 mg Nebulization BID  . chlorhexidine gluconate (MEDLINE KIT)  15 mL Mouth Rinse BID  . clonazePAM  0.5 mg Per Tube Daily  . clonazePAM  1 mg Per Tube QHS  . doxycycline  100 mg Per Tube Q12H  . enoxaparin (LOVENOX) injection  40 mg Subcutaneous Q24H  . famotidine  20 mg Per Tube Daily  .  feeding supplement (PRO-STAT SUGAR FREE 64)  30 mL Per Tube BID  . fentaNYL  25 mcg Transdermal Q72H  . free water  50 mL Per Tube Q8H  . ipratropium-albuterol  3 mL Nebulization Q6H  . magnesium oxide  400 mg Per Tube Daily  . mouth rinse  15 mL Mouth Rinse 10 times per day  . multivitamin  15 mL Per Tube Daily  . [START ON 12/25/2017] PARoxetine  20 mg Per Tube QHS  . potassium chloride  20 mEq Per Tube BID  . QUEtiapine  25 mg Per Tube QHS  . saccharomyces boulardii  250 mg Oral BID  . senna-docusate  1 tablet Per Tube BID   Continuous Infusions: . feeding supplement (JEVITY 1.5 CAL/FIBER) 1,000 mL (12/23/17 2055)    Assessment/Plan:  1. Acute on chronic hypoxic respiratory failure.  Status post tracheostomy.  Patient on ventilator when I saw her. 2. Fever with ventilator associated pneumonia.    Critical care team started doxycycline. 3. Critical illness polyneuropathy.  Patient now able to lift her arms up off the bed and straight leg raise. 4. Severe anxiety on Xanax Klonopin Paxil and Seroquel 5. COPD.  Continue nebulizer treatments 6. Stage II decubitus ulcer on sacrum. 7. Anemia of chronic disease 8. Chronic pain on fentanyl patch 9. Nutrition.  Tube feeding as per dietary.  Code Status:     Code Status Orders  (From admission, onward)        Start     Ordered   10/28/17 1707  Limited resuscitation (code)  Continuous    Question Answer Comment  In the event of cardiac or respiratory ARREST: Initiate Code Blue, Call Rapid Response No   In the event of cardiac or respiratory ARREST: Perform CPR No   In the event of cardiac or respiratory ARREST: Perform Intubation/Mechanical Ventilation Yes   In the event of cardiac or respiratory ARREST: Use NIPPV/BiPAp only if indicated No   In the event of cardiac or respiratory ARREST: Administer ACLS medications if indicated No   In the event of cardiac or respiratory ARREST: Perform Defibrillation or Cardioversion if indicated No   Comments Continue trach and ventilator. No resusitation if heart stops.      10/28/17 1711    Code Status History    Date Active Date Inactive Code Status Order ID Comments User Context   09/30/2017 1945 10/28/2017 1711 Full Code [id number]  [name], MD Inpatient   08/20/2017 1713 08/24/2017 1743 Full Code [id number]  Dustin Flock, MD Inpatient   06/29/2017 0625 07/02/2017 2008 Full Code [id number]  Harrie Foreman, MD Inpatient   02/23/2017 0241 02/26/2017 1634 Full Code [id number]  Harrie Foreman, MD Inpatient   01/01/2017 1144 01/04/2017 1354 Full Code [id number]  Bettey Costa, MD Inpatient   05/22/2016 2155 05/27/2016 1645 Full Code [id number]  Fritzi Mandes, MD Inpatient   04/23/2016 2020 04/27/2016 1848 Full Code [id number]  Hower, Aaron Mose, MD ED   02/07/2016 1922 02/10/2016 1835 Full Code  [id number]  Gladstone Lighter, MD Inpatient   11/29/2015 0519 12/01/2015 1609 Full Code [id number]  Harrie Foreman, MD Inpatient   11/05/2015 0911 11/07/2015 1606 Full Code [id number]  Hillary Bow, MD ED   10/24/2014 1522 10/27/2014 1630 Full Code [id number]  Hillary Bow, MD Inpatient     Family Communication: As per critical care specialist Disposition Plan: Care manager working on plan  Consultants:  Critical care specialist  Time spent: 22 minutes  [place]

## 2017-12-24 NOTE — Progress Notes (Signed)
RT placed patient on her Trilogy vent at 1245.  O2 sats in low 90s, pt reported after about 30 minutes that she needed RT because "something's not right."  Writing RN assured her that her VS were good and tried to calm her but pt insisted.  RT found nothing amiss and patient appears calmer at this time and amenable to remaining on Trilogy for now.

## 2017-12-24 NOTE — Progress Notes (Signed)
Nutrition Follow-up  DOCUMENTATION CODES:   Non-severe (moderate) malnutrition in context of chronic illness  INTERVENTION:  Continue Jevity 1.5 Cal at 35 mL/hr (840 mL goal daily volume) + Pro-Stat 30 mL BID per G-tube. Provides 1460 kcal, 84 grams of protein, 18.5 grams of fiber, 638 mL H2O daily.  Continue liquid MVI daily per tube.  With free water flush 50 mL Q8hrs patient is receiving a total of 788 mL H2O including water in tube feeding. She will also receive approximately 180 mL fluid with Pro-Stat provision.  Will review tube feeding regimen with patient and daughters prior to discharge.  NUTRITION DIAGNOSIS:   Moderate Malnutrition related to chronic illness(COPD, CHF, hepatitis C, cocaine abuse) as evidenced by mild fat depletion, moderate fat depletion, mild muscle depletion, moderate muscle depletion.  Ongoing - addressing with TF regimen.  GOAL:   Provide needs based on ASPEN/SCCM guidelines  Met with Tf regimen.  MONITOR:   Vent status, Labs, Weight trends, TF tolerance, I & O's  REASON FOR ASSESSMENT:   Ventilator    ASSESSMENT:   57 year old female with PMHx of emphysema, COPD, HTN, hypercholesterolemia, hx pneumothorax s/p chest tube insertion, CAD s/p cardiac catheterization, cocaine abuse, tobacco abuse, asthma, CHF, anxiety, hepatitis C admitted with acute exacerbation of COPD, acute on chronic respiratory failure requiring intubation 4/29, PNA, cocaine intoxication, AKI due to renal hypoperfusion.   -Patient is s/p tracheostomy tube placement on 5/16. OGT was replaced with NGT. -Patient s/p placement of 20 Fr. Pull-through gastrostomy tube on 5/30 by IR. -Per [place] RN documentationon 6/12the device related pressure injury at trach has now healed as no open wounds were found. -Plan is for patient to discharge home on vent when authorization complete. Per note from today may be 7/30 or 7/31.  Patient remains ventilated through tracheostomy collar. At  time of RD assessment patient was on unit ventilator on PRVC mode with FiO2 40%. Plan is for her to go back on home Trilogy vent today. Patient now with low-grade fever and increased purulent secretions concerning for new purulent bronchitis vs PNA. Edema is improving and weight is trending down.  Access: 20 Fr. G-tube placed on 5/30 by IR  TF: pt tolerating Jevity 1.5 Cal at 35 mL/hr + Pro-Stat 30 mL BID; per pump history patient received 765 mL tube feeds in the past 24 hrs (91.1% goal daily volume)  Patient is currently intubated on ventilator support Ve: 11.6 L/min Temp (24hrs), Avg:99.9 F (37.7 C), Min:98.7 F (37.1 C), Max:101.7 F (38.7 C)  Propofol: N/A  Medications reviewed and include: clonazepam, doxycycline per tube, famotidine per tube, fentanyl patch, free water flush 50 mL Q8hrs, magnesium oxide 400 mg daily per tube, liquid MVI daily per tube, potassium chloride 20 mEq BID, Seroquel, Florastor, senna-docusate.  Labs reviewed: WBC 11.3 (up from 7.5 on 7/19).  I/O: 725 mL UOP yesterday (0.6 mL/kg/hr)  Weight trend: 48.4 kg on 7/21; wt trending down and now only +2.7 kg from admission wt  Discussed with RN and on rounds.  Diet Order:   Diet Order    None      EDUCATION NEEDS:   No education needs have been identified at this time  Skin:  Skin Assessment: Skin Integrity Issues: Skin Integrity Issues:: Stage II, Other (Comment) Stage II: medial sacrum (epithelialized/intact) Incisions: N/A Other: MSAD to groin  Last BM:  12/24/2017 - medium type 6  Height:   Ht Readings from Last 1 Encounters:  10/01/17 _0  (1.448 m)  Weight:   Wt Readings from Last 1 Encounters:  12/22/17 106 lb 11.2 oz (48.4 kg)    Ideal Body Weight:  43.2 kg  BMI:  Body mass index is 23.09 kg/m.  Estimated Nutritional Needs:   Kcal:  1150-1370 (25-30 kcal/kg)  Protein:  70-93 grams (1.5-2 grams/kg)  Fluid:  1.4-1.6 L/day (30-35 mL/kg)  Willey Blade, MS, RD,  LDN Office: [phone] Pager: [phone] After Hours/Weekend Pager: [phone]

## 2017-12-24 NOTE — Progress Notes (Signed)
Tube Feeding Schedule for [employer]  Tube Feeding Formula: Jevity 1.5 Cal?    Provide Jevity 1.5 Cal at 35 ml/hr continuously over 24 hrs via feeding pump (840 ml goal volume daily; approximately 3.5 cans of Jevity 1.5 Cal)  ?  Do not lie flat during feeding. Remain upright at 30-45 degree angle.??   Provide Pro-Stat (or ProMod) 30 mL BID via G-tube.  1. Flush feeding tube with 30 mL water. 2. Pour 30 mL of Pro-Stat in a 4-6 fl oz container. 3. Add 30 mL water and mix well.  4. Administer Pro-Stat via syringe.  5. Flush with 30 mL of water.  Provide liquid MVI daily per G-tube.   Flush feeding tube with 50 mL water every 8 hours (3 times per day). Flush with 30 mL of water before and after medication administration. Water flushes should be at room temperature.?  ?  Please wash hands before and after each feeding is administered.?   Opened bottles of unused feeding can be covered and placed in refrigerator for use at next feeding (up to 24 hours). Bring to room temperature before use.  ?  **If not tolerating Jevity 1.5 Cal, recommend speaking with RD/MD regarding changing formula to something you can tolerate better  Willey Blade, MS, RD, LDN Office: [phone] Pager: [phone] After Hours/Weekend Pager: [phone]

## 2017-12-25 MED ORDER — CHLORHEXIDINE GLUCONATE 0.12 % MT SOLN
OROMUCOSAL | Status: AC
  Administered 2017-12-25: 15 mL via OROMUCOSAL
  Filled 2017-12-25: qty 15

## 2017-12-25 NOTE — Progress Notes (Signed)
Patient ID: Michelle [name], female   DOB: [birth year], 57 y.o.   MRN: [id number]  [employer] PROGRESS NOTE  Michelle [name] HLK:[id number] DOB: [birth year] DOA: 09/30/2017 PCP: [facility], [facility]  HPI/Subjective: Patient breathing okay.  Still with some shortness of breath.  Has some abdominal pain.  Objective: Vitals:   12/25/17 1400 12/25/17 1547  BP: 101/77   Pulse: 94   Resp: 18   Temp:    SpO2: 97% 97%    Filed Weights   12/18/17 0450 12/22/17 0400 12/25/17 0400  Weight: 51.2 kg (112 lb 14 oz) 48.4 kg (106 lb 11.2 oz) 49.5 kg (109 lb 2 oz)    ROS: Review of Systems  Unable to perform ROS: Acuity of condition  Respiratory: Positive for shortness of breath.   Cardiovascular: Negative for chest pain.  Gastrointestinal: Positive for abdominal pain and diarrhea.   Exam: Physical Exam  HENT:  Nose: No mucosal edema.  Mouth/Throat: No oropharyngeal exudate or posterior oropharyngeal edema.  Eyes: Pupils are equal, round, and reactive to light. Conjunctivae and lids are normal.  Neck: No JVD present. Carotid bruit is not present. No edema present. No thyroid mass and no thyromegaly present.  Cardiovascular: S1 normal and S2 normal. Exam reveals no gallop.  No murmur heard. Pulses:      Dorsalis pedis pulses are 2+ on the right side, and 2+ on the left side.  Respiratory: No respiratory distress. She has no wheezes. She has rhonchi in the right lower field and the left lower field. She has no rales.  GI: Soft. Bowel sounds are normal. There is no tenderness.  Musculoskeletal:       Right ankle: She exhibits no swelling.       Left ankle: She exhibits no swelling.  Lymphadenopathy:    She has no cervical adenopathy.  Neurological: She is alert. No cranial nerve deficit.  Able to lift arms up off the bed including elbows bilaterally.  Able to straight leg raise.  Able to flex and extend at the ankles.  Skin: Skin is warm. Nails show no clubbing.  Psychiatric:  She has a normal mood and affect.      Data Reviewed: Basic Metabolic Panel: Recent Labs  Lab 12/19/17 0509 12/20/17 0422  NA 136 137  K 4.0 4.0  CL 103 104  CO2 27 28  GLUCOSE 109* 106*  BUN 23* 21*  CREATININE 0.42* 0.35*  CALCIUM 8.6* 8.8*  MG  --  1.6*   Liver Function Tests: Recent Labs  Lab 12/20/17 0422  AST 27  ALT 11  ALKPHOS 47  BILITOT 0.4  PROT 6.9  ALBUMIN 2.5*   CBC: Recent Labs  Lab 12/19/17 0509 12/20/17 0422 12/24/17 1118  WBC 8.8 7.5 11.3*  NEUTROABS 6.2 4.6  --   HGB 8.8* 9.0* 9.4*  HCT 26.5* 28.5* 29.1*  MCV 84.1 84.8 81.5  PLT 282 313 335     Scheduled Meds: . budesonide (PULMICORT) nebulizer solution  0.25 mg Nebulization BID  . chlorhexidine gluconate (MEDLINE KIT)  15 mL Mouth Rinse BID  . clonazePAM  0.5 mg Per Tube Daily  . clonazePAM  1 mg Per Tube QHS  . doxycycline  100 mg Per Tube Q12H  . enoxaparin (LOVENOX) injection  40 mg Subcutaneous Q24H  . famotidine  20 mg Per Tube Daily  . feeding supplement (PRO-STAT SUGAR FREE 64)  30 mL Per Tube BID  . fentaNYL  25 mcg Transdermal Q72H  . free  water  50 mL Per Tube Q8H  . ipratropium-albuterol  3 mL Nebulization Q6H  . magnesium oxide  400 mg Per Tube Daily  . mouth rinse  15 mL Mouth Rinse 10 times per day  . multivitamin  15 mL Per Tube Daily  . PARoxetine  20 mg Per Tube QHS  . potassium chloride  20 mEq Per Tube BID  . QUEtiapine  25 mg Per Tube QHS  . saccharomyces boulardii  250 mg Oral BID  . senna-docusate  1 tablet Per Tube BID   Continuous Infusions: . feeding supplement (JEVITY 1.5 CAL/FIBER) 1,000 mL (12/24/17 2213)    Assessment/Plan:  1. Acute on chronic hypoxic respiratory failure.  Status post tracheostomy.  Patient on ventilator. 2. Fever with ventilator associated pneumonia.   Critical care team started doxycycline. 3. Critical illness polyneuropathy.  Patient now able to lift her arms up off the bed and straight leg raise.  I advised the patient to  continue moving her arms and legs and trying to get stronger. 4. Severe anxiety on Xanax Klonopin Paxil and Seroquel 5. COPD.  Continue nebulizer treatments 6. Stage II decubitus ulcer on sacrum. 7. Anemia of chronic disease 8. Chronic pain on fentanyl patch 9. Nutrition.  Tube feeding as per dietary.  Code Status:     Code Status Orders  (From admission, onward)        Start     Ordered   10/28/17 1707  Limited resuscitation (code)  Continuous    Question Answer Comment  In the event of cardiac or respiratory ARREST: Initiate Code Blue, Call Rapid Response No   In the event of cardiac or respiratory ARREST: Perform CPR No   In the event of cardiac or respiratory ARREST: Perform Intubation/Mechanical Ventilation Yes   In the event of cardiac or respiratory ARREST: Use NIPPV/BiPAp only if indicated No   In the event of cardiac or respiratory ARREST: Administer ACLS medications if indicated No   In the event of cardiac or respiratory ARREST: Perform Defibrillation or Cardioversion if indicated No   Comments Continue trach and ventilator. No resusitation if heart stops.      10/28/17 1711    Code Status History    Date Active Date Inactive Code Status Order ID Comments User Context   09/30/2017 1945 10/28/2017 1711 Full Code [id number]  [name], MD Inpatient   08/20/2017 1713 08/24/2017 1743 Full Code [id number]  Dustin Flock, MD Inpatient   06/29/2017 0625 07/02/2017 2008 Full Code [id number]  Harrie Foreman, MD Inpatient   02/23/2017 0241 02/26/2017 1634 Full Code [id number]  Harrie Foreman, MD Inpatient   01/01/2017 1144 01/04/2017 1354 Full Code [id number]  Bettey Costa, MD Inpatient   05/22/2016 2155 05/27/2016 1645 Full Code [id number]  Fritzi Mandes, MD Inpatient   04/23/2016 2020 04/27/2016 1848 Full Code [id number]  Hower, Aaron Mose, MD ED   02/07/2016 1922 02/10/2016 1835 Full Code [id number]  Gladstone Lighter, MD Inpatient   11/29/2015 0519 12/01/2015 1609 Full Code  [id number]  Harrie Foreman, MD Inpatient   11/05/2015 0911 11/07/2015 1606 Full Code [id number]  Hillary Bow, MD ED   10/24/2014 1522 10/27/2014 1630 Full Code [id number]  Hillary Bow, MD Inpatient     Family Communication: As per critical care specialist Disposition Plan: Care manager working on plan.  Nursing staff states that disposition could be on the 31st.  Consultants:  Critical care specialist  Time spent: 24 minutes  [place]

## 2017-12-25 NOTE — Progress Notes (Signed)
CRITICAL CARE NOTE  CC  follow up respiratory failure secondary to severe COPD  SUBJECTIVE 57 year old female with respiratory failure secondary to severe COPD. Patient continues to be on ventilator, tracheostomy status. Patient with increased production/change in sputum yesterday along with fever on 7/22. Started on doxycycline. Patient afebrile overnight.      BP (!) 81/56   Pulse 83   Temp 98.2 F (36.8 C) (Oral)   Resp 17   Ht 4\' 9"  (1.448 m)   Wt 49.5 kg (109 lb 2 oz)   LMP 03/06/2005 (Approximate)   SpO2 92%   BMI 23.61 kg/m    REVIEW OF SYSTEMS  PATIENT IS UNABLE TO PROVIDE COMPLETE REVIEW OF SYSTEM S DUE TO TRACHEOSTOMY STATUS   PHYSICAL EXAMINATION:  GENERAL:critically ill appearing, -resp distress HEAD: Normocephalic, atraumatic.  EYES: Pupils equal, round, reactive to light.  No scleral icterus.  MOUTH: Moist mucosal membrane. NECK: Supple. No thyromegaly. No nodules. No JVD.  PULMONARY: clear to auscultation anteriorly/bilaterally CARDIOVASCULAR: S1 and S2. Regular rate and rhythm. No murmurs, rubs, or gallops. 2+ dorsalis pedis pulses bilaterally GASTROINTESTINAL: Soft, nontender, -distended. No masses. Positive bowel sounds. No hepatosplenomegaly.  MUSCULOSKELETAL: No swelling, clubbing, or edema.  NEUROLOGIC: obtunded, GCS<8 SKIN:intact,warm,dry  ASSESSMENT AND PLAN SYNOPSIS  57 year old female with respiratory failure secondary to severe COPD. Expected discharge to home is July 30 per case management.   Severe Respiratory failure secondary to COPD -continue Full MV support -continue Bronchodilator Therapy -Wean to trach collar as tolerated -CXR 7/23: with atelectasis vs infiltrate of right middle lobe- this is unchanged from previous CXR  NEUROLOGY - alert and oriented -continue clonazepam and quetiapine. PRN xanax for anxiety -paxil per psychiatry -fentanyl patch and PRN percocet for pain  CARDIAC -ICU monitoring -no current  concerns  GI: -g tube status -doing well with feeds  ID -patient afebrile overnight -started on doxycyline 7/24, continue -WBC with slight bump 7.5--> 11.3 -CXR 7/23: with atelectasis vs infiltrate of right middle lobe- this is unchanged from previous CXR  DVT: lovenox GI: pepcid  TRANSFUSIONS AS NEEDED MONITOR FSBS ASSESS the need for LABS as needed   Critical Care Time devoted to patient care services described in this note is 25 minutes.   Overall, patient is critically ill, prognosis is guarded.  Patient with Multiorgan failure and at high risk for cardiac arrest and death.

## 2017-12-25 NOTE — Care Management (Signed)
RNCM met with patient to update her on discharge plan to home on December 31, 2017. She smiled and shook her head "yes" in response that she was ready to go home.  I talked to her about the importance of keeping her mouth and trach area clean. I showed her my scar where I had a tracheotomy years ago. I shared with her my memory of the gagging and discomfort it caused during care.  I also shared with her the risks of infection if she did not allow it to be cleaned. She again shook her head "yes" agreeing with me.  I explained that both of her daughters will be coming in to provide this care to allow the nurse to assess their ability to perform this duty.  She again agreed. RNCM will follow. I explained that she would need to continue exercises when she returns to home.  Patient appeared alert and comfortable. Feeding tube regimen changed and has been updated in DME orders for [facility]- Dr. Mortimer Fries updated also.

## 2017-12-25 NOTE — Progress Notes (Signed)
OT Cancellation Note  Patient Details Name: Michelle [name] MRN: [id number] DOB: [birth year]   Cancelled Treatment:    Reason Eval/Treat Not Completed: Other (comment). Pt with respiratory/RN staff in room with curtain closed upon attempt. Will re-attempt at later as pt is available and medically appropriate.   Jeni Salles, MPH, MS, OTR/L ascom [phone] 12/25/17, 3:05 PM

## 2017-12-25 NOTE — Progress Notes (Signed)
Physical Therapy Treatment Patient Details Name: Michelle [name] MRN: [id number] DOB: 08-02-1960 Today's Date: 12/25/2017    History of Present Illness 57 y/o F who came to [facility] in late April with severe SOB with COPD exacerbation.  Chest x-ray was clear, patient was intubated and admitted to the ICU.  Trach tube placed 5/16.  Pt demonstrated seizure like activity on 5/23, MRI negative, EEG abnormal but no epileptiform activity.  Pt's PMH includes cocaine abuse, COPD, CAD, hepatitis C, CHF.  Multiple attempts to wean off vent have all been unsuccessful    PT Comments    Pt agreeable to PT; denies pain. Pt participates in supine exercises well with assist as needed. Demonstrating improved quad strength; glute strength noted to be quite weak with isometric exercise with total fatigue after 10 repetitions. Mild increase in respirations from 17 to 21-23 per minute with exercises. Pt able to demonstrate eccentric control during SAQ and tolerates mild resist with hip/knee extension during heel slides. Encouraged repeating exercises throughout the day to increase strength in preparation for stand/ambulation. Pt fatigued post session, but would like to attempt stand at a later time. Continue to progress to improve overall functional mobility.    Follow Up Recommendations  SNF(plan is for pt to go home 12/31/17)     Equipment Recommendations       Recommendations for Other Services       Precautions / Restrictions Precautions Precautions: Fall Restrictions Weight Bearing Restrictions: No    Mobility  Bed Mobility                  Transfers                    Ambulation/Gait                 Stairs             Wheelchair Mobility    Modified Rankin (Stroke Patients Only)       Balance                                            Cognition Arousal/Alertness: Awake/alert Behavior During Therapy: WFL for tasks assessed/performed Overall  Cognitive Status: Within Functional Limits for tasks assessed                                        Exercises General Exercises - Lower Extremity Ankle Circles/Pumps: AAROM;Both;20 reps;Supine(single side at a time) Quad Sets: Strengthening;Both;20 reps;Supine Gluteal Sets: Strengthening;Both;10 reps;Supine(weak) Short Arc Quad: AROM;Both;20 reps;Supine(focus on hold and eccentric control) Heel Slides: AROM;Strengthening;Both;20 reps;Supine(manual resist for ext) Hip ABduction/ADduction: Both;20 reps;Supine;AROM;AAROM(assist to hold legs up) Straight Leg Raises: AAROM;Both;20 reps;Supine    General Comments        Pertinent Vitals/Pain Pain Assessment: No/denies pain    Home Living                      Prior Function            PT Goals (current goals can now be found in the care plan section) Progress towards PT goals: Progressing toward goals    Frequency    Min 2X/week      PT Plan Current plan remains appropriate    Co-evaluation  AM-PAC PT "6 Clicks" Daily Activity  Outcome Measure  Difficulty turning over in bed (including adjusting bedclothes, sheets and blankets)?: Unable Difficulty moving from lying on back to sitting on the side of the bed? : Unable Difficulty sitting down on and standing up from a chair with arms (e.g., wheelchair, bedside commode, etc,.)?: Unable Help needed moving to and from a bed to chair (including a wheelchair)?: Total Help needed walking in hospital room?: Total Help needed climbing 3-5 steps with a railing? : Total 6 Click Score: 6    End of Session Equipment Utilized During Treatment: Oxygen Activity Tolerance: Patient tolerated treatment well;Other (comment)(RR increase from 17 to 21) Patient left: in bed;with call bell/phone within reach;with bed alarm set   PT Visit Diagnosis: Muscle weakness (generalized) (M62.81);Difficulty in walking, not elsewhere classified (R26.2)      Time: 8022-3361 PT Time Calculation (min) (ACUTE ONLY): 30 min  Charges:  $Therapeutic Exercise: 23-37 mins                    G CodesLarae Grooms, PTA 12/25/2017, 12:05 PM

## 2017-12-26 LAB — CULTURE, RESPIRATORY W GRAM STAIN

## 2017-12-26 LAB — CULTURE, RESPIRATORY

## 2017-12-26 LAB — CREATININE, SERUM
Creatinine, Ser: 0.4 mg/dL — ABNORMAL LOW (ref 0.44–1.00)
GFR calc Af Amer: >60 mL/min (ref >60)

## 2017-12-26 NOTE — Progress Notes (Signed)
CRITICAL CARE NOTE  CC  follow up respiratory failure secondary to severe COPD  SUBJECTIVE 57 year old female with respiratory failure secondary to severe COPD. Patient continues to be on ventilator, tracheostomy status. Patient with staph aureus on respiratory culture 7/22, being treated with doxycyline. Patient continues to be afebrile.   SIGNIFICANT EVENTS None overnight.   BP 100/63   Pulse 82   Temp 98.5 F (36.9 C)   Resp 16   Ht 4\' 9"  (1.448 m)   Wt 49.5 kg (109 lb 2 oz)   LMP 03/06/2005 (Approximate)   SpO2 98%   BMI 23.61 kg/m    REVIEW OF SYSTEMS  PATIENT IS UNABLE TO PROVIDE COMPLETE REVIEW OF SYSTEM S DUE TO TRACHEOSTOMY STATUS   PHYSICAL EXAMINATION:  GENERAL:critically ill appearing, -resp distress HEAD: Normocephalic, atraumatic.  EYES: Pupils equal, round, reactive to light.  No scleral icterus.  MOUTH: Moist mucosal membrane. NECK: Supple. No thyromegaly. No nodules. No JVD. Tracheostomy status PULMONARY: clear to auscultation anteriorly CARDIOVASCULAR: S1 and S2. Regular rate and rhythm. No murmurs, rubs, or gallops.  GASTROINTESTINAL: Soft, nontender, -distended. No masses. Positive bowel sounds. No hepatosplenomegaly. G tube status MUSCULOSKELETAL: No swelling, clubbing, or edema.  NEUROLOGIC: alert and oriented x 3, following commands SKIN:intact,warm,dry  ASSESSMENT AND PLAN SYNOPSIS  57 year old female with respiratory failure secondary to severe COPD. Expected discharge to home was July 30, however medicaid has denied patient Trilogy ventilator. Case management is aware.  Severe Respiratory failure secondary to severe COPD.  -continue Full MV support -continue Bronchodilator Therapy -Wean to trach collar as tolerated   NEUROLOGY -alert and oriented -continue clonazepam and quetiapine. Prn xanax for anxiety -paxil per psychiatry -fentanyl patch and PRN percocet for pain   CARDIAC ICU monitoring - no current concerns  GI: -g  tube status -doing well with feeds  ID -patient afebrile overnight -started on doxycycline 7/24 for MSSA, continue for total of 10 days -respiratory culture: +staph aureus, pansensitive -CXR: with atelectasis vs infiltrate of right middle lobe   DVT: lovenox GI: pepcid  TRANSFUSIONS AS NEEDED MONITOR FSBS ASSESS the need for LABS as needed   Critical Care Time devoted to patient care services described in this note is 24 minutes.   Overall, patient is critically ill, prognosis is guarded.  Patient with Multiorgan failure and at high risk for cardiac arrest and death.

## 2017-12-26 NOTE — Care Management (Signed)
RNCM received text message from [name] around 1830 12/25/17 stating that she received a letter in the mail from [facility]: [place] [phone].  She left the letter for this RNCM to review. The letter states that her Trilogy/vent has been denied as requested from [facility]. It said the medical necessity could not be validated.  This may be because the patient is still in the hospital and has had the vent for more than 24 hours while in hospital. RNCM has updated Surveyor, quantity, nursing and I spoke with patient this AM.  Corene Cornea will retrieve letter today from [facility].  Maxim updated.

## 2017-12-26 NOTE — Progress Notes (Signed)
Patient ID: Michelle [name], female   DOB: May 24, 1961, 57 y.o.   MRN: [id number]  [employer] PROGRESS NOTE  Michelle [name] KCM:[id number] DOB: 28-Oct-1960 DOA: 09/30/2017 PCP: [facility]  HPI/Subjective: Patient has some abdominal pain.  Objective: Vitals:   12/26/17 1200 12/26/17 1600  BP:    Pulse:    Resp:    Temp: 98.6 F (37 C) 98.8 F (37.1 C)  SpO2:      Filed Weights   12/18/17 0450 12/22/17 0400 12/25/17 0400  Weight: 112 lb 14 oz (51.2 kg) 106 lb 11.2 oz (48.4 kg) 109 lb 2 oz (49.5 kg)    ROS: Review of Systems  Unable to perform ROS: Acuity of condition  Respiratory: Negative for shortness of breath.   Cardiovascular: Negative for chest pain.  Gastrointestinal: Positive for abdominal pain. Negative for diarrhea.   Exam: Physical Exam  HENT:  Nose: No mucosal edema.  Mouth/Throat: No oropharyngeal exudate or posterior oropharyngeal edema.  Eyes: Pupils are equal, round, and reactive to light. Conjunctivae and lids are normal.  Neck: No JVD present. Carotid bruit is not present. No edema present. No thyroid mass and no thyromegaly present.  Cardiovascular: S1 normal and S2 normal. Exam reveals no gallop.  No murmur heard. Pulses:      Dorsalis pedis pulses are 2+ on the right side, and 2+ on the left side.  Respiratory: No respiratory distress. She has no wheezes. She has rhonchi in the right lower field and the left lower field. She has no rales.  GI: Soft. Bowel sounds are normal. There is no tenderness.  Musculoskeletal:       Right ankle: She exhibits no swelling.       Left ankle: She exhibits no swelling.  Lymphadenopathy:    She has no cervical adenopathy.  Neurological: She is alert. No cranial nerve deficit.  Able to lift arms up off the bed including elbows bilaterally.  Able to straight leg raise.  Able to flex and extend at the ankles.  Skin: Skin is warm. Nails show no clubbing.  Psychiatric: She has a normal mood and  affect.      Data Reviewed: Basic Metabolic Panel: Recent Labs  Lab 12/20/17 0422 12/26/17 0458  NA 137  --   K 4.0  --   CL 104  --   CO2 28  --   GLUCOSE 106*  --   BUN 21*  --   CREATININE 0.35* 0.40*  CALCIUM 8.8*  --   MG 1.6*  --    Liver Function Tests: Recent Labs  Lab 12/20/17 0422  AST 27  ALT 11  ALKPHOS 47  BILITOT 0.4  PROT 6.9  ALBUMIN 2.5*   CBC: Recent Labs  Lab 12/20/17 0422 12/24/17 1118  WBC 7.5 11.3*  NEUTROABS 4.6  --   HGB 9.0* 9.4*  HCT 28.5* 29.1*  MCV 84.8 81.5  PLT 313 335     Scheduled Meds: . budesonide (PULMICORT) nebulizer solution  0.25 mg Nebulization BID  . chlorhexidine gluconate (MEDLINE KIT)  15 mL Mouth Rinse BID  . clonazePAM  0.5 mg Per Tube Daily  . clonazePAM  1 mg Per Tube QHS  . doxycycline  100 mg Per Tube Q12H  . enoxaparin (LOVENOX) injection  40 mg Subcutaneous Q24H  . famotidine  20 mg Per Tube Daily  . feeding supplement (PRO-STAT SUGAR FREE 64)  30 mL Per Tube BID  . fentaNYL  25 mcg Transdermal Q72H  .  free water  50 mL Per Tube Q8H  . ipratropium-albuterol  3 mL Nebulization Q6H  . magnesium oxide  400 mg Per Tube Daily  . mouth rinse  15 mL Mouth Rinse 10 times per day  . multivitamin  15 mL Per Tube Daily  . PARoxetine  20 mg Per Tube QHS  . potassium chloride  20 mEq Per Tube BID  . QUEtiapine  25 mg Per Tube QHS  . saccharomyces boulardii  250 mg Oral BID  . senna-docusate  1 tablet Per Tube BID   Continuous Infusions: . feeding supplement (JEVITY 1.5 CAL/FIBER) 1,000 mL (12/26/17 0501)    Assessment/Plan:  1. Acute on chronic hypoxic respiratory failure.  Status post tracheostomy.  Patient on ventilator. 2. Fever with ventilator associated pneumonia.   continue doxycycline. 3. Critical illness polyneuropathy.  PT. 4. Severe anxiety on Xanax Klonopin Paxil and Seroquel 5. COPD.  Continue nebulizer treatments 6. Stage II decubitus ulcer on sacrum. 7. Anemia of chronic  disease 8. Chronic pain on fentanyl patch 9. Nutrition.  Tube feeding as per dietary.  Code Status:     Code Status Orders  (From admission, onward)        Start     Ordered   10/28/17 1707  Limited resuscitation (code)  Continuous    Question Answer Comment  In the event of cardiac or respiratory ARREST: Initiate Code Blue, Call Rapid Response No   In the event of cardiac or respiratory ARREST: Perform CPR No   In the event of cardiac or respiratory ARREST: Perform Intubation/Mechanical Ventilation Yes   In the event of cardiac or respiratory ARREST: Use NIPPV/BiPAp only if indicated No   In the event of cardiac or respiratory ARREST: Administer ACLS medications if indicated No   In the event of cardiac or respiratory ARREST: Perform Defibrillation or Cardioversion if indicated No   Comments Continue trach and ventilator. No resusitation if heart stops.      10/28/17 1711    Code Status History    Date Active Date Inactive Code Status Order ID Comments User Context   09/30/2017 1945 10/28/2017 1711 Full Code [id number]  [name], MD Inpatient   08/20/2017 1713 08/24/2017 1743 Full Code [id number]  Dustin Flock, MD Inpatient   06/29/2017 0625 07/02/2017 2008 Full Code [id number]  Harrie Foreman, MD Inpatient   02/23/2017 0241 02/26/2017 1634 Full Code [id number]  Harrie Foreman, MD Inpatient   01/01/2017 1144 01/04/2017 1354 Full Code [id number]  Bettey Costa, MD Inpatient   05/22/2016 2155 05/27/2016 1645 Full Code [id number]  Fritzi Mandes, MD Inpatient   04/23/2016 2020 04/27/2016 1848 Full Code [id number]  Hower, Aaron Mose, MD ED   02/07/2016 1922 02/10/2016 1835 Full Code [id number]  Gladstone Lighter, MD Inpatient   11/29/2015 0519 12/01/2015 1609 Full Code [id number]  Harrie Foreman, MD Inpatient   11/05/2015 0911 11/07/2015 1606 Full Code [id number]  Hillary Bow, MD ED   10/24/2014 1522 10/27/2014 1630 Full Code [id number]  Hillary Bow, MD Inpatient     Family  Communication: Discussed with the patient's sister. Disposition Plan: Care manager working on plan.  Nursing staff states that disposition could be on the 31st.  Consultants:  Critical care specialist  Time spent: 28 minutes  [place]

## 2017-12-26 NOTE — Progress Notes (Signed)
Occupational Therapy Treatment Patient Details Name: Michelle [name] MRN: [id number] DOB: [birth year] Today's Date: 12/26/2017    History of present illness 57 y/o F who came to [facility] in late April with severe SOB with COPD exacerbation.  Chest x-ray was clear, patient was intubated and admitted to the ICU.  Trach tube placed 5/16.  Pt demonstrated seizure like activity on 5/23, MRI negative, EEG abnormal but no epileptiform activity.  Pt's PMH includes cocaine abuse, COPD, CAD, hepatitis C, CHF.  Multiple attempts to wean off vent have all been unsuccessful   OT comments  Pt participated in OT/PT co-tx this date in order to work on functional transfers and education/training in use of AD. Pt eager to participate. Pt remains on vent. OT and CNA assisted with bed level toileting task at start of session prior to PT's arrival. Pt was able to roll side to side with mod assist while CNA performed total assist hygiene and clothing mgt. VSS t/o toileting. Once PT arrived, pt participated in bed mobility and functional transfers with goal of standing to increase strength and activity tolerance. PT provided physical assist for sup>sidelying>sit (please see PT note for additional detail) while OT provided assist for safety with lines/leads/tubes. Pt complained of mild dizziness once sitting but is dissipated quickly. VSS t/o bed mobility. After rest break, pt eager to attempt standing. Platform step under pt's feet to improve transfer due to bed height versus pt's stature used as well as RW. Pt required mod Ax2 for sit to stand transfers. Pt was able to tolerate initial stand for 5 seconds and 2nd trial for 7 seconds. Pt requested to lay back down. VSS t/o transfers. CNA notified of pt's status including tube feed on hold and pt ready for placement of external catheter. Pt demonstrated progress from previous sessions and pt was very happy with progress today. Will continue to progress.   Follow Up Recommendations  SNF    Equipment Recommendations  3 in 1 bedside commode    Recommendations for Other Services      Precautions / Restrictions Precautions Precautions: Fall Precaution Comments: trach collar, peg tube Restrictions Weight Bearing Restrictions: No       Mobility Bed Mobility Overal bed mobility: Needs Assistance Bed Mobility: Rolling;Sit to Supine;Sidelying to Sit Rolling: Mod assist         General bed mobility comments: mod assist for rolling during toileting task, OT provided +1 assist for safety with lines/leads/tubes during sidelying>sit and sit>supine with PT (please see PT note for additional mobility detail)  Transfers Overall transfer level: Needs assistance Equipment used: Rolling walker (2 wheeled)(platform step) Transfers: Sit to/from Stand Sit to Stand: Mod assist;+2 physical assistance;From elevated surface         General transfer comment: able to tolerate standing for initial 5 seconds then 7 seconds on 2nd trial with modAx2 to perform (please see PT note for additional mobility detail)    Balance Overall balance assessment: Needs assistance Sitting-balance support: Single extremity supported;Feet unsupported Sitting balance-Leahy Scale: Fair Sitting balance - Comments: close SBA to CGA   Standing balance support: Bilateral upper extremity supported Standing balance-Leahy Scale: Poor                             ADL either performed or assessed with clinical judgement   ADL Overall ADL's : Needs assistance/impaired Eating/Feeding: NPO Eating/Feeding Details (indicate cue type and reason): peg tube currently  Toilet Transfer: Moderate assistance Toilet Transfer Details (indicate cue type and reason): unsafe to attempt; was able to perform rolling side to side for toileting task with mod assist to roll  Toileting- Clothing Manipulation and Hygiene: Bed level;Total assistance Toileting - Clothing Manipulation  Details (indicate cue type and reason): total assist for hygiene and clothing mgt             Vision Baseline Vision/History: No visual deficits Patient Visual Report: No change from baseline     Perception     Praxis      Cognition Arousal/Alertness: Awake/alert Behavior During Therapy: WFL for tasks assessed/performed Overall Cognitive Status: Within Functional Limits for tasks assessed                                          Exercises     Shoulder Instructions       General Comments Tube feed put on hold during session, CNA notified at end of session re: tube feed on hold and okay to reapply clean external catheter    Pertinent Vitals/ Pain       Pain Assessment: Faces Faces Pain Scale: Hurts a little bit Pain Location: abdomen Pain Descriptors / Indicators: Grimacing Pain Intervention(s): Limited activity within patient's tolerance;Monitored during session;Repositioned  Home Living                                          Prior Functioning/Environment              Frequency  Min 1X/week        Progress Toward Goals  OT Goals(current goals can now be found in the care plan section)  Progress towards OT goals: Progressing toward goals  Acute Rehab OT Goals Patient Stated Goal: get stronger OT Goal Formulation: With patient Time For Goal Achievement: 12/31/17 Potential to Achieve Goals: [place] Discharge plan remains appropriate;Frequency remains appropriate    Co-evaluation    PT/OT/SLP Co-Evaluation/Treatment: Yes Reason for Co-Treatment: For patient/therapist safety;To address functional/ADL transfers PT goals addressed during session: Mobility/safety with mobility;Balance;Proper use of DME;Strengthening/ROM OT goals addressed during session: Strengthening/ROM;Proper use of Adaptive equipment and DME;ADL's and self-care      AM-PAC PT "6 Clicks" Daily Activity     Outcome Measure   Help from  another person eating meals?: Total Help from another person taking care of personal grooming?: A Little Help from another person toileting, which includes using toliet, bedpan, or urinal?: Total Help from another person bathing (including washing, rinsing, drying)?: A Lot Help from another person to put on and taking off regular upper body clothing?: A Lot Help from another person to put on and taking off regular lower body clothing?: Total 6 Click Score: 10    End of Session Equipment Utilized During Treatment: Rolling walker  OT Visit Diagnosis: Muscle weakness (generalized) (M62.81);Pain;Other abnormalities of gait and mobility (R26.89)   Activity Tolerance Patient tolerated treatment well   Patient Left in bed;with call bell/phone within reach   Nurse Communication Other (comment)(tube feeds on hold, can be resumed)        Time: 0263-7858 OT Time Calculation (min): 30 min  Charges: OT General Charges $OT Visit: 1 Visit OT Treatments $Self Care/Home Management : 8-22 mins   Jeni Salles, MPH, MS, OTR/L  ascom [phone] 12/26/17, 12:27 PM

## 2017-12-26 NOTE — Progress Notes (Signed)
Physical Therapy Treatment Patient Details Name: Michelle [name] MRN: [id number] DOB: Jan 30, 1961 Today's Date: 12/26/2017    History of Present Illness 57 y/o F who came to [facility] in late April with severe SOB with COPD exacerbation.  Chest x-ray was clear, patient was intubated and admitted to the ICU.  Trach tube placed 5/16.  Pt demonstrated seizure like activity on 5/23, MRI negative, EEG abnormal but no epileptiform activity.  Pt's PMH includes cocaine abuse, COPD, CAD, hepatitis C, CHF.  Multiple attempts to wean off vent have all been unsuccessful    PT Comments    OT working with pt upon arrival.  Co-tx for 11:47-12:03 - only one unit PT billed.  Pt  Ready and motivated to try standing today.  To edge of bed with max a x 1 and +1 to manage tubes and lines.  Once sitting she was able to sit for several minutes with min guard.  Still requires close supervision due to balance and general weakness.  Large step was brought to pt's room as she has a very short stature and bed does not lower bully to get her le's on the floor.  She was able to stand x 2 - 5 seconds and 7 seconds with walker and mod a x 2.  She self terminated due to fatigue each session.  She was assisted back to supine with max a x 1 and max a x 2 to reposition in bed.  Pt tolerated session very well today with good effort despite fatigue.   Follow Up Recommendations  SNF     Equipment Recommendations       Recommendations for Other Services       Precautions / Restrictions Precautions Precautions: Fall Precaution Comments: trach collar, peg tube Restrictions Weight Bearing Restrictions: No    Mobility  Bed Mobility Overal bed mobility: Needs Assistance Bed Mobility: Supine to Sit;Sit to Supine Rolling: Mod assist   Supine to sit: Max assist Sit to supine: Max assist   General bed mobility comments: mod assist for rolling during toileting task, OT provided +1 assist for safety with lines/leads/tubes during  sidelying>sit and sit>supine with PT (please see PT note for additional mobility detail)  Transfers Overall transfer level: Needs assistance Equipment used: Rolling walker (2 wheeled) Transfers: Sit to/from Stand Sit to Stand: Mod assist;+2 physical assistance;From elevated surface         General transfer comment: able to tolerate standing for initial 5 seconds then 7 seconds on 2nd trial with modAx2.  Ambulation/Gait                 Stairs             Wheelchair Mobility    Modified Rankin (Stroke Patients Only)       Balance Overall balance assessment: Needs assistance Sitting-balance support: Single extremity supported;Feet unsupported Sitting balance-Leahy Scale: Fair Sitting balance - Comments: close SBA to CGA   Standing balance support: Bilateral upper extremity supported Standing balance-Leahy Scale: Poor                              Cognition Arousal/Alertness: Awake/alert Behavior During Therapy: WFL for tasks assessed/performed Overall Cognitive Status: Within Functional Limits for tasks assessed  Exercises      General Comments General comments (skin integrity, edema, etc.): Tube feed put on hold during session, CNA notified at end of session re: tube feed on hold and okay to reapply clean external catheter      Pertinent Vitals/Pain Pain Assessment: Faces Faces Pain Scale: Hurts a little bit Pain Location: abdomen Pain Descriptors / Indicators: Grimacing Pain Intervention(s): Limited activity within patient's tolerance;Monitored during session    Home Living                      Prior Function            PT Goals (current goals can now be found in the care plan section) Acute Rehab PT Goals Patient Stated Goal: get stronger Progress towards PT goals: Progressing toward goals    Frequency    Min 2X/week      PT Plan Current plan remains  appropriate    Co-evaluation   Reason for Co-Treatment: For patient/therapist safety;To address functional/ADL transfers PT goals addressed during session: Mobility/safety with mobility;Balance;Proper use of DME;Strengthening/ROM OT goals addressed during session: Strengthening/ROM;Proper use of Adaptive equipment and DME;ADL's and self-care      AM-PAC PT "6 Clicks" Daily Activity  Outcome Measure  Difficulty turning over in bed (including adjusting bedclothes, sheets and blankets)?: Unable Difficulty moving from lying on back to sitting on the side of the bed? : Unable Difficulty sitting down on and standing up from a chair with arms (e.g., wheelchair, bedside commode, etc,.)?: Unable Help needed moving to and from a bed to chair (including a wheelchair)?: Total Help needed walking in hospital room?: Total Help needed climbing 3-5 steps with a railing? : Total 6 Click Score: 6    End of Session Equipment Utilized During Treatment: Oxygen Activity Tolerance: Patient tolerated treatment well Patient left: in bed;with bed alarm set;with call bell/phone within reach Nurse Communication: Other (comment)       Time: 5631-4970 PT Time Calculation (min) (ACUTE ONLY): 8 min  Charges:  $Therapeutic Activity: 8-22 mins                     Chesley Noon, PTA 12/26/17, 12:47 PM

## 2017-12-27 NOTE — Progress Notes (Signed)
Patient ID: Michelle [name], female   DOB: 1960-12-02, 57 y.o.   MRN: [id number] CRITICAL CARE NOTE  CC  follow up respiratory failure secondary to severe COPD  SUBJECTIVE 57 year old female with respiratory failure secondary to severe COPD. Patient continues to be on ventilator, tracheostomy status. Patient with staph aureus on respiratory culture 7/22, being treated with doxycyline. Patient continues to be afebrile.   SIGNIFICANT EVENTS None overnight.   BP 107/70   Pulse 82   Temp 98.4 F (36.9 C) (Oral)   Resp 17   Ht 4\' 9"  (1.448 m)   Wt 109 lb 2 oz (49.5 kg)   LMP 03/06/2005 (Approximate)   SpO2 97%   BMI 23.61 kg/m    REVIEW OF SYSTEMS  PATIENT IS UNABLE TO PROVIDE COMPLETE REVIEW OF SYSTEM S DUE TO TRACHEOSTOMY STATUS   PHYSICAL EXAMINATION:  GENERAL:critically ill appearing, -resp distress HEAD: Normocephalic, atraumatic.  EYES: Pupils equal, round, reactive to light.  No scleral icterus.  MOUTH: Moist mucosal membrane. NECK: Supple. No thyromegaly. No nodules. No JVD. Tracheostomy status PULMONARY: clear to auscultation anteriorly CARDIOVASCULAR: S1 and S2. Regular rate and rhythm. No murmurs, rubs, or gallops.  GASTROINTESTINAL: Soft, nontender, -distended. No masses. Positive bowel sounds. No hepatosplenomegaly. G tube status MUSCULOSKELETAL: No swelling, clubbing, or edema.  NEUROLOGIC: alert and oriented x 3, following commands SKIN:intact,warm,dry  ASSESSMENT AND PLAN SYNOPSIS  57 year old female with respiratory failure secondary to severe COPD. Expected discharge to home was July 30, however medicaid has denied patient Trilogy ventilator. Case management is aware.  Severe Respiratory failure secondary to severe COPD.  -continue Full MV support -continue Bronchodilator Therapy -Wean to trach collar as tolerated   NEUROLOGY -alert and oriented -continue clonazepam and quetiapine. Prn xanax for anxiety -paxil per psychiatry -fentanyl patch and PRN  percocet for pain   CARDIAC ICU monitoring - no current concerns  GI: -g tube status -doing well with feeds  ID -patient afebrile overnight -started on doxycycline 7/24 for MSSA, continue for total of 10 days -respiratory culture: +staph aureus, pansensitive -CXR: with atelectasis vs infiltrate of right middle lobe   DVT: lovenox GI: pepcid  Michelle Dellen, DO

## 2017-12-27 NOTE — Progress Notes (Signed)
Patient ID: Michelle [name], female   DOB: May 22, 1961, 57 y.o.   MRN: [id number]  [employer] PROGRESS NOTE  Michelle [name] MCN:[id number] DOB: [birth year] DOA: 09/30/2017 PCP: [facility]  HPI/Subjective: Patient has no abdominal pain. On vent via trach.  Objective: Vitals:   12/27/17 1500 12/27/17 1600  BP: 118/76 (!) 132/96  Pulse: 87 96  Resp: 20 18  Temp:    SpO2: 96% 95%    Filed Weights   12/18/17 0450 12/22/17 0400 12/25/17 0400  Weight: 112 lb 14 oz (51.2 kg) 106 lb 11.2 oz (48.4 kg) 109 lb 2 oz (49.5 kg)    ROS: Review of Systems  Unable to perform ROS: Acuity of condition  Respiratory: Negative for shortness of breath.   Cardiovascular: Negative for chest pain.  Gastrointestinal: Negative for abdominal pain and diarrhea.   Exam: Physical Exam  HENT:  Nose: No mucosal edema.  Mouth/Throat: No oropharyngeal exudate or posterior oropharyngeal edema.  Eyes: Pupils are equal, round, and reactive to light. Conjunctivae and lids are normal.  Neck: No JVD present. Carotid bruit is not present. No edema present. No thyroid mass and no thyromegaly present.  Cardiovascular: S1 normal and S2 normal. Exam reveals no gallop.  No murmur heard. Pulses:      Dorsalis pedis pulses are 2+ on the right side, and 2+ on the left side.  Respiratory: No respiratory distress. She has no wheezes. She has no rhonchi. She has no rales.  GI: Soft. Bowel sounds are normal. There is no tenderness.  Musculoskeletal:       Right ankle: She exhibits no swelling.       Left ankle: She exhibits no swelling.  Lymphadenopathy:    She has no cervical adenopathy.  Neurological: She is alert. No cranial nerve deficit.  Able to lift arms up off the bed including elbows bilaterally.  Able to straight leg raise.  Able to flex and extend at the ankles.  Skin: Skin is warm. Nails show no clubbing.  Psychiatric: She has a normal mood and affect.      Data Reviewed: Basic  Metabolic Panel: Recent Labs  Lab 12/26/17 0458  CREATININE 0.40*   Liver Function Tests: No results for input(s): AST, ALT, ALKPHOS, BILITOT, PROT, ALBUMIN in the last 168 hours. CBC: Recent Labs  Lab 12/24/17 1118  WBC 11.3*  HGB 9.4*  HCT 29.1*  MCV 81.5  PLT 335     Scheduled Meds: . budesonide (PULMICORT) nebulizer solution  0.25 mg Nebulization BID  . chlorhexidine gluconate (MEDLINE KIT)  15 mL Mouth Rinse BID  . clonazePAM  0.5 mg Per Tube Daily  . clonazePAM  1 mg Per Tube QHS  . doxycycline  100 mg Per Tube Q12H  . enoxaparin (LOVENOX) injection  40 mg Subcutaneous Q24H  . famotidine  20 mg Per Tube Daily  . feeding supplement (PRO-STAT SUGAR FREE 64)  30 mL Per Tube BID  . fentaNYL  25 mcg Transdermal Q72H  . free water  50 mL Per Tube Q8H  . ipratropium-albuterol  3 mL Nebulization Q6H  . magnesium oxide  400 mg Per Tube Daily  . mouth rinse  15 mL Mouth Rinse 10 times per day  . multivitamin  15 mL Per Tube Daily  . PARoxetine  20 mg Per Tube QHS  . potassium chloride  20 mEq Per Tube BID  . QUEtiapine  25 mg Per Tube QHS  . saccharomyces boulardii  250 mg Oral  BID  . senna-docusate  1 tablet Per Tube BID   Continuous Infusions: . feeding supplement (JEVITY 1.5 CAL/FIBER) 1,000 mL (12/27/17 0500)    Assessment/Plan:  1. Acute on chronic hypoxic respiratory failure due to severe COPD.  Status post tracheostomy.  Continue ventilator. Continue nebulizer treatments 2. Fever with ventilator associated pneumonia.   continue doxycycline. 3. Critical illness polyneuropathy.  PT: SNF. 4. Severe anxiety on Xanax Klonopin Paxil and Seroquel. 5. Stage II decubitus ulcer on sacrum. 6. Anemia of chronic disease 7. Chronic pain on fentanyl patch 8. Nutrition.  Tube feeding as per dietary.  Code Status:     Code Status Orders  (From admission, onward)        Start     Ordered   10/28/17 1707  Limited resuscitation (code)  Continuous    Question Answer  Comment  In the event of cardiac or respiratory ARREST: Initiate Code Blue, Call Rapid Response No   In the event of cardiac or respiratory ARREST: Perform CPR No   In the event of cardiac or respiratory ARREST: Perform Intubation/Mechanical Ventilation Yes   In the event of cardiac or respiratory ARREST: Use NIPPV/BiPAp only if indicated No   In the event of cardiac or respiratory ARREST: Administer ACLS medications if indicated No   In the event of cardiac or respiratory ARREST: Perform Defibrillation or Cardioversion if indicated No   Comments Continue trach and ventilator. No resusitation if heart stops.      10/28/17 1711    Code Status History    Date Active Date Inactive Code Status Order ID Comments User Context   09/30/2017 1945 10/28/2017 1711 Full Code [id number]  [name], MD Inpatient   08/20/2017 1713 08/24/2017 1743 Full Code [id number]  Dustin Flock, MD Inpatient   06/29/2017 0625 07/02/2017 2008 Full Code [id number]  Harrie Foreman, MD Inpatient   02/23/2017 0241 02/26/2017 1634 Full Code [id number]  Harrie Foreman, MD Inpatient   01/01/2017 1144 01/04/2017 1354 Full Code [id number]  Bettey Costa, MD Inpatient   05/22/2016 2155 05/27/2016 1645 Full Code [id number]  Fritzi Mandes, MD Inpatient   04/23/2016 2020 04/27/2016 1848 Full Code [id number]  Hower, Aaron Mose, MD ED   02/07/2016 1922 02/10/2016 1835 Full Code [id number]  Gladstone Lighter, MD Inpatient   11/29/2015 0519 12/01/2015 1609 Full Code [id number]  Harrie Foreman, MD Inpatient   11/05/2015 0911 11/07/2015 1606 Full Code [id number]  Hillary Bow, MD ED   10/24/2014 1522 10/27/2014 1630 Full Code [id number]  Hillary Bow, MD Inpatient     Family Communication: Discussed with the patient's sister. Disposition Plan: Care manager working on plan.  Nursing staff states that disposition could be on the 31st.  Consultants:  Critical care specialist  Time spent: 28 minutes  [place]

## 2017-12-27 NOTE — Progress Notes (Signed)
Physical Therapy Treatment Patient Details Name: Michelle [name] MRN: [id number] DOB: 14-Feb-1961 Today's Date: 12/27/2017    History of Present Illness 57 y/o F who came to [facility] in late April with severe SOB with COPD exacerbation.  Chest x-ray was clear, patient was intubated and admitted to the ICU.  Trach tube placed 5/16.  Pt demonstrated seizure like activity on 5/23, MRI negative, EEG abnormal but no epileptiform activity.  Pt's PMH includes cocaine abuse, COPD, CAD, hepatitis C, CHF.  Multiple attempts to wean off vent have all been unsuccessful    PT Comments    Pt expressed interest in trying to stand from bedside again today.  She required mod-max A to perform bed mobility including rolling, supine to sit, sit to supine and scooting up in bed.  Pt was able to sit at bedside without physical assist but PT provided frequent VC's for upright posture to allow for better respiration.  Pt attempted STS with +2 max A while standing on step and with use of RW.  Pt achieved partial STS and reported difficulty "catching my breath" following.  After resting at bedside for 1-2 min, pt requested to return to bed and bed exercises were deferred due to SOB.  Pt's O2 sats remained WNL throughout.  PT student and CNA were present to assist in management of tubing throughout mobility.  Pt will continue to benefit from skilled PT with focus on strength, tolerance to activity, bed mobility, ambulation and proper use of RW.   Follow Up Recommendations  SNF     Equipment Recommendations  None recommended by PT    Recommendations for Other Services       Precautions / Restrictions Precautions Precautions: Fall Precaution Comments: trach collar, peg tube Restrictions Weight Bearing Restrictions: No    Mobility  Bed Mobility Overal bed mobility: Needs Assistance Bed Mobility: Supine to Sit;Sit to Supine Rolling: Mod assist   Supine to sit: Max assist;+2 for safety/equipment Sit to supine: +2 for  physical assistance;Total assist   General bed mobility comments: patient making attempts to actively assist with all extremities; requires total A to return to bed and scoot up.  VC's provided for body mechanics for energy conservation.  Transfers Overall transfer level: Needs assistance Equipment used: Rolling walker (2 wheeled);None(step due to height.) Transfers: Sit to/from Stand Sit to Stand: Max assist;+2 physical assistance         General transfer comment: Partial STS with pt's appearing to be SOB and stating that she is unable to catch her breath.  Pt requested to return to bed and terminate session.  Ambulation/Gait             General Gait Details: unsafe/unable   Stairs             Wheelchair Mobility    Modified Rankin (Stroke Patients Only)       Balance Overall balance assessment: Needs assistance Sitting-balance support: No upper extremity supported;Feet supported Sitting balance-Leahy Scale: Fair Sitting balance - Comments: forward flexed posture, posterior tilted pelvis (may improve with neutral/flat seating surface?)                                    Cognition Arousal/Alertness: Awake/alert Behavior During Therapy: WFL for tasks assessed/performed Overall Cognitive Status: Within Functional Limits for tasks assessed  Exercises Other Exercises Other Exercises: Discussed posture related to optimal respiration and reminded pt to correct.    General Comments        Pertinent Vitals/Pain      Home Living                      Prior Function            PT Goals (current goals can now be found in the care plan section) Acute Rehab PT Goals Patient Stated Goal: to get stronger PT Goal Formulation: With patient/family Time For Goal Achievement: 12/31/17 Potential to Achieve Goals: Fair Progress towards PT goals: Progressing toward goals     Frequency    Min 2X/week      PT Plan Current plan remains appropriate    Co-evaluation              AM-PAC PT "6 Clicks" Daily Activity  Outcome Measure  Difficulty turning over in bed (including adjusting bedclothes, sheets and blankets)?: A Lot Difficulty moving from lying on back to sitting on the side of the bed? : A Lot Difficulty sitting down on and standing up from a chair with arms (e.g., wheelchair, bedside commode, etc,.)?: A Lot Help needed moving to and from a bed to chair (including a wheelchair)?: Total Help needed walking in hospital room?: Total Help needed climbing 3-5 steps with a railing? : Total 6 Click Score: 9    End of Session Equipment Utilized During Treatment: Oxygen Activity Tolerance: Patient tolerated treatment well Patient left: in bed;with bed alarm set;with call bell/phone within reach Nurse Communication: Other (comment)       Time: 8527-7824 PT Time Calculation (min) (ACUTE ONLY): 18 min  Charges:  $Therapeutic Activity: 8-22 mins                     Roxanne Gates, PT, DPT    Roxanne Gates 12/27/2017, 4:03 PM

## 2017-12-28 NOTE — Progress Notes (Signed)
Patient ID: Michelle [name], female   DOB: 04/26/1961, 57 y.o.   MRN: [id number] CRITICAL CARE NOTE  CC  follow up respiratory failure secondary to severe COPD  SUBJECTIVE 57 year old female with respiratory failure secondary to severe COPD. Patient continues to be on ventilator, tracheostomy status. Patient with staph aureus on respiratory culture 7/22, being treated with doxycyline. Patient continues to be afebrile.   SIGNIFICANT EVENTS None overnight.   BP 98/64   Pulse 71   Temp 98.5 F (36.9 C) (Oral)   Resp 16   Ht 4\' 9"  (1.448 m)   Wt 109 lb 2 oz (49.5 kg)   LMP 03/06/2005 (Approximate)   SpO2 97%   BMI 23.61 kg/m    REVIEW OF SYSTEMS  PATIENT IS UNABLE TO PROVIDE COMPLETE REVIEW OF SYSTEM S DUE TO TRACHEOSTOMY STATUS   PHYSICAL EXAMINATION:  GENERAL:critically ill appearing, -resp distress HEAD: Normocephalic, atraumatic.  EYES: Pupils equal, round, reactive to light.  No scleral icterus.  MOUTH: Moist mucosal membrane. NECK: Supple. No thyromegaly. No nodules. No JVD. Tracheostomy status PULMONARY: clear to auscultation anteriorly CARDIOVASCULAR: S1 and S2. Regular rate and rhythm. No murmurs, rubs, or gallops.  GASTROINTESTINAL: Soft, nontender, -distended. No masses. Positive bowel sounds. No hepatosplenomegaly. G tube status MUSCULOSKELETAL: No swelling, clubbing, or edema.  NEUROLOGIC: alert and oriented x 3, following commands SKIN:intact,warm,dry  ASSESSMENT AND PLAN SYNOPSIS  57 year old female with respiratory failure secondary to severe COPD. Expected discharge to home was July 30, however medicaid has denied patient Trilogy ventilator. Case management is aware.  Severe Respiratory failure secondary to severe COPD.  -continue Full MV support -continue Bronchodilator Therapy -Wean to trach collar as tolerated   NEUROLOGY -alert and oriented -continue clonazepam and quetiapine. Prn xanax for anxiety -paxil per psychiatry -fentanyl patch and PRN  percocet for pain   CARDIAC ICU monitoring - no current concerns  GI: -g tube status -doing well with feeds  ID -patient afebrile overnight -started on doxycycline 7/24 for MSSA, continue for total of 10 days -respiratory culture: +staph aureus, pansensitive -CXR: with atelectasis vs infiltrate of right middle lobe   DVT: lovenox GI: pepcid  Michelle Dellen, DO      Patient ID: Michelle [name], female   DOB: 1960-07-12, 57 y.o.   MRN: [id number]

## 2017-12-28 NOTE — Progress Notes (Signed)
Patient ID: Michelle [name], female   DOB: [birth year], 57 y.o.   MRN: [id number]  [employer] PROGRESS NOTE  Michelle [name] XKG:[id number] DOB: [birth year] DOA: 09/30/2017 PCP: Center, [facility]  HPI/Subjective: Patient remains same on trach  Objective: Vitals:   12/28/17 1400 12/28/17 1500  BP: 111/69 100/68  Pulse: 85 80  Resp: 18 16  Temp:    SpO2: 96% 98%    Filed Weights   12/18/17 0450 12/22/17 0400 12/25/17 0400  Weight: 51.2 kg (112 lb 14 oz) 48.4 kg (106 lb 11.2 oz) 49.5 kg (109 lb 2 oz)    ROS: Review of Systems  Unable to perform ROS: Acuity of condition  Respiratory: Negative for shortness of breath.   Cardiovascular: Negative for chest pain.  Gastrointestinal: Negative for abdominal pain and diarrhea.   Exam: Physical Exam  HENT:  Nose: No mucosal edema.  Mouth/Throat: No oropharyngeal exudate or posterior oropharyngeal edema.  Eyes: Pupils are equal, round, and reactive to light. Conjunctivae and lids are normal.  Neck: No JVD present. Carotid bruit is not present. No edema present. No thyroid mass and no thyromegaly present.  Cardiovascular: S1 normal and S2 normal. Exam reveals no gallop.  No murmur heard. Pulses:      Dorsalis pedis pulses are 2+ on the right side, and 2+ on the left side.  Respiratory: No respiratory distress. She has no wheezes. She has no rhonchi. She has no rales.  GI: Soft. Bowel sounds are normal. There is no tenderness.  Musculoskeletal:       Right ankle: She exhibits no swelling.       Left ankle: She exhibits no swelling.  Lymphadenopathy:    She has no cervical adenopathy.  Neurological: She is alert. No cranial nerve deficit.  Able to lift arms up off the bed including elbows bilaterally.  Able to straight leg raise.  Able to flex and extend at the ankles.  Skin: Skin is warm. Nails show no clubbing.  Psychiatric: She has a normal mood and affect.      Data Reviewed: Basic Metabolic Panel: Recent Labs   Lab 12/26/17 0458  CREATININE 0.40*   Liver Function Tests: No results for input(s): AST, ALT, ALKPHOS, BILITOT, PROT, ALBUMIN in the last 168 hours. CBC: Recent Labs  Lab 12/24/17 1118  WBC 11.3*  HGB 9.4*  HCT 29.1*  MCV 81.5  PLT 335     Scheduled Meds: . budesonide (PULMICORT) nebulizer solution  0.25 mg Nebulization BID  . chlorhexidine gluconate (MEDLINE KIT)  15 mL Mouth Rinse BID  . clonazePAM  0.5 mg Per Tube Daily  . clonazePAM  1 mg Per Tube QHS  . doxycycline  100 mg Per Tube Q12H  . enoxaparin (LOVENOX) injection  40 mg Subcutaneous Q24H  . famotidine  20 mg Per Tube Daily  . feeding supplement (PRO-STAT SUGAR FREE 64)  30 mL Per Tube BID  . fentaNYL  25 mcg Transdermal Q72H  . free water  50 mL Per Tube Q8H  . ipratropium-albuterol  3 mL Nebulization Q6H  . magnesium oxide  400 mg Per Tube Daily  . mouth rinse  15 mL Mouth Rinse 10 times per day  . multivitamin  15 mL Per Tube Daily  . PARoxetine  20 mg Per Tube QHS  . potassium chloride  20 mEq Per Tube BID  . QUEtiapine  25 mg Per Tube QHS  . saccharomyces boulardii  250 mg Oral BID  . senna-docusate  1 tablet Per Tube BID   Continuous Infusions: . feeding supplement (JEVITY 1.5 CAL/FIBER) 1,000 mL (12/28/17 0457)    Assessment/Plan:  1. Acute on chronic hypoxic respiratory failure due to severe COPD.  Status post tracheostomy.  Continue ventilator. Continue nebulizer treatments 2. Fever with ventilator associated pneumonia.   continue doxycycline. 3. Critical illness polyneuropathy.  PT: SNF. 4. Severe anxiety on Xanax Klonopin Paxil and Seroquel. 5. Stage II decubitus ulcer on sacrum. 6. Anemia of chronic disease 7. Chronic pain on fentanyl patch 8. Nutrition.  Tube feeding as per dietary.  Code Status:     Code Status Orders  (From admission, onward)        Start     Ordered   10/28/17 1707  Limited resuscitation (code)  Continuous    Question Answer Comment  In the event of  cardiac or respiratory ARREST: Initiate Code Blue, Call Rapid Response No   In the event of cardiac or respiratory ARREST: Perform CPR No   In the event of cardiac or respiratory ARREST: Perform Intubation/Mechanical Ventilation Yes   In the event of cardiac or respiratory ARREST: Use NIPPV/BiPAp only if indicated No   In the event of cardiac or respiratory ARREST: Administer ACLS medications if indicated No   In the event of cardiac or respiratory ARREST: Perform Defibrillation or Cardioversion if indicated No   Comments Continue trach and ventilator. No resusitation if heart stops.      10/28/17 1711    Code Status History    Date Active Date Inactive Code Status Order ID Comments User Context   09/30/2017 1945 10/28/2017 1711 Full Code [id number]  [name], MD Inpatient   08/20/2017 1713 08/24/2017 1743 Full Code [id number]  Dustin Flock, MD Inpatient   06/29/2017 0625 07/02/2017 2008 Full Code [id number]  Harrie Foreman, MD Inpatient   02/23/2017 0241 02/26/2017 1634 Full Code [id number]  Harrie Foreman, MD Inpatient   01/01/2017 1144 01/04/2017 1354 Full Code [id number]  Bettey Costa, MD Inpatient   05/22/2016 2155 05/27/2016 1645 Full Code [id number]  Fritzi Mandes, MD Inpatient   04/23/2016 2020 04/27/2016 1848 Full Code [id number]  Hower, Aaron Mose, MD ED   02/07/2016 1922 02/10/2016 1835 Full Code [id number]  Gladstone Lighter, MD Inpatient   11/29/2015 0519 12/01/2015 1609 Full Code [id number]  Harrie Foreman, MD Inpatient   11/05/2015 0911 11/07/2015 1606 Full Code [id number]  Hillary Bow, MD ED   10/24/2014 1522 10/27/2014 1630 Full Code [id number]  Hillary Bow, MD Inpatient     Family Communication: Discussed with the patient's sister. Disposition Plan: Care manager working on plan.  Nursing staff states that disposition could be on the 31st.  Consultants:  Critical care specialist  Time spent: 28 minutes  Grettel Rames [facility]

## 2017-12-29 NOTE — Progress Notes (Signed)
Patient ID: RHAELYN GIRON, female   DOB: 13-Jan-1961, 57 y.o.   MRN: [id number] CRITICAL CARE NOTE  CC  follow up respiratory failure secondary to severe COPD  SUBJECTIVE 57 year old female with respiratory failure secondary to severe COPD. Patient continues to be on ventilator, tracheostomy status. Patient with staph aureus on respiratory culture 7/22, being treated with doxycyline. Patient continues to be afebrile.   SIGNIFICANT EVENTS None overnight.   BP 98/69   Pulse 80   Temp 98.8 F (37.1 C) (Oral)   Resp 16   Ht 4\' 9"  (1.448 m)   Wt 109 lb 2 oz (49.5 kg)   LMP 03/06/2005 (Approximate)   SpO2 94%   BMI 23.61 kg/m    REVIEW OF SYSTEMS  PATIENT IS UNABLE TO PROVIDE COMPLETE REVIEW OF SYSTEM S DUE TO TRACHEOSTOMY STATUS   PHYSICAL EXAMINATION:  GENERAL:critically ill appearing, -resp distress HEAD: Normocephalic, atraumatic.  EYES: Pupils equal, round, reactive to light.  No scleral icterus.  MOUTH: Moist mucosal membrane. NECK: Supple. No thyromegaly. No nodules. No JVD. Tracheostomy status PULMONARY: clear to auscultation anteriorly CARDIOVASCULAR: S1 and S2. Regular rate and rhythm. No murmurs, rubs, or gallops.  GASTROINTESTINAL: Soft, nontender, -distended. No masses. Positive bowel sounds. No hepatosplenomegaly. G tube status MUSCULOSKELETAL: No swelling, clubbing, or edema.  NEUROLOGIC: alert and oriented x 3, following commands SKIN:intact,warm,dry  ASSESSMENT AND PLAN SYNOPSIS  57 year old female with respiratory failure secondary to severe COPD. Expected discharge to home was July 30, however medicaid has denied patient Trilogy ventilator. Case management is aware.  Severe Respiratory failure secondary to severe COPD.  -continue Full MV support -continue Bronchodilator Therapy -Wean to trach collar as tolerated   NEUROLOGY -alert and oriented -continue clonazepam and quetiapine. Prn xanax for anxiety -paxil per psychiatry -fentanyl patch and PRN  percocet for pain   CARDIAC ICU monitoring - no current concerns  GI: -g tube status -doing well with feeds  ID -patient afebrile overnight -started on doxycycline 7/24 for MSSA, continue for total of 10 days -respiratory culture: +staph aureus, pansensitive -CXR: with atelectasis vs infiltrate of right middle lobe   DVT: lovenox GI: pepcid  [name], DO      Patient ID: JOLICIA DELIRA, female   DOB: 16-Feb-1961, 57 y.o.   MRN: [id number]

## 2017-12-29 NOTE — Progress Notes (Signed)
Patient ID: XIMENNA FONSECA, female   DOB: Jun 29, 1960, 57 y.o.   MRN: [id number]  [employer] PROGRESS NOTE  ISAAC LACSON BPZ:[id number] DOB: 04-21-1961 DOA: 09/30/2017 PCP: [facility], [facility]  HPI/Subjective: Patient remains same on trach no new changes  Objective: Vitals:   12/29/17 1336 12/29/17 1400  BP:  111/65  Pulse:  84  Resp:  16  Temp:    SpO2: 96% 94%    Filed Weights   12/18/17 0450 12/22/17 0400 12/25/17 0400  Weight: 51.2 kg (112 lb 14 oz) 48.4 kg (106 lb 11.2 oz) 49.5 kg (109 lb 2 oz)    ROS: Review of Systems  Unable to perform ROS: Acuity of condition  Respiratory: Negative for shortness of breath.   Cardiovascular: Negative for chest pain.  Gastrointestinal: Negative for abdominal pain and diarrhea.   Exam: Physical Exam  HENT:  Nose: No mucosal edema.  Mouth/Throat: No oropharyngeal exudate or posterior oropharyngeal edema.  Eyes: Pupils are equal, round, and reactive to light. Conjunctivae and lids are normal.  Neck: No JVD present. Carotid bruit is not present. No edema present. No thyroid mass and no thyromegaly present.  Cardiovascular: S1 normal and S2 normal. Exam reveals no gallop.  No murmur heard. Pulses:      Dorsalis pedis pulses are 2+ on the right side, and 2+ on the left side.  Respiratory: No respiratory distress. She has no wheezes. She has no rhonchi. She has no rales.  GI: Soft. Bowel sounds are normal. There is no tenderness.  Musculoskeletal:       Right ankle: She exhibits no swelling.       Left ankle: She exhibits no swelling.  Lymphadenopathy:    She has no cervical adenopathy.  Neurological: She is alert. No cranial nerve deficit.  Able to lift arms up off the bed including elbows bilaterally.  Able to straight leg raise.  Able to flex and extend at the ankles.  Skin: Skin is warm. Nails show no clubbing.  Psychiatric: She has a normal mood and affect.      Data Reviewed: Basic Metabolic Panel: Recent  Labs  Lab 12/26/17 0458  CREATININE 0.40*   Liver Function Tests: No results for input(s): AST, ALT, ALKPHOS, BILITOT, PROT, ALBUMIN in the last 168 hours. CBC: Recent Labs  Lab 12/24/17 1118  WBC 11.3*  HGB 9.4*  HCT 29.1*  MCV 81.5  PLT 335     Scheduled Meds: . budesonide (PULMICORT) nebulizer solution  0.25 mg Nebulization BID  . chlorhexidine gluconate (MEDLINE KIT)  15 mL Mouth Rinse BID  . clonazePAM  0.5 mg Per Tube Daily  . clonazePAM  1 mg Per Tube QHS  . doxycycline  100 mg Per Tube Q12H  . enoxaparin (LOVENOX) injection  40 mg Subcutaneous Q24H  . famotidine  20 mg Per Tube Daily  . feeding supplement (PRO-STAT SUGAR FREE 64)  30 mL Per Tube BID  . fentaNYL  25 mcg Transdermal Q72H  . free water  50 mL Per Tube Q8H  . ipratropium-albuterol  3 mL Nebulization Q6H  . magnesium oxide  400 mg Per Tube Daily  . mouth rinse  15 mL Mouth Rinse 10 times per day  . multivitamin  15 mL Per Tube Daily  . PARoxetine  20 mg Per Tube QHS  . potassium chloride  20 mEq Per Tube BID  . QUEtiapine  25 mg Per Tube QHS  . saccharomyces boulardii  250 mg Oral BID  .  senna-docusate  1 tablet Per Tube BID   Continuous Infusions: . feeding supplement (JEVITY 1.5 CAL/FIBER) 1,000 mL (12/28/17 0457)    Assessment/Plan:  1. Acute on chronic hypoxic respiratory failure due to severe COPD.  Status post tracheostomy.  Continue ventilator. Continue nebulizer treatments 2. Fever with ventilator associated pneumonia.   continue doxycycline. 3. Critical illness polyneuropathy.  PT: SNF. 4. Severe anxiety on Xanax Klonopin Paxil and Seroquel. 5. Stage II decubitus ulcer on sacrum. 6. Anemia of chronic disease 7. Chronic pain on fentanyl patch 8. Nutrition.  Tube feeding as per dietary.  Code Status:     Code Status Orders  (From admission, onward)        Start     Ordered   10/28/17 1707  Limited resuscitation (code)  Continuous    Question Answer Comment  In the event of  cardiac or respiratory ARREST: Initiate Code Blue, Call Rapid Response No   In the event of cardiac or respiratory ARREST: Perform CPR No   In the event of cardiac or respiratory ARREST: Perform Intubation/Mechanical Ventilation Yes   In the event of cardiac or respiratory ARREST: Use NIPPV/BiPAp only if indicated No   In the event of cardiac or respiratory ARREST: Administer ACLS medications if indicated No   In the event of cardiac or respiratory ARREST: Perform Defibrillation or Cardioversion if indicated No   Comments Continue trach and ventilator. No resusitation if heart stops.      10/28/17 1711    Code Status History    Date Active Date Inactive Code Status Order ID Comments User Context   09/30/2017 1945 10/28/2017 1711 Full Code [id number]  [name], MD Inpatient   08/20/2017 1713 08/24/2017 1743 Full Code [id number]  Dustin Flock, MD Inpatient   06/29/2017 0625 07/02/2017 2008 Full Code [id number]  Harrie Foreman, MD Inpatient   02/23/2017 0241 02/26/2017 1634 Full Code [id number]  Harrie Foreman, MD Inpatient   01/01/2017 1144 01/04/2017 1354 Full Code [id number]  Bettey Costa, MD Inpatient   05/22/2016 2155 05/27/2016 1645 Full Code [id number]  Fritzi Mandes, MD Inpatient   04/23/2016 2020 04/27/2016 1848 Full Code [id number]  Hower, Aaron Mose, MD ED   02/07/2016 1922 02/10/2016 1835 Full Code [id number]  Gladstone Lighter, MD Inpatient   11/29/2015 0519 12/01/2015 1609 Full Code [id number]  Harrie Foreman, MD Inpatient   11/05/2015 0911 11/07/2015 1606 Full Code [id number]  Hillary Bow, MD ED   10/24/2014 1522 10/27/2014 1630 Full Code [id number]  Hillary Bow, MD Inpatient     Family Communication: Discussed with the patient's sister. Disposition Plan: Care manager working on plan.  Nursing staff states that disposition could be on the 31st.  Consultants:  Critical care specialist  Time spent: 28 minutes  Honestie Kulik [facility]

## 2017-12-30 ENCOUNTER — Inpatient Hospital Stay: Payer: Medicaid Other

## 2017-12-30 LAB — COMPREHENSIVE METABOLIC PANEL
ALT: 15 U/L (ref 0–44)
AST: 26 U/L (ref 15–41)
Albumin: 2.9 g/dL — ABNORMAL LOW (ref 3.5–5.0)
Alkaline Phosphatase: 50 U/L (ref 38–126)
Anion gap: 7 (ref 5–15)
BILIRUBIN TOTAL: 0.4 mg/dL (ref 0.3–1.2)
BUN: 32 mg/dL — AB (ref 6–20)
CHLORIDE: 105 mmol/L (ref 98–111)
CO2: 30 mmol/L (ref 22–32)
CREATININE: 0.45 mg/dL (ref 0.44–1.00)
Calcium: 10 mg/dL (ref 8.9–10.3)
GFR calc Af Amer: >60 mL/min (ref >60)
Glucose, Bld: 105 mg/dL — ABNORMAL HIGH (ref 70–99)
Potassium: 4.2 mmol/L (ref 3.5–5.1)
Sodium: 142 mmol/L (ref 135–145)
Total Protein: 7.4 g/dL (ref 6.5–8.1)

## 2017-12-30 LAB — CBC
HEMATOCRIT: 28.3 % — AB (ref 35.0–47.0)
Hemoglobin: 9.1 g/dL — ABNORMAL LOW (ref 12.0–16.0)
MCH: 26 pg (ref 26.0–34.0)
MCHC: 32.2 g/dL (ref 32.0–36.0)
MCV: 80.7 fL (ref 80.0–100.0)
Platelets: 374 10*3/uL (ref 150–440)
RBC: 3.5 MIL/uL — AB (ref 3.80–5.20)
RDW: 18.4 % — ABNORMAL HIGH (ref 11.5–14.5)
WBC: 4.5 10*3/uL (ref 3.6–11.0)

## 2017-12-30 LAB — MAGNESIUM: MAGNESIUM: 1.8 mg/dL (ref 1.7–2.4)

## 2017-12-30 LAB — PHOSPHORUS: Phosphorus: 5.3 mg/dL — ABNORMAL HIGH (ref 2.5–4.6)

## 2017-12-30 MED ORDER — ACETAMINOPHEN 160 MG/5ML PO SOLN
650.0000 mg | Freq: Four times a day (QID) | ORAL | Status: DC | PRN
  Administered 2017-12-31: 650 mg
  Filled 2017-12-30 (×2): qty 20.3

## 2017-12-30 MED ORDER — ACETAMINOPHEN 325 MG PO TABS
ORAL_TABLET | ORAL | Status: AC
Start: 2017-12-30 — End: 2017-12-30
  Administered 2017-12-30: 650 mg
  Filled 2017-12-30: qty 2

## 2017-12-30 NOTE — Care Management (Addendum)
RNCM received notification from [name] that [facility] to deliver DME to home address today between 3-7P. Update at 3:39PM: patient has a nebulizer and bedside commode available for use at home. RNCM requests that RN makes sure Trilogy vent is plugged in and charging for transport home tomorrow.   Battery life 6 hours.

## 2017-12-30 NOTE — Progress Notes (Signed)
Patient ID: KESHAUNA DEGRAFFENREID, female   DOB: 02-19-1961, 57 y.o.   MRN: [id number] CRITICAL CARE NOTE  CC  follow up respiratory failure secondary to severe COPD  SUBJECTIVE 57 year old female with respiratory failure secondary to severe COPD. Patient continues to be on ventilator, tracheostomy status. Patient with staph aureus on respiratory culture 7/22, being treated with doxycyline. Patient continues to be afebrile.   SIGNIFICANT EVENTS None overnight.   BP (!) 144/99   Pulse 96   Temp 98.2 F (36.8 C) (Oral)   Resp (!) 27   Ht 4\' 9"  (1.448 m)   Wt 109 lb 2 oz (49.5 kg)   LMP 03/06/2005 (Approximate)   SpO2 93%   BMI 23.61 kg/m    REVIEW OF SYSTEMS  PATIENT IS UNABLE TO PROVIDE COMPLETE REVIEW OF SYSTEM S DUE TO TRACHEOSTOMY STATUS   PHYSICAL EXAMINATION:  GENERAL:critically ill appearing, -resp distress HEAD: Normocephalic, atraumatic.  EYES: Pupils equal, round, reactive to light.  No scleral icterus.  MOUTH: Moist mucosal membrane. NECK: Supple. No thyromegaly. No nodules. No JVD. Tracheostomy status PULMONARY: clear to auscultation anteriorly CARDIOVASCULAR: S1 and S2. Regular rate and rhythm. No murmurs, rubs, or gallops.  GASTROINTESTINAL: Soft, nontender, -distended. No masses. Positive bowel sounds. No hepatosplenomegaly. G tube status MUSCULOSKELETAL: No swelling, clubbing, or edema.  NEUROLOGIC: alert and oriented x 3, following commands SKIN:intact,warm,dry  ASSESSMENT AND PLAN SYNOPSIS  57 year old female with respiratory failure secondary to severe COPD. Expected discharge to home was July 30, however medicaid has denied patient Trilogy ventilator. Case management is aware.  Severe Respiratory failure secondary to severe COPD.  -continue Full MV support -continue Bronchodilator Therapy -Wean to trach collar as tolerated   NEUROLOGY -alert and oriented -continue clonazepam and quetiapine. Prn xanax for anxiety -paxil per psychiatry -fentanyl patch  and PRN percocet for pain   CARDIAC ICU monitoring - no current concerns  GI: -g tube status -doing well with feeds  ID -patient afebrile overnight -started on doxycycline 7/24 for MSSA, continue for total of 10 days -respiratory culture: +staph aureus, pansensitive -CXR: with atelectasis vs infiltrate of right middle lobe   DVT: lovenox GI: pepcid  [name], DO      Patient ID: WINNIE UMALI, female   DOB: 25-Nov-1960, 57 y.o.   MRN: [id number] Patient ID: SHIANA RAPPLEYE, female   DOB: 03-18-1961, 57 y.o.   MRN: [id number]

## 2017-12-30 NOTE — Progress Notes (Signed)
SATURATION QUALIFICATIONS: (This note is used to comply with regulatory documentation for home oxygen)  Patient Saturations on Room Air at Rest = 87%  Patient Saturations on Room Air while Ambulating = n/a  Please briefly explain why patient needs home oxygen: Respiratory failure.   Patient is ventilator dependent and bed bound.

## 2017-12-30 NOTE — Progress Notes (Signed)
RN taught patient's daughter about crushing medications and giving meds via PEG tube.  Daughter demonstrated understanding with teach back method.  RN also taught daughter about holding tube feeds while suctioning patient and whenever giving meds and if the head of the bed is lowered.  Daughter verbalized understanding.  [name], RRT taught patient how to perform trach care and daughter verbalized understanding.

## 2017-12-30 NOTE — Care Management (Addendum)
Discharge summary requested for transition to home via EMS at [place] on Tuesday 12/31/17. RNCM will fax this to [facility] with [facility] [phone] for review and clarification. ICU RN working on home O2 assessment; orders confirmed with Corene Cornea with [facility] and DME delivery to home today. Both [name] patient's daughters updated and agree. Per Eduard Clos with [facility], home RN will be at residence at [place]. Patient will take home vent via EMS; issues with Medicaid and home vent all clear per [facility]. Medical necessity completed for packet.

## 2017-12-30 NOTE — Progress Notes (Signed)
Patient ID: Michelle [name], female   DOB: [birth year], 57 y.o.   MRN: [id number]  [employer] PROGRESS NOTE  Michelle [name] DOB: 05-05-1961 DOA: 09/30/2017 PCP: [facility]  HPI/Subjective: No new complaint  Objective: Vitals:   12/30/17 1200 12/30/17 1300  BP: (!) 137/94 120/75  Pulse: 90 83  Resp: (!) 24 15  Temp: 98.2 F (36.8 C)   SpO2: 96% 96%    Filed Weights   12/18/17 0450 12/22/17 0400 12/25/17 0400  Weight: 51.2 kg (112 lb 14 oz) 48.4 kg (106 lb 11.2 oz) 49.5 kg (109 lb 2 oz)    ROS: Review of Systems  Unable to perform ROS: Acuity of condition  Respiratory: Negative for shortness of breath.   Cardiovascular: Negative for chest pain.  Gastrointestinal: Negative for abdominal pain and diarrhea.   Exam: Physical Exam  HENT:  Nose: No mucosal edema.  Mouth/Throat: No oropharyngeal exudate or posterior oropharyngeal edema.  Eyes: Pupils are equal, round, and reactive to light. Conjunctivae and lids are normal.  Neck: No JVD present. Carotid bruit is not present. No edema present. No thyroid mass and no thyromegaly present.  Cardiovascular: S1 normal and S2 normal. Exam reveals no gallop.  No murmur heard. Pulses:      Dorsalis pedis pulses are 2+ on the right side, and 2+ on the left side.  Respiratory: No respiratory distress. She has no wheezes. She has no rhonchi. She has no rales.  GI: Soft. Bowel sounds are normal. There is no tenderness.  Musculoskeletal:       Right ankle: She exhibits no swelling.       Left ankle: She exhibits no swelling.  Lymphadenopathy:    She has no cervical adenopathy.  Neurological: She is alert. No cranial nerve deficit.  Able to lift arms up off the bed including elbows bilaterally.  Able to straight leg raise.  Able to flex and extend at the ankles.  Skin: Skin is warm. Nails show no clubbing.  Psychiatric: She has a normal mood and affect.      Data Reviewed: Basic Metabolic  Panel: Recent Labs  Lab 12/26/17 0458 12/30/17 0445  NA  --  142  K  --  4.2  CL  --  105  CO2  --  30  GLUCOSE  --  105*  BUN  --  32*  CREATININE 0.40* 0.45  CALCIUM  --  10.0  MG  --  1.8  PHOS  --  5.3*   Liver Function Tests: Recent Labs  Lab 12/30/17 0445  AST 26  ALT 15  ALKPHOS 50  BILITOT 0.4  PROT 7.4  ALBUMIN 2.9*   CBC: Recent Labs  Lab 12/24/17 1118 12/30/17 0445  WBC 11.3* 4.5  HGB 9.4* 9.1*  HCT 29.1* 28.3*  MCV 81.5 80.7  PLT 335 374     Scheduled Meds: . budesonide (PULMICORT) nebulizer solution  0.25 mg Nebulization BID  . chlorhexidine gluconate (MEDLINE KIT)  15 mL Mouth Rinse BID  . clonazePAM  0.5 mg Per Tube Daily  . clonazePAM  1 mg Per Tube QHS  . doxycycline  100 mg Per Tube Q12H  . enoxaparin (LOVENOX) injection  40 mg Subcutaneous Q24H  . famotidine  20 mg Per Tube Daily  . feeding supplement (PRO-STAT SUGAR FREE 64)  30 mL Per Tube BID  . fentaNYL  25 mcg Transdermal Q72H  . free water  50 mL Per Tube Q8H  . ipratropium-albuterol  3 mL Nebulization Q6H  . magnesium oxide  400 mg Per Tube Daily  . mouth rinse  15 mL Mouth Rinse 10 times per day  . multivitamin  15 mL Per Tube Daily  . PARoxetine  20 mg Per Tube QHS  . potassium chloride  20 mEq Per Tube BID  . QUEtiapine  25 mg Per Tube QHS  . saccharomyces boulardii  250 mg Oral BID  . senna-docusate  1 tablet Per Tube BID   Continuous Infusions: . feeding supplement (JEVITY 1.5 CAL/FIBER) 1,000 mL (12/28/17 0457)    Assessment/Plan:  1. Acute on chronic hypoxic respiratory failure due to severe COPD.  Status post tracheostomy.  Continue ventilator. Continue nebulizer treatments 2. Fever with ventilator associated pneumonia.   continue doxycycline. 3. Critical illness polyneuropathy.  PT: SNF. 4. Severe anxiety on Xanax Klonopin Paxil and Seroquel. 5. Stage II decubitus ulcer on sacrum. 6. Anemia of chronic disease 7. Chronic pain on fentanyl patch 8. Nutrition.   Tube feeding as per dietary.  Code Status:     Code Status Orders  (From admission, onward)        Start     Ordered   10/28/17 1707  Limited resuscitation (code)  Continuous    Question Answer Comment  In the event of cardiac or respiratory ARREST: Initiate Code Blue, Call Rapid Response No   In the event of cardiac or respiratory ARREST: Perform CPR No   In the event of cardiac or respiratory ARREST: Perform Intubation/Mechanical Ventilation Yes   In the event of cardiac or respiratory ARREST: Use NIPPV/BiPAp only if indicated No   In the event of cardiac or respiratory ARREST: Administer ACLS medications if indicated No   In the event of cardiac or respiratory ARREST: Perform Defibrillation or Cardioversion if indicated No   Comments Continue trach and ventilator. No resusitation if heart stops.      10/28/17 1711    Code Status History    Date Active Date Inactive Code Status Order ID Comments User Context   09/30/2017 1945 10/28/2017 1711 Full Code [id number]  [name], MD Inpatient   08/20/2017 1713 08/24/2017 1743 Full Code [id number]  Dustin Flock, MD Inpatient   06/29/2017 0625 07/02/2017 2008 Full Code [id number]  Harrie Foreman, MD Inpatient   02/23/2017 0241 02/26/2017 1634 Full Code [id number]  Harrie Foreman, MD Inpatient   01/01/2017 1144 01/04/2017 1354 Full Code [id number]  Bettey Costa, MD Inpatient   05/22/2016 2155 05/27/2016 1645 Full Code [id number]  Fritzi Mandes, MD Inpatient   04/23/2016 2020 04/27/2016 1848 Full Code [id number]  Hower, Aaron Mose, MD ED   02/07/2016 1922 02/10/2016 1835 Full Code [id number]  Gladstone Lighter, MD Inpatient   11/29/2015 0519 12/01/2015 1609 Full Code [id number]  Harrie Foreman, MD Inpatient   11/05/2015 0911 11/07/2015 1606 Full Code [id number]  Hillary Bow, MD ED   10/24/2014 1522 10/27/2014 1630 Full Code [id number]  Hillary Bow, MD Inpatient     Family Communication: Discussed with the patient's sister. Disposition Plan:  Care manager working on plan.  Nursing staff states that disposition could be on the 31st.  Consultants:  Critical care specialist  Time spent: 28 minutes  Michelle [name]

## 2017-12-30 NOTE — Progress Notes (Signed)
Physical Therapy Treatment Patient Details Name: Michelle [name] MRN: [id number] DOB: 1961/03/20 Today's Date: 12/30/2017    History of Present Illness 57 y/o F who came to [facility] in late April with severe SOB with COPD exacerbation.  Chest x-ray was clear, patient was intubated and admitted to the ICU.  Trach tube placed 5/16.  Pt demonstrated seizure like activity on 5/23, MRI negative, EEG abnormal but no epileptiform activity.  Pt's PMH includes cocaine abuse, COPD, CAD, hepatitis C, CHF.  Multiple attempts to wean off vent have all been unsuccessful    PT Comments    Pt was able to transfer out of bed and into chair, for the first time in her CCU hospitalization. Pt was able to actively participate in bed mobility and transfer to chair. Pt required mod assist 2+ for supine to sit activity, and max assist 2+ for lateral scoot transfer ( more mobility details below). Pt tolerated the activities well, maintaining WNL BP, O2 sat, HR, and RR. Pt was also able to tolerate the activities without increase in anxiety or sense of panic, and expressed excitement with being able to sit up. Will plan toadvance pt transfer skills in future sessions. Will continue to benefit from skilled PT to address above deficits and promote optimal return to PLOF. Recommend transition to SNF upon discharge from acute hospitalization.  Follow Up Recommendations  SNF     Equipment Recommendations  None recommended by PT    Recommendations for Other Services       Precautions / Restrictions Precautions Precautions: Fall Precaution Comments: trach collar, peg tube Restrictions Weight Bearing Restrictions: No    Mobility  Bed Mobility Overal bed mobility: Needs Assistance Bed Mobility: Supine to Sit     Supine to sit: Mod assist;+2 for safety/equipment     General bed mobility comments: Pt inititiates movement, and assist throughout acitvity, pt primarily needs assistance with trunk flexion.    Transfers Overall transfer level: Needs assistance Equipment used: (Step under feet, due to pt height. ) Transfers: Lateral/Scoot Transfers          Lateral/Scoot Transfers: Max assist;+2 physical assistance General transfer comment: Pt contributes some active weight shifting through pelvis and BLE, as well as BUE support during scoot transfer. Pt requires 2+ assist for safety. Pt does not become SOB during actvity.   Ambulation/Gait             General Gait Details: unsafe/unable   Stairs             Wheelchair Mobility    Modified Rankin (Stroke Patients Only)       Balance Overall balance assessment: Needs assistance Sitting-balance support: No upper extremity supported;Feet unsupported Sitting balance-Leahy Scale: Fair Sitting balance - Comments: forward flexed posture, occasionally needs assistance from BUE or therapist to avoid posterior drift/lean       Standing balance comment: Not performed today                            Cognition Arousal/Alertness: Awake/alert Behavior During Therapy: WFL for tasks assessed/performed Overall Cognitive Status: Within Functional Limits for tasks assessed                                 General Comments: Pt is eager to engage in therapy, pt communcates needs and repsones to PT.       Exercises Other Exercises Other  Exercises: Bed mobility: Supine to sit mod assist 2+; Transfers: Lateral scoot transfer bed to chair max assist 2+.     General Comments        Pertinent Vitals/Pain Pain Assessment: No/denies pain    Home Living                      Prior Function            PT Goals (current goals can now be found in the care plan section) Acute Rehab PT Goals Patient Stated Goal: to get stronger PT Goal Formulation: With patient/family Time For Goal Achievement: 12/31/17 Potential to Achieve Goals: Good Progress towards PT goals: Progressing toward goals     Frequency    Min 2X/week      PT Plan Current plan remains appropriate    Co-evaluation              AM-PAC PT "6 Clicks" Daily Activity  Outcome Measure  Difficulty turning over in bed (including adjusting bedclothes, sheets and blankets)?: Unable Difficulty moving from lying on back to sitting on the side of the bed? : Unable Difficulty sitting down on and standing up from a chair with arms (e.g., wheelchair, bedside commode, etc,.)?: Unable Help needed moving to and from a bed to chair (including a wheelchair)?: A Lot Help needed walking in hospital room?: Total Help needed climbing 3-5 steps with a railing? : Total 6 Click Score: 7    End of Session   Activity Tolerance: Patient tolerated treatment well Patient left: with call bell/phone within reach;in chair;with chair alarm set Nurse Communication: Mobility status(Communicated pt being in chair, and assistance level needed for transfer) PT Visit Diagnosis: Muscle weakness (generalized) (M62.81);Difficulty in walking, not elsewhere classified (R26.2)     Time: 9470-9628 PT Time Calculation (min) (ACUTE ONLY): 19 min  Charges:                       Hortencia Conradi, SPT 12/30/17,1:00 PM

## 2017-12-31 ENCOUNTER — Telehealth: Payer: Self-pay | Admitting: Internal Medicine

## 2017-12-31 MED ORDER — CLONAZEPAM 0.5 MG PO TABS: 0.5000 mg | ORAL_TABLET | Freq: Every day | ORAL | 0 refills | Status: DC

## 2017-12-31 MED ORDER — BUDESONIDE 0.25 MG/2ML IN SUSP: 0.2500 mg | Freq: Two times a day (BID) | RESPIRATORY_TRACT | 12 refills | Status: DC

## 2017-12-31 MED ORDER — CLONAZEPAM 1 MG PO TABS: 1.0000 mg | ORAL_TABLET | Freq: Every day | ORAL | 0 refills | Status: DC

## 2017-12-31 MED ORDER — FAMOTIDINE 20 MG PO TABS: 20.0000 mg | ORAL_TABLET | Freq: Every day | ORAL | 3 refills | Status: DC

## 2017-12-31 MED ORDER — OXYCODONE-ACETAMINOPHEN 5-325 MG PO TABS: 1.0000 | ORAL_TABLET | Freq: Three times a day (TID) | ORAL | 0 refills | Status: DC | PRN

## 2017-12-31 MED ORDER — PAROXETINE HCL 20 MG PO TABS: 20.0000 mg | ORAL_TABLET | Freq: Every day | ORAL | 3 refills | Status: DC

## 2017-12-31 MED ORDER — IPRATROPIUM-ALBUTEROL 0.5-2.5 (3) MG/3ML IN SOLN: 3.0000 mL | Freq: Four times a day (QID) | RESPIRATORY_TRACT | 3 refills | Status: DC

## 2017-12-31 MED ORDER — FENTANYL 25 MCG/HR TD PT72: 25.0000 ug | MEDICATED_PATCH | TRANSDERMAL | 0 refills | Status: DC

## 2017-12-31 MED ORDER — QUETIAPINE FUMARATE 25 MG PO TABS: 25.0000 mg | ORAL_TABLET | Freq: Every day | ORAL | 3 refills | Status: DC

## 2017-12-31 MED ORDER — DOXYCYCLINE HYCLATE 100 MG PO TABS: 100.0000 mg | ORAL_TABLET | Freq: Two times a day (BID) | ORAL | 0 refills | Status: DC

## 2017-12-31 NOTE — Telephone Encounter (Signed)
Lyndee Leo, RN calling, currently at home of patient  Needs clarification on medication Please call to discuss ASAP as she will not be with patient much longer

## 2017-12-31 NOTE — Discharge Summary (Signed)
Physician Discharge Summary  Patient ID: Michelle [name] MRN: [id number] DOB/AGE: 56-Dec-1962 56 y.o.  Admit date: 09/30/2017 Discharge date: 12/31/2017  Admission Diagnoses: severe COPD, acute on chronic respiratory failure,ventilator dependent respiratory failure, anxiety/depression, panic disorder  Discharge Diagnoses:  Principal Problem:   Panic disorder Active Problems:   COPD exacerbation (HCC)   Acute on chronic respiratory failure with hypoxia (HCC)   Acute on chronic respiratory failure (HCC)   Pressure injury of skin   Altered mental status   Discharged Condition: stable  Hospital Course: patient with prolonged complicated hospital course, please see chart notes for full details 57 yo female with a PMH Tobacco Abuse, Pneumothorax, HTN, Hypercholesteremia, Hepatitis C, Emphysema/COPD, Cocaine Abuse, CHF, Asthma, Anxiety, and Allergies.  She presented to [facility] ER via EMS on 04/29 with respiratory distress. In the ER pt remained in severe respiratory distress in tripod position requiring mechanical intubation.  CXR and influenza pcr negative. She was subsequently admitted to ICU by hospitalist team for further workup and treatment PCCM consulted for vent management. She had a prolonged hospitalization with severe COPD, multiple episodes of pneumonia, difficulty in weaning mechanical ventilation secondary to significant anxiety component, eventually she required tracheostomy. She has done quite well since that time. She has failed multiple trials of trach collar and is now presently resting comfortably on a Trilogy ventilator.    Consults: psychiatry, otolaryngology, palliative care pulmonary/critical care internal medicine, case management  Significant Diagnostic Studies: prolonged complicated hospital course, please see radiology notes for studies performed  Discharge Exam: Blood pressure 126/87, pulse 90, temperature 98.1 F (36.7 C), temperature source Oral, resp. rate 18, height  4\' 9"  (1.448 m), weight 109 lb 2 oz (49.5 kg), last menstrual period 03/06/2005, SpO2 95 %.   GENERAL:critically ill appearing, -resp distress HEAD: Normocephalic, atraumatic.  EYES: Pupils equal, round, reactive to light.  No scleral icterus.  MOUTH: Moist mucosal membrane. NECK: Supple. No thyromegaly. No nodules. No JVD. Tracheostomy status PULMONARY: clear to auscultation anteriorly CARDIOVASCULAR: S1 and S2. Regular rate and rhythm. No murmurs, rubs, or gallops.  GASTROINTESTINAL: Soft, nontender, -distended. No masses. Positive bowel sounds. No hepatosplenomegaly. G tube status MUSCULOSKELETAL: No swelling, clubbing, or edema.  NEUROLOGIC: alert and oriented x 3, following commands SKIN:intact,warm,dry   Disposition:    Home discharge,   In particular she will require home palliative care, home private duty nursing for ventilator tracheostomy management per protocol, home exercise program which has been discussed with family with education and PEG tube management per protocol.  Medications: Please see home medicine reconciliation  Michelle Dellen, DO  Signed: [name] 12/31/2017, 8:00 AM

## 2017-12-31 NOTE — Care Management (Addendum)
Text message sent to [facility] with [facility] and to patient's daughter [name] to check on DME delivery- pending response.  RNCM will fax discharge summary to Charlie with [facility] health care for review prior to discharge this AM- completed.  EMS Lanny Hurst) has notified RNCM that EMS/ACLS truck will be available based on priority but will try to be here by 0930A as requested- not promised. Update at 0900:  Message received from [name] stating that DME had been delivered however she request that pick up be delayed until noon.  I explained that there may be a delay on EMS however not promised and based on battery for vent and need for EMS/Maxim/ICU RN coordination, I am requesting that she remain with current plan for transition- [name] agreed.

## 2017-12-31 NOTE — Progress Notes (Signed)
Nutrition Follow-up  DOCUMENTATION CODES:   Non-severe (moderate) malnutrition in context of chronic illness  INTERVENTION:  Continue Jevity 1.5 Cal at 35 mL/hr (840 mL goal daily volume) + Pro-Stat 30 mL BID via G-tube. Provides 1460 kcal, 84 grams of protein, 18.5 grams of fiber, 638 mL H2O daily.  Continue liquid MVI daily per tube.  With free water flush of 50 mL Q8hrs patient is receiving a total of 788 mL H2O including water in tube feeding. She will also receive approximately 180 mL fluid with Pro-Stat provision.  NUTRITION DIAGNOSIS:   Moderate Malnutrition related to chronic illness(COPD, CHF, hepatitis C, cocaine abuse) as evidenced by mild fat depletion, moderate fat depletion, mild muscle depletion, moderate muscle depletion.  Ongoing - addressing with TF regimen.  GOAL:   Provide needs based on ASPEN/SCCM guidelines  Met with TF regimen.  MONITOR:   Vent status, Labs, Weight trends, TF tolerance, I & O's  REASON FOR ASSESSMENT:   Ventilator    ASSESSMENT:   57 year old female with PMHx of emphysema, COPD, HTN, hypercholesterolemia, hx pneumothorax s/p chest tube insertion, CAD s/p cardiac catheterization, cocaine abuse, tobacco abuse, asthma, CHF, anxiety, hepatitis C admitted with acute exacerbation of COPD, acute on chronic respiratory failure requiring intubation 4/29, PNA, cocaine intoxication, AKI due to renal hypoperfusion.   -Patient is s/p tracheostomy tube placement on 5/16. OGT was replaced with NGT. -Patient s/p placement of 20 Fr. Pull-through gastrostomy tube on 5/30 by IR. -Per WOC RN documentationon 6/12the device related pressure injury at trach has now healed as no open wounds were found. -Plan is for discharge home today on vent.  Met with patient at bedside. She remains ventilated through tracheostomy collar on home Trilogy vent. She continues to tolerate tube feeds well. Patient no longer has edema in extremities. Patient's family has  received education on tube feeding at home.  Access: 20 Fr. G-tube placed on 5/30 by IR  TF: continues to tolerate Jevity 1.5 Cal at 35 mL/hr + Pro-Stat 30 mL BID  Patient is currently intubated on ventilator support MV: 12.3 L/min Temp (24hrs), Avg:98.4 F (36.9 C), Min:98.1 F (36.7 C), Max:98.7 F (37.1 C)  Medications reviewed and include: clonazepam, doxycycline, famotidine, fentanyl patch, free water flush 50 mL Q8hrs, magnesium oxide 400 mg daily, liquid MVI daily per tube, potassium chloride 20 mEq BID, Seroquel, Florastor, senna-docusate.  Labs reviewed.  Discussed with RN and on rounds.  Diet Order:   Diet Order    None      EDUCATION NEEDS:   No education needs have been identified at this time  Skin:  Skin Assessment: Reviewed RN Assessment Skin Integrity Issues:: Stage II, Other (Comment) Stage II: medial sacrum (epithelialized/intact) Incisions: N/A Other: MSAD to groin  Last BM:  12/31/2017 - small type 4  Height:   Ht Readings from Last 1 Encounters:  10/01/17 4' 9" (1.448 m)    Weight:   Wt Readings from Last 1 Encounters:  12/25/17 109 lb 2 oz (49.5 kg)    Ideal Body Weight:  43.2 kg  BMI:  Body mass index is 23.61 kg/m.  Estimated Nutritional Needs:   Kcal:  1150-1370 (25-30 kcal/kg)  Protein:  70-93 grams (1.5-2 grams/kg)  Fluid:  1.4-1.6 L/day (30-35 mL/kg)   Stephens, MS, RD, LDN Office: [phone] Pager: [phone] After Hours/Weekend Pager: [phone]  

## 2017-12-31 NOTE — Progress Notes (Addendum)
Patient ID: Michelle [name], female   DOB: [birth year], 57 y.o.   MRN: [id number]  [facility] PROGRESS NOTE  Michelle [name]:[id number] DOB: [birth year] DOA: 09/30/2017 PCP: [facility]  HPI/Subjective: Patient has no complaints  Objective: Vitals:   12/31/17 1138 12/31/17 1313  BP:    Pulse:    Resp:    Temp: 98.2 F (36.8 C)   SpO2:  96%    Filed Weights   12/18/17 0450 12/22/17 0400 12/25/17 0400  Weight: 51.2 kg (112 lb 14 oz) 48.4 kg (106 lb 11.2 oz) 49.5 kg (109 lb 2 oz)    ROS: Review of Systems  Unable to perform ROS: Acuity of condition  Respiratory: Negative for shortness of breath.   Cardiovascular: Negative for chest pain.  Gastrointestinal: Negative for abdominal pain and diarrhea.   Exam: Physical Exam  HENT:  Nose: No mucosal edema.  Mouth/Throat: No oropharyngeal exudate or posterior oropharyngeal edema.  Eyes: Pupils are equal, round, and reactive to light. Conjunctivae and lids are normal.  Neck: No JVD present. Carotid bruit is not present. No edema present. No thyroid mass and no thyromegaly present.  Cardiovascular: S1 normal and S2 normal. Exam reveals no gallop.  No murmur heard. Pulses:      Dorsalis pedis pulses are 2+ on the right side, and 2+ on the left side.  Respiratory: No respiratory distress. She has no wheezes. She has no rhonchi. She has no rales.  GI: Soft. Bowel sounds are normal. There is no tenderness.  Musculoskeletal:       Right ankle: She exhibits no swelling.       Left ankle: She exhibits no swelling.  Lymphadenopathy:    She has no cervical adenopathy.  Neurological: She is alert. No cranial nerve deficit.  Able to lift arms up off the bed including elbows bilaterally.  Able to straight leg raise.  Able to flex and extend at the ankles.  Skin: Skin is warm. Nails show no clubbing.  Psychiatric: She has a normal mood and affect.      Data Reviewed: Basic Metabolic Panel: Recent Labs  Lab  12/26/17 0458 12/30/17 0445  NA  --  142  K  --  4.2  CL  --  105  CO2  --  30  GLUCOSE  --  105*  BUN  --  32*  CREATININE 0.40* 0.45  CALCIUM  --  10.0  MG  --  1.8  PHOS  --  5.3*   Liver Function Tests: Recent Labs  Lab 12/30/17 0445  AST 26  ALT 15  ALKPHOS 50  BILITOT 0.4  PROT 7.4  ALBUMIN 2.9*   CBC: Recent Labs  Lab 12/30/17 0445  WBC 4.5  HGB 9.1*  HCT 28.3*  MCV 80.7  PLT 374     Scheduled Meds: . budesonide (PULMICORT) nebulizer solution  0.25 mg Nebulization BID  . chlorhexidine gluconate (MEDLINE KIT)  15 mL Mouth Rinse BID  . clonazePAM  0.5 mg Per Tube Daily  . clonazePAM  1 mg Per Tube QHS  . doxycycline  100 mg Per Tube Q12H  . enoxaparin (LOVENOX) injection  40 mg Subcutaneous Q24H  . famotidine  20 mg Per Tube Daily  . feeding supplement (PRO-STAT SUGAR FREE 64)  30 mL Per Tube BID  . fentaNYL  25 mcg Transdermal Q72H  . free water  50 mL Per Tube Q8H  . ipratropium-albuterol  3 mL Nebulization Q6H  . magnesium  oxide  400 mg Per Tube Daily  . mouth rinse  15 mL Mouth Rinse 10 times per day  . multivitamin  15 mL Per Tube Daily  . PARoxetine  20 mg Per Tube QHS  . potassium chloride  20 mEq Per Tube BID  . QUEtiapine  25 mg Per Tube QHS  . saccharomyces boulardii  250 mg Oral BID  . senna-docusate  1 tablet Per Tube BID   Continuous Infusions: . feeding supplement (JEVITY 1.5 CAL/FIBER) Stopped (12/31/17 1353)    Assessment/Plan:  1. Acute on chronic hypoxic respiratory failure due to severe COPD.  Status post tracheostomy.  Continue ventilator. Continue nebulizer treatments 2. Fever with ventilator associated pneumonia.   continue doxycycline. 3. Critical illness polyneuropathy.  PT: SNF. 4. Severe anxiety on Xanax Klonopin Paxil and Seroquel. 5. Stage II decubitus ulcer on sacrum. 6. Anemia of chronic disease 7. Chronic pain on fentanyl patch 8. Nutrition.  Tube feeding as per dietary.  Code Status:     Code Status  Orders  (From admission, onward)        Start     Ordered   10/28/17 1707  Limited resuscitation (code)  Continuous    Question Answer Comment  In the event of cardiac or respiratory ARREST: Initiate Code Blue, Call Rapid Response No   In the event of cardiac or respiratory ARREST: Perform CPR No   In the event of cardiac or respiratory ARREST: Perform Intubation/Mechanical Ventilation Yes   In the event of cardiac or respiratory ARREST: Use NIPPV/BiPAp only if indicated No   In the event of cardiac or respiratory ARREST: Administer ACLS medications if indicated No   In the event of cardiac or respiratory ARREST: Perform Defibrillation or Cardioversion if indicated No   Comments Continue trach and ventilator. No resusitation if heart stops.      10/28/17 1711    Code Status History    Date Active Date Inactive Code Status Order ID Comments User Context   09/30/2017 1945 10/28/2017 1711 Full Code [id number]  [name], MD Inpatient   08/20/2017 1713 08/24/2017 1743 Full Code [id number]  Dustin Flock, MD Inpatient   06/29/2017 0625 07/02/2017 2008 Full Code [id number]  Harrie Foreman, MD Inpatient   02/23/2017 0241 02/26/2017 1634 Full Code [id number]  Harrie Foreman, MD Inpatient   01/01/2017 1144 01/04/2017 1354 Full Code [id number]  Bettey Costa, MD Inpatient   05/22/2016 2155 05/27/2016 1645 Full Code [id number]  Fritzi Mandes, MD Inpatient   04/23/2016 2020 04/27/2016 1848 Full Code [id number]  Hower, Aaron Mose, MD ED   02/07/2016 1922 02/10/2016 1835 Full Code [id number]  Gladstone Lighter, MD Inpatient   11/29/2015 0519 12/01/2015 1609 Full Code [id number]  Harrie Foreman, MD Inpatient   11/05/2015 0911 11/07/2015 1606 Full Code [id number]  Hillary Bow, MD ED   10/24/2014 1522 10/27/2014 1630 Full Code [id number]  Hillary Bow, MD Inpatient     Family Communication: Discussed with the patient's sister. Disposition Plan: Care manager working on plan.  Discharge possible  today Consultants:  Critical care specialist  Time spent: 28 minutes  Michelle [name] [employer]

## 2017-12-31 NOTE — Progress Notes (Signed)
Patient left ICU via stretcher with [facility] staff and Tammy, RRT from [facility] riding with patient to her house.  Patient left ICU alert, waving bye to staff members with no distress noted on home trilogy ventilator. Patient discharged home.  Discharge packet sent with EMS staff.

## 2017-12-31 NOTE — Telephone Encounter (Signed)
Returned call went over med list. Mentioned hospice referral. Nothing further needed.

## 2018-01-02 ENCOUNTER — Telehealth: Payer: Self-pay | Admitting: Internal Medicine

## 2018-01-02 ENCOUNTER — Telehealth: Payer: Self-pay

## 2018-01-02 NOTE — Telephone Encounter (Signed)
EMMI Follow-up: Daughter, [name] called as she said her mother had just received a call from my number and asked her to find out why we called.  This patient is on the Future Call list and I let her know there are 2 automated calls post discharge and we are checking to see if got her Rx's filled, if she was aware of any follow-up appointments (which none noted on the AVS) and to see how she is doing.  She stated her mother got her Rx's filled, has no follow-up appointments and was doing okay.  I asked if this was the best number to reach her mother, Bunnie and she replied yes so I let her know there would be another automatied call with a different series of questions and to let us know if she had any concerns at that time. No needs noted for today.

## 2018-01-02 NOTE — Telephone Encounter (Signed)
Need to get all procedure reports from medical records at [facility]

## 2018-01-02 NOTE — Telephone Encounter (Signed)
Izora Gala from [facility] home [facility] calling stating she is needing someone to call back  She needs to know who inserted patient's GI tube. She would also need to know the size of it. She can't seem to find anything on any paperwork they have from the hospital   Please advise

## 2018-01-02 NOTE — Telephone Encounter (Signed)
Dr. Mortimer Fries has also signed [facility] consult for the patient and it was faxed today. Message for Michelle [name] is to contact [facility] medical records as this is outpatient setting. Patient has not been seen in clinic setting.

## 2018-01-02 NOTE — Telephone Encounter (Signed)
Returned call to [employer] as per [employer]

## 2018-01-03 ENCOUNTER — Telehealth: Payer: Self-pay | Admitting: Internal Medicine

## 2018-01-03 NOTE — Telephone Encounter (Signed)
Nurse needs order for pulse ox machine faxed to [place]. Fax [phone]

## 2018-01-03 NOTE — Telephone Encounter (Signed)
Returned call to [place] informed that some orders have been signed and faxed back as well as hospice palliative care. Per Izora Gala hospice has not reached out to patient. Called hospice and they called this morning around 9:10 and did not get an answer. A message was left to call back. Called patient's daughter who was at work and she advised to call he sister. Called her sister and phone went to voicemail. Attempted to contact Izora Gala back and her phone went to voicemail as well.  Left a voicemail for [place] with hospice contact is [facility] at [phone] Family needs to call to arrange. Nothing further needed.

## 2018-01-07 ENCOUNTER — Other Ambulatory Visit: Payer: Self-pay

## 2018-01-07 ENCOUNTER — Telehealth: Payer: Self-pay | Admitting: Internal Medicine

## 2018-01-07 MED ORDER — FENTANYL 25 MCG/HR TD PT72: 25.0000 ug | MEDICATED_PATCH | TRANSDERMAL | 0 refills | Status: DC

## 2018-01-07 MED ORDER — OXYCODONE-ACETAMINOPHEN 5-325 MG PO TABS: 1.0000 | ORAL_TABLET | Freq: Three times a day (TID) | ORAL | 0 refills | Status: DC | PRN

## 2018-01-07 MED ORDER — CLONAZEPAM 0.5 MG PO TABS: 0.5000 mg | ORAL_TABLET | Freq: Every day | ORAL | 0 refills | Status: DC

## 2018-01-07 NOTE — Telephone Encounter (Signed)
Pt never seen in office. Was seen by Confortie in ICU.

## 2018-01-07 NOTE — Telephone Encounter (Signed)
Are you ok to order DME pulse ox?

## 2018-01-07 NOTE — Telephone Encounter (Signed)
Needs order for pulse ox. Please address 485 form that was sent

## 2018-01-07 NOTE — Telephone Encounter (Signed)
yes

## 2018-01-07 NOTE — Telephone Encounter (Signed)
Returned call to home health [facility]. Per staff Dr. Mortimer Fries agreed to take over patient care until she is set up with Pallative care per Marshell Garfinkel hospital case manager. Dr. Mortimer Fries signed 2nd page of form 485 which has been faxed back. 1st page physician responsibility placed back in his folder for signature.  Will reach out to [facility] office on responsibility of orders as she is not a [facility] patient. Also her pcp is [facility] and she is established with [facility].

## 2018-01-08 MED ORDER — AMBULATORY NON FORMULARY MEDICATION: 0 refills | Status: DC

## 2018-01-08 NOTE — Telephone Encounter (Signed)
Rx for pulse ox faxed to [facility] per request.

## 2018-01-08 NOTE — Telephone Encounter (Signed)
Rx printed for pulse oximeter. Will need MD signature.

## 2018-01-08 NOTE — Addendum Note (Signed)
Addended by: Stephanie Coup on: 01/08/2018 08:08 AM   Modules accepted: Orders

## 2018-01-09 ENCOUNTER — Telehealth: Payer: Self-pay

## 2018-01-09 NOTE — Telephone Encounter (Signed)
This RNCM received call from [place] with [facility] health care attempting to obtain orders for PEG/g-tube frequency of changes and dressing changes; size of PEG; and when trach and PEG was last changed. She said that she spoke someone at [facility] and that because patient has never been seen at their office "they will not follow for home health orders". This is a misunderstanding per my discussion with Dr. Mortimer Fries while discharge planning with ongoing. Dr. Mortimer Fries agreed to follow for home health needs; orders obtained and sent to Dr. Mortimer Fries at that time. Per note, he will turn orders over to Palliative when they start care with patient.  RNCM will search chart for this information and fax it to [place] with [facility] to assist with meeting patient's home needs.

## 2018-04-16 IMAGING — DX DG CHEST 1V
1 series · 1 of 1 positions shown · non-contrast
Comparison: 05/26/2016

CLINICAL DATA: Shortness of Breath

EXAM:
CHEST 1 VIEW

[chest ap]
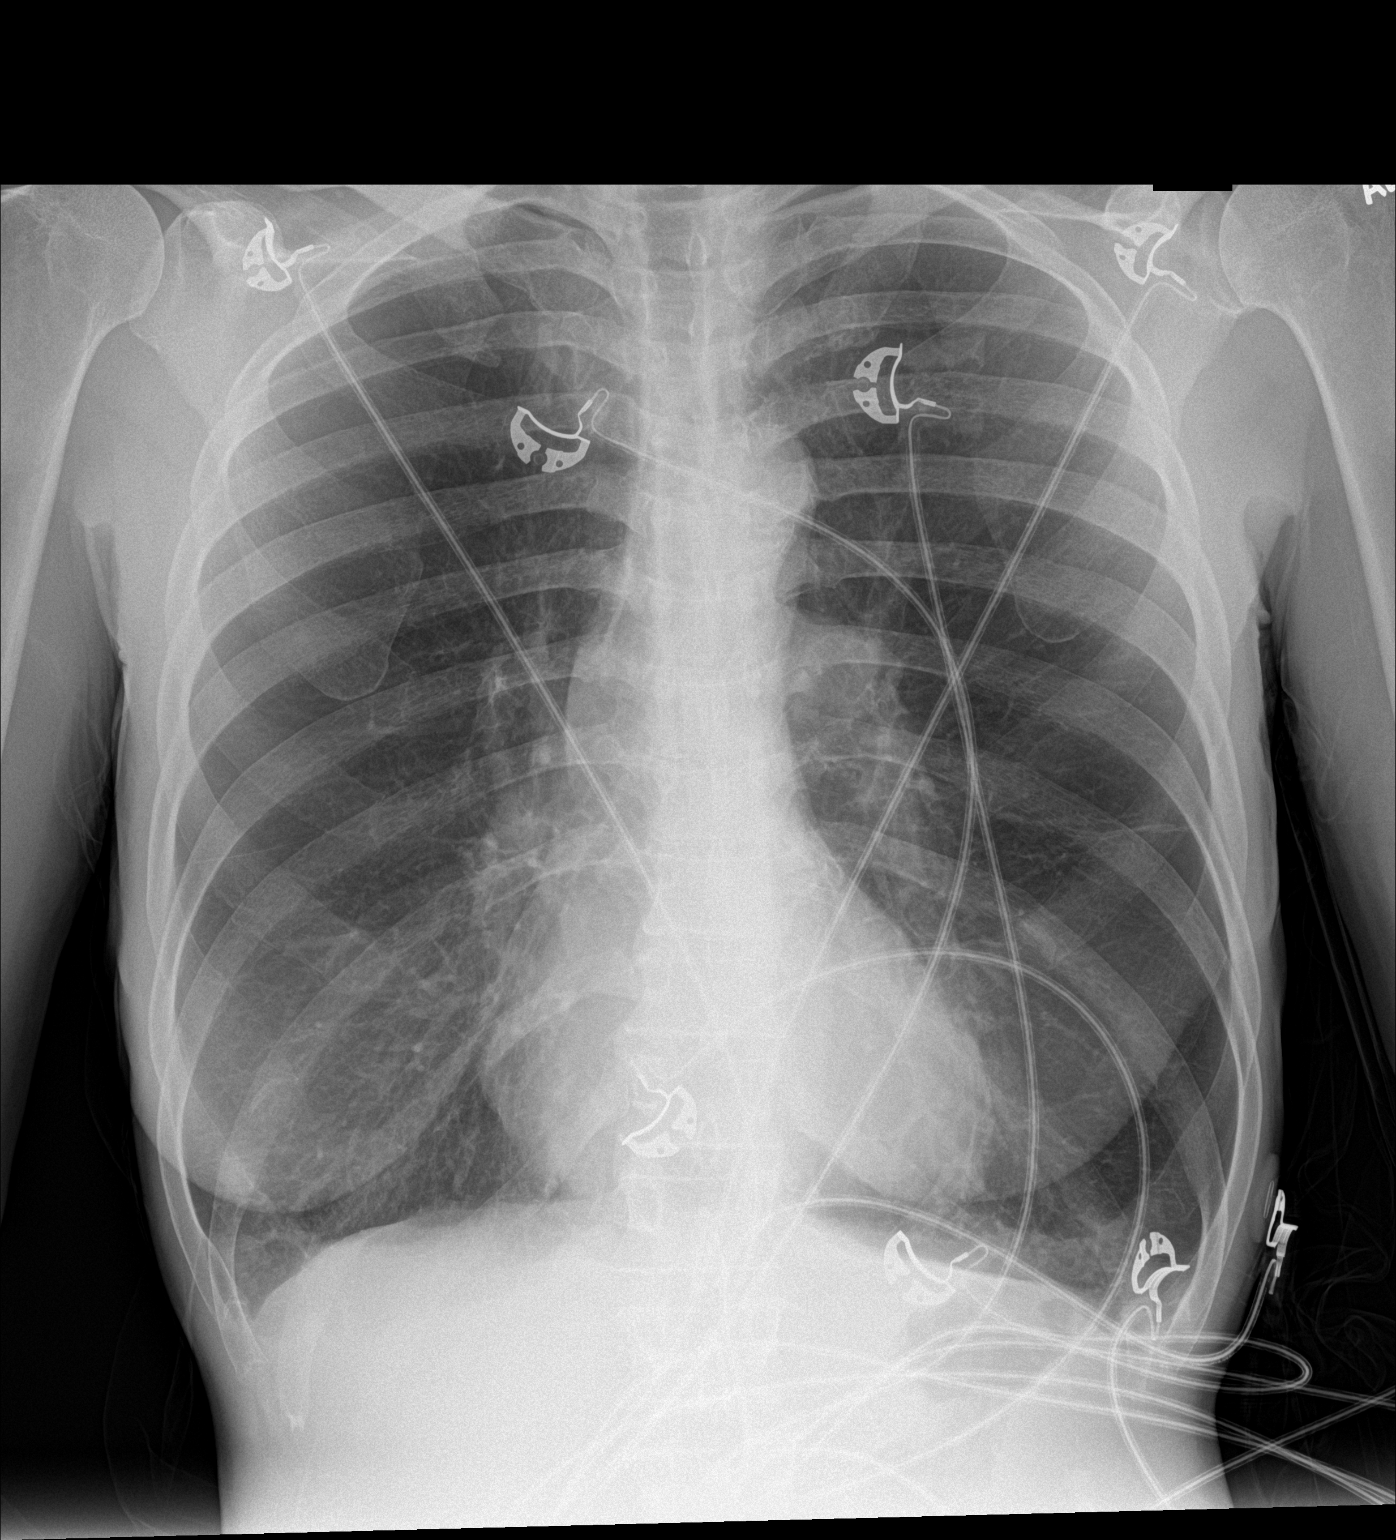

[1 of 1 positions shown; findings below may reference images not displayed]

FINDINGS: There is hyperinflation of the lungs compatible with COPD. Heart and
mediastinal contours are within normal limits. No focal opacities or
effusions. No acute bony abnormality.
IMPRESSION: COPD.  No active disease.

## 2018-05-11 IMAGING — CR DG CHEST 2V
1 series · 2 of 2 positions shown · non-contrast
Comparison: 01/01/2017

CLINICAL DATA: Dyspnea

EXAM:
CHEST  2 VIEW

[Series 1: dg chest 2 view · 0.14mm/px · 2 of 2 slices shown]
[im 1/2]
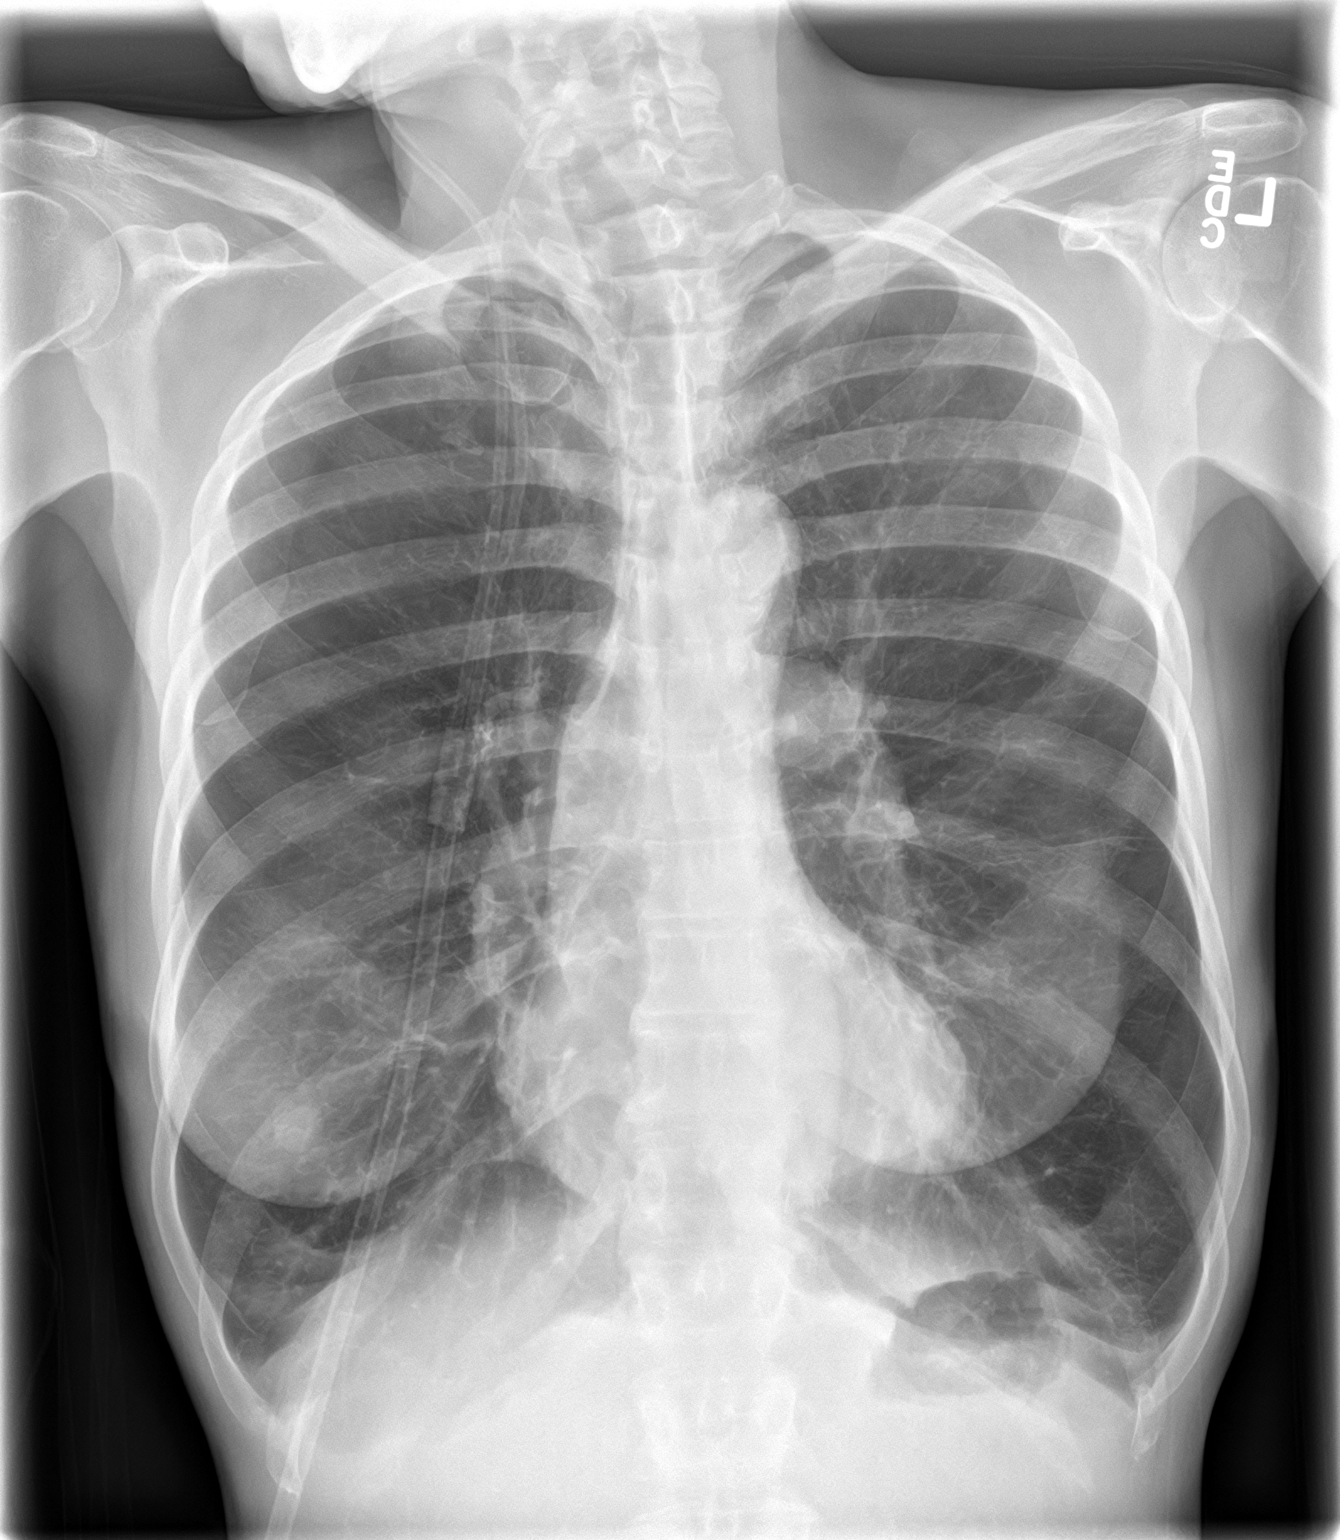
[im 2/2]
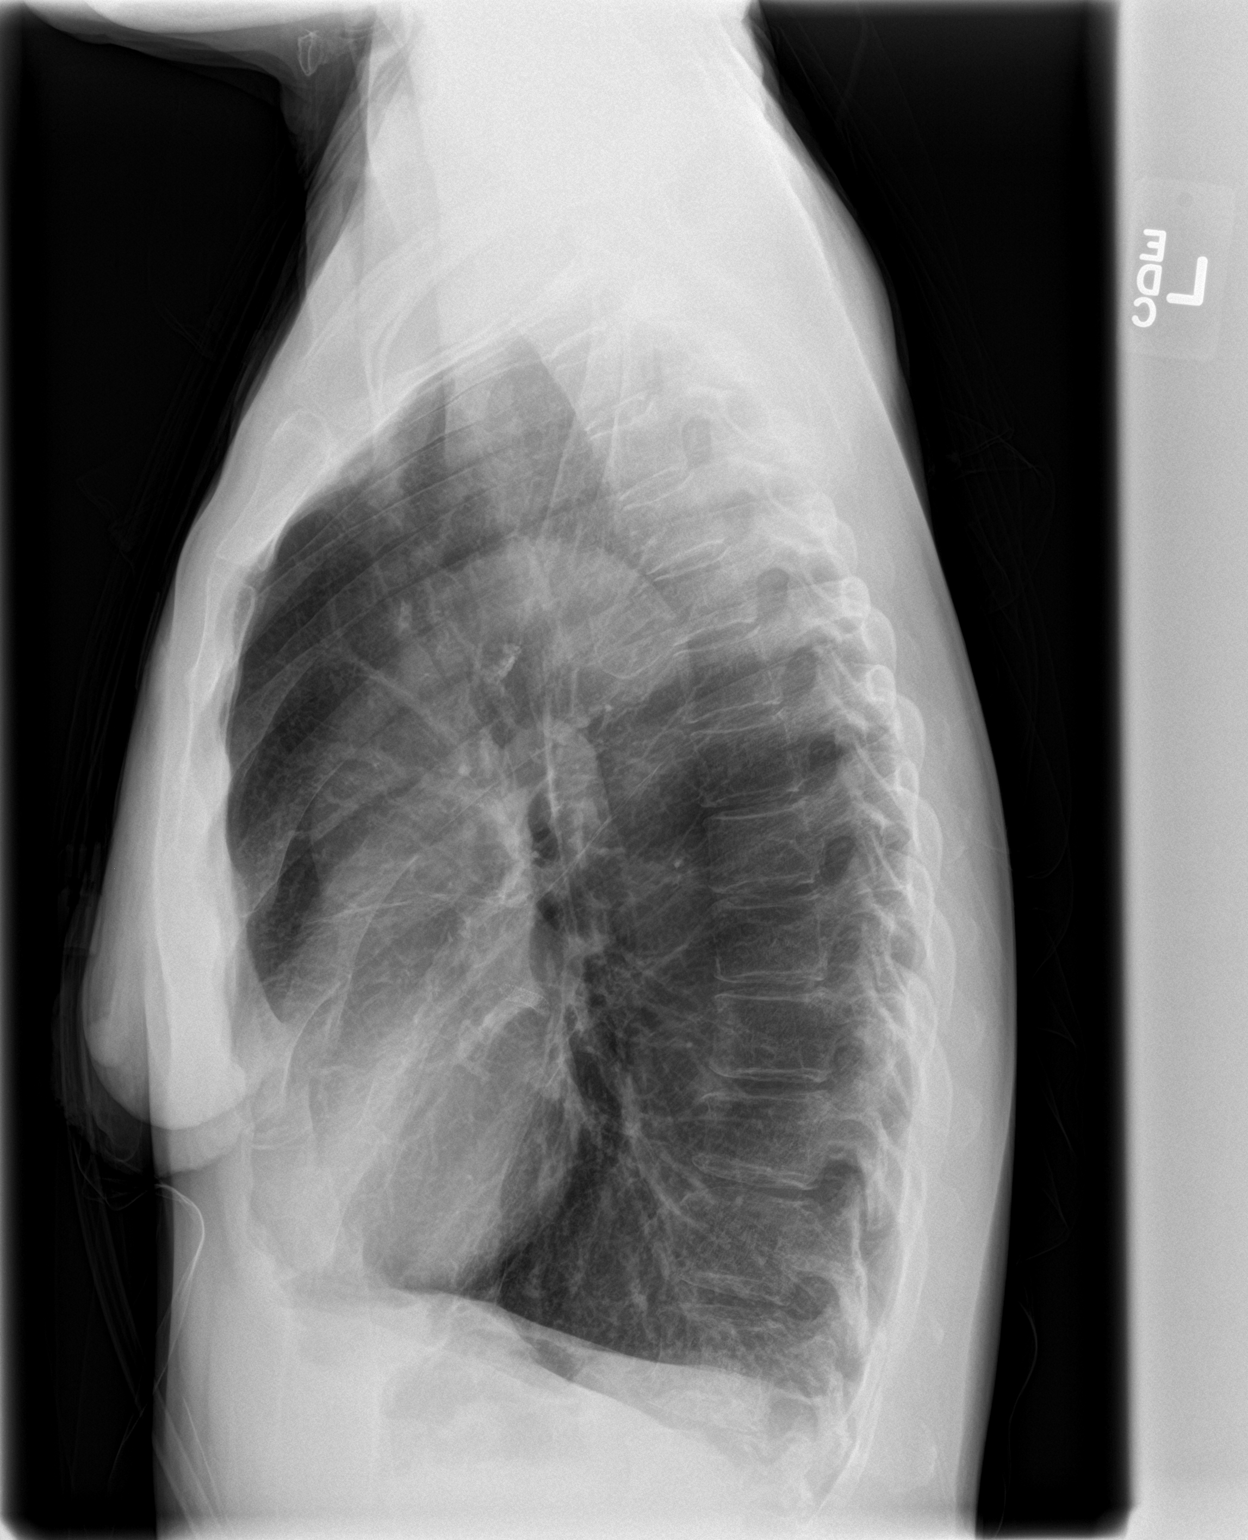

[2 of 2 positions shown; findings below may reference images not displayed]

FINDINGS: Heart size is normal. There is thoracic aortic atherosclerosis
without aneurysm enveloping the arch. The lungs are markedly
hyperinflated with attenuated pulmonary and interstitial lung
markings consistent with COPD. No effusion or pneumonic
consolidation. No overt pulmonary edema. No acute nor suspicious
osseous abnormality.
IMPRESSION: 1. Emphysematous hyperinflation of the lungs without acute pneumonic
consolidation or CHF.
2. Aortic atherosclerosis.

## 2018-06-07 IMAGING — DX DG CHEST 1V PORT
1 series · 1 of 1 positions shown · non-contrast
Comparison: 01/26/2017

CLINICAL DATA: Shortness of breath for 2 days. History of COPD.
Cough. History of congestive heart failure, hypertension, smoker.

EXAM:
PORTABLE CHEST 1 VIEW

[chest ap]
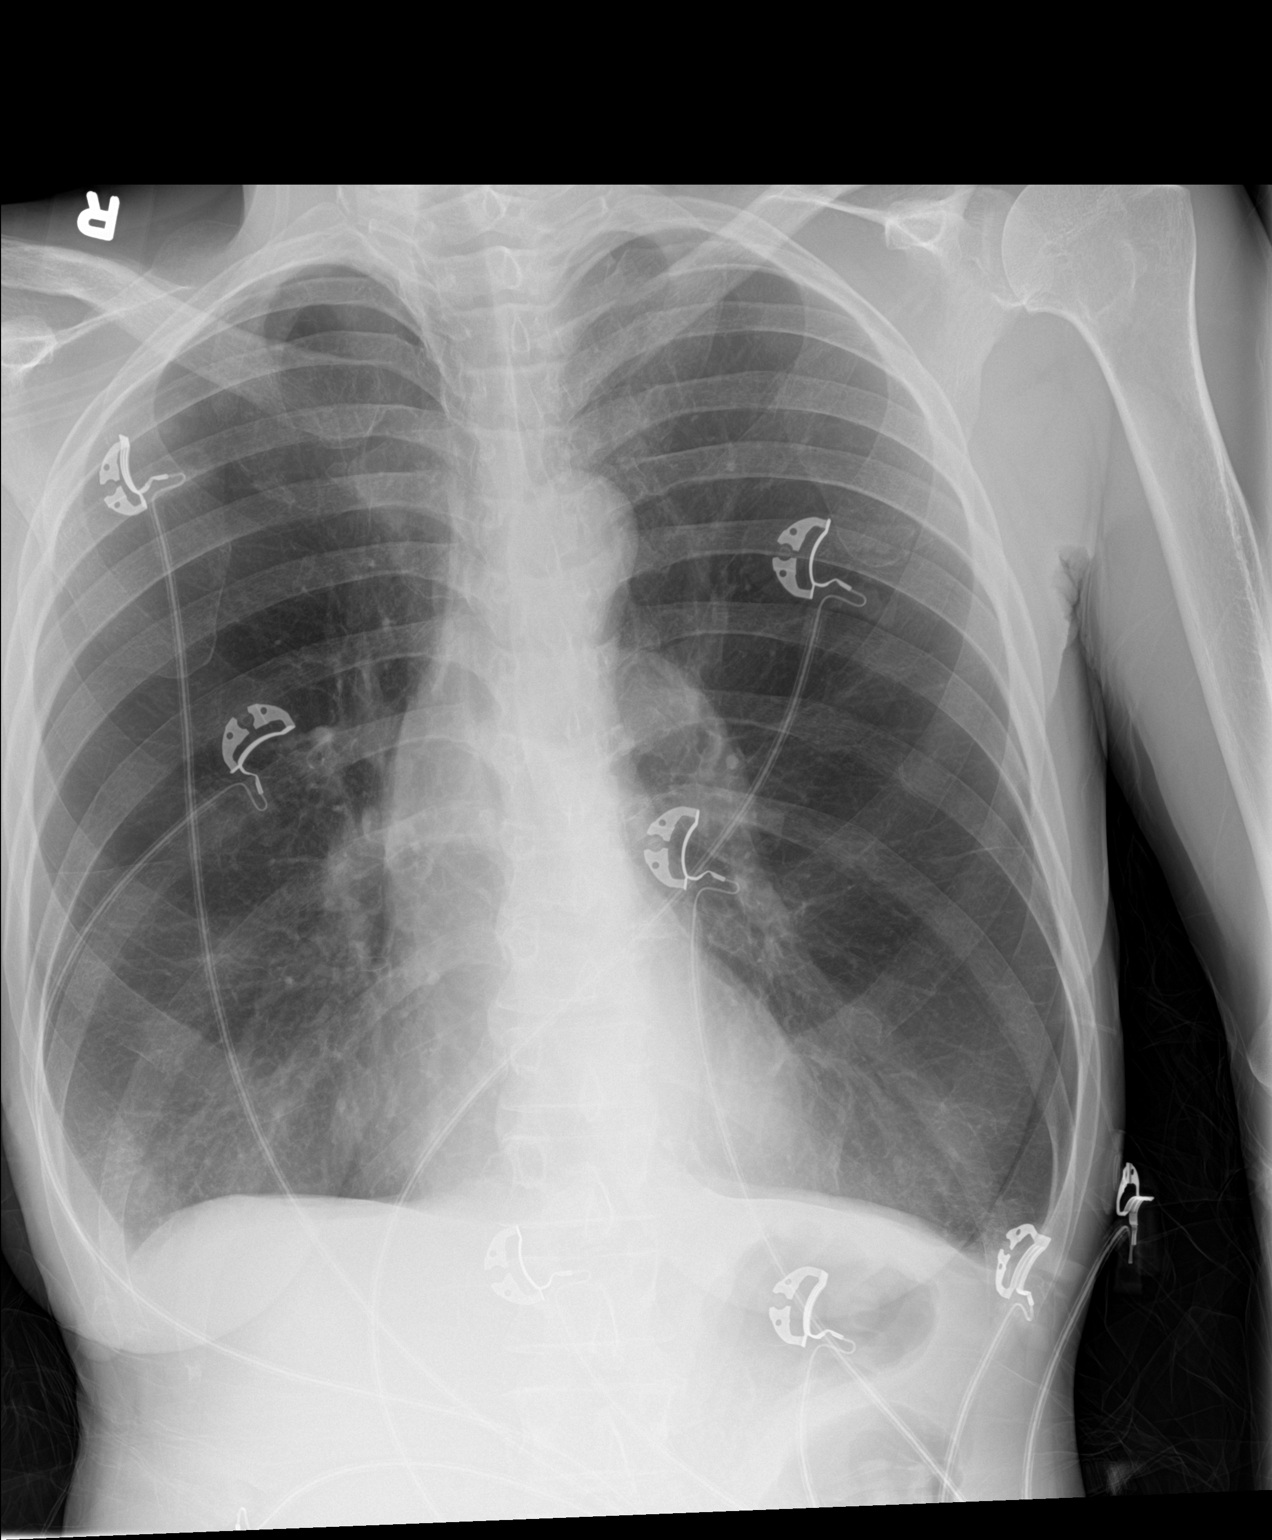

[1 of 1 positions shown; findings below may reference images not displayed]

FINDINGS: Heart size and pulmonary vascularity are normal. Prominent
hyperinflation of the lungs likely representing emphysema. No
airspace disease or consolidation. No blunting of costophrenic
angles. No pneumothorax. Mediastinal contours appear intact.
IMPRESSION: Emphysematous changes in the lungs. No evidence of active pulmonary
disease.

## 2018-07-28 ENCOUNTER — Encounter: Payer: Self-pay | Admitting: Gastroenterology

## 2018-07-28 ENCOUNTER — Ambulatory Visit: Payer: Medicaid Other | Admitting: Gastroenterology

## 2018-07-28 VITALS — BP 110/69 | HR 76 | Ht <= 58 in | Wt 133.6 lb

## 2018-07-28 DIAGNOSIS — Z931 Gastrostomy status: Secondary | ICD-10-CM | POA: Diagnosis not present

## 2018-07-28 NOTE — Progress Notes (Signed)
Michelle Bellows MD, MRCP(U.K) [address]  [place]  [place], [place] [address]  Main: [phone]  Fax: [phone]   Gastroenterology Consultation  Referring Provider:     [facility] [place] Physician:  [facility] Primary Gastroenterologist:  Dr. Jonathon [name]  Reason for Consultation:     Issues with his G-tube.        HPI:   SOMAYA GRASSI is a 58 y.o. y/o female referred for consultation & management  by Dr. Domingo Madeira, [facility].  She has been referred for evaluation of her G tube . Main concern by patient and family was a discharge around the G tube site.    Past Medical History:  Diagnosis Date  . Allergy   . Anxiety   . Asthma   . CHF (congestive heart failure) ([place])   . Cocaine abuse ([place])   . COPD (chronic obstructive pulmonary disease) ([place])   . Coronary artery disease   . Emphysema/COPD ([place])   . Hepatitis C   . High cholesterol   . Hypertension   . Pneumothorax   . Tobacco abuse     Past Surgical History:  Procedure Laterality Date  . CARDIAC CATHETERIZATION    . CHEST TUBE INSERTION    . IR GASTROSTOMY TUBE MOD SED  10/31/2017  . TRACHEOSTOMY TUBE PLACEMENT N/A 10/17/2017   Procedure: TRACHEOSTOMY;  Surgeon: Carloyn Manner, MD;  Location: [facility] ORS;  Service: ENT;  Laterality: N/A;    Prior to Admission medications   Medication Sig Start Date End Date Taking? Authorizing Provider  AMBULATORY NON FORMULARY MEDICATION Medication Name: Pulse oximeter DME:[facility] DX J44.9....J96.21 01/08/18   Flora Lipps, MD  budesonide (PULMICORT) 0.25 MG/2ML nebulizer solution Take 2 mLs (0.25 mg total) by nebulization 2 (two) times daily. 12/31/17   Conforti, John, DO  clonazePAM (KLONOPIN) 0.5 MG tablet Place 1 tablet (0.5 mg total) into feeding tube daily. 01/07/18   Flora Lipps, MD  clonazePAM (KLONOPIN) 1 MG tablet Place 1 tablet (1 mg total) into feeding tube at bedtime. 12/31/17   Conforti, John, DO  doxycycline  (VIBRA-TABS) 100 MG tablet Place 1 tablet (100 mg total) into feeding tube every 12 (twelve) hours. 12/31/17   Conforti, John, DO  famotidine (PEPCID) 20 MG tablet Place 1 tablet (20 mg total) into feeding tube daily. 12/31/17   Conforti, John, DO  fentaNYL (DURAGESIC - DOSED MCG/HR) 25 MCG/HR patch Place 1 patch (25 mcg total) onto the skin every 3 (three) days. 01/07/18   Flora Lipps, MD  ipratropium-albuterol (DUONEB) 0.5-2.5 (3) MG/3ML SOLN Take 3 mLs by nebulization every 6 (six) hours. 12/31/17   Conforti, John, DO  oxyCODONE-acetaminophen (PERCOCET/ROXICET) 5-325 MG tablet Place 1 tablet into feeding tube every 8 (eight) hours as needed for severe pain. 01/07/18   Flora Lipps, MD  PARoxetine (PAXIL) 20 MG tablet Place 1 tablet (20 mg total) into feeding tube at bedtime. 12/31/17   Conforti, John, DO  QUEtiapine (SEROQUEL) 25 MG tablet Place 1 tablet (25 mg total) into feeding tube at bedtime. 12/31/17   Conforti, John, DO    Family History  Problem Relation Age of Onset  . Lung cancer Mother   . Asthma Mother   . CVA Father   . CAD Father   . Stroke Father   . Breast cancer Maternal Aunt 67  . COPD Sister      Social History   Tobacco Use  . Smoking status: Current Every Day Smoker  Packs/day: 0.10    Years: 41.00    Pack years: 4.10    Types: Cigarettes  . Smokeless tobacco: Never Used  Substance Use Topics  . Alcohol use: No    Frequency: Never  . Drug use: Yes    Types: "Crack" cocaine    Comment: Past cocaine use.  Quit 04/2017    Allergies as of 07/28/2018 - Review Complete 10/31/2017  Allergen Reaction Noted  . Carvedilol Other (See Comments) 10/01/2017    Review of Systems:    All systems reviewed and negative except where noted in HPI.   Physical Exam:  LMP 03/06/2005 (Approximate)  Patient's last menstrual period was 03/06/2005 (approximate). Psych:  Alert and cooperative. Normal mood and affect.ina wheel chair  General:   Alert,  Well-developed,  well-nourished, pleasant and cooperative in NAD Head:  Normocephalic and atraumatic. Eyes:  Sclera clear, no icterus.   Conjunctiva pink. Ears:  Normal auditory acuity. Nose:  No deformity, discharge, or lesions. Mouth:  No deformity or lesions,oropharynx pink & moist. Neck:  Supple; no masses or thyromegaly. Lungs:  Respirations even and unlabored.  Clear throughout to auscultation.   No wheezes, crackles, or rhonchi. No acute distress. Heart:  Regular rate and rhythm; no murmurs, clicks, rubs, or gallops. Abdomen:  Normal bowel sounds.  No bruits.  Soft, non-tender and non-distended without masses, hepatosplenomegaly or hernias noted.  No guarding or rebound tenderness.  G tube site exposed after removing dressing, minimal white discharge around the site, no active bleeding , no eveidence of inflammtion or erythema, the bumper was very close to the abdominal wall which was loosened and confirmed that it could be well rotated. The connector has a bit of discoloration but is intact   Neurologic:  Alert and oriented x3;  grossly normal neurologically. Skin:  Intact without significant lesions or rashes. No jaundice. Lymph Nodes:  No significant cervical adenopathy. Psych:  Alert and cooperative. Normal mood and affect.  Imaging Studies: No results found.  Assessment and Plan:   ORLEAN HOLTROP is a 58 y.o. y/o female has been referred for issues with G tube.  G tube site exposed after removing dressing, minimal white discharge around the site, no active bleeding , no evidence of inflammtion or erythema, the bumper was very close to the abdominal wall which was loosened and confirmed that it could be well rotated. The connector has a bit of discoloration but is intact  .  Plan  1, Educated family on G tube trouble shooting, dressing change, flushing .   Follow up PRN  Dr Michelle Bellows MD,MRCP(U.K)

## 2018-08-06 ENCOUNTER — Telehealth: Payer: Self-pay | Admitting: Primary Care

## 2018-08-06 NOTE — Telephone Encounter (Signed)
Call to arrange community palliative care  NP visit. Message left.

## 2018-08-08 ENCOUNTER — Other Ambulatory Visit: Payer: Medicaid Other | Admitting: Primary Care

## 2018-08-11 ENCOUNTER — Other Ambulatory Visit: Payer: Medicaid Other | Admitting: Primary Care

## 2018-08-11 DIAGNOSIS — J441 Chronic obstructive pulmonary disease with (acute) exacerbation: Secondary | ICD-10-CM

## 2018-08-11 DIAGNOSIS — Z515 Encounter for palliative care: Secondary | ICD-10-CM

## 2018-08-11 NOTE — Progress Notes (Deleted)
Patient is a 58 year old female with prolonged complicated hospital course, from 09/30/2017 to 12/31/2017.  Medical history includes tobacco Abuse, Pneumothorax, HTN, Hypercholesteremia, Hepatitis C, Emphysema/COPD, Cocaine Abuse, CHF, Asthma, Anxiety, and Allergies. She presented to [facility] ER via EMS on 04/29 with respiratory distress.  She had a prolonged hospitalization with severe COPD, multiple episodes of pneumonia, difficulty in weaning mechanical ventilation secondary to significant anxiety component, eventually she required tracheostomy.  She had failed multiple trials of trach collar she was discharged to [facility] for continued ventilator dependence.  Before that admission the patient was followed at [facility] for severe stage IV COPD, group D.

## 2018-08-11 NOTE — Progress Notes (Addendum)
[place] Consult Note Telephone: [phone]  Fax: [phone]  PATIENT NAME: Michelle [name] DOB: Oct 05, 1960 MRN: [id number]  PRIMARY CARE PROVIDER:   Center, [facility]  [place], Hattie Perch, MD   REFERRING PROVIDER:  Center, [facility] [place], [place] [address]  RESPONSIBLE PARTY:   Extended Emergency Contact Information Primary Emergency Contact: Matousek,Yasha Address: [address] [place], [place] [address] Johnnette Litter of [place] Phone: [phone] Mobile Phone: [phone] Relation: Daughter Secondary Emergency Contact: [place], [place] Phone: [phone] Mobile Phone: [phone] Relation: Daughter   Community Palliative medicine was asked to consult on this case by PCP Dr. Sherril Cong. This is the initial visit .  ASSESSMENT and RECOMMENDATIONS:   1.Stamina:Recommend increasing activity by home health PT referral. Bed bound for some time,. She does go out by chair to MD appts. Discussed increasing activity especially b/c she mentioned wanting to wean from Trilogy. Also has recent URI which she states makes her more SOB than baseline.  2. Pain Recommend gabapentin 100 mg tid or 300 mg at HS. LE and neck.  Would also recommend to explore morphine IR 5 mg q 6 hrs for break through pain, as it's more affordable and lower potency than oxycodone. Discussed with patient and she is not opposed. Now taking Fentanyl 25 mcg transdermally with tid prn po percocet 5/325. Changing to non acetaminophen prn dosing would also help with establishing a need to increase the fentanyl dose to comfort. Patient states she is mostly at at 6/10 pain level, which is moderate pain.  She has run out of her prn pain medications and has been out for 2 weeks. There is a lock box for narcotics in the home.  3. Respiratory status: Has home Trilogy ventilator, trach.Reports URI today but no fever, dyspnea  or increased cough. States desire to wean off, and attempted a year ago. Encouraged to begin increasing activity to improve tolerance to activity, e.g modified HEP.  4. Nutrition: H/o protein calorie malnutrition, do not have access to recent labs. States she's eating po now and not taking the supplements. Need to assess current state with labs on next visit to pcp.  5. Goals of Care: Patient states hopes for future, desire to get better and wean off ventilator. Will discuss advance directives on next visit after upcoming consultation with pulmonary medicine.    Palliative care will continue to follow for goals of care clarification, symptom management. Return 3-4 weeks or PRN.  I spent 60 minutes providing this consultation,  from 12:00 to 13:00. More than 50% of the time in this consultation was spent coordinating communication.   HISTORY OF PRESENT ILLNESS:  Michelle [name] is a 58 y.o. year old female with multiple medical problems including COPD, chronic respiratory failure, h/o tobacco abuse. Palliative Care was asked to help address goals of care.   CODE STATUS: FULL CODE  PPS: 30% HOSPICE ELIGIBILITY/DIAGNOSIS: TBD  PAST MEDICAL HISTORY:  Past Medical History:  Diagnosis Date  . Allergy   . Anxiety   . Asthma   . CHF (congestive heart failure) ([place])   . Cocaine abuse ([place])   . COPD (chronic obstructive pulmonary disease) ([place])   . Coronary artery disease   . Emphysema/COPD ([place])   . Hepatitis C   . High cholesterol   . Hypertension   . Pneumothorax   . Tobacco abuse  SOCIAL HX:  Social History   Tobacco Use  . Smoking status: Current Every Day Smoker    Packs/day: 0.10    Years: 41.00    Pack years: 4.10    Types: Cigarettes  . Smokeless tobacco: Never Used  Substance Use Topics  . Alcohol use: No    Frequency: Never    ALLERGIES:  Allergies  Allergen Reactions  . Carvedilol Other (See Comments)    Pt gets "sick" when on medication     PERTINENT  MEDICATIONS:  Outpatient Encounter Medications as of 08/11/2018  Medication Sig  . AMBULATORY NON FORMULARY MEDICATION Medication Name: Pulse oximeter DME:AHC DX J44.9....J96.21  . budesonide (PULMICORT) 0.25 MG/2ML nebulizer solution Take 2 mLs (0.25 mg total) by nebulization 2 (two) times daily.  . clonazePAM (KLONOPIN) 0.5 MG tablet Place 1 tablet (0.5 mg total) into feeding tube daily. (Patient not taking: Reported on 07/28/2018)  . clonazePAM (KLONOPIN) 1 MG tablet Place 1 tablet (1 mg total) into feeding tube at bedtime.  Marland Kitchen doxycycline (VIBRA-TABS) 100 MG tablet Place 1 tablet (100 mg total) into feeding tube every 12 (twelve) hours. (Patient not taking: Reported on 07/28/2018)  . famotidine (PEPCID) 20 MG tablet Place 1 tablet (20 mg total) into feeding tube daily.  . fentaNYL (DURAGESIC - DOSED MCG/HR) 25 MCG/HR patch Place 1 patch (25 mcg total) onto the skin every 3 (three) days.  Marland Kitchen ipratropium-albuterol (DUONEB) 0.5-2.5 (3) MG/3ML SOLN Take 3 mLs by nebulization every 6 (six) hours.  Marland Kitchen oxyCODONE-acetaminophen (PERCOCET/ROXICET) 5-325 MG tablet Place 1 tablet into feeding tube every 8 (eight) hours as needed for severe pain.  Marland Kitchen PARoxetine (PAXIL) 20 MG tablet Place 1 tablet (20 mg total) into feeding tube at bedtime.  Marland Kitchen QUEtiapine (SEROQUEL) 25 MG tablet Place 1 tablet (25 mg total) into feeding tube at bedtime.   No facility-administered encounter medications on file as of 08/11/2018.     PHYSICAL EXAM:  Wt 133, 97.8-98-22 122/70 , weight unavailable. General: NAD, frail appearing, WNWD, able to speak for self. Whispers due to trach Cardiovascular: regular rate and rhythm, S1S2 Pulmonary: clear ant fields, rhonchi, Trilogy 1 machine,Oxygen @ 2 liters, to trach. Suctioned by care giver, Hinton Dyer , LPN Abdomen: soft, nontender, + bowel sounds, feeding tube but eats po per self report. GU: no suprapubic tenderness, uses bed pan Extremities: no edema, no joint deformities, states LE  pain Skin: no rashes, wounds per LPN Neurological: Weakness , A and O x 3,   Cyndia Skeeters DNP, AGPCNP-BC

## 2018-08-12 ENCOUNTER — Institutional Professional Consult (permissible substitution): Payer: Medicaid Other | Admitting: Internal Medicine

## 2018-08-18 ENCOUNTER — Institutional Professional Consult (permissible substitution): Payer: Medicaid Other | Admitting: Internal Medicine

## 2018-08-28 ENCOUNTER — Telehealth: Payer: Self-pay

## 2018-08-28 NOTE — Telephone Encounter (Signed)
LM regarding apt 09/03/18, keep or r/s?

## 2018-09-02 ENCOUNTER — Telehealth: Payer: Self-pay | Admitting: Primary Care

## 2018-09-02 NOTE — Telephone Encounter (Signed)
Called and spoke with daughter [name]. They decided to reschedule pulmonology until later after Covid has passed by. Daughter states she is at definite pulmonary risk. They also have not had any PT come out. I think it would be well to wait in that as well, and I will not pursue at this time. In general daughter states she's doing well and is stable. Information left for telemedicine appt should they so desire. Otherwise I will call to check in again in about 3 weeks.

## 2018-09-03 ENCOUNTER — Institutional Professional Consult (permissible substitution): Payer: Medicaid Other | Admitting: Internal Medicine

## 2018-09-17 ENCOUNTER — Other Ambulatory Visit: Payer: Self-pay

## 2018-09-17 ENCOUNTER — Inpatient Hospital Stay
Admission: EM | Admit: 2018-09-17 | Discharge: 2018-09-20 | DRG: 871 | Disposition: A | Discharge status: Home Health | Payer: Medicaid Other | Attending: Internal Medicine | Admitting: Internal Medicine

## 2018-09-17 ENCOUNTER — Emergency Department: Payer: Medicaid Other

## 2018-09-17 ENCOUNTER — Encounter: Payer: Self-pay | Admitting: Emergency Medicine

## 2018-09-17 DIAGNOSIS — E785 Hyperlipidemia, unspecified: Secondary | ICD-10-CM | POA: Diagnosis present

## 2018-09-17 DIAGNOSIS — Z930 Tracheostomy status: Secondary | ICD-10-CM | POA: Diagnosis not present

## 2018-09-17 DIAGNOSIS — F410 Panic disorder [episodic paroxysmal anxiety] without agoraphobia: Secondary | ICD-10-CM | POA: Diagnosis present

## 2018-09-17 DIAGNOSIS — Z888 Allergy status to other drugs, medicaments and biological substances status: Secondary | ICD-10-CM

## 2018-09-17 DIAGNOSIS — J151 Pneumonia due to Pseudomonas: Secondary | ICD-10-CM | POA: Diagnosis present

## 2018-09-17 DIAGNOSIS — Z79899 Other long term (current) drug therapy: Secondary | ICD-10-CM

## 2018-09-17 DIAGNOSIS — J181 Lobar pneumonia, unspecified organism: Secondary | ICD-10-CM | POA: Diagnosis not present

## 2018-09-17 DIAGNOSIS — F329 Major depressive disorder, single episode, unspecified: Secondary | ICD-10-CM | POA: Diagnosis present

## 2018-09-17 DIAGNOSIS — I5032 Chronic diastolic (congestive) heart failure: Secondary | ICD-10-CM | POA: Diagnosis present

## 2018-09-17 DIAGNOSIS — Z825 Family history of asthma and other chronic lower respiratory diseases: Secondary | ICD-10-CM | POA: Diagnosis not present

## 2018-09-17 DIAGNOSIS — J45901 Unspecified asthma with (acute) exacerbation: Secondary | ICD-10-CM | POA: Diagnosis present

## 2018-09-17 DIAGNOSIS — A419 Sepsis, unspecified organism: Principal | ICD-10-CM | POA: Diagnosis present

## 2018-09-17 DIAGNOSIS — I110 Hypertensive heart disease with heart failure: Secondary | ICD-10-CM | POA: Diagnosis present

## 2018-09-17 DIAGNOSIS — Z8249 Family history of ischemic heart disease and other diseases of the circulatory system: Secondary | ICD-10-CM | POA: Diagnosis not present

## 2018-09-17 DIAGNOSIS — F17210 Nicotine dependence, cigarettes, uncomplicated: Secondary | ICD-10-CM | POA: Diagnosis present

## 2018-09-17 DIAGNOSIS — Z20828 Contact with and (suspected) exposure to other viral communicable diseases: Secondary | ICD-10-CM | POA: Diagnosis present

## 2018-09-17 DIAGNOSIS — Z801 Family history of malignant neoplasm of trachea, bronchus and lung: Secondary | ICD-10-CM

## 2018-09-17 DIAGNOSIS — I2510 Atherosclerotic heart disease of native coronary artery without angina pectoris: Secondary | ICD-10-CM | POA: Diagnosis present

## 2018-09-17 DIAGNOSIS — J9622 Acute and chronic respiratory failure with hypercapnia: Secondary | ICD-10-CM | POA: Diagnosis present

## 2018-09-17 DIAGNOSIS — J9621 Acute and chronic respiratory failure with hypoxia: Secondary | ICD-10-CM | POA: Diagnosis present

## 2018-09-17 DIAGNOSIS — Z7951 Long term (current) use of inhaled steroids: Secondary | ICD-10-CM

## 2018-09-17 DIAGNOSIS — J9690 Respiratory failure, unspecified, unspecified whether with hypoxia or hypercapnia: Secondary | ICD-10-CM

## 2018-09-17 DIAGNOSIS — Z9911 Dependence on respirator [ventilator] status: Secondary | ICD-10-CM | POA: Diagnosis not present

## 2018-09-17 DIAGNOSIS — R0902 Hypoxemia: Secondary | ICD-10-CM | POA: Diagnosis present

## 2018-09-17 DIAGNOSIS — J449 Chronic obstructive pulmonary disease, unspecified: Secondary | ICD-10-CM | POA: Diagnosis not present

## 2018-09-17 DIAGNOSIS — J439 Emphysema, unspecified: Secondary | ICD-10-CM | POA: Diagnosis present

## 2018-09-17 DIAGNOSIS — Z803 Family history of malignant neoplasm of breast: Secondary | ICD-10-CM

## 2018-09-17 DIAGNOSIS — J189 Pneumonia, unspecified organism: Secondary | ICD-10-CM | POA: Diagnosis present

## 2018-09-17 DIAGNOSIS — R509 Fever, unspecified: Secondary | ICD-10-CM

## 2018-09-17 LAB — COMPREHENSIVE METABOLIC PANEL
ALT: 16 U/L (ref 0–44)
AST: 21 U/L (ref 15–41)
Albumin: 4.1 g/dL (ref 3.5–5.0)
Alkaline Phosphatase: 116 U/L (ref 38–126)
Anion gap: 12 (ref 5–15)
BUN: 16 mg/dL (ref 6–20)
CO2: 25 mmol/L (ref 22–32)
Calcium: 9.2 mg/dL (ref 8.9–10.3)
Chloride: 105 mmol/L (ref 98–111)
Creatinine, Ser: 0.64 mg/dL (ref 0.44–1.00)
GFR calc Af Amer: >60 mL/min (ref >60)
GFR calc non Af Amer: >60 mL/min (ref >60)
Glucose, Bld: 101 mg/dL — ABNORMAL HIGH (ref 70–99)
Potassium: 3.7 mmol/L (ref 3.5–5.1)
Sodium: 142 mmol/L (ref 135–145)
Total Bilirubin: 0.5 mg/dL (ref 0.3–1.2)
Total Protein: 8.3 g/dL — ABNORMAL HIGH (ref 6.5–8.1)

## 2018-09-17 LAB — URINALYSIS, ROUTINE W REFLEX MICROSCOPIC
Bilirubin Urine: NEGATIVE
Glucose, UA: NEGATIVE mg/dL
Ketones, ur: NEGATIVE mg/dL
Leukocytes,Ua: NEGATIVE
Nitrite: NEGATIVE
Protein, ur: NEGATIVE mg/dL
Specific Gravity, Urine: 1.012 (ref 1.005–1.030)
Squamous Epithelial / HPF: NONE SEEN (ref 0–5)
pH: 7 (ref 5.0–8.0)

## 2018-09-17 LAB — BLOOD GAS, ARTERIAL
Acid-Base Excess: 2.5 mmol/L — ABNORMAL HIGH (ref 0.0–2.0)
Acid-base deficit: 6.4 mmol/L — ABNORMAL HIGH (ref 0.0–2.0)
Bicarbonate: 26.3 mmol/L (ref 20.0–28.0)
Bicarbonate: 22 mmol/L (ref 20.0–28.0)
FIO2: 0.4
FIO2: 50
MECHVT: 450 mL
MECHVT: 450 mL
Mechanical Rate: 16
Mechanical Rate: 16
O2 Saturation: 94.1 %
O2 Saturation: 97.9 %
PEEP: 5 cmH2O
PEEP: 15 cmH2O
Patient temperature: 37
Patient temperature: 37
pCO2 arterial: 37 mmHg (ref 32.0–48.0)
pCO2 arterial: 55 mmHg — ABNORMAL HIGH (ref 32.0–48.0)
pH, Arterial: 7.46 — ABNORMAL HIGH (ref 7.350–7.450)
pH, Arterial: 7.21 — ABNORMAL LOW (ref 7.350–7.450)
pO2, Arterial: 67 mmHg — ABNORMAL LOW (ref 83.0–108.0)
pO2, Arterial: 122 mmHg — ABNORMAL HIGH (ref 83.0–108.0)

## 2018-09-17 LAB — CBC WITH DIFFERENTIAL/PLATELET
Abs Immature Granulocytes: 0.04 10*3/uL (ref 0.00–0.07)
Basophils Absolute: 0 10*3/uL (ref 0.0–0.1)
Basophils Relative: 0 %
Eosinophils Absolute: 0.1 10*3/uL (ref 0.0–0.5)
Eosinophils Relative: 1 %
HCT: 41.6 % (ref 36.0–46.0)
Hemoglobin: 12.5 g/dL (ref 12.0–15.0)
Immature Granulocytes: 0 %
Lymphocytes Relative: 9 %
Lymphs Abs: 1.2 10*3/uL (ref 0.7–4.0)
MCH: 26.9 pg (ref 26.0–34.0)
MCHC: 30 g/dL (ref 30.0–36.0)
MCV: 89.5 fL (ref 80.0–100.0)
Monocytes Absolute: 0.9 10*3/uL (ref 0.1–1.0)
Monocytes Relative: 7 %
Neutro Abs: 10.3 10*3/uL — ABNORMAL HIGH (ref 1.7–7.7)
Neutrophils Relative %: 83 %
Platelets: 239 10*3/uL (ref 150–400)
RBC: 4.65 MIL/uL (ref 3.87–5.11)
RDW: 14 % (ref 11.5–15.5)
WBC: 12.4 10*3/uL — ABNORMAL HIGH (ref 4.0–10.5)
nRBC: 0 % (ref 0.0–0.2)

## 2018-09-17 LAB — MRSA PCR SCREENING: MRSA by PCR: NEGATIVE

## 2018-09-17 LAB — PROCALCITONIN: Procalcitonin: 0.19 ng/mL

## 2018-09-17 LAB — SARS CORONAVIRUS 2 BY RT PCR (HOSPITAL ORDER, PERFORMED IN ~~LOC~~ HOSPITAL LAB): SARS Coronavirus 2: NEGATIVE

## 2018-09-17 LAB — LACTIC ACID, PLASMA
Lactic Acid, Venous: 2.9 mmol/L (ref 0.5–1.9)
Lactic Acid, Venous: 1.2 mmol/L (ref 0.5–1.9)

## 2018-09-17 LAB — BRAIN NATRIURETIC PEPTIDE: B Natriuretic Peptide: 44 pg/mL (ref 0.0–100.0)

## 2018-09-17 LAB — GLUCOSE, CAPILLARY: Glucose-Capillary: 161 mg/dL — ABNORMAL HIGH (ref 70–99)

## 2018-09-17 MED ORDER — SODIUM CHLORIDE 0.9 % IV SOLN
500.0000 mg | INTRAVENOUS | Status: DC
  Administered 2018-09-17: 10:00:00 500 mg via INTRAVENOUS
  Filled 2018-09-17 (×2): qty 500

## 2018-09-17 MED ORDER — VANCOMYCIN HCL IN DEXTROSE 1-5 GM/200ML-% IV SOLN: 1000.0000 mg | INTRAVENOUS | Status: DC

## 2018-09-17 MED ORDER — OXYCODONE-ACETAMINOPHEN 5-325 MG PO TABS
1.0000 | ORAL_TABLET | Freq: Three times a day (TID) | ORAL | Status: DC | PRN
  Administered 2018-09-17 – 2018-09-20 (×8): 1
  Filled 2018-09-17 (×8): qty 1

## 2018-09-17 MED ORDER — ACETAMINOPHEN 325 MG PO TABS
650.0000 mg | ORAL_TABLET | Freq: Four times a day (QID) | ORAL | Status: DC | PRN
  Administered 2018-09-20: 650 mg
  Filled 2018-09-17: qty 2

## 2018-09-17 MED ORDER — ACETAMINOPHEN 650 MG RE SUPP
650.0000 mg | Freq: Four times a day (QID) | RECTAL | Status: DC | PRN
  Filled 2018-09-17: qty 1

## 2018-09-17 MED ORDER — ENALAPRIL MALEATE 5 MG PO TABS
5.0000 mg | ORAL_TABLET | Freq: Every day | ORAL | Status: DC
  Administered 2018-09-18 – 2018-09-20 (×3): 5 mg
  Filled 2018-09-17 (×3): qty 1

## 2018-09-17 MED ORDER — MAGNESIUM HYDROXIDE 400 MG/5ML PO SUSP: 30.0000 mL | Freq: Every day | ORAL | Status: DC | PRN

## 2018-09-17 MED ORDER — FENTANYL 25 MCG/HR TD PT72
1.0000 | MEDICATED_PATCH | TRANSDERMAL | Status: DC
  Administered 2018-09-17 – 2018-09-20 (×2): 1 via TRANSDERMAL
  Filled 2018-09-17 (×2): qty 1

## 2018-09-17 MED ORDER — BUDESONIDE 0.25 MG/2ML IN SUSP
0.2500 mg | Freq: Two times a day (BID) | RESPIRATORY_TRACT | Status: DC
  Administered 2018-09-17 – 2018-09-20 (×7): 0.25 mg via RESPIRATORY_TRACT
  Filled 2018-09-17 (×8): qty 2

## 2018-09-17 MED ORDER — SENNA 8.6 MG PO TABS: 1.0000 | ORAL_TABLET | Freq: Every day | ORAL | Status: DC | PRN

## 2018-09-17 MED ORDER — VANCOMYCIN HCL IN DEXTROSE 750-5 MG/150ML-% IV SOLN
750.0000 mg | INTRAVENOUS | Status: DC
  Administered 2018-09-18: 750 mg via INTRAVENOUS
  Filled 2018-09-17: qty 150

## 2018-09-17 MED ORDER — ORAL CARE MOUTH RINSE
15.0000 mL | OROMUCOSAL | Status: DC
  Administered 2018-09-17 – 2018-09-20 (×19): 15 mL via OROMUCOSAL

## 2018-09-17 MED ORDER — METHYLPREDNISOLONE SODIUM SUCC 125 MG IJ SOLR
60.0000 mg | Freq: Three times a day (TID) | INTRAMUSCULAR | Status: DC
  Filled 2018-09-17: qty 0.96

## 2018-09-17 MED ORDER — CHLORHEXIDINE GLUCONATE 0.12% ORAL RINSE (MEDLINE KIT)
15.0000 mL | Freq: Two times a day (BID) | OROMUCOSAL | Status: DC
  Administered 2018-09-17 – 2018-09-20 (×4): 15 mL via OROMUCOSAL

## 2018-09-17 MED ORDER — SIMVASTATIN 40 MG PO TABS
40.0000 mg | ORAL_TABLET | Freq: Every day | ORAL | Status: DC
  Administered 2018-09-17 – 2018-09-19 (×3): 40 mg
  Filled 2018-09-17 (×2): qty 1
  Filled 2018-09-17 (×2): qty 2
  Filled 2018-09-17 (×2): qty 1
  Filled 2018-09-17: qty 2

## 2018-09-17 MED ORDER — INFLUENZA VAC SPLIT QUAD 0.5 ML IM SUSY: 0.5000 mL | PREFILLED_SYRINGE | INTRAMUSCULAR | Status: DC | PRN

## 2018-09-17 MED ORDER — SODIUM CHLORIDE 0.9 % IV SOLN
2.0000 g | Freq: Once | INTRAVENOUS | Status: AC
  Administered 2018-09-17: 02:00:00 2 g via INTRAVENOUS
  Filled 2018-09-17: qty 2

## 2018-09-17 MED ORDER — MIRTAZAPINE 15 MG PO TABS
45.0000 mg | ORAL_TABLET | Freq: Every day | ORAL | Status: DC
  Administered 2018-09-17 – 2018-09-19 (×3): 45 mg
  Filled 2018-09-17 (×3): qty 3

## 2018-09-17 MED ORDER — VANCOMYCIN HCL IN DEXTROSE 1-5 GM/200ML-% IV SOLN
1000.0000 mg | Freq: Once | INTRAVENOUS | Status: DC
  Filled 2018-09-17: qty 200

## 2018-09-17 MED ORDER — ENALAPRIL MALEATE 5 MG PO TABS
5.0000 mg | ORAL_TABLET | Freq: Every day | ORAL | Status: DC
  Administered 2018-09-17: 5 mg via ORAL
  Filled 2018-09-17: qty 1

## 2018-09-17 MED ORDER — SIMVASTATIN 40 MG PO TABS
40.0000 mg | ORAL_TABLET | Freq: Every day | ORAL | Status: DC
  Filled 2018-09-17: qty 1

## 2018-09-17 MED ORDER — ACETAMINOPHEN 325 MG PO TABS: 650.0000 mg | ORAL_TABLET | Freq: Four times a day (QID) | ORAL | Status: DC | PRN

## 2018-09-17 MED ORDER — MIRTAZAPINE 15 MG PO TABS: 45.0000 mg | ORAL_TABLET | Freq: Every day | ORAL | Status: DC

## 2018-09-17 MED ORDER — VANCOMYCIN HCL 10 G IV SOLR
1250.0000 mg | Freq: Once | INTRAVENOUS | Status: AC
  Administered 2018-09-17: 1250 mg via INTRAVENOUS
  Filled 2018-09-17: qty 1250

## 2018-09-17 MED ORDER — TRAZODONE HCL 50 MG PO TABS
25.0000 mg | ORAL_TABLET | Freq: Every evening | ORAL | Status: DC | PRN
  Filled 2018-09-17: qty 0.5

## 2018-09-17 MED ORDER — PAROXETINE HCL 20 MG PO TABS
20.0000 mg | ORAL_TABLET | Freq: Every day | ORAL | Status: DC
  Administered 2018-09-17 – 2018-09-19 (×3): 20 mg
  Filled 2018-09-17 (×4): qty 1

## 2018-09-17 MED ORDER — ONDANSETRON HCL 4 MG PO TABS
4.0000 mg | ORAL_TABLET | Freq: Four times a day (QID) | ORAL | Status: DC | PRN
  Filled 2018-09-17: qty 1

## 2018-09-17 MED ORDER — CLONAZEPAM 0.5 MG PO TABS
0.5000 mg | ORAL_TABLET | Freq: Every day | ORAL | Status: DC
  Administered 2018-09-17 – 2018-09-20 (×4): 0.5 mg
  Filled 2018-09-17 (×4): qty 1

## 2018-09-17 MED ORDER — ACETAMINOPHEN 650 MG RE SUPP: 650.0000 mg | Freq: Four times a day (QID) | RECTAL | Status: DC | PRN

## 2018-09-17 MED ORDER — SODIUM CHLORIDE 0.9 % IV SOLN: INTRAVENOUS | Status: DC

## 2018-09-17 MED ORDER — ASPIRIN 81 MG PO CHEW
81.0000 mg | CHEWABLE_TABLET | Freq: Every day | ORAL | Status: DC
  Administered 2018-09-18 – 2018-09-20 (×3): 81 mg
  Filled 2018-09-17 (×3): qty 1

## 2018-09-17 MED ORDER — SODIUM CHLORIDE 0.9 % IV SOLN
2.0000 g | Freq: Two times a day (BID) | INTRAVENOUS | Status: DC
  Administered 2018-09-17: 10:00:00 2 g via INTRAVENOUS
  Filled 2018-09-17 (×2): qty 2

## 2018-09-17 MED ORDER — LEVALBUTEROL HCL 1.25 MG/0.5ML IN NEBU
1.2500 mg | INHALATION_SOLUTION | RESPIRATORY_TRACT | Status: DC | PRN
  Filled 2018-09-17: qty 0.5

## 2018-09-17 MED ORDER — ENOXAPARIN SODIUM 40 MG/0.4ML ~~LOC~~ SOLN
40.0000 mg | SUBCUTANEOUS | Status: DC
  Administered 2018-09-17 – 2018-09-19 (×3): 40 mg via SUBCUTANEOUS
  Filled 2018-09-17 (×3): qty 0.4

## 2018-09-17 MED ORDER — LORATADINE 10 MG PO TABS
10.0000 mg | ORAL_TABLET | Freq: Every day | ORAL | Status: DC
  Administered 2018-09-17: 10 mg via ORAL
  Filled 2018-09-17: qty 1

## 2018-09-17 MED ORDER — METHYLPREDNISOLONE SODIUM SUCC 125 MG IJ SOLR
125.0000 mg | Freq: Once | INTRAMUSCULAR | Status: AC
  Administered 2018-09-17: 07:00:00 125 mg via INTRAVENOUS
  Filled 2018-09-17: qty 2

## 2018-09-17 MED ORDER — ASPIRIN 81 MG PO CHEW
81.0000 mg | CHEWABLE_TABLET | Freq: Every day | ORAL | Status: DC
  Administered 2018-09-17: 09:00:00 81 mg via ORAL
  Filled 2018-09-17: qty 1

## 2018-09-17 MED ORDER — ONDANSETRON HCL 4 MG/2ML IJ SOLN: 4.0000 mg | Freq: Four times a day (QID) | INTRAMUSCULAR | Status: DC | PRN

## 2018-09-17 MED ORDER — IPRATROPIUM-ALBUTEROL 0.5-2.5 (3) MG/3ML IN SOLN
RESPIRATORY_TRACT | Status: AC
  Administered 2018-09-17: 05:00:00 9 mL via RESPIRATORY_TRACT
  Filled 2018-09-17: qty 9

## 2018-09-17 MED ORDER — ACETAMINOPHEN 650 MG RE SUPP
975.0000 mg | Freq: Once | RECTAL | Status: AC
  Administered 2018-09-17: 975 mg via RECTAL
  Filled 2018-09-17: qty 1

## 2018-09-17 MED ORDER — FLUTICASONE PROPIONATE 50 MCG/ACT NA SUSP
2.0000 | Freq: Every day | NASAL | Status: DC
  Administered 2018-09-17: 2 via NASAL
  Filled 2018-09-17: qty 16

## 2018-09-17 MED ORDER — SODIUM CHLORIDE 0.9 % IV BOLUS (SEPSIS)
1000.0000 mL | Freq: Once | INTRAVENOUS | Status: AC
  Administered 2018-09-17: 02:00:00 1000 mL via INTRAVENOUS

## 2018-09-17 MED ORDER — FAMOTIDINE 20 MG PO TABS
20.0000 mg | ORAL_TABLET | Freq: Every day | ORAL | Status: DC
  Administered 2018-09-17 – 2018-09-20 (×4): 20 mg
  Filled 2018-09-17 (×4): qty 1

## 2018-09-17 MED ORDER — CARVEDILOL 3.125 MG PO TABS
3.1250 mg | ORAL_TABLET | Freq: Two times a day (BID) | ORAL | Status: DC
  Administered 2018-09-17: 09:00:00 3.125 mg via ORAL
  Filled 2018-09-17: qty 1

## 2018-09-17 MED ORDER — QUETIAPINE FUMARATE 25 MG PO TABS
25.0000 mg | ORAL_TABLET | Freq: Every day | ORAL | Status: DC
  Administered 2018-09-17 – 2018-09-19 (×3): 25 mg
  Filled 2018-09-17 (×3): qty 1

## 2018-09-17 MED ORDER — IPRATROPIUM-ALBUTEROL 0.5-2.5 (3) MG/3ML IN SOLN
3.0000 mL | Freq: Four times a day (QID) | RESPIRATORY_TRACT | Status: DC
  Administered 2018-09-17 – 2018-09-20 (×14): 3 mL via RESPIRATORY_TRACT
  Filled 2018-09-17 (×14): qty 3

## 2018-09-17 MED ORDER — SODIUM CHLORIDE 0.9 % IV SOLN: 1.0000 g | Freq: Three times a day (TID) | INTRAVENOUS | Status: DC

## 2018-09-17 MED ORDER — IPRATROPIUM-ALBUTEROL 0.5-2.5 (3) MG/3ML IN SOLN
9.0000 mL | Freq: Once | RESPIRATORY_TRACT | Status: AC
  Administered 2018-09-17: 9 mL via RESPIRATORY_TRACT

## 2018-09-17 MED ORDER — METRONIDAZOLE IN NACL 5-0.79 MG/ML-% IV SOLN
500.0000 mg | Freq: Once | INTRAVENOUS | Status: AC
  Administered 2018-09-17: 02:00:00 500 mg via INTRAVENOUS
  Filled 2018-09-17: qty 100

## 2018-09-17 MED ORDER — SODIUM CHLORIDE 0.9 % IV SOLN
2.0000 g | Freq: Three times a day (TID) | INTRAVENOUS | Status: DC
  Administered 2018-09-17 – 2018-09-20 (×9): 2 g via INTRAVENOUS
  Filled 2018-09-17 (×12): qty 2

## 2018-09-17 MED ORDER — CARVEDILOL 3.125 MG PO TABS
3.1250 mg | ORAL_TABLET | Freq: Two times a day (BID) | ORAL | Status: DC
  Administered 2018-09-17 – 2018-09-20 (×6): 3.125 mg
  Filled 2018-09-17 (×6): qty 1

## 2018-09-17 MED ORDER — ACETAMINOPHEN 500 MG PO TABS
1000.0000 mg | ORAL_TABLET | Freq: Once | ORAL | Status: DC
  Filled 2018-09-17: qty 2

## 2018-09-17 MED ORDER — SODIUM CHLORIDE 0.9 % IV BOLUS (SEPSIS)
1000.0000 mL | Freq: Once | INTRAVENOUS | Status: AC
  Administered 2018-09-17: 1000 mL via INTRAVENOUS

## 2018-09-17 NOTE — ED Notes (Signed)
Attempted to call [name], Daughter to update but no answer. [phone]

## 2018-09-17 NOTE — ED Provider Notes (Signed)
[facility] Emergency Department Provider Note  ____________________________________________   First MD Initiated Contact with Patient 09/17/18 0111     (approximate)  I have reviewed the triage vital signs and the nursing notes.   HISTORY  Chief Complaint Shortness of Breath and Fever    HPI Michelle [name] is a 58 y.o. female brought to the ED from home via EMS with a chief complaint of shortness of breath and fever.  Patient has a tracheostomy is on a home ventilator.  Daughter states she began having trouble breathing approximately 2 hours prior to arrival.  Daughter gave patient a saline neb without relief of symptoms.  Patient was 86% upon EMS arrival with axillary temperature 102.1 F.  Patient denies chest pain, abdominal pain, nausea, vomiting, diarrhea.  Denies recent travel, trauma or exposure to persons diagnosed with coronavirus.       Past Medical History:  Diagnosis Date  . Allergy   . Anxiety   . Asthma   . CHF (congestive heart failure) ([place])   . Cocaine abuse ([place])   . COPD (chronic obstructive pulmonary disease) ([place])   . Coronary artery disease   . Emphysema/COPD ([place])   . Hepatitis C   . High cholesterol   . Hypertension   . Pneumothorax   . Tobacco abuse     Patient Active Problem List   Diagnosis Date Noted  . Panic disorder 10/29/2017  . Altered mental status   . Pressure injury of skin 10/23/2017  . Acute on chronic respiratory failure ([place]) 09/30/2017  . Acute respiratory failure ([place]) 08/20/2017  . Acute on chronic respiratory failure with hypoxia and hypercapnia ([place]) 06/29/2017  . Acute on chronic respiratory failure with hypoxia ([place]) 02/23/2017  . Protein-calorie malnutrition, severe 05/23/2016  . Sepsis ([place]) 05/22/2016  . Malnutrition of moderate degree ([place]) 10/25/2014  . COPD exacerbation ([place]) 10/24/2014  . Tobacco abuse 10/24/2014    Past Surgical History:  Procedure Laterality Date  . CARDIAC  CATHETERIZATION    . CHEST TUBE INSERTION    . IR GASTROSTOMY TUBE MOD SED  10/31/2017  . TRACHEOSTOMY TUBE PLACEMENT N/A 10/17/2017   Procedure: TRACHEOSTOMY;  Surgeon: Carloyn Manner, MD;  Location: [facility] ORS;  Service: ENT;  Laterality: N/A;    Prior to Admission medications   Medication Sig Start Date End Date Taking? Authorizing Provider  AMBULATORY NON FORMULARY MEDICATION Medication Name: Pulse oximeter DME:AHC DX J44.9....J96.21 01/08/18   Flora Lipps, MD  budesonide (PULMICORT) 0.25 MG/2ML nebulizer solution Take 2 mLs (0.25 mg total) by nebulization 2 (two) times daily. 12/31/17   Conforti, John, DO  clonazePAM (KLONOPIN) 0.5 MG tablet Place 1 tablet (0.5 mg total) into feeding tube daily. Patient not taking: Reported on 07/28/2018 01/07/18   Flora Lipps, MD  clonazePAM (KLONOPIN) 1 MG tablet Place 1 tablet (1 mg total) into feeding tube at bedtime. 12/31/17   Conforti, John, DO  doxycycline (VIBRA-TABS) 100 MG tablet Place 1 tablet (100 mg total) into feeding tube every 12 (twelve) hours. Patient not taking: Reported on 07/28/2018 12/31/17   [name], DO  famotidine (PEPCID) 20 MG tablet Place 1 tablet (20 mg total) into feeding tube daily. 12/31/17   Conforti, John, DO  fentaNYL (DURAGESIC - DOSED MCG/HR) 25 MCG/HR patch Place 1 patch (25 mcg total) onto the skin every 3 (three) days. 01/07/18   Flora Lipps, MD  ipratropium-albuterol (DUONEB) 0.5-2.5 (3) MG/3ML SOLN Take 3 mLs by nebulization every 6 (six) hours. 12/31/17  Conforti, John, DO  oxyCODONE-acetaminophen (PERCOCET/ROXICET) 5-325 MG tablet Place 1 tablet into feeding tube every 8 (eight) hours as needed for severe pain. 01/07/18   Flora Lipps, MD  PARoxetine (PAXIL) 20 MG tablet Place 1 tablet (20 mg total) into feeding tube at bedtime. 12/31/17   Conforti, John, DO  QUEtiapine (SEROQUEL) 25 MG tablet Place 1 tablet (25 mg total) into feeding tube at bedtime. 12/31/17   Conforti, John, DO    Allergies Carvedilol  Family  History  Problem Relation Age of Onset  . Lung cancer Mother   . Asthma Mother   . CVA Father   . CAD Father   . Stroke Father   . Breast cancer Maternal Aunt 67  . COPD Sister     Social History Social History   Tobacco Use  . Smoking status: Current Every Day Smoker    Packs/day: 0.10    Years: 41.00    Pack years: 4.10    Types: Cigarettes  . Smokeless tobacco: Never Used  Substance Use Topics  . Alcohol use: No    Frequency: Never  . Drug use: Yes    Types: "Crack" cocaine    Comment: Past cocaine use.  Quit 04/2017    Review of Systems  Constitutional: Positive for fever. Eyes: No visual changes. ENT: No sore throat. Cardiovascular: Denies chest pain. Respiratory: Positive for shortness of breath. Gastrointestinal: No abdominal pain.  No nausea, no vomiting.  No diarrhea.  No constipation. Genitourinary: Negative for dysuria. Musculoskeletal: Negative for back pain. Skin: Negative for rash. Neurological: Negative for headaches, focal weakness or numbness.   ____________________________________________   PHYSICAL EXAM:  VITAL SIGNS: ED Triage Vitals  Enc Vitals Group     BP      Pulse      Resp      Temp      Temp src      SpO2      Weight      Height      Head Circumference      Peak Flow      Pain Score      Pain Loc      Pain Edu?      Excl. in [place]?     Constitutional: Alert and oriented.  Chronically ill appearing and in moderate acute distress. Eyes: Conjunctivae are normal. PERRL. EOMI. Head: Atraumatic. Nose: No congestion/rhinnorhea. Mouth/Throat: Mucous membranes are moist.  Oropharynx non-erythematous. Neck: No stridor.  Supple neck without meningismus. Cardiovascular: Tachycardic rate, regular rhythm. Grossly normal heart sounds.  Good peripheral circulation. Respiratory: Increased respiratory effort.  No retractions. Lungs with rhonchi and wheezing. Gastrointestinal: Soft and nontender.  G tube.  No distention. No abdominal  bruits. No CVA tenderness. Musculoskeletal: No lower extremity tenderness nor edema.  No joint effusions. Neurologic:  Normal speech and language. No gross focal neurologic deficits are appreciated.  Skin:  Skin is hot, dry and intact. No rash noted.  No petechiae. Psychiatric: Mood and affect are normal. Speech and behavior are normal.  ____________________________________________   LABS (all labs ordered are listed, but only abnormal results are displayed)  Labs Reviewed  LACTIC ACID, PLASMA - Abnormal; Notable for the following components:      Result Value   Lactic Acid, Venous 2.9 (*)    All other components within normal limits  COMPREHENSIVE METABOLIC PANEL - Abnormal; Notable for the following components:   Glucose, Bld 101 (*)    Total Protein 8.3 (*)    All  other components within normal limits  CBC WITH DIFFERENTIAL/PLATELET - Abnormal; Notable for the following components:   WBC 12.4 (*)    Neutro Abs 10.3 (*)    All other components within normal limits  URINALYSIS, ROUTINE W REFLEX MICROSCOPIC - Abnormal; Notable for the following components:   Color, Urine STRAW (*)    APPearance CLEAR (*)    Hgb urine dipstick SMALL (*)    Bacteria, UA RARE (*)    All other components within normal limits  BLOOD GAS, ARTERIAL - Abnormal; Notable for the following components:   pH, Arterial 7.46 (*)    pO2, Arterial 67 (*)    Acid-Base Excess 2.5 (*)    All other components within normal limits  CULTURE, BLOOD (ROUTINE X 2)  CULTURE, BLOOD (ROUTINE X 2)  URINE CULTURE  SARS CORONAVIRUS 2 (HOSPITAL ORDER, [facility])  LACTIC ACID, PLASMA  BRAIN NATRIURETIC PEPTIDE  PROCALCITONIN   ____________________________________________  EKG  ED ECG REPORT I, Komal Stangelo J, the attending physician, personally viewed and interpreted this ECG.   Date: 09/17/2018  EKG Time: 0118  Rate: 125  Rhythm: sinus tachycardia  Axis: Normal  Intervals:none  ST&T  Change: Nonspecific  ____________________________________________  RADIOLOGY  ED MD interpretation: Left lower lobe pneumonia  Official radiology report(s): Dg Chest Port 1 View  Result Date: 09/17/2018 CLINICAL DATA:  58 y/o  F; shortness of breath. EXAM: PORTABLE CHEST 1 VIEW COMPARISON:  12/30/2017 chest radiograph FINDINGS: Stable position of tracheostomy tube with tip projecting over the tracheal air column. Hyperinflated lungs and chronic bronchitic changes. Stable cardiac silhouette within normal limits given projection and technique. Left basilar streaky opacities. No pleural effusion or pneumothorax. No acute osseous abnormality is evident. IMPRESSION: Stable findings of COPD. Left basilar streaky opacities may represent atelectasis or developing infiltrate. Electronically Signed   By: Kristine Garbe M.D.   On: 09/17/2018 02:06    ____________________________________________   PROCEDURES  Procedure(s) performed (including Critical Care):  Procedures  CRITICAL CARE Performed by: Paulette Blanch   Total critical care time: 45 minutes  Critical care time was exclusive of separately billable procedures and treating other patients.  Critical care was necessary to treat or prevent imminent or life-threatening deterioration.  Critical care was time spent personally by me on the following activities: development of treatment plan with patient and/or surrogate as well as nursing, discussions with consultants, evaluation of patient's response to treatment, examination of patient, obtaining history from patient or surrogate, ordering and performing treatments and interventions, ordering and review of laboratory studies, ordering and review of radiographic studies, pulse oximetry and re-evaluation of patient's condition. ____________________________________________   INITIAL IMPRESSION / ASSESSMENT AND PLAN / ED COURSE  As part of my medical decision making, I reviewed the  following data within the [place] notes reviewed and incorporated, Labs reviewed, EKG interpreted, Old chart reviewed, Radiograph reviewed, Discussed with admitting physician and Notes from prior ED visits        58 year old female with tracheostomy on home ventilator who presents with shortness of breath, fever and hypoxia. Differential includes, but is not limited to, viral syndrome, bronchitis including COPD exacerbation, pneumonia, reactive airway disease including asthma, CHF including exacerbation with or without pulmonary/interstitial edema, pneumothorax, ACS, thoracic trauma, and pulmonary embolism.  Michelle [name] was evaluated in Emergency Department on 09/17/2018 for the symptoms described in the history of present illness. She was evaluated in the context of the global COVID-19 pandemic, which necessitated  consideration that the patient might be at risk for infection with the SARS-CoV-2 virus that causes COVID-19. Institutional protocols and algorithms that pertain to the evaluation of patients at risk for COVID-19 are in a state of rapid change based on information released by regulatory bodies including the CDC and federal and state organizations. These policies and algorithms were followed during the patient's care in the ED.  Personally reviewed patient's records and see that she had an ED visit in March at [facility] for tracheitis.  She was treated with doxycycline.  Suspect sepsis, but also in the area of COVID, will obtain swab for COVID rule out.  Anticipate hospitalization.   Clinical Course as of Sep 16 620  Wed Sep 17, 2018  0249 Updated patient of all test results.  Will discuss with hospitalist Dr. Sidney Ace to evaluate patient in the emergency department for admission.   [JS]  [id number] Discussed with Dr. Sidney Ace of hospital services who is requesting that we wait for the results of COVID testing before admission.  If positive, patient will be  transferred to [facility].  If negative, patient will be admitted here.   [JS]  0430 Fever improved.  Patient had an episode of increased shortness of breath.  RT at bedside suctioning the patient.  Lung sounds slightly diminished bilaterally.  Will administer DuoNeb.  Holding IV Solu-Medrol until results of COVID test.  Will also repeat chest x-ray.   [JS]  0500 Repeat ABG and chest x-ray noted.  Hypoventilation may have been secondary to patient remaining on her home vents.  She was switched to the hospital ventilator with success.   [JS]  8882 Covid negative. Will administer IV solumedrol.  Discussed with Dr. Sidney Ace who will evaluate patient in the emergency department for admission.   [JS]    Clinical Course User Index [JS] Paulette Blanch, MD     ____________________________________________   FINAL CLINICAL IMPRESSION(S) / ED DIAGNOSES  Final diagnoses:  Fever, unspecified fever cause  Hypoxia  Community acquired pneumonia of left lower lobe of lung ([place])  Sepsis, due to unspecified organism, unspecified whether acute organ dysfunction present [facility])     ED Discharge Orders    None       Note:  This document was prepared using Dragon voice recognition software and may include unintentional dictation errors.   Paulette Blanch, MD 09/17/18 [phone]

## 2018-09-17 NOTE — Progress Notes (Signed)
PT remains on home vent per DR.jade sung   , vent is plugged into  redoutlet .

## 2018-09-17 NOTE — ED Notes (Signed)
After having vent changed to hospital vent (was on home vent prior), and having duonebs, patient's status improving now and patient able to answer questions. Patient seems tired, but states she feels some better after episode. Patient required bagging through trach during this episode and CO2 level reached 105.

## 2018-09-17 NOTE — TOC Progression Note (Signed)
Transition of Care [facility]) - Progression Note    Patient Details  Name: Michelle [name] MRN: [id number] Date of Birth: 04/10/1961  Transition of Care [facility]) CM/SW Contact  Beverly Sessions, RN Phone Number: 09/17/2018, 11:14 AM  Clinical Narrative:     Patient admitted from home with PNA. Voicemail left for daughter [name]. Attempted to call daughter [name] (number no longer working) in order to complete assessment.  Awaiting return call.   Per chart review patient lives at home with daughters.  PCP [place].  Pharmacy - [facility]. Chronic Trach with home vent.  Previously documented that patient had home O2 with Junior.         Expected Discharge Plan and Services                                     Social Determinants of Health (SDOH) Interventions    Readmission Risk Interventions No flowsheet data found.

## 2018-09-17 NOTE — ED Triage Notes (Signed)
Patient to ER from home via [facility] for c/o shortness of breath. Per EMS, patient had fever of 102.1 axillary. Patient is on vent at 7 liters. Patient received saline neb at home with no relief. Patient was 86% upon EMS arrival to home. Patient has had increase in shortness of breath in last two hours. Patient has had clear suctioned sputum at home. Patient also c/o sharp epigastric pain.

## 2018-09-17 NOTE — Progress Notes (Signed)
CODE SEPSIS - PHARMACY COMMUNICATION  **Broad Spectrum Antibiotics should be administered within 1 hour of Sepsis diagnosis**  Time Code Sepsis Called/Page Received: @ 0105  Antibiotics Ordered: Cefepime and vancomycin   Time of 1st antibiotic administration: @ 0200  Additional action taken by pharmacy: Spoke with RN @ 0134 about time left to start Abx for Code Sepsis   If necessary, Name of Provider/Nurse Contacted: Anderson Malta, RN  Pernell Dupre, PharmD, BCPS Clinical Pharmacist 09/17/2018 2:19 AM

## 2018-09-17 NOTE — Progress Notes (Signed)
Dr. Ignacia Marvel at beside to perform routine trach change.  Trach change successful with [place]iley 6DCT.

## 2018-09-17 NOTE — ED Notes (Signed)
Patient continues to be able to answer questions appropriately and states she feels much better. Patient also continues to have dry cough. Awaiting admission to hospital at this time. Patient aware of delay. No other needs at this time.

## 2018-09-17 NOTE — H&P (Addendum)
[facility] NAME: Michelle [name]    MR#:  [id number]  DATE OF BIRTH:  05/08/1961  DATE OF ADMISSION:  09/17/2018  PRIMARY CARE PHYSICIAN: Frazier Richards, MD   REQUESTING/REFERRING PHYSICIAN: Lurline Hare, MD CHIEF COMPLAINT:   Chief Complaint  Patient presents with   Shortness of Breath   Fever    HISTORY OF PRESENT ILLNESS:  Michelle [name]  is a 58 y.o. female with a known history of asthma/COPD with chronic respiratory failure status post tracheostomy on chronic vent at home, and anxiety and CHF, who presented emergency room with the onset of dyspnea with associated congested dry cough and wheezing for the last couple of days, significantly worsening since last night.  No rhinorrhea or nasal congestion or sore throat or earache.  She has been feeling thirsty.  She admits to fever and chills.  No nausea or vomiting or abdominal pain.  No dysuria, oliguria or hematuria or flank pain.   Upon presentation to the emergency room, blood pressure was elevated 179/87 and she was febrile with a temperature of 103.4 rectally and tachycardic with a heart rate of 134.  Respiratory rate was 19 and O2 sat was 95% on her home vent.  Labs revealed initially an ABG with pH 7.46 and later 7.21, PCO2 37 and later 55, PO2 of 67 later 122 and O2 sat of 94.1% and later 97.9%.  CMP was essentially unremarkable.  BNP was 44.  Lactic acid was elevated at 2.9 and later 1.2.  Procalcitonin was 0.19 and CBC showed leukocytosis of 12.4 with 83% neutrophils and absolute neutrophils 10.3%.  Portable chest x-ray as below showed suspected left lower lobe pneumonia. A rapid COVID 19 test was ordered and came back negative.  The patient was given IV cefepime, IV vancomycin and Flagyl, duo nebs, 2 L bolus of IV normal saline, rectal 975 mg Tylenol and 125 mg of IV Solu-Medrol.  She will be admitted to an ICU bed for further evaluation and management. PAST MEDICAL HISTORY:    Past Medical History:  Diagnosis Date   Allergy    Anxiety    Asthma    CHF (congestive heart failure) (HCC)    Cocaine abuse (HCC)    COPD (chronic obstructive pulmonary disease) (HCC)    Coronary artery disease    Emphysema/COPD (HCC)    Hepatitis C    High cholesterol    Hypertension    Pneumothorax    Tobacco abuse     PAST SURGICAL HISTORY:   Past Surgical History:  Procedure Laterality Date   CARDIAC CATHETERIZATION     CHEST TUBE INSERTION     IR GASTROSTOMY TUBE MOD SED  10/31/2017   TRACHEOSTOMY TUBE PLACEMENT N/A 10/17/2017   Procedure: TRACHEOSTOMY;  Surgeon: Carloyn Manner, MD;  Location: [facility] ORS;  Service: ENT;  Laterality: N/A;    SOCIAL HISTORY:   Social History   Tobacco Use   Smoking status: Current Every Day Smoker    Packs/day: 0.10    Years: 41.00    Pack years: 4.10    Types: Cigarettes   Smokeless tobacco: Never Used  Substance Use Topics   Alcohol use: No    Frequency: Never    FAMILY HISTORY:   Family History  Problem Relation Age of Onset   Lung cancer Mother    Asthma Mother    CVA Father    CAD Father    Stroke Father    Breast  cancer Maternal Aunt 67   COPD Sister     DRUG ALLERGIES:  No Active Allergies  REVIEW OF SYSTEMS:   ROS As per history of present illness. All pertinent systems were reviewed above. Constitutional,  HEENT, cardiovascular, respiratory, GI, GU, musculoskeletal, neuro, psychiatric, endocrine,  integumentary and hematologic systems were reviewed and are otherwise  negative/unremarkable except for positive findings mentioned above in the HPI.   MEDICATIONS AT HOME:   Prior to Admission medications   Medication Sig Start Date End Date Taking? Authorizing Provider  AMBULATORY NON FORMULARY MEDICATION Medication Name: Pulse oximeter DME:[facility] DX J44.9....J96.21 01/08/18  Yes Flora Lipps, MD  aspirin 81 MG chewable tablet Chew 81 mg by mouth daily.   Yes [provider]  budesonide (PULMICORT) 0.25 MG/2ML nebulizer solution Take 2 mLs (0.25 mg total) by nebulization 2 (two) times daily. 12/31/17  Yes Conforti, John, DO  carvedilol (COREG) 3.125 MG tablet Take 3.125 mg by mouth 2 (two) times daily with a meal.   Yes [provider]  cetirizine (ZYRTEC) 10 MG tablet Take 10 mg by mouth daily.   Yes [provider]  clonazePAM (KLONOPIN) 0.5 MG tablet Place 1 tablet (0.5 mg total) into feeding tube daily. Patient taking differently: Take 1 mg by mouth 2 (two) times daily. Takes 1 tablet bid and prn for anxiety 01/07/18  Yes Kasa, Maretta Bees, MD  enalapril (VASOTEC) 5 MG tablet Take 5 mg by mouth daily.   Yes [provider]  famotidine (PEPCID) 20 MG tablet Place 1 tablet (20 mg total) into feeding tube daily. 12/31/17  Yes Conforti, John, DO  fentaNYL (DURAGESIC - DOSED MCG/HR) 25 MCG/HR patch Place 1 patch (25 mcg total) onto the skin every 3 (three) days. 01/07/18  Yes Kasa, Maretta Bees, MD  fluticasone (FLONASE) 50 MCG/ACT nasal spray Place 2 sprays into both nostrils daily.   Yes [provider]  ipratropium-albuterol (DUONEB) 0.5-2.5 (3) MG/3ML SOLN Take 3 mLs by nebulization every 6 (six) hours. 12/31/17  Yes Conforti, John, DO  levalbuterol (XOPENEX) 1.25 MG/3ML nebulizer solution Take 1.25 mg by nebulization every 4 (four) hours as needed for wheezing.   Yes [provider]  oxyCODONE-acetaminophen (PERCOCET/ROXICET) 5-325 MG tablet Place 1 tablet into feeding tube every 8 (eight) hours as needed for severe pain. 01/07/18  Yes Flora Lipps, MD  PARoxetine (PAXIL) 20 MG tablet Place 1 tablet (20 mg total) into feeding tube at bedtime. 12/31/17  Yes Conforti, John, DO  QUEtiapine (SEROQUEL) 25 MG tablet Place 1 tablet (25 mg total) into feeding tube at bedtime. 12/31/17  Yes Conforti, John, DO  senna (SENOKOT) 8.6 MG TABS tablet Take 1 tablet by mouth daily as needed for mild constipation.   Yes [provider]    simvastatin (ZOCOR) 20 MG tablet Take 40 mg by mouth daily at 6 PM.   Yes [provider]  doxycycline (VIBRA-TABS) 100 MG tablet Place 1 tablet (100 mg total) into feeding tube every 12 (twelve) hours. Patient not taking: Reported on 07/28/2018 12/31/17   [name], DO  mirtazapine (REMERON) 45 MG tablet Take 45 mg by mouth at bedtime.    [provider]      VITAL SIGNS:  Blood pressure 117/60, pulse (!) 122, temperature (!) 100.6 F (38.1 C), temperature source Oral, resp. rate 18, height 4\' 9"  (1.448 m), weight 62.1 kg, last menstrual period 03/06/2005, SpO2 97 %.  PHYSICAL EXAMINATION:  Physical Exam  GENERAL:  58 y.o.-year-old patient lying in the bed  with no acute distress.  EYES: Pupils equal, round, reactive to light and accommodation. No scleral icterus. Extraocular muscles intact.  HEENT: Head atraumatic, normocephalic. Oropharynx and nasopharynx clear.  NECK:  Supple, no jugular venous distention. No thyroid enlargement, no tenderness.  LUNGS: Diminished bibasilar breath sounds with left basal crackles and expiratory wheezes with diminished expiratory airflow. CARDIOVASCULAR: Regular rate and rhythm, S1, S2 normal. No murmurs, rubs, or gallops.  ABDOMEN: Soft, nondistended, nontender. Bowel sounds present. No organomegaly or mass.  G-tube in place. EXTREMITIES: No pedal edema, cyanosis, or clubbing.  NEUROLOGIC: Cranial nerves II through XII are intact. Muscle strength 5/5 in all extremities. Sensation intact. Gait not checked.  PSYCHIATRIC: The patient is alert and oriented x 3.  Normal affect and good eye contact. SKIN: No obvious rash, lesion, or ulcer.   LABORATORY PANEL:   CBC Recent Labs  Lab 09/17/18 0120  WBC 12.4*  HGB 12.5  HCT 41.6  PLT 239   ------------------------------------------------------------------------------------------------------------------  Chemistries  Recent Labs  Lab 09/17/18 0120  NA 142  K 3.7  CL 105   CO2 25  GLUCOSE 101*  BUN 16  CREATININE 0.64  CALCIUM 9.2  AST 21  ALT 16  ALKPHOS 116  BILITOT 0.5   ------------------------------------------------------------------------------------------------------------------  Cardiac Enzymes No results for input(s): TROPONINI in the last 168 hours. ------------------------------------------------------------------------------------------------------------------  RADIOLOGY:  Dg Chest Port 1 View  Result Date: 09/17/2018 CLINICAL DATA:  Progressive shortness of breath. EXAM: PORTABLE CHEST 1 VIEW COMPARISON:  One-view chest x-ray 1:19 a.m. FINDINGS: The heart size is normal. Prominence the pulmonary arteries is again noted. Tracheostomy tube is stable. Left lower lobe airspace disease is similar the prior exam. Changes of COPD are again noted. IMPRESSION: 1. Stable left lower lobe airspace disease concerning for infection. Electronically Signed   By: San Morelle M.D.   On: 09/17/2018 05:06   Dg Chest Port 1 View  Result Date: 09/17/2018 CLINICAL DATA:  58 y/o  F; shortness of breath. EXAM: PORTABLE CHEST 1 VIEW COMPARISON:  12/30/2017 chest radiograph FINDINGS: Stable position of tracheostomy tube with tip projecting over the tracheal air column. Hyperinflated lungs and chronic bronchitic changes. Stable cardiac silhouette within normal limits given projection and technique. Left basilar streaky opacities. No pleural effusion or pneumothorax. No acute osseous abnormality is evident. IMPRESSION: Stable findings of COPD. Left basilar streaky opacities may represent atelectasis or developing infiltrate. Electronically Signed   By: Kristine Garbe M.D.   On: 09/17/2018 02:06      IMPRESSION AND PLAN:   #1.  Acute on top of chronic hypoxic and hypercarbic respiratory failure secondary to left lower lobe pneumonia.  The patient will be maintained on mechanical ventilation and admitted to an ICU bed.  She will be continued on IV  cefepime, Zithromax and vancomycin.  Will obtain influenza antigens as well as Legionella and pneumonia antigens.  Scheduled and PRN duo nebs will be provided.  Mucolytic therapy will be provided.  O2 protocol will be followed.  We will follow her ABG.  I notified the ER intensivist.  2.  Left lower lobe pneumonia likely bacterial possibly gram-negative with subsequent sepsis without severe sepsis or septic shock.  Management as above.  Blood cultures and tracheal sputum Gram stain, culture and sensitivity will be followed.  The patient's COVID-19 test came back negative.  3.  Acute asthma/COPD exacerbation.  She will be placed on scheduled and PRN duo nebs as well as scheduled IV Solu-Medrol.  We will hold off her Symbicort  and continue her Pulmicort.  We will continue her Spiriva.  4.  Hypertension.  Coreg and Vasotec will be resumed.  5..  Anxiety/depression.  We will continue her Klonopin, Paxil and Seroquel.  6.  Dyslipidemia.  Zocor will be resumed.  7.  DVT prophylaxis.  Subcutaneous Lovenox and GI prophylaxis with continued H2 blocker therapy especially given current steroid therapy.    All the records are reviewed and case discussed with ED provider. The plan of care was discussed in details with the patient (and family). I answered all questions. The patient agreed to proceed with the above mentioned plan. Further management will depend upon hospital course.   CODE STATUS: Full code  TOTAL CRITICAL CARE TIME TAKING CARE OF THIS PATIENT: 55 minutes.    Christel Mormon M.D on 09/17/2018 at 6:32 AM  Pager - [phone]  After 6pm go to [email or web address] - [employer] [facility]  Office  [phone]  CC: Primary care physician; Frazier Richards, MD   Note: This dictation was prepared with Dragon dictation along with smaller phrase technology. Any transcriptional errors that result from this process are unintentional.

## 2018-09-17 NOTE — ED Notes (Signed)
Report given to Jessica, RN.

## 2018-09-17 NOTE — ED Notes (Signed)
ED TO INPATIENT HANDOFF REPORT  ED Nurse Name and Phone #: Janett Billow [phone]  S Name/Age/Gender Michelle [name] 58 y.o. female Room/Bed: ED01A/ED01A  Code Status   Code Status: Full Code  Home/SNF/Other Home Patient oriented to: self, place, time and situation Is this baseline? Yes   Triage Complete: Triage complete  Chief Complaint SOB  Triage Note Patient to ER from home via [facility] for c/o shortness of breath. Per EMS, patient had fever of 102.1 axillary. Patient is on vent at 7 liters. Patient received saline neb at home with no relief. Patient was 86% upon EMS arrival to home. Patient has had increase in shortness of breath in last two hours. Patient has had clear suctioned sputum at home. Patient also c/o sharp epigastric pain.    Allergies No Active Allergies  Level of Care/Admitting Diagnosis ED Disposition    ED Disposition Condition Comment   Admit  Hospital Area: [facility] [[id number]]  Level of Care: ICU [6]  Diagnosis: Pneumonia [[id number]]  Admitting Physician: Christel Mormon [[id number]]  Attending Physician: Christel Mormon [[id number]]  Estimated length of stay: 3 - 4 days  Certification:: I certify this patient will need inpatient services for at least 2 midnights  Possible Covid Disease Patient Isolation: N/A  PT Class (Do Not Modify): Inpatient [101]  PT Acc Code (Do Not Modify): Private [1]       B Medical/Surgery History Past Medical History:  Diagnosis Date  . Allergy   . Anxiety   . Asthma   . CHF (congestive heart failure) ([place])   . Cocaine abuse ([place])   . COPD (chronic obstructive pulmonary disease) ([place])   . Coronary artery disease   . Emphysema/COPD ([place])   . Hepatitis C   . High cholesterol   . Hypertension   . Pneumothorax   . Tobacco abuse    Past Surgical History:  Procedure Laterality Date  . CARDIAC CATHETERIZATION    . CHEST TUBE INSERTION    . IR GASTROSTOMY TUBE MOD SED  10/31/2017  . TRACHEOSTOMY TUBE PLACEMENT N/A  10/17/2017   Procedure: TRACHEOSTOMY;  Surgeon: Carloyn Manner, MD;  Location: [facility] ORS;  Service: ENT;  Laterality: N/A;     A IV Location/Drains/Wounds Patient Lines/Drains/Airways Status   Active Line/Drains/Airways    Name:   Placement date:   Placement time:   Site:   Days:   Peripheral IV 09/17/18 Right Wrist   09/17/18    0130    Wrist   less than 1   Peripheral IV 09/17/18 Left Hand   09/17/18    0146    Hand   less than 1   Gastrostomy/Enterostomy Gastrostomy 20 Fr. LUQ   10/31/17    1448    LUQ   321   Urethral Catheter Delsa Sale D, RN Latex 16 Fr.   09/17/18    0143    Latex   less than 1   Tracheostomy Shiley 6 mm Cuffed   10/17/17    1140    6 mm   335          Intake/Output Last 24 hours  Intake/Output Summary (Last 24 hours) at 09/17/2018 0716 Last data filed at 09/17/2018 0440 Gross per 24 hour  Intake 2450 ml  Output -  Net 2450 ml    Labs/Imaging Results for orders placed or performed during the hospital encounter of 09/17/18 (from the past 48 hour(s))  Blood gas, arterial ([facility], [facility], [facility])  Status: Abnormal   Collection Time: 09/17/18  1:13 AM  Result Value Ref Range   FIO2 0.40    Delivery systems VENTILATOR    Mode ASSIST CONTROL    VT 450 mL   Peep/cpap 5.0 cm H20   pH, Arterial 7.46 (H) 7.350 - 7.450   pCO2 arterial 37 32.0 - 48.0 mmHg   pO2, Arterial 67 (L) 83.0 - 108.0 mmHg   Bicarbonate 26.3 20.0 - 28.0 mmol/L   Acid-Base Excess 2.5 (H) 0.0 - 2.0 mmol/L   O2 Saturation 94.1 %   Patient temperature 37.0    Collection site RIGHT RADIAL    Sample type ARTERIAL DRAW    Allens test (pass/fail) PASS PASS   Mechanical Rate 16     Comment: Performed at [facility], [address] [place], [place] [address]  Lactic acid, plasma     Status: Abnormal   Collection Time: 09/17/18  1:20 AM  Result Value Ref Range   Lactic Acid, Venous 2.9 (HH) 0.5 - 1.9 mmol/L    Comment: CRITICAL RESULT CALLED TO, READ BACK BY AND VERIFIED WITH JENNIFER  INGERSOLL AT 0947 09/17/2018 SMA Performed at [facility], [address] [place], [place] [address]   Comprehensive metabolic panel     Status: Abnormal   Collection Time: 09/17/18  1:20 AM  Result Value Ref Range   Sodium 142 135 - 145 mmol/L   Potassium 3.7 3.5 - 5.1 mmol/L   Chloride 105 98 - 111 mmol/L   CO2 25 22 - 32 mmol/L   Glucose, Bld 101 (H) 70 - 99 mg/dL   BUN 16 6 - 20 mg/dL   Creatinine, Ser 0.64 0.44 - 1.00 mg/dL   Calcium 9.2 8.9 - 10.3 mg/dL   Total Protein 8.3 (H) 6.5 - 8.1 g/dL   Albumin 4.1 3.5 - 5.0 g/dL   AST 21 15 - 41 U/L   ALT 16 0 - 44 U/L   Alkaline Phosphatase 116 38 - 126 U/L   Total Bilirubin 0.5 0.3 - 1.2 mg/dL   GFR calc non Af Amer >60 >60 mL/min   GFR calc Af Amer >60 >60 mL/min   Anion gap 12 5 - 15    Comment: Performed at [facility], [address] [place], [place] [address]  CBC WITH DIFFERENTIAL     Status: Abnormal   Collection Time: 09/17/18  1:20 AM  Result Value Ref Range   WBC 12.4 (H) 4.0 - 10.5 K/uL   RBC 4.65 3.87 - 5.11 MIL/uL   Hemoglobin 12.5 12.0 - 15.0 g/dL   HCT 41.6 36.0 - 46.0 %   MCV 89.5 80.0 - 100.0 fL   MCH 26.9 26.0 - 34.0 pg   MCHC 30.0 30.0 - 36.0 g/dL   RDW 14.0 11.5 - 15.5 %   Platelets 239 150 - 400 K/uL   nRBC 0.0 0.0 - 0.2 %   Neutrophils Relative % 83 %   Neutro Abs 10.3 (H) 1.7 - 7.7 K/uL   Lymphocytes Relative 9 %   Lymphs Abs 1.2 0.7 - 4.0 K/uL   Monocytes Relative 7 %   Monocytes Absolute 0.9 0.1 - 1.0 K/uL   Eosinophils Relative 1 %   Eosinophils Absolute 0.1 0.0 - 0.5 K/uL   Basophils Relative 0 %   Basophils Absolute 0.0 0.0 - 0.1 K/uL   Immature Granulocytes 0 %   Abs Immature Granulocytes 0.04 0.00 - 0.07 K/uL    Comment: Performed at  [facility], [address] [place], [place] [address]  Urinalysis, Routine w reflex microscopic     Status: Abnormal   Collection Time: 09/17/18  1:20 AM  Result Value Ref Range   Color, Urine STRAW (A) YELLOW    APPearance CLEAR (A) CLEAR   Specific Gravity, Urine 1.012 1.005 - 1.030   pH 7.0 5.0 - 8.0   Glucose, UA NEGATIVE NEGATIVE mg/dL   Hgb urine dipstick SMALL (A) NEGATIVE   Bilirubin Urine NEGATIVE NEGATIVE   Ketones, ur NEGATIVE NEGATIVE mg/dL   Protein, ur NEGATIVE NEGATIVE mg/dL   Nitrite NEGATIVE NEGATIVE   Leukocytes,Ua NEGATIVE NEGATIVE   RBC / HPF 0-5 0 - 5 RBC/hpf   WBC, UA 0-5 0 - 5 WBC/hpf   Bacteria, UA RARE (A) NONE SEEN   Squamous Epithelial / LPF NONE SEEN 0 - 5    Comment: Performed at [facility], [address] [place], [place] [address]  Brain natriuretic peptide     Status: None   Collection Time: 09/17/18  1:20 AM  Result Value Ref Range   B Natriuretic Peptide 44.0 0.0 - 100.0 pg/mL    Comment: Performed at [facility], [address] [place], [place] [address]  Procalcitonin     Status: None   Collection Time: 09/17/18  1:20 AM  Result Value Ref Range   Procalcitonin 0.19 ng/mL    Comment:        Interpretation: PCT (Procalcitonin) <= 0.5 ng/mL: Systemic infection (sepsis) is not likely. Local bacterial infection is possible. (NOTE)       Sepsis PCT Algorithm           Lower Respiratory Tract                                      Infection PCT Algorithm    ----------------------------     ----------------------------         PCT < 0.25 ng/mL                PCT < 0.10 ng/mL         Strongly encourage             Strongly discourage   discontinuation of antibiotics    initiation of antibiotics    ----------------------------     -----------------------------       PCT 0.25 - 0.50 ng/mL            PCT 0.10 - 0.25 ng/mL               OR       >80% decrease in PCT            Discourage initiation of                                            antibiotics      Encourage discontinuation           of antibiotics    ----------------------------     -----------------------------         PCT >= 0.50 ng/mL              PCT 0.26 - 0.50 ng/mL                AND        <  80% decrease in PCT             Encourage initiation of                                             antibiotics       Encourage continuation           of antibiotics    ----------------------------     -----------------------------        PCT >= 0.50 ng/mL                  PCT > 0.50 ng/mL               AND         increase in PCT                  Strongly encourage                                      initiation of antibiotics    Strongly encourage escalation           of antibiotics                                     -----------------------------                                           PCT <= 0.25 ng/mL                                                 OR                                        > 80% decrease in PCT                                     Discontinue / Do not initiate                                             antibiotics Performed at [facility], [address] [place], [place] [address]   SARS Coronavirus 2 [facility] order, Performed in [place] hospital lab)     Status: None   Collection Time: 09/17/18  1:20 AM  Result Value Ref Range   SARS Coronavirus 2 NEGATIVE NEGATIVE    Comment: (NOTE) If result is NEGATIVE SARS-CoV-2 target nucleic acids are NOT DETECTED. The SARS-CoV-2 RNA is generally detectable in upper and lower  respiratory specimens during the acute phase of infection. The lowest  concentration of SARS-CoV-2 viral copies this assay can detect is 250  copies / mL. A negative result does not preclude  SARS-CoV-2 infection  and should not be used as the sole basis for treatment or other  patient management decisions.  A negative result may occur with  improper specimen collection / handling, submission of specimen other  than nasopharyngeal swab, presence of viral mutation(s) within the  areas targeted by this assay, and inadequate number of viral copies  (<250 copies / mL). A negative result must be combined  with clinical  observations, patient history, and epidemiological information. If result is POSITIVE SARS-CoV-2 target nucleic acids are DETECTED. The SARS-CoV-2 RNA is generally detectable in upper and lower  respiratory specimens dur ing the acute phase of infection.  Positive  results are indicative of active infection with SARS-CoV-2.  Clinical  correlation with patient history and other diagnostic information is  necessary to determine patient infection status.  Positive results do  not rule out bacterial infection or co-infection with other viruses. If result is PRESUMPTIVE POSTIVE SARS-CoV-2 nucleic acids MAY BE PRESENT.   A presumptive positive result was obtained on the submitted specimen  and confirmed on repeat testing.  While 2019 novel coronavirus  (SARS-CoV-2) nucleic acids may be present in the submitted sample  additional confirmatory testing may be necessary for epidemiological  and / or clinical management purposes  to differentiate between  SARS-CoV-2 and other Sarbecovirus currently known to infect humans.  If clinically indicated additional testing with an alternate test  methodology [phone]) is advised. The SARS-CoV-2 RNA is generally  detectable in upper and lower respiratory sp ecimens during the acute  phase of infection. The expected result is Negative. Fact Sheet for Patients:  StrictlyIdeas.no Fact Sheet for Healthcare Providers: BankingDealers.co.za This test is not yet approved or cleared by the [place] FDA and has been authorized for detection and/or diagnosis of SARS-CoV-2 by FDA under an Emergency Use Authorization (EUA).  This EUA will remain in effect (meaning this test can be used) for the duration of the COVID-19 declaration under Section 564(b)(1) of the Act, 21 U.S.C. section 360bbb-3(b)(1), unless the authorization is terminated or revoked sooner. Performed at [facility], [place] [address], [place], [place] [address]   Lactic acid, plasma     Status: None   Collection Time: 09/17/18  4:02 AM  Result Value Ref Range   Lactic Acid, Venous 1.2 0.5 - 1.9 mmol/L    Comment: Performed at [facility], [address] [place], [place] [address]  Blood gas, arterial     Status: Abnormal   Collection Time: 09/17/18  4:42 AM  Result Value Ref Range   FIO2 50.00    Delivery systems VENTILATOR    Mode PRESSURE REGULATED VOLUME CONTROL    VT 450 mL   Peep/cpap 15.0 cm H20   pH, Arterial 7.21 (L) 7.350 - 7.450   pCO2 arterial 55 (H) 32.0 - 48.0 mmHg   pO2, Arterial 122 (H) 83.0 - 108.0 mmHg   Bicarbonate 22.0 20.0 - 28.0 mmol/L   Acid-base deficit 6.4 (H) 0.0 - 2.0 mmol/L   O2 Saturation 97.9 %   Patient temperature 37.0    Collection site RIGHT RADIAL    Sample type ARTERIAL DRAW    Allens test (pass/fail) PASS PASS   Mechanical Rate 16.0     Comment: Performed at [facility], [address] [place], [place] [address]   Dg Chest Port 1 View  Result Date: 09/17/2018 CLINICAL DATA:  Progressive shortness of breath. EXAM: PORTABLE CHEST 1 VIEW COMPARISON:  One-view chest x-ray 1:19  a.m. FINDINGS: The heart size is normal. Prominence the pulmonary arteries is again noted. Tracheostomy tube is stable. Left lower lobe airspace disease is similar the prior exam. Changes of COPD are again noted. IMPRESSION: 1. Stable left lower lobe airspace disease concerning for infection. Electronically Signed   By: San Morelle M.D.   On: 09/17/2018 05:06   Dg Chest Port 1 View  Result Date: 09/17/2018 CLINICAL DATA:  58 y/o  F; shortness of breath. EXAM: PORTABLE CHEST 1 VIEW COMPARISON:  12/30/2017 chest radiograph FINDINGS: Stable position of tracheostomy tube with tip projecting over the tracheal air column. Hyperinflated lungs and chronic bronchitic changes. Stable cardiac silhouette within normal limits given projection and technique. Left basilar streaky  opacities. No pleural effusion or pneumothorax. No acute osseous abnormality is evident. IMPRESSION: Stable findings of COPD. Left basilar streaky opacities may represent atelectasis or developing infiltrate. Electronically Signed   By: Kristine Garbe M.D.   On: 09/17/2018 02:06    Pending Labs Unresulted Labs (From admission, onward)    Start     Ordered   09/18/18 2951  Basic metabolic panel  Tomorrow morning,   STAT     09/17/18 0631   09/18/18 0500  CBC  Tomorrow morning,   STAT     09/17/18 0631   09/17/18 0830  Blood gas, arterial  Once,   STAT     09/17/18 0631   09/17/18 0800  MRSA PCR Screening  Once,   STAT     09/17/18 0646   09/17/18 0630  Legionella Pneumophila Serogp 1 Ur Ag  Once,   STAT     09/17/18 0631   09/17/18 0630  Influenza panel by PCR (type A & B)  (Influenza PCR Panel)  Once,   STAT    Comments:  If not done    09/17/18 0631   09/17/18 0625  HIV antibody (Routine Testing)  Once,   STAT     09/17/18 0631   09/17/18 0625  Culture, blood (routine x 2) Call MD if unable to obtain prior to antibiotics being given  BLOOD CULTURE X 2,   STAT    Comments:  If blood cultures drawn in Emergency Department - Do not draw and cancel order    09/17/18 0631   09/17/18 0625  Culture, sputum-assessment  Once,   STAT     09/17/18 0631   09/17/18 0625  Gram stain  Once,   STAT     09/17/18 0631   09/17/18 0625  Strep pneumoniae urinary antigen  Once,   STAT     09/17/18 0631   09/17/18 0625  CBC  (enoxaparin (LOVENOX)    CrCl >/= 30 ml/min)  Once,   STAT    Comments:  Baseline for enoxaparin therapy IF NOT ALREADY DRAWN.  Notify MD if PLT < 100 K.    09/17/18 0631   09/17/18 0113  Urine culture  ONCE - STAT,   STAT    Question:  Patient immune status  Answer:  Normal   09/17/18 0113   09/17/18 0112  Blood Culture (routine x 2)  BLOOD CULTURE X 2,   STAT    Question:  Patient immune status  Answer:  Normal   09/17/18 0113          Vitals/Pain Today's  Vitals   09/17/18 0500 09/17/18 0530 09/17/18 0600 09/17/18 0630  BP:  123/63 117/60 106/66  Pulse:  (!) 129 (!) 122   Resp:  (!) 22 18  16  Temp:      TempSrc:      SpO2:  98% 97%   Weight:      Height:      PainSc: 0-No pain       Isolation Precautions Droplet precaution  Medications Medications  vancomycin (VANCOCIN) IVPB 1000 mg/200 mL premix (has no administration in time range)  ceFEPIme (MAXIPIME) 2 g in sodium chloride 0.9 % 100 mL IVPB (has no administration in time range)  aspirin chewable tablet 81 mg (has no administration in time range)  fentaNYL (DURAGESIC) 25 MCG/HR 1 patch (has no administration in time range)  oxyCODONE-acetaminophen (PERCOCET/ROXICET) 5-325 MG per tablet 1 tablet (has no administration in time range)  carvedilol (COREG) tablet 3.125 mg (has no administration in time range)  enalapril (VASOTEC) tablet 5 mg (has no administration in time range)  simvastatin (ZOCOR) tablet 40 mg (has no administration in time range)  mirtazapine (REMERON) tablet 45 mg (has no administration in time range)  PARoxetine (PAXIL) tablet 20 mg (has no administration in time range)  famotidine (PEPCID) tablet 20 mg (has no administration in time range)  QUEtiapine (SEROQUEL) tablet 25 mg (has no administration in time range)  senna (SENOKOT) tablet 8.6 mg (has no administration in time range)  clonazePAM (KLONOPIN) tablet 0.5 mg (has no administration in time range)  budesonide (PULMICORT) nebulizer solution 0.25 mg (has no administration in time range)  loratadine (CLARITIN) tablet 10 mg (has no administration in time range)  fluticasone (FLONASE) 50 MCG/ACT nasal spray 2 spray (has no administration in time range)  ipratropium-albuterol (DUONEB) 0.5-2.5 (3) MG/3ML nebulizer solution 3 mL (has no administration in time range)  levalbuterol (XOPENEX) nebulizer solution 1.25 mg (has no administration in time range)  enoxaparin (LOVENOX) injection 40 mg (has no  administration in time range)  0.9 %  sodium chloride infusion (has no administration in time range)  acetaminophen (TYLENOL) tablet 650 mg (has no administration in time range)    Or  acetaminophen (TYLENOL) suppository 650 mg (has no administration in time range)  traZODone (DESYREL) tablet 25 mg (has no administration in time range)  magnesium hydroxide (MILK OF MAGNESIA) suspension 30 mL (has no administration in time range)  ondansetron (ZOFRAN) tablet 4 mg (has no administration in time range)    Or  ondansetron (ZOFRAN) injection 4 mg (has no administration in time range)  azithromycin (ZITHROMAX) 500 mg in sodium chloride 0.9 % 250 mL IVPB (has no administration in time range)  methylPREDNISolone sodium succinate (SOLU-MEDROL) 125 mg/2 mL injection 60 mg (has no administration in time range)  sodium chloride 0.9 % bolus 1,000 mL (0 mLs Intravenous Stopped 09/17/18 0439)    And  sodium chloride 0.9 % bolus 1,000 mL (0 mLs Intravenous Stopped 09/17/18 0439)  ceFEPIme (MAXIPIME) 2 g in sodium chloride 0.9 % 100 mL IVPB (0 g Intravenous Stopped 09/17/18 0230)  metroNIDAZOLE (FLAGYL) IVPB 500 mg (0 mg Intravenous Stopped 09/17/18 0307)  vancomycin (VANCOCIN) 1,250 mg in sodium chloride 0.9 % 250 mL IVPB (0 mg Intravenous Stopped 09/17/18 0440)  acetaminophen (TYLENOL) suppository 975 mg (975 mg Rectal Given 09/17/18 0207)  ipratropium-albuterol (DUONEB) 0.5-2.5 (3) MG/3ML nebulizer solution 9 mL (9 mLs Nebulization Given by Other 09/17/18 0441)  methylPREDNISolone sodium succinate (SOLU-MEDROL) 125 mg/2 mL injection 125 mg (125 mg Intravenous Given 09/17/18 0650)    Mobility unsure Low fall risk   Focused Assessments Pulmonary Assessment Handoff:  Lung sounds: Bilateral Breath Sounds: Diminished, Rhonchi O2 Device: Room Air  R Recommendations: See Admitting Provider Note  Report given to:   Additional Notes:

## 2018-09-17 NOTE — Consult Note (Signed)
Pharmacy Antibiotic Note  Michelle [name] is a 58 y.o. female admitted on 09/17/2018 with sepsis.  Pharmacy has been consulted for Cefepime and Vancomycin dosing.   Plan: Vancomycin 1250mg  IV x 1 dose, followed by Vancomycin 1000 mg IV Q 24 hrs. Goal AUC 400-550. Expected AUC: 512 SCr used: 0.8  Cefepime 2g IV every 12 hours   Height: 4\' 9"  (144.8 cm) Weight: 137 lb (62.1 kg) IBW/kg (Calculated) : 38.6  Temp (24hrs), Avg:102 F (38.9 C), Min:100.6 F (38.1 C), Max:103.4 F (39.7 C)  Recent Labs  Lab 09/17/18 0120 09/17/18 0402  WBC 12.4*  --   CREATININE 0.64  --   LATICACIDVEN 2.9* 1.2    Estimated Creatinine Clearance: 58.8 mL/min (by C-G formula based on SCr of 0.64 mg/dL).    No Active Allergies  Antimicrobials this admission: 4/15 cefepime>>  4/15 vancomycin  >>   Dose adjustments this admission:   Microbiology results: 4/15 BCx: pending 4/15 UCx: pending 4/15 MRSA PCR:  4/15 SARS Coronavirus 2: negative   Thank you for allowing pharmacy to be a part of this patient's care.  Pernell Dupre, PharmD, BCPS Clinical Pharmacist 09/17/2018 6:21 AM

## 2018-09-17 NOTE — Consult Note (Signed)
PCCM CONSULT NOTE  INITIAL PRESENTATION: 58 y.o. F with chronic ventilator dependence due to severe COPD presented to [facility] ED with increased shortness of breath and fever. CXR revealed LLL opacity  MAJOR EVENTS/TEST RESULTS: 04/15 admission as documented above.  Admitted to ICU/SDU.  Tracheostomy tube changed out.  Placed on full ventilator support (transitioned off of home ventilator)  INDWELLING DEVICES:: Chronic tracheostomy tube  MICRO DATA: SARS CoV2 04/15 NEG MRSA PCR 4/15 >>  Urine 4/15 >>  Resp 4/15 >>  Blood 4/15 >>   ANTIMICROBIALS:  Vancomycin 4/15 >>  Azithromycin 4/15 >>  Cefepime 4/15 >>   HPI: As above. Level 5 caveat  Past Medical History:  Diagnosis Date  . Allergy   . Anxiety   . Asthma   . CHF (congestive heart failure) ([place])   . Cocaine abuse ([place])   . COPD (chronic obstructive pulmonary disease) ([place])   . Coronary artery disease   . Emphysema/COPD ([place])   . Hepatitis C   . High cholesterol   . Hypertension   . Pneumothorax   . Tobacco abuse    Social History   Tobacco Use  . Smoking status: Current Every Day Smoker    Packs/day: 0.10    Years: 41.00    Pack years: 4.10    Types: Cigarettes  . Smokeless tobacco: Never Used  Substance Use Topics  . Alcohol use: No    Frequency: Never  . Drug use: Yes    Types: "Crack" cocaine    Comment: Past cocaine use.  Quit 04/2017   Family History  Problem Relation Age of Onset  . Lung cancer Mother   . Asthma Mother   . CVA Father   . CAD Father   . Stroke Father   . Breast cancer Maternal Aunt 67  . COPD Sister       SUBJ:    OBJ:  Vitals:   09/17/18 0954 09/17/18 1000 09/17/18 1100 09/17/18 1200  BP:  118/68 107/61 120/71  Pulse:  84 72 69  Resp:  17 15 14   Temp:    97.9 F (36.6 C)  TempSrc:    Oral  SpO2: 100% 100% 100% 100%  Weight:      Height:        Vent Mode: PRVC FiO2 (%):  [40 %-50 %] 40 % Set Rate:  [14 bmp-16 bmp] 14 bmp Vt Set:  [450 mL-500 mL] 450  mL PEEP:  [5 cmH20] 5 cmH20   Intake/Output Summary (Last 24 hours) at 09/17/2018 1321 Last data filed at 09/17/2018 1226 Gross per 24 hour  Intake 2802.12 ml  Output 1325 ml  Net 1477.12 ml     Gen: Alert, synchronous with ventilator HEENT: NCAT, sclerae white Neck: No JVD noted Lungs: breath sounds markedly diminished without wheezes or other adventitious sounds anteriorly Cardiovascular: RRR, no murmurs Abdomen: Soft, nontender, normal BS Ext: Generalized muscle wasting without clubbing, cyanosis, edema Neuro: CNs intact, no focal deficits Skin: Limited exam, no lesions noted   BMP Latest Ref Rng & Units 09/17/2018 12/30/2017 12/26/2017  Glucose 70 - 99 mg/dL 101(H) 105(H) -  BUN 6 - 20 mg/dL 16 32(H) -  Creatinine 0.44 - 1.00 mg/dL 0.64 0.45 0.40(L)  Sodium 135 - 145 mmol/L 142 142 -  Potassium 3.5 - 5.1 mmol/L 3.7 4.2 -  Chloride 98 - 111 mmol/L 105 105 -  CO2 22 - 32 mmol/L 25 30 -  Calcium 8.9 - 10.3 mg/dL 9.2 10.0 -  CBC Latest Ref Rng & Units 09/17/2018 12/30/2017 12/24/2017  WBC 4.0 - 10.5 K/uL 12.4(H) 4.5 11.3(H)  Hemoglobin 12.0 - 15.0 g/dL 12.5 9.1(L) 9.4(L)  Hematocrit 36.0 - 46.0 % 41.6 28.3(L) 29.1(L)  Platelets 150 - 400 K/uL 239 374 335    Hepatic Function Latest Ref Rng & Units 09/17/2018 12/30/2017 12/20/2017  Total Protein 6.5 - 8.1 g/dL 8.3(H) 7.4 6.9  Albumin 3.5 - 5.0 g/dL 4.1 2.9(L) 2.5(L)  AST 15 - 41 U/L 21 26 27   ALT 0 - 44 U/L 16 15 11   Alk Phosphatase 38 - 126 U/L 116 50 47  Total Bilirubin 0.3 - 1.2 mg/dL 0.5 0.4 0.4  Bilirubin, Direct 0.1 - 0.5 mg/dL - - -    Cardiac Panel (last 3 results) No results for input(s): CKTOTAL, CKMB, TROPONINI, RELINDX in the last 72 hours.  CXR: Vague LLL opacity on background of hyperinflation consistent with emphysema    IMPRESSION/PLAN: Acute on chronic respiratory failure Chronic ventilator dependence Very severe COPD Febrile illness, likely LLL pneumonia   Vent settings established Vent bundle  implemented Daily SBT as indicated Nebulized steroids and bronchodilators Monitor temp, WBC count Micro and abx as above DVT px: Enoxaparin SUP: Enteral famotidine Rest of home regimen is to be continued    Merton Border, MD PCCM service Mobile [phone] Pager [phone] 09/17/2018 1:21 PM

## 2018-09-17 NOTE — ED Notes (Signed)
Patient began to decline while this RN in room. After blood draw for repeat lactic acid, patient stated she was hot and then began to have tachypnea with labored breathing. Patient became verbally unresponsive (only would flutter open her eyes when spoken to). Dr. Beather Arbour notified and in to see patient. Respiratory also at bedside.

## 2018-09-17 NOTE — Progress Notes (Signed)
PHARMACY - PHYSICIAN COMMUNICATION CRITICAL VALUE ALERT - BLOOD CULTURE IDENTIFICATION (BCID)  Michelle [name] is an 58 y.o. female who presented to [facility] on 09/17/2018 with a chief complaint of SOB/fever  Assessment: 1/4 anaerobic gram positive rods (include suspected source if known)  Name of physician (or Provider) Contacted: Simonds  Current antibiotics: Vancomycin + Cefepime + Azithromycin  Changes to prescribed antibiotics recommended:  Continue current antibiotics - likely contaminant  Results for orders placed or performed during the hospital encounter of 09/30/17  Blood Culture ID Panel (Reflexed) (Collected: 10/18/2017 10:51 AM)  Result Value Ref Range   Enterococcus species NOT DETECTED NOT DETECTED   Listeria monocytogenes NOT DETECTED NOT DETECTED   Staphylococcus species DETECTED (A) NOT DETECTED   Staphylococcus aureus (BCID) NOT DETECTED NOT DETECTED   Methicillin resistance DETECTED (A) NOT DETECTED   Streptococcus species NOT DETECTED NOT DETECTED   Streptococcus agalactiae NOT DETECTED NOT DETECTED   Streptococcus pneumoniae NOT DETECTED NOT DETECTED   Streptococcus pyogenes NOT DETECTED NOT DETECTED   Acinetobacter baumannii NOT DETECTED NOT DETECTED   Enterobacteriaceae species NOT DETECTED NOT DETECTED   Enterobacter cloacae complex NOT DETECTED NOT DETECTED   Escherichia coli NOT DETECTED NOT DETECTED   Klebsiella oxytoca NOT DETECTED NOT DETECTED   Klebsiella pneumoniae NOT DETECTED NOT DETECTED   Proteus species NOT DETECTED NOT DETECTED   Serratia marcescens NOT DETECTED NOT DETECTED   Haemophilus influenzae NOT DETECTED NOT DETECTED   Neisseria meningitidis NOT DETECTED NOT DETECTED   Pseudomonas aeruginosa NOT DETECTED NOT DETECTED   Candida albicans NOT DETECTED NOT DETECTED   Candida glabrata NOT DETECTED NOT DETECTED   Candida krusei NOT DETECTED NOT DETECTED   Candida parapsilosis NOT DETECTED NOT DETECTED   Candida tropicalis NOT DETECTED  NOT [place], PharmD Pharmacy Resident  09/17/2018 1:45 PM

## 2018-09-17 NOTE — Progress Notes (Signed)
This RT and another RT attempted to change trach. We were unable to change due to resistance while trying to remove trach. MD notified.

## 2018-09-17 NOTE — Consult Note (Addendum)
Pharmacy Antibiotic Note  Michelle [name] is a 58 y.o. female admitted on 09/17/2018 with sepsis.  Diagnosed with acute on chronic COPD with pneumonia. Pharmacy has been consulted for Cefepime and Vancomycin dosing.   Plan: Vancomycin 1250mg  IV x 1 dose, followed by Vancomycin 750 mg IV Q 24 hrs. Goal AUC 400-550. Expected AUC: 495.3 SCr used: 0.8, Vd used 0.5 (BMI > 30)  Increased Cefepime 2g IV every 8 hours   Height: 4\' 9"  (144.8 cm) Weight: 153 lb (69.4 kg) IBW/kg (Calculated) : 38.6  Temp (24hrs), Avg:100.4 F (38 C), Min:98 F (36.7 C), Max:103.4 F (39.7 C)  Recent Labs  Lab 09/17/18 0120 09/17/18 0402  WBC 12.4*  --   CREATININE 0.64  --   LATICACIDVEN 2.9* 1.2    Estimated Creatinine Clearance: 62.3 mL/min (by C-G formula based on SCr of 0.64 mg/dL).    No Active Allergies  Antimicrobials this admission: 4/15 cefepime>>  4/15 vancomycin  >>  4/15 azithromycin >> 4/15 Flagyl x 1  Dose adjustments this admission: 4/15 Cefepime increased to 2 g IV q8h (from 12 hours) 4/15 Vancomycin decreased to 1000 mg q24h   Microbiology results: 4/15 BCx: NGTD 4/15 UCx: pending 4/15 MRSA PCR: pending 4/15 SARS Coronavirus 2: negative   Thank you for allowing pharmacy to be a part of this patient's care.   Paticia Stack, PharmD Pharmacy Resident  09/17/2018 12:20 PM

## 2018-09-17 NOTE — Progress Notes (Signed)
Abg results called dr.sung.

## 2018-09-18 ENCOUNTER — Inpatient Hospital Stay: Payer: Medicaid Other

## 2018-09-18 ENCOUNTER — Inpatient Hospital Stay: Payer: Self-pay

## 2018-09-18 DIAGNOSIS — J449 Chronic obstructive pulmonary disease, unspecified: Secondary | ICD-10-CM

## 2018-09-18 LAB — INFLUENZA PANEL BY PCR (TYPE A & B)
Influenza A By PCR: NEGATIVE
Influenza B By PCR: NEGATIVE

## 2018-09-18 LAB — CBC
HCT: 39.3 % (ref 36.0–46.0)
Hemoglobin: 11.7 g/dL — ABNORMAL LOW (ref 12.0–15.0)
MCH: 27.1 pg (ref 26.0–34.0)
MCHC: 29.8 g/dL — ABNORMAL LOW (ref 30.0–36.0)
MCV: 91 fL (ref 80.0–100.0)
Platelets: 217 10*3/uL (ref 150–400)
RBC: 4.32 MIL/uL (ref 3.87–5.11)
RDW: 13.9 % (ref 11.5–15.5)
WBC: 14.5 10*3/uL — ABNORMAL HIGH (ref 4.0–10.5)
nRBC: 0 % (ref 0.0–0.2)

## 2018-09-18 LAB — CULTURE, BLOOD (ROUTINE X 2)

## 2018-09-18 LAB — BASIC METABOLIC PANEL
Anion gap: 8 (ref 5–15)
BUN: 19 mg/dL (ref 6–20)
CO2: 24 mmol/L (ref 22–32)
Calcium: 9.2 mg/dL (ref 8.9–10.3)
Chloride: 111 mmol/L (ref 98–111)
Creatinine, Ser: 0.67 mg/dL (ref 0.44–1.00)
GFR calc Af Amer: >60 mL/min (ref >60)
GFR calc non Af Amer: >60 mL/min (ref >60)
Glucose, Bld: 130 mg/dL — ABNORMAL HIGH (ref 70–99)
Potassium: 4.3 mmol/L (ref 3.5–5.1)
Sodium: 143 mmol/L (ref 135–145)

## 2018-09-18 LAB — URINE CULTURE
Culture: NO GROWTH
Special Requests: NORMAL

## 2018-09-18 LAB — STREP PNEUMONIAE URINARY ANTIGEN: Strep Pneumo Urinary Antigen: NEGATIVE

## 2018-09-18 LAB — PROCALCITONIN: Procalcitonin: 1.97 ng/mL

## 2018-09-18 MED ORDER — FREE WATER
200.0000 mL | Freq: Three times a day (TID) | Status: DC
  Administered 2018-09-18 – 2018-09-19 (×5): 200 mL
  Administered 2018-09-20: 100 mL
  Administered 2018-09-20: 14:00:00 200 mL

## 2018-09-18 MED ORDER — ENSURE ENLIVE PO LIQD
237.0000 mL | Freq: Four times a day (QID) | ORAL | Status: DC
  Administered 2018-09-18 – 2018-09-20 (×8): 237 mL

## 2018-09-18 MED ORDER — ENSURE ENLIVE PO LIQD
237.0000 mL | Freq: Four times a day (QID) | ORAL | Status: DC
  Administered 2018-09-18: 237 mL via ORAL

## 2018-09-18 MED ORDER — MORPHINE SULFATE (PF) 2 MG/ML IV SOLN
1.0000 mg | INTRAVENOUS | Status: DC | PRN
  Administered 2018-09-18: 2 mg via INTRAVENOUS
  Filled 2018-09-18: qty 1

## 2018-09-18 NOTE — Progress Notes (Signed)
Initial Nutrition Assessment  RD working remotely.  DOCUMENTATION CODES:   Obesity unspecified  INTERVENTION:  Will monitor outcome of SLP evaluation and make appropriate recommendations for nutrition interventions.  NUTRITION DIAGNOSIS:   Inadequate oral intake related to inability to eat as evidenced by NPO status.  GOAL:   Patient will meet greater than or equal to 90% of their needs  MONITOR:   Diet advancement, Labs, Weight trends, I & O's  REASON FOR ASSESSMENT:   Ventilator, Malnutrition Screening Tool    ASSESSMENT:   58 year old female with PMHx of emphysema, COPD, HTN, CAD, hx cocaine abuse, asthma, CHF, anxiety, hepatitis C, hx tracheostomy placement 10/17/2017 who is ventilator-dependent, and hx of G-tube placement 10/31/2017 who is admitted from home with acute on chronic respiratory failure, very severe COPD, LLL pseudomonas PNA.   -Patient being ruled out for COVID-19.   Patient well-known to this RD from multiple previous admissions. Per chart patient able to communicate by writing. Since RD working remotely unable to obtain nutrition/weight history from patient at this time. At time of discharge in 01/2018 patient was on a continuous tube feeding regimen per G-tube and tolerating well. It appears per review of notes they had issues with keeping tubing "attached" to patient at home so a home care RN requested at one point to transition patient to a bolus regimen. Unsure if that ever happened. From a recent palliative note it appears patient is no longer taking her tube feeding at all and has started "eating by mouth." There is no mention that this was following a SLP evaluation or that patient is being followed by SLP at home.   Noted patient has now been switched over to her home vent. She is now on Assist-Control mode with FiO2 32% and PEEP 5 cmH2O. This mode will be better for possible SLP evaluation as patient was previously on PRVC mode this AM. Pending inpatient  SLP evaluation before patient is able to take PO.  Per weight history in chart patient has gained about 20 kg since 12/2017. Unable to determine body composition without completing NFPE but there is no edema documented by RN.  Enteral Access: pt does still have G-tube in place  Medications reviewed and include: clonazepam, famotidine, free water flush 200 mL Q8hrs per tube, Remeron, Seroquel, cefepime.  Labs reviewed.  Discussed with RN and SLP.  NUTRITION - FOCUSED PHYSICAL EXAM:  Unable to complete at this time.  Diet Order:   Diet Order    None     EDUCATION NEEDS:   No education needs have been identified at this time  Skin:  Skin Assessment: Reviewed RN Assessment  Last BM:  Unknown  Height:   Ht Readings from Last 1 Encounters:  09/17/18 4\' 9"  (1.448 m)   Weight:   Wt Readings from Last 1 Encounters:  09/17/18 69.4 kg   Ideal Body Weight:     BMI:  Body mass index is 33.11 kg/m.  Estimated Nutritional Needs:   Kcal:  1400-1600  Protein:  70-80 grams  Fluid:  1.4-1.6 L/day  Willey Blade, MS, RD, LDN Office: [phone] Pager: [phone] After Hours/Weekend Pager: [phone]

## 2018-09-18 NOTE — Progress Notes (Signed)
Michelle Edwards, RN and Dr. Leonidas Romberg updated that patient requests PICC to be done tomorrow due to the fact that she is "too tired".  She is aware that she needs the PICC for longterm medications.  Carolee Rota, RN VAST

## 2018-09-18 NOTE — Progress Notes (Signed)
[name] at [place] NAME: Michelle [name]    MR#:  [id number]  DATE OF BIRTH:  09-16-1960  SUBJECTIVE:  CHIEF COMPLAINT:   Chief Complaint  Patient presents with  . Shortness of Breath  . Fever   Patient is chronically on ventilatory support to track.  Came with pneumonia.  On 40% oxygen supplementation with ventilator currently.  Fully alert and oriented.  Cannot talk due to track but able to answer by lip reading and writing on the paper.  REVIEW OF SYSTEMS:  CONSTITUTIONAL: No fever, fatigue or weakness.  EYES: No blurred or double vision.  EARS, NOSE, AND THROAT: No tinnitus or ear pain.  RESPIRATORY: No cough, shortness of breath, wheezing or hemoptysis.  CARDIOVASCULAR: No chest pain, orthopnea, edema.  GASTROINTESTINAL: No nausea, vomiting, diarrhea or abdominal pain.  GENITOURINARY: No dysuria, hematuria.  ENDOCRINE: No polyuria, nocturia,  HEMATOLOGY: No anemia, easy bruising or bleeding SKIN: No rash or lesion. MUSCULOSKELETAL: No joint pain or arthritis.   NEUROLOGIC: No tingling, numbness, weakness.  PSYCHIATRY: No anxiety or depression.   ROS  DRUG ALLERGIES:  No Active Allergies  VITALS:  Blood pressure 111/63, pulse 74, temperature 98.2 F (36.8 C), temperature source Oral, resp. rate 14, height 4\' 9"  (1.448 m), weight 69.4 kg, last menstrual period 03/06/2005, SpO2 97 %.  PHYSICAL EXAMINATION:  GENERAL:  58 y.o.-year-old patient lying in the bed with no acute distress.  EYES: Pupils equal, round, reactive to light and accommodation. No scleral icterus. Extraocular muscles intact.  HEENT: Head atraumatic, normocephalic. Oropharynx and nasopharynx clear.  Tracheostomy in place and ventilatory support through that. NECK:  Supple, no jugular venous distention. No thyroid enlargement, no tenderness.  LUNGS: Normal breath sounds bilaterally, no wheezing, rales,rhonchi or crepitation. No use of accessory muscles of  respiration.  Ventilatory support through tracheostomy. CARDIOVASCULAR: S1, S2 normal. No murmurs, rubs, or gallops.  ABDOMEN: Soft, nontender, nondistended. Bowel sounds present. No organomegaly or mass.  EXTREMITIES: No pedal edema, cyanosis, or clubbing.  NEUROLOGIC: Cranial nerves II through XII are intact. Muscle strength 4/5 in all extremities. Sensation intact. Gait not checked.  PSYCHIATRIC: The patient is alert and oriented x 3.  SKIN: No obvious rash, lesion, or ulcer.   Physical Exam LABORATORY PANEL:   CBC Recent Labs  Lab 09/18/18 0318  WBC 14.5*  HGB 11.7*  HCT 39.3  PLT 217   ------------------------------------------------------------------------------------------------------------------  Chemistries  Recent Labs  Lab 09/17/18 0120 09/18/18 0318  NA 142 143  K 3.7 4.3  CL 105 111  CO2 25 24  GLUCOSE 101* 130*  BUN 16 19  CREATININE 0.64 0.67  CALCIUM 9.2 9.2  AST 21  --   ALT 16  --   ALKPHOS 116  --   BILITOT 0.5  --    ------------------------------------------------------------------------------------------------------------------  Cardiac Enzymes No results for input(s): TROPONINI in the last 168 hours. ------------------------------------------------------------------------------------------------------------------  RADIOLOGY:  Dg Chest Port 1 View  Result Date: 09/18/2018 CLINICAL DATA:  Respiratory failure. EXAM: PORTABLE CHEST 1 VIEW COMPARISON:  One-view chest x-ray 09/17/2018 FINDINGS: Heart size is normal. Tracheostomy tube is stable. Progressive left lower lobe consolidation is present. The right lung is clear. The visualized soft tissues and bony thorax are unremarkable. IMPRESSION: 1. Progressive left lower lobe airspace consolidation concerning for pneumonia or aspiration. Electronically Signed   By: San Morelle M.D.   On: 09/18/2018 06:30   Dg Chest Port 1 View  Result Date: 09/17/2018 CLINICAL DATA:  Progressive  shortness  of breath. EXAM: PORTABLE CHEST 1 VIEW COMPARISON:  One-view chest x-ray 1:19 a.m. FINDINGS: The heart size is normal. Prominence the pulmonary arteries is again noted. Tracheostomy tube is stable. Left lower lobe airspace disease is similar the prior exam. Changes of COPD are again noted. IMPRESSION: 1. Stable left lower lobe airspace disease concerning for infection. Electronically Signed   By: San Morelle M.D.   On: 09/17/2018 05:06   Dg Chest Port 1 View  Result Date: 09/17/2018 CLINICAL DATA:  58 y/o  F; shortness of breath. EXAM: PORTABLE CHEST 1 VIEW COMPARISON:  12/30/2017 chest radiograph FINDINGS: Stable position of tracheostomy tube with tip projecting over the tracheal air column. Hyperinflated lungs and chronic bronchitic changes. Stable cardiac silhouette within normal limits given projection and technique. Left basilar streaky opacities. No pleural effusion or pneumothorax. No acute osseous abnormality is evident. IMPRESSION: Stable findings of COPD. Left basilar streaky opacities may represent atelectasis or developing infiltrate. Electronically Signed   By: Kristine Garbe M.D.   On: 09/17/2018 02:06   [place] Ekg Site Rite  Result Date: 09/18/2018 If Site Rite image not attached, placement could not be confirmed due to current cardiac rhythm.   ASSESSMENT AND PLAN:   Active Problems:   Pneumonia   #1.  Acute on top of chronic hypoxic and hypercarbic respiratory failure secondary to left lower lobe pneumonia.   maintained on mechanical ventilation and admitted to an ICU bed.  She will be continued on IV cefepime, Zithromax and vancomycin.  Negative influenza antigens as well as Legionella and pneumonia antigens.  Scheduled and PRN duo nebs will be provided.  Mucolytic therapy will be provided.  O2 protocol will be followed.  As per intensivist, due to Pseudomonas pneumonia patient will need IV antibiotics.  Waiting for culture and sensitivity result.  2.  Left  lower lobe pneumonia likely bacterial possibly gram-negative with subsequent sepsis without severe sepsis or septic shock.  Management as above.  Blood cultures and tracheal sputum Gram stain, culture and sensitivity will be followed.  The patient's COVID-19 test came back negative.  3.  Acute asthma/COPD exacerbation.   on scheduled and PRN duo nebs as well as scheduled IV Solu-Medrol.  We will hold off her Symbicort and continue her Pulmicort.  We will continue her Spiriva.  4.  Hypertension.  Coreg and Vasotec will be resumed.  5..  Anxiety/depression.  We will continue her Klonopin, Paxil and Seroquel.  6.  Dyslipidemia.  Zocor will be resumed.  7.  DVT prophylaxis.  Subcutaneous Lovenox and GI prophylaxis with continued H2 blocker therapy especially given current steroid therapy.    All the records are reviewed and case discussed with Care Management/Social Workerr. Management plans discussed with the patient, family and they are in agreement.  CODE STATUS: Full.  TOTAL TIME TAKING CARE OF THIS PATIENT: 35 minutes.     POSSIBLE D/C IN 1-2 DAYS, DEPENDING ON CLINICAL CONDITION.   [name] M.D on 09/18/2018   Between 7am to 6pm - Pager - [phone]  After 6pm go to [email or web address] - password [email or web address] [facility]  Office  [phone]  CC: Primary care physician; Frazier Richards, MD  Note: This dictation was prepared with Dragon dictation along with smaller phrase technology. Any transcriptional errors that result from this process are unintentional.

## 2018-09-18 NOTE — Progress Notes (Signed)
PCCM PROGRESS NOTE  INITIAL PRESENTATION: 58 y.o. 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 CXR revealed LLL opacity  MAJOR EVENTS/TEST RESULTS: 04/15 admission as documented above.  Admitted to ICU/SDU.  Tracheostomy tube changed out.  Placed on full ventilator support (transitioned off of home ventilator)  INDWELLING DEVICES:: Chronic tracheostomy tube  MICRO DATA: SARS CoV2 04/15 NEG MRSA PCR 4/15 >> NEG Urine 4/15 >> NEG Resp 4/15 >> pseudomonas Blood 4/15 >> 1/2 GPR (likely contaminant)  ANTIMICROBIALS:  Vancomycin 4/15 >> 04/16 Azithromycin 4/15 >> 04/16 Cefepime 4/15 >>    SUBJ: Awake, alert, + F/C. NAD. Synchronous   OBJ:  Vitals:   09/18/18 0741 09/18/18 0800 09/18/18 0906 09/18/18 1050  BP: 100/82 119/77 120/77   Pulse: 73 70    Resp:  14    Temp:  (!) 97.5 F (36.4 C)    TempSrc:  Oral    SpO2:  100%  99%  Weight:      Height:        Vent Mode: AC FiO2 (%):  [32 %-40 %] 32 % Set Rate:  [14 bmp-16 bmp] 16 bmp Vt Set:  [450 mL] 450 mL PEEP:  [5 cmH20] 5 cmH20 Plateau Pressure:  [15 cmH20-20 cmH20] 16 cmH20   Intake/Output Summary (Last 24 hours) at 09/18/2018 1150 Last data filed at 09/18/2018 0800 Gross per 24 hour  Intake 521.2 ml  Output 1645 ml  Net -1123.8 ml     Gen: NAD HEENT: NCAT, sclerae white Neck: No JVD noted Lungs: breath sounds markedly diminished without wheezes Cardiovascular: RRR, no murmurs Abdomen: Soft, nontender, normal BS Ext: Generalized muscle wasting without clubbing, cyanosis, edema Neuro: CNs intact, no focal deficits Skin: Limited exam, no lesions noted   BMP Latest Ref Rng & Units 09/18/2018 09/17/2018 12/30/2017  Glucose 70 - 99 mg/dL 130(H) 101(H) 105(H)  BUN 6 - 20 mg/dL 19 16 32(H)  Creatinine 0.44 - 1.00 mg/dL 0.67 0.64 0.45  Sodium 135 - 145 mmol/L 143 142 142  Potassium 3.5 - 5.1 mmol/L 4.3 3.7 4.2  Chloride 98 - 111  mmol/L 111 105 105  CO2 22 - 32 mmol/L 24 25 30   Calcium 8.9 - 10.3 mg/dL 9.2 9.2 10.0    CBC Latest Ref Rng & Units 09/18/2018 09/17/2018 12/30/2017  WBC 4.0 - 10.5 K/uL 14.5(H) 12.4(H) 4.5  Hemoglobin 12.0 - 15.0 g/dL 11.7(L) 12.5 9.1(L)  Hematocrit 36.0 - 46.0 % 39.3 41.6 28.3(L)  Platelets 150 - 400 K/uL 217 239 374    Hepatic Function Latest Ref Rng & Units 09/17/2018 12/30/2017 12/20/2017  Total Protein 6.5 - 8.1 g/dL 8.3(H) 7.4 6.9  Albumin 3.5 - 5.0 g/dL 4.1 2.9(L) 2.5(L)  AST 15 - 41 U/L 21 26 27   ALT 0 - 44 U/L 16 15 11   Alk Phosphatase 38 - 126 U/L 116 50 47  Total Bilirubin 0.3 - 1.2 mg/dL 0.5 0.4 0.4  Bilirubin, Direct 0.1 - 0.5 mg/dL - - -    Cardiac Panel (last 3 results) No results for input(s): CKTOTAL, CKMB, TROPONINI, RELINDX in the last 72 hours.  CXR: Progressive left lower lobe airspace consolidation     IMPRESSION/PLAN: Acute on chronic respiratory failure Chronic ventilator dependence Very severe COPD LLL pseudomonas PNA   Cont full vent support - settings reviewed and/or adjusted Cont vent bundle Daily SBT if/when meets criteria  Will try back on home vent (Trilogy) today Cont nebulized  steroids and bronchodilators Cont to monitor temp, WBC count Micro and abx as above DVT px: Enoxaparin SUP: Enteral famotidine PICC ordered  If she tolerates Trilogy and gets PICC placed, she may be discharged to home with IV abx... Ned to get sensitivities on resp culture    Merton Border, MD PCCM service Mobile [phone] Pager [phone] 09/18/2018 11:50 AM

## 2018-09-18 NOTE — TOC Progression Note (Signed)
Transition of Care [facility]) - Progression Note    Patient Details  Name: Michelle [name] MRN: [id number] Date of Birth: [birth year]  Transition of Care [facility] CM/SW Contact  Beverly Sessions, RN Phone Number: 09/18/2018, 1:48 PM  Clinical Narrative:    RNCM spoke with daughter [name] Via phone.  Patient lives at home with her daughter [name], and her husband.  She receives Medicaid PCS services through [employer] (5 days a week, 120 hours per week).  [name] states that have not been pleased with the services they have received.  RNCM encouraged her to reach out to [employer] and Medicaid directly to express her concerns.   PCP [place] at [facility].  Utilizes pharmacy at [facility]. Denies issues obtaining medications. Family transports her via private vehicle to appointments  Patient has hospital bed, RW, bsc, home vent, and O2 through [employer] [facility].    Expected Discharge Plan: [place] Barriers to Discharge: Continued Medical Work up  Expected Discharge Plan and Services Expected Discharge Plan: [place]   Discharge Planning Services: CM Consult   Living arrangements for the past 2 months: Single Family Home                           Social Determinants of Health (SDOH) Interventions    Readmission Risk Interventions No flowsheet data found.

## 2018-09-18 NOTE — Consult Note (Addendum)
Pharmacy Antibiotic Note  Michelle [name] is a 57 y.o. female admitted on 09/17/2018 with sepsis.  Diagnosed with acute on chronic COPD with pneumonia. Patient is currently intubated - is on chronic ventilator at home, also. Patient also has chronic trach. Pharmacy has been consulted for Cefepime and Vancomycin dosing.   Plan: Continue Cefepime 2g IV every 8 hours. D2 of antibiotic therapy.   Height: 4\' 9"  (144.8 cm) Weight: 153 lb (69.4 kg) IBW/kg (Calculated) : 38.6  Temp (24hrs), Avg:97.7 F (36.5 C), Min:97.5 F (36.4 C), Max:97.9 F (36.6 C)  Recent Labs  Lab 09/17/18 0120 09/17/18 0402 09/18/18 0318  WBC 12.4*  --  14.5*  CREATININE 0.64  --  0.67  LATICACIDVEN 2.9* 1.2  --     Estimated Creatinine Clearance: 62.3 mL/min (by C-G formula based on SCr of 0.67 mg/dL).    No Active Allergies  Antimicrobials this admission: 4/15 cefepime>>  4/15 vancomycin  >> 4/16 4/15 azithromycin >> 4/16 4/15 Flagyl x 1  Dose adjustments this admission: 4/15 Cefepime increased to 2 g IV q8h (from 12 hours) 4/15 Vancomycin decreased to 1000 mg q24h   Microbiology results: 4/15 BCx: Bacillus species, anaerobic bottle only 4/15 Resp Cx Pseudomonas 4/15 UCx: NG 4/15 MRSA PCR: pending 4/15 SARS Coronavirus 2: negative   Thank you for allowing pharmacy to be a part of this patient's care.   Paticia Stack, PharmD Pharmacy Resident  09/18/2018 10:44 AM

## 2018-09-18 NOTE — Progress Notes (Signed)
Pastoral Care visit   09/18/18 1000  Clinical Encounter Type  Visited With Patient  Visit Type Other (Comment) (HCPOA)  Referral From Nurse  Consult/Referral To Chaplain  Spiritual Encounters  Spiritual Needs Prayer;Literature  Stress Factors  Patient Stress Factors Not reviewed   Pt requested info regarding HCPOA.  Due to covid 19 restrictions unable to complete ppwk at this time but was able to educate pt about POA and Living Will. Pt received ppwk and will take home to share with her loved one.  Pt requested prayer.  Chap prayed w/ pt.  Darcey Nora, Chaplain

## 2018-09-18 NOTE — Progress Notes (Signed)
Seen in ICU, on full vent support via trach, alert and oriented. Manage per ICU team for PNA.

## 2018-09-18 NOTE — Progress Notes (Signed)
Chaplain received OR for AD, went to visit patient to educate and patient was not fully awake. Chaplain was told by nurse that patient could communicate with Chaplain through writing on clip board. However, patient seemed to be sleepy. Chaplain will pass OR to the Chaplain on call to revisit.

## 2018-09-18 NOTE — Progress Notes (Signed)
Pt placed on home vent per MD order. Pt tol well AC 16/45/5/32%  HR 67  RR 16  Sat 99%. Will continue to monitor closely

## 2018-09-19 DIAGNOSIS — J151 Pneumonia due to Pseudomonas: Secondary | ICD-10-CM

## 2018-09-19 DIAGNOSIS — J9621 Acute and chronic respiratory failure with hypoxia: Secondary | ICD-10-CM

## 2018-09-19 DIAGNOSIS — Z9911 Dependence on respirator [ventilator] status: Secondary | ICD-10-CM

## 2018-09-19 DIAGNOSIS — J9622 Acute and chronic respiratory failure with hypercapnia: Secondary | ICD-10-CM

## 2018-09-19 LAB — CBC
HCT: 32.7 % — ABNORMAL LOW (ref 36.0–46.0)
Hemoglobin: 10 g/dL — ABNORMAL LOW (ref 12.0–15.0)
MCH: 26.8 pg (ref 26.0–34.0)
MCHC: 30.6 g/dL (ref 30.0–36.0)
MCV: 87.7 fL (ref 80.0–100.0)
Platelets: 203 10*3/uL (ref 150–400)
RBC: 3.73 MIL/uL — ABNORMAL LOW (ref 3.87–5.11)
RDW: 13.9 % (ref 11.5–15.5)
WBC: 8.4 10*3/uL (ref 4.0–10.5)
nRBC: 0 % (ref 0.0–0.2)

## 2018-09-19 LAB — BASIC METABOLIC PANEL
Anion gap: 6 (ref 5–15)
BUN: 27 mg/dL — ABNORMAL HIGH (ref 6–20)
CO2: 24 mmol/L (ref 22–32)
Calcium: 9 mg/dL (ref 8.9–10.3)
Chloride: 111 mmol/L (ref 98–111)
Creatinine, Ser: 0.68 mg/dL (ref 0.44–1.00)
GFR calc Af Amer: >60 mL/min (ref >60)
GFR calc non Af Amer: >60 mL/min (ref >60)
Glucose, Bld: 106 mg/dL — ABNORMAL HIGH (ref 70–99)
Potassium: 3.6 mmol/L (ref 3.5–5.1)
Sodium: 141 mmol/L (ref 135–145)

## 2018-09-19 LAB — CULTURE, RESPIRATORY W GRAM STAIN

## 2018-09-19 LAB — PROCALCITONIN: Procalcitonin: 1.9 ng/mL

## 2018-09-19 LAB — HIV ANTIBODY (ROUTINE TESTING W REFLEX): HIV Screen 4th Generation wRfx: NONREACTIVE

## 2018-09-19 MED ORDER — SODIUM CHLORIDE 0.9% FLUSH: 10.0000 mL | INTRAVENOUS | Status: DC | PRN

## 2018-09-19 MED ORDER — SODIUM CHLORIDE 0.9 % IV SOLN: 2.0000 g | Freq: Three times a day (TID) | INTRAVENOUS | Status: DC

## 2018-09-19 MED ORDER — SODIUM CHLORIDE 0.9% FLUSH
10.0000 mL | Freq: Two times a day (BID) | INTRAVENOUS | Status: DC
  Administered 2018-09-19 – 2018-09-20 (×3): 10 mL

## 2018-09-19 NOTE — Discharge Summary (Signed)
Physician Discharge Summary  Patient ID: Michelle [name] MRN: [id number] DOB/AGE: May 06, 1961 58 y.o.  Admit date: 09/17/2018 Discharge date: 09/19/2018                                                          DISCHARGE SUMMARY   Michelle [name] is a 58 y.o. female with a PMH of Asthma, Severe COPD, Chronic respiratory failure s/p tracheostomy on chronic ventilator at home, Panic attacks, Anxiety, Chronic diastolic CHF, Hyperlipidemia, CAD, HTN, Pneumothorax, Former tobacco abuse, Hepatitis C, and Cocaine abuse.  She presented to [facility] ER on 09/17/2018 with worsening shortness of breath and fever.  Pt COVID-19 negative and influenza pcr negative, however CXR revealed left lower lobe pneumonia.  She received iv vancomycin and cefepime. She was subsequently admitted to ICU for additional workup and treatment.  Upon arrival to ICU on 09/17/2018 pts tracheostomy changed to cuffed size 6 shiley, and pt transitioned off her home ventilator to [facility] ventilator for full support.  Respiratory culture positive for pseudomonas aeruginosa and sensitive to iv cefepime.  PICC line placed 09/18/2018 prior to discharge for home administration of iv cefepime. Pt transitioned to home ventilator (trilogy) on 09/18/2018 and tolerated well.    SIGNIFICANT EVENTS 04/15-Pt admitted to ICU with acute on chronic hypoxic respiratory failure secondary to LLL pneumonia   MICRO DATA  SARS CoV2 4/15>>NEG MRSA PCR 4/15 >>NEG Urine 4/15 >>NEG  Resp 4/15 >>Pseudomonas aeruginosa  Blood 4/15 >>NEG   ANTIBIOTICS Vancomycin 09/17/2018 Metronidazole x1 dose 09/17/2018 Azithromycin 09/17/2018 Cefepime 04/15>>  CONSULTS Speech   TUBES / LINES 6 shiley midline tracheostomy (changed 42/70/6237)  Right basilic single lumen PICC (placed 09/18/2018)  Discharge Exam: General: well developed, well nourished female, resting in bed, in NAD. Neuro: alert, follows commands, PERRLA  HEENT: [place]/AT. PERRL, sclerae anicteric. Cardiovascular:  RRR, no M/R/G.  Lungs: diminished throughout, even and non labored, chronic ventilatory support via chronic size 6 tracheostomy   Abdomen: BS x 4, soft, NT/ND.  Musculoskeletal: No gross deformities, trace bilateral edema.  Skin: Intact, warm, no rashes.   Vitals:   09/19/18 1200 09/19/18 1300 09/19/18 1400 09/19/18 1500  BP: 110/60 102/66 120/69 (!) 155/79  Pulse: 64 62 65 95  Resp: 16 16 16 17   Temp:   98.4 F (36.9 C)   TempSrc:   Oral   SpO2: 96% 98% 98% 99%  Weight:      Height:         Discharge Labs  BMET Recent Labs  Lab 09/17/18 0120 09/18/18 0318 09/19/18 0628  NA 142 143 141  K 3.7 4.3 3.6  CL 105 111 111  CO2 25 24 24   GLUCOSE 101* 130* 106*  BUN 16 19 27*  CREATININE 0.64 0.67 0.68  CALCIUM 9.2 9.2 9.0    CBC Recent Labs  Lab 09/17/18 0120 09/18/18 0318 09/19/18 0628  HGB 12.5 11.7* 10.0*  HCT 41.6 39.3 32.7*  WBC 12.4* 14.5* 8.4  PLT 239 217 203    Anti-Coagulation No results for input(s): INR in the last 168 hours.   DISCHARGE DIAGNOSIS:    Acute on chronic hypoxic respiratory failure secondary to left lower lobe pseudomonas pneumonia  Ventilator dependent  Severe COPD/Emphysema Asthma  Continue pulmicort and duonebs Continue home trilogy-home private duty nursing for ventilator tracheostomy management  Continue iv cefepime via right upper arm PICC-stop date 09/23/2018   Hypertension  Hypercholesteremia  CAD  Resume enalapril, carvedilol, aspirin, and simvastatin   GI/Nutrition  Pt to follow-up with her primary team at [facility] for repeat assessment of swallowing for any new recommendations re: oral diet; initiating an oral diet eating/drinking on vent.  Resume bolus feeds via PEG tube   Depression Panic attacks  Resume trazodone, seroquel, clonazepam, and remeron     Allergies as of 09/19/2018   No Active Allergies     Medication List    STOP taking these medications   doxycycline 100 MG tablet Commonly known as:   VIBRA-TABS     TAKE these medications   AMBULATORY NON FORMULARY MEDICATION Medication Name: Pulse oximeter DME:[facility] DX J44.9....J96.21   aspirin 81 MG chewable tablet Chew 81 mg by mouth daily.   budesonide 0.25 MG/2ML nebulizer solution Commonly known as:  PULMICORT Take 2 mLs (0.25 mg total) by nebulization 2 (two) times daily.   carvedilol 3.125 MG tablet Commonly known as:  COREG Take 3.125 mg by mouth 2 (two) times daily with a meal.   ceFEPIme 2 g in sodium chloride 0.9 % 100 mL Inject 2 g into the vein every 8 (eight) hours.   cetirizine 10 MG tablet Commonly known as:  ZYRTEC Take 10 mg by mouth daily.   clonazePAM 0.5 MG tablet Commonly known as:  KLONOPIN Place 1 tablet (0.5 mg total) into feeding tube daily. What changed:    how much to take  how to take this  when to take this  additional instructions   enalapril 5 MG tablet Commonly known as:  VASOTEC Take 5 mg by mouth daily.   famotidine 20 MG tablet Commonly known as:  PEPCID Place 1 tablet (20 mg total) into feeding tube daily.   fentaNYL 25 MCG/HR Commonly known as:  DURAGESIC Place 1 patch (25 mcg total) onto the skin every 3 (three) days.   fluticasone 50 MCG/ACT nasal spray Commonly known as:  FLONASE Place 2 sprays into both nostrils daily.   ipratropium-albuterol 0.5-2.5 (3) MG/3ML Soln Commonly known as:  DUONEB Take 3 mLs by nebulization every 6 (six) hours.   levalbuterol 1.25 MG/3ML nebulizer solution Commonly known as:  XOPENEX Take 1.25 mg by nebulization every 4 (four) hours as needed for wheezing.   mirtazapine 45 MG tablet Commonly known as:  REMERON Take 45 mg by mouth at bedtime.   oxyCODONE-acetaminophen 5-325 MG tablet Commonly known as:  PERCOCET/ROXICET Place 1 tablet into feeding tube every 8 (eight) hours as needed for severe pain.   PARoxetine 20 MG tablet Commonly known as:  PAXIL Place 1 tablet (20 mg total) into feeding tube at bedtime.    QUEtiapine 25 MG tablet Commonly known as:  SEROQUEL Place 1 tablet (25 mg total) into feeding tube at bedtime.   senna 8.6 MG Tabs tablet Commonly known as:  SENOKOT Take 1 tablet by mouth daily as needed for mild constipation.   simvastatin 20 MG tablet Commonly known as:  ZOCOR Take 40 mg by mouth daily at 6 PM.        Disposition: Home with home health services   Marda Stalker, [place] Pager [phone] (please enter 7 digits) PCCM Consult Pager [phone] (please enter 7 digits)

## 2018-09-19 NOTE — Progress Notes (Signed)
Patient has refused trach care this morning, stating that at home it is only done once a day and not every 12 hours.  Will continue to monitor.   Cameron Ali, RN

## 2018-09-19 NOTE — Progress Notes (Signed)
[facility] [place] NAME: Michelle [name]    MR#:  [id number]  DATE OF BIRTH:  12/09/1960  SUBJECTIVE:  CHIEF COMPLAINT:   Chief Complaint  Patient presents with  . Shortness of Breath  . Fever   Patient is chronically on ventilatory support to track.  Came with pneumonia.  On 40% oxygen supplementation with ventilator currently.  Fully alert and oriented.  Cannot talk due to track but able to answer by lip reading and writing on the paper.  She was today back on her trilogy ventilator same at home.  REVIEW OF SYSTEMS:  CONSTITUTIONAL: No fever, fatigue or weakness.  EYES: No blurred or double vision.  EARS, NOSE, AND THROAT: No tinnitus or ear pain.  RESPIRATORY: No cough, shortness of breath, wheezing or hemoptysis.  CARDIOVASCULAR: No chest pain, orthopnea, edema.  GASTROINTESTINAL: No nausea, vomiting, diarrhea or abdominal pain.  GENITOURINARY: No dysuria, hematuria.  ENDOCRINE: No polyuria, nocturia,  HEMATOLOGY: No anemia, easy bruising or bleeding SKIN: No rash or lesion. MUSCULOSKELETAL: No joint pain or arthritis.   NEUROLOGIC: No tingling, numbness, weakness.  PSYCHIATRY: No anxiety or depression.   ROS  DRUG ALLERGIES:  No Active Allergies  VITALS:  Blood pressure (!) 155/79, pulse 95, temperature 98.4 F (36.9 C), temperature source Oral, resp. rate 17, height 4\' 9"  (1.448 m), weight 69.4 kg, last menstrual period 03/06/2005, SpO2 99 %.  PHYSICAL EXAMINATION:  GENERAL:  58 y.o.-year-old patient lying in the bed with no acute distress.  EYES: Pupils equal, round, reactive to light and accommodation. No scleral icterus. Extraocular muscles intact.  HEENT: Head atraumatic, normocephalic. Oropharynx and nasopharynx clear.  Tracheostomy in place and ventilatory support through that. NECK:  Supple, no jugular venous distention. No thyroid enlargement, no tenderness.  LUNGS: Normal breath sounds bilaterally, no wheezing,  rales,rhonchi or crepitation. No use of accessory muscles of respiration.  Ventilatory support through tracheostomy. CARDIOVASCULAR: S1, S2 normal. No murmurs, rubs, or gallops.  ABDOMEN: Soft, nontender, nondistended. Bowel sounds present. No organomegaly or mass.  EXTREMITIES: No pedal edema, cyanosis, or clubbing.  NEUROLOGIC: Cranial nerves II through XII are intact. Muscle strength 4/5 in all extremities. Sensation intact. Gait not checked.  PSYCHIATRIC: The patient is alert and oriented x 3.  SKIN: No obvious rash, lesion, or ulcer.   Physical Exam LABORATORY PANEL:   CBC Recent Labs  Lab 09/19/18 0628  WBC 8.4  HGB 10.0*  HCT 32.7*  PLT 203   ------------------------------------------------------------------------------------------------------------------  Chemistries  Recent Labs  Lab 09/17/18 0120  09/19/18 0628  NA 142   < > 141  K 3.7   < > 3.6  CL 105   < > 111  CO2 25   < > 24  GLUCOSE 101*   < > 106*  BUN 16   < > 27*  CREATININE 0.64   < > 0.68  CALCIUM 9.2   < > 9.0  AST 21  --   --   ALT 16  --   --   ALKPHOS 116  --   --   BILITOT 0.5  --   --    < > = values in this interval not displayed.   ------------------------------------------------------------------------------------------------------------------  Cardiac Enzymes No results for input(s): TROPONINI in the last 168 hours. ------------------------------------------------------------------------------------------------------------------  RADIOLOGY:  Dg Chest Port 1 View  Result Date: 09/18/2018 CLINICAL DATA:  Respiratory failure. EXAM: PORTABLE CHEST 1 VIEW COMPARISON:  One-view chest x-ray 09/17/2018 FINDINGS: Heart size  is normal. Tracheostomy tube is stable. Progressive left lower lobe consolidation is present. The right lung is clear. The visualized soft tissues and bony thorax are unremarkable. IMPRESSION: 1. Progressive left lower lobe airspace consolidation concerning for pneumonia or  aspiration. Electronically Signed   By: San Morelle M.D.   On: 09/18/2018 06:30   [place] Ekg Site Rite  Result Date: 09/18/2018 If Site Rite image not attached, placement could not be confirmed due to current cardiac rhythm.   ASSESSMENT AND PLAN:   Active Problems:   Pneumonia   #1.  Acute on top of chronic hypoxic and hypercarbic respiratory failure secondary to left lower lobe pneumonia.   maintained on mechanical ventilation and admitted to an ICU bed.  She will be continued on IV cefepime, Zithromax and vancomycin.  Negative influenza antigens as well as Legionella and pneumonia antigens.  Scheduled and PRN duo nebs will be provided.  Mucolytic therapy will be provided.  O2 protocol will be followed.  As per intensivist, due to Pseudomonas pneumonia patient will need IV antibiotics.  Total 7 days suggested.  PICC line was placed and ordered cefepime to continue at home for 4 more days by intensivist.  2.  Left lower lobe pneumonia likely bacterial possibly gram-negative with subsequent sepsis without severe sepsis or septic shock.  Management as above.  Blood cultures and tracheal sputum Gram stain, culture and sensitivity will be followed.  The patient's COVID-19 test came back negative.  3.  Acute asthma/COPD exacerbation.   on scheduled and PRN duo nebs as well as scheduled IV Solu-Medrol.  We will hold off her Symbicort and continue her Pulmicort.  We will continue her Spiriva.  4.  Hypertension.  Coreg and Vasotec will be resumed.  5..  Anxiety/depression.  We will continue her Klonopin, Paxil and Seroquel.  6.  Dyslipidemia.  Zocor will be resumed.  7.  DVT prophylaxis.  Subcutaneous Lovenox and GI prophylaxis with continued H2 blocker therapy especially given current steroid therapy.    All the records are reviewed and case discussed with Care Management/Social Workerr. Management plans discussed with the patient, family and they are in agreement.  CODE  STATUS: Full.  TOTAL TIME TAKING CARE OF THIS PATIENT: 35 minutes.     POSSIBLE D/C today, DEPENDING ON CLINICAL CONDITION.   [name] M.D on 09/19/2018   Between 7am to 6pm - Pager - [phone]  After 6pm go to [email or web address] - password [email or web address] [facility]  Office  [phone]  CC: Primary care physician; Frazier Richards, MD  Note: This dictation was prepared with Dragon dictation along with smaller phrase technology. Any transcriptional errors that result from this process are unintentional.

## 2018-09-19 NOTE — Care Plan (Signed)
Patient was set for discharge today however there needed to be some teaching with regards to IV antibiotics for the family and for home health.  Discharge summary has been performed.  All orders updated.  Will set for discharge in the morning.

## 2018-09-19 NOTE — Progress Notes (Addendum)
SLP Cancellation Note  Patient Details Name: Michelle [name] MRN: [id number] DOB: 25-Jan-1961   Cancelled treatment:       Reason Eval/Treat Not Completed: Medical issues which prohibited therapy(chart reviewed; consulted MD then NSG re: pt's status) Per MD discussion, it is recommended that pt f/u w/ her primary care team at [facility] for repeat assessment of swallowing for any new recommendations re: oral diet; initiating an oral diet eating/drinking on vent. Pt does have a PEG placement for TFs but states she "eats some" at home. Pt has a tracheostomy and on chronic vent support - unsure if she uses an inline PMV.  MD gave ok for single ice chips w/ NSG supervision post oral care. Will address w/ pt; general aspiration precautions posted re: ice chips. MD agreed. NSG updated.     Orinda Kenner, MS, CCC-SLP Watson,Katherine 09/19/2018, 12:00 PM

## 2018-09-19 NOTE — Progress Notes (Signed)
Peripherally Inserted Central Catheter/Midline Placement  The IV Nurse has discussed with the patient and/or persons authorized to consent for the patient, the purpose of this procedure and the potential benefits and risks involved with this procedure.  The benefits include less needle sticks, lab draws from the catheter, and the patient may be discharged home with the catheter. Risks include, but not limited to, infection, bleeding, blood clot (thrombus formation), and puncture of an artery; nerve damage and irregular heartbeat and possibility to perform a PICC exchange if needed/ordered by physician.  Alternatives to this procedure were also discussed.  Bard Power PICC patient education guide, fact sheet on infection prevention and patient information card has been provided to patient /or left at bedside.    PICC/Midline Placement Documentation  PICC Single Lumen 33/29/51 PICC Right Basilic 39 cm 0 cm (Active)  Indication for Insertion or Continuance of Line Prolonged intravenous therapies 09/19/2018  9:19 AM  Exposed Catheter (cm) 0 cm 09/19/2018  9:19 AM  Site Assessment Dry;Clean;Intact 09/19/2018  9:19 AM  Line Status Flushed 09/19/2018  9:19 AM  Dressing Type Transparent 09/19/2018  9:19 AM  Dressing Status Clean;Dry;Intact;Antimicrobial disc in place 09/19/2018  9:19 AM  Dressing Intervention New dressing 09/19/2018  9:19 AM  Dressing Change Due 09/26/18 09/19/2018  9:19 AM       Christella Noa Albarece 09/19/2018, 9:20 AM

## 2018-09-19 NOTE — Consult Note (Addendum)
Pharmacy Antibiotic Note  Michelle [name] is a 58 y.o. female admitted on 09/17/2018 with sepsis.  Diagnosed with acute on chronic COPD with pneumonia. Patient is currently intubated - is on chronic ventilator at home, also. Patient also has chronic trach. Pharmacy has been consulted for Cefepime and Vancomycin dosing.   Plan: Continue Cefepime 2g IV every 8 hours. D3 of antibiotic therapy. Per CCM discussion, will complete 7 days of Cefepime therapy for pseudomonal PNA. PICC placed with plans for transition to home.  Height: 4\' 9"  (144.8 cm) Weight: 153 lb (69.4 kg) IBW/kg (Calculated) : 38.6  Temp (24hrs), Avg:98.2 F (36.8 C), Min:98 F (36.7 C), Max:98.3 F (36.8 C)  Recent Labs  Lab 09/17/18 0120 09/17/18 0402 09/18/18 0318 09/19/18 0628  WBC 12.4*  --  14.5* 8.4  CREATININE 0.64  --  0.67 0.68  LATICACIDVEN 2.9* 1.2  --   --     Estimated Creatinine Clearance: 62.3 mL/min (by C-G formula based on SCr of 0.68 mg/dL).    No Active Allergies  Antimicrobials this admission: 4/15 cefepime>> 4/21 4/15 vancomycin  >> 4/16 4/15 azithromycin >> 4/16 4/15 Flagyl x 1  Dose adjustments this admission: 4/15 Cefepime increased to 2 g IV q8h (from 12 hours) 4/15 Vancomycin decreased to 1000 mg q24h   Microbiology results: 4/15 BCx: Bacillus species, anaerobic bottle only (contaminant) 4/15 Resp Cx Pseudomonas (pan-sensitive) 4/15 UCx: NG 4/15 MRSA PCR: (-) 4/15 SARS Coronavirus 2: negative   Thank you for allowing pharmacy to be a part of this patient's care.   Paticia Stack, PharmD Pharmacy Resident  09/19/2018 11:17 AM

## 2018-09-19 NOTE — TOC Transition Note (Addendum)
Transition of Care [facility]) - CM/SW Discharge Note   Patient Details  Name: STORMI VANDEVELDE MRN: [id number] Date of Birth: 02/10/1961  Transition of [facility] CM/SW Contact:  Latanya Maudlin, RN Phone Number: 09/19/2018, 3:42 PM   Clinical Narrative:   Patient to be discharged per MD order. Orders in place for home health services. Patient is active with [facility]. Patient is not satisfied with this services but understands she needs to contact Medicaid if interested in changing. Patient has hospital bed, wheelchair and home vent supplies all in place at home. Patient will require IV abx after this hospitalization. PICC in place. Notified Pam from [facility] of referral. Pam to contact daughter and schedule time to provide education and schedule drop off of medications,etc. Patient will use EMS for transfer once abx administration is coordinated.  Update 4/18 11;17- Patient to discharge on home IV abx. Will use [facility] to administer which was coordinated by Pam with [facility]. Pam scheduled education session with daughters and will obtain hard script today from hospital. [facility] who provides home health aide can offer an RN for IV administration starting Monday. Faxed referral to them. Will use EMS or [facility] for transport     Final next level of care: [place] Barriers to Discharge: No Barriers Identified   Patient Goals and CMS Choice Patient states their goals for this hospitalization and ongoing recovery are:: "want to swich PCS services with medicaid" CMS Medicare.gov Compare Post Acute Care list provided to:: Patient Choice offered to / list presented to : Patient  Discharge Placement                       Discharge Plan and Services   Discharge Planning Services: CM Consult Post Acute Care Choice: Home Health              HH Arranged: RN, IV Antibiotics     Social Determinants of Health (SDOH) Interventions      Readmission Risk Interventions Readmission Risk Prevention Plan 09/19/2018  Transportation Screening Complete  PCP or Specialist Appt within 5-7 Days Complete  Home Care Screening Complete  Medication Review (RN CM) Complete  Some recent data might be hidden

## 2018-09-20 LAB — BASIC METABOLIC PANEL
Anion gap: 9 (ref 5–15)
BUN: 24 mg/dL — ABNORMAL HIGH (ref 6–20)
CO2: 26 mmol/L (ref 22–32)
Calcium: 8.8 mg/dL — ABNORMAL LOW (ref 8.9–10.3)
Chloride: 105 mmol/L (ref 98–111)
Creatinine, Ser: 0.6 mg/dL (ref 0.44–1.00)
GFR calc Af Amer: >60 mL/min (ref >60)
GFR calc non Af Amer: >60 mL/min (ref >60)
Glucose, Bld: 101 mg/dL — ABNORMAL HIGH (ref 70–99)
Potassium: 3.3 mmol/L — ABNORMAL LOW (ref 3.5–5.1)
Sodium: 140 mmol/L (ref 135–145)

## 2018-09-20 LAB — PHOSPHORUS: Phosphorus: 2.2 mg/dL — ABNORMAL LOW (ref 2.5–4.6)

## 2018-09-20 LAB — LEGIONELLA PNEUMOPHILA SEROGP 1 UR AG: L. pneumophila Serogp 1 Ur Ag: NEGATIVE

## 2018-09-20 LAB — MAGNESIUM: Magnesium: 2 mg/dL (ref 1.7–2.4)

## 2018-09-20 MED ORDER — SODIUM CHLORIDE 0.9 % IV SOLN: 2.0000 g | Freq: Three times a day (TID) | INTRAVENOUS | 0 refills | Status: AC

## 2018-09-20 MED ORDER — POTASSIUM CHLORIDE 20 MEQ PO PACK
40.0000 meq | PACK | Freq: Once | ORAL | Status: AC
  Administered 2018-09-20: 07:00:00 40 meq via ORAL
  Filled 2018-09-20: qty 2

## 2018-09-20 NOTE — Progress Notes (Signed)
Patient discharged with EMS.  Daughter at bedside to help with transfer and vent support if needed for EMS.

## 2018-09-20 NOTE — Progress Notes (Addendum)
Infectious Disease Long Term IV Antibiotic Orders Michelle [name] Apr 07, 1961  Diagnosis: Pseudomonas pneumonia  Culture results @micro @  LABS Lab Results  Component Value Date   CREATININE 0.60 09/20/2018   Lab Results  Component Value Date   WBC 8.4 09/19/2018   HGB 10.0 (L) 09/19/2018   HCT 32.7 (L) 09/19/2018   MCV 87.7 09/19/2018   PLT 203 09/19/2018   No results found for: ESRSEDRATE, POCTSEDRATE No results found for: CRP  Allergies: No Active Allergies  Discharge antibiotics  Cefapime      2 grams IV every  8 hours   PICC Care per protocol Labs weekly while on IV antibiotics CBC w diff   Comprehensive met panel Vancomycin Trough   CRP   Planned duration of antibiotics 7 days  Stop date 09/24/2018 Follow up with PCP in 4-5 days  Hillary Bow, MD  [phone]

## 2018-09-20 NOTE — Care Plan (Signed)
No issues overnight.  Discharge paperwork done.  Patient may be discharged when transportation available for home.  All prescriptions done and necessary arrangements for home health have been made.  She will need a total of 7 days of antibiotics for pneumonia his left lower lobe pneumonia.

## 2018-09-22 LAB — CULTURE, BLOOD (ROUTINE X 2): Culture: NO GROWTH

## 2018-10-12 IMAGING — DX DG CHEST 1V PORT
1 series · 1 of 1 positions shown · non-contrast
Comparison: 02/22/2017

CLINICAL DATA: Respiratory distress. Shortness of breath. History
of COPD and CHF.

EXAM:
PORTABLE CHEST 1 VIEW

[chest ap]
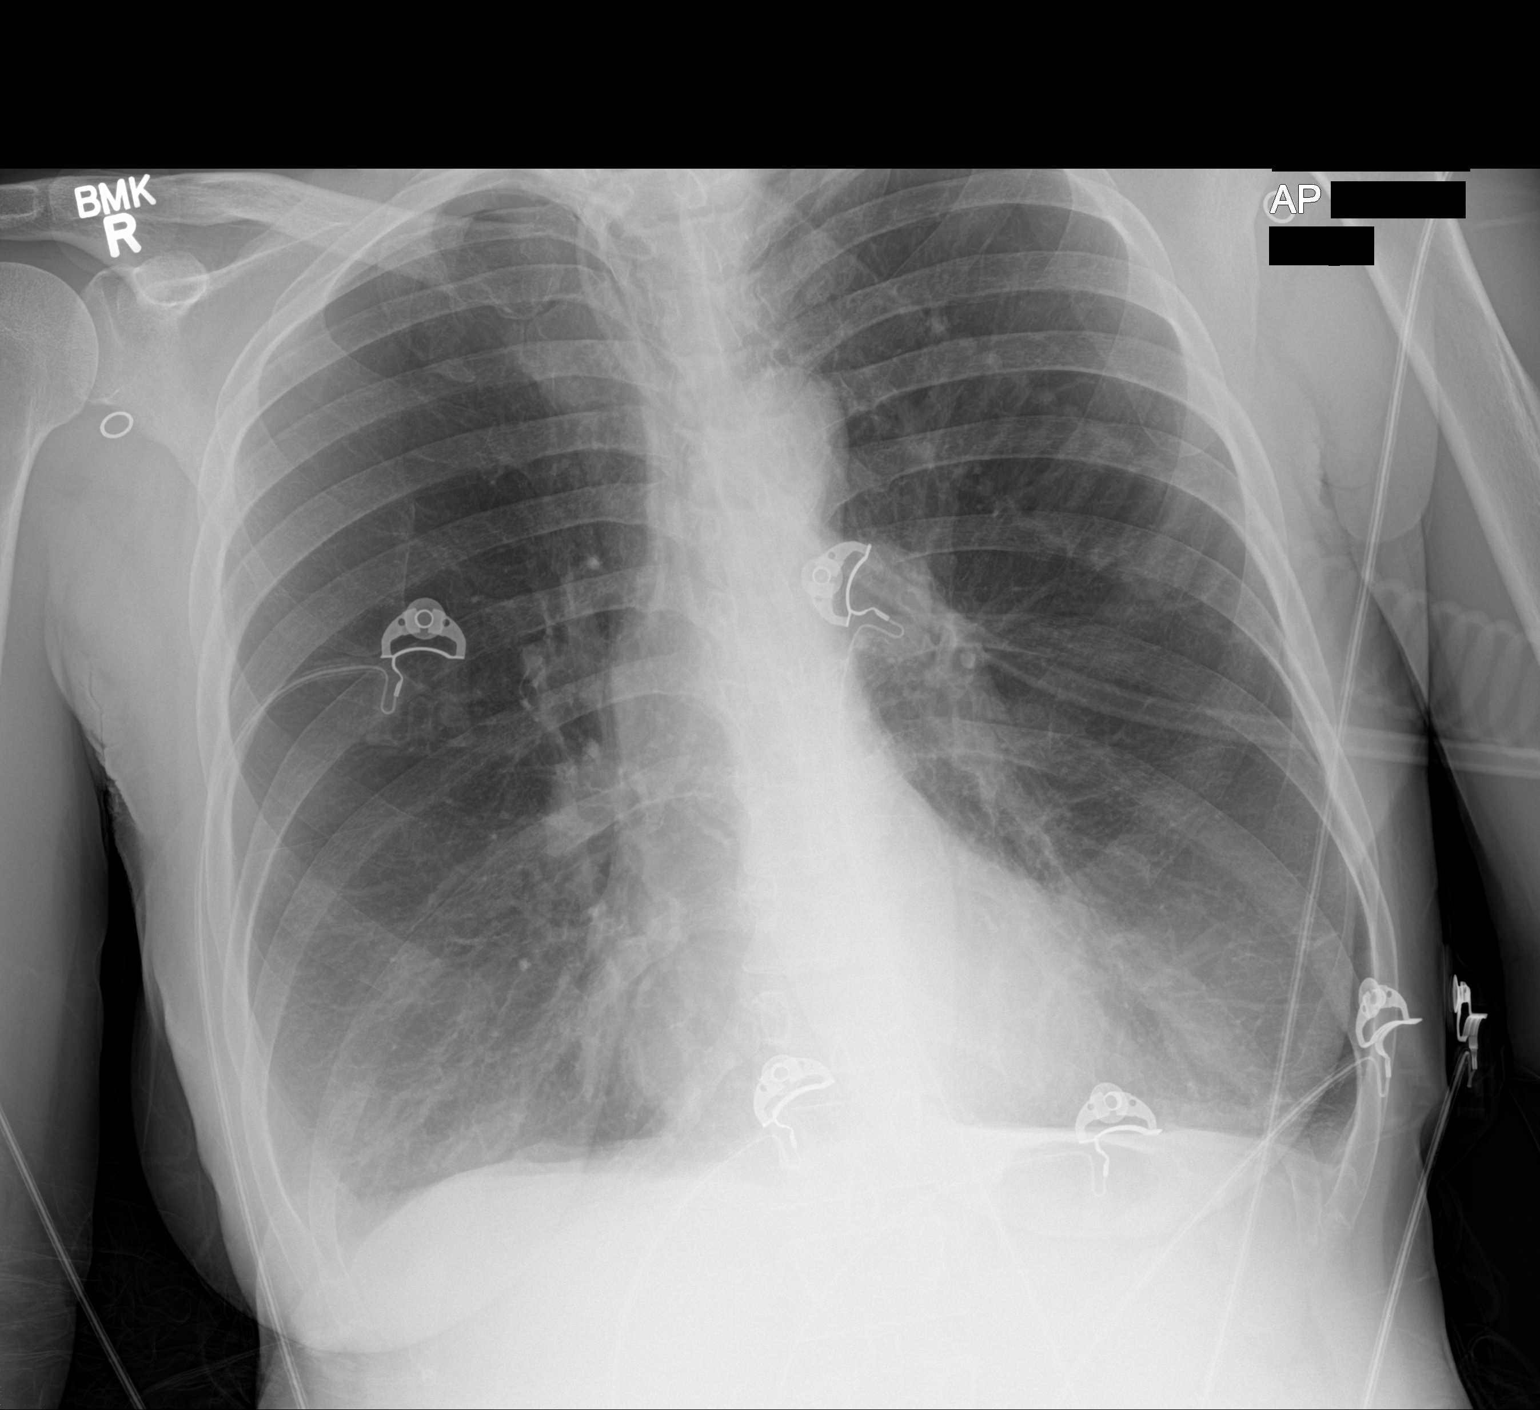

[1 of 1 positions shown; findings below may reference images not displayed]

FINDINGS: Hyperinflation likely indicating emphysema. Heart size and pulmonary
vascularity are normal. No airspace disease or consolidation in the
lungs. No blunting of costophrenic angles. No pneumothorax.
Calcification of the aorta.
IMPRESSION: Emphysematous changes in the lungs. No evidence of active pulmonary
disease. Aortic atherosclerosis.

## 2018-10-19 ENCOUNTER — Inpatient Hospital Stay
Admission: EM | Admit: 2018-10-19 | Discharge: 2018-10-23 | DRG: 208 | Disposition: A | Discharge status: Home Health | Payer: Medicaid Other | Attending: Internal Medicine | Admitting: Internal Medicine

## 2018-10-19 ENCOUNTER — Emergency Department: Payer: Medicaid Other

## 2018-10-19 ENCOUNTER — Other Ambulatory Visit: Payer: Self-pay

## 2018-10-19 ENCOUNTER — Encounter: Payer: Self-pay | Admitting: Emergency Medicine

## 2018-10-19 DIAGNOSIS — J9610 Chronic respiratory failure, unspecified whether with hypoxia or hypercapnia: Secondary | ICD-10-CM

## 2018-10-19 DIAGNOSIS — I5033 Acute on chronic diastolic (congestive) heart failure: Secondary | ICD-10-CM | POA: Diagnosis present

## 2018-10-19 DIAGNOSIS — Z9911 Dependence on respirator [ventilator] status: Secondary | ICD-10-CM

## 2018-10-19 DIAGNOSIS — Z825 Family history of asthma and other chronic lower respiratory diseases: Secondary | ICD-10-CM

## 2018-10-19 DIAGNOSIS — R0602 Shortness of breath: Secondary | ICD-10-CM

## 2018-10-19 DIAGNOSIS — Z8249 Family history of ischemic heart disease and other diseases of the circulatory system: Secondary | ICD-10-CM

## 2018-10-19 DIAGNOSIS — Z79891 Long term (current) use of opiate analgesic: Secondary | ICD-10-CM

## 2018-10-19 DIAGNOSIS — F17210 Nicotine dependence, cigarettes, uncomplicated: Secondary | ICD-10-CM | POA: Diagnosis present

## 2018-10-19 DIAGNOSIS — Z801 Family history of malignant neoplasm of trachea, bronchus and lung: Secondary | ICD-10-CM

## 2018-10-19 DIAGNOSIS — Z930 Tracheostomy status: Secondary | ICD-10-CM

## 2018-10-19 DIAGNOSIS — J441 Chronic obstructive pulmonary disease with (acute) exacerbation: Secondary | ICD-10-CM | POA: Diagnosis present

## 2018-10-19 DIAGNOSIS — I2510 Atherosclerotic heart disease of native coronary artery without angina pectoris: Secondary | ICD-10-CM | POA: Diagnosis present

## 2018-10-19 DIAGNOSIS — Z79899 Other long term (current) drug therapy: Secondary | ICD-10-CM

## 2018-10-19 DIAGNOSIS — J439 Emphysema, unspecified: Principal | ICD-10-CM | POA: Diagnosis present

## 2018-10-19 DIAGNOSIS — I110 Hypertensive heart disease with heart failure: Secondary | ICD-10-CM | POA: Diagnosis present

## 2018-10-19 DIAGNOSIS — J449 Chronic obstructive pulmonary disease, unspecified: Secondary | ICD-10-CM | POA: Diagnosis present

## 2018-10-19 DIAGNOSIS — Z823 Family history of stroke: Secondary | ICD-10-CM

## 2018-10-19 DIAGNOSIS — Z803 Family history of malignant neoplasm of breast: Secondary | ICD-10-CM

## 2018-10-19 DIAGNOSIS — I160 Hypertensive urgency: Secondary | ICD-10-CM | POA: Diagnosis present

## 2018-10-19 DIAGNOSIS — Z7951 Long term (current) use of inhaled steroids: Secondary | ICD-10-CM

## 2018-10-19 DIAGNOSIS — Z20828 Contact with and (suspected) exposure to other viral communicable diseases: Secondary | ICD-10-CM | POA: Diagnosis present

## 2018-10-19 DIAGNOSIS — J9621 Acute and chronic respiratory failure with hypoxia: Secondary | ICD-10-CM | POA: Diagnosis present

## 2018-10-19 DIAGNOSIS — I5032 Chronic diastolic (congestive) heart failure: Secondary | ICD-10-CM | POA: Diagnosis present

## 2018-10-19 DIAGNOSIS — Z7982 Long term (current) use of aspirin: Secondary | ICD-10-CM

## 2018-10-19 DIAGNOSIS — I10 Essential (primary) hypertension: Secondary | ICD-10-CM | POA: Diagnosis present

## 2018-10-19 DIAGNOSIS — J9620 Acute and chronic respiratory failure, unspecified whether with hypoxia or hypercapnia: Secondary | ICD-10-CM

## 2018-10-19 LAB — CBC WITH DIFFERENTIAL/PLATELET
Abs Immature Granulocytes: 0.05 10*3/uL (ref 0.00–0.07)
Basophils Absolute: 0 10*3/uL (ref 0.0–0.1)
Basophils Relative: 0 %
Eosinophils Absolute: 0.1 10*3/uL (ref 0.0–0.5)
Eosinophils Relative: 1 %
HCT: 36.7 % (ref 36.0–46.0)
Hemoglobin: 11.2 g/dL — ABNORMAL LOW (ref 12.0–15.0)
Immature Granulocytes: 1 %
Lymphocytes Relative: 14 %
Lymphs Abs: 1.1 10*3/uL (ref 0.7–4.0)
MCH: 26.9 pg (ref 26.0–34.0)
MCHC: 30.5 g/dL (ref 30.0–36.0)
MCV: 88 fL (ref 80.0–100.0)
Monocytes Absolute: 0.4 10*3/uL (ref 0.1–1.0)
Monocytes Relative: 6 %
Neutro Abs: 5.9 10*3/uL (ref 1.7–7.7)
Neutrophils Relative %: 78 %
Platelets: 229 10*3/uL (ref 150–400)
RBC: 4.17 MIL/uL (ref 3.87–5.11)
RDW: 13.3 % (ref 11.5–15.5)
WBC: 7.6 10*3/uL (ref 4.0–10.5)
nRBC: 0 % (ref 0.0–0.2)

## 2018-10-19 LAB — LACTIC ACID, PLASMA: Lactic Acid, Venous: 1.6 mmol/L (ref 0.5–1.9)

## 2018-10-19 LAB — APTT: aPTT: 32 seconds (ref 24–36)

## 2018-10-19 LAB — COMPREHENSIVE METABOLIC PANEL
ALT: 17 U/L (ref 0–44)
AST: 23 U/L (ref 15–41)
Albumin: 3.6 g/dL (ref 3.5–5.0)
Alkaline Phosphatase: 132 U/L — ABNORMAL HIGH (ref 38–126)
Anion gap: 7 (ref 5–15)
BUN: 15 mg/dL (ref 6–20)
CO2: 24 mmol/L (ref 22–32)
Calcium: 8.3 mg/dL — ABNORMAL LOW (ref 8.9–10.3)
Chloride: 111 mmol/L (ref 98–111)
Creatinine, Ser: 0.67 mg/dL (ref 0.44–1.00)
GFR calc Af Amer: >60 mL/min (ref >60)
GFR calc non Af Amer: >60 mL/min (ref >60)
Glucose, Bld: 193 mg/dL — ABNORMAL HIGH (ref 70–99)
Potassium: 3.3 mmol/L — ABNORMAL LOW (ref 3.5–5.1)
Sodium: 142 mmol/L (ref 135–145)
Total Bilirubin: 0.3 mg/dL (ref 0.3–1.2)
Total Protein: 7.2 g/dL (ref 6.5–8.1)

## 2018-10-19 LAB — LIPASE, BLOOD: Lipase: 28 U/L (ref 11–51)

## 2018-10-19 LAB — PROTIME-INR
INR: 1 (ref 0.8–1.2)
Prothrombin Time: 12.6 seconds (ref 11.4–15.2)

## 2018-10-19 LAB — SARS CORONAVIRUS 2 BY RT PCR (HOSPITAL ORDER, PERFORMED IN ~~LOC~~ HOSPITAL LAB): SARS Coronavirus 2: NEGATIVE

## 2018-10-19 MED ORDER — MAGNESIUM SULFATE 2 GM/50ML IV SOLN
2.0000 g | INTRAVENOUS | Status: AC
  Administered 2018-10-19: 2 g via INTRAVENOUS
  Filled 2018-10-19: qty 50

## 2018-10-19 MED ORDER — VANCOMYCIN HCL IN DEXTROSE 1-5 GM/200ML-% IV SOLN
1000.0000 mg | Freq: Once | INTRAVENOUS | Status: AC
  Administered 2018-10-20: 01:00:00 1000 mg via INTRAVENOUS
  Filled 2018-10-19: qty 200

## 2018-10-19 MED ORDER — SODIUM CHLORIDE 0.9 % IV SOLN
2.0000 g | Freq: Once | INTRAVENOUS | Status: AC
  Administered 2018-10-19: 2 g via INTRAVENOUS
  Filled 2018-10-19: qty 2

## 2018-10-19 NOTE — Progress Notes (Signed)
Spoke with Dr Archie Balboa about this patient. Patient currently on her home vent trilogy tidal volume 450 rr 16 peep 5 with 5liter bleed in.  He stated ems had given her several svn treatments prior to arrival so did not want additional meds for now. Patient tolerating her settings well. Will continue to monitor for changes.

## 2018-10-19 NOTE — ED Notes (Signed)
IV team here to attempt access.

## 2018-10-19 NOTE — ED Notes (Signed)
MD aware of poor access. Will start antibiotics once mag finished or second iv established.

## 2018-10-19 NOTE — ED Triage Notes (Signed)
EMS pt to rm 1 from home with report of dc from hospital 1 month ago with pneumonia. On antibotics. Over the last 5 hours having increasing shortness of breath. Pt has trach and is on home vent.

## 2018-10-19 NOTE — ED Provider Notes (Signed)
[facility] Emergency Department Provider Note  ____________________________________________  Time seen: Approximately 10:41 PM  I have reviewed the triage vital signs and the nursing notes.   HISTORY  Chief Complaint Respiratory Distress and Shortness of Breath    HPI Michelle [name] is a 58 y.o. female with a history of CHF COPD hypertension, chronic tracheostomy and ventilator dependence and recent hospitalization for sepsis and Pseudomonas pneumonia  who comes back to the ED today via EMS due to shortness of breath worsening over the past 5 hours and now severe.  EMS report they gave 125 mg Solu-Medrol IV and 2 duo nebs during transport.  During nebulizer treatments patient's breathing was more comfortable, but once they are finished she has increased respiratory distress again.  Patient reports associated right-sided chest pain.  No cough or purulent sputum.  Denies fever.  Shortness of breath is been constant without aggravating or alleviating factors today.     Past Medical History:  Diagnosis Date  . Allergy   . Anxiety   . Asthma   . CHF (congestive heart failure) ([place])   . Cocaine abuse ([place])   . COPD (chronic obstructive pulmonary disease) ([place])   . Coronary artery disease   . Emphysema/COPD ([place])   . Hepatitis C   . High cholesterol   . Hypertension   . Pneumothorax   . Tobacco abuse      Patient Active Problem List   Diagnosis Date Noted  . Pneumonia 09/17/2018  . Panic disorder 10/29/2017  . Altered mental status   . Pressure injury of skin 10/23/2017  . Acute on chronic respiratory failure ([place]) 09/30/2017  . Acute respiratory failure ([place]) 08/20/2017  . Acute on chronic respiratory failure with hypoxia and hypercapnia ([place]) 06/29/2017  . Acute on chronic respiratory failure with hypoxia ([place]) 02/23/2017  . Protein-calorie malnutrition, severe 05/23/2016  . Sepsis ([place]) 05/22/2016  . Malnutrition of moderate degree ([place])  10/25/2014  . COPD exacerbation ([place]) 10/24/2014  . Tobacco abuse 10/24/2014     Past Surgical History:  Procedure Laterality Date  . CARDIAC CATHETERIZATION    . CHEST TUBE INSERTION    . IR GASTROSTOMY TUBE MOD SED  10/31/2017  . TRACHEOSTOMY TUBE PLACEMENT N/A 10/17/2017   Procedure: TRACHEOSTOMY;  Surgeon: Carloyn Manner, MD;  Location: [facility] ORS;  Service: ENT;  Laterality: N/A;     Prior to Admission medications   Medication Sig Start Date End Date Taking? Authorizing Provider  AMBULATORY NON FORMULARY MEDICATION Medication Name: Pulse oximeter DME:[facility] DX J44.9....J96.21 01/08/18   Flora Lipps, MD  aspirin 81 MG chewable tablet Chew 81 mg by mouth daily.    [provider]  budesonide (PULMICORT) 0.25 MG/2ML nebulizer solution Take 2 mLs (0.25 mg total) by nebulization 2 (two) times daily. 12/31/17   Conforti, John, DO  carvedilol (COREG) 3.125 MG tablet Take 3.125 mg by mouth 2 (two) times daily with a meal.    [provider]  cetirizine (ZYRTEC) 10 MG tablet Take 10 mg by mouth daily.    [provider]  clonazePAM (KLONOPIN) 0.5 MG tablet Place 1 tablet (0.5 mg total) into feeding tube daily. Patient taking differently: Take 1 mg by mouth 2 (two) times daily. Takes 1 tablet bid and prn for anxiety 01/07/18   Flora Lipps, MD  enalapril (VASOTEC) 5 MG tablet Take 5 mg by mouth daily.    [provider]  famotidine (PEPCID) 20 MG tablet Place 1 tablet (20 mg total) into feeding  tube daily. 12/31/17   Conforti, John, DO  fentaNYL (DURAGESIC - DOSED MCG/HR) 25 MCG/HR patch Place 1 patch (25 mcg total) onto the skin every 3 (three) days. 01/07/18   Flora Lipps, MD  fluticasone (FLONASE) 50 MCG/ACT nasal spray Place 2 sprays into both nostrils daily.    [provider]  ipratropium-albuterol (DUONEB) 0.5-2.5 (3) MG/3ML SOLN Take 3 mLs by nebulization every 6 (six) hours. 12/31/17   Conforti, John, DO  levalbuterol (XOPENEX) 1.25 MG/3ML  nebulizer solution Take 1.25 mg by nebulization every 4 (four) hours as needed for wheezing.    [provider]  mirtazapine (REMERON) 45 MG tablet Take 45 mg by mouth at bedtime.    [provider]  oxyCODONE-acetaminophen (PERCOCET/ROXICET) 5-325 MG tablet Place 1 tablet into feeding tube every 8 (eight) hours as needed for severe pain. 01/07/18   Flora Lipps, MD  PARoxetine (PAXIL) 20 MG tablet Place 1 tablet (20 mg total) into feeding tube at bedtime. 12/31/17   Conforti, John, DO  QUEtiapine (SEROQUEL) 25 MG tablet Place 1 tablet (25 mg total) into feeding tube at bedtime. 12/31/17   Conforti, John, DO  senna (SENOKOT) 8.6 MG TABS tablet Take 1 tablet by mouth daily as needed for mild constipation.    [provider]  simvastatin (ZOCOR) 20 MG tablet Take 40 mg by mouth daily at 6 PM.    [provider]     Allergies Patient has no known allergies.   Family History  Problem Relation Age of Onset  . Lung cancer Mother   . Asthma Mother   . CVA Father   . CAD Father   . Stroke Father   . Breast cancer Maternal Aunt 67  . COPD Sister     Social History Social History   Tobacco Use  . Smoking status: Current Every Day Smoker    Packs/day: 0.10    Years: 41.00    Pack years: 4.10    Types: Cigarettes  . Smokeless tobacco: Never Used  Substance Use Topics  . Alcohol use: No    Frequency: Never  . Drug use: Yes    Types: "Crack" cocaine    Comment: Past cocaine use.  Quit 04/2017    Review of Systems  Constitutional:   No fever or chills.  ENT:   No sore throat. No rhinorrhea. Cardiovascular:   Positive chest pain without syncope. Respiratory: Positive shortness of breath without cough. Gastrointestinal:   Negative for abdominal pain, vomiting and diarrhea.  Musculoskeletal:   Negative for focal pain or swelling All other systems reviewed and are negative except as documented above in ROS and  HPI.  ____________________________________________   PHYSICAL EXAM:  VITAL SIGNS: ED Triage Vitals  Enc Vitals Group     BP 10/19/18 2136 (!) 160/86     Pulse Rate 10/19/18 2136 (!) 113     Resp 10/19/18 2136 (!) 24     Temp 10/19/18 2136 98.4 F (36.9 C)     Temp Source 10/19/18 2136 Oral     SpO2 10/19/18 2136 96 %     Weight 10/19/18 2138 130 lb (59 kg)     Height 10/19/18 2138 5\' 2"  (1.575 m)     Head Circumference --      Peak Flow --      Pain Score 10/19/18 2137 8     Pain Loc --      Pain Edu? --      Excl. in [place]? --  Vital signs reviewed, nursing assessments reviewed.   Constitutional:   Alert and oriented.  Ill-appearing. Eyes:   Conjunctivae are normal. EOMI. PERRL. ENT      Head:   Normocephalic and atraumatic.      Nose:   No congestion/rhinnorhea.       Mouth/Throat:   MMM, no pharyngeal erythema. No peritonsillar mass.       Neck:   No meningismus. Full ROM. Hematological/Lymphatic/Immunilogical:   No cervical lymphadenopathy. Cardiovascular:   Tachycardia heart rate 115. Symmetric bilateral radial and DP pulses.  No murmurs. Cap refill less than 2 seconds. Respiratory:   Tachypnea, increased work of breathing.  Scant wheezing diffusely.  Symmetric breath sounds, no focal crackles. Gastrointestinal:   Soft and nontender. Non distended. There is no CVA tenderness.  No rebound, rigidity, or guarding. Musculoskeletal:   Normal range of motion in all extremities. No joint effusions.  No lower extremity tenderness.  No edema. Neurologic:   Normal speech and language.  Motor grossly intact. No acute focal neurologic deficits are appreciated.  Skin:    Skin is warm, dry and intact. No rash noted.  No petechiae, purpura, or bullae.  ____________________________________________    LABS (pertinent positives/negatives) (all labs ordered are listed, but only abnormal results are displayed) Labs Reviewed  CULTURE, BLOOD (ROUTINE X 2)  CULTURE, BLOOD  (ROUTINE X 2)  URINE CULTURE  SARS CORONAVIRUS 2 (HOSPITAL ORDER, [facility])  LACTIC ACID, PLASMA  LACTIC ACID, PLASMA  COMPREHENSIVE METABOLIC PANEL  LIPASE, BLOOD  CBC WITH DIFFERENTIAL/PLATELET  PROCALCITONIN  APTT  PROTIME-INR  URINALYSIS, COMPLETE (UACMP) WITH MICROSCOPIC   ____________________________________________   EKG  Interpreted by me Sinus tachycardia rate 114, right axis, prolonged QTC of 5 2 5  ms.  Right bundle branch block.  No acute ischemic changes.  ____________________________________________    RADIOLOGY  Dg Chest Port 1 View  Result Date: 10/19/2018 CLINICAL DATA:  58 y/o F; increasing shortness of breath. Under treatment for pneumonia. EXAM: PORTABLE CHEST 1 VIEW COMPARISON:  09/18/2018 chest radiograph FINDINGS: Stable normal cardiac silhouette. Improved aeration at the left lung base. No new focal consolidation. No pleural effusion or pneumothorax. Tracheostomy tube projects over the tracheal air column. Bones are unremarkable. IMPRESSION: Improved aeration at the left lung base. No new focal consolidation. Electronically Signed   By: Kristine Garbe M.D.   On: 10/19/2018 22:17    ____________________________________________   PROCEDURES .Critical Care Performed by: Carrie Mew, MD Authorized by: Carrie Mew, MD   Critical care provider statement:    Critical care time (minutes):  35   Critical care time was exclusive of:  Separately billable procedures and treating other patients   Critical care was necessary to treat or prevent imminent or life-threatening deterioration of the following conditions:  Sepsis and respiratory failure   Critical care was time spent personally by me on the following activities:  Development of treatment plan with patient or surrogate, discussions with consultants, evaluation of patient's response to treatment, examination of patient, obtaining history from patient or  surrogate, ordering and performing treatments and interventions, ordering and review of laboratory studies, ordering and review of radiographic studies, pulse oximetry, re-evaluation of patient's condition and review of old charts    ____________________________________________  DIFFERENTIAL DIAGNOSIS   Ventilator associated pneumonia, sepsis, COVID-19 infection, pulmonary embolism, pulmonary edema, pneumothorax, pleural effusion, non-STEMI  CLINICAL IMPRESSION / ASSESSMENT AND PLAN / ED COURSE  Medications ordered in the ED: Medications  vancomycin (VANCOCIN) IVPB 1000  mg/200 mL premix (has no administration in time range)  ceFEPIme (MAXIPIME) 2 g in sodium chloride 0.9 % 100 mL IVPB (has no administration in time range)  magnesium sulfate IVPB 2 g 50 mL (2 g Intravenous New Bag/Given 10/19/18 2230)    Pertinent labs & imaging results that were available during my care of the patient were reviewed by me and considered in my medical decision making (see chart for details).  Michelle [name] was evaluated in Emergency Department on 10/19/2018 for the symptoms described in the history of present illness. She was evaluated in the context of the global COVID-19 pandemic, which necessitated consideration that the patient might be at risk for infection with the SARS-CoV-2 virus that causes COVID-19. Institutional protocols and algorithms that pertain to the evaluation of patients at risk for COVID-19 are in a state of rapid change based on information released by regulatory bodies including the CDC and federal and state organizations. These policies and algorithms were followed during the patient's care in the ED.   Patient presents with chest pain shortness of breath, found to have tachycardia tachypnea.  She is severely ill at baseline with tracheostomy and ventilator dependence and was recently hospitalized and treated for Pseudomonas pneumonia.  She is high risk for a recurrent healthcare  associated/complicated pneumonia.  Sepsis protocol initiated on arrival, empiric treatment with cefepime and vancomycin.  Also checking a COVID swab given recent hospitalization.    ----------------------------------------- 10:45 PM on 10/19/2018 -----------------------------------------  Difficult lab draw.  Nursing unable to obtain blood cultures despite repeated attempts.  Will give antibiotics empirically without blood culture to avoid delay.  Chest x-ray unremarkable and nondiagnostic, so we will proceed with a CT angiogram to evaluate for PE.  Care will be signed out to oncoming physician to follow-up on results.  Patient will likely need to be hospitalized for COPD exacerbation if the rest of the work-up is reassuring due to her severe lung disease at baseline.      ____________________________________________   FINAL CLINICAL IMPRESSION(S) / ED DIAGNOSES    Final diagnoses:  Shortness of breath     ED Discharge Orders    None      Portions of this note were generated with dragon dictation software. Dictation errors may occur despite best attempts at proofreading.   Carrie Mew, MD 10/19/18 2246

## 2018-10-19 NOTE — ED Notes (Signed)
IV team continues looking for access.

## 2018-10-20 ENCOUNTER — Telehealth: Payer: Self-pay | Admitting: Primary Care

## 2018-10-20 ENCOUNTER — Inpatient Hospital Stay: Payer: Medicaid Other

## 2018-10-20 DIAGNOSIS — Z803 Family history of malignant neoplasm of breast: Secondary | ICD-10-CM | POA: Diagnosis not present

## 2018-10-20 DIAGNOSIS — J9610 Chronic respiratory failure, unspecified whether with hypoxia or hypercapnia: Secondary | ICD-10-CM

## 2018-10-20 DIAGNOSIS — Z7982 Long term (current) use of aspirin: Secondary | ICD-10-CM | POA: Diagnosis not present

## 2018-10-20 DIAGNOSIS — I2510 Atherosclerotic heart disease of native coronary artery without angina pectoris: Secondary | ICD-10-CM | POA: Diagnosis present

## 2018-10-20 DIAGNOSIS — I110 Hypertensive heart disease with heart failure: Secondary | ICD-10-CM | POA: Diagnosis present

## 2018-10-20 DIAGNOSIS — J441 Chronic obstructive pulmonary disease with (acute) exacerbation: Secondary | ICD-10-CM

## 2018-10-20 DIAGNOSIS — Z801 Family history of malignant neoplasm of trachea, bronchus and lung: Secondary | ICD-10-CM | POA: Diagnosis not present

## 2018-10-20 DIAGNOSIS — R0602 Shortness of breath: Secondary | ICD-10-CM | POA: Diagnosis present

## 2018-10-20 DIAGNOSIS — Z79891 Long term (current) use of opiate analgesic: Secondary | ICD-10-CM | POA: Diagnosis not present

## 2018-10-20 DIAGNOSIS — Z930 Tracheostomy status: Secondary | ICD-10-CM | POA: Diagnosis not present

## 2018-10-20 DIAGNOSIS — I160 Hypertensive urgency: Secondary | ICD-10-CM | POA: Diagnosis present

## 2018-10-20 DIAGNOSIS — Z823 Family history of stroke: Secondary | ICD-10-CM | POA: Diagnosis not present

## 2018-10-20 DIAGNOSIS — I5033 Acute on chronic diastolic (congestive) heart failure: Secondary | ICD-10-CM | POA: Diagnosis present

## 2018-10-20 DIAGNOSIS — I5032 Chronic diastolic (congestive) heart failure: Secondary | ICD-10-CM | POA: Diagnosis present

## 2018-10-20 DIAGNOSIS — Z20828 Contact with and (suspected) exposure to other viral communicable diseases: Secondary | ICD-10-CM | POA: Diagnosis present

## 2018-10-20 DIAGNOSIS — J449 Chronic obstructive pulmonary disease, unspecified: Secondary | ICD-10-CM | POA: Diagnosis not present

## 2018-10-20 DIAGNOSIS — J9621 Acute and chronic respiratory failure with hypoxia: Secondary | ICD-10-CM | POA: Diagnosis not present

## 2018-10-20 DIAGNOSIS — Z9911 Dependence on respirator [ventilator] status: Secondary | ICD-10-CM | POA: Diagnosis not present

## 2018-10-20 DIAGNOSIS — Z79899 Other long term (current) drug therapy: Secondary | ICD-10-CM | POA: Diagnosis not present

## 2018-10-20 DIAGNOSIS — F17210 Nicotine dependence, cigarettes, uncomplicated: Secondary | ICD-10-CM | POA: Diagnosis present

## 2018-10-20 DIAGNOSIS — Z8249 Family history of ischemic heart disease and other diseases of the circulatory system: Secondary | ICD-10-CM | POA: Diagnosis not present

## 2018-10-20 DIAGNOSIS — Z7951 Long term (current) use of inhaled steroids: Secondary | ICD-10-CM | POA: Diagnosis not present

## 2018-10-20 DIAGNOSIS — Z825 Family history of asthma and other chronic lower respiratory diseases: Secondary | ICD-10-CM | POA: Diagnosis not present

## 2018-10-20 DIAGNOSIS — J439 Emphysema, unspecified: Secondary | ICD-10-CM | POA: Diagnosis not present

## 2018-10-20 DIAGNOSIS — I10 Essential (primary) hypertension: Secondary | ICD-10-CM | POA: Diagnosis present

## 2018-10-20 LAB — URINALYSIS, COMPLETE (UACMP) WITH MICROSCOPIC
Bacteria, UA: NONE SEEN
Bilirubin Urine: NEGATIVE
Glucose, UA: 50 mg/dL — AB
Ketones, ur: NEGATIVE mg/dL
Leukocytes,Ua: NEGATIVE
Nitrite: NEGATIVE
Protein, ur: NEGATIVE mg/dL
Specific Gravity, Urine: 1.013 (ref 1.005–1.030)
pH: 6 (ref 5.0–8.0)

## 2018-10-20 LAB — BASIC METABOLIC PANEL
Anion gap: 8 (ref 5–15)
BUN: 13 mg/dL (ref 6–20)
CO2: 23 mmol/L (ref 22–32)
Calcium: 8.9 mg/dL (ref 8.9–10.3)
Chloride: 108 mmol/L (ref 98–111)
Creatinine, Ser: 0.54 mg/dL (ref 0.44–1.00)
GFR calc Af Amer: >60 mL/min (ref >60)
GFR calc non Af Amer: >60 mL/min (ref >60)
Glucose, Bld: 158 mg/dL — ABNORMAL HIGH (ref 70–99)
Potassium: 3.9 mmol/L (ref 3.5–5.1)
Sodium: 139 mmol/L (ref 135–145)

## 2018-10-20 LAB — TROPONIN I: Troponin I: <0.03 ng/mL (ref <0.03)

## 2018-10-20 LAB — PROCALCITONIN: Procalcitonin: <0.1 ng/mL

## 2018-10-20 LAB — PHOSPHORUS: Phosphorus: 3.4 mg/dL (ref 2.5–4.6)

## 2018-10-20 LAB — GLUCOSE, CAPILLARY: Glucose-Capillary: 197 mg/dL — ABNORMAL HIGH (ref 70–99)

## 2018-10-20 LAB — MAGNESIUM: Magnesium: 2.1 mg/dL (ref 1.7–2.4)

## 2018-10-20 LAB — MRSA PCR SCREENING: MRSA by PCR: NEGATIVE

## 2018-10-20 MED ORDER — ACETAMINOPHEN 325 MG PO TABS: 650.0000 mg | ORAL_TABLET | ORAL | Status: DC | PRN

## 2018-10-20 MED ORDER — HEPARIN BOLUS VIA INFUSION
3500.0000 [IU] | Freq: Once | INTRAVENOUS | Status: DC
  Filled 2018-10-20: qty 3500

## 2018-10-20 MED ORDER — METHYLPREDNISOLONE SODIUM SUCC 40 MG IJ SOLR
40.0000 mg | Freq: Two times a day (BID) | INTRAMUSCULAR | Status: DC
  Administered 2018-10-20 – 2018-10-21 (×2): 40 mg via INTRAVENOUS
  Filled 2018-10-20 (×2): qty 1

## 2018-10-20 MED ORDER — ASPIRIN 81 MG PO CHEW
81.0000 mg | CHEWABLE_TABLET | Freq: Every day | ORAL | Status: DC
  Administered 2018-10-20 – 2018-10-23 (×4): 81 mg
  Filled 2018-10-20 (×4): qty 1

## 2018-10-20 MED ORDER — ENOXAPARIN SODIUM 60 MG/0.6ML ~~LOC~~ SOLN
60.0000 mg | Freq: Two times a day (BID) | SUBCUTANEOUS | Status: DC
  Administered 2018-10-20: 60 mg via SUBCUTANEOUS
  Filled 2018-10-20 (×3): qty 0.6

## 2018-10-20 MED ORDER — HEPARIN (PORCINE) 25000 UT/250ML-% IV SOLN: 900.0000 [IU]/h | INTRAVENOUS | Status: DC

## 2018-10-20 MED ORDER — FAMOTIDINE IN NACL 20-0.9 MG/50ML-% IV SOLN
20.0000 mg | Freq: Two times a day (BID) | INTRAVENOUS | Status: DC
  Administered 2018-10-20 – 2018-10-21 (×4): 20 mg via INTRAVENOUS
  Filled 2018-10-20 (×4): qty 50

## 2018-10-20 MED ORDER — ENOXAPARIN SODIUM 40 MG/0.4ML ~~LOC~~ SOLN: 40.0000 mg | SUBCUTANEOUS | Status: DC

## 2018-10-20 MED ORDER — OXYCODONE-ACETAMINOPHEN 5-325 MG PO TABS
1.0000 | ORAL_TABLET | Freq: Three times a day (TID) | ORAL | Status: DC | PRN
  Administered 2018-10-20 – 2018-10-23 (×6): 1 via ORAL
  Filled 2018-10-20 (×6): qty 1

## 2018-10-20 MED ORDER — AZITHROMYCIN 200 MG/5ML PO SUSR
500.0000 mg | Freq: Every day | ORAL | Status: DC
  Administered 2018-10-21 – 2018-10-23 (×3): 500 mg
  Filled 2018-10-20 (×3): qty 12.5

## 2018-10-20 MED ORDER — MORPHINE SULFATE (PF) 4 MG/ML IV SOLN
4.0000 mg | Freq: Once | INTRAVENOUS | Status: AC
  Administered 2018-10-20: 01:00:00 4 mg via INTRAVENOUS
  Filled 2018-10-20: qty 1

## 2018-10-20 MED ORDER — BUDESONIDE 0.25 MG/2ML IN SUSP
0.2500 mg | Freq: Two times a day (BID) | RESPIRATORY_TRACT | Status: DC
  Administered 2018-10-20 – 2018-10-23 (×6): 0.25 mg via RESPIRATORY_TRACT
  Filled 2018-10-20 (×6): qty 2

## 2018-10-20 MED ORDER — IPRATROPIUM-ALBUTEROL 0.5-2.5 (3) MG/3ML IN SOLN
3.0000 mL | Freq: Four times a day (QID) | RESPIRATORY_TRACT | Status: DC
  Administered 2018-10-20 – 2018-10-23 (×12): 3 mL via RESPIRATORY_TRACT
  Filled 2018-10-20 (×12): qty 3

## 2018-10-20 MED ORDER — PAROXETINE HCL 20 MG PO TABS
20.0000 mg | ORAL_TABLET | Freq: Every day | ORAL | Status: DC
  Administered 2018-10-20 – 2018-10-23 (×4): 20 mg
  Filled 2018-10-20 (×4): qty 1

## 2018-10-20 MED ORDER — MIRTAZAPINE 15 MG PO TABS
45.0000 mg | ORAL_TABLET | Freq: Every day | ORAL | Status: DC
  Filled 2018-10-20: qty 3

## 2018-10-20 MED ORDER — ONDANSETRON HCL 4 MG/2ML IJ SOLN
4.0000 mg | Freq: Four times a day (QID) | INTRAMUSCULAR | Status: DC | PRN
  Administered 2018-10-23: 10:00:00 4 mg via INTRAVENOUS
  Filled 2018-10-20: qty 2

## 2018-10-20 MED ORDER — ENOXAPARIN SODIUM 40 MG/0.4ML ~~LOC~~ SOLN
40.0000 mg | SUBCUTANEOUS | Status: DC
  Administered 2018-10-20 – 2018-10-22 (×3): 40 mg via SUBCUTANEOUS
  Filled 2018-10-20 (×3): qty 0.4

## 2018-10-20 MED ORDER — CHLORHEXIDINE GLUCONATE 0.12% ORAL RINSE (MEDLINE KIT)
15.0000 mL | Freq: Two times a day (BID) | OROMUCOSAL | Status: DC
  Administered 2018-10-20 – 2018-10-22 (×5): 15 mL via OROMUCOSAL

## 2018-10-20 MED ORDER — ORAL CARE MOUTH RINSE
15.0000 mL | OROMUCOSAL | Status: DC
  Administered 2018-10-20 – 2018-10-23 (×21): 15 mL via OROMUCOSAL

## 2018-10-20 MED ORDER — IPRATROPIUM-ALBUTEROL 0.5-2.5 (3) MG/3ML IN SOLN
3.0000 mL | RESPIRATORY_TRACT | Status: DC | PRN
  Administered 2018-10-22 – 2018-10-23 (×2): 3 mL via RESPIRATORY_TRACT
  Filled 2018-10-20 (×3): qty 3

## 2018-10-20 MED ORDER — CARVEDILOL 3.125 MG PO TABS
3.1250 mg | ORAL_TABLET | Freq: Two times a day (BID) | ORAL | Status: DC
  Administered 2018-10-20 – 2018-10-21 (×2): 3.125 mg via ORAL
  Filled 2018-10-20 (×2): qty 1

## 2018-10-20 MED ORDER — CLONAZEPAM 0.5 MG PO TABS
0.5000 mg | ORAL_TABLET | Freq: Two times a day (BID) | ORAL | Status: DC
  Administered 2018-10-20 – 2018-10-21 (×3): 0.5 mg via ORAL
  Filled 2018-10-20 (×3): qty 1

## 2018-10-20 MED ORDER — METHYLPREDNISOLONE SODIUM SUCC 125 MG IJ SOLR
60.0000 mg | Freq: Four times a day (QID) | INTRAMUSCULAR | Status: DC
  Administered 2018-10-20 (×2): 60 mg via INTRAVENOUS
  Filled 2018-10-20 (×2): qty 2

## 2018-10-20 MED ORDER — IOHEXOL 350 MG/ML SOLN
75.0000 mL | Freq: Once | INTRAVENOUS | Status: AC | PRN
  Administered 2018-10-20: 11:00:00 75 mL via INTRAVENOUS

## 2018-10-20 MED ORDER — SODIUM CHLORIDE 0.9 % IV SOLN
500.0000 mg | INTRAVENOUS | Status: DC
  Administered 2018-10-20: 05:00:00 500 mg via INTRAVENOUS
  Filled 2018-10-20: qty 500

## 2018-10-20 MED ORDER — QUETIAPINE FUMARATE 25 MG PO TABS
25.0000 mg | ORAL_TABLET | Freq: Every day | ORAL | Status: DC
  Administered 2018-10-20 – 2018-10-22 (×3): 25 mg
  Filled 2018-10-20 (×3): qty 1

## 2018-10-20 NOTE — Progress Notes (Signed)
Coordinated and integrated plan for CT at [facility].

## 2018-10-20 NOTE — Progress Notes (Signed)
[facility]Link notified of patients request for sleeping medication, pain medication, and a diet order. Patient states that she has a feeding tube but has not used it for over a month. Patient states that she eats a regular diet. Acknowledged. Awaiting new orders.

## 2018-10-20 NOTE — Progress Notes (Signed)
Please note, patient is currently followed by outpatient Palliative at home. CMRN Doran Clay made aware. Flo Shanks R=BSN, RN, [place] [phone]

## 2018-10-20 NOTE — Progress Notes (Signed)
3 attempts made to return call to patients sister, Freda Munro.  2 attempts at [phone] with no answer and 1 attempt at [phone] and I was told that Freda Munro wasn't there. Spoke with patient to let her know that I did attempt to contact her and would try once again before the end of the shift. Patient verbalized understanding.

## 2018-10-20 NOTE — H&P (Signed)
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 Allergy   . Anxiety   . Asthma   . CHF (congestive heart failure) ([place])   . Cocaine abuse ([place])   . COPD (chronic obstructive pulmonary disease) ([place])   . Coronary artery disease   . Emphysema/COPD ([place])   . Hepatitis C   . High cholesterol   . Hypertension   . Pneumothorax   . Tobacco abuse      PAST SURGICAL HISTORY:   Past Surgical History:  Procedure Laterality Date  . CARDIAC CATHETERIZATION    . CHEST TUBE INSERTION    . IR GASTROSTOMY TUBE MOD SED  10/31/2017  . TRACHEOSTOMY TUBE PLACEMENT N/A 10/17/2017   Procedure: TRACHEOSTOMY;  Surgeon: Carloyn Manner, MD;  Location: [facility] ORS;  Service: ENT;  Laterality: N/A;     SOCIAL HISTORY:   Social History   Tobacco Use  . Smoking status: Current Every Day Smoker    Packs/day: 0.10    Years: 41.00    Pack years: 4.10    Types: Cigarettes  . Smokeless tobacco: Never Used  Substance Use Topics  . Alcohol use: No    Frequency: Never     FAMILY HISTORY:   Family History  Problem Relation Age of Onset  . Lung cancer Mother    . Asthma Mother   . CVA Father   . CAD Father   . Stroke Father   . Breast cancer Maternal Aunt 67  . COPD Sister      DRUG ALLERGIES:  No Known Allergies  MEDICATIONS AT HOME:   Prior to Admission medications   Medication Sig Start Date End Date Taking? Authorizing Provider  AMBULATORY NON FORMULARY MEDICATION Medication Name: Pulse oximeter DME:[facility] DX J44.9....J96.21 01/08/18   Flora Lipps, MD  aspirin 81 MG chewable tablet Chew 81 mg by mouth daily.    [provider]  budesonide (PULMICORT) 0.25 MG/2ML nebulizer solution Take 2 mLs (0.25 mg total) by nebulization 2 (two) times daily. 12/31/17   Conforti, John, DO  carvedilol (COREG) 3.125 MG tablet Take 3.125 mg by mouth 2 (two) times daily with a meal.    [provider]  cetirizine (ZYRTEC) 10 MG tablet Take 10 mg by mouth daily.    [provider]  clonazePAM (KLONOPIN) 0.5 MG tablet Place 1 tablet (0.5 mg total) into feeding tube daily. Patient taking differently: Take 1 mg by mouth 2 (two) times daily. Takes 1 tablet bid and prn for anxiety 01/07/18   Flora Lipps, MD  enalapril (VASOTEC) 5 MG tablet Take 5 mg by mouth daily.    [provider]  famotidine (PEPCID) 20 MG tablet  Place 1 tablet (20 mg total) into feeding tube daily. 12/31/17   Conforti, John, DO  fentaNYL (DURAGESIC - DOSED MCG/HR) 25 MCG/HR patch Place 1 patch (25 mcg total) onto the skin every 3 (three) days. 01/07/18   Flora Lipps, MD  fluticasone (FLONASE) 50 MCG/ACT nasal spray Place 2 sprays into both nostrils daily.    [provider]  ipratropium-albuterol (DUONEB) 0.5-2.5 (3) MG/3ML SOLN Take 3 mLs by nebulization every 6 (six) hours. 12/31/17   Conforti, John, DO  levalbuterol (XOPENEX) 1.25 MG/3ML nebulizer solution Take 1.25 mg by nebulization every 4 (four) hours as needed for wheezing.    [provider]  mirtazapine (REMERON) 45 MG tablet Take 45 mg by mouth at bedtime.    [provider]   oxyCODONE-acetaminophen (PERCOCET/ROXICET) 5-325 MG tablet Place 1 tablet into feeding tube every 8 (eight) hours as needed for severe pain. 01/07/18   Flora Lipps, MD  PARoxetine (PAXIL) 20 MG tablet Place 1 tablet (20 mg total) into feeding tube at bedtime. 12/31/17   Conforti, John, DO  QUEtiapine (SEROQUEL) 25 MG tablet Place 1 tablet (25 mg total) into feeding tube at bedtime. 12/31/17   Conforti, John, DO  senna (SENOKOT) 8.6 MG TABS tablet Take 1 tablet by mouth daily as needed for mild constipation.    [provider]  simvastatin (ZOCOR) 20 MG tablet Take 40 mg by mouth daily at 6 PM.    [provider]    REVIEW OF SYSTEMS:  Review of Systems  Constitutional: Negative for chills, fever, malaise/fatigue and weight loss.  HENT: Negative for ear pain, hearing loss and tinnitus.   Eyes: Negative for blurred vision, double vision, pain and redness.  Respiratory: Positive for shortness of breath. Negative for cough and hemoptysis.   Cardiovascular: Negative for chest pain, palpitations, orthopnea and leg swelling.  Gastrointestinal: Negative for abdominal pain, constipation, diarrhea, nausea and vomiting.  Genitourinary: Negative for dysuria, frequency and hematuria.  Musculoskeletal: Negative for back pain, joint pain and neck pain.  Skin:       No acne, rash, or lesions  Neurological: Negative for dizziness, tremors, focal weakness and weakness.  Endo/Heme/Allergies: Negative for polydipsia. Does not bruise/bleed easily.  Psychiatric/Behavioral: Negative for depression. The patient is not nervous/anxious and does not have insomnia.      BP 126/75   Pulse 77   Temp 98.2 F (36.8 C) (Oral)   Resp (!) 22   Ht 5\' 1"  (1.549 m)   Wt 59 kg   LMP 03/06/2005 (Approximate)   SpO2 98%   BMI 24.56 kg/m   CBC    Component Value Date/Time   WBC 7.6 10/19/2018 2306   RBC 4.17 10/19/2018 2306   HGB 11.2 (L) 10/19/2018 2306   HGB 12.4 07/11/2014 0434   HCT 36.7  10/19/2018 2306   HCT 38.9 07/11/2014 0434   PLT 229 10/19/2018 2306   PLT 251 07/11/2014 0434   MCV 88.0 10/19/2018 2306   MCV 87 07/11/2014 0434   MCH 26.9 10/19/2018 2306   MCHC 30.5 10/19/2018 2306   RDW 13.3 10/19/2018 2306   RDW 13.9 07/11/2014 0434   LYMPHSABS 1.1 10/19/2018 2306   LYMPHSABS 0.9 (L) 07/11/2014 0434   MONOABS 0.4 10/19/2018 2306   MONOABS 1.0 (H) 07/11/2014 0434   EOSABS 0.1 10/19/2018 2306   EOSABS 0.0 07/11/2014 0434   BASOSABS 0.0 10/19/2018 2306   BASOSABS 0.0 07/11/2014 0434   BMP Latest Ref Rng & Units 10/19/2018 09/20/2018 09/19/2018  Glucose 70 - 99 mg/dL 193(H) 101(H) 106(H)  BUN 6 - 20 mg/dL 15 24(H) 27(H)  Creatinine 0.44 - 1.00 mg/dL 0.67 0.60 0.68  Sodium 135 - 145 mmol/L 142 140 141  Potassium 3.5 - 5.1 mmol/L 3.3(L) 3.3(L) 3.6  Chloride 98 - 111 mmol/L 111 105 111  CO2 22 - 32 mmol/L 24 26 24   Calcium 8.9 - 10.3 mg/dL 8.3(L) 8.8(L) 9.0     PHYSICAL EXAMINATION:  Physical Exam  Vitals reviewed. Constitutional: She is oriented to person, place, and time. She appears well-developed and well-nourished. No distress.  HENT:  Head: Normocephalic and atraumatic.  Mouth/Throat: Oropharynx is clear and moist.  Eyes: Pupils are equal, round, and reactive to light. Conjunctivae and EOM are normal. No scleral icterus.  Neck: Normal range of motion. Neck supple. No JVD present. No thyromegaly present.  Cardiovascular: Regular rhythm and intact distal pulses. Exam reveals no gallop and no friction rub.  No murmur heard. Borderline tachycardic  Respiratory: She is in respiratory distress. She has wheezes (Mild). She has no rales.  Chronic trach on ventilator  GI: Soft. Bowel sounds are normal. She exhibits no distension. There is no abdominal tenderness.  Musculoskeletal: Normal range of motion.        General: No edema.     Comments: No arthritis, no gout  Lymphadenopathy:    She has no cervical adenopathy.  Neurological: She is alert and  oriented to person, place, and time. No cranial nerve deficit.  No dysarthria, no aphasia  Skin: Skin is warm and dry. No rash noted. No erythema.  Psychiatric: She has a normal mood and affect. Her behavior is normal. Judgment and thought content normal.    LABORATORY PANEL:   CBC Recent Labs  Lab 10/19/18 2306  WBC 7.6  HGB 11.2*  HCT 36.7  PLT 229   ------------------------------------------------------------------------------------------------------------------  Chemistries  Recent Labs  Lab 10/19/18 2306  NA 142  K 3.3*  CL 111  CO2 24  GLUCOSE 193*  BUN 15  CREATININE 0.67  CALCIUM 8.3*  AST 23  ALT 17  ALKPHOS 132*  BILITOT 0.3   ------------------------------------------------------------------------------------------------------------------  Cardiac Enzymes Recent Labs  Lab 10/19/18 2306  TROPONINI <0.03   ------------------------------------------------------------------------------------------------------------------  RADIOLOGY:  Dg Chest Port 1 View  Result Date: 10/19/2018 CLINICAL DATA:  58 y/o F; increasing shortness of breath. Under treatment for pneumonia. EXAM: PORTABLE CHEST 1 VIEW COMPARISON:  09/18/2018 chest radiograph FINDINGS: Stable normal cardiac silhouette. Improved aeration at the left lung base. No new focal consolidation. No pleural effusion or pneumothorax. Tracheostomy tube projects over the tracheal air column. Bones are unremarkable. IMPRESSION: Improved aeration at the left lung base. No new focal consolidation. Electronically Signed   By: Kristine Garbe M.D.   On: 10/19/2018 22:17    EKG:   Orders placed or performed during the hospital encounter of 10/19/18  . EKG 12-Lead  . EKG 12-Lead  . EKG 12-Lead  . EKG 12-Lead    IMPRESSION AND PLAN:   Acute on chronic respiratory failure with hypoxia most likely from COPD exacerbation patient also with acute diastolic heart failure There was a high suspicion of PE at  this time and patient was started on heparin empirically   SEVERE COPD EXACERBATION -continue IV steroids as prescribed -continue NEB THERAPY as prescribed -morphine as needed -wean fio2 as needed and tolerated Obtain CT of the chest rule out PE  ACUTE diaSTOLIC CARDIAC FAILURE-  -oxygen as needed -Lasix as tolerated -follow up cardiac  enzymes as indicated    ELECTROLYTES -follow labs as needed -replace as needed -pharmacy consultation and following    DVT/GI PRX ordered TRANSFUSIONS AS NEEDED MONITOR FSBS ASSESS the need for LABS as needed   MEDICATION ADJUSTMENTS/LABS AND TESTS ORDERED:CT chest  CURRENT MEDICATIONS REVIEWED AT LENGTH WITH PATIENT TODAY     Corrin Parker, M.D.  [facility]  Medical Director [place] Director [facility] Cardio-Pulmonary Department

## 2018-10-20 NOTE — Progress Notes (Signed)
[place] [facility] [place] NAME: Michelle [name]    MR#:  [id number]  DATE OF BIRTH:  06-12-1960  SUBJECTIVE:  CHIEF COMPLAINT: Patient shortness of breath significantly improved.  Intermittent episodes of cough Denies smoking  REVIEW OF SYSTEMS:  CONSTITUTIONAL: No fever, fatigue or weakness.  EYES: No blurred or double vision.  EARS, NOSE, AND THROAT: No tinnitus or ear pain.  Chronic trach and ventilator RESPIRATORY: Improving cough, shortness of breath, denies wheezing or hemoptysis.  CARDIOVASCULAR: No chest pain, orthopnea, edema.  GASTROINTESTINAL: No nausea, vomiting, diarrhea or abdominal pain.  GENITOURINARY: No dysuria, hematuria.  ENDOCRINE: No polyuria, nocturia,  HEMATOLOGY: No anemia, easy bruising or bleeding SKIN: No rash or lesion. MUSCULOSKELETAL: No joint pain or arthritis.   NEUROLOGIC: No tingling, numbness, weakness.  PSYCHIATRY: No anxiety or depression.   DRUG ALLERGIES:  No Known Allergies  VITALS:  Blood pressure (!) 198/107, pulse 84, temperature 97.6 F (36.4 C), temperature source Oral, resp. rate 17, height 5\' 1"  (1.549 m), weight 59 kg, last menstrual period 03/06/2005, SpO2 100 %.  PHYSICAL EXAMINATION:  GENERAL:  58 y.o.-year-old patient lying in the bed with no acute distress.  EYES: Pupils equal, round, reactive to light and accommodation. No scleral icterus. Extraocular muscles intact.  HEENT: Head atraumatic, normocephalic. Oropharynx and nasopharynx clear.  NECK:  Supple, no jugular venous distention. No thyroid enlargement, no tenderness.  Chronic tracheostomy with ventilator LUNGS: Minimal breath sounds bilaterally, no wheezing, rales,rhonchi or crepitation. No use of accessory muscles of respiration.  CARDIOVASCULAR: S1, S2 normal. No murmurs, rubs, or gallops.  ABDOMEN: Soft, nontender, nondistended. Bowel sounds present. No organomegaly or mass.  EXTREMITIES: No pedal edema, cyanosis, or  clubbing.  NEUROLOGIC: Awake alert and oriented x3 sensation intact. Gait not checked.  PSYCHIATRIC: The patient is alert and oriented x 3.  SKIN: No obvious rash, lesion, or ulcer.    LABORATORY PANEL:   CBC Recent Labs  Lab 10/19/18 2306  WBC 7.6  HGB 11.2*  HCT 36.7  PLT 229   ------------------------------------------------------------------------------------------------------------------  Chemistries  Recent Labs  Lab 10/19/18 2306 10/20/18 1107  NA 142 139  K 3.3* 3.9  CL 111 108  CO2 24 23  GLUCOSE 193* 158*  BUN 15 13  CREATININE 0.67 0.54  CALCIUM 8.3* 8.9  MG  --  2.1  AST 23  --   ALT 17  --   ALKPHOS 132*  --   BILITOT 0.3  --    ------------------------------------------------------------------------------------------------------------------  Cardiac Enzymes Recent Labs  Lab 10/19/18 2306  TROPONINI <0.03   ------------------------------------------------------------------------------------------------------------------  RADIOLOGY:  Ct Angio Chest Pe W Or Wo Contrast  Result Date: 10/20/2018 CLINICAL DATA:  Rule out pulmonary embolism. Increased short of breath with hypoxia EXAM: CT ANGIOGRAPHY CHEST WITH CONTRAST TECHNIQUE: Multidetector CT imaging of the chest was performed using the standard protocol during bolus administration of intravenous contrast. Multiplanar CT image reconstructions and MIPs were obtained to evaluate the vascular anatomy. CONTRAST:  <See Chart> 17 mL OMNIPAQUE IOHEXOL 350 MG/ML SOLN COMPARISON:  Chest x-ray 10/19/2018 FINDINGS: Cardiovascular: Negative for pulmonary embolism. Pulmonary arteries normal in caliber. Atherosclerotic aorta without aneurysm or dissection. Diffuse coronary calcification. Heart size normal. No pericardial effusion. Mediastinum/Nodes: Negative for mass or adenopathy. Lungs/Pleura: Tracheostomy in good position. COPD with emphysema. Bibasilar atelectasis/scarring. Negative for pneumonia or pleural  effusion. Upper Abdomen: Negative Musculoskeletal: Negative Review of the MIP images confirms the above findings. IMPRESSION: 1. Negative for pulmonary embolism. 2. Mild  bibasilar atelectasis/scarring 3. COPD with emphysema.  Bibasilar atelectasis/scarring Aortic Atherosclerosis (ICD10-I70.0) and Emphysema (ICD10-J43.9). Electronically Signed   By: Franchot Gallo M.D.   On: 10/20/2018 11:11   Dg Chest Port 1 View  Result Date: 10/19/2018 CLINICAL DATA:  58 y/o F; increasing shortness of breath. Under treatment for pneumonia. EXAM: PORTABLE CHEST 1 VIEW COMPARISON:  09/18/2018 chest radiograph FINDINGS: Stable normal cardiac silhouette. Improved aeration at the left lung base. No new focal consolidation. No pleural effusion or pneumothorax. Tracheostomy tube projects over the tracheal air column. Bones are unremarkable. IMPRESSION: Improved aeration at the left lung base. No new focal consolidation. Electronically Signed   By: Kristine Garbe M.D.   On: 10/19/2018 22:17    EKG:   Orders placed or performed during the hospital encounter of 10/19/18  . EKG 12-Lead  . EKG 12-Lead  . EKG 12-Lead  . EKG 12-Lead    ASSESSMENT AND PLAN:   #Acute on chronic respiratory failure with hypoxia-secondary to COPD exacerbation with history of end-stage COPD and acute on chronic diastolic CHF  clinically feeling better chronic vent dependent with chronic tracheostomy Normal procalcitonin and normal CBC not considering antibiotics Chest x-ray no new opacity IV steroids and bronchodilator treatments with nebulizer Discussed with intensivist   #Acute on chronic diastolic CHF Oxygen as needed IV Lasix Monitor daily weights, intake and output CTA chest negative for pulmonary embolism but revealed COPD with emphysema Echocardiogram in 12/2017 has revealed a ventricular ejection fraction 60 to 65%   #Hypertensive urgency Clinically improving with antihypertensives will monitor and titrate  medications as needed  #Coronary artery disease Currently asymptomatic continue close monitoring of the patient.  #Tobacco abuse disorder counseled patient to quit smoking for 5 minutes.  She verbalized understanding, nicotine patch offered     All the records are reviewed and case discussed with Care Management/Social Workerr. Management plans discussed with the patient, she is in agreement CODE STATUS: fc  TOTAL TIME TAKING CARE OF THIS PATIENT: 35 minutes.   POSSIBLE D/C IN 2-3 DAYS, DEPENDING ON CLINICAL CONDITION.  Note: This dictation was prepared with Dragon dictation along with smaller phrase technology. Any transcriptional errors that result from this process are unintentional.   Nicholes Mango M.D on 10/20/2018 at 12:39 PM  Between 7am to 6pm - Pager - [phone] After 6pm go to [email or web address] - password [email or web address]  [facility]  Office  [phone]  CC: Primary care physician; Frazier Richards, MD

## 2018-10-20 NOTE — TOC Initial Note (Signed)
Transition of Care [facility]) - Initial/Assessment Note    Patient Details  Name: Michelle [name] MRN: [id number] Date of Birth: August 21, 1960  Transition of Care [facility]) CM/SW Contact:    Michelle Hutching, RN Phone Number: 10/20/2018, 12:44 PM  Clinical Narrative:                 Patient admitted with shortness of breath and chronic respiratory failure.  Patient is on home vent here in the ICU.  Patient has chronic trach and peg, patient is able to speak and tell RNCM that she has home health services with [facility], home health care is pretty much 24/7, patient lives with husband and daughter they provide her transportation.  Patient has DME, hospital bed, wheelchair and RW.  Pharmacy with [facility].  Expected discharge tomorrow from ICU- speech eval ordered for today, patient has been requesting food.   RNCM will cont to follow.  [name] health contacted that patient is admitted.   Expected Discharge Plan: [place] Barriers to Discharge: Continued Medical Work up   Patient Goals and CMS Choice Patient states their goals for this hospitalization and ongoing recovery are:: Return home with home health services CMS Medicare.gov Compare Post Acute Care list provided to:: (Open with [facility] through [facility])    Expected Discharge Plan and Services Expected Discharge Plan: [place]   Discharge Planning Services: CM Consult   Living arrangements for the past 2 months: Single Family Home                   DME Agency: [place]: [place] Date HH Agency Contacted: 10/20/18 Time HH Agency Contacted: [phone]    Prior Living Arrangements/Services Living arrangements for the past 2 months: Single Family Home Lives with:: Adult Children, Spouse Patient language and need for interpreter reviewed:: No Do you feel safe going back to the place where you live?: Yes      Need for Family Participation in Patient Care: Yes  (Comment) Care giver support system in place?: Yes (comment)(good family support from husband and daughter) Current home services: DME, Homehealth aide, Home RN, Housekeeping(Hospital bed, wheelchair, RW) Criminal Activity/Legal Involvement Pertinent to Current Situation/Hospitalization: No - Comment as needed  Activities of Daily Living      Permission Sought/Granted Permission sought to share information with : Other (comment) Permission granted to share information with : Yes, Verbal Permission Granted     Permission granted to share info w AGENCY: [facility]        Emotional Assessment Appearance:: Appears stated age Attitude/Demeanor/Rapport: Engaged Affect (typically observed): Accepting Orientation: : Oriented to Self, Oriented to Place, Oriented to  Time, Oriented to Situation Alcohol / Substance Use: Not Applicable Psych Involvement: No (comment)  Admission diagnosis:  Shortness of breath [R06.02] Acute on chronic respiratory failure, unspecified whether with hypoxia or hypercapnia ([place]) [J96.20] Patient Active Problem List   Diagnosis Date Noted  . Chronic respiratory failure requiring continuous mechanical ventilation through tracheostomy ([place]) 10/20/2018  . CAD (coronary artery disease) 10/20/2018  . HTN (hypertension) 10/20/2018  . Chronic diastolic CHF (congestive heart failure) ([place]) 10/20/2018  . Pneumonia 09/17/2018  . Panic disorder 10/29/2017  . Altered mental status   . Pressure injury of skin 10/23/2017  . Acute on chronic respiratory failure with hypoxia and hypercapnia ([place]) 06/29/2017  . Acute on chronic respiratory failure with hypoxia ([employer]) 02/23/2017  . Protein-calorie  malnutrition, severe 05/23/2016  . Sepsis ([place]) 05/22/2016  . Malnutrition of moderate degree ([place]) 10/25/2014  . COPD exacerbation ([place]) 10/24/2014  . Tobacco abuse 10/24/2014   PCP:  Frazier Richards, MD Pharmacy:   [place], [place] [address] [place] [address] Phone: [phone] Fax: [phone]     Social Determinants of Health (SDOH) Interventions    Readmission Risk Interventions Readmission Risk Prevention Plan 09/19/2018  Transportation Screening Complete  PCP or Specialist Appt within 5-7 Days Complete  Home Care Screening Complete  Medication Review (RN CM) Complete  Some recent data might be hidden

## 2018-10-20 NOTE — ED Notes (Signed)
ED TO INPATIENT HANDOFF REPORT  ED Nurse Name and Phone #: Lelon Frohlich [phone]  S Name/Age/Gender Michelle [name] 58 y.o. female Room/Bed: ED01A/ED01A  Code Status   Code Status: Prior  Home/SNF/Other Home Patient oriented to: self, place, time and situation Is this baseline? Yes   Triage Complete: Triage complete  Chief Complaint SOB  Triage Note EMS pt to rm 1 from home with report of dc from hospital 1 month ago with pneumonia. On antibotics. Over the last 5 hours having increasing shortness of breath. Pt has trach and is on home vent.    Allergies No Known Allergies  Level of Care/Admitting Diagnosis ED Disposition    ED Disposition Condition [facility] Area: [place] [[id number]]  Level of Care: ICU [6]  Covid Evaluation: N/A  Diagnosis: Acute on chronic respiratory failure with hypoxia [facility]) [[id number]]  Admitting Physician: Lance Coon [[id number]]  Attending Physician: Jannifer Franklin, DAVID [phone]  Estimated length of stay: past midnight tomorrow  Certification:: I certify this patient will need inpatient services for at least 2 midnights  PT Class (Do Not Modify): Inpatient [101]  PT Acc Code (Do Not Modify): Private [1]       B Medical/Surgery History Past Medical History:  Diagnosis Date  . Allergy   . Anxiety   . Asthma   . CHF (congestive heart failure) ([place])   . Cocaine abuse ([place])   . COPD (chronic obstructive pulmonary disease) ([place])   . Coronary artery disease   . Emphysema/COPD ([place])   . Hepatitis C   . High cholesterol   . Hypertension   . Pneumothorax   . Tobacco abuse    Past Surgical History:  Procedure Laterality Date  . CARDIAC CATHETERIZATION    . CHEST TUBE INSERTION    . IR GASTROSTOMY TUBE MOD SED  10/31/2017  . TRACHEOSTOMY TUBE PLACEMENT N/A 10/17/2017   Procedure: TRACHEOSTOMY;  Surgeon: Carloyn Manner, MD;  Location: [facility] ORS;  Service: ENT;  Laterality: N/A;     A IV  Location/Drains/Wounds Patient Lines/Drains/Airways Status   Active Line/Drains/Airways    Name:   Placement date:   Placement time:   Site:   Days:   Peripheral IV 10/19/18 Left Hand   10/19/18    2226    Hand   1   Midline Single Lumen 10/19/18 Midline Left Basilic 8 cm 0 cm   86/57/84    6962    Basilic   1   Gastrostomy/Enterostomy Gastrostomy 20 Fr. LUQ   10/31/17    1448    LUQ   354   Urethral Catheter Gentry Fitz, RN Latex 16 Fr.   09/17/18    0143    Latex   33   Urethral Catheter Ann, RN Latex 16 Fr.   10/20/18    0144    Latex   less than 1   Tracheostomy Shiley 6 mm Cuffed   09/17/18    1216    6 mm   33          Intake/Output Last 24 hours  Intake/Output Summary (Last 24 hours) at 10/20/2018 0151 Last data filed at 10/20/2018 0144 Gross per 24 hour  Intake 346.72 ml  Output -  Net 346.72 ml    Labs/Imaging Results for orders placed or performed during the hospital encounter of 10/19/18 (from the past 48 hour(s))  SARS Coronavirus 2 (CEPHEID- Performed in [place] hospital lab), Hosp Order     Status:  None   Collection Time: 10/19/18 10:00 PM  Result Value Ref Range   SARS Coronavirus 2 NEGATIVE NEGATIVE    Comment: (NOTE) If result is NEGATIVE SARS-CoV-2 target nucleic acids are NOT DETECTED. The SARS-CoV-2 RNA is generally detectable in upper and lower  respiratory specimens during the acute phase of infection. The lowest  concentration of SARS-CoV-2 viral copies this assay can detect is 250  copies / mL. A negative result does not preclude SARS-CoV-2 infection  and should not be used as the sole basis for treatment or other  patient management decisions.  A negative result may occur with  improper specimen collection / handling, submission of specimen other  than nasopharyngeal swab, presence of viral mutation(s) within the  areas targeted by this assay, and inadequate number of viral copies  (<250 copies / mL). A negative result must be combined with clinical   observations, patient history, and epidemiological information. If result is POSITIVE SARS-CoV-2 target nucleic acids are DETECTED. The SARS-CoV-2 RNA is generally detectable in upper and lower  respiratory specimens dur ing the acute phase of infection.  Positive  results are indicative of active infection with SARS-CoV-2.  Clinical  correlation with patient history and other diagnostic information is  necessary to determine patient infection status.  Positive results do  not rule out bacterial infection or co-infection with other viruses. If result is PRESUMPTIVE POSTIVE SARS-CoV-2 nucleic acids MAY BE PRESENT.   A presumptive positive result was obtained on the submitted specimen  and confirmed on repeat testing.  While 2019 novel coronavirus  (SARS-CoV-2) nucleic acids may be present in the submitted sample  additional confirmatory testing may be necessary for epidemiological  and / or clinical management purposes  to differentiate between  SARS-CoV-2 and other Sarbecovirus currently known to infect humans.  If clinically indicated additional testing with an alternate test  methodology [phone]) is advised. The SARS-CoV-2 RNA is generally  detectable in upper and lower respiratory sp ecimens during the acute  phase of infection. The expected result is Negative. Fact Sheet for Patients:  StrictlyIdeas.no Fact Sheet for Healthcare Providers: BankingDealers.co.za This test is not yet approved or cleared by the [place] FDA and has been authorized for detection and/or diagnosis of SARS-CoV-2 by FDA under an Emergency Use Authorization (EUA).  This EUA will remain in effect (meaning this test can be used) for the duration of the COVID-19 declaration under Section 564(b)(1) of the Act, 21 U.S.C. section 360bbb-3(b)(1), unless the authorization is terminated or revoked sooner. Performed at [facility], [address] [place], [place] [address]   Lactic acid, plasma     Status: None   Collection Time: 10/19/18 11:06 PM  Result Value Ref Range   Lactic Acid, Venous 1.6 0.5 - 1.9 mmol/L    Comment: Performed at [facility] [address] [place], [place] [address]  Comprehensive metabolic panel     Status: Abnormal   Collection Time: 10/19/18 11:06 PM  Result Value Ref Range   Sodium 142 135 - 145 mmol/L   Potassium 3.3 (L) 3.5 - 5.1 mmol/L   Chloride 111 98 - 111 mmol/L   CO2 24 22 - 32 mmol/L   Glucose, Bld 193 (H) 70 - 99 mg/dL   BUN 15 6 - 20 mg/dL   Creatinine, Ser 0.67 0.44 - 1.00 mg/dL   Calcium 8.3 (L) 8.9 - 10.3 mg/dL   Total Protein 7.2 6.5 - 8.1 g/dL   Albumin 3.6 3.5 - 5.0  g/dL   AST 23 15 - 41 U/L   ALT 17 0 - 44 U/L   Alkaline Phosphatase 132 (H) 38 - 126 U/L   Total Bilirubin 0.3 0.3 - 1.2 mg/dL   GFR calc non Af Amer >60 >60 mL/min   GFR calc Af Amer >60 >60 mL/min   Anion gap 7 5 - 15    Comment: Performed at [facility], [address] [place], [place] [address]  Lipase, blood     Status: None   Collection Time: 10/19/18 11:06 PM  Result Value Ref Range   Lipase 28 11 - 51 U/L    Comment: Performed at [facility], [address] [place], [place] [address]  CBC WITH DIFFERENTIAL     Status: Abnormal   Collection Time: 10/19/18 11:06 PM  Result Value Ref Range   WBC 7.6 4.0 - 10.5 K/uL   RBC 4.17 3.87 - 5.11 MIL/uL   Hemoglobin 11.2 (L) 12.0 - 15.0 g/dL   HCT 36.7 36.0 - 46.0 %   MCV 88.0 80.0 - 100.0 fL   MCH 26.9 26.0 - 34.0 pg   MCHC 30.5 30.0 - 36.0 g/dL   RDW 13.3 11.5 - 15.5 %   Platelets 229 150 - 400 K/uL   nRBC 0.0 0.0 - 0.2 %   Neutrophils Relative % 78 %   Neutro Abs 5.9 1.7 - 7.7 K/uL   Lymphocytes Relative 14 %   Lymphs Abs 1.1 0.7 - 4.0 K/uL   Monocytes Relative 6 %   Monocytes Absolute 0.4 0.1 - 1.0 K/uL   Eosinophils Relative 1 %   Eosinophils Absolute 0.1 0.0 - 0.5 K/uL   Basophils Relative 0 %   Basophils  Absolute 0.0 0.0 - 0.1 K/uL   Immature Granulocytes 1 %   Abs Immature Granulocytes 0.05 0.00 - 0.07 K/uL    Comment: Performed at [facility], [address] [place], [place] [address]  Procalcitonin     Status: None   Collection Time: 10/19/18 11:06 PM  Result Value Ref Range   Procalcitonin <0.10 ng/mL    Comment:        Interpretation: PCT (Procalcitonin) <= 0.5 ng/mL: Systemic infection (sepsis) is not likely. Local bacterial infection is possible. (NOTE)       Sepsis PCT Algorithm           Lower Respiratory Tract                                      Infection PCT Algorithm    ----------------------------     ----------------------------         PCT < 0.25 ng/mL                PCT < 0.10 ng/mL         Strongly encourage             Strongly discourage   discontinuation of antibiotics    initiation of antibiotics    ----------------------------     -----------------------------       PCT 0.25 - 0.50 ng/mL            PCT 0.10 - 0.25 ng/mL               OR       >80% decrease in PCT            Discourage  initiation of                                            antibiotics      Encourage discontinuation           of antibiotics    ----------------------------     -----------------------------         PCT >= 0.50 ng/mL              PCT 0.26 - 0.50 ng/mL               AND        <80% decrease in PCT             Encourage initiation of                                             antibiotics       Encourage continuation           of antibiotics    ----------------------------     -----------------------------        PCT >= 0.50 ng/mL                  PCT > 0.50 ng/mL               AND         increase in PCT                  Strongly encourage                                      initiation of antibiotics    Strongly encourage escalation           of antibiotics                                     -----------------------------                                            PCT <= 0.25 ng/mL                                                 OR                                        > 80% decrease in PCT                                     Discontinue / Do not initiate  antibiotics Performed at [facility], [address] [place], [place] [address]   APTT     Status: None   Collection Time: 10/19/18 11:06 PM  Result Value Ref Range   aPTT 32 24 - 36 seconds    Comment: Performed at [facility], [address] [place], [place] [address]  Protime-INR     Status: None   Collection Time: 10/19/18 11:06 PM  Result Value Ref Range   Prothrombin Time 12.6 11.4 - 15.2 seconds   INR 1.0 0.8 - 1.2    Comment: (NOTE) INR goal varies based on device and disease states. Performed at [facility], [address] [place], [place] [address]    Dg Chest [place] 1 View  Result Date: 10/19/2018 CLINICAL DATA:  58 y/o F; increasing shortness of breath. Under treatment for pneumonia. EXAM: PORTABLE CHEST 1 VIEW COMPARISON:  09/18/2018 chest radiograph FINDINGS: Stable normal cardiac silhouette. Improved aeration at the left lung base. No new focal consolidation. No pleural effusion or pneumothorax. Tracheostomy tube projects over the tracheal air column. Bones are unremarkable. IMPRESSION: Improved aeration at the left lung base. No new focal consolidation. Electronically Signed   By: Kristine Garbe M.D.   On: 10/19/2018 22:17    Pending Labs Unresulted Labs (From admission, onward)    Start     Ordered   10/20/18 0131  Troponin I - ONCE - STAT  ONCE - STAT,   STAT     10/20/18 0130   10/19/18 2131  Blood Culture (routine x 2)  BLOOD CULTURE X 2,   STAT     10/19/18 2131   10/19/18 2131  Urinalysis, Complete w Microscopic  ONCE - STAT,   STAT     10/19/18 2131   10/19/18 2131  Urine culture  ONCE - STAT,   STAT     10/19/18 2131   Signed and Held  CBC  (enoxaparin (LOVENOX)    CrCl  >/= 30 ml/min)  Once,   R    Comments:  Baseline for enoxaparin therapy IF NOT ALREADY DRAWN.  Notify MD if PLT < 100 K.    Signed and Held   Signed and Held  Creatinine, serum  (enoxaparin (LOVENOX)    CrCl >/= 30 ml/min)  Once,   R    Comments:  Baseline for enoxaparin therapy IF NOT ALREADY DRAWN.    Signed and Held   Signed and Held  Creatinine, serum  (enoxaparin (LOVENOX)    CrCl >/= 30 ml/min)  Weekly,   R    Comments:  while on enoxaparin therapy    Signed and Held          Vitals/Pain Today's Vitals   10/19/18 2300 10/19/18 2310 10/19/18 2330 10/20/18 0000  BP:  (!) 153/93 (!) 143/83 140/87  Pulse: (!) 101 (!) 101 97 98  Resp: (!) 22 (!) 23 20 18   Temp:      TempSrc:      SpO2: 98% 98% 99% 98%  Weight:      Height:      PainSc:        Isolation Precautions No active isolations  Medications Medications  vancomycin (VANCOCIN) IVPB 1000 mg/200 mL premix (0 mg Intravenous Stopped 10/20/18 0144)  ceFEPIme (MAXIPIME) 2 g in sodium chloride 0.9 % 100 mL IVPB (0 g Intravenous Stopped 10/20/18 0000)  magnesium sulfate IVPB 2 g 50 mL (0 g Intravenous Stopped 10/19/18 2329)  morphine 4 MG/ML injection 4 mg (  4 mg Intravenous Given 10/20/18 0120)    Mobility non-ambulatory Moderate fall risk   Focused Assessments Pulmonary Assessment Handoff:  Lung sounds: Bilateral Breath Sounds: Other (Comment)(faint difuse wheexes) O2 Device: Ventilator(trach)        R Recommendations: See Admitting Provider Note  Report given to:   Additional Notes: trach patient on home vent

## 2018-10-20 NOTE — Progress Notes (Signed)
Attempted video call with daughter. She never joined the call after 71mins. I attempted to call the number but it went straight to voicemail. Bedside RN made aware

## 2018-10-20 NOTE — H&P (Signed)
[place] [facility] [place] NAME: Michelle [name]    MR#:  [id number]  DATE OF BIRTH:  [birth year]  DATE OF ADMISSION:  10/19/2018  PRIMARY CARE PHYSICIAN: Frazier Richards, MD   REQUESTING/REFERRING PHYSICIAN: Karma Greaser, MD  CHIEF COMPLAINT:   Chief Complaint  Patient presents with  . Respiratory Distress  . Shortness of Breath    HISTORY OF PRESENT ILLNESS:  Michelle [name]  is a 58 y.o. female who presents with chief complaint as above.  Patient presents the ED with worsening shortness of breath and some hypoxia.  She is a chronic trach patient on chronic ventilator at home.  She had to adjust her vent settings on the way to the hospital due to hypoxia.  Here her work-up is consistent with likely COPD exacerbation versus possible PE.  She was somewhat tachycardic when she arrived.  Her procalcitonin in the ED was negative, white count is normal, chest x-ray does not show any acute infectious etiology.  She was recently mated here and treated for pneumonia, and imaging today actually shows improvement.  Access was obtained in the ED, but subsequently failed and so the patient has not been able to get a CTA chest yet.  Hospitalist called for admission.  PAST MEDICAL HISTORY:   Past Medical History:  Diagnosis Date  . Allergy   . Anxiety   . Asthma   . CHF (congestive heart failure) ([place])   . Cocaine abuse ([place])   . COPD (chronic obstructive pulmonary disease) ([place])   . Coronary artery disease   . Emphysema/COPD ([place])   . Hepatitis C   . High cholesterol   . Hypertension   . Pneumothorax   . Tobacco abuse      PAST SURGICAL HISTORY:   Past Surgical History:  Procedure Laterality Date  . CARDIAC CATHETERIZATION    . CHEST TUBE INSERTION    . IR GASTROSTOMY TUBE MOD SED  10/31/2017  . TRACHEOSTOMY TUBE PLACEMENT N/A 10/17/2017   Procedure: TRACHEOSTOMY;  Surgeon: Carloyn Manner, MD;  Location: [facility] ORS;  Service: ENT;  Laterality: N/A;      SOCIAL HISTORY:   Social History   Tobacco Use  . Smoking status: Current Every Day Smoker    Packs/day: 0.10    Years: 41.00    Pack years: 4.10    Types: Cigarettes  . Smokeless tobacco: Never Used  Substance Use Topics  . Alcohol use: No    Frequency: Never     FAMILY HISTORY:   Family History  Problem Relation Age of Onset  . Lung cancer Mother   . Asthma Mother   . CVA Father   . CAD Father   . Stroke Father   . Breast cancer Maternal Aunt 67  . COPD Sister      DRUG ALLERGIES:  No Known Allergies  MEDICATIONS AT HOME:   Prior to Admission medications   Medication Sig Start Date End Date Taking? Authorizing Provider  AMBULATORY NON FORMULARY MEDICATION Medication Name: Pulse oximeter DME:[facility] DX J44.9....J96.21 01/08/18   Flora Lipps, MD  aspirin 81 MG chewable tablet Chew 81 mg by mouth daily.    [provider]  budesonide (PULMICORT) 0.25 MG/2ML nebulizer solution Take 2 mLs (0.25 mg total) by nebulization 2 (two) times daily. 12/31/17   Conforti, John, DO  carvedilol (COREG) 3.125 MG tablet Take 3.125 mg by mouth 2 (two) times daily with a meal.    [provider]  cetirizine (  ZYRTEC) 10 MG tablet Take 10 mg by mouth daily.    [provider]  clonazePAM (KLONOPIN) 0.5 MG tablet Place 1 tablet (0.5 mg total) into feeding tube daily. Patient taking differently: Take 1 mg by mouth 2 (two) times daily. Takes 1 tablet bid and prn for anxiety 01/07/18   Flora Lipps, MD  enalapril (VASOTEC) 5 MG tablet Take 5 mg by mouth daily.    [provider]  famotidine (PEPCID) 20 MG tablet Place 1 tablet (20 mg total) into feeding tube daily. 12/31/17   Conforti, John, DO  fentaNYL (DURAGESIC - DOSED MCG/HR) 25 MCG/HR patch Place 1 patch (25 mcg total) onto the skin every 3 (three) days. 01/07/18   Flora Lipps, MD  fluticasone (FLONASE) 50 MCG/ACT nasal spray Place 2 sprays into both nostrils daily.    [provider]   ipratropium-albuterol (DUONEB) 0.5-2.5 (3) MG/3ML SOLN Take 3 mLs by nebulization every 6 (six) hours. 12/31/17   Conforti, John, DO  levalbuterol (XOPENEX) 1.25 MG/3ML nebulizer solution Take 1.25 mg by nebulization every 4 (four) hours as needed for wheezing.    [provider]  mirtazapine (REMERON) 45 MG tablet Take 45 mg by mouth at bedtime.    [provider]  oxyCODONE-acetaminophen (PERCOCET/ROXICET) 5-325 MG tablet Place 1 tablet into feeding tube every 8 (eight) hours as needed for severe pain. 01/07/18   Flora Lipps, MD  PARoxetine (PAXIL) 20 MG tablet Place 1 tablet (20 mg total) into feeding tube at bedtime. 12/31/17   Conforti, John, DO  QUEtiapine (SEROQUEL) 25 MG tablet Place 1 tablet (25 mg total) into feeding tube at bedtime. 12/31/17   Conforti, John, DO  senna (SENOKOT) 8.6 MG TABS tablet Take 1 tablet by mouth daily as needed for mild constipation.    [provider]  simvastatin (ZOCOR) 20 MG tablet Take 40 mg by mouth daily at 6 PM.    [provider]    REVIEW OF SYSTEMS:  Review of Systems  Constitutional: Negative for chills, fever, malaise/fatigue and weight loss.  HENT: Negative for ear pain, hearing loss and tinnitus.   Eyes: Negative for blurred vision, double vision, pain and redness.  Respiratory: Positive for shortness of breath. Negative for cough and hemoptysis.   Cardiovascular: Negative for chest pain, palpitations, orthopnea and leg swelling.  Gastrointestinal: Negative for abdominal pain, constipation, diarrhea, nausea and vomiting.  Genitourinary: Negative for dysuria, frequency and hematuria.  Musculoskeletal: Negative for back pain, joint pain and neck pain.  Skin:       No acne, rash, or lesions  Neurological: Negative for dizziness, tremors, focal weakness and weakness.  Endo/Heme/Allergies: Negative for polydipsia. Does not bruise/bleed easily.  Psychiatric/Behavioral: Negative for depression. The patient is not  nervous/anxious and does not have insomnia.      VITAL SIGNS:   Vitals:   10/19/18 2300 10/19/18 2310 10/19/18 2330 10/20/18 0000  BP:  (!) 153/93 (!) 143/83 140/87  Pulse: (!) 101 (!) 101 97 98  Resp: (!) 22 (!) 23 20 18   Temp:      TempSrc:      SpO2: 98% 98% 99% 98%  Weight:      Height:       Wt Readings from Last 3 Encounters:  10/19/18 59 kg  09/17/18 69.4 kg  07/28/18 60.6 kg    PHYSICAL EXAMINATION:  Physical Exam  Vitals reviewed. Constitutional: She is oriented to person, place, and time. She appears well-developed and well-nourished. No distress.  HENT:  Head: Normocephalic and atraumatic.  Mouth/Throat: Oropharynx is clear and moist.  Eyes: Pupils are equal, round, and reactive to light. Conjunctivae and EOM are normal. No scleral icterus.  Neck: Normal range of motion. Neck supple. No JVD present. No thyromegaly present.  Cardiovascular: Regular rhythm and intact distal pulses. Exam reveals no gallop and no friction rub.  No murmur heard. Borderline tachycardic  Respiratory: She is in respiratory distress. She has wheezes (Mild). She has no rales.  Chronic trach on ventilator  GI: Soft. Bowel sounds are normal. She exhibits no distension. There is no abdominal tenderness.  Musculoskeletal: Normal range of motion.        General: No edema.     Comments: No arthritis, no gout  Lymphadenopathy:    She has no cervical adenopathy.  Neurological: She is alert and oriented to person, place, and time. No cranial nerve deficit.  No dysarthria, no aphasia  Skin: Skin is warm and dry. No rash noted. No erythema.  Psychiatric: She has a normal mood and affect. Her behavior is normal. Judgment and thought content normal.    LABORATORY PANEL:   CBC Recent Labs  Lab 10/19/18 2306  WBC 7.6  HGB 11.2*  HCT 36.7  PLT 229   ------------------------------------------------------------------------------------------------------------------  Chemistries  Recent  Labs  Lab 10/19/18 2306  NA 142  K 3.3*  CL 111  CO2 24  GLUCOSE 193*  BUN 15  CREATININE 0.67  CALCIUM 8.3*  AST 23  ALT 17  ALKPHOS 132*  BILITOT 0.3   ------------------------------------------------------------------------------------------------------------------  Cardiac Enzymes No results for input(s): TROPONINI in the last 168 hours. ------------------------------------------------------------------------------------------------------------------  RADIOLOGY:  Dg Chest Port 1 View  Result Date: 10/19/2018 CLINICAL DATA:  58 y/o F; increasing shortness of breath. Under treatment for pneumonia. EXAM: PORTABLE CHEST 1 VIEW COMPARISON:  09/18/2018 chest radiograph FINDINGS: Stable normal cardiac silhouette. Improved aeration at the left lung base. No new focal consolidation. No pleural effusion or pneumothorax. Tracheostomy tube projects over the tracheal air column. Bones are unremarkable. IMPRESSION: Improved aeration at the left lung base. No new focal consolidation. Electronically Signed   By: Kristine Garbe M.D.   On: 10/19/2018 22:17    EKG:   Orders placed or performed during the hospital encounter of 10/19/18  . EKG 12-Lead  . EKG 12-Lead  . EKG 12-Lead  . EKG 12-Lead    IMPRESSION AND PLAN:  Principal Problem:   Acute on chronic respiratory failure with hypoxia (HCC) -ventilator settings adjusted and patient's oxygenation has improved.  Unclear underlying etiology upfront, though strong suspicion for COPD exacerbation.  However, PE is not ruled out.  Infection is much less likely given her work-up in the ED.  Vascular access was obtained in the ED, subsequently failed and so she has not been able to get her CTA chest.  We have started IV heparin empirically, the patient will need definitive access to get this study.  We are also treating her for COPD exacerbation as below Active Problems:   COPD exacerbation (HCC) -IV Solu-Medrol, duo nebs, azithromycin,  continue home meds, vent as above   Chronic respiratory failure requiring continuous mechanical ventilation through tracheostomy ([place]) -continue vent as above, treatment as above and below   CAD (coronary artery disease) -continue home meds   HTN (hypertension) -home dose antihypertensives   Chronic diastolic CHF (congestive heart failure) ([place]) -continue home meds  Chart review performed and case discussed with ED provider. Labs, imaging and/or ECG reviewed by provider and discussed with  patient/family. Management plans discussed with the patient and/or family.  COVID-19 status: Tested negative     DVT PROPHYLAXIS: Systemic anticoagulation  GI PROPHYLAXIS:  H2 blocker  ADMISSION STATUS: Inpatient     CODE STATUS: Full Code Status History    Date Active Date Inactive Code Status Order ID Comments User Context   09/17/2018 0631 09/20/2018 2006 Full Code [id number]  Christel Mormon, MD ED   10/28/2017 1711 12/31/2017 1911 Partial Code [id number]  Asencion Gowda, NP Inpatient   09/30/2017 1945 10/28/2017 1711 Full Code [id number]  [name], MD Inpatient   08/20/2017 1713 08/24/2017 1743 Full Code [id number]  Dustin Flock, MD Inpatient   06/29/2017 0625 07/02/2017 2008 Full Code [id number]  Harrie Foreman, MD Inpatient   02/23/2017 0241 02/26/2017 1634 Full Code [id number]  Harrie Foreman, MD Inpatient   01/01/2017 1144 01/04/2017 1354 Full Code [id number]  Bettey Costa, MD Inpatient   05/22/2016 2155 05/27/2016 1645 Full Code [id number]  Fritzi Mandes, MD Inpatient   04/23/2016 2020 04/27/2016 1848 Full Code [id number]  Hower, Aaron Mose, MD ED   02/07/2016 1922 02/10/2016 1835 Full Code [id number]  Gladstone Lighter, MD Inpatient   11/29/2015 0519 12/01/2015 1609 Full Code [id number]  Harrie Foreman, MD Inpatient   11/05/2015 0911 11/07/2015 1606 Full Code [id number]  Hillary Bow, MD ED   10/24/2014 1522 10/27/2014 1630 Full Code [id number]  Hillary Bow, MD Inpatient      TOTAL CRITICAL CARE  TIME TAKING CARE OF THIS PATIENT: 50 minutes.   This patient was evaluated in the context of the global COVID-19 pandemic, which necessitated consideration that the patient might be at risk for infection with the SARS-CoV-2 virus that causes COVID-19. Institutional protocols and algorithms that pertain to the evaluation of patients at risk for COVID-19 are in a state of rapid change based on information released by regulatory bodies including the CDC and federal and state organizations. These policies and algorithms were followed to the best of this provider's knowledge to date during the patient's care at this facility.  Ethlyn Daniels 10/20/2018, 1:00 AM  [facility]  Office  [phone]  CC: Primary care physician; Frazier Richards, MD  Note:  This document was prepared using Dragon voice recognition software and may include unintentional dictation errors.

## 2018-10-20 NOTE — Progress Notes (Signed)
Pharmacy Electrolyte Monitoring Consult:  Pharmacy consulted to assist in monitoring and replacing electrolytes in this 58 y.o. female admitted on 10/19/2018 with Respiratory Distress and Shortness of Breath   Labs:  Sodium (mmol/L)  Date Value  10/20/2018 139  07/11/2014 139   Potassium (mmol/L)  Date Value  10/20/2018 3.9  07/11/2014 4.6   Magnesium (mg/dL)  Date Value  10/20/2018 2.1  10/31/2013 2.6 (H)   Phosphorus (mg/dL)  Date Value  10/20/2018 3.4  10/31/2013 4.6   Calcium (mg/dL)  Date Value  10/20/2018 8.9   Calcium, Total (mg/dL)  Date Value  07/11/2014 8.9   Albumin (g/dL)  Date Value  10/19/2018 3.6  07/09/2014 3.7    Assessment/Plan: No replacement warranted at this time.   Will check BMP/Magnesium with am labs.   Pharmacy will continue to monitor and adjust per consult.   Asharia Lotter L 10/20/2018 3:19 PM

## 2018-10-20 NOTE — Progress Notes (Signed)
Update given this morning to patients sister, Freda Munro, all questions answered per patients request. Password, ISIAH was also set up by the patient and shared with her sister per patient request.

## 2018-10-20 NOTE — ED Provider Notes (Signed)
-----------------------------------------   12:50 AM on 10/20/2018 -----------------------------------------   (Note that documentation was delayed due to multiple ED patients requiring immediate care.)   The patient's work-up was generally reassuring.  Unfortunately she is a very difficult patient on which to obtain IV access.  The ED nurses tried multiple times and then the IV team tried multiple times.  They were able to get a midline catheter but it caused a little bit of pain with flushing and no one was comfortable using it for the CTA chest.  Her heart rate improved and she refused any additional IV sticks.  Given the circumstances, I think it is appropriate to admit her for further management of her shortness of breath and the hospitalist were informed of the plan.  If they want to obtain a VQ scan in the morning (if that is possible with her ventilated status), they can do so, or they can try again tomorrow with the IV team for access.  She is in no distress at this time.  Hospitalist will decide whether or not to treat empirically with heparin or just the standard empiric dose for an inpatient.  I authorized morphine 4 mg IV for her pain.  Patient's lab work is all reassuring with no obvious sign of infection or sepsis.   Hinda Kehr, MD 10/20/18 [phone]

## 2018-10-20 NOTE — Telephone Encounter (Signed)
T/c from daughter, patient is in hospital and will not be home for community palliative visit for 5/19. Requests to reschedule on hospital d/c.

## 2018-10-20 NOTE — Progress Notes (Signed)
SLP Cancellation Note  Patient Details Name: ORIANNA BISKUP MRN: [id number] DOB: June 20, 1960   Cancelled treatment:       Reason Eval/Treat Not Completed: Medical issues which prohibited therapy;Consult received. Chart reviewed. Pt w/current trach and chronic vent support. During last admission, on 09/19/2018,  it was recommended that pt f/u w/ her primary care team at [facility] for repeat assessment of swallowing for any new recommendations re: oral diet; initiating an oral diet eating/drinking on vent. Pt does have a PEG placement for TFs but states she "eats some" at home. Pt states she has not f/u w/ [facility] at this time and does not have an inline PMV. Rec.pt f/u with her [facility] team for further evaluation of her swallow to determine safest possible PO diet, if any.  Rec. inline PMV assessment which we cannot do here at this facility. Pt at significant risk of aspiration and respiratory compromise without an objective assessment of her swallow before prescribing a PO diet. Discussed above with nsg and MD who stated understanding. Rec continue with NPO for now.  Lanay Zinda, MA, CCC-SLP 10/20/2018, 4:49 PM

## 2018-10-20 NOTE — Progress Notes (Signed)
Assisted with patient transport to CT while patient being ventilated on her home Trilogy ventilator. No complications during trip, patient back in her room at this time.

## 2018-10-20 NOTE — Progress Notes (Signed)
ANTICOAGULATION CONSULT NOTE - Initial Consult  Pharmacy Consult for Lovenox  Indication: pulmonary embolus  No Known Allergies  Patient Measurements: Height: 5\' 2"  (157.5 cm) Weight: 130 lb (59 kg) IBW/kg (Calculated) : 50.1 Heparin Dosing Weight:   Vital Signs: Temp: 98.4 F (36.9 C) (05/17 2136) Temp Source: Oral (05/17 2136) BP: 140/87 (05/18 0000) Pulse Rate: 98 (05/18 0000)  Labs: Recent Labs    10/19/18 2306  HGB 11.2*  HCT 36.7  PLT 229  APTT 32  LABPROT 12.6  INR 1.0  CREATININE 0.67  TROPONINI <0.03    Estimated Creatinine Clearance: 61.4 mL/min (by C-G formula based on SCr of 0.67 mg/dL).   Medical History: Past Medical History:  Diagnosis Date  . Allergy   . Anxiety   . Asthma   . CHF (congestive heart failure) ([place])   . Cocaine abuse ([place])   . COPD (chronic obstructive pulmonary disease) ([place])   . Coronary artery disease   . Emphysema/COPD ([place])   . Hepatitis C   . High cholesterol   . Hypertension   . Pneumothorax   . Tobacco abuse     Medications:  (Not in a hospital admission)   Assessment: Pharmacy consulted to dose lovenox in this 58 year old female with possible PE.  No prior anticoag noted.  Pt was originally ordered heparin but switched to lovenox due to lost IV access.  CrCl = 61.4 ml/min  Goal of Therapy:  resolution of PE Monitor platelets by anticoagulation protocol: Yes   Plan:  Lovenox 60 mg SQ Q12H ordered to start on 5/18 @ 0200.   Kendrea Cerritos D 10/20/2018,2:22 AM

## 2018-10-20 NOTE — Progress Notes (Signed)
Assisted with patient transport to CT while patient being ventilated with home trilogy with no complications. Patient became anxious during trip and once returned to room patient had a panic attack. RT stayed with patient and attempted to keep her calm while RN obtained medicine to help her. Saturations remained at a 100% during trip.

## 2018-10-20 NOTE — Plan of Care (Signed)
  Problem: Education: Goal: Knowledge of General Education information will improve Description Including pain rating scale, medication(s)/side effects and non-pharmacologic comfort measures Outcome: Progressing   Problem: Clinical Measurements: Goal: Ability to maintain clinical measurements within normal limits will improve Outcome: Progressing Goal: Will remain free from infection Outcome: Progressing Goal: Diagnostic test results will improve Outcome: Progressing Goal: Respiratory complications will improve Outcome: Progressing Goal: Cardiovascular complication will be avoided Outcome: Progressing   Problem: Pain Managment: Goal: General experience of comfort will improve Outcome: Progressing   Problem: Safety: Goal: Ability to remain free from injury will improve Outcome: Progressing   Problem: Respiratory: Goal: Patent airway maintenance will improve Outcome: Progressing

## 2018-10-20 NOTE — Progress Notes (Signed)
CODE SEPSIS - PHARMACY COMMUNICATION  **Broad Spectrum Antibiotics should be administered within 1 hour of Sepsis diagnosis**  Time Code Sepsis Called/Page Received:  5/17 @ 2130   Antibiotics Ordered:  Vancomycin, Cefepime   Time of 1st antibiotic administration:   5/17 @ 2330   Additional action taken by pharmacy:  Abx administration was delayed due to lack of IV access ... called RN to remind her to give abx ASAP.     If necessary, Name of Provider/Nurse Contacted:     Gottlieb Zuercher D ,PharmD Clinical Pharmacist  10/20/2018  1:23 AM

## 2018-10-20 NOTE — Progress Notes (Signed)
Patient transported to CT for CTA. Upon arrival, previously functioning ML to LUE is infiltrated upon assessment by myself and CT tech. Attempted to obtain PIV access X 2 in CT room; unsuccessful. CTA is placed on hold. IV team consulted. MD notified.

## 2018-10-21 ENCOUNTER — Other Ambulatory Visit: Payer: Medicaid Other | Admitting: Primary Care

## 2018-10-21 ENCOUNTER — Inpatient Hospital Stay: Payer: Self-pay

## 2018-10-21 LAB — CBC WITH DIFFERENTIAL/PLATELET
Abs Immature Granulocytes: 0.11 10*3/uL — ABNORMAL HIGH (ref 0.00–0.07)
Basophils Absolute: 0 10*3/uL (ref 0.0–0.1)
Basophils Relative: 0 %
Eosinophils Absolute: 0 10*3/uL (ref 0.0–0.5)
Eosinophils Relative: 0 %
HCT: 34.4 % — ABNORMAL LOW (ref 36.0–46.0)
Hemoglobin: 10.7 g/dL — ABNORMAL LOW (ref 12.0–15.0)
Immature Granulocytes: 1 %
Lymphocytes Relative: 9 %
Lymphs Abs: 0.9 10*3/uL (ref 0.7–4.0)
MCH: 27 pg (ref 26.0–34.0)
MCHC: 31.1 g/dL (ref 30.0–36.0)
MCV: 86.9 fL (ref 80.0–100.0)
Monocytes Absolute: 0.5 10*3/uL (ref 0.1–1.0)
Monocytes Relative: 5 %
Neutro Abs: 8.8 10*3/uL — ABNORMAL HIGH (ref 1.7–7.7)
Neutrophils Relative %: 85 %
Platelets: 236 10*3/uL (ref 150–400)
RBC: 3.96 MIL/uL (ref 3.87–5.11)
RDW: 13.3 % (ref 11.5–15.5)
WBC: 10.3 10*3/uL (ref 4.0–10.5)
nRBC: 0 % (ref 0.0–0.2)

## 2018-10-21 LAB — MAGNESIUM: Magnesium: 2.4 mg/dL (ref 1.7–2.4)

## 2018-10-21 LAB — BASIC METABOLIC PANEL
Anion gap: 9 (ref 5–15)
BUN: 23 mg/dL — ABNORMAL HIGH (ref 6–20)
CO2: 22 mmol/L (ref 22–32)
Calcium: 8.8 mg/dL — ABNORMAL LOW (ref 8.9–10.3)
Chloride: 111 mmol/L (ref 98–111)
Creatinine, Ser: 0.52 mg/dL (ref 0.44–1.00)
GFR calc Af Amer: >60 mL/min (ref >60)
GFR calc non Af Amer: >60 mL/min (ref >60)
Glucose, Bld: 166 mg/dL — ABNORMAL HIGH (ref 70–99)
Potassium: 4 mmol/L (ref 3.5–5.1)
Sodium: 142 mmol/L (ref 135–145)

## 2018-10-21 LAB — URINE CULTURE: Culture: NO GROWTH

## 2018-10-21 LAB — GLUCOSE, CAPILLARY
Glucose-Capillary: 132 mg/dL — ABNORMAL HIGH (ref 70–99)
Glucose-Capillary: 143 mg/dL — ABNORMAL HIGH (ref 70–99)

## 2018-10-21 MED ORDER — JEVITY 1.2 CAL PO LIQD: 1000.0000 mL | ORAL | Status: DC

## 2018-10-21 MED ORDER — MIRTAZAPINE 15 MG PO TABS
45.0000 mg | ORAL_TABLET | Freq: Every day | ORAL | Status: DC
  Administered 2018-10-21 – 2018-10-22 (×2): 45 mg
  Filled 2018-10-21 (×2): qty 3

## 2018-10-21 MED ORDER — FAMOTIDINE 20 MG PO TABS
20.0000 mg | ORAL_TABLET | Freq: Two times a day (BID) | ORAL | Status: DC
  Administered 2018-10-21 – 2018-10-23 (×4): 20 mg
  Filled 2018-10-21 (×4): qty 1

## 2018-10-21 MED ORDER — ALPRAZOLAM 0.5 MG PO TABS
0.5000 mg | ORAL_TABLET | Freq: Three times a day (TID) | ORAL | Status: DC | PRN
  Administered 2018-10-21 – 2018-10-23 (×2): 0.5 mg
  Filled 2018-10-21 (×2): qty 1

## 2018-10-21 MED ORDER — JEVITY 1.5 CAL/FIBER PO LIQD
140.0000 mL | Freq: Every day | ORAL | Status: DC
  Administered 2018-10-21 – 2018-10-23 (×6): 140 mL

## 2018-10-21 MED ORDER — MORPHINE SULFATE (PF) 2 MG/ML IV SOLN
2.0000 mg | INTRAVENOUS | Status: DC | PRN
  Administered 2018-10-21 – 2018-10-23 (×2): 2 mg via INTRAVENOUS
  Filled 2018-10-21 (×2): qty 1

## 2018-10-21 MED ORDER — CLONAZEPAM 0.5 MG PO TABS
0.5000 mg | ORAL_TABLET | Freq: Two times a day (BID) | ORAL | Status: DC
  Administered 2018-10-21 – 2018-10-23 (×4): 0.5 mg
  Filled 2018-10-21 (×4): qty 1

## 2018-10-21 MED ORDER — CARVEDILOL 6.25 MG PO TABS
6.2500 mg | ORAL_TABLET | Freq: Two times a day (BID) | ORAL | Status: DC
  Administered 2018-10-21 – 2018-10-23 (×4): 6.25 mg via ORAL
  Filled 2018-10-21 (×4): qty 1

## 2018-10-21 MED ORDER — ADULT MULTIVITAMIN LIQUID CH
15.0000 mL | Freq: Every day | ORAL | Status: DC
  Administered 2018-10-21 – 2018-10-23 (×3): 15 mL
  Filled 2018-10-21 (×3): qty 15

## 2018-10-21 MED ORDER — FREE WATER
50.0000 mL | Freq: Three times a day (TID) | Status: DC
  Administered 2018-10-21 – 2018-10-23 (×6): 50 mL

## 2018-10-21 MED ORDER — PREDNISONE 5 MG/5ML PO SOLN
40.0000 mg | Freq: Every day | ORAL | Status: DC
  Administered 2018-10-22 – 2018-10-23 (×2): 40 mg
  Filled 2018-10-21 (×2): qty 40

## 2018-10-21 MED ORDER — MORPHINE SULFATE (PF) 4 MG/ML IV SOLN: 6.0000 mg | Freq: Once | INTRAVENOUS | Status: DC

## 2018-10-21 MED ORDER — ALPRAZOLAM 0.25 MG PO TABS
0.2500 mg | ORAL_TABLET | Freq: Two times a day (BID) | ORAL | Status: DC | PRN
  Administered 2018-10-21 (×2): 0.25 mg
  Filled 2018-10-21 (×2): qty 1

## 2018-10-21 NOTE — Progress Notes (Signed)
SLP Cancellation Note  Patient Details Name: Michelle [name] MRN: [id number] DOB: 1961-04-17   Cancelled treatment:       Reason Eval/Treat Not Completed: (chart reviewed.). Pt has readmitted after a recent admission 09/2018. Pt remains on chronic vent support as is baseline for pt. She does not use a PMV(and would have to be assessed for/use an inline PMV d/t chronic need of vent). PMV placement is recommended if possible for any oral intake. Pt states she "eats some" at home but did not indicate use of PMV. Per current CXR, "Improved aeration at the left lung base. No new focal consolidation."; no abnormal labs.  Continue to recommend that pt f/u w/ her primary care team at [facility] for any repeat assessment of swallowing(w/ or w/out PMV in place) for any new recommendations re: oral diet; initiating an oral diet of eating/drinking while on vent. Pt does have a PEG placement and can receive full TFs for nutritional support until then; Dietician following currently. Recommend frequent oral care for hygiene and stimulation of swallowing. MD updated.       Orinda Kenner, [place], CCC-SLP Cia Garretson 10/21/2018, 2:51 PM

## 2018-10-21 NOTE — Progress Notes (Signed)
[place] at [place] NAME: Michelle [name]    MR#:  [id number]  DATE OF BIRTH:  27-Oct-1960  SUBJECTIVE:  CHIEF COMPLAINT: Patient is feeling better.  Wants to eat by mouth At home she does some pleasure eating and sees [facility] speech therapy as an outpatient .intermittent episodes of cough Denies smoking  REVIEW OF SYSTEMS:  CONSTITUTIONAL: No fever, fatigue or weakness.  EYES: No blurred or double vision.  EARS, NOSE, AND THROAT: No tinnitus or ear pain.  Chronic trach and ventilator RESPIRATORY: Improving cough, shortness of breath, denies wheezing or hemoptysis.  CARDIOVASCULAR: No chest pain, orthopnea, edema.  GASTROINTESTINAL: No nausea, vomiting, diarrhea or abdominal pain.  GENITOURINARY: No dysuria, hematuria.  ENDOCRINE: No polyuria, nocturia,  HEMATOLOGY: No anemia, easy bruising or bleeding SKIN: No rash or lesion. MUSCULOSKELETAL: No joint pain or arthritis.   NEUROLOGIC: No tingling, numbness, weakness.  PSYCHIATRY: No anxiety or depression.   DRUG ALLERGIES:  No Known Allergies  VITALS:  Blood pressure (!) 148/83, pulse 67, temperature 98.1 F (36.7 C), temperature source Oral, resp. rate 16, height 5\' 1"  (1.549 m), weight 59 kg, last menstrual period 03/06/2005, SpO2 95 %.  PHYSICAL EXAMINATION:  GENERAL:  58 y.o.-year-old patient lying in the bed with no acute distress.  EYES: Pupils equal, round, reactive to light and accommodation. No scleral icterus. Extraocular muscles intact.  HEENT: Head atraumatic, normocephalic. Oropharynx and nasopharynx clear.  NECK:  Supple, no jugular venous distention. No thyroid enlargement, no tenderness.  Chronic tracheostomy with ventilator LUNGS: Minimal breath sounds bilaterally, no wheezing, rales,rhonchi or crepitation. No use of accessory muscles of respiration.  CARDIOVASCULAR: S1, S2 normal. No murmurs, rubs, or gallops.  ABDOMEN: Soft, nontender, nondistended. Bowel sounds  present. No organomegaly or mass.  EXTREMITIES: No pedal edema, cyanosis, or clubbing.  NEUROLOGIC: Awake alert and oriented x3 sensation intact. Gait not checked.  PSYCHIATRIC: The patient is alert and oriented x 3.  SKIN: No obvious rash, lesion, or ulcer.    LABORATORY PANEL:   CBC Recent Labs  Lab 10/21/18 0412  WBC 10.3  HGB 10.7*  HCT 34.4*  PLT 236   ------------------------------------------------------------------------------------------------------------------  Chemistries  Recent Labs  Lab 10/19/18 2306  10/21/18 0412  NA 142   < > 142  K 3.3*   < > 4.0  CL 111   < > 111  CO2 24   < > 22  GLUCOSE 193*   < > 166*  BUN 15   < > 23*  CREATININE 0.67   < > 0.52  CALCIUM 8.3*   < > 8.8*  MG  --    < > 2.4  AST 23  --   --   ALT 17  --   --   ALKPHOS 132*  --   --   BILITOT 0.3  --   --    < > = values in this interval not displayed.   ------------------------------------------------------------------------------------------------------------------  Cardiac Enzymes Recent Labs  Lab 10/19/18 2306  TROPONINI <0.03   ------------------------------------------------------------------------------------------------------------------  RADIOLOGY:  Ct Angio Chest Pe W Or Wo Contrast  Result Date: 10/20/2018 CLINICAL DATA:  Rule out pulmonary embolism. Increased short of breath with hypoxia EXAM: CT ANGIOGRAPHY CHEST WITH CONTRAST TECHNIQUE: Multidetector CT imaging of the chest was performed using the standard protocol during bolus administration of intravenous contrast. Multiplanar CT image reconstructions and MIPs were obtained to evaluate the vascular anatomy. CONTRAST:  <See Chart> 17 mL OMNIPAQUE IOHEXOL 350  MG/ML SOLN COMPARISON:  Chest x-ray 10/19/2018 FINDINGS: Cardiovascular: Negative for pulmonary embolism. Pulmonary arteries normal in caliber. Atherosclerotic aorta without aneurysm or dissection. Diffuse coronary calcification. Heart size normal. No  pericardial effusion. Mediastinum/Nodes: Negative for mass or adenopathy. Lungs/Pleura: Tracheostomy in good position. COPD with emphysema. Bibasilar atelectasis/scarring. Negative for pneumonia or pleural effusion. Upper Abdomen: Negative Musculoskeletal: Negative Review of the MIP images confirms the above findings. IMPRESSION: 1. Negative for pulmonary embolism. 2. Mild bibasilar atelectasis/scarring 3. COPD with emphysema.  Bibasilar atelectasis/scarring Aortic Atherosclerosis (ICD10-I70.0) and Emphysema (ICD10-J43.9). Electronically Signed   By: Franchot Gallo M.D.   On: 10/20/2018 11:11   Dg Chest Port 1 View  Result Date: 10/19/2018 CLINICAL DATA:  58 y/o F; increasing shortness of breath. Under treatment for pneumonia. EXAM: PORTABLE CHEST 1 VIEW COMPARISON:  09/18/2018 chest radiograph FINDINGS: Stable normal cardiac silhouette. Improved aeration at the left lung base. No new focal consolidation. No pleural effusion or pneumothorax. Tracheostomy tube projects over the tracheal air column. Bones are unremarkable. IMPRESSION: Improved aeration at the left lung base. No new focal consolidation. Electronically Signed   By: Kristine Garbe M.D.   On: 10/19/2018 22:17   [place] Ekg Site Rite  Result Date: 10/21/2018 If Site Rite image not attached, placement could not be confirmed due to current cardiac rhythm.   EKG:   Orders placed or performed during the hospital encounter of 10/19/18  . EKG 12-Lead  . EKG 12-Lead  . EKG 12-Lead  . EKG 12-Lead    ASSESSMENT AND PLAN:   #Acute on chronic respiratory failure with hypoxia-secondary to COPD exacerbation with history of end-stage COPD and acute on chronic diastolic CHF  clinically feeling better chronic vent dependent with chronic tracheostomy Normal procalcitonin and normal CBC not considering antibiotics Chest x-ray no new opacity IV steroids and bronchodilator treatments with nebulizer Discussed with intensivist   #Acute on  chronic diastolic CHF Oxygen as needed IV Lasix Monitor daily weights, intake and output CTA chest negative for pulmonary embolism but revealed COPD with emphysema Echocardiogram in 12/2017 has revealed a ventricular ejection fraction 60 to 65%   #High risk for aspiration Seen by speech therapy recommending outpatient follow-up with [facility] team for further evaluation of her swallow function determination.  Patient is at high risk for aspiration and respiratory compromise and needs a swallow evaluation before prescribing a p.o. diet..  N.p.o. for now Resume home regimen of Jevity 1.5 Cal gravity bolus of 140 mL 6 times daily per G-tube with free water flush of 10 mL before and after feeds. Provide additional free water flush of 50 mL Q8hrs gravity per tube per dietary  recommendations   #Hypertensive urgency Blood pressure is still elevated Coreg dose increased to 6.25 mg twice daily and continue close monitoring  #Coronary artery disease Currently asymptomatic continue close monitoring of the patient.  #Tobacco abuse disorder counseled patient to quit smoking for 5 minutes on 10/21/2018.  She verbalized understanding, nicotine patch offered     All the records are reviewed and case discussed with Care Management/Social Workerr. Management plans discussed with the patient, she is in agreement CODE STATUS: fc  TOTAL TIME TAKING CARE OF THIS PATIENT: 35 minutes.   POSSIBLE D/C IN 2-3 DAYS, DEPENDING ON CLINICAL CONDITION.  Note: This dictation was prepared with Dragon dictation along with smaller phrase technology. Any transcriptional errors that result from this process are unintentional.   Nicholes Mango M.D on 10/21/2018 at 2:27 PM  Between 7am to 6pm - Pager -  [phone] After 6pm go to [email or web address] - password [email or web address] [facility]  [place] [facility]  Office  [phone]  CC: Primary care physician; Frazier Richards, MD

## 2018-10-21 NOTE — Progress Notes (Signed)
Initial Nutrition Assessment  RD working remotely.  DOCUMENTATION CODES:   Not applicable  INTERVENTION:  Resume home regimen of Jevity 1.5 Cal gravity bolus of 140 mL 6 times daily per G-tube with free water flush of 10 mL before and after feeds.  Provide additional free water flush of 50 mL Q8hrs gravity per tube.  Total regimen provides 1260 kcal, 54 grams of protein, 17.6 grams of fiber, 908 mL H2O daily (638 mL from TFs and 270 mL from [facility]).  Provide liquid MVI daily per tube.  NUTRITION DIAGNOSIS:   Inadequate oral intake related to dysphagia, inability to eat as evidenced by NPO status(reliance on G-tube to meet calorie/protein/hydration needs).  GOAL:   Patient will meet greater than or equal to 90% of their needs  MONITOR:   Vent status, Labs, Weight trends, TF tolerance, I & O's  REASON FOR ASSESSMENT:   Ventilator    ASSESSMENT:   58 year old female with PMHx of emphysema, COPD, HTN, CAD, hx cocaine abuse, asthma, CHF, anxiety, hepatitis C, hx tracheostomy placement 10/17/2017 who is ventilator-dependent, and hx of G-tube placement 10/31/2017 who is admitted from home with acute on chronic respiratory failure with hypoxia secondary to COPD exacerbation and acute on chronic diastolic CHF.   Patient well-known to this RD from multiple previous admissions. Per chart patient able to communicate by writing. Since RD working remotely unable to obtain nutrition/weight history from patient at this time. At time of discharge in 01/2018 patient was on a continuous tube feeding regimen per G-tube and tolerating well. It appears per review of notes they had issues with keeping tubing "attached" to patient at home so a home care RN requested at one point to transition patient to a bolus regimen. Unsure if that ever happened. From a recent palliative note it appears patient is no longer taking her tube feeding at all and has started "eating by mouth."   Per SLP note patient needs  formal evaluation with [facility] team for inline PMV to see what PO diet would be safe for her. Plan is for patient to remain NPO here and resume tube feeds. Patient on home vent. On AC mode with FiO2 28% and PEEP 5 cmH2O.  Called and spoke with clinician at [facility] care who works with patient. She reports patient's home TF regimen is Jevity 1.5 Cal 140 mL 6 times per day per gravity bolus. She receives a free water flush of 10 mL before and after feeds, 30 mL before and after medications, and an additional 50 mL Q8hrs.   Per weight history in chart patient had gained 20 kg from 12/2017 to 09/2018. However, patient's weight is now about 10 kg lower at 59 kg, so unsure which weight is accurate. Weight may be falsely elevated by fluid.  Enteral Access: G-tube present on admission  Medications reviewed and include: azithromycin, carvedilol, clonazepam, famotidine, Remeron 45 mg QHS, prednisone 40 mg daily with breakfast, Seroquel 25 mg QHS.  Labs reviewed: BUN 23.  NUTRITION - FOCUSED PHYSICAL EXAM:  Unable to complete at this time.  Diet Order:   Diet Order            Diet NPO time specified  Diet effective now             EDUCATION NEEDS:   No education needs have been identified at this time  Skin:  Skin Assessment: Skin Integrity Issues:(non-pressure wound right buttocks (3cm x 0.5cm))  Last BM:  10/19/2018 per chart  Height:   Ht Readings from Last 1 Encounters:  10/20/18 5\' 1"  (1.549 m)   Weight:   Wt Readings from Last 1 Encounters:  10/21/18 59 kg   Ideal Body Weight:  47.7 kg  BMI:  Body mass index is 24.58 kg/m.  Estimated Nutritional Needs:   Kcal:  1300-1500  Protein:  60-70 grams  Fluid:  1-1.2 L/day  Willey Blade, MS, RD, LDN Office: [phone] Pager: [phone] After Hours/Weekend Pager: [phone]

## 2018-10-21 NOTE — Progress Notes (Signed)
Pharmacy Electrolyte Monitoring Consult:  Pharmacy consulted to assist in monitoring and replacing electrolytes in this 58 y.o. female admitted on 10/19/2018 with Respiratory Distress and Shortness of Breath   Labs:  Sodium (mmol/L)  Date Value  10/21/2018 142  07/11/2014 139   Potassium (mmol/L)  Date Value  10/21/2018 4.0  07/11/2014 4.6   Magnesium (mg/dL)  Date Value  10/21/2018 2.4  10/31/2013 2.6 (H)   Phosphorus (mg/dL)  Date Value  10/20/2018 3.4  10/31/2013 4.6   Calcium (mg/dL)  Date Value  10/21/2018 8.8 (L)   Calcium, Total (mg/dL)  Date Value  07/11/2014 8.9   Albumin (g/dL)  Date Value  10/19/2018 3.6  07/09/2014 3.7    Assessment/Plan: No replacement warranted at this time.   Will check BMPwith am labs.   Pharmacy will continue to monitor and adjust per consult.   Simpson,Michael L 10/21/2018 6:00 PM

## 2018-10-22 ENCOUNTER — Inpatient Hospital Stay: Payer: Medicaid Other

## 2018-10-22 ENCOUNTER — Other Ambulatory Visit: Payer: Self-pay

## 2018-10-22 LAB — CBC
HCT: 34.5 % — ABNORMAL LOW (ref 36.0–46.0)
Hemoglobin: 10.9 g/dL — ABNORMAL LOW (ref 12.0–15.0)
MCH: 27 pg (ref 26.0–34.0)
MCHC: 31.6 g/dL (ref 30.0–36.0)
MCV: 85.4 fL (ref 80.0–100.0)
Platelets: 249 10*3/uL (ref 150–400)
RBC: 4.04 MIL/uL (ref 3.87–5.11)
RDW: 13.2 % (ref 11.5–15.5)
WBC: 8.5 10*3/uL (ref 4.0–10.5)
nRBC: 0 % (ref 0.0–0.2)

## 2018-10-22 LAB — GLUCOSE, CAPILLARY
Glucose-Capillary: 114 mg/dL — ABNORMAL HIGH (ref 70–99)
Glucose-Capillary: 102 mg/dL — ABNORMAL HIGH (ref 70–99)
Glucose-Capillary: 131 mg/dL — ABNORMAL HIGH (ref 70–99)
Glucose-Capillary: 156 mg/dL — ABNORMAL HIGH (ref 70–99)
Glucose-Capillary: 146 mg/dL — ABNORMAL HIGH (ref 70–99)
Glucose-Capillary: 131 mg/dL — ABNORMAL HIGH (ref 70–99)
Glucose-Capillary: 82 mg/dL (ref 70–99)

## 2018-10-22 LAB — BASIC METABOLIC PANEL
Anion gap: 8 (ref 5–15)
BUN: 33 mg/dL — ABNORMAL HIGH (ref 6–20)
CO2: 24 mmol/L (ref 22–32)
Calcium: 8.8 mg/dL — ABNORMAL LOW (ref 8.9–10.3)
Chloride: 105 mmol/L (ref 98–111)
Creatinine, Ser: 0.63 mg/dL (ref 0.44–1.00)
GFR calc Af Amer: >60 mL/min (ref >60)
GFR calc non Af Amer: >60 mL/min (ref >60)
Glucose, Bld: 134 mg/dL — ABNORMAL HIGH (ref 70–99)
Potassium: 3.7 mmol/L (ref 3.5–5.1)
Sodium: 137 mmol/L (ref 135–145)

## 2018-10-22 LAB — MAGNESIUM: Magnesium: 2.5 mg/dL — ABNORMAL HIGH (ref 1.7–2.4)

## 2018-10-22 LAB — PHOSPHORUS: Phosphorus: 2.1 mg/dL — ABNORMAL LOW (ref 2.5–4.6)

## 2018-10-22 MED ORDER — POTASSIUM & SODIUM PHOSPHATES 280-160-250 MG PO PACK
1.0000 | PACK | Freq: Three times a day (TID) | ORAL | Status: AC
  Administered 2018-10-22 (×2): 1
  Filled 2018-10-22 (×2): qty 1

## 2018-10-22 NOTE — Progress Notes (Signed)
[place] at [place] NAME: Michelle [name]    MR#:  [id number]  DATE OF BIRTH:  1961/01/16  SUBJECTIVE:  CHIEF COMPLAINT: Patient is feeling tight in her chest today At home she does some pleasure eating and sees [facility] speech therapy as an outpatient .intermittent episodes of cough is improving Denies smoking  REVIEW OF SYSTEMS:  CONSTITUTIONAL: No fever, fatigue or weakness.  EYES: No blurred or double vision.  EARS, NOSE, AND THROAT: No tinnitus or ear pain.  Chronic trach and ventilator RESPIRATORY: Improving cough, shortness of breath, denies wheezing or hemoptysis.  CARDIOVASCULAR: No chest pain, orthopnea, edema.  GASTROINTESTINAL: No nausea, vomiting, diarrhea or abdominal pain.  GENITOURINARY: No dysuria, hematuria.  ENDOCRINE: No polyuria, nocturia,  HEMATOLOGY: No anemia, easy bruising or bleeding SKIN: No rash or lesion. MUSCULOSKELETAL: No joint pain or arthritis.   NEUROLOGIC: No tingling, numbness, weakness.  PSYCHIATRY: No anxiety or depression.   DRUG ALLERGIES:  No Known Allergies  VITALS:  Blood pressure (!) 157/86, pulse (!) 55, temperature 97.7 F (36.5 C), temperature source Oral, resp. rate 16, height 5\' 1"  (1.549 m), weight 58.3 kg, last menstrual period 03/06/2005, SpO2 100 %.  PHYSICAL EXAMINATION:  GENERAL:  58 y.o.-year-old patient lying in the bed with no acute distress.  EYES: Pupils equal, round, reactive to light and accommodation. No scleral icterus. Extraocular muscles intact.  HEENT: Head atraumatic, normocephalic. Oropharynx and nasopharynx clear.  NECK:  Supple, no jugular venous distention. No thyroid enlargement, no tenderness.  Chronic tracheostomy with ventilator LUNGS: Minimal breath sounds bilaterally, no wheezing, rales,rhonchi or crepitation. No use of accessory muscles of respiration.  CARDIOVASCULAR: S1, S2 normal. No murmurs, rubs, or gallops.  ABDOMEN: Soft, nontender, nondistended.  Bowel sounds present. No organomegaly or mass.  EXTREMITIES: No pedal edema, cyanosis, or clubbing.  NEUROLOGIC: Awake alert and oriented x3 sensation intact. Gait not checked.  PSYCHIATRIC: The patient is alert and oriented x 3.  SKIN: No obvious rash, lesion, or ulcer.    LABORATORY PANEL:   CBC Recent Labs  Lab 10/22/18 0303  WBC 8.5  HGB 10.9*  HCT 34.5*  PLT 249   ------------------------------------------------------------------------------------------------------------------  Chemistries  Recent Labs  Lab 10/19/18 2306  10/22/18 0303  NA 142   < > 137  K 3.3*   < > 3.7  CL 111   < > 105  CO2 24   < > 24  GLUCOSE 193*   < > 134*  BUN 15   < > 33*  CREATININE 0.67   < > 0.63  CALCIUM 8.3*   < > 8.8*  MG  --    < > 2.5*  AST 23  --   --   ALT 17  --   --   ALKPHOS 132*  --   --   BILITOT 0.3  --   --    < > = values in this interval not displayed.   ------------------------------------------------------------------------------------------------------------------  Cardiac Enzymes Recent Labs  Lab 10/19/18 2306  TROPONINI <0.03   ------------------------------------------------------------------------------------------------------------------  RADIOLOGY:  Ct Angio Chest Pe W Or Wo Contrast  Result Date: 10/20/2018 CLINICAL DATA:  Rule out pulmonary embolism. Increased short of breath with hypoxia EXAM: CT ANGIOGRAPHY CHEST WITH CONTRAST TECHNIQUE: Multidetector CT imaging of the chest was performed using the standard protocol during bolus administration of intravenous contrast. Multiplanar CT image reconstructions and MIPs were obtained to evaluate the vascular anatomy. CONTRAST:  <See Chart> 17 mL OMNIPAQUE IOHEXOL  350 MG/ML SOLN COMPARISON:  Chest x-ray 10/19/2018 FINDINGS: Cardiovascular: Negative for pulmonary embolism. Pulmonary arteries normal in caliber. Atherosclerotic aorta without aneurysm or dissection. Diffuse coronary calcification. Heart size  normal. No pericardial effusion. Mediastinum/Nodes: Negative for mass or adenopathy. Lungs/Pleura: Tracheostomy in good position. COPD with emphysema. Bibasilar atelectasis/scarring. Negative for pneumonia or pleural effusion. Upper Abdomen: Negative Musculoskeletal: Negative Review of the MIP images confirms the above findings. IMPRESSION: 1. Negative for pulmonary embolism. 2. Mild bibasilar atelectasis/scarring 3. COPD with emphysema.  Bibasilar atelectasis/scarring Aortic Atherosclerosis (ICD10-I70.0) and Emphysema (ICD10-J43.9). Electronically Signed   By: Franchot Gallo M.D.   On: 10/20/2018 11:11   [place] Ekg Site Rite  Result Date: 10/21/2018 If Site Rite image not attached, placement could not be confirmed due to current cardiac rhythm.   EKG:   Orders placed or performed during the hospital encounter of 10/19/18  . EKG 12-Lead  . EKG 12-Lead  . EKG 12-Lead  . EKG 12-Lead    ASSESSMENT AND PLAN:   #Acute on chronic respiratory failure with hypoxia-secondary to COPD exacerbation with history of end-stage COPD and acute on chronic diastolic CHF  clinically improving at a slower pace chronic vent dependent with chronic tracheostomy Normal procalcitonin and normal CBC not considering antibiotics Chest x-ray no new opacity IV steroids and bronchodilator treatments with nebulizer Discussed with intensivist Dr. Mortimer Fries  #Acute on chronic diastolic CHF Oxygen as needed IV Lasix Monitor daily weights, intake and output--2400 CTA chest negative for pulmonary embolism but revealed COPD with emphysema Echocardiogram in 12/2017 has revealed a ventricular ejection fraction 60 to 65%   #High risk for aspiration Seen by speech therapy recommending outpatient follow-up with [facility] team for further evaluation of her swallow function determination.  Patient is at high risk for aspiration and respiratory compromise and needs a swallow evaluation before prescribing a p.o. diet..  N.p.o. for now Resume  home regimen of Jevity 1.5 Cal gravity bolus of 140 mL 6 times daily per G-tube with free water flush of 10 mL before and after feeds. Provide additional free water flush of 50 mL Q8hrs gravity per tube per dietary  Recommendations For secretary will try to make an appointment with [facility] speech therapy for swallow evaluation after discharge  #Hypertensive urgency Blood pressure is still elevated Coreg dose increased to 6.25 mg twice daily and continue close monitoring We will add his HCTZ to the regimen titrate  #Coronary artery disease Currently asymptomatic continue close monitoring of the patient.  #Tobacco abuse disorder counseled patient to quit smoking for 5 minutes on 10/21/2018.  She verbalized understanding, nicotine patch offered     All the records are reviewed and case discussed with Care Management/Social Workerr. Management plans discussed with the patient, she is in agreement CODE STATUS: fc  TOTAL TIME TAKING CARE OF THIS PATIENT: 35 minutes.   POSSIBLE D/C IN 1-2 DAYS, DEPENDING ON CLINICAL CONDITION.  Note: This dictation was prepared with Dragon dictation along with smaller phrase technology. Any transcriptional errors that result from this process are unintentional.   Nicholes Mango M.D on 10/22/2018 at 8:48 AM  Between 7am to 6pm - Pager - [phone] After 6pm go to [email or web address] - password [email or web address] [facility]  [place] [facility]  Office  [phone]  CC: Primary care physician; Frazier Richards, MD

## 2018-10-22 NOTE — Progress Notes (Signed)
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  SpO2: 100 % FiO2 (%): 28 %   Review of Systems:  Gen:  Denies  fever, sweats, chills weigh loss  HEENT: Denies blurred vision, double vision, ear pain, eye pain, hearing loss, nose bleeds, sore throat Cardiac:  No dizziness, chest pain or heaviness, chest tightness,edema, No JVD Resp:   No cough, -sputum production, +shortness of breath,-wheezing, -hemoptysis,  Gi: Denies swallowing difficulty, stomach pain, nausea or vomiting, diarrhea, constipation, bowel incontinence Gu:  Denies bladder incontinence, burning urine Other:  All other systems negative  Physical Examination:   GENERAL:NAD, no fevers, chills, no weakness no fatigue HEAD: Normocephalic, atraumatic.  EYES: PERLA, EOMI No scleral icterus.  MOUTH: Moist mucosal membrane.  EAR, NOSE, THROAT: Clear without exudates. No external lesions. chronic Trach NECK: Supple. No thyromegaly.  No JVD.  PULMONARY: CTA B/L no wheezing, rhonchi, crackles CARDIOVASCULAR: S1 and S2. Regular rate and rhythm. No murmurs GASTROINTESTINAL: Soft, nontender, nondistended. Positive bowel sounds.  MUSCULOSKELETAL: No swelling, clubbing, or edema.  NEUROLOGIC: No gross focal neurological deficits. 5/5 strength all extremities SKIN: No ulceration, lesions, rashes, or cyanosis.  PSYCHIATRIC: Insight, judgment intact. -depression -anxiety ALL OTHER ROS ARE NEGATIVE    I personally reviewed Labs under Results section.   MEDICATIONS: I have reviewed all medications and confirmed  regimen as documented   CULTURE RESULTS   Recent Results (from the past 240 hour(s))  SARS Coronavirus 2 (CEPHEID- Performed in [place] hospital lab), Hosp Order     Status: None   Collection Time: 10/19/18 10:00 PM  Result Value Ref Range Status   SARS Coronavirus 2 NEGATIVE NEGATIVE Final    Comment: (NOTE) If result is NEGATIVE SARS-CoV-2 target nucleic acids are NOT DETECTED. The SARS-CoV-2 RNA is generally detectable in upper and lower  respiratory specimens during the acute phase of infection. The lowest  concentration of SARS-CoV-2 viral copies this assay can detect is 250  copies / mL. A negative result does not preclude SARS-CoV-2 infection  and should not be used as the sole basis for treatment or other  patient management decisions.  A negative result may occur with  improper specimen collection / handling, submission of specimen other  than nasopharyngeal swab, presence of viral mutation(s) within the  areas targeted by this assay, and inadequate number of viral copies  (<250 copies / mL). A negative result must be combined with clinical  observations, patient history, and epidemiological information. If result is POSITIVE SARS-CoV-2 target nucleic acids are DETECTED. The SARS-CoV-2 RNA is generally detectable in upper and lower  respiratory specimens dur ing the acute phase of infection.  Positive  results are indicative of active infection with SARS-CoV-2.  Clinical  correlation with patient history and other diagnostic information is  necessary to determine patient infection status.  Positive results do  not rule out bacterial infection or co-infection with other viruses. If result is PRESUMPTIVE POSTIVE SARS-CoV-2 nucleic acids MAY  BE PRESENT.   A presumptive positive result was obtained on the submitted specimen  and confirmed on repeat testing.  While 2019 novel coronavirus  (SARS-CoV-2) nucleic acids may be present in the submitted sample  additional  confirmatory testing may be necessary for epidemiological  and / or clinical management purposes  to differentiate between  SARS-CoV-2 and other Sarbecovirus currently known to infect humans.  If clinically indicated additional testing with an alternate test  methodology [phone]) is advised. The SARS-CoV-2 RNA is generally  detectable in upper and lower respiratory sp ecimens during the acute  phase of infection. The expected result is Negative. Fact Sheet for Patients:  StrictlyIdeas.no Fact Sheet for Healthcare Providers: BankingDealers.co.za This test is not yet approved or cleared by the [place] FDA and has been authorized for detection and/or diagnosis of SARS-CoV-2 by FDA under an Emergency Use Authorization (EUA).  This EUA will remain in effect (meaning this test can be used) for the duration of the COVID-19 declaration under Section 564(b)(1) of the Act, 21 U.S.C. section 360bbb-3(b)(1), unless the authorization is terminated or revoked sooner. Performed at [facility], [address] [place], [place] [address]   Blood Culture (routine x 2)     Status: None (Preliminary result)   Collection Time: 10/19/18 11:06 PM  Result Value Ref Range Status   Specimen Description BLOOD RIGHT HAND  Final   Special Requests   Final    BOTTLES DRAWN AEROBIC AND ANAEROBIC Blood Culture adequate volume   Culture   Final    NO GROWTH 3 DAYS Performed at [facility], [address], [place], [place] [address]    Report Status PENDING  Incomplete  Blood Culture (routine x 2)     Status: None (Preliminary result)   Collection Time: 10/19/18 11:20 PM  Result Value Ref Range Status   Specimen Description BLOOD RIGHT HAND  Final   Special Requests   Final    BOTTLES DRAWN AEROBIC AND ANAEROBIC Blood Culture adequate volume   Culture   Final    NO GROWTH 3 DAYS Performed at [facility], [address] [place], [place] [address]    Report Status PENDING  Incomplete  Urine culture     Status: None   Collection Time: 10/20/18  1:43 AM  Result Value Ref Range Status   Specimen Description   Final    URINE, RANDOM Performed at [facility], [address] [place], [place] [address]    Special Requests   Final    NONE Performed at [facility], [address] [place], [place] [address]    Culture   Final    NO GROWTH Performed at [facility], [place] [address], [place], [place] [address]    Report Status 10/21/2018 FINAL  Final  MRSA PCR Screening     Status: None   Collection Time: 10/20/18  3:16 AM  Result Value Ref Range Status   MRSA by PCR NEGATIVE NEGATIVE Final    Comment:        The GeneXpert MRSA Assay (FDA approved for NASAL specimens only), is one component of a comprehensive MRSA colonization surveillance program. It is not intended to diagnose MRSA infection nor to guide or monitor treatment for MRSA infections. Performed at [facility], [address] [place], [place] [address]            ASSESSMENT AND PLAN SYNOPSIS  Acute on chronic respiratory failure with hypoxia most  likely from COPD exacerbation patient also with acute diastolic heart failure  SEVERE COPD EXACERBATION -continue IV steroids as prescribed -continue NEB THERAPY as prescribed -morphine as needed -wean fio2 as needed and tolerated    ACUTE diastolic CARDIAC FAILURE- -oxygen as needed -Lasix as tolerated -follow up cardiac enzymes as indicated -follow up cardiology recs  ELECTROLYTES -follow labs as needed -replace as needed -pharmacy consultation and following    DVT/GI PRX ordered TRANSFUSIONS AS NEEDED MONITOR FSBS ASSESS the need for LABS as needed     CURRENT MEDICATIONS REVIEWED AT [place], M.D.  [facility]  Medical Director [place] Director [facility] Cardio-Pulmonary Department

## 2018-10-22 NOTE — Progress Notes (Signed)
Pharmacy Electrolyte Monitoring Consult:  Pharmacy consulted to assist in monitoring and replacing electrolytes in this 58 y.o. female admitted on 10/19/2018 with Respiratory Distress and Shortness of Breath   Labs:  Sodium (mmol/[name])  Date Value  10/22/2018 137  07/11/2014 139   Potassium (mmol/[name])  Date Value  10/22/2018 3.7  07/11/2014 4.6   Magnesium (mg/dL)  Date Value  10/22/2018 2.5 (H)  10/31/2013 2.6 (H)   Phosphorus (mg/dL)  Date Value  10/22/2018 2.1 ([name])  10/31/2013 4.6   Calcium (mg/dL)  Date Value  10/22/2018 8.8 ([name])   Calcium, Total (mg/dL)  Date Value  07/11/2014 8.9   Albumin (g/dL)  Date Value  10/19/2018 3.6  07/09/2014 3.7    Assessment/Plan: Will order Potassium & Sodium phosphates 1 packed TID x 2 doses.   Will check BMPwith am labs.   Pharmacy will continue to monitor and adjust per consult.   Michelle [name] 10/22/2018 11:31 AM

## 2018-10-23 DIAGNOSIS — J449 Chronic obstructive pulmonary disease, unspecified: Secondary | ICD-10-CM

## 2018-10-23 LAB — GLUCOSE, CAPILLARY
Glucose-Capillary: 106 mg/dL — ABNORMAL HIGH (ref 70–99)
Glucose-Capillary: 87 mg/dL (ref 70–99)
Glucose-Capillary: 182 mg/dL — ABNORMAL HIGH (ref 70–99)

## 2018-10-23 MED ORDER — PREDNISONE 20 MG PO TABS: 40.0000 mg | ORAL_TABLET | Freq: Every day | ORAL | 0 refills | Status: DC

## 2018-10-23 MED ORDER — AZITHROMYCIN 200 MG/5ML PO SUSR: 500.0000 mg | Freq: Every day | ORAL | 0 refills | Status: DC

## 2018-10-23 NOTE — Progress Notes (Signed)
Pt assisted with getting dressed and into wheelchair to be taken down to daughter's car. Pt taken to car by RT, RN and NT via wheelchair on home trilogy vent without difficulty. Adonis Huguenin with home health called and updated that pt was on her way home.

## 2018-10-23 NOTE — Plan of Care (Signed)
Pt ready for discharge. Daughters aware of plans for discharge today. Message left for [name] to be here just before 1300 with clothing for patient so that we may have time to get her dressed and down to the vehicle, so that they have time to get her home to meet the home health RN at 1400. Will follow up with [name] again to make sure that she got the message. Pt aware of plans for discharge and is in agreement with plan to go home.

## 2018-10-23 NOTE — Discharge Summary (Addendum)
Physician Discharge Summary  Patient ID: Michelle [name] MRN: [id number] DOB/AGE: 1960-12-12 58 y.o.  Admit date: 10/19/2018 Discharge date: 10/23/2018  Admission Diagnoses: COPD exacerbation  Discharge Diagnoses:  Principal Problem:   Acute on chronic respiratory failure with hypoxia ([place]) Active Problems:   COPD exacerbation (H[facility])   Chronic respiratory failure requiring continuous mechanical ventilation through tracheostomy ([place])   CAD (coronary artery disease)   HTN (hypertension)   Chronic diastolic CHF (congestive heart failure) ([place])   Discharged Condition: STABLE  Hospital Course: admitted for acute COPD exacerbation Placed on IV steroids and NEB therapy Anxiety meds as needed Patient with chronic resp failure on Home Vent    Discharge Exam: Blood pressure (!) 143/81, pulse 66, temperature 98.1 F (36.7 C), temperature source Oral, resp. rate 16, height 5\' 1"  (1.549 m), weight 58.3 kg, last menstrual period 03/06/2005, SpO2 97 %.   Disposition: Discharge disposition: 01-Home or Self Care        Allergies as of 10/23/2018   No Known Allergies     Medication List    TAKE these medications   AMBULATORY NON FORMULARY MEDICATION Medication Name: Pulse oximeter DME:[facility] DX J44.9....J96.21   aspirin 81 MG chewable tablet Chew 81 mg by mouth at bedtime.   budesonide 0.25 MG/2ML nebulizer solution Commonly known as:  PULMICORT Take 2 mLs (0.25 mg total) by nebulization 2 (two) times daily.   cetirizine 10 MG tablet Commonly known as:  ZYRTEC Place 10 mg into feeding tube daily.   clonazePAM 0.5 MG tablet Commonly known as:  KLONOPIN Place 1 tablet (0.5 mg total) into feeding tube daily. What changed:    how much to take  when to take this  additional instructions   famotidine 20 MG tablet Commonly known as:  PEPCID Place 1 tablet (20 mg total) into feeding tube daily.   fentaNYL 25 MCG/HR Commonly known as:  DURAGESIC Place 1 patch (25 mcg  total) onto the skin every 3 (three) days.   fluticasone 50 MCG/ACT nasal spray Commonly known as:  FLONASE Place 1 spray into both nostrils daily.   ipratropium-albuterol 0.5-2.5 (3) MG/3ML Soln Commonly known as:  DUONEB Take 3 mLs by nebulization every 6 (six) hours. What changed:    when to take this  reasons to take this   mirtazapine 30 MG tablet Commonly known as:  REMERON Take 30 mg by mouth at bedtime.   oxyCODONE-acetaminophen 5-325 MG tablet Commonly known as:  PERCOCET/ROXICET Place 1 tablet into feeding tube every 8 (eight) hours as needed for severe pain.   PARoxetine 20 MG tablet Commonly known as:  PAXIL Place 1 tablet (20 mg total) into feeding tube at bedtime.   polyethylene glycol 17 g packet Commonly known as:  MIRALAX / GLYCOLAX Place 17 g into feeding tube daily.   predniSONE 20 MG tablet Commonly known as:  Deltasone Take 2 tablets (40 mg total) by mouth daily with breakfast.   QUEtiapine 25 MG tablet Commonly known as:  SEROQUEL Place 1 tablet (25 mg total) into feeding tube at bedtime.   senna 8.6 MG Tabs tablet Commonly known as:  SENOKOT Place 1-2 tablets into feeding tube at bedtime.   simvastatin 20 MG tablet Commonly known as:  ZOCOR Take 40 mg by mouth every evening.      Follow up [place] 2-4 weeks Follow-up Information    Flora Lipps, MD Follow up on 12/08/2018.   Specialties:  Pulmonary Disease, Cardiology Contact information: [place]  [address] [place] [address] [phone]         continue vent as prescribed  Signed: Flora Lipps 10/23/2018, 11:41 AM

## 2018-10-23 NOTE — Progress Notes (Signed)
Pharmacy Electrolyte Monitoring Consult:  Pharmacy consulted to assist in monitoring and replacing electrolytes in this 58 y.o. female admitted on 10/19/2018 with Respiratory Distress and Shortness of Breath   Labs:  Sodium (mmol/L)  Date Value  10/22/2018 137  07/11/2014 139   Potassium (mmol/L)  Date Value  10/22/2018 3.7  07/11/2014 4.6   Magnesium (mg/dL)  Date Value  10/22/2018 2.5 (H)  10/31/2013 2.6 (H)   Phosphorus (mg/dL)  Date Value  10/22/2018 2.1 (L)  10/31/2013 4.6   Calcium (mg/dL)  Date Value  10/22/2018 8.8 (L)   Calcium, Total (mg/dL)  Date Value  07/11/2014 8.9   Albumin (g/dL)  Date Value  10/19/2018 3.6  07/09/2014 3.7    Assessment/Plan: Patient is being discharged today. Will complete consult.   Eamon Tantillo L 10/23/2018 11:32 AM

## 2018-10-23 NOTE — Progress Notes (Signed)
Daughters [name] and [name] updated on the plan to send pt home today. Explained that we don't know what time it will be as we have to coordinate with home health. [place] explained to this RN that pt rides in car and will not need to be transported by EMS this time. This RN explained that she would have to bring clothes to the medical mall entrance and they would be brought to the unit for the pt prior to pt being discharged. [name] both verbalized understanding that pt is to be discharged today.

## 2018-10-23 NOTE — Progress Notes (Signed)
Patient transported on her home trilogy vent with charge RN,  to her daughter's car,   to be taken to her place of residence.  I sent with the patient a size 4 cuffed trach and handed it to the daughter explaining I was sending it just in case for the ride home.  Patient tolerated transport well.

## 2018-10-23 NOTE — Progress Notes (Signed)
[facility] [place] NAME: Michelle [name]    MR#:  [id number]  DATE OF BIRTH:  Jun 02, 1961  SUBJECTIVE:  CHIEF COMPLAINT: Patient is feeling much bettertoday At home she does some pleasure eating and sees [facility] speech therapy as an outpatient. Daughter takes care of her at home  Denies smoking  REVIEW OF SYSTEMS:  CONSTITUTIONAL: No fever, fatigue or weakness.  EYES: No blurred or double vision.  EARS, NOSE, AND THROAT: No tinnitus or ear pain.  Chronic trach and ventilator RESPIRATORY: Improving cough, shortness of breath, denies wheezing or hemoptysis.  CARDIOVASCULAR: No chest pain, orthopnea, edema.  GASTROINTESTINAL: No nausea, vomiting, diarrhea or abdominal pain.  GENITOURINARY: No dysuria, hematuria.  ENDOCRINE: No polyuria, nocturia,  HEMATOLOGY: No anemia, easy bruising or bleeding SKIN: No rash or lesion. MUSCULOSKELETAL: No joint pain or arthritis.   NEUROLOGIC: No tingling, numbness, weakness.  PSYCHIATRY: No anxiety or depression.   DRUG ALLERGIES:  No Known Allergies  VITALS:  Blood pressure (!) 143/81, pulse 66, temperature 98.1 F (36.7 C), temperature source Oral, resp. rate 16, height 5\' 1"  (1.549 m), weight 58.3 kg, last menstrual period 03/06/2005, SpO2 97 %.  PHYSICAL EXAMINATION:  GENERAL:  58 y.o.-year-old patient lying in the bed with no acute distress.  EYES: Pupils equal, round, reactive to light and accommodation. No scleral icterus. Extraocular muscles intact.  HEENT: Head atraumatic, normocephalic. Oropharynx and nasopharynx clear.  NECK:  Supple, no jugular venous distention. No thyroid enlargement, no tenderness.  Chronic tracheostomy with ventilator LUNGS: Minimal breath sounds bilaterally, no wheezing, rales,rhonchi or crepitation. No use of accessory muscles of respiration.  CARDIOVASCULAR: S1, S2 normal. No murmurs, rubs, or gallops.  ABDOMEN: Soft, nontender, nondistended. Bowel sounds present.  No organomegaly or mass.  EXTREMITIES: No pedal edema, cyanosis, or clubbing.  NEUROLOGIC: Awake alert and oriented x3 sensation intact. Gait not checked.  PSYCHIATRIC: The patient is alert and oriented x 3.  SKIN: No obvious rash, lesion, or ulcer.    LABORATORY PANEL:   CBC Recent Labs  Lab 10/22/18 0303  WBC 8.5  HGB 10.9*  HCT 34.5*  PLT 249   ------------------------------------------------------------------------------------------------------------------  Chemistries  Recent Labs  Lab 10/19/18 2306  10/22/18 0303  NA 142   < > 137  K 3.3*   < > 3.7  CL 111   < > 105  CO2 24   < > 24  GLUCOSE 193*   < > 134*  BUN 15   < > 33*  CREATININE 0.67   < > 0.63  CALCIUM 8.3*   < > 8.8*  MG  --    < > 2.5*  AST 23  --   --   ALT 17  --   --   ALKPHOS 132*  --   --   BILITOT 0.3  --   --    < > = values in this interval not displayed.   ------------------------------------------------------------------------------------------------------------------  Cardiac Enzymes Recent Labs  Lab 10/19/18 2306  TROPONINI <0.03   ------------------------------------------------------------------------------------------------------------------  RADIOLOGY:  Dg Chest Port 1 View  Result Date: 10/22/2018 CLINICAL DATA:  58 year old female with shortness of breath. Negative for COVID-19 on 10/19/2018. EXAM: PORTABLE CHEST 1 VIEW COMPARISON:  CTA chest 10/20/2018 and earlier. FINDINGS: Portable AP upright view at 2107 hours. Stable tracheostomy. Stable cardiac size and mediastinal contours. Stable lung volumes. Attenuation of lung markings in the upper lobes in keeping with the emphysema demonstrated by CTA. Mild streaky opacity  at both lung bases appears stable from the recent CT. No pneumothorax, pleural effusion, pulmonary edema or new opacity. IMPRESSION: Stable from the recent CTA. Emphysema with streaky bilateral lung base opacity which most resembles atelectasis and/or scarring.  Electronically Signed   By: Genevie Ann M.D.   On: 10/22/2018 21:32    EKG:   Orders placed or performed during the hospital encounter of 10/19/18  . EKG 12-Lead  . EKG 12-Lead  . EKG 12-Lead  . EKG 12-Lead    ASSESSMENT AND PLAN:   #Acute on chronic respiratory failure with hypoxia-secondary to COPD exacerbation with history of end-stage COPD and acute on chronic diastolic CHF  clinically improving with iv lasix , change to po  chronic vent dependent with chronic tracheostomy Normal procalcitonin and normal CBC not considering antibiotics Chest x-ray no new opacity IV steroids tapered to po and bronchodilator treatments with nebulizer Discussed with intensivist Dr. Mortimer Fries, planning to dc pt to daughters care today   #Acute on chronic diastolic CHF Oxygen as needed IV Lasix to po  Monitor daily weights, intake and output--2850 CTA chest negative for pulmonary embolism but revealed COPD with emphysema Echocardiogram in 12/2017 has revealed a ventricular ejection fraction 60 to 65%   #High risk for aspiration Seen by speech therapy recommending outpatient follow-up with [facility] team for further evaluation of her swallow function determination.  Patient is at high risk for aspiration and respiratory compromise and needs a swallow evaluation before prescribing a p.o. diet..  N.p.o. for now Resume home regimen of [place]ev[place] 1.5 Cal gravity bolus of 140 mL 6 times daily per G-tube with free water flush of 10 mL before and after feeds. Provide additional free water flush of 50 mL Q8hrs gravity per tube per dietary  Recommendations  secretary will try to make an appointment with [facility] speech therapy for swallow evaluation after discharge  #Hypertensive urgency BP is much better  Coreg dose increased to 6.25 mg twice daily and continue close monitoring  #Coronary artery disease Currently asymptomatic continue close monitoring of the patient.  #Tobacco abuse disorder counseled patient to quit  smoking for 5 minutes on 10/21/2018.  She verbalized understanding, nicotine patch offered  Disposition   : Intensivist Dr. Mortimer Fries, planning to dc pt to daughters care today    Will sign off   All the records are reviewed and case discussed with Care Management/Social Workerr. Management plans discussed with the patient, she is in agreement CODE STATUS: fc  TOTAL TIME TAKING CARE OF THIS PATIENT: 33  minutes.   .  Note: This dictation was prepared with Dragon dictation along with smaller phrase technology. Any transcriptional errors that result from this process are unintentional.   Nicholes Mango M.D on 10/23/2018 at 10:50 AM  Between 7am to 6pm - Pager - [phone] After 6pm go to [email or web address] - password [email or web address] [facility]  [facility]  Office  [phone]  CC: Primary care physician; Frazier Richards, MD

## 2018-10-24 LAB — CULTURE, BLOOD (ROUTINE X 2)
Culture: NO GROWTH
Culture: NO GROWTH
Special Requests: ADEQUATE
Special Requests: ADEQUATE

## 2018-10-28 ENCOUNTER — Other Ambulatory Visit: Payer: Self-pay

## 2018-10-28 ENCOUNTER — Other Ambulatory Visit: Payer: Medicaid Other | Admitting: Primary Care

## 2018-10-28 DIAGNOSIS — Z515 Encounter for palliative care: Secondary | ICD-10-CM

## 2018-10-28 NOTE — Progress Notes (Signed)
[facility] Palliative Care Consult Note Telephone: [phone]  Fax: [phone]   TELEHEALTH VISIT STATEMENT  Due to the COVID-19 crisis, this visit was done via telemedicine from my office. It was initiated and consented to by this patient and/or family. Due to patient technologic barriers, this encounter was conducted by telephone.   PATIENT NAME: Michelle [name] DOB: 10-01-1960 MRN: [id number]  PRIMARY CARE PROVIDER:   Frazier Richards, MD  REFERRING PROVIDER:  Frazier Richards, [place], [place] [address]  RESPONSIBLE PARTY:   Extended Emergency Contact Information Primary Emergency Contact: [place], [place] Phone: [phone] Mobile Phone: [phone] Relation: Daughter Secondary Emergency Contact: Elizabeth Sauer Address: [address] [place],  [address] Johnnette Litter of [place] Phone: [phone] Mobile Phone: [phone] Relation: Daughter  Palliative Care was asked to follow patient by consultation request of Dr. Frazier Richards, MD  . This is a follow up visit.   ASSESSMENT and RECOMMENDATIONS:   1. Bleeding during suction: Recommend referral to  Wanda at [facility].  [facility] states she  needs a referral. Has had some bleeding on suctioning, and some  Issues related to swallowing, aspiration and tube feedings. Daughter states this has not worsened but they are concerned.  2. Debility: Home health PT Has not come out yet. Recommend/ need to f/u with this referral. States this is coming from [facility]. I am not familiar with this service.   3. Depression and anxiety: Recommend returning to Paxil 30 mg historically has worked well, titrate to effect. May need to speak with SW Margaretmary Lombard from [facility] or pastor from their church about anxiety and mood. Daughter states having pastor visit will be good.   4. Nutrition/Constipation: Needs aggressive and daily bowel program, recommend   Miralax 17 gm daily, senna two tablets bid. Appetite is fair and soft/prepared food taken PO. Has feeding but it bloats her stomach on the jevity  140 ml every 6 hrs. Previously has tolerated feeds without constipation in the past but now is refusing feedings due to bloating. Recommend bowel management and review of toleration of feedings by gi. Asked daughter to f/u with appts for assessment of swallowing.  5. Insomnia: Recommend restarting paxil 30 mg nightly  for anxiety and depression nad insomnia.Titrate to effect. Could also offer trazodone for hs.  Sleeping all day and up all night.   6. Goals of Care: Daughter [name] is care giver. She is working on [employer]. Discussed goals of care, and she(patient)  does not want resuscuitation. They have discussed and patient does not want to be brought back. They are pending discussion of more specific directives. Will work on DNR and MOST documents on next visit.  Palliative care will continue to follow for goals of care clarification and symptom management. Return 1 week or prn.  I spent 60 minutes providing this consultation,  from 1430 to 1530. More than 50% of the time in this consultation was spent coordinating communication.   HISTORY OF PRESENT ILLNESS:  Michelle [name] is a 59 y.o. year old female with multiple medical problems including COPD, CHF, CAD, asthma, HTN, tobacco abuse, tracheostomy, g tube placement. Palliative Care was asked to help address goals of care.   CODE STATUS: DNR (daughter first stated full code but then said she meant the choice of not attempting resuscitation).  PPS: 30% HOSPICE ELIGIBILITY/DIAGNOSIS: TBD  PAST MEDICAL  HISTORY:  Past Medical History:  Diagnosis Date  . Allergy   . Anxiety   . Asthma   . CHF (congestive heart failure) ([place])   . Cocaine abuse ([place])   . COPD (chronic obstructive pulmonary disease) ([place])   . Coronary artery disease   . Emphysema/COPD ([place])   . Hepatitis C   . High  cholesterol   . Hypertension   . Pneumothorax   . Tobacco abuse     SOCIAL HX:  Social History   Tobacco Use  . Smoking status: Current Every Day Smoker    Packs/day: 0.10    Years: 41.00    Pack years: 4.10    Types: Cigarettes  . Smokeless tobacco: Never Used  Substance Use Topics  . Alcohol use: No    Frequency: Never    ALLERGIES: No Known Allergies   PERTINENT MEDICATIONS:  Outpatient Encounter Medications as of 10/28/2018  Medication Sig  . AMBULATORY NON FORMULARY MEDICATION Medication Name: Pulse oximeter DME:AHC DX J44.9....J96.21  . aspirin 81 MG chewable tablet Chew 81 mg by mouth at bedtime.   . budesonide (PULMICORT) 0.25 MG/2ML nebulizer solution Take 2 mLs (0.25 mg total) by nebulization 2 (two) times daily.  . cetirizine (ZYRTEC) 10 MG tablet Place 10 mg into feeding tube daily.   . clonazePAM (KLONOPIN) 0.5 MG tablet Place 1 tablet (0.5 mg total) into feeding tube daily. (Patient taking differently: Place 1 mg into feeding tube 2 (two) times daily. (may also take as needed for anxiety))  . famotidine (PEPCID) 20 MG tablet Place 1 tablet (20 mg total) into feeding tube daily.  . fentaNYL (DURAGESIC - DOSED MCG/HR) 25 MCG/HR patch Place 1 patch (25 mcg total) onto the skin every 3 (three) days.  . fluticasone (FLONASE) 50 MCG/ACT nasal spray Place 1 spray into both nostrils daily.   Marland Kitchen ipratropium-albuterol (DUONEB) 0.5-2.5 (3) MG/3ML SOLN Take 3 mLs by nebulization every 6 (six) hours. (Patient taking differently: Take 3 mLs by nebulization every 6 (six) hours as needed (for shortness of breath). )  . mirtazapine (REMERON) 30 MG tablet Take 30 mg by mouth at bedtime.  Marland Kitchen oxyCODONE-acetaminophen (PERCOCET/ROXICET) 5-325 MG tablet Place 1 tablet into feeding tube every 8 (eight) hours as needed for severe pain.  Marland Kitchen PARoxetine (PAXIL) 20 MG tablet Place 1 tablet (20 mg total) into feeding tube at bedtime.  . polyethylene glycol (MIRALAX / GLYCOLAX) 17 g packet Place 17  g into feeding tube daily.  . predniSONE (DELTASONE) 20 MG tablet Take 2 tablets (40 mg total) by mouth daily with breakfast.  . QUEtiapine (SEROQUEL) 25 MG tablet Place 1 tablet (25 mg total) into feeding tube at bedtime.  . senna (SENOKOT) 8.6 MG TABS tablet Place 1-2 tablets into feeding tube at bedtime.   . simvastatin (ZOCOR) 20 MG tablet Take 40 mg by mouth every evening.    No facility-administered encounter medications on file as of 10/28/2018.     PHYSICAL EXAM/ROS 128- 130 lb per hospital record.  General: NAD, frail , not taking po well, not sleeping well Cardiovascular: no chest pain, no edema Pulmonary: trach and home trilogy, family suctioning. No increase in SOB or cough Abdomen: endorses severe constipation, taking some PO, not tolerating tube feeds, c/o bloating. FM:BWGYKZ dysuria Extremities: pivot transfers to chair Skin: no rashes or wounds, gtube site intact Neurological: Weakness, poor activity tolerance.  Cyndia Skeeters DNP, AGPCNP-BC

## 2018-11-04 ENCOUNTER — Other Ambulatory Visit: Payer: Medicaid Other | Admitting: Primary Care

## 2018-11-04 ENCOUNTER — Other Ambulatory Visit: Payer: Self-pay

## 2018-11-04 MED ORDER — CLONAZEPAM 1 MG PO TABS
1.00 | ORAL_TABLET | ORAL | Status: DC
Start: 2018-11-08 — End: 2018-11-04

## 2018-11-04 MED ORDER — FLUTICASONE PROPIONATE 50 MCG/ACT NA SUSP
1.00 | NASAL | Status: DC
Start: 2018-11-04 — End: 2018-11-04

## 2018-11-04 MED ORDER — GENERIC EXTERNAL MEDICATION
1250.00 | Status: DC
Start: 2018-11-05 — End: 2018-11-04

## 2018-11-04 MED ORDER — ACETAMINOPHEN 325 MG PO TABS
650.00 | ORAL_TABLET | ORAL | Status: DC
Start: ? — End: 2018-11-04

## 2018-11-04 MED ORDER — FENTANYL 25 MCG/HR TD PT72
1.00 | MEDICATED_PATCH | TRANSDERMAL | Status: DC
Start: 2018-11-09 — End: 2018-11-04

## 2018-11-04 MED ORDER — ASPIRIN 81 MG PO CHEW
81.00 | CHEWABLE_TABLET | ORAL | Status: DC
Start: 2018-11-07 — End: 2018-11-04

## 2018-11-04 MED ORDER — VICON FORTE PO CAPS
17.00 | ORAL_CAPSULE | ORAL | Status: DC
Start: 2018-11-04 — End: 2018-11-04

## 2018-11-04 MED ORDER — MIRTAZAPINE 15 MG PO TABS
15.00 | ORAL_TABLET | ORAL | Status: DC
Start: 2018-11-07 — End: 2018-11-04

## 2018-11-04 MED ORDER — PAROXETINE HCL 20 MG PO TABS
20.00 | ORAL_TABLET | ORAL | Status: DC
Start: 2018-11-08 — End: 2018-11-04

## 2018-11-04 MED ORDER — OXYCODONE HCL 5 MG PO TABS
5.00 | ORAL_TABLET | ORAL | Status: DC
Start: ? — End: 2018-11-04

## 2018-11-04 MED ORDER — GENERIC EXTERNAL MEDICATION
Status: DC
Start: ? — End: 2018-11-04

## 2018-11-04 MED ORDER — CEFEPIME HCL 2 GM/100ML IV SOLN
2.00 | INTRAVENOUS | Status: DC
Start: 2018-11-07 — End: 2018-11-04

## 2018-11-04 MED ORDER — ENOXAPARIN SODIUM 40 MG/0.4ML ~~LOC~~ SOLN
40.00 | SUBCUTANEOUS | Status: DC
Start: 2018-11-07 — End: 2018-11-04

## 2018-11-04 MED ORDER — GENERIC EXTERNAL MEDICATION
100.00 | Status: DC
Start: 2018-11-07 — End: 2018-11-04

## 2018-11-04 MED ORDER — QUETIAPINE FUMARATE 25 MG PO TABS
25.00 | ORAL_TABLET | ORAL | Status: DC
Start: 2018-11-07 — End: 2018-11-04

## 2018-11-04 MED ORDER — CETIRIZINE HCL 10 MG PO TABS
10.00 | ORAL_TABLET | ORAL | Status: DC
Start: 2018-11-07 — End: 2018-11-04

## 2018-11-04 MED ORDER — IPRATROPIUM-ALBUTEROL 0.5-2.5 (3) MG/3ML IN SOLN
3.00 | RESPIRATORY_TRACT | Status: DC
Start: 2018-11-07 — End: 2018-11-04

## 2018-11-04 MED ORDER — HCA GLYCERIN (INFANT) 1.5 G RE SUPP
80.00 | RECTAL | Status: DC
Start: 2018-11-07 — End: 2018-11-04

## 2018-11-05 ENCOUNTER — Other Ambulatory Visit: Payer: Self-pay

## 2018-11-05 ENCOUNTER — Telehealth: Payer: Self-pay | Admitting: Primary Care

## 2018-11-05 ENCOUNTER — Other Ambulatory Visit: Payer: Medicaid Other | Admitting: Primary Care

## 2018-11-05 MED ORDER — SENNOSIDES 8.6 MG PO TABS
2.00 | ORAL_TABLET | ORAL | Status: DC
Start: 2018-11-07 — End: 2018-11-05

## 2018-11-05 MED ORDER — FLUTICASONE PROPIONATE 50 MCG/ACT NA SUSP
2.00 | NASAL | Status: DC
Start: 2018-11-07 — End: 2018-11-05

## 2018-11-05 MED ORDER — POLYETHYLENE GLYCOL 3350 17 G PO PACK
17.00 | PACK | ORAL | Status: DC
Start: 2018-11-07 — End: 2018-11-05

## 2018-11-05 MED ORDER — SODIUM CHLORIDE 3 % IN NEBU
4.00 | INHALATION_SOLUTION | RESPIRATORY_TRACT | Status: DC
Start: 2018-11-05 — End: 2018-11-05

## 2018-11-05 MED ORDER — LISINOPRIL 5 MG PO TABS
5.00 | ORAL_TABLET | ORAL | Status: DC
Start: 2018-11-07 — End: 2018-11-05

## 2018-11-05 NOTE — Telephone Encounter (Signed)
T/c for appt today. No answer, message left.

## 2018-11-07 ENCOUNTER — Other Ambulatory Visit: Payer: Self-pay

## 2018-11-07 ENCOUNTER — Other Ambulatory Visit: Payer: Medicaid Other | Admitting: Primary Care

## 2018-11-07 MED ORDER — OLANZAPINE-FLUOXETINE HCL 6-50 MG PO CAPS
3.00 | ORAL_CAPSULE | ORAL | Status: DC
Start: ? — End: 2018-11-07

## 2018-11-07 MED ORDER — SODIUM CHLORIDE 3 % IN NEBU
4.00 | INHALATION_SOLUTION | RESPIRATORY_TRACT | Status: DC
Start: 2018-11-07 — End: 2018-11-07

## 2018-11-13 IMAGING — US US EXTREM  UP VENOUS*R*
1 series · 13 of 24 positions shown · non-contrast
Comparison: None.

CLINICAL DATA: Right upper extremity edema. History of right upper
extremity approach PICC line. Evaluate for DVT.



[Series 1: us extrem up venous*right* · 0.07mm/px · 13 of 34 slices shown]
[im 1/34]
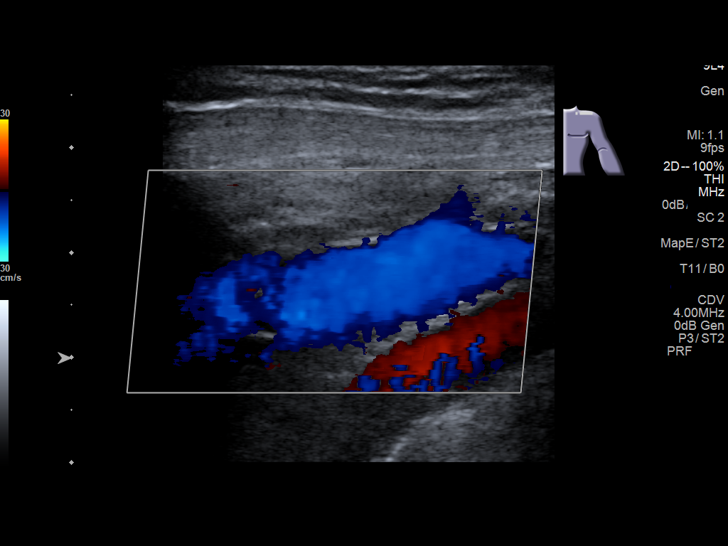
[im 3/34]
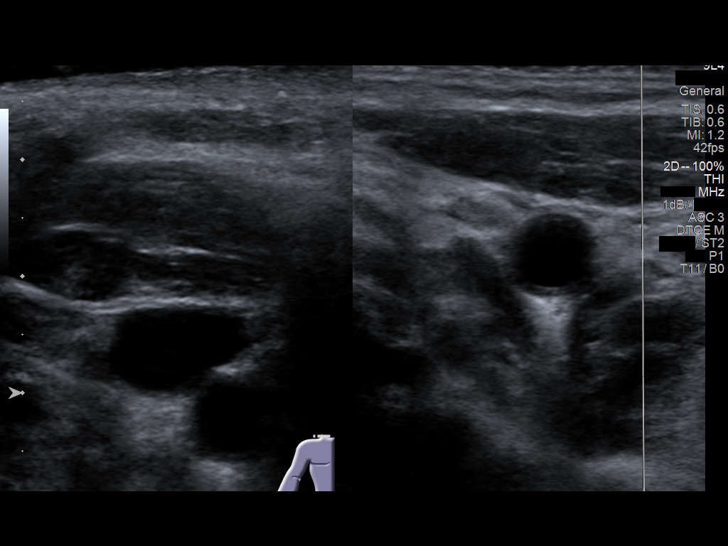
[im 6/34]
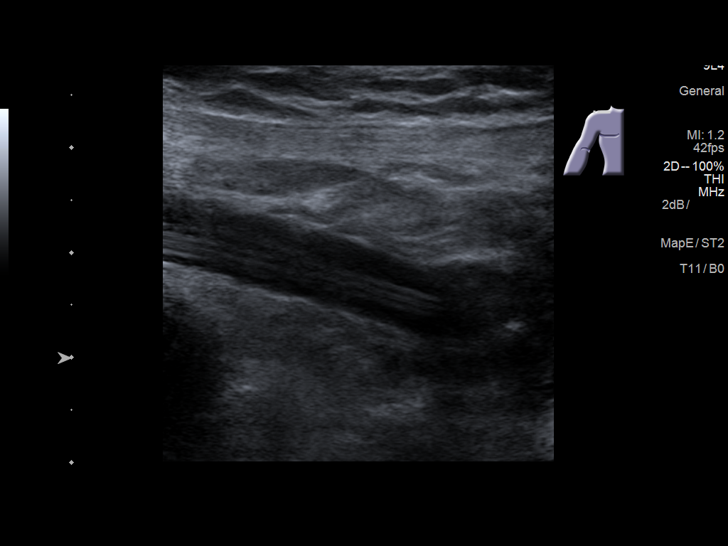
[im 9/34]
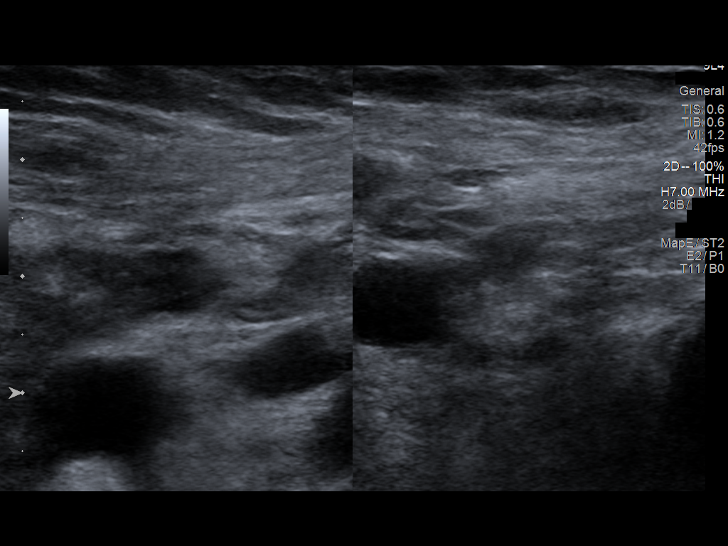
[im 12/34]
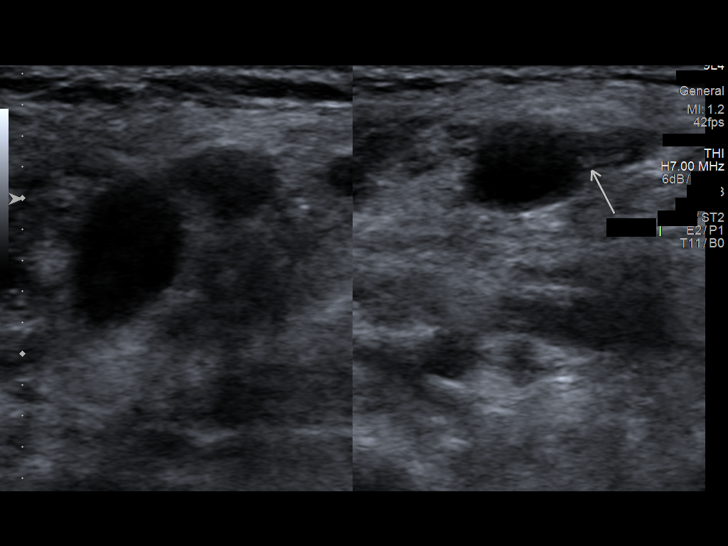
[im 15/34]
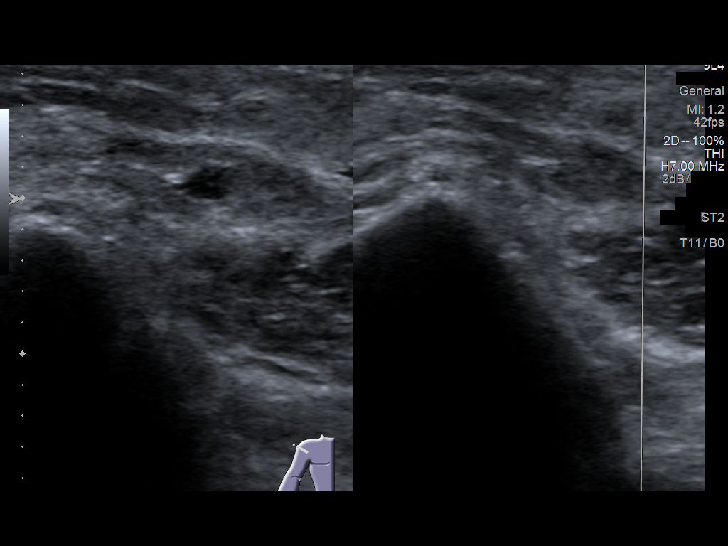
[im 18/34]
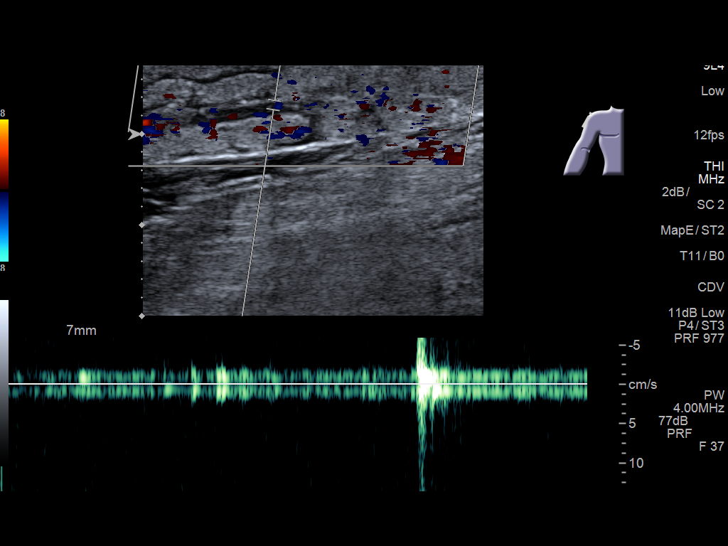
[im 19/34]
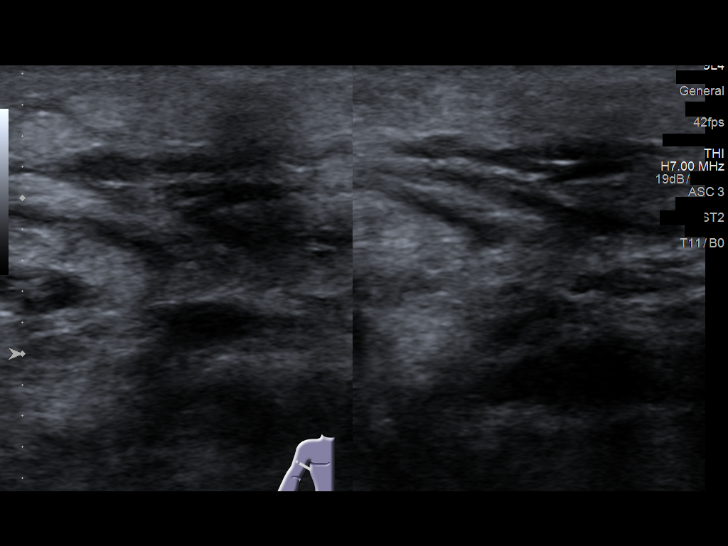
[im 22/34]
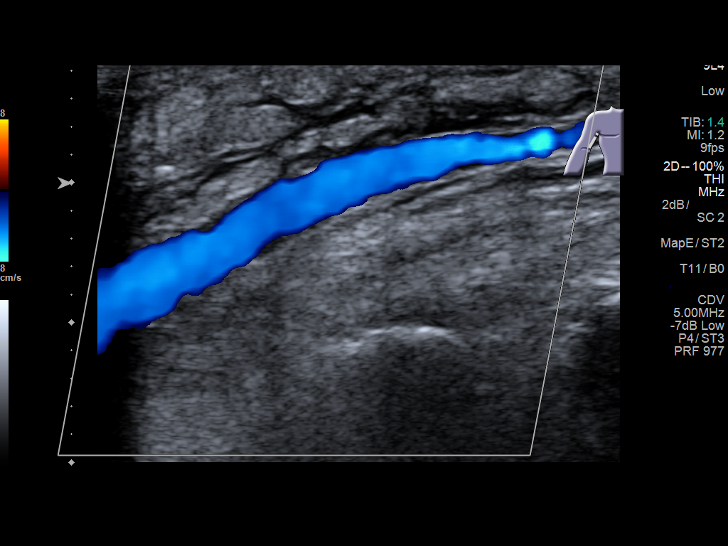
[im 25/34]
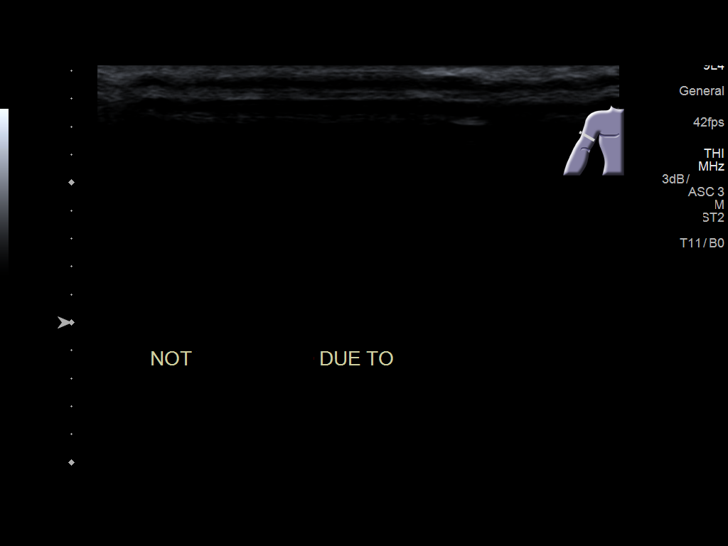
[im 28/34]
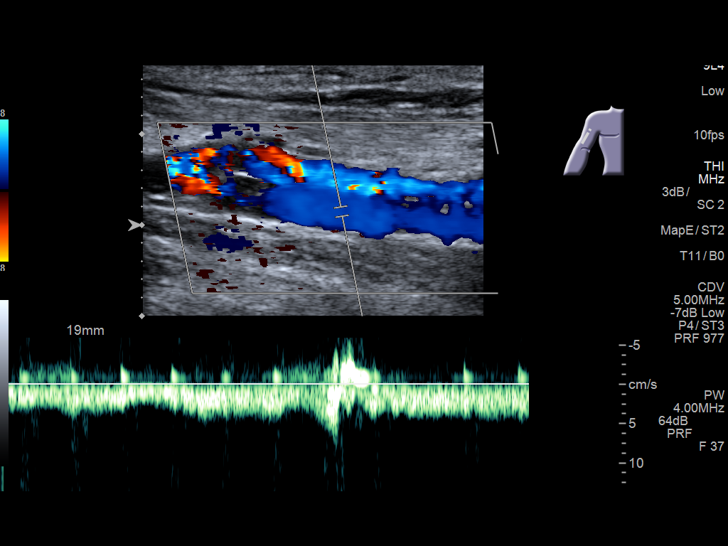
[im 31/34]
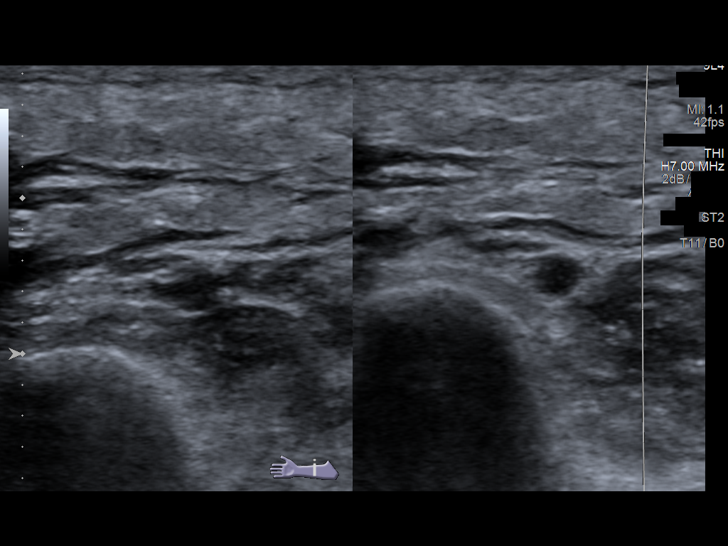
[im 34/34]
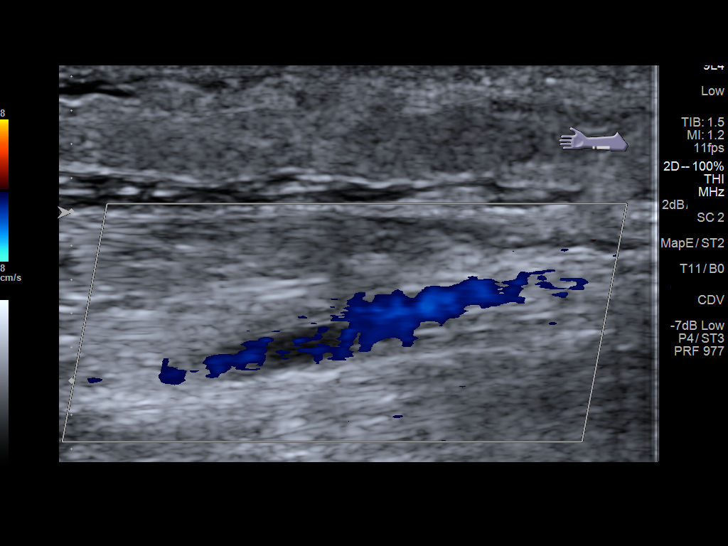

[13 of 24 positions shown; findings below may reference images not displayed]

FINDINGS: The mid aspect of the brachial veins was not imaged secondary to
overlying bandage associated with right upper extremity approach
PICC line.

Contralateral Subclavian Vein: Respiratory phasicity is normal and
symmetric with the symptomatic side. No evidence of thrombus. Normal
compressibility.

Internal Jugular Vein: No evidence of thrombus. Normal
compressibility, respiratory phasicity and response to augmentation.

Subclavian Vein: No evidence of thrombus. Normal compressibility,
respiratory phasicity and response to augmentation. The mid aspect
of the right upper extremity approach PICC line is seen within the
subclavian vein without associated pericatheter thrombus
(representative image 9).

Axillary Vein: No evidence of thrombus. Normal compressibility,
respiratory phasicity and response to augmentation.

Cephalic Vein: No evidence of thrombus. Normal compressibility,
respiratory phasicity and response to augmentation.

Basilic Vein: No evidence of thrombus. Normal compressibility,
respiratory phasicity and response to augmentation.

Brachial Veins: No evidence of thrombus. Normal compressibility,
respiratory phasicity and response to augmentation.

Radial Veins: No evidence of thrombus. Normal compressibility,
respiratory phasicity and response to augmentation.

Ulnar Veins: No evidence of thrombus. Normal compressibility,
respiratory phasicity and response to augmentation.

Other Findings: There is a moderate amount of subcutaneous edema at
the level of the upper arm and forearm.
IMPRESSION: No evidence of DVT within the right upper extremity.

## 2018-12-03 ENCOUNTER — Other Ambulatory Visit: Payer: Medicaid Other | Admitting: Primary Care

## 2018-12-03 ENCOUNTER — Other Ambulatory Visit: Payer: Self-pay

## 2018-12-03 DIAGNOSIS — Z515 Encounter for palliative care: Secondary | ICD-10-CM

## 2018-12-03 IMAGING — DX DG CHEST 1V PORT
1 series · 1 of 1 positions shown · non-contrast
Comparison: June 29, 2017

CLINICAL DATA: Shortness of breath

EXAM:
PORTABLE CHEST 1 VIEW

[chest ap]
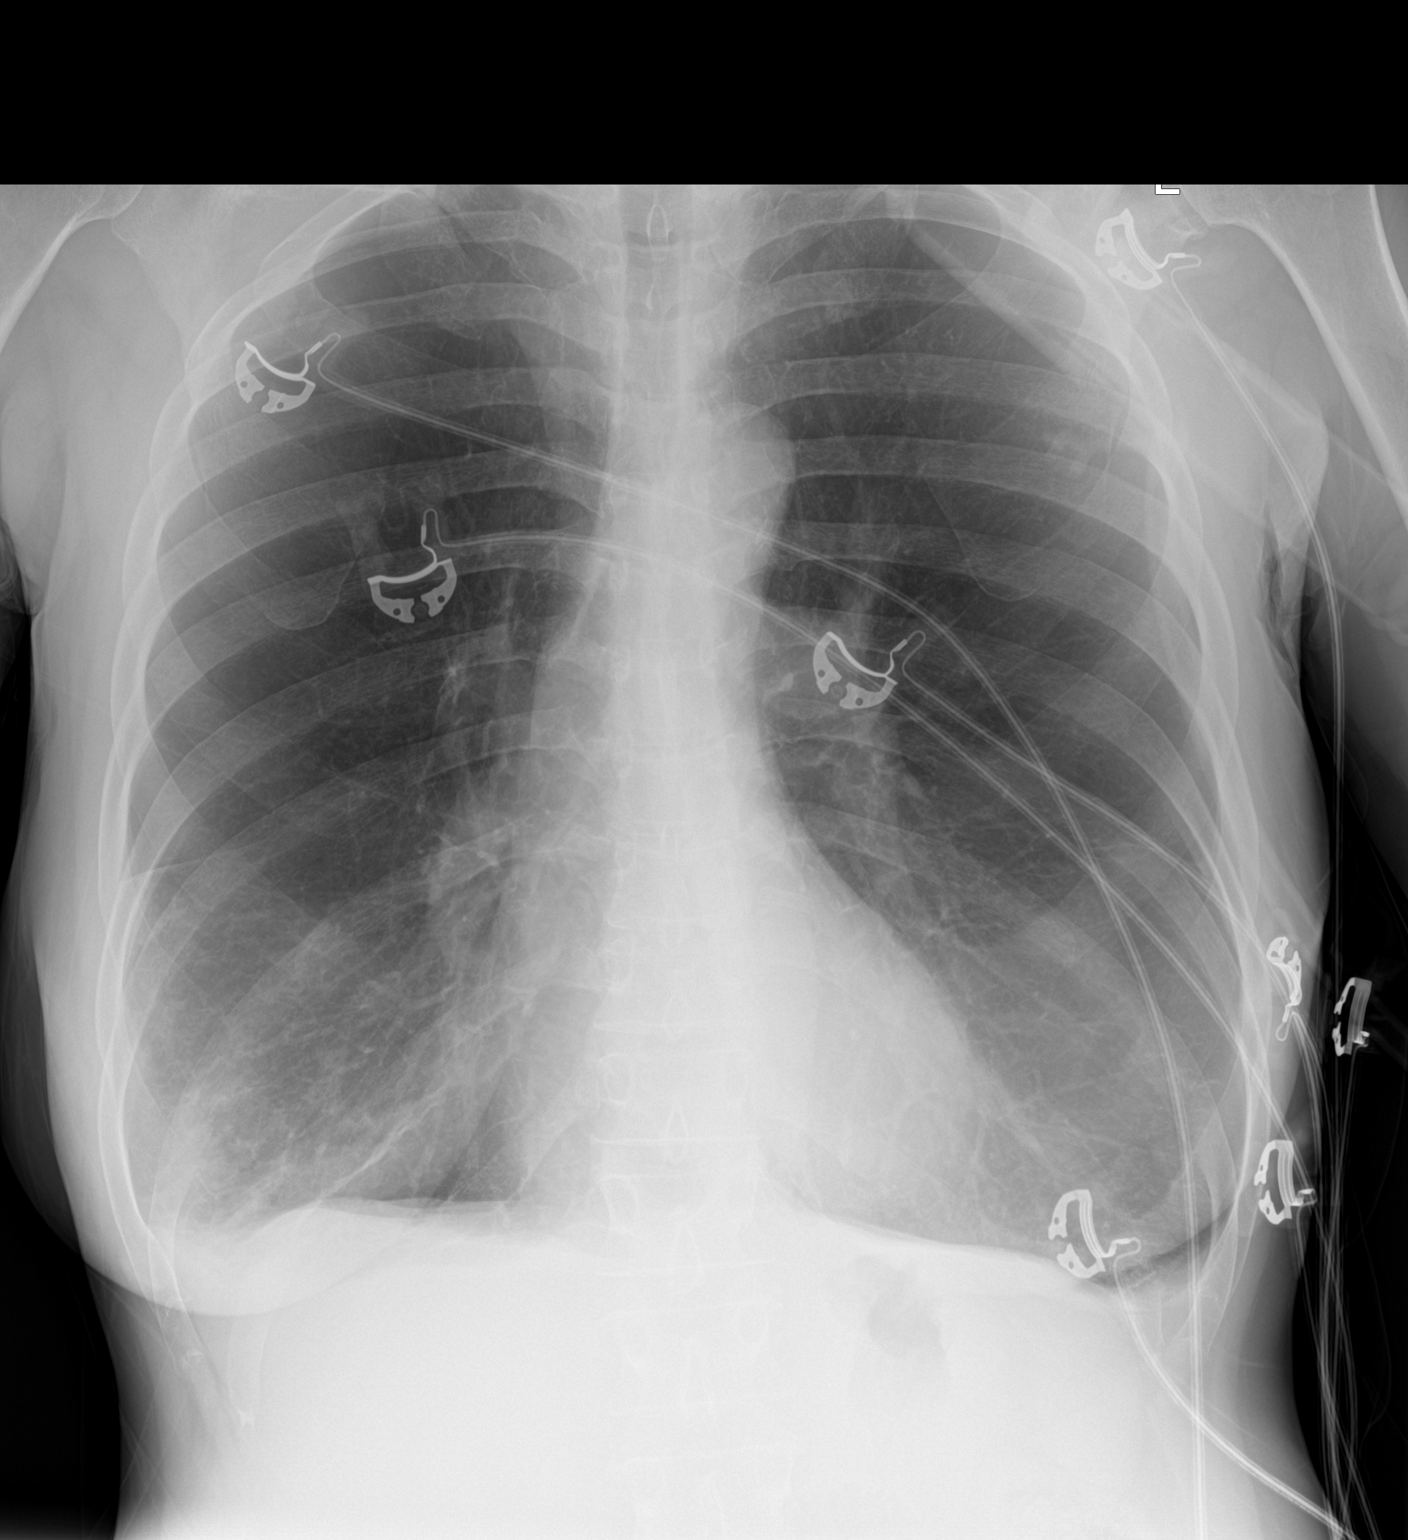

[1 of 1 positions shown; findings below may reference images not displayed]

FINDINGS: The heart size and mediastinal contours are within normal limits.
The lungs are hyperinflated. There are small bilateral pleural
effusions. There is no focal infiltrate or pulmonary edema. The
visualized skeletal structures are unremarkable.
IMPRESSION: Small bilateral pleural effusions.  No focal pneumonia.

COPD.

## 2018-12-03 NOTE — Progress Notes (Signed)
[facility] Consult Note Telephone: [phone]  Fax: [phone]   PATIENT NAME: Michelle [name] DOB: [birth year] MRN: [id number]  PRIMARY CARE PROVIDER:   Frazier Richards, MD [phone]  REFERRING PROVIDER:  Frazier Richards, [place],  [place] [address] [phone]  RESPONSIBLE PARTY:   Extended Emergency Contact Information Primary Emergency Contact: [place], [place] Phone: [phone] Mobile Phone: [phone] Relation: Daughter Secondary Emergency Contact: Elizabeth Sauer Address: [address] [place], [place] [address] Johnnette Litter of [place] Phone: [phone] Mobile Phone: [phone] Relation: Daughter  Palliative Care was asked to follow this patient by consultation request of Frazier Richards, MD. This is a follow up visit. Home visit made with patient, daughter [name] and myself present.   ASSESSMENT AND RECOMMENDATIONS:   1. Goals of Care: Maximize quality of life and symptom management.  2. Symptom Management:   Dyspnea: Reports none, home from second of 2 respiratory hospitalizations in as many months. Appears non distressed and appears at baseline. Uses Trilogy ventilator in home with tracheostomy.  Depression/anxiety: Concern from last month may have been respiratory infection daughter thinks in hindsight. Pt mood has improved.  Debility: Has home health working with patient on transfers, etc. Spends most of her time in bed. Encouraged to get oob daily.   Skin: Reports shear on  Sacrum, instructed to have caregivers not push bedpan under her but roll side to side for placement. Also asked them to request fracture pan.  3. Family /Caregiver/Community Supports: Has nursing services for day time hours. Family is home other times. Daughter [name] works at night and is home sleeping during the day, prefers afternoon visits.   4. Cognitive / Functional decline: Appears to be at  baseline, pt is appropriate and oriented, able to participate fully in interview. Encouraged to advance function by getting oob daily to improved stamina.   5. Advanced Care Directive: MOST form given, daughter states she has been working on [employer]. I will discuss on next visit and they will review the MOST form in advance of that. Will continue POA discussion.  Does have DNR in home dated 11/05/2018 after last hospital stay. I scanned and uploaded to [facility] system.  6. Follow up Palliative Care Visit: Palliative care will continue to follow for goals of care clarification and symptom management. Return 4-6 weeks or prn.  I spent 60 minutes providing this consultation,  from 1530 to 1630. More than 50% of the time in this consultation was spent coordinating communication.   HISTORY OF PRESENT ILLNESS:  Michelle [name] is a 58 y.o. year old female with multiple medical problems including COPD, CHF, CAD, asthma, HTN, tobacco abuse, tracheostomy, g tube placement. Palliative Care was asked to help address goals of care.   CODE STATUS:  DNR  PPS: 30% HOSPICE ELIGIBILITY/DIAGNOSIS: TBD  PAST MEDICAL HISTORY:  Past Medical History:  Diagnosis Date  . Allergy   . Anxiety   . Asthma   . CHF (congestive heart failure) ([place])   . Cocaine abuse ([place])   . COPD (chronic obstructive pulmonary disease) ([place])   . Coronary artery disease   . Emphysema/COPD ([place])   . Hepatitis C   . High cholesterol   . Hypertension   . Pneumothorax   . Tobacco abuse     SOCIAL HX:  Social History   Tobacco Use  . Smoking status: Current Every Day Smoker  Packs/day: 0.10    Years: 41.00    Pack years: 4.10    Types: Cigarettes  . Smokeless tobacco: Never Used  Substance Use Topics  . Alcohol use: No    Frequency: Never    ALLERGIES: No Known Allergies   PERTINENT MEDICATIONS:  Outpatient Encounter Medications as of 12/03/2018  Medication Sig  . AMBULATORY NON FORMULARY MEDICATION Medication Name: Pulse  oximeter DME:AHC DX J44.9....J96.21  . aspirin 81 MG chewable tablet Chew 81 mg by mouth at bedtime.   . budesonide (PULMICORT) 0.25 MG/2ML nebulizer solution Take 2 mLs (0.25 mg total) by nebulization 2 (two) times daily.  . cetirizine (ZYRTEC) 10 MG tablet Place 10 mg into feeding tube daily.   . clonazePAM (KLONOPIN) 0.5 MG tablet Place 1 tablet (0.5 mg total) into feeding tube daily. (Patient taking differently: Place 1 mg into feeding tube 2 (two) times daily. (may also take as needed for anxiety))  . famotidine (PEPCID) 20 MG tablet Place 1 tablet (20 mg total) into feeding tube daily.  . fentaNYL (DURAGESIC - DOSED MCG/HR) 25 MCG/HR patch Place 1 patch (25 mcg total) onto the skin every 3 (three) days.  . fluticasone (FLONASE) 50 MCG/ACT nasal spray Place 1 spray into both nostrils daily.   Marland Kitchen ipratropium-albuterol (DUONEB) 0.5-2.5 (3) MG/3ML SOLN Take 3 mLs by nebulization every 6 (six) hours. (Patient taking differently: Take 3 mLs by nebulization every 6 (six) hours as needed (for shortness of breath). )  . mirtazapine (REMERON) 30 MG tablet Take 30 mg by mouth at bedtime.  Marland Kitchen oxyCODONE-acetaminophen (PERCOCET/ROXICET) 5-325 MG tablet Place 1 tablet into feeding tube every 8 (eight) hours as needed for severe pain.  Marland Kitchen PARoxetine (PAXIL) 20 MG tablet Place 1 tablet (20 mg total) into feeding tube at bedtime.  . polyethylene glycol (MIRALAX / GLYCOLAX) 17 g packet Place 17 g into feeding tube daily.  . predniSONE (DELTASONE) 20 MG tablet Take 2 tablets (40 mg total) by mouth daily with breakfast.  . QUEtiapine (SEROQUEL) 25 MG tablet Place 1 tablet (25 mg total) into feeding tube at bedtime.  . senna (SENOKOT) 8.6 MG TABS tablet Place 1-2 tablets into feeding tube at bedtime.   . simvastatin (ZOCOR) 20 MG tablet Take 40 mg by mouth every evening.    No facility-administered encounter medications on file as of 12/03/2018.     PHYSICAL EXAM/ROS:   Current and past weights, 164 lb. Per  report General: NAD, WNWD Cardiovascular: no chest pain reported, no edema, S1S2 reg rate Pulmonary: Lungs clear but diminished, no cough, no increased SOB, had another hospitalization for pneumonia 3 weeks ago. Has trilogy connected  via tracheostomy. Uses nebulizer as needed. Abdomen: appetite fair, meds/feeds via g tube, endorses some  constpation, continent of bowel, G tube with black dots, family is concerned it's mold or mildew. Referred to gi to f/u. Site intact. GU: Denies dysuria, continent of urine MSK:  no joint deformities, walks short distances with walker and assistance Skin: Has sacral shear from bed pan, I requested she ask home health agency for a fracture pan and roll on and off of it.  Neurological: Weakness, A and O x 3. Denies new deficits.  Cyndia Skeeters DNP, AGPCNP-BC

## 2018-12-06 IMAGING — CT CT ANGIO CHEST
2 of 6 series · 18 of 46 positions shown · IV contrast (APPLIED)
Comparison: 12/01/2015 and 11/04/2013

CLINICAL DATA: Persistent shortness of breath on exertion with
cough.

EXAM:
CT ANGIOGRAPHY CHEST WITH CONTRAST
TECHNIQUE: Multidetector CT imaging of the chest was performed using the
standard protocol during bolus administration of intravenous
contrast. Multiplanar CT image reconstructions and MIPs were
obtained to evaluate the vascular anatomy.
CONTRAST:  75mL TSC306-HOD IOPAMIDOL (TSC306-HOD) INJECTION 76%

[Series 5: thins · axial · 0.60mm/px · z∈[-253,+9]mm · 16 of 288 slices shown]
[im 13/288  lung]
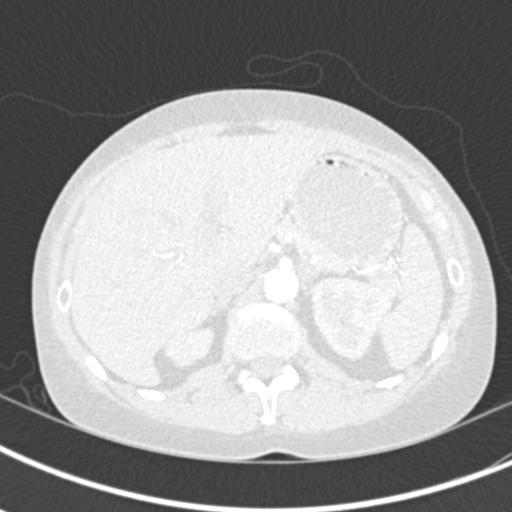
[im 38/288  soft-tissue]
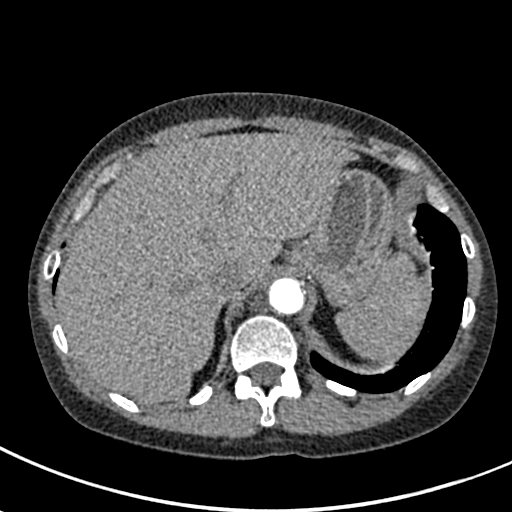
[im 50/288  lung]
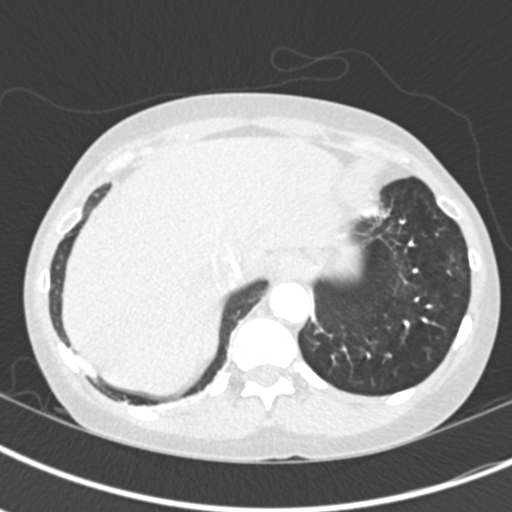
[im 63/288  soft-tissue]
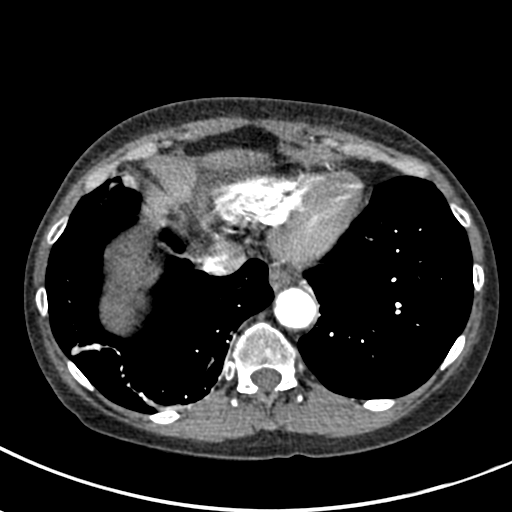
[im 88/288  lung]
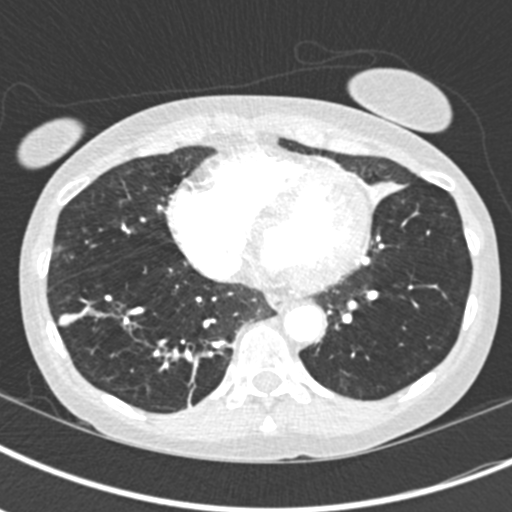
[im 100/288  soft-tissue]
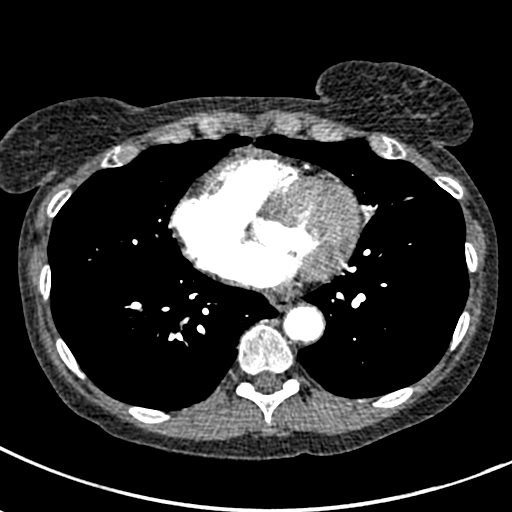
[im 113/288  lung]
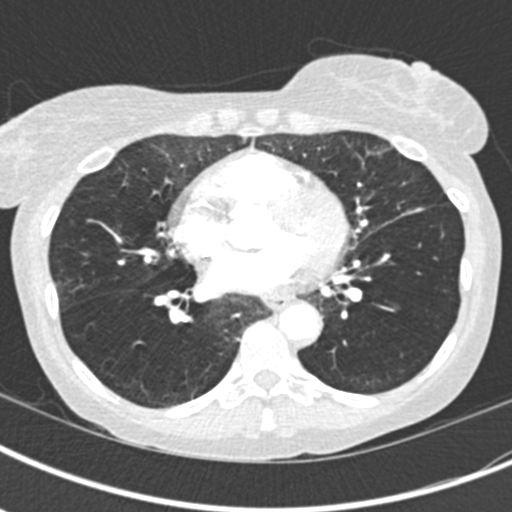
[im 138/288  soft-tissue]
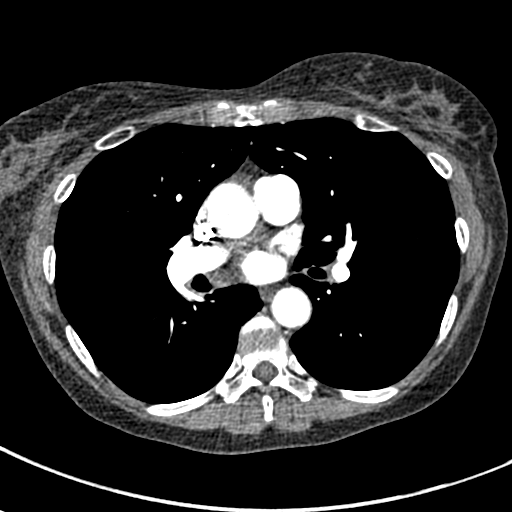
[im 150/288  lung]
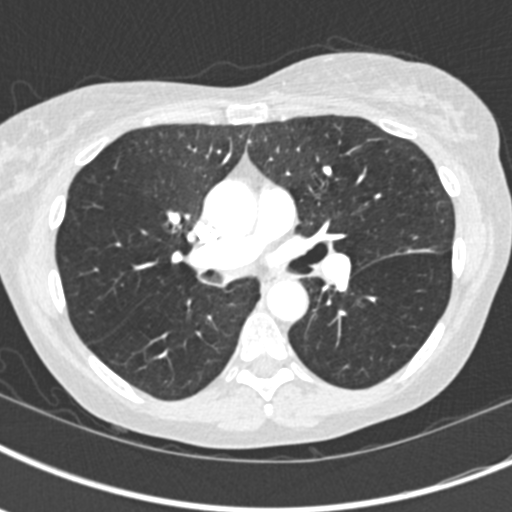
[im 175/288  soft-tissue]
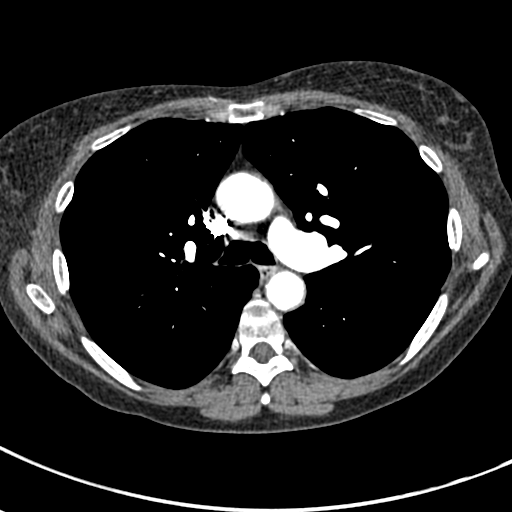
[im 188/288  lung]
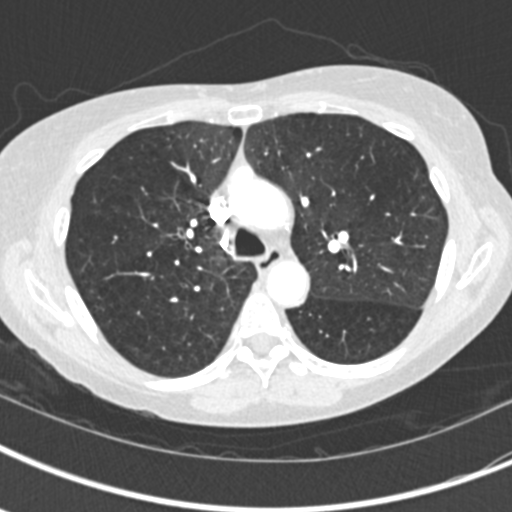
[im 200/288  soft-tissue]
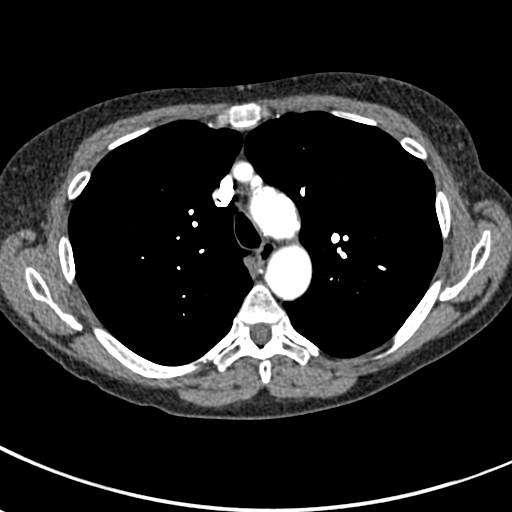
[im 225/288  lung]
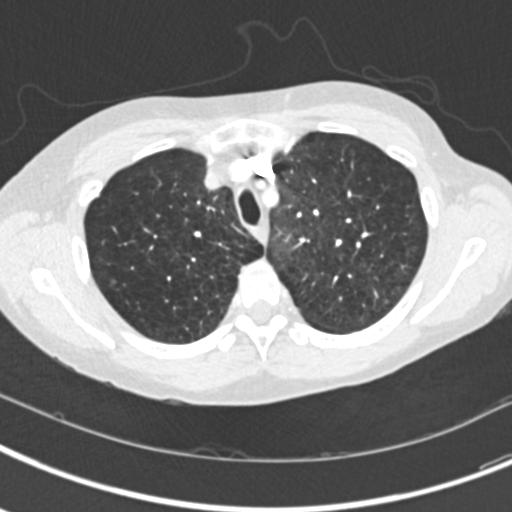
[im 238/288  soft-tissue]
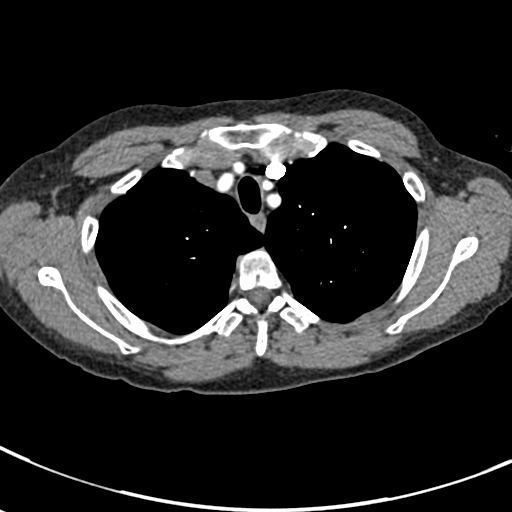
[im 250/288  lung]
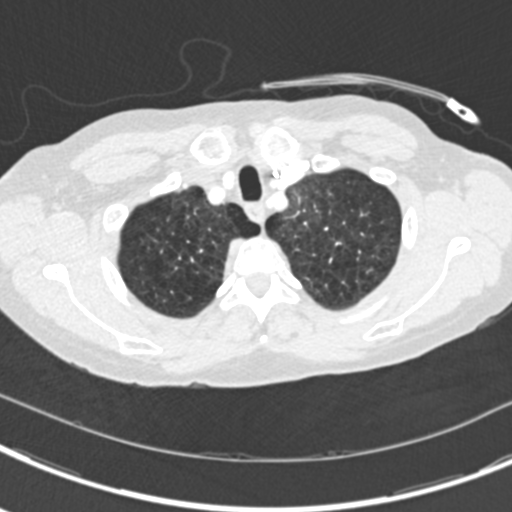
[im 275/288  soft-tissue]
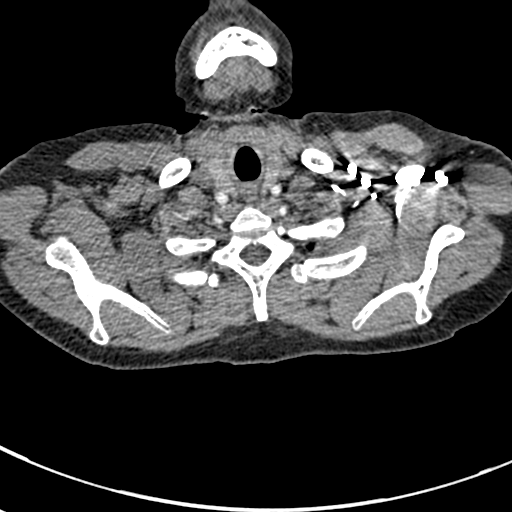

[Series 7: coronal mpr · coronal · 0.56mm/px · 2 of 75 slices shown]
[im 25/75  soft-tissue]
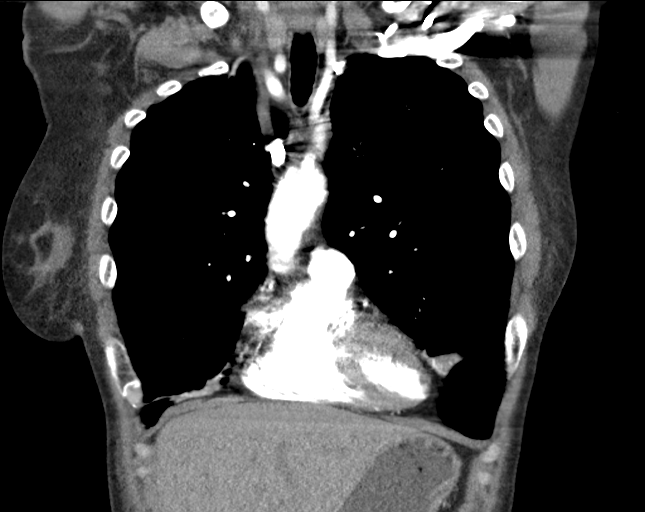
[im 50/75  soft-tissue]
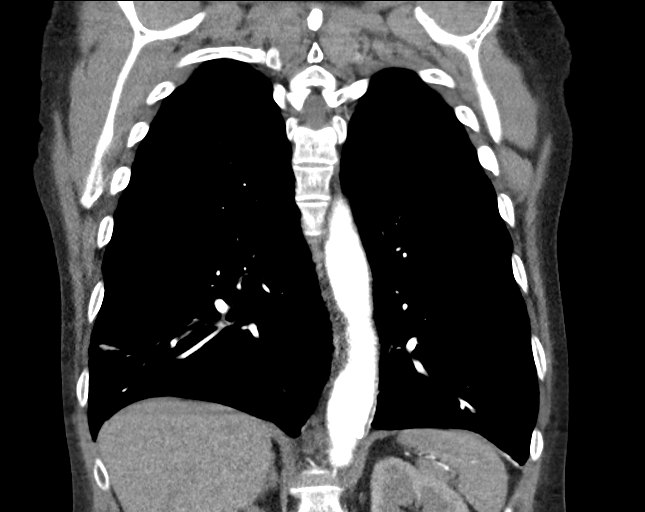

[18 of 46 positions shown; findings below may reference images not displayed]

FINDINGS: Cardiovascular: Heart is normal size. Minimal calcified plaque over
the left anterior descending coronary artery. Suggestion of a stent
versus calcification over the lateral circumflex coronary artery.
Minimal calcified plaque over the thoracic aorta. No evidence of
pulmonary embolism.

Mediastinum/Nodes: No mediastinal or hilar adenopathy. Remaining
mediastinal structures are within normal.

Lungs/Pleura: Lungs are well inflated with stable linear density
over the right base compatible with scarring. Subtle patchy density
over the inferolateral aspect of the right middle lobe which may be
due to an infectious, atypical infectious or inflammatory process.
No evidence of effusion. Possible minimal debris within the right
lower lobe bronchus.

Upper Abdomen: No acute abnormality.

Musculoskeletal: Minimal degenerate change of the spine.

Review of the MIP images confirms the above findings.
IMPRESSION: Subtle patchy density over the right middle lobe which may be due to
an infectious versus atypical infectious or inflammatory process.
Possible aspirate material over the right lower lobe bronchus.

No evidence of pulmonary embolism.

Aortic Atherosclerosis ([id number]). Atherosclerotic coronary
artery disease.

## 2018-12-08 ENCOUNTER — Encounter: Payer: Self-pay | Admitting: Internal Medicine

## 2018-12-08 ENCOUNTER — Other Ambulatory Visit: Payer: Self-pay

## 2018-12-08 ENCOUNTER — Telehealth: Payer: Self-pay | Admitting: Internal Medicine

## 2018-12-08 ENCOUNTER — Ambulatory Visit (INDEPENDENT_AMBULATORY_CARE_PROVIDER_SITE_OTHER): Payer: Medicaid Other | Admitting: Internal Medicine

## 2018-12-08 VITALS — BP 140/82 | HR 89 | Temp 97.4°F | Ht 61.0 in | Wt 164.0 lb

## 2018-12-08 DIAGNOSIS — J9611 Chronic respiratory failure with hypoxia: Secondary | ICD-10-CM | POA: Diagnosis not present

## 2018-12-08 DIAGNOSIS — J449 Chronic obstructive pulmonary disease, unspecified: Secondary | ICD-10-CM | POA: Diagnosis not present

## 2018-12-08 DIAGNOSIS — J9612 Chronic respiratory failure with hypercapnia: Secondary | ICD-10-CM | POA: Diagnosis not present

## 2018-12-08 MED ORDER — PERFOROMIST 20 MCG/2ML IN NEBU: 20.0000 ug | INHALATION_SOLUTION | Freq: Two times a day (BID) | RESPIRATORY_TRACT | 6 refills | Status: DC

## 2018-12-08 MED ORDER — PREDNISONE 20 MG PO TABS: 40.0000 mg | ORAL_TABLET | Freq: Every day | ORAL | 0 refills | Status: DC

## 2018-12-08 NOTE — Patient Instructions (Addendum)
CONTINUE  VENT AS PRESCRIBED CONTINUE OXYGEN AS PRESCRIBED CONTINUE NEBS AS PRESCRIBED  PREDNISONE 40 mg daily for 10 days

## 2018-12-08 NOTE — Telephone Encounter (Signed)
Called patient for COVID-19 pre-screening for in office visit. ° °Have you recently traveled any where out of the local area in the last 2 weeks? No ° °Have you been in close contact with a person diagnosed with COVID-19 or someone awaiting results within the last 2 weeks? No ° °Do you currently have any of the following symptoms? If so, when did they start? °Cough      Diarrhea   Joint Pain °Fever      Muscle Pain   Red eyes °Shortness of breath   Abdominal pain  Vomiting °Loss of smell    Rash    Sore Throat °Headache    Weakness   Bruising or bleeding ° °Okay to proceed with visit 12/08/2018 ° ° °

## 2018-12-08 NOTE — Progress Notes (Signed)
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 Inpatient swallow study however showed no concern for aspiration at [facility]  Patient eating foods by mouth without difficulty   Recurrent bouts of pneumonia Recent admission aspiration pneumonia and COPD exacerbation Pseudomonas (Intermediate to fluoroquinolones) and mixed gram positive/gram negative organisms.    Acute on Chronic Hypoxic Respiratory Failure  COPD: History of severe COPD, previously followed by [facility] pulm. Required tracheostomy 10/2017 after prolonged intubation for COPD exacerbation complicated by pneumonia and difficult vent wean. After failing trach collar trials was discharged with home ventilatory support.  home vent settings (A/C: 16/450/+5/30%). Patient remained stable on such through duration of hospital course. Lurline Idol exchanged on 6/1 and will be due again in July.     CAD: Had MI in 2009, though prior records indicate in setting of cocaine use. Continued home ASA and statin.   G-tube: At time of trach placement. Takes meds via G-tube but eating soft foods by mouth prior to admission. Per discussion as above with patient cleared for full diet after MBS with recent  admission.    Oxygen Yes Mode: ventilator   Vent Settings Vent Mode: PRVC FiO2 (%): 40 % S RR: 16 S VT: 450 mL PEEP: 5 cm H20   Overall prognosis is poor due to end-stage respiratory disease And has debilitating chronic respiratory failure   PAST MEDICAL HISTORY :   has a past medical history of Allergy, Anxiety, Asthma, CHF (congestive heart failure) ([place]), Cocaine abuse ([place]), COPD (chronic obstructive pulmonary disease) ([place]), Coronary artery disease, Emphysema/COPD ([place]), Hepatitis C, High cholesterol, Hypertension, Pneumothorax, and Tobacco abuse.  has a past surgical history that includes Cardiac catheterization; Chest tube insertion; Tracheostomy tube placement (N/A, 10/17/2017); and IR GASTROSTOMY TUBE MOD SED (10/31/2017). Prior to Admission medications   Medication Sig Start Date End Date Taking? Authorizing Provider  AMBULATORY NON FORMULARY MEDICATION Medication Name: Pulse oximeter DME:[facility] DX J44.9....I34.74 01/08/18   Flora Lipps, MD  aspirin 81 MG chewable tablet Chew 81 mg by mouth at bedtime.     [provider]  budesonide (PULMICORT) 0.25 MG/2ML nebulizer solution Take 2 mLs (0.25 mg total) by nebulization 2 (two) times daily. 12/31/17   Conforti, John, DO  cetirizine (ZYRTEC) 10 MG tablet Place 10 mg into feeding tube daily.     [provider]  clonazePAM (KLONOPIN) 0.5 MG tablet Place 1 tablet (0.5 mg total) into feeding tube daily. Patient taking differently: Place 1 mg into feeding tube 2 (two) times daily. (may also take as needed for anxiety) 01/07/18   Flora Lipps, MD  famotidine (PEPCID) 20 MG tablet Place 1 tablet (20 mg total) into feeding tube daily. 12/31/17   Conforti, John, DO  fentaNYL (DURAGESIC - DOSED MCG/HR) 25 MCG/HR patch Place 1 patch (25 mcg total) onto the skin every 3 (three) days. 01/07/18   Flora Lipps, MD  fluticasone (FLONASE) 50 MCG/ACT nasal spray Place 1 spray into both nostrils daily.     [provider]  ipratropium-albuterol (DUONEB) 0.5-2.5 (3)  MG/3ML SOLN Take 3 mLs by nebulization every 6 (six) hours. Patient taking differently: Take 3 mLs by nebulization every 6 (six) hours as needed (for shortness of breath).  12/31/17   Conforti, John, DO  mirtazapine (REMERON) 30 MG tablet Take 30  mg by mouth at bedtime.    [provider]  oxyCODONE-acetaminophen (PERCOCET/ROXICET) 5-325 MG tablet Place 1 tablet into feeding tube every 8 (eight) hours as needed for severe pain. 01/07/18   Flora Lipps, MD  PARoxetine (PAXIL) 20 MG tablet Place 1 tablet (20 mg total) into feeding tube at bedtime. 12/31/17   Conforti, John, DO  polyethylene glycol (MIRALAX / GLYCOLAX) 17 g packet Place 17 g into feeding tube daily.    [provider]  predniSONE (DELTASONE) 20 MG tablet Take 2 tablets (40 mg total) by mouth daily with breakfast. 10/23/18   Awilda Bill, NP  QUEtiapine (SEROQUEL) 25 MG tablet Place 1 tablet (25 mg total) into feeding tube at bedtime. 12/31/17   Conforti, John, DO  senna (SENOKOT) 8.6 MG TABS tablet Place 1-2 tablets into feeding tube at bedtime.     [provider]  simvastatin (ZOCOR) 20 MG tablet Take 40 mg by mouth every evening.     [provider]   No Known Allergies  FAMILY HISTORY:  family history includes Asthma in her mother; Breast cancer (age of onset: 62) in her maternal aunt; CAD in her father; COPD in her sister; CVA in her father; Lung cancer in her mother; Stroke in her father. SOCIAL HISTORY:  reports that she has been smoking cigarettes. She has a 4.10 pack-year smoking history. She has never used smokeless tobacco. She reports current drug use. Drug: "Crack" cocaine. She reports that she does not drink alcohol.    Review of Systems:  Gen:  Denies  fever, sweats, chills weigh loss  HEENT: Denies blurred vision, double vision, ear pain, eye pain, hearing loss, nose bleeds, sore throat Cardiac:  No dizziness, chest pain or heaviness, chest tightness,edema, No JVD Resp:   + Shortness of breath, + wheezing,  Gi: Denies swallowing difficulty, stomach pain, nausea or vomiting, diarrhea, constipation, bowel incontinence Gu:  Denies bladder incontinence, burning urine Ext:   Denies Joint pain, stiffness or swelling Skin:  Denies  skin rash, easy bruising or bleeding or hives Endoc:  Denies polyuria, polydipsia , polyphagia or weight change Psych:   Denies depression, insomnia or hallucinations  Other:  All other systems negative     BP 140/82 (BP Location: Right Arm, Cuff Size: Normal)   Pulse 89   Temp (!) 97.4 F (36.3 C) (Temporal)   Ht 5\' 1"  (1.549 m)   Wt 164 lb (74.4 kg)   LMP 03/06/2005 (Approximate)   SpO2 99%   BMI 30.99 kg/m          Physical Examination:   GENERAL:NAD, no fevers, chills, no weakness no fatigue HEAD: Normocephalic, atraumatic.  EYES: PERLA, EOMI No scleral icterus.   NECK: Supple. No thyromegaly.  No JVD.  S/p trach PULMONARY: CTA B/L no wheezing, rhonchi, crackles CARDIOVASCULAR: S1 and S2. Regular rate and rhythm. No murmurs GASTROINTESTINAL: Soft, nontender, nondistended. Positive bowel sounds.  g tube in place MUSCULOSKELETAL: No swelling, clubbing, or edema.  NEUROLOGIC: No gross focal neurological deficits. 5/5 strength all extremities SKIN: No ulceration, lesions, rashes, or cyanosis.  PSYCHIATRIC: Insight, judgment intact. -depression -anxiety ALL OTHER ROS ARE NEGATIVE   MEDICATIONS: I have reviewed all medications and confirmed regimen as documented  ASSESSMENT AND PLAN SYNOPSIS  59 yo AAF with severe end stage COPD and end stage chronic hypoxic and hypercapnic resp failure with severe debilitating resp disease with ongoing wheezing, chronic vent dependent status post trach and status post G-tube   Chronic hypercapnic respiratory failure from COPD Continue ventilatory support as prescribed Patient needs a ventilator to survive Uses and benefits from therapy   Chronic hypoxic respiratory failure from COPD oxygen as prescribed  End-stage COPD Continue Pulmicort nebs twice daily Continue duo nebs every 6 Will add long-acting beta agonist therapy nebulizer Perforomist   COPD exacerbation with wheezing Prednisone 40 mg daily  for 10 days   COVID-19 EDUCATION: The signs and symptoms of COVID-19 were discussed with the patient and how to seek care for testing.  The importance of social distancing was discussed today. Hand Washing Techniques and avoid touching face was advised.  MEDICATION ADJUSTMENTS/LABS AND TESTS ORDERED: PERFOROMIST  NEBS CURRENT MEDICATIONS REVIEWED AT LENGTH WITH PATIENT TODAY   Patient/Family are satisfied with Plan of action and management. All questions answered Follow up in 6 months   Lorrene Graef Patricia Pesa, M.D.  [facility]  Medical Director [place] Director [facility] Cardio-Pulmonary Department

## 2019-01-13 IMAGING — DX DG CHEST 1V PORT
1 series · 1 of 1 positions shown · non-contrast
Comparison: CT scan August 23, 2017.  Chest x-ray August 20, 2017.

CLINICAL DATA: Shortness of breath.

EXAM:
PORTABLE CHEST 1 VIEW

[chest ap]
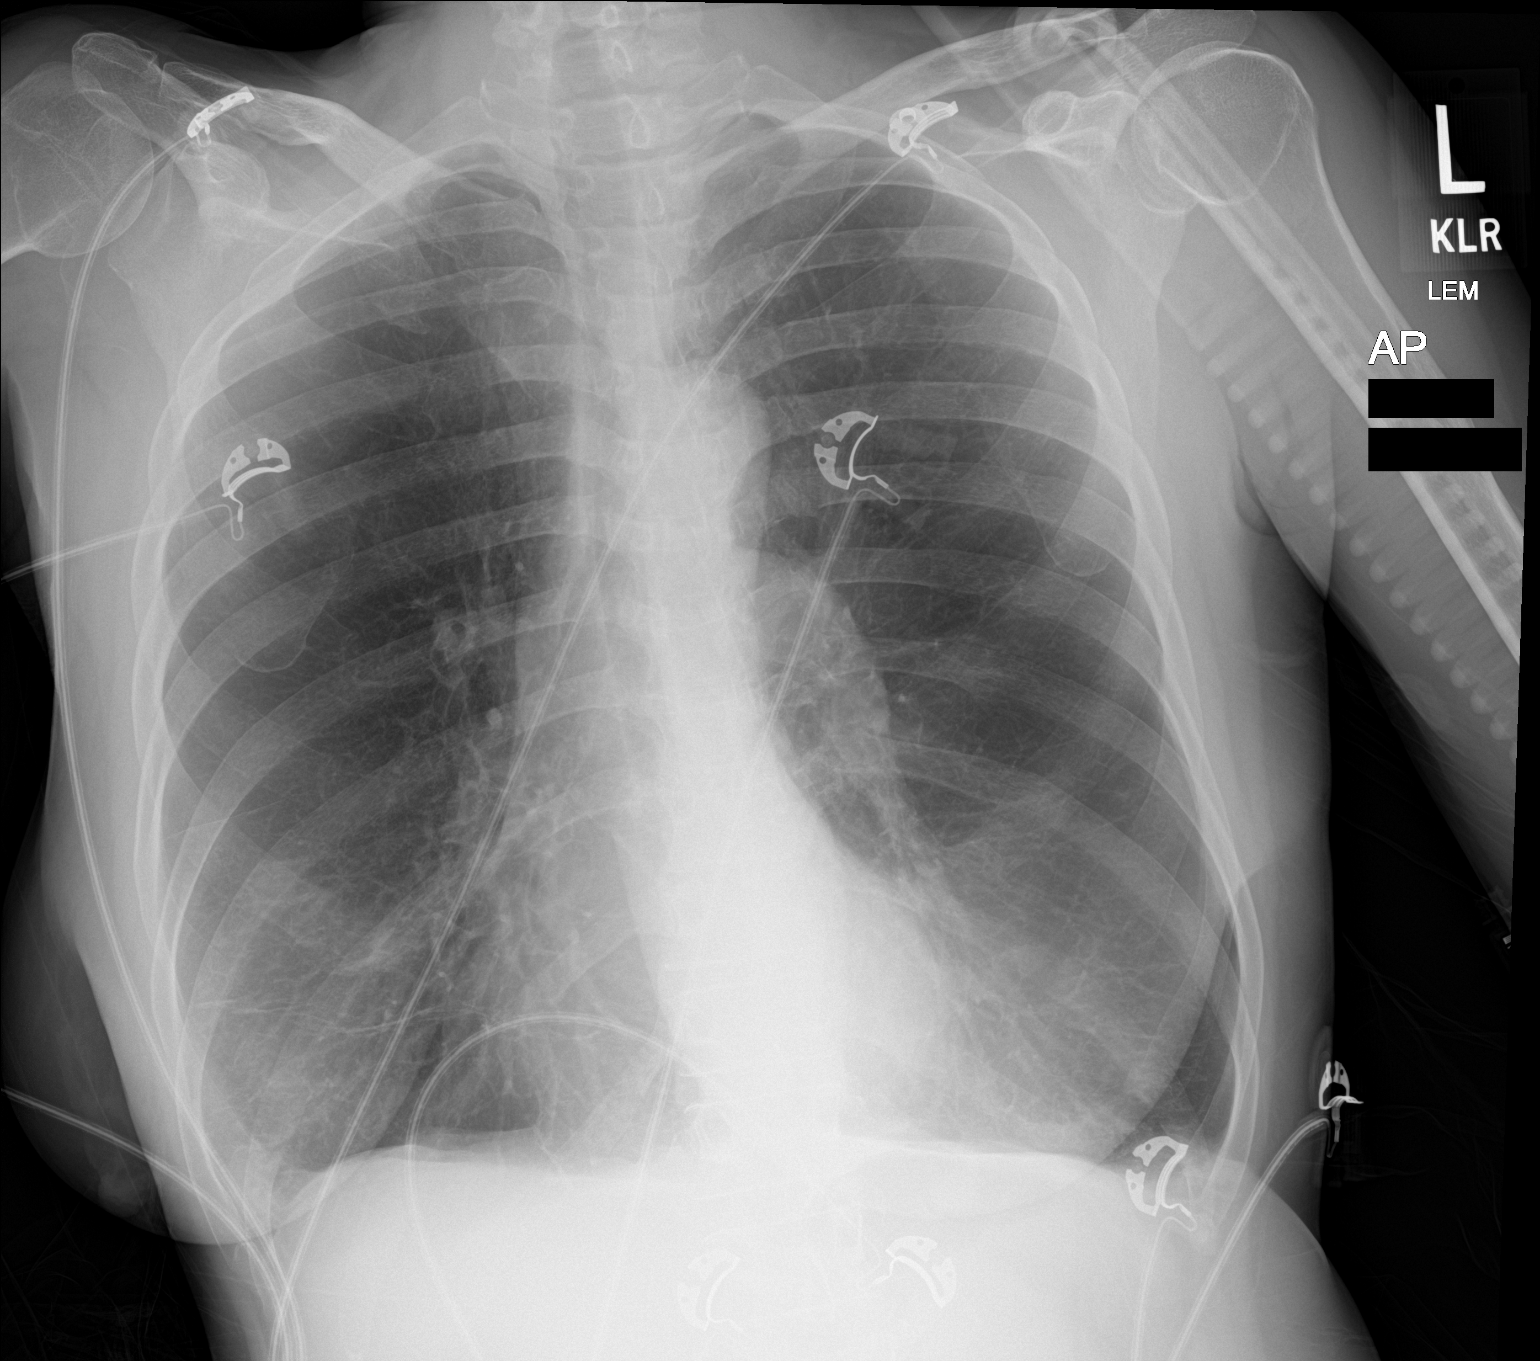

[1 of 1 positions shown; findings below may reference images not displayed]

FINDINGS: Hyperexpansion of the lungs consistent with emphysema. Tiny pleural
effusions and underlying atelectasis. No other interval changes.
IMPRESSION: Emphysema.  Tiny pleural effusions and underlying atelectasis.

## 2019-01-13 IMAGING — DX DG CHEST 1V PORT
1 series · 1 of 1 positions shown · non-contrast
Comparison: Earlier today

CLINICAL DATA: Orogastric tube placement

EXAM:
PORTABLE CHEST 1 VIEW

[chest ap]
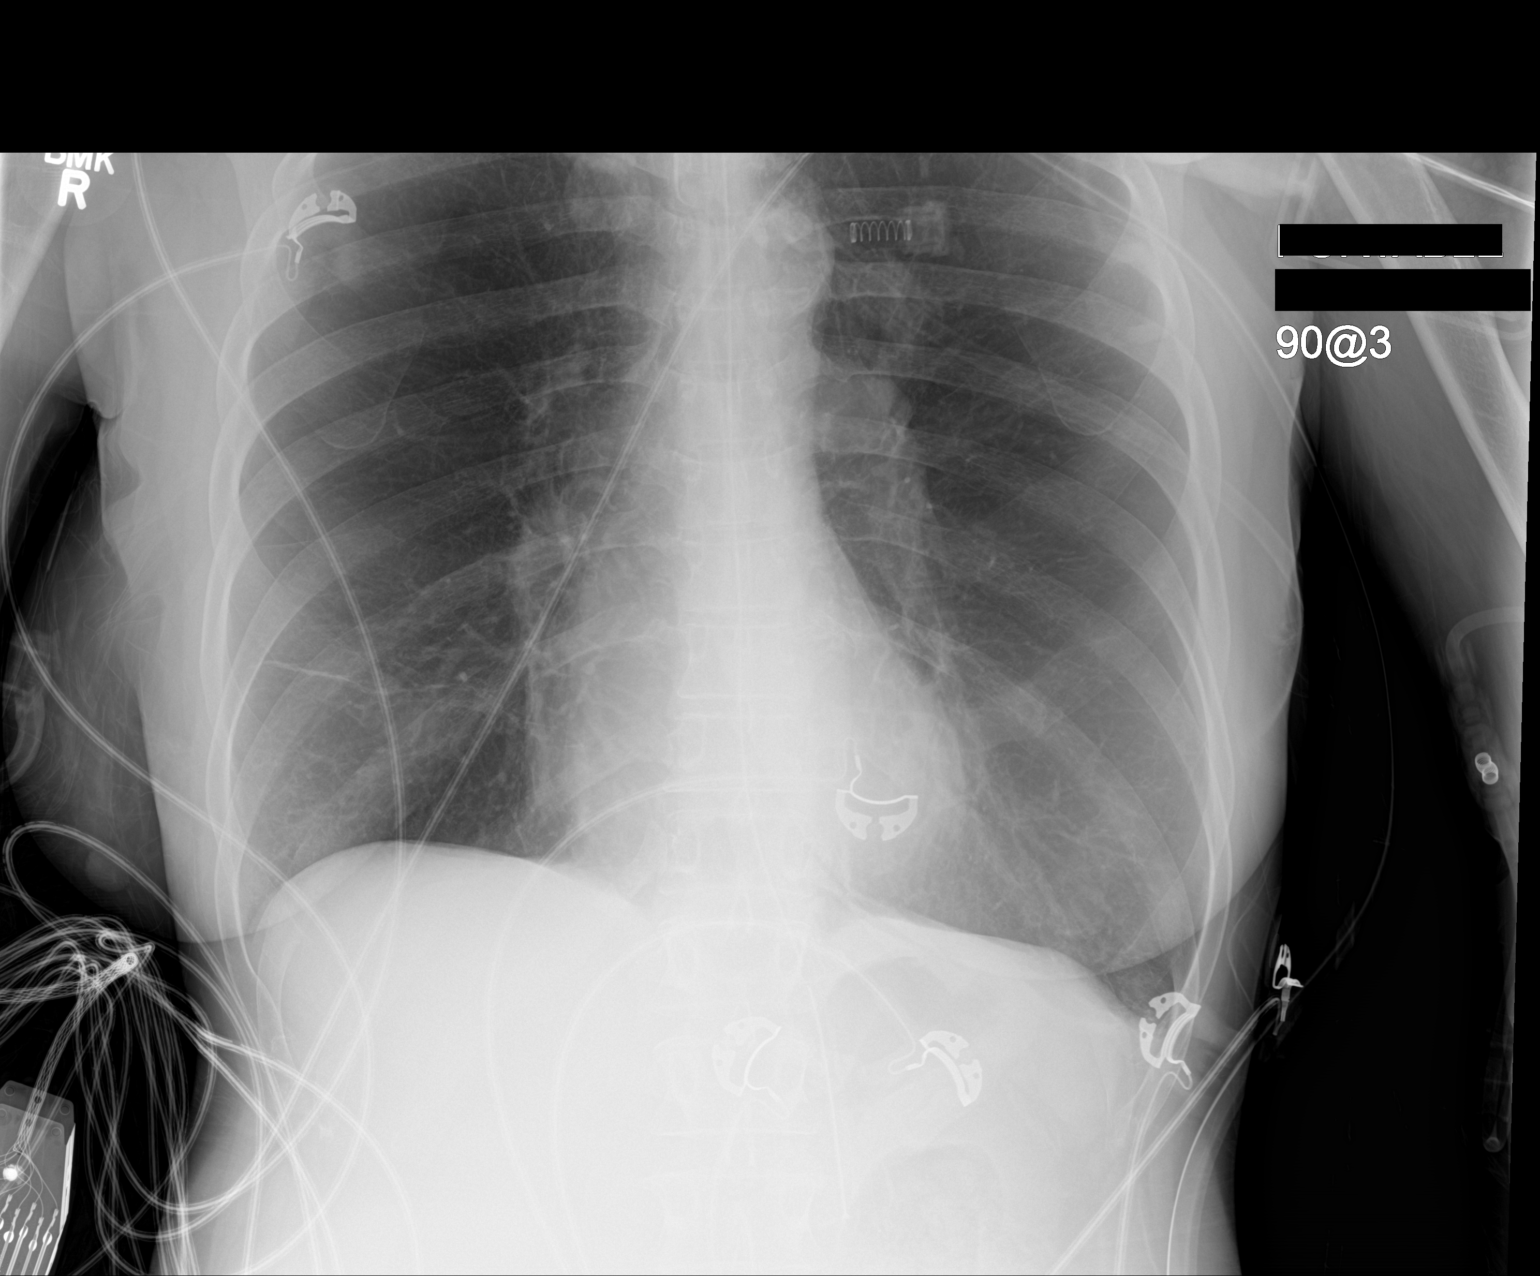

[1 of 1 positions shown; findings below may reference images not displayed]

FINDINGS: An orogastric tube tip reaches the stomach. The side port is near
the GE junction.

Endotracheal tube tip between the clavicular heads and carina.

Large lung volumes with lucent appearance. There is no edema,
consolidation, effusion, or pneumothorax. Normal heart size and
mediastinal contours.

Artifact from EKG leads.
IMPRESSION: The new orogastric tube is in good position. Otherwise stable from
earlier today.

## 2019-01-13 IMAGING — DX DG CHEST 1V PORT
1 series · 1 of 1 positions shown · non-contrast
Comparison: September 30, 2017

CLINICAL DATA: Status post intubation.

EXAM:
PORTABLE CHEST 1 VIEW

[chest ap]
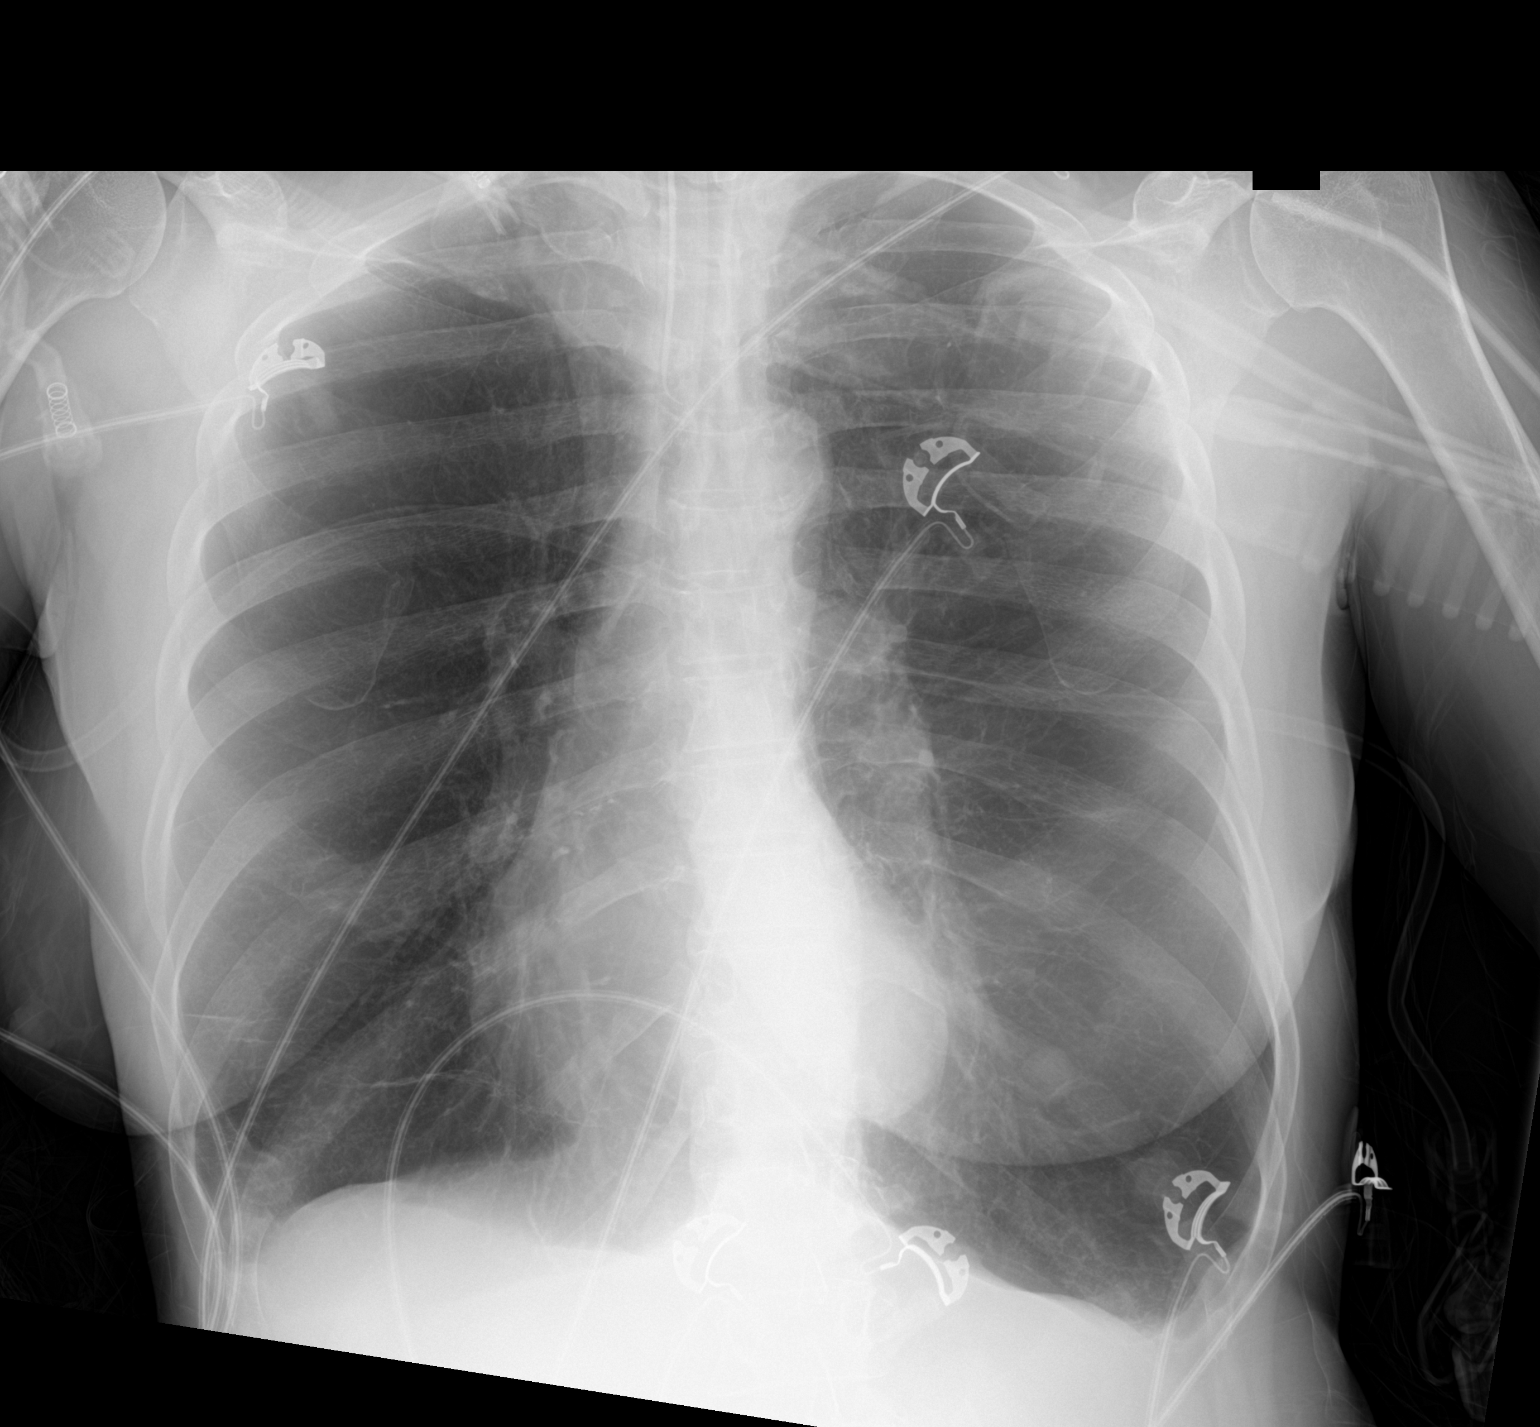

[1 of 1 positions shown; findings below may reference images not displayed]

FINDINGS: The ETT is in good position. No pneumothorax. Tiny effusions with
atelectasis. No other changes. Emphysema.
IMPRESSION: 1. The ETT is in good position.  No other changes.

## 2019-01-15 IMAGING — DX DG CHEST 1V PORT
1 series · 1 of 1 positions shown · non-contrast
Comparison: September 30, 2017

CLINICAL DATA: Hypoxia

EXAM:
PORTABLE CHEST 1 VIEW

[chest ap]
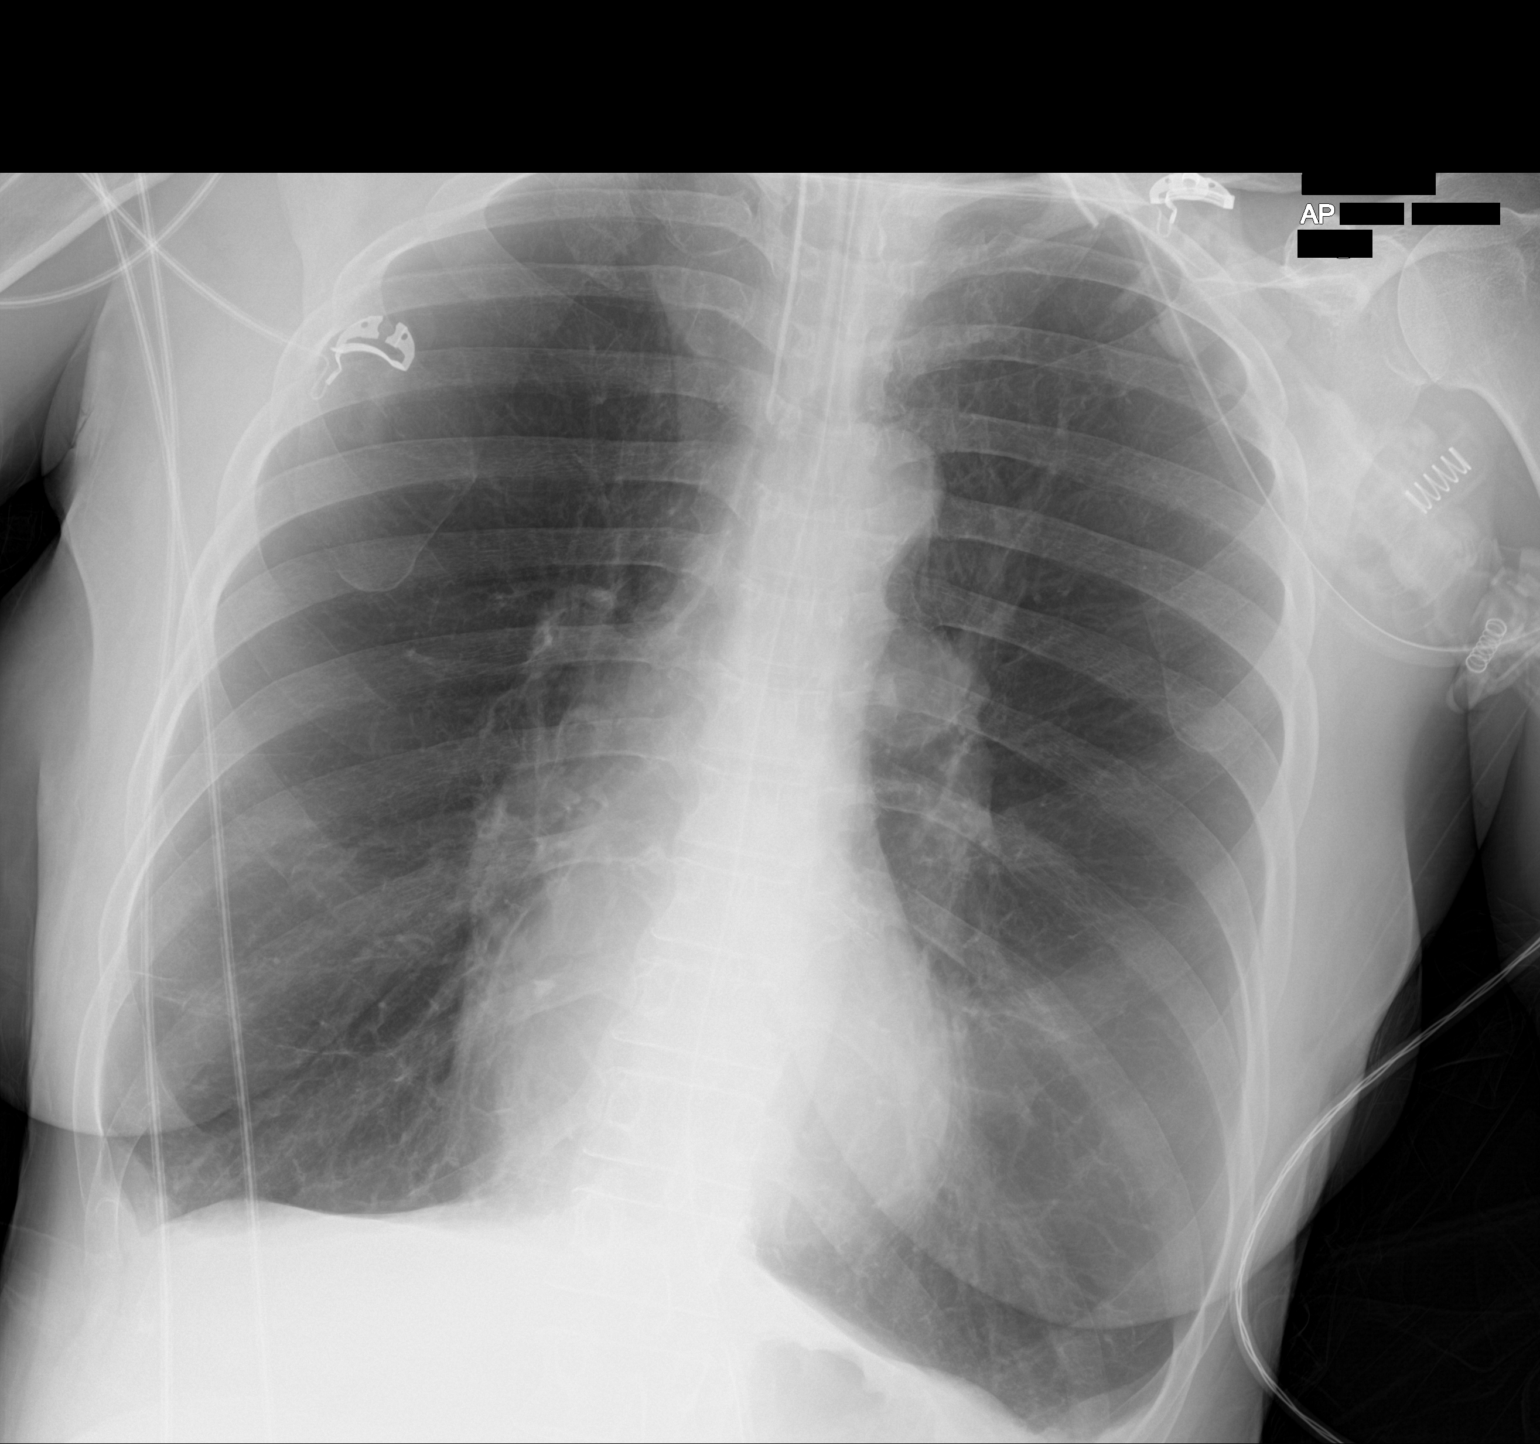

[1 of 1 positions shown; findings below may reference images not displayed]

FINDINGS: Endotracheal tube tip is 4.5 cm above the carina. Nasogastric tube
tip and side port are below the diaphragm. No pneumothorax. Lungs
are hyperexpanded. There is no edema or consolidation. The heart
size is normal. Pulmonary vascularity is within normal limits. There
is aortic atherosclerosis. No adenopathy. No bone lesions.
IMPRESSION: Tube positions as described without pneumothorax. Lungs
hyperexpanded without edema or consolidation. Stable cardiac
silhouette. There is aortic atherosclerosis.

Aortic Atherosclerosis ([id number]).

## 2019-01-16 IMAGING — DX DG CHEST 1V PORT
1 series · 1 of 1 positions shown · non-contrast
Comparison: 10/02/2017

CLINICAL DATA: Atelectasis

EXAM:
PORTABLE CHEST 1 VIEW

[chest ap]
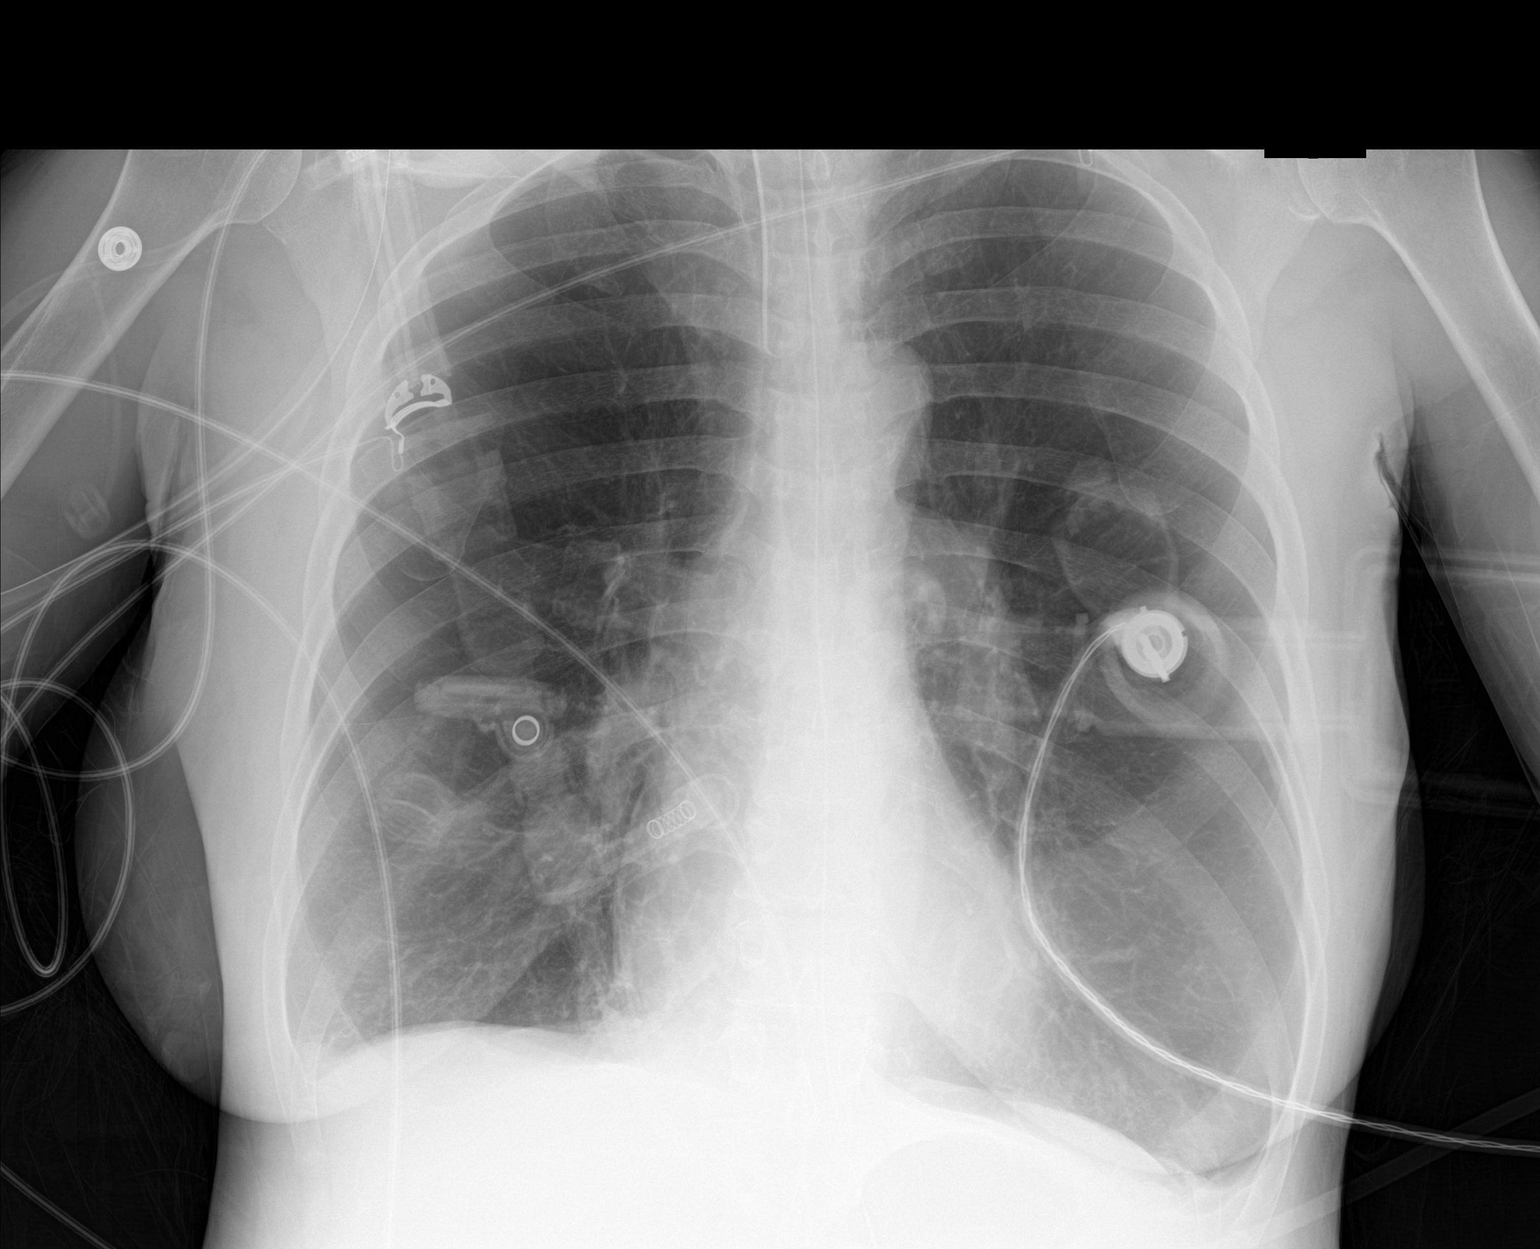

[1 of 1 positions shown; findings below may reference images not displayed]

FINDINGS: Endotracheal tube in good position.  NG tube in the stomach.

COPD with hyperinflation. Negative for infiltrate effusion or edema.
IMPRESSION: Endotracheal tube in good position.

COPD.  The lungs remain clear.

## 2019-01-17 IMAGING — DX DG CHEST 1V PORT
1 series · 1 of 1 positions shown · non-contrast
Comparison: 10/03/2017

CLINICAL DATA: Pneumonia

EXAM:
PORTABLE CHEST 1 VIEW

[chest ap]
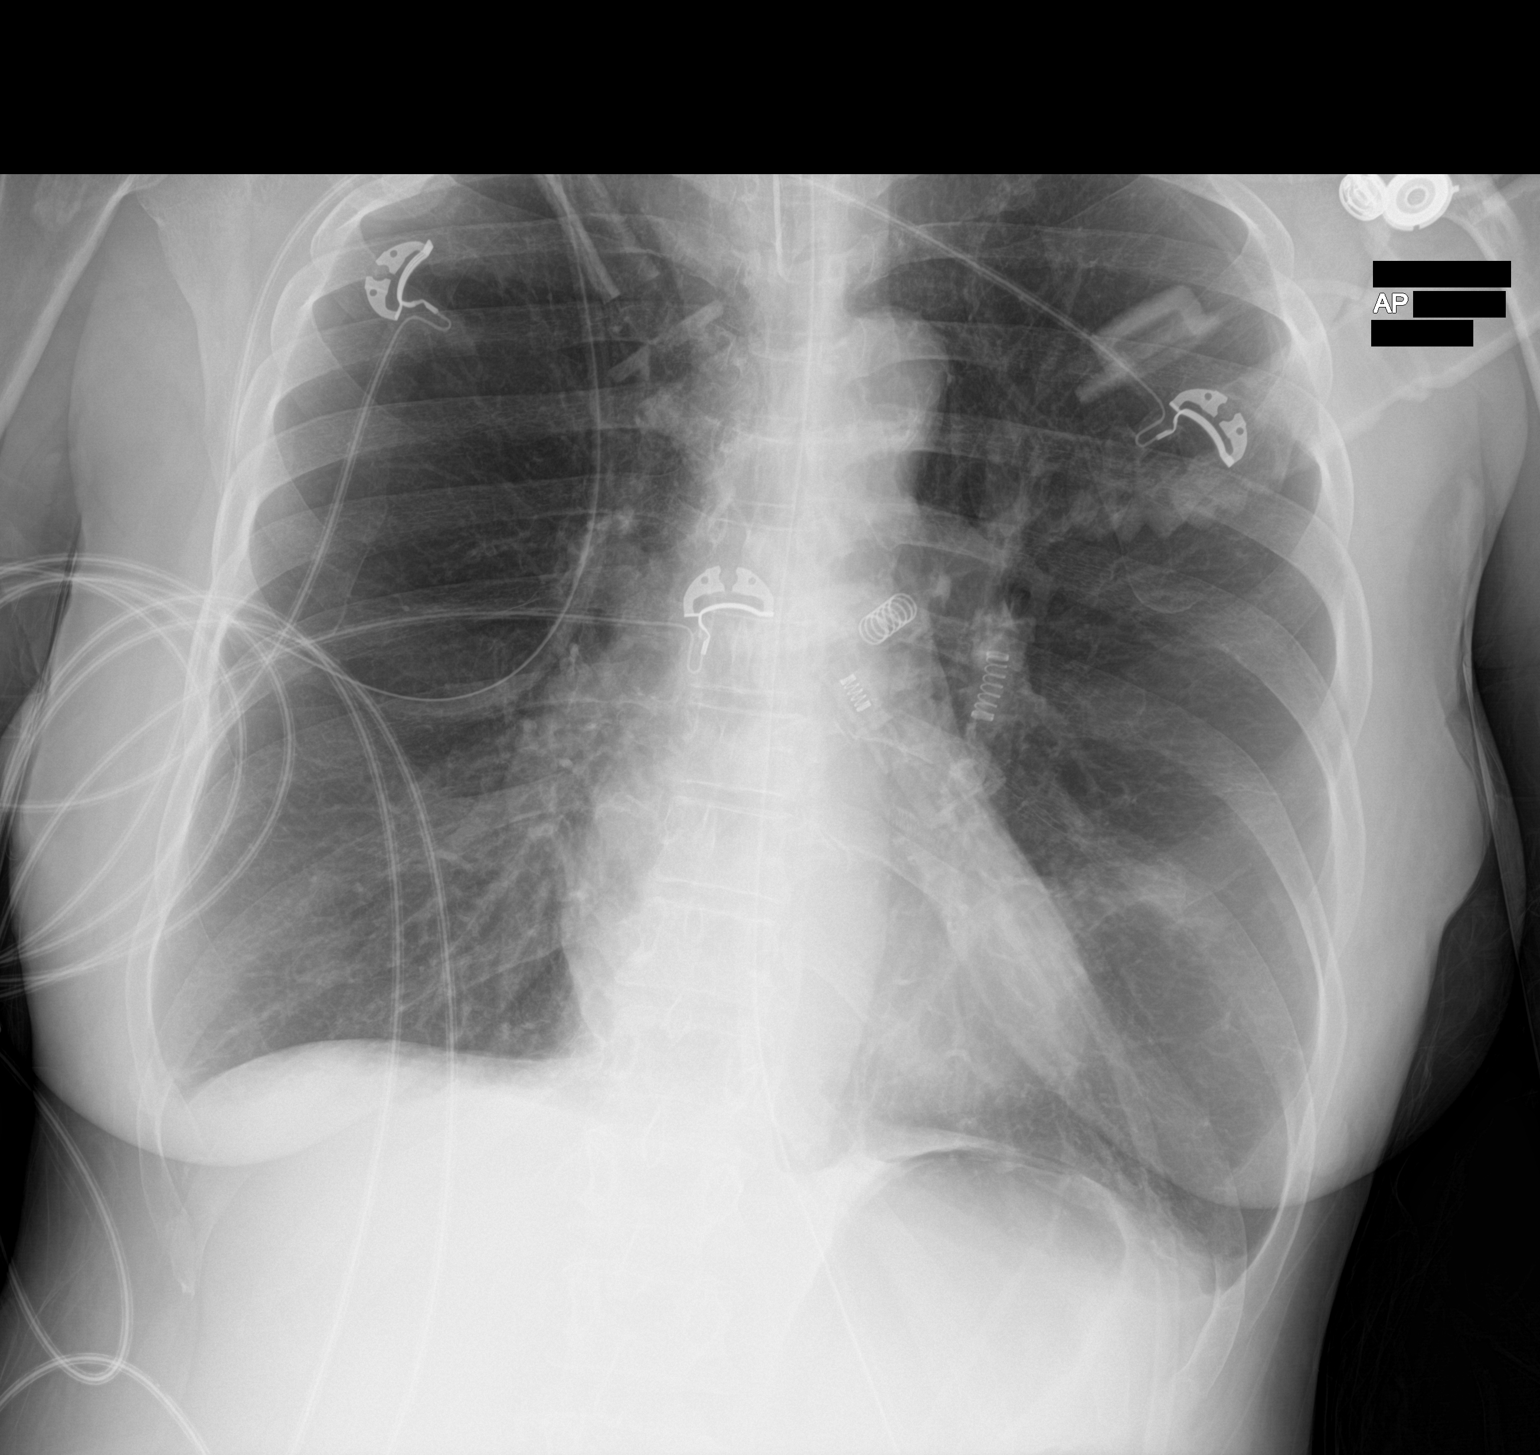

[1 of 1 positions shown; findings below may reference images not displayed]

FINDINGS: Endotracheal tube and NG tube are unchanged. Hyperinflation of the
lungs. Heart is normal size. Minimal left base atelectasis. No
effusions. Right lung clear. No acute bony abnormality.
IMPRESSION: Hyperinflation/COPD.  Left base atelectasis.

## 2019-01-17 IMAGING — DX DG ABDOMEN 1V
1 series · 1 of 1 positions shown · non-contrast
Comparison: Portable chest x-ray of 10/03/2017

CLINICAL DATA: History of pneumonia, history of acute renal injury,
smoking history

EXAM:
ABDOMEN - 1 VIEW

[abdomen kub]
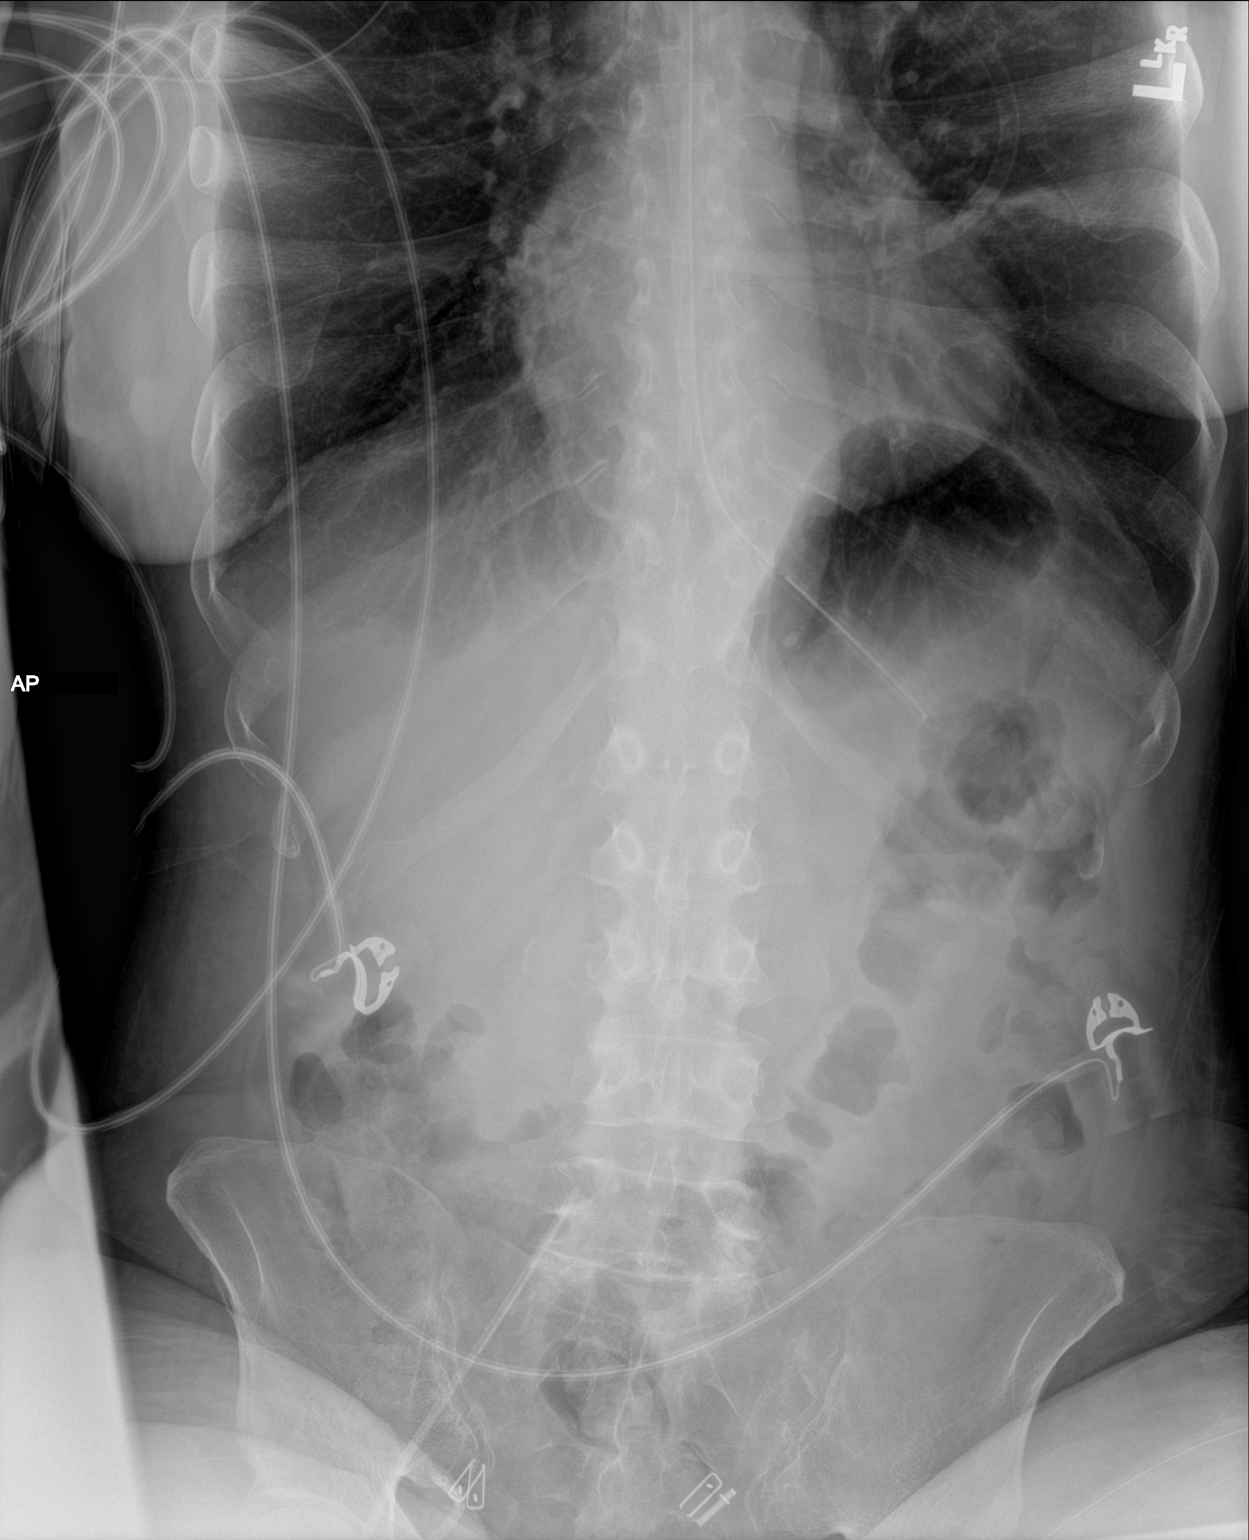

[1 of 1 positions shown; findings below may reference images not displayed]

FINDINGS: A supine view of the abdomen shows an NG tube extending into the
proximal body of the stomach. The bowel gas pattern is nonspecific.
No bowel obstruction is seen. A vascular sheath overlies the right
pelvis. No bony abnormality is noted.
IMPRESSION: 1. NG tube extends to a point overlying the proximal body of the
stomach.
2. No bowel obstruction.

## 2019-01-19 IMAGING — DX DG CHEST 1V PORT
2 series · 2 of 2 positions shown · non-contrast
Comparison: 10/04/2017 chest radiograph.

CLINICAL DATA: Pneumonia

EXAM:
PORTABLE CHEST 1 VIEW

[chest ap (1 of 2)]
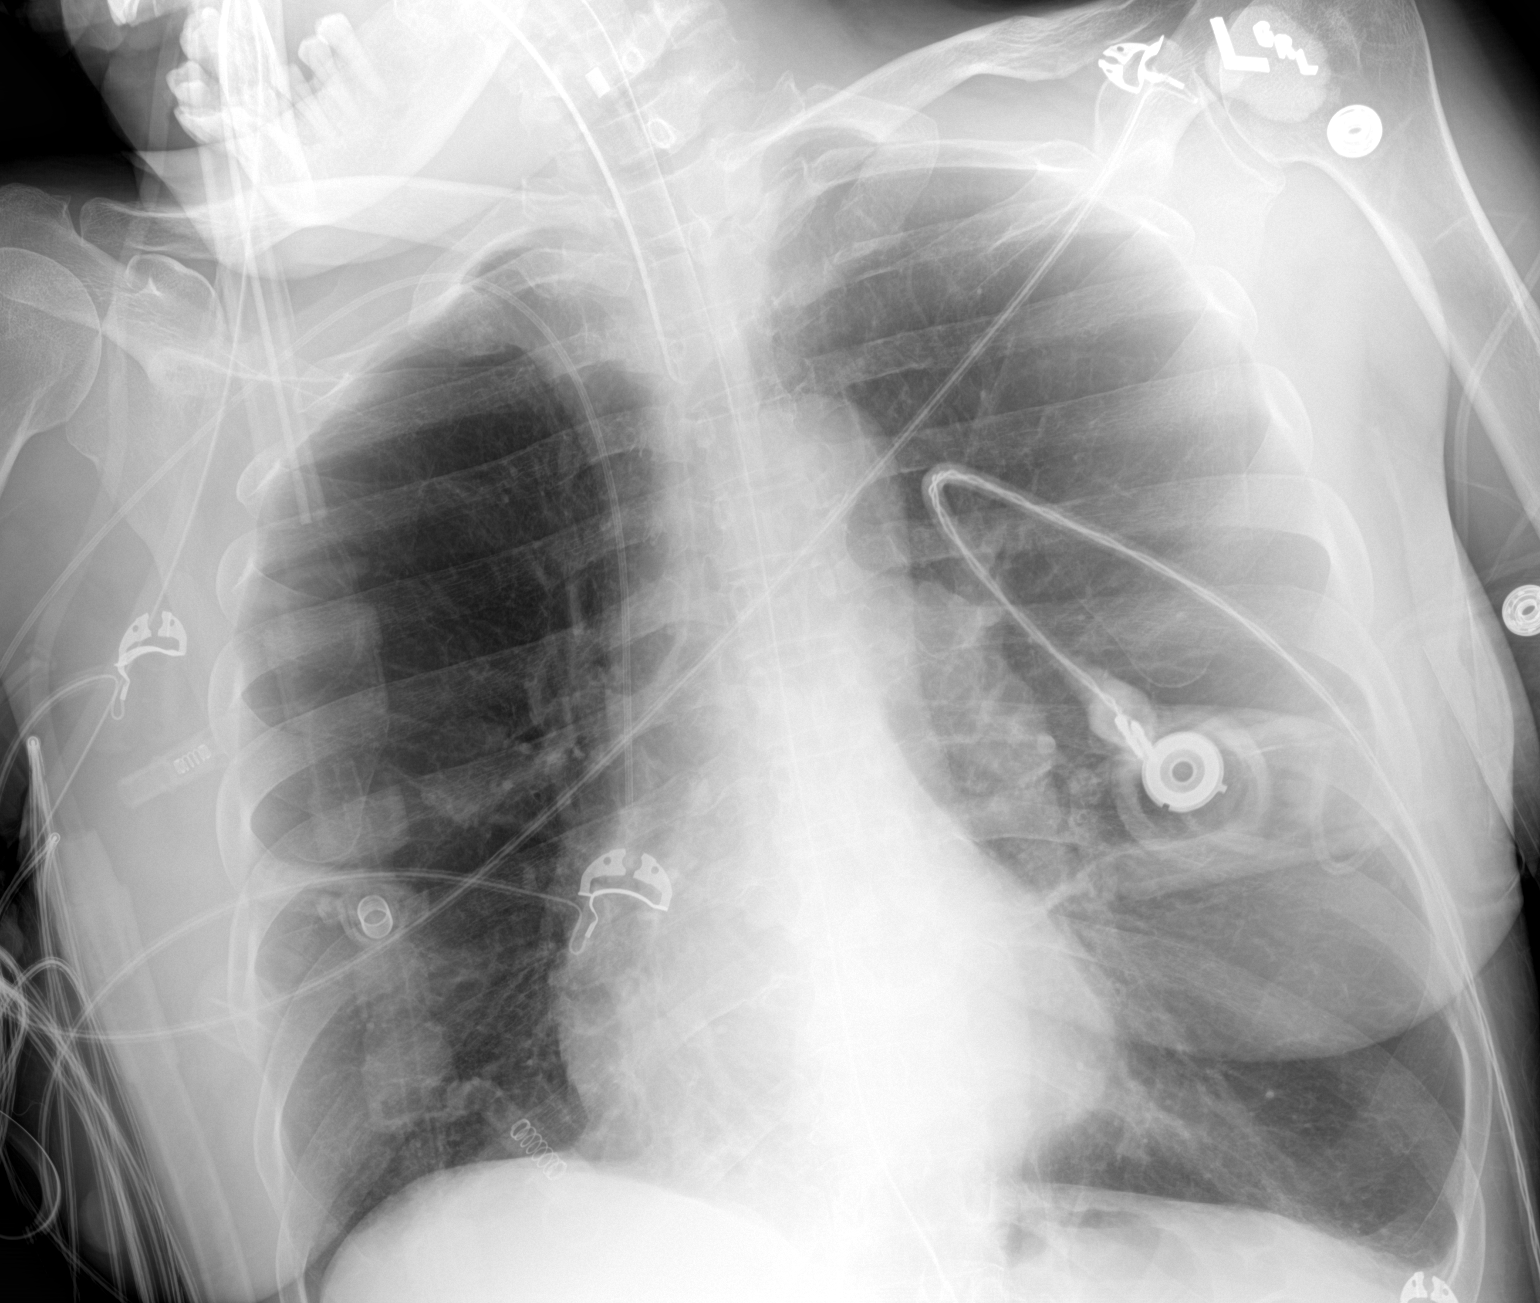

[chest ap (2 of 2)]
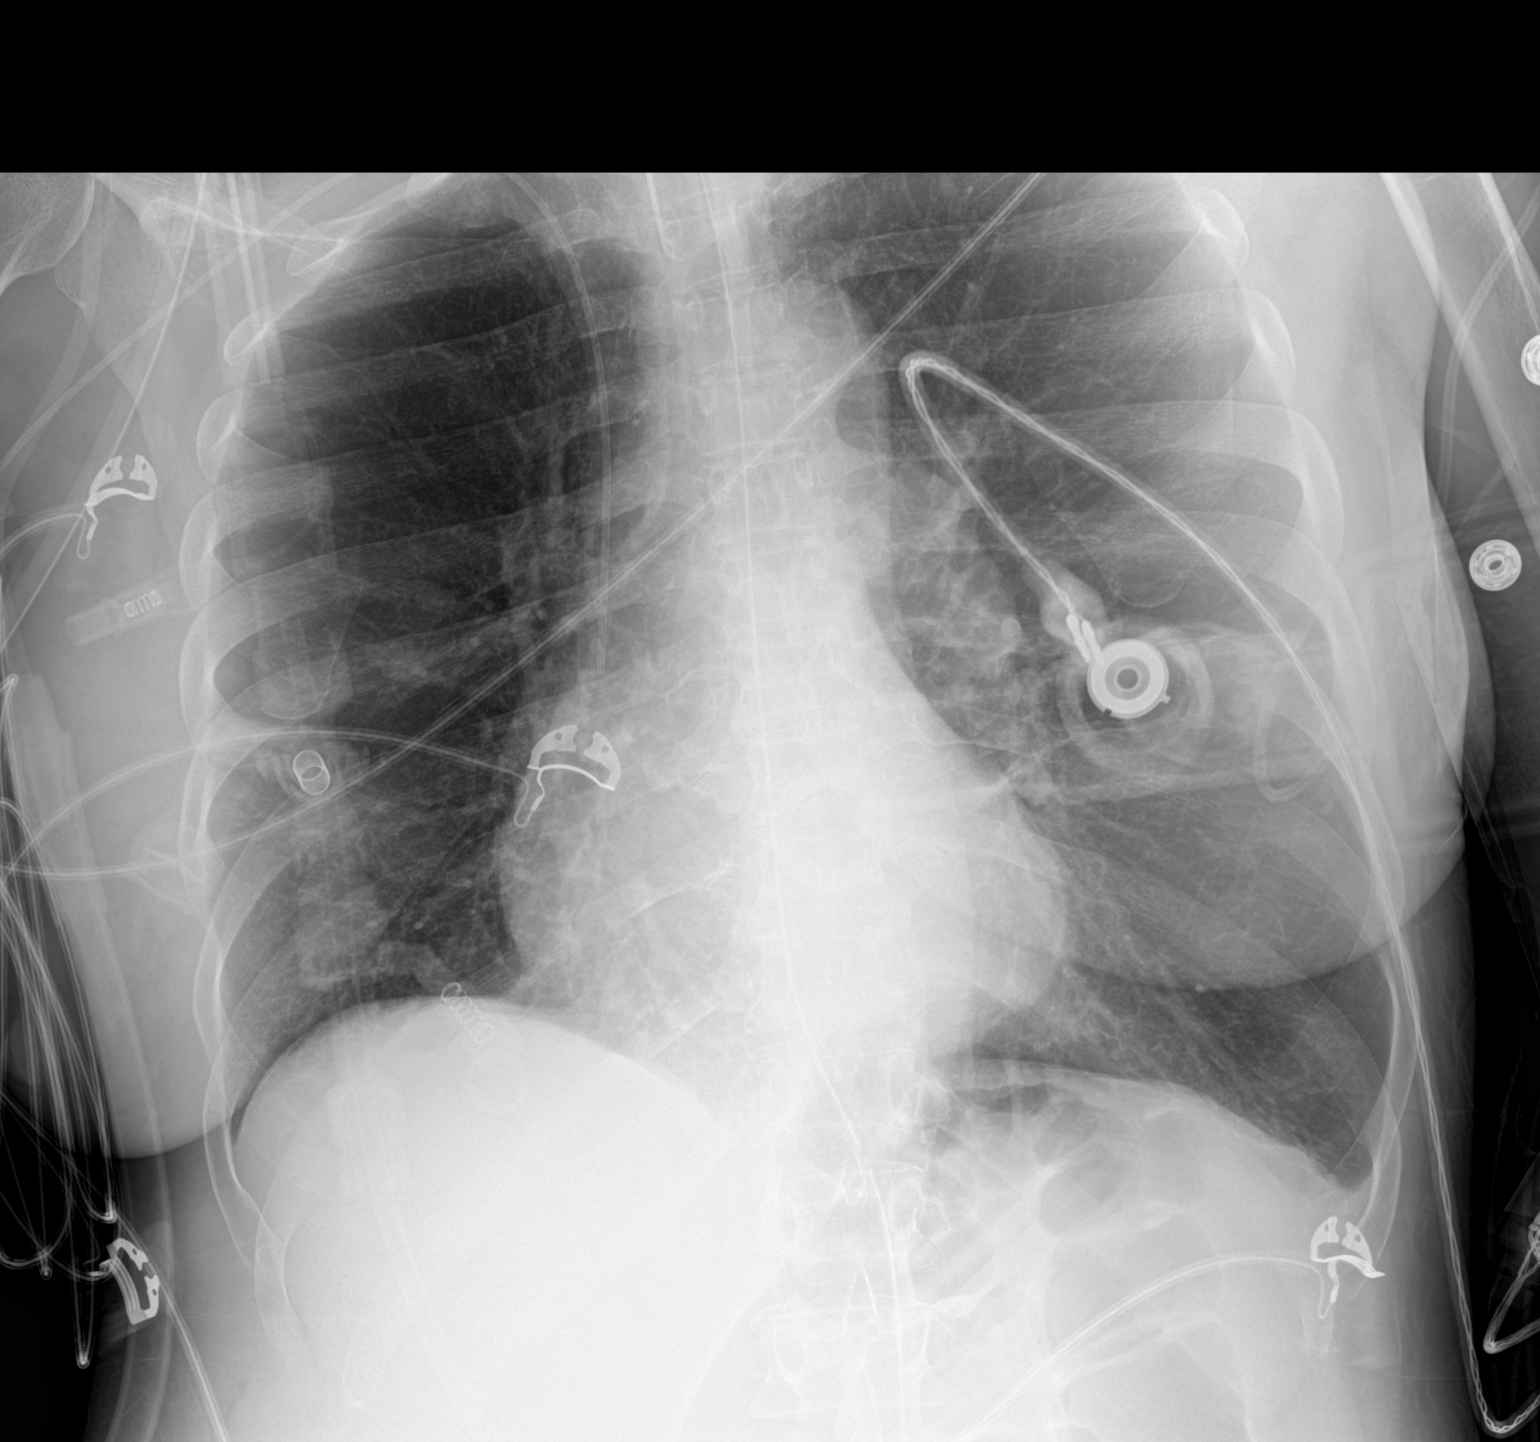

[2 of 2 positions shown; findings below may reference images not displayed]

FINDINGS: Endotracheal tube tip is 6.3 cm above the carina. Enteric tube
enters stomach with the tip not seen on this image. Right PICC
terminates in the lower third of the SVC. Stable cardiomediastinal
silhouette with normal heart size. No pneumothorax. No pleural
effusion. Hyperinflated lungs. No pulmonary edema. Mild left basilar
scarring versus atelectasis. No acute consolidative airspace
disease.
IMPRESSION: 1. Well-positioned support structures as detailed.
2. Hyperinflated lungs, suggesting COPD.
3. Stable mild left basilar scarring versus atelectasis.

## 2019-01-19 IMAGING — DX DG ABDOMEN 1V
1 series · 1 of 1 positions shown · non-contrast
Comparison: Prior radiograph from 10/04/2017.

CLINICAL DATA: Initial evaluation for ileus.

EXAM:
ABDOMEN - 1 VIEW

[abdomen kub]
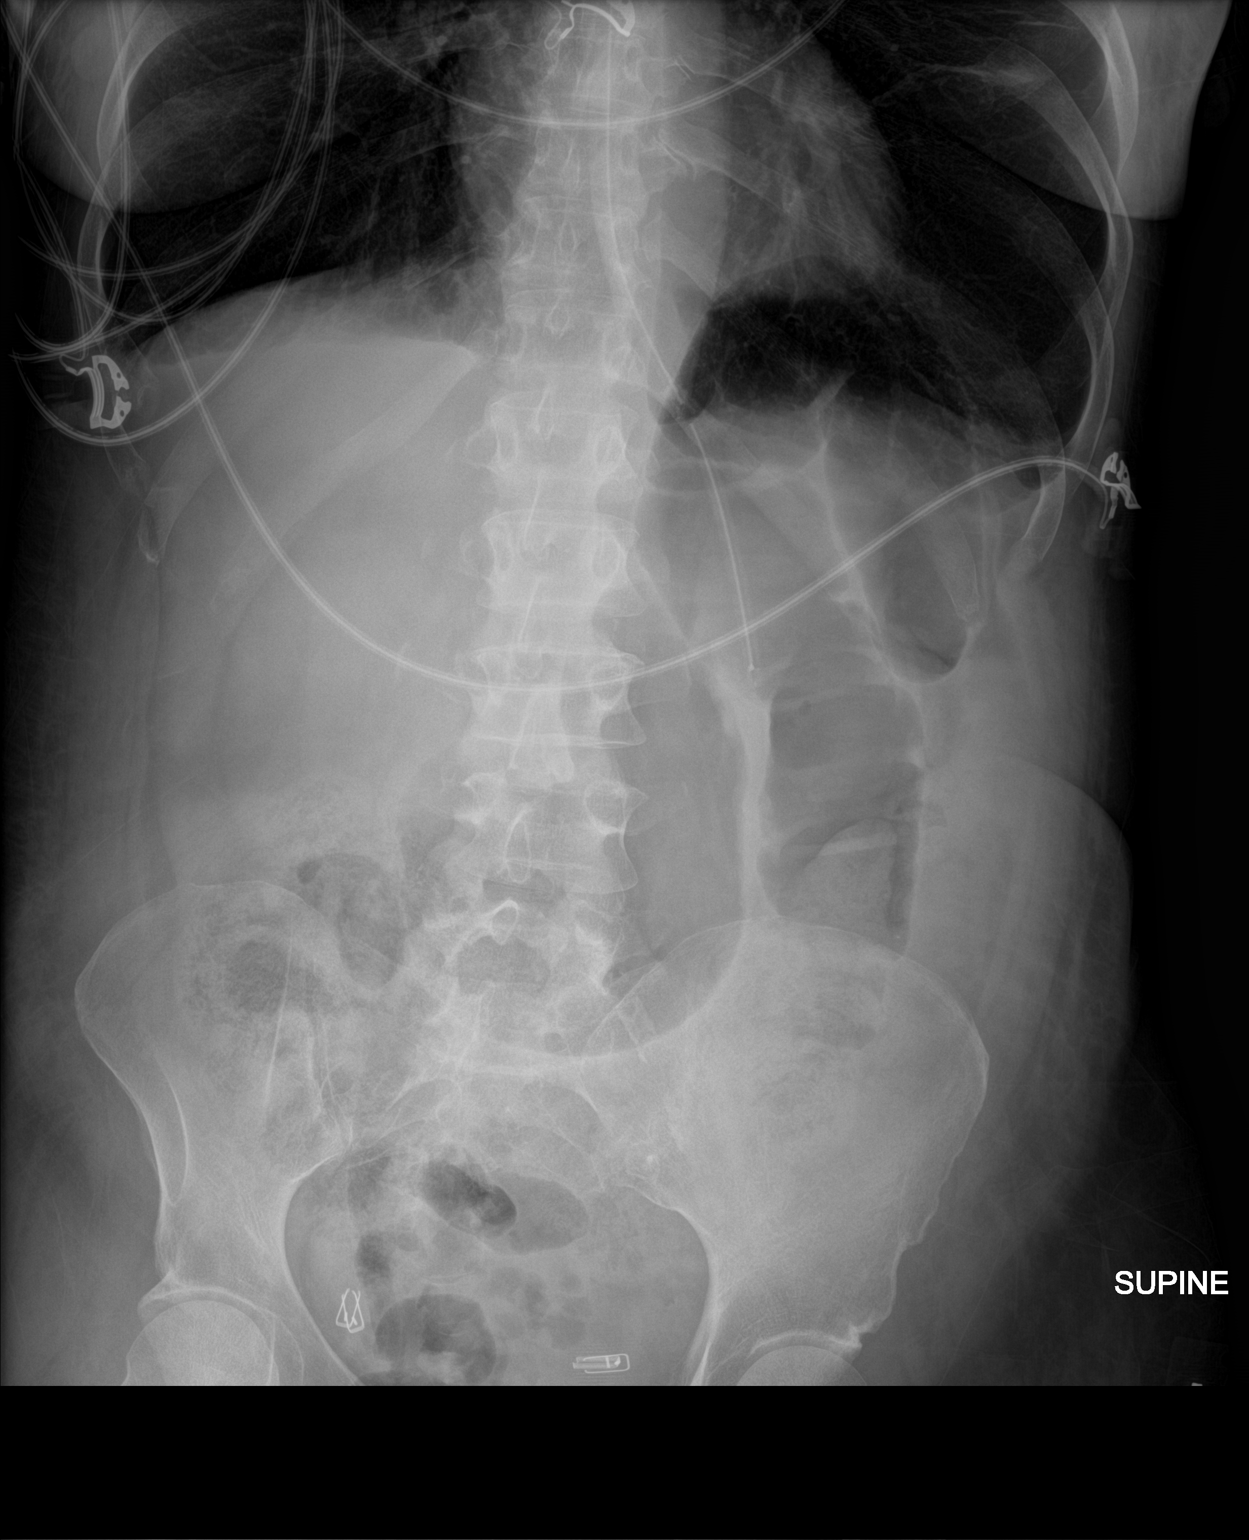

[1 of 1 positions shown; findings below may reference images not displayed]

FINDINGS: Enteric tube in place with tip overlying the gastric body, side hole
at or just beyond the GE junction. Few mildly prominent gas-filled
loops of bowel overlie the mid and left abdomen without overt
evidence for obstruction. No abnormal bowel wall thickening. No soft
tissue mass or abnormal calcification. Tubal ligation clips overlie
the pelvis.

Visualized lung bases are clear.
IMPRESSION: 1. Few mildly prominent gas-filled loops of bowel overlying the mid
and left abdomen without evidence for obstruction.
2. Enteric tube in place with tip overlying the gastric body, side
hole at or just beyond the level of the GE junction.

## 2019-01-20 IMAGING — DX DG CHEST 1V PORT
1 series · 2 of 2 positions shown · non-contrast
Comparison: October 06, 2017

CLINICAL DATA: Atelectasis.

EXAM:
PORTABLE CHEST 1 VIEW

[Series 1: chest ap · 0.14mm/px · 2 of 2 slices shown]
[im 1/2]
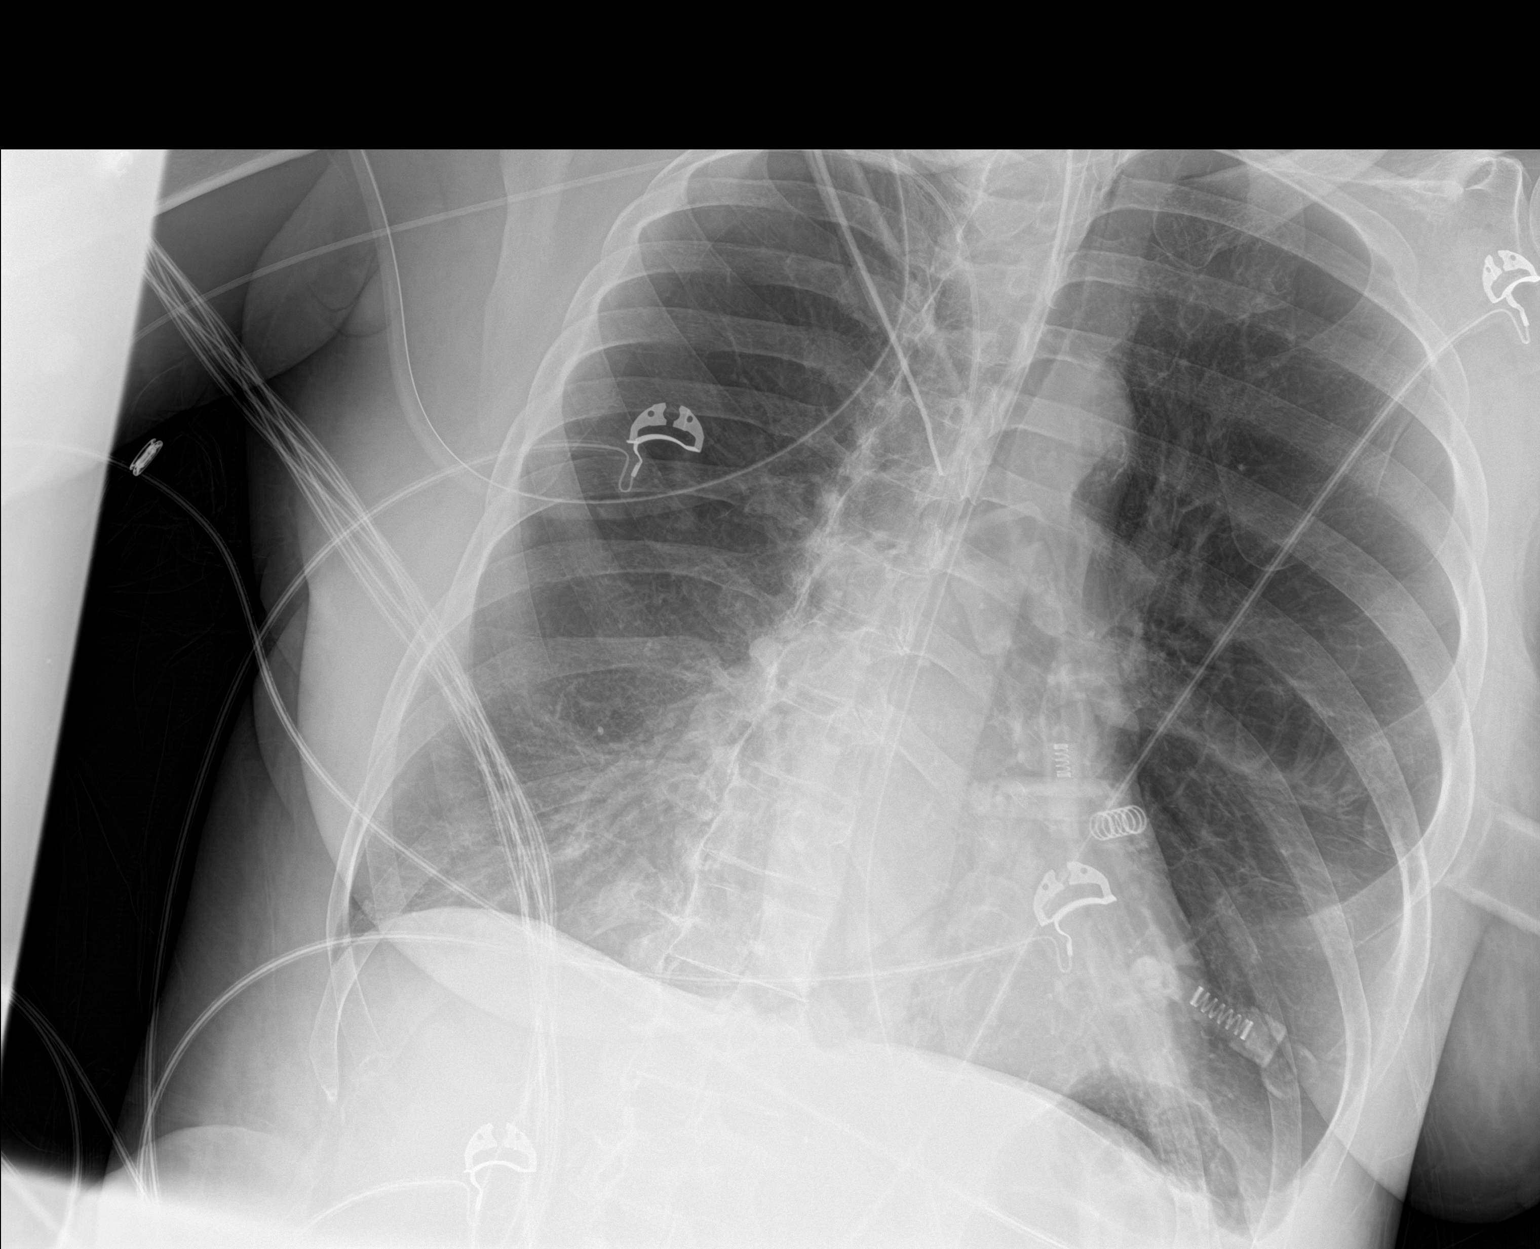
[im 2/2]
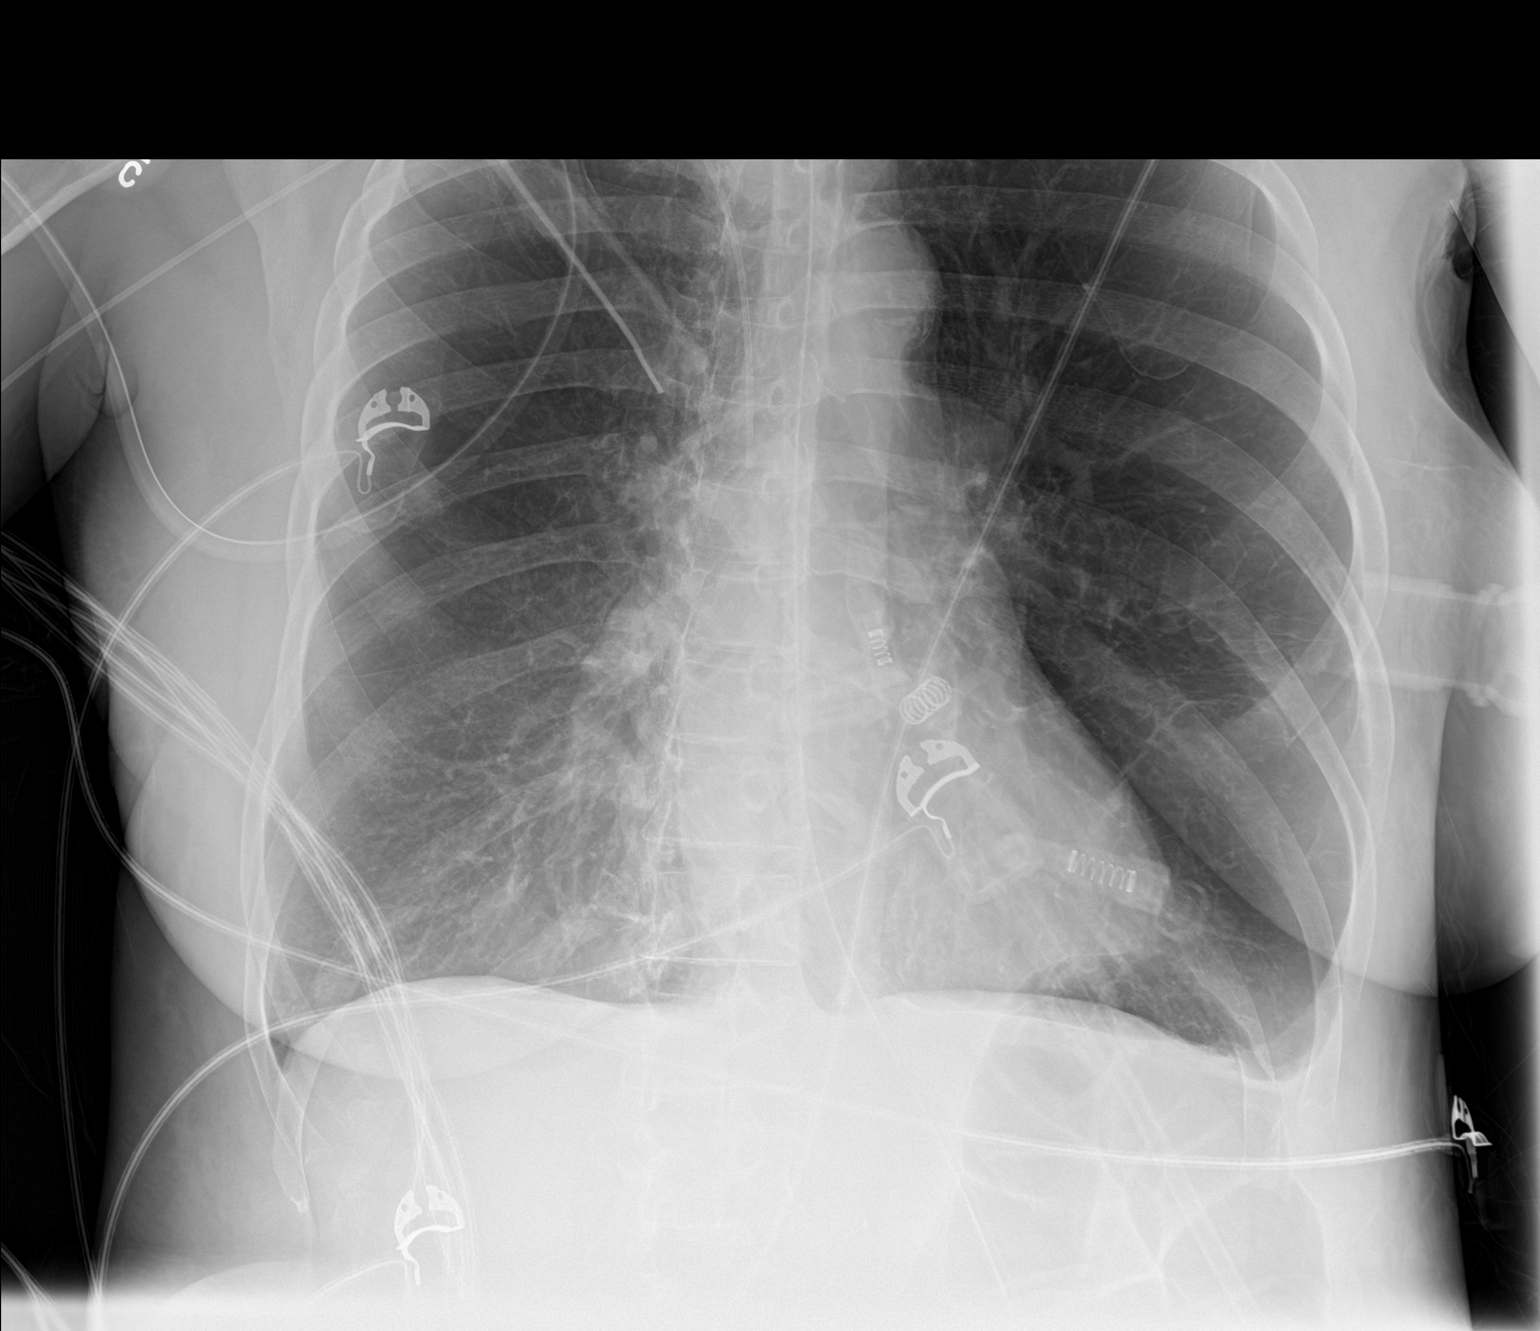

[2 of 2 positions shown; findings below may reference images not displayed]

FINDINGS: The ETT is in good position. The NG tube terminates below today's
film. A right PICC line terminates in the central SVC, stable. No
pneumothorax. The cardiomediastinal silhouette is normal. Haziness
in the medial right lung base may represent vascular
crowding/overlying soft tissue given positioning. Infiltrate
considered less likely. No other interval changes.
IMPRESSION: 1. Support apparatus as above.
2. There is haziness over the medial right lung base. I suspect this
is either vascular crowding or overlying soft tissue attenuation
given positioning. Subtle infiltrate considered less likely.
Recommend attention on follow-up.

## 2019-01-22 IMAGING — DX DG CHEST 1V PORT
1 series · 1 of 1 positions shown · non-contrast
Comparison: 10/07/2017

CLINICAL DATA: Intubated.

EXAM:
PORTABLE CHEST 1 VIEW

[chest ap]
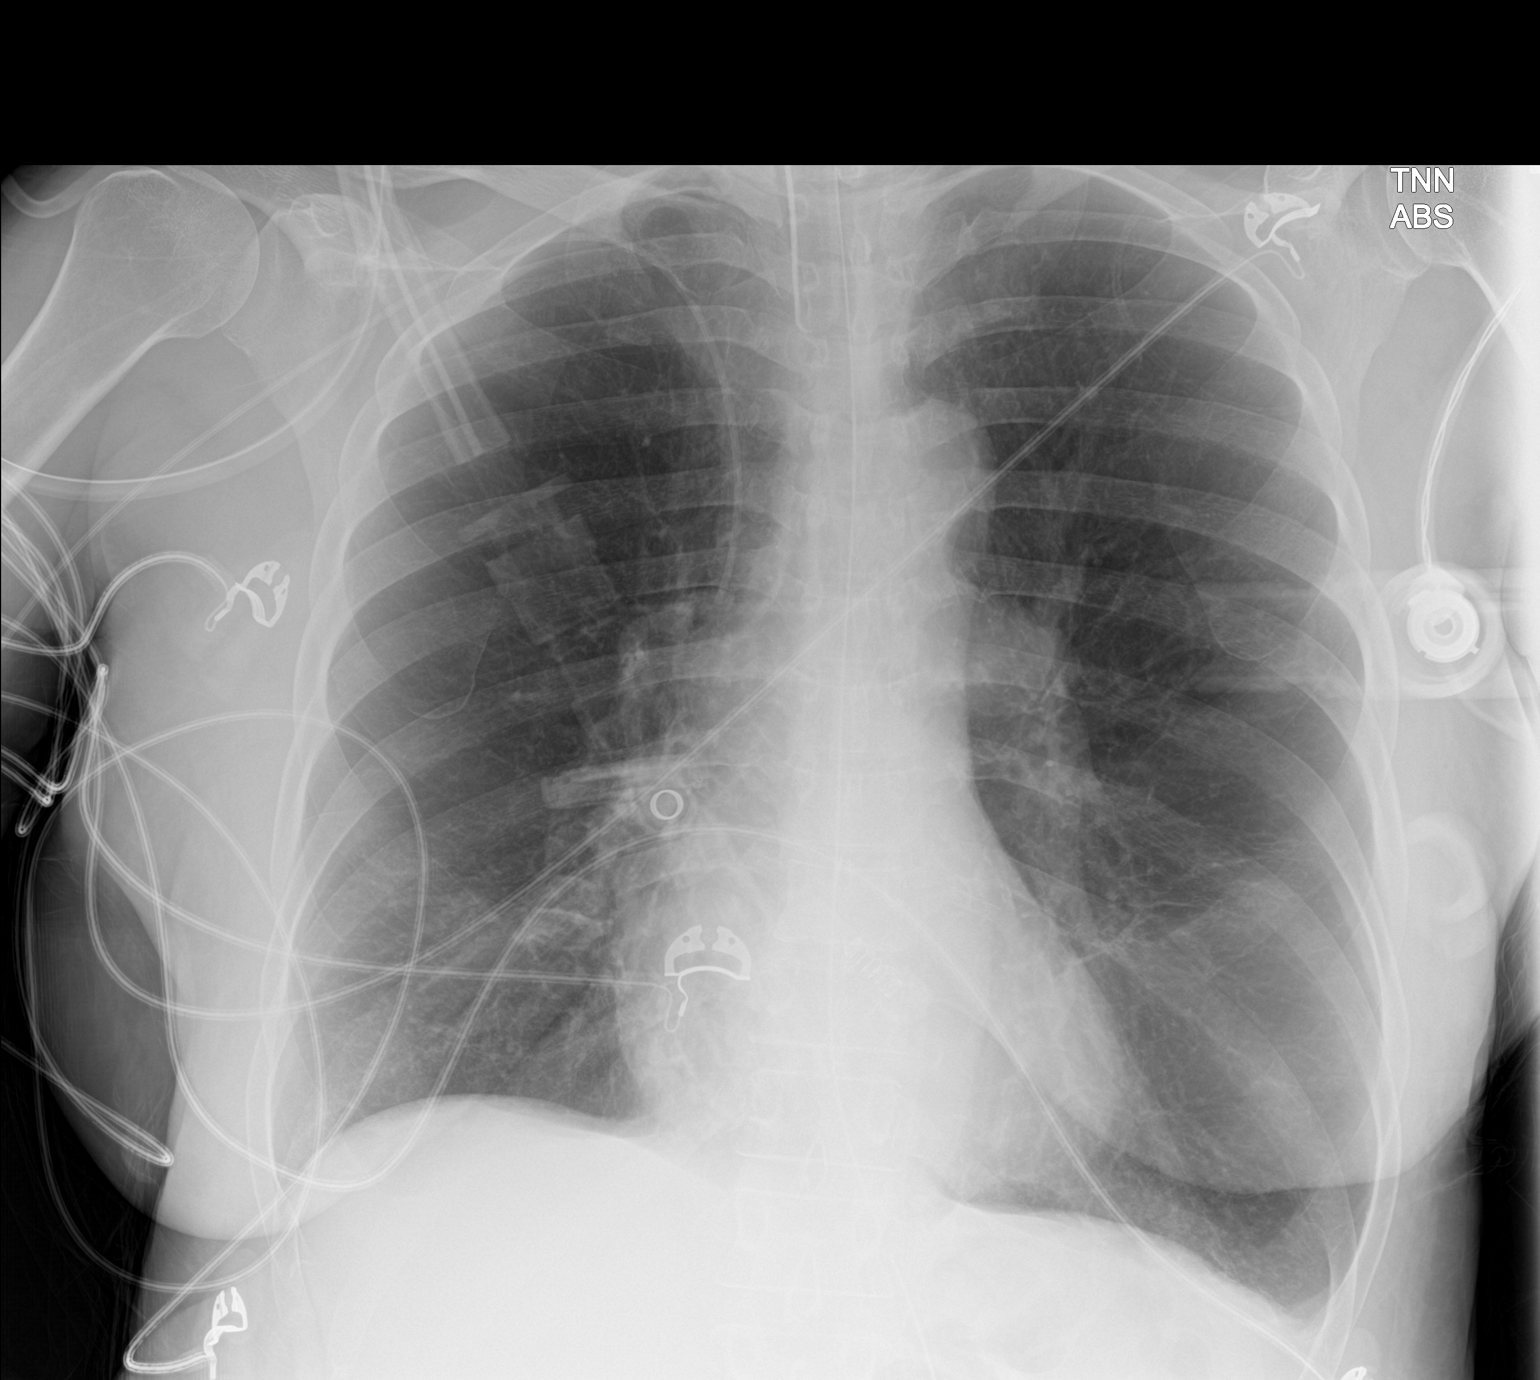

[1 of 1 positions shown; findings below may reference images not displayed]

FINDINGS: Endotracheal tube tip is 6 cm above the carina. Nasogastric tube
enters the abdomen. Right arm PICC tip in the SVC at the azygos
level. Emphysematous changes of the lungs as noted previously. No
consolidation, collapse or effusion.
IMPRESSION: Lines and tubes well positioned. Emphysema. No focal finding
otherwise.

## 2019-01-26 IMAGING — DX DG CHEST 1V PORT
1 series · 2 of 2 positions shown · non-contrast
Comparison: 10/12/2017

CLINICAL DATA: Encounter for patient on mechanically assisted
ventilation.

EXAM:
PORTABLE CHEST 1 VIEW

[Series 1: chest ap · 0.14mm/px · 2 of 2 slices shown]
[im 1/2]
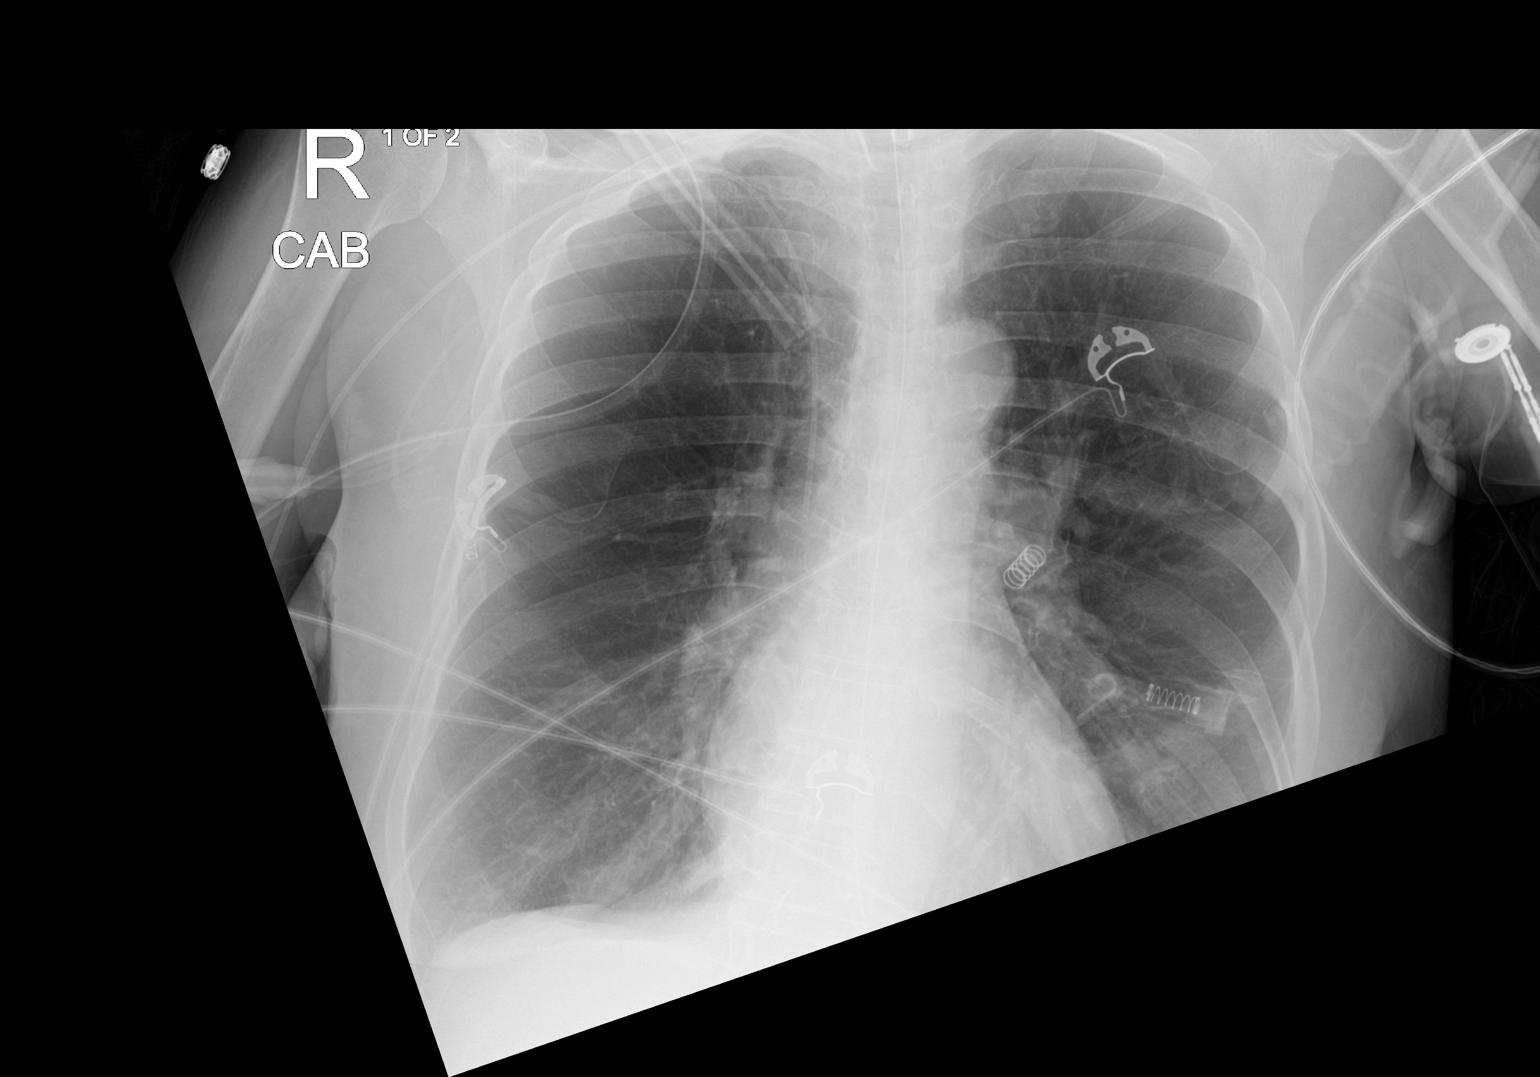
[im 2/2]
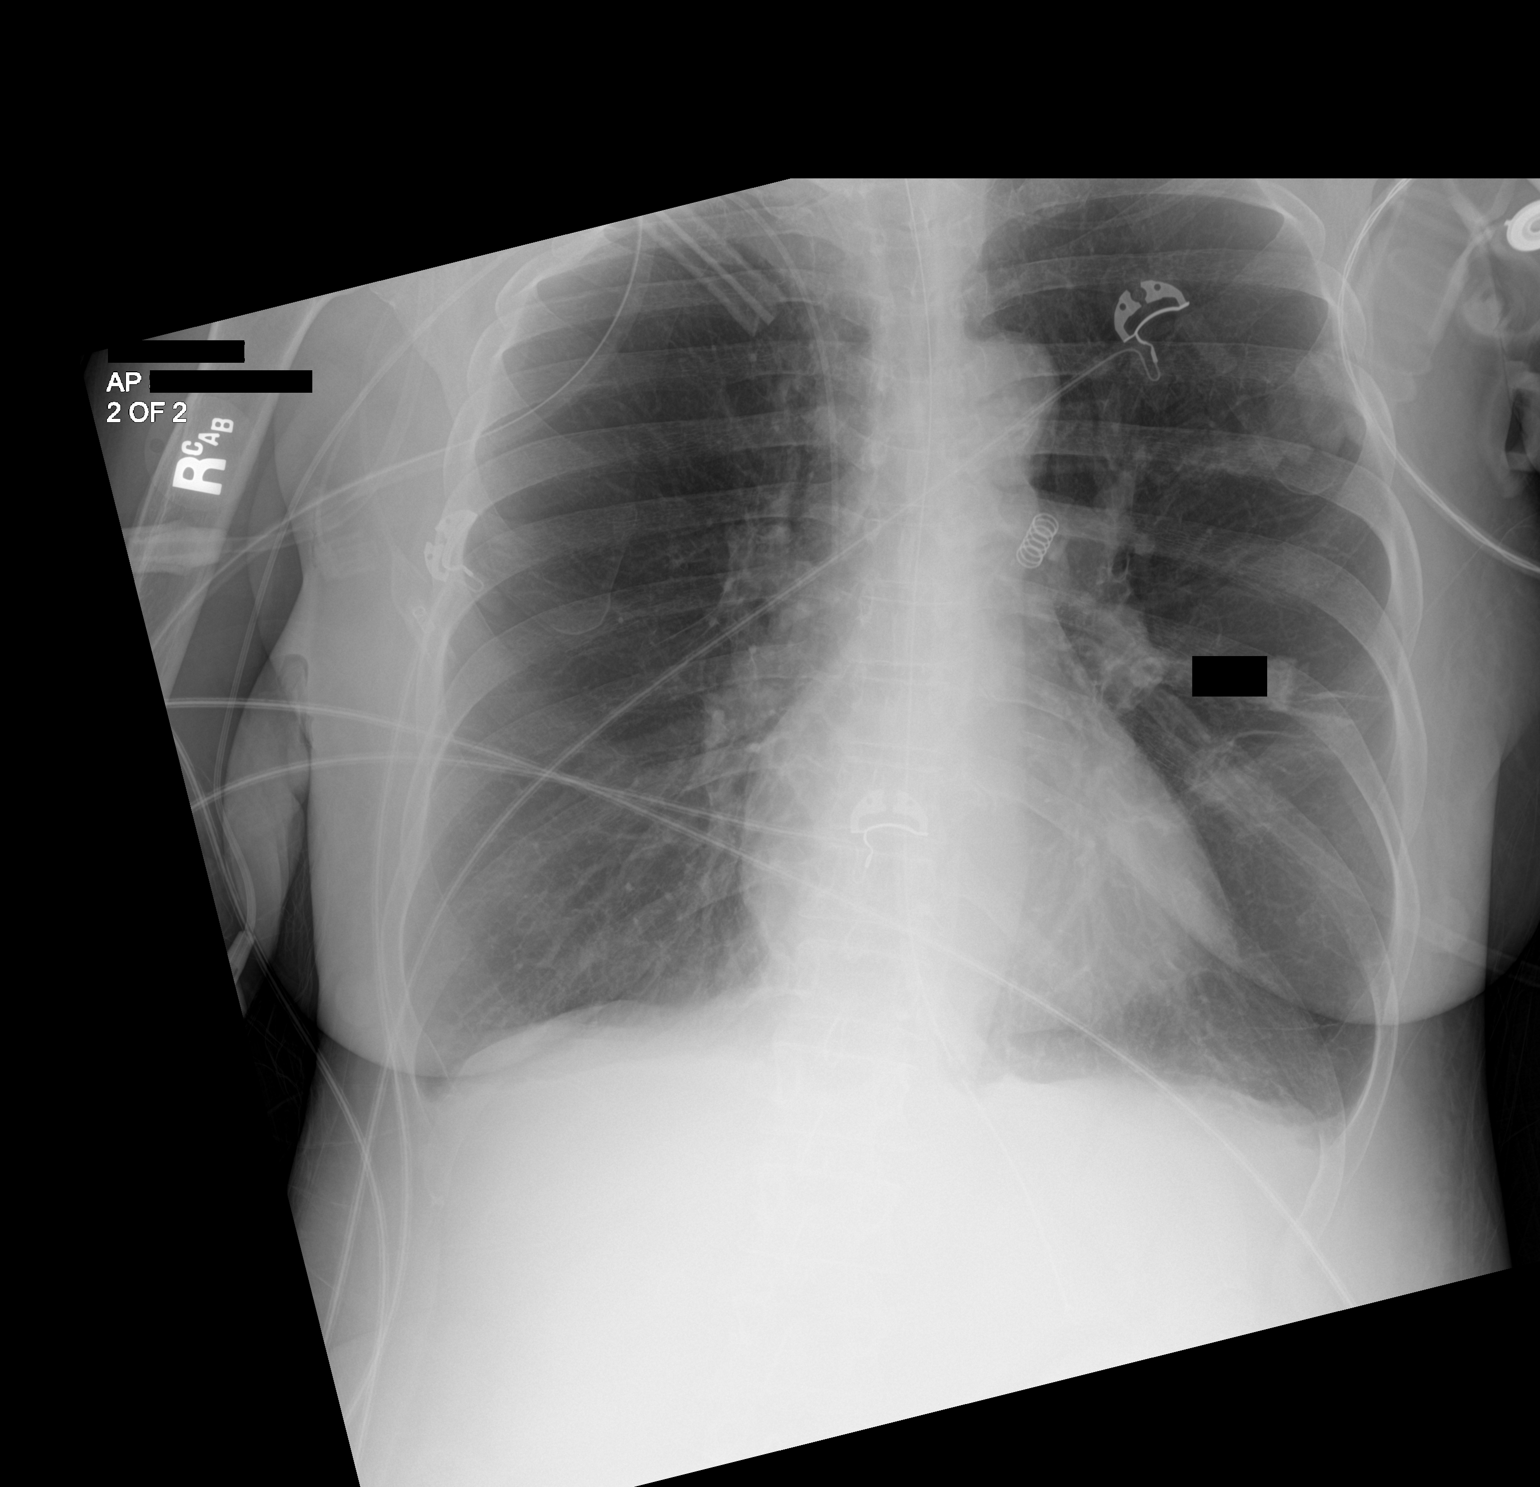

[2 of 2 positions shown; findings below may reference images not displayed]

FINDINGS: endotracheal tube is in place with tip approximately 8 centimeters
above the carina. Nasogastric tube is in place, tip beyond the cast
septal junction and overlying the region of the gastric fundus. A
RIGHT UPPER extremity PICC line tip overlies the superior vena cava.

Lungs are mildly hyperinflated. There is pleural thickening or small
pleural effusions. Bilateral mid lung zone atelectasis. No pulmonary
edema.
IMPRESSION: 1. Hyperinflation and minimal bilateral atelectasis.
2. Lines and tubes as described.

## 2019-01-27 IMAGING — DX DG CHEST 1V PORT
1 series · 1 of 1 positions shown · non-contrast
Comparison: 10/13/2017

CLINICAL DATA: Intubation

EXAM:
PORTABLE CHEST 1 VIEW

[chest ap]
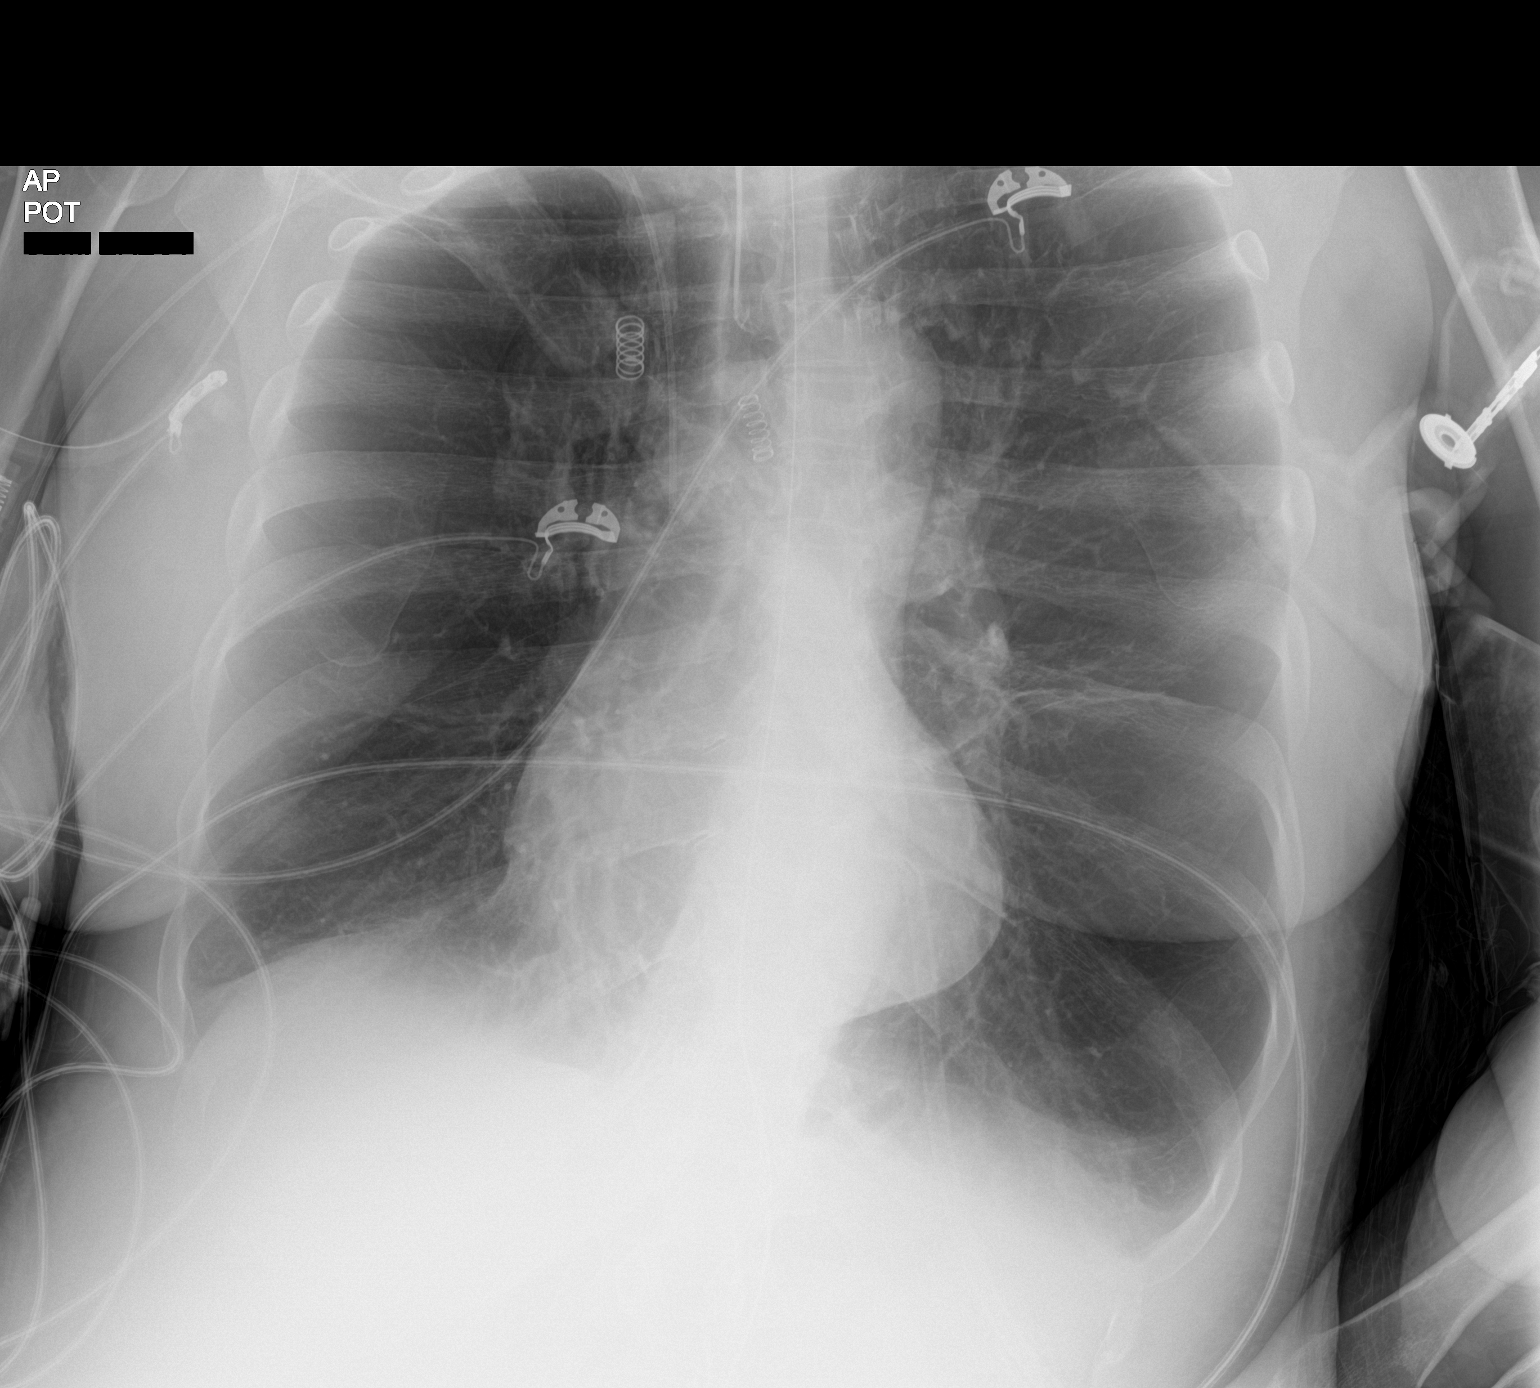

[1 of 1 positions shown; findings below may reference images not displayed]

FINDINGS: Endotracheal tube tip between the clavicular heads and carina. Right
upper extremity PICC with tip at the SVC. An orogastric tube reaches
the stomach.

Hyperinflation and lucency. Minor atelectasis greater on the left.
There is no edema, consolidation, effusion, or pneumothorax. Normal
heart size and mediastinal contours.
IMPRESSION: 1. Unremarkable hardware positioning.
2. Hyperinflation with mild atelectasis on the left.

## 2019-01-28 ENCOUNTER — Telehealth: Payer: Self-pay | Admitting: Primary Care

## 2019-01-28 NOTE — Telephone Encounter (Signed)
T/c from [name], Call returned x 2, no answer. RN in office will attempt again tomorrow and schedule if needed.

## 2019-01-29 ENCOUNTER — Telehealth: Payer: Self-pay

## 2019-01-29 IMAGING — DX DG CHEST 1V PORT
1 series · 1 of 1 positions shown · non-contrast
Comparison: Radiograph October 14, 2017.

CLINICAL DATA: Respiratory failure.

EXAM:
PORTABLE CHEST 1 VIEW

[chest ap]
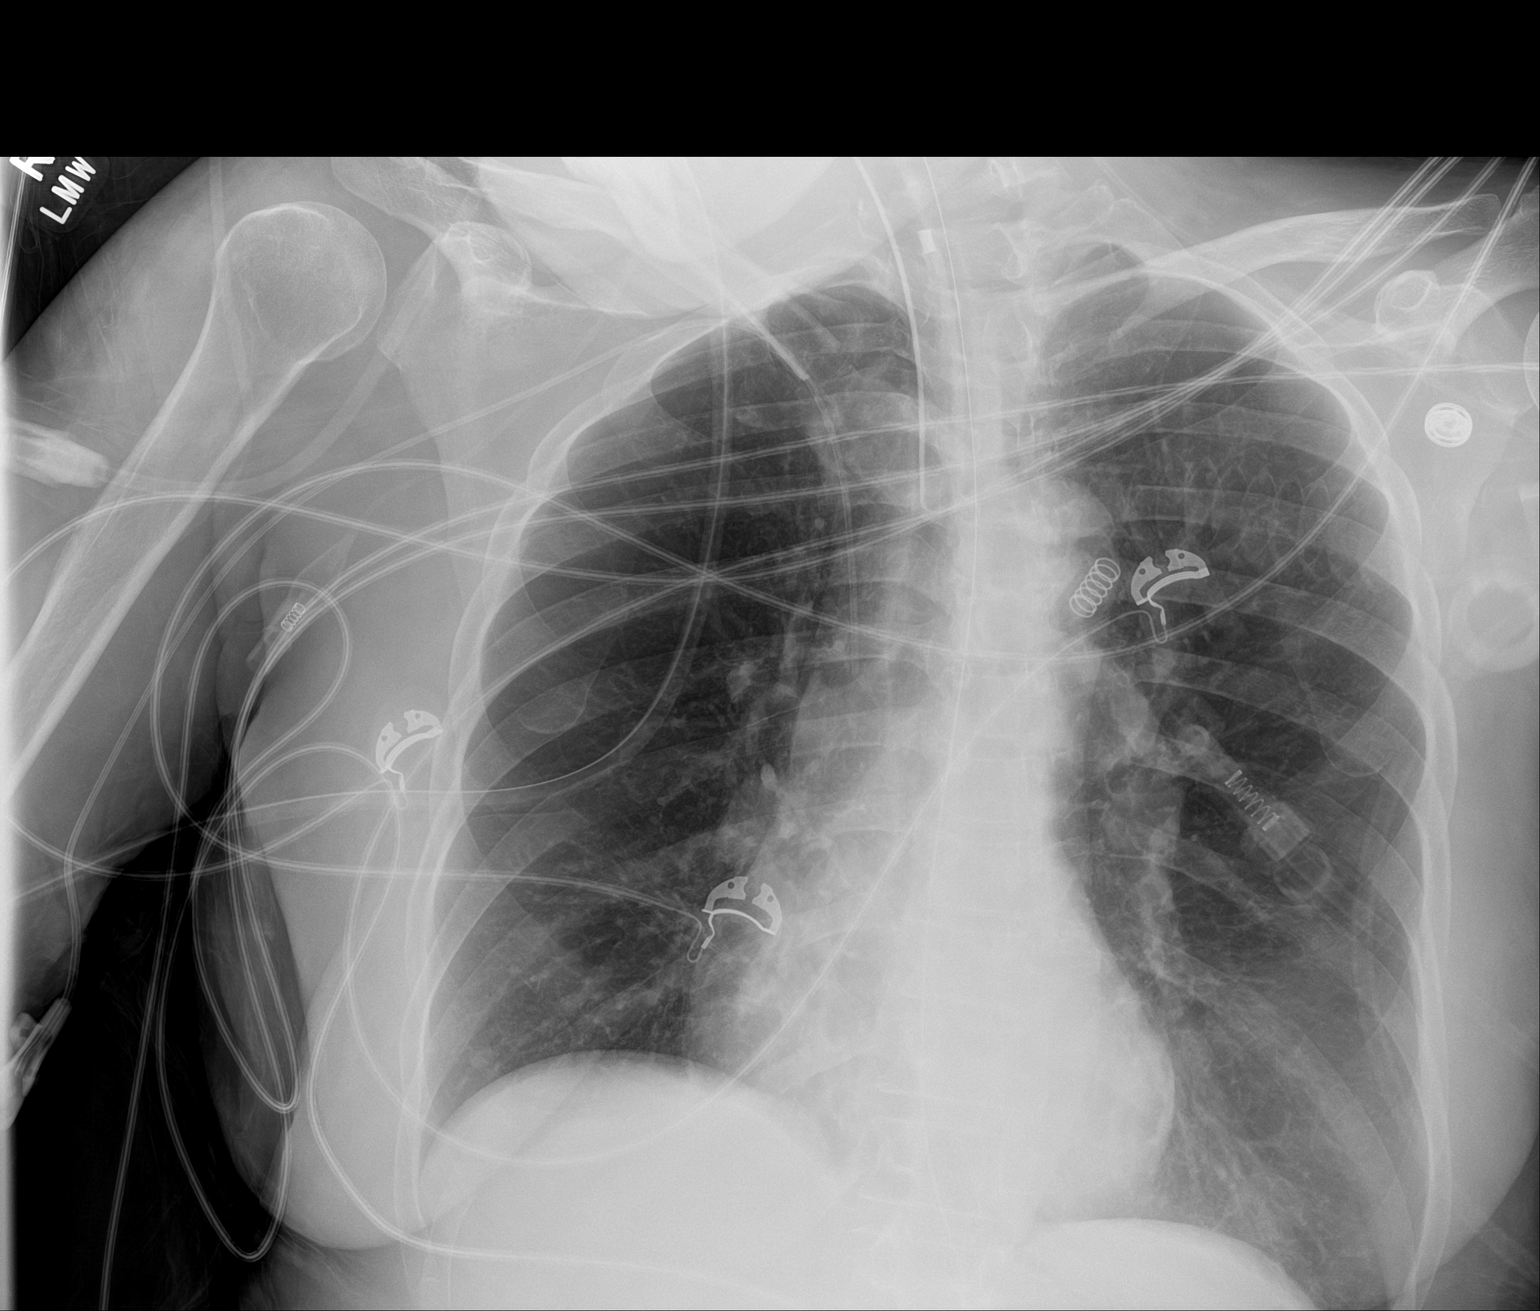

[1 of 1 positions shown; findings below may reference images not displayed]

FINDINGS: Endotracheal and nasogastric tubes are unchanged in position.
Right-sided PICC line is unchanged in position. No pneumothorax or
pleural effusion is noted. No acute pulmonary disease is noted. Bony
thorax is unremarkable.
IMPRESSION: Stable support apparatus. No acute cardiopulmonary abnormality seen.

## 2019-01-29 IMAGING — DX DG ABDOMEN 1V
1 series · 1 of 1 positions shown · non-contrast
Comparison: 10/06/2017

CLINICAL DATA: NG tube placement

EXAM:
ABDOMEN - 1 VIEW

[abdomen supine]
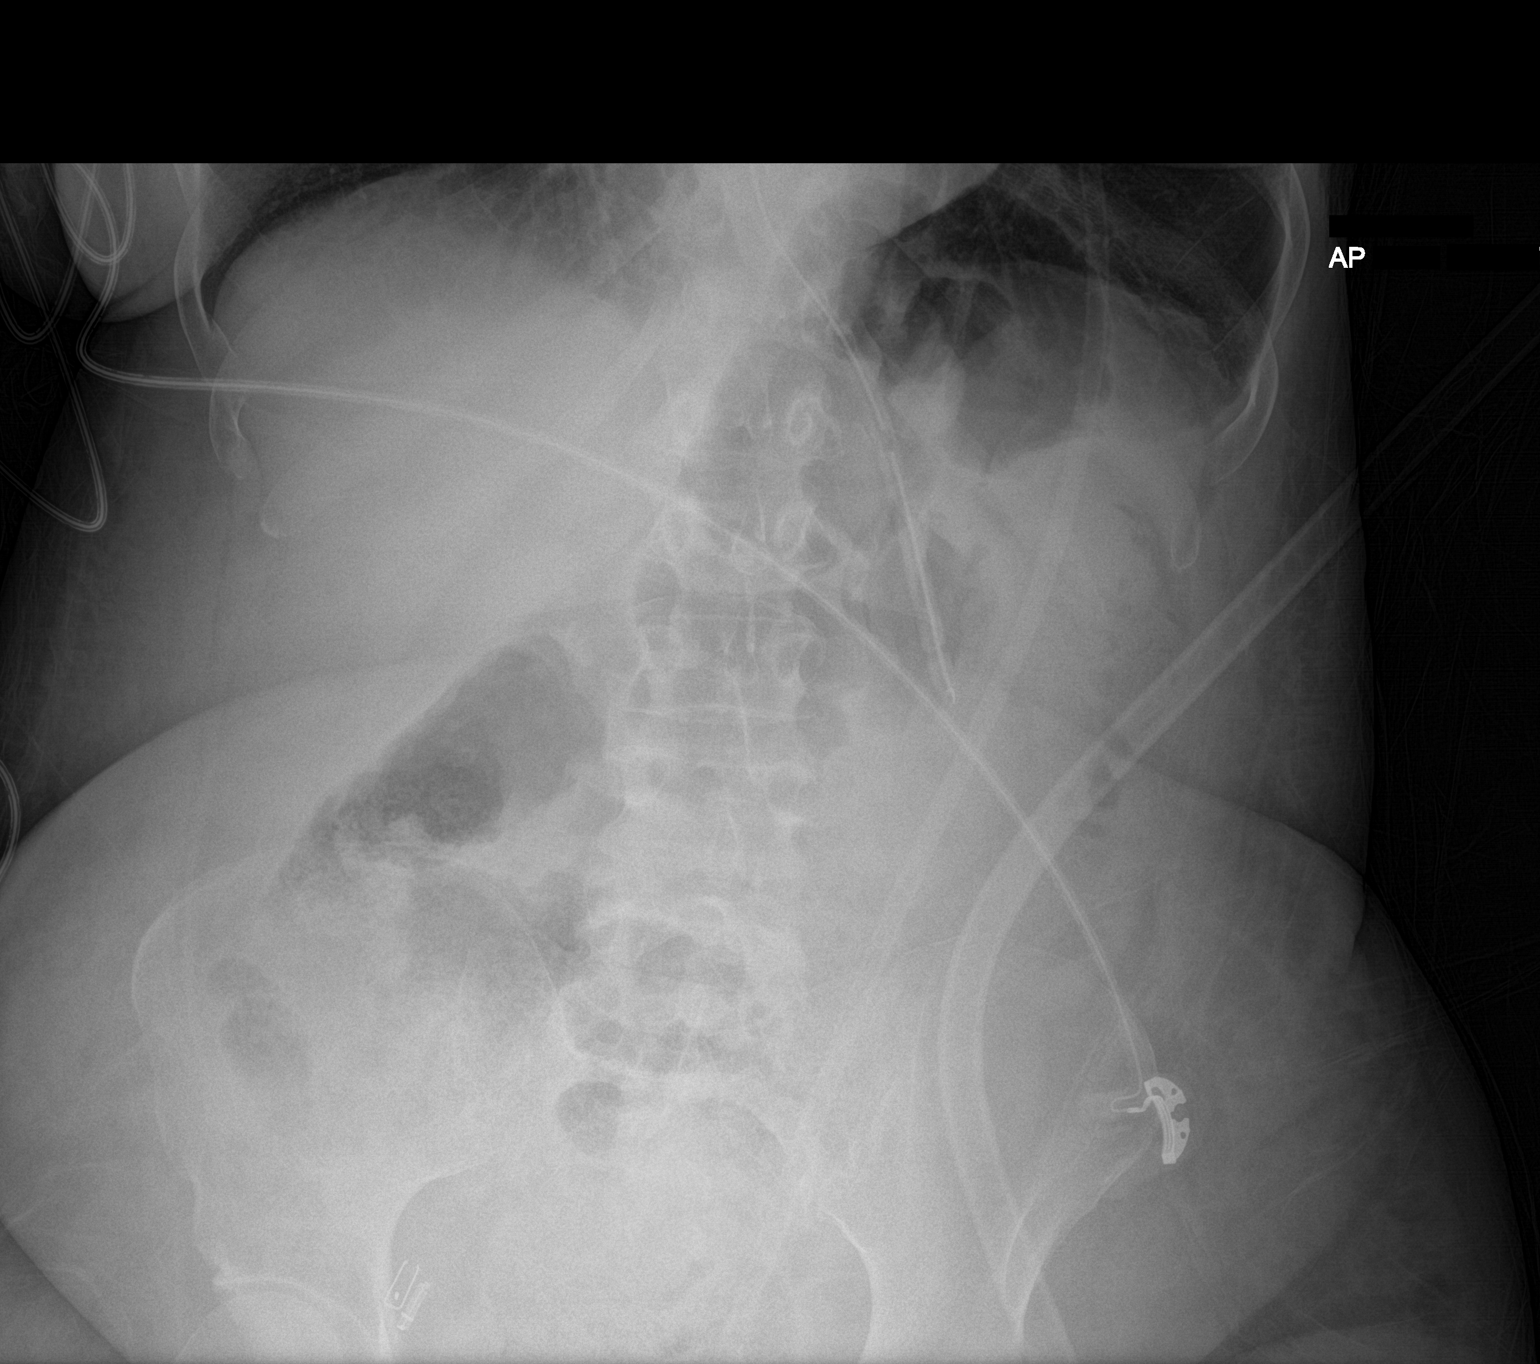

[1 of 1 positions shown; findings below may reference images not displayed]

FINDINGS: Esophageal tube tip and side port overlie the proximal stomach.
Decreased gaseous enlargement of stomach. Mild air-filled colon in
the upper abdomen.
IMPRESSION: Esophageal tube tip and side port overlie the proximal stomach

## 2019-01-29 NOTE — Telephone Encounter (Signed)
Attempted to reach patient's daughter. Number not in service

## 2019-01-29 NOTE — Telephone Encounter (Signed)
Attempted to return call to patient's daughter. No answer and no VM set up

## 2019-01-30 IMAGING — DX DG CHEST 1V PORT
1 series · 1 of 1 positions shown · non-contrast
Comparison: 10/16/2017

CLINICAL DATA: Increasing shortness of breath today

EXAM:
PORTABLE CHEST 1 VIEW

[chest ap]
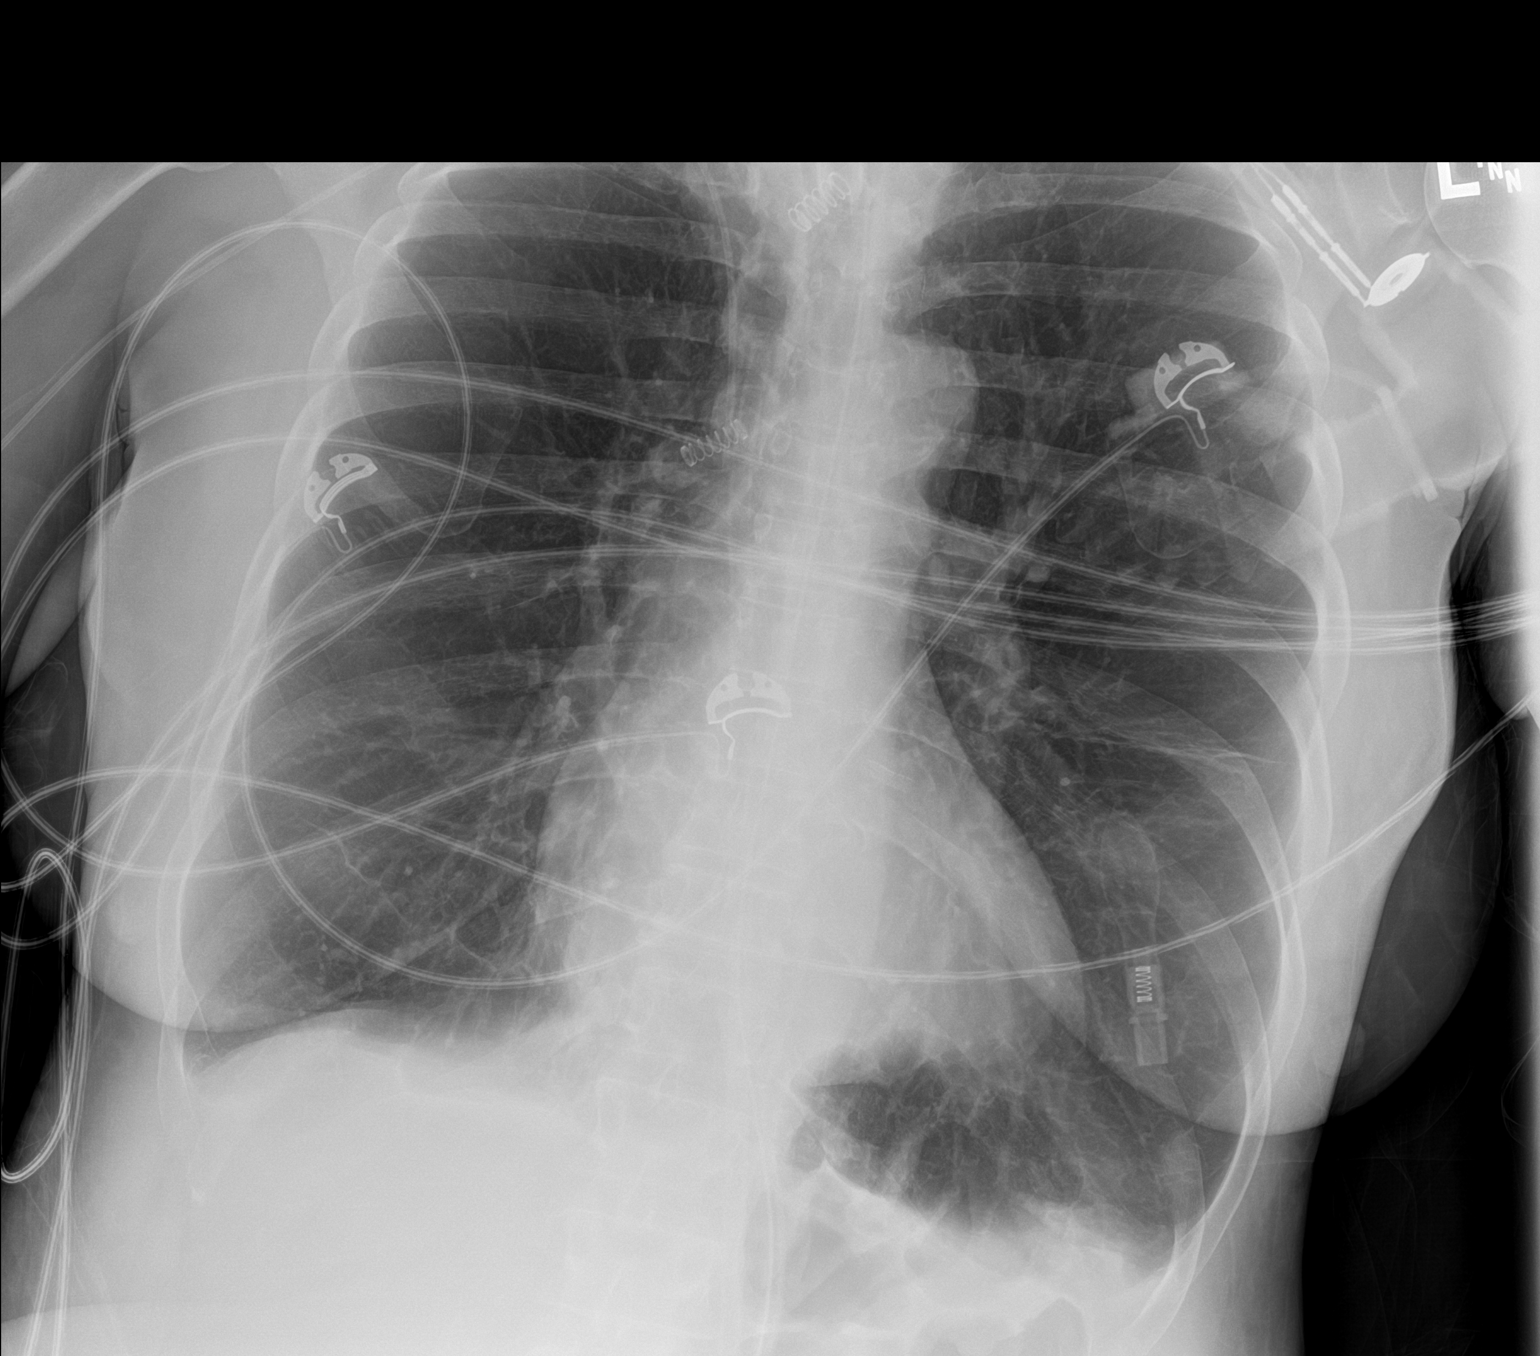

[1 of 1 positions shown; findings below may reference images not displayed]

FINDINGS: Cardiac shadow is within normal limits. Tracheostomy tube and
right-sided PICC line are noted in satisfactory position. The
nasogastric catheter is also noted extending into the stomach. The
lungs are hyperinflated consistent with COPD. No focal infiltrate or
sizable effusion is seen.
IMPRESSION: COPD without acute abnormality.

Tubes and lines as described.

## 2019-01-30 IMAGING — DX DG ABDOMEN 1V
1 series · 1 of 1 positions shown · non-contrast
Comparison: Abdominal radiograph October 16, 2017 none

CLINICAL DATA: Orogastric tube placement.

EXAM:
ABDOMEN - 1 VIEW

[abdomen supine]
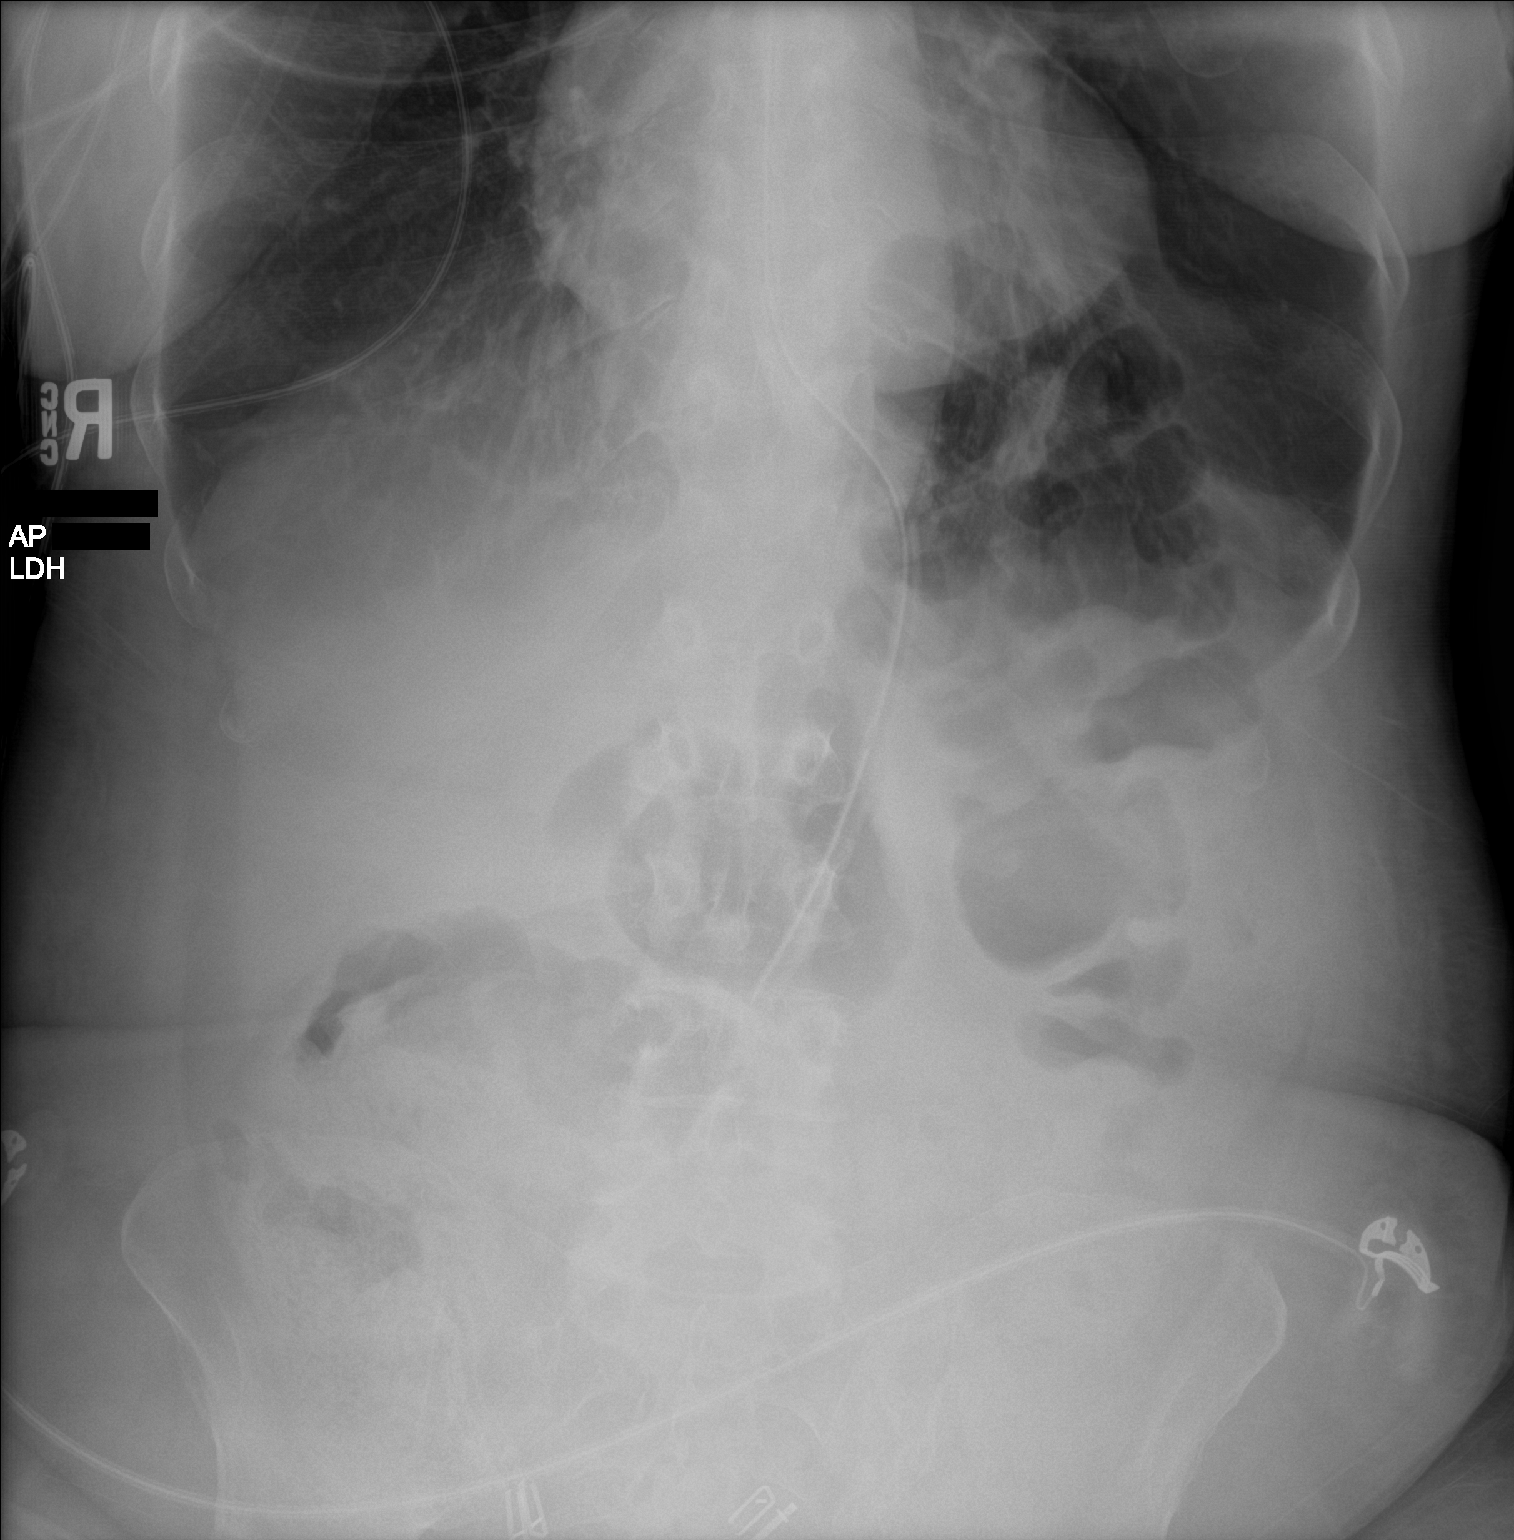

[1 of 1 positions shown; findings below may reference images not displayed]

FINDINGS: Orogastric tube tip projects in mid stomach, advanced from prior
radiograph. Included bowel gas pattern is nondilated and
nonobstructive. No intra-abdominal mass effect or pathologic
calcifications. Surgical clips project in the included pelvis. Soft
tissue planes and included osseous structures are unchanged.
IMPRESSION: Advanced orogastric tube tip projects in mid stomach.

## 2019-02-01 IMAGING — DX DG CHEST 1V PORT
1 series · 1 of 1 positions shown · non-contrast
Comparison: Two days ago

CLINICAL DATA: Mechanically assisted ventilation

EXAM:
PORTABLE CHEST 1 VIEW

[chest ap]
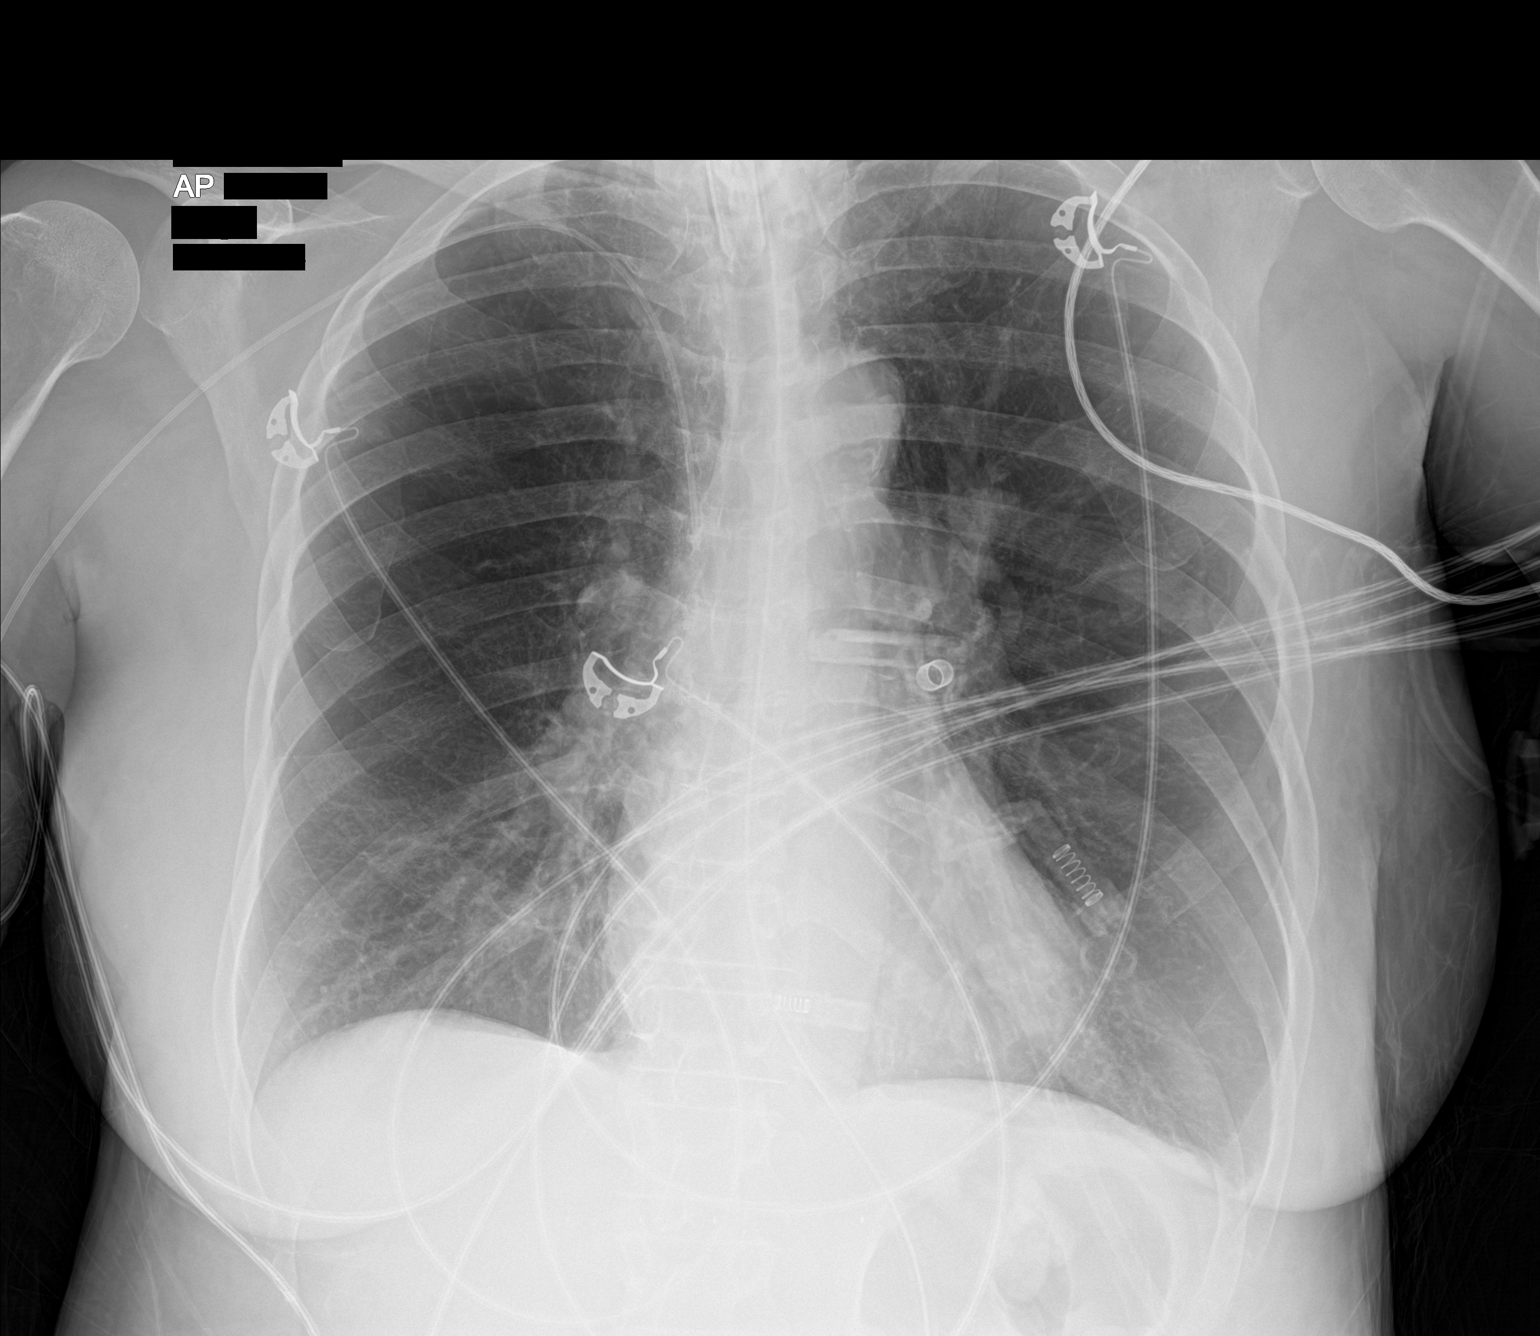

[1 of 1 positions shown; findings below may reference images not displayed]

FINDINGS: Tracheostomy tube is well seated. An orogastric tube reaches the
stomach. Right upper extremity PICC with tip at the SVC level. Large
lung volumes. There is no edema, consolidation, effusion, or
pneumothorax.
IMPRESSION: 1. Stable positioning of tubes and central line.
2. COPD.  No acute superimposed finding.

## 2019-02-03 ENCOUNTER — Telehealth: Payer: Self-pay | Admitting: Primary Care

## 2019-02-03 NOTE — Telephone Encounter (Signed)
T/c from daughter who is concerned about increased anxiety. We discussed going back up on Paxil to 30 mg, and discussing adding ativan to clonazepam.  She will see PCP on Friday and should discuss panic attacks and these med changes with Dr. Sherril Cong. We also discussed some mindfulness exercises and some pastoral visits. In addition, she may benefit from RT working with her for breathing techniques. Daughter to phone oxygen company to discuss RT coming out to see pt. No one has home visited since March.

## 2019-02-04 IMAGING — DX DG CHEST 1V PORT
1 series · 1 of 1 positions shown · non-contrast
Comparison: A single view of the chest 10/19/2016 and 10/17/2016.

CLINICAL DATA: Severe shortness of breath in a patient with a
history of COPD.

EXAM:
PORTABLE CHEST 1 VIEW

[chest ap]
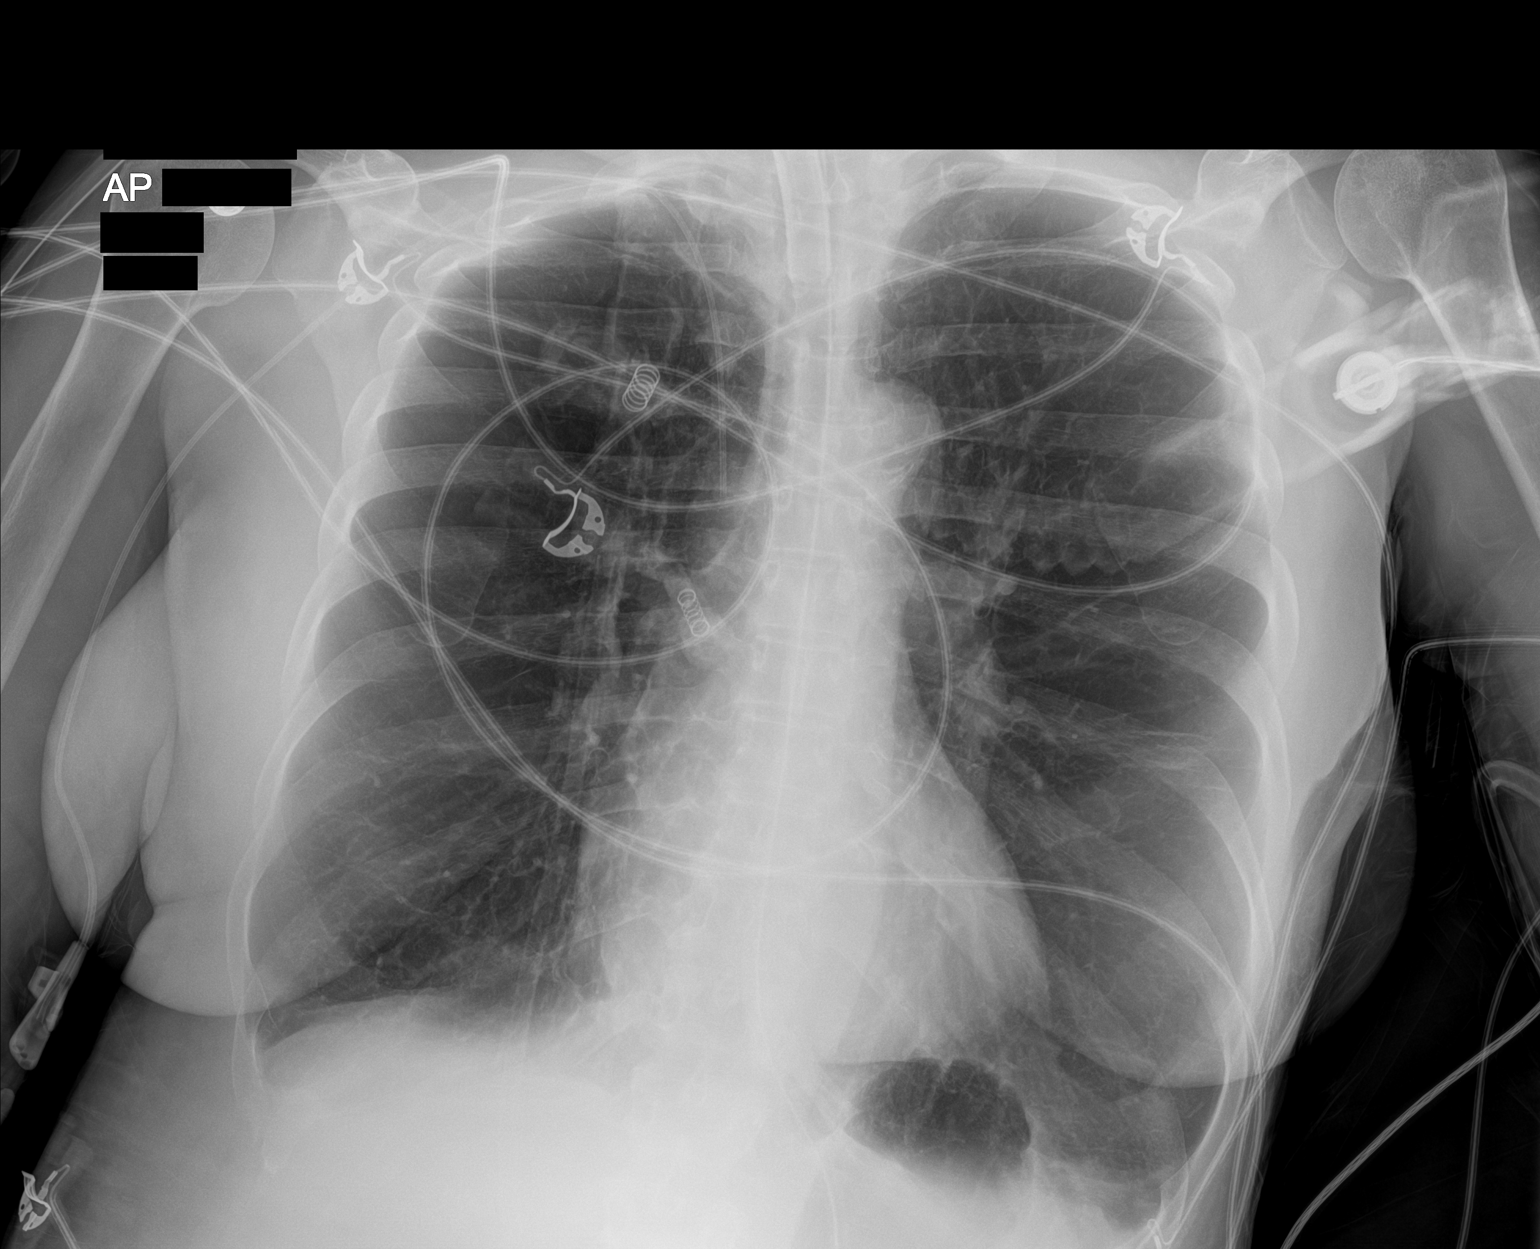

[1 of 1 positions shown; findings below may reference images not displayed]

FINDINGS: Tracheostomy tube, NG tube and right PICC are unchanged. The lungs
are emphysematous but clear. Heart size is normal. No pneumothorax
or pleural effusion.
IMPRESSION: No acute disease.

Support apparatus projects in good position.

Emphysema.

## 2019-02-05 IMAGING — DX DG CHEST 1V PORT
1 series · 1 of 1 positions shown · non-contrast
Comparison: 10/22/2017

CLINICAL DATA: Acute respiratory failure

EXAM:
PORTABLE CHEST 1 VIEW

[chest ap]
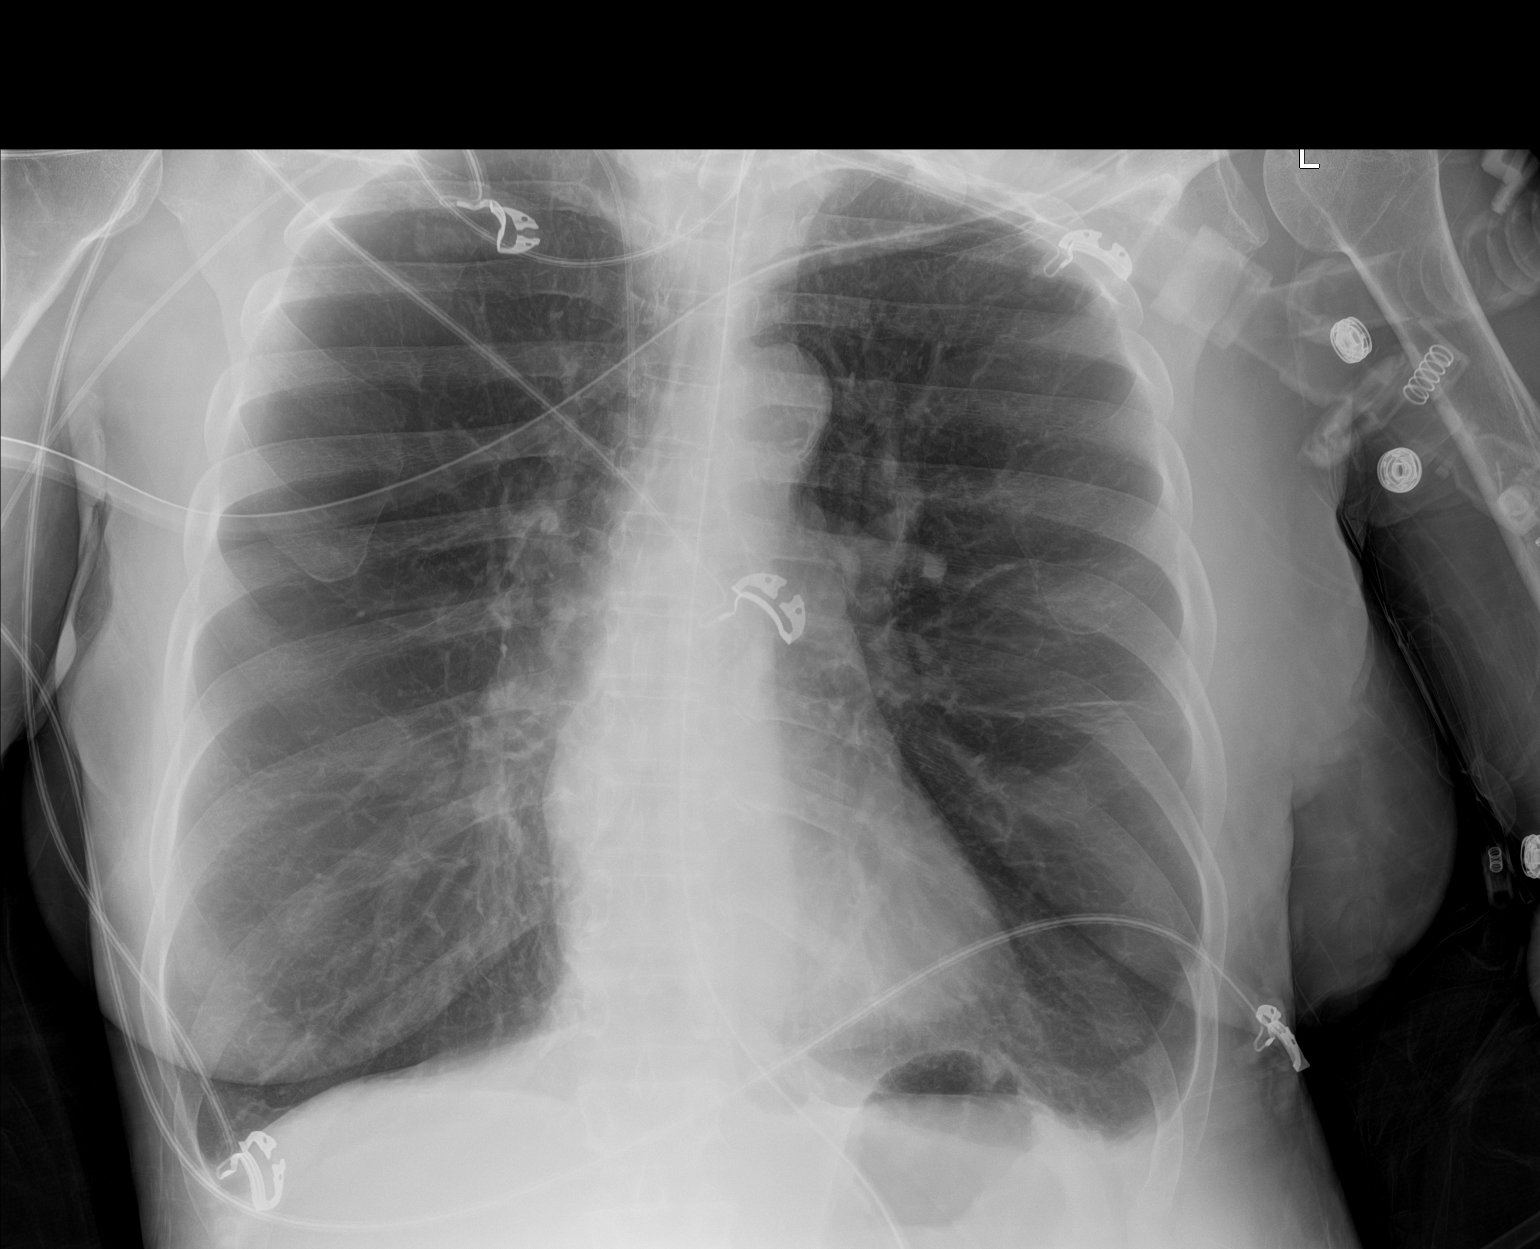

[1 of 1 positions shown; findings below may reference images not displayed]

FINDINGS: Tracheostomy, right PICC line and NG tube remain in place,
unchanged. There is hyperinflation of the lungs compatible with
COPD. Heart is normal size. No confluent opacities or effusions. No
acute bony abnormality.
IMPRESSION: COPD.  No active disease.

## 2019-02-06 ENCOUNTER — Other Ambulatory Visit: Payer: Medicaid Other | Admitting: Primary Care

## 2019-02-06 ENCOUNTER — Other Ambulatory Visit: Payer: Self-pay

## 2019-02-06 DIAGNOSIS — Z515 Encounter for palliative care: Secondary | ICD-10-CM

## 2019-02-06 NOTE — Progress Notes (Signed)
Patient was not at home, she was running late home from a doctor visit. NP Arrived and found a relative home. T/c to Daughter [name] said she was prescribed ativan which will help with her anxiety attacks. We decided we would reschedule for next week.

## 2019-02-11 IMAGING — DX DG CHEST 1V PORT
1 series · 2 of 2 positions shown · non-contrast
Comparison: 10/23/2017 chest x-ray.

CLINICAL DATA: 57-year-old female with tracheostomy tube in place.
Subsequent encounter.

EXAM:
PORTABLE CHEST 1 VIEW

[Series 1: chest ap · 0.14mm/px · 2 of 2 slices shown]
[im 1/2]
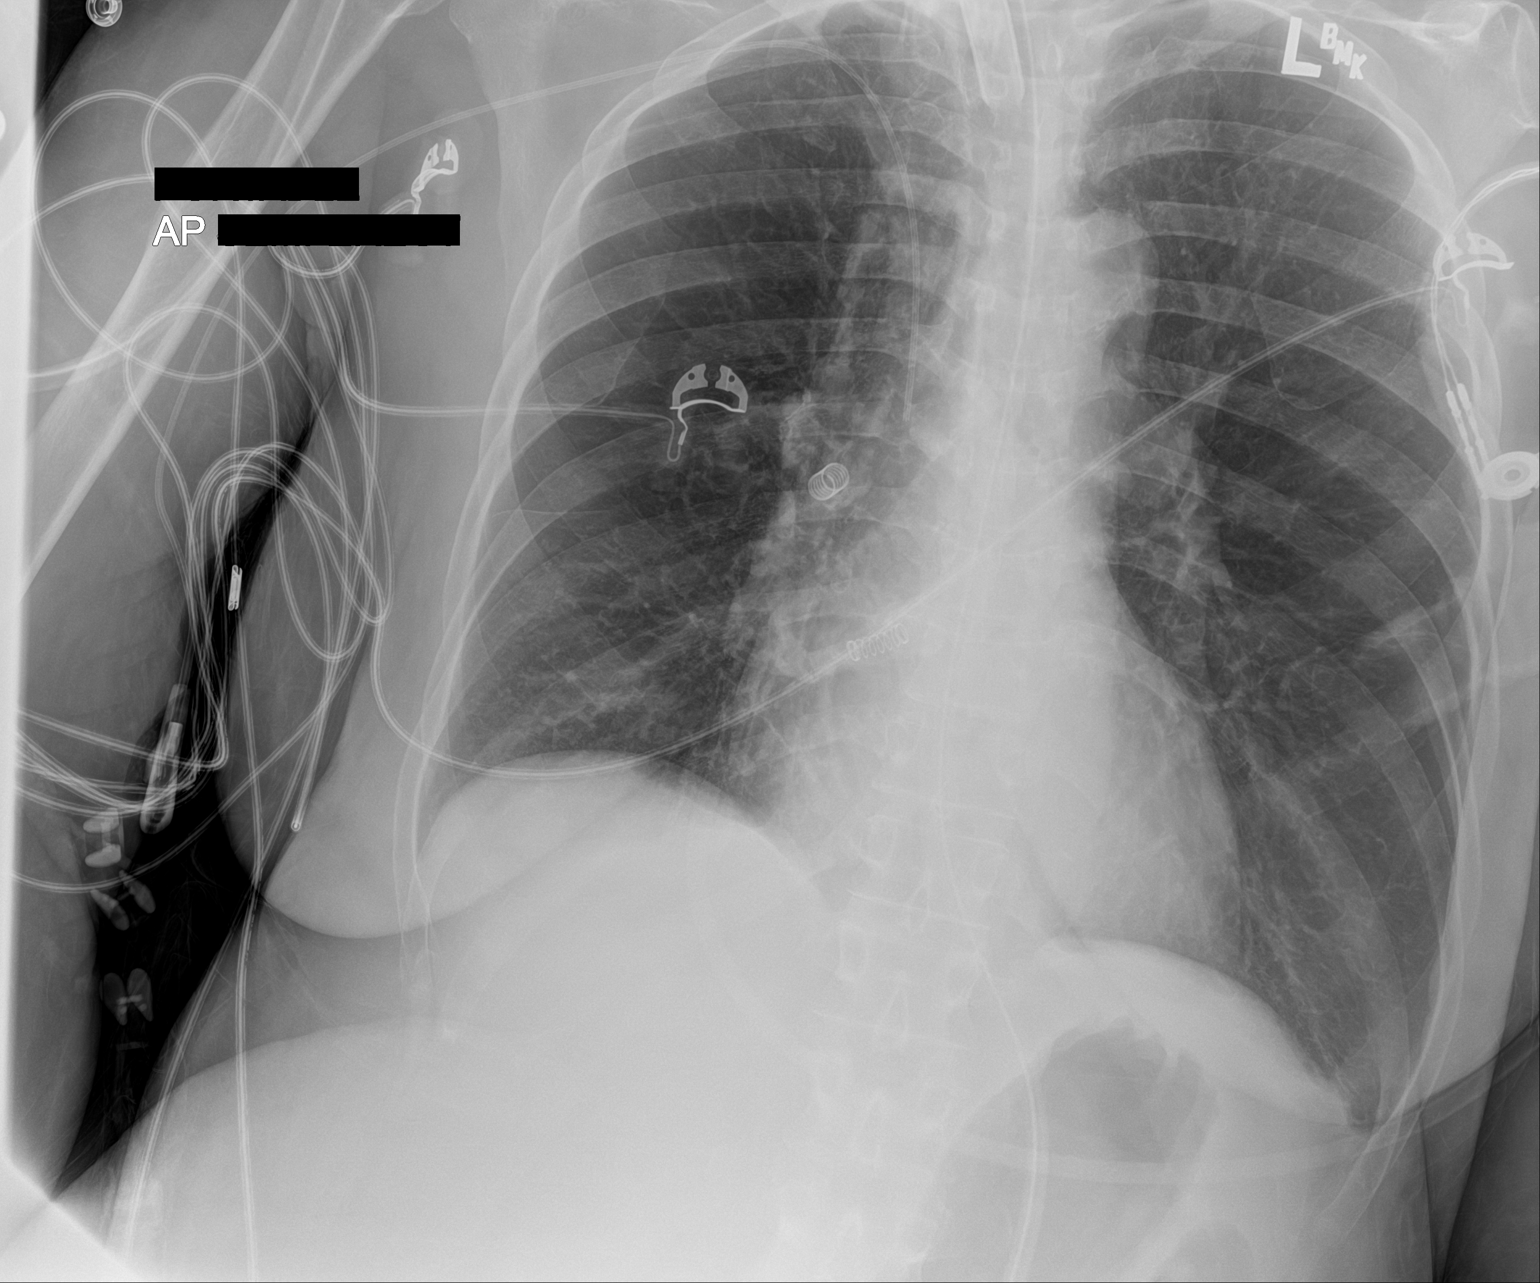
[im 2/2]
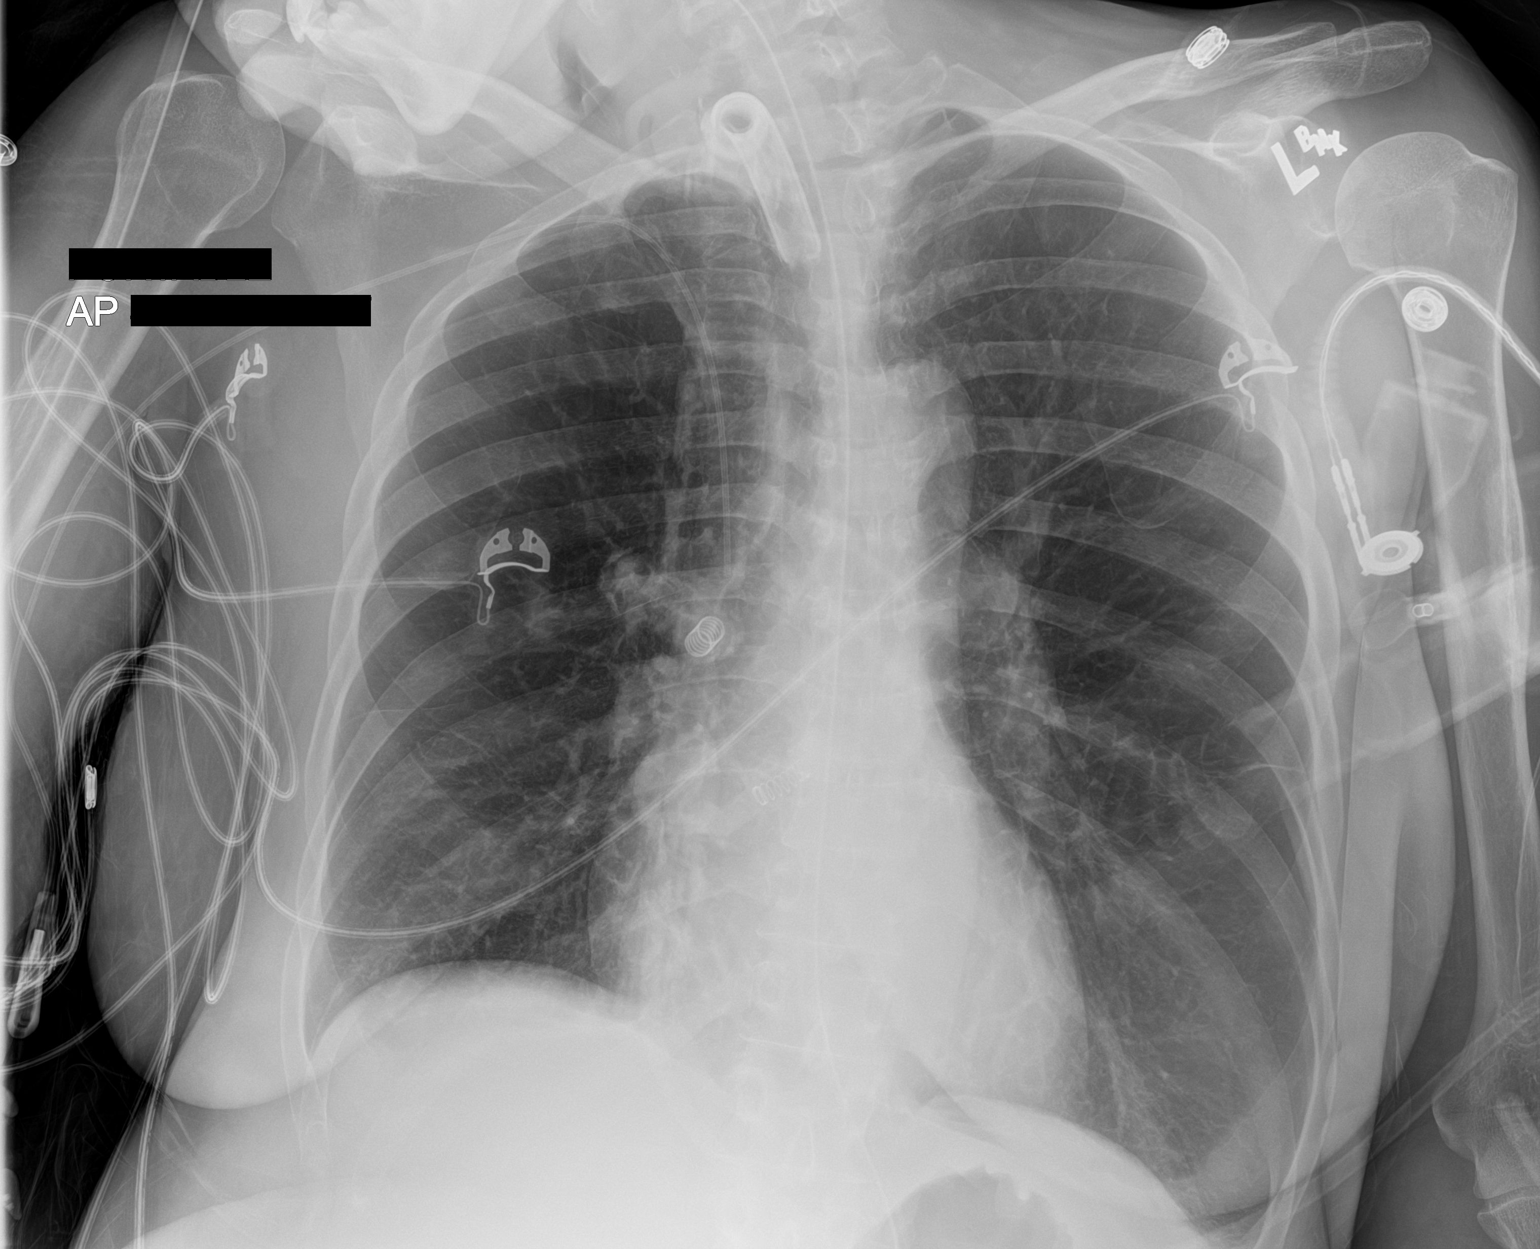

[2 of 2 positions shown; findings below may reference images not displayed]

FINDINGS: Tracheostomy tube tip midline 7.8 cm above the carina.

Right central line tip proximal superior vena cava level.

Nasogastric tube courses below the diaphragm. Tip is not included on
the present exam.

Hyperinflated lungs/COPD.

No infiltrate, congestive heart failure or pneumothorax.

Subsegmental atelectasis medial aspect right lung base.

Heart size within normal limits.

Calcified aorta.
IMPRESSION: Hyperinflated lungs/COPD.

Subsegmental atelectasis medial aspect right lung base.

Aortic Atherosclerosis ([id number]).

## 2019-02-13 IMAGING — DX DG CHEST 1V PORT
1 series · 1 of 1 positions shown · non-contrast
Comparison: 10/29/2017

CLINICAL DATA: Atelectasis

EXAM:
PORTABLE CHEST 1 VIEW

[chest ap]
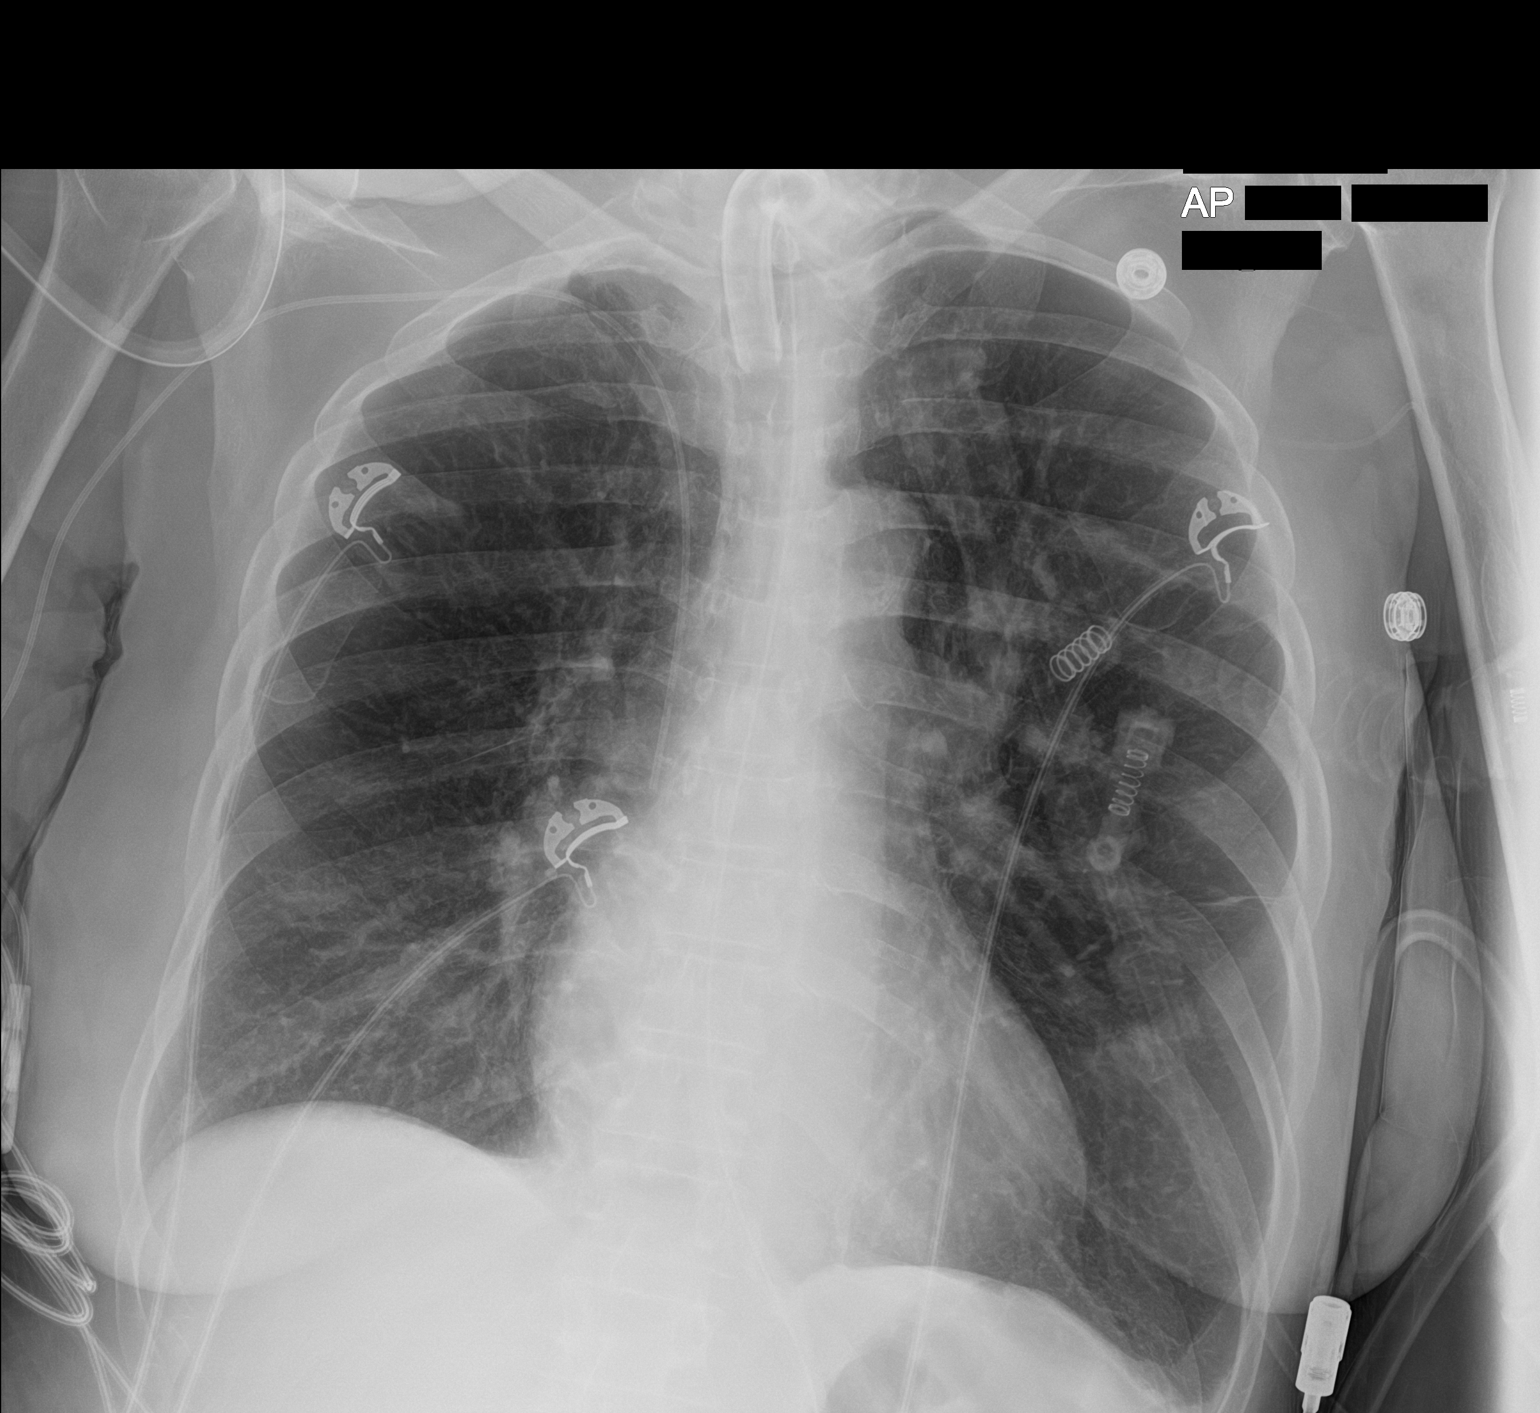

[1 of 1 positions shown; findings below may reference images not displayed]

FINDINGS: Support devices are stable. Heart is normal size. Mild
hyperinflation of the lungs. No confluent opacities or effusions. No
acute bony abnormality.
IMPRESSION: Mild hyperinflation/COPD.  No active disease.

## 2019-02-13 IMAGING — XA IR PERC PLACEMENT GASTROSTOMY
8 series · 9 of 9 positions shown · non-contrast
Comparison: Chest CT - 08/23/2017

INDICATION: Dysphagia. Please perform percutaneous gastrostomy tube placement
for enteric nutrition supplementation purposes.

EXAM:
PULL TROUGH GASTROSTOMY TUBE PLACEMENT

[Series 1: fl - angio · 1 of 1 slices shown (1 of 7)]
[im 1/1]
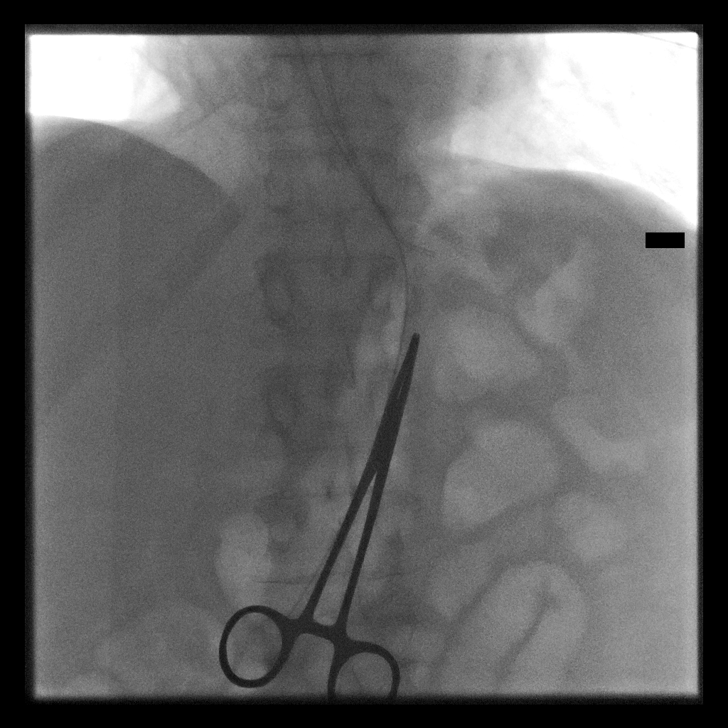

[Series 2: fl - angio · 1 of 1 slices shown (2 of 7)]
[im 1/1]
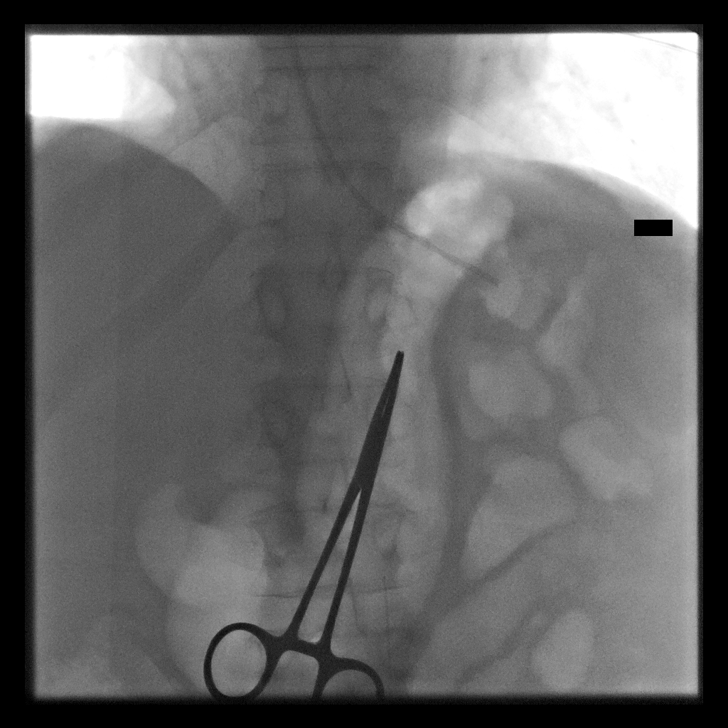

[Series 3: fl - angio · 1 of 1 slices shown (3 of 7)]
[im 1/1]
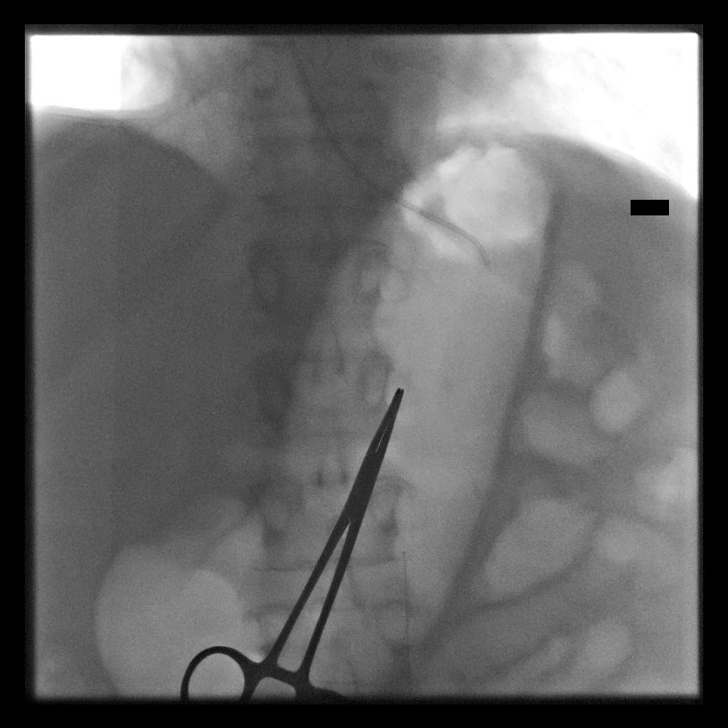

[Series 4: fl - angio · 1 of 1 slices shown (4 of 7)]
[im 1/1]
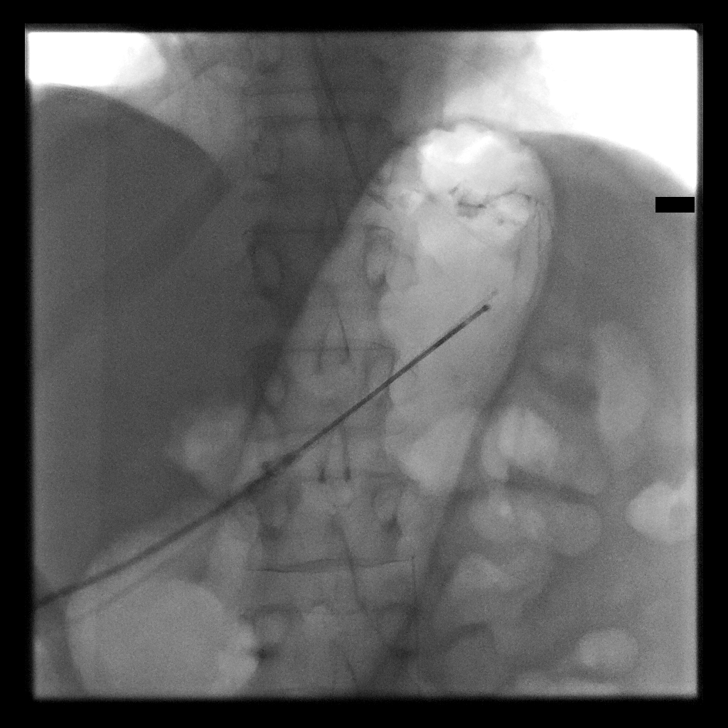

[Series 5: fl - angio · 1 of 1 slices shown (5 of 7)]
[im 1/1]
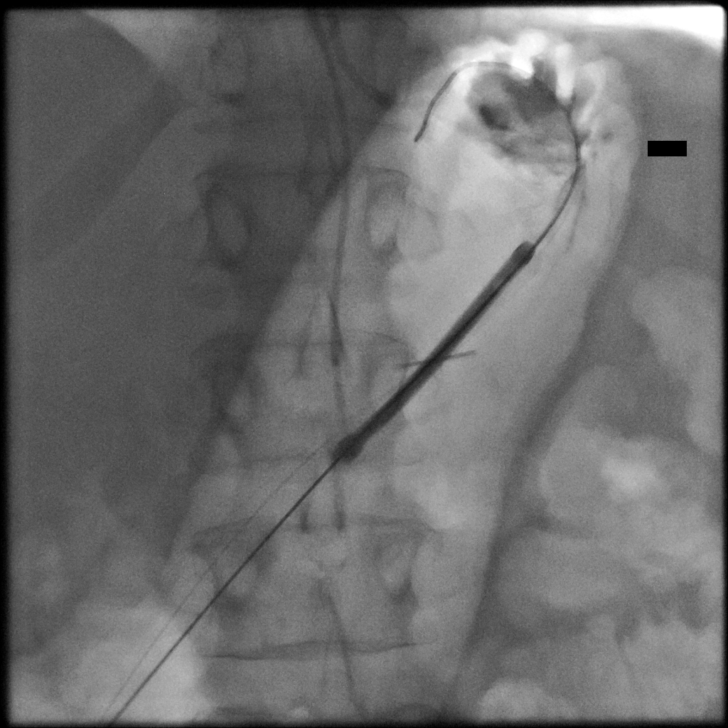

[Series 6: fl - angio · 2 of 2 slices shown (6 of 7)]
[im 1/2]
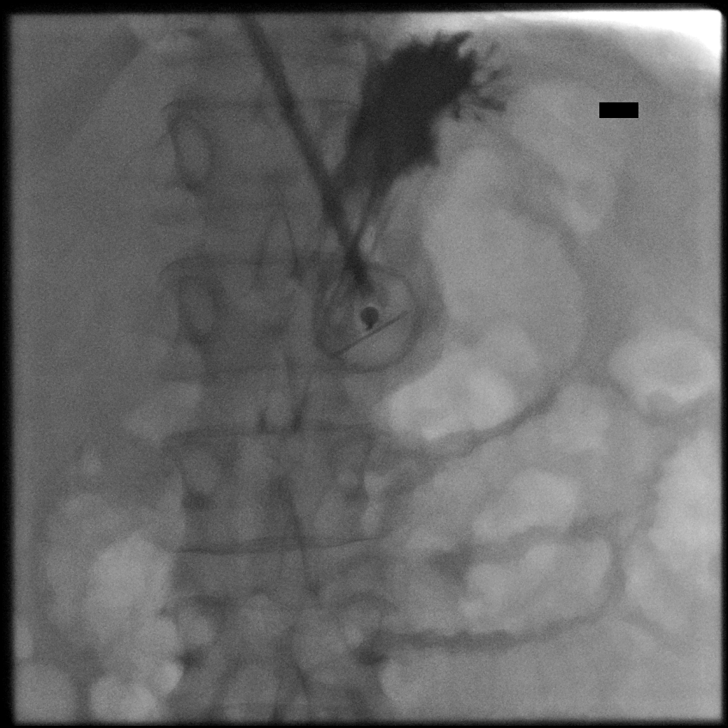
[im 2/2]
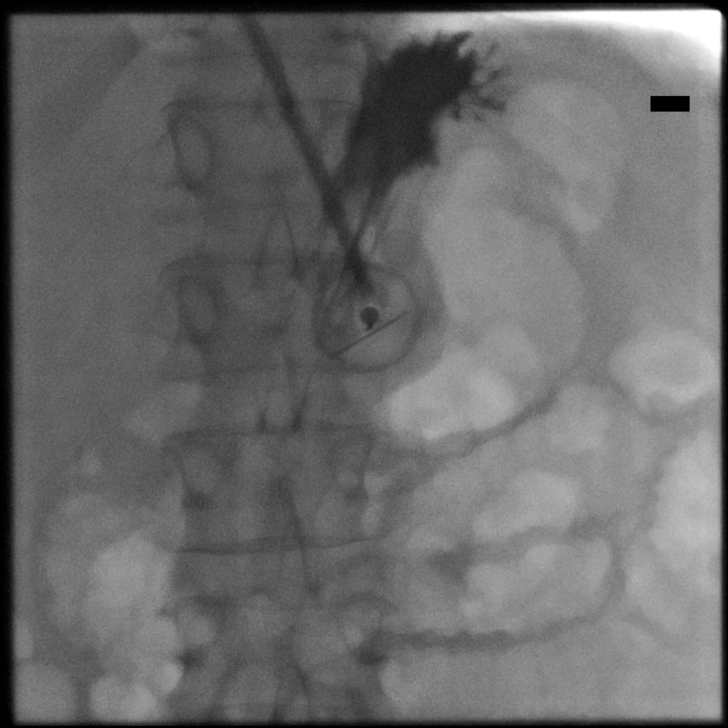

[Series 7: single · 1 of 1 slices shown]
[im 1/1]
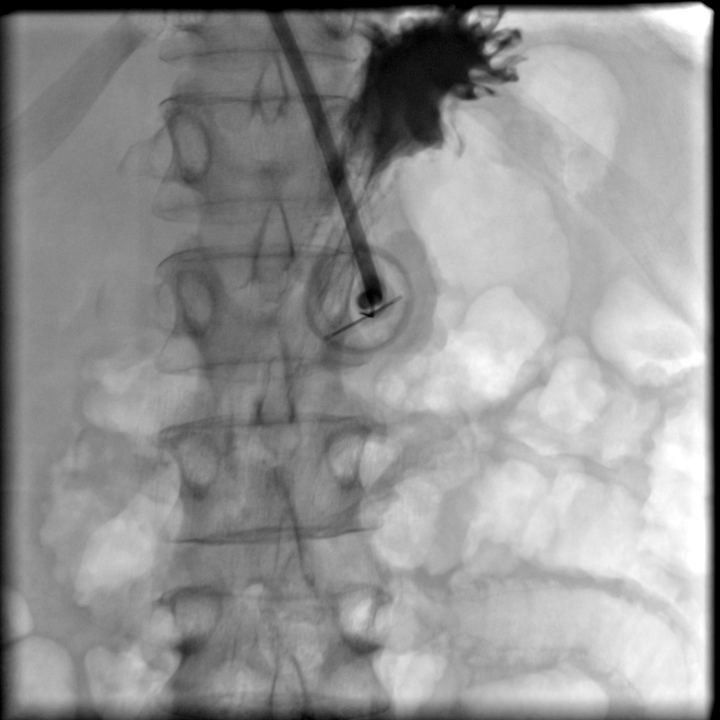

[Series 8: fl - angio · 1 of 1 slices shown (7 of 7)]
[im 1/1]
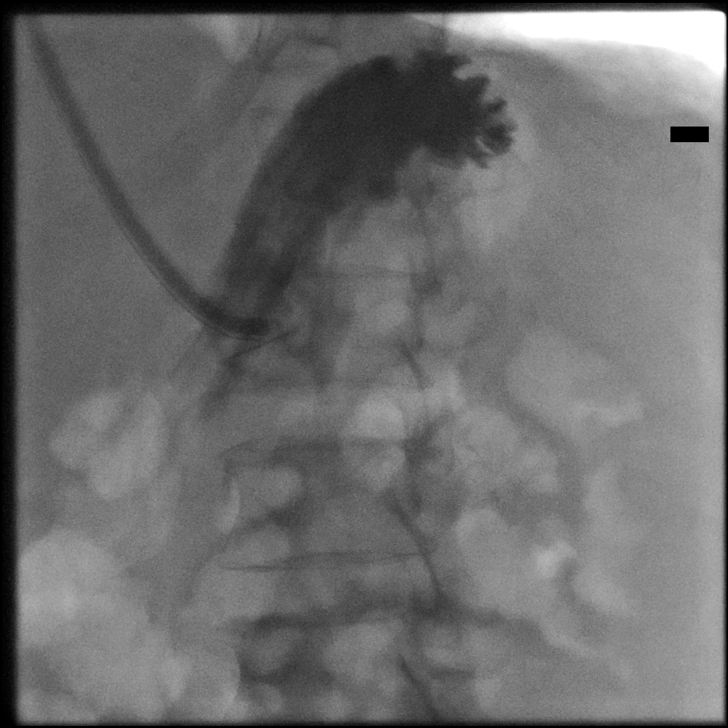

[9 of 9 positions shown; findings below may reference images not displayed]

MEDICATIONS:
Ancef 2 gm IV; Antibiotics were administered within 1 hour of the
procedure.

Glucagon 1 mg IV

CONTRAST:  20 cc Isovue 300 administered into the gastric lumen.

ANESTHESIA/SEDATION:
Moderate (conscious) sedation was employed during this procedure. A
total of Versed 2 mg and Fentanyl 100 mcg was administered
intravenously.

Moderate Sedation Time: 12 minutes. The patient's level of
consciousness and vital signs were monitored continuously by
radiology nursing throughout the procedure under my direct
supervision.

FLUOROSCOPY TIME:  1 minutes 24 seconds (20.2 mGy)

COMPLICATIONS:
None immediate.

PROCEDURE:
Informed written consent was obtained from the patient's family
following explanation of the procedure, risks, benefits and
alternatives. A time out was performed prior to the initiation of
the procedure. Ultrasound scanning was performed to demarcate the
edge of the left lobe of the liver. Maximal barrier sterile
technique utilized including caps, mask, sterile gowns, sterile
gloves, large sterile drape, hand hygiene and Betadine prep.

The left upper quadrant was sterilely prepped and draped. An oral
gastric catheter was inserted into the stomach under fluoroscopy.
The existing nasogastric feeding tube was removed. The left costal
margin and air opacified transverse colon were identified and
avoided. Air was injected into the stomach for insufflation and
visualization under fluoroscopy. Under sterile conditions a 17 gauge
trocar needle was utilized to access the stomach percutaneously
beneath the left subcostal margin after the overlying soft tissues
were anesthetized with 1% Lidocaine with epinephrine. Needle
position was confirmed within the stomach with aspiration of air and
injection of small amount of contrast. A single T tack was deployed
for gastropexy. Over an Amplatz guide wire, a 9-French sheath was
inserted into the stomach. A snare device was utilized to capture
the oral gastric catheter. The snare device was pulled retrograde
from the stomach up the esophagus and out the oropharynx. The
20-French pull-through gastrostomy was connected to the snare device
and pulled antegrade through the oropharynx down the esophagus into
the stomach and then through the percutaneous tract external to the
patient. The gastrostomy was assembled externally. Contrast
injection confirms position in the stomach. Several spot
radiographic images were obtained in various obliquities for
documentation. The patient tolerated procedure well without
immediate post procedural complication.
FINDINGS: After successful fluoroscopic guided placement, the gastrostomy tube
is appropriately positioned with internal disc against the ventral
aspect of the gastric lumen.
IMPRESSION: Successful fluoroscopic insertion of a 20-French pull-through
gastrostomy tube.

The gastrostomy may be used immediately for medication
administration and in 24 hrs for the initiation of feeds.

## 2019-02-15 IMAGING — DX DG CHEST 1V PORT
1 series · 1 of 1 positions shown · non-contrast
Comparison: 10/31/2017 and older exams.

CLINICAL DATA: Followup pneumonia.

EXAM:
PORTABLE CHEST 1 VIEW

[chest ap]
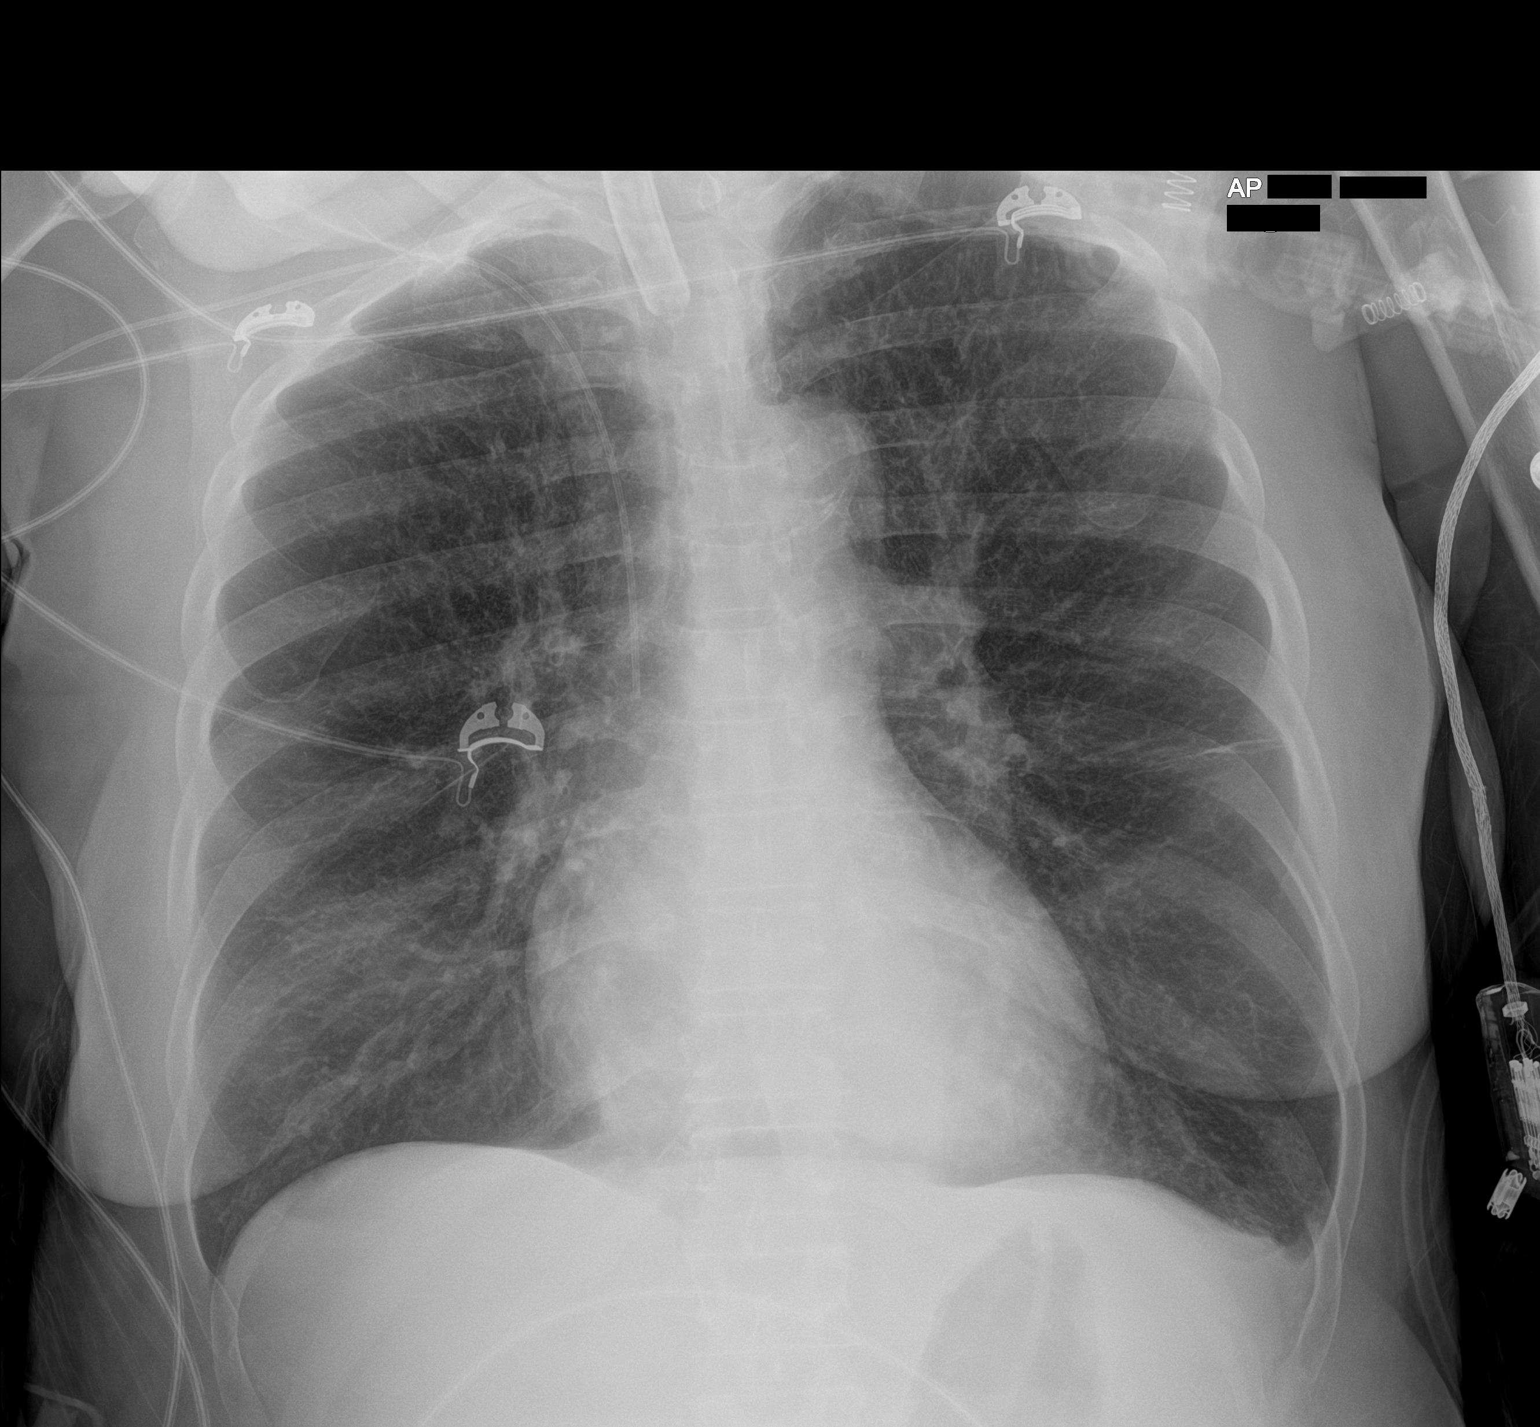

[1 of 1 positions shown; findings below may reference images not displayed]

FINDINGS: Lungs are hyperexpanded with prominent bronchovascular markings
consistent with COPD. Mild linear scarring noted in the left mid
lung is stable. No evidence of pneumonia or pulmonary edema.

Tracheostomy tube and right PICC are stable and well positioned. The
nasal/orogastric tube has been removed since the prior exam.
IMPRESSION: 1. No acute findings.  No evidence of pneumonia or pulmonary edema.
2. COPD.
3. Well-positioned tracheostomy tube and right PICC.

## 2019-02-23 ENCOUNTER — Telehealth: Payer: Self-pay | Admitting: Primary Care

## 2019-02-23 NOTE — Telephone Encounter (Signed)
Several calls made to multiple listed numbers, messages left for return call to se up a visit.

## 2019-02-28 IMAGING — DX DG CHEST 1V PORT
1 series · 1 of 1 positions shown · non-contrast
Comparison: 11/06/2017

CLINICAL DATA: Respiratory failure

EXAM:
PORTABLE CHEST 1 VIEW

[chest ap]
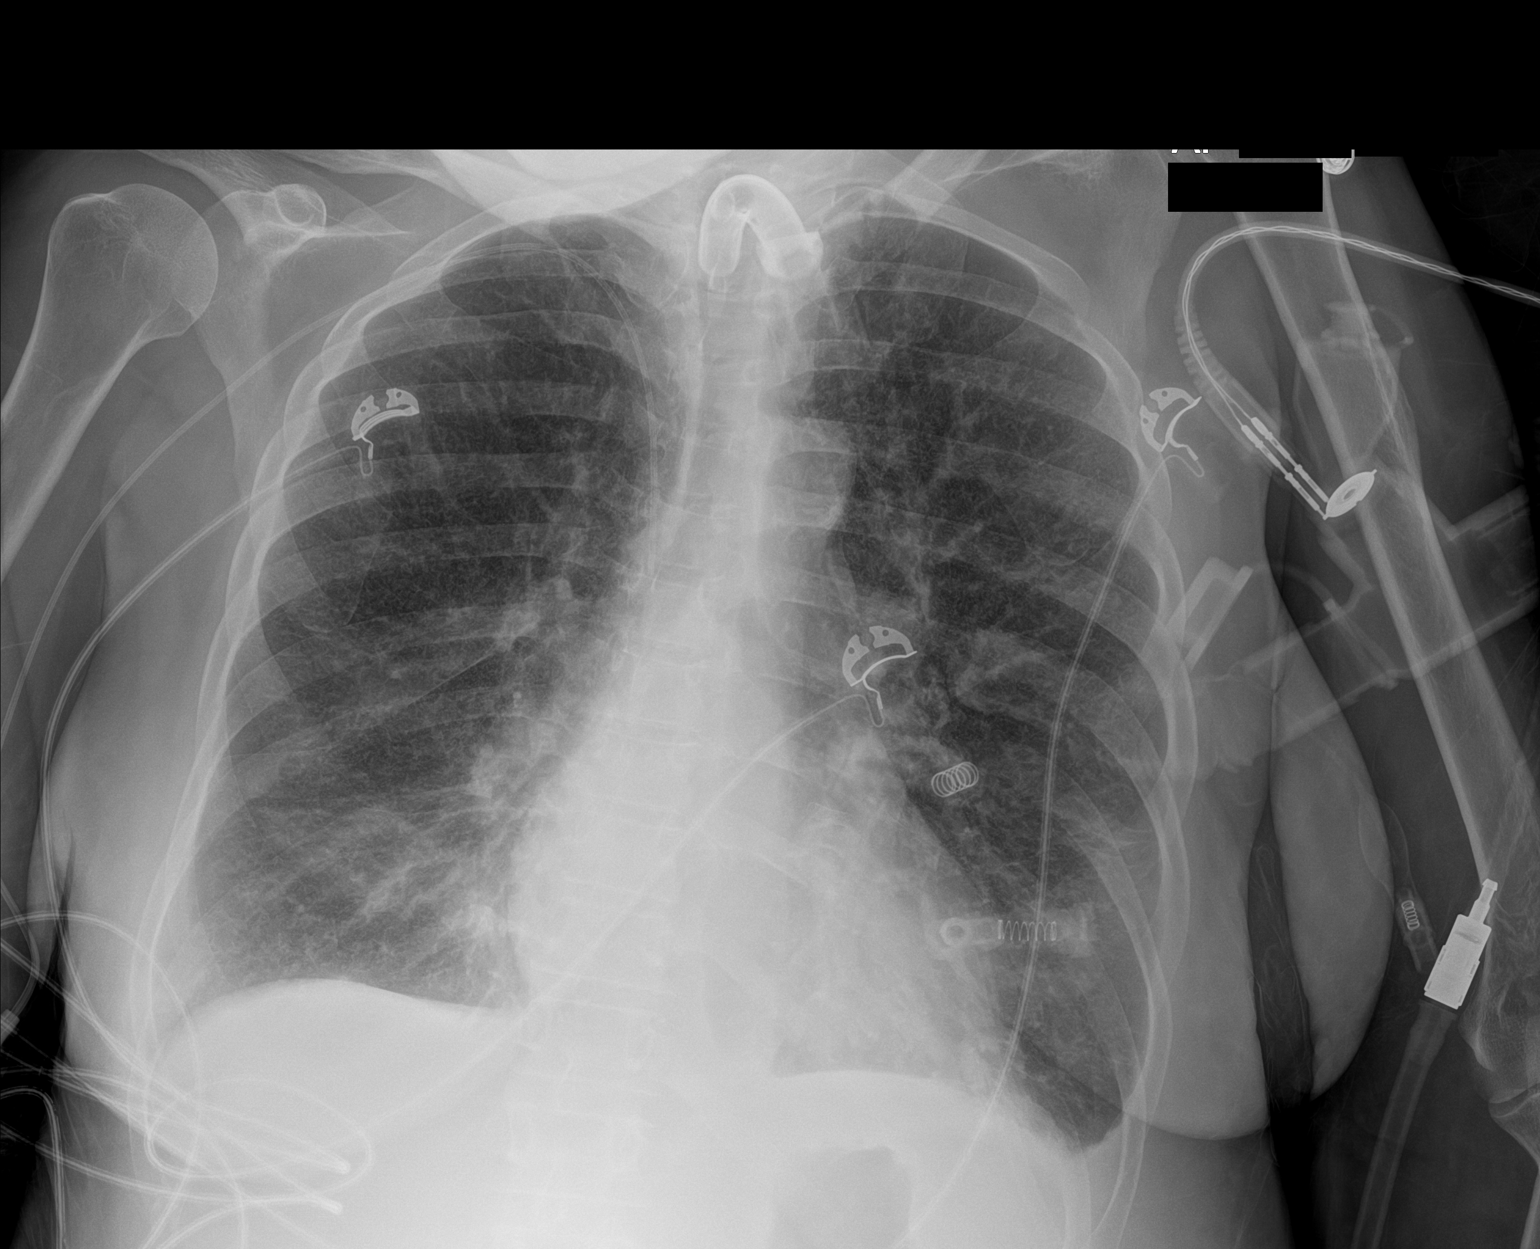

[1 of 1 positions shown; findings below may reference images not displayed]

FINDINGS: Cardiac shadow is stable. Tracheostomy tube and right-sided PICC
line are again noted and stable. The lungs are well aerated
bilaterally. Minimal blunting of the costophrenic angles is again
seen. No new focal infiltrate is seen. The previously noted
interstitial changes are stable in appearance.
IMPRESSION: Stable appearance of the chest when compare with the prior exam.

## 2019-03-02 IMAGING — DX DG CHEST 1V PORT
1 series · 1 of 1 positions shown · non-contrast
Comparison: 11/15/2017

CLINICAL DATA: Patient poor historian at this time. Encounter for
patient on mechanically assisted ventilation.

EXAM:
PORTABLE CHEST - 1 VIEW

[chest ap]
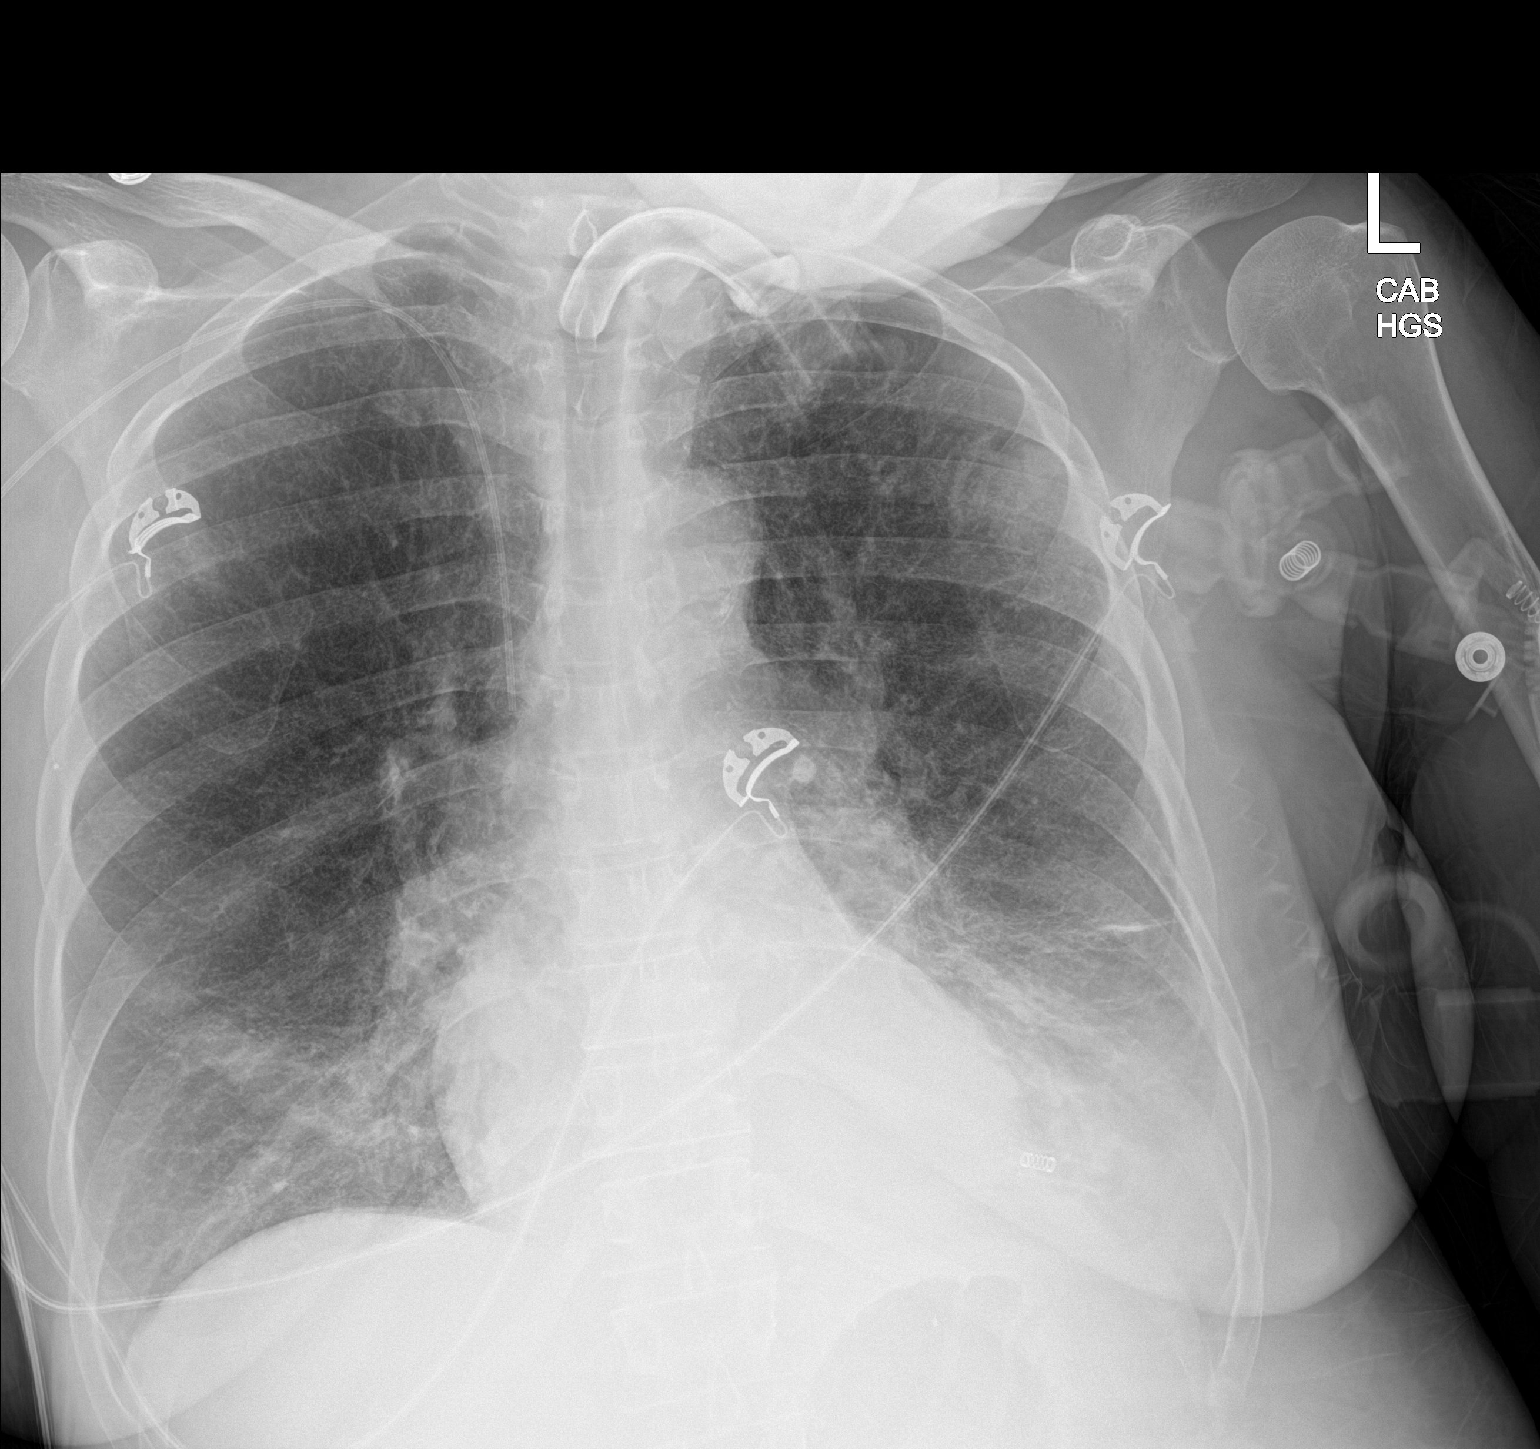

[1 of 1 positions shown; findings below may reference images not displayed]

FINDINGS: Tracheostomy device and right arm PICC stable in position. Worsening
left lower lung airspace consolidation with probable adjacent
effusion. Mild diffuse interstitial prominence, with some increase
in bibasilar interstitial infiltrates or edema.

Heart size normal.  Aortic Atherosclerosis ([id number]-170.0)

No pneumothorax.

Visualized bones unremarkable.
IMPRESSION: 1. Worsening bibasilar infiltrates or edema, left greater than
right.
2. Increasing left pleural effusion.
3.  Support hardware stable in position.

## 2019-03-04 IMAGING — DX DG CHEST 1V PORT
1 series · 2 of 2 positions shown · non-contrast
Comparison: 11/17/2017.

CLINICAL DATA: Respiratory failure.

EXAM:
PORTABLE CHEST 1 VIEW

[Series 1: chest ap · 0.14mm/px · 2 of 2 slices shown]
[im 1/2]
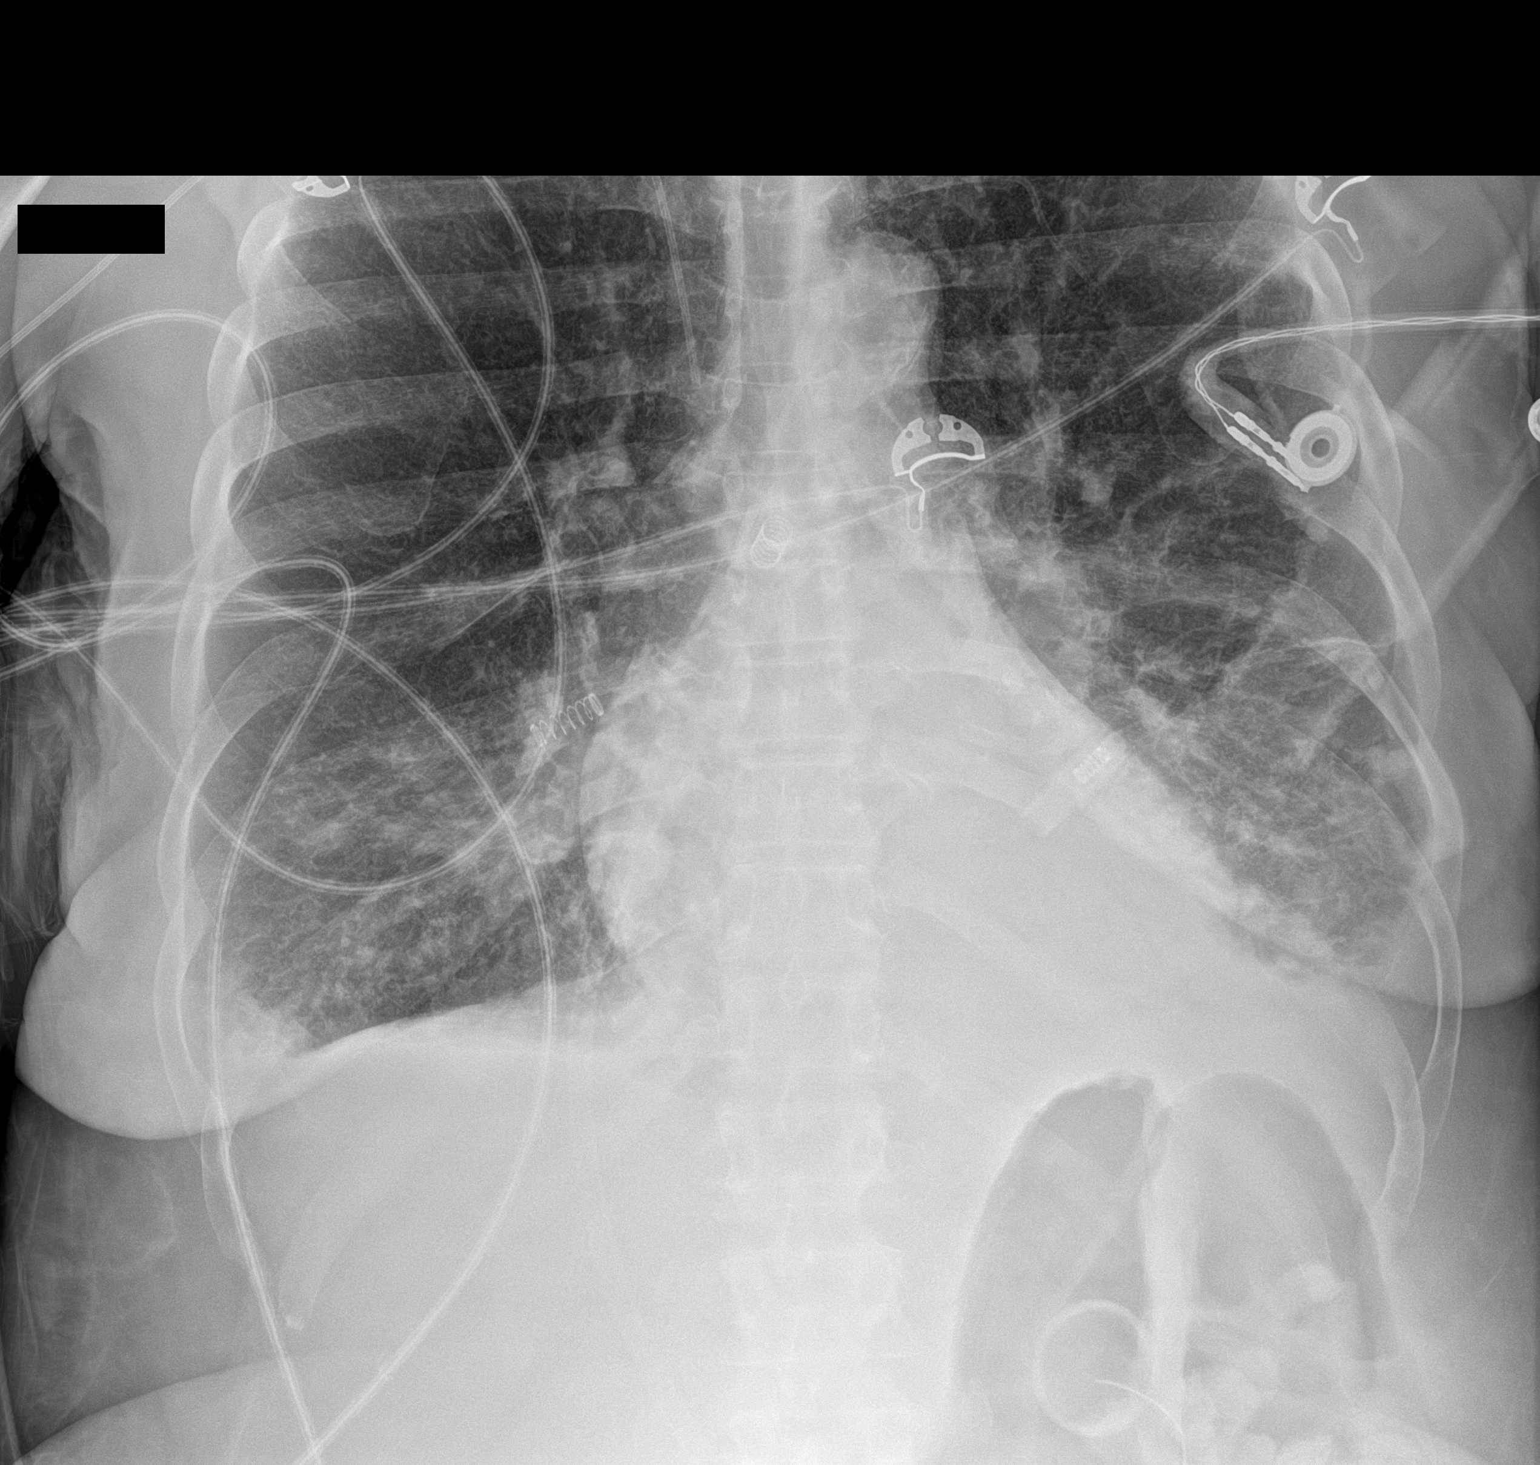
[im 2/2]
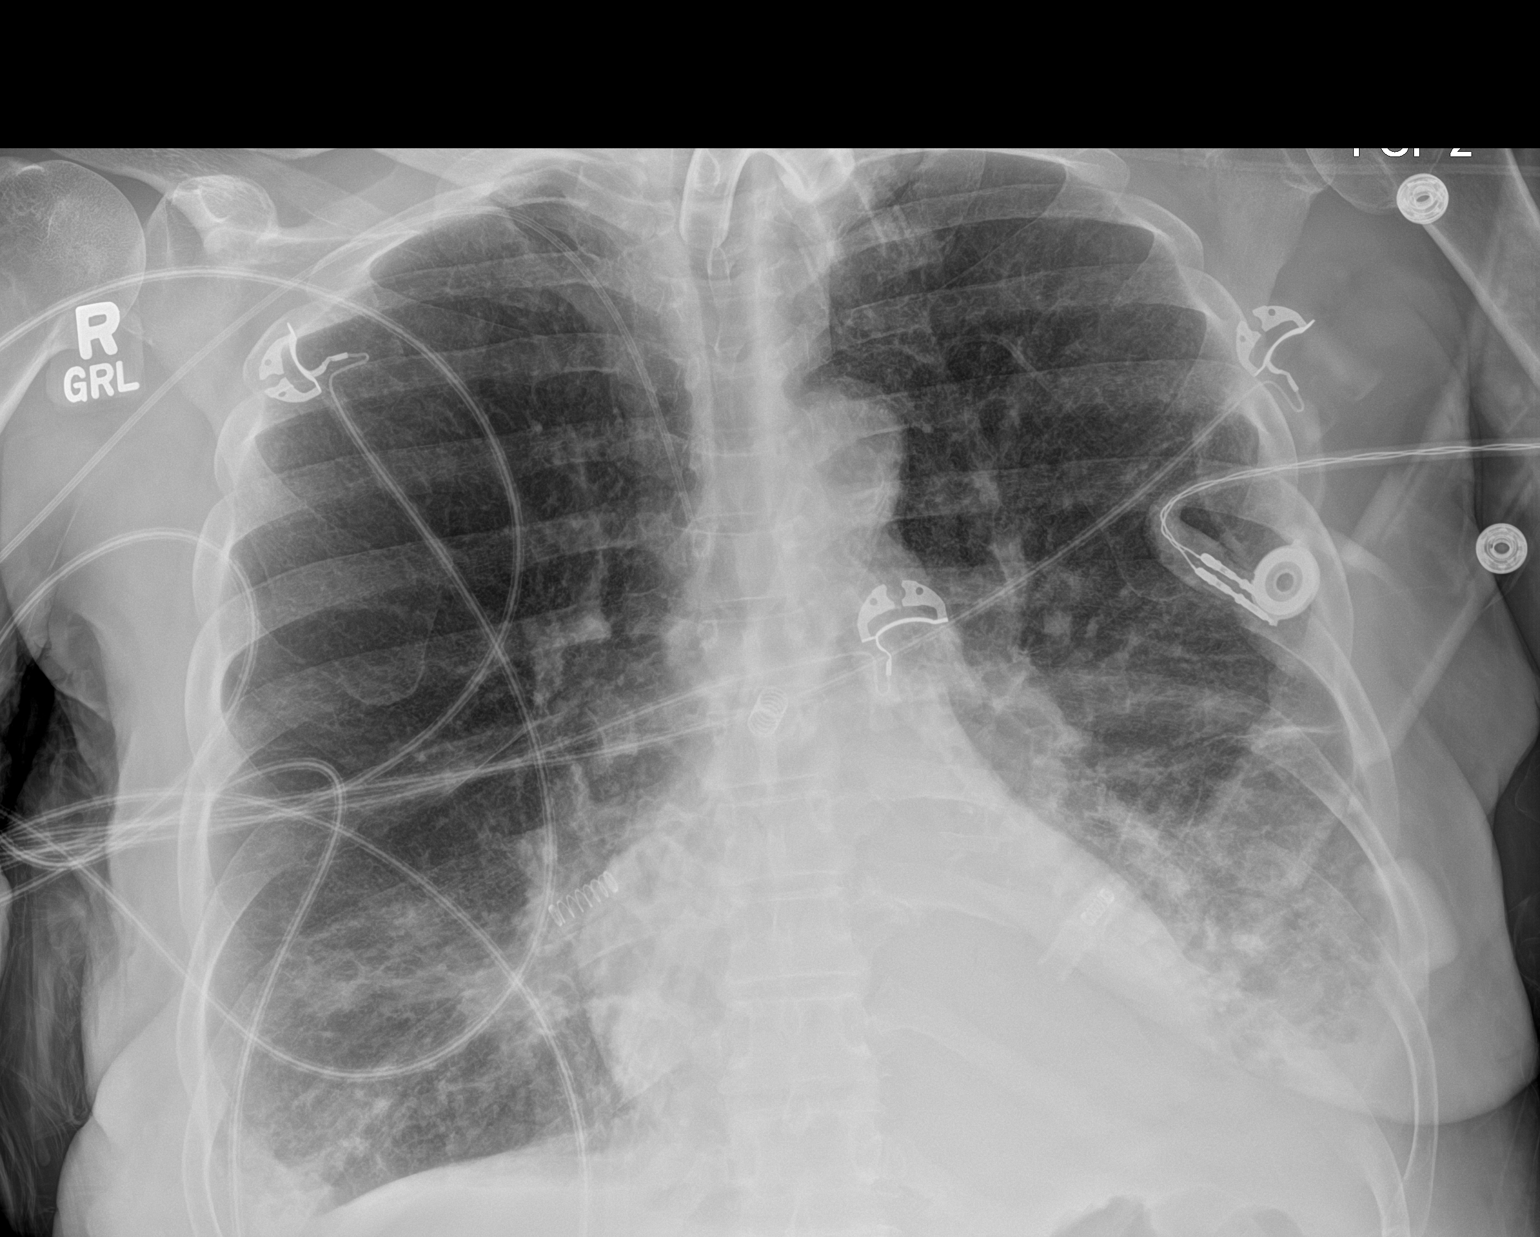

[2 of 2 positions shown; findings below may reference images not displayed]

FINDINGS: Tracheostomy tube noted in stable position. Right PICC line noted in
stable position. Tiny catheter noted over the left upper chest, this
may be outside the patient. Continued evaluation on follow-up exam
suggested. Heart size stable. Continued progression of bibasilar
atelectasis and infiltrates. Small bilateral pleural effusions. No
pneumothorax.
IMPRESSION: 1. Tracheostomy tube and right PICC line stable position. Tiny
catheter is noted over the left upper chest, this may be outside the
patient. Continued evaluation on follow-up exams suggested.

2. Continued progression of bibasilar atelectasis and infiltrates.
Small bilateral pleural effusions.

## 2019-03-08 ENCOUNTER — Emergency Department: Payer: Medicaid Other

## 2019-03-08 ENCOUNTER — Inpatient Hospital Stay
Admission: EM | Admit: 2019-03-08 | Discharge: 2019-03-14 | DRG: 870 | Disposition: A | Discharge status: Home / Self Care | Payer: Medicaid Other | Attending: Internal Medicine | Admitting: Internal Medicine

## 2019-03-08 ENCOUNTER — Other Ambulatory Visit: Payer: Self-pay

## 2019-03-08 DIAGNOSIS — Z66 Do not resuscitate: Secondary | ICD-10-CM | POA: Diagnosis present

## 2019-03-08 DIAGNOSIS — Z79899 Other long term (current) drug therapy: Secondary | ICD-10-CM

## 2019-03-08 DIAGNOSIS — E1165 Type 2 diabetes mellitus with hyperglycemia: Secondary | ICD-10-CM | POA: Diagnosis present

## 2019-03-08 DIAGNOSIS — Z930 Tracheostomy status: Secondary | ICD-10-CM

## 2019-03-08 DIAGNOSIS — Z7952 Long term (current) use of systemic steroids: Secondary | ICD-10-CM

## 2019-03-08 DIAGNOSIS — J439 Emphysema, unspecified: Secondary | ICD-10-CM | POA: Diagnosis present

## 2019-03-08 DIAGNOSIS — J9621 Acute and chronic respiratory failure with hypoxia: Secondary | ICD-10-CM | POA: Diagnosis present

## 2019-03-08 DIAGNOSIS — Z823 Family history of stroke: Secondary | ICD-10-CM

## 2019-03-08 DIAGNOSIS — F410 Panic disorder [episodic paroxysmal anxiety] without agoraphobia: Secondary | ICD-10-CM | POA: Diagnosis present

## 2019-03-08 DIAGNOSIS — I5032 Chronic diastolic (congestive) heart failure: Secondary | ICD-10-CM | POA: Diagnosis present

## 2019-03-08 DIAGNOSIS — A419 Sepsis, unspecified organism: Principal | ICD-10-CM | POA: Diagnosis present

## 2019-03-08 DIAGNOSIS — Z801 Family history of malignant neoplasm of trachea, bronchus and lung: Secondary | ICD-10-CM | POA: Diagnosis not present

## 2019-03-08 DIAGNOSIS — I2510 Atherosclerotic heart disease of native coronary artery without angina pectoris: Secondary | ICD-10-CM | POA: Diagnosis present

## 2019-03-08 DIAGNOSIS — Z87891 Personal history of nicotine dependence: Secondary | ICD-10-CM | POA: Diagnosis not present

## 2019-03-08 DIAGNOSIS — E119 Type 2 diabetes mellitus without complications: Secondary | ICD-10-CM | POA: Diagnosis present

## 2019-03-08 DIAGNOSIS — J9811 Atelectasis: Secondary | ICD-10-CM | POA: Diagnosis present

## 2019-03-08 DIAGNOSIS — D509 Iron deficiency anemia, unspecified: Secondary | ICD-10-CM | POA: Diagnosis present

## 2019-03-08 DIAGNOSIS — I110 Hypertensive heart disease with heart failure: Secondary | ICD-10-CM | POA: Diagnosis present

## 2019-03-08 DIAGNOSIS — J9622 Acute and chronic respiratory failure with hypercapnia: Secondary | ICD-10-CM | POA: Diagnosis present

## 2019-03-08 DIAGNOSIS — Z8249 Family history of ischemic heart disease and other diseases of the circulatory system: Secondary | ICD-10-CM

## 2019-03-08 DIAGNOSIS — E785 Hyperlipidemia, unspecified: Secondary | ICD-10-CM | POA: Diagnosis present

## 2019-03-08 DIAGNOSIS — Z825 Family history of asthma and other chronic lower respiratory diseases: Secondary | ICD-10-CM

## 2019-03-08 DIAGNOSIS — J9600 Acute respiratory failure, unspecified whether with hypoxia or hypercapnia: Secondary | ICD-10-CM

## 2019-03-08 DIAGNOSIS — Z20828 Contact with and (suspected) exposure to other viral communicable diseases: Secondary | ICD-10-CM | POA: Diagnosis present

## 2019-03-08 DIAGNOSIS — G894 Chronic pain syndrome: Secondary | ICD-10-CM | POA: Diagnosis present

## 2019-03-08 DIAGNOSIS — Z7982 Long term (current) use of aspirin: Secondary | ICD-10-CM

## 2019-03-08 DIAGNOSIS — Z803 Family history of malignant neoplasm of breast: Secondary | ICD-10-CM

## 2019-03-08 DIAGNOSIS — Z9911 Dependence on respirator [ventilator] status: Secondary | ICD-10-CM

## 2019-03-08 DIAGNOSIS — Z7951 Long term (current) use of inhaled steroids: Secondary | ICD-10-CM

## 2019-03-08 DIAGNOSIS — J151 Pneumonia due to Pseudomonas: Secondary | ICD-10-CM | POA: Diagnosis present

## 2019-03-08 DIAGNOSIS — J441 Chronic obstructive pulmonary disease with (acute) exacerbation: Secondary | ICD-10-CM

## 2019-03-08 DIAGNOSIS — Z79891 Long term (current) use of opiate analgesic: Secondary | ICD-10-CM

## 2019-03-08 LAB — CBC WITH DIFFERENTIAL/PLATELET
Abs Immature Granulocytes: 0.04 10*3/uL (ref 0.00–0.07)
Basophils Absolute: 0 10*3/uL (ref 0.0–0.1)
Basophils Relative: 0 %
Eosinophils Absolute: 0.2 10*3/uL (ref 0.0–0.5)
Eosinophils Relative: 1 %
HCT: 36 % (ref 36.0–46.0)
Hemoglobin: 10.5 g/dL — ABNORMAL LOW (ref 12.0–15.0)
Immature Granulocytes: 0 %
Lymphocytes Relative: 9 %
Lymphs Abs: 1.2 10*3/uL (ref 0.7–4.0)
MCH: 23.5 pg — ABNORMAL LOW (ref 26.0–34.0)
MCHC: 29.2 g/dL — ABNORMAL LOW (ref 30.0–36.0)
MCV: 80.7 fL (ref 80.0–100.0)
Monocytes Absolute: 1.1 10*3/uL — ABNORMAL HIGH (ref 0.1–1.0)
Monocytes Relative: 8 %
Neutro Abs: 10.6 10*3/uL — ABNORMAL HIGH (ref 1.7–7.7)
Neutrophils Relative %: 82 %
Platelets: 279 10*3/uL (ref 150–400)
RBC: 4.46 MIL/uL (ref 3.87–5.11)
RDW: 15.2 % (ref 11.5–15.5)
WBC: 13 10*3/uL — ABNORMAL HIGH (ref 4.0–10.5)
nRBC: 0 % (ref 0.0–0.2)

## 2019-03-08 LAB — BLOOD GAS, VENOUS
Acid-base deficit: 1.4 mmol/L (ref 0.0–2.0)
Bicarbonate: 26.3 mmol/L (ref 20.0–28.0)
O2 Saturation: 91 %
Patient temperature: 37
pCO2, Ven: 56 mmHg (ref 44.0–60.0)
pH, Ven: 7.28 (ref 7.250–7.430)
pO2, Ven: 69 mmHg — ABNORMAL HIGH (ref 32.0–45.0)

## 2019-03-08 LAB — COMPREHENSIVE METABOLIC PANEL
ALT: 21 U/L (ref 0–44)
AST: 31 U/L (ref 15–41)
Albumin: 4.1 g/dL (ref 3.5–5.0)
Alkaline Phosphatase: 122 U/L (ref 38–126)
Anion gap: 11 (ref 5–15)
BUN: 8 mg/dL (ref 6–20)
CO2: 22 mmol/L (ref 22–32)
Calcium: 9.1 mg/dL (ref 8.9–10.3)
Chloride: 106 mmol/L (ref 98–111)
Creatinine, Ser: 0.66 mg/dL (ref 0.44–1.00)
GFR calc Af Amer: >60 mL/min (ref >60)
GFR calc non Af Amer: >60 mL/min (ref >60)
Glucose, Bld: 176 mg/dL — ABNORMAL HIGH (ref 70–99)
Potassium: 4.2 mmol/L (ref 3.5–5.1)
Sodium: 139 mmol/L (ref 135–145)
Total Bilirubin: 0.7 mg/dL (ref 0.3–1.2)
Total Protein: 8 g/dL (ref 6.5–8.1)

## 2019-03-08 LAB — SARS CORONAVIRUS 2 BY RT PCR (HOSPITAL ORDER, PERFORMED IN ~~LOC~~ HOSPITAL LAB): SARS Coronavirus 2: NEGATIVE

## 2019-03-08 LAB — GLUCOSE, CAPILLARY: Glucose-Capillary: 202 mg/dL — ABNORMAL HIGH (ref 70–99)

## 2019-03-08 LAB — BRAIN NATRIURETIC PEPTIDE: B Natriuretic Peptide: 27 pg/mL (ref 0.0–100.0)

## 2019-03-08 LAB — LACTIC ACID, PLASMA: Lactic Acid, Venous: 1.5 mmol/L (ref 0.5–1.9)

## 2019-03-08 LAB — TROPONIN I (HIGH SENSITIVITY)
Troponin I (High Sensitivity): 10 ng/L (ref <18)
Troponin I (High Sensitivity): 12 ng/L

## 2019-03-08 LAB — MRSA PCR SCREENING: MRSA by PCR: NEGATIVE

## 2019-03-08 LAB — PROCALCITONIN: Procalcitonin: 0.31 ng/mL

## 2019-03-08 MED ORDER — IPRATROPIUM-ALBUTEROL 0.5-2.5 (3) MG/3ML IN SOLN: 3.0000 mL | Freq: Four times a day (QID) | RESPIRATORY_TRACT | Status: DC

## 2019-03-08 MED ORDER — LORAZEPAM 2 MG/ML IJ SOLN
0.5000 mg | Freq: Once | INTRAMUSCULAR | Status: AC
  Administered 2019-03-08: 10:00:00 0.5 mg via INTRAVENOUS
  Filled 2019-03-08: qty 1

## 2019-03-08 MED ORDER — FUROSEMIDE 10 MG/ML IJ SOLN
40.0000 mg | Freq: Once | INTRAMUSCULAR | Status: AC
  Administered 2019-03-08: 40 mg via INTRAVENOUS
  Filled 2019-03-08: qty 4

## 2019-03-08 MED ORDER — CHLORHEXIDINE GLUCONATE CLOTH 2 % EX PADS
6.0000 | MEDICATED_PAD | Freq: Every day | CUTANEOUS | Status: DC
  Administered 2019-03-08 – 2019-03-13 (×5): 6 via TOPICAL

## 2019-03-08 MED ORDER — FLUTICASONE PROPIONATE 50 MCG/ACT NA SUSP
1.0000 | Freq: Every day | NASAL | Status: DC
  Administered 2019-03-08 – 2019-03-14 (×5): 1 via NASAL
  Filled 2019-03-08: qty 16

## 2019-03-08 MED ORDER — MIRTAZAPINE 15 MG PO TABS
30.0000 mg | ORAL_TABLET | Freq: Every day | ORAL | Status: DC
  Administered 2019-03-08 – 2019-03-13 (×6): 30 mg via ORAL
  Filled 2019-03-08 (×6): qty 2

## 2019-03-08 MED ORDER — MORPHINE SULFATE (PF) 2 MG/ML IV SOLN
2.0000 mg | INTRAVENOUS | Status: DC | PRN
  Administered 2019-03-08 – 2019-03-09 (×2): 2 mg via INTRAVENOUS
  Administered 2019-03-09: 4 mg via INTRAVENOUS
  Administered 2019-03-10: 2 mg via INTRAVENOUS
  Filled 2019-03-08: qty 2
  Filled 2019-03-08 (×3): qty 1

## 2019-03-08 MED ORDER — METHYLPREDNISOLONE SODIUM SUCC 125 MG IJ SOLR
125.0000 mg | Freq: Once | INTRAMUSCULAR | Status: AC
  Administered 2019-03-08: 12:00:00 125 mg via INTRAVENOUS
  Filled 2019-03-08: qty 2

## 2019-03-08 MED ORDER — DOXYCYCLINE HYCLATE 100 MG PO TABS
100.0000 mg | ORAL_TABLET | Freq: Two times a day (BID) | ORAL | Status: DC
  Administered 2019-03-08: 16:00:00 100 mg
  Filled 2019-03-08: qty 1

## 2019-03-08 MED ORDER — FUROSEMIDE 10 MG/ML IJ SOLN
40.0000 mg | Freq: Every day | INTRAMUSCULAR | Status: DC
  Administered 2019-03-08: 17:00:00 40 mg via INTRAVENOUS
  Filled 2019-03-08: qty 4

## 2019-03-08 MED ORDER — ENOXAPARIN SODIUM 40 MG/0.4ML ~~LOC~~ SOLN
40.0000 mg | SUBCUTANEOUS | Status: DC
  Administered 2019-03-08 – 2019-03-13 (×6): 40 mg via SUBCUTANEOUS
  Filled 2019-03-08 (×6): qty 0.4

## 2019-03-08 MED ORDER — ASPIRIN 81 MG PO CHEW
81.0000 mg | CHEWABLE_TABLET | Freq: Every day | ORAL | Status: DC
  Administered 2019-03-08 – 2019-03-13 (×6): 81 mg via ORAL
  Filled 2019-03-08 (×6): qty 1

## 2019-03-08 MED ORDER — METHYLPREDNISOLONE SODIUM SUCC 125 MG IJ SOLR
60.0000 mg | Freq: Four times a day (QID) | INTRAMUSCULAR | Status: DC
  Administered 2019-03-08 – 2019-03-09 (×3): 60 mg via INTRAVENOUS
  Filled 2019-03-08 (×3): qty 2

## 2019-03-08 MED ORDER — SIMVASTATIN 20 MG PO TABS
40.0000 mg | ORAL_TABLET | Freq: Every evening | ORAL | Status: DC
  Administered 2019-03-08 – 2019-03-09 (×2): 40 mg via ORAL
  Filled 2019-03-08 (×2): qty 2

## 2019-03-08 MED ORDER — IPRATROPIUM-ALBUTEROL 0.5-2.5 (3) MG/3ML IN SOLN
3.0000 mL | Freq: Once | RESPIRATORY_TRACT | Status: AC
  Administered 2019-03-08: 12:00:00 3 mL via RESPIRATORY_TRACT
  Filled 2019-03-08: qty 3

## 2019-03-08 MED ORDER — FENTANYL 25 MCG/HR TD PT72
1.0000 | MEDICATED_PATCH | TRANSDERMAL | Status: DC
  Administered 2019-03-08: 16:00:00 1 via TRANSDERMAL
  Filled 2019-03-08: qty 1

## 2019-03-08 MED ORDER — FAMOTIDINE 20 MG PO TABS
20.0000 mg | ORAL_TABLET | Freq: Every day | ORAL | Status: DC
  Administered 2019-03-09 – 2019-03-14 (×6): 20 mg
  Filled 2019-03-08 (×6): qty 1

## 2019-03-08 MED ORDER — ACETAMINOPHEN 325 MG PO TABS
650.0000 mg | ORAL_TABLET | Freq: Four times a day (QID) | ORAL | Status: DC | PRN
  Administered 2019-03-08 – 2019-03-10 (×4): 650 mg
  Filled 2019-03-08 (×4): qty 2

## 2019-03-08 MED ORDER — POLYETHYLENE GLYCOL 3350 17 G PO PACK
17.0000 g | PACK | Freq: Every day | ORAL | Status: DC
  Administered 2019-03-08 – 2019-03-11 (×3): 17 g
  Filled 2019-03-08 (×3): qty 1

## 2019-03-08 MED ORDER — ONDANSETRON HCL 4 MG/2ML IJ SOLN
4.0000 mg | Freq: Four times a day (QID) | INTRAMUSCULAR | Status: DC | PRN
  Administered 2019-03-12 – 2019-03-13 (×2): 4 mg via INTRAVENOUS
  Filled 2019-03-08 (×2): qty 2

## 2019-03-08 MED ORDER — SODIUM CHLORIDE 0.9 % IV SOLN
500.0000 mg | INTRAVENOUS | Status: DC
  Administered 2019-03-08 – 2019-03-09 (×2): 500 mg via INTRAVENOUS
  Filled 2019-03-08 (×3): qty 500

## 2019-03-08 MED ORDER — CLONAZEPAM 1 MG PO TABS
1.0000 mg | ORAL_TABLET | Freq: Two times a day (BID) | ORAL | Status: DC
  Administered 2019-03-08 – 2019-03-10 (×5): 1 mg
  Filled 2019-03-08 (×5): qty 1

## 2019-03-08 MED ORDER — BUDESONIDE 0.25 MG/2ML IN SUSP: 0.2500 mg | Freq: Two times a day (BID) | RESPIRATORY_TRACT | Status: DC

## 2019-03-08 MED ORDER — ACETAMINOPHEN 650 MG RE SUPP: 650.0000 mg | Freq: Four times a day (QID) | RECTAL | Status: DC | PRN

## 2019-03-08 MED ORDER — PAROXETINE HCL 20 MG PO TABS
20.0000 mg | ORAL_TABLET | Freq: Every day | ORAL | Status: DC
  Administered 2019-03-08 – 2019-03-13 (×6): 20 mg
  Filled 2019-03-08 (×8): qty 1

## 2019-03-08 MED ORDER — IPRATROPIUM-ALBUTEROL 0.5-2.5 (3) MG/3ML IN SOLN
3.0000 mL | Freq: Four times a day (QID) | RESPIRATORY_TRACT | Status: DC
  Administered 2019-03-08 – 2019-03-09 (×3): 3 mL via RESPIRATORY_TRACT
  Filled 2019-03-08 (×3): qty 3

## 2019-03-08 MED ORDER — ARFORMOTEROL TARTRATE 15 MCG/2ML IN NEBU
15.0000 ug | INHALATION_SOLUTION | Freq: Two times a day (BID) | RESPIRATORY_TRACT | Status: DC
  Administered 2019-03-08 – 2019-03-09 (×2): 15 ug via RESPIRATORY_TRACT
  Filled 2019-03-08 (×4): qty 2

## 2019-03-08 MED ORDER — ONDANSETRON HCL 4 MG PO TABS: 4.0000 mg | ORAL_TABLET | Freq: Four times a day (QID) | ORAL | Status: DC | PRN

## 2019-03-08 MED ORDER — BUDESONIDE 0.25 MG/2ML IN SUSP
0.2500 mg | Freq: Two times a day (BID) | RESPIRATORY_TRACT | Status: DC
  Administered 2019-03-08 – 2019-03-09 (×2): 0.25 mg via RESPIRATORY_TRACT
  Filled 2019-03-08 (×2): qty 2

## 2019-03-08 MED ORDER — SENNA 8.6 MG PO TABS
1.0000 | ORAL_TABLET | Freq: Every day | ORAL | Status: DC
  Filled 2019-03-08: qty 1

## 2019-03-08 MED ORDER — CLONAZEPAM 1 MG PO TABS: 1.0000 mg | ORAL_TABLET | Freq: Two times a day (BID) | ORAL | Status: DC

## 2019-03-08 MED ORDER — QUETIAPINE FUMARATE 25 MG PO TABS
25.0000 mg | ORAL_TABLET | Freq: Every day | ORAL | Status: DC
  Administered 2019-03-08 – 2019-03-09 (×2): 25 mg
  Filled 2019-03-08 (×2): qty 1

## 2019-03-08 MED ORDER — LABETALOL HCL 5 MG/ML IV SOLN
10.0000 mg | Freq: Four times a day (QID) | INTRAVENOUS | Status: DC | PRN
  Administered 2019-03-08 – 2019-03-10 (×3): 10 mg via INTRAVENOUS
  Filled 2019-03-08 (×3): qty 4

## 2019-03-08 MED ORDER — LORATADINE 10 MG PO TABS
10.0000 mg | ORAL_TABLET | Freq: Every day | ORAL | Status: DC
  Administered 2019-03-08 – 2019-03-14 (×7): 10 mg via ORAL
  Filled 2019-03-08 (×7): qty 1

## 2019-03-08 NOTE — Progress Notes (Signed)
Family Meeting Note  Advance Directive:yes  Today a meeting took place with the Patient and daughter.  Patient is able to participate.  The following clinical team members were present during this meeting:MD  The following were discussed:Patient's diagnosis: COPD exacerbation, Patient's progosis: Unable to determine and Goals for treatment: DNR  Additional follow-up to be provided: prn  Time spent during discussion:20 minutes  Evette Doffing, MD

## 2019-03-08 NOTE — Consult Note (Signed)
CRITICAL CARE NOTE      CHIEF COMPLAINT:   Acute hypoxemic respiratory failure   HISTORY OF PRESENT ILLNESS   This is a pleasant 58 year old female with a history of anxiety, asthma, CHF, cocaine abuse, COPD, CAD, emphysema, hep C, dyslipidemia, essential hypertension, lifelong smoking, status post tracheostomy, came into the emergency room with worsening shortness of breath over the last 2 weeks.  Patient was apparently having labored breathing and felt chest discomfort as well as worsening shortness of breath.  She was tested for COVID which was negative, she did have mild leukocytosis.  In the ER she was placed on BiPAP and had breathing treatments which were not helpful and eventually placed on mechanical ventilation.  She was admitted to hospitalist service.  Critical care consultation was placed for acute hypoxemic respiratory failure likely due to severe acute exacerbation of COPD  PAST MEDICAL HISTORY   Past Medical History:  Diagnosis Date  . Allergy   . Anxiety   . Asthma   . CHF (congestive heart failure) ([place])   . Cocaine abuse ([place])   . COPD (chronic obstructive pulmonary disease) ([place])   . Coronary artery disease   . Emphysema/COPD ([place])   . Hepatitis C   . High cholesterol   . Hypertension   . Pneumothorax   . Tobacco abuse      SURGICAL HISTORY   Past Surgical History:  Procedure Laterality Date  . CARDIAC CATHETERIZATION    . CHEST TUBE INSERTION    . IR GASTROSTOMY TUBE MOD SED  10/31/2017  . TRACHEOSTOMY TUBE PLACEMENT N/A 10/17/2017   Procedure: TRACHEOSTOMY;  Surgeon: Carloyn Manner, MD;  Location: [facility] ORS;  Service: ENT;  Laterality: N/A;     FAMILY HISTORY   Family History  Problem Relation Age of Onset  . Lung cancer Mother   . Asthma Mother   . CVA Father   . CAD  Father   . Stroke Father   . Breast cancer Maternal Aunt 67  . COPD Sister      SOCIAL HISTORY   Social History   Tobacco Use  . Smoking status: Former Smoker    Packs/day: 0.10    Years: 41.00    Pack years: 4.10    Types: Cigarettes    Quit date: 06/09/2017    Years since quitting: 1.7  . Smokeless tobacco: Never Used  Substance Use Topics  . Alcohol use: No    Frequency: Never  . Drug use: Yes    Types: "Crack" cocaine    Comment: Past cocaine use.  Quit 04/2017     MEDICATIONS   Current Medication:  Current Facility-Administered Medications:  .  acetaminophen (TYLENOL) tablet 650 mg, 650 mg, Per Tube, Q6H PRN **OR** acetaminophen (TYLENOL) suppository 650 mg, 650 mg, Rectal, Q6H PRN, Mayo, Pete Pelt, MD .  arformoterol (BROVANA) nebulizer solution 15 mcg, 15 mcg, Nebulization, Q12H, Mayo, Pete Pelt, MD .  aspirin chewable tablet 81 mg, 81 mg, Oral, QHS, Mayo, Pete Pelt, MD .  budesonide (PULMICORT) nebulizer solution 0.25 mg, 0.25 mg, Nebulization, BID, Mayo, Pete Pelt, MD .  Chlorhexidine Gluconate Cloth 2 % PADS 6 each, 6 each, Topical, Daily, Ottie Glazier, MD, 6 each at 03/08/19 1520 .  clonazePAM (KLONOPIN) tablet 1 mg, 1 mg, Per Tube, BID, Samaa Ueda, MD, 1 mg at 03/08/19 1547 .  doxycycline (VIBRA-TABS) tablet 100 mg, 100 mg, Per Tube, Q12H, Mayo, Pete Pelt, MD, 100 mg at 03/08/19 1545 .  enoxaparin (LOVENOX) injection  40 mg, 40 mg, Subcutaneous, Q24H, Mayo, Pete Pelt, MD .  Derrill Memo ON 03/09/2019] famotidine (PEPCID) tablet 20 mg, 20 mg, Per Tube, Daily, Mayo, Pete Pelt, MD .  fentaNYL (DURAGESIC) 25 MCG/HR 1 patch, 1 patch, Transdermal, Q72H, Mayo, Pete Pelt, MD, 1 patch at 03/08/19 1545 .  fluticasone (FLONASE) 50 MCG/ACT nasal spray 1 spray, 1 spray, Each Nare, Daily, Mayo, Pete Pelt, MD .  ipratropium-albuterol (DUONEB) 0.5-2.5 (3) MG/3ML nebulizer solution 3 mL, 3 mL, Nebulization, Q6H, Mayo, Pete Pelt, MD .  labetalol (NORMODYNE) injection 10 mg, 10  mg, Intravenous, Q6H PRN, Mayo, Pete Pelt, MD, 10 mg at 03/08/19 1410 .  loratadine (CLARITIN) tablet 10 mg, 10 mg, Oral, Daily, Mayo, Pete Pelt, MD, 10 mg at 03/08/19 1545 .  methylPREDNISolone sodium succinate (SOLU-MEDROL) 125 mg/2 mL injection 60 mg, 60 mg, Intravenous, Q6H, Mayo, Pete Pelt, MD .  mirtazapine (REMERON) tablet 30 mg, 30 mg, Oral, QHS, Mayo, Pete Pelt, MD .  ondansetron [facility]) tablet 4 mg, 4 mg, Oral, Q6H PRN **OR** ondansetron (ZOFRAN) injection 4 mg, 4 mg, Intravenous, Q6H PRN, Mayo, Pete Pelt, MD .  PARoxetine (PAXIL) tablet 20 mg, 20 mg, Per Tube, QHS, Mayo, Pete Pelt, MD .  polyethylene glycol (MIRALAX / GLYCOLAX) packet 17 g, 17 g, Per Tube, Daily, Mayo, Pete Pelt, MD, 17 g at 03/08/19 1545 .  QUEtiapine (SEROQUEL) tablet 25 mg, 25 mg, Per Tube, QHS, Mayo, Pete Pelt, MD .  senna (SENOKOT) tablet 8.6-17.2 mg, 1-2 tablet, Per Tube, QHS, Mayo, Pete Pelt, MD .  simvastatin (ZOCOR) tablet 40 mg, 40 mg, Oral, QPM, Mayo, Pete Pelt, MD    ALLERGIES   Amoxicillin    REVIEW OF SYSTEMS     10 point ROS negative except as per   PHYSICAL EXAMINATION   Vitals:   03/08/19 1430 03/08/19 1512  BP: (!) 142/89 (!) 169/101  Pulse: 97   Resp: (!) 23 12  Temp:  99.4 F (37.4 C)  SpO2: 100% 100%    GENERAL:mild distress due to respiratory distress HEAD: Normocephalic, atraumatic.  EYES: Pupils equal, round, reactive to light.  No scleral icterus.  MOUTH: Moist mucosal membrane. NECK: Supple. No thyromegaly. No nodules. No JVD.  PULMONARY: bilateral wheezing and coarse lung sounds CARDIOVASCULAR: S1 and S2. Regular rate and rhythm. No murmurs, rubs, or gallops.  GASTROINTESTINAL: Soft, nontender, non-distended. No masses. Positive bowel sounds. No hepatosplenomegaly.  MUSCULOSKELETAL: No swelling, clubbing, or edema.  NEUROLOGIC: Mild distress due to acute illness SKIN:intact,warm,dry   PERTINENT DATA      Lines / Drains: PIV  Cultures / Sepsis markers:  Blood Respiratory  RVP COVID is negative   Antibiotics: Doxy>zithromax   Protocols / Consultants: hospitalist Critical care  Tests / Events: CXR     Infusions:  Scheduled Medications: . arformoterol  15 mcg Nebulization Q12H  . aspirin  81 mg Oral QHS  . budesonide  0.25 mg Nebulization BID  . Chlorhexidine Gluconate Cloth  6 each Topical Daily  . clonazePAM  1 mg Per Tube BID  . doxycycline  100 mg Per Tube Q12H  . enoxaparin (LOVENOX) injection  40 mg Subcutaneous Q24H  . [START ON 03/09/2019] famotidine  20 mg Per Tube Daily  . fentaNYL  1 patch Transdermal Q72H  . fluticasone  1 spray Each Nare Daily  . ipratropium-albuterol  3 mL Nebulization Q6H  . loratadine  10 mg Oral Daily  . methylPREDNISolone (SOLU-MEDROL) injection  60 mg Intravenous Q6H  . mirtazapine  30  mg Oral QHS  . PARoxetine  20 mg Per Tube QHS  . polyethylene glycol  17 g Per Tube Daily  . QUEtiapine  25 mg Per Tube QHS  . senna  1-2 tablet Per Tube QHS  . simvastatin  40 mg Oral QPM   PRN Medications: acetaminophen **OR** acetaminophen, labetalol, ondansetron **OR** ondansetron (ZOFRAN) IV Hemodynamic parameters:   Intake/Output: No intake/output data recorded.  Ventilator  Settings: Vent Mode: AC FiO2 (%):  [50 %] 50 % Set Rate:  [14 bmp] 14 bmp Vt Set:  [450 mL] 450 mL PEEP:  [5 cmH20] 5 cmH20    LAB RESULTS:  Basic Metabolic Panel: Recent Labs  Lab 03/08/19 1009  NA 139  K 4.2  CL 106  CO2 22  GLUCOSE 176*  BUN 8  CREATININE 0.66  CALCIUM 9.1   Liver Function Tests: Recent Labs  Lab 03/08/19 1009  AST 31  ALT 21  ALKPHOS 122  BILITOT 0.7  PROT 8.0  ALBUMIN 4.1   No results for input(s): LIPASE, AMYLASE in the last 168 hours. No results for input(s): AMMONIA in the last 168 hours. CBC: Recent Labs  Lab 03/08/19 1009  WBC 13.0*  NEUTROABS 10.6*  HGB 10.5*  HCT 36.0  MCV 80.7  PLT 279   Cardiac Enzymes: No results for input(s): CKTOTAL, CKMB,  CKMBINDEX, TROPONINI in the last 168 hours. BNP: Invalid input(s): POCBNP CBG: Recent Labs  Lab 03/08/19 1517  GLUCAP 202*     IMAGING RESULTS:  Imaging: Dg Chest Portable 1 View  Result Date: 03/08/2019 CLINICAL DATA:  Shortness of breath EXAM: PORTABLE CHEST 1 VIEW COMPARISON:  10/22/2018 FINDINGS: Tracheostomy tube in satisfactory position. The heart size and mediastinal contours are within normal limits. Both lungs are clear. There is mild bibasilar atelectasis. The visualized skeletal structures are unremarkable. IMPRESSION: No active disease. Electronically Signed   By: Kathreen Devoid   On: 03/08/2019 11:08      ASSESSMENT AND PLAN    -Multidisciplinary rounds held today  Acute on chronic Hypoxic Respiratory Failure -due to severe COPD exacerbation with bibasilar atelectasis as evidenced by CXR above - Solumedrol 60mg  q8h -Zithromax 500 iv q daily - chest PT -MetaNEB  With saline via Trache q4h -continue Bronchodilator Therapy -Wean Fio2 goal SpO2 - 88-92% -mild interstitial prominence suggestive of edema, pt with 1+ LE edema - will gently diurese today -RVP is ordered please perform it please   Anxiety disorder   - contributing to labored breathing  - pt requests additional dose of Klonopin -follow up cardiac enzymes as indicated ICU monitoring   ID -continue IV abx as prescibed -follow up cultures  GI/Nutrition GI PROPHYLAXIS as indicated DIET-->TF's as tolerated Constipation protocol as indicated   ENDO - ICU hypoglycemic\Hyperglycemia protocol -check FSBS per protocol   ELECTROLYTES -follow labs as needed -replace as needed -pharmacy consultation   DVT/GI PRX ordered -SCDs  TRANSFUSIONS AS NEEDED MONITOR FSBS ASSESS the need for LABS as needed   Critical care provider statement:    Critical care time (minutes):  32   Critical care time was exclusive of:  Separately billable procedures and treating other patients   Critical care was  necessary to treat or prevent imminent or life-threatening deterioration of the following conditions:   Acute hypoxemic respiratory failure, acute exacerbation of COPD, bibasilar atelectasis, anxiety disorder, tracheostomy status, multiple comorbid conditions   Critical care was time spent personally by me on the following activities:  Development of treatment plan with patient  or surrogate, discussions with consultants, evaluation of patient's response to treatment, examination of patient, obtaining history from patient or surrogate, ordering and performing treatments and interventions, ordering and review of laboratory studies and re-evaluation of patient's condition.  I assumed direction of critical care for this patient from another provider in my specialty: no    This document was prepared using Dragon voice recognition software and may include unintentional dictation errors.    Ottie Glazier, M.D.  Division of [facility]

## 2019-03-08 NOTE — ED Notes (Signed)
.. ED TO INPATIENT HANDOFF REPORT  ED Nurse Name and Phone #: Deneise Lever [phone]  S Name/Age/Gender Michelle [name] 58 y.o. female Room/Bed: ED10A/ED10A  Code Status   Code Status: Prior  Home/SNF/Other Home Patient oriented to: self, place, time and situation Is this baseline? Yes   Triage Complete: Triage complete  Chief Complaint sob  Triage Note Patient Arrived via [facility]. Complaint of SOB worsened overnight. Last breathing treatment was at 0700. No fever. BP 160/100 with history of HTN, HR 116. SpO2 97% on 4 L. Increased secretions. Patient has obvious increased labor of breathing   Allergies Allergies  Allergen Reactions  . Amoxicillin     Level of Care/Admitting Diagnosis ED Disposition    ED Disposition Condition Comment   Admit  The patient appears reasonably stabilized for admission considering the current resources, flow, and capabilities available in the ED at this time, and I doubt any other [facility] requiring further screening and/or treatment in the ED prior to admission is  present.       B Medical/Surgery History Past Medical History:  Diagnosis Date  . Allergy   . Anxiety   . Asthma   . CHF (congestive heart failure) ([place])   . Cocaine abuse ([place])   . COPD (chronic obstructive pulmonary disease) ([place])   . Coronary artery disease   . Emphysema/COPD ([place])   . Hepatitis C   . High cholesterol   . Hypertension   . Pneumothorax   . Tobacco abuse    Past Surgical History:  Procedure Laterality Date  . CARDIAC CATHETERIZATION    . CHEST TUBE INSERTION    . IR GASTROSTOMY TUBE MOD SED  10/31/2017  . TRACHEOSTOMY TUBE PLACEMENT N/A 10/17/2017   Procedure: TRACHEOSTOMY;  Surgeon: Carloyn Manner, MD;  Location: [facility] ORS;  Service: ENT;  Laterality: N/A;     A IV Location/Drains/Wounds Patient Lines/Drains/Airways Status   Active Line/Drains/Airways    Name:   Placement date:   Placement time:   Site:   Days:   Gastrostomy/Enterostomy Gastrostomy  20 Fr. LUQ   10/31/17    1448    LUQ   493   Tracheostomy [place] 6 mm Cuffed   09/17/18    1216    6 mm   172          Intake/Output Last 24 hours No intake or output data in the 24 hours ending 03/08/19 1239  Labs/Imaging Results for orders placed or performed during the hospital encounter of 03/08/19 (from the past 48 hour(s))  CBC with Differential     Status: Abnormal   Collection Time: 03/08/19 10:09 AM  Result Value Ref Range   WBC 13.0 (H) 4.0 - 10.5 K/uL   RBC 4.46 3.87 - 5.11 MIL/uL   Hemoglobin 10.5 (L) 12.0 - 15.0 g/dL   HCT 36.0 36.0 - 46.0 %   MCV 80.7 80.0 - 100.0 fL   MCH 23.5 (L) 26.0 - 34.0 pg   MCHC 29.2 (L) 30.0 - 36.0 g/dL   RDW 15.2 11.5 - 15.5 %   Platelets 279 150 - 400 K/uL   nRBC 0.0 0.0 - 0.2 %   Neutrophils Relative % 82 %   Neutro Abs 10.6 (H) 1.7 - 7.7 K/uL   Lymphocytes Relative 9 %   Lymphs Abs 1.2 0.7 - 4.0 K/uL   Monocytes Relative 8 %   Monocytes Absolute 1.1 (H) 0.1 - 1.0 K/uL   Eosinophils Relative 1 %   Eosinophils Absolute 0.2  0.0 - 0.5 K/uL   Basophils Relative 0 %   Basophils Absolute 0.0 0.0 - 0.1 K/uL   Immature Granulocytes 0 %   Abs Immature Granulocytes 0.04 0.00 - 0.07 K/uL    Comment: Performed at [facility], [address] [place], [place] [address]  Comprehensive metabolic panel     Status: Abnormal   Collection Time: 03/08/19 10:09 AM  Result Value Ref Range   Sodium 139 135 - 145 mmol/L   Potassium 4.2 3.5 - 5.1 mmol/L    Comment: HEMOLYSIS AT THIS LEVEL MAY AFFECT RESULT   Chloride 106 98 - 111 mmol/L   CO2 22 22 - 32 mmol/L   Glucose, Bld 176 (H) 70 - 99 mg/dL   BUN 8 6 - 20 mg/dL   Creatinine, Ser 0.66 0.44 - 1.00 mg/dL   Calcium 9.1 8.9 - 10.3 mg/dL   Total Protein 8.0 6.5 - 8.1 g/dL   Albumin 4.1 3.5 - 5.0 g/dL   AST 31 15 - 41 U/L   ALT 21 0 - 44 U/L   Alkaline Phosphatase 122 38 - 126 U/L   Total Bilirubin 0.7 0.3 - 1.2 mg/dL   GFR calc non Af Amer >60 >60 mL/min   GFR calc Af Amer >60 >60  mL/min   Anion gap 11 5 - 15    Comment: Performed at [facility], [place], [place] [address]  Troponin I (High Sensitivity)     Status: None   Collection Time: 03/08/19 10:09 AM  Result Value Ref Range   Troponin I (High Sensitivity) 10 <18 ng/L    Comment: (NOTE) Elevated high sensitivity troponin I (hsTnI) values and significant  changes across serial measurements may suggest ACS but many other  chronic and acute conditions are known to elevate hsTnI results.  Refer to the "Links" section for chest pain algorithms and additional  guidance. Performed at [facility], [address] [place], [place] [address]   Brain natriuretic peptide     Status: None   Collection Time: 03/08/19 10:09 AM  Result Value Ref Range   B Natriuretic Peptide 27.0 0.0 - 100.0 pg/mL    Comment: Performed at [facility], [address] [place], [place] [address]  SARS Coronavirus 2 [facility] order, Performed in [facility] hospital lab) Nasopharyngeal Nasopharyngeal Swab     Status: None   Collection Time: 03/08/19 10:09 AM   Specimen: Nasopharyngeal Swab  Result Value Ref Range   SARS Coronavirus 2 NEGATIVE NEGATIVE    Comment: (NOTE) If result is NEGATIVE SARS-CoV-2 target nucleic acids are NOT DETECTED. The SARS-CoV-2 RNA is generally detectable in upper and lower  respiratory specimens during the acute phase of infection. The lowest  concentration of SARS-CoV-2 viral copies this assay can detect is 250  copies / mL. A negative result does not preclude SARS-CoV-2 infection  and should not be used as the sole basis for treatment or other  patient management decisions.  A negative result may occur with  improper specimen collection / handling, submission of specimen other  than nasopharyngeal swab, presence of viral mutation(s) within the  areas targeted by this assay, and inadequate number of viral copies  (<250 copies / mL). A negative result must be  combined with clinical  observations, patient history, and epidemiological information. If result is POSITIVE SARS-CoV-2 target nucleic acids are DETECTED. The SARS-CoV-2 RNA is generally detectable in upper and lower  respiratory specimens dur ing the acute  phase of infection.  Positive  results are indicative of active infection with SARS-CoV-2.  Clinical  correlation with patient history and other diagnostic information is  necessary to determine patient infection status.  Positive results do  not rule out bacterial infection or co-infection with other viruses. If result is PRESUMPTIVE POSTIVE SARS-CoV-2 nucleic acids MAY BE PRESENT.   A presumptive positive result was obtained on the submitted specimen  and confirmed on repeat testing.  While 2019 novel coronavirus  (SARS-CoV-2) nucleic acids may be present in the submitted sample  additional confirmatory testing may be necessary for epidemiological  and / or clinical management purposes  to differentiate between  SARS-CoV-2 and other Sarbecovirus currently known to infect humans.  If clinically indicated additional testing with an alternate test  methodology [phone]) is advised. The SARS-CoV-2 RNA is generally  detectable in upper and lower respiratory sp ecimens during the acute  phase of infection. The expected result is Negative. Fact Sheet for Patients:  StrictlyIdeas.no Fact Sheet for Healthcare Providers: BankingDealers.co.za This test is not yet approved or cleared by the [place] FDA and has been authorized for detection and/or diagnosis of SARS-CoV-2 by FDA under an Emergency Use Authorization (EUA).  This EUA will remain in effect (meaning this test can be used) for the duration of the COVID-19 declaration under Section 564(b)(1) of the Act, 21 U.S.C. section 360bbb-3(b)(1), unless the authorization is terminated or revoked sooner. Performed at [facility], [address] [place], [place] [address]   Blood gas, venous     Status: Abnormal   Collection Time: 03/08/19 10:22 AM  Result Value Ref Range   pH, Ven 7.28 7.250 - 7.430   pCO2, Ven 56 44.0 - 60.0 mmHg   pO2, Ven 69.0 (H) 32.0 - 45.0 mmHg   Bicarbonate 26.3 20.0 - 28.0 mmol/L   Acid-base deficit 1.4 0.0 - 2.0 mmol/L   O2 Saturation 91.0 %   Patient temperature 37.0    Collection site VEIN    Sample type VEIN     Comment: Performed at [facility], [address] [place], [place] [address]   Dg Chest Portable 1 View  Result Date: 03/08/2019 CLINICAL DATA:  Shortness of breath EXAM: PORTABLE CHEST 1 VIEW COMPARISON:  10/22/2018 FINDINGS: Tracheostomy tube in satisfactory position. The heart size and mediastinal contours are within normal limits. Both lungs are clear. There is mild bibasilar atelectasis. The visualized skeletal structures are unremarkable. IMPRESSION: No active disease. Electronically Signed   By: Kathreen Devoid   On: 03/08/2019 11:08    Pending Labs Unresulted Labs (From admission, onward)    Start     Ordered   03/08/19 1016  Culture, respiratory (non-expectorated)  Once,   STAT     03/08/19 1016          Vitals/Pain Today's Vitals   03/08/19 1122 03/08/19 1200 03/08/19 1215 03/08/19 1217  BP: (!) 196/97 (!) 160/109    Pulse: (!) 131  (!) 130   Resp: (!) 22 20 (!) 22   Temp:      TempSrc:      SpO2: 100%  100% 100%  Weight:      Height:        Isolation Precautions No active isolations  Medications Medications  LORazepam (ATIVAN) injection 0.5 mg (0.5 mg Intravenous Given 03/08/19 1024)  ipratropium-albuterol (DUONEB) 0.5-2.5 (3) MG/3ML nebulizer solution 3 mL (3 mLs Nebulization Given 03/08/19 1217)  ipratropium-albuterol (DUONEB) 0.5-2.5 (3) MG/3ML nebulizer solution 3  mL (3 mLs Nebulization Given 03/08/19 1217)  methylPREDNISolone sodium succinate (SOLU-MEDROL) 125 mg/2 mL injection 125 mg (125 mg Intravenous Given 03/08/19 1217)     Mobility non-ambulatory Moderate fall risk   Focused Assessments Pulmonary Assessment Handoff:  Lung sounds: Bilateral Breath Sounds: Expiratory wheezes R Breath Sounds: Expiratory wheezes O2 Device: Ventilator O2 Flow Rate (L/min): 4 L/min      R Recommendations: See Admitting Provider Note  Report given to:   Additional Notes:

## 2019-03-08 NOTE — ED Notes (Signed)
This RN attempted to call report.  

## 2019-03-08 NOTE — ED Triage Notes (Signed)
Patient Arrived via [facility]. Complaint of SOB worsened overnight. Last breathing treatment was at 0700. No fever. BP 160/100 with history of HTN, HR 116. SpO2 97% on 4 L. Increased secretions. Patient has obvious increased labor of breathing

## 2019-03-08 NOTE — H&P (Addendum)
[place] [facility] [place] NAME: Michelle [name]    MR#:  [id number]  DATE OF BIRTH:  1960/12/19  DATE OF ADMISSION:  03/08/2019  PRIMARY CARE PHYSICIAN: Frazier Richards, MD   REQUESTING/REFERRING PHYSICIAN: Harvest Dark, MD  CHIEF COMPLAINT:   Chief Complaint  Patient presents with  . Shortness of Breath    HISTORY OF PRESENT ILLNESS:  Michelle [name]  is a 58 y.o. female with a known history of chronic respiratory failure due to COPD with trach in place, CAD, hypertension, hyperlipidemia, congestive heart failure, anxiety who presented to the ED with progressively worsening shortness of breath over the last 2 weeks.  Patient is also had cough productive of clear sputum over the last 2 weeks.  She has been getting short of breath, but will then use a breathing treatment and her shortness of breath will resolve.  Her shortness of breath acutely worsened this morning, and did not improve with breathing treatments at home, so she came to the ED for further evaluation.  She denies any fevers or chills.  She denies any lower extremity edema or orthopnea.  No chest pain.  In the ED, she was meeting sepsis criteria with tachycardia, tachypnea, and leukocytosis. She was also hypertensive. Labs were significant for glucose 176, WBC 13, hemoglobin 10.5. COVID was negative. CXR was unremarkable. She was placed on BiPAP and give duonebs and solumedrol. Hospitalists were called for admission.  PAST MEDICAL HISTORY:   Past Medical History:  Diagnosis Date  . Allergy   . Anxiety   . Asthma   . CHF (congestive heart failure) ([place])   . Cocaine abuse ([place])   . COPD (chronic obstructive pulmonary disease) ([place])   . Coronary artery disease   . Emphysema/COPD ([place])   . Hepatitis C   . High cholesterol   . Hypertension   . Pneumothorax   . Tobacco abuse     PAST SURGICAL HISTORY:   Past Surgical History:  Procedure Laterality Date  . CARDIAC  CATHETERIZATION    . CHEST TUBE INSERTION    . IR GASTROSTOMY TUBE MOD SED  10/31/2017  . TRACHEOSTOMY TUBE PLACEMENT N/A 10/17/2017   Procedure: TRACHEOSTOMY;  Surgeon: Carloyn Manner, MD;  Location: [facility] ORS;  Service: ENT;  Laterality: N/A;    SOCIAL HISTORY:   Social History   Tobacco Use  . Smoking status: Former Smoker    Packs/day: 0.10    Years: 41.00    Pack years: 4.10    Types: Cigarettes    Quit date: 06/09/2017    Years since quitting: 1.7  . Smokeless tobacco: Never Used  Substance Use Topics  . Alcohol use: No    Frequency: Never    FAMILY HISTORY:   Family History  Problem Relation Age of Onset  . Lung cancer Mother   . Asthma Mother   . CVA Father   . CAD Father   . Stroke Father   . Breast cancer Maternal Aunt 67  . COPD Sister     DRUG ALLERGIES:   Allergies  Allergen Reactions  . Amoxicillin     REVIEW OF SYSTEMS:   Review of Systems  Constitutional: Negative for chills and fever.  HENT: Negative for congestion and sore throat.   Eyes: Negative for blurred vision and double vision.  Respiratory: Positive for cough, sputum production, shortness of breath and wheezing.   Cardiovascular: Negative for chest pain, palpitations, orthopnea and leg swelling.  Gastrointestinal: Negative  for nausea and vomiting.  Genitourinary: Negative for dysuria and urgency.  Musculoskeletal: Negative for back pain and neck pain.  Neurological: Negative for dizziness and headaches.  Psychiatric/Behavioral: Negative for depression. The patient is not nervous/anxious.     MEDICATIONS AT HOME:   Prior to Admission medications   Medication Sig Start Date End Date Taking? Authorizing Provider  AMBULATORY NON FORMULARY MEDICATION Medication Name: Pulse oximeter DME:[facility] DX J44.9....[id number] 01/08/18   Flora Lipps, MD  aspirin 81 MG chewable tablet Chew 81 mg by mouth at bedtime.     [provider]  budesonide (PULMICORT) 0.25 MG/2ML nebulizer solution  Take 2 mLs (0.25 mg total) by nebulization 2 (two) times daily. 12/31/17   Conforti, John, DO  cetirizine (ZYRTEC) 10 MG tablet Place 10 mg into feeding tube daily.     [provider]  clonazePAM (KLONOPIN) 0.5 MG tablet Place 1 tablet (0.5 mg total) into feeding tube daily. Patient taking differently: Place 1 mg into feeding tube 2 (two) times daily. (may also take as needed for anxiety) 01/07/18   Flora Lipps, MD  famotidine (PEPCID) 20 MG tablet Place 1 tablet (20 mg total) into feeding tube daily. 12/31/17   Conforti, John, DO  fentaNYL (DURAGESIC - DOSED MCG/HR) 25 MCG/HR patch Place 1 patch (25 mcg total) onto the skin every 3 (three) days. 01/07/18   Flora Lipps, MD  fluticasone (FLONASE) 50 MCG/ACT nasal spray Place 1 spray into both nostrils daily.     [provider]  formoterol (PERFOROMIST) 20 MCG/2ML nebulizer solution Take 2 mLs (20 mcg total) by nebulization 2 (two) times daily. 12/08/18   Flora Lipps, MD  ipratropium-albuterol (DUONEB) 0.5-2.5 (3) MG/3ML SOLN Take 3 mLs by nebulization every 6 (six) hours. Patient taking differently: Take 3 mLs by nebulization every 6 (six) hours as needed (for shortness of breath).  12/31/17   Conforti, John, DO  mirtazapine (REMERON) 30 MG tablet Take 30 mg by mouth at bedtime.    [provider]  oxyCODONE-acetaminophen (PERCOCET/ROXICET) 5-325 MG tablet Place 1 tablet into feeding tube every 8 (eight) hours as needed for severe pain. 01/07/18   Flora Lipps, MD  PARoxetine (PAXIL) 20 MG tablet Place 1 tablet (20 mg total) into feeding tube at bedtime. 12/31/17   Conforti, John, DO  polyethylene glycol (MIRALAX / GLYCOLAX) 17 g packet Place 17 g into feeding tube daily.    [provider]  predniSONE (DELTASONE) 20 MG tablet Take 2 tablets (40 mg total) by mouth daily with breakfast. Patient not taking: Reported on 12/08/2018 10/23/18   Awilda Bill, NP  predniSONE (DELTASONE) 20 MG tablet Take 2 tablets (40 mg total)  by mouth daily with breakfast. 10 days 12/08/18   Flora Lipps, MD  QUEtiapine (SEROQUEL) 25 MG tablet Place 1 tablet (25 mg total) into feeding tube at bedtime. 12/31/17   Conforti, John, DO  senna (SENOKOT) 8.6 MG TABS tablet Place 1-2 tablets into feeding tube at bedtime.     [provider]  simvastatin (ZOCOR) 20 MG tablet Take 40 mg by mouth every evening.     [provider]      VITAL SIGNS:  Blood pressure (!) 160/109, pulse (!) 130, temperature 99.1 F (37.3 C), temperature source Oral, resp. rate (!) 22, height 5\' 1"  (1.549 m), weight 74.4 kg, last menstrual period 03/06/2005, SpO2 100 %.  PHYSICAL EXAMINATION:  Physical Exam  GENERAL:  58 y.o.-year-old patient lying in the bed with no acute distress.  EYES:  Pupils equal, round, reactive to light and accommodation. No scleral icterus. Extraocular muscles intact.  HEENT: Head atraumatic, normocephalic. Oropharynx and nasopharynx clear. +trach NECK:  Supple, no jugular venous distention. No thyroid enlargement, no tenderness.  LUNGS: + Increased respiratory rate, diffuse expiratory wheezing present, diminished breath sounds throughout all lung fields CARDIOVASCULAR: tachycardic, regular rhythm, S1, S2 normal. No murmurs, rubs, or gallops.  ABDOMEN: Soft, nontender, nondistended. Bowel sounds present. No organomegaly or mass.  EXTREMITIES: No pedal edema, cyanosis, or clubbing.  NEUROLOGIC: Cranial nerves II through XII are intact. Muscle strength 5/5 in all extremities. Sensation intact. Gait not checked.  PSYCHIATRIC: The patient is alert and oriented x 3.  SKIN: No obvious rash, lesion, or ulcer.   LABORATORY PANEL:   CBC Recent Labs  Lab 03/08/19 1009  WBC 13.0*  HGB 10.5*  HCT 36.0  PLT 279   ------------------------------------------------------------------------------------------------------------------  Chemistries  Recent Labs  Lab 03/08/19 1009  NA 139  K 4.2  CL 106  CO2 22  GLUCOSE  176*  BUN 8  CREATININE 0.66  CALCIUM 9.1  AST 31  ALT 21  ALKPHOS 122  BILITOT 0.7   ------------------------------------------------------------------------------------------------------------------  Cardiac Enzymes No results for input(s): TROPONINI in the last 168 hours. ------------------------------------------------------------------------------------------------------------------  RADIOLOGY:  Dg Chest Portable 1 View  Result Date: 03/08/2019 CLINICAL DATA:  Shortness of breath EXAM: PORTABLE CHEST 1 VIEW COMPARISON:  10/22/2018 FINDINGS: Tracheostomy tube in satisfactory position. The heart size and mediastinal contours are within normal limits. Both lungs are clear. There is mild bibasilar atelectasis. The visualized skeletal structures are unremarkable. IMPRESSION: No active disease. Electronically Signed   By: Kathreen Devoid   On: 03/08/2019 11:08      IMPRESSION AND PLAN:   Acute on chronic hypoxic respiratory failure secondary to COPD exacerbation- patient is on 3 L trach collar at baseline, also with PEG.  Chest x-ray is negative. She is actively wheezing on exam. -Vent management per CCM -COVID test is negative -Continue IV Solu-Medrol -DuoNebs scheduled -Continue home inhalers -Start doxycycline -Check procalcitonin and RVP -Sputum culture ordered -Wean off vent as able -If no improvement, could consider CTA chest, given her tachycardia on admission  Sepsis- meeting sepsis criteria on admission with leukocytosis, tachycardia, and tachypnea.  May just be due to COPD exacerbation.  Chest x-ray without pneumonia. -Check UA and urine culture -Sputum culture and procalcitonin ordered -Check blood cultures and lactic acid -Doxycycline for COPD exacerbation  Hyperglycemia- no diagnosis of diabetes -Check A1c  Hypertension- BP markedly elevated in the ED -May need to start daily BP med -IV Labetalol prn  Chronic diastolic CHF- no signs of volume overload. BNP  normal. -Monitor  CAD- stable, no active chest pain. Initial troponin negative. -Continue home aspirin  Chronic pain syndrome-stable -Continue home fentanyl patch  Hyperlipidemia- stable -Continue home Zocor  Chronic anxiety with panic disorder-stable -Continue home Remeron, Paxil, Seroquel, Klonopin  Former smoker- quit 1 year ago.  All the records are reviewed and case discussed with ED provider. Management plans discussed with the patient, family and they are in agreement.  CODE STATUS: DNR  TOTAL TIME TAKING CARE OF THIS PATIENT: 45 minutes.    Berna Spare Mayo M.D on 03/08/2019 at 12:38 PM  Between 7am to 6pm - Pager - [phone]  After 6pm go to [email or web address] - Technical brewer [place]  Office  [phone]  CC: Primary care physician; Frazier Richards, MD   Note: This dictation was prepared with Dragon dictation  along with smaller phrase technology. Any transcriptional errors that result from this process are unintentional.

## 2019-03-08 NOTE — ED Provider Notes (Signed)
[facility] Emergency Department Provider Note  Time seen: 10:06 AM  I have reviewed the triage vital signs and the nursing notes.   HISTORY  Chief Complaint Shortness of Breath   HPI Michelle [name] is a 58 y.o. female with a past medical history of anxiety, CHF, COPD, hypertension, hyperlipidemia, has a tracheostomy wears 4 L of oxygen 24/7, presents to the emergency department for increased work of breathing cough and sputum production.  According to the patient and her family member over the past 3 to 4 days the patient has had worsening cough and shortness of breath with increased clear sputum production.   States occasional chest discomfort.  Patient arrives via EMS attempting to sit up in the stretcher due to difficulty breathing, mild respiratory distress.  Denies any known fever at home 99.1 in the emergency department.  Past Medical History:  Diagnosis Date  . Allergy   . Anxiety   . Asthma   . CHF (congestive heart failure) ([place])   . Cocaine abuse ([place])   . COPD (chronic obstructive pulmonary disease) ([place])   . Coronary artery disease   . Emphysema/COPD ([place])   . Hepatitis C   . High cholesterol   . Hypertension   . Pneumothorax   . Tobacco abuse     Patient Active Problem List   Diagnosis Date Noted  . Chronic respiratory failure requiring continuous mechanical ventilation through tracheostomy ([place]) 10/20/2018  . CAD (coronary artery disease) 10/20/2018  . HTN (hypertension) 10/20/2018  . Chronic diastolic CHF (congestive heart failure) ([place]) 10/20/2018  . Pneumonia 09/17/2018  . Panic disorder 10/29/2017  . Altered mental status   . Pressure injury of skin 10/23/2017  . Acute on chronic respiratory failure with hypoxia and hypercapnia ([place]) 06/29/2017  . Acute on chronic respiratory failure with hypoxia ([place]) 02/23/2017  . Protein-calorie malnutrition, severe 05/23/2016  . Sepsis ([place]) 05/22/2016  . Malnutrition of moderate degree ([place])  10/25/2014  . COPD exacerbation ([place]) 10/24/2014  . Tobacco abuse 10/24/2014    Past Surgical History:  Procedure Laterality Date  . CARDIAC CATHETERIZATION    . CHEST TUBE INSERTION    . IR GASTROSTOMY TUBE MOD SED  10/31/2017  . TRACHEOSTOMY TUBE PLACEMENT N/A 10/17/2017   Procedure: TRACHEOSTOMY;  Surgeon: Carloyn Manner, MD;  Location: [facility] ORS;  Service: ENT;  Laterality: N/A;    Prior to Admission medications   Medication Sig Start Date End Date Taking? Authorizing Provider  AMBULATORY NON FORMULARY MEDICATION Medication Name: Pulse oximeter DME:[facility] DX J44.9....[id number] 01/08/18   Flora Lipps, MD  aspirin 81 MG chewable tablet Chew 81 mg by mouth at bedtime.     [provider]  budesonide (PULMICORT) 0.25 MG/2ML nebulizer solution Take 2 mLs (0.25 mg total) by nebulization 2 (two) times daily. 12/31/17   Conforti, John, DO  cetirizine (ZYRTEC) 10 MG tablet Place 10 mg into feeding tube daily.     [provider]  clonazePAM (KLONOPIN) 0.5 MG tablet Place 1 tablet (0.5 mg total) into feeding tube daily. Patient taking differently: Place 1 mg into feeding tube 2 (two) times daily. (may also take as needed for anxiety) 01/07/18   Flora Lipps, MD  famotidine (PEPCID) 20 MG tablet Place 1 tablet (20 mg total) into feeding tube daily. 12/31/17   Conforti, John, DO  fentaNYL (DURAGESIC - DOSED MCG/HR) 25 MCG/HR patch Place 1 patch (25 mcg total) onto the skin every 3 (three) days. 01/07/18   Flora Lipps, MD  fluticasone (  FLONASE) 50 MCG/ACT nasal spray Place 1 spray into both nostrils daily.     [provider]  formoterol (PERFOROMIST) 20 MCG/2ML nebulizer solution Take 2 mLs (20 mcg total) by nebulization 2 (two) times daily. 12/08/18   Flora Lipps, MD  ipratropium-albuterol (DUONEB) 0.5-2.5 (3) MG/3ML SOLN Take 3 mLs by nebulization every 6 (six) hours. Patient taking differently: Take 3 mLs by nebulization every 6 (six) hours as needed (for shortness of breath).   12/31/17   Conforti, John, DO  mirtazapine (REMERON) 30 MG tablet Take 30 mg by mouth at bedtime.    [provider]  oxyCODONE-acetaminophen (PERCOCET/ROXICET) 5-325 MG tablet Place 1 tablet into feeding tube every 8 (eight) hours as needed for severe pain. 01/07/18   Flora Lipps, MD  PARoxetine (PAXIL) 20 MG tablet Place 1 tablet (20 mg total) into feeding tube at bedtime. 12/31/17   Conforti, John, DO  polyethylene glycol (MIRALAX / GLYCOLAX) 17 g packet Place 17 g into feeding tube daily.    [provider]  predniSONE (DELTASONE) 20 MG tablet Take 2 tablets (40 mg total) by mouth daily with breakfast. Patient not taking: Reported on 12/08/2018 10/23/18   Awilda Bill, NP  predniSONE (DELTASONE) 20 MG tablet Take 2 tablets (40 mg total) by mouth daily with breakfast. 10 days 12/08/18   Flora Lipps, MD  QUEtiapine (SEROQUEL) 25 MG tablet Place 1 tablet (25 mg total) into feeding tube at bedtime. 12/31/17   Conforti, John, DO  senna (SENOKOT) 8.6 MG TABS tablet Place 1-2 tablets into feeding tube at bedtime.     [provider]  simvastatin (ZOCOR) 20 MG tablet Take 40 mg by mouth every evening.     [provider]    No Known Allergies  Family History  Problem Relation Age of Onset  . Lung cancer Mother   . Asthma Mother   . CVA Father   . CAD Father   . Stroke Father   . Breast cancer Maternal Aunt 67  . COPD Sister     Social History Social History   Tobacco Use  . Smoking status: Former Smoker    Packs/day: 0.10    Years: 41.00    Pack years: 4.10    Types: Cigarettes    Quit date: 06/09/2017    Years since quitting: 1.7  . Smokeless tobacco: Never Used  Substance Use Topics  . Alcohol use: No    Frequency: Never  . Drug use: Yes    Types: "Crack" cocaine    Comment: Past cocaine use.  Quit 04/2017    Review of Systems Constitutional: Negative for fever. Cardiovascular: Negative for chest pain. Respiratory: Positive for shortness of  breath.  Positive for cough with sputum production. Gastrointestinal: Negative for abdominal pain Skin: Negative for skin complaints  Neurological: Negative for headache All other ROS negative  ____________________________________________   PHYSICAL EXAM:  VITAL SIGNS: ED Triage Vitals  Enc Vitals Group     BP 03/08/19 0952 (!) 169/115     Pulse Rate 03/08/19 0952 (!) 117     Resp 03/08/19 0952 (!) 24     Temp 03/08/19 0952 99.1 F (37.3 C)     Temp Source 03/08/19 0952 Oral     SpO2 03/08/19 0946 97 %     Weight 03/08/19 0958 164 lb (74.4 kg)     Height 03/08/19 0958 5\' 1"  (1.549 m)     Head Circumference --      Peak Flow --  Pain Score --      Pain Loc --      Pain Edu? --      Excl. in [place]? --     Constitutional: Alert.  Mild distress due to respiratory distress attempting to sit upright in the stretcher. Eyes: Normal exam ENT      Head: Normocephalic and atraumatic.      Mouth/Throat: Mucous membranes are moist. Cardiovascular: Regular rhythm rate around 120 bpm.  No obvious murmur. Respiratory: Overall clear breath sounds without any obvious wheeze rales or rhonchi.  Patient is tachypneic around 30 breaths/min. Gastrointestinal: Soft and nontender. No distention.  Musculoskeletal: Nontender with normal range of motion in all extremities.  Neurologic:  Normal speech and language.  Skin:  Skin is warm, dry and intact.  Psychiatric: Mood and affect are normal.   ____________________________________________    EKG  EKG viewed and interpreted by myself shows sinus tachycardia 121 bpm with a narrow QRS, normal axis, normal intervals besides slight QTC prolongation, nonspecific but no concerning ST changes  ____________________________________________    RADIOLOGY  X-ray negative  ____________________________________________   INITIAL IMPRESSION / ASSESSMENT AND PLAN / ED COURSE  Pertinent labs & imaging results that were available during my care of  the patient were reviewed by me and considered in my medical decision making (see chart for details).   Patient presents to the emergency department for difficulty breathing mild respiratory distress upon arrival.  Patient has tracheostomy wears 4 L of oxygen at all times.  We have transitioned the patient to a BiPAP through her tracheostomy which has improved the patient's work of breathing significantly.  We will check labs, corona test, chest x-ray and continue to closely monitor.  Differential at this time would include pneumonia, pneumothorax, COPD exacerbation, CHF/pulmonary edema, ACS or viral infection such as coronavirus.  Labs are largely at baseline.  Chest x-ray negative.  Patient continues to state difficulty breathing, remains on BiPAP through her tracheostomy.  Satting 100% on 30 to 40% oxygen supplementation.  We will dose duo nebs, Solu-Medrol and admit to the hospitalist service for ongoing treatment.  Patient agreeable to plan of care.   ANTHONY SCHAAD was evaluated in Emergency Department on 03/08/2019 for the symptoms described in the history of present illness. She was evaluated in the context of the global COVID-19 pandemic, which necessitated consideration that the patient might be at risk for infection with the SARS-CoV-2 virus that causes COVID-19. Institutional protocols and algorithms that pertain to the evaluation of patients at risk for COVID-19 are in a state of rapid change based on information released by regulatory bodies including the CDC and federal and state organizations. These policies and algorithms were followed during the patient's care in the ED.  ____________________________________________   FINAL CLINICAL IMPRESSION(S) / ED DIAGNOSES  Dyspnea Upper respiratory infection   Harvest Dark, MD 03/08/19 1226

## 2019-03-09 ENCOUNTER — Inpatient Hospital Stay: Payer: Medicaid Other

## 2019-03-09 DIAGNOSIS — J9621 Acute and chronic respiratory failure with hypoxia: Secondary | ICD-10-CM

## 2019-03-09 LAB — CBC
HCT: 37.3 % (ref 36.0–46.0)
Hemoglobin: 10.7 g/dL — ABNORMAL LOW (ref 12.0–15.0)
MCH: 23.4 pg — ABNORMAL LOW (ref 26.0–34.0)
MCHC: 28.7 g/dL — ABNORMAL LOW (ref 30.0–36.0)
MCV: 81.4 fL (ref 80.0–100.0)
Platelets: 328 10*3/uL (ref 150–400)
RBC: 4.58 MIL/uL (ref 3.87–5.11)
RDW: 15 % (ref 11.5–15.5)
WBC: 17.8 10*3/uL — ABNORMAL HIGH (ref 4.0–10.5)
nRBC: 0 % (ref 0.0–0.2)

## 2019-03-09 LAB — PHOSPHORUS: Phosphorus: 4.6 mg/dL (ref 2.5–4.6)

## 2019-03-09 LAB — BASIC METABOLIC PANEL
Anion gap: 13 (ref 5–15)
BUN: 15 mg/dL (ref 6–20)
CO2: 25 mmol/L (ref 22–32)
Calcium: 8.6 mg/dL — ABNORMAL LOW (ref 8.9–10.3)
Chloride: 101 mmol/L (ref 98–111)
Creatinine, Ser: 0.8 mg/dL (ref 0.44–1.00)
GFR calc Af Amer: >60 mL/min (ref >60)
GFR calc non Af Amer: >60 mL/min (ref >60)
Glucose, Bld: 248 mg/dL — ABNORMAL HIGH (ref 70–99)
Potassium: 4.7 mmol/L (ref 3.5–5.1)
Sodium: 139 mmol/L (ref 135–145)

## 2019-03-09 LAB — GLUCOSE, CAPILLARY
Glucose-Capillary: 221 mg/dL — ABNORMAL HIGH (ref 70–99)
Glucose-Capillary: 214 mg/dL — ABNORMAL HIGH (ref 70–99)
Glucose-Capillary: 247 mg/dL — ABNORMAL HIGH (ref 70–99)

## 2019-03-09 LAB — HEMOGLOBIN A1C
Hgb A1c MFr Bld: 6.7 % — ABNORMAL HIGH (ref 4.8–5.6)
Mean Plasma Glucose: 145.59 mg/dL

## 2019-03-09 LAB — MAGNESIUM: Magnesium: 1.9 mg/dL (ref 1.7–2.4)

## 2019-03-09 MED ORDER — VITAL 1.5 CAL PO LIQD
1000.0000 mL | ORAL | Status: DC
  Administered 2019-03-09 – 2019-03-13 (×5): 1000 mL

## 2019-03-09 MED ORDER — AMLODIPINE BESYLATE 5 MG PO TABS
5.0000 mg | ORAL_TABLET | Freq: Every day | ORAL | Status: DC
  Administered 2019-03-09 – 2019-03-14 (×6): 5 mg via ORAL
  Filled 2019-03-09 (×6): qty 1

## 2019-03-09 MED ORDER — MORPHINE SULFATE (PF) 2 MG/ML IV SOLN
INTRAVENOUS | Status: AC
  Filled 2019-03-09: qty 1

## 2019-03-09 MED ORDER — LORAZEPAM 2 MG/ML IJ SOLN
INTRAMUSCULAR | Status: AC
  Filled 2019-03-09: qty 1

## 2019-03-09 MED ORDER — LORAZEPAM 2 MG/ML IJ SOLN
0.5000 mg | Freq: Once | INTRAMUSCULAR | Status: AC
  Administered 2019-03-09: 05:00:00 0.5 mg via INTRAVENOUS

## 2019-03-09 MED ORDER — METHYLPREDNISOLONE SODIUM SUCC 125 MG IJ SOLR
80.0000 mg | Freq: Two times a day (BID) | INTRAMUSCULAR | Status: DC
  Administered 2019-03-09 – 2019-03-10 (×2): 80 mg via INTRAVENOUS
  Filled 2019-03-09 (×2): qty 2

## 2019-03-09 MED ORDER — FUROSEMIDE 10 MG/ML IJ SOLN
20.0000 mg | Freq: Every day | INTRAMUSCULAR | Status: DC
  Administered 2019-03-09 – 2019-03-14 (×6): 20 mg via INTRAVENOUS
  Filled 2019-03-09 (×6): qty 2

## 2019-03-09 MED ORDER — VITAL HIGH PROTEIN PO LIQD: 1000.0000 mL | ORAL | Status: DC

## 2019-03-09 MED ORDER — IPRATROPIUM-ALBUTEROL 0.5-2.5 (3) MG/3ML IN SOLN
3.0000 mL | RESPIRATORY_TRACT | Status: DC
  Administered 2019-03-09 – 2019-03-13 (×26): 3 mL via RESPIRATORY_TRACT
  Filled 2019-03-09 (×26): qty 3

## 2019-03-09 MED ORDER — PRO-STAT SUGAR FREE PO LIQD
30.0000 mL | Freq: Two times a day (BID) | ORAL | Status: DC
  Administered 2019-03-09 – 2019-03-13 (×9): 30 mL

## 2019-03-09 MED ORDER — MORPHINE SULFATE (PF) 2 MG/ML IV SOLN
2.0000 mg | Freq: Once | INTRAVENOUS | Status: AC
  Administered 2019-03-09: 05:00:00 2 mg via INTRAVENOUS

## 2019-03-09 MED ORDER — ADULT MULTIVITAMIN LIQUID CH
15.0000 mL | Freq: Every day | ORAL | Status: DC
  Administered 2019-03-09 – 2019-03-14 (×6): 15 mL
  Filled 2019-03-09 (×6): qty 15

## 2019-03-09 MED ORDER — INSULIN ASPART 100 UNIT/ML ~~LOC~~ SOLN
0.0000 [IU] | SUBCUTANEOUS | Status: DC
  Administered 2019-03-09 (×3): 5 [IU] via SUBCUTANEOUS
  Administered 2019-03-10: 8 [IU] via SUBCUTANEOUS
  Administered 2019-03-10: 17:00:00 5 [IU] via SUBCUTANEOUS
  Administered 2019-03-10: 8 [IU] via SUBCUTANEOUS
  Administered 2019-03-10 (×2): 5 [IU] via SUBCUTANEOUS
  Administered 2019-03-10: 11 [IU] via SUBCUTANEOUS
  Administered 2019-03-11 (×2): 5 [IU] via SUBCUTANEOUS
  Administered 2019-03-11: 08:00:00 8 [IU] via SUBCUTANEOUS
  Administered 2019-03-11: 11 [IU] via SUBCUTANEOUS
  Administered 2019-03-11: 8 [IU] via SUBCUTANEOUS
  Administered 2019-03-11: 11 [IU] via SUBCUTANEOUS
  Administered 2019-03-12 (×2): 5 [IU] via SUBCUTANEOUS
  Administered 2019-03-12 (×2): 8 [IU] via SUBCUTANEOUS
  Administered 2019-03-12: 2 [IU] via SUBCUTANEOUS
  Administered 2019-03-12: 12:00:00 8 [IU] via SUBCUTANEOUS
  Administered 2019-03-12 – 2019-03-13 (×2): 5 [IU] via SUBCUTANEOUS
  Administered 2019-03-13 (×4): 3 [IU] via SUBCUTANEOUS
  Administered 2019-03-13: 20:00:00 5 [IU] via SUBCUTANEOUS
  Administered 2019-03-14: 04:00:00 3 [IU] via SUBCUTANEOUS
  Administered 2019-03-14: 12:00:00 6 [IU] via SUBCUTANEOUS
  Administered 2019-03-14: 2 [IU] via SUBCUTANEOUS
  Filled 2019-03-09 (×32): qty 1

## 2019-03-09 MED ORDER — LORAZEPAM 2 MG/ML IJ SOLN
1.0000 mg | INTRAMUSCULAR | Status: DC | PRN
  Administered 2019-03-09 – 2019-03-10 (×2): 1 mg via INTRAVENOUS
  Filled 2019-03-09 (×3): qty 1

## 2019-03-09 NOTE — Progress Notes (Signed)
[place] [facility] [place] NAME: Michelle [name]    MR#:  [id number]  DATE OF BIRTH:  01-16-1961  SUBJECTIVE:   Patient states she is feeling just a little bit better this morning.  She still remains pretty short of breath.  She states that she is very anxious due to her respiratory distress and she is asking for something to help relax her.  REVIEW OF SYSTEMS:  Review of Systems  Constitutional: Negative for chills and fever.  HENT: Negative for congestion and sore throat.   Eyes: Negative for blurred vision and double vision.  Respiratory: Positive for cough, shortness of breath and wheezing.   Cardiovascular: Negative for chest pain and palpitations.  Gastrointestinal: Negative for nausea and vomiting.  Genitourinary: Negative for dysuria and urgency.  Musculoskeletal: Negative for back pain and neck pain.  Neurological: Negative for dizziness and headaches.  Psychiatric/Behavioral: Negative for depression. The patient is nervous/anxious.     DRUG ALLERGIES:   Allergies  Allergen Reactions  . Amoxicillin    VITALS:  Blood pressure (!) 144/132, pulse 98, temperature 98.6 F (37 C), temperature source Oral, resp. rate 14, height 5' (1.524 m), weight 87.1 kg, last menstrual period 03/06/2005, SpO2 100 %. PHYSICAL EXAMINATION:  Physical Exam  GENERAL:  Laying in the bed with no acute distress.  In mild respiratory distress. HEENT: Head atraumatic, normocephalic. Pupils equal, round, reactive to light and accommodation. No scleral icterus. Extraocular muscles intact. Oropharynx and nasopharynx clear.  NECK:  Supple, no jugular venous distention. No thyroid enlargement. +trach LUNGS: + Diminished breath sounds throughout all lung fields.  Scattered expiratory wheezing present.  Mild to moderately increased work of breathing.  CARDIOVASCULAR: S1, S2 normal. No murmurs, rubs, or gallops.  ABDOMEN: Soft, nontender, nondistended. Bowel sounds  present. +PEG EXTREMITIES: No pedal edema, cyanosis, or clubbing.  NEUROLOGIC: CN 2-12 intact, no focal deficits. 5/5 muscle strength throughout all extremities. Sensation intact throughout. Gait not checked.  PSYCHIATRIC: The patient is alert and oriented x 3.  SKIN: No obvious rash, lesion, or ulcer.  LABORATORY PANEL:  Female CBC Recent Labs  Lab 03/09/19 0626  WBC 17.8*  HGB 10.7*  HCT 37.3  PLT 328   ------------------------------------------------------------------------------------------------------------------ Chemistries  Recent Labs  Lab 03/08/19 1009 03/09/19 0626  NA 139 139  K 4.2 4.7  CL 106 101  CO2 22 25  GLUCOSE 176* 248*  BUN 8 15  CREATININE 0.66 0.80  CALCIUM 9.1 8.6*  MG  --  1.9  AST 31  --   ALT 21  --   ALKPHOS 122  --   BILITOT 0.7  --    RADIOLOGY:  Dg Chest Port 1 View  Result Date: 03/09/2019 CLINICAL DATA:  Acute respiratory failure EXAM: PORTABLE CHEST 1 VIEW COMPARISON:  03/08/2019 FINDINGS: The tracheostomy tube terminates above the carina. There are streaky bibasilar airspace opacities with blunting of the left costophrenic angle. The opacity at the left lung base has significantly improved from prior study. There is no pneumothorax. There is no acute osseous abnormality. IMPRESSION: Improving bibasilar airspace opacities and trace left pleural effusion. Electronically Signed   By: Constance Holster M.D.   On: 03/09/2019 07:52   ASSESSMENT AND PLAN:   Acute on chronic hypoxic respiratory failure secondary to COPD exacerbation- patient is on 3 L trach collar at baseline.  Remains on ventilator this morning. -Sputum culture with moderate serratia marcescens  -Vent management per CCM -COVID test is negative -Continue  IV Solu-Medrol -DuoNebs scheduled -Continue home inhalers -Continue azithromycin -RVP pending -Continue lasix 20mg  IV daily -Wean off vent as able  Sepsis- meeting sepsis criteria on admission with leukocytosis,  tachycardia, and tachypnea.  Likely due to COPD exacerbation.  Chest x-ray without pneumonia. Sepsis has resolved. -UA and urine culture ordered -Blood cultures with no growth to date -Azithromycin as above  Type 2 diabetes- new diagnosis. A1c 6.5% this admission. -Continue SSI  Hypertension- BP has been elevated since admission -May need to start daily BP med -IV Labetalol prn  Chronic diastolic CHF- no signs of volume overload. BNP normal. -Lasix 20mg  IV daily  CAD- stable, no active chest pain. Initial troponin negative. -Continue home aspirin  Chronic pain syndrome-stable -Continue home fentanyl patch  Hyperlipidemia- stable -Continue home Zocor  Chronic anxiety with panic disorder-stable -Continue home Remeron, Paxil, Seroquel, Klonopin  All the records are reviewed and case discussed with Care Management/Social Worker. Management plans discussed with the patient, family and they are in agreement.  CODE STATUS: DNR  TOTAL TIME TAKING CARE OF THIS PATIENT: 45 minutes.   More than 50% of the time was spent in counseling/coordination of care: YES  POSSIBLE D/C IN 3-5 DAYS, DEPENDING ON CLINICAL CONDITION.   Berna Spare Mayo M.D on 03/09/2019 at 2:54 PM  Between 7am to 6pm - Pager [phone]  After 6pm go to [email or web address] - [employer] [place] [facility]  Office  [phone]  CC: Primary care physician; Frazier Richards, MD  Note: This dictation was prepared with Dragon dictation along with smaller phrase technology. Any transcriptional errors that result from this process are unintentional.

## 2019-03-09 NOTE — Progress Notes (Signed)
Follow up - Critical Care Medicine Note  Patient Details:    Michelle [name] is an 58 y.o. female with known end-stage COPD, chronically ventilator dependent, admitted on 10/4 due to acute on chronic respiratory failure after noting increasing shortness of breath for 2 weeks prior to admission.  Patient also noted increasing secretions through her tracheostomy.  Patient is chronically on a trilogy vent at home.  Lines, Airways, Drains: Gastrostomy/Enterostomy Gastrostomy 20 Fr. LUQ (Active)     External Urinary Catheter (Active)  Collection Container Dedicated Suction Canister 03/09/19 2150  Securement Method Other (Comment) 03/09/19 2150  Site Assessment Clean;Intact;Dry 03/09/19 2150  Output (mL) 900 mL 03/09/19 2150    Anti-infectives:  Anti-infectives (From admission, onward)   Start     Dose/Rate Route Frequency Ordered Stop   03/08/19 1800  azithromycin (ZITHROMAX) 500 mg in sodium chloride 0.9 % 250 mL IVPB     500 mg 250 mL/hr over 60 Minutes Intravenous Every 24 hours 03/08/19 1652     03/08/19 1515  doxycycline (VIBRA-TABS) tablet 100 mg  Status:  Discontinued     100 mg Per Tube Every 12 hours 03/08/19 1512 03/08/19 1652      Microbiology: Results for orders placed or performed during the hospital encounter of 03/08/19  SARS Coronavirus 2 [facility] order, Performed in [facility] hospital lab) Nasopharyngeal Nasopharyngeal Swab     Status: None   Collection Time: 03/08/19 10:09 AM   Specimen: Nasopharyngeal Swab  Result Value Ref Range Status   SARS Coronavirus 2 NEGATIVE NEGATIVE Final    Comment: (NOTE) If result is NEGATIVE SARS-CoV-2 target nucleic acids are NOT DETECTED. The SARS-CoV-2 RNA is generally detectable in upper and lower  respiratory specimens during the acute phase of infection. The lowest  concentration of SARS-CoV-2 viral copies this assay can detect is 250  copies / mL. A negative result does not preclude SARS-CoV-2 infection  and should not be  used as the sole basis for treatment or other  patient management decisions.  A negative result may occur with  improper specimen collection / handling, submission of specimen other  than nasopharyngeal swab, presence of viral mutation(s) within the  areas targeted by this assay, and inadequate number of viral copies  (<250 copies / mL). A negative result must be combined with clinical  observations, patient history, and epidemiological information. If result is POSITIVE SARS-CoV-2 target nucleic acids are DETECTED. The SARS-CoV-2 RNA is generally detectable in upper and lower  respiratory specimens dur ing the acute phase of infection.  Positive  results are indicative of active infection with SARS-CoV-2.  Clinical  correlation with patient history and other diagnostic information is  necessary to determine patient infection status.  Positive results do  not rule out bacterial infection or co-infection with other viruses. If result is PRESUMPTIVE POSTIVE SARS-CoV-2 nucleic acids MAY BE PRESENT.   A presumptive positive result was obtained on the submitted specimen  and confirmed on repeat testing.  While 2019 novel coronavirus  (SARS-CoV-2) nucleic acids may be present in the submitted sample  additional confirmatory testing may be necessary for epidemiological  and / or clinical management purposes  to differentiate between  SARS-CoV-2 and other Sarbecovirus currently known to infect humans.  If clinically indicated additional testing with an alternate test  methodology [id number]) is advised. The SARS-CoV-2 RNA is generally  detectable in upper and lower respiratory sp ecimens during the acute  phase of infection. The expected result is Negative. Fact Sheet for Patients:  StrictlyIdeas.no Fact Sheet for Healthcare Providers: BankingDealers.co.za This test is not yet approved or cleared by the [place] FDA and has been authorized  for detection and/or diagnosis of SARS-CoV-2 by FDA under an Emergency Use Authorization (EUA).  This EUA will remain in effect (meaning this test can be used) for the duration of the COVID-19 declaration under Section 564(b)(1) of the Act, 21 U.S.C. section 360bbb-3(b)(1), unless the authorization is terminated or revoked sooner. Performed at [facility], [address] [place], [place] [address]   Culture, respiratory (non-expectorated)     Status: None (Preliminary result)   Collection Time: 03/08/19 10:10 AM   Specimen: Tracheal Aspirate; Respiratory  Result Value Ref Range Status   Specimen Description   Final    TRACHEAL ASPIRATE Performed at [facility], [address], [place], [place] [address]    Special Requests   Final    NONE Performed at [facility], [address] [place], [place] [address]    Gram Stain   Final    RARE WBC PRESENT, PREDOMINANTLY PMN FEW GRAM POSITIVE RODS RARE GRAM POSITIVE COCCI RARE YEAST    Culture   Final    MODERATE SERRATIA MARCESCENS CULTURE REINCUBATED FOR BETTER GROWTH Performed at [facility], [address] [place], [place] [address]    Report Status PENDING  Incomplete  MRSA PCR Screening     Status: None   Collection Time: 03/08/19  3:13 PM   Specimen: Nasopharyngeal  Result Value Ref Range Status   MRSA by PCR NEGATIVE NEGATIVE Final    Comment:        The GeneXpert MRSA Assay (FDA approved for NASAL specimens only), is one component of a comprehensive MRSA colonization surveillance program. It is not intended to diagnose MRSA infection nor to guide or monitor treatment for MRSA infections. Performed at [facility], [address] [place], [place] [address]   CULTURE, BLOOD (ROUTINE X 2) w Reflex to ID Panel     Status: None (Preliminary result)   Collection Time: 03/08/19  3:50 PM   Specimen: BLOOD  Result Value Ref Range Status   Specimen Description BLOOD LEFT  ANTECUBITAL  Final   Special Requests   Final    BOTTLES DRAWN AEROBIC AND ANAEROBIC Blood Culture adequate volume   Culture   Final    NO GROWTH < 24 HOURS Performed at [facility], [address], [place], [place] [address]    Report Status PENDING  Incomplete  CULTURE, BLOOD (ROUTINE X 2) w Reflex to ID Panel     Status: None (Preliminary result)   Collection Time: 03/08/19  3:50 PM   Specimen: BLOOD  Result Value Ref Range Status   Specimen Description BLOOD BLOOD RIGHT HAND  Final   Special Requests   Final    BOTTLES DRAWN AEROBIC AND ANAEROBIC Blood Culture results may not be optimal due to an inadequate volume of blood received in culture bottles   Culture   Final    NO GROWTH < 24 HOURS Performed at [facility], [address], [place], [place] [address]    Report Status PENDING  Incomplete    Best Practice/Protocols:  VTE Prophylaxis: Lovenox (prophylaxtic dose) GI Prophylaxis: Antihistamine   Events: 10/4 admitted for acute on chronic respiratory failure   Studies: Dg Chest Port 1 View  Result Date: 03/09/2019 CLINICAL DATA:  Acute respiratory failure EXAM: PORTABLE CHEST 1 VIEW COMPARISON:  03/08/2019 FINDINGS: The tracheostomy tube  terminates above the carina. There are streaky bibasilar airspace opacities with blunting of the left costophrenic angle. The opacity at the left lung base has significantly improved from prior study. There is no pneumothorax. There is no acute osseous abnormality. IMPRESSION: Improving bibasilar airspace opacities and trace left pleural effusion. Electronically Signed   By: Constance Holster M.D.   On: 03/09/2019 07:52   Dg Chest Portable 1 View  Result Date: 03/08/2019 CLINICAL DATA:  Shortness of breath EXAM: PORTABLE CHEST 1 VIEW COMPARISON:  10/22/2018 FINDINGS: Tracheostomy tube in satisfactory position. The heart size and mediastinal contours are within normal limits. Both lungs are clear. There is mild  bibasilar atelectasis. The visualized skeletal structures are unremarkable. IMPRESSION: No active disease. Electronically Signed   By: Kathreen Devoid   On: 03/08/2019 11:08    Consults:    Subjective:    Overnight Issues: No issues overnight on the ventilator.  She still feels like it is difficult to breathe  Objective:  Vital signs for last 24 hours: Temp:  [98.3 F (36.8 C)-98.6 F (37 C)] 98.3 F (36.8 C) (10/05 2000) Pulse Rate:  [85-110] 99 (10/05 2000) Resp:  [14-20] 15 (10/05 2000) BP: (97-189)/(71-132) 138/92 (10/05 2000) SpO2:  [100 %] 100 % (10/05 2000) FiO2 (%):  [30 %-40 %] 30 % (10/05 2003)  Hemodynamic parameters for last 24 hours:    Intake/Output from previous day: 10/04 0701 - 10/05 0700 In: 261.6 [IV Piggyback:261.6] Out: 800 [Urine:800]  Intake/Output this shift: Total I/O In: -  Out: 900 [Urine:900]  Vent settings for last 24 hours: Vent Mode: PRVC FiO2 (%):  [30 %-40 %] 30 % Set Rate:  [18 bmp] 18 bmp Vt Set:  [450 mL] 450 mL PEEP:  [5 cmH20] 5 cmH20  Physical Exam:  GENERAL: Obese woman, no distress laying supine in bed.  Tracheostomy in place, ventilated. HEAD: Normocephalic, atraumatic.  EYES: PEERL. No scleral icterus.  MOUTH: No thrush. NECK: Supple.No JVD. Trach in place, dressing intact. PULMONARY: Coarse lung sounds,+ rhonchi, no wheezes CARDIOVASCULAR: S1 and S2. Regular rate and rhythm. No murmurs, rubs, or gallops.  GASTROINTESTINAL: Soft, nontender, non-distended. No masses. Positive bowel sounds. No hepatosplenomegaly.  MUSCULOSKELETAL: No swelling, clubbing, or edema.  NEUROLOGIC: No distress noted.  Follows commands.  No focal deficits. SKIN:intact,warm,dry  Assessment/Plan:  1.  Acute on chronic respiratory failure, chronically ventilator dependent: Preliminary sputum culture shows Serratia marcescens. Await final culture.  Continue ventilator support.  Adjusted trach per RT.  Ventilator changes made.  2.  End-stage COPD with  exacerbation: Last known FEV1 was 0.66 L in 2017.  Continue bronchodilators, steroids due to bronchospasm.  3.  Gram-negative pneumonia: Preliminary report has Serratia as noted above, continue antibiotics, changed to cover gram-negative's.  4.  Anxiety disorder: Continue Remeron and Ativan as per home management.  5.  Diabetes mellitus type 2: ICU hyperglycemia protocol.  6.  Nutrition: Continue tube feeds.    LOS: 1 day   Additional comments: Discussed at multidisciplinary rounds  Critical Care Total Time*: 35 Minutes  C. Derrill Kay, MD [place] [facility] 03/09/2019  *Care during the described time interval was provided by me and/or other providers on the critical care team.  I have reviewed this patient's available data, including medical history, events of note, physical examination and test results as part of my evaluation.

## 2019-03-09 NOTE — Progress Notes (Signed)
Initial Nutrition Assessment  DOCUMENTATION CODES:   Obesity unspecified  INTERVENTION:  Initiate Vital 1.5 Cal at 40 mL/hr (960 mL goal daily volume) + Pro-Stat 30 mL BID per G-tube. Provides 1640 kcal, 95 grams of protein, 730 mL H2O daily.  Provide minimum free water flush of 30 mL Q4hrs to maintain tube patency.  Provide liquid MVI daily per tube.  NUTRITION DIAGNOSIS:   Inadequate oral intake related to inability to eat as evidenced by NPO status.  GOAL:   Provide needs based on ASPEN/SCCM guidelines  MONITOR:   Vent status, Labs, Weight trends, TF tolerance, I & O's  REASON FOR ASSESSMENT:   Ventilator    ASSESSMENT:   58 year old female with PMHx of emphysema, COPD, HTN, CAD, hx cocaine abuse, asthma, CHF, anxiety, hepatitis C, hx tracheostomy tube placement 10/17/2017 who uses ventilator at home, hx G-tube placement 10/31/2017 admitted with acute COPD exacerbation.   Met with patient at bedside. She is well-known to this RD from multiple previous admissions. Patient is able to communicate by "mouthing" words and nodding. She is on PRVC mode with FiO2 30% and PEEP 5 cmH2O. Patient reports she was able to get a SLP evaluation while inpatient at [facility]. Did see that she was on a regular texture diet with thin liquids during that admission. She reports that at home she eats by mouth but does not use her PMV. She no longer gives tube feeds per tube. She does still use her tube for medications. Patient has had several recent admissions at [facility] for aspiration PNA. Plan discussed on rounds is to initiate tube feeds at this time and hold off on PO intake. Discussed with patient and she is okay with receiving tube feeds for now.  Patient has gained a significant amount of weight. She was 49.5 kg on 12/25/2017, 69.4 kg on 09/17/2018, and is now 87.1 kg (192.02 lbs). Patient no longer meets criteria for malnutrition. Skin is intact.  Enteral Access: 22 Fr. G-tube present on admission; pt  report tube was recently replaced during her admission at [facility]  MAP: 87-130 mmHg  Patient is currently intubated on ventilator support Ve: 8.6 L/min Temp (24hrs), Avg:98.8 F (37.1 C), Min:98.5 F (36.9 C), Max:99.4 F (37.4 C)  Propofol: N/A  Medications reviewed and include: clonazepam, famotidine, fentanyl patch, Lasix 20 mg daily IV, Novolog 0-15 units Q4hrs, Solu-Medrol 80 mg Q12hrs IV, Remeron 30 mg QHS, Miralax, Seroquel, senna 1-2 tablets daily.  Labs reviewed: CBG 221.  Discussed with RN and on rounds.  NUTRITION - FOCUSED PHYSICAL EXAM:    Most Recent Value  Orbital Region  No depletion  Upper Arm Region  No depletion  Thoracic and Lumbar Region  No depletion  Buccal Region  No depletion  Temple Region  No depletion  Clavicle Bone Region  No depletion  Clavicle and Acromion Bone Region  No depletion  Scapular Bone Region  Unable to assess  Dorsal Hand  No depletion  Patellar Region  No depletion  Anterior Thigh Region  No depletion  Posterior Calf Region  No depletion  Edema (RD Assessment)  None  Hair  Reviewed  Eyes  Reviewed  Mouth  Unable to assess  Skin  Reviewed  Nails  Reviewed     Diet Order:   Diet Order            Diet NPO time specified  Diet effective now             EDUCATION NEEDS:  No education needs have been identified at this time  Skin:  Skin Assessment: Reviewed RN Assessment  Last BM:  PTA  Height:   Ht Readings from Last 1 Encounters:  03/08/19 5' (1.524 m)   Weight:   Wt Readings from Last 1 Encounters:  03/08/19 87.1 kg   Ideal Body Weight:  45.5 kg  BMI:  Body mass index is 37.5 kg/m.  Estimated Nutritional Needs:   Kcal:  1626 (PSU 2003b w/ MSJ 1381, Ve 8.6, Tmax 37.4)  Protein:  95-110 grams  Fluid:  1.2-1.5 L/day  Willey Blade, MS, RD, LDN Office: [phone] Pager: [phone] After Hours/Weekend Pager: [phone]

## 2019-03-10 LAB — GLUCOSE, CAPILLARY
Glucose-Capillary: 296 mg/dL — ABNORMAL HIGH (ref 70–99)
Glucose-Capillary: 301 mg/dL — ABNORMAL HIGH (ref 70–99)
Glucose-Capillary: 293 mg/dL — ABNORMAL HIGH (ref 70–99)
Glucose-Capillary: 243 mg/dL — ABNORMAL HIGH (ref 70–99)
Glucose-Capillary: 212 mg/dL — ABNORMAL HIGH (ref 70–99)
Glucose-Capillary: 234 mg/dL — ABNORMAL HIGH (ref 70–99)

## 2019-03-10 MED ORDER — INSULIN GLARGINE 100 UNIT/ML ~~LOC~~ SOLN
5.0000 [IU] | Freq: Every day | SUBCUTANEOUS | Status: DC
  Administered 2019-03-10 – 2019-03-11 (×2): 5 [IU] via SUBCUTANEOUS
  Filled 2019-03-10 (×2): qty 0.05

## 2019-03-10 MED ORDER — CLONAZEPAM 1 MG PO TABS
2.0000 mg | ORAL_TABLET | Freq: Two times a day (BID) | ORAL | Status: DC
  Administered 2019-03-10 – 2019-03-14 (×8): 2 mg
  Filled 2019-03-10 (×8): qty 2

## 2019-03-10 MED ORDER — FENTANYL 50 MCG/HR TD PT72
1.0000 | MEDICATED_PATCH | TRANSDERMAL | Status: DC
  Administered 2019-03-10 – 2019-03-13 (×2): 1 via TRANSDERMAL
  Filled 2019-03-10 (×2): qty 1

## 2019-03-10 MED ORDER — MIDAZOLAM HCL 2 MG/2ML IJ SOLN
INTRAMUSCULAR | Status: AC
  Administered 2019-03-10: 17:00:00 2 mg via INTRAVENOUS
  Filled 2019-03-10: qty 2

## 2019-03-10 MED ORDER — QUETIAPINE FUMARATE 25 MG PO TABS
25.0000 mg | ORAL_TABLET | Freq: Two times a day (BID) | ORAL | Status: DC
  Administered 2019-03-10 – 2019-03-13 (×6): 25 mg
  Filled 2019-03-10 (×6): qty 1

## 2019-03-10 MED ORDER — IPRATROPIUM-ALBUTEROL 0.5-2.5 (3) MG/3ML IN SOLN: 3.0000 mL | Freq: Four times a day (QID) | RESPIRATORY_TRACT | Status: DC | PRN

## 2019-03-10 MED ORDER — METHYLPREDNISOLONE SODIUM SUCC 40 MG IJ SOLR
40.0000 mg | Freq: Two times a day (BID) | INTRAMUSCULAR | Status: DC
  Administered 2019-03-10 – 2019-03-12 (×4): 40 mg via INTRAVENOUS
  Filled 2019-03-10 (×4): qty 1

## 2019-03-10 MED ORDER — WHITE PETROLATUM EX OINT
TOPICAL_OINTMENT | CUTANEOUS | Status: DC | PRN
  Administered 2019-03-10: 17:00:00 via TOPICAL
  Filled 2019-03-10 (×2): qty 5

## 2019-03-10 MED ORDER — SODIUM CHLORIDE 0.9 % IV SOLN
2.0000 g | Freq: Every day | INTRAVENOUS | Status: DC
  Administered 2019-03-10: 13:00:00 2 g via INTRAVENOUS
  Filled 2019-03-10: qty 20
  Filled 2019-03-10: qty 2

## 2019-03-10 MED ORDER — SODIUM CHLORIDE 0.9 % IV SOLN
1.0000 g | Freq: Three times a day (TID) | INTRAVENOUS | Status: DC
  Administered 2019-03-10 – 2019-03-11 (×3): 1 g via INTRAVENOUS
  Filled 2019-03-10 (×5): qty 1

## 2019-03-10 MED ORDER — MIDAZOLAM HCL 2 MG/2ML IJ SOLN
2.0000 mg | Freq: Once | INTRAMUSCULAR | Status: AC
  Administered 2019-03-10: 17:00:00 2 mg via INTRAVENOUS

## 2019-03-10 MED ORDER — FENTANYL CITRATE (PF) 100 MCG/2ML IJ SOLN
25.0000 ug | INTRAMUSCULAR | Status: DC | PRN
  Administered 2019-03-10 – 2019-03-13 (×9): 25 ug via INTRAVENOUS
  Filled 2019-03-10 (×10): qty 2

## 2019-03-10 MED ORDER — ATORVASTATIN CALCIUM 20 MG PO TABS
20.0000 mg | ORAL_TABLET | Freq: Every day | ORAL | Status: DC
  Administered 2019-03-10 – 2019-03-13 (×4): 20 mg via ORAL
  Filled 2019-03-10 (×4): qty 1

## 2019-03-10 NOTE — Progress Notes (Signed)
The pt requested that she be placed on her home trilogy vent. Dr. Mortimer Fries gave me a verbal order to do so. The pt states she is breathing much better on her home vent.

## 2019-03-10 NOTE — Progress Notes (Signed)
Follow up - Critical Care Medicine Note  Patient Details:    Michelle [name] is an 58 y.o. female  with known end-stage COPD, chronically ventilator dependent, admitted on 10/4 due to acute on chronic respiratory failure after noting increasing shortness of breath for 2 weeks prior to admission.  Patient also noted increasing secretions through her tracheostomy.  Patient is chronically on a Trilogy vent at home.  Lines, Airways, Drains: Gastrostomy/Enterostomy Gastrostomy 20 Fr. LUQ (Active)     External Urinary Catheter (Active)  Collection Container Dedicated Suction Canister 03/09/19 2150  Securement Method Other (Comment) 03/09/19 2150  Site Assessment Clean;Intact;Dry 03/09/19 2150  Output (mL) 900 mL 03/09/19 2150    Anti-infectives:  Anti-infectives (From admission, onward)   Start     Dose/Rate Route Frequency Ordered Stop   03/10/19 1645  ceFEPIme (MAXIPIME) 1 g in sodium chloride 0.9 % 100 mL IVPB     1 g 200 mL/hr over 30 Minutes Intravenous Every 8 hours 03/10/19 1637     03/10/19 1045  cefTRIAXone (ROCEPHIN) 2 g in sodium chloride 0.9 % 100 mL IVPB  Status:  Discontinued     2 g 200 mL/hr over 30 Minutes Intravenous Daily 03/10/19 1041 03/10/19 1637   03/08/19 1800  azithromycin (ZITHROMAX) 500 mg in sodium chloride 0.9 % 250 mL IVPB  Status:  Discontinued     500 mg 250 mL/hr over 60 Minutes Intravenous Every 24 hours 03/08/19 1652 03/10/19 1041   03/08/19 1515  doxycycline (VIBRA-TABS) tablet 100 mg  Status:  Discontinued     100 mg Per Tube Every 12 hours 03/08/19 1512 03/08/19 1652      Microbiology: Results for orders placed or performed during the hospital encounter of 03/08/19  SARS Coronavirus 2 [facility] order, Performed in [facility] hospital lab) Nasopharyngeal Nasopharyngeal Swab     Status: None   Collection Time: 03/08/19 10:09 AM   Specimen: Nasopharyngeal Swab  Result Value Ref Range Status   SARS Coronavirus 2 NEGATIVE NEGATIVE Final    Comment:  (NOTE) If result is NEGATIVE SARS-CoV-2 target nucleic acids are NOT DETECTED. The SARS-CoV-2 RNA is generally detectable in upper and lower  respiratory specimens during the acute phase of infection. The lowest  concentration of SARS-CoV-2 viral copies this assay can detect is 250  copies / mL. A negative result does not preclude SARS-CoV-2 infection  and should not be used as the sole basis for treatment or other  patient management decisions.  A negative result may occur with  improper specimen collection / handling, submission of specimen other  than nasopharyngeal swab, presence of viral mutation(s) within the  areas targeted by this assay, and inadequate number of viral copies  (<250 copies / mL). A negative result must be combined with clinical  observations, patient history, and epidemiological information. If result is POSITIVE SARS-CoV-2 target nucleic acids are DETECTED. The SARS-CoV-2 RNA is generally detectable in upper and lower  respiratory specimens dur ing the acute phase of infection.  Positive  results are indicative of active infection with SARS-CoV-2.  Clinical  correlation with patient history and other diagnostic information is  necessary to determine patient infection status.  Positive results do  not rule out bacterial infection or co-infection with other viruses. If result is PRESUMPTIVE POSTIVE SARS-CoV-2 nucleic acids MAY BE PRESENT.   A presumptive positive result was obtained on the submitted specimen  and confirmed on repeat testing.  While 2019 novel coronavirus  (SARS-CoV-2) nucleic acids may be present  in the submitted sample  additional confirmatory testing may be necessary for epidemiological  and / or clinical management purposes  to differentiate between  SARS-CoV-2 and other Sarbecovirus currently known to infect humans.  If clinically indicated additional testing with an alternate test  methodology [phone]) is advised. The SARS-CoV-2 RNA is  generally  detectable in upper and lower respiratory sp ecimens during the acute  phase of infection. The expected result is Negative. Fact Sheet for Patients:  StrictlyIdeas.no Fact Sheet for Healthcare Providers: BankingDealers.co.za This test is not yet approved or cleared by the [place] FDA and has been authorized for detection and/or diagnosis of SARS-CoV-2 by FDA under an Emergency Use Authorization (EUA).  This EUA will remain in effect (meaning this test can be used) for the duration of the COVID-19 declaration under Section 564(b)(1) of the Act, 21 U.S.C. section 360bbb-3(b)(1), unless the authorization is terminated or revoked sooner. Performed at [facility], [address] [place], [place] [address]   Culture, respiratory (non-expectorated)     Status: None (Preliminary result)   Collection Time: 03/08/19 10:10 AM   Specimen: Tracheal Aspirate; Respiratory  Result Value Ref Range Status   Specimen Description   Final    TRACHEAL ASPIRATE Performed at [facility], [address], [place], [place] [address]    Special Requests   Final    NONE Performed at [facility], [address] [place], [place] [address]    Gram Stain   Final    RARE WBC PRESENT, PREDOMINANTLY PMN FEW GRAM POSITIVE RODS RARE GRAM POSITIVE COCCI RARE YEAST    Culture   Final    MODERATE SERRATIA MARCESCENS MODERATE PSEUDOMONAS AERUGINOSA SUSCEPTIBILITIES TO FOLLOW Performed at [facility], [place] [address] [place], [place] [address]    Report Status PENDING  Incomplete   Organism ID, Bacteria SERRATIA MARCESCENS  Final      Susceptibility   Serratia marcescens - MIC*    CEFAZOLIN >=64 RESISTANT Resistant     CEFEPIME <=1 SENSITIVE Sensitive     CEFTAZIDIME <=1 SENSITIVE Sensitive     CEFTRIAXONE <=1 SENSITIVE Sensitive     CIPROFLOXACIN <=0.25 SENSITIVE Sensitive     GENTAMICIN <=1 SENSITIVE  Sensitive     TRIMETH/SULFA <=20 SENSITIVE Sensitive     * MODERATE SERRATIA MARCESCENS  MRSA PCR Screening     Status: None   Collection Time: 03/08/19  3:13 PM   Specimen: Nasopharyngeal  Result Value Ref Range Status   MRSA by PCR NEGATIVE NEGATIVE Final    Comment:        The GeneXpert MRSA Assay (FDA approved for NASAL specimens only), is one component of a comprehensive MRSA colonization surveillance program. It is not intended to diagnose MRSA infection nor to guide or monitor treatment for MRSA infections. Performed at [facility], [address] [place], [place] [address]   CULTURE, BLOOD (ROUTINE X 2) w Reflex to ID Panel     Status: None (Preliminary result)   Collection Time: 03/08/19  3:50 PM   Specimen: BLOOD  Result Value Ref Range Status   Specimen Description BLOOD LEFT ANTECUBITAL  Final   Special Requests   Final    BOTTLES DRAWN AEROBIC AND ANAEROBIC Blood Culture adequate volume   Culture   Final    NO GROWTH 2 DAYS Performed at [facility], [address] [place], [place] [address]    Report Status PENDING  Incomplete  CULTURE, BLOOD (ROUTINE X  2) w Reflex to ID Panel     Status: None (Preliminary result)   Collection Time: 03/08/19  3:50 PM   Specimen: BLOOD  Result Value Ref Range Status   Specimen Description BLOOD BLOOD RIGHT HAND  Final   Special Requests   Final    BOTTLES DRAWN AEROBIC AND ANAEROBIC Blood Culture results may not be optimal due to an inadequate volume of blood received in culture bottles   Culture   Final    NO GROWTH 2 DAYS Performed at [facility], [address], [place], [place] [address]    Report Status PENDING  Incomplete    Best Practice/Protocols:  VTE Prophylaxis: Lovenox (prophylaxtic dose) GI Prophylaxis: Antihistamine   Events: 10/4 admitted for acute on chronic respiratory failure 10/5 Serratia in sputum, gram-negative coverage broadened 10/6 Pseudomonas @ sputum as  well  Studies: Dg Chest Port 1 View  Result Date: 03/09/2019 CLINICAL DATA:  Acute respiratory failure EXAM: PORTABLE CHEST 1 VIEW COMPARISON:  03/08/2019 FINDINGS: The tracheostomy tube terminates above the carina. There are streaky bibasilar airspace opacities with blunting of the left costophrenic angle. The opacity at the left lung base has significantly improved from prior study. There is no pneumothorax. There is no acute osseous abnormality. IMPRESSION: Improving bibasilar airspace opacities and trace left pleural effusion. Electronically Signed   By: Constance Holster M.D.   On: 03/09/2019 07:52   Dg Chest Portable 1 View  Result Date: 03/08/2019 CLINICAL DATA:  Shortness of breath EXAM: PORTABLE CHEST 1 VIEW COMPARISON:  10/22/2018 FINDINGS: Tracheostomy tube in satisfactory position. The heart size and mediastinal contours are within normal limits. Both lungs are clear. There is mild bibasilar atelectasis. The visualized skeletal structures are unremarkable. IMPRESSION: No active disease. Electronically Signed   By: Kathreen Devoid   On: 03/08/2019 11:08    Consults: Treatment Team:  Pccm, Ander Gaster, MD   Subjective:    Overnight Issues: Overnight no issues.  She was switched to her home Trilogy vent which she appreciated.  Notes that her dyspnea is markedly improved.  Objective:  Vital signs for last 24 hours: Temp:  [97.5 F (36.4 C)-99 F (37.2 C)] 97.5 F (36.4 C) (10/06 1930) Pulse Rate:  [85-111] 89 (10/06 1800) Resp:  [11-31] 16 (10/06 1800) BP: (104-187)/(65-115) 115/70 (10/06 1700) SpO2:  [97 %-100 %] 100 % (10/06 1930) FiO2 (%):  [30 %] 30 % (10/06 1930)  Hemodynamic parameters for last 24 hours:    Intake/Output from previous day: 10/05 0701 - 10/06 0700 In: 36.7 [NG/GT:36.7; IV Piggyback:0] Out: 1700 [Urine:1700]  Intake/Output this shift: Total I/O In: 44.4 [IV Piggyback:44.4] Out: 0   Vent settings for last 24 hours: Vent Mode: AC FiO2 (%):  [30  %] 30 % Set Rate:  [16 bmp-18 bmp] 16 bmp Vt Set:  [450 mL] 450 mL PEEP:  [5 cmH20] 5 cmH20 Plateau Pressure:  [14 cmH20-15 cmH20] 15 cmH20  Physical Exam:  GENERAL: Obese woman, no distress, comfortable. Tracheostomy in place, ventilated. HEAD: Normocephalic, atraumatic.  EYES: PEERL. No scleral icterus.  MOUTH: No thrush. NECK: Supple.No JVD. Trach in place, dressing intact. PULMONARY:Coarse lung sounds,+ rhonchi, no wheezes CARDIOVASCULAR: S1 and S2. Regular rate and rhythm. No murmurs, rubs, or gallops.  GASTROINTESTINAL: Soft, nontender, non-distended. No masses. Positive bowel sounds. No hepatosplenomegaly.  MUSCULOSKELETAL: No swelling, clubbing, or edema.  NEUROLOGIC: No distress noted.  Follows commands.  No focal deficits. SKIN:intact,warm,dry  Assessment/Plan:  1.  Acute on chronic respiratory failure, chronically ventilator dependent:  Preliminary sputum culture shows Serratia marcescens and Pseudomonas.  Final sensitivities pending.  Stable on current ventilator settings.   2.  End-stage COPD with exacerbation: Last known FEV1 was 0.66 L in 2017.  Continue bronchodilators, continue steroids due to bronchospasm on admission, decreased dose.  3.  Gram-negative pneumonia:  Serratia marcescens and Pseudomonas, patient on cefepime.  4.  Anxiety disorder: Continue Remeron and Ativan as per home management.  5.  Diabetes mellitus type 2: ICU hyperglycemia protocol.  6.  Nutrition: Continue tube feeds.    LOS: 2 days   Additional comments: Discussed at multidisciplinary rounds  Critical Care Total Time*: 35 Minutes  C. Derrill Kay, MD [facility] 03/10/2019  *Care during the described time interval was provided by me and/or other providers on the critical care team.  I have reviewed this patient's available data, including medical history, events of note, physical examination and test results as part of my evaluation.

## 2019-03-10 NOTE — Consult Note (Addendum)
Pharmacy Antibiotic Note  Michelle [name] is a 58 y.o. female admitted on 03/08/2019 with pneumonia. Patient admitted for acute on chronic hypoxic respiratory failure secondary to COPD exacerbation/PNA. She was initially started on azithromycin monotherapy which was converted to ceftriaxone after Serratia was noted to be growing in tracheal aspirate culture. Ceftriaxone was switch to cefepime after culture update showed additional PSA, susceptibilities pending. Pharmacy has been consulted for cefepime dosing.  Patient is intubated and sedated. Patient with PCT of 0.31. Worsening leukocytosis since admission. Afebrile last 24h.   Plan: Cefepime 2 g q8h  Continue to monitor cultures, renal function, and clinical status for signs of improvement with antibiotics.   Height: 5' (152.4 cm) Weight: 192 lb 0.3 oz (87.1 kg) IBW/kg (Calculated) : 45.5  Temp (24hrs), Avg:98.3 F (36.8 C), Min:97.8 F (36.6 C), Max:98.9 F (37.2 C)  Recent Labs  Lab 03/08/19 1009 03/08/19 1526 03/09/19 0626  WBC 13.0*  --  17.8*  CREATININE 0.66  --  0.80  LATICACIDVEN  --  1.5  --     Estimated Creatinine Clearance: 76.1 mL/min (by C-G formula based on SCr of 0.8 mg/dL).    Allergies  Allergen Reactions  . Amoxicillin     Patient has tolerated cephalosporins in the past    Antimicrobials this admission: Doxy 10/4 x1 Azithromycin 10/4 >> 10/5 Ceftriaxone 10/6 >> 10/6 Cefepime 10/6 >>  Microbiology results: 10/4 Tracheal aspirate: moderate serratia marcescens, moderate pseudomonas aeruginosa, susceptibilities for PSA pending 10/4 BCx: No growth x 2 days   10/4 MRSA PCR: negative 10/4 COVID 19: negative  Thank you for allowing pharmacy to be a part of this patient's care.  [employer] Resident 03/10/2019 4:37 PM

## 2019-03-10 NOTE — Progress Notes (Signed)
[place] [facility] [place] NAME: Michelle [name]    MR#:  [id number]  DATE OF BIRTH:  07-31-1960  SUBJECTIVE:   Patient states she is still feeling pretty anxious this morning.  She is still having hard time breathing.  She did not sleep well last night.  She did receive a couple of doses of IV Ativan, but states that this made her even more anxious.  REVIEW OF SYSTEMS:  Review of Systems  Constitutional: Negative for chills and fever.  HENT: Negative for congestion and sore throat.   Eyes: Negative for blurred vision and double vision.  Respiratory: Positive for cough, shortness of breath and wheezing.   Cardiovascular: Negative for chest pain and palpitations.  Gastrointestinal: Negative for nausea and vomiting.  Genitourinary: Negative for dysuria and urgency.  Musculoskeletal: Negative for back pain and neck pain.  Neurological: Negative for dizziness and headaches.  Psychiatric/Behavioral: Negative for depression. The patient is nervous/anxious.    DRUG ALLERGIES:   Allergies  Allergen Reactions  . Amoxicillin     Patient has tolerated cephalosporins in the past   VITALS:  Blood pressure (!) 160/98, pulse 96, temperature 98.4 F (36.9 C), temperature source Oral, resp. rate 11, height 5' (1.524 m), weight 87.1 kg, last menstrual period 03/06/2005, SpO2 100 %. PHYSICAL EXAMINATION:  Physical Exam  GENERAL:  Laying in the bed with no acute distress.  In mild respiratory distress. HEENT: Head atraumatic, normocephalic. Pupils equal, round, reactive to light and accommodation. No scleral icterus. Extraocular muscles intact. Oropharynx and nasopharynx clear.  NECK:  Supple, no jugular venous distention. No thyroid enlargement. +trach LUNGS: + Diminished breath sounds throughout all lung fields.  No wheezing. Mild to moderately increased work of breathing.  CARDIOVASCULAR: RRR, S1, S2 normal. No murmurs, rubs, or gallops.  ABDOMEN: Soft,  nontender, nondistended. Bowel sounds present. +PEG EXTREMITIES: No pedal edema, cyanosis, or clubbing.  NEUROLOGIC: CN 2-12 intact, no focal deficits. 5/5 muscle strength throughout all extremities. Sensation intact throughout. Gait not checked.  PSYCHIATRIC: The patient is alert and oriented x 3.  SKIN: No obvious rash, lesion, or ulcer.  LABORATORY PANEL:  Female CBC Recent Labs  Lab 03/09/19 0626  WBC 17.8*  HGB 10.7*  HCT 37.3  PLT 328   ------------------------------------------------------------------------------------------------------------------ Chemistries  Recent Labs  Lab 03/08/19 1009 03/09/19 0626  NA 139 139  K 4.2 4.7  CL 106 101  CO2 22 25  GLUCOSE 176* 248*  BUN 8 15  CREATININE 0.66 0.80  CALCIUM 9.1 8.6*  MG  --  1.9  AST 31  --   ALT 21  --   ALKPHOS 122  --   BILITOT 0.7  --    RADIOLOGY:  No results found. ASSESSMENT AND PLAN:   Acute on chronic hypoxic respiratory failure secondary to COPD exacerbation and CAP- patient is on 3 L trach collar at baseline.  Remains on ventilator this morning. Initially meeting sepsis criteria, but this resolved. -Sputum culture with moderate serratia marcescens   -Vent management per CCM -COVID test is negative -Continue IV Solu-Medrol -DuoNebs scheduled -Continue home inhalers -Continue ceftriaxone -RVP pending -Continue lasix 20mg  IV daily -Wean off vent as able  Type 2 diabetes- new diagnosis. A1c 6.5% this admission. -Continue SSI  Hypertension- BP has improved -May need to start daily BP med -IV Labetalol prn  Chronic diastolic CHF- no signs of volume overload. BNP normal. -Lasix 20mg  IV daily  CAD- stable, no active chest pain.  Initial troponin negative. -Continue home aspirin  Chronic pain syndrome-stable -Continue home fentanyl patch  Hyperlipidemia- stable -Continue home Zocor  Chronic anxiety with panic disorder-stable -Continue home Remeron, Paxil, Seroquel, Klonopin  All  the records are reviewed and case discussed with Care Management/Social Worker. Management plans discussed with the patient, family and they are in agreement.  CODE STATUS: DNR  TOTAL TIME TAKING CARE OF THIS PATIENT: 40 minutes.   More than 50% of the time was spent in counseling/coordination of care: YES  POSSIBLE D/C unknown, DEPENDING ON CLINICAL CONDITION.   Berna Spare Jataya Wann M.D on 03/10/2019 at 1:16 PM  Between 7am to 6pm - Pager - [phone]  After 6pm go to [email or web address] - [employer] [facility]  Office  [phone]  CC: Primary care physician; Frazier Richards, MD  Note: This dictation was prepared with Dragon dictation along with smaller phrase technology. Any transcriptional errors that result from this process are unintentional.

## 2019-03-10 NOTE — Consult Note (Signed)
PHARMACY CONSULT NOTE - FOLLOW UP  Pharmacy Consult for Electrolyte Monitoring and Replacement   Recent Labs: Potassium (mmol/L)  Date Value  03/09/2019 4.7  07/11/2014 4.6   Magnesium (mg/dL)  Date Value  03/09/2019 1.9  10/31/2013 2.6 (H)   Calcium (mg/dL)  Date Value  03/09/2019 8.6 (L)   Calcium, Total (mg/dL)  Date Value  07/11/2014 8.9   Albumin (g/dL)  Date Value  03/08/2019 4.1  07/09/2014 3.7   Phosphorus (mg/dL)  Date Value  03/09/2019 4.6  10/31/2013 4.6   Sodium (mmol/L)  Date Value  03/09/2019 139  07/11/2014 139     Assessment: 58 y/o female with history of severe COPD requiring continuous mechanical ventilation through tracheostomy, CAD, CHF, and HTN who was admitted for acute exacerbation/PNA. Patient is on oral lasix 20 mg daily.   Goal of Therapy:  K ~4 and Mg ~2  Plan:  -No electrolyte supplementation indicated today -BMP, Mg tomorrow AM  [place] Resident 03/10/2019 4:46 PM

## 2019-03-10 NOTE — Progress Notes (Addendum)
Inpatient Diabetes Program Recommendations  AACE/ADA: New Consensus Statement on Inpatient Glycemic Control (2015)  Target Ranges:  Prepandial:   less than 140 mg/dL      Peak postprandial:   less than 180 mg/dL (1-2 hours)      Critically ill patients:  140 - 180 mg/dL   Results for Michelle [name], Michelle [name] (MRN [id number]) as of 03/10/2019 07:48  Ref. Range 03/09/2019 11:33 03/09/2019 16:11 03/09/2019 19:47 03/10/2019 04:51 03/10/2019 07:43  Glucose-Capillary Latest Ref Range: 70 - 99 mg/dL 221 (H)  5 units NOVOLOG  214 (H)  5 units NOVOLOG  247 (H)  5 units NOVOLOG  296 (H)  8 units NOVOLOG  301 (H)   Results for Michelle [name], Michelle [name] (MRN [id number]) as of 03/10/2019 07:48  Ref. Range 02/23/2017 03:17 03/09/2019 06:26  Hemoglobin A1C Latest Ref Range: 4.8 - 5.6 % 5.3 6.7 (H)  (145 mg/dl)     Admit Resp Failure  History: Asthma, CHF, Cocaine abuse, COPD, lifelong smoking, Trach  NO History of Diabetes noted   Current Orders: Novolog Moderate Correction Scale/ SSI (0-15 units) Q4 hours     Getting Solumedrol 80 mg BID.  Remains on Vent through [place].  Vital 1.5 CAL tube feedings started yesterday at 5pm at 40cc/hr.     MD- Note that Dr. Brett Albino has identified new diagnosis of Diabetes (Hemoglobin A1c this admission is 6.7%).  CBGs have been rising since steroids and tube feedings have been started.  Please consider the following:  1. Start weight based basal insulin: Lantus 8 units Daily (0.1 units/kg based on weight of 87 kg)   2. Start Novolog Tube Feed Coverage: Novolog 4 units Q4 hours  HOLD if Tube Feeds HELD for any reason   3. Continue current Novolog SSI Q4 hours    Addendum 12:10pm- Met w/ pt today to discuss current CBG levels, hemoglobin A1c results, why we are monitoring CBGs and giving insulin, etc.  Discussed with pt that the steroids we are giving her combined with the continuous tube feeds through her PEG are likely causing a rise in her CBGs.  Pt was able to  mouth to me that she normally eats food and does not take nutrition through her PEG at home.  Uses Vent at home through her [employer] for resp support.  Explained to pt what a hemoglobin A1c level is and that her level is slightly elevated (6.7%) indicating she has been having some glucose elevations at home over the past 3 months but that her A1c is not dangerously high.  I discussed with pt that once the steroids are stopped here in the hospital that her CBGs will likely return to normal.  Do not anticipate she will need any DM meds at home to control CBGs, however, MD will make that final decision.  Pt appreciative of the info and gave me permission to call her daughter and share all of the above info.       --Will follow patient during hospitalization--  Wyn Quaker RN, MSN, CDE Diabetes Coordinator Inpatient Glycemic Control Team Team Pager: [phone] (8a-5p)

## 2019-03-11 LAB — CULTURE, RESPIRATORY W GRAM STAIN

## 2019-03-11 LAB — CBC
HCT: 32.2 % — ABNORMAL LOW (ref 36.0–46.0)
Hemoglobin: 9.4 g/dL — ABNORMAL LOW (ref 12.0–15.0)
MCH: 23.6 pg — ABNORMAL LOW (ref 26.0–34.0)
MCHC: 29.2 g/dL — ABNORMAL LOW (ref 30.0–36.0)
MCV: 80.9 fL (ref 80.0–100.0)
Platelets: 318 10*3/uL (ref 150–400)
RBC: 3.98 MIL/uL (ref 3.87–5.11)
RDW: 15.5 % (ref 11.5–15.5)
WBC: 11.9 10*3/uL — ABNORMAL HIGH (ref 4.0–10.5)
nRBC: 0 % (ref 0.0–0.2)

## 2019-03-11 LAB — GLUCOSE, CAPILLARY
Glucose-Capillary: 227 mg/dL — ABNORMAL HIGH (ref 70–99)
Glucose-Capillary: 239 mg/dL — ABNORMAL HIGH (ref 70–99)
Glucose-Capillary: 249 mg/dL — ABNORMAL HIGH (ref 70–99)
Glucose-Capillary: 265 mg/dL — ABNORMAL HIGH (ref 70–99)
Glucose-Capillary: 271 mg/dL — ABNORMAL HIGH (ref 70–99)
Glucose-Capillary: 315 mg/dL — ABNORMAL HIGH (ref 70–99)
Glucose-Capillary: 340 mg/dL — ABNORMAL HIGH (ref 70–99)

## 2019-03-11 LAB — BASIC METABOLIC PANEL
Anion gap: 8 (ref 5–15)
BUN: 52 mg/dL — ABNORMAL HIGH (ref 6–20)
CO2: 29 mmol/L (ref 22–32)
Calcium: 8.6 mg/dL — ABNORMAL LOW (ref 8.9–10.3)
Chloride: 102 mmol/L (ref 98–111)
Creatinine, Ser: 0.94 mg/dL (ref 0.44–1.00)
GFR calc Af Amer: >60 mL/min (ref >60)
GFR calc non Af Amer: >60 mL/min (ref >60)
Glucose, Bld: 267 mg/dL — ABNORMAL HIGH (ref 70–99)
Potassium: 4.2 mmol/L (ref 3.5–5.1)
Sodium: 139 mmol/L (ref 135–145)

## 2019-03-11 LAB — MAGNESIUM: Magnesium: 2.7 mg/dL — ABNORMAL HIGH (ref 1.7–2.4)

## 2019-03-11 LAB — PHOSPHORUS: Phosphorus: 1.8 mg/dL — ABNORMAL LOW (ref 2.5–4.6)

## 2019-03-11 LAB — PROCALCITONIN: Procalcitonin: <0.1 ng/mL

## 2019-03-11 MED ORDER — SODIUM PHOSPHATES 45 MMOLE/15ML IV SOLN
30.0000 mmol | Freq: Once | INTRAVENOUS | Status: AC
  Administered 2019-03-11: 30 mmol via INTRAVENOUS
  Filled 2019-03-11: qty 10

## 2019-03-11 MED ORDER — CIPROFLOXACIN IN D5W 400 MG/200ML IV SOLN
400.0000 mg | Freq: Three times a day (TID) | INTRAVENOUS | Status: DC
  Administered 2019-03-11 – 2019-03-12 (×3): 400 mg via INTRAVENOUS
  Filled 2019-03-11 (×6): qty 200

## 2019-03-11 MED ORDER — INSULIN ASPART 100 UNIT/ML ~~LOC~~ SOLN
3.0000 [IU] | SUBCUTANEOUS | Status: DC
  Administered 2019-03-11 – 2019-03-12 (×7): 3 [IU] via SUBCUTANEOUS
  Administered 2019-03-12: 100 [IU] via SUBCUTANEOUS
  Administered 2019-03-12 – 2019-03-14 (×9): 3 [IU] via SUBCUTANEOUS
  Filled 2019-03-11 (×17): qty 1

## 2019-03-11 MED ORDER — INSULIN GLARGINE 100 UNIT/ML ~~LOC~~ SOLN
10.0000 [IU] | Freq: Every day | SUBCUTANEOUS | Status: DC
  Administered 2019-03-12 – 2019-03-14 (×3): 10 [IU] via SUBCUTANEOUS
  Filled 2019-03-11 (×3): qty 0.1

## 2019-03-11 MED ORDER — INSULIN GLARGINE 100 UNIT/ML ~~LOC~~ SOLN
3.0000 [IU] | Freq: Once | SUBCUTANEOUS | Status: AC
  Administered 2019-03-11: 17:00:00 3 [IU] via SUBCUTANEOUS
  Filled 2019-03-11: qty 0.03

## 2019-03-11 NOTE — Consult Note (Signed)
PHARMACY CONSULT NOTE - FOLLOW UP  Pharmacy Consult for Electrolyte Monitoring and Replacement   Recent Labs: Potassium (mmol/L)  Date Value  03/11/2019 4.2  07/11/2014 4.6   Magnesium (mg/dL)  Date Value  03/11/2019 2.7 (H)  10/31/2013 2.6 (H)   Calcium (mg/dL)  Date Value  03/11/2019 8.6 (L)   Calcium, Total (mg/dL)  Date Value  07/11/2014 8.9   Albumin (g/dL)  Date Value  03/08/2019 4.1  07/09/2014 3.7   Phosphorus (mg/dL)  Date Value  03/11/2019 1.8 (L)  10/31/2013 4.6   Sodium (mmol/L)  Date Value  03/11/2019 139  07/11/2014 139     Assessment: 58 y/o female with history of severe COPD requiring continuous mechanical ventilation through tracheostomy, CAD, CHF, and HTN who was admitted for acute exacerbation/PNA. Patient is on IV lasix 20 mg daily.   Patient is hypermagnesemic and hypophosphatemic today. Sodium and potassium WNL. Scr trending up. Bicarb trending up. Per chart, patient appears is having regular bowel movements.   Goal of Therapy:  K ~4 and Mg ~2  Plan:  -Sodium phosphate 30 mmol IV x1 -BMP, Mg, Phos tomorrow AM  [place] Resident 03/11/2019 2:06 PM

## 2019-03-11 NOTE — Consult Note (Addendum)
Pharmacy Antibiotic Note  Michelle [name] is a 58 y.o. female admitted on 03/08/2019 with pneumonia. Patient admitted for acute on chronic hypoxic respiratory failure secondary to COPD exacerbation/PNA. She was initially started on azithromycin monotherapy which was converted to ceftriaxone after Serratia was noted to be growing in tracheal aspirate culture. Ceftriaxone was switch to cefepime after culture update showed additional PSA. Pseudomonas aeruginosa susceptibilities resulted indicating intermediate susceptibility to cefepime. Therapy altered to ciprofloxacin monotherapy. Pharmacy has been consulted for ciprofloxacin dosing.  Patient with trach on ventilator. Patient with PCT of <0.10 and WBC of 11.9, values trending down. Afebrile last 24h with recent temp of 98.35F.   Plan: Initiate Ciprofloxacin IVPB 400 mg q8hr for 7 days. -Q8h dosing chosen due to MIC of 1 for pseudomonas aeruginosa  Continue to monitor cultures, renal function, and clinical status for signs of improvement with antibiotics.   Height: 5' (152.4 cm) Weight: 192 lb 0.3 oz (87.1 kg) IBW/kg (Calculated) : 45.5  Temp (24hrs), Avg:98.2 F (36.8 C), Min:97.5 F (36.4 C), Max:99 F (37.2 C)  Recent Labs  Lab 03/08/19 1009 03/08/19 1526 03/09/19 0626 03/11/19 0532  WBC 13.0*  --  17.8* 11.9*  CREATININE 0.66  --  0.80 0.94  LATICACIDVEN  --  1.5  --   --     Estimated Creatinine Clearance: 64.7 mL/min (by C-G formula based on SCr of 0.94 mg/dL).    Allergies  Allergen Reactions  . Amoxicillin     Patient has tolerated cephalosporins in the past    Antimicrobials this admission: Doxy 10/4 x1 Azithromycin 10/4 >> 10/5 Ceftriaxone 10/6 >> 10/6 Cefepime 10/6 >>10/7 Ciprofloxacin 10/7 >>  Microbiology results:  10/4 Tracheal aspirate: moderate serratia marcescens, moderate pseudomonas aeruginosa (intermediate to cefepime)-- both bugs susceptible to cipro 10/4 BCx: No growth x 3 days   10/4 MRSA PCR:  negative 10/4 COVID 19: negative  Thank you for allowing pharmacy to be a part of this patient's care.  [place] Candidate 2021 03/11/2019 2:24 PM

## 2019-03-11 NOTE — Progress Notes (Signed)
Inpatient Diabetes Program Recommendations  AACE/ADA: New Consensus Statement on Inpatient Glycemic Control (2015)  Target Ranges:  Prepandial:   less than 140 mg/dL      Peak postprandial:   less than 180 mg/dL (1-2 hours)      Critically ill patients:  140 - 180 mg/dL   Results for Michelle [name], Michelle [name] (MRN [id number]) as of 03/11/2019 08:18  Ref. Range 03/10/2019 00:21 03/10/2019 04:51 03/10/2019 07:43 03/10/2019 11:51 03/10/2019 16:43 03/10/2019 19:25  Glucose-Capillary Latest Ref Range: 70 - 99 mg/dL 243 (H)  5 units NOVOLOG  296 (H)  8 units NOVOLOG  301 (H)  11 units NOVOLOG  293 (H)  8 units NOVOLOG +  5 units LANTUS  212 (H)  5 units NOVOLOG  234 (H)  5 units NOVOLOG    Results for Michelle [name], Michelle [name] (MRN [id number]) as of 03/11/2019 08:18  Ref. Range 03/11/2019 00:24 03/11/2019 04:03 03/11/2019 07:48  Glucose-Capillary Latest Ref Range: 70 - 99 mg/dL 239 (H)  5 units NOVOLOG  265 (H)  8 units NOVOLOG  271 (H)  8 units NOVOLOG     Admit Resp Failure  History: Asthma, CHF, Cocaine abuse, COPD, lifelong smoking, Trach  NO History of Diabetes noted   Current Orders: Novolog Moderate Correction Scale/ SSI (0-15 units) Q4 hours     Lantus 5 units Daily     Getting Solumedrol 40 mg BID.  Remains on Vent through [facility].  Vital 1.5 CAL tube feedings started yesterday at 5pm at 40cc/hr.     MD- Note that Dr. Brett Albino has identified new diagnosis of Diabetes (Hemoglobin A1c this admission is 6.7%).  CBGs have been rising since steroids and tube feedings have been started.   Please consider the following:  1. Increase Lantus to 10 units Daily   2. Start Novolog Tube Feed Coverage: Novolog 4 units Q4 hours  HOLD if Tube Feeds HELD for any reason    --Will follow patient during hospitalization--  Wyn Quaker RN, MSN, CDE Diabetes Coordinator Inpatient Glycemic Control Team Team Pager: [phone] (8a-5p)

## 2019-03-11 NOTE — Progress Notes (Signed)
[facility] [place] NAME: Michelle [name]    MR#:  [id number]  DATE OF BIRTH:  02/12/1961  SUBJECTIVE:   Patient states she is feeling much better this morning.  Her shortness of breath is improved from yesterday.  She also feels like her anxiety has improved.  REVIEW OF SYSTEMS:  Review of Systems  Constitutional: Negative for chills and fever.  HENT: Negative for congestion and sore throat.   Eyes: Negative for blurred vision and double vision.  Respiratory: Positive for cough, shortness of breath and wheezing.   Cardiovascular: Negative for chest pain and palpitations.  Gastrointestinal: Negative for nausea and vomiting.  Genitourinary: Negative for dysuria and urgency.  Musculoskeletal: Negative for back pain and neck pain.  Neurological: Negative for dizziness and headaches.  Psychiatric/Behavioral: Negative for depression.   DRUG ALLERGIES:   Allergies  Allergen Reactions  . Amoxicillin     Patient has tolerated cephalosporins in the past   VITALS:  Blood pressure 109/78, pulse 81, temperature 98.4 F (36.9 C), temperature source Oral, resp. rate 16, height 5' (1.524 m), weight 87.1 kg, last menstrual period 03/06/2005, SpO2 100 %. PHYSICAL EXAMINATION:  Physical Exam  GENERAL:  Laying in the bed with no acute distress.  In mild respiratory distress. HEENT: Head atraumatic, normocephalic. Pupils equal, round, reactive to light and accommodation. No scleral icterus. Extraocular muscles intact. Oropharynx and nasopharynx clear.  NECK:  Supple, no jugular venous distention. No thyroid enlargement. +trach LUNGS: + Diminished breath sounds throughout all lung fields.  No wheezing. Normal work of breathing. CARDIOVASCULAR: RRR, S1, S2 normal. No murmurs, rubs, or gallops.  ABDOMEN: Soft, nontender, nondistended. Bowel sounds present. +PEG EXTREMITIES: No pedal edema, cyanosis, or clubbing.  NEUROLOGIC: CN 2-12 intact, no focal  deficits. 5/5 muscle strength throughout all extremities. Sensation intact throughout. Gait not checked.  PSYCHIATRIC: The patient is alert and oriented x 3.  SKIN: No obvious rash, lesion, or ulcer.  LABORATORY PANEL:  Female CBC Recent Labs  Lab 03/11/19 0532  WBC 11.9*  HGB 9.4*  HCT 32.2*  PLT 318   ------------------------------------------------------------------------------------------------------------------ Chemistries  Recent Labs  Lab 03/08/19 1009  03/11/19 0532  NA 139   < > 139  K 4.2   < > 4.2  CL 106   < > 102  CO2 22   < > 29  GLUCOSE 176*   < > 267*  BUN 8   < > 52*  CREATININE 0.66   < > 0.94  CALCIUM 9.1   < > 8.6*  MG  --    < > 2.7*  AST 31  --   --   ALT 21  --   --   ALKPHOS 122  --   --   BILITOT 0.7  --   --    < > = values in this interval not displayed.   RADIOLOGY:  No results found. ASSESSMENT AND PLAN:   Acute on chronic hypoxic respiratory failure secondary to COPD exacerbation and CAP- patient is on 3 L trach collar at baseline.  Remains on ventilator this morning. Initially meeting sepsis criteria, but this resolved. -Sputum culture with serratia and pseudomonas -Switched from ceftriaxone to cefepime -Vent management per CCM -COVID test is negative -Continue IV Solu-Medrol -DuoNebs scheduled -Continue home inhalers -Continue lasix 20mg  IV daily -Wean off vent as able  Type 2 diabetes- new diagnosis. A1c 6.7% this admission. -Continue SSI  Hypertension- normal BP today. -IV Labetalol  prn  Chronic diastolic CHF- no signs of volume overload. BNP normal. -Lasix 20mg  IV daily  CAD- stable, no active chest pain. Initial troponin negative. -Continue home aspirin  Chronic pain syndrome-stable -Continue home fentanyl patch  Hyperlipidemia- stable -Continue home Zocor  Chronic anxiety with panic disorder-stable -Continue home Remeron, Paxil, Seroquel, Klonopin  All the records are reviewed and case discussed with Care  Management/Social Worker. Management plans discussed with the patient, family and they are in agreement.  CODE STATUS: DNR  TOTAL TIME TAKING CARE OF THIS PATIENT: 38 minutes.   More than 50% of the time was spent in counseling/coordination of care: YES  POSSIBLE D/C unknown, DEPENDING ON CLINICAL CONDITION.   Berna Spare  M.D on 03/11/2019 at 1:59 PM  Between 7am to 6pm - Pager [phone]  After 6pm go to [email or web address] - Technical brewer [facility]  Office  [phone]  CC: Primary care physician; Frazier Richards, MD  Note: This dictation was prepared with Dragon dictation along with smaller phrase technology. Any transcriptional errors that result from this process are unintentional.

## 2019-03-11 NOTE — Progress Notes (Addendum)
Follow up - Critical Care Medicine Note  Patient Details:    Michelle [name] is an 58 y.o. female with known end-stage COPD, chronically ventilator dependent, admitted on 10/4 due to acute on chronic respiratory failure after noting increasing shortness of breath for 2 weeks prior to admission. Patient also noted increasing secretions through her tracheostomy. Patient is chronically on a Trilogy vent at home.  Lines, Airways, Drains: Gastrostomy/Enterostomy Gastrostomy 20 Fr. LUQ (Active)     External Urinary Catheter (Active)  Collection Container Dedicated Suction Canister 03/09/19 2150  Securement Method Other (Comment) 03/09/19 2150  Site Assessment Clean;Intact;Dry 03/09/19 2150  Output (mL) 900 mL 03/09/19 2150    Anti-infectives:  Anti-infectives (From admission, onward)   Start     Dose/Rate Route Frequency Ordered Stop   03/11/19 1200  ciprofloxacin (CIPRO) IVPB 400 mg     400 mg 200 mL/hr over 60 Minutes Intravenous Every 8 hours 03/11/19 1155     03/10/19 1645  ceFEPIme (MAXIPIME) 1 g in sodium chloride 0.9 % 100 mL IVPB  Status:  Discontinued     1 g 200 mL/hr over 30 Minutes Intravenous Every 8 hours 03/10/19 1637 03/11/19 1155   03/10/19 1045  cefTRIAXone (ROCEPHIN) 2 g in sodium chloride 0.9 % 100 mL IVPB  Status:  Discontinued     2 g 200 mL/hr over 30 Minutes Intravenous Daily 03/10/19 1041 03/10/19 1637   03/08/19 1800  azithromycin (ZITHROMAX) 500 mg in sodium chloride 0.9 % 250 mL IVPB  Status:  Discontinued     500 mg 250 mL/hr over 60 Minutes Intravenous Every 24 hours 03/08/19 1652 03/10/19 1041   03/08/19 1515  doxycycline (VIBRA-TABS) tablet 100 mg  Status:  Discontinued     100 mg Per Tube Every 12 hours 03/08/19 1512 03/08/19 1652      Microbiology: Results for orders placed or performed during the hospital encounter of 03/08/19  SARS Coronavirus 2 [facility] order, Performed in [facility] hospital lab) Nasopharyngeal Nasopharyngeal Swab     Status:  None   Collection Time: 03/08/19 10:09 AM   Specimen: Nasopharyngeal Swab  Result Value Ref Range Status   SARS Coronavirus 2 NEGATIVE NEGATIVE Final    Comment: (NOTE) If result is NEGATIVE SARS-CoV-2 target nucleic acids are NOT DETECTED. The SARS-CoV-2 RNA is generally detectable in upper and lower  respiratory specimens during the acute phase of infection. The lowest  concentration of SARS-CoV-2 viral copies this assay can detect is 250  copies / mL. A negative result does not preclude SARS-CoV-2 infection  and should not be used as the sole basis for treatment or other  patient management decisions.  A negative result may occur with  improper specimen collection / handling, submission of specimen other  than nasopharyngeal swab, presence of viral mutation(s) within the  areas targeted by this assay, and inadequate number of viral copies  (<250 copies / mL). A negative result must be combined with clinical  observations, patient history, and epidemiological information. If result is POSITIVE SARS-CoV-2 target nucleic acids are DETECTED. The SARS-CoV-2 RNA is generally detectable in upper and lower  respiratory specimens dur ing the acute phase of infection.  Positive  results are indicative of active infection with SARS-CoV-2.  Clinical  correlation with patient history and other diagnostic information is  necessary to determine patient infection status.  Positive results do  not rule out bacterial infection or co-infection with other viruses. If result is PRESUMPTIVE POSTIVE SARS-CoV-2 nucleic acids MAY BE PRESENT.  A presumptive positive result was obtained on the submitted specimen  and confirmed on repeat testing.  While 2019 novel coronavirus  (SARS-CoV-2) nucleic acids may be present in the submitted sample  additional confirmatory testing may be necessary for epidemiological  and / or clinical management purposes  to differentiate between  SARS-CoV-2 and other  Sarbecovirus currently known to infect humans.  If clinically indicated additional testing with an alternate test  methodology [phone]) is advised. The SARS-CoV-2 RNA is generally  detectable in upper and lower respiratory sp ecimens during the acute  phase of infection. The expected result is Negative. Fact Sheet for Patients:  StrictlyIdeas.no Fact Sheet for Healthcare Providers: BankingDealers.co.za This test is not yet approved or cleared by the [place] FDA and has been authorized for detection and/or diagnosis of SARS-CoV-2 by FDA under an Emergency Use Authorization (EUA).  This EUA will remain in effect (meaning this test can be used) for the duration of the COVID-19 declaration under Section 564(b)(1) of the Act, 21 U.S.C. section 360bbb-3(b)(1), unless the authorization is terminated or revoked sooner. Performed at [facility], [address] [place], [place] [address]   Culture, respiratory (non-expectorated)     Status: None   Collection Time: 03/08/19 10:10 AM   Specimen: Tracheal Aspirate; Respiratory  Result Value Ref Range Status   Specimen Description   Final    TRACHEAL ASPIRATE Performed at [facility], [address] [place], [place] [address]    Special Requests   Final    NONE Performed at [facility], [address] [place], [place] [address]    Gram Stain   Final    RARE WBC PRESENT, PREDOMINANTLY PMN FEW GRAM POSITIVE RODS RARE GRAM POSITIVE COCCI RARE YEAST Performed at [facility], [place] [address], [place], [place] [address]    Culture   Final    MODERATE SERRATIA MARCESCENS MODERATE PSEUDOMONAS AERUGINOSA    Report Status 03/11/2019 FINAL  Final   Organism ID, Bacteria SERRATIA MARCESCENS  Final   Organism ID, Bacteria PSEUDOMONAS AERUGINOSA  Final      Susceptibility   Pseudomonas aeruginosa - MIC*    CEFTAZIDIME 4 SENSITIVE Sensitive      CIPROFLOXACIN 1 SENSITIVE Sensitive     GENTAMICIN 4 SENSITIVE Sensitive     IMIPENEM >=16 RESISTANT Resistant     PIP/TAZO 8 SENSITIVE Sensitive     CEFEPIME 16 INTERMEDIATE Intermediate     * MODERATE PSEUDOMONAS AERUGINOSA   Serratia marcescens - MIC*    CEFAZOLIN >=64 RESISTANT Resistant     CEFEPIME <=1 SENSITIVE Sensitive     CEFTAZIDIME <=1 SENSITIVE Sensitive     CEFTRIAXONE <=1 SENSITIVE Sensitive     CIPROFLOXACIN <=0.25 SENSITIVE Sensitive     GENTAMICIN <=1 SENSITIVE Sensitive     TRIMETH/SULFA <=20 SENSITIVE Sensitive     * MODERATE SERRATIA MARCESCENS  MRSA PCR Screening     Status: None   Collection Time: 03/08/19  3:13 PM   Specimen: Nasopharyngeal  Result Value Ref Range Status   MRSA by PCR NEGATIVE NEGATIVE Final    Comment:        The GeneXpert MRSA Assay (FDA approved for NASAL specimens only), is one component of a comprehensive MRSA colonization surveillance program. It is not intended to diagnose MRSA infection nor to guide or monitor treatment for MRSA infections. Performed at [facility], [address] [place], [place] [address]   CULTURE, BLOOD (ROUTINE X 2) w Reflex to  ID Panel     Status: None (Preliminary result)   Collection Time: 03/08/19  3:50 PM   Specimen: BLOOD  Result Value Ref Range Status   Specimen Description BLOOD LEFT ANTECUBITAL  Final   Special Requests   Final    BOTTLES DRAWN AEROBIC AND ANAEROBIC Blood Culture adequate volume   Culture   Final    NO GROWTH 3 DAYS Performed at [facility], [address] [place], [place] [address]    Report Status PENDING  Incomplete  CULTURE, BLOOD (ROUTINE X 2) w Reflex to ID Panel     Status: None (Preliminary result)   Collection Time: 03/08/19  3:50 PM   Specimen: BLOOD  Result Value Ref Range Status   Specimen Description BLOOD BLOOD RIGHT HAND  Final   Special Requests   Final    BOTTLES DRAWN AEROBIC AND ANAEROBIC Blood Culture results may not be optimal  due to an inadequate volume of blood received in culture bottles   Culture   Final    NO GROWTH 3 DAYS Performed at [facility], [address], [place], [place] [address]    Report Status PENDING  Incomplete    Best Practice/Protocols:  VTE Prophylaxis: Lovenox (prophylaxtic dose) GI Prophylaxis: Antihistamine   Events: 10/4 admitted for acute on chronic respiratory failure 10/5 Serratia in sputum, gram-negative coverage broadened 10/6 Pseudomonas @ sputum as well 10/6 switched back to her home vent 10/7 Pseudomonas only intermediate to cefepime, switched to Cipro  Studies: Dg Chest Port 1 View  Result Date: 03/09/2019 CLINICAL DATA:  Acute respiratory failure EXAM: PORTABLE CHEST 1 VIEW COMPARISON:  03/08/2019 FINDINGS: The tracheostomy tube terminates above the carina. There are streaky bibasilar airspace opacities with blunting of the left costophrenic angle. The opacity at the left lung base has significantly improved from prior study. There is no pneumothorax. There is no acute osseous abnormality. IMPRESSION: Improving bibasilar airspace opacities and trace left pleural effusion. Electronically Signed   By: Constance Holster M.D.   On: 03/09/2019 07:52   Dg Chest Portable 1 View  Result Date: 03/08/2019 CLINICAL DATA:  Shortness of breath EXAM: PORTABLE CHEST 1 VIEW COMPARISON:  10/22/2018 FINDINGS: Tracheostomy tube in satisfactory position. The heart size and mediastinal contours are within normal limits. Both lungs are clear. There is mild bibasilar atelectasis. The visualized skeletal structures are unremarkable. IMPRESSION: No active disease. Electronically Signed   By: Kathreen Devoid   On: 03/08/2019 11:08    Consults: Treatment Team:  Pccm, Ander Gaster, MD   Subjective:    Overnight Issues: No overnight issues.  Rested quietly.  Comfortable on home vent.  Objective:  Vital signs for last 24 hours: Temp:  [98 F (36.7 C)-98.4 F (36.9 C)] 98.2 F  (36.8 C) (10/07 1600) Pulse Rate:  [70-99] 83 (10/07 1800) Resp:  [16-21] 16 (10/07 1800) BP: (89-146)/(58-131) 110/69 (10/07 1600) SpO2:  [99 %-100 %] 100 % (10/07 2102) FiO2 (%):  [24 %-30 %] 28 % (10/07 2103)  Hemodynamic parameters for last 24 hours:    Intake/Output from previous day: 10/06 0701 - 10/07 0700 In: 1659.9 [NG/GT:1360; IV Piggyback:299.9] Out: 1250 [Urine:1250]  Intake/Output this shift: No intake/output data recorded.  Vent settings for last 24 hours: Vent Mode: AC FiO2 (%):  [24 %-30 %] 28 % Set Rate:  [16 bmp] 16 bmp Vt Set:  [450 mL] 450 mL PEEP:  [5 cmH20] 5 cmH20  Physical Exam:  GENERAL:Obese woman, no distress, comfortable. Tracheostomy in place, ventilated.  HEAD: Normocephalic, atraumatic.  EYES: PEERL. No scleral icterus.  MOUTH:No thrush. NECK: Supple.No JVD.Trach in place, dressing intact. PULMONARY:Coarse lung sounds,+rhonchi, no wheezes CARDIOVASCULAR: S1 and S2. Regular rate and rhythm. No murmurs, rubs, or gallops.  GASTROINTESTINAL: Soft, nontender, non-distended. No masses. Positive bowel sounds. No hepatosplenomegaly.  MUSCULOSKELETAL: No swelling, clubbing, or edema.  NEUROLOGIC:No distress noted. Follows commands. No focal deficits. SKIN:intact,warm,dry  Assessment/Plan:  1. Acute on chronic respiratory failure, chronically ventilator dependent:Has Serratia marcescens and Pseudomonas aeruginosa in the sputum. Final sensitivities show that Pseudomonas is only intermediate to cefepime see below.  Stable on current ventilator settings. Back on her home vent.  2. End-stage COPDwith exacerbation due to bacterial pneumonia :Last known FEV1 was 0.66 L in 2017. Continue bronchodilators, continue steroids due to bronchospasm on admission, decreased dose.  3.Gram-negative pneumonia: Serratia marcescens and Pseudomonas, patient on cefepime but Pseudomonas is only intermediately sensitive to cefepime therefore switched to Cipro  according to sensitivities.  4. Anxiety disorder:Continue Remeron and Ativan as per home management.  5. Diabetes mellitus type 2: ICU hyperglycemia protocol.  Decreased steroids.  6. Nutrition: Continue tube feeds.  7.  Placement issues: Family apparently is overwhelmed with the care of the patient at home, they are not satisfied with the hours from the home health nurses.  Family apparently interested in placing the patient in long-term care.  Case management assisting with these issues, appreciate their input.    LOS: 3 days   Additional comments: Discussed at multidisciplinary rounds  Critical Care Total Time*: 35 Minutes  C. Derrill Kay, MD [facility] 03/11/2019  *Care during the described time interval was provided by me and/or other providers on the critical care team.  I have reviewed this patient's available data, including medical history, events of note, physical examination and test results as part of my evaluation.

## 2019-03-12 ENCOUNTER — Telehealth: Payer: Self-pay | Admitting: Primary Care

## 2019-03-12 LAB — MAGNESIUM: Magnesium: 2.6 mg/dL — ABNORMAL HIGH (ref 1.7–2.4)

## 2019-03-12 LAB — GLUCOSE, CAPILLARY
Glucose-Capillary: 127 mg/dL — ABNORMAL HIGH (ref 70–99)
Glucose-Capillary: 213 mg/dL — ABNORMAL HIGH (ref 70–99)
Glucose-Capillary: 215 mg/dL — ABNORMAL HIGH (ref 70–99)
Glucose-Capillary: 239 mg/dL — ABNORMAL HIGH (ref 70–99)
Glucose-Capillary: 256 mg/dL — ABNORMAL HIGH (ref 70–99)
Glucose-Capillary: 286 mg/dL — ABNORMAL HIGH (ref 70–99)
Glucose-Capillary: 292 mg/dL — ABNORMAL HIGH (ref 70–99)

## 2019-03-12 LAB — BASIC METABOLIC PANEL
Anion gap: 9 (ref 5–15)
BUN: 40 mg/dL — ABNORMAL HIGH (ref 6–20)
CO2: 28 mmol/L (ref 22–32)
Calcium: 8.5 mg/dL — ABNORMAL LOW (ref 8.9–10.3)
Chloride: 99 mmol/L (ref 98–111)
Creatinine, Ser: 0.78 mg/dL (ref 0.44–1.00)
GFR calc Af Amer: >60 mL/min (ref >60)
GFR calc non Af Amer: >60 mL/min (ref >60)
Glucose, Bld: 301 mg/dL — ABNORMAL HIGH (ref 70–99)
Potassium: 3.6 mmol/L (ref 3.5–5.1)
Sodium: 136 mmol/L (ref 135–145)

## 2019-03-12 LAB — CBC
HCT: 32.8 % — ABNORMAL LOW (ref 36.0–46.0)
Hemoglobin: 9.9 g/dL — ABNORMAL LOW (ref 12.0–15.0)
MCH: 24 pg — ABNORMAL LOW (ref 26.0–34.0)
MCHC: 30.2 g/dL (ref 30.0–36.0)
MCV: 79.6 fL — ABNORMAL LOW (ref 80.0–100.0)
Platelets: 316 10*3/uL (ref 150–400)
RBC: 4.12 MIL/uL (ref 3.87–5.11)
RDW: 15.6 % — ABNORMAL HIGH (ref 11.5–15.5)
WBC: 9.6 10*3/uL (ref 4.0–10.5)
nRBC: 0 % (ref 0.0–0.2)

## 2019-03-12 LAB — PHOSPHORUS: Phosphorus: 2.9 mg/dL (ref 2.5–4.6)

## 2019-03-12 MED ORDER — PREDNISONE 20 MG PO TABS
20.0000 mg | ORAL_TABLET | Freq: Every day | ORAL | Status: DC
  Administered 2019-03-13 – 2019-03-14 (×2): 20 mg
  Filled 2019-03-12 (×2): qty 1

## 2019-03-12 MED ORDER — CIPROFLOXACIN HCL 500 MG PO TABS
500.0000 mg | ORAL_TABLET | Freq: Three times a day (TID) | ORAL | Status: DC
  Administered 2019-03-12: 14:00:00 500 mg
  Filled 2019-03-12 (×2): qty 1

## 2019-03-12 MED ORDER — CIPROFLOXACIN HCL 500 MG PO TABS
750.0000 mg | ORAL_TABLET | Freq: Two times a day (BID) | ORAL | Status: DC
  Administered 2019-03-12 – 2019-03-14 (×4): 750 mg
  Filled 2019-03-12 (×5): qty 1.5

## 2019-03-12 MED ORDER — POLYETHYLENE GLYCOL 3350 17 G PO PACK: 17.0000 g | PACK | Freq: Every day | ORAL | Status: DC | PRN

## 2019-03-12 MED ORDER — POTASSIUM CHLORIDE 20 MEQ PO PACK
40.0000 meq | PACK | ORAL | Status: AC
  Administered 2019-03-12 (×2): 40 meq via ORAL
  Filled 2019-03-12 (×2): qty 2

## 2019-03-12 NOTE — Progress Notes (Signed)
Follow up - Critical Care Medicine Note  Patient Details:    Michelle [name] is an 58 y.o. female with known end-stage COPD, chronically ventilator dependent, admitted on 10/4 due to acute on chronic respiratory failure after noting increasing shortness of breath for 2 weeks prior to admission. Patient also noted increasing secretions through her tracheostomy. Patient is chronically on a Trilogy vent at home.  Lines, Airways, Drains: Chronic trach Gastrostomy/Enterostomy Gastrostomy 20 Fr. LUQ (Active)     External Urinary Catheter (Active)  Collection Container Dedicated Suction Canister 03/09/19 2150  Securement Method Other (Comment) 03/09/19 2150  Site Assessment Clean;Intact;Dry 03/09/19 2150  Output (mL) 900 mL 03/09/19 2150    Anti-infectives:  Anti-infectives (From admission, onward)   Start     Dose/Rate Route Frequency Ordered Stop   03/12/19 2200  ciprofloxacin (CIPRO) tablet 750 mg     750 mg Per Tube Every 12 hours 03/12/19 1550     03/12/19 1400  ciprofloxacin (CIPRO) tablet 500 mg  Status:  Discontinued     500 mg Per Tube Every 8 hours 03/12/19 1025 03/12/19 1550   03/11/19 1200  ciprofloxacin (CIPRO) IVPB 400 mg  Status:  Discontinued     400 mg 200 mL/hr over 60 Minutes Intravenous Every 8 hours 03/11/19 1155 03/12/19 1025   03/10/19 1645  ceFEPIme (MAXIPIME) 1 g in sodium chloride 0.9 % 100 mL IVPB  Status:  Discontinued     1 g 200 mL/hr over 30 Minutes Intravenous Every 8 hours 03/10/19 1637 03/11/19 1155   03/10/19 1045  cefTRIAXone (ROCEPHIN) 2 g in sodium chloride 0.9 % 100 mL IVPB  Status:  Discontinued     2 g 200 mL/hr over 30 Minutes Intravenous Daily 03/10/19 1041 03/10/19 1637   03/08/19 1800  azithromycin (ZITHROMAX) 500 mg in sodium chloride 0.9 % 250 mL IVPB  Status:  Discontinued     500 mg 250 mL/hr over 60 Minutes Intravenous Every 24 hours 03/08/19 1652 03/10/19 1041   03/08/19 1515  doxycycline (VIBRA-TABS) tablet 100 mg  Status:  Discontinued      100 mg Per Tube Every 12 hours 03/08/19 1512 03/08/19 1652      Microbiology: Results for orders placed or performed during the hospital encounter of 03/08/19  SARS Coronavirus 2 [facility] order, Performed in [facility] hospital lab) Nasopharyngeal Nasopharyngeal Swab     Status: None   Collection Time: 03/08/19 10:09 AM   Specimen: Nasopharyngeal Swab  Result Value Ref Range Status   SARS Coronavirus 2 NEGATIVE NEGATIVE Final    Comment: (NOTE) If result is NEGATIVE SARS-CoV-2 target nucleic acids are NOT DETECTED. The SARS-CoV-2 RNA is generally detectable in upper and lower  respiratory specimens during the acute phase of infection. The lowest  concentration of SARS-CoV-2 viral copies this assay can detect is 250  copies / mL. A negative result does not preclude SARS-CoV-2 infection  and should not be used as the sole basis for treatment or other  patient management decisions.  A negative result may occur with  improper specimen collection / handling, submission of specimen other  than nasopharyngeal swab, presence of viral mutation(s) within the  areas targeted by this assay, and inadequate number of viral copies  (<250 copies / mL). A negative result must be combined with clinical  observations, patient history, and epidemiological information. If result is POSITIVE SARS-CoV-2 target nucleic acids are DETECTED. The SARS-CoV-2 RNA is generally detectable in upper and lower  respiratory specimens dur ing the  acute phase of infection.  Positive  results are indicative of active infection with SARS-CoV-2.  Clinical  correlation with patient history and other diagnostic information is  necessary to determine patient infection status.  Positive results do  not rule out bacterial infection or co-infection with other viruses. If result is PRESUMPTIVE POSTIVE SARS-CoV-2 nucleic acids MAY BE PRESENT.   A presumptive positive result was obtained on the submitted specimen  and  confirmed on repeat testing.  While 2019 novel coronavirus  (SARS-CoV-2) nucleic acids may be present in the submitted sample  additional confirmatory testing may be necessary for epidemiological  and / or clinical management purposes  to differentiate between  SARS-CoV-2 and other Sarbecovirus currently known to infect humans.  If clinically indicated additional testing with an alternate test  methodology [phone]) is advised. The SARS-CoV-2 RNA is generally  detectable in upper and lower respiratory sp ecimens during the acute  phase of infection. The expected result is Negative. Fact Sheet for Patients:  StrictlyIdeas.no Fact Sheet for Healthcare Providers: BankingDealers.co.za This test is not yet approved or cleared by the [place] FDA and has been authorized for detection and/or diagnosis of SARS-CoV-2 by FDA under an Emergency Use Authorization (EUA).  This EUA will remain in effect (meaning this test can be used) for the duration of the COVID-19 declaration under Section 564(b)(1) of the Act, 21 U.S.C. section 360bbb-3(b)(1), unless the authorization is terminated or revoked sooner. Performed at [facility], [address] [place], [place] [address]   Culture, respiratory (non-expectorated)     Status: None   Collection Time: 03/08/19 10:10 AM   Specimen: Tracheal Aspirate; Respiratory  Result Value Ref Range Status   Specimen Description   Final    TRACHEAL ASPIRATE Performed at [facility], [address] [place], [place] [address]    Special Requests   Final    NONE Performed at [facility], [address] [place], [place] [address]    Gram Stain   Final    RARE WBC PRESENT, PREDOMINANTLY PMN FEW GRAM POSITIVE RODS RARE GRAM POSITIVE COCCI RARE YEAST Performed at [facility], [place] [address] [place], [place] [address]    Culture   Final    MODERATE SERRATIA  MARCESCENS MODERATE PSEUDOMONAS AERUGINOSA    Report Status 03/11/2019 FINAL  Final   Organism ID, Bacteria SERRATIA MARCESCENS  Final   Organism ID, Bacteria PSEUDOMONAS AERUGINOSA  Final      Susceptibility   Pseudomonas aeruginosa - MIC*    CEFTAZIDIME 4 SENSITIVE Sensitive     CIPROFLOXACIN 1 SENSITIVE Sensitive     GENTAMICIN 4 SENSITIVE Sensitive     IMIPENEM >=16 RESISTANT Resistant     PIP/TAZO 8 SENSITIVE Sensitive     CEFEPIME 16 INTERMEDIATE Intermediate     * MODERATE PSEUDOMONAS AERUGINOSA   Serratia marcescens - MIC*    CEFAZOLIN >=64 RESISTANT Resistant     CEFEPIME <=1 SENSITIVE Sensitive     CEFTAZIDIME <=1 SENSITIVE Sensitive     CEFTRIAXONE <=1 SENSITIVE Sensitive     CIPROFLOXACIN <=0.25 SENSITIVE Sensitive     GENTAMICIN <=1 SENSITIVE Sensitive     TRIMETH/SULFA <=20 SENSITIVE Sensitive     * MODERATE SERRATIA MARCESCENS  MRSA PCR Screening     Status: None   Collection Time: 03/08/19  3:13 PM   Specimen: Nasopharyngeal  Result Value Ref Range Status   MRSA by PCR NEGATIVE NEGATIVE Final    Comment:  The GeneXpert MRSA Assay (FDA approved for NASAL specimens only), is one component of a comprehensive MRSA colonization surveillance program. It is not intended to diagnose MRSA infection nor to guide or monitor treatment for MRSA infections. Performed at [facility], [address] [place], [place] [address]   CULTURE, BLOOD (ROUTINE X 2) w Reflex to ID Panel     Status: None (Preliminary result)   Collection Time: 03/08/19  3:50 PM   Specimen: BLOOD  Result Value Ref Range Status   Specimen Description BLOOD LEFT ANTECUBITAL  Final   Special Requests   Final    BOTTLES DRAWN AEROBIC AND ANAEROBIC Blood Culture adequate volume   Culture   Final    NO GROWTH 4 DAYS Performed at [facility], [address] [place], [place] [address]    Report Status PENDING  Incomplete  CULTURE, BLOOD (ROUTINE X 2) w Reflex to ID Panel      Status: None (Preliminary result)   Collection Time: 03/08/19  3:50 PM   Specimen: BLOOD  Result Value Ref Range Status   Specimen Description BLOOD BLOOD RIGHT HAND  Final   Special Requests   Final    BOTTLES DRAWN AEROBIC AND ANAEROBIC Blood Culture results may not be optimal due to an inadequate volume of blood received in culture bottles   Culture   Final    NO GROWTH 4 DAYS Performed at [facility], [address], [place], [place] [address]    Report Status PENDING  Incomplete    Best Practice/Protocols:  VTE Prophylaxis: Lovenox (prophylaxtic dose) GI Prophylaxis: Antihistamine   Events: 10/4 admitted for acute on chronic respiratory failure 10/5 Serratia in sputum, gram-negative coverage broadened 10/6 Pseudomonas @ sputum as well 10/6 switched back to her home vent 10/7 Pseudomonas only intermediate to cefepime, switched to Cipro  Studies: Dg Chest Port 1 View  Result Date: 03/09/2019 CLINICAL DATA:  Acute respiratory failure EXAM: PORTABLE CHEST 1 VIEW COMPARISON:  03/08/2019 FINDINGS: The tracheostomy tube terminates above the carina. There are streaky bibasilar airspace opacities with blunting of the left costophrenic angle. The opacity at the left lung base has significantly improved from prior study. There is no pneumothorax. There is no acute osseous abnormality. IMPRESSION: Improving bibasilar airspace opacities and trace left pleural effusion. Electronically Signed   By: Constance Holster M.D.   On: 03/09/2019 07:52   Dg Chest Portable 1 View  Result Date: 03/08/2019 CLINICAL DATA:  Shortness of breath EXAM: PORTABLE CHEST 1 VIEW COMPARISON:  10/22/2018 FINDINGS: Tracheostomy tube in satisfactory position. The heart size and mediastinal contours are within normal limits. Both lungs are clear. There is mild bibasilar atelectasis. The visualized skeletal structures are unremarkable. IMPRESSION: No active disease. Electronically Signed   By: Kathreen Devoid   On: 03/08/2019 11:08    Consults: Treatment Team:  Pccm, Ander Gaster, MD   Subjective:    Overnight Issues: No overnight issues.  Rested quietly.  Comfortable on home vent.  She is very alert and cooperative.  Objective:  Vital signs for last 24 hours: Temp:  [97.7 F (36.5 C)-98.5 F (36.9 C)] 98.3 F (36.8 C) (10/08 1200) Pulse Rate:  [65-91] 91 (10/08 1500) Resp:  [14-18] 15 (10/08 1500) BP: (104-129)/(73-109) 121/81 (10/08 1200) SpO2:  [96 %-100 %] 100 % (10/08 1500) FiO2 (%):  [24 %-28 %] 24 % (10/08 1215)  Hemodynamic parameters for last 24 hours:    Intake/Output from previous day: 10/07 0701 - 10/08 0700 In:  750.3 [IV Piggyback:750.3] Out: 1800 [Urine:1800]  Intake/Output this shift: Total I/O In: 400 [IV Piggyback:400] Out: 900 [Urine:900]  Vent settings for last 24 hours: Vent Mode: PRVC FiO2 (%):  [24 %-28 %] 24 % Set Rate:  [16 bmp] 16 bmp Vt Set:  [450 mL] 450 mL PEEP:  [5 cmH20] 5 cmH20  Physical Exam:  GENERAL:Obese woman, no distress, comfortable. Tracheostomy in place, ventilated. HEAD: Normocephalic, atraumatic.  EYES: PEERL. No scleral icterus.  MOUTH:No thrush. NECK: Supple.No JVD.Trach in place, dressing intact. PULMONARY:Coarse lung sounds,+rhonchi, no wheezes CARDIOVASCULAR: S1 and S2. Regular rate and rhythm. No murmurs, rubs, or gallops.  GASTROINTESTINAL: Soft, nontender, non-distended. No masses. Positive bowel sounds. No hepatosplenomegaly.  MUSCULOSKELETAL: No swelling, clubbing, or edema.  NEUROLOGIC:No distress noted. Follows commands. No focal deficits. SKIN:intact,warm,dry  Assessment/Plan:  1. Acute on chronic respiratory failure, chronically ventilator dependent:Has Serratia marcescens and Pseudomonas aeruginosa pneumonia.  Final sensitivities show that Pseudomonas is only intermediate to cefepime see below.  Stable on current ventilator settings. Back on her home vent.  2. End-stage COPDwith  exacerbation due to bacterial pneumonia as above:Last known FEV1 was 0.66 L in 2017. Continue bronchodilators, continue steroids due to bronchospasm on admission, decreased dose.  3.Gram-negative pneumonia: Serratia marcescens and Pseudomonas, patient on cefepime but Pseudomonas is only intermediately sensitive to cefepime therefore switched to Cipro according to sensitivities.  Cipro switched to p.o. today.  4. Anxiety disorder:Continue Remeron and Ativan as per home management.  5. Diabetes mellitus type 2: ICU hyperglycemia protocol.  Decreased steroids.  Change to prednisone 20 mg via tube today.  6. Nutrition: Continue tube feeds.  7.  Placement issues: Family apparently is overwhelmed with the care of the patient at home, they are not satisfied with the hours from the home health nurses.  Family apparently interested in placing the patient in long-term care.  Case management assisting with these issues, appreciate their input.    LOS: 4 days   Additional comments: Discussed at multidisciplinary rounds. Disposition: Home with vent or long-term care with vent.  Family still deciding.  Awaiting case management input.  Critical Care Total Time*: 35 Minutes  C. Derrill Kay, MD [place] [facility] 03/12/2019  *Care during the described time interval was provided by me and/or other providers on the critical care team.  I have reviewed this patient's available data, including medical history, events of note, physical examination and test results as part of my evaluation.

## 2019-03-12 NOTE — Consult Note (Signed)
PHARMACY CONSULT NOTE - FOLLOW UP  Pharmacy Consult for Electrolyte Monitoring and Replacement   Recent Labs: Potassium (mmol/L)  Date Value  03/12/2019 3.6  07/11/2014 4.6   Magnesium (mg/dL)  Date Value  03/12/2019 2.6 (H)  10/31/2013 2.6 (H)   Calcium (mg/dL)  Date Value  03/12/2019 8.5 (L)   Calcium, Total (mg/dL)  Date Value  07/11/2014 8.9   Albumin (g/dL)  Date Value  03/08/2019 4.1  07/09/2014 3.7   Phosphorus (mg/dL)  Date Value  03/12/2019 2.9  10/31/2013 4.6   Sodium (mmol/L)  Date Value  03/12/2019 136  07/11/2014 139     Assessment: 58 y/o female with history of severe COPD requiring continuous mechanical ventilation through tracheostomy, CAD, CHF, and HTN who was admitted for acute exacerbation/PNA. Patient is on IV lasix 20 mg daily. Patient on tube feeds via PEG tube. Glucoses have been persistently elevated and basal insulin has been titrated up with addition of scheduled rapid acting insulin q4h while on tube feeds and SSI. Per chart, patient appears is having regular bowel movements.   Corrected sodium for hyperglycemia is 141. Patient is hypermagnesemic today. Potassium trending down. Phosphate repleted after sodium phosphate supplementation yesterday. Scr and BUN trending down. UOP 1.8L yesterday. LBM 10/7.   Goal of Therapy:  K ~4 and Mg ~2  Plan:  -Potassium 40 mEq VT q4h x2 -BMP tomorrow AM  [place] Resident 03/12/2019 1:39 PM

## 2019-03-12 NOTE — Telephone Encounter (Signed)
T/c to set up palliative visit but see patient is inpatient. Message left with daughter to f/u for home visit when she leaves hospital.

## 2019-03-12 NOTE — Progress Notes (Signed)
Please note, patient is currently followed by [facility] outpatient Palliative. [place] aware, Palliative admin notified. Flo Shanks BSN, RN, [facility] [phone]

## 2019-03-12 NOTE — Progress Notes (Addendum)
[place] at [place] NAME: Michelle [name]    MR#:  [id number]  DATE OF BIRTH:  01/04/1961  SUBJECTIVE:   Patient continues feeling better every day.  Her shortness of breath has improved.  She is thirsty, but otherwise doing well.  No fevers or chills.  REVIEW OF SYSTEMS:  Review of Systems  Constitutional: Negative for chills and fever.  HENT: Negative for congestion and sore throat.   Eyes: Negative for blurred vision and double vision.  Respiratory: Negative for cough, shortness of breath and wheezing.   Cardiovascular: Negative for chest pain and palpitations.  Gastrointestinal: Negative for nausea and vomiting.  Genitourinary: Negative for dysuria and urgency.  Musculoskeletal: Negative for back pain and neck pain.  Skin: Negative for rash.  Neurological: Negative for dizziness and headaches.  Psychiatric/Behavioral: Negative for depression.   DRUG ALLERGIES:   Allergies  Allergen Reactions  . Amoxicillin     Patient has tolerated cephalosporins in the past   VITALS:  Blood pressure 121/81, pulse 80, temperature 98.3 F (36.8 C), temperature source Oral, resp. rate 15, height 5' (1.524 m), weight 87.1 kg, last menstrual period 03/06/2005, SpO2 100 %. PHYSICAL EXAMINATION:  Physical Exam  GENERAL:  Laying in the bed with no acute distress.  HEENT: Head atraumatic, normocephalic. Pupils equal, round, reactive to light and accommodation. No scleral icterus. Extraocular muscles intact. Oropharynx and nasopharynx clear.  NECK:  Supple, no jugular venous distention. No thyroid enlargement. +trach with vent LUNGS: + Diminished breath sounds throughout all lung fields.  No wheezing. Normal work of breathing. CARDIOVASCULAR: RRR, S1, S2 normal. No murmurs, rubs, or gallops.  ABDOMEN: Soft, nontender, nondistended. Bowel sounds present. +PEG EXTREMITIES: No pedal edema, cyanosis, or clubbing.  NEUROLOGIC: CN 2-12 intact, no focal  deficits. 5/5 muscle strength throughout all extremities. Sensation intact throughout. Gait not checked.  PSYCHIATRIC: The patient is alert and oriented x 3.  SKIN: No obvious rash, lesion, or ulcer.  LABORATORY PANEL:  Female CBC Recent Labs  Lab 03/12/19 0410  WBC 9.6  HGB 9.9*  HCT 32.8*  PLT 316   ------------------------------------------------------------------------------------------------------------------ Chemistries  Recent Labs  Lab 03/08/19 1009  03/12/19 0410  NA 139   < > 136  K 4.2   < > 3.6  CL 106   < > 99  CO2 22   < > 28  GLUCOSE 176*   < > 301*  BUN 8   < > 40*  CREATININE 0.66   < > 0.78  CALCIUM 9.1   < > 8.5*  MG  --    < > 2.6*  AST 31  --   --   ALT 21  --   --   ALKPHOS 122  --   --   BILITOT 0.7  --   --    < > = values in this interval not displayed.   RADIOLOGY:  No results found. ASSESSMENT AND PLAN:   Acute on chronic hypoxic respiratory failure secondary to COPD exacerbation and CAP- patient is on 3 L trach collar at baseline.  Remains on ventilator this morning. Initially meeting sepsis criteria, but this resolved. -Sputum culture with serratia and pseudomonas -Switched from cefepime to ciprofloxacin due to sputum culture susceptibilities -Vent management per CCM -COVID test is negative -Change from IV solumedrol to prednisone today -DuoNebs scheduled -Continue home inhalers -Wean off vent as able -Follows with [facility] as an outpatient  Type 2 diabetes- new diagnosis. A1c  6.7% this admission. -Continue SSI  Hypertension- normal BP today. -IV Labetalol prn  Chronic diastolic CHF- no signs of volume overload. BNP normal. -Lasix 20mg  IV daily  CAD- stable, no active chest pain. Initial troponin negative. -Continue home aspirin  Chronic pain syndrome- stable -Continue home fentanyl patch  Hyperlipidemia- stable -Continue home Zocor  Chronic anxiety with panic disorder-stable -Continue home Remeron, Paxil,  Seroquel, Klonopin  Microcytic anemia- hemoglobin low but stable -Check anemia panel  All the records are reviewed and case discussed with Care Management/Social Worker. Management plans discussed with the patient, family and they are in agreement.  CODE STATUS: DNR  TOTAL TIME TAKING CARE OF THIS PATIENT: 38 minutes.   More than 50% of the time was spent in counseling/coordination of care: YES  POSSIBLE D/C 2-3 days, DEPENDING ON CLINICAL CONDITION.   Berna Spare Mayo M.D on 03/12/2019 at 2:56 PM  Between 7am to 6pm - Pager [phone]  After 6pm go to [email or web address] - Technical brewer [facility]  Office  [phone]  CC: Primary care physician; Frazier Richards, MD  Note: This dictation was prepared with Dragon dictation along with smaller phrase technology. Any transcriptional errors that result from this process are unintentional.

## 2019-03-12 NOTE — Consult Note (Addendum)
Pharmacy Antibiotic Note  Michelle [name] is a 58 y.o. female admitted on 03/08/2019 with pneumonia. Patient admitted for acute on chronic hypoxic respiratory failure secondary to COPD exacerbation/PNA. She was initially started on azithromycin monotherapy which was converted to ceftriaxone after Serratia was noted to be growing in tracheal aspirate culture. Ceftriaxone was switch to cefepime after culture update showed additional PSA. Pseudomonas aeruginosa susceptibilities resulted indicating intermediate susceptibility to cefepime. Therapy altered to ciprofloxacin monotherapy.Converting route of ciprofloxacin to PO via tube. Pharmacy has been consulted for ciprofloxacin dosing.  Patient with trach on ventilator. Patient with PCT of <0.10 and WBC of 9.6, values trending down. Afebrile last 24h with recent temp of 98.59F.   Plan: Ciprofloxacin IV to PO conversion -400 mg IV q8h = 750 mg tablet q12h (per package insert) -Initiate Ciprofloxacin 750 mg q12hr via tube -higher dosing range chosen due to MIC of 1 for pseudomonas aeruginosa   Continue to monitor cultures, renal function, and clinical status for signs of improvement with antibiotics.   Height: 5' (152.4 cm) Weight: 192 lb 0.3 oz (87.1 kg) IBW/kg (Calculated) : 45.5  Temp (24hrs), Avg:98.2 F (36.8 C), Min:97.7 F (36.5 C), Max:98.5 F (36.9 C)  Recent Labs  Lab 03/08/19 1009 03/08/19 1526 03/09/19 0626 03/11/19 0532 03/12/19 0410  WBC 13.0*  --  17.8* 11.9* 9.6  CREATININE 0.66  --  0.80 0.94 0.78  LATICACIDVEN  --  1.5  --   --   --     Estimated Creatinine Clearance: 76.1 mL/min (by C-G formula based on SCr of 0.78 mg/dL).    Allergies  Allergen Reactions  . Amoxicillin     Patient has tolerated cephalosporins in the past    Antimicrobials this admission: Doxy 10/4 x1 Azithromycin 10/4 >> 10/5 Ceftriaxone 10/6 >> 10/6 Cefepime 10/6 >>10/7 Ciprofloxacin 10/7 >>  Microbiology results:  10/4 Tracheal aspirate:  moderate serratia marcescens, moderate pseudomonas aeruginosa (intermediate to cefepime)-- both bugs susceptible to cipro 10/4 BCx: No growth x 3 days   10/4 MRSA PCR: negative 10/4 COVID 19: negative  Thank you for allowing pharmacy to be a part of this patient's care.  Sallye Lat  PharmD Candidate 2021 03/12/2019 11:13 AM

## 2019-03-13 LAB — CBC WITH DIFFERENTIAL/PLATELET
Abs Immature Granulocytes: 0.11 10*3/uL — ABNORMAL HIGH (ref 0.00–0.07)
Basophils Absolute: 0 10*3/uL (ref 0.0–0.1)
Basophils Relative: 0 %
Eosinophils Absolute: 0.1 10*3/uL (ref 0.0–0.5)
Eosinophils Relative: 1 %
HCT: 35.5 % — ABNORMAL LOW (ref 36.0–46.0)
Hemoglobin: 10.6 g/dL — ABNORMAL LOW (ref 12.0–15.0)
Immature Granulocytes: 1 %
Lymphocytes Relative: 22 %
Lymphs Abs: 2.7 10*3/uL (ref 0.7–4.0)
MCH: 23.8 pg — ABNORMAL LOW (ref 26.0–34.0)
MCHC: 29.9 g/dL — ABNORMAL LOW (ref 30.0–36.0)
MCV: 79.6 fL — ABNORMAL LOW (ref 80.0–100.0)
Monocytes Absolute: 1.5 10*3/uL — ABNORMAL HIGH (ref 0.1–1.0)
Monocytes Relative: 12 %
Neutro Abs: 7.8 10*3/uL — ABNORMAL HIGH (ref 1.7–7.7)
Neutrophils Relative %: 64 %
Platelets: 345 10*3/uL (ref 150–400)
RBC: 4.46 MIL/uL (ref 3.87–5.11)
RDW: 15.7 % — ABNORMAL HIGH (ref 11.5–15.5)
WBC: 12.3 10*3/uL — ABNORMAL HIGH (ref 4.0–10.5)
nRBC: 0 % (ref 0.0–0.2)

## 2019-03-13 LAB — CULTURE, BLOOD (ROUTINE X 2)
Culture: NO GROWTH
Culture: NO GROWTH
Special Requests: ADEQUATE

## 2019-03-13 LAB — GLUCOSE, CAPILLARY
Glucose-Capillary: 175 mg/dL — ABNORMAL HIGH (ref 70–99)
Glucose-Capillary: 157 mg/dL — ABNORMAL HIGH (ref 70–99)
Glucose-Capillary: 160 mg/dL — ABNORMAL HIGH (ref 70–99)
Glucose-Capillary: 217 mg/dL — ABNORMAL HIGH (ref 70–99)
Glucose-Capillary: 207 mg/dL — ABNORMAL HIGH (ref 70–99)
Glucose-Capillary: 159 mg/dL — ABNORMAL HIGH (ref 70–99)

## 2019-03-13 LAB — FERRITIN: Ferritin: 24 ng/mL (ref 11–307)

## 2019-03-13 LAB — IRON AND TIBC
Iron: 45 ug/dL (ref 28–170)
Saturation Ratios: 9 % — ABNORMAL LOW (ref 10.4–31.8)
TIBC: 511 ug/dL — ABNORMAL HIGH (ref 250–450)
UIBC: 466 ug/dL

## 2019-03-13 LAB — BASIC METABOLIC PANEL
Anion gap: 6 (ref 5–15)
BUN: 42 mg/dL — ABNORMAL HIGH (ref 6–20)
CO2: 29 mmol/L (ref 22–32)
Calcium: 8.9 mg/dL (ref 8.9–10.3)
Chloride: 104 mmol/L (ref 98–111)
Creatinine, Ser: 0.7 mg/dL (ref 0.44–1.00)
GFR calc Af Amer: >60 mL/min (ref >60)
GFR calc non Af Amer: >60 mL/min (ref >60)
Glucose, Bld: 177 mg/dL — ABNORMAL HIGH (ref 70–99)
Potassium: 4.2 mmol/L (ref 3.5–5.1)
Sodium: 139 mmol/L (ref 135–145)

## 2019-03-13 LAB — VITAMIN B12: Vitamin B-12: 592 pg/mL (ref 180–914)

## 2019-03-13 LAB — FOLATE: Folate: 15.9 ng/mL (ref >5.9)

## 2019-03-13 MED ORDER — QUETIAPINE FUMARATE 25 MG PO TABS: 50.0000 mg | ORAL_TABLET | Freq: Two times a day (BID) | ORAL | Status: DC

## 2019-03-13 MED ORDER — IPRATROPIUM-ALBUTEROL 0.5-2.5 (3) MG/3ML IN SOLN
3.0000 mL | Freq: Four times a day (QID) | RESPIRATORY_TRACT | Status: DC
  Administered 2019-03-13 – 2019-03-14 (×3): 3 mL via RESPIRATORY_TRACT
  Filled 2019-03-13 (×3): qty 3

## 2019-03-13 MED ORDER — QUETIAPINE FUMARATE 25 MG PO TABS
25.0000 mg | ORAL_TABLET | Freq: Two times a day (BID) | ORAL | Status: DC
  Administered 2019-03-13 – 2019-03-14 (×2): 25 mg
  Filled 2019-03-13 (×2): qty 1

## 2019-03-13 NOTE — Clinical Social Work Note (Signed)
CSW attempted to call patient's daughter regarding discharge plan. CSW left voicemail for daughter to call CSW at her convenience. CSW will continue to follow and complete assessment with daughter when available.   [place] [phone]

## 2019-03-13 NOTE — Consult Note (Addendum)
Pharmacy Antibiotic Note  Michelle [name] is a 58 y.o. female admitted on 03/08/2019 with pneumonia. Patient admitted for acute on chronic hypoxic respiratory failure secondary to COPD exacerbation/PNA. She was initially started on azithromycin monotherapy which was converted to ceftriaxone after Serratia was noted to be growing in tracheal aspirate culture. Ceftriaxone was switch to cefepime after culture update showed additional PSA. Pseudomonas aeruginosa susceptibilities resulted indicating intermediate susceptibility to cefepime. Therapy altered to ciprofloxacin monotherapy.Converting route of ciprofloxacin to PO via tube. Pharmacy has been consulted for ciprofloxacin dosing.  Patient with trach on ventilator. Patient with PCT of <0.10. WBC of 12.3, value trending up. CBC w/differential showed left shift. Afebrile last 24h with recent temp of 97.64F.   Plan: Ciprofloxacin IV to PO conversion -400 mg IV q8h= 750 mg tablet q12h (per package insert) -Continue Ciprofloxacin 750 mg q12hr via tube, day 3 of therapy -higher dosing range chosen due to MIC of 1 for pseudomonas aeruginosa -Monitor QTc, latest ECG showed QTc of 513. Patient is also on quetiapine.  Continue to monitor cultures, renal function, QTc, and clinical status for signs of improvement with antibiotics.   Height: 5' (152.4 cm) Weight: 188 lb 11.4 oz (85.6 kg) IBW/kg (Calculated) : 45.5  Temp (24hrs), Avg:98 F (36.7 C), Min:97.8 F (36.6 C), Max:98.3 F (36.8 C)  Recent Labs  Lab 03/08/19 1009 03/08/19 1526 03/09/19 0626 03/11/19 0532 03/12/19 0410 03/13/19 0404  WBC 13.0*  --  17.8* 11.9* 9.6 12.3*  CREATININE 0.66  --  0.80 0.94 0.78 0.70  LATICACIDVEN  --  1.5  --   --   --   --     Estimated Creatinine Clearance: 75.3 mL/min (by C-G formula based on SCr of 0.7 mg/dL).    Allergies  Allergen Reactions  . Amoxicillin     Patient has tolerated cephalosporins in the past    Antimicrobials this admission: Doxy  10/4 x1 Azithromycin 10/4 >> 10/5 Ceftriaxone 10/6 >> 10/6 Cefepime 10/6 >>10/7 Ciprofloxacin 10/7 >>  Microbiology results:  10/4 Tracheal aspirate: moderate serratia marcescens, moderate pseudomonas aeruginosa (intermediate to cefepime)-- both bugs susceptible to cipro, rare gram positive cocci 10/4 BCx: No growth x 5 days   10/4 MRSA PCR: negative 10/4 COVID 19: negative  Thank you for allowing pharmacy to be a part of this patient's care.  Sallye Lat  PharmD Candidate 2021 03/13/2019 10:57 AM

## 2019-03-13 NOTE — Consult Note (Signed)
PHARMACY CONSULT NOTE - FOLLOW UP  Pharmacy Consult for Electrolyte Monitoring and Replacement   Recent Labs: Potassium (mmol/L)  Date Value  03/13/2019 4.2  07/11/2014 4.6   Magnesium (mg/dL)  Date Value  03/12/2019 2.6 (H)  10/31/2013 2.6 (H)   Calcium (mg/dL)  Date Value  03/13/2019 8.9   Calcium, Total (mg/dL)  Date Value  07/11/2014 8.9   Albumin (g/dL)  Date Value  03/08/2019 4.1  07/09/2014 3.7   Phosphorus (mg/dL)  Date Value  03/12/2019 2.9  10/31/2013 4.6   Sodium (mmol/L)  Date Value  03/13/2019 139  07/11/2014 139     Assessment: 58 y/o female with history of severe COPD requiring continuous mechanical ventilation through tracheostomy, CAD, CHF, and HTN who was admitted for acute exacerbation/PNA. Patient is on IV lasix 20 mg daily. Patient on tube feeds via PEG tube. Glucoses have been persistently elevated and basal insulin has been titrated up with addition of scheduled rapid acting insulin q4h while on tube feeds and SSI. Steroids have been tapered to prednisone 20 mg daily. Per chart, patient appears is having regular bowel movements.   Corrected sodium for hyperglycemia is 141. Potassium improved today after repletion yesterday.  Glucoses improved. Scr 0.7, trending down and BUN 42, trending up. UOP 1.3L yesterday. LBM 10/9.    Goal of Therapy:  K ~4 and Mg ~2  Plan:  -No repletion indicated today -BMP, mg, and phos tomorrow AM  Sallye Lat PharmD Candidate 03/13/2019 11:57 AM

## 2019-03-13 NOTE — Progress Notes (Signed)
CRITICAL CARE NOTE Michelle [name] is an 58 y.o. female with known end-stage COPD, chronically ventilator dependent, admitted on 10/4 due to acute on chronic respiratory failure after noting increasing shortness of breath for 2 weeks prior to admission. Patient also noted increasing secretions through her tracheostomy. Patient is chronically on aTrilogyvent at home. CC  follow up respiratory failure  SUBJECTIVE End stage resp failure On chronic vent +pseudomonal pneumonia   BP 97/68 (BP Location: Left Arm)   Pulse 90   Temp 97.8 F (36.6 C) (Oral)   Resp 16   Ht 5' (1.524 m)   Wt 85.6 kg   LMP 03/06/2005 (Approximate)   SpO2 98%   BMI 36.86 kg/m    I/O last 3 completed shifts: In: 600 [IV Piggyback:600] Out: 2100 [Urine:2100] No intake/output data recorded.  SpO2: 98 % O2 Flow Rate (L/min): 1 L/min FiO2 (%): 24 %   SIGNIFICANT EVENTS 10/4 admitted for acute on chronic respiratory failure 10/5 Serratia in sputum, gram-negative coverage broadened 10/6 Pseudomonas @ sputum as well 10/6 switched back to her home vent 10/7 Pseudomonas only intermediate to cefepime, switched to Cipro  REVIEW OF SYSTEMS  PATIENT IS UNABLE TO PROVIDE COMPLETE REVIEW OF SYSTEMS DUE TO SEVERE CRITICAL ILLNESS   PHYSICAL EXAMINATION:  GENERAL:critically ill appearing, +resp distress HEAD: Normocephalic, atraumatic.  EYES: Pupils equal, round, reactive to light.  No scleral icterus.  MOUTH: Moist mucosal membrane. NECK: Supple.  PULMONARY: +rhonchi, +wheezing CARDIOVASCULAR: S1 and S2. Regular rate and rhythm. No murmurs, rubs, or gallops.  GASTROINTESTINAL: Soft, nontender, -distended. No masses. Positive bowel sounds. No hepatosplenomegaly.  MUSCULOSKELETAL: No swelling, clubbing, or edema.  NEUROLOGIC: obtunded, GCS<8 SKIN:intact,warm,dry  MEDICATIONS: I have reviewed all medications and confirmed regimen as documented   CULTURE RESULTS   Recent Results (from the past 240  hour(s))  SARS Coronavirus 2 [facility] order, Performed in [facility] hospital lab) Nasopharyngeal Nasopharyngeal Swab     Status: None   Collection Time: 03/08/19 10:09 AM   Specimen: Nasopharyngeal Swab  Result Value Ref Range Status   SARS Coronavirus 2 NEGATIVE NEGATIVE Final    Comment: (NOTE) If result is NEGATIVE SARS-CoV-2 target nucleic acids are NOT DETECTED. The SARS-CoV-2 RNA is generally detectable in upper and lower  respiratory specimens during the acute phase of infection. The lowest  concentration of SARS-CoV-2 viral copies this assay can detect is 250  copies / mL. A negative result does not preclude SARS-CoV-2 infection  and should not be used as the sole basis for treatment or other  patient management decisions.  A negative result may occur with  improper specimen collection / handling, submission of specimen other  than nasopharyngeal swab, presence of viral mutation(s) within the  areas targeted by this assay, and inadequate number of viral copies  (<250 copies / mL). A negative result must be combined with clinical  observations, patient history, and epidemiological information. If result is POSITIVE SARS-CoV-2 target nucleic acids are DETECTED. The SARS-CoV-2 RNA is generally detectable in upper and lower  respiratory specimens dur ing the acute phase of infection.  Positive  results are indicative of active infection with SARS-CoV-2.  Clinical  correlation with patient history and other diagnostic information is  necessary to determine patient infection status.  Positive results do  not rule out bacterial infection or co-infection with other viruses. If result is PRESUMPTIVE POSTIVE SARS-CoV-2 nucleic acids MAY BE PRESENT.   A presumptive positive result was obtained on the submitted specimen  and confirmed on  repeat testing.  While 2019 novel coronavirus  (SARS-CoV-2) nucleic acids may be present in the submitted sample  additional confirmatory testing  may be necessary for epidemiological  and / or clinical management purposes  to differentiate between  SARS-CoV-2 and other Sarbecovirus currently known to infect humans.  If clinically indicated additional testing with an alternate test  methodology [id number]) is advised. The SARS-CoV-2 RNA is generally  detectable in upper and lower respiratory sp ecimens during the acute  phase of infection. The expected result is Negative. Fact Sheet for Patients:  StrictlyIdeas.no Fact Sheet for Healthcare Providers: BankingDealers.co.za This test is not yet approved or cleared by the [place] FDA and has been authorized for detection and/or diagnosis of SARS-CoV-2 by FDA under an Emergency Use Authorization (EUA).  This EUA will remain in effect (meaning this test can be used) for the duration of the COVID-19 declaration under Section 564(b)(1) of the Act, 21 U.S.C. section 360bbb-3(b)(1), unless the authorization is terminated or revoked sooner. Performed at [facility], [address] [place], [place] [address]   Culture, respiratory (non-expectorated)     Status: None   Collection Time: 03/08/19 10:10 AM   Specimen: Tracheal Aspirate; Respiratory  Result Value Ref Range Status   Specimen Description   Final    TRACHEAL ASPIRATE Performed at [facility], [address] [place], [place] [address]    Special Requests   Final    NONE Performed at [facility], [address] [place], [place] [address]    Gram Stain   Final    RARE WBC PRESENT, PREDOMINANTLY PMN FEW GRAM POSITIVE RODS RARE GRAM POSITIVE COCCI RARE YEAST Performed at [facility], [place] [address], [place], [place] [address]    Culture   Final    MODERATE SERRATIA MARCESCENS MODERATE PSEUDOMONAS AERUGINOSA    Report Status 03/11/2019 FINAL  Final   Organism ID, Bacteria SERRATIA MARCESCENS  Final   Organism ID, Bacteria  PSEUDOMONAS AERUGINOSA  Final      Susceptibility   Pseudomonas aeruginosa - MIC*    CEFTAZIDIME 4 SENSITIVE Sensitive     CIPROFLOXACIN 1 SENSITIVE Sensitive     GENTAMICIN 4 SENSITIVE Sensitive     IMIPENEM >=16 RESISTANT Resistant     PIP/TAZO 8 SENSITIVE Sensitive     CEFEPIME 16 INTERMEDIATE Intermediate     * MODERATE PSEUDOMONAS AERUGINOSA   Serratia marcescens - MIC*    CEFAZOLIN >=64 RESISTANT Resistant     CEFEPIME <=1 SENSITIVE Sensitive     CEFTAZIDIME <=1 SENSITIVE Sensitive     CEFTRIAXONE <=1 SENSITIVE Sensitive     CIPROFLOXACIN <=0.25 SENSITIVE Sensitive     GENTAMICIN <=1 SENSITIVE Sensitive     TRIMETH/SULFA <=20 SENSITIVE Sensitive     * MODERATE SERRATIA MARCESCENS  MRSA PCR Screening     Status: None   Collection Time: 03/08/19  3:13 PM   Specimen: Nasopharyngeal  Result Value Ref Range Status   MRSA by PCR NEGATIVE NEGATIVE Final    Comment:        The GeneXpert MRSA Assay (FDA approved for NASAL specimens only), is one component of a comprehensive MRSA colonization surveillance program. It is not intended to diagnose MRSA infection nor to guide or monitor treatment for MRSA infections. Performed at [facility], [place],  [address]   CULTURE, BLOOD (ROUTINE X 2) w Reflex to ID Panel     Status: None   Collection Time: 03/08/19  3:50 PM   Specimen: BLOOD  Result Value Ref Range Status   Specimen Description BLOOD LEFT ANTECUBITAL  Final   Special Requests   Final    BOTTLES DRAWN AEROBIC AND ANAEROBIC Blood Culture adequate volume   Culture   Final    NO GROWTH 5 DAYS Performed at [facility], [address] [place], [place] [address]    Report Status 03/13/2019 FINAL  Final  CULTURE, BLOOD (ROUTINE X 2) w Reflex to ID Panel     Status: None   Collection Time: 03/08/19  3:50 PM   Specimen: BLOOD  Result Value Ref Range Status   Specimen Description BLOOD BLOOD RIGHT HAND  Final   Special Requests    Final    BOTTLES DRAWN AEROBIC AND ANAEROBIC Blood Culture results may not be optimal due to an inadequate volume of blood received in culture bottles   Culture   Final    NO GROWTH 5 DAYS Performed at [facility], [address], [place], [place] [address]    Report Status 03/13/2019 FINAL  Final              ASSESSMENT AND PLAN SYNOPSIS   Severe ACUTE Hypoxic and Hypercapnic Respiratory Failure end stage  COPD -continue  MV support -continue Bronchodilator Therapy -Wean Fio2 and PEEP as tolerated   CARDIAC ICU monitoring  ID -continue IV abx as prescibed -follow up cultures - Serratia marcescens and Pseudomonas aeruginosa pneumonia  GI GI PROPHYLAXIS as indicated  NUTRITIONAL STATUS DIET-->NPO Constipation protocol as indicated  ENDO - will use ICU hypoglycemic\Hyperglycemia protocol if indicated   ELECTROLYTES -follow labs as needed -replace as needed -pharmacy consultation and following   DVT/GI PRX ordered TRANSFUSIONS AS NEEDED MONITOR FSBS ASSESS the need for LABS as needed  SD status  Corrin Parker, M.D.  [facility]  Medical Director [place] Director [place] Department

## 2019-03-14 ENCOUNTER — Inpatient Hospital Stay: Payer: Medicaid Other

## 2019-03-14 LAB — BASIC METABOLIC PANEL
Anion gap: 9 (ref 5–15)
BUN: 46 mg/dL — ABNORMAL HIGH (ref 6–20)
CO2: 28 mmol/L (ref 22–32)
Calcium: 8.8 mg/dL — ABNORMAL LOW (ref 8.9–10.3)
Chloride: 102 mmol/L (ref 98–111)
Creatinine, Ser: 0.7 mg/dL (ref 0.44–1.00)
GFR calc Af Amer: >60 mL/min (ref >60)
GFR calc non Af Amer: >60 mL/min (ref >60)
Glucose, Bld: 127 mg/dL — ABNORMAL HIGH (ref 70–99)
Potassium: 4.2 mmol/L (ref 3.5–5.1)
Sodium: 139 mmol/L (ref 135–145)

## 2019-03-14 LAB — CBC WITH DIFFERENTIAL/PLATELET
Abs Immature Granulocytes: 0.21 10*3/uL — ABNORMAL HIGH (ref 0.00–0.07)
Basophils Absolute: 0 10*3/uL (ref 0.0–0.1)
Basophils Relative: 0 %
Eosinophils Absolute: 0.4 10*3/uL (ref 0.0–0.5)
Eosinophils Relative: 3 %
HCT: 37.1 % (ref 36.0–46.0)
Hemoglobin: 10.9 g/dL — ABNORMAL LOW (ref 12.0–15.0)
Immature Granulocytes: 1 %
Lymphocytes Relative: 21 %
Lymphs Abs: 3.3 10*3/uL (ref 0.7–4.0)
MCH: 23.4 pg — ABNORMAL LOW (ref 26.0–34.0)
MCHC: 29.4 g/dL — ABNORMAL LOW (ref 30.0–36.0)
MCV: 79.6 fL — ABNORMAL LOW (ref 80.0–100.0)
Monocytes Absolute: 1.7 10*3/uL — ABNORMAL HIGH (ref 0.1–1.0)
Monocytes Relative: 11 %
Neutro Abs: 10.5 10*3/uL — ABNORMAL HIGH (ref 1.7–7.7)
Neutrophils Relative %: 64 %
Platelets: 359 10*3/uL (ref 150–400)
RBC: 4.66 MIL/uL (ref 3.87–5.11)
RDW: 16.3 % — ABNORMAL HIGH (ref 11.5–15.5)
WBC: 16.1 10*3/uL — ABNORMAL HIGH (ref 4.0–10.5)
nRBC: 0.2 % (ref 0.0–0.2)

## 2019-03-14 LAB — GLUCOSE, CAPILLARY
Glucose-Capillary: 167 mg/dL — ABNORMAL HIGH (ref 70–99)
Glucose-Capillary: 143 mg/dL — ABNORMAL HIGH (ref 70–99)
Glucose-Capillary: 166 mg/dL — ABNORMAL HIGH (ref 70–99)

## 2019-03-14 LAB — PHOSPHORUS: Phosphorus: 4.4 mg/dL (ref 2.5–4.6)

## 2019-03-14 LAB — MAGNESIUM: Magnesium: 2.7 mg/dL — ABNORMAL HIGH (ref 1.7–2.4)

## 2019-03-14 MED ORDER — DIPHENHYDRAMINE HCL 50 MG/ML IJ SOLN
25.0000 mg | Freq: Once | INTRAMUSCULAR | Status: AC
  Administered 2019-03-14: 03:00:00 25 mg via INTRAVENOUS

## 2019-03-14 MED ORDER — CIPROFLOXACIN HCL 750 MG PO TABS: 750.0000 mg | ORAL_TABLET | Freq: Two times a day (BID) | ORAL | 0 refills | Status: DC

## 2019-03-14 NOTE — Consult Note (Signed)
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  Patient on tube feeds via PEG tube. Glucoses have been persistently elevated and basal insulin has been titrated up with addition of scheduled rapid acting insulin q4h while on tube feeds and SSI. Steroids have been tapered to prednisone 20 mg daily. Per chart, patient appears is having regular bowel movements.   Corrected sodium for hyperglycemia is 141. Potassium improved today after repletion yesterday.  Glucoses improved. Scr 0.7, trending down and BUN 42, trending up. UOP 1.3L yesterday. LBM 10/9. Mg slightly elevated.    Goal of Therapy:  K ~4 and Mg ~2  Plan:  -No repletion indicated today -BMP, mg, and phos tomorrow AM  Oswald Hillock, PharmD, BCPS 03/14/2019 7:39 AM

## 2019-03-14 NOTE — Progress Notes (Signed)
[facility] [place] NAME: Aishat Wollin    MR#:  [id number]  DATE OF BIRTH:  [birth year]  SUBJECTIVE:   SOB better. Afebrile  REVIEW OF SYSTEMS:  Review of Systems  Constitutional: Negative for chills and fever.  HENT: Negative for congestion and sore throat.   Eyes: Negative for blurred vision and double vision.  Respiratory: Negative for cough, shortness of breath and wheezing.   Cardiovascular: Negative for chest pain and palpitations.  Gastrointestinal: Negative for nausea and vomiting.  Genitourinary: Negative for dysuria and urgency.  Musculoskeletal: Negative for back pain and neck pain.  Skin: Negative for rash.  Neurological: Negative for dizziness and headaches.  Psychiatric/Behavioral: Negative for depression.   DRUG ALLERGIES:   Allergies  Allergen Reactions  . Amoxicillin     Patient has tolerated cephalosporins in the past   VITALS:  Blood pressure 113/80, pulse 82, temperature 98.3 F (36.8 C), temperature source Oral, resp. rate 16, height 5' (1.524 m), weight 85.9 kg, last menstrual period 03/06/2005, SpO2 100 %. PHYSICAL EXAMINATION:  Physical Exam  GENERAL:  Laying in the bed with no acute distress.  HEENT: Head atraumatic, normocephalic. Pupils equal, round, reactive to light and accommodation. No scleral icterus. Extraocular muscles intact. Oropharynx and nasopharynx clear.  NECK:  Supple, no jugular venous distention. No thyroid enlargement. +trach with vent LUNGS: + Diminished breath sounds throughout all lung fields.  No wheezing. Normal work of breathing. CARDIOVASCULAR: RRR, S1, S2 normal. No murmurs, rubs, or gallops.  ABDOMEN: Soft, nontender, nondistended. Bowel sounds present. +PEG. EXTREMITIES: No pedal edema, cyanosis, or clubbing.  NEUROLOGIC: CN 2-12 intact, no focal deficits. 5/5 muscle strength throughout all extremities. Sensation intact throughout. Gait not checked.  PSYCHIATRIC: The patient  is alert and oriented x 3.  SKIN: No obvious rash, lesion, or ulcer.  LABORATORY PANEL:  Female CBC Recent Labs  Lab 03/14/19 0603  WBC 16.1*  HGB 10.9*  HCT 37.1  PLT 359   ------------------------------------------------------------------------------------------------------------------ Chemistries  Recent Labs  Lab 03/08/19 1009  03/14/19 0603  NA 139   < > 139  K 4.2   < > 4.2  CL 106   < > 102  CO2 22   < > 28  GLUCOSE 176*   < > 127*  BUN 8   < > 46*  CREATININE 0.66   < > 0.70  CALCIUM 9.1   < > 8.8*  MG  --    < > 2.7*  AST 31  --   --   ALT 21  --   --   ALKPHOS 122  --   --   BILITOT 0.7  --   --    < > = values in this interval not displayed.   RADIOLOGY:  Dg Chest Port 1 View  Result Date: 03/14/2019 CLINICAL DATA:  Acute respiratory failure EXAM: PORTABLE CHEST 1 VIEW COMPARISON:  Radiograph 03/09/2019 FINDINGS: Tracheostomy tube unchanged. Stable cardiac silhouette. Emphysema the upper lobes. Mild basilar atelectasis. No effusion, infiltrate pneumothorax. IMPRESSION: 1. No interval change. 2. Emphysema. 3. Basilar atelectasis. Electronically Signed   By: Suzy Bouchard M.D.   On: 03/14/2019 09:14   ASSESSMENT AND PLAN:   Acute on chronic hypoxic respiratory failure secondary to COPD exacerbation and CAP- patient is on 3 L trach collar at baseline.  Remains on ventilator this morning. Initially meeting sepsis criteria, but this resolved. -Sputum culture with serratia and pseudomonas -ON ciprofloxacin and prednisone -Vent management  per CCM -COVID test is negative -DuoNebs scheduled, Continue home inhalers -Wean off vent as able  Type 2 diabetes- new diagnosis. A1c 6.7% this admission. -Continue SSI  Hypertension- normal BP today. -IV Labetalol prn  Chronic diastolic CHF- no signs of volume overload. BNP normal. -Lasix 20mg  IV daily  CAD- stable, no active chest pain. Initial troponin negative. -Continue home aspirin  Chronic pain  syndrome- stable -Continue home fentanyl patch  Hyperlipidemia- stable -Continue home Zocor  Chronic anxiety with panic disorder-stable -Continue home Remeron, Paxil, Seroquel, Klonopin  Microcytic anemia- hemoglobin low but stable -Check anemia panel  All the records are reviewed and case discussed with Care Management/Social Worker. Management plans discussed with the patient, family and they are in agreement.  CODE STATUS: DNR  TOTAL TIME TAKING CARE OF THIS PATIENT: 34 minutes.   POSSIBLE D/C 1-2 days, DEPENDING ON CLINICAL CONDITION.  Leia Alf Ellin Fitzgibbons M.D on 03/14/2019 at 1:06 PM  Between 7am to 6pm - Pager - [phone]  After 6pm go to [email or web address] - Technical brewer [facility]  Office  [phone]  CC: Primary care physician; Frazier Richards, MD  Note: This dictation was prepared with Dragon dictation along with smaller phrase technology. Any transcriptional errors that result from this process are unintentional.

## 2019-03-14 NOTE — Progress Notes (Signed)
Patient discharged home via [facility]. Daughter [name] aware of discharge and is home waiting from EMS to arrive. EMS transported patient on home trilogy ventilator through established trach. Discharge packet placed in patient belongings bag and sent with EMS.

## 2019-03-14 NOTE — Discharge Summary (Signed)
DISCHARGE DX  PSEUDOMONAL PNEUMONIA AND COPD EXACERBATION    CRITICAL CARE NOTE Michelle [name] is an 58 y.o. female with known end-stage COPD, chronically ventilator dependent, admitted on 10/4 due to acute on chronic respiratory failure after noting increasing shortness of breath for 2 weeks prior to admission. Patient also noted increasing secretions through her tracheostomy. Patient is chronically on aTrilogyvent at home. CC  follow up respiratory failure  SUBJECTIVE End stage resp failure On chronic vent +pseudomonal pneumonia   BP 113/80   Pulse 85   Temp 98.3 F (36.8 C) (Oral)   Resp 16   Ht 5' (1.524 m)   Wt 85.9 kg   LMP 03/06/2005 (Approximate)   SpO2 97%   BMI 36.98 kg/m    I/O last 3 completed shifts: In: 2880 [NG/GT:2880] Out: 2150 [Urine:2150] Total I/O In: 200 [NG/GT:200] Out: -   SpO2: 97 % O2 Flow Rate (L/min): 1 L/min FiO2 (%): 21 %   SIGNIFICANT EVENTS 10/4 admitted for acute on chronic respiratory failure 10/5 Serratia in sputum, gram-negative coverage broadened 10/6 Pseudomonas @ sputum as well 10/6 switched back to her home vent 10/7 Pseudomonas only intermediate to cefepime, switched to Cipro  REVIEW OF SYSTEMS  STABLE UPON DISCHARGE   CULTURE RESULTS   Recent Results (from the past 240 hour(s))  SARS Coronavirus 2 [facility] order, Performed in [facility] hospital lab) Nasopharyngeal Nasopharyngeal Swab     Status: None   Collection Time: 03/08/19 10:09 AM   Specimen: Nasopharyngeal Swab  Result Value Ref Range Status   SARS Coronavirus 2 NEGATIVE NEGATIVE Final    Comment: (NOTE) If result is NEGATIVE SARS-CoV-2 target nucleic acids are NOT DETECTED. The SARS-CoV-2 RNA is generally detectable in upper and lower  respiratory specimens during the acute phase of infection. The lowest  concentration of SARS-CoV-2 viral copies this assay can detect is 250  copies / mL. A negative result does not preclude SARS-CoV-2 infection   and should not be used as the sole basis for treatment or other  patient management decisions.  A negative result may occur with  improper specimen collection / handling, submission of specimen other  than nasopharyngeal swab, presence of viral mutation(s) within the  areas targeted by this assay, and inadequate number of viral copies  (<250 copies / mL). A negative result must be combined with clinical  observations, patient history, and epidemiological information. If result is POSITIVE SARS-CoV-2 target nucleic acids are DETECTED. The SARS-CoV-2 RNA is generally detectable in upper and lower  respiratory specimens dur ing the acute phase of infection.  Positive  results are indicative of active infection with SARS-CoV-2.  Clinical  correlation with patient history and other diagnostic information is  necessary to determine patient infection status.  Positive results do  not rule out bacterial infection or co-infection with other viruses. If result is PRESUMPTIVE POSTIVE SARS-CoV-2 nucleic acids MAY BE PRESENT.   A presumptive positive result was obtained on the submitted specimen  and confirmed on repeat testing.  While 2019 novel coronavirus  (SARS-CoV-2) nucleic acids may be present in the submitted sample  additional confirmatory testing may be necessary for epidemiological  and / or clinical management purposes  to differentiate between  SARS-CoV-2 and other Sarbecovirus currently known to infect humans.  If clinically indicated additional testing with an alternate test  methodology [phone]) is advised. The SARS-CoV-2 RNA is generally  detectable in upper and lower respiratory sp ecimens during the acute  phase of infection. The expected  result is Negative. Fact Sheet for Patients:  StrictlyIdeas.no Fact Sheet for Healthcare Providers: BankingDealers.co.za This test is not yet approved or cleared by the [place] FDA  and has been authorized for detection and/or diagnosis of SARS-CoV-2 by FDA under an Emergency Use Authorization (EUA).  This EUA will remain in effect (meaning this test can be used) for the duration of the COVID-19 declaration under Section 564(b)(1) of the Act, 21 U.S.C. section 360bbb-3(b)(1), unless the authorization is terminated or revoked sooner. Performed at [facility], [address] [place], [place] [address]   Culture, respiratory (non-expectorated)     Status: None   Collection Time: 03/08/19 10:10 AM   Specimen: Tracheal Aspirate; Respiratory  Result Value Ref Range Status   Specimen Description   Final    TRACHEAL ASPIRATE Performed at [facility], [address] [place], [place] [address]    Special Requests   Final    NONE Performed at [facility], [address] [place], [place] [address]    Gram Stain   Final    RARE WBC PRESENT, PREDOMINANTLY PMN FEW GRAM POSITIVE RODS RARE GRAM POSITIVE COCCI RARE YEAST Performed at [facility], [place] [address] [place], [place] [address]    Culture   Final    MODERATE SERRATIA MARCESCENS MODERATE PSEUDOMONAS AERUGINOSA    Report Status 03/11/2019 FINAL  Final   Organism ID, Bacteria SERRATIA MARCESCENS  Final   Organism ID, Bacteria PSEUDOMONAS AERUGINOSA  Final      Susceptibility   Pseudomonas aeruginosa - MIC*    CEFTAZIDIME 4 SENSITIVE Sensitive     CIPROFLOXACIN 1 SENSITIVE Sensitive     GENTAMICIN 4 SENSITIVE Sensitive     IMIPENEM >=16 RESISTANT Resistant     PIP/TAZO 8 SENSITIVE Sensitive     CEFEPIME 16 INTERMEDIATE Intermediate     * MODERATE PSEUDOMONAS AERUGINOSA   Serratia marcescens - MIC*    CEFAZOLIN >=64 RESISTANT Resistant     CEFEPIME <=1 SENSITIVE Sensitive     CEFTAZIDIME <=1 SENSITIVE Sensitive     CEFTRIAXONE <=1 SENSITIVE Sensitive     CIPROFLOXACIN <=0.25 SENSITIVE Sensitive     GENTAMICIN <=1 SENSITIVE Sensitive     TRIMETH/SULFA <=20  SENSITIVE Sensitive     * MODERATE SERRATIA MARCESCENS  MRSA PCR Screening     Status: None   Collection Time: 03/08/19  3:13 PM   Specimen: Nasopharyngeal  Result Value Ref Range Status   MRSA by PCR NEGATIVE NEGATIVE Final    Comment:        The GeneXpert MRSA Assay (FDA approved for NASAL specimens only), is one component of a comprehensive MRSA colonization surveillance program. It is not intended to diagnose MRSA infection nor to guide or monitor treatment for MRSA infections. Performed at [facility], [address] [place], [place] [address]   CULTURE, BLOOD (ROUTINE X 2) w Reflex to ID Panel     Status: None   Collection Time: 03/08/19  3:50 PM   Specimen: BLOOD  Result Value Ref Range Status   Specimen Description BLOOD LEFT ANTECUBITAL  Final   Special Requests   Final    BOTTLES DRAWN AEROBIC AND ANAEROBIC Blood Culture adequate volume   Culture   Final    NO GROWTH 5 DAYS Performed at [facility], [address] [place],  [address]    Report Status 03/13/2019 FINAL  Final  CULTURE, BLOOD (ROUTINE X 2) w Reflex to ID Panel  Status: None   Collection Time: 03/08/19  3:50 PM   Specimen: BLOOD  Result Value Ref Range Status   Specimen Description BLOOD BLOOD RIGHT HAND  Final   Special Requests   Final    BOTTLES DRAWN AEROBIC AND ANAEROBIC Blood Culture results may not be optimal due to an inadequate volume of blood received in culture bottles   Culture   Final    NO GROWTH 5 DAYS Performed at [facility], [address] [place], [place] [address]    Report Status 03/13/2019 FINAL  Final        Current Facility-Administered Medications (Endocrine & Metabolic):  .  insulin aspart (novoLOG) injection 0-15 Units .  insulin aspart (novoLOG) injection 3 Units .  insulin glargine (LANTUS) injection 10 Units .  predniSONE (DELTASONE) tablet 20 mg   Current Facility-Administered Medications (Cardiovascular):  .   amLODipine (NORVASC) tablet 5 mg .  atorvastatin (LIPITOR) tablet 20 mg .  furosemide (LASIX) injection 20 mg .  labetalol (NORMODYNE) injection 10 mg   Current Facility-Administered Medications (Respiratory):  .  fluticasone (FLONASE) 50 MCG/ACT nasal spray 1 spray .  ipratropium-albuterol (DUONEB) 0.5-2.5 (3) MG/3ML nebulizer solution 3 mL .  loratadine (CLARITIN) tablet 10 mg   Current Facility-Administered Medications (Analgesics):  .  acetaminophen (TYLENOL) tablet 650 mg **OR** acetaminophen (TYLENOL) suppository 650 mg .  aspirin chewable tablet 81 mg .  fentaNYL (DURAGESIC) 50 MCG/HR 1 patch .  fentaNYL (SUBLIMAZE) injection 25 mcg   Current Facility-Administered Medications (Hematological):  .  enoxaparin (LOVENOX) injection 40 mg   Current Facility-Administered Medications (Other):  Marland Kitchen  Chlorhexidine Gluconate Cloth 2 % PADS 6 each .  ciprofloxacin (CIPRO) tablet 750 mg .  clonazePAM (KLONOPIN) tablet 2 mg .  famotidine (PEPCID) tablet 20 mg .  feeding supplement (PRO-STAT SUGAR FREE 64) liquid 30 mL .  feeding supplement (VITAL 1.5 CAL) liquid 1,000 mL .  LORazepam (ATIVAN) injection 1 mg .  mirtazapine (REMERON) tablet 30 mg .  multivitamin liquid 15 mL .  ondansetron (ZOFRAN) tablet 4 mg **OR** ondansetron (ZOFRAN) injection 4 mg .  PARoxetine (PAXIL) tablet 20 mg .  polyethylene glycol (MIRALAX / GLYCOLAX) packet 17 g .  QUEtiapine (SEROQUEL) tablet 25 mg .  senna (SENOKOT) tablet 8.6-17.2 mg .  white petrolatum (VASELINE) gel  No current outpatient medications on file.        ASSESSMENT AND PLAN SYNOPSIS   Severe ACUTE Hypoxic and Hypercapnic Respiratory Failure end stage  COPD -continue  MV support -continue Bronchodilator Therapy -Wean Fio2 and PEEP as tolerated   CARDIAC ICU monitoring  ID -continue IV abx as prescibed -follow up cultures - Serratia marcescens and Pseudomonas aeruginosa pneumonia  GI GI PROPHYLAXIS as  indicated  NUTRITIONAL STATUS DIET-->NPO Constipation protocol as indicated  ENDO - will use ICU hypoglycemic\Hyperglycemia protocol if indicated   ELECTROLYTES -follow labs as needed -replace as needed -pharmacy consultation and following   DVT/GI PRX ordered TRANSFUSIONS AS NEEDED MONITOR FSBS ASSESS the need for LABS as needed   PLAN TO D/C HOME and [place].

## 2019-03-14 NOTE — Consult Note (Signed)
Pharmacy Antibiotic Note  Michelle [name] is a 58 y.o. female admitted on 03/08/2019 with pneumonia. Patient admitted for acute on chronic hypoxic respiratory failure secondary to COPD exacerbation/PNA. She was initially started on azithromycin monotherapy which was converted to ceftriaxone after Serratia was noted to be growing in tracheal aspirate culture. Ceftriaxone was switch to cefepime after culture update showed additional PSA. Pseudomonas aeruginosa susceptibilities resulted indicating intermediate susceptibility to cefepime. Therapy altered to ciprofloxacin monotherapy.Converting route of ciprofloxacin to PO via tube. Pharmacy has been consulted for ciprofloxacin dosing.  Patient with trach on ventilator. Patient with PCT of <0.10. WBC of 12.3, value trending up. CBC w/differential showed left shift. Afebrile last 24h with recent temp of 97.68F.   Plan: Ciprofloxacin IV to PO conversion -400 mg IV q8h= 750 mg tablet q12h (per package insert) -Continue Ciprofloxacin 750 mg q12hr via tube, day 4 of therapy -higher dosing range chosen due to MIC of 1 for pseudomonas aeruginosa -Monitor QTc, latest ECG showed QTc of 513. Patient is also on quetiapine.  Continue to monitor cultures, renal function, QTc, and clinical status for signs of improvement with antibiotics.   Height: 5' (152.4 cm) Weight: 189 lb 6 oz (85.9 kg) IBW/kg (Calculated) : 45.5  Temp (24hrs), Avg:97.9 F (36.6 C), Min:97.7 F (36.5 C), Max:98.1 F (36.7 C)  Recent Labs  Lab 03/08/19 1526 03/09/19 0626 03/11/19 0532 03/12/19 0410 03/13/19 0404 03/14/19 0603  WBC  --  17.8* 11.9* 9.6 12.3* 16.1*  CREATININE  --  0.80 0.94 0.78 0.70 0.70  LATICACIDVEN 1.5  --   --   --   --   --     Estimated Creatinine Clearance: 75.6 mL/min (by C-G formula based on SCr of 0.7 mg/dL).    Allergies  Allergen Reactions  . Amoxicillin     Patient has tolerated cephalosporins in the past    Antimicrobials this admission: Doxy  10/4 x1 Azithromycin 10/4 >> 10/5 Ceftriaxone 10/6 >> 10/6 Cefepime 10/6 >>10/7 Ciprofloxacin 10/7 >>  Microbiology results:  10/4 Tracheal aspirate: moderate serratia marcescens, moderate pseudomonas aeruginosa (intermediate to cefepime)-- both bugs susceptible to cipro, rare gram positive cocci 10/4 BCx: No growth x 5 days   10/4 MRSA PCR: negative 10/4 COVID 19: negative  Thank you for allowing pharmacy to be a part of this patient's care.  Oswald Hillock PharmD, BCPS 03/14/2019 7:37 AM

## 2019-03-14 NOTE — TOC Transition Note (Addendum)
Transition of Care [facility]) - CM/SW Discharge Note   Patient Details  Name: Michelle [name] MRN: [id number] Date of Birth: 1961-01-01  Transition of Care [facility]) CM/SW Contact:  Marshell Garfinkel, RN Phone Number: 03/14/2019, 11:51 AM   Clinical Narrative:     Patient ready for discharge to home today. RNCM spoke with [name] with EMS and they will transport with her Trilogy in place. RNCM spoke with [name] and both are ready to receive patient. They share that [facility] has nursing shortage and I explained that [facility] is arranged through [facility] and any changes will need to go through [place]- she agreed and was not aware or didn't remember [place] talking about that at time of original discharge on trach/vent.  ICU updated. [name] said she would call [facility] nurse to notify of discharge.   Final next level of care: [place] Barriers to Discharge: No Barriers Identified   Patient Goals and CMS Choice        Discharge Placement                       Discharge Plan and Services                                     Social Determinants of Health (SDOH) Interventions     Readmission Risk Interventions Readmission Risk Prevention Plan 09/19/2018  Transportation Screening Complete  PCP or Specialist Appt within 5-7 Days Complete  Home Care Screening Complete  Medication Review (RN CM) Complete  Some recent data might be hidden

## 2019-03-15 IMAGING — DX DG CHEST 1V PORT
1 series · 1 of 1 positions shown · non-contrast
Comparison: Eight days ago

CLINICAL DATA: Acute respiratory failure

EXAM:
PORTABLE CHEST 1 VIEW

[chest ap]
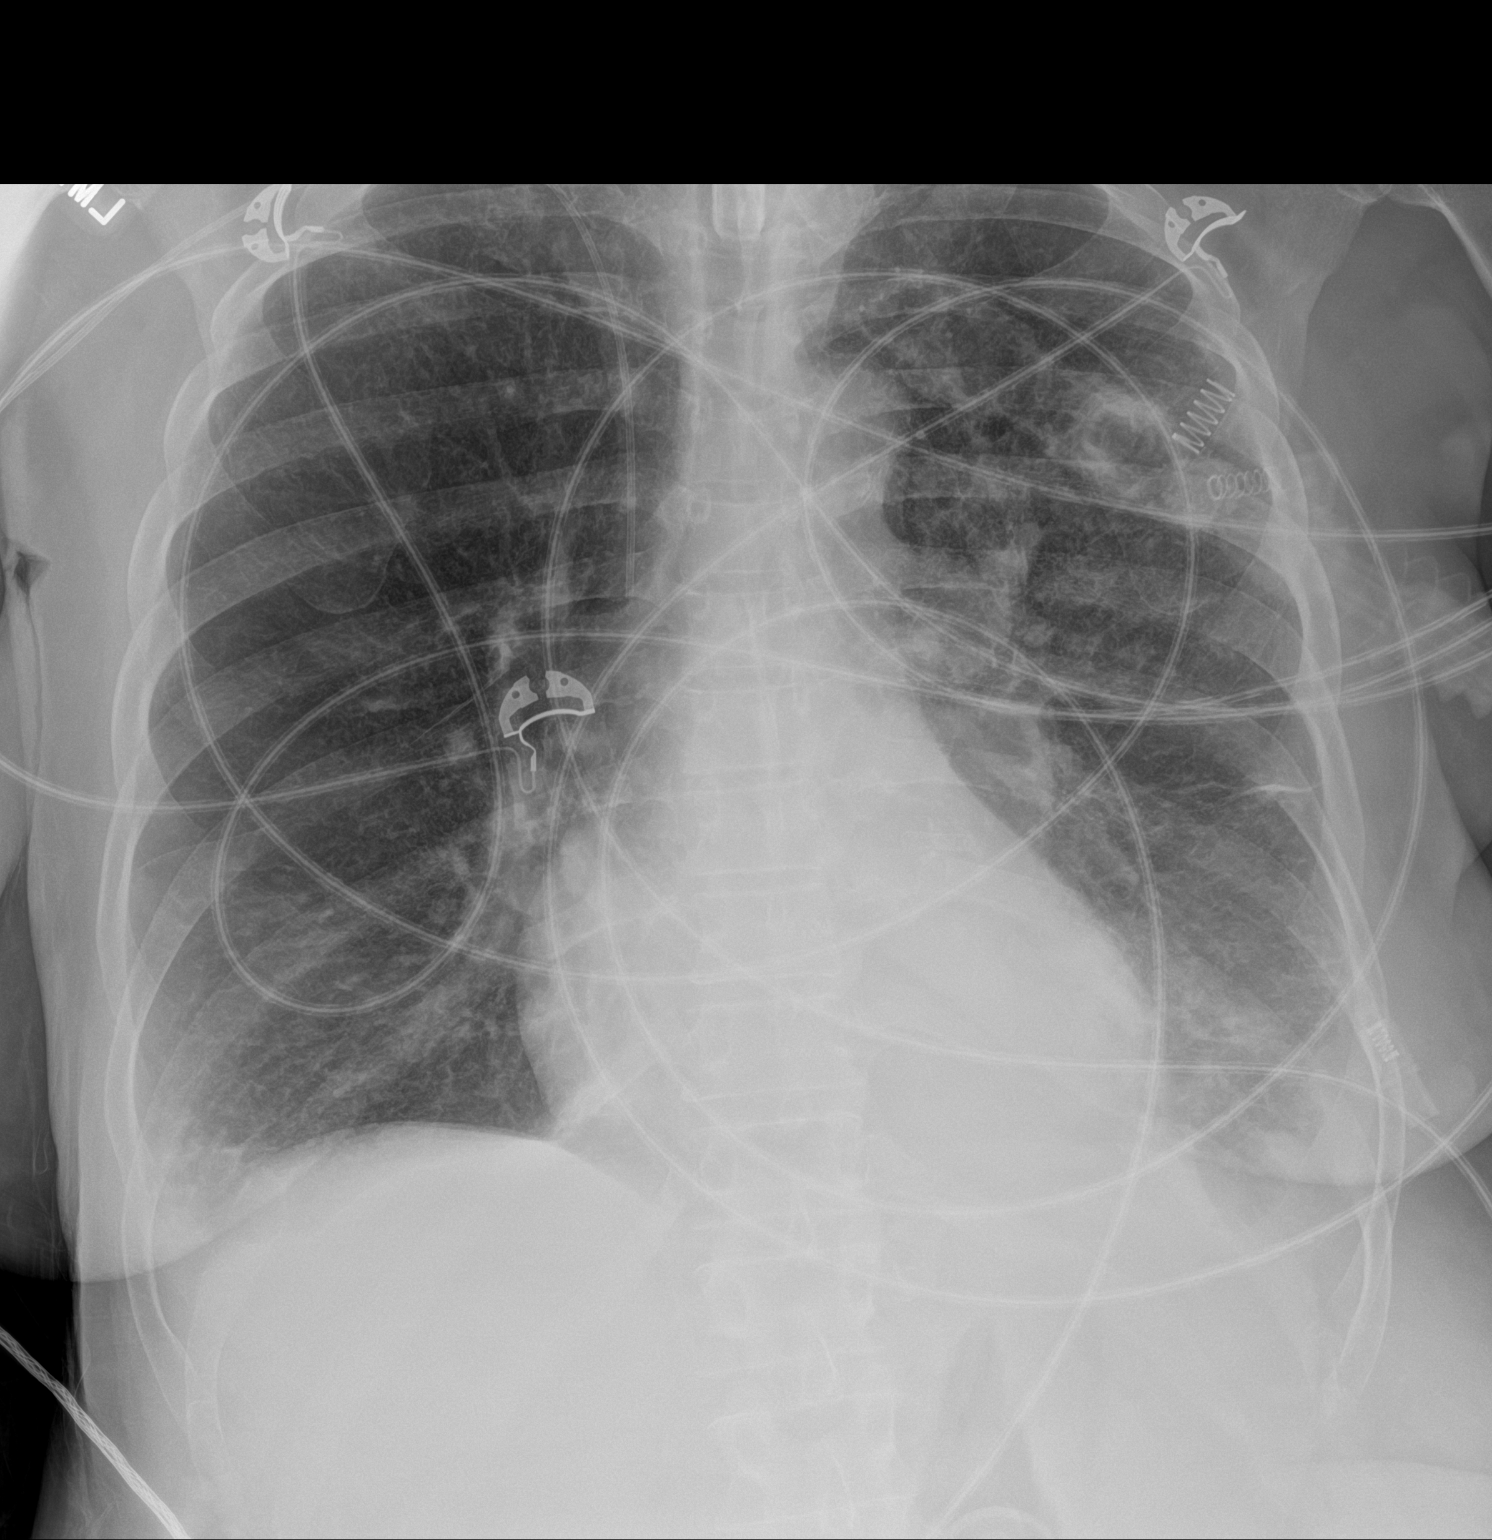

[1 of 1 positions shown; findings below may reference images not displayed]

FINDINGS: Trace bilateral pleural effusion as seen previously. There is
generalized interstitial coarsening also parenchymal opacity behind
the heart. The left lower lobe was clear on a chest CT 3 months ago.
No pneumothorax. Right upper extremity PICC in good position with
tip at the SVC. A tracheostomy tube is well seated. Artifact from
EKG leads.
IMPRESSION: 1. Stable compared to 11/22/2017.
2. Trace effusions and interstitial coarsening from mild edema or
patient's COPD.
3. Asymmetric retrocardiac opacity that could be infection or
atelectasis.

## 2019-03-19 IMAGING — DX DG CHEST 1V PORT
1 series · 1 of 1 positions shown · non-contrast
Comparison: Chest x-ray dated November 30, 2017.

CLINICAL DATA: Increased tracheal secretions.

EXAM:
PORTABLE CHEST 1 VIEW

[chest ap]
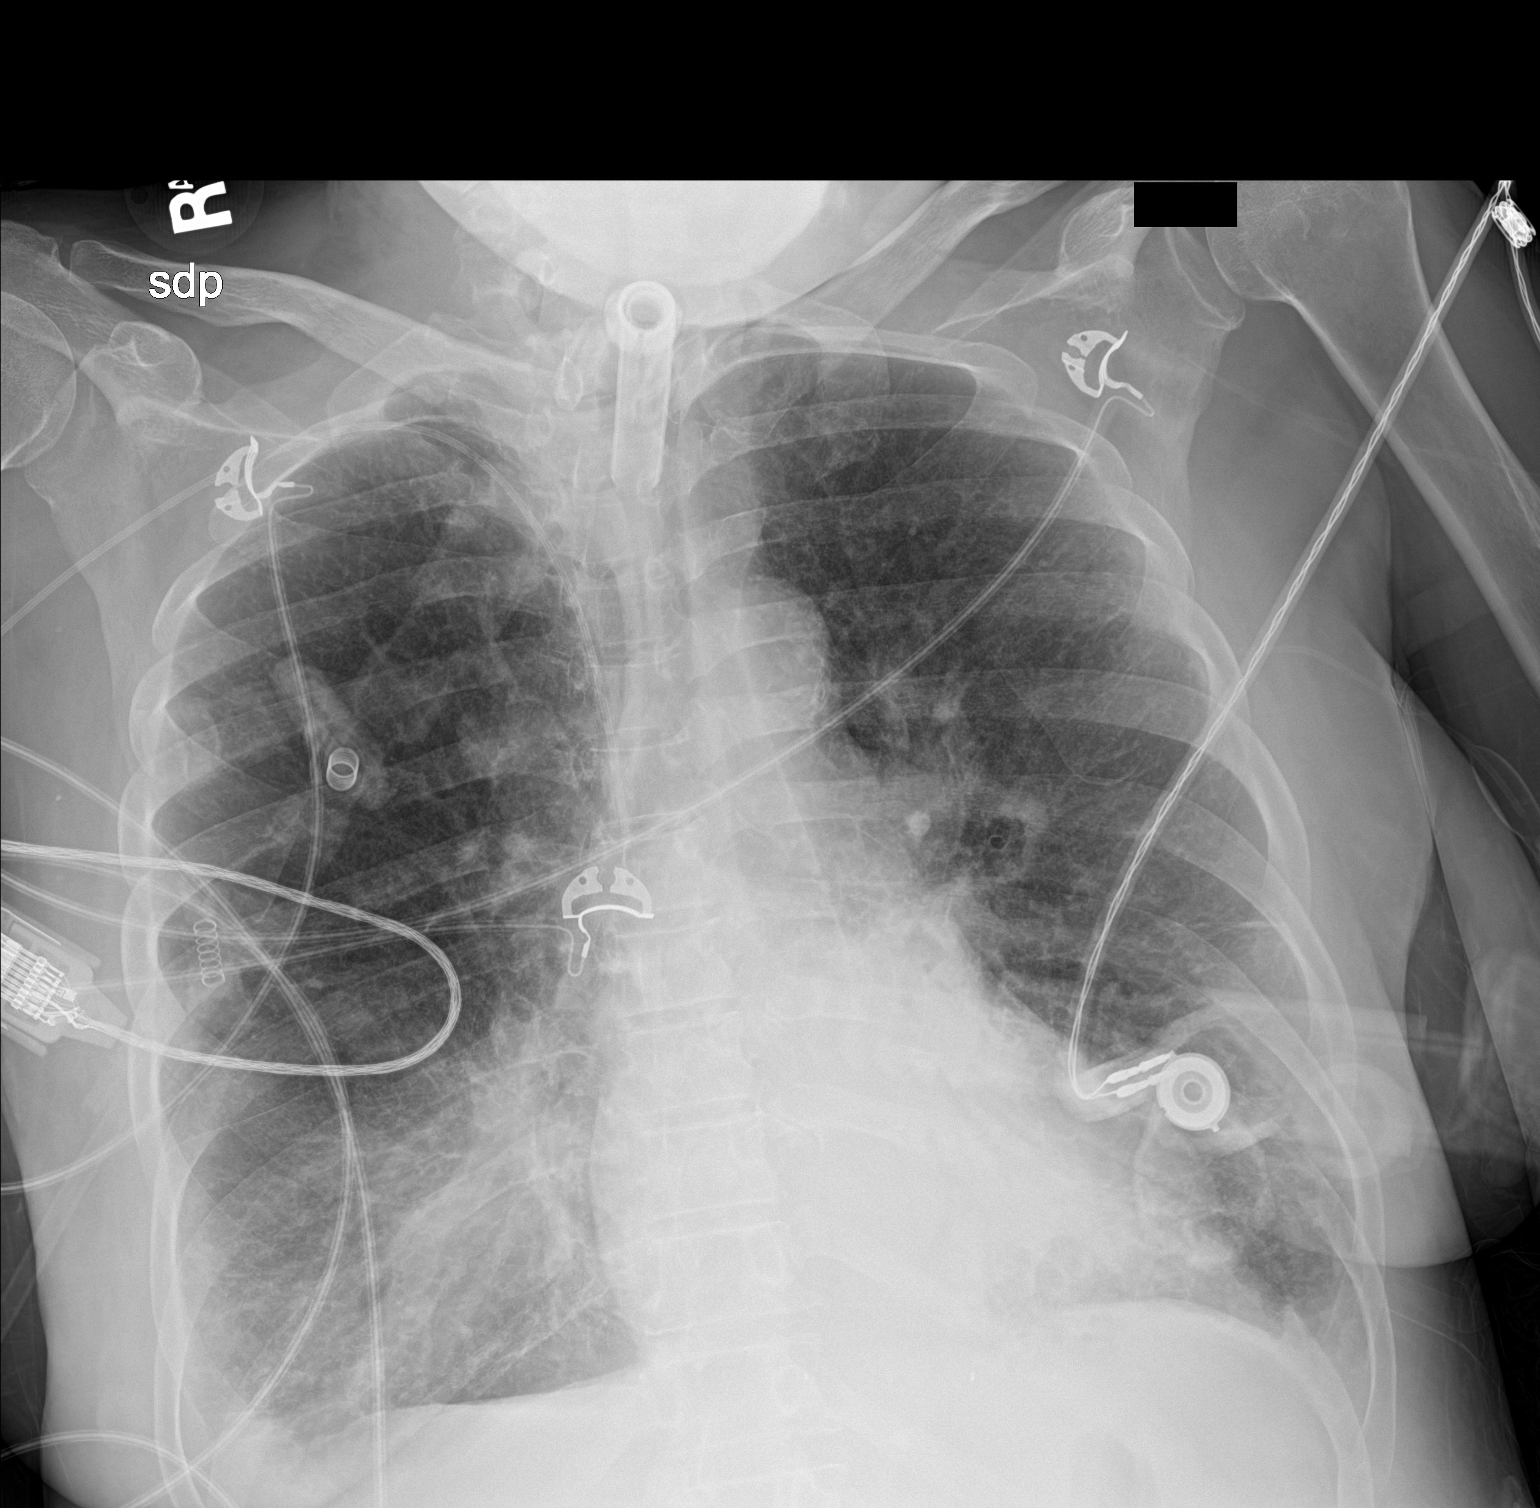

[1 of 1 positions shown; findings below may reference images not displayed]

FINDINGS: Unchanged tracheostomy tube and right upper extremity PICC line. The
heart size and mediastinal contours are within normal limits.
Coarsened interstitial markings are similar to prior study. The
lungs remain hyperinflated. New hazy density in the right lower
lobe. Unchanged retrocardiac opacity. Unchanged small bilateral
pleural effusions. No pneumothorax. No acute osseous abnormality.
IMPRESSION: 1. New hazy density at the right lung base could reflect edema or
infection.
2. Unchanged retrocardiac airspace disease.
3. Stable small bilateral pleural effusions.
4. COPD.

## 2019-03-21 IMAGING — DX DG ANKLE PORT 2V*R*
3 series · 3 of 3 positions shown · non-contrast
Comparison: No recent.

CLINICAL DATA: Right ankle pain.

EXAM:
PORTABLE RIGHT ANKLE - 2 VIEW

[ankle ap]
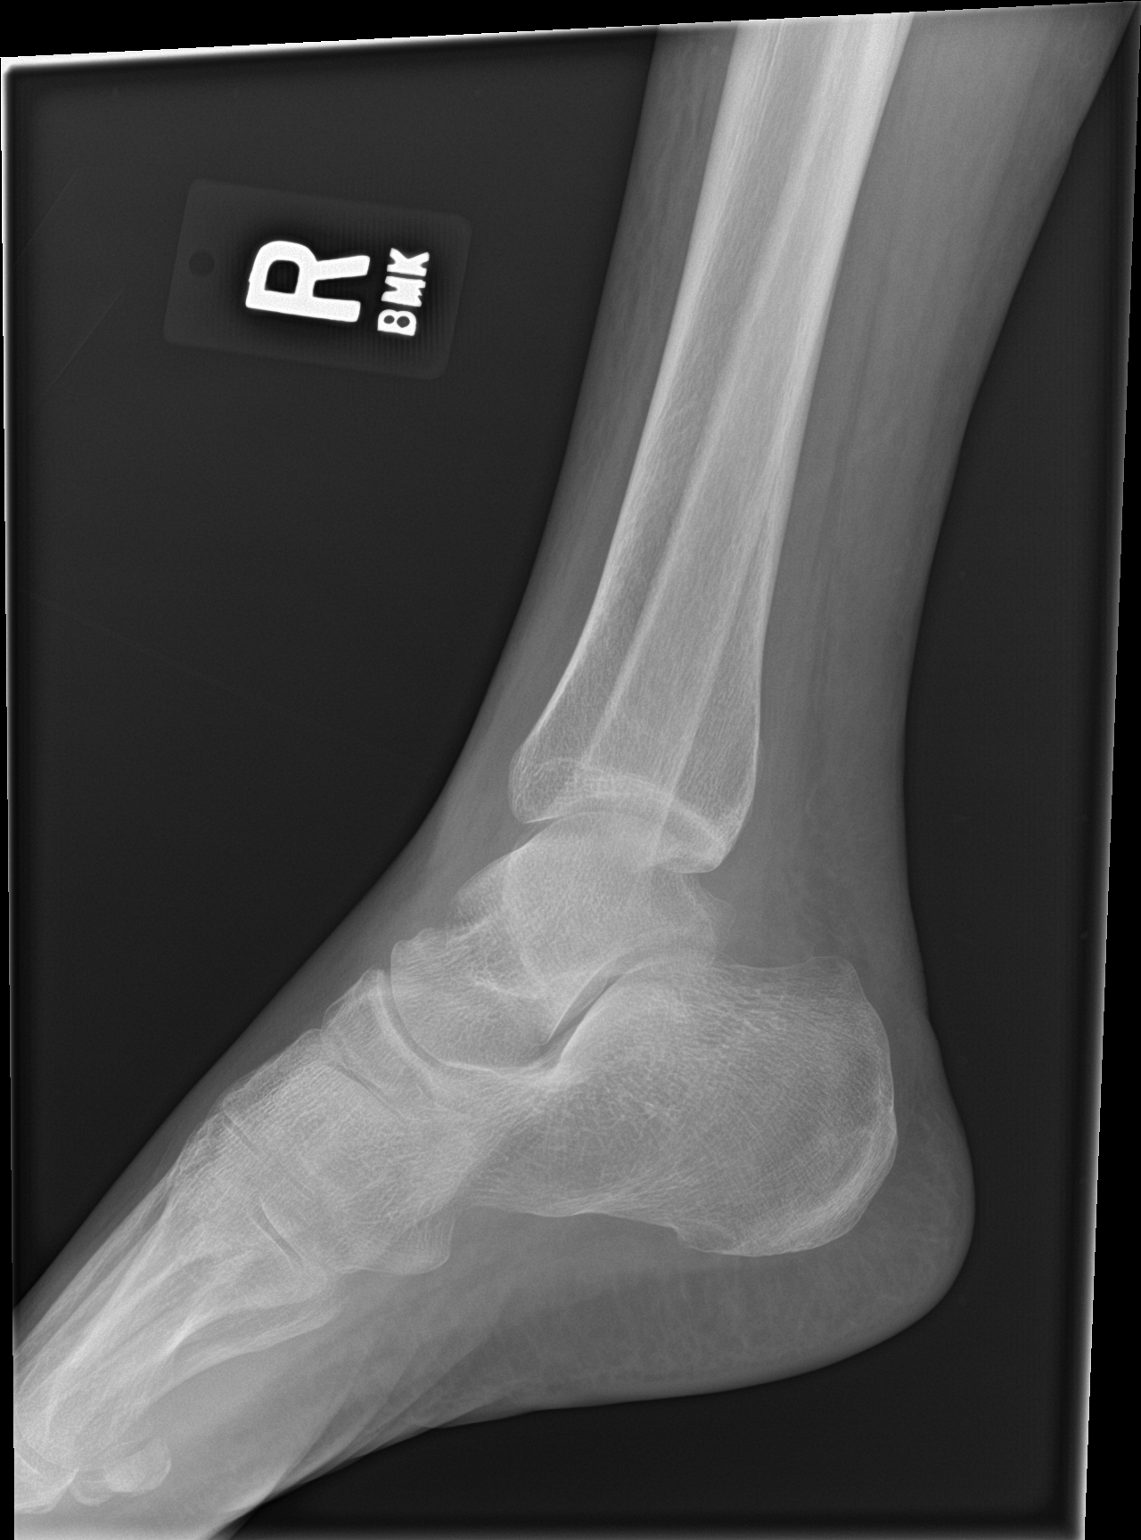

[ankle lat]
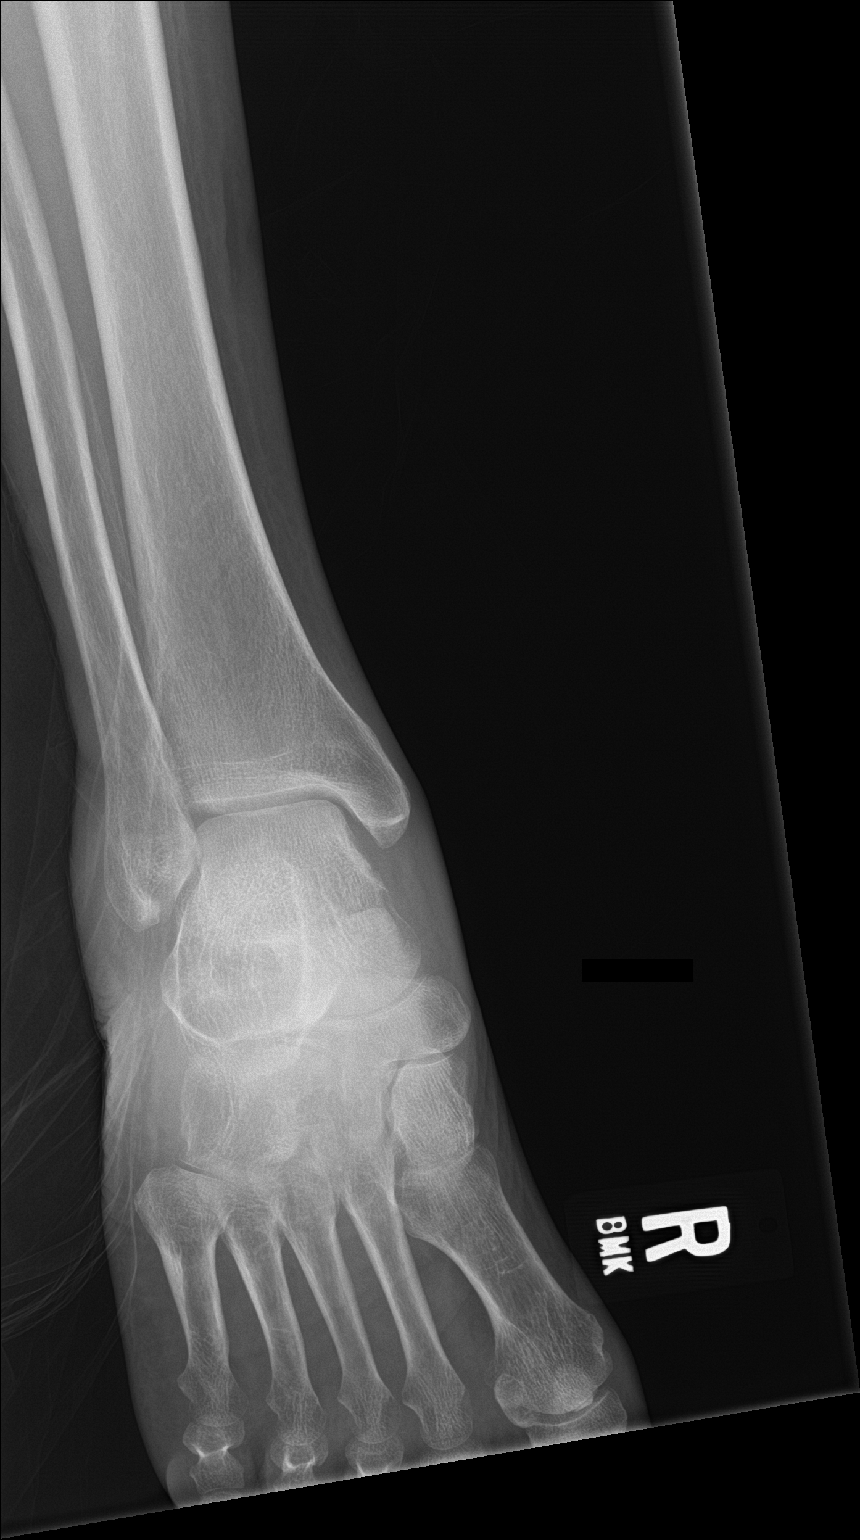

[ankle obl]
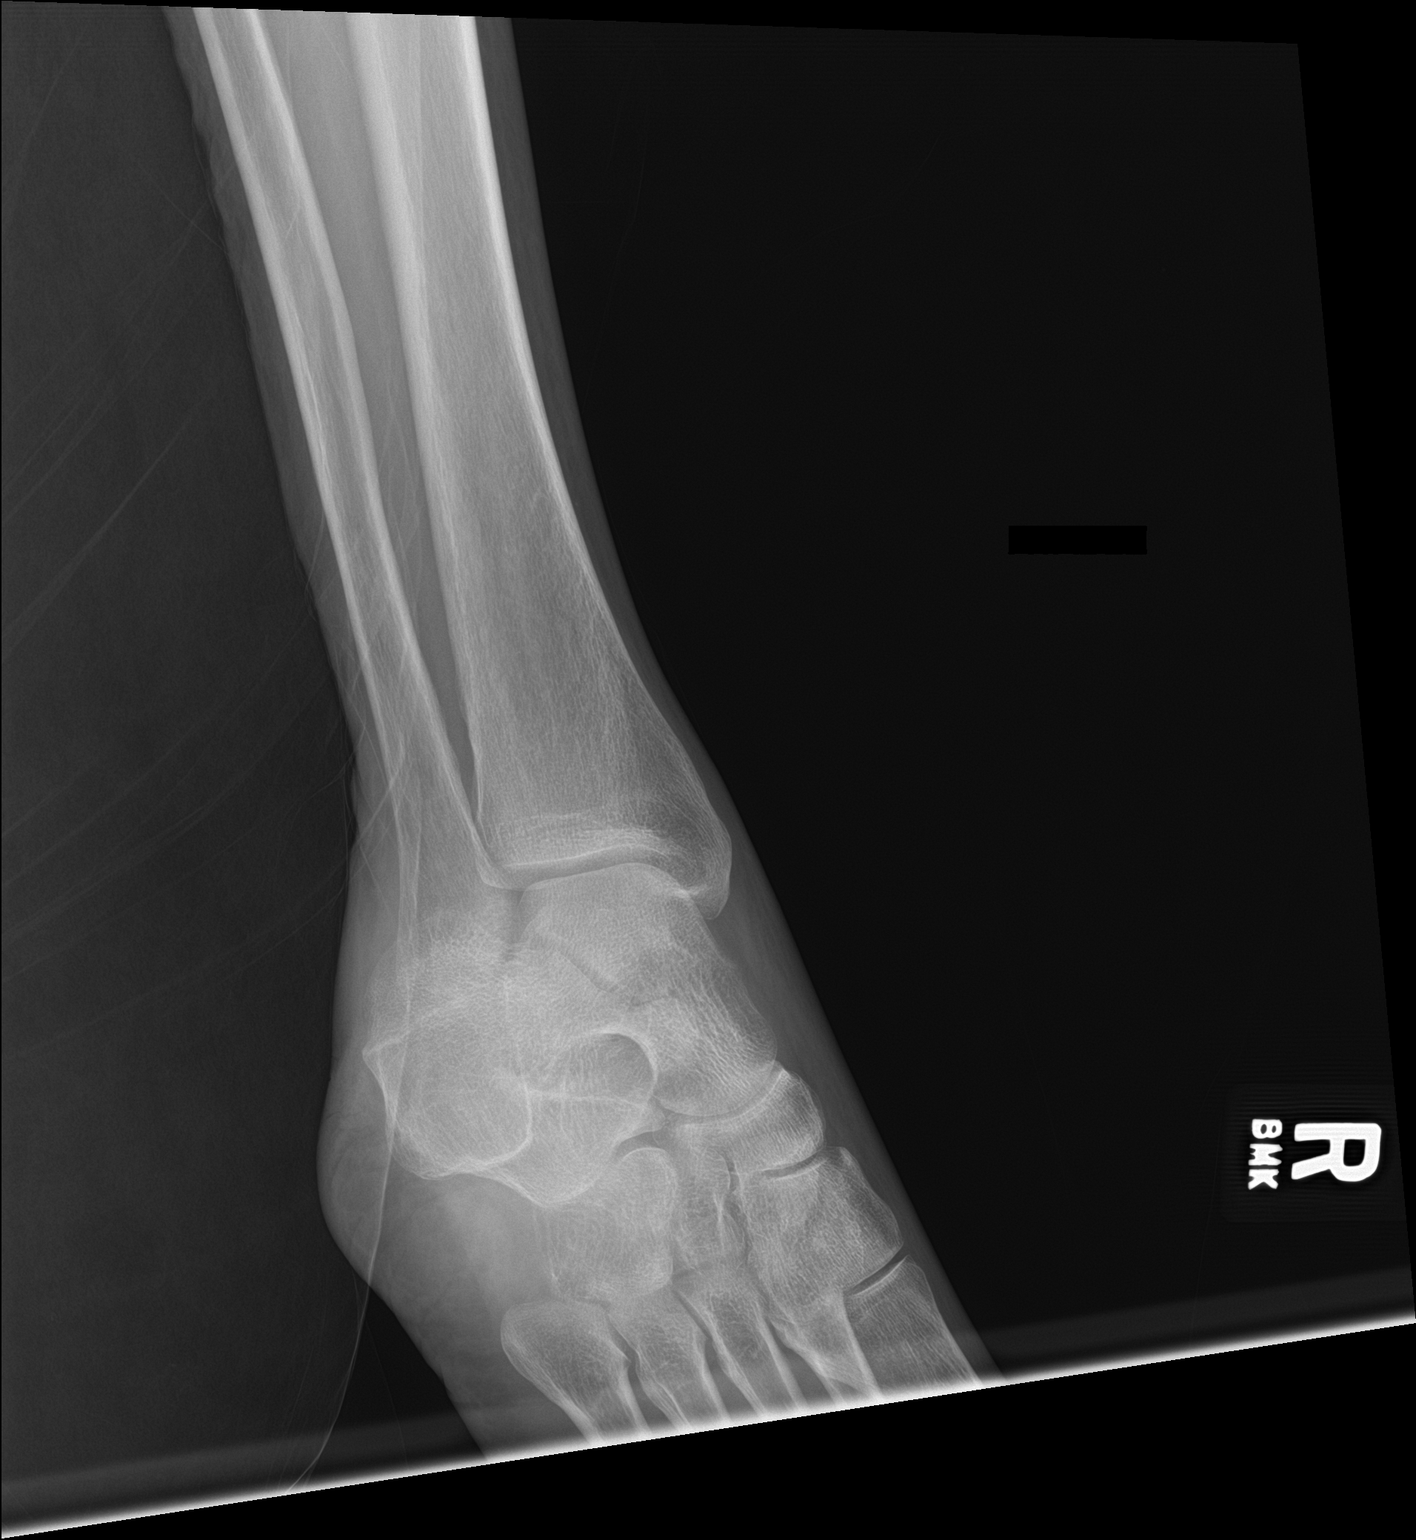

[3 of 3 positions shown; findings below may reference images not displayed]

FINDINGS: Soft tissue swelling noted about the right ankle. No evidence of
fracture or dislocation. No acute bony abnormality identified.
IMPRESSION: Diffuse soft tissue swelling.  No acute bony abnormality.

## 2019-03-22 IMAGING — DX DG CHEST 1V PORT
1 series · 1 of 1 positions shown · non-contrast
Comparison: December 04, 2017

CLINICAL DATA: Acute respiratory failure

EXAM:
PORTABLE CHEST 1 VIEW

[chest ap]
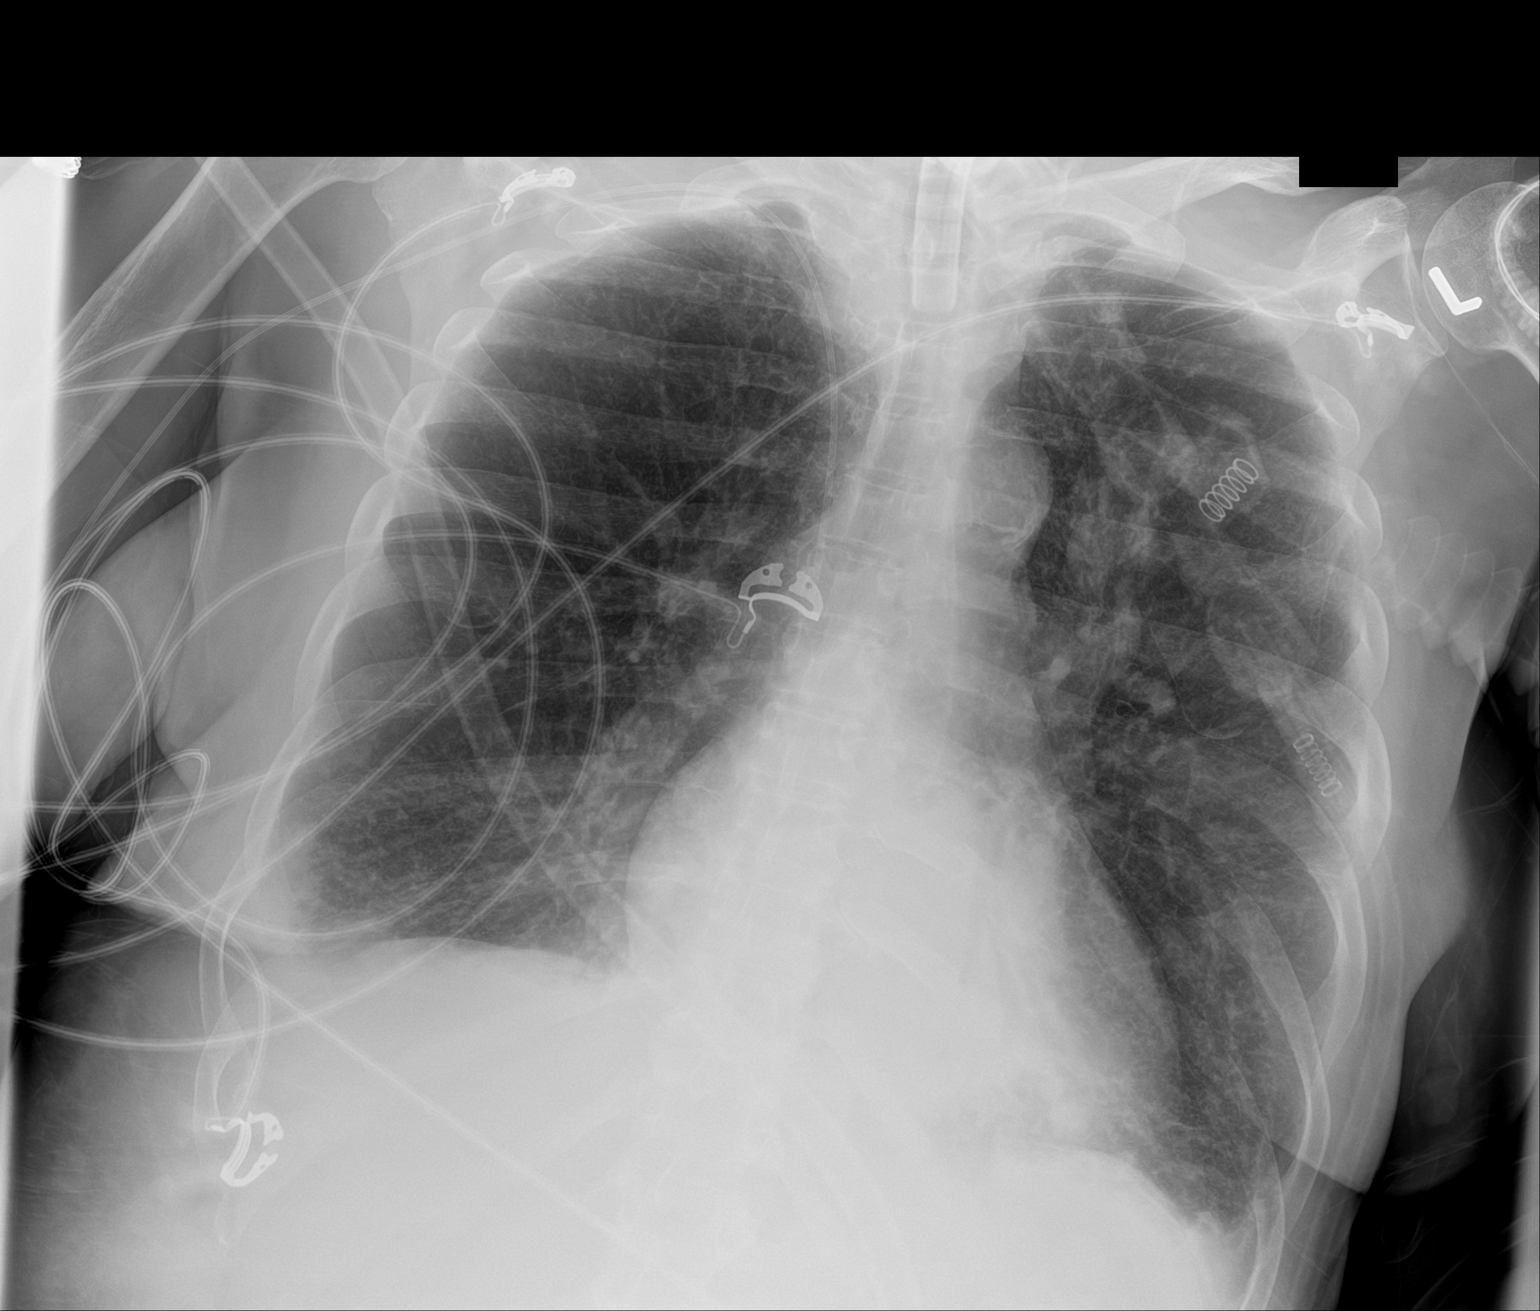

[1 of 1 positions shown; findings below may reference images not displayed]

FINDINGS: Right PICC line and tracheostomy tube are in good position. No
pneumothorax. Small bilateral pleural effusions persist. The opacity
in the right base seen previously has resolved. Retrocardiac opacity
is stable. Mild pulmonary venous congestion.
IMPRESSION: 1. Stable support apparatus as above.
2. Persistent left retrocardiac opacity.
3. Stable small pleural effusions.
4. Resolution of right basilar opacity.
5. Pulmonary venous congestion.

## 2019-03-25 IMAGING — DX DG CHEST 1V PORT
1 series · 1 of 1 positions shown · non-contrast
Comparison: Radiograph December 07, 2017.

CLINICAL DATA: Acute respiratory failure.

EXAM:
PORTABLE CHEST 1 VIEW

[chest ap]
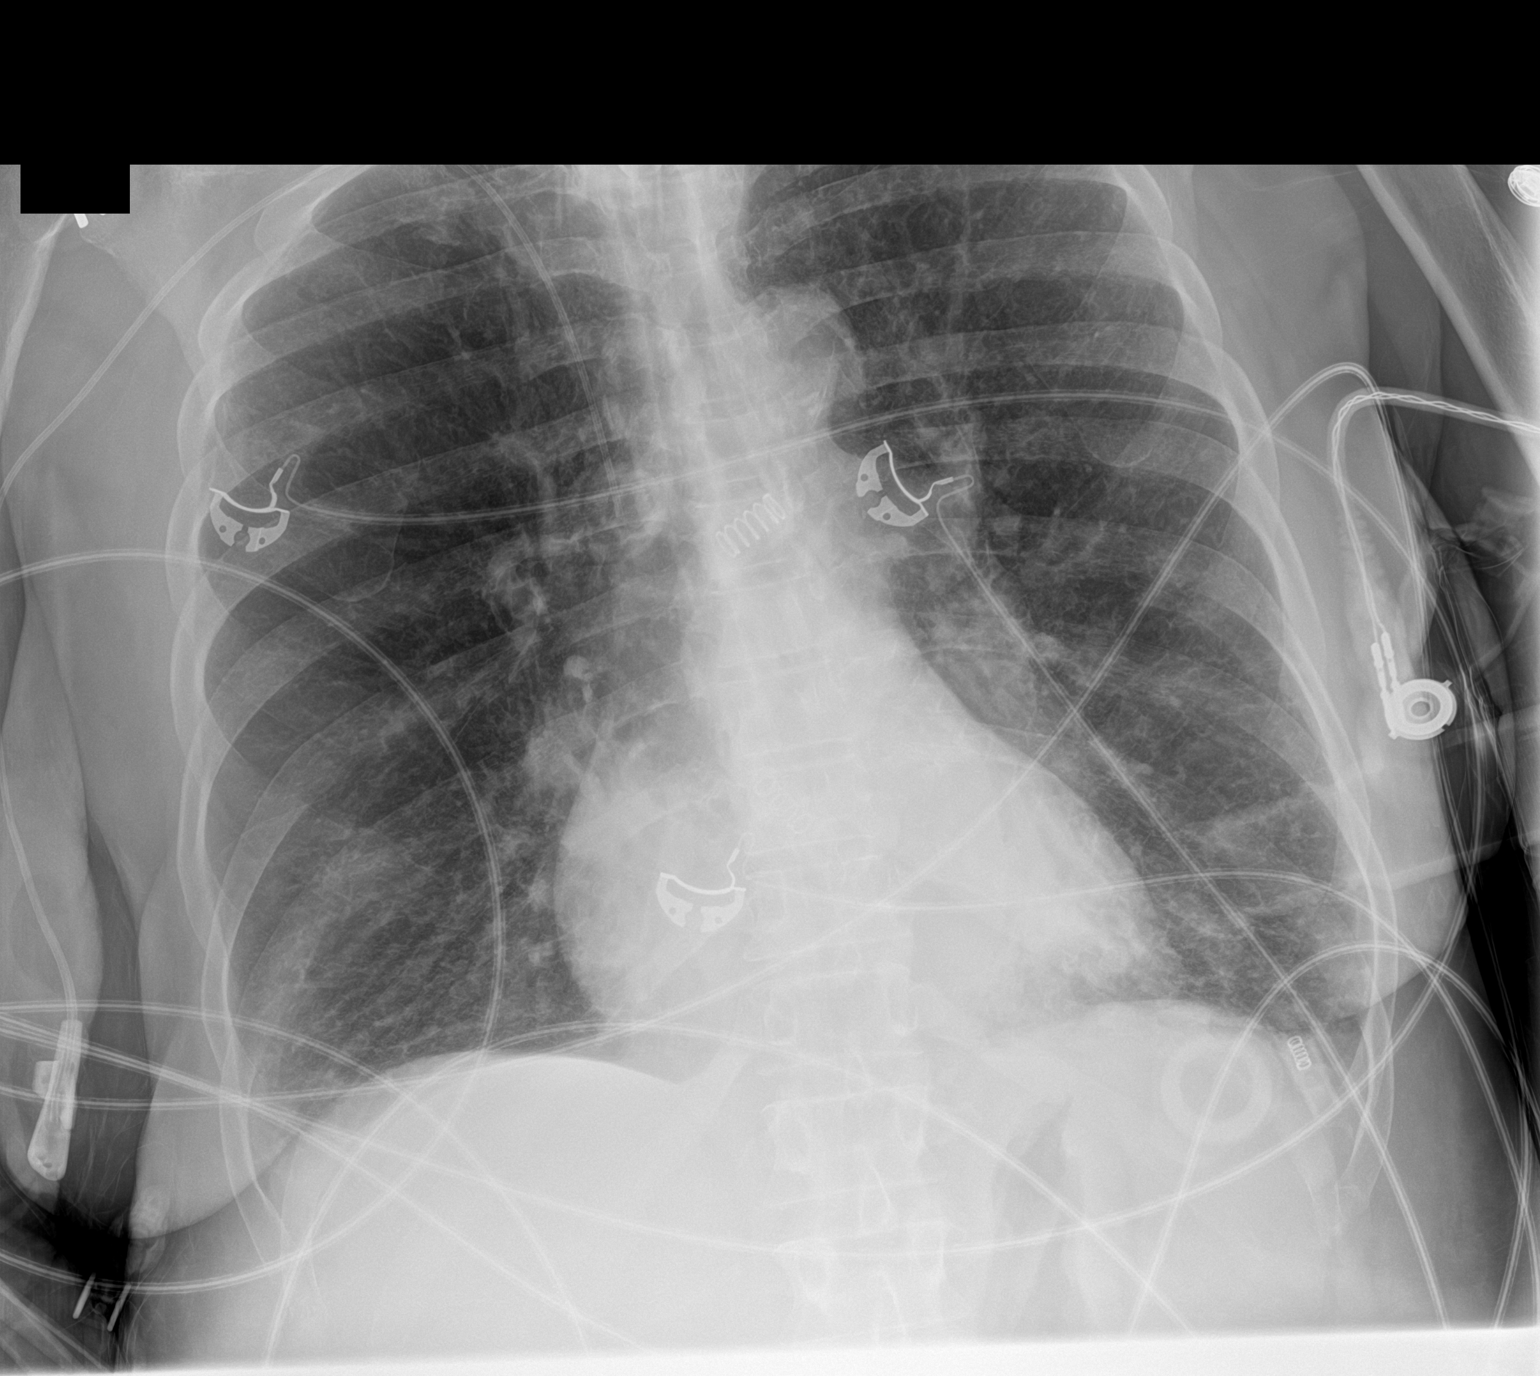

[1 of 1 positions shown; findings below may reference images not displayed]

FINDINGS: The heart size and mediastinal contours are within normal limits.
Right-sided PICC line and tracheostomy tubes are unchanged in
position. No pneumothorax or pleural effusion is noted. Minimal
bibasilar subsegmental atelectasis is noted. The visualized skeletal
structures are unremarkable.
IMPRESSION: Stable support apparatus. Minimal bibasilar subsegmental
atelectasis.

## 2019-03-27 ENCOUNTER — Telehealth: Payer: Self-pay

## 2019-03-27 NOTE — Telephone Encounter (Signed)
Phone call placed to patient's daughter to check in and to offer to schedule a follow up visit with Palliative Care. VM left

## 2019-03-29 IMAGING — DX DG CHEST 1V PORT
2 series · 2 of 2 positions shown · non-contrast
Comparison: December 10, 2017

CLINICAL DATA: Respiratory failure

EXAM:
PORTABLE CHEST 1 VIEW

[chest ap (1 of 2)]
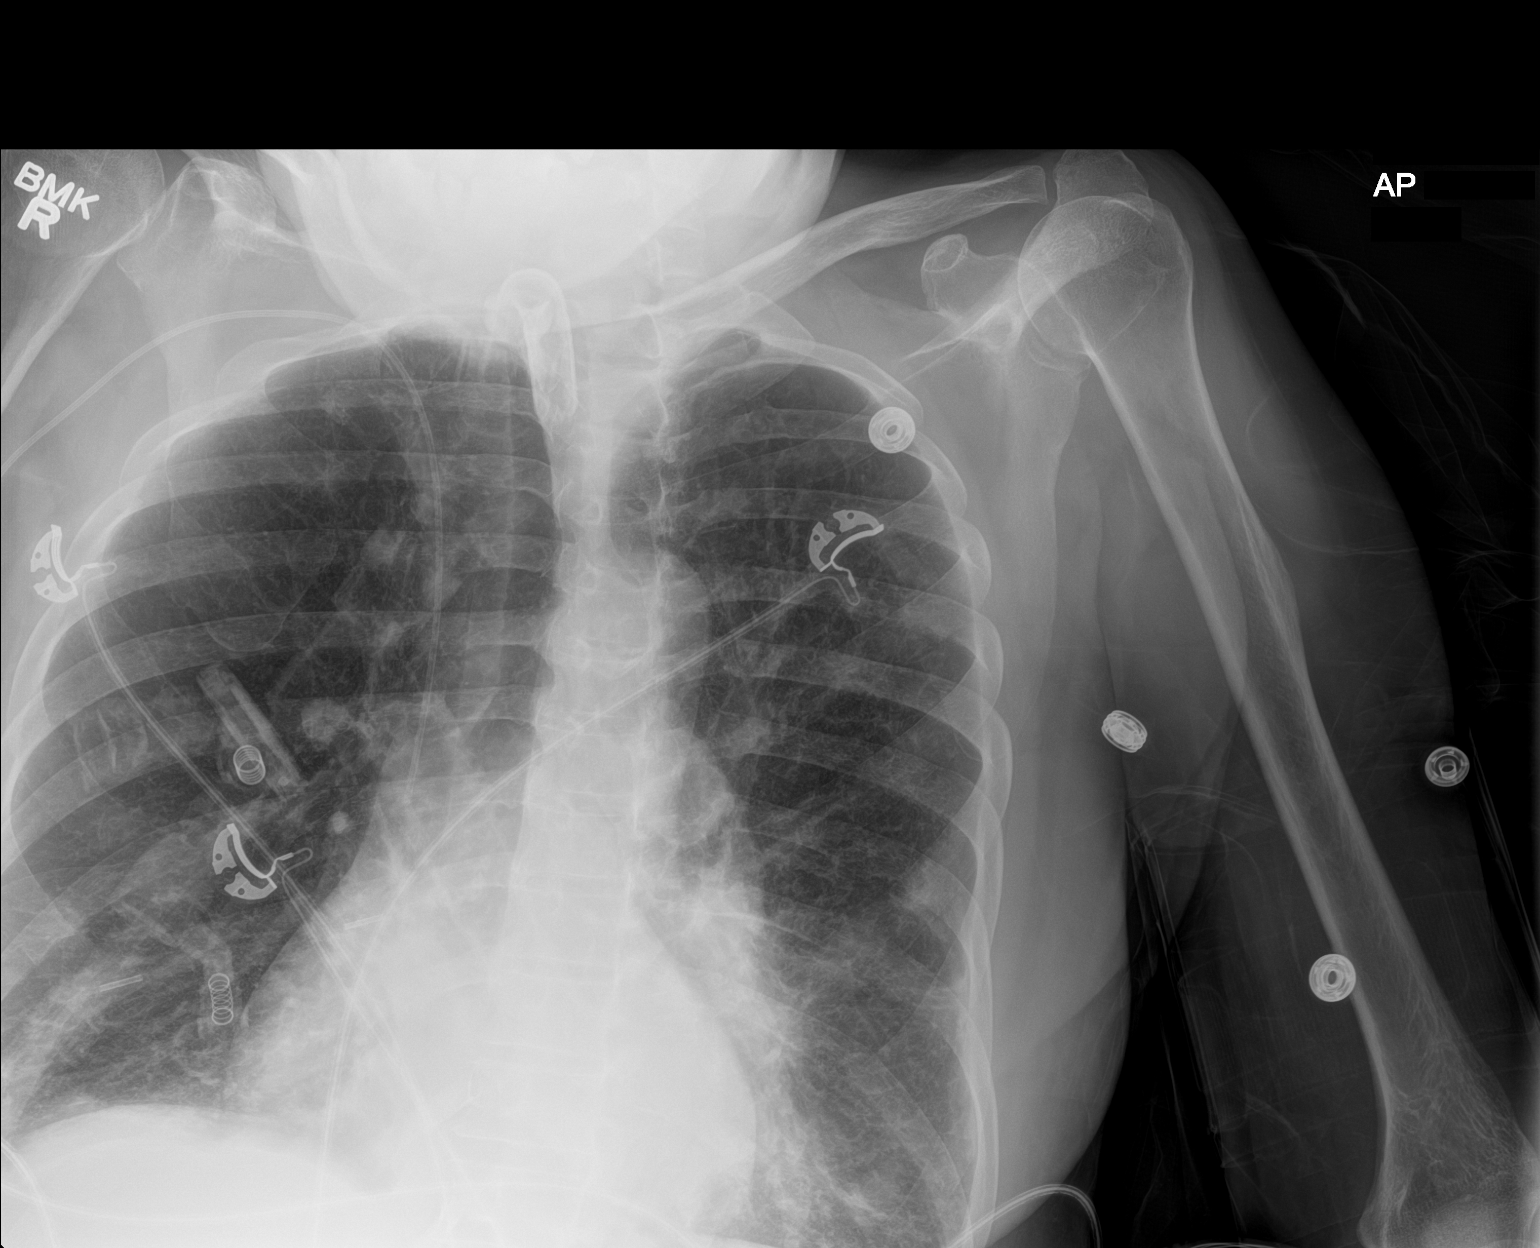

[chest ap (2 of 2)]
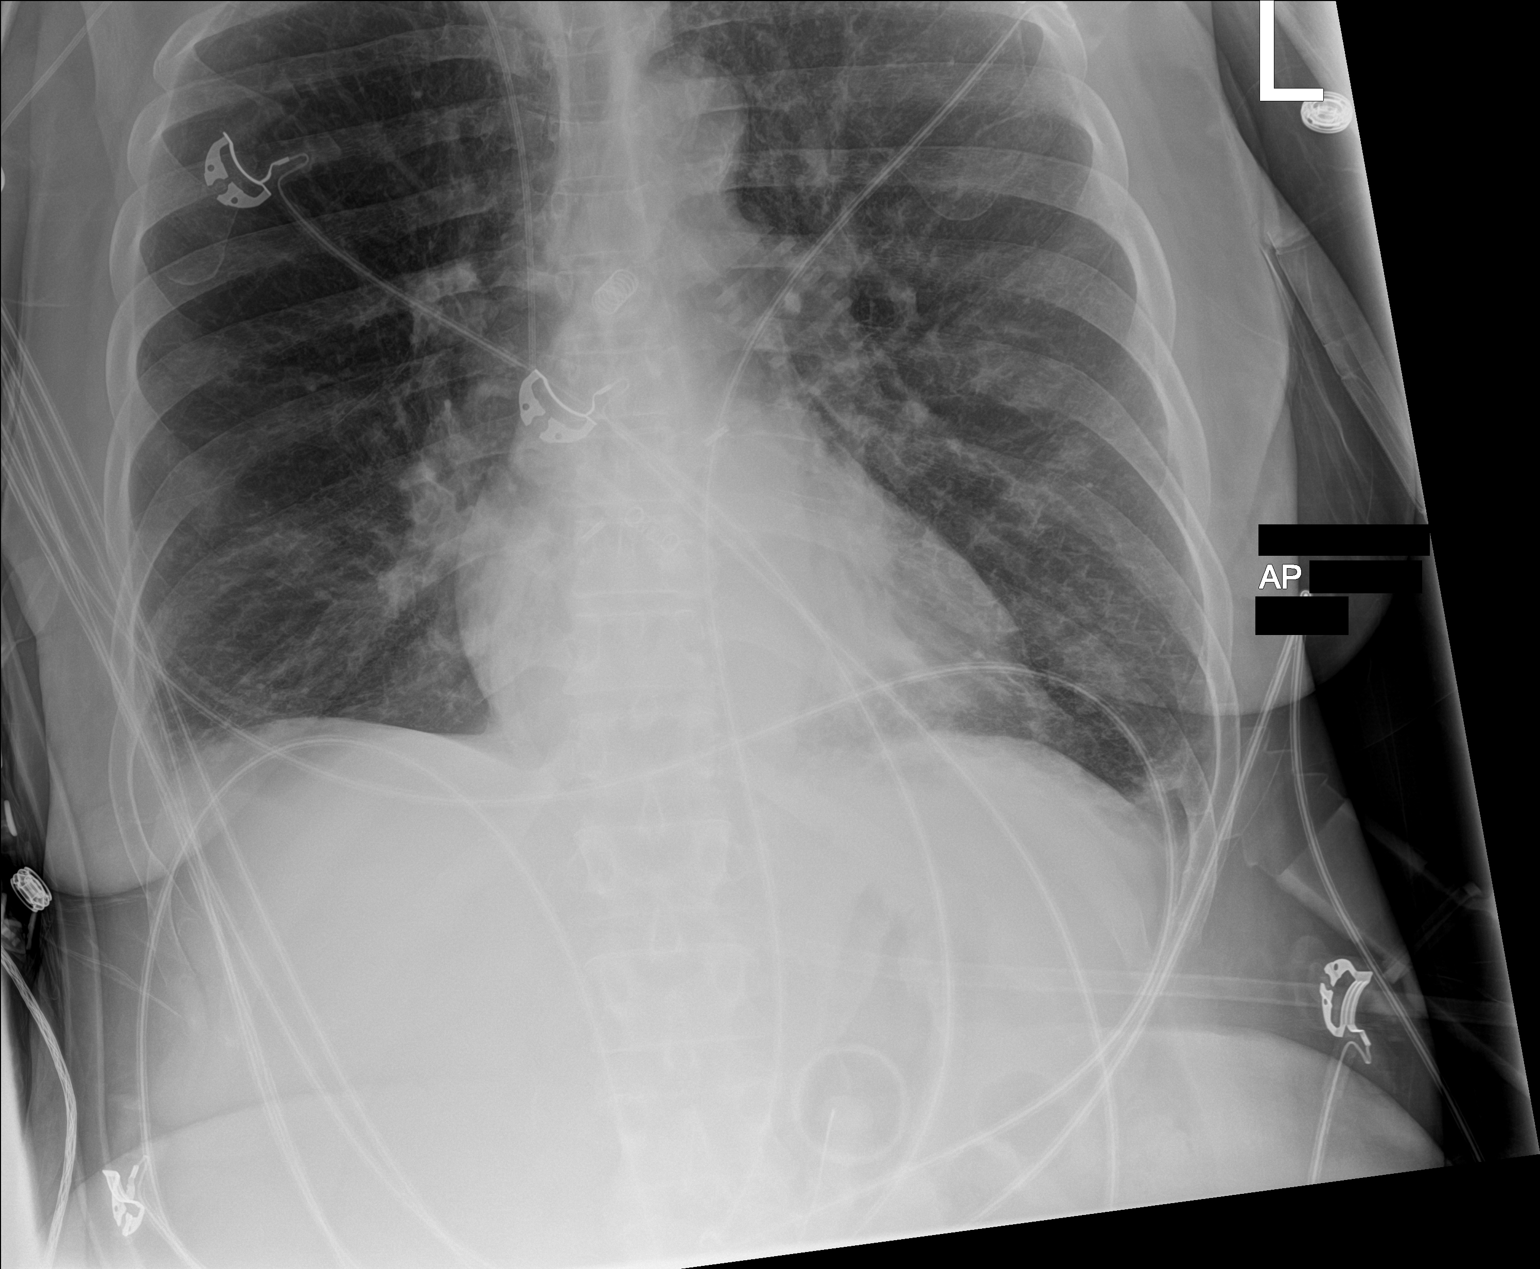

[2 of 2 positions shown; findings below may reference images not displayed]

FINDINGS: The tracheostomy tube and right PICC line are in good position. Mild
increased interstitial markings on the left and in the right base.
No focal consolidation. Tiny effusions. Stable cardiomediastinal
silhouette.
IMPRESSION: 1. Suggested mild asymmetric pulmonary venous congestion/mild edema.
2. Tiny effusions.
3. Support apparatus as above.

## 2019-03-31 IMAGING — DX DG CHEST 1V PORT
1 series · 1 of 1 positions shown · non-contrast
Comparison: 12/14/2017

CLINICAL DATA: Acute respiratory failure

EXAM:
PORTABLE CHEST 1 VIEW

[chest ap]
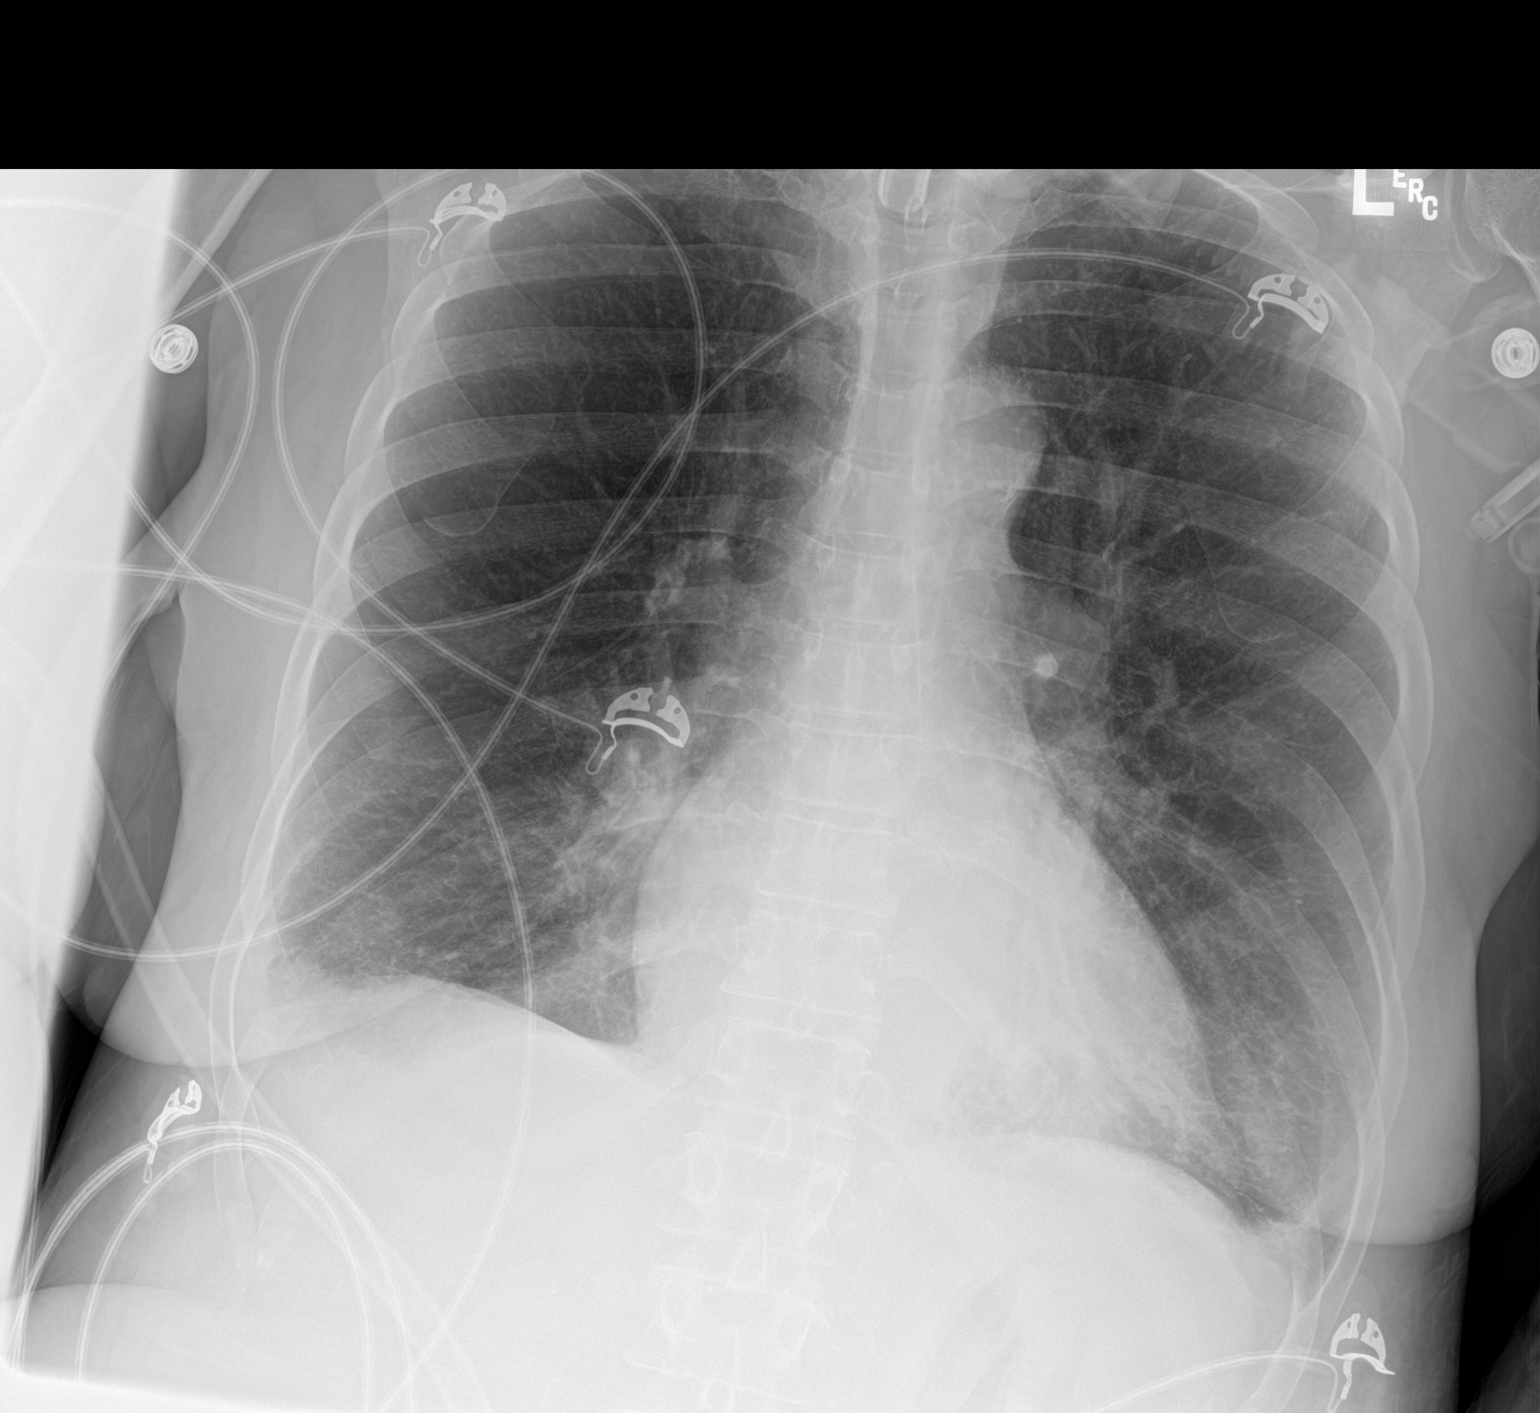

[1 of 1 positions shown; findings below may reference images not displayed]

FINDINGS: Tracheostomy remains in good position. PICC has been removed in the
interval.

Improvement in bibasilar airspace disease. Small bilateral pleural
effusions.
IMPRESSION: Improvement in bibasilar atelectasis/infiltrate. Small bilateral
effusions remain.

## 2019-04-02 ENCOUNTER — Telehealth: Payer: Self-pay | Admitting: Primary Care

## 2019-04-02 NOTE — Telephone Encounter (Signed)
T/c for f/u with palliative care. [place] to please call if they'd like a f/u visit.

## 2019-04-04 IMAGING — DX DG CHEST 1V
1 series · 1 of 1 positions shown · non-contrast
Comparison: 12/16/2017.

CLINICAL DATA: Shortness of breath.

EXAM:
CHEST  1 VIEW

[chest ap]
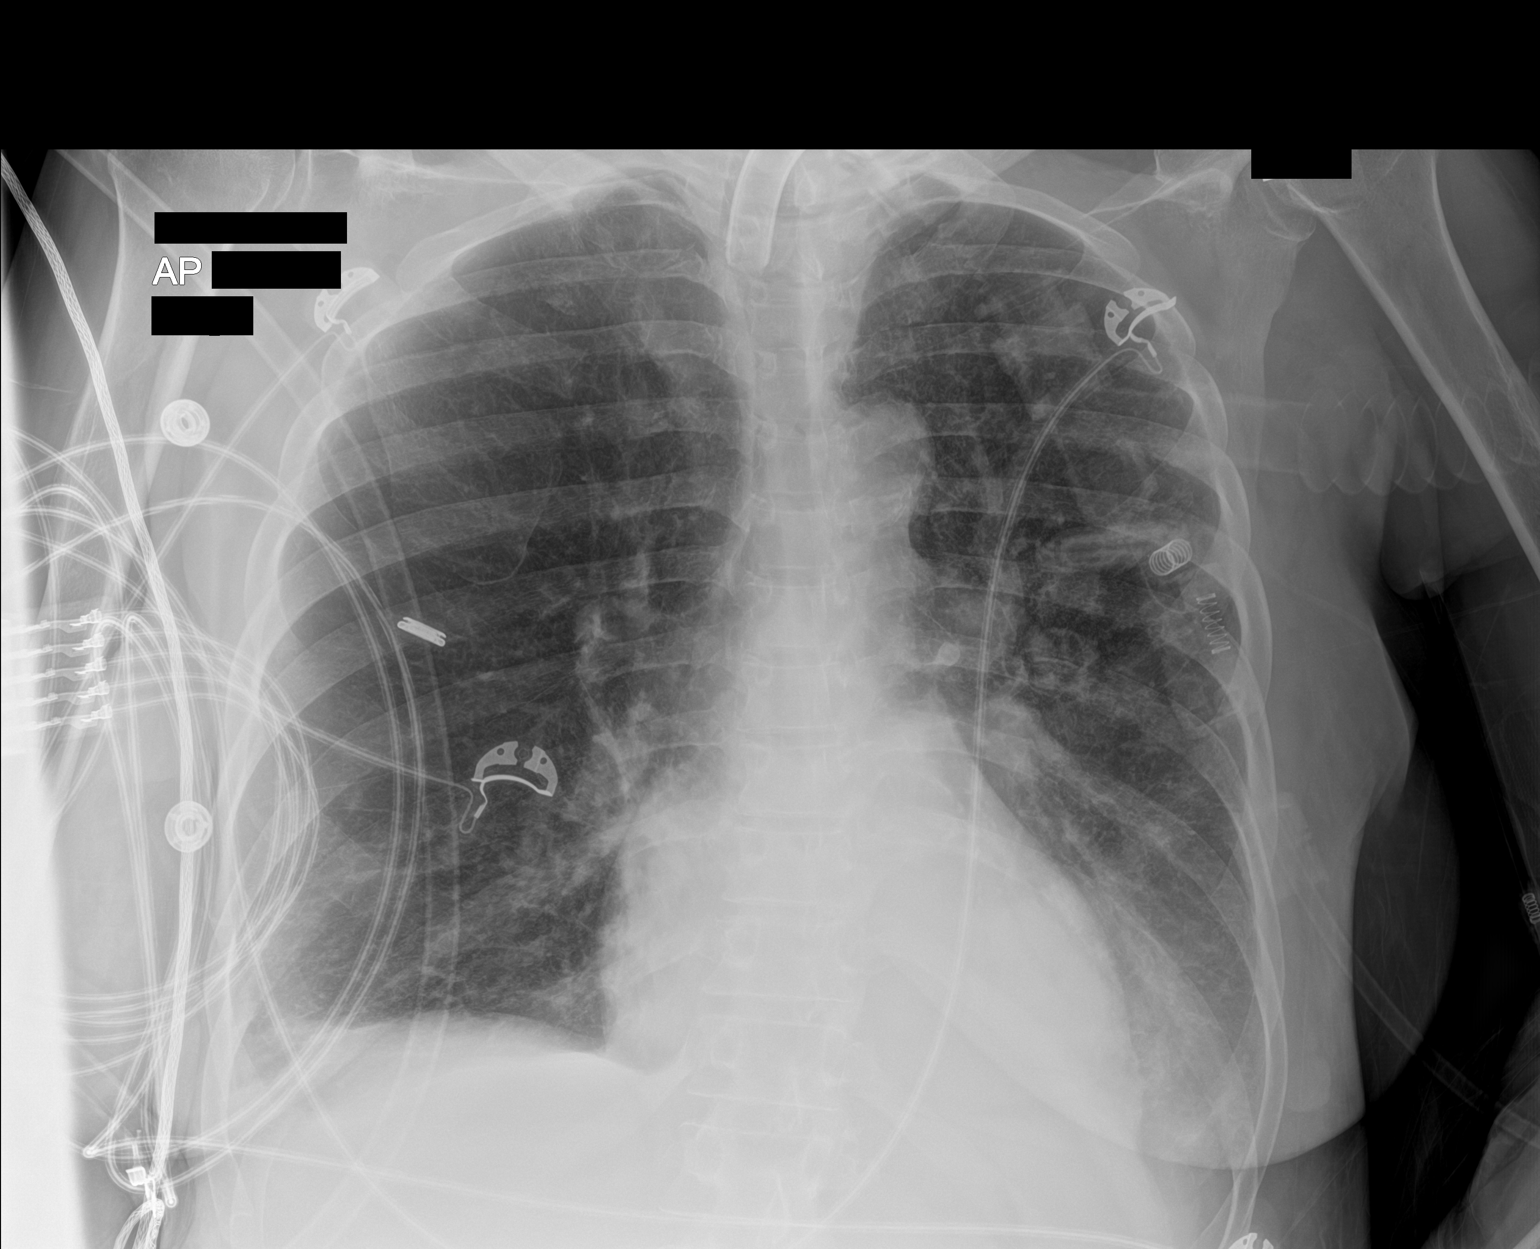

[1 of 1 positions shown; findings below may reference images not displayed]

FINDINGS: Tracheostomy noted with tip over the trachea. Stable positioning.
Mediastinum hilar structures normal. Heart size stable. Left lower
lobe infiltrate and left-sided pleural effusion. Findings have
progressed from prior exam. Small right pleural effusion. No
pneumothorax.
IMPRESSION: 1. Tracheostomy tube noted with its tip over the trachea stable
position.

2. Left lower lobe infiltrate with left-sided pleural effusion.
Findings have progressed from prior exam. Small right pleural
effusion again noted.

## 2019-04-08 IMAGING — DX DG CHEST 1V PORT
2 series · 2 of 2 positions shown · non-contrast
Comparison: Portable chest x-ray December 20, 2017

CLINICAL DATA: Respiratory failure, fever. History of asthma-COPD,
CHF, current smoker. Tracheostomy patient.

EXAM:
PORTABLE CHEST 1 VIEW

[chest ap (1 of 2)]
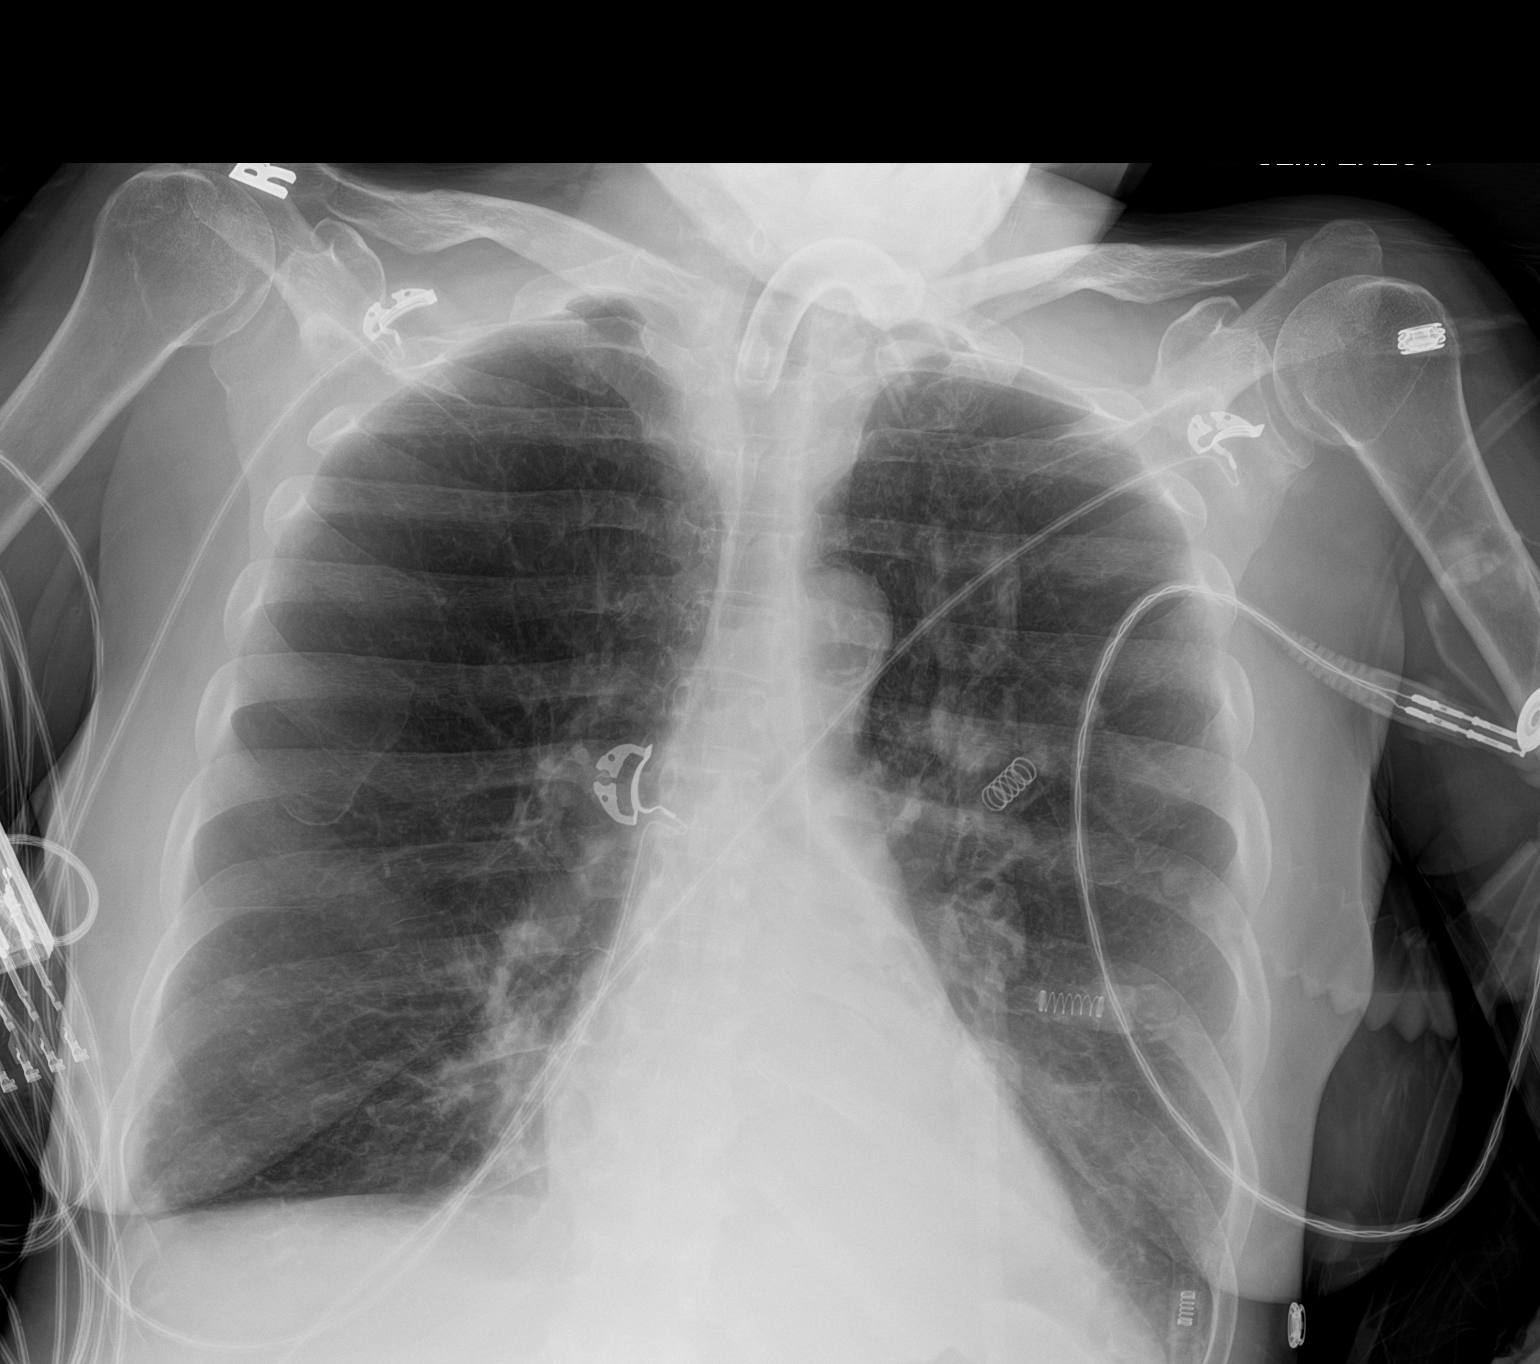

[chest ap (2 of 2)]
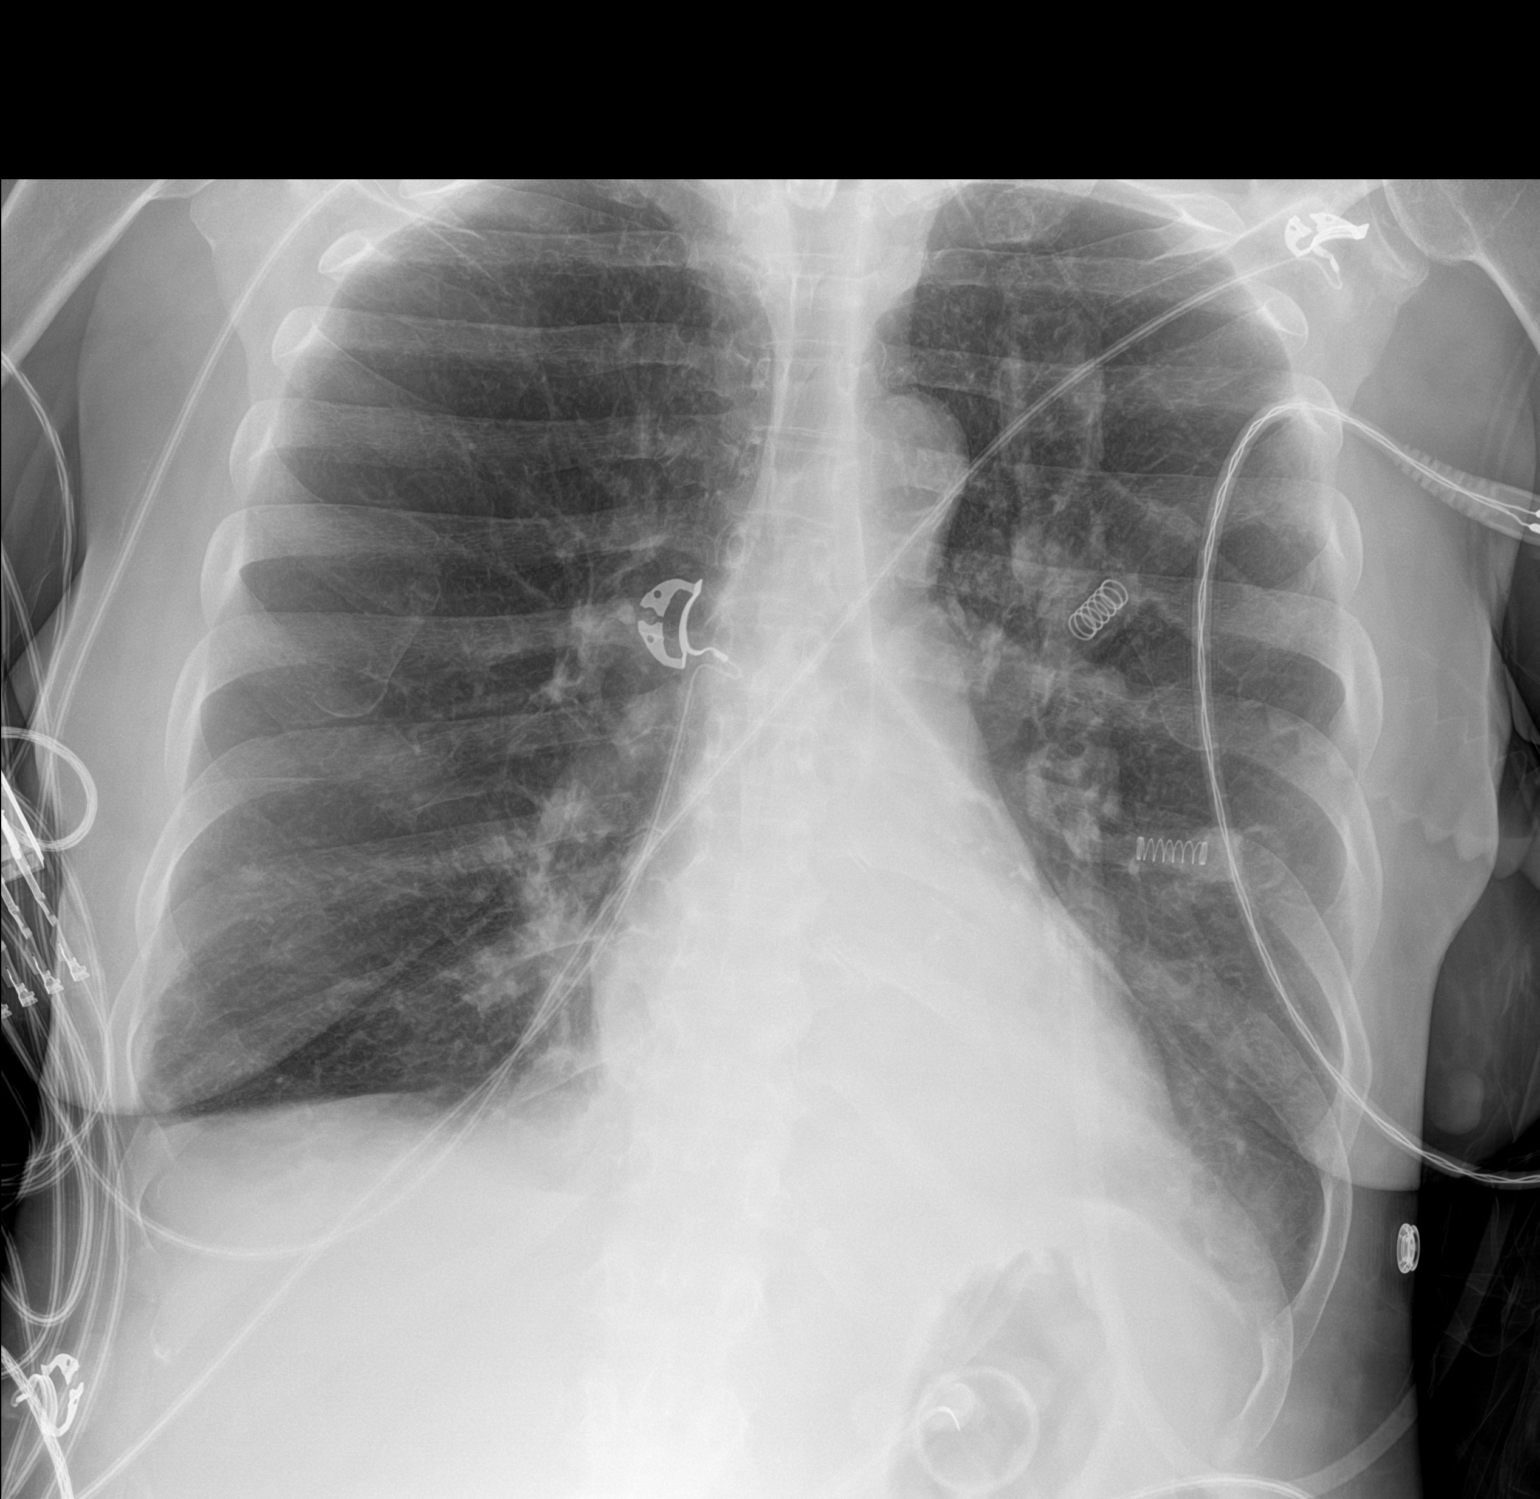

[2 of 2 positions shown; findings below may reference images not displayed]

FINDINGS: The right lung is hyperinflated and clear. On the left the
retrocardiac region remains dense but density more laterally in the
left lower lung has improved. The heart is normal in size. The
central pulmonary vascularity is mildly prominent. The tracheostomy
tube tip projects along the inferior margin of the clavicular heads.
There is calcification in the wall of the aortic arch. The observed
bony thorax exhibits no acute abnormality.
IMPRESSION: Partial clearing of infiltrate at the right lung base piece.
Persistent medial lower lobe atelectasis or pneumonia. Underlying
COPD-reactive airway disease.

Thoracic aortic atherosclerosis.

## 2019-05-06 ENCOUNTER — Telehealth: Payer: Self-pay | Admitting: Primary Care

## 2019-05-06 NOTE — Telephone Encounter (Signed)
Spoke with patient's daughter [name] about scheduling a Palliative f/u visit, she was in agreement with this.  I have scheduled a Telephone f/u visit for 05/15/19 @ 2 PM

## 2019-05-15 ENCOUNTER — Other Ambulatory Visit: Payer: Self-pay

## 2019-05-15 ENCOUNTER — Other Ambulatory Visit: Payer: Medicaid Other | Admitting: Primary Care

## 2019-05-15 DIAGNOSIS — Z515 Encounter for palliative care: Secondary | ICD-10-CM

## 2019-05-15 NOTE — Progress Notes (Signed)
[facility] Palliative Care Consult Note Telephone: [phone]  Fax: [phone]  TELEHEALTH VISIT STATEMENT Due to the COVID-19 crisis, this visit was done via telemedicine from my office. It was initiated and consented to by this patient and/or family.  PATIENT NAME: Michelle [name] [address] [place] [address] [phone] (home)  DOB: 1961/01/09 MRN: [id number]  PRIMARY CARE PROVIDER:   Frazier Richards, [name], [place] [address] [phone]  REFERRING PROVIDER:  Frazier [name], [place],  [place] [address] [phone]  RESPONSIBLE PARTY:   Extended Emergency Contact Information Primary Emergency Contact: Michelle [name], Michelle [name] Phone: [phone] Mobile Phone: [phone] Relation: Daughter Secondary Emergency Contact: Michelle [name] Address: [address] [place],  [address] Michelle [name] of [place] Phone: [phone] Mobile Phone: [phone] Relation: Daughter   ASSESSMENT AND RECOMMENDATIONS:   1. Advance Care Planning/Goals of Care: Goals include to maximize quality of life and symptom management. Telemed with daughter Michelle [name]. She states pt recently went to hospital and stayed 5 days for COPD exacerbation and pneumonia. Patient was on 3 antibiotics in hospital but d/c home on oral meds last week. She discussed how her mother was in and out of the hospital so many times in a year. We discussed possible additional services. Patient may be eligible for medicare and daughter Michelle [name] will contact [employer] to apply. We also discussed getting  Home health for strengthening and for [name] to be out of bed more to improve pulmonary function and as an aspiration precaution when eating. We will also discuss MOST form which she was going to discuss with siblings. DNR is on file. We will continue to discuss and complete on next home visit.   2. Symptom Management:   COPD:  Recent  exacerbation and hospital stay. Continues with NIV at home. Continues with home nursing. Daughter states trach has been d/c'ed and she's eating now po.  Debility: Needs to be oob more. She often declines to get up. She has PT now with home health. I encouraged Michelle [name] to set a get out of bed time daily. She also has a hoyer lift she states she has not used, and does not remember training. I asked her to ask LPN who [name] daily to train her to use hoyer to get [name] in a chair each day.  3. Family /Caregiver/Community Supports: Lives in home with daughters as caregivers. Has medicaid program LPN.  4. Cognitive / Functional decline: Alert, oriented x 3.Daughter states her return to baseline is getting more difficult with each illness exacerbation.  5. Follow up Palliative Care Visit: Palliative care will continue to follow for goals of care clarification and symptom management. Return 4 weeks or prn.  I spent 40 minutes providing this consultation,  from 1330 to 1410. More than 50% of the time in this consultation was spent coordinating communication.   HISTORY OF PRESENT ILLNESS:  Michelle [name] is a 58 y.o. year old female with multiple medical problems including COPD, CHF, CAD, asthma, HTN, tobacco abuse, tracheostomy, g tube placement. Palliative Care was asked to follow this patient by consultation request of Michelle [name] to help address advance care planning and goals of care. This is a follow up visit.  CODE STATUS: DNR  PPS: 30% HOSPICE ELIGIBILITY/DIAGNOSIS: TBD  PAST MEDICAL HISTORY:  Past Medical History:  Diagnosis Date  . Allergy   .  Anxiety   . Asthma   . CHF (congestive heart failure) ([place])   . Cocaine abuse ([place])   . COPD (chronic obstructive pulmonary disease) ([place])   . Coronary artery disease   . Emphysema/COPD ([place])   . Hepatitis C   . High cholesterol   . Hypertension   . Pneumothorax   . Tobacco abuse     SOCIAL HX:  Social History   Tobacco Use  .  Smoking status: Former Smoker    Packs/day: 0.10    Years: 41.00    Pack years: 4.10    Types: Cigarettes    Quit date: 06/09/2017    Years since quitting: 1.9  . Smokeless tobacco: Never Used  Substance Use Topics  . Alcohol use: No    ALLERGIES:  Allergies  Allergen Reactions  . Amoxicillin     Patient has tolerated cephalosporins in the past     PERTINENT MEDICATIONS:  Outpatient Encounter Medications as of 05/15/2019  Medication Sig  . AMBULATORY NON FORMULARY MEDICATION Medication Name: Pulse oximeter DME:AHC DX J44.9....J96.21  . aspirin 81 MG chewable tablet Chew 81 mg by mouth at bedtime.   . budesonide (PULMICORT) 0.25 MG/2ML nebulizer solution Take 2 mLs (0.25 mg total) by nebulization 2 (two) times daily.  . cetirizine (ZYRTEC) 10 MG tablet Place 10 mg into feeding tube daily.   . ciprofloxacin (CIPRO) 750 MG tablet Place 1 tablet (750 mg total) into feeding tube 2 (two) times daily.  . clonazePAM (KLONOPIN) 1 MG tablet Take 2 mg by mouth 2 (two) times daily.  . famotidine (PEPCID) 20 MG tablet Place 1 tablet (20 mg total) into feeding tube daily.  . fentaNYL (DURAGESIC - DOSED MCG/HR) 25 MCG/HR patch Place 1 patch (25 mcg total) onto the skin every 3 (three) days.  . fluticasone (FLONASE) 50 MCG/ACT nasal spray Place 1 spray into both nostrils daily.   . formoterol (PERFOROMIST) 20 MCG/2ML nebulizer solution Take 2 mLs (20 mcg total) by nebulization 2 (two) times daily. (Patient not taking: Reported on 03/08/2019)  . ipratropium-albuterol (DUONEB) 0.5-2.5 (3) MG/3ML SOLN Take 3 mLs by nebulization every 6 (six) hours. (Patient taking differently: Take 3 mLs by nebulization every 6 (six) hours as needed (for shortness of breath). )  . mirtazapine (REMERON) 30 MG tablet Take 30 mg by mouth at bedtime.  Marland Kitchen PARoxetine (PAXIL) 20 MG tablet Place 1 tablet (20 mg total) into feeding tube at bedtime.  . polyethylene glycol (MIRALAX / GLYCOLAX) 17 g packet Place 17 g into feeding  tube daily.  . QUEtiapine (SEROQUEL) 25 MG tablet Place 1 tablet (25 mg total) into feeding tube at bedtime.  . senna (SENOKOT) 8.6 MG TABS tablet Place 1-2 tablets into feeding tube at bedtime.   . simvastatin (ZOCOR) 20 MG tablet Take 40 mg by mouth every evening.    No facility-administered encounter medications on file as of 05/15/2019.    PHYSICAL EXAM / ROS:   Current and past weights: Reported 164 in July, 2020. Currently recorded in [place] in Dec 262 lbs, Recorded in [place] in October 188 lbs.These need to be verified. General: NAD, frail Cardiovascular: no chest pain reported, no edema Pulmonary: no cough, no increased SOB, d/c of trach. Has trilogy and nebulizers Abdomen: appetite fair, endorses occ. constipation, continent of bowel, eating po now. GU: denies dysuria, continent of urine MSK:  no joint deformities, non- ambulatory,spending  more time in bed  Skin: no rashes or wounds reported Neurological: Weakness, A and O  x 3  Jason Coop, NP [facility]

## 2019-06-09 ENCOUNTER — Other Ambulatory Visit: Payer: Self-pay

## 2019-06-09 ENCOUNTER — Other Ambulatory Visit: Payer: Medicaid Other | Admitting: Primary Care

## 2019-06-09 DIAGNOSIS — Z515 Encounter for palliative care: Secondary | ICD-10-CM

## 2019-06-09 NOTE — Progress Notes (Signed)
[facility] Consult Note Telephone: [phone]  Fax: [phone]  PATIENT NAME: Michelle [name] [address] [place] [address] [phone] (home)  DOB: 25-Mar-1961 MRN: [id number]  PRIMARY CARE PROVIDER:   Frazier Richards, [place], [place] [address] [phone]  REFERRING PROVIDER:  Frazier Richards, [place],  [place] [address] [phone]  RESPONSIBLE PARTY:   Extended Emergency Contact Information Primary Emergency Contact: [place], [place] Phone: [phone] Mobile Phone: [phone] Relation: Daughter Secondary Emergency Contact: Elizabeth Sauer Address: [address] [place], [place] [address] Johnnette Litter of [place] Phone: [phone] Mobile Phone: [phone] Relation: Daughter   ASSESSMENT AND RECOMMENDATIONS:   1. Advance Care Planning/Goals of Care: Goals include to maximize quality of life and symptom management. Advance directives: She has a DNR golden rod on file but we had been planning to discuss the MOST on our next visit. She and her daughter [name] participated in creating the MOST  form and she asked her daughter to sign it. She has elected DNR full scope of care, antibiotic use, IV use and she already has a feeding tube. We discussed her creating a medical power of attorney especially in light of the fact that she has several children. I encourage them to do this to clarify her goals of care. I  gave [name] a five wishes form to complete the health-care power of attorney designation so her family can carry out her wishes accurately.  Patient endorses that this was a good idea.  2. Symptom Management:   Infection control: I met with Mrs. Jarriel in her home with her daughter [name]. She has been doing fairly well now and not in the hospital for about six weeks. We discussed her last two COPD exacerbations.  We discussed keeping people with  respiratory illnesses out of the house and prevention such as the flu vaccine and the Covid vaccine. I suggested she call the health department for information on getting the Covid vaccine.   Pain: She currently states she is taking  fentanyl 50 g transdermal and breakthrough po oxycodone 5 mg one or two a day. Generally she says this controls her pain well. This is not on her current med list and needs verification.  Dyspnea: Her lung sounds were diminished with expiratory wheezes. She does her nebulizers on a schedule. She is on a NIV constantly, with oxygen. She states she does not experience feelings of respiratory distress at her baseline.   Nutrition: intake is adequate and her weight is maintaining. Albumin in 05/2019=  3.9.  3. Family /Caregiver/Community Supports: [place]. Is known to [facility] for hospitalizations. Daughter will investigate Medicare disability status.  4. Cognitive / Functional decline: Alert, oriented x 2-3. Unable to speak audibly but mouths words. States she generally stays in bed although she's just gotten a motorized chair and anticipates using it to go out. Daughter will look for a towing platform.  5. Follow up Palliative Care Visit: Palliative care will continue to follow for goals of care clarification and symptom management. Return 6-8 weeks or prn.  I spent 60 minutes providing this consultation,  from 1500 to 1600. More than 50% of the time in this consultation was spent coordinating communication.   HISTORY OF PRESENT ILLNESS:  Michelle [name] is a 59 y.o. year old female with multiple  medical problems including COPD, NIV dependency, chronic pain, hepatitis C, CHF, CAD. Palliative Care was asked to follow this patient by consultation request of Adamo, Hattie Perch, MD to help address advance care planning and goals of care. This is a follow up visit.  CODE STATUS: DNR, MOST with DNR, full scope  otherwise.  PPS: 30% HOSPICE ELIGIBILITY/DIAGNOSIS: TBD  PAST MEDICAL HISTORY:  Past Medical History:  Diagnosis Date  . Allergy   . Anxiety   . Asthma   . CHF (congestive heart failure) ([place])   . Cocaine abuse ([place])   . COPD (chronic obstructive pulmonary disease) ([place])   . Coronary artery disease   . Emphysema/COPD ([place])   . Hepatitis C   . High cholesterol   . Hypertension   . Pneumothorax   . Tobacco abuse     SOCIAL HX:  Social History   Tobacco Use  . Smoking status: Former Smoker    Packs/day: 0.10    Years: 41.00    Pack years: 4.10    Types: Cigarettes    Quit date: 06/09/2017    Years since quitting: 2.0  . Smokeless tobacco: Never Used  Substance Use Topics  . Alcohol use: No    ALLERGIES:  Allergies  Allergen Reactions  . Amoxicillin     Patient has tolerated cephalosporins in the past     PERTINENT MEDICATIONS:  Outpatient Encounter Medications as of 06/09/2019  Medication Sig  . AMBULATORY NON FORMULARY MEDICATION Medication Name: Pulse oximeter DME:AHC DX J44.9....J96.21  . aspirin 81 MG chewable tablet Chew 81 mg by mouth at bedtime.   . budesonide (PULMICORT) 0.25 MG/2ML nebulizer solution Take 2 mLs (0.25 mg total) by nebulization 2 (two) times daily.  . cetirizine (ZYRTEC) 10 MG tablet Place 10 mg into feeding tube daily.   . ciprofloxacin (CIPRO) 750 MG tablet Place 1 tablet (750 mg total) into feeding tube 2 (two) times daily.  . clonazePAM (KLONOPIN) 1 MG tablet Take 2 mg by mouth 2 (two) times daily.  . famotidine (PEPCID) 20 MG tablet Place 1 tablet (20 mg total) into feeding tube daily.  . fentaNYL (DURAGESIC - DOSED MCG/HR) 25 MCG/HR patch Place 1 patch (25 mcg total) onto the skin every 3 (three) days.  . fluticasone (FLONASE) 50 MCG/ACT nasal spray Place 1 spray into both nostrils daily.   . formoterol (PERFOROMIST) 20 MCG/2ML nebulizer solution Take 2 mLs (20 mcg total) by nebulization 2 (two) times daily. (Patient not taking:  Reported on 03/08/2019)  . ipratropium-albuterol (DUONEB) 0.5-2.5 (3) MG/3ML SOLN Take 3 mLs by nebulization every 6 (six) hours. (Patient taking differently: Take 3 mLs by nebulization every 6 (six) hours as needed (for shortness of breath). )  . mirtazapine (REMERON) 30 MG tablet Take 30 mg by mouth at bedtime.  Marland Kitchen PARoxetine (PAXIL) 20 MG tablet Place 1 tablet (20 mg total) into feeding tube at bedtime.  . polyethylene glycol (MIRALAX / GLYCOLAX) 17 g packet Place 17 g into feeding tube daily.  . QUEtiapine (SEROQUEL) 25 MG tablet Place 1 tablet (25 mg total) into feeding tube at bedtime.  . senna (SENOKOT) 8.6 MG TABS tablet Place 1-2 tablets into feeding tube at bedtime.   . simvastatin (ZOCOR) 20 MG tablet Take 40 mg by mouth every evening.    No facility-administered encounter medications on file as of 06/09/2019.    PHYSICAL EXAM / ROS:   Current and past weights: 262 lbs.recorded in 12/20 General: NAD, frail appearing, WNWD Cardiovascular: S1S2,  no chest pain reported, no edema in LE Pulmonary: no cough, no increased SOB. NIV in place and running at 20 bpm with settings of 36.6/8.9 and leak rate of 30. Trach in place Abdomen: appetite fair, denies constipation, continent of bowel , uses bsc, bedpan and sometimes briefs GU: denies dysuria, incontinent of urine MSK:  no joint deformities, non ambulatory, mostly bedbound, transfers to chair Skin: no rashes or wounds reported Neurological: Weakness, difficulty in speaking due to trach, sleep is good. Pain is controlled with current regimen.  Jason Coop, NP, [facility]  COVID-19 PATIENT SCREENING TOOL  Person answering questions: ___________[name]________ _____   1.  Is the patient or any family member in the home showing any signs or symptoms regarding respiratory infection?               Person with Symptom- ____________NA_______________  a. Fever                                                                           Yes___ No___          ___________________  b. Shortness of breath                                                    Yes___ No___          ___________________ c. Cough/congestion                                       Yes___  No___         ___________________ d. Body aches/pains                                                         Yes___ No___        ____________________ e. Gastrointestinal symptoms (diarrhea, nausea)           Yes___ No___        ____________________  2. Within the past 14 days, has anyone living in the home had any contact with someone with or under investigation for COVID-19?    Yes___ No__x   Person __________________

## 2019-06-24 ENCOUNTER — Emergency Department: Payer: Medicaid Other

## 2019-06-24 ENCOUNTER — Inpatient Hospital Stay
Admission: EM | Admit: 2019-06-24 | Discharge: 2019-07-05 | DRG: 207 | Disposition: A | Discharge status: Home Health | Payer: Medicaid Other | Attending: Internal Medicine | Admitting: Internal Medicine

## 2019-06-24 ENCOUNTER — Other Ambulatory Visit: Payer: Self-pay

## 2019-06-24 DIAGNOSIS — Z8249 Family history of ischemic heart disease and other diseases of the circulatory system: Secondary | ICD-10-CM

## 2019-06-24 DIAGNOSIS — Z823 Family history of stroke: Secondary | ICD-10-CM

## 2019-06-24 DIAGNOSIS — Z7982 Long term (current) use of aspirin: Secondary | ICD-10-CM | POA: Diagnosis not present

## 2019-06-24 DIAGNOSIS — B1920 Unspecified viral hepatitis C without hepatic coma: Secondary | ICD-10-CM | POA: Diagnosis present

## 2019-06-24 DIAGNOSIS — J439 Emphysema, unspecified: Secondary | ICD-10-CM | POA: Diagnosis present

## 2019-06-24 DIAGNOSIS — Z20822 Contact with and (suspected) exposure to covid-19: Secondary | ICD-10-CM | POA: Diagnosis present

## 2019-06-24 DIAGNOSIS — Z801 Family history of malignant neoplasm of trachea, bronchus and lung: Secondary | ICD-10-CM

## 2019-06-24 DIAGNOSIS — R059 Cough, unspecified: Secondary | ICD-10-CM

## 2019-06-24 DIAGNOSIS — Z825 Family history of asthma and other chronic lower respiratory diseases: Secondary | ICD-10-CM

## 2019-06-24 DIAGNOSIS — R05 Cough: Secondary | ICD-10-CM | POA: Diagnosis present

## 2019-06-24 DIAGNOSIS — J9622 Acute and chronic respiratory failure with hypercapnia: Secondary | ICD-10-CM | POA: Diagnosis present

## 2019-06-24 DIAGNOSIS — Z87891 Personal history of nicotine dependence: Secondary | ICD-10-CM

## 2019-06-24 DIAGNOSIS — B965 Pseudomonas (aeruginosa) (mallei) (pseudomallei) as the cause of diseases classified elsewhere: Secondary | ICD-10-CM | POA: Diagnosis not present

## 2019-06-24 DIAGNOSIS — E1165 Type 2 diabetes mellitus with hyperglycemia: Secondary | ICD-10-CM | POA: Diagnosis present

## 2019-06-24 DIAGNOSIS — I110 Hypertensive heart disease with heart failure: Secondary | ICD-10-CM | POA: Diagnosis present

## 2019-06-24 DIAGNOSIS — I5032 Chronic diastolic (congestive) heart failure: Secondary | ICD-10-CM | POA: Diagnosis present

## 2019-06-24 DIAGNOSIS — G8929 Other chronic pain: Secondary | ICD-10-CM | POA: Diagnosis present

## 2019-06-24 DIAGNOSIS — E119 Type 2 diabetes mellitus without complications: Secondary | ICD-10-CM | POA: Diagnosis not present

## 2019-06-24 DIAGNOSIS — R739 Hyperglycemia, unspecified: Secondary | ICD-10-CM

## 2019-06-24 DIAGNOSIS — Z803 Family history of malignant neoplasm of breast: Secondary | ICD-10-CM | POA: Diagnosis not present

## 2019-06-24 DIAGNOSIS — J441 Chronic obstructive pulmonary disease with (acute) exacerbation: Secondary | ICD-10-CM | POA: Diagnosis present

## 2019-06-24 DIAGNOSIS — I10 Essential (primary) hypertension: Secondary | ICD-10-CM | POA: Diagnosis present

## 2019-06-24 DIAGNOSIS — E785 Hyperlipidemia, unspecified: Secondary | ICD-10-CM | POA: Diagnosis present

## 2019-06-24 DIAGNOSIS — J9621 Acute and chronic respiratory failure with hypoxia: Secondary | ICD-10-CM | POA: Diagnosis present

## 2019-06-24 DIAGNOSIS — Z931 Gastrostomy status: Secondary | ICD-10-CM

## 2019-06-24 DIAGNOSIS — Z79899 Other long term (current) drug therapy: Secondary | ICD-10-CM

## 2019-06-24 DIAGNOSIS — Z66 Do not resuscitate: Secondary | ICD-10-CM | POA: Diagnosis present

## 2019-06-24 DIAGNOSIS — I4510 Unspecified right bundle-branch block: Secondary | ICD-10-CM | POA: Diagnosis present

## 2019-06-24 DIAGNOSIS — Z9911 Dependence on respirator [ventilator] status: Secondary | ICD-10-CM | POA: Diagnosis not present

## 2019-06-24 DIAGNOSIS — J449 Chronic obstructive pulmonary disease, unspecified: Secondary | ICD-10-CM | POA: Diagnosis present

## 2019-06-24 DIAGNOSIS — Z9981 Dependence on supplemental oxygen: Secondary | ICD-10-CM

## 2019-06-24 DIAGNOSIS — Z4659 Encounter for fitting and adjustment of other gastrointestinal appliance and device: Secondary | ICD-10-CM

## 2019-06-24 DIAGNOSIS — E1169 Type 2 diabetes mellitus with other specified complication: Secondary | ICD-10-CM | POA: Diagnosis present

## 2019-06-24 DIAGNOSIS — J9610 Chronic respiratory failure, unspecified whether with hypoxia or hypercapnia: Secondary | ICD-10-CM

## 2019-06-24 DIAGNOSIS — Z880 Allergy status to penicillin: Secondary | ICD-10-CM | POA: Diagnosis not present

## 2019-06-24 DIAGNOSIS — J9600 Acute respiratory failure, unspecified whether with hypoxia or hypercapnia: Secondary | ICD-10-CM

## 2019-06-24 DIAGNOSIS — J151 Pneumonia due to Pseudomonas: Principal | ICD-10-CM | POA: Diagnosis present

## 2019-06-24 DIAGNOSIS — Z01818 Encounter for other preprocedural examination: Secondary | ICD-10-CM

## 2019-06-24 DIAGNOSIS — F418 Other specified anxiety disorders: Secondary | ICD-10-CM | POA: Diagnosis not present

## 2019-06-24 DIAGNOSIS — I2510 Atherosclerotic heart disease of native coronary artery without angina pectoris: Secondary | ICD-10-CM | POA: Diagnosis present

## 2019-06-24 DIAGNOSIS — J988 Other specified respiratory disorders: Secondary | ICD-10-CM | POA: Diagnosis not present

## 2019-06-24 DIAGNOSIS — I959 Hypotension, unspecified: Secondary | ICD-10-CM | POA: Diagnosis not present

## 2019-06-24 DIAGNOSIS — Z930 Tracheostomy status: Secondary | ICD-10-CM

## 2019-06-24 DIAGNOSIS — E7800 Pure hypercholesterolemia, unspecified: Secondary | ICD-10-CM | POA: Diagnosis present

## 2019-06-24 DIAGNOSIS — Z452 Encounter for adjustment and management of vascular access device: Secondary | ICD-10-CM

## 2019-06-24 DIAGNOSIS — E875 Hyperkalemia: Secondary | ICD-10-CM | POA: Diagnosis not present

## 2019-06-24 DIAGNOSIS — R1310 Dysphagia, unspecified: Secondary | ICD-10-CM | POA: Diagnosis present

## 2019-06-24 DIAGNOSIS — E876 Hypokalemia: Secondary | ICD-10-CM | POA: Diagnosis present

## 2019-06-24 LAB — URINALYSIS, COMPLETE (UACMP) WITH MICROSCOPIC
Bacteria, UA: NONE SEEN
Bilirubin Urine: NEGATIVE
Glucose, UA: >=500 mg/dL — AB
Ketones, ur: 5 mg/dL — AB
Nitrite: NEGATIVE
Protein, ur: 100 mg/dL — AB
RBC / HPF: >50 RBC/hpf — ABNORMAL HIGH (ref 0–5)
Specific Gravity, Urine: 1.033 — ABNORMAL HIGH (ref 1.005–1.030)
pH: 6 (ref 5.0–8.0)

## 2019-06-24 LAB — CBC WITH DIFFERENTIAL/PLATELET
Abs Immature Granulocytes: 0.02 10*3/uL (ref 0.00–0.07)
Basophils Absolute: 0 10*3/uL (ref 0.0–0.1)
Basophils Relative: 1 %
Eosinophils Absolute: 0.2 10*3/uL (ref 0.0–0.5)
Eosinophils Relative: 5 %
HCT: 42.1 % (ref 36.0–46.0)
Hemoglobin: 13.1 g/dL (ref 12.0–15.0)
Immature Granulocytes: 0 %
Lymphocytes Relative: 25 %
Lymphs Abs: 1.2 10*3/uL (ref 0.7–4.0)
MCH: 25.2 pg — ABNORMAL LOW (ref 26.0–34.0)
MCHC: 31.1 g/dL (ref 30.0–36.0)
MCV: 81 fL (ref 80.0–100.0)
Monocytes Absolute: 0.4 10*3/uL (ref 0.1–1.0)
Monocytes Relative: 8 %
Neutro Abs: 2.9 10*3/uL (ref 1.7–7.7)
Neutrophils Relative %: 61 %
Platelets: 213 10*3/uL (ref 150–400)
RBC: 5.2 MIL/uL — ABNORMAL HIGH (ref 3.87–5.11)
RDW: 15.3 % (ref 11.5–15.5)
WBC: 4.8 10*3/uL (ref 4.0–10.5)
nRBC: 0 % (ref 0.0–0.2)

## 2019-06-24 LAB — BASIC METABOLIC PANEL
Anion gap: 13 (ref 5–15)
BUN: 7 mg/dL (ref 6–20)
CO2: 23 mmol/L (ref 22–32)
Calcium: 8.8 mg/dL — ABNORMAL LOW (ref 8.9–10.3)
Chloride: 97 mmol/L — ABNORMAL LOW (ref 98–111)
Creatinine, Ser: 1.01 mg/dL — ABNORMAL HIGH (ref 0.44–1.00)
GFR calc Af Amer: >60 mL/min (ref >60)
GFR calc non Af Amer: >60 mL/min (ref >60)
Glucose, Bld: 563 mg/dL (ref 70–99)
Potassium: 3 mmol/L — ABNORMAL LOW (ref 3.5–5.1)
Sodium: 133 mmol/L — ABNORMAL LOW (ref 135–145)

## 2019-06-24 LAB — BLOOD GAS, VENOUS
Acid-Base Excess: 1.5 mmol/L (ref 0.0–2.0)
Bicarbonate: 26.6 mmol/L (ref 20.0–28.0)
O2 Saturation: 81.7 %
Patient temperature: 37
pCO2, Ven: 43 mmHg — ABNORMAL LOW (ref 44.0–60.0)
pH, Ven: 7.4 (ref 7.250–7.430)
pO2, Ven: 46 mmHg — ABNORMAL HIGH (ref 32.0–45.0)

## 2019-06-24 LAB — POC SARS CORONAVIRUS 2 AG: SARS Coronavirus 2 Ag: NEGATIVE

## 2019-06-24 LAB — BRAIN NATRIURETIC PEPTIDE: B Natriuretic Peptide: 42 pg/mL (ref 0.0–100.0)

## 2019-06-24 LAB — TROPONIN I (HIGH SENSITIVITY): Troponin I (High Sensitivity): 20 ng/L — ABNORMAL HIGH (ref <18)

## 2019-06-24 MED ORDER — SODIUM CHLORIDE 0.9% FLUSH
3.0000 mL | Freq: Two times a day (BID) | INTRAVENOUS | Status: DC
  Administered 2019-06-25 – 2019-07-05 (×20): 3 mL via INTRAVENOUS

## 2019-06-24 MED ORDER — FENTANYL 25 MCG/HR TD PT72
1.0000 | MEDICATED_PATCH | TRANSDERMAL | Status: DC
  Administered 2019-06-25 – 2019-06-30 (×3): 1 via TRANSDERMAL
  Filled 2019-06-24 (×2): qty 1

## 2019-06-24 MED ORDER — SODIUM CHLORIDE 0.9% FLUSH: 3.0000 mL | INTRAVENOUS | Status: DC | PRN

## 2019-06-24 MED ORDER — FLUTICASONE PROPIONATE 50 MCG/ACT NA SUSP
1.0000 | Freq: Every day | NASAL | Status: DC
  Administered 2019-06-25 – 2019-07-04 (×8): 1 via NASAL
  Filled 2019-06-24: qty 16

## 2019-06-24 MED ORDER — SODIUM CHLORIDE 0.9 % IV BOLUS
500.0000 mL | Freq: Once | INTRAVENOUS | Status: AC
  Administered 2019-06-24: 500 mL via INTRAVENOUS

## 2019-06-24 MED ORDER — SODIUM CHLORIDE 0.9 % IR SOLN
50.0000 mL | Freq: Four times a day (QID) | Status: DC
  Administered 2019-06-25 – 2019-07-05 (×29): 50 mL

## 2019-06-24 MED ORDER — LORATADINE 10 MG PO TABS
10.0000 mg | ORAL_TABLET | Freq: Every day | ORAL | Status: DC
  Administered 2019-06-25 – 2019-07-05 (×11): 10 mg via ORAL
  Filled 2019-06-24 (×11): qty 1

## 2019-06-24 MED ORDER — ASPIRIN 81 MG PO CHEW
81.0000 mg | CHEWABLE_TABLET | Freq: Every day | ORAL | Status: DC
  Administered 2019-06-25 – 2019-07-05 (×11): 81 mg via ORAL
  Filled 2019-06-24 (×11): qty 1

## 2019-06-24 MED ORDER — OXYCODONE-ACETAMINOPHEN 5-325 MG PO TABS
1.0000 | ORAL_TABLET | Freq: Three times a day (TID) | ORAL | Status: DC | PRN
  Administered 2019-06-25 – 2019-07-05 (×13): 1 via ORAL
  Filled 2019-06-24 (×13): qty 1

## 2019-06-24 MED ORDER — SENNA 8.6 MG PO TABS
1.0000 | ORAL_TABLET | Freq: Every day | ORAL | Status: DC
  Filled 2019-06-24 (×2): qty 1

## 2019-06-24 MED ORDER — LEVOFLOXACIN IN D5W 750 MG/150ML IV SOLN
750.0000 mg | Freq: Once | INTRAVENOUS | Status: AC
  Administered 2019-06-24: 22:00:00 750 mg via INTRAVENOUS
  Filled 2019-06-24: qty 150

## 2019-06-24 MED ORDER — INSULIN ASPART 100 UNIT/ML ~~LOC~~ SOLN
6.0000 [IU] | Freq: Once | SUBCUTANEOUS | Status: AC
  Administered 2019-06-25: 01:00:00 6 [IU] via SUBCUTANEOUS
  Filled 2019-06-24: qty 1

## 2019-06-24 MED ORDER — POTASSIUM CHLORIDE IN NACL 20-0.9 MEQ/L-% IV SOLN
INTRAVENOUS | Status: DC
  Filled 2019-06-24 (×8): qty 1000

## 2019-06-24 MED ORDER — SODIUM CHLORIDE 0.9 % IV SOLN: 250.0000 mL | INTRAVENOUS | Status: DC | PRN

## 2019-06-24 MED ORDER — LEVOFLOXACIN IN D5W 750 MG/150ML IV SOLN
750.0000 mg | INTRAVENOUS | Status: DC
  Administered 2019-06-25 – 2019-06-26 (×2): 750 mg via INTRAVENOUS
  Filled 2019-06-24 (×4): qty 150

## 2019-06-24 MED ORDER — FAMOTIDINE 20 MG PO TABS
20.0000 mg | ORAL_TABLET | Freq: Every day | ORAL | Status: DC
  Administered 2019-06-25 – 2019-07-05 (×11): 20 mg
  Filled 2019-06-24 (×11): qty 1

## 2019-06-24 MED ORDER — CLONAZEPAM 1 MG PO TABS
2.0000 mg | ORAL_TABLET | Freq: Two times a day (BID) | ORAL | Status: DC
  Administered 2019-06-25 – 2019-06-30 (×12): 2 mg via ORAL
  Filled 2019-06-24 (×10): qty 2
  Filled 2019-06-24: qty 4
  Filled 2019-06-24: qty 2

## 2019-06-24 MED ORDER — POLYETHYLENE GLYCOL 3350 17 G PO PACK: 17.0000 g | PACK | Freq: Every day | ORAL | Status: DC

## 2019-06-24 MED ORDER — ENOXAPARIN SODIUM 40 MG/0.4ML ~~LOC~~ SOLN
40.0000 mg | SUBCUTANEOUS | Status: DC
  Administered 2019-06-25 – 2019-07-04 (×11): 40 mg via SUBCUTANEOUS
  Filled 2019-06-24 (×11): qty 0.4

## 2019-06-24 MED ORDER — POTASSIUM CHLORIDE 10 MEQ/100ML IV SOLN
10.0000 meq | INTRAVENOUS | Status: AC
  Administered 2019-06-25 (×3): 10 meq via INTRAVENOUS
  Filled 2019-06-24 (×3): qty 100

## 2019-06-24 MED ORDER — BUDESONIDE 0.25 MG/2ML IN SUSP
0.2500 mg | Freq: Two times a day (BID) | RESPIRATORY_TRACT | Status: DC
  Administered 2019-06-25 – 2019-06-28 (×8): 0.25 mg via RESPIRATORY_TRACT
  Filled 2019-06-24 (×8): qty 2

## 2019-06-24 MED ORDER — INSULIN ASPART 100 UNIT/ML ~~LOC~~ SOLN
0.0000 [IU] | SUBCUTANEOUS | Status: DC
  Administered 2019-06-25 (×2): 11 [IU] via SUBCUTANEOUS
  Administered 2019-06-25: 08:00:00 5 [IU] via SUBCUTANEOUS
  Administered 2019-06-25: 14:00:00 8 [IU] via SUBCUTANEOUS
  Administered 2019-06-25 – 2019-06-26 (×2): 15 [IU] via SUBCUTANEOUS
  Administered 2019-06-26: 8 [IU] via SUBCUTANEOUS
  Administered 2019-06-26 (×4): 11 [IU] via SUBCUTANEOUS
  Administered 2019-06-27: 8 [IU] via SUBCUTANEOUS
  Administered 2019-06-27: 12:00:00 5 [IU] via SUBCUTANEOUS
  Administered 2019-06-27: 09:00:00 8 [IU] via SUBCUTANEOUS
  Administered 2019-06-27: 11 [IU] via SUBCUTANEOUS
  Filled 2019-06-24 (×15): qty 1

## 2019-06-24 MED ORDER — MIRTAZAPINE 15 MG PO TABS
30.0000 mg | ORAL_TABLET | Freq: Every day | ORAL | Status: DC
  Administered 2019-06-25 – 2019-06-26 (×3): 30 mg via ORAL
  Filled 2019-06-24 (×3): qty 2

## 2019-06-24 MED ORDER — SIMVASTATIN 20 MG PO TABS
40.0000 mg | ORAL_TABLET | Freq: Every evening | ORAL | Status: DC
  Administered 2019-06-25 – 2019-07-04 (×10): 40 mg via ORAL
  Filled 2019-06-24 (×10): qty 2

## 2019-06-24 MED ORDER — OXYCODONE-ACETAMINOPHEN 5-325 MG PO TABS
1.0000 | ORAL_TABLET | Freq: Once | ORAL | Status: AC
  Administered 2019-06-24: 1 via ORAL
  Filled 2019-06-24: qty 1

## 2019-06-24 MED ORDER — QUETIAPINE FUMARATE 25 MG PO TABS
25.0000 mg | ORAL_TABLET | Freq: Every day | ORAL | Status: DC
  Administered 2019-06-25 – 2019-06-27 (×4): 25 mg
  Filled 2019-06-24 (×5): qty 1

## 2019-06-24 MED ORDER — IPRATROPIUM-ALBUTEROL 0.5-2.5 (3) MG/3ML IN SOLN
3.0000 mL | Freq: Four times a day (QID) | RESPIRATORY_TRACT | Status: DC | PRN
  Administered 2019-06-24 – 2019-06-29 (×10): 3 mL via RESPIRATORY_TRACT
  Filled 2019-06-24 (×10): qty 3

## 2019-06-24 MED ORDER — PAROXETINE HCL 20 MG PO TABS
20.0000 mg | ORAL_TABLET | Freq: Every day | ORAL | Status: DC
  Administered 2019-06-25 – 2019-07-05 (×11): 20 mg
  Filled 2019-06-24 (×11): qty 1

## 2019-06-24 NOTE — ED Notes (Signed)
Iv team at bedside  

## 2019-06-24 NOTE — ED Provider Notes (Addendum)
[facility] Emergency Department Provider Note       Time seen: ----------------------------------------- 7:30 PM on 06/24/2019 -----------------------------------------   I have reviewed the triage vital signs and the nursing notes.  HISTORY   Chief Complaint No chief complaint on file.    HPI Michelle [name] is a 59 y.o. female with a history of allergies, anxiety, asthma, CHF, COPD, hepatitis C, hyperlipidemia, pneumothorax, tracheostomy who presents to the ED for shortness of breath with yellow productive sputum.  She denies any pain on arrival, is on 4 L of oxygen all the time.  Was not hypoxic in route.  Past Medical History:  Diagnosis Date  . Allergy   . Anxiety   . Asthma   . CHF (congestive heart failure) ([place])   . Cocaine abuse ([place])   . COPD (chronic obstructive pulmonary disease) ([place])   . Coronary artery disease   . Emphysema/COPD ([place])   . Hepatitis C   . High cholesterol   . Hypertension   . Pneumothorax   . Tobacco abuse     Patient Active Problem List   Diagnosis Date Noted  . Chronic respiratory failure requiring continuous mechanical ventilation through tracheostomy ([place]) 10/20/2018  . CAD (coronary artery disease) 10/20/2018  . HTN (hypertension) 10/20/2018  . Chronic diastolic CHF (congestive heart failure) ([place]) 10/20/2018  . Pneumonia 09/17/2018  . Panic disorder 10/29/2017  . Altered mental status   . Pressure injury of skin 10/23/2017  . Acute on chronic respiratory failure with hypoxia and hypercapnia ([place]) 06/29/2017  . Acute on chronic respiratory failure with hypoxia ([place]) 02/23/2017  . Protein-calorie malnutrition, severe 05/23/2016  . Sepsis ([place]) 05/22/2016  . Malnutrition of moderate degree ([place]) 10/25/2014  . COPD exacerbation ([place]) 10/24/2014  . Tobacco abuse 10/24/2014    Past Surgical History:  Procedure Laterality Date  . CARDIAC CATHETERIZATION    . CHEST TUBE INSERTION    . IR GASTROSTOMY TUBE  MOD SED  10/31/2017  . TRACHEOSTOMY TUBE PLACEMENT N/A 10/17/2017   Procedure: TRACHEOSTOMY;  Surgeon: Carloyn Manner, MD;  Location: [facility] ORS;  Service: ENT;  Laterality: N/A;    Allergies Amoxicillin  Social History Social History   Tobacco Use  . Smoking status: Former Smoker    Packs/day: 0.10    Years: 41.00    Pack years: 4.10    Types: Cigarettes    Quit date: 06/09/2017    Years since quitting: 2.0  . Smokeless tobacco: Never Used  Substance Use Topics  . Alcohol use: No  . Drug use: Yes    Types: "Crack" cocaine    Comment: Past cocaine use.  Quit 04/2017    Review of Systems Constitutional: Negative for fever. Cardiovascular: Negative for chest pain. Respiratory: Positive for shortness of breath, cough Gastrointestinal: Negative for abdominal pain, vomiting and diarrhea. Musculoskeletal: Negative for back pain. Skin: Negative for rash. Neurological: Negative for headaches, focal weakness or numbness.  All systems negative/normal/unremarkable except as stated in the HPI  ____________________________________________   PHYSICAL EXAM:  VITAL SIGNS: ED Triage Vitals  Enc Vitals Group     BP      Pulse      Resp      Temp      Temp src      SpO2      Weight      Height      Head Circumference      Peak Flow      Pain Score  Pain Loc      Pain Edu?      Excl. in [place]?     Constitutional: Alert and oriented.  Chronically ill-appearing, no obvious distress Eyes: Conjunctivae are normal. Normal extraocular movements. ENT      Head: Normocephalic and atraumatic.  Facial edema is noted      Nose: No congestion/rhinnorhea.      Mouth/Throat: Mucous membranes are moist.      Neck: No stridor. Cardiovascular: Normal rate, regular rhythm. No murmurs, rubs, or gallops. Respiratory: Normal respiratory effort without tachypnea nor retractions.  Scattered rhonchi are noted Gastrointestinal: Soft and nontender. Normal bowel sounds Musculoskeletal:  Nontender with normal range of motion in extremities.  Edema is noted Neurologic:  Normal speech and language. No gross focal neurologic deficits are appreciated.  Skin:  Skin is warm, dry and intact. No rash noted. Psychiatric: Mood and affect are normal. Speech and behavior are normal.  ____________________________________________  EKG: Interpreted by me.  Sinus rhythm with rate of 69 bpm, right bundle branch block, normal axis, long QT  ____________________________________________  ED COURSE:  As part of my medical decision making, I reviewed the following data within the [place] History obtained from family if available, nursing notes, old chart and ekg, as well as notes from prior ED visits. Patient presented for cough, we will assess with labs and imaging as indicated at this time.   Procedures  Michelle [name] was evaluated in Emergency Department on 06/24/2019 for the symptoms described in the history of present illness. She was evaluated in the context of the global COVID-19 pandemic, which necessitated consideration that the patient might be at risk for infection with the SARS-CoV-2 virus that causes COVID-19. Institutional protocols and algorithms that pertain to the evaluation of patients at risk for COVID-19 are in a state of rapid change based on information released by regulatory bodies including the CDC and federal and state organizations. These policies and algorithms were followed during the patient's care in the ED.  ____________________________________________   LABS (pertinent positives/negatives)  Labs Reviewed  CBC WITH DIFFERENTIAL/PLATELET - Abnormal; Notable for the following components:      Result Value   RBC 5.20 (*)    MCH 25.2 (*)    All other components within normal limits  BASIC METABOLIC PANEL - Abnormal; Notable for the following components:   Sodium 133 (*)    Potassium 3.0 (*)    Chloride 97 (*)    Glucose, Bld 563 (*)     Creatinine, Ser 1.01 (*)    Calcium 8.8 (*)    All other components within normal limits  BLOOD GAS, VENOUS - Abnormal; Notable for the following components:   pCO2, Ven 43 (*)    pO2, Ven 46.0 (*)    All other components within normal limits  TROPONIN I (HIGH SENSITIVITY) - Abnormal; Notable for the following components:   Troponin I (High Sensitivity) 20 (*)    All other components within normal limits  CULTURE, RESPIRATORY  SARS CORONAVIRUS 2 (TAT 6-24 HRS)  BRAIN NATRIURETIC PEPTIDE  URINALYSIS, COMPLETE (UACMP) WITH MICROSCOPIC  POC SARS CORONAVIRUS 2 AG -  ED  POC SARS CORONAVIRUS 2 AG   CRITICAL CARE Performed by: Laurence Aly   Total critical care time: 30 minutes  Critical care time was exclusive of separately billable procedures and treating other patients.  Critical care was necessary to treat or prevent imminent or life-threatening deterioration.  Critical care was time spent personally  by me on the following activities: development of treatment plan with patient and/or surrogate as well as nursing, discussions with consultants, evaluation of patient's response to treatment, examination of patient, obtaining history from patient or surrogate, ordering and performing treatments and interventions, ordering and review of laboratory studies, ordering and review of radiographic studies, pulse oximetry and re-evaluation of patient's condition.  RADIOLOGY Images were viewed by me  Chest x-ray  IMPRESSION:  COPD changes with minimal effusion and atelectasis at LEFT base.  ____________________________________________   DIFFERENTIAL DIAGNOSIS   COVID-19, pneumonia, aspiration pneumonia, CHF, COPD, pneumothorax  FINAL ASSESSMENT AND PLAN  Cough, new onset diabetes.  Plan: The patient had presented for productive cough with a tracheostomy. Patient's labs are still pending at this time, rapid Covid is negative.  Surprisingly blood sugar was 563 which is new onset  diabetes.  We have started her on normal saline bolus and I will start Metformin as well.  Patient's imaging did not reveal any obvious community-acquired pneumonia.  I have ordered IV Levaquin for her productive cough.   Laurence Aly, MD    Note: This note was generated in part or whole with voice recognition software. Voice recognition is usually quite accurate but there are transcription errors that can and very often do occur. I apologize for any typographical errors that were not detected and corrected.     Earleen Newport, MD 06/24/19 2118    Earleen Newport, MD 06/24/19 2159

## 2019-06-24 NOTE — ED Triage Notes (Signed)
Patient arrives from home via [facility] with her daughter.  Per EMS, daughter reports she has had a cough producing yellow sputum for one week.  Her doctor wanted her to come to the ER to be checked for infection.  Daughter states the patient had a period of two days with altered mental state (hallucinations) approx 6 days ago (last Thurs and Fri).  She stays on 4L at home.  EMS:  BP 175/85, HR 76, SpO2 99% on 4L.

## 2019-06-24 NOTE — H&P (Addendum)
History and Physical    Michelle [name] [id number] DOB: [birth year] DOA: 06/24/2019  PCP: Frazier Richards, MD    Patient coming from: Home    Chief Complaint: Shortness of breath with yellow productive sputum Denies any fever chills  HPI: Michelle [name] is a 59 y.o. female with medical history significant of COPD, CHF, hyperlipidemia, coronary artery disease, hypertension, hypercholesterolemia, status post tracheostomy, came with a chief complaint of mild shortness of breath with yellow productive sputum.  She is on 4 L of oxygen all the time.  ED Course: In the emergency room in mild respiratory distress With chest x-ray small lower lobe infiltrate, potassium 3.0, blood sugar 563, normal anion gap normal BUN and creatinine BNP 42 troponin 20,, normal white count, hemoglobin 13.1 Electrocardiogram normal sinus with right bundle branch block  Review of Systems: As per HPI otherwise 10 point review of systems negative with exception of shortness of.  Breath and productive cough  Past Medical History:  Diagnosis Date  . Allergy   . Anxiety   . Asthma   . CHF (congestive heart failure) ([place])   . Cocaine abuse ([place])   . COPD (chronic obstructive pulmonary disease) ([place])   . Coronary artery disease   . Emphysema/COPD ([place])   . Hepatitis C   . High cholesterol   . Hypertension   . Pneumothorax   . Tobacco abuse     Past Surgical History:  Procedure Laterality Date  . CARDIAC CATHETERIZATION    . CHEST TUBE INSERTION    . IR GASTROSTOMY TUBE MOD SED  10/31/2017  . TRACHEOSTOMY TUBE PLACEMENT N/A 10/17/2017   Procedure: TRACHEOSTOMY;  Surgeon: Carloyn Manner, MD;  Location: [facility] ORS;  Service: ENT;  Laterality: N/A;     reports that she quit smoking about 2 years ago. Her smoking use included cigarettes. She has a 4.10 pack-year smoking history. She has never used smokeless tobacco. She reports current drug use. Drug: "Crack" cocaine. She reports that she does not drink  alcohol.  Allergies  Allergen Reactions  . Amoxicillin     Patient has tolerated cephalosporins in the past    Family History  Problem Relation Age of Onset  . Lung cancer Mother   . Asthma Mother   . CVA Father   . CAD Father   . Stroke Father   . Breast cancer Maternal Aunt 67  . COPD Sister      Prior to Admission medications   Medication Sig Start Date End Date Taking? Authorizing Provider  aspirin 81 MG chewable tablet Chew 81 mg by mouth daily.    Yes [provider]  budesonide (PULMICORT) 0.25 MG/2ML nebulizer solution Take 2 mLs (0.25 mg total) by nebulization 2 (two) times daily. 12/31/17  Yes Conforti, John, DO  cetirizine (ZYRTEC) 10 MG tablet Place 10 mg into feeding tube daily.    Yes [provider]  clonazePAM (KLONOPIN) 1 MG tablet Take 2 mg by mouth 2 (two) times daily.   Yes [provider]  famotidine (PEPCID) 20 MG tablet Place 1 tablet (20 mg total) into feeding tube daily. 12/31/17  Yes Conforti, John, DO  fentaNYL (DURAGESIC - DOSED MCG/HR) 25 MCG/HR patch Place 1 patch (25 mcg total) onto the skin every 3 (three) days. 01/07/18  Yes Kasa, Maretta Bees, MD  fluticasone (FLONASE) 50 MCG/ACT nasal spray Place 1 spray into both nostrils daily.    Yes [provider]  hydrochlorothiazide (MICROZIDE) 12.5 MG capsule Take 12.5 mg by  mouth daily.   Yes [provider]  ipratropium-albuterol (DUONEB) 0.5-2.5 (3) MG/3ML SOLN Take 3 mLs by nebulization every 6 (six) hours. Patient taking differently: Take 3 mLs by nebulization every 6 (six) hours as needed (for shortness of breath).  12/31/17  Yes Conforti, John, DO  mirtazapine (REMERON) 30 MG tablet Take 30 mg by mouth at bedtime.   Yes [provider]  oxyCODONE-acetaminophen (PERCOCET/ROXICET) 5-325 MG tablet Take 1 tablet by mouth every 8 (eight) hours as needed.   Yes [provider]  PARoxetine (PAXIL) 20 MG tablet Place 1 tablet (20 mg total) into feeding tube  at bedtime. Patient taking differently: Place 20 mg into feeding tube daily.  12/31/17  Yes Conforti, John, DO  polyethylene glycol (MIRALAX / GLYCOLAX) 17 g packet Place 17 g into feeding tube daily.   Yes [provider]  QUEtiapine (SEROQUEL) 25 MG tablet Place 1 tablet (25 mg total) into feeding tube at bedtime. 12/31/17  Yes Conforti, John, DO  senna (SENOKOT) 8.6 MG TABS tablet Place 1-2 tablets into feeding tube at bedtime.    Yes [provider]  simvastatin (ZOCOR) 20 MG tablet Take 40 mg by mouth every evening.    Yes [provider]  AMBULATORY NON FORMULARY MEDICATION Medication Name: Pulse oximeter DME:[facility] DX J44.9....[id number] Patient not taking: Reported on 06/24/2019 01/08/18   Flora Lipps, MD  ciprofloxacin (CIPRO) 750 MG tablet Place 1 tablet (750 mg total) into feeding tube 2 (two) times daily. Patient not taking: Reported on 06/24/2019 03/14/19   Flora Lipps, MD  formoterol (PERFOROMIST) 20 MCG/2ML nebulizer solution Take 2 mLs (20 mcg total) by nebulization 2 (two) times daily. Patient not taking: Reported on 03/08/2019 12/08/18   Flora Lipps, MD    Physical Exam: Vitals:   06/24/19 2030 06/24/19 2100 06/24/19 2130 06/24/19 2218  BP: 116/86 122/66 108/84 (!) 157/98  Pulse: 68 74 82 66  Resp: 18 (!) 21 (!) 21 20  Temp:      TempSrc:      SpO2: 100% 100% 100% 100%  Weight:      Height:        Constitutional: NAD, calm, comfortable Vitals:   06/24/19 2030 06/24/19 2100 06/24/19 2130 06/24/19 2218  BP: 116/86 122/66 108/84 (!) 157/98  Pulse: 68 74 82 66  Resp: 18 (!) 21 (!) 21 20  Temp:      TempSrc:      SpO2: 100% 100% 100% 100%  Weight:      Height:       Eyes: PERRL, lids and conjunctivae normal ENMT: Mucous membranes are moist. Posterior pharynx clear of any exudate or lesions.Normal dentition.  Neck: normal, supple, no masses, no thyromegaly Respiratory: clear to auscultation bilaterally, wheezing left anterior chest wall / no  crackles. Normal respiratory effort. No accessory muscle use.  Cardiovascular: Regular rate and rhythm, no murmurs / rubs / gallops. No extremity edema. 2+ pedal pulses. No carotid bruits.  Abdomen: no tenderness, no masses palpated. No hepatosplenomegaly. Bowel sounds positive.  PEG tube in place no signs of infection around Musculoskeletal: no clubbing / cyanosis. No joint deformity upper and lower extremities. Good ROM, no contractures. Normal muscle tone.  Skin: no rashes, lesions, ulcers. No induration Neurologic: CN 2-12 grossly intact.  No signs of new focal neurologic deficit Psychiatric: Normal judgment and insight. Alert and oriented x 3. Normal mood.    Labs on Admission: I have personally reviewed following labs and imaging studies  CBC: Recent Labs  Lab 06/24/19 2057  WBC 4.8  NEUTROABS 2.9  HGB 13.1  HCT 42.1  MCV 81.0  PLT [id number]   Basic Metabolic Panel: Recent Labs  Lab 06/24/19 2057  NA 133*  K 3.0*  CL 97*  CO2 23  GLUCOSE 563*  BUN 7  CREATININE 1.01*  CALCIUM 8.8*   GFR: Estimated Creatinine Clearance: 55.3 mL/min (A) (by C-G formula based on SCr of 1.01 mg/dL (H)). Liver Function Tests: No results for input(s): AST, ALT, ALKPHOS, BILITOT, PROT, ALBUMIN in the last 168 hours. No results for input(s): LIPASE, AMYLASE in the last 168 hours. No results for input(s): AMMONIA in the last 168 hours. Coagulation Profile: No results for input(s): INR, PROTIME in the last 168 hours. Cardiac Enzymes: No results for input(s): CKTOTAL, CKMB, CKMBINDEX, TROPONINI in the last 168 hours. BNP (last 3 results) No results for input(s): PROBNP in the last 8760 hours. HbA1C: No results for input(s): HGBA1C in the last 72 hours. CBG: No results for input(s): GLUCAP in the last 168 hours. Lipid Profile: No results for input(s): CHOL, HDL, LDLCALC, TRIG, CHOLHDL, LDLDIRECT in the last 72 hours. Thyroid Function Tests: No results for input(s): TSH, T4TOTAL, FREET4,  T3FREE, THYROIDAB in the last 72 hours. Anemia Panel: No results for input(s): VITAMINB12, FOLATE, FERRITIN, TIBC, IRON, RETICCTPCT in the last 72 hours. Urine analysis:    Component Value Date/Time   COLORURINE YELLOW (A) 06/24/2019 2058   APPEARANCEUR CLEAR (A) 06/24/2019 2058   APPEARANCEUR Clear 10/30/2013 1230   LABSPEC 1.033 (H) 06/24/2019 2058   LABSPEC 1.005 10/30/2013 1230   PHURINE 6.0 06/24/2019 2058   GLUCOSEU >=500 (A) 06/24/2019 2058   GLUCOSEU Negative 10/30/2013 1230   HGBUR SMALL (A) 06/24/2019 2058   BILIRUBINUR NEGATIVE 06/24/2019 2058   BILIRUBINUR Negative 10/30/2013 1230   KETONESUR 5 (A) 06/24/2019 2058   PROTEINUR 100 (A) 06/24/2019 2058   NITRITE NEGATIVE 06/24/2019 2058   LEUKOCYTESUR TRACE (A) 06/24/2019 2058   LEUKOCYTESUR Negative 10/30/2013 1230    Radiological Exams on Admission: DG Chest Port 1 View  Result Date: 06/24/2019 CLINICAL DATA:  Dyspnea, productive cough with yellow sputum, altered mental status; past history of CHF, COPD, coronary artery disease, hepatitis-C, hypertension EXAM: PORTABLE CHEST 1 VIEW COMPARISON:  Portable exam 1939 hours compared to 03/14/2019 FINDINGS: Tracheostomy tube stable. Normal heart size, mediastinal contours, and pulmonary vascularity. Emphysematous changes especially RIGHT upper lobe. Minimal LEFT pleural effusion and lingular subsegmental atelectasis. Remaining lungs clear. No infiltrate or pneumothorax. Osseous structures unremarkable. IMPRESSION: COPD changes with minimal effusion and atelectasis at LEFT base. Electronically Signed   By: Lavonia Dana M.D.   On: 06/24/2019 20:24    EKG: Independently reviewed.  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 antidiabetics at this point  Hypokalemia Add potassium check magnesium  Chronic pain medication Resume fentanyl and oxycodone  Hypercholesterolemia Zocor  Coronary artery disease Aspirin Zocor  COPD stable Resume inhaler  Status post PEG tube Flushes normal saline every 6 hours  Dysphagia Per family patient can have soft diet     DVT prophylaxis: Lovenox Code Status: DNR Family Communication: Family at the bedside Disposition Plan: Admission Consults called: No Admission status: Full admission   Kamin Niblack G Delecia Vastine MD [facility]  If 7PM-7AM, please contact night-coverage [email or web address]   06/24/2019, 11:45 PM

## 2019-06-24 NOTE — ED Notes (Signed)
Pt requesting breathing tx.

## 2019-06-24 NOTE — ED Notes (Signed)
Date and time results received: 06/24/19 2154 Test: Glucose Critical Value: 563  Name of Provider Notified: Jimmye Norman

## 2019-06-24 NOTE — ED Notes (Signed)
2 unsuccessful US-guided IV attempts by this RN (right and left AC). IV team consult order placed d/t pt being a difficult IV start.

## 2019-06-25 LAB — COMPREHENSIVE METABOLIC PANEL
ALT: 23 U/L (ref 0–44)
AST: 41 U/L (ref 15–41)
Albumin: 3.6 g/dL (ref 3.5–5.0)
Alkaline Phosphatase: 121 U/L (ref 38–126)
Anion gap: 12 (ref 5–15)
BUN: 7 mg/dL (ref 6–20)
CO2: 24 mmol/L (ref 22–32)
Calcium: 8.4 mg/dL — ABNORMAL LOW (ref 8.9–10.3)
Chloride: 101 mmol/L (ref 98–111)
Creatinine, Ser: 0.75 mg/dL (ref 0.44–1.00)
GFR calc Af Amer: >60 mL/min (ref >60)
GFR calc non Af Amer: >60 mL/min (ref >60)
Glucose, Bld: 317 mg/dL — ABNORMAL HIGH (ref 70–99)
Potassium: 2.9 mmol/L — ABNORMAL LOW (ref 3.5–5.1)
Sodium: 137 mmol/L (ref 135–145)
Total Bilirubin: 0.7 mg/dL (ref 0.3–1.2)
Total Protein: 7.5 g/dL (ref 6.5–8.1)

## 2019-06-25 LAB — GLUCOSE, CAPILLARY
Glucose-Capillary: 312 mg/dL — ABNORMAL HIGH (ref 70–99)
Glucose-Capillary: 238 mg/dL — ABNORMAL HIGH (ref 70–99)
Glucose-Capillary: 318 mg/dL — ABNORMAL HIGH (ref 70–99)
Glucose-Capillary: 288 mg/dL — ABNORMAL HIGH (ref 70–99)
Glucose-Capillary: 408 mg/dL — ABNORMAL HIGH (ref 70–99)
Glucose-Capillary: 323 mg/dL — ABNORMAL HIGH (ref 70–99)

## 2019-06-25 LAB — MRSA PCR SCREENING: MRSA by PCR: POSITIVE — AB

## 2019-06-25 LAB — CBC
HCT: 38.5 % (ref 36.0–46.0)
Hemoglobin: 12.3 g/dL (ref 12.0–15.0)
MCH: 25.7 pg — ABNORMAL LOW (ref 26.0–34.0)
MCHC: 31.9 g/dL (ref 30.0–36.0)
MCV: 80.4 fL (ref 80.0–100.0)
Platelets: 229 10*3/uL (ref 150–400)
RBC: 4.79 MIL/uL (ref 3.87–5.11)
RDW: 14.9 % (ref 11.5–15.5)
WBC: 5.8 10*3/uL (ref 4.0–10.5)
nRBC: 0 % (ref 0.0–0.2)

## 2019-06-25 LAB — MAGNESIUM: Magnesium: 2.1 mg/dL (ref 1.7–2.4)

## 2019-06-25 LAB — HEMOGLOBIN A1C
Hgb A1c MFr Bld: 11.8 % — ABNORMAL HIGH (ref 4.8–5.6)
Mean Plasma Glucose: 291.96 mg/dL

## 2019-06-25 LAB — SARS CORONAVIRUS 2 (TAT 6-24 HRS): SARS Coronavirus 2: NEGATIVE

## 2019-06-25 MED ORDER — ONDANSETRON 4 MG PO TBDP
4.0000 mg | ORAL_TABLET | Freq: Three times a day (TID) | ORAL | Status: DC | PRN
  Administered 2019-07-03: 21:00:00 4 mg via ORAL
  Filled 2019-06-25 (×2): qty 1

## 2019-06-25 MED ORDER — ONDANSETRON HCL 4 MG/2ML IJ SOLN
4.0000 mg | Freq: Four times a day (QID) | INTRAMUSCULAR | Status: DC | PRN
  Administered 2019-06-25 – 2019-07-01 (×5): 4 mg via INTRAVENOUS
  Filled 2019-06-25 (×5): qty 2

## 2019-06-25 MED ORDER — INSULIN GLARGINE 100 UNIT/ML ~~LOC~~ SOLN
20.0000 [IU] | Freq: Every day | SUBCUTANEOUS | Status: DC
  Administered 2019-06-25 – 2019-06-26 (×2): 20 [IU] via SUBCUTANEOUS
  Filled 2019-06-25 (×3): qty 0.2

## 2019-06-25 MED ORDER — CHLORHEXIDINE GLUCONATE CLOTH 2 % EX PADS
6.0000 | MEDICATED_PAD | Freq: Every day | CUTANEOUS | Status: DC
  Administered 2019-06-25: 6 via TOPICAL

## 2019-06-25 MED ORDER — POTASSIUM CHLORIDE 10 MEQ/100ML IV SOLN
10.0000 meq | INTRAVENOUS | Status: AC
  Administered 2019-06-25 (×4): 10 meq via INTRAVENOUS
  Filled 2019-06-25 (×4): qty 100

## 2019-06-25 MED ORDER — POLYETHYLENE GLYCOL 3350 17 G PO PACK
17.0000 g | PACK | Freq: Two times a day (BID) | ORAL | Status: DC | PRN
  Filled 2019-06-25: qty 1

## 2019-06-25 MED ORDER — CHLORHEXIDINE GLUCONATE CLOTH 2 % EX PADS
6.0000 | MEDICATED_PAD | Freq: Every day | CUTANEOUS | Status: AC
  Administered 2019-06-26 – 2019-06-28 (×3): 6 via TOPICAL

## 2019-06-25 MED ORDER — DOCUSATE SODIUM 100 MG PO CAPS: 100.0000 mg | ORAL_CAPSULE | Freq: Two times a day (BID) | ORAL | Status: DC | PRN

## 2019-06-25 MED ORDER — GUAIFENESIN-DM 100-10 MG/5ML PO SYRP
10.0000 mL | ORAL_SOLUTION | Freq: Four times a day (QID) | ORAL | Status: DC | PRN
  Filled 2019-06-25: qty 10

## 2019-06-25 MED ORDER — HEPARIN (PORCINE) 25000 UT/250ML-% IV SOLN
INTRAVENOUS | Status: AC
  Filled 2019-06-25: qty 250

## 2019-06-25 MED ORDER — INSULIN GLARGINE 100 UNIT/ML ~~LOC~~ SOLN: 10.0000 [IU] | Freq: Every day | SUBCUTANEOUS | Status: DC

## 2019-06-25 MED ORDER — CALCIUM CARBONATE ANTACID 500 MG PO CHEW: 1.0000 | CHEWABLE_TABLET | Freq: Three times a day (TID) | ORAL | Status: DC | PRN

## 2019-06-25 MED ORDER — ALUM & MAG HYDROXIDE-SIMETH 200-200-20 MG/5ML PO SUSP
15.0000 mL | Freq: Four times a day (QID) | ORAL | Status: DC | PRN
  Filled 2019-06-25: qty 30

## 2019-06-25 MED ORDER — MUPIROCIN 2 % EX OINT
1.0000 "application " | TOPICAL_OINTMENT | Freq: Two times a day (BID) | CUTANEOUS | Status: AC
  Administered 2019-06-25 – 2019-06-29 (×7): 1 via NASAL
  Filled 2019-06-25 (×2): qty 22

## 2019-06-25 MED ORDER — PNEUMOCOCCAL VAC POLYVALENT 25 MCG/0.5ML IJ INJ
0.5000 mL | INJECTION | INTRAMUSCULAR | Status: DC
  Filled 2019-06-25: qty 0.5

## 2019-06-25 NOTE — Progress Notes (Signed)
MD notified of pt's CBG.

## 2019-06-25 NOTE — ED Notes (Signed)
Respiratory in with pt

## 2019-06-25 NOTE — ED Notes (Signed)
This RN spoke w/ pt's daughter and updated on POC and current status.

## 2019-06-25 NOTE — ED Notes (Signed)
Pt resting in bed, no distress noted. Call light in reach, bed locked and low.

## 2019-06-25 NOTE — Progress Notes (Addendum)
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)    Michelle [name] is a 59 y.o. female with medical history significant of COPD, CHF, hyperlipidemia, coronary artery disease, hypertension, hypercholesterolemia, status post tracheostomy, came with a chief complaint of mild shortness of breath with yellow productive sputum.  She is on 4 L of oxygen all the time.   Acute on chronic respiratory failure with hypoxemia Secondary to pneumonia --baseline 4L O2 Plan DuoNebs, Pulmicort Titrate oxygen keep saturation more than 88% Continue with antibiotics Levaquin  Questionable left lower lobe infiltrate  on chest x-ray Wheezing diminished breath sounds on the left side Patient started on Levaquin in the emergency room --continue with IV Levaquin and sputum culture via tracheostomy  Hyperglycemia New onset diabetes mellitus type 2 Plan insulin sliding scale per sensitivity factor Hemoglobin A1c 11.8 --Add Lantus 20u nightly --SSI q4h  Hypokalemia Replete potassium   Chronic pain medication Continue fentanyl and oxycodone  Hypercholesterolemia Zocor  Coronary artery disease Aspirin Zocor  COPD stable continue inhaler  Status post PEG tube Flushes normal saline every 6 hours  Dysphagia Per family patient can have soft diet  Anxiety and depression --continue home Remeron, paxil, seroquel   FEN: tube feed and soft diet DVT prophylaxis: Lovenox SQ Code Status: DNR  Family Communication: not today Disposition Plan: back to SNF   Subjective  and Interval History:  Pt complained of pain, dyspnea, cough, tenderness around her PEG tube.  No fever, chest pain, N/V/D, dysuria, increased swelling.    Objective: Vitals:   06/25/19 1937 06/25/19 2000 06/25/19 2100 06/25/19 2200  BP:  119/73 128/87 (!) 137/95  Pulse:  64 65 62  Resp:  20 20 19   Temp:  98.3 F (36.8 C)    TempSrc:  Oral    SpO2: 100% 100% 100% 98%  Weight:      Height:        Intake/Output Summary (Last 24 hours) at 06/25/2019 2228 Last data filed at 06/25/2019 2216 Gross per 24 hour  Intake 1938.96 ml  Output 0 ml  Net 1938.96 ml   Filed Weights   06/24/19 1936 06/25/19 1343  Weight: 72.6 kg 89.5 kg    Examination:   Constitutional: NAD, AAOx3, lethargic HEENT: conjunctivae and lids normal, EOMI CV: RRR no M,R,G. Distal pulses +2.  No cyanosis.   RESP: Diffuse Rhonchi, trach collar on connected to suppl O2 GI: +BS, distended, tenderness around the PEG tube side Extremities: No effusions, edema, or tenderness in BLE SKIN: warm, dry and intact Neuro: II - XII grossly intact.  Sensation intact Psych: Depressed mood and affect.     Data Reviewed: I have personally reviewed following labs and imaging studies  CBC: Recent Labs  Lab 06/24/19 2057 06/25/19 0328  WBC 4.8 5.8  NEUTROABS 2.9  --   HGB 13.1 12.3  HCT 42.1 38.5  MCV 81.0 80.4  PLT 213 [id number]   Basic Metabolic Panel: Recent Labs  Lab 06/24/19 2057 06/25/19 0328  NA 133* 137  K 3.0* 2.9*  CL 97* 101  CO2 23 24  GLUCOSE 563* 317*  BUN 7 7  CREATININE 1.01* 0.75  CALCIUM 8.8* 8.4*  MG  --  2.1   GFR: Estimated Creatinine Clearance: 76.4 mL/min (by C-G formula based on SCr of 0.75 mg/dL). Liver Function Tests: Recent Labs  Lab 06/25/19 0328  AST 41  ALT 23  ALKPHOS 121  BILITOT 0.7  PROT 7.5  ALBUMIN 3.6   No results for input(s): LIPASE, AMYLASE in the last 168 hours. No results for input(s): AMMONIA in the last 168 hours. Coagulation Profile: No results for  input(s): INR, PROTIME in the last 168 hours. Cardiac Enzymes: No results for input(s): CKTOTAL, CKMB, CKMBINDEX, TROPONINI in the last 168 hours. BNP (last 3 results) No results for input(s): PROBNP in the last 8760 hours. HbA1C: Recent Labs    06/25/19 0328  HGBA1C 11.8*   CBG: Recent Labs  Lab 06/25/19 0745 06/25/19 1231 06/25/19 1336 06/25/19 1519 06/25/19 1938  GLUCAP 238* 318* 288* 408* 323*   Lipid Profile: No results for input(s): CHOL, HDL, LDLCALC, TRIG, CHOLHDL, LDLDIRECT in the last 72 hours. Thyroid Function Tests: No results for input(s): TSH, T4TOTAL, FREET4, T3FREE, THYROIDAB in the last 72 hours. Anemia Panel: No results for input(s): VITAMINB12, FOLATE, FERRITIN, TIBC, IRON, RETICCTPCT in the last 72 hours. Sepsis Labs: No results for input(s): PROCALCITON, LATICACIDVEN in the last 168 hours.  Recent Results (from the past 240 hour(s))  Culture, respiratory     Status: None (Preliminary result)   Collection Time: 06/24/19  7:44 PM   Specimen: Tracheal Aspirate  Result Value Ref Range Status   Specimen Description   Final    TRACHEAL ASPIRATE Performed at [facility], [address] [place], [place] [address]    Special Requests   Final    NONE Performed at [place], [place] [address]    Gram Stain   Final    RARE WBC PRESENT,BOTH PMN AND MONONUCLEAR ABUNDANT GRAM NEGATIVE RODS RARE GRAM POSITIVE RODS RARE GRAM POSITIVE COCCI IN PAIRS RARE BUDDING YEAST SEEN Performed at [facility], [address], [place], [place] [address]    Culture PENDING  Incomplete   Report Status PENDING  Incomplete  SARS CORONAVIRUS 2 (TAT 6-24 HRS) Nasopharyngeal Nasopharyngeal Swab     Status: None   Collection Time: 06/24/19  7:45 PM   Specimen: Nasopharyngeal Swab  Result Value Ref Range Status   SARS Coronavirus 2 NEGATIVE NEGATIVE Final    Comment: (NOTE) SARS-CoV-2 target nucleic acids are NOT  DETECTED. The SARS-CoV-2 RNA is generally detectable in upper and lower respiratory specimens during the acute phase of infection. Negative results do not preclude SARS-CoV-2 infection, do not rule out co-infections with other pathogens, and should not be used as the sole basis for treatment or other patient management decisions. Negative results must be combined with clinical observations, patient history, and epidemiological information. The expected result is Negative. Fact Sheet for Patients: SugarRoll.be Fact Sheet for Healthcare Providers: https://www.woods-mathews.com/ This test is not yet approved or cleared by the [place] FDA and  has been authorized for detection and/or diagnosis of SARS-CoV-2 by FDA under an Emergency Use Authorization (EUA). This EUA will remain  in effect (meaning this test can be used) for the duration of the COVID-19 declaration under Section 56 4(b)(1) of the Act, 21 U.S.C. section 360bbb-3(b)(1), unless the authorization is terminated or revoked sooner. Performed at [facility], [place] [address], [place], [place] [address]  MRSA PCR Screening     Status: Abnormal   Collection Time: 06/25/19  1:40 PM   Specimen: Nasal Mucosa; Nasopharyngeal  Result Value Ref Range Status   MRSA by PCR POSITIVE (A) NEGATIVE Final    Comment:        The GeneXpert MRSA Assay (FDA approved for NASAL specimens only), is one component of a comprehensive MRSA colonization surveillance program. It is not intended to diagnose MRSA infection nor to guide or monitor treatment for MRSA infections. RESULT CALLED TO, READ BACK BY AND VERIFIED WITH: Adalberto Ill 06/25/19 at 1516. CST Performed at [facility], [address] [place], [place] [address]       Radiology Studies: DG Chest [place] 1 View  Result Date: 06/24/2019 CLINICAL DATA:  Dyspnea, productive cough with yellow sputum, altered mental status;  past history of CHF, COPD, coronary artery disease, hepatitis-C, hypertension EXAM: PORTABLE CHEST 1 VIEW COMPARISON:  Portable exam 1939 hours compared to 03/14/2019 FINDINGS: Tracheostomy tube stable. Normal heart size, mediastinal contours, and pulmonary vascularity. Emphysematous changes especially RIGHT upper lobe. Minimal LEFT pleural effusion and lingular subsegmental atelectasis. Remaining lungs clear. No infiltrate or pneumothorax. Osseous structures unremarkable. IMPRESSION: COPD changes with minimal effusion and atelectasis at LEFT base. Electronically Signed   By: Lavonia Dana M.D.   On: 06/24/2019 20:24     Scheduled Meds: . aspirin  81 mg Oral Daily  . budesonide  0.25 mg Nebulization BID  . [START ON 06/26/2019] Chlorhexidine Gluconate Cloth  6 each Topical Daily  . clonazePAM  2 mg Oral BID  . enoxaparin (LOVENOX) injection  40 mg Subcutaneous Q24H  . famotidine  20 mg Per Tube Daily  . fentaNYL  1 patch Transdermal Q72H  . fluticasone  1 spray Each Nare Daily  . insulin aspart  0-15 Units Subcutaneous Q4H  . loratadine  10 mg Oral Daily  . mirtazapine  30 mg Oral QHS  . mupirocin ointment  1 application Nasal BID  . PARoxetine  20 mg Per Tube Daily  . [START ON 06/26/2019] pneumococcal 23 valent vaccine  0.5 mL Intramuscular Tomorrow-1000  . QUEtiapine  25 mg Per Tube QHS  . senna  1-2 tablet Per Tube QHS  . simvastatin  40 mg Oral QPM  . sodium chloride flush  3 mL Intravenous Q12H  . sodium chloride irrigation  50 mL Per Tube Q6H   Continuous Infusions: . sodium chloride    . 0.9 % NaCl with KCl 20 mEq / L 75 mL/hr at 06/25/19 2000  . heparin    . levofloxacin (LEVAQUIN) IV       LOS: 1 day     Enzo Bi, MD [facility] If 7PM-7AM, please contact night-coverage 06/25/2019, 10:28 PM

## 2019-06-25 NOTE — ED Notes (Signed)
This RN, Annie Main RN and Terri Piedra, RN cleaned pt, placed new sheets on the bed and placed new brief on pt.

## 2019-06-25 NOTE — Progress Notes (Signed)
Inpatient Diabetes Program Recommendations  AACE/ADA: New Consensus Statement on Inpatient Glycemic Control (2015)  Target Ranges:  Prepandial:   less than 140 mg/dL      Peak postprandial:   less than 180 mg/dL (1-2 hours)      Critically ill patients:  140 - 180 mg/dL   Lab Results  Component Value Date   GLUCAP 238 (H) 06/25/2019   HGBA1C 11.8 (H) 06/25/2019    Review of Glycemic Control Results for SHANNICE, [place] (MRN [id number]) as of 06/25/2019 11:38  Ref. Range 06/25/2019 05:27 06/25/2019 07:45  Glucose-Capillary Latest Ref Range: 70 - 99 mg/dL 312 (H) 238 (H)   Diabetes history: A1c 6.7 on 03/09/19 admission Outpatient Diabetes medications: None Current orders for Inpatient glycemic control: Novolog moderate correction q 4 hrs.  Inpatient Diabetes Program Recommendations:   Patient is currently in Emergency Department.On arrival to ED, blood glucose is 563. Noted patient was hospitalized in October 2020 with A1c 6.7.Now A1c 11.8. Will follow with admission.  Thank you, Nani Gasser. Delton Stelle, RN, MSN, CDE  Diabetes Coordinator Inpatient Glycemic Control Team Team Pager [phone] (8am-5pm) 06/25/2019 11:42 AM

## 2019-06-25 NOTE — Consult Note (Signed)
CRITICAL CARE PROGRESS NOTE    Name: Michelle [name] MRN: [id number] DOB: [birth year]     LOS: 1   SUBJECTIVE FINDINGS & SIGNIFICANT EVENTS   Patient description:   HPI: Michelle [name] is a 59 y.o. female with medical history significant of COPD, CHF, hyperlipidemia, coronary artery disease, hypertension, hypercholesterolemia, status post tracheostomy, came with a chief complaint of mild shortness of breath with yellow productive sputum.  She is on 4 L of oxygen all the time. In the emergency room in mild respiratory distressWith chest x-ray small lower lobe infiltrate, potassium 3.0, blood sugar 563, normal anion gap normal BUN and creatinine BNP 42 troponin 20,, normal white count, hemoglobin 13.1 Electrocardiogram normal sinus with right bundle branch block    Lines / Drains: PIVx2  Cultures / Sepsis markers: respiratory  Antibiotics: Levaquin   Protocols / Consultants: Hospitalist and critical care    PAST MEDICAL HISTORY   Past Medical History:  Diagnosis Date  . Allergy   . Anxiety   . Asthma   . CHF (congestive heart failure) ([place])   . Cocaine abuse ([place])   . COPD (chronic obstructive pulmonary disease) ([place])   . Coronary artery disease   . Emphysema/COPD ([place])   . Hepatitis C   . High cholesterol   . Hypertension   . Pneumothorax   . Tobacco abuse      SURGICAL HISTORY   Past Surgical History:  Procedure Laterality Date  . CARDIAC CATHETERIZATION    . CHEST TUBE INSERTION    . IR GASTROSTOMY TUBE MOD SED  10/31/2017  . TRACHEOSTOMY TUBE PLACEMENT N/A 10/17/2017   Procedure: TRACHEOSTOMY;  Surgeon: Carloyn Manner, MD;  Location: [facility] ORS;  Service: ENT;  Laterality: N/A;     FAMILY HISTORY   Family History  Problem Relation Age of Onset  . Lung cancer Mother   . Asthma  Mother   . CVA Father   . CAD Father   . Stroke Father   . Breast cancer Maternal Aunt 67  . COPD Sister      SOCIAL HISTORY   Social History   Tobacco Use  . Smoking status: Former Smoker    Packs/day: 0.10    Years: 41.00    Pack years: 4.10    Types: Cigarettes    Quit date: 06/09/2017    Years since quitting: 2.0  . Smokeless tobacco: Never Used  Substance Use Topics  . Alcohol use: No  . Drug use: Yes    Types: "Crack" cocaine    Comment: Past cocaine use.  Quit 04/2017     MEDICATIONS   Current Medication:  Current Facility-Administered Medications:  .  0.9 %  sodium chloride infusion, 250 mL, Intravenous, PRN, Cristescu, Mircea G, MD .  0.9 % NaCl with KCl 20 mEq/ L  infusion, , Intravenous, Continuous, Cristescu, Mircea G, MD, Last Rate: 75 mL/hr at 06/25/19 1817, New Bag at 06/25/19 1817 .  alum & mag hydroxide-simeth (MAALOX/MYLANTA) 200-200-20 MG/5ML suspension 15 mL, 15 mL, Oral, Q6H PRN, Enzo Bi, MD .  aspirin chewable tablet 81 mg, 81 mg, Oral, Daily, Cristescu, Mircea G, MD, 81 mg at 06/25/19 1010 .  budesonide (PULMICORT) nebulizer solution 0.25 mg, 0.25 mg, Nebulization, BID, Cristescu, Mircea G, MD, 0.25 mg at 06/25/19 0806 .  calcium carbonate (TUMS - dosed in mg elemental calcium) chewable tablet 200 mg of elemental calcium, 1 tablet, Oral, TID PRN, Enzo Bi, MD .  Derrill Memo ON 06/26/2019] Chlorhexidine Gluconate Cloth  2 % PADS 6 each, 6 each, Topical, Daily, Enzo Bi, MD .  clonazePAM [facility]) tablet 2 mg, 2 mg, Oral, BID, Cristescu, Mircea G, MD, 2 mg at 06/25/19 1010 .  docusate sodium (COLACE) capsule 100 mg, 100 mg, Oral, BID PRN, Enzo Bi, MD .  enoxaparin (LOVENOX) injection 40 mg, 40 mg, Subcutaneous, Q24H, Cristescu, Linard Millers, MD, 40 mg at 06/25/19 0033 .  famotidine (PEPCID) tablet 20 mg, 20 mg, Per Tube, Daily, Cristescu, Mircea G, MD, 20 mg at 06/25/19 1026 .  fentaNYL (DURAGESIC) 25 MCG/HR 1 patch, 1 patch, Transdermal, Q72H, Cristescu,  Linard Millers, MD, 1 patch at 06/25/19 0824 .  fluticasone (FLONASE) 50 MCG/ACT nasal spray 1 spray, 1 spray, Each Nare, Daily, Cristescu, Mircea G, MD, 1 spray at 06/25/19 1012 .  guaiFENesin-dextromethorphan (ROBITUSSIN DM) 100-10 MG/5ML syrup 10 mL, 10 mL, Oral, Q6H PRN, Enzo Bi, MD .  heparin 25000-0.45 UT/250ML-% infusion, , , ,  .  insulin aspart (novoLOG) injection 0-15 Units, 0-15 Units, Subcutaneous, Q4H, Cristescu, Linard Millers, MD, 15 Units at 06/25/19 1538 .  ipratropium-albuterol (DUONEB) 0.5-2.5 (3) MG/3ML nebulizer solution 3 mL, 3 mL, Nebulization, Q6H PRN, Cristescu, Mircea G, MD, 3 mL at 06/25/19 0806 .  levofloxacin (LEVAQUIN) IVPB 750 mg, 750 mg, Intravenous, Q24H, Cristescu, Mircea G, MD .  loratadine (CLARITIN) tablet 10 mg, 10 mg, Oral, Daily, Cristescu, Mircea G, MD, 10 mg at 06/25/19 1010 .  mirtazapine (REMERON) tablet 30 mg, 30 mg, Oral, QHS, Cristescu, Mircea G, MD, 30 mg at 06/25/19 0319 .  mupirocin ointment (BACTROBAN) 2 % 1 application, 1 application, Nasal, BID, Enzo Bi, MD .  ondansetron [facility]) injection 4 mg, 4 mg, Intravenous, Q6H PRN, Enzo Bi, MD, 4 mg at 06/25/19 [id number] .  ondansetron (ZOFRAN-ODT) disintegrating tablet 4 mg, 4 mg, Oral, Q8H PRN, Enzo Bi, MD .  oxyCODONE-acetaminophen (PERCOCET/ROXICET) 5-325 MG per tablet 1 tablet, 1 tablet, Oral, Q8H PRN, Cristescu, Linard Millers, MD, 1 tablet at 06/25/19 1818 .  PARoxetine (PAXIL) tablet 20 mg, 20 mg, Per Tube, Daily, Cristescu, Mircea G, MD, 20 mg at 06/25/19 1011 .  [START ON 06/26/2019] pneumococcal 23 valent vaccine (PNEUMOVAX-23) injection 0.5 mL, 0.5 mL, Intramuscular, Tomorrow-1000, Enzo Bi, MD .  polyethylene glycol (MIRALAX / GLYCOLAX) packet 17 g, 17 g, Oral, BID PRN, Enzo Bi, MD .  QUEtiapine (SEROQUEL) tablet 25 mg, 25 mg, Per Tube, QHS, Cristescu, Linard Millers, MD, 25 mg at 06/25/19 0318 .  senna (SENOKOT) tablet 8.6-17.2 mg, 1-2 tablet, Per Tube, QHS, Cristescu, Mircea G, MD .  simvastatin (ZOCOR)  tablet 40 mg, 40 mg, Oral, QPM, Cristescu, Mircea G, MD, 40 mg at 06/25/19 1818 .  sodium chloride flush (NS) 0.9 % injection 3 mL, 3 mL, Intravenous, Q12H, Cristescu, Mircea G, MD, 3 mL at 06/25/19 1012 .  sodium chloride flush (NS) 0.9 % injection 3 mL, 3 mL, Intravenous, PRN, Cristescu, Mircea G, MD .  sodium chloride irrigation 0.9 % 50 mL, 50 mL, Per Tube, Q6H, Cristescu, Mircea G, MD, 50 mL at 06/25/19 1817    ALLERGIES   Amoxicillin    REVIEW OF SYSTEMS     10 point ROS is negative except as per subjective findings  PHYSICAL EXAMINATION   Vital Signs: Temp:  [98 F (36.7 C)-98.6 F (37 C)] 98 F (36.7 C) (01/21 1343) Pulse Rate:  [62-82] 62 (01/21 1800) Resp:  [17-23] 20 (01/21 1800) BP: (90-157)/(59-117) 129/79 (01/21 1800) SpO2:  [98 %-100 %] 100 % (01/21 1800) FiO2 (%):  [  32 %-34 %] 32 % (01/21 1547) Weight:  [72.6 kg-89.5 kg] 89.5 kg (01/21 1343)  GENERAL: Chronically ill-appearing HEAD: Normocephalic, atraumatic.  EYES: Pupils equal, round, reactive to light.  No scleral icterus.  MOUTH: Moist mucosal membrane. NECK: Supple. No thyromegaly. No nodules. No JVD.  Positive tracheostomy PULMONARY: Rhonchorous breath sounds bilaterally CARDIOVASCULAR: S1 and S2. Regular rate and rhythm. No murmurs, rubs, or gallops.  GASTROINTESTINAL: Soft, nontender, non-distended. No masses. Positive bowel sounds. No hepatosplenomegaly.  MUSCULOSKELETAL: No swelling, clubbing, or edema.  NEUROLOGIC: Mild distress due to acute illness SKIN:intact,warm,dry   PERTINENT DATA     Infusions: . sodium chloride    . 0.9 % NaCl with KCl 20 mEq / L 75 mL/hr at 06/25/19 1817  . heparin    . levofloxacin (LEVAQUIN) IV     Scheduled Medications: . aspirin  81 mg Oral Daily  . budesonide  0.25 mg Nebulization BID  . [START ON 06/26/2019] Chlorhexidine Gluconate Cloth  6 each Topical Daily  . clonazePAM  2 mg Oral BID  . enoxaparin (LOVENOX) injection  40 mg Subcutaneous Q24H  .  famotidine  20 mg Per Tube Daily  . fentaNYL  1 patch Transdermal Q72H  . fluticasone  1 spray Each Nare Daily  . insulin aspart  0-15 Units Subcutaneous Q4H  . loratadine  10 mg Oral Daily  . mirtazapine  30 mg Oral QHS  . mupirocin ointment  1 application Nasal BID  . PARoxetine  20 mg Per Tube Daily  . [START ON 06/26/2019] pneumococcal 23 valent vaccine  0.5 mL Intramuscular Tomorrow-1000  . QUEtiapine  25 mg Per Tube QHS  . senna  1-2 tablet Per Tube QHS  . simvastatin  40 mg Oral QPM  . sodium chloride flush  3 mL Intravenous Q12H  . sodium chloride irrigation  50 mL Per Tube Q6H   PRN Medications: sodium chloride, alum & mag hydroxide-simeth, calcium carbonate, docusate sodium, guaiFENesin-dextromethorphan, ipratropium-albuterol, ondansetron (ZOFRAN) IV, ondansetron, oxyCODONE-acetaminophen, polyethylene glycol, sodium chloride flush Hemodynamic parameters:   Intake/Output: 01/20 0701 - 01/21 0700 In: 500 [IV [id number] Out: -   Ventilator  Settings: Vent Mode: AC FiO2 (%):  [32 %-34 %] 32 % Set Rate:  [20 bmp] 20 bmp Vt Set:  [450 mL] 450 mL PEEP:  [5 cmH20] 5 cmH20    LAB RESULTS:  Basic Metabolic Panel: Recent Labs  Lab 06/24/19 2057 06/25/19 0328  NA 133* 137  K 3.0* 2.9*  CL 97* 101  CO2 23 24  GLUCOSE 563* 317*  BUN 7 7  CREATININE 1.01* 0.75  CALCIUM 8.8* 8.4*  MG  --  2.1   Liver Function Tests: Recent Labs  Lab 06/25/19 0328  AST 41  ALT 23  ALKPHOS 121  BILITOT 0.7  PROT 7.5  ALBUMIN 3.6   No results for input(s): LIPASE, AMYLASE in the last 168 hours. No results for input(s): AMMONIA in the last 168 hours. CBC: Recent Labs  Lab 06/24/19 2057 06/25/19 0328  WBC 4.8 5.8  NEUTROABS 2.9  --   HGB 13.1 12.3  HCT 42.1 38.5  MCV 81.0 80.4  PLT 213 229   Cardiac Enzymes: No results for input(s): CKTOTAL, CKMB, CKMBINDEX, TROPONINI in the last 168 hours. BNP: Invalid input(s): POCBNP CBG: Recent Labs  Lab 06/25/19 0527  06/25/19 0745 06/25/19 1231 06/25/19 1336 06/25/19 1519  GLUCAP 312* 238* 318* 288* 408*     IMAGING RESULTS:  Imaging: DG Chest Port 1 View  Result Date:  06/24/2019 CLINICAL DATA:  Dyspnea, productive cough with yellow sputum, altered mental status; past history of CHF, COPD, coronary artery disease, hepatitis-C, hypertension EXAM: PORTABLE CHEST 1 VIEW COMPARISON:  Portable exam 1939 hours compared to 03/14/2019 FINDINGS: Tracheostomy tube stable. Normal heart size, mediastinal contours, and pulmonary vascularity. Emphysematous changes especially RIGHT upper lobe. Minimal LEFT pleural effusion and lingular subsegmental atelectasis. Remaining lungs clear. No infiltrate or pneumothorax. Osseous structures unremarkable. IMPRESSION: COPD changes with minimal effusion and atelectasis at LEFT base. Electronically Signed   By: Lavonia Dana M.D.   On: 06/24/2019 20:24      ASSESSMENT AND PLAN    -Multidisciplinary rounds held today  Acute Hypoxic Respiratory Failure  -Due to acute COPD exacerbation as well as bilateral atelectasis worse at the bases -continue Bronchodilator Therapy -Chest physiotherapy with [employer] -Goal SPO2 over 88% -Considering prednisone, currently on Pulmicort-patient is improving  ID -continue IV abx as prescibed -follow up cultures  GI/Nutrition GI PROPHYLAXIS as indicated DIET-->TF's as tolerated Constipation protocol as indicated  ENDO - ICU hypoglycemic\Hyperglycemia protocol -check FSBS per protocol   ELECTROLYTES -follow labs as needed -replace as needed -pharmacy consultation   DVT/GI PRX ordered -SCDs  TRANSFUSIONS AS NEEDED MONITOR FSBS ASSESS the need for LABS as needed   Critical care provider statement:    Critical care time (minutes):  33   Critical care time was exclusive of:  Separately billable procedures and treating other patients   Critical care was necessary to treat or prevent imminent or life-threatening deterioration  of the following conditions:   Acute on chronic hypoxemic respiratory failure, acute COPD exacerbation, atelectasis bilaterally, multiple comorbid conditions   Critical care was time spent personally by me on the following activities:  Development of treatment plan with patient or surrogate, discussions with consultants, evaluation of patient's response to treatment, examination of patient, obtaining history from patient or surrogate, ordering and performing treatments and interventions, ordering and review of laboratory studies and re-evaluation of patient's condition.  I assumed direction of critical care for this patient from another provider in my specialty: no    This document was prepared using Dragon voice recognition software and may include unintentional dictation errors.    Ottie Glazier, M.D.  Division of [place]

## 2019-06-25 NOTE — Progress Notes (Signed)
Pt arrived on unit at this time. Alert and oriented X4. Pt on home vent. RT at bedside. VSS.

## 2019-06-26 LAB — CBC
HCT: 38.8 % (ref 36.0–46.0)
Hemoglobin: 12.2 g/dL (ref 12.0–15.0)
MCH: 25.9 pg — ABNORMAL LOW (ref 26.0–34.0)
MCHC: 31.4 g/dL (ref 30.0–36.0)
MCV: 82.4 fL (ref 80.0–100.0)
Platelets: 189 10*3/uL (ref 150–400)
RBC: 4.71 MIL/uL (ref 3.87–5.11)
RDW: 15.6 % — ABNORMAL HIGH (ref 11.5–15.5)
WBC: 5.9 10*3/uL (ref 4.0–10.5)
nRBC: 0 % (ref 0.0–0.2)

## 2019-06-26 LAB — GLUCOSE, CAPILLARY
Glucose-Capillary: 254 mg/dL — ABNORMAL HIGH (ref 70–99)
Glucose-Capillary: 301 mg/dL — ABNORMAL HIGH (ref 70–99)
Glucose-Capillary: 308 mg/dL — ABNORMAL HIGH (ref 70–99)
Glucose-Capillary: 316 mg/dL — ABNORMAL HIGH (ref 70–99)
Glucose-Capillary: 341 mg/dL — ABNORMAL HIGH (ref 70–99)
Glucose-Capillary: 354 mg/dL — ABNORMAL HIGH (ref 70–99)

## 2019-06-26 LAB — BASIC METABOLIC PANEL
Anion gap: 8 (ref 5–15)
BUN: 8 mg/dL (ref 6–20)
CO2: 20 mmol/L — ABNORMAL LOW (ref 22–32)
Calcium: 8.4 mg/dL — ABNORMAL LOW (ref 8.9–10.3)
Chloride: 110 mmol/L (ref 98–111)
Creatinine, Ser: 0.87 mg/dL (ref 0.44–1.00)
GFR calc Af Amer: >60 mL/min (ref >60)
GFR calc non Af Amer: >60 mL/min (ref >60)
Glucose, Bld: 283 mg/dL — ABNORMAL HIGH (ref 70–99)
Potassium: 3.4 mmol/L — ABNORMAL LOW (ref 3.5–5.1)
Sodium: 138 mmol/L (ref 135–145)

## 2019-06-26 LAB — MAGNESIUM: Magnesium: 1.8 mg/dL (ref 1.7–2.4)

## 2019-06-26 LAB — PROCALCITONIN: Procalcitonin: <0.1 ng/mL

## 2019-06-26 MED ORDER — METHYLPREDNISOLONE SODIUM SUCC 40 MG IJ SOLR
40.0000 mg | INTRAMUSCULAR | Status: DC
  Administered 2019-06-26: 07:00:00 40 mg via INTRAVENOUS
  Filled 2019-06-26: qty 1

## 2019-06-26 MED ORDER — ENSURE MAX PROTEIN PO LIQD
11.0000 [oz_av] | Freq: Two times a day (BID) | ORAL | Status: DC
  Administered 2019-06-26 – 2019-06-28 (×3): 11 [oz_av] via ORAL
  Filled 2019-06-26: qty 330

## 2019-06-26 MED ORDER — MORPHINE SULFATE (PF) 2 MG/ML IV SOLN
INTRAVENOUS | Status: AC
  Administered 2019-06-26: 2 mg via INTRAVENOUS
  Filled 2019-06-26: qty 1

## 2019-06-26 MED ORDER — MORPHINE SULFATE (PF) 2 MG/ML IV SOLN
2.0000 mg | Freq: Once | INTRAVENOUS | Status: AC
  Administered 2019-06-26: 19:00:00 2 mg via INTRAVENOUS
  Filled 2019-06-26: qty 1

## 2019-06-26 MED ORDER — MORPHINE SULFATE (PF) 2 MG/ML IV SOLN: 2.0000 mg | Freq: Once | INTRAVENOUS | Status: DC

## 2019-06-26 MED ORDER — DEXMEDETOMIDINE HCL IN NACL 400 MCG/100ML IV SOLN
0.4000 ug/kg/h | INTRAVENOUS | Status: DC
  Administered 2019-06-26: 06:00:00 0.4 ug/kg/h via INTRAVENOUS
  Filled 2019-06-26: qty 100

## 2019-06-26 MED ORDER — LIVING WELL WITH DIABETES BOOK
Freq: Once | Status: DC
  Filled 2019-06-26: qty 1

## 2019-06-26 MED ORDER — PREDNISONE 20 MG PO TABS
40.0000 mg | ORAL_TABLET | Freq: Every day | ORAL | Status: DC
  Administered 2019-06-27: 10:00:00 40 mg via ORAL
  Filled 2019-06-26: qty 2

## 2019-06-26 NOTE — Progress Notes (Signed)
Nursing reports that is pt states is short of breath and "can't breathe" and extremely anxious on trach collar.  Nursing reports that vital signs and SpO2 sats are stable.  Will place pt back on her home vent for support along with low dose precedex.  Pt noted to have expiratory wheezing, will place on IV steroids.     Darel Hong, AGACNP-BC [facility] Pager: [phone]

## 2019-06-26 NOTE — Progress Notes (Signed)
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 female with medical history significant of COPD, CHF, hyperlipidemia, coronary artery disease, hypertension, hypercholesterolemia, status post tracheostomy, came with a chief complaint of mild shortness of breath with yellow productive sputum.  She is on 4 L of oxygen all the time.   Acute on chronic respiratory failure with hypoxemia Secondary to pneumonia --baseline 4L O2 PLAN: F/u sputum culture via tracheostomy DuoNebs, Pulmicort Titrate oxygen keep saturation more than 88% Continue with antibiotics IV Levaquin  COPD exacerbation --Pt presented with increased cough, sputum production, and wheezing --Started IV solumedrol today --continue IV Levaquin --continue bronchodilators  Hyperglycemia New onset diabetes mellitus type 2 Plan insulin sliding scale per sensitivity factor Hemoglobin A1c 11.8 --Lantus 20u nightly --SSI q4h while on tube feeds  Hypokalemia Replete potassium   Chronic pain medication Continue fentanyl and oxycodone  Hypercholesterolemia Zocor  Coronary artery disease Aspirin Zocor  Status post PEG tube Flushes normal saline every 6 hours  Dysphagia Per family patient can have soft diet  Anxiety and depression --continue home Remeron, paxil, seroquel   FEN: tube feed and soft diet DVT prophylaxis: Lovenox SQ Code Status: DNR  Family Communication: not today Disposition Plan: back to SNF   Subjective and Interval History:    Continue to have pain around the PEG tube site, sputum production.  No fever, chest pain, N/V/D, dysuria.  Due to observed wheezing, critical care team started IV solumedrol.   Objective: Vitals:   06/26/19 1500 06/26/19 1600 06/26/19 1700 06/26/19 1800  BP: 127/74 (!) 144/92 129/86 (!) 146/109  Pulse: (!) 57 (!) 48 61 (!) 109  Resp: 20 20 (!) 21 20  Temp: 98.6 F (37 C)     TempSrc: Oral     SpO2: 99% 100% 100% 97%  Weight:      Height:        Intake/Output Summary (Last 24 hours) at 06/26/2019 1941 Last data filed at 06/26/2019 1800 Gross per 24 hour  Intake 2044.58 ml  Output 1700 ml  Net 344.58 ml   Filed Weights   06/24/19 1936 06/25/19 1343  Weight: 72.6 kg 89.5 kg    Examination:   Constitutional: NAD, AAOx3, lethargic HEENT: conjunctivae and lids normal, EOMI CV: RRR no M,R,G. Distal pulses +2.  No cyanosis.   RESP: Diffuse Rhonchi, trach collar on connected to suppl O2 GI: +BS, distended, tenderness around the PEG tube side Extremities: No effusions, edema, or tenderness in BLE SKIN: warm, dry and intact Neuro: II - XII grossly intact.  Sensation intact Psych: Depressed mood and affect.     Data Reviewed: I have personally reviewed following labs and imaging studies  CBC: Recent Labs  Lab 06/24/19 2057 06/25/19 0328 06/26/19 0733  WBC 4.8 5.8 5.9  NEUTROABS 2.9  --   --   HGB 13.1 12.3 12.2  HCT 42.1 38.5 38.8  MCV 81.0 80.4 82.4  PLT 213 229 [id number]   Basic Metabolic Panel: Recent Labs  Lab 06/24/19 2057 06/25/19 0328 06/26/19 [id number]  NA 133* 137 138  K 3.0* 2.9* 3.4*  CL 97* 101 110  CO2 23 24 20*  GLUCOSE 563* 317* 283*  BUN 7 7 8   CREATININE 1.01* 0.75 0.87  CALCIUM 8.8* 8.4* 8.4*  MG  --  2.1 1.8   GFR: Estimated Creatinine Clearance: 70.2 mL/min (by C-G formula based on SCr of 0.87 mg/dL). Liver Function Tests: Recent Labs  Lab 06/25/19 0328  AST 41  ALT 23  ALKPHOS 121  BILITOT 0.7  PROT 7.5  ALBUMIN 3.6   No results  for input(s): LIPASE, AMYLASE in the last 168 hours. No results for input(s): AMMONIA in the last 168 hours. Coagulation Profile: No results for input(s): INR, PROTIME in the last 168 hours. Cardiac Enzymes: No results for input(s): CKTOTAL, CKMB, CKMBINDEX, TROPONINI in the last 168 hours. BNP (last 3 results) No results for input(s): PROBNP in the last 8760 hours. HbA1C: Recent Labs    06/25/19 0328  HGBA1C 11.8*   CBG: Recent Labs  Lab 06/26/19 0021 06/26/19 0348 06/26/19 0733 06/26/19 1120 06/26/19 1557  GLUCAP 308* 316* 254* 301* 354*   Lipid Profile: No results for input(s): CHOL, HDL, LDLCALC, TRIG, CHOLHDL, LDLDIRECT in the last 72 hours. Thyroid Function Tests: No results for input(s): TSH, T4TOTAL, FREET4, T3FREE, THYROIDAB in the last 72 hours. Anemia Panel: No results for input(s): VITAMINB12, FOLATE, FERRITIN, TIBC, IRON, RETICCTPCT in the last 72 hours. Sepsis Labs: Recent Labs  Lab 06/26/19 0733  PROCALCITON <0.10    Recent Results (from the past 240 hour(s))  Culture, respiratory     Status: None (Preliminary result)   Collection Time: 06/24/19  7:44 PM   Specimen: Tracheal Aspirate  Result Value Ref Range Status   Specimen Description   Final    TRACHEAL ASPIRATE Performed at [facility], [address], [place], [place] [address]    Special Requests   Final    NONE Performed at [place], [place] [address]    Gram Stain   Final    RARE WBC PRESENT,BOTH PMN AND MONONUCLEAR ABUNDANT GRAM NEGATIVE RODS RARE GRAM POSITIVE RODS RARE GRAM POSITIVE COCCI IN PAIRS RARE BUDDING YEAST SEEN    Culture   Final    ABUNDANT PSEUDOMONAS AERUGINOSA SUSCEPTIBILITIES TO FOLLOW Performed at [facility], [place] [address], [place], [place] [address]    Report Status PENDING  Incomplete  SARS CORONAVIRUS 2 (TAT 6-24 HRS) Nasopharyngeal Nasopharyngeal Swab     Status: None   Collection Time: 06/24/19   7:45 PM   Specimen: Nasopharyngeal Swab  Result Value Ref Range Status   SARS Coronavirus 2 NEGATIVE NEGATIVE Final    Comment: (NOTE) SARS-CoV-2 target nucleic acids are NOT DETECTED. The SARS-CoV-2 RNA is generally detectable in upper and lower respiratory specimens during the acute phase of infection. Negative results do not preclude SARS-CoV-2 infection, do not rule out co-infections with other pathogens, and should not be used as the sole basis for treatment or other patient management decisions. Negative results must be combined with clinical observations, patient history, and epidemiological information. The expected result is Negative. Fact Sheet for Patients: SugarRoll.be Fact Sheet for Healthcare Providers: https://www.woods-mathews.com/ This test is not yet approved or cleared by the [place] FDA and  has been authorized for detection and/or diagnosis of SARS-CoV-2 by FDA under an Emergency Use Authorization (EUA). This EUA will remain  in effect (meaning this test can be used) for the duration of the COVID-19  declaration under Section 56 4(b)(1) of the Act, 21 U.S.C. section 360bbb-3(b)(1), unless the authorization is terminated or revoked sooner. Performed at [facility], [place] [address] [place],  [address]   MRSA PCR Screening     Status: Abnormal   Collection Time: 06/25/19  1:40 PM   Specimen: Nasal Mucosa; Nasopharyngeal  Result Value Ref Range Status   MRSA by PCR POSITIVE (A) NEGATIVE Final    Comment:        The GeneXpert MRSA Assay (FDA approved for NASAL specimens only), is one component of a comprehensive MRSA colonization surveillance program. It is not intended to diagnose MRSA infection nor to guide or monitor treatment for MRSA infections. RESULT CALLED TO, READ BACK BY AND VERIFIED WITH: Adalberto Ill 06/25/19 at 1516. CST Performed at [facility], [place]  [place],  [address]       Radiology Studies: DG Chest [place] 1 View  Result Date: 06/24/2019 CLINICAL DATA:  Dyspnea, productive cough with yellow sputum, altered mental status; past history of CHF, COPD, coronary artery disease, hepatitis-C, hypertension EXAM: PORTABLE CHEST 1 VIEW COMPARISON:  Portable exam 1939 hours compared to 03/14/2019 FINDINGS: Tracheostomy tube stable. Normal heart size, mediastinal contours, and pulmonary vascularity. Emphysematous changes especially RIGHT upper lobe. Minimal LEFT pleural effusion and lingular subsegmental atelectasis. Remaining lungs clear. No infiltrate or pneumothorax. Osseous structures unremarkable. IMPRESSION: COPD changes with minimal effusion and atelectasis at LEFT base. Electronically Signed   By: Lavonia Dana M.D.   On: 06/24/2019 20:24     Scheduled Meds: . aspirin  81 mg Oral Daily  . budesonide  0.25 mg Nebulization BID  . Chlorhexidine Gluconate Cloth  6 each Topical Daily  . clonazePAM  2 mg Oral BID  . enoxaparin (LOVENOX) injection  40 mg Subcutaneous Q24H  . famotidine  20 mg Per Tube Daily  . fentaNYL  1 patch Transdermal Q72H  . fluticasone  1 spray Each Nare Daily  . insulin aspart  0-15 Units Subcutaneous Q4H  . insulin glargine  20 Units Subcutaneous QHS  . living well with diabetes book   Does not apply Once  . loratadine  10 mg Oral Daily  . mirtazapine  30 mg Oral QHS  . mupirocin ointment  1 application Nasal BID  . PARoxetine  20 mg Per Tube Daily  . pneumococcal 23 valent vaccine  0.5 mL Intramuscular Tomorrow-1000  . [START ON 06/27/2019] predniSONE  40 mg Oral Q breakfast  . Ensure Max Protein  11 oz Oral BID  . QUEtiapine  25 mg Per Tube QHS  . senna  1-2 tablet Per Tube QHS  . simvastatin  40 mg Oral QPM  . sodium chloride flush  3 mL Intravenous Q12H  . sodium chloride irrigation  50 mL Per Tube Q6H   Continuous Infusions: . sodium chloride    . 0.9 % NaCl with KCl 20 mEq / L 75 mL/hr at 06/26/19 1727   . levofloxacin (LEVAQUIN) IV 750 mg (06/25/19 2300)     LOS: 2 days     Enzo Bi, MD [facility] If 7PM-7AM, please contact night-coverage 06/26/2019, 7:41 PM

## 2019-06-26 NOTE — Progress Notes (Signed)
Initial Nutrition Assessment  DOCUMENTATION CODES:   Obesity unspecified  INTERVENTION:   Ensure Max protein supplement BID, each supplement provides 150kcal and 30g of protein.  NUTRITION DIAGNOSIS:   Increased nutrient needs related to chronic illness(COPD) as evidenced by increased estimated needs.  GOAL:   Patient will meet greater than or equal to 90% of their needs  MONITOR:   PO intake, Supplement acceptance, Labs, Weight trends, Skin, I & O's  REASON FOR ASSESSMENT:   Ventilator    ASSESSMENT:   59 year old female with PMHx of emphysema, COPD, HTN, CAD, hx cocaine abuse, asthma, CHF, anxiety, hepatitis C, hx tracheostomy tube placement 10/17/2017 who uses ventilator at home, hx G-tube placement 10/31/2017 admitted with acute COPD exacerbation and PNA. Pt also with new DM diagnosis   RD working remotely.  Pt is well known to nutrition department and this RD from multiple previous admits. Pt is able to eat soft foods now; pt only uses her G-tube for meds. Spoke with RN, pt ate well at breakfast and lunch today. RD will add supplements to help pt meet her estimated needs. Pt getting free water flushes q6 via tube.    Patient has gained a significant amount of weight. She was 69.4kg on 09/17/2018, 74.4kg on 12/08/2018, 85.9kg on 03/14/2019 and now she is 89.5kg.   Medications reviewed and include: aspirin, lovenox, insulin, solu-medrol, remeron, paxil, senokot, NaCl w/ KCl @75ml /hr, precedex  Labs reviewed: K 3.4(L) cbgs- 408, 323, 308, 316, 254 x 24 hrs AIC 11.8(H)- 1/21  Unable to complete Nutrition-Focused physical exam at this time.   Diet Order:   Diet Order            Diet Carb Modified Fluid consistency: Thin; Room service appropriate? Yes  Diet effective now             EDUCATION NEEDS:   No education needs have been identified at this time  Skin:  Skin Assessment: Reviewed RN Assessment  Last BM:  pta  Height:   Ht Readings from Last 1  Encounters:  06/25/19 5' (1.524 m)    Weight:   Wt Readings from Last 1 Encounters:  06/25/19 89.5 kg    Ideal Body Weight:  45.4 kg  BMI:  Body mass index is 38.53 kg/m.  Estimated Nutritional Needs:   Kcal:  1600-1800kcal/day  Protein:  90-100g/day  Fluid:  1.4-1.6L/day  Koleen Distance MS, RD, LDN Pager #- [phone] Office#- [phone] After Hours Pager: [phone]

## 2019-06-26 NOTE — Progress Notes (Addendum)
Inpatient Diabetes Program Recommendations  AACE/ADA: New Consensus Statement on Inpatient Glycemic Control (2015)  Target Ranges:  Prepandial:   less than 140 mg/dL      Peak postprandial:   less than 180 mg/dL (1-2 hours)      Critically ill patients:  140 - 180 mg/dL   Results for AYRABELLA, LABOMBARD (MRN [id number]) as of 06/26/2019 11:01  Ref. Range 06/25/2019 05:27 06/25/2019 07:45 06/25/2019 12:31 06/25/2019 13:36 06/25/2019 15:19 06/25/2019 19:38  Glucose-Capillary Latest Ref Range: 70 - 99 mg/dL 312 (H)  11 units NOVOLOG  238 (H)  5 units NOVOLOG  318 (H)    288 (H)  8 units NOVOLOG 408 (H)  15 units NOVOLOG  323 (H)  11 units NOVOLOG    Results for BERTA, DENSON (MRN [id number]) as of 06/26/2019 11:01  Ref. Range 06/26/2019 00:21 06/26/2019 03:48 06/26/2019 07:33  Glucose-Capillary Latest Ref Range: 70 - 99 mg/dL 308 (H)  11 units NOVOLOG +  20 units LANTUS  316 (H)  11 units NOVOLOG  254 (H)  8 units NOVOLOG    Results for FLOYD, LUSIGNAN (MRN [id number]) as of 06/26/2019 11:01  Ref. Range 03/09/2019 06:26 06/25/2019 03:28  Hemoglobin A1C Latest Ref Range: 4.8 - 5.6 % 6.7 (H) 11.8 (H)  (291 mg/dl)    Admit with: Acute on chronic respiratory failure with hypoxemia Secondary to pneumonia/ New onset diabetes mellitus type 2  History: COPD, CHF, Trach with Chronic O2 at home    Current Orders: Lantus 20 units QHS      Novolog Moderate Correction Scale/ SSI (0-15 units) Q4 hours     MD- Note Lantus started last PM at 12am.  New diagnosis of diabetes per MD notes.  Likely will need insulin for home given A1c is 11.8%.  Plan to have conversation with pt about new diagnosis and possible need for insulin for home.  Will ask RNs caring for pt to allow pt to begin giving all insulin injections for practice.    Addendum 3:20pm- Met w/ pt at bedside today.  Explained to pt why we are checking her CBGs and giving her insulin.  Asked pt if the MDs had addressed her  glucose levels with her and she shook her head "No".  Discussed w/ pt that we will continue to check her CBGs and give her insulin if her CBGs remain elevated.  Briefly discussed what diabetes is and that she may need medicine for home to help control her CBGs.  Pt gave me permission to call her daughter [name] who helps take care of pt at home.  Spoke with Ms. Kathee Polite (pt's daughter) by phone.  Relayed all of the above info to pt's daughter and explained what diabetes is, importance of good glucose control at home, reviewed A1c results from October and current A1c, explained what an A1c is and what it reflects, and also discussed w/ daughter the possibility that pt may need insulin injections at home to help control glucose levels.  Encouraged pt's daughter to look over diabetes info on the American Diabetes Association website and asked daughter if she would be willing to come to hospital to learn how to give insulin in case the decision is made to send pt home on insulin.  Pt's daughter stated she has never given insulin but would be willing to learn to help her mother (the patient).  Will ask RNs caring for pt to show pt's daughter how to give insulin when she comes to  visit.  Also told pt's daughter I will be ordering a diabetes educational booklet that she can read with her Mom for more information.     --Will follow patient during hospitalization--  Wyn Quaker RN, MSN, CDE Diabetes Coordinator Inpatient Glycemic Control Team Team Pager: [phone] (8a-5p)

## 2019-06-26 NOTE — Progress Notes (Signed)
Pt is now resting comfortably, awake and alert, states her breathing has improved.  Continue to monitor.   Darel Hong, AGACNP-BC [facility] Pager: [phone]

## 2019-06-27 ENCOUNTER — Inpatient Hospital Stay: Payer: Medicaid Other

## 2019-06-27 LAB — CULTURE, RESPIRATORY W GRAM STAIN

## 2019-06-27 LAB — BASIC METABOLIC PANEL
Anion gap: 8 (ref 5–15)
BUN: 7 mg/dL (ref 6–20)
CO2: 21 mmol/L — ABNORMAL LOW (ref 22–32)
Calcium: 8.6 mg/dL — ABNORMAL LOW (ref 8.9–10.3)
Chloride: 115 mmol/L — ABNORMAL HIGH (ref 98–111)
Creatinine, Ser: 0.93 mg/dL (ref 0.44–1.00)
GFR calc Af Amer: >60 mL/min (ref >60)
GFR calc non Af Amer: >60 mL/min (ref >60)
Glucose, Bld: 273 mg/dL — ABNORMAL HIGH (ref 70–99)
Potassium: 4.7 mmol/L (ref 3.5–5.1)
Sodium: 144 mmol/L (ref 135–145)

## 2019-06-27 LAB — CBC
HCT: 41.9 % (ref 36.0–46.0)
Hemoglobin: 12.8 g/dL (ref 12.0–15.0)
MCH: 25.7 pg — ABNORMAL LOW (ref 26.0–34.0)
MCHC: 30.5 g/dL (ref 30.0–36.0)
MCV: 84.1 fL (ref 80.0–100.0)
Platelets: 195 10*3/uL (ref 150–400)
RBC: 4.98 MIL/uL (ref 3.87–5.11)
RDW: 15.8 % — ABNORMAL HIGH (ref 11.5–15.5)
WBC: 8.8 10*3/uL (ref 4.0–10.5)
nRBC: 0 % (ref 0.0–0.2)

## 2019-06-27 LAB — GLUCOSE, CAPILLARY
Glucose-Capillary: 221 mg/dL — ABNORMAL HIGH (ref 70–99)
Glucose-Capillary: 274 mg/dL — ABNORMAL HIGH (ref 70–99)
Glucose-Capillary: 292 mg/dL — ABNORMAL HIGH (ref 70–99)
Glucose-Capillary: 298 mg/dL — ABNORMAL HIGH (ref 70–99)
Glucose-Capillary: 298 mg/dL — ABNORMAL HIGH (ref 70–99)
Glucose-Capillary: 327 mg/dL — ABNORMAL HIGH (ref 70–99)
Glucose-Capillary: 336 mg/dL — ABNORMAL HIGH (ref 70–99)

## 2019-06-27 LAB — LACTIC ACID, PLASMA
Lactic Acid, Venous: 2.4 mmol/L (ref 0.5–1.9)
Lactic Acid, Venous: 2.3 mmol/L (ref 0.5–1.9)

## 2019-06-27 LAB — MAGNESIUM: Magnesium: 1.8 mg/dL (ref 1.7–2.4)

## 2019-06-27 MED ORDER — HYDRALAZINE HCL 20 MG/ML IJ SOLN
10.0000 mg | INTRAMUSCULAR | Status: DC | PRN
  Administered 2019-06-27 – 2019-07-03 (×5): 20 mg via INTRAVENOUS
  Filled 2019-06-27 (×5): qty 1

## 2019-06-27 MED ORDER — CLONAZEPAM 0.5 MG PO TABS
0.5000 mg | ORAL_TABLET | Freq: Once | ORAL | Status: AC
  Administered 2019-06-27: 04:00:00 0.5 mg via ORAL
  Filled 2019-06-27: qty 1

## 2019-06-27 MED ORDER — INSULIN ASPART 100 UNIT/ML ~~LOC~~ SOLN
0.0000 [IU] | SUBCUTANEOUS | Status: DC
  Administered 2019-06-27 (×2): 8 [IU] via SUBCUTANEOUS
  Administered 2019-06-27: 11 [IU] via SUBCUTANEOUS
  Administered 2019-06-28: 15 [IU] via SUBCUTANEOUS
  Administered 2019-06-28 (×4): 11 [IU] via SUBCUTANEOUS
  Administered 2019-06-28: 8 [IU] via SUBCUTANEOUS
  Administered 2019-06-29: 5 [IU] via SUBCUTANEOUS
  Administered 2019-06-29: 11 [IU] via SUBCUTANEOUS
  Administered 2019-06-29: 8 [IU] via SUBCUTANEOUS
  Administered 2019-06-29: 20:00:00 11 [IU] via SUBCUTANEOUS
  Administered 2019-06-29: 8 [IU] via SUBCUTANEOUS
  Administered 2019-06-30 (×3): 5 [IU] via SUBCUTANEOUS
  Filled 2019-06-27 (×17): qty 1

## 2019-06-27 MED ORDER — INSULIN GLARGINE 100 UNIT/ML ~~LOC~~ SOLN
30.0000 [IU] | Freq: Every day | SUBCUTANEOUS | Status: DC
  Filled 2019-06-27: qty 0.3

## 2019-06-27 MED ORDER — DEXMEDETOMIDINE HCL IN NACL 400 MCG/100ML IV SOLN
0.4000 ug/kg/h | INTRAVENOUS | Status: DC
  Administered 2019-06-27: 06:00:00 0.4 ug/kg/h via INTRAVENOUS
  Filled 2019-06-27: qty 100

## 2019-06-27 MED ORDER — INSULIN GLARGINE 100 UNIT/ML ~~LOC~~ SOLN
35.0000 [IU] | Freq: Every day | SUBCUTANEOUS | Status: DC
  Administered 2019-06-27: 35 [IU] via SUBCUTANEOUS
  Filled 2019-06-27 (×2): qty 0.35

## 2019-06-27 MED ORDER — LACTATED RINGERS IV BOLUS: 1000.0000 mL | Freq: Once | INTRAVENOUS | Status: DC

## 2019-06-27 MED ORDER — LORAZEPAM 2 MG/ML IJ SOLN
2.0000 mg | Freq: Once | INTRAMUSCULAR | Status: AC
  Administered 2019-06-27: 18:00:00 2 mg via INTRAVENOUS
  Filled 2019-06-27: qty 1

## 2019-06-27 MED ORDER — CLONIDINE HCL 0.1 MG/24HR TD PTWK
0.1000 mg | MEDICATED_PATCH | TRANSDERMAL | Status: DC
  Administered 2019-06-27 – 2019-07-04 (×2): 0.1 mg via TRANSDERMAL
  Filled 2019-06-27 (×2): qty 1

## 2019-06-27 MED ORDER — FENTANYL CITRATE (PF) 100 MCG/2ML IJ SOLN
25.0000 ug | Freq: Once | INTRAMUSCULAR | Status: AC
  Administered 2019-06-27: 20:00:00 25 ug via INTRAVENOUS
  Filled 2019-06-27: qty 2

## 2019-06-27 MED ORDER — CLONIDINE HCL ER 0.1 MG PO TB12
0.1000 mg | ORAL_TABLET | Freq: Three times a day (TID) | ORAL | Status: DC | PRN
  Administered 2019-06-27: 17:00:00 0.1 mg via ORAL
  Filled 2019-06-27 (×2): qty 1

## 2019-06-27 MED ORDER — DEXMEDETOMIDINE HCL IN NACL 400 MCG/100ML IV SOLN
0.4000 ug/kg/h | INTRAVENOUS | Status: DC
  Administered 2019-06-27: 0.4 ug/kg/h via INTRAVENOUS
  Administered 2019-06-28: 0.9 ug/kg/h via INTRAVENOUS
  Administered 2019-06-28 (×2): 1.2 ug/kg/h via INTRAVENOUS
  Administered 2019-06-29: 05:00:00 1 ug/kg/h via INTRAVENOUS
  Filled 2019-06-27 (×5): qty 100

## 2019-06-27 MED ORDER — MIRTAZAPINE 15 MG PO TBDP
30.0000 mg | ORAL_TABLET | Freq: Every day | ORAL | Status: DC
  Administered 2019-06-27: 23:00:00 30 mg via ORAL
  Administered 2019-06-27: 17:00:00 15 mg via ORAL
  Administered 2019-06-28 – 2019-07-04 (×7): 30 mg via ORAL
  Filled 2019-06-27 (×10): qty 2

## 2019-06-27 MED ORDER — LORAZEPAM 2 MG/ML IJ SOLN
1.0000 mg | INTRAMUSCULAR | Status: DC | PRN
  Administered 2019-06-27 – 2019-06-29 (×7): 1 mg via INTRAVENOUS
  Filled 2019-06-27 (×7): qty 1

## 2019-06-27 MED ORDER — TRAZODONE HCL 50 MG PO TABS
50.0000 mg | ORAL_TABLET | Freq: Once | ORAL | Status: AC
  Administered 2019-06-27: 50 mg via ORAL
  Filled 2019-06-27: qty 1

## 2019-06-27 MED ORDER — QUETIAPINE FUMARATE 25 MG PO TABS
25.0000 mg | ORAL_TABLET | Freq: Once | ORAL | Status: AC
  Administered 2019-06-27: 23:00:00 25 mg

## 2019-06-27 MED ORDER — LORAZEPAM 2 MG/ML IJ SOLN
INTRAMUSCULAR | Status: AC
  Administered 2019-06-27: 16:00:00 1 mg
  Filled 2019-06-27: qty 1

## 2019-06-27 MED ORDER — PREDNISONE 20 MG PO TABS
30.0000 mg | ORAL_TABLET | Freq: Every day | ORAL | Status: AC
  Administered 2019-06-28 – 2019-07-01 (×4): 30 mg via ORAL
  Administered 2019-07-02: 20 mg via ORAL
  Administered 2019-07-03 – 2019-07-04 (×2): 30 mg via ORAL
  Filled 2019-06-27 (×7): qty 1

## 2019-06-27 NOTE — Progress Notes (Signed)
Late note 1800- After several prns and discussions with Dr. Lanney Gins patient placed back on hospital ventilator. Patient still anxious and upset. Patient oxygen saturations remain 98-100 on 28% FIO2. Respiratory rate remains in the 20s. Heart rate is still ST.

## 2019-06-27 NOTE — Plan of Care (Signed)
Nutrition Education Note RD consulted for nutrition education regarding diabetes.   Lab Results  Component Value Date   HGBA1C 11.8 (H) 06/25/2019   RD working remotely today, education provided to patient's daughter via phone. Verbal consent obtained from patient to discuss nutrition history and provide nutrition education to [place] (daughter). Education handout has been attached to patient's discharge instructions per request of patient's daughter.   RD provided "Carbohydrate Counting for People with Diabetes" handout from the Academy of Nutrition and Dietetics. Discussed different food groups and their effects on blood sugar, emphasizing carbohydrate-containing foods. Provided list of carbohydrates and recommended serving sizes of common foods.  Discussed importance of controlled and consistent carbohydrate intake throughout the day. Provided examples of ways to balance meals/snacks and encouraged intake of high-fiber, whole grain complex carbohydrates. Teach back method used.  Expect good compliance.  Body mass index is 38.53 kg/m. Pt meets criteria for obese based on current BMI.  Current diet order is CM, patient is consuming approximately 75% of meals at this time. Labs and medications reviewed. No further nutrition interventions warranted at this time. RD contact information provided. If additional nutrition issues arise, please re-consult RD.  Michelle [name], RD, LDN Clinical Nutrition [facility] Telephone [phone] After Hours/Weekend Pager: [phone]

## 2019-06-27 NOTE — Progress Notes (Signed)
PROGRESS NOTE    Michelle [name]  [id number] DOB: November 23, 1960 DOA: 06/24/2019 PCP: Frazier Richards, MD    Assessment & Plan:   Principal Problem:   COPD exacerbation [facility]) Active Problems:   Chronic respiratory failure requiring continuous mechanical ventilation through tracheostomy ([place])   CAD (coronary artery disease)   HTN (hypertension)   Chronic diastolic CHF (congestive heart failure) ([place])   New onset of type 2 diabetes mellitus in pediatric patient ([place])    Michelle [name] is a 59 y.o. female with medical history significant of COPD, CHF, hyperlipidemia, coronary artery disease, hypertension, hypercholesterolemia, status post tracheostomy, came with a chief complaint of mild shortness of breath with yellow productive sputum.  She is on 4 L of oxygen all the time.   # Acute on chronic respiratory failure with hypoxemia 2/2 # COPD exacerbation and bilateral atelectasis  --baseline 4L O2 --Pt presented with increased cough, sputum production, and wheezing PLAN: --DuoNebs, Pulmicort -Chest physiotherapy with MetaNeb --Titrate oxygen keep saturation more than 88% --continue steroid  # CAP, ruled out --Started on IV Levaquin on admission.  Procal neg.  Trach aspirate grew pseudomonas today which is likely chronic colonization (past 2-3 aspirates cx all grew pseudomonas) --d/c abx today  Hyperglycemia New onset diabetes mellitus type 2 Plan insulin sliding scale per sensitivity factor Hemoglobin A1c 11.8 --increase Lantus to 35u nightly --SSI q4h while on tube feeds  Hypokalemia Replete potassium   Chronic pain medication Continue fentanyl and oxycodone  Hypercholesterolemia Zocor  Coronary artery disease Aspirin Zocor  Status post PEG tube Flushes normal saline every 6 hours  Dysphagia Per family patient can have soft diet  Anxiety and depression --continue home Remeron, paxil, seroquel   FEN: tube feed and soft diet DVT prophylaxis: Lovenox  SQ Code Status: DNR  Family Communication: not today Disposition Plan: back to SNF   Subjective and Interval History:  Pt reported feeling worse, but not wanting to answer further questions.     Objective: Vitals:   06/27/19 1628 06/27/19 1700 06/27/19 1800 06/27/19 1900  BP:  (!) 182/100 (!) 161/97   Pulse:  (!) 111 (!) 105 (!) 131  Resp:      Temp:      TempSrc:      SpO2: 99% 100% 100% 97%  Weight:      Height:        Intake/Output Summary (Last 24 hours) at 06/27/2019 1931 Last data filed at 06/27/2019 1500 Gross per 24 hour  Intake 1532.53 ml  Output 3200 ml  Net -1667.47 ml   Filed Weights   06/24/19 1936 06/25/19 1343  Weight: 72.6 kg 89.5 kg    Examination:   Constitutional: NAD, AAOx3, lethargic HEENT: conjunctivae and lids normal, EOMI CV: RRR no M,R,G. Distal pulses +2.  No cyanosis.   RESP: Diffuse Rhonchi, gurgling, trach collar on connected to suppl O2 GI: +BS, distended, PEG tube  Extremities: No effusions, edema, or tenderness in BLE SKIN: warm, dry and intact Neuro: II - XII grossly intact.  Sensation intact Psych: Depressed mood and affect.     Data Reviewed: I have personally reviewed following labs and imaging studies  CBC: Recent Labs  Lab 06/24/19 2057 06/25/19 0328 06/26/19 0733 06/27/19 0450  WBC 4.8 5.8 5.9 8.8  NEUTROABS 2.9  --   --   --   HGB 13.1 12.3 12.2 12.8  HCT 42.1 38.5 38.8 41.9  MCV 81.0 80.4 82.4 84.1  PLT 213 229 189 195  Basic Metabolic Panel: Recent Labs  Lab 06/24/19 2057 06/25/19 0328 06/26/19 0733 06/27/19 0932  NA 133* 137 138 144  K 3.0* 2.9* 3.4* 4.7  CL 97* 101 110 115*  CO2 23 24 20* 21*  GLUCOSE 563* 317* 283* 273*  BUN 7 7 8 7   CREATININE 1.01* 0.75 0.87 0.93  CALCIUM 8.8* 8.4* 8.4* 8.6*  MG  --  2.1 1.8 1.8   GFR: Estimated Creatinine Clearance: 65.7 mL/min (by C-G formula based on SCr of 0.93 mg/dL). Liver Function Tests: Recent Labs  Lab 06/25/19 0328  AST 41  ALT 23  ALKPHOS  121  BILITOT 0.7  PROT 7.5  ALBUMIN 3.6   No results for input(s): LIPASE, AMYLASE in the last 168 hours. No results for input(s): AMMONIA in the last 168 hours. Coagulation Profile: No results for input(s): INR, PROTIME in the last 168 hours. Cardiac Enzymes: No results for input(s): CKTOTAL, CKMB, CKMBINDEX, TROPONINI in the last 168 hours. BNP (last 3 results) No results for input(s): PROBNP in the last 8760 hours. HbA1C: Recent Labs    06/25/19 0328  HGBA1C 11.8*   CBG: Recent Labs  Lab 06/26/19 2338 06/27/19 0332 06/27/19 0732 06/27/19 1124 06/27/19 1617  GLUCAP 327* 298* 274* 221* 292*   Lipid Profile: No results for input(s): CHOL, HDL, LDLCALC, TRIG, CHOLHDL, LDLDIRECT in the last 72 hours. Thyroid Function Tests: No results for input(s): TSH, T4TOTAL, FREET4, T3FREE, THYROIDAB in the last 72 hours. Anemia Panel: No results for input(s): VITAMINB12, FOLATE, FERRITIN, TIBC, IRON, RETICCTPCT in the last 72 hours. Sepsis Labs: Recent Labs  Lab 06/26/19 0733 06/27/19 1630  PROCALCITON <0.10  --   LATICACIDVEN  --  2.4*    Recent Results (from the past 240 hour(s))  Culture, respiratory     Status: None   Collection Time: 06/24/19  7:44 PM   Specimen: Tracheal Aspirate  Result Value Ref Range Status   Specimen Description   Final    TRACHEAL ASPIRATE Performed at [facility], [address] [place], [place] [address]    Special Requests   Final    NONE Performed at [facility], [address] [place], [place] [address]    Gram Stain   Final    RARE WBC PRESENT,BOTH PMN AND MONONUCLEAR ABUNDANT GRAM NEGATIVE RODS RARE GRAM POSITIVE RODS RARE GRAM POSITIVE COCCI IN PAIRS RARE BUDDING YEAST SEEN Performed at [facility], [place] [address], [place], [place] [address]    Culture ABUNDANT PSEUDOMONAS AERUGINOSA  Final   Report Status 06/27/2019 FINAL  Final   Organism ID, Bacteria PSEUDOMONAS AERUGINOSA  Final       Susceptibility   Pseudomonas aeruginosa - MIC*    CEFTAZIDIME 4 SENSITIVE Sensitive     CIPROFLOXACIN >=4 RESISTANT Resistant     GENTAMICIN 8 INTERMEDIATE Intermediate     IMIPENEM 2 SENSITIVE Sensitive     PIP/TAZO 8 SENSITIVE Sensitive     * ABUNDANT PSEUDOMONAS AERUGINOSA  SARS CORONAVIRUS 2 (TAT 6-24 HRS) Nasopharyngeal Nasopharyngeal Swab     Status: None   Collection Time: 06/24/19  7:45 PM   Specimen: Nasopharyngeal Swab  Result Value Ref Range Status   SARS Coronavirus 2 NEGATIVE NEGATIVE Final    Comment: (NOTE) SARS-CoV-2 target nucleic acids are NOT DETECTED. The SARS-CoV-2 RNA is generally detectable in upper and lower respiratory specimens during the acute phase of infection. Negative results do not preclude SARS-CoV-2 infection, do not rule out co-infections with other pathogens, and  should not be used as the sole basis for treatment or other patient management decisions. Negative results must be combined with clinical observations, patient history, and epidemiological information. The expected result is Negative. Fact Sheet for Patients: SugarRoll.be Fact Sheet for Healthcare Providers: https://www.woods-mathews.com/ This test is not yet approved or cleared by the [place] FDA and  has been authorized for detection and/or diagnosis of SARS-CoV-2 by FDA under an Emergency Use Authorization (EUA). This EUA will remain  in effect (meaning this test can be used) for the duration of the COVID-19 declaration under Section 56 4(b)(1) of the Act, 21 U.S.C. section 360bbb-3(b)(1), unless the authorization is terminated or revoked sooner. Performed at [facility], [place] [address], [place], [place] [address]   MRSA PCR Screening     Status: Abnormal   Collection Time: 06/25/19  1:40 PM   Specimen: Nasal Mucosa; Nasopharyngeal  Result Value Ref Range Status   MRSA by PCR POSITIVE (A) NEGATIVE Final    Comment:         The GeneXpert MRSA Assay (FDA approved for NASAL specimens only), is one component of a comprehensive MRSA colonization surveillance program. It is not intended to diagnose MRSA infection nor to guide or monitor treatment for MRSA infections. RESULT CALLED TO, READ BACK BY AND VERIFIED WITH: Adalberto Ill 06/25/19 at 1516. CST Performed at [facility], [address] [place], [place] [address]       Radiology Studies: DG Chest [place] 1 View  Result Date: 06/27/2019 CLINICAL DATA:  Respiratory failure EXAM: PORTABLE CHEST 1 VIEW COMPARISON:  06/24/2019 FINDINGS: Tracheostomy tube tip is at the level of the clavicular heads. Cardiomediastinal contours are normal. Lungs are clear. There is chronic hyperinflation. IMPRESSION: COPD without focal consolidation. Electronically Signed   By: Ulyses Jarred M.D.   On: 06/27/2019 05:33     Scheduled Meds: . aspirin  81 mg Oral Daily  . budesonide  0.25 mg Nebulization BID  . Chlorhexidine Gluconate Cloth  6 each Topical Daily  . clonazePAM  2 mg Oral BID  . enoxaparin (LOVENOX) injection  40 mg Subcutaneous Q24H  . famotidine  20 mg Per Tube Daily  . fentaNYL  1 patch Transdermal Q72H  . fentaNYL (SUBLIMAZE) injection  25 mcg Intravenous Once  . fluticasone  1 spray Each Nare Daily  . insulin aspart  0-15 Units Subcutaneous Q4H  . insulin glargine  35 Units Subcutaneous QHS  . living well with diabetes book   Does not apply Once  . loratadine  10 mg Oral Daily  . mirtazapine  30 mg Oral QHS  . mupirocin ointment  1 application Nasal BID  . PARoxetine  20 mg Per Tube Daily  . pneumococcal 23 valent vaccine  0.5 mL Intramuscular Tomorrow-1000  . [START ON 06/28/2019] predniSONE  30 mg Oral Q breakfast  . Ensure Max Protein  11 oz Oral BID  . QUEtiapine  25 mg Per Tube QHS  . senna  1-2 tablet Per Tube QHS  . simvastatin  40 mg Oral QPM  . sodium chloride flush  3 mL Intravenous Q12H  . sodium chloride irrigation  50 mL Per  Tube Q6H   Continuous Infusions: . sodium chloride    . 0.9 % NaCl with KCl 20 mEq / L 20 mL/hr at 06/27/19 1746  . dexmedetomidine (PRECEDEX) IV infusion    . lactated ringers       LOS: 3 days     Enzo Bi, MD  [facility] If 7PM-7AM, please contact night-coverage 06/27/2019, 7:31 PM

## 2019-06-27 NOTE — Progress Notes (Addendum)
1600 Bed, bath and poop all in 45 minutes therefore patient started to have a panic attack. After 2nd poop and incontinent of both poop and urine patient escalated into a full blown out of control anxiety attack. Dr. Lanney Gins contacted for prn order. Given with very little success.  Daughter in to visit and help calm patient. Precedex drip NOT restarted. Discussed with patient that her oxygen and breathing were great but her anxiety was harming her ability to cope. During this time her respiratory rate was 21-25, Her oxygen saturation was 93-100%. Heart rate was 110's. Will further discuss another prn for anxiety with MD.

## 2019-06-27 NOTE — Progress Notes (Signed)
Patient desires to eat and drink. Switched back to SERVO home vent. Patient and family report she eats and drinks on home ventilator.

## 2019-06-27 NOTE — Progress Notes (Signed)
CRITICAL CARE PROGRESS NOTE    Name: Michelle [name] MRN: [id number] DOB: 1960/07/24     LOS: 3   SUBJECTIVE FINDINGS & SIGNIFICANT EVENTS   Patient description:   HPI: Michelle [name] is a 59 y.o. female with medical history significant of COPD, CHF, hyperlipidemia, coronary artery disease, hypertension, hypercholesterolemia, status post tracheostomy, came with a chief complaint of mild shortness of breath with yellow productive sputum.  She is on 4 L of oxygen all the time. In the emergency room in mild respiratory distressWith chest x-ray small lower lobe infiltrate, potassium 3.0, blood sugar 563, normal anion gap normal BUN and creatinine BNP 42 troponin 20,, normal white count, hemoglobin 13.1 Electrocardiogram normal sinus with right bundle branch block    Lines / Drains: PIVx2  Cultures / Sepsis markers: respiratory  Antibiotics: Levaquin   Protocols / Consultants: Hospitalist and critical care  06/26/19-patient had another episode of resp distress overnight 06/27/19- patient clinically improved, serial lavages via tracheal catheter to aspirate mucus plugging done by RT today, patient feels better.   PAST MEDICAL HISTORY   Past Medical History:  Diagnosis Date  . Allergy   . Anxiety   . Asthma   . CHF (congestive heart failure) ([place])   . Cocaine abuse ([place])   . COPD (chronic obstructive pulmonary disease) ([place])   . Coronary artery disease   . Emphysema/COPD ([place])   . Hepatitis C   . High cholesterol   . Hypertension   . Pneumothorax   . Tobacco abuse      SURGICAL HISTORY   Past Surgical History:  Procedure Laterality Date  . CARDIAC CATHETERIZATION    . CHEST TUBE INSERTION    . IR GASTROSTOMY TUBE MOD SED  10/31/2017  . TRACHEOSTOMY TUBE PLACEMENT N/A 10/17/2017   Procedure:  TRACHEOSTOMY;  Surgeon: Carloyn Manner, MD;  Location: [facility] ORS;  Service: ENT;  Laterality: N/A;     FAMILY HISTORY   Family History  Problem Relation Age of Onset  . Lung cancer Mother   . Asthma Mother   . CVA Father   . CAD Father   . Stroke Father   . Breast cancer Maternal Aunt 67  . COPD Sister      SOCIAL HISTORY   Social History   Tobacco Use  . Smoking status: Former Smoker    Packs/day: 0.10    Years: 41.00    Pack years: 4.10    Types: Cigarettes    Quit date: 06/09/2017    Years since quitting: 2.0  . Smokeless tobacco: Never Used  Substance Use Topics  . Alcohol use: No  . Drug use: Yes    Types: "Crack" cocaine    Comment: Past cocaine use.  Quit 04/2017     MEDICATIONS   Current Medication:  Current Facility-Administered Medications:  .  0.9 %  sodium chloride infusion, 250 mL, Intravenous, PRN, Cristescu, Mircea G, MD .  0.9 % NaCl with KCl 20 mEq/ L  infusion, , Intravenous, Continuous, Cristescu, Mircea G, MD, Last Rate: 75 mL/hr at 06/27/19 0600, Rate Verify at 06/27/19 0600 .  alum & mag hydroxide-simeth (MAALOX/MYLANTA) 200-200-20 MG/5ML suspension 15 mL, 15 mL, Oral, Q6H PRN, Enzo Bi, MD .  aspirin chewable tablet 81 mg, 81 mg, Oral, Daily, Cristescu, Mircea G, MD, 81 mg at 06/27/19 1029 .  budesonide (PULMICORT) nebulizer solution 0.25 mg, 0.25 mg, Nebulization, BID, Cristescu, Mircea G, MD, 0.25 mg at 06/27/19 0800 .  calcium carbonate (TUMS -  dosed in mg elemental calcium) chewable tablet 200 mg of elemental calcium, 1 tablet, Oral, TID PRN, Enzo Bi, MD .  Chlorhexidine Gluconate Cloth 2 % PADS 6 each, 6 each, Topical, Daily, Enzo Bi, MD, 6 each at 06/26/19 1721 .  clonazePAM (KLONOPIN) tablet 2 mg, 2 mg, Oral, BID, Cristescu, Mircea G, MD, 2 mg at 06/27/19 1029 .  dexmedetomidine (PRECEDEX) 400 MCG/100ML (4 mcg/mL) infusion, 0.4-0.8 mcg/kg/hr, Intravenous, Titrated, Blakeney, Dreama Saa, NP, Last Rate: 4.48 mL/hr at 06/27/19 1119, 0.2  mcg/kg/hr at 06/27/19 1119 .  docusate sodium (COLACE) capsule 100 mg, 100 mg, Oral, BID PRN, Enzo Bi, MD .  enoxaparin (LOVENOX) injection 40 mg, 40 mg, Subcutaneous, Q24H, Cristescu, Linard Millers, MD, 40 mg at 06/26/19 2253 .  famotidine (PEPCID) tablet 20 mg, 20 mg, Per Tube, Daily, Cristescu, Mircea G, MD, 20 mg at 06/27/19 1029 .  fentaNYL (DURAGESIC) 25 MCG/HR 1 patch, 1 patch, Transdermal, Q72H, Cristescu, Linard Millers, MD, 1 patch at 06/25/19 0824 .  fluticasone (FLONASE) 50 MCG/ACT nasal spray 1 spray, 1 spray, Each Nare, Daily, Cristescu, Mircea G, MD, 1 spray at 06/27/19 1031 .  guaiFENesin-dextromethorphan (ROBITUSSIN DM) 100-10 MG/5ML syrup 10 mL, 10 mL, Oral, Q6H PRN, Enzo Bi, MD .  insulin aspart (novoLOG) injection 0-15 Units, 0-15 Units, Subcutaneous, Q4H, Cristescu, Linard Millers, MD, 8 Units at 06/27/19 0900 .  insulin glargine (LANTUS) injection 30 Units, 30 Units, Subcutaneous, QHS, Enzo Bi, MD .  ipratropium-albuterol (DUONEB) 0.5-2.5 (3) MG/3ML nebulizer solution 3 mL, 3 mL, Nebulization, Q6H PRN, Cristescu, Mircea G, MD, 3 mL at 06/27/19 0801 .  levofloxacin (LEVAQUIN) IVPB 750 mg, 750 mg, Intravenous, Q24H, Cristescu, Linard Millers, MD, Stopped at 06/27/19 0023 .  living well with diabetes book MISC, , Does not apply, Once, Enzo Bi, MD .  loratadine (CLARITIN) tablet 10 mg, 10 mg, Oral, Daily, Cristescu, Mircea G, MD, 10 mg at 06/27/19 1029 .  mirtazapine (REMERON) tablet 30 mg, 30 mg, Oral, QHS, Cristescu, Mircea G, MD, 30 mg at 06/26/19 2250 .  mupirocin ointment (BACTROBAN) 2 % 1 application, 1 application, Nasal, BID, Enzo Bi, MD, 1 application at [id number] .  ondansetron (ZOFRAN) injection 4 mg, 4 mg, Intravenous, Q6H PRN, Enzo Bi, MD, 4 mg at 06/25/19 [id number] .  ondansetron (ZOFRAN-ODT) disintegrating tablet 4 mg, 4 mg, Oral, Q8H PRN, Enzo Bi, MD .  oxyCODONE-acetaminophen (PERCOCET/ROXICET) 5-325 MG per tablet 1 tablet, 1 tablet, Oral, Q8H PRN, Cristescu, Linard Millers, MD, 1  tablet at 06/26/19 1815 .  PARoxetine (PAXIL) tablet 20 mg, 20 mg, Per Tube, Daily, Cristescu, Mircea G, MD, 20 mg at 06/27/19 1030 .  pneumococcal 23 valent vaccine (PNEUMOVAX-23) injection 0.5 mL, 0.5 mL, Intramuscular, Tomorrow-1000, Enzo Bi, MD .  polyethylene glycol (MIRALAX / GLYCOLAX) packet 17 g, 17 g, Oral, BID PRN, Enzo Bi, MD .  predniSONE (DELTASONE) tablet 40 mg, 40 mg, Oral, Q breakfast, Lanney Gins, Simran Bomkamp, MD, 40 mg at 06/27/19 1029 .  protein supplement (ENSURE MAX) liquid, 11 oz, Oral, BID, Enzo Bi, MD, 11 oz at 06/27/19 1032 .  QUEtiapine (SEROQUEL) tablet 25 mg, 25 mg, Per Tube, QHS, Cristescu, Mircea G, MD, 25 mg at 06/26/19 2251 .  senna (SENOKOT) tablet 8.6-17.2 mg, 1-2 tablet, Per Tube, QHS, Cristescu, Mircea G, MD .  simvastatin (ZOCOR) tablet 40 mg, 40 mg, Oral, QPM, Cristescu, Mircea G, MD, 40 mg at 06/26/19 1815 .  sodium chloride flush (NS) 0.9 % injection 3 mL, 3 mL, Intravenous, Q12H, Cristescu, Mircea G,  MD, 3 mL at 06/27/19 1033 .  sodium chloride flush (NS) 0.9 % injection 3 mL, 3 mL, Intravenous, PRN, Cristescu, Mircea G, MD .  sodium chloride irrigation 0.9 % 50 mL, 50 mL, Per Tube, Q6H, Cristescu, Mircea G, MD, 50 mL at 06/27/19 0512    ALLERGIES   Amoxicillin    REVIEW OF SYSTEMS     10 point ROS is negative except as per subjective findings  PHYSICAL EXAMINATION   Vital Signs: Temp:  [97.6 F (36.4 C)-98.8 F (37.1 C)] 97.6 F (36.4 C) (01/23 0900) Pulse Rate:  [48-118] 64 (01/23 1100) Resp:  [16-23] 16 (01/23 1100) BP: (92-186)/(65-135) 105/66 (01/23 1100) SpO2:  [95 %-100 %] 100 % (01/23 1100) FiO2 (%):  [32 %] 32 % (01/23 1052)  GENERAL: Chronically ill-appearing HEAD: Normocephalic, atraumatic.  EYES: Pupils equal, round, reactive to light.  No scleral icterus.  MOUTH: Moist mucosal membrane. NECK: Supple. No thyromegaly. No nodules. No JVD.  Positive tracheostomy PULMONARY: Rhonchorous breath sounds bilaterally  CARDIOVASCULAR: S1 and S2. Regular rate and rhythm. No murmurs, rubs, or gallops.  GASTROINTESTINAL: Soft, nontender, non-distended. No masses. Positive bowel sounds. No hepatosplenomegaly.  MUSCULOSKELETAL: No swelling, clubbing, or edema.  NEUROLOGIC: Mild distress due to acute illness SKIN:intact,warm,dry   PERTINENT DATA     Infusions: . sodium chloride    . 0.9 % NaCl with KCl 20 mEq / L 75 mL/hr at 06/27/19 0600  . dexmedetomidine (PRECEDEX) IV infusion 0.2 mcg/kg/hr (06/27/19 1119)  . levofloxacin (LEVAQUIN) IV Stopped (06/27/19 0023)   Scheduled Medications: . aspirin  81 mg Oral Daily  . budesonide  0.25 mg Nebulization BID  . Chlorhexidine Gluconate Cloth  6 each Topical Daily  . clonazePAM  2 mg Oral BID  . enoxaparin (LOVENOX) injection  40 mg Subcutaneous Q24H  . famotidine  20 mg Per Tube Daily  . fentaNYL  1 patch Transdermal Q72H  . fluticasone  1 spray Each Nare Daily  . insulin aspart  0-15 Units Subcutaneous Q4H  . insulin glargine  30 Units Subcutaneous QHS  . living well with diabetes book   Does not apply Once  . loratadine  10 mg Oral Daily  . mirtazapine  30 mg Oral QHS  . mupirocin ointment  1 application Nasal BID  . PARoxetine  20 mg Per Tube Daily  . pneumococcal 23 valent vaccine  0.5 mL Intramuscular Tomorrow-1000  . predniSONE  40 mg Oral Q breakfast  . Ensure Max Protein  11 oz Oral BID  . QUEtiapine  25 mg Per Tube QHS  . senna  1-2 tablet Per Tube QHS  . simvastatin  40 mg Oral QPM  . sodium chloride flush  3 mL Intravenous Q12H  . sodium chloride irrigation  50 mL Per Tube Q6H   PRN Medications: sodium chloride, alum & mag hydroxide-simeth, calcium carbonate, docusate sodium, guaiFENesin-dextromethorphan, ipratropium-albuterol, ondansetron (ZOFRAN) IV, ondansetron, oxyCODONE-acetaminophen, polyethylene glycol, sodium chloride flush Hemodynamic parameters:   Intake/Output: 01/22 0701 - 01/23 0700 In: 2402 [P.O.:360; I.V.:1703.1; IV  Piggyback:338.9] Out: 3100 [Urine:3100]  Ventilator  Settings: Vent Mode: AC FiO2 (%):  [32 %] 32 % Set Rate:  [20 bmp] 20 bmp Vt Set:  [450 mL] 450 mL PEEP:  [5 cmH20] 5 cmH20    LAB RESULTS:  Basic Metabolic Panel: Recent Labs  Lab 06/24/19 2057 06/24/19 2057 06/25/19 0328 06/25/19 0328 06/26/19 0733 06/27/19 0932  NA 133*  --  137  --  138 144  K 3.0*   < >  2.9*   < > 3.4* 4.7  CL 97*  --  101  --  110 115*  CO2 23  --  24  --  20* 21*  GLUCOSE 563*  --  317*  --  283* 273*  BUN 7  --  7  --  8 7  CREATININE 1.01*  --  0.75  --  0.87 0.93  CALCIUM 8.8*  --  8.4*  --  8.4* 8.6*  MG  --   --  2.1  --  1.8 1.8   < > = values in this interval not displayed.   Liver Function Tests: Recent Labs  Lab 06/25/19 0328  AST 41  ALT 23  ALKPHOS 121  BILITOT 0.7  PROT 7.5  ALBUMIN 3.6   No results for input(s): LIPASE, AMYLASE in the last 168 hours. No results for input(s): AMMONIA in the last 168 hours. CBC: Recent Labs  Lab 06/24/19 2057 06/25/19 0328 06/26/19 0733 06/27/19 0450  WBC 4.8 5.8 5.9 8.8  NEUTROABS 2.9  --   --   --   HGB 13.1 12.3 12.2 12.8  HCT 42.1 38.5 38.8 41.9  MCV 81.0 80.4 82.4 84.1  PLT 213 229 189 195   Cardiac Enzymes: No results for input(s): CKTOTAL, CKMB, CKMBINDEX, TROPONINI in the last 168 hours. BNP: Invalid input(s): POCBNP CBG: Recent Labs  Lab 06/26/19 1939 06/26/19 2338 06/27/19 0332 06/27/19 0732 06/27/19 1124  GLUCAP 341* 327* 298* 274* 221*     IMAGING RESULTS:  Imaging: DG Chest Port 1 View  Result Date: 06/27/2019 CLINICAL DATA:  Respiratory failure EXAM: PORTABLE CHEST 1 VIEW COMPARISON:  06/24/2019 FINDINGS: Tracheostomy tube tip is at the level of the clavicular heads. Cardiomediastinal contours are normal. Lungs are clear. There is chronic hyperinflation. IMPRESSION: COPD without focal consolidation. Electronically Signed   By: Ulyses Jarred M.D.   On: 06/27/2019 05:33      ASSESSMENT AND PLAN     -Multidisciplinary rounds held today  Acute Hypoxic Respiratory Failure  -Due to acute COPD exacerbation as well as bilateral atelectasis worse at the bases -continue Bronchodilator Therapy -Chest physiotherapy with [employer] -Goal SPO2 over 88% -Continue prednisone, currently on Pulmicort-patient is improving  Severe hyperglycemia   - incresed Lantus to 35u QHS   - ISS q4h   ID -continue IV abx as prescibed -follow up cultures  GI/Nutrition GI PROPHYLAXIS as indicated DIET-->TF's as tolerated Constipation protocol as indicated  ENDO - ICU hypoglycemic\Hyperglycemia protocol -check FSBS per protocol   ELECTROLYTES -follow labs as needed -replace as needed -pharmacy consultation   DVT/GI PRX ordered -SCDs  TRANSFUSIONS AS NEEDED MONITOR FSBS ASSESS the need for LABS as needed   Critical care provider statement:    Critical care time (minutes):  33   Critical care time was exclusive of:  Separately billable procedures and treating other patients   Critical care was necessary to treat or prevent imminent or life-threatening deterioration of the following conditions:   Acute on chronic hypoxemic respiratory failure, acute COPD exacerbation, atelectasis bilaterally, multiple comorbid conditions   Critical care was time spent personally by me on the following activities:  Development of treatment plan with patient or surrogate, discussions with consultants, evaluation of patient's response to treatment, examination of patient, obtaining history from patient or surrogate, ordering and performing treatments and interventions, ordering and review of laboratory studies and re-evaluation of patient's condition.  I assumed direction of critical care for this patient from another provider in my specialty:  no    This document was prepared using Dragon voice recognition software and may include unintentional dictation errors.    Ottie Glazier, M.D.  Division of [place]

## 2019-06-27 NOTE — Progress Notes (Signed)
Patient switched from home vent to hospital owned SERVO vent per NP request. Patient was stating she cant catch her breath and feel short winded. Patient suctioned with small amount of thick tan secretions removed. PRN Duoneb breathing treatment given and Patient switched to SERVO vent. Patient tolerating vent well. States she feel "a little better". Will continue to monitor.

## 2019-06-27 NOTE — Discharge Instructions (Signed)
Fingerstick glucose (sugar) goals for home: Before meals: 80-130 mg/dl 2-Hours after meals: less than 180 mg/dl Hemoglobin A1c goal: 7% or less  Please look over the American Diabetes Association website: www.diabetes.org  Insulin Pen Instructions:  1. Remove Insulin pen cap and clean pen 1st with alcohol rub and then clean skin 2nd with alcohol rub 2. Twist insulin pen needle onto pen (right tighty) 3. Remove outer cap and inner cap from needle 4. Dial pen to 2 units and perform prime- press pen to zero and make sure liquid (insulin) comes out of the needle 5. Dial pen to your dose and perform injection into your abdomen 6. Hold needle in skin for 10 seconds after injection 7. Remove needle from insulin pen and discard 8. Place cap back on insulin pen and store safely (at room temperature) 9. Store unused pens in refrigerator and can keep opened insulin pen at room temperature (discard used pen after 30 days)  Heart Healthy, Consistent Carbohydrate Nutrition Therapy   A heart-healthy and consistent carbohydrate diet is recommended to manage heart disease and diabetes. To follow a heart-healthy and consistent carbohydrate diet, . Eat a balanced diet with whole grains, fruits and vegetables, and lean protein sources.  . Choose heart-healthy unsaturated fats. Limit saturated fats, trans fats, and cholesterol intake. Eat more plant-based or vegetarian meals using beans and soy foods for protein.  . Eat whole, unprocessed foods to limit the amount of sodium (salt) you eat.  . Choose a consistent amount of carbohydrate at each meal and snack. Limit refined carbohydrates especially sugar, sweets and sugar-sweetened beverages.  . If you drink alcohol, do so in moderation: one serving per day (women) and two servings per day (men). o One serving is equivalent to 12 ounces beer, 5 ounces wine, or 1.5 ounces distilled spirits  Tips Tips for Choosing Heart-Healthy Fats Choose lean protein and  low-fat dairy foods to reduce saturated fat intake. . Saturated fat is usually found in animal-based protein and is associated with certain health risks. Saturated fat is the biggest contributor to raise low-density lipoprotein (LDL) cholesterol levels. Research shows that limiting saturated fat lowers unhealthy cholesterol levels. Eat no more than 7% of your total calories each day from saturated fat. Ask your RDN to help you determine how much saturated fat is right for you. . There are many foods that do not contain large amounts of saturated fats. Swapping these foods to replace foods high in saturated fats will help you limit the saturated fat you eat and improve your cholesterol levels. You can also try eating more plant-based or vegetarian meals. Instead of. Try:  Whole milk, cheese, yogurt, and ice cream 1% or skim milk, low-fat cheese, non-fat yogurt, and low-fat ice cream  Fatty, marbled beef and pork Lean beef, pork, or venison  Poultry with skin Poultry without skin  Butter, stick margarine Reduced-fat, whipped, or liquid spreads  Coconut oil, palm oil Liquid vegetable oils: corn, canola, olive, soybean and safflower oils   Avoid foods that contain trans fats. . Trans fats increase levels of LDL-cholesterol. Hydrogenated fat in processed foods is the main source of trans fats in foods.  . Trans fats can be found in stick margarine, shortening, processed sweets, baked goods, some fried foods, and packaged foods made with hydrogenated oils. Avoid foods with "partially hydrogenated oil" on the ingredient list such as: cookies, pastries, baked goods, biscuits, crackers, microwave popcorn, and frozen dinners. Choose foods with heart healthy fats. . Polyunsaturated and monounsaturated fat  are unsaturated fats that may help lower your blood cholesterol level when used in place of saturated fat in your diet. . Ask your RDN about taking a dietary supplement with plant sterols and stanols to help  lower your cholesterol level. Marland Kitchen Research shows that substituting saturated fats with unsaturated fats is beneficial to cholesterol levels. Try these easy swaps: Instead of. Try:  Butter, stick margarine, or solid shortening Reduced-fat, whipped, or liquid spreads  Beef, pork, or poultry with skin Fish and seafood  Chips, crackers, snack foods Raw or unsalted nuts and seeds or nut butters Hummus with vegetables Avocado on toast  Coconut oil, palm oil Liquid vegetable oils: corn, canola, olive, soybean and safflower oils  Limit the amount of cholesterol you eat to less than 200 milligrams per day. . Cholesterol is a substance carried through the bloodstream via lipoproteins, which are known as "transporters" of fat. Some body functions need cholesterol to work properly, but too much cholesterol in the bloodstream can damage arteries and build up blood vessel linings (which can lead to heart attack and stroke). You should eat less than 200 milligrams cholesterol per day. Marland Kitchen People respond differently to eating cholesterol. There is no test available right now that can figure out which people will respond more to dietary cholesterol and which will respond less. For individuals with high intake of dietary cholesterol, different types of increase (none, small, moderate, large) in LDL-cholesterol levels are all possible.  . Food sources of cholesterol include egg yolks and organ meats such as liver, gizzards. Limit egg yolks to two to four per week and avoid organ meats like liver and gizzards to control cholesterol intake. Tips for Choosing Heart-Healthy Carbohydrates Consume a consistent amount of carbohydrate . It is important to eat foods with carbohydrates in moderation because they impact your blood glucose level. Carbohydrates can be found in many foods such as: . Grains (breads, crackers, rice, pasta, and cereals)  . Starchy Vegetables (potatoes, corn, and peas)  . Beans and legumes  . Milk, soy  milk, and yogurt  . Fruit and fruit juice  . Sweets (cakes, cookies, ice cream, jam and jelly) . Your RDN will help you set a goal for how many carbohydrate servings to eat at your meals and snacks. For many adults, eating 3 to 5 servings of carbohydrate foods at each meal and 1 or 2 carbohydrate servings for each snack works well.  . Check your blood glucose level regularly. It can tell you if you need to adjust when you eat carbohydrates. . Choose foods rich in viscous (soluble) fiber . Viscous, or soluble, is found in the walls of plant cells. Viscous fiber is found only in plant-based foods. Eating foods with fiber helps to lower your unhealthy cholesterol and keep your blood glucose in range  . Rich sources of viscous fiber include vegetables (asparagus, Brussels sprouts, sweet potatoes, turnips) fruit (apricots, mangoes, oranges), legumes, and whole grains (barley, oats, and oat bran).  . As you increase your fiber intake gradually, also increase the amount of water you drink. This will help prevent constipation.  . If you have difficulty achieving this goal, ask your RDN about fiber laxatives. Choose fiber supplements made with viscous fibers such as psyllium seed husks or methylcellulose to help lower unhealthy cholesterol.  . Limit refined carbohydrates  . There are three types of carbohydrates: starches, sugar, and fiber. Some carbohydrates occur naturally in food, like the starches in rice or corn or the sugars in fruits  and milk. Refined carbohydrates--foods with high amounts of simple sugars--can raise triglyceride levels. High triglyceride levels are associated with coronary heart disease. . Some examples of refined carbohydrate foods are table sugar, sweets, and beverages sweetened with added sugar. Tips for Reducing Sodium (Salt) Although sodium is important for your body to function, too much sodium can be harmful for people with high blood pressure. As sodium and fluid buildup in your  tissues and bloodstream, your blood pressure increases. High blood pressure may cause damage to other organs and increase your risk for a stroke. Even if you take a pill for blood pressure or a water pill (diuretic) to remove fluid, it is still important to have less salt in your diet. Ask your doctor and RDN what amount of sodium is right for you. Marland Kitchen Avoid processed foods. Eat more fresh foods.  . Fresh fruits and vegetables are naturally low in sodium, as well as frozen vegetables and fruits that have no added juices or sauces.  . Fresh meats are lower in sodium than processed meats, such as bacon, sausage, and hotdogs. Read the nutrition label or ask your butcher to help you find a fresh meat that is low in sodium. . Eat less salt--at the table and when cooking.  . A single teaspoon of table salt has 2,300 mg of sodium.  . Leave the salt out of recipes for pasta, casseroles, and soups.  . Ask your RDN how to cook your favorite recipes without sodium . Be a Paramedic.  . Look for food packages that say "salt-free" or "sodium-free." These items contain less than 5 milligrams of sodium per serving.  Marland Kitchen "Very low-sodium" products contain less than 35 milligrams of sodium per serving.  Marland Kitchen "Low-sodium" products contain less than 140 milligrams of sodium per serving.  . Beware for "Unsalted" or "No Added Salt" products. These items may still be high in sodium. Check the nutrition label. . Add flavors to your food without adding sodium.  . Try lemon juice, lime juice, fruit juice or vinegar.  . Dry or fresh herbs add flavor. Try basil, bay leaf, dill, rosemary, parsley, sage, dry mustard, nutmeg, thyme, and paprika.  . Pepper, red pepper flakes, and cayenne pepper can add spice t your meals without adding sodium. Hot sauce contains sodium, but if you use just a drop or two, it will not add up to much.  Sharyn Lull a sodium-free seasoning blend or make your own at home. Additional Lifestyle Tips Achieve and  maintain a healthy weight. . Talk with your RDN or your doctor about what is a healthy weight for you. . Set goals to reach and maintain that weight.  . To lose weight, reduce your calorie intake along with increasing your physical activity. A weight loss of 10 to 15 pounds could reduce LDL-cholesterol by 5 milligrams per deciliter. Participate in physical activity. . Talk with your health care team to find out what types of physical activity are best for you. Set a plan to get about 30 minutes of exercise on most days.  Foods Recommended Food Group Foods Recommended  Grains Whole grain breads and cereals, including whole wheat, barley, rye, buckwheat, corn, teff, quinoa, millet, amaranth, brown or wild rice, sorghum, and oats Pasta, especially whole wheat or other whole grain types  [employer], quinoa or wild rice Whole grain crackers, bread, rolls, pitas Home-made bread with reduced-sodium baking soda  Protein Foods Lean cuts of beef and pork (loin, leg, round, extra lean  hamburger)  Skinless Cytogeneticist and other wild game Dried beans and peas Nuts and nut butters Meat alternatives made with soy or textured vegetable protein  Egg whites or egg substitute Cold cuts made with lean meat or soy protein  Dairy Nonfat (skim), low-fat, or 1%-fat milk  Nonfat or low-fat yogurt or cottage cheese Fat-free and low-fat cheese  Vegetables Fresh, frozen, or canned vegetables without added fat or salt   Fruits Fresh, frozen, canned, or dried fruit   Oils Unsaturated oils (corn, olive, peanut, soy, sunflower, canola)  Soft or liquid margarines and vegetable oil spreads  Salad dressings Seeds and nuts  Avocado   Foods Not Recommended Food Group Foods Not Recommended  Grains Breads or crackers topped with salt Cereals (hot or cold) with more than 300 mg sodium per serving Biscuits, cornbread, and other "quick" breads prepared with baking soda Bread crumbs or stuffing mix from a  store High-fat bakery products, such as doughnuts, biscuits, croissants, danish pastries, pies, cookies Instant cooking foods to which you add hot water and stir--potatoes, noodles, rice, etc. Packaged starchy foods--seasoned noodle or rice dishes, stuffing mix, macaroni and cheese dinner Snacks made with partially hydrogenated oils, including chips, cheese puffs, snack mixes, regular crackers, butter-flavored popcorn  Protein Foods Higher-fat cuts of meats (ribs, t-bone steak, regular hamburger) Bacon, sausage, or hot dogs Cold cuts, such as salami or bologna, deli meats, cured meats, corned beef Organ meats (liver, brains, gizzards, sweetbreads) Poultry with skin Fried or smoked meat, poultry, and fish Whole eggs and egg yolks (more than 2-4 per week) Salted legumes, nuts, seeds, or nut/seed butters Meat alternatives with high levels of sodium (>300 mg per serving) or saturated fat (>5 g per serving)  Dairy Whole milk,?2% fat milk, buttermilk Whole milk yogurt or ice cream Cream Half-&-half Cream cheese Sour cream Cheese  Vegetables Canned or frozen vegetables with salt, fresh vegetables prepared with salt, butter, cheese, or cream sauce Fried vegetables Pickled vegetables such as olives, pickles, or sauerkraut  Fruits Fried fruits Fruits served with butter or cream  Oils Butter, stick margarine, shortening Partially hydrogenated oils or trans fats Tropical oils (coconut, palm, palm kernel oils)  Other Candy, sugar sweetened soft drinks and desserts Salt, sea salt, garlic salt, and seasoning mixes containing salt Bouillon cubes Ketchup, barbecue sauce, Worcestershire sauce, soy sauce, teriyaki sauce Miso Salsa Pickles, olives, relish   Heart Healthy Consistent Carbohydrate Vegetarian (Lacto-Ovo) Sample 1-Day Menu  Breakfast 1 cup oatmeal, cooked (2 carbohydrate servings)   cup blueberries (1 carbohydrate serving)  11 almonds, without salt  1 cup 1% milk (1 carbohydrate  serving)  1 cup coffee  Morning Snack 1 cup fat-free plain yogurt (1 carbohydrate serving)  Lunch 1 whole wheat bun (1 carbohydrate servings)  1 black bean burger (1 carbohydrate servings)  1 slice cheddar cheese, low sodium  2 slices tomatoes  2 leaves lettuce  1 teaspoon mustard  1 small pear (1 carbohydrate servings)  1 cup green tea, unsweetened  Afternoon Snack 1/3 cup trail mix with nuts, seeds, and raisins, without salt (1 carbohydrate servinga)  Evening Meal  cup meatless chicken  2/3 cup brown rice, cooked (2 carbohydrate servings)  1 cup broccoli, cooked (2/3 carbohydrate serving)   cup carrots, cooked (1/3 carbohydrate serving)  2 teaspoons olive oil  1 teaspoon balsamic vinegar  1 whole wheat dinner roll (1 carbohydrate serving)  1 teaspoon margarine, soft, tub  1 cup 1% milk (1 carbohydrate serving)  Evening Snack 1 extra  small banana (1 carbohydrate serving)  1 tablespoon peanut butter   Heart Healthy Consistent Carbohydrate Vegan Sample 1-Day Menu  Breakfast 1 cup oatmeal, cooked (2 carbohydrate servings)   cup blueberries (1 carbohydrate serving)  11 almonds, without salt  1 cup soymilk fortified with calcium, vitamin B12, and vitamin D  1 cup coffee  Morning Snack 6 ounces soy yogurt (1 carbohydrate servings)  Lunch 1 whole wheat bun(1 carbohydrate servings)  1 black bean burger (1 carbohydrate serving)  2 slices tomatoes  2 leaves lettuce  1 teaspoon mustard  1 small pear (1 carbohydrate servings)  1 cup green tea, unsweetened  Afternoon Snack 1/3 cup trail mix with nuts, seeds, and raisins, without salt (1 carbohydrate servings)  Evening Meal  cup meatless chicken  2/3 cup brown rice, cooked (2 carbohydrate servings)  1 cup broccoli, cooked (2/3 carbohydrate serving)   cup carrots, cooked (1/3 carbohydrate serving)  2 teaspoons olive oil  1 teaspoon balsamic vinegar  1 whole wheat dinner roll (1 carbohydrate serving)  1 teaspoon margarine,  soft, tub  1 cup soymilk fortified with calcium, vitamin B12, and vitamin D  Evening Snack 1 extra small banana (1 carbohydrate serving)  1 tablespoon peanut butter    Heart Healthy Consistent Carbohydrate Sample 1-Day Menu  Breakfast 1 cup cooked oatmeal (2 carbohydrate servings)  3/4 cup blueberries (1 carbohydrate serving)  1 ounce almonds  1 cup skim milk (1 carbohydrate serving)  1 cup coffee  Morning Snack 1 cup sugar-free nonfat yogurt (1 carbohydrate serving)  Lunch 2 slices whole-wheat bread (2 carbohydrate servings)  2 ounces lean [place] breast  1 ounce low-fat Swiss cheese  1 teaspoon mustard  1 slice tomato  1 lettuce leaf  1 small pear (1 carbohydrate serving)  1 cup skim milk (1 carbohydrate serving)  Afternoon Snack 1 ounce trail mix with unsalted nuts, seeds, and raisins (1 carbohydrate serving)  Evening Meal 3 ounces salmon  2/3 cup cooked brown rice (2 carbohydrate servings)  1 teaspoon soft margarine  1 cup cooked broccoli with 1/2 cup cooked carrots (1 carbohydrate serving  Carrots, cooked, boiled, drained, without salt  1 cup lettuce  1 teaspoon olive oil with vinegar for dressing  1 small whole grain roll (1 carbohydrate serving)  1 teaspoon soft margarine  1 cup unsweetened tea  Evening Snack 1 extra-small banana (1 carbohydrate serving)  Copyright 2020  [employer]. All rights reserved.

## 2019-06-28 LAB — CBC WITH DIFFERENTIAL/PLATELET
Abs Immature Granulocytes: 0.1 10*3/uL — ABNORMAL HIGH (ref 0.00–0.07)
Basophils Absolute: 0 10*3/uL (ref 0.0–0.1)
Basophils Relative: 0 %
Eosinophils Absolute: 0 10*3/uL (ref 0.0–0.5)
Eosinophils Relative: 0 %
HCT: 46.9 % — ABNORMAL HIGH (ref 36.0–46.0)
Hemoglobin: 14.4 g/dL (ref 12.0–15.0)
Immature Granulocytes: 1 %
Lymphocytes Relative: 9 %
Lymphs Abs: 1 10*3/uL (ref 0.7–4.0)
MCH: 25.7 pg — ABNORMAL LOW (ref 26.0–34.0)
MCHC: 30.7 g/dL (ref 30.0–36.0)
MCV: 83.8 fL (ref 80.0–100.0)
Monocytes Absolute: 0.7 10*3/uL (ref 0.1–1.0)
Monocytes Relative: 6 %
Neutro Abs: 9.3 10*3/uL — ABNORMAL HIGH (ref 1.7–7.7)
Neutrophils Relative %: 84 %
Platelets: 279 10*3/uL (ref 150–400)
RBC: 5.6 MIL/uL — ABNORMAL HIGH (ref 3.87–5.11)
RDW: 15.9 % — ABNORMAL HIGH (ref 11.5–15.5)
WBC: 11 10*3/uL — ABNORMAL HIGH (ref 4.0–10.5)
nRBC: 0 % (ref 0.0–0.2)

## 2019-06-28 LAB — GLUCOSE, CAPILLARY
Glucose-Capillary: 281 mg/dL — ABNORMAL HIGH (ref 70–99)
Glucose-Capillary: 319 mg/dL — ABNORMAL HIGH (ref 70–99)
Glucose-Capillary: 323 mg/dL — ABNORMAL HIGH (ref 70–99)
Glucose-Capillary: 332 mg/dL — ABNORMAL HIGH (ref 70–99)
Glucose-Capillary: 340 mg/dL — ABNORMAL HIGH (ref 70–99)
Glucose-Capillary: 355 mg/dL — ABNORMAL HIGH (ref 70–99)

## 2019-06-28 LAB — BASIC METABOLIC PANEL
Anion gap: 10 (ref 5–15)
BUN: 12 mg/dL (ref 6–20)
CO2: 24 mmol/L (ref 22–32)
Calcium: 9.1 mg/dL (ref 8.9–10.3)
Chloride: 106 mmol/L (ref 98–111)
Creatinine, Ser: 1.05 mg/dL — ABNORMAL HIGH (ref 0.44–1.00)
GFR calc Af Amer: >60 mL/min (ref >60)
GFR calc non Af Amer: 58 mL/min — ABNORMAL LOW (ref >60)
Glucose, Bld: 334 mg/dL — ABNORMAL HIGH (ref 70–99)
Potassium: 5.2 mmol/L — ABNORMAL HIGH (ref 3.5–5.1)
Sodium: 140 mmol/L (ref 135–145)

## 2019-06-28 LAB — MAGNESIUM: Magnesium: 1.6 mg/dL — ABNORMAL LOW (ref 1.7–2.4)

## 2019-06-28 MED ORDER — TOBRAMYCIN 300 MG/5ML IN NEBU
300.0000 mg | INHALATION_SOLUTION | Freq: Two times a day (BID) | RESPIRATORY_TRACT | Status: DC
  Administered 2019-06-28 – 2019-06-30 (×4): 300 mg via RESPIRATORY_TRACT
  Filled 2019-06-28 (×7): qty 5

## 2019-06-28 MED ORDER — BUDESONIDE 0.25 MG/2ML IN SUSP
0.2500 mg | Freq: Two times a day (BID) | RESPIRATORY_TRACT | Status: DC
  Administered 2019-06-28 – 2019-07-05 (×14): 0.25 mg via RESPIRATORY_TRACT
  Filled 2019-06-28 (×14): qty 2

## 2019-06-28 MED ORDER — INSULIN GLARGINE 100 UNIT/ML ~~LOC~~ SOLN
45.0000 [IU] | Freq: Every day | SUBCUTANEOUS | Status: DC
  Administered 2019-06-28 – 2019-06-29 (×2): 45 [IU] via SUBCUTANEOUS
  Filled 2019-06-28 (×3): qty 0.45

## 2019-06-28 MED ORDER — SODIUM POLYSTYRENE SULFONATE 15 GM/60ML PO SUSP
30.0000 g | Freq: Once | ORAL | Status: AC
  Administered 2019-06-28: 13:00:00 30 g
  Filled 2019-06-28: qty 120

## 2019-06-28 MED ORDER — MAGNESIUM SULFATE 2 GM/50ML IV SOLN
2.0000 g | Freq: Once | INTRAVENOUS | Status: AC
  Administered 2019-06-28: 06:00:00 2 g via INTRAVENOUS
  Filled 2019-06-28: qty 50

## 2019-06-28 MED ORDER — NOREPINEPHRINE 4 MG/250ML-% IV SOLN
2.0000 ug/min | INTRAVENOUS | Status: DC
  Administered 2019-06-28: 06:00:00 3 ug/min via INTRAVENOUS
  Filled 2019-06-28: qty 250

## 2019-06-28 MED ORDER — SODIUM CHLORIDE 0.9 % IV SOLN: 250.0000 mL | INTRAVENOUS | Status: DC

## 2019-06-28 MED ORDER — QUETIAPINE FUMARATE 25 MG PO TABS
50.0000 mg | ORAL_TABLET | Freq: Every day | ORAL | Status: DC
  Administered 2019-06-28 – 2019-06-29 (×2): 50 mg
  Filled 2019-06-28 (×2): qty 2

## 2019-06-28 NOTE — Progress Notes (Signed)
PROGRESS NOTE    Michelle [name]  [id number] DOB: [birth year] DOA: 06/24/2019 PCP: Frazier Richards, MD    Assessment & Plan:   Principal Problem:   COPD exacerbation 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 female with medical history significant of COPD, CHF, hyperlipidemia, coronary artery disease, hypertension, hypercholesterolemia, status post tracheostomy, came with a chief complaint of mild shortness of breath with yellow productive sputum.  She is on 4 L of oxygen all the time.   # Acute on chronic respiratory failure with hypoxemia 2/2 # COPD exacerbation and bilateral atelectasis  --baseline 4L O2 --Pt presented with increased cough, sputum production, and wheezing PLAN: --DuoNebs, Pulmicort -Chest physiotherapy with MetaNeb --Titrate oxygen keep saturation more than 88% --continue steroid taper --Start Tobra neb BID today  # CAP, ruled out --Started on IV Levaquin on admission.  Procal neg.  Trach aspirate grew pseudomonas today which is likely chronic colonization (past 2-3 aspirates cx all grew pseudomonas) --abx d/c'ed --Start Tobra neb BID today  Hyperglycemia New onset diabetes mellitus type 2 Plan insulin sliding scale per sensitivity factor Hemoglobin A1c 11.8 --increase Lantus to 45u nightly --SSI q4h while on tube feeds  Hypokalemia and Hyperkalemia  Chronic pain medication Continue fentanyl and oxycodone  Hypercholesterolemia Zocor  Coronary artery disease Aspirin Zocor  Status post PEG tube Flushes normal saline every 6 hours  Dysphagia Per family patient can have soft diet  Anxiety and depression --continue home Remeron, paxil,  seroquel   FEN: tube feed and soft diet DVT prophylaxis: Lovenox SQ Code Status: DNR  Family Communication: not today Disposition Plan: back home   Subjective and Interval History:  Not interactive, just sleeping.  Got started on pressor for hypotension overnight and turned off this morning.  No noted fever, N/V/D.   Objective: Vitals:   06/28/19 1300 06/28/19 1400 06/28/19 1500 06/28/19 1600  BP: (!) 87/66 (!) 202/129 (!) 179/123 (!) 145/97  Pulse: 70 (!) 123  (!) 101  Resp:      Temp:    97.9 F (36.6 C)  TempSrc:    Oral  SpO2: 100% 99%  100%  Weight:      Height:        Intake/Output Summary (Last 24 hours) at 06/28/2019 1655 Last data filed at 06/28/2019 1200 Gross per 24 hour  Intake 1455.48 ml  Output 2200 ml  Net -744.52 ml   Filed Weights   06/24/19 1936 06/25/19 1343  Weight: 72.6 kg 89.5 kg    Examination:   Constitutional: NAD, AAOx3, lethargic HEENT: conjunctivae and lids normal, EOMI CV: RRR no M,R,G. Distal pulses +2.  No cyanosis.   RESP: Diffuse Rhonchi, gurgling, trach collar on connected to suppl O2 GI: +BS, distended, PEG tube  Extremities: No effusions, edema, or tenderness in BLE SKIN: warm, dry and intact Neuro: II - XII grossly intact.  Sensation intact Psych: Depressed mood and affect.     Data Reviewed: I have personally reviewed following labs and imaging studies  CBC: Recent Labs  Lab 06/24/19 2057 06/25/19 0328 06/26/19 0733 06/27/19 0450 06/28/19 0429  WBC 4.8 5.8 5.9 8.8 11.0*  NEUTROABS 2.9  --   --   --  9.3*  HGB 13.1 12.3 12.2 12.8 14.4  HCT 42.1 38.5 38.8 41.9 46.9*  MCV 81.0 80.4 82.4 84.1 83.8  PLT 213 229 189 195 [id number]   Basic Metabolic Panel: Recent Labs  Lab 06/24/19 2057 06/25/19 0328 06/26/19 0733 06/27/19 0932 06/28/19 0429  NA 133* 137 138 144 140  K 3.0* 2.9* 3.4* 4.7 5.2*  CL 97* 101 110 115* 106  CO2 23 24 20* 21* 24  GLUCOSE 563* 317* 283* 273* 334*  BUN 7 7 8 7 12   CREATININE 1.01* 0.75  0.87 0.93 1.05*  CALCIUM 8.8* 8.4* 8.4* 8.6* 9.1  MG  --  2.1 1.8 1.8 1.6*   GFR: Estimated Creatinine Clearance: 58.2 mL/min (A) (by C-G formula based on SCr of 1.05 mg/dL (H)). Liver Function Tests: Recent Labs  Lab 06/25/19 0328  AST 41  ALT 23  ALKPHOS 121  BILITOT 0.7  PROT 7.5  ALBUMIN 3.6   No results for input(s): LIPASE, AMYLASE in the last 168 hours. No results for input(s): AMMONIA in the last 168 hours. Coagulation Profile: No results for input(s): INR, PROTIME in the last 168 hours. Cardiac Enzymes: No results for input(s): CKTOTAL, CKMB, CKMBINDEX, TROPONINI in the last 168 hours. BNP (last 3 results) No results for input(s): PROBNP in the last 8760 hours. HbA1C: No results for input(s): HGBA1C in the last 72 hours. CBG: Recent Labs  Lab 06/27/19 2341 06/28/19 0351 06/28/19 0730 06/28/19 1155 06/28/19 1602  GLUCAP 336* 332* 340* 281* 323*   Lipid Profile: No results for input(s): CHOL, HDL, LDLCALC, TRIG, CHOLHDL, LDLDIRECT in the last 72 hours. Thyroid Function Tests: No results for input(s): TSH, T4TOTAL, FREET4, T3FREE, THYROIDAB in the last 72 hours. Anemia Panel: No results for input(s): VITAMINB12, FOLATE, FERRITIN, TIBC, IRON, RETICCTPCT in the last 72 hours. Sepsis Labs: Recent Labs  Lab 06/26/19 0733 06/27/19 1630 06/27/19 1849  PROCALCITON <0.10  --   --   LATICACIDVEN  --  2.4* 2.3*    Recent Results (from the past 240 hour(s))  Culture, respiratory     Status: None   Collection Time: 06/24/19  7:44 PM   Specimen: Tracheal Aspirate  Result Value Ref Range Status   Specimen Description   Final    TRACHEAL ASPIRATE Performed at [facility], [address] [place], [place] [address]    Special Requests   Final    NONE Performed at [place], [place] [address]    Gram Stain   Final    RARE WBC PRESENT,BOTH PMN AND MONONUCLEAR ABUNDANT GRAM NEGATIVE RODS RARE GRAM POSITIVE  RODS RARE GRAM POSITIVE COCCI IN PAIRS RARE BUDDING YEAST SEEN Performed at [facility], [place] [address], [place], [place] [address]    Culture ABUNDANT PSEUDOMONAS AERUGINOSA  Final   Report Status 06/27/2019 FINAL  Final   Organism ID, Bacteria PSEUDOMONAS AERUGINOSA  Final      Susceptibility   Pseudomonas aeruginosa - MIC*    CEFTAZIDIME 4 SENSITIVE Sensitive     CIPROFLOXACIN >=4 RESISTANT Resistant     GENTAMICIN 8 INTERMEDIATE Intermediate     IMIPENEM 2 SENSITIVE Sensitive     PIP/TAZO 8 SENSITIVE Sensitive     * ABUNDANT PSEUDOMONAS AERUGINOSA  SARS CORONAVIRUS 2 (TAT 6-24 HRS) Nasopharyngeal Nasopharyngeal Swab     Status: None   Collection Time: 06/24/19  7:45 PM   Specimen: Nasopharyngeal Swab  Result Value Ref Range Status   SARS Coronavirus 2 NEGATIVE NEGATIVE Final  Comment: (NOTE) SARS-CoV-2 target nucleic acids are NOT DETECTED. The SARS-CoV-2 RNA is generally detectable in upper and lower respiratory specimens during the acute phase of infection. Negative results do not preclude SARS-CoV-2 infection, do not rule out co-infections with other pathogens, and should not be used as the sole basis for treatment or other patient management decisions. Negative results must be combined with clinical observations, patient history, and epidemiological information. The expected result is Negative. Fact Sheet for Patients: SugarRoll.be Fact Sheet for Healthcare Providers: https://www.woods-mathews.com/ This test is not yet approved or cleared by the [place] FDA and  has been authorized for detection and/or diagnosis of SARS-CoV-2 by FDA under an Emergency Use Authorization (EUA). This EUA will remain  in effect (meaning this test can be used) for the duration of the COVID-19 declaration under Section 56 4(b)(1) of the Act, 21 U.S.C. section 360bbb-3(b)(1), unless the authorization is terminated or revoked  sooner. Performed at [facility], [place] [address] [place], [place] [address]   MRSA PCR Screening     Status: Abnormal   Collection Time: 06/25/19  1:40 PM   Specimen: Nasal Mucosa; Nasopharyngeal  Result Value Ref Range Status   MRSA by PCR POSITIVE (A) NEGATIVE Final    Comment:        The GeneXpert MRSA Assay (FDA approved for NASAL specimens only), is one component of a comprehensive MRSA colonization surveillance program. It is not intended to diagnose MRSA infection nor to guide or monitor treatment for MRSA infections. RESULT CALLED TO, READ BACK BY AND VERIFIED WITH: Adalberto Ill 06/25/19 at 1516. CST Performed at [facility], [address] [place], [place] [address]   Culture, respiratory (non-expectorated)     Status: None (Preliminary result)   Collection Time: 06/27/19  8:53 PM   Specimen: Tracheal Aspirate; Respiratory  Result Value Ref Range Status   Specimen Description   Final    TRACHEAL ASPIRATE Performed at [facility], [address] [place], [place] [address]    Special Requests   Final    NONE Performed at [facility], [address] [place], [place] [address]    Gram Stain   Final    ABUNDANT WBC PRESENT, PREDOMINANTLY PMN ABUNDANT GRAM NEGATIVE RODS RARE GRAM POSITIVE RODS RARE YEAST RARE GRAM POSITIVE COCCI Performed at [facility], [place] [address] [place], [place] [address]    Culture PENDING  Incomplete   Report Status PENDING  Incomplete      Radiology Studies: DG Chest Port 1 View  Result Date: 06/27/2019 CLINICAL DATA:  Respiratory failure EXAM: PORTABLE CHEST 1 VIEW COMPARISON:  06/24/2019 FINDINGS: Tracheostomy tube tip is at the level of the clavicular heads. Cardiomediastinal contours are normal. Lungs are clear. There is chronic hyperinflation. IMPRESSION: COPD without focal consolidation. Electronically Signed   By: Ulyses Jarred M.D.   On: 06/27/2019 05:33     Scheduled  Meds: . aspirin  81 mg Oral Daily  . budesonide  0.25 mg Nebulization BID  . Chlorhexidine Gluconate Cloth  6 each Topical Daily  . clonazePAM  2 mg Oral BID  . cloNIDine  0.1 mg Transdermal Weekly  . enoxaparin (LOVENOX) injection  40 mg Subcutaneous Q24H  . famotidine  20 mg Per Tube Daily  . fentaNYL  1 patch Transdermal Q72H  . fluticasone  1 spray Each Nare Daily  . insulin aspart  0-15 Units Subcutaneous Q4H  . insulin glargine  45 Units Subcutaneous QHS  .  living well with diabetes book   Does not apply Once  . loratadine  10 mg Oral Daily  . mirtazapine  30 mg Oral QHS  . mupirocin ointment  1 application Nasal BID  . PARoxetine  20 mg Per Tube Daily  . pneumococcal 23 valent vaccine  0.5 mL Intramuscular Tomorrow-1000  . predniSONE  30 mg Oral Q breakfast  . Ensure Max Protein  11 oz Oral BID  . QUEtiapine  50 mg Per Tube QHS  . senna  1-2 tablet Per Tube QHS  . simvastatin  40 mg Oral QPM  . sodium chloride flush  3 mL Intravenous Q12H  . sodium chloride irrigation  50 mL Per Tube Q6H  . tobramycin (PF)  300 mg Nebulization BID   Continuous Infusions: . sodium chloride    . sodium chloride Stopped (06/28/19 0532)  . dexmedetomidine (PRECEDEX) IV infusion Stopped (06/28/19 0525)  . lactated ringers    . norepinephrine (LEVOPHED) Adult infusion Stopped (06/28/19 1030)     LOS: 4 days     Enzo Bi, MD [facility] If 7PM-7AM, please contact night-coverage 06/28/2019, 4:55 PM

## 2019-06-28 NOTE — Progress Notes (Signed)
Sent Dr. Billie Ruddy a secure chat at [phone] this morning regarding pt's K of 5.2, shows that the message has been seen. No new orders at this time.

## 2019-06-28 NOTE — Progress Notes (Signed)
CRITICAL CARE PROGRESS NOTE    Name: Michelle [name] MRN: [id number] DOB: 1961/04/21     LOS: 4   SUBJECTIVE FINDINGS & SIGNIFICANT EVENTS   Patient description:   HPI: Michelle [name] is a 59 y.o. female with medical history significant of COPD, CHF, hyperlipidemia, coronary artery disease, hypertension, hypercholesterolemia, status post tracheostomy, came with a chief complaint of mild shortness of breath with yellow productive sputum.  She is on 4 L of oxygen all the time. In the emergency room in mild respiratory distressWith chest x-ray small lower lobe infiltrate, potassium 3.0, blood sugar 563, normal anion gap normal BUN and creatinine BNP 42 troponin 20,, normal white count, hemoglobin 13.1     Lines / Drains: PIVx2  Cultures / Sepsis markers: respiratory  Antibiotics: Levaquin   Protocols / Consultants: Hospitalist and critical care  06/26/19-patient had another episode of resp distress overnight 06/27/19- patient clinically improved, serial lavages via tracheal catheter to aspirate mucus plugging done by RT today, patient feels better.  06/28/19- off precedex infusion, patient with pseudomonas colonization however repeated bouts of respiratory distress and readmissions, will start TOBI bid today, plan to d/c home soon if continues to stay off precedex  PAST MEDICAL HISTORY   Past Medical History:  Diagnosis Date  . Allergy   . Anxiety   . Asthma   . CHF (congestive heart failure) ([place])   . Cocaine abuse ([place])   . COPD (chronic obstructive pulmonary disease) ([place])   . Coronary artery disease   . Emphysema/COPD ([place])   . Hepatitis C   . High cholesterol   . Hypertension   . Pneumothorax   . Tobacco abuse      SURGICAL HISTORY   Past Surgical History:  Procedure Laterality Date  .  CARDIAC CATHETERIZATION    . CHEST TUBE INSERTION    . IR GASTROSTOMY TUBE MOD SED  10/31/2017  . TRACHEOSTOMY TUBE PLACEMENT N/A 10/17/2017   Procedure: TRACHEOSTOMY;  Surgeon: Carloyn Manner, MD;  Location: [facility] ORS;  Service: ENT;  Laterality: N/A;     FAMILY HISTORY   Family History  Problem Relation Age of Onset  . Lung cancer Mother   . Asthma Mother   . CVA Father   . CAD Father   . Stroke Father   . Breast cancer Maternal Aunt 67  . COPD Sister      SOCIAL HISTORY   Social History   Tobacco Use  . Smoking status: Former Smoker    Packs/day: 0.10    Years: 41.00    Pack years: 4.10    Types: Cigarettes    Quit date: 06/09/2017    Years since quitting: 2.0  . Smokeless tobacco: Never Used  Substance Use Topics  . Alcohol use: No  . Drug use: Yes    Types: "Crack" cocaine    Comment: Past cocaine use.  Quit 04/2017     MEDICATIONS   Current Medication:  Current Facility-Administered Medications:  .  0.9 %  sodium chloride infusion, 250 mL, Intravenous, PRN, Cristescu, Mircea G, MD .  0.9 %  sodium chloride infusion, 250 mL, Intravenous, Continuous, Blakeney, Dreama Saa, NP, Stopped at 06/28/19 0532 .  alum & mag hydroxide-simeth (MAALOX/MYLANTA) 200-200-20 MG/5ML suspension 15 mL, 15 mL, Oral, Q6H PRN, Enzo Bi, MD .  aspirin chewable tablet 81 mg, 81 mg, Oral, Daily, Cristescu, Mircea G, MD, 81 mg at 06/28/19 1031 .  budesonide (PULMICORT) nebulizer solution 0.25 mg, 0.25 mg, Nebulization, BID, Cristescu,  Linard Millers, MD, 0.25 mg at 06/28/19 0751 .  calcium carbonate (TUMS - dosed in mg elemental calcium) chewable tablet 200 mg of elemental calcium, 1 tablet, Oral, TID PRN, Enzo Bi, MD .  Chlorhexidine Gluconate Cloth 2 % PADS 6 each, 6 each, Topical, Daily, Enzo Bi, MD, 6 each at 06/27/19 1500 .  clonazePAM (KLONOPIN) tablet 2 mg, 2 mg, Oral, BID, Cristescu, Mircea G, MD, 2 mg at 06/28/19 1031 .  cloNIDine (CATAPRES - Dosed in mg/24 hr) patch 0.1 mg, 0.1  mg, Transdermal, Weekly, Awilda Bill, NP, 0.1 mg at 06/27/19 2258 .  dexmedetomidine (PRECEDEX) 400 MCG/100ML (4 mcg/mL) infusion, 0.4-1.2 mcg/kg/hr, Intravenous, Titrated, Awilda Bill, NP, Stopped at 06/28/19 [phone]  docusate sodium (COLACE) capsule 100 mg, 100 mg, Oral, BID PRN, Enzo Bi, MD .  enoxaparin (LOVENOX) injection 40 mg, 40 mg, Subcutaneous, Q24H, Cristescu, Mircea G, MD, 40 mg at 06/27/19 2259 .  famotidine (PEPCID) tablet 20 mg, 20 mg, Per Tube, Daily, Cristescu, Mircea G, MD, 20 mg at 06/28/19 1031 .  fentaNYL (DURAGESIC) 25 MCG/HR 1 patch, 1 patch, Transdermal, Q72H, Cristescu, Linard Millers, MD, 1 patch at 06/27/19 2316 .  fluticasone (FLONASE) 50 MCG/ACT nasal spray 1 spray, 1 spray, Each Nare, Daily, Cristescu, Mircea G, MD, 1 spray at 06/28/19 1031 .  guaiFENesin-dextromethorphan (ROBITUSSIN DM) 100-10 MG/5ML syrup 10 mL, 10 mL, Oral, Q6H PRN, Enzo Bi, MD .  hydrALAZINE (APRESOLINE) injection 10-20 mg, 10-20 mg, Intravenous, Q4H PRN, Awilda Bill, NP, 20 mg at 06/27/19 2051 .  insulin aspart (novoLOG) injection 0-15 Units, 0-15 Units, Subcutaneous, Q4H, Ottie Glazier, MD, 8 Units at 06/28/19 1209 .  insulin glargine (LANTUS) injection 45 Units, 45 Units, Subcutaneous, QHS, Enzo Bi, MD .  ipratropium-albuterol (DUONEB) 0.5-2.5 (3) MG/3ML nebulizer solution 3 mL, 3 mL, Nebulization, Q6H PRN, Cristescu, Mircea G, MD, 3 mL at 06/28/19 0751 .  lactated ringers bolus 1,000 mL, 1,000 mL, Intravenous, Once, Ottie Glazier, MD .  living well with diabetes book MISC, , Does not apply, Once, Enzo Bi, MD .  loratadine (CLARITIN) tablet 10 mg, 10 mg, Oral, Daily, Cristescu, Mircea G, MD, 10 mg at 06/28/19 1031 .  LORazepam (ATIVAN) injection 1 mg, 1 mg, Intravenous, Q4H PRN, Ottie Glazier, MD, 1 mg at 06/28/19 0803 .  mirtazapine (REMERON SOL-TAB) disintegrating tablet 30 mg, 30 mg, Oral, QHS, Aubria Vanecek, MD, 30 mg at 06/27/19 2259 .  mupirocin ointment (BACTROBAN)  2 % 1 application, 1 application, Nasal, BID, Enzo Bi, MD, 1 application at [phone] .  norepinephrine (LEVOPHED) 4mg  in 262mL premix infusion, 2-10 mcg/min, Intravenous, Titrated, Awilda Bill, NP, Stopped at 06/28/19 1030 .  ondansetron (ZOFRAN) injection 4 mg, 4 mg, Intravenous, Q6H PRN, Enzo Bi, MD, 4 mg at 06/27/19 2102 .  ondansetron (ZOFRAN-ODT) disintegrating tablet 4 mg, 4 mg, Oral, Q8H PRN, Enzo Bi, MD .  oxyCODONE-acetaminophen (PERCOCET/ROXICET) 5-325 MG per tablet 1 tablet, 1 tablet, Oral, Q8H PRN, Cristescu, Linard Millers, MD, 1 tablet at 06/28/19 0608 .  PARoxetine (PAXIL) tablet 20 mg, 20 mg, Per Tube, Daily, Cristescu, Mircea G, MD, 20 mg at 06/28/19 1031 .  pneumococcal 23 valent vaccine (PNEUMOVAX-23) injection 0.5 mL, 0.5 mL, Intramuscular, Tomorrow-1000, Enzo Bi, MD .  polyethylene glycol (MIRALAX / GLYCOLAX) packet 17 g, 17 g, Oral, BID PRN, Enzo Bi, MD .  predniSONE (DELTASONE) tablet 30 mg, 30 mg, Oral, Q breakfast, Ottie Glazier, MD, 30 mg at 06/28/19 0802 .  protein supplement (ENSURE MAX)  liquid, 11 oz, Oral, BID, Enzo Bi, MD, 11 oz at 06/28/19 1032 .  QUEtiapine (SEROQUEL) tablet 50 mg, 50 mg, Per Tube, QHS, Blakeney, Dreama Saa, NP .  senna (SENOKOT) tablet 8.6-17.2 mg, 1-2 tablet, Per Tube, QHS, Cristescu, Mircea G, MD .  simvastatin (ZOCOR) tablet 40 mg, 40 mg, Oral, QPM, Cristescu, Mircea G, MD, 40 mg at 06/27/19 1722 .  sodium chloride flush (NS) 0.9 % injection 3 mL, 3 mL, Intravenous, Q12H, Cristescu, Mircea G, MD, 3 mL at 06/28/19 1032 .  sodium chloride flush (NS) 0.9 % injection 3 mL, 3 mL, Intravenous, PRN, Cristescu, Mircea G, MD .  sodium chloride irrigation 0.9 % 50 mL, 50 mL, Per Tube, Q6H, Cristescu, Mircea G, MD, 50 mL at 06/28/19 1032    ALLERGIES   Amoxicillin    REVIEW OF SYSTEMS     10 point ROS is negative except as per subjective findings  PHYSICAL EXAMINATION   Vital Signs: Temp:  [97.7 F (36.5 C)-98.8 F (37.1 C)]  97.8 F (36.6 C) (01/24 1200) Pulse Rate:  [58-136] 72 (01/24 1200) Resp:  [18-22] 22 (01/23 1600) BP: (76-236)/(19-163) 89/60 (01/24 1200) SpO2:  [95 %-100 %] 100 % (01/24 1200) FiO2 (%):  [28 %-36 %] 28 % (01/24 0800)  GENERAL: Chronically ill-appearing HEAD: Normocephalic, atraumatic.  EYES: Pupils equal, round, reactive to light.  No scleral icterus.  MOUTH: Moist mucosal membrane. NECK: Supple. No thyromegaly. No nodules. No JVD.  Positive tracheostomy PULMONARY: Rhonchorous breath sounds bilaterally CARDIOVASCULAR: S1 and S2. Regular rate and rhythm. No murmurs, rubs, or gallops.  GASTROINTESTINAL: Soft, nontender, non-distended. No masses. Positive bowel sounds. No hepatosplenomegaly.  MUSCULOSKELETAL: No swelling, clubbing, or edema.  NEUROLOGIC: Mild distress due to acute illness SKIN:intact,warm,dry   PERTINENT DATA     Infusions: . sodium chloride    . sodium chloride Stopped (06/28/19 0532)  . dexmedetomidine (PRECEDEX) IV infusion Stopped (06/28/19 0525)  . lactated ringers    . norepinephrine (LEVOPHED) Adult infusion Stopped (06/28/19 1030)   Scheduled Medications: . aspirin  81 mg Oral Daily  . budesonide  0.25 mg Nebulization BID  . Chlorhexidine Gluconate Cloth  6 each Topical Daily  . clonazePAM  2 mg Oral BID  . cloNIDine  0.1 mg Transdermal Weekly  . enoxaparin (LOVENOX) injection  40 mg Subcutaneous Q24H  . famotidine  20 mg Per Tube Daily  . fentaNYL  1 patch Transdermal Q72H  . fluticasone  1 spray Each Nare Daily  . insulin aspart  0-15 Units Subcutaneous Q4H  . insulin glargine  45 Units Subcutaneous QHS  . living well with diabetes book   Does not apply Once  . loratadine  10 mg Oral Daily  . mirtazapine  30 mg Oral QHS  . mupirocin ointment  1 application Nasal BID  . PARoxetine  20 mg Per Tube Daily  . pneumococcal 23 valent vaccine  0.5 mL Intramuscular Tomorrow-1000  . predniSONE  30 mg Oral Q breakfast  . Ensure Max Protein  11 oz  Oral BID  . QUEtiapine  50 mg Per Tube QHS  . senna  1-2 tablet Per Tube QHS  . simvastatin  40 mg Oral QPM  . sodium chloride flush  3 mL Intravenous Q12H  . sodium chloride irrigation  50 mL Per Tube Q6H   PRN Medications: sodium chloride, alum & mag hydroxide-simeth, calcium carbonate, docusate sodium, guaiFENesin-dextromethorphan, hydrALAZINE, ipratropium-albuterol, LORazepam, ondansetron (ZOFRAN) IV, ondansetron, oxyCODONE-acetaminophen, polyethylene glycol, sodium chloride flush Hemodynamic parameters:  Intake/Output: 01/23 0701 - 01/24 0700 In: 1368.1 [I.V.:1368.1] Out: 3400 [Urine:3400]  Ventilator  Settings: Vent Mode: PRVC FiO2 (%):  [28 %-36 %] 28 % Set Rate:  [18 bmp-20 bmp] 18 bmp Vt Set:  [450 mL-460 mL] 460 mL PEEP:  [5 cmH20] 5 cmH20 Plateau Pressure:  [18 cmH20] 18 cmH20    LAB RESULTS:  Basic Metabolic Panel: Recent Labs  Lab 06/24/19 2057 06/24/19 2057 06/25/19 0328 06/25/19 0328 06/26/19 0733 06/26/19 0733 06/27/19 0932 06/28/19 0429  NA 133*  --  137  --  138  --  144 140  K 3.0*   < > 2.9*   < > 3.4*   < > 4.7 5.2*  CL 97*  --  101  --  110  --  115* 106  CO2 23  --  24  --  20*  --  21* 24  GLUCOSE 563*  --  317*  --  283*  --  273* 334*  BUN 7  --  7  --  8  --  7 12  CREATININE 1.01*  --  0.75  --  0.87  --  0.93 1.05*  CALCIUM 8.8*  --  8.4*  --  8.4*  --  8.6* 9.1  MG  --   --  2.1  --  1.8  --  1.8 1.6*   < > = values in this interval not displayed.   Liver Function Tests: Recent Labs  Lab 06/25/19 0328  AST 41  ALT 23  ALKPHOS 121  BILITOT 0.7  PROT 7.5  ALBUMIN 3.6   No results for input(s): LIPASE, AMYLASE in the last 168 hours. No results for input(s): AMMONIA in the last 168 hours. CBC: Recent Labs  Lab 06/24/19 2057 06/25/19 0328 06/26/19 0733 06/27/19 0450 06/28/19 0429  WBC 4.8 5.8 5.9 8.8 11.0*  NEUTROABS 2.9  --   --   --  9.3*  HGB 13.1 12.3 12.2 12.8 14.4  HCT 42.1 38.5 38.8 41.9 46.9*  MCV 81.0 80.4  82.4 84.1 83.8  PLT 213 229 189 195 279   Cardiac Enzymes: No results for input(s): CKTOTAL, CKMB, CKMBINDEX, TROPONINI in the last 168 hours. BNP: Invalid input(s): POCBNP CBG: Recent Labs  Lab 06/27/19 1936 06/27/19 2341 06/28/19 0351 06/28/19 0730 06/28/19 1155  GLUCAP 298* 336* 332* 340* 281*     IMAGING RESULTS:  Imaging: DG Chest Port 1 View  Result Date: 06/27/2019 CLINICAL DATA:  Respiratory failure EXAM: PORTABLE CHEST 1 VIEW COMPARISON:  06/24/2019 FINDINGS: Tracheostomy tube tip is at the level of the clavicular heads. Cardiomediastinal contours are normal. Lungs are clear. There is chronic hyperinflation. IMPRESSION: COPD without focal consolidation. Electronically Signed   By: Ulyses Jarred M.D.   On: 06/27/2019 05:33      ASSESSMENT AND PLAN    -Multidisciplinary rounds held today  Acute Hypoxic Respiratory Failure  -Due to acute COPD exacerbation as well as bilateral atelectasis worse at the bases -continue Bronchodilator Therapy -Chest physiotherapy with MetaNeb -Goal SPO2 over 88% -Continue prednisone, currently on Pulmicort-patient is improving -chronic pseudomonas + respiratory cultures, we have stopped her IV antibiotics but I will start TOBI bid which she may benefit from on outpatient, will leave up to primary pulmonologist.    Severe hyperglycemia   - incresed Lantus to 35u QHS   - ISS q4h  Hyperkalemia 30 kayaxelate x1 today   ID -continue IV abx as prescibed -follow up cultures  GI/Nutrition GI PROPHYLAXIS as indicated  DIET-->TF's as tolerated Constipation protocol as indicated  ENDO - ICU hypoglycemic\Hyperglycemia protocol -check FSBS per protocol   ELECTROLYTES -follow labs as needed -replace as needed -pharmacy consultation   DVT/GI PRX ordered -SCDs  TRANSFUSIONS AS NEEDED MONITOR FSBS ASSESS the need for LABS as needed   Critical care provider statement:    Critical care time (minutes):  33   Critical care  time was exclusive of:  Separately billable procedures and treating other patients   Critical care was necessary to treat or prevent imminent or life-threatening deterioration of the following conditions:   Acute on chronic hypoxemic respiratory failure, acute COPD exacerbation, atelectasis bilaterally, multiple comorbid conditions   Critical care was time spent personally by me on the following activities:  Development of treatment plan with patient or surrogate, discussions with consultants, evaluation of patient's response to treatment, examination of patient, obtaining history from patient or surrogate, ordering and performing treatments and interventions, ordering and review of laboratory studies and re-evaluation of patient's condition.  I assumed direction of critical care for this patient from another provider in my specialty: no    This document was prepared using Dragon voice recognition software and may include unintentional dictation errors.    Ottie Glazier, M.D.  Division of [place]

## 2019-06-29 ENCOUNTER — Inpatient Hospital Stay: Payer: Medicaid Other

## 2019-06-29 LAB — GLUCOSE, CAPILLARY
Glucose-Capillary: 220 mg/dL — ABNORMAL HIGH (ref 70–99)
Glucose-Capillary: 248 mg/dL — ABNORMAL HIGH (ref 70–99)
Glucose-Capillary: 253 mg/dL — ABNORMAL HIGH (ref 70–99)
Glucose-Capillary: 260 mg/dL — ABNORMAL HIGH (ref 70–99)
Glucose-Capillary: 297 mg/dL — ABNORMAL HIGH (ref 70–99)
Glucose-Capillary: 309 mg/dL — ABNORMAL HIGH (ref 70–99)
Glucose-Capillary: 322 mg/dL — ABNORMAL HIGH (ref 70–99)

## 2019-06-29 LAB — MAGNESIUM: Magnesium: 1.8 mg/dL (ref 1.7–2.4)

## 2019-06-29 LAB — CBC
HCT: 46.5 % — ABNORMAL HIGH (ref 36.0–46.0)
Hemoglobin: 14.3 g/dL (ref 12.0–15.0)
MCH: 25.7 pg — ABNORMAL LOW (ref 26.0–34.0)
MCHC: 30.8 g/dL (ref 30.0–36.0)
MCV: 83.6 fL (ref 80.0–100.0)
Platelets: 301 10*3/uL (ref 150–400)
RBC: 5.56 MIL/uL — ABNORMAL HIGH (ref 3.87–5.11)
RDW: 16.3 % — ABNORMAL HIGH (ref 11.5–15.5)
WBC: 11.4 10*3/uL — ABNORMAL HIGH (ref 4.0–10.5)
nRBC: 0 % (ref 0.0–0.2)

## 2019-06-29 LAB — BASIC METABOLIC PANEL
Anion gap: 9 (ref 5–15)
BUN: 17 mg/dL (ref 6–20)
CO2: 29 mmol/L (ref 22–32)
Calcium: 8.7 mg/dL — ABNORMAL LOW (ref 8.9–10.3)
Chloride: 103 mmol/L (ref 98–111)
Creatinine, Ser: 1 mg/dL (ref 0.44–1.00)
GFR calc Af Amer: >60 mL/min (ref >60)
GFR calc non Af Amer: >60 mL/min (ref >60)
Glucose, Bld: 300 mg/dL — ABNORMAL HIGH (ref 70–99)
Potassium: 3.8 mmol/L (ref 3.5–5.1)
Sodium: 141 mmol/L (ref 135–145)

## 2019-06-29 LAB — PROCALCITONIN: Procalcitonin: 0.4 ng/mL

## 2019-06-29 MED ORDER — QUETIAPINE FUMARATE 25 MG PO TABS
25.0000 mg | ORAL_TABLET | Freq: Every day | ORAL | Status: DC
  Administered 2019-06-29: 25 mg
  Filled 2019-06-29: qty 1

## 2019-06-29 MED ORDER — CLONAZEPAM 1 MG PO TABS
2.0000 mg | ORAL_TABLET | Freq: Once | ORAL | Status: AC
  Administered 2019-06-29: 12:00:00 2 mg via ORAL
  Filled 2019-06-29: qty 2

## 2019-06-29 MED ORDER — DEXMEDETOMIDINE HCL IN NACL 400 MCG/100ML IV SOLN
0.4000 ug/kg/h | INTRAVENOUS | Status: DC
  Administered 2019-06-29: 0.4 ug/kg/h via INTRAVENOUS

## 2019-06-29 MED ORDER — SODIUM CHLORIDE 0.9 % IV SOLN
1.0000 g | Freq: Three times a day (TID) | INTRAVENOUS | Status: DC
  Administered 2019-06-29 – 2019-06-30 (×3): 1 g via INTRAVENOUS
  Filled 2019-06-29 (×5): qty 1

## 2019-06-29 MED ORDER — IPRATROPIUM-ALBUTEROL 0.5-2.5 (3) MG/3ML IN SOLN
3.0000 mL | RESPIRATORY_TRACT | Status: DC
  Administered 2019-06-29 – 2019-07-05 (×38): 3 mL via RESPIRATORY_TRACT
  Filled 2019-06-29 (×38): qty 3

## 2019-06-29 MED ORDER — INSULIN ASPART 100 UNIT/ML ~~LOC~~ SOLN
4.0000 [IU] | Freq: Three times a day (TID) | SUBCUTANEOUS | Status: DC
  Administered 2019-06-29 – 2019-06-30 (×3): 4 [IU] via SUBCUTANEOUS
  Filled 2019-06-29 (×3): qty 1

## 2019-06-29 NOTE — Progress Notes (Signed)
CH called to rm. by ICU RN sec. b/c pt. experiencing a panic attack.  Pt. in bed on trach. ventilator and t/f had difficulty speaking.  Pt. appeared very anxious and wide-eyed; [place] assured pt. RN was on her way and provided ministry of presence.  Pt. took CH's hand and appeared slightly comforted by [facility] presence at bedside.  MD entered and spoke w/pt. about her needs for medication; placed order and helped calm pt.  Ch stayed w/pt. until RN came w/medication; offered a prayer for divine peace and presence.  CH remains available as needed.      06/29/19 1100  Clinical Encounter Type  Visited With Patient;Health care provider  Visit Type Initial;Spiritual support;Social support;Critical Care;Other (Comment) (Anxiety support)  Referral From Nurse  Consult/Referral To Chaplain  Spiritual Encounters  Spiritual Needs Prayer;Emotional  Stress Factors  Patient Stress Factors Other (Comment);Major life changes;Health changes (Panic attack)

## 2019-06-29 NOTE — Progress Notes (Signed)
PROGRESS NOTE    Michelle [name]  [id number] DOB: 10-09-1960 DOA: 06/24/2019 PCP: Frazier Richards, MD    Assessment & Plan:   Principal Problem:   COPD exacerbation [facility]) Active Problems:   Chronic respiratory failure requiring continuous mechanical ventilation through tracheostomy ([place])   CAD (coronary artery disease)   HTN (hypertension)   Chronic diastolic CHF (congestive heart failure) ([place])   New onset of type 2 diabetes mellitus in pediatric patient ([place])    Michelle [name] is a 59 y.o. female with medical history significant of COPD, CHF, hyperlipidemia, coronary artery disease, hypertension, hypercholesterolemia, status post tracheostomy, came with a chief complaint of mild shortness of breath with yellow productive sputum.  She is on 4 L of oxygen all the time.   # Acute on chronic respiratory failure with hypoxemia 2/2 # COPD exacerbation and bilateral atelectasis  --baseline 4L O2 --Pt presented with increased cough, sputum production, and wheezing PLAN: --DuoNebs, Pulmicort -Chest physiotherapy with MetaNeb --Titrate oxygen keep saturation more than 88% --continue steroid taper --continue Tobra neb BID per ICU  # CAP, ruled out --Started on IV Levaquin on admission.  Procal neg.  Trach aspirate grew pseudomonas today which is likely chronic colonization (past 2-3 aspirates cx all grew pseudomonas) --abx d/c'ed --continue Tobra neb BID per ICU  Hyperglycemia New onset diabetes mellitus type 2 Plan insulin sliding scale per sensitivity factor Hemoglobin A1c 11.8 --continue Lantus to 45u nightly --SSI q4h while on tube feeds  Hypokalemia and Hyperkalemia  Chronic pain medication Continue fentanyl and oxycodone  Hypercholesterolemia Zocor  Coronary artery disease Aspirin Zocor  Status post PEG tube Flushes normal saline every 6 hours  Dysphagia Per family patient can have soft diet  Anxiety and depression --continue home Remeron, paxil,  seroquel  --continue scheduled Klonopin and PRN IV ativan --Psych consult to help manage her anxiety   FEN: tube feed and soft diet DVT prophylaxis: Lovenox SQ Code Status: DNR  Family Communication: not today Disposition Plan: back home, undetermined when   Subjective and Interval History:  During rounds, pt said she was having a panic attack.  Chaplain was also at bedside.      Objective: Vitals:   06/29/19 1700 06/29/19 1800 06/29/19 1946 06/29/19 2000  BP: 97/71 (!) 85/68  137/81  Pulse: 94 91  96  Resp: 20 20  20   Temp:    98.9 F (37.2 C)  TempSrc:    Axillary  SpO2: 100% 100% 100% 100%  Weight:      Height:        Intake/Output Summary (Last 24 hours) at 06/29/2019 2100 Last data filed at 06/29/2019 1800 Gross per 24 hour  Intake 246.96 ml  Output 1050 ml  Net -803.04 ml   Filed Weights   06/24/19 1936 06/25/19 1343  Weight: 72.6 kg 89.5 kg    Examination:   Constitutional: In moderate distress due to panic attack, AAOx3 HEENT: conjunctivae and lids normal, EOMI, voice hoarse today and difficult to understand her speech CV: RRR no M,R,G. Distal pulses +2.  No cyanosis.   RESP: Diffuse Rhonchi, gurgling, trach collar on connected to suppl O2 GI: +BS, distended, PEG tube  Extremities: No effusions, edema, or tenderness in BLE SKIN: warm, dry and intact Neuro: II - XII grossly intact.  Sensation intact Psych: Depressed mood and affect.     Data Reviewed: I have personally reviewed following labs and imaging studies  CBC: Recent Labs  Lab 06/24/19 2057 06/24/19 2057 06/25/19 [id number]  06/26/19 0733 06/27/19 0450 06/28/19 0429 06/29/19 0419  WBC 4.8   < > 5.8 5.9 8.8 11.0* 11.4*  NEUTROABS 2.9  --   --   --   --  9.3*  --   HGB 13.1   < > 12.3 12.2 12.8 14.4 14.3  HCT 42.1   < > 38.5 38.8 41.9 46.9* 46.5*  MCV 81.0   < > 80.4 82.4 84.1 83.8 83.6  PLT 213   < > 229 189 195 279 301   < > = values in this interval not displayed.   Basic Metabolic  Panel: Recent Labs  Lab 06/25/19 0328 06/26/19 0733 06/27/19 0932 06/28/19 0429 06/29/19 0419  NA 137 138 144 140 141  K 2.9* 3.4* 4.7 5.2* 3.8  CL 101 110 115* 106 103  CO2 24 20* 21* 24 29  GLUCOSE 317* 283* 273* 334* 300*  BUN 7 8 7 12 17   CREATININE 0.75 0.87 0.93 1.05* 1.00  CALCIUM 8.4* 8.4* 8.6* 9.1 8.7*  MG 2.1 1.8 1.8 1.6* 1.8   GFR: Estimated Creatinine Clearance: 61.1 mL/min (by C-G formula based on SCr of 1 mg/dL). Liver Function Tests: Recent Labs  Lab 06/25/19 0328  AST 41  ALT 23  ALKPHOS 121  BILITOT 0.7  PROT 7.5  ALBUMIN 3.6   No results for input(s): LIPASE, AMYLASE in the last 168 hours. No results for input(s): AMMONIA in the last 168 hours. Coagulation Profile: No results for input(s): INR, PROTIME in the last 168 hours. Cardiac Enzymes: No results for input(s): CKTOTAL, CKMB, CKMBINDEX, TROPONINI in the last 168 hours. BNP (last 3 results) No results for input(s): PROBNP in the last 8760 hours. HbA1C: No results for input(s): HGBA1C in the last 72 hours. CBG: Recent Labs  Lab 06/29/19 0425 06/29/19 0723 06/29/19 1428 06/29/19 1620 06/29/19 1940  GLUCAP 297* 253* 220* 322* 309*   Lipid Profile: No results for input(s): CHOL, HDL, LDLCALC, TRIG, CHOLHDL, LDLDIRECT in the last 72 hours. Thyroid Function Tests: No results for input(s): TSH, T4TOTAL, FREET4, T3FREE, THYROIDAB in the last 72 hours. Anemia Panel: No results for input(s): VITAMINB12, FOLATE, FERRITIN, TIBC, IRON, RETICCTPCT in the last 72 hours. Sepsis Labs: Recent Labs  Lab 06/26/19 0733 06/27/19 1630 06/27/19 1849 06/29/19 0419  PROCALCITON <0.10  --   --  0.40  LATICACIDVEN  --  2.4* 2.3*  --     Recent Results (from the past 240 hour(s))  Culture, respiratory     Status: None   Collection Time: 06/24/19  7:44 PM   Specimen: Tracheal Aspirate  Result Value Ref Range Status   Specimen Description   Final    TRACHEAL ASPIRATE Performed at [facility], [address] [place], [place] [address]    Special Requests   Final    NONE Performed at [facility], [place], [place] [address]    Gram Stain   Final    RARE WBC PRESENT,BOTH PMN AND MONONUCLEAR ABUNDANT GRAM NEGATIVE RODS RARE GRAM POSITIVE RODS RARE GRAM POSITIVE COCCI IN PAIRS RARE BUDDING YEAST SEEN Performed at [facility], [place] [address] [place], [place] [address]    Culture ABUNDANT PSEUDOMONAS AERUGINOSA  Final   Report Status 06/27/2019 FINAL  Final   Organism ID, Bacteria PSEUDOMONAS AERUGINOSA  Final      Susceptibility   Pseudomonas aeruginosa - MIC*    CEFTAZIDIME 4 SENSITIVE Sensitive     CIPROFLOXACIN >=4 RESISTANT Resistant  GENTAMICIN 8 INTERMEDIATE Intermediate     IMIPENEM 2 SENSITIVE Sensitive     PIP/TAZO 8 SENSITIVE Sensitive     * ABUNDANT PSEUDOMONAS AERUGINOSA  SARS CORONAVIRUS 2 (TAT 6-24 HRS) Nasopharyngeal Nasopharyngeal Swab     Status: None   Collection Time: 06/24/19  7:45 PM   Specimen: Nasopharyngeal Swab  Result Value Ref Range Status   SARS Coronavirus 2 NEGATIVE NEGATIVE Final    Comment: (NOTE) SARS-CoV-2 target nucleic acids are NOT DETECTED. The SARS-CoV-2 RNA is generally detectable in upper and lower respiratory specimens during the acute phase of infection. Negative results do not preclude SARS-CoV-2 infection, do not rule out co-infections with other pathogens, and should not be used as the sole basis for treatment or other patient management decisions. Negative results must be combined with clinical observations, patient history, and epidemiological information. The expected result is Negative. Fact Sheet for Patients: SugarRoll.be Fact Sheet for Healthcare Providers: https://www.woods-mathews.com/ This test is not yet approved or cleared by the [place] FDA and  has been authorized for detection and/or diagnosis of SARS-CoV-2 by FDA  under an Emergency Use Authorization (EUA). This EUA will remain  in effect (meaning this test can be used) for the duration of the COVID-19 declaration under Section 56 4(b)(1) of the Act, 21 U.S.C. section 360bbb-3(b)(1), unless the authorization is terminated or revoked sooner. Performed at [facility], [place] [address] [place], [place] [address]   MRSA PCR Screening     Status: Abnormal   Collection Time: 06/25/19  1:40 PM   Specimen: Nasal Mucosa; Nasopharyngeal  Result Value Ref Range Status   MRSA by PCR POSITIVE (A) NEGATIVE Final    Comment:        The GeneXpert MRSA Assay (FDA approved for NASAL specimens only), is one component of a comprehensive MRSA colonization surveillance program. It is not intended to diagnose MRSA infection nor to guide or monitor treatment for MRSA infections. RESULT CALLED TO, READ BACK BY AND VERIFIED WITH: Adalberto Ill 06/25/19 at 1516. CST Performed at [facility], [address] [place], [place] [address]   Culture, respiratory (non-expectorated)     Status: None (Preliminary result)   Collection Time: 06/27/19  8:53 PM   Specimen: Tracheal Aspirate; Respiratory  Result Value Ref Range Status   Specimen Description   Final    TRACHEAL ASPIRATE Performed at [facility], [address] [place], [place] [address]    Special Requests   Final    NONE Performed at [facility], [address] [place] [place], [place] [address]    Gram Stain   Final    ABUNDANT WBC PRESENT, PREDOMINANTLY PMN ABUNDANT GRAM NEGATIVE RODS RARE GRAM POSITIVE RODS RARE YEAST RARE GRAM POSITIVE COCCI    Culture   Final    ABUNDANT PSEUDOMONAS AERUGINOSA SUSCEPTIBILITIES TO FOLLOW CULTURE REINCUBATED FOR BETTER GROWTH Performed at [facility], [place] [address] [place], [place] [address]    Report Status PENDING  Incomplete      Radiology Studies: No results found.   Scheduled Meds: . aspirin  81 mg Oral Daily    . budesonide  0.25 mg Nebulization BID  . Chlorhexidine Gluconate Cloth  6 each Topical Daily  . clonazePAM  2 mg Oral BID  . cloNIDine  0.1 mg Transdermal Weekly  . enoxaparin (LOVENOX) injection  40 mg Subcutaneous Q24H  . famotidine  20 mg Per Tube Daily  . fentaNYL  1 patch Transdermal Q72H  .  fluticasone  1 spray Each Nare Daily  . insulin aspart  0-15 Units Subcutaneous Q4H  . insulin aspart  4 Units Subcutaneous TID WC  . insulin glargine  45 Units Subcutaneous QHS  . ipratropium-albuterol  3 mL Nebulization Q4H  . living well with diabetes book   Does not apply Once  . loratadine  10 mg Oral Daily  . mirtazapine  30 mg Oral QHS  . mupirocin ointment  1 application Nasal BID  . PARoxetine  20 mg Per Tube Daily  . pneumococcal 23 valent vaccine  0.5 mL Intramuscular Tomorrow-1000  . predniSONE  30 mg Oral Q breakfast  . QUEtiapine  50 mg Per Tube QHS  . senna  1-2 tablet Per Tube QHS  . simvastatin  40 mg Oral QPM  . sodium chloride flush  3 mL Intravenous Q12H  . sodium chloride irrigation  50 mL Per Tube Q6H  . tobramycin (PF)  300 mg Nebulization BID   Continuous Infusions: . sodium chloride    . sodium chloride Stopped (06/28/19 0532)  . cefTAZidime (FORTAZ)  IV Stopped (06/29/19 1740)     LOS: 5 days     Enzo Bi, MD [facility] If 7PM-7AM, please contact night-coverage 06/29/2019, 9:00 PM

## 2019-06-29 NOTE — Progress Notes (Signed)
Inpatient Diabetes Program Recommendations  AACE/ADA: New Consensus Statement on Inpatient Glycemic Control (2015)  Target Ranges:  Prepandial:   less than 140 mg/dL      Peak postprandial:   less than 180 mg/dL (1-2 hours)      Critically ill patients:  140 - 180 mg/dL   Lab Results  Component Value Date   GLUCAP 253 (H) 06/29/2019   HGBA1C 11.8 (H) 06/25/2019    Review of Glycemic Control  Results for ANABELEN, WINEBARGER (MRN [id number]) as of 06/29/2019 13:54  Ref. Range 06/28/2019 23:51 06/29/2019 03:47 06/29/2019 04:19 06/29/2019 04:25 06/29/2019 07:23  Glucose-Capillary Latest Ref Range: 70 - 99 mg/dL 355 (H) 260 (H)  297 (H) 253 (H)    Diabetes history: DM2 Outpatient Diabetes medications: None Current orders for Inpatient glycemic control: Lantus 45 units daily + Novolog 0-15 units Q4H + Prednisone 30 mg daily   Inpatient Diabetes Program Recommendations:     Noted that Lantus was increased to 45 units last night  -Please consider Novolog 4 units TID with meals if eats at least 50% of meals  Thank you, Reche Dixon, RN, BSN Diabetes Coordinator Inpatient Diabetes Program [phone] (team pager from 8a-5p)

## 2019-06-30 DIAGNOSIS — J441 Chronic obstructive pulmonary disease with (acute) exacerbation: Secondary | ICD-10-CM

## 2019-06-30 DIAGNOSIS — Z930 Tracheostomy status: Secondary | ICD-10-CM

## 2019-06-30 DIAGNOSIS — Z9911 Dependence on respirator [ventilator] status: Secondary | ICD-10-CM

## 2019-06-30 DIAGNOSIS — J9610 Chronic respiratory failure, unspecified whether with hypoxia or hypercapnia: Secondary | ICD-10-CM

## 2019-06-30 LAB — CBC
HCT: 43.3 % (ref 36.0–46.0)
Hemoglobin: 13.3 g/dL (ref 12.0–15.0)
MCH: 25.7 pg — ABNORMAL LOW (ref 26.0–34.0)
MCHC: 30.7 g/dL (ref 30.0–36.0)
MCV: 83.8 fL (ref 80.0–100.0)
Platelets: 320 10*3/uL (ref 150–400)
RBC: 5.17 MIL/uL — ABNORMAL HIGH (ref 3.87–5.11)
RDW: 16.4 % — ABNORMAL HIGH (ref 11.5–15.5)
WBC: 11.6 10*3/uL — ABNORMAL HIGH (ref 4.0–10.5)
nRBC: 0 % (ref 0.0–0.2)

## 2019-06-30 LAB — GLUCOSE, CAPILLARY
Glucose-Capillary: 219 mg/dL — ABNORMAL HIGH (ref 70–99)
Glucose-Capillary: 225 mg/dL — ABNORMAL HIGH (ref 70–99)
Glucose-Capillary: 246 mg/dL — ABNORMAL HIGH (ref 70–99)
Glucose-Capillary: 355 mg/dL — ABNORMAL HIGH (ref 70–99)
Glucose-Capillary: 258 mg/dL — ABNORMAL HIGH (ref 70–99)
Glucose-Capillary: 269 mg/dL — ABNORMAL HIGH (ref 70–99)

## 2019-06-30 LAB — MAGNESIUM: Magnesium: 1.6 mg/dL — ABNORMAL LOW (ref 1.7–2.4)

## 2019-06-30 LAB — BASIC METABOLIC PANEL
Anion gap: 13 (ref 5–15)
BUN: 29 mg/dL — ABNORMAL HIGH (ref 6–20)
CO2: 25 mmol/L (ref 22–32)
Calcium: 8.5 mg/dL — ABNORMAL LOW (ref 8.9–10.3)
Chloride: 101 mmol/L (ref 98–111)
Creatinine, Ser: 1.68 mg/dL — ABNORMAL HIGH (ref 0.44–1.00)
GFR calc Af Amer: 38 mL/min — ABNORMAL LOW (ref >60)
GFR calc non Af Amer: 33 mL/min — ABNORMAL LOW (ref >60)
Glucose, Bld: 222 mg/dL — ABNORMAL HIGH (ref 70–99)
Potassium: 2.9 mmol/L — ABNORMAL LOW (ref 3.5–5.1)
Sodium: 139 mmol/L (ref 135–145)

## 2019-06-30 LAB — PROCALCITONIN: Procalcitonin: 0.92 ng/mL

## 2019-06-30 MED ORDER — MAGNESIUM SULFATE 2 GM/50ML IV SOLN
2.0000 g | Freq: Once | INTRAVENOUS | Status: AC
  Administered 2019-06-30: 2 g via INTRAVENOUS
  Filled 2019-06-30: qty 50

## 2019-06-30 MED ORDER — INSULIN ASPART 100 UNIT/ML ~~LOC~~ SOLN
6.0000 [IU] | Freq: Three times a day (TID) | SUBCUTANEOUS | Status: DC
  Administered 2019-06-30 – 2019-07-01 (×2): 6 [IU] via SUBCUTANEOUS
  Filled 2019-06-30 (×2): qty 1

## 2019-06-30 MED ORDER — MELATONIN 5 MG PO TABS
5.0000 mg | ORAL_TABLET | ORAL | Status: DC
  Administered 2019-06-30 – 2019-07-04 (×5): 5 mg via ORAL
  Filled 2019-06-30 (×5): qty 1

## 2019-06-30 MED ORDER — CLONAZEPAM 1 MG PO TABS
1.5000 mg | ORAL_TABLET | Freq: Three times a day (TID) | ORAL | Status: DC
  Administered 2019-06-30 – 2019-07-05 (×16): 1.5 mg via ORAL
  Filled 2019-06-30 (×16): qty 1

## 2019-06-30 MED ORDER — SODIUM CHLORIDE 0.9 % IV SOLN
2.0000 g | Freq: Two times a day (BID) | INTRAVENOUS | Status: DC
  Administered 2019-06-30: 17:00:00 2 g via INTRAVENOUS
  Filled 2019-06-30 (×2): qty 2

## 2019-06-30 MED ORDER — CLONAZEPAM 1 MG PO TABS: 2.0000 mg | ORAL_TABLET | Freq: Three times a day (TID) | ORAL | Status: DC

## 2019-06-30 MED ORDER — INSULIN ASPART 100 UNIT/ML ~~LOC~~ SOLN
0.0000 [IU] | Freq: Every day | SUBCUTANEOUS | Status: DC
  Administered 2019-06-30: 3 [IU] via SUBCUTANEOUS
  Filled 2019-06-30: qty 1

## 2019-06-30 MED ORDER — QUETIAPINE FUMARATE 25 MG PO TABS
100.0000 mg | ORAL_TABLET | Freq: Every day | ORAL | Status: DC
  Administered 2019-06-30 – 2019-07-04 (×5): 100 mg
  Filled 2019-06-30 (×5): qty 4

## 2019-06-30 MED ORDER — INSULIN ASPART 100 UNIT/ML ~~LOC~~ SOLN
0.0000 [IU] | Freq: Three times a day (TID) | SUBCUTANEOUS | Status: DC
  Administered 2019-06-30: 15 [IU] via SUBCUTANEOUS
  Administered 2019-07-01: 8 [IU] via SUBCUTANEOUS
  Filled 2019-06-30 (×3): qty 1

## 2019-06-30 MED ORDER — INSULIN GLARGINE 100 UNIT/ML ~~LOC~~ SOLN
50.0000 [IU] | Freq: Every day | SUBCUTANEOUS | Status: DC
  Administered 2019-06-30: 50 [IU] via SUBCUTANEOUS
  Filled 2019-06-30 (×2): qty 0.5

## 2019-06-30 MED ORDER — QUETIAPINE FUMARATE 25 MG PO TABS: 75.0000 mg | ORAL_TABLET | Freq: Every day | ORAL | Status: DC

## 2019-06-30 MED ORDER — POTASSIUM CHLORIDE 20 MEQ/15ML (10%) PO SOLN
40.0000 meq | ORAL | Status: DC
  Filled 2019-06-30 (×2): qty 30

## 2019-06-30 MED ORDER — POTASSIUM CHLORIDE 10 MEQ/100ML IV SOLN
10.0000 meq | INTRAVENOUS | Status: AC
  Administered 2019-06-30 (×4): 10 meq via INTRAVENOUS
  Filled 2019-06-30 (×4): qty 100

## 2019-06-30 MED ORDER — POTASSIUM CHLORIDE 10 MEQ/100ML IV SOLN
10.0000 meq | INTRAVENOUS | Status: AC
  Administered 2019-06-30 (×4): 10 meq via INTRAVENOUS
  Filled 2019-06-30 (×4): qty 100

## 2019-06-30 MED ORDER — SODIUM CHLORIDE 0.9 % IV SOLN
250.0000 mL | INTRAVENOUS | Status: DC
  Administered 2019-06-30: 02:00:00 250 mL via INTRAVENOUS

## 2019-06-30 MED ORDER — OXYCODONE HCL 5 MG PO TABS
5.0000 mg | ORAL_TABLET | ORAL | Status: DC
  Administered 2019-06-30 – 2019-07-04 (×5): 5 mg via ORAL
  Filled 2019-06-30 (×5): qty 1

## 2019-06-30 MED ORDER — NOREPINEPHRINE 4 MG/250ML-% IV SOLN
2.0000 ug/min | INTRAVENOUS | Status: DC
  Administered 2019-06-30: 02:00:00 2 ug/min via INTRAVENOUS
  Filled 2019-06-30: qty 250

## 2019-06-30 NOTE — Consult Note (Signed)
PHARMACY CONSULT NOTE - FOLLOW UP  Pharmacy Consult for Electrolyte Monitoring and Replacement   Recent Labs: Potassium (mmol/L)  Date Value  06/30/2019 2.9 (L)  07/11/2014 4.6   Magnesium (mg/dL)  Date Value  06/30/2019 1.6 (L)  10/31/2013 2.6 (H)   Calcium (mg/dL)  Date Value  06/30/2019 8.5 (L)   Calcium, Total (mg/dL)  Date Value  07/11/2014 8.9   Albumin (g/dL)  Date Value  06/25/2019 3.6  07/09/2014 3.7   Phosphorus (mg/dL)  Date Value  03/14/2019 4.4  10/31/2013 4.6   Sodium (mmol/L)  Date Value  06/30/2019 139  07/11/2014 139     Assessment: Pharmacy has been consulted to manage electrolytes for this 59 year-old female admitted with end stage COPD exacerbation and ventilator dependent chronic hypercapnic respiratory failure.  PMH is significant for COPD, CHF, CAD, hyperlipidemia, hypertension, and tracheostomy.  Patient is currently refusing PO medications.  Electrolytes Goals of therapy given cardiac history: K ~4 Mag ~2 All other electrolytes WNL. Patient given KCl 10 mEq q1 hour x 3 doses.  Will give KCl 10 mEq q1 hour x 4 doses. Will give magnesium sulfate 2 g x 2 doses for a total daily dose of 4 g.  Will order electrolytes with AM labs.  Glucose Hemoglobin A1c 11.8.  SBG trending in the 200-300s on prednisone.  Patient currently receiving moderate SSI q4 hours, Novolog 4 units TIDWM, and Lantus 45 units QHS.  Will follow diabetes coordinator recommendation, and increase to Novolog 6 units TIDWM.  Constipation Last BM was 1/24.  Patient currently receiving scheduled oxycodone and PRN percocet.  Current orders include docusate PRN, miralax PRN, and scheduled Senna.  Will follow up tomorrow.   Gerald Dexter ,PharmD 06/30/2019 3:19 PM

## 2019-06-30 NOTE — Progress Notes (Signed)
Pharmacy Antibiotic Note  Michelle [name] is a 59 y.o. female admitted on 06/24/2019 with mild SOB and yellow productive sputum. Pt had COPD exacerbation and acute on chronic respiratory failure with hypoxemia. PMH includes end stage COPD, CHF, HLD, CAD, HTN, hypercholesterolemia, s/p tracheostomy. Respiratory cultures (tracheal aspirate) collected on 01/20 and 01/23 indicate growth of P. aeruginosa. Pt was started on TOBI on 01/24. Per am rounds, discussions were made to D/C TOBI today. Based on sensitivities, ceftazidime provides good coverage against P. aeruginosa. Pharmacy has been consulted for Ceftazidime dosing.  Plan: D/C TOBI nebulizer 300mg  BID Ceftazidime 2g Q12H starting today; dosing was adjusted for renal function (CrCl 36.4 mL/min) Total duration of antibiotic therapy so far: 3 days Total duration of therapy overall: 7 days. Then reassess with cultures to see if there is a longer antibiotic duration need.   Height: 5' (152.4 cm) Weight: 197 lb 5 oz (89.5 kg) IBW/kg (Calculated) : 45.5  Temp (24hrs), Avg:99 F (37.2 C), Min:98.8 F (37.1 C), Max:99.1 F (37.3 C)  Recent Labs  Lab 06/26/19 0733 06/27/19 0450 06/27/19 0932 06/27/19 1630 06/27/19 1849 06/28/19 0429 06/29/19 0419 06/30/19 0523  WBC 5.9 8.8  --   --   --  11.0* 11.4* 11.6*  CREATININE 0.87  --  0.93  --   --  1.05* 1.00 1.68*  LATICACIDVEN  --   --   --  2.4* 2.3*  --   --   --     Estimated Creatinine Clearance: 36.4 mL/min (A) (by C-G formula based on SCr of 1.68 mg/dL (H)).    Allergies  Allergen Reactions  . Amoxicillin     Patient has tolerated cephalosporins in the past    Antimicrobials this admission: Tobramycin 300mg  (TOBI) 01/24 >> 01/25 Ceftazidime 1g Q8H 01/25 >> 01/26 Ceftazidime 2g Q12H 01/26 >> 02/01  Dose adjustments this admission: 01/26 Ceftazidime adjusted to 2g Q12hr.   Microbiology results:  01/20 Respiratory Culture (tracheal aspirate): abundant p. Aeruginosa, finalized   01/23 Respiratory Culture: abundant p. Aeruginosa, pending   01/21 MRSA PCR: Positive 01/20 SARS Coronavirus: Negative   Thank you for allowing pharmacy to be a part of this patient's care.  Roanna Banning, PharmD Candidate 06/30/2019 11:53 AM

## 2019-06-30 NOTE — Consult Note (Signed)
[facility] Face-to-Face Psychiatry Consult   Reason for Consult: Anxiety Referring Physician: Dr. 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 female patient admitted with acute on chronic respiratory failure. HPI:  Interview limited due to patient's clinical condition of tracheostomy and current level of sedation.  Patient with acute on chronic respiratory failure.  She endorses anxiety and panic attacks that were established even prior to her worsening of her clinical care.  She reports feeling increased anxiety while in the hospital as well as occasional panic attacks.  She is agreeable to changes in the regimen to decrease her anxiety.  Past Psychiatric History: Patient has a history of anxiety and depression has been taking medications and is compliant with them.  Risk to Self:  Female Risk to Others:   Prior Inpatient Therapy:  No Prior Outpatient Therapy:  Yes  Past Medical History:  Past Medical History:  Diagnosis Date  . Allergy   . Anxiety   . Asthma   . CHF (congestive heart failure) ([place])   . Cocaine abuse ([place])   . COPD (chronic obstructive pulmonary disease) ([place])   . Coronary artery disease   . Emphysema/COPD ([place])   . Hepatitis C   . High cholesterol   . Hypertension   . Pneumothorax   . Tobacco abuse     Past Surgical History:  Procedure Laterality Date  . CARDIAC CATHETERIZATION    . CHEST TUBE INSERTION    . IR GASTROSTOMY TUBE MOD SED  10/31/2017  . TRACHEOSTOMY  TUBE PLACEMENT N/A 10/17/2017   Procedure: TRACHEOSTOMY;  Surgeon: Carloyn Manner, MD;  Location: [facility] ORS;  Service: ENT;  Laterality: N/A;   Family History:  Family History  Problem Relation Age of Onset  . Lung cancer Mother   . Asthma Mother   . CVA Father   . CAD Father   . Stroke Father   . Breast cancer Maternal Aunt 67  . COPD Sister    Family Psychiatric  History: Unknown Social History:  Social History   Substance and Sexual Activity  Alcohol Use No     Social History   Substance and Sexual Activity  Drug Use Yes  . Types: "Crack" cocaine   Comment: Past cocaine use.  Quit 04/2017    Social History   Socioeconomic History  . Marital status: Single    Spouse name: Not on file  . Number of children: Not on file  . Years of education: Not on file  . Highest education level: Not on file  Occupational History  . Not on file  Tobacco Use  . Smoking status: Former Smoker    Packs/day: 0.10    Years: 41.00    Pack years: 4.10    Types: Cigarettes    Quit date: 06/09/2017    Years since quitting: 2.0  . Smokeless tobacco: Never Used  Substance and Sexual Activity  . Alcohol use: No  . Drug use: Yes    Types: "Crack" cocaine    Comment: Past cocaine use.  Quit  04/2017  . Sexual activity: Not Currently    Partners: Male    Birth control/protection: Post-menopausal  Other Topics Concern  . Not on file  Social History Narrative  . Not on file   Social Determinants of Health   Financial Resource Strain:   . Difficulty of Paying Living Expenses: Not on file  Food Insecurity:   . Worried About Charity fundraiser in the Last Year: Not on file  . Ran Out of Food in the Last Year: Not on file  Transportation Needs:   . Lack of Transportation (Medical): Not on file  . Lack of Transportation (Non-Medical): Not on file  Physical Activity:   . Days of Exercise per Week: Not on file  . Minutes of Exercise per Session: Not on file  Stress:   . Feeling of  Stress : Not on file  Social Connections:   . Frequency of Communication with Friends and Family: Not on file  . Frequency of Social Gatherings with Friends and Family: Not on file  . Attends Religious Services: Not on file  . Active Member of Clubs or Organizations: Not on file  . Attends Archivist Meetings: Not on file  . Marital Status: Not on file   Additional Social History:    Allergies:   Allergies  Allergen Reactions  . Amoxicillin     Patient has tolerated cephalosporins in the past    Labs:  Results for orders placed or performed during the hospital encounter of 06/24/19 (from the past 48 hour(s))  Glucose, capillary     Status: Abnormal   Collection Time: 06/28/19  4:02 PM  Result Value Ref Range   Glucose-Capillary 323 (H) 70 - 99 mg/dL  Glucose, capillary     Status: Abnormal   Collection Time: 06/28/19  7:16 PM  Result Value Ref Range   Glucose-Capillary 319 (H) 70 - 99 mg/dL  Glucose, capillary     Status: Abnormal   Collection Time: 06/28/19 11:51 PM  Result Value Ref Range   Glucose-Capillary 355 (H) 70 - 99 mg/dL  Glucose, capillary     Status: Abnormal   Collection Time: 06/29/19  3:47 AM  Result Value Ref Range   Glucose-Capillary 260 (H) 70 - 99 mg/dL  Basic metabolic panel     Status: Abnormal   Collection Time: 06/29/19  4:19 AM  Result Value Ref Range   Sodium 141 135 - 145 mmol/L   Potassium 3.8 3.5 - 5.1 mmol/L   Chloride 103 98 - 111 mmol/L   CO2 29 22 - 32 mmol/L   Glucose, Bld 300 (H) 70 - 99 mg/dL   BUN 17 6 - 20 mg/dL   Creatinine, Ser 1.00 0.44 - 1.00 mg/dL   Calcium 8.7 (L) 8.9 - 10.3 mg/dL   GFR calc non Af Amer >60 >60 mL/min   GFR calc Af Amer >60 >60 mL/min   Anion gap 9 5 - 15    Comment: Performed at [facility], [address] [place], [place] [address]  CBC     Status: Abnormal   Collection Time: 06/29/19  4:19 AM  Result Value Ref Range   WBC 11.4 (H) 4.0 - 10.5 K/uL   RBC 5.56 (H) 3.87 - 5.11  MIL/uL   Hemoglobin 14.3 12.0 - 15.0 g/dL   HCT 46.5 (H) 36.0 - 46.0 %   MCV 83.6 80.0 - 100.0 fL   MCH 25.7 (L) 26.0 - 34.0 pg   MCHC 30.8  30.0 - 36.0 g/dL   RDW 16.3 (H) 11.5 - 15.5 %   Platelets 301 150 - 400 K/uL   nRBC 0.0 0.0 - 0.2 %    Comment: Performed at [facility], [address] [place], [place] [address]  Magnesium     Status: None   Collection Time: 06/29/19  4:19 AM  Result Value Ref Range   Magnesium 1.8 1.7 - 2.4 mg/dL    Comment: Performed at [facility], [address] [place], [place] [address]  Procalcitonin - Baseline     Status: None   Collection Time: 06/29/19  4:19 AM  Result Value Ref Range   Procalcitonin 0.40 ng/mL    Comment:        Interpretation: PCT (Procalcitonin) <= 0.5 ng/mL: Systemic infection (sepsis) is not likely. Local bacterial infection is possible. (NOTE)       Sepsis PCT Algorithm           Lower Respiratory Tract                                      Infection PCT Algorithm    ----------------------------     ----------------------------         PCT < 0.25 ng/mL                PCT < 0.10 ng/mL         Strongly encourage             Strongly discourage   discontinuation of antibiotics    initiation of antibiotics    ----------------------------     -----------------------------       PCT 0.25 - 0.50 ng/mL            PCT 0.10 - 0.25 ng/mL               OR       >80% decrease in PCT            Discourage initiation of                                            antibiotics      Encourage discontinuation           of antibiotics    ----------------------------     -----------------------------         PCT >= 0.50 ng/mL              PCT 0.26 - 0.50 ng/mL               AND        <80% decrease in PCT             Encourage initiation of                                             antibiotics       Encourage continuation           of antibiotics    ----------------------------     -----------------------------         PCT >= 0.50 ng/mL  PCT > 0.50 ng/mL               AND         increase in PCT                  Strongly encourage                                      initiation of antibiotics    Strongly encourage escalation           of antibiotics                                     -----------------------------                                           PCT <= 0.25 ng/mL                                                 OR                                        > 80% decrease in PCT                                     Discontinue / Do not initiate                                             antibiotics Performed at [facility], [address] [place], [place] [address]   Glucose, capillary     Status: Abnormal   Collection Time: 06/29/19  4:25 AM  Result Value Ref Range   Glucose-Capillary 297 (H) 70 - 99 mg/dL  Glucose, capillary     Status: Abnormal   Collection Time: 06/29/19  7:23 AM  Result Value Ref Range   Glucose-Capillary 253 (H) 70 - 99 mg/dL   Comment 1 Notify RN    Comment 2 Document in Chart   Glucose, capillary     Status: Abnormal   Collection Time: 06/29/19  2:28 PM  Result Value Ref Range   Glucose-Capillary 220 (H) 70 - 99 mg/dL   Comment 1 Notify RN    Comment 2 Document in Chart   Glucose, capillary     Status: Abnormal   Collection Time: 06/29/19  4:20 PM  Result Value Ref Range   Glucose-Capillary 322 (H) 70 - 99 mg/dL  Glucose, capillary     Status: Abnormal   Collection Time: 06/29/19  7:40 PM  Result Value Ref Range   Glucose-Capillary 309 (H) 70 - 99 mg/dL  Glucose, capillary     Status: Abnormal   Collection Time: 06/29/19 11:34 PM  Result Value Ref Range   Glucose-Capillary 248 (H) 70 - 99 mg/dL  Glucose, capillary  Status: Abnormal   Collection Time: 06/30/19  3:54 AM  Result Value Ref Range   Glucose-Capillary 219 (H) 70 - 99 mg/dL  Basic metabolic panel     Status: Abnormal   Collection Time: 06/30/19  5:23 AM   Result Value Ref Range   Sodium 139 135 - 145 mmol/L   Potassium 2.9 (L) 3.5 - 5.1 mmol/L   Chloride 101 98 - 111 mmol/L   CO2 25 22 - 32 mmol/L   Glucose, Bld 222 (H) 70 - 99 mg/dL   BUN 29 (H) 6 - 20 mg/dL   Creatinine, Ser 1.68 (H) 0.44 - 1.00 mg/dL   Calcium 8.5 (L) 8.9 - 10.3 mg/dL   GFR calc non Af Amer 33 (L) >60 mL/min   GFR calc Af Amer 38 (L) >60 mL/min   Anion gap 13 5 - 15    Comment: Performed at [facility], [address] [place], [place] [address]  CBC     Status: Abnormal   Collection Time: 06/30/19  5:23 AM  Result Value Ref Range   WBC 11.6 (H) 4.0 - 10.5 K/uL   RBC 5.17 (H) 3.87 - 5.11 MIL/uL   Hemoglobin 13.3 12.0 - 15.0 g/dL   HCT 43.3 36.0 - 46.0 %   MCV 83.8 80.0 - 100.0 fL   MCH 25.7 (L) 26.0 - 34.0 pg   MCHC 30.7 30.0 - 36.0 g/dL   RDW 16.4 (H) 11.5 - 15.5 %   Platelets 320 150 - 400 K/uL   nRBC 0.0 0.0 - 0.2 %    Comment: Performed at [facility], [address], [place], [place] [address]  Magnesium     Status: Abnormal   Collection Time: 06/30/19  5:23 AM  Result Value Ref Range   Magnesium 1.6 (L) 1.7 - 2.4 mg/dL    Comment: Performed at [facility], [address] [place], [place] [address]  Procalcitonin     Status: None   Collection Time: 06/30/19  5:23 AM  Result Value Ref Range   Procalcitonin 0.92 ng/mL    Comment:        Interpretation: PCT > 0.5 ng/mL and <= 2 ng/mL: Systemic infection (sepsis) is possible, but other conditions are known to elevate PCT as well. (NOTE)       Sepsis PCT Algorithm           Lower Respiratory Tract                                      Infection PCT Algorithm    ----------------------------     ----------------------------         PCT < 0.25 ng/mL                PCT < 0.10 ng/mL         Strongly encourage             Strongly discourage   discontinuation of antibiotics    initiation of antibiotics    ----------------------------     -----------------------------        PCT 0.25 - 0.50 ng/mL            PCT 0.10 - 0.25 ng/mL               OR       >80% decrease in PCT  Discourage initiation of                                            antibiotics      Encourage discontinuation           of antibiotics    ----------------------------     -----------------------------         PCT >= 0.50 ng/mL              PCT 0.26 - 0.50 ng/mL                AND       <80% decrease in PCT             Encourage initiation of                                             antibiotics       Encourage continuation           of antibiotics    ----------------------------     -----------------------------        PCT >= 0.50 ng/mL                  PCT > 0.50 ng/mL               AND         increase in PCT                  Strongly encourage                                      initiation of antibiotics    Strongly encourage escalation           of antibiotics                                     -----------------------------                                           PCT <= 0.25 ng/mL                                                 OR                                        > 80% decrease in PCT                                     Discontinue / Do not initiate  antibiotics Performed at [facility], [address] [place], [place] [address]   Glucose, capillary     Status: Abnormal   Collection Time: 06/30/19  8:09 AM  Result Value Ref Range   Glucose-Capillary 225 (H) 70 - 99 mg/dL  Glucose, capillary     Status: Abnormal   Collection Time: 06/30/19 11:23 AM  Result Value Ref Range   Glucose-Capillary 246 (H) 70 - 99 mg/dL    Current Facility-Administered Medications  Medication Dose Route Frequency Provider Last Rate Last Admin  . 0.9 %  sodium chloride infusion  250 mL Intravenous PRN Cristescu, Mircea G, MD      . 0.9 %  sodium chloride infusion  250 mL Intravenous Continuous Awilda Bill, NP    Stopped at 06/28/19 0532  . 0.9 %  sodium chloride infusion  250 mL Intravenous Continuous Awilda Bill, NP 10 mL/hr at 06/30/19 1000 Rate Verify at 06/30/19 1000  . alum & mag hydroxide-simeth (MAALOX/MYLANTA) 200-200-20 MG/5ML suspension 15 mL  15 mL Oral Q6H PRN Enzo Bi, MD      . aspirin chewable tablet 81 mg  81 mg Oral Daily Cristescu, Linard Millers, MD   81 mg at 06/30/19 0836  . budesonide (PULMICORT) nebulizer solution 0.25 mg  0.25 mg Nebulization BID Ottie Glazier, MD   0.25 mg at 06/30/19 0757  . calcium carbonate (TUMS - dosed in mg elemental calcium) chewable tablet 200 mg of elemental calcium  1 tablet Oral TID PRN Enzo Bi, MD      . cefTAZidime (FORTAZ) 2 g in sodium chloride 0.9 % 100 mL IVPB  2 g Intravenous Q12H Flora Lipps, MD      . Chlorhexidine Gluconate Cloth 2 % PADS 6 each  6 each Topical Daily Enzo Bi, MD   6 each at 06/28/19 1729  . clonazePAM (KLONOPIN) tablet 2 mg  2 mg Oral TID Flora Lipps, MD      . cloNIDine (CATAPRES - Dosed in mg/24 hr) patch 0.1 mg  0.1 mg Transdermal Weekly Awilda Bill, NP   0.1 mg at 06/27/19 2258  . docusate sodium (COLACE) capsule 100 mg  100 mg Oral BID PRN Enzo Bi, MD      . enoxaparin (LOVENOX) injection 40 mg  40 mg Subcutaneous Q24H Cristescu, Mircea G, MD   40 mg at 06/29/19 2050  . famotidine (PEPCID) tablet 20 mg  20 mg Per Tube Daily Cristescu, Linard Millers, MD   20 mg at 06/30/19 0837  . fentaNYL (DURAGESIC) 25 MCG/HR 1 patch  1 patch Transdermal Q72H Cristescu, Linard Millers, MD   1 patch at 06/27/19 2316  . fluticasone (FLONASE) 50 MCG/ACT nasal spray 1 spray  1 spray Each Nare Daily Cristescu, Linard Millers, MD   1 spray at 06/28/19 1031  . guaiFENesin-dextromethorphan (ROBITUSSIN DM) 100-10 MG/5ML syrup 10 mL  10 mL Oral Q6H PRN Enzo Bi, MD      . hydrALAZINE (APRESOLINE) injection 10-20 mg  10-20 mg Intravenous Q4H PRN Awilda Bill, NP   20 mg at 06/29/19 2125  . insulin aspart (novoLOG) injection 0-15 Units  0-15 Units  Subcutaneous Q4H Ottie Glazier, MD   5 Units at 06/30/19 0839  . insulin aspart (novoLOG) injection 4 Units  4 Units Subcutaneous TID WC Charlett Nose, RPH   4 Units at 06/30/19 0844  . insulin glargine (LANTUS) injection 45 Units  45 Units Subcutaneous QHS Enzo Bi, MD   45 Units at 06/29/19  2219  . ipratropium-albuterol (DUONEB) 0.5-2.5 (3) MG/3ML nebulizer solution 3 mL  3 mL Nebulization Q6H PRN Cristescu, Linard Millers, MD   3 mL at 06/29/19 0450  . ipratropium-albuterol (DUONEB) 0.5-2.5 (3) MG/3ML nebulizer solution 3 mL  3 mL Nebulization Q4H Flora Lipps, MD   3 mL at 06/30/19 1107  . living well with diabetes book MISC   Does not apply Once Enzo Bi, MD      . loratadine (CLARITIN) tablet 10 mg  10 mg Oral Daily Cristescu, Linard Millers, MD   10 mg at 06/30/19 0836  . LORazepam (ATIVAN) injection 1 mg  1 mg Intravenous Q4H PRN Ottie Glazier, MD   1 mg at 06/29/19 2005  . magnesium sulfate IVPB 2 g 50 mL  2 g Intravenous Once Charlett Nose, RPH      . Melatonin TABS 5 mg  5 mg Oral Q24H Flora Lipps, MD      . mirtazapine (REMERON SOL-TAB) disintegrating tablet 30 mg  30 mg Oral QHS Ottie Glazier, MD   30 mg at 06/29/19 2047  . ondansetron (ZOFRAN) injection 4 mg  4 mg Intravenous Q6H PRN Enzo Bi, MD   4 mg at 06/29/19 2155  . ondansetron (ZOFRAN-ODT) disintegrating tablet 4 mg  4 mg Oral Q8H PRN Enzo Bi, MD      . oxyCODONE (Oxy IR/ROXICODONE) immediate release tablet 5 mg  5 mg Oral Q24H Flora Lipps, MD      . oxyCODONE-acetaminophen (PERCOCET/ROXICET) 5-325 MG per tablet 1 tablet  1 tablet Oral Q8H PRN Cristescu, Linard Millers, MD   1 tablet at 06/29/19 2047  . PARoxetine (PAXIL) tablet 20 mg  20 mg Per Tube Daily Cristescu, Linard Millers, MD   20 mg at 06/30/19 0841  . pneumococcal 23 valent vaccine (PNEUMOVAX-23) injection 0.5 mL  0.5 mL Intramuscular Tomorrow-1000 Enzo Bi, MD      . polyethylene glycol (MIRALAX / GLYCOLAX) packet 17 g  17 g Oral BID PRN Enzo Bi, MD      .  potassium chloride 10 mEq in 100 mL IVPB  10 mEq Intravenous Q1 Hr x 4 Charlett Nose, RPH 100 mL/hr at 06/30/19 1049 10 mEq at 06/30/19 1049  . predniSONE (DELTASONE) tablet 30 mg  30 mg Oral Q breakfast Ottie Glazier, MD   30 mg at 06/30/19 0837  . QUEtiapine (SEROQUEL) tablet 75 mg  75 mg Per Tube QHS Flora Lipps, MD      . senna (SENOKOT) tablet 8.6-17.2 mg  1-2 tablet Per Tube QHS Cristescu, Mircea G, MD      . simvastatin (ZOCOR) tablet 40 mg  40 mg Oral QPM Cristescu, Mircea G, MD   40 mg at 06/29/19 1705  . sodium chloride flush (NS) 0.9 % injection 3 mL  3 mL Intravenous Q12H Cristescu, Linard Millers, MD   3 mL at 06/29/19 2049  . sodium chloride flush (NS) 0.9 % injection 3 mL  3 mL Intravenous PRN Cristescu, Mircea G, MD      . sodium chloride irrigation 0.9 % 50 mL  50 mL Per Tube Q6H Cristescu, Linard Millers, MD   50 mL at 06/30/19 0439     Psychiatric Specialty Exam: Physical Exam  Review of Systems  Psychiatric/Behavioral: Negative for dysphoric mood and suicidal ideas. The patient is nervous/anxious.     Blood pressure 102/65, pulse 92, temperature 98.8 F (37.1 C), temperature source Axillary, resp. rate 20, height 5' (1.524 m), weight 89.5 kg, last menstrual  period 03/06/2005, SpO2 96 %.Body mass index is 38.53 kg/m.  General Appearance: Obese female laying in bed with tracheostomy  Eye Contact:  Good  Speech:  Slow  Volume:  Decreased  Mood:  Anxious  Affect:  Congruent  Thought Process:  Coherent  Orientation:  Full (Time, Place, and Person)  Thought Content:  Logical  Suicidal Thoughts:  No  Homicidal Thoughts:  No  Memory:  Recent;   Fair  Judgement:  Fair  Insight:  Fair  Psychomotor Activity:  Normal  Concentration:  Concentration: Fair  Recall:  [employer] of Knowledge:  Fair  Language:  Fair  Akathisia:  No  Handed:  Right  AIMS (if indicated):     Assets:  Desire for Improvement  ADL's:  Impaired  Cognition:  WNL  Sleep:        Treatment Plan  Summary: 59 year old patient with significant anxiety and panic attacks.  Would benefit from medication changes while in the hospital to this increased anxiety.  Medications:  We will change Klonopin 2 mg twice daily to Klonopin 1.5 mg 3 times daily  We will increase Seroquel to 100 mg p.o. nightly    Diagnosis: GAD  Disposition: Supportive therapy provided about ongoing stressors. Discussed crisis plan, support from social network, calling 911, coming to the Emergency Department, and calling Suicide Hotline.  Dixie Dials, MD 06/30/2019 12:00 PM

## 2019-06-30 NOTE — Progress Notes (Addendum)
CRITICAL CARE PROGRESS NOTE    Name: Michelle [name] MRN: [id number] DOB: 05/31/1961     LOS: 6   SUBJECTIVE FINDINGS & SIGNIFICANT EVENTS   Patient description:   HPI: Michelle [name] is a 59 y.o. female with medical history significant of end stage COPD, CHF, hyperlipidemia, coronary artery disease, hypertension, hypercholesterolemia, status post tracheostomy, came with a chief complaint of mild shortness of breath with yellow productive sputum.  She is on 4 L of oxygen all the time. In the emergency room in mild respiratory distressWith chest x-ray small lower lobe infiltrate, potassium 3.0, blood sugar 563, normal anion gap normal BUN and creatinine BNP 42 troponin 20,, normal white count, hemoglobin 13.1 VENT DEPENDANT CHRONIC HYPERCAPNIC RESP FAILURE     Lines / Drains: PIVx2  Cultures / Sepsis markers: respiratory  Antibiotics: Levaquin   Protocols / Consultants: Hospitalist and critical care  06/26/19-patient had another episode of resp distress overnight 06/27/19- patient clinically improved, serial lavages via tracheal catheter to aspirate mucus plugging done by RT today, patient feels better.  06/28/19- off precedex infusion, patient with pseudomonas colonization however repeated bouts of respiratory distress and readmissions, will start TOBI bid today, plan to d/c home soon if continues to stay off precedex 1/25 during the day-did nOT tolerate home vent Very agitated, precedex infusion started   CC Follow up resp failure  HPI On home vent this AM Severe agitation On precedex   PAST MEDICAL HISTORY   Past Medical History:  Diagnosis Date  . Allergy   . Anxiety   . Asthma   . CHF (congestive heart failure) ([place])   . Cocaine abuse ([place])   . COPD (chronic obstructive pulmonary disease)  ([place])   . Coronary artery disease   . Emphysema/COPD ([place])   . Hepatitis C   . High cholesterol   . Hypertension   . Pneumothorax   . Tobacco abuse      SURGICAL HISTORY   Past Surgical History:  Procedure Laterality Date  . CARDIAC CATHETERIZATION    . CHEST TUBE INSERTION    . IR GASTROSTOMY TUBE MOD SED  10/31/2017  . TRACHEOSTOMY TUBE PLACEMENT N/A 10/17/2017   Procedure: TRACHEOSTOMY;  Surgeon: Carloyn Manner, MD;  Location: [facility] ORS;  Service: ENT;  Laterality: N/A;     FAMILY HISTORY   Family History  Problem Relation Age of Onset  . Lung cancer Mother   . Asthma Mother   . CVA Father   . CAD Father   . Stroke Father   . Breast cancer Maternal Aunt 67  . COPD Sister      SOCIAL HISTORY   Social History   Tobacco Use  . Smoking status: Former Smoker    Packs/day: 0.10    Years: 41.00    Pack years: 4.10    Types: Cigarettes    Quit date: 06/09/2017    Years since quitting: 2.0  . Smokeless tobacco: Never Used  Substance Use Topics  . Alcohol use: No  . Drug use: Yes    Types: "Crack" cocaine    Comment: Past cocaine use.  Quit 04/2017     MEDICATIONS   Current Medication:  Current Facility-Administered Medications:  .  0.9 %  sodium chloride infusion, 250 mL, Intravenous, PRN, Cristescu, Mircea G, MD .  0.9 %  sodium chloride infusion, 250 mL, Intravenous, Continuous, Blakeney, Dreama Saa, NP, Stopped at 06/28/19 0532 .  0.9 %  sodium chloride infusion, 250 mL, Intravenous, Continuous, Blakeney,  Dreama Saa, NP, Stopped at 06/30/19 [id number] .  alum & mag hydroxide-simeth (MAALOX/MYLANTA) 200-200-20 MG/5ML suspension 15 mL, 15 mL, Oral, Q6H PRN, Enzo Bi, MD .  aspirin chewable tablet 81 mg, 81 mg, Oral, Daily, Cristescu, Mircea G, MD, 81 mg at 06/29/19 1038 .  budesonide (PULMICORT) nebulizer solution 0.25 mg, 0.25 mg, Nebulization, BID, Lanney Gins, Fuad, MD, 0.25 mg at 06/29/19 1945 .  calcium carbonate (TUMS - dosed in mg elemental calcium) chewable  tablet 200 mg of elemental calcium, 1 tablet, Oral, TID PRN, Enzo Bi, MD .  cefTAZidime (FORTAZ) 1 g in sodium chloride 0.9 % 100 mL IVPB, 1 g, Intravenous, Q8H, Julicia Krieger, MD, Last Rate: 200 mL/hr at 06/30/19 0600, Rate Verify at 06/30/19 0600 .  Chlorhexidine Gluconate Cloth 2 % PADS 6 each, 6 each, Topical, Daily, Enzo Bi, MD, 6 each at 06/28/19 1729 .  clonazePAM (KLONOPIN) tablet 2 mg, 2 mg, Oral, BID, Cristescu, Mircea G, MD, 2 mg at 06/29/19 2047 .  cloNIDine (CATAPRES - Dosed in mg/24 hr) patch 0.1 mg, 0.1 mg, Transdermal, Weekly, Awilda Bill, NP, 0.1 mg at 06/27/19 2258 .  dexmedetomidine (PRECEDEX) 400 MCG/100ML (4 mcg/mL) infusion, 0.4-1.2 mcg/kg/hr, Intravenous, Titrated, Blakeney, Dana G, NP, Last Rate: 4.48 mL/hr at 06/30/19 0600, 0.2 mcg/kg/hr at 06/30/19 0600 .  docusate sodium (COLACE) capsule 100 mg, 100 mg, Oral, BID PRN, Enzo Bi, MD .  enoxaparin (LOVENOX) injection 40 mg, 40 mg, Subcutaneous, Q24H, Cristescu, Mircea G, MD, 40 mg at 06/29/19 2050 .  famotidine (PEPCID) tablet 20 mg, 20 mg, Per Tube, Daily, Cristescu, Mircea G, MD, 20 mg at 06/29/19 1038 .  fentaNYL (DURAGESIC) 25 MCG/HR 1 patch, 1 patch, Transdermal, Q72H, Cristescu, Linard Millers, MD, 1 patch at 06/27/19 2316 .  fluticasone (FLONASE) 50 MCG/ACT nasal spray 1 spray, 1 spray, Each Nare, Daily, Cristescu, Mircea G, MD, 1 spray at 06/28/19 1031 .  guaiFENesin-dextromethorphan (ROBITUSSIN DM) 100-10 MG/5ML syrup 10 mL, 10 mL, Oral, Q6H PRN, Enzo Bi, MD .  hydrALAZINE (APRESOLINE) injection 10-20 mg, 10-20 mg, Intravenous, Q4H PRN, Awilda Bill, NP, 20 mg at 06/29/19 2125 .  insulin aspart (novoLOG) injection 0-15 Units, 0-15 Units, Subcutaneous, Q4H, Ottie Glazier, MD, 5 Units at 06/30/19 0437 .  insulin aspart (novoLOG) injection 4 Units, 4 Units, Subcutaneous, TID WC, Charlett Nose, RPH, 4 Units at 06/29/19 1705 .  insulin glargine (LANTUS) injection 45 Units, 45 Units, Subcutaneous, QHS, Enzo Bi, MD, 45 Units at 06/29/19 2219 .  ipratropium-albuterol (DUONEB) 0.5-2.5 (3) MG/3ML nebulizer solution 3 mL, 3 mL, Nebulization, Q6H PRN, Cristescu, Mircea G, MD, 3 mL at 06/29/19 0450 .  ipratropium-albuterol (DUONEB) 0.5-2.5 (3) MG/3ML nebulizer solution 3 mL, 3 mL, Nebulization, Q4H, Lillion Elbert, MD, 3 mL at 06/30/19 0406 .  living well with diabetes book MISC, , Does not apply, Once, Enzo Bi, MD .  loratadine (CLARITIN) tablet 10 mg, 10 mg, Oral, Daily, Cristescu, Mircea G, MD, 10 mg at 06/29/19 1038 .  LORazepam (ATIVAN) injection 1 mg, 1 mg, Intravenous, Q4H PRN, Ottie Glazier, MD, 1 mg at 06/29/19 2005 .  magnesium sulfate IVPB 2 g 50 mL, 2 g, Intravenous, Once, Awilda Bill, NP .  mirtazapine (REMERON SOL-TAB) disintegrating tablet 30 mg, 30 mg, Oral, QHS, Aleskerov, Fuad, MD, 30 mg at 06/29/19 2047 .  mupirocin ointment (BACTROBAN) 2 % 1 application, 1 application, Nasal, BID, Enzo Bi, MD, 1 application at [id number] .  norepinephrine (LEVOPHED) 4mg  in 256mL premix infusion, 2-10  mcg/min, Intravenous, Titrated, Awilda Bill, NP, Last Rate: 11.25 mL/hr at 06/30/19 0600, 3 mcg/min at 06/30/19 0600 .  ondansetron (ZOFRAN) injection 4 mg, 4 mg, Intravenous, Q6H PRN, Enzo Bi, MD, 4 mg at 06/29/19 2155 .  ondansetron (ZOFRAN-ODT) disintegrating tablet 4 mg, 4 mg, Oral, Q8H PRN, Enzo Bi, MD .  oxyCODONE-acetaminophen (PERCOCET/ROXICET) 5-325 MG per tablet 1 tablet, 1 tablet, Oral, Q8H PRN, Cristescu, Linard Millers, MD, 1 tablet at 06/29/19 2047 .  PARoxetine (PAXIL) tablet 20 mg, 20 mg, Per Tube, Daily, Cristescu, Mircea G, MD, 20 mg at 06/29/19 1038 .  pneumococcal 23 valent vaccine (PNEUMOVAX-23) injection 0.5 mL, 0.5 mL, Intramuscular, Tomorrow-1000, Enzo Bi, MD .  polyethylene glycol (MIRALAX / GLYCOLAX) packet 17 g, 17 g, Oral, BID PRN, Enzo Bi, MD .  potassium chloride 20 MEQ/15ML (10%) solution 40 mEq, 40 mEq, Per Tube, Q4H, Blakeney, Dreama Saa, NP .  predniSONE  (DELTASONE) tablet 30 mg, 30 mg, Oral, Q breakfast, Ottie Glazier, MD, 30 mg at 06/29/19 0810 .  QUEtiapine (SEROQUEL) tablet 25 mg, 25 mg, Per Tube, QHS, Awilda Bill, NP, 25 mg at 06/29/19 2201 .  QUEtiapine (SEROQUEL) tablet 50 mg, 50 mg, Per Tube, QHS, Awilda Bill, NP, 50 mg at 06/29/19 2047 .  senna (SENOKOT) tablet 8.6-17.2 mg, 1-2 tablet, Per Tube, QHS, Cristescu, Mircea G, MD .  simvastatin (ZOCOR) tablet 40 mg, 40 mg, Oral, QPM, Cristescu, Mircea G, MD, 40 mg at 06/29/19 1705 .  sodium chloride flush (NS) 0.9 % injection 3 mL, 3 mL, Intravenous, Q12H, Cristescu, Mircea G, MD, 3 mL at 06/29/19 2049 .  sodium chloride flush (NS) 0.9 % injection 3 mL, 3 mL, Intravenous, PRN, Cristescu, Mircea G, MD .  sodium chloride irrigation 0.9 % 50 mL, 50 mL, Per Tube, Q6H, Cristescu, Mircea G, MD, 50 mL at 06/30/19 0439 .  tobramycin (PF) (TOBI) nebulizer solution 300 mg, 300 mg, Nebulization, BID, Lanney Gins, Fuad, MD, 300 mg at 06/29/19 1959    ALLERGIES   Amoxicillin    REVIEW OF SYSTEMS     10 point ROS is negative except as per subjective findings  PHYSICAL EXAMINATION   Vital Signs: Temp:  [98.4 F (36.9 C)-99.1 F (37.3 C)] 98.8 F (37.1 C) (01/26 0400) Pulse Rate:  [68-152] 83 (01/26 0630) Resp:  [17-26] 20 (01/26 0630) BP: (53-196)/(40-118) 86/63 (01/26 0630) SpO2:  [95 %-100 %] 95 % (01/26 0630) FiO2 (%):  [28 %-32 %] 32 % (01/26 0407)  PHYSICAL EXAMINATION:  GENERAL: ill appearing HEAD: Normocephalic, atraumatic.  EYES: Pupils equal, round, reactive to light.  No scleral icterus.  MOUTH: Moist mucosal membrane. NECK: Supple. No thyromegaly. No nodules. No JVD.  PULMONARY: +rhonchi, +wheezing CARDIOVASCULAR: S1 and S2. Regular rate and rhythm. No murmurs, rubs, or gallops.  GASTROINTESTINAL: Soft, nontender, -distended. Positive bowel sounds.  MUSCULOSKELETAL: No swelling, clubbing, or edema.  NEUROLOGIC:agitated SKIN:intact,warm,dry    PERTINENT  DATA     Infusions: . sodium chloride    . sodium chloride Stopped (06/28/19 0532)  . sodium chloride Stopped (06/30/19 0538)  . cefTAZidime (FORTAZ)  IV 200 mL/hr at 06/30/19 0600  . dexmedetomidine (PRECEDEX) IV infusion 0.2 mcg/kg/hr (06/30/19 0600)  . magnesium sulfate bolus IVPB    . norepinephrine (LEVOPHED) Adult infusion 3 mcg/min (06/30/19 0600)   Scheduled Medications: . aspirin  81 mg Oral Daily  . budesonide  0.25 mg Nebulization BID  . Chlorhexidine Gluconate Cloth  6 each Topical Daily  . clonazePAM  2 mg  Oral BID  . cloNIDine  0.1 mg Transdermal Weekly  . enoxaparin (LOVENOX) injection  40 mg Subcutaneous Q24H  . famotidine  20 mg Per Tube Daily  . fentaNYL  1 patch Transdermal Q72H  . fluticasone  1 spray Each Nare Daily  . insulin aspart  0-15 Units Subcutaneous Q4H  . insulin aspart  4 Units Subcutaneous TID WC  . insulin glargine  45 Units Subcutaneous QHS  . ipratropium-albuterol  3 mL Nebulization Q4H  . living well with diabetes book   Does not apply Once  . loratadine  10 mg Oral Daily  . mirtazapine  30 mg Oral QHS  . mupirocin ointment  1 application Nasal BID  . PARoxetine  20 mg Per Tube Daily  . pneumococcal 23 valent vaccine  0.5 mL Intramuscular Tomorrow-1000  . potassium chloride  40 mEq Per Tube Q4H  . predniSONE  30 mg Oral Q breakfast  . QUEtiapine  25 mg Per Tube QHS  . QUEtiapine  50 mg Per Tube QHS  . senna  1-2 tablet Per Tube QHS  . simvastatin  40 mg Oral QPM  . sodium chloride flush  3 mL Intravenous Q12H  . sodium chloride irrigation  50 mL Per Tube Q6H  . tobramycin (PF)  300 mg Nebulization BID   PRN Medications: sodium chloride, alum & mag hydroxide-simeth, calcium carbonate, docusate sodium, guaiFENesin-dextromethorphan, hydrALAZINE, ipratropium-albuterol, LORazepam, ondansetron (ZOFRAN) IV, ondansetron, oxyCODONE-acetaminophen, polyethylene glycol, sodium chloride flush Hemodynamic parameters:   Intake/Output: 01/25 0701 -  01/26 0700 In: 466.7 [I.V.:194.7; IV Piggyback:272] Out: 500 [Urine:500]  Ventilator  Settings: Vent Mode: AC FiO2 (%):  [28 %-32 %] 32 % Set Rate:  [18 bmp-20 bmp] 20 bmp Vt Set:  [450 mL-460 mL] 450 mL PEEP:  [5 cmH20] 5 cmH20    LAB RESULTS:  Basic Metabolic Panel: Recent Labs  Lab 06/26/19 0733 06/26/19 0733 06/27/19 0932 06/27/19 0932 06/28/19 0429 06/28/19 0429 06/29/19 0419 06/30/19 0523  NA 138  --  144  --  140  --  141 139  K 3.4*   < > 4.7   < > 5.2*   < > 3.8 2.9*  CL 110  --  115*  --  106  --  103 101  CO2 20*  --  21*  --  24  --  29 25  GLUCOSE 283*  --  273*  --  334*  --  300* 222*  BUN 8  --  7  --  12  --  17 29*  CREATININE 0.87  --  0.93  --  1.05*  --  1.00 1.68*  CALCIUM 8.4*  --  8.6*  --  9.1  --  8.7* 8.5*  MG 1.8  --  1.8  --  1.6*  --  1.8 1.6*   < > = values in this interval not displayed.   Liver Function Tests: Recent Labs  Lab 06/25/19 0328  AST 41  ALT 23  ALKPHOS 121  BILITOT 0.7  PROT 7.5  ALBUMIN 3.6   No results for input(s): LIPASE, AMYLASE in the last 168 hours. No results for input(s): AMMONIA in the last 168 hours. CBC: Recent Labs  Lab 06/24/19 2057 06/25/19 0328 06/26/19 0733 06/27/19 0450 06/28/19 0429 06/29/19 0419 06/30/19 0523  WBC 4.8   < > 5.9 8.8 11.0* 11.4* 11.6*  NEUTROABS 2.9  --   --   --  9.3*  --   --   HGB 13.1   < >  12.2 12.8 14.4 14.3 13.3  HCT 42.1   < > 38.8 41.9 46.9* 46.5* 43.3  MCV 81.0   < > 82.4 84.1 83.8 83.6 83.8  PLT 213   < > 189 195 279 301 320   < > = values in this interval not displayed.   Cardiac Enzymes: No results for input(s): CKTOTAL, CKMB, CKMBINDEX, TROPONINI in the last 168 hours. BNP: Invalid input(s): POCBNP CBG: Recent Labs  Lab 06/29/19 1428 06/29/19 1620 06/29/19 1940 06/29/19 2334 06/30/19 0354  GLUCAP 220* 322* 309* 248* 219*     IMAGING RESULTS:  Imaging: No results found.    ASSESSMENT AND PLAN     Acute Hypoxic Respiratory  Failure SEVERE COPD EXACERBATION -continue IV steroids as prescribed -continue NEB THERAPY as prescribed -morphine as needed -wean fio2 as needed and tolerated -chronic pseudomonas + respiratory cultures, we have stopped her IV antibiotics but I will start TOBI bid    ENDO - ICU hypoglycemic\Hyperglycemia protocol -check FSBS per protocol   INFECTIOUS DISEASE -continue antibiotics as prescribed -follow up cultures  SEVERE ANXIETY WILL REVIEW MEDS WITH ICU TEAM  GI GI PROPHYLAXIS as indicated  NUTRITIONAL STATUS DIET-->patient eats by mouth as requested by patient Constipation protocol as indicated  ELECTROLYTES -follow labs as needed -replace as needed -pharmacy consultation and following    DVT/GI PRX ordered TRANSFUSIONS AS NEEDED MONITOR FSBS ASSESS the need for LABS as needed    STEP DOWN STATUS    Corrin Parker, M.D.  [facility]  Medical Director [place] Director [place] Department

## 2019-06-30 NOTE — Progress Notes (Signed)
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 female with medical history significant of COPD, CHF, hyperlipidemia, coronary artery disease, hypertension, hypercholesterolemia, status post tracheostomy, came with a chief complaint of mild shortness of breath with yellow productive sputum.  She is on 4 L of oxygen all the time.   # Acute on chronic respiratory failure with hypoxemia 2/2 # COPD exacerbation and bilateral atelectasis  --baseline 4L O2 --Michelle [name] presented with increased cough, sputum production, and wheezing PLAN: --DuoNebs, Pulmicort --Chest physiotherapy with MetaNeb --Titrate oxygen keep saturation more than 88% --continue steroid taper --continue Tobra neb BID per ICU  # CAP, ruled out --Started on IV Levaquin on admission.  Procal neg.  Trach aspirate grew pseudomonas today which is likely chronic colonization (past 2-3 aspirates cx all grew pseudomonas) --abx d/c'ed --continue Tobra neb BID per ICU  Hyperglycemia New onset diabetes mellitus type 2 Plan insulin sliding scale per sensitivity factor Hemoglobin A1c 11.8 --continue Lantus to 45u nightly --SSI q4h while on tube feeds  Hypokalemia and Hyperkalemia  Chronic pain medication Continue fentanyl and oxycodone  Hypercholesterolemia Zocor  Coronary artery disease Aspirin Zocor  Status post PEG tube Flushes normal saline every 6 hours  Dysphagia Per family patient can have soft diet  Anxiety and depression --continue home Remeron,  paxil --Psych consult today to help manage her anxiety --change Klonopin 2 mg twice daily to Klonopin 1.5 mg 3 times daily, per psych rec --increase Seroquel to 100 mg p.o. nightly   FEN: tube feed and soft diet DVT prophylaxis: Lovenox SQ Code Status: DNR  Family Communication: not today Disposition Plan: back home, undetermined when   Subjective and Interval History:  Michelle [name] reported feeling much better today, no panic attack, better mood, and improved breathing.  Was taking in some oral foods/drinks.  No fever, N/V/D.    Objective: Vitals:   06/30/19 1600 06/30/19 1700 06/30/19 1800 06/30/19 1900  BP: (!) 149/79 (!) 171/94 (!) 178/93 (!) 168/98  Pulse: 98 99 99 92  Resp: (!) 23 (!) 22 (!) 23 20  Temp: 98.5 F (36.9 C)     TempSrc: Oral     SpO2: 98% 98% 100% 98%  Weight:      Height:        Intake/Output Summary (Last 24 hours) at 06/30/2019 2043 Last data filed at 06/30/2019 1800 Gross per 24 hour  Intake 809.3 ml  Output 1300 ml  Net -490.7 ml   Filed Weights   06/24/19 1936 06/25/19 1343  Weight: 72.6 kg 89.5 kg    Examination:   Constitutional: NAD, AAOx3, much brighter today, smiling actually HEENT: conjunctivae and lids normal, EOMI, voice hoarse still but can speak louder CV: RRR no M,R,G. Distal pulses +2.  No cyanosis.   RESP: breath sounds more clear, trach collar on connected to suppl O2 GI: +BS, PEG tube  Extremities: No effusions, edema, or tenderness in BLE SKIN: warm, dry and intact Neuro: II - XII grossly intact.  Sensation intact Psych: better mood and affect.  Data Reviewed: I have personally reviewed following labs and imaging studies  CBC: Recent Labs  Lab 06/24/19 2057 06/25/19 0328 06/26/19 0733 06/27/19 0450 06/28/19 0429 06/29/19 0419 06/30/19 0523  WBC 4.8   < > 5.9 8.8 11.0* 11.4* 11.6*  NEUTROABS 2.9  --   --   --  9.3*  --   --   HGB 13.1   < > 12.2 12.8 14.4 14.3 13.3  HCT 42.1   < > 38.8 41.9 46.9* 46.5* 43.3  MCV  81.0   < > 82.4 84.1 83.8 83.6 83.8  PLT 213   < > 189 195 279 301 320   < > = values in this interval not displayed.   Basic Metabolic Panel: Recent Labs  Lab 06/26/19 0733 06/27/19 0932 06/28/19 0429 06/29/19 0419 06/30/19 0523  NA 138 144 140 141 139  K 3.4* 4.7 5.2* 3.8 2.9*  CL 110 115* 106 103 101  CO2 20* 21* 24 29 25   GLUCOSE 283* 273* 334* 300* 222*  BUN 8 7 12 17  29*  CREATININE 0.87 0.93 1.05* 1.00 1.68*  CALCIUM 8.4* 8.6* 9.1 8.7* 8.5*  MG 1.8 1.8 1.6* 1.8 1.6*   GFR: Estimated Creatinine Clearance: 36.4 mL/min (A) (by C-G formula based on SCr of 1.68 mg/dL (H)). Liver Function Tests: Recent Labs  Lab 06/25/19 0328  AST 41  ALT 23  ALKPHOS 121  BILITOT 0.7  PROT 7.5  ALBUMIN 3.6   No results for input(s): LIPASE, AMYLASE in the last 168 hours. No results for input(s): AMMONIA in the last 168 hours. Coagulation Profile: No results for input(s): INR, PROTIME in the last 168 hours. Cardiac Enzymes: No results for input(s): CKTOTAL, CKMB, CKMBINDEX, TROPONINI in the last 168 hours. BNP (last 3 results) No results for input(s): PROBNP in the last 8760 hours. HbA1C: No results for input(s): HGBA1C in the last 72 hours. CBG: Recent Labs  Lab 06/30/19 0354 06/30/19 0809 06/30/19 1123 06/30/19 1605 06/30/19 2013  GLUCAP 219* 225* 246* 355* 258*   Lipid Profile: No results for input(s): CHOL, HDL, LDLCALC, TRIG, CHOLHDL, LDLDIRECT in the last 72 hours. Thyroid Function Tests: No results for input(s): TSH, T4TOTAL, FREET4, T3FREE, THYROIDAB in the last 72 hours. Anemia Panel: No results for input(s): VITAMINB12, FOLATE, FERRITIN, TIBC, IRON, RETICCTPCT in the last 72 hours. Sepsis Labs: Recent Labs  Lab 06/26/19 0733 06/27/19 1630 06/27/19 1849 06/29/19 0419 06/30/19 0523  PROCALCITON <0.10  --   --  0.40 0.92  LATICACIDVEN  --  2.4* 2.3*  --   --     Recent Results (from the past 240 hour(s))  Culture, respiratory     Status: None    Collection Time: 06/24/19  7:44 PM   Specimen: Tracheal Aspirate  Result Value Ref Range Status   Specimen Description   Final    TRACHEAL ASPIRATE Performed at [facility], [address], [place], [place] [address]    Special Requests   Final    NONE Performed at [facility], [place], [place] [address]    Gram Stain   Final    RARE WBC PRESENT,BOTH PMN AND MONONUCLEAR ABUNDANT GRAM NEGATIVE RODS RARE GRAM POSITIVE RODS RARE GRAM POSITIVE COCCI IN PAIRS RARE BUDDING YEAST SEEN Performed at [facility], [place] [address] [place], [place] [address]    Culture ABUNDANT PSEUDOMONAS AERUGINOSA  Final   Report Status 06/27/2019 FINAL  Final   Organism ID, Bacteria PSEUDOMONAS AERUGINOSA  Final      Susceptibility   Pseudomonas aeruginosa - MIC*    CEFTAZIDIME 4 SENSITIVE Sensitive     CIPROFLOXACIN >=4 RESISTANT Resistant     GENTAMICIN 8 INTERMEDIATE Intermediate     IMIPENEM 2 SENSITIVE Sensitive     PIP/TAZO 8 SENSITIVE Sensitive     * ABUNDANT PSEUDOMONAS AERUGINOSA  SARS CORONAVIRUS 2 (TAT 6-24 HRS) Nasopharyngeal Nasopharyngeal Swab     Status: None   Collection Time: 06/24/19  7:45 PM   Specimen: Nasopharyngeal Swab  Result Value Ref Range Status   SARS Coronavirus 2 NEGATIVE NEGATIVE Final    Comment: (NOTE) SARS-CoV-2 target nucleic acids are NOT DETECTED. The SARS-CoV-2 RNA is generally detectable in upper and lower respiratory specimens during the acute phase of infection. Negative results do not preclude SARS-CoV-2 infection, do not rule out co-infections with other pathogens, and should not be used as the sole basis for treatment or other patient management decisions. Negative results must be combined with clinical observations, patient history, and epidemiological information. The expected result is Negative. Fact Sheet for Patients: SugarRoll.be Fact Sheet for Healthcare  Providers: https://www.woods-mathews.com/ This test is not yet approved or cleared by the [place] FDA and  has been authorized for detection and/or diagnosis of SARS-CoV-2 by FDA under an Emergency Use Authorization (EUA). This EUA will remain  in effect (meaning this test can be used) for the duration of the COVID-19 declaration under Section 56 4(b)(1) of the Act, 21 U.S.C. section 360bbb-3(b)(1), unless the authorization is terminated or revoked sooner. Performed at [facility], [place] [address] [place], [place], [place] [address]   MRSA PCR Screening     Status: Abnormal   Collection Time: 06/25/19  1:40 PM   Specimen: Nasal Mucosa; Nasopharyngeal  Result Value Ref Range Status   MRSA by PCR POSITIVE (A) NEGATIVE Final    Comment:        The GeneXpert MRSA Assay (FDA approved for NASAL specimens only), is one component of a comprehensive MRSA colonization surveillance program. It is not intended to diagnose MRSA infection nor to guide or monitor treatment for MRSA infections. RESULT CALLED TO, READ BACK BY AND VERIFIED WITH: Adalberto Ill 06/25/19 at 1516. CST Performed at [facility], [place] [place], [place] [address]   Culture, respiratory (non-expectorated)     Status: None (Preliminary result)   Collection Time: 06/27/19  8:53 PM   Specimen: Tracheal Aspirate; Respiratory  Result Value Ref Range Status   Specimen Description   Final    TRACHEAL ASPIRATE Performed at [facility], [address] [place], [place] [address]    Special Requests   Final    NONE Performed at [facility], [place] [place], [place] [address]    Gram Stain   Final    ABUNDANT WBC PRESENT, PREDOMINANTLY PMN ABUNDANT GRAM NEGATIVE RODS RARE GRAM POSITIVE RODS RARE YEAST RARE GRAM POSITIVE COCCI    Culture   Final    ABUNDANT PSEUDOMONAS AERUGINOSA ABUNDANT SERRATIA MARCESCENS SUSCEPTIBILITIES TO FOLLOW Performed at [facility], [address] [place],  [address]    Report Status PENDING  Incomplete   Organism ID, Bacteria PSEUDOMONAS AERUGINOSA  Final      Susceptibility   Pseudomonas aeruginosa - MIC*    CEFTAZIDIME 8 SENSITIVE Sensitive     CIPROFLOXACIN >=4 RESISTANT Resistant     GENTAMICIN <=1 SENSITIVE Sensitive     IMIPENEM 2 SENSITIVE Sensitive  PIP/TAZO 8 SENSITIVE Sensitive     * ABUNDANT PSEUDOMONAS AERUGINOSA      Radiology Studies: No results found.   Scheduled Meds: . aspirin  81 mg Oral Daily  . budesonide  0.25 mg Nebulization BID  . Chlorhexidine Gluconate Cloth  6 each Topical Daily  . clonazePAM  1.5 mg Oral TID  . cloNIDine  0.1 mg Transdermal Weekly  . enoxaparin (LOVENOX) injection  40 mg Subcutaneous Q24H  . famotidine  20 mg Per Tube Daily  . fentaNYL  1 patch Transdermal Q72H  . fluticasone  1 spray Each Nare Daily  . insulin aspart  0-15 Units Subcutaneous TID WC  . insulin aspart  0-5 Units Subcutaneous QHS  . insulin aspart  6 Units Subcutaneous TID WC  . insulin glargine  45 Units Subcutaneous QHS  . ipratropium-albuterol  3 mL Nebulization Q4H  . living well with diabetes book   Does not apply Once  . loratadine  10 mg Oral Daily  . Melatonin  5 mg Oral Q24H  . mirtazapine  30 mg Oral QHS  . oxyCODONE  5 mg Oral Q24H  . PARoxetine  20 mg Per Tube Daily  . pneumococcal 23 valent vaccine  0.5 mL Intramuscular Tomorrow-1000  . predniSONE  30 mg Oral Q breakfast  . QUEtiapine  100 mg Per Tube QHS  . senna  1-2 tablet Per Tube QHS  . simvastatin  40 mg Oral QPM  . sodium chloride flush  3 mL Intravenous Q12H  . sodium chloride irrigation  50 mL Per Tube Q6H   Continuous Infusions: . sodium chloride    . sodium chloride Stopped (06/28/19 0532)  . sodium chloride Stopped (06/30/19 1048)  . cefTAZidime (FORTAZ)  IV Stopped (06/30/19 1744)  . potassium chloride 10 mEq (06/30/19 2007)     LOS: 6 days     Enzo Bi, MD [facility] If 7PM-7AM, please contact night-coverage 06/30/2019, 8:43 PM

## 2019-06-30 NOTE — Progress Notes (Signed)
Inpatient Diabetes Program Recommendations  AACE/ADA: New Consensus Statement on Inpatient Glycemic Control (2015)  Target Ranges:  Prepandial:   less than 140 mg/dL      Peak postprandial:   less than 180 mg/dL (1-2 hours)      Critically ill patients:  140 - 180 mg/dL   Lab Results  Component Value Date   GLUCAP 225 (H) 06/30/2019   HGBA1C 11.8 (H) 06/25/2019    Review of Glycemic Control  Results for VIHA, BARTOLOMEI (MRN [id number]) as of 06/30/2019 10:23  Ref. Range 06/29/2019 16:20 06/29/2019 19:40 06/29/2019 23:34 06/30/2019 03:54 06/30/2019 08:09  Glucose-Capillary Latest Ref Range: 70 - 99 mg/dL 322 (H) 309 (H) 248 (H) 219 (H) 225 (H)    Diabetes history: DM2 Outpatient Diabetes medications: None Current orders for Inpatient glycemic control: Novolog 0-15 Q4H + Novolog 4 units TID with meals + Lantus 45 units QD + prednisone 30 mg QD  Inpatient Diabetes Program Recommendations:     -Lantus 50 units daily -Novolog 6 units TID with meals   Thank you, Reche Dixon, RN, BSN Diabetes Coordinator Inpatient Diabetes Program [phone] (team pager from 8a-5p)

## 2019-07-01 DIAGNOSIS — I5032 Chronic diastolic (congestive) heart failure: Secondary | ICD-10-CM

## 2019-07-01 LAB — CBC
HCT: 39.2 % (ref 36.0–46.0)
Hemoglobin: 12 g/dL (ref 12.0–15.0)
MCH: 25.5 pg — ABNORMAL LOW (ref 26.0–34.0)
MCHC: 30.6 g/dL (ref 30.0–36.0)
MCV: 83.4 fL (ref 80.0–100.0)
Platelets: 223 10*3/uL (ref 150–400)
RBC: 4.7 MIL/uL (ref 3.87–5.11)
RDW: 15.9 % — ABNORMAL HIGH (ref 11.5–15.5)
WBC: 7.3 10*3/uL (ref 4.0–10.5)
nRBC: 0 % (ref 0.0–0.2)

## 2019-07-01 LAB — BASIC METABOLIC PANEL
Anion gap: 10 (ref 5–15)
BUN: 25 mg/dL — ABNORMAL HIGH (ref 6–20)
CO2: 26 mmol/L (ref 22–32)
Calcium: 8.7 mg/dL — ABNORMAL LOW (ref 8.9–10.3)
Chloride: 101 mmol/L (ref 98–111)
Creatinine, Ser: 0.94 mg/dL (ref 0.44–1.00)
GFR calc Af Amer: >60 mL/min (ref >60)
GFR calc non Af Amer: >60 mL/min (ref >60)
Glucose, Bld: 276 mg/dL — ABNORMAL HIGH (ref 70–99)
Potassium: 3.4 mmol/L — ABNORMAL LOW (ref 3.5–5.1)
Sodium: 137 mmol/L (ref 135–145)

## 2019-07-01 LAB — PROCALCITONIN: Procalcitonin: 0.48 ng/mL

## 2019-07-01 LAB — GLUCOSE, CAPILLARY
Glucose-Capillary: 171 mg/dL — ABNORMAL HIGH (ref 70–99)
Glucose-Capillary: 261 mg/dL — ABNORMAL HIGH (ref 70–99)
Glucose-Capillary: 261 mg/dL — ABNORMAL HIGH (ref 70–99)
Glucose-Capillary: 274 mg/dL — ABNORMAL HIGH (ref 70–99)
Glucose-Capillary: 281 mg/dL — ABNORMAL HIGH (ref 70–99)
Glucose-Capillary: 297 mg/dL — ABNORMAL HIGH (ref 70–99)

## 2019-07-01 LAB — MAGNESIUM: Magnesium: 2.4 mg/dL (ref 1.7–2.4)

## 2019-07-01 MED ORDER — INSULIN ASPART 100 UNIT/ML ~~LOC~~ SOLN
0.0000 [IU] | Freq: Three times a day (TID) | SUBCUTANEOUS | Status: DC
  Administered 2019-07-01 (×2): 11 [IU] via SUBCUTANEOUS
  Administered 2019-07-02 – 2019-07-03 (×3): 3 [IU] via SUBCUTANEOUS
  Administered 2019-07-03: 7 [IU] via SUBCUTANEOUS
  Administered 2019-07-04 – 2019-07-05 (×3): 4 [IU] via SUBCUTANEOUS
  Filled 2019-07-01 (×9): qty 1

## 2019-07-01 MED ORDER — POTASSIUM CHLORIDE 20 MEQ PO PACK
40.0000 meq | PACK | ORAL | Status: AC
  Administered 2019-07-01 (×2): 40 meq via ORAL
  Filled 2019-07-01 (×2): qty 2

## 2019-07-01 MED ORDER — INSULIN ASPART 100 UNIT/ML ~~LOC~~ SOLN
0.0000 [IU] | Freq: Every day | SUBCUTANEOUS | Status: DC
  Filled 2019-07-01: qty 1

## 2019-07-01 MED ORDER — SENNOSIDES-DOCUSATE SODIUM 8.6-50 MG PO TABS
2.0000 | ORAL_TABLET | Freq: Two times a day (BID) | ORAL | Status: DC
  Administered 2019-07-01 – 2019-07-04 (×2): 2 via ORAL
  Filled 2019-07-01 (×4): qty 2

## 2019-07-01 MED ORDER — INSULIN ASPART 100 UNIT/ML ~~LOC~~ SOLN
10.0000 [IU] | Freq: Three times a day (TID) | SUBCUTANEOUS | Status: DC
  Administered 2019-07-01 – 2019-07-05 (×9): 10 [IU] via SUBCUTANEOUS
  Filled 2019-07-01 (×10): qty 1

## 2019-07-01 MED ORDER — SODIUM CHLORIDE 0.9 % IV SOLN
2.0000 g | Freq: Three times a day (TID) | INTRAVENOUS | Status: AC
  Administered 2019-07-01 – 2019-07-04 (×11): 2 g via INTRAVENOUS
  Filled 2019-07-01 (×12): qty 2

## 2019-07-01 MED ORDER — INSULIN DETEMIR 100 UNIT/ML ~~LOC~~ SOLN
30.0000 [IU] | Freq: Two times a day (BID) | SUBCUTANEOUS | Status: DC
  Administered 2019-07-01 – 2019-07-02 (×4): 30 [IU] via SUBCUTANEOUS
  Filled 2019-07-01 (×6): qty 0.3

## 2019-07-01 MED ORDER — CHLORHEXIDINE GLUCONATE CLOTH 2 % EX PADS
6.0000 | MEDICATED_PAD | Freq: Every day | CUTANEOUS | Status: DC
  Administered 2019-07-04 – 2019-07-05 (×2): 6 via TOPICAL

## 2019-07-01 NOTE — Progress Notes (Signed)
PROGRESS NOTE    Michelle [name]  [id number] DOB: 10/17/1960 DOA: 06/24/2019  PCP: Frazier Richards, MD    LOS - 7   Brief Narrative:  Michelle [name] a 59 y.o.femalewith medical history significant ofCOPD, CHF, hyperlipidemia, coronary artery disease, hypertension, hypercholesterolemia, status post tracheostomy, came with a chief complaint of mild shortness of breath with yellow productive sputum. She is on 4 L of oxygen all the time.  Subjective 1/27: Patient sleeping but awoke to voice.  Denies pain or fever/chills, N/V/D or other complaints.  No acute events reported.   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  Presented with increased cough, sputum production, and wheezing.   --PCCM following, will follow their recommendations --DuoNebs, Pulmicort --Chest physiotherapy with MetaNeb --Titrate oxygen keep saturation more than 88% --continue steroid taper --Tobra neb's appear were discontinued and ceftazidime started today  CAP, ruled out --Started on IV Levaquin on admission.  Procal neg.  Trach aspirate grew pseudomonas today which is likely chronic colonization (past 2-3 aspirates cx all grew pseudomonas) --abx d/c'ed --continue Tobra neb BID per ICU  Hyperglycemia New onset diabetes mellitus type 2 Hemoglobin A1c 11.8 --continue Levemir 30 units BID --Novolog 10 units TID AC --resistant sliding scale Novolog  Hypokalemia and Hyperkalemia - resolved --monitor  BMP  Chronic pain medication --Continue fentanyl and oxycodone  Hypercholesterolemia --continue Zocor  Coronary artery disease --continue home Aspirin, Zocor  Status post PEG tube --Flushes normal saline every 6 hours  Dysphagia --Per family patient can have soft diet  Anxiety and depression --continue home Remeron, paxil --Psych consult today to help manage her anxiety --change Klonopin 2 mg twice daily to Klonopin 1.5 mg 3 times daily, per psych rec --increase Seroquel to 100 mg p.o. nightly   FEN: tube feed and soft diet   DVT prophylaxis: Lovenox   Code Status: DNR  Family Communication: none at bedside  Disposition Plan:  Expect d/c home pending further improvement and clearance by PCCM   Consultants:   PCCM   Antimicrobials:   Ceftazidime 1/27 >>>    Objective: Vitals:   07/01/19 0832 07/01/19 0900 07/01/19 1000 07/01/19 1100  BP:  113/67 105/61 107/64  Pulse:  81 74 66  Resp:  20 20 20   Temp:      TempSrc:      SpO2: 98% 97% 96% 97%  Weight:      Height:        Intake/Output Summary (Last 24 hours) at 07/01/2019 1136 Last data filed at 07/01/2019 0900 Gross per 24 hour  Intake 1541.43 ml  Output 2150 ml  Net -608.57 ml   Filed Weights   06/24/19 1936 06/25/19 1343  Weight: 72.6 kg 89.5 kg    Examination:  General exam: awake, alert, no acute distress, obese HEENT: moist mucus membranes, hearing grossly normal  Respiratory system: decreased breath sounds, no wheezes, rales or rhonchi, normal respiratory effort, tracheostomy. Cardiovascular system: normal S1/S2, RRR, no pedal edema.   Central nervous system: alert and oriented x3. no gross focal neurologic deficits, normal speech Extremities:  moves all, normal tone Skin: dry, intact, normal temperature Psychiatry: normal mood, congruent affect, judgement and insight appear normal    Data Reviewed: I have personally reviewed following labs and imaging studies  CBC: Recent  Labs  Lab 06/24/19 2057 06/25/19 0328 06/27/19 0450 06/28/19 0429 06/29/19 0419 06/30/19 0523 07/01/19 0419  WBC 4.8   < > 8.8 11.0* 11.4* 11.6* 7.3  NEUTROABS 2.9  --   --  9.3*  --   --   --   HGB 13.1   < > 12.8 14.4 14.3 13.3 12.0  HCT 42.1   < > 41.9 46.9* 46.5* 43.3 39.2  MCV 81.0   < > 84.1 83.8 83.6 83.8 83.4  PLT 213   < > 195 279 301 320 223   < > = values in this interval not displayed.   Basic Metabolic Panel: Recent Labs  Lab 06/27/19 0932 06/28/19 0429 06/29/19 0419 06/30/19 0523 07/01/19 0419  NA 144 140 141 139 137  K 4.7 5.2* 3.8 2.9* 3.4*  CL 115* 106 103 101 101  CO2 21* 24 29 25 26   GLUCOSE 273* 334* 300* 222* 276*  BUN 7 12 17  29* 25*  CREATININE 0.93 1.05* 1.00 1.68* 0.94  CALCIUM 8.6* 9.1 8.7* 8.5* 8.7*  MG 1.8 1.6* 1.8 1.6* 2.4   GFR: Estimated Creatinine Clearance: 65 mL/min (by C-G formula based on SCr of 0.94 mg/dL). Liver Function Tests: Recent Labs  Lab 06/25/19 0328  AST 41  ALT 23  ALKPHOS 121  BILITOT 0.7  PROT 7.5  ALBUMIN 3.6   No results for input(s): LIPASE, AMYLASE in the last 168 hours. No results for input(s): AMMONIA in the last 168 hours. Coagulation Profile: No results for input(s): INR, PROTIME in the last 168 hours. Cardiac Enzymes: No results for input(s): CKTOTAL, CKMB, CKMBINDEX, TROPONINI in the last 168 hours. BNP (last 3 results) No results for input(s): PROBNP in the last 8760 hours. HbA1C: No results for input(s): HGBA1C in the last 72 hours. CBG: Recent Labs  Lab 06/30/19 2013 06/30/19 2222 07/01/19 0001 07/01/19 0337 07/01/19 0716  GLUCAP 258* 269* 281* 261* 297*   Lipid Profile: No results for input(s): CHOL, HDL, LDLCALC, TRIG, CHOLHDL, LDLDIRECT in the last 72 hours. Thyroid Function Tests: No results for input(s): TSH, T4TOTAL, FREET4, T3FREE, THYROIDAB in the last 72 hours. Anemia Panel: No results for input(s): VITAMINB12, FOLATE, FERRITIN, TIBC, IRON, RETICCTPCT in the last 72  hours. Sepsis Labs: Recent Labs  Lab 06/26/19 0733 06/27/19 1630 06/27/19 1849 06/29/19 0419 06/30/19 0523 07/01/19 0419  PROCALCITON <0.10  --   --  0.40 0.92 0.48  LATICACIDVEN  --  2.4* 2.3*  --   --   --     Recent Results (from the past 240 hour(s))  Culture, respiratory     Status: None   Collection Time: 06/24/19  7:44 PM   Specimen: Tracheal Aspirate  Result Value Ref Range Status   Specimen Description   Final    TRACHEAL ASPIRATE Performed at [facility], [address], [place], [place] [address]    Special Requests   Final    NONE Performed at [facility], [place], [place] [address]    Gram Stain   Final    RARE WBC PRESENT,BOTH PMN AND MONONUCLEAR ABUNDANT GRAM NEGATIVE RODS RARE GRAM POSITIVE RODS RARE GRAM POSITIVE COCCI IN PAIRS RARE BUDDING YEAST SEEN Performed at [facility], [place],  [place] [address]    Culture ABUNDANT PSEUDOMONAS AERUGINOSA  Final   Report Status 06/27/2019 FINAL  Final   Organism ID, Bacteria PSEUDOMONAS AERUGINOSA  Final      Susceptibility   Pseudomonas aeruginosa - MIC*    CEFTAZIDIME 4 SENSITIVE Sensitive     CIPROFLOXACIN >=4 RESISTANT Resistant     GENTAMICIN 8 INTERMEDIATE Intermediate     IMIPENEM 2 SENSITIVE Sensitive     PIP/TAZO 8 SENSITIVE Sensitive     * ABUNDANT PSEUDOMONAS AERUGINOSA  SARS CORONAVIRUS 2 (TAT 6-24 HRS) Nasopharyngeal Nasopharyngeal Swab     Status: None   Collection Time: 06/24/19  7:45 PM   Specimen: Nasopharyngeal Swab  Result Value Ref Range Status   SARS Coronavirus 2 NEGATIVE NEGATIVE Final    Comment: (NOTE) SARS-CoV-2 target nucleic acids are NOT DETECTED. The SARS-CoV-2 RNA is generally detectable in upper and lower respiratory specimens during the acute phase of infection. Negative results do not preclude SARS-CoV-2 infection, do not rule out co-infections with other pathogens, and should not be used as the sole basis for  treatment or other patient management decisions. Negative results must be combined with clinical observations, patient history, and epidemiological information. The expected result is Negative. Fact Sheet for Patients: SugarRoll.be Fact Sheet for Healthcare Providers: https://www.woods-mathews.com/ This test is not yet approved or cleared by the [place] FDA and  has been authorized for detection and/or diagnosis of SARS-CoV-2 by FDA under an Emergency Use Authorization (EUA). This EUA will remain  in effect (meaning this test can be used) for the duration of the COVID-19 declaration under Section 56 4(b)(1) of the Act, 21 U.S.C. section 360bbb-3(b)(1), unless the authorization is terminated or revoked sooner. Performed at [facility], [place] [address] [place], [place] [address]   MRSA PCR Screening     Status: Abnormal   Collection Time: 06/25/19  1:40 PM   Specimen: Nasal Mucosa; Nasopharyngeal  Result Value Ref Range Status   MRSA by PCR POSITIVE (A) NEGATIVE Final    Comment:        The GeneXpert MRSA Assay (FDA approved for NASAL specimens only), is one component of a comprehensive MRSA colonization surveillance program. It is not intended to diagnose MRSA infection nor to guide or monitor treatment for MRSA infections. RESULT CALLED TO, READ BACK BY AND VERIFIED WITH: Adalberto Ill 06/25/19 at 1516. CST Performed at [facility], [address] [place], [place] [address]   Culture, respiratory (non-expectorated)     Status: None (Preliminary result)   Collection Time: 06/27/19  8:53 PM   Specimen: Tracheal Aspirate; Respiratory  Result Value Ref Range Status   Specimen Description   Final    TRACHEAL ASPIRATE Performed at [facility], [address] [place], [place] [address]    Special Requests   Final    NONE Performed at [facility], [address] [place], [place] [address]     Gram Stain   Final    ABUNDANT WBC PRESENT, PREDOMINANTLY PMN ABUNDANT GRAM NEGATIVE RODS RARE GRAM POSITIVE RODS RARE YEAST RARE GRAM POSITIVE COCCI    Culture   Final    ABUNDANT PSEUDOMONAS AERUGINOSA ABUNDANT SERRATIA MARCESCENS SUSCEPTIBILITIES TO FOLLOW Performed at [facility], [address] [place], [place] [address]    Report Status PENDING  Incomplete   Organism ID, Bacteria PSEUDOMONAS AERUGINOSA  Final      Susceptibility   Pseudomonas aeruginosa - MIC*    CEFTAZIDIME 8 SENSITIVE Sensitive  CIPROFLOXACIN >=4 RESISTANT Resistant     GENTAMICIN <=1 SENSITIVE Sensitive     IMIPENEM 2 SENSITIVE Sensitive     PIP/TAZO 8 SENSITIVE Sensitive     * ABUNDANT PSEUDOMONAS AERUGINOSA         Radiology Studies: No results found.      Scheduled Meds: . aspirin  81 mg Oral Daily  . budesonide  0.25 mg Nebulization BID  . Chlorhexidine Gluconate Cloth  6 each Topical Daily  . clonazePAM  1.5 mg Oral TID  . cloNIDine  0.1 mg Transdermal Weekly  . enoxaparin (LOVENOX) injection  40 mg Subcutaneous Q24H  . famotidine  20 mg Per Tube Daily  . fentaNYL  1 patch Transdermal Q72H  . fluticasone  1 spray Each Nare Daily  . insulin aspart  0-20 Units Subcutaneous TID WC  . insulin aspart  0-5 Units Subcutaneous QHS  . insulin aspart  10 Units Subcutaneous TID WC  . insulin detemir  30 Units Subcutaneous BID  . ipratropium-albuterol  3 mL Nebulization Q4H  . living well with diabetes book   Does not apply Once  . loratadine  10 mg Oral Daily  . Melatonin  5 mg Oral Q24H  . mirtazapine  30 mg Oral QHS  . oxyCODONE  5 mg Oral Q24H  . PARoxetine  20 mg Per Tube Daily  . pneumococcal 23 valent vaccine  0.5 mL Intramuscular Tomorrow-1000  . potassium chloride  40 mEq Oral Q4H  . predniSONE  30 mg Oral Q breakfast  . QUEtiapine  100 mg Per Tube QHS  . senna-docusate  2 tablet Oral BID  . simvastatin  40 mg Oral QPM  . sodium chloride flush  3 mL Intravenous Q12H   . sodium chloride irrigation  50 mL Per Tube Q6H   Continuous Infusions: . sodium chloride    . sodium chloride Stopped (06/28/19 0532)  . sodium chloride Stopped (06/30/19 1048)  . cefTAZidime (FORTAZ)  IV 200 mL/hr at 07/01/19 0600     LOS: 7 days    Time spent: 35 minutes    Ezekiel Slocumb, DO [facility]   If 7PM-7AM, please contact night-coverage [email or web address] 07/01/2019, 11:36 AM

## 2019-07-01 NOTE — Progress Notes (Signed)
Pharmacy Antibiotic Note  Michelle [name] is a 59 y.o. female admitted on 06/24/2019 with mild SOB and yellow productive sputum. Pt had COPD exacerbation and acute on chronic respiratory failure with hypoxemia. PMH includes end stage COPD, CHF, HLD, CAD, HTN, hypercholesterolemia, s/p tracheostomy. Respiratory cultures (tracheal aspirate) collected on 01/20 and 01/23 indicate growth of P. aeruginosa. Pt was started on TOBI on 01/24. Per am rounds, discussions were made to D/C TOBI today. Based on sensitivities, ceftazidime provides good coverage against P. aeruginosa. Pharmacy has been consulted for Ceftazidime dosing.  Plan: 01/27 @ 0500 renal function has improved to a Scr of 0.94 w/ CrCl of 65 ml/min; therefore will readjust dose of ceftazidime to 2g IV q8h to start today at 0530 to continue 4 more days of antibiotic therapy. Will continue to monitor.  Height: 5' (152.4 cm) Weight: 197 lb 5 oz (89.5 kg) IBW/kg (Calculated) : 45.5  Temp (24hrs), Avg:98.5 F (36.9 C), Min:98.3 F (36.8 C), Max:98.6 F (37 C)  Recent Labs  Lab 06/26/19 0733 06/27/19 0450 06/27/19 0932 06/27/19 1630 06/27/19 1849 06/28/19 0429 06/29/19 0419 06/30/19 0523 07/01/19 0419  WBC  --  8.8  --   --   --  11.0* 11.4* 11.6* 7.3  CREATININE   < >  --  0.93  --   --  1.05* 1.00 1.68* 0.94  LATICACIDVEN  --   --   --  2.4* 2.3*  --   --   --   --    < > = values in this interval not displayed.    Estimated Creatinine Clearance: 65 mL/min (by C-G formula based on SCr of 0.94 mg/dL).    Allergies  Allergen Reactions  . Amoxicillin     Patient has tolerated cephalosporins in the past    Antimicrobials this admission: Tobramycin 300mg  (TOBI) 01/24 >> 01/25 Ceftazidime 1g Q8H 01/25 >> 01/26 Ceftazidime 2g Q12H 01/26 >> 02/01  Dose adjustments this admission: 01/26 Ceftazidime adjusted to 2g Q12hr.   Microbiology results:  01/20 Respiratory Culture (tracheal aspirate): abundant p. Aeruginosa, finalized   01/23 Respiratory Culture: abundant p. Aeruginosa, pending   01/21 MRSA PCR: Positive 01/20 SARS Coronavirus: Negative   Thank you for allowing pharmacy to be a part of this patient's care.  Tobie Lords, PharmD 07/01/2019 5:24 AM

## 2019-07-01 NOTE — Consult Note (Signed)
PHARMACY CONSULT NOTE - FOLLOW UP  Pharmacy Consult for Electrolyte Monitoring and Replacement   Recent Labs: Potassium (mmol/L)  Date Value  07/01/2019 3.4 (L)  07/11/2014 4.6   Magnesium (mg/dL)  Date Value  07/01/2019 2.4  10/31/2013 2.6 (H)   Calcium (mg/dL)  Date Value  07/01/2019 8.7 (L)   Calcium, Total (mg/dL)  Date Value  07/11/2014 8.9   Albumin (g/dL)  Date Value  06/25/2019 3.6  07/09/2014 3.7   Phosphorus (mg/dL)  Date Value  03/14/2019 4.4  10/31/2013 4.6   Sodium (mmol/L)  Date Value  07/01/2019 137  07/11/2014 139     Assessment: Pharmacy has been consulted to manage electrolytes for this 59 year-old female admitted with end stage COPD exacerbation and ventilator dependent chronic hypercapnic respiratory failure.  PMH is significant for COPD, CHF, CAD, hyperlipidemia, hypertension, and tracheostomy.  Patient has been refusing POs, but per RN is accepting POs today.  Electrolytes Goals of therapy given cardiac history: K ~4 Mag ~2 All other electrolytes WNL. Potassium increased from 2.9 to 3.4 on KCl 10 mEq q1 hour x 8 total doses yesterday.  Will give KCl 40 mEq PO x 2 doses to achieve goal ~4. Will order electrolytes with AM labs.  Glucose Hemoglobin A1c 11.8.  SBG trending in the 250-300s on prednisone.  Patient currently receiving moderate SSI TIDWM, Novolog 6 units TIDWM, Novolog 0-5 units QHS, and Lantus 50 units QHS.  Will follow diabetes coordinator recommendation, and increase to resistant SSI TIDWM, Novolog 10 units TIDWM, Levemir 30 units BID.  Will reassess SBG trend tomorrow.  Constipation Last BM was 1/24.  Patient currently receiving scheduled oxycodone and PRN percocet while on fentanyl patch.  Current orders include docusate PRN, miralax PRN, and scheduled Senna.  Will change to scheduled senna-docusate, and PRN miralax.  Follow response tomorrow.  Gerald Dexter ,PharmD 07/01/2019 10:59 AM

## 2019-07-01 NOTE — Progress Notes (Addendum)
Inpatient Diabetes Program Recommendations  AACE/ADA: New Consensus Statement on Inpatient Glycemic Control (2015)  Target Ranges:  Prepandial:   less than 140 mg/dL      Peak postprandial:   less than 180 mg/dL (1-2 hours)      Critically ill patients:  140 - 180 mg/dL   Lab Results  Component Value Date   GLUCAP 297 (H) 07/01/2019   HGBA1C 11.8 (H) 06/25/2019    Review of Glycemic Control  Results for DULSE, BOCH (MRN [id number]) as of 07/01/2019 08:44  Ref. Range 06/30/2019 20:13 06/30/2019 22:22 07/01/2019 00:01 07/01/2019 03:37 07/01/2019 07:16  Glucose-Capillary Latest Ref Range: 70 - 99 mg/dL 258 (H) 269 (H) 281 (H) 261 (H) 297 (H)    Diabetes history: New DM2 Outpatient Diabetes medications: None Current orders for Inpatient glycemic control: Novolog 0-15 units TID + 0-5 QHS + Novolog 6 units TID with meals + Lantus 50 units daily + Prednisone 30 mg daily  Inpatient Diabetes Program Recommendations:     If Steroids continue, please consider  -Novolog 0-20 units TID with meals  -Novolog 10 units TID with meals  -Levemir 30 units BID (first dose @ 10am today)  Addendum @ 1530-Spoke with patient at bedside to follow up with any questions related to diabetes.  Patient states she does not have any questions at this time and is looking forward to going home.    Thank you, Reche Dixon, RN, BSN Diabetes Coordinator Inpatient Diabetes Program [phone] (team pager from 8a-5p)

## 2019-07-01 NOTE — Progress Notes (Signed)
Per NT, Pt refused bath today.  Scheduled CHG for tomorrow d/t refusal.

## 2019-07-02 LAB — BASIC METABOLIC PANEL
Anion gap: 9 (ref 5–15)
BUN: 25 mg/dL — ABNORMAL HIGH (ref 6–20)
CO2: 27 mmol/L (ref 22–32)
Calcium: 8.6 mg/dL — ABNORMAL LOW (ref 8.9–10.3)
Chloride: 106 mmol/L (ref 98–111)
Creatinine, Ser: 0.83 mg/dL (ref 0.44–1.00)
GFR calc Af Amer: >60 mL/min (ref >60)
GFR calc non Af Amer: >60 mL/min (ref >60)
Glucose, Bld: 104 mg/dL — ABNORMAL HIGH (ref 70–99)
Potassium: 3.4 mmol/L — ABNORMAL LOW (ref 3.5–5.1)
Sodium: 142 mmol/L (ref 135–145)

## 2019-07-02 LAB — GLUCOSE, CAPILLARY
Glucose-Capillary: 115 mg/dL — ABNORMAL HIGH (ref 70–99)
Glucose-Capillary: 140 mg/dL — ABNORMAL HIGH (ref 70–99)
Glucose-Capillary: 146 mg/dL — ABNORMAL HIGH (ref 70–99)
Glucose-Capillary: 187 mg/dL — ABNORMAL HIGH (ref 70–99)

## 2019-07-02 LAB — CULTURE, RESPIRATORY W GRAM STAIN

## 2019-07-02 LAB — PHOSPHORUS: Phosphorus: 3.7 mg/dL (ref 2.5–4.6)

## 2019-07-02 LAB — MAGNESIUM: Magnesium: 2.4 mg/dL (ref 1.7–2.4)

## 2019-07-02 MED ORDER — POTASSIUM CHLORIDE 20 MEQ PO PACK
40.0000 meq | PACK | Freq: Two times a day (BID) | ORAL | Status: DC
  Administered 2019-07-02 – 2019-07-03 (×2): 40 meq via ORAL
  Filled 2019-07-02 (×3): qty 2

## 2019-07-02 MED ORDER — ENSURE MAX PROTEIN PO LIQD
11.0000 [oz_av] | Freq: Two times a day (BID) | ORAL | Status: DC
  Filled 2019-07-02: qty 330

## 2019-07-02 MED ORDER — POLYETHYLENE GLYCOL 3350 17 G PO PACK
17.0000 g | PACK | Freq: Every day | ORAL | Status: DC
  Administered 2019-07-04: 17 g via ORAL
  Filled 2019-07-02: qty 1

## 2019-07-02 NOTE — Progress Notes (Signed)
PROGRESS NOTE    Michelle [name]  [id number] DOB: Sep 28, 1960 DOA: 06/24/2019  PCP: Frazier Richards, MD    LOS - 8   Brief Narrative:  Michelle [name] a 59 y.o.femalewith medical history significant ofCOPD, CHF, hyperlipidemia, coronary artery disease, hypertension, hypercholesterolemia, status post tracheostomy and vent-dependent, came with a chief complaint of shortness of breath with yellow productive sputum. She is on 4 L of oxygen all the time.  In the ED, patient was in mild respiratory distress.  Chest xray showed lower lobe infiltrate.  Labs notable for glucose 563, normal anion gap, no leukocytosis.  Tracheal aspirate culture obtained and growing Pseudomonas, being treated with ceftazidime.  Pulmonary following.  Subjective 1/28: Patient reports feeling well today.  States she has not had any more panic attacks since medication changes a few days ago. Denies fever/chills, CP, SOB or other acute complaints.  No acute events reported overnight.  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  Presented with increased cough, sputum production, and wheezing.   --PCCM following, will follow their recommendations --DuoNebs, Pulmicort --Chest physiotherapy with MetaNeb --Titrate oxygen keep saturation more than 88% --continue steroid taper --continue ceftazidime, per pulm needs at least a few more days IV abx  CAP, ruled out --Started on IV Levaquin on admission. Procal neg. Trach  aspirate grew pseudomonas today which is likely chronic colonization (past 2-3 aspirates cx all grew pseudomonas) --abx d/c'ed --continue Tobra neb BID per ICU  Hyperglycemia New onset diabetes mellitus type 2 Hemoglobin A1c 11.8 --continue Levemir 30 units BID --Novolog 10 units TID AC --resistant sliding scale Novolog  Hypokalemia and Hyperkalemia - resolved --monitor BMP  Chronic pain medication --Continue fentanyl and oxycodone  Hypercholesterolemia --continue Zocor  Coronary artery disease --continue home Aspirin, Zocor  Status post PEG tube --Flushes normal saline every 6 hours  Dysphagia --Per family patient can have soft diet  Anxiety and depression --continue home Remeron, paxil --Psych consulttodayto help manage her anxiety --change Klonopin 2 mg twice daily to Klonopin 1.5 mg 3 times daily, per psych rec --increase Seroquel to 100 mg p.o. nightly   FEN: tube feed and soft diet   DVT prophylaxis: Lovenox   Code Status: DNR  Family Communication: none at bedside  Disposition Plan:  Expect d/c home pending further improvement and clearance by PCCM.  Patient continues to require IV antibiotics and close monitoring in the hospital.   Consultants:   PCCM   Antimicrobials:   Ceftazidime 1/27 >>>    Objective: Vitals:   07/02/19 1100 07/02/19 1115 07/02/19 1200 07/02/19 1300  BP: 132/78  138/83 (!) 159/96  Pulse: 68  88 87  Resp: 20  20 18   Temp:   (!) 97.4 F (36.3 C)   TempSrc:      SpO2: 97% 96% 97% 94%  Weight:      Height:        Intake/Output Summary (Last 24 hours) at 07/02/2019 1434 Last data filed at 07/02/2019 1300 Gross per 24 hour  Intake 100 ml  Output 1000 ml  Net -900  ml   Filed Weights   06/24/19 1936 06/25/19 1343  Weight: 72.6 kg 89.5 kg    Examination:  General exam: awake, alert, no acute distress, obese HEENT: moist mucus membranes, hearing grossly normal  Respiratory system: decreased breath  sounds, no wheezes, rales or rhonchi, normal respiratory effort. Cardiovascular system: normal S1/S2, RRR, no JVD, murmurs, rubs, gallops, no pedal edema.   Central nervous system: alert and oriented x3. no gross focal neurologic deficits, normal speech Extremities: moves all, no cyanosis, normal tone Skin: dry, intact, normal temperature Psychiatry: normal mood, congruent affect, judgement and insight appear normal    Data Reviewed: I have personally reviewed following labs and imaging studies  CBC: Recent Labs  Lab 06/27/19 0450 06/28/19 0429 06/29/19 0419 06/30/19 0523 07/01/19 0419  WBC 8.8 11.0* 11.4* 11.6* 7.3  NEUTROABS  --  9.3*  --   --   --   HGB 12.8 14.4 14.3 13.3 12.0  HCT 41.9 46.9* 46.5* 43.3 39.2  MCV 84.1 83.8 83.6 83.8 83.4  PLT 195 279 301 320 [id number]   Basic Metabolic Panel: Recent Labs  Lab 06/28/19 0429 06/29/19 0419 06/30/19 0523 07/01/19 0419 07/02/19 0838  NA 140 141 139 137 142  K 5.2* 3.8 2.9* 3.4* 3.4*  CL 106 103 101 101 106  CO2 24 29 25 26 27   GLUCOSE 334* 300* 222* 276* 104*  BUN 12 17 29* 25* 25*  CREATININE 1.05* 1.00 1.68* 0.94 0.83  CALCIUM 9.1 8.7* 8.5* 8.7* 8.6*  MG 1.6* 1.8 1.6* 2.4 2.4  PHOS  --   --   --   --  3.7   GFR: Estimated Creatinine Clearance: 73.6 mL/min (by C-G formula based on SCr of 0.83 mg/dL). Liver Function Tests: No results for input(s): AST, ALT, ALKPHOS, BILITOT, PROT, ALBUMIN in the last 168 hours. No results for input(s): LIPASE, AMYLASE in the last 168 hours. No results for input(s): AMMONIA in the last 168 hours. Coagulation Profile: No results for input(s): INR, PROTIME in the last 168 hours. Cardiac Enzymes: No results for input(s): CKTOTAL, CKMB, CKMBINDEX, TROPONINI in the last 168 hours. BNP (last 3 results) No results for input(s): PROBNP in the last 8760 hours. HbA1C: No results for input(s): HGBA1C in the last 72 hours. CBG: Recent Labs  Lab 07/01/19 1136 07/01/19 1527 07/01/19 2101  07/02/19 0719 07/02/19 1103  GLUCAP 261* 274* 171* 115* 140*   Lipid Profile: No results for input(s): CHOL, HDL, LDLCALC, TRIG, CHOLHDL, LDLDIRECT in the last 72 hours. Thyroid Function Tests: No results for input(s): TSH, T4TOTAL, FREET4, T3FREE, THYROIDAB in the last 72 hours. Anemia Panel: No results for input(s): VITAMINB12, FOLATE, FERRITIN, TIBC, IRON, RETICCTPCT in the last 72 hours. Sepsis Labs: Recent Labs  Lab 06/26/19 0733 06/27/19 1630 06/27/19 1849 06/29/19 0419 06/30/19 0523 07/01/19 0419  PROCALCITON <0.10  --   --  0.40 0.92 0.48  LATICACIDVEN  --  2.4* 2.3*  --   --   --     Recent Results (from the past 240 hour(s))  Culture, respiratory     Status: None   Collection Time: 06/24/19  7:44 PM   Specimen: Tracheal Aspirate  Result Value Ref Range Status   Specimen Description   Final    TRACHEAL ASPIRATE Performed at [facility], [address], [place], [place] [address]    Special Requests   Final    NONE Performed at [facility], [address], [place], [place] [address]  Gram Stain   Final    RARE WBC PRESENT,BOTH PMN AND MONONUCLEAR ABUNDANT GRAM NEGATIVE RODS RARE GRAM POSITIVE RODS RARE GRAM POSITIVE COCCI IN PAIRS RARE BUDDING YEAST SEEN Performed at [facility], [address], [place], [place] [address]    Culture ABUNDANT PSEUDOMONAS AERUGINOSA  Final   Report Status 06/27/2019 FINAL  Final   Organism ID, Bacteria PSEUDOMONAS AERUGINOSA  Final      Susceptibility   Pseudomonas aeruginosa - MIC*    CEFTAZIDIME 4 SENSITIVE Sensitive     CIPROFLOXACIN >=4 RESISTANT Resistant     GENTAMICIN 8 INTERMEDIATE Intermediate     IMIPENEM 2 SENSITIVE Sensitive     PIP/TAZO 8 SENSITIVE Sensitive     * ABUNDANT PSEUDOMONAS AERUGINOSA  SARS CORONAVIRUS 2 (TAT 6-24 HRS) Nasopharyngeal Nasopharyngeal Swab     Status: None   Collection Time: 06/24/19  7:45 PM   Specimen: Nasopharyngeal Swab  Result Value Ref Range  Status   SARS Coronavirus 2 NEGATIVE NEGATIVE Final    Comment: (NOTE) SARS-CoV-2 target nucleic acids are NOT DETECTED. The SARS-CoV-2 RNA is generally detectable in upper and lower respiratory specimens during the acute phase of infection. Negative results do not preclude SARS-CoV-2 infection, do not rule out co-infections with other pathogens, and should not be used as the sole basis for treatment or other patient management decisions. Negative results must be combined with clinical observations, patient history, and epidemiological information. The expected result is Negative. Fact Sheet for Patients: SugarRoll.be Fact Sheet for Healthcare Providers: https://www.woods-mathews.com/ This test is not yet approved or cleared by the [place] FDA and  has been authorized for detection and/or diagnosis of SARS-CoV-2 by FDA under an Emergency Use Authorization (EUA). This EUA will remain  in effect (meaning this test can be used) for the duration of the COVID-19 declaration under Section 56 4(b)(1) of the Act, 21 U.S.C. section 360bbb-3(b)(1), unless the authorization is terminated or revoked sooner. Performed at [facility], [place] [address] [place], [place] [address]   MRSA PCR Screening     Status: Abnormal   Collection Time: 06/25/19  1:40 PM   Specimen: Nasal Mucosa; Nasopharyngeal  Result Value Ref Range Status   MRSA by PCR POSITIVE (A) NEGATIVE Final    Comment:        The GeneXpert MRSA Assay (FDA approved for NASAL specimens only), is one component of a comprehensive MRSA colonization surveillance program. It is not intended to diagnose MRSA infection nor to guide or monitor treatment for MRSA infections. RESULT CALLED TO, READ BACK BY AND VERIFIED WITH: Adalberto Ill 06/25/19 at 1516. CST Performed at [facility], [address] [place], [place] [address]   Culture, respiratory (non-expectorated)      Status: None   Collection Time: 06/27/19  8:53 PM   Specimen: Tracheal Aspirate; Respiratory  Result Value Ref Range Status   Specimen Description   Final    TRACHEAL ASPIRATE Performed at [facility], [address] [place], [place] [address]    Special Requests   Final    NONE Performed at [facility], [address] [place], [place] [address]    Gram Stain   Final    ABUNDANT WBC PRESENT, PREDOMINANTLY PMN ABUNDANT GRAM NEGATIVE RODS RARE GRAM POSITIVE RODS RARE YEAST RARE GRAM POSITIVE COCCI Performed at [facility], [place] [address] [place],  [address]    Culture   Final    ABUNDANT PSEUDOMONAS AERUGINOSA ABUNDANT  SERRATIA MARCESCENS    Report Status 07/02/2019 FINAL  Final   Organism ID, Bacteria PSEUDOMONAS AERUGINOSA  Final   Organism ID, Bacteria SERRATIA MARCESCENS  Final      Susceptibility   Pseudomonas aeruginosa - MIC*    CEFTAZIDIME 8 SENSITIVE Sensitive     CIPROFLOXACIN >=4 RESISTANT Resistant     GENTAMICIN <=1 SENSITIVE Sensitive     IMIPENEM 2 SENSITIVE Sensitive     PIP/TAZO 8 SENSITIVE Sensitive     * ABUNDANT PSEUDOMONAS AERUGINOSA   Serratia marcescens - MIC*    CEFAZOLIN >=64 RESISTANT Resistant     CEFEPIME <=0.12 SENSITIVE Sensitive     CEFTAZIDIME <=1 SENSITIVE Sensitive     CEFTRIAXONE <=0.25 SENSITIVE Sensitive     CIPROFLOXACIN 0.5 SENSITIVE Sensitive     GENTAMICIN <=1 SENSITIVE Sensitive     TRIMETH/SULFA <=20 SENSITIVE Sensitive     * ABUNDANT SERRATIA MARCESCENS         Radiology Studies: No results found.      Scheduled Meds: . aspirin  81 mg Oral Daily  . budesonide  0.25 mg Nebulization BID  . Chlorhexidine Gluconate Cloth  6 each Topical Daily  . clonazePAM  1.5 mg Oral TID  . cloNIDine  0.1 mg Transdermal Weekly  . enoxaparin (LOVENOX) injection  40 mg Subcutaneous Q24H  . famotidine  20 mg Per Tube Daily  . fentaNYL  1 patch Transdermal Q72H  . fluticasone  1 spray Each Nare  Daily  . insulin aspart  0-20 Units Subcutaneous TID WC  . insulin aspart  0-5 Units Subcutaneous QHS  . insulin aspart  10 Units Subcutaneous TID WC  . insulin detemir  30 Units Subcutaneous BID  . ipratropium-albuterol  3 mL Nebulization Q4H  . living well with diabetes book   Does not apply Once  . loratadine  10 mg Oral Daily  . Melatonin  5 mg Oral Q24H  . mirtazapine  30 mg Oral QHS  . oxyCODONE  5 mg Oral Q24H  . PARoxetine  20 mg Per Tube Daily  . pneumococcal 23 valent vaccine  0.5 mL Intramuscular Tomorrow-1000  . polyethylene glycol  17 g Oral Daily  . potassium chloride  40 mEq Oral BID  . predniSONE  30 mg Oral Q breakfast  . Ensure Max Protein  11 oz Oral BID  . QUEtiapine  100 mg Per Tube QHS  . senna-docusate  2 tablet Oral BID  . simvastatin  40 mg Oral QPM  . sodium chloride flush  3 mL Intravenous Q12H  . sodium chloride irrigation  50 mL Per Tube Q6H   Continuous Infusions: . sodium chloride    . sodium chloride Stopped (06/28/19 0532)  . sodium chloride Stopped (06/30/19 1048)  . cefTAZidime (FORTAZ)  IV 2 g (07/02/19 0522)     LOS: 8 days    Time spent: 25-30 minutes    Ezekiel Slocumb, DO [facility]   If 7PM-7AM, please contact night-coverage [email or web address] 07/02/2019, 2:34 PM

## 2019-07-02 NOTE — Consult Note (Signed)
PHARMACY CONSULT NOTE - FOLLOW UP  Pharmacy Consult for Electrolyte Monitoring and Replacement   Recent Labs: Potassium (mmol/L)  Date Value  07/02/2019 3.4 (L)  07/11/2014 4.6   Magnesium (mg/dL)  Date Value  07/02/2019 2.4  10/31/2013 2.6 (H)   Calcium (mg/dL)  Date Value  07/02/2019 8.6 (L)   Calcium, Total (mg/dL)  Date Value  07/11/2014 8.9   Albumin (g/dL)  Date Value  06/25/2019 3.6  07/09/2014 3.7   Phosphorus (mg/dL)  Date Value  07/02/2019 3.7  10/31/2013 4.6   Sodium (mmol/L)  Date Value  07/02/2019 142  07/11/2014 139     Assessment: Pharmacy has been consulted to manage electrolytes for this 59 year-old female admitted with end stage COPD exacerbation and ventilator dependent chronic hypercapnic respiratory failure.  PMH is significant for COPD, CHF, CAD, hyperlipidemia, hypertension, and tracheostomy.  Patient had been refusing POs, but per RN she resumed POs yesterday.  Electrolytes Goals of therapy given cardiac history: K ~4 Mag ~2 All other electrolytes WNL. Potassium remained at 3.4 despite giving KCl 40 mEq PO x 2 doses.  Will give potassium 40 mEq BID x 4 doses.   Will order electrolytes and magnesium with AM labs.  Glucose Hemoglobin A1c 11.8.  SBG trending <115 - 170 on prednisone.  Patient currently receiving moderate SSI TIDWM, Novolog 10 units TIDWM, Novolog 0-5 units QHS, and Levemir 30 units BID.  Will reassess SBG trend tomorrow.  Constipation Last BM was 1/24.  Patient currently receiving scheduled oxycodone and PRN percocet while on fentanyl patch.  Current orders include scheduled senna-docusate, and PRN miralax.  Patient has had 1 dose of senna-docusate, but refused last night.  Will schedule miralax, and follow response tomorrow.  Michelle [name] ,PharmD 07/02/2019 10:56 AM

## 2019-07-02 NOTE — Progress Notes (Signed)
[place] visited pt. briefly while rounding on ICU and in follow-up from previous visit this wk.  Pt. appeared grateful for [facility] visit; said she is doing 'much better'; reported no more panic attacks since CH's last visit; said previous episodes partially related to particular medication she was previously being given, and that the change in meds had alleviated her anxiety.  Pt. insistent that 'God has always been with me'; anticipates discharge tmrw. after being treated for an infection tonight.  Pt. grateful for CH's prior visit and for [facility]'s follow up visit later that day.  [place] remains available as needed and will check in tomorrow if possible.      07/02/19 1700  Clinical Encounter Type  Visited With Patient  Visit Type Follow-up;Spiritual support;Social support;Psychological support;Critical Care  Referral From Patient  Spiritual Encounters  Spiritual Needs Emotional  Stress Factors  Patient Stress Factors Health changes

## 2019-07-02 NOTE — Progress Notes (Signed)
Nutrition Follow Up Note   DOCUMENTATION CODES:   Obesity unspecified  INTERVENTION:   Ensure Max protein supplement BID, each supplement provides 150kcal and 30g of protein.  NUTRITION DIAGNOSIS:   Increased nutrient needs related to chronic illness(COPD) as evidenced by increased estimated needs.  GOAL:   Patient will meet greater than or equal to 90% of their needs -progressing   MONITOR:   PO intake, Supplement acceptance, Labs, Weight trends, Skin, I & O's  ASSESSMENT:   59 year old female with PMHx of emphysema, COPD, HTN, CAD, hx cocaine abuse, asthma, CHF, anxiety, hepatitis C, hx tracheostomy tube placement 10/17/2017 who uses ventilator at home, hx G-tube placement 10/31/2017 admitted with acute COPD exacerbation and PNA. Pt also with new DM diagnosis   Pt with good appetite and oral intake in hospital; pt eating 100% of meals. RD will re-order supplements as these were discontinued when patient was made NPO on 1/24. Last documented weight in chart is from 1/21; RD will request weekly weights. Plan is for patient to discharge once antibiotic regimen complete.   Medications reviewed and include: aspirin, lovenox, insulin, melatonin, remeron, oxycodone, miralax, KCl, prednisone, senokot, ceftazidine   Labs reviewed: K 3.4(L), P 3.7 wnl, Mg 2.4 wnl cbgs- 261, 274, 171, 115, 140 x 24 hrs  Diet Order:   Diet Order            Diet heart healthy/carb modified Room service appropriate? Yes; Fluid consistency: Thin  Diet effective now             EDUCATION NEEDS:   No education needs have been identified at this time  Skin:  Skin Assessment: Reviewed RN Assessment  Last BM:  1/24- type 7  Height:   Ht Readings from Last 1 Encounters:  06/25/19 5' (1.524 m)    Weight:   Wt Readings from Last 1 Encounters:  06/25/19 89.5 kg    Ideal Body Weight:  45.4 kg  BMI:  Body mass index is 38.53 kg/m.  Estimated Nutritional Needs:   Kcal:   1600-1800kcal/day  Protein:  90-100g/day  Fluid:  1.4-1.6L/day  Koleen Distance MS, RD, LDN Pager #- [phone] Office#- [phone] After Hours Pager: [phone]

## 2019-07-02 NOTE — Progress Notes (Signed)
CRITICAL CARE PROGRESS NOTE    Name: Michelle [name] MRN: [id number] DOB: [birth year]     LOS: 45   SUBJECTIVE FINDINGS & SIGNIFICANT EVENTS   Patient description:   HPI: Michelle [name] is a 59 y.o. female with medical history significant of end stage COPD, CHF, hyperlipidemia, coronary artery disease, hypertension, hypercholesterolemia, status post tracheostomy, came with a chief complaint of mild shortness of breath with yellow productive sputum.  She is on 4 L of oxygen all the time. In the emergency room in mild respiratory distressWith chest x-ray small lower lobe infiltrate, potassium 3.0, blood sugar 563, normal anion gap normal BUN and creatinine BNP 42 troponin 20,, normal white count, hemoglobin 13.1 VENT DEPENDANT CHRONIC HYPERCAPNIC RESP FAILURE     Lines / Drains: PIVx2  Cultures / Sepsis markers: respiratory  Antibiotics: Levaquin   Protocols / Consultants: Hospitalist and critical care  06/26/19-patient had another episode of resp distress overnight 06/27/19- patient clinically improved, serial lavages via tracheal catheter to aspirate mucus plugging done by RT today, patient feels better.  06/28/19- off precedex infusion, patient with pseudomonas colonization however repeated bouts of respiratory distress and readmissions, will start TOBI bid today, plan to d/c home soon if continues to stay off precedex 1/25 during the day-did nOT tolerate home vent Very agitated, precedex infusion started 1/27 on home vent, wants to go, I explained she needs ABX for pneumonia  CC Follow up pneumonia  HPI On home vent Less agitated On IV abx Not on precedex  Not on pressors   PAST MEDICAL HISTORY   Past Medical History:  Diagnosis Date  . Allergy   . Anxiety   . Asthma   . CHF (congestive heart  failure) ([place])   . Cocaine abuse ([place])   . COPD (chronic obstructive pulmonary disease) ([place])   . Coronary artery disease   . Emphysema/COPD ([place])   . Hepatitis C   . High cholesterol   . Hypertension   . Pneumothorax   . Tobacco abuse      SURGICAL HISTORY   Past Surgical History:  Procedure Laterality Date  . CARDIAC CATHETERIZATION    . CHEST TUBE INSERTION    . IR GASTROSTOMY TUBE MOD SED  10/31/2017  . TRACHEOSTOMY TUBE PLACEMENT N/A 10/17/2017   Procedure: TRACHEOSTOMY;  Surgeon: Carloyn Manner, MD;  Location: [facility] ORS;  Service: ENT;  Laterality: N/A;     FAMILY HISTORY   Family History  Problem Relation Age of Onset  . Lung cancer Mother   . Asthma Mother   . CVA Father   . CAD Father   . Stroke Father   . Breast cancer Maternal Aunt 67  . COPD Sister      SOCIAL HISTORY   Social History   Tobacco Use  . Smoking status: Former Smoker    Packs/day: 0.10    Years: 41.00    Pack years: 4.10    Types: Cigarettes    Quit date: 06/09/2017    Years since quitting: 2.0  . Smokeless tobacco: Never Used  Substance Use Topics  . Alcohol use: No  . Drug use: Yes    Types: "Crack" cocaine    Comment: Past cocaine use.  Quit 04/2017     MEDICATIONS   Current Medication:  Current Facility-Administered Medications:  .  0.9 %  sodium chloride infusion, 250 mL, Intravenous, PRN, Cristescu, Mircea G, MD .  0.9 %  sodium chloride infusion, 250 mL, Intravenous, Continuous, Blakeney, Dreama Saa,  NP, Stopped at 06/28/19 0532 .  0.9 %  sodium chloride infusion, 250 mL, Intravenous, Continuous, Awilda Bill, NP, Stopped at 06/30/19 1048 .  alum & mag hydroxide-simeth (MAALOX/MYLANTA) 200-200-20 MG/5ML suspension 15 mL, 15 mL, Oral, Q6H PRN, Enzo Bi, MD .  aspirin chewable tablet 81 mg, 81 mg, Oral, Daily, Cristescu, Linard Millers, MD, 81 mg at 07/01/19 0943 .  budesonide (PULMICORT) nebulizer solution 0.25 mg, 0.25 mg, Nebulization, BID, Lanney Gins, Fuad, MD, 0.25 mg  at 07/02/19 0804 .  calcium carbonate (TUMS - dosed in mg elemental calcium) chewable tablet 200 mg of elemental calcium, 1 tablet, Oral, TID PRN, Enzo Bi, MD .  cefTAZidime (FORTAZ) 2 g in sodium chloride 0.9 % 100 mL IVPB, 2 g, Intravenous, Q8H, Griffith, Kelly A, DO, Last Rate: 200 mL/hr at 07/02/19 0522, 2 g at 07/02/19 0522 .  Chlorhexidine Gluconate Cloth 2 % PADS 6 each, 6 each, Topical, Daily, Nicole Kindred A, DO .  clonazePAM (KLONOPIN) tablet 1.5 mg, 1.5 mg, Oral, TID, Cristofano, Dorene Ar, MD, 1.5 mg at 07/01/19 2153 .  cloNIDine (CATAPRES - Dosed in mg/24 hr) patch 0.1 mg, 0.1 mg, Transdermal, Weekly, Awilda Bill, NP, 0.1 mg at 06/27/19 2258 .  enoxaparin (LOVENOX) injection 40 mg, 40 mg, Subcutaneous, Q24H, Cristescu, Mircea G, MD, 40 mg at 07/01/19 2148 .  famotidine (PEPCID) tablet 20 mg, 20 mg, Per Tube, Daily, Cristescu, Mircea G, MD, 20 mg at 07/01/19 0943 .  fentaNYL (DURAGESIC) 25 MCG/HR 1 patch, 1 patch, Transdermal, Q72H, Cristescu, Linard Millers, MD, 1 patch at 06/30/19 2327 .  fluticasone (FLONASE) 50 MCG/ACT nasal spray 1 spray, 1 spray, Each Nare, Daily, Cristescu, Linard Millers, MD, 1 spray at 07/01/19 0945 .  guaiFENesin-dextromethorphan (ROBITUSSIN DM) 100-10 MG/5ML syrup 10 mL, 10 mL, Oral, Q6H PRN, Enzo Bi, MD .  hydrALAZINE (APRESOLINE) injection 10-20 mg, 10-20 mg, Intravenous, Q4H PRN, Awilda Bill, NP, 20 mg at 06/29/19 2125 .  insulin aspart (novoLOG) injection 0-20 Units, 0-20 Units, Subcutaneous, TID WC, Charlett Nose, RPH, 11 Units at 07/01/19 1602 .  insulin aspart (novoLOG) injection 0-5 Units, 0-5 Units, Subcutaneous, QHS, Charlett Nose, RPH .  insulin aspart (novoLOG) injection 10 Units, 10 Units, Subcutaneous, TID WC, Charlett Nose, RPH, 10 Units at 07/02/19 0855 .  insulin detemir (LEVEMIR) injection 30 Units, 30 Units, Subcutaneous, BID, Charlett Nose, RPH, 30 Units at 07/01/19 2142 .  ipratropium-albuterol (DUONEB) 0.5-2.5 (3)  MG/3ML nebulizer solution 3 mL, 3 mL, Nebulization, Q6H PRN, Cristescu, Mircea G, MD, 3 mL at 06/29/19 0450 .  ipratropium-albuterol (DUONEB) 0.5-2.5 (3) MG/3ML nebulizer solution 3 mL, 3 mL, Nebulization, Q4H, Irja Wheless, MD, 3 mL at 07/02/19 0804 .  living well with diabetes book MISC, , Does not apply, Once, Enzo Bi, MD .  loratadine (CLARITIN) tablet 10 mg, 10 mg, Oral, Daily, Cristescu, Linard Millers, MD, 10 mg at 07/01/19 0943 .  LORazepam (ATIVAN) injection 1 mg, 1 mg, Intravenous, Q4H PRN, Ottie Glazier, MD, 1 mg at 06/29/19 2005 .  Melatonin TABS 5 mg, 5 mg, Oral, Q24H, Nai Borromeo, MD, 5 mg at 07/01/19 2004 .  mirtazapine (REMERON SOL-TAB) disintegrating tablet 30 mg, 30 mg, Oral, QHS, Aleskerov, Fuad, MD, 30 mg at 07/01/19 2144 .  ondansetron (ZOFRAN) injection 4 mg, 4 mg, Intravenous, Q6H PRN, Enzo Bi, MD, 4 mg at 07/01/19 1534 .  ondansetron (ZOFRAN-ODT) disintegrating tablet 4 mg, 4 mg, Oral, Q8H PRN, Enzo Bi, MD .  oxyCODONE (Oxy IR/ROXICODONE)  immediate release tablet 5 mg, 5 mg, Oral, Q24H, Bryelle Spiewak, MD, 5 mg at 07/01/19 1719 .  oxyCODONE-acetaminophen (PERCOCET/ROXICET) 5-325 MG per tablet 1 tablet, 1 tablet, Oral, Q8H PRN, Cristescu, Linard Millers, MD, 1 tablet at 07/01/19 1504 .  PARoxetine (PAXIL) tablet 20 mg, 20 mg, Per Tube, Daily, Cristescu, Linard Millers, MD, 20 mg at 07/01/19 0943 .  pneumococcal 23 valent vaccine (PNEUMOVAX-23) injection 0.5 mL, 0.5 mL, Intramuscular, Tomorrow-1000, Enzo Bi, MD .  polyethylene glycol (MIRALAX / GLYCOLAX) packet 17 g, 17 g, Oral, BID PRN, Enzo Bi, MD .  predniSONE (DELTASONE) tablet 30 mg, 30 mg, Oral, Q breakfast, Ottie Glazier, MD, 20 mg at 07/02/19 0855 .  QUEtiapine (SEROQUEL) tablet 100 mg, 100 mg, Per Tube, QHS, Cristofano, Dorene Ar, MD, 100 mg at 07/01/19 2143 .  senna-docusate (Senokot-S) tablet 2 tablet, 2 tablet, Oral, BID, Gerald Dexter, [facility], 2 tablet at 07/01/19 1153 .  simvastatin (ZOCOR) tablet 40 mg, 40 mg, Oral,  QPM, Cristescu, Mircea G, MD, 40 mg at 07/01/19 1719 .  sodium chloride flush (NS) 0.9 % injection 3 mL, 3 mL, Intravenous, Q12H, Cristescu, Mircea G, MD, 3 mL at 07/01/19 0945 .  sodium chloride flush (NS) 0.9 % injection 3 mL, 3 mL, Intravenous, PRN, Cristescu, Mircea G, MD .  sodium chloride irrigation 0.9 % 50 mL, 50 mL, Per Tube, Q6H, Cristescu, Mircea G, MD, 50 mL at 07/01/19 1604    ALLERGIES   Amoxicillin    Review of Systems:  Gen:  Denies  fever, sweats, chills weight loss  HEENT: Denies blurred vision, double vision, ear pain, eye pain, hearing loss, nose bleeds, sore throat Cardiac:  No dizziness, chest pain or heaviness, chest tightness,edema, No JVD Resp:   No cough, -sputum production, -shortness of breath,-wheezing, -hemoptysis,  Gi: Denies swallowing difficulty, stomach pain, nausea or vomiting, diarrhea, constipation, bowel incontinence Gu:  Denies bladder incontinence, burning urine Ext:   Denies Joint pain, stiffness or swelling Skin: Denies  skin rash, easy bruising or bleeding or hives Endoc:  Denies polyuria, polydipsia , polyphagia or weight change Psych:   Denies depression, insomnia or hallucinations  Other:  All other systems negative  PHYSICAL EXAMINATION   Vital Signs: Temp:  [98 F (36.7 C)-98.6 F (37 C)] 98.4 F (36.9 C) (01/28 0300) Pulse Rate:  [64-94] 64 (01/28 0800) Resp:  [18-27] 20 (01/28 0800) BP: (93-145)/(58-93) 105/69 (01/28 0300) SpO2:  [95 %-99 %] 97 % (01/28 0800) FiO2 (%):  [32 %] 32 % (01/28 0407)  Physical Examination:   GENERAL:NAD, no fevers, chills, no weakness no fatigue HEAD: Normocephalic, atraumatic.  EYES: PERLA, EOMI No scleral icterus.  MOUTH: Moist mucosal membrane.  EAR, NOSE, THROAT: Clear without exudates. No external lesions. S/p trach  NECK: Supple. No thyromegaly.  No JVD.  PULMONARY: CTA B/L no wheezing, rhonchi, crackles CARDIOVASCULAR: S1 and S2. Regular rate and rhythm. No murmurs GASTROINTESTINAL:  Soft, nontender, nondistended. Positive bowel sounds.  MUSCULOSKELETAL: No swelling, clubbing, or edema.  NEUROLOGIC: No gross focal neurological deficits. 5/5 strength all extremities SKIN: No ulceration, lesions, rashes, or cyanosis.  PSYCHIATRIC: Insight, judgment intact. -depression -anxiety ALL OTHER ROS ARE NEGATIVE        PERTINENT DATA     Infusions: . sodium chloride    . sodium chloride Stopped (06/28/19 0532)  . sodium chloride Stopped (06/30/19 1048)  . cefTAZidime (FORTAZ)  IV 2 g (07/02/19 0522)   Scheduled Medications: . aspirin  81 mg Oral Daily  . budesonide  0.25 mg Nebulization BID  . Chlorhexidine Gluconate Cloth  6 each Topical Daily  . clonazePAM  1.5 mg Oral TID  . cloNIDine  0.1 mg Transdermal Weekly  . enoxaparin (LOVENOX) injection  40 mg Subcutaneous Q24H  . famotidine  20 mg Per Tube Daily  . fentaNYL  1 patch Transdermal Q72H  . fluticasone  1 spray Each Nare Daily  . insulin aspart  0-20 Units Subcutaneous TID WC  . insulin aspart  0-5 Units Subcutaneous QHS  . insulin aspart  10 Units Subcutaneous TID WC  . insulin detemir  30 Units Subcutaneous BID  . ipratropium-albuterol  3 mL Nebulization Q4H  . living well with diabetes book   Does not apply Once  . loratadine  10 mg Oral Daily  . Melatonin  5 mg Oral Q24H  . mirtazapine  30 mg Oral QHS  . oxyCODONE  5 mg Oral Q24H  . PARoxetine  20 mg Per Tube Daily  . pneumococcal 23 valent vaccine  0.5 mL Intramuscular Tomorrow-1000  . predniSONE  30 mg Oral Q breakfast  . QUEtiapine  100 mg Per Tube QHS  . senna-docusate  2 tablet Oral BID  . simvastatin  40 mg Oral QPM  . sodium chloride flush  3 mL Intravenous Q12H  . sodium chloride irrigation  50 mL Per Tube Q6H   PRN Medications: sodium chloride, alum & mag hydroxide-simeth, calcium carbonate, guaiFENesin-dextromethorphan, hydrALAZINE, ipratropium-albuterol, LORazepam, ondansetron (ZOFRAN) IV, ondansetron, oxyCODONE-acetaminophen,  polyethylene glycol, sodium chloride flush Hemodynamic parameters:   Intake/Output: 01/27 0701 - 01/28 0700 In: 603.8 [P.O.:480; IV Piggyback:123.8] Out: 600 [Urine:600]  Ventilator  Settings: Vent Mode: AC FiO2 (%):  [32 %] 32 % Set Rate:  [20 bmp] 20 bmp Vt Set:  [450 mL] 450 mL PEEP:  [5 cmH20] 5 cmH20    LAB RESULTS:  Basic Metabolic Panel: Recent Labs  Lab 06/28/19 0429 06/28/19 0429 06/29/19 0419 06/29/19 0419 06/30/19 0523 06/30/19 0523 07/01/19 0419 07/02/19 0838  NA 140  --  141  --  139  --  137 142  K 5.2*   < > 3.8   < > 2.9*   < > 3.4* 3.4*  CL 106  --  103  --  101  --  101 106  CO2 24  --  29  --  25  --  26 27  GLUCOSE 334*  --  300*  --  222*  --  276* 104*  BUN 12  --  17  --  29*  --  25* 25*  CREATININE 1.05*  --  1.00  --  1.68*  --  0.94 0.83  CALCIUM 9.1  --  8.7*  --  8.5*  --  8.7* 8.6*  MG 1.6*  --  1.8  --  1.6*  --  2.4 2.4  PHOS  --   --   --   --   --   --   --  3.7   < > = values in this interval not displayed.   Liver Function Tests: No results for input(s): AST, ALT, ALKPHOS, BILITOT, PROT, ALBUMIN in the last 168 hours. No results for input(s): LIPASE, AMYLASE in the last 168 hours. No results for input(s): AMMONIA in the last 168 hours. CBC: Recent Labs  Lab 06/27/19 0450 06/28/19 0429 06/29/19 0419 06/30/19 0523 07/01/19 0419  WBC 8.8 11.0* 11.4* 11.6* 7.3  NEUTROABS  --  9.3*  --   --   --  HGB 12.8 14.4 14.3 13.3 12.0  HCT 41.9 46.9* 46.5* 43.3 39.2  MCV 84.1 83.8 83.6 83.8 83.4  PLT 195 279 301 320 223   Cardiac Enzymes: No results for input(s): CKTOTAL, CKMB, CKMBINDEX, TROPONINI in the last 168 hours. BNP: Invalid input(s): POCBNP CBG: Recent Labs  Lab 07/01/19 0716 07/01/19 1136 07/01/19 1527 07/01/19 2101 07/02/19 0719  GLUCAP 297* 261* 274* 171* 115*     IMAGING RESULTS:  Imaging: No results found.    ASSESSMENT AND PLAN    SEVERE COPD EXACERBATION -continue IV steroids as  prescribed -continue NEB THERAPY as prescribed -chronic pseudomonas + respiratory cultures Continue IV ABX for next several; days   END STAGE HYPOXIC AND HYPERCAPNIC RESP FAILURE VENT DEPENDANT On home vent   INFECTIOUS DISEASE -continue antibiotics as prescribed -follow up cultures  ANXIETY MUCH IMPOVED PAIN MEDS AS NEEDED   GI GI PROPHYLAXIS as indicated  NUTRITIONAL STATUS DIET-->patient eats by mouth as requested by patient Constipation protocol as indicated  ELECTROLYTES -follow labs as needed -replace as needed -pharmacy consultation and following    DVT/GI PRX ordered TRANSFUSIONS AS NEEDED MONITOR FSBS ASSESS the need for LABS as needed    PLAN TO D/C HOME IN NEXT 24-48 HRS when IV ABX completed  Corrin Parker, M.D.  [facility]  Medical Director [place] Director [facility] Cardio-Pulmonary Department

## 2019-07-03 DIAGNOSIS — F418 Other specified anxiety disorders: Secondary | ICD-10-CM | POA: Diagnosis present

## 2019-07-03 DIAGNOSIS — G8929 Other chronic pain: Secondary | ICD-10-CM | POA: Diagnosis present

## 2019-07-03 DIAGNOSIS — I10 Essential (primary) hypertension: Secondary | ICD-10-CM

## 2019-07-03 DIAGNOSIS — J988 Other specified respiratory disorders: Secondary | ICD-10-CM | POA: Diagnosis present

## 2019-07-03 DIAGNOSIS — E1169 Type 2 diabetes mellitus with other specified complication: Secondary | ICD-10-CM | POA: Diagnosis present

## 2019-07-03 DIAGNOSIS — E876 Hypokalemia: Secondary | ICD-10-CM

## 2019-07-03 DIAGNOSIS — Z931 Gastrostomy status: Secondary | ICD-10-CM

## 2019-07-03 LAB — BASIC METABOLIC PANEL
Anion gap: 8 (ref 5–15)
BUN: 26 mg/dL — ABNORMAL HIGH (ref 6–20)
CO2: 25 mmol/L (ref 22–32)
Calcium: 8.3 mg/dL — ABNORMAL LOW (ref 8.9–10.3)
Chloride: 107 mmol/L (ref 98–111)
Creatinine, Ser: 0.69 mg/dL (ref 0.44–1.00)
GFR calc Af Amer: >60 mL/min (ref >60)
GFR calc non Af Amer: >60 mL/min (ref >60)
Glucose, Bld: 100 mg/dL — ABNORMAL HIGH (ref 70–99)
Potassium: 3.6 mmol/L (ref 3.5–5.1)
Sodium: 140 mmol/L (ref 135–145)

## 2019-07-03 LAB — MAGNESIUM: Magnesium: 2.2 mg/dL (ref 1.7–2.4)

## 2019-07-03 LAB — GLUCOSE, CAPILLARY
Glucose-Capillary: 93 mg/dL (ref 70–99)
Glucose-Capillary: 145 mg/dL — ABNORMAL HIGH (ref 70–99)
Glucose-Capillary: 221 mg/dL — ABNORMAL HIGH (ref 70–99)
Glucose-Capillary: 145 mg/dL — ABNORMAL HIGH (ref 70–99)

## 2019-07-03 MED ORDER — MELATONIN 5 MG PO TABS: 5.0000 mg | ORAL_TABLET | ORAL | 0 refills | Status: DC

## 2019-07-03 MED ORDER — SIMETHICONE 80 MG PO CHEW
80.0000 mg | CHEWABLE_TABLET | Freq: Four times a day (QID) | ORAL | Status: DC | PRN
  Administered 2019-07-03: 17:00:00 80 mg via ORAL
  Filled 2019-07-03 (×3): qty 1

## 2019-07-03 MED ORDER — INSULIN DETEMIR 100 UNIT/ML ~~LOC~~ SOLN
25.0000 [IU] | Freq: Two times a day (BID) | SUBCUTANEOUS | Status: DC
  Administered 2019-07-03 – 2019-07-05 (×5): 25 [IU] via SUBCUTANEOUS
  Filled 2019-07-03 (×7): qty 0.25

## 2019-07-03 MED ORDER — INSULIN ASPART 100 UNIT/ML ~~LOC~~ SOLN: 10.0000 [IU] | Freq: Three times a day (TID) | SUBCUTANEOUS | 11 refills | Status: DC

## 2019-07-03 MED ORDER — ENSURE MAX PROTEIN PO LIQD: 11.0000 [oz_av] | Freq: Two times a day (BID) | ORAL | Status: DC

## 2019-07-03 MED ORDER — CLONIDINE 0.1 MG/24HR TD PTWK: 0.1000 mg | MEDICATED_PATCH | TRANSDERMAL | 12 refills | Status: DC

## 2019-07-03 MED ORDER — GUAIFENESIN-DM 100-10 MG/5ML PO SYRP: 10.0000 mL | ORAL_SOLUTION | Freq: Four times a day (QID) | ORAL | 0 refills | Status: DC | PRN

## 2019-07-03 MED ORDER — INSULIN ASPART 100 UNIT/ML ~~LOC~~ SOLN: 0.0000 [IU] | Freq: Three times a day (TID) | SUBCUTANEOUS | 11 refills | Status: DC

## 2019-07-03 MED ORDER — CLONAZEPAM 0.5 MG PO TABS: 1.5000 mg | ORAL_TABLET | Freq: Three times a day (TID) | ORAL | 0 refills | Status: DC

## 2019-07-03 MED ORDER — LIVING WELL WITH DIABETES BOOK: 1.0000 | Freq: Once | Status: AC

## 2019-07-03 MED ORDER — INSULIN DETEMIR 100 UNIT/ML ~~LOC~~ SOLN: 25.0000 [IU] | Freq: Two times a day (BID) | SUBCUTANEOUS | 11 refills | Status: DC

## 2019-07-03 MED ORDER — QUETIAPINE FUMARATE 100 MG PO TABS: 100.0000 mg | ORAL_TABLET | Freq: Every day | ORAL | 1 refills | Status: DC

## 2019-07-03 NOTE — Consult Note (Addendum)
PHARMACY CONSULT NOTE - FOLLOW UP  Pharmacy Consult for Electrolyte Monitoring and Replacement   Recent Labs: Potassium (mmol/L)  Date Value  07/03/2019 3.6  07/11/2014 4.6   Magnesium (mg/dL)  Date Value  07/03/2019 2.2  10/31/2013 2.6 (H)   Calcium (mg/dL)  Date Value  07/03/2019 8.3 (L)   Calcium, Total (mg/dL)  Date Value  07/11/2014 8.9   Albumin (g/dL)  Date Value  06/25/2019 3.6  07/09/2014 3.7   Phosphorus (mg/dL)  Date Value  07/02/2019 3.7  10/31/2013 4.6   Sodium (mmol/L)  Date Value  07/03/2019 140  07/11/2014 139     Assessment: Pharmacy has been consulted to manage electrolytes for this 59 year-old female admitted with end stage COPD exacerbation and ventilator dependent chronic hypercapnic respiratory failure.  PMH is significant for COPD, CHF, CAD, hyperlipidemia, hypertension, and tracheostomy.  Patient has somewhat been refusing POs.  Electrolytes Goals of therapy given cardiac history: K ~4 Mag ~2 All other electrolytes WNL. K increased from 3.4 to 3.6 after KCl 40 mEq x 1 dose.  Patient refused bedtime dose, but took 40 mEq x 1 dose this morning, which should place her at goal.    Pharmacy will sign off at this time.    Glucose Hemoglobin A1c 11.8.  SBG ranging 93-187 on prednisone.  Prednisone will stop tomorrow (1/30), and expect slight downtrend in SBG.  Patient currently receiving resistant SSI TIDWM, Novolog 10 units TIDWM, Novolog 0-5 units QHS, and levemir 30 units BID.  As steroid is being stopped, will lower levemir to 25 units BID.  Reassess SBG trend tomorrow.  Constipation Last BM was 1/24.  Patient currently receiving scheduled oxycodone and PRN percocet while on fentanyl patch.  Current orders include scheduled senna-docusate and scheduled miralax.  Patient is currently refusing both.  Will continue to follow with response.    Gerald Dexter ,PharmD 07/03/2019 11:15 AM

## 2019-07-03 NOTE — Progress Notes (Addendum)
PROGRESS NOTE    Michelle [name]  [id number] DOB: 04/06/1961 DOA: 06/24/2019  PCP: Frazier Richards, MD    LOS - 9   Brief Narrative:  Michelle [name] a 59 y.o.femalewith medical history significant ofCOPD, CHF, hyperlipidemia, coronary artery disease, hypertension, hypercholesterolemia, status post tracheostomy and vent-dependent, came with a chief complaint of shortness of breath with yellow productive sputum. She is on 4 L of oxygen all the time.  In the ED, patient was in mild respiratory distress.  Chest xray showed lower lobe infiltrate.  Labs notable for glucose 563, normal anion gap, no leukocytosis.  Tracheal aspirate culture obtained and growing Pseudomonas, being treated with ceftazidime.  Pulmonary following.  Subjective 1/29: Patient reports feeling better today.  Anxiety well controlled, no panic attacks.  Does report thick sputum when suctioned.  No difficulty breathing or chest pain.  No nausea/vomiting/diarrhea.  No acute events reported.   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 Presented with increased cough, sputum production, and wheezing.  --PCCM following, will follow their recommendations --DuoNebs,  Pulmicort --Chest physiotherapy with MetaNeb --Titrate oxygen keep saturation more than 88% --continue steroid taper --continue ceftazidime, per pulm needs at least a few more days IV abx  CAP, ruled out --Started on IV Levaquin on admission. Procal neg. Trach aspirate grew pseudomonas today which is likely chronic colonization (past 2-3 aspirates cx all grew pseudomonas)  Hyperglycemia New onset diabetes mellitus type 2 Hemoglobin A1c 11.8 --continueLevemir 30 units BID --Novolog 10 units TID AC --resistant sliding scale Novolog  Hypokalemia and Hyperkalemia- resolved --monitor BMP  Chronic pain medication --Continue fentanyl and oxycodone  Hypercholesterolemia --continueZocor  Coronary artery disease --continue homeAspirin,Zocor  Status post PEG tube --Flushes normal saline every 6 hours  Dysphagia --Per family patient can have soft diet  Anxiety and depression --continue home Remeron, paxil --Psych consulttodayto help manage her anxiety --change Klonopin 2 mg twice daily to Klonopin 1.5 mg 3 times daily, per psych rec --increase Seroquel to 100 mg p.o. nightly   FEN: tube feed and soft diet   DVT prophylaxis:Lovenox Code Status: DNR Family Communication:none at bedside Disposition Plan:Expect d/c home pending further improvement and clearance by PCCM.  Patient continues to require IV antibiotics and close monitoring in the hospital.   Consultants:  PCCM   Antimicrobials:  Ceftazidime 1/27 >>>   Objective: Vitals:   07/03/19 0900 07/03/19 1000 07/03/19 1115 07/03/19 1116  BP: 102/64 108/71    Pulse: 76 70    Resp: 20 20    Temp:      TempSrc:      SpO2: 95% 96% 97% 97%  Weight:      Height:        Intake/Output Summary (Last 24 hours) at 07/03/2019 1359 Last data filed at 07/03/2019 0800 Gross per 24 hour  Intake  270 ml  Output 1350 ml  Net -1080 ml   Filed Weights   06/24/19 1936 06/25/19 1343    Weight: 72.6 kg 89.5 kg    Examination:  General exam: awake, alert, no acute distress, obese HEENT: moist mucus membranes, hearing grossly normal, trach in place without visible secretions Respiratory system: clear to auscultation bilaterally, no wheezes, rales or rhonchi, normal respiratory effort. Cardiovascular system: normal S1/S2, RRR, trace LE edema.   Gastrointestinal system: soft, non-tender, non-distended abdomen Central nervous system: alert and oriented x3. no gross focal neurologic deficits, normal speech Extremities: moves all, no cyanosis, normal tone Psychiatry: normal mood, congruent affect, judgement and insight appear normal    Data Reviewed: I have personally reviewed following labs and imaging studies  CBC: Recent Labs  Lab 06/27/19 0450 06/28/19 0429 06/29/19 0419 06/30/19 0523 07/01/19 0419  WBC 8.8 11.0* 11.4* 11.6* 7.3  NEUTROABS  --  9.3*  --   --   --   HGB 12.8 14.4 14.3 13.3 12.0  HCT 41.9 46.9* 46.5* 43.3 39.2  MCV 84.1 83.8 83.6 83.8 83.4  PLT 195 279 301 320 [id number]   Basic Metabolic Panel: Recent Labs  Lab 06/29/19 0419 06/30/19 0523 07/01/19 0419 07/02/19 0838 07/03/19 0528  NA 141 139 137 142 140  K 3.8 2.9* 3.4* 3.4* 3.6  CL 103 101 101 106 107  CO2 29 25 26 27 25   GLUCOSE 300* 222* 276* 104* 100*  BUN 17 29* 25* 25* 26*  CREATININE 1.00 1.68* 0.94 0.83 0.69  CALCIUM 8.7* 8.5* 8.7* 8.6* 8.3*  MG 1.8 1.6* 2.4 2.4 2.2  PHOS  --   --   --  3.7  --    GFR: Estimated Creatinine Clearance: 76.4 mL/min (by C-G formula based on SCr of 0.69 mg/dL). Liver Function Tests: No results for input(s): AST, ALT, ALKPHOS, BILITOT, PROT, ALBUMIN in the last 168 hours. No results for input(s): LIPASE, AMYLASE in the last 168 hours. No results for input(s): AMMONIA in the last 168 hours. Coagulation Profile: No results for input(s): INR, PROTIME in the last 168 hours. Cardiac Enzymes: No results for input(s): CKTOTAL, CKMB, CKMBINDEX,  TROPONINI in the last 168 hours. BNP (last 3 results) No results for input(s): PROBNP in the last 8760 hours. HbA1C: No results for input(s): HGBA1C in the last 72 hours. CBG: Recent Labs  Lab 07/02/19 1103 07/02/19 1608 07/02/19 2151 07/03/19 0730 07/03/19 1133  GLUCAP 140* 146* 187* 93 145*   Lipid Profile: No results for input(s): CHOL, HDL, LDLCALC, TRIG, CHOLHDL, LDLDIRECT in the last 72 hours. Thyroid Function Tests: No results for input(s): TSH, T4TOTAL, FREET4, T3FREE, THYROIDAB in the last 72 hours. Anemia Panel: No results for input(s): VITAMINB12, FOLATE, FERRITIN, TIBC, IRON, RETICCTPCT in the last 72 hours. Sepsis Labs: Recent Labs  Lab 06/27/19 1630 06/27/19 1849 06/29/19 0419 06/30/19 0523 07/01/19 0419  PROCALCITON  --   --  0.40 0.92 0.48  LATICACIDVEN 2.4* 2.3*  --   --   --     Recent Results (from the past 240 hour(s))  Culture, respiratory     Status: None   Collection Time: 06/24/19  7:44 PM   Specimen: Tracheal Aspirate  Result Value Ref Range Status   Specimen Description   Final    TRACHEAL ASPIRATE Performed at [facility], [address], [place], [place] [address]    Special Requests   Final    NONE Performed at [facility], [place],  [place], [place] [address]    Gram Stain   Final    RARE WBC PRESENT,BOTH PMN AND MONONUCLEAR ABUNDANT GRAM NEGATIVE RODS RARE GRAM POSITIVE RODS RARE GRAM POSITIVE COCCI IN PAIRS RARE BUDDING YEAST SEEN Performed at [facility], [address], [place], [place] [address]    Culture ABUNDANT PSEUDOMONAS AERUGINOSA  Final   Report Status 06/27/2019 FINAL  Final   Organism ID, Bacteria PSEUDOMONAS AERUGINOSA  Final      Susceptibility   Pseudomonas aeruginosa - MIC*    CEFTAZIDIME 4 SENSITIVE Sensitive     CIPROFLOXACIN >=4 RESISTANT Resistant     GENTAMICIN 8 INTERMEDIATE Intermediate     IMIPENEM 2 SENSITIVE Sensitive     PIP/TAZO 8 SENSITIVE Sensitive     *  ABUNDANT PSEUDOMONAS AERUGINOSA  SARS CORONAVIRUS 2 (TAT 6-24 HRS) Nasopharyngeal Nasopharyngeal Swab     Status: None   Collection Time: 06/24/19  7:45 PM   Specimen: Nasopharyngeal Swab  Result Value Ref Range Status   SARS Coronavirus 2 NEGATIVE NEGATIVE Final    Comment: (NOTE) SARS-CoV-2 target nucleic acids are NOT DETECTED. The SARS-CoV-2 RNA is generally detectable in upper and lower respiratory specimens during the acute phase of infection. Negative results do not preclude SARS-CoV-2 infection, do not rule out co-infections with other pathogens, and should not be used as the sole basis for treatment or other patient management decisions. Negative results must be combined with clinical observations, patient history, and epidemiological information. The expected result is Negative. Fact Sheet for Patients: SugarRoll.be Fact Sheet for Healthcare Providers: https://www.woods-mathews.com/ This test is not yet approved or cleared by the [place] FDA and  has been authorized for detection and/or diagnosis of SARS-CoV-2 by FDA under an Emergency Use Authorization (EUA). This EUA will remain  in effect (meaning this test can be used) for the duration of the COVID-19 declaration under Section 56 4(b)(1) of the Act, 21 U.S.C. section 360bbb-3(b)(1), unless the authorization is terminated or revoked sooner. Performed at [facility], [place] [address] [place], [place] [address]   MRSA PCR Screening     Status: Abnormal   Collection Time: 06/25/19  1:40 PM   Specimen: Nasal Mucosa; Nasopharyngeal  Result Value Ref Range Status   MRSA by PCR POSITIVE (A) NEGATIVE Final    Comment:        The GeneXpert MRSA Assay (FDA approved for NASAL specimens only), is one component of a comprehensive MRSA colonization surveillance program. It is not intended to diagnose MRSA infection nor to guide or monitor treatment for MRSA  infections. RESULT CALLED TO, READ BACK BY AND VERIFIED WITH: Adalberto Ill 06/25/19 at 1516. CST Performed at [facility], [address] [place], [place] [address]   Culture, respiratory (non-expectorated)     Status: None   Collection Time: 06/27/19  8:53 PM   Specimen: Tracheal Aspirate; Respiratory  Result Value Ref Range Status   Specimen Description   Final    TRACHEAL ASPIRATE Performed at [facility], [address] [place], [place] [address]    Special Requests   Final    NONE Performed at [facility], [address] [place], [place] [address]    Gram Stain   Final    ABUNDANT WBC PRESENT, PREDOMINANTLY PMN ABUNDANT GRAM NEGATIVE RODS RARE GRAM POSITIVE RODS RARE YEAST RARE GRAM POSITIVE COCCI Performed at [facility], [place] [address] [place], [place] [address]    Culture   Final  ABUNDANT PSEUDOMONAS AERUGINOSA ABUNDANT SERRATIA MARCESCENS    Report Status 07/02/2019 FINAL  Final   Organism ID, Bacteria PSEUDOMONAS AERUGINOSA  Final   Organism ID, Bacteria SERRATIA MARCESCENS  Final      Susceptibility   Pseudomonas aeruginosa - MIC*    CEFTAZIDIME 8 SENSITIVE Sensitive     CIPROFLOXACIN >=4 RESISTANT Resistant     GENTAMICIN <=1 SENSITIVE Sensitive     IMIPENEM 2 SENSITIVE Sensitive     PIP/TAZO 8 SENSITIVE Sensitive     * ABUNDANT PSEUDOMONAS AERUGINOSA   Serratia marcescens - MIC*    CEFAZOLIN >=64 RESISTANT Resistant     CEFEPIME <=0.12 SENSITIVE Sensitive     CEFTAZIDIME <=1 SENSITIVE Sensitive     CEFTRIAXONE <=0.25 SENSITIVE Sensitive     CIPROFLOXACIN 0.5 SENSITIVE Sensitive     GENTAMICIN <=1 SENSITIVE Sensitive     TRIMETH/SULFA <=20 SENSITIVE Sensitive     * ABUNDANT SERRATIA MARCESCENS         Radiology Studies: No results found.      Scheduled Meds: . aspirin  81 mg Oral Daily  . budesonide  0.25 mg Nebulization BID  . Chlorhexidine Gluconate Cloth  6 each Topical Daily  . clonazePAM   1.5 mg Oral TID  . cloNIDine  0.1 mg Transdermal Weekly  . enoxaparin (LOVENOX) injection  40 mg Subcutaneous Q24H  . famotidine  20 mg Per Tube Daily  . fentaNYL  1 patch Transdermal Q72H  . fluticasone  1 spray Each Nare Daily  . insulin aspart  0-20 Units Subcutaneous TID WC  . insulin aspart  0-5 Units Subcutaneous QHS  . insulin aspart  10 Units Subcutaneous TID WC  . insulin detemir  25 Units Subcutaneous BID  . ipratropium-albuterol  3 mL Nebulization Q4H  . living well with diabetes book   Does not apply Once  . loratadine  10 mg Oral Daily  . Melatonin  5 mg Oral Q24H  . mirtazapine  30 mg Oral QHS  . oxyCODONE  5 mg Oral Q24H  . PARoxetine  20 mg Per Tube Daily  . pneumococcal 23 valent vaccine  0.5 mL Intramuscular Tomorrow-1000  . polyethylene glycol  17 g Oral Daily  . predniSONE  30 mg Oral Q breakfast  . Ensure Max Protein  11 oz Oral BID  . QUEtiapine  100 mg Per Tube QHS  . senna-docusate  2 tablet Oral BID  . simvastatin  40 mg Oral QPM  . sodium chloride flush  3 mL Intravenous Q12H  . sodium chloride irrigation  50 mL Per Tube Q6H   Continuous Infusions: . sodium chloride    . sodium chloride Stopped (06/28/19 0532)  . sodium chloride Stopped (06/30/19 1048)  . cefTAZidime (FORTAZ)  IV 2 g (07/03/19 0542)     LOS: 9 days    Time spent: 25 minutes    Ezekiel Slocumb, DO [facility]   If 7PM-7AM, please contact night-coverage [email or web address] 07/03/2019, 1:59 PM

## 2019-07-03 NOTE — Progress Notes (Signed)
Pt. motioned to [facility] as he passed pt.'s rm while rounding in ICU; [place] has visited pt. several times; pt. reports 'feeling much better' today and anticipates discharge tomorrow; medical team still working to clear infection.  Pt. expressed gratitude for [facility] presence and care of medical team.  [place] remains available as needed.       07/03/19 1515  Clinical Encounter Type  Visited With Patient  Visit Type Follow-up;Social support;Spiritual support  Referral From Patient  Spiritual Encounters  Spiritual Needs Emotional

## 2019-07-03 NOTE — Progress Notes (Signed)
   07/03/19 1600  Clinical Encounter Type  Visited With Patient  Visit Type Initial  Referral From Chaplain  Consult/Referral To [place] visited with patient. Chaplain briefly spoke to patient, who was pleasant to be with. Chaplain offered pastoral presence and empathy.

## 2019-07-03 NOTE — Progress Notes (Signed)
Pharmacy Antibiotic Note  Michelle [name] is a 59 y.o. female admitted on 06/24/2019 with mild SOB and yellow productive sputum.  Pt had COPD exacerbation and acute on chronic respiratory failure with hypoxemia.  PMH includes end stage COPD, CHF, HLD, CAD, HTN, hypercholesterolemia, ventilator-dependent tracheostomy.  Respiratory cultures (tracheal aspirate) indicate growth of Pseudomonas aurginosa and Serratia Marcescens, both of which are sensitive to ceftazidime.  Pharmacy has been consulted for Ceftazidime dosing.  She is currently on day 4/5 of ceftazidime.  Plan: Continue ceftazidime 2g IV q8 hours until 1/30.  Serum creatinine appears to be improving and stabilizing.    Height: 5' (152.4 cm) Weight: 197 lb 5 oz (89.5 kg) IBW/kg (Calculated) : 45.5  Temp (24hrs), Avg:98.5 F (36.9 C), Min:98.2 F (36.8 C), Max:98.7 F (37.1 C)  Recent Labs  Lab 06/27/19 0450 06/27/19 0932 06/27/19 1630 06/27/19 1849 06/28/19 0429 06/28/19 0429 06/29/19 0419 06/30/19 0523 07/01/19 0419 07/02/19 0838 07/03/19 0528  WBC 8.8  --   --   --  11.0*  --  11.4* 11.6* 7.3  --   --   CREATININE  --    < >  --   --  1.05*   < > 1.00 1.68* 0.94 0.83 0.69  LATICACIDVEN  --   --  2.4* 2.3*  --   --   --   --   --   --   --    < > = values in this interval not displayed.    Estimated Creatinine Clearance: 76.4 mL/min (by C-G formula based on SCr of 0.69 mg/dL).    Allergies  Allergen Reactions  . Amoxicillin     Patient has tolerated cephalosporins in the past    Antimicrobials this admission: Levaquin 01/20 >> 01/22 Tobramycin 300mg  (TOBI) 01/24 >> 01/26 Ceftazidime 1g Q8H 01/25 >> 01/26 Ceftazidime 2g Q12H 01/26 >> 01/30  Dose adjustments this admission: 01/26 Ceftazidime adjusted to 2g Q12hr.  01/27 Ceftazidime adjusted to 2g Q8hr.  Microbiology results:  01/20 Respiratory Culture (tracheal aspirate): Pseudomonas , finalized  01/23 Respiratory Culture: Pseudomonas aeruginosa, Serratia  marcescens, finalized 01/21 MRSA PCR: Positive 01/20 SARS Coronavirus 2 (Swab): Negative   Thank you for allowing pharmacy to be a part of this patient's care.  Gerald Dexter, PharmD 07/03/2019 3:04 PM

## 2019-07-03 NOTE — Progress Notes (Signed)
Dr. Mortimer Fries has been notified of pt HR on the 140s and BP on the 200s.PRN percocet and 1800 oxycodone dose has been given. Pt said that she was gassy simethicone has been given. Pt refused her PRN IV ativan she said that it makes her worst.

## 2019-07-04 DIAGNOSIS — E119 Type 2 diabetes mellitus without complications: Secondary | ICD-10-CM

## 2019-07-04 DIAGNOSIS — J988 Other specified respiratory disorders: Secondary | ICD-10-CM

## 2019-07-04 DIAGNOSIS — B965 Pseudomonas (aeruginosa) (mallei) (pseudomallei) as the cause of diseases classified elsewhere: Secondary | ICD-10-CM

## 2019-07-04 LAB — GLUCOSE, CAPILLARY
Glucose-Capillary: 81 mg/dL (ref 70–99)
Glucose-Capillary: 164 mg/dL — ABNORMAL HIGH (ref 70–99)
Glucose-Capillary: 155 mg/dL — ABNORMAL HIGH (ref 70–99)
Glucose-Capillary: 198 mg/dL — ABNORMAL HIGH (ref 70–99)

## 2019-07-04 NOTE — Progress Notes (Signed)
PROGRESS NOTE    Michelle [name]  [id number] DOB: Mar 14, 1961 DOA: 06/24/2019  PCP: Frazier Richards, MD    LOS - 10   Brief Narrative:  Michelle [name] a 59 y.o.femalewith medical history significant ofCOPD, CHF, hyperlipidemia, coronary artery disease, hypertension, hypercholesterolemia, status post tracheostomyand vent-dependent, came with a chief complaint of shortness of breath with yellow productive sputum. She is on 4 L of oxygen all the time.In the ED, patient was in mild respiratory distress. Chest xray showed lower lobe infiltrate. Labs notable for glucose 563, normal anion gap, no leukocytosis. Tracheal aspirate culture obtained and growing Pseudomonas, being treated with ceftazidime. Pulmonary following.  1/29 PM - 1/30 with uncontrolled blood pressures.  BP as hight as 248/125.  Still elevated this AM, but controlled this afternoon.  Possibly due to pain and/or anxiety.  Subjective 1/30: Patient reports feeling well this AM.  Per report, patient has not had BM in several days but was refusing meds for this.  She reports feeling anxious this Am, requesting Klonopin.  Denies F/C, CP, SOB, N/V/D or other complaints.  No acute events reported other than BP issues.  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 Presented with increased cough, sputum production, and wheezing.  --PCCM following, will follow their recommendations --DuoNebs, Pulmicort --Chest physiotherapy with MetaNeb --Titrate oxygen keep saturation more than 88% --continue steroid taper --continue ceftazidime, per pulm needs at least a few more days IV abx  CAP, ruled out --Started on IV Levaquin on admission. Procal neg. Trach aspirate grew pseudomonas today which is likely chronic colonization (past 2-3 aspirates cx all grew pseudomonas)  Hyperglycemia New onset diabetes mellitus type 2 Hemoglobin A1c 11.8 --continueLevemir 30 units BID --Novolog 10 units TID AC --resistant sliding scale Novolog  Hypokalemia and Hyperkalemia- resolved --monitor BMP  Chronic pain medication --Continue fentanyl and oxycodone  Hypercholesterolemia --continueZocor  Coronary artery disease --continue homeAspirin,Zocor  Status post PEG tube --Flushes normal saline every 6 hours  Dysphagia --Per family patient can have soft diet  Anxiety and depression --continue home Remeron, paxil --Psych consulttodayto help manage her anxiety --change Klonopin 2 mg twice daily to Klonopin 1.5 mg 3 times daily, per psych rec --increase Seroquel to 100 mg p.o. nightly   FEN: tube feed and soft diet   DVT prophylaxis:Lovenox Code Status: DNR Family Communication:none at bedside Disposition Plan:Expect d/c home pending further improvement and clearance by PCCM. Patient continues to require IV antibiotics and close monitoring in the hospital.   Consultants:  PCCM   Antimicrobials:  Ceftazidime 1/25 >>>1/29  Tobra nebs  Levaquin 1/20 >>> 1/21    Objective: Vitals:   07/04/19 1200 07/04/19 1213 07/04/19 1300 07/04/19 1400  BP: 129/88  129/82 120/89  Pulse: 80  81 80  Resp: 20  20 20   Temp:  98.4 F (  36.9 C)     TempSrc: Oral     SpO2: 98% 98% 95% 97%  Weight:      Height:        Intake/Output Summary (Last 24 hours) at 07/04/2019 1634 Last data filed at 07/04/2019 1200 Gross per 24 hour  Intake 580 ml  Output 1200 ml  Net -620 ml   Filed Weights   06/24/19 1936 06/25/19 1343  Weight: 72.6 kg 89.5 kg    Examination:  General exam: awake, alert, no acute distress, obese HEENT: trach present without visible secretion, moist mucus membranes, hearing grossly normal  Respiratory system: upper airway secretion sounds, no wheezes, rales or rhonchi, normal respiratory effort. Cardiovascular system: normal S1/S2, RRR, nonpitting Le edema.   Central nervous system: alert and oriented x3. no gross focal neurologic deficits, normal speech Extremities: moves all, no cyanosis, normal tone Psychiatry: normal mood, congruent affect, judgement and insight appear normal    Data Reviewed: I have personally reviewed following labs and imaging studies  CBC: Recent Labs  Lab 06/28/19 0429 06/29/19 0419 06/30/19 0523 07/01/19 0419  WBC 11.0* 11.4* 11.6* 7.3  NEUTROABS 9.3*  --   --   --   HGB 14.4 14.3 13.3 12.0  HCT 46.9* 46.5* 43.3 39.2  MCV 83.8 83.6 83.8 83.4  PLT 279 301 320 [id number]   Basic Metabolic Panel: Recent Labs  Lab 06/29/19 0419 06/30/19 0523 07/01/19 0419 07/02/19 0838 07/03/19 0528  NA 141 139 137 142 140  K 3.8 2.9* 3.4* 3.4* 3.6  CL 103 101 101 106 107  CO2 29 25 26 27 25   GLUCOSE 300* 222* 276* 104* 100*  BUN 17 29* 25* 25* 26*  CREATININE 1.00 1.68* 0.94 0.83 0.69  CALCIUM 8.7* 8.5* 8.7* 8.6* 8.3*  MG 1.8 1.6* 2.4 2.4 2.2  PHOS  --   --   --  3.7  --    GFR: Estimated Creatinine Clearance: 76.4 mL/min (by C-G formula based on SCr of 0.69 mg/dL). Liver Function Tests: No results for input(s): AST, ALT, ALKPHOS, BILITOT, PROT, ALBUMIN in the last 168 hours. No results for input(s): LIPASE, AMYLASE in the last 168 hours. No results for  input(s): AMMONIA in the last 168 hours. Coagulation Profile: No results for input(s): INR, PROTIME in the last 168 hours. Cardiac Enzymes: No results for input(s): CKTOTAL, CKMB, CKMBINDEX, TROPONINI in the last 168 hours. BNP (last 3 results) No results for input(s): PROBNP in the last 8760 hours. HbA1C: No results for input(s): HGBA1C in the last 72 hours. CBG: Recent Labs  Lab 07/03/19 1133 07/03/19 1604 07/03/19 2136 07/04/19 0744 07/04/19 1138  GLUCAP 145* 221* 145* 81 164*   Lipid Profile: No results for input(s): CHOL, HDL, LDLCALC, TRIG, CHOLHDL, LDLDIRECT in the last 72 hours. Thyroid Function Tests: No results for input(s): TSH, T4TOTAL, FREET4, T3FREE, THYROIDAB in the last 72 hours. Anemia Panel: No results for input(s): VITAMINB12, FOLATE, FERRITIN, TIBC, IRON, RETICCTPCT in the last 72 hours. Sepsis Labs: Recent Labs  Lab 06/27/19 1849 06/29/19 0419 06/30/19 0523 07/01/19 0419  PROCALCITON  --  0.40 0.92 0.48  LATICACIDVEN 2.3*  --   --   --     Recent Results (from the past 240 hour(s))  Culture, respiratory     Status: None   Collection Time: 06/24/19  7:44 PM   Specimen: Tracheal Aspirate  Result Value Ref Range Status   Specimen Description   Final    TRACHEAL ASPIRATE Performed at [facility],  [place], [place] [address]    Special Requests   Final    NONE Performed at [facility], [place], [place] [address]    Gram Stain   Final    RARE WBC PRESENT,BOTH PMN AND MONONUCLEAR ABUNDANT GRAM NEGATIVE RODS RARE GRAM POSITIVE RODS RARE GRAM POSITIVE COCCI IN PAIRS RARE BUDDING YEAST SEEN Performed at [facility], [place] [address] [place], [place] [address]    Culture ABUNDANT PSEUDOMONAS AERUGINOSA  Final   Report Status 06/27/2019 FINAL  Final   Organism ID, Bacteria PSEUDOMONAS AERUGINOSA  Final      Susceptibility   Pseudomonas aeruginosa - MIC*    CEFTAZIDIME 4 SENSITIVE  Sensitive     CIPROFLOXACIN >=4 RESISTANT Resistant     GENTAMICIN 8 INTERMEDIATE Intermediate     IMIPENEM 2 SENSITIVE Sensitive     PIP/TAZO 8 SENSITIVE Sensitive     * ABUNDANT PSEUDOMONAS AERUGINOSA  SARS CORONAVIRUS 2 (TAT 6-24 HRS) Nasopharyngeal Nasopharyngeal Swab     Status: None   Collection Time: 06/24/19  7:45 PM   Specimen: Nasopharyngeal Swab  Result Value Ref Range Status   SARS Coronavirus 2 NEGATIVE NEGATIVE Final    Comment: (NOTE) SARS-CoV-2 target nucleic acids are NOT DETECTED. The SARS-CoV-2 RNA is generally detectable in upper and lower respiratory specimens during the acute phase of infection. Negative results do not preclude SARS-CoV-2 infection, do not rule out co-infections with other pathogens, and should not be used as the sole basis for treatment or other patient management decisions. Negative results must be combined with clinical observations, patient history, and epidemiological information. The expected result is Negative. Fact Sheet for Patients: SugarRoll.be Fact Sheet for Healthcare Providers: https://www.woods-mathews.com/ This test is not yet approved or cleared by the [place] FDA and  has been authorized for detection and/or diagnosis of SARS-CoV-2 by FDA under an Emergency Use Authorization (EUA). This EUA will remain  in effect (meaning this test can be used) for the duration of the COVID-19 declaration under Section 56 4(b)(1) of the Act, 21 U.S.C. section 360bbb-3(b)(1), unless the authorization is terminated or revoked sooner. Performed at [facility], [place] [address] [place], [place] [address]   MRSA PCR Screening     Status: Abnormal   Collection Time: 06/25/19  1:40 PM   Specimen: Nasal Mucosa; Nasopharyngeal  Result Value Ref Range Status   MRSA by PCR POSITIVE (A) NEGATIVE Final    Comment:        The GeneXpert MRSA Assay (FDA approved for NASAL specimens only), is one  component of a comprehensive MRSA colonization surveillance program. It is not intended to diagnose MRSA infection nor to guide or monitor treatment for MRSA infections. RESULT CALLED TO, READ BACK BY AND VERIFIED WITH: Adalberto Ill 06/25/19 at 1516. CST Performed at [facility], [address] [place], [place] [address]   Culture, respiratory (non-expectorated)     Status: None   Collection Time: 06/27/19  8:53 PM   Specimen: Tracheal Aspirate; Respiratory  Result Value Ref Range Status   Specimen Description   Final    TRACHEAL ASPIRATE Performed at [facility], [address], [place], [place] [address]    Special Requests   Final    NONE Performed at [facility], [address] [place], [place] [address]    Gram Stain   Final    ABUNDANT WBC PRESENT, PREDOMINANTLY PMN ABUNDANT GRAM NEGATIVE RODS RARE GRAM POSITIVE  RODS RARE YEAST RARE GRAM POSITIVE COCCI Performed at [facility], [place] [address] [place], [place] [address]    Culture   Final    ABUNDANT PSEUDOMONAS AERUGINOSA ABUNDANT SERRATIA MARCESCENS    Report Status 07/02/2019 FINAL  Final   Organism ID, Bacteria PSEUDOMONAS AERUGINOSA  Final   Organism ID, Bacteria SERRATIA MARCESCENS  Final      Susceptibility   Pseudomonas aeruginosa - MIC*    CEFTAZIDIME 8 SENSITIVE Sensitive     CIPROFLOXACIN >=4 RESISTANT Resistant     GENTAMICIN <=1 SENSITIVE Sensitive     IMIPENEM 2 SENSITIVE Sensitive     PIP/TAZO 8 SENSITIVE Sensitive     * ABUNDANT PSEUDOMONAS AERUGINOSA   Serratia marcescens - MIC*    CEFAZOLIN >=64 RESISTANT Resistant     CEFEPIME <=0.12 SENSITIVE Sensitive     CEFTAZIDIME <=1 SENSITIVE Sensitive     CEFTRIAXONE <=0.25 SENSITIVE Sensitive     CIPROFLOXACIN 0.5 SENSITIVE Sensitive     GENTAMICIN <=1 SENSITIVE Sensitive     TRIMETH/SULFA <=20 SENSITIVE Sensitive     * ABUNDANT SERRATIA MARCESCENS         Radiology Studies: No results found.       Scheduled Meds: . aspirin  81 mg Oral Daily  . budesonide  0.25 mg Nebulization BID  . Chlorhexidine Gluconate Cloth  6 each Topical Daily  . clonazePAM  1.5 mg Oral TID  . cloNIDine  0.1 mg Transdermal Weekly  . enoxaparin (LOVENOX) injection  40 mg Subcutaneous Q24H  . famotidine  20 mg Per Tube Daily  . fentaNYL  1 patch Transdermal Q72H  . fluticasone  1 spray Each Nare Daily  . insulin aspart  0-20 Units Subcutaneous TID WC  . insulin aspart  0-5 Units Subcutaneous QHS  . insulin aspart  10 Units Subcutaneous TID WC  . insulin detemir  25 Units Subcutaneous BID  . ipratropium-albuterol  3 mL Nebulization Q4H  . living well with diabetes book   Does not apply Once  . loratadine  10 mg Oral Daily  . Melatonin  5 mg Oral Q24H  . mirtazapine  30 mg Oral QHS  . oxyCODONE  5 mg Oral Q24H  . PARoxetine  20 mg Per Tube Daily  . pneumococcal 23 valent vaccine  0.5 mL Intramuscular Tomorrow-1000  . polyethylene glycol  17 g Oral Daily  . Ensure Max Protein  11 oz Oral BID  . QUEtiapine  100 mg Per Tube QHS  . senna-docusate  2 tablet Oral BID  . simvastatin  40 mg Oral QPM  . sodium chloride flush  3 mL Intravenous Q12H  . sodium chloride irrigation  50 mL Per Tube Q6H   Continuous Infusions: . sodium chloride    . sodium chloride Stopped (06/28/19 0532)  . sodium chloride Stopped (06/30/19 1048)  . cefTAZidime (FORTAZ)  IV 2 g (07/04/19 0545)     LOS: 10 days    Time spent: 35 minutes    Ezekiel Slocumb, DO [facility]   If 7PM-7AM, please contact night-coverage [email or web address] 07/04/2019, 4:34 PM

## 2019-07-04 NOTE — Evaluation (Signed)
Physical Therapy Evaluation Patient Details Name: Michelle [name] MRN: [id number] DOB: 1960/09/15 Today's Date: 07/04/2019   History of Present Illness  59 y.o. female with medical history significant of COPD, CHF, hyperlipidemia, coronary artery disease, hypertension, hypercholesterolemia, status post tracheostomy, came with a chief complaint of mild shortness of breath.  She is on home ventilation via trach (3-4L), does not do more than transfers at baseline.  Clinical Impression  Pt showed great effort with PT, but still hesitant to do a lot of mobility and showed some general anxiety with this.  She was able to participate well with bed exercises despite   Pt reports that she does not do more than transfers from bed to [facility] and back with daughter assisting, she appeared to be weaker than her baseline and likely would have struggled with a transfer if she was able to tolerate being up for longer (dizzy for the 2-3 minutes we were sitting at EOB).      Follow Up Recommendations SNF(per progress, limited at baseline but seems weaker now)    Equipment Recommendations  (may need Harrel Lemon if she does go back home)    Recommendations for Other Services       Precautions / Restrictions Precautions Precautions: Fall Restrictions Weight Bearing Restrictions: No      Mobility  Bed Mobility Overal bed mobility: Needs Assistance Bed Mobility: Supine to Sit;Sit to Supine     Supine to sit: Min assist;Mod assist Sit to supine: Mod assist   General bed mobility comments: Pt reports that daughter assists her with bed mobility, felt weaker than normal but able to assist and maintain sitting balance at EOB.  She initially reported some dizziness, was able to tolerate 2-3 minutes of sitting but ultimately asked to lay back down as she was feeling poorly.    Transfers                 General transfer comment: deferred, pt feeling dizzy sitting at EOB, showed very weak LEs - likely would have  struggled to keep them from buckling with a standing attempt this date  Ambulation/Gait                Stairs            Wheelchair Mobility    Modified Rankin (Stroke Patients Only)       Balance Overall balance assessment: Needs assistance Sitting-balance support: Bilateral upper extremity supported Sitting balance-Leahy Scale: Fair Sitting balance - Comments: Pt able to maintain sitting EOB w/o direct assist but w/o a lot of confidence   Standing balance support: (deferred standing)                                 Pertinent Vitals/Pain Pain Assessment: No/denies pain    Home Living Family/patient expects to be discharged to:: Private residence Living Arrangements: Children Available Help at Discharge: Family;Available 24 hours/day   Home Access: Ramped entrance       Home Equipment: Bedside commode(reports she has some sort of chair (transport vs w/c?))      Prior Function Level of Independence: Needs assistance   Gait / Transfers Assistance Needed: Pt does not walk, transfers daily from bed to [facility] with daughter assist  ADL's / Homemaking Assistance Needed: Pt reqiuires assist for most tasks, daughter is caregiver        Hand Dominance        Extremity/Trunk Assessment  Upper Extremity Assessment Upper Extremity Assessment: Generalized weakness(R grossly 3/5, L 3-/5 in limited range (lacks elev >90 b/l))    Lower Extremity Assessment Lower Extremity Assessment: Generalized weakness(grossly 3+/5 R, L 3/5, lacks 5-10* from neutral ankle DF b/l)       Communication   Communication: Tracheostomy(able to communicate somewhat without P-M valve)  Cognition Arousal/Alertness: Awake/alert Behavior During Therapy: Anxious;WFL for tasks assessed/performed Overall Cognitive Status: Within Functional Limits for tasks assessed                                        General Comments General comments (skin integrity,  edema, etc.): Pt's O2 generally stayed in the mid 90s on trach vent, HR generally stayed 100-110    Exercises General Exercises - Lower Extremity Ankle Circles/Pumps: AAROM;10 reps;Both Short Arc Quad: Strengthening;10 reps;Both Heel Slides: AROM;Strengthening;10 reps;Both(lightly resisted leg extensions b/l) Hip ABduction/ADduction: AROM;10 reps;Strengthening;Both Straight Leg Raises: AROM;5 reps;Both   Assessment/Plan    PT Assessment Patient needs continued PT services  PT Problem List Decreased strength;Decreased range of motion;Decreased activity tolerance;Decreased balance;Decreased mobility;Decreased coordination;Decreased knowledge of use of DME;Decreased safety awareness;Cardiopulmonary status limiting activity       PT Treatment Interventions DME instruction;Gait training;Stair training;Functional mobility training;Therapeutic exercise;Therapeutic activities;Balance training;Cognitive remediation;Patient/family education    PT Goals (Current goals can be found in the Care Plan section)  Acute Rehab PT Goals Patient Stated Goal: get strong enough to go back home with daughter's help PT Goal Formulation: With patient Time For Goal Achievement: 07/18/19 Potential to Achieve Goals: Fair    Frequency Min 2X/week   Barriers to discharge        Co-evaluation               AM-PAC PT "6 Clicks" Mobility  Outcome Measure Help needed turning from your back to your side while in a flat bed without using bedrails?: A Lot Help needed moving from lying on your back to sitting on the side of a flat bed without using bedrails?: A Lot Help needed moving to and from a bed to a chair (including a wheelchair)?: Total Help needed standing up from a chair using your arms (e.g., wheelchair or bedside chair)?: Total Help needed to walk in hospital room?: Total Help needed climbing 3-5 steps with a railing? : Total 6 Click Score: 8    End of Session Equipment Utilized During  Treatment: Oxygen(trach vent) Activity Tolerance: Patient limited by fatigue;Patient tolerated treatment well Patient left: in bed;with call bell/phone within reach Nurse Communication: Mobility status(vitals) PT Visit Diagnosis: Muscle weakness (generalized) (M62.81);Difficulty in walking, not elsewhere classified (R26.2)    Time: [id number] PT Time Calculation (min) (ACUTE ONLY): 33 min   Charges:   PT Evaluation $PT Eval Low Complexity: 1 Low PT Treatments $Therapeutic Exercise: 8-22 mins        Kreg Shropshire, DPT 07/04/2019, 10:49 AM

## 2019-07-04 NOTE — Progress Notes (Signed)
OT Cancellation Note  Patient Details Name: Michelle [name] MRN: [id number] DOB: 01/22/1961   Cancelled Treatment:    Reason Eval/Treat Not Completed: Fatigue/lethargy limiting ability to participate. Consult received, chart reviewed. Spoke with RN who cleared therapist to attempt to see pt. Pt very fatigued this date. Will re-attempt at later date/time as pt is able to tolerate.   Jeni Salles, MPH, MS, OTR/L ascom [phone] 07/04/19, 4:01 PM

## 2019-07-05 LAB — GLUCOSE, CAPILLARY
Glucose-Capillary: 116 mg/dL — ABNORMAL HIGH (ref 70–99)
Glucose-Capillary: 107 mg/dL — ABNORMAL HIGH (ref 70–99)
Glucose-Capillary: 155 mg/dL — ABNORMAL HIGH (ref 70–99)

## 2019-07-05 MED ORDER — BLOOD GLUCOSE MONITOR KIT: PACK | 0 refills | Status: DC

## 2019-07-05 MED ORDER — MIRTAZAPINE 30 MG PO TBDP: 30.0000 mg | ORAL_TABLET | Freq: Every day | ORAL | 0 refills | Status: DC

## 2019-07-05 MED ORDER — SIMETHICONE 80 MG PO CHEW: 80.0000 mg | CHEWABLE_TABLET | Freq: Four times a day (QID) | ORAL | 0 refills | Status: DC | PRN

## 2019-07-05 MED ORDER — INSULIN ASPART 100 UNIT/ML ~~LOC~~ SOLN: 0.0000 [IU] | Freq: Three times a day (TID) | SUBCUTANEOUS | 0 refills | Status: DC

## 2019-07-05 MED ORDER — CLONIDINE 0.1 MG/24HR TD PTWK: 0.1000 mg | MEDICATED_PATCH | TRANSDERMAL | 0 refills | Status: AC

## 2019-07-05 MED ORDER — INSULIN ASPART 100 UNIT/ML ~~LOC~~ SOLN: 10.0000 [IU] | Freq: Three times a day (TID) | SUBCUTANEOUS | 0 refills | Status: DC

## 2019-07-05 MED ORDER — INSULIN DETEMIR 100 UNIT/ML ~~LOC~~ SOLN: 25.0000 [IU] | Freq: Two times a day (BID) | SUBCUTANEOUS | 0 refills | Status: DC

## 2019-07-05 MED ORDER — PEN NEEDLES 32G X 6 MM MISC: 1.0000 | Freq: Three times a day (TID) | 0 refills | Status: DC

## 2019-07-05 MED ORDER — SODIUM CHLORIDE 0.9 % IR SOLN: 50.0000 mL | Freq: Four times a day (QID) | 0 refills | Status: DC

## 2019-07-05 NOTE — Progress Notes (Signed)
Pt discharged home via EMS. Pt provided with discharge paperwork and information. Pt's belongings and home vent traveled with pt and EMS.

## 2019-07-05 NOTE — TOC Transition Note (Signed)
Transition of Care [name]) - CM/SW Discharge Note   Patient Details  Name: Michelle [name] MRN: [id number] Date of Birth: Jul 20, 1960  Transition of Care [facility]) CM/SW Contact:  Shelbie Hutching, RN Phone Number: 07/05/2019, 11:21 AM   Clinical Narrative:    Bedside RN has called and set up [facility] transport to take patient home.  Patient's daughter [name] will be at the home all day but requests for call when EMS arrives at the hospital.  Daughter is also aware that prescriptions will be sent to [facility] in [place] and they will need to pick them up today.    Final next level of care: [place] Barriers to Discharge: Barriers Resolved   Patient Goals and CMS Choice Patient states their goals for this hospitalization and ongoing recovery are:: Family and patient would like for her to get back home CMS Medicare.gov Compare Post Acute Care list provided to:: Patient Represenative (must comment)(daughters) Choice offered to / list presented to : Adult Children  Discharge Placement                       Discharge Plan and Services   Discharge Planning Services: CM Consult Post Acute Care Choice: Home Health, Resumption of Svcs/PTA Provider([facility] for nursing)          DME Arranged: Other see comment(hoyer lift) DME Agency: [facility] Date DME Agency Contacted: 07/05/19 Time DME Agency Contacted: [id number] Representative spoke with at DME Agency: [place] Arranged: RN [place] Agency: [place] Date [place]: 07/05/19 Time [place]: [place] Representative spoke with at [place]: [place] intake office- orders faxed [phone]  Social Determinants of Health (SDOH) Interventions     Readmission Risk Interventions Readmission Risk Prevention Plan 09/19/2018  Transportation Screening Complete  PCP or Specialist Appt within 5-7 Days Complete  Home Care Screening Complete  Medication Review (RN CM) Complete  Some recent  data might be hidden

## 2019-07-05 NOTE — TOC Initial Note (Addendum)
Transition of Care [facility]) - Initial/Assessment Note    Patient Details  Name: Michelle [name] MRN: [id number] Date of Birth: 07/02/1960  Transition of Care [facility]) CM/SW Contact:    Shelbie Hutching, RN Phone Number: 07/05/2019, 10:53 AM  Clinical Narrative:                 Patient admitted to the hospital with acute respiratory failure and hyperglycemia.  Patient is from home with her daughter Michelle [name]. 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  Patient has a hospital bed and nursing care at home.  Home Health nursing is through [facility].  Home Health orders faxed to [phone].  Patient will discharge home today with EMS transport.  Hoyer lift ordered for home use and will be delivered some time this week to the patient's home by [place].  Patient will be going home on insulin and daughter Michelle [name] reports that she and her sister are comfortable checking sugars and giving insulin and home health RN will be coming out also to assist with any needs and continue education.  Patient is also followed by [facility].    Expected Discharge Plan: [place] Barriers to Discharge: Barriers Resolved   Patient Goals and CMS Choice Patient states their goals for this hospitalization and ongoing recovery are:: Family and patient would like for her to get back home CMS Medicare.gov Compare Post Acute Care list provided to:: Patient Represenative (must comment)(daughters) Choice offered to / list presented to : Adult Children  Expected Discharge Plan and Services Expected Discharge Plan: [place]   Discharge Planning Services: CM Consult Post Acute Care Choice: Home Health, Resumption of Svcs/PTA Provider([facility] for nursing) Living arrangements for the past 2 months: Single Family Home                 DME Arranged: Other see comment(hoyer lift) DME Agency: [facility] Date DME Agency Contacted: 07/05/19 Time DME  Agency Contacted: [id number] Representative spoke with at DME Agency: [place] Arranged: RN [place] Agency: [place] Date [place]: 07/05/19 Time [place]: [place] Representative spoke with at [place]: [place] intake office- orders faxed [phone]  Prior Living Arrangements/Services Living arrangements for the past 2 months: [place] Lives with:: Adult Children Patient language and need for interpreter reviewed:: Yes Do you feel safe going back to the place where you live?: Yes      Need for Family Participation in Patient Care: Yes (Comment)(chronic trach, on home vent) Care giver support system in place?: Yes (comment)(daughters) Current home services: DME, Home RN(hospital bed, trilogy, wheelchair, walker) Criminal Activity/Legal Involvement Pertinent to Current Situation/Hospitalization: No - Comment as needed  Activities of Daily Living Home Assistive Devices/Equipment: Vent/Trach supplies, Oxygen, Walker (specify type), Wheelchair ADL Screening (condition at time of admission) Patient's cognitive ability adequate to safely complete daily activities?: Yes Is the patient deaf or have difficulty hearing?: No Does the patient have difficulty seeing, even when wearing glasses/contacts?: No Does the patient have difficulty concentrating, remembering, or making decisions?: No Patient able to express need for assistance with ADLs?: Yes Does the patient have difficulty dressing or bathing?: Yes Independently performs ADLs?: No Communication: Independent Dressing (OT): Needs assistance Is this a change from baseline?: Pre-admission baseline Grooming: Needs assistance Is this a change from baseline?: Pre-admission baseline Feeding: Independent Bathing: Needs assistance Is this a change from baseline?: Pre-admission baseline Toileting: Needs assistance  Is this a change from baseline?: Pre-admission baseline In/Out Bed: Needs assistance Is  this a change from baseline?: Pre-admission baseline Walks in Home: Needs assistance Is this a change from baseline?: Pre-admission baseline Does the patient have difficulty walking or climbing stairs?: Yes Weakness of Legs: Both Weakness of Arms/Hands: Both  Permission Sought/Granted Permission sought to share information with : Case Manager, Other (comment), Family Supports Permission granted to share information with : Yes, Verbal Permission Granted     Permission granted to share info w AGENCY: [facility], [facility]        Emotional Assessment         Alcohol / Substance Use: Not Applicable Psych Involvement: No (comment)  Admission diagnosis:  Cough [R05] Pneumonia [J18.9] Hyperglycemia [R73.9] Patient Active Problem List   Diagnosis Date Noted  . Pseudomonas respiratory infection 07/03/2019  . Depression with anxiety 07/03/2019  . Hyperlipidemia associated with type 2 diabetes mellitus ([place]) 07/03/2019  . PEG (percutaneous endoscopic gastrostomy) status ([place]) 07/03/2019  . Chronic pain 07/03/2019  . Hypokalemia 07/03/2019  . 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 Chronic respiratory failure requiring continuous mechanical ventilation through tracheostomy ([place]) 10/20/2018  . CAD (coronary artery disease) 10/20/2018  . HTN (hypertension) 10/20/2018  . Chronic diastolic CHF (congestive heart failure) ([place]) 10/20/2018  . Pneumonia 09/17/2018  . Panic disorder 10/29/2017  . Altered mental status   . Pressure injury of skin 10/23/2017  . Acute on chronic respiratory failure with hypoxia and hypercapnia ([place]) 06/29/2017  . Acute on chronic respiratory failure with hypoxia ([place]) 02/23/2017  . Protein-calorie malnutrition, severe 05/23/2016  . Sepsis ([place]) 05/22/2016  . Malnutrition of moderate degree ([place]) 10/25/2014  . COPD exacerbation ([place]) 10/24/2014  . Tobacco abuse 10/24/2014  . Community acquired pneumonia 10/24/2014    PCP:  Frazier Richards, MD Pharmacy:   [facility], [place] [address] Phone: [phone] Fax: [phone]  [id number] [place], [place] [address] Phone: [phone] Fax: [phone]     Social Determinants of Health (SDOH) Interventions    Readmission Risk Interventions Readmission Risk Prevention Plan 09/19/2018  Transportation Screening Complete  PCP or Specialist Appt within 5-7 Days Complete  Home Care Screening Complete  Medication Review (RN CM) Complete  Some recent data might be hidden

## 2019-07-05 NOTE — Discharge Summary (Signed)
Physician Discharge Summary  Patient ID: Michelle [name] MRN: [id number] DOB/AGE: 08-12-1960 59 y.o.  Admit date: 06/24/2019 Discharge date: 07/05/2019  Admission Diagnoses: acute on chronic respiratory failure Pseudomonas pneumonia  Discharge Diagnoses:  Principal Problem:   Pseudomonas respiratory infection Active Problems:   COPD exacerbation ([place])   Chronic respiratory failure requiring continuous mechanical ventilation through tracheostomy ([place])   CAD (coronary artery disease)   HTN (hypertension)   Chronic diastolic CHF (congestive heart failure) ([place])   New onset of type 2 diabetes mellitus in pediatric patient [facility])   Depression with anxiety   Hyperlipidemia associated with type 2 diabetes mellitus ([place])   PEG (percutaneous endoscopic gastrostomy) status ([place])   Chronic pain   Hypokalemia   Discharged Condition: Stable  Hospital Course:  Brief Narrative:  Michelle [name] a 59 y.o.femalewith medical history significant ofCOPD, CHF, hyperlipidemia, coronary artery disease, hypertension, hypercholesterolemia, status post tracheostomy and vent-dependent, came with a chief complaint of shortness of breath with yellow productive sputum. She is on 4 L of oxygen all the time.  In the ED, patient was in mild respiratory distress.  Chest xray showed lower lobe infiltrate.  Labs notable for glucose 563, normal anion gap, no leukocytosis.  Tracheal aspirate culture obtained and growing Pseudomonas, being treated with ceftazidime.    Subjective 1/28: Patient reports feeling well today.  States she has not had any more panic attacks since medication changes a few days ago. Denies fever/chills, CP, SOB or other acute complaints.  No acute events reported overnight.  06/26/19-patient had another episode of resp distress overnight 06/27/19- patient clinically improved, serial lavages via tracheal catheter to aspirate mucus plugging done by RT today, patient feels better.  06/28/19- off  precedex infusion, patient with pseudomonas colonization however repeated bouts of respiratory distress and readmissions, will start TOBI bid today, plan to d/c home soon if continues to stay off precedex 1/25 during the day-did nOT tolerate home vent Very agitated, precedex infusion started 1/27 on home vent, wants to go, I explained she needs ABX for pneumonia 1/31 d/c home  PATIENT TREATED FOR PSEUDOMONAL PNEUMONIA Continue ceftazidime 2g IV q8 hours until 1/30.  Serum creatinine appears to be improving and stabilizing.     Consults: PCCM  Significant Diagnostic Studies:  CXR  Treatments:  SEVERE COPD EXACERBATION -steroids as prescribed -continue NEB THERAPY as prescribed -chronic pseudomonas + respiratory cultures    END STAGE HYPOXIC AND HYPERCAPNIC RESP FAILURE VENT DEPENDANT On home vent   INFECTIOUS DISEASE Completed ABX  ANXIETY MUCH IMPOVED PAIN MEDS AS NEEDED   GI GI PROPHYLAXIS as indicated  NUTRITIONAL STATUS DIET-->patient eats by mouth as requested by patient Constipation protocol as indicated  ELECTROLYTES -follow labs as needed -replace as needed -pharmacy consultation and following    DVT/GI PRX ordered TRANSFUSIONS AS NEEDED MONITOR FSBS ASSESS the need for LABS as needed Discharge Exam: Blood pressure (!) 153/85, pulse 95, temperature 98.5 F (36.9 C), temperature source Oral, resp. rate (!) 22, height 5' (1.524 m), weight 89.5 kg, last menstrual period 03/06/2005, SpO2 99 %.   Disposition: [place] CARE   Allergies as of 07/05/2019      Reactions   Amoxicillin    Patient has tolerated cephalosporins in the past      Medication List    STOP taking these medications   AMBULATORY NON FORMULARY MEDICATION   ciprofloxacin 750 MG tablet Commonly known as: CIPRO   hydrochlorothiazide 12.5 MG capsule Commonly known as: MICROZIDE  mirtazapine 30 MG tablet Commonly known as: REMERON Replaced by:  mirtazapine 30 MG disintegrating tablet   Perforomist 20 MCG/2ML nebulizer solution Generic drug: formoterol     TAKE these medications   aspirin 81 MG chewable tablet Chew 81 mg by mouth daily.   blood glucose meter kit and supplies Kit Dispense based on patient and insurance preference. Use up to four times daily as directed. (FOR ICD-9 250.00, 250.01).   budesonide 0.25 MG/2ML nebulizer solution Commonly known as: PULMICORT Take 2 mLs (0.25 mg total) by nebulization 2 (two) times daily.   cetirizine 10 MG tablet Commonly known as: ZYRTEC Place 10 mg into feeding tube daily.   clonazePAM 0.5 MG tablet Commonly known as: KLONOPIN Take 3 tablets (1.5 mg total) by mouth 3 (three) times daily. What changed:   medication strength  how much to take  when to take this   cloNIDine 0.1 mg/24hr patch Commonly known as: CATAPRES - Dosed in mg/24 hr Place 1 patch (0.1 mg total) onto the skin once a week.   Ensure Max Protein Liqd Take 330 mLs (11 oz total) by mouth 2 (two) times daily.   famotidine 20 MG tablet Commonly known as: PEPCID Place 1 tablet (20 mg total) into feeding tube daily.   fentaNYL 25 MCG/HR Commonly known as: DURAGESIC Place 1 patch (25 mcg total) onto the skin every 3 (three) days.   fluticasone 50 MCG/ACT nasal spray Commonly known as: FLONASE Place 1 spray into both nostrils daily.   guaiFENesin-dextromethorphan 100-10 MG/5ML syrup Commonly known as: ROBITUSSIN DM Take 10 mLs by mouth every 6 (six) hours as needed for cough.   insulin aspart 100 UNIT/ML injection Commonly known as: novoLOG Inject 0-20 Units into the skin 3 (three) times daily with meals.   insulin aspart 100 UNIT/ML injection Commonly known as: novoLOG Inject 10 Units into the skin 3 (three) times daily with meals.   insulin detemir 100 UNIT/ML injection Commonly known as: LEVEMIR Inject 0.25 mLs (25 Units total) into the skin 2 (two) times daily.   ipratropium-albuterol  0.5-2.5 (3) MG/3ML Soln Commonly known as: DUONEB Take 3 mLs by nebulization every 6 (six) hours. What changed:   when to take this  reasons to take this   Melatonin 5 MG Tabs Take 1 tablet (5 mg total) by mouth daily.   mirtazapine 30 MG disintegrating tablet Commonly known as: REMERON SOL-TAB Take 1 tablet (30 mg total) by mouth at bedtime. Replaces: mirtazapine 30 MG tablet   oxyCODONE-acetaminophen 5-325 MG tablet Commonly known as: PERCOCET/ROXICET Take 1 tablet by mouth every 8 (eight) hours as needed.   PARoxetine 20 MG tablet Commonly known as: PAXIL Place 1 tablet (20 mg total) into feeding tube at bedtime. What changed: when to take this   Pen Needles 32G X 6 MM Misc 1 each by Does not apply route 4 (four) times daily - after meals and at bedtime.   polyethylene glycol 17 g packet Commonly known as: MIRALAX / GLYCOLAX Place 17 g into feeding tube daily.   QUEtiapine 100 MG tablet Commonly known as: SEROQUEL Place 1 tablet (100 mg total) into feeding tube at bedtime. What changed:   medication strength  how much to take   senna 8.6 MG Tabs tablet Commonly known as: SENOKOT Place 1-2 tablets into feeding tube at bedtime.   simethicone 80 MG chewable tablet Commonly known as: MYLICON Chew 1 tablet (80 mg total) by mouth every 6 (six) hours as needed for flatulence.  simvastatin 20 MG tablet Commonly known as: ZOCOR Take 40 mg by mouth every evening.   sodium chloride irrigation 0.9 % irrigation Irrigate with 50 mLs as directed every 6 (six) hours.     ASK your doctor about these medications   living well with diabetes book Misc 1 each by Does not apply route once for 1 dose. Ask about: Should I take this medication?            Durable Medical Equipment  (From admission, onward)         Start     Ordered   07/05/19 1041  For home use only DME Other see comment  Once    Comments: Harrel Lemon lift  Question:  Length of Need  Answer:  Lifetime    07/05/19 1040           Signed: Maretta Bees  07/05/2019, 11:11 AM

## 2019-08-04 ENCOUNTER — Other Ambulatory Visit: Payer: Medicaid Other | Admitting: Primary Care

## 2019-08-04 ENCOUNTER — Other Ambulatory Visit: Payer: Self-pay

## 2019-08-04 DIAGNOSIS — Z515 Encounter for palliative care: Secondary | ICD-10-CM

## 2019-08-04 NOTE — Progress Notes (Signed)
[facility] Consult Note Telephone: [phone]  Fax: [phone]   PATIENT NAME: Michelle [name] [address] [place] [address] [phone] (home)  DOB: 13-Apr-1961 MRN: [id number]  PRIMARY CARE PROVIDER:   Frazier Richards, MD, [place] [address] [phone]  REFERRING PROVIDER:  Frazier [name], [place],  [place] [address] [phone]  RESPONSIBLE PARTY:   Extended Emergency Contact Information Primary Emergency Contact: Michelle [name], Michelle [name] Phone: [phone] Mobile Phone: [phone] Relation: Daughter Secondary Emergency Contact: Michelle [name] Address: [address] [place], [place] [address] Michelle [name] Phone: [phone] Mobile Phone: [phone] Relation: Daughter   ASSESSMENT AND RECOMMENDATIONS:   1. Advance Care Planning/Goals of Care: Goals include to maximize quality of life and symptom management. MOST form in home, DNR full scope of care, antibiotic use, IV use and she already has a feeding tube. Recent hospital stay for pseudomonas pneumonia. We discussed MOST choices, they endorse no changes. Daughter to f/u with POA notarizing.  2. Symptom Management:   Glucose: A1c= 11 on latest hospital stay. We discussed her pepsi intake and other diet choices. They are making some changes in diet to include whole wheat bread.  Recent change to sliding scale at mealtimes. Has endocrine provider, to follow up with them this Friday.   Dyspnea: Her lung sounds were diminished with expiratory wheezes, recent endorsement of sinus drainage. She does her nebulizers on a schedule with recent increase in budesonide dose. She is on a NIV constantly, with oxygen. Scheduled to be changed out for service. Has suction which daughter performs. She states she does not experience feelings of respiratory distress at her baseline.   Pain: Ran out of patch, now  on fentanyl 25 mcg but has gone without for several days. Daughter needs to pick up prescription. Pt. endorses pain at 7/10. She is taking prn percocet for pain management. Recommended  ATC acetaminophen for basal pain control.  Feedings: Checked G tube, needs cleaning but is secure. Patent.  . 3. Family /Caregiver/Community Supports: [place], multigenerational home.  4. Cognitive / Functional decline: Alert, oriented. Difficult to understand due to trach. Has gotten oob some to chair. Needs assist with most adls.  5. Follow up Palliative Care Visit: Palliative care will continue to follow for goals of care clarification and symptom management. Return 4-6 weeks or prn.  I spent 60 minutes providing this consultation,  from 1030 to 1130. More than 50% of the time in this consultation was spent coordinating communication.   HISTORY OF PRESENT ILLNESS:  Michelle [name] is a 59 y.o. year old female with multiple medical problems including COPD, NIV dependency, chronic pain, hepatitis C, CHF, CAD.Palliative Care was asked to follow this patient by consultation request of Michelle [name], Michelle Perch, MD to help address advance care planning and goals of care. This is a follow up visit.  CODE STATUS: DNR full scope of care, antibiotic use, IV use and she already has a feeding tube.  PPS: 30% HOSPICE ELIGIBILITY/DIAGNOSIS: TBD  PAST MEDICAL HISTORY:  Past Medical History:  Diagnosis Date  . Allergy   . Anxiety   . Asthma   . CHF (congestive heart failure) ([place])   . Cocaine abuse ([place])   . COPD (chronic obstructive pulmonary disease) ([place])   . Coronary artery disease   . Emphysema/COPD ([place])   . Hepatitis  C   . High cholesterol   . Hypertension   . Pneumothorax   . Tobacco abuse     SOCIAL HX:  Social History   Tobacco Use  . Smoking status: Former Smoker    Packs/day: 0.10    Years: 41.00    Pack years: 4.10    Types: Cigarettes    Quit date: 06/09/2017    Years since  quitting: 2.1  . Smokeless tobacco: Never Used  Substance Use Topics  . Alcohol use: No    ALLERGIES:  Allergies  Allergen Reactions  . Amoxicillin     Patient has tolerated cephalosporins in the past     PERTINENT MEDICATIONS:  Outpatient Encounter Medications as of 08/04/2019  Medication Sig  . aspirin 81 MG chewable tablet Chew 81 mg by mouth daily.   . blood glucose meter kit and supplies KIT Dispense based on patient and insurance preference. Use up to four times daily as directed. (FOR ICD-9 250.00, 250.01).  . budesonide (PULMICORT) 0.25 MG/2ML nebulizer solution Take 2 mLs (0.25 mg total) by nebulization 2 (two) times daily. (Patient taking differently: Take 0.5 mg by nebulization 2 (two) times daily. )  . cetirizine (ZYRTEC) 10 MG tablet Place 10 mg into feeding tube daily.   . clonazePAM (KLONOPIN) 0.5 MG tablet Take 3 tablets (1.5 mg total) by mouth 3 (three) times daily. (Patient taking differently: Take 0.5 mg by mouth 3 (three) times daily. )  . cloNIDine (CATAPRES - DOSED IN MG/24 HR) 0.1 mg/24hr patch Place 1 patch (0.1 mg total) onto the skin once a week.  . Ensure Max Protein (ENSURE MAX PROTEIN) LIQD Take 330 mLs (11 oz total) by mouth 2 (two) times daily.  . famotidine (PEPCID) 20 MG tablet Place 1 tablet (20 mg total) into feeding tube daily.  . fentaNYL (DURAGESIC) 25 MCG/HR Place 1 patch onto the skin every 3 (three) days.  . fluticasone (FLONASE) 50 MCG/ACT nasal spray Place 1 spray into both nostrils daily.   Marland Kitchen guaiFENesin-dextromethorphan (ROBITUSSIN DM) 100-10 MG/5ML syrup Take 10 mLs by mouth every 6 (six) hours as needed for cough.  . insulin aspart (NOVOLOG) 100 UNIT/ML injection Inject 0-20 Units into the skin 3 (three) times daily with meals.  . insulin detemir (LEVEMIR) 100 UNIT/ML injection Inject 0.25 mLs (25 Units total) into the skin 2 (two) times daily.  . insulin lispro (HUMALOG) 100 UNIT/ML injection Inject 3 Units into the skin 3 (three) times daily  with meals. Sliding scale 61-150 = 0 units 151-200= 3 units 201-250= 5 units 251-300 = 10 units  301-350 = 12 units 351-400= 14 units Call MD over 401 units  . Insulin Pen Needle (PEN NEEDLES) 32G X 6 MM MISC 1 each by Does not apply route 4 (four) times daily - after meals and at bedtime.  Marland Kitchen ipratropium-albuterol (DUONEB) 0.5-2.5 (3) MG/3ML SOLN Take 3 mLs by nebulization every 6 (six) hours. (Patient taking differently: Take 3 mLs by nebulization every 6 (six) hours as needed (for shortness of breath). )  . mirtazapine (REMERON SOL-TAB) 30 MG disintegrating tablet Take 1 tablet (30 mg total) by mouth at bedtime.  Marland Kitchen oxyCODONE-acetaminophen (PERCOCET/ROXICET) 5-325 MG tablet Take 1 tablet by mouth every 8 (eight) hours as needed.  Marland Kitchen PARoxetine (PAXIL) 20 MG tablet Place 1 tablet (20 mg total) into feeding tube at bedtime. (Patient taking differently: Place 20 mg into feeding tube daily. )  . polyethylene glycol (MIRALAX / GLYCOLAX) 17 g packet Place 17 g into  feeding tube daily.  . QUEtiapine (SEROQUEL) 100 MG tablet Place 1 tablet (100 mg total) into feeding tube at bedtime.  . senna (SENOKOT) 8.6 MG TABS tablet Place 1-2 tablets into feeding tube at bedtime.   . simethicone (MYLICON) 80 MG chewable tablet Chew 1 tablet (80 mg total) by mouth every 6 (six) hours as needed for flatulence.  . sodium chloride irrigation 0.9 % irrigation Irrigate with 50 mLs as directed every 6 (six) hours.  . [DISCONTINUED] insulin aspart (NOVOLOG) 100 UNIT/ML injection Inject 10 Units into the skin 3 (three) times daily with meals.  . [DISCONTINUED] Melatonin 5 MG TABS Take 1 tablet (5 mg total) by mouth daily.  . [DISCONTINUED] simvastatin (ZOCOR) 20 MG tablet Take 40 mg by mouth every evening.   . [DISCONTINUED] fentaNYL (DURAGESIC - DOSED MCG/HR) 25 MCG/HR patch Place 1 patch (25 mcg total) onto the skin every 3 (three) days.   No facility-administered encounter medications on file as of 08/04/2019.      PHYSICAL EXAM / ROS:   Current and past weights:197 lb per recent hospital record General: NAD, frail appearing, obese Cardiovascular: no chest pain reported, no edema, S1S2 RRR Pulmonary: + cough, no increased SOB, lungs with exp wheezes, NIV with Oxygen via trach. Abdomen: appetite good,  Denies  constipation, incontinent of bowel, Range of  bg=361 highest, 142 lowest. GU: denies dysuria, incontinent of urine  MSK:  no joint deformities, non ambulatory, oob to chair with lift Skin: no rashes or wounds reported Neurological: Weakness, endorses 7/10 pain  Jason Coop, NP, [facility]  COVID-19 PATIENT SCREENING TOOL  Person answering questions: _______[name]____________ _____   1.  Is the patient or any family member in the home showing any signs or symptoms regarding respiratory infection?               Person with Symptom- ___________________________  a. Fever                                                                          Yes___ No___          ___________________  b. Shortness of breath                                                    Yes___ No___          ___________________ c. Cough/congestion                                       Yes___  No___         ___________________ d. Body aches/pains                                                         Yes___ No___        ____________________ e. Gastrointestinal  symptoms (diarrhea, nausea)           Yes___ No___        ____________________  2. Within the past 14 days, has anyone living in the home had any contact with someone with or under investigation for COVID-19?    Yes___ No__x   Person __________________

## 2019-08-06 ENCOUNTER — Other Ambulatory Visit: Payer: Self-pay

## 2019-08-06 ENCOUNTER — Emergency Department: Payer: Medicaid Other

## 2019-08-06 ENCOUNTER — Inpatient Hospital Stay
Admission: EM | Admit: 2019-08-06 | Discharge: 2019-08-12 | DRG: 870 | Disposition: A | Discharge status: Home Health | Payer: Medicaid Other | Attending: Internal Medicine | Admitting: Internal Medicine

## 2019-08-06 DIAGNOSIS — F419 Anxiety disorder, unspecified: Secondary | ICD-10-CM | POA: Diagnosis present

## 2019-08-06 DIAGNOSIS — Z7951 Long term (current) use of inhaled steroids: Secondary | ICD-10-CM

## 2019-08-06 DIAGNOSIS — A419 Sepsis, unspecified organism: Principal | ICD-10-CM | POA: Diagnosis present

## 2019-08-06 DIAGNOSIS — Z79899 Other long term (current) drug therapy: Secondary | ICD-10-CM

## 2019-08-06 DIAGNOSIS — F1411 Cocaine abuse, in remission: Secondary | ICD-10-CM | POA: Diagnosis present

## 2019-08-06 DIAGNOSIS — Y848 Other medical procedures as the cause of abnormal reaction of the patient, or of later complication, without mention of misadventure at the time of the procedure: Secondary | ICD-10-CM | POA: Diagnosis present

## 2019-08-06 DIAGNOSIS — J690 Pneumonitis due to inhalation of food and vomit: Secondary | ICD-10-CM | POA: Diagnosis present

## 2019-08-06 DIAGNOSIS — E876 Hypokalemia: Secondary | ICD-10-CM | POA: Diagnosis not present

## 2019-08-06 DIAGNOSIS — E785 Hyperlipidemia, unspecified: Secondary | ICD-10-CM | POA: Diagnosis present

## 2019-08-06 DIAGNOSIS — R6521 Severe sepsis with septic shock: Secondary | ICD-10-CM | POA: Diagnosis present

## 2019-08-06 DIAGNOSIS — Z794 Long term (current) use of insulin: Secondary | ICD-10-CM

## 2019-08-06 DIAGNOSIS — Z8249 Family history of ischemic heart disease and other diseases of the circulatory system: Secondary | ICD-10-CM

## 2019-08-06 DIAGNOSIS — Z823 Family history of stroke: Secondary | ICD-10-CM

## 2019-08-06 DIAGNOSIS — E7800 Pure hypercholesterolemia, unspecified: Secondary | ICD-10-CM | POA: Diagnosis present

## 2019-08-06 DIAGNOSIS — Z9911 Dependence on respirator [ventilator] status: Secondary | ICD-10-CM

## 2019-08-06 DIAGNOSIS — I5032 Chronic diastolic (congestive) heart failure: Secondary | ICD-10-CM | POA: Diagnosis present

## 2019-08-06 DIAGNOSIS — Z87891 Personal history of nicotine dependence: Secondary | ICD-10-CM

## 2019-08-06 DIAGNOSIS — R570 Cardiogenic shock: Secondary | ICD-10-CM | POA: Diagnosis not present

## 2019-08-06 DIAGNOSIS — J9622 Acute and chronic respiratory failure with hypercapnia: Secondary | ICD-10-CM | POA: Diagnosis present

## 2019-08-06 DIAGNOSIS — Z881 Allergy status to other antibiotic agents status: Secondary | ICD-10-CM

## 2019-08-06 DIAGNOSIS — J441 Chronic obstructive pulmonary disease with (acute) exacerbation: Secondary | ICD-10-CM | POA: Diagnosis present

## 2019-08-06 DIAGNOSIS — I110 Hypertensive heart disease with heart failure: Secondary | ICD-10-CM | POA: Diagnosis present

## 2019-08-06 DIAGNOSIS — Z801 Family history of malignant neoplasm of trachea, bronchus and lung: Secondary | ICD-10-CM

## 2019-08-06 DIAGNOSIS — F418 Other specified anxiety disorders: Secondary | ICD-10-CM | POA: Diagnosis present

## 2019-08-06 DIAGNOSIS — Z825 Family history of asthma and other chronic lower respiratory diseases: Secondary | ICD-10-CM

## 2019-08-06 DIAGNOSIS — E119 Type 2 diabetes mellitus without complications: Secondary | ICD-10-CM | POA: Diagnosis present

## 2019-08-06 DIAGNOSIS — J9621 Acute and chronic respiratory failure with hypoxia: Secondary | ICD-10-CM | POA: Diagnosis present

## 2019-08-06 DIAGNOSIS — Z7982 Long term (current) use of aspirin: Secondary | ICD-10-CM

## 2019-08-06 DIAGNOSIS — Z66 Do not resuscitate: Secondary | ICD-10-CM | POA: Diagnosis present

## 2019-08-06 DIAGNOSIS — I2510 Atherosclerotic heart disease of native coronary artery without angina pectoris: Secondary | ICD-10-CM | POA: Diagnosis present

## 2019-08-06 DIAGNOSIS — Z8701 Personal history of pneumonia (recurrent): Secondary | ICD-10-CM

## 2019-08-06 DIAGNOSIS — J95851 Ventilator associated pneumonia: Secondary | ICD-10-CM | POA: Diagnosis present

## 2019-08-06 DIAGNOSIS — B1920 Unspecified viral hepatitis C without hepatic coma: Secondary | ICD-10-CM | POA: Diagnosis present

## 2019-08-06 DIAGNOSIS — Z20822 Contact with and (suspected) exposure to covid-19: Secondary | ICD-10-CM | POA: Diagnosis present

## 2019-08-06 DIAGNOSIS — Z930 Tracheostomy status: Secondary | ICD-10-CM

## 2019-08-06 DIAGNOSIS — R140 Abdominal distension (gaseous): Secondary | ICD-10-CM | POA: Diagnosis not present

## 2019-08-06 LAB — COMPREHENSIVE METABOLIC PANEL
ALT: 21 U/L (ref 0–44)
AST: 25 U/L (ref 15–41)
Albumin: 4.7 g/dL (ref 3.5–5.0)
Alkaline Phosphatase: 134 U/L — ABNORMAL HIGH (ref 38–126)
Anion gap: 10 (ref 5–15)
BUN: 12 mg/dL (ref 6–20)
CO2: 22 mmol/L (ref 22–32)
Calcium: 9.1 mg/dL (ref 8.9–10.3)
Chloride: 109 mmol/L (ref 98–111)
Creatinine, Ser: 0.69 mg/dL (ref 0.44–1.00)
GFR calc Af Amer: >60 mL/min (ref >60)
GFR calc non Af Amer: >60 mL/min (ref >60)
Glucose, Bld: 165 mg/dL — ABNORMAL HIGH (ref 70–99)
Potassium: 3.4 mmol/L — ABNORMAL LOW (ref 3.5–5.1)
Sodium: 141 mmol/L (ref 135–145)
Total Bilirubin: 0.7 mg/dL (ref 0.3–1.2)
Total Protein: 9.2 g/dL — ABNORMAL HIGH (ref 6.5–8.1)

## 2019-08-06 LAB — CBC WITH DIFFERENTIAL/PLATELET
Abs Immature Granulocytes: 0.15 10*3/uL — ABNORMAL HIGH (ref 0.00–0.07)
Basophils Absolute: 0.1 10*3/uL (ref 0.0–0.1)
Basophils Relative: 0 %
Eosinophils Absolute: 0.2 10*3/uL (ref 0.0–0.5)
Eosinophils Relative: 1 %
HCT: 46.6 % — ABNORMAL HIGH (ref 36.0–46.0)
Hemoglobin: 14.1 g/dL (ref 12.0–15.0)
Immature Granulocytes: 1 %
Lymphocytes Relative: 8 %
Lymphs Abs: 1.3 10*3/uL (ref 0.7–4.0)
MCH: 26 pg (ref 26.0–34.0)
MCHC: 30.3 g/dL (ref 30.0–36.0)
MCV: 85.8 fL (ref 80.0–100.0)
Monocytes Absolute: 1.4 10*3/uL — ABNORMAL HIGH (ref 0.1–1.0)
Monocytes Relative: 9 %
Neutro Abs: 13.2 10*3/uL — ABNORMAL HIGH (ref 1.7–7.7)
Neutrophils Relative %: 81 %
Platelets: 270 10*3/uL (ref 150–400)
RBC: 5.43 MIL/uL — ABNORMAL HIGH (ref 3.87–5.11)
RDW: 15.3 % (ref 11.5–15.5)
WBC: 16.3 10*3/uL — ABNORMAL HIGH (ref 4.0–10.5)
nRBC: 0 % (ref 0.0–0.2)

## 2019-08-06 LAB — LACTIC ACID, PLASMA: Lactic Acid, Venous: 1.4 mmol/L (ref 0.5–1.9)

## 2019-08-06 MED ORDER — METRONIDAZOLE IN NACL 5-0.79 MG/ML-% IV SOLN
500.0000 mg | Freq: Once | INTRAVENOUS | Status: AC
  Administered 2019-08-07: 500 mg via INTRAVENOUS
  Filled 2019-08-06: qty 100

## 2019-08-06 MED ORDER — LORAZEPAM 2 MG/ML IJ SOLN
1.0000 mg | Freq: Once | INTRAMUSCULAR | Status: DC
  Filled 2019-08-06: qty 1

## 2019-08-06 MED ORDER — IPRATROPIUM-ALBUTEROL 0.5-2.5 (3) MG/3ML IN SOLN
RESPIRATORY_TRACT | Status: AC
  Administered 2019-08-07: 01:00:00 3 mL
  Filled 2019-08-06: qty 3

## 2019-08-06 MED ORDER — SODIUM CHLORIDE 0.9 % IV SOLN
2.0000 g | Freq: Once | INTRAVENOUS | Status: AC
  Administered 2019-08-06: 2 g via INTRAVENOUS
  Filled 2019-08-06: qty 2

## 2019-08-06 MED ORDER — DIAZEPAM 5 MG/ML IJ SOLN
5.0000 mg | Freq: Once | INTRAMUSCULAR | Status: AC
  Administered 2019-08-06: 5 mg via INTRAVENOUS
  Filled 2019-08-06: qty 2

## 2019-08-06 MED ORDER — VANCOMYCIN HCL IN DEXTROSE 1-5 GM/200ML-% IV SOLN
1000.0000 mg | Freq: Once | INTRAVENOUS | Status: AC
  Administered 2019-08-07: 1000 mg via INTRAVENOUS
  Filled 2019-08-06: qty 200

## 2019-08-06 NOTE — Progress Notes (Signed)
CODE SEPSIS - PHARMACY COMMUNICATION  **[facility] Antibiotics should be administered within 1 hour of Sepsis diagnosis**  Time Code Sepsis Called/Page Received: 2246  Antibiotics Ordered: Aztreonam, Flagyl, Vancomycin  Time of 1st antibiotic administration: 2312, Aztreonam  Additional action taken by pharmacy: n/a  If necessary, Name of Provider/Nurse Contacted: n/a    Ena Dawley ,PharmD Clinical Pharmacist  08/06/2019  10:47 PM

## 2019-08-06 NOTE — Progress Notes (Signed)
PHARMACY -  BRIEF ANTIBIOTIC NOTE   Pharmacy has received consult(s) for Aztreonam and Vancomycin from an ED provider.  The patient's profile has been reviewed for ht/wt/allergies/indication/available labs.    One time order(s) placed for Aztreonam 2gm and Vancomycin 2gm (1gm + 1gm)  Further antibiotics/pharmacy consults should be ordered by admitting physician if indicated.                       Thank you, Hart Robinsons A 08/06/2019  10:50 PM

## 2019-08-06 NOTE — Progress Notes (Signed)
Patient arrived via EMS on patient's home Trilogy ventilator. Patient complains of difficulty breathing. Daughter stated that there had been a noise coming from the home vent. Spoke with ED physician to see if we could place patient on our ventilator. Patient placed on hospital ventilator at the same home vent settings per MD order. SpO2 98% EtCO2- 43. Breath sounds equal with crackles. Patient has a #6 cuffed Shiley trach in place. Suctioned for small amount tan sputum. Will continue to monitor.

## 2019-08-06 NOTE — ED Provider Notes (Signed)
[facility] Emergency Department Provider Note  ____________________________________________   First MD Initiated Contact with Patient 08/06/19 2317     (approximate)  I have reviewed the triage vital signs and the nursing notes.   HISTORY  Chief Complaint Shortness of Breath    HPI Michelle [name] is a 59 y.o. female with below list of previous medical conditions including COPD tracheostomy with vent presents to the emergency department secondary to increasing difficulty breathing x3 days with fever noted on arrival.  Patient tachycardic as well.  Patient and family tested felt that the patient has been around someone who is Covid positive.  Patient noted to be febrile on presentation tachycardic and tachypneic.        Past Medical History:  Diagnosis Date  . Allergy   . Anxiety   . Asthma   . CHF (congestive heart failure) ([place])   . Cocaine abuse ([place])   . COPD (chronic obstructive pulmonary disease) ([place])   . Coronary artery disease   . Emphysema/COPD ([place])   . Hepatitis C   . High cholesterol   . Hypertension   . Pneumothorax   . Tobacco abuse     Patient Active Problem List   Diagnosis Date Noted  . Pseudomonas respiratory infection 07/03/2019  . Depression with anxiety 07/03/2019  . Hyperlipidemia associated with type 2 diabetes mellitus ([place]) 07/03/2019  . PEG (percutaneous endoscopic gastrostomy) status ([place]) 07/03/2019  . Chronic pain 07/03/2019  . Hypokalemia 07/03/2019  . New onset of type 2 diabetes mellitus in pediatric patient ([place]) 06/24/2019  . Chronic respiratory failure requiring continuous mechanical ventilation through tracheostomy ([place]) 10/20/2018  . CAD (coronary artery disease) 10/20/2018  . HTN (hypertension) 10/20/2018  . Chronic diastolic CHF (congestive heart failure) ([place]) 10/20/2018  . Pneumonia 09/17/2018  . Panic disorder 10/29/2017  . Altered mental status   . Pressure injury of skin 10/23/2017  . Acute  on chronic respiratory failure with hypoxia and hypercapnia ([place]) 06/29/2017  . Acute on chronic respiratory failure with hypoxia ([place]) 02/23/2017  . Protein-calorie malnutrition, severe 05/23/2016  . Sepsis ([place]) 05/22/2016  . Malnutrition of moderate degree ([place]) 10/25/2014  . COPD exacerbation ([place]) 10/24/2014  . Tobacco abuse 10/24/2014  . Community acquired pneumonia 10/24/2014    Past Surgical History:  Procedure Laterality Date  . CARDIAC CATHETERIZATION    . CHEST TUBE INSERTION    . IR GASTROSTOMY TUBE MOD SED  10/31/2017  . TRACHEOSTOMY TUBE PLACEMENT N/A 10/17/2017   Procedure: TRACHEOSTOMY;  Surgeon: Carloyn Manner, MD;  Location: [facility] ORS;  Service: ENT;  Laterality: N/A;    Prior to Admission medications   Medication Sig Start Date End Date Taking? Authorizing Provider  aspirin 81 MG chewable tablet Chew 81 mg by mouth daily.     [provider]  blood glucose meter kit and supplies KIT Dispense based on patient and insurance preference. Use up to four times daily as directed. (FOR ICD-9 250.00, 250.01). 07/05/19   Nicole Kindred A, DO  budesonide (PULMICORT) 0.25 MG/2ML nebulizer solution Take 2 mLs (0.25 mg total) by nebulization 2 (two) times daily. Patient taking differently: Take 0.5 mg by nebulization 2 (two) times daily.  12/31/17   Conforti, John, DO  cetirizine (ZYRTEC) 10 MG tablet Place 10 mg into feeding tube daily.     [provider]  clonazePAM (KLONOPIN) 0.5 MG tablet Take 3 tablets (1.5 mg total) by mouth 3 (three) times daily. Patient taking differently: Take 0.5 mg by  mouth 3 (three) times daily.  07/03/19   Ezekiel Slocumb, DO  Ensure Max Protein (ENSURE MAX PROTEIN) LIQD Take 330 mLs (11 oz total) by mouth 2 (two) times daily. 07/03/19   Ezekiel Slocumb, DO  famotidine (PEPCID) 20 MG tablet Place 1 tablet (20 mg total) into feeding tube daily. 12/31/17   Conforti, John, DO  fentaNYL (DURAGESIC) 25 MCG/HR Place 1 patch onto the skin  every 3 (three) days.    [provider]  fluticasone (FLONASE) 50 MCG/ACT nasal spray Place 1 spray into both nostrils daily.     [provider]  guaiFENesin-dextromethorphan (ROBITUSSIN DM) 100-10 MG/5ML syrup Take 10 mLs by mouth every 6 (six) hours as needed for cough. 07/03/19   Nicole Kindred A, DO  insulin aspart (NOVOLOG) 100 UNIT/ML injection Inject 0-20 Units into the skin 3 (three) times daily with meals. 07/05/19 08/04/19  Nicole Kindred A, DO  insulin detemir (LEVEMIR) 100 UNIT/ML injection Inject 0.25 mLs (25 Units total) into the skin 2 (two) times daily. 07/05/19 08/04/19  Nicole Kindred A, DO  insulin lispro (HUMALOG) 100 UNIT/ML injection Inject 3 Units into the skin 3 (three) times daily with meals. Sliding scale 61-150 = 0 units 151-200= 3 units 201-250= 5 units 251-300 = 10 units  301-350 = 12 units 351-400= 14 units Call MD over 401 units    [provider]  Insulin Pen Needle (PEN NEEDLES) 32G X 6 MM MISC 1 each by Does not apply route 4 (four) times daily - after meals and at bedtime. 07/05/19   Nicole Kindred A, DO  ipratropium-albuterol (DUONEB) 0.5-2.5 (3) MG/3ML SOLN Take 3 mLs by nebulization every 6 (six) hours. Patient taking differently: Take 3 mLs by nebulization every 6 (six) hours as needed (for shortness of breath).  12/31/17   Conforti, John, DO  mirtazapine (REMERON SOL-TAB) 30 MG disintegrating tablet Take 1 tablet (30 mg total) by mouth at bedtime. 07/05/19   Ezekiel Slocumb, DO  oxyCODONE-acetaminophen (PERCOCET/ROXICET) 5-325 MG tablet Take 1 tablet by mouth every 8 (eight) hours as needed.    [provider]  PARoxetine (PAXIL) 20 MG tablet Place 1 tablet (20 mg total) into feeding tube at bedtime. Patient taking differently: Place 20 mg into feeding tube daily.  12/31/17   Conforti, John, DO  polyethylene glycol (MIRALAX / GLYCOLAX) 17 g packet Place 17 g into feeding tube daily.    [provider]  QUEtiapine  (SEROQUEL) 100 MG tablet Place 1 tablet (100 mg total) into feeding tube at bedtime. 07/03/19   Ezekiel Slocumb, DO  senna (SENOKOT) 8.6 MG TABS tablet Place 1-2 tablets into feeding tube at bedtime.     [provider]  simethicone (MYLICON) 80 MG chewable tablet Chew 1 tablet (80 mg total) by mouth every 6 (six) hours as needed for flatulence. 07/05/19   Nicole Kindred A, DO  sodium chloride irrigation 0.9 % irrigation Irrigate with 50 mLs as directed every 6 (six) hours. 07/05/19   Ezekiel Slocumb, DO    Allergies Amoxicillin  Family History  Problem Relation Age of Onset  . Lung cancer Mother   . Asthma Mother   . CVA Father   . CAD Father   . Stroke Father   . Breast cancer Maternal Aunt 67  . COPD Sister     Social History Social History   Tobacco Use  . Smoking status: Former Smoker    Packs/day: 0.10    Years: 41.00  Pack years: 4.10    Types: Cigarettes    Quit date: 06/09/2017    Years since quitting: 2.1  . Smokeless tobacco: Never Used  Substance Use Topics  . Alcohol use: No  . Drug use: Yes    Types: "Crack" cocaine    Comment: Past cocaine use.  Quit 04/2017    Review of Systems Constitutional: No fever/chills Eyes: No visual changes. ENT: No sore throat. Cardiovascular: Denies chest pain. Respiratory: Positive for dyspnea and cough Gastrointestinal: No abdominal pain.  No nausea, no vomiting.  No diarrhea.  No constipation. Genitourinary: Negative for dysuria. Musculoskeletal: Negative for neck pain.  Negative for back pain. Integumentary: Negative for rash. Neurological: Negative for headaches, focal weakness or numbness. Psychiatric:  Positive for anxiety   ____________________________________________   PHYSICAL EXAM:  VITAL SIGNS: ED Triage Vitals  Enc Vitals Group     BP 08/06/19 2246 (!) 227/126     Pulse Rate 08/06/19 2240 (!) 126     Resp 08/06/19 2240 (!) 22     Temp 08/06/19 2240 (!) 100.6 F (38.1 C)     Temp  Source 08/06/19 2240 Oral     SpO2 08/06/19 2236 96 %     Weight 08/06/19 2241 88.9 kg (196 lb)     Height 08/06/19 2241 1.549 m (_0 )     Head Circumference --      Peak Flow --      Pain Score 08/06/19 2241 8     Pain Loc --      Pain Edu? --      Excl. in [place]? --     Constitutional: Alert and oriented.  Eyes: Conjunctivae are normal.  Head: Atraumatic. Mouth/Throat: Tracheostomy site unremarkable Neck: No stridor.  No meningeal signs.   Cardiovascular: Tachycardia, regular rhythm. Good peripheral circulation. Grossly normal heart sounds. Respiratory: Tachypnea, diffuse rhonchi predominately bibasilar.   Gastrointestinal: Soft and nontender. No distention.  Musculoskeletal: No lower extremity tenderness nor edema. No gross deformities of extremities. Neurologic:  . No gross focal neurologic deficits are appreciated.  Skin:  Skin is warm, dry and intact. Psychiatric: Mood and affect are normal. Speech and behavior are normal.  ____________________________________________   LABS (all labs ordered are listed, but only abnormal results are displayed)  Labs Reviewed  CBC WITH DIFFERENTIAL/PLATELET - Abnormal; Notable for the following components:      Result Value   WBC 16.3 (*)    RBC 5.43 (*)    HCT 46.6 (*)    Neutro Abs 13.2 (*)    Monocytes Absolute 1.4 (*)    Abs Immature Granulocytes 0.15 (*)    All other components within normal limits  CULTURE, BLOOD (ROUTINE X 2)  CULTURE, BLOOD (ROUTINE X 2)  URINE CULTURE  RESPIRATORY PANEL BY RT PCR (FLU A&B, COVID)  LACTIC ACID, PLASMA  LACTIC ACID, PLASMA  COMPREHENSIVE METABOLIC PANEL  PROTIME-INR  URINALYSIS, ROUTINE W REFLEX MICROSCOPIC   ____________________________________________  EKG  ED ECG REPORT I, War N Riese Hellard, the attending physician, personally viewed and interpreted this ECG.   Date: 08/06/2019  EKG Time: 10:41 PM  Rate: 129  Rhythm: Sinus tachycardia  Axis: Normal  Intervals: Normal  ST&T  Change: None  ____________________________________________  RADIOLOGY I,  N Neema Fluegge, personally viewed and evaluated these images (plain radiographs) as part of my medical decision making, as well as reviewing the written report by the radiologist.  ED MD interpretation: No active disease noted on chest x-ray per radiologist.  Official  radiology report(s): DG Chest Port 1 View  Result Date: 08/06/2019 CLINICAL DATA:  Respiratory failure and fevers EXAM: PORTABLE CHEST 1 VIEW COMPARISON:  06/27/2019 FINDINGS: Tracheostomy tube is again seen in satisfactory position. Lungs are well aerated bilaterally. No focal infiltrate or sizable effusion is seen. No bony abnormality is noted. Cardiac shadow is within normal limits. No bony abnormality is noted. IMPRESSION: No active disease. Electronically Signed   By: Inez Catalina M.D.   On: 08/06/2019 23:12    ____________________________________________   PROCEDURES   .Critical Care Performed by: Gregor Hams, MD Authorized by: Gregor Hams, MD   Critical care provider statement:    Critical care time (minutes):  30   Critical care time was exclusive of:  Separately billable procedures and treating other patients   Critical care was necessary to treat or prevent imminent or life-threatening deterioration of the following conditions:  Sepsis   Critical care was time spent personally by me on the following activities:  Development of treatment plan with patient or surrogate, discussions with consultants, evaluation of patient's response to treatment, examination of patient, obtaining history from patient or surrogate, ordering and performing treatments and interventions, ordering and review of laboratory studies, ordering and review of radiographic studies, pulse oximetry, re-evaluation of patient's condition and review of old charts     ____________________________________________   [place] / MDM / [place] / ED COURSE  As part of my medical decision making, I reviewed the following data within the electronic MEDICAL RECORD NUMBER  59 year old female presented with above-stated history and physical exam meeting sepsis criteria and as such sepsis work-up was initiated with patient receiving appropriate IV antibiotic therapy.  Concern for possible pneumonia, COVID-19, influenza as etiology for the patient's symptoms.  Patient's Covid and influenza negative.  Chest x-ray does not reveal evidence of pneumonia however possibility of pneumonia still exist.  Patient admitted to the ICU staff for continued management and evaluation  ____________________________________________  FINAL CLINICAL IMPRESSION(S) / ED DIAGNOSES  Sepsis  MEDICATIONS GIVEN DURING THIS VISIT:  Medications  aztreonam (AZACTAM) 2 g in sodium chloride 0.9 % 100 mL IVPB (2 g Intravenous New Bag/Given 08/06/19 2312)  metroNIDAZOLE (FLAGYL) IVPB 500 mg (has no administration in time range)  vancomycin (VANCOCIN) IVPB 1000 mg/200 mL premix (has no administration in time range)  vancomycin (VANCOCIN) IVPB 1000 mg/200 mL premix (has no administration in time range)     ED Discharge Orders    None      *Please note:  TOLEEN LACHAPELLE was evaluated in Emergency Department on 08/06/2019 for the symptoms described in the history of present illness. She was evaluated in the context of the global COVID-19 pandemic, which necessitated consideration that the patient might be at risk for infection with the SARS-CoV-2 virus that causes COVID-19. Institutional protocols and algorithms that pertain to the evaluation of patients at risk for COVID-19 are in a state of rapid change based on information released by regulatory bodies including the CDC and federal and state organizations. These policies and algorithms were followed during the patient's care in the ED.  Some ED evaluations and interventions may be delayed as a result of limited staffing during  the pandemic.*  Note:  This document was prepared using Dragon voice recognition software and may include unintentional dictation errors.   Gregor Hams, MD 08/07/19 [phone]

## 2019-08-06 NOTE — ED Triage Notes (Signed)
PT arrives via EMS from home due to difficulty breathing with ventilator. Patient has trach in place with home vent on arrival. Patient is reported to experience difficulty breathing since Monday and increased secretions requiring suctioning.

## 2019-08-07 ENCOUNTER — Inpatient Hospital Stay: Payer: Medicaid Other

## 2019-08-07 DIAGNOSIS — J189 Pneumonia, unspecified organism: Secondary | ICD-10-CM | POA: Diagnosis not present

## 2019-08-07 DIAGNOSIS — Z801 Family history of malignant neoplasm of trachea, bronchus and lung: Secondary | ICD-10-CM | POA: Diagnosis not present

## 2019-08-07 DIAGNOSIS — A419 Sepsis, unspecified organism: Secondary | ICD-10-CM | POA: Diagnosis present

## 2019-08-07 DIAGNOSIS — Z825 Family history of asthma and other chronic lower respiratory diseases: Secondary | ICD-10-CM | POA: Diagnosis not present

## 2019-08-07 DIAGNOSIS — R570 Cardiogenic shock: Secondary | ICD-10-CM | POA: Diagnosis not present

## 2019-08-07 DIAGNOSIS — J95851 Ventilator associated pneumonia: Secondary | ICD-10-CM | POA: Diagnosis present

## 2019-08-07 DIAGNOSIS — I110 Hypertensive heart disease with heart failure: Secondary | ICD-10-CM | POA: Diagnosis present

## 2019-08-07 DIAGNOSIS — I5032 Chronic diastolic (congestive) heart failure: Secondary | ICD-10-CM | POA: Diagnosis present

## 2019-08-07 DIAGNOSIS — J9620 Acute and chronic respiratory failure, unspecified whether with hypoxia or hypercapnia: Secondary | ICD-10-CM | POA: Diagnosis not present

## 2019-08-07 DIAGNOSIS — J9602 Acute respiratory failure with hypercapnia: Secondary | ICD-10-CM | POA: Diagnosis not present

## 2019-08-07 DIAGNOSIS — Z20822 Contact with and (suspected) exposure to covid-19: Secondary | ICD-10-CM | POA: Diagnosis present

## 2019-08-07 DIAGNOSIS — R6521 Severe sepsis with septic shock: Secondary | ICD-10-CM | POA: Diagnosis present

## 2019-08-07 DIAGNOSIS — J9601 Acute respiratory failure with hypoxia: Secondary | ICD-10-CM | POA: Diagnosis not present

## 2019-08-07 DIAGNOSIS — Z87891 Personal history of nicotine dependence: Secondary | ICD-10-CM | POA: Diagnosis not present

## 2019-08-07 DIAGNOSIS — Z823 Family history of stroke: Secondary | ICD-10-CM | POA: Diagnosis not present

## 2019-08-07 DIAGNOSIS — Z8701 Personal history of pneumonia (recurrent): Secondary | ICD-10-CM | POA: Diagnosis not present

## 2019-08-07 DIAGNOSIS — J690 Pneumonitis due to inhalation of food and vomit: Secondary | ICD-10-CM | POA: Diagnosis present

## 2019-08-07 DIAGNOSIS — J441 Chronic obstructive pulmonary disease with (acute) exacerbation: Secondary | ICD-10-CM | POA: Diagnosis not present

## 2019-08-07 DIAGNOSIS — Y848 Other medical procedures as the cause of abnormal reaction of the patient, or of later complication, without mention of misadventure at the time of the procedure: Secondary | ICD-10-CM | POA: Diagnosis present

## 2019-08-07 DIAGNOSIS — Z930 Tracheostomy status: Secondary | ICD-10-CM | POA: Diagnosis not present

## 2019-08-07 DIAGNOSIS — J9622 Acute and chronic respiratory failure with hypercapnia: Secondary | ICD-10-CM | POA: Diagnosis present

## 2019-08-07 DIAGNOSIS — I2510 Atherosclerotic heart disease of native coronary artery without angina pectoris: Secondary | ICD-10-CM | POA: Diagnosis present

## 2019-08-07 DIAGNOSIS — Z8249 Family history of ischemic heart disease and other diseases of the circulatory system: Secondary | ICD-10-CM | POA: Diagnosis not present

## 2019-08-07 DIAGNOSIS — F419 Anxiety disorder, unspecified: Secondary | ICD-10-CM | POA: Diagnosis present

## 2019-08-07 DIAGNOSIS — J9621 Acute and chronic respiratory failure with hypoxia: Secondary | ICD-10-CM | POA: Diagnosis present

## 2019-08-07 DIAGNOSIS — Z9911 Dependence on respirator [ventilator] status: Secondary | ICD-10-CM | POA: Diagnosis not present

## 2019-08-07 DIAGNOSIS — B1920 Unspecified viral hepatitis C without hepatic coma: Secondary | ICD-10-CM | POA: Diagnosis present

## 2019-08-07 DIAGNOSIS — R140 Abdominal distension (gaseous): Secondary | ICD-10-CM | POA: Diagnosis present

## 2019-08-07 DIAGNOSIS — Z66 Do not resuscitate: Secondary | ICD-10-CM | POA: Diagnosis present

## 2019-08-07 DIAGNOSIS — E7800 Pure hypercholesterolemia, unspecified: Secondary | ICD-10-CM | POA: Diagnosis present

## 2019-08-07 LAB — GLUCOSE, CAPILLARY
Glucose-Capillary: 214 mg/dL — ABNORMAL HIGH (ref 70–99)
Glucose-Capillary: 146 mg/dL — ABNORMAL HIGH (ref 70–99)
Glucose-Capillary: 138 mg/dL — ABNORMAL HIGH (ref 70–99)
Glucose-Capillary: 119 mg/dL — ABNORMAL HIGH (ref 70–99)
Glucose-Capillary: 107 mg/dL — ABNORMAL HIGH (ref 70–99)
Glucose-Capillary: 118 mg/dL — ABNORMAL HIGH (ref 70–99)
Glucose-Capillary: 161 mg/dL — ABNORMAL HIGH (ref 70–99)

## 2019-08-07 LAB — URINALYSIS, ROUTINE W REFLEX MICROSCOPIC
Bilirubin Urine: NEGATIVE
Glucose, UA: 50 mg/dL — AB
Hgb urine dipstick: NEGATIVE
Ketones, ur: NEGATIVE mg/dL
Leukocytes,Ua: NEGATIVE
Nitrite: NEGATIVE
Protein, ur: >=300 mg/dL — AB
Specific Gravity, Urine: 1.01 (ref 1.005–1.030)
pH: 6 (ref 5.0–8.0)

## 2019-08-07 LAB — CBC
HCT: 41.3 % (ref 36.0–46.0)
Hemoglobin: 12.6 g/dL (ref 12.0–15.0)
MCH: 26.1 pg (ref 26.0–34.0)
MCHC: 30.5 g/dL (ref 30.0–36.0)
MCV: 85.5 fL (ref 80.0–100.0)
Platelets: 265 10*3/uL (ref 150–400)
RBC: 4.83 MIL/uL (ref 3.87–5.11)
RDW: 15.2 % (ref 11.5–15.5)
WBC: 13.9 10*3/uL — ABNORMAL HIGH (ref 4.0–10.5)
nRBC: 0 % (ref 0.0–0.2)

## 2019-08-07 LAB — URINE DRUG SCREEN, QUALITATIVE (ARMC ONLY)
Amphetamines, Ur Screen: NOT DETECTED
Barbiturates, Ur Screen: NOT DETECTED
Benzodiazepine, Ur Scrn: POSITIVE — AB
Cannabinoid 50 Ng, Ur ~~LOC~~: NOT DETECTED
Cocaine Metabolite,Ur ~~LOC~~: NOT DETECTED
MDMA (Ecstasy)Ur Screen: NOT DETECTED
Methadone Scn, Ur: NOT DETECTED
Opiate, Ur Screen: POSITIVE — AB
Phencyclidine (PCP) Ur S: NOT DETECTED
Tricyclic, Ur Screen: NOT DETECTED

## 2019-08-07 LAB — BASIC METABOLIC PANEL
Anion gap: 6 (ref 5–15)
BUN: 12 mg/dL (ref 6–20)
CO2: 26 mmol/L (ref 22–32)
Calcium: 8.5 mg/dL — ABNORMAL LOW (ref 8.9–10.3)
Chloride: 109 mmol/L (ref 98–111)
Creatinine, Ser: 1 mg/dL (ref 0.44–1.00)
GFR calc Af Amer: >60 mL/min (ref >60)
GFR calc non Af Amer: >60 mL/min (ref >60)
Glucose, Bld: 159 mg/dL — ABNORMAL HIGH (ref 70–99)
Potassium: 4.2 mmol/L (ref 3.5–5.1)
Sodium: 141 mmol/L (ref 135–145)

## 2019-08-07 LAB — TROPONIN I (HIGH SENSITIVITY)
Troponin I (High Sensitivity): 36 ng/L — ABNORMAL HIGH (ref <18)
Troponin I (High Sensitivity): 61 ng/L — ABNORMAL HIGH (ref <18)

## 2019-08-07 LAB — MRSA PCR SCREENING: MRSA by PCR: POSITIVE — AB

## 2019-08-07 LAB — RESPIRATORY PANEL BY RT PCR (FLU A&B, COVID)
Influenza A by PCR: NEGATIVE
Influenza B by PCR: NEGATIVE
SARS Coronavirus 2 by RT PCR: NEGATIVE

## 2019-08-07 LAB — PROTIME-INR
INR: 1 (ref 0.8–1.2)
Prothrombin Time: 13 seconds (ref 11.4–15.2)

## 2019-08-07 LAB — PROCALCITONIN: Procalcitonin: 0.59 ng/mL

## 2019-08-07 LAB — LACTIC ACID, PLASMA: Lactic Acid, Venous: 1 mmol/L (ref 0.5–1.9)

## 2019-08-07 MED ORDER — ACETAMINOPHEN 325 MG PO TABS
650.0000 mg | ORAL_TABLET | ORAL | Status: DC | PRN
  Administered 2019-08-07 – 2019-08-08 (×2): 650 mg
  Filled 2019-08-07 (×2): qty 2

## 2019-08-07 MED ORDER — ASPIRIN 81 MG PO CHEW
81.0000 mg | CHEWABLE_TABLET | Freq: Every day | ORAL | Status: DC
  Administered 2019-08-07 – 2019-08-12 (×6): 81 mg
  Filled 2019-08-07 (×6): qty 1

## 2019-08-07 MED ORDER — SENNA 8.6 MG PO TABS: 1.0000 | ORAL_TABLET | Freq: Every evening | ORAL | Status: DC | PRN

## 2019-08-07 MED ORDER — MORPHINE SULFATE (PF) 2 MG/ML IV SOLN
1.0000 mg | INTRAVENOUS | Status: DC | PRN
  Administered 2019-08-07 – 2019-08-09 (×4): 1 mg via INTRAVENOUS
  Administered 2019-08-10 – 2019-08-11 (×2): 2 mg via INTRAVENOUS
  Filled 2019-08-07 (×6): qty 1

## 2019-08-07 MED ORDER — CHLORHEXIDINE GLUCONATE CLOTH 2 % EX PADS
6.0000 | MEDICATED_PAD | Freq: Every day | CUTANEOUS | Status: AC
  Administered 2019-08-07 – 2019-08-10 (×2): 6 via TOPICAL

## 2019-08-07 MED ORDER — LACTATED RINGERS IV BOLUS
1000.0000 mL | Freq: Once | INTRAVENOUS | Status: AC
  Administered 2019-08-07: 1000 mL via INTRAVENOUS

## 2019-08-07 MED ORDER — ACETAMINOPHEN 325 MG PO TABS: 650.0000 mg | ORAL_TABLET | ORAL | Status: DC | PRN

## 2019-08-07 MED ORDER — MIRTAZAPINE 15 MG PO TBDP
30.0000 mg | ORAL_TABLET | Freq: Every day | ORAL | Status: DC
  Filled 2019-08-07: qty 2

## 2019-08-07 MED ORDER — VANCOMYCIN HCL IN DEXTROSE 1-5 GM/200ML-% IV SOLN
1000.0000 mg | INTRAVENOUS | Status: DC
  Administered 2019-08-07 – 2019-08-09 (×3): 1000 mg via INTRAVENOUS
  Filled 2019-08-07 (×6): qty 200

## 2019-08-07 MED ORDER — POLYETHYLENE GLYCOL 3350 17 G PO PACK
17.0000 g | PACK | Freq: Every day | ORAL | Status: DC
  Administered 2019-08-07: 17 g
  Filled 2019-08-07 (×3): qty 1

## 2019-08-07 MED ORDER — ASPIRIN 81 MG PO CHEW: 81.0000 mg | CHEWABLE_TABLET | Freq: Every day | ORAL | Status: DC

## 2019-08-07 MED ORDER — CLONAZEPAM 1 MG PO TABS
1.5000 mg | ORAL_TABLET | Freq: Three times a day (TID) | ORAL | Status: DC
  Administered 2019-08-07 – 2019-08-08 (×3): 1.5 mg via ORAL
  Filled 2019-08-07 (×3): qty 1

## 2019-08-07 MED ORDER — OXYCODONE-ACETAMINOPHEN 5-325 MG PO TABS
1.0000 | ORAL_TABLET | Freq: Three times a day (TID) | ORAL | Status: DC | PRN
  Administered 2019-08-07 – 2019-08-11 (×3): 1
  Filled 2019-08-07 (×3): qty 1

## 2019-08-07 MED ORDER — LORATADINE 10 MG PO TABS
10.0000 mg | ORAL_TABLET | Freq: Every day | ORAL | Status: DC
  Administered 2019-08-07 – 2019-08-12 (×6): 10 mg
  Filled 2019-08-07 (×6): qty 1

## 2019-08-07 MED ORDER — POTASSIUM CHLORIDE 20 MEQ/15ML (10%) PO SOLN
20.0000 meq | Freq: Once | ORAL | Status: AC
  Administered 2019-08-07: 03:00:00 20 meq
  Filled 2019-08-07: qty 15

## 2019-08-07 MED ORDER — LABETALOL HCL 5 MG/ML IV SOLN
10.0000 mg | INTRAVENOUS | Status: DC | PRN
  Administered 2019-08-08 – 2019-08-10 (×2): 10 mg via INTRAVENOUS
  Filled 2019-08-07 (×2): qty 4

## 2019-08-07 MED ORDER — LABETALOL HCL 5 MG/ML IV SOLN
INTRAVENOUS | Status: AC
  Administered 2019-08-07: 10 mg via INTRAVENOUS
  Filled 2019-08-07: qty 4

## 2019-08-07 MED ORDER — SODIUM CHLORIDE 0.9 % IV SOLN
1.0000 g | Freq: Three times a day (TID) | INTRAVENOUS | Status: DC
  Filled 2019-08-07: qty 1

## 2019-08-07 MED ORDER — IPRATROPIUM-ALBUTEROL 0.5-2.5 (3) MG/3ML IN SOLN
3.0000 mL | RESPIRATORY_TRACT | Status: DC | PRN
  Administered 2019-08-07 – 2019-08-09 (×5): 3 mL via RESPIRATORY_TRACT
  Filled 2019-08-07 (×5): qty 3

## 2019-08-07 MED ORDER — ONDANSETRON HCL 4 MG/2ML IJ SOLN
4.0000 mg | Freq: Four times a day (QID) | INTRAMUSCULAR | Status: DC | PRN
  Administered 2019-08-09: 4 mg via INTRAVENOUS
  Filled 2019-08-07: qty 2

## 2019-08-07 MED ORDER — LACTATED RINGERS IV SOLN: INTRAVENOUS | Status: DC

## 2019-08-07 MED ORDER — MUPIROCIN 2 % EX OINT
1.0000 "application " | TOPICAL_OINTMENT | Freq: Two times a day (BID) | CUTANEOUS | Status: AC
  Administered 2019-08-07 – 2019-08-11 (×10): 1 via NASAL
  Filled 2019-08-07 (×2): qty 22

## 2019-08-07 MED ORDER — CHLORHEXIDINE GLUCONATE CLOTH 2 % EX PADS
6.0000 | MEDICATED_PAD | Freq: Every day | CUTANEOUS | Status: DC
  Filled 2019-08-07: qty 6

## 2019-08-07 MED ORDER — ENOXAPARIN SODIUM 40 MG/0.4ML ~~LOC~~ SOLN
40.0000 mg | SUBCUTANEOUS | Status: DC
  Administered 2019-08-07 – 2019-08-12 (×6): 40 mg via SUBCUTANEOUS
  Filled 2019-08-07 (×6): qty 0.4

## 2019-08-07 MED ORDER — VANCOMYCIN HCL 1250 MG/250ML IV SOLN
1250.0000 mg | INTRAVENOUS | Status: DC
  Filled 2019-08-07: qty 250

## 2019-08-07 MED ORDER — SODIUM CHLORIDE 0.9 % IV SOLN
2.0000 g | Freq: Three times a day (TID) | INTRAVENOUS | Status: DC
  Administered 2019-08-07 – 2019-08-12 (×16): 2 g via INTRAVENOUS
  Filled 2019-08-07 (×18): qty 2

## 2019-08-07 MED ORDER — OXYCODONE HCL 5 MG PO TABS
5.0000 mg | ORAL_TABLET | Freq: Every day | ORAL | Status: DC
  Administered 2019-08-07 – 2019-08-11 (×5): 5 mg via ORAL
  Filled 2019-08-07 (×5): qty 1

## 2019-08-07 MED ORDER — MIRTAZAPINE 15 MG PO TBDP
30.0000 mg | ORAL_TABLET | Freq: Every day | ORAL | Status: DC
  Administered 2019-08-07 – 2019-08-12 (×6): 30 mg
  Filled 2019-08-07 (×7): qty 2

## 2019-08-07 MED ORDER — OXYCODONE-ACETAMINOPHEN 5-325 MG PO TABS: 1.0000 | ORAL_TABLET | Freq: Three times a day (TID) | ORAL | Status: DC | PRN

## 2019-08-07 MED ORDER — SIMETHICONE 80 MG PO CHEW
80.0000 mg | CHEWABLE_TABLET | Freq: Four times a day (QID) | ORAL | Status: DC | PRN
  Filled 2019-08-07: qty 1

## 2019-08-07 MED ORDER — FAMOTIDINE IN NACL 20-0.9 MG/50ML-% IV SOLN
20.0000 mg | Freq: Two times a day (BID) | INTRAVENOUS | Status: DC
  Administered 2019-08-07 – 2019-08-09 (×7): 20 mg via INTRAVENOUS
  Filled 2019-08-07 (×8): qty 50

## 2019-08-07 MED ORDER — INSULIN ASPART 100 UNIT/ML ~~LOC~~ SOLN
0.0000 [IU] | SUBCUTANEOUS | Status: DC
  Administered 2019-08-07: 5 [IU] via SUBCUTANEOUS
  Administered 2019-08-07: 2 [IU] via SUBCUTANEOUS
  Administered 2019-08-07: 3 [IU] via SUBCUTANEOUS
  Administered 2019-08-08 (×2): 2 [IU] via SUBCUTANEOUS
  Administered 2019-08-09: 3 [IU] via SUBCUTANEOUS
  Administered 2019-08-10: 2 [IU] via SUBCUTANEOUS
  Administered 2019-08-10: 3 [IU] via SUBCUTANEOUS
  Administered 2019-08-10: 5 [IU] via SUBCUTANEOUS
  Administered 2019-08-10: 3 [IU] via SUBCUTANEOUS
  Administered 2019-08-10: 5 [IU] via SUBCUTANEOUS
  Administered 2019-08-10 (×2): 3 [IU] via SUBCUTANEOUS
  Administered 2019-08-11 (×2): 8 [IU] via SUBCUTANEOUS
  Administered 2019-08-11: 2 [IU] via SUBCUTANEOUS
  Administered 2019-08-12: 3 [IU] via SUBCUTANEOUS
  Administered 2019-08-12: 2 [IU] via SUBCUTANEOUS
  Administered 2019-08-12: 5 [IU] via SUBCUTANEOUS
  Administered 2019-08-12: 2 [IU] via SUBCUTANEOUS
  Filled 2019-08-07 (×20): qty 1

## 2019-08-07 MED ORDER — LORATADINE 10 MG PO TABS: 10.0000 mg | ORAL_TABLET | Freq: Every day | ORAL | Status: DC

## 2019-08-07 MED ORDER — GUAIFENESIN 100 MG/5ML PO SOLN
5.0000 mL | ORAL | Status: DC | PRN
  Administered 2019-08-07 – 2019-08-11 (×12): 100 mg via ORAL
  Filled 2019-08-07 (×16): qty 5

## 2019-08-07 MED ORDER — LACTATED RINGERS IV BOLUS
500.0000 mL | Freq: Once | INTRAVENOUS | Status: AC
  Administered 2019-08-07: 500 mL via INTRAVENOUS

## 2019-08-07 MED ORDER — PAROXETINE HCL 20 MG PO TABS
20.0000 mg | ORAL_TABLET | Freq: Every evening | ORAL | Status: DC
  Administered 2019-08-07 – 2019-08-11 (×5): 20 mg
  Filled 2019-08-07 (×6): qty 1

## 2019-08-07 MED ORDER — BUDESONIDE 0.5 MG/2ML IN SUSP
0.5000 mg | Freq: Two times a day (BID) | RESPIRATORY_TRACT | Status: DC
  Administered 2019-08-07 – 2019-08-08 (×3): 0.5 mg via RESPIRATORY_TRACT
  Filled 2019-08-07 (×3): qty 2

## 2019-08-07 MED ORDER — DEXMEDETOMIDINE HCL IN NACL 400 MCG/100ML IV SOLN
0.4000 ug/kg/h | INTRAVENOUS | Status: DC
  Administered 2019-08-07: 0.4 ug/kg/h via INTRAVENOUS
  Filled 2019-08-07: qty 100

## 2019-08-07 MED ORDER — LABETALOL HCL 5 MG/ML IV SOLN
10.0000 mg | Freq: Once | INTRAVENOUS | Status: AC
  Administered 2019-08-07: 01:00:00 10 mg via INTRAVENOUS

## 2019-08-07 MED ORDER — LORAZEPAM 2 MG/ML IJ SOLN
1.0000 mg | INTRAMUSCULAR | Status: DC | PRN
  Filled 2019-08-07 (×2): qty 1

## 2019-08-07 MED ORDER — QUETIAPINE FUMARATE 100 MG PO TABS
100.0000 mg | ORAL_TABLET | Freq: Every day | ORAL | Status: DC
  Administered 2019-08-07 – 2019-08-11 (×6): 100 mg
  Filled 2019-08-07 (×6): qty 1

## 2019-08-07 MED ORDER — CLONAZEPAM 0.5 MG PO TABS
0.5000 mg | ORAL_TABLET | Freq: Three times a day (TID) | ORAL | Status: DC
  Administered 2019-08-07: 0.5 mg
  Filled 2019-08-07: qty 1

## 2019-08-07 NOTE — H&P (Signed)
Name: Michelle [name] MRN: [id number] DOB: 20-May-1961    ADMISSION DATE:  08/06/2019 CONSULTATION DATE:  08/07/2019  REFERRING MD :  Dr. Owens Shark  CHIEF COMPLAINT:  Shortness of Breath  BRIEF PATIENT DESCRIPTION:  59 year old female, DNR/DNI, with past medical history notable for severe COPD requiring chronic trach who is admitted 08/07/19 for acute respiratory failure and sepsis secondary to suspected HCAP/VAP.   SIGNIFICANT EVENTS  3/5- Admission to ICU  STUDIES:  3/4- CXR: Tracheostomy tube is again seen in satisfactory position. Lungs are well aerated bilaterally. No focal infiltrate or sizable effusion is seen. No bony abnormality is noted. Cardiac shadow is within normal limits. No bony abnormality is noted. 3/5: KUB: No acute abnormality noted.  CULTURES: SARS-CoV-2 PCR 3/4>> negative Influenza PCR 3/4>> negative Blood culture x2 3/4>> Tracheal aspirate 3/5>> Strep pneumo urinary antigen 3/5>> Legionella urinary antigen 3/5>>  ANTIBIOTICS: Aztreonam 3/4 x dose Flagyl 3/4 x 1 dose Ceftazidime 3/5>> Vancomycin 3/4>>  PREVIOUS HPI Michelle [name] is a 59 year old female, DNR/DNI, with a past medical history notable for severe COPD with chronic tracheostomy, asthma, CHF, cocaine abuse, CAD, hypertension, hepatitis C, pneumothorax, tobacco abuse who presents to [facility] ED on 08/06/2019 with complaints of progressive shortness of breath, cough, and sputum production that began approximately 3 days ago.  She reports the development of fever today.  She does endorse a positive Covid contact, as her husband's nephew was diagnosed with Covid earlier today.  Upon presentation to the ED she was noted to be febrile with temperature 100.6, tachycardic, hypertensive, and in acute respiratory distress on her home ventilator.  She was switched over to the hospital ventilator and given 5 mg of Valium.  Initial work-up in the ED revealed WBC 16.3 with neutrophilia, lactic acid 1.4, potassium 3.4, glucose 165,  and alkaline phosphatase 134.  All other lab work-up to this point is unremarkable.  Her COVID-19 PCR is negative, influenza PCR is negative.  Chest x-ray with no active disease currently.  Urinalysis is currently pending. She met sepsis criteria therefore she was given broad-spectrum antibiotics with aztreonam, Flagyl and vancomycin.  PCCM is consulted to admit the patient to ICU for further work-up and treatment of acute respiratory failure and sepsis secondary to suspected HCAP/VAP.  Her abdomen is noted to be distended, taut, and tender.  KUB is negative for any acute abdominal abnormality.  Of note she has had multiple admissions for pneumonia (11/2017 & 06/27/2019) which have grown Pseudomonas aerogenes (resistant to Cipro)  & Serratia which were both sensitive to Ceftazidime.   HPI Remains on chronic Vent Febrile episodes H/o cocaine abuse in the past  PAST MEDICAL HISTORY :   has a past medical history of Allergy, Anxiety, Asthma, CHF (congestive heart failure) ([place]), Cocaine abuse ([place]), COPD (chronic obstructive pulmonary disease) ([place]), Coronary artery disease, Emphysema/COPD ([place]), Hepatitis C, High cholesterol, Hypertension, Pneumothorax, and Tobacco abuse.  has a past surgical history that includes Cardiac catheterization; Chest tube insertion; Tracheostomy tube placement (N/A, 10/17/2017); and IR GASTROSTOMY TUBE MOD SED (10/31/2017). Prior to Admission medications   Medication Sig Start Date End Date Taking? Authorizing Provider  aspirin 81 MG chewable tablet Chew 81 mg by mouth daily.     [provider]  blood glucose meter kit and supplies KIT Dispense based on patient and insurance preference. Use up to four times daily as directed. (FOR ICD-9 250.00, 250.01). 07/05/19   Nicole Kindred A, DO  budesonide (PULMICORT) 0.25 MG/2ML nebulizer solution Take 2 mLs (0.25 mg total)  by nebulization 2 (two) times daily. Patient taking differently: Take 0.5 mg by nebulization 2 (two)  times daily.  12/31/17   Conforti, John, DO  cetirizine (ZYRTEC) 10 MG tablet Place 10 mg into feeding tube daily.     [provider]  clonazePAM (KLONOPIN) 0.5 MG tablet Take 3 tablets (1.5 mg total) by mouth 3 (three) times daily. Patient taking differently: Take 0.5 mg by mouth 3 (three) times daily.  07/03/19   Ezekiel Slocumb, DO  Ensure Max Protein (ENSURE MAX PROTEIN) LIQD Take 330 mLs (11 oz total) by mouth 2 (two) times daily. 07/03/19   Ezekiel Slocumb, DO  famotidine (PEPCID) 20 MG tablet Place 1 tablet (20 mg total) into feeding tube daily. 12/31/17   Conforti, John, DO  fentaNYL (DURAGESIC) 25 MCG/HR Place 1 patch onto the skin every 3 (three) days.    [provider]  fluticasone (FLONASE) 50 MCG/ACT nasal spray Place 1 spray into both nostrils daily.     [provider]  guaiFENesin-dextromethorphan (ROBITUSSIN DM) 100-10 MG/5ML syrup Take 10 mLs by mouth every 6 (six) hours as needed for cough. 07/03/19   Nicole Kindred A, DO  insulin aspart (NOVOLOG) 100 UNIT/ML injection Inject 0-20 Units into the skin 3 (three) times daily with meals. 07/05/19 08/04/19  Nicole Kindred A, DO  insulin detemir (LEVEMIR) 100 UNIT/ML injection Inject 0.25 mLs (25 Units total) into the skin 2 (two) times daily. 07/05/19 08/04/19  Nicole Kindred A, DO  insulin lispro (HUMALOG) 100 UNIT/ML injection Inject 3 Units into the skin 3 (three) times daily with meals. Sliding scale 61-150 = 0 units 151-200= 3 units 201-250= 5 units 251-300 = 10 units  301-350 = 12 units 351-400= 14 units Call MD over 401 units    [provider]  Insulin Pen Needle (PEN NEEDLES) 32G X 6 MM MISC 1 each by Does not apply route 4 (four) times daily - after meals and at bedtime. 07/05/19   Nicole Kindred A, DO  ipratropium-albuterol (DUONEB) 0.5-2.5 (3) MG/3ML SOLN Take 3 mLs by nebulization every 6 (six) hours. Patient taking differently: Take 3 mLs by nebulization every 6 (six) hours as needed  (for shortness of breath).  12/31/17   Conforti, John, DO  mirtazapine (REMERON SOL-TAB) 30 MG disintegrating tablet Take 1 tablet (30 mg total) by mouth at bedtime. 07/05/19   Ezekiel Slocumb, DO  oxyCODONE-acetaminophen (PERCOCET/ROXICET) 5-325 MG tablet Take 1 tablet by mouth every 8 (eight) hours as needed.    [provider]  PARoxetine (PAXIL) 20 MG tablet Place 1 tablet (20 mg total) into feeding tube at bedtime. Patient taking differently: Place 20 mg into feeding tube daily.  12/31/17   Conforti, John, DO  polyethylene glycol (MIRALAX / GLYCOLAX) 17 g packet Place 17 g into feeding tube daily.    [provider]  QUEtiapine (SEROQUEL) 100 MG tablet Place 1 tablet (100 mg total) into feeding tube at bedtime. 07/03/19   Ezekiel Slocumb, DO  senna (SENOKOT) 8.6 MG TABS tablet Place 1-2 tablets into feeding tube at bedtime.     [provider]  simethicone (MYLICON) 80 MG chewable tablet Chew 1 tablet (80 mg total) by mouth every 6 (six) hours as needed for flatulence. 07/05/19   Nicole Kindred A, DO  sodium chloride irrigation 0.9 % irrigation Irrigate with 50 mLs as directed every 6 (six) hours. 07/05/19   Ezekiel Slocumb, DO   Allergies  Allergen Reactions  . Amoxicillin  Patient has tolerated cephalosporins in the past    FAMILY HISTORY:  family history includes Asthma in her mother; Breast cancer (age of onset: 5) in her maternal aunt; CAD in her father; COPD in her sister; CVA in her father; Lung cancer in her mother; Stroke in her father. SOCIAL HISTORY:  reports that she quit smoking about 2 years ago. Her smoking use included cigarettes. She has a 4.10 pack-year smoking history. She has never used smokeless tobacco. She reports current drug use. Drug: "Crack" cocaine. She reports that she does not drink alcohol.     REVIEW OF SYSTEMS:  Positives in BOLD Constitutional: Negative for +fever, chills, weight loss, malaise/fatigue and +diaphoresis.    HENT: s/p trach  Eyes: Negative for blurred vision, double vision, photophobia, pain, discharge and redness.  Respiratory: Negative for +cough, hemoptysis, +sputum production, +shortness of breath  Gastrointestinal:  +abdominal pain, +diarrhea   VITAL SIGNS: Temp:  [97.8 F (36.6 C)-100.6 F (38.1 C)] 98.2 F (36.8 C) (03/05 0400) Pulse Rate:  [95-140] 96 (03/05 0107) Resp:  [20-22] 21 (03/05 0107) BP: (169-234)/(96-126) 170/98 (03/05 0107) SpO2:  [96 %-98 %] 98 % (03/05 0107) FiO2 (%):  [36 %] 36 % (03/05 0409) Weight:  [83.6 kg-88.9 kg] 83.6 kg (03/05 0200)  PHYSICAL EXAMINATION:  GENERAL:critically ill appearing,  HEAD: Normocephalic, atraumatic.  EYES: Pupils equal, round, reactive to light.  No scleral icterus.  MOUTH: Moist mucosal membrane. NECK: Supple. No thyromegaly. No nodules. No JVD.  PULMONARY: +rhonchi, +wheezing CARDIOVASCULAR: S1 and S2. Regular rate and rhythm. No murmurs, rubs, or gallops.  GASTROINTESTINAL: Soft, nontender, -distended. Positive bowel sounds.  MUSCULOSKELETAL: No swelling, clubbing, or edema.  NEUROLOGIC: Awake, alert and oriented x4, follows commands, no focal deficits, pupils PERRLA  SKIN:intact,warm,dry     Recent Labs  Lab 08/06/19 2249 08/07/19 0520  NA 141 141  K 3.4* 4.2  CL 109 109  CO2 22 26  BUN 12 12  CREATININE 0.69 1.00  GLUCOSE 165* 159*   Recent Labs  Lab 08/06/19 2249 08/07/19 0520  HGB 14.1 12.6  HCT 46.6* 41.3  WBC 16.3* 13.9*  PLT 270 265   DG Abd 1 View  Result Date: 08/07/2019 CLINICAL DATA:  Abdominal distension EXAM: ABDOMEN - 1 VIEW COMPARISON:  None. FINDINGS: Scattered large and small bowel gas is noted. Gastrostomy catheter is noted within the stomach. The stomach is well distended with air. No free air is seen. Changes of prior tubal ligation are noted. No acute bony abnormality is seen. IMPRESSION: No acute abnormality noted. Electronically Signed   By: Inez Catalina M.D.   On: 08/07/2019 02:23    DG Chest Port 1 View  Result Date: 08/06/2019 CLINICAL DATA:  Respiratory failure and fevers EXAM: PORTABLE CHEST 1 VIEW COMPARISON:  06/27/2019 FINDINGS: Tracheostomy tube is again seen in satisfactory position. Lungs are well aerated bilaterally. No focal infiltrate or sizable effusion is seen. No bony abnormality is noted. Cardiac shadow is within normal limits. No bony abnormality is noted. IMPRESSION: No active disease. Electronically Signed   By: Inez Catalina M.D.   On: 08/06/2019 23:12    ASSESSMENT / PLAN:   Severe ACUTE Hypoxic and Hypercapnic Respiratory Failure From HCAP/VAP aspiration pneumonia S/p trach and vent dependent resp failure -continue Mechanical Ventilator support -continue Bronchodilator Therapy -Wean Fio2 and PEEP as tolerated -VAP/VENT bundle implementation --Continue Ceftazidime & Vancomycin -Consider CT chest  SEVERE COPD EXACERBATION -continue IV steroids as prescribed -continue NEB THERAPY as prescribed -morphine as needed -  wean fio2 as needed and tolerated   Abdominal distention Hx: Hepatitis C NPO for now  ELECTROLYTES -follow labs as needed -replace as needed -pharmacy consultation and following    DVT/GI PRX ordered TRANSFUSIONS AS NEEDED MONITOR FSBS ASSESS the need for LABS as needed  ANXIETY H/o COCAINE Check UDS On precedex    DISPOSITION: ICU GOALS OF CARE: DNR/DNI VTE PROPHYLAXIS: Subcu Lovenox SUP PROPHYLAXIS: IV Pepcid UPDATES: Updated patient and daughter at bedside 08/07/2019    Critical Care Time devoted to patient care services described in this note is 45 minutes.     Corrin Parker, M.D.  [facility]  Medical Director [place] Director [facility] Cardio-Pulmonary Department

## 2019-08-07 NOTE — Progress Notes (Signed)
Name: Michelle [name] MRN: [id number] DOB: 11/01/1960    ADMISSION DATE:  08/06/2019 CONSULTATION DATE:  08/07/2019  REFERRING MD :  Dr. Owens Shark  CHIEF COMPLAINT:  Shortness of Breath  BRIEF PATIENT DESCRIPTION:  59 year old female, DNR/DNI, with past medical history notable for severe COPD requiring chronic trach who is admitted 08/07/19 for acute respiratory failure and sepsis secondary to suspected HCAP/VAP.   SIGNIFICANT EVENTS  3/5- Admission to ICU  STUDIES:  3/4- CXR: Tracheostomy tube is again seen in satisfactory position. Lungs are well aerated bilaterally. No focal infiltrate or sizable effusion is seen. No bony abnormality is noted. Cardiac shadow is within normal limits. No bony abnormality is noted. 3/5: KUB: No acute abnormality noted.  CULTURES: SARS-CoV-2 PCR 3/4>> negative Influenza PCR 3/4>> negative Blood culture x2 3/4>> Tracheal aspirate 3/5>> Strep pneumo urinary antigen 3/5>> Legionella urinary antigen 3/5>>  ANTIBIOTICS: Aztreonam 3/4 x dose Flagyl 3/4 x 1 dose Ceftazidime 3/5>> Vancomycin 3/4>>   HISTORY OF PRESENT ILLNESS:   Michelle [name] is a 59 year old female, DNR/DNI, with a past medical history notable for severe COPD with chronic tracheostomy, asthma, CHF, cocaine abuse, CAD, hypertension, hepatitis C, pneumothorax, tobacco abuse who presents to [facility] ED on 08/06/2019 with complaints of progressive shortness of breath, cough, and sputum production that began approximately 3 days ago.  She reports the development of fever today.  She does endorse a positive Covid contact, as her husband's nephew was diagnosed with Covid earlier today.  Upon presentation to the ED she was noted to be febrile with temperature 100.6, tachycardic, hypertensive, and in acute respiratory distress on her home ventilator.  She was switched over to the hospital ventilator and given 5 mg of Valium.  Initial work-up in the ED revealed WBC 16.3 with neutrophilia, lactic acid 1.4,  potassium 3.4, glucose 165, and alkaline phosphatase 134.  All other lab work-up to this point is unremarkable.  Her COVID-19 PCR is negative, influenza PCR is negative.  Chest x-ray with no active disease currently.  Urinalysis is currently pending. She met sepsis criteria therefore she was given broad-spectrum antibiotics with aztreonam, Flagyl and vancomycin.  PCCM is consulted to admit the patient to ICU for further work-up and treatment of acute respiratory failure and sepsis secondary to suspected HCAP/VAP.  Her abdomen is noted to be distended, taut, and tender.  KUB is negative for any acute abdominal abnormality.  Of note she has had multiple admissions for pneumonia (11/2017 & 06/27/2019) which have grown Pseudomonas aerogenes (resistant to Cipro)  & Serratia which were both sensitive to Ceftazidime.  PAST MEDICAL HISTORY :   has a past medical history of Allergy, Anxiety, Asthma, CHF (congestive heart failure) ([place]), Cocaine abuse ([place]), COPD (chronic obstructive pulmonary disease) ([place]), Coronary artery disease, Emphysema/COPD ([place]), Hepatitis C, High cholesterol, Hypertension, Pneumothorax, and Tobacco abuse.  has a past surgical history that includes Cardiac catheterization; Chest tube insertion; Tracheostomy tube placement (N/A, 10/17/2017); and IR GASTROSTOMY TUBE MOD SED (10/31/2017). Prior to Admission medications   Medication Sig Start Date End Date Taking? Authorizing Provider  aspirin 81 MG chewable tablet Chew 81 mg by mouth daily.     [provider]  blood glucose meter kit and supplies KIT Dispense based on patient and insurance preference. Use up to four times daily as directed. (FOR ICD-9 250.00, 250.01). 07/05/19   Nicole Kindred A, DO  budesonide (PULMICORT) 0.25 MG/2ML nebulizer solution Take 2 mLs (0.25 mg total) by nebulization 2 (two) times daily. Patient taking differently: Take  0.5 mg by nebulization 2 (two) times daily.  12/31/17   Conforti, John, DO  cetirizine  (ZYRTEC) 10 MG tablet Place 10 mg into feeding tube daily.     [provider]  clonazePAM (KLONOPIN) 0.5 MG tablet Take 3 tablets (1.5 mg total) by mouth 3 (three) times daily. Patient taking differently: Take 0.5 mg by mouth 3 (three) times daily.  07/03/19   Ezekiel Slocumb, DO  Ensure Max Protein (ENSURE MAX PROTEIN) LIQD Take 330 mLs (11 oz total) by mouth 2 (two) times daily. 07/03/19   Ezekiel Slocumb, DO  famotidine (PEPCID) 20 MG tablet Place 1 tablet (20 mg total) into feeding tube daily. 12/31/17   Conforti, John, DO  fentaNYL (DURAGESIC) 25 MCG/HR Place 1 patch onto the skin every 3 (three) days.    [provider]  fluticasone (FLONASE) 50 MCG/ACT nasal spray Place 1 spray into both nostrils daily.     [provider]  guaiFENesin-dextromethorphan (ROBITUSSIN DM) 100-10 MG/5ML syrup Take 10 mLs by mouth every 6 (six) hours as needed for cough. 07/03/19   Nicole Kindred A, DO  insulin aspart (NOVOLOG) 100 UNIT/ML injection Inject 0-20 Units into the skin 3 (three) times daily with meals. 07/05/19 08/04/19  Nicole Kindred A, DO  insulin detemir (LEVEMIR) 100 UNIT/ML injection Inject 0.25 mLs (25 Units total) into the skin 2 (two) times daily. 07/05/19 08/04/19  Nicole Kindred A, DO  insulin lispro (HUMALOG) 100 UNIT/ML injection Inject 3 Units into the skin 3 (three) times daily with meals. Sliding scale 61-150 = 0 units 151-200= 3 units 201-250= 5 units 251-300 = 10 units  301-350 = 12 units 351-400= 14 units Call MD over 401 units    [provider]  Insulin Pen Needle (PEN NEEDLES) 32G X 6 MM MISC 1 each by Does not apply route 4 (four) times daily - after meals and at bedtime. 07/05/19   Nicole Kindred A, DO  ipratropium-albuterol (DUONEB) 0.5-2.5 (3) MG/3ML SOLN Take 3 mLs by nebulization every 6 (six) hours. Patient taking differently: Take 3 mLs by nebulization every 6 (six) hours as needed (for shortness of breath).  12/31/17   Conforti, John, DO   mirtazapine (REMERON SOL-TAB) 30 MG disintegrating tablet Take 1 tablet (30 mg total) by mouth at bedtime. 07/05/19   Ezekiel Slocumb, DO  oxyCODONE-acetaminophen (PERCOCET/ROXICET) 5-325 MG tablet Take 1 tablet by mouth every 8 (eight) hours as needed.    [provider]  PARoxetine (PAXIL) 20 MG tablet Place 1 tablet (20 mg total) into feeding tube at bedtime. Patient taking differently: Place 20 mg into feeding tube daily.  12/31/17   Conforti, John, DO  polyethylene glycol (MIRALAX / GLYCOLAX) 17 g packet Place 17 g into feeding tube daily.    [provider]  QUEtiapine (SEROQUEL) 100 MG tablet Place 1 tablet (100 mg total) into feeding tube at bedtime. 07/03/19   Ezekiel Slocumb, DO  senna (SENOKOT) 8.6 MG TABS tablet Place 1-2 tablets into feeding tube at bedtime.     [provider]  simethicone (MYLICON) 80 MG chewable tablet Chew 1 tablet (80 mg total) by mouth every 6 (six) hours as needed for flatulence. 07/05/19   Nicole Kindred A, DO  sodium chloride irrigation 0.9 % irrigation Irrigate with 50 mLs as directed every 6 (six) hours. 07/05/19   Ezekiel Slocumb, DO   Allergies  Allergen Reactions  . Amoxicillin     Patient has tolerated cephalosporins in the past  FAMILY HISTORY:  family history includes Asthma in her mother; Breast cancer (age of onset: 72) in her maternal aunt; CAD in her father; COPD in her sister; CVA in her father; Lung cancer in her mother; Stroke in her father. SOCIAL HISTORY:  reports that she quit smoking about 2 years ago. Her smoking use included cigarettes. She has a 4.10 pack-year smoking history. She has never used smokeless tobacco. She reports current drug use. Drug: "Crack" cocaine. She reports that she does not drink alcohol.   COVID-19 DISASTER DECLARATION:  FULL CONTACT PHYSICAL EXAMINATION WAS NOT POSSIBLE DUE TO TREATMENT OF COVID-19 AND  CONSERVATION OF PERSONAL PROTECTIVE EQUIPMENT, LIMITED EXAM FINDINGS  INCLUDE-  Patient assessed or the symptoms described in the history of present illness.  In the context of the Global COVID-19 pandemic, which necessitated consideration that the patient might be at risk for infection with the SARS-CoV-2 virus that causes COVID-19, Institutional protocols and algorithms that pertain to the evaluation of patients at risk for COVID-19 are in a state of rapid change based on information released by regulatory bodies including the CDC and federal and state organizations. These policies and algorithms were followed during the patient's care while in hospital.  REVIEW OF SYSTEMS:  Positives in BOLD Constitutional: Negative for +fever, chills, weight loss, malaise/fatigue and +diaphoresis.  HENT: Negative for hearing loss, ear pain, nosebleeds, congestion, sore throat, neck pain, tinnitus and ear discharge.   Eyes: Negative for blurred vision, double vision, photophobia, pain, discharge and redness.  Respiratory: Negative for +cough, hemoptysis, +sputum production, +shortness of breath, wheezing and stridor.   Cardiovascular: Negative for chest pain, palpitations, orthopnea, claudication, leg swelling and PND.  Gastrointestinal: Negative for heartburn, nausea, vomiting, +abdominal pain, +diarrhea, constipation, blood in stool and melena.  Genitourinary: Negative for dysuria, urgency, frequency, hematuria and flank pain.  Musculoskeletal: Negative for myalgias, back pain, joint pain and falls.  Skin: Negative for itching and rash.  Neurological: Negative for dizziness, tingling, tremors, sensory change, speech change, focal weakness, seizures, loss of consciousness, weakness and headaches.  Endo/Heme/Allergies: Negative for environmental allergies and polydipsia. Does not bruise/bleed easily.  SUBJECTIVE:  Patient reports shortness of breath, cough, sputum production (clear sputum), wheezing, intermittent diarrhea (last episode on Wednesday) She also reports abdominal  tenderness, fever/chills Denies chest pain, nausea, vomiting  VITAL SIGNS: Temp:  [100.6 F (38.1 C)] 100.6 F (38.1 C) (03/04 2240) Pulse Rate:  [126-140] 136 (03/05 0030) Resp:  [20-22] 21 (03/05 0030) BP: (227-234)/(117-126) 230/124 (03/05 0030) SpO2:  [96 %-98 %] 98 % (03/05 0044) FiO2 (%):  [36 %] 36 % (03/05 0015) Weight:  [88.9 kg] 88.9 kg (03/04 2241)  PHYSICAL EXAMINATION: General: Acute on chronically ill-appearing female, sitting in bed, trached and on ventilator, in moderate respiratory distress Neuro: Awake, alert and oriented x4, follows commands, no focal deficits, pupils PERRLA HEENT: Atraumatic, normocephalic, neck supple, no JVD Cardiovascular: Tachycardia, regular rhythm, S1-S2, no murmurs, rubs or gallops, 2+ pulses Lungs: Diminished to auscultation bilaterally, ventilator assisted, even, tachypnea, accessory muscle use Abdomen: Distended, taut, tender Musculoskeletal: Normal bulk and tone, no deformities, no edema Skin: Warm and diaphoretic, no obvious rashes, lesions, or ulcerations  Recent Labs  Lab 08/06/19 2249  NA 141  K 3.4*  CL 109  CO2 22  BUN 12  CREATININE 0.69  GLUCOSE 165*   Recent Labs  Lab 08/06/19 2249  HGB 14.1  HCT 46.6*  WBC 16.3*  PLT 270   DG Chest Port 1 View  Result Date: 08/06/2019  CLINICAL DATA:  Respiratory failure and fevers EXAM: PORTABLE CHEST 1 VIEW COMPARISON:  06/27/2019 FINDINGS: Tracheostomy tube is again seen in satisfactory position. Lungs are well aerated bilaterally. No focal infiltrate or sizable effusion is seen. No bony abnormality is noted. Cardiac shadow is within normal limits. No bony abnormality is noted. IMPRESSION: No active disease. Electronically Signed   By: Inez Catalina M.D.   On: 08/06/2019 23:12    ASSESSMENT / PLAN:  Acute Respiratory Failure secondary to suspected HCAP/VAP  Hx: COPD, chronic tracheostomy, Asthma -Supplemental O2 as needed to maintain O2 sats 88 to 94% -Full vent  support -Wean FiO2 & PEEP as tolerated -Follow intermittent CXR & ABG as needed -Continue Ceftazidime & Vancomycin -Consider CT chest  Sepsis secondary to suspected HCAP/VAP (initial chest x-ray without obvious infiltrate, however symptoms consistent with pneumonia along with leukocytosis) -Monitor fever curve -Trend WBC's & Procalcitonin -Follow cultures as above -Discussed with Dr. Gillermina Phy of [facility], will place on Ceftazidime & Vancomycin for now (given hx of Pseudomonas & Serratia both sensitive to Ceftazidime) -Urinalysis is pending  -Consider CT Chest  Hypertension, likely secondary to severe anxiety Mildly elevated Troponin, likely demand ischemia Hx: CHF, CAD -Continuous cardiac monitoring -Maintain MAP greater than 65 -As needed labetalol -Precedex to help with anxiety -Trend troponin until downtrending  Abdominal distention Hx: Hepatitis C -N.p.o. for now -Pepcid for stress ulcer prophylaxis -Alkaline phosphatase mildly elevated at 134, otherwise LFTs normal -Obtain KUB>> No acute abnormality noted  Hypokalemia -Monitor I&O's / urinary output -Follow BMP -Ensure adequate renal perfusion -Avoid nephrotoxic agents as able -Replace electrolytes as indicated  Anxiety Hx: Anxiety, Cocaine abuse -Provide supportive care -Precedex drip as needed -Placed back on home clonazepam, Remeron, Seroquel -Check urine drug screen     DISPOSITION: ICU GOALS OF CARE: DNR/DNI VTE PROPHYLAXIS: Subcu Lovenox SUP PROPHYLAXIS: IV Pepcid UPDATES: Updated patient and daughter at bedside 08/07/2019   Darel Hong, [facility] Pager: [phone]  08/07/2019, 12:46 AM

## 2019-08-07 NOTE — Progress Notes (Addendum)
Pharmacy Antibiotic Note  Michelle [name] is a 59 y.o. female admitted on 08/06/2019 with pneumonia.  Pharmacy has been consulted for Ceftazidime and Vancomycin dosing. She has a recent history of tracheal aspirates that grew out S marcescens and Pseudomonas sensitive to ceftazidime.  Plan: 1) continue ceftazidime 2gm IV q8hrs  2) adjust vancomycin dose to 1000 mg IV Q 24 hrs Goal AUC 400-550 Expected AUC: 562.9 SCr used: 1.0 T1/2: 16.2h Css (calculated): 37.5/14.0 mcg/mL  Height: 5' 0.98" (154.9 cm) Weight: 184 lb 4.9 oz (83.6 kg) IBW/kg (Calculated) : 47.76  Temp (24hrs), Avg:98.9 F (37.2 C), Min:97.8 F (36.6 C), Max:100.6 F (38.1 C)  Recent Labs  Lab 08/06/19 2249 08/07/19 0059 08/07/19 0520  WBC 16.3*  --  13.9*  CREATININE 0.69  --  1.00  LATICACIDVEN 1.4 1.0  --     Estimated Creatinine Clearance: 60.1 mL/min (by C-G formula based on SCr of 1 mg/dL).    Allergies  Allergen Reactions  . Amoxicillin     Patient has tolerated cephalosporins in the past    Antimicrobials this admission: Aztreonam 3/4 >> 3/5 Ceftazidime 3/5 >> Vancomycin 3/5 >>   Microbiology results: 3/4 BCx: 1/4 GPC 3/4 SARS CoV-2: negative 3/4 influenza A/B: negative    3/5 MRSA PCR: positive  Thank you for allowing pharmacy to be a part of this patient's care.  Dallie Piles 08/07/2019 8:27 AM

## 2019-08-07 NOTE — Progress Notes (Signed)
Initial Nutrition Assessment  DOCUMENTATION CODES:   Obesity unspecified  INTERVENTION:  Will monitor for diet advancement and adequacy of PO intake to determine if patient will require oral nutrition supplement/nutrition intervention on follow-up.  NUTRITION DIAGNOSIS:   Inadequate oral intake related to inability to eat as evidenced by NPO status.  GOAL:   Patient will meet greater than or equal to 90% of their needs  MONITOR:   Diet advancement, PO intake, Labs, Weight trends, I & O's  REASON FOR ASSESSMENT:   Ventilator    ASSESSMENT:   59 year old female with PMHx of COPD, emphysema, HTN, CAD, hx cocaine use, asthma, CHF, anxiety, hepatitis C, hx tracheostomy tube placement on 10/17/2017 on home vent, hx G-tube placement 10/31/2017 admitted with acute respiratory failure and sepsis secondary to suspected HCAP/VAP.   Patient was weaning off of Precedex at time of RD assessment and unable to provide any history. Discussed with RN and on rounds. Plan is for patient to remain NPO for now in setting of abdominal distention. Once patient is awake and appropriate for PO intake plan will be to advance diet as tolerated as patient eats while on ventilator at home. Will monitor for diet advancement.  Patient's weight continues to trend up. She was 49 kg in 2019, 58.3 kg on 10/22/2018, 74.4 kg on 12/08/2018, and is now 83.6 kg (184.3 lbs).  Medications reviewed and include: Novolog 0-15 units Q4hrs, Remeron 30 mg daily per tube, Miralax, ceftazidime, Precedex gtt, famotidine, LR at 50 mL/hr, vancomycin.  Labs reviewed: CBG 138-214.  NUTRITION - FOCUSED PHYSICAL EXAM:    Most Recent Value  Orbital Region  No depletion  Upper Arm Region  No depletion  Thoracic and Lumbar Region  No depletion  Buccal Region  No depletion  Temple Region  No depletion  Clavicle Bone Region  No depletion  Clavicle and Acromion Bone Region  No depletion  Scapular Bone Region  Unable to assess  Dorsal  Hand  No depletion  Patellar Region  No depletion  Anterior Thigh Region  No depletion  Posterior Calf Region  No depletion  Edema (RD Assessment)  Mild  Hair  Reviewed  Eyes  Unable to assess  Mouth  Unable to assess  Skin  Reviewed  Nails  Reviewed     Diet Order:   Diet Order            Diet NPO time specified  Diet effective now             EDUCATION NEEDS:   No education needs have been identified at this time  Skin:  Skin Assessment: Reviewed RN Assessment  Last BM:  Unknown  Height:   Ht Readings from Last 1 Encounters:  08/06/19 5' 0.98" (1.549 m)   Weight:   Wt Readings from Last 1 Encounters:  08/07/19 83.6 kg   Ideal Body Weight:  47.7 kg  BMI:  Body mass index is 34.84 kg/m.  Estimated Nutritional Needs:   Kcal:  1500-1700  Protein:  85-95 grams  Fluid:  1.5-1.7 L/day  Jacklynn Barnacle, MS, RD, LDN Pager number available on Amion

## 2019-08-07 NOTE — TOC Initial Note (Addendum)
Transition of Care [facility]) - Initial/Assessment Note    Patient Details  Name: Michelle [name] MRN: [id number] Date of Birth: 27-Aug-1960  Transition of Care [facility]) CM/SW Contact:    Magnus Ivan, LCSW Phone Number: 08/07/2019, 2:26 PM  Clinical Narrative:             CSW met with patient at bedside. Explained CSW role. Patient only able to answer yes/no due to trach and patient could not verbalize answers to questions. Patient was alert and oriented. Patient confirmed she is still living at home with her daughter [name], has Home Health RN services through [facility], and Outpatient Palliative Services through [facility]. Patient also confirmed that she has a hospital bed, hoyer lift, and Trilogy at home. CSW will continue to follow.     CSW also called patient's daughter/caregiver, [name]. [name] confirmed the above information. [name] reported they use Armed forces technical officer in [place] and denied issues with obtaining medications for patient. [name] provides transportation for patient. She reported in addition to the above listed equipment, patient also has a walker and motorized wheelchair. CSW inquired if [name] has a contact person for [facility] so that CSW can inform them patient is here and follow up with them as needed. [name] reported she will have to get that information when she gets home.    Expected Discharge Plan: [place] Barriers to Discharge: Continued Medical Work up   Patient Goals and CMS Choice        Expected Discharge Plan and Services Expected Discharge Plan: [place]       Living arrangements for the past 2 months: Single Family Home                                      Prior Living Arrangements/Services Living arrangements for the past 2 months: Single Family Home Lives with:: Adult Children Patient language and need for interpreter reviewed:: Yes Do you feel safe going back to the place where you live?: Yes      Need  for Family Participation in Patient Care: Yes (Comment) Care giver support system in place?: Yes (comment) Current home services: DME, Home RN Criminal Activity/Legal Involvement Pertinent to Current Situation/Hospitalization: No - Comment as needed  Activities of Daily Living      Permission Sought/Granted                  Emotional Assessment Appearance:: Appears stated age Attitude/Demeanor/Rapport: Engaged   Orientation: : Oriented to Self, Oriented to Place, Oriented to Situation Alcohol / Substance Use: Not Applicable Psych Involvement: No (comment)  Admission diagnosis:  Abdominal distension [R14.0] Sepsis ([place]) [A41.9] Patient Active Problem List   Diagnosis Date Noted  . Pseudomonas respiratory infection 07/03/2019  . Depression with anxiety 07/03/2019  . Hyperlipidemia associated with type 2 diabetes mellitus ([place]) 07/03/2019  . PEG (percutaneous endoscopic gastrostomy) status ([place]) 07/03/2019  . Chronic pain 07/03/2019  . Hypokalemia 07/03/2019  . New onset of type 2 diabetes mellitus in pediatric patient ([place]) 06/24/2019  . Chronic respiratory failure requiring continuous mechanical ventilation through tracheostomy ([place]) 10/20/2018  . CAD (coronary artery disease) 10/20/2018  . HTN (hypertension) 10/20/2018  . Chronic diastolic CHF (congestive heart failure) ([place]) 10/20/2018  . Pneumonia 09/17/2018  . Panic disorder 10/29/2017  . Altered mental status   . Pressure injury of skin 10/23/2017  . Acute on chronic respiratory failure  with hypoxia and hypercapnia ([place]) 06/29/2017  . Acute on chronic respiratory failure with hypoxia ([place]) 02/23/2017  . Protein-calorie malnutrition, severe 05/23/2016  . Sepsis ([place]) 05/22/2016  . Malnutrition of moderate degree ([place]) 10/25/2014  . COPD exacerbation ([place]) 10/24/2014  . Tobacco abuse 10/24/2014  . Community acquired pneumonia 10/24/2014   PCP:  Frazier Richards, MD Pharmacy:   [facility] [address] [place] [address] Phone: [phone] Fax: [phone]  [id number] [id number]: [phone]: [phone]    Social Determinants of Health (SDOH) Interventions    Readmission Risk Interventions Readmission Risk Prevention Plan 08/07/2019 09/19/2018  Transportation Screening - Complete  PCP or Specialist Appt within 5-7 Days - Complete  Home Care Screening - Complete  Medication Review (RN CM) - Complete  PCP or Specialist appointment within 3-5 days of discharge Complete -  [facility] Consult Complete -  Palliative Care Screening Complete -  Some recent data might be hidden

## 2019-08-07 NOTE — ED Notes (Signed)
PT complaining of being hot at this time. Pillow provided for comfort and lights dimmed. PT has call light in rach

## 2019-08-07 NOTE — Progress Notes (Signed)
PHARMACY - PHYSICIAN COMMUNICATION CRITICAL VALUE ALERT - BLOOD CULTURE IDENTIFICATION (BCID)  Results for orders placed or performed during the hospital encounter of 09/30/17  Blood Culture ID Panel (Reflexed) (Collected: 10/18/2017 10:51 AM)  Result Value Ref Range   Enterococcus species NOT DETECTED NOT DETECTED   Listeria monocytogenes NOT DETECTED NOT DETECTED   Staphylococcus species DETECTED (A) NOT DETECTED   Staphylococcus aureus (BCID) NOT DETECTED NOT DETECTED   Methicillin resistance DETECTED (A) NOT DETECTED   Streptococcus species NOT DETECTED NOT DETECTED   Streptococcus agalactiae NOT DETECTED NOT DETECTED   Streptococcus pneumoniae NOT DETECTED NOT DETECTED   Streptococcus pyogenes NOT DETECTED NOT DETECTED   Acinetobacter baumannii NOT DETECTED NOT DETECTED   Enterobacteriaceae species NOT DETECTED NOT DETECTED   Enterobacter cloacae complex NOT DETECTED NOT DETECTED   Escherichia coli NOT DETECTED NOT DETECTED   Klebsiella oxytoca NOT DETECTED NOT DETECTED   Klebsiella pneumoniae NOT DETECTED NOT DETECTED   Proteus species NOT DETECTED NOT DETECTED   Serratia marcescens NOT DETECTED NOT DETECTED   Haemophilus influenzae NOT DETECTED NOT DETECTED   Neisseria meningitidis NOT DETECTED NOT DETECTED   Pseudomonas aeruginosa NOT DETECTED NOT DETECTED   Candida albicans NOT DETECTED NOT DETECTED   Candida glabrata NOT DETECTED NOT DETECTED   Candida krusei NOT DETECTED NOT DETECTED   Candida parapsilosis NOT DETECTED NOT DETECTED   Candida tropicalis NOT DETECTED NOT DETECTED   Current Antibiotics: vancomycin and ceftazidime for PNA  Name of physician (or Provider) Contacted: Dr Corbin Ade  Changes to prescribed antibiotics required: no changes required  Michelle [name] 08/07/2019  2:02 PM

## 2019-08-07 NOTE — Progress Notes (Addendum)
Pharmacy Antibiotic Note  Michelle [name] is a 59 y.o. female admitted on 08/06/2019 with pneumonia.  Pharmacy has been consulted for Ceftazidime and Vancomycin dosing.  Pt received Aztreonam and Vancomycin in ED  Plan: Ceftazidime 2gm IV q8hrs  Vancomycin 1250 mg IV Q 24 hrs. Goal AUC 400-550.  Expected AUC: 536.6, estimated trough level 12.1 SCr used: 0.8   Height: 5' 0.98" (154.9 cm) Weight: 196 lb (88.9 kg) IBW/kg (Calculated) : 47.76  Temp (24hrs), Avg:100.6 F (38.1 C), Min:100.6 F (38.1 C), Max:100.6 F (38.1 C)  Recent Labs  Lab 08/06/19 2249 08/07/19 0059  WBC 16.3*  --   CREATININE 0.69  --   LATICACIDVEN 1.4 1.0    Estimated Creatinine Clearance: 77.7 mL/min (by C-G formula based on SCr of 0.69 mg/dL).    Allergies  Allergen Reactions  . Amoxicillin     Patient has tolerated cephalosporins in the past    Antimicrobials this admission: Aztreonam 3/4 >> 3/5 Ceftazidime 3/5 >> Vancomycin 3/5 >>   Dose adjustments this admission:   Microbiology results:  BCx:   UCx:    Sputum:    MRSA PCR:   Thank you for allowing pharmacy to be a part of this patient's care.  Hart Robinsons A 08/07/2019 2:10 AM

## 2019-08-07 NOTE — Progress Notes (Signed)
eLink Physician-Brief Progress Note Patient Name: Michelle [name] DOB: 19-Aug-1960 MRN: [id number]   Date of Service  08/07/2019  HPI/Events of Note  New patient evaluation.  Briefly, this is a 52F with CHF and COPD s/p tracheostomy on home ventilator, among other comorbidities, who is admitted for dyspnea and cough and sputum production and fever.  COVID/Flu PCR was negative in the ED.  She was given aztreonam (penicillin allergy noted, though the note also says that she has tolerated cephalosporins in the past), vancomycin and flagyl and admitted to the ICU for further management.   eICU Interventions  # Neuro: - Precedex drip as needed.  # Respiratory: - Full vent support: VC 450x20, 5, 36% - Treat empirically for VAP as  Below.  # ID: - Patient has grown Pseudomonas aerogenes resistant to cipro but sensitive to Zosyn/ceftazidime (aztreonam sensitivities not performed) in the past (see 06/27/2019 culture).  - They have also grown Serratia (sensitive to ceftazidime) in the past (same culture). - In 11/2017 they grew Pseudomonas that was aztreonam resistant. - Continue with vancomycin IV, but switch from aztreonam to ceftazidime given prior tolerance of cephalosporins and lack of susceptibility data to this agent from 06/2019 culture and prior resistance to aztreonam when PsA has been isolated from her in the past. - Send tracheal aspirate for culture.  DVT PPX: Lovenox Elrama (PPX dosing).     Intervention Category Evaluation Type: New Patient Evaluation  [name] 08/07/2019, 2:33 AM

## 2019-08-08 ENCOUNTER — Encounter: Payer: Self-pay | Admitting: Internal Medicine

## 2019-08-08 LAB — RENAL FUNCTION PANEL
Albumin: 3.2 g/dL — ABNORMAL LOW (ref 3.5–5.0)
Anion gap: 9 (ref 5–15)
BUN: 14 mg/dL (ref 6–20)
CO2: 24 mmol/L (ref 22–32)
Calcium: 8.1 mg/dL — ABNORMAL LOW (ref 8.9–10.3)
Chloride: 113 mmol/L — ABNORMAL HIGH (ref 98–111)
Creatinine, Ser: 1.12 mg/dL — ABNORMAL HIGH (ref 0.44–1.00)
GFR calc Af Amer: >60 mL/min (ref >60)
GFR calc non Af Amer: 54 mL/min — ABNORMAL LOW (ref >60)
Glucose, Bld: 132 mg/dL — ABNORMAL HIGH (ref 70–99)
Phosphorus: 3.2 mg/dL (ref 2.5–4.6)
Potassium: 4 mmol/L (ref 3.5–5.1)
Sodium: 146 mmol/L — ABNORMAL HIGH (ref 135–145)

## 2019-08-08 LAB — CBC
HCT: 36.2 % (ref 36.0–46.0)
Hemoglobin: 10.8 g/dL — ABNORMAL LOW (ref 12.0–15.0)
MCH: 26.1 pg (ref 26.0–34.0)
MCHC: 29.8 g/dL — ABNORMAL LOW (ref 30.0–36.0)
MCV: 87.4 fL (ref 80.0–100.0)
Platelets: 198 10*3/uL (ref 150–400)
RBC: 4.14 MIL/uL (ref 3.87–5.11)
RDW: 15.4 % (ref 11.5–15.5)
WBC: 7.3 10*3/uL (ref 4.0–10.5)
nRBC: 0 % (ref 0.0–0.2)

## 2019-08-08 LAB — URINE CULTURE: Culture: NO GROWTH

## 2019-08-08 LAB — GLUCOSE, CAPILLARY
Glucose-Capillary: 130 mg/dL — ABNORMAL HIGH (ref 70–99)
Glucose-Capillary: 117 mg/dL — ABNORMAL HIGH (ref 70–99)
Glucose-Capillary: 144 mg/dL — ABNORMAL HIGH (ref 70–99)
Glucose-Capillary: 101 mg/dL — ABNORMAL HIGH (ref 70–99)
Glucose-Capillary: 122 mg/dL — ABNORMAL HIGH (ref 70–99)

## 2019-08-08 LAB — PROCALCITONIN: Procalcitonin: 0.12 ng/mL

## 2019-08-08 LAB — MAGNESIUM: Magnesium: 1.9 mg/dL (ref 1.7–2.4)

## 2019-08-08 MED ORDER — LACTATED RINGERS IV BOLUS: 1000.0000 mL | Freq: Once | INTRAVENOUS | Status: DC

## 2019-08-08 MED ORDER — ORAL CARE MOUTH RINSE
15.0000 mL | OROMUCOSAL | Status: DC
  Administered 2019-08-09 – 2019-08-12 (×27): 15 mL via OROMUCOSAL

## 2019-08-08 MED ORDER — CHLORHEXIDINE GLUCONATE 0.12% ORAL RINSE (MEDLINE KIT)
15.0000 mL | Freq: Two times a day (BID) | OROMUCOSAL | Status: DC
  Administered 2019-08-09 – 2019-08-12 (×5): 15 mL via OROMUCOSAL

## 2019-08-08 MED ORDER — NYSTATIN 100000 UNIT/GM EX CREA
TOPICAL_CREAM | Freq: Two times a day (BID) | CUTANEOUS | Status: DC
  Filled 2019-08-08: qty 15

## 2019-08-08 MED ORDER — VASOPRESSIN 20 UNIT/ML IV SOLN: 0.0300 [IU]/min | INTRAVENOUS | Status: DC

## 2019-08-08 MED ORDER — CLONAZEPAM 1 MG PO TABS
1.0000 mg | ORAL_TABLET | Freq: Three times a day (TID) | ORAL | Status: DC
  Administered 2019-08-08 – 2019-08-11 (×10): 1 mg via ORAL
  Filled 2019-08-08 (×10): qty 1

## 2019-08-08 NOTE — Progress Notes (Signed)
Pharmacy Antibiotic Note  Michelle [name] is a 59 y.o. female admitted on 08/06/2019 with pneumonia.  Pharmacy has been consulted for Ceftazidime and Vancomycin dosing. She has a recent history of tracheal aspirates that grew out S marcescens and Pseudomonas sensitive to ceftazidime.  Plan: 1) continue ceftazidime 2gm IV q8hrs  2) continue vancomycin dose to 1000 mg IV Q 24 hrs Goal AUC 400-550 Expected AUC: 426 SCr used: 1.12 Css (calculated): 27/11 mcg/mL  Height: 5' 0.98" (154.9 cm) Weight: 185 lb 10 oz (84.2 kg) IBW/kg (Calculated) : 47.76  Temp (24hrs), Avg:98.3 F (36.8 C), Min:98 F (36.7 C), Max:98.6 F (37 C)  Recent Labs  Lab 08/06/19 2249 08/07/19 0059 08/07/19 0520 08/08/19 0540  WBC 16.3*  --  13.9*  --   CREATININE 0.69  --  1.00 1.12*  LATICACIDVEN 1.4 1.0  --   --     Estimated Creatinine Clearance: 53.9 mL/min (A) (by C-G formula based on SCr of 1.12 mg/dL (H)).    Allergies  Allergen Reactions  . Amoxicillin     Patient has tolerated cephalosporins in the past    Antimicrobials this admission: Aztreonam 3/4 >> 3/5 Ceftazidime 3/5 >> Vancomycin 3/5 >>   Microbiology results: 3/4 BCx: 1/4 GPC 3/4 SARS CoV-2: negative 3/4 influenza A/B: negative    3/5 MRSA PCR: positive  Thank you for allowing pharmacy to be a part of this patient's care.  Oswald Hillock 08/08/2019 7:55 AM

## 2019-08-08 NOTE — Progress Notes (Signed)
CRITICAL CARE NOTE  BRIEF PATIENT DESCRIPTION:  59 year old female, DNR/DNI, with past medical history notable for severe COPD requiring chronic trach who is admitted 08/07/19 for acute respiratory failure and sepsis secondary to suspected HCAP/VAP.   SIGNIFICANT EVENTS  3/5- Admission to ICU 3/6 severe pneumonia-previous h/o multidrug resistant pseudomonas  STUDIES:  3/4- CXR: Tracheostomy tube is again seen in satisfactory position. Lungs are well aerated bilaterally. No focal infiltrate or sizable effusion is seen. No bony abnormality is noted. Cardiac shadow is within normal limits. No bony abnormality is noted. 3/5: KUB: No acute abnormality noted.  CULTURES: SARS-CoV-2 PCR 3/4>> negative Influenza PCR 3/4>> negative Blood culture x2 3/4>> Tracheal aspirate 3/5>> Strep pneumo urinary antigen 3/5>> Legionella urinary antigen 3/5>>  ANTIBIOTICS: Aztreonam 3/4 x dose Flagyl 3/4 x 1 dose Ceftazidime 3/5>> Vancomycin 3/4>>   CC  follow up respiratory failure  SUBJECTIVE Patient remains critically ill Prognosis is guarded On vent S/p trach   BP (!) 86/58   Pulse 68   Temp 98 F (36.7 C) (Oral)   Resp 20   Ht 5' 0.98" (1.549 m)   Wt 84.2 kg   LMP 03/06/2005 (Approximate)   SpO2 99%   BMI 35.09 kg/m    I/O last 3 completed shifts: In: 4868.8 [P.O.:300; I.V.:1059.2; IV Piggyback:3509.7] Out: 3100 [Urine:3100] No intake/output data recorded.  SpO2: 99 % FiO2 (%): 36 %    REVIEW OF SYSTEMS  LIMITEDL DUE TO LETHARGY  PHYSICAL EXAMINATION:  GENERAL:critically ill appearing, +resp distress HEAD: Normocephalic, atraumatic.  EYES: Pupils equal, round, reactive to light.  No scleral icterus.  MOUTH: Moist mucosal membrane. NECK: Supple.  PULMONARY: +rhonchi,  CARDIOVASCULAR: S1 and S2. Regular rate and rhythm. No murmurs, rubs, or gallops.  GASTROINTESTINAL: Soft, nontender, -distended.  Positive bowel sounds.   MUSCULOSKELETAL: No swelling,  clubbing, or edema.  NEUROLOGIC: lethargic SKIN:intact,warm,dry  MEDICATIONS: I have reviewed all medications and confirmed regimen as documented   CULTURE RESULTS   Recent Results (from the past 240 hour(s))  Blood Culture (routine x 2)     Status: None (Preliminary result)   Collection Time: 08/06/19 10:49 PM   Specimen: BLOOD  Result Value Ref Range Status   Specimen Description BLOOD LEFT WRIST  Final   Special Requests   Final    BOTTLES DRAWN AEROBIC AND ANAEROBIC Blood Culture adequate volume   Culture  Setup Time   Final    GRAM POSITIVE COCCI ANAEROBIC BOTTLE ONLY CRITICAL RESULT CALLED TO, READ BACK BY AND VERIFIED WITH: KRISTIN MERRILL AT 1330 08/07/19.PMF Performed at [facility], [address] [place], [place] [address]    Culture [facility] POSITIVE COCCI  Final   Report Status PENDING  Incomplete  Respiratory Panel by RT PCR (Flu A&B, Covid) - Nasopharyngeal Swab     Status: None   Collection Time: 08/06/19 10:55 PM   Specimen: Nasopharyngeal Swab  Result Value Ref Range Status   SARS Coronavirus 2 by RT PCR NEGATIVE NEGATIVE Final    Comment: (NOTE) SARS-CoV-2 target nucleic acids are NOT DETECTED. The SARS-CoV-2 RNA is generally detectable in upper respiratoy specimens during the acute phase of infection. The lowest concentration of SARS-CoV-2 viral copies this assay can detect is 131 copies/mL. A negative result does not preclude SARS-Cov-2 infection and should not be used as the sole basis for treatment or other patient management decisions. A negative result may occur with  improper specimen collection/handling, submission of specimen other than nasopharyngeal swab, presence of viral mutation(s) within the  areas targeted by this assay, and inadequate number of viral copies (<131 copies/mL). A negative result must be combined with clinical observations, patient history, and epidemiological information. The expected result is Negative. Fact Sheet for  Patients:  PinkCheek.be Fact Sheet for Healthcare Providers:  GravelBags.it This test is not yet ap proved or cleared by the [place] FDA and  has been authorized for detection and/or diagnosis of SARS-CoV-2 by FDA under an Emergency Use Authorization (EUA). This EUA will remain  in effect (meaning this test can be used) for the duration of the COVID-19 declaration under Section 564(b)(1) of the Act, 21 U.S.C. section 360bbb-3(b)(1), unless the authorization is terminated or revoked sooner.    Influenza A by PCR NEGATIVE NEGATIVE Final   Influenza B by PCR NEGATIVE NEGATIVE Final    Comment: (NOTE) The Xpert Xpress SARS-CoV-2/FLU/RSV assay is intended as an aid in  the diagnosis of influenza from Nasopharyngeal swab specimens and  should not be used as a sole basis for treatment. Nasal washings and  aspirates are unacceptable for Xpert Xpress SARS-CoV-2/FLU/RSV  testing. Fact Sheet for Patients: PinkCheek.be Fact Sheet for Healthcare Providers: GravelBags.it This test is not yet approved or cleared by the [place] FDA and  has been authorized for detection and/or diagnosis of SARS-CoV-2 by  FDA under an Emergency Use Authorization (EUA). This EUA will remain  in effect (meaning this test can be used) for the duration of the  Covid-19 declaration under Section 564(b)(1) of the Act, 21  U.S.C. section 360bbb-3(b)(1), unless the authorization is  terminated or revoked. Performed at [facility], [address] [place], [place] [address]   Blood Culture (routine x 2)     Status: None (Preliminary result)   Collection Time: 08/06/19 11:16 PM   Specimen: BLOOD  Result Value Ref Range Status   Specimen Description BLOOD RIGHT FOREARM  Final   Special Requests   Final    BOTTLES DRAWN AEROBIC AND ANAEROBIC Blood Culture results may not be optimal due  to an inadequate volume of blood received in culture bottles   Culture   Final    NO GROWTH 2 DAYS Performed at [facility], [address] [place], [place] [address]    Report Status PENDING  Incomplete  MRSA PCR Screening     Status: Abnormal   Collection Time: 08/07/19  1:49 AM   Specimen: Nasopharyngeal  Result Value Ref Range Status   MRSA by PCR POSITIVE (A) NEGATIVE Final    Comment:        The GeneXpert MRSA Assay (FDA approved for NASAL specimens only), is one component of a comprehensive MRSA colonization surveillance program. It is not intended to diagnose MRSA infection nor to guide or monitor treatment for MRSA infections. RESULT CALLED TO, READ BACK BY AND VERIFIED WITH: Gypsy Lore [phone] 08/07/19 HNM Performed at [facility], [address] [place],  [address]           Ventilator continued, requirement due to severe respiratory failure   Ventilator Sedation RASS 0 to -2      ASSESSMENT AND PLAN SYNOPSIS   Severe ACUTE Hypoxic and Hypercapnic Respiratory Failure From HCAP/VAP(from home) and aspiration pneumonia -continue Full MV support -continue Bronchodilator Therapy -Wean Fio2 and PEEP as tolerated -will perform SAT/SBT when respiratory parameters are met  SEVERE COPD EXACERBATION -continue IV steroids as prescribed -continue NEB THERAPY as prescribed   NEUROLOGY PRN meds for agitation and delerium  SHOCK-SEPSIS/HYPOVOLUMIC/CARDIOGENIC -aggressive IV fluid  resuscitation  CARDIAC ICU monitoring  ID -continue IV abx as prescibed -follow up cultures  GI GI PROPHYLAXIS as indicated  NUTRITIONAL STATUS DIET-->TF's as tolerated Constipation protocol as indicated  ENDO - will use ICU hypoglycemic\Hyperglycemia protocol if indicated   ELECTROLYTES -follow labs as needed -replace as needed -pharmacy consultation and following   DVT/GI PRX ordered TRANSFUSIONS AS NEEDED MONITOR FSBS ASSESS the  need for LABS as needed   Critical Care Time devoted to patient care services described in this note is 35 minutes.   Overall, patient is critically ill, prognosis is guarded.    Corrin Parker, M.D.  [facility]  Medical Director [place] Director [facility] Cardio-Pulmonary Department

## 2019-08-09 LAB — GLUCOSE, CAPILLARY
Glucose-Capillary: 101 mg/dL — ABNORMAL HIGH (ref 70–99)
Glucose-Capillary: 104 mg/dL — ABNORMAL HIGH (ref 70–99)
Glucose-Capillary: 106 mg/dL — ABNORMAL HIGH (ref 70–99)
Glucose-Capillary: 108 mg/dL — ABNORMAL HIGH (ref 70–99)
Glucose-Capillary: 139 mg/dL — ABNORMAL HIGH (ref 70–99)
Glucose-Capillary: 155 mg/dL — ABNORMAL HIGH (ref 70–99)
Glucose-Capillary: 73 mg/dL (ref 70–99)
Glucose-Capillary: 74 mg/dL (ref 70–99)

## 2019-08-09 LAB — CULTURE, BLOOD (ROUTINE X 2): Special Requests: ADEQUATE

## 2019-08-09 LAB — PROCALCITONIN: Procalcitonin: <0.1 ng/mL

## 2019-08-09 LAB — STREP PNEUMONIAE URINARY ANTIGEN: Strep Pneumo Urinary Antigen: NEGATIVE

## 2019-08-09 MED ORDER — IPRATROPIUM-ALBUTEROL 0.5-2.5 (3) MG/3ML IN SOLN
3.0000 mL | RESPIRATORY_TRACT | Status: DC
  Administered 2019-08-09 – 2019-08-12 (×21): 3 mL via RESPIRATORY_TRACT
  Filled 2019-08-09 (×21): qty 3

## 2019-08-09 MED ORDER — METHYLPREDNISOLONE SODIUM SUCC 40 MG IJ SOLR
40.0000 mg | Freq: Every day | INTRAMUSCULAR | Status: DC
  Administered 2019-08-09 – 2019-08-10 (×2): 40 mg via INTRAVENOUS
  Filled 2019-08-09 (×2): qty 1

## 2019-08-09 MED ORDER — BUDESONIDE 0.5 MG/2ML IN SUSP
0.5000 mg | Freq: Two times a day (BID) | RESPIRATORY_TRACT | Status: DC
  Administered 2019-08-09 – 2019-08-12 (×7): 0.5 mg via RESPIRATORY_TRACT
  Filled 2019-08-09 (×7): qty 2

## 2019-08-09 NOTE — TOC Progression Note (Signed)
Transition of Care [facility]) - Progression Note    Patient Details  Name: Michelle [name] MRN: [id number] Date of Birth: 1960-07-19  Transition of Care [facility]) CM/SW [place], [place] Phone Number: [phone] 08/09/2019, 10:10 AM  Clinical Narrative:     Spoke with patient's daughter [name], she would like patient to return home instead of going to a SNF, since they have equipment and services already in place to care for her at home. Patient has [place] services through [place], and Outpatient Palliative Services through [facility] [employer]. Patient confirmed that she has a hospital bed, hoyer lift, and Trilogy at home. Patient receives her medications from [facility] in [place] and denied issues with obtaining medications for patient. Patient's daughter [name] provides transportation for patient. Patient also has a walker and motorized wheelchair.  Expected Discharge Plan: [place] Barriers to Discharge: Continued Medical Work up  Expected Discharge Plan and Services Expected Discharge Plan: [place] arrangements for the past 2 months: Single Family Home                                       Social Determinants of Health (SDOH) Interventions    Readmission Risk Interventions Readmission Risk Prevention Plan 08/07/2019 09/19/2018  Transportation Screening - Complete  PCP or Specialist Appt within 5-7 Days - Complete  Home Care Screening - Complete  Medication Review (RN CM) - Complete  PCP or Specialist appointment within 3-5 days of discharge Complete -  [place] or Home Care Consult Complete -  Palliative Care Screening Complete -  Some recent data might be hidden

## 2019-08-09 NOTE — TOC Progression Note (Addendum)
Transition of Care [facility]) - Progression Note    Patient Details  Name: Michelle [name] MRN: [id number] Date of Birth: 06-25-1960  Transition of Care [facility]) CM/SW [place], [place] Phone Number: [phone] 08/09/2019, 9:10 AM  Clinical Narrative:    Patient has [place] services through [place], and Outpatient Palliative Services through [place]. Patient confirmed that she has a hospital bed, hoyer lift, and Trilogy at home.   Patient's daughter/caregiver [name], confirmed the above information. Patient receives her medications from [facility] in [place] and denied issues with obtaining medications for patient. Patient's daughter [name] provides transportation for patient. Patient also has a walker and motorized wheelchair.   Waiting for patient's daughter [name] to contact TOCSW with name and number of contact person in [place]. This SW left a Advertising account executive for [name], waiting for a call back.   Expected Discharge Plan: [place] Barriers to Discharge: Continued Medical Work up  Expected Discharge Plan and Services Expected Discharge Plan: [place] arrangements for the past 2 months: Single Family Home                                       Social Determinants of Health (SDOH) Interventions    Readmission Risk Interventions Readmission Risk Prevention Plan 08/07/2019 09/19/2018  Transportation Screening - Complete  PCP or Specialist Appt within 5-7 Days - Complete  Home Care Screening - Complete  Medication Review (RN CM) - Complete  PCP or Specialist appointment within 3-5 days of discharge Complete -  [place] or Home Care Consult Complete -  Palliative Care Screening Complete -  Some recent data might be hidden

## 2019-08-09 NOTE — Progress Notes (Signed)
CRITICAL CARE NOTE  59 year old female, DNR/DNI, with past medical history notable for severe COPD requiring chronic trach who is admitted 08/07/19 for acute respiratory failure and sepsis secondary to suspected HCAP/VAP.   SIGNIFICANT EVENTS  3/5- Admission to ICU 3/6 severe pneumonia-previous h/o multidrug resistant pseudomonas 3/7 severe pneumonia  STUDIES:  3/4- CXR: Tracheostomy tube is again seen in satisfactory position. Lungs are well aerated bilaterally. No focal infiltrate or sizable effusion is seen. No bony abnormality is noted. Cardiac shadow is within normal limits. No bony abnormality is noted. 3/5: KUB: No acute abnormality noted.   CULTURES: SARS-CoV-2 PCR 3/4>> negative Influenza PCR 3/4>> negative Blood culture x2 3/4>> Tracheal aspirate 3/5>> Strep pneumo urinary antigen 3/5>> Legionella urinary antigen 3/5>>  ANTIBIOTICS: Aztreonam 3/4 x dose Flagyl 3/4 x 1 dose Ceftazidime 3/5>> Vancomycin 3/4>>    CC  follow up respiratory failure  SUBJECTIVE Patient remains critically ill Prognosis is guarded   BP (!) 98/56 (BP Location: Left Arm)   Pulse 66   Temp 98.2 F (36.8 C) (Oral)   Resp 20   Ht 5' 0.98" (1.549 m)   Wt 84.6 kg   LMP 03/06/2005 (Approximate)   SpO2 99%   BMI 35.26 kg/m    I/O last 3 completed shifts: In: 2371.5 [P.O.:240; I.V.:1150; IV Piggyback:981.5] Out: [id number] [Urine:4050] No intake/output data recorded.  SpO2: 99 % FiO2 (%): 36 %    REVIEW OF SYSTEMS LIMITED DUE TO RESP FAILURE  PATIENT IS UNABLE TO PROVIDE COMPLETE REVIEW OF SYSTEMS DUE TO SEVERE CRITICAL ILLNESS   PHYSICAL EXAMINATION:  GENERAL:critically ill appearing, +resp distress HEAD: Normocephalic, atraumatic.  EYES: Pupils equal, round, reactive to light.  No scleral icterus.  MOUTH: Moist mucosal membrane. NECK: Supple.  PULMONARY: +rhonchi,  CARDIOVASCULAR: S1 and S2. Regular rate and rhythm. No murmurs, rubs, or gallops.  GASTROINTESTINAL:  Soft, nontender, -distended.  Positive bowel sounds.   MUSCULOSKELETAL: No swelling, clubbing, or edema.  NEUROLOGIC: lethargic but arousbale SKIN:intact,warm,dry  MEDICATIONS: I have reviewed all medications and confirmed regimen as documented   CULTURE RESULTS   Recent Results (from the past 240 hour(s))  Blood Culture (routine x 2)     Status: Abnormal (Preliminary result)   Collection Time: 08/06/19 10:49 PM   Specimen: BLOOD  Result Value Ref Range Status   Specimen Description   Final    BLOOD LEFT WRIST Performed at [facility], [address], [place], [place] [address]    Special Requests   Final    BOTTLES DRAWN AEROBIC AND ANAEROBIC Blood Culture adequate volume Performed at [name], [address] [place], [place] [address]    Culture  Setup Time   Final    GRAM POSITIVE COCCI ANAEROBIC BOTTLE ONLY CRITICAL RESULT CALLED TO, READ BACK BY AND VERIFIED WITH: KRISTIN MERRILL AT 1330 08/07/19.PMF Performed at [facility], [place], [place] [address]    Culture STAPHYLOCOCCUS SPECIES (COAGULASE NEGATIVE) (A)  Final   Report Status PENDING  Incomplete  Respiratory Panel by RT PCR (Flu A&B, Covid) - Nasopharyngeal Swab     Status: None   Collection Time: 08/06/19 10:55 PM   Specimen: Nasopharyngeal Swab  Result Value Ref Range Status   SARS Coronavirus 2 by RT PCR NEGATIVE NEGATIVE Final    Comment: (NOTE) SARS-CoV-2 target nucleic acids are NOT DETECTED. The SARS-CoV-2 RNA is generally detectable in upper respiratoy specimens during the acute phase of infection. The lowest concentration of SARS-CoV-2 viral copies this assay can  detect is 131 copies/mL. A negative result does not preclude SARS-Cov-2 infection and should not be used as the sole basis for treatment or other patient management decisions. A negative result may occur with  improper specimen collection/handling, submission of specimen other than  nasopharyngeal swab, presence of viral mutation(s) within the areas targeted by this assay, and inadequate number of viral copies (<131 copies/mL). A negative result must be combined with clinical observations, patient history, and epidemiological information. The expected result is Negative. Fact Sheet for Patients:  PinkCheek.be Fact Sheet for Healthcare Providers:  GravelBags.it This test is not yet ap proved or cleared by the [place] FDA and  has been authorized for detection and/or diagnosis of SARS-CoV-2 by FDA under an Emergency Use Authorization (EUA). This EUA will remain  in effect (meaning this test can be used) for the duration of the COVID-19 declaration under Section 564(b)(1) of the Act, 21 U.S.C. section 360bbb-3(b)(1), unless the authorization is terminated or revoked sooner.    Influenza A by PCR NEGATIVE NEGATIVE Final   Influenza B by PCR NEGATIVE NEGATIVE Final    Comment: (NOTE) The Xpert Xpress SARS-CoV-2/FLU/RSV assay is intended as an aid in  the diagnosis of influenza from Nasopharyngeal swab specimens and  should not be used as a sole basis for treatment. Nasal washings and  aspirates are unacceptable for Xpert Xpress SARS-CoV-2/FLU/RSV  testing. Fact Sheet for Patients: PinkCheek.be Fact Sheet for Healthcare Providers: GravelBags.it This test is not yet approved or cleared by the [place] FDA and  has been authorized for detection and/or diagnosis of SARS-CoV-2 by  FDA under an Emergency Use Authorization (EUA). This EUA will remain  in effect (meaning this test can be used) for the duration of the  Covid-19 declaration under Section 564(b)(1) of the Act, 21  U.S.C. section 360bbb-3(b)(1), unless the authorization is  terminated or revoked. Performed at [facility], [address] [place], [place] [address]   Blood  Culture (routine x 2)     Status: None (Preliminary result)   Collection Time: 08/06/19 11:16 PM   Specimen: BLOOD  Result Value Ref Range Status   Specimen Description BLOOD RIGHT FOREARM  Final   Special Requests   Final    BOTTLES DRAWN AEROBIC AND ANAEROBIC Blood Culture results may not be optimal due to an inadequate volume of blood received in culture bottles   Culture   Final    NO GROWTH 3 DAYS Performed at [facility], [address] [place], [place] [address]    Report Status PENDING  Incomplete  MRSA PCR Screening     Status: Abnormal   Collection Time: 08/07/19  1:49 AM   Specimen: Nasopharyngeal  Result Value Ref Range Status   MRSA by PCR POSITIVE (A) NEGATIVE Final    Comment:        The GeneXpert MRSA Assay (FDA approved for NASAL specimens only), is one component of a comprehensive MRSA colonization surveillance program. It is not intended to diagnose MRSA infection nor to guide or monitor treatment for MRSA infections. RESULT CALLED TO, READ BACK BY AND VERIFIED WITHGypsy Lore [id number] 08/07/19 HNM Performed at [facility], [address] [place], [place] [address]   Urine culture     Status: None   Collection Time: 08/07/19  1:34 PM   Specimen: In/Out Cath Urine  Result Value Ref Range Status   Specimen Description   Final    IN/OUT CATH URINE Performed at [facility],  [place], [place] [address]    Special Requests   Final    NONE Performed at [facility], [address], [place], [place] [address]    Culture   Final    NO GROWTH Performed at [facility], [place] [address], [place], [place] [address]    Report Status 08/08/2019 FINAL  Final        Ventilator continued, requirement due to severe respiratory failure   Ventilator Sedation RASS 0 to -2      ASSESSMENT AND PLAN SYNOPSIS   Severe ACUTE Hypoxic and Hypercapnic Respiratory Failure HCAP/VAP from home with  aspiration pneumonia -continue Full MV support -continue Bronchodilator Therapy -Wean Fio2 and PEEP as tolerated  SEVERE COPD EXACERBATION -continue NEB THERAPY as prescribed   NEUROLOGY Severe agitation precedex as needed   SHOCK-SEPSIS/HYPOVOLUMIC/CARDIOGENIC-resolved -use vasopressors to keep MAP>65 if needed  CARDIAC ICU monitoring  ID -continue IV abx as prescibed -follow up cultures  GI GI PROPHYLAXIS as indicated  NUTRITIONAL STATUS DIET-->diet as tolerated Constipation protocol as indicated  ENDO - will use ICU hypoglycemic\Hyperglycemia protocol if indicated   ELECTROLYTES -follow labs as needed -replace as needed -pharmacy consultation and following   DVT/GI PRX ordered TRANSFUSIONS AS NEEDED MONITOR FSBS ASSESS the need for LABS as needed   Critical Care Time devoted to patient care services described in this note is 32 minutes.   Overall, patient is critically ill, prognosis is guarded.  Patient is DNR  Corrin Parker, M.D.  [facility]  Medical Director [place] Director [facility] Cardio-Pulmonary Department

## 2019-08-09 NOTE — Progress Notes (Signed)
Pharmacy Antibiotic Note  Michelle [name] is a 59 y.o. female admitted on 08/06/2019 with pneumonia.  Pharmacy has been consulted for Ceftazidime and Vancomycin dosing. She has a recent history of tracheal aspirates that grew out S marcescens and Pseudomonas sensitive to ceftazidime.  Plan: 1) continue ceftazidime 2gm IV q8hrs  2)  Continue vancomycin dose 1000 mg IV Q 24 hrs. Scr bumped yesterday. F/u with Scr in the AM. Potentially d/c vancomycin? MRSA PCR is positive but does not have a good positive predictive value. Bcx grew CoNS (1 out 4 bottle). If continuing vancomycin - recommend obtaining levels tomorrow.   Height: 5' 0.98" (154.9 cm) Weight: 186 lb 8.2 oz (84.6 kg) IBW/kg (Calculated) : 47.76  Temp (24hrs), Avg:98.4 F (36.9 C), Min:98 F (36.7 C), Max:98.8 F (37.1 C)  Recent Labs  Lab 08/06/19 2249 08/07/19 0059 08/07/19 0520 08/08/19 0540 08/08/19 0747  WBC 16.3*  --  13.9*  --  7.3  CREATININE 0.69  --  1.00 1.12*  --   LATICACIDVEN 1.4 1.0  --   --   --     Estimated Creatinine Clearance: 54 mL/min (A) (by C-G formula based on SCr of 1.12 mg/dL (H)).    Allergies  Allergen Reactions  . Amoxicillin     Patient has tolerated cephalosporins in the past    Antimicrobials this admission: Aztreonam 3/4 >> 3/5 Ceftazidime 3/5 >> Vancomycin 3/5 >>   Microbiology results: 3/4 BCx: 1/4 GPC 3/4 SARS CoV-2: negative 3/4 influenza A/B: negative    3/5 MRSA PCR: positive  Thank you for allowing pharmacy to be a part of this patient's care.  Oswald Hillock, PharmD, BCPS 08/09/2019 1:10 PM

## 2019-08-10 DIAGNOSIS — J441 Chronic obstructive pulmonary disease with (acute) exacerbation: Secondary | ICD-10-CM

## 2019-08-10 DIAGNOSIS — J9602 Acute respiratory failure with hypercapnia: Secondary | ICD-10-CM

## 2019-08-10 DIAGNOSIS — J189 Pneumonia, unspecified organism: Secondary | ICD-10-CM

## 2019-08-10 LAB — MAGNESIUM: Magnesium: 1.6 mg/dL — ABNORMAL LOW (ref 1.7–2.4)

## 2019-08-10 LAB — LEGIONELLA PNEUMOPHILA SEROGP 1 UR AG: L. pneumophila Serogp 1 Ur Ag: NEGATIVE

## 2019-08-10 LAB — BASIC METABOLIC PANEL
Anion gap: 9 (ref 5–15)
BUN: 10 mg/dL (ref 6–20)
CO2: 26 mmol/L (ref 22–32)
Calcium: 8.9 mg/dL (ref 8.9–10.3)
Chloride: 104 mmol/L (ref 98–111)
Creatinine, Ser: 0.82 mg/dL (ref 0.44–1.00)
GFR calc Af Amer: >60 mL/min (ref >60)
GFR calc non Af Amer: >60 mL/min (ref >60)
Glucose, Bld: 212 mg/dL — ABNORMAL HIGH (ref 70–99)
Potassium: 3.9 mmol/L (ref 3.5–5.1)
Sodium: 139 mmol/L (ref 135–145)

## 2019-08-10 LAB — GLUCOSE, CAPILLARY
Glucose-Capillary: 195 mg/dL — ABNORMAL HIGH (ref 70–99)
Glucose-Capillary: 222 mg/dL — ABNORMAL HIGH (ref 70–99)
Glucose-Capillary: 220 mg/dL — ABNORMAL HIGH (ref 70–99)
Glucose-Capillary: 187 mg/dL — ABNORMAL HIGH (ref 70–99)
Glucose-Capillary: 186 mg/dL — ABNORMAL HIGH (ref 70–99)
Glucose-Capillary: 176 mg/dL — ABNORMAL HIGH (ref 70–99)

## 2019-08-10 LAB — CBC WITH DIFFERENTIAL/PLATELET
Abs Immature Granulocytes: 0.24 10*3/uL — ABNORMAL HIGH (ref 0.00–0.07)
Basophils Absolute: 0 10*3/uL (ref 0.0–0.1)
Basophils Relative: 1 %
Eosinophils Absolute: 0 10*3/uL (ref 0.0–0.5)
Eosinophils Relative: 0 %
HCT: 37.6 % (ref 36.0–46.0)
Hemoglobin: 11.6 g/dL — ABNORMAL LOW (ref 12.0–15.0)
Immature Granulocytes: 3 %
Lymphocytes Relative: 13 %
Lymphs Abs: 1 10*3/uL (ref 0.7–4.0)
MCH: 26.1 pg (ref 26.0–34.0)
MCHC: 30.9 g/dL (ref 30.0–36.0)
MCV: 84.5 fL (ref 80.0–100.0)
Monocytes Absolute: 0.2 10*3/uL (ref 0.1–1.0)
Monocytes Relative: 2 %
Neutro Abs: 6 10*3/uL (ref 1.7–7.7)
Neutrophils Relative %: 81 %
Platelets: 219 10*3/uL (ref 150–400)
RBC: 4.45 MIL/uL (ref 3.87–5.11)
RDW: 14.5 % (ref 11.5–15.5)
WBC: 7.5 10*3/uL (ref 4.0–10.5)
nRBC: 0 % (ref 0.0–0.2)

## 2019-08-10 LAB — PHOSPHORUS: Phosphorus: 3 mg/dL (ref 2.5–4.6)

## 2019-08-10 MED ORDER — PREDNISONE 10 MG PO TABS
40.0000 mg | ORAL_TABLET | Freq: Every day | ORAL | Status: DC
  Administered 2019-08-11 – 2019-08-12 (×2): 40 mg via ORAL
  Filled 2019-08-10 (×2): qty 4

## 2019-08-10 MED ORDER — MAGNESIUM SULFATE 2 GM/50ML IV SOLN
2.0000 g | Freq: Once | INTRAVENOUS | Status: AC
  Administered 2019-08-10: 2 g via INTRAVENOUS
  Filled 2019-08-10: qty 50

## 2019-08-10 MED ORDER — SENNOSIDES-DOCUSATE SODIUM 8.6-50 MG PO TABS
2.0000 | ORAL_TABLET | Freq: Two times a day (BID) | ORAL | Status: DC
  Administered 2019-08-10 – 2019-08-11 (×2): 2
  Filled 2019-08-10 (×3): qty 2

## 2019-08-10 MED ORDER — CLONAZEPAM 0.5 MG PO TABS
0.5000 mg | ORAL_TABLET | Freq: Three times a day (TID) | ORAL | Status: DC | PRN
  Administered 2019-08-10: 0.5 mg via ORAL
  Filled 2019-08-10: qty 1

## 2019-08-10 MED ORDER — INSULIN DETEMIR 100 UNIT/ML ~~LOC~~ SOLN
3.0000 [IU] | Freq: Every day | SUBCUTANEOUS | Status: DC
  Administered 2019-08-10 – 2019-08-11 (×2): 3 [IU] via SUBCUTANEOUS
  Filled 2019-08-10 (×3): qty 0.03

## 2019-08-10 MED ORDER — FAMOTIDINE 20 MG PO TABS
20.0000 mg | ORAL_TABLET | Freq: Two times a day (BID) | ORAL | Status: DC
  Administered 2019-08-10 – 2019-08-12 (×5): 20 mg
  Filled 2019-08-10 (×6): qty 1

## 2019-08-10 NOTE — Progress Notes (Signed)
CRITICAL CARE NOTE  59 year old female, DNR/DNI, with past medical history notable for severe COPD requiring chronic trach who is admitted 08/07/19 for acute respiratory failure and sepsis secondary to suspected HCAP/VAP.   SIGNIFICANT EVENTS  3/5- Admission to ICU 3/6 severe pneumonia-previous h/o multidrug resistant pseudomonas 3/7 severe pneumonia 3/8 severe pneumonia, remains on vent  STUDIES:  3/4- CXR: Tracheostomy tube is again seen in satisfactory position. Lungs are well aerated bilaterally. No focal infiltrate or sizable effusion is seen. No bony abnormality is noted. Cardiac shadow is within normal limits. No bony abnormality is noted. 3/5: KUB: No acute abnormality noted.   CULTURES: SARS-CoV-2 PCR 3/4>> negative Influenza PCR 3/4>> negative Blood culture x2 3/4>> Tracheal aspirate 3/5>> Strep pneumo urinary antigen 3/5>> Legionella urinary antigen 3/5>>  ANTIBIOTICS: Aztreonam 3/4 x dose Flagyl 3/4 x 1 dose Ceftazidime 3/5>> Vancomycin 3/4>>    CC  Follow up resp failure Follow up vent support   SUBJECTIVE Remains ill Prognosis is guarded On vent chronic +pneumonia   BP (!) 156/82 (BP Location: Left Arm)   Pulse 78   Temp 97.9 F (36.6 C) (Oral)   Resp 17   Ht 5' 0.98" (1.549 m)   Wt 84.6 kg   LMP 03/06/2005 (Approximate)   SpO2 99%   BMI 35.26 kg/m    I/O last 3 completed shifts: In: 1728.6 [P.O.:120; I.V.:850; IV Piggyback:758.6] Out: 3350 [Urine:3350] Total I/O In: 148 [IV Piggyback:148] Out: 0   SpO2: 99 % FiO2 (%): 36 %    Review of Systems:  Gen:  Denies  fever, sweats, chills weight loss  HEENT: Denies blurred vision, double vision, ear pain, eye pain, hearing loss, nose bleeds, sore throat Cardiac:  No dizziness, chest pain or heaviness, chest tightness,edema, No JVD Resp:   + cough, -sputum production, +shortness of breath,+wheezing, -hemoptysis,  Gi: Denies swallowing difficulty, stomach pain, nausea or vomiting,  diarrhea, constipation, bowel incontinence Other:  All other systems negative  PHYSICAL EXAMINATION:  GENERAL:critically ill appearing,  HEAD: Normocephalic, atraumatic.  EYES: Pupils equal, round, reactive to light.  No scleral icterus.  MOUTH: Moist mucosal membrane. NECK: Supple. No thyromegaly. No nodules. No JVD.  PULMONARY: +rhonchi, +wheezing CARDIOVASCULAR: S1 and S2. Regular rate and rhythm. No murmurs, rubs, or gallops.  GASTROINTESTINAL: Soft, nontender, -distended. Positive bowel sounds.  MUSCULOSKELETAL: + edema.  NEUROLOGIC: alert and awake SKIN:intact,warm,dry      MEDICATIONS: I have reviewed all medications and confirmed regimen as documented   CULTURE RESULTS   Recent Results (from the past 240 hour(s))  Blood Culture (routine x 2)     Status: Abnormal   Collection Time: 08/06/19 10:49 PM   Specimen: BLOOD  Result Value Ref Range Status   Specimen Description   Final    BLOOD LEFT WRIST Performed at [facility], [address] [place], [place] [address]    Special Requests   Final    BOTTLES DRAWN AEROBIC AND ANAEROBIC Blood Culture adequate volume Performed at [facility], [address] [place], [place] [address]    Culture  Setup Time   Final    GRAM POSITIVE COCCI ANAEROBIC BOTTLE ONLY CRITICAL RESULT CALLED TO, READ BACK BY AND VERIFIED WITH: KRISTIN MERRILL AT 1330 08/07/19.PMF Performed at [facility], [address] [place], [place] [address]    Culture (A)  Final    STAPHYLOCOCCUS SPECIES (COAGULASE NEGATIVE) THE SIGNIFICANCE OF ISOLATING THIS ORGANISM FROM A SINGLE SET OF BLOOD CULTURES WHEN MULTIPLE SETS ARE DRAWN IS UNCERTAIN.  PLEASE NOTIFY THE MICROBIOLOGY DEPARTMENT WITHIN ONE WEEK IF SPECIATION AND SENSITIVITIES ARE REQUIRED. Performed at [facility], [place] [address], [place], [place] [address]    Report Status 08/09/2019 FINAL  Final  Respiratory Panel by RT PCR (Flu A&B, Covid) -  Nasopharyngeal Swab     Status: None   Collection Time: 08/06/19 10:55 PM   Specimen: Nasopharyngeal Swab  Result Value Ref Range Status   SARS Coronavirus 2 by RT PCR NEGATIVE NEGATIVE Final    Comment: (NOTE) SARS-CoV-2 target nucleic acids are NOT DETECTED. The SARS-CoV-2 RNA is generally detectable in upper respiratoy specimens during the acute phase of infection. The lowest concentration of SARS-CoV-2 viral copies this assay can detect is 131 copies/mL. A negative result does not preclude SARS-Cov-2 infection and should not be used as the sole basis for treatment or other patient management decisions. A negative result may occur with  improper specimen collection/handling, submission of specimen other than nasopharyngeal swab, presence of viral mutation(s) within the areas targeted by this assay, and inadequate number of viral copies (<131 copies/mL). A negative result must be combined with clinical observations, patient history, and epidemiological information. The expected result is Negative. Fact Sheet for Patients:  PinkCheek.be Fact Sheet for Healthcare Providers:  GravelBags.it This test is not yet ap proved or cleared by the [place] FDA and  has been authorized for detection and/or diagnosis of SARS-CoV-2 by FDA under an Emergency Use Authorization (EUA). This EUA will remain  in effect (meaning this test can be used) for the duration of the COVID-19 declaration under Section 564(b)(1) of the Act, 21 U.S.C. section 360bbb-3(b)(1), unless the authorization is terminated or revoked sooner.    Influenza A by PCR NEGATIVE NEGATIVE Final   Influenza B by PCR NEGATIVE NEGATIVE Final    Comment: (NOTE) The Xpert Xpress SARS-CoV-2/FLU/RSV assay is intended as an aid in  the diagnosis of influenza from Nasopharyngeal swab specimens and  should not be used as a sole basis for treatment. Nasal washings and  aspirates  are unacceptable for Xpert Xpress SARS-CoV-2/FLU/RSV  testing. Fact Sheet for Patients: PinkCheek.be Fact Sheet for Healthcare Providers: GravelBags.it This test is not yet approved or cleared by the [place] FDA and  has been authorized for detection and/or diagnosis of SARS-CoV-2 by  FDA under an Emergency Use Authorization (EUA). This EUA will remain  in effect (meaning this test can be used) for the duration of the  Covid-19 declaration under Section 564(b)(1) of the Act, 21  U.S.C. section 360bbb-3(b)(1), unless the authorization is  terminated or revoked. Performed at [facility], [address] [place], [place] [address]   Blood Culture (routine x 2)     Status: None (Preliminary result)   Collection Time: 08/06/19 11:16 PM   Specimen: BLOOD  Result Value Ref Range Status   Specimen Description BLOOD RIGHT FOREARM  Final   Special Requests   Final    BOTTLES DRAWN AEROBIC AND ANAEROBIC Blood Culture results may not be optimal due to an inadequate volume of blood received in culture bottles   Culture   Final    NO GROWTH 4 DAYS Performed at [facility], [address], [place], [place] [address]    Report Status PENDING  Incomplete  MRSA PCR Screening     Status: Abnormal   Collection Time: 08/07/19  1:49 AM   Specimen: Nasopharyngeal  Result Value Ref Range Status   MRSA by PCR POSITIVE (A) NEGATIVE Final  Comment:        The GeneXpert MRSA Assay (FDA approved for NASAL specimens only), is one component of a comprehensive MRSA colonization surveillance program. It is not intended to diagnose MRSA infection nor to guide or monitor treatment for MRSA infections. RESULT CALLED TO, READ BACK BY AND VERIFIED WITHGypsy Lore [id number] 08/07/19 HNM Performed at [facility], [address], [place], [place] [address]   Urine culture     Status: None   Collection Time: 08/07/19   1:34 PM   Specimen: In/Out Cath Urine  Result Value Ref Range Status   Specimen Description   Final    IN/OUT CATH URINE Performed at [facility], [address], [place], [place] [address]    Special Requests   Final    NONE Performed at [facility], [address] [place], [place] [address]    Culture   Final    NO GROWTH Performed at [facility], [place] [address], [place], [place] [address]    Report Status 08/08/2019 FINAL  Final        Ventilator continued, requirement due to severe respiratory failure       ASSESSMENT AND PLAN SYNOPSIS  Severe ACUTE Hypoxic and Hypercapnic Respiratory Failure HCAP/VAP vent at home with aspiration pneunmonia  SEVERE COPD EXACERBATION -continue steroids as prescribed -continue NEB THERAPY as prescribed  CARDIAC ICU monitoring  INFECTIOUS DISEASE -continue antibiotics as prescribed   GI GI PROPHYLAXIS as indicated  NUTRITIONAL STATUS DIET-->as tolerated Constipation protocol as indicated   ENDO - ICU hypoglycemic\Hyperglycemia protocol -check FSBS per protocol  ELECTROLYTES -follow labs as needed -replace as needed -pharmacy consultation and following   NEUROLOGY - intubated and sedated - minimal sedation to achieve a RASS goal: -1  DVT/GI PRX ordered TRANSFUSIONS AS NEEDED MONITOR FSBS ASSESS the need for LABS as needed   Corrin Parker, M.D.  [facility]  Medical Director [place] Director [place] Department

## 2019-08-10 NOTE — Progress Notes (Addendum)
Inpatient Diabetes Program Recommendations  AACE/ADA: New Consensus Statement on Inpatient Glycemic Control (2015)  Target Ranges:  Prepandial:   less than 140 mg/dL      Peak postprandial:   less than 180 mg/dL (1-2 hours)      Critically ill patients:  140 - 180 mg/dL   Results for HOPE, ROSENDO (MRN [id number]) as of 08/10/2019 14:11  Ref. Range 08/09/2019 23:52 08/10/2019 04:12 08/10/2019 07:15 08/10/2019 11:46  Glucose-Capillary Latest Ref Range: 70 - 99 mg/dL 139 (H) 195 (H) 222 (H) 220 (H)    Home DM Meds: Levemir 25 units BID        Humalog 3-14 units TID per SSI    Current Orders: Novolog Moderate Correction Scale/ SSI (0-15 units) Q4 hours    Solumedrol 40 mg Daily started last PM.   BGs on the rise since Solumedrol started.   Poor PO intake so far.   MD- Please consider starting a small portion of pt's home dose of Levemir while she is getting Solumedrol:  Levemir 6 units BID (25% total home dose)     --Will follow patient during hospitalization--  Wyn Quaker RN, MSN, CDE Diabetes Coordinator Inpatient Glycemic Control Team Team Pager: [phone] (8a-5p)'

## 2019-08-10 NOTE — Progress Notes (Signed)
Pharmacy Antibiotic Note  Michelle [name] is a 59 y.o. female admitted on 08/06/2019 with pneumonia.  Pharmacy has been consulted for Ceftazidime and Vancomycin dosing. She has a recent history of tracheal aspirates that grew out S marcescens and Pseudomonas sensitive to ceftazidime.  Plan: 1) continue ceftazidime 2gm IV q8hrs for 7 days,   2)  Vancomycin discontinued. Procalcitonin ordered with am labs.   Height: 5' 0.98" (154.9 cm) Weight: 186 lb 8.2 oz (84.6 kg) IBW/kg (Calculated) : 47.76  Temp (24hrs), Avg:98.4 F (36.9 C), Min:97.9 F (36.6 C), Max:98.7 F (37.1 C)  Recent Labs  Lab 08/06/19 2249 08/07/19 0059 08/07/19 0520 08/08/19 0540 08/08/19 0747 08/10/19 0100 08/10/19 0501  WBC 16.3*  --  13.9*  --  7.3  --  7.5  CREATININE 0.69  --  1.00 1.12*  --   --  0.82  LATICACIDVEN 1.4 1.0  --   --   --   --   --   VANCOPEAK  --   --   --   --   --  63*  --     Estimated Creatinine Clearance: 73.8 mL/min (by C-G formula based on SCr of 0.82 mg/dL).    Allergies  Allergen Reactions  . Amoxicillin     Patient has tolerated cephalosporins in the past  . Ativan [Lorazepam]     Makes anxiety worse     Antimicrobials this admission: Aztreonam 3/4 >> 3/5 Ceftazidime 3/5 >> 3/11 Vancomycin 3/5 >> 3/8  Microbiology results: 3/4 BCx: 1/4 GPC 3/4 SARS CoV-2: negative 3/4 influenza A/B: negative    3/5 MRSA PCR: positive  Thank you for allowing pharmacy to be a part of this patient's care.  Gisela Lea L, RPh 08/10/2019 4:04 PM

## 2019-08-10 NOTE — Progress Notes (Signed)
PHARMACIST - PHYSICIAN COMMUNICATION  DR:   Mortimer Fries  CONCERNING: IV to Oral Route Change Policy  RECOMMENDATION: This patient is receiving famotidine by the intravenous route.  Based on criteria approved by the Pharmacy and Therapeutics Committee, the intravenous medication(s) is/are being converted to the equivalent oral dose form(s).   DESCRIPTION: These criteria include:  The patient is eating (either orally or via tube) and/or has been taking other orally administered medications for a least 24 hours  The patient has no evidence of active gastrointestinal bleeding or impaired GI absorption (gastrectomy, short bowel, patient on TNA or NPO).  If you have questions about this conversion, please contact the Pharmacy Department  []   [phone] )  [facility] []   [phone] )  [facility] []   [phone] )  [facility] []   [phone] )  [facility] []   [phone] )  [place], [facility] 08/10/2019 9:23 AM

## 2019-08-10 NOTE — Progress Notes (Signed)
Patients trach changed out with another 6 [place], Dr Mortimer Fries at bedside with change. No adverse reactions noted.

## 2019-08-10 NOTE — Progress Notes (Signed)
Patient is currently followed by [facility] community Palliative program at home. TOC Meagan Hagwood made aware. Thank you. Flo Shanks BSN, RN, [place] [phone]

## 2019-08-10 NOTE — TOC Progression Note (Addendum)
Transition of Care [facility]) - Progression Note    Patient Details  Name: Michelle [name] MRN: [id number] Date of Birth: [birth year]  Transition of Care [facility]) CM/SW [place], LCSW Phone Number: 08/10/2019, 2:26 PM  Clinical Narrative:   CSW called [facility] at number provided by daughter [phone]) and spoke with Representative Derrick. Derrick reported patient has around 56 hours per week of RN services at home which can continue after patient is discharged. Provided CSW contact information for any questions/needs. CSW will continue to follow.  2:40- Received call from Lisbeth Ply (Clinical Supervisor) at [facility]. CSW provided contact information for ICU (main number and fax number) per Vivian's request.    Expected Discharge Plan: [place] Barriers to Discharge: Continued Medical Work up  Expected Discharge Plan and Services Expected Discharge Plan: [place] arrangements for the past 2 months: Single Family Home                                       Social Determinants of Health (SDOH) Interventions    Readmission Risk Interventions Readmission Risk Prevention Plan 08/07/2019 09/19/2018  Transportation Screening - Complete  PCP or Specialist Appt within 5-7 Days - Complete  Home Care Screening - Complete  Medication Review (RN CM) - Complete  PCP or Specialist appointment within 3-5 days of discharge Complete -  [place] or Home Care Consult Complete -  Palliative Care Screening Complete -  Some recent data might be hidden

## 2019-08-10 NOTE — Progress Notes (Signed)
Pharmacy Electrolyte Monitoring Consult:  Pharmacy consulted to assist in monitoring and replacing electrolytes in this 59 y.o. female admitted on 08/06/2019 with Shortness of Breath   Labs:  Sodium (mmol/L)  Date Value  08/10/2019 139  07/11/2014 139   Potassium (mmol/L)  Date Value  08/10/2019 3.9  07/11/2014 4.6   Magnesium (mg/dL)  Date Value  08/10/2019 1.6 (L)  10/31/2013 2.6 (H)   Phosphorus (mg/dL)  Date Value  08/10/2019 3.0  10/31/2013 4.6   Calcium (mg/dL)  Date Value  08/10/2019 8.9   Calcium, Total (mg/dL)  Date Value  07/11/2014 8.9   Albumin (g/dL)  Date Value  08/08/2019 3.2 (L)  07/09/2014 3.7    Assessment/Plan: 1. Electrolytes: LR discontinued. Total replacement of magnesium 4g IV. Electrolytes with am labs.   2.Glucose: Solumedrol transitioned to prednisone. SSI Q4hr. Levemir 3 units Qhs.   3. Constipation: Patient refusing Miralax. Will order senna/docusate 2 tabs VT BID.   Pharmacy will continue to monitor and adjust per consult.   Janice Seales L 08/10/2019 4:06 PM

## 2019-08-11 DIAGNOSIS — J9620 Acute and chronic respiratory failure, unspecified whether with hypoxia or hypercapnia: Secondary | ICD-10-CM

## 2019-08-11 LAB — CULTURE, BLOOD (ROUTINE X 2): Culture: NO GROWTH

## 2019-08-11 LAB — VANCOMYCIN, PEAK: Vancomycin Pk: 63 ug/mL (ref 30–40)

## 2019-08-11 LAB — GLUCOSE, CAPILLARY
Glucose-Capillary: 117 mg/dL — ABNORMAL HIGH (ref 70–99)
Glucose-Capillary: 118 mg/dL — ABNORMAL HIGH (ref 70–99)
Glucose-Capillary: 128 mg/dL — ABNORMAL HIGH (ref 70–99)
Glucose-Capillary: 283 mg/dL — ABNORMAL HIGH (ref 70–99)
Glucose-Capillary: 290 mg/dL — ABNORMAL HIGH (ref 70–99)
Glucose-Capillary: 251 mg/dL — ABNORMAL HIGH (ref 70–99)

## 2019-08-11 MED ORDER — ENSURE MAX PROTEIN PO LIQD
11.0000 [oz_av] | Freq: Two times a day (BID) | ORAL | Status: DC
  Filled 2019-08-11: qty 330

## 2019-08-11 NOTE — Progress Notes (Addendum)
[facility] came and assess machine functionality and was cleared by them to be used by the patient. Machine screen was lit up and settings were visible at this time. Per home care therapist, it machine repeats the issue while patient is being observed, contact them and a new machine will be brought to patient. Patient placed her home machine to be observed before patient going home.

## 2019-08-11 NOTE — Progress Notes (Signed)
It was discussed in 10am rounds about attempting to place patient back on her home vent to be sure the vent is working correctly and to see if she feels that the machine is adequately  Supporting her breathing. When this RT attempted to turn home vent on the screen stayed dark with the settings not visible. This RT made sure electrical cord was securely in electrical outlet as well as secure at the machine. RN was notified and the decision was made to call [facility] to come and assess machine functionality.

## 2019-08-11 NOTE — Progress Notes (Signed)
CRITICAL CARE NOTE 59 year old female, DNR/DNI, with past medical history notable for severe COPD requiring chronic trach who is admitted 08/07/19 for acute respiratory failure and sepsis secondary to suspected HCAP/VAP.   SIGNIFICANT EVENTS  3/5- Admission to ICU 3/6 severe pneumonia-previous h/o multidrug resistant pseudomonas 3/7 severe pneumonia 3/8 severe pneumonia, remains on vent  STUDIES:  3/4- CXR: Tracheostomy tube is again seen in satisfactory position. Lungs are well aerated bilaterally. No focal infiltrate or sizable effusion is seen. No bony abnormality is noted. Cardiac shadow is within normal limits. No bony abnormality is noted. 3/5: KUB: No acute abnormality noted.   CULTURES: SARS-CoV-2 PCR 3/4>> negative Influenza PCR 3/4>> negative Blood culture x2 3/4>> Tracheal aspirate 3/5>> Strep pneumo urinary antigen 3/5>> Legionella urinary antigen 3/5>>  ANTIBIOTICS: Aztreonam 3/4 x dose Flagyl 3/4 x 1 dose Ceftazidime 3/5>> Vancomycin 3/4>>3/8   CC  follow up respiratory failure  SUBJECTIVE On vent Prognosis is guarded Decreased secretions  BP (!) 170/79   Pulse 61   Temp 98.2 F (36.8 C) (Oral)   Resp 20   Ht 5' 0.98" (1.549 m)   Wt 84.6 kg   LMP 03/06/2005 (Approximate)   SpO2 98%   BMI 35.26 kg/m    I/O last 3 completed shifts: In: 393.2 [IV Piggyback:393.2] Out: 2500 [Urine:2500] No intake/output data recorded.  SpO2: 98 % FiO2 (%): 36 %  Estimated body mass index is 35.26 kg/m as calculated from the following:   Height as of this encounter: 5' 0.98" (1.549 m).   Weight as of this encounter: 84.6 kg.  SIGNIFICANT EVENTS  Review of Systems:  Gen:  Denies  fever, sweats, chills weight loss  HEENT: Denies blurred vision, double vision, ear pain, eye pain, hearing loss, nose bleeds, sore throat Cardiac:  No dizziness, chest pain or heaviness, chest tightness,edema, No JVD Resp:   No cough, -sputum production, -shortness of  breath,-wheezing, -hemoptysis,  Gi: Denies swallowing difficulty, stomach pain, nausea or vomiting, diarrhea, constipation, bowel incontinence Other:  All other systems negative    PHYSICAL EXAMINATION:  GENERAL:critically ill appearing, HEAD: Normocephalic, atraumatic.  EYES: Pupils equal, round, reactive to light.  No scleral icterus.  MOUTH: Moist mucosal membrane. NECK: Supple.  PULMONARY: +rhonchi, CARDIOVASCULAR: S1 and S2. Regular rate and rhythm. No murmurs, rubs, or gallops.  GASTROINTESTINAL: Soft, nontender, -distended.  Positive bowel sounds.   MUSCULOSKELETAL: +edema.  NEUROLOGIC: alert and awake SKIN:intact,warm,dry  MEDICATIONS: I have reviewed all medications and confirmed regimen as documented   CULTURE RESULTS   Recent Results (from the past 240 hour(s))  Blood Culture (routine x 2)     Status: Abnormal   Collection Time: 08/06/19 10:49 PM   Specimen: BLOOD  Result Value Ref Range Status   Specimen Description   Final    BLOOD LEFT WRIST Performed at [facility], [address], [place], [place] [address]    Special Requests   Final    BOTTLES DRAWN AEROBIC AND ANAEROBIC Blood Culture adequate volume Performed at [facility], [address], [place], [place] [address]    Culture  Setup Time   Final    GRAM POSITIVE COCCI ANAEROBIC BOTTLE ONLY CRITICAL RESULT CALLED TO, READ BACK BY AND VERIFIED WITH: KRISTIN MERRILL AT 1330 08/07/19.PMF Performed at [facility], [place], [place] [address]    Culture (A)  Final    STAPHYLOCOCCUS SPECIES (COAGULASE NEGATIVE) THE SIGNIFICANCE OF ISOLATING THIS ORGANISM FROM A SINGLE SET OF BLOOD CULTURES WHEN MULTIPLE SETS  ARE DRAWN IS UNCERTAIN. PLEASE NOTIFY THE MICROBIOLOGY DEPARTMENT WITHIN ONE WEEK IF SPECIATION AND SENSITIVITIES ARE REQUIRED. Performed at [facility], [place] [address], [place], [place] [address]    Report Status 08/09/2019 FINAL  Final   Respiratory Panel by RT PCR (Flu A&B, Covid) - Nasopharyngeal Swab     Status: None   Collection Time: 08/06/19 10:55 PM   Specimen: Nasopharyngeal Swab  Result Value Ref Range Status   SARS Coronavirus 2 by RT PCR NEGATIVE NEGATIVE Final    Comment: (NOTE) SARS-CoV-2 target nucleic acids are NOT DETECTED. The SARS-CoV-2 RNA is generally detectable in upper respiratoy specimens during the acute phase of infection. The lowest concentration of SARS-CoV-2 viral copies this assay can detect is 131 copies/mL. A negative result does not preclude SARS-Cov-2 infection and should not be used as the sole basis for treatment or other patient management decisions. A negative result may occur with  improper specimen collection/handling, submission of specimen other than nasopharyngeal swab, presence of viral mutation(s) within the areas targeted by this assay, and inadequate number of viral copies (<131 copies/mL). A negative result must be combined with clinical observations, patient history, and epidemiological information. The expected result is Negative. Fact Sheet for Patients:  PinkCheek.be Fact Sheet for Healthcare Providers:  GravelBags.it This test is not yet ap proved or cleared by the [place] FDA and  has been authorized for detection and/or diagnosis of SARS-CoV-2 by FDA under an Emergency Use Authorization (EUA). This EUA will remain  in effect (meaning this test can be used) for the duration of the COVID-19 declaration under Section 564(b)(1) of the Act, 21 U.S.C. section 360bbb-3(b)(1), unless the authorization is terminated or revoked sooner.    Influenza A by PCR NEGATIVE NEGATIVE Final   Influenza B by PCR NEGATIVE NEGATIVE Final    Comment: (NOTE) The Xpert Xpress SARS-CoV-2/FLU/RSV assay is intended as an aid in  the diagnosis of influenza from Nasopharyngeal swab specimens and  should not be used as a sole  basis for treatment. Nasal washings and  aspirates are unacceptable for Xpert Xpress SARS-CoV-2/FLU/RSV  testing. Fact Sheet for Patients: PinkCheek.be Fact Sheet for Healthcare Providers: GravelBags.it This test is not yet approved or cleared by the [place] FDA and  has been authorized for detection and/or diagnosis of SARS-CoV-2 by  FDA under an Emergency Use Authorization (EUA). This EUA will remain  in effect (meaning this test can be used) for the duration of the  Covid-19 declaration under Section 564(b)(1) of the Act, 21  U.S.C. section 360bbb-3(b)(1), unless the authorization is  terminated or revoked. Performed at [facility], [address] [place], [place] [address]   Blood Culture (routine x 2)     Status: None   Collection Time: 08/06/19 11:16 PM   Specimen: BLOOD  Result Value Ref Range Status   Specimen Description BLOOD RIGHT FOREARM  Final   Special Requests   Final    BOTTLES DRAWN AEROBIC AND ANAEROBIC Blood Culture results may not be optimal due to an inadequate volume of blood received in culture bottles   Culture   Final    NO GROWTH 5 DAYS Performed at [facility], [address] [place], [place] [address]    Report Status 08/11/2019 FINAL  Final  MRSA PCR Screening     Status: Abnormal   Collection Time: 08/07/19  1:49 AM   Specimen: Nasopharyngeal  Result Value Ref Range Status   MRSA by PCR POSITIVE (A)  NEGATIVE Final    Comment:        The GeneXpert MRSA Assay (FDA approved for NASAL specimens only), is one component of a comprehensive MRSA colonization surveillance program. It is not intended to diagnose MRSA infection nor to guide or monitor treatment for MRSA infections. RESULT CALLED TO, READ BACK BY AND VERIFIED WITHGypsy Lore [id number] 08/07/19 HNM Performed at [facility], [address], [place], [place] [address]   Urine culture     Status:  None   Collection Time: 08/07/19  1:34 PM   Specimen: In/Out Cath Urine  Result Value Ref Range Status   Specimen Description   Final    IN/OUT CATH URINE Performed at [facility], [address] [place], [place] [address]    Special Requests   Final    NONE Performed at [facility], [address] [place], [place] [address]    Culture   Final    NO GROWTH Performed at [facility], [place] [address], [place], [place] [address]    Report Status 08/08/2019 FINAL  Final           Ventilator continued, requirement due to severe respiratory failure          ASSESSMENT AND PLAN SYNOPSIS  Severe ACUTE Hypoxic and Hypercapnic Respiratory Failure due to  HCAP/VAP vent at home with aspiration pneumonia   Severe ACUTE Hypoxic and Hypercapnic Respiratory Failure Home vent to be used today  SEVERE COPD EXACERBATION from aspiration pneumonia Continue steroids BD therapy   NEUROLOGY Avoid heavy sedatives  SD  monitoring  ID -continue IV abx as prescibed -follow up cultures  GI GI PROPHYLAXIS as indicated  NUTRITIONAL STATUS  DIET-->as tolerated Constipation protocol as indicated  ENDO - will use ICU hypoglycemic\Hyperglycemia protocol if indicated   ELECTROLYTES -follow labs as needed -replace as needed -pharmacy consultation and following   DVT/GI PRX ordered TRANSFUSIONS AS NEEDED MONITOR FSBS ASSESS the need for LABS as needed     Corrin Parker, M.D.  [facility]  Medical Director [place] Director [place] Department

## 2019-08-11 NOTE — TOC Progression Note (Signed)
Transition of Care [facility]) - Progression Note    Patient Details  Name: Michelle [name] MRN: [id number] Date of Birth: [birth year]  Transition of Care [facility]) CM/SW [place], LCSW Phone Number: 08/11/2019, 11:10 AM  Clinical Narrative:   Informed [facility] Representative Brad that patient's Trilogy may not be working properly. Per rounds, staff plans to try it in patient's room to see if it is working or not.    Expected Discharge Plan: [place] Barriers to Discharge: Continued Medical Work up  Expected Discharge Plan and Services Expected Discharge Plan: [place] arrangements for the past 2 months: Single Family Home                                       Social Determinants of Health (SDOH) Interventions    Readmission Risk Interventions Readmission Risk Prevention Plan 08/07/2019 09/19/2018  Transportation Screening - Complete  PCP or Specialist Appt within 5-7 Days - Complete  Home Care Screening - Complete  Medication Review (RN CM) - Complete  PCP or Specialist appointment within 3-5 days of discharge Complete -  [place] or Home Care Consult Complete -  Palliative Care Screening Complete -  Some recent data might be hidden

## 2019-08-11 NOTE — Progress Notes (Signed)
Pharmacy Electrolyte Monitoring Consult:  Pharmacy consulted to assist in monitoring and replacing electrolytes in this 59 y.o. female admitted on 08/06/2019 with Shortness of Breath   Labs:  Sodium (mmol/L)  Date Value  08/10/2019 139  07/11/2014 139   Potassium (mmol/L)  Date Value  08/10/2019 3.9  07/11/2014 4.6   Magnesium (mg/dL)  Date Value  08/10/2019 1.6 (L)  10/31/2013 2.6 (H)   Phosphorus (mg/dL)  Date Value  08/10/2019 3.0  10/31/2013 4.6   Calcium (mg/dL)  Date Value  08/10/2019 8.9   Calcium, Total (mg/dL)  Date Value  07/11/2014 8.9   Albumin (g/dL)  Date Value  08/08/2019 3.2 (L)  07/09/2014 3.7    Assessment/Plan: 1. Electrolytes: Patient refused labs this am. Will order again for am labs.   2.Glucose:  SSI Q4hr. Levemir 3 units Qhs.   3. Constipation: Patient refusing Miralax. Continue senna/docusate 2 tabs VT BID.   Pharmacy will continue to monitor and adjust per consult.   Lester Crickenberger L 08/11/2019 5:23 PM

## 2019-08-11 NOTE — Progress Notes (Signed)
Nutrition Follow-up  RD working remotely.  DOCUMENTATION CODES:   Obesity unspecified  INTERVENTION:  Provide Ensure Max Protein po BID, each supplement provides 150 kcal and 30 grams of protein.  NUTRITION DIAGNOSIS:   Inadequate oral intake related to inability to eat as evidenced by NPO status.  Diet now advanced but patient documented to only be eating 25% of meals.  GOAL:   Patient will meet greater than or equal to 90% of their needs  Progressing.  MONITOR:   Diet advancement, PO intake, Labs, Weight trends, I & O's  REASON FOR ASSESSMENT:   Ventilator    ASSESSMENT:   59 year old female with PMHx of COPD, emphysema, HTN, CAD, hx cocaine use, asthma, CHF, anxiety, hepatitis C, hx tracheostomy tube placement on 10/17/2017 on home vent, hx G-tube placement 10/31/2017 admitted with acute respiratory failure and sepsis secondary to suspected HCAP/VAP.  Patient unable to talk over the phone. Diet was advanced to regular on 3/5. Limited meal documentation completed but patient only documented to be eating 25% of her meals at this time.  Medications reviewed and include: famotidine, Novolog 0-15 units Q4hrs, Levemir 3 units QHS, Remeron 30 mg daily, Miralax 17 grams daily, prednisone 40 mg daily, Seroquel, senna-docusate 2 tablets BID, ceftazidime.  Labs reviewed: CBG 117-187.  Diet Order:   Diet Order            Diet regular Room service appropriate? Yes; Fluid consistency: Thin  Diet effective now             EDUCATION NEEDS:   No education needs have been identified at this time  Skin:  Skin Assessment: Reviewed RN Assessment  Last BM:  08/09/2019 per chart  Height:   Ht Readings from Last 1 Encounters:  08/06/19 5' 0.98" (1.549 m)   Weight:   Wt Readings from Last 1 Encounters:  08/09/19 84.6 kg   Ideal Body Weight:  47.7 kg  BMI:  Body mass index is 35.26 kg/m.  Estimated Nutritional Needs:   Kcal:  1500-1700  Protein:  85-95  grams  Fluid:  1.5-1.7 L/day  Jacklynn Barnacle, MS, RD, LDN Pager number available on Amion

## 2019-08-12 LAB — GLUCOSE, CAPILLARY
Glucose-Capillary: 114 mg/dL — ABNORMAL HIGH (ref 70–99)
Glucose-Capillary: 139 mg/dL — ABNORMAL HIGH (ref 70–99)
Glucose-Capillary: 144 mg/dL — ABNORMAL HIGH (ref 70–99)
Glucose-Capillary: 171 mg/dL — ABNORMAL HIGH (ref 70–99)
Glucose-Capillary: 188 mg/dL — ABNORMAL HIGH (ref 70–99)
Glucose-Capillary: 206 mg/dL — ABNORMAL HIGH (ref 70–99)

## 2019-08-12 LAB — BASIC METABOLIC PANEL
Anion gap: 9 (ref 5–15)
BUN: 12 mg/dL (ref 6–20)
CO2: 27 mmol/L (ref 22–32)
Calcium: 8.1 mg/dL — ABNORMAL LOW (ref 8.9–10.3)
Chloride: 104 mmol/L (ref 98–111)
Creatinine, Ser: 0.78 mg/dL (ref 0.44–1.00)
GFR calc Af Amer: >60 mL/min (ref >60)
GFR calc non Af Amer: >60 mL/min (ref >60)
Glucose, Bld: 191 mg/dL — ABNORMAL HIGH (ref 70–99)
Potassium: 3.2 mmol/L — ABNORMAL LOW (ref 3.5–5.1)
Sodium: 140 mmol/L (ref 135–145)

## 2019-08-12 LAB — CBC
HCT: 34.8 % — ABNORMAL LOW (ref 36.0–46.0)
Hemoglobin: 10.4 g/dL — ABNORMAL LOW (ref 12.0–15.0)
MCH: 25.7 pg — ABNORMAL LOW (ref 26.0–34.0)
MCHC: 29.9 g/dL — ABNORMAL LOW (ref 30.0–36.0)
MCV: 85.9 fL (ref 80.0–100.0)
Platelets: 210 10*3/uL (ref 150–400)
RBC: 4.05 MIL/uL (ref 3.87–5.11)
RDW: 15 % (ref 11.5–15.5)
WBC: 8.7 10*3/uL (ref 4.0–10.5)
nRBC: 0 % (ref 0.0–0.2)

## 2019-08-12 LAB — MAGNESIUM: Magnesium: 1.9 mg/dL (ref 1.7–2.4)

## 2019-08-12 LAB — PROCALCITONIN: Procalcitonin: <0.1 ng/mL

## 2019-08-12 MED ORDER — POTASSIUM CHLORIDE 20 MEQ/15ML (10%) PO SOLN
40.0000 meq | Freq: Two times a day (BID) | ORAL | Status: DC
  Administered 2019-08-12: 40 meq via ORAL
  Filled 2019-08-12: qty 30

## 2019-08-12 MED ORDER — POTASSIUM CHLORIDE 20 MEQ/15ML (10%) PO SOLN
40.0000 meq | ORAL | Status: AC
  Administered 2019-08-12: 40 meq via ORAL
  Filled 2019-08-12: qty 30

## 2019-08-12 MED ORDER — LEVOFLOXACIN 750 MG PO TABS: 750.0000 mg | ORAL_TABLET | Freq: Every day | ORAL | 0 refills | Status: DC

## 2019-08-12 MED ORDER — PREDNISONE 20 MG PO TABS: 20.0000 mg | ORAL_TABLET | Freq: Every day | ORAL | 1 refills | Status: DC

## 2019-08-12 MED ORDER — OXYCODONE HCL 5 MG PO TABS: 5.0000 mg | ORAL_TABLET | Freq: Every day | ORAL | Status: DC

## 2019-08-12 MED ORDER — GUAIFENESIN 100 MG/5ML PO SOLN: 5.0000 mL | ORAL | 0 refills | Status: DC | PRN

## 2019-08-12 MED ORDER — PREDNISONE 10 MG PO TABS: 40.0000 mg | ORAL_TABLET | Freq: Every day | ORAL | Status: DC

## 2019-08-12 MED ORDER — CLONAZEPAM 1 MG PO TABS
1.0000 mg | ORAL_TABLET | Freq: Three times a day (TID) | ORAL | Status: DC
  Administered 2019-08-12 (×2): 1 mg
  Filled 2019-08-12 (×2): qty 1

## 2019-08-12 MED ORDER — LEVOFLOXACIN 750 MG PO TABS
750.0000 mg | ORAL_TABLET | Freq: Every day | ORAL | Status: DC
  Administered 2019-08-12: 750 mg
  Filled 2019-08-12: qty 1

## 2019-08-12 MED ORDER — CHLORHEXIDINE GLUCONATE CLOTH 2 % EX PADS
6.0000 | MEDICATED_PAD | Freq: Every day | CUTANEOUS | Status: DC
  Administered 2019-08-12: 10:00:00 6 via TOPICAL

## 2019-08-12 NOTE — TOC Transition Note (Signed)
Transition of Care [facility]) - CM/SW Discharge Note   Patient Details  Name: ADRIEANNA ELEBY MRN: [id number] Date of Birth: Aug 09, 1960  [facility]) CM/SW Contact:  Magnus Ivan, LCSW Phone Number: 08/12/2019, 1:45 PM   Clinical Narrative:   Patient has orders to discharge home today. Patient will resume Home Health RN services through [facility]. Patient will also have support from [place]. Both agencies were made aware of the discharge. CSW will fax Discharge Summary to [facility] per their request. CSW completed EMS paperwork and put  Paperwork and DNR with patient's chart. CSW contacted non-emergent EMS and arranged transport. Informed patient's daughter, [name]. No other needs identified at this time. CSW signing off.     Final next level of care: [place] Barriers to Discharge: Barriers Resolved   Patient Goals and CMS Choice        Discharge Placement                Patient to be transferred to facility by: Non-emergent EMS Name of family member notified: [name] - daugher Patient and family notified of of transfer: 08/12/19  Discharge Plan and [place]: [employer] Date [facility] Agency Contacted: 08/12/19   Representative spoke with at [place]: [place] Determinants of Health ([place]) Interventions     Readmission Risk Interventions Readmission Risk Prevention Plan 08/07/2019 09/19/2018  Transportation Screening - Complete  PCP or Specialist Appt within 5-7 Days - Complete  Home Care Screening - Complete  Medication Review (RN CM) - Complete  PCP or Specialist appointment within 3-5 days of discharge Complete -  [place] or Home Care Consult Complete -  Palliative Care Screening Complete -  Some recent data might be hidden

## 2019-08-12 NOTE — Progress Notes (Signed)
[place] visited pt. as follow up from prior encounters during previous admission; pt. in bed lying down, able to talk despite trach.  Pt. shared she came to [facility] earlier this week w/SOB; plan is to be discharged today.  pt. expressed gratitude for visit, but has no needs at this time.       08/12/19 1600  Clinical Encounter Type  Visited With Patient  Visit Type Follow-up;Social support  Stress Factors  Patient Stress Factors None identified

## 2019-08-12 NOTE — Progress Notes (Signed)
Pharmacy Electrolyte Monitoring Consult:  Pharmacy consulted to assist in monitoring and replacing electrolytes in this 59 y.o. female admitted on 08/06/2019 with Shortness of Breath   Labs:  Sodium (mmol/L)  Date Value  08/12/2019 140  07/11/2014 139   Potassium (mmol/L)  Date Value  08/12/2019 3.2 (L)  07/11/2014 4.6   Magnesium (mg/dL)  Date Value  08/12/2019 1.9  10/31/2013 2.6 (H)   Phosphorus (mg/dL)  Date Value  08/10/2019 3.0  10/31/2013 4.6   Calcium (mg/dL)  Date Value  08/12/2019 8.1 (L)   Calcium, Total (mg/dL)  Date Value  07/11/2014 8.9   Albumin (g/dL)  Date Value  08/08/2019 3.2 (L)  07/09/2014 3.7    Assessment/Plan: 1. Electrolytes: Potassium 64mEq VT x 2 doses. Will order again for am labs.   2.Glucose:  SSI Q4hr. Levemir 5 units Qhs.   3. Constipation: Patient refusing Miralax. Continue senna/docusate 2 tabs VT BID.   Pharmacy will continue to monitor and adjust per consult.   Blayton Huttner L 08/12/2019 9:39 AM

## 2019-08-12 NOTE — Discharge Summary (Addendum)
Physician Discharge Summary  Patient ID: Michelle [name] MRN: [id number] DOB/AGE: 1961/03/16 59 y.o.  Admit date: 08/06/2019 Discharge date: 08/12/2019  Admission Diagnoses:COPD [place] AND HYPERCAPNIC RESP FAILURE  Discharge Diagnoses:  Active Problems:   Sepsis [facility])   Discharged Condition: STABLE  Hospital Course:  59 year old female, DNR/DNI, with past medical history notable for severe COPD requiring chronic trach who is admitted 08/07/19 for acute respiratory failure and sepsis secondary to suspected HCAP/VAP.   SIGNIFICANT EVENTS  3/5- Admission to ICU 3/6 severe pneumonia-previous h/o multidrug resistant pseudomonas 3/7 severe pneumonia 3/8 severe pneumonia, remains on vent 3/10 PNEUMONIA RESOLVING, TOLERATING HOME VENT PATIENT WANTS TO GO HOME NOW  STUDIES:  3/4- CXR: Tracheostomy tube is again seen in satisfactory position. Lungs are well aerated bilaterally. No focal infiltrate or sizable effusion is seen. No bony abnormality is noted. Cardiac shadow is within normal limits. No bony abnormality is noted. 3/5: KUB: No acute abnormality noted. 3/8 trach hanged out no problems 3/9 HOME VENT STARTED no ISSUES  CULTURES: SARS-CoV-2 PCR 3/4>> negative Influenza PCR 3/4>> negative ANTIBIOTICS: Aztreonam 3/4 x dose Flagyl 3/4 x 1 dose Ceftazidime 3/5>>3/9 Vancomycin 3/4>>3/8      Treatments: IV ABX IV STEROIDS HOME VENT TRACH CHANGED OUT 3/8  Severe ACUTE Hypoxic and Hypercapnic Respiratory Failure due to  HCAP/VAP vent at home with aspiration pneumonia-RESOLVED   Severe ACUTE Hypoxic and Hypercapnic Respiratory Failure-RESOLVED Home vent to be used today  SEVERE COPD EXACERBATION from aspiration pneumonia Continue steroids BD therapy   NEUROLOGY Avoid heavy sedatives  SD  monitoring  ID -continue IV abx as prescibed -follow up cultures  GI GI PROPHYLAXIS as  indicated  NUTRITIONAL STATUS  DIET-->as tolerated Constipation protocol as indicated  ENDO - will use ICU hypoglycemic\Hyperglycemia protocol if indicated   ELECTROLYTES -follow labs as needed -replace as needed -pharmacy consultation and following   DVT/GI PRX ordered TRANSFUSIONS AS NEEDED MONITOR FSBS ASSESS the need for LABS as needed   Discharge Exam: Blood pressure 126/80, pulse 73, temperature 98.6 F (37 C), temperature source Oral, resp. rate 20, height 5' 0.98" (1.549 m), weight 84.6 kg, last menstrual period 03/06/2005, SpO2 98 %.   Physical Examination:   General Appearance: No distress  Neuro:without focal findings,  speech normal,  HEENT: PERRLA, EOM intact.  S/p trach Pulmonary: normal breath sounds, No wheezing.  CardiovascularNormal S1,S2.  No m/r/g.   Abdomen: Benign, Soft, non-tender. PEG tube in place Renal:  No costovertebral tenderness  GU:  Not performed at this time. Endoc: No evident thyromegaly Skin:   warm, no rashes, no ecchymosis  Extremities: normal, no cyanosis, clubbing. PSYCHIATRIC: Mood, affect within normal limits.   ALL OTHER ROS ARE NEGATIVE   Disposition: Home with HOME VENT   Allergies as of 08/12/2019      Reactions   Amoxicillin    Patient has tolerated cephalosporins in the past   Ativan [lorazepam]    Makes anxiety worse       Medication List    TAKE these medications   aspirin 81 MG chewable tablet Chew 81 mg by mouth daily.   blood glucose meter kit and supplies Kit Dispense based on patient and insurance preference. Use up to four times daily as directed. (FOR ICD-9 250.00, 250.01).   budesonide 0.25 MG/2ML nebulizer solution Commonly known as: PULMICORT Take 2 mLs (0.25 mg total) by nebulization 2 (two) times daily. What changed: how much to take   cetirizine 10 MG tablet  Commonly known as: ZYRTEC Place 10 mg into feeding tube daily.   clonazePAM 0.5 MG tablet Commonly known as:  KLONOPIN Take 3 tablets (1.5 mg total) by mouth 3 (three) times daily.   cloNIDine 0.1 mg/24hr patch Commonly known as: CATAPRES - Dosed in mg/24 hr Place 0.1 mg onto the skin once a week.   Ensure Max Protein Liqd Take 330 mLs (11 oz total) by mouth 2 (two) times daily.   famotidine 20 MG tablet Commonly known as: PEPCID Place 1 tablet (20 mg total) into feeding tube daily.   ferrous sulfate 325 (65 FE) MG tablet Take 325 mg by mouth every other day.   fluticasone 50 MCG/ACT nasal spray Commonly known as: FLONASE Place 1 spray into both nostrils daily.   guaiFENesin 100 MG/5ML Soln Commonly known as: ROBITUSSIN Take 5 mLs (100 mg total) by mouth every 4 (four) hours as needed for cough or to loosen phlegm.   guaiFENesin-dextromethorphan 100-10 MG/5ML syrup Commonly known as: ROBITUSSIN DM Take 10 mLs by mouth every 6 (six) hours as needed for cough. What changed: how much to take   insulin aspart 100 UNIT/ML injection Commonly known as: novoLOG Inject 0-20 Units into the skin 3 (three) times daily with meals.   insulin detemir 100 UNIT/ML injection Commonly known as: LEVEMIR Inject 0.25 mLs (25 Units total) into the skin 2 (two) times daily.   ipratropium-albuterol 0.5-2.5 (3) MG/3ML Soln Commonly known as: DUONEB Take 3 mLs by nebulization every 6 (six) hours. What changed:   when to take this  reasons to take this   levofloxacin 750 MG tablet Commonly known as: LEVAQUIN Place 1 tablet (750 mg total) into feeding tube daily at 6 PM.   mirtazapine 30 MG disintegrating tablet Commonly known as: REMERON SOL-TAB Take 1 tablet (30 mg total) by mouth at bedtime.   oxyCODONE-acetaminophen 5-325 MG tablet Commonly known as: PERCOCET/ROXICET Take 1 tablet by mouth every 8 (eight) hours as needed.   PARoxetine 20 MG tablet Commonly known as: PAXIL Place 1 tablet (20 mg total) into feeding tube at bedtime. What changed: when to take this   Pen Needles 32G X 6 MM  Misc 1 each by Does not apply route 4 (four) times daily - after meals and at bedtime.   polyethylene glycol 17 g packet Commonly known as: MIRALAX / GLYCOLAX Place 17 g into feeding tube daily.   predniSONE 20 MG tablet Commonly known as: DELTASONE Take 1 tablet (20 mg total) by mouth daily with breakfast. 10 days   QUEtiapine 100 MG tablet Commonly known as: SEROQUEL Place 1 tablet (100 mg total) into feeding tube at bedtime.   senna 8.6 MG Tabs tablet Commonly known as: SENOKOT Place 1-2 tablets into feeding tube at bedtime.       continue with home health services   Signed: Flora Lipps 08/12/2019, 1:02 PM

## 2019-08-12 NOTE — TOC Progression Note (Addendum)
Transition of Care [facility]) - Progression Note    Patient Details  Name: Michelle [name] MRN: [id number] Date of Birth: [birth year]  Transition of [facility]) CM/SW [place], LCSW Phone Number: 08/12/2019, 12:26 PM  Clinical Narrative:   CSW informed Ceasar Mons at [facility] and Flo Shanks at [facility] that the plan is for patient to discharge home today. Spoke with patient's daughter, [name] to confirm address. [name] reported that she could provide transport if patient can sit up in a wheelchair. CSW spoke with patient's RN who said patient cannot do this. CSW informed [name]. CSW will arrange EMS transport home when patient is ready.  1:00- Call from Lisbeth Ply at [facility], confirmed plan for patient to discharge home today.    Expected Discharge Plan: [place] Barriers to Discharge: Continued Medical Work up  Expected Discharge Plan and Services Expected Discharge Plan: [place] arrangements for the past 2 months: Single Family Home                                       Social Determinants of Health (SDOH) Interventions    Readmission Risk Interventions Readmission Risk Prevention Plan 08/07/2019 09/19/2018  Transportation Screening - Complete  PCP or Specialist Appt within 5-7 Days - Complete  Home Care Screening - Complete  Medication Review (RN CM) - Complete  PCP or Specialist appointment within 3-5 days of discharge Complete -  [place] or Home Care Consult Complete -  Palliative Care Screening Complete -  Some recent data might be hidden

## 2019-08-15 ENCOUNTER — Other Ambulatory Visit: Payer: Self-pay | Admitting: Pulmonary Disease

## 2019-08-15 MED ORDER — LEVOFLOXACIN 750 MG PO TABS: 750.0000 mg | ORAL_TABLET | Freq: Every day | ORAL | 0 refills | Status: DC

## 2019-08-15 MED ORDER — PREDNISONE 20 MG PO TABS: 20.0000 mg | ORAL_TABLET | Freq: Every day | ORAL | 1 refills | Status: DC

## 2019-08-15 NOTE — Progress Notes (Signed)
Patient's daughter called, they had not received the antibiotic and prednisone prescription she needed according to Dr. Zoila Shutter discharge summary, this was sent to the [facility] in [place].  Prescription for Levaquin and prednisone as per discharge summary.  Renold Don, MD [place] [facility]

## 2019-08-28 ENCOUNTER — Other Ambulatory Visit: Payer: Self-pay

## 2019-08-28 ENCOUNTER — Emergency Department: Payer: Medicaid Other

## 2019-08-28 ENCOUNTER — Inpatient Hospital Stay
Admission: EM | Admit: 2019-08-28 | Discharge: 2019-09-03 | DRG: 870 | Disposition: A | Discharge status: Home Health | Payer: Medicaid Other | Attending: Internal Medicine | Admitting: Internal Medicine

## 2019-08-28 DIAGNOSIS — J0410 Acute tracheitis without obstruction: Secondary | ICD-10-CM | POA: Diagnosis present

## 2019-08-28 DIAGNOSIS — Z825 Family history of asthma and other chronic lower respiratory diseases: Secondary | ICD-10-CM

## 2019-08-28 DIAGNOSIS — Y95 Nosocomial condition: Secondary | ICD-10-CM | POA: Diagnosis present

## 2019-08-28 DIAGNOSIS — Z7951 Long term (current) use of inhaled steroids: Secondary | ICD-10-CM | POA: Diagnosis not present

## 2019-08-28 DIAGNOSIS — Z823 Family history of stroke: Secondary | ICD-10-CM

## 2019-08-28 DIAGNOSIS — Z931 Gastrostomy status: Secondary | ICD-10-CM | POA: Diagnosis not present

## 2019-08-28 DIAGNOSIS — A419 Sepsis, unspecified organism: Secondary | ICD-10-CM | POA: Diagnosis present

## 2019-08-28 DIAGNOSIS — Z66 Do not resuscitate: Secondary | ICD-10-CM | POA: Diagnosis present

## 2019-08-28 DIAGNOSIS — Z930 Tracheostomy status: Secondary | ICD-10-CM

## 2019-08-28 DIAGNOSIS — I2510 Atherosclerotic heart disease of native coronary artery without angina pectoris: Secondary | ICD-10-CM | POA: Diagnosis present

## 2019-08-28 DIAGNOSIS — J151 Pneumonia due to Pseudomonas: Secondary | ICD-10-CM | POA: Diagnosis present

## 2019-08-28 DIAGNOSIS — I10 Essential (primary) hypertension: Secondary | ICD-10-CM | POA: Diagnosis present

## 2019-08-28 DIAGNOSIS — Z8249 Family history of ischemic heart disease and other diseases of the circulatory system: Secondary | ICD-10-CM | POA: Diagnosis not present

## 2019-08-28 DIAGNOSIS — E876 Hypokalemia: Secondary | ICD-10-CM | POA: Diagnosis present

## 2019-08-28 DIAGNOSIS — E440 Moderate protein-calorie malnutrition: Secondary | ICD-10-CM | POA: Diagnosis present

## 2019-08-28 DIAGNOSIS — B965 Pseudomonas (aeruginosa) (mallei) (pseudomallei) as the cause of diseases classified elsewhere: Secondary | ICD-10-CM | POA: Diagnosis present

## 2019-08-28 DIAGNOSIS — E785 Hyperlipidemia, unspecified: Secondary | ICD-10-CM | POA: Diagnosis present

## 2019-08-28 DIAGNOSIS — J9621 Acute and chronic respiratory failure with hypoxia: Secondary | ICD-10-CM | POA: Diagnosis present

## 2019-08-28 DIAGNOSIS — E7800 Pure hypercholesterolemia, unspecified: Secondary | ICD-10-CM | POA: Diagnosis present

## 2019-08-28 DIAGNOSIS — Z79899 Other long term (current) drug therapy: Secondary | ICD-10-CM | POA: Diagnosis not present

## 2019-08-28 DIAGNOSIS — A4152 Sepsis due to Pseudomonas: Principal | ICD-10-CM | POA: Diagnosis present

## 2019-08-28 DIAGNOSIS — E119 Type 2 diabetes mellitus without complications: Secondary | ICD-10-CM

## 2019-08-28 DIAGNOSIS — Z7982 Long term (current) use of aspirin: Secondary | ICD-10-CM | POA: Diagnosis not present

## 2019-08-28 DIAGNOSIS — J988 Other specified respiratory disorders: Secondary | ICD-10-CM | POA: Diagnosis present

## 2019-08-28 DIAGNOSIS — Z888 Allergy status to other drugs, medicaments and biological substances status: Secondary | ICD-10-CM

## 2019-08-28 DIAGNOSIS — I110 Hypertensive heart disease with heart failure: Secondary | ICD-10-CM | POA: Diagnosis present

## 2019-08-28 DIAGNOSIS — I5032 Chronic diastolic (congestive) heart failure: Secondary | ICD-10-CM | POA: Diagnosis present

## 2019-08-28 DIAGNOSIS — Z803 Family history of malignant neoplasm of breast: Secondary | ICD-10-CM | POA: Diagnosis not present

## 2019-08-28 DIAGNOSIS — Z794 Long term (current) use of insulin: Secondary | ICD-10-CM | POA: Diagnosis not present

## 2019-08-28 DIAGNOSIS — E1169 Type 2 diabetes mellitus with other specified complication: Secondary | ICD-10-CM | POA: Diagnosis present

## 2019-08-28 DIAGNOSIS — J189 Pneumonia, unspecified organism: Secondary | ICD-10-CM | POA: Diagnosis present

## 2019-08-28 DIAGNOSIS — Z720 Tobacco use: Secondary | ICD-10-CM | POA: Diagnosis present

## 2019-08-28 DIAGNOSIS — Z6833 Body mass index (BMI) 33.0-33.9, adult: Secondary | ICD-10-CM

## 2019-08-28 DIAGNOSIS — Z801 Family history of malignant neoplasm of trachea, bronchus and lung: Secondary | ICD-10-CM

## 2019-08-28 DIAGNOSIS — J439 Emphysema, unspecified: Secondary | ICD-10-CM | POA: Diagnosis present

## 2019-08-28 DIAGNOSIS — F419 Anxiety disorder, unspecified: Secondary | ICD-10-CM | POA: Diagnosis present

## 2019-08-28 DIAGNOSIS — J95851 Ventilator associated pneumonia: Secondary | ICD-10-CM

## 2019-08-28 DIAGNOSIS — Z20822 Contact with and (suspected) exposure to covid-19: Secondary | ICD-10-CM | POA: Diagnosis present

## 2019-08-28 DIAGNOSIS — Z880 Allergy status to penicillin: Secondary | ICD-10-CM

## 2019-08-28 DIAGNOSIS — Z87891 Personal history of nicotine dependence: Secondary | ICD-10-CM

## 2019-08-28 LAB — COMPREHENSIVE METABOLIC PANEL
ALT: 15 U/L (ref 0–44)
AST: 15 U/L (ref 15–41)
Albumin: 4 g/dL (ref 3.5–5.0)
Alkaline Phosphatase: 101 U/L (ref 38–126)
Anion gap: 11 (ref 5–15)
BUN: 20 mg/dL (ref 6–20)
CO2: 23 mmol/L (ref 22–32)
Calcium: 9 mg/dL (ref 8.9–10.3)
Chloride: 108 mmol/L (ref 98–111)
Creatinine, Ser: 0.71 mg/dL (ref 0.44–1.00)
GFR calc Af Amer: >60 mL/min (ref >60)
GFR calc non Af Amer: >60 mL/min (ref >60)
Glucose, Bld: 120 mg/dL — ABNORMAL HIGH (ref 70–99)
Potassium: 3.4 mmol/L — ABNORMAL LOW (ref 3.5–5.1)
Sodium: 142 mmol/L (ref 135–145)
Total Bilirubin: 0.8 mg/dL (ref 0.3–1.2)
Total Protein: 7.8 g/dL (ref 6.5–8.1)

## 2019-08-28 LAB — CBC WITH DIFFERENTIAL/PLATELET
Abs Immature Granulocytes: 0.07 10*3/uL (ref 0.00–0.07)
Basophils Absolute: 0.1 10*3/uL (ref 0.0–0.1)
Basophils Relative: 1 %
Eosinophils Absolute: 0.3 10*3/uL (ref 0.0–0.5)
Eosinophils Relative: 2 %
HCT: 43.8 % (ref 36.0–46.0)
Hemoglobin: 13.8 g/dL (ref 12.0–15.0)
Immature Granulocytes: 1 %
Lymphocytes Relative: 13 %
Lymphs Abs: 1.5 10*3/uL (ref 0.7–4.0)
MCH: 26.1 pg (ref 26.0–34.0)
MCHC: 31.5 g/dL (ref 30.0–36.0)
MCV: 82.8 fL (ref 80.0–100.0)
Monocytes Absolute: 1 10*3/uL (ref 0.1–1.0)
Monocytes Relative: 9 %
Neutro Abs: 9.1 10*3/uL — ABNORMAL HIGH (ref 1.7–7.7)
Neutrophils Relative %: 74 %
Platelets: 319 10*3/uL (ref 150–400)
RBC: 5.29 MIL/uL — ABNORMAL HIGH (ref 3.87–5.11)
RDW: 16 % — ABNORMAL HIGH (ref 11.5–15.5)
WBC: 12 10*3/uL — ABNORMAL HIGH (ref 4.0–10.5)
nRBC: 0 % (ref 0.0–0.2)

## 2019-08-28 LAB — URINALYSIS, ROUTINE W REFLEX MICROSCOPIC
Bacteria, UA: NONE SEEN
Bilirubin Urine: NEGATIVE
Glucose, UA: NEGATIVE mg/dL
Hgb urine dipstick: NEGATIVE
Ketones, ur: NEGATIVE mg/dL
Leukocytes,Ua: NEGATIVE
Nitrite: NEGATIVE
Protein, ur: 30 mg/dL — AB
Specific Gravity, Urine: >1.046 — ABNORMAL HIGH (ref 1.005–1.030)
pH: 6 (ref 5.0–8.0)

## 2019-08-28 LAB — PROTIME-INR
INR: 1 (ref 0.8–1.2)
Prothrombin Time: 12.6 seconds (ref 11.4–15.2)

## 2019-08-28 LAB — APTT: aPTT: 28 seconds (ref 24–36)

## 2019-08-28 LAB — LACTIC ACID, PLASMA: Lactic Acid, Venous: 1.5 mmol/L (ref 0.5–1.9)

## 2019-08-28 LAB — RESPIRATORY PANEL BY RT PCR (FLU A&B, COVID)
Influenza A by PCR: NEGATIVE
Influenza B by PCR: NEGATIVE
SARS Coronavirus 2 by RT PCR: NEGATIVE

## 2019-08-28 LAB — GLUCOSE, CAPILLARY: Glucose-Capillary: 128 mg/dL — ABNORMAL HIGH (ref 70–99)

## 2019-08-28 MED ORDER — ORAL CARE MOUTH RINSE
15.0000 mL | OROMUCOSAL | Status: DC
  Administered 2019-08-29 – 2019-09-03 (×20): 15 mL via OROMUCOSAL
  Filled 2019-08-28 (×10): qty 15

## 2019-08-28 MED ORDER — VANCOMYCIN HCL IN DEXTROSE 1-5 GM/200ML-% IV SOLN
1000.0000 mg | Freq: Once | INTRAVENOUS | Status: AC
  Administered 2019-08-28: 1000 mg via INTRAVENOUS
  Filled 2019-08-28: qty 200

## 2019-08-28 MED ORDER — SODIUM CHLORIDE 0.9 % IV SOLN
INTRAVENOUS | Status: DC
  Administered 2019-09-02: 100 mL/h via INTRAVENOUS

## 2019-08-28 MED ORDER — CHLORHEXIDINE GLUCONATE 0.12 % MT SOLN
15.0000 mL | Freq: Two times a day (BID) | OROMUCOSAL | Status: DC
  Administered 2019-08-31 – 2019-09-03 (×4): 15 mL via OROMUCOSAL
  Filled 2019-08-28 (×4): qty 15

## 2019-08-28 MED ORDER — SODIUM CHLORIDE 0.9 % IV BOLUS
1000.0000 mL | Freq: Once | INTRAVENOUS | Status: AC
  Administered 2019-08-28: 1000 mL via INTRAVENOUS

## 2019-08-28 MED ORDER — INSULIN ASPART 100 UNIT/ML ~~LOC~~ SOLN
0.0000 [IU] | Freq: Three times a day (TID) | SUBCUTANEOUS | Status: DC
  Administered 2019-08-29 (×2): 2 [IU] via SUBCUTANEOUS
  Administered 2019-08-30 – 2019-08-31 (×5): 3 [IU] via SUBCUTANEOUS
  Administered 2019-08-31 – 2019-09-01 (×3): 5 [IU] via SUBCUTANEOUS
  Administered 2019-09-02 (×2): 2 [IU] via SUBCUTANEOUS
  Filled 2019-08-28 (×12): qty 1

## 2019-08-28 MED ORDER — SODIUM CHLORIDE 0.9 % IV SOLN
2.0000 g | Freq: Once | INTRAVENOUS | Status: AC
  Administered 2019-08-28: 2 g via INTRAVENOUS
  Filled 2019-08-28: qty 2

## 2019-08-28 MED ORDER — INSULIN ASPART 100 UNIT/ML ~~LOC~~ SOLN
0.0000 [IU] | Freq: Every day | SUBCUTANEOUS | Status: DC
  Administered 2019-08-30: 3 [IU] via SUBCUTANEOUS
  Filled 2019-08-28 (×3): qty 1

## 2019-08-28 MED ORDER — ONDANSETRON HCL 4 MG/2ML IJ SOLN
4.0000 mg | Freq: Once | INTRAMUSCULAR | Status: AC
  Administered 2019-08-28: 4 mg via INTRAVENOUS
  Filled 2019-08-28: qty 2

## 2019-08-28 MED ORDER — IPRATROPIUM-ALBUTEROL 0.5-2.5 (3) MG/3ML IN SOLN
3.0000 mL | Freq: Once | RESPIRATORY_TRACT | Status: AC
  Administered 2019-08-28: 3 mL via RESPIRATORY_TRACT
  Filled 2019-08-28: qty 3

## 2019-08-28 MED ORDER — MORPHINE SULFATE (PF) 4 MG/ML IV SOLN
4.0000 mg | Freq: Once | INTRAVENOUS | Status: AC
  Administered 2019-08-28: 4 mg via INTRAVENOUS
  Filled 2019-08-28: qty 1

## 2019-08-28 MED ORDER — ENOXAPARIN SODIUM 40 MG/0.4ML ~~LOC~~ SOLN
40.0000 mg | SUBCUTANEOUS | Status: DC
  Administered 2019-08-29 – 2019-09-02 (×6): 40 mg via SUBCUTANEOUS
  Filled 2019-08-28 (×6): qty 0.4

## 2019-08-28 MED ORDER — IOHEXOL 300 MG/ML  SOLN
100.0000 mL | Freq: Once | INTRAMUSCULAR | Status: AC | PRN
  Administered 2019-08-28: 100 mL via INTRAVENOUS

## 2019-08-28 MED ORDER — SODIUM CHLORIDE 0.9 % IV SOLN
2.0000 g | Freq: Three times a day (TID) | INTRAVENOUS | Status: DC
  Administered 2019-08-29 – 2019-09-02 (×13): 2 g via INTRAVENOUS
  Filled 2019-08-28 (×16): qty 2

## 2019-08-28 NOTE — ED Notes (Signed)
Pt states she still doesn't think she can urinate for sample. Agrees to I&O cath.

## 2019-08-28 NOTE — Progress Notes (Signed)
CODE SEPSIS - PHARMACY COMMUNICATION  **Broad Spectrum Antibiotics should be administered within 1 hour of Sepsis diagnosis**  Time Code Sepsis Called/Page Received: 3/26 1712  Antibiotics Ordered: cefepime, vanc  Time of 1st antibiotic administration: [id number]  Additional action taken by pharmacy:    If necessary, Name of Provider/Nurse Contacted:      Noralee Space ,PharmD Clinical Pharmacist  08/28/2019  7:22 PM

## 2019-08-28 NOTE — ED Notes (Signed)
RT called as pt stated she usually has significant drop in O2 when she unattaches tubing to at home to complete neb treatments. Pt remains on her home equipment. Kristy RT states she will drop by as soon as she can.

## 2019-08-28 NOTE — ED Provider Notes (Addendum)
[facility] Emergency Department Provider Note  ____________________________________________   First MD Initiated Contact with Patient 08/28/19 1624     (approximate)  I have reviewed the triage vital signs and the nursing notes.   HISTORY  Chief Complaint Pneumonia    HPI Michelle [name] is a 59 y.o. female  With PMHx CHF, emphysema, chronic resp failure s/p trach placement, COPD, here with cough, abd pain. Pt reports that over the last 2-3 days, her cough has worsened and her trach secretions have gotten increasingly thick. She was just in the hospital for PNA recently and states she never truly improved. She has also had worsening aching, gnawing, left-sided abd pain that is worse w/ coughing. No alleviating factors. No leg swelling. No blood in secretions. No specific alleviating factors. Pain is worse w/ palpation. Her appetite has been normal, no constipation or vomiting.        Past Medical History:  Diagnosis Date  . Allergy   . Anxiety   . Asthma   . CHF (congestive heart failure) ([place])   . Cocaine abuse ([place])   . COPD (chronic obstructive pulmonary disease) ([place])   . Coronary artery disease   . Emphysema/COPD ([place])   . Hepatitis C   . High cholesterol   . Hypertension   . Pneumothorax   . Tobacco abuse     Patient Active Problem List   Diagnosis Date Noted  . Atypical pneumonia 08/29/2019  . HCAP (healthcare-associated pneumonia) 08/28/2019  . Pseudomonas respiratory infection 07/03/2019  . Depression with anxiety 07/03/2019  . Hyperlipidemia associated with type 2 diabetes mellitus ([place]) 07/03/2019  . PEG (percutaneous endoscopic gastrostomy) status ([place]) 07/03/2019  . Chronic pain 07/03/2019  . Hypokalemia 07/03/2019  . DM (diabetes mellitus) ([place]) 06/24/2019  . Chronic respiratory failure requiring continuous mechanical ventilation through tracheostomy ([place]) 10/20/2018  . CAD (coronary artery disease) 10/20/2018  . HTN  (hypertension) 10/20/2018  . Chronic diastolic CHF (congestive heart failure) ([place]) 10/20/2018  . Pneumonia 09/17/2018  . Panic disorder 10/29/2017  . Altered mental status   . Pressure injury of skin 10/23/2017  . Acute on chronic respiratory failure with hypoxia and hypercapnia ([place]) 06/29/2017  . Acute on chronic respiratory failure with hypoxia ([place]) 02/23/2017  . Protein-calorie malnutrition, severe 05/23/2016  . Sepsis ([place]) 05/22/2016  . Malnutrition of moderate degree ([place]) 10/25/2014  . COPD exacerbation ([place]) 10/24/2014  . Tobacco abuse 10/24/2014  . Community acquired pneumonia 10/24/2014    Past Surgical History:  Procedure Laterality Date  . CARDIAC CATHETERIZATION    . CHEST TUBE INSERTION    . IR GASTROSTOMY TUBE MOD SED  10/31/2017  . TRACHEOSTOMY TUBE PLACEMENT N/A 10/17/2017   Procedure: TRACHEOSTOMY;  Surgeon: Carloyn Manner, MD;  Location: [facility] ORS;  Service: ENT;  Laterality: N/A;    Prior to Admission medications   Medication Sig Start Date End Date Taking? Authorizing Provider  aspirin 81 MG chewable tablet Chew 81 mg by mouth daily.    Yes [provider]  budesonide (PULMICORT) 0.25 MG/2ML nebulizer solution Take 2 mLs (0.25 mg total) by nebulization 2 (two) times daily. Patient taking differently: Take 0.5 mg by nebulization 2 (two) times daily.  12/31/17  Yes Conforti, John, DO  cetirizine (ZYRTEC) 10 MG tablet Place 10 mg into feeding tube daily as needed.    Yes [provider]  clonazePAM (KLONOPIN) 0.5 MG tablet Take 3 tablets (1.5 mg total) by mouth 3 (three) times daily. Patient taking differently: Take  1.5 mg by mouth 3 (three) times daily.  07/03/19  Yes Nicole Kindred A, DO  cloNIDine (CATAPRES - DOSED IN MG/24 HR) 0.1 mg/24hr patch Place 0.1 mg onto the skin once a week.   Yes [provider]  famotidine (PEPCID) 20 MG tablet Place 1 tablet (20 mg total) into feeding tube daily. 12/31/17  Yes Conforti, John, DO    fluticasone (FLONASE) 50 MCG/ACT nasal spray Place 1 spray into both nostrils daily as needed.    Yes [provider]  guaiFENesin (ROBITUSSIN) 100 MG/5ML SOLN Take 5 mLs (100 mg total) by mouth every 4 (four) hours as needed for cough or to loosen phlegm. 08/12/19  Yes Kasa, Maretta Bees, MD  guaiFENesin-dextromethorphan (ROBITUSSIN DM) 100-10 MG/5ML syrup Take 10 mLs by mouth every 6 (six) hours as needed for cough. Patient taking differently: Take 5 mLs by mouth every 6 (six) hours as needed for cough.  07/03/19  Yes Nicole Kindred A, DO  insulin aspart (NOVOLOG) 100 UNIT/ML injection Inject 0-20 Units into the skin 3 (three) times daily with meals. 07/05/19 08/28/19 Yes Nicole Kindred A, DO  insulin detemir (LEVEMIR) 100 UNIT/ML injection Inject 0.25 mLs (25 Units total) into the skin 2 (two) times daily. 07/05/19 08/28/19 Yes Nicole Kindred A, DO  ipratropium-albuterol (DUONEB) 0.5-2.5 (3) MG/3ML SOLN Take 3 mLs by nebulization every 6 (six) hours. Patient taking differently: Take 3 mLs by nebulization every 6 (six) hours as needed (for shortness of breath).  12/31/17  Yes Conforti, John, DO  mirtazapine (REMERON SOL-TAB) 30 MG disintegrating tablet Take 1 tablet (30 mg total) by mouth at bedtime. 07/05/19  Yes Nicole Kindred A, DO  oxyCODONE-acetaminophen (PERCOCET/ROXICET) 5-325 MG tablet Take 1 tablet by mouth every 8 (eight) hours as needed.   Yes [provider]  PARoxetine (PAXIL) 20 MG tablet Place 1 tablet (20 mg total) into feeding tube at bedtime. Patient taking differently: Place 20 mg into feeding tube daily.  12/31/17  Yes Conforti, John, DO  polyethylene glycol (MIRALAX / GLYCOLAX) 17 g packet Place 17 g into feeding tube daily as needed.    Yes [provider]  QUEtiapine (SEROQUEL) 100 MG tablet Place 1 tablet (100 mg total) into feeding tube at bedtime. 07/03/19  Yes Ezekiel Slocumb, DO  senna (SENOKOT) 8.6 MG TABS tablet Place 1-2 tablets into feeding tube at  bedtime as needed for mild constipation.    Yes [provider]  blood glucose meter kit and supplies KIT Dispense based on patient and insurance preference. Use up to four times daily as directed. (FOR ICD-9 250.00, 250.01). 07/05/19   Ezekiel Slocumb, DO  Ensure Max Protein (ENSURE MAX PROTEIN) LIQD Take 330 mLs (11 oz total) by mouth 2 (two) times daily. 07/03/19   Nicole Kindred A, DO  ferrous sulfate 325 (65 FE) MG tablet Take 325 mg by mouth every other day.    [provider]  Insulin Pen Needle (PEN NEEDLES) 32G X 6 MM MISC 1 each by Does not apply route 4 (four) times daily - after meals and at bedtime. 07/05/19   Ezekiel Slocumb, DO  levofloxacin (LEVAQUIN) 750 MG tablet Place 1 tablet (750 mg total) into feeding tube daily at 6 PM. Patient not taking: Reported on 08/28/2019 08/15/19   Tyler Pita, MD  predniSONE (DELTASONE) 20 MG tablet Take 1 tablet (20 mg total) by mouth daily with breakfast. 10 days Patient not taking: Reported on 08/28/2019 08/15/19   Tyler Pita, MD  Allergies Amoxicillin and Ativan [lorazepam]  Family History  Problem Relation Age of Onset  . Lung cancer Mother   . Asthma Mother   . CVA Father   . CAD Father   . Stroke Father   . Breast cancer Maternal Aunt 67  . COPD Sister     Social History Social History   Tobacco Use  . Smoking status: Former Smoker    Packs/day: 0.10    Years: 41.00    Pack years: 4.10    Types: Cigarettes    Quit date: 06/09/2017    Years since quitting: 2.2  . Smokeless tobacco: Never Used  Substance Use Topics  . Alcohol use: No  . Drug use: Not Currently    Types: "Crack" cocaine    Comment: Past cocaine use.  Quit 04/2017    Review of Systems  Review of Systems  Constitutional: Positive for fatigue. Negative for fever.  HENT: Negative for congestion and sore throat.   Eyes: Negative for visual disturbance.  Respiratory: Positive for cough and shortness of breath.     Cardiovascular: Negative for chest pain.  Gastrointestinal: Positive for abdominal pain and nausea. Negative for diarrhea and vomiting.  Genitourinary: Negative for flank pain.  Musculoskeletal: Negative for back pain and neck pain.  Skin: Negative for rash and wound.  Neurological: Positive for weakness.  All other systems reviewed and are negative.    ____________________________________________  PHYSICAL EXAM:      VITAL SIGNS: ED Triage Vitals  Enc Vitals Group     BP 08/28/19 1619 107/79     Pulse Rate 08/28/19 1619 (!) 102     Resp 08/28/19 1619 20     Temp 08/28/19 1619 99.2 F (37.3 C)     Temp Source 08/28/19 1619 Oral     SpO2 08/28/19 1616 98 %     Weight 08/28/19 1620 160 lb (72.6 kg)     Height 08/28/19 1620 5' 3"  (1.6 m)     Head Circumference --      Peak Flow --      Pain Score 08/28/19 1620 8     Pain Loc --      Pain Edu? --      Excl. in [place]? --      Physical Exam Vitals and nursing note reviewed.  Constitutional:      General: She is not in acute distress.    Appearance: She is well-developed.  HENT:     Head: Normocephalic and atraumatic.  Eyes:     Conjunctiva/sclera: Conjunctivae normal.  Neck:     Comments: Trach c/d/i but with thick, white secretions noted Cardiovascular:     Rate and Rhythm: Normal rate and regular rhythm.     Heart sounds: Normal heart sounds.  Pulmonary:     Effort: Tachypnea present. No respiratory distress.     Breath sounds: Examination of the right-middle field reveals rhonchi. Examination of the left-middle field reveals rhonchi. Examination of the right-lower field reveals rhonchi. Examination of the left-lower field reveals rhonchi. Decreased breath sounds and rhonchi present. No wheezing.  Abdominal:     General: There is no distension.     Tenderness: There is generalized abdominal tenderness and tenderness in the periumbilical area.  Musculoskeletal:     Cervical back: Neck supple.  Skin:    General: Skin  is warm.     Capillary Refill: Capillary refill takes less than 2 seconds.     Findings: No rash.  Neurological:  Mental Status: She is alert and oriented to person, place, and time.     Motor: No abnormal muscle tone.       ____________________________________________   LABS (all labs ordered are listed, but only abnormal results are displayed)  Labs Reviewed  COMPREHENSIVE METABOLIC PANEL - Abnormal; Notable for the following components:      Result Value   Potassium 3.4 (*)    Glucose, Bld 120 (*)    All other components within normal limits  CBC WITH DIFFERENTIAL/PLATELET - Abnormal; Notable for the following components:   WBC 12.0 (*)    RBC 5.29 (*)    RDW 16.0 (*)    Neutro Abs 9.1 (*)    All other components within normal limits  URINALYSIS, ROUTINE W REFLEX MICROSCOPIC - Abnormal; Notable for the following components:   Color, Urine YELLOW (*)    APPearance CLEAR (*)    Specific Gravity, Urine >1.046 (*)    Protein, ur 30 (*)    All other components within normal limits  CBC - Abnormal; Notable for the following components:   Hemoglobin 11.6 (*)    RDW 16.0 (*)    All other components within normal limits  COMPREHENSIVE METABOLIC PANEL - Abnormal; Notable for the following components:   Potassium 3.3 (*)    CO2 21 (*)    Glucose, Bld 137 (*)    BUN 21 (*)    Calcium 8.2 (*)    Albumin 3.3 (*)    AST 14 (*)    All other components within normal limits  GLUCOSE, CAPILLARY - Abnormal; Notable for the following components:   Glucose-Capillary 128 (*)    All other components within normal limits  GLUCOSE, CAPILLARY - Abnormal; Notable for the following components:   Glucose-Capillary 136 (*)    All other components within normal limits  CULTURE, BLOOD (ROUTINE X 2)  CULTURE, BLOOD (ROUTINE X 2)  RESPIRATORY PANEL BY RT PCR (FLU A&B, COVID)  MRSA PCR SCREENING  URINE CULTURE  CULTURE, RESPIRATORY  CULTURE, BLOOD (ROUTINE X 2)  CULTURE, BLOOD (ROUTINE X  2)  EXPECTORATED SPUTUM ASSESSMENT W REFEX TO RESP CULTURE  LACTIC ACID, PLASMA  APTT  PROTIME-INR  STREP PNEUMONIAE URINARY ANTIGEN  PREGNANCY, URINE  HEMOGLOBIN A1C  HIV ANTIBODY (ROUTINE TESTING W REFLEX)  CBC  POC URINE PREG, ED    ____________________________________________  EKG: Normal sinus rhythm, VR 93. QRS 131, QTc 493. RBBB and LPFB.  ________________________________________  RADIOLOGY All imaging, including plain films, CT scans, and ultrasounds, independently reviewed by me, and interpretations confirmed via formal radiology reads.  ED MD interpretation:   CXR: No apparent PNA CT A/P: Slight biliary dilation, otherwise no acute abnormality  Official radiology report(s): CT ABDOMEN PELVIS W CONTRAST  Result Date: 08/28/2019 CLINICAL DATA:  Nausea and vomiting. Left-sided flank and abdominal pain. EXAM: CT ABDOMEN AND PELVIS WITH CONTRAST TECHNIQUE: Multidetector CT imaging of the abdomen and pelvis was performed using the standard protocol following bolus administration of intravenous contrast. CONTRAST:  180m OMNIPAQUE IOHEXOL 300 MG/ML  SOLN COMPARISON:  Abdominal radiograph 08/07/2019 FINDINGS: Lower chest: Emphysema. Linear opacity in the right lower lobe favors atelectasis. Heart is normal in size. Hepatobiliary: Decreased hepatic density consistent with steatosis. More focal fatty infiltration adjacent to the falciform ligament. No discrete focal hepatic lesion. Gallbladder physiologically distended. No calcified gallstone or pericholecystic inflammation. There is mild extrahepatic biliary ductal dilatation with common bile duct measuring 8 mm distally. Pancreas: No acute peripancreatic inflammation. There is proximal  pancreatic ductal dilatation at 6 mm. No evidence of pancreatic cyst or focal lesion. Spleen: Normal in size without focal abnormality. Adrenals/Urinary Tract: No adrenal nodule. No hydronephrosis or perinephric edema. Homogeneous renal enhancement with  symmetric excretion on delayed phase imaging. 16 mm cyst in the mid right kidney. Additional tiny hypodensity in the lower right kidney is incompletely characterized due to size. Urinary bladder is physiologically distended without wall thickening. Stomach/Bowel: Gastrostomy tube appropriately positioned in the stomach. No abnormality along the gastrostomy tube tract. Stomach is decompressed and unremarkable. No small bowel inflammation or obstruction. Normal appendix. Small to moderate colonic stool burden. No colonic wall thickening or inflammation. No significant diverticular disease. Vascular/Lymphatic: Aorto bi-iliac atherosclerosis, moderate but advanced for age. No aortic aneurysm. The portal vein is patent. No acute vascular findings. No abdominopelvic adenopathy. Reproductive: Bilateral tubal ligation clips. Uterus unremarkable. Ovaries quiescent. No adnexal mass. Other: No free air, free fluid, or intra-abdominal fluid collection. Small fat containing umbilical hernia. Soft tissue densities in the lower anterior abdominal wall typical of medication injection sites. Musculoskeletal: There are no acute or suspicious osseous abnormalities. Degenerative disc disease at L5-S1. IMPRESSION: 1. Mild extrahepatic biliary ductal dilatation and proximal pancreatic ductal dilatation, of uncertain etiology and chronicity. No significant gallbladder dilatation or evidence of focal pancreatic or obstructing lesion. Recommend correlation with LFTs. If elevated, recommend further evaluation with MRCP or ERCP. MRCP should only be considered if patient is able to tolerate breath hold technique, and could be performed on an elective outpatient basis. 2. No additional acute abnormality in the abdomen/pelvis. 3. Hepatic steatosis. 4. Aorto bi-iliac atherosclerosis, age advanced. 5. Gastrostomy tube appropriately positioned in the stomach. Aortic Atherosclerosis (ICD10-I70.0). Electronically Signed   By: Keith Rake M.D.    On: 08/28/2019 19:20   DG Chest Portable 1 View  Result Date: 08/28/2019 CLINICAL DATA:  Difficulty breathing. EXAM: PORTABLE CHEST 1 VIEW COMPARISON:  August 06, 2019 FINDINGS: There is stable tracheostomy tube positioning. The lungs are hyperinflated. There is no evidence of acute infiltrate, pleural effusion or pneumothorax. The heart size and mediastinal contours are within normal limits. The visualized skeletal structures are unremarkable. IMPRESSION: No active disease. Electronically Signed   By: Virgina Norfolk M.D.   On: 08/28/2019 17:31    ____________________________________________  PROCEDURES   Procedure(s) performed (including Critical Care):  Procedures  ____________________________________________  INITIAL IMPRESSION / MDM / [place] / ED COURSE  As part of my medical decision making, I reviewed the following data within the [facility] notes reviewed and incorporated, Old chart reviewed, Notes from prior ED visits, and [place] Controlled Substance Database       *MEEGHAN SKIPPER was evaluated in Emergency Department on 08/29/2019 for the symptoms described in the history of present illness. She was evaluated in the context of the global COVID-19 pandemic, which necessitated consideration that the patient might be at risk for infection with the SARS-CoV-2 virus that causes COVID-19. Institutional protocols and algorithms that pertain to the evaluation of patients at risk for COVID-19 are in a state of rapid change based on information released by regulatory bodies including the CDC and federal and state organizations. These policies and algorithms were followed during the patient's care in the ED.  Some ED evaluations and interventions may be delayed as a result of limited staffing during the pandemic.*     Medical Decision Making:  59 yo F here with reported fever at home, increased trach secretions, and SOB as well as  left-sided chest and abd  pain. Suspect her sx are 2/2 early VAP vs bacterial tracheitis, and she has an extensive h/o same with MDR-PSA. Will start on empiric ABX and admit. Regarding her abd pain - CT scan neg. I suspect this could be referred from possible L-sided PNA vs chest wall pain from coughing. No leg swelling or signs of DVT/PE clinically.   ____________________________________________  FINAL CLINICAL IMPRESSION(S) / ED DIAGNOSES  Final diagnoses:  Ventilator associated pneumonia ([place])  Tracheitis     MEDICATIONS GIVEN DURING THIS VISIT:  Medications  0.9 %  sodium chloride infusion ( Intravenous New Bag/Given 08/29/19 0307)  insulin aspart (novoLOG) injection 0-15 Units (has no administration in time range)  insulin aspart (novoLOG) injection 0-5 Units (0 Units Subcutaneous Not Given 08/29/19 0227)  enoxaparin (LOVENOX) injection 40 mg (40 mg Subcutaneous Given 08/29/19 0219)  ceFEPIme (MAXIPIME) 2 g in sodium chloride 0.9 % 100 mL IVPB (0 g Intravenous Stopped 08/29/19 0257)  chlorhexidine (PERIDEX) 0.12 % solution 15 mL (15 mLs Mouth Rinse Not Given 08/29/19 0301)  MEDLINE mouth rinse (15 mLs Mouth Rinse Not Given 08/29/19 0437)  mirtazapine (REMERON SOL-TAB) disintegrating tablet 30 mg (has no administration in time range)  QUEtiapine (SEROQUEL) tablet 100 mg (has no administration in time range)  senna (SENOKOT) tablet 8.6-17.2 mg (has no administration in time range)  clonazePAM (KLONOPIN) tablet 1.5 mg (has no administration in time range)  budesonide (PULMICORT) nebulizer solution 0.5 mg (has no administration in time range)  famotidine (PEPCID) tablet 20 mg (has no administration in time range)  ipratropium-albuterol (DUONEB) 0.5-2.5 (3) MG/3ML nebulizer solution 3 mL (has no administration in time range)  ipratropium-albuterol (DUONEB) 0.5-2.5 (3) MG/3ML nebulizer solution 3 mL (has no administration in time range)  vancomycin (VANCOCIN) IVPB 1000 mg/200 mL premix (0 mg Intravenous Stopped 08/28/19  2025)  ceFEPIme (MAXIPIME) 2 g in sodium chloride 0.9 % 100 mL IVPB (0 g Intravenous Stopped 08/28/19 1824)  sodium chloride 0.9 % bolus 1,000 mL (0 mLs Intravenous Stopped 08/28/19 2000)  ipratropium-albuterol (DUONEB) 0.5-2.5 (3) MG/3ML nebulizer solution 3 mL (3 mLs Nebulization Given 08/28/19 1911)  morphine 4 MG/ML injection 4 mg (4 mg Intravenous Given 08/28/19 1740)  ondansetron (ZOFRAN) injection 4 mg (4 mg Intravenous Given 08/28/19 1738)  iohexol (OMNIPAQUE) 300 MG/ML solution 100 mL (100 mLs Intravenous Contrast Given 08/28/19 1833)     ED Discharge Orders    None       Note:  This document was prepared using Dragon voice recognition software and may include unintentional dictation errors.   Duffy Bruce, MD 08/29/19 Leane Platt    Duffy Bruce, MD 08/29/19 [phone]

## 2019-08-28 NOTE — Progress Notes (Signed)
PHARMACY -  BRIEF ANTIBIOTIC NOTE   Pharmacy has received consult(s) for [place]and Cefepime from an ED provider.  The patient's profile has been reviewed for ht/wt/allergies/indication/available labs.    One time order(s) placed by MD for Cefepime 2 gm and Vancomycin 1 gram  Further antibiotics/pharmacy consults should be ordered by admitting physician if indicated.                       Thank you, Saralyn Willison A 08/28/2019  5:17 PM

## 2019-08-28 NOTE — ED Notes (Signed)
Pt given snacks and drink. Denies any other needs. Bed locked low. Rail up. Call bell within reach.

## 2019-08-28 NOTE — ED Notes (Addendum)
G tube site C/D/I. Pt given tv remote. Room temp adjusted for pt. HOB adjusted for pt. Call bell within reach. Rails up. Bed locked low. Pt suctioned by Marquita Palms. RT at bedside. EKG completed.

## 2019-08-28 NOTE — ED Notes (Signed)
Pt inline suctioned x1 with return approx 20ml bloody purulent drainage.

## 2019-08-28 NOTE — ED Notes (Addendum)
Attempted for 24g at L ac medially for cultures.

## 2019-08-28 NOTE — ED Notes (Signed)
IV pump and channel found.

## 2019-08-28 NOTE — ED Notes (Signed)
Another IV placed by EDP Isaacs using [place].

## 2019-08-28 NOTE — ED Notes (Signed)
Pt's wrist band won't scan properly. Double checked all rights before giving meds.

## 2019-08-28 NOTE — Progress Notes (Signed)
Pharmacy Antibiotic Note  Michelle [name] is a 59 y.o. female admitted on 08/28/2019 with pneumonia.  Pharmacy has been consulted for Cefepime dosing.  Plan: Cefepime 2g IV q8h  Height: 5\' 3"  (160 cm) Weight: 160 lb (72.6 kg) IBW/kg (Calculated) : 52.4  Temp (24hrs), Avg:99.2 F (37.3 C), Min:99.2 F (37.3 C), Max:99.2 F (37.3 C)  Recent Labs  Lab 08/28/19 1635  WBC 12.0*  CREATININE 0.71  LATICACIDVEN 1.5    Estimated Creatinine Clearance: 73.2 mL/min (by C-G formula based on SCr of 0.71 mg/dL).    Allergies  Allergen Reactions  . Amoxicillin     Patient has tolerated cephalosporins in the past  . Ativan [Lorazepam]     Makes anxiety worse     Antimicrobials this admission: Vancomycin 3/26 x 1 in ED  Cefepime 3/26 >>   Dose adjustments this admission:  Microbiology results:  Thank you for allowing pharmacy to be a part of this patient's care.  Paulina Fusi, PharmD, BCPS 08/28/2019 9:46 PM

## 2019-08-28 NOTE — ED Notes (Signed)
RT at bedside. RT collected sputum/culture sample.

## 2019-08-28 NOTE — ED Notes (Signed)
Daughter Caren Griffins updated with pt's verbal okay.

## 2019-08-28 NOTE — H&P (Signed)
History and Physical   Michelle [name] VVO:[id number] DOB: 1960-11-24 DOA: 08/28/2019  Referring MD/NP/PA: Dr. Duffy Bruce  PCP: Frazier Richards, MD   Patient coming from: Home  Chief Complaint: Shortness of breath  HPI: Michelle [name] is a 59 y.o. female with medical history significant of severe COPD with chronic tracheostomy, CHF, asthma, previous cocaine abuse, hypertension, coronary artery disease, hepatitis C, history of pneumothorax and tobacco abuse, morbid obesity who was recently admitted on March 4 with sepsis and respiratory failure requiring ICU admission.  Patient at the end of the stay was discharged home home with home health.  She was evaluated with cultures showing and growing Pseudomonas only sensitive to cefepime.  She came back today with early fevers shortness of breath as well as copious secretions.  She has also leukocytosis and early's signs of pneumonia on x-ray.  Patient is being admitted with possible tracheitis versus healthcare associated pneumonia.  Patient has significant history of recurrent hospitalizations and has a tracheostomy in place.  She is however fully awake and alert..  ED Course: Temperature 99.2 blood pressure 123/94 pulse 102 respirate of 20 oxygen sat 95% on 2 L.  White count 12.1 hemoglobin 13.8319.  Lactic acid 1.5, urinalysis so far negative.  COVID-19 negative.  Chest x-ray showed no acute findings and CT abdomen pelvis showed mild extrahepatic biliary ductal dilatation.  Patient suspected to have tracheitis and possibly early healthcare associated pneumonia.  Initiated on cefepime and being admitted to the hospital for further treatment and management.  Review of Systems: As per HPI otherwise 10 point review of systems negative.    Past Medical History:  Diagnosis Date  . Allergy   . Anxiety   . Asthma   . CHF (congestive heart failure) ([place])   . Cocaine abuse ([place])   . COPD (chronic obstructive pulmonary disease) ([place])   . Coronary  artery disease   . Emphysema/COPD ([place])   . Hepatitis C   . High cholesterol   . Hypertension   . Pneumothorax   . Tobacco abuse     Past Surgical History:  Procedure Laterality Date  . CARDIAC CATHETERIZATION    . CHEST TUBE INSERTION    . IR GASTROSTOMY TUBE MOD SED  10/31/2017  . TRACHEOSTOMY TUBE PLACEMENT N/A 10/17/2017   Procedure: TRACHEOSTOMY;  Surgeon: Carloyn Manner, MD;  Location: [facility] ORS;  Service: ENT;  Laterality: N/A;     reports that she quit smoking about 2 years ago. Her smoking use included cigarettes. She has a 4.10 pack-year smoking history. She has never used smokeless tobacco. She reports previous drug use. Drug: "Crack" cocaine. She reports that she does not drink alcohol.  Allergies  Allergen Reactions  . Amoxicillin     Patient has tolerated cephalosporins in the past  . Ativan [Lorazepam]     Makes anxiety worse     Family History  Problem Relation Age of Onset  . Lung cancer Mother   . Asthma Mother   . CVA Father   . CAD Father   . Stroke Father   . Breast cancer Maternal Aunt 67  . COPD Sister      Prior to Admission medications   Medication Sig Start Date End Date Taking? Authorizing Provider  aspirin 81 MG chewable tablet Chew 81 mg by mouth daily.     [provider]  blood glucose meter kit and supplies KIT Dispense based on patient and insurance preference. Use up to four times daily as  directed. (FOR ICD-9 250.00, 250.01). 07/05/19   Nicole Kindred A, DO  budesonide (PULMICORT) 0.25 MG/2ML nebulizer solution Take 2 mLs (0.25 mg total) by nebulization 2 (two) times daily. Patient taking differently: Take 0.5 mg by nebulization 2 (two) times daily.  12/31/17   Conforti, John, DO  cetirizine (ZYRTEC) 10 MG tablet Place 10 mg into feeding tube daily.     [provider]  clonazePAM (KLONOPIN) 0.5 MG tablet Take 3 tablets (1.5 mg total) by mouth 3 (three) times daily. Patient taking differently: Take 1.5 mg by mouth 3  (three) times daily.  07/03/19   Ezekiel Slocumb, DO  cloNIDine (CATAPRES - DOSED IN MG/24 HR) 0.1 mg/24hr patch Place 0.1 mg onto the skin once a week.    [provider]  Ensure Max Protein (ENSURE MAX PROTEIN) LIQD Take 330 mLs (11 oz total) by mouth 2 (two) times daily. 07/03/19   Ezekiel Slocumb, DO  famotidine (PEPCID) 20 MG tablet Place 1 tablet (20 mg total) into feeding tube daily. 12/31/17   Conforti, John, DO  ferrous sulfate 325 (65 FE) MG tablet Take 325 mg by mouth every other day.    [provider]  fluticasone (FLONASE) 50 MCG/ACT nasal spray Place 1 spray into both nostrils daily.     [provider]  guaiFENesin (ROBITUSSIN) 100 MG/5ML SOLN Take 5 mLs (100 mg total) by mouth every 4 (four) hours as needed for cough or to loosen phlegm. 08/12/19   Flora Lipps, MD  guaiFENesin-dextromethorphan (ROBITUSSIN DM) 100-10 MG/5ML syrup Take 10 mLs by mouth every 6 (six) hours as needed for cough. Patient taking differently: Take 5 mLs by mouth every 6 (six) hours as needed for cough.  07/03/19   Nicole Kindred A, DO  insulin aspart (NOVOLOG) 100 UNIT/ML injection Inject 0-20 Units into the skin 3 (three) times daily with meals. 07/05/19 08/11/19  Nicole Kindred A, DO  insulin detemir (LEVEMIR) 100 UNIT/ML injection Inject 0.25 mLs (25 Units total) into the skin 2 (two) times daily. 07/05/19 08/11/19  Nicole Kindred A, DO  Insulin Pen Needle (PEN NEEDLES) 32G X 6 MM MISC 1 each by Does not apply route 4 (four) times daily - after meals and at bedtime. 07/05/19   Nicole Kindred A, DO  ipratropium-albuterol (DUONEB) 0.5-2.5 (3) MG/3ML SOLN Take 3 mLs by nebulization every 6 (six) hours. Patient taking differently: Take 3 mLs by nebulization every 6 (six) hours as needed (for shortness of breath).  12/31/17   Conforti, John, DO  levofloxacin (LEVAQUIN) 750 MG tablet Place 1 tablet (750 mg total) into feeding tube daily at 6 PM. 08/15/19   Tyler Pita, MD  mirtazapine  (REMERON SOL-TAB) 30 MG disintegrating tablet Take 1 tablet (30 mg total) by mouth at bedtime. 07/05/19   Ezekiel Slocumb, DO  oxyCODONE-acetaminophen (PERCOCET/ROXICET) 5-325 MG tablet Take 1 tablet by mouth every 8 (eight) hours as needed.    [provider]  PARoxetine (PAXIL) 20 MG tablet Place 1 tablet (20 mg total) into feeding tube at bedtime. Patient taking differently: Place 20 mg into feeding tube daily.  12/31/17   Conforti, John, DO  polyethylene glycol (MIRALAX / GLYCOLAX) 17 g packet Place 17 g into feeding tube daily.    [provider]  predniSONE (DELTASONE) 20 MG tablet Take 1 tablet (20 mg total) by mouth daily with breakfast. 10 days 08/15/19   Tyler Pita, MD  QUEtiapine (SEROQUEL) 100 MG tablet Place 1 tablet (100 mg  total) into feeding tube at bedtime. 07/03/19   Ezekiel Slocumb, DO  senna (SENOKOT) 8.6 MG TABS tablet Place 1-2 tablets into feeding tube at bedtime.     [provider]    Physical Exam: Vitals:   08/28/19 2145 08/28/19 2200 08/28/19 2215 08/28/19 2230  BP:  101/74  121/85  Pulse: 80 76 78 78  Resp: 20 20 18 18   Temp:      TempSrc:      SpO2: 96% 95% 97% 97%  Weight:      Height:          Constitutional: Morbidly obese, no distress Vitals:   08/28/19 2145 08/28/19 2200 08/28/19 2215 08/28/19 2230  BP:  101/74  121/85  Pulse: 80 76 78 78  Resp: 20 20 18 18   Temp:      TempSrc:      SpO2: 96% 95% 97% 97%  Weight:      Height:       Eyes: PERRL, lids and conjunctivae normal ENMT: Mucous membranes are moist. Posterior pharynx clear of any exudate or lesions.Normal dentition.  Neck: Webbed neck, tracheostomy in place, copious secretion Respiratory: Coarse breath sound, no wheeze, mild crackles normal respiratory effort. No accessory muscle use.  Cardiovascular: Sinus tachycardia, no murmurs / rubs / gallops. No extremity edema. 2+ pedal pulses. No carotid bruits.  Abdomen: no tenderness, no masses palpated.  No hepatosplenomegaly. Bowel sounds positive.  Musculoskeletal: no clubbing / cyanosis. No joint deformity upper and lower extremities. Good ROM, no contractures. Normal muscle tone.  Skin: no rashes, lesions, ulcers. No induration Neurologic: CN 2-12 grossly intact. Sensation intact, DTR normal. Strength 5/5 in all 4.  Psychiatric: Normal judgment and insight. Alert and oriented x 3. Normal mood.     Labs on Admission: I have personally reviewed following labs and imaging studies  CBC: Recent Labs  Lab 08/28/19 1635  WBC 12.0*  NEUTROABS 9.1*  HGB 13.8  HCT 43.8  MCV 82.8  PLT 128   Basic Metabolic Panel: Recent Labs  Lab 08/28/19 1635  NA 142  K 3.4*  CL 108  CO2 23  GLUCOSE 120*  BUN 20  CREATININE 0.71  CALCIUM 9.0   GFR: Estimated Creatinine Clearance: 73.2 mL/min (by C-G formula based on SCr of 0.71 mg/dL). Liver Function Tests: Recent Labs  Lab 08/28/19 1635  AST 15  ALT 15  ALKPHOS 101  BILITOT 0.8  PROT 7.8  ALBUMIN 4.0   No results for input(s): LIPASE, AMYLASE in the last 168 hours. No results for input(s): AMMONIA in the last 168 hours. Coagulation Profile: Recent Labs  Lab 08/28/19 1635  INR 1.0   Cardiac Enzymes: No results for input(s): CKTOTAL, CKMB, CKMBINDEX, TROPONINI in the last 168 hours. BNP (last 3 results) No results for input(s): PROBNP in the last 8760 hours. HbA1C: No results for input(s): HGBA1C in the last 72 hours. CBG: Recent Labs  Lab 08/28/19 2250  GLUCAP 128*   Lipid Profile: No results for input(s): CHOL, HDL, LDLCALC, TRIG, CHOLHDL, LDLDIRECT in the last 72 hours. Thyroid Function Tests: No results for input(s): TSH, T4TOTAL, FREET4, T3FREE, THYROIDAB in the last 72 hours. Anemia Panel: No results for input(s): VITAMINB12, FOLATE, FERRITIN, TIBC, IRON, RETICCTPCT in the last 72 hours. Urine analysis:    Component Value Date/Time   COLORURINE YELLOW (A) 08/28/2019 1712   APPEARANCEUR CLEAR (A) 08/28/2019  1712   APPEARANCEUR Clear 10/30/2013 1230   LABSPEC >1.046 (H) 08/28/2019 1712   LABSPEC  1.005 10/30/2013 1230   PHURINE 6.0 08/28/2019 1712   GLUCOSEU NEGATIVE 08/28/2019 1712   GLUCOSEU Negative 10/30/2013 1230   HGBUR NEGATIVE 08/28/2019 1712   BILIRUBINUR NEGATIVE 08/28/2019 1712   BILIRUBINUR Negative 10/30/2013 1230   [place] 08/28/2019 1712   PROTEINUR 30 (A) 08/28/2019 1712   NITRITE NEGATIVE 08/28/2019 1712   LEUKOCYTESUR NEGATIVE 08/28/2019 1712   LEUKOCYTESUR Negative 10/30/2013 1230   Sepsis Labs: @LABRCNTIP (procalcitonin:4,lacticidven:4) ) Recent Results (from the past 240 hour(s))  Respiratory Panel by RT PCR (Flu A&B, Covid) - Nasopharyngeal Swab     Status: None   Collection Time: 08/28/19 10:08 PM   Specimen: Nasopharyngeal Swab  Result Value Ref Range Status   SARS Coronavirus 2 by RT PCR NEGATIVE NEGATIVE Final    Comment: (NOTE) SARS-CoV-2 target nucleic acids are NOT DETECTED. The SARS-CoV-2 RNA is generally detectable in upper respiratoy specimens during the acute phase of infection. The lowest concentration of SARS-CoV-2 viral copies this assay can detect is 131 copies/mL. A negative result does not preclude SARS-Cov-2 infection and should not be used as the sole basis for treatment or other patient management decisions. A negative result may occur with  improper specimen collection/handling, submission of specimen other than nasopharyngeal swab, presence of viral mutation(s) within the areas targeted by this assay, and inadequate number of viral copies (<131 copies/mL). A negative result must be combined with clinical observations, patient history, and epidemiological information. The expected result is Negative. Fact Sheet for Patients:  PinkCheek.be Fact Sheet for Healthcare Providers:  GravelBags.it This test is not yet ap proved or cleared by the [place] FDA and  has been  authorized for detection and/or diagnosis of SARS-CoV-2 by FDA under an Emergency Use Authorization (EUA). This EUA will remain  in effect (meaning this test can be used) for the duration of the COVID-19 declaration under Section 564(b)(1) of the Act, 21 U.S.C. section 360bbb-3(b)(1), unless the authorization is terminated or revoked sooner.    Influenza A by PCR NEGATIVE NEGATIVE Final   Influenza B by PCR NEGATIVE NEGATIVE Final    Comment: (NOTE) The Xpert Xpress SARS-CoV-2/FLU/RSV assay is intended as an aid in  the diagnosis of influenza from Nasopharyngeal swab specimens and  should not be used as a sole basis for treatment. Nasal washings and  aspirates are unacceptable for Xpert Xpress SARS-CoV-2/FLU/RSV  testing. Fact Sheet for Patients: PinkCheek.be Fact Sheet for Healthcare Providers: GravelBags.it This test is not yet approved or cleared by the [place] FDA and  has been authorized for detection and/or diagnosis of SARS-CoV-2 by  FDA under an Emergency Use Authorization (EUA). This EUA will remain  in effect (meaning this test can be used) for the duration of the  Covid-19 declaration under Section 564(b)(1) of the Act, 21  U.S.C. section 360bbb-3(b)(1), unless the authorization is  terminated or revoked. Performed at [facility], [address], [place], [place] [address]      Radiological Exams on Admission: CT ABDOMEN PELVIS W CONTRAST  Result Date: 08/28/2019 CLINICAL DATA:  Nausea and vomiting. Left-sided flank and abdominal pain. EXAM: CT ABDOMEN AND PELVIS WITH CONTRAST TECHNIQUE: Multidetector CT imaging of the abdomen and pelvis was performed using the standard protocol following bolus administration of intravenous contrast. CONTRAST:  123m OMNIPAQUE IOHEXOL 300 MG/ML  SOLN COMPARISON:  Abdominal radiograph 08/07/2019 FINDINGS: Lower chest: Emphysema. Linear opacity in the right lower lobe  favors atelectasis. Heart is normal in size. Hepatobiliary: Decreased hepatic density consistent with steatosis. More focal  fatty infiltration adjacent to the falciform ligament. No discrete focal hepatic lesion. Gallbladder physiologically distended. No calcified gallstone or pericholecystic inflammation. There is mild extrahepatic biliary ductal dilatation with common bile duct measuring 8 mm distally. Pancreas: No acute peripancreatic inflammation. There is proximal pancreatic ductal dilatation at 6 mm. No evidence of pancreatic cyst or focal lesion. Spleen: Normal in size without focal abnormality. Adrenals/Urinary Tract: No adrenal nodule. No hydronephrosis or perinephric edema. Homogeneous renal enhancement with symmetric excretion on delayed phase imaging. 16 mm cyst in the mid right kidney. Additional tiny hypodensity in the lower right kidney is incompletely characterized due to size. Urinary bladder is physiologically distended without wall thickening. Stomach/Bowel: Gastrostomy tube appropriately positioned in the stomach. No abnormality along the gastrostomy tube tract. Stomach is decompressed and unremarkable. No small bowel inflammation or obstruction. Normal appendix. Small to moderate colonic stool burden. No colonic wall thickening or inflammation. No significant diverticular disease. Vascular/Lymphatic: Aorto bi-iliac atherosclerosis, moderate but advanced for age. No aortic aneurysm. The portal vein is patent. No acute vascular findings. No abdominopelvic adenopathy. Reproductive: Bilateral tubal ligation clips. Uterus unremarkable. Ovaries quiescent. No adnexal mass. Other: No free air, free fluid, or intra-abdominal fluid collection. Small fat containing umbilical hernia. Soft tissue densities in the lower anterior abdominal wall typical of medication injection sites. Musculoskeletal: There are no acute or suspicious osseous abnormalities. Degenerative disc disease at L5-S1. IMPRESSION: 1. Mild  extrahepatic biliary ductal dilatation and proximal pancreatic ductal dilatation, of uncertain etiology and chronicity. No significant gallbladder dilatation or evidence of focal pancreatic or obstructing lesion. Recommend correlation with LFTs. If elevated, recommend further evaluation with MRCP or ERCP. MRCP should only be considered if patient is able to tolerate breath hold technique, and could be performed on an elective outpatient basis. 2. No additional acute abnormality in the abdomen/pelvis. 3. Hepatic steatosis. 4. Aorto bi-iliac atherosclerosis, age advanced. 5. Gastrostomy tube appropriately positioned in the stomach. Aortic Atherosclerosis (ICD10-I70.0). Electronically Signed   By: Keith Rake M.D.   On: 08/28/2019 19:20   DG Chest Portable 1 View  Result Date: 08/28/2019 CLINICAL DATA:  Difficulty breathing. EXAM: PORTABLE CHEST 1 VIEW COMPARISON:  August 06, 2019 FINDINGS: There is stable tracheostomy tube positioning. The lungs are hyperinflated. There is no evidence of acute infiltrate, pleural effusion or pneumothorax. The heart size and mediastinal contours are within normal limits. The visualized skeletal structures are unremarkable. IMPRESSION: No active disease. Electronically Signed   By: Virgina Norfolk M.D.   On: 08/28/2019 17:31      Assessment/Plan Principal Problem:   HCAP (healthcare-associated pneumonia) Active Problems:   Tobacco abuse   Malnutrition of moderate degree (HCC)   Sepsis (HCC)   Acute on chronic respiratory failure with hypoxia (HCC)   HTN (hypertension)   DM (diabetes mellitus) ([place])   Pseudomonas respiratory infection   Hyperlipidemia associated with type 2 diabetes mellitus (HCC)   PEG (percutaneous endoscopic gastrostomy) status ([place])   Hypokalemia     #1 tracheitis versus HCAP: Patient symptoms consistent with pneumonia but chest x-ray so far negative.  Most likely early.  Initiate IV cefepime and continue treatment.  This is based on  her previous cultures showing Pseudomonas aeruginosa with Serratia that is sensitive to cefepime.  We will also get sputum for blood cultures.  Continue close monitoring.  #2 diabetes: Resume home regimen with sliding scale insulin  #3 hyperlipidemia: Resume home regimen and monitor.  #4 hypertension: Blood pressure appears controlled.  Continue to monitor  #5 hypokalemia: Replete potassium.  #  6 tobacco abuse: We will offer nicotine patch.  #7 morbid obesity: In the setting of protein calorie malnutrition.  Dietary counseling   DVT prophylaxis: Lovenox Code Status: DNR Family Communication: No family at bedside Disposition Plan: To be determined Consults called: None Admission status: Inpatient  Severity of Illness: The appropriate patient status for this patient is INPATIENT. Inpatient status is judged to be reasonable and necessary in order to provide the required intensity of service to ensure the patient's safety. The patient's presenting symptoms, physical exam findings, and initial radiographic and laboratory data in the context of their chronic comorbidities is felt to place them at high risk for further clinical deterioration. Furthermore, it is not anticipated that the patient will be medically stable for discharge from the hospital within 2 midnights of admission. The following factors support the patient status of inpatient.   " The patient's presenting symptoms include shortness of breath. " The worrisome physical exam findings include coarse breath sounds with copious tracheostomy secretion. " The initial radiographic and laboratory data are worrisome because of leukocytosis. " The chronic co-morbidities include chronic respiratory failure.   * I certify that at the point of admission it is my clinical judgment that the patient will require inpatient hospital care spanning beyond 2 midnights from the point of admission due to high intensity of service, high risk for  further deterioration and high frequency of surveillance required.Barbette Merino MD [facility] Pager [phone]  If 7PM-7AM, please contact night-coverage [email or web address] Password [facility]  08/28/2019, 11:38 PM

## 2019-08-28 NOTE — ED Notes (Signed)
Attempted again for 24g at L lateral ac for blood cultures. Paige RN looking too.

## 2019-08-28 NOTE — ED Triage Notes (Signed)
PT to ED via EMS. PT was recently d/c with sepsis and PNA. PT has had to have incrased suctioning to trach (every 30 minutes) and sputum has become very purulent looking. PT reports 8/10 left sided flank/abd pain worse with breathing. PT had 700mg  tylenol at 1100 this monring.

## 2019-08-29 DIAGNOSIS — J988 Other specified respiratory disorders: Secondary | ICD-10-CM

## 2019-08-29 DIAGNOSIS — B965 Pseudomonas (aeruginosa) (mallei) (pseudomallei) as the cause of diseases classified elsewhere: Secondary | ICD-10-CM

## 2019-08-29 DIAGNOSIS — J189 Pneumonia, unspecified organism: Secondary | ICD-10-CM

## 2019-08-29 DIAGNOSIS — J9621 Acute and chronic respiratory failure with hypoxia: Secondary | ICD-10-CM

## 2019-08-29 DIAGNOSIS — E440 Moderate protein-calorie malnutrition: Secondary | ICD-10-CM

## 2019-08-29 LAB — CBC
HCT: 37.4 % (ref 36.0–46.0)
Hemoglobin: 11.6 g/dL — ABNORMAL LOW (ref 12.0–15.0)
MCH: 26.3 pg (ref 26.0–34.0)
MCHC: 31 g/dL (ref 30.0–36.0)
MCV: 84.8 fL (ref 80.0–100.0)
Platelets: 228 10*3/uL (ref 150–400)
RBC: 4.41 MIL/uL (ref 3.87–5.11)
RDW: 16 % — ABNORMAL HIGH (ref 11.5–15.5)
WBC: 7.2 10*3/uL (ref 4.0–10.5)
nRBC: 0 % (ref 0.0–0.2)

## 2019-08-29 LAB — COMPREHENSIVE METABOLIC PANEL
ALT: 13 U/L (ref 0–44)
AST: 14 U/L — ABNORMAL LOW (ref 15–41)
Albumin: 3.3 g/dL — ABNORMAL LOW (ref 3.5–5.0)
Alkaline Phosphatase: 82 U/L (ref 38–126)
Anion gap: 9 (ref 5–15)
BUN: 21 mg/dL — ABNORMAL HIGH (ref 6–20)
CO2: 21 mmol/L — ABNORMAL LOW (ref 22–32)
Calcium: 8.2 mg/dL — ABNORMAL LOW (ref 8.9–10.3)
Chloride: 109 mmol/L (ref 98–111)
Creatinine, Ser: 0.73 mg/dL (ref 0.44–1.00)
GFR calc Af Amer: >60 mL/min (ref >60)
GFR calc non Af Amer: >60 mL/min (ref >60)
Glucose, Bld: 137 mg/dL — ABNORMAL HIGH (ref 70–99)
Potassium: 3.3 mmol/L — ABNORMAL LOW (ref 3.5–5.1)
Sodium: 139 mmol/L (ref 135–145)
Total Bilirubin: 0.8 mg/dL (ref 0.3–1.2)
Total Protein: 6.6 g/dL (ref 6.5–8.1)

## 2019-08-29 LAB — PREGNANCY, URINE: Preg Test, Ur: NEGATIVE

## 2019-08-29 LAB — STREP PNEUMONIAE URINARY ANTIGEN: Strep Pneumo Urinary Antigen: NEGATIVE

## 2019-08-29 LAB — GLUCOSE, CAPILLARY
Glucose-Capillary: 136 mg/dL — ABNORMAL HIGH (ref 70–99)
Glucose-Capillary: 111 mg/dL — ABNORMAL HIGH (ref 70–99)
Glucose-Capillary: 131 mg/dL — ABNORMAL HIGH (ref 70–99)
Glucose-Capillary: 128 mg/dL — ABNORMAL HIGH (ref 70–99)
Glucose-Capillary: 149 mg/dL — ABNORMAL HIGH (ref 70–99)

## 2019-08-29 LAB — HIV ANTIBODY (ROUTINE TESTING W REFLEX): HIV Screen 4th Generation wRfx: NONREACTIVE

## 2019-08-29 LAB — HEMOGLOBIN A1C
Hgb A1c MFr Bld: 8.6 % — ABNORMAL HIGH (ref 4.8–5.6)
Mean Plasma Glucose: 200.12 mg/dL

## 2019-08-29 LAB — MRSA PCR SCREENING: MRSA by PCR: NEGATIVE

## 2019-08-29 MED ORDER — POLYETHYLENE GLYCOL 3350 17 G PO PACK: 17.0000 g | PACK | Freq: Every day | ORAL | Status: DC | PRN

## 2019-08-29 MED ORDER — CHLORHEXIDINE GLUCONATE CLOTH 2 % EX PADS
6.0000 | MEDICATED_PAD | Freq: Every day | CUTANEOUS | Status: DC
  Administered 2019-08-29 – 2019-09-03 (×5): 6 via TOPICAL

## 2019-08-29 MED ORDER — SENNA 8.6 MG PO TABS: 1.0000 | ORAL_TABLET | Freq: Every evening | ORAL | Status: DC | PRN

## 2019-08-29 MED ORDER — CLONAZEPAM 1 MG PO TABS
1.5000 mg | ORAL_TABLET | Freq: Three times a day (TID) | ORAL | Status: DC
  Administered 2019-08-29 – 2019-08-31 (×7): 1.5 mg via ORAL
  Filled 2019-08-29: qty 1
  Filled 2019-08-29: qty 3
  Filled 2019-08-29 (×6): qty 1

## 2019-08-29 MED ORDER — PAROXETINE HCL 20 MG PO TABS
20.0000 mg | ORAL_TABLET | Freq: Every day | ORAL | Status: DC
  Administered 2019-08-29 – 2019-09-03 (×6): 20 mg
  Filled 2019-08-29 (×6): qty 1

## 2019-08-29 MED ORDER — IPRATROPIUM-ALBUTEROL 0.5-2.5 (3) MG/3ML IN SOLN
3.0000 mL | Freq: Four times a day (QID) | RESPIRATORY_TRACT | Status: DC | PRN
  Administered 2019-08-30 – 2019-08-31 (×2): 3 mL via RESPIRATORY_TRACT
  Filled 2019-08-29 (×2): qty 3

## 2019-08-29 MED ORDER — HYDRALAZINE HCL 20 MG/ML IJ SOLN
10.0000 mg | INTRAMUSCULAR | Status: DC | PRN
  Administered 2019-08-31 (×2): 10 mg via INTRAVENOUS
  Filled 2019-08-29 (×2): qty 1

## 2019-08-29 MED ORDER — ASPIRIN 81 MG PO CHEW
81.0000 mg | CHEWABLE_TABLET | Freq: Every day | ORAL | Status: DC
  Administered 2019-08-29 – 2019-09-03 (×6): 81 mg via ORAL
  Filled 2019-08-29 (×6): qty 1

## 2019-08-29 MED ORDER — FERROUS SULFATE 325 (65 FE) MG PO TABS: 325.0000 mg | ORAL_TABLET | ORAL | Status: DC

## 2019-08-29 MED ORDER — QUETIAPINE FUMARATE 100 MG PO TABS
100.0000 mg | ORAL_TABLET | Freq: Every day | ORAL | Status: DC
  Administered 2019-08-29 – 2019-09-02 (×5): 100 mg
  Filled 2019-08-29: qty 1
  Filled 2019-08-29 (×2): qty 4
  Filled 2019-08-29 (×2): qty 1

## 2019-08-29 MED ORDER — FAMOTIDINE 20 MG PO TABS
20.0000 mg | ORAL_TABLET | Freq: Two times a day (BID) | ORAL | Status: DC
  Administered 2019-08-29 – 2019-09-03 (×11): 20 mg
  Filled 2019-08-29 (×11): qty 1

## 2019-08-29 MED ORDER — MIRTAZAPINE 15 MG PO TBDP
30.0000 mg | ORAL_TABLET | Freq: Every day | ORAL | Status: DC
  Administered 2019-08-29 – 2019-09-02 (×5): 30 mg via ORAL
  Filled 2019-08-29 (×7): qty 2

## 2019-08-29 MED ORDER — GUAIFENESIN-DM 100-10 MG/5ML PO SYRP: 5.0000 mL | ORAL_SOLUTION | Freq: Four times a day (QID) | ORAL | Status: DC | PRN

## 2019-08-29 MED ORDER — OXYCODONE-ACETAMINOPHEN 5-325 MG PO TABS
1.0000 | ORAL_TABLET | Freq: Three times a day (TID) | ORAL | Status: DC | PRN
  Administered 2019-08-29 – 2019-08-30 (×2): 1 via ORAL
  Filled 2019-08-29 (×2): qty 1

## 2019-08-29 MED ORDER — ENSURE MAX PROTEIN PO LIQD
11.0000 [oz_av] | Freq: Two times a day (BID) | ORAL | Status: DC
  Administered 2019-08-29 – 2019-09-03 (×5): 11 [oz_av] via ORAL
  Filled 2019-08-29: qty 330

## 2019-08-29 MED ORDER — FLUTICASONE PROPIONATE 50 MCG/ACT NA SUSP
1.0000 | Freq: Every day | NASAL | Status: DC | PRN
  Administered 2019-09-02: 1 via NASAL
  Filled 2019-08-29 (×2): qty 16

## 2019-08-29 MED ORDER — BUDESONIDE 0.25 MG/2ML IN SUSP
0.5000 mg | Freq: Two times a day (BID) | RESPIRATORY_TRACT | Status: DC
  Administered 2019-08-29 – 2019-09-03 (×11): 0.5 mg via RESPIRATORY_TRACT
  Filled 2019-08-29 (×12): qty 4

## 2019-08-29 MED ORDER — POTASSIUM CHLORIDE 10 MEQ/100ML IV SOLN
10.0000 meq | INTRAVENOUS | Status: AC
  Administered 2019-08-29 (×4): 10 meq via INTRAVENOUS
  Filled 2019-08-29 (×4): qty 100

## 2019-08-29 MED ORDER — IPRATROPIUM-ALBUTEROL 0.5-2.5 (3) MG/3ML IN SOLN
3.0000 mL | RESPIRATORY_TRACT | Status: DC
  Administered 2019-08-29 – 2019-09-03 (×32): 3 mL via RESPIRATORY_TRACT
  Filled 2019-08-29 (×33): qty 3

## 2019-08-29 NOTE — ED Notes (Signed)
RN attempted to call report to ICU however receiving RN was providing care for another patient and will call RN Candace Ramus within 15 minutes to receive report.

## 2019-08-29 NOTE — Progress Notes (Signed)
Pharmacy Antibiotic Note  Michelle [name] is a 59 y.o. female admitted on 08/28/2019 with pneumonia.  Pharmacy has been consulted for Cefepime dosing.  Plan: Cefepime 2g IV q8h  Height: 5\' 3"  (160 cm) Weight: 190 lb 0.6 oz (86.2 kg) IBW/kg (Calculated) : 52.4  Temp (24hrs), Avg:99.1 F (37.3 C), Min:99 F (37.2 C), Max:99.2 F (37.3 C)  Recent Labs  Lab 08/28/19 1635 08/29/19 0212  WBC 12.0* 7.2  CREATININE 0.71 0.73  LATICACIDVEN 1.5  --     Estimated Creatinine Clearance: 79.7 mL/min (by C-G formula based on SCr of 0.73 mg/dL).    Allergies  Allergen Reactions  . Amoxicillin     Patient has tolerated cephalosporins in the past  . Ativan [Lorazepam]     Makes anxiety worse     Antimicrobials this admission: Vancomycin 3/26 x 1 in ED  Cefepime 3/26 >>   Dose adjustments this admission:  Microbiology results:  Thank you for allowing pharmacy to be a part of this patient's care.  Eleonore Chiquito, PharmD, BCPS 08/29/2019 1:29 PM

## 2019-08-29 NOTE — Consult Note (Signed)
Name: Michelle [name] MRN: [id number] DOB: February 24, 1961    ADMISSION DATE:  08/28/2019 CONSULTATION DATE: 08/29/2019  REFERRING MD : Sharion Settler, NP  CHIEF COMPLAINT: Shortness of Breath   BRIEF PATIENT DESCRIPTION:  59 year old female, DNR/DNI, with past medical history notable for severe COPD requiring chronic home vent via chronic tracheostomy admitted with acute on chronic respiratory failure secondary to trachitis vs. recurrent pneumonia   SIGNIFICANT EVENTS/STUDIES:  03/26: Pt initially admitted to the telemetry unit with pneumonia, but due to chronic home vent requirements pt changed to stepdown status and PCCM consulted 03/27  HISTORY OF PRESENT ILLNESS:   Michelle [name] is a 59 year old female, DNR/DNI, with a past medical history notable for severe COPD with chronic tracheostomy, asthma, CHF, cocaine abuse, Multiple readmission secondary to recurrent Pneumonia, CAD, hypertension, hepatitis C, pneumothorax, tobacco abuse who presents to [facility] ED on 03/26 with fevers, shortness of breath, and copious secretions on chronic home ventilator via chronic tracheostomy. Lab results revealed K+ 3.4, glucose 120, lactic acid 1.5, wbc 12.0, and Influenza PCR/COVID-19 negative.  CXR negative for pneumonia.  Due to concern of trachitis vs. pneumonia, antibiotic therapy initiated (cefepime and vancomycin) along with 1L NS bolus.  She was initially admitted to the telemetry unit per hospitalist team for additional workup and treatment.  However, due to chronic ventilator requirements pts changed to stepdown status and PCCM team consulted to assist with management.   CHRONIC HOME VENT  PAST MEDICAL HISTORY :   has a past medical history of Allergy, Anxiety, Asthma, CHF (congestive heart failure) ([place]), Cocaine abuse ([place]), COPD (chronic obstructive pulmonary disease) ([place]), Coronary artery disease, Emphysema/COPD ([place]), Hepatitis C, High cholesterol, Hypertension, Pneumothorax, and Tobacco abuse.  has a  past surgical history that includes Cardiac catheterization; Chest tube insertion; Tracheostomy tube placement (N/A, 10/17/2017); and IR GASTROSTOMY TUBE MOD SED (10/31/2017). Prior to Admission medications   Medication Sig Start Date End Date Taking? Authorizing Provider  aspirin 81 MG chewable tablet Chew 81 mg by mouth daily.    Yes [provider]  budesonide (PULMICORT) 0.25 MG/2ML nebulizer solution Take 2 mLs (0.25 mg total) by nebulization 2 (two) times daily. Patient taking differently: Take 0.5 mg by nebulization 2 (two) times daily.  12/31/17  Yes Conforti, John, DO  cetirizine (ZYRTEC) 10 MG tablet Place 10 mg into feeding tube daily as needed.    Yes [provider]  clonazePAM (KLONOPIN) 0.5 MG tablet Take 3 tablets (1.5 mg total) by mouth 3 (three) times daily. Patient taking differently: Take 1.5 mg by mouth 3 (three) times daily.  07/03/19  Yes Nicole Kindred A, DO  cloNIDine (CATAPRES - DOSED IN MG/24 HR) 0.1 mg/24hr patch Place 0.1 mg onto the skin once a week.   Yes [provider]  famotidine (PEPCID) 20 MG tablet Place 1 tablet (20 mg total) into feeding tube daily. 12/31/17  Yes Conforti, John, DO  fluticasone (FLONASE) 50 MCG/ACT nasal spray Place 1 spray into both nostrils daily as needed.    Yes [provider]  guaiFENesin (ROBITUSSIN) 100 MG/5ML SOLN Take 5 mLs (100 mg total) by mouth every 4 (four) hours as needed for cough or to loosen phlegm. 08/12/19  Yes Stuti Sandin, Maretta Bees, MD  guaiFENesin-dextromethorphan (ROBITUSSIN DM) 100-10 MG/5ML syrup Take 10 mLs by mouth every 6 (six) hours as needed for cough. Patient taking differently: Take 5 mLs by mouth every 6 (six) hours as needed for cough.  07/03/19  Yes Nicole Kindred A, DO  insulin aspart (NOVOLOG)  100 UNIT/ML injection Inject 0-20 Units into the skin 3 (three) times daily with meals. 07/05/19 08/28/19 Yes Nicole Kindred A, DO  insulin detemir (LEVEMIR) 100 UNIT/ML injection Inject 0.25 mLs  (25 Units total) into the skin 2 (two) times daily. 07/05/19 08/28/19 Yes Nicole Kindred A, DO  ipratropium-albuterol (DUONEB) 0.5-2.5 (3) MG/3ML SOLN Take 3 mLs by nebulization every 6 (six) hours. Patient taking differently: Take 3 mLs by nebulization every 6 (six) hours as needed (for shortness of breath).  12/31/17  Yes Conforti, John, DO  mirtazapine (REMERON SOL-TAB) 30 MG disintegrating tablet Take 1 tablet (30 mg total) by mouth at bedtime. 07/05/19  Yes Nicole Kindred A, DO  oxyCODONE-acetaminophen (PERCOCET/ROXICET) 5-325 MG tablet Take 1 tablet by mouth every 8 (eight) hours as needed.   Yes [provider]  PARoxetine (PAXIL) 20 MG tablet Place 1 tablet (20 mg total) into feeding tube at bedtime. Patient taking differently: Place 20 mg into feeding tube daily.  12/31/17  Yes Conforti, John, DO  polyethylene glycol (MIRALAX / GLYCOLAX) 17 g packet Place 17 g into feeding tube daily as needed.    Yes [provider]  QUEtiapine (SEROQUEL) 100 MG tablet Place 1 tablet (100 mg total) into feeding tube at bedtime. 07/03/19  Yes Ezekiel Slocumb, DO  senna (SENOKOT) 8.6 MG TABS tablet Place 1-2 tablets into feeding tube at bedtime as needed for mild constipation.    Yes [provider]  blood glucose meter kit and supplies KIT Dispense based on patient and insurance preference. Use up to four times daily as directed. (FOR ICD-9 250.00, 250.01). 07/05/19   Ezekiel Slocumb, DO  Ensure Max Protein (ENSURE MAX PROTEIN) LIQD Take 330 mLs (11 oz total) by mouth 2 (two) times daily. 07/03/19   Nicole Kindred A, DO  ferrous sulfate 325 (65 FE) MG tablet Take 325 mg by mouth every other day.    [provider]  Insulin Pen Needle (PEN NEEDLES) 32G X 6 MM MISC 1 each by Does not apply route 4 (four) times daily - after meals and at bedtime. 07/05/19   Ezekiel Slocumb, DO  levofloxacin (LEVAQUIN) 750 MG tablet Place 1 tablet (750 mg total) into feeding tube daily at 6  PM. Patient not taking: Reported on 08/28/2019 08/15/19   Tyler Pita, MD  predniSONE (DELTASONE) 20 MG tablet Take 1 tablet (20 mg total) by mouth daily with breakfast. 10 days Patient not taking: Reported on 08/28/2019 08/15/19   Tyler Pita, MD   Allergies  Allergen Reactions  . Amoxicillin     Patient has tolerated cephalosporins in the past  . Ativan [Lorazepam]     Makes anxiety worse     FAMILY HISTORY:  family history includes Asthma in her mother; Breast cancer (age of onset: 76) in her maternal aunt; CAD in her father; COPD in her sister; CVA in her father; Lung cancer in her mother; Stroke in her father. SOCIAL HISTORY:  reports that she quit smoking about 2 years ago. Her smoking use included cigarettes. She has a 4.10 pack-year smoking history. She has never used smokeless tobacco. She reports previous drug use. Drug: "Crack" cocaine. She reports that she does not drink alcohol.  REVIEW OF SYSTEMS: Positives in BOLD  Constitutional: fever, chills, weight loss, malaise/fatigue and diaphoresis.  HENT: Negative for hearing loss, ear pain, nosebleeds, congestion, sore throat, neck pain, tinnitus and ear discharge.   Eyes: Negative for blurred vision, double vision, photophobia, pain, discharge  and redness.  Respiratory: cough, hemoptysis, sputum production, shortness of breath, wheezing and stridor.   Cardiovascular: Negative for chest pain, palpitations, orthopnea, claudication, leg swelling and PND.  Gastrointestinal: Negative for heartburn, nausea, vomiting, abdominal pain, diarrhea, constipation, blood in stool and melena.  Genitourinary: Negative for dysuria, urgency, frequency, hematuria and flank pain.  Musculoskeletal: Negative for myalgias, back pain, joint pain and falls.  Skin: Negative for itching and rash.  Neurological: Negative for dizziness, tingling, tremors, sensory change, speech change, focal weakness, seizures, loss of consciousness, weakness and  headaches.  Endo/Heme/Allergies: Negative for environmental allergies and polydipsia. Does not bruise/bleed easily.  SUBJECTIVE:   VITAL SIGNS: Temp:  [99.2 F (37.3 C)] 99.2 F (37.3 C) (03/26 1619) Pulse Rate:  [72-102] 73 (03/27 0330) Resp:  [13-23] 20 (03/27 0330) BP: (91-123)/(55-94) 96/77 (03/27 0310) SpO2:  [95 %-99 %] 99 % (03/27 0330) FiO2 (%):  [36 %] 36 % (03/27 0455) Weight:  [72.6 kg] 72.6 kg (03/26 1620)  Physical Examination:   General Appearance: No distress, s/p trach Neuro:without focal findings,  speech normal,  HEENT: PERRLA, EOM intact.   Pulmonary: normal breath sounds, No wheezing.  CardiovascularNormal S1,S2.  No m/r/g.   Abdomen: Benign, Soft, non-tender. Renal:  No costovertebral tenderness  GU:  Not performed at this time. Endoc: No evident thyromegaly Skin:   warm, no rashes, no ecchymosis  Extremities: normal, no cyanosis, clubbing. PSYCHIATRIC: Mood, affect within normal limits.   ALL OTHER ROS ARE NEGATIVE   Recent Labs  Lab 08/28/19 1635 08/29/19 0212  NA 142 139  K 3.4* 3.3*  CL 108 109  CO2 23 21*  BUN 20 21*  CREATININE 0.71 0.73  GLUCOSE 120* 137*   Recent Labs  Lab 08/28/19 1635 08/29/19 0212  HGB 13.8 11.6*  HCT 43.8 37.4  WBC 12.0* 7.2  PLT 319 228   CT ABDOMEN PELVIS W CONTRAST  Result Date: 08/28/2019 CLINICAL DATA:  Nausea and vomiting. Left-sided flank and abdominal pain. EXAM: CT ABDOMEN AND PELVIS WITH CONTRAST TECHNIQUE: Multidetector CT imaging of the abdomen and pelvis was performed using the standard protocol following bolus administration of intravenous contrast. CONTRAST:  165m OMNIPAQUE IOHEXOL 300 MG/ML  SOLN COMPARISON:  Abdominal radiograph 08/07/2019 FINDINGS: Lower chest: Emphysema. Linear opacity in the right lower lobe favors atelectasis. Heart is normal in size. Hepatobiliary: Decreased hepatic density consistent with steatosis. More focal fatty infiltration adjacent to the falciform ligament. No  discrete focal hepatic lesion. Gallbladder physiologically distended. No calcified gallstone or pericholecystic inflammation. There is mild extrahepatic biliary ductal dilatation with common bile duct measuring 8 mm distally. Pancreas: No acute peripancreatic inflammation. There is proximal pancreatic ductal dilatation at 6 mm. No evidence of pancreatic cyst or focal lesion. Spleen: Normal in size without focal abnormality. Adrenals/Urinary Tract: No adrenal nodule. No hydronephrosis or perinephric edema. Homogeneous renal enhancement with symmetric excretion on delayed phase imaging. 16 mm cyst in the mid right kidney. Additional tiny hypodensity in the lower right kidney is incompletely characterized due to size. Urinary bladder is physiologically distended without wall thickening. Stomach/Bowel: Gastrostomy tube appropriately positioned in the stomach. No abnormality along the gastrostomy tube tract. Stomach is decompressed and unremarkable. No small bowel inflammation or obstruction. Normal appendix. Small to moderate colonic stool burden. No colonic wall thickening or inflammation. No significant diverticular disease. Vascular/Lymphatic: Aorto bi-iliac atherosclerosis, moderate but advanced for age. No aortic aneurysm. The portal vein is patent. No acute vascular findings. No abdominopelvic adenopathy. Reproductive: Bilateral tubal ligation clips. Uterus unremarkable.  Ovaries quiescent. No adnexal mass. Other: No free air, free fluid, or intra-abdominal fluid collection. Small fat containing umbilical hernia. Soft tissue densities in the lower anterior abdominal wall typical of medication injection sites. Musculoskeletal: There are no acute or suspicious osseous abnormalities. Degenerative disc disease at L5-S1. IMPRESSION: 1. Mild extrahepatic biliary ductal dilatation and proximal pancreatic ductal dilatation, of uncertain etiology and chronicity. No significant gallbladder dilatation or evidence of focal  pancreatic or obstructing lesion. Recommend correlation with LFTs. If elevated, recommend further evaluation with MRCP or ERCP. MRCP should only be considered if patient is able to tolerate breath hold technique, and could be performed on an elective outpatient basis. 2. No additional acute abnormality in the abdomen/pelvis. 3. Hepatic steatosis. 4. Aorto bi-iliac atherosclerosis, age advanced. 5. Gastrostomy tube appropriately positioned in the stomach. Aortic Atherosclerosis (ICD10-I70.0). Electronically Signed   By: Keith Rake M.D.   On: 08/28/2019 19:20   DG Chest Portable 1 View  Result Date: 08/28/2019 CLINICAL DATA:  Difficulty breathing. EXAM: PORTABLE CHEST 1 VIEW COMPARISON:  August 06, 2019 FINDINGS: There is stable tracheostomy tube positioning. The lungs are hyperinflated. There is no evidence of acute infiltrate, pleural effusion or pneumothorax. The heart size and mediastinal contours are within normal limits. The visualized skeletal structures are unremarkable. IMPRESSION: No active disease. Electronically Signed   By: Virgina Norfolk M.D.   On: 08/28/2019 17:31    ASSESSMENT / PLAN:  Acute on chronic respiratory failure secondary to possible trachitis vs. suspected VAP/HCAP Chronic mechanical ventilation  Hx: COPD, Chronic Tracheostomy, and Asthma Continue home ventilator via cuffless tracheostomy for now due to stable respiratory status  Maintain O2 sats 88% to 92% Trend WBC and monitor fever curve  Trend PCT  Continue cefepime for now (given hx of pseudomonas and serratia) Follow cultures   HTN-stable  Hx: Chronic diastolic CHF, CAD, Hypercholesteremia  Continuous telemetry monitoring   Hypokalemia Monitor I&O's / urinary output Follow BMP Ensure adequate renal perfusion Avoid nephrotoxic agents as able Replace electrolytes as indicated  Anxiety Hx: Anxiety, Cocaine abuse Provide supportive care Placed back on home clonazepam, Remeron, Seroquel  Best  Practice: VTE px: iv lovenox SUP px: po pepcid Diet: heart healthy/carb modified    Corrin Parker, M.D.  [facility]  Medical Director [place] Director [place] Department

## 2019-08-29 NOTE — ED Notes (Signed)
RN has called Respiratory Therapy that patient requires suctioning. RT is inroute ASAP.

## 2019-08-29 NOTE — Consult Note (Signed)
PHARMACY CONSULT NOTE - FOLLOW UP  Pharmacy Consult for Electrolyte Monitoring and Replacement   Recent Labs: Potassium (mmol/L)  Date Value  08/29/2019 3.3 (L)  07/11/2014 4.6   Magnesium (mg/dL)  Date Value  08/12/2019 1.9  10/31/2013 2.6 (H)   Calcium (mg/dL)  Date Value  08/29/2019 8.2 (L)   Calcium, Total (mg/dL)  Date Value  07/11/2014 8.9   Albumin (g/dL)  Date Value  08/29/2019 3.3 (L)  07/09/2014 3.7   Phosphorus (mg/dL)  Date Value  08/10/2019 3.0  10/31/2013 4.6   Sodium (mmol/L)  Date Value  08/29/2019 139  07/11/2014 139     Assessment: She was evaluated with cultures showing and growing Pseudomonas only sensitive to cefepime. She came back today with early fevers shortness of breath as well as copious secretions. She has also leukocytosis and early's signs of pneumonia on x-ray. Patient is being admitted with possible tracheitis versus healthcare associated pneumonia. Patient has significant history of recurrent hospitalizations and has a tracheostomy in place.   Goal of Therapy:  WNL  Plan:  Will give KCl 10 mEq x 4.   Oswald Hillock ,PharmD Clinical Pharmacist 08/29/2019 1:24 PM

## 2019-08-29 NOTE — ED Notes (Signed)
Attempted to call report, per ICU they cannot take pt until after shift change

## 2019-08-29 NOTE — Progress Notes (Signed)
PROGRESS NOTE    Michelle [name]  [id number] DOB: 03-Jan-1961 DOA: 08/28/2019 PCP: Frazier Richards, MD   Brief Narrative:   HPI: Michelle [name] is a 59 y.o. female with medical history significant of severe COPD with chronic tracheostomy, CHF, asthma, previous cocaine abuse, hypertension, coronary artery disease, hepatitis C, history of pneumothorax and tobacco abuse, morbid obesity who was recently admitted on March 4 with sepsis and respiratory failure requiring ICU admission.  Patient at the end of the stay was discharged home home with home health.  She was evaluated with cultures showing and growing Pseudomonas only sensitive to cefepime.  She came back today with early fevers shortness of breath as well as copious secretions.  She has also leukocytosis and early's signs of pneumonia on x-ray.  Patient is being admitted with possible tracheitis versus healthcare associated pneumonia.  Patient has significant history of recurrent hospitalizations and has a tracheostomy in place.  She is however fully awake and alert.  3/27: Patient seen and examined.  Still with copious secretions.  Requiring suctioning by RT services.  Remains on cefepime.  Mentating clearly.  No fevers noted since admission.  Assessment & Plan:   Principal Problem:   HCAP (healthcare-associated pneumonia) Active Problems:   Tobacco abuse   Malnutrition of moderate degree (HCC)   Sepsis ([place])   Acute on chronic respiratory failure with hypoxia (HCC)   HTN (hypertension)   DM (diabetes mellitus) (HCC)   Pseudomonas respiratory infection   Hyperlipidemia associated with type 2 diabetes mellitus (HCC)   PEG (percutaneous endoscopic gastrostomy) status ([place])   Hypokalemia   Atypical pneumonia  Tracheitis versus healthcare associated pneumonia Chronic tracheostomy Chronic hypoxic respiratory failure Symptoms suggestive of pneumonia Chest imaging negative Suspect early infection Previous culture with Pseudomonas  sensitive only to cefepime Plan: Admit to stepdown unit Appreciate ICU recommendations Continue cefepime Follow sputum and blood cultures  Diabetes, insulin-dependent Per home med rec patient is on 25 units Lantus twice daily At this time patient's p.o. intake is decreased We will hold off on any long-acting insulin for now  moderate sliding scale coverage Nightly coverage Carb modified diet  Hyperlipidemia Home statin  Hypertension Patient apparently on clonidine patch Currently blood pressure control Add as needed hydralazine  Hypokalemia Replace as needed  Anxiety History of cocaine use Counseled patient Resume home Klonopin, Remeron, Seroquel   DVT prophylaxis: Lovenox Code Status: DNR Family Communication: Left voicemail for daughter Sherisa Tuitt [phone] on 08/29/2018 Disposition Plan: Anticipate return to previous home environment.  Will need resolution of pneumonia prior to formulation of disposition plan   Consultants:   Intensivist- Dr Mortimer Fries  Procedures:   None  Antimicrobials:  Cefepime (3/27-  )   Subjective: Patient seen and examined Remains hemodynamically stable Admit to stepdown unit  Objective: Vitals:   08/29/19 1023 08/29/19 1030 08/29/19 1100 08/29/19 1140  BP: 135/67  (!) 100/54   Pulse: 79 75 72   Resp: 18 18 20    Temp: 99 F (37.2 C)     TempSrc: Oral     SpO2: 100% 98% 100% 100%  Weight: 86.2 kg     Height:        Intake/Output Summary (Last 24 hours) at 08/29/2019 1302 Last data filed at 08/28/2019 2025 Gross per 24 hour  Intake 193.36 ml  Output --  Net 193.36 ml   Filed Weights   08/28/19 1620 08/29/19 1023  Weight: 72.6 kg 86.2 kg    Examination:  General exam: Mild  respiratory distress, appears chronically ill Respiratory system: Coarse basilar rhonchi, mild increased work of breathing, status post tracheostomy Cardiovascular system: Tachycardic, regular rhythm, no murmurs, no pedal edema  gastrointestinal system: Nontender/nondistended, normal bowel sounds  Central nervous system: Alert and oriented. No focal neurological deficits. Extremities: Symmetric 5 x 5 power. Skin: No rashes, lesions or ulcers Psychiatry: Judgement and insight appear normal. Mood & affect appropriate.     Data Reviewed: I have personally reviewed following labs and imaging studies  CBC: Recent Labs  Lab 08/28/19 1635 08/29/19 0212  WBC 12.0* 7.2  NEUTROABS 9.1*  --   HGB 13.8 11.6*  HCT 43.8 37.4  MCV 82.8 84.8  PLT 319 [id number]   Basic Metabolic Panel: Recent Labs  Lab 08/28/19 1635 08/29/19 0212  NA 142 139  K 3.4* 3.3*  CL 108 109  CO2 23 21*  GLUCOSE 120* 137*  BUN 20 21*  CREATININE 0.71 0.73  CALCIUM 9.0 8.2*   GFR: Estimated Creatinine Clearance: 79.7 mL/min (by C-G formula based on SCr of 0.73 mg/dL). Liver Function Tests: Recent Labs  Lab 08/28/19 1635 08/29/19 0212  AST 15 14*  ALT 15 13  ALKPHOS 101 82  BILITOT 0.8 0.8  PROT 7.8 6.6  ALBUMIN 4.0 3.3*   No results for input(s): LIPASE, AMYLASE in the last 168 hours. No results for input(s): AMMONIA in the last 168 hours. Coagulation Profile: Recent Labs  Lab 08/28/19 1635  INR 1.0   Cardiac Enzymes: No results for input(s): CKTOTAL, CKMB, CKMBINDEX, TROPONINI in the last 168 hours. BNP (last 3 results) No results for input(s): PROBNP in the last 8760 hours. HbA1C: No results for input(s): HGBA1C in the last 72 hours. CBG: Recent Labs  Lab 08/28/19 2250 08/29/19 0226 08/29/19 0809 08/29/19 1020  GLUCAP 128* 136* 111* 131*   Lipid Profile: No results for input(s): CHOL, HDL, LDLCALC, TRIG, CHOLHDL, LDLDIRECT in the last 72 hours. Thyroid Function Tests: No results for input(s): TSH, T4TOTAL, FREET4, T3FREE, THYROIDAB in the last 72 hours. Anemia Panel: No results for input(s): VITAMINB12, FOLATE, FERRITIN, TIBC, IRON, RETICCTPCT in the last 72 hours. Sepsis Labs: Recent Labs  Lab  08/28/19 1635  LATICACIDVEN 1.5    Recent Results (from the past 240 hour(s))  Blood Culture (routine x 2)     Status: None (Preliminary result)   Collection Time: 08/28/19  4:35 PM   Specimen: BLOOD  Result Value Ref Range Status   Specimen Description BLOOD R H  Final   Special Requests   Final    BOTTLES DRAWN AEROBIC AND ANAEROBIC Blood Culture results may not be optimal due to an inadequate volume of blood received in culture bottles   Culture   Final    NO GROWTH < 24 HOURS Performed at [facility], [address] [place], [place] [address]    Report Status PENDING  Incomplete  Blood Culture (routine x 2)     Status: None (Preliminary result)   Collection Time: 08/28/19  5:28 PM   Specimen: BLOOD  Result Value Ref Range Status   Specimen Description BLOOD RAC  Final   Special Requests   Final    BOTTLES DRAWN AEROBIC AND ANAEROBIC Blood Culture adequate volume   Culture   Final    NO GROWTH < 24 HOURS Performed at [facility], [address] [place], [place] [address]    Report Status PENDING  Incomplete  Respiratory Panel by RT PCR (Flu A&B, Covid) - Nasopharyngeal Swab  Status: None   Collection Time: 08/28/19 10:08 PM   Specimen: Nasopharyngeal Swab  Result Value Ref Range Status   SARS Coronavirus 2 by RT PCR NEGATIVE NEGATIVE Final    Comment: (NOTE) SARS-CoV-2 target nucleic acids are NOT DETECTED. The SARS-CoV-2 RNA is generally detectable in upper respiratoy specimens during the acute phase of infection. The lowest concentration of SARS-CoV-2 viral copies this assay can detect is 131 copies/mL. A negative result does not preclude SARS-Cov-2 infection and should not be used as the sole basis for treatment or other patient management decisions. A negative result may occur with  improper specimen collection/handling, submission of specimen other than nasopharyngeal swab, presence of viral mutation(s) within the areas targeted by this  assay, and inadequate number of viral copies (<131 copies/mL). A negative result must be combined with clinical observations, patient history, and epidemiological information. The expected result is Negative. Fact Sheet for Patients:  PinkCheek.be Fact Sheet for Healthcare Providers:  GravelBags.it This test is not yet ap proved or cleared by the [place] FDA and  has been authorized for detection and/or diagnosis of SARS-CoV-2 by FDA under an Emergency Use Authorization (EUA). This EUA will remain  in effect (meaning this test can be used) for the duration of the COVID-19 declaration under Section 564(b)(1) of the Act, 21 U.S.C. section 360bbb-3(b)(1), unless the authorization is terminated or revoked sooner.    Influenza A by PCR NEGATIVE NEGATIVE Final   Influenza B by PCR NEGATIVE NEGATIVE Final    Comment: (NOTE) The Xpert Xpress SARS-CoV-2/FLU/RSV assay is intended as an aid in  the diagnosis of influenza from Nasopharyngeal swab specimens and  should not be used as a sole basis for treatment. Nasal washings and  aspirates are unacceptable for Xpert Xpress SARS-CoV-2/FLU/RSV  testing. Fact Sheet for Patients: PinkCheek.be Fact Sheet for Healthcare Providers: GravelBags.it This test is not yet approved or cleared by the [place] FDA and  has been authorized for detection and/or diagnosis of SARS-CoV-2 by  FDA under an Emergency Use Authorization (EUA). This EUA will remain  in effect (meaning this test can be used) for the duration of the  Covid-19 declaration under Section 564(b)(1) of the Act, 21  U.S.C. section 360bbb-3(b)(1), unless the authorization is  terminated or revoked. Performed at [facility], [address] [place], [place] [address]   MRSA PCR Screening     Status: None   Collection Time: 08/29/19  2:12 AM   Specimen:  Nasopharyngeal  Result Value Ref Range Status   MRSA by PCR NEGATIVE NEGATIVE Final    Comment:        The GeneXpert MRSA Assay (FDA approved for NASAL specimens only), is one component of a comprehensive MRSA colonization surveillance program. It is not intended to diagnose MRSA infection nor to guide or monitor treatment for MRSA infections. Performed at [facility], [address] [place], [place] [address]          Radiology Studies: CT ABDOMEN PELVIS W CONTRAST  Result Date: 08/28/2019 CLINICAL DATA:  Nausea and vomiting. Left-sided flank and abdominal pain. EXAM: CT ABDOMEN AND PELVIS WITH CONTRAST TECHNIQUE: Multidetector CT imaging of the abdomen and pelvis was performed using the standard protocol following bolus administration of intravenous contrast. CONTRAST:  13mL OMNIPAQUE IOHEXOL 300 MG/ML  SOLN COMPARISON:  Abdominal radiograph 08/07/2019 FINDINGS: Lower chest: Emphysema. Linear opacity in the right lower lobe favors atelectasis. Heart is normal in size. Hepatobiliary: Decreased hepatic density consistent with steatosis. More focal  fatty infiltration adjacent to the falciform ligament. No discrete focal hepatic lesion. Gallbladder physiologically distended. No calcified gallstone or pericholecystic inflammation. There is mild extrahepatic biliary ductal dilatation with common bile duct measuring 8 mm distally. Pancreas: No acute peripancreatic inflammation. There is proximal pancreatic ductal dilatation at 6 mm. No evidence of pancreatic cyst or focal lesion. Spleen: Normal in size without focal abnormality. Adrenals/Urinary Tract: No adrenal nodule. No hydronephrosis or perinephric edema. Homogeneous renal enhancement with symmetric excretion on delayed phase imaging. 16 mm cyst in the mid right kidney. Additional tiny hypodensity in the lower right kidney is incompletely characterized due to size. Urinary bladder is physiologically distended without wall  thickening. Stomach/Bowel: Gastrostomy tube appropriately positioned in the stomach. No abnormality along the gastrostomy tube tract. Stomach is decompressed and unremarkable. No small bowel inflammation or obstruction. Normal appendix. Small to moderate colonic stool burden. No colonic wall thickening or inflammation. No significant diverticular disease. Vascular/Lymphatic: Aorto bi-iliac atherosclerosis, moderate but advanced for age. No aortic aneurysm. The portal vein is patent. No acute vascular findings. No abdominopelvic adenopathy. Reproductive: Bilateral tubal ligation clips. Uterus unremarkable. Ovaries quiescent. No adnexal mass. Other: No free air, free fluid, or intra-abdominal fluid collection. Small fat containing umbilical hernia. Soft tissue densities in the lower anterior abdominal wall typical of medication injection sites. Musculoskeletal: There are no acute or suspicious osseous abnormalities. Degenerative disc disease at L5-S1. IMPRESSION: 1. Mild extrahepatic biliary ductal dilatation and proximal pancreatic ductal dilatation, of uncertain etiology and chronicity. No significant gallbladder dilatation or evidence of focal pancreatic or obstructing lesion. Recommend correlation with LFTs. If elevated, recommend further evaluation with MRCP or ERCP. MRCP should only be considered if patient is able to tolerate breath hold technique, and could be performed on an elective outpatient basis. 2. No additional acute abnormality in the abdomen/pelvis. 3. Hepatic steatosis. 4. Aorto bi-iliac atherosclerosis, age advanced. 5. Gastrostomy tube appropriately positioned in the stomach. Aortic Atherosclerosis (ICD10-I70.0). Electronically Signed   By: Keith Rake M.D.   On: 08/28/2019 19:20   DG Chest Portable 1 View  Result Date: 08/28/2019 CLINICAL DATA:  Difficulty breathing. EXAM: PORTABLE CHEST 1 VIEW COMPARISON:  August 06, 2019 FINDINGS: There is stable tracheostomy tube positioning. The lungs  are hyperinflated. There is no evidence of acute infiltrate, pleural effusion or pneumothorax. The heart size and mediastinal contours are within normal limits. The visualized skeletal structures are unremarkable. IMPRESSION: No active disease. Electronically Signed   By: Virgina Norfolk M.D.   On: 08/28/2019 17:31        Scheduled Meds: . budesonide  0.5 mg Nebulization BID  . chlorhexidine  15 mL Mouth Rinse BID  . Chlorhexidine Gluconate Cloth  6 each Topical Daily  . clonazePAM  1.5 mg Oral TID  . enoxaparin (LOVENOX) injection  40 mg Subcutaneous Q24H  . famotidine  20 mg Per Tube BID  . insulin aspart  0-15 Units Subcutaneous TID WC  . insulin aspart  0-5 Units Subcutaneous QHS  . ipratropium-albuterol  3 mL Nebulization Q4H  . mouth rinse  15 mL Mouth Rinse 10 times per day  . mirtazapine  30 mg Oral QHS  . QUEtiapine  100 mg Per Tube QHS   Continuous Infusions: . sodium chloride 100 mL/hr at 08/29/19 0307  . ceFEPime (MAXIPIME) IV Stopped (08/29/19 1055)     LOS: 1 day    Time spent: 35 minutes    Sidney Ace, MD [facility] Pager [phone]  If 7PM-7AM, please contact night-coverage  08/29/2019, 1:02 PM

## 2019-08-29 NOTE — ED Notes (Signed)
Patient has been suctioned by RN.

## 2019-08-30 LAB — MAGNESIUM: Magnesium: 1.9 mg/dL (ref 1.7–2.4)

## 2019-08-30 LAB — URINE CULTURE: Culture: NO GROWTH

## 2019-08-30 LAB — CBC WITH DIFFERENTIAL/PLATELET
Abs Immature Granulocytes: 0.06 10*3/uL (ref 0.00–0.07)
Basophils Absolute: 0 10*3/uL (ref 0.0–0.1)
Basophils Relative: 0 %
Eosinophils Absolute: 0 10*3/uL (ref 0.0–0.5)
Eosinophils Relative: 0 %
HCT: 35.9 % — ABNORMAL LOW (ref 36.0–46.0)
Hemoglobin: 11.1 g/dL — ABNORMAL LOW (ref 12.0–15.0)
Immature Granulocytes: 1 %
Lymphocytes Relative: 6 %
Lymphs Abs: 0.5 10*3/uL — ABNORMAL LOW (ref 0.7–4.0)
MCH: 26.1 pg (ref 26.0–34.0)
MCHC: 30.9 g/dL (ref 30.0–36.0)
MCV: 84.3 fL (ref 80.0–100.0)
Monocytes Absolute: 0.2 10*3/uL (ref 0.1–1.0)
Monocytes Relative: 2 %
Neutro Abs: 6.8 10*3/uL (ref 1.7–7.7)
Neutrophils Relative %: 91 %
Platelets: 194 10*3/uL (ref 150–400)
RBC: 4.26 MIL/uL (ref 3.87–5.11)
RDW: 16 % — ABNORMAL HIGH (ref 11.5–15.5)
WBC: 7.5 10*3/uL (ref 4.0–10.5)
nRBC: 0 % (ref 0.0–0.2)

## 2019-08-30 LAB — BASIC METABOLIC PANEL
Anion gap: 11 (ref 5–15)
BUN: 12 mg/dL (ref 6–20)
CO2: 19 mmol/L — ABNORMAL LOW (ref 22–32)
Calcium: 8.5 mg/dL — ABNORMAL LOW (ref 8.9–10.3)
Chloride: 112 mmol/L — ABNORMAL HIGH (ref 98–111)
Creatinine, Ser: 0.63 mg/dL (ref 0.44–1.00)
GFR calc Af Amer: >60 mL/min (ref >60)
GFR calc non Af Amer: >60 mL/min (ref >60)
Glucose, Bld: 200 mg/dL — ABNORMAL HIGH (ref 70–99)
Potassium: 4 mmol/L (ref 3.5–5.1)
Sodium: 142 mmol/L (ref 135–145)

## 2019-08-30 LAB — GLUCOSE, CAPILLARY
Glucose-Capillary: 182 mg/dL — ABNORMAL HIGH (ref 70–99)
Glucose-Capillary: 181 mg/dL — ABNORMAL HIGH (ref 70–99)
Glucose-Capillary: 187 mg/dL — ABNORMAL HIGH (ref 70–99)
Glucose-Capillary: 258 mg/dL — ABNORMAL HIGH (ref 70–99)

## 2019-08-30 LAB — PHOSPHORUS: Phosphorus: 1.5 mg/dL — ABNORMAL LOW (ref 2.5–4.6)

## 2019-08-30 MED ORDER — METHYLPREDNISOLONE SODIUM SUCC 40 MG IJ SOLR
40.0000 mg | INTRAMUSCULAR | Status: AC
  Administered 2019-08-30: 40 mg via INTRAVENOUS
  Filled 2019-08-30: qty 1

## 2019-08-30 MED ORDER — CLONIDINE HCL 0.1 MG/24HR TD PTWK
0.1000 mg | MEDICATED_PATCH | TRANSDERMAL | Status: DC
  Administered 2019-08-30: 0.1 mg via TRANSDERMAL
  Filled 2019-08-30 (×2): qty 1

## 2019-08-30 MED ORDER — CLONAZEPAM 1 MG PO TABS
1.0000 mg | ORAL_TABLET | Freq: Once | ORAL | Status: AC
  Administered 2019-08-30: 1 mg
  Filled 2019-08-30: qty 1

## 2019-08-30 MED ORDER — ACETAMINOPHEN 325 MG PO TABS
650.0000 mg | ORAL_TABLET | Freq: Four times a day (QID) | ORAL | Status: DC | PRN
  Administered 2019-08-30 – 2019-09-01 (×3): 650 mg via ORAL
  Filled 2019-08-30 (×3): qty 2

## 2019-08-30 NOTE — Consult Note (Signed)
Name: EMBRIE MIKKELSEN MRN: [id number] DOB: 1961/01/10    ADMISSION DATE:  08/28/2019 CONSULTATION DATE: 08/29/2019  REFERRING MD : Sharion Settler, NP  CHIEF COMPLAINT: Shortness of Breath   BRIEF PATIENT DESCRIPTION:  59 year old female, DNR/DNI, with past medical history notable for severe COPD requiring chronic home vent via chronic tracheostomy admitted with acute on chronic respiratory failure secondary to trachitis vs. recurrent pneumonia   SIGNIFICANT EVENTS/STUDIES:  03/26: Pt initially admitted to the telemetry unit with pneumonia, but due to chronic home vent requirements pt changed to stepdown status and PCCM consulted 03/27 3/27 febrile episode  HISTORY OF PRESENT ILLNESS:   CHRONIC HOME VENT S/p trach Alert and awake intermittent agitation   PAST MEDICAL HISTORY :   has a past medical history of Allergy, Anxiety, Asthma, CHF (congestive heart failure) ([place]), Cocaine abuse ([place]), COPD (chronic obstructive pulmonary disease) ([place]), Coronary artery disease, Emphysema/COPD ([place]), Hepatitis C, High cholesterol, Hypertension, Pneumothorax, and Tobacco abuse.  has a past surgical history that includes Cardiac catheterization; Chest tube insertion; Tracheostomy tube placement (N/A, 10/17/2017); and IR GASTROSTOMY TUBE MOD SED (10/31/2017). Prior to Admission medications   Medication Sig Start Date End Date Taking? Authorizing Provider  aspirin 81 MG chewable tablet Chew 81 mg by mouth daily.    Yes [provider]  budesonide (PULMICORT) 0.25 MG/2ML nebulizer solution Take 2 mLs (0.25 mg total) by nebulization 2 (two) times daily. Patient taking differently: Take 0.5 mg by nebulization 2 (two) times daily.  12/31/17  Yes Conforti, John, DO  cetirizine (ZYRTEC) 10 MG tablet Place 10 mg into feeding tube daily as needed.    Yes [provider]  clonazePAM (KLONOPIN) 0.5 MG tablet Take 3 tablets (1.5 mg total) by mouth 3 (three) times daily. Patient taking  differently: Take 1.5 mg by mouth 3 (three) times daily.  07/03/19  Yes Nicole Kindred A, DO  cloNIDine (CATAPRES - DOSED IN MG/24 HR) 0.1 mg/24hr patch Place 0.1 mg onto the skin once a week.   Yes [provider]  famotidine (PEPCID) 20 MG tablet Place 1 tablet (20 mg total) into feeding tube daily. 12/31/17  Yes Conforti, John, DO  fluticasone (FLONASE) 50 MCG/ACT nasal spray Place 1 spray into both nostrils daily as needed.    Yes [provider]  guaiFENesin (ROBITUSSIN) 100 MG/5ML SOLN Take 5 mLs (100 mg total) by mouth every 4 (four) hours as needed for cough or to loosen phlegm. 08/12/19  Yes Fadil Macmaster, Maretta Bees, MD  guaiFENesin-dextromethorphan (ROBITUSSIN DM) 100-10 MG/5ML syrup Take 10 mLs by mouth every 6 (six) hours as needed for cough. Patient taking differently: Take 5 mLs by mouth every 6 (six) hours as needed for cough.  07/03/19  Yes Nicole Kindred A, DO  insulin aspart (NOVOLOG) 100 UNIT/ML injection Inject 0-20 Units into the skin 3 (three) times daily with meals. 07/05/19 08/28/19 Yes Nicole Kindred A, DO  insulin detemir (LEVEMIR) 100 UNIT/ML injection Inject 0.25 mLs (25 Units total) into the skin 2 (two) times daily. 07/05/19 08/28/19 Yes Nicole Kindred A, DO  ipratropium-albuterol (DUONEB) 0.5-2.5 (3) MG/3ML SOLN Take 3 mLs by nebulization every 6 (six) hours. Patient taking differently: Take 3 mLs by nebulization every 6 (six) hours as needed (for shortness of breath).  12/31/17  Yes Conforti, John, DO  mirtazapine (REMERON SOL-TAB) 30 MG disintegrating tablet Take 1 tablet (30 mg total) by mouth at bedtime. 07/05/19  Yes Nicole Kindred A, DO  oxyCODONE-acetaminophen (PERCOCET/ROXICET) 5-325 MG tablet Take 1 tablet by mouth  every 8 (eight) hours as needed.   Yes [provider]  PARoxetine (PAXIL) 20 MG tablet Place 1 tablet (20 mg total) into feeding tube at bedtime. Patient taking differently: Place 20 mg into feeding tube daily.  12/31/17  Yes Conforti, John,  DO  polyethylene glycol (MIRALAX / GLYCOLAX) 17 g packet Place 17 g into feeding tube daily as needed.    Yes [provider]  QUEtiapine (SEROQUEL) 100 MG tablet Place 1 tablet (100 mg total) into feeding tube at bedtime. 07/03/19  Yes Ezekiel Slocumb, DO  senna (SENOKOT) 8.6 MG TABS tablet Place 1-2 tablets into feeding tube at bedtime as needed for mild constipation.    Yes [provider]  blood glucose meter kit and supplies KIT Dispense based on patient and insurance preference. Use up to four times daily as directed. (FOR ICD-9 250.00, 250.01). 07/05/19   Ezekiel Slocumb, DO  Ensure Max Protein (ENSURE MAX PROTEIN) LIQD Take 330 mLs (11 oz total) by mouth 2 (two) times daily. 07/03/19   Nicole Kindred A, DO  ferrous sulfate 325 (65 FE) MG tablet Take 325 mg by mouth every other day.    [provider]  Insulin Pen Needle (PEN NEEDLES) 32G X 6 MM MISC 1 each by Does not apply route 4 (four) times daily - after meals and at bedtime. 07/05/19   Ezekiel Slocumb, DO  levofloxacin (LEVAQUIN) 750 MG tablet Place 1 tablet (750 mg total) into feeding tube daily at 6 PM. Patient not taking: Reported on 08/28/2019 08/15/19   Tyler Pita, MD  predniSONE (DELTASONE) 20 MG tablet Take 1 tablet (20 mg total) by mouth daily with breakfast. 10 days Patient not taking: Reported on 08/28/2019 08/15/19   Tyler Pita, MD   Allergies  Allergen Reactions  . Amoxicillin     Patient has tolerated cephalosporins in the past  . Ativan [Lorazepam]     Makes anxiety worse     Review of Systems:  Gen:  Denies  fever, sweats, chills weight loss  HEENT: Denies blurred vision, double vision, ear pain, eye pain, hearing loss, nose bleeds, sore throat Cardiac:  No dizziness, chest pain or heaviness, chest tightness,edema, No JVD Resp:  +shortness of breath,-wheezing, -hemoptysis,  Gi: Denies swallowing difficulty, stomach pain, nausea or vomiting, diarrhea, constipation, bowel  incontinence Other:  All other systems negative  SUBJECTIVE:   VITAL SIGNS: Temp:  [98 F (36.7 C)-100.4 F (38 C)] 100.4 F (38 C) (03/28 0715) Pulse Rate:  [59-127] 117 (03/28 0715) Resp:  [18-24] 24 (03/28 0715) BP: (82-220)/(52-106) 123/93 (03/28 0715) SpO2:  [95 %-100 %] 100 % (03/28 0715) FiO2 (%):  [36 %] 36 % (03/28 0340) Weight:  [86.2 kg] 86.2 kg (03/27 1023)   Physical Examination:   General Appearance: No distress  S/p trach Neuro:without focal findings,  speech normal,  HEENT: PERRLA, EOM intact.   Pulmonary: normal breath sounds, No wheezing.  CardiovascularNormal S1,S2.  No m/r/g.   Abdomen: Benign, Soft, non-tender. Renal:  No costovertebral tenderness  GU:  Not performed at this time. Endoc: No evident thyromegaly Skin:   warm, no rashes, no ecchymosis  Extremities: normal, no cyanosis, clubbing. PSYCHIATRIC: Mood, affect within normal limits.   ALL OTHER ROS ARE NEGATIVE     Recent Labs  Lab 08/28/19 1635 08/29/19 0212  NA 142 139  K 3.4* 3.3*  CL 108 109  CO2 23 21*  BUN 20 21*  CREATININE 0.71 0.73  GLUCOSE  120* 137*   Recent Labs  Lab 08/28/19 1635 08/29/19 0212  HGB 13.8 11.6*  HCT 43.8 37.4  WBC 12.0* 7.2  PLT 319 228   CT ABDOMEN PELVIS W CONTRAST  Result Date: 08/28/2019 CLINICAL DATA:  Nausea and vomiting. Left-sided flank and abdominal pain. EXAM: CT ABDOMEN AND PELVIS WITH CONTRAST TECHNIQUE: Multidetector CT imaging of the abdomen and pelvis was performed using the standard protocol following bolus administration of intravenous contrast. CONTRAST:  147m OMNIPAQUE IOHEXOL 300 MG/ML  SOLN COMPARISON:  Abdominal radiograph 08/07/2019 FINDINGS: Lower chest: Emphysema. Linear opacity in the right lower lobe favors atelectasis. Heart is normal in size. Hepatobiliary: Decreased hepatic density consistent with steatosis. More focal fatty infiltration adjacent to the falciform ligament. No discrete focal hepatic lesion. Gallbladder  physiologically distended. No calcified gallstone or pericholecystic inflammation. There is mild extrahepatic biliary ductal dilatation with common bile duct measuring 8 mm distally. Pancreas: No acute peripancreatic inflammation. There is proximal pancreatic ductal dilatation at 6 mm. No evidence of pancreatic cyst or focal lesion. Spleen: Normal in size without focal abnormality. Adrenals/Urinary Tract: No adrenal nodule. No hydronephrosis or perinephric edema. Homogeneous renal enhancement with symmetric excretion on delayed phase imaging. 16 mm cyst in the mid right kidney. Additional tiny hypodensity in the lower right kidney is incompletely characterized due to size. Urinary bladder is physiologically distended without wall thickening. Stomach/Bowel: Gastrostomy tube appropriately positioned in the stomach. No abnormality along the gastrostomy tube tract. Stomach is decompressed and unremarkable. No small bowel inflammation or obstruction. Normal appendix. Small to moderate colonic stool burden. No colonic wall thickening or inflammation. No significant diverticular disease. Vascular/Lymphatic: Aorto bi-iliac atherosclerosis, moderate but advanced for age. No aortic aneurysm. The portal vein is patent. No acute vascular findings. No abdominopelvic adenopathy. Reproductive: Bilateral tubal ligation clips. Uterus unremarkable. Ovaries quiescent. No adnexal mass. Other: No free air, free fluid, or intra-abdominal fluid collection. Small fat containing umbilical hernia. Soft tissue densities in the lower anterior abdominal wall typical of medication injection sites. Musculoskeletal: There are no acute or suspicious osseous abnormalities. Degenerative disc disease at L5-S1. IMPRESSION: 1. Mild extrahepatic biliary ductal dilatation and proximal pancreatic ductal dilatation, of uncertain etiology and chronicity. No significant gallbladder dilatation or evidence of focal pancreatic or obstructing lesion. Recommend  correlation with LFTs. If elevated, recommend further evaluation with MRCP or ERCP. MRCP should only be considered if patient is able to tolerate breath hold technique, and could be performed on an elective outpatient basis. 2. No additional acute abnormality in the abdomen/pelvis. 3. Hepatic steatosis. 4. Aorto bi-iliac atherosclerosis, age advanced. 5. Gastrostomy tube appropriately positioned in the stomach. Aortic Atherosclerosis (ICD10-I70.0). Electronically Signed   By: MKeith RakeM.D.   On: 08/28/2019 19:20   DG Chest Portable 1 View  Result Date: 08/28/2019 CLINICAL DATA:  Difficulty breathing. EXAM: PORTABLE CHEST 1 VIEW COMPARISON:  August 06, 2019 FINDINGS: There is stable tracheostomy tube positioning. The lungs are hyperinflated. There is no evidence of acute infiltrate, pleural effusion or pneumothorax. The heart size and mediastinal contours are within normal limits. The visualized skeletal structures are unremarkable. IMPRESSION: No active disease. Electronically Signed   By: TVirgina NorfolkM.D.   On: 08/28/2019 17:31    ASSESSMENT / PLAN:  SEVERE COPD EXACERBATION from pneumonia/VENT FROM HOME -continue IV steroids as prescribed -continue NEB THERAPY as prescribed -morphine as needed -wean fio2 as needed and tolerated CONTINUE IV ABX  ACUTE ON CHRONIC RESP FAILURE FROM COPD EXACERBATION  ANXIETY PRN MEDS AVOID PRECEDEX  Best Practice: VTE px: iv lovenox SUP px: po pepcid Diet: heart healthy/carb modified    Corrin Parker, M.D.  [facility]  Medical Director [place] Director [facility]

## 2019-08-30 NOTE — Consult Note (Signed)
PHARMACY CONSULT NOTE - FOLLOW UP  Pharmacy Consult for Electrolyte Monitoring and Replacement   Recent Labs: Potassium (mmol/L)  Date Value  08/29/2019 3.3 (L)  07/11/2014 4.6   Magnesium (mg/dL)  Date Value  08/12/2019 1.9  10/31/2013 2.6 (H)   Calcium (mg/dL)  Date Value  08/29/2019 8.2 (L)   Calcium, Total (mg/dL)  Date Value  07/11/2014 8.9   Albumin (g/dL)  Date Value  08/29/2019 3.3 (L)  07/09/2014 3.7   Phosphorus (mg/dL)  Date Value  08/10/2019 3.0  10/31/2013 4.6   Sodium (mmol/L)  Date Value  08/29/2019 139  07/11/2014 139     Assessment: She was evaluated with cultures showing and growing Pseudomonas only sensitive to cefepime. She came back today with early fevers shortness of breath as well as copious secretions. She has also leukocytosis and early's signs of pneumonia on x-ray. Patient is being admitted with possible tracheitis versus healthcare associated pneumonia. Patient has significant history of recurrent hospitalizations and has a tracheostomy in place.   Goal of Therapy:  WNL  Plan:  Pt refused AM labs. Pt received 40 mEq of KCl 3/27.   Oswald Hillock ,PharmD, BCPS Clinical Pharmacist 08/30/2019 7:44 AM

## 2019-08-30 NOTE — Progress Notes (Addendum)
Pharmacy Antibiotic Note  Michelle [name] is a 59 y.o. female admitted on 08/28/2019 with pneumonia.  Pharmacy has been consulted for Cefepime dosing.  Plan: Day 3 of abx. Cefepime 2g IV q8h  Height: 5\' 3"  (160 cm) Weight: 190 lb 0.6 oz (86.2 kg) IBW/kg (Calculated) : 52.4  Temp (24hrs), Avg:99 F (37.2 C), Min:98 F (36.7 C), Max:100.4 F (38 C)  Recent Labs  Lab 08/28/19 1635 08/29/19 0212  WBC 12.0* 7.2  CREATININE 0.71 0.73  LATICACIDVEN 1.5  --     Estimated Creatinine Clearance: 79.7 mL/min (by C-G formula based on SCr of 0.73 mg/dL).    Allergies  Allergen Reactions  . Amoxicillin     Patient has tolerated cephalosporins in the past  . Ativan [Lorazepam]     Makes anxiety worse     Antimicrobials this admission: Vancomycin 3/26 x 1 in ED  Cefepime 3/26 >>   Dose adjustments this admission:  Microbiology results:  Thank you for allowing pharmacy to be a part of this patient's care.  Eleonore Chiquito, PharmD, BCPS 08/30/2019 7:46 AM

## 2019-08-30 NOTE — Progress Notes (Signed)
PROGRESS NOTE    DILLYNN SCHULLO  [id number] DOB: 07/26/1960 DOA: 08/28/2019 PCP: Frazier Richards, MD   Brief Narrative:   HPI: RESSIE DIAMOND is a 59 y.o. female with medical history significant of severe COPD with chronic tracheostomy, CHF, asthma, previous cocaine abuse, hypertension, coronary artery disease, hepatitis C, history of pneumothorax and tobacco abuse, morbid obesity who was recently admitted on March 4 with sepsis and respiratory failure requiring ICU admission.  Patient at the end of the stay was discharged home home with home health.  She was evaluated with cultures showing and growing Pseudomonas only sensitive to cefepime.  She came back today with early fevers shortness of breath as well as copious secretions.  She has also leukocytosis and early's signs of pneumonia on x-ray.  Patient is being admitted with possible tracheitis versus healthcare associated pneumonia.  Patient has significant history of recurrent hospitalizations and has a tracheostomy in place.  She is however fully awake and alert.  3/27: Patient seen and examined.  Still with copious secretions.  Requiring suctioning by RT services.  Remains on cefepime.  Mentating clearly.  No fevers noted since admission.  3/28: Patient seen and examined.  Does not endorse improvement in symptoms.  Still with copious secretions.  Lung sounds are relatively clear.  Mentation appears at baseline.  No fevers noted.  Assessment & Plan:   Principal Problem:   HCAP (healthcare-associated pneumonia) Active Problems:   Tobacco abuse   Malnutrition of moderate degree (HCC)   Sepsis ([place])   Acute on chronic respiratory failure with hypoxia (HCC)   HTN (hypertension)   DM (diabetes mellitus) (HCC)   Pseudomonas respiratory infection   Hyperlipidemia associated with type 2 diabetes mellitus (HCC)   PEG (percutaneous endoscopic gastrostomy) status ([place])   Hypokalemia   Atypical pneumonia  Tracheitis versus healthcare  associated pneumonia Chronic tracheostomy Chronic hypoxic respiratory failure Symptoms suggestive of pneumonia Chest imaging negative Suspect early infection Previous culture with Pseudomonas sensitive only to cefepime Plan: Continue stepdown status Appreciate ICU recommendations Continue cefepime Follow sputum and blood cultures  Diabetes, insulin-dependent Per home med rec patient is on 25 units Lantus twice daily At this time patient's p.o. intake is decreased We will hold off on any long-acting insulin for now  moderate sliding scale coverage Nightly coverage Carb modified diet  Hyperlipidemia Home statin  Hypertension Acutely worsened this morning Replace clonidine patch As needed hydralazine   Hypokalemia Replace as needed  Anxiety History of cocaine use Counseled patient Resume home Klonopin, Remeron, Seroquel   DVT prophylaxis: Lovenox Code Status: DNR Family Communication: Left voicemail for daughter Inas Korhonen [phone] on 08/29/2018 Disposition Plan: Anticipate return to previous home environment.  Will need resolution of pneumonia prior to formulation of disposition plan   Consultants:   Intensivist- Dr Mortimer Fries  Procedures:   None  Antimicrobials:  Cefepime (3/27-  )   Subjective: Patient seen and examined Remains hemodynamically stable Remains in stepdown unit  Objective: Vitals:   08/30/19 1141 08/30/19 1200 08/30/19 1230 08/30/19 1300  BP:  131/61  114/63  Pulse:  78 75 71  Resp:  20 20 20   Temp: 99.3 F (37.4 C)     TempSrc: Oral     SpO2:  100% 100% 100%  Weight:      Height:        Intake/Output Summary (Last 24 hours) at 08/30/2019 1411 Last data filed at 08/30/2019 1300 Gross per 24 hour  Intake 3819.52 ml  Output 2850  ml  Net 969.52 ml   Filed Weights   08/28/19 1620 08/29/19 1023  Weight: 72.6 kg 86.2 kg    Examination:  General exam: Mild respiratory distress, appears chronically ill Respiratory system:  Coarse basilar rhonchi, mild increased work of breathing, status post tracheostomy Cardiovascular system: Tachycardic, regular rhythm, no murmurs, no pedal edema gastrointestinal system: Nontender/nondistended, normal bowel sounds  Central nervous system: Alert and oriented. No focal neurological deficits. Extremities: Symmetric 5 x 5 power. Skin: No rashes, lesions or ulcers Psychiatry: Judgement and insight appear normal. Mood & affect appropriate.     Data Reviewed: I have personally reviewed following labs and imaging studies  CBC: Recent Labs  Lab 08/28/19 1635 08/29/19 0212  WBC 12.0* 7.2  NEUTROABS 9.1*  --   HGB 13.8 11.6*  HCT 43.8 37.4  MCV 82.8 84.8  PLT 319 [id number]   Basic Metabolic Panel: Recent Labs  Lab 08/28/19 1635 08/29/19 0212  NA 142 139  K 3.4* 3.3*  CL 108 109  CO2 23 21*  GLUCOSE 120* 137*  BUN 20 21*  CREATININE 0.71 0.73  CALCIUM 9.0 8.2*   GFR: Estimated Creatinine Clearance: 79.7 mL/min (by C-G formula based on SCr of 0.73 mg/dL). Liver Function Tests: Recent Labs  Lab 08/28/19 1635 08/29/19 0212  AST 15 14*  ALT 15 13  ALKPHOS 101 82  BILITOT 0.8 0.8  PROT 7.8 6.6  ALBUMIN 4.0 3.3*   No results for input(s): LIPASE, AMYLASE in the last 168 hours. No results for input(s): AMMONIA in the last 168 hours. Coagulation Profile: Recent Labs  Lab 08/28/19 1635  INR 1.0   Cardiac Enzymes: No results for input(s): CKTOTAL, CKMB, CKMBINDEX, TROPONINI in the last 168 hours. BNP (last 3 results) No results for input(s): PROBNP in the last 8760 hours. HbA1C: Recent Labs    08/29/19 0212  HGBA1C 8.6*   CBG: Recent Labs  Lab 08/29/19 1020 08/29/19 1613 08/29/19 2222 08/30/19 0808 08/30/19 1139  GLUCAP 131* 128* 149* 182* 181*   Lipid Profile: No results for input(s): CHOL, HDL, LDLCALC, TRIG, CHOLHDL, LDLDIRECT in the last 72 hours. Thyroid Function Tests: No results for input(s): TSH, T4TOTAL, FREET4, T3FREE, THYROIDAB in the  last 72 hours. Anemia Panel: No results for input(s): VITAMINB12, FOLATE, FERRITIN, TIBC, IRON, RETICCTPCT in the last 72 hours. Sepsis Labs: Recent Labs  Lab 08/28/19 1635  LATICACIDVEN 1.5    Recent Results (from the past 240 hour(s))  Blood Culture (routine x 2)     Status: None (Preliminary result)   Collection Time: 08/28/19  4:35 PM   Specimen: BLOOD  Result Value Ref Range Status   Specimen Description BLOOD R H  Final   Special Requests   Final    BOTTLES DRAWN AEROBIC AND ANAEROBIC Blood Culture results may not be optimal due to an inadequate volume of blood received in culture bottles   Culture   Final    NO GROWTH 2 DAYS Performed at [facility] [address] [place], [place] [address]    Report Status PENDING  Incomplete  Urine culture     Status: None   Collection Time: 08/28/19  5:12 PM   Specimen: In/Out Cath Urine  Result Value Ref Range Status   Specimen Description   Final    IN/OUT CATH URINE Performed at [facility], [address], [place], [place] [address]    Special Requests   Final    NONE Performed at [facility], [place]  [address], [place], [place] [address]    Culture   Final    NO GROWTH Performed at [facility], [place] [address], [place], [place] [address]    Report Status 08/30/2019 FINAL  Final  Culture, respiratory (non-expectorated)     Status: None (Preliminary result)   Collection Time: 08/28/19  5:12 PM   Specimen: Tracheal Aspirate; Respiratory  Result Value Ref Range Status   Specimen Description   Final    TRACHEAL ASPIRATE Performed at [facility], [address] [place], [place] [address]    Special Requests   Final    Normal Performed at [facility], [address] [place], [place] [address]    Gram Stain   Final    FEW WBC PRESENT,BOTH PMN AND MONONUCLEAR FEW SQUAMOUS EPITHELIAL CELLS PRESENT ABUNDANT GRAM NEGATIVE RODS FEW GRAM POSITIVE COCCI IN  CLUSTERS FEW GRAM POSITIVE RODS FEW YEAST    Culture   Final    ABUNDANT PSEUDOMONAS AERUGINOSA SUSCEPTIBILITIES TO FOLLOW CULTURE REINCUBATED FOR BETTER GROWTH Performed at [facility], [place] [address] [place], [place] [address]    Report Status PENDING  Incomplete  Blood Culture (routine x 2)     Status: None (Preliminary result)   Collection Time: 08/28/19  5:28 PM   Specimen: BLOOD  Result Value Ref Range Status   Specimen Description BLOOD RAC  Final   Special Requests   Final    BOTTLES DRAWN AEROBIC AND ANAEROBIC Blood Culture adequate volume   Culture   Final    NO GROWTH 2 DAYS Performed at [facility], [address], [place], [place] [address]    Report Status PENDING  Incomplete  Respiratory Panel by RT PCR (Flu A&B, Covid) - Nasopharyngeal Swab     Status: None   Collection Time: 08/28/19 10:08 PM   Specimen: Nasopharyngeal Swab  Result Value Ref Range Status   SARS Coronavirus 2 by RT PCR NEGATIVE NEGATIVE Final    Comment: (NOTE) SARS-CoV-2 target nucleic acids are NOT DETECTED. The SARS-CoV-2 RNA is generally detectable in upper respiratoy specimens during the acute phase of infection. The lowest concentration of SARS-CoV-2 viral copies this assay can detect is 131 copies/mL. A negative result does not preclude SARS-Cov-2 infection and should not be used as the sole basis for treatment or other patient management decisions. A negative result may occur with  improper specimen collection/handling, submission of specimen other than nasopharyngeal swab, presence of viral mutation(s) within the areas targeted by this assay, and inadequate number of viral copies (<131 copies/mL). A negative result must be combined with clinical observations, patient history, and epidemiological information. The expected result is Negative. Fact Sheet for Patients:  PinkCheek.be Fact Sheet for Healthcare Providers:   GravelBags.it This test is not yet ap proved or cleared by the [place] FDA and  has been authorized for detection and/or diagnosis of SARS-CoV-2 by FDA under an Emergency Use Authorization (EUA). This EUA will remain  in effect (meaning this test can be used) for the duration of the COVID-19 declaration under Section 564(b)(1) of the Act, 21 U.S.C. section 360bbb-3(b)(1), unless the authorization is terminated or revoked sooner.    Influenza A by PCR NEGATIVE NEGATIVE Final   Influenza B by PCR NEGATIVE NEGATIVE Final    Comment: (NOTE) The Xpert Xpress SARS-CoV-2/FLU/RSV assay is intended as an aid in  the diagnosis of influenza from Nasopharyngeal swab specimens and  should not be used as a sole basis for  treatment. Nasal washings and  aspirates are unacceptable for Xpert Xpress SARS-CoV-2/FLU/RSV  testing. Fact Sheet for Patients: PinkCheek.be Fact Sheet for Healthcare Providers: GravelBags.it This test is not yet approved or cleared by the [place] FDA and  has been authorized for detection and/or diagnosis of SARS-CoV-2 by  FDA under an Emergency Use Authorization (EUA). This EUA will remain  in effect (meaning this test can be used) for the duration of the  Covid-19 declaration under Section 564(b)(1) of the Act, 21  U.S.C. section 360bbb-3(b)(1), unless the authorization is  terminated or revoked. Performed at [facility], [address] [place], [place] [address]   MRSA PCR Screening     Status: None   Collection Time: 08/29/19  2:12 AM   Specimen: Nasopharyngeal  Result Value Ref Range Status   MRSA by PCR NEGATIVE NEGATIVE Final    Comment:        The GeneXpert MRSA Assay (FDA approved for NASAL specimens only), is one component of a comprehensive MRSA colonization surveillance program. It is not intended to diagnose MRSA infection nor to guide or monitor  treatment for MRSA infections. Performed at [facility], [address], [place], [place] [address]          Radiology Studies: CT ABDOMEN PELVIS W CONTRAST  Result Date: 08/28/2019 CLINICAL DATA:  Nausea and vomiting. Left-sided flank and abdominal pain. EXAM: CT ABDOMEN AND PELVIS WITH CONTRAST TECHNIQUE: Multidetector CT imaging of the abdomen and pelvis was performed using the standard protocol following bolus administration of intravenous contrast. CONTRAST:  150mL OMNIPAQUE IOHEXOL 300 MG/ML  SOLN COMPARISON:  Abdominal radiograph 08/07/2019 FINDINGS: Lower chest: Emphysema. Linear opacity in the right lower lobe favors atelectasis. Heart is normal in size. Hepatobiliary: Decreased hepatic density consistent with steatosis. More focal fatty infiltration adjacent to the falciform ligament. No discrete focal hepatic lesion. Gallbladder physiologically distended. No calcified gallstone or pericholecystic inflammation. There is mild extrahepatic biliary ductal dilatation with common bile duct measuring 8 mm distally. Pancreas: No acute peripancreatic inflammation. There is proximal pancreatic ductal dilatation at 6 mm. No evidence of pancreatic cyst or focal lesion. Spleen: Normal in size without focal abnormality. Adrenals/Urinary Tract: No adrenal nodule. No hydronephrosis or perinephric edema. Homogeneous renal enhancement with symmetric excretion on delayed phase imaging. 16 mm cyst in the mid right kidney. Additional tiny hypodensity in the lower right kidney is incompletely characterized due to size. Urinary bladder is physiologically distended without wall thickening. Stomach/Bowel: Gastrostomy tube appropriately positioned in the stomach. No abnormality along the gastrostomy tube tract. Stomach is decompressed and unremarkable. No small bowel inflammation or obstruction. Normal appendix. Small to moderate colonic stool burden. No colonic wall thickening or inflammation. No  significant diverticular disease. Vascular/Lymphatic: Aorto bi-iliac atherosclerosis, moderate but advanced for age. No aortic aneurysm. The portal vein is patent. No acute vascular findings. No abdominopelvic adenopathy. Reproductive: Bilateral tubal ligation clips. Uterus unremarkable. Ovaries quiescent. No adnexal mass. Other: No free air, free fluid, or intra-abdominal fluid collection. Small fat containing umbilical hernia. Soft tissue densities in the lower anterior abdominal wall typical of medication injection sites. Musculoskeletal: There are no acute or suspicious osseous abnormalities. Degenerative disc disease at L5-S1. IMPRESSION: 1. Mild extrahepatic biliary ductal dilatation and proximal pancreatic ductal dilatation, of uncertain etiology and chronicity. No significant gallbladder dilatation or evidence of focal pancreatic or obstructing lesion. Recommend correlation with LFTs. If elevated, recommend further evaluation with MRCP or ERCP. MRCP should only be considered if patient is able to tolerate  breath hold technique, and could be performed on an elective outpatient basis. 2. No additional acute abnormality in the abdomen/pelvis. 3. Hepatic steatosis. 4. Aorto bi-iliac atherosclerosis, age advanced. 5. Gastrostomy tube appropriately positioned in the stomach. Aortic Atherosclerosis (ICD10-I70.0). Electronically Signed   By: Keith Rake M.D.   On: 08/28/2019 19:20   DG Chest Portable 1 View  Result Date: 08/28/2019 CLINICAL DATA:  Difficulty breathing. EXAM: PORTABLE CHEST 1 VIEW COMPARISON:  August 06, 2019 FINDINGS: There is stable tracheostomy tube positioning. The lungs are hyperinflated. There is no evidence of acute infiltrate, pleural effusion or pneumothorax. The heart size and mediastinal contours are within normal limits. The visualized skeletal structures are unremarkable. IMPRESSION: No active disease. Electronically Signed   By: Virgina Norfolk M.D.   On: 08/28/2019 17:31         Scheduled Meds: . aspirin  81 mg Oral Daily  . budesonide  0.5 mg Nebulization BID  . chlorhexidine  15 mL Mouth Rinse BID  . Chlorhexidine Gluconate Cloth  6 each Topical Daily  . clonazePAM  1.5 mg Oral TID  . cloNIDine  0.1 mg Transdermal Weekly  . enoxaparin (LOVENOX) injection  40 mg Subcutaneous Q24H  . famotidine  20 mg Per Tube BID  . insulin aspart  0-15 Units Subcutaneous TID WC  . insulin aspart  0-5 Units Subcutaneous QHS  . ipratropium-albuterol  3 mL Nebulization Q4H  . mouth rinse  15 mL Mouth Rinse 10 times per day  . mirtazapine  30 mg Oral QHS  . PARoxetine  20 mg Per Tube Daily  . Ensure Max Protein  11 oz Oral BID  . QUEtiapine  100 mg Per Tube QHS   Continuous Infusions: . sodium chloride 100 mL/hr at 08/30/19 1300  . ceFEPime (MAXIPIME) IV Stopped (08/30/19 0958)     LOS: 2 days    Time spent: 35 minutes    Sidney Ace, MD [facility] Pager [phone]  If 7PM-7AM, please contact night-coverage 08/30/2019, 2:11 PM

## 2019-08-30 NOTE — Progress Notes (Signed)
Pt A&OX4. VSS. Thick tracheal secretions suctioned. Drowsy this shift, per pt she did not sleep much last night. Klonopin held this AM for drowsiness.. Inner cannula changed. Family updated on POC. Will continue to monitor.

## 2019-08-30 NOTE — Progress Notes (Signed)
Pt refused to have morning labs drawn, Hinton Dyer NP notified.

## 2019-08-31 LAB — CBC WITH DIFFERENTIAL/PLATELET
Abs Immature Granulocytes: 0.07 10*3/uL (ref 0.00–0.07)
Basophils Absolute: 0 10*3/uL (ref 0.0–0.1)
Basophils Relative: 0 %
Eosinophils Absolute: 0 10*3/uL (ref 0.0–0.5)
Eosinophils Relative: 0 %
HCT: 34.7 % — ABNORMAL LOW (ref 36.0–46.0)
Hemoglobin: 10.7 g/dL — ABNORMAL LOW (ref 12.0–15.0)
Immature Granulocytes: 1 %
Lymphocytes Relative: 7 %
Lymphs Abs: 0.5 10*3/uL — ABNORMAL LOW (ref 0.7–4.0)
MCH: 25.8 pg — ABNORMAL LOW (ref 26.0–34.0)
MCHC: 30.8 g/dL (ref 30.0–36.0)
MCV: 83.6 fL (ref 80.0–100.0)
Monocytes Absolute: 0.4 10*3/uL (ref 0.1–1.0)
Monocytes Relative: 6 %
Neutro Abs: 6 10*3/uL (ref 1.7–7.7)
Neutrophils Relative %: 86 %
Platelets: 196 10*3/uL (ref 150–400)
RBC: 4.15 MIL/uL (ref 3.87–5.11)
RDW: 15.9 % — ABNORMAL HIGH (ref 11.5–15.5)
WBC: 7 10*3/uL (ref 4.0–10.5)
nRBC: 0 % (ref 0.0–0.2)

## 2019-08-31 LAB — GLUCOSE, CAPILLARY
Glucose-Capillary: 177 mg/dL — ABNORMAL HIGH (ref 70–99)
Glucose-Capillary: 183 mg/dL — ABNORMAL HIGH (ref 70–99)
Glucose-Capillary: 216 mg/dL — ABNORMAL HIGH (ref 70–99)
Glucose-Capillary: 131 mg/dL — ABNORMAL HIGH (ref 70–99)

## 2019-08-31 LAB — BASIC METABOLIC PANEL
Anion gap: 5 (ref 5–15)
BUN: 11 mg/dL (ref 6–20)
CO2: 22 mmol/L (ref 22–32)
Calcium: 8.9 mg/dL (ref 8.9–10.3)
Chloride: 116 mmol/L — ABNORMAL HIGH (ref 98–111)
Creatinine, Ser: 0.5 mg/dL (ref 0.44–1.00)
GFR calc Af Amer: >60 mL/min (ref >60)
GFR calc non Af Amer: >60 mL/min (ref >60)
Glucose, Bld: 173 mg/dL — ABNORMAL HIGH (ref 70–99)
Potassium: 4.4 mmol/L (ref 3.5–5.1)
Sodium: 143 mmol/L (ref 135–145)

## 2019-08-31 LAB — PHOSPHORUS: Phosphorus: 1.5 mg/dL — ABNORMAL LOW (ref 2.5–4.6)

## 2019-08-31 LAB — MAGNESIUM: Magnesium: 1.9 mg/dL (ref 1.7–2.4)

## 2019-08-31 MED ORDER — GLYCOPYRROLATE 1 MG PO TABS
1.0000 mg | ORAL_TABLET | Freq: Three times a day (TID) | ORAL | Status: DC
  Administered 2019-08-31 – 2019-09-03 (×9): 1 mg via ORAL
  Filled 2019-08-31 (×10): qty 1

## 2019-08-31 MED ORDER — INSULIN DETEMIR 100 UNIT/ML ~~LOC~~ SOLN
5.0000 [IU] | Freq: Two times a day (BID) | SUBCUTANEOUS | Status: DC
  Administered 2019-08-31 – 2019-09-03 (×7): 5 [IU] via SUBCUTANEOUS
  Filled 2019-08-31 (×9): qty 0.05

## 2019-08-31 MED ORDER — POTASSIUM & SODIUM PHOSPHATES 280-160-250 MG PO PACK
2.0000 | PACK | Freq: Three times a day (TID) | ORAL | Status: AC
  Administered 2019-08-31 (×3): 2 via ORAL
  Filled 2019-08-31 (×3): qty 2

## 2019-08-31 MED ORDER — CLONAZEPAM 1 MG PO TABS
1.0000 mg | ORAL_TABLET | Freq: Once | ORAL | Status: AC
  Administered 2019-08-31: 1 mg
  Filled 2019-08-31: qty 1

## 2019-08-31 MED ORDER — CLONAZEPAM 0.5 MG PO TABS
1.5000 mg | ORAL_TABLET | Freq: Three times a day (TID) | ORAL | Status: DC
Start: 2019-08-31 — End: 2019-09-03
  Administered 2019-08-31 – 2019-09-03 (×9): 1.5 mg via ORAL
  Filled 2019-08-31 (×9): qty 3

## 2019-08-31 MED ORDER — METOPROLOL TARTRATE 5 MG/5ML IV SOLN
INTRAVENOUS | Status: AC
  Administered 2019-08-31: 5 mg via INTRAVENOUS
  Filled 2019-08-31: qty 5

## 2019-08-31 MED ORDER — MORPHINE SULFATE (PF) 2 MG/ML IV SOLN
1.0000 mg | INTRAVENOUS | Status: DC | PRN
  Administered 2019-08-31 – 2019-09-03 (×5): 2 mg via INTRAVENOUS
  Filled 2019-08-31 (×6): qty 1

## 2019-08-31 MED ORDER — METHYLPREDNISOLONE SODIUM SUCC 40 MG IJ SOLR
40.0000 mg | Freq: Once | INTRAMUSCULAR | Status: AC
  Administered 2019-08-31: 40 mg via INTRAVENOUS

## 2019-08-31 MED ORDER — DEXMEDETOMIDINE HCL IN NACL 400 MCG/100ML IV SOLN
0.4000 ug/kg/h | INTRAVENOUS | Status: DC
  Administered 2019-09-01 (×2): 0.4 ug/kg/h via INTRAVENOUS
  Filled 2019-08-31 (×2): qty 100

## 2019-08-31 MED ORDER — METOPROLOL TARTRATE 5 MG/5ML IV SOLN: 5.0000 mg | INTRAVENOUS | Status: AC

## 2019-08-31 MED ORDER — METHYLPREDNISOLONE SODIUM SUCC 125 MG IJ SOLR
INTRAMUSCULAR | Status: AC
  Administered 2019-08-31: 125 mg
  Filled 2019-08-31: qty 2

## 2019-08-31 MED ORDER — MORPHINE SULFATE (PF) 2 MG/ML IV SOLN
2.0000 mg | Freq: Once | INTRAVENOUS | Status: AC
  Administered 2019-08-31: 2 mg via INTRAVENOUS

## 2019-08-31 MED ORDER — METHYLPREDNISOLONE SODIUM SUCC 40 MG IJ SOLR
40.0000 mg | INTRAMUSCULAR | Status: AC
  Administered 2019-08-31: 40 mg via INTRAVENOUS
  Filled 2019-08-31: qty 1

## 2019-08-31 MED ORDER — OXYCODONE-ACETAMINOPHEN 5-325 MG PO TABS
1.0000 | ORAL_TABLET | Freq: Three times a day (TID) | ORAL | Status: DC | PRN
  Administered 2019-09-02 (×2): 1 via ORAL
  Filled 2019-08-31 (×2): qty 1

## 2019-08-31 MED ORDER — METOPROLOL TARTRATE 5 MG/5ML IV SOLN: 2.5000 mg | Freq: Four times a day (QID) | INTRAVENOUS | Status: DC | PRN

## 2019-08-31 MED ORDER — HYDRALAZINE HCL 20 MG/ML IJ SOLN: 10.0000 mg | INTRAMUSCULAR | Status: DC | PRN

## 2019-08-31 NOTE — TOC Initial Note (Signed)
Transition of Care [facility]) - Initial/Assessment Note    Patient Details  Name: Michelle [name] MRN: [id number] Date of Birth: 1961-05-04  Transition of Care [facility]) CM/SW Contact:    Magnus Ivan, LCSW Phone Number: 08/31/2019, 12:06 PM  Clinical Narrative:                CSW spoke with patient's daughter, [name], for Readmission Screen. [name] confirmed patient is still living with her and she provides transportation for patient. Patient uses Armed forces technical officer. PCP is Dr. Sherril Cong. Patient receives [place] RN services through [facility] (CSW already spoke with Gilford Rile) and Outpatient Palliative through [facility] (CSW spoke with Nature conservation officer as well). Patient has a hospital bed, trilogy, hoyer, walker, and electric wheelchair at home. CSW will continue to follow for needs.    Expected Discharge Plan: [place] Barriers to Discharge: Continued Medical Work up   Patient Goals and CMS Choice        Expected Discharge Plan and Services Expected Discharge Plan: [place]       Living arrangements for the past 2 months: [place] Agency: [place] Date [facility] Agency Contacted: 08/31/19   Representative spoke with at [place]: Lisbeth Ply  Prior Living Arrangements/Services Living arrangements for the past 2 months: [place] Lives with:: Adult Children Patient language and need for interpreter reviewed:: Yes Do you feel safe going back to the place where you live?: Yes      Need for Family Participation in Patient Care: Yes (Comment) Care giver support system in place?: Yes (comment) Current home services: DME, Home RN, Other (comment)(Outpatient Palliative Services) Criminal Activity/Legal Involvement Pertinent to Current Situation/Hospitalization: No - Comment as needed  Activities of Daily Living      Permission Sought/Granted   Permission  granted to share information with : Yes, Verbal Permission Granted     Permission granted to share info w AGENCY: [facility]; [facility]        Emotional Assessment Appearance:: Appears stated age       Alcohol / Substance Use: Not Applicable Psych Involvement: No (comment)  Admission diagnosis:  Tracheitis [J04.10] Atypical pneumonia [J18.9] Ventilator associated pneumonia ([place]) [J95.851] HCAP (healthcare-associated pneumonia) [J18.9] Patient Active Problem List   Diagnosis Date Noted  . Atypical pneumonia 08/29/2019  . HCAP (healthcare-associated pneumonia) 08/28/2019  . Pseudomonas respiratory infection 07/03/2019  . Depression with anxiety 07/03/2019  . Hyperlipidemia associated with type 2 diabetes mellitus ([place]) 07/03/2019  . PEG (percutaneous endoscopic gastrostomy) status ([place]) 07/03/2019  . Chronic pain 07/03/2019  . Hypokalemia 07/03/2019  . DM (diabetes mellitus) ([place]) 06/24/2019  . Chronic respiratory failure requiring continuous mechanical ventilation through tracheostomy ([place]) 10/20/2018  . CAD (coronary artery disease) 10/20/2018  . HTN (hypertension) 10/20/2018  . Chronic diastolic CHF (congestive heart failure) ([place]) 10/20/2018  . Pneumonia 09/17/2018  . Panic disorder 10/29/2017  . Altered mental status   . Pressure injury of skin 10/23/2017  . Acute on chronic respiratory failure with hypoxia and hypercapnia ([place]) 06/29/2017  . Acute on chronic respiratory failure with hypoxia ([place]) 02/23/2017  . Protein-calorie malnutrition, severe 05/23/2016  . Sepsis ([place]) 05/22/2016  . Malnutrition of moderate degree ([place]) 10/25/2014  . COPD exacerbation ([place]) 10/24/2014  . Tobacco abuse 10/24/2014  . Community acquired pneumonia 10/24/2014  PCP:  Frazier Richards, MD Pharmacy:   [facility], [place] [address] Phone: [phone] Fax: [phone]  [id number] - [place], [place] [address] Phone: [phone] Fax: [phone]     Social Determinants of Health (SDOH) Interventions    Readmission Risk Interventions Readmission Risk Prevention Plan 08/31/2019 08/07/2019 09/19/2018  Transportation Screening Complete - Complete  PCP or Specialist Appt within 5-7 Days - - Complete  Home Care Screening - - Complete  Medication Review (RN CM) - - Complete  Medication Review ([place]) Complete - -  PCP or Specialist appointment within 3-5 days of discharge Complete Complete -  [place] or Home Care Consult Complete Complete -  Palliative Care Screening Complete Complete -  [place] Complete - -  Some recent data might be hidden

## 2019-08-31 NOTE — Progress Notes (Addendum)
Pt.  noted hypertensive with tachycardia and an increased work of breathing. Clonazepam 1mg  administered with the attempt to decrease anxiety. PRN Hydralazine 10 mg administered without success. Hinton Dyer NP notified. Morphine 2mg  & Solu-Medrol 40 mg ordered and administered. Will cont. to monitor pt.  2119- Lopressor 5mg  administered x 1 dose as ordered

## 2019-08-31 NOTE — Progress Notes (Signed)
PROGRESS NOTE    Michelle [name]  [id number] DOB: 1961-05-18 DOA: 08/28/2019 PCP: Frazier Richards, MD   Brief Narrative:   HPI: Michelle [name] is a 59 y.o. female with medical history significant of severe COPD with chronic tracheostomy, CHF, asthma, previous cocaine abuse, hypertension, coronary artery disease, hepatitis C, history of pneumothorax and tobacco abuse, morbid obesity who was recently admitted on March 4 with sepsis and respiratory failure requiring ICU admission.  Patient at the end of the stay was discharged home home with home health.  She was evaluated with cultures showing and growing Pseudomonas only sensitive to cefepime.  She came back today with early fevers shortness of breath as well as copious secretions.  She has also leukocytosis and early's signs of pneumonia on x-ray.  Patient is being admitted with possible tracheitis versus healthcare associated pneumonia.  Patient has significant history of recurrent hospitalizations and has a tracheostomy in place.  She is however fully awake and alert.  3/27: Patient seen and examined.  Still with copious secretions.  Requiring suctioning by RT services.  Remains on cefepime.  Mentating clearly.  No fevers noted since admission.  3/28: Patient seen and examined.  Does not endorse improvement in symptoms.  Still with copious secretions.  Lung sounds are relatively clear.  Mentation appears at baseline.  No fevers noted.  3/29: Patient seen and examined.  Continued difficulty breathing per patient.  Still requiring suctioning.  Lung sounds actually relatively clear.  Mentation at baseline.  No fevers noted over interval.  Assessment & Plan:   Principal Problem:   HCAP (healthcare-associated pneumonia) Active Problems:   Tobacco abuse   Malnutrition of moderate degree (HCC)   Sepsis ([place])   Acute on chronic respiratory failure with hypoxia (HCC)   HTN (hypertension)   DM (diabetes mellitus) (HCC)   Pseudomonas respiratory  infection   Hyperlipidemia associated with type 2 diabetes mellitus (HCC)   PEG (percutaneous endoscopic gastrostomy) status ([place])   Hypokalemia   Atypical pneumonia  Tracheitis versus healthcare associated pneumonia Chronic tracheostomy Chronic hypoxic respiratory failure Symptoms suggestive of pneumonia Chest imaging negative Suspect early infection Previous culture with Pseudomonas sensitive only to cefepime All cultures during his admission no growth to date Plan: Continue stepdown status Appreciate ICU recommendations Continue cefepime Follow sputum and blood cultures  Diabetes, insulin-dependent Per home med rec patient is on 25 units Lantus twice daily Diabetes coordinator following Adding 5 units twice daily Lantus moderate sliding scale coverage Nightly coverage Carb modified diet  Hyperlipidemia Home statin  Hypertension Improved control over interval Clonidine patch in place As needed hydralazine   Hypokalemia Replace as needed  Anxiety History of cocaine use Counseled patient Resume home Klonopin, Remeron, Seroquel   DVT prophylaxis: Lovenox Code Status: DNR Family Communication: Left voicemail for daughter Tracy-Lee Skeens [phone] on 08/29/2018 Disposition Plan: Anticipate return to previous home environment.  Will need resolution of pneumonia prior to formulation of disposition plan   Consultants:   Intensivist  Procedures:   None  Antimicrobials:  Cefepime (3/27-  )   Subjective: Patient seen and examined Remains hemodynamically stable Remains in stepdown unit  Objective: Vitals:   08/31/19 0800 08/31/19 0900 08/31/19 1000 08/31/19 1100  BP: (!) 149/79 (!) 141/74 (!) 152/84 (!) 147/83  Pulse: 61 74 69 64  Resp: 20 20 20 20   Temp:      TempSrc:      SpO2: 100% 100% 100% 100%  Weight:      Height:  Intake/Output Summary (Last 24 hours) at 08/31/2019 1358 Last data filed at 08/31/2019 1100 Gross per 24 hour  Intake  2515.74 ml  Output 1700 ml  Net 815.74 ml   Filed Weights   08/28/19 1620 08/29/19 1023  Weight: 72.6 kg 86.2 kg    Examination:  General exam: Mild respiratory distress, appears chronically ill Respiratory system: Coarse basilar rhonchi, mild increased work of breathing, status post tracheostomy Cardiovascular system: Tachycardic, regular rhythm, no murmurs, no pedal edema gastrointestinal system: Nontender/nondistended, normal bowel sounds  Central nervous system: Alert and oriented. No focal neurological deficits. Extremities: Symmetric 5 x 5 power. Skin: No rashes, lesions or ulcers Psychiatry: Judgement and insight appear normal. Mood & affect appropriate.     Data Reviewed: I have personally reviewed following labs and imaging studies  CBC: Recent Labs  Lab 08/28/19 1635 08/29/19 0212 08/30/19 1442 08/31/19 0611  WBC 12.0* 7.2 7.5 7.0  NEUTROABS 9.1*  --  6.8 6.0  HGB 13.8 11.6* 11.1* 10.7*  HCT 43.8 37.4 35.9* 34.7*  MCV 82.8 84.8 84.3 83.6  PLT 319 228 194 [id number]   Basic Metabolic Panel: Recent Labs  Lab 08/28/19 1635 08/29/19 0212 08/30/19 1442 08/31/19 0611  NA 142 139 142 143  K 3.4* 3.3* 4.0 4.4  CL 108 109 112* 116*  CO2 23 21* 19* 22  GLUCOSE 120* 137* 200* 173*  BUN 20 21* 12 11  CREATININE 0.71 0.73 0.63 0.50  CALCIUM 9.0 8.2* 8.5* 8.9  MG  --   --  1.9 1.9  PHOS  --   --  1.5* 1.5*   GFR: Estimated Creatinine Clearance: 79.7 mL/min (by C-G formula based on SCr of 0.5 mg/dL). Liver Function Tests: Recent Labs  Lab 08/28/19 1635 08/29/19 0212  AST 15 14*  ALT 15 13  ALKPHOS 101 82  BILITOT 0.8 0.8  PROT 7.8 6.6  ALBUMIN 4.0 3.3*   No results for input(s): LIPASE, AMYLASE in the last 168 hours. No results for input(s): AMMONIA in the last 168 hours. Coagulation Profile: Recent Labs  Lab 08/28/19 1635  INR 1.0   Cardiac Enzymes: No results for input(s): CKTOTAL, CKMB, CKMBINDEX, TROPONINI in the last 168 hours. BNP (last 3  results) No results for input(s): PROBNP in the last 8760 hours. HbA1C: Recent Labs    08/29/19 0212  HGBA1C 8.6*   CBG: Recent Labs  Lab 08/30/19 1139 08/30/19 1604 08/30/19 2110 08/31/19 0708 08/31/19 1125  GLUCAP 181* 187* 258* 177* 183*   Lipid Profile: No results for input(s): CHOL, HDL, LDLCALC, TRIG, CHOLHDL, LDLDIRECT in the last 72 hours. Thyroid Function Tests: No results for input(s): TSH, T4TOTAL, FREET4, T3FREE, THYROIDAB in the last 72 hours. Anemia Panel: No results for input(s): VITAMINB12, FOLATE, FERRITIN, TIBC, IRON, RETICCTPCT in the last 72 hours. Sepsis Labs: Recent Labs  Lab 08/28/19 1635  LATICACIDVEN 1.5    Recent Results (from the past 240 hour(s))  Blood Culture (routine x 2)     Status: None (Preliminary result)   Collection Time: 08/28/19  4:35 PM   Specimen: BLOOD  Result Value Ref Range Status   Specimen Description BLOOD R H  Final   Special Requests   Final    BOTTLES DRAWN AEROBIC AND ANAEROBIC Blood Culture results may not be optimal due to an inadequate volume of blood received in culture bottles   Culture   Final    NO GROWTH 3 DAYS Performed at [facility], [address] [place], [place] [address]  Report Status PENDING  Incomplete  Urine culture     Status: None   Collection Time: 08/28/19  5:12 PM   Specimen: In/Out Cath Urine  Result Value Ref Range Status   Specimen Description   Final    IN/OUT CATH URINE Performed at [facility], [address], [place], [place] [address]    Special Requests   Final    NONE Performed at [facility], [address], [place], [place] [address]    Culture   Final    NO GROWTH Performed at [facility], [place] [address] [place] [address], [place], [place] [address]    Report Status 08/30/2019 FINAL  Final  Culture, respiratory (non-expectorated)     Status: None (Preliminary result)   Collection Time: 08/28/19  5:12 PM   Specimen: Tracheal Aspirate;  Respiratory  Result Value Ref Range Status   Specimen Description   Final    TRACHEAL ASPIRATE Performed at [facility], [address] [place], [place] [address]    Special Requests   Final    Normal Performed at [facility], [address] [place], [place] [address]    Gram Stain   Final    FEW WBC PRESENT,BOTH PMN AND MONONUCLEAR FEW SQUAMOUS EPITHELIAL CELLS PRESENT ABUNDANT GRAM NEGATIVE RODS FEW GRAM POSITIVE COCCI IN CLUSTERS FEW GRAM POSITIVE RODS FEW YEAST    Culture   Final    ABUNDANT PSEUDOMONAS AERUGINOSA SUSCEPTIBILITIES TO FOLLOW CULTURE REINCUBATED FOR BETTER GROWTH Performed at [facility], [place] [address], [place], [place] [address]    Report Status PENDING  Incomplete  Blood Culture (routine x 2)     Status: None (Preliminary result)   Collection Time: 08/28/19  5:28 PM   Specimen: BLOOD  Result Value Ref Range Status   Specimen Description BLOOD RAC  Final   Special Requests   Final    BOTTLES DRAWN AEROBIC AND ANAEROBIC Blood Culture adequate volume   Culture   Final    NO GROWTH 3 DAYS Performed at [facility], [address], [place], [place] [address]    Report Status PENDING  Incomplete  Respiratory Panel by RT PCR (Flu A&B, Covid) - Nasopharyngeal Swab     Status: None   Collection Time: 08/28/19 10:08 PM   Specimen: Nasopharyngeal Swab  Result Value Ref Range Status   SARS Coronavirus 2 by RT PCR NEGATIVE NEGATIVE Final    Comment: (NOTE) SARS-CoV-2 target nucleic acids are NOT DETECTED. The SARS-CoV-2 RNA is generally detectable in upper respiratoy specimens during the acute phase of infection. The lowest concentration of SARS-CoV-2 viral copies this assay can detect is 131 copies/mL. A negative result does not preclude SARS-Cov-2 infection and should not be used as the sole basis for treatment or other patient management decisions. A negative result may occur with  improper specimen  collection/handling, submission of specimen other than nasopharyngeal swab, presence of viral mutation(s) within the areas targeted by this assay, and inadequate number of viral copies (<131 copies/mL). A negative result must be combined with clinical observations, patient history, and epidemiological information. The expected result is Negative. Fact Sheet for Patients:  PinkCheek.be Fact Sheet for Healthcare Providers:  GravelBags.it This test is not yet ap proved or cleared by the [place] FDA and  has been authorized for detection and/or diagnosis of SARS-CoV-2 by FDA under an Emergency Use Authorization (EUA). This EUA will remain  in effect (meaning this test can be used) for the  duration of the COVID-19 declaration under Section 564(b)(1) of the Act, 21 U.S.C. section 360bbb-3(b)(1), unless the authorization is terminated or revoked sooner.    Influenza A by PCR NEGATIVE NEGATIVE Final   Influenza B by PCR NEGATIVE NEGATIVE Final    Comment: (NOTE) The Xpert Xpress SARS-CoV-2/FLU/RSV assay is intended as an aid in  the diagnosis of influenza from Nasopharyngeal swab specimens and  should not be used as a sole basis for treatment. Nasal washings and  aspirates are unacceptable for Xpert Xpress SARS-CoV-2/FLU/RSV  testing. Fact Sheet for Patients: PinkCheek.be Fact Sheet for Healthcare Providers: GravelBags.it This test is not yet approved or cleared by the [place] FDA and  has been authorized for detection and/or diagnosis of SARS-CoV-2 by  FDA under an Emergency Use Authorization (EUA). This EUA will remain  in effect (meaning this test can be used) for the duration of the  Covid-19 declaration under Section 564(b)(1) of the Act, 21  U.S.C. section 360bbb-3(b)(1), unless the authorization is  terminated or revoked. Performed at [facility],  [address] [place], [place] [address]   MRSA PCR Screening     Status: None   Collection Time: 08/29/19  2:12 AM   Specimen: Nasopharyngeal  Result Value Ref Range Status   MRSA by PCR NEGATIVE NEGATIVE Final    Comment:        The GeneXpert MRSA Assay (FDA approved for NASAL specimens only), is one component of a comprehensive MRSA colonization surveillance program. It is not intended to diagnose MRSA infection nor to guide or monitor treatment for MRSA infections. Performed at [facility], [address], [place], [place] [address]          Radiology Studies: No results found.      Scheduled Meds: . aspirin  81 mg Oral Daily  . budesonide  0.5 mg Nebulization BID  . chlorhexidine  15 mL Mouth Rinse BID  . Chlorhexidine Gluconate Cloth  6 each Topical Daily  . clonazePAM  1.5 mg Oral TID  . cloNIDine  0.1 mg Transdermal Weekly  . enoxaparin (LOVENOX) injection  40 mg Subcutaneous Q24H  . famotidine  20 mg Per Tube BID  . insulin aspart  0-15 Units Subcutaneous TID WC  . insulin aspart  0-5 Units Subcutaneous QHS  . insulin detemir  5 Units Subcutaneous BID  . ipratropium-albuterol  3 mL Nebulization Q4H  . mouth rinse  15 mL Mouth Rinse 10 times per day  . mirtazapine  30 mg Oral QHS  . PARoxetine  20 mg Per Tube Daily  . potassium & sodium phosphates  2 packet Oral TID AC & HS  . Ensure Max Protein  11 oz Oral BID  . QUEtiapine  100 mg Per Tube QHS   Continuous Infusions: . sodium chloride Stopped (08/31/19 1053)  . ceFEPime (MAXIPIME) IV 200 mL/hr at 08/31/19 1100     LOS: 3 days    Time spent: 35 minutes    Sidney Ace, MD [facility] Pager [phone]  If 7PM-7AM, please contact night-coverage 08/31/2019, 1:58 PM

## 2019-08-31 NOTE — Progress Notes (Signed)
CSW attempted to call patient's daughter, [name], to complete Readmission Screening and confirm no changes since last hospitalization. Left a voicemail requesting a return call from [name].   CSW informed [facility] Representative Santiago Glad that patient is at the hospital.  CSW informed [place]. Judson Roch reported the contact person would be Lisbeth [phone]. Adonis Huguenin asked if it is possible for patient to have a swallowing eval. CSW inquired with MD. Adonis Huguenin also reported they have been short on RN staffing, but do have someone to start RN [facility] services back with patient when she is discharged home.  CSW will continue to follow.  Oleh Genin, [place]

## 2019-08-31 NOTE — Consult Note (Signed)
PHARMACY CONSULT NOTE - FOLLOW UP  Pharmacy Consult for Electrolyte Monitoring and Replacement   Recent Labs: Potassium (mmol/L)  Date Value  08/31/2019 4.4  07/11/2014 4.6   Magnesium (mg/dL)  Date Value  08/31/2019 1.9  10/31/2013 2.6 (H)   Calcium (mg/dL)  Date Value  08/31/2019 8.9   Calcium, Total (mg/dL)  Date Value  07/11/2014 8.9   Albumin (g/dL)  Date Value  08/29/2019 3.3 (L)  07/09/2014 3.7   Phosphorus (mg/dL)  Date Value  08/31/2019 1.5 (L)  10/31/2013 4.6   Sodium (mmol/L)  Date Value  08/31/2019 143  07/11/2014 139     Assessment: She was evaluated with cultures showing and growing Pseudomonas only sensitive to cefepime. She came back today with early fevers shortness of breath as well as copious secretions.  Patient was  admitted with possible tracheitis versus healthcare associated pneumonia. Patient has significant history of recurrent hospitalizations and has a tracheostomy in place.   Goal of Therapy:  Electrolytes WNL  Plan:   Phos-Nak 2 packets TID x 3  Next electrolytes in am  Dallie Piles ,PharmD, BCPS Clinical Pharmacist 08/31/2019 7:39 AM

## 2019-08-31 NOTE — Progress Notes (Signed)
Visited with patient today with daughter at bedside. I am familiar with patient from last visit. We talked for a moment, I offered prayer and will follow up.

## 2019-08-31 NOTE — Progress Notes (Signed)
Inpatient Diabetes Program Recommendations  AACE/ADA: New Consensus Statement on Inpatient Glycemic Control (2015)  Target Ranges:  Prepandial:   less than 140 mg/dL      Peak postprandial:   less than 180 mg/dL (1-2 hours)      Critically ill patients:  140 - 180 mg/dL   Lab Results  Component Value Date   GLUCAP 177 (H) 08/31/2019   HGBA1C 8.6 (H) 08/29/2019    Review of Glycemic Control Results for Michelle [name], Michelle [name] (MRN [id number]) as of 08/31/2019 11:10  Ref. Range 08/30/2019 08:08 08/30/2019 11:39 08/30/2019 16:04 08/30/2019 21:10 08/31/2019 07:08  Glucose-Capillary Latest Ref Range: 70 - 99 mg/dL 182 (H) 181 (H) 187 (H) 258 (H) 177 (H)   Diabetes history: DM 2 Outpatient Diabetes medications:  Levemir 25 units bid, Novolog 0-20 units tid with meals Current orders for Inpatient glycemic control:  Novolog 0-15 units tid with meals and HS Inpatient Diabetes Program Recommendations:   May consider restarting Levemir 5 units bid for basal insulin needs.    Thanks  Adah Perl, RN, BC-ADM Inpatient Diabetes Coordinator Pager [phone] (8a-5p)

## 2019-08-31 NOTE — Progress Notes (Signed)
Patient is currently followed by [facility] community Palliative program at home. TOC Meagan Hagwood aware. Flo Shanks BSN, RN, [place] [phone]

## 2019-08-31 NOTE — Progress Notes (Signed)
Patient maintains AOx4.  She still has rales sounds to her upper lung apex.  She has a home Trilogy ventilator to her #6 Shiley trach that I changed around 1600.  She has consumed 100% of her meals.  She denies pain but did have a period of air hunger near the end of shift for which she received 2mg  Morphine IV push.  Suctioned patient trach approx 3 times today per request.  Patient denied bath today after 3 offers.  Patient purewick suction output was 725ml for the shift.  Patient receiving NS @ 182ml/hr.  Vitals remain WDL.  No events this shift.

## 2019-08-31 NOTE — Progress Notes (Signed)
CRITICAL CARE PROGRESS NOTE    Name: Michelle [name] MRN: [id number] DOB: [birth year]     LOS: 3   SUBJECTIVE FINDINGS & SIGNIFICANT EVENTS   59 year old female, DNR/DNI, with past medical history notable for severe COPD requiring chronic home vent via chronic tracheostomy admitted with acute on chronic respiratory failure secondary to trachitis vs. recurrent pneumonia   SIGNIFICANT EVENTS/STUDIES:  03/26: Pt initially admitted to the telemetry unit with pneumonia, but due to chronic home vent requirements pt changed to stepdown status and PCCM consulted 03/27 3/27 febrile episode 3/29- patient reports clinical improvement but still has high volume secretions from tracheostomy    PAST MEDICAL HISTORY   Past Medical History:  Diagnosis Date  . Allergy   . Anxiety   . Asthma   . CHF (congestive heart failure) ([place])   . Cocaine abuse ([place])   . COPD (chronic obstructive pulmonary disease) ([place])   . Coronary artery disease   . Emphysema/COPD ([place])   . Hepatitis C   . High cholesterol   . Hypertension   . Pneumothorax   . Tobacco abuse      SURGICAL HISTORY   Past Surgical History:  Procedure Laterality Date  . CARDIAC CATHETERIZATION    . CHEST TUBE INSERTION    . IR GASTROSTOMY TUBE MOD SED  10/31/2017  . TRACHEOSTOMY TUBE PLACEMENT N/A 10/17/2017   Procedure: TRACHEOSTOMY;  Surgeon: Carloyn Manner, MD;  Location: [facility] ORS;  Service: ENT;  Laterality: N/A;     FAMILY HISTORY   Family History  Problem Relation Age of Onset  . Lung cancer Mother   . Asthma Mother   . CVA Father   . CAD Father   . Stroke Father   . Breast cancer Maternal Aunt 67  . COPD Sister      SOCIAL HISTORY   Social History   Tobacco Use  . Smoking status: Former Smoker    Packs/day: 0.10    Years: 41.00      Pack years: 4.10    Types: Cigarettes    Quit date: 06/09/2017    Years since quitting: 2.2  . Smokeless tobacco: Never Used  Substance Use Topics  . Alcohol use: No  . Drug use: Not Currently    Types: "Crack" cocaine    Comment: Past cocaine use.  Quit 04/2017     MEDICATIONS   Current Medication:  Current Facility-Administered Medications:  .  0.9 %  sodium chloride infusion, , Intravenous, Continuous, Sharion Settler, NP, Last Rate: 100 mL/hr at 08/31/19 1705, New Bag at 08/31/19 1705 .  acetaminophen (TYLENOL) tablet 650 mg, 650 mg, Oral, Q6H PRN, Flora Lipps, MD, 650 mg at 08/30/19 0748 .  aspirin chewable tablet 81 mg, 81 mg, Oral, Daily, Sreenath, Sudheer B, MD, 81 mg at 08/31/19 1108 .  budesonide (PULMICORT) nebulizer solution 0.5 mg, 0.5 mg, Nebulization, BID, Awilda Bill, NP, 0.5 mg at 08/31/19 0803 .  ceFEPIme (MAXIPIME) 2 g in sodium chloride 0.9 % 100 mL IVPB, 2 g, Intravenous, Q8H, Sharion Settler, NP, Last Rate: 200 mL/hr at 08/31/19 1718, 2 g at 08/31/19 1718 .  chlorhexidine (PERIDEX) 0.12 % solution 15 mL, 15 mL, Mouth Rinse, BID, Sharion Settler, NP .  Chlorhexidine Gluconate Cloth 2 % PADS 6 each, 6 each, Topical, Daily, Ralene Muskrat B, MD, 6 each at 08/30/19 1620 .  clonazePAM (KLONOPIN) tablet 1.5 mg, 1.5 mg, Oral, TID, Awilda Bill, NP, 1.5 mg at 08/31/19 1722 .  cloNIDine (CATAPRES -  Dosed in mg/24 hr) patch 0.1 mg, 0.1 mg, Transdermal, Weekly, Sreenath, Sudheer B, MD, 0.1 mg at 08/30/19 1104 .  enoxaparin (LOVENOX) injection 40 mg, 40 mg, Subcutaneous, Q24H, Sharion Settler, NP, 40 mg at 08/30/19 2151 .  famotidine (PEPCID) tablet 20 mg, 20 mg, Per Tube, BID, Awilda Bill, NP, 20 mg at 08/31/19 1108 .  fluticasone (FLONASE) 50 MCG/ACT nasal spray 1 spray, 1 spray, Each Nare, Daily PRN, Sreenath, Sudheer B, MD .  glycopyrrolate (ROBINUL) tablet 1 mg, 1 mg, Oral, TID, Lagretta Loseke, MD .  guaiFENesin-dextromethorphan (ROBITUSSIN DM)  100-10 MG/5ML syrup 5 mL, 5 mL, Oral, Q6H PRN, Sreenath, Sudheer B, MD .  hydrALAZINE (APRESOLINE) injection 10 mg, 10 mg, Intravenous, Q4H PRN, Priscella Mann, Sudheer B, MD, 10 mg at 08/31/19 0306 .  insulin aspart (novoLOG) injection 0-15 Units, 0-15 Units, Subcutaneous, TID WC, Sharion Settler, NP, 5 Units at 08/31/19 1706 .  insulin aspart (novoLOG) injection 0-5 Units, 0-5 Units, Subcutaneous, QHS, Sharion Settler, NP, 3 Units at 08/30/19 2151 .  insulin detemir (LEVEMIR) injection 5 Units, 5 Units, Subcutaneous, BID, Priscella Mann, Sudheer B, MD, 5 Units at 08/31/19 1304 .  ipratropium-albuterol (DUONEB) 0.5-2.5 (3) MG/3ML nebulizer solution 3 mL, 3 mL, Nebulization, Q4H, Blakeney, Dana G, NP, 3 mL at 08/31/19 1554 .  ipratropium-albuterol (DUONEB) 0.5-2.5 (3) MG/3ML nebulizer solution 3 mL, 3 mL, Nebulization, Q6H PRN, Awilda Bill, NP, 3 mL at 08/31/19 1813 .  MEDLINE mouth rinse, 15 mL, Mouth Rinse, 10 times per day, Sharion Settler, NP, 15 mL at 08/31/19 0600 .  mirtazapine (REMERON SOL-TAB) disintegrating tablet 30 mg, 30 mg, Oral, QHS, Sharion Settler, NP, 30 mg at 08/30/19 2152 .  morphine 2 MG/ML injection 1-2 mg, 1-2 mg, Intravenous, Q4H PRN, Awilda Bill, NP, 2 mg at 08/31/19 1840 .  oxyCODONE-acetaminophen (PERCOCET/ROXICET) 5-325 MG per tablet 1 tablet, 1 tablet, Oral, Q8H PRN, Awilda Bill, NP .  PARoxetine (PAXIL) tablet 20 mg, 20 mg, Per Tube, Daily, Sreenath, Sudheer B, MD, 20 mg at 08/31/19 1108 .  polyethylene glycol (MIRALAX / GLYCOLAX) packet 17 g, 17 g, Per Tube, Daily PRN, Sreenath, Sudheer B, MD .  protein supplement (ENSURE MAX) liquid, 11 oz, Oral, BID, Sreenath, Sudheer B, MD, 11 oz at 08/31/19 1259 .  QUEtiapine (SEROQUEL) tablet 100 mg, 100 mg, Per Tube, QHS, Sharion Settler, NP, 100 mg at 08/30/19 2152 .  senna (SENOKOT) tablet 8.6-17.2 mg, 1-2 tablet, Per Tube, QHS PRN, Sharion Settler, NP    ALLERGIES   Amoxicillin and Ativan [lorazepam]    REVIEW  OF SYSTEMS     10 point ROS done and is negative except for respiratory distress  PHYSICAL EXAMINATION   Vital Signs: Temp:  [97.3 F (36.3 C)-98.6 F (37 C)] 97.3 F (36.3 C) (03/29 1600) Pulse Rate:  [54-102] 86 (03/29 1700) Resp:  [16-24] 17 (03/29 1700) BP: (103-178)/(69-108) 131/86 (03/29 1700) SpO2:  [98 %-100 %] 98 % (03/29 1700) FiO2 (%):  [36 %] 36 % (03/29 1600)  GENERAL:chronically ill appearing HEAD: Normocephalic, atraumatic.  EYES: Pupils equal, round, reactive to light.  No scleral icterus.  MOUTH: Moist mucosal membrane. NECK: Supple. No thyromegaly. No nodules. No JVD.  PULMONARY: rhonhci and crackles bilaterallllllllllllllllllllllllllllllllllllllll CARDIOVASCULAR: S1 and S2. Regular rate and rhythm. No murmurs, rubs, or gallops.  GASTROINTESTINAL: Soft, nontender, non-distended. No masses. Positive bowel sounds. No hepatosplenomegaly.  MUSCULOSKELETAL: No swelling, clubbing, or edema.  NEUROLOGIC: Mild distress due to acute illness SKIN:intact,warm,dry   PERTINENT DATA  Infusions: . sodium chloride 100 mL/hr at 08/31/19 1705  . ceFEPime (MAXIPIME) IV 2 g (08/31/19 1718)   Scheduled Medications: . aspirin  81 mg Oral Daily  . budesonide  0.5 mg Nebulization BID  . chlorhexidine  15 mL Mouth Rinse BID  . Chlorhexidine Gluconate Cloth  6 each Topical Daily  . clonazePAM  1.5 mg Oral TID  . cloNIDine  0.1 mg Transdermal Weekly  . enoxaparin (LOVENOX) injection  40 mg Subcutaneous Q24H  . famotidine  20 mg Per Tube BID  . insulin aspart  0-15 Units Subcutaneous TID WC  . insulin aspart  0-5 Units Subcutaneous QHS  . insulin detemir  5 Units Subcutaneous BID  . ipratropium-albuterol  3 mL Nebulization Q4H  . mouth rinse  15 mL Mouth Rinse 10 times per day  . mirtazapine  30 mg Oral QHS  . PARoxetine  20 mg Per Tube Daily  . Ensure Max Protein  11 oz Oral BID  . QUEtiapine  100 mg Per Tube QHS   PRN Medications: acetaminophen, fluticasone,  guaiFENesin-dextromethorphan, hydrALAZINE, ipratropium-albuterol, morphine injection, oxyCODONE-acetaminophen, polyethylene glycol, senna Hemodynamic parameters:   Intake/Output: 03/28 0701 - 03/29 0700 In: 3083.6 [I.V.:2683.7; IV Piggyback:399.9] Out: 2700 [Urine:2700]  Ventilator  Settings: Vent Mode: AC FiO2 (%):  [36 %] 36 % Set Rate:  [20 bmp] 20 bmp Vt Set:  [450 mL] 450 mL PEEP:  [5 cmH20] 5 cmH20   LAB RESULTS:  Basic Metabolic Panel: Recent Labs  Lab 08/28/19 1635 08/28/19 1635 08/29/19 0212 08/29/19 0212 08/30/19 1442 08/31/19 0611  NA 142  --  139  --  142 143  K 3.4*   < > 3.3*   < > 4.0 4.4  CL 108  --  109  --  112* 116*  CO2 23  --  21*  --  19* 22  GLUCOSE 120*  --  137*  --  200* 173*  BUN 20  --  21*  --  12 11  CREATININE 0.71  --  0.73  --  0.63 0.50  CALCIUM 9.0  --  8.2*  --  8.5* 8.9  MG  --   --   --   --  1.9 1.9  PHOS  --   --   --   --  1.5* 1.5*   < > = values in this interval not displayed.   Liver Function Tests: Recent Labs  Lab 08/28/19 1635 08/29/19 0212  AST 15 14*  ALT 15 13  ALKPHOS 101 82  BILITOT 0.8 0.8  PROT 7.8 6.6  ALBUMIN 4.0 3.3*   No results for input(s): LIPASE, AMYLASE in the last 168 hours. No results for input(s): AMMONIA in the last 168 hours. CBC: Recent Labs  Lab 08/28/19 1635 08/29/19 0212 08/30/19 1442 08/31/19 0611  WBC 12.0* 7.2 7.5 7.0  NEUTROABS 9.1*  --  6.8 6.0  HGB 13.8 11.6* 11.1* 10.7*  HCT 43.8 37.4 35.9* 34.7*  MCV 82.8 84.8 84.3 83.6  PLT 319 228 194 196   Cardiac Enzymes: No results for input(s): CKTOTAL, CKMB, CKMBINDEX, TROPONINI in the last 168 hours. BNP: Invalid input(s): POCBNP CBG: Recent Labs  Lab 08/30/19 1604 08/30/19 2110 08/31/19 0708 08/31/19 1125 08/31/19 1600  GLUCAP 187* 258* 177* 183* 216*     IMAGING RESULTS:   ASSESSMENT AND PLAN    -Multidisciplinary rounds held today  Acute Hypoxic Respiratory Failure -continue Full NIV support -continue  Bronchodilator Therapy -Wean Fio2  -patient with increased  secretions from ETT - added rubinol - continue COPD carepath -chest PT - will add bed physiotherapy -IS and Flutter valve at bedside   GI/Nutrition GI PROPHYLAXIS as indicated DIET-->TF's as tolerated Constipation protocol as indicated  ENDO - ICU hypoglycemic\Hyperglycemia protocol -check FSBS per protocol   ELECTROLYTES -follow labs as needed -replace as needed -pharmacy consultation   DVT/GI PRX ordered -SCDs  TRANSFUSIONS AS NEEDED MONITOR FSBS ASSESS the need for LABS as needed   Critical care provider statement:    Critical care time (minutes):  33   Critical care time was exclusive of:  Separately billable procedures and treating other patients   Critical care was necessary to treat or prevent imminent or life-threatening deterioration of the following conditions:  Acute on chronic hypoxemic respiratory failure, advanced COPD, tracheostomy status, multiple comorbid conditions.    Critical care was time spent personally by me on the following activities:  Development of treatment plan with patient or surrogate, discussions with consultants, evaluation of patient's response to treatment, examination of patient, obtaining history from patient or surrogate, ordering and performing treatments and interventions, ordering and review of laboratory studies and re-evaluation of patient's condition.  I assumed direction of critical care for this patient from another provider in my specialty: no    This document was prepared using Dragon voice recognition software and may include unintentional dictation errors.    Ottie Glazier, M.D.  Division of [place]

## 2019-09-01 LAB — CULTURE, RESPIRATORY W GRAM STAIN: Special Requests: NORMAL

## 2019-09-01 LAB — CBC WITH DIFFERENTIAL/PLATELET
Abs Immature Granulocytes: 0.11 10*3/uL — ABNORMAL HIGH (ref 0.00–0.07)
Basophils Absolute: 0 10*3/uL (ref 0.0–0.1)
Basophils Relative: 0 %
Eosinophils Absolute: 0 10*3/uL (ref 0.0–0.5)
Eosinophils Relative: 0 %
HCT: 36.8 % (ref 36.0–46.0)
Hemoglobin: 11.3 g/dL — ABNORMAL LOW (ref 12.0–15.0)
Immature Granulocytes: 1 %
Lymphocytes Relative: 8 %
Lymphs Abs: 0.6 10*3/uL — ABNORMAL LOW (ref 0.7–4.0)
MCH: 26 pg (ref 26.0–34.0)
MCHC: 30.7 g/dL (ref 30.0–36.0)
MCV: 84.8 fL (ref 80.0–100.0)
Monocytes Absolute: 0.2 10*3/uL (ref 0.1–1.0)
Monocytes Relative: 2 %
Neutro Abs: 7 10*3/uL (ref 1.7–7.7)
Neutrophils Relative %: 89 %
Platelets: 217 10*3/uL (ref 150–400)
RBC: 4.34 MIL/uL (ref 3.87–5.11)
RDW: 16 % — ABNORMAL HIGH (ref 11.5–15.5)
WBC: 7.9 10*3/uL (ref 4.0–10.5)
nRBC: 0 % (ref 0.0–0.2)

## 2019-09-01 LAB — GLUCOSE, CAPILLARY
Glucose-Capillary: 203 mg/dL — ABNORMAL HIGH (ref 70–99)
Glucose-Capillary: 214 mg/dL — ABNORMAL HIGH (ref 70–99)
Glucose-Capillary: 116 mg/dL — ABNORMAL HIGH (ref 70–99)
Glucose-Capillary: 210 mg/dL — ABNORMAL HIGH (ref 70–99)
Glucose-Capillary: 96 mg/dL (ref 70–99)

## 2019-09-01 LAB — BASIC METABOLIC PANEL
Anion gap: 11 (ref 5–15)
BUN: 15 mg/dL (ref 6–20)
CO2: 21 mmol/L — ABNORMAL LOW (ref 22–32)
Calcium: 8.7 mg/dL — ABNORMAL LOW (ref 8.9–10.3)
Chloride: 111 mmol/L (ref 98–111)
Creatinine, Ser: 0.61 mg/dL (ref 0.44–1.00)
GFR calc Af Amer: >60 mL/min (ref >60)
GFR calc non Af Amer: >60 mL/min (ref >60)
Glucose, Bld: 187 mg/dL — ABNORMAL HIGH (ref 70–99)
Potassium: 4.6 mmol/L (ref 3.5–5.1)
Sodium: 143 mmol/L (ref 135–145)

## 2019-09-01 LAB — PHOSPHORUS: Phosphorus: 2.3 mg/dL — ABNORMAL LOW (ref 2.5–4.6)

## 2019-09-01 LAB — MAGNESIUM: Magnesium: 1.9 mg/dL (ref 1.7–2.4)

## 2019-09-01 MED ORDER — DEXMEDETOMIDINE HCL IN NACL 400 MCG/100ML IV SOLN
0.4000 ug/kg/h | INTRAVENOUS | Status: DC
  Administered 2019-09-02 – 2019-09-03 (×3): 0.4 ug/kg/h via INTRAVENOUS
  Filled 2019-09-01 (×2): qty 100

## 2019-09-01 MED ORDER — POTASSIUM & SODIUM PHOSPHATES 280-160-250 MG PO PACK
2.0000 | PACK | Freq: Once | ORAL | Status: AC
  Administered 2019-09-01: 2 via ORAL
  Filled 2019-09-01: qty 2

## 2019-09-01 NOTE — Consult Note (Signed)
PHARMACY CONSULT NOTE - FOLLOW UP  Pharmacy Consult for Electrolyte Monitoring and Replacement   Recent Labs: Potassium (mmol/L)  Date Value  09/01/2019 4.6  07/11/2014 4.6   Magnesium (mg/dL)  Date Value  09/01/2019 1.9  10/31/2013 2.6 (H)   Calcium (mg/dL)  Date Value  09/01/2019 8.7 (L)   Calcium, Total (mg/dL)  Date Value  07/11/2014 8.9   Albumin (g/dL)  Date Value  08/29/2019 3.3 (L)  07/09/2014 3.7   Phosphorus (mg/dL)  Date Value  09/01/2019 2.3 (L)  10/31/2013 4.6   Sodium (mmol/L)  Date Value  09/01/2019 143  07/11/2014 139     Assessment: She was evaluated with cultures showing and growing Pseudomonas only sensitive to cefepime. She came back today with early fevers shortness of breath as well as copious secretions.  Patient was  admitted with possible tracheitis versus healthcare associated pneumonia. Patient has significant history of recurrent hospitalizations and has a tracheostomy in place.   Goal of Therapy:  Electrolytes WNL  Plan:   Phos-Nak 2 packets x 1  Next electrolytes in am  Dallie Piles ,PharmD, BCPS Clinical Pharmacist 09/01/2019 7:37 AM

## 2019-09-01 NOTE — Progress Notes (Signed)
Patient remains AOx4.  She consumed no meals today and denied her mouth care.  Delivered insulin regardless to cover her sensitivity.  Lurline Idol was changed today.  Patient experienced Tachycardia and Tachypnea after assisting patient to [facility].  Had to move patient with force to bring her back to bed as she became exhausted during middle of the turn.  USE BED PAN TO ASSIST PATIENT WITH BOWEL MOVEMENT.  Patient received 2mg  morphine IV push for air hunger after toileting event.  She continues to receive cefepime IV infusion.  Daughter visited at bedside today.  Patient refused rotation in bed and readjusting.

## 2019-09-01 NOTE — Progress Notes (Signed)
CRITICAL CARE PROGRESS NOTE    Name: ANAIJA SORRENTI MRN: [id number] DOB: 12-04-1960     LOS: 26   SUBJECTIVE FINDINGS & SIGNIFICANT EVENTS   59 year old female, DNR/DNI, with past medical history notable for severe COPD requiring chronic home vent via chronic tracheostomy admitted with acute on chronic respiratory failure secondary to trachitis vs. recurrent pneumonia   SIGNIFICANT EVENTS/STUDIES:  03/26: Pt initially admitted to the telemetry unit with pneumonia, but due to chronic home vent requirements pt changed to stepdown status and PCCM consulted 03/27 3/27 febrile episode 3/29- patient reports clinical improvement but still has high volume secretions from tracheostomy 3/30- patient with improved respiratory status, decreased rhonchi and clinical improvement   PAST MEDICAL HISTORY   Past Medical History:  Diagnosis Date  . Allergy   . Anxiety   . Asthma   . CHF (congestive heart failure) ([place])   . Cocaine abuse ([place])   . COPD (chronic obstructive pulmonary disease) ([place])   . Coronary artery disease   . Emphysema/COPD ([place])   . Hepatitis C   . High cholesterol   . Hypertension   . Pneumothorax   . Tobacco abuse      SURGICAL HISTORY   Past Surgical History:  Procedure Laterality Date  . CARDIAC CATHETERIZATION    . CHEST TUBE INSERTION    . IR GASTROSTOMY TUBE MOD SED  10/31/2017  . TRACHEOSTOMY TUBE PLACEMENT N/A 10/17/2017   Procedure: TRACHEOSTOMY;  Surgeon: Carloyn Manner, MD;  Location: [facility] ORS;  Service: ENT;  Laterality: N/A;     FAMILY HISTORY   Family History  Problem Relation Age of Onset  . Lung cancer Mother   . Asthma Mother   . CVA Father   . CAD Father   . Stroke Father   . Breast cancer Maternal Aunt 67  . COPD Sister      SOCIAL HISTORY   Social  History   Tobacco Use  . Smoking status: Former Smoker    Packs/day: 0.10    Years: 41.00    Pack years: 4.10    Types: Cigarettes    Quit date: 06/09/2017    Years since quitting: 2.2  . Smokeless tobacco: Never Used  Substance Use Topics  . Alcohol use: No  . Drug use: Not Currently    Types: "Crack" cocaine    Comment: Past cocaine use.  Quit 04/2017     MEDICATIONS   Current Medication:  Current Facility-Administered Medications:  .  0.9 %  sodium chloride infusion, , Intravenous, Continuous, Sharion Settler, NP, Last Rate: 100 mL/hr at 09/01/19 0800, Rate Verify at 09/01/19 0800 .  acetaminophen (TYLENOL) tablet 650 mg, 650 mg, Oral, Q6H PRN, Flora Lipps, MD, 650 mg at 08/31/19 2033 .  aspirin chewable tablet 81 mg, 81 mg, Oral, Daily, Sreenath, Sudheer B, MD, 81 mg at 09/01/19 1026 .  budesonide (PULMICORT) nebulizer solution 0.5 mg, 0.5 mg, Nebulization, BID, Awilda Bill, NP, 0.5 mg at 09/01/19 0844 .  ceFEPIme (MAXIPIME) 2 g in sodium chloride 0.9 % 100 mL IVPB, 2 g, Intravenous, Q8H, Sharion Settler, NP, Last Rate: 200 mL/hr at 09/01/19 1025, 2 g at 09/01/19 1025 .  chlorhexidine (PERIDEX) 0.12 % solution 15 mL, 15 mL, Mouth Rinse, BID, Sharion Settler, NP, 15 mL at 08/31/19 1940 .  Chlorhexidine Gluconate Cloth 2 % PADS 6 each, 6 each, Topical, Daily, Priscella Mann, Sudheer B, MD, 6 each at 09/01/19 1026 .  clonazePAM (KLONOPIN) tablet 1.5 mg, 1.5 mg, Oral,  TID, Hart Robinsons A, RPH, 1.5 mg at 09/01/19 1026 .  cloNIDine (CATAPRES - Dosed in mg/24 hr) patch 0.1 mg, 0.1 mg, Transdermal, Weekly, Sreenath, Sudheer B, MD, 0.1 mg at 08/30/19 1104 .  dexmedetomidine (PRECEDEX) 400 MCG/100ML (4 mcg/mL) infusion, 0.4-1.2 mcg/kg/hr, Intravenous, Titrated, Awilda Bill, NP, Stopped at 09/01/19 0703 .  enoxaparin (LOVENOX) injection 40 mg, 40 mg, Subcutaneous, Q24H, Sharion Settler, NP, 40 mg at 08/31/19 2234 .  famotidine (PEPCID) tablet 20 mg, 20 mg, Per Tube, BID, Awilda Bill, NP, 20 mg at 09/01/19 1026 .  fluticasone (FLONASE) 50 MCG/ACT nasal spray 1 spray, 1 spray, Each Nare, Daily PRN, Sreenath, Sudheer B, MD .  glycopyrrolate (ROBINUL) tablet 1 mg, 1 mg, Oral, TID, Lanney Gins, Jahnyla Parrillo, MD, 1 mg at 09/01/19 1026 .  guaiFENesin-dextromethorphan (ROBITUSSIN DM) 100-10 MG/5ML syrup 5 mL, 5 mL, Oral, Q6H PRN, Sreenath, Sudheer B, MD .  hydrALAZINE (APRESOLINE) injection 10-20 mg, 10-20 mg, Intravenous, Q4H PRN, Melene Muller, Dreama Saa, NP .  insulin aspart (novoLOG) injection 0-15 Units, 0-15 Units, Subcutaneous, TID WC, Sharion Settler, NP, 5 Units at 09/01/19 [phone] .  insulin aspart (novoLOG) injection 0-5 Units, 0-5 Units, Subcutaneous, QHS, Sharion Settler, NP, 3 Units at 08/30/19 2151 .  insulin detemir (LEVEMIR) injection 5 Units, 5 Units, Subcutaneous, BID, Priscella Mann, Sudheer B, MD, 5 Units at 09/01/19 1026 .  ipratropium-albuterol (DUONEB) 0.5-2.5 (3) MG/3ML nebulizer solution 3 mL, 3 mL, Nebulization, Q4H, Blakeney, Dana G, NP, 3 mL at 09/01/19 1146 .  ipratropium-albuterol (DUONEB) 0.5-2.5 (3) MG/3ML nebulizer solution 3 mL, 3 mL, Nebulization, Q6H PRN, Awilda Bill, NP, 3 mL at 08/31/19 1813 .  MEDLINE mouth rinse, 15 mL, Mouth Rinse, 10 times per day, Sharion Settler, NP, 15 mL at 09/01/19 0605 .  metoprolol tartrate (LOPRESSOR) injection 2.5-5 mg, 2.5-5 mg, Intravenous, Q6H PRN, Awilda Bill, NP .  mirtazapine (REMERON SOL-TAB) disintegrating tablet 30 mg, 30 mg, Oral, QHS, Sharion Settler, NP, 30 mg at 08/31/19 2234 .  morphine 2 MG/ML injection 1-2 mg, 1-2 mg, Intravenous, Q4H PRN, Awilda Bill, NP, 2 mg at 08/31/19 1840 .  oxyCODONE-acetaminophen (PERCOCET/ROXICET) 5-325 MG per tablet 1 tablet, 1 tablet, Oral, Q8H PRN, Awilda Bill, NP .  PARoxetine (PAXIL) tablet 20 mg, 20 mg, Per Tube, Daily, Sreenath, Sudheer B, MD, 20 mg at 09/01/19 1026 .  polyethylene glycol (MIRALAX / GLYCOLAX) packet 17 g, 17 g, Per Tube, Daily PRN, Sreenath, Sudheer  B, MD .  protein supplement (ENSURE MAX) liquid, 11 oz, Oral, BID, Sreenath, Sudheer B, MD, 11 oz at 08/31/19 1259 .  QUEtiapine (SEROQUEL) tablet 100 mg, 100 mg, Per Tube, QHS, Sharion Settler, NP, 100 mg at 08/31/19 2234 .  senna (SENOKOT) tablet 8.6-17.2 mg, 1-2 tablet, Per Tube, QHS PRN, Sharion Settler, NP    ALLERGIES   Amoxicillin and Ativan [lorazepam]    REVIEW OF SYSTEMS     10 point ROS done and is negative except for respiratory distress  PHYSICAL EXAMINATION   Vital Signs: Temp:  [97.3 F (36.3 C)-97.9 F (36.6 C)] 97.8 F (36.6 C) (03/30 0800) Pulse Rate:  [52-117] 52 (03/30 0800) Resp:  [16-34] 20 (03/30 0800) BP: (109-196)/(69-106) 168/94 (03/30 0800) SpO2:  [97 %-100 %] 100 % (03/30 0800) FiO2 (%):  [36 %] 36 % (03/30 0847)  GENERAL:chronically ill appearing HEAD: Normocephalic, atraumatic.  EYES: Pupils equal, round, reactive to light.  No scleral icterus.  MOUTH: Moist mucosal membrane. NECK: Supple. No thyromegaly. No nodules. No  JVD.  PULMONARY: rhonchi bilaterally improved from previous CARDIOVASCULAR: S1 and S2. Regular rate and rhythm. No murmurs, rubs, or gallops.  GASTROINTESTINAL: Soft, nontender, non-distended. No masses. Positive bowel sounds. No hepatosplenomegaly.  MUSCULOSKELETAL: No swelling, clubbing, or edema.  NEUROLOGIC: Mild distress due to acute illness SKIN:intact,warm,dry   PERTINENT DATA     Infusions: . sodium chloride 100 mL/hr at 09/01/19 0800  . ceFEPime (MAXIPIME) IV 2 g (09/01/19 1025)  . dexmedetomidine (PRECEDEX) IV infusion Stopped (09/01/19 0703)   Scheduled Medications: . aspirin  81 mg Oral Daily  . budesonide  0.5 mg Nebulization BID  . chlorhexidine  15 mL Mouth Rinse BID  . Chlorhexidine Gluconate Cloth  6 each Topical Daily  . clonazePAM  1.5 mg Oral TID  . cloNIDine  0.1 mg Transdermal Weekly  . enoxaparin (LOVENOX) injection  40 mg Subcutaneous Q24H  . famotidine  20 mg Per Tube BID  .  glycopyrrolate  1 mg Oral TID  . insulin aspart  0-15 Units Subcutaneous TID WC  . insulin aspart  0-5 Units Subcutaneous QHS  . insulin detemir  5 Units Subcutaneous BID  . ipratropium-albuterol  3 mL Nebulization Q4H  . mouth rinse  15 mL Mouth Rinse 10 times per day  . mirtazapine  30 mg Oral QHS  . PARoxetine  20 mg Per Tube Daily  . Ensure Max Protein  11 oz Oral BID  . QUEtiapine  100 mg Per Tube QHS   PRN Medications: acetaminophen, fluticasone, guaiFENesin-dextromethorphan, hydrALAZINE, ipratropium-albuterol, metoprolol tartrate, morphine injection, oxyCODONE-acetaminophen, polyethylene glycol, senna Hemodynamic parameters:   Intake/Output: 03/29 0701 - 03/30 0700 In: 3055.6 [P.O.:480; I.V.:2275.6; IV Piggyback:300.1] Out: 1900 [Urine:1900]  Ventilator  Settings: Vent Mode: AC FiO2 (%):  [36 %] 36 % Set Rate:  [20 bmp] 20 bmp Vt Set:  [450 mL] 450 mL PEEP:  [5 cmH20] 5 cmH20   LAB RESULTS:  Basic Metabolic Panel: Recent Labs  Lab 08/28/19 1635 08/28/19 1635 08/29/19 0212 08/29/19 0212 08/30/19 1442 08/30/19 1442 08/31/19 0611 09/01/19 0420  NA 142  --  139  --  142  --  143 143  K 3.4*   < > 3.3*   < > 4.0   < > 4.4 4.6  CL 108  --  109  --  112*  --  116* 111  CO2 23  --  21*  --  19*  --  22 21*  GLUCOSE 120*  --  137*  --  200*  --  173* 187*  BUN 20  --  21*  --  12  --  11 15  CREATININE 0.71  --  0.73  --  0.63  --  0.50 0.61  CALCIUM 9.0  --  8.2*  --  8.5*  --  8.9 8.7*  MG  --   --   --   --  1.9  --  1.9 1.9  PHOS  --   --   --   --  1.5*  --  1.5* 2.3*   < > = values in this interval not displayed.   Liver Function Tests: Recent Labs  Lab 08/28/19 1635 08/29/19 0212  AST 15 14*  ALT 15 13  ALKPHOS 101 82  BILITOT 0.8 0.8  PROT 7.8 6.6  ALBUMIN 4.0 3.3*   No results for input(s): LIPASE, AMYLASE in the last 168 hours. No results for input(s): AMMONIA in the last 168 hours. CBC: Recent Labs  Lab 08/28/19 1635 08/29/19 [id number]  08/30/19 1442 08/31/19 0611 09/01/19 0420  WBC 12.0* 7.2 7.5 7.0 7.9  NEUTROABS 9.1*  --  6.8 6.0 7.0  HGB 13.8 11.6* 11.1* 10.7* 11.3*  HCT 43.8 37.4 35.9* 34.7* 36.8  MCV 82.8 84.8 84.3 83.6 84.8  PLT 319 228 194 196 217   Cardiac Enzymes: No results for input(s): CKTOTAL, CKMB, CKMBINDEX, TROPONINI in the last 168 hours. BNP: Invalid input(s): POCBNP CBG: Recent Labs  Lab 08/31/19 1125 08/31/19 1600 08/31/19 2232 09/01/19 0729 09/01/19 1133  GLUCAP 183* 216* 131* 203* 214*     IMAGING RESULTS:   ASSESSMENT AND PLAN    -Multidisciplinary rounds held today  Acute Hypoxic Respiratory Failure -continue Full NIV support -continue Bronchodilator Therapy -Wean Fio2  -patient with increased secretions from ETT - added rubinol - continue COPD carepath -chest PT - will add bed physiotherapy -IS and Flutter valve at bedside -Tracheostomy care per RN/RT   GI/Nutrition GI PROPHYLAXIS as indicated DIET-->TF's as tolerated Constipation protocol as indicated   ENDO - ICU hypoglycemic\Hyperglycemia protocol -check FSBS per protocol   ELECTROLYTES -follow labs as needed -replace as needed -pharmacy consultation   DVT/GI PRX ordered -SCDs  TRANSFUSIONS AS NEEDED MONITOR FSBS ASSESS the need for LABS as needed   Critical care provider statement:    Critical care time (minutes):  33   Critical care time was exclusive of:  Separately billable procedures and treating other patients   Critical care was necessary to treat or prevent imminent or life-threatening deterioration of the following conditions:  Acute on chronic hypoxemic respiratory failure, advanced COPD, tracheostomy status, multiple comorbid conditions.    Critical care was time spent personally by me on the following activities:  Development of treatment plan with patient or surrogate, discussions with consultants, evaluation of patient's response to treatment, examination of patient, obtaining  history from patient or surrogate, ordering and performing treatments and interventions, ordering and review of laboratory studies and re-evaluation of patient's condition.  I assumed direction of critical care for this patient from another provider in my specialty: no    This document was prepared using Dragon voice recognition software and may include unintentional dictation errors.    Ottie Glazier, M.D.  Division of [facility]

## 2019-09-01 NOTE — Progress Notes (Signed)
Patient is AOx4.  She is very tired as she suffered anxiety throughout the evening.  Precedex was stopped this morning upon assessment.  Patient lung sounds are diminished but clear with no prior sounds of rales.  Patient states she is fatigued.  She is still receiving NS @100ml /hr.  She was bathed last night and delivered antianxiety medications to help with her sleep and her HTN SBP.  Currently she states that she is cold.  We dressed her bed with a warm blanket and placed her remaining blankets on top to enclose the heat around her body.  Patient is now resting.

## 2019-09-01 NOTE — Progress Notes (Signed)
PROGRESS NOTE    Michelle [name]  [id number] DOB: [birth year] DOA: 08/28/2019 PCP: Frazier Richards, MD   Brief Narrative:   HPI: Michelle [name] is a 59 y.o. female with medical history significant of severe COPD with chronic tracheostomy, CHF, asthma, previous cocaine abuse, hypertension, coronary artery disease, hepatitis C, history of pneumothorax and tobacco abuse, morbid obesity who was recently admitted on March 4 with sepsis and respiratory failure requiring ICU admission.  Patient at the end of the stay was discharged home home with home health.  She was evaluated with cultures showing and growing Pseudomonas only sensitive to cefepime.  She came back today with early fevers shortness of breath as well as copious secretions.  She has also leukocytosis and early's signs of pneumonia on x-ray.  Patient is being admitted with possible tracheitis versus healthcare associated pneumonia.  Patient has significant history of recurrent hospitalizations and has a tracheostomy in place.  She is however fully awake and alert.  3/27: Patient seen and examined.  Still with copious secretions.  Requiring suctioning by RT services.  Remains on cefepime.  Mentating clearly.  No fevers noted since admission.  3/28: Patient seen and examined.  Does not endorse improvement in symptoms.  Still with copious secretions.  Lung sounds are relatively clear.  Mentation appears at baseline.  No fevers noted.  3/29: Patient seen and examined.  Continued difficulty breathing per patient.  Still requiring suctioning.  Lung sounds actually relatively clear.  Mentation at baseline.  No fevers noted over interval.  3/30: Patient seen and examined.  Some agitation noted overnight.  Patient required a Precedex drip yesterday..  Weaned off today.  Patient is fatigued however respiratory status appears to be improving.  Assessment & Plan:   Principal Problem:   HCAP (healthcare-associated pneumonia) Active Problems:  Tobacco abuse   Malnutrition of moderate degree (HCC)   Sepsis ([place])   Acute on chronic respiratory failure with hypoxia (HCC)   HTN (hypertension)   DM (diabetes mellitus) (HCC)   Pseudomonas respiratory infection   Hyperlipidemia associated with type 2 diabetes mellitus (HCC)   PEG (percutaneous endoscopic gastrostomy) status ([place])   Hypokalemia   Atypical pneumonia  Tracheitis versus healthcare associated pneumonia Chronic tracheostomy Chronic hypoxic respiratory failure Symptoms suggestive of pneumonia Chest imaging negative Suspect early infection Previous culture with Pseudomonas sensitive only to cefepime Culture from tracheal aspirate positive for Pseudomonas aeruginosa and Serratia marcescens Plan: Continue stepdown status Appreciate ICU recommendations Continue cefepime Follow sputum and blood cultures  Diabetes, insulin-dependent Per home med rec patient is on 25 units Lantus twice daily Diabetes coordinator following Adding 5 units twice daily Lantus moderate sliding scale coverage Nightly coverage Carb modified diet  Hyperlipidemia Home statin  Hypertension Improved control over interval Clonidine patch in place As needed hydralazine   Hypokalemia Replace as needed  Anxiety History of cocaine use Counseled patient Resume home Klonopin, Remeron, Seroquel   DVT prophylaxis: Lovenox Code Status: DNR Family Communication: Left voicemail for daughter Tymira Villalona [phone] on 08/29/2018 Disposition Plan: Anticipate return to previous home environment.  Will need resolution of pneumonia prior to formulation of disposition plan   Consultants:   Intensivist  Procedures:   None  Antimicrobials:  Cefepime (3/27-  )   Subjective: Patient seen and examined Remains hemodynamically stable Remains in stepdown unit  Objective: Vitals:   09/01/19 0500 09/01/19 0600 09/01/19 0700 09/01/19 0800  BP: (!) 162/92 (!) 173/96 (!) 167/97 (!) 168/94   Pulse: 60 (!) 56 (!)  53 (!) 52  Resp: 20 20 20 20   Temp:    97.8 F (36.6 C)  TempSrc:    Oral  SpO2: 100% 100% 100% 100%  Weight:      Height:        Intake/Output Summary (Last 24 hours) at 09/01/2019 1402 Last data filed at 09/01/2019 0800 Gross per 24 hour  Intake 2284.84 ml  Output 1900 ml  Net 384.84 ml   Filed Weights   08/28/19 1620 08/29/19 1023  Weight: 72.6 kg 86.2 kg    Examination:  General exam: Mild respiratory distress, appears chronically ill Respiratory system: Coarse basilar rhonchi, mild increased work of breathing, status post tracheostomy Cardiovascular system: Tachycardic, regular rhythm, no murmurs, no pedal edema gastrointestinal system: Nontender/nondistended, normal bowel sounds  Central nervous system: Alert and oriented. No focal neurological deficits. Extremities: Symmetric 5 x 5 power. Skin: No rashes, lesions or ulcers Psychiatry: Judgement and insight appear normal. Mood & affect appropriate.     Data Reviewed: I have personally reviewed following labs and imaging studies  CBC: Recent Labs  Lab 08/28/19 1635 08/29/19 0212 08/30/19 1442 08/31/19 0611 09/01/19 0420  WBC 12.0* 7.2 7.5 7.0 7.9  NEUTROABS 9.1*  --  6.8 6.0 7.0  HGB 13.8 11.6* 11.1* 10.7* 11.3*  HCT 43.8 37.4 35.9* 34.7* 36.8  MCV 82.8 84.8 84.3 83.6 84.8  PLT 319 228 194 196 [id number]   Basic Metabolic Panel: Recent Labs  Lab 08/28/19 1635 08/29/19 0212 08/30/19 1442 08/31/19 0611 09/01/19 0420  NA 142 139 142 143 143  K 3.4* 3.3* 4.0 4.4 4.6  CL 108 109 112* 116* 111  CO2 23 21* 19* 22 21*  GLUCOSE 120* 137* 200* 173* 187*  BUN 20 21* 12 11 15   CREATININE 0.71 0.73 0.63 0.50 0.61  CALCIUM 9.0 8.2* 8.5* 8.9 8.7*  MG  --   --  1.9 1.9 1.9  PHOS  --   --  1.5* 1.5* 2.3*   GFR: Estimated Creatinine Clearance: 79.7 mL/min (by C-G formula based on SCr of 0.61 mg/dL). Liver Function Tests: Recent Labs  Lab 08/28/19 1635 08/29/19 0212  AST 15 14*  ALT 15 13   ALKPHOS 101 82  BILITOT 0.8 0.8  PROT 7.8 6.6  ALBUMIN 4.0 3.3*   No results for input(s): LIPASE, AMYLASE in the last 168 hours. No results for input(s): AMMONIA in the last 168 hours. Coagulation Profile: Recent Labs  Lab 08/28/19 1635  INR 1.0   Cardiac Enzymes: No results for input(s): CKTOTAL, CKMB, CKMBINDEX, TROPONINI in the last 168 hours. BNP (last 3 results) No results for input(s): PROBNP in the last 8760 hours. HbA1C: No results for input(s): HGBA1C in the last 72 hours. CBG: Recent Labs  Lab 08/31/19 1125 08/31/19 1600 08/31/19 2232 09/01/19 0729 09/01/19 1133  GLUCAP 183* 216* 131* 203* 214*   Lipid Profile: No results for input(s): CHOL, HDL, LDLCALC, TRIG, CHOLHDL, LDLDIRECT in the last 72 hours. Thyroid Function Tests: No results for input(s): TSH, T4TOTAL, FREET4, T3FREE, THYROIDAB in the last 72 hours. Anemia Panel: No results for input(s): VITAMINB12, FOLATE, FERRITIN, TIBC, IRON, RETICCTPCT in the last 72 hours. Sepsis Labs: Recent Labs  Lab 08/28/19 1635  LATICACIDVEN 1.5    Recent Results (from the past 240 hour(s))  Blood Culture (routine x 2)     Status: None (Preliminary result)   Collection Time: 08/28/19  4:35 PM   Specimen: BLOOD  Result Value Ref Range Status   Specimen Description BLOOD  R H  Final   Special Requests   Final    BOTTLES DRAWN AEROBIC AND ANAEROBIC Blood Culture results may not be optimal due to an inadequate volume of blood received in culture bottles   Culture   Final    NO GROWTH 4 DAYS Performed at [facility], [address] [place], [place] [address]    Report Status PENDING  Incomplete  Urine culture     Status: None   Collection Time: 08/28/19  5:12 PM   Specimen: In/Out Cath Urine  Result Value Ref Range Status   Specimen Description   Final    IN/OUT CATH URINE Performed at [facility], [address], [place], [place] [address]    Special Requests   Final    NONE Performed  at [facility], [address] [place], [place] [address]    Culture   Final    NO GROWTH Performed at [facility], [place] [address] [place], [place] [address]    Report Status 08/30/2019 FINAL  Final  Culture, respiratory (non-expectorated)     Status: None   Collection Time: 08/28/19  5:12 PM   Specimen: Tracheal Aspirate; Respiratory  Result Value Ref Range Status   Specimen Description   Final    TRACHEAL ASPIRATE Performed at [facility], [address] [place], [place] [address]    Special Requests   Final    Normal Performed at [facility], [place], [place] [address]    Gram Stain   Final    FEW WBC PRESENT,BOTH PMN AND MONONUCLEAR FEW SQUAMOUS EPITHELIAL CELLS PRESENT ABUNDANT GRAM NEGATIVE RODS FEW GRAM POSITIVE COCCI IN CLUSTERS FEW GRAM POSITIVE RODS FEW YEAST Performed at [facility], [place] [address], [place], [place] [address]    Culture   Final    ABUNDANT PSEUDOMONAS AERUGINOSA MODERATE SERRATIA MARCESCENS    Report Status 09/01/2019 FINAL  Final   Organism ID, Bacteria PSEUDOMONAS AERUGINOSA  Final   Organism ID, Bacteria SERRATIA MARCESCENS  Final      Susceptibility   Pseudomonas aeruginosa - MIC*    CEFTAZIDIME 8 SENSITIVE Sensitive     CIPROFLOXACIN >=4 RESISTANT Resistant     GENTAMICIN 4 SENSITIVE Sensitive     IMIPENEM 2 SENSITIVE Sensitive     * ABUNDANT PSEUDOMONAS AERUGINOSA   Serratia marcescens - MIC*    CEFTAZIDIME 8 SENSITIVE Sensitive     CIPROFLOXACIN >=4 RESISTANT Resistant     GENTAMICIN 4 SENSITIVE Sensitive     IMIPENEM 2 SENSITIVE Sensitive     * MODERATE SERRATIA MARCESCENS  Blood Culture (routine x 2)     Status: None (Preliminary result)   Collection Time: 08/28/19  5:28 PM   Specimen: BLOOD  Result Value Ref Range Status   Specimen Description BLOOD RAC  Final   Special Requests   Final    BOTTLES DRAWN AEROBIC AND ANAEROBIC Blood Culture adequate volume    Culture   Final    NO GROWTH 4 DAYS Performed at [facility], [address] [place], [place] [address]    Report Status PENDING  Incomplete  Respiratory Panel by RT PCR (Flu A&B, Covid) - Nasopharyngeal Swab     Status: None   Collection Time: 08/28/19 10:08 PM   Specimen: Nasopharyngeal Swab  Result Value Ref Range Status   SARS Coronavirus 2 by RT PCR NEGATIVE NEGATIVE Final    Comment: (NOTE) SARS-CoV-2 target nucleic acids  are NOT DETECTED. The SARS-CoV-2 RNA is generally detectable in upper respiratoy specimens during the acute phase of infection. The lowest concentration of SARS-CoV-2 viral copies this assay can detect is 131 copies/mL. A negative result does not preclude SARS-Cov-2 infection and should not be used as the sole basis for treatment or other patient management decisions. A negative result may occur with  improper specimen collection/handling, submission of specimen other than nasopharyngeal swab, presence of viral mutation(s) within the areas targeted by this assay, and inadequate number of viral copies (<131 copies/mL). A negative result must be combined with clinical observations, patient history, and epidemiological information. The expected result is Negative. Fact Sheet for Patients:  PinkCheek.be Fact Sheet for Healthcare Providers:  GravelBags.it This test is not yet ap proved or cleared by the [place] FDA and  has been authorized for detection and/or diagnosis of SARS-CoV-2 by FDA under an Emergency Use Authorization (EUA). This EUA will remain  in effect (meaning this test can be used) for the duration of the COVID-19 declaration under Section 564(b)(1) of the Act, 21 U.S.C. section 360bbb-3(b)(1), unless the authorization is terminated or revoked sooner.    Influenza A by PCR NEGATIVE NEGATIVE Final   Influenza B by PCR NEGATIVE NEGATIVE Final    Comment: (NOTE) The Xpert  Xpress SARS-CoV-2/FLU/RSV assay is intended as an aid in  the diagnosis of influenza from Nasopharyngeal swab specimens and  should not be used as a sole basis for treatment. Nasal washings and  aspirates are unacceptable for Xpert Xpress SARS-CoV-2/FLU/RSV  testing. Fact Sheet for Patients: PinkCheek.be Fact Sheet for Healthcare Providers: GravelBags.it This test is not yet approved or cleared by the [place] FDA and  has been authorized for detection and/or diagnosis of SARS-CoV-2 by  FDA under an Emergency Use Authorization (EUA). This EUA will remain  in effect (meaning this test can be used) for the duration of the  Covid-19 declaration under Section 564(b)(1) of the Act, 21  U.S.C. section 360bbb-3(b)(1), unless the authorization is  terminated or revoked. Performed at [facility], [address] [place], [place] [address]   MRSA PCR Screening     Status: None   Collection Time: 08/29/19  2:12 AM   Specimen: Nasopharyngeal  Result Value Ref Range Status   MRSA by PCR NEGATIVE NEGATIVE Final    Comment:        The GeneXpert MRSA Assay (FDA approved for NASAL specimens only), is one component of a comprehensive MRSA colonization surveillance program. It is not intended to diagnose MRSA infection nor to guide or monitor treatment for MRSA infections. Performed at [facility], [address], [place], [place] [address]          Radiology Studies: No results found.      Scheduled Meds: . aspirin  81 mg Oral Daily  . budesonide  0.5 mg Nebulization BID  . chlorhexidine  15 mL Mouth Rinse BID  . Chlorhexidine Gluconate Cloth  6 each Topical Daily  . clonazePAM  1.5 mg Oral TID  . cloNIDine  0.1 mg Transdermal Weekly  . enoxaparin (LOVENOX) injection  40 mg Subcutaneous Q24H  . famotidine  20 mg Per Tube BID  . glycopyrrolate  1 mg Oral TID  . insulin aspart  0-15 Units  Subcutaneous TID WC  . insulin aspart  0-5 Units Subcutaneous QHS  . insulin detemir  5 Units Subcutaneous BID  . ipratropium-albuterol  3 mL Nebulization Q4H  . mouth rinse  15  mL Mouth Rinse 10 times per day  . mirtazapine  30 mg Oral QHS  . PARoxetine  20 mg Per Tube Daily  . potassium & sodium phosphates  2 packet Oral Once  . Ensure Max Protein  11 oz Oral BID  . QUEtiapine  100 mg Per Tube QHS   Continuous Infusions: . sodium chloride 100 mL/hr at 09/01/19 0800  . ceFEPime (MAXIPIME) IV 2 g (09/01/19 1025)     LOS: 4 days    Time spent: 35 minutes    Sidney Ace, MD [facility] Pager [phone]  If 7PM-7AM, please contact night-coverage 09/01/2019, 2:02 PM

## 2019-09-02 LAB — CBC WITH DIFFERENTIAL/PLATELET
Abs Immature Granulocytes: 0.05 10*3/uL (ref 0.00–0.07)
Basophils Absolute: 0 10*3/uL (ref 0.0–0.1)
Basophils Relative: 0 %
Eosinophils Absolute: 0 10*3/uL (ref 0.0–0.5)
Eosinophils Relative: 0 %
HCT: 34.7 % — ABNORMAL LOW (ref 36.0–46.0)
Hemoglobin: 10.6 g/dL — ABNORMAL LOW (ref 12.0–15.0)
Immature Granulocytes: 1 %
Lymphocytes Relative: 19 %
Lymphs Abs: 1.2 10*3/uL (ref 0.7–4.0)
MCH: 25.9 pg — ABNORMAL LOW (ref 26.0–34.0)
MCHC: 30.5 g/dL (ref 30.0–36.0)
MCV: 84.6 fL (ref 80.0–100.0)
Monocytes Absolute: 0.7 10*3/uL (ref 0.1–1.0)
Monocytes Relative: 10 %
Neutro Abs: 4.4 10*3/uL (ref 1.7–7.7)
Neutrophils Relative %: 70 %
Platelets: 227 10*3/uL (ref 150–400)
RBC: 4.1 MIL/uL (ref 3.87–5.11)
RDW: 16.3 % — ABNORMAL HIGH (ref 11.5–15.5)
WBC: 6.3 10*3/uL (ref 4.0–10.5)
nRBC: 0 % (ref 0.0–0.2)

## 2019-09-02 LAB — GLUCOSE, CAPILLARY
Glucose-Capillary: 145 mg/dL — ABNORMAL HIGH (ref 70–99)
Glucose-Capillary: 95 mg/dL (ref 70–99)
Glucose-Capillary: 124 mg/dL — ABNORMAL HIGH (ref 70–99)
Glucose-Capillary: 115 mg/dL — ABNORMAL HIGH (ref 70–99)

## 2019-09-02 LAB — MAGNESIUM: Magnesium: 2 mg/dL (ref 1.7–2.4)

## 2019-09-02 LAB — PHOSPHORUS: Phosphorus: 2.5 mg/dL (ref 2.5–4.6)

## 2019-09-02 LAB — BASIC METABOLIC PANEL
Anion gap: 7 (ref 5–15)
BUN: 19 mg/dL (ref 6–20)
CO2: 23 mmol/L (ref 22–32)
Calcium: 8.5 mg/dL — ABNORMAL LOW (ref 8.9–10.3)
Chloride: 115 mmol/L — ABNORMAL HIGH (ref 98–111)
Creatinine, Ser: 0.7 mg/dL (ref 0.44–1.00)
GFR calc Af Amer: >60 mL/min (ref >60)
GFR calc non Af Amer: >60 mL/min (ref >60)
Glucose, Bld: 123 mg/dL — ABNORMAL HIGH (ref 70–99)
Potassium: 3.5 mmol/L (ref 3.5–5.1)
Sodium: 145 mmol/L (ref 135–145)

## 2019-09-02 LAB — CULTURE, BLOOD (ROUTINE X 2)
Culture: NO GROWTH
Culture: NO GROWTH
Special Requests: ADEQUATE

## 2019-09-02 MED ORDER — POTASSIUM CHLORIDE 20 MEQ PO PACK
20.0000 meq | PACK | Freq: Once | ORAL | Status: AC
  Administered 2019-09-02: 20 meq via ORAL
  Filled 2019-09-02: qty 1

## 2019-09-02 NOTE — Progress Notes (Signed)
Initial Nutrition Assessment  DOCUMENTATION CODES:   Obesity unspecified  INTERVENTION:   Ensure Max protein supplement BID, each supplement provides 150kcal and 30g of protein.  NUTRITION DIAGNOSIS:   Increased nutrient needs related to chronic illness(COPD) as evidenced by increased estimated needs.  GOAL:   Patient will meet greater than or equal to 90% of their needs  MONITOR:   PO intake, Supplement acceptance, Labs, Weight trends, Skin, I & O's, Vent status  REASON FOR ASSESSMENT:   Ventilator    ASSESSMENT:   59 year old female with PMHx of COPD, emphysema, HTN, CAD, hx cocaine use, asthma, CHF, anxiety, hepatitis C, hx tracheostomy tube placement on 10/17/2017 on home vent, hx G-tube placement 10/31/2017 admitted with acute respiratory failure and sepsis secondary to suspected HCAP   Met with pt in room today. Pt is well known to nutrition department and this RD from multiple previous admits. Pt with good appetite and oral intake at baseline. Pt eating [employer] hamburger and fries for lunch today. Pt reports that she has not been drinking her Ensure Max as it has to be served really cold for her to drink it. Pt with G-tube in place that she uses just for meds. Per chart, pt has gained 56lbs over the past year. Pt is on mirtazapine for depression. Spoke with patient about the importance of adequate nutrition and the need to get proper protein in order to preserve her lean muscle; pt agrees to drink protein supplements.     Medications reviewed and include: aspirin, lovenox, insulin, mirtazapine, paxil, NaCl @100ml /hr, precedex  Labs reviewed: K 3.5 wnl, P 2.5 wnl, Mg 2.0 wnl Hgb 10.6(L), Hct 34.7(L)  NUTRITION - FOCUSED PHYSICAL EXAM:    Most Recent Value  Orbital Region  No depletion  Upper Arm Region  No depletion  Thoracic and Lumbar Region  No depletion  Buccal Region  No depletion  Temple Region  No depletion  Clavicle Bone Region  No depletion  Clavicle and  Acromion Bone Region  No depletion  Scapular Bone Region  No depletion  Dorsal Hand  No depletion  Patellar Region  No depletion  Anterior Thigh Region  No depletion  Posterior Calf Region  No depletion  Edema (RD Assessment)  Mild  Hair  Reviewed  Eyes  Reviewed  Mouth  Reviewed  Skin  Reviewed  Nails  Reviewed     Diet Order:   Diet Order            Diet heart healthy/carb modified Room service appropriate? Yes; Fluid consistency: Thin  Diet effective now             EDUCATION NEEDS:   Education needs have been addressed  Skin:  Skin Assessment: Reviewed RN Assessment(ecchymosis, MASD)  Last BM:  3/30- type 4  Height:   Ht Readings from Last 1 Encounters:  08/28/19 5' 3"  (1.6 m)    Weight:   Wt Readings from Last 1 Encounters:  08/29/19 86.2 kg    Ideal Body Weight:  52.3 kg  BMI:  Body mass index is 33.66 kg/m.  Estimated Nutritional Needs:   Kcal:  1700-2000kcal/day  Protein:  85-95g/day  Fluid:  >1.6L/day  Knox Saliva MS, RD, LDN Please refer to AMION for RD and/or RD on-call/weekend/after hours pager

## 2019-09-02 NOTE — Progress Notes (Signed)
PROGRESS NOTE    Michelle [name]  [id number] DOB: 08-Apr-1961 DOA: 08/28/2019 PCP: Frazier Richards, MD    Brief Narrative:  59 year old female, DNR/DNI, with past medical history notable for severe COPD requiring chronic home vent via chronic tracheostomy admitted with acute on chronic respiratory failure secondary to trachitis vs. recurrent pneumonia    Consultants:   pccm  Procedures:   Antimicrobials:      Subjective: Overnight was on Precedex drip and weaned off the same.  Laying in bed no acute distress.  States feels better no new complaints.  Objective: Vitals:   09/02/19 1300 09/02/19 1400 09/02/19 1500 09/02/19 1600  BP: 137/83 (!) 163/92 (!) 161/84 (!) 153/86  Pulse: 64 74 78 86  Resp: 20 18 20 18   Temp:  98.1 F (36.7 C)    TempSrc:  Oral    SpO2: 100% 100% 100% 100%  Weight:      Height:        Intake/Output Summary (Last 24 hours) at 09/02/2019 1648 Last data filed at 09/02/2019 1610 Gross per 24 hour  Intake 2338.43 ml  Output 2676 ml  Net -337.57 ml   Filed Weights   08/28/19 1620 08/29/19 1023  Weight: 72.6 kg 86.2 kg    Examination:  General exam: Appears calm and comfortable, on trach Respiratory system: Bilateral rhonchi Cardiovascular system: S1 & S2 heard, RRR. No JVD, murmurs, rubs, gallops or clicks Gastrointestinal system: Abdomen is nondistended, soft and nontender. Normal bowel sounds heard. Central nervous system: Alert and oriented. Extremities: Trace edema bilaterally Skin: Warm dry Psychiatry: . Mood & affect appropriate.     Data Reviewed: I have personally reviewed following labs and imaging studies  CBC: Recent Labs  Lab 08/28/19 1635 08/28/19 1635 08/29/19 0212 08/30/19 1442 08/31/19 0611 09/01/19 0420 09/02/19 0421  WBC 12.0*   < > 7.2 7.5 7.0 7.9 6.3  NEUTROABS 9.1*  --   --  6.8 6.0 7.0 4.4  HGB 13.8   < > 11.6* 11.1* 10.7* 11.3* 10.6*  HCT 43.8   < > 37.4 35.9* 34.7* 36.8 34.7*  MCV 82.8   < > 84.8  84.3 83.6 84.8 84.6  PLT 319   < > 228 194 196 217 227   < > = values in this interval not displayed.   Basic Metabolic Panel: Recent Labs  Lab 08/29/19 0212 08/30/19 1442 08/31/19 0611 09/01/19 0420 09/02/19 0421  NA 139 142 143 143 145  K 3.3* 4.0 4.4 4.6 3.5  CL 109 112* 116* 111 115*  CO2 21* 19* 22 21* 23  GLUCOSE 137* 200* 173* 187* 123*  BUN 21* 12 11 15 19   CREATININE 0.73 0.63 0.50 0.61 0.70  CALCIUM 8.2* 8.5* 8.9 8.7* 8.5*  MG  --  1.9 1.9 1.9 2.0  PHOS  --  1.5* 1.5* 2.3* 2.5   GFR: Estimated Creatinine Clearance: 79.7 mL/min (by C-G formula based on SCr of 0.7 mg/dL). Liver Function Tests: Recent Labs  Lab 08/28/19 1635 08/29/19 0212  AST 15 14*  ALT 15 13  ALKPHOS 101 82  BILITOT 0.8 0.8  PROT 7.8 6.6  ALBUMIN 4.0 3.3*   No results for input(s): LIPASE, AMYLASE in the last 168 hours. No results for input(s): AMMONIA in the last 168 hours. Coagulation Profile: Recent Labs  Lab 08/28/19 1635  INR 1.0   Cardiac Enzymes: No results for input(s): CKTOTAL, CKMB, CKMBINDEX, TROPONINI in the last 168 hours. BNP (last 3 results) No results for input(s):  PROBNP in the last 8760 hours. HbA1C: No results for input(s): HGBA1C in the last 72 hours. CBG: Recent Labs  Lab 09/01/19 1935 09/01/19 2201 09/02/19 0713 09/02/19 1146 09/02/19 1540  GLUCAP 210* 96 145* 95 124*   Lipid Profile: No results for input(s): CHOL, HDL, LDLCALC, TRIG, CHOLHDL, LDLDIRECT in the last 72 hours. Thyroid Function Tests: No results for input(s): TSH, T4TOTAL, FREET4, T3FREE, THYROIDAB in the last 72 hours. Anemia Panel: No results for input(s): VITAMINB12, FOLATE, FERRITIN, TIBC, IRON, RETICCTPCT in the last 72 hours. Sepsis Labs: Recent Labs  Lab 08/28/19 1635  LATICACIDVEN 1.5    Recent Results (from the past 240 hour(s))  Blood Culture (routine x 2)     Status: None   Collection Time: 08/28/19  4:35 PM   Specimen: BLOOD  Result Value Ref Range Status    Specimen Description BLOOD R H  Final   Special Requests   Final    BOTTLES DRAWN AEROBIC AND ANAEROBIC Blood Culture results may not be optimal due to an inadequate volume of blood received in culture bottles   Culture   Final    NO GROWTH 5 DAYS Performed at [facility], [address], [place], [place] [address]    Report Status 09/02/2019 FINAL  Final  Urine culture     Status: None   Collection Time: 08/28/19  5:12 PM   Specimen: In/Out Cath Urine  Result Value Ref Range Status   Specimen Description   Final    IN/OUT CATH URINE Performed at [facility], [address] [place], [place] [address]    Special Requests   Final    NONE Performed at [facility], [address] [place], [place] [address]    Culture   Final    NO GROWTH Performed at [facility], [place] [address] [place], [place] [address]    Report Status 08/30/2019 FINAL  Final  Culture, respiratory (non-expectorated)     Status: None   Collection Time: 08/28/19  5:12 PM   Specimen: Tracheal Aspirate; Respiratory  Result Value Ref Range Status   Specimen Description   Final    TRACHEAL ASPIRATE Performed at [facility], [address] [place], [place] [address]    Special Requests   Final    Normal Performed at [facility], [place], [place] [address]    Gram Stain   Final    FEW WBC PRESENT,BOTH PMN AND MONONUCLEAR FEW SQUAMOUS EPITHELIAL CELLS PRESENT ABUNDANT GRAM NEGATIVE RODS FEW GRAM POSITIVE COCCI IN CLUSTERS FEW GRAM POSITIVE RODS FEW YEAST Performed at [facility], [place] [address] [place], [place] [address]    Culture   Final    ABUNDANT PSEUDOMONAS AERUGINOSA MODERATE SERRATIA MARCESCENS    Report Status 09/01/2019 FINAL  Final   Organism ID, Bacteria PSEUDOMONAS AERUGINOSA  Final   Organism ID, Bacteria SERRATIA MARCESCENS  Final      Susceptibility   Pseudomonas aeruginosa - MIC*    CEFTAZIDIME 8  SENSITIVE Sensitive     CIPROFLOXACIN >=4 RESISTANT Resistant     GENTAMICIN 4 SENSITIVE Sensitive     IMIPENEM 2 SENSITIVE Sensitive     * ABUNDANT PSEUDOMONAS AERUGINOSA   Serratia marcescens - MIC*    CEFTAZIDIME 8 SENSITIVE Sensitive     CIPROFLOXACIN >=4 RESISTANT Resistant     GENTAMICIN 4 SENSITIVE Sensitive     IMIPENEM 2 SENSITIVE Sensitive     * MODERATE SERRATIA  MARCESCENS  Blood Culture (routine x 2)     Status: None   Collection Time: 08/28/19  5:28 PM   Specimen: BLOOD  Result Value Ref Range Status   Specimen Description BLOOD RAC  Final   Special Requests   Final    BOTTLES DRAWN AEROBIC AND ANAEROBIC Blood Culture adequate volume   Culture   Final    NO GROWTH 5 DAYS Performed at [facility], [address] [place], [place] [address]    Report Status 09/02/2019 FINAL  Final  Respiratory Panel by RT PCR (Flu A&B, Covid) - Nasopharyngeal Swab     Status: None   Collection Time: 08/28/19 10:08 PM   Specimen: Nasopharyngeal Swab  Result Value Ref Range Status   SARS Coronavirus 2 by RT PCR NEGATIVE NEGATIVE Final    Comment: (NOTE) SARS-CoV-2 target nucleic acids are NOT DETECTED. The SARS-CoV-2 RNA is generally detectable in upper respiratoy specimens during the acute phase of infection. The lowest concentration of SARS-CoV-2 viral copies this assay can detect is 131 copies/mL. A negative result does not preclude SARS-Cov-2 infection and should not be used as the sole basis for treatment or other patient management decisions. A negative result may occur with  improper specimen collection/handling, submission of specimen other than nasopharyngeal swab, presence of viral mutation(s) within the areas targeted by this assay, and inadequate number of viral copies (<131 copies/mL). A negative result must be combined with clinical observations, patient history, and epidemiological information. The expected result is Negative. Fact Sheet for Patients:    PinkCheek.be Fact Sheet for Healthcare Providers:  GravelBags.it This test is not yet ap proved or cleared by the [place] FDA and  has been authorized for detection and/or diagnosis of SARS-CoV-2 by FDA under an Emergency Use Authorization (EUA). This EUA will remain  in effect (meaning this test can be used) for the duration of the COVID-19 declaration under Section 564(b)(1) of the Act, 21 U.S.C. section 360bbb-3(b)(1), unless the authorization is terminated or revoked sooner.    Influenza A by PCR NEGATIVE NEGATIVE Final   Influenza B by PCR NEGATIVE NEGATIVE Final    Comment: (NOTE) The Xpert Xpress SARS-CoV-2/FLU/RSV assay is intended as an aid in  the diagnosis of influenza from Nasopharyngeal swab specimens and  should not be used as a sole basis for treatment. Nasal washings and  aspirates are unacceptable for Xpert Xpress SARS-CoV-2/FLU/RSV  testing. Fact Sheet for Patients: PinkCheek.be Fact Sheet for Healthcare Providers: GravelBags.it This test is not yet approved or cleared by the [place] FDA and  has been authorized for detection and/or diagnosis of SARS-CoV-2 by  FDA under an Emergency Use Authorization (EUA). This EUA will remain  in effect (meaning this test can be used) for the duration of the  Covid-19 declaration under Section 564(b)(1) of the Act, 21  U.S.C. section 360bbb-3(b)(1), unless the authorization is  terminated or revoked. Performed at [facility], [address] [place], [place] [address]   MRSA PCR Screening     Status: None   Collection Time: 08/29/19  2:12 AM   Specimen: Nasopharyngeal  Result Value Ref Range Status   MRSA by PCR NEGATIVE NEGATIVE Final    Comment:        The GeneXpert MRSA Assay (FDA approved for NASAL specimens only), is one component of a comprehensive MRSA colonization surveillance  program. It is not intended to diagnose MRSA infection nor to guide or monitor treatment for MRSA infections. Performed at [facility], [address], [place], [place] [address]          Radiology Studies: No results found.      Scheduled Meds: . aspirin  81 mg Oral Daily  . budesonide  0.5 mg Nebulization BID  . chlorhexidine  15 mL Mouth Rinse BID  . Chlorhexidine Gluconate Cloth  6 each Topical Daily  . clonazePAM  1.5 mg Oral TID  . cloNIDine  0.1 mg Transdermal Weekly  . enoxaparin (LOVENOX) injection  40 mg Subcutaneous Q24H  . famotidine  20 mg Per Tube BID  . glycopyrrolate  1 mg Oral TID  . insulin aspart  0-15 Units Subcutaneous TID WC  . insulin aspart  0-5 Units Subcutaneous QHS  . insulin detemir  5 Units Subcutaneous BID  . ipratropium-albuterol  3 mL Nebulization Q4H  . mouth rinse  15 mL Mouth Rinse 10 times per day  . mirtazapine  30 mg Oral QHS  . PARoxetine  20 mg Per Tube Daily  . Ensure Max Protein  11 oz Oral BID  . QUEtiapine  100 mg Per Tube QHS   Continuous Infusions: . sodium chloride 100 mL/hr at 09/02/19 1610  . dexmedetomidine (PRECEDEX) IV infusion Stopped (09/02/19 0650)    Assessment & Plan:   Principal Problem:   HCAP (healthcare-associated pneumonia) Active Problems:   Tobacco abuse   Malnutrition of moderate degree (HCC)   Sepsis ([place])   Acute on chronic respiratory failure with hypoxia (HCC)   HTN (hypertension)   DM (diabetes mellitus) ([place])   Pseudomonas respiratory infection   Hyperlipidemia associated with type 2 diabetes mellitus (HCC)   PEG (percutaneous endoscopic gastrostomy) status ([place])   Hypokalemia   Atypical pneumonia  Tracheitis versus healthcare associated pneumonia Chronic tracheostomy Acute on chronic hypoxic respiratory failure Symptoms suggestive of pneumonia Chest imaging negative Pulmonary following.  Continue full and IV support Continue bronchodilators Robinul added for  increased secretions from ETT Bed physiotherapy/chest PT I-S and flutter valve at bedside Tracheostomy care per RT Follow sputum and blood cultures  Diabetes, insulin-dependent Per home med rec patient is on 25 units Lantus twice daily Diabetes coordinator following Adding 5 units twice daily Lantus moderate sliding scale coverage Nightly coverage Carb modified diet  Hyperlipidemia Home statin  Hypertension Improved control over interval Clonidine patch in place As needed hydralazine   Hypokalemia Replace as needed, currently stabl.e  Anxiety History of cocaine use Counseled patient Resume home Klonopin, Remeron, Seroquel   DVT prophylaxis: Lovenox Code Status: DNR Family Communication: None at bedside Disposition Plan: Anticipate return to previous home environment with resolution of pneumonia Barriers still receiving treatment and needing vent support       LOS: 5 days   Time spent 45 minutes with more than 50% COC    Nolberto Hanlon, MD [facility] Pager [phone]  If 7PM-7AM, please contact night-coverage [email or web address] Password [facility] 09/02/2019, 4:48 PM

## 2019-09-02 NOTE — Consult Note (Signed)
PHARMACY CONSULT NOTE - FOLLOW UP  Pharmacy Consult for Electrolyte Monitoring and Replacement   Recent Labs: Potassium (mmol/L)  Date Value  09/02/2019 3.5  07/11/2014 4.6   Magnesium (mg/dL)  Date Value  09/02/2019 2.0  10/31/2013 2.6 (H)   Calcium (mg/dL)  Date Value  09/02/2019 8.5 (L)   Calcium, Total (mg/dL)  Date Value  07/11/2014 8.9   Albumin (g/dL)  Date Value  08/29/2019 3.3 (L)  07/09/2014 3.7   Phosphorus (mg/dL)  Date Value  09/02/2019 2.5  10/31/2013 4.6   Sodium (mmol/L)  Date Value  09/02/2019 145  07/11/2014 139   Corrected Ca: 9.06 mg/dL  Assessment: She was evaluated with respiratory cultures showing and growing Pseudomonas and S marcescens only resistant to fluouroqinolones. She  has copious secretions from ETT on oral glycopyrolate.  Patient was  admitted with possible tracheitis versus healthcare associated pneumonia now with treatment completed. Patient has significant history of recurrent hospitalizations.   Goal of Therapy:  Electrolytes WNL  Plan:   Potassium is borderline: 20 mEq oral KCl x1  Next electrolytes in am  Dallie Piles ,PharmD, BCPS Clinical Pharmacist 09/02/2019 9:00 AM

## 2019-09-02 NOTE — TOC Progression Note (Signed)
Transition of Care [facility]) - Progression Note    Patient Details  Name: Michelle [name] MRN: [id number] Date of Birth: Dec 08, 1960  Transition of Care [facility]) CM/SW [place], LCSW Phone Number: 09/02/2019, 11:16 AM  Clinical Narrative:   Call from Lisbeth Ply with [facility]. Provided update from rounds that patient is improving, no discharge orders/plans yet. Adonis Huguenin asked that discharge summary be faxed to her at [phone]. CSW will continue to follow.     Expected Discharge Plan: [place] Barriers to Discharge: Continued Medical Work up  Expected Discharge Plan and Services Expected Discharge Plan: [place] arrangements for the past 2 months: [place] Agency: [place] Date [place]: 08/31/19   Representative spoke with at [place]: Lisbeth Ply   Social Determinants of Health ([place]) Interventions    Readmission Risk Interventions Readmission Risk Prevention Plan 08/31/2019 08/07/2019 09/19/2018  Transportation Screening Complete - Complete  PCP or Specialist Appt within 5-7 Days - - Complete  Home Care Screening - - Complete  Medication Review (RN CM) - - Complete  Medication Review ([place]) Complete - -  PCP or Specialist appointment within 3-5 days of discharge Complete Complete -  [place] or Home Care Consult Complete Complete -  Palliative Care Screening Complete Complete -  [place] Complete - -  Some recent data might be hidden

## 2019-09-02 NOTE — Progress Notes (Signed)
CRITICAL CARE PROGRESS NOTE    Name: Michelle [name] MRN: [id number] DOB: 03-12-1961     LOS: 71   SUBJECTIVE FINDINGS & SIGNIFICANT EVENTS   59 year old female, DNR/DNI, with past medical history notable for severe COPD requiring chronic home vent via chronic tracheostomy admitted with acute on chronic respiratory failure secondary to trachitis vs. recurrent pneumonia   SIGNIFICANT EVENTS/STUDIES:  03/26: Pt initially admitted to the telemetry unit with pneumonia, but due to chronic home vent requirements pt changed to stepdown status and PCCM consulted 03/27 3/27 febrile episode 3/29- patient reports clinical improvement but still has high volume secretions from tracheostomy 3/30- patient with improved respiratory status, decreased rhonchi and clinical improvement 3/31- tracheostomy secretions have improved, optimizing for donwgrade to telemetry   PAST MEDICAL HISTORY   Past Medical History:  Diagnosis Date  . Allergy   . Anxiety   . Asthma   . CHF (congestive heart failure) ([place])   . Cocaine abuse ([place])   . COPD (chronic obstructive pulmonary disease) ([place])   . Coronary artery disease   . Emphysema/COPD ([place])   . Hepatitis C   . High cholesterol   . Hypertension   . Pneumothorax   . Tobacco abuse      SURGICAL HISTORY   Past Surgical History:  Procedure Laterality Date  . CARDIAC CATHETERIZATION    . CHEST TUBE INSERTION    . IR GASTROSTOMY TUBE MOD SED  10/31/2017  . TRACHEOSTOMY TUBE PLACEMENT N/A 10/17/2017   Procedure: TRACHEOSTOMY;  Surgeon: Carloyn Manner, MD;  Location: [facility] ORS;  Service: ENT;  Laterality: N/A;     FAMILY HISTORY   Family History  Problem Relation Age of Onset  . Lung cancer Mother   . Asthma Mother   . CVA Father   . CAD Father   . Stroke Father   .  Breast cancer Maternal Aunt 67  . COPD Sister      SOCIAL HISTORY   Social History   Tobacco Use  . Smoking status: Former Smoker    Packs/day: 0.10    Years: 41.00    Pack years: 4.10    Types: Cigarettes    Quit date: 06/09/2017    Years since quitting: 2.2  . Smokeless tobacco: Never Used  Substance Use Topics  . Alcohol use: No  . Drug use: Not Currently    Types: "Crack" cocaine    Comment: Past cocaine use.  Quit 04/2017     MEDICATIONS   Current Medication:  Current Facility-Administered Medications:  .  0.9 %  sodium chloride infusion, , Intravenous, Continuous, Sharion Settler, NP, Last Rate: 100 mL/hr at 09/02/19 1610, Rate Verify at 09/02/19 1610 .  acetaminophen (TYLENOL) tablet 650 mg, 650 mg, Oral, Q6H PRN, Flora Lipps, MD, 650 mg at 09/01/19 1938 .  aspirin chewable tablet 81 mg, 81 mg, Oral, Daily, Sreenath, Sudheer B, MD, 81 mg at 09/02/19 1016 .  budesonide (PULMICORT) nebulizer solution 0.5 mg, 0.5 mg, Nebulization, BID, Awilda Bill, NP, 0.5 mg at 09/02/19 0736 .  chlorhexidine (PERIDEX) 0.12 % solution 15 mL, 15 mL, Mouth Rinse, BID, Sharion Settler, NP, 15 mL at 09/02/19 0745 .  Chlorhexidine Gluconate Cloth 2 % PADS 6 each, 6 each, Topical, Daily, Priscella Mann, Sudheer B, MD, 6 each at 09/01/19 1026 .  clonazePAM (KLONOPIN) tablet 1.5 mg, 1.5 mg, Oral, TID, Hall, Scott A, RPH, 1.5 mg at 09/02/19 1504 .  cloNIDine (CATAPRES - Dosed in mg/24 hr) patch 0.1 mg, 0.1  mg, Transdermal, Weekly, Sreenath, Sudheer B, MD, 0.1 mg at 08/30/19 1104 .  dexmedetomidine (PRECEDEX) 400 MCG/100ML (4 mcg/mL) infusion, 0.4-1.2 mcg/kg/hr, Intravenous, Titrated, Bradly Bienenstock, NP, Stopped at 09/02/19 [phone] .  enoxaparin (LOVENOX) injection 40 mg, 40 mg, Subcutaneous, Q24H, Sharion Settler, NP, 40 mg at 09/01/19 2155 .  famotidine (PEPCID) tablet 20 mg, 20 mg, Per Tube, BID, Awilda Bill, NP, 20 mg at 09/02/19 1017 .  fluticasone (FLONASE) 50 MCG/ACT nasal spray 1  spray, 1 spray, Each Nare, Daily PRN, Sreenath, Sudheer B, MD .  glycopyrrolate (ROBINUL) tablet 1 mg, 1 mg, Oral, TID, Ottie Glazier, MD, 1 mg at 09/02/19 1504 .  guaiFENesin-dextromethorphan (ROBITUSSIN DM) 100-10 MG/5ML syrup 5 mL, 5 mL, Oral, Q6H PRN, Sreenath, Sudheer B, MD .  hydrALAZINE (APRESOLINE) injection 10-20 mg, 10-20 mg, Intravenous, Q4H PRN, Awilda Bill, NP .  insulin aspart (novoLOG) injection 0-15 Units, 0-15 Units, Subcutaneous, TID WC, Sharion Settler, NP, 2 Units at 09/02/19 1602 .  insulin aspart (novoLOG) injection 0-5 Units, 0-5 Units, Subcutaneous, QHS, Sharion Settler, NP, 3 Units at 08/30/19 2151 .  insulin detemir (LEVEMIR) injection 5 Units, 5 Units, Subcutaneous, BID, Priscella Mann, Sudheer B, MD, 5 Units at 09/02/19 1015 .  ipratropium-albuterol (DUONEB) 0.5-2.5 (3) MG/3ML nebulizer solution 3 mL, 3 mL, Nebulization, Q4H, Blakeney, Dana G, NP, 3 mL at 09/02/19 1523 .  ipratropium-albuterol (DUONEB) 0.5-2.5 (3) MG/3ML nebulizer solution 3 mL, 3 mL, Nebulization, Q6H PRN, Awilda Bill, NP, 3 mL at 08/31/19 1813 .  MEDLINE mouth rinse, 15 mL, Mouth Rinse, 10 times per day, Sharion Settler, NP, 15 mL at 09/02/19 1227 .  metoprolol tartrate (LOPRESSOR) injection 2.5-5 mg, 2.5-5 mg, Intravenous, Q6H PRN, Awilda Bill, NP .  mirtazapine (REMERON SOL-TAB) disintegrating tablet 30 mg, 30 mg, Oral, QHS, Sharion Settler, NP, 30 mg at 09/01/19 2156 .  morphine 2 MG/ML injection 1-2 mg, 1-2 mg, Intravenous, Q4H PRN, Awilda Bill, NP, 2 mg at 09/02/19 1507 .  oxyCODONE-acetaminophen (PERCOCET/ROXICET) 5-325 MG per tablet 1 tablet, 1 tablet, Oral, Q8H PRN, Awilda Bill, NP, 1 tablet at 09/02/19 0005 .  PARoxetine (PAXIL) tablet 20 mg, 20 mg, Per Tube, Daily, Sreenath, Sudheer B, MD, 20 mg at 09/02/19 1016 .  polyethylene glycol (MIRALAX / GLYCOLAX) packet 17 g, 17 g, Per Tube, Daily PRN, Sreenath, Sudheer B, MD .  protein supplement (ENSURE MAX) liquid, 11 oz,  Oral, BID, Nolberto Hanlon, MD, 11 oz at 09/01/19 2228 .  QUEtiapine (SEROQUEL) tablet 100 mg, 100 mg, Per Tube, QHS, Sharion Settler, NP, 100 mg at 09/01/19 2155 .  senna (SENOKOT) tablet 8.6-17.2 mg, 1-2 tablet, Per Tube, QHS PRN, Sharion Settler, NP    ALLERGIES   Amoxicillin and Ativan [lorazepam]    REVIEW OF SYSTEMS     10 point ROS done and is negative except for respiratory distress  PHYSICAL EXAMINATION   Vital Signs: Temp:  [97.3 F (36.3 C)-98.1 F (36.7 C)] 98.1 F (36.7 C) (03/31 1400) Pulse Rate:  [58-120] 86 (03/31 1600) Resp:  [18-23] 18 (03/31 1600) BP: (109-186)/(71-105) 153/86 (03/31 1600) SpO2:  [98 %-100 %] 100 % (03/31 1600) FiO2 (%):  [36 %] 36 % (03/31 0306)  GENERAL:chronically ill appearing HEAD: Normocephalic, atraumatic.  EYES: Pupils equal, round, reactive to light.  No scleral icterus.  MOUTH: Moist mucosal membrane. NECK: Supple. No thyromegaly. No nodules. No JVD.  PULMONARY: rhonchi bilaterally improved from previous CARDIOVASCULAR: S1 and S2. Regular rate and rhythm. No murmurs, rubs,  or gallops.  GASTROINTESTINAL: Soft, nontender, non-distended. No masses. Positive bowel sounds. No hepatosplenomegaly.  MUSCULOSKELETAL: No swelling, clubbing, or edema.  NEUROLOGIC: Mild distress due to acute illness SKIN:intact,warm,dry   PERTINENT DATA     Infusions: . sodium chloride 100 mL/hr at 09/02/19 1610  . dexmedetomidine (PRECEDEX) IV infusion Stopped (09/02/19 0650)   Scheduled Medications: . aspirin  81 mg Oral Daily  . budesonide  0.5 mg Nebulization BID  . chlorhexidine  15 mL Mouth Rinse BID  . Chlorhexidine Gluconate Cloth  6 each Topical Daily  . clonazePAM  1.5 mg Oral TID  . cloNIDine  0.1 mg Transdermal Weekly  . enoxaparin (LOVENOX) injection  40 mg Subcutaneous Q24H  . famotidine  20 mg Per Tube BID  . glycopyrrolate  1 mg Oral TID  . insulin aspart  0-15 Units Subcutaneous TID WC  . insulin aspart  0-5 Units  Subcutaneous QHS  . insulin detemir  5 Units Subcutaneous BID  . ipratropium-albuterol  3 mL Nebulization Q4H  . mouth rinse  15 mL Mouth Rinse 10 times per day  . mirtazapine  30 mg Oral QHS  . PARoxetine  20 mg Per Tube Daily  . Ensure Max Protein  11 oz Oral BID  . QUEtiapine  100 mg Per Tube QHS   PRN Medications: acetaminophen, fluticasone, guaiFENesin-dextromethorphan, hydrALAZINE, ipratropium-albuterol, metoprolol tartrate, morphine injection, oxyCODONE-acetaminophen, polyethylene glycol, senna Hemodynamic parameters:   Intake/Output: 03/30 0701 - 03/31 0700 In: 2193.6 [I.V.:2093.6; IV Piggyback:100] Out: 2501 [Urine:2500; Stool:1]  Ventilator  Settings: Vent Mode: AC FiO2 (%):  [36 %] 36 % Set Rate:  [20 bmp] 20 bmp Vt Set:  [450 mL] 450 mL PEEP:  [5 cmH20] 5 cmH20   LAB RESULTS:  Basic Metabolic Panel: Recent Labs  Lab 08/29/19 0212 08/29/19 0212 08/30/19 1442 08/30/19 1442 08/31/19 0611 08/31/19 0611 09/01/19 0420 09/02/19 0421  NA 139  --  142  --  143  --  143 145  K 3.3*   < > 4.0   < > 4.4   < > 4.6 3.5  CL 109  --  112*  --  116*  --  111 115*  CO2 21*  --  19*  --  22  --  21* 23  GLUCOSE 137*  --  200*  --  173*  --  187* 123*  BUN 21*  --  12  --  11  --  15 19  CREATININE 0.73  --  0.63  --  0.50  --  0.61 0.70  CALCIUM 8.2*  --  8.5*  --  8.9  --  8.7* 8.5*  MG  --   --  1.9  --  1.9  --  1.9 2.0  PHOS  --   --  1.5*  --  1.5*  --  2.3* 2.5   < > = values in this interval not displayed.   Liver Function Tests: Recent Labs  Lab 08/28/19 1635 08/29/19 0212  AST 15 14*  ALT 15 13  ALKPHOS 101 82  BILITOT 0.8 0.8  PROT 7.8 6.6  ALBUMIN 4.0 3.3*   No results for input(s): LIPASE, AMYLASE in the last 168 hours. No results for input(s): AMMONIA in the last 168 hours. CBC: Recent Labs  Lab 08/28/19 1635 08/28/19 1635 08/29/19 0212 08/30/19 1442 08/31/19 0611 09/01/19 0420 09/02/19 0421  WBC 12.0*   < > 7.2 7.5 7.0 7.9 6.3   NEUTROABS 9.1*  --   --  6.8 6.0 7.0 4.4  HGB 13.8   < > 11.6* 11.1* 10.7* 11.3* 10.6*  HCT 43.8   < > 37.4 35.9* 34.7* 36.8 34.7*  MCV 82.8   < > 84.8 84.3 83.6 84.8 84.6  PLT 319   < > 228 194 196 217 227   < > = values in this interval not displayed.   Cardiac Enzymes: No results for input(s): CKTOTAL, CKMB, CKMBINDEX, TROPONINI in the last 168 hours. BNP: Invalid input(s): POCBNP CBG: Recent Labs  Lab 09/01/19 1935 09/01/19 2201 09/02/19 0713 09/02/19 1146 09/02/19 1540  GLUCAP 210* 96 145* 95 124*     IMAGING RESULTS:   ASSESSMENT AND PLAN    -Multidisciplinary rounds held today  Acute Hypoxic Respiratory Failure -continue Full NIV support -continue Bronchodilator Therapy -Wean Fio2  -patient with increased secretions from ETT - added rubinol - continue COPD carepath -chest PT - will add bed physiotherapy -IS and Flutter valve at bedside -Tracheostomy care per RN/RT   GI/Nutrition GI PROPHYLAXIS as indicated DIET-->TF's as tolerated Constipation protocol as indicated   ENDO - ICU hypoglycemic\Hyperglycemia protocol -check FSBS per protocol   ELECTROLYTES -follow labs as needed -replace as needed -pharmacy consultation   DVT/GI PRX ordered -SCDs  TRANSFUSIONS AS NEEDED MONITOR FSBS ASSESS the need for LABS as needed   Critical care provider statement:    Critical care time (minutes):  33   Critical care time was exclusive of:  Separately billable procedures and treating other patients   Critical care was necessary to treat or prevent imminent or life-threatening deterioration of the following conditions:  Acute on chronic hypoxemic respiratory failure, advanced COPD, tracheostomy status, multiple comorbid conditions.    Critical care was time spent personally by me on the following activities:  Development of treatment plan with patient or surrogate, discussions with consultants, evaluation of patient's response to treatment, examination of  patient, obtaining history from patient or surrogate, ordering and performing treatments and interventions, ordering and review of laboratory studies and re-evaluation of patient's condition.  I assumed direction of critical care for this patient from another provider in my specialty: no    This document was prepared using Dragon voice recognition software and may include unintentional dictation errors.    Ottie Glazier, M.D.  Division of [place]

## 2019-09-03 LAB — BASIC METABOLIC PANEL
Anion gap: 9 (ref 5–15)
BUN: 19 mg/dL (ref 6–20)
CO2: 24 mmol/L (ref 22–32)
Calcium: 8.3 mg/dL — ABNORMAL LOW (ref 8.9–10.3)
Chloride: 111 mmol/L (ref 98–111)
Creatinine, Ser: 0.68 mg/dL (ref 0.44–1.00)
GFR calc Af Amer: >60 mL/min (ref >60)
GFR calc non Af Amer: >60 mL/min (ref >60)
Glucose, Bld: 114 mg/dL — ABNORMAL HIGH (ref 70–99)
Potassium: 3.3 mmol/L — ABNORMAL LOW (ref 3.5–5.1)
Sodium: 144 mmol/L (ref 135–145)

## 2019-09-03 LAB — CBC WITH DIFFERENTIAL/PLATELET
Abs Immature Granulocytes: 0.03 10*3/uL (ref 0.00–0.07)
Basophils Absolute: 0 10*3/uL (ref 0.0–0.1)
Basophils Relative: 0 %
Eosinophils Absolute: 0.2 10*3/uL (ref 0.0–0.5)
Eosinophils Relative: 5 %
HCT: 35.7 % — ABNORMAL LOW (ref 36.0–46.0)
Hemoglobin: 10.9 g/dL — ABNORMAL LOW (ref 12.0–15.0)
Immature Granulocytes: 1 %
Lymphocytes Relative: 23 %
Lymphs Abs: 1.1 10*3/uL (ref 0.7–4.0)
MCH: 26.3 pg (ref 26.0–34.0)
MCHC: 30.5 g/dL (ref 30.0–36.0)
MCV: 86 fL (ref 80.0–100.0)
Monocytes Absolute: 0.6 10*3/uL (ref 0.1–1.0)
Monocytes Relative: 12 %
Neutro Abs: 2.9 10*3/uL (ref 1.7–7.7)
Neutrophils Relative %: 59 %
Platelets: 172 10*3/uL (ref 150–400)
RBC: 4.15 MIL/uL (ref 3.87–5.11)
RDW: 16.2 % — ABNORMAL HIGH (ref 11.5–15.5)
WBC: 4.9 10*3/uL (ref 4.0–10.5)
nRBC: 0 % (ref 0.0–0.2)

## 2019-09-03 LAB — GLUCOSE, CAPILLARY
Glucose-Capillary: 100 mg/dL — ABNORMAL HIGH (ref 70–99)
Glucose-Capillary: 98 mg/dL (ref 70–99)

## 2019-09-03 LAB — PHOSPHORUS: Phosphorus: 3 mg/dL (ref 2.5–4.6)

## 2019-09-03 LAB — MAGNESIUM: Magnesium: 1.9 mg/dL (ref 1.7–2.4)

## 2019-09-03 MED ORDER — POTASSIUM CHLORIDE 20 MEQ PO PACK
40.0000 meq | PACK | Freq: Once | ORAL | Status: AC
  Administered 2019-09-03: 40 meq via ORAL
  Filled 2019-09-03: qty 2

## 2019-09-03 MED ORDER — GLYCOPYRROLATE 1 MG PO TABS: 1.0000 mg | ORAL_TABLET | Freq: Three times a day (TID) | ORAL | 0 refills | Status: DC

## 2019-09-03 MED ORDER — BUDESONIDE 0.5 MG/2ML IN SUSP: 0.5000 mg | Freq: Two times a day (BID) | RESPIRATORY_TRACT | Status: DC

## 2019-09-03 NOTE — Discharge Summary (Addendum)
Michelle [name] DXA:[id number] DOB: 20-Oct-1960 DOA: 08/28/2019  PCP: Frazier Richards, MD  Admit date: 08/28/2019 Discharge date: 09/03/2019  Admitted From: Home Disposition: Home  Recommendations for Outpatient Follow-up:  1. Follow up with PCP in 1 week 2. Please obtain BMP/CBC in one week 3. Follow-up with primary pulmonologist in 1 week  Home Health: Yes-home health RN   Discharge Condition:Stable CODE STATUS: DNR  diet recommendation:  Carb Modified Brief/Interim Summary: H&P: Michelle [name] is a 59 y.o. female with medical history significant of severe COPD with chronic tracheostomy, CHF, asthma, previous cocaine abuse, hypertension, coronary artery disease, hepatitis C, history of pneumothorax and tobacco abuse, morbid obesity who was recently admitted on March 4 with sepsis and respiratory failure requiring ICU admission.  Patient at the end of the stay was discharged home home with home health.  She was evaluated with cultures showing and growing Pseudomonas only sensitive to cefepime.  She came back this time with early fevers shortness of breath as well as copious secretions.  She had also leukocytosis and early's signs of pneumonia on x-ray.  Patient is being admitted with possible tracheitis versus healthcare associated pneumonia.  Patient has significant history of recurrent hospitalizations and has a tracheostomy in place. She was admitted to the SDU with pulmonary consultation. Started initially on iv cefepime since previous cultures grew Pseudomonas, however was discontinued since has colonization with this bacteria per PCCM.  She was placed on vent for Full NIV support , started on bronchodilators. Weaned of FI02. Had bed physiotherapy and chest PT. She was having a lot of secretions from her trach site and pulmonary started her on Robinul with improvement.  Per pulmonary patient is stable to be discharged today.  Discharge Diagnoses:  Principal Problem:   HCAP  (healthcare-associated pneumonia) Active Problems:   Tobacco abuse   Malnutrition of moderate degree (HCC)   Sepsis ([place])   Acute on chronic respiratory failure with hypoxia (HCC)   HTN (hypertension)   DM (diabetes mellitus) ([place])   Pseudomonas respiratory infection   Hyperlipidemia associated with type 2 diabetes mellitus (HCC)   PEG (percutaneous endoscopic gastrostomy) status ([place])   Hypokalemia   Atypical pneumonia    Discharge Instructions  Discharge Instructions    Call MD for:  temperature >100.4   Complete by: As directed    Diet - low sodium heart healthy   Complete by: As directed    Increase activity slowly   Complete by: As directed      Allergies as of 09/03/2019      Reactions   Amoxicillin    Patient has tolerated cephalosporins in the past   Ativan [lorazepam]    Makes anxiety worse       Medication List    STOP taking these medications   levofloxacin 750 MG tablet Commonly known as: LEVAQUIN   predniSONE 20 MG tablet Commonly known as: DELTASONE     TAKE these medications   aspirin 81 MG chewable tablet Chew 81 mg by mouth daily.   blood glucose meter kit and supplies Kit Dispense based on patient and insurance preference. Use up to four times daily as directed. (FOR ICD-9 250.00, 250.01).   budesonide 0.25 MG/2ML nebulizer solution Commonly known as: PULMICORT Take 2 mLs (0.25 mg total) by nebulization 2 (two) times daily. What changed: how much to take   cetirizine 10 MG tablet Commonly known as: ZYRTEC Place 10 mg into feeding tube daily as needed.   clonazePAM 0.5 MG tablet  Commonly known as: KLONOPIN Take 3 tablets (1.5 mg total) by mouth 3 (three) times daily.   cloNIDine 0.1 mg/24hr patch Commonly known as: CATAPRES - Dosed in mg/24 hr Place 0.1 mg onto the skin once a week.   Ensure Max Protein Liqd Take 330 mLs (11 oz total) by mouth 2 (two) times daily.   famotidine 20 MG tablet Commonly known as: PEPCID Place 1  tablet (20 mg total) into feeding tube daily.   ferrous sulfate 325 (65 FE) MG tablet Take 325 mg by mouth every other day.   fluticasone 50 MCG/ACT nasal spray Commonly known as: FLONASE Place 1 spray into both nostrils daily as needed.   glycopyrrolate 1 MG tablet Commonly known as: ROBINUL Take 1 tablet (1 mg total) by mouth 3 (three) times daily.   guaiFENesin 100 MG/5ML Soln Commonly known as: ROBITUSSIN Take 5 mLs (100 mg total) by mouth every 4 (four) hours as needed for cough or to loosen phlegm.   guaiFENesin-dextromethorphan 100-10 MG/5ML syrup Commonly known as: ROBITUSSIN DM Take 10 mLs by mouth every 6 (six) hours as needed for cough. What changed: how much to take   insulin aspart 100 UNIT/ML injection Commonly known as: novoLOG Inject 0-20 Units into the skin 3 (three) times daily with meals.   insulin detemir 100 UNIT/ML injection Commonly known as: LEVEMIR Inject 0.25 mLs (25 Units total) into the skin 2 (two) times daily.   ipratropium-albuterol 0.5-2.5 (3) MG/3ML Soln Commonly known as: DUONEB Take 3 mLs by nebulization every 6 (six) hours. What changed:   when to take this  reasons to take this   mirtazapine 30 MG disintegrating tablet Commonly known as: REMERON SOL-TAB Take 1 tablet (30 mg total) by mouth at bedtime.   oxyCODONE-acetaminophen 5-325 MG tablet Commonly known as: PERCOCET/ROXICET Take 1 tablet by mouth every 8 (eight) hours as needed.   PARoxetine 20 MG tablet Commonly known as: PAXIL Place 1 tablet (20 mg total) into feeding tube at bedtime. What changed: when to take this   Pen Needles 32G X 6 MM Misc 1 each by Does not apply route 4 (four) times daily - after meals and at bedtime.   polyethylene glycol 17 g packet Commonly known as: MIRALAX / GLYCOLAX Place 17 g into feeding tube daily as needed.   QUEtiapine 100 MG tablet Commonly known as: SEROQUEL Place 1 tablet (100 mg total) into feeding tube at bedtime.   senna  8.6 MG Tabs tablet Commonly known as: SENOKOT Place 1-2 tablets into feeding tube at bedtime as needed for mild constipation.      Follow-up Information    Adamo, Hattie Perch, MD Follow up.   Specialty: Family Medicine Why: Please schedule PCP follow up within 5-7 days of discharge. Contact information: [place] [address] [phone]        Carrie Mew, MD Follow up in 1 week(s).   Specialty: Internal Medicine Contact information: [address] [phone], [place] [address] [phone]          Allergies  Allergen Reactions  . Amoxicillin     Patient has tolerated cephalosporins in the past  . Ativan [Lorazepam]     Makes anxiety worse     Consultations:  Pulmonary   Procedures/Studies: DG Abd 1 View  Result Date: 08/07/2019 CLINICAL DATA:  Abdominal distension EXAM: ABDOMEN - 1 VIEW COMPARISON:  None. FINDINGS: Scattered large and small bowel gas is noted. Gastrostomy catheter  is noted within the stomach. The stomach is well distended with air. No free air is seen. Changes of prior tubal ligation are noted. No acute bony abnormality is seen. IMPRESSION: No acute abnormality noted. Electronically Signed   By: Inez Catalina M.D.   On: 08/07/2019 02:23   CT ABDOMEN PELVIS W CONTRAST  Result Date: 08/28/2019 CLINICAL DATA:  Nausea and vomiting. Left-sided flank and abdominal pain. EXAM: CT ABDOMEN AND PELVIS WITH CONTRAST TECHNIQUE: Multidetector CT imaging of the abdomen and pelvis was performed using the standard protocol following bolus administration of intravenous contrast. CONTRAST:  155m OMNIPAQUE IOHEXOL 300 MG/ML  SOLN COMPARISON:  Abdominal radiograph 08/07/2019 FINDINGS: Lower chest: Emphysema. Linear opacity in the right lower lobe favors atelectasis. Heart is normal in size. Hepatobiliary: Decreased hepatic density consistent with steatosis. More focal fatty infiltration adjacent to the falciform ligament. No  discrete focal hepatic lesion. Gallbladder physiologically distended. No calcified gallstone or pericholecystic inflammation. There is mild extrahepatic biliary ductal dilatation with common bile duct measuring 8 mm distally. Pancreas: No acute peripancreatic inflammation. There is proximal pancreatic ductal dilatation at 6 mm. No evidence of pancreatic cyst or focal lesion. Spleen: Normal in size without focal abnormality. Adrenals/Urinary Tract: No adrenal nodule. No hydronephrosis or perinephric edema. Homogeneous renal enhancement with symmetric excretion on delayed phase imaging. 16 mm cyst in the mid right kidney. Additional tiny hypodensity in the lower right kidney is incompletely characterized due to size. Urinary bladder is physiologically distended without wall thickening. Stomach/Bowel: Gastrostomy tube appropriately positioned in the stomach. No abnormality along the gastrostomy tube tract. Stomach is decompressed and unremarkable. No small bowel inflammation or obstruction. Normal appendix. Small to moderate colonic stool burden. No colonic wall thickening or inflammation. No significant diverticular disease. Vascular/Lymphatic: Aorto bi-iliac atherosclerosis, moderate but advanced for age. No aortic aneurysm. The portal vein is patent. No acute vascular findings. No abdominopelvic adenopathy. Reproductive: Bilateral tubal ligation clips. Uterus unremarkable. Ovaries quiescent. No adnexal mass. Other: No free air, free fluid, or intra-abdominal fluid collection. Small fat containing umbilical hernia. Soft tissue densities in the lower anterior abdominal wall typical of medication injection sites. Musculoskeletal: There are no acute or suspicious osseous abnormalities. Degenerative disc disease at L5-S1. IMPRESSION: 1. Mild extrahepatic biliary ductal dilatation and proximal pancreatic ductal dilatation, of uncertain etiology and chronicity. No significant gallbladder dilatation or evidence of focal  pancreatic or obstructing lesion. Recommend correlation with LFTs. If elevated, recommend further evaluation with MRCP or ERCP. MRCP should only be considered if patient is able to tolerate breath hold technique, and could be performed on an elective outpatient basis. 2. No additional acute abnormality in the abdomen/pelvis. 3. Hepatic steatosis. 4. Aorto bi-iliac atherosclerosis, age advanced. 5. Gastrostomy tube appropriately positioned in the stomach. Aortic Atherosclerosis (ICD10-I70.0). Electronically Signed   By: MKeith RakeM.D.   On: 08/28/2019 19:20   DG Chest Portable 1 View  Result Date: 08/28/2019 CLINICAL DATA:  Difficulty breathing. EXAM: PORTABLE CHEST 1 VIEW COMPARISON:  August 06, 2019 FINDINGS: There is stable tracheostomy tube positioning. The lungs are hyperinflated. There is no evidence of acute infiltrate, pleural effusion or pneumothorax. The heart size and mediastinal contours are within normal limits. The visualized skeletal structures are unremarkable. IMPRESSION: No active disease. Electronically Signed   By: TVirgina NorfolkM.D.   On: 08/28/2019 17:31   DG Chest Port 1 View  Result Date: 08/06/2019 CLINICAL DATA:  Respiratory failure and fevers EXAM: PORTABLE CHEST 1 VIEW COMPARISON:  06/27/2019 FINDINGS: Tracheostomy tube is again  seen in satisfactory position. Lungs are well aerated bilaterally. No focal infiltrate or sizable effusion is seen. No bony abnormality is noted. Cardiac shadow is within normal limits. No bony abnormality is noted. IMPRESSION: No active disease. Electronically Signed   By: Inez Catalina M.D.   On: 08/06/2019 23:12       Subjective: Doing much better.  Has no complaints.  Discharge Exam: Vitals:   09/03/19 1000 09/03/19 1100  BP:  (!) 174/89  Pulse: 74 81  Resp: 20 20  Temp:  98.7 F (37.1 C)  SpO2: 96% 97%   Vitals:   09/03/19 0200 09/03/19 0800 09/03/19 1000 09/03/19 1100  BP: 91/61   (!) 174/89  Pulse: 64 60 74 81  Resp: _0 Temp:    98.7 F (37.1 C)  TempSrc:    Axillary  SpO2: 98% 99% 96% 97%  Weight:      Height:        General: Pt is alert, awake, not in acute distress, on trach Cardiovascular: RRR, S1/S2 +, no rubs, no gallops Respiratory: CTA bilaterally, no wheezing, no rhonchi Abdominal: Soft, NT, ND, bowel sounds + Extremities: mild edema    The results of significant diagnostics from this hospitalization (including imaging, microbiology, ancillary and laboratory) are listed below for reference.     Microbiology: Recent Results (from the past 240 hour(s))  Blood Culture (routine x 2)     Status: None   Collection Time: 08/28/19  4:35 PM   Specimen: BLOOD  Result Value Ref Range Status   Specimen Description BLOOD R H  Final   Special Requests   Final    BOTTLES DRAWN AEROBIC AND ANAEROBIC Blood Culture results may not be optimal due to an inadequate volume of blood received in culture bottles   Culture   Final    NO GROWTH 5 DAYS Performed at [facility], [address] [place], [place] [address]    Report Status 09/02/2019 FINAL  Final  Urine culture     Status: None   Collection Time: 08/28/19  5:12 PM   Specimen: In/Out Cath Urine  Result Value Ref Range Status   Specimen Description   Final    IN/OUT CATH URINE Performed at [facility], [address], [place], [place] [address]    Special Requests   Final    NONE Performed at [facility], [address] [place], [place] [address]    Culture   Final    NO GROWTH Performed at [facility], [place] [address] [place], [place] [address]    Report Status 08/30/2019 FINAL  Final  Culture, respiratory (non-expectorated)     Status: None   Collection Time: 08/28/19  5:12 PM   Specimen: Tracheal Aspirate; Respiratory  Result Value Ref Range Status   Specimen Description   Final    TRACHEAL ASPIRATE Performed at [facility], [address], [place], [place] [address]     Special Requests   Final    Normal Performed at [facility], [place], [place] [address]    Gram Stain   Final    FEW WBC PRESENT,BOTH PMN AND MONONUCLEAR FEW SQUAMOUS EPITHELIAL CELLS PRESENT ABUNDANT GRAM NEGATIVE RODS FEW GRAM POSITIVE COCCI IN CLUSTERS FEW GRAM POSITIVE RODS FEW YEAST Performed at [facility], [place] [address] [place], [place] [address]    Culture   Final    ABUNDANT PSEUDOMONAS AERUGINOSA MODERATE  SERRATIA MARCESCENS    Report Status 09/01/2019 FINAL  Final   Organism ID, Bacteria PSEUDOMONAS AERUGINOSA  Final   Organism ID, Bacteria SERRATIA MARCESCENS  Final      Susceptibility   Pseudomonas aeruginosa - MIC*    CEFTAZIDIME 8 SENSITIVE Sensitive     CIPROFLOXACIN >=4 RESISTANT Resistant     GENTAMICIN 4 SENSITIVE Sensitive     IMIPENEM 2 SENSITIVE Sensitive     * ABUNDANT PSEUDOMONAS AERUGINOSA   Serratia marcescens - MIC*    CEFTAZIDIME 8 SENSITIVE Sensitive     CIPROFLOXACIN >=4 RESISTANT Resistant     GENTAMICIN 4 SENSITIVE Sensitive     IMIPENEM 2 SENSITIVE Sensitive     * MODERATE SERRATIA MARCESCENS  Blood Culture (routine x 2)     Status: None   Collection Time: 08/28/19  5:28 PM   Specimen: BLOOD  Result Value Ref Range Status   Specimen Description BLOOD RAC  Final   Special Requests   Final    BOTTLES DRAWN AEROBIC AND ANAEROBIC Blood Culture adequate volume   Culture   Final    NO GROWTH 5 DAYS Performed at [facility], [address] [place], [place] [address]    Report Status 09/02/2019 FINAL  Final  Respiratory Panel by RT PCR (Flu A&B, Covid) - Nasopharyngeal Swab     Status: None   Collection Time: 08/28/19 10:08 PM   Specimen: Nasopharyngeal Swab  Result Value Ref Range Status   SARS Coronavirus 2 by RT PCR NEGATIVE NEGATIVE Final    Comment: (NOTE) SARS-CoV-2 target nucleic acids are NOT DETECTED. The SARS-CoV-2 RNA is generally detectable in upper respiratoy specimens during  the acute phase of infection. The lowest concentration of SARS-CoV-2 viral copies this assay can detect is 131 copies/mL. A negative result does not preclude SARS-Cov-2 infection and should not be used as the sole basis for treatment or other patient management decisions. A negative result may occur with  improper specimen collection/handling, submission of specimen other than nasopharyngeal swab, presence of viral mutation(s) within the areas targeted by this assay, and inadequate number of viral copies (<131 copies/mL). A negative result must be combined with clinical observations, patient history, and epidemiological information. The expected result is Negative. Fact Sheet for Patients:  PinkCheek.be Fact Sheet for Healthcare Providers:  GravelBags.it This test is not yet ap proved or cleared by the [place] FDA and  has been authorized for detection and/or diagnosis of SARS-CoV-2 by FDA under an Emergency Use Authorization (EUA). This EUA will remain  in effect (meaning this test can be used) for the duration of the COVID-19 declaration under Section 564(b)(1) of the Act, 21 U.S.C. section 360bbb-3(b)(1), unless the authorization is terminated or revoked sooner.    Influenza A by PCR NEGATIVE NEGATIVE Final   Influenza B by PCR NEGATIVE NEGATIVE Final    Comment: (NOTE) The Xpert Xpress SARS-CoV-2/FLU/RSV assay is intended as an aid in  the diagnosis of influenza from Nasopharyngeal swab specimens and  should not be used as a sole basis for treatment. Nasal washings and  aspirates are unacceptable for Xpert Xpress SARS-CoV-2/FLU/RSV  testing. Fact Sheet for Patients: PinkCheek.be Fact Sheet for Healthcare Providers: GravelBags.it This test is not yet approved or cleared by the [place] FDA and  has been authorized for detection and/or diagnosis of  SARS-CoV-2 by  FDA under an Emergency Use Authorization (EUA). This EUA will remain  in effect (meaning this test can be used) for the duration of  the  Covid-19 declaration under Section 564(b)(1) of the Act, 21  U.S.C. section 360bbb-3(b)(1), unless the authorization is  terminated or revoked. Performed at [facility], [address] [place], [place] [address]   MRSA PCR Screening     Status: None   Collection Time: 08/29/19  2:12 AM   Specimen: Nasopharyngeal  Result Value Ref Range Status   MRSA by PCR NEGATIVE NEGATIVE Final    Comment:        The GeneXpert MRSA Assay (FDA approved for NASAL specimens only), is one component of a comprehensive MRSA colonization surveillance program. It is not intended to diagnose MRSA infection nor to guide or monitor treatment for MRSA infections. Performed at [facility], [address] [place], [place] [address]      Labs: BNP (last 3 results) Recent Labs    09/17/18 0120 03/08/19 1009 06/24/19 2057  BNP 44.0 27.0 20.1   Basic Metabolic Panel: Recent Labs  Lab 08/30/19 1442 08/31/19 0611 09/01/19 0420 09/02/19 0421 09/03/19 0433  NA 142 143 143 145 144  K 4.0 4.4 4.6 3.5 3.3*  CL 112* 116* 111 115* 111  CO2 19* 22 21* 23 24  GLUCOSE 200* 173* 187* 123* 114*  BUN _0 CREATININE 0.63 0.50 0.61 0.70 0.68  CALCIUM 8.5* 8.9 8.7* 8.5* 8.3*  MG 1.9 1.9 1.9 2.0 1.9  PHOS 1.5* 1.5* 2.3* 2.5 3.0   Liver Function Tests: Recent Labs  Lab 08/28/19 1635 08/29/19 0212  AST 15 14*  ALT 15 13  ALKPHOS 101 82  BILITOT 0.8 0.8  PROT 7.8 6.6  ALBUMIN 4.0 3.3*   No results for input(s): LIPASE, AMYLASE in the last 168 hours. No results for input(s): AMMONIA in the last 168 hours. CBC: Recent Labs  Lab 08/30/19 1442 08/31/19 0611 09/01/19 0420 09/02/19 0421 09/03/19 0433  WBC 7.5 7.0 7.9 6.3 4.9  NEUTROABS 6.8 6.0 7.0 4.4 2.9  HGB 11.1* 10.7* 11.3* 10.6* 10.9*  HCT 35.9* 34.7* 36.8  34.7* 35.7*  MCV 84.3 83.6 84.8 84.6 86.0  PLT 194 196 217 227 172   Cardiac Enzymes: No results for input(s): CKTOTAL, CKMB, CKMBINDEX, TROPONINI in the last 168 hours. BNP: Invalid input(s): POCBNP CBG: Recent Labs  Lab 09/02/19 0713 09/02/19 1146 09/02/19 1540 09/02/19 2052 09/03/19 0739  GLUCAP 145* 95 124* 115* 100*   D-Dimer No results for input(s): DDIMER in the last 72 hours. Hgb A1c No results for input(s): HGBA1C in the last 72 hours. Lipid Profile No results for input(s): CHOL, HDL, LDLCALC, TRIG, CHOLHDL, LDLDIRECT in the last 72 hours. Thyroid function studies No results for input(s): TSH, T4TOTAL, T3FREE, THYROIDAB in the last 72 hours.  Invalid input(s): FREET3 Anemia work up No results for input(s): VITAMINB12, FOLATE, FERRITIN, TIBC, IRON, RETICCTPCT in the last 72 hours. Urinalysis    Component Value Date/Time   COLORURINE YELLOW (A) 08/28/2019 1712   APPEARANCEUR CLEAR (A) 08/28/2019 1712   APPEARANCEUR Clear 10/30/2013 1230   LABSPEC >1.046 (H) 08/28/2019 1712   LABSPEC 1.005 10/30/2013 1230   PHURINE 6.0 08/28/2019 1712   GLUCOSEU NEGATIVE 08/28/2019 1712   GLUCOSEU Negative 10/30/2013 1230   HGBUR NEGATIVE 08/28/2019 1712   BILIRUBINUR NEGATIVE 08/28/2019 1712   BILIRUBINUR Negative 10/30/2013 1230   KETONESUR NEGATIVE 08/28/2019 1712   PROTEINUR 30 (A) 08/28/2019 1712   NITRITE NEGATIVE 08/28/2019 1712   LEUKOCYTESUR NEGATIVE 08/28/2019 1712   LEUKOCYTESUR Negative 10/30/2013 1230   Sepsis Labs Invalid  input(s): PROCALCITONIN,  WBC,  LACTICIDVEN Microbiology Recent Results (from the past 240 hour(s))  Blood Culture (routine x 2)     Status: None   Collection Time: 08/28/19  4:35 PM   Specimen: BLOOD  Result Value Ref Range Status   Specimen Description BLOOD R H  Final   Special Requests   Final    BOTTLES DRAWN AEROBIC AND ANAEROBIC Blood Culture results may not be optimal due to an inadequate volume of blood received in culture  bottles   Culture   Final    NO GROWTH 5 DAYS Performed at [facility], [address] [place], [place] [address]    Report Status 09/02/2019 FINAL  Final  Urine culture     Status: None   Collection Time: 08/28/19  5:12 PM   Specimen: In/Out Cath Urine  Result Value Ref Range Status   Specimen Description   Final    IN/OUT CATH URINE Performed at [facility], [address], [place], [place] [address]    Special Requests   Final    NONE Performed at [facility], [address], [place], [place] [address]    Culture   Final    NO GROWTH Performed at [facility], [place] [address] [place], [place] [address]    Report Status 08/30/2019 FINAL  Final  Culture, respiratory (non-expectorated)     Status: None   Collection Time: 08/28/19  5:12 PM   Specimen: Tracheal Aspirate; Respiratory  Result Value Ref Range Status   Specimen Description   Final    TRACHEAL ASPIRATE Performed at [facility], [address], [place], [place] [address]    Special Requests   Final    Normal Performed at [facility], [place]  [address]    Gram Stain   Final    FEW WBC PRESENT,BOTH PMN AND MONONUCLEAR FEW SQUAMOUS EPITHELIAL CELLS PRESENT ABUNDANT GRAM NEGATIVE RODS FEW GRAM POSITIVE COCCI IN CLUSTERS FEW GRAM POSITIVE RODS FEW YEAST Performed at [facility], [place] [address], [place], [place] [address]    Culture   Final    ABUNDANT PSEUDOMONAS AERUGINOSA MODERATE SERRATIA MARCESCENS    Report Status 09/01/2019 FINAL  Final   Organism ID, Bacteria PSEUDOMONAS AERUGINOSA  Final   Organism ID, Bacteria SERRATIA MARCESCENS  Final      Susceptibility   Pseudomonas aeruginosa - MIC*    CEFTAZIDIME 8 SENSITIVE Sensitive     CIPROFLOXACIN >=4 RESISTANT Resistant     GENTAMICIN 4 SENSITIVE Sensitive     IMIPENEM 2 SENSITIVE Sensitive     * ABUNDANT PSEUDOMONAS AERUGINOSA   Serratia marcescens - MIC*     CEFTAZIDIME 8 SENSITIVE Sensitive     CIPROFLOXACIN >=4 RESISTANT Resistant     GENTAMICIN 4 SENSITIVE Sensitive     IMIPENEM 2 SENSITIVE Sensitive     * MODERATE SERRATIA MARCESCENS  Blood Culture (routine x 2)     Status: None   Collection Time: 08/28/19  5:28 PM   Specimen: BLOOD  Result Value Ref Range Status   Specimen Description BLOOD RAC  Final   Special Requests   Final    BOTTLES DRAWN AEROBIC AND ANAEROBIC Blood Culture adequate volume   Culture   Final    NO GROWTH 5 DAYS Performed at [facility], [address], [place], [place] [address]    Report Status 09/02/2019 FINAL  Final  Respiratory Panel by RT PCR (Flu  A&B, Covid) - Nasopharyngeal Swab     Status: None   Collection Time: 08/28/19 10:08 PM   Specimen: Nasopharyngeal Swab  Result Value Ref Range Status   SARS Coronavirus 2 by RT PCR NEGATIVE NEGATIVE Final    Comment: (NOTE) SARS-CoV-2 target nucleic acids are NOT DETECTED. The SARS-CoV-2 RNA is generally detectable in upper respiratoy specimens during the acute phase of infection. The lowest concentration of SARS-CoV-2 viral copies this assay can detect is 131 copies/mL. A negative result does not preclude SARS-Cov-2 infection and should not be used as the sole basis for treatment or other patient management decisions. A negative result may occur with  improper specimen collection/handling, submission of specimen other than nasopharyngeal swab, presence of viral mutation(s) within the areas targeted by this assay, and inadequate number of viral copies (<131 copies/mL). A negative result must be combined with clinical observations, patient history, and epidemiological information. The expected result is Negative. Fact Sheet for Patients:  PinkCheek.be Fact Sheet for Healthcare Providers:  GravelBags.it This test is not yet ap proved or cleared by the [place] FDA and  has been  authorized for detection and/or diagnosis of SARS-CoV-2 by FDA under an Emergency Use Authorization (EUA). This EUA will remain  in effect (meaning this test can be used) for the duration of the COVID-19 declaration under Section 564(b)(1) of the Act, 21 U.S.C. section 360bbb-3(b)(1), unless the authorization is terminated or revoked sooner.    Influenza A by PCR NEGATIVE NEGATIVE Final   Influenza B by PCR NEGATIVE NEGATIVE Final    Comment: (NOTE) The Xpert Xpress SARS-CoV-2/FLU/RSV assay is intended as an aid in  the diagnosis of influenza from Nasopharyngeal swab specimens and  should not be used as a sole basis for treatment. Nasal washings and  aspirates are unacceptable for Xpert Xpress SARS-CoV-2/FLU/RSV  testing. Fact Sheet for Patients: PinkCheek.be Fact Sheet for Healthcare Providers: GravelBags.it This test is not yet approved or cleared by the [place] FDA and  has been authorized for detection and/or diagnosis of SARS-CoV-2 by  FDA under an Emergency Use Authorization (EUA). This EUA will remain  in effect (meaning this test can be used) for the duration of the  Covid-19 declaration under Section 564(b)(1) of the Act, 21  U.S.C. section 360bbb-3(b)(1), unless the authorization is  terminated or revoked. Performed at [facility], [address] [place], [place] [address]   MRSA PCR Screening     Status: None   Collection Time: 08/29/19  2:12 AM   Specimen: Nasopharyngeal  Result Value Ref Range Status   MRSA by PCR NEGATIVE NEGATIVE Final    Comment:        The GeneXpert MRSA Assay (FDA approved for NASAL specimens only), is one component of a comprehensive MRSA colonization surveillance program. It is not intended to diagnose MRSA infection nor to guide or monitor treatment for MRSA infections. Performed at [facility], [address] [place], [place] [address]      Chronic tracheostomy Acute on chronic hypoxic respiratory failure Chest imaging negative Continue bronchodilators Robinul added for increased secretions from ETT Bed physiotherapy/chest PT I-S and flutter valve at bedside Tracheostomy care per RT   Diabetes, insulin-dependent Continue lantus Carb modified diet  Hyperlipidemia Home statin  Hypertension Improved control over interval Clonidine patch in place As needed hydralazine   Hypokalemia Replace as needed, currently stabl.e  Anxiety History of cocaine use Counseled patient Resume home Klonopin, Remeron, Seroquel  Time coordinating discharge: Over 30 minutes  SIGNED:   Nolberto Hanlon, MD  [facility] 09/03/2019, 11:49 AM Pager   If 7PM-7AM, please contact night-coverage [email or web address] Password [email or web address]

## 2019-09-03 NOTE — Progress Notes (Signed)
CRITICAL CARE PROGRESS NOTE    Name: Michelle [name] MRN: [id number] DOB: 1960-08-17     LOS: 53   SUBJECTIVE FINDINGS & SIGNIFICANT EVENTS   59 year old female, DNR/DNI, with past medical history notable for severe COPD requiring chronic home vent via chronic tracheostomy admitted with acute on chronic respiratory failure secondary to trachitis vs. recurrent pneumonia   SIGNIFICANT EVENTS/STUDIES:  03/26: Pt initially admitted to the telemetry unit with pneumonia, but due to chronic home vent requirements pt changed to stepdown status and PCCM consulted 03/27 3/27 febrile episode 3/29- patient reports clinical improvement but still has high volume secretions from tracheostomy 3/30- patient with improved respiratory status, decreased rhonchi and clinical improvement 3/31- tracheostomy secretions have improved, optimizing for donwgrade to telemetry 09/03/19- patient optimized medically , discussed with hospitalist for d/c home today.   PAST MEDICAL HISTORY   Past Medical History:  Diagnosis Date  . Allergy   . Anxiety   . Asthma   . CHF (congestive heart failure) ([place])   . Cocaine abuse ([place])   . COPD (chronic obstructive pulmonary disease) ([place])   . Coronary artery disease   . Emphysema/COPD ([place])   . Hepatitis C   . High cholesterol   . Hypertension   . Pneumothorax   . Tobacco abuse      SURGICAL HISTORY   Past Surgical History:  Procedure Laterality Date  . CARDIAC CATHETERIZATION    . CHEST TUBE INSERTION    . IR GASTROSTOMY TUBE MOD SED  10/31/2017  . TRACHEOSTOMY TUBE PLACEMENT N/A 10/17/2017   Procedure: TRACHEOSTOMY;  Surgeon: Carloyn Manner, MD;  Location: [facility] ORS;  Service: ENT;  Laterality: N/A;     FAMILY HISTORY   Family History  Problem Relation Age of Onset  . Lung cancer  Mother   . Asthma Mother   . CVA Father   . CAD Father   . Stroke Father   . Breast cancer Maternal Aunt 67  . COPD Sister      SOCIAL HISTORY   Social History   Tobacco Use  . Smoking status: Former Smoker    Packs/day: 0.10    Years: 41.00    Pack years: 4.10    Types: Cigarettes    Quit date: 06/09/2017    Years since quitting: 2.2  . Smokeless tobacco: Never Used  Substance Use Topics  . Alcohol use: No  . Drug use: Not Currently    Types: "Crack" cocaine    Comment: Past cocaine use.  Quit 04/2017     MEDICATIONS   Current Medication:  Current Facility-Administered Medications:  .  0.9 %  sodium chloride infusion, , Intravenous, Continuous, Sharion Settler, NP, Last Rate: 100 mL/hr at 09/03/19 0030, New Bag at 09/03/19 0030 .  acetaminophen (TYLENOL) tablet 650 mg, 650 mg, Oral, Q6H PRN, Flora Lipps, MD, 650 mg at 09/01/19 1938 .  aspirin chewable tablet 81 mg, 81 mg, Oral, Daily, Sreenath, Sudheer B, MD, 81 mg at 09/02/19 1016 .  budesonide (PULMICORT) nebulizer solution 0.5 mg, 0.5 mg, Nebulization, BID, Blakeney, Dreama Saa, NP .  chlorhexidine (PERIDEX) 0.12 % solution 15 mL, 15 mL, Mouth Rinse, BID, Sharion Settler, NP, 15 mL at 09/02/19 0745 .  Chlorhexidine Gluconate Cloth 2 % PADS 6 each, 6 each, Topical, Daily, Ralene Muskrat B, MD, 6 each at 09/02/19 1610 .  clonazePAM (KLONOPIN) tablet 1.5 mg, 1.5 mg, Oral, TID, Hall, Scott A, RPH, 1.5 mg at 09/02/19 2217 .  cloNIDine (CATAPRES - Dosed  in mg/24 hr) patch 0.1 mg, 0.1 mg, Transdermal, Weekly, Sreenath, Sudheer B, MD, 0.1 mg at 08/30/19 1104 .  dexmedetomidine (PRECEDEX) 400 MCG/100ML (4 mcg/mL) infusion, 0.4-1.2 mcg/kg/hr, Intravenous, Titrated, Darel Hong D, NP, Last Rate: 8.62 mL/hr at 09/03/19 0031, 0.4 mcg/kg/hr at 09/03/19 0031 .  enoxaparin (LOVENOX) injection 40 mg, 40 mg, Subcutaneous, Q24H, Sharion Settler, NP, 40 mg at 09/02/19 2213 .  famotidine (PEPCID) tablet 20 mg, 20 mg, Per Tube, BID,  Awilda Bill, NP, 20 mg at 09/02/19 2218 .  fluticasone (FLONASE) 50 MCG/ACT nasal spray 1 spray, 1 spray, Each Nare, Daily PRN, Ralene Muskrat B, MD, 1 spray at 09/02/19 1652 .  glycopyrrolate (ROBINUL) tablet 1 mg, 1 mg, Oral, TID, Ottie Glazier, MD, 1 mg at 09/02/19 2217 .  guaiFENesin-dextromethorphan (ROBITUSSIN DM) 100-10 MG/5ML syrup 5 mL, 5 mL, Oral, Q6H PRN, Sreenath, Sudheer B, MD .  hydrALAZINE (APRESOLINE) injection 10-20 mg, 10-20 mg, Intravenous, Q4H PRN, Awilda Bill, NP .  insulin aspart (novoLOG) injection 0-15 Units, 0-15 Units, Subcutaneous, TID WC, Sharion Settler, NP, 2 Units at 09/02/19 1602 .  insulin aspart (novoLOG) injection 0-5 Units, 0-5 Units, Subcutaneous, QHS, Sharion Settler, NP, 3 Units at 08/30/19 2151 .  insulin detemir (LEVEMIR) injection 5 Units, 5 Units, Subcutaneous, BID, Ralene Muskrat B, MD, 5 Units at 09/02/19 2221 .  ipratropium-albuterol (DUONEB) 0.5-2.5 (3) MG/3ML nebulizer solution 3 mL, 3 mL, Nebulization, Q4H, Blakeney, Dana G, NP, 3 mL at 09/03/19 0825 .  ipratropium-albuterol (DUONEB) 0.5-2.5 (3) MG/3ML nebulizer solution 3 mL, 3 mL, Nebulization, Q6H PRN, Awilda Bill, NP, 3 mL at 08/31/19 1813 .  MEDLINE mouth rinse, 15 mL, Mouth Rinse, 10 times per day, Sharion Settler, NP, 15 mL at 09/03/19 0518 .  metoprolol tartrate (LOPRESSOR) injection 2.5-5 mg, 2.5-5 mg, Intravenous, Q6H PRN, Awilda Bill, NP .  mirtazapine (REMERON SOL-TAB) disintegrating tablet 30 mg, 30 mg, Oral, QHS, Sharion Settler, NP, 30 mg at 09/02/19 2216 .  morphine 2 MG/ML injection 1-2 mg, 1-2 mg, Intravenous, Q4H PRN, Awilda Bill, NP, 2 mg at 09/02/19 1507 .  oxyCODONE-acetaminophen (PERCOCET/ROXICET) 5-325 MG per tablet 1 tablet, 1 tablet, Oral, Q8H PRN, Awilda Bill, NP, 1 tablet at 09/02/19 1637 .  PARoxetine (PAXIL) tablet 20 mg, 20 mg, Per Tube, Daily, Sreenath, Sudheer B, MD, 20 mg at 09/02/19 1016 .  polyethylene glycol (MIRALAX /  GLYCOLAX) packet 17 g, 17 g, Per Tube, Daily PRN, Sreenath, Sudheer B, MD .  potassium chloride (KLOR-CON) packet 40 mEq, 40 mEq, Oral, Once, Dallie Piles, RPH .  protein supplement (ENSURE MAX) liquid, 11 oz, Oral, BID, Nolberto Hanlon, MD, 11 oz at 09/01/19 2228 .  QUEtiapine (SEROQUEL) tablet 100 mg, 100 mg, Per Tube, QHS, Sharion Settler, NP, 100 mg at 09/02/19 2219 .  senna (SENOKOT) tablet 8.6-17.2 mg, 1-2 tablet, Per Tube, QHS PRN, Sharion Settler, NP    ALLERGIES   Amoxicillin and Ativan [lorazepam]    REVIEW OF SYSTEMS     10 point ROS done and is negative except for respiratory distress  PHYSICAL EXAMINATION   Vital Signs: Temp:  [98.1 F (36.7 C)] 98.1 F (36.7 C) (03/31 2000) Pulse Rate:  [58-93] 60 (04/01 0800) Resp:  [18-21] 20 (04/01 0800) BP: (89-173)/(60-108) 91/61 (04/01 0200) SpO2:  [97 %-100 %] 99 % (04/01 0800) FiO2 (%):  [32 %-36 %] 32 % (04/01 0333)  GENERAL:chronically ill appearing HEAD: Normocephalic, atraumatic.  EYES: Pupils equal, round, reactive to light.  No scleral icterus.  MOUTH: Moist mucosal membrane. NECK: Supple. No thyromegaly. No nodules. No JVD.  PULMONARY: rhonchi bilaterally improved from previous CARDIOVASCULAR: S1 and S2. Regular rate and rhythm. No murmurs, rubs, or gallops.  GASTROINTESTINAL: Soft, nontender, non-distended. No masses. Positive bowel sounds. No hepatosplenomegaly.  MUSCULOSKELETAL: No swelling, clubbing, or edema.  NEUROLOGIC: Mild distress due to acute illness SKIN:intact,warm,dry   PERTINENT DATA     Infusions: . sodium chloride 100 mL/hr at 09/03/19 0030  . dexmedetomidine (PRECEDEX) IV infusion 0.4 mcg/kg/hr (09/03/19 0031)   Scheduled Medications: . aspirin  81 mg Oral Daily  . budesonide (PULMICORT) nebulizer solution  0.5 mg Nebulization BID  . chlorhexidine  15 mL Mouth Rinse BID  . Chlorhexidine Gluconate Cloth  6 each Topical Daily  . clonazePAM  1.5 mg Oral TID  . cloNIDine  0.1 mg  Transdermal Weekly  . enoxaparin (LOVENOX) injection  40 mg Subcutaneous Q24H  . famotidine  20 mg Per Tube BID  . glycopyrrolate  1 mg Oral TID  . insulin aspart  0-15 Units Subcutaneous TID WC  . insulin aspart  0-5 Units Subcutaneous QHS  . insulin detemir  5 Units Subcutaneous BID  . ipratropium-albuterol  3 mL Nebulization Q4H  . mouth rinse  15 mL Mouth Rinse 10 times per day  . mirtazapine  30 mg Oral QHS  . PARoxetine  20 mg Per Tube Daily  . potassium chloride  40 mEq Oral Once  . Ensure Max Protein  11 oz Oral BID  . QUEtiapine  100 mg Per Tube QHS   PRN Medications: acetaminophen, fluticasone, guaiFENesin-dextromethorphan, hydrALAZINE, ipratropium-albuterol, metoprolol tartrate, morphine injection, oxyCODONE-acetaminophen, polyethylene glycol, senna Hemodynamic parameters:   Intake/Output: 03/31 0701 - 04/01 0700 In: 1211.3 [I.V.:1211.3] Out: 2025 [Urine:2025]  Ventilator  Settings: Vent Mode: AC FiO2 (%):  [32 %-36 %] 32 % Set Rate:  [20 bmp] 20 bmp Vt Set:  [450 mL] 450 mL PEEP:  [5 cmH20] 5 cmH20   LAB RESULTS:  Basic Metabolic Panel: Recent Labs  Lab 08/30/19 1442 08/30/19 1442 08/31/19 0611 08/31/19 0611 09/01/19 0420 09/01/19 0420 09/02/19 0421 09/03/19 0433  NA 142  --  143  --  143  --  145 144  K 4.0   < > 4.4   < > 4.6   < > 3.5 3.3*  CL 112*  --  116*  --  111  --  115* 111  CO2 19*  --  22  --  21*  --  23 24  GLUCOSE 200*  --  173*  --  187*  --  123* 114*  BUN 12  --  11  --  15  --  19 19  CREATININE 0.63  --  0.50  --  0.61  --  0.70 0.68  CALCIUM 8.5*  --  8.9  --  8.7*  --  8.5* 8.3*  MG 1.9  --  1.9  --  1.9  --  2.0 1.9  PHOS 1.5*  --  1.5*  --  2.3*  --  2.5 3.0   < > = values in this interval not displayed.   Liver Function Tests: Recent Labs  Lab 08/28/19 1635 08/29/19 0212  AST 15 14*  ALT 15 13  ALKPHOS 101 82  BILITOT 0.8 0.8  PROT 7.8 6.6  ALBUMIN 4.0 3.3*   No results for input(s): LIPASE, AMYLASE in the last  168 hours. No results for input(s): AMMONIA in the last 168  hours. CBC: Recent Labs  Lab 08/30/19 1442 08/31/19 0611 09/01/19 0420 09/02/19 0421 09/03/19 0433  WBC 7.5 7.0 7.9 6.3 4.9  NEUTROABS 6.8 6.0 7.0 4.4 2.9  HGB 11.1* 10.7* 11.3* 10.6* 10.9*  HCT 35.9* 34.7* 36.8 34.7* 35.7*  MCV 84.3 83.6 84.8 84.6 86.0  PLT 194 196 217 227 172   Cardiac Enzymes: No results for input(s): CKTOTAL, CKMB, CKMBINDEX, TROPONINI in the last 168 hours. BNP: Invalid input(s): POCBNP CBG: Recent Labs  Lab 09/02/19 0713 09/02/19 1146 09/02/19 1540 09/02/19 2052 09/03/19 0739  GLUCAP 145* 95 124* 115* 100*     IMAGING RESULTS:   ASSESSMENT AND PLAN    -Multidisciplinary rounds held today  Acute Hypoxic Respiratory Failure -continue Full NIV support -continue Bronchodilator Therapy -[place]an Fio2  -patient with increased secretions from ETT - added rubinol - continue COPD carepath -chest PT - will add bed physiotherapy -IS and Flutter valve at bedside -Tracheostomy care per RN/RT    GI/Nutrition GI PROPHYLAXIS as indicated DIET-->TF's as tolerated Constipation protocol as indicated   ENDO - ICU hypoglycemic\Hyperglycemia protocol -check FSBS per protocol   ELECTROLYTES -follow labs as needed -replace as needed -pharmacy consultation   DVT/GI PRX ordered -SCDs  TRANSFUSIONS AS NEEDED MONITOR FSBS ASSESS the need for LABS as needed   Critical care provider statement:    Critical care time (minutes):  33   Critical care time was exclusive of:  Separately billable procedures and treating other patients   Critical care was necessary to treat or prevent imminent or life-threatening deterioration of the following conditions:  Acute on chronic hypoxemic respiratory failure, advanced COPD, tracheostomy status, multiple comorbid conditions.    Critical care was time spent personally by me on the following activities:  Development of treatment plan with patient or  surrogate, discussions with consultants, evaluation of patient's response to treatment, examination of patient, obtaining history from patient or surrogate, ordering and performing treatments and interventions, ordering and review of laboratory studies and re-evaluation of patient's condition.  I assumed direction of critical care for this patient from another provider in my specialty: no    This document was prepared using Dragon voice recognition software and may include unintentional dictation errors.    Ottie Glazier, M.D.  Division of [place]

## 2019-09-03 NOTE — Progress Notes (Signed)
[place] visited pt. while rounding on ICU as follow up from previous visits; pt. in bed; pt. reports she was readmitted for continued struggle w/pneumonia.  She asked for prayer for herself and her family.  [place] prayed for pt. and offered supportive presence; pt. is aware of [employer] availability if needed.    09/02/19 1530  Clinical Encounter Type  Visited With Patient  Visit Type Follow-up;Spiritual support;Critical Care  Referral From Other (Comment) (Routine Rounding)  Consult/Referral To Chaplain  Spiritual Encounters  Spiritual Needs Emotional;Prayer  Stress Factors  Patient Stress Factors Health changes;Major life changes

## 2019-09-03 NOTE — Consult Note (Signed)
[place] for Electrolyte Monitoring and Replacement   Recent Labs: Potassium (mmol/L)  Date Value  09/03/2019 3.3 (L)  07/11/2014 4.6   Magnesium (mg/dL)  Date Value  09/03/2019 1.9  10/31/2013 2.6 (H)   Calcium (mg/dL)  Date Value  09/03/2019 8.3 (L)   Calcium, Total (mg/dL)  Date Value  07/11/2014 8.9   Albumin (g/dL)  Date Value  08/29/2019 3.3 (L)  07/09/2014 3.7   Phosphorus (mg/dL)  Date Value  09/03/2019 3.0  10/31/2013 4.6   Sodium (mmol/L)  Date Value  09/03/2019 144  07/11/2014 139   Corrected Ca: 9.06 mg/dL  Assessment: She was evaluated with respiratory cultures showing and growing Pseudomonas and S marcescens only resistant to fluouroqinolones. She  has copious secretions from ETT on oral glycopyrolate.  Patient was  admitted with possible tracheitis versus healthcare associated pneumonia now with treatment completed. Patient has significant history of recurrent hospitalizations.   Goal of Therapy:  Electrolytes WNL  Plan:   40 mEq oral KCl x1  Next electrolytes in am  Michelle [name] ,PharmD, BCPS Clinical Pharmacist 09/03/2019 7:22 AM

## 2019-09-03 NOTE — TOC Transition Note (Addendum)
Transition of Care [facility]) - CM/SW Discharge Note   Patient Details  Name: Michelle [name] MRN: [id number] Date of Birth: 16-May-1961  Transition of Care [facility]) CM/SW Contact:  Magnus Ivan, LCSW Phone Number: 09/03/2019, 11:54 AM   Clinical Narrative:   Patient has orders to discharge today. CSW called and left voicemail for [employer]. Spoke with [place] to inform her of discharge and will send Discharge Summary as requested when it is in. Left voicemail for patient's daughter, [name], informing her of discharge. Per MD, patient will need non-emergent EMS transport. CSW completed EMS paperwork and put by chart with DNR. CSW will call for EMS transport when informed by RN that patient is ready to go. No other needs identified at this time.   12:30- CSW attempted to reach patient's daughter again to confirm someone will be home so that transport can be arranged.   Faxed Discharge Summary to [name].  1:35- Call from daughter stating someone is home. RN aware. CSW called and set up non-emergent transport. Informed EMS that patient will have her [facility] with her. Patient is 2nd on the list.        Final next level of care: [place] Barriers to Discharge: Barriers Resolved   Patient Goals and CMS Choice        Discharge Placement                Patient to be transferred to facility by: Non-emergent EMS Name of family member notified: [name]- daughter Patient and family notified of of transfer: 09/03/19  Discharge Plan and [place]: [place] Date [facility] Contacted: 09/03/19   Representative spoke with at [place]: [place] ([place]) Interventions     Readmission Risk Interventions Readmission Risk Prevention Plan 08/31/2019 08/07/2019 09/19/2018  Transportation Screening Complete - Complete  PCP or  Specialist Appt within 5-7 Days - - Complete  Home Care Screening - - Complete  Medication Review (RN CM) - - Complete  Medication Review ([place]) Complete - -  PCP or Specialist appointment within 3-5 days of discharge Complete Complete -  [place] or Home Care Consult Complete Complete -  Palliative Care Screening Complete Complete -  [place] Complete - -  Some recent data might be hidden

## 2019-09-03 NOTE — Progress Notes (Signed)
[place] visited pt. briefly while rounding on ICU; pt. said she had just woken up; shared she hopes to be discharged today.  [facility] is available as needed.    09/03/19 1000  Clinical Encounter Type  Visited With Patient  Visit Type Follow-up;Social support  Referral From Other (Comment) (Routing Rounding)  Spiritual Encounters  Spiritual Needs Emotional

## 2019-09-03 NOTE — Progress Notes (Signed)
Patient awakened ar 0800. Stated she wanted to sleep in. Re awakened at 1100 am. Breakfast re ordered. Refused bath, peri care, trach care etc this am. Allowed [place]  NT to change wet sheets. Upset that her discharge orders were not done. 1225 Given prn for air hunger per patient request. Awaiting daughters arrival to go home. Paperwork done for discharge.

## 2019-09-03 NOTE — Progress Notes (Signed)
[place] home per EMS with paramedics. Discharge teaching done and 1600 Klonopin and Robinal given as scheduled. Discharged paperwork updated.

## 2019-09-07 ENCOUNTER — Telehealth: Payer: Self-pay | Admitting: Primary Care

## 2019-09-07 MED ORDER — CETIRIZINE HCL 10 MG PO TABS
10.00 | ORAL_TABLET | ORAL | Status: DC
Start: 2019-09-08 — End: 2019-09-07

## 2019-09-07 MED ORDER — OXYCODONE HCL 5 MG PO TABS
5.00 | ORAL_TABLET | ORAL | Status: DC
Start: ? — End: 2019-09-07

## 2019-09-07 MED ORDER — FLUTICASONE PROPIONATE 50 MCG/ACT NA SUSP
1.00 | NASAL | Status: DC
Start: 2019-09-08 — End: 2019-09-07

## 2019-09-07 MED ORDER — AZITHROMYCIN 250 MG PO TABS
250.00 | ORAL_TABLET | ORAL | Status: DC
Start: 2019-09-08 — End: 2019-09-07

## 2019-09-07 MED ORDER — MIRTAZAPINE 30 MG PO TABS
30.00 | ORAL_TABLET | ORAL | Status: DC
Start: 2019-09-07 — End: 2019-09-07

## 2019-09-07 MED ORDER — DEXTROSE 50 % IV SOLN
12.50 | INTRAVENOUS | Status: DC
Start: ? — End: 2019-09-07

## 2019-09-07 MED ORDER — SODIUM CHLORIDE 3 % IN NEBU
4.00 | INHALATION_SOLUTION | RESPIRATORY_TRACT | Status: DC
Start: 2019-09-08 — End: 2019-09-07

## 2019-09-07 MED ORDER — ATORVASTATIN CALCIUM 40 MG PO TABS
40.00 | ORAL_TABLET | ORAL | Status: DC
Start: 2019-09-08 — End: 2019-09-07

## 2019-09-07 MED ORDER — GUAIFENESIN 100 MG/5ML PO SYRP
200.00 | ORAL_SOLUTION | ORAL | Status: DC
Start: ? — End: 2019-09-07

## 2019-09-07 MED ORDER — IPRATROPIUM-ALBUTEROL 0.5-2.5 (3) MG/3ML IN SOLN
3.00 | RESPIRATORY_TRACT | Status: DC
Start: 2019-09-07 — End: 2019-09-07

## 2019-09-07 MED ORDER — ASPIRIN 81 MG PO CHEW
81.00 | CHEWABLE_TABLET | ORAL | Status: DC
Start: 2019-09-08 — End: 2019-09-07

## 2019-09-07 MED ORDER — CLONAZEPAM 1 MG PO TABS
1.50 | ORAL_TABLET | ORAL | Status: DC
Start: ? — End: 2019-09-07

## 2019-09-07 MED ORDER — ONDANSETRON 4 MG PO TBDP
4.00 | ORAL_TABLET | ORAL | Status: DC
Start: ? — End: 2019-09-07

## 2019-09-07 MED ORDER — INSULIN LISPRO 100 UNIT/ML ~~LOC~~ SOLN
0.00 | SUBCUTANEOUS | Status: DC
Start: 2019-09-07 — End: 2019-09-07

## 2019-09-07 MED ORDER — ACETAMINOPHEN 325 MG PO TABS
650.00 | ORAL_TABLET | ORAL | Status: DC
Start: 2019-09-07 — End: 2019-09-07

## 2019-09-07 MED ORDER — ENOXAPARIN SODIUM 40 MG/0.4ML ~~LOC~~ SOLN
40.00 | SUBCUTANEOUS | Status: DC
Start: 2019-09-07 — End: 2019-09-07

## 2019-09-07 MED ORDER — BUDESONIDE 0.5 MG/2ML IN SUSP
0.50 | RESPIRATORY_TRACT | Status: DC
Start: 2019-09-07 — End: 2019-09-07

## 2019-09-07 MED ORDER — PREDNISONE 20 MG PO TABS
40.00 | ORAL_TABLET | ORAL | Status: DC
Start: 2019-09-08 — End: 2019-09-07

## 2019-09-07 MED ORDER — FAMOTIDINE 20 MG PO TABS
20.00 | ORAL_TABLET | ORAL | Status: DC
Start: 2019-09-08 — End: 2019-09-07

## 2019-09-07 NOTE — Telephone Encounter (Signed)
Call from [place] RN , re home management of patient. She has not had home LPNs in 6 weeks from [place]. They were to work with her NIV via trach. There was a question of hospice but generally NIV is seen as life extending care and not seen with hospice admission. We discussed having the care givers back, in order to maintain the NIV. I will make a visit after she is home this week and include our SW for goals of care, and services at home to support these goals.

## 2019-09-07 NOTE — Telephone Encounter (Signed)
Talked to daughter [name] after second discussion with D .Olena Heckle RN.  Patient is going home today by personal car. They are pulling foley and feeding tube. [facility] is making referral to [facility] for home health and private duty. I will make home visit tomorrow 4/6 at 830 am for goals of care and care coordination. SW Margaretmary Lombard will go with me to discuss care giving options.

## 2019-09-08 ENCOUNTER — Other Ambulatory Visit: Payer: Self-pay

## 2019-09-08 ENCOUNTER — Other Ambulatory Visit: Payer: Medicaid Other | Admitting: Primary Care

## 2019-09-08 ENCOUNTER — Other Ambulatory Visit: Payer: Medicaid Other

## 2019-09-08 DIAGNOSIS — Z515 Encounter for palliative care: Secondary | ICD-10-CM

## 2019-09-08 NOTE — Progress Notes (Signed)
[facility] Consult Note Telephone: [phone]  Fax: [phone]    PATIENT NAME: Michelle [name] [address] [place] [address] [phone] (home)  DOB: [birth year] MRN: [id number]  PRIMARY CARE PROVIDER:   Frazier Richards, MD, [place] [address] [phone]  REFERRING PROVIDER:  Frazier Richards, [place],  [place] [address] [phone]  RESPONSIBLE PARTY:   Extended Emergency Contact Information Primary Emergency Contact: [place], [place] Phone: [phone] Mobile Phone: [phone] Relation: Daughter Secondary Emergency Contact: Elizabeth Sauer Address: [address] [place], [place] [address] Johnnette Litter of [place] Phone: [phone] Mobile Phone: [phone] Relation: Daughter   ASSESSMENT AND RECOMMENDATIONS:   1. Advance Care Planning/Goals of Care: Goals include to maximize quality of life and symptom management. Discussed MOST, endorses full range of interventions. Will continue to discuss advance care and planning in face of severe disease. We discussed her quality-of-life and goals of care. She stated she wanted to treat any infections but that she was tired of going to the hospital however this fatigue did not change her goals of care at this time. She is happy being in her own home her family is nearby and she would prefer not to have to go to the hospital if she could be treated in place.   2. Symptom Management:   I met with Michelle [name] and her family, SW Margaretmary Lombard and during interview, Surveyor, mining from [facility] also arrived.   Caregiving Issues: Daughter Michelle [name] states they asked about hospice election at the hospital. I clarified that was a decision to support end of life care when patients or families elected for support prior to a natural death. Since she is on the NIV at bedside, this is a complicated discussion. She states she did not  want to return to the hospital but does not want to be on hospice with end of life as a goal. We discussed her usual pattern of running some fever for 3-4 days and then becoming more DOE, ultimately leading to hospital ED trip. In addition, [facility], who is to be supplying RN or LPN for 16 hours a day at bedside has not been able to fulfill due to remote location of patient and no personnel.  There's been some problem with care coordination at home. Apparently [facility] has not been able to send personnel to the home under the Medicaid ventilator program. Last time anyone was at their home was six weeks ago when a nurse stayed one day and then quit. Care deficit seem to be revolving around around this issue. I will follow up with our SW department and speak with DSS re the ventilator program.   I also recommend a more home -based model of care, obtaining a home visiting PCP and utilizing palliative service if available when she seems to be declining for more rapid assessment.  Discussed Medicaid PCS services with SW as well, and will make that request of PCP.   Mobility: Not getting rehab services. Patient agreed to get oob several hours in a chair a day, esp with meals. Her gtube has been d/c'ed. [facility] RN is needed to maintain NIV unit. We discussed LTAC but patient wants to remain at home.  Nutrition: Is taking po now and asked for gtube to be d/c'ed at the hospital, which it was. I encouraged her to sit up 30 min after meals and  eat in high fowlers.   3. Family /Caregiver/Community Supports: [place]ive [place] to [place] with agencies.   4. Cognitive / Functional decline: Alert, oriented, Participated in discussion. 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   5. Follow up Palliative Care Visit: Palliative care will continue to follow for goals of care clarification and symptom management. Return 1-2 weeks or prn.  I spent 80 minutes providing this consultation,  from 08:30 to  9:50. More than 50% of the time in this consultation was spent coordinating communication. I spoke with 3 home health agencies, made referral to home visiting PCP, spoke with hospital CM and talked with 2 staffing agencies to coordinate care.   HISTORY OF PRESENT ILLNESS:  Michelle [name] is a 59 y.o. year old female with multiple medical problems including COPD, NIV dependency, chronic pain, hepatitis C, CHF, CAD.[facility] Palliative Care was asked to follow this patient by consultation request of Adamo, Hattie Perch, MD to help address advance care planning and goals of care. This is a follow up visit.  CODE STATUS: DNR, MOST with DNR, full scope otherwise.  PPS: 30% HOSPICE ELIGIBILITY/DIAGNOSIS: TBD  PAST MEDICAL HISTORY:  Past Medical History:  Diagnosis Date  . Allergy   . Anxiety   . Asthma   . CHF (congestive heart failure) ([facility])   . Cocaine abuse ([facility])   . COPD (chronic obstructive pulmonary disease) ([facility])   . Coronary artery disease   . Emphysema/COPD ([place] [facility])   . Hepatitis C   . High cholesterol   . Hypertension   . Pneumothorax   . Tobacco abuse     SOCIAL HX:  Social History   Tobacco Use  . Smoking status: Former Smoker    Packs/day: 0.10    Years: 41.00    Pack years: 4.10    Types: Cigarettes    Quit date: 06/09/2017    Years since quitting: 2.2  . Smokeless tobacco: Never Used  Substance Use Topics  . Alcohol use: No    ALLERGIES:  Allergies  Allergen Reactions  . Amoxicillin     Patient has tolerated cephalosporins in the past  . Ativan [Lorazepam]     Makes anxiety worse      PERTINENT MEDICATIONS:  Outpatient Encounter Medications as of 09/08/2019  Medication Sig  . aspirin 81 MG chewable tablet Chew 81 mg by mouth daily.   . blood glucose meter kit and supplies KIT Dispense based on patient and insurance preference. Use up to four times daily as directed. (FOR ICD-9 250.00, 250.01).  . budesonide (PULMICORT) 0.25 MG/2ML nebulizer solution Take 2 mLs (0.25 mg  total) by nebulization 2 (two) times daily. (Patient taking differently: Take 0.5 mg by nebulization 2 (two) times daily. )  . cetirizine (ZYRTEC) 10 MG tablet Place 10 mg into feeding tube daily as needed.   . clonazePAM (KLONOPIN) 0.5 MG tablet Take 3 tablets (1.5 mg total) by mouth 3 (three) times daily. (Patient taking differently: Take 1.5 mg by mouth 3 (three) times daily. )  . cloNIDine (CATAPRES - DOSED IN MG/24 HR) 0.1 mg/24hr patch Place 0.1 mg onto the skin once a week.  . Ensure Max Protein (ENSURE MAX PROTEIN) LIQD Take 330 mLs (11 oz total) by mouth 2 (two) times daily.  . famotidine (PEPCID) 20 MG tablet Place 1 tablet (20 mg total) into feeding tube daily.  . ferrous sulfate 325 (65 FE) MG tablet Take 325 mg by mouth every other day.  Marland Kitchen  fluticasone (FLONASE) 50 MCG/ACT nasal spray Place 1 spray into both nostrils daily as needed.   Marland Kitchen glycopyrrolate (ROBINUL) 1 MG tablet Take 1 tablet (1 mg total) by mouth 3 (three) times daily.  Marland Kitchen guaiFENesin (ROBITUSSIN) 100 MG/5ML SOLN Take 5 mLs (100 mg total) by mouth every 4 (four) hours as needed for cough or to loosen phlegm.  Marland Kitchen guaiFENesin-dextromethorphan (ROBITUSSIN DM) 100-10 MG/5ML syrup Take 10 mLs by mouth every 6 (six) hours as needed for cough. (Patient taking differently: Take 5 mLs by mouth every 6 (six) hours as needed for cough. )  . insulin aspart (NOVOLOG) 100 UNIT/ML injection Inject 0-20 Units into the skin 3 (three) times daily with meals.  . insulin detemir (LEVEMIR) 100 UNIT/ML injection Inject 0.25 mLs (25 Units total) into the skin 2 (two) times daily.  . Insulin Pen Needle (PEN NEEDLES) 32G X 6 MM MISC 1 each by Does not apply route 4 (four) times daily - after meals and at bedtime.  Marland Kitchen ipratropium-albuterol (DUONEB) 0.5-2.5 (3) MG/3ML SOLN Take 3 mLs by nebulization every 6 (six) hours. (Patient taking differently: Take 3 mLs by nebulization every 6 (six) hours as needed (for shortness of breath). )  . mirtazapine (REMERON  SOL-TAB) 30 MG disintegrating tablet Take 1 tablet (30 mg total) by mouth at bedtime.  Marland Kitchen oxyCODONE-acetaminophen (PERCOCET/ROXICET) 5-325 MG tablet Take 1 tablet by mouth every 8 (eight) hours as needed.  Marland Kitchen PARoxetine (PAXIL) 20 MG tablet Place 1 tablet (20 mg total) into feeding tube at bedtime. (Patient taking differently: Place 20 mg into feeding tube daily. )  . polyethylene glycol (MIRALAX / GLYCOLAX) 17 g packet Place 17 g into feeding tube daily as needed.   Marland Kitchen QUEtiapine (SEROQUEL) 100 MG tablet Place 1 tablet (100 mg total) into feeding tube at bedtime.  . senna (SENOKOT) 8.6 MG TABS tablet Place 1-2 tablets into feeding tube at bedtime as needed for mild constipation.    No facility-administered encounter medications on file as of 09/08/2019.    PHYSICAL EXAM / ROS:   Current and past weights: 193 lbs per chart General: NAD, frail appearing, obese Cardiovascular: no chest pain reported, no edema,  Pulmonary: no cough, no increased SOB, trach and NIV vent in home. Nebs prn Abdomen: appetite fair, denies constipation, incontinent of bowel GU: denies dysuria, incontinent of urine MSK:  Debility, non ambulatory, oob infrequently. Skin:  g tube removed at hospitalization Neurological: Weakness, chronic pain  Jason Coop, NP [facility]  COVID-19 PATIENT SCREENING TOOL  Person answering questions: ____________[name]_______ _____   1.  Is the patient or any family member in the home showing any signs or symptoms regarding respiratory infection?               Person with Symptom- __________NA_________________  a. Fever                                                                          Yes___ No___          ___________________  b. Shortness of breath  Yes___ No___          ___________________ c. Cough/congestion                                       Yes___  No___         ___________________ d. Body aches/pains                                                          Yes___ No___        ____________________ e. Gastrointestinal symptoms (diarrhea, nausea)           Yes___ No___        ____________________  2. Within the past 14 days, has anyone living in the home had any contact with someone with or under investigation for COVID-19?    Yes___ No_X_   Person __________________

## 2019-09-08 NOTE — Progress Notes (Signed)
COMMUNITY PALLIATIVE CARE SW NOTE  PATIENT NAME: Michelle [name] DOB: 06-27-1960 MRN: [id number]  PRIMARY CARE PROVIDER: Frazier Richards, MD  RESPONSIBLE PARTY:  Acct ID - Guarantor Home Phone Work Phone Relationship Acct Type  [id number] Michelle [name], ROUTT* [phone]  Self P/F     [place], [place], [place] [address]     PLAN OF CARE and INTERVENTIONS:             1. GOALS OF CARE/ ADVANCE CARE PLANNING:  Patient is a DNR. Long discussion of goals. Discussed hospice versus palliative care. Patient wants to continue to seek treatment and live as long as she can. Patient would like to remain at home, as able.  2. SOCIAL/EMOTIONAL/SPIRITUAL ASSESSMENT/ INTERVENTIONS:  SW and NP completed visit with patient and Michelle [name] (patient's daughter) in the home. [place] provided brief overview and reviewed recent hospitalization. Patient has had multiple hospitalizations each month, over the past year. Michelle [name] discussed symptoms that lead to or occur prior to hospitalization and team encouraged Michelle [name] to reach out to NP to assess and discuss when symptoms begin, and to help try to avoid hospitalization. [facility] RN arrived as we were leaving, she is assessing for continued services but unsure of staffing. SW discussed care options, discussed resources, reviewed goals, provided emotional support, and used active and reflective listening. 3. PATIENT/CAREGIVER EDUCATION/ COPING:  Patient is alert, attempts to respond to questions. Patient would smile at SW during conversation. Patient seems withdrawn. Family is supportive as they can be. SW will complete PCS referral for NP to sign. 4. PERSONAL EMERGENCY PLAN:  Family will call 9-1-1 for emergencies. 5. COMMUNITY RESOURCES COORDINATION/ HEALTH CARE NAVIGATION:  Discussed referral to Michelle Chou, NP for primary care and patient is open to this. Team is concerned about patient's care management and support at home. SW to contact [facility] to discuss services  and next steps.  6. FINANCIAL/LEGAL CONCERNS/INTERVENTIONS:  Patient has fixed income. Patient has Medicaid.      SOCIAL HX:  Social History   Tobacco Use  . Smoking status: Former Smoker    Packs/day: 0.10    Years: 41.00    Pack years: 4.10    Types: Cigarettes    Quit date: 06/09/2017    Years since quitting: 2.2  . Smokeless tobacco: Never Used  Substance Use Topics  . Alcohol use: No    CODE STATUS: DNR ADVANCED DIRECTIVES: Y MOST FORM COMPLETE:  Yes. HOSPICE EDUCATION PROVIDED: Yes.  PPS: Patient is dependent of ADLs.  I spent 60 minutes with patient/family, from 8:30-9:30a providing education, support and consultation.   Michelle Lombard, LCSW

## 2019-09-09 ENCOUNTER — Telehealth: Payer: Self-pay | Admitting: Primary Care

## 2019-09-09 ENCOUNTER — Telehealth: Payer: Self-pay

## 2019-09-09 NOTE — Telephone Encounter (Signed)
Call From Cindi at [facility]. [facility] will review ability to staff ventilator services. If family wants to change vendors they will need to make this request by phone to the [facility]  Medicaid site. [phone] is [facility]. They provide respiratory services for Patient.  I was connected with voice mail of Robin Bullins in [place] at [facility] medical supply, subsidiary of [employer]. Message left.  [facility] has sent soft supplies regularly. Also spoke with possible home based PCP at family request to apprise of names of other consulting physicians. Call to [facility] to apprise of this activity. Message left.

## 2019-09-10 ENCOUNTER — Telehealth: Payer: Self-pay | Admitting: Primary Care

## 2019-09-10 ENCOUNTER — Telehealth: Payer: Self-pay | Admitting: Internal Medicine

## 2019-09-10 NOTE — Telephone Encounter (Signed)
SW left VM for SW with Medicaid program at [facility] and SW with long-term care program at [facility].

## 2019-09-10 NOTE — Telephone Encounter (Signed)
I have established with the office of [facility]  that Corbin Ade MD is pulmonologist. She has not seen him in the office since 7/20 and I've asked her daughter to make a f/u appt. He did see her inpatient in March 2021.  [name](POA) is investigating moving closer to [place] for obtaining resources. This may take a while however. She acknowledges and we  have discussed the lack of care options due to remote residence.   [name] also spoke with [facility] PDN as well, and whichever agency can get personnel first, [facility] or [facility], will be her choice of vendor.[name] does know how to do the trach care but states she's nervous when her mother gets infections. Hopefully home visiting PCP will help that, to be initiated in several weeks. Meanwhile I told her to call me for consultation as needed.   I spoke with Dorita Sciara, RT, who is with [facility] and sees patient monthly. He states they are trading out units today for servicing. He monitors vent needs and settings.

## 2019-09-11 ENCOUNTER — Telehealth: Payer: Self-pay | Admitting: Internal Medicine

## 2019-09-11 NOTE — Telephone Encounter (Signed)
Dr. Mortimer Fries please advise:  calling because pt just recently got out the hospital and was wanting to see if she could some prednisone to clear up pt's airway to help her to breathe

## 2019-09-12 NOTE — Telephone Encounter (Signed)
Pred 40 daily for 10 days

## 2019-09-14 ENCOUNTER — Telehealth: Payer: Self-pay | Admitting: Primary Care

## 2019-09-14 ENCOUNTER — Other Ambulatory Visit: Payer: Self-pay

## 2019-09-14 ENCOUNTER — Other Ambulatory Visit: Payer: Medicaid Other | Admitting: Primary Care

## 2019-09-14 DIAGNOSIS — Z515 Encounter for palliative care: Secondary | ICD-10-CM

## 2019-09-14 MED ORDER — PREDNISONE 10 MG PO TABS: 40.0000 mg | ORAL_TABLET | Freq: Every day | ORAL | 0 refills | Status: AC

## 2019-09-14 NOTE — Progress Notes (Signed)
[facility] Consult Note Telephone: [phone]  Fax: [phone]    PATIENT NAME: Michelle [name] [address] [place] [address] [phone] (home)  DOB: Nov 29, 1960 MRN: [id number]  PRIMARY CARE PROVIDER:   Frazier Richards, MD, [place] [address] [phone]  REFERRING PROVIDER:  Frazier Richards, [place],  [place] [address] [phone]  RESPONSIBLE PARTY:   Extended Emergency Contact Information Primary Emergency Contact: [place], [place] Phone: [phone] Mobile Phone: [phone] Relation: Daughter Secondary Emergency Contact: Elizabeth Sauer Address: [address] [place], [place] [address] Johnnette Litter of [place] Phone: [phone] Mobile Phone: [phone] Relation: Daughter   I met with patient and family in the home.  ASSESSMENT AND RECOMMENDATIONS:   1. Advance Care Planning/Goals of Care: Goals include to maximize quality of life and symptom management. DNR, MOST with otherwise full scope. We discussed her desire not to go back to hospital. We discussed she wants to remain at home, not return to hospital, and be treated for symptoms. We discussed how this would look with trilogy machine which she wants to remain on. We will review MOsT on next visit.  2. Symptom Management:   Depression: Has been on paxil 20 mg for years. Recommend increasing to 30 mg daily. Continues to have frequent  panic attacks.  Anxiety: Has had ongoing anxiety attacks.We discussed the clonazepam which has been changed from 4 mg to 1.5 mg after a hospital stay.   Disposition: Went on disability about 2 years ago. Medicare should start soon Looking for a disability housing situation nearer to town so she can get the vent care RNs.   Dyspnea: Has mucous which daughter suctions. Has many bouts with dyspnea. Recommend scheduling guaifenesin ATC.   3. Family  /Caregiver/Community Supports: [place] and [place], [place]. Difficulty staffing home vent RN due to location.  4. Cognitive / Functional decline:  Alert oriented x 2-3. Drowsy. Needs help with all adls and iadls.  5. Follow up Palliative Care Visit: Palliative care will continue to follow for goals of care clarification and symptom management. Return 4-6 weeks or prn.  I spent 60 minutes providing this consultation,  from 1300 to 1400. More than 50% of the time in this consultation was spent coordinating communication.   HISTORY OF PRESENT ILLNESS:  Michelle [name] is a 59 y.o. year old female with multiple medical problems including COPD, NIV dependency, chronic pain, hepatitis C, CHF, CAD. Palliative Care was asked to follow this patient by consultation request of Adamo, Hattie Perch, MD to help address advance care planning and goals of care. This is a follow up visit.  CODE STATUS:  DNR, MOST with otherwise full scope.  PPS: 30% HOSPICE ELIGIBILITY/DIAGNOSIS: TBD  PAST MEDICAL HISTORY:  Past Medical History:  Diagnosis Date  . Allergy   . Anxiety   . Asthma   . CHF (congestive heart failure) ([facility])   . Cocaine abuse ([facility])   . COPD (chronic obstructive pulmonary disease) ([facility])   . Coronary artery disease   . Emphysema/COPD ([facility])   . Hepatitis C   . High cholesterol   . Hypertension   . Pneumothorax   . Tobacco abuse     SOCIAL HX:  Social History   Tobacco Use  . Smoking status: Former Smoker    Packs/day: 0.10    Years: 41.00  Pack years: 4.10    Types: Cigarettes    Quit date: 06/09/2017    Years since quitting: 2.2  . Smokeless tobacco: Never Used  Substance Use Topics  . Alcohol use: No    ALLERGIES:  Allergies  Allergen Reactions  . Amoxicillin     Patient has tolerated cephalosporins in the past  . Ativan [Lorazepam]     Makes anxiety worse      PERTINENT MEDICATIONS:  Outpatient Encounter Medications as of 09/14/2019  Medication Sig   . aspirin 81 MG chewable tablet Chew 81 mg by mouth daily.   . blood glucose meter kit and supplies KIT Dispense based on patient and insurance preference. Use up to four times daily as directed. (FOR ICD-9 250.00, 250.01).  . budesonide (PULMICORT) 0.25 MG/2ML nebulizer solution Take 2 mLs (0.25 mg total) by nebulization 2 (two) times daily. (Patient taking differently: Take 0.5 mg by nebulization 2 (two) times daily. )  . cetirizine (ZYRTEC) 10 MG tablet Place 10 mg into feeding tube daily as needed.   . clonazePAM (KLONOPIN) 0.5 MG tablet Take 3 tablets (1.5 mg total) by mouth 3 (three) times daily. (Patient taking differently: Take 1.5 mg by mouth 3 (three) times daily. )  . cloNIDine (CATAPRES - DOSED IN MG/24 HR) 0.1 mg/24hr patch Place 0.1 mg onto the skin once a week.  . Ensure Max Protein (ENSURE MAX PROTEIN) LIQD Take 330 mLs (11 oz total) by mouth 2 (two) times daily.  . famotidine (PEPCID) 20 MG tablet Place 1 tablet (20 mg total) into feeding tube daily.  . ferrous sulfate 325 (65 FE) MG tablet Take 325 mg by mouth every other day.  . fluticasone (FLONASE) 50 MCG/ACT nasal spray Place 1 spray into both nostrils daily as needed.   Marland Kitchen glycopyrrolate (ROBINUL) 1 MG tablet Take 1 tablet (1 mg total) by mouth 3 (three) times daily.  Marland Kitchen guaiFENesin (ROBITUSSIN) 100 MG/5ML SOLN Take 5 mLs (100 mg total) by mouth every 4 (four) hours as needed for cough or to loosen phlegm.  Marland Kitchen guaiFENesin-dextromethorphan (ROBITUSSIN DM) 100-10 MG/5ML syrup Take 10 mLs by mouth every 6 (six) hours as needed for cough. (Patient taking differently: Take 5 mLs by mouth every 6 (six) hours as needed for cough. )  . insulin aspart (NOVOLOG) 100 UNIT/ML injection Inject 0-20 Units into the skin 3 (three) times daily with meals.  . insulin detemir (LEVEMIR) 100 UNIT/ML injection Inject 0.25 mLs (25 Units total) into the skin 2 (two) times daily.  . Insulin Pen Needle (PEN NEEDLES) 32G X 6 MM MISC 1 each by Does not  apply route 4 (four) times daily - after meals and at bedtime.  Marland Kitchen ipratropium-albuterol (DUONEB) 0.5-2.5 (3) MG/3ML SOLN Take 3 mLs by nebulization every 6 (six) hours. (Patient taking differently: Take 3 mLs by nebulization every 6 (six) hours as needed (for shortness of breath). )  . mirtazapine (REMERON SOL-TAB) 30 MG disintegrating tablet Take 1 tablet (30 mg total) by mouth at bedtime.  Marland Kitchen oxyCODONE-acetaminophen (PERCOCET/ROXICET) 5-325 MG tablet Take 1 tablet by mouth every 8 (eight) hours as needed.  Marland Kitchen PARoxetine (PAXIL) 20 MG tablet Place 1 tablet (20 mg total) into feeding tube at bedtime. (Patient taking differently: Place 20 mg into feeding tube daily. )  . polyethylene glycol (MIRALAX / GLYCOLAX) 17 g packet Place 17 g into feeding tube daily as needed.   Marland Kitchen QUEtiapine (SEROQUEL) 100 MG tablet Place 1 tablet (100 mg total) into feeding tube at  bedtime.  . senna (SENOKOT) 8.6 MG TABS tablet Place 1-2 tablets into feeding tube at bedtime as needed for mild constipation.    No facility-administered encounter medications on file as of 09/14/2019.    PHYSICAL EXAM / ROS:   Current and past weights: stable General: NAD, frail appearing, obese Cardiovascular: no chest pain reported, no edema, S1S2  Pulmonary: no cough,  increased SOB, moves air poorly in R, wheezes in Left Abdomen: appetite fair,denies ndorses constipation, incontinent of bowel GU: denies dysuria, incontinent of urine MSK:  no joint deformities, non ambulatory Skin: no rashes or wounds reported Neurological: Weakness,  Oriented x 2-3, denies [place], NP [facility]  COVID-19 PATIENT SCREENING TOOL  Person answering questions: ___________family_______ _____   1.  Is the patient or any family member in the home showing any signs or symptoms regarding respiratory infection?               Person with Symptom- __________NA_________________  a. Fever                                                                           Yes___ No___          ___________________  b. Shortness of breath                                                    Yes___ No___          ___________________ c. Cough/congestion                                       Yes___  No___         ___________________ d. Body aches/pains                                                         Yes___ No___        ____________________ e. Gastrointestinal symptoms (diarrhea, nausea)           Yes___ No___        ____________________  2. Within the past 14 days, has anyone living in the home had any contact with someone with or under investigation for COVID-19?    Yes___ No_X_   Person __________________

## 2019-09-14 NOTE — Telephone Encounter (Signed)
Called and spoke with daughter and let her know that Prednisone was sent to [facility].  Nothing further needed at this time

## 2019-09-14 NOTE — Telephone Encounter (Signed)
I received a call from daughter Michelle [name] at 48 PM  on 09/11/2019 stating she patient had a fever and difficulty breathing. A private duty nurse in the house stated that her temperature was 102 and  reported sob. I advised them to go to the emergency room for assessment. Michelle [name] came on the phone and said her mother refuses to go to the emergency room. We discussed end of life goals of care again, which we had discussed earlier this week. Her mother, the patient, still does not want to enroll in hospice but she has refused going to ED. I explained that It was her right not to go to the hospital.  Later in the conversation Michelle [name] said her mom was calming down and that her fever was coming down. She said that her fever often spikes when she is  agitated. We discussed her goals of care again. Again she stated she was not going to go to the hospital tonight and that she wanted to be treated as long as she could be before hospice. Michelle [name] asked for empiric treatment. We made a plan for her to try another antibiotic at home along with a prednisone first, with the knowledge that if she did not get better she should go to the hospital for additional assessment. I was also candid that there would come a time that her lungs would not be able to respond to these treatments,  and at that point treating her symptoms, also known as comfort measures through hospice, would likely be an appropriate  choice. Michelle [name] stated no drug allergies.  I called a prescription to [facility] for pick up Sat am for Levaquin 750 mg x five days Po and prednisone 40 mg x five days PO. Michelle [name] will pick it up tomorrow. If patient continues to decline, she will call back and we will continue to discuss appropriate treatment goals. HV scheduled for Monday.

## 2019-09-14 NOTE — Telephone Encounter (Signed)
LMTCB for [employer]

## 2019-09-15 ENCOUNTER — Telehealth: Payer: Self-pay | Admitting: Primary Care

## 2019-09-15 ENCOUNTER — Telehealth: Payer: Self-pay

## 2019-09-15 NOTE — Telephone Encounter (Signed)
Received call back from [facility] stat[facility] PCP in agreement with changing  to home based pcp practice. I will facilitate in any way I can for transitioning to [facility] home visiting service.

## 2019-09-15 NOTE — Telephone Encounter (Signed)
T/c to POA: Discussed some anti anxiety medication options with [name]. It may be best to await input of new PCP.  Tc to [facility] to discuss transition to home PCP as patient is bedbound and cannot travel now. T/c To [facility] to also discuss case with pulmonary RE her vent status.  Messages left with 1. Cherlyn Cushing at [facility] and 2. For Magda Paganini at [facility].

## 2019-09-15 NOTE — Telephone Encounter (Signed)
[facility] returning call and can be reached @ [phone] Joellen Jersey NP) has client @ 1 if you can call before then otherwise will not avail til after 4.Michelle [name]

## 2019-09-15 NOTE — Telephone Encounter (Signed)
SW check-in to follow-up on resources and address any questions. SW attempted to contact [name], left VM with contact information.

## 2019-09-21 ENCOUNTER — Other Ambulatory Visit: Payer: Self-pay

## 2019-09-21 ENCOUNTER — Telehealth: Payer: Self-pay | Admitting: *Deleted

## 2019-09-21 ENCOUNTER — Ambulatory Visit (INDEPENDENT_AMBULATORY_CARE_PROVIDER_SITE_OTHER): Payer: Medicaid Other | Admitting: Primary Care

## 2019-09-21 ENCOUNTER — Telehealth: Payer: Self-pay | Admitting: Primary Care

## 2019-09-21 DIAGNOSIS — J189 Pneumonia, unspecified organism: Secondary | ICD-10-CM

## 2019-09-21 NOTE — Progress Notes (Signed)
Virtual Visit via Telephone Note  I connected with Michelle [name] on 09/21/19 at 10:30 AM EDT by telephone and verified that I am speaking with the correct person using two identifiers.  Location: Patient: Home Provider: Office   I discussed the limitations, risks, security and privacy concerns of performing an evaluation and management service by telephone and the availability of in person appointments. I also discussed with the patient that there may be a patient responsible charge related to this service. The patient expressed understanding and agreed to proceed.   History of Present Illness: 59 year old female, former smoker.  Last medical history significant for severe COPD with chronic tracheostomy, chronic respiratory failure, asthma, atypical pneumonia/Pseudomonas respiratory infection, history of pneumothorax, tobacco abuse, cocaine abuse, CHF, coronary artery disease, hepatitis C, diabetes mellitus type 2.  Patient of Dr. Mortimer Fries, last seen in office December 08, 2018.  Hospital admissions for COPD exacerbation and multi focal pneumonia.  [name] readmitted in March for hospital-acquired pneumonia and sepsis.  Hospital course 08/28/2019-09/03/2019: Admitted with fevers Treated for tracheitis vs healthcare associated pneumonia with aztreonam, Flagyl, vancomycin She was placed on vent for full support and started on bronchodilators.  She had full bed physiotherapy and chest PT.    09/21/2019 Patient attempted to contacted today for follow-up televisit, no answer. LM to reschedule.   Observations/Objective:   Assessment and Plan:   Follow Up Instructions:    I discussed the assessment and treatment plan with the patient. The patient was provided an opportunity to ask questions and all were answered. The patient agreed with the plan and demonstrated an understanding of the instructions.   The patient was advised to call back or seek an in-person evaluation if the symptoms worsen or if the  condition fails to improve as anticipated.  I provided 0 minutes of non-face-to-face time during this encounter.   Martyn Ehrich, NP

## 2019-09-21 NOTE — Telephone Encounter (Signed)
Attempted to contacted today for Hospital follow-up televisit, no answer. LM to re-scheduled.

## 2019-09-21 NOTE — Telephone Encounter (Signed)
LMTCB for [employer]

## 2019-09-22 ENCOUNTER — Other Ambulatory Visit: Payer: Self-pay

## 2019-09-22 ENCOUNTER — Other Ambulatory Visit: Payer: Medicaid Other | Admitting: Primary Care

## 2019-09-22 DIAGNOSIS — Z515 Encounter for palliative care: Secondary | ICD-10-CM

## 2019-09-22 NOTE — Progress Notes (Signed)
[facility] Consult Note Telephone: [phone]  Fax: [phone]    PATIENT NAME: Michelle [name] [address] [place] [address] [phone] (home)  DOB: 1961-02-24 MRN: [id number]  PRIMARY CARE PROVIDER:   Frazier Richards, MD, [place] [address] [phone]  REFERRING PROVIDER:  Frazier Richards, [address] [place],  [place] [address] [phone]  RESPONSIBLE PARTY:   Extended Emergency Contact Information Primary Emergency Contact: [place], [place] Phone: [phone] Mobile Phone: [phone] Relation: Daughter Secondary Emergency Contact: Elizabeth Sauer Address: [address] [place],  [address] [place] of [place] Phone: [phone] Mobile Phone: [phone] Relation: Daughter   I met with patient and family in the home.  ASSESSMENT AND RECOMMENDATIONS:   1. Advance Care Planning/Goals of Care: Goals include to maximize quality of life and symptom management.I met with Mrs. Bilyk and her daughter in their home. She is well-appearing relaxed and smiling. Oxygen saturation is 98% and she is able to speak without becoming breathless. She endorses feeling better and more relaxed with the home care she desires.MOST in home with DNR.  2. Symptom Management: Anxiety: They picked up hydroxyzine last p.m. and she states that it seems to have a good effect and that she's tolerating it well. I  provided education that it would be advisable to try to wean off of the clonazepam and also oxycodone. Her new PCP will be addressing these issues as well.   Housing: [name] is looking into section 8 housing and moving to [place] or [place] for improving obtaining caregivers. She is still working on that process completing forms and I dropped off some apartment names for her to call for section 8 housing.   Dyspnea: Improved, not air hungry,  has pulse ox of 98% and  is taking nebulizers and suctioning as needed. She still has not been able to receive the Medicaid home  ventilator nursing care. [facility] has still not been able to for fill this job. Someone was assigned recently and did not show up.  On day 8 of prednisone taper.  Home/ home ventilator management: New home visiting PCP will see patient next Monday in her home. She will continue with pulmonology per video medicine. Still awaiting Medicaid ventilator program staffing from [facility]. [name] had also spoken with [place] who said they would reach out if they could staff the case but so far they have not.  3. Family /Caregiver/Community Supports: [place]. On Medicaid vent program but agency has not been able to staff care.  4. Cognitive / Functional decline: A and O x 3, bed to chair bound. Needs help with most adls.  5. Follow up Palliative Care Visit: Palliative care will continue to follow for goals of care clarification and symptom management. Return 4-6 weeks or prn.  I spent 40 minutes providing this consultation,  from 1000 to 1040. More than 50% of the time in this consultation was spent coordinating communication.   HISTORY OF PRESENT ILLNESS:  Michelle [name] is a 59 y.o. year old female with multiple medical problems including COPD, DM, Obesity, vent dependent. Palliative Care was asked to follow this patient by consultation request of Adam[place], Hattie Perch, MD to help address advance care planning and goals of care. This is a follow up visit.  CODE STATUS: DNR, Full scope of interventions otherwise  PPS: 30% HOSPICE ELIGIBILITY/DIAGNOSIS:  TBD  PAST MEDICAL HISTORY:  Past Medical History:  Diagnosis Date  . Allergy   . Anxiety   . Asthma   . CHF (congestive heart failure) ([facility])   . Cocaine abuse ([facility])   . COPD (chronic obstructive pulmonary disease) ([facility])   . Coronary artery disease   . Emphysema/COPD ([facility])   . Hepatitis C   . High cholesterol   . Hypertension   .  Pneumothorax   . Tobacco abuse     SOCIAL HX:  Social History   Tobacco Use  . Smoking status: Former Smoker    Packs/day: 0.10    Years: 41.00    Pack years: 4.10    Types: Cigarettes    Quit date: 06/09/2017    Years since quitting: 2.2  . Smokeless tobacco: Never Used  Substance Use Topics  . Alcohol use: No    ALLERGIES:  Allergies  Allergen Reactions  . Amoxicillin     Patient has tolerated cephalosporins in the past  . Ativan [Lorazepam]     Makes anxiety worse      PERTINENT MEDICATIONS:  Outpatient Encounter Medications as of 09/22/2019  Medication Sig  . clonazePAM (KLONOPIN) 0.5 MG tablet Take 3 tablets (1.5 mg total) by mouth 3 (three) times daily. (Patient taking differently: Take 1.5 mg by mouth 3 (three) times daily. )  . cloNIDine (CATAPRES - DOSED IN MG/24 HR) 0.1 mg/24hr patch Place 0.1 mg onto the skin once a week.  . Ensure Max Protein (ENSURE MAX PROTEIN) LIQD Take 330 mLs (11 oz total) by mouth 2 (two) times daily.  . ferrous sulfate 325 (65 FE) MG tablet Take 325 mg by mouth every other day.  . hydrOXYzine (ATARAX/VISTARIL) 25 MG tablet Take 25 mg by mouth every 6 (six) hours as needed for anxiety.  Marland Kitchen ipratropium-albuterol (DUONEB) 0.5-2.5 (3) MG/3ML SOLN Take 3 mLs by nebulization every 6 (six) hours. (Patient taking differently: Take 3 mLs by nebulization every 6 (six) hours as needed (for shortness of breath). )  . mirtazapine (REMERON SOL-TAB) 30 MG disintegrating tablet Take 1 tablet (30 mg total) by mouth at bedtime.  Marland Kitchen oxyCODONE-acetaminophen (PERCOCET/ROXICET) 5-325 MG tablet Take 1 tablet by mouth every 8 (eight) hours as needed.  Marland Kitchen PARoxetine (PAXIL) 20 MG tablet Place 1 tablet (20 mg total) into feeding tube at bedtime. (Patient taking differently: Place 20 mg into feeding tube daily. )  . polyethylene glycol (MIRALAX / GLYCOLAX) 17 g packet Place 17 g into feeding tube daily as needed.   . predniSONE (DELTASONE) 10 MG tablet Take 4 tablets (40 mg  total) by mouth daily with breakfast for 10 days.  Marland Kitchen QUEtiapine (SEROQUEL) 100 MG tablet Place 1 tablet (100 mg total) into feeding tube at bedtime.  . senna (SENOKOT) 8.6 MG TABS tablet Place 1-2 tablets into feeding tube at bedtime as needed for mild constipation.   Marland Kitchen aspirin 81 MG chewable tablet Chew 81 mg by mouth daily.   . blood glucose meter kit and supplies KIT Dispense based on patient and insurance preference. Use up to four times daily as directed. (FOR ICD-9 250.00, 250.01).  . budesonide (PULMICORT) 0.25 MG/2ML nebulizer solution Take 2 mLs (0.25 mg total) by nebulization 2 (two) times daily. (Patient taking differently: Take 0.5 mg by nebulization 2 (two) times daily. )  . cetirizine (ZYRTEC) 10 MG tablet Place 10 mg into feeding tube daily as needed.   . famotidine (PEPCID) 20 MG tablet Place 1 tablet (20 mg total)  into feeding tube daily.  . fluticasone (FLONASE) 50 MCG/ACT nasal spray Place 1 spray into both nostrils daily as needed.   Marland Kitchen glycopyrrolate (ROBINUL) 1 MG tablet Take 1 tablet (1 mg total) by mouth 3 (three) times daily. (Patient not taking: Reported on 09/22/2019)  . guaiFENesin (ROBITUSSIN) 100 MG/5ML SOLN Take 5 mLs (100 mg total) by mouth every 4 (four) hours as needed for cough or to loosen phlegm.  Marland Kitchen guaiFENesin-dextromethorphan (ROBITUSSIN DM) 100-10 MG/5ML syrup Take 10 mLs by mouth every 6 (six) hours as needed for cough. (Patient taking differently: Take 5 mLs by mouth every 6 (six) hours as needed for cough. )  . insulin aspart (NOVOLOG) 100 UNIT/ML injection Inject 0-20 Units into the skin 3 (three) times daily with meals.  . insulin detemir (LEVEMIR) 100 UNIT/ML injection Inject 0.25 mLs (25 Units total) into the skin 2 (two) times daily.  . Insulin Pen Needle (PEN NEEDLES) 32G X 6 MM MISC 1 each by Does not apply route 4 (four) times daily - after meals and at bedtime.   No facility-administered encounter medications on file as of 09/22/2019.    PHYSICAL  EXAM / ROS:   Current and past weights: unavailable General: NAD, frail appearing, obese Cardiovascular: no chest pain reported, no edema,  Pulmonary: no cough, no increased SOB, vent via trach with oxygen Abdomen: appetite good, denies  constipation, continent of bowel GU: denies dysuria, continent of urine MSK:  no joint deformities, non ambulatory, oob to chair with lift Skin: no rashes or wounds reported, nails very long and need cutting. Neurological: Weakness, depression and anxiety reported.   Jason Coop, NP [facility]  COVID-19 PATIENT SCREENING TOOL  Person answering questions: ____________[name]_______ _____   1.  Is the patient or any family member in the home showing any signs or symptoms regarding respiratory infection?               Person with Symptom- __________NA_________________  a. Fever                                                                          Yes___ No___          ___________________  b. Shortness of breath                                                    Yes___ No___          ___________________ c. Cough/congestion                                       Yes___  No___         ___________________ d. Body aches/pains  Yes___ No___        ____________________ e. Gastrointestinal symptoms (diarrhea, nausea)           Yes___ No___        ____________________  2. Within the past 14 days, has anyone living in the home had any contact with someone with or under investigation for COVID-19?    Yes___ No_X_   Person __________________

## 2019-09-22 NOTE — Telephone Encounter (Signed)
Patient has follow up with [facility] on 09/21/19 will close encounter

## 2019-11-05 ENCOUNTER — Other Ambulatory Visit: Payer: Self-pay

## 2019-11-05 ENCOUNTER — Other Ambulatory Visit: Payer: Medicaid Other | Admitting: Primary Care

## 2019-11-05 DIAGNOSIS — Z515 Encounter for palliative care: Secondary | ICD-10-CM

## 2019-11-05 DIAGNOSIS — J441 Chronic obstructive pulmonary disease with (acute) exacerbation: Secondary | ICD-10-CM

## 2019-11-05 NOTE — Progress Notes (Signed)
[facility] Consult Note Telephone: [phone]  Fax: [phone]  PATIENT NAME: Michelle [name] [address] [place] [address] [phone] (home)  DOB: 1961/05/08 MRN: [id number]  PRIMARY CARE PROVIDER:    Alvester Chou, NP [address] [place] [address]  REFERRING PROVIDER:  Alvester Chou, NP  [address] [place] [address] [phone]  RESPONSIBLE PARTY:   Extended Emergency Contact Information Primary Emergency Contact: [place], [place] Phone: [phone] Mobile Phone: [phone] Relation: Daughter Secondary Emergency Contact: Michelle [name] Address: [address] [place], [place] [address] Johnnette Litter of [place] Phone: [phone] Mobile Phone: [phone] Relation: Daughter  I met with patient and family in the home.  ASSESSMENT AND RECOMMENDATIONS:   1. Advance Care Planning/Goals of Care: Goals include to maximize quality of life and symptom management.  Marland Kitchen MOST in home.  2. Symptom Management:   I met with Michelle [name] in her home with her daughter. She appeared comfortable and with brighter affect than recent visits. She is just finishing a course of antibiotics and prednisone for productive cough. She voices great satisfaction of being able to remain at home for treatment/ being treated early for potential infection and avoiding a Hospital stay.   Medications : We reviewed medication today. She had some changes made by her new PCP. She had her queiapine stopped which has gone fairly well. She was initially somewhat agitated but was able to treat that with hydroxyzine. Hydroxyzine is also fairly new but has been working well. She is no longer taking a narcotic.   We reviewed her insulin and her daughter has had some confusion on dosing. She has been giving her NovoLog and not been giving her any detemir /Long acting. She was prescribed to have 25 units of Levemir am and  p.m. and then a sliding scale for meal time. We reviewed her blood sugars which tend to be low fasting in the 100s and then can escalate over the day into the 300s. I stopped the mealtime sliding scale and asked her to begin the Levemir 10 units at bedtime and keep a log. She will see her primary provider in several weeks and I will recommend a review at that time. Her renal function is also reasonable so Metformin might be appropriate. Daughter and patient did not think she had ever taken it. Now that she has stopped her tube feedings her  glucose management may be somewhat easier.   Housing:  We discussed her housing vis--vis her home ventilator program and staffing nurses. They have had several nurses to come out and only one stayed more than a day. Michelle [name] feels their distance is likely impacting ability to obtain care workers. They are in a position to move on the section 8 housing voucher and hopefully find an apartment in town. This should allow easier staffing for her home ventilator program. Hopefully in another month they will have some plans to move closer to town. Michelle [name] also requested to get some more inventory monthly I am from [facility] for the trach supplies. I will request the primary provider to send an order to increase the count of her soft supplies.  3. Family /Caregiver/Community Supports:  [place] 4. Cognitive / Functional decline: A and Ox 3, forgetful at times, oob with hoyer. Vent dependent.  5. Follow up Palliative Care Visit: Palliative care will continue  to follow for goals of care clarification and symptom management. Return 6 weeks or prn.  I spent 60 minutes providing this consultation,  from 1000 to 1100. More than 50% of the time in this consultation was spent coordinating communication.   HISTORY OF PRESENT ILLNESS:  Michelle [name] is a 59 y.o. year old female with multiple medical problems including COPD, trach dependent, NIV dependent,  DM, depression and anxiety. Palliative Care was asked to follow this patient by consultation request of Alvester Chou, NP  to help address advance care planning and goals of care.  This is a follow up  visit.    CODE STATUS: MOST in home  PPS: 30% HOSPICE ELIGIBILITY/DIAGNOSIS: no  PAST MEDICAL HISTORY:  Past Medical History:  Diagnosis Date  . Allergy   . Anxiety   . Asthma   . CHF (congestive heart failure) ([place])   . Cocaine abuse ([place])   . COPD (chronic obstructive pulmonary disease) ([place])   . Coronary artery disease   . Emphysema/COPD ([place])   . Hepatitis C   . High cholesterol   . Hypertension   . Pneumothorax   . Tobacco abuse     SOCIAL HX:  Social History   Tobacco Use  . Smoking status: Former Smoker    Packs/day: 0.10    Years: 41.00    Pack years: 4.10    Types: Cigarettes    Quit date: 06/09/2017    Years since quitting: 2.4  . Smokeless tobacco: Never Used  Substance Use Topics  . Alcohol use: No    ALLERGIES:  Allergies  Allergen Reactions  . Amoxicillin     Patient has tolerated cephalosporins in the past  . Ativan [Lorazepam]     Makes anxiety worse      PERTINENT MEDICATIONS:  Outpatient Encounter Medications as of 11/05/2019  Medication Sig  . aspirin 81 MG chewable tablet Chew 81 mg by mouth daily.   . blood glucose meter kit and supplies KIT Dispense based on patient and insurance preference. Use up to four times daily as directed. (FOR ICD-9 250.00, 250.01).  . budesonide (PULMICORT) 0.25 MG/2ML nebulizer solution Take 2 mLs (0.25 mg total) by nebulization 2 (two) times daily. (Patient taking differently: Take 0.5 mg by nebulization 2 (two) times daily. )  . clonazePAM (KLONOPIN) 0.5 MG tablet Take 3 tablets (1.5 mg total) by mouth 3 (three) times daily. (Patient taking differently: Take 0.5 mg by mouth 3 (three) times daily. )  . cloNIDine (CATAPRES - DOSED IN MG/24 HR) 0.1 mg/24hr patch Place 0.1 mg onto the skin once a week.  . Ensure Max  Protein (ENSURE MAX PROTEIN) LIQD Take 330 mLs (11 oz total) by mouth 2 (two) times daily.  . famotidine (PEPCID) 20 MG tablet Place 1 tablet (20 mg total) into feeding tube daily.  . ferrous sulfate 325 (65 FE) MG tablet Take 325 mg by mouth every other day.  . fluticasone (FLONASE) 50 MCG/ACT nasal spray Place 1 spray into both nostrils daily.   Marland Kitchen gabapentin (NEURONTIN) 100 MG capsule Take 100 mg by mouth 3 (three) times daily. One tablet in am and mid day, and 3 at bedtime.  Marland Kitchen guaiFENesin-dextromethorphan (ROBITUSSIN DM) 100-10 MG/5ML syrup Take 10 mLs by mouth every 6 (six) hours as needed for cough. (Patient taking differently: Take 5 mLs by mouth every 6 (six) hours as needed for cough. )  . hydrOXYzine (ATARAX/VISTARIL) 25 MG tablet Take 25 mg by mouth every 6 (six) hours  as needed for anxiety.  . insulin detemir (LEVEMIR) 100 UNIT/ML FlexPen Inject 10 Units into the skin at bedtime.  . Insulin Pen Needle (PEN NEEDLES) 32G X 6 MM MISC 1 each by Does not apply route 4 (four) times daily - after meals and at bedtime.  Marland Kitchen ipratropium-albuterol (DUONEB) 0.5-2.5 (3) MG/3ML SOLN Take 3 mLs by nebulization every 6 (six) hours. (Patient taking differently: Take 3 mLs by nebulization every 6 (six) hours as needed (for shortness of breath). )  . mirtazapine (REMERON SOL-TAB) 30 MG disintegrating tablet Take 1 tablet (30 mg total) by mouth at bedtime.  Marland Kitchen PARoxetine (PAXIL) 20 MG tablet Place 1 tablet (20 mg total) into feeding tube at bedtime. (Patient taking differently: Place 20 mg into feeding tube daily. )  . polyethylene glycol (MIRALAX / GLYCOLAX) 17 g packet Place 17 g into feeding tube daily as needed.   . senna (SENOKOT) 8.6 MG TABS tablet Place 1-2 tablets into feeding tube at bedtime as needed for mild constipation.   . [DISCONTINUED] insulin aspart (NOVOLOG) 100 UNIT/ML FlexPen Inject 5 Units into the skin 3 (three) times daily with meals. Sliding scale with meals  . [DISCONTINUED] insulin  detemir (LEVEMIR) 100 UNIT/ML injection Inject into the skin daily.  . [DISCONTINUED] cetirizine (ZYRTEC) 10 MG tablet Place 10 mg into feeding tube daily as needed.   . [DISCONTINUED] glycopyrrolate (ROBINUL) 1 MG tablet Take 1 tablet (1 mg total) by mouth 3 (three) times daily. (Patient not taking: Reported on 09/22/2019)  . [DISCONTINUED] guaiFENesin (ROBITUSSIN) 100 MG/5ML SOLN Take 5 mLs (100 mg total) by mouth every 4 (four) hours as needed for cough or to loosen phlegm.  . [DISCONTINUED] insulin aspart (NOVOLOG) 100 UNIT/ML injection Inject 0-20 Units into the skin 3 (three) times daily with meals.  . [DISCONTINUED] insulin detemir (LEVEMIR) 100 UNIT/ML injection Inject 0.25 mLs (25 Units total) into the skin 2 (two) times daily.  . [DISCONTINUED] oxyCODONE-acetaminophen (PERCOCET/ROXICET) 5-325 MG tablet Take 1 tablet by mouth every 8 (eight) hours as needed.  . [DISCONTINUED] QUEtiapine (SEROQUEL) 100 MG tablet Place 1 tablet (100 mg total) into feeding tube at bedtime.   No facility-administered encounter medications on file as of 11/05/2019.     PHYSICAL EXAM / ROS:   Current and past weights: unavailable, requested General: NAD, frail appearing, obese Cardiovascular: no chest pain reported, sl general edema  Pulmonary: + cough, production clear,no increased SOB, oxygen with NIV via trach Abdomen: appetite good,denies  constipation, incontinent of bowel GU: denies dysuria, incontinent of urine MSK:  + joint and ROM abnormalities, non ambulatory, OoB in chair Skin: no rashes or wounds reported, occ yeast infection Neurological: Weakness, anxiety  Jason Coop, NP [facility]  COVID-19 PATIENT SCREENING TOOL  Person answering questions: ____________[name]______ _____   1.  Is the patient or any family member in the home showing any signs or symptoms regarding respiratory infection?               Person with Symptom- __________NA_________________  a. Fever                                                                           Yes___ No___  ___________________  b. Shortness of breath                                                    Yes___ No___          ___________________ c. Cough/congestion                                       Yes___  No___         ___________________ d. Body aches/pains                                                         Yes___ No___        ____________________ e. Gastrointestinal symptoms (diarrhea, nausea)           Yes___ No___        ____________________  2. Within the past 14 days, has anyone living in the home had any contact with someone with or under investigation for COVID-19?    Yes___ No_X_   Person __________________

## 2019-12-08 ENCOUNTER — Telehealth: Payer: Self-pay | Admitting: Primary Care

## 2019-12-08 NOTE — Telephone Encounter (Signed)
Received email that pt needs refills on mirtazapine and insulin needles. Refill sent to [facility].

## 2019-12-10 ENCOUNTER — Other Ambulatory Visit: Payer: Medicaid Other | Admitting: Primary Care

## 2019-12-10 ENCOUNTER — Other Ambulatory Visit: Payer: Self-pay

## 2019-12-10 DIAGNOSIS — J441 Chronic obstructive pulmonary disease with (acute) exacerbation: Secondary | ICD-10-CM

## 2019-12-10 DIAGNOSIS — Z515 Encounter for palliative care: Secondary | ICD-10-CM

## 2019-12-10 NOTE — Progress Notes (Signed)
[facility] Consult Note Telephone: [phone]  Fax: [phone]  PATIENT NAME: Michelle [name] [address] [place] [address] [phone] (home)  DOB: 05-05-1961 MRN: [id number]  PRIMARY CARE PROVIDER:    Alvester Chou, NP,  [address] [place] [address] [phone]  REFERRING PROVIDER:   Alvester Chou, NP [address] [place],  [place] [address] [phone]  RESPONSIBLE PARTY:   Extended Emergency Contact Information Primary Emergency Contact: Michelle [name], Michelle [name] Phone: [phone] Mobile Phone: [phone] Relation: Daughter Secondary Emergency Contact: Michelle [name] Address: [address] [place], [place] [address] Michelle [name] of Michelle [name] Phone: [phone] Mobile Phone: [phone] Relation: Daughter  I met with patient and family in home.  ASSESSMENT AND RECOMMENDATIONS:  1. Advance Care Planning/Goals of Care: Goals include to maximize quality of life and symptom management. I met with [name] and her daughter in their home. We discussed her goals of care continuing to be avoiding hospitalization. She has been very well -managed at home in the past several months with more intensive palliative following and a new primary care provider who comes to her home. MOST in home with DNR, Full scope of treatment, abx, iv and feeding tube use.   2. Symptom Management:   Mobility We discussed her getting up more and possibly being able to take physical therapy once her Medicare benefit initiates. Currently she toilets on a bedpan and states this is not optimal. She voiced wanting to go out more now that she's feeling better and would like to explore a motorized wheelchair. She has a lift carrier for the back of her car but no trailer hitch. She is able to transfer into a wheelchair and then into the car.   Disability services we discussed that over two years ago her disability was granted  and that her Medicare should have automatically at 2 years started. With the public health emergency perhaps this has been delayed. I asked her daughter to contact  disability office for assistance and then the Social Security administration to see where her benefit status stands. Once she has Medicare benefits she can pursue some physical therapy for mobility and strengthening either through home health or outpatient.   Medication We discussed her current regimen ; States no issues. She stated she is still using Clonopin three times a day and also the hydroxyzine. I encouraged her to begin to wean the Clonopin per Michelle Chou DNP's suggestion for transitioning off entirely. I asked her to substitute her midday Clonopin with hydroxyzine. I also recently refilled mirtazapine and insulin needles. I asked Michelle [name] to obtain a copy of her current med list from the drugstore for Michelle [name]. And I asked her to inform the pharmacy that the patient has a new primary provider, Dr. Alvester Chou DNP.   COPD/Dyspnea: Respiratory wise the patient has been doing very well. Subjectively she looks relaxed and more interactive. She does not look ill appearing as she has in the past. NIV is still at bedside and managed by [facility]. No LPN services have been yet obtained from the Medicaid home ventilator program. They continue to seek affordable housing in town to obtain needed services not available in rural settings.   Family /Caregiver/Community Supports:  [place] in [place] location. I also asked her and her daughter if they would be willing to hear about a an initiative  to Investigate palliative services  for those in traditionally underserved areas. They said they would be interested in talking to someone.  4. Cognitive / Functional decline: A and O x 3. Difficulty speaking due to trach. Able to transfer from bed to chair. Can do fine motor adls with set up.  5. Follow up Palliative Care Visit: Palliative care  will continue to follow for goals of care clarification and symptom management. Return 6 weeks or prn.  I spent 60 minutes providing this consultation,  from 0900 to 1000. More than 50% of the time in this consultation was spent coordinating communication.   HISTORY OF PRESENT ILLNESS:  Michelle [name] is a 59 y.o. year old female with multiple medical problems including dyspnea, ventilator and oxygen dependence, immobility, chronic pain and anxiety. Palliative Care was asked to follow this patient by consultation request of Michelle Chou, NP to help address advance care planning and goals of care. This is a follow up visit.  CODE STATUS: DNR  PPS: 40%  HOSPICE ELIGIBILITY/DIAGNOSIS: no/COPD  PAST MEDICAL HISTORY:  Past Medical History:  Diagnosis Date  . Allergy   . Anxiety   . Asthma   . CHF (congestive heart failure) ([place])   . Cocaine abuse ([place])   . COPD (chronic obstructive pulmonary disease) ([place])   . Coronary artery disease   . Emphysema/COPD ([place])   . Hepatitis C   . High cholesterol   . Hypertension   . Pneumothorax   . Tobacco abuse     SOCIAL HX:  Social History   Tobacco Use  . Smoking status: Former Smoker    Packs/day: 0.10    Years: 41.00    Pack years: 4.10    Types: Cigarettes    Quit date: 06/09/2017    Years since quitting: 2.5  . Smokeless tobacco: Never Used  Substance Use Topics  . Alcohol use: No    ALLERGIES:  Allergies  Allergen Reactions  . Amoxicillin     Patient has tolerated cephalosporins in the past  . Ativan [Lorazepam]     Makes anxiety worse      PERTINENT MEDICATIONS:  Outpatient Encounter Medications as of 12/10/2019  Medication Sig  . aspirin 81 MG chewable tablet Chew 81 mg by mouth daily.   . blood glucose meter kit and supplies KIT Dispense based on patient and insurance preference. Use up to four times daily as directed. (FOR ICD-9 250.00, 250.01).  . budesonide (PULMICORT) 0.25 MG/2ML nebulizer solution Take 2 mLs (0.25 mg  total) by nebulization 2 (two) times daily. (Patient taking differently: Take 0.5 mg by nebulization 2 (two) times daily. )  . clonazePAM (KLONOPIN) 0.5 MG tablet Take 3 tablets (1.5 mg total) by mouth 3 (three) times daily. (Patient taking differently: Take 0.5 mg by mouth 3 (three) times daily. )  . cloNIDine (CATAPRES - DOSED IN MG/24 HR) 0.1 mg/24hr patch Place 0.1 mg onto the skin once a week.  . Ensure Max Protein (ENSURE MAX PROTEIN) LIQD Take 330 mLs (11 oz total) by mouth 2 (two) times daily.  . famotidine (PEPCID) 20 MG tablet Place 1 tablet (20 mg total) into feeding tube daily.  . ferrous sulfate 325 (65 FE) MG tablet Take 325 mg by mouth every other day.  . fluticasone (FLONASE) 50 MCG/ACT nasal spray Place 1 spray into both nostrils daily.   Marland Kitchen gabapentin (NEURONTIN) 100 MG capsule Take 100 mg by mouth 3 (three) times daily. One tablet in am and mid day, and 3 at bedtime.  Marland Kitchen  guaiFENesin-dextromethorphan (ROBITUSSIN DM) 100-10 MG/5ML syrup Take 10 mLs by mouth every 6 (six) hours as needed for cough. (Patient taking differently: Take 5 mLs by mouth every 6 (six) hours as needed for cough. )  . hydrOXYzine (ATARAX/VISTARIL) 25 MG tablet Take 25 mg by mouth every 6 (six) hours as needed for anxiety.  . insulin detemir (LEVEMIR) 100 UNIT/ML FlexPen Inject 10 Units into the skin at bedtime.  . Insulin Pen Needle (PEN NEEDLES) 32G X 6 MM MISC 1 each by Does not apply route 4 (four) times daily - after meals and at bedtime.  Marland Kitchen ipratropium-albuterol (DUONEB) 0.5-2.5 (3) MG/3ML SOLN Take 3 mLs by nebulization every 6 (six) hours. (Patient taking differently: Take 3 mLs by nebulization every 6 (six) hours as needed (for shortness of breath). )  . mirtazapine (REMERON SOL-TAB) 30 MG disintegrating tablet Take 1 tablet (30 mg total) by mouth at bedtime.  Marland Kitchen PARoxetine (PAXIL) 20 MG tablet Place 1 tablet (20 mg total) into feeding tube at bedtime. (Patient taking differently: Place 20 mg into feeding  tube daily. )  . polyethylene glycol (MIRALAX / GLYCOLAX) 17 g packet Place 17 g into feeding tube daily as needed.   . senna (SENOKOT) 8.6 MG TABS tablet Place 1-2 tablets into feeding tube at bedtime as needed for mild constipation.    No facility-administered encounter medications on file as of 12/10/2019.    PHYSICAL EXAM / ROS:   Current and past weights: subjectively stable, not available General: NAD, frail appearing, obese Cardiovascular: no chest pain reported, no edema  Pulmonary:  LCTA all fields, no cough, no increased SOB, oxygen and NIV via trach. Abdomen: appetite good, denies constipation, continent of bowel GU: denies dysuria, incontinent of urine at times MSK:  + joint and ROM abnormalities, non  Ambulatory. Pivot transfers Skin: no rashes or wounds reported Neurological: Weakness, chronic pain, chronic anxiety and depression.  Jason Coop, NP [facility]  COVID-19 PATIENT SCREENING TOOL  Person answering questions: ___________[name]_______ _____   1.  Is the patient or any family member in the home showing any signs or symptoms regarding respiratory infection?               Person with Symptom- __________NA_________________  a. Fever                                                                          Yes___ No___          ___________________  b. Shortness of breath                                                    Yes___ No___          ___________________ c. Cough/congestion                                       Yes___  No___         ___________________ d. Body aches/pains  Yes___ No___        ____________________ e. Gastrointestinal symptoms (diarrhea, nausea)           Yes___ No___        ____________________  2. Within the past 14 days, has anyone living in the home had any contact with someone with or under investigation for COVID-19?    Yes___ No_X_   Person __________________

## 2019-12-31 IMAGING — DX PORTABLE CHEST - 1 VIEW
1 series · 1 of 1 positions shown · non-contrast
Comparison: 12/30/2017 chest radiograph

CLINICAL DATA: 58 y/o  F; shortness of breath.

EXAM:
PORTABLE CHEST 1 VIEW

[chest ap]
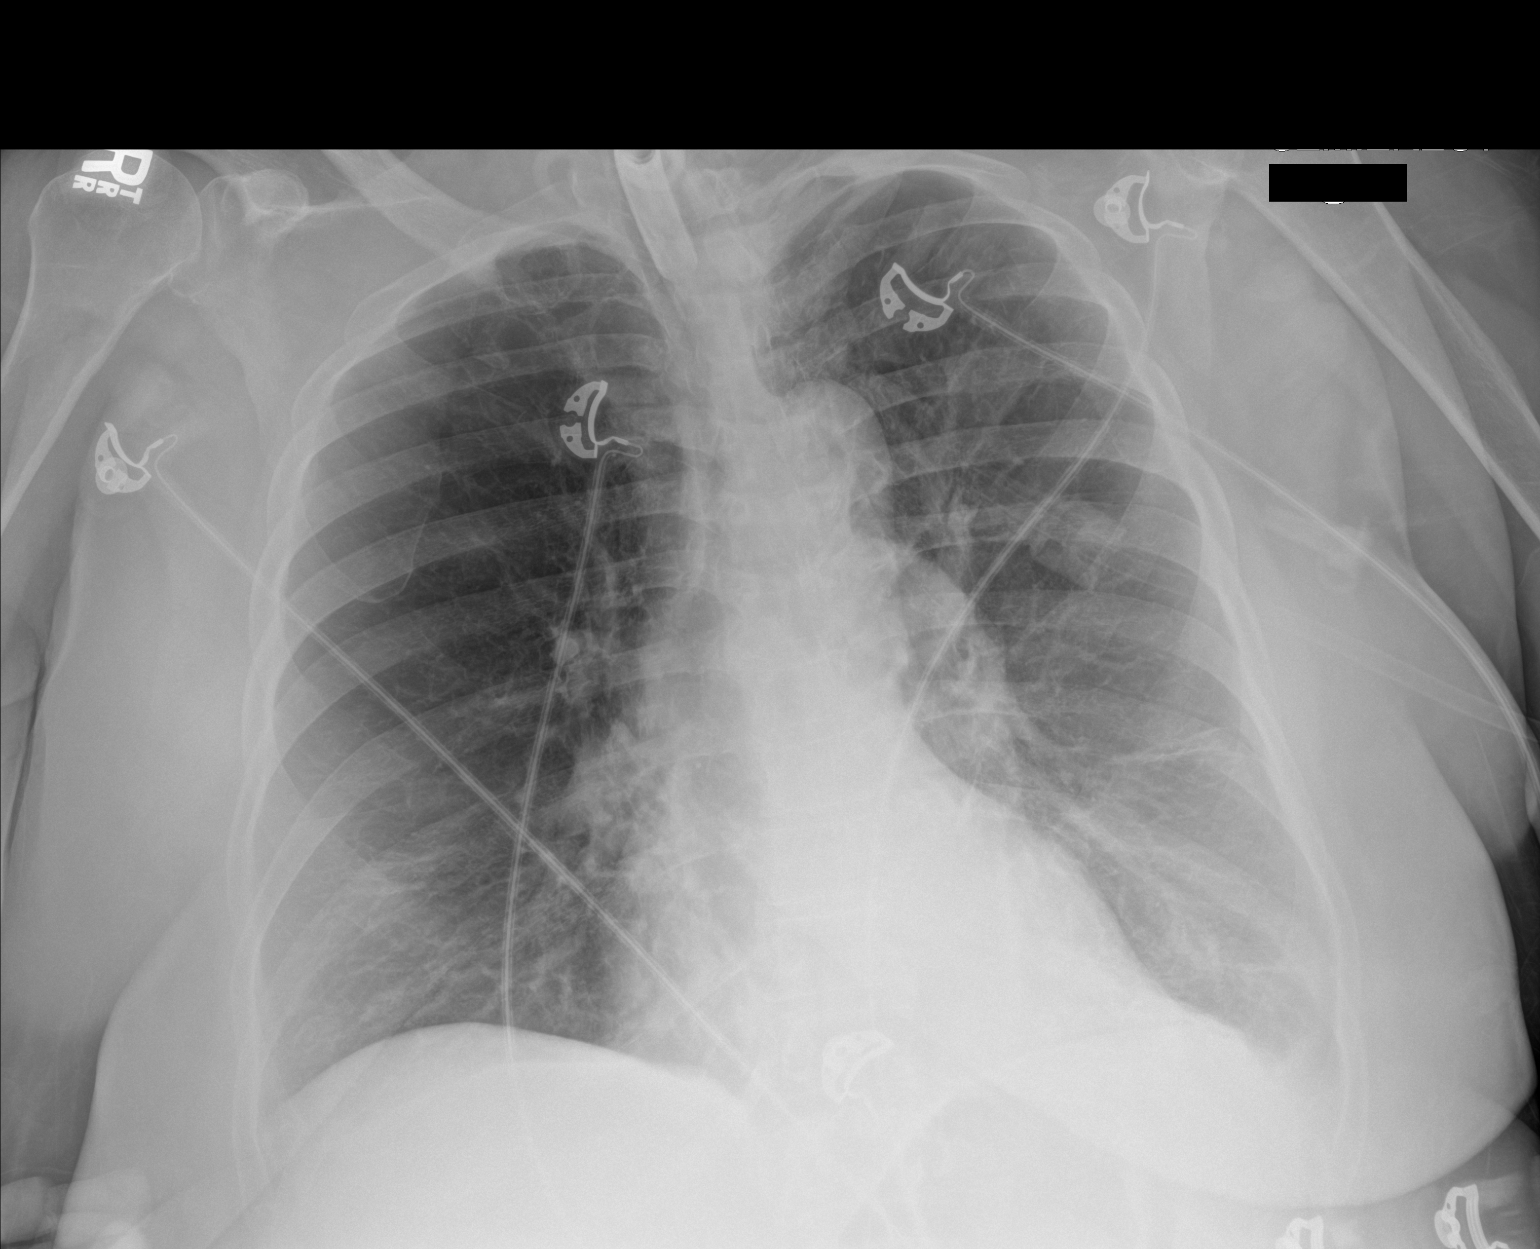

[1 of 1 positions shown; findings below may reference images not displayed]

FINDINGS: Stable position of tracheostomy tube with tip projecting over the
tracheal air column. Hyperinflated lungs and chronic bronchitic
changes. Stable cardiac silhouette within normal limits given
projection and technique. Left basilar streaky opacities. No pleural
effusion or pneumothorax. No acute osseous abnormality is evident.
IMPRESSION: Stable findings of COPD. Left basilar streaky opacities may
represent atelectasis or developing infiltrate.

## 2019-12-31 IMAGING — DX PORTABLE CHEST - 1 VIEW
1 series · 1 of 1 positions shown · non-contrast
Comparison: One-view chest x-ray [DATE] a.m.

CLINICAL DATA: Progressive shortness of breath.

EXAM:
PORTABLE CHEST 1 VIEW

[chest ap]
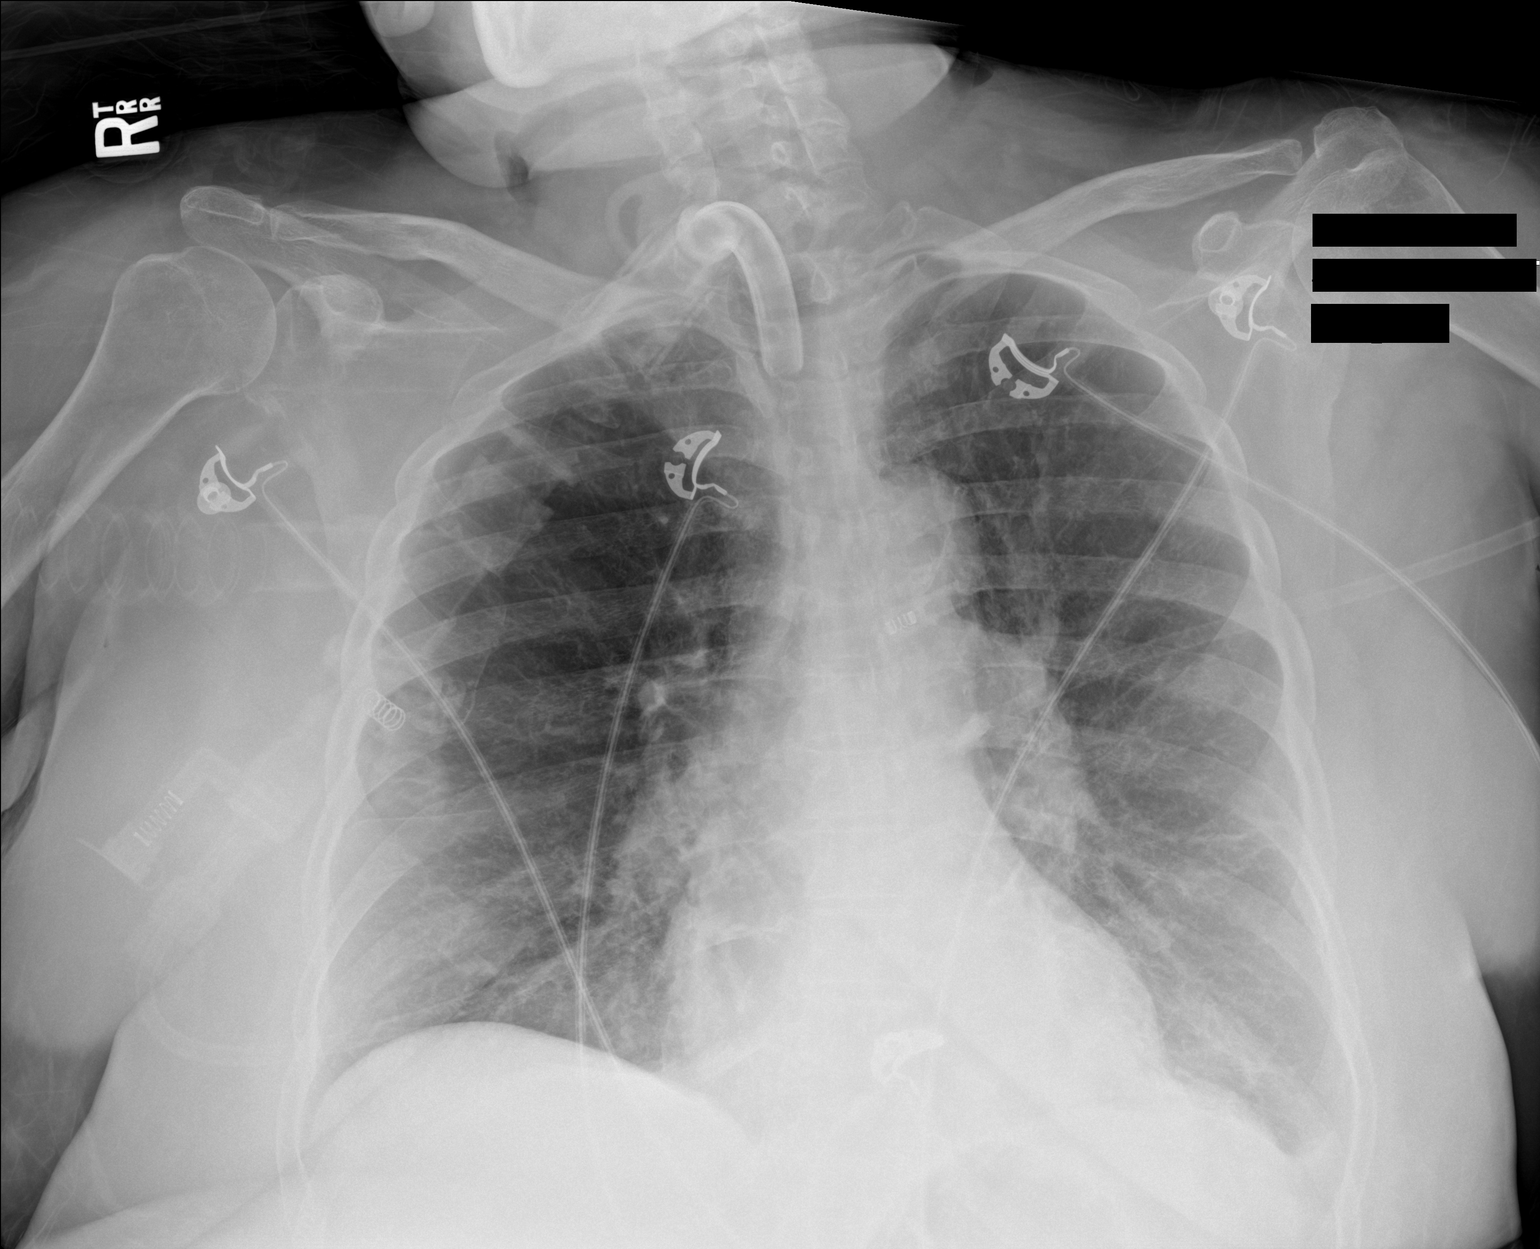

[1 of 1 positions shown; findings below may reference images not displayed]

FINDINGS: The heart size is normal. Prominence the pulmonary arteries is again
noted. Tracheostomy tube is stable. Left lower lobe airspace disease
is similar the prior exam. Changes of COPD are again noted.
IMPRESSION: 1. Stable left lower lobe airspace disease concerning for infection.

## 2020-01-01 IMAGING — DX PORTABLE CHEST - 1 VIEW
1 series · 1 of 1 positions shown · non-contrast
Comparison: One-view chest x-ray 09/17/2018

CLINICAL DATA: Respiratory failure.

EXAM:
PORTABLE CHEST 1 VIEW

[chest ap]
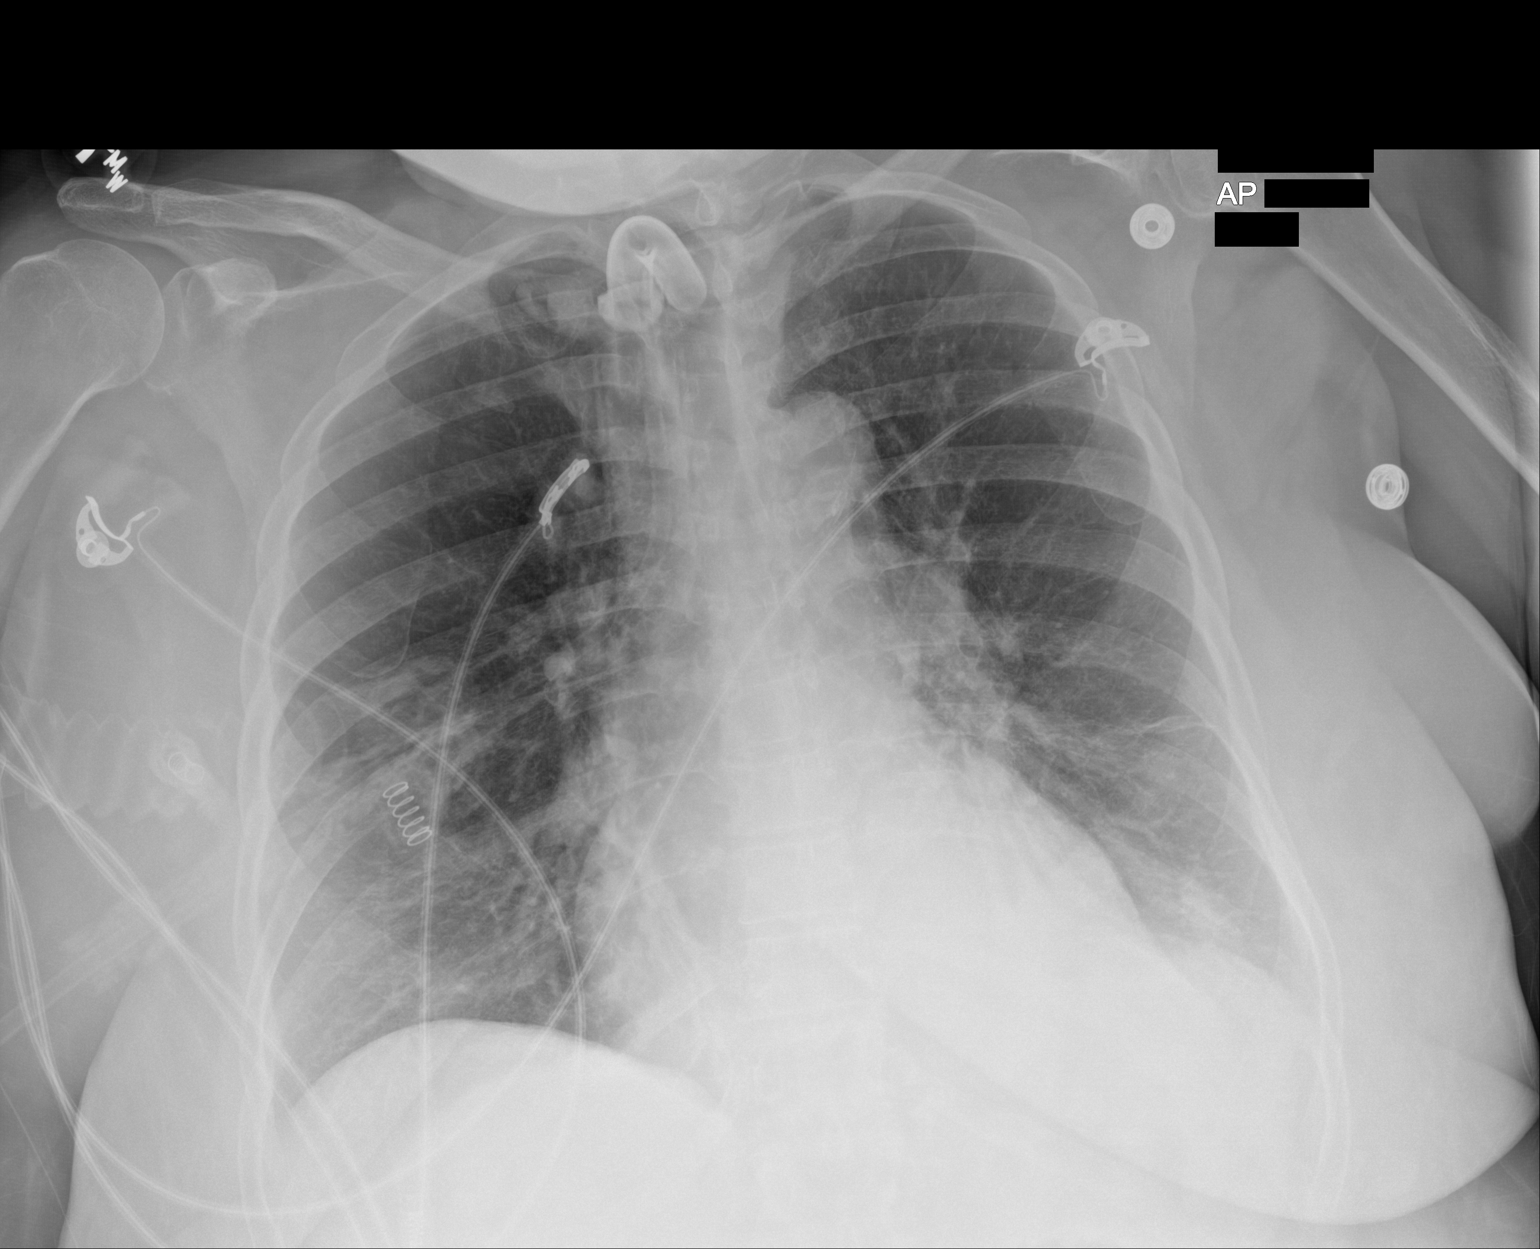

[1 of 1 positions shown; findings below may reference images not displayed]

FINDINGS: Heart size is normal. Tracheostomy tube is stable. Progressive left
lower lobe consolidation is present. The right lung is clear. The
visualized soft tissues and bony thorax are unremarkable.
IMPRESSION: 1. Progressive left lower lobe airspace consolidation concerning for
pneumonia or aspiration.

## 2020-01-19 ENCOUNTER — Other Ambulatory Visit: Payer: Medicaid Other | Admitting: Primary Care

## 2020-01-19 ENCOUNTER — Other Ambulatory Visit: Payer: Self-pay

## 2020-01-19 DIAGNOSIS — J441 Chronic obstructive pulmonary disease with (acute) exacerbation: Secondary | ICD-10-CM

## 2020-01-19 DIAGNOSIS — I5032 Chronic diastolic (congestive) heart failure: Secondary | ICD-10-CM

## 2020-01-19 DIAGNOSIS — F418 Other specified anxiety disorders: Secondary | ICD-10-CM

## 2020-01-19 DIAGNOSIS — Z515 Encounter for palliative care: Secondary | ICD-10-CM

## 2020-01-19 NOTE — Progress Notes (Signed)
[facility] Consult Note Telephone: [phone]  Fax: [phone]  PATIENT NAME: Michelle [name] [address] [place] [address] [phone] (home)  DOB: 12/21/1960 MRN: [id number]  PRIMARY CARE PROVIDER:    Alvester Chou, NP,  [address] [place] [address] [phone]  REFERRING PROVIDER:   Alvester Chou, NP [address] [place],  [place] [address] [phone]  RESPONSIBLE PARTY:   Extended Emergency Contact Information Primary Emergency Contact: Kiana, [place] Phone: [phone] Mobile Phone: [phone] Relation: Daughter Secondary Emergency Contact: Elizabeth Sauer Address: [address] [place], [place] [address] Johnnette Litter of [place] Phone: [phone] Mobile Phone: [phone] Relation: Daughter  I've visited Michelle [name] today in her home. They had forgotten I was coming and her usual care worker, her daughter, was asleep from working the night shift.   ASSESSMENT AND RECOMMENDATIONS:   1. Advance Care Planning/Goals of Care: Goals include to maximize quality of life and symptom management. MOST on file with DNR but full scope of interventions. She has had a feeding tube in the past.  2. Symptom Management:    Mrs. Fultz let me know that she's feeling good, pain is fairly well-controlled. She stated that her anxiety is treated well with Clonopin and additional hydroxyzine as needed. She endorsed a good appetite and no problems with intake, aspiration etc.   Covid education provided about vaccination. She states her desire to obtain the Covid vaccine. I put  her name on the [facility] list again to have a home visitor come and administer. My office staff has reached out to the PHD to identify her.  There are several adults and an 28 year old grandson in the household.   I was able to reach daughter at a later time to continue our interview. Patient is  doing well with home visiting primary provider and has been maintained at home without hospital stay for a record amount of time. She is very pleased and states that she it's very happy with her current I medical management regimen. I will continue to visit every 6 to 8 weeks or as needed for palliative management.  3. Follow up Palliative Care Visit: Palliative care will continue to follow for goals of care clarification and symptom management. Return 6-8 weeks or prn.  4. Family /Caregiver/Community Supports:  [place] for care giving. No home vent LPN obtained  5. Cognitive / Functional decline:  A and O x 3. Largely bedbound but goes out on occasion. Needs assist with many adls, all iadls.   I spent 40 minutes providing this consultation,  from 0900 to 0940. More than 50% of the time in this consultation was spent coordinating communication.   CHIEF COMPLAINT: chronic disease management  HISTORY OF PRESENT ILLNESS:  Michelle [name] is a 59 y.o. year old female with multiple medical problems including COPD, trach with vent, anxiety and depression, chronic pain. Palliative Care was asked to follow this patient by consultation request of Alvester Chou, NP to help address advance care planning and goals of care. This is a follow up visit.  CODE STATUS: DNR  PPS: 50%  HOSPICE ELIGIBILITY/DIAGNOSIS: no PAST MEDICAL HISTORY:  Past Medical History:  Diagnosis Date  . Allergy   . Anxiety   . Asthma   . CHF (congestive heart failure) ([facility])   . Cocaine abuse ([facility])   . COPD (  chronic obstructive pulmonary disease) ([facility])   . Coronary artery disease   . Emphysema/COPD ([facility])   . Hepatitis C   . High cholesterol   . Hypertension   . Pneumothorax   . Tobacco abuse     SOCIAL HX:  Social History   Tobacco Use  . Smoking status: Former Smoker    Packs/day: 0.10    Years: 41.00    Pack years: 4.10    Types: Cigarettes    Quit date: 06/09/2017    Years  since quitting: 2.6  . Smokeless tobacco: Never Used  Substance Use Topics  . Alcohol use: No   FAMILY HX:  Family History  Problem Relation Age of Onset  . Lung cancer Mother   . Asthma Mother   . CVA Father   . CAD Father   . Stroke Father   . Breast cancer Maternal Aunt 67  . COPD Sister     ALLERGIES:  Allergies  Allergen Reactions  . Amoxicillin     Patient has tolerated cephalosporins in the past  . Ativan [Lorazepam]     Makes anxiety worse      PERTINENT MEDICATIONS:  Outpatient Encounter Medications as of 01/19/2020  Medication Sig  . aspirin 81 MG chewable tablet Chew 81 mg by mouth daily.   . blood glucose meter kit and supplies KIT Dispense based on patient and insurance preference. Use up to four times daily as directed. (FOR ICD-9 250.00, 250.01).  . budesonide (PULMICORT) 0.25 MG/2ML nebulizer solution Take 2 mLs (0.25 mg total) by nebulization 2 (two) times daily. (Patient taking differently: Take 0.5 mg by nebulization 2 (two) times daily. )  . clonazePAM (KLONOPIN) 0.5 MG tablet Take 3 tablets (1.5 mg total) by mouth 3 (three) times daily. (Patient taking differently: Take 0.5 mg by mouth 3 (three) times daily. )  . cloNIDine (CATAPRES - DOSED IN MG/24 HR) 0.1 mg/24hr patch Place 0.1 mg onto the skin once a week.  . Ensure Max Protein (ENSURE MAX PROTEIN) LIQD Take 330 mLs (11 oz total) by mouth 2 (two) times daily.  . famotidine (PEPCID) 20 MG tablet Place 1 tablet (20 mg total) into feeding tube daily.  . ferrous sulfate 325 (65 FE) MG tablet Take 325 mg by mouth every other day.  . fluticasone (FLONASE) 50 MCG/ACT nasal spray Place 1 spray into both nostrils daily.   Marland Kitchen gabapentin (NEURONTIN) 100 MG capsule Take 100 mg by mouth 3 (three) times daily. One tablet in am and mid day, and 3 at bedtime.  Marland Kitchen guaiFENesin-dextromethorphan (ROBITUSSIN DM) 100-10 MG/5ML syrup Take 10 mLs by mouth every 6 (six) hours as needed for cough. (Patient taking differently: Take 5  mLs by mouth every 6 (six) hours as needed for cough. )  . hydrOXYzine (ATARAX/VISTARIL) 25 MG tablet Take 25 mg by mouth every 6 (six) hours as needed for anxiety.  . insulin detemir (LEVEMIR) 100 UNIT/ML FlexPen Inject 10 Units into the skin at bedtime.  . Insulin Pen Needle (PEN NEEDLES) 32G X 6 MM MISC 1 each by Does not apply route 4 (four) times daily - after meals and at bedtime.  Marland Kitchen ipratropium-albuterol (DUONEB) 0.5-2.5 (3) MG/3ML SOLN Take 3 mLs by nebulization every 6 (six) hours. (Patient taking differently: Take 3 mLs by nebulization every 6 (six) hours as needed (for shortness of breath). )  . mirtazapine (REMERON SOL-TAB) 30 MG disintegrating tablet Take 1 tablet (30 mg total) by mouth at bedtime.  Marland Kitchen PARoxetine (PAXIL) 20  MG tablet Place 1 tablet (20 mg total) into feeding tube at bedtime. (Patient taking differently: Place 20 mg into feeding tube daily. )  . polyethylene glycol (MIRALAX / GLYCOLAX) 17 g packet Place 17 g into feeding tube daily as needed.   . senna (SENOKOT) 8.6 MG TABS tablet Place 1-2 tablets into feeding tube at bedtime as needed for mild constipation.    No facility-administered encounter medications on file as of 01/19/2020.    PHYSICAL EXAM / ROS:  150/100, HR68. RR 22. Meds for this AM not yet taken, instructed to Current and past weights: unavailable General: NAD, frail appearing, obese Cardiovascular:S1S2,  no chest pain reported, no  LE edema  Pulmonary: Moving air tightly, needs neb, no cough, no increased SOB,oxygen via trilogy NIV via trach Abdomen: appetite good, denies constipation, continent of bowel GU: denies dysuria, incontinent of urine at times MSK: ++ joint and ROM abnormalities, non ambulatory Skin: no rashes or wounds reported Neurological: Weakness,  Chronic pain, denies insomnia  Jason Coop, NP , DNP, MPH, [facility]  COVID-19 PATIENT SCREENING TOOL  Person answering questions: ___________Self______ _____   1.  Is the  patient or any family member in the home showing any signs or symptoms regarding respiratory infection?               Person with Symptom- _________self_________________  a. Fever                                                                          Yes___ No___          ___________________  b. Shortness of breath                                                    Yes___ No___          ___________________ c. Cough/congestion                                       Yes___  No___         ___________________ d. Body aches/pains                                                         Yes___ No___        ____________________ e. Gastrointestinal symptoms (diarrhea, nausea)           Yes___ No___        ____________________  2. Within the past 14 days, has anyone living in the home had any contact with someone with or under investigation for COVID-19?    Yes___ No_X_   Person __________________

## 2020-02-01 IMAGING — DX PORTABLE CHEST - 1 VIEW
1 series · 1 of 1 positions shown · non-contrast
Comparison: 09/18/2018 chest radiograph

CLINICAL DATA: 58 y/o F; increasing shortness of breath. Under
treatment for pneumonia.

EXAM:
PORTABLE CHEST 1 VIEW

[chest ap]
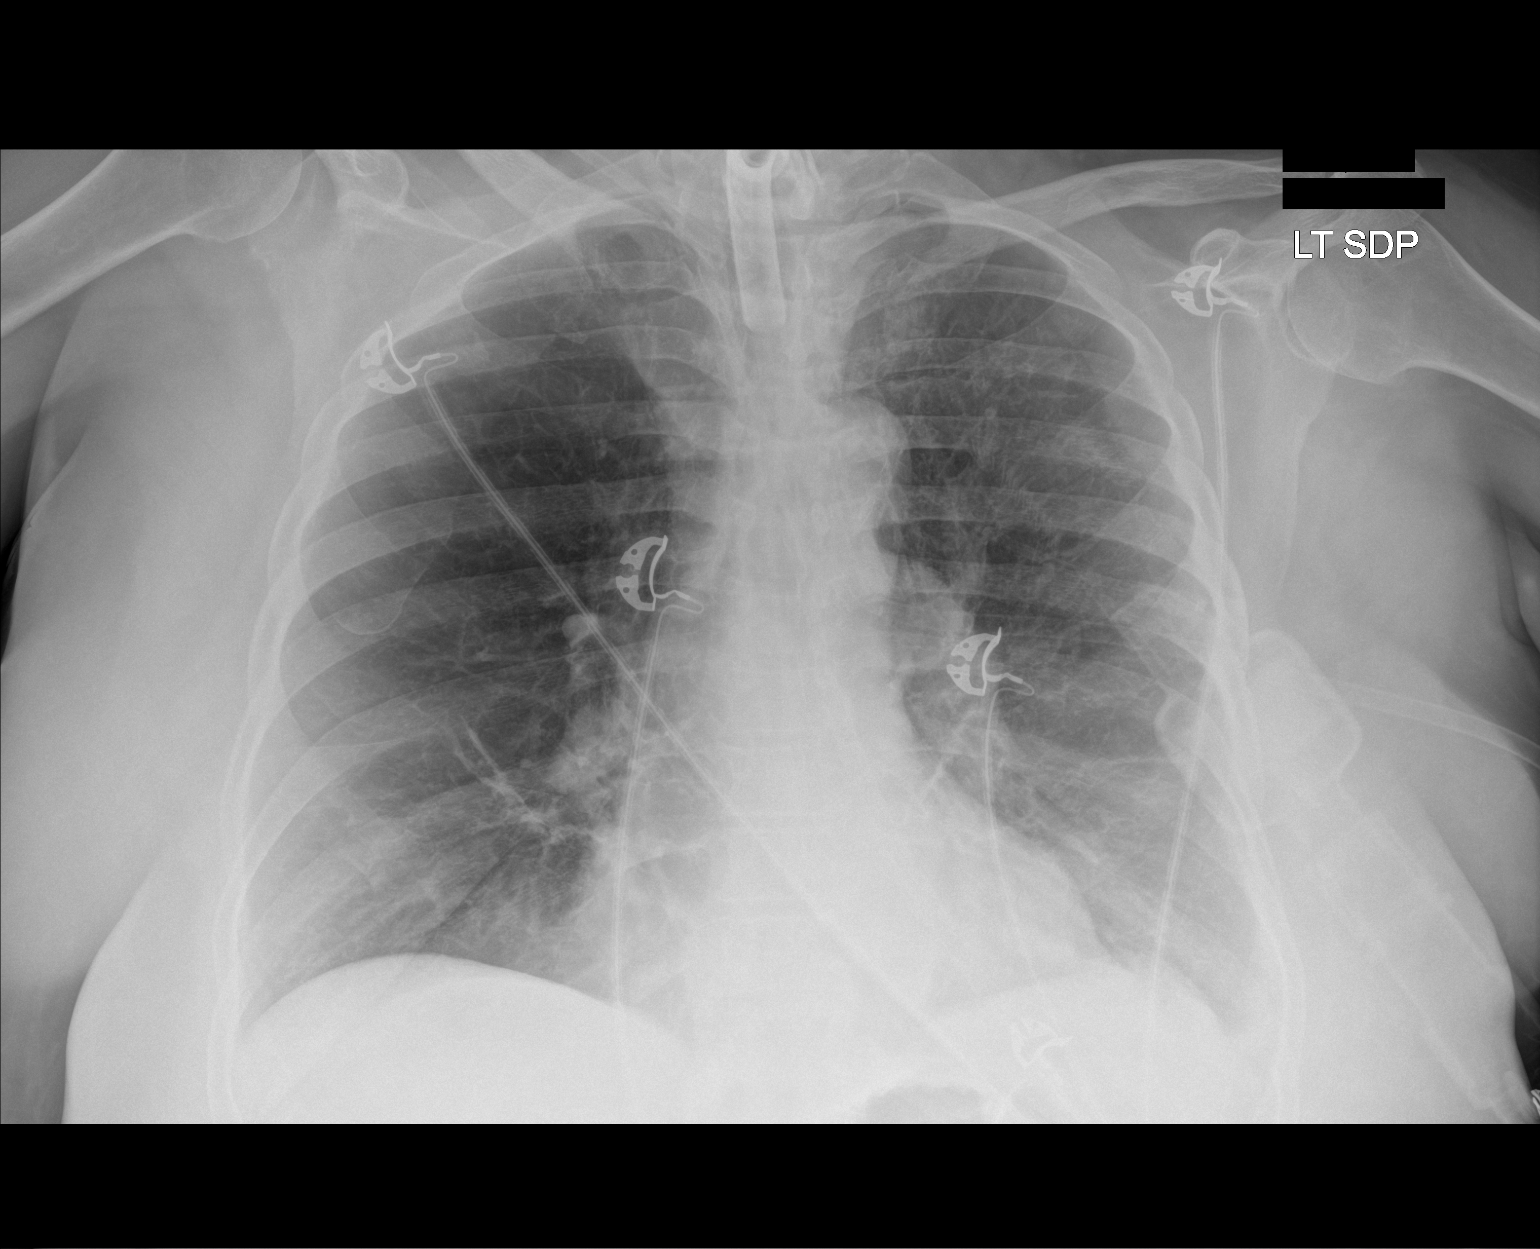

[1 of 1 positions shown; findings below may reference images not displayed]

FINDINGS: Stable normal cardiac silhouette. Improved aeration at the left lung
base. No new focal consolidation. No pleural effusion or
pneumothorax. Tracheostomy tube projects over the tracheal air
column. Bones are unremarkable.
IMPRESSION: Improved aeration at the left lung base. No new focal consolidation.

## 2020-02-02 ENCOUNTER — Telehealth: Payer: Self-pay | Admitting: Internal Medicine

## 2020-02-02 NOTE — Telephone Encounter (Signed)
Tried calling [name] and there was no answer and no option to leave a msg, WCB.

## 2020-02-03 NOTE — Telephone Encounter (Signed)
[name] to set patient up for an appointment [facility]

## 2020-02-03 NOTE — Telephone Encounter (Signed)
Patient's daughter is going to call for a video visit with a provider. She needs a video visit because she is bed bound at this time and can not travel.

## 2020-02-04 ENCOUNTER — Telehealth (HOSPITAL_COMMUNITY): Payer: Self-pay | Admitting: Physician Assistant

## 2020-02-04 IMAGING — DX PORTABLE CHEST - 1 VIEW
1 series · 1 of 1 positions shown · non-contrast
Comparison: CTA chest 10/20/2018 and earlier.

CLINICAL DATA: 58-year-old female with shortness of breath.
Negative for [id number] on 10/19/2018.

EXAM:
PORTABLE CHEST 1 VIEW

[chest ap]
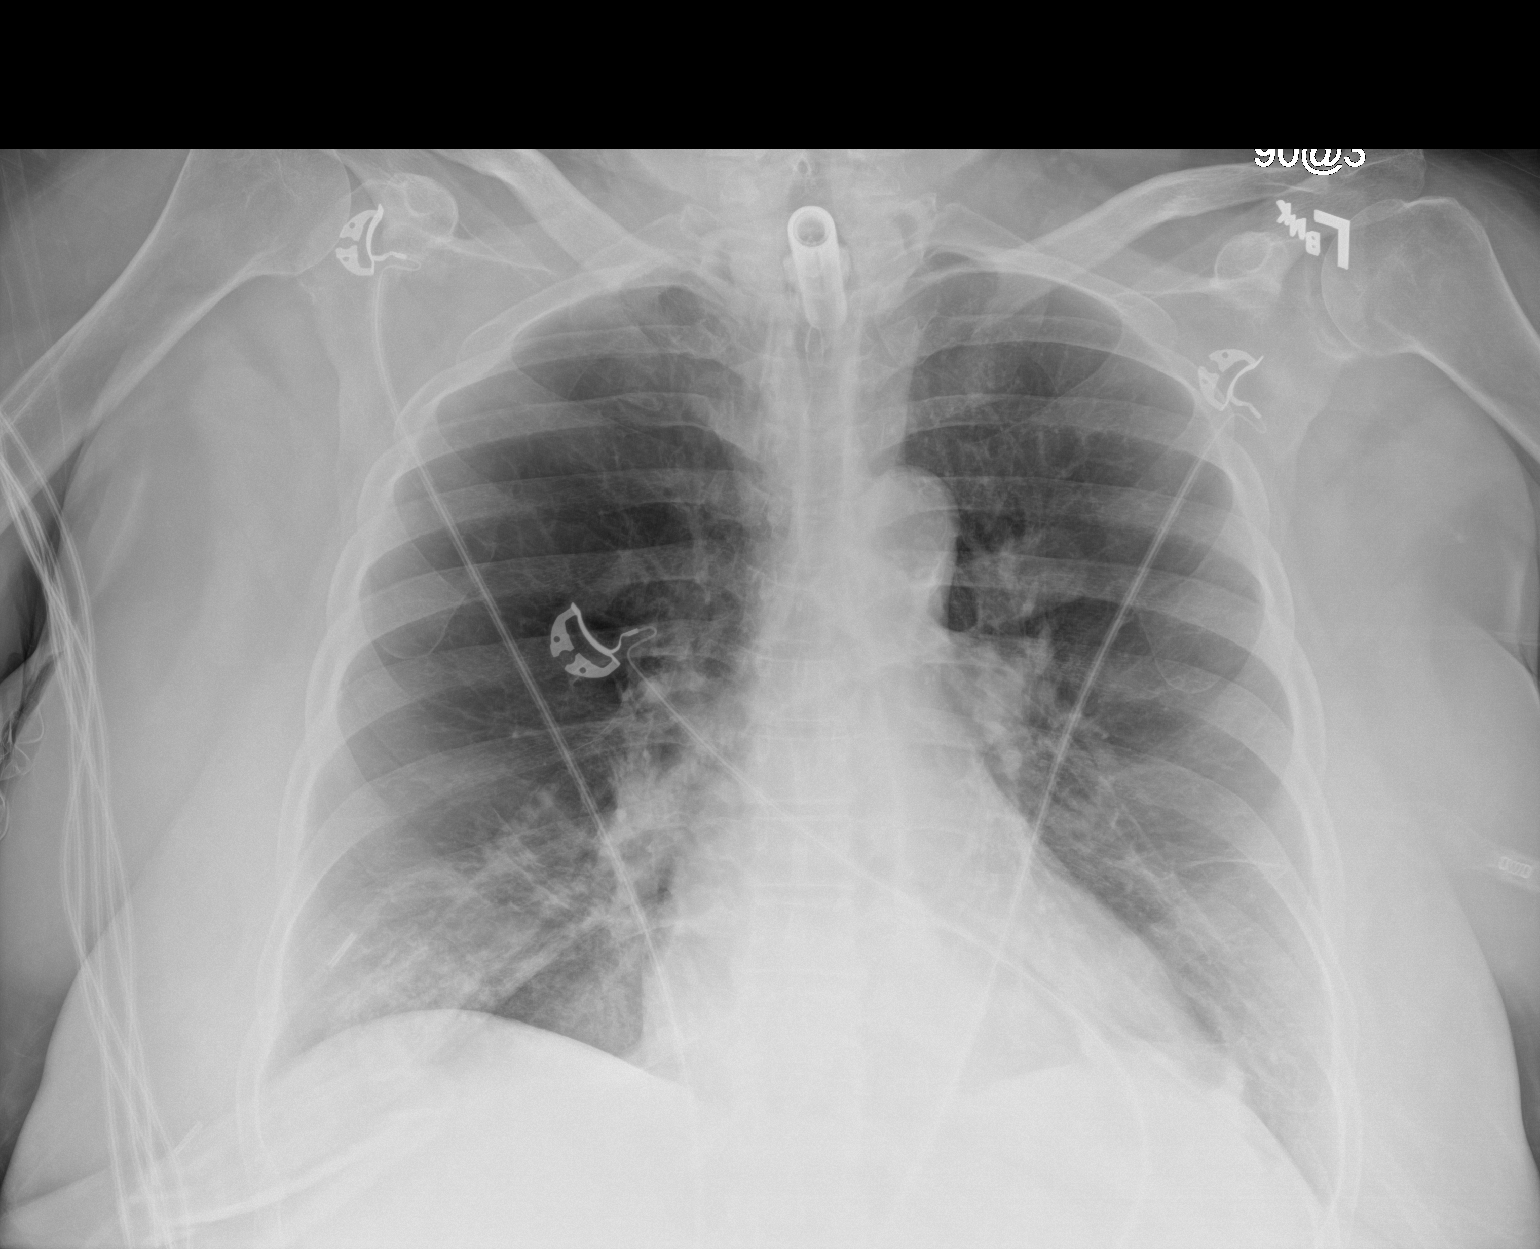

[1 of 1 positions shown; findings below may reference images not displayed]

FINDINGS: Portable AP upright view at 4628 hours. Stable tracheostomy. Stable
cardiac size and mediastinal contours. Stable lung volumes.
Attenuation of lung markings in the upper lobes in keeping with the
emphysema demonstrated by CTA. Mild streaky opacity at both lung
bases appears stable from the recent CT. No pneumothorax, pleural
effusion, pulmonary edema or new opacity.
IMPRESSION: Stable from the recent CTA. Emphysema with streaky bilateral lung
base opacity which most resembles atelectasis and/or scarring.

## 2020-02-04 NOTE — Telephone Encounter (Signed)
I connected by phone with [name] and/or patient's caregiver on 02/04/2020 at 4:38 PM to discuss the potential vaccination through our Homebound vaccination initiative.   Prevaccination Checklist for COVID-19 Vaccines  1.  Are you feeling sick today? no  2.  Have you ever received a dose of a COVID-19 vaccine?  no      If yes, which one? None   3.  Have you ever had an allergic reaction: (This would include a severe reaction [ e.g., anaphylaxis] that required treatment with epinephrine or EpiPen or that caused you to go to the hospital.  It would also include an allergic reaction that occurred within 4 hours that caused hives, swelling, or respiratory distress, including wheezing.) A.  A previous dose of COVID-19 vaccine. no  B.  A vaccine or injectable therapy that contains multiple components, one of which is a COVID-19 vaccine component, but it is not known which component elicited the immediate reaction. no  C.  Are you allergic to polyethylene glycol? no  D. Are you allergic to Polysorbate, which is found in some vaccines, film coated tablets and intravenous steroids?  no   4.  Have you ever had an allergic reaction to another vaccine (other than COVID-19 vaccine) or an injectable medication? (This would include a severe reaction [ e.g., anaphylaxis] that required treatment with epinephrine or EpiPen or that caused you to go to the hospital.  It would also include an allergic reaction that occurred within 4 hours that caused hives, swelling, or respiratory distress, including wheezing.)  no   5.  Have you ever had a severe allergic reaction (e.g., anaphylaxis) to something other than a component of the COVID-19 vaccine, or any vaccine or injectable medication?  This would include food, pet, venom, environmental, or oral medication allergies.  no   6.  Have you received any vaccine in the last 14 days? no   7.  Have you ever had a positive test for COVID-19 or has a doctor ever told you  that you had COVID-19?  no   8.  Have you received passive antibody therapy (monoclonal antibodies or convalescent serum) as a treatment for COVID-19? no   9.  Do you have a weakened immune system caused by something such as HIV infection or cancer or do you take immunosuppressive drugs or therapies?  no   10.  Do you have a bleeding disorder or are you taking a blood thinner? yes , aspirin.   11.  Are you pregnant or breast-feeding? no   12.  Do you have dermal fillers? no   __________________   This patient is a 59 y.o. female that meets the FDA criteria to receive homebound vaccination. Patient or parent/caregiver understands they have the option to accept or refuse homebound vaccination.  Patient passed the pre-screening checklist and would like to proceed with homebound vaccination.  Based on questionnaire above, I recommend the patient be observed for 30 minutes.  There are an estimated 2 other household members/caregivers who are also interested in receiving the vaccine.   I will send the patient's information to our scheduling team who will reach out to schedule the patient and potential caregiver/family members for homebound vaccination.    Angelena Form 02/04/2020 4:38 PM

## 2020-02-17 ENCOUNTER — Ambulatory Visit: Payer: Medicaid Other | Attending: Internal Medicine

## 2020-02-17 ENCOUNTER — Other Ambulatory Visit: Payer: Self-pay

## 2020-02-17 DIAGNOSIS — Z23 Encounter for immunization: Secondary | ICD-10-CM

## 2020-02-17 NOTE — Progress Notes (Signed)
   Covid-19 Vaccination Clinic  Name:  Michelle [name]    MRN: [id number] DOB: 05-Jun-1960  02/17/2020  [name] was observed post Covid-19 immunization for 15 minutes without incident. She was provided with Vaccine Information Sheet and instruction to access the V-Safe system.   [name] was instructed to call 911 with any severe reactions post vaccine: Marland Kitchen Difficulty breathing  . Swelling of face and throat  . A fast heartbeat  . A bad rash all over body  . Dizziness and weakness   Immunizations Administered    Name Date Dose VIS Date Route   Pfizer COVID-19 Vaccine 02/17/2020  2:30 PM 0.3 mL 07/29/2018 Intramuscular   Manufacturer: [employer]   Lot: [id number]   [place]: [id number]

## 2020-03-01 ENCOUNTER — Other Ambulatory Visit: Payer: Medicaid Other | Admitting: Primary Care

## 2020-03-03 ENCOUNTER — Other Ambulatory Visit: Payer: Self-pay

## 2020-03-03 ENCOUNTER — Other Ambulatory Visit: Payer: Medicaid Other | Admitting: Primary Care

## 2020-03-03 DIAGNOSIS — Z515 Encounter for palliative care: Secondary | ICD-10-CM

## 2020-03-03 DIAGNOSIS — J441 Chronic obstructive pulmonary disease with (acute) exacerbation: Secondary | ICD-10-CM

## 2020-03-03 DIAGNOSIS — I5032 Chronic diastolic (congestive) heart failure: Secondary | ICD-10-CM

## 2020-03-03 NOTE — Progress Notes (Signed)
[facility] Consult Note Telephone: [phone]  Fax: [phone]  PATIENT NAME: Michelle [name] [address] [place] [address] [phone] (home)  DOB: [birth year] MRN: [id number]  PRIMARY CARE PROVIDER:    Alvester Chou, NP,  [address] [place] [address] [phone]  REFERRING PROVIDER:   Alvester Chou, NP [address] [place],  [place] [address] [phone]  RESPONSIBLE PARTY:   Extended Emergency Contact Information Primary Emergency Contact: Michelle [name], Michelle [name] Phone: [phone] Mobile Phone: [phone] Relation: Daughter Secondary Emergency Contact: Michelle [name] Address: [address] [place], [place] [address] Michelle [name] of Michelle [name] Phone: [phone] Mobile Phone: [phone] Relation: Daughter  I met face to face with patient and family in home.  ASSESSMENT AND RECOMMENDATIONS:   1. Advance Care Planning/Goals of Care: Goals include to maximize quality of life and symptom management.   I met with Michelle [name] and her family in her home. Recently the household had a case of Covid. They were able to isolate the patient and only one other family member tested positive. Patient and her daughter who is her main caregiver remained negative. She has since had her first Covid injection from the public health department. Any household members that were eligible have also been immunized. Patients goals for her care are to remain in place and be treated in her home for COPD exacerbations.   2. Symptom Management:   Pain control is good  generally, Patient does endorse some left shoulder and facial pain. Area examined no eruptions.  I educated regarding possible etiology such as muscle spasm or possibly zoster. I gave them signs and symptoms to look for. Patient is also due for Shingrix vaccines which she will obtain after she finishes her Covid and flu vaccine.   Incontinence: Patient  endorses urinary incontinence which is bothersome. I suggested that she try some timed voiding,every three hours, and [place] exercises. On our next visit if this has not improved her stress incontinence we can discuss medication For bladder irritability.  Tracheostomy: Michelle [name] appears in place and functioning. Caregiver will clean the outer cannula externally and replace inner cannula. No concerns at this time but needs follow up to assure continued pulmonary management. Ventilator patient and caregiver state that they are still awaiting and insurance card in order to be seen by pulmonology. They (P[facility] And [facility]) maintain her an IV and trach.  Insurance coverage: I suggested that the daughter call Medicaid or look on [place] [employer] website to see the progress of her insurance card. She also states her Medicare is still pending and is a few months away from being in force from her two-year disability application.   3. Follow up Palliative Care Visit: Palliative care will continue to follow for goals of care clarification and symptom management. Return 4-6 weeks or prn.  4. Family /Caregiver/Community Supports: Lives in home with daughter as pcg.  5. Cognitive / Functional decline: a and o x 3, dependent in all adls and iadls.   I spent 60 minutes providing this consultation,  from 0915 to 1015. More than 50% of the time in this consultation was spent coordinating communication.   CHIEF COMPLAINT: dyspnea at times, pain  HISTORY OF PRESENT ILLNESS:  Michelle [name] is a 59 y.o. year old female with multiple medical problems including CIOPD, tracheostomy to NIV, immobility chronic pain, depression. Palliative Care was asked to follow this patient by consultation  request of Michelle Chou, NP to help address advance care planning and goals of care. This is a follow up visit.  CODE STATUS: DNR, full scope of treatment.  PPS: 40%  HOSPICE ELIGIBILITY/DIAGNOSIS: TBD  PAST MEDICAL HISTORY:  Past Medical History:   Diagnosis Date   Allergy    Anxiety    Asthma    CHF (congestive heart failure) ([facility])    Cocaine abuse ([facility])    COPD (chronic obstructive pulmonary disease) ([facility])    Coronary artery disease    Emphysema/COPD ([facility])    Hepatitis C    High cholesterol    Hypertension    Pneumothorax    Tobacco abuse     SOCIAL HX:  Social History   Tobacco Use   Smoking status: Former Smoker    Packs/day: 0.10    Years: 41.00    Pack years: 4.10    Types: Cigarettes    Quit date: 06/09/2017    Years since quitting: 2.7   Smokeless tobacco: Never Used  Substance Use Topics   Alcohol use: No   FAMILY HX:  Family History  Problem Relation Age of Onset   Lung cancer Mother    Asthma Mother    CVA Father    CAD Father    Stroke Father    Breast cancer Maternal Aunt 67   COPD Sister     ALLERGIES:  Allergies  Allergen Reactions   Amoxicillin     Patient has tolerated cephalosporins in the past   Ativan [Lorazepam]     Makes anxiety worse      PERTINENT MEDICATIONS:  Outpatient Encounter Medications as of 03/03/2020  Medication Sig   aspirin 81 MG chewable tablet Chew 81 mg by mouth daily.    blood glucose meter kit and supplies KIT Dispense based on patient and insurance preference. Use up to four times daily as directed. (FOR ICD-9 250.00, 250.01).   budesonide (PULMICORT) 0.25 MG/2ML nebulizer solution Take 2 mLs (0.25 mg total) by nebulization 2 (two) times daily. (Patient taking differently: Take 0.5 mg by nebulization 2 (two) times daily. )   clonazePAM (KLONOPIN) 0.5 MG tablet Take 3 tablets (1.5 mg total) by mouth 3 (three) times daily. (Patient taking differently: Take 0.5 mg by mouth 3 (three) times daily. )   cloNIDine (CATAPRES - DOSED IN MG/24 HR) 0.1 mg/24hr patch Place 0.1 mg onto the skin once a week.   Ensure Max Protein (ENSURE MAX PROTEIN) LIQD Take 330 mLs (11 oz total) by mouth 2 (two) times daily.   famotidine (PEPCID) 20 MG tablet  Place 1 tablet (20 mg total) into feeding tube daily.   ferrous sulfate 325 (65 FE) MG tablet Take 325 mg by mouth every other day.   fluticasone (FLONASE) 50 MCG/ACT nasal spray Place 1 spray into both nostrils daily.    gabapentin (NEURONTIN) 100 MG capsule Take 100 mg by mouth 3 (three) times daily. One tablet in am and mid day, and 3 at bedtime.   guaiFENesin-dextromethorphan (ROBITUSSIN DM) 100-10 MG/5ML syrup Take 10 mLs by mouth every 6 (six) hours as needed for cough. (Patient taking differently: Take 5 mLs by mouth every 6 (six) hours as needed for cough. )   hydrOXYzine (ATARAX/VISTARIL) 25 MG tablet Take 25 mg by mouth every 6 (six) hours as needed for anxiety.   insulin detemir (LEVEMIR) 100 UNIT/ML FlexPen Inject 10 Units into the skin at bedtime.   Insulin Pen Needle (PEN NEEDLES) 32G X 6 MM MISC 1  each by Does not apply route 4 (four) times daily - after meals and at bedtime.   ipratropium-albuterol (DUONEB) 0.5-2.5 (3) MG/3ML SOLN Take 3 mLs by nebulization every 6 (six) hours. (Patient taking differently: Take 3 mLs by nebulization every 6 (six) hours as needed (for shortness of breath). )   mirtazapine (REMERON SOL-TAB) 30 MG disintegrating tablet Take 1 tablet (30 mg total) by mouth at bedtime.   PARoxetine (PAXIL) 20 MG tablet Place 1 tablet (20 mg total) into feeding tube at bedtime. (Patient taking differently: Place 20 mg into feeding tube daily. )   polyethylene glycol (MIRALAX / GLYCOLAX) 17 g packet Place 17 g into feeding tube daily as needed.    senna (SENOKOT) 8.6 MG TABS tablet Place 1-2 tablets into feeding tube at bedtime as needed for mild constipation.    No facility-administered encounter medications on file as of 03/03/2020.    PHYSICAL EXAM / ROS:   Current and past weights: unavailable General: NAD, frail appearing, obese Cardiovascular: S1S2, no chest pain reported, no LE edema  Pulmonary:lungs clear, distant, all fields, occ prod cough, no  increased SOB, oxygen via trach NIV. Abdomen: appetite good, denies  constipation, continent of bowel GU: denies dysuria, incontinent of urine at times MSK:  + joint and ROM abnormalities, non ambulatory, oob in chair on occasion Skin: no rashes or wounds reported Neurological: Weakness, denies pain, insomnia.mood is positive  Jason Coop, NP , DNP, MPH, [facility]  COVID-19 PATIENT SCREENING TOOL  Person answering questions: ____________self______ _____   1.  Is the patient or any family member in the home showing any signs or symptoms regarding respiratory infection?               Person with Symptom- __________NA_________________  a. Fever                                                                          Yes___ No___          ___________________  b. Shortness of breath                                                    Yes___ No___          ___________________ c. Cough/congestion                                       Yes___  No___         ___________________ d. Body aches/pains                                                         Yes___ No___        ____________________ e. Gastrointestinal symptoms (diarrhea, nausea)           Yes___ No___  ____________________  2. Within the past 14 days, has anyone living in the home had any contact with someone with or under investigation for COVID-19?    Yes___ No_X_   Person __________________  ]

## 2020-03-07 ENCOUNTER — Telehealth: Payer: Self-pay | Admitting: Primary Care

## 2020-03-07 NOTE — Telephone Encounter (Signed)
I received a call from daughter [name] stating that patient has had increasing shortness of breath. She is only able to move in the bed, unable to get up,  and now must recover for an hour from just turning. [name] also endorses a fever of 99.8 for the past several days. She is coughing and expectorating green brown sputum. There were 2  known Covid case in the home 4 weeks ago and there are small children, who are not immunized, in the home  currently. She has been treated in the community several times in the past three months for COPD exacerbation and assumed pneumonia. It has been her wish for her care planning to be treated outside the hospital as much as possible. However I have advised family to take her to the emergency given the Covid history in the home and these symptoms which are not normal for the patient. I have suggested that the outcome may be dire  if she does not get to medical assessment outside the home. [name], daughter, voices understanding and will speak to the patient.

## 2020-03-08 ENCOUNTER — Other Ambulatory Visit: Payer: Self-pay

## 2020-03-08 ENCOUNTER — Ambulatory Visit: Payer: Medicaid Other | Attending: Internal Medicine

## 2020-03-08 DIAGNOSIS — Z23 Encounter for immunization: Secondary | ICD-10-CM

## 2020-03-08 NOTE — Progress Notes (Signed)
   Covid-19 Vaccination Clinic  Name:  ANZLEY DIBBERN    MRN: [id number] DOB: March 22, 1961  03/08/2020  Ms. Gutridge was observed post Covid-19 immunization for 15 minutes without incident. She was provided with Vaccine Information Sheet and instruction to access the V-Safe system.   Ms. Sonnen was instructed to call 911 with any severe reactions post vaccine: Marland Kitchen Difficulty breathing  . Swelling of face and throat  . A fast heartbeat  . A bad rash all over body  . Dizziness and weakness   Immunizations Administered    Name Date Dose VIS Date [place] COVID-19 Vaccine 03/08/2020 12:25 PM 0.3 mL 07/29/2018 Intramuscular   Manufacturer: [place]   Lot: [id number]   [facility]: [id number]

## 2020-03-14 ENCOUNTER — Telehealth: Payer: Self-pay | Admitting: Internal Medicine

## 2020-03-14 NOTE — Telephone Encounter (Signed)
Lm for patient's daughter, [name] ([place]).

## 2020-03-15 NOTE — Telephone Encounter (Signed)
Lm x2 for patients daughter, [name]([place]). Letter has been mailed to address on file.

## 2020-03-22 ENCOUNTER — Other Ambulatory Visit: Payer: Medicaid Other | Admitting: Primary Care

## 2020-03-29 ENCOUNTER — Telehealth: Payer: Self-pay | Admitting: Internal Medicine

## 2020-03-29 NOTE — Telephone Encounter (Signed)
error 

## 2020-03-29 NOTE — Telephone Encounter (Signed)
Noted.  Will close encounter.  

## 2020-04-07 ENCOUNTER — Other Ambulatory Visit: Payer: Self-pay

## 2020-04-07 ENCOUNTER — Other Ambulatory Visit: Payer: Medicaid Other | Admitting: Primary Care

## 2020-04-07 DIAGNOSIS — F418 Other specified anxiety disorders: Secondary | ICD-10-CM

## 2020-04-07 DIAGNOSIS — E1165 Type 2 diabetes mellitus with hyperglycemia: Secondary | ICD-10-CM

## 2020-04-07 DIAGNOSIS — I5032 Chronic diastolic (congestive) heart failure: Secondary | ICD-10-CM

## 2020-04-07 DIAGNOSIS — Z515 Encounter for palliative care: Secondary | ICD-10-CM

## 2020-04-07 DIAGNOSIS — Z794 Long term (current) use of insulin: Secondary | ICD-10-CM

## 2020-04-07 DIAGNOSIS — J9610 Chronic respiratory failure, unspecified whether with hypoxia or hypercapnia: Secondary | ICD-10-CM

## 2020-04-07 DIAGNOSIS — Z930 Tracheostomy status: Secondary | ICD-10-CM

## 2020-04-07 NOTE — Progress Notes (Signed)
[facility] Consult Note Telephone: [phone]  Fax: [phone]    Date of encounter: 04/07/20 PATIENT NAME: Michelle [name] [address] [place] [address] [phone] (home)  DOB: April 16, 1961 MRN: [id number]  PRIMARY CARE PROVIDER:    Alvester Chou, NP,  [address] [place] [address] [phone]  REFERRING PROVIDER:   Alvester Chou, NP [address] [place],  [place] [address] [phone]  RESPONSIBLE PARTY:   Extended Emergency Contact Information Primary Emergency Contact: Michelle [name], Michelle [name] Phone: [phone] Mobile Phone: [phone] Relation: Daughter Secondary Emergency Contact: Michelle [name] Address: [address] [place], [place] [address] Michelle [name] of [place] Phone: [phone] Mobile Phone: [phone] Relation: Daughter  I met face to face with patient and family in  Home. Palliative Care was asked to follow this patient by consultation request of Michelle Chou, NP to help address advance care planning and goals of care. This is a follow up  visit.   ASSESSMENT AND RECOMMENDATIONS:   1. Advance Care Planning/Goals of Care: Goals include to maximize quality of life and symptom management. Our advance care planning conversation included a discussion about:     Exploration of personal, cultural or spiritual beliefs that might influence medical decisions   Exploration of goals of care in the event of a sudden injury or illness   Identification  of a healthcare agent - Michelle [name]  Have had plans to move into the urban area to be proximal to health care delivery. However, they report today their housing voucher has expired and she cannot get another one easily. Michelle [name], daughter, states they are doing ok with in home PCP and PC, as well as RT and occasional RN from home vent program. Goals remain to avoid hospitalizations and to be treated in place. Pt is open to and has gone to  some MD office visits as she's more mobile and feeling better.  2. Symptom Management:   I met with Michelle [name] and her family in their home. She appears relaxed and in no distress today. We reviewed all her medications.   Anxiety/depression: They report her anxiety is better. She is taking 0.5 mg of clonazepam tid and they report the lower dose is actually superior in effect to 1.5 mg TID which was her previous dose. Her daughter Michelle [name] also endorses that she has restarted her Paxil due to depression, symptoms of crying and voicing depression. I recommend increasing from 20 mg which is currently taking to 30 mg and to d/c the seroquel she has. She continues on remeron 30 mg at HS. She also states that the hydroxyzine has not been affective and she is not using it.   Dyspnea/Respiratory symptoms: Respiratory status remains stable. She had a bout of increased secretions and slight fever a month or so ago. It was recommended she present to the ED but they ended up staying at home and the episode resolved without incident. She continues home nebs and Trilogy ventilator per trach, which is supported by [facility].  Michelle [name] reports increased secretions. They have been using Mucinex 600 mg once daily. I recommended that they use that every 12 hours as it's usually dosed. Also increase free water and spend more time oob sitting up.  Michelle [name] reports that patient went to the ENT's office for the outer cannula change. She states that the ENT had some difficulty and asks that any future outer cannula change  be done in the office and never at home.  Mobility: Patient has had improved mobility. She has been more willing to go out now that she has completed her Covid vaccinations. Recently her daughter took her out to eat and on another excursion, they went to [place]. Patient was able to get into the self propelled cart and enjoy a shopping trip. She also ambulated about 10 feet with her walker  getting into the car, which is an improvement.   This step has given her  motivation to move more. I've encouraged her to get up and sit up more, ambulate more and take outings as she feels it safe for respiratory exposures.  They will attempt to get a weight soon, as patient is now able to stand with her walker. She also has a doctors appointment in two weeks with pulmonary. If needed, they can obtain a weight on that visit.  Glucose Management: Has had fbs in under 220 range, which would be her target. Michelle [name] endorses sometimes holding if her bg is < 154. She has had a few random pc bg in 300-400 ranges. Discussed reducing her basal dose but not holding it due to action over long period of time. This education was given. She will reduce to 8 units to see if bg is well managed.  3. Follow up Palliative Care Visit: Palliative care will continue to follow for goals of care clarification and symptom management. Return 6 weeks or prn.  4. 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 Has home PCP, PC and home vent RN at times. RT via [facility].  5. Cognitive / Functional decline: A and O x 3, able to do some adls, dependent in all iadls. Mobility improving  I spent 60 minutes providing this consultation,  from 0900 to 1000. More than 50% of the time in this consultation was spent coordinating communication.   CODE STATUS:DNR with Full scope of treatment.  PPS: 50%  HOSPICE ELIGIBILITY/DIAGNOSIS: no  Subjective:  CHIEF COMPLAINT: anxiety  HISTORY OF PRESENT ILLNESS:  Michelle [name] is a 59 y.o. year old female  with h/o airway collapse, frequent PNA and hospital admissions, dyspnea, home ventilator via trach, depression and anxiety. Today she voices some improvement in depression due to some medication adjustments. She appears to be on a good plan of treatment with paxil, clonazepam and remeron at hs. I am monitoring the recent  changes in medications for effectiveness .We are asked to consult around goals of care.    History obtained from review of EMR, discussion with primary team, and  interview with family and/or Ms. Cavagnaro. Records reviewed and summarized above.   CURRENT PROBLEM LIST:  Patient Active Problem List   Diagnosis Date Noted  . Atypical pneumonia 08/29/2019  . HCAP (healthcare-associated pneumonia) 08/28/2019  . Pseudomonas respiratory infection 07/03/2019  . Depression with anxiety 07/03/2019  . Hyperlipidemia associated with type 2 diabetes mellitus ([place]) 07/03/2019  . PEG (percutaneous endoscopic gastrostomy) status ([place]) 07/03/2019  . Chronic pain 07/03/2019  . Hypokalemia 07/03/2019  . DM (diabetes mellitus) ([place]) 06/24/2019  . Chronic respiratory failure requiring continuous mechanical ventilation through tracheostomy ([place]) 10/20/2018  . CAD (coronary artery disease) 10/20/2018  . HTN (hypertension) 10/20/2018  . Chronic diastolic CHF (congestive heart failure) ([place]) 10/20/2018  . Pneumonia 09/17/2018  . Panic disorder 10/29/2017  . Altered mental status   . Pressure injury of skin 10/23/2017  . Acute on chronic respiratory failure with hypoxia  and hypercapnia ([place]) 06/29/2017  . Acute on chronic respiratory failure with hypoxia ([place]) 02/23/2017  . Protein-calorie malnutrition, severe 05/23/2016  . Sepsis ([place]) 05/22/2016  . Malnutrition of moderate degree ([place]) 10/25/2014  . COPD exacerbation ([place]) 10/24/2014  . Tobacco abuse 10/24/2014  . Community acquired pneumonia 10/24/2014   PAST MEDICAL HISTORY:  Active Ambulatory Problems    Diagnosis Date Noted  . COPD exacerbation ([place]) 10/24/2014  . Tobacco abuse 10/24/2014  . Community acquired pneumonia 10/24/2014  . Malnutrition of moderate degree ([place]) 10/25/2014  . Sepsis ([place]) 05/22/2016  . Protein-calorie malnutrition, severe 05/23/2016  . Acute on chronic respiratory failure with hypoxia ([place]) 02/23/2017  . Acute on chronic  respiratory failure with hypoxia and hypercapnia ([place]) 06/29/2017  . Pressure injury of skin 10/23/2017  . Altered mental status   . Panic disorder 10/29/2017  . Pneumonia 09/17/2018  . Chronic respiratory failure requiring continuous mechanical ventilation through tracheostomy ([place]) 10/20/2018  . CAD (coronary artery disease) 10/20/2018  . HTN (hypertension) 10/20/2018  . Chronic diastolic CHF (congestive heart failure) ([place]) 10/20/2018  . DM (diabetes mellitus) ([place]) 06/24/2019  . Pseudomonas respiratory infection 07/03/2019  . Depression with anxiety 07/03/2019  . Hyperlipidemia associated with type 2 diabetes mellitus ([place]) 07/03/2019  . PEG (percutaneous endoscopic gastrostomy) status ([facility]) 07/03/2019  . Chronic pain 07/03/2019  . Hypokalemia 07/03/2019  . HCAP (healthcare-associated pneumonia) 08/28/2019  . Atypical pneumonia 08/29/2019   Resolved Ambulatory Problems    Diagnosis Date Noted  . Acute respiratory failure with hypoxia ([place]) 10/24/2014  . Acute on chronic respiratory failure with hypercapnia ([place]) 11/29/2015  . Acute respiratory failure ([place]) 02/07/2016   Past Medical History:  Diagnosis Date  . Allergy   . Anxiety   . Asthma   . CHF (congestive heart failure) ([place])   . Cocaine abuse ([place])   . COPD (chronic obstructive pulmonary disease) ([place])   . Coronary artery disease   . Emphysema/COPD ([place])   . Hepatitis C   . High cholesterol   . Hypertension   . Pneumothorax    SOCIAL HX:  Social History   Tobacco Use  . Smoking status: Former Smoker    Packs/day: 0.10    Years: 41.00    Pack years: 4.10    Types: Cigarettes    Quit date: 06/09/2017    Years since quitting: 2.8  . Smokeless tobacco: Never Used  Substance Use Topics  . Alcohol use: No   FAMILY HX:  Family History  Problem Relation Age of Onset  . Lung cancer Mother   . Asthma Mother   . CVA Father   . CAD Father   . Stroke Father   . Breast cancer Maternal Aunt 67  . COPD Sister       ALLERGIES:  Allergies  Allergen Reactions  . Amoxicillin     Patient has tolerated cephalosporins in the past  . Ativan [Lorazepam]     Makes anxiety worse      PERTINENT MEDICATIONS:  Outpatient Encounter Medications as of 04/07/2020  Medication Sig  . aspirin 81 MG chewable tablet Chew 81 mg by mouth daily.   . blood glucose meter kit and supplies KIT Dispense based on patient and insurance preference. Use up to four times daily as directed. (FOR ICD-9 250.00, 250.01).  . budesonide (PULMICORT) 0.25 MG/2ML nebulizer solution Take 2 mLs (0.25 mg total) by nebulization 2 (two) times daily. (Patient taking differently: Take 0.5 mg by nebulization 2 (two) times daily. )  .  clonazePAM (KLONOPIN) 0.5 MG tablet Take 0.5 mg by mouth in the morning, at noon, and at bedtime.  . cloNIDine (CATAPRES - DOSED IN MG/24 HR) 0.1 mg/24hr patch Place 0.1 mg onto the skin once a week.  . fluticasone (FLONASE) 50 MCG/ACT nasal spray Place 1 spray into both nostrils daily.   Marland Kitchen gabapentin (NEURONTIN) 100 MG capsule Take 100 mg by mouth 3 (three) times daily. One tablet in am and mid day, and 3 at bedtime.  Marland Kitchen guaiFENesin (MUCINEX) 600 MG 12 hr tablet Take 600 mg by mouth 2 (two) times daily.  . insulin detemir (LEVEMIR) 100 UNIT/ML FlexPen Inject 10 Units into the skin at bedtime.  . Insulin Pen Needle (PEN NEEDLES) 32G X 6 MM MISC 1 each by Does not apply route 4 (four) times daily - after meals and at bedtime.  Marland Kitchen ipratropium-albuterol (DUONEB) 0.5-2.5 (3) MG/3ML SOLN Take 3 mLs by nebulization every 6 (six) hours. (Patient taking differently: Take 3 mLs by nebulization every 6 (six) hours as needed (for shortness of breath). )  . hydrOXYzine (ATARAX/VISTARIL) 25 MG tablet Take 25 mg by mouth every 6 (six) hours as needed for anxiety. (Patient not taking: Reported on 04/07/2020)  . mirtazapine (REMERON SOL-TAB) 30 MG disintegrating tablet Take 1 tablet (30 mg total) by mouth at bedtime.  . polyethylene glycol  (MIRALAX / GLYCOLAX) 17 g packet Place 17 g into feeding tube daily as needed.  (Patient not taking: Reported on 04/07/2020)  . senna (SENOKOT) 8.6 MG TABS tablet Place 1-2 tablets into feeding tube at bedtime as needed for mild constipation.  (Patient not taking: Reported on 04/07/2020)  . [DISCONTINUED] clonazePAM (KLONOPIN) 0.5 MG tablet Take 3 tablets (1.5 mg total) by mouth 3 (three) times daily. (Patient taking differently: Take 0.5 mg by mouth 3 (three) times daily. )  . [DISCONTINUED] Ensure Max Protein (ENSURE MAX PROTEIN) LIQD Take 330 mLs (11 oz total) by mouth 2 (two) times daily.  . [DISCONTINUED] famotidine (PEPCID) 20 MG tablet Place 1 tablet (20 mg total) into feeding tube daily.  . [DISCONTINUED] ferrous sulfate 325 (65 FE) MG tablet Take 325 mg by mouth every other day.  . [DISCONTINUED] guaiFENesin-dextromethorphan (ROBITUSSIN DM) 100-10 MG/5ML syrup Take 10 mLs by mouth every 6 (six) hours as needed for cough. (Patient taking differently: Take 5 mLs by mouth every 6 (six) hours as needed for cough. )  . [DISCONTINUED] PARoxetine (PAXIL) 20 MG tablet Place 1 tablet (20 mg total) into feeding tube at bedtime. (Patient taking differently: Place 20 mg into feeding tube daily. )   No facility-administered encounter medications on file as of 04/07/2020.     Objective: ROS.  General: NAD ENMT: denies dysphagia Cardiovascular: denies chest pain Pulmonary: endorses cough, denies increased SOB Abdomen: endorses good appetite, denies  constipation, endorses continence of bowel at times  GU: denies dysuria, endorses continence of urine at times MSK:  endorses ROM limitations, no falls reported Skin: denies rashes or wounds Neurological: endorses weakness, denies pain, endorses occ insomnia Psych: Endorses anxious/depressed  Mood at times  Physical Exam: Current and past weights:unavailable Constitutional: 123/72 HR 68 RR 18 General :frail appearing, Obese  EYES: anicteric sclera,  lids intact, no discharge  ENMT: intact hearing,oral mucous membranes moist CV: S1S2, RRR, no LE edema Pulmonary: Rhonchi throughout,  no increased work of breathing, + productive  cough, no audible wheezes, trach with oxygen to Trilogy vent. Abdomen: intake 100%,  soft and non tender, no ascites GU: deferred MSK: mild  sacropenia, decreased ROM in all extremities, no contractures of LE,  Ambulatory with assistance short distances  Skin: warm and dry, no rashes or wounds on visible skin Neuro: Weakness,  No cognitive impairment, grossly non -focal Psych: non -anxious affect, A and O x 3 Hem/lymph/immuno/ no widespread bruising   Thank you for the opportunity to participate in the care of Ms. Knoth.  The palliative care team will continue to follow. Please call our office at [phone] if we can be of additional assistance.  Jason Coop, NP , DNP, MPH, AGPCNP-BC, ACHPN  COVID-19 PATIENT SCREENING TOOL  Person answering questions: ____________self______ _____   1.  Is the patient or any family member in the home showing any signs or symptoms regarding respiratory infection?               Person with Symptom- __________NA_________________  a. Fever                                                                          Yes___ No___          ___________________  b. Shortness of breath                                                    Yes___ No___          ___________________ c. Cough/congestion                                       Yes___  No___         ___________________ d. Body aches/pains                                                         Yes___ No___        ____________________ e. Gastrointestinal symptoms (diarrhea, nausea)           Yes___ No___        ____________________  2. Within the past 14 days, has anyone living in the home had any contact with someone with or under investigation for COVID-19?    Yes___ No_X_   Person __________________

## 2020-04-26 ENCOUNTER — Other Ambulatory Visit: Payer: Self-pay

## 2020-04-26 ENCOUNTER — Ambulatory Visit (INDEPENDENT_AMBULATORY_CARE_PROVIDER_SITE_OTHER): Payer: Medicaid Other

## 2020-04-26 ENCOUNTER — Ambulatory Visit
Admission: EM | Admit: 2020-04-26 | Discharge: 2020-04-26 | Disposition: A | Discharge status: Home / Self Care | Payer: Medicaid Other | Attending: Family Medicine | Admitting: Family Medicine

## 2020-04-26 ENCOUNTER — Encounter: Payer: Self-pay | Admitting: Emergency Medicine

## 2020-04-26 ENCOUNTER — Telehealth: Payer: Self-pay | Admitting: Internal Medicine

## 2020-04-26 ENCOUNTER — Telehealth: Payer: Self-pay | Admitting: Primary Care

## 2020-04-26 ENCOUNTER — Ambulatory Visit: Payer: Medicaid Other | Admitting: Pulmonary Disease

## 2020-04-26 ENCOUNTER — Other Ambulatory Visit: Payer: Medicaid Other | Admitting: Primary Care

## 2020-04-26 DIAGNOSIS — R059 Cough, unspecified: Secondary | ICD-10-CM

## 2020-04-26 DIAGNOSIS — J441 Chronic obstructive pulmonary disease with (acute) exacerbation: Secondary | ICD-10-CM | POA: Diagnosis not present

## 2020-04-26 DIAGNOSIS — Z515 Encounter for palliative care: Secondary | ICD-10-CM

## 2020-04-26 DIAGNOSIS — R0602 Shortness of breath: Secondary | ICD-10-CM | POA: Diagnosis not present

## 2020-04-26 DIAGNOSIS — Z930 Tracheostomy status: Secondary | ICD-10-CM

## 2020-04-26 DIAGNOSIS — Z9911 Dependence on respirator [ventilator] status: Secondary | ICD-10-CM

## 2020-04-26 LAB — CBC WITH DIFFERENTIAL/PLATELET
Abs Immature Granulocytes: 0.07 10*3/uL (ref 0.00–0.07)
Basophils Absolute: 0 10*3/uL (ref 0.0–0.1)
Basophils Relative: 1 %
Eosinophils Absolute: 0.1 10*3/uL (ref 0.0–0.5)
Eosinophils Relative: 2 %
HCT: 42.8 % (ref 36.0–46.0)
Hemoglobin: 13.5 g/dL (ref 12.0–15.0)
Immature Granulocytes: 1 %
Lymphocytes Relative: 14 %
Lymphs Abs: 1.1 10*3/uL (ref 0.7–4.0)
MCH: 27.3 pg (ref 26.0–34.0)
MCHC: 31.5 g/dL (ref 30.0–36.0)
MCV: 86.5 fL (ref 80.0–100.0)
Monocytes Absolute: 0.7 10*3/uL (ref 0.1–1.0)
Monocytes Relative: 9 %
Neutro Abs: 6 10*3/uL (ref 1.7–7.7)
Neutrophils Relative %: 73 %
Platelets: 246 10*3/uL (ref 150–400)
RBC: 4.95 MIL/uL (ref 3.87–5.11)
RDW: 14.6 % (ref 11.5–15.5)
WBC: 8 10*3/uL (ref 4.0–10.5)
nRBC: 0 % (ref 0.0–0.2)

## 2020-04-26 LAB — COMPREHENSIVE METABOLIC PANEL
ALT: 18 U/L (ref 0–44)
AST: 24 U/L (ref 15–41)
Albumin: 3.9 g/dL (ref 3.5–5.0)
Alkaline Phosphatase: 83 U/L (ref 38–126)
Anion gap: 9 (ref 5–15)
BUN: 12 mg/dL (ref 6–20)
CO2: 25 mmol/L (ref 22–32)
Calcium: 8.5 mg/dL — ABNORMAL LOW (ref 8.9–10.3)
Chloride: 105 mmol/L (ref 98–111)
Creatinine, Ser: 0.69 mg/dL (ref 0.44–1.00)
GFR, Estimated: >60 mL/min (ref >60)
Glucose, Bld: 200 mg/dL — ABNORMAL HIGH (ref 70–99)
Potassium: 3.8 mmol/L (ref 3.5–5.1)
Sodium: 139 mmol/L (ref 135–145)
Total Bilirubin: 0.4 mg/dL (ref 0.3–1.2)
Total Protein: 7.7 g/dL (ref 6.5–8.1)

## 2020-04-26 MED ORDER — PREDNISONE 10 MG PO TABS: ORAL_TABLET | ORAL | 0 refills | Status: DC

## 2020-04-26 MED ORDER — LEVOFLOXACIN 750 MG PO TABS: 750.0000 mg | ORAL_TABLET | Freq: Every day | ORAL | 0 refills | Status: AC

## 2020-04-26 NOTE — Telephone Encounter (Signed)
Per Aaron Edelman verbally- schedule next available OV with Dr. Mortimer Fries. Dr. Mortimer Fries has no availability in December.  January schedule is not out yet.  Will hold this message in triage to contact patient to schedule ov once schedule is out.

## 2020-04-26 NOTE — ED Triage Notes (Signed)
Patient c/o cough, SOB and fever that started Friday. Family states NP from [facility] sent them here for lab work, CXR and a culture from the trach the patient has.

## 2020-04-26 NOTE — ED Provider Notes (Signed)
[facility]    CSN: [id number] Arrival date & time: 04/26/20  1351      History   Chief Complaint Chief Complaint  Patient presents with  . Cough  . Fever   HPI  59 year old female presents for evaluation the above.  Patient recently placed on steroids for COPD exacerbation.  She was seen today by her palliative care provider.  Was advised to come here for evaluation and further work-up.  Patient has been experiencing cough with foul-smelling and productive sputum, elevated temperature, and increasing shortness of breath.  Currently afebrile.  No relieving factors.  No other reported symptoms.  No other complaints.  Past Medical History:  Diagnosis Date  . Allergy   . Anxiety   . Asthma   . CHF (congestive heart failure) ([place])   . Cocaine abuse ([place])   . COPD (chronic obstructive pulmonary disease) ([place])   . Coronary artery disease   . Emphysema/COPD ([place])   . Hepatitis C   . High cholesterol   . Hypertension   . Pneumothorax   . Tobacco abuse     Patient Active Problem List   Diagnosis Date Noted  . Atypical pneumonia 08/29/2019  . HCAP (healthcare-associated pneumonia) 08/28/2019  . Pseudomonas respiratory infection 07/03/2019  . Depression with anxiety 07/03/2019  . Hyperlipidemia associated with type 2 diabetes mellitus ([place]) 07/03/2019  . PEG (percutaneous endoscopic gastrostomy) status ([place]) 07/03/2019  . Chronic pain 07/03/2019  . Hypokalemia 07/03/2019  . DM (diabetes mellitus) ([place]) 06/24/2019  . Chronic respiratory failure requiring continuous mechanical ventilation through tracheostomy ([place]) 10/20/2018  . CAD (coronary artery disease) 10/20/2018  . HTN (hypertension) 10/20/2018  . Chronic diastolic CHF (congestive heart failure) ([place]) 10/20/2018  . Pneumonia 09/17/2018  . Panic disorder 10/29/2017  . Altered mental status   . Pressure injury of skin 10/23/2017  . Acute on chronic respiratory failure with hypoxia and hypercapnia ([place])  06/29/2017  . Acute on chronic respiratory failure with hypoxia ([place]) 02/23/2017  . Protein-calorie malnutrition, severe 05/23/2016  . Sepsis ([place]) 05/22/2016  . Malnutrition of moderate degree ([place]) 10/25/2014  . COPD exacerbation ([place]) 10/24/2014  . Tobacco abuse 10/24/2014  . Community acquired pneumonia 10/24/2014    Past Surgical History:  Procedure Laterality Date  . CARDIAC CATHETERIZATION    . CHEST TUBE INSERTION    . IR GASTROSTOMY TUBE MOD SED  10/31/2017  . TRACHEOSTOMY TUBE PLACEMENT N/A 10/17/2017   Procedure: TRACHEOSTOMY;  Surgeon: Carloyn Manner, MD;  Location: [facility] ORS;  Service: ENT;  Laterality: N/A;    OB History   No obstetric history on file.      Home Medications    Prior to Admission medications   Medication Sig Start Date End Date Taking? Authorizing Provider  aspirin 81 MG chewable tablet Chew 81 mg by mouth daily.    Yes [provider]  blood glucose meter kit and supplies KIT Dispense based on patient and insurance preference. Use up to four times daily as directed. (FOR ICD-9 250.00, 250.01). 07/05/19  Yes Nicole Kindred A, DO  budesonide (PULMICORT) 0.25 MG/2ML nebulizer solution Take 2 mLs (0.25 mg total) by nebulization 2 (two) times daily. Patient taking differently: Take 0.5 mg by nebulization 2 (two) times daily.  12/31/17  Yes Conforti, John, DO  clonazePAM (KLONOPIN) 0.5 MG tablet Take 0.5 mg by mouth in the morning, at noon, and at bedtime.   Yes [provider]  cloNIDine (CATAPRES - DOSED IN MG/24 HR) 0.1 mg/24hr patch Place  0.1 mg onto the skin once a week.   Yes [provider]  fluticasone (FLONASE) 50 MCG/ACT nasal spray Place 1 spray into both nostrils daily.    Yes [provider]  gabapentin (NEURONTIN) 100 MG capsule Take 100 mg by mouth 3 (three) times daily. One tablet in am and mid day, and 3 at bedtime.   Yes [provider]  guaiFENesin (MUCINEX) 600 MG 12 hr tablet Take 600 mg by  mouth 2 (two) times daily.   Yes [provider]  hydrOXYzine (ATARAX/VISTARIL) 25 MG tablet Take 25 mg by mouth every 6 (six) hours as needed for anxiety.    Yes [provider]  insulin detemir (LEVEMIR) 100 UNIT/ML FlexPen Inject 10 Units into the skin at bedtime.   Yes [provider]  ipratropium-albuterol (DUONEB) 0.5-2.5 (3) MG/3ML SOLN Take 3 mLs by nebulization every 6 (six) hours. Patient taking differently: Take 3 mLs by nebulization every 6 (six) hours as needed (for shortness of breath).  12/31/17  Yes Conforti, John, DO  mirtazapine (REMERON SOL-TAB) 30 MG disintegrating tablet Take 1 tablet (30 mg total) by mouth at bedtime. 07/05/19  Yes Nicole Kindred A, DO  Insulin Pen Needle (PEN NEEDLES) 32G X 6 MM MISC 1 each by Does not apply route 4 (four) times daily - after meals and at bedtime. 07/05/19   Ezekiel Slocumb, DO  levofloxacin (LEVAQUIN) 750 MG tablet Take 1 tablet (750 mg total) by mouth daily for 7 days. 04/26/20 05/03/20  Coral Spikes, DO  predniSONE (DELTASONE) 10 MG tablet 50 mg daily x 2 days, then 40 mg daily x 2 days, then 30 mg daily x 2 days, then 20 mg daily x 2 days, then 10 mg daily x 2 days. 04/26/20   Coral Spikes, DO    Family History Family History  Problem Relation Age of Onset  . Lung cancer Mother   . Asthma Mother   . CVA Father   . CAD Father   . Stroke Father   . Breast cancer Maternal Aunt 67  . COPD Sister     Social History Social History   Tobacco Use  . Smoking status: Former Smoker    Packs/day: 0.10    Years: 41.00    Pack years: 4.10    Types: Cigarettes    Quit date: 06/09/2017    Years since quitting: 2.8  . Smokeless tobacco: Never Used  Vaping Use  . Vaping Use: Never used  Substance Use Topics  . Alcohol use: No  . Drug use: Not Currently    Types: "Crack" cocaine    Comment: Past cocaine use.  Quit 04/2017     Allergies   Amoxicillin and Ativan [lorazepam]   Review of Systems Review  of Systems  Constitutional: Positive for fever.  Respiratory: Positive for cough and shortness of breath.    Physical Exam Triage Vital Signs ED Triage Vitals  Enc Vitals Group     BP 04/26/20 1521 130/68     Pulse Rate 04/26/20 1521 96     Resp 04/26/20 1521 20     Temp 04/26/20 1521 98.7 F (37.1 C)     Temp Source 04/26/20 1521 Oral     SpO2 04/26/20 1521 99 %     Weight --      Height 04/26/20 1432 _0  (1.6 m)     Head Circumference --      Peak Flow --  Pain Score 04/26/20 1429 0     Pain Loc --      Pain Edu? --      Excl. in [place]? --    No data found.  Updated Vital Signs BP 130/68   Pulse 96   Temp 98.7 F (37.1 C) (Oral)   Resp 20   Ht _0  (1.6 m)   LMP 03/06/2005 (Approximate)   SpO2 99%   BMI 33.66 kg/m   Visual Acuity Right Eye Distance:   Left Eye Distance:   Bilateral Distance:    Right Eye Near:   Left Eye Near:    Bilateral Near:     Physical Exam Vitals and nursing note reviewed.  Constitutional:      Appearance: She is obese.     Comments: Chronically ill appearing.   HENT:     Head: Normocephalic and atraumatic.     Mouth/Throat:     Comments: Trach in place. Eyes:     General:        Right eye: No discharge.        Left eye: No discharge.     Conjunctiva/sclera: Conjunctivae normal.  Cardiovascular:     Rate and Rhythm: Normal rate and regular rhythm.  Pulmonary:     Effort: Pulmonary effort is normal.     Comments: Coarse breath sounds. Neurological:     Mental Status: She is alert.  Psychiatric:        Mood and Affect: Mood normal.        Behavior: Behavior normal.    UC Treatments / Results  Labs (all labs ordered are listed, but only abnormal results are displayed) Labs Reviewed  COMPREHENSIVE METABOLIC PANEL - Abnormal; Notable for the following components:      Result Value   Glucose, Bld 200 (*)    Calcium 8.5 (*)    All other components within normal limits  CULTURE, RESPIRATORY  CBC WITH  DIFFERENTIAL/PLATELET    EKG   Radiology DG Chest 2 View  Result Date: 04/26/2020 CLINICAL DATA:  59 year old female with cough and shortness of breath. EXAM: CHEST - 2 VIEW COMPARISON:  Chest radiograph dated 08/28/2019. FINDINGS: Tracheostomy above the carina. There is slight blunting of the left costophrenic angle, chronic. No focal consolidation or pneumothorax. The cardiac silhouette is within limits. No acute osseous pathology. IMPRESSION: No acute cardiopulmonary process. Electronically Signed   By: Anner Crete M.D.   On: 04/26/2020 15:20    Procedures Procedures (including critical care time)  Medications Ordered in UC Medications - No data to display  Initial Impression / Assessment and Plan / UC Course  I have reviewed the triage vital signs and the nursing notes.  Pertinent labs & imaging results that were available during my care of the patient were reviewed by me and considered in my medical decision making (see chart for details).    59 year old female with a complex past medical history including COPD, chronic respiratory failure with trach in place presents with low-grade temperature, ongoing cough with foul-smelling and discolored sputum as well as shortness of breath.  Chest x-ray was obtained.  Independently reviewed.  No acute infiltrate.  Laboratory studies unremarkable.  No leukocytosis.  Awaiting culture results.  Placing empirically on Levaquin and prednisone for COPD exacerbation.  Final Clinical Impressions(s) / UC Diagnoses   Final diagnoses:  COPD exacerbation [facility])   Discharge Instructions   None    ED Prescriptions    Medication Sig Dispense Auth. Provider  levofloxacin (LEVAQUIN) 750 MG tablet Take 1 tablet (750 mg total) by mouth daily for 7 days. 7 tablet Cristo Ausburn G, DO   predniSONE (DELTASONE) 10 MG tablet 50 mg daily x 2 days, then 40 mg daily x 2 days, then 30 mg daily x 2 days, then 20 mg daily x 2 days, then 10 mg daily x 2 days.  30 tablet Coral Spikes, DO     PDMP not reviewed this encounter.   Coral Spikes, [place] 04/26/20 1657

## 2020-04-26 NOTE — Telephone Encounter (Signed)
Agreed.  It was explained that the patient had acute worsening symptoms.  Had not been seen by our office recently.  She need an in person evaluation to further review.  Is recommended that she present to the emergency room or urgent care for further evaluation if she is unable to make it to her appointment here on time.  It was requested that we provide antibiotics and/or chest x-ray imaging without patient being seen.  This was declined.  We will not prescribe antibiotics or obtain chest x-ray imaging without an in person evaluation.  It appears the patient has presented to an urgent care in [place], [place].  I would recommend that the patient is scheduled for follow-up with DK whenever his schedule opens up with additional availabilities as patient has not been seen in person in our office for quite some time.  Wyn Quaker, FNP

## 2020-04-26 NOTE — Telephone Encounter (Signed)
Several phone calls today regarding Ms Michelle [name] at [facility]. After telemedicine call this morning,  patient went to urgent care and was assessed there. Spoke with patient's primary provider Michelle Chou NP who will follow up with patient with home management. Discussed via message system with B Mack NP at pulmonary medicine regarding patient follow up, will re-iterate need for timely follow up with pulmonary so patient can be managed in the community. Call from family asking about medication. Referred to primary provider and instructed to make pulmonary follow up for as soon as possible. They are aware that home NIV management plan does hinge on reliable pulmonary follow up by patient.

## 2020-04-26 NOTE — Progress Notes (Addendum)
[facility] Palliative Care Consult Note Telephone: [phone]  Fax: [phone]     TELEHEALTH VISIT STATEMENT Due to the COVID-19 crisis, this visit was done via telemedicine from my office. It was initiated and consented to by this patient and/or family.   Date of encounter: 04/26/20 PATIENT NAME: Michelle [name] [address] [place] [address] [phone] (home)  DOB: May 01, 1961 MRN: [id number]  PRIMARY CARE PROVIDER:    Alvester Chou, NP,  [address] [place] [address] [phone]  REFERRING PROVIDER:   Alvester Chou, NP [address] [place],  [place] [address] [phone]  RESPONSIBLE PARTY:   Extended Emergency Contact Information Primary Emergency Contact: Michelle [name], [place] Phone: [phone] Mobile Phone: [phone] Relation: Daughter Secondary Emergency Contact: Michelle [name] Address: [address] [place], [place] [address] Michelle [name] of [place] Phone: [phone] Mobile Phone: [phone] Relation: Daughter   Palliative Care was asked to follow this patient by consultation request of Michelle Chou, NP to help address advance care planning and goals of care. This is a follow up  visit.   ASSESSMENT AND RECOMMENDATIONS:   1. Advance Care Planning/Goals of Care: Goals include to maximize quality of life and symptom management. Our advance care planning conversation included a discussion about:      Exploration of goals of care in the event of a sudden injury or illness .  Reiterated decision to  de-escalate disease focused treatments due to poor prognosis.  2. Symptom Management:   Pulmonary infection: Patient was not able to be seen at pulmonary today and continues to have concerns RE  Ongoing sx of infection. She improved from prednisone 50 mg x 5 days last week but after finishing,  is slightly febrile with thick yellow and odorous sputum as reported by her daughter. She has  99.9 F today. We discussed her desire to deescalate her care and avoid hospital stay. I advised her to present at urgent care for labs, xray and culture of trach area. They would prefer home management if pt needs iv abx and /or other modalities. Pt denies dyspnea and hypoxia sx. I am happy to speak with UC provider if needed about care management.  3. Follow up Palliative Care Visit: Palliative care will continue to follow for goals of care clarification and symptom management. Return 3-4  weeks or prn.  4. Family /Caregiver/Community Supports: Daughters are caregivers, known to Palliative care and PCP which are community based.  5. Cognitive / Functional decline: A and O x 3, able to be out with help.  I spent 25 minutes providing this consultation,  from 1300 to 1325. More than 50% of the time in this consultation was spent coordinating communication.   CODE STATUS:DNR  PPS: 50%  HOSPICE ELIGIBILITY/DIAGNOSIS: TBD  Subjective:  CHIEF COMPLAINT: fever, sputum production  HISTORY OF PRESENT ILLNESS:  Michelle [name] is a 59 y.o. year old female  with increased yellow sputum, fever and vent dependent in the community. She has had worsening sputum production and low grade fever which was better following a prednisone burst but now reappears.   We are asked to consult around clarifying care goals and ongoing complex medical management.    History obtained from review of EMR, discussion with primary team, and  interview with family, caregiver  and/or [name]. Records reviewed and summarized above.  CURRENT PROBLEM LIST:  Patient Active Problem List   Diagnosis Date  Noted  . Atypical pneumonia 08/29/2019  . HCAP (healthcare-associated pneumonia) 08/28/2019  . Pseudomonas respiratory infection 07/03/2019  . Depression with anxiety 07/03/2019  . Hyperlipidemia associated with type 2 diabetes mellitus ([place]) 07/03/2019  . PEG (percutaneous endoscopic gastrostomy) status ([place]) 07/03/2019  .  Chronic pain 07/03/2019  . Hypokalemia 07/03/2019  . DM (diabetes mellitus) ([place]) 06/24/2019  . Chronic respiratory failure requiring continuous mechanical ventilation through tracheostomy ([place]) 10/20/2018  . CAD (coronary artery disease) 10/20/2018  . HTN (hypertension) 10/20/2018  . Chronic diastolic CHF (congestive heart failure) ([place]) 10/20/2018  . Pneumonia 09/17/2018  . Panic disorder 10/29/2017  . Altered mental status   . Pressure injury of skin 10/23/2017  . Acute on chronic respiratory failure with hypoxia and hypercapnia ([place]) 06/29/2017  . Acute on chronic respiratory failure with hypoxia ([place]) 02/23/2017  . Protein-calorie malnutrition, severe 05/23/2016  . Sepsis ([place]) 05/22/2016  . Malnutrition of moderate degree ([place]) 10/25/2014  . COPD exacerbation ([place]) 10/24/2014  . Tobacco abuse 10/24/2014  . Community acquired pneumonia 10/24/2014   PAST MEDICAL HISTORY:  Active Ambulatory Problems    Diagnosis Date Noted  . COPD exacerbation ([place]) 10/24/2014  . Tobacco abuse 10/24/2014  . Community acquired pneumonia 10/24/2014  . Malnutrition of moderate degree ([place]) 10/25/2014  . Sepsis ([place]) 05/22/2016  . Protein-calorie malnutrition, severe 05/23/2016  . Acute on chronic respiratory failure with hypoxia ([place]) 02/23/2017  . Acute on chronic respiratory failure with hypoxia and hypercapnia ([place]) 06/29/2017  . Pressure injury of skin 10/23/2017  . Altered mental status   . Panic disorder 10/29/2017  . Pneumonia 09/17/2018  . Chronic respiratory failure requiring continuous mechanical ventilation through tracheostomy ([place]) 10/20/2018  . CAD (coronary artery disease) 10/20/2018  . HTN (hypertension) 10/20/2018  . Chronic diastolic CHF (congestive heart failure) ([place]) 10/20/2018  . DM (diabetes mellitus) ([place]) 06/24/2019  . Pseudomonas respiratory infection 07/03/2019  . Depression with anxiety 07/03/2019  . Hyperlipidemia associated with type 2 diabetes mellitus ([place])  07/03/2019  . PEG (percutaneous endoscopic gastrostomy) status ([place]) 07/03/2019  . Chronic pain 07/03/2019  . Hypokalemia 07/03/2019  . HCAP (healthcare-associated pneumonia) 08/28/2019  . Atypical pneumonia 08/29/2019   Resolved Ambulatory Problems    Diagnosis Date Noted  . Acute respiratory failure with hypoxia ([place]) 10/24/2014  . Acute on chronic respiratory failure with hypercapnia ([place]) 11/29/2015  . Acute respiratory failure ([place]) 02/07/2016   Past Medical History:  Diagnosis Date  . Allergy   . Anxiety   . Asthma   . CHF (congestive heart failure) ([place])   . Cocaine abuse ([place])   . COPD (chronic obstructive pulmonary disease) ([place])   . Coronary artery disease   . Emphysema/COPD ([place])   . Hepatitis C   . High cholesterol   . Hypertension   . Pneumothorax    SOCIAL HX:  Social History   Tobacco Use  . Smoking status: Former Smoker    Packs/day: 0.10    Years: 41.00    Pack years: 4.10    Types: Cigarettes    Quit date: 06/09/2017    Years since quitting: 2.8  . Smokeless tobacco: Never Used  Substance Use Topics  . Alcohol use: No   FAMILY HX:  Family History  Problem Relation Age of Onset  . Lung cancer Mother   . Asthma Mother   . CVA Father   . CAD Father   . Stroke Father   . Breast cancer Maternal Aunt 67  . COPD Sister  ALLERGIES:  Allergies  Allergen Reactions  . Amoxicillin     Patient has tolerated cephalosporins in the past  . Ativan [Lorazepam]     Makes anxiety worse      PERTINENT MEDICATIONS:  Outpatient Encounter Medications as of 04/26/2020  Medication Sig  . aspirin 81 MG chewable tablet Chew 81 mg by mouth daily.   . blood glucose meter kit and supplies KIT Dispense based on patient and insurance preference. Use up to four times daily as directed. (FOR ICD-9 250.00, 250.01).  . budesonide (PULMICORT) 0.25 MG/2ML nebulizer solution Take 2 mLs (0.25 mg total) by nebulization 2 (two) times daily. (Patient taking differently:  Take 0.5 mg by nebulization 2 (two) times daily. )  . clonazePAM (KLONOPIN) 0.5 MG tablet Take 0.5 mg by mouth in the morning, at noon, and at bedtime.  . cloNIDine (CATAPRES - DOSED IN MG/24 HR) 0.1 mg/24hr patch Place 0.1 mg onto the skin once a week.  . fluticasone (FLONASE) 50 MCG/ACT nasal spray Place 1 spray into both nostrils daily.   Marland Kitchen gabapentin (NEURONTIN) 100 MG capsule Take 100 mg by mouth 3 (three) times daily. One tablet in am and mid day, and 3 at bedtime.  [place] Kitchen guaiFENesin (MUCINEX) 600 MG 12 hr tablet Take 600 mg by mouth 2 (two) times daily.  . hydrOXYzine (ATARAX/VISTARIL) 25 MG tablet Take 25 mg by mouth every 6 (six) hours as needed for anxiety. (Patient not taking: Reported on 04/07/2020)  . insulin detemir (LEVEMIR) 100 UNIT/ML FlexPen Inject 10 Units into the skin at bedtime.  . Insulin Pen Needle (PEN NEEDLES) 32G X 6 MM MISC 1 each by Does not apply route 4 (four) times daily - after meals and at bedtime.  Mar[place] Kitchen ipratropium-albuterol (DUONEB) 0.5-2.5 (3) MG/3ML SOLN Take 3 mLs by nebulization every 6 (six) hours. (Patient taking differently: Take 3 mLs by nebulization every 6 (six) hours as needed (for shortness of breath). )  . mirtazapine (REMERON SOL-TAB) 30 MG disintegrating tablet Take 1 tablet (30 mg total) by mouth at bedtime.  . polyethylene glycol (MIRALAX / GLYCOLAX) 17 g packet Place 17 g into feeding tube daily as needed.  (Patient not taking: Reported on 04/07/2020)  . senna (SENOKOT) 8.6 MG TABS tablet Place 1-2 tablets into feeding tube at bedtime as needed for mild constipation.  (Patient not taking: Reported on 04/07/2020)   No facility-administered encounter medications on file as of 04/26/2020.    Objective: Deferred  Thank you for the opportunity to participate in the care of Ms. Hartsough.  The palliative care team will continue to follow. Please call our office at [phone] if we can be of additional assistance.  Jason Coop, NP , DNP, MPH,  AGPCNP-BC, [facility]

## 2020-04-26 NOTE — Telephone Encounter (Signed)
Patient was scheduled for office visit today at 11:00 with Carl Best, NP. Patient's daughter, [name](DPR) called in at 11:18 and stated that patient was running late and she would be here around 11:28. [name] was made aware of office protocol. I spoke with Aaron Edelman, who was willing to see patient if she could arrive prior to 11:30. If patient arrived after 11:30, she would have to be worked back in the schedule or ED eval. [name] stated that patient was experiencing sob, fever and productive cough with yellow sputum. Patient needed a breathing treatment prior to leaving her home, which caused her to run late her her apointment. [name] stated that patient is with doctors making house call and she would contact them for recommendations. [name] stated that she would call back to let us know if doctors making house calls could prescribe abx and order CXR. Our office did not receive a call back and patient did not show for appointment.   Routing to [place] as an [employer]

## 2020-05-06 NOTE — Telephone Encounter (Signed)
January's schedule is available.   Lm to offer patient appointment.

## 2020-05-09 NOTE — Telephone Encounter (Signed)
Ov scheduled for 06/23/2020 at 2:00. Patient's daughter, [name] is aware and voiced her understanding.  Nothing further needed.

## 2020-05-24 ENCOUNTER — Other Ambulatory Visit: Payer: Medicaid Other | Admitting: Primary Care

## 2020-06-09 ENCOUNTER — Other Ambulatory Visit: Payer: Medicaid Other | Admitting: Primary Care

## 2020-06-09 ENCOUNTER — Other Ambulatory Visit: Payer: Self-pay

## 2020-06-09 DIAGNOSIS — I5032 Chronic diastolic (congestive) heart failure: Secondary | ICD-10-CM

## 2020-06-09 DIAGNOSIS — J441 Chronic obstructive pulmonary disease with (acute) exacerbation: Secondary | ICD-10-CM

## 2020-06-09 DIAGNOSIS — Z515 Encounter for palliative care: Secondary | ICD-10-CM

## 2020-06-09 NOTE — Progress Notes (Signed)
[facility] Consult Note Telephone: [phone]  Fax: [phone]   Date of encounter: 06/09/20 PATIENT NAME: Michelle [name] [address] [place] [address] [phone] (home)  DOB: [birth year] MRN: [id number]  PRIMARY CARE PROVIDER:    Alvester Chou, NP,  [address] [place] [address] [phone]  REFERRING PROVIDER:   Alvester Chou, NP [address] [place],  [place] [address] [phone]  RESPONSIBLE PARTY:   Extended Emergency Contact Information Primary Emergency Contact: [place], [place] Phone: [phone] Mobile Phone: [phone] Relation: Daughter Secondary Emergency Contact: Elizabeth Sauer Address: [address] [place], [place] [address] Johnnette Litter of [place] Phone: [phone] Mobile Phone: [phone] Relation: Daughter  I met face to face with patient and family in  Home. Palliative Care was asked to follow this patient by consultation request of Alvester Chou, NP to help address advance care planning and goals of care. This is a follow up  visit.   ASSESSMENT AND RECOMMENDATIONS:   1. Advance Care Planning/Goals of Care: Goals include to maximize quality of life and symptom management. Our advance care planning conversation included a discussion about:     Exploration of personal, cultural or spiritual beliefs that might influence medical decisions   Exploration of goals of care in the event of a sudden injury or illness  Reviewed goals to be treated in home and avoid hospital stay. We reviewed recent trip to urgent care which avoided hospital stay by Rx CAP.  2. Symptom Management:   Patient states she is doing well, was Rx with abx from incident in Nov and has recovered. She has avoided hospital stay since 4/21 which has met her need to de-escalate care and focus her care in the home.  They have decided not to look for an in town home. They are managing vent  care well due to their knowledge of patient care. She is due for a trach change at pulmonology next week. We reviewed need for them to do follow up in office with pulmonary and keep appointments on time. They voiced understanding that this was important to maintain current home program.  3. Follow up Palliative Care Visit: Palliative care will continue to follow for goals of care clarification and symptom management. Return 6 weeks or prn.  4. Family /Caregiver/Community Supports: Lives in remote area with family. No Vent program RN.  5. Cognitive / Functional decline: A and o x 3, oob in chair daily.  I spent 40 minutes providing this consultation,  from 0930 to 1010. More than 50% of the time in this consultation was spent coordinating communication.   CODE STATUS: DNR PPS: 50%  HOSPICE ELIGIBILITY/DIAGNOSIS: TBD  Subjective:  CHIEF COMPLAINT: debility  HISTORY OF PRESENT ILLNESS:  Michelle [name] is a 60 y.o. year old female  with  Vent dependence, COPD, chronic respiratory infections,debility.   We are asked to consult around advance care planning and complex medical decision making.    Review and summarization of old Epic records shows or history from other than patient.. Review or lab tests. Review of case with family member [name], daughter.Marland Kitchen  History obtained from review of EMR, discussion with primary team, and  interview with family, caregiver  and/or Ms. Sutter. Records reviewed and summarized above.   CURRENT PROBLEM LIST:  Patient Active Problem List   Diagnosis Date Noted  . Atypical pneumonia 08/29/2019  . HCAP (healthcare-associated pneumonia) 08/28/2019  .  Pseudomonas respiratory infection 07/03/2019  . Depression with anxiety 07/03/2019  . Hyperlipidemia associated with type 2 diabetes mellitus ([place]) 07/03/2019  . PEG (percutaneous endoscopic gastrostomy) status ([place]) 07/03/2019  . Chronic pain 07/03/2019  . Hypokalemia 07/03/2019  . DM (diabetes mellitus) ([place])  06/24/2019  . Chronic respiratory failure requiring continuous mechanical ventilation through tracheostomy ([place]) 10/20/2018  . CAD (coronary artery disease) 10/20/2018  . HTN (hypertension) 10/20/2018  . Chronic diastolic CHF (congestive heart failure) ([place]) 10/20/2018  . Pneumonia 09/17/2018  . Panic disorder 10/29/2017  . Altered mental status   . Pressure injury of skin 10/23/2017  . Acute on chronic respiratory failure with hypoxia and hypercapnia ([place]) 06/29/2017  . Acute on chronic respiratory failure with hypoxia ([place]) 02/23/2017  . Protein-calorie malnutrition, severe 05/23/2016  . Sepsis ([place]) 05/22/2016  . Malnutrition of moderate degree ([place]) 10/25/2014  . COPD exacerbation ([place]) 10/24/2014  . Tobacco abuse 10/24/2014  . Community acquired pneumonia 10/24/2014   PAST MEDICAL HISTORY:  Active Ambulatory Problems    Diagnosis Date Noted  . COPD exacerbation ([place]) 10/24/2014  . Tobacco abuse 10/24/2014  . Community acquired pneumonia 10/24/2014  . Malnutrition of moderate degree ([place]) 10/25/2014  . Sepsis ([place]) 05/22/2016  . Protein-calorie malnutrition, severe 05/23/2016  . Acute on chronic respiratory failure with hypoxia ([place]) 02/23/2017  . Acute on chronic respiratory failure with hypoxia and hypercapnia ([place]) 06/29/2017  . Pressure injury of skin 10/23/2017  . Altered mental status   . Panic disorder 10/29/2017  . Pneumonia 09/17/2018  . Chronic respiratory failure requiring continuous mechanical ventilation through tracheostomy ([place]) 10/20/2018  . CAD (coronary artery disease) 10/20/2018  . HTN (hypertension) 10/20/2018  . Chronic diastolic CHF (congestive heart failure) ([place]) 10/20/2018  . DM (diabetes mellitus) ([place]) 06/24/2019  . Pseudomonas respiratory infection 07/03/2019  . Depression with anxiety 07/03/2019  . Hyperlipidemia associated with type 2 diabetes mellitus ([place]) 07/03/2019  . PEG (percutaneous endoscopic gastrostomy) status ([place]) 07/03/2019  .  Chronic pain 07/03/2019  . Hypokalemia 07/03/2019  . HCAP (healthcare-associated pneumonia) 08/28/2019  . Atypical pneumonia 08/29/2019   Resolved Ambulatory Problems    Diagnosis Date Noted  . Acute respiratory failure with hypoxia ([place]) 10/24/2014  . Acute on chronic respiratory failure with hypercapnia ([place]) 11/29/2015  . Acute respiratory failure ([place]) 02/07/2016   Past Medical History:  Diagnosis Date  . Allergy   . Anxiety   . Asthma   . CHF (congestive heart failure) ([place])   . Cocaine abuse ([place])   . COPD (chronic obstructive pulmonary disease) ([place])   . Coronary artery disease   . Emphysema/COPD ([place])   . Hepatitis C   . High cholesterol   . Hypertension   . Pneumothorax    SOCIAL HX:  Social History   Tobacco Use  . Smoking status: Former Smoker    Packs/day: 0.10    Years: 41.00    Pack years: 4.10    Types: Cigarettes    Quit date: 06/09/2017    Years since quitting: 3.0  . Smokeless tobacco: Never Used  Substance Use Topics  . Alcohol use: No   FAMILY HX:  Family History  Problem Relation Age of Onset  . Lung cancer Mother   . Asthma Mother   . CVA Father   . CAD Father   . Stroke Father   . Breast cancer Maternal Aunt 67  . COPD Sister       ALLERGIES:  Allergies  Allergen Reactions  . Amoxicillin  Patient has tolerated cephalosporins in the past  . Ativan [Lorazepam]     Makes anxiety worse      PERTINENT MEDICATIONS:  Outpatient Encounter Medications as of 06/09/2020  Medication Sig  . aspirin 81 MG chewable tablet Chew 81 mg by mouth daily.   . blood glucose meter kit and supplies KIT Dispense based on patient and insurance preference. Use up to four times daily as directed. (FOR ICD-9 250.00, 250.01).  . budesonide (PULMICORT) 0.25 MG/2ML nebulizer solution Take 2 mLs (0.25 mg total) by nebulization 2 (two) times daily. (Patient taking differently: Take 0.5 mg by nebulization 2 (two) times daily. )  . clonazePAM (KLONOPIN) 0.5 MG  tablet Take 0.5 mg by mouth in the morning, at noon, and at bedtime.  . cloNIDine (CATAPRES - DOSED IN MG/24 HR) 0.1 mg/24hr patch Place 0.1 mg onto the skin once a week.  . fluticasone (FLONASE) 50 MCG/ACT nasal spray Place 1 spray into both nostrils daily.   Marland Kitchen gabapentin (NEURONTIN) 100 MG capsule Take 100 mg by mouth 3 (three) times daily. One tablet in am and mid day, and 3 at bedtime.  Marland Kitchen guaiFENesin (MUCINEX) 600 MG 12 hr tablet Take 600 mg by mouth 2 (two) times daily.  . hydrOXYzine (ATARAX/VISTARIL) 25 MG tablet Take 25 mg by mouth every 6 (six) hours as needed for anxiety.   . insulin detemir (LEVEMIR) 100 UNIT/ML FlexPen Inject 10 Units into the skin at bedtime.  . Insulin Pen Needle (PEN NEEDLES) 32G X 6 MM MISC 1 each by Does not apply route 4 (four) times daily - after meals and at bedtime.  Marland Kitchen ipratropium-albuterol (DUONEB) 0.5-2.5 (3) MG/3ML SOLN Take 3 mLs by nebulization every 6 (six) hours. (Patient taking differently: Take 3 mLs by nebulization every 6 (six) hours as needed (for shortness of breath). )  . mirtazapine (REMERON SOL-TAB) 30 MG disintegrating tablet Take 1 tablet (30 mg total) by mouth at bedtime.  . predniSONE (DELTASONE) 10 MG tablet 50 mg daily x 2 days, then 40 mg daily x 2 days, then 30 mg daily x 2 days, then 20 mg daily x 2 days, then 10 mg daily x 2 days.   No facility-administered encounter medications on file as of 06/09/2020.    Objective: ROS  General: NAD EYES: denies vision changes ENMT: denies dysphagia Cardiovascular: denies chest pain Pulmonary: endorses prod cough, denies increased SOB Abdomen: endorses good appetite, denies constipation endorses continence of bowel GU: denies dysuria, endorses continence of urine MSK:  endorses ROM limitations, no falls reported Skin: denies rashes or wounds Neurological: endorses general weakness, endorses occ  pain, denies insomnia, uses sleeping meds Psych: Endorses positive mood Heme/lymph/immuno:  denies bruises, abnormal bleeding  Physical Exam: Current and past weights: Unavailable Constitutional: NAD General: frail appearing, obese  EYES: anicteric sclera,lids intact, no discharge  ENMT: intact hearing,oral mucous membranes moist, dentition intact  CV: S1S2, RRR, no LE edema Pulmonary: LCTA, no increased work of breathing, + cough, no audible wheezes, oxygen 4 L Abdomen: intake 100%, normo-active BS +  4 quadrants, soft and non tender, no ascites MSK: mild sarcopenia, decreased ROM in all extremities, no contractures of LE, non ambulatory Skin: warm and dry, no rashes or wounds on visible skin Neuro: Generalized weakness, no cognitive impairment, grossly non -focal Psych: non-anxious affect, A and O x 3 Hem/lymph/immuno: no widespread bruising  Thank you for the opportunity to participate in the care of Ms. Gotcher.  The palliative care team will continue to  follow. Please call our office at [phone] if we can be of additional assistance.  Jason Coop, NP , DNP, MPH, AGPCNP-BC, ACHPN  COVID-19 PATIENT SCREENING TOOL  Person answering questions: ____________sierra______ _____   1.  Is the patient or any family member in the home showing any signs or symptoms regarding respiratory infection?               Person with Symptom- __________NA_________________  a. Fever                                                                          Yes___ No___          ___________________  b. Shortness of breath                                                    Yes___ No___          ___________________ c. Cough/congestion                                       Yes___  No___         ___________________ d. Body aches/pains                                                         Yes___ No___        ____________________ e. Gastrointestinal symptoms (diarrhea, nausea)           Yes___ No___        ____________________  2. Within the past 14 days, has anyone living in the home  had any contact with someone with or under investigation for COVID-19?    Yes___   No_X_   Person __________________

## 2020-06-20 IMAGING — DX DG CHEST 1V PORT
1 series · 2 of 2 positions shown · non-contrast
Comparison: 10/22/2018

CLINICAL DATA: Shortness of breath

EXAM:
PORTABLE CHEST 1 VIEW

[Series 1: chest ap · 0.14mm/px · 2 of 2 slices shown]
[im 1/2]
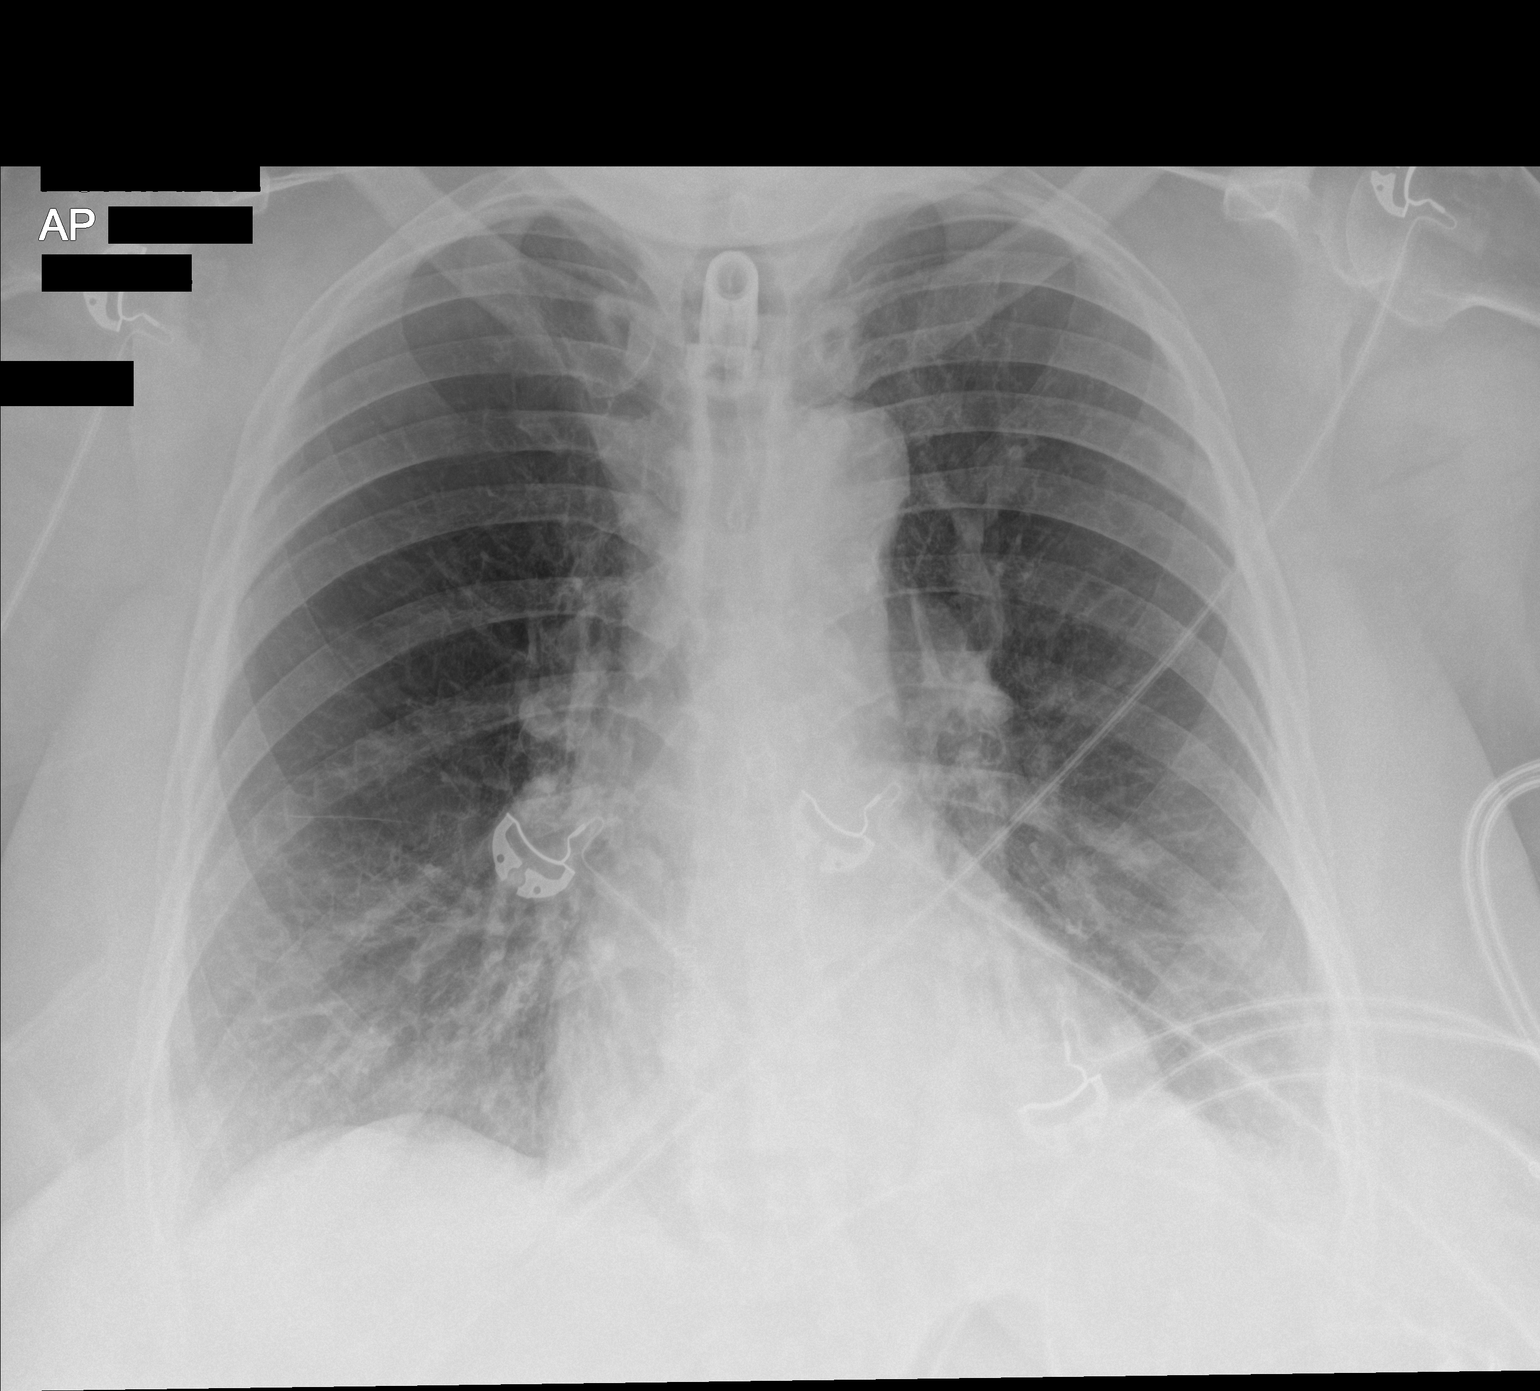
[im 2/2]
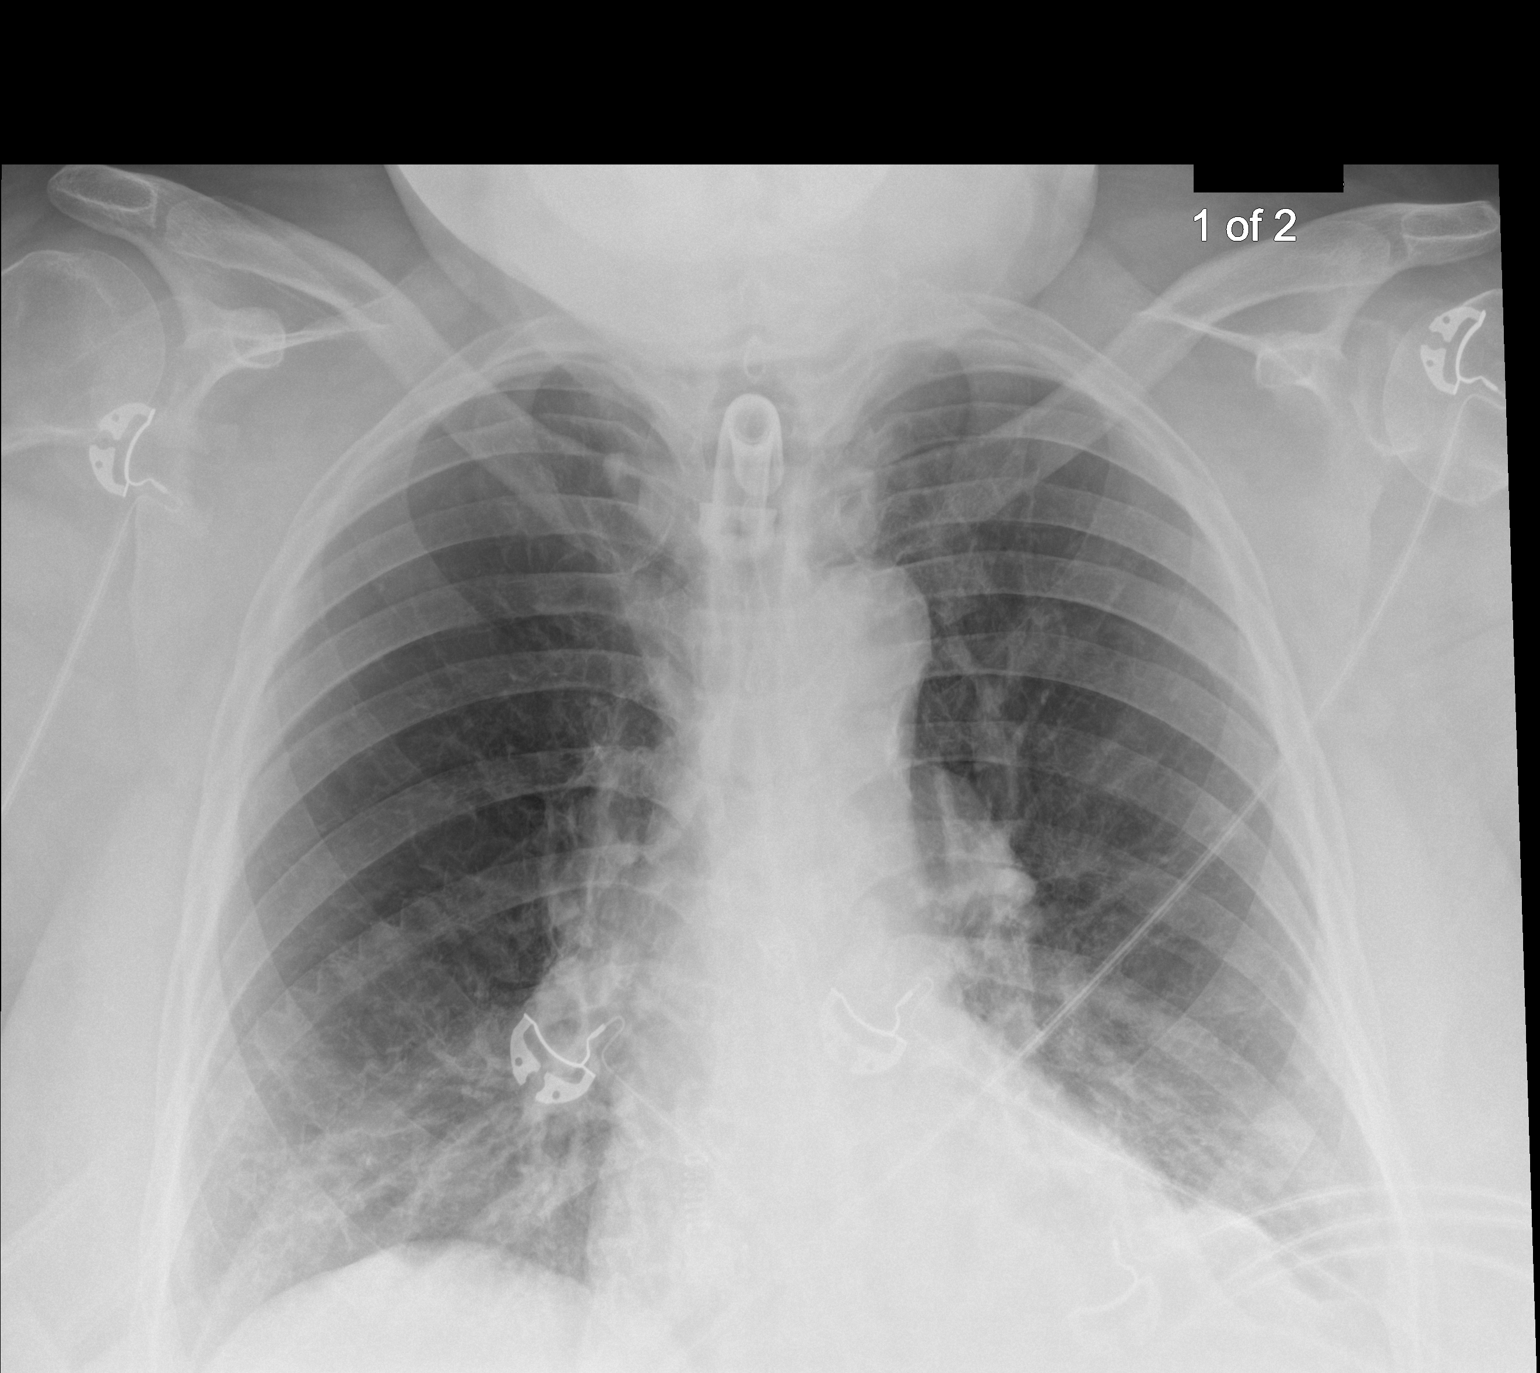

[2 of 2 positions shown; findings below may reference images not displayed]

FINDINGS: Tracheostomy tube in satisfactory position. The heart size and
mediastinal contours are within normal limits. Both lungs are clear.
There is mild bibasilar atelectasis. The visualized skeletal
structures are unremarkable.
IMPRESSION: No active disease.

## 2020-06-21 IMAGING — DX DG CHEST 1V PORT
1 series · 2 of 2 positions shown · non-contrast
Comparison: 03/08/2019

CLINICAL DATA: Acute respiratory failure

EXAM:
PORTABLE CHEST 1 VIEW

[Series 3: chest ap · 0.14mm/px · 2 of 2 slices shown]
[im 1/2]
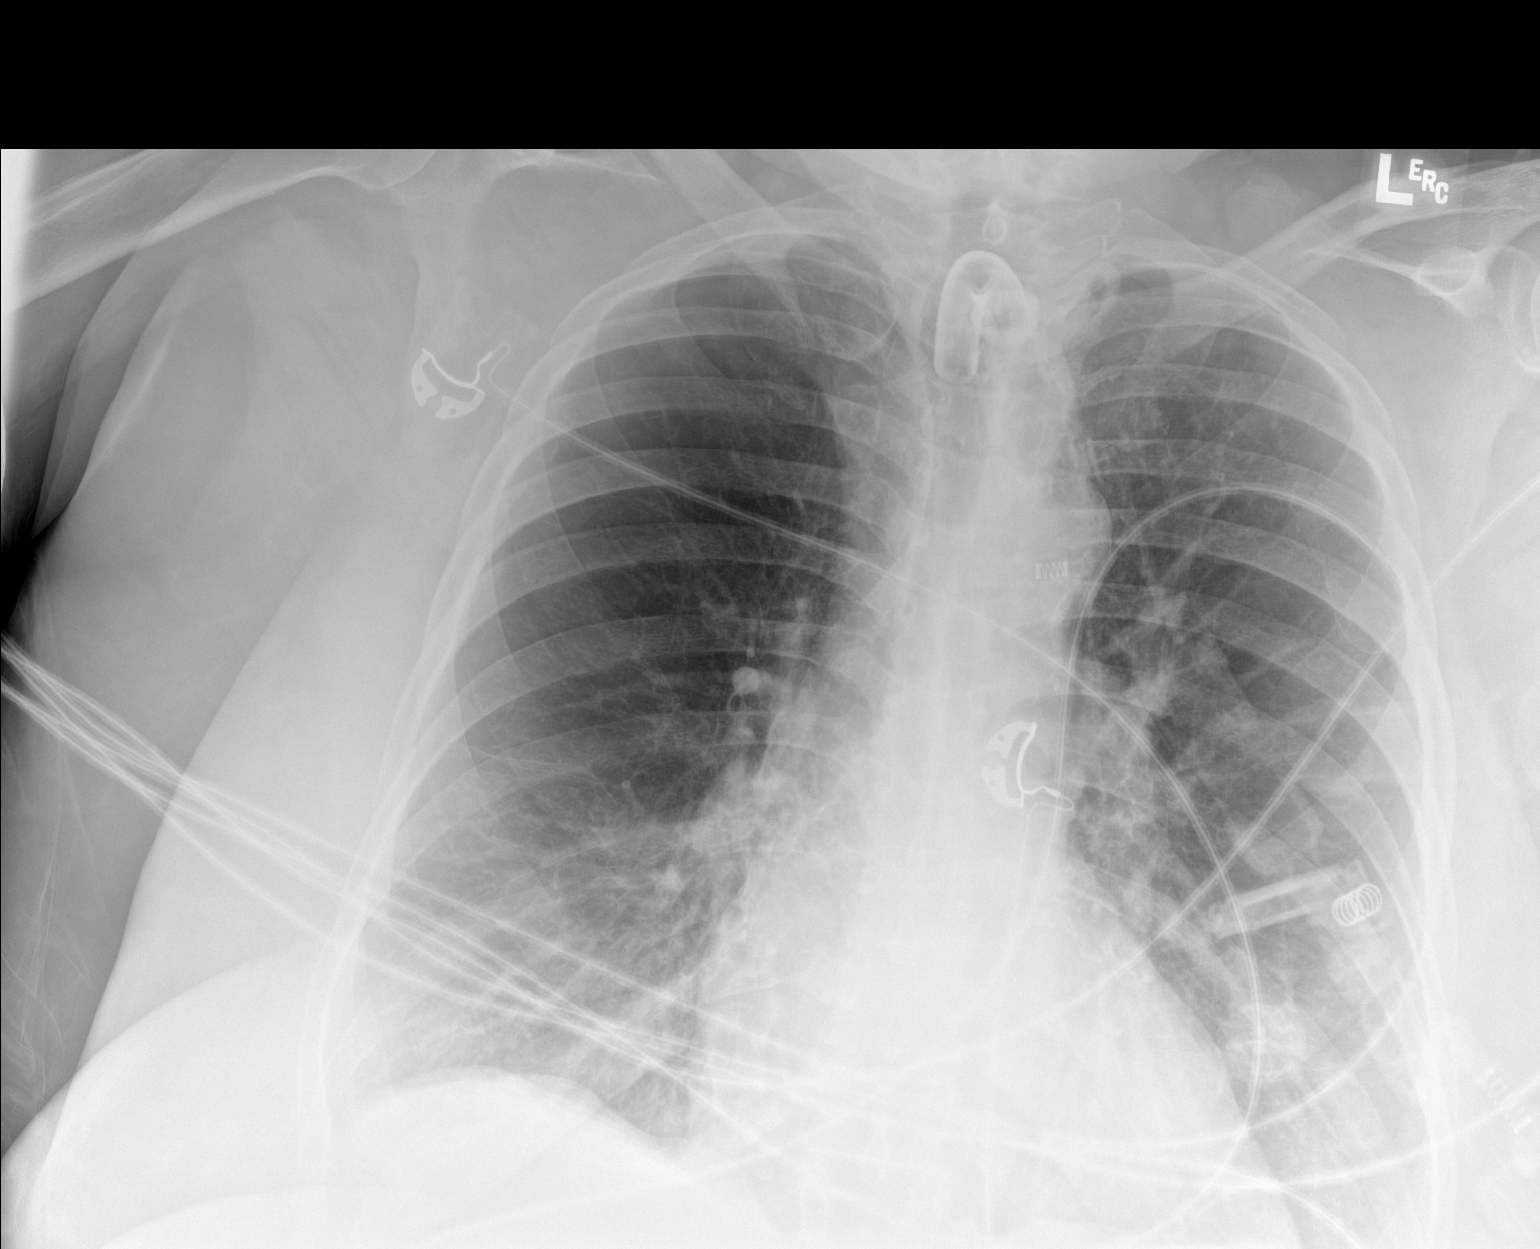
[im 2/2]
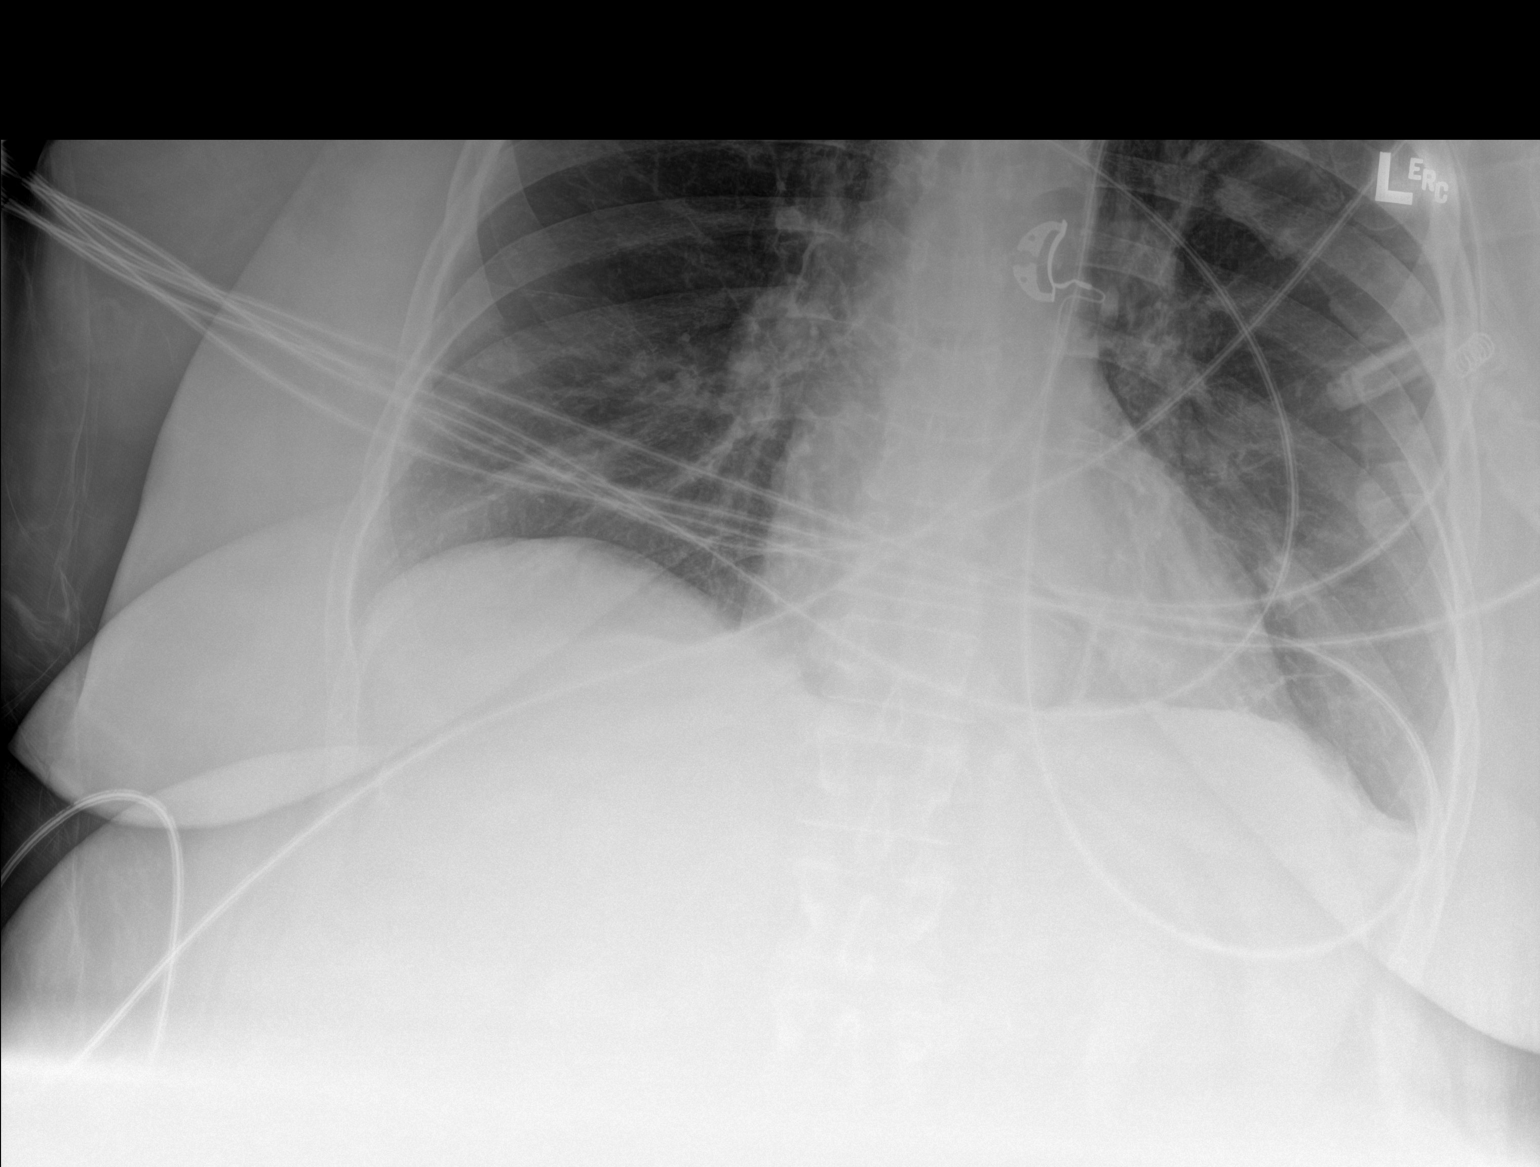

[2 of 2 positions shown; findings below may reference images not displayed]

FINDINGS: The tracheostomy tube terminates above the carina. There are streaky
bibasilar airspace opacities with blunting of the left costophrenic
angle. The opacity at the left lung base has significantly improved
from prior study. There is no pneumothorax. There is no acute
osseous abnormality.
IMPRESSION: Improving bibasilar airspace opacities and trace left pleural
effusion.

## 2020-06-23 ENCOUNTER — Encounter: Payer: Self-pay | Admitting: Internal Medicine

## 2020-06-23 ENCOUNTER — Other Ambulatory Visit: Payer: Self-pay

## 2020-06-23 ENCOUNTER — Ambulatory Visit (INDEPENDENT_AMBULATORY_CARE_PROVIDER_SITE_OTHER): Payer: Medicaid Other | Admitting: Internal Medicine

## 2020-06-23 VITALS — BP 140/80 | HR 82 | Temp 97.3°F | Ht 64.0 in | Wt 183.0 lb

## 2020-06-23 DIAGNOSIS — J449 Chronic obstructive pulmonary disease, unspecified: Secondary | ICD-10-CM | POA: Diagnosis not present

## 2020-06-23 DIAGNOSIS — Z23 Encounter for immunization: Secondary | ICD-10-CM | POA: Diagnosis not present

## 2020-06-23 DIAGNOSIS — Z9911 Dependence on respirator [ventilator] status: Secondary | ICD-10-CM

## 2020-06-23 DIAGNOSIS — J9610 Chronic respiratory failure, unspecified whether with hypoxia or hypercapnia: Secondary | ICD-10-CM | POA: Diagnosis not present

## 2020-06-23 NOTE — Progress Notes (Signed)
Name: Michelle [name] MRN: [id number] DOB: 1961-04-07     CONSULTATION DATE: 06/23/2020  REFERRING MD : [facility]   Synopsis 60 year old female, former smoker.  Last medical history significant for severe COPD with chronic tracheostomy, chronic respiratory failure, asthma, atypical pneumonia/Pseudomonas respiratory infection, history of pneumothorax, tobacco abuse, cocaine abuse, CHF, coronary artery disease, hepatitis C, diabetes mellitus type 2.  Patient of Dr. Mortimer Fries, last seen in office December 08, 2018.  Hospital admissions for COPD exacerbation and multi focal pneumonia.  [name] readmitted in March for hospital-acquired pneumonia and sepsis. Recurrent bouts of pneumonia Recent admission aspiration pneumonia and COPD exacerbation Pseudomonas (Intermediate to fluoroquinolones) and mixed gram positive/gram negative organisms.  Hospital course 08/28/2019-09/03/2019: Admitted with fevers Treated for tracheitis vs healthcare associated pneumonia with aztreonam, Flagyl, vancomycin She was placed on vent for full support and started on bronchodilators.  She had full bed physiotherapy and chest PT.       CHIEF COMPLAINT:  Follow-up resp failure Follow-up COPD    HISTORY OF PRESENT ILLNESS: 60 yo woman with hx of COPD c/b chronic hypoxic respiratory failure s/p tracheostomy on home vent, CAD She has end-stage COPD chronic vent dependent status post trach Inpatient swallow study however showed no concern for aspiration at [facility]  Patient eating foods by mouth without difficulty   Acute on Chronic Hypoxic Respiratory Failure  COPD: History of severe COPD, previously followed by [facility] pulm. Required tracheostomy 10/2017 after prolonged intubation for COPD exacerbation complicated by pneumonia and difficult vent wean. After failing trach collar trials was discharged with home ventilatory support.    CAD: Had MI in 2009, though prior records indicate in setting of cocaine use. Continued home ASA and  statin.   Oxygen Yes Mode: ventilator   home vent settings  Vent Mode: PRVC FiO2 (%): 40 % S RR: 16 S VT: 450 mL PEEP: 5 cm H20   Overall prognosis is poor due to end-stage respiratory disease And has debilitating chronic respiratory failure  No exacerbation at this time No evidence of heart failure at this time No evidence or signs of infection at this time No respiratory distress No fevers, chills, nausea, vomiting, diarrhea No evidence of lower extremity edema No evidence hemoptysis    PAST MEDICAL HISTORY :   has a past medical history of Allergy, Anxiety, Asthma, CHF (congestive heart failure) ([place]), Cocaine abuse ([place]), COPD (chronic obstructive pulmonary disease) ([place]), Coronary artery disease, Emphysema/COPD ([place]), Hepatitis C, High cholesterol, Hypertension, Pneumothorax, and Tobacco abuse.  has a past surgical history that includes Cardiac catheterization; Chest tube insertion; Tracheostomy tube placement (N/A, 10/17/2017); and IR GASTROSTOMY TUBE MOD SED (10/31/2017). Prior to Admission medications   Medication Sig Start Date End Date Taking? Authorizing Provider  AMBULATORY NON FORMULARY MEDICATION Medication Name: Pulse oximeter DME:[facility] DX J44.9....[id number] 01/08/18   Flora Lipps, MD  aspirin 81 MG chewable tablet Chew 81 mg by mouth at bedtime.     [provider]  budesonide (PULMICORT) 0.25 MG/2ML nebulizer solution Take 2 mLs (0.25 mg total) by nebulization 2 (two) times daily. 12/31/17   Conforti, John, DO  cetirizine (ZYRTEC) 10 MG tablet Place 10 mg into feeding tube daily.     [provider]  clonazePAM (KLONOPIN) 0.5 MG tablet Place 1 tablet (0.5 mg total) into feeding tube daily. Patient taking differently: Place 1 mg into feeding tube 2 (two) times daily. (may also take as needed for anxiety) 01/07/18   Flora Lipps, MD  famotidine (PEPCID) 20 MG tablet Place  1 tablet (20 mg total) into feeding tube daily. 12/31/17   Conforti, John, DO   fentaNYL (DURAGESIC - DOSED MCG/HR) 25 MCG/HR patch Place 1 patch (25 mcg total) onto the skin every 3 (three) days. 01/07/18   Flora Lipps, MD  fluticasone (FLONASE) 50 MCG/ACT nasal spray Place 1 spray into both nostrils daily.     [provider]  ipratropium-albuterol (DUONEB) 0.5-2.5 (3) MG/3ML SOLN Take 3 mLs by nebulization every 6 (six) hours. Patient taking differently: Take 3 mLs by nebulization every 6 (six) hours as needed (for shortness of breath).  12/31/17   Conforti, John, DO  mirtazapine (REMERON) 30 MG tablet Take 30 mg by mouth at bedtime.    [provider]  oxyCODONE-acetaminophen (PERCOCET/ROXICET) 5-325 MG tablet Place 1 tablet into feeding tube every 8 (eight) hours as needed for severe pain. 01/07/18   Flora Lipps, MD  PARoxetine (PAXIL) 20 MG tablet Place 1 tablet (20 mg total) into feeding tube at bedtime. 12/31/17   Conforti, John, DO  polyethylene glycol (MIRALAX / GLYCOLAX) 17 g packet Place 17 g into feeding tube daily.    [provider]  predniSONE (DELTASONE) 20 MG tablet Take 2 tablets (40 mg total) by mouth daily with breakfast. 10/23/18   Awilda Bill, NP  QUEtiapine (SEROQUEL) 25 MG tablet Place 1 tablet (25 mg total) into feeding tube at bedtime. 12/31/17   Conforti, John, DO  senna (SENOKOT) 8.6 MG TABS tablet Place 1-2 tablets into feeding tube at bedtime.     [provider]  simvastatin (ZOCOR) 20 MG tablet Take 40 mg by mouth every evening.     [provider]   Allergies  Allergen Reactions  . Amoxicillin     Patient has tolerated cephalosporins in the past  . Ativan [Lorazepam]     Makes anxiety worse     FAMILY HISTORY:  family history includes Asthma in her mother; Breast cancer (age of onset: 60) in her maternal aunt; CAD in her father; COPD in her sister; CVA in her father; Lung cancer in her mother; Stroke in her father. SOCIAL HISTORY:  reports that she quit smoking about 3 years ago. Her smoking  use included cigarettes. She has a 4.10 pack-year smoking history. She has never used smokeless tobacco. She reports previous drug use. Drug: "Crack" cocaine. She reports that she does not drink alcohol.     Review of Systems:  Gen:  Denies  fever, sweats, chills weight loss  HEENT: Denies blurred vision, double vision, ear pain, eye pain, hearing loss, nose bleeds, sore throat Cardiac:  No dizziness, chest pain or heaviness, chest tightness,edema, No JVD Resp:   No cough, -sputum production, +shortness of breath,-wheezing, -hemoptysis,  Gi: Denies swallowing difficulty, stomach pain, nausea or vomiting, diarrhea, constipation, bowel incontinence Gu:  Denies bladder incontinence, burning urine Ext:   Denies Joint pain, stiffness or swelling Skin: Denies  skin rash, easy bruising or bleeding or hives Endoc:  Denies polyuria, polydipsia , polyphagia or weight change Psych:   Denies depression, insomnia or hallucinations  Other:  All other systems negative      BP 140/80 (BP Location: Right Arm, Patient Position: Sitting, Cuff Size: Normal)   Pulse 82   Temp (!) 97.3 F (36.3 C) (Temporal)   Ht 5\' 4"  (1.626 m)   Wt 183 lb (83 kg)   LMP 03/06/2005 (Approximate)   SpO2 98%   BMI 31.41 kg/m     Physical Examination:   General Appearance:  No distress s/p TRACH on VENT Neuro:without focal findings,   HEENT: PERRLA, EOM intact.   Pulmonary: normal breath sounds, No wheezing.  CardiovascularNormal S1,S2.  No m/r/g.   Abdomen: Benign, Soft, non-tender. PSYCHIATRIC: Mood, affect within normal limits.   ALL OTHER ROS ARE NEGATIVE    MEDICATIONS: I have reviewed all medications and confirmed regimen as documented       ASSESSMENT AND PLAN SYNOPSIS  60 year old obese African-American female with severe end-stage COPD and end-stage chronic hypoxic and hypercapnic respiratory failure with severe debilitating respiratory disease status post tracheostomy chronic vent  dependent with a previous history of recurrent bouts of pneumonia and aspiration pneumonia    Chronic hypercapnic respiratory failure from COPD Continue ventilator support as prescribed Patient needs ventilator to survive Patient uses and benefits from therapy  Chronic hypoxic respiratory failure from COPD Continue oxygen as prescribed   End-stage COPD Continue Pulmicort nebs twice daily DuoNebs every 6 hours     COVID-19 EDUCATION: The signs and symptoms of COVID-19 were discussed with the patient and how to seek care for testing.  The importance of social distancing was discussed today. Hand Washing Techniques and avoid touching face was advised.  MEDICATION ADJUSTMENTS/LABS AND TESTS ORDERED: CONTINUE VENT SUPPORT CONTINUE OXYGEN CONTINUE NEB THERAPY  CURRENT MEDICATIONS REVIEWED AT LENGTH WITH PATIENT TODAY   Patient/Family are satisfied with Plan of action and management. All questions answered  Follow up in 6 months  Total time spent 34 minutes   Corrin Parker, M.D.  [facility]  Medical Director [place] Director [facility] Cardio-Pulmonary Department

## 2020-06-23 NOTE — Patient Instructions (Signed)
CONTINUE VENT SUPPORT CONTINUE OXYGEN CONTINUE NEB THERAPY

## 2020-06-26 IMAGING — DX DG CHEST 1V PORT
1 series · 1 of 1 positions shown · non-contrast
Comparison: Radiograph 03/09/2019

CLINICAL DATA: Acute respiratory failure

EXAM:
PORTABLE CHEST 1 VIEW

[chest ap]
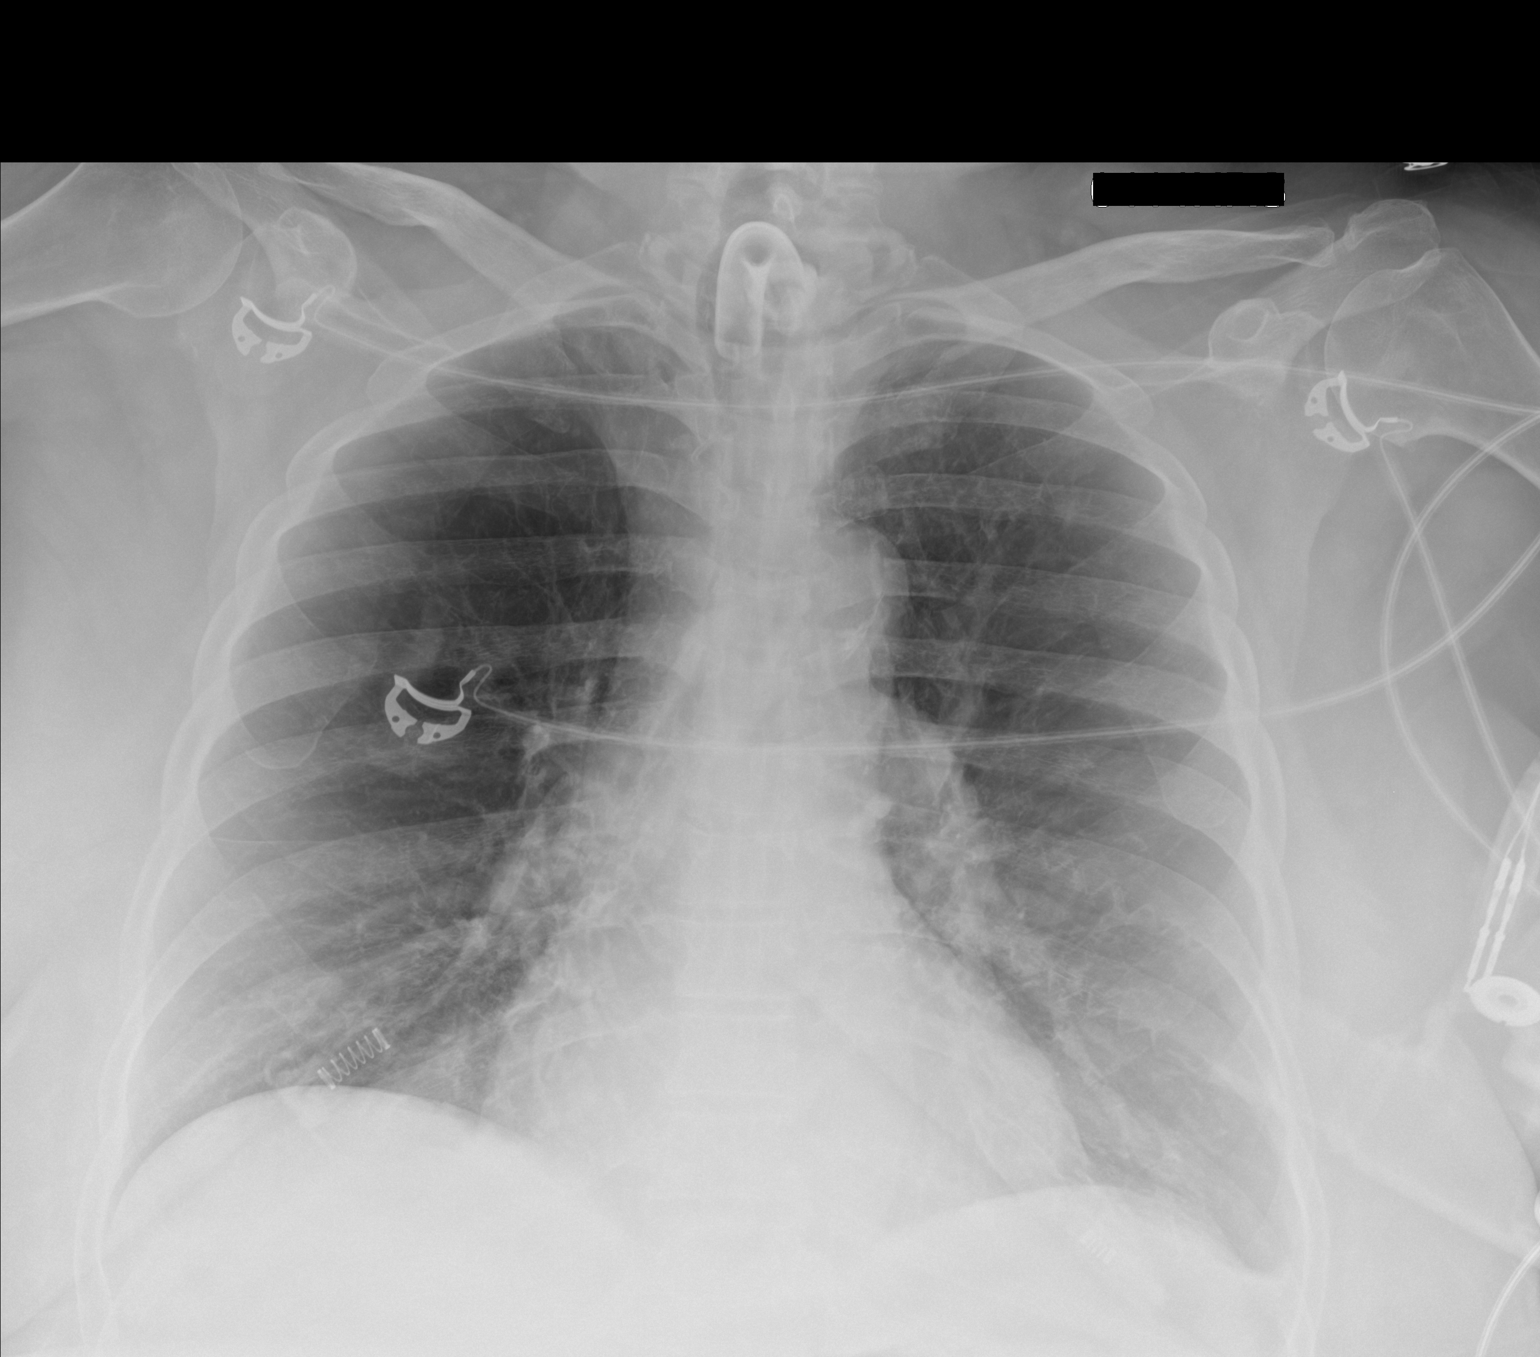

[1 of 1 positions shown; findings below may reference images not displayed]

FINDINGS: Tracheostomy tube unchanged. Stable cardiac silhouette. Emphysema
the upper lobes. Mild basilar atelectasis. No effusion, infiltrate
pneumothorax.
IMPRESSION: 1. No interval change.
2. Emphysema.
3. Basilar atelectasis.

## 2020-08-04 ENCOUNTER — Other Ambulatory Visit: Payer: Self-pay

## 2020-08-04 ENCOUNTER — Other Ambulatory Visit: Payer: Medicaid Other | Admitting: Primary Care

## 2020-08-04 DIAGNOSIS — R0602 Shortness of breath: Secondary | ICD-10-CM

## 2020-08-04 DIAGNOSIS — J9610 Chronic respiratory failure, unspecified whether with hypoxia or hypercapnia: Secondary | ICD-10-CM

## 2020-08-04 DIAGNOSIS — J441 Chronic obstructive pulmonary disease with (acute) exacerbation: Secondary | ICD-10-CM

## 2020-08-04 DIAGNOSIS — Z930 Tracheostomy status: Secondary | ICD-10-CM

## 2020-08-04 DIAGNOSIS — F418 Other specified anxiety disorders: Secondary | ICD-10-CM

## 2020-08-04 NOTE — Progress Notes (Signed)
Therapist, nutritional Palliative Care Consult Note Telephone: [phone]  Fax: [phone]    Date of encounter: 08/04/20 PATIENT NAME: Michelle [name] [address] [place] [address] [phone] (home)  DOB: [birth year] MRN: [id number]  PRIMARY CARE PROVIDER:    Marletta Lor, NP,  [address] [place] [address] [phone]  REFERRING PROVIDER:   Marletta Lor, NP [address] [place],  [place] [address] [phone]  RESPONSIBLE PARTY:   Extended Emergency Contact Information Primary Emergency Contact: Michelle, [name] Home Phone: [phone] Mobile Phone: [phone] Relation: Daughter Secondary Emergency Contact: Michelle [name] Address: [address] [place], [place] [address] [place] of [place] Home Phone: [phone] Mobile Phone: [phone] Relation: Daughter  I met face to face with patient and family in her home. Palliative Care was asked to follow this patient by consultation request of Michelle Lor, NP to help address advance care planning and goals of care. This is a follow up visit.   ASSESSMENT AND RECOMMENDATIONS:   1. Advance Care Planning/Goals of Care: Goals include to maximize quality of life and symptom management. Our advance care planning conversation included a discussion about:     The value and importance of advance care planning   Exploration of personal, cultural or spiritual beliefs that might influence medical decisions   Exploration of goals of care in the event of a sudden injury or illness   Review and updating  of an  advance directive document.  Reviewed today and changed tube feeding option as she'd had a feeding tube which is now d/c.  Chose to de escalate care by d/c choice of feeding tube again should she not be able to eat po with trach.  2. Symptom Management:   Shortness of Breath:  -Ongoing x1 week -Not present at rest, only with exertion  -Has had to increase  home o2 up to 6 lpm -Saturations have maintained at baseline 95-100% -No fevers -Had 5 day 50mg /day prednisone course without improvement -Given pt complex respiratory hx and atypical pneumonia, spoke with primary care NP who agrees with extending prednisone course an additional 5 days 50mg /day and 5 days of azithromycin 500mg /day. This was sent to [facility]. -Continue scheduled nebs and mucinex  -Will follow up with primary care NP, family to call if symptoms do not improve or worsen.  Anxiety: -Well controlled on PRN klonopin, pt currently not taking PRN atarax (advised pt not to take while on azithromycin due to drug-drug interaction risk). Patient and care giver voice understanding.  Nail bed lesion: Recently had gel nails and c/o pain in R 5th finger. Area under nail bed appears abraded. Instructed to soak in warm saline or epsom salts. Have gel nail removed if no improvement.  3. Follow up Palliative Care Visit: Palliative care will continue to follow for goals of care clarification and symptom management. Return 4-6 weeks or prn.  4. Family /Caregiver/Community Supports: Patient has good family support with her daughter who helps manage her complex medical needs. Has care giver as well. Daughter was present today during visit and is agreeable to the plan of care.   5. Cognitive / Functional decline: Cognitively intact, but functional status is limited due to respiratory needs, trach/vent dependent. Dependent in all adls.  I spent 60 minutes providing this consultation,  from 0920 to 1020. More than 50% of the time in this consultation was spent in counseling and care coordination.  CODE STATUS: DNR  PPS: 40%  HOSPICE ELIGIBILITY/DIAGNOSIS: no  Subjective:  CHIEF COMPLAINT: Shortness of breath   HISTORY OF PRESENT ILLNESS:  Michelle [name] is a 60 y.o. year old female  with a PMH of severe COPD with chronic tracheostomy, chronic respiratory failure, asthma, atypical  pneumonia/Pseudomonas respiratory infection, history of pneumothorax, tobacco abuse, cocaine abuse, CHF, coronary artery disease, hepatitis C, diabetes mellitus type 2. Pt seen today in her home for follow up after a course of steroids for suspected COPD exacerbation. She reports that her SOB with exertion started approx one week ago. This has not improved with the steroid course. She denies fevers/chills. She reports increased sputum production that is green-tinted. Her o2 saturation are monitored at home and have stayed at her baseline, but she has needed to increase o2 up to 6 lpm to maintain comfort with exertion.   We are asked to consult around advance care planning and complex medical decision making.    Review and summarization of old Epic records shows or history from other than patient. Decision to obtain labs from PCP and/or Pulmonary. Review or lab tests, radiology,  or medicine. In epic Review of case with family member. Daughter Michelle [name]  History obtained from review of EMR, discussion with primary team, and  interview with family, caregiver  and/or Michelle [name]. Records reviewed and summarized above.   CURRENT PROBLEM LIST:  Patient Active Problem List   Diagnosis Date Noted  . Atypical pneumonia 08/29/2019  . HCAP (healthcare-associated pneumonia) 08/28/2019  . Pseudomonas respiratory infection 07/03/2019  . Depression with anxiety 07/03/2019  . Hyperlipidemia associated with type 2 diabetes mellitus ([place]) 07/03/2019  . PEG (percutaneous endoscopic gastrostomy) status ([place]) 07/03/2019  . Chronic pain 07/03/2019  . Hypokalemia 07/03/2019  . DM (diabetes mellitus) ([place]) 06/24/2019  . Chronic respiratory failure requiring continuous mechanical ventilation through tracheostomy ([place]) 10/20/2018  . CAD (coronary artery disease) 10/20/2018  . HTN (hypertension) 10/20/2018  . Chronic diastolic CHF (congestive heart failure) ([place]) 10/20/2018  . Pneumonia 09/17/2018  . Panic disorder  10/29/2017  . Altered mental status   . Pressure injury of skin 10/23/2017  . Acute on chronic respiratory failure with hypoxia and hypercapnia ([place]) 06/29/2017  . Acute on chronic respiratory failure with hypoxia ([place]) 02/23/2017  . Protein-calorie malnutrition, severe 05/23/2016  . Sepsis ([place]) 05/22/2016  . Malnutrition of moderate degree ([place]) 10/25/2014  . COPD exacerbation ([place]) 10/24/2014  . Tobacco abuse 10/24/2014  . Community acquired pneumonia 10/24/2014   PAST MEDICAL HISTORY:  Active Ambulatory Problems    Diagnosis Date Noted  . COPD exacerbation ([place]) 10/24/2014  . Tobacco abuse 10/24/2014  . Community acquired pneumonia 10/24/2014  . Malnutrition of moderate degree ([place]) 10/25/2014  . Sepsis ([place]) 05/22/2016  . Protein-calorie malnutrition, severe 05/23/2016  . Acute on chronic respiratory failure with hypoxia ([place]) 02/23/2017  . Acute on chronic respiratory failure with hypoxia and hypercapnia ([place]) 06/29/2017  . Pressure injury of skin 10/23/2017  . Altered mental status   . Panic disorder 10/29/2017  . Pneumonia 09/17/2018  . Chronic respiratory failure requiring continuous mechanical ventilation through tracheostomy ([place]) 10/20/2018  . CAD (coronary artery disease) 10/20/2018  . HTN (hypertension) 10/20/2018  . Chronic diastolic CHF (congestive heart failure) ([place]) 10/20/2018  . DM (diabetes mellitus) ([place]) 06/24/2019  . Pseudomonas respiratory infection 07/03/2019  . Depression with anxiety 07/03/2019  . Hyperlipidemia associated with type 2 diabetes mellitus ([place]) 07/03/2019  . PEG (percutaneous endoscopic gastrostomy) status ([place]) 07/03/2019  . Chronic  pain 07/03/2019  . Hypokalemia 07/03/2019  . HCAP (healthcare-associated pneumonia) 08/28/2019  . Atypical pneumonia 08/29/2019   Resolved Ambulatory Problems    Diagnosis Date Noted  . Acute respiratory failure with hypoxia ([place]) 10/24/2014  . Acute on chronic respiratory failure with hypercapnia  ([place]) 11/29/2015  . Acute respiratory failure ([place]) 02/07/2016   Past Medical History:  Diagnosis Date  . Allergy   . Anxiety   . Asthma   . CHF (congestive heart failure) ([place])   . Cocaine abuse ([place])   . COPD (chronic obstructive pulmonary disease) ([place])   . Coronary artery disease   . Emphysema/COPD ([place])   . Hepatitis C   . High cholesterol   . Hypertension   . Pneumothorax    SOCIAL HX:  Social History   Tobacco Use  . Smoking status: Former Smoker    Packs/day: 0.10    Years: 41.00    Pack years: 4.10    Types: Cigarettes    Quit date: 06/09/2017    Years since quitting: 3.1  . Smokeless tobacco: Never Used  Substance Use Topics  . Alcohol use: No   FAMILY HX:  Family History  Problem Relation Age of Onset  . Lung cancer Mother   . Asthma Mother   . CVA Father   . CAD Father   . Stroke Father   . Breast cancer Maternal Aunt 67  . COPD Sister       ALLERGIES:  Allergies  Allergen Reactions  . Amoxicillin     Patient has tolerated cephalosporins in the past  . Ativan [Lorazepam]     Makes anxiety worse      PERTINENT MEDICATIONS:  Outpatient Encounter Medications as of 08/04/2020  Medication Sig  . aspirin 81 MG chewable tablet Chew 81 mg by mouth daily.   . blood glucose meter kit and supplies KIT Dispense based on patient and insurance preference. Use up to four times daily as directed. (FOR ICD-9 250.00, 250.01).  . budesonide (PULMICORT) 0.25 MG/2ML nebulizer solution Take 2 mLs (0.25 mg total) by nebulization 2 (two) times daily. (Patient taking differently: Take 0.5 mg by nebulization 2 (two) times daily.)  . clonazePAM (KLONOPIN) 0.5 MG tablet Take 0.5 mg by mouth in the morning, at noon, and at bedtime.  . cloNIDine (CATAPRES - DOSED IN MG/24 HR) 0.1 mg/24hr patch Place 0.1 mg onto the skin once a week.  . fluticasone (FLONASE) 50 MCG/ACT nasal spray Place 1 spray into both nostrils daily.   Marland Kitchen gabapentin (NEURONTIN) 100 MG capsule Take 100 mg by  mouth 3 (three) times daily. One tablet in am and mid day, and 3 at bedtime.  Marland Kitchen guaiFENesin (MUCINEX) 600 MG 12 hr tablet Take 600 mg by mouth 2 (two) times daily.  . hydrOXYzine (ATARAX/VISTARIL) 25 MG tablet Take 25 mg by mouth every 6 (six) hours as needed for anxiety.   . insulin detemir (LEVEMIR) 100 UNIT/ML FlexPen Inject 10 Units into the skin at bedtime.  . Insulin Pen Needle (PEN NEEDLES) 32G X 6 MM MISC 1 each by Does not apply route 4 (four) times daily - after meals and at bedtime.  Marland Kitchen ipratropium-albuterol (DUONEB) 0.5-2.5 (3) MG/3ML SOLN Take 3 mLs by nebulization every 6 (six) hours. (Patient taking differently: Take 3 mLs by nebulization every 6 (six) hours as needed (for shortness of breath).)  . mirtazapine (REMERON SOL-TAB) 30 MG disintegrating tablet Take 1 tablet (30 mg total) by mouth at bedtime.  . predniSONE (DELTASONE) 10  MG tablet 50 mg daily x 2 days, then 40 mg daily x 2 days, then 30 mg daily x 2 days, then 20 mg daily x 2 days, then 10 mg daily x 2 days.   No facility-administered encounter medications on file as of 08/04/2020.    Objective: ROS  General: NAD EYES: denies vision changes ENMT: denies dysphagia Cardiovascular: denies chest pain Pulmonary: denies  cough, reports increased SOB Abdomen: endorses good appetite, denies constipation, endorses continence of bowel GU: denies dysuria, endorses continence of urine MSK:  endorses ROM limitations, no falls reported Skin: denies rashes or wounds, R 5th digit with nail undernail soreness Neurological: endorses weakness, denies pain, denies insomnia Psych: Endorses positive mood Heme/lymph/immuno: denies bruises, abnormal bleeding  Physical Exam: Current and past weights: no recent weight, appears stable Constitutional:  NAD General: frail appearing, obese  EYES: anicteric sclera, lids intact, no discharge  ENMT: intact hearing,oral mucous membranes moist, dentition intact CV: S1S2, RRR, no LE  edema Pulmonary:  lungs diminished throughout , no increased work of breathing, no cough, no audible wheezes, trach in place, on non invasive ventilator and oxygen at 6 L Abdomen: intake 50-75%, no ascites GU: deferred MSK: mild sarcopenia, decreased ROM in all extremities, no contractures of LE, mostly bed bound Skin: warm and dry, no rashes or wounds on visible skin,  Neuro: Generalized weakness, no cognitive impairment Psych: non-anxious affect, A and O x 4 Hem/lymph/immuno: no widespread bruising   Thank you for the opportunity to participate in the care of Ms. Schweikert.  The palliative care team will continue to follow. Please call our office at [phone] if we can be of additional assistance.  Jason Coop, NP , DNP, MPH, AGPCNP-BC, ACHPN   COVID-19 PATIENT SCREENING TOOL  Person answering questions: _______pt/family____________   1.  Is the patient or any family member in the home showing any signs or symptoms regarding respiratory infection?                  Person with Symptom  ______________na___________ a. Fever/chills/headache                                                        Yes___ No__X_            b. Shortness of breath                                                            Yes_x__ No___           c. Cough/congestion                                               Yes___  No__X_          d. Muscle/Body aches/pains  Yes___ No__X_         e. Gastrointestinal symptoms (diarrhea,nausea)             Yes___ No__X_         f. Sudden loss of smell or taste      Yes___ No__X_        2. Within the past 10 days, has anyone living in the home had any contact with someone with or under investigation for COVID-19?    Yes___ No__X__   Person __________________

## 2020-08-04 NOTE — Progress Notes (Signed)
[facility] Consult Note Telephone: [phone]  Fax: [phone]    Date of encounter: 08/04/20 PATIENT NAME: Michelle [name] [address] [place] [address] [phone] (home)  DOB: December 22, 1960 MRN: [id number]  PRIMARY CARE PROVIDER:    Alvester Chou, NP,  [address] [place] [address] [phone]  REFERRING PROVIDER:   Alvester Chou, NP [address] [place],  [place] [address] [phone]  RESPONSIBLE PARTY:   Extended Emergency Contact Information Primary Emergency Contact: [place], [place] Phone: [phone] Mobile Phone: [phone] Relation: Daughter Secondary Emergency Contact: Elizabeth Sauer Address: [address] [place],  [address] Johnnette Litter of [place] Phone: [phone] Mobile Phone: [phone] Relation: Daughter  I met face to face with patient and family in her home. Palliative Care was asked to follow this patient by consultation request of Alvester Chou, NP to help address advance care planning and goals of care. This is a follow up visit.   ASSESSMENT AND RECOMMENDATIONS:   1. Advance Care Planning/Goals of Care: Goals include to maximize quality of life and symptom management. Our advance care planning conversation included a discussion about:     The value and importance of advance care planning   Experiences with loved ones who have been seriously ill or have died   Exploration of personal, cultural or spiritual beliefs that might influence medical decisions   Exploration of goals of care in the event of a sudden injury or illness   Review and updating or creation of an  advance directive document .  2. Symptom Management:   Shortness of Breath:  -Ongoing x1 week -Not present at rest, only with exertion  -Has had to increase home o2 up to 6 lpm -Saturations have maintained at baseline 95-100% -No fevers -Had 5 day 50m/day prednisone course  without improvement -Given pt complex respiratory hx and atypical pneumonia, poke with primary care NP who agrees with extending prednisone course an additional 5 days 523mday and 5 days of azithromycin 50079may -Continue scheduled nebs and mucinex  -Will follow up with primary care NP, family to call if symptoms do not improve or worsen   Anxiety: -Well controlled on PRN klonopin, pt currently not taking PRN atarax (advised pt not to take while on azithromycin due to drug-drug interaction risk)  3. Follow up Palliative Care Visit: Palliative care will continue to follow for goals of care clarification and symptom management. Return 4 weeks or prn.  4. Family /Caregiver/Community Supports: Patient has good family support with her daughter who helps manage her complex medical needs. Daughter was present today during visit and is agreeable to the plan of care.   5. Cognitive / Functional decline: Cognitively intact, but functional status is limited due to respiratory needs, trach/vent dependent  I spent 60 minutes providing this consultation,  from 0920 to 1020. More than 50% of the time in this consultation was spent in counseling and care coordination.  CODE STATUS: DNR  PPS: 40%  HOSPICE ELIGIBILITY/DIAGNOSIS: TBD  Subjective:  CHIEF COMPLAINT: Shortness of breath   HISTORY OF PRESENT ILLNESS:  DonSUSAN [name] a 60 25o. year old female  with a PMH of severe COPD with chronic tracheostomy, chronic respiratory failure, asthma, atypical pneumonia/Pseudomonas respiratory infection, history of pneumothorax, tobacco abuse, cocaine abuse, CHF, coronary artery disease, hepatitis C, diabetes mellitus type 2. Pt seen today in her home for follow up after a course of  steroids for suspected COPD exacerbation. She reports that her SOB with exertion started approx one week ago. This has not improved with the steroid course. She denies fevers/chills. She reports increased sputum production that is  green-tinted. Her o2 saturation are monitored at home and have stayed at her baseline, but she has needed to increase o2 up to 6 lpm to maintain comfort with exertion.   We are asked to consult around advance care planning and complex medical decision making.    Review and summarization of old Epic records shows or history from other than patient. Recent pulm visit Review or lab tests, radiology,  or medicine. In epic Review of case with family member. Daughter   History obtained from review of EMR, discussion with primary team, and  interview with family, caregiver  and/or Ms. Doris. Records reviewed and summarized above.   CURRENT PROBLEM LIST:  Patient Active Problem List   Diagnosis Date Noted  . Atypical pneumonia 08/29/2019  . HCAP (healthcare-associated pneumonia) 08/28/2019  . Pseudomonas respiratory infection 07/03/2019  . Depression with anxiety 07/03/2019  . Hyperlipidemia associated with type 2 diabetes mellitus ([place]) 07/03/2019  . PEG (percutaneous endoscopic gastrostomy) status ([place]) 07/03/2019  . Chronic pain 07/03/2019  . Hypokalemia 07/03/2019  . DM (diabetes mellitus) ([place]) 06/24/2019  . Chronic respiratory failure requiring continuous mechanical ventilation through tracheostomy ([place]) 10/20/2018  . CAD (coronary artery disease) 10/20/2018  . HTN (hypertension) 10/20/2018  . Chronic diastolic CHF (congestive heart failure) ([place]) 10/20/2018  . Pneumonia 09/17/2018  . Panic disorder 10/29/2017  . Altered mental status   . Pressure injury of skin 10/23/2017  . Acute on chronic respiratory failure with hypoxia and hypercapnia ([place]) 06/29/2017  . Acute on chronic respiratory failure with hypoxia ([place]) 02/23/2017  . Protein-calorie malnutrition, severe 05/23/2016  . Sepsis ([place]) 05/22/2016  . Malnutrition of moderate degree ([place]) 10/25/2014  . COPD exacerbation ([place]) 10/24/2014  . Tobacco abuse 10/24/2014  . Community acquired pneumonia 10/24/2014   PAST MEDICAL  HISTORY:  Active Ambulatory Problems    Diagnosis Date Noted  . COPD exacerbation ([place]) 10/24/2014  . Tobacco abuse 10/24/2014  . Community acquired pneumonia 10/24/2014  . Malnutrition of moderate degree ([place]) 10/25/2014  . Sepsis ([place]) 05/22/2016  . Protein-calorie malnutrition, severe 05/23/2016  . Acute on chronic respiratory failure with hypoxia ([place]) 02/23/2017  . Acute on chronic respiratory failure with hypoxia and hypercapnia ([place]) 06/29/2017  . Pressure injury of skin 10/23/2017  . Altered mental status   . Panic disorder 10/29/2017  . Pneumonia 09/17/2018  . Chronic respiratory failure requiring continuous mechanical ventilation through tracheostomy ([place]) 10/20/2018  . CAD (coronary artery disease) 10/20/2018  . HTN (hypertension) 10/20/2018  . Chronic diastolic CHF (congestive heart failure) ([place]) 10/20/2018  . DM (diabetes mellitus) ([place]) 06/24/2019  . Pseudomonas respiratory infection 07/03/2019  . Depression with anxiety 07/03/2019  . Hyperlipidemia associated with type 2 diabetes mellitus ([place]) 07/03/2019  . PEG (percutaneous endoscopic gastrostomy) status ([place]) 07/03/2019  . Chronic pain 07/03/2019  . Hypokalemia 07/03/2019  . HCAP (healthcare-associated pneumonia) 08/28/2019  . Atypical pneumonia 08/29/2019   Resolved Ambulatory Problems    Diagnosis Date Noted  . Acute respiratory failure with hypoxia ([place]) 10/24/2014  . Acute on chronic respiratory failure with hypercapnia ([place]) 11/29/2015  . Acute respiratory failure ([place]) 02/07/2016   Past Medical History:  Diagnosis Date  . Allergy   . Anxiety   . Asthma   . CHF (congestive heart failure) ([place])   . Cocaine abuse ([place])   .  COPD (chronic obstructive pulmonary disease) ([place])   . Coronary artery disease   . Emphysema/COPD ([place])   . Hepatitis C   . High cholesterol   . Hypertension   . Pneumothorax    SOCIAL HX:  Social History   Tobacco Use  . Smoking status: Former Smoker    Packs/day: 0.10     Years: 41.00    Pack years: 4.10    Types: Cigarettes    Quit date: 06/09/2017    Years since quitting: 3.1  . Smokeless tobacco: Never Used  Substance Use Topics  . Alcohol use: No   FAMILY HX:  Family History  Problem Relation Age of Onset  . Lung cancer Mother   . Asthma Mother   . CVA Father   . CAD Father   . Stroke Father   . Breast cancer Maternal Aunt 67  . COPD Sister       ALLERGIES:  Allergies  Allergen Reactions  . Amoxicillin     Patient has tolerated cephalosporins in the past  . Ativan [Lorazepam]     Makes anxiety worse      PERTINENT MEDICATIONS:  Outpatient Encounter Medications as of 08/04/2020  Medication Sig  . aspirin 81 MG chewable tablet Chew 81 mg by mouth daily.   . blood glucose meter kit and supplies KIT Dispense based on patient and insurance preference. Use up to four times daily as directed. (FOR ICD-9 250.00, 250.01).  . budesonide (PULMICORT) 0.25 MG/2ML nebulizer solution Take 2 mLs (0.25 mg total) by nebulization 2 (two) times daily. (Patient taking differently: Take 0.5 mg by nebulization 2 (two) times daily.)  . clonazePAM (KLONOPIN) 0.5 MG tablet Take 0.5 mg by mouth in the morning, at noon, and at bedtime.  . cloNIDine (CATAPRES - DOSED IN MG/24 HR) 0.1 mg/24hr patch Place 0.1 mg onto the skin once a week.  . fluticasone (FLONASE) 50 MCG/ACT nasal spray Place 1 spray into both nostrils daily.   Marland Kitchen gabapentin (NEURONTIN) 100 MG capsule Take 100 mg by mouth 3 (three) times daily. One tablet in am and mid day, and 3 at bedtime.  [facility] guaiFENesin (MUCINEX) 600 MG 12 hr tablet Take 600 mg by mouth 2 (two) times daily.  . hydrOXYzine (ATARAX/VISTARIL) 25 MG tablet Take 25 mg by mouth every 6 (six) hours as needed for anxiety.   . insulin detemir (LEVEMIR) 100 UNIT/ML FlexPen Inject 10 Units into the skin at bedtime.  . Insulin Pen Needle (PEN NEEDLES) 32G X 6 MM MISC 1 each by Does not apply route 4 (four) times daily - after meals and at  bedtime.  Marland Kitchen ipratropium-albuterol (DUONEB) 0.5-2.5 (3) MG/3ML SOLN Take 3 mLs by nebulization every 6 (six) hours. (Patient taking differently: Take 3 mLs by nebulization every 6 (six) hours as needed (for shortness of breath).)  . mirtazapine (REMERON SOL-TAB) 30 MG disintegrating tablet Take 1 tablet (30 mg total) by mouth at bedtime.  . predniSONE (DELTASONE) 10 MG tablet 50 mg daily x 2 days, then 40 mg daily x 2 days, then 30 mg daily x 2 days, then 20 mg daily x 2 days, then 10 mg daily x 2 days.   No facility-administered encounter medications on file as of 08/04/2020.    Objective: ROS  General: NAD EYES: denies vision changes, wears glasses ENMT: denies dysphagia Cardiovascular: denies chest pain Pulmonary: denies  cough, denies increased SOB Abdomen: endorses fair appetite, endorses constipation, endorses continence of bowel GU: denies dysuria, endorses continence of  urine MSK:  endorses ROM limitations, no falls reported Skin: denies rashes or wounds Neurological: endorses weakness, denies pain, denies insomnia Psych: Endorses positive mood Heme/lymph/immuno: denies bruises, abnormal bleeding  Physical Exam: Current and past weights: Constitutional: Vital signs, NAD General: frail appearing, thin/WNWD/obese  EYES: anicteric sclera, lids intact, no discharge  ENMT: intact hearing,oral mucous membranes moist, dentition intact CV: S1S2, RRR, no LE edema Pulmonary: LCTA, no increased work of breathing, no cough, no audible wheezes, room air Abdomen: intake 25-50%, normo-active BS +  4 quadrants, soft and non tender, no ascites GU: deferred MSK: mild sarcopenia, decreased ROM in all extremities, no contractures of LE, non ambulatory Skin: warm and dry, no rashes or wounds on visible skin Neuro: Generalized weakness, cognitive impairment Psych: non-anxious affect, A and O x 2 Hem/lymph/immuno: no widespread bruising   Thank you for the opportunity to participate in the  care of Ms. Murin.  The palliative care team will continue to follow. Please call our office at [phone] if we can be of additional assistance.  Jason Coop, NP , DNP, MPH, AGPCNP-BC, ACHPN   COVID-19 PATIENT SCREENING TOOL  Person answering questions: _______pt/family____________   1.  Is the patient or any family member in the home showing any signs or symptoms regarding respiratory infection?                  Person with Symptom  ______________na___________ a. Fever/chills/headache                                                        Yes___ No__X_            b. Shortness of breath                                                            Yes_x__ No___           c. Cough/congestion                                               Yes___  No__X_          d. Muscle/Body aches/pains                                                   Yes___ No__X_         e. Gastrointestinal symptoms (diarrhea,nausea)             Yes___ No__X_         f. Sudden loss of smell or taste      Yes___ No__X_        2. Within the past 10 days, has anyone living in the home had any contact with someone with or under investigation for COVID-19?    Yes___ No__X__   Person __________________

## 2020-09-07 ENCOUNTER — Ambulatory Visit: Payer: Medicaid Other

## 2020-09-29 ENCOUNTER — Telehealth: Payer: Self-pay | Admitting: Primary Care

## 2020-09-29 NOTE — Telephone Encounter (Signed)
T/c to daughter to check on patient and if visit was needed. Patient has been doing well, no need for several more weeks. Will call to schedule.

## 2020-10-06 IMAGING — DX DG CHEST 1V PORT
1 series · 1 of 1 positions shown · non-contrast
Comparison: Portable exam 3595 hours compared to 03/14/2019

CLINICAL DATA: Dyspnea, productive cough with yellow sputum,
altered mental status; past history of CHF, COPD, coronary artery
disease, hepatitis-C, hypertension

EXAM:
PORTABLE CHEST 1 VIEW

[chest ap]
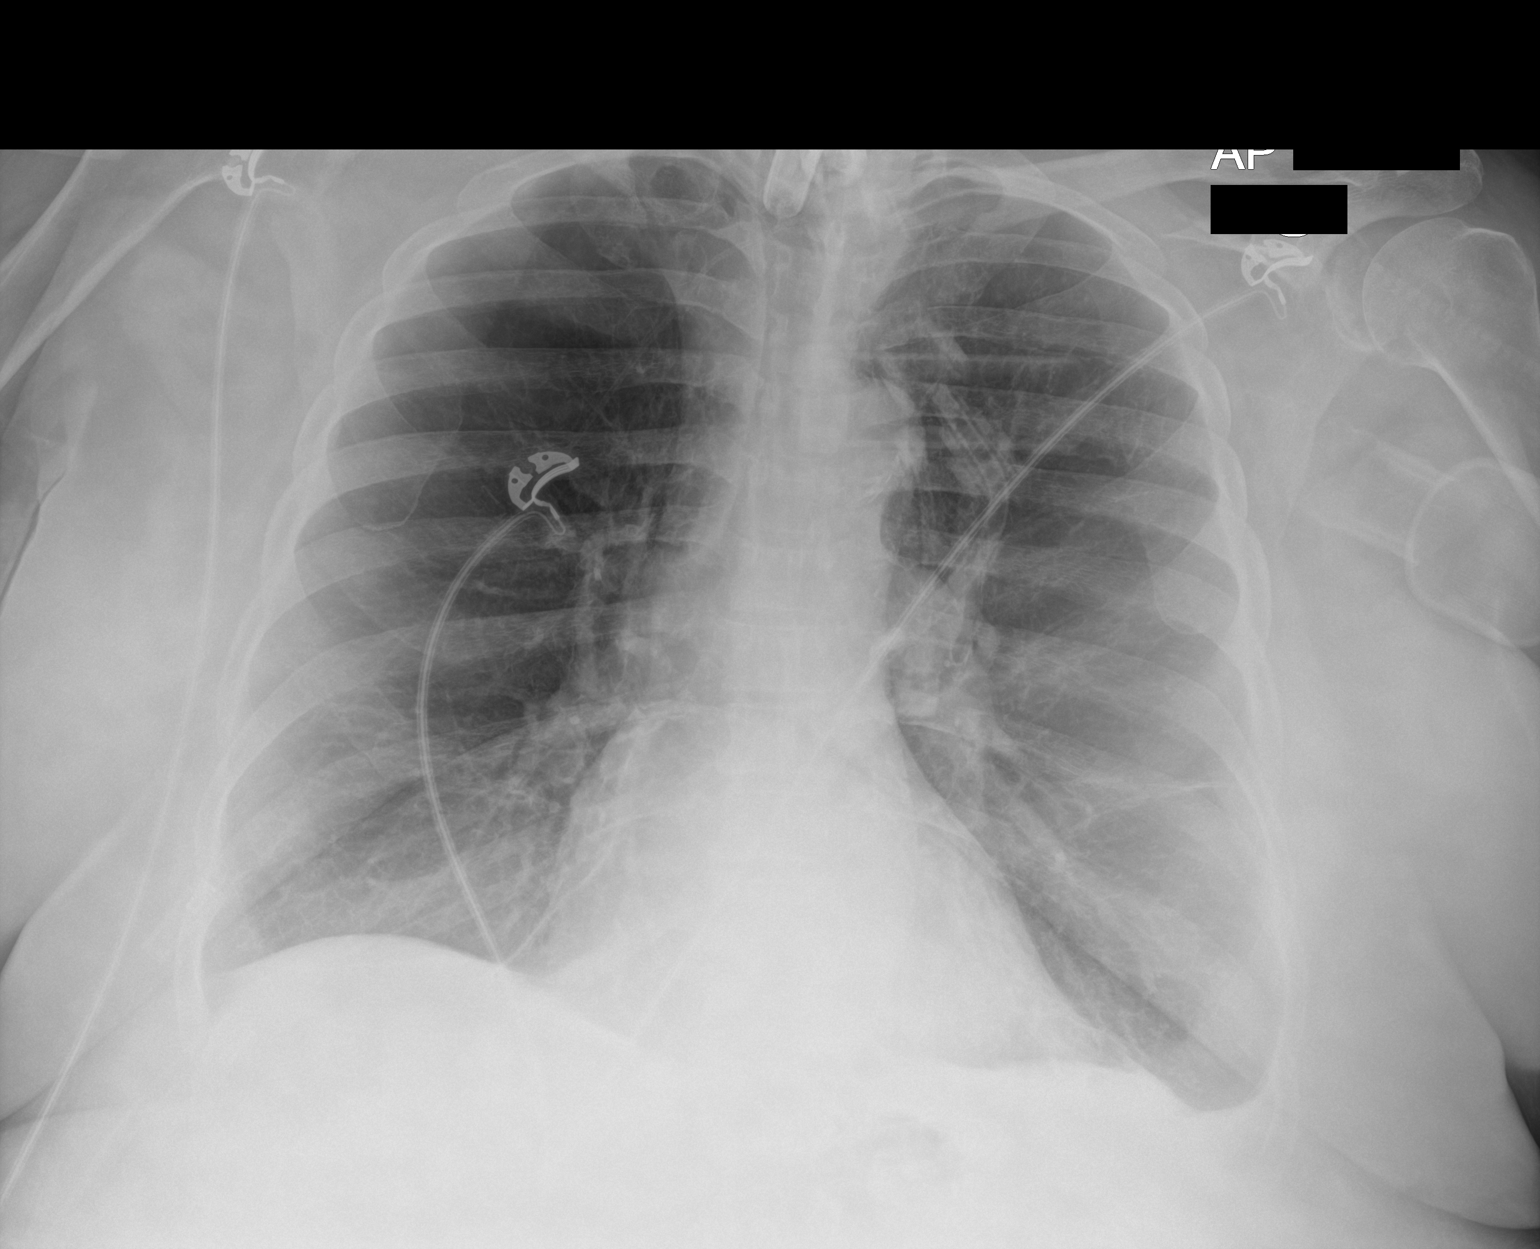

[1 of 1 positions shown; findings below may reference images not displayed]

FINDINGS: Tracheostomy tube stable.

Normal heart size, mediastinal contours, and pulmonary vascularity.

Emphysematous changes especially RIGHT upper lobe.

Minimal LEFT pleural effusion and lingular subsegmental atelectasis.

Remaining lungs clear.

No infiltrate or pneumothorax.

Osseous structures unremarkable.
IMPRESSION: COPD changes with minimal effusion and atelectasis at LEFT base.

## 2020-10-09 IMAGING — DX DG CHEST 1V PORT
1 series · 1 of 1 positions shown · non-contrast
Comparison: 06/24/2019

CLINICAL DATA: Respiratory failure

EXAM:
PORTABLE CHEST 1 VIEW

[chest ap]
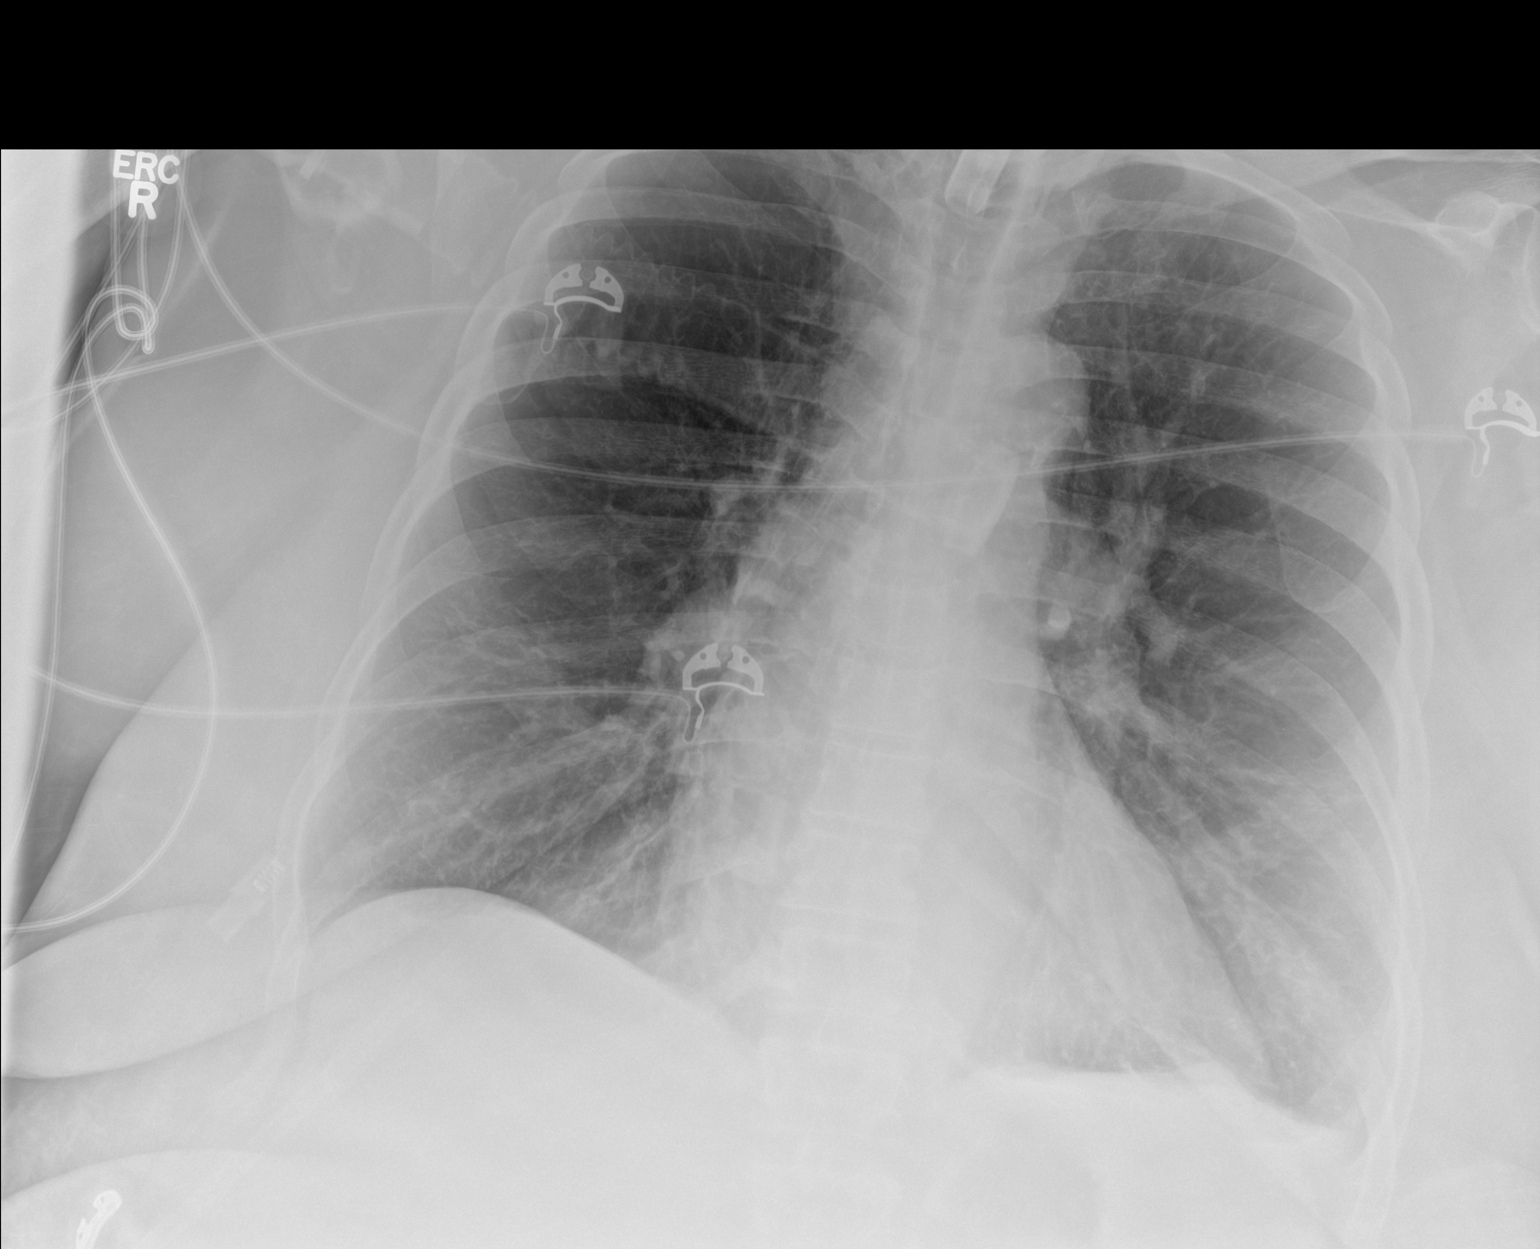

[1 of 1 positions shown; findings below may reference images not displayed]

FINDINGS: Tracheostomy tube tip is at the level of the clavicular heads.
Cardiomediastinal contours are normal. Lungs are clear. There is
chronic hyperinflation.
IMPRESSION: COPD without focal consolidation.

## 2020-11-15 ENCOUNTER — Encounter: Payer: Self-pay | Admitting: Dermatology

## 2020-11-15 ENCOUNTER — Other Ambulatory Visit: Payer: Self-pay

## 2020-11-15 ENCOUNTER — Other Ambulatory Visit: Payer: Medicaid Other | Admitting: Primary Care

## 2020-11-15 ENCOUNTER — Ambulatory Visit: Payer: Medicaid Other | Admitting: Dermatology

## 2020-11-15 DIAGNOSIS — Z515 Encounter for palliative care: Secondary | ICD-10-CM

## 2020-11-15 DIAGNOSIS — Z930 Tracheostomy status: Secondary | ICD-10-CM

## 2020-11-15 DIAGNOSIS — B379 Candidiasis, unspecified: Secondary | ICD-10-CM | POA: Diagnosis not present

## 2020-11-15 DIAGNOSIS — L609 Nail disorder, unspecified: Secondary | ICD-10-CM

## 2020-11-15 DIAGNOSIS — L02412 Cutaneous abscess of left axilla: Secondary | ICD-10-CM | POA: Diagnosis not present

## 2020-11-15 DIAGNOSIS — F418 Other specified anxiety disorders: Secondary | ICD-10-CM

## 2020-11-15 DIAGNOSIS — L02411 Cutaneous abscess of right axilla: Secondary | ICD-10-CM | POA: Diagnosis not present

## 2020-11-15 DIAGNOSIS — L0291 Cutaneous abscess, unspecified: Secondary | ICD-10-CM

## 2020-11-15 DIAGNOSIS — B3789 Other sites of candidiasis: Secondary | ICD-10-CM | POA: Insufficient documentation

## 2020-11-15 DIAGNOSIS — Z9911 Dependence on respirator [ventilator] status: Secondary | ICD-10-CM

## 2020-11-15 MED ORDER — FLUCONAZOLE 150 MG PO TABS: ORAL_TABLET | ORAL | 0 refills | Status: DC

## 2020-11-15 NOTE — Progress Notes (Signed)
[facility] Consult Note Telephone: [phone]  Fax: [phone]    Date of encounter: 11/15/20 PATIENT NAME: Michelle [name] [address] [name] [address]   [phone] (home)  DOB: April 05, 1961 MRN: [id number] PRIMARY CARE PROVIDER:    Alvester Chou, NP,  [address] [place] [address] [phone]  REFERRING PROVIDER:   Alvester Chou, NP [address] [place],  [place] [address] [phone]  RESPONSIBLE PARTY:    Contact Information     Name Relation Home Work Michelle [name], [name] Daughter [phone]  [phone]   Michelle, [name] Daughter [phone]  [phone]   Michelle, [name] Sister [phone]    Michelle [name] Significant other   [phone]   Michelle [name]   [phone]        I met face to face with patient and family in  home. Palliative Care was asked to follow this patient by consultation request of  Michelle Chou, NP to address advance care planning and complex medical decision making. This is a follow up visit.                                   ASSESSMENT AND PLAN / RECOMMENDATIONS:   Advance Care Planning/Goals of Care: Goals include to maximize quality of life and symptom management. Our advance care planning conversation included a discussion about:    Review of an  advance directive document - no changes  CODE STATUS: DNR, full scope, use abx, use IV, no feeding tube.   Symptom Management/Plan:  Patient home with recent I and D furuncles in axilla, bil. 5 total. Went to dermatology for procedure. Has doxycycline and diflucan from this visit with Dr. Laurence [name]. Pt c/o pain, advised limited OTC for pain management.  Pt has moved houses to her town location as they had been trying to for a year. Daughter Michelle [name] will get in touch with home vent providers to serve them in their new location. RT visits regularly to service machine, provide supplies and assess.  Pt very  positive and animated about this move. They have had multiple visits from family who now live nearby. She states it's easier for her to go in her w/c and she's been up and more active in the new apt.   C/o yeast infection under breasts and in groin, a red rash. Currently has been using nystatin but I will send in terbinifine 1%. She has diflucan 150 mg one now, one in 1 week per dermatology. I have also suggested to [name] to purchase interdry for all intertriginous areas.  Follow up Palliative Care Visit: Palliative care will continue to follow for complex medical decision making, advance care planning, and clarification of goals. Return 6-8 weeks or prn.  I spent 60 minutes providing this consultation. More than 50% of the time in this consultation was spent in counseling and care coordination.  PPS: 40%  HOSPICE ELIGIBILITY/DIAGNOSIS: TBD  Chief Complaint: skin infections  HISTORY OF PRESENT ILLNESS:  Michelle [name] is a 60 y.o. year old female  with newly I and d carbuncles bil axilla, candida of groin, COPD with NIV dependence .   History obtained from review of EMR, discussion with primary team, and interview with family, facility staff/caregiver and/or Michelle [name].  I reviewed available labs, medications, imaging, studies and related documents from the EMR.  Records reviewed and summarized above.  ROS  General: NAD ENMT: denies dysphagia Cardiovascular: denies chest pain, denies DOE Pulmonary: endorses prod cough, denies increased SOB Abdomen: endorses good appetite, denies constipation, endorses incontinence of bowel GU: denies dysuria, endorses incontinence of urine MSK:  endorses some weakness,  no falls reported Skin: bil I and D of axilla, light bleeding Neurological: endorses  axilla pain, denies insomnia Psych: Endorses positive mood Heme/lymph/immuno: denies bruises, abnormal bleeding  Physical Exam: Current and past weights: stable  Constitutional: NAD General: frail  appearing, obese  EYES: anicteric sclera, lids intact, no discharge  ENMT: intact hearing, oral mucous membranes moist  CV: no LE edema Pulmonary: no increased work of breathing, vent and trach  Abdomen: intake 100%,soft and non tender, no ascites GU: deferred MSK: no sarcopenia, moves all extremities, nonambulatory Skin: warm and dry, bil axilla wounds (5) Neuro:  + generalized weakness,  no cognitive impairment Psych: non-anxious affect, A and O x 3 Hem/lymph/immuno: no widespread bruising  Thank you for the opportunity to participate in the care of Michelle [name].  The palliative care team will continue to follow. Please call our office at [phone] if we can be of additional assistance.   Michelle Coop, NP , DNP, MPH, AGPCNP-BC, ACHPN  COVID-19 PATIENT SCREENING TOOL Asked and negative response unless otherwise noted:   Have you had symptoms of covid, tested positive or been in contact with someone with symptoms/positive test in the past 5-10 days?

## 2020-11-15 NOTE — Progress Notes (Deleted)
   New Patient Visit  Subjective  Michelle [name] is a 60 y.o. female who presents for the following: Skin Problem (New pt presents for extremely painful boils beneath her arms for 1 week, pt was prescribed Doxycycline tablets by her PCP, which has given her a yeast infection ) and Nail Problem (Infected fingernail on the right 5th finger ).    The following portions of the chart were reviewed this encounter and updated as appropriate:        Review of Systems:  No other skin or systemic complaints except as noted in HPI or Assessment and Plan.  Objective  Well appearing patient in no apparent distress; mood and affect are within normal limits.  A focused examination was performed including axillae, right 5th finger. Relevant physical exam findings are noted in the Assessment and Plan.    Assessment & Plan   No follow-ups on file.   I, Marye Round, CMA, am acting as scribe for Forest Gleason, MD .

## 2020-11-15 NOTE — Patient Instructions (Addendum)
Doxycycline should be taken with food to prevent nausea. Do not lay down for 30 minutes after taking. Be cautious with sun exposure and use good sun protection while on this medication. Pregnant women should not take this medication.     If you have any questions or concerns for your doctor, please call our main line at [phone] and press option 4 to reach your doctor's medical assistant. If no one answers, please leave a voicemail as directed and we will return your call as soon as possible. Messages left after 4 pm will be answered the following business day.   You may also send [place] a message via [facility]. We typically respond to MyChart messages within 1-2 business days.  For prescription refills, please ask your pharmacy to contact our office. Our fax number is [phone].  If you have an urgent issue when the clinic is closed that cannot wait until the next business day, you can page your doctor at the number below.    Please note that while we do our best to be available for urgent issues outside of office hours, we are not available 24/7.   If you have an urgent issue and are unable to reach [place], you may choose to seek medical care at your doctor's office, retail clinic, urgent care center, or emergency room.  If you have a medical emergency, please immediately call 911 or go to the emergency department.  Pager Numbers  - Dr. Nehemiah Massed: [phone]  - Dr. Laurence Ferrari: [phone]  - Dr. Nicole Kindred: [phone]  In the event of inclement weather, please call our main line at [phone] for an update on the status of any delays or closures.  Dermatology Medication Tips: Please keep the boxes that topical medications come in in order to help keep track of the instructions about where and how to use these. Pharmacies typically print the medication instructions only on the boxes and not directly on the medication tubes.   If your medication is too expensive, please contact our office at  [phone] option 4 or send [place] a message through [facility].   We are unable to tell what your co-pay for medications will be in advance as this is different depending on your insurance coverage. However, we may be able to find a substitute medication at lower cost or fill out paperwork to get insurance to cover a needed medication.   If a prior authorization is required to get your medication covered by your insurance company, please allow [place] 1-2 business days to complete this process.  Drug prices often vary depending on where the prescription is filled and some pharmacies may offer cheaper prices.  The website www.goodrx.com contains coupons for medications through different pharmacies. The prices here do not account for what the cost may be with help from insurance (it may be cheaper with your insurance), but the website can give you the price if you did not use any insurance.  - You can print the associated coupon and take it with your prescription to the pharmacy.  - You may also stop by our office during regular business hours and pick up a GoodRx coupon card.  - If you need your prescription sent electronically to a different pharmacy, notify our office through [facility] or by phone at [phone] option 4.

## 2020-11-15 NOTE — Progress Notes (Signed)
   New Patient Visit  Subjective  Michelle [name] is a 60 y.o. female who presents for the following: Skin Problem (New pt presents for extremely painful boils beneath her arms for 1 week, pt was prescribed Doxycycline tablets by her PCP, which has given her a yeast infection pt tried and failed otc yeast medicines ) and Nail Problem (Infected fingernail on the right 5th finger ).  Daughter and caregiver with patient   The following portions of the chart were reviewed this encounter and updated as appropriate:   Tobacco  Allergies  Meds  Problems  Med Hx  Surg Hx  Fam Hx      Review of Systems:  No other skin or systemic complaints except as noted in HPI or Assessment and Plan.  Objective  Well appearing patient in no apparent distress; mood and affect are within normal limits.  A focused examination was performed including axillae, right 5th finger. Relevant physical exam findings are noted in the Assessment and Plan.  right 5th finger Dystrophic nail with subungual debris  axillae Multiple large fluctuant subcutaneous nodules at bilateral axillae (3 at right, 2 at left) with surrounding firm plaques  groin No visual exam today    Assessment & Plan  Nail problem right 5th finger  Likely onychomycosis. Will plan to do clipping for culture and start topical (ciclopirox) at follow-up.   Abscess axillae  New in last week No history of similar issue  Bacterial C&S pending 1 taken at right axilla, 1 at left  Cont Doxycyline as directed (just started yesterday) May consider changing to Bactrim DS if not better tomorrow, pt will call here to let us know   Intralesional injection - axillae Location: axillae  Informed Consent: Discussed risks (permanent scarring, light or dark discoloration, infection, pain, bleeding, bruising, redness, damage to adjacent structures, and recurrence of the lesion) and benefits of the procedure, as well as the alternatives.  Informed  consent was obtained.  Preparation: The area was prepped with alcohol.  Anesthesia: Lidocaine 1% with epinephrine  Procedure Details: An incision was made overlying the lesions. The lesions drained copious pus. Due to their large size, 4 of the cavities where packed with iodiform gauze. One of these was multi-located. Antibiotic ointment and a sterile pressure dressing were applied. The patient tolerated procedure well.  Total number of lesions drained: 5  Plan: The patient was instructed on post-op care. Recommend OTC analgesia as needed for pain.   Related Procedures Anaerobic and Aerobic Culture Anaerobic and Aerobic Culture  Yeast infection groin  New onset itch in setting of antibiotic use. She has failed OTC miconazole. Start Diflucan 150 mg take 1 tablet day 1 repeat in 1 week if needed #2 0RF. Reviewed rare risk of liver irritation with PO fluconazole.   Related Medications fluconazole (DIFLUCAN) 150 MG tablet Take 1 tablet day 1 repeat in 1 week  Return in about 2 weeks (around 11/29/2020) for recheck abcess .   I, Marye Round, CMA, am acting as scribe for Forest Gleason, MD .   Documentation: I have reviewed the above documentation for accuracy and completeness, and I agree with the above.  Forest Gleason, MD

## 2020-11-16 ENCOUNTER — Telehealth: Payer: Self-pay

## 2020-11-16 NOTE — Telephone Encounter (Signed)
11/16/20 @1120  AM: Palliative care SW outreached patients daughter to discuss medicare enrolment, per [facility] NP - K. Smith request.   Daughter shared that she was informed by [facility] that beneficiaries are no longer automatically enrolled into Medicare benefits after the 24 month approval period of disability. SW advised daughter to call 1-800 MEDICARE to enroll patient via phone or enroll online. Daughter stated understanding.   Daughter had no other questions/concerns at this time.

## 2020-11-18 ENCOUNTER — Telehealth: Payer: Self-pay

## 2020-11-18 IMAGING — DX DG CHEST 1V PORT
2 series · 2 of 2 positions shown · non-contrast
Comparison: 06/27/2019

CLINICAL DATA: Respiratory failure and fevers

EXAM:
PORTABLE CHEST 1 VIEW

[chest ap (1 of 2)]
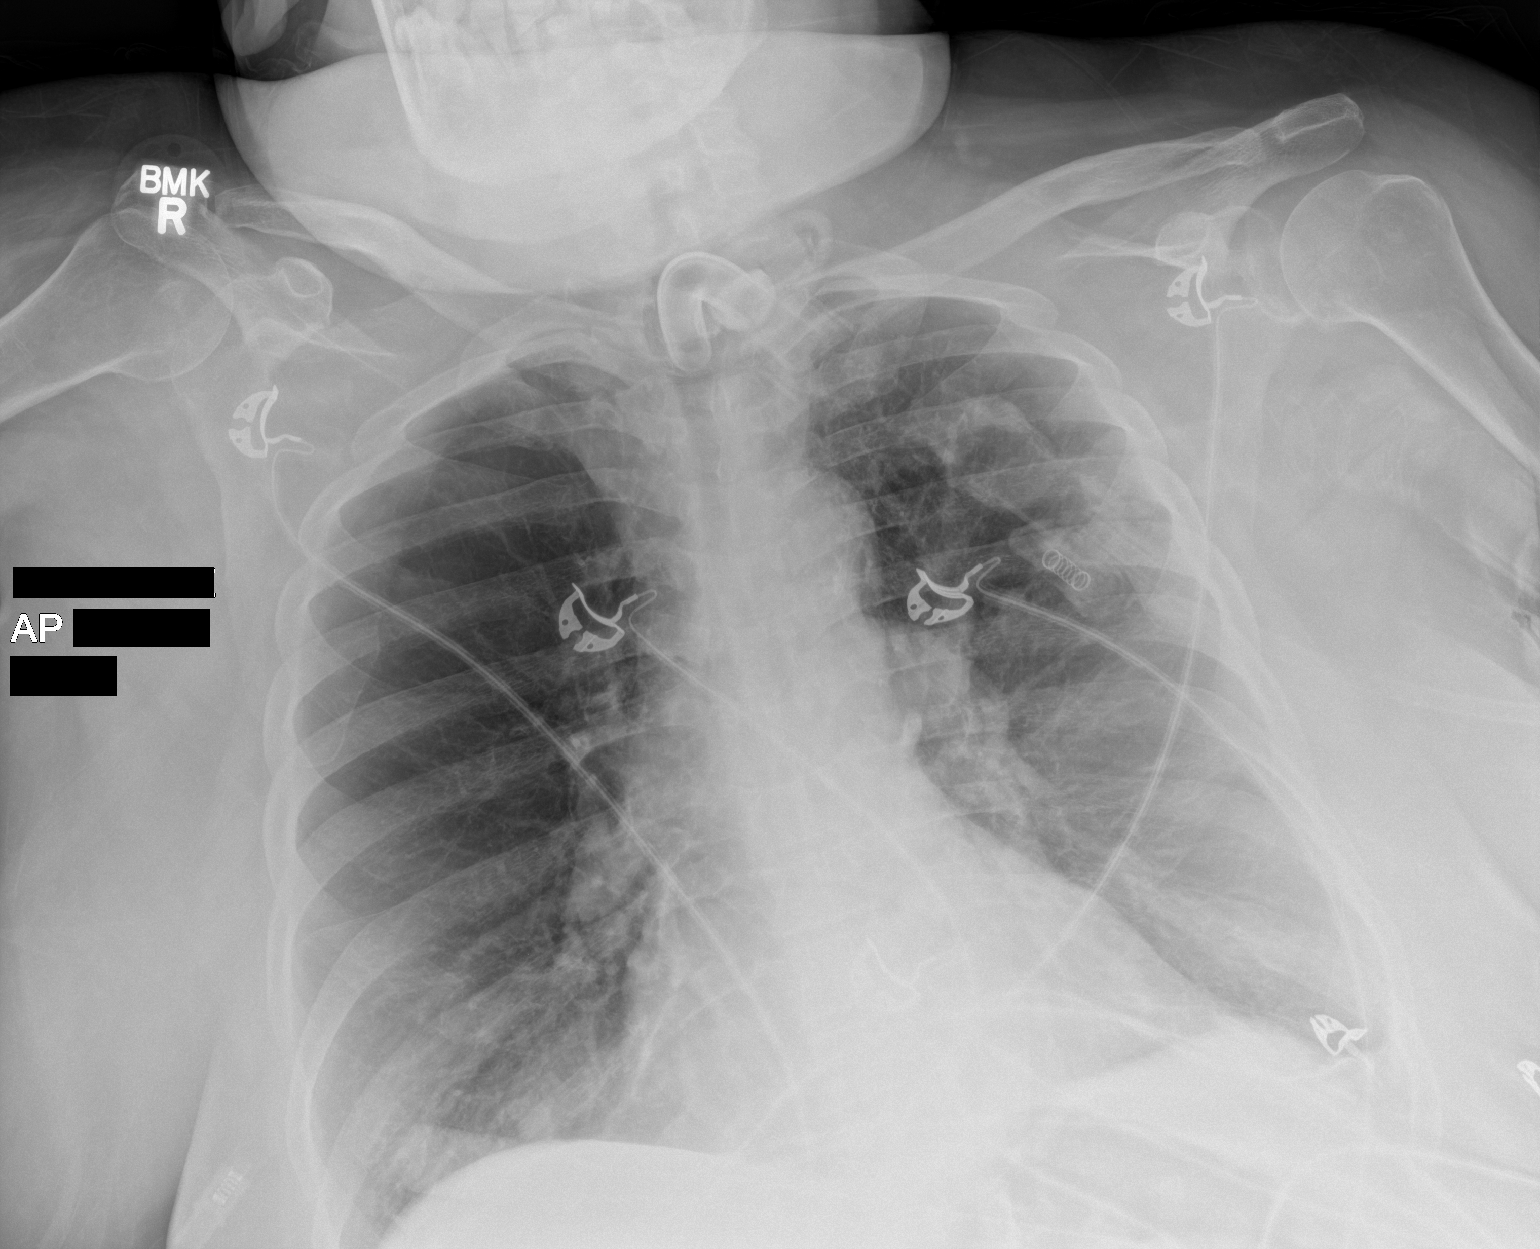

[chest ap (2 of 2)]
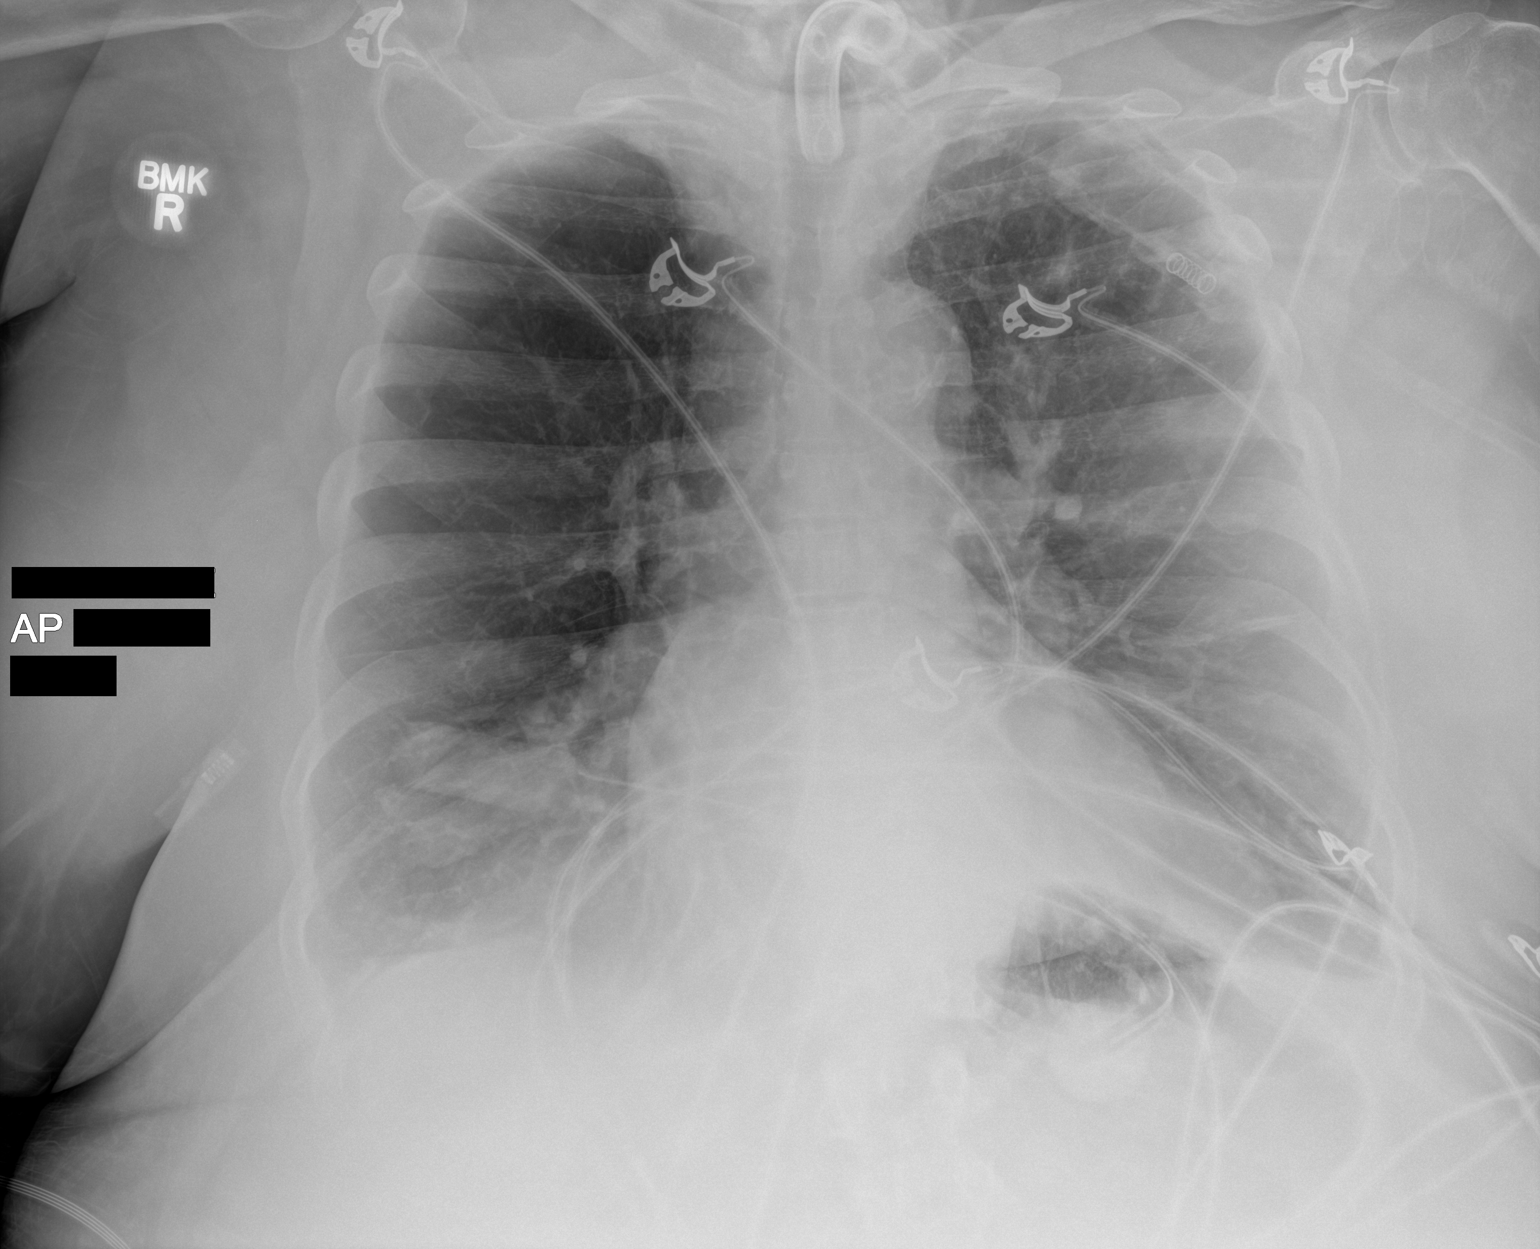

[2 of 2 positions shown; findings below may reference images not displayed]

FINDINGS: Tracheostomy tube is again seen in satisfactory position. Lungs are
well aerated bilaterally. No focal infiltrate or sizable effusion is
seen. No bony abnormality is noted. Cardiac shadow is within normal
limits. No bony abnormality is noted.
IMPRESSION: No active disease.

## 2020-11-18 NOTE — Telephone Encounter (Signed)
907 am.  Phone call made to patient to schedule an appointment for blood work ordered by PCP.  No answer and unable to leave a VM as this is currently full.  RN will attempt to reach patient at a later time to schedule appointment.

## 2020-11-19 IMAGING — DX DG ABDOMEN 1V
3 series · 3 of 3 positions shown · non-contrast
Comparison: None.

CLINICAL DATA: Abdominal distension

EXAM:
ABDOMEN - 1 VIEW

[abdomen supine (1 of 3)]
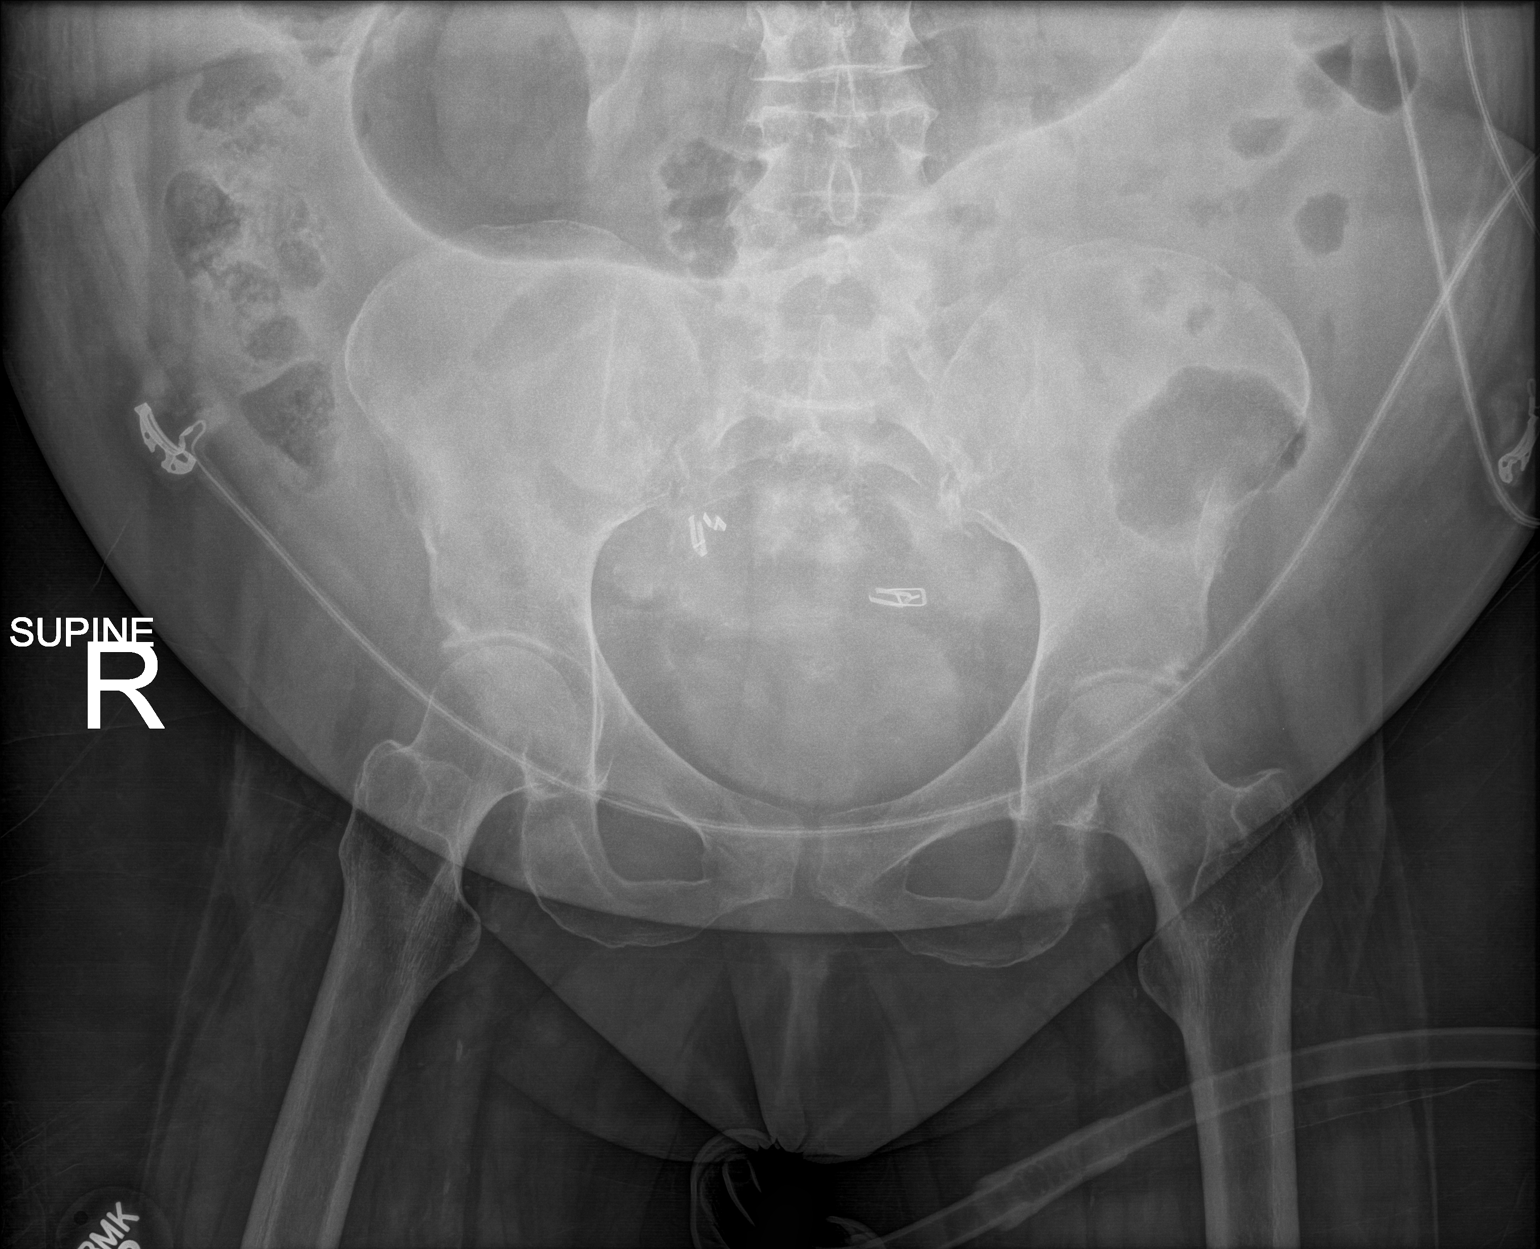

[abdomen supine (2 of 3)]
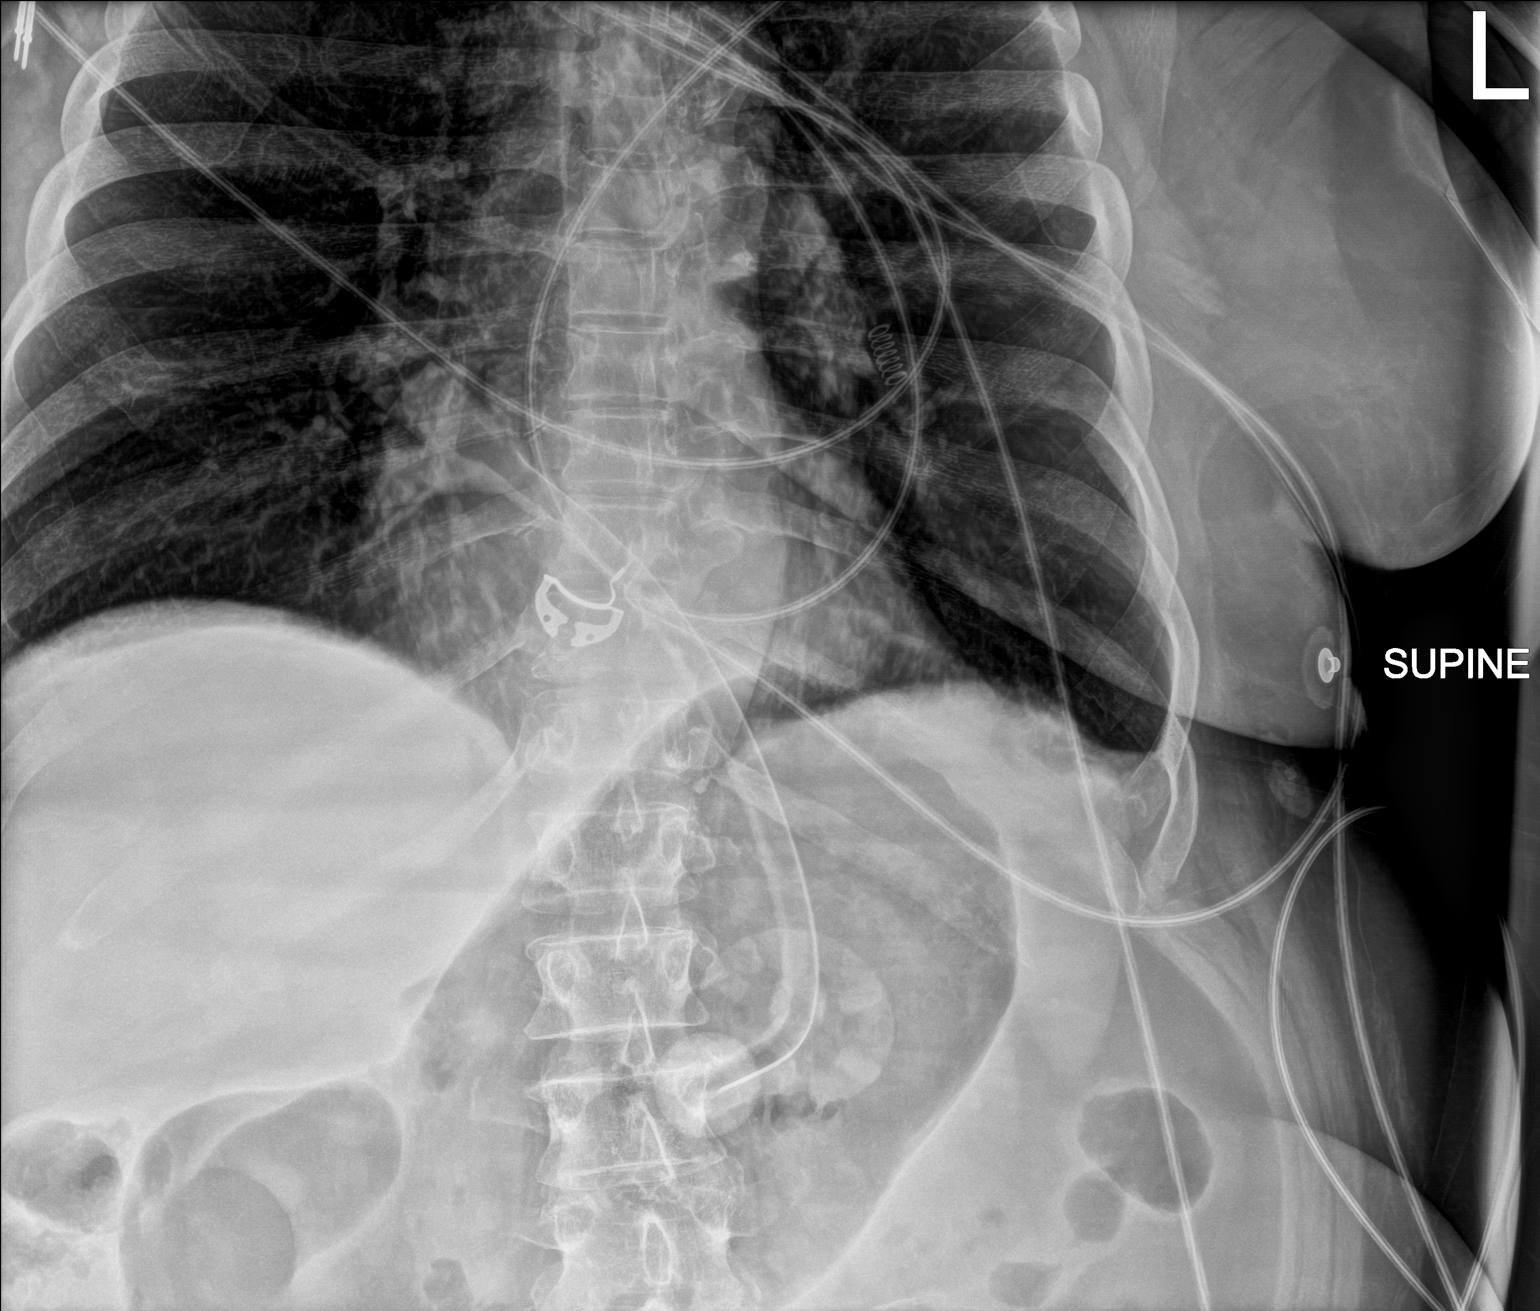

[abdomen supine (3 of 3)]
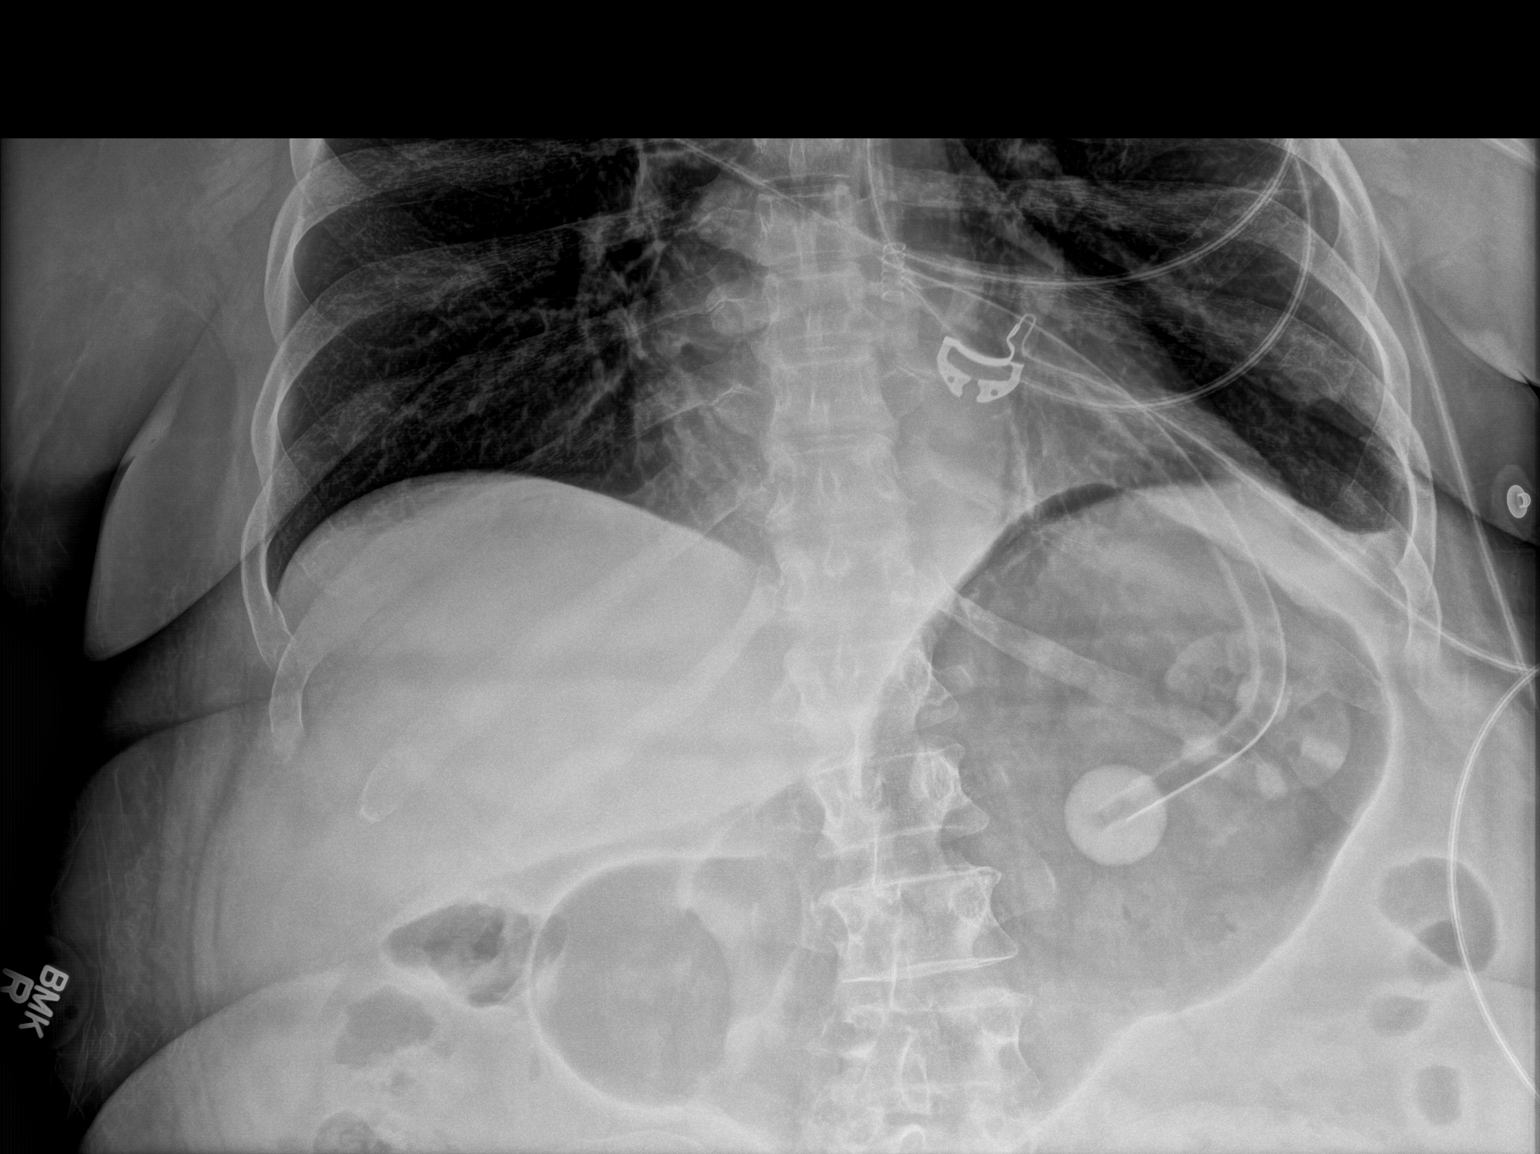

[3 of 3 positions shown; findings below may reference images not displayed]

FINDINGS: Scattered large and small bowel gas is noted. Gastrostomy catheter
is noted within the stomach. The stomach is well distended with air.
No free air is seen. Changes of prior tubal ligation are noted. No
acute bony abnormality is seen.
IMPRESSION: No acute abnormality noted.

## 2020-11-20 LAB — ANAEROBIC AND AEROBIC CULTURE

## 2020-11-21 ENCOUNTER — Telehealth: Payer: Self-pay

## 2020-11-21 NOTE — Telephone Encounter (Signed)
1057 am.  Phone call made to daughter to schedule a home visit for blood work to be obtained as ordered by PCP.  No answer and unable to leave a message.

## 2020-11-22 ENCOUNTER — Telehealth: Payer: Self-pay

## 2020-11-22 NOTE — Telephone Encounter (Signed)
-----   Message from [place], MD sent at 11/21/2020 10:33 PM EDT ----- Patient's culture grew both MRSA sensitive to doxycycline and also Group B beta hemolytic streptococcus, another type of bacteria. If she is doing well, we do not need to use an additional antibiotic. However, if she is still having pain or problems with this area please let me know as we may need to consider another antibiotic.  MAs please call. Thank you!

## 2020-11-23 ENCOUNTER — Telehealth: Payer: Self-pay

## 2020-11-23 NOTE — Telephone Encounter (Signed)
No answer, VM full, JS

## 2020-11-23 NOTE — Telephone Encounter (Signed)
-----   Message from [place], MD sent at 11/21/2020 10:33 PM EDT ----- Patient's culture grew both MRSA sensitive to doxycycline and also Group B beta hemolytic streptococcus, another type of bacteria. If she is doing well, we do not need to use an additional antibiotic. However, if she is still having pain or problems with this area please let me know as we may need to consider another antibiotic.  MAs please call. Thank you!

## 2020-11-24 ENCOUNTER — Other Ambulatory Visit: Payer: Medicaid Other

## 2020-11-24 ENCOUNTER — Other Ambulatory Visit: Payer: Self-pay

## 2020-11-24 ENCOUNTER — Telehealth: Payer: Self-pay

## 2020-11-24 DIAGNOSIS — Z515 Encounter for palliative care: Secondary | ICD-10-CM

## 2020-11-24 NOTE — Telephone Encounter (Signed)
1005 am.  Incoming call from [name] stating she missed a call from my number.  Advised that I am with Palliative Care and have been trying to reach her to schedule an appointment for blood work to be obtained per PCP.  Visit scheduled for today at 1 pm.  Advised daughter that dermatology has also been trying to reach patient and requested she contact them.  Daughter voiced appreciation for call and will follow up with dermatology.

## 2020-11-24 NOTE — Progress Notes (Signed)
1 pm.  Arrived at patient's home to obtain blood ordered by PCP. Patient found in the bed awake.  Introductions made and advised of ordered blood work from PCP.  Patient is agreeable for blood draw. Attempted to access right and left AC without success.  Assessed bilateral arms for another access but no success.  Advised that I would notify Palliative Care NP that blood work was not obtained as no there was no access after 2 attempts.  230 pm.  Email communication sent to Ralene Bathe, NP advising of above.

## 2020-11-24 NOTE — Telephone Encounter (Signed)
919 am.  Phone call made to patient/daughter to schedule a visit.  No answer and unable to leave VM as mailbox is full.  Notified Ralene Bathe, NP of 3 attempts to reach patient without success.

## 2020-11-25 ENCOUNTER — Telehealth: Payer: Self-pay

## 2020-11-25 NOTE — Telephone Encounter (Signed)
10 am.  Phone call made to PCP office to advise of inability to obtain labs as request.ed.  PCP office is currently closed but message has been left with this information.

## 2020-12-01 ENCOUNTER — Other Ambulatory Visit: Payer: Self-pay

## 2020-12-01 ENCOUNTER — Ambulatory Visit (INDEPENDENT_AMBULATORY_CARE_PROVIDER_SITE_OTHER): Payer: Medicaid Other | Admitting: Dermatology

## 2020-12-01 DIAGNOSIS — L0291 Cutaneous abscess, unspecified: Secondary | ICD-10-CM

## 2020-12-01 DIAGNOSIS — L02411 Cutaneous abscess of right axilla: Secondary | ICD-10-CM

## 2020-12-01 MED ORDER — MUPIROCIN 2 % EX OINT: 1.0000 "application " | TOPICAL_OINTMENT | Freq: Every day | CUTANEOUS | 1 refills | Status: DC

## 2020-12-01 MED ORDER — DOXYCYCLINE MONOHYDRATE 100 MG PO CAPS: 100.0000 mg | ORAL_CAPSULE | Freq: Two times a day (BID) | ORAL | 0 refills | Status: AC

## 2020-12-01 NOTE — Patient Instructions (Addendum)
Hibiclens wash to affected areas of underarms      If you have any questions or concerns for your doctor, please call our main line at [phone] and press option 4 to reach your doctor's medical assistant. If no one answers, please leave a voicemail as directed and we will return your call as soon as possible. Messages left after 4 pm will be answered the following business day.   You may also send [place] a message via [place]. We typically respond to MyChart messages within 1-2 business days.  For prescription refills, please ask your pharmacy to contact our office. Our fax number is [phone].  If you have an urgent issue when the clinic is closed that cannot wait until the next business day, you can page your doctor at the number below.    Please note that while we do our best to be available for urgent issues outside of office hours, we are not available 24/7.   If you have an urgent issue and are unable to reach [place], you may choose to seek medical care at your doctor's office, retail clinic, urgent care center, or emergency room.  If you have a medical emergency, please immediately call 911 or go to the emergency department.  Pager Numbers  - Dr. Nehemiah Massed: [phone]  - Dr. Laurence Ferrari: [phone]  - Dr. Nicole Kindred: [phone]  In the event of inclement weather, please call our main line at [phone] for an update on the status of any delays or closures.  Dermatology Medication Tips: Please keep the boxes that topical medications come in in order to help keep track of the instructions about where and how to use these. Pharmacies typically print the medication instructions only on the boxes and not directly on the medication tubes.   If your medication is too expensive, please contact our office at [phone] option 4 or send [place] a message through [place].   We are unable to tell what your co-pay for medications will be in advance as this is different depending on your  insurance coverage. However, we may be able to find a substitute medication at lower cost or fill out paperwork to get insurance to cover a needed medication.   If a prior authorization is required to get your medication covered by your insurance company, please allow [place] 1-2 business days to complete this process.  Drug prices often vary depending on where the prescription is filled and some pharmacies may offer cheaper prices.  The website www.goodrx.com contains coupons for medications through different pharmacies. The prices here do not account for what the cost may be with help from insurance (it may be cheaper with your insurance), but the website can give you the price if you did not use any insurance.  - You can print the associated coupon and take it with your prescription to the pharmacy.  - You may also stop by our office during regular business hours and pick up a GoodRx coupon card.  - If you need your prescription sent electronically to a different pharmacy, notify our office through [facility] or by phone at [phone] option 4.

## 2020-12-01 NOTE — Progress Notes (Signed)
   Follow-Up Visit   Subjective  Michelle [name] is a 60 y.o. female who presents for the following: Follow-up (Patient is still in pain, improved, and still having drainage at one of ulcers under arm. Patient was given prescription of bactrim previously. She had taken a few days of doxycycline before her last appointment. Daughter states the packing came out of one of the cyst, and they have been applying bandaids.).  The following portions of the chart were reviewed this encounter and updated as appropriate:  Tobacco  Allergies  Meds  Problems  Med Hx  Surg Hx  Fam Hx       Review of Systems: No other skin or systemic complaints.  Objective  Well appearing patient in no apparent distress; mood and affect are within normal limits.  A focused examination was performed including axillae. Relevant physical exam findings are noted in the Assessment and Plan.   Assessment & Plan  Abscess Right Axilla  Reviewed previous culture results showing MRSA and Strep  I&D at left axilla Culture taken at right axilla  Start mupirocin ointment and cover with bandaid daily Apply mupirocin thin layer to nares twice a day for 10 days Start chlorhexidine surgical scrub to wash axillae daily  Restart doxycyline 100 mg capsule by mouth twice daily for 1 week  Incision and Drainage - Right Axilla Location: left axilla   Informed Consent: Discussed risks (permanent scarring, light or dark discoloration, infection, pain, bleeding, bruising, redness, damage to adjacent structures, and recurrence of the lesion) and benefits of the procedure, as well as the alternatives.  Informed consent was obtained.  Preparation: The area was prepped with alcohol.  Anesthesia: Lidocaine 1% with epinephrine.   Procedure Details: An incision was made overlying the lesion. The lesion drained pus.  A small amount of fluid was drained.  Iodoform packing was inserted into abscess. Antibiotic ointment and a sterile  pressure dressing were applied. The patient tolerated procedure well.  Total number of lesions drained: 1  Plan: The patient was instructed on post-op care. Recommend OTC analgesia as needed for pain.   doxycycline (MONODOX) 100 MG capsule - Right Axilla Take 1 capsule (100 mg total) by mouth 2 (two) times daily for 7 days. Take with food  Related Procedures Anaerobic/Aerobic/Gram Stain  Related Medications mupirocin ointment (BACTROBAN) 2 % Apply 1 application topically daily to affected area at underarm and twice daily to inside of nose for 10 days  Return if symptoms worsen or fail to improve.  I, Ruthell Rummage, CMA, scribed for Alfonso Patten, MD.  Documentation: I have reviewed the above documentation for accuracy and completeness, and I agree with the above.  Forest Gleason, MD

## 2020-12-09 LAB — ANAEROBIC/AEROBIC/GRAM STAIN

## 2020-12-10 IMAGING — CT CT ABD-PELV W/ CM
2 of 5 series · 16 of 46 positions shown, 18 images · IV contrast (APPLIED)
Comparison: Abdominal radiograph 08/07/2019

CLINICAL DATA: Nausea and vomiting. Left-sided flank and abdominal
pain.

EXAM:
CT ABDOMEN AND PELVIS WITH CONTRAST
TECHNIQUE: Multidetector CT imaging of the abdomen and pelvis was performed
using the standard protocol following bolus administration of
intravenous contrast.
CONTRAST:  100mL OMNIPAQUE IOHEXOL 300 MG/ML  SOLN

[Series 3: routine abd/pel with · axial · 0.86mm/px · z∈[-336,+29]mm · 13 of 83 slices shown, 15 images]
[im 5/83  soft-tissue]
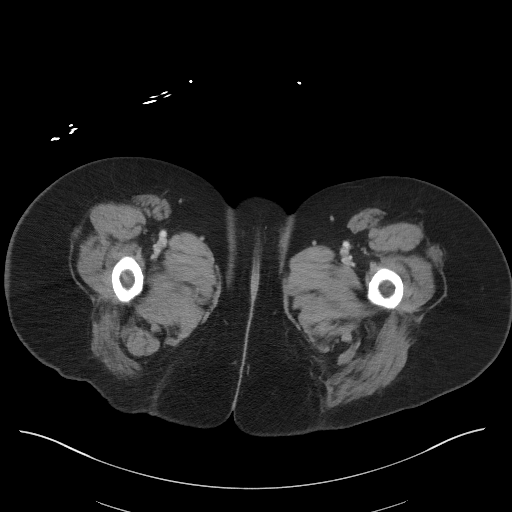
[im 5/83  bone]
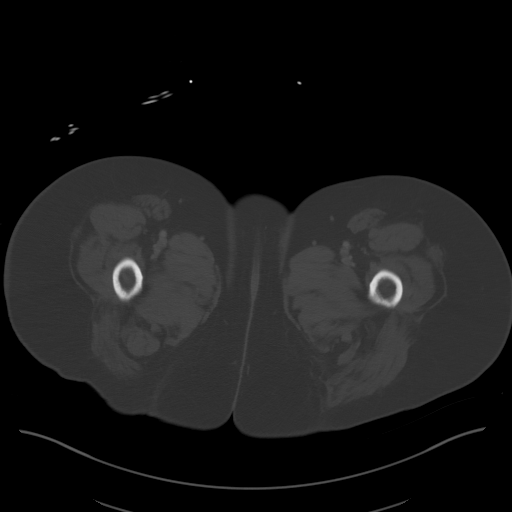
[im 10/83  soft-tissue]
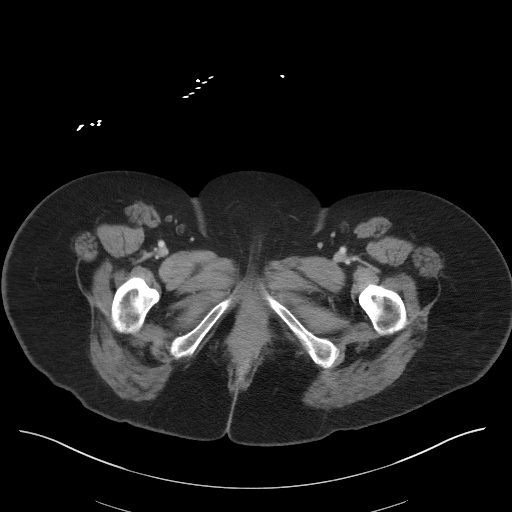
[im 20/83  soft-tissue]
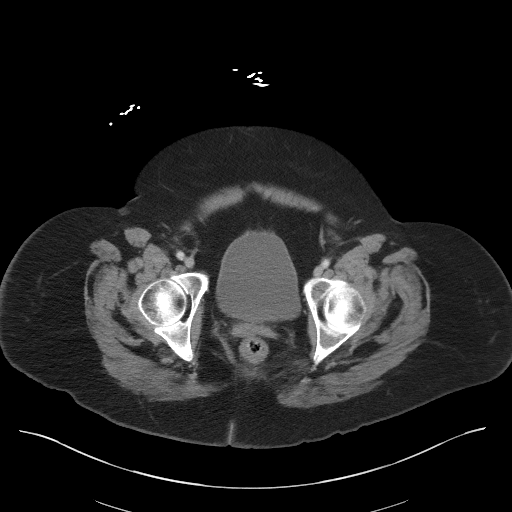
[im 25/83  soft-tissue]
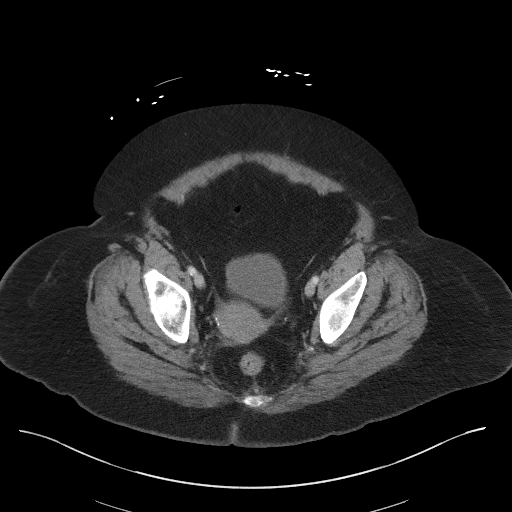
[im 29/83  soft-tissue]
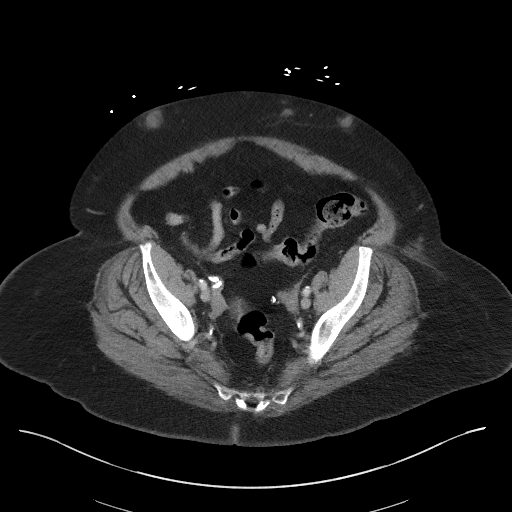
[im 34/83  soft-tissue]
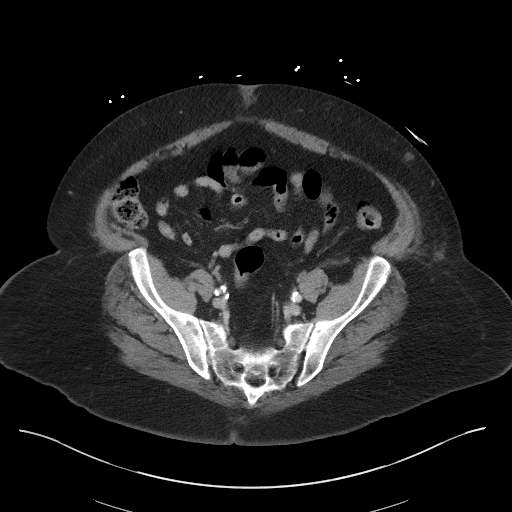
[im 44/83  soft-tissue]
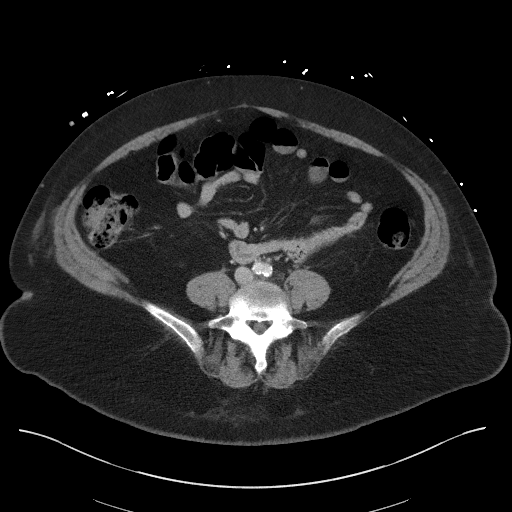
[im 49/83  soft-tissue]
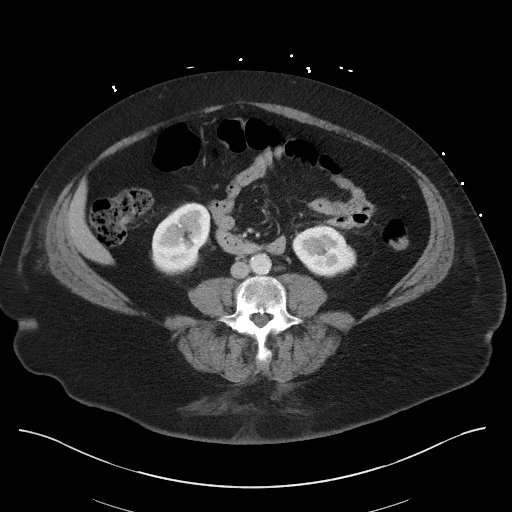
[im 54/83  soft-tissue]
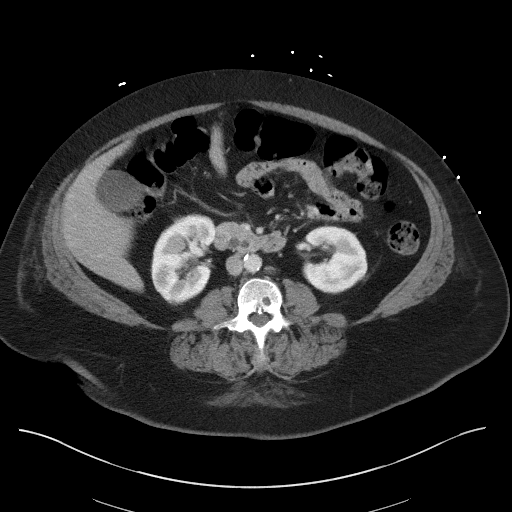
[im 54/83  bone]
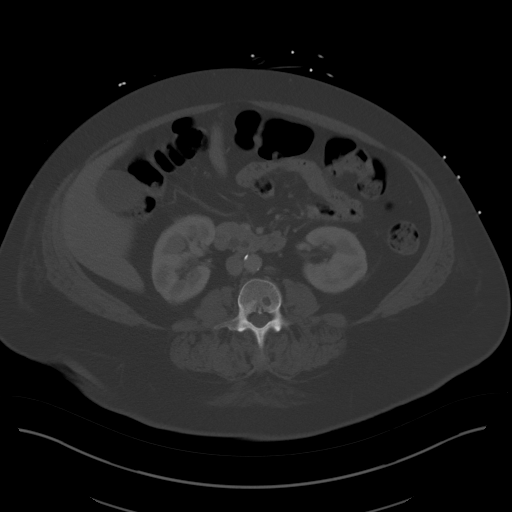
[im 58/83  soft-tissue]
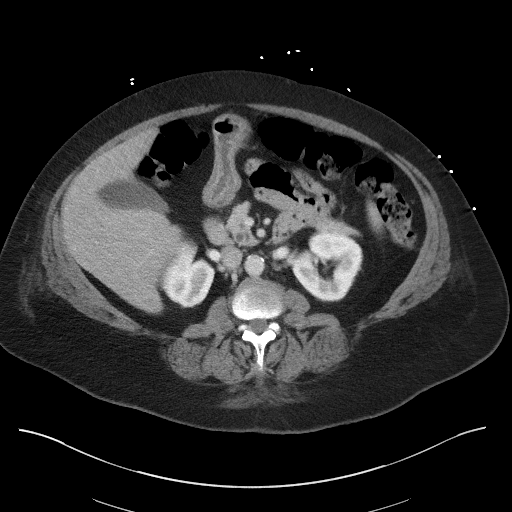
[im 63/83  soft-tissue]
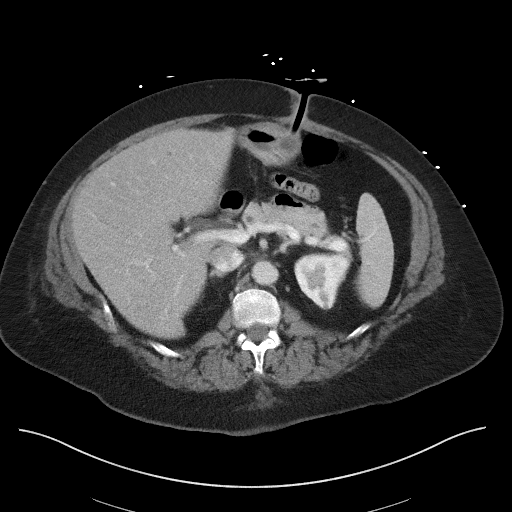
[im 73/83  soft-tissue]
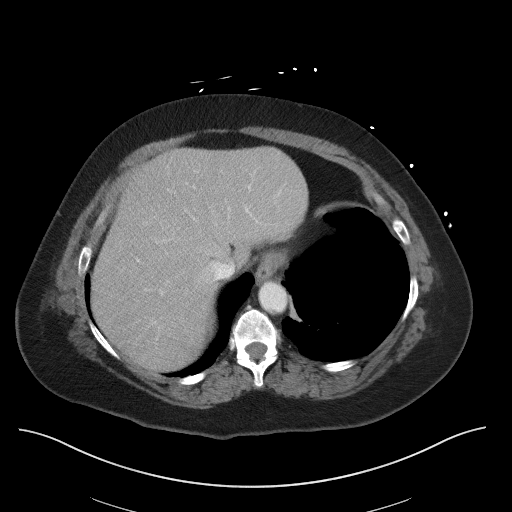
[im 78/83  soft-tissue]
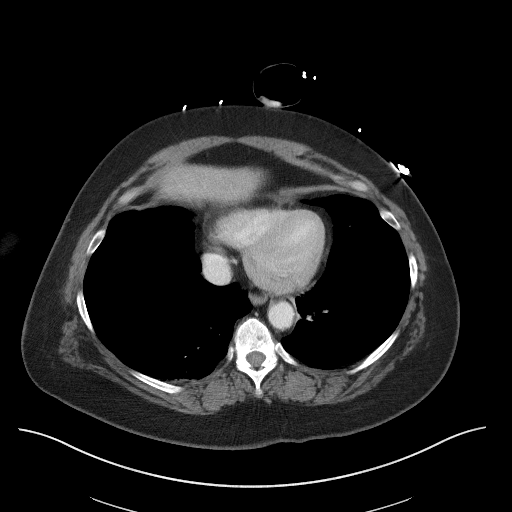

[Series 6: coronal st · coronal · 0.87mm/px · 3 of 100 slices shown]
[im 34/100  soft-tissue]
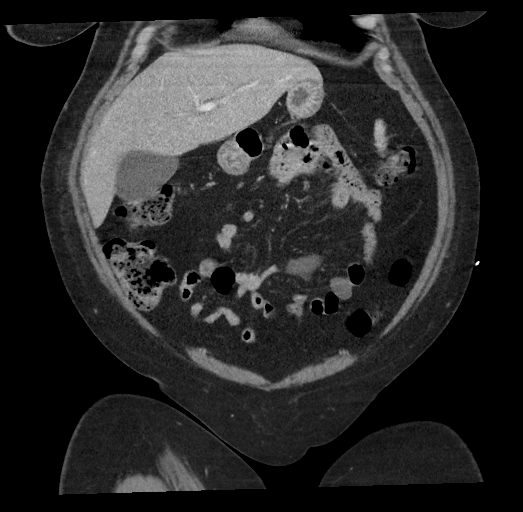
[im 45/100  soft-tissue]
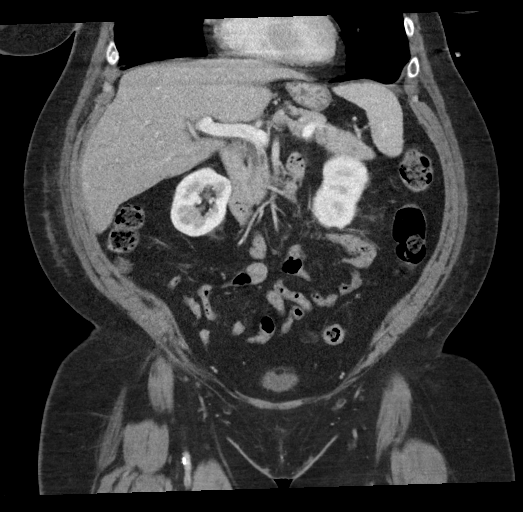
[im 56/100  soft-tissue]
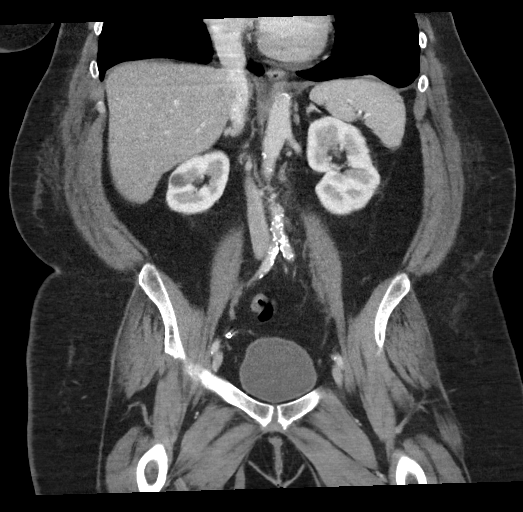

[16 of 46 positions shown; findings below may reference images not displayed]

FINDINGS: Lower chest: Emphysema. Linear opacity in the right lower lobe
favors atelectasis. Heart is normal in size.

Hepatobiliary: Decreased hepatic density consistent with steatosis.
More focal fatty infiltration adjacent to the falciform ligament. No
discrete focal hepatic lesion. Gallbladder physiologically
distended. No calcified gallstone or pericholecystic inflammation.
There is mild extrahepatic biliary ductal dilatation with common
bile duct measuring 8 mm distally.

Pancreas: No acute peripancreatic inflammation. There is proximal
pancreatic ductal dilatation at 6 mm. No evidence of pancreatic cyst
or focal lesion.

Spleen: Normal in size without focal abnormality.

Adrenals/Urinary Tract: No adrenal nodule. No hydronephrosis or
perinephric edema. Homogeneous renal enhancement with symmetric
excretion on delayed phase imaging. 16 mm cyst in the mid right
kidney. Additional tiny hypodensity in the lower right kidney is
incompletely characterized due to size. Urinary bladder is
physiologically distended without wall thickening.

Stomach/Bowel: Gastrostomy tube appropriately positioned in the
stomach. No abnormality along the gastrostomy tube tract. Stomach is
decompressed and unremarkable. No small bowel inflammation or
obstruction. Normal appendix. Small to moderate colonic stool
burden. No colonic wall thickening or inflammation. No significant
diverticular disease.

Vascular/Lymphatic: Aorto bi-iliac atherosclerosis, moderate but
advanced for age. No aortic aneurysm. The portal vein is patent. No
acute vascular findings. No abdominopelvic adenopathy.

Reproductive: Bilateral tubal ligation clips. Uterus unremarkable.
Ovaries quiescent. No adnexal mass.

Other: No free air, free fluid, or intra-abdominal fluid collection.
Small fat containing umbilical hernia. Soft tissue densities in the
lower anterior abdominal wall typical of medication injection sites.

Musculoskeletal: There are no acute or suspicious osseous
abnormalities. Degenerative disc disease at L5-S1.
IMPRESSION: 1. Mild extrahepatic biliary ductal dilatation and proximal
pancreatic ductal dilatation, of uncertain etiology and chronicity.
No significant gallbladder dilatation or evidence of focal
pancreatic or obstructing lesion. Recommend correlation with LFTs.
If elevated, recommend further evaluation with MRCP or ERCP. MRCP
should only be considered if patient is able to tolerate breath hold
technique, and could be performed on an elective outpatient basis.
2. No additional acute abnormality in the abdomen/pelvis.
3. Hepatic steatosis.
4. Aorto bi-iliac atherosclerosis, age advanced.
5. Gastrostomy tube appropriately positioned in the stomach.

Aortic Atherosclerosis ([id number]-KT5.5).

## 2020-12-10 IMAGING — DX DG CHEST 1V PORT
1 series · 1 of 1 positions shown · non-contrast
Comparison: August 06, 2019

CLINICAL DATA: Difficulty breathing.

EXAM:
PORTABLE CHEST 1 VIEW

[chest ap]
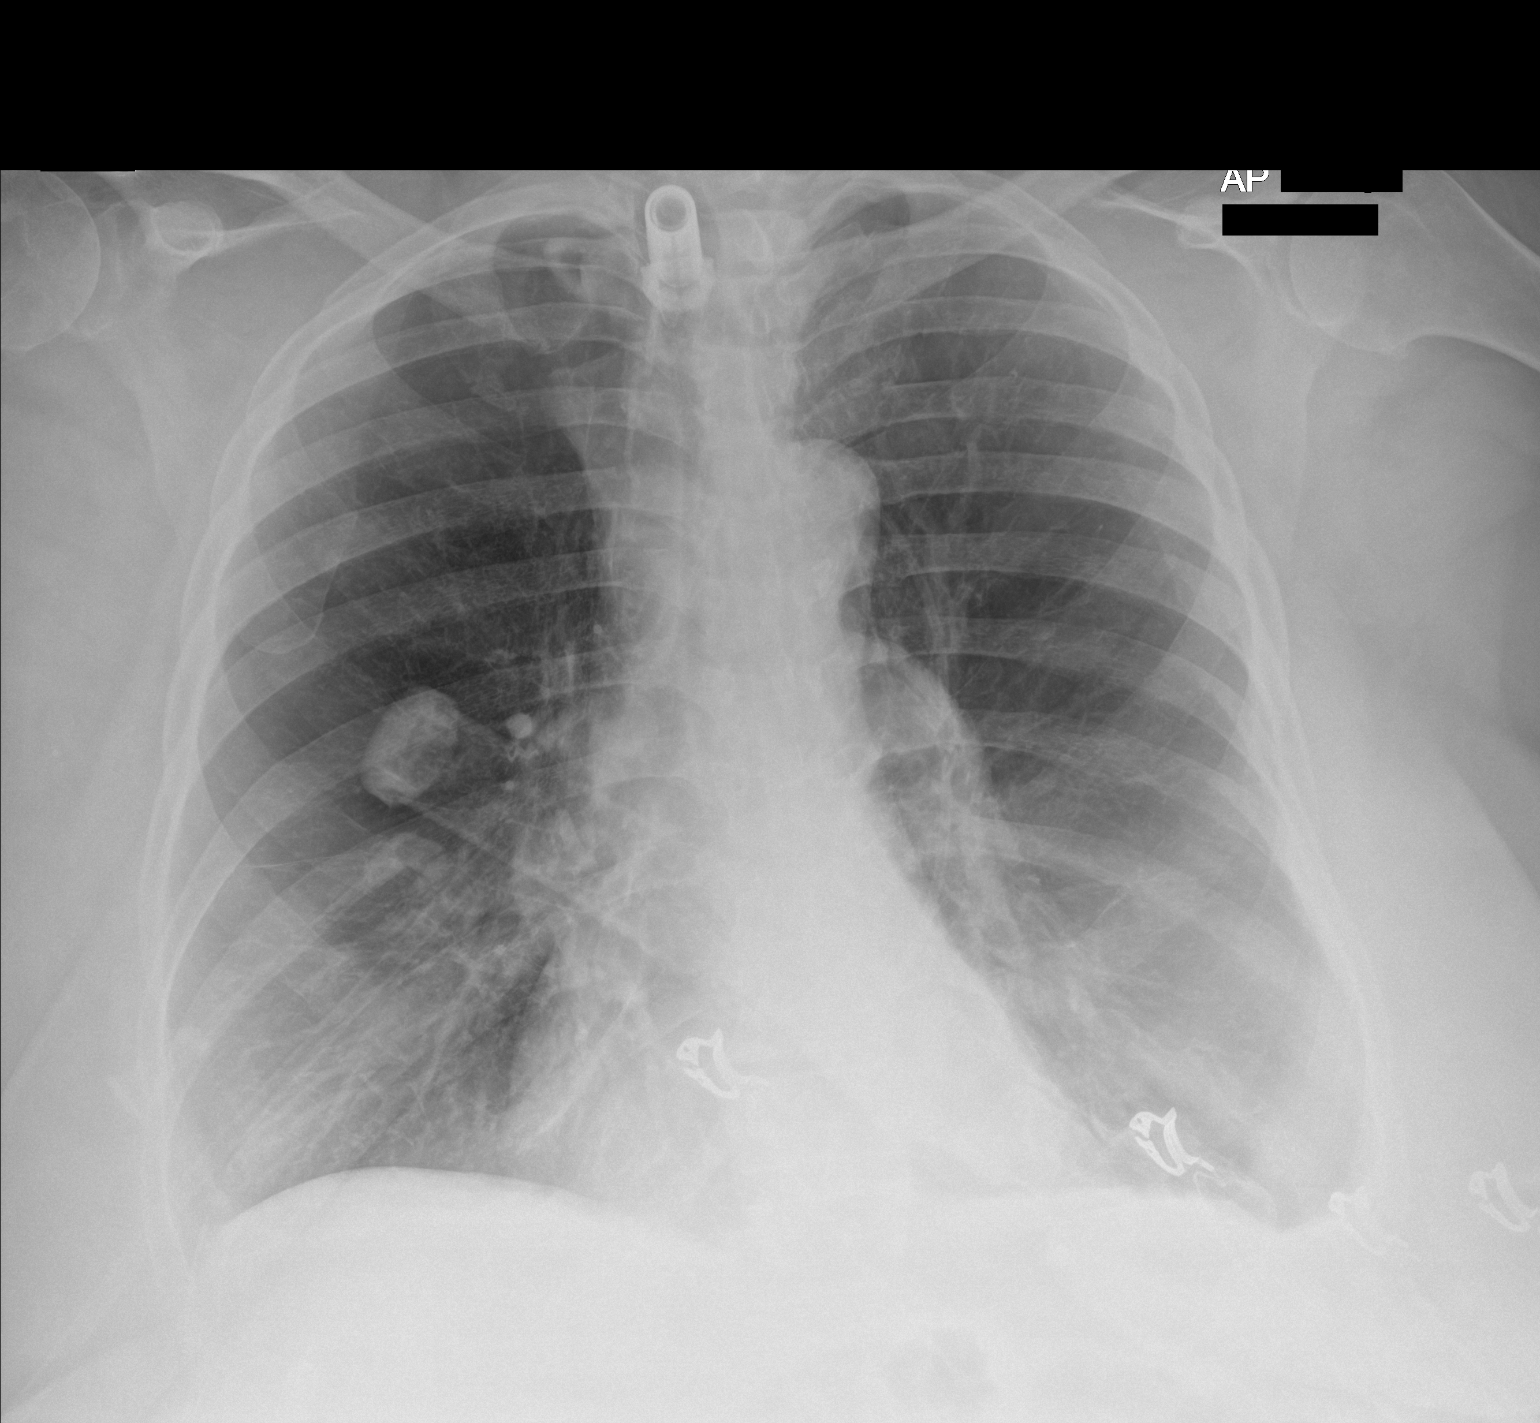

[1 of 1 positions shown; findings below may reference images not displayed]

FINDINGS: There is stable tracheostomy tube positioning.

The lungs are hyperinflated.

There is no evidence of acute infiltrate, pleural effusion or
pneumothorax.

The heart size and mediastinal contours are within normal limits.

The visualized skeletal structures are unremarkable.
IMPRESSION: No active disease.

## 2020-12-13 ENCOUNTER — Encounter: Payer: Self-pay | Admitting: Dermatology

## 2020-12-13 ENCOUNTER — Telehealth: Payer: Self-pay

## 2020-12-13 NOTE — Telephone Encounter (Signed)
-----   Message from Alfonso Patten, MD sent at 12/13/2020  5:11 PM EDT ----- Culture resuls showed MRSA sensitive to doxycycline and beta hemolytic strep as before. I&D already performed and patient treated with doxycycline. As long as she is improved, no additional treatment needed. If having trouble, should return to clinic to see if she needs additional I&D  MAs please call. Thank you!

## 2020-12-22 ENCOUNTER — Telehealth: Payer: Self-pay

## 2020-12-22 DIAGNOSIS — B379 Candidiasis, unspecified: Secondary | ICD-10-CM

## 2020-12-22 NOTE — Telephone Encounter (Signed)
Patient's daughter called today regarding a current yeast infection. Patient was prescribed Diflucan last month due to one round of antibiotic but then needed another round from last office visit. Daughter states she is finished with medication but has had another yeast infection for the last week-two weeks. Do they need another round of medication?

## 2020-12-26 MED ORDER — FLUCONAZOLE 150 MG PO TABS: ORAL_TABLET | ORAL | 0 refills | Status: DC

## 2020-12-26 NOTE — Telephone Encounter (Signed)
RX sent in and left detailed voicemail on daughters voicemail that this was done.

## 2021-01-19 ENCOUNTER — Other Ambulatory Visit: Payer: Medicaid Other | Admitting: Primary Care

## 2021-01-19 ENCOUNTER — Other Ambulatory Visit: Payer: Self-pay

## 2021-01-19 DIAGNOSIS — I5032 Chronic diastolic (congestive) heart failure: Secondary | ICD-10-CM

## 2021-01-19 DIAGNOSIS — Z930 Tracheostomy status: Secondary | ICD-10-CM

## 2021-01-19 DIAGNOSIS — B3789 Other sites of candidiasis: Secondary | ICD-10-CM

## 2021-01-19 DIAGNOSIS — J9610 Chronic respiratory failure, unspecified whether with hypoxia or hypercapnia: Secondary | ICD-10-CM

## 2021-01-19 DIAGNOSIS — F418 Other specified anxiety disorders: Secondary | ICD-10-CM

## 2021-01-19 DIAGNOSIS — Z515 Encounter for palliative care: Secondary | ICD-10-CM

## 2021-01-19 NOTE — Progress Notes (Signed)
Michelle [name] Consult Note Telephone: [phone]  Fax: [phone]    Date of encounter: 01/19/21 11:23 AM PATIENT NAME: Michelle [name] [address] Michelle [name] [address]   [phone] (home)  DOB: 11/13/1960 MRN: [id number] PRIMARY CARE PROVIDER:    [facility], [facility] [place] [address] [phone]  REFERRING PROVIDER:   Alvester Chou, NP [address] [place],  [place] [address] [phone]  RESPONSIBLE PARTY:    Contact Information     Name Relation Home Work [place], [name] Daughter [phone]  [phone]   Michelle [name], Michelle [name] Daughter [phone]  [phone]   Michelle [name], Michelle [name] Sister [phone]    Michelle [name] Significant other   [phone]   Michelle [name]   [phone]        I met face to face with patient and family in  home. Palliative Care was asked to follow this patient by consultation request of  Michelle Chou, NP to address advance care planning and complex medical decision making. This is a follow up visit.                                   ASSESSMENT AND PLAN / RECOMMENDATIONS:   Advance Care Planning/Goals of Care: Goals include to maximize quality of life and symptom management. Our advance care planning conversation included a discussion about:    The value and importance of advance care planning  Experiences with loved ones who have been seriously ill or have died  Exploration of personal, cultural or spiritual beliefs that might influence medical decisions  Exploration of goals of care in the event of a sudden injury or illness  Identification and preparation of a healthcare agent  Review of an  advance directive document .  CODE STATUS: DNR  Symptom Management/Plan:  Glucose management: Discussed decreasing bg with AC checks, Daughter has been checking PC. Patient also states she can take this over, and start to do her glucose  assessments. Also instructed as PCP to increase basal slowly. Recommended glucagon in the house.  Reports A1C  close to 8%, cannot remember exactly. Reports glucose 200-300 pc.   Resp status: Stable . Using nebs and home NIV. Has respiratory tx support from company. Follows with pulmonology for trach changes. Plan is to Rx in place for any infections or COPD exacerbations to avoid hospitalizations.  BP: Has run out of clonidine, I have re sent prescription to [facility] in [place].BP elevated today.  Skin eruptions: Has intermittent yeast in intertriginous areas. Using anti fungal as needed. Recommended interdry.  Depression: Endorses boredom. Has ability to increase some adls, and has been helping more with household. Also has a scooter she'd like to get to her new home and try out. On mirtazapine but may benefit from another choices as intake is good.  Follow up Palliative Care Visit: Palliative care will continue to follow for complex medical decision making, advance care planning, and clarification of goals. Return 6-8 weeks or prn.  I spent 60 minutes providing this consultation. More than 50% of the time in this consultation was spent in counseling and care coordination.  PPS: 40%  HOSPICE ELIGIBILITY/DIAGNOSIS: no  Chief Complaint: elevated BG  HISTORY OF PRESENT ILLNESS:  Michelle [name] is a 60 y.o. year old female  with DM, COPD, depression, oxygen and vent dependent .   History obtained from  review of EMR, discussion with primary team, and interview with family, facility staff/caregiver and/or Michelle [name].  I reviewed available labs, medications, imaging, studies and related documents from the EMR.  Records reviewed and summarized above.   ROS   General: NAD EYES: denies vision changes ENMT: denies dysphagia Cardiovascular: denies chest pain, denies DOE Pulmonary: endorses cough, denies increased SOB, trach in place Abdomen: endorses fair to good appetite, denies constipation,  endorses continence of bowel GU: denies dysuria, endorses continence of urine MSK:  denies increased weakness,  no falls reported Skin: denies rashes or wounds Neurological: denies pain, denies insomnia Psych: Endorses positive mood, down at times  Heme/lymph/immuno: denies bruises, abnormal bleeding  Physical Exam: Current and past weights: 170 lbs, has lost some weight Constitutional: NAD, 168/92  HR 85 RR 20 General: frail appearing, obese  EYES: anicteric sclera, lids intact, no discharge  ENMT: intact hearing, oral mucous membranes moist, dentition intact CV: S1S2, RRR, no LE edema Pulmonary:  wheezes throughout,  no increased work of breathing, no cough, oxygen via NIV Abdomen: intake 50%, normo-active BS + 4 quadrants, soft and non tender, no ascites, glucose 250-300's. GU: deferred MSK: no sarcopenia, moves all extremities,  non ambulatory Skin: warm and dry, no rashes or wounds on visible skin Neuro:  min.  generalized weakness,  no cognitive impairment Psych: non-anxious affect, A and O x 3 Hem/lymph/immuno: no widespread bruising  Outpatient Encounter Medications as of 01/19/2021  Medication Sig   aspirin 81 MG chewable tablet Chew 81 mg by mouth daily.    blood glucose meter kit and supplies KIT Dispense based on patient and insurance preference. Use up to four times daily as directed. (FOR ICD-9 250.00, 250.01).   budesonide (PULMICORT) 0.25 MG/2ML nebulizer solution Take 2 mLs (0.25 mg total) by nebulization 2 (two) times daily. (Patient taking differently: Take 0.5 mg by nebulization 2 (two) times daily.)   clonazePAM (KLONOPIN) 0.5 MG tablet Take 0.5 mg by mouth in the morning, at noon, and at bedtime.   fluconazole (DIFLUCAN) 150 MG tablet Take 1 tablet day 1 repeat in 1 week   fluticasone (FLONASE) 50 MCG/ACT nasal spray Place 1 spray into both nostrils daily.    gabapentin (NEURONTIN) 100 MG capsule Take 100 mg by mouth 3 (three) times daily. One tablet in am and mid  day, and 3 at bedtime.   guaiFENesin (MUCINEX) 600 MG 12 hr tablet Take 600 mg by mouth 2 (two) times daily.   insulin detemir (LEVEMIR) 100 UNIT/ML FlexPen Inject 12 Units into the skin at bedtime.   Insulin Pen Needle (PEN NEEDLES) 32G X 6 MM MISC 1 each by Does not apply route 4 (four) times daily - after meals and at bedtime.   ipratropium-albuterol (DUONEB) 0.5-2.5 (3) MG/3ML SOLN Take 3 mLs by nebulization every 6 (six) hours. (Patient taking differently: Take 3 mLs by nebulization every 6 (six) hours as needed (for shortness of breath).)   mirtazapine (REMERON SOL-TAB) 30 MG disintegrating tablet Take 1 tablet (30 mg total) by mouth at bedtime.   terbinafine (LAMISIL) 1 % cream Apply 1 application topically 2 (two) times daily. To affected skin, for 1 week after resolution of rash. # 30 gm, 1 refill.   cloNIDine (CATAPRES - DOSED IN MG/24 HR) 0.1 mg/24hr patch Place 0.1 mg onto the skin once a week. (Patient not taking: Reported on 01/19/2021)   mupirocin ointment (BACTROBAN) 2 % Apply 1 application topically daily. To inside nose and to affected area of axilla (  Patient not taking: Reported on 01/19/2021)   [DISCONTINUED] fluconazole (DIFLUCAN) 150 MG tablet Take 1 tablet day 1 repeat in 1 week   [DISCONTINUED] predniSONE (DELTASONE) 10 MG tablet 50 mg daily x 2 days, then 40 mg daily x 2 days, then 30 mg daily x 2 days, then 20 mg daily x 2 days, then 10 mg daily x 2 days. (Patient not taking: No sig reported)   No facility-administered encounter medications on file as of 01/19/2021.    Thank you for the opportunity to participate in the care of Ms. Levandoski.  The palliative care team will continue to follow. Please call our office at [phone] if we can be of additional assistance.   Jason Coop, NP   COVID-19 PATIENT SCREENING TOOL Asked and negative response unless otherwise noted:   Have you had symptoms of covid, tested positive or been in contact with someone with  symptoms/positive test in the past 5-10 days?

## 2021-02-03 ENCOUNTER — Encounter: Payer: Self-pay | Admitting: Family Medicine

## 2021-02-07 ENCOUNTER — Other Ambulatory Visit: Payer: Medicaid Other | Admitting: Primary Care

## 2021-02-07 ENCOUNTER — Other Ambulatory Visit: Payer: Self-pay

## 2021-02-07 DIAGNOSIS — R339 Retention of urine, unspecified: Secondary | ICD-10-CM | POA: Insufficient documentation

## 2021-02-07 DIAGNOSIS — Z515 Encounter for palliative care: Secondary | ICD-10-CM

## 2021-02-07 DIAGNOSIS — I5032 Chronic diastolic (congestive) heart failure: Secondary | ICD-10-CM

## 2021-02-07 NOTE — Progress Notes (Signed)
[facility] Consult Note Telephone: [phone]  Fax: [phone]    Date of encounter: 02/07/21 8:05 PM PATIENT NAME: Michelle [name] [address] Michelle [name] [address]   [phone] (home)  DOB: 1961/01/09 MRN: [id number] PRIMARY CARE PROVIDER:    [facility], [facility],  [place] [address] [phone]  REFERRING PROVIDER:   Center, [facility] [place],  [place] [address] [phone]   Alvester Chou NP   RESPONSIBLE PARTY:    Contact Information     Name Relation Home Work [place], [name] Daughter [place]   Kathy, Wahid Daughter [phone]  [phone]   Felicity, Penix Sister [phone]    [name] Significant other   [phone]   [name]   [phone]        I met face to face with patient and family in home. Palliative Care was asked to follow this patient by consultation request of  [facility], [facility] to address advance care planning and complex medical decision making. This is a follow up visit.                                   ASSESSMENT AND PLAN / RECOMMENDATIONS:    CODE STATUS: DNR  Symptom Management/Plan:  I met with patient in her home with her daughter present. She has been doing generally well. She denies feeling ill or having any fevers or cough. She has been eating at her baseline. She endorses two new medications, atorvastatin and iron.   Daughter endorses that she has had a hard time urinating for the past 24 hours. She usually uses a bedpan while staying in her bed. I asked her to attempt the bedside toilet with Crede. This was not successful at eliciting more than a small amount.   I cathed her for a urine specimen for culture and sensitivity and for retention. She had 300 mls of clear urine with a little sediment, no  excessive odor. Appeared straw color. I am  sending for culture and sensitivity as well as urinalysis. We dicussed s/sx uti, and treatment options. She would want Rx with abx if indicated. Her management plan includes avoiding ED and hospitalizations as with her trach and NIV, she must be in the ICU when she is admitted.  Patient has been maintaining with her respiratory status no infections or issues with her an IV her trach or her home nebulizers. She follows with pulmonary for trach changes and any other problems. She denies increased dyspnea.    Follow up Palliative Care Visit: Palliative care will continue to follow for complex medical decision making, advance care planning, and clarification of goals. Return 6-8 weeks or prn.  I spent 40 minutes providing this consultation. More than 50% of the time in this consultation was spent in counseling and care coordination.  PPS: 40%  HOSPICE ELIGIBILITY/DIAGNOSIS: no  Chief Complaint: urine retention, possible UTI  HISTORY OF PRESENT ILLNESS:  Michelle [name] is a 60 y.o. year old female  with h/o COPD, vent dependence, urine retention per report, suspicion of uti .   History obtained from review of EMR, discussion with primary team, and interview with family, facility staff/caregiver and/or Michelle [name].  I reviewed available labs, medications, imaging, studies and related documents from the EMR.  Records reviewed and summarized above.  ROS   General: NAD ENMT: denies dysphagia Cardiovascular: denies chest pain, denies DOE Pulmonary: denies cough, denies increased SOB Abdomen: endorses good appetite, denies constipation, endorses continence of bowel GU: denies dysuria, endorses continence of urine, endorses retention MSK:  denies weakness,  no falls reported Skin: denies rashes or wounds Neurological: denies pain, denies insomnia Psych: Endorses positive mood Heme/lymph/immuno: denies bruises, abnormal bleeding  Physical Exam: Current and past weights: stable  Constitutional:  NAD General: frail appearing EYES: anicteric sclera, lids intact, no discharge  ENMT: intact hearing, oral mucous membranes moist CV: no LE edema Pulmonary: no increased work of breathing, no cough, room air Abdomen: intake 100%, no ascites GU: deferred MSK: no sarcopenia, moves all extremities, non ambulatory Skin: warm and dry, no rashes or wounds on visible skin Neuro:  + generalized weakness,  no cognitive impairment Psych: non-anxious affect, A and O x 3 Hem/lymph/immuno: no widespread bruising Outpatient Encounter Medications as of 02/07/2021  Medication Sig   aspirin 81 MG chewable tablet Chew 81 mg by mouth daily.    atorvastatin (LIPITOR) 40 MG tablet Take 40 mg by mouth at bedtime.   blood glucose meter kit and supplies KIT Dispense based on patient and insurance preference. Use up to four times daily as directed. (FOR ICD-9 250.00, 250.01).   budesonide (PULMICORT) 0.25 MG/2ML nebulizer solution Take 2 mLs (0.25 mg total) by nebulization 2 (two) times daily. (Patient taking differently: Take 0.5 mg by nebulization 2 (two) times daily.)   clonazePAM (KLONOPIN) 0.5 MG tablet Take 0.5 mg by mouth in the morning, at noon, and at bedtime.   cloNIDine (CATAPRES - DOSED IN MG/24 HR) 0.1 mg/24hr patch Place 0.1 mg onto the skin once a week. (Patient not taking: Reported on 01/19/2021)   Ferrous Sulfate (IRON) 325 (65 Fe) MG TABS Take 1 tablet by mouth every other day.   fluconazole (DIFLUCAN) 150 MG tablet Take 1 tablet day 1 repeat in 1 week   fluticasone (FLONASE) 50 MCG/ACT nasal spray Place 1 spray into both nostrils daily.    gabapentin (NEURONTIN) 100 MG capsule Take 100 mg by mouth 3 (three) times daily. One tablet in am and mid day, and 3 at bedtime.   guaiFENesin (MUCINEX) 600 MG 12 hr tablet Take 600 mg by mouth 2 (two) times daily.   insulin detemir (LEVEMIR) 100 UNIT/ML FlexPen Inject 12 Units into the skin at bedtime.   Insulin Pen Needle (PEN NEEDLES) 32G X 6 MM MISC 1 each  by Does not apply route 4 (four) times daily - after meals and at bedtime.   ipratropium-albuterol (DUONEB) 0.5-2.5 (3) MG/3ML SOLN Take 3 mLs by nebulization every 6 (six) hours. (Patient taking differently: Take 3 mLs by nebulization every 6 (six) hours as needed (for shortness of breath).)   mirtazapine (REMERON SOL-TAB) 30 MG disintegrating tablet Take 1 tablet (30 mg total) by mouth at bedtime.   mupirocin ointment (BACTROBAN) 2 % Apply 1 application topically daily. To inside nose and to affected area of axilla (Patient not taking: Reported on 01/19/2021)   terbinafine (LAMISIL) 1 % cream Apply 1 application topically 2 (two) times daily. To affected skin, for 1 week after resolution of rash. # 30 gm, 1 refill.   [DISCONTINUED] fluconazole (DIFLUCAN) 150 MG tablet Take 1 tablet day 1 repeat in 1 week   No facility-administered encounter medications on file as of 02/07/2021.     Thank you for the opportunity to participate in the care of Michelle [name].  The  palliative care team will continue to follow. Please call our office at [phone] if we can be of additional assistance.   Jason Coop, NP   COVID-19 PATIENT SCREENING TOOL Asked and negative response unless otherwise noted:   Have you had symptoms of covid, tested positive or been in contact with someone with symptoms/positive test in the past 5-10 days?

## 2021-02-10 ENCOUNTER — Telehealth: Payer: Self-pay | Admitting: Primary Care

## 2021-02-10 NOTE — Telephone Encounter (Signed)
Message to daughter, u/a showed 6-10 WBC count but no bacteria. No treatment needed at this time but call if sx develop.

## 2021-02-20 ENCOUNTER — Telehealth: Payer: Self-pay

## 2021-02-20 NOTE — Telephone Encounter (Signed)
Pt. Is ready to schedule procedure. Please call daughter Gavin Potters phone anytime between 3 pm and 4 pm

## 2021-02-24 NOTE — Telephone Encounter (Signed)
Patient's daughter-[name] was contacted and I informed her that her mother needed to see Dr. Vicente Males since she was having abdominal pain first and then we could schedule her a colonoscopy. [name] agreed. Appointment information was provided to [name] and I also told her that I would mail it to them and she agreed.

## 2021-03-06 ENCOUNTER — Other Ambulatory Visit: Payer: Self-pay

## 2021-03-06 ENCOUNTER — Ambulatory Visit: Payer: Self-pay | Admitting: Gastroenterology

## 2021-04-11 ENCOUNTER — Other Ambulatory Visit: Payer: Medicaid Other | Admitting: Primary Care

## 2021-04-11 ENCOUNTER — Other Ambulatory Visit: Payer: Self-pay

## 2021-04-11 DIAGNOSIS — Z794 Long term (current) use of insulin: Secondary | ICD-10-CM

## 2021-04-11 DIAGNOSIS — R0602 Shortness of breath: Secondary | ICD-10-CM

## 2021-04-11 DIAGNOSIS — J441 Chronic obstructive pulmonary disease with (acute) exacerbation: Secondary | ICD-10-CM

## 2021-04-11 DIAGNOSIS — E1165 Type 2 diabetes mellitus with hyperglycemia: Secondary | ICD-10-CM

## 2021-04-11 DIAGNOSIS — Z515 Encounter for palliative care: Secondary | ICD-10-CM

## 2021-04-11 NOTE — Progress Notes (Signed)
[facility] Consult Note Telephone: [phone]  Fax: [phone]    Date of encounter: 04/11/21 12:41 PM PATIENT NAME: Michelle [name] [address] Michelle [name] [address]   [phone] (home)  DOB: 02-17-1961 MRN: [id number] PRIMARY CARE PROVIDER:    Alvester Chou, NP,  [address] [place] [address] [phone]  REFERRING PROVIDER:   Alvester Chou, NP [address] [place],  [place] [address] [phone]  RESPONSIBLE PARTY:    Contact Information     Name Relation Home Work Michelle [name], [name] Daughter [phone]  [phone]   Michelle, [name] Daughter [phone]  [phone]   Michelle, [name] Sister [phone]    Michelle [name] Significant other   [phone]   Michelle [name]   [phone]        I met face to face with patient and family in  home. Palliative Care was asked to follow this patient by consultation request of  Michelle Chou, NP to address advance care planning and complex medical decision making. This is a follow up visit.                                   ASSESSMENT AND PLAN / RECOMMENDATIONS:   Advance Care Planning/Goals of Care: Goals include to maximize quality of life and symptom management. Our advance care planning conversation included a discussion about:    Exploration of personal, cultural or spiritual beliefs that might influence medical decisions  Exploration of goals of care in the event of a sudden injury or illness  Review  of an  advance directive document .  CODE STATUS: dnr  I reviewed a MOST form today. The patient and family outlined their wishes for the following treatment decisions:  Cardiopulmonary Resuscitation: Do Not Attempt Resuscitation (DNR/No CPR)  Medical Interventions: Full Scope of Treatment: Use intubation, advanced airway interventions, mechanical ventilation, cardioversion as indicated, medical treatment, IV fluids, etc, also  provide comfort measures. Transfer to the hospital if indicated  Antibiotics: Antibiotics if indicated  IV Fluids: IV fluids if indicated  Feeding Tube: No feeding tube    Symptom Management/Plan:  Respiratory: states she feels a cold coming on, instructed to try zinc. Has appt for trach replacement tomorrow. I've advised her to discuss if sx worsens, with ent. Daughter endorses some 'green' sputum but this clears after the first suction of the day. Denies SOB or increased cough. I have also instructed to call me if sx worsen.  BG management and Nutrition: Eating well, has am bg in upper 200's, 180s at hs. Instructed to increase basal units to 14. Noted in medicines. We discussed Freestyle Libre, which is $130/ month on good Rx. They will consider, feel it might be good for a few months to track sugars. Due for labs .  Follow up Palliative Care Visit: Palliative care will continue to follow for complex medical decision making, advance care planning, and clarification of goals. Return 8 weeks or prn.  I spent 60 minutes providing this consultation. More than 50% of the time in this consultation was spent in counseling and care coordination.   PPS: 40%  HOSPICE ELIGIBILITY/DIAGNOSIS: no  Chief Complaint: hyperglycemia  HISTORY OF PRESENT ILLNESS:  Michelle [name] is a 60 y.o. year old female  with h/o copd, airway obstruction, niv vent dependent, DM, obesity, anxiety.   History obtained from review of EMR, discussion  with primary team, and interview with family, facility staff/caregiver and/or Michelle [name].  I reviewed available labs, medications, imaging, studies and related documents from the EMR.  Records reviewed and summarized above.   ROS  General: NAD EYES: denies vision changes ENMT: denies dysphagia Cardiovascular: denies chest pain, denies DOE Pulmonary: endorses cough, denies increased SOB, on trach to vent  Abdomen: endorses good appetite, endorses  constipation, endorses  continence of bowel GU: denies dysuria, endorses continence of urine MSK:  denies new weakness,  no falls reported Skin: denies rashes or wounds Neurological: endorses pain, denies insomnia Psych: Endorses positive mood Heme/lymph/immuno: denies bruises, abnormal bleeding  Physical Exam: Current and past weights:not available Constitutional: NAD General: frail appearing, obese  EYES: anicteric sclera, lids intact, no discharge  ENMT: intact hearing, oral mucous membranes moist, dentition intact CV: S1S2, RRR, no LE edema Pulmonary: wheezes throughout,  no increased work of breathing, + cough, on oxygen per NIV vent and trach Abdomen: intake 100%, normo-active BS + 4 quadrants, soft and non tender, no ascites GU: deferred MSK: mild  sarcopenia, moves all extremities,  non ambulatory Skin: warm and dry, no rashes or wounds on visible skin Neuro:  + generalized weakness,  no cognitive impairment Psych: non-anxious affect, A and O x 3 Hem/lymph/immuno: no widespread bruising  Thank you for the opportunity to participate in the care of Ms. Lenig.  The palliative care team will continue to follow. Please call our office at [phone] if we can be of additional assistance.   Jason Coop, NP DNP, AGPCNP-BC  COVID-19 PATIENT SCREENING TOOL Asked and negative response unless otherwise noted:   Have you had symptoms of covid, tested positive or been in contact with someone with symptoms/positive test in the past 5-10 days?

## 2021-06-08 ENCOUNTER — Other Ambulatory Visit: Payer: Medicaid Other | Admitting: Primary Care

## 2021-06-08 ENCOUNTER — Other Ambulatory Visit: Payer: Self-pay

## 2021-06-08 VITALS — BP 150/100 | HR 82 | Resp 18 | Ht 63.0 in | Wt 170.0 lb

## 2021-06-08 DIAGNOSIS — Z794 Long term (current) use of insulin: Secondary | ICD-10-CM

## 2021-06-08 DIAGNOSIS — Z930 Tracheostomy status: Secondary | ICD-10-CM

## 2021-06-08 DIAGNOSIS — Z515 Encounter for palliative care: Secondary | ICD-10-CM

## 2021-06-08 DIAGNOSIS — J9610 Chronic respiratory failure, unspecified whether with hypoxia or hypercapnia: Secondary | ICD-10-CM

## 2021-06-08 DIAGNOSIS — E1165 Type 2 diabetes mellitus with hyperglycemia: Secondary | ICD-10-CM

## 2021-06-08 NOTE — Progress Notes (Signed)
[facility] Palliative Care Consult Note Telephone: [phone]  Fax: [phone]    Date of encounter: 06/08/21 11:06 AM PATIENT NAME: Michelle [name] [address] [name] [address]   [phone] (home)  DOB: 04-Aug-1960 MRN: [id number] PRIMARY CARE PROVIDER:    Alvester Chou, NP,  [address] [place] [address] [phone]  REFERRING PROVIDER:   Alvester Chou, NP [address] [place],  [place] [address] [phone]  RESPONSIBLE PARTY:    Contact Information     Name Relation Home Work [place], [name] Daughter [phone]  [phone]   Evagelia, Knack Daughter [phone]  [phone]   Pernie, [place] Sister [phone]    [name] Significant other   [phone]   [name]   [phone]        I met face to face with patient and family in  home. Palliative Care was asked to follow this patient by consultation request of  Alvester Chou, NP to address advance care planning and complex medical decision making. This is a follow up visit.                                   ASSESSMENT AND PLAN / RECOMMENDATIONS:   Advance Care Planning/Goals of Care: Goals include to maximize quality of life and symptom management. Our advance care planning conversation included a discussion about:     Exploration of personal, cultural or spiritual beliefs that might influence medical decisions  Review  of an  advance directive document .Marland Kitchen CODE STATUS: DNR  I reviewed a MOST form today. The patient and family outlined their wishes for the following treatment decisions:  Cardiopulmonary Resuscitation: Do Not Attempt Resuscitation (DNR/No CPR)  Medical Interventions: Full Scope of Treatment: Use intubation, advanced airway interventions, mechanical ventilation, cardioversion as indicated, medical treatment, IV fluids, etc, also provide comfort measures. Transfer to the hospital if indicated  Antibiotics:  Antibiotics if indicated  IV Fluids: IV fluids if indicated  Feeding Tube: No feeding tube   Symptom Management/Plan:  Hyperglycemia: Glucose 295 after eating 1 hr. Has soda at bedside. Education provided. Pt endorses drinking full sugar drinks when she does, prefers water to diet drinks. Discussed new anti -glycemic, has not been obtained yet. Has been taking some sort of sliding scale for basal insulin, I recommend increase to 16 units  q am and maintain this, along with SSI meal time.Most recent A1c 11.8% which is down from 12.7%.  Family would like to change insulin to am administration due to daughter's work schedule.  Trulicity started by PCP, not yet obtained.  HTN: BP 150/100, Taking amlodipine  5 mg daily and has taken this am. Has  clonidine patch. May need increase in medication as target bp still not reached. I have asked family to have LPN attending home trach to record the bps on in home log. Lipids elevated per recent lab review, PCP has prescribed atorvastatin and fenofibrate, not yet obtained.  UTI: No recent s/sx reported. Has h/o recurrent and may be related to chronic hyperglycemia.  Vitamin D: low at 4.5, 50,000 u twice weekly ordered by pcp, not yet obtained.   Pain: Endorses in L LE, sounds chronic from immobility. Bed  exercises recommended, more oob activity recommended. Has been on percocet ordered from previous PCP, will work on de prescribing, increasing activity and using otc analgesia.  Follow up Palliative  Care Visit: Palliative care will continue to follow for complex medical decision making, advance care planning, and clarification of goals. Return 8 weeks or prn.  I spent 70 minutes providing this consultation. More than 50% of the time in this consultation was spent in counseling and care coordination.  PPS: 40%  HOSPICE ELIGIBILITY/DIAGNOSIS: TBD  Chief Complaint: hyperglycemia  HISTORY OF PRESENT ILLNESS:  Michelle [name] is a 61 y.o. year old female  with  COPD, trach /vent dependent, DM, obesity, hyperglycemia .   History obtained from review of EMR, discussion with primary team, and interview with family, facility staff/caregiver and/or Michelle [name].  I reviewed available labs, medications, imaging, studies and related documents from the EMR.  Records reviewed and summarized above.   ROS   General: NAD EYES: denies vision changes ENMT: denies dysphagia Cardiovascular: denies chest pain, denies DOE Pulmonary: endorses cough, denies increased SOB, endorses trach and NIV use Abdomen: endorses good appetite, denies constipation, endorses continence of bowel GU: denies dysuria, endorses continence of urine MSK:  denies increased weakness,  no falls reported Skin: denies rashes or wounds Neurological: endorses occ MSK  pain, denies insomnia Psych: Endorses positive mo Heme/lymph/immuno: denies bruises, abnormal bleeding  Physical Exam: Current and past weights: 170 lbs last reported Today's Vitals   06/08/21 1230  BP: (!) 150/100  Pulse: 82  Resp: 18  Weight: 170 lb (77.1 kg)  Height: _0  (1.6 m)  PainSc: 2   PainLoc: Leg   Body mass index is 30.11 kg/m.  Constitutional: NAD EYES: anicteric sclera, lids intact, no discharge  ENMT: intact hearing, oral mucous membranes moist CV: S1S2, RRR, no LE edema Pulmonary: LCTA, no increased work of breathing, no cough, oxygen 2 L  Abdomen: intake 100%, normo-active BS + 4 quadrants, soft and non tender, no ascites GU: deferred MSK: no sarcopenia, moves all extremities,  non ambulatory Skin: warm and dry, no rashes or wounds on visible skin Neuro:  baseline  generalized weakness,  no cognitive impairment Psych: non-anxious affect, A and O x 3 Hem/lymph/immuno: no widespread bruising  Thank you for the opportunity to participate in the care of Michelle [name].  The palliative care team will continue to follow. Please call our office at [phone] if we can be of additional assistance.    Jason Coop, NP DNP, AGPCNP-BC  COVID-19 PATIENT SCREENING TOOL Asked and negative response unless otherwise noted:   Have you had symptoms of covid, tested positive or been in contact with someone with symptoms/positive test in the past 5-10 days?

## 2021-08-03 ENCOUNTER — Other Ambulatory Visit: Payer: Medicaid Other | Admitting: Primary Care

## 2021-08-09 IMAGING — CR DG CHEST 2V
3 series · 3 of 3 positions shown · non-contrast
Comparison: Chest radiograph dated 08/28/2019.

CLINICAL DATA: 59-year-old female with cough and shortness of
breath.

EXAM:
CHEST - 2 VIEW

[chest lat (1 of 2)]
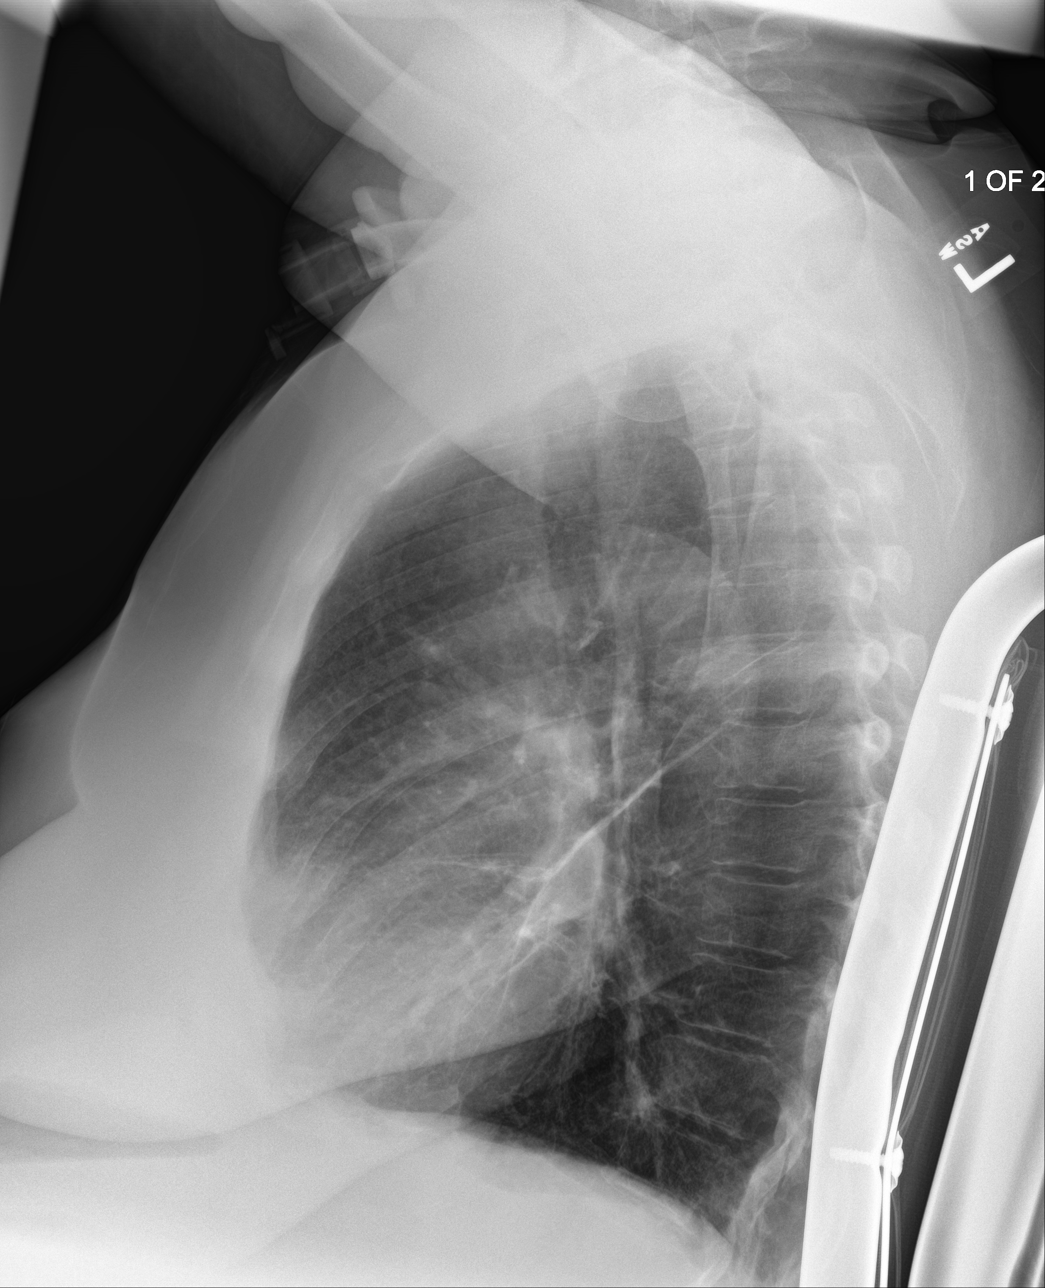

[chest ap]
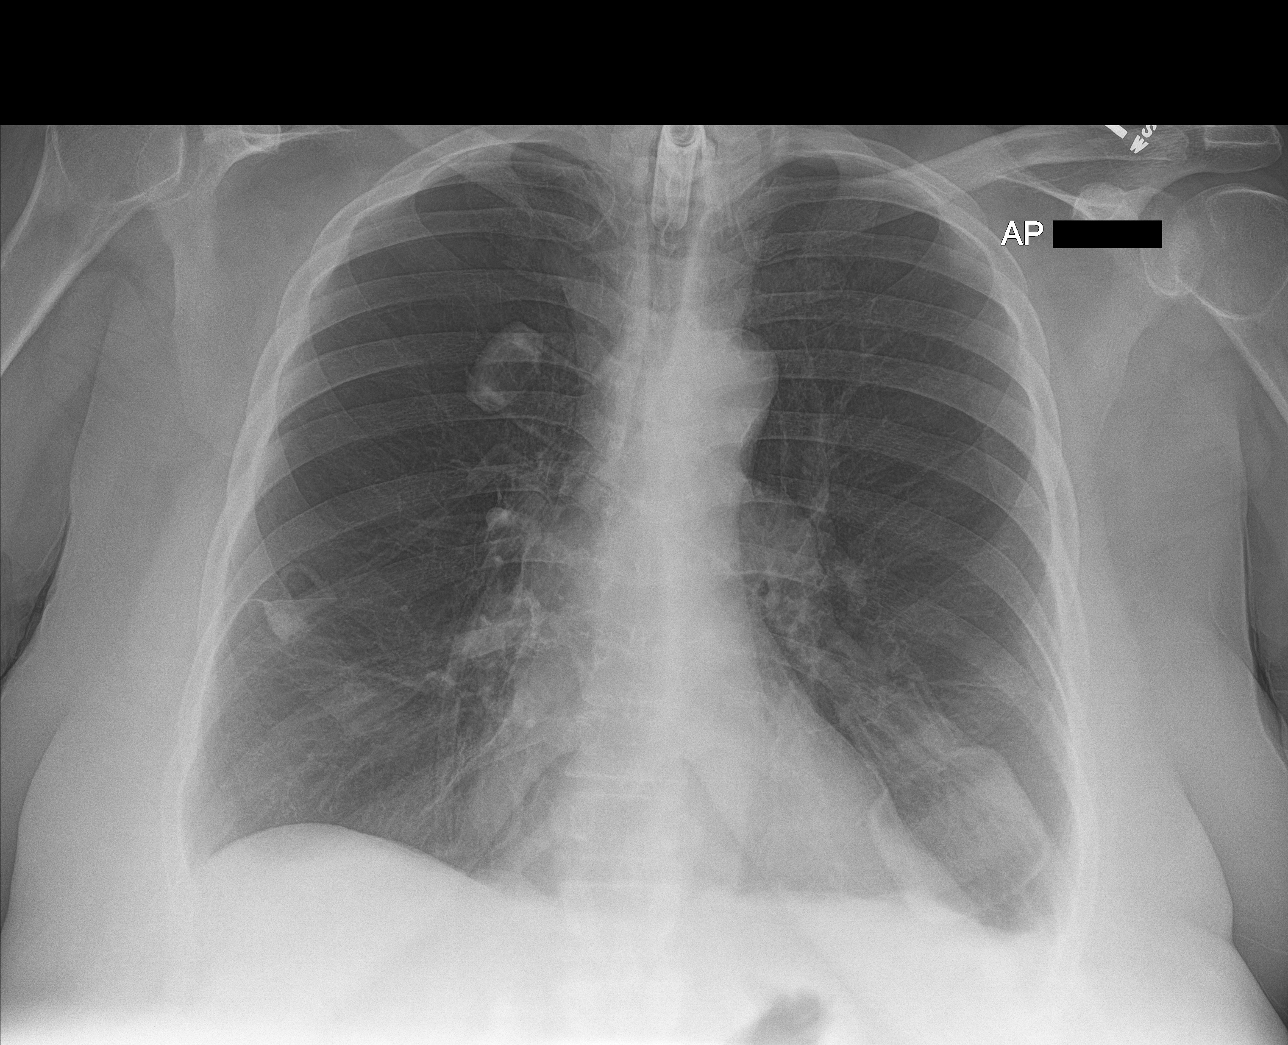

[chest lat (2 of 2)]
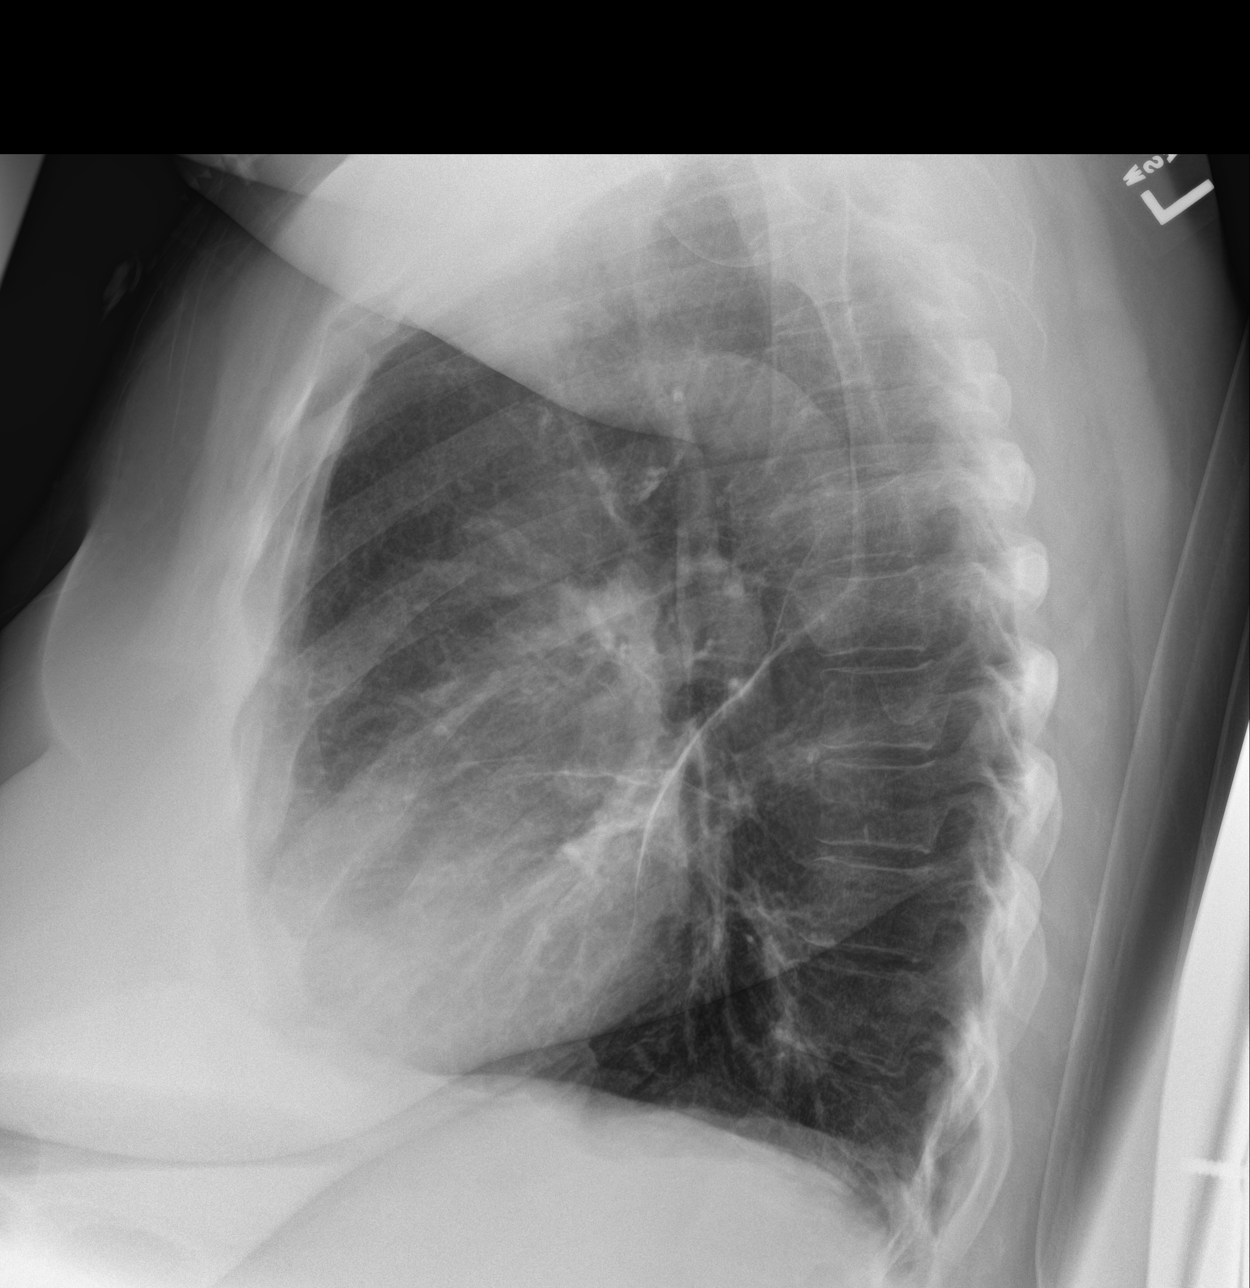

[3 of 3 positions shown; findings below may reference images not displayed]

FINDINGS: Tracheostomy above the carina. There is slight blunting of the left
costophrenic angle, chronic. No focal consolidation or pneumothorax.
The cardiac silhouette is within limits. No acute osseous pathology.
IMPRESSION: No acute cardiopulmonary process.

## 2021-08-15 ENCOUNTER — Other Ambulatory Visit: Payer: Self-pay

## 2021-08-15 ENCOUNTER — Other Ambulatory Visit: Payer: Medicaid Other | Admitting: Primary Care

## 2021-08-15 DIAGNOSIS — Z515 Encounter for palliative care: Secondary | ICD-10-CM

## 2021-08-15 DIAGNOSIS — B3789 Other sites of candidiasis: Secondary | ICD-10-CM

## 2021-08-15 DIAGNOSIS — E1165 Type 2 diabetes mellitus with hyperglycemia: Secondary | ICD-10-CM

## 2021-08-15 DIAGNOSIS — Z930 Tracheostomy status: Secondary | ICD-10-CM

## 2021-08-15 DIAGNOSIS — J9610 Chronic respiratory failure, unspecified whether with hypoxia or hypercapnia: Secondary | ICD-10-CM

## 2021-08-15 NOTE — Progress Notes (Signed)
? ? ?Man[facility] ?[facility] Palliative [facility] Consult Note ?Telephone: [phone]  ?Fax: [phone]  ? ? ?Date of encounter: 08/15/21 ?9:48 AM ?PATIENT NAME: Michelle [name] ?[address] ?[address] ?Michelle [name] [address]   ?[phone] (home)  ?DOB: Sep 11, 1960 ?MRN: [id number] ?PRIMARY CARE PROVIDER:    ?Alvester Chou, NP,  ?[address] ?[place] [address] ?[phone] ? ?REFERRING PROVIDER:   ?Alvester Chou, NP ?[address] ?[place],  [place] [address] ?[phone] ? ?RESPONSIBLE PARTY:    ?Contact Information   ? ? Name Relation Home Work Mobile  ? Michelle, [name] Daughter [phone]  [phone]  ? [name],Michelle Daughter [phone]  [phone]  ? [name], Michelle Sister [phone]   ? Michelle [name] Significant other   [phone]  ? Michelle [name]   [phone]  ? ?  ? ? ? ?I met face to face with patient and family in  home. Palliative Care was asked to follow this patient by consultation request of  Alvester Chou, NP to address advance care planning and complex medical decision making. This is a follow up visit. ? ?                                 ASSESSMENT AND PLAN / RECOMMENDATIONS:  ? ?Advance Care Planning/Goals of Care: Goals include to maximize quality of life and symptom management. Patient/health care surrogate gave his/her permission to discuss.Our advance care planning conversation included a discussion about:    ?Exploration of personal, cultural or spiritual beliefs that might influence medical decisions  ?CODE STATUS: DNR in home ?No aCP changes ? ?Symptom Management/Plan: ? ?Ear fullness: report ENT has found fluid behind tympanum. Pt denies s/sx otitis. Has persisted x 4 weeks. Recommend to increase Astelin to bid ( per order), she's been taking daily. Also to add bid flonase. She can also try an otc decongestant as she endorse PND. We dicussed also the environment, as she has moved this year to a housing that may have allergens. Had round of steroids with  mild improvements but also elevated BG. Try  otc for 2 weeks and stop if no improvement. Currently on cetirizine 10 mg. May add another anti histamine and suggested air purifier for older home. ? ? 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 Nail had fallen off from infection in 5/22 and has not regrown. She may have permanent nail bed damage. She has been Rx with nystatin. She has skin yeast which is fairly chronic. Again suggested interdry. Her glucose is also not well managed, which would potentiate proliferation. Suggested pro or prebiotic as well. ? ?Glucose improved on increase in insulin but just started increase of trulicity. Sugars are running in 200's with steroid course ending yesterday. A1C last assessed 11.8%. Dicussed 7% as a good target. Her bg checks are improving; 200's instead of 300's. She would benefit from constant monitoring system. I have asked SW Henrene Pastor to assess if any assistance programs to help with purchase of these devices.  ? ?BP running normotensive , has clonidine patch.  120/80 today , HR 88 ? ?Follow up Palliative Care Visit: Palliative care will continue to follow for complex medical decision making, advance care planning, and clarification of goals. Return 8 weeks or prn. ? ?This visit was coded based on medical decision making (MDM). ? ?PPS: 40% ? ?HOSPICE ELIGIBILITY/DIAGNOSIS: no ? ?Chief Complaint: hyperglycemia, skin yeast , ear fluid ? ?  HISTORY OF PRESENT ILLNESS:  Michelle [name] is a 61 y.o. year old female  with chronic hyperglycemia, skin yeast and ear fullness. Discussed as per above today RE home management of these issues . Patient seen today to review palliative care needs to include medical decision making and advance care planning as appropriate.  ? ?History obtained from review of EMR, discussion with primary team, and interview with family, facility staff/caregiver and/or Michelle [name].  ?I reviewed available labs, medications, imaging, studies and  related documents from the EMR.  Records reviewed and summarized above.  ? ?ROS ? ? ?General: NAD ?ENMT: denies dysphagia, endorses feeling of bil ear fullness ?Cardiovascular: denies chest pain, denies DOE ?Pulmonary: denies cough, denies increased SOB ?Abdomen: endorses good appetite, denies constipation, endorses continence of bowel ?GU: denies dysuria, endorses continence of urine ?MSK:  denies  increased weakness,  no falls reported ?Skin: endorses yeast  rash under breasts, no wounds ?Neurological: denies pain, denies insomnia ?Psych: Endorses positive mood ? ?Physical Exam: ?Current and past weights: 170 lbs per record ?Constitutional: 128/80 HR 88 RR 18 ?General: frail appearing, thin/WNWD/obese  ?EYES: anicteric sclera, lids intact, no discharge  ?ENMT: intact hearing, oral mucous membranes moist ?CV: S1S2, RRR, no LE edema ?Pulmonary: lung sounds diminished, scattered inspiratory wheezes, no increased work of breathing, occ  cough, NIV with oxygen, PO2 100% ?Abdomen: intake 100%, normo-active BS + 4 quadrants, soft and non tender, no ascites ?MSK: no sarcopenia, moves all extremities, limited in ambulation ?Skin: warm and dry, + rashes , -wounds on visible skin ?Neuro:  no new generalized weakness,  no cognitive impairment, non-anxious affect ? ? ?Thank you for the opportunity to participate in the care of Michelle [name].  The palliative care team will continue to follow. Please call our office at [phone] if we can be of additional assistance.  ? ?Jason Coop, NP DNP, AGPCNP-BC ? ?COVID-19 PATIENT SCREENING TOOL ?Asked and negative response unless otherwise noted:  ? ?Have you had symptoms of covid, tested positive or been in contact with someone with symptoms/positive test in the past 5-10 days?  ? ?

## 2021-08-17 ENCOUNTER — Ambulatory Visit
Admission: RE | Admit: 2021-08-17 | Discharge: 2021-08-17 | Disposition: A | Discharge status: Home / Self Care | Payer: Medicaid Other | Source: Ambulatory Visit | Attending: Adult Health | Admitting: Adult Health

## 2021-08-17 ENCOUNTER — Encounter: Payer: Self-pay | Admitting: Adult Health

## 2021-08-17 ENCOUNTER — Other Ambulatory Visit: Payer: Self-pay

## 2021-08-17 ENCOUNTER — Ambulatory Visit: Payer: Medicaid Other | Admitting: Adult Health

## 2021-08-17 VITALS — BP 128/84 | HR 80 | Temp 97.7°F | Ht 62.0 in | Wt 180.0 lb

## 2021-08-17 DIAGNOSIS — J449 Chronic obstructive pulmonary disease, unspecified: Secondary | ICD-10-CM | POA: Insufficient documentation

## 2021-08-17 DIAGNOSIS — I5032 Chronic diastolic (congestive) heart failure: Secondary | ICD-10-CM | POA: Diagnosis not present

## 2021-08-17 DIAGNOSIS — J441 Chronic obstructive pulmonary disease with (acute) exacerbation: Secondary | ICD-10-CM

## 2021-08-17 DIAGNOSIS — Z930 Tracheostomy status: Secondary | ICD-10-CM

## 2021-08-17 DIAGNOSIS — J9610 Chronic respiratory failure, unspecified whether with hypoxia or hypercapnia: Secondary | ICD-10-CM

## 2021-08-17 DIAGNOSIS — Z9911 Dependence on respirator [ventilator] status: Secondary | ICD-10-CM

## 2021-08-17 NOTE — Assessment & Plan Note (Signed)
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  Has high symptom burden.  Now on chronic steroids for greater than 1 year.  Patient has had significant weight gain with insulin-dependent diabetes. ?Long discussion regarding chronic steroid side effects. ?Would continue Pulmicort and DuoNeb for maintenance regimen.  Could consider adding Brovana and Yupelri and only using albuterol as needed ?Also could look at adding an [place]. ?For now we will check chest x-ray.  Try to decrease steroids down to baseline at 10 mg and more frequent follow-up to assess needed maintenance regimen ? ?Plan  ?Patient Instructions  ?Continue on Pulmicort Neb Twice daily   ?Continue on Duoneb Four times a day  .  ?Continue on Vent all the time.  ?Continue with Trach care .  ?Activity as tolerated. Up to chair daily as able .  ?Continue on Oxygen 4l/m  ?Chest xray today  ?Decrease Prednisone '20mg'$  alternating with '15mg'$  for 1 week then '15mg'$  daily for 1 week and then '15mg'$  alternating with '10mg'$  daily for 1 week and then hold '10mg'$  daily .  ?Follow up with Dr. Mortimer Fries in 4 weeks and As needed   ?Please contact office for sooner follow up if symptoms do not improve or worsen or seek emergency care  ? ?  ? ?

## 2021-08-17 NOTE — Assessment & Plan Note (Signed)
Patient is continue on continuous vent support with tracheostomy.  Continue with trach care at ENT. ?Appears compensated on current regimen with good O2 saturations. ? ?Plan  ?Patient Instructions  ?Continue on Pulmicort Neb Twice daily   ?Continue on Duoneb Four times a day  .  ?Continue on Vent all the time.  ?Continue with Trach care .  ?Activity as tolerated. Up to chair daily as able .  ?Continue on Oxygen 4l/m  ?Chest xray today  ?Decrease Prednisone '20mg'$  alternating with '15mg'$  for 1 week then '15mg'$  daily for 1 week and then '15mg'$  alternating with '10mg'$  daily for 1 week and then hold '10mg'$  daily .  ?Follow up with Dr. Mortimer Fries in 4 weeks and As needed   ?Please contact office for sooner follow up if symptoms do not improve or worsen or seek emergency care  ? ?  ? ?

## 2021-08-17 NOTE — Assessment & Plan Note (Signed)
Appears compensated without evidence of volume overload on exam.  Continue on current regimen 

## 2021-08-17 NOTE — Progress Notes (Signed)
? ?@Patient  ID: Michelle [name], female    DOB: 1960/07/15, 61 y.o.   MRN: [id number] ? ?Chief Complaint  ?Patient presents with  ? Follow-up  ? ? ?Referring provider: ?Alvester Chou, NP ? 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  ?Followed by palliative care at home ?History of atypical pneumonia with Pseudomonas.  Previous history of pneumothorax. ?Medical history significant for polysubstance abuse with cocaine, congestive heart failure, hepatitis C, diabetes. ? ?TEST/EVENTS :  ?home vent settings  ?Vent Mode: PRVC ?FiO2 (%): 40 % ?S RR: 16 ?S VT: 450 mL ?PEEP: 5 cm H20  ? ?08/17/2021 Follow up : COPD , O2 RF , Chronic Trach , Nocturnal Vent  ?Patient returns for follow-up visit.  Last seen January 2022.  Patient has underlying very severe COPD.  This is complicated by chronic hypoxic and hypercarbic respiratory failure.  She has a chronic trach and vent with oxygen 4l/m   She is maintained on Pulmicort twice daily and DuoNeb 4  times daily. ?Since last visit patient says she is doing okay . Breathing is doing about the same. Gets winded with activities .  Says she has good and bad days.  Has episodes where she gets short of breath with minimal activities.  She has been treated with prednisone frequently and is now on daily prednisone. ?Had COPD flare 1 month ago , treated with prednisone for 5 days . Symptoms resolved and went back to baseline. Has been on chronic prednisone for last year , currently on prednisone 63m daily .  ?Weight has trended up over 100lbs last 5 yrs. Has good appetite .  ? ?Goes to ENT every 3 months for trach changes.  ?Daughter helps her at home.  ?Mainly in wheelchair, can walk few steps and transfer.  ?In bed most days .  ?Has daily nurse for 8hrs . Helps with bathing and care .  ?Eats regular food. No feeding tube now . Denies dysphagia . Previous G Tube, removed 2  yrs ago .  ?No hospitalizations since last ov .  ?No recent antibiotics.  ? ? ?Allergies  ?Allergen Reactions  ? Amoxicillin   ?  Patient has tolerated cephalosporins in the past  ? Ativan [Lorazepam]   ?  Makes anxiety worse   ? ? ?Immunization History  ?Administered Date(s) Administered  ? Influenza,inj,Quad PF,6+ Mos 03/15/2017, 06/23/2020  ? Influenza-Unspecified 05/04/2016, 03/06/2019  ? PFIZER(Purple Top)SARS-COV-2 Vaccination 02/17/2020, 03/08/2020  ? Pneumococcal Conjugate-13 12/22/2017  ? Tdap 07/18/2016  ? ? ?Past Medical History:  ?Diagnosis Date  ? Allergy   ? Anxiety   ? Asthma   ? [facility] ([facility]estive heart failure) ([facility]   ? Cocaine abuse ([facility]   ? COPD (chronic obstructive pulmonary disease) ([facility]   ? Coronary artery disease   ? Emphysema/COPD ([facility]   ? Hepatitis C   ? High cholesterol   ? Hypertension   ? Pneumothorax   ? Tobacco abuse   ? ? ?Tobacco History: ?Social History  ? ?Tobacco Use  ?Smoking Status Former  ? Packs/day: 0.10  ? Years: 41.00  ? Pack years: 4.10  ? Types: Cigarettes  ? Quit date: 06/09/2017  ? Years since quitting: 4.1  ?Smokeless Tobacco Never  ? ?Counseling given: Not Answered ? ? ?Outpatient Medications Prior to Visit  ?Medication Sig Dispense Refill  ? ACCU-CHEK GUIDE test strip 4 (four) times daily. for testing    ?  aspirin 81 MG chewable tablet Chew 81 mg by mouth daily.     ? atorvastatin (LIPITOR) 40 MG tablet Take 40 mg by mouth at bedtime.    ? azelastine (ASTELIN) 0.1 % nasal spray SMARTSIG:1-2 Spray(s) Both Nares Every 12 Hours PRN    ? blood glucose meter kit and supplies KIT Dispense based on patient and insurance preference. Use up to four times daily as directed. (FOR ICD-9 250.00, 250.01). 1 each 0  ? budesonide (PULMICORT) 0.25 MG/2ML nebulizer solution Take 2 mLs (0.25 mg total) by nebulization 2 (two) times daily. (Patient taking differently: Take 0.5 mg by nebulization 2 (two) times daily.) 60 mL 12  ? clonazePAM (KLONOPIN) 0.5 MG tablet Take 0.5 mg by mouth in  the morning, at noon, and at bedtime.    ? clonazePAM (KLONOPIN) 2 MG tablet Take by mouth.    ? cloNIDine (CATAPRES - DOSED IN MG/24 HR) 0.1 mg/24hr patch Place 0.1 mg onto the skin once a week.    ? Ferrous Sulfate (IRON) 325 (65 Fe) MG TABS Take 1 tablet by mouth every other day.    ? fluconazole (DIFLUCAN) 150 MG tablet Take 1 tablet day 1 repeat in 1 week 2 tablet 0  ? fluticasone (FLONASE) 50 MCG/ACT nasal spray Place 1 spray into both nostrils daily.     ? gabapentin (NEURONTIN) 100 MG capsule Take 100 mg by mouth 3 (three) times daily. One tablet in am and mid day, and 3 at bedtime.    ? guaiFENesin (MUCINEX) 600 MG 12 hr tablet Take 600 mg by mouth 2 (two) times daily.    ? insulin detemir (LEVEMIR) 100 UNIT/ML FlexPen Inject 14 Units into the skin at bedtime.    ? Insulin Pen Needle (PEN NEEDLES) 32G X 6 MM MISC 1 each by Does not apply route 4 (four) times daily - after meals and at bedtime. 100 each 0  ? ipratropium-albuterol (DUONEB) 0.5-2.5 (3) MG/3ML SOLN Take 3 mLs by nebulization every 6 (six) hours. (Patient taking differently: Take 3 mLs by nebulization every 6 (six) hours as needed (for shortness of breath).) 360 mL 3  ? mirtazapine (REMERON SOL-TAB) 30 MG disintegrating tablet Take 1 tablet (30 mg total) by mouth at bedtime. 30 tablet 0  ? mirtazapine (REMERON) 30 MG tablet Take 30 mg by mouth at bedtime.    ? mupirocin ointment (BACTROBAN) 2 % Apply 1 application topically daily. To inside nose and to affected area of axilla 22 g 1  ? NOVOLOG FLEXPEN 100 UNIT/ML FlexPen Inject into the skin 3 (three) times daily with meals. Sliding scale mealtime insulin.    ? nystatin cream (MYCOSTATIN) Apply topically.    ? PARoxetine (PAXIL) 20 MG tablet Take by mouth.    ? polyethylene glycol powder (GLYCOLAX/MIRALAX) 17 GM/SCOOP powder Take by mouth.    ? promethazine (PHENERGAN) 12.5 MG tablet Take by mouth.    ? PULMICORT 0.5 MG/2ML nebulizer solution SMARTSIG:1 Via Nebulizer Twice Daily    ? terbinafine  (LAMISIL) 1 % cream Apply 1 application topically 2 (two) times daily. To affected skin, for 1 week after resolution of rash. # 30 gm, 1 refill.    ? Vitamin D, Ergocalciferol, (DRISDOL) 1.25 MG (50000 UNIT) CAPS capsule Take 50,000 Units by mouth 2 (two) times a week.    ? ?No facility-administered medications prior to visit.  ? ? ? ?Review of Systems:  ? ?Constitutional:   No  weight loss, night sweats,  Fevers, chills,  ?+fatigue,  or  lassitude. ? ?HEENT:   No headaches,  Difficulty swallowing,  Tooth/dental problems, or  Sore throat,  ?              No sneezing, itching, ear ache, nasal congestion, post nasal drip,  ? ?CV:  No chest pain,  Orthopnea, PND, swelling in lower extremities, anasarca, dizziness, palpitations, syncope.  ? ?GI  No heartburn, indigestion, abdominal pain, nausea, vomiting, diarrhea, change in bowel habits, loss of appetite, bloody stools.  ? ?Resp: No excess mucus, no productive cough,  No non-productive cough,  No coughing up of blood.  No change in color of mucus.  No wheezing.  No chest wall deformity ? ?Skin: no rash or lesions. ? ?GU: no dysuria, change in color of urine, no urgency or frequency.  No flank pain, no hematuria  ? ?MS:  No joint pain or swelling.  No decreased range of motion.  No back pain. ? ? ? ?Physical Exam ? ?BP 128/84 (BP Location: Left Arm, Patient Position: Sitting, Cuff Size: Normal)   Pulse 80   Temp 97.7 ?F (36.5 ?C) (Oral)   Ht 5' 2"  (1.575 m)   Wt 180 lb (81.6 kg)   LMP 03/06/2005 (Approximate)   SpO2 100%   BMI 32.92 kg/m?  ? ?GEN: A/Ox3; pleasant , NAD chronically ill-appearing, in wheelchair on ventilator with oxygen ?  ?HEENT:  [place]/AT,   NOSE-clear, THROAT-clear, no lesions, no postnasal drip or exudate noted.   ?NECK:  Supple w/ fair ROM; no JVD; normal carotid impulses w/o bruits; no thyromegaly or nodules palpated; no lymphadenopathy.  Midline trach, stoma without redness or drainage.  Trach dressing clean and dry ? ?RESP diminished breath  sounds in the bases no accessory muscle use, no dullness to percussion ? ?CARD:  RRR, no m/r/g, no peripheral edema, pulses intact, no cyanosis or clubbing. ? ?GI:   Soft & nt; nml bowel sounds; no organomegal

## 2021-08-17 NOTE — Patient Instructions (Signed)
Continue on Pulmicort Neb Twice daily   ?Continue on Duoneb Four times a day  .  ?Continue on Vent all the time.  ?Continue with Trach care .  ?Activity as tolerated. Up to chair daily as able .  ?Continue on Oxygen 4l/m  ?Chest xray today  ?Decrease Prednisone '20mg'$  alternating with '15mg'$  for 1 week then '15mg'$  daily for 1 week and then '15mg'$  alternating with '10mg'$  daily for 1 week and then hold '10mg'$  daily .  ?Follow up with Dr. Mortimer Fries in 4 weeks and As needed   ?Please contact office for sooner follow up if symptoms do not improve or worsen or seek emergency care  ? ?

## 2021-08-25 NOTE — Telephone Encounter (Signed)
Error

## 2021-09-13 ENCOUNTER — Telehealth: Payer: Self-pay | Admitting: Adult Health

## 2021-09-13 NOTE — Telephone Encounter (Signed)
Called and spoke with [name] who stated that pt was not having any air movement on the right side of chest and said that pt was also coughing up green phlegm. She also stated that when pt gets up to move, she is extremely SOB. Advised [name] that pt should be evaluated at either UC or ED and she verbalized understanding. Nothing further needed. ?

## 2021-09-29 ENCOUNTER — Inpatient Hospital Stay
Admission: EM | Admit: 2021-09-29 | Discharge: 2021-10-05 | DRG: 870 | Disposition: A | Discharge status: Home Health | Payer: Medicaid Other | Attending: Internal Medicine | Admitting: Internal Medicine

## 2021-09-29 ENCOUNTER — Other Ambulatory Visit: Payer: Self-pay

## 2021-09-29 ENCOUNTER — Emergency Department: Payer: Medicaid Other

## 2021-09-29 DIAGNOSIS — Z823 Family history of stroke: Secondary | ICD-10-CM

## 2021-09-29 DIAGNOSIS — Z888 Allergy status to other drugs, medicaments and biological substances status: Secondary | ICD-10-CM

## 2021-09-29 DIAGNOSIS — Z8249 Family history of ischemic heart disease and other diseases of the circulatory system: Secondary | ICD-10-CM

## 2021-09-29 DIAGNOSIS — A401 Sepsis due to streptococcus, group B: Principal | ICD-10-CM | POA: Diagnosis present

## 2021-09-29 DIAGNOSIS — J9621 Acute and chronic respiratory failure with hypoxia: Secondary | ICD-10-CM | POA: Diagnosis present

## 2021-09-29 DIAGNOSIS — J95851 Ventilator associated pneumonia: Secondary | ICD-10-CM | POA: Diagnosis present

## 2021-09-29 DIAGNOSIS — F419 Anxiety disorder, unspecified: Secondary | ICD-10-CM | POA: Diagnosis present

## 2021-09-29 DIAGNOSIS — E1169 Type 2 diabetes mellitus with other specified complication: Secondary | ICD-10-CM | POA: Diagnosis present

## 2021-09-29 DIAGNOSIS — Z79899 Other long term (current) drug therapy: Secondary | ICD-10-CM

## 2021-09-29 DIAGNOSIS — R1084 Generalized abdominal pain: Secondary | ICD-10-CM | POA: Diagnosis present

## 2021-09-29 DIAGNOSIS — F418 Other specified anxiety disorders: Secondary | ICD-10-CM | POA: Diagnosis present

## 2021-09-29 DIAGNOSIS — Z87891 Personal history of nicotine dependence: Secondary | ICD-10-CM

## 2021-09-29 DIAGNOSIS — I5032 Chronic diastolic (congestive) heart failure: Secondary | ICD-10-CM | POA: Diagnosis present

## 2021-09-29 DIAGNOSIS — Z825 Family history of asthma and other chronic lower respiratory diseases: Secondary | ICD-10-CM

## 2021-09-29 DIAGNOSIS — I110 Hypertensive heart disease with heart failure: Secondary | ICD-10-CM | POA: Diagnosis present

## 2021-09-29 DIAGNOSIS — J439 Emphysema, unspecified: Secondary | ICD-10-CM | POA: Diagnosis present

## 2021-09-29 DIAGNOSIS — Z9911 Dependence on respirator [ventilator] status: Secondary | ICD-10-CM

## 2021-09-29 DIAGNOSIS — J9622 Acute and chronic respiratory failure with hypercapnia: Secondary | ICD-10-CM | POA: Diagnosis present

## 2021-09-29 DIAGNOSIS — Z880 Allergy status to penicillin: Secondary | ICD-10-CM

## 2021-09-29 DIAGNOSIS — Z794 Long term (current) use of insulin: Secondary | ICD-10-CM

## 2021-09-29 DIAGNOSIS — J9600 Acute respiratory failure, unspecified whether with hypoxia or hypercapnia: Secondary | ICD-10-CM

## 2021-09-29 DIAGNOSIS — Z930 Tracheostomy status: Secondary | ICD-10-CM

## 2021-09-29 DIAGNOSIS — Z7982 Long term (current) use of aspirin: Secondary | ICD-10-CM

## 2021-09-29 DIAGNOSIS — Z803 Family history of malignant neoplasm of breast: Secondary | ICD-10-CM

## 2021-09-29 DIAGNOSIS — J9620 Acute and chronic respiratory failure, unspecified whether with hypoxia or hypercapnia: Secondary | ICD-10-CM | POA: Diagnosis present

## 2021-09-29 DIAGNOSIS — Z801 Family history of malignant neoplasm of trachea, bronchus and lung: Secondary | ICD-10-CM

## 2021-09-29 DIAGNOSIS — Z66 Do not resuscitate: Secondary | ICD-10-CM | POA: Diagnosis present

## 2021-09-29 DIAGNOSIS — B1920 Unspecified viral hepatitis C without hepatic coma: Secondary | ICD-10-CM | POA: Diagnosis present

## 2021-09-29 DIAGNOSIS — I2510 Atherosclerotic heart disease of native coronary artery without angina pectoris: Secondary | ICD-10-CM | POA: Diagnosis present

## 2021-09-29 DIAGNOSIS — Z7951 Long term (current) use of inhaled steroids: Secondary | ICD-10-CM

## 2021-09-29 DIAGNOSIS — E1165 Type 2 diabetes mellitus with hyperglycemia: Secondary | ICD-10-CM | POA: Diagnosis present

## 2021-09-29 DIAGNOSIS — Z20822 Contact with and (suspected) exposure to covid-19: Secondary | ICD-10-CM | POA: Diagnosis present

## 2021-09-29 DIAGNOSIS — J441 Chronic obstructive pulmonary disease with (acute) exacerbation: Secondary | ICD-10-CM

## 2021-09-29 DIAGNOSIS — E7800 Pure hypercholesterolemia, unspecified: Secondary | ICD-10-CM | POA: Diagnosis present

## 2021-09-29 MED ORDER — IPRATROPIUM-ALBUTEROL 0.5-2.5 (3) MG/3ML IN SOLN
3.0000 mL | Freq: Once | RESPIRATORY_TRACT | Status: AC
  Administered 2021-09-30: 3 mL via RESPIRATORY_TRACT

## 2021-09-29 MED ORDER — LACTATED RINGERS IV BOLUS (SEPSIS)
1000.0000 mL | Freq: Once | INTRAVENOUS | Status: AC
  Administered 2021-09-30: 1000 mL via INTRAVENOUS

## 2021-09-29 MED ORDER — MAGNESIUM SULFATE 2 GM/50ML IV SOLN
2.0000 g | Freq: Once | INTRAVENOUS | Status: AC
  Administered 2021-09-30: 2 g via INTRAVENOUS
  Filled 2021-09-29: qty 50

## 2021-09-29 MED ORDER — IPRATROPIUM-ALBUTEROL 0.5-2.5 (3) MG/3ML IN SOLN
3.0000 mL | Freq: Once | RESPIRATORY_TRACT | Status: AC
  Administered 2021-09-30: 3 mL via RESPIRATORY_TRACT
  Filled 2021-09-29: qty 9

## 2021-09-29 MED ORDER — LACTATED RINGERS IV BOLUS (SEPSIS)
500.0000 mL | Freq: Once | INTRAVENOUS | Status: AC
  Administered 2021-09-30: 500 mL via INTRAVENOUS

## 2021-09-29 MED ORDER — VANCOMYCIN HCL IN DEXTROSE 1-5 GM/200ML-% IV SOLN
1000.0000 mg | Freq: Once | INTRAVENOUS | Status: DC
  Filled 2021-09-29: qty 200

## 2021-09-29 MED ORDER — METHYLPREDNISOLONE SODIUM SUCC 125 MG IJ SOLR
125.0000 mg | Freq: Once | INTRAMUSCULAR | Status: AC
  Administered 2021-09-30: 125 mg via INTRAVENOUS
  Filled 2021-09-29: qty 2

## 2021-09-29 MED ORDER — LEVOFLOXACIN IN D5W 750 MG/150ML IV SOLN
750.0000 mg | Freq: Once | INTRAVENOUS | Status: AC
  Administered 2021-09-30: 750 mg via INTRAVENOUS
  Filled 2021-09-29: qty 150

## 2021-09-29 NOTE — ED Triage Notes (Signed)
Pt arrives in resp distress, pt has a trach, arrives on home vent. Md at bedside, perems pt with fever at home of 102.2 oral, hr in 140s. Pt alert and oriented x3. ?

## 2021-09-29 NOTE — ED Provider Notes (Signed)
? ?[facility] ?Provider Note ? ? ? Event Date/Time  ? First MD Initiated Contact with Patient 09/29/21 2350   ?  (approximate) ? ? ?History  ? ?Respiratory Distress ? ? ?HPI ? ?Michelle [name] is a 61 y.o. female brought to the ED via EMS from home with a chief complaint of respiratory distress.  Patient with a history of CHF, COPD with tracheostomy in place on home vent.  Reports fever at home to 102.2 ?F, increased cough and shortness of breath x1 day.  Arrives to the ED also with nonrebreather mask in place, tripoding, guppy breathing.  Has DNR form.  Endorses chest tightness.  Denies abdominal pain, nausea, vomiting or diarrhea. ?  ? ? ?Past Medical History  ? ?Past Medical History:  ?Diagnosis Date  ? Allergy   ? Anxiety   ? Asthma   ? CHF (congestive heart failure) ([place])   ? Cocaine abuse ([place])   ? COPD (chronic obstructive pulmonary disease) ([place])   ? Coronary artery disease   ? Emphysema/COPD [facility])   ? Hepatitis C   ? High cholesterol   ? Hypertension   ? Pneumothorax   ? Tobacco abuse   ? ? ? ?Active Problem List  ? ?Patient Active Problem List  ? Diagnosis Date Noted  ? Acute on chronic respiratory failure ([place]) 09/30/2021  ? Urine retention 02/07/2021  ? Candidiasis of breast 11/15/2020  ? Atypical pneumonia 08/29/2019  ? HCAP (healthcare-associated pneumonia) 08/28/2019  ? Pseudomonas respiratory infection 07/03/2019  ? Depression with anxiety 07/03/2019  ? Hyperlipidemia associated with type 2 diabetes mellitus ([place]) 07/03/2019  ? PEG (percutaneous endoscopic gastrostomy) status ([place]) 07/03/2019  ? Chronic pain 07/03/2019  ? Hypokalemia 07/03/2019  ? DM (diabetes mellitus) ([place]) 06/24/2019  ? Chronic respiratory failure requiring continuous mechanical ventilation through tracheostomy ([place]) 10/20/2018  ? CAD (coronary artery disease) 10/20/2018  ? HTN (hypertension) 10/20/2018  ? Chronic diastolic CHF (congestive heart failure) ([place]) 10/20/2018  ? Pneumonia 09/17/2018  ? Panic  disorder 10/29/2017  ? Altered mental status   ? Pressure injury of skin 10/23/2017  ? Acute on chronic respiratory failure with hypoxia and hypercapnia ([place]) 06/29/2017  ? Acute on chronic respiratory failure with hypoxia ([place]) 02/23/2017  ? Protein-calorie malnutrition, severe 05/23/2016  ? Sepsis ([place]) 05/22/2016  ? Malnutrition of moderate degree ([place]) 10/25/2014  ? COPD exacerbation ([place]) 10/24/2014  ? Tobacco abuse 10/24/2014  ? Community acquired pneumonia 10/24/2014  ? ? ? ?Past Surgical History  ? ?Past Surgical History:  ?Procedure Laterality Date  ? CARDIAC CATHETERIZATION    ? CHEST TUBE INSERTION    ? IR GASTROSTOMY TUBE MOD SED  10/31/2017  ? TRACHEOSTOMY TUBE PLACEMENT N/A 10/17/2017  ? Procedure: TRACHEOSTOMY;  Surgeon: Carloyn Manner, MD;  Location: [facility] ORS;  Service: ENT;  Laterality: N/A;  ? ? ? ?Home Medications  ? ?Prior to Admission medications   ?Medication Sig Start Date End Date Taking? Authorizing Provider  ?ACCU-CHEK GUIDE test strip 4 (four) times daily. for testing 03/01/21   [provider]  ?aspirin 81 MG chewable tablet Chew 81 mg by mouth daily.     [provider]  ?atorvastatin (LIPITOR) 40 MG tablet Take 40 mg by mouth at bedtime. 01/25/21   [provider]  ?azelastine (ASTELIN) 0.1 % nasal spray SMARTSIG:1-2 Spray(s) Both Nares Every 12 Hours PRN 09/22/20   [provider]  ?blood glucose meter kit and supplies KIT Dispense based on patient and insurance preference. Use  up to four times daily as directed. (FOR ICD-9 250.00, 250.01). 07/05/19   Nicole Kindred A, DO  ?budesonide (PULMICORT) 0.25 MG/2ML nebulizer solution Take 2 mLs (0.25 mg total) by nebulization 2 (two) times daily. ?Patient taking differently: Take 0.5 mg by nebulization 2 (two) times daily. 12/31/17   Conforti, John, DO  ?clonazePAM (KLONOPIN) 0.5 MG tablet Take 0.5 mg by mouth in the morning, at noon, and at bedtime.    [provider]  ?clonazePAM (KLONOPIN) 2 MG  tablet Take by mouth.    [provider]  ?cloNIDine (CATAPRES - DOSED IN MG/24 HR) 0.1 mg/24hr patch Place 0.1 mg onto the skin once a week.    [provider]  ?Ferrous Sulfate (IRON) 325 (65 Fe) MG TABS Take 1 tablet by mouth every other day. 01/25/21   [provider]  ?fluconazole (DIFLUCAN) 150 MG tablet Take 1 tablet day 1 repeat in 1 week 12/26/20   Ralene Bathe, MD  ?fluticasone [facility]) 50 MCG/ACT nasal spray Place 1 spray into both nostrils daily.     [provider]  ?gabapentin (NEURONTIN) 100 MG capsule Take 100 mg by mouth 3 (three) times daily. One tablet in am and mid day, and 3 at bedtime.    [provider]  ?guaiFENesin (MUCINEX) 600 MG 12 hr tablet Take 600 mg by mouth 2 (two) times daily.    [provider]  ?insulin detemir (LEVEMIR) 100 UNIT/ML FlexPen Inject 14 Units into the skin at bedtime.    [provider]  ?Insulin Pen Needle (PEN NEEDLES) 32G X 6 MM MISC 1 each by Does not apply route 4 (four) times daily - after meals and at bedtime. 07/05/19   Nicole Kindred A, DO  ?ipratropium-albuterol (DUONEB) 0.5-2.5 (3) MG/3ML SOLN Take 3 mLs by nebulization every 6 (six) hours. ?Patient taking differently: Take 3 mLs by nebulization every 6 (six) hours as needed (for shortness of breath). 12/31/17   Conforti, John, DO  ?mirtazapine (REMERON SOL-TAB) 30 MG disintegrating tablet Take 1 tablet (30 mg total) by mouth at bedtime. 07/05/19   Ezekiel Slocumb, DO  ?mirtazapine (REMERON) 30 MG tablet Take 30 mg by mouth at bedtime. 01/19/21   [provider]  ?mupirocin ointment (BACTROBAN) 2 % Apply 1 application topically daily. To inside nose and to affected area of axilla 12/01/20   Moye, [place], MD  ?NOVOLOG FLEXPEN 100 UNIT/ML FlexPen Inject into the skin 3 (three) times daily with meals. Sliding scale mealtime insulin. 03/01/21   [provider]  ?nystatin cream (MYCOSTATIN) Apply topically. 10/06/20 10/06/21   [provider]  ?PARoxetine (PAXIL) 20 MG tablet Take by mouth. 09/07/19   [provider]  ?polyethylene glycol powder (GLYCOLAX/MIRALAX) 17 GM/SCOOP powder Take by mouth.    [provider]  ?promethazine (PHENERGAN) 12.5 MG tablet Take by mouth. 12/06/20   [provider]  ?PULMICORT 0.5 MG/2ML nebulizer solution SMARTSIG:1 Via Nebulizer Twice Daily 02/13/21   [provider]  ?terbinafine (LAMISIL) 1 % cream Apply 1 application topically 2 (two) times daily. To affected skin, for 1 week after resolution of rash. # 30 gm, 1 refill.    [provider]  ?Vitamin D, Ergocalciferol, (DRISDOL) 1.25 MG (50000 UNIT) CAPS capsule Take 50,000 Units by mouth 2 (two) times a week.    [provider]  ?fluconazole (DIFLUCAN) 150 MG tablet Take 1 tablet day 1 repeat in 1 week 11/15/20   Laurence Ferrari, [place], MD  ? ? ? ?Allergies  ?  Amoxicillin and Ativan [lorazepam] ? ? ?Family History  ? ?Family History  ?Problem Relation Age of Onset  ? Lung cancer Mother   ? Asthma Mother   ? CVA Father   ? CAD Father   ? Stroke Father   ? Breast cancer Maternal Aunt 67  ? COPD Sister   ? ? ? ?Physical Exam  ?Triage Vital Signs: ?ED Triage Vitals  ?Enc Vitals Group  ?   BP   ?   Pulse   ?   Resp   ?   Temp   ?   Temp src   ?   SpO2   ?   Weight   ?   Height   ?   Head Circumference   ?   Peak Flow   ?   Pain Score   ?   Pain Loc   ?   Pain Edu?   ?   Excl. in [place]?   ? ? ?Updated Vital Signs: ?BP (!) 148/99   Pulse 90   Temp 98.8 ?F (37.1 ?C) (Oral)   Resp 20   Ht _0  (1.575 m)   Wt 73.5 kg   LMP 03/06/2005 (Approximate)   SpO2 97%   BMI 29.63 kg/m?  ? ? ?General: Awake, moderate distress.  ?CV:  Tachycardic.  Good peripheral perfusion.  ?Resp:  Increased effort.  Tripoding.  Guppy breathing.  Diminished diffusely. ?Abd:  Nontender.  No distention.  ?Other:  No pain or swelling to calves. ? ? ?ED Results / Procedures / Treatments  ?Labs ?(all labs ordered are listed, but only  abnormal results are displayed) ?Labs Reviewed  ?LACTIC ACID, PLASMA - Abnormal; Notable for the following components:  ?    Result Value  ? Lactic Acid, Venous 2.1 (*)   ? All other components within normal limits  ?COMPREHENSI

## 2021-09-30 ENCOUNTER — Inpatient Hospital Stay: Payer: Medicaid Other

## 2021-09-30 ENCOUNTER — Emergency Department: Payer: Medicaid Other

## 2021-09-30 DIAGNOSIS — I2510 Atherosclerotic heart disease of native coronary artery without angina pectoris: Secondary | ICD-10-CM | POA: Diagnosis present

## 2021-09-30 DIAGNOSIS — I5032 Chronic diastolic (congestive) heart failure: Secondary | ICD-10-CM | POA: Diagnosis present

## 2021-09-30 DIAGNOSIS — B1920 Unspecified viral hepatitis C without hepatic coma: Secondary | ICD-10-CM | POA: Diagnosis present

## 2021-09-30 DIAGNOSIS — J9622 Acute and chronic respiratory failure with hypercapnia: Secondary | ICD-10-CM | POA: Diagnosis present

## 2021-09-30 DIAGNOSIS — J441 Chronic obstructive pulmonary disease with (acute) exacerbation: Secondary | ICD-10-CM | POA: Diagnosis present

## 2021-09-30 DIAGNOSIS — J9621 Acute and chronic respiratory failure with hypoxia: Secondary | ICD-10-CM | POA: Diagnosis present

## 2021-09-30 DIAGNOSIS — Z79899 Other long term (current) drug therapy: Secondary | ICD-10-CM | POA: Diagnosis not present

## 2021-09-30 DIAGNOSIS — J9620 Acute and chronic respiratory failure, unspecified whether with hypoxia or hypercapnia: Secondary | ICD-10-CM | POA: Diagnosis present

## 2021-09-30 DIAGNOSIS — E1165 Type 2 diabetes mellitus with hyperglycemia: Secondary | ICD-10-CM | POA: Diagnosis present

## 2021-09-30 DIAGNOSIS — Z9911 Dependence on respirator [ventilator] status: Secondary | ICD-10-CM | POA: Diagnosis not present

## 2021-09-30 DIAGNOSIS — Z8249 Family history of ischemic heart disease and other diseases of the circulatory system: Secondary | ICD-10-CM | POA: Diagnosis not present

## 2021-09-30 DIAGNOSIS — Z20822 Contact with and (suspected) exposure to covid-19: Secondary | ICD-10-CM | POA: Diagnosis present

## 2021-09-30 DIAGNOSIS — A401 Sepsis due to streptococcus, group B: Secondary | ICD-10-CM | POA: Diagnosis not present

## 2021-09-30 DIAGNOSIS — F419 Anxiety disorder, unspecified: Secondary | ICD-10-CM | POA: Diagnosis present

## 2021-09-30 DIAGNOSIS — J95851 Ventilator associated pneumonia: Secondary | ICD-10-CM | POA: Diagnosis present

## 2021-09-30 DIAGNOSIS — Z930 Tracheostomy status: Secondary | ICD-10-CM | POA: Diagnosis not present

## 2021-09-30 DIAGNOSIS — J9600 Acute respiratory failure, unspecified whether with hypoxia or hypercapnia: Secondary | ICD-10-CM | POA: Diagnosis not present

## 2021-09-30 DIAGNOSIS — Z794 Long term (current) use of insulin: Secondary | ICD-10-CM | POA: Diagnosis not present

## 2021-09-30 DIAGNOSIS — E1169 Type 2 diabetes mellitus with other specified complication: Secondary | ICD-10-CM | POA: Diagnosis present

## 2021-09-30 DIAGNOSIS — I110 Hypertensive heart disease with heart failure: Secondary | ICD-10-CM | POA: Diagnosis present

## 2021-09-30 DIAGNOSIS — R1084 Generalized abdominal pain: Secondary | ICD-10-CM | POA: Diagnosis present

## 2021-09-30 DIAGNOSIS — Z7951 Long term (current) use of inhaled steroids: Secondary | ICD-10-CM | POA: Diagnosis not present

## 2021-09-30 DIAGNOSIS — Z66 Do not resuscitate: Secondary | ICD-10-CM | POA: Diagnosis present

## 2021-09-30 DIAGNOSIS — J439 Emphysema, unspecified: Secondary | ICD-10-CM | POA: Diagnosis present

## 2021-09-30 DIAGNOSIS — E7800 Pure hypercholesterolemia, unspecified: Secondary | ICD-10-CM | POA: Diagnosis present

## 2021-09-30 DIAGNOSIS — Z7982 Long term (current) use of aspirin: Secondary | ICD-10-CM | POA: Diagnosis not present

## 2021-09-30 DIAGNOSIS — F418 Other specified anxiety disorders: Secondary | ICD-10-CM | POA: Diagnosis present

## 2021-09-30 LAB — COMPREHENSIVE METABOLIC PANEL
ALT: 15 U/L (ref 0–44)
AST: 19 U/L (ref 15–41)
Albumin: 4.3 g/dL (ref 3.5–5.0)
Alkaline Phosphatase: 130 U/L — ABNORMAL HIGH (ref 38–126)
Anion gap: 14 (ref 5–15)
BUN: 10 mg/dL (ref 6–20)
CO2: 19 mmol/L — ABNORMAL LOW (ref 22–32)
Calcium: 9.3 mg/dL (ref 8.9–10.3)
Chloride: 108 mmol/L (ref 98–111)
Creatinine, Ser: 0.61 mg/dL (ref 0.44–1.00)
GFR, Estimated: >60 mL/min (ref >60)
Glucose, Bld: 218 mg/dL — ABNORMAL HIGH (ref 70–99)
Potassium: 3.6 mmol/L (ref 3.5–5.1)
Sodium: 141 mmol/L (ref 135–145)
Total Bilirubin: 0.7 mg/dL (ref 0.3–1.2)
Total Protein: 8.6 g/dL — ABNORMAL HIGH (ref 6.5–8.1)

## 2021-09-30 LAB — BLOOD GAS, ARTERIAL
Acid-base deficit: 1.2 mmol/L (ref 0.0–2.0)
Acid-base deficit: 3.3 mmol/L — ABNORMAL HIGH (ref 0.0–2.0)
Bicarbonate: 23.2 mmol/L (ref 20.0–28.0)
Bicarbonate: 25.7 mmol/L (ref 20.0–28.0)
FIO2: 5 %
MECHVT: 450 mL
Mechanical Rate: 16
O2 Content: 5 L/min
O2 Saturation: 100 %
O2 Saturation: 95.4 %
PEEP: 5 cmH2O
Patient temperature: 37
Patient temperature: 37
pCO2 arterial: 46 mmHg (ref 32–48)
pCO2 arterial: 51 mmHg — ABNORMAL HIGH (ref 32–48)
pH, Arterial: 7.31 — ABNORMAL LOW (ref 7.35–7.45)
pH, Arterial: 7.31 — ABNORMAL LOW (ref 7.35–7.45)
pO2, Arterial: 155 mmHg — ABNORMAL HIGH (ref 83–108)
pO2, Arterial: 70 mmHg — ABNORMAL LOW (ref 83–108)

## 2021-09-30 LAB — URINALYSIS, COMPLETE (UACMP) WITH MICROSCOPIC
Bacteria, UA: NONE SEEN
Bilirubin Urine: NEGATIVE
Glucose, UA: >=500 mg/dL — AB
Hgb urine dipstick: NEGATIVE
Ketones, ur: 5 mg/dL — AB
Leukocytes,Ua: NEGATIVE
Nitrite: NEGATIVE
Protein, ur: NEGATIVE mg/dL
Specific Gravity, Urine: 1.015 (ref 1.005–1.030)
pH: 6 (ref 5.0–8.0)

## 2021-09-30 LAB — BLOOD CULTURE ID PANEL (REFLEXED) - BCID2

## 2021-09-30 LAB — CBC WITH DIFFERENTIAL/PLATELET
Abs Immature Granulocytes: 0.1 10*3/uL — ABNORMAL HIGH (ref 0.00–0.07)
Basophils Absolute: 0 10*3/uL (ref 0.0–0.1)
Basophils Relative: 0 %
Eosinophils Absolute: 0.1 10*3/uL (ref 0.0–0.5)
Eosinophils Relative: 1 %
HCT: 40.2 % (ref 36.0–46.0)
Hemoglobin: 12.3 g/dL (ref 12.0–15.0)
Immature Granulocytes: 1 %
Lymphocytes Relative: 8 %
Lymphs Abs: 1 10*3/uL (ref 0.7–4.0)
MCH: 25.7 pg — ABNORMAL LOW (ref 26.0–34.0)
MCHC: 30.6 g/dL (ref 30.0–36.0)
MCV: 84.1 fL (ref 80.0–100.0)
Monocytes Absolute: 0.6 10*3/uL (ref 0.1–1.0)
Monocytes Relative: 5 %
Neutro Abs: 10.4 10*3/uL — ABNORMAL HIGH (ref 1.7–7.7)
Neutrophils Relative %: 85 %
Platelets: 279 10*3/uL (ref 150–400)
RBC: 4.78 MIL/uL (ref 3.87–5.11)
RDW: 14.3 % (ref 11.5–15.5)
WBC: 12.2 10*3/uL — ABNORMAL HIGH (ref 4.0–10.5)
nRBC: 0 % (ref 0.0–0.2)

## 2021-09-30 LAB — CBC
HCT: 34.5 % — ABNORMAL LOW (ref 36.0–46.0)
Hemoglobin: 10.5 g/dL — ABNORMAL LOW (ref 12.0–15.0)
MCH: 25.9 pg — ABNORMAL LOW (ref 26.0–34.0)
MCHC: 30.4 g/dL (ref 30.0–36.0)
MCV: 85.2 fL (ref 80.0–100.0)
Platelets: 243 10*3/uL (ref 150–400)
RBC: 4.05 MIL/uL (ref 3.87–5.11)
RDW: 14.2 % (ref 11.5–15.5)
WBC: 12.8 10*3/uL — ABNORMAL HIGH (ref 4.0–10.5)
nRBC: 0 % (ref 0.0–0.2)

## 2021-09-30 LAB — MAGNESIUM
Magnesium: 1.9 mg/dL (ref 1.7–2.4)
Magnesium: 2 mg/dL (ref 1.7–2.4)

## 2021-09-30 LAB — PROCALCITONIN
Procalcitonin: <0.1 ng/mL
Procalcitonin: <0.1 ng/mL

## 2021-09-30 LAB — HIV ANTIBODY (ROUTINE TESTING W REFLEX): HIV Screen 4th Generation wRfx: NONREACTIVE

## 2021-09-30 LAB — HEMOGLOBIN A1C
Hgb A1c MFr Bld: 10.2 % — ABNORMAL HIGH (ref 4.8–5.6)
Mean Plasma Glucose: 246.04 mg/dL

## 2021-09-30 LAB — BASIC METABOLIC PANEL
Anion gap: 10 (ref 5–15)
BUN: 9 mg/dL (ref 6–20)
CO2: 22 mmol/L (ref 22–32)
Calcium: 8.4 mg/dL — ABNORMAL LOW (ref 8.9–10.3)
Chloride: 105 mmol/L (ref 98–111)
Creatinine, Ser: 0.7 mg/dL (ref 0.44–1.00)
GFR, Estimated: >60 mL/min (ref >60)
Glucose, Bld: 322 mg/dL — ABNORMAL HIGH (ref 70–99)
Potassium: 4.2 mmol/L (ref 3.5–5.1)
Sodium: 137 mmol/L (ref 135–145)

## 2021-09-30 LAB — TROPONIN I (HIGH SENSITIVITY)
Troponin I (High Sensitivity): 16 ng/L (ref <18)
Troponin I (High Sensitivity): 29 ng/L — ABNORMAL HIGH (ref <18)

## 2021-09-30 LAB — APTT: aPTT: 30 seconds (ref 24–36)

## 2021-09-30 LAB — PHOSPHORUS: Phosphorus: 3.9 mg/dL (ref 2.5–4.6)

## 2021-09-30 LAB — PROTIME-INR
INR: 1 (ref 0.8–1.2)
Prothrombin Time: 13.1 seconds (ref 11.4–15.2)

## 2021-09-30 LAB — GLUCOSE, CAPILLARY
Glucose-Capillary: 310 mg/dL — ABNORMAL HIGH (ref 70–99)
Glucose-Capillary: 282 mg/dL — ABNORMAL HIGH (ref 70–99)

## 2021-09-30 LAB — LACTIC ACID, PLASMA
Lactic Acid, Venous: 1.9 mmol/L (ref 0.5–1.9)
Lactic Acid, Venous: 2.1 mmol/L (ref 0.5–1.9)
Lactic Acid, Venous: 2.7 mmol/L (ref 0.5–1.9)

## 2021-09-30 LAB — RESP PANEL BY RT-PCR (FLU A&B, COVID) ARPGX2
Influenza A by PCR: NEGATIVE
Influenza B by PCR: NEGATIVE
SARS Coronavirus 2 by RT PCR: NEGATIVE

## 2021-09-30 LAB — CBG MONITORING, ED
Glucose-Capillary: 278 mg/dL — ABNORMAL HIGH (ref 70–99)
Glucose-Capillary: 289 mg/dL — ABNORMAL HIGH (ref 70–99)
Glucose-Capillary: 265 mg/dL — ABNORMAL HIGH (ref 70–99)

## 2021-09-30 LAB — BRAIN NATRIURETIC PEPTIDE: B Natriuretic Peptide: 45.9 pg/mL (ref 0.0–100.0)

## 2021-09-30 LAB — STREP PNEUMONIAE URINARY ANTIGEN: Strep Pneumo Urinary Antigen: NEGATIVE

## 2021-09-30 LAB — MRSA NEXT GEN BY PCR, NASAL: MRSA by PCR Next Gen: NOT DETECTED

## 2021-09-30 MED ORDER — CHLORHEXIDINE GLUCONATE 0.12% ORAL RINSE (MEDLINE KIT)
15.0000 mL | Freq: Two times a day (BID) | OROMUCOSAL | Status: DC
  Administered 2021-09-30 – 2021-10-05 (×9): 15 mL via OROMUCOSAL

## 2021-09-30 MED ORDER — ATORVASTATIN CALCIUM 20 MG PO TABS
40.0000 mg | ORAL_TABLET | Freq: Every day | ORAL | Status: DC
  Administered 2021-09-30 – 2021-10-04 (×5): 40 mg via ORAL
  Filled 2021-09-30 (×5): qty 2

## 2021-09-30 MED ORDER — INSULIN DETEMIR 100 UNIT/ML ~~LOC~~ SOLN
14.0000 [IU] | Freq: Every day | SUBCUTANEOUS | Status: DC
  Administered 2021-10-01 – 2021-10-03 (×4): 14 [IU] via SUBCUTANEOUS
  Filled 2021-09-30 (×5): qty 0.14

## 2021-09-30 MED ORDER — PAROXETINE HCL 20 MG PO TABS
20.0000 mg | ORAL_TABLET | Freq: Every day | ORAL | Status: DC
  Administered 2021-10-01 – 2021-10-05 (×5): 20 mg via ORAL
  Filled 2021-09-30 (×5): qty 1

## 2021-09-30 MED ORDER — BUDESONIDE 0.25 MG/2ML IN SUSP
0.2500 mg | Freq: Two times a day (BID) | RESPIRATORY_TRACT | Status: DC
Start: 2021-09-30 — End: 2021-10-06
  Administered 2021-09-30 – 2021-10-05 (×11): 0.25 mg via RESPIRATORY_TRACT
  Filled 2021-09-30 (×12): qty 2

## 2021-09-30 MED ORDER — ORAL CARE MOUTH RINSE
15.0000 mL | OROMUCOSAL | Status: DC
  Administered 2021-10-01 – 2021-10-05 (×23): 15 mL via OROMUCOSAL

## 2021-09-30 MED ORDER — FLUTICASONE PROPIONATE 50 MCG/ACT NA SUSP
1.0000 | Freq: Every day | NASAL | Status: DC | PRN
  Filled 2021-09-30: qty 16

## 2021-09-30 MED ORDER — ONDANSETRON HCL 4 MG/2ML IJ SOLN
4.0000 mg | Freq: Once | INTRAMUSCULAR | Status: AC
  Administered 2021-09-30: 4 mg via INTRAVENOUS
  Filled 2021-09-30: qty 2

## 2021-09-30 MED ORDER — QUETIAPINE FUMARATE 25 MG PO TABS: 50.0000 mg | ORAL_TABLET | Freq: Every day | ORAL | Status: DC

## 2021-09-30 MED ORDER — DIAZEPAM 5 MG/ML IJ SOLN
2.5000 mg | Freq: Four times a day (QID) | INTRAMUSCULAR | Status: DC | PRN
  Administered 2021-09-30 – 2021-10-05 (×12): 2.5 mg via INTRAVENOUS
  Filled 2021-09-30 (×12): qty 2

## 2021-09-30 MED ORDER — CLONAZEPAM 0.5 MG PO TABS
0.5000 mg | ORAL_TABLET | Freq: Three times a day (TID) | ORAL | Status: DC
  Administered 2021-09-30: 0.5 mg via ORAL
  Filled 2021-09-30: qty 1

## 2021-09-30 MED ORDER — VANCOMYCIN HCL 1750 MG/350ML IV SOLN
1750.0000 mg | Freq: Once | INTRAVENOUS | Status: AC
  Administered 2021-09-30: 1750 mg via INTRAVENOUS
  Filled 2021-09-30 (×2): qty 350

## 2021-09-30 MED ORDER — VANCOMYCIN HCL 1500 MG/300ML IV SOLN
1500.0000 mg | INTRAVENOUS | Status: DC
  Filled 2021-09-30: qty 300

## 2021-09-30 MED ORDER — IPRATROPIUM-ALBUTEROL 0.5-2.5 (3) MG/3ML IN SOLN
3.0000 mL | Freq: Four times a day (QID) | RESPIRATORY_TRACT | Status: DC
  Administered 2021-09-30 – 2021-10-05 (×22): 3 mL via RESPIRATORY_TRACT
  Filled 2021-09-30 (×23): qty 3

## 2021-09-30 MED ORDER — CHLORHEXIDINE GLUCONATE CLOTH 2 % EX PADS
6.0000 | MEDICATED_PAD | Freq: Every day | CUTANEOUS | Status: DC
  Administered 2021-10-02 – 2021-10-04 (×3): 6 via TOPICAL

## 2021-09-30 MED ORDER — ENOXAPARIN SODIUM 40 MG/0.4ML IJ SOSY
40.0000 mg | PREFILLED_SYRINGE | INTRAMUSCULAR | Status: DC
  Administered 2021-09-30 – 2021-10-05 (×6): 40 mg via SUBCUTANEOUS
  Filled 2021-09-30 (×6): qty 0.4

## 2021-09-30 MED ORDER — METHYLPREDNISOLONE SODIUM SUCC 40 MG IJ SOLR
40.0000 mg | Freq: Two times a day (BID) | INTRAMUSCULAR | Status: DC
  Administered 2021-09-30 – 2021-10-02 (×6): 40 mg via INTRAVENOUS
  Filled 2021-09-30 (×6): qty 1

## 2021-09-30 MED ORDER — GABAPENTIN 100 MG PO CAPS
100.0000 mg | ORAL_CAPSULE | Freq: Three times a day (TID) | ORAL | Status: DC
  Administered 2021-09-30 – 2021-10-05 (×17): 100 mg via ORAL
  Filled 2021-09-30 (×17): qty 1

## 2021-09-30 MED ORDER — MIRTAZAPINE 15 MG PO TBDP
30.0000 mg | ORAL_TABLET | Freq: Every day | ORAL | Status: DC
  Administered 2021-09-30 – 2021-10-04 (×5): 30 mg via ORAL
  Filled 2021-09-30 (×6): qty 2

## 2021-09-30 MED ORDER — CLONAZEPAM 0.5 MG PO TABS
0.5000 mg | ORAL_TABLET | Freq: Once | ORAL | Status: AC
  Administered 2021-09-30: 0.5 mg via ORAL
  Filled 2021-09-30: qty 1

## 2021-09-30 MED ORDER — INSULIN ASPART 100 UNIT/ML IJ SOLN
0.0000 [IU] | INTRAMUSCULAR | Status: DC
  Administered 2021-09-30: 7 [IU] via SUBCUTANEOUS
  Administered 2021-09-30 (×3): 11 [IU] via SUBCUTANEOUS
  Administered 2021-09-30: 15 [IU] via SUBCUTANEOUS
  Administered 2021-10-01 (×5): 7 [IU] via SUBCUTANEOUS
  Administered 2021-10-01: 4 [IU] via SUBCUTANEOUS
  Administered 2021-10-02 (×3): 7 [IU] via SUBCUTANEOUS
  Administered 2021-10-02: 3 [IU] via SUBCUTANEOUS
  Administered 2021-10-02: 5 [IU] via SUBCUTANEOUS
  Administered 2021-10-02: 15 [IU] via SUBCUTANEOUS
  Administered 2021-10-02 – 2021-10-03 (×2): 7 [IU] via SUBCUTANEOUS
  Administered 2021-10-03 (×4): 4 [IU] via SUBCUTANEOUS
  Administered 2021-10-04: 15 [IU] via SUBCUTANEOUS
  Administered 2021-10-04: 3 [IU] via SUBCUTANEOUS
  Administered 2021-10-04: 15 [IU] via SUBCUTANEOUS
  Administered 2021-10-04: 20 [IU] via SUBCUTANEOUS
  Administered 2021-10-04: 7 [IU] via SUBCUTANEOUS
  Administered 2021-10-04: 4 [IU] via SUBCUTANEOUS
  Administered 2021-10-05: 15 [IU] via SUBCUTANEOUS
  Administered 2021-10-05: 4 [IU] via SUBCUTANEOUS
  Filled 2021-09-30 (×29): qty 1

## 2021-09-30 MED ORDER — MORPHINE SULFATE (PF) 2 MG/ML IV SOLN
2.0000 mg | INTRAVENOUS | Status: DC | PRN
  Administered 2021-09-30 (×3): 2 mg via INTRAVENOUS
  Administered 2021-10-01: 4 mg via INTRAVENOUS
  Filled 2021-09-30: qty 1
  Filled 2021-09-30: qty 2
  Filled 2021-09-30 (×2): qty 1

## 2021-09-30 MED ORDER — PANTOPRAZOLE SODIUM 40 MG IV SOLR
40.0000 mg | Freq: Every day | INTRAVENOUS | Status: DC
  Administered 2021-09-30 – 2021-10-02 (×3): 40 mg via INTRAVENOUS
  Filled 2021-09-30 (×3): qty 10

## 2021-09-30 MED ORDER — SODIUM CHLORIDE 0.9 % IV SOLN: 2.0000 g | Freq: Two times a day (BID) | INTRAVENOUS | Status: DC

## 2021-09-30 MED ORDER — SODIUM CHLORIDE 0.9 % IV SOLN
2.0000 g | Freq: Three times a day (TID) | INTRAVENOUS | Status: DC
  Administered 2021-09-30 – 2021-10-02 (×8): 2 g via INTRAVENOUS
  Filled 2021-09-30 (×3): qty 12.5
  Filled 2021-09-30 (×2): qty 2
  Filled 2021-09-30 (×2): qty 12.5
  Filled 2021-09-30: qty 2
  Filled 2021-09-30: qty 12.5

## 2021-09-30 MED ORDER — MELATONIN 5 MG PO TABS
5.0000 mg | ORAL_TABLET | Freq: Every day | ORAL | Status: DC
  Administered 2021-09-30 – 2021-10-04 (×5): 5 mg via ORAL
  Filled 2021-09-30 (×5): qty 1

## 2021-09-30 MED ORDER — ASPIRIN 81 MG PO CHEW
81.0000 mg | CHEWABLE_TABLET | Freq: Every day | ORAL | Status: DC
  Administered 2021-09-30 – 2021-10-05 (×6): 81 mg via ORAL
  Filled 2021-09-30 (×6): qty 1

## 2021-09-30 MED ORDER — IPRATROPIUM-ALBUTEROL 0.5-2.5 (3) MG/3ML IN SOLN: 3.0000 mL | Freq: Four times a day (QID) | RESPIRATORY_TRACT | Status: DC

## 2021-09-30 MED ORDER — IOHEXOL 350 MG/ML SOLN
100.0000 mL | Freq: Once | INTRAVENOUS | Status: AC | PRN
  Administered 2021-09-30: 100 mL via INTRAVENOUS

## 2021-09-30 MED ORDER — GUAIFENESIN 100 MG/5ML PO LIQD
5.0000 mL | ORAL | Status: DC | PRN
  Administered 2021-10-01 – 2021-10-04 (×3): 5 mL via ORAL
  Filled 2021-09-30 (×3): qty 10

## 2021-09-30 MED ORDER — DOCUSATE SODIUM 100 MG PO CAPS: 100.0000 mg | ORAL_CAPSULE | Freq: Two times a day (BID) | ORAL | Status: DC | PRN

## 2021-09-30 MED ORDER — ALBUTEROL SULFATE (2.5 MG/3ML) 0.083% IN NEBU
2.5000 mg | INHALATION_SOLUTION | Freq: Four times a day (QID) | RESPIRATORY_TRACT | Status: DC | PRN
  Administered 2021-09-30 – 2021-10-02 (×3): 2.5 mg via RESPIRATORY_TRACT
  Filled 2021-09-30 (×3): qty 3

## 2021-09-30 MED ORDER — MORPHINE SULFATE (PF) 4 MG/ML IV SOLN
4.0000 mg | Freq: Once | INTRAVENOUS | Status: AC
  Administered 2021-09-30: 4 mg via INTRAVENOUS
  Filled 2021-09-30: qty 1

## 2021-09-30 MED ORDER — LABETALOL HCL 5 MG/ML IV SOLN
10.0000 mg | INTRAVENOUS | Status: DC | PRN
  Administered 2021-10-01: 10 mg via INTRAVENOUS
  Administered 2021-10-01 – 2021-10-02 (×2): 20 mg via INTRAVENOUS
  Administered 2021-10-02 – 2021-10-03 (×5): 10 mg via INTRAVENOUS
  Administered 2021-10-04: 20 mg via INTRAVENOUS
  Filled 2021-09-30 (×8): qty 4

## 2021-09-30 MED ORDER — GUAIFENESIN ER 600 MG PO TB12
600.0000 mg | ORAL_TABLET | Freq: Two times a day (BID) | ORAL | Status: DC
  Administered 2021-09-30: 600 mg via ORAL
  Filled 2021-09-30: qty 1

## 2021-09-30 MED ORDER — POLYETHYLENE GLYCOL 3350 17 G PO PACK: 17.0000 g | PACK | Freq: Every day | ORAL | Status: DC | PRN

## 2021-09-30 MED ORDER — FERROUS SULFATE 325 (65 FE) MG PO TABS
325.0000 mg | ORAL_TABLET | ORAL | Status: DC
  Administered 2021-10-02 – 2021-10-04 (×2): 325 mg via ORAL
  Filled 2021-09-30 (×2): qty 1

## 2021-09-30 NOTE — Progress Notes (Signed)
Pharmacy Electrolyte Monitoring Consult: ? ?Pharmacy consulted to assist in monitoring and replacing electrolytes in this 61 y.o. female admitted on 09/29/2021 with Respiratory Distress ? ? ?Labs: ? ?Sodium (mmol/L)  ?Date Value  ?09/30/2021 137  ?07/11/2014 139  ? ?Potassium (mmol/L)  ?Date Value  ?09/30/2021 4.2  ?07/11/2014 4.6  ? ?Magnesium (mg/dL)  ?Date Value  ?09/30/2021 2.0  ?10/31/2013 2.6 (H)  ? ?Phosphorus (mg/dL)  ?Date Value  ?09/30/2021 3.9  ?10/31/2013 4.6  ? ?Calcium (mg/dL)  ?Date Value  ?09/30/2021 8.4 (L)  ? ?Calcium, Total (mg/dL)  ?Date Value  ?07/11/2014 8.9  ? ?Albumin (g/dL)  ?Date Value  ?09/30/2021 4.3  ?07/09/2014 3.7  ? ? ?Assessment/Plan:SHOB, sepsis ?chronic tracheostomy from a previous COPD exacerbation.  She arrived at the ED with her home ventilator that she uses 24/7. Hx dM ? ?- no electrolyte replacement at this time ?-f/u electrolytes as ordered ? ?Michelle [name] A ?09/30/2021 ?11:58 AM ?                     ? ? ? ? ?  ?

## 2021-09-30 NOTE — Progress Notes (Signed)
Pharmacy Antibiotic Note ? ?EVIN [name] is a 61 y.o. female admitted on 09/29/2021 with sepsis, PNA.  Pharmacy has been consulted for Vanc, Cefepime dosing. ? ?Plan: ?Cefepime 2 gm IV Q8H ordered to start on 4/29 @ 0300.  ? ?Vancomycin 1750 mg IV X 1 ordered for 4/29 @ 0400. ?Vancomycin 1500 mg IV Q24H ordered to start 4/30 @ 0400.  ? ?AUC = 529.9 ?Vanc trough = 11.8  ? ?Height: '5\' 2"'$  (157.5 cm) ?Weight: 73.5 kg (162 lb) ?IBW/kg (Calculated) : 50.1 ? ?Temp (24hrs), Avg:98.8 ?F (37.1 ?C), Min:98.8 ?F (37.1 ?C), Max:98.8 ?F (37.1 ?C) ? ?Recent Labs  ?Lab 09/30/21 ?0001 09/30/21 ?9826  ?WBC  --  12.2*  ?CREATININE 0.61  --   ?LATICACIDVEN 1.9  --   ?  ?Estimated Creatinine Clearance: 70.2 mL/min (by C-G formula based on SCr of 0.61 mg/dL).   ? ?Allergies  ?Allergen Reactions  ? Amoxicillin   ?  Patient has tolerated cephalosporins in the past  ? Ativan [Lorazepam]   ?  Makes anxiety worse   ? ? ?Antimicrobials this admission: ?  >>  ?  >>  ? ?Dose adjustments this admission: ? ? ?Microbiology results: ? BCx:  ? UCx:   ? Sputum:   ? MRSA PCR:  ? ?Thank you for allowing pharmacy to be a part of this patientMichelles care. ? ?Aleksey Newbern D ?09/30/2021 2:55 AM ? ?

## 2021-09-30 NOTE — Progress Notes (Signed)
Sepsis tracking by eLINK 

## 2021-09-30 NOTE — Progress Notes (Signed)
RT note:  Patient on home vent with 5L bleed in (decreased to baseline 4L as SpO2 99%).  Non distressed and able to make needs known.  #6 shiley flex in place.  Trach care completed with good skin integrity underneath.  No inner cannula in trach, which patient stated was removed by daughter at home due to plugging concern.  New inner cannula placed.   ? ?Suctioned x2 with moderate return of tan secretions. ? ?Patient requesting to eat, states she is on regular diet at home.  Relayed to RN.   ? ?Left in no distress. ?

## 2021-09-30 NOTE — H&P (Signed)
? ?NAME:  Michelle [name], MRN:  [id number], DOB:  10-22-1960, LOS: 0 ?ADMISSION DATE:  09/29/2021, CONSULTATION DATE:  09/30/21 ?REFERRING MD:  Dr. Beather Arbour, CHIEF COMPLAINT:  Respiratory Distress ? ?History of Present Illness:  ?61 year old female presenting to [facility] ED from home via EMS on 09/29/2021 with complaints of respiratory distress.  Per ED documentation patient arrived with nonrebreather mask in place, tripoding and guppy breathing.  Per the patient's report which is confirmed by daughter who is bedside, she developed a sore throat with a dry cough and fever on 09/28/2021.  By 09/29/2021 the patient started becoming short of breath.  Daughter states she tried saline treatments which helped for a little bit but did not completely relieve the shortness of breath.  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  She arrived at the ED with her home ventilator that she uses 24/7.  ?Patient does admit to chest tightness on arrival, but reports this is gone now.  She continues to feel short of breath and states that what has helped the most is Klonopin and morphine.  She endorses generalized abdominal pain and tenderness to touch, but denies nausea vomiting or diarrhea.  Patient also confirms she saw her doctor at the beginning of the month with complaints of shortness of breath and a productive cough.  Daughter confirms the patient was prescribed doxycycline course which she completed. ? ?ED course: ?Septic protocol initiated.  RT suctioned and placed patient on home ventilator.  Differential includes respiratory virus, COPD exacerbation, PE, pneumonia, CHF exacerbation, PTX, ACS ?medications given: Clonazepam, DuoNebs x3, 2.5 L LR bolus, Levaquin/vancomycin, Solu-Medrol, Mg IV, morphine IV, Zofran IV ?Initial Vitals: 98.8, tachypneic at 22, tachycardic 139, hypertensive 196/97 and 96% on home vent ?Significant labs: (Labs/ Imaging personally reviewed) ?I, Domingo Pulse [place],  AGACNP-BC, personally viewed and interpreted this ECG. ?EKG Interpretation: Date: 09/29/21, EKG Time: 23:55, Rate: 139, Rhythm: ST with RBBB(baseline), QRS Axis:  RAD, Intervals: RBBB, LPFB (unchanged from previous), ST/T Wave abnormalities: none,  ?Narrative Interpretation: ST with RBBB ?Chemistry: Na+: 141, K+: 3.6, BUN/Cr.: 10/0.61, Serum CO2/ AG: 19/14, Mg: 1.9, alk phos: 130 ?Hematology: WBC: 12.2, Hgb: 12.3,  ?Troponin: 16>29, BNP: 45.9, Lactic/ PCT: 1.9/ <0.10,  ?COVID-19 & Influenza A/B: negative ?ABG: 7.31/ 51/ 70/ 25.7 ?CXR 09/30/21: Chronic appearing increased interstitial lung marking without evidence of acute or active cardiopulmonary disease ?CT angio 09/30/21: pending ?CT abdomen/pelvis w contrast 09/30/21: pending ? ?PCCM consulted for admission due to acute on chronic respiratory failure requiring mechanical ventilatory support via tracheostomy. ? ?Pertinent  Medical History  ?COPD ?CHF ?CAD ?Hep C ?Tobacco abuse ?Cocaine abuse ?Asthma ?Anxiety ? ?Significant Hospital Events: ?Including procedures, antibiotic start and stop dates in addition to other pertinent events   ?09/30/21: Admit to ICU on home ventilator with acute on chronic respiratory failure. ? ?Interim History / Subjective:  ?Patient alert and responsive, dyspneic with complaints of generalized abdominal pain. ? ?Objective   ?Blood pressure (!) 159/80, pulse (!) 128, temperature 98.8 ?F (37.1 ?C), temperature source Oral, resp. rate 20, height 5' 2"  (1.575 m), weight 73.5 kg, last menstrual period 03/06/2005, SpO2 97 %. ?   ?   ? ?Intake/Output Summary (Last 24 hours) at 09/30/2021 0208 ?Last data filed at 09/30/2021 0050 ?Gross per 24 hour  ?Intake 1000 ml  ?Output --  ?Net 1000 ml  ? ?Filed Weights  ? 09/29/21 2357  ?Weight: 73.5 kg  ? ? ?Examination: ?General: Adult female, critically ill, lying in  bed requiring mechanical ventilation via tracheostomy ?HEENT: MM pink/moist, anicteric, atraumatic, neck supple ?Neuro: A&O x 4, able to follow  commands, PERRL +3, MAE ?CV: s1s2 RRR, ST on monitor, no r/m/g ?Pulm: Regular, mild dyspnea and labored on home mechanical ventilation , breath sounds diminished throughout ?GI: soft, rounded, generalized tenderness to light palpation, bs x 4 ?Skin: limited exam- no rashes/lesions noted ?Extremities: warm/dry, pulses + 2 R/P, no edema noted ? ? ?Resolved Hospital Problem list   ? ? ?Assessment & Plan:  ?Acute Hypoxic / Hypercapnic Respiratory Failure s/t COPD exacerbation vs PE? Vs infectious source ?PMHx: COPD with chronic tracheostomy ?- STAT CTa to r/o PE ?- Ventilator settings: PRVC  8 mL/kg, 40% FiO2, 5 PEEP, continue ventilator support & lung protective strategies ?- Wean PEEP & FiO2 as tolerated, maintain SpO2 > 90% ?- Head of bed elevated 30 degrees, VAP protocol in place ?- Plateau pressures less than 30 cm H20  ?- Intermittent chest x-ray & ABG PRN ?- Daily WUA with SBT as tolerated  ?- Ensure adequate pulmonary hygiene  ?- F/u cultures, trend PCT ?- Steroids initiated: solu-medrol 40 mg BID  ?- Budesonide nebs BID, Duo Neb Q 6, bronchodilators PRN ?- morphine IV PRN for increased WOB/anxiety ?- legionella, strep pna, respiratory viral panel pending ? ?Suspected sepsis without septic shock due to unknown source ?Initial interventions/workup included: 2.5 L of NS/LR & Cefepime/ Vancomycin ?- Supplemental oxygen as needed, to maintain SpO2 > 90% ?- f/u cultures, trend lactic/ PCT ?- Daily CBC, monitor WBC/ fever curve ?- IV antibiotics: cefepime  & vancomycin  ?- IVF hydration as needed ?- Strict I/O's: alert provider if UOP < 0.5 mL/kg/hr ? ?Type 2 Diabetes Mellitus ?- Monitor CBG Q 4 hours ?- SSI resistant dosing ?- target range while in ICU: 140-180 ?- follow ICU hyper/hypo-glycemia protocol ? ?Hypertension ?- continuous cardiac monitoring ?- PO medications on hold:  labetalol PRN to maintain SBP < 170, restart home medications as tolerated ? ?Elevated Alk Phos in the setting of abdominal pain & suspected  sepsis ?PMHx:Hep C ?- Trend hepatic function ?- STAT CT abdomen/pelvis with contrast ?- avoid hepatotoxic agents ? ?Best Practice (right click and "Reselect all SmartList Selections" daily)  ?Diet/type: NPO ?DVT prophylaxis: LMWH ?GI prophylaxis: PPI ?Lines: N/A ?Foley:  N/A ?Code Status:  DNR ?Last date of multidisciplinary goals of care discussion [09/30/21] ? ?Labs   ?CBC: ?Recent Labs  ?Lab 09/30/21 ?0370  ?WBC 12.2*  ?NEUTROABS 10.4*  ?HGB 12.3  ?HCT 40.2  ?MCV 84.1  ?PLT 279  ? ? ?Basic Metabolic Panel: ?Recent Labs  ?Lab 09/30/21 ?0001  ?NA 141  ?K 3.6  ?CL 108  ?CO2 19*  ?GLUCOSE 218*  ?BUN 10  ?CREATININE 0.61  ?CALCIUM 9.3  ?MG 1.9  ? ?GFR: ?Estimated Creatinine Clearance: 70.2 mL/min (by C-G formula based on SCr of 0.61 mg/dL). ?Recent Labs  ?Lab 09/30/21 ?0001 09/30/21 ?4888  ?PROCALCITON <0.10  --   ?WBC  --  12.2*  ?LATICACIDVEN 1.9  --   ? ? ?Liver Function Tests: ?Recent Labs  ?Lab 09/30/21 ?0001  ?AST 19  ?ALT 15  ?ALKPHOS 130*  ?BILITOT 0.7  ?PROT 8.6*  ?ALBUMIN 4.3  ? ?No results for input(s): LIPASE, AMYLASE in the last 168 hours. ?No results for input(s): AMMONIA in the last 168 hours. ? ?ABG ?   ?Component Value Date/Time  ? PHART 7.31 (L) 09/30/2021 0001  ? PCO2ART 51 (H) 09/30/2021 0001  ? PO2ART 70 (L) 09/30/2021 0001  ?  HCO3 25.7 09/30/2021 0001  ? ACIDBASEDEF 1.2 09/30/2021 0001  ? O2SAT 95.4 09/30/2021 0001  ?  ? ?Coagulation Profile: ?Recent Labs  ?Lab 09/30/21 ?0010  ?INR 1.0  ? ? ?Cardiac Enzymes: ?No results for input(s): CKTOTAL, CKMB, CKMBINDEX, TROPONINI in the last 168 hours. ? ?HbA1C: ?Hemoglobin A1C  ?Date/Time Value Ref Range Status  ?06/29/2013 04:02 AM 6.0 4.2 - 6.3 % Final  ?  Comment:  ?  The American Diabetes Association recommends that a primary goal of ?therapy should be <7% and that physicians should reevaluate the ?treatment regimen in patients with HbA1c values consistently >8%. ?  ? ?Hgb A1c MFr Bld  ?Date/Time Value Ref Range Status  ?08/29/2019 02:12 AM 8.6 (H) 4.8  - 5.6 % Final  ?  Comment:  ?  (NOTE) ?Pre diabetes:          5.7%-6.4% ?Diabetes:              >6.4% ?Glycemic control for   <7.0% ?adults with diabetes ?  ?06/25/2019 03:28 AM 11.8 (H) 4.8 - 5.6 % Final  ?

## 2021-09-30 NOTE — Progress Notes (Signed)
PHARMACY - PHYSICIAN COMMUNICATION ?CRITICAL VALUE ALERT - BLOOD CULTURE IDENTIFICATION (BCID) ? ?Michelle [name] is an 61 y.o. female presenting to [facility] ED from home via EMS on 09/29/2021 with complaints of respiratory distress ? ?Assessment: Day 1 of Vanc and Cefepime for pneumonia. BCID: 1/4 aerobic bottle growing Streptococcus agalactiae.  ? ?Recommended therapy would be cefazolin given amoxicillin allergy.  ?(1) Would you like to order repeat blood cultures?  ?(2) MRSA nasal PCR negative. would you like to d/c vancomycin?  ?(3) Cefepime also covers strep agalactae. Would you like to continue cefepime and consider de-escalation to ancef pending respiratory cultures? ? ?Name of physician (or Provider) Contacted: Dr. Mortimer Fries ? ?Current antibiotics: Vancomycin and Cefepime ? ?Changes to prescribed antibiotics recommended:  ?Recommendations accepted by provider ?D/C Vancomycin. MD to order repeat blood cultures. Continue to monitor respiratory cultures pending de-escalation from cefepime ? ? ?Results for orders placed or performed during the hospital encounter of 09/29/21  ?Blood Culture ID Panel (Reflexed) (Collected: 09/30/2021 12:01 AM)  ?Result Value Ref Range  ? Enterococcus faecalis NOT DETECTED NOT DETECTED  ? Enterococcus Faecium NOT DETECTED NOT DETECTED  ? Listeria monocytogenes NOT DETECTED NOT DETECTED  ? Staphylococcus species NOT DETECTED NOT DETECTED  ? Staphylococcus aureus (BCID) NOT DETECTED NOT DETECTED  ? Staphylococcus epidermidis NOT DETECTED NOT DETECTED  ? Staphylococcus lugdunensis NOT DETECTED NOT DETECTED  ? Streptococcus species DETECTED (A) NOT DETECTED  ? Streptococcus agalactiae DETECTED (A) NOT DETECTED  ? Streptococcus pneumoniae NOT DETECTED NOT DETECTED  ? Streptococcus pyogenes NOT DETECTED NOT DETECTED  ? A.calcoaceticus-baumannii NOT DETECTED NOT DETECTED  ? Bacteroides fragilis NOT DETECTED NOT DETECTED  ? Enterobacterales NOT DETECTED NOT DETECTED  ? Enterobacter cloacae complex NOT  DETECTED NOT DETECTED  ? Escherichia coli NOT DETECTED NOT DETECTED  ? Klebsiella aerogenes NOT DETECTED NOT DETECTED  ? Klebsiella oxytoca NOT DETECTED NOT DETECTED  ? Klebsiella pneumoniae NOT DETECTED NOT DETECTED  ? Proteus species NOT DETECTED NOT DETECTED  ? Salmonella species NOT DETECTED NOT DETECTED  ? Serratia marcescens NOT DETECTED NOT DETECTED  ? Haemophilus influenzae NOT DETECTED NOT DETECTED  ? Neisseria meningitidis NOT DETECTED NOT DETECTED  ? Pseudomonas aeruginosa NOT DETECTED NOT DETECTED  ? Stenotrophomonas maltophilia NOT DETECTED NOT DETECTED  ? Candida albicans NOT DETECTED NOT DETECTED  ? Candida auris NOT DETECTED NOT DETECTED  ? Candida glabrata NOT DETECTED NOT DETECTED  ? Candida krusei NOT DETECTED NOT DETECTED  ? Candida parapsilosis NOT DETECTED NOT DETECTED  ? Candida tropicalis NOT DETECTED NOT DETECTED  ? Cryptococcus neoformans/gattii NOT DETECTED NOT DETECTED  ? ? ?Darrick Penna ?09/30/2021  6:02 PM ? ?

## 2021-09-30 NOTE — Progress Notes (Signed)
CODE SEPSIS - PHARMACY COMMUNICATION ? ?**Broad Spectrum Antibiotics should be administered within 1 hour of Sepsis diagnosis** ? ?Time Code Sepsis Called/Page Received: 4/28 @ 1610  ? ?Antibiotics Ordered: Vanc, levaquin  ? ?Time of 1st antibiotic administration: Levaquin 750 mg IV X 1 on 4/29 @ 0015 ? ?Additional action taken by pharmacy:  ? ?If necessary, Name of Provider/Nurse Contacted:  ? ? ? ?Eldonna Neuenfeldt D ,PharmD ?Clinical Pharmacist  ?09/30/2021  12:20 AM ? ?

## 2021-09-30 NOTE — Progress Notes (Signed)
PHARMACY -  BRIEF ANTIBIOTIC NOTE  ? ?Pharmacy has received consult(s) for Vanc, levaquin from an ED provider.  The patient's profile has been reviewed for ht/wt/allergies/indication/available labs.   ? ?One time order(s) placed for Vancomycin 1750 mg IV X 1, and levaquin 750 mg IV X 1  ? ?Further antibiotics/pharmacy consults should be ordered by admitting physician if indicated.       ?                ?Thank you, ?Sajad Glander D ?09/30/2021  12:19 AM ? ?

## 2021-10-01 DIAGNOSIS — J9600 Acute respiratory failure, unspecified whether with hypoxia or hypercapnia: Secondary | ICD-10-CM | POA: Diagnosis not present

## 2021-10-01 LAB — BASIC METABOLIC PANEL
Anion gap: 7 (ref 5–15)
BUN: 16 mg/dL (ref 6–20)
CO2: 26 mmol/L (ref 22–32)
Calcium: 8.7 mg/dL — ABNORMAL LOW (ref 8.9–10.3)
Chloride: 108 mmol/L (ref 98–111)
Creatinine, Ser: 0.76 mg/dL (ref 0.44–1.00)
GFR, Estimated: >60 mL/min (ref >60)
Glucose, Bld: 223 mg/dL — ABNORMAL HIGH (ref 70–99)
Potassium: 4.4 mmol/L (ref 3.5–5.1)
Sodium: 141 mmol/L (ref 135–145)

## 2021-10-01 LAB — RESPIRATORY PANEL BY PCR

## 2021-10-01 LAB — CBC WITH DIFFERENTIAL/PLATELET
Abs Immature Granulocytes: 0.12 10*3/uL — ABNORMAL HIGH (ref 0.00–0.07)
Basophils Absolute: 0 10*3/uL (ref 0.0–0.1)
Basophils Relative: 0 %
Eosinophils Absolute: 0 10*3/uL (ref 0.0–0.5)
Eosinophils Relative: 0 %
HCT: 32 % — ABNORMAL LOW (ref 36.0–46.0)
Hemoglobin: 9.9 g/dL — ABNORMAL LOW (ref 12.0–15.0)
Immature Granulocytes: 1 %
Lymphocytes Relative: 8 %
Lymphs Abs: 0.7 10*3/uL (ref 0.7–4.0)
MCH: 25.8 pg — ABNORMAL LOW (ref 26.0–34.0)
MCHC: 30.9 g/dL (ref 30.0–36.0)
MCV: 83.3 fL (ref 80.0–100.0)
Monocytes Absolute: 0.6 10*3/uL (ref 0.1–1.0)
Monocytes Relative: 6 %
Neutro Abs: 8.1 10*3/uL — ABNORMAL HIGH (ref 1.7–7.7)
Neutrophils Relative %: 85 %
Platelets: 244 10*3/uL (ref 150–400)
RBC: 3.84 MIL/uL — ABNORMAL LOW (ref 3.87–5.11)
RDW: 14.4 % (ref 11.5–15.5)
WBC: 9.5 10*3/uL (ref 4.0–10.5)
nRBC: 0 % (ref 0.0–0.2)

## 2021-10-01 LAB — GLUCOSE, CAPILLARY
Glucose-Capillary: 151 mg/dL — ABNORMAL HIGH (ref 70–99)
Glucose-Capillary: 190 mg/dL — ABNORMAL HIGH (ref 70–99)
Glucose-Capillary: 203 mg/dL — ABNORMAL HIGH (ref 70–99)
Glucose-Capillary: 207 mg/dL — ABNORMAL HIGH (ref 70–99)
Glucose-Capillary: 213 mg/dL — ABNORMAL HIGH (ref 70–99)
Glucose-Capillary: 219 mg/dL — ABNORMAL HIGH (ref 70–99)
Glucose-Capillary: 231 mg/dL — ABNORMAL HIGH (ref 70–99)

## 2021-10-01 LAB — URINE CULTURE: Culture: NO GROWTH

## 2021-10-01 LAB — MAGNESIUM: Magnesium: 2.2 mg/dL (ref 1.7–2.4)

## 2021-10-01 LAB — PHOSPHORUS: Phosphorus: 2.2 mg/dL — ABNORMAL LOW (ref 2.5–4.6)

## 2021-10-01 LAB — PROCALCITONIN: Procalcitonin: <0.1 ng/mL

## 2021-10-01 MED ORDER — DIAZEPAM 5 MG/ML IJ SOLN
INTRAMUSCULAR | Status: AC
  Administered 2021-10-01: 5 mg via INTRAVENOUS
  Filled 2021-10-01: qty 2

## 2021-10-01 MED ORDER — DIAZEPAM 5 MG/ML IJ SOLN: 10.0000 mg | Freq: Once | INTRAMUSCULAR | Status: AC

## 2021-10-01 MED ORDER — CLONAZEPAM 0.5 MG PO TABS
0.5000 mg | ORAL_TABLET | Freq: Three times a day (TID) | ORAL | Status: DC
Start: 2021-10-01 — End: 2021-10-06
  Administered 2021-10-01 – 2021-10-05 (×14): 0.5 mg via ORAL
  Filled 2021-10-01 (×14): qty 1

## 2021-10-01 MED ORDER — K PHOS MONO-SOD PHOS DI & MONO 155-852-130 MG PO TABS
500.0000 mg | ORAL_TABLET | ORAL | Status: AC
  Administered 2021-10-01 (×2): 500 mg via ORAL
  Filled 2021-10-01 (×2): qty 2

## 2021-10-01 NOTE — Progress Notes (Signed)
? ?NAME:  Michelle [name], MRN:  [id number], DOB:  Aug 11, 1960, LOS: 1 ?ADMISSION DATE:  09/29/2021, CONSULTATION DATE:  09/30/21 ?REFERRING MD:  Dr. Beather Arbour, CHIEF COMPLAINT:  Respiratory Distress ? ?History of Present Illness/SYNOPSIS  ?61 year old female presenting to [facility] ED from home via EMS on 09/29/2021 with complaints of respiratory distress.  Per ED documentation patient arrived with nonrebreather mask in place, tripoding and guppy breathing.  Per the patient's report which is confirmed by daughter who is bedside, she developed a sore throat with a dry cough and fever on 09/28/2021.   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   ?She arrived at the ED with her home ventilator that she uses 24/7.  ?Patient does admit to chest tightness on arrival, but reports this is gone now.  She continues to feel short of breath and states that what has helped the most is Klonopin and morphine.   ? ?She endorses generalized abdominal pain and tenderness to touch, but denies nausea vomiting or diarrhea.  Patient also confirms she saw her doctor at the beginning of the month with complaints of shortness of breath and a productive cough.  Daughter confirms the patient was prescribed doxycycline course which she completed. ? ?PCCM consulted for admission due to acute on chronic respiratory failure requiring mechanical ventilatory support via tracheostomy. ? ?Pertinent  Medical History  ?COPD ?CHF ?CAD ?Hep C ?Tobacco abuse ?Cocaine abuse ?Asthma ?Anxiety ? ?Significant Hospital Events: ?Including procedures, antibiotic start and stop dates in addition to other pertinent events   ?09/30/21: Admit to ICU on home ventilator with acute on chronic respiratory failure ?4/30 remains on vent, at home settings ? ? ?Interim History / Subjective:  ?Patient alert and responsive,  ?+SOB ?+anxiety ?Remains on vent ?On IV abx and steroids ?Blood cultures +STREP AGAL SPECIES ? ?Objective   ?Blood pressure 103/61,  pulse 78, temperature 97.7 ?F (36.5 ?C), temperature source Oral, resp. rate 14, height '5\' 2"'$  (1.575 m), weight 73.5 kg, last menstrual period 03/06/2005, SpO2 100 %. ?   ?Vent Mode: PRVC ?FiO2 (%):  [40 %] 40 % ?Set Rate:  [16 bmp-20 bmp] 16 bmp ?Vt Set:  [400 mL-450 mL] 400 mL ?PEEP:  [5 cmH20] 5 cmH20 ?Plateau Pressure:  [11 cmH20-17 cmH20] 17 cmH20  ? ?Intake/Output Summary (Last 24 hours) at 10/01/2021 9381 ?Last data filed at 09/30/2021 1900 ?Gross per 24 hour  ?Intake 751.03 ml  ?Output 900 ml  ?Net -148.97 ml  ? ? ?Filed Weights  ? 09/29/21 2357  ?Weight: 73.5 kg  ? ? ? ?Review of Systems: ? ?Gen:  Denies  fever, sweats, chills weight loss  ?HEENT: Denies blurred vision, double vision, ear pain, eye pain, hearing loss, nose bleeds, sore throat ?Cardiac:  No dizziness, chest pain or heaviness, chest tightness,edema, No JVD ?Resp:   + cough, +sputum production, +shortness of breath,-wheezing, -hemoptysis,  ?Gi: Denies swallowing difficulty, stomach pain, nausea or vomiting, diarrhea, constipation, bowel incontinence ?Other:  All other systems negative ? ?BP 103/61   Pulse 78   Temp 97.7 ?F (36.5 ?C) (Oral)   Resp 14   Ht '5\' 2"'$  (1.575 m)   Wt 80.1 kg   LMP 03/06/2005 (Approximate)   SpO2 100%   BMI 32.30 kg/m?  ? ? ?Physical Examination:  ? ?General Appearance: +distress ?EYES PERRLA, EOM intact.   ?NECK Supple, No JVD ?Pulmonary: +rhonchi +wheezes ?CardiovascularNormal S1,S2.  No m/r/g.   ?Abdomen: Benign, Soft, non-tender. ?Skin:   warm, no  rashes, no ecchymosis  ?Extremities: normal, no cyanosis, clubbing. ?Neuro:without focal findings,  speech normal  ?PSYCHIATRIC: Mood, affect within normal limits. ? ? ? ?Resolved Hospital Problem list   ? ? ?Assessment & Plan:  ?61 yo AAF with acute on chronic h/p trach RESP FAILURE WITH COPD Exacerbation WITH HYPOXIC RESP FAILURE  ?PMHx: COPD with chronic tracheostomy ? ? ?Severe ACUTE Hypoxic and Hypercapnic Respiratory Failure ?-continue Mechanical Ventilator  support 24/7  ?-Wean Fio2 and PEEP as tolerated ?-VAP/VENT bundle implementation ?- Wean PEEP & FiO2 as tolerated, maintain SpO2 > 88% ?- Head of bed elevated 30 degrees, VAP protocol in place ?- Plateau pressures less than 30 cm H20  ?- Intermittent chest x-ray & ABG PRN ?- Ensure adequate pulmonary hygiene  ?No vent weaning as patient has chronic vent ? ?SEVERE COPD EXACERBATION ?-continue IV steroids as prescribed ?-continue NEB THERAPY as prescribed ?-morphine as needed ?-wean fio2 as needed and tolerated ? ?SEPTIC  ?[place] cultures +STREP AGAL ?-use vasopressors to keep MAP>65 as needed ?-follow ABG and LA as needed ?-follow up cultures ?-emperic ABX ? ?KIDNEYS ?-continue Foley Catheter-assess need ?-Avoid nephrotoxic agents ?-Follow urine output, BMP ?-Ensure adequate renal perfusion, optimize oxygenation ?-Renal dose medications ? ? ?Intake/Output Summary (Last 24 hours) at 10/01/2021 0630 ?Last data filed at 09/30/2021 1900 ?Gross per 24 hour  ?Intake 751.03 ml  ?Output 900 ml  ?Net -148.97 ml  ? ? ? ?ENDO ?- ICU hypoglycemic\Hyperglycemia protocol ?-check FSBS per protocol ? ? ?GI ?GI PROPHYLAXIS as indicated ? ?NUTRITIONAL STATUS ?DIET--patients eats while on event ?Constipation protocol as indicated ? ? ?ELECTROLYTES ?-follow labs as needed ?-replace as needed ?-pharmacy consultation and following ? ? ? ?Best Practice (right click and "Reselect all SmartList Selections" daily)  ?Diet/type: NPO ?DVT prophylaxis: LMWH ?GI prophylaxis: PPI ?Lines: N/A ?Foley:  N/A ?Code Status:  DNR ?Last date of multidisciplinary goals of care discussion [09/30/21] ? ?Labs   ?CBC: ?Recent Labs  ?Lab 09/30/21 ?2633 09/30/21 ?3545 10/01/21 ?0454  ?WBC 12.2* 12.8* 9.5  ?NEUTROABS 10.4*  --  8.1*  ?HGB 12.3 10.5* 9.9*  ?HCT 40.2 34.5* 32.0*  ?MCV 84.1 85.2 83.3  ?PLT 279 243 244  ? ? ? ?Basic Metabolic Panel: ?Recent Labs  ?Lab 09/30/21 ?0001 09/30/21 ?6256 10/01/21 ?0454  ?NA 141 137 141  ?K 3.6 4.2 4.4  ?CL 108 105 108   ?CO2 19* 22 26  ?GLUCOSE 218* 322* 223*  ?BUN '10 9 16  '$ ?CREATININE 0.61 0.70 0.76  ?CALCIUM 9.3 8.4* 8.7*  ?MG 1.9 2.0 2.2  ?PHOS  --  3.9 2.2*  ? ? ?GFR: ?Estimated Creatinine Clearance: 70.2 mL/min (by C-G formula based on SCr of 0.76 mg/dL). ?Recent Labs  ?Lab 09/30/21 ?0001 09/30/21 ?3893 09/30/21 ?0324 09/30/21 ?7342 09/30/21 ?8768 10/01/21 ?0454  ?PROCALCITON <0.10  --   --  <0.10  --   --   ?WBC  --  12.2*  --  12.8*  --  9.5  ?LATICACIDVEN 1.9  --  2.1*  --  2.7*  --   ? ? ? ?Liver Function Tests: ?Recent Labs  ?Lab 09/30/21 ?0001  ?AST 19  ?ALT 15  ?ALKPHOS 130*  ?BILITOT 0.7  ?PROT 8.6*  ?ALBUMIN 4.3  ? ? ?No results for input(s): LIPASE, AMYLASE in the last 168 hours. ?No results for input(s): AMMONIA in the last 168 hours. ? ?ABG ?   ?Component Value Date/Time  ? PHART 7.31 (L) 09/30/2021 0500  ? PCO2ART 46 09/30/2021 0500  ? PO2ART  155 (H) 09/30/2021 0500  ? HCO3 23.2 09/30/2021 0500  ? ACIDBASEDEF 3.3 (H) 09/30/2021 0500  ? O2SAT 100 09/30/2021 0500  ? ?  ? ?Coagulation Profile: ?Recent Labs  ?Lab 09/30/21 ?0010  ?INR 1.0  ? ? ? ?Cardiac Enzymes: ?No results for input(s): CKTOTAL, CKMB, CKMBINDEX, TROPONINI in the last 168 hours. ? ?HbA1C: ?Hemoglobin A1C  ?Date/Time Value Ref Range Status  ?06/29/2013 04:02 AM 6.0 4.2 - 6.3 % Final  ?  Comment:  ?  The American Diabetes Association recommends that a primary goal of ?therapy should be <7% and that physicians should reevaluate the ?treatment regimen in patients with HbA1c values consistently >8%. ?  ? ?Hgb A1c MFr Bld  ?Date/Time Value Ref Range Status  ?09/30/2021 05:52 AM 10.2 (H) 4.8 - 5.6 % Final  ?  Comment:  ?  (NOTE) ?Pre diabetes:          5.7%-6.4% ? ?Diabetes:              >6.4% ? ?Glycemic control for   <7.0% ?adults with diabetes ?  ?08/29/2019 02:12 AM 8.6 (H) 4.8 - 5.6 % Final  ?  Comment:  ?  (NOTE) ?Pre diabetes:          5.7%-6.4% ?Diabetes:              >6.4% ?Glycemic control for   <7.0% ?adults with diabetes ?  ? ? ?CBG: ?Recent  Labs  ?Lab 09/30/21 ?1159 09/30/21 ?1538 09/30/21 ?1930 10/01/21 ?0017 10/01/21 ?0337  ?GLUCAP 265* 310* 282* 219* 207*  ? ? ?Allergies ?Allergies  ?Allergen Reactions  ? Amoxicillin   ?  Patient has tolerated cephalospor

## 2021-10-01 NOTE — Progress Notes (Signed)
Pharmacy Antibiotic Note ? ?Michelle [name] is a 61 y.o. female admitted on 09/29/2021 with sepsis, PNA.  Pharmacy has been consulted for Cefepime dosing. ? ?-Bcx: 1of4: +strep agalactiae ?-trach cx pending ? ?Plan: ?Cefepime 2 gm IV Q8H ordered to start on 4/29 @ 0300.  ? ? ? ? ?Height: '5\' 2"'$  (157.5 cm) ?Weight: 80.1 kg (176 lb 9.4 oz) ?IBW/kg (Calculated) : 50.1 ? ?Temp (24hrs), Avg:98.5 ?F (36.9 ?C), Min:97.7 ?F (36.5 ?C), Max:99.3 ?F (37.4 ?C) ? ?Recent Labs  ?Lab 09/30/21 ?0001 09/30/21 ?2119 09/30/21 ?0324 09/30/21 ?4174 09/30/21 ?0814 10/01/21 ?0454  ?WBC  --  12.2*  --  12.8*  --  9.5  ?CREATININE 0.61  --   --  0.70  --  0.76  ?LATICACIDVEN 1.9  --  2.1*  --  2.7*  --   ? ?  ?Estimated Creatinine Clearance: 73.3 mL/min (by C-G formula based on SCr of 0.76 mg/dL).   ? ?Allergies  ?Allergen Reactions  ? Amoxicillin   ?  Patient has tolerated cephalosporins in the past  ? Ativan [Lorazepam]   ?  Makes anxiety worse   ? ? ?Antimicrobials this admission: ? Levaquin x 1 dosee 4/29 ?Vanc x 1 dose 4/29 ?Cefepime 4/29 >>  ? ? ?Dose adjustments this admission: ? ? ?Microbiology results: ? 4/29 BCx: 1 of 4: strep.agalactiae ? 4/29 UCx:  NG ? Sputum:   ? MRSA PCR: neg ?Trach cx pending ? ?Thank you for allowing pharmacy to be a part of this patient?s care. ? ?Tajuan Dufault A ?10/01/2021 10:45 AM ? ?

## 2021-10-01 NOTE — Progress Notes (Signed)
Pharmacy Electrolyte Monitoring Consult: ? ?Pharmacy consulted to assist in monitoring and replacing electrolytes in this 61 y.o. female admitted on 09/29/2021 with Respiratory Distress ? ? ?Labs: ? ?Sodium (mmol/L)  ?Date Value  ?10/01/2021 141  ?07/11/2014 139  ? ?Potassium (mmol/L)  ?Date Value  ?10/01/2021 4.4  ?07/11/2014 4.6  ? ?Magnesium (mg/dL)  ?Date Value  ?10/01/2021 2.2  ?10/31/2013 2.6 (H)  ? ?Phosphorus (mg/dL)  ?Date Value  ?10/01/2021 2.2 (L)  ?10/31/2013 4.6  ? ?Calcium (mg/dL)  ?Date Value  ?10/01/2021 8.7 (L)  ? ?Calcium, Total (mg/dL)  ?Date Value  ?07/11/2014 8.9  ? ?Albumin (g/dL)  ?Date Value  ?09/30/2021 4.3  ?07/09/2014 3.7  ? ? ?Assessment/Plan:SHOB, sepsis ?chronic tracheostomy from [name] previous COPD exacerbation.  She arrived at the ED with her home ventilator that she uses 24/7. Hx dM ? ?Phos 2.2 ?Will order KPhos neutral tab- 2 tabs q4h po x 2 doses ?-f/u electrolytes as ordered ? ?Michelle [name] ?10/01/2021 ?10:40 AM ?                     ? ? ? ? ?  ?

## 2021-10-02 ENCOUNTER — Inpatient Hospital Stay: Payer: Medicaid Other

## 2021-10-02 DIAGNOSIS — J9622 Acute and chronic respiratory failure with hypercapnia: Secondary | ICD-10-CM | POA: Diagnosis not present

## 2021-10-02 DIAGNOSIS — J441 Chronic obstructive pulmonary disease with (acute) exacerbation: Secondary | ICD-10-CM | POA: Diagnosis not present

## 2021-10-02 DIAGNOSIS — J9621 Acute and chronic respiratory failure with hypoxia: Secondary | ICD-10-CM | POA: Diagnosis not present

## 2021-10-02 LAB — BASIC METABOLIC PANEL
Anion gap: 5 (ref 5–15)
BUN: 21 mg/dL — ABNORMAL HIGH (ref 6–20)
CO2: 29 mmol/L (ref 22–32)
Calcium: 8.6 mg/dL — ABNORMAL LOW (ref 8.9–10.3)
Chloride: 110 mmol/L (ref 98–111)
Creatinine, Ser: 0.55 mg/dL (ref 0.44–1.00)
GFR, Estimated: >60 mL/min (ref >60)
Glucose, Bld: 144 mg/dL — ABNORMAL HIGH (ref 70–99)
Potassium: 3.8 mmol/L (ref 3.5–5.1)
Sodium: 144 mmol/L (ref 135–145)

## 2021-10-02 LAB — CULTURE, BLOOD (ROUTINE X 2): Special Requests: ADEQUATE

## 2021-10-02 LAB — CBC WITH DIFFERENTIAL/PLATELET
Abs Immature Granulocytes: 0.07 10*3/uL (ref 0.00–0.07)
Basophils Absolute: 0 10*3/uL (ref 0.0–0.1)
Basophils Relative: 0 %
Eosinophils Absolute: 0 10*3/uL (ref 0.0–0.5)
Eosinophils Relative: 0 %
HCT: 33.4 % — ABNORMAL LOW (ref 36.0–46.0)
Hemoglobin: 10.3 g/dL — ABNORMAL LOW (ref 12.0–15.0)
Immature Granulocytes: 1 %
Lymphocytes Relative: 10 %
Lymphs Abs: 0.7 10*3/uL (ref 0.7–4.0)
MCH: 26.1 pg (ref 26.0–34.0)
MCHC: 30.8 g/dL (ref 30.0–36.0)
MCV: 84.6 fL (ref 80.0–100.0)
Monocytes Absolute: 0.3 10*3/uL (ref 0.1–1.0)
Monocytes Relative: 4 %
Neutro Abs: 5.9 10*3/uL (ref 1.7–7.7)
Neutrophils Relative %: 85 %
Platelets: 229 10*3/uL (ref 150–400)
RBC: 3.95 MIL/uL (ref 3.87–5.11)
RDW: 14.2 % (ref 11.5–15.5)
WBC: 7 10*3/uL (ref 4.0–10.5)
nRBC: 0 % (ref 0.0–0.2)

## 2021-10-02 LAB — MAGNESIUM: Magnesium: 2.2 mg/dL (ref 1.7–2.4)

## 2021-10-02 LAB — GLUCOSE, CAPILLARY
Glucose-Capillary: 137 mg/dL — ABNORMAL HIGH (ref 70–99)
Glucose-Capillary: 210 mg/dL — ABNORMAL HIGH (ref 70–99)
Glucose-Capillary: 215 mg/dL — ABNORMAL HIGH (ref 70–99)
Glucose-Capillary: 239 mg/dL — ABNORMAL HIGH (ref 70–99)
Glucose-Capillary: 249 mg/dL — ABNORMAL HIGH (ref 70–99)
Glucose-Capillary: 303 mg/dL — ABNORMAL HIGH (ref 70–99)

## 2021-10-02 LAB — LEGIONELLA PNEUMOPHILA SEROGP 1 UR AG: L. pneumophila Serogp 1 Ur Ag: NEGATIVE

## 2021-10-02 LAB — PHOSPHORUS: Phosphorus: 3 mg/dL (ref 2.5–4.6)

## 2021-10-02 MED ORDER — ONDANSETRON HCL 4 MG/2ML IJ SOLN
4.0000 mg | Freq: Four times a day (QID) | INTRAMUSCULAR | Status: DC | PRN
  Administered 2021-10-02: 4 mg via INTRAVENOUS
  Filled 2021-10-02: qty 2

## 2021-10-02 MED ORDER — VITAMIN D (ERGOCALCIFEROL) 1.25 MG (50000 UNIT) PO CAPS
50000.0000 [IU] | ORAL_CAPSULE | ORAL | Status: DC
  Administered 2021-10-02 – 2021-10-05 (×2): 50000 [IU] via ORAL
  Filled 2021-10-02 (×2): qty 1

## 2021-10-02 MED ORDER — AMLODIPINE BESYLATE 5 MG PO TABS
5.0000 mg | ORAL_TABLET | Freq: Every day | ORAL | Status: DC
  Administered 2021-10-02 – 2021-10-05 (×4): 5 mg via ORAL
  Filled 2021-10-02 (×4): qty 1

## 2021-10-02 MED ORDER — MORPHINE SULFATE (PF) 2 MG/ML IV SOLN
2.0000 mg | INTRAVENOUS | Status: DC | PRN
  Administered 2021-10-02 (×2): 2 mg via INTRAVENOUS
  Administered 2021-10-03: 4 mg via INTRAVENOUS
  Administered 2021-10-03 – 2021-10-05 (×4): 2 mg via INTRAVENOUS
  Filled 2021-10-02 (×3): qty 1
  Filled 2021-10-02: qty 2
  Filled 2021-10-02 (×3): qty 1

## 2021-10-02 MED ORDER — SODIUM CHLORIDE 0.9 % IV SOLN
2.0000 g | INTRAVENOUS | Status: DC
  Administered 2021-10-02 – 2021-10-03 (×2): 2 g via INTRAVENOUS
  Filled 2021-10-02 (×3): qty 20

## 2021-10-02 MED ORDER — ADULT MULTIVITAMIN W/MINERALS CH
1.0000 | ORAL_TABLET | Freq: Every day | ORAL | Status: DC
  Administered 2021-10-03 – 2021-10-05 (×3): 1 via ORAL
  Filled 2021-10-02 (×2): qty 1

## 2021-10-02 MED ORDER — POTASSIUM CHLORIDE 20 MEQ PO PACK
40.0000 meq | PACK | Freq: Once | ORAL | Status: AC
  Administered 2021-10-02: 40 meq via ORAL
  Filled 2021-10-02: qty 2

## 2021-10-02 MED ORDER — CLONIDINE HCL 0.1 MG/24HR TD PTWK
0.1000 mg | MEDICATED_PATCH | TRANSDERMAL | Status: DC
  Administered 2021-10-02: 0.1 mg via TRANSDERMAL
  Filled 2021-10-02: qty 1

## 2021-10-02 MED ORDER — OXYCODONE-ACETAMINOPHEN 5-325 MG PO TABS
1.0000 | ORAL_TABLET | Freq: Three times a day (TID) | ORAL | Status: DC | PRN
  Administered 2021-10-02 – 2021-10-05 (×4): 1 via ORAL
  Filled 2021-10-02 (×4): qty 1

## 2021-10-02 MED ORDER — ENSURE MAX PROTEIN PO LIQD
11.0000 [oz_av] | Freq: Two times a day (BID) | ORAL | Status: DC
  Administered 2021-10-03 – 2021-10-05 (×4): 11 [oz_av] via ORAL
  Filled 2021-10-02: qty 330

## 2021-10-02 NOTE — Progress Notes (Signed)
? ?NAME:  Michelle [name], MRN:  [id number], DOB:  02-05-1961, LOS: 2 ?ADMISSION DATE:  09/29/2021, CONSULTATION DATE:  09/30/21 ?REFERRING MD:  Dr. Beather Arbour, CHIEF COMPLAINT:  Respiratory Distress ? ?History of Present Illness:  ?61 year old female presenting to [facility] ED from home via EMS on 09/29/2021 with complaints of respiratory distress.  Per ED documentation patient arrived with nonrebreather mask in place, tripoding and guppy breathing.  Per the patient's report which is confirmed by daughter who is bedside, she developed a sore throat with a dry cough and fever on 09/28/2021.  By 09/29/2021 the patient started becoming short of breath.  Daughter states she tried saline treatments which helped for a little bit but did not completely relieve the shortness of breath.  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  She arrived at the ED with her home ventilator that she uses 24/7.  ?Patient does admit to chest tightness on arrival, but reports this is gone now.  She continues to feel short of breath and states that what has helped the most is Klonopin and morphine.  She endorses generalized abdominal pain and tenderness to touch, but denies nausea vomiting or diarrhea.  Patient also confirms she saw her doctor at the beginning of the month with complaints of shortness of breath and a productive cough.  Daughter confirms the patient was prescribed doxycycline course which she completed. ? ?ED course: ?Septic protocol initiated.  RT suctioned and placed patient on home ventilator.  Differential includes respiratory virus, COPD exacerbation, PE, pneumonia, CHF exacerbation, PTX, ACS ?medications given: Clonazepam, DuoNebs x3, 2.5 L LR bolus, Levaquin/vancomycin, Solu-Medrol, Mg IV, morphine IV, Zofran IV ?Initial Vitals: 98.8, tachypneic at 22, tachycardic 139, hypertensive 196/97 and 96% on home vent ?Significant labs: (Labs/ Imaging personally reviewed) ?I, Domingo Pulse Rust-Chester,  AGACNP-BC, personally viewed and interpreted this ECG. ?EKG Interpretation: Date: 09/29/21, EKG Time: 23:55, Rate: 139, Rhythm: ST with RBBB(baseline), QRS Axis:  RAD, Intervals: RBBB, LPFB (unchanged from previous), ST/T Wave abnormalities: none,  ?Narrative Interpretation: ST with RBBB ?Chemistry: Na+: 141, K+: 3.6, BUN/Cr.: 10/0.61, Serum CO2/ AG: 19/14, Mg: 1.9, alk phos: 130 ?Hematology: WBC: 12.2, Hgb: 12.3,  ?Troponin: 16>29, BNP: 45.9, Lactic/ PCT: 1.9/ <0.10,  ?COVID-19 & Influenza A/B: negative ?ABG: 7.31/ 51/ 70/ 25.7 ?CXR 09/30/21: Chronic appearing increased interstitial lung marking without evidence of acute or active cardiopulmonary disease ?CT angio 09/30/21: pending ?CT abdomen/pelvis w contrast 09/30/21: pending ? ?PCCM consulted for admission due to acute on chronic respiratory failure requiring mechanical ventilatory support via tracheostomy. ? ?Pertinent  Medical History  ?COPD ?CHF ?CAD ?Hep C ?Tobacco abuse ?Cocaine abuse ?Asthma ?Anxiety ? ?Significant Hospital Events: ?Including procedures, antibiotic start and stop dates in addition to other pertinent events   ?09/30/21: Admit to ICU on home ventilator with acute on chronic respiratory failure. ? ?Interim History / Subjective:  ?Patient alert and responsive, dyspneic with complaints of generalized abdominal pain. ? ?Objective   ?Blood pressure (!) 153/120, pulse 80, temperature 99 ?F (37.2 ?C), temperature source Oral, resp. rate 19, height 5' 2"  (1.575 m), weight 80.1 kg, last menstrual period 03/06/2005, SpO2 100 %. ?   ?Vent Mode: PRVC ?FiO2 (%):  [36 %-40 %] 36 % ?Set Rate:  [16 bmp] 16 bmp ?Vt Set:  [400 mL] 400 mL ?PEEP:  [5 cmH20] 5 cmH20 ?Plateau Pressure:  [16 cmH20] 16 cmH20  ? ?Intake/Output Summary (Last 24 hours) at 10/02/2021 1353 ?Last data filed at 10/02/2021 1300 ?Gross per 24  hour  ?Intake 400 ml  ?Output 1525 ml  ?Net -1125 ml  ? ?Filed Weights  ? 09/29/21 2357 10/01/21 0500  ?Weight: 73.5 kg 80.1 kg  ? ? ?Examination: ?General:  Adult female, critically ill, lying in bed requiring mechanical ventilation via tracheostomy ?HEENT: MM pink/moist, anicteric, atraumatic, neck supple ?Neuro: A&O x 4, able to follow commands, PERRL +3, MAE ?CV: s1s2 RRR, ST on monitor, no r/m/g ?Pulm: Regular, mild dyspnea and labored on home mechanical ventilation , breath sounds diminished throughout ?GI: soft, rounded, generalized tenderness to light palpation, bs x 4 ?Skin: limited exam- no rashes/lesions noted ?Extremities: warm/dry, pulses + 2 R/P, no edema noted ? ?Resolved Hospital Problem list   ? ? ?Assessment & Plan:  ?Acute on chronic hypoxic hypercapnic respiratory failure secondary to AECOPD ?PMHx: COPD with chronic tracheostomy and Asthma  ?- Full vent support for now: vent settings reviewed and established ?- Will attempt to transition back to home ventilator when stable from respiratory standpoint  ?- Wean PEEP & FiO2 as tolerated, maintain SpO2 88% to 92% ?- Head of bed elevated 30 degrees, VAP protocol in place ?- Plateau pressures less than 30 cm H20  ?- Intermittent chest x-ray & ABG PRN ?- Daily WUA with SBT as tolerated  ?- Ensure adequate pulmonary hygiene  ?- F/u cultures, trend PCT ?- Continue solu-medrol 40 mg BID  ?- Budesonide nebs BID, Duo Neb Q 6, bronchodilators PRN ? ?HTN ?Hx: CHF, hypercholesteremia, CAD ?- Continuous telemetry monitoring  ?- Continue outpatient amlodipine, aspirin, atorvastatin, clonidine patch, and prn labetalol  ? ?Sepsis without septic shock ?    Blood cultures 04/29 aerobic bottle only>>group B strep (s.   ?    agalactiae) ?- Trend WBC and monitor fever curve  ?- Follow cultures  ?- Cefepime discontinued 05/1  ?- Ceftriaxone started 05/1 ? ?Abdominal pain secondary to couging  ?- Prn percocet and morphine for pain management  ? ?Type 2 Diabetes Mellitus ?- CBG q4hr  ?- SSI and scheduled levemir  ?- target range while in ICU: 140-180 ?- follow ICU hyper/hypo-glycemia protocol ? ?Severe anxiety  ?Hx: Depression   ?- Continue outpatient antidepressants and antianxiety medications  ?- Prn valium for anxiety  ? ?Best Practice (right click and "Reselect all SmartList Selections" daily)  ?Diet/type: Regular ?DVT prophylaxis: LMWH ?GI prophylaxis: PPI ?Lines: N/A ?Foley:  N/A ?Code Status:  DNR ?Last date of multidisciplinary goals of care discussion [10/02/21] ? ?Labs   ?CBC: ?Recent Labs  ?Lab 09/30/21 ?4196 09/30/21 ?2229 10/01/21 ?7989 10/02/21 ?0353  ?WBC 12.2* 12.8* 9.5 7.0  ?NEUTROABS 10.4*  --  8.1* 5.9  ?HGB 12.3 10.5* 9.9* 10.3*  ?HCT 40.2 34.5* 32.0* 33.4*  ?MCV 84.1 85.2 83.3 84.6  ?PLT 279 243 244 229  ? ? ?Basic Metabolic Panel: ?Recent Labs  ?Lab 09/30/21 ?0001 09/30/21 ?2119 10/01/21 ?4174 10/02/21 ?0353  ?NA 141 137 141 144  ?K 3.6 4.2 4.4 3.8  ?CL 108 105 108 110  ?CO2 19* 22 26 29   ?GLUCOSE 218* 322* 223* 144*  ?BUN 10 9 16  21*  ?CREATININE 0.61 0.70 0.76 0.55  ?CALCIUM 9.3 8.4* 8.7* 8.6*  ?MG 1.9 2.0 2.2 2.2  ?PHOS  --  3.9 2.2* 3.0  ? ?GFR: ?Estimated Creatinine Clearance: 73.3 mL/min (by C-G formula based on SCr of 0.55 mg/dL). ?Recent Labs  ?Lab 09/30/21 ?0001 09/30/21 ?0814 09/30/21 ?0324 09/30/21 ?4818 09/30/21 ?5631 10/01/21 ?4970 10/02/21 ?2637  ?PROCALCITON <0.10  --   --  <0.10  --  <  0.10  --   ?WBC  --  12.2*  --  12.8*  --  9.5 7.0  ?LATICACIDVEN 1.9  --  2.1*  --  2.7*  --   --   ? ? ?Liver Function Tests: ?Recent Labs  ?Lab 09/30/21 ?0001  ?AST 19  ?ALT 15  ?ALKPHOS 130*  ?BILITOT 0.7  ?PROT 8.6*  ?ALBUMIN 4.3  ? ?No results for input(s): LIPASE, AMYLASE in the last 168 hours. ?No results for input(s): AMMONIA in the last 168 hours. ? ?ABG ?   ?Component Value Date/Time  ? PHART 7.31 (L) 09/30/2021 0500  ? PCO2ART 46 09/30/2021 0500  ? PO2ART 155 (H) 09/30/2021 0500  ? HCO3 23.2 09/30/2021 0500  ? ACIDBASEDEF 3.3 (H) 09/30/2021 0500  ? O2SAT 100 09/30/2021 0500  ?  ? ?Coagulation Profile: ?Recent Labs  ?Lab 09/30/21 ?0010  ?INR 1.0  ? ? ?Cardiac Enzymes: ?No results for input(s): CKTOTAL, CKMB,  CKMBINDEX, TROPONINI in the last 168 hours. ? ?HbA1C: ?Hemoglobin A1C  ?Date/Time Value Ref Range Status  ?06/29/2013 04:02 AM 6.0 4.2 - 6.3 % Final  ?  Comment:  ?  The American Diabetes Association recommends tha

## 2021-10-02 NOTE — Progress Notes (Signed)
RN made Dr. Patsey Berthold aware that patient c/o nausea. MD gave order for PRN zofran IV.  ?

## 2021-10-02 NOTE — Progress Notes (Signed)
Initial Nutrition Assessment ? ?DOCUMENTATION CODES:  ? ?Obesity unspecified ? ?INTERVENTION:  ? ?Ensure Max protein supplement BID, each supplement provides 150kcal and 30g of protein. ? ?MVI po daily  ? ?NUTRITION DIAGNOSIS:  ? ?Increased nutrient needs related to catabolic illness (COPD) as evidenced by estimated needs. ? ?GOAL:  ? ?Patient will meet greater than or equal to 90% of their needs ? ?MONITOR:  ? ?PO intake, Supplement acceptance, Labs, Weight trends, Skin, I & O's ? ?REASON FOR ASSESSMENT:  ? ?Ventilator ?  ? ?ASSESSMENT:  ? ?61 year old female with PMHx of COPD, emphysema, HTN, CAD, hx cocaine use, asthma, CHF, anxiety, hepatitis C, hx tracheostomy tube placement on 10/17/2017 on home vent, hx G-tube placement 10/31/2017 (now removed) who is admitted with COPD exacerbation, bateremia and sepsis. ?  ?Pt ventilated via chronic trach. Pt is well known to this RD from previous admissions. Pt reports good appetite pta; pt consumes 3 meals per day (Breakfast: honey grahams cereal; lunch: sandwich and chips; Dinner: meat, starch, and vegetable). Pt's daughter, who was at bedside, cooks for her. Pt denies any swallowing issues. Pt eats while on the ventilator and takes all food and meds orally. Pt intermittently takes protein shakes ("the cheap ones") and prefers chocolate flavor served cold. Pt is agreeable to protein shakes in hospital. Pt denies any weight loss. Per chart, pt appears weight stable at baseline. RD will add supplements and MVI to help pt meet her estimated needs.  ? ?Medications reviewed and include: aspirin, lovenox, ferrous sulfate, insulin, melatonin, remeron, protonix, solu-medrol, paxil, ceftriaxone, vitamin D ? ?Labs reviewed: K 3.8 wnl, BUN 21(H), P 3.0 wnl, Mg 2.2 wnl ?Hgb 10.3(L), Hct 33.4(L) ?Cbgs- 249, 215, 137 x 24 hrs ?AIC- 10.2(H)- 4/29 ? ?Patient is currently intubated on ventilator support ?MV: 8.0 L/min ?Temp (24hrs), Avg:98.8 ?F (37.1 ?C), Min:98.4 ?F (36.9 ?C), Max:99 ?F  (37.2 ?C) ? ?Propofol: none ? ?MAP- >45mHg ? ?UOP- 30060m ? ?NUTRITION - FOCUSED PHYSICAL EXAM: ? ?Flowsheet Row Most Recent Value  ?Orbital Region No depletion  ?Upper Arm Region No depletion  ?Thoracic and Lumbar Region No depletion  ?Buccal Region No depletion  ?Temple Region No depletion  ?Clavicle Bone Region No depletion  ?Clavicle and Acromion Bone Region No depletion  ?Scapular Bone Region No depletion  ?Dorsal Hand No depletion  ?Patellar Region Mild depletion  ?Anterior Thigh Region Mild depletion  ?Posterior Calf Region Mild depletion  ?Edema (RD Assessment) Mild  ?Hair Reviewed  ?Eyes Reviewed  ?Mouth Reviewed  ?Skin Reviewed  ?Nails Reviewed  ? ?Diet Order:   ?Diet Order   ? ?       ?  Diet regular Room service appropriate? Yes; Fluid consistency: Thin  Diet effective now       ?  ? ?  ?  ? ?  ? ?EDUCATION NEEDS:  ? ?Education needs have been addressed ? ?Skin:  Skin Assessment: Reviewed RN Assessment ? ?Last BM:  4/27 ? ?Height:  ? ?Ht Readings from Last 1 Encounters:  ?09/29/21 '5\' 2"'$  (1.575 m)  ? ? ?Weight:  ? ?Wt Readings from Last 1 Encounters:  ?10/01/21 80.1 kg  ? ? ?Ideal Body Weight:  50 kg ? ?BMI:  Body mass index is 32.3 kg/m?. ? ?Estimated Nutritional Needs:  ? ?Kcal:  1700-2000kcal/day ? ?Protein:  85-100g/day ? ?Fluid:  1.5-1.8L/day ? ?CaKoleen DistanceS, RD, LDN ?Please refer to AMION for RD and/or RD on-call/weekend/after hours pager ? ?

## 2021-10-02 NOTE — Progress Notes (Signed)
PHARMACY CONSULT NOTE - FOLLOW UP ? ?Pharmacy Consult for Electrolyte Monitoring and Replacement  ? ?Recent Labs: ?Potassium (mmol/L)  ?Date Value  ?10/02/2021 3.8  ?07/11/2014 4.6  ? ?Magnesium (mg/dL)  ?Date Value  ?10/02/2021 2.2  ?10/31/2013 2.6 (H)  ? ?Calcium (mg/dL)  ?Date Value  ?10/02/2021 8.6 (L)  ? ?Calcium, Total (mg/dL)  ?Date Value  ?07/11/2014 8.9  ? ?Albumin (g/dL)  ?Date Value  ?09/30/2021 4.3  ?07/09/2014 3.7  ? ?Phosphorus (mg/dL)  ?Date Value  ?10/02/2021 3.0  ?10/31/2013 4.6  ? ?Sodium (mmol/L)  ?Date Value  ?10/02/2021 144  ?07/11/2014 139  ? ? ? ?Assessment: ?61 year old female admitted with severe COPD exacerbation and group B streptococcus bacteremia. 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 ? ?Diet thin liquids ?On day 3 ceftriaxone and methylprednisolone 40 mg every 12 hours ? ?Goal of Therapy:  ?K 4-5 ?Mag > 2 ?All other electrolytes within normal limits ? ?Plan:  ?KCl packet 40 meq PO x 1 ?Follow up AM labs ? ? ?Wynelle Cleveland, PharmD ?Pharmacy Resident  ?10/02/2021 ?3:53 PM ? ? ?

## 2021-10-03 DIAGNOSIS — J9600 Acute respiratory failure, unspecified whether with hypoxia or hypercapnia: Secondary | ICD-10-CM | POA: Diagnosis not present

## 2021-10-03 DIAGNOSIS — J9622 Acute and chronic respiratory failure with hypercapnia: Secondary | ICD-10-CM | POA: Diagnosis not present

## 2021-10-03 DIAGNOSIS — J441 Chronic obstructive pulmonary disease with (acute) exacerbation: Secondary | ICD-10-CM | POA: Diagnosis not present

## 2021-10-03 DIAGNOSIS — J9621 Acute and chronic respiratory failure with hypoxia: Secondary | ICD-10-CM | POA: Diagnosis not present

## 2021-10-03 LAB — BASIC METABOLIC PANEL
Anion gap: 7 (ref 5–15)
BUN: 22 mg/dL — ABNORMAL HIGH (ref 6–20)
CO2: 28 mmol/L (ref 22–32)
Calcium: 8.8 mg/dL — ABNORMAL LOW (ref 8.9–10.3)
Chloride: 106 mmol/L (ref 98–111)
Creatinine, Ser: 0.63 mg/dL (ref 0.44–1.00)
GFR, Estimated: >60 mL/min (ref >60)
Glucose, Bld: 210 mg/dL — ABNORMAL HIGH (ref 70–99)
Potassium: 4 mmol/L (ref 3.5–5.1)
Sodium: 141 mmol/L (ref 135–145)

## 2021-10-03 LAB — CBC WITH DIFFERENTIAL/PLATELET
Abs Immature Granulocytes: 0.16 10*3/uL — ABNORMAL HIGH (ref 0.00–0.07)
Basophils Absolute: 0 10*3/uL (ref 0.0–0.1)
Basophils Relative: 0 %
Eosinophils Absolute: 0 10*3/uL (ref 0.0–0.5)
Eosinophils Relative: 0 %
HCT: 36.7 % (ref 36.0–46.0)
Hemoglobin: 11.2 g/dL — ABNORMAL LOW (ref 12.0–15.0)
Immature Granulocytes: 2 %
Lymphocytes Relative: 8 %
Lymphs Abs: 0.8 10*3/uL (ref 0.7–4.0)
MCH: 25.9 pg — ABNORMAL LOW (ref 26.0–34.0)
MCHC: 30.5 g/dL (ref 30.0–36.0)
MCV: 84.8 fL (ref 80.0–100.0)
Monocytes Absolute: 0.4 10*3/uL (ref 0.1–1.0)
Monocytes Relative: 4 %
Neutro Abs: 8.7 10*3/uL — ABNORMAL HIGH (ref 1.7–7.7)
Neutrophils Relative %: 86 %
Platelets: 277 10*3/uL (ref 150–400)
RBC: 4.33 MIL/uL (ref 3.87–5.11)
RDW: 13.9 % (ref 11.5–15.5)
WBC: 10.1 10*3/uL (ref 4.0–10.5)
nRBC: 0 % (ref 0.0–0.2)

## 2021-10-03 LAB — GLUCOSE, CAPILLARY
Glucose-Capillary: 164 mg/dL — ABNORMAL HIGH (ref 70–99)
Glucose-Capillary: 203 mg/dL — ABNORMAL HIGH (ref 70–99)
Glucose-Capillary: 197 mg/dL — ABNORMAL HIGH (ref 70–99)
Glucose-Capillary: 193 mg/dL — ABNORMAL HIGH (ref 70–99)
Glucose-Capillary: 185 mg/dL — ABNORMAL HIGH (ref 70–99)
Glucose-Capillary: 199 mg/dL — ABNORMAL HIGH (ref 70–99)

## 2021-10-03 LAB — PHOSPHORUS: Phosphorus: 2.5 mg/dL (ref 2.5–4.6)

## 2021-10-03 LAB — MAGNESIUM: Magnesium: 2.2 mg/dL (ref 1.7–2.4)

## 2021-10-03 MED ORDER — SODIUM CHLORIDE 0.9% FLUSH
10.0000 mL | Freq: Two times a day (BID) | INTRAVENOUS | Status: DC
  Administered 2021-10-03 – 2021-10-05 (×3): 10 mL

## 2021-10-03 MED ORDER — METHYLPREDNISOLONE SODIUM SUCC 40 MG IJ SOLR
40.0000 mg | Freq: Every day | INTRAMUSCULAR | Status: DC
  Administered 2021-10-04: 40 mg via INTRAVENOUS
  Filled 2021-10-03: qty 1

## 2021-10-03 MED ORDER — PANTOPRAZOLE 2 MG/ML SUSPENSION
40.0000 mg | Freq: Every day | ORAL | Status: DC
  Administered 2021-10-03 – 2021-10-04 (×2): 40 mg via ORAL
  Filled 2021-10-03 (×2): qty 20

## 2021-10-03 MED ORDER — SODIUM CHLORIDE 0.9% FLUSH: 10.0000 mL | INTRAVENOUS | Status: DC | PRN

## 2021-10-03 NOTE — TOC Initial Note (Signed)
Transition of Care (TOC) - Initial/Assessment Note  ? ? ?Patient Details  ?Name: Michelle [name] ?MRN: [id number] ?Date of Birth: 30-Mar-1961 ? ?Transition of Care (TOC) CM/SW Contact:    ?Shelbie Hutching, RN ?Phone Number: ?10/03/2021, 4:05 PM ? ?Clinical Narrative:                 ?Patient admitted with acute on chronic respiratory failure, history of COPD he has chronic trach and vent dependent.  Patient is from home with family.  Vent is from [employer]- patient thinks there may be a problem with the home vent, she has been doing well here on hospital vent settings.  RNCM reached out to [facility] with [facility], he will contact their respiratory team to reach out to see patient and vent.  Providers would like for patient to tolerate home vent overnight here in the hospital.   ? ?TOC will cont to follow.  ? ?Expected Discharge Plan: [place] ?Barriers to Discharge: Continued Medical Work up ? ? ?Patient Goals and CMS Choice ?  ?CMS Medicare.gov Compare Post Acute Care list provided to:: Patient ?Choice offered to / list presented to : Patient ? ?Expected Discharge Plan and Services ?Expected Discharge Plan: [place] ?  ?Discharge Planning Services: CM Consult ?Post Acute Care Choice: Resumption of Svcs/PTA Provider ?Living arrangements for the past 2 months: Apartment ?                ?DME Arranged: N/A ?DME Agency: NA ?  ?  ?  ?  ?  ?  ?  ?  ? ?Prior Living Arrangements/Services ?Living arrangements for the past 2 months: Apartment ?Lives with:: Adult Children ?Patient language and need for interpreter reviewed:: Yes ?Do you feel safe going back to the place where you live?: Yes      ?Need for Family Participation in Patient Care: Yes (Comment) ?Care giver support system in place?: Yes (comment) (children) ?Current home services: DME, Home RN (Home Vent- trach supplies) ?Criminal Activity/Legal Involvement Pertinent to Current Situation/Hospitalization: No - Comment as needed ? ?Activities of  Daily Living ?  ?  ? ?Permission Sought/Granted ?Permission sought to share information with : Case Manager, Family Supports, Other (comment) ?Permission granted to share information with : Yes, Verbal Permission Granted ? Share Information with NAME: Kennetha Pearman ? Permission granted to share info w AGENCY: [employer] ? Permission granted to share info w Relationship: daughter ? Permission granted to share info w Contact Information: [phone] ? ?Emotional Assessment ?Appearance:: Appears stated age ?Attitude/Demeanor/Rapport: Engaged ?Affect (typically observed): Accepting ?Orientation: : Oriented to Place, Oriented to Self, Oriented to  Time, Oriented to Situation ?Alcohol / Substance Use: Not Applicable ?Psych Involvement: No (comment) ? ?Admission diagnosis:  COPD exacerbation ([place]) [J44.1] ?Acute respiratory failure, unspecified whether with hypoxia or hypercapnia ([place]) [J96.00] ?Acute on chronic respiratory failure ([place]) [J96.20] ?Patient Active Problem List  ? Diagnosis Date Noted  ? Acute on chronic respiratory failure ([place]) 09/30/2021  ? Urine retention 02/07/2021  ? Candidiasis of breast 11/15/2020  ? Atypical pneumonia 08/29/2019  ? HCAP (healthcare-associated pneumonia) 08/28/2019  ? Pseudomonas respiratory infection 07/03/2019  ? Depression with anxiety 07/03/2019  ? Hyperlipidemia associated with type 2 diabetes mellitus ([place]) 07/03/2019  ? PEG (percutaneous endoscopic gastrostomy) status ([place]) 07/03/2019  ? Chronic pain 07/03/2019  ? Hypokalemia 07/03/2019  ? DM (diabetes mellitus) ([place]) 06/24/2019  ? Chronic respiratory failure requiring continuous mechanical ventilation through tracheostomy ([place]) 10/20/2018  ? CAD (coronary  artery disease) 10/20/2018  ? HTN (hypertension) 10/20/2018  ? Chronic diastolic CHF (congestive heart failure) ([place]) 10/20/2018  ? Pneumonia 09/17/2018  ? Panic disorder 10/29/2017  ? Altered mental status   ? Pressure injury of skin 10/23/2017  ? Acute on chronic respiratory  failure with hypoxia and hypercapnia ([place]) 06/29/2017  ? Acute on chronic respiratory failure with hypoxia ([place]) 02/23/2017  ? Protein-calorie malnutrition, severe 05/23/2016  ? Sepsis ([place]) 05/22/2016  ? Malnutrition of moderate degree ([place]) 10/25/2014  ? COPD exacerbation ([place]) 10/24/2014  ? Tobacco abuse 10/24/2014  ? Community acquired pneumonia 10/24/2014  ? ?PCP:  Alvester Chou, NP ?Pharmacy:   ?[place] [place] ?[place] ?[place] [address] ?Phone: [phone] Fax: [phone] ? ?[id number] [place] [address]?Phone: [phone]: [phone]? ?[id number] [place] [facility] [address] ?[address] ?[place]Phone: [phone]: [phone] ? ? ? ?Social Determinants of Health (SDOH) Interventions ?  ? ?Readmission Risk Interventions ? ?  10/03/2021  ?  3:58 PM 08/31/2019  ? 12:03 PM 08/07/2019  ?  2:21 PM  ?Readmission Risk Prevention Plan  ?Transportation Screening Complete Complete   ?PCP or Specialist Appt within 3-5 Days Complete    ?Social Work Consult for [place]/Counseling Complete    ?Palliative Care Screening Not Applicable    ?Medication Review (RPress photographerComplete Complete   ?PCP or Specialist appointment within 3-5 days of discharge  Complete Complete  ?[facility] Consult  Complete Complete  ?Palliative Care Screening  Complete Complete  ?Skilled Nursing Facility  Complete   ? ? ? ?

## 2021-10-03 NOTE — Progress Notes (Signed)

## 2021-10-03 NOTE — Progress Notes (Addendum)
Inpatient Diabetes Program Recommendations ? ?AACE/ADA: New Consensus Statement on Inpatient Glycemic Control (2015) ? ?Target Ranges:  Prepandial:   less than 140 mg/dL ?     Peak postprandial:   less than 180 mg/dL (1-2 hours) ?     Critically ill patients:  140 - 180 mg/dL  ? ?Lab Results  ?Component Value Date  ? GLUCAP 203 (H) 10/03/2021  ? HGBA1C 10.2 (H) 09/30/2021  ? ? ?Review of Glycemic Control ? Latest Reference Range & Units 10/02/21 07:23 10/02/21 11:56 10/02/21 16:36 10/02/21 19:37 10/02/21 23:43 10/03/21 04:16 10/03/21 07:25  ?Glucose-Capillary 70 - 99 mg/dL 215 (H) 249 (H) 239 (H) 303 (H) 210 (H) 164 (H) 203 (H)  ? ?Diabetes history: DM 2 ?Outpatient Diabetes medications: Levemir 14 units qhs, Novolog tid SSI, Trulicity 4.5 mg weekly ?Current orders for Inpatient glycemic control:  ?Levemir 14 units qhs ?Novolog 0-20 units Q4 hours ? ?Ensure max bid ?Solumedrol 40 mg Daily ? ?Inpatient Diabetes Program Recommendations:   ? ?-  Add Novolog 5 units tid meal coverage if eating >50% of meals ? ?Spoke with pt at bedside regarding A1c and glucose control at home. Pt reports her A1c is elevated due to hyperglycemia from recent steroids. Within the last 2 weeks. At baseline her A1c level is around an 8%. Pt reports her daughter gives her insulin as prescribed and checks glucose multiple times a day. Pt also reports taking steps to call her doctor when her glucose trends increased for titration of insulin, it was not enough of an adjustment for the trends to come down. Pt taking the right steps to control glucose tends at home. Encouraged follow up and continued close communication with her PCP for adjustments. ? ?Thanks, ? ?Tama Headings RN, MSN, BC-ADM ?Inpatient Diabetes Coordinator ?Team Pager [phone] (8a-5p) ?

## 2021-10-03 NOTE — Progress Notes (Signed)
PHARMACY CONSULT NOTE ? ?Pharmacy Consult for Electrolyte Monitoring and Replacement  ? ?Recent Labs: ?Potassium (mmol/L)  ?Date Value  ?10/03/2021 4.0  ?07/11/2014 4.6  ? ?Magnesium (mg/dL)  ?Date Value  ?10/03/2021 2.2  ?10/31/2013 2.6 (H)  ? ?Calcium (mg/dL)  ?Date Value  ?10/03/2021 8.8 (L)  ? ?Calcium, Total (mg/dL)  ?Date Value  ?07/11/2014 8.9  ? ?Albumin (g/dL)  ?Date Value  ?09/30/2021 4.3  ?07/09/2014 3.7  ? ?Phosphorus (mg/dL)  ?Date Value  ?10/03/2021 2.5  ?10/31/2013 4.6  ? ?Sodium (mmol/L)  ?Date Value  ?10/03/2021 141  ?07/11/2014 139  ? ?Assessment: ?61 year old female admitted with severe COPD exacerbation and group B streptococcus bacteremia. 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 ? ?Patient eats on ventilator ? ?Goal of Therapy:  ?Electrolytes within normal limits ? ?Plan:  ?--No electrolyte replacement indicated at this time; electrolytes have been stable overall ?--Will discontinue electrolyte consult at this time; defer further ordering of labs and electrolyte replacement to primary team ?--Pharmacy will continue to monitor peripherally ? ?Benita Gutter ?10/03/2021 ?12:13 PM ? ? ?

## 2021-10-03 NOTE — Progress Notes (Signed)
? ?NAME:  Michelle [name], MRN:  [id number], DOB:  [birth year], LOS: 3 ?ADMISSION DATE:  09/29/2021, CONSULTATION DATE:  09/30/21 ?REFERRING MD:  Dr. Beather Arbour, CHIEF COMPLAINT:  Respiratory Distress ? ?History of Present Illness:  ?61 year old female presenting to [facility] ED from home via EMS on 09/29/2021 with complaints of respiratory distress.  Per ED documentation patient arrived with nonrebreather mask in place, tripoding and guppy breathing.  Per the patient's report which is confirmed by daughter who is bedside, she developed a sore throat with a dry cough and fever on 09/28/2021.  By 09/29/2021 the patient started becoming short of breath.  Daughter states she tried saline treatments which helped for a little bit but did not completely relieve the shortness of breath.  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  She arrived at the ED with her home ventilator that she uses 24/7.  ?Patient does admit to chest tightness on arrival, but reports this is gone now.  She continues to feel short of breath and states that what has helped the most is Klonopin and morphine.  She endorses generalized abdominal pain and tenderness to touch, but denies nausea vomiting or diarrhea.  Patient also confirms she saw her doctor at the beginning of the month with complaints of shortness of breath and a productive cough.  Daughter confirms the patient was prescribed doxycycline course which she completed. ? ?ED course: ?Septic protocol initiated.  RT suctioned and placed patient on home ventilator.  Differential includes respiratory virus, COPD exacerbation, PE, pneumonia, CHF exacerbation, PTX, ACS ?medications given: Clonazepam, DuoNebs x3, 2.5 L LR bolus, Levaquin/vancomycin, Solu-Medrol, Mg IV, morphine IV, Zofran IV ?Initial Vitals: 98.8, tachypneic at 22, tachycardic 139, hypertensive 196/97 and 96% on home vent ?Significant labs: (Labs/ Imaging personally reviewed) ?I, Domingo Pulse [place],  AGACNP-BC, personally viewed and interpreted this ECG. ?EKG Interpretation: Date: 09/29/21, EKG Time: 23:55, Rate: 139, Rhythm: ST with RBBB(baseline), QRS Axis:  RAD, Intervals: RBBB, LPFB (unchanged from previous), ST/T Wave abnormalities: none,  ?Narrative Interpretation: ST with RBBB ?Chemistry: Na+: 141, K+: 3.6, BUN/Cr.: 10/0.61, Serum CO2/ AG: 19/14, Mg: 1.9, alk phos: 130 ?Hematology: WBC: 12.2, Hgb: 12.3,  ?Troponin: 16>29, BNP: 45.9, Lactic/ PCT: 1.9/ <0.10,  ?COVID-19 & Influenza A/B: negative ?ABG: 7.31/ 51/ 70/ 25.7 ?CXR 09/30/21: Chronic appearing increased interstitial lung marking without evidence of acute or active cardiopulmonary disease ?CT angio 09/30/21: pending ?CT abdomen/pelvis w contrast 09/30/21: pending ? ?PCCM consulted for admission due to acute on chronic respiratory failure requiring mechanical ventilatory support via tracheostomy. ? ?Pertinent  Medical History  ?COPD ?CHF ?CAD ?Hep C ?Tobacco abuse ?Cocaine abuse ?Asthma ?Anxiety ? ?Significant Hospital Events: ?Including procedures, antibiotic start and stop dates in addition to other pertinent events   ?09/30/21: Admit to ICU on home ventilator with acute on chronic respiratory failure. ?10/03/21: Remains on the ventilator.  No events overnight. ? ?Interim History / Subjective:  ?Patient alert and responsive, no complaints of pain or dyspnea today.  It appears her dyspnea occurs when she is placed on her home vent.  She has no dyspnea on our ICU vent. ? ?Objective   ?Blood pressure (!) 165/146, pulse 98, temperature 97.9 ?F (36.6 ?C), temperature source Oral, resp. rate 16, height _0  (1.575 m), weight 80.1 kg, last menstrual period 03/06/2005, SpO2 99 %. ?   ?Vent Mode: PRVC ?FiO2 (%):  [36 %] 36 % ?Set Rate:  [16 bmp] 16 bmp ?Vt Set:  [400 mL] 400 mL ?PEEP:  [  5 cmH20] 5 cmH20 ?Plateau Pressure:  [16 cmH20-17 cmH20] 16 cmH20  ? ?Intake/Output Summary (Last 24 hours) at 10/03/2021 2143 ?Last data filed at 10/03/2021 1800 ?Gross per 24 hour   ?Intake 240 ml  ?Output 800 ml  ?Net -560 ml  ? ? ?Filed Weights  ? 09/29/21 2357 10/01/21 0500  ?Weight: 73.5 kg 80.1 kg  ? ? ?Examination: ?General: Adult female, chronically ill, lying in bed requiring mechanical ventilation via tracheostomy ?HEENT: MM pink/moist, anicteric, atraumatic, neck supple ?Neuro: A&O x 4, able to follow commands, PERRL +3, MAE ?CV: s1s2 RRR, ST on monitor, no r/m/g ?Pulm: Regular, non-labored on ICU mechanical ventilator, breath sounds diminished throughout ?GI: soft, rounded, generalized tenderness to light palpation, bs x 4 ?Skin: limited exam- no rashes/lesions noted ?Extremities: warm/dry, pulses + 2 R/P, no edema noted ? ?Resolved Hospital Problem list   ? ? ?Assessment & Plan:  ?Acute on chronic hypoxic hypercapnic respiratory failure secondary to AECOPD ?PMHx: COPD with chronic tracheostomy and Asthma  ?- Full vent support for now: vent settings reviewed and established ?- Will attempt to transition back to home ventilator however, will need to have [employer] home health check unit as it appears that the patient's dyspnea occurs only when switched to the home ventilator, it appears this is a new unit for the patient ?- Wean PEEP & FiO2 as tolerated, maintain SpO2 88% to 92% ?- Head of bed elevated 30 degrees, VAP protocol in place ?- Plateau pressures less than 30 cm H20  ?- Intermittent chest x-ray & ABG PRN ?- Ensure adequate pulmonary hygiene  ?- F/u cultures, trend PCT ?- Continue solu-medrol 40 mg BID  ?- Budesonide nebs BID, Duo Neb Q 6, bronchodilators PRN ? ?HTN ?Hx: CHF, hypercholesteremia, CAD ?- Continuous telemetry monitoring  ?- Continue outpatient amlodipine, aspirin, atorvastatin, clonidine patch, and prn labetalol  ? ?Sepsis without septic shock ?    Blood cultures 04/29 aerobic bottle only>>group B strep (s.   ?    agalactiae) ?- Trend WBC and monitor fever curve  ?- Follow cultures  ?- Cefepime discontinued 05/1  ?- Ceftriaxone started 05/1 ? ?Abdominal pain  secondary to couging  ?- PRN percocet and morphine for pain management  ? ?Type 2 Diabetes Mellitus ?- CBG q4hr  ?- SSI and scheduled levemir  ?- target range while in ICU: 140-180 ?- follow ICU hyper/hypo-glycemia protocol ? ?Severe anxiety  ?Hx: Depression  ?- Continue outpatient antidepressants and antianxiety medications  ?- Prn valium for anxiety  ? ?Best Practice (right click and "Reselect all SmartList Selections" daily)  ?Diet/type: Regular ?DVT prophylaxis: LMWH ?GI prophylaxis: PPI ?Lines: N/A ?Foley:  N/A ?Code Status:  DNR ?Last date of multidisciplinary goals of care discussion [10/02/21] ? ?Labs   ?CBC: ?Recent Labs  ?Lab 09/30/21 ?7619 09/30/21 ?5093 10/01/21 ?2671 10/02/21 ?2458 10/03/21 ?0998  ?WBC 12.2* 12.8* 9.5 7.0 10.1  ?NEUTROABS 10.4*  --  8.1* 5.9 8.7*  ?HGB 12.3 10.5* 9.9* 10.3* 11.2*  ?HCT 40.2 34.5* 32.0* 33.4* 36.7  ?MCV 84.1 85.2 83.3 84.6 84.8  ?PLT 279 243 244 229 277  ? ? ? ?Basic Metabolic Panel: ?Recent Labs  ?Lab 09/30/21 ?0001 09/30/21 ?3382 10/01/21 ?5053 10/02/21 ?9767 10/03/21 ?3419  ?NA 141 137 141 144 141  ?K 3.6 4.2 4.4 3.8 4.0  ?CL 108 105 108 110 106  ?CO2 19* _0 ?GLUCOSE 218* 322* 223* 144* 210*  ?BUN _1 21* 22*  ?CREATININE 0.61 0.70 0.76 0.55 0.63  ?  CALCIUM 9.3 8.4* 8.7* 8.6* 8.8*  ?MG 1.9 2.0 2.2 2.2 2.2  ?PHOS  --  3.9 2.2* 3.0 2.5  ? ? ?GFR: ?Estimated Creatinine Clearance: 73.3 mL/min (by C-G formula based on SCr of 0.63 mg/dL). ?Recent Labs  ?Lab 09/30/21 ?0001 09/30/21 ?3291 09/30/21 ?0324 09/30/21 ?9166 09/30/21 ?0600 10/01/21 ?4599 10/02/21 ?7741 10/03/21 ?4239  ?PROCALCITON <0.10  --   --  <0.10  --  <0.10  --   --   ?WBC  --    < >  --  12.8*  --  9.5 7.0 10.1  ?LATICACIDVEN 1.9  --  2.1*  --  2.7*  --   --   --   ? < > = values in this interval not displayed.  ? ? ? ?Liver Function Tests: ?Recent Labs  ?Lab 09/30/21 ?0001  ?AST 19  ?ALT 15  ?ALKPHOS 130*  ?BILITOT 0.7  ?PROT 8.6*  ?ALBUMIN 4.3  ? ? ?No results for input(s): LIPASE, AMYLASE in the  last 168 hours. ?No results for input(s): AMMONIA in the last 168 hours. ? ?ABG ?   ?Component Value Date/Time  ? PHART 7.31 (L) 09/30/2021 0500  ? PCO2ART 46 09/30/2021 0500  ? PO2ART 155 (H) 09/30/2021 0500  ? HC

## 2021-10-03 NOTE — Progress Notes (Signed)
PHARMACIST - PHYSICIAN COMMUNICATION ? ?CONCERNING: IV to Oral Route Change Policy ? ?RECOMMENDATION: ?This patient is receiving pantoprazole by the intravenous route.  Based on criteria approved by the Pharmacy and Therapeutics Committee, the intravenous medication(s) is/are being converted to the equivalent oral dose form(s). ? ? ?DESCRIPTION: ?These criteria include: ?The patient is eating (either orally or via tube) and/or has been taking other orally administered medications for a least 24 hours ?The patient has no evidence of active gastrointestinal bleeding or impaired GI absorption (gastrectomy, short bowel, patient on TNA or NPO). ? ?If you have questions about this conversion, please contact the Pharmacy Department  ? ?Benita Gutter, RPH ?10/03/2021 12:13 PM  ?

## 2021-10-03 NOTE — Progress Notes (Signed)
Pt very anxious and BP elevated. Pt given IV Morphine and '10mg'$  Labetalol. Will reassess and monitor for changes.  ? 10/03/21 1400  ?Vitals  ?BP (!) 244/157  ?MAP (mmHg) 178  ?Pulse Rate (!) 125  ?ECG Heart Rate (!) 127  ?Resp (!) 21  ?Oxygen Therapy  ?SpO2 94 %  ?MEWS Score  ?MEWS Temp 0  ?MEWS Systolic 2  ?MEWS Pulse 2  ?MEWS RR 1  ?MEWS LOC 0  ?MEWS Score 5  ?MEWS Score Color Red  ? ? ?

## 2021-10-04 DIAGNOSIS — J441 Chronic obstructive pulmonary disease with (acute) exacerbation: Secondary | ICD-10-CM | POA: Diagnosis not present

## 2021-10-04 DIAGNOSIS — J9600 Acute respiratory failure, unspecified whether with hypoxia or hypercapnia: Secondary | ICD-10-CM | POA: Diagnosis not present

## 2021-10-04 LAB — GLUCOSE, CAPILLARY
Glucose-Capillary: 126 mg/dL — ABNORMAL HIGH (ref 70–99)
Glucose-Capillary: 209 mg/dL — ABNORMAL HIGH (ref 70–99)
Glucose-Capillary: 319 mg/dL — ABNORMAL HIGH (ref 70–99)
Glucose-Capillary: 332 mg/dL — ABNORMAL HIGH (ref 70–99)
Glucose-Capillary: 364 mg/dL — ABNORMAL HIGH (ref 70–99)
Glucose-Capillary: 99 mg/dL (ref 70–99)

## 2021-10-04 MED ORDER — INSULIN ASPART 100 UNIT/ML IJ SOLN
5.0000 [IU] | Freq: Three times a day (TID) | INTRAMUSCULAR | Status: DC
  Administered 2021-10-05 (×3): 5 [IU] via SUBCUTANEOUS
  Filled 2021-10-04 (×3): qty 1

## 2021-10-04 MED ORDER — PREDNISONE 10 MG PO TABS
10.0000 mg | ORAL_TABLET | Freq: Every day | ORAL | Status: DC
  Administered 2021-10-05: 10 mg via ORAL
  Filled 2021-10-04: qty 1

## 2021-10-04 MED ORDER — INSULIN DETEMIR 100 UNIT/ML ~~LOC~~ SOLN
20.0000 [IU] | Freq: Every day | SUBCUTANEOUS | Status: DC
  Administered 2021-10-04: 20 [IU] via SUBCUTANEOUS
  Filled 2021-10-04 (×2): qty 0.2

## 2021-10-04 MED ORDER — SODIUM CHLORIDE 0.9 % IV SOLN
2.0000 g | Freq: Once | INTRAVENOUS | Status: AC
  Administered 2021-10-04: 2 g via INTRAVENOUS
  Filled 2021-10-04: qty 20

## 2021-10-04 MED ORDER — INSULIN ASPART 100 UNIT/ML IJ SOLN
10.0000 [IU] | Freq: Once | INTRAMUSCULAR | Status: AC
  Administered 2021-10-04: 10 [IU] via SUBCUTANEOUS

## 2021-10-04 NOTE — Progress Notes (Signed)
[facility] Hospital Liaison note:  This patient is currently enrolled in [facility] outpatient-based Palliative Care. Will continue to follow for disposition.  Please call with any outpatient palliative questions or concerns.  Thank you, Dee Curry, LPN [facility] Hospital Liaison [phone] 

## 2021-10-04 NOTE — TOC Progression Note (Signed)
Transition of Care (TOC) - Progression Note  ? ? ?Patient Details  ?Name: Michelle [name] ?MRN: [id number] ?Date of Birth: 1961-02-11 ? ?Transition of Care (TOC) CM/SW Contact  ?Shelbie Hutching, RN ?Phone Number: ?10/04/2021, 11:12 AM ? ?Clinical Narrative:    ?Patient is medically stable here in the ICU, MD would like to discharge.  Zach with [employer] has reached out to their respiratory therapist, he has come up to the room and will change some of the settings on the home vent.  If patient tolerates home vent settings overnight patient can discharge tomorrow.  Patient will need EMS transport home, she reports her daughter will be there.   ?RNCM spoke with daughter, [name], she verbalizes understanding of discharge planning for tomorrow.  Patient does have home health [place] remember the name of the Company but they come out 2nd and 3rd shifts to the home daily.   ? ? ?Expected Discharge Plan: [place] ?Barriers to Discharge: Continued Medical Work up ? ?Expected Discharge Plan and Services ?Expected Discharge Plan: [place] ?  ?Discharge Planning Services: CM Consult ?Post Acute Care Choice: Resumption of Svcs/PTA Provider ?Living arrangements for the past 2 months: Apartment ?                ?DME Arranged: N/A ?DME Agency: NA ?  ?  ?  ?  ?  ?  ?  ?  ? ? ?Social Determinants of Health (SDOH) Interventions ?  ? ?Readmission Risk Interventions ? ?  10/03/2021  ?  3:58 PM 08/31/2019  ? 12:03 PM 08/07/2019  ?  2:21 PM  ?Readmission Risk Prevention Plan  ?Transportation Screening Complete Complete   ?PCP or Specialist Appt within 3-5 Days Complete    ?Social Work Consult for [place] Planning/Counseling Complete    ?Palliative Care Screening Not Applicable    ?Medication Review Press photographer) Complete Complete   ?PCP or Specialist appointment within 3-5 days of discharge  Complete Complete  ?[facility] or Home Care Consult  Complete Complete  ?Palliative Care Screening   Complete Complete  ?Skilled Nursing Facility  Complete   ? ? ?

## 2021-10-04 NOTE — Plan of Care (Signed)
  Problem: Education: Goal: Knowledge of General Education information will improve Description Including pain rating scale, medication(s)/side effects and non-pharmacologic comfort measures Outcome: Progressing   

## 2021-10-04 NOTE — Progress Notes (Signed)
? ?NAME:  Michelle [name], MRN:  [id number], DOB:  03-18-1961, LOS: 4 ?ADMISSION DATE:  09/29/2021, CONSULTATION DATE:  09/30/21 ?REFERRING MD:  Dr. Beather Arbour, CHIEF COMPLAINT:  Respiratory Distress ? ?History of Present Illness:  ?61 year old female presenting to [facility] ED from home via EMS on 09/29/2021 with complaints of respiratory distress.  Per ED documentation patient arrived with nonrebreather mask in place, tripoding and guppy breathing.  Per the patient's report which is confirmed by daughter who is bedside, she developed a sore throat with a dry cough and fever on 09/28/2021.  By 09/29/2021 the patient started becoming short of breath.  Daughter states she tried saline treatments which helped for a little bit but did not completely relieve the shortness of breath.  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  She arrived at the ED with her home ventilator that she uses 24/7.  ?Patient does admit to chest tightness on arrival, but reports this is gone now.  She continues to feel short of breath and states that what has helped the most is Klonopin and morphine.  She endorses generalized abdominal pain and tenderness to touch, but denies nausea vomiting or diarrhea.  Patient also confirms she saw her doctor at the beginning of the month with complaints of shortness of breath and a productive cough.  Daughter confirms the patient was prescribed doxycycline course which she completed. ? ?ED course: ?Septic protocol initiated.  RT suctioned and placed patient on home ventilator.  Differential includes respiratory virus, COPD exacerbation, PE, pneumonia, CHF exacerbation, PTX, ACS ?medications given: Clonazepam, DuoNebs x3, 2.5 L LR bolus, Levaquin/vancomycin, Solu-Medrol, Mg IV, morphine IV, Zofran IV ?Initial Vitals: 98.8, tachypneic at 22, tachycardic 139, hypertensive 196/97 and 96% on home vent ?Significant labs: (Labs/ Imaging personally reviewed) ?I, Domingo Pulse Rust-[place]ster,  AGACNP-BC, personally viewed and interpreted this ECG. ?EKG Interpretation: Date: 09/29/21, EKG Time: 23:55, Rate: 139, Rhythm: ST with RBBB(baseline), QRS Axis:  RAD, Intervals: RBBB, LPFB (unchanged from previous), ST/T Wave abnormalities: none,  ?Narrative Interpretation: ST with RBBB ?Chemistry: Na+: 141, K+: 3.6, BUN/Cr.: 10/0.61, Serum CO2/ AG: 19/14, Mg: 1.9, alk phos: 130 ?Hematology: WBC: 12.2, Hgb: 12.3,  ?Troponin: 16>29, BNP: 45.9, Lactic/ PCT: 1.9/ <0.10,  ?COVID-19 & Influenza A/B: negative ?ABG: 7.31/ 51/ 70/ 25.7 ?CXR 09/30/21: Chronic appearing increased interstitial lung marking without evidence of acute or active cardiopulmonary disease ?CT angio 09/30/21: pending ?CT abdomen/pelvis w contrast 09/30/21: pending ? ?PCCM consulted for admission due to acute on chronic respiratory failure requiring mechanical ventilatory support via tracheostomy. ? ?Pertinent  Medical History  ?COPD ?CHF ?CAD ?Hep C ?Tobacco abuse ?Cocaine abuse ?Asthma ?Anxiety ? ?Significant Hospital Events: ?Including procedures, antibiotic start and stop dates in addition to other pertinent events   ?09/30/21: Admit to ICU on home ventilator with acute on chronic respiratory failure. ?10/03/21: Remains on the ventilator.  No events overnight. ?5/3 Remains on the ventilator.  No events overnight. ? ?Interim History / Subjective:  ?Patient alert and responsive,  ?no complaints of pain or dyspnea today.   ?It appears her dyspnea occurs when she is placed on her home vent.   ?She has no dyspnea on our ICU vent. ?Plan for discharge planning ? ?Objective   ?Blood pressure 119/72, pulse 78, temperature 98.3 ?F (36.8 ?C), temperature source Oral, resp. rate 14, height 5' 2"  (1.575 m), weight 80.1 kg, last menstrual period 03/06/2005, SpO2 100 %. ?   ?Vent Mode: PRVC ?FiO2 (%):  [36 %] 36 % ?  Set Rate:  [16 bmp] 16 bmp ?Vt Set:  [400 mL] 400 mL ?PEEP:  [5 cmH20] 5 cmH20 ?Plateau Pressure:  [15 cmH20-16 cmH20] 15 cmH20  ? ?Intake/Output Summary  (Last 24 hours) at 10/04/2021 0738 ?Last data filed at 10/04/2021 0400 ?Gross per 24 hour  ?Intake 640 ml  ?Output 800 ml  ?Net -160 ml  ? ? ?Filed Weights  ? 09/29/21 2357 10/01/21 0500  ?Weight: 73.5 kg 80.1 kg  ? ?ROS  ?+chronic SOB ?Chronic vent ? ? ?Examination: ?General: Adult female, chronically ill, lying in bed requiring mechanical ventilation via tracheostomy ?HEENT: MM pink/moist, anicteric, atraumatic, neck supple ?Neuro: A&O x 4, able to follow commands, PERRL +3, MAE ?CV: s1s2 RRR, ST on monitor, no r/m/g ?Pulm:+cta b/l ?Skin: limited exam- no rashes/lesions noted ?Extremities: warm/dry, pulses + 2 R/P, no edema noted ? ? ? ?Assessment & Plan:  ?Acute on chronic hypoxic hypercapnic respiratory failure secondary to AECOPD ?PMHx: COPD with chronic tracheostomy and Asthma  ? ?Severe ACUTE Hypoxic and Hypercapnic Respiratory Failure ?-continue Mechanical Ventilator support ?-Wean Fio2 and PEEP as tolerated ?-VAP/VENT bundle implementation ?- Wean PEEP & FiO2 as tolerated, maintain SpO2 > 88% ?- Head of bed elevated 30 degrees, VAP protocol in place ?- Plateau pressures less than 30 cm H20  ?- Intermittent chest x-ray & ABG PRN ?- Ensure adequate pulmonary hygiene  ?Home vent settings ? ?SEVERE COPD EXACERBATION ?-continue  steroids as prescribed ?-continue NEB THERAPY as prescribed ?-morphine as needed ?-wean fio2 as needed and tolerated ?- Full vent support for now: vent settings reviewed and established ?- Will attempt to transition back to home ventilator however, will need to have [employer] home health check unit as it appears that the patient's dyspnea occurs only when switched to the home ventilator, it appears this is a new unit for the patient ?- Wean PEEP & FiO2 as tolerated, maintain SpO2 88% to 92% ?- Head of bed elevated 30 degrees, VAP protocol in place ?- Plateau pressures less than 30 cm H20  ?- Intermittent chest x-ray & ABG PRN ?- Ensure adequate pulmonary hygiene  ?- F/u cultures, trend PCT ?-  Budesonide nebs BID, Duo Neb Q 6, bronchodilators PRN ? ? ?Sepsis without septic shock ?    Blood cultures 04/29 aerobic bottle only>>group B strep (s.   ?    agalactiae) ?- Trend WBC and monitor fever curve  ?- Follow cultures  ?- Cefepime discontinued 05/1  ?- Ceftriaxone started 05/1 ? ? ?ENDO ?- ICU hypoglycemic\Hyperglycemia protocol ?-check FSBS per protocol ? ? ?GI ?GI PROPHYLAXIS as indicated ? ?NUTRITIONAL STATUS ?DIET-->eating while on vent ?Constipation protocol as indicated ? ? ?ELECTROLYTES ?-follow labs as needed ?-replace as needed ?-pharmacy consultation and following ? ? ?Severe anxiety  ?Hx: Depression  ?- Continue outpatient antidepressants and antianxiety medications  ?- Prn valium for anxiety  ? ? ? ? ?Best Practice (right click and "Reselect all SmartList Selections" daily)  ?Diet/type: Regular ?DVT prophylaxis: LMWH ?GI prophylaxis: PPI ?Lines: N/A ?Foley:  N/A ?Code Status:  DNR ? ? ?Labs   ?CBC: ?Recent Labs  ?Lab 09/30/21 ?2952 09/30/21 ?8413 10/01/21 ?2440 10/02/21 ?1027 10/03/21 ?2536  ?WBC 12.2* 12.8* 9.5 7.0 10.1  ?NEUTROABS 10.4*  --  8.1* 5.9 8.7*  ?HGB 12.3 10.5* 9.9* 10.3* 11.2*  ?HCT 40.2 34.5* 32.0* 33.4* 36.7  ?MCV 84.1 85.2 83.3 84.6 84.8  ?PLT 279 243 244 229 277  ? ? ? ?Basic Metabolic Panel: ?Recent Labs  ?Lab 09/30/21 ?0001 09/30/21 ?6440  10/01/21 ?2072 10/02/21 ?1828 10/03/21 ?8337  ?NA 141 137 141 144 141  ?K 3.6 4.2 4.4 3.8 4.0  ?CL 108 105 108 110 106  ?CO2 19* 22 26 29 28   ?GLUCOSE 218* 322* 223* 144* 210*  ?BUN 10 9 16  21* 22*  ?CREATININE 0.61 0.70 0.76 0.55 0.63  ?CALCIUM 9.3 8.4* 8.7* 8.6* 8.8*  ?MG 1.9 2.0 2.2 2.2 2.2  ?PHOS  --  3.9 2.2* 3.0 2.5  ? ? ?GFR: ?Estimated Creatinine Clearance: 73.3 mL/min (by C-G formula based on SCr of 0.63 mg/dL). ?Recent Labs  ?Lab 09/30/21 ?0001 09/30/21 ?4451 09/30/21 ?0324 09/30/21 ?4604 09/30/21 ?7998 10/01/21 ?7215 10/02/21 ?8727 10/03/21 ?6184  ?PROCALCITON <0.10  --   --  <0.10  --  <0.10  --   --   ?WBC  --    < >  --  12.8*   --  9.5 7.0 10.1  ?LATICACIDVEN 1.9  --  2.1*  --  2.7*  --   --   --   ? < > = values in this interval not displayed.  ? ? ? ?Liver Function Tests: ? ?ABG ?   ?Component Value Date/Time  ? PHART 7.31 (L) 04/29

## 2021-10-05 DIAGNOSIS — J9600 Acute respiratory failure, unspecified whether with hypoxia or hypercapnia: Secondary | ICD-10-CM | POA: Diagnosis not present

## 2021-10-05 DIAGNOSIS — J441 Chronic obstructive pulmonary disease with (acute) exacerbation: Secondary | ICD-10-CM | POA: Diagnosis not present

## 2021-10-05 LAB — GLUCOSE, CAPILLARY
Glucose-Capillary: 78 mg/dL (ref 70–99)
Glucose-Capillary: 93 mg/dL (ref 70–99)
Glucose-Capillary: 226 mg/dL — ABNORMAL HIGH (ref 70–99)
Glucose-Capillary: 323 mg/dL — ABNORMAL HIGH (ref 70–99)

## 2021-10-05 LAB — BASIC METABOLIC PANEL
Anion gap: 8 (ref 5–15)
BUN: 27 mg/dL — ABNORMAL HIGH (ref 6–20)
CO2: 30 mmol/L (ref 22–32)
Calcium: 8.5 mg/dL — ABNORMAL LOW (ref 8.9–10.3)
Chloride: 104 mmol/L (ref 98–111)
Creatinine, Ser: 0.78 mg/dL (ref 0.44–1.00)
GFR, Estimated: >60 mL/min (ref >60)
Glucose, Bld: 89 mg/dL (ref 70–99)
Potassium: 3 mmol/L — ABNORMAL LOW (ref 3.5–5.1)
Sodium: 142 mmol/L (ref 135–145)

## 2021-10-05 LAB — CBC
HCT: 37.1 % (ref 36.0–46.0)
Hemoglobin: 11.2 g/dL — ABNORMAL LOW (ref 12.0–15.0)
MCH: 25.6 pg — ABNORMAL LOW (ref 26.0–34.0)
MCHC: 30.2 g/dL (ref 30.0–36.0)
MCV: 84.7 fL (ref 80.0–100.0)
Platelets: 276 10*3/uL (ref 150–400)
RBC: 4.38 MIL/uL (ref 3.87–5.11)
RDW: 13.8 % (ref 11.5–15.5)
WBC: 11.3 10*3/uL — ABNORMAL HIGH (ref 4.0–10.5)
nRBC: 0 % (ref 0.0–0.2)

## 2021-10-05 LAB — CULTURE, BLOOD (ROUTINE X 2)
Culture: NO GROWTH
Special Requests: ADEQUATE

## 2021-10-05 LAB — MAGNESIUM: Magnesium: 2.1 mg/dL (ref 1.7–2.4)

## 2021-10-05 LAB — PHOSPHORUS: Phosphorus: 3.1 mg/dL (ref 2.5–4.6)

## 2021-10-05 MED ORDER — POTASSIUM CHLORIDE CRYS ER 20 MEQ PO TBCR
40.0000 meq | EXTENDED_RELEASE_TABLET | Freq: Once | ORAL | Status: AC
  Administered 2021-10-05: 40 meq via ORAL
  Filled 2021-10-05: qty 2

## 2021-10-05 MED ORDER — SODIUM CHLORIDE 0.9 % IV SOLN
2.0000 g | Freq: Once | INTRAVENOUS | Status: AC
  Administered 2021-10-05: 2 g via INTRAVENOUS
  Filled 2021-10-05: qty 2

## 2021-10-05 MED ORDER — POTASSIUM CHLORIDE 10 MEQ/100ML IV SOLN
10.0000 meq | INTRAVENOUS | Status: AC
  Administered 2021-10-05 (×4): 10 meq via INTRAVENOUS
  Filled 2021-10-05 (×4): qty 100

## 2021-10-05 MED ORDER — CEPHALEXIN 500 MG PO CAPS: 500.0000 mg | ORAL_CAPSULE | Freq: Four times a day (QID) | ORAL | 0 refills | Status: AC

## 2021-10-05 NOTE — Progress Notes (Signed)
? ?NAME:  Michelle [name], MRN:  [id number], DOB:  04/07/1961, LOS: 5 ?ADMISSION DATE:  09/29/2021, CONSULTATION DATE:  09/30/21 ?REFERRING MD:  Dr. Beather Arbour, CHIEF COMPLAINT:  Respiratory Distress ? ?History of Present Illness:  ?61 year old female presenting to [facility] ED from home via EMS on 09/29/2021 with complaints of respiratory distress.  Per ED documentation patient arrived with nonrebreather mask in place, tripoding and guppy breathing.  Per the patient's report which is confirmed by daughter who is bedside, she developed a sore throat with a dry cough and fever on 09/28/2021.  By 09/29/2021 the patient started becoming short of breath.  Daughter states she tried saline treatments which helped for a little bit but did not completely relieve the shortness of breath.  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  She arrived at the ED with her home ventilator that she uses 24/7.  ?Patient does admit to chest tightness on arrival, but reports this is gone now.  She continues to feel short of breath and states that what has helped the most is Klonopin and morphine.  She endorses generalized abdominal pain and tenderness to touch, but denies nausea vomiting or diarrhea.  Patient also confirms she saw her doctor at the beginning of the month with complaints of shortness of breath and a productive cough.  Daughter confirms the patient was prescribed doxycycline course which she completed. ? ?ED course: ?Septic protocol initiated.  RT suctioned and placed patient on home ventilator.  Differential includes respiratory virus, COPD exacerbation, PE, pneumonia, CHF exacerbation, PTX, ACS ?medications given: Clonazepam, DuoNebs x3, 2.5 L LR bolus, Levaquin/vancomycin, Solu-Medrol, Mg IV, morphine IV, Zofran IV ?Initial Vitals: 98.8, tachypneic at 22, tachycardic 139, hypertensive 196/97 and 96% on home vent ?Significant labs: (Labs/ Imaging personally reviewed) ?I, Domingo Pulse Rust-[place],  AGACNP-BC, personally viewed and interpreted this ECG. ?EKG Interpretation: Date: 09/29/21, EKG Time: 23:55, Rate: 139, Rhythm: ST with RBBB(baseline), QRS Axis:  RAD, Intervals: RBBB, LPFB (unchanged from previous), ST/T Wave abnormalities: none,  ?Narrative Interpretation: ST with RBBB ?Chemistry: Na+: 141, K+: 3.6, BUN/Cr.: 10/0.61, Serum CO2/ AG: 19/14, Mg: 1.9, alk phos: 130 ?Hematology: WBC: 12.2, Hgb: 12.3,  ?Troponin: 16>29, BNP: 45.9, Lactic/ PCT: 1.9/ <0.10,  ?COVID-19 & Influenza A/B: negative ?ABG: 7.31/ 51/ 70/ 25.7 ?CXR 09/30/21: Chronic appearing increased interstitial lung marking without evidence of acute or active cardiopulmonary disease ?CT angio 09/30/21: pending ?CT abdomen/pelvis w contrast 09/30/21: pending ? ?PCCM consulted for admission due to acute on chronic respiratory failure requiring mechanical ventilatory support via tracheostomy. ? ?Pertinent  Medical History  ?COPD ?CHF ?CAD ?Hep C ?Tobacco abuse ?Cocaine abuse ?Asthma ?Anxiety ? ?Significant Hospital Events: ?Including procedures, antibiotic start and stop dates in addition to other pertinent events   ?09/30/21: Admit to ICU on home ventilator with acute on chronic respiratory failure. ?10/03/21: Remains on the ventilator.  No events overnight. ?5/3 Remains on the ventilator.  No events overnight. ? ?Interim History / Subjective:  ?Patient alert and responsive,  ?no complaints of pain or dyspnea today.   ?[place] ?SIMV PS 10 PEEP 5 Rate 16, TV 400, I time 0.8S ?She has no dyspnea on our ICU vent. ?Plan for discharge planning and d/c home today ? ?Objective   ?Blood pressure 103/73, pulse 71, temperature 97.7 ?F (36.5 ?C), temperature source Axillary, resp. rate 16, height 5' 2"  (1.575 m), weight 80.1 kg, last menstrual period 03/06/2005, SpO2 99 %. ?   ?Vent Mode: SIMV ?FiO2 (%):  [  36 %] 36 % ?Set Rate:  [16 bmp] 16 bmp ?Vt Set:  [400 mL] 400 mL ?PEEP:  [5 cmH20] 5 cmH20 ?Pressure Support:  [10 cmH20] 10 cmH20   ? ?Intake/Output Summary (Last 24 hours) at 10/05/2021 0720 ?Last data filed at 10/05/2021 0600 ?Gross per 24 hour  ?Intake 500 ml  ?Output 950 ml  ?Net -450 ml  ? ? ?Filed Weights  ? 09/29/21 2357 10/01/21 0500  ?Weight: 73.5 kg 80.1 kg  ? ?ROS  ?+chronic SOB ?Chronic vent ? ? ?Examination: ?General: Adult female, chronically ill, lying in bed requiring mechanical ventilation via tracheostomy ?Neuro: A&O x 4, able to follow commands, PERRL +3, MAE ?CV: s1s2 RRR, ST on monitor, no r/m/g ?Pulm:+cta b/l ? ? ? ? ?Assessment & Plan:  ?Acute on chronic hypoxic hypercapnic respiratory failure secondary to AECOPD ?PMHx: COPD with chronic tracheostomy and Asthma  ? ?Severe ACUTE Hypoxic and Hypercapnic Respiratory Failure ?-continue Mechanical Ventilator support ?-Wean Fio2 and PEEP as tolerated ?-VAP/VENT bundle implementation ?- Wean PEEP & FiO2 as tolerated, maintain SpO2 > 88% ?- Head of bed elevated 30 degrees, VAP protocol in place ?- Plateau pressures less than 30 cm H20  ?- Intermittent chest x-ray & ABG PRN ?- Ensure adequate pulmonary hygiene  ?Home vent settings ?Plan for d/c home today ? ?SEVERE COPD EXACERBATION ?Much improved ? ?Sepsis without septic shock ?    Blood cultures 04/29 aerobic bottle only>>group B strep (s.   ?    agalactiae) ?- Trend WBC and monitor fever curve  ?- Follow cultures  ?Completed ABX ? ? ?ENDO ?- ICU hypoglycemic\Hyperglycemia protocol ?-check FSBS per protocol ? ? ?GI ?GI PROPHYLAXIS as indicated ? ?NUTRITIONAL STATUS ?DIET-->eating while on vent ?Constipation protocol as indicated ? ? ?ELECTROLYTES ?-follow labs as needed ?-replace as needed ?-pharmacy consultation and following ? ? ?Severe anxiety  ?Hx: Depression  ?- Continue outpatient antidepressants and antianxiety medications  ?- Prn valium for anxiety  ? ? ? ? ?Best Practice (right click and "Reselect all SmartList Selections" daily)  ?Diet/type: Regular ?DVT prophylaxis: LMWH ?GI prophylaxis: PPI ?Lines: N/A ?Foley:  N/A ?Code  Status:  DNR ? ? ?Labs   ?CBC: ?Recent Labs  ?Lab 09/30/21 ?1191 09/30/21 ?4782 10/01/21 ?9562 10/02/21 ?1308 10/03/21 ?6578 10/05/21 ?0500  ?WBC 12.2* 12.8* 9.5 7.0 10.1 11.3*  ?NEUTROABS 10.4*  --  8.1* 5.9 8.7*  --   ?HGB 12.3 10.5* 9.9* 10.3* 11.2* 11.2*  ?HCT 40.2 34.5* 32.0* 33.4* 36.7 37.1  ?MCV 84.1 85.2 83.3 84.6 84.8 84.7  ?PLT 279 243 244 229 277 276  ? ? ? ?Basic Metabolic Panel: ?Recent Labs  ?Lab 09/30/21 ?4696 10/01/21 ?2952 10/02/21 ?8413 10/03/21 ?2440 10/05/21 ?0500  ?NA 137 141 144 141 142  ?K 4.2 4.4 3.8 4.0 3.0*  ?CL 105 108 110 106 104  ?CO2 22 26 29 28 30   ?GLUCOSE 322* 223* 144* 210* 89  ?BUN 9 16 21* 22* 27*  ?CREATININE 0.70 0.76 0.55 0.63 0.78  ?CALCIUM 8.4* 8.7* 8.6* 8.8* 8.5*  ?MG 2.0 2.2 2.2 2.2 2.1  ?PHOS 3.9 2.2* 3.0 2.5 3.1  ? ? ?GFR: ?Estimated Creatinine Clearance: 73.3 mL/min (by C-G formula based on SCr of 0.78 mg/dL). ?Recent Labs  ?Lab 09/30/21 ?0001 09/30/21 ?1027 09/30/21 ?0324 09/30/21 ?2536 09/30/21 ?6440 10/01/21 ?3474 10/02/21 ?2595 10/03/21 ?6387 10/05/21 ?0500  ?PROCALCITON <0.10  --   --  <0.10  --  <0.10  --   --   --   ?WBC  --    < >  --  12.8*  --  9.5 7.0 10.1 11.3*  ?LATICACIDVEN 1.9  --  2.1*  --  2.7*  --   --   --   --   ? < > = values in this interval not displayed.  ? ? ? ?Liver Function Tests: ? ?ABG ?   ?Component Value Date/Time  ? PHART 7.31 (L) 09/30/2021 0500  ? PCO2ART 46 09/30/2021 0500  ? PO2ART 155 (H) 09/30/2021 0500  ? HCO3 23.2 09/30/2021 0500  ? ACIDBASEDEF 3.3 (H) 09/30/2021 0500  ? O2SAT 100 09/30/2021 0500  ? ?  ? ?Coagulation Profile: ?Recent Labs  ?Lab 09/30/21 ?0010  ?INR 1.0  ? ? ? ?Cardiac Enzymes: ?No results for input(s): CKTOTAL, CKMB, CKMBINDEX, TROPONINI in the last 168 hours. ? ?HbA1C: ?Hemoglobin A1C  ?Date/Time Value Ref Range Status  ?06/29/2013 04:02 AM 6.0 4.2 - 6.3 % Final  ?  Comment:  ?  The American Diabetes Association recommends that a primary goal of ?therapy should be <7% and that physicians should reevaluate  the ?treatment regimen in patients with HbA1c values consistently >8%. ?  ? ?Hgb A1c MFr Bld  ?Date/Time Value Ref Range Status  ?09/30/2021 05:52 AM 10.2 (H) 4.8 - 5.6 % Final  ?  Comment:  ?  (NOTE) ?Pre diabetes:

## 2021-10-05 NOTE — TOC Progression Note (Signed)
Transition of Care (TOC) - Progression Note  ? ? ?Patient Details  ?Name: Michelle [name] ?MRN: [id number] ?Date of Birth: November 29, 1960 ? ?Transition of Care (TOC) CM/SW Contact  ?Shelbie Hutching, RN ?Phone Number: ?10/05/2021, 4:42 PM ? ?Clinical Narrative:    ?RNCM spoke with daughter [name], she is aware of discharge today, EMS called, she will be home.  ? ? ?Expected Discharge Plan: [place] ?Barriers to Discharge: Continued Medical Work up (trach change) ? ?Expected Discharge Plan and Services ?Expected Discharge Plan: [place] ?  ?Discharge Planning Services: CM Consult ?Post Acute Care Choice: Resumption of Svcs/PTA Provider ?Living arrangements for the past 2 months: Apartment ?                ?DME Arranged: N/A ?DME Agency: NA ?  ?  ?  ?  ?  ?  ?  ?  ? ? ?Social Determinants of Health (SDOH) Interventions ?  ? ?Readmission Risk Interventions ? ?  10/03/2021  ?  3:58 PM 08/31/2019  ? 12:03 PM 08/07/2019  ?  2:21 PM  ?Readmission Risk Prevention Plan  ?Transportation Screening Complete Complete   ?PCP or Specialist Appt within 3-5 Days Complete    ?Social Work Consult for [place] Planning/Counseling Complete    ?Palliative Care Screening Not Applicable    ?Medication Review Press photographer) Complete Complete   ?PCP or Specialist appointment within 3-5 days of discharge  Complete Complete  ?[place] or Home Care Consult  Complete Complete  ?Palliative Care Screening  Complete Complete  ?Skilled Nursing Facility  Complete   ? ? ?

## 2021-10-05 NOTE — TOC Transition Note (Signed)
Transition of Care (TOC) - CM/SW Discharge Note ? ? ?Patient Details  ?Name: Michelle [name] ?MRN: [id number] ?Date of Birth: 04-04-1961 ? ?Transition of Care (TOC) CM/SW Contact:  ?Shelbie Hutching, RN ?Phone Number: ?10/05/2021, 2:06 PM ? ? ?Clinical Narrative:    ?[facility] transport called, patient is 5th on the list for pick up.  Called and left daughter message that EMS has been called.   ? ? ?Final next level of care: [place] ?Barriers to Discharge: Continued Medical Work up (trach change) ? ? ?Patient Goals and CMS Choice ?Patient states their goals for this hospitalization and ongoing recovery are:: to get back home ?CMS Medicare.gov Compare Post Acute Care list provided to:: Patient ?Choice offered to / list presented to : Patient ? ?Discharge Placement ?  ?           ?  ?Patient to be transferred to facility by: Transport home via [facility] ?Name of family member notified: [name] ?Patient and family notified of of transfer: 10/05/21 ? ?Discharge Plan and Services ?  ?Discharge Planning Services: CM Consult ?Post Acute Care Choice: Resumption of Svcs/PTA Provider          ?DME Arranged: N/A ?DME Agency: NA ?  ?  ?  ?  ?  ?  ?  ?  ? ?Social Determinants of Health (SDOH) Interventions ?  ? ? ?Readmission Risk Interventions ? ?  10/03/2021  ?  3:58 PM 08/31/2019  ? 12:03 PM 08/07/2019  ?  2:21 PM  ?Readmission Risk Prevention Plan  ?Transportation Screening Complete Complete   ?PCP or Specialist Appt within 3-5 Days Complete    ?Social Work Consult for [place] Planning/Counseling Complete    ?Palliative Care Screening Not Applicable    ?Medication Review Press photographer) Complete Complete   ?PCP or Specialist appointment within 3-5 days of discharge  Complete Complete  ?[place] or Home Care Consult  Complete Complete  ?Palliative Care Screening  Complete Complete  ?Skilled Nursing Facility  Complete   ? ? ? ? ? ?

## 2021-10-05 NOTE — Progress Notes (Signed)
Patient dc home with EMS.  ?All belongings sent with patient and EMS. Vitals WDL at time of DC.  ?Patient on home vent at time of DC.  ?

## 2021-10-05 NOTE — Discharge Summary (Signed)
Physician Discharge Summary  ?Patient ID: ?Michelle [name] ?MRN: [id number] ?DOB/AGE: 11-26-1960 61 y.o. ? ?Admit date: 09/29/2021 ?Discharge date: 10/05/2021 ? ?Admission Diagnoses: ? ?Discharge Diagnoses:  ?Principal Problem: ?  Acute on chronic respiratory failure ([place]) ? ? ?Discharged Condition: stable ? ?Hospital Course:  ?History of Present Illness:  ?61 year old female presenting to [facility] ED from home via EMS on 09/29/2021 with complaints of respiratory distress.  Per ED documentation patient arrived with nonrebreather mask in place, tripoding and guppy breathing.  Per the patient's report which is confirmed by daughter who is bedside, she developed a sore throat with a dry cough and fever on 09/28/2021.  By 09/29/2021 the patient started becoming short of breath.  Daughter states she tried saline treatments which helped for a little bit but did not completely relieve the shortness of breath.  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  She arrived at the ED with her home ventilator that she uses 24/7.  ?Patient does admit to chest tightness on arrival, but reports this is gone now.  She continues to feel short of breath and states that what has helped the most is Klonopin and morphine.  She endorses generalized abdominal pain and tenderness to touch, but denies nausea vomiting or diarrhea.  Patient also confirms she saw her doctor at the beginning of the month with complaints of shortness of breath and a productive cough.  Daughter confirms the patient was prescribed doxycycline course which she completed. ?  ?ED course: ?Septic protocol initiated.  RT suctioned and placed patient on home ventilator.  Differential includes respiratory virus, COPD exacerbation, PE, pneumonia, CHF exacerbation, PTX, ACS ?medications given: Clonazepam, DuoNebs x3, 2.5 L LR bolus, Levaquin/vancomycin, Solu-Medrol, Mg IV, morphine IV, Zofran IV ?Initial Vitals: 98.8, tachypneic at 22, tachycardic 139,  hypertensive 196/97 and 96% on home vent ?Significant labs: (Labs/ Imaging personally reviewed) ?I, Domingo Pulse [place], AGACNP-BC, personally viewed and interpreted this ECG. ?EKG Interpretation: Date: 09/29/21, EKG Time: 23:55, Rate: 139, Rhythm: ST with RBBB(baseline), QRS Axis:  RAD, Intervals: RBBB, LPFB (unchanged from previous), ST/T Wave abnormalities: none,  ?Narrative Interpretation: ST with RBBB ?Chemistry: Na+: 141, K+: 3.6, BUN/Cr.: 10/0.61, Serum CO2/ AG: 19/14, Mg: 1.9, alk phos: 130 ?Hematology: WBC: 12.2, Hgb: 12.3,  ?Troponin: 16>29, BNP: 45.9, Lactic/ PCT: 1.9/ <0.10,  ?COVID-19 & Influenza A/B: negative ?ABG: 7.31/ 51/ 70/ 25.7 ?CXR 09/30/21: Chronic appearing increased interstitial lung marking without evidence of acute or active cardiopulmonary disease ?CT angio 09/30/21: pending ?CT abdomen/pelvis w contrast 09/30/21: pending ?  ?PCCM consulted for admission due to acute on chronic respiratory failure requiring mechanical ventilatory support via tracheostomy. ?  ?Pertinent  Medical History  ?COPD ?CHF ?CAD ?Hep C ?Tobacco abuse ?Cocaine abuse ?Asthma ?Anxiety ?  ?Significant Hospital Events: ?Including procedures, antibiotic start and stop dates in addition to other pertinent events   ?09/30/21: Admit to ICU on home ventilator with acute on chronic respiratory failure. ?10/03/21: Remains on the ventilator.  No events overnight. ?5/3 Remains on the ventilator.  No events overnight. ?  ?Interim History / Subjective:  ?Patient alert and responsive,  ?no complaints of pain or dyspnea today.   ?[place] ?SIMV PS 10 PEEP 5 Rate 16, TV 400, I time 0.8S ?She has no dyspnea on our ICU vent. ?Plan for discharge planning and d/c home today ?  ?Objective   ?Blood pressure 103/73, pulse 71, temperature 97.7 ?F (36.5 ?C), temperature source Axillary, resp. rate 16, height 5'  2" (1.575 m), weight 80.1 kg, last menstrual period 03/06/2005, SpO2 99 %. ?   ? ?>  ?Vent Mode: SIMV ?FiO2 (%):   [36 %] 36 % ?Set Rate:  [16 bmp] 16 bmp ?Vt Set:  [400 mL] 400 mL ?PEEP:  [5 cmH20] 5 cmH20 ?Pressure Support:  [10 cmH20] 10 cmH20  ?  ?  ?Intake/Output Summary (Last 24 hours) at 10/05/2021 0720 ?Last data filed at 10/05/2021 0600 ?   ?Gross per 24 hour  ?Intake 500 ml  ?Output 950 ml  ?Net -450 ml  ?  ?  ?    ?Filed Weights  ?  09/29/21 2357 10/01/21 0500  ?Weight: 73.5 kg 80.1 kg  ?  ?ROS  ?+chronic SOB ?Chronic vent ?  ?  ?Examination: ?General: Adult female, chronically ill, lying in bed requiring mechanical ventilation via tracheostomy ?Neuro: A&O x 4, able to follow commands, PERRL +3, MAE ?CV: s1s2 RRR, ST on monitor, no r/m/g ?Pulm:+cta b/l ?  ?  ?  ?  ?Assessment & Plan:  ?Acute on chronic hypoxic hypercapnic respiratory failure secondary to AECOPD ?PMHx: COPD with chronic tracheostomy and Asthma  ?  ?Severe ACUTE Hypoxic and Hypercapnic Respiratory Failure ?-continue Mechanical Ventilator support ?-Wean Fio2 and PEEP as tolerated ?-VAP/VENT bundle implementation ?- Wean PEEP & FiO2 as tolerated, maintain SpO2 > 88% ?- Head of bed elevated 30 degrees, VAP protocol in place ?- Plateau pressures less than 30 cm H20  ?- Intermittent chest x-ray & ABG PRN ?- Ensure adequate pulmonary hygiene  ?Home vent settings ?Plan for d/c home today ?  ?SEVERE COPD EXACERBATION ?Much improved ?  ?Sepsis without septic shock ?    Blood cultures 04/29 aerobic bottle only>>group B strep (s.   ?    agalactiae) ?- Trend WBC and monitor fever curve  ?- Follow cultures  ?Completed ABX ?  ?  ?ENDO ?- ICU hypoglycemic\Hyperglycemia protocol ?-check FSBS per protocol ?  ?  ?GI ?GI PROPHYLAXIS as indicated ?  ?NUTRITIONAL STATUS ?DIET-->eating while on vent ?Constipation protocol as indicated ?  ?  ?ELECTROLYTES ?-follow labs as needed ?-replace as needed ?-pharmacy consultation and following ?  ?  ?Severe anxiety  ?Hx: Depression  ?- Continue outpatient antidepressants and antianxiety medications  ?- Prn valium for anxiety  ?  ?  ?  ?   ?Best Practice (right click and "Reselect all SmartList Selections" daily)  ?Diet/type: Regular ?DVT prophylaxis: LMWH ?GI prophylaxis: PPI ?Lines: N/A ?Foley:  N/A ?Code Status:  DNR ? ? ?Discharge Exam: ?Blood pressure 103/73, pulse 71, temperature 97.7 ?F (36.5 ?C), temperature source Axillary, resp. rate 16, height _0  (1.575 m), weight 80.1 kg, last menstrual period 03/06/2005, SpO2 99 %. ? ? ?Disposition:  ?ENT TO CHANGE TRACH PRIOR TO DISCHARGE ? ?Allergies as of 10/05/2021   ? ?   Reactions  ? Amoxicillin   ? Patient has tolerated cephalosporins in the past  ? Ativan [lorazepam]   ? Makes anxiety worse   ? ?  ? ?  ?Medication List  ?  ? ?TAKE these medications   ? ?Accu-Chek Guide test strip ?Generic drug: glucose blood ?4 (four) times daily. for testing ?  ?amLODipine 5 MG tablet ?Commonly known as: NORVASC ?Take 5 mg by mouth daily. ?  ?aspirin 81 MG chewable tablet ?Chew 81 mg by mouth daily. ?  ?atorvastatin 40 MG tablet ?Commonly known as: LIPITOR ?Take 40 mg by mouth at bedtime. ?  ?azelastine 0.1 % nasal spray ?Commonly known as:  ASTELIN ?SMARTSIG:1-2 Spray(s) Both Nares Every 12 Hours PRN ?  ?blood glucose meter kit and supplies Kit ?Dispense based on patient and insurance preference. Use up to four times daily as directed. (FOR ICD-9 250.00, 250.01). ?  ?clonazePAM 0.5 MG tablet ?Commonly known as: KLONOPIN ?Take 0.5 mg by mouth in the morning, at noon, and at bedtime. ?  ?cloNIDine 0.1 mg/24hr patch ?Commonly known as: CATAPRES - Dosed in mg/24 hr ?Place 0.1 mg onto the skin once a week. ?  ?fluconazole 150 MG tablet ?Commonly known as: DIFLUCAN ?Take 1 tablet day 1 repeat in 1 week ?  ?fluticasone 50 MCG/ACT nasal spray ?Commonly known as: FLONASE ?Place 1 spray into both nostrils daily. ?  ?gabapentin 100 MG capsule ?Commonly known as: NEURONTIN ?Take 100 mg by mouth 3 (three) times daily. One tablet in am and mid day, and 3 at bedtime. ?  ?guaiFENesin 600 MG 12 hr tablet ?Commonly known as:  Pearl ?Take 600 mg by mouth 2 (two) times daily. ?  ?insulin detemir 100 UNIT/ML FlexPen ?Commonly known as: LEVEMIR ?Inject 14 Units into the skin at bedtime. ?  ?ipratropium-albuterol 0.5-2.5 (3) MG/3ML

## 2021-10-05 NOTE — TOC Transition Note (Signed)
Transition of Care (TOC) - CM/SW Discharge Note ? ? ?Patient Details  ?Name: Michelle [name] ?MRN: [id number] ?Date of Birth: Jan 30, 1961 ? ?Transition of Care (TOC) CM/SW Contact:  ?Shelbie Hutching, RN ?Phone Number: ?10/05/2021, 10:03 AM ? ? ?Clinical Narrative:    ?Patient is medically cleared for discharge home today after ENT changes out her trach.  Patient tolerated home vent, patient will need EMS transport.  As soon as trach changed RNCM will arrange EMS transport home.   ? ? ?Final next level of care: [place] ?Barriers to Discharge: Continued Medical Work up (trach change) ? ? ?Patient Goals and CMS Choice ?Patient states their goals for this hospitalization and ongoing recovery are:: to get back home ?CMS Medicare.gov Compare Post Acute Care list provided to:: Patient ?Choice offered to / list presented to : Patient ? ?Discharge Placement ?  ?           ?  ?Patient to be transferred to facility by: Transport home via [facility] ?Name of family member notified: [name] ?Patient and family notified of of transfer: 10/05/21 ? ?Discharge Plan and Services ?  ?Discharge Planning Services: CM Consult ?Post Acute Care Choice: Resumption of Svcs/PTA Provider          ?DME Arranged: N/A ?DME Agency: NA ?  ?  ?  ?  ?  ?  ?  ?  ? ?Social Determinants of Health (SDOH) Interventions ?  ? ? ?Readmission Risk Interventions ? ?  10/03/2021  ?  3:58 PM 08/31/2019  ? 12:03 PM 08/07/2019  ?  2:21 PM  ?Readmission Risk Prevention Plan  ?Transportation Screening Complete Complete   ?PCP or Specialist Appt within 3-5 Days Complete    ?Social Work Consult for [place] Planning/Counseling Complete    ?Palliative Care Screening Not Applicable    ?Medication Review Press photographer) Complete Complete   ?PCP or Specialist appointment within 3-5 days of discharge  Complete Complete  ?[place] or Home Care Consult  Complete Complete  ?Palliative Care Screening  Complete Complete  ?Skilled Nursing Facility  Complete    ? ? ? ? ? ?

## 2021-10-20 ENCOUNTER — Telehealth: Payer: Self-pay | Admitting: Primary Care

## 2021-10-20 NOTE — Telephone Encounter (Signed)
T/c re concentrated urine. Instructed to drink more water and report any symptom like fever and /or dysuria. Daughter feels she's not having enough water over the day.

## 2021-10-26 ENCOUNTER — Other Ambulatory Visit: Payer: Medicaid Other | Admitting: Primary Care

## 2021-11-03 ENCOUNTER — Other Ambulatory Visit: Payer: Medicaid Other | Admitting: Primary Care

## 2021-11-03 DIAGNOSIS — Z9911 Dependence on respirator [ventilator] status: Secondary | ICD-10-CM

## 2021-11-03 DIAGNOSIS — J441 Chronic obstructive pulmonary disease with (acute) exacerbation: Secondary | ICD-10-CM

## 2021-11-03 DIAGNOSIS — Z930 Tracheostomy status: Secondary | ICD-10-CM

## 2021-11-03 DIAGNOSIS — E1165 Type 2 diabetes mellitus with hyperglycemia: Secondary | ICD-10-CM

## 2021-11-03 DIAGNOSIS — Z515 Encounter for palliative care: Secondary | ICD-10-CM

## 2021-11-03 NOTE — Progress Notes (Signed)
[facility] Palliative Care Consult Note Telephone: [phone]  Fax: [phone]    Date of encounter: 11/03/21 1:22 PM PATIENT NAME: Michelle [name] [address] [name] [address]   [phone] (home)  DOB: 1961-06-01 MRN: [id number] PRIMARY CARE PROVIDER:    Alvester Chou, NP,  [address] [place] [address] [phone]  REFERRING PROVIDER:   Alvester Chou, NP [address] [place],   [address] [phone]  RESPONSIBLE PARTY:    Contact Information     Name Relation Home Work [name], [name] Daughter [phone]  [phone]   Michelle, [name] Daughter [phone]  [phone]   Michelle, [name] Sister [phone]    Michelle [name] Significant other   [phone]   Michelle [name]   [phone]        I met face to face with patient and family in  home. Palliative Care was asked to follow this patient by consultation request of  Michelle Chou, NP to address advance care planning and complex medical decision making. This is a follow up visit.                                   ASSESSMENT AND PLAN / RECOMMENDATIONS:   Advance Care Planning/Goals of Care: Goals include to maximize quality of life and symptom management. Patient/health care surrogate gave his/her permission to discuss.Our advance care planning conversation included a discussion about:     Exploration of personal, cultural or spiritual beliefs that might influence medical decisions  Exploration of goals of care in the event of a sudden injury or illness  Identification of a healthcare agent  CODE STATUS: DNR  Symptom Management/Plan:  Anxiety: Has been having panic attacks, by description. Gets palpitations and sweats. Longstanding history.Sent in hydroxyzine 25 mg qid prn and increased paxil to 30 mg x 1 week and then 40 mg.  Sent to [facility]. States she has not done counseling and this is not an appealing idea.   Dyspnea; Has  returned to baseline, she has NIV via trach and she is breathing without labor. WE reviewed her recent hospital stay at her request.   Abdominal pain: Reviewed imaging from 09/30/21 RE some abdominal pain. Nothing was found on imaging consistent. We discussed MSK pain or gas. She will try OTC intestinal  gas treatment.  Glucose; has been high since hospital stay.  Has been in 370 in am. Denies weight gain. Current weight 160 lbs. Daughter to adjust basal insulins by 2 units to adjust to better target value. Would love her to have a libre freestyle but her insurance does not cover.   Follow up Palliative Care Visit: Palliative care will continue to follow for complex medical decision making, advance care planning, and clarification of goals. Return 8 weeks or prn.  This visit was coded based on medical decision making (MDM).  PPS: 40%  HOSPICE ELIGIBILITY/DIAGNOSIS: TBD  Chief Complaint: debility, GAD  HISTORY OF PRESENT ILLNESS:  Michelle [name] is a 61 y.o. 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 Has been to hospital for acute on chronic COPD; this is f/u from that episode.  . Patient seen today to review palliative care needs to include medical decision making and advance care planning as appropriate.   History obtained from review of EMR, discussion with primary team, and interview with family, facility  staff/caregiver and/or Michelle [name].  I reviewed available labs, medications, imaging, studies and related documents from the EMR.  Records reviewed and summarized above.   ROS  General: NAD ENMT: denies dysphagia Cardiovascular: denies chest pain, denies DOE Pulmonary: denies cough, denies increased SOB Abdomen: endorses good appetite, denies constipation, endorses continence of bowel GU: denies dysuria, endorses continence of urine MSK:  denies  increased weakness,  no falls reported Skin: denies rashes or wounds Neurological: denies pain,  denies insomnia Psych: Endorses positive mood with occ panic attacks.  Physical Exam: Current and past weights: 160 lbs Constitutional: NAD General: frail appearing, WNWD EYES: anicteric sclera, lids intact, no discharge  ENMT: intact hearing, oral mucous membranes moist CV: S1S2, RRR, no LE edema Pulmonary: very diminished, uses nebs prn, no increased work of breathing, no cough, Oxygen thru trach via NIV. Abdomen: intake 100%, normo-active BS + 4 quadrants, soft and non tender, no ascites MSK: no sarcopenia, moves all extremities,  non ambulatory Skin: warm and dry, no rashes or wounds on visible skin Neuro:  no new  generalized weakness,  no cognitive impairment, non-anxious affect   Thank you for the opportunity to participate in the care of Michelle [name].  The palliative care team will continue to follow. Please call our office at [phone] if we can be of additional assistance.   Michelle Coop, NP DNP, AGPCNP-BC  COVID-19 PATIENT SCREENING TOOL Asked and negative response unless otherwise noted:   Have you had symptoms of covid, tested positive or been in contact with someone with symptoms/positive test in the past 5-10 days?

## 2021-11-06 ENCOUNTER — Other Ambulatory Visit: Payer: Medicaid Other | Admitting: Primary Care

## 2021-11-09 ENCOUNTER — Other Ambulatory Visit: Payer: Medicaid Other | Admitting: Primary Care

## 2021-12-26 ENCOUNTER — Other Ambulatory Visit: Payer: Medicaid Other | Admitting: Primary Care

## 2021-12-26 DIAGNOSIS — R6883 Chills (without fever): Secondary | ICD-10-CM

## 2021-12-26 DIAGNOSIS — F418 Other specified anxiety disorders: Secondary | ICD-10-CM

## 2021-12-26 DIAGNOSIS — Z930 Tracheostomy status: Secondary | ICD-10-CM

## 2021-12-26 DIAGNOSIS — J9610 Chronic respiratory failure, unspecified whether with hypoxia or hypercapnia: Secondary | ICD-10-CM

## 2021-12-26 DIAGNOSIS — R0602 Shortness of breath: Secondary | ICD-10-CM

## 2021-12-26 NOTE — Progress Notes (Signed)
[facility] Pall[facility] Consult Note Telephone: [phone]  Fax: [phone]    Date of encounter: 12/26/21 10:36 AM PATIENT NAME: Michelle [name] [address] [name] [address]   [phone] (home)  DOB: 21-Oct-1960 MRN: [id number] PRIMARY CARE PROVIDER:    Alvester Chou, NP,  [address] [place] [address] [phone]  REFERRING PROVIDER:   Alvester Chou, NP [address] [place],   [address] [phone]  RESPONSIBLE PARTY:    Contact Information     Name Relation Home Work [place], [name] Daughter [phone]  [phone]   Magenta, [place] Daughter [phone]  [phone]   Shawnita, [place] Sister [phone]    [name] Significant other   [phone]   [name]   [phone]        I met face to face with patient and family in  home. Palliative Care was asked to follow this patient by consultation request of  Alvester Chou, NP to address advance care planning and complex medical decision making. This is a follow up visit.                                   ASSESSMENT AND PLAN / RECOMMENDATIONS:   Advance Care Planning/Goals of Care: Goals include to maximize quality of life and symptom management. Patient/health care surrogate gave his/her permission to discuss.Our advance care planning conversation included a discussion about:    The value and importance of advance care planning  Exploration of personal, cultural or spiritual beliefs that might influence medical decisions  Exploration of goals of care in the event of a sudden injury or illness  Has questions about EOL process with NIV, how she would be weaned and dying process. She is however improving in her disease process, but voices being tired of living with trach and NIV. She will discuss with pulmonology to see if she has any options to wean. Identification of a healthcare agent - [name] Review of an  advance  directive document .- no changes CODE STATUS: DNR Reviewed MOST, no changes.   Symptom Management/Plan:  Disease process:  Voices wanting to know how she may pass one day, but that she feels better. We discussed EOL process with ventilator. She also discusses being interested in weaning off NIV if she could during the day for more mobility. She is much healthier than when she started NIV and would like to be more active. Discussed getting PT referral in early sept. After she discusses with Pulmonary and RT, and weather is cooler.  Dyspnea: Has had some bronchospasms, according to description, esp. With changing temperatures ( going from very cool to outside). Daughter started prednisone, but I feel it's not exacerbation currently.  She will d/c po prednisone. Her anxiety sounds more like panic when she cannot get a breath, likely due to poor air quality this time of year. Review use of nebulizer. Also encouraged to get portable neb unit for visiting family as she cannot use conventiona l HFA. She did say that during a tube change she experienced decreased NIV support and it startled her. We discussed if she did have weaning options it would be controlled and she would know what to expect.   Trach: Has questions about weaning, can discuss with RT and MD.   Mood: Improved depression with 40 mg paxil, increased from 20 mg. She appears more interactive  and brighter, and endorses same. She endorses feeling better and wanting to try to become more active.   Chills: has reported chills then hot flashes, no other sx. Sugars are 160-200, with prednisone has increased.Appears to be post menopausal, although she states she passed menopause some time ago.  DM: Glucose more under control . Last A1C = 11%, due to recheck in a month or 2 due to increase in insulin, per PCP.  Follow up Palliative Care Visit: Palliative care will continue to follow for complex medical decision making, advance care planning, and  clarification of goals. Return 6 weeks or prn.  I spent 60 minutes providing this consultation. More than 50% of the time in this consultation was spent in counseling and care coordination.  PPS: 40%  HOSPICE ELIGIBILITY/DIAGNOSIS: TBD  Chief Complaint: dyspnea, chills  HISTORY OF PRESENT ILLNESS:  Michelle [name] is a 61 y.o. year old female  with COPD, ventilation, depression, which has improved. Patient is interested in future choices with ventilation therapy . Patient seen today to review palliative care needs to include medical decision making and advance care planning as appropriate.   History obtained from review of EMR, discussion with primary team, and interview with family, facility staff/caregiver and/or Michelle [name].  I reviewed available labs, medications, imaging, studies and related documents from the EMR.  Records reviewed and summarized above.   ROS  General: NAD ENMT: denies dysphagia Cardiovascular: denies chest pain, denies DOE Pulmonary: denies cough, denies increased SOB, trach with NIV Abdomen: endorses good appetite, denies constipation, endorses continence of bowel GU: denies dysuria, endorses continence of urine MSK:  denies  increased weakness,  no falls reported Skin: denies rashes or wounds Neurological: denies pain, denies insomnia Psych: Endorses positive mood, endorses improved mood but occ panic esp with dyspnea  Physical Exam: Current and past weights:unavailable. Constitutional: NAD General: frail appearing, wnwd EYES: anicteric sclera, lids intact, no discharge  ENMT: intact hearing, oral mucous membranes moist CV: S1S2, RRR, no LE edema Pulmonary:  moving air all fields, scattered rhonchi,  no increased work of breathing, no cough, Trach with supplemental  oxygen  and NIV. Abdomen: intake 100%, soft and non tender, no ascites MSK: no sarcopenia, moves all extremities, ambulatory few steps, uses w/c Skin: warm and dry, no rashes or wounds on  visible skin Neuro:  no generalized weakness,  no cognitive impairment, non-anxious affect   Thank you for the opportunity to participate in the care of Michelle [name].  The palliative care team will continue to follow. Please call our office at [phone] if we can be of additional assistance.   Jason Coop, NP DNP, AGPCNP-BC  COVID-19 PATIENT SCREENING TOOL Asked and negative response unless otherwise noted:   Have you had symptoms of covid, tested positive or been in contact with someone with symptoms/positive test in the past 5-10 days?

## 2021-12-28 ENCOUNTER — Other Ambulatory Visit: Payer: Medicaid Other | Admitting: Primary Care

## 2022-01-02 ENCOUNTER — Ambulatory Visit: Payer: Medicaid Other | Admitting: Adult Health

## 2022-02-20 ENCOUNTER — Other Ambulatory Visit: Payer: Medicaid Other | Admitting: Primary Care

## 2022-02-27 ENCOUNTER — Other Ambulatory Visit: Payer: Medicaid Other | Admitting: Primary Care

## 2022-02-27 DIAGNOSIS — J9610 Chronic respiratory failure, unspecified whether with hypoxia or hypercapnia: Secondary | ICD-10-CM

## 2022-02-27 DIAGNOSIS — Z930 Tracheostomy status: Secondary | ICD-10-CM

## 2022-02-27 DIAGNOSIS — Z515 Encounter for palliative care: Secondary | ICD-10-CM

## 2022-02-27 DIAGNOSIS — F418 Other specified anxiety disorders: Secondary | ICD-10-CM

## 2022-02-27 NOTE — Progress Notes (Signed)
[facility] Consult Note Telephone: [phone]  Fax: [phone]    Date of encounter: 02/27/22 9:46 AM PATIENT NAME: Michelle [name] [address] [name] [address]   [phone] (home)  DOB: 1961-01-06 MRN: [id number] PRIMARY CARE PROVIDER:    Alvester Chou, NP,  [address] [place] [address] [phone]  REFERRING PROVIDER:   Alvester Chou, NP [address] [place],   [address] [phone]  RESPONSIBLE PARTY:    Contact Information     Name Relation Home Work [place], [name] Daughter [phone]  [phone]   Adrielle, [place] Daughter [phone]  [phone]   Bambi, [place] Sister [phone]    [name] Significant other   [phone]   [name]   [phone]       I met face to face with patient and family in  home. Palliative Care was asked to follow this patient by consultation request of  Alvester Chou, NP to address advance care planning and complex medical decision making. This is a follow up visit.  ASSESSMENT AND PLAN / RECOMMENDATIONS:    Advance Care Planning/Goals of Care: Goals include to maximize quality of life and symptom management. Patient/health care surrogate gave his/her permission to discuss.Our advance care planning conversation included a discussion about:     Exploration of personal, cultural or spiritual beliefs that might influence medical decisions  Exploration of goals of care in the event of a sudden injury or illness - prefers no hospital Identification of a healthcare agent - daughter CODE STATUS: DNR  Symptom Management/Plan:  Discussed upcoming d/c of home program. Notice given verbally Discussed immunizations, flu, rsv and covid. States mood is improved on increase of SSRI. No more chills RT- going to pulmonary, has humidity on her NIV, no more spasm reported Glucose: Improved from 11 % to 8% , encouraged to decrease  more. 160-180 mg / dl on in home finger stick.  Follow up Palliative Care Visit: Palliative care will  d/c home program, notice given.  This visit was coded based on medical decision making (MDM).  PPS: 40%  HOSPICE ELIGIBILITY/DIAGNOSIS: TBD  Chief Complaint: dyspnea  HISTORY OF PRESENT ILLNESS:  Michelle [name] is a 61 y.o. year old female  with trach  and vent dependence, depression. . Patient seen today to review palliative care needs to include medical decision making and advance care planning as appropriate.   History obtained from review of EMR, discussion with primary team, and interview with family, facility staff/caregiver and/or Ms. Tripathi.  I reviewed available labs, medications, imaging, studies and related documents from the EMR.  Records reviewed and summarized above.   ROS   General: NAD EYES: denies vision changes ENMT: denies dysphagia Cardiovascular: denies chest pain, denies DOE Pulmonary: denies cough, denies increased SOB Abdomen: endorses good appetite, denies constipation, endorses continence of bowel GU: denies dysuria, endorses continence of urine MSK:  denies  increased weakness,  no falls reported Skin: denies rashes or wounds Neurological: denies pain, denies insomnia Psych: Endorses positive mood  Physical Exam: Current and past weights: unavailable  Constitutional: NAD General: frail appearing EYES: anicteric sclera, lids intact, no discharge  ENMT: intact hearing, oral mucous membranes moist Pulmonary no increased work of breathing, no cough, oxygenated NIV via trach Abdomen: intake 100%, soft and non tender, no ascites MSK: no sarcopenia, moves all extremities,  non ambulatory Skin: warm and dry, no rashes or wounds on visible skin Neuro:  no generalized weakness,  no cognitive impairment, non-anxious affect  Thank you for the opportunity to participate in the care of [name].  The palliative care team will continue to follow. Please call  our office at [phone] if we can be of additional assistance.   Jason Coop, NP DNP, AGPCNP-BC  COVID-19 PATIENT SCREENING TOOL Asked and negative response unless otherwise noted:   Have you had symptoms of covid, tested positive or been in contact with someone with symptoms/positive test in the past 5-10 days?

## 2022-04-02 ENCOUNTER — Ambulatory Visit
Admission: RE | Admit: 2022-04-02 | Discharge: 2022-04-02 | Disposition: A | Discharge status: Home / Self Care | Payer: Medicaid Other | Source: Ambulatory Visit | Attending: Adult Health | Admitting: Adult Health

## 2022-04-02 ENCOUNTER — Encounter: Payer: Self-pay | Admitting: Adult Health

## 2022-04-02 ENCOUNTER — Ambulatory Visit: Payer: Medicaid Other | Admitting: Adult Health

## 2022-04-02 VITALS — BP 120/84 | HR 77

## 2022-04-02 DIAGNOSIS — J441 Chronic obstructive pulmonary disease with (acute) exacerbation: Secondary | ICD-10-CM | POA: Diagnosis not present

## 2022-04-02 DIAGNOSIS — Z9911 Dependence on respirator [ventilator] status: Secondary | ICD-10-CM

## 2022-04-02 DIAGNOSIS — J449 Chronic obstructive pulmonary disease, unspecified: Secondary | ICD-10-CM

## 2022-04-02 DIAGNOSIS — R0602 Shortness of breath: Secondary | ICD-10-CM

## 2022-04-02 DIAGNOSIS — J9610 Chronic respiratory failure, unspecified whether with hypoxia or hypercapnia: Secondary | ICD-10-CM | POA: Diagnosis not present

## 2022-04-02 DIAGNOSIS — I5032 Chronic diastolic (congestive) heart failure: Secondary | ICD-10-CM

## 2022-04-02 DIAGNOSIS — Z930 Tracheostomy status: Secondary | ICD-10-CM

## 2022-04-02 MED ORDER — ALBUTEROL SULFATE HFA 108 (90 BASE) MCG/ACT IN AERS: 2.0000 | INHALATION_SPRAY | Freq: Four times a day (QID) | RESPIRATORY_TRACT | 2 refills | Status: AC | PRN

## 2022-04-02 MED ORDER — DOXYCYCLINE HYCLATE 100 MG PO TABS: 100.0000 mg | ORAL_TABLET | Freq: Two times a day (BID) | ORAL | 0 refills | Status: DC

## 2022-04-02 NOTE — Assessment & Plan Note (Signed)
Acute exacerbation.  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  On chronic steroids.  We will check chest x-ray today.  Treat with empiric antibiotics. Continue on aggressive pulmonary maintenance regimen with DuoNeb and Pulmicort.  Plan  Patient Instructions  Doxcycline '100mg'$  Twice daily  for 1 week  Continue on Pulmicort Neb Twice daily  Continue on Duoneb Four times a day  .  Continue on Vent all the time.  Continue with Trach care .  Activity as tolerated. Up to chair daily as able .  Continue on Oxygen 5l/m  Chest xray today  Continue on Prednisone '10mg'$  daily.  Continue with Palliative care  Follow up with Dr. Mortimer Fries in 3-4 months and As needed   Please contact office for sooner follow up if symptoms do not improve or worsen or seek emergency care

## 2022-04-02 NOTE — Progress Notes (Signed)
_0  ID: Michelle [name], female    DOB: 21-Aug-1960, 61 y.o.   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 Followed by palliative care at home. History of atypical pneumonia with Pseudomonas and MRSA  History of pneumothorax. Medical history significant for polysubstance abuse with cocaine, congestive heart failure, hep C and diabetes  TEST/EVENTS :  home vent settings  Vent Mode: PRVC FiO2 (%): 40 % S RR: 16 S VT: 450 mL PEEP: 5 cm H20   04/02/2022 Follow up : COPD , O2RF , chronic trach on  vent  Patient returns for a follow-up visit last seen August 17, 2021.  Patient has underlying very severe COPD.  This is complicated by chronic hypoxic and hypercarbic respiratory failure.  Patient has a chronic trach and nocturnal vent.  She remains on oxygen 5 L..  She uses Pulmicort nebulizer twice daily and DuoNeb 4 times daily.  Since last visit patient says she has had increased cough over the last week.  Has had some chills and low-grade fever for the last couple days.  She denies any hemoptysis, chest pain, orthopnea, increased edema.  She remains on prednisone 10 mg daily. She follows with ENT for trach changes.  Daughter helps her at home.  She is mainly in the wheelchair.  Has a nurse daily for 8 hours.  That helps with her bathing and care.  She is not on any tube feeds any longer. Was admitted late April 2023 for COPD exacerbation. No other admissions since then .  Complains of thick mucus that is yellow. Worried that it will turn into something more serious .  No fever or hemoptysis .  Palliative care follows at home.    Allergies  Allergen Reactions   Amoxicillin     Patient has tolerated cephalosporins in the past   Ativan [Lorazepam]     Makes  anxiety worse     Immunization History  Administered Date(s) Administered   Influenza,inj,Quad PF,6+ Mos 03/15/2017, 06/23/2020   Influenza-Unspecified 05/04/2016, 03/06/2019   PFIZER(Purple Top)SARS-COV-2 Vaccination 02/17/2020, 03/08/2020   Pneumococcal Conjugate-13 12/22/2017   Tdap 07/18/2016    Past Medical History:  Diagnosis Date   Allergy    Anxiety    Asthma    CHF (congestive heart failure) (HCC)    Cocaine abuse (HCC)    COPD (chronic obstructive pulmonary disease) (HCC)    Coronary artery disease    Emphysema/COPD (HCC)    Hepatitis C    High cholesterol    Hypertension    Pneumothorax    Tobacco abuse     Tobacco History: Social History   Tobacco Use  Smoking Status Former   Packs/day: 0.10   Years: 41.00   Total pack years: 4.10   Types: Cigarettes   Quit date: 06/09/2017   Years since quitting: 4.8  Smokeless Tobacco Never   Counseling given: Not Answered   Outpatient Medications Prior to Visit  Medication Sig Dispense Refill   ACCU-CHEK GUIDE test strip 4 (four) times daily. for testing     amLODipine (NORVASC) 5 MG tablet Take 5 mg by mouth daily.     aspirin 81 MG chewable tablet Chew 81 mg by mouth daily.      azelastine (ASTELIN) 0.1 % nasal spray SMARTSIG:1-2 Spray(s) Both Nares Every  12 Hours PRN     blood glucose meter kit and supplies KIT Dispense based on patient and insurance preference. Use up to four times daily as directed. (FOR ICD-9 250.00, 250.01). 1 each 0   clonazePAM (KLONOPIN) 0.5 MG tablet Take 0.5 mg by mouth in the morning, at noon, and at bedtime.     cloNIDine (CATAPRES - DOSED IN MG/24 HR) 0.1 mg/24hr patch Place 0.1 mg onto the skin once a week.     fluticasone (FLONASE) 50 MCG/ACT nasal spray Place 1 spray into both nostrils daily.      guaiFENesin (MUCINEX) 600 MG 12 hr tablet Take 600 mg by mouth 2 (two) times daily.     hydrOXYzine (ATARAX) 25 MG tablet Take 25 mg by mouth every 6 (six) hours as needed.     insulin  detemir (LEVEMIR) 100 UNIT/ML FlexPen Inject 14 Units into the skin at bedtime.     Insulin Pen Needle (PEN NEEDLES) 32G X 6 MM MISC 1 each by Does not apply route 4 (four) times daily - after meals and at bedtime. 100 each 0   ipratropium-albuterol (DUONEB) 0.5-2.5 (3) MG/3ML SOLN Take 3 mLs by nebulization every 6 (six) hours. (Patient taking differently: Take 3 mLs by nebulization every 6 (six) hours as needed (for shortness of breath).) 360 mL 3   mirtazapine (REMERON) 30 MG tablet Take 30 mg by mouth at bedtime.     mupirocin ointment (BACTROBAN) 2 % Apply 1 application topically daily. To inside nose and to affected area of axilla 22 g 1   NOVOLOG FLEXPEN 100 UNIT/ML FlexPen Inject into the skin 3 (three) times daily with meals. Sliding scale mealtime insulin.     oxyCODONE-acetaminophen (PERCOCET/ROXICET) 5-325 MG tablet Take 1 tablet by mouth 3 (three) times daily as needed for pain.     PARoxetine (PAXIL) 40 MG tablet Take 40 mg by mouth every morning. Give 30 mg x 1 week (1.5 tab of 20 mg) then start 40 mg daily. D/c 20 mg pills at that time.     PULMICORT 0.5 MG/2ML nebulizer solution SMARTSIG:1 Via Nebulizer Twice Daily     terbinafine (LAMISIL) 1 % cream Apply 1 application topically 2 (two) times daily. To affected skin, for 1 week after resolution of rash. # 30 gm, 1 refill.     TRULICITY 4.5 VP/7.1GG SOPN Inject 4.5 mg into the skin once a week.     Vitamin D, Ergocalciferol, (DRISDOL) 1.25 MG (50000 UNIT) CAPS capsule Take 50,000 Units by mouth 2 (two) times a week.     atorvastatin (LIPITOR) 40 MG tablet Take 40 mg by mouth at bedtime. (Patient not taking: Reported on 09/30/2021)     cetirizine (ZYRTEC) 10 MG tablet Take 10 mg by mouth daily.     Ferrous Sulfate (IRON) 325 (65 Fe) MG TABS Take 1 tablet by mouth every other day. (Patient not taking: Reported on 09/30/2021)     fluconazole (DIFLUCAN) 150 MG tablet Take 1 tablet day 1 repeat in 1 week (Patient not taking: Reported on  09/30/2021) 2 tablet 0   gabapentin (NEURONTIN) 100 MG capsule Take 100 mg by mouth 3 (three) times daily. One tablet in am and mid day, and 3 at bedtime. (Patient not taking: Reported on 09/30/2021)     polyethylene glycol powder (GLYCOLAX/MIRALAX) 17 GM/SCOOP powder Take by mouth. (Patient not taking: Reported on 04/02/2022)     predniSONE (DELTASONE) 10 MG tablet Take 10 mg by mouth daily.  promethazine (PHENERGAN) 12.5 MG tablet Take by mouth. (Patient not taking: Reported on 04/02/2022)     No facility-administered medications prior to visit.     Review of Systems:   Constitutional:   No  weight loss, night sweats,  Fevers, chills,  +fatigue, or  lassitude.  HEENT:   No headaches,  Difficulty swallowing,  Tooth/dental problems, or  Sore throat,                No sneezing, itching, ear ache, nasal congestion, post nasal drip,   CV:  No chest pain,  Orthopnea, PND, swelling in lower extremities, anasarca, dizziness, palpitations, syncope.   GI  No heartburn, indigestion, abdominal pain, nausea, vomiting, diarrhea, change in bowel habits, loss of appetite, bloody stools.   Resp: .  No chest wall deformity  Skin: no rash or lesions.  GU: no dysuria, change in color of urine, no urgency or frequency.  No flank pain, no hematuria   MS:  No joint pain or swelling.  No decreased range of motion.  No back pain.    Physical Exam  BP 120/84 (BP Location: Left Arm, Cuff Size: Normal)   Pulse 77   LMP 03/06/2005 (Approximate)   SpO2 100%   GEN: A/Ox3; pleasant , NAD, well nourished , On O2 and Vent    HEENT:  [place]/AT,  NOSE-clear, THROAT-clear, no lesions, no postnasal drip or exudate noted.   NECK:  Supple w/ fair ROM; no JVD; normal carotid impulses w/o bruits;  Trach is midline , dressing clean and dry   RESP  Clear  P & A; w/o, wheezes/ rales/ or rhonchi. no accessory muscle use, no dullness to percussion On Vent   CARD:  RRR, no m/r/g, no peripheral edema, pulses intact,  no cyanosis or clubbing.  GI:   Soft & nt; nml bowel sounds; no organomegaly or masses detected.   Musco: Warm bil, no deformities or joint swelling noted.   Neuro: alert, no focal deficits noted.    Skin: Warm, no lesions or rashes    Lab Results:  CBC   BMET     ProBNP No results found for: "PROBNP"  Imaging: No results found.        No data to display          No results found for: "NITRICOXIDE"      Assessment & Plan:   COPD exacerbation ([place]) Acute exacerbation.  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  On chronic steroids.  We will check chest x-ray today.  Treat with empiric antibiotics. Continue on aggressive pulmonary maintenance regimen with DuoNeb and Pulmicort.  Plan  Patient Instructions  Doxcycline 127m Twice daily  for 1 week  Continue on Pulmicort Neb Twice daily  Continue on Duoneb Four times a day  .  Continue on Vent all the time.  Continue with Trach care .  Activity as tolerated. Up to chair daily as able .  Continue on Oxygen 5l/m  Chest xray today  Continue on Prednisone 160mdaily.  Continue with Palliative care  Follow up with Dr. KaMortimer Friesn 3-4 months and As needed   Please contact office for sooner follow up if symptoms do not improve or worsen or seek emergency care      Chronic respiratory failure requiring continuous mechanical ventilation through tracheostomy (HCChrisneyAppears compensated on oxygen and continuous vent support.  Plan  Patient Instructions  Doxcycline 10043mwice daily  for 1 week  Continue on  Pulmicort Neb Twice daily  Continue on Duoneb Four times a day  .  Continue on Vent all the time.  Continue with Trach care .  Activity as tolerated. Up to chair daily as able .  Continue on Oxygen 5l/m  Chest xray today  Continue on Prednisone 83m daily.  Continue with Palliative care  Follow up with Dr. KMortimer Friesin 3-4 months and As needed   Please contact office for sooner  follow up if symptoms do not improve or worsen or seek emergency care      Chronic diastolic CHF (congestive heart failure) ([place] Appears euvolemic on exam.  Continue current regimen  Tracheostomy status ([place] Continue with daily trach care.  Continue follow-up with ENT     TRexene Edison NP 04/02/2022

## 2022-04-02 NOTE — Assessment & Plan Note (Signed)
Continue with daily trach care.  Continue follow-up with ENT

## 2022-04-02 NOTE — Assessment & Plan Note (Signed)
Appears compensated on oxygen and continuous vent support.  Plan  Patient Instructions  Doxcycline '100mg'$  Twice daily  for 1 week  Continue on Pulmicort Neb Twice daily  Continue on Duoneb Four times a day  .  Continue on Vent all the time.  Continue with Trach care .  Activity as tolerated. Up to chair daily as able .  Continue on Oxygen 5l/m  Chest xray today  Continue on Prednisone '10mg'$  daily.  Continue with Palliative care  Follow up with Dr. Mortimer Fries in 3-4 months and As needed   Please contact office for sooner follow up if symptoms do not improve or worsen or seek emergency care

## 2022-04-02 NOTE — Patient Instructions (Addendum)
Doxcycline '100mg'$  Twice daily  for 1 week  Continue on Pulmicort Neb Twice daily  Continue on Duoneb Four times a day  .  Continue on Vent all the time.  Continue with Trach care .  Activity as tolerated. Up to chair daily as able .  Continue on Oxygen 5l/m  Chest xray today  Continue on Prednisone '10mg'$  daily.  Continue with Palliative care  Follow up with Dr. Mortimer Fries in 3-4 months and As needed   Please contact office for sooner follow up if symptoms do not improve or worsen or seek emergency care

## 2022-04-02 NOTE — Assessment & Plan Note (Signed)
Appears euvolemic on exam.  Continue current regimen

## 2022-04-06 ENCOUNTER — Telehealth: Payer: Self-pay | Admitting: Adult Health

## 2022-04-06 NOTE — Telephone Encounter (Signed)
Called and spoke to daughter about chest xray results. She verbalized understanding. Nothing further needed

## 2022-04-06 NOTE — Progress Notes (Signed)
ATC x1.  LVM to return call. 

## 2022-04-09 NOTE — Progress Notes (Signed)
Called and spoke with Elizabeth Sauer (Daughter), patient lives with her daughter and she is her caregiver.  Daughter brings her to all of her appointments.  She is listed under her contacts.  Provided results/recommendations per Rexene Edison NP.  Nothing further needed.

## 2022-05-21 ENCOUNTER — Encounter: Payer: Self-pay | Admitting: Dermatology

## 2022-05-21 ENCOUNTER — Ambulatory Visit: Payer: Medicaid Other | Admitting: Dermatology

## 2022-05-21 DIAGNOSIS — L02411 Cutaneous abscess of right axilla: Secondary | ICD-10-CM | POA: Diagnosis not present

## 2022-05-21 DIAGNOSIS — L089 Local infection of the skin and subcutaneous tissue, unspecified: Secondary | ICD-10-CM

## 2022-05-21 DIAGNOSIS — L0291 Cutaneous abscess, unspecified: Secondary | ICD-10-CM

## 2022-05-21 MED ORDER — DOXYCYCLINE MONOHYDRATE 100 MG PO CAPS: 100.0000 mg | ORAL_CAPSULE | Freq: Two times a day (BID) | ORAL | 0 refills | Status: AC

## 2022-05-21 MED ORDER — MUPIROCIN 2 % EX OINT: 1.0000 | TOPICAL_OINTMENT | Freq: Every day | CUTANEOUS | 1 refills | Status: DC

## 2022-05-21 NOTE — Progress Notes (Signed)
   Follow-Up Visit   Subjective  Michelle [name] is a 61 y.o. female who presents for the following: abscess (Patient advises she has boils under both arms, painful but not draining. ).  Patient accompanied by caregiver.   The following portions of the chart were reviewed this encounter and updated as appropriate:   Tobacco  Allergies  Meds  Problems  Med Hx  Surg Hx  Fam Hx      Review of Systems:  No other skin or systemic complaints except as noted in HPI or Assessment and Plan.  Objective  Well appearing patient in no apparent distress; mood and affect are within normal limits.  A focused examination was performed including bilateral axilla. Relevant physical exam findings are noted in the Assessment and Plan.  Right Axilla 4 tender subcutaneous papules at left axilla 1 tender subcutaneous papule at right axilla, 1 tender subcutaneous nodule with central fluctuance at right axilla  Left Axilla 4 tender subcutaneous papules at left axilla 1 tender subcutaneous papule at right axilla, 1 tender subcutaneous nodule with central fluctuance at right axilla    Assessment & Plan  Abscess Right Axilla    doxycycline (MONODOX) 100 MG capsule - Right Axilla Take 1 capsule (100 mg total) by mouth 2 (two) times daily for 10 days. Take with food  Incision and Drainage - Right Axilla Location: right axilla  Informed Consent: Discussed risks (permanent scarring, light or dark discoloration, infection, pain, bleeding, bruising, redness, damage to adjacent structures, and recurrence of the lesion) and benefits of the procedure, as well as the alternatives.  Informed consent was obtained.  Preparation: The area was prepped with alcohol.  Anesthesia: Lidocaine 1% with epinephrine  Procedure Details: An incision was made overlying the lesion. The lesion drained pus and clear fluid.  A small amount of fluid was drained.    Antibiotic ointment and a sterile pressure dressing were  applied. The patient tolerated procedure well.  Total number of lesions drained: 1  Plan: The patient was instructed on post-op care. Recommend OTC analgesia as needed for pain.   Related Procedures Anaerobic and Aerobic Culture Anaerobic and Aerobic Culture  Related Medications mupirocin ointment (BACTROBAN) 2 % Apply 1 Application topically daily. 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 Start Hibaclens daily to wash. Samples given.   Doxycycline should be taken with food to prevent nausea. Do not lay down for 30 minutes after taking. Be cautious with sun exposure and use good sun protection while on this medication. Pregnant women should not take this medication.   Culture today of I&D site and nares Plan mupirocin to nares if MRSA in nares   Return if symptoms worsen or fail to improve.  Graciella Belton, RMA, am acting as scribe for Forest Gleason, MD .  Documentation: I have reviewed the above documentation for accuracy and completeness, and I agree with the above.  Forest Gleason, MD

## 2022-05-21 NOTE — Patient Instructions (Addendum)
Start doxycycline monohydrate 100 mg twice daily with food x 10 days. Start Hibaclens daily to wash. Samples given.   Doxycycline should be taken with food to prevent nausea. Do not lay down for 30 minutes after taking. Be cautious with sun exposure and use good sun protection while on this medication. Pregnant women should not take this medication.    Due to recent changes in healthcare laws, you may see results of your pathology and/or laboratory studies on MyChart before the doctors have had a chance to review them. We understand that in some cases there may be results that are confusing or concerning to you. Please understand that not all results are received at the same time and often the doctors may need to interpret multiple results in order to provide you with the best plan of care or course of treatment. Therefore, we ask that you please give [place] 2 business days to thoroughly review all your results before contacting the office for clarification. Should we see a critical lab result, you will be contacted sooner.   If You Need Anything After Your Visit  If you have any questions or concerns for your doctor, please call our main line at [phone] and press option 4 to reach your doctor's medical assistant. If no one answers, please leave a voicemail as directed and we will return your call as soon as possible. Messages left after 4 pm will be answered the following business day.   You may also send [place] a message via [place]. We typically respond to MyChart messages within 1-2 business days.  For prescription refills, please ask your pharmacy to contact our office. Our fax number is [phone].  If you have an urgent issue when the clinic is closed that cannot wait until the next business day, you can page your doctor at the number below.    Please note that while we do our best to be available for urgent issues outside of office hours, we are not available 24/7.   If you have an urgent  issue and are unable to reach [place], you may choose to seek medical care at your doctor's office, retail clinic, urgent care center, or emergency room.  If you have a medical emergency, please immediately call 911 or go to the emergency department.  Pager Numbers  - Dr. Nehemiah Massed: [phone]  - Dr. Laurence Ferrari: [phone]  - Dr. Nicole Kindred: [phone]  In the event of inclement weather, please call our main line at [phone] for an update on the status of any delays or closures.  Dermatology Medication Tips: Please keep the boxes that topical medications come in in order to help keep track of the instructions about where and how to use these. Pharmacies typically print the medication instructions only on the boxes and not directly on the medication tubes.   If your medication is too expensive, please contact our office at [phone] option 4 or send [place] a message through [place].   We are unable to tell what your co-pay for medications will be in advance as this is different depending on your insurance coverage. However, we may be able to find a substitute medication at lower cost or fill out paperwork to get insurance to cover a needed medication.   If a prior authorization is required to get your medication covered by your insurance company, please allow [place] 1-2 business days to complete this process.  Drug prices often vary depending on where the prescription is filled and some pharmacies may offer  website www.goodrx.com contains coupons for medications through different pharmacies. The prices here do not account for what the cost may be with help from insurance (it may be cheaper with your insurance), but the website can give you the price if you did not use any insurance.  - You can print the associated coupon and take it with your prescription to the pharmacy.  - You may also stop by our office during regular business hours and pick up a GoodRx coupon card.  - If you need your  prescription sent electronically to a different pharmacy, notify our office through [place] MyChart or by phone at [phone] option 4.     Si Usted Necesita Algo Despus de Su Visita  Tambin puede enviarnos un mensaje a travs de MyChart. Por lo general respondemos a los mensajes de MyChart en el transcurso de 1 a 2 das hbiles.  Para renovar recetas, por favor pida a su farmacia que se ponga en contacto con nuestra oficina. Nuestro nmero de fax es el [phone].  Si tiene un asunto urgente cuando la clnica est cerrada y que no puede esperar hasta el siguiente da hbil, puede llamar/localizar a su doctor(a) al nmero que aparece a continuacin.   Por favor, tenga en cuenta que aunque hacemos todo lo posible para estar disponibles para asuntos urgentes fuera del horario de oficina, no estamos disponibles las 24 horas del da, los 7 das de la semana.   Si tiene un problema urgente y no puede comunicarse con nosotros, puede optar por buscar atencin mdica  en el consultorio de su doctor(a), en una clnica privada, en un centro de atencin urgente o en una sala de emergencias.  Si tiene una emergencia mdica, por favor llame inmediatamente al 911 o vaya a la sala de emergencias.  Nmeros de bper  - Dr. Kowalski: [phone]  - Dra. Moye: [phone]  - Dra. Stewart: [phone]  En caso de inclemencias del tiempo, por favor llame a nuestra lnea principal al [phone] para una actualizacin sobre el estado de cualquier retraso o cierre.  Consejos para la medicacin en dermatologa: Por favor, guarde las cajas en las que vienen los medicamentos de uso tpico para ayudarle a seguir las instrucciones sobre dnde y cmo usarlos. Las farmacias generalmente imprimen las instrucciones del medicamento slo en las cajas y no directamente en los tubos del medicamento.   Si su medicamento es muy caro, por favor, pngase en contacto con nuestra oficina llamando al [phone]  y presione la opcin 4 o envenos un mensaje a travs de MyChart.   No podemos decirle cul ser su copago por los medicamentos por adelantado ya que esto es diferente dependiendo de la cobertura de su seguro. Sin embargo, es posible que podamos encontrar un medicamento sustituto a menor costo o llenar un formulario para que el seguro cubra el medicamento que se considera necesario.   Si se requiere una autorizacin previa para que su compaa de seguros cubra su medicamento, por favor permtanos de 1 a 2 das hbiles para completar este proceso.  Los precios de los medicamentos varan con frecuencia dependiendo del lugar de dnde se surte la receta y alguna farmacias pueden ofrecer precios ms baratos.  El sitio web www.goodrx.com tiene cupones para medicamentos de diferentes farmacias. Los precios aqu no tienen en cuenta lo que podra costar con la ayuda del seguro (puede ser ms barato con su seguro), pero el sitio web puede darle el precio si no utiliz ningn seguro.  - Puede   seguro.  - Puede imprimir el cupn correspondiente y llevarlo con su receta a la farmacia.  - Tambin puede pasar por nuestra oficina durante el horario de atencin regular y [employer] una tarjeta de cupones de GoodRx.  - Si necesita que su receta se enve electrnicamente a una farmacia diferente, informe a nuestra oficina a travs de MyChart de [place] o por telfono llamando al [phone] y presione la opcin 4.

## 2022-05-25 LAB — ANAEROBIC AND AEROBIC CULTURE

## 2022-05-27 LAB — ANAEROBIC AND AEROBIC CULTURE

## 2022-05-30 ENCOUNTER — Telehealth: Payer: Self-pay

## 2022-05-30 NOTE — Telephone Encounter (Signed)
Called patient to discuss C&S results. Phone went straight to voicemail. LMOVM to C/B

## 2022-05-30 NOTE — Telephone Encounter (Signed)
-----   Message from Alfonso Patten, MD sent at 05/30/2022  5:09 PM EST ----- Culture grew multiple bacteria. If her rash is completely resolved, we do not need to do another antibiotic pill, but if she still has any bumps or tender areas, please let me know (text me to let me know to come back on and review since I am out of office), and we will give her another antibiotic if needed.  MAs please call. Thank you!

## 2022-05-31 ENCOUNTER — Telehealth: Payer: Self-pay

## 2022-05-31 ENCOUNTER — Ambulatory Visit: Payer: Medicaid Other | Admitting: Dermatology

## 2022-05-31 ENCOUNTER — Other Ambulatory Visit: Payer: Self-pay | Admitting: Dermatology

## 2022-05-31 MED ORDER — CIPROFLOXACIN HCL 750 MG PO TABS: ORAL_TABLET | ORAL | 0 refills | Status: DC

## 2022-05-31 NOTE — Telephone Encounter (Signed)
Called and left a voicemail for daughter to return my call so I can review medication that is being sent in and schedule appointment.

## 2022-05-31 NOTE — Telephone Encounter (Signed)
Please send in ciprofloxacin 750 mg twice a day for 7 days and schedule for recheck in 1 week (ok to add to end of my clinic either day). She has a bacterial infection resistant to many antibiotics, and the only pill treatment for it is this ciprofloxacin antibiotic. If that does not clear the infection, she would need IV antibiotics.   Rarely the ciprofloxacin antibiotic can cause inflammation or tearing of a tendon, so if she develops joint pain anywhere, she should stop the medicine and seek medical care. It also can increase the risk of a serious gut infection called C. Difficile which would show up typically as new or worsened diarrhea anytime in the next 2-3 months. Eating yogurt or taking a probiotic each day may help protect against C. Difficile infection, and if she develops diarrhea in the next 2-3 months, she should get checked for the infection.   Thank you!

## 2022-05-31 NOTE — Telephone Encounter (Signed)
Opened in error

## 2022-05-31 NOTE — Telephone Encounter (Signed)
-----   Message from Alfonso Patten, MD sent at 05/30/2022  5:09 PM EST ----- Culture grew multiple bacteria. If her rash is completely resolved, we do not need to do another antibiotic pill, but if she still has any bumps or tender areas, please let me know (text me to let me know to come back on and review since I am out of office), and we will give her another antibiotic if needed.  MAs please call. Thank you!

## 2022-05-31 NOTE — Telephone Encounter (Signed)
I spoke with patient's daughter and informed her of culture results. Daughter stated that patient has one day of antibiotics left, but she still has a tender painful bump in her left axilla. Please advise.

## 2022-05-31 NOTE — Addendum Note (Signed)
Addended by: Rudell Cobb A on: 05/31/2022 04:53 PM   Modules accepted: Orders

## 2022-05-31 NOTE — Telephone Encounter (Signed)
Does Dr. Nicole Kindred have an opening to evaluate in clinic to see if she needs I&D at another site? I reached out to ID regarding antibiotic since the bacteria that grew was resistant to a lot of antibiotics.

## 2022-06-05 NOTE — Telephone Encounter (Signed)
I spoke with daughter [name] and informed her of information and schedule follow up appointment for next week.

## 2022-06-13 ENCOUNTER — Ambulatory Visit: Payer: Medicaid Other | Admitting: Dermatology

## 2022-08-29 ENCOUNTER — Ambulatory Visit: Payer: Medicaid Other | Admitting: Dermatology

## 2022-10-12 ENCOUNTER — Ambulatory Visit: Payer: Medicaid Other | Admitting: Nurse Practitioner

## 2022-10-15 ENCOUNTER — Emergency Department: Payer: Medicaid Other

## 2022-10-15 ENCOUNTER — Other Ambulatory Visit: Payer: Self-pay

## 2022-10-15 DIAGNOSIS — J029 Acute pharyngitis, unspecified: Secondary | ICD-10-CM | POA: Insufficient documentation

## 2022-10-15 DIAGNOSIS — R1084 Generalized abdominal pain: Secondary | ICD-10-CM | POA: Insufficient documentation

## 2022-10-15 DIAGNOSIS — E876 Hypokalemia: Secondary | ICD-10-CM | POA: Diagnosis not present

## 2022-10-15 DIAGNOSIS — R1030 Lower abdominal pain, unspecified: Secondary | ICD-10-CM | POA: Diagnosis present

## 2022-10-15 LAB — COMPREHENSIVE METABOLIC PANEL
ALT: 18 U/L (ref 0–44)
AST: 19 U/L (ref 15–41)
Albumin: 3.7 g/dL (ref 3.5–5.0)
Alkaline Phosphatase: 87 U/L (ref 38–126)
Anion gap: 9 (ref 5–15)
BUN: 18 mg/dL (ref 8–23)
CO2: 25 mmol/L (ref 22–32)
Calcium: 8.9 mg/dL (ref 8.9–10.3)
Chloride: 105 mmol/L (ref 98–111)
Creatinine, Ser: 0.82 mg/dL (ref 0.44–1.00)
GFR, Estimated: >60 mL/min (ref >60)
Glucose, Bld: 178 mg/dL — ABNORMAL HIGH (ref 70–99)
Potassium: 3.3 mmol/L — ABNORMAL LOW (ref 3.5–5.1)
Sodium: 139 mmol/L (ref 135–145)
Total Bilirubin: 1.1 mg/dL (ref 0.3–1.2)
Total Protein: 7.7 g/dL (ref 6.5–8.1)

## 2022-10-15 LAB — CBC
HCT: 39.3 % (ref 36.0–46.0)
Hemoglobin: 12.1 g/dL (ref 12.0–15.0)
MCH: 25.4 pg — ABNORMAL LOW (ref 26.0–34.0)
MCHC: 30.8 g/dL (ref 30.0–36.0)
MCV: 82.4 fL (ref 80.0–100.0)
Platelets: 215 10*3/uL (ref 150–400)
RBC: 4.77 MIL/uL (ref 3.87–5.11)
RDW: 13.8 % (ref 11.5–15.5)
WBC: 9.1 10*3/uL (ref 4.0–10.5)
nRBC: 0 % (ref 0.0–0.2)

## 2022-10-15 LAB — CBG MONITORING, ED: Glucose-Capillary: 160 mg/dL — ABNORMAL HIGH (ref 70–99)

## 2022-10-15 LAB — LIPASE, BLOOD: Lipase: 26 U/L (ref 11–51)

## 2022-10-15 MED ORDER — IPRATROPIUM-ALBUTEROL 0.5-2.5 (3) MG/3ML IN SOLN
3.0000 mL | Freq: Once | RESPIRATORY_TRACT | Status: AC
  Administered 2022-10-15: 3 mL via RESPIRATORY_TRACT
  Filled 2022-10-15: qty 3

## 2022-10-15 MED ORDER — CLONAZEPAM 0.5 MG PO TABS
0.5000 mg | ORAL_TABLET | Freq: Once | ORAL | Status: AC
  Administered 2022-10-15: 0.5 mg via ORAL
  Filled 2022-10-15: qty 1

## 2022-10-15 MED ORDER — IOHEXOL 300 MG/ML  SOLN
100.0000 mL | Freq: Once | INTRAMUSCULAR | Status: AC | PRN
  Administered 2022-10-15: 100 mL via INTRAVENOUS

## 2022-10-15 NOTE — ED Triage Notes (Signed)
First nurse note: Arrived by EMS from home. Bilateral stomach pain and throat pain. No fever  Currently has trach and present the past five years.   EMS vitals: 113HR 147/94 b/p 100% O2 5L continuous

## 2022-10-15 NOTE — ED Provider Triage Note (Signed)
Emergency Medicine Provider Triage Evaluation Note  Michelle [name], a 62 y.o. female  was evaluated in triage.  Pt complains of generalized abdominal pain and small, firm stools. She reports a low-grade fever of 100.8 F this morning. Patient presents via EMS from her facility, she has a trach in place, and is on 5L O2 chronically.   Review of Systems  Positive: Gen abd pain, constipation Negative: NV  Physical Exam  BP (!) 126/97   Pulse (!) 102   Temp 98.9 F (37.2 C)   Resp 20   Ht 5' (1.524 m)   Wt 68 kg   LMP 03/06/2005 (Approximate)   SpO2 98%   BMI 29.29 kg/m  Gen:   Awake, no distress  NAD Resp:  Normal effort Trach in place MSK:   Moves extremities without difficulty  ABD:  Soft, nontender  Medical Decision Making  Medically screening exam initiated at 6:33 PM.  Appropriate orders placed.  Michelle [name] was informed that the remainder of the evaluation will be completed by another provider, this initial triage assessment does not replace that evaluation, and the importance of remaining in the ED until their evaluation is complete.  Patient to the ED for evaluation of generalized abdominal pain with low-grade fever this morning.   [name], PA-C 10/15/22 1943

## 2022-10-15 NOTE — ED Triage Notes (Signed)
Pt to ED for generalized abd pain and sore throat when swallowing started yesterday. Daughter reports pt was running fever, gave tylenol today.  Trach in place, on 5 L chronic

## 2022-10-16 ENCOUNTER — Emergency Department
Admission: EM | Admit: 2022-10-16 | Discharge: 2022-10-16 | Disposition: A | Discharge status: Home / Self Care | Payer: Medicaid Other | Attending: Emergency Medicine | Admitting: Emergency Medicine

## 2022-10-16 DIAGNOSIS — R1084 Generalized abdominal pain: Secondary | ICD-10-CM

## 2022-10-16 MED ORDER — IPRATROPIUM-ALBUTEROL 0.5-2.5 (3) MG/3ML IN SOLN
3.0000 mL | Freq: Once | RESPIRATORY_TRACT | Status: AC
  Administered 2022-10-16: 3 mL via RESPIRATORY_TRACT
  Filled 2022-10-16: qty 3

## 2022-10-16 MED ORDER — ONDANSETRON HCL 4 MG/2ML IJ SOLN
4.0000 mg | INTRAMUSCULAR | Status: AC
  Administered 2022-10-16: 4 mg via INTRAVENOUS
  Filled 2022-10-16: qty 2

## 2022-10-16 MED ORDER — CLONAZEPAM 0.5 MG PO TABS
0.5000 mg | ORAL_TABLET | ORAL | Status: AC
  Administered 2022-10-16: 0.5 mg via ORAL
  Filled 2022-10-16: qty 1

## 2022-10-16 MED ORDER — FOSFOMYCIN TROMETHAMINE 3 G PO PACK
3.0000 g | PACK | Freq: Once | ORAL | Status: AC
  Administered 2022-10-16: 3 g via ORAL
  Filled 2022-10-16: qty 3

## 2022-10-16 MED ORDER — ONDANSETRON 4 MG PO TBDP: 4.0000 mg | ORAL_TABLET | Freq: Once | ORAL | Status: DC

## 2022-10-16 NOTE — ED Provider Notes (Signed)
[facility] Provider Note    Event Date/Time   First MD Initiated Contact with Patient 10/16/22 [phone]     (approximate)   History   Abdominal Pain and Sore Throat   HPI Michelle [name] is a 62 y.o. 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  She presents for evaluation of lower abdominal pain.  She also has a mild sore throat.  Her daughter reports that she had a fever of just over 100 and that she gave her some Tylenol today.  The patient was worried that something worse may be going on.  She does not feel short of breath.  She said that her lower abdomen hurts on both sides and that her throat is sore.  She does not have any difficulty swallowing and does not feel like anything is swollen.  She has been having some vomiting and diarrhea recently.     Physical Exam   Triage Vital Signs: ED Triage Vitals  Enc Vitals Group     BP 10/15/22 1725 (!) 126/97     Pulse Rate 10/15/22 1725 (!) 102     Resp 10/15/22 1725 20     Temp 10/15/22 1725 98.9 F (37.2 C)     Temp src --      SpO2 10/15/22 1725 98 %     Weight 10/15/22 1725 68 kg (150 lb)     Height 10/15/22 1725 1.524 m (5')     Head Circumference --      Peak Flow --      Pain Score 10/15/22 1724 8     Pain Loc --      Pain Edu? --      Excl. in GC? --     Most recent vital signs: Vitals:   10/16/22 0252 10/16/22 0319  BP: (!) 159/100 (!) 179/75  Pulse: (!) 112 89  Resp: (!) 22 (!) 22  Temp: 98 F (36.7 C) 98.6 F (37 C)  SpO2: 100% 100%    General: Awake, alert, appears chronically ill but not in distress at this time.   CV:  Good peripheral perfusion.  Initially tachycardic, improved during her time in the emergency department. Resp:  Patient has chronic trach and is on her ventilator.  She is able to speak bites having her breaths on the ventilator.  She has some coarse breath sounds  throughout but nothing emergent. Abd:  No distention. Morbid obesity.  No appreciable tenderness to palpation throughout the abdomen although localization is difficult due to body habitus.  She has no rebound or guarding and an overall reassuring exam. Other:  No acute focal neurological deficits.   ED Results / Procedures / Treatments   Labs (all labs ordered are listed, but only abnormal results are displayed) Labs Reviewed  COMPREHENSIVE METABOLIC PANEL - Abnormal; Notable for the following components:      Result Value   Potassium 3.3 (*)    Glucose, Bld 178 (*)    All other components within normal limits  CBC - Abnormal; Notable for the following components:   MCH 25.4 (*)    All other components within normal limits  CBG MONITORING, ED - Abnormal; Notable for the following components:   Glucose-Capillary 160 (*)    All other components within normal limits  LIPASE, BLOOD     RADIOLOGY I viewed and interpreted the patient's CT abdomen/pelvis with contrast I see no  evidence of acute infection such as appendicitis or diverticulitis.  Radiology report agrees.   PROCEDURES:  Critical Care performed: No  Procedures    IMPRESSION / MDM / ASSESSMENT AND PLAN / ED COURSE  I reviewed the triage vital signs and the nursing notes.                              Differential diagnosis includes, but is not limited to, appendicitis, diverticulitis, mesenteric adenitis, viral GI illness, electrolyte or metabolic abnormality.  Patient's presentation is most consistent with acute presentation with potential threat to life or bodily function.  Labs/studies ordered: CMP, CBC, lipase, CT abdomen/pelvis  Interventions/Medications given:  Medications  clonazePAM (KLONOPIN) tablet 0.5 mg (0.5 mg Oral Given 10/15/22 1957)  ipratropium-albuterol (DUONEB) 0.5-2.5 (3) MG/3ML nebulizer solution 3 mL (3 mLs Nebulization Given by Other 10/15/22 2030)  iohexol (OMNIPAQUE) 300 MG/ML solution  100 mL (100 mLs Intravenous Contrast Given 10/15/22 2211)  ondansetron (ZOFRAN) injection 4 mg (4 mg Intravenous Given 10/16/22 0122)  clonazePAM (KLONOPIN) tablet 0.5 mg (0.5 mg Oral Given 10/16/22 0145)  ipratropium-albuterol (DUONEB) 0.5-2.5 (3) MG/3ML nebulizer solution 3 mL (3 mLs Nebulization Given 10/16/22 0156)  fosfomycin (MONUROL) packet 3 g (3 g Oral Given 10/16/22 0244)    (Note:  hospital course my include additional interventions and/or labs/studies not listed above.)   Vital signs are stable and within normal limits.  Initially she was mildly tachycardic but that improved while she was in the ED.  She has no tenderness to palpation the abdomen and no acute respiratory abnormalities over her baseline.  Lab work is all reassuring with an essentially normal CMP, no leukocytosis nor anemia on CBC, and no evidence of pneumonia on chest x-ray.  She feels well enough that she was laughing and joking with me during my interview and exam with her.  I do believe that she feels ill but there is no evidence of an emergent condition at this time that would require admission.  However, I think it is reasonable to check for a urinary tract infection, particularly given her body habitus which may predispose her to difficulty with cleansing and to UTIs.  She and her daughter are in agreement with this plan.  Her CT scan was unremarkable and I think she can be discharged home but she may benefit from antibiotics.     Clinical Course as of 10/16/22 1610  Tue Oct 16, 2022  [id number] Patient urinated in the bedpan but also defecated, so the specimen was unusable.  Given her very mild symptoms but otherwise reassuring workup, and her very long stay in the emergency department (10 and half hours currently), we talked about options and the plan is to give her a dose of fosfomycin 3 g p.o. to treat what is most probably a very mild UTI.  She can follow-up with her primary care doctor for her other ongoing issues.   I provided reassurance but gave strict return precautions and she understands and agrees with the plan. [CF]  0234 Of note, I considered hospitalization given her plethora of chronic medical issues, but there is no indication at this time that she has an acute or emergent medical condition, nor that she would benefit from hospitalization. [CF]  0236 Also updated daughter in person, who agrees with the plan. [CF]    Clinical Course User Index [CF] Loleta Rose, MD     FINAL CLINICAL IMPRESSION(S) / ED DIAGNOSES  Final diagnoses:  Generalized abdominal pain     Rx / DC Orders   ED Discharge Orders     None        Note:  This document was prepared using Dragon voice recognition software and may include unintentional dictation errors.   Loleta Rose, MD 10/16/22 [phone]

## 2022-10-16 NOTE — ED Notes (Signed)
Report off to henry rn  

## 2022-10-16 NOTE — Discharge Instructions (Addendum)

## 2022-10-16 NOTE — ED Notes (Signed)
Called [facility] for transport back home 

## 2022-10-16 NOTE — ED Notes (Signed)
Pt unable to give urine sample. Family at bedside states pt had bm but no urine. Informed pt to press call light if able to give sample.

## 2022-10-16 NOTE — ED Notes (Signed)
Pts bed cleansed by this tech and [place], EDT. Pt placed on bedpan to collect urine sample.

## 2022-10-16 NOTE — ED Notes (Signed)
Pt voided and had bm in bedpan.  No urine obtained for sample.  Md aware

## 2022-11-30 IMAGING — CR DG CHEST 2V
2 series · 2 of 2 positions shown · non-contrast
Comparison: 04/26/2020

CLINICAL DATA: 61-year-old female with a history of COPD

EXAM:
CHEST - 2 VIEW

[chest lat]
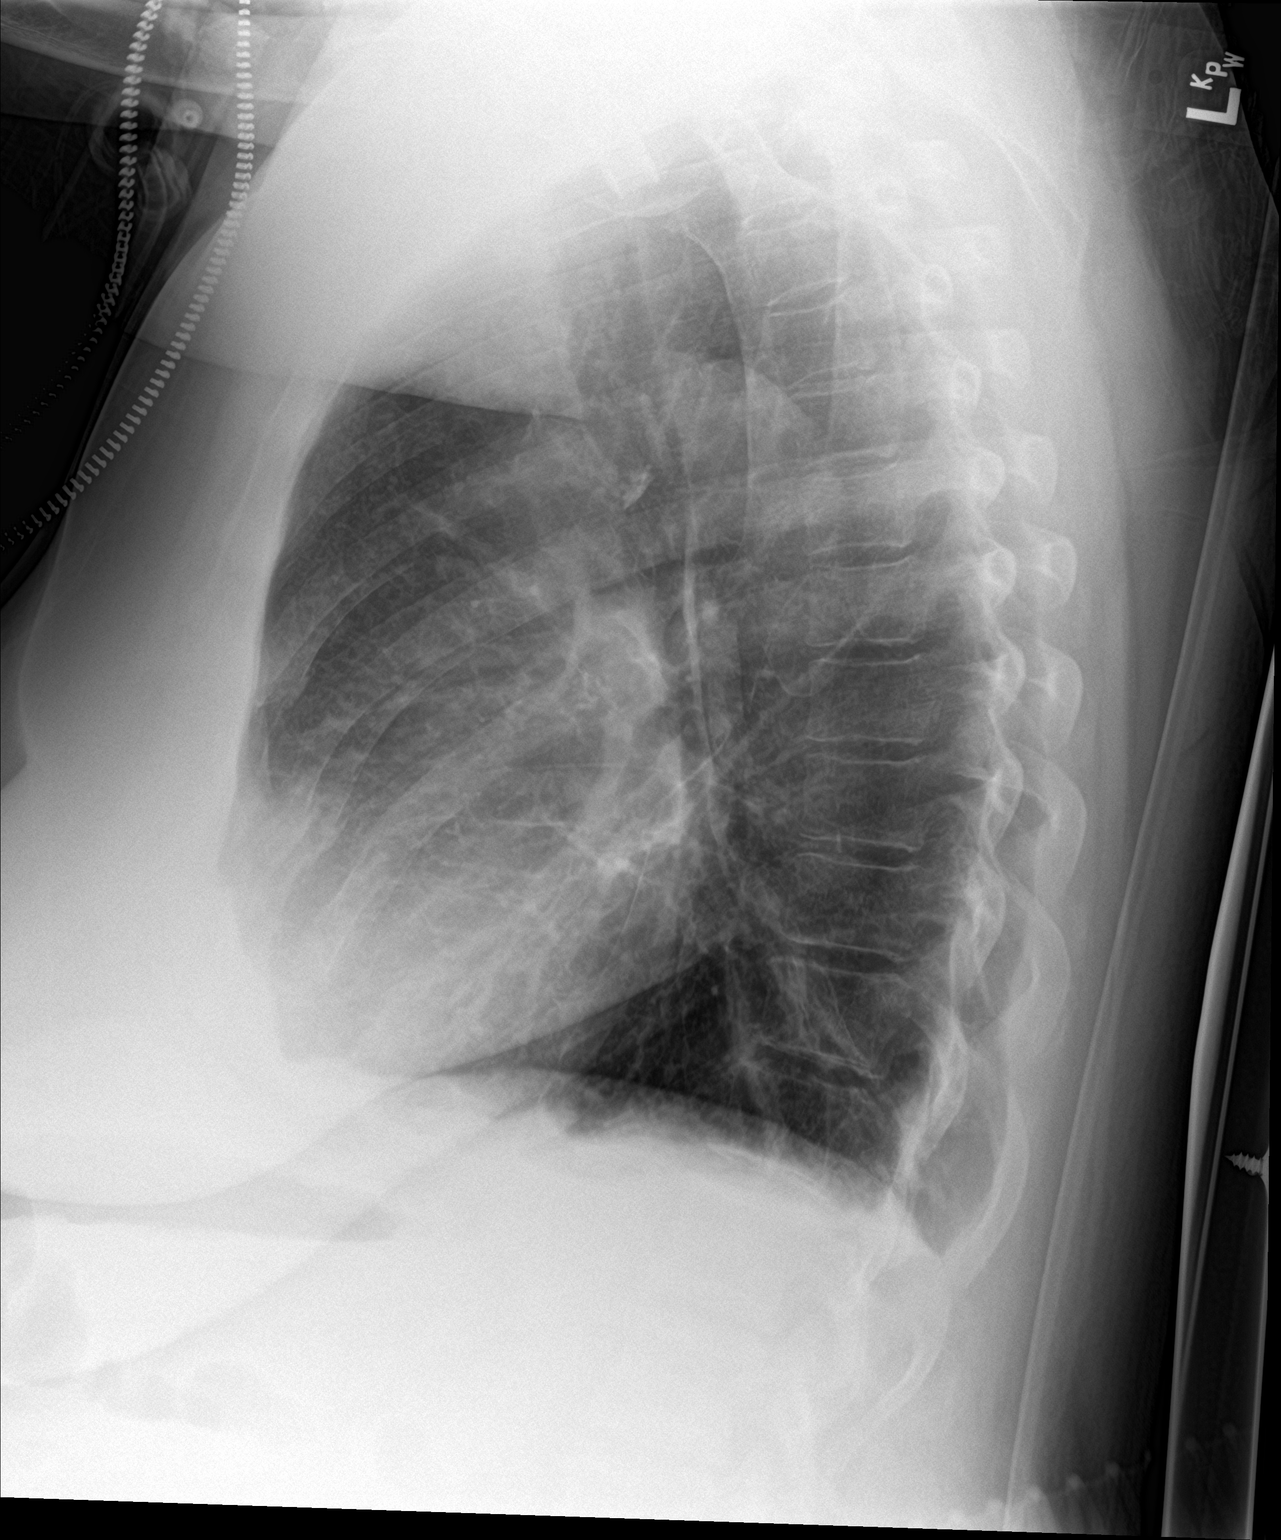

[chest ap]
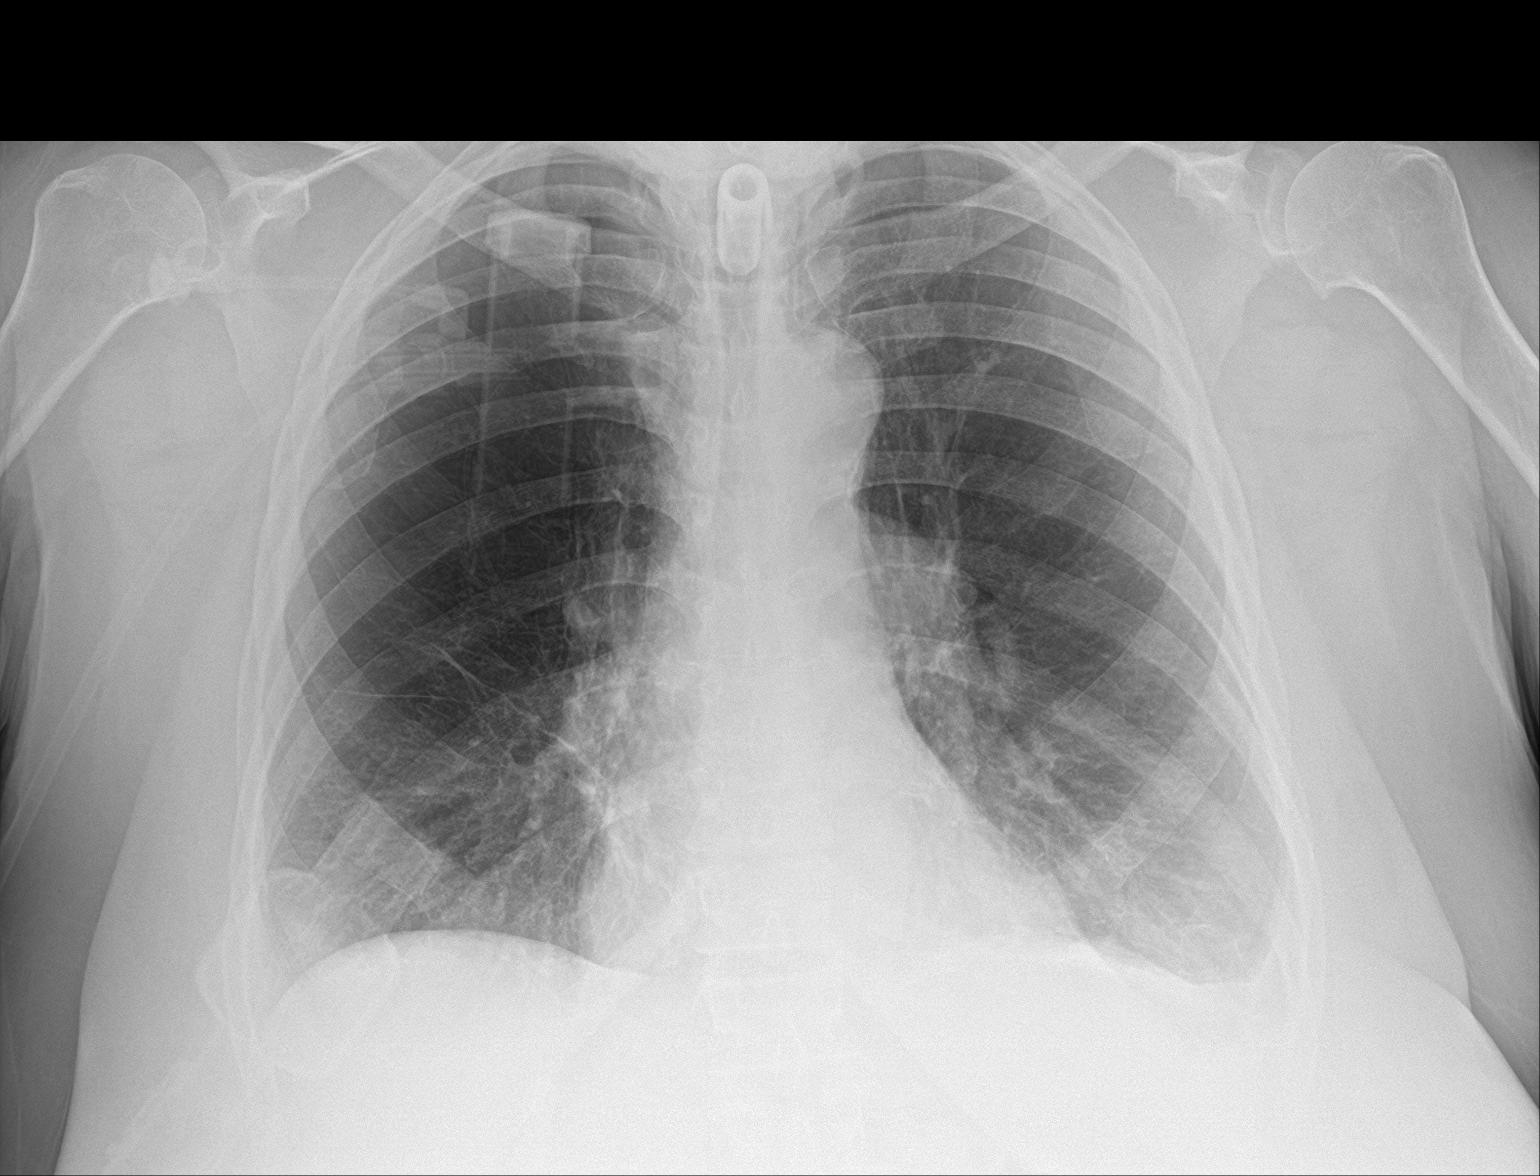

[2 of 2 positions shown; findings below may reference images not displayed]

FINDINGS: Cardiomediastinal silhouette unchanged in size and contour.
Tracheostomy unchanged.

Stigmata of emphysema, with increased retrosternal airspace,
flattened hemidiaphragms, increased AP diameter, and hyperinflation
on the AP view.

Coarsened interstitial markings with no confluent airspace disease.
No pneumothorax. No pleural effusion. No interlobular septal
thickening. Mild architectural distortion in the hilar regions is
unchanged from prior.

Rib deformities on the lateral view with early callus formation,
minimally displaced
IMPRESSION: Changes of emphysema without evidence of acute cardiopulmonary
disease.

Lateral view demonstrates subacute rib fractures, minimally
displaced.

Unchanged tracheostomy

## 2022-12-31 ENCOUNTER — Ambulatory Visit
Admission: RE | Admit: 2022-12-31 | Discharge: 2022-12-31 | Disposition: A | Discharge status: Home / Self Care | Payer: Medicaid Other | Source: Ambulatory Visit | Attending: Family Medicine | Admitting: Family Medicine

## 2022-12-31 ENCOUNTER — Ambulatory Visit
Admission: RE | Admit: 2022-12-31 | Discharge: 2022-12-31 | Disposition: A | Discharge status: Home / Self Care | Payer: Medicaid Other | Attending: Family Medicine | Admitting: Family Medicine

## 2022-12-31 ENCOUNTER — Other Ambulatory Visit: Payer: Self-pay | Admitting: Family Medicine

## 2022-12-31 DIAGNOSIS — J441 Chronic obstructive pulmonary disease with (acute) exacerbation: Secondary | ICD-10-CM

## 2023-01-13 IMAGING — CT CT ANGIO CHEST
2 of 7 series · 17 of 46 positions shown · IV contrast (APPLIED)
Comparison: Abdomen/pelvis CT 08/28/2019.  CTA chest 10/20/2018

CLINICAL DATA: Sepsis abdominal pain and respiratory distress.
Fever.



[Series 6: thins · axial · 0.72mm/px · z∈[+1200,+1464]mm · 14 of 426 slices shown]
[im 24/426  lung]
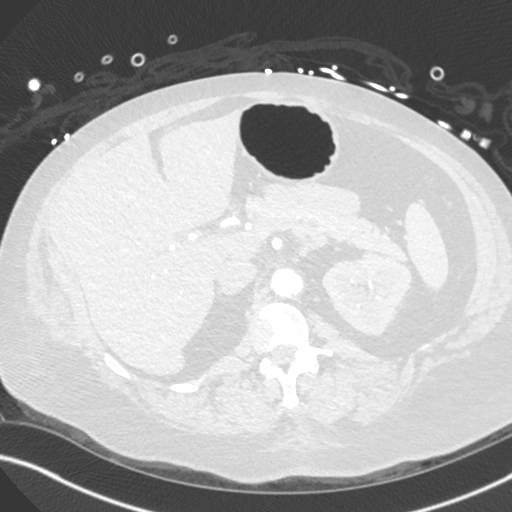
[im 48/426  soft-tissue]
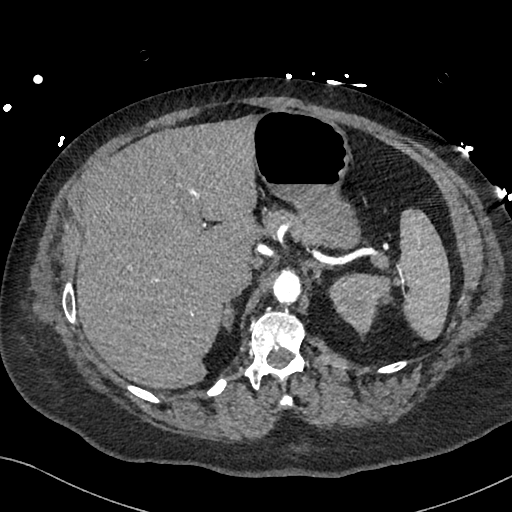
[im 95/426  lung]
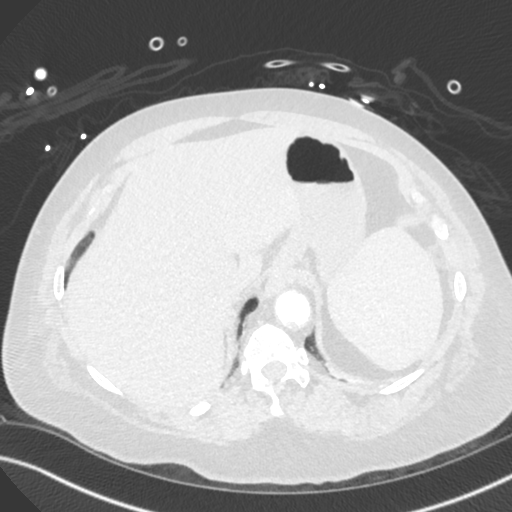
[im 119/426  soft-tissue]
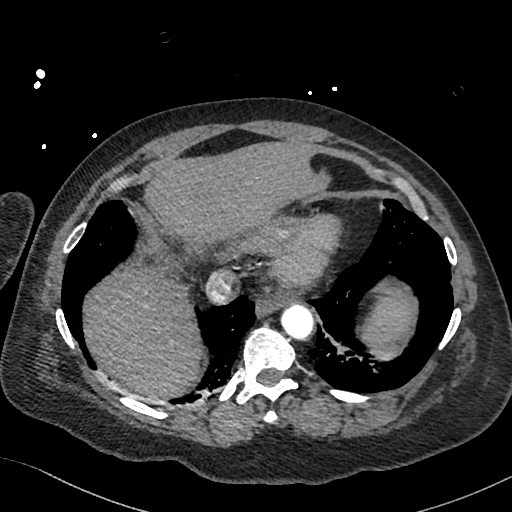
[im 142/426  lung]
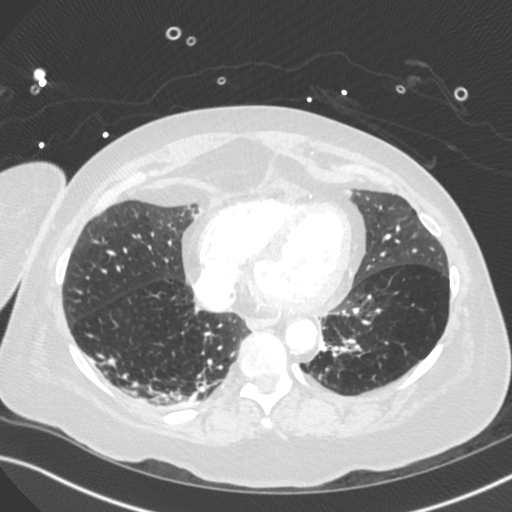
[im 166/426  soft-tissue]
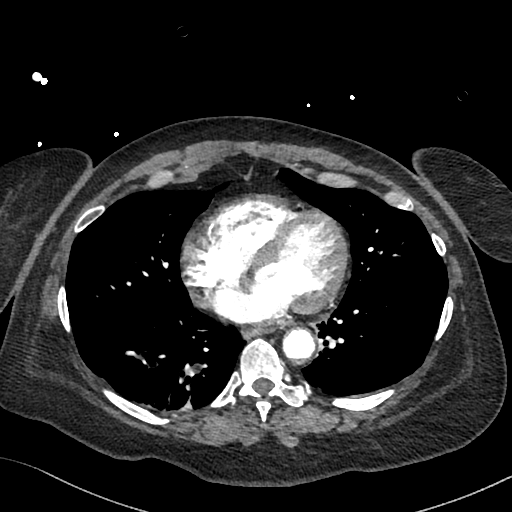
[im 189/426  lung]
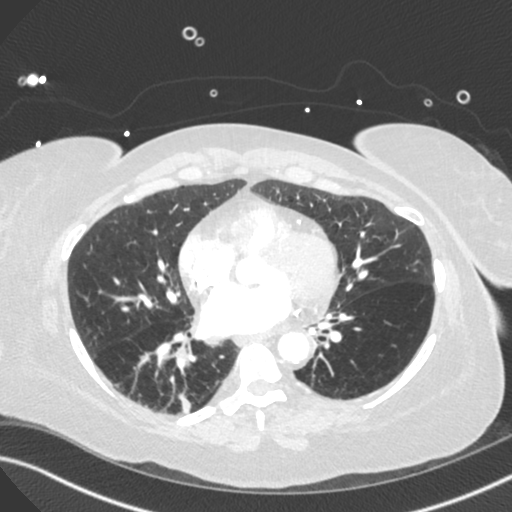
[im 237/426  soft-tissue]
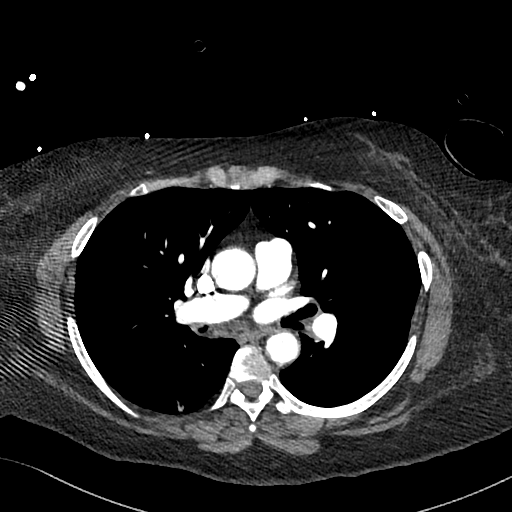
[im 260/426  lung]
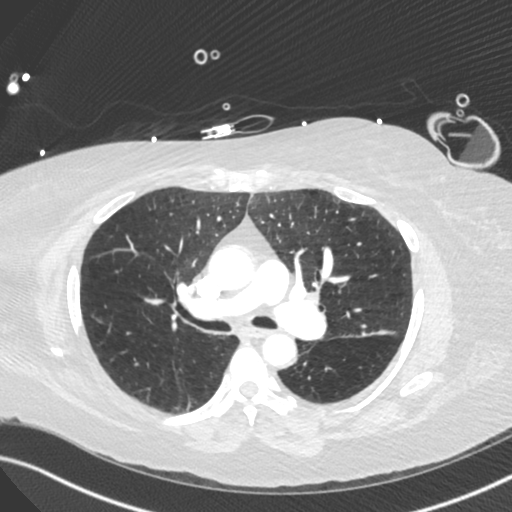
[im 284/426  soft-tissue]
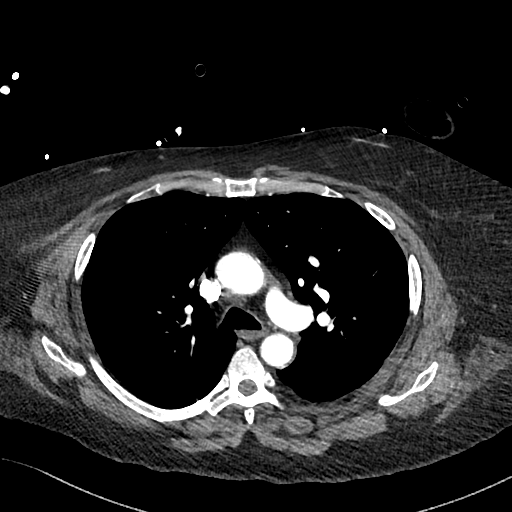
[im 307/426  lung]
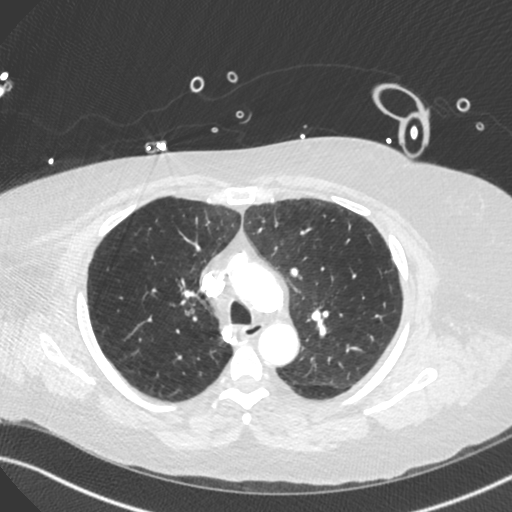
[im 331/426  soft-tissue]
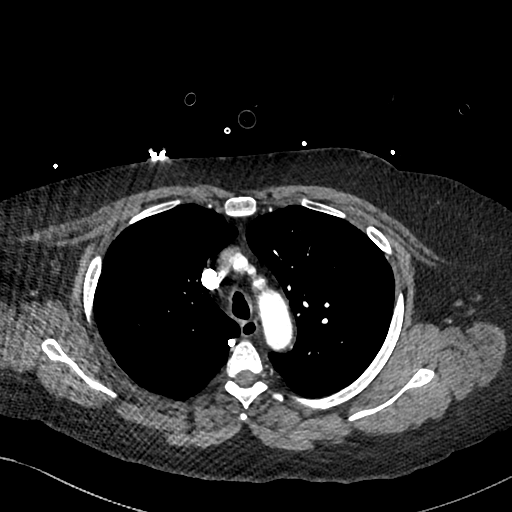
[im 378/426  lung]
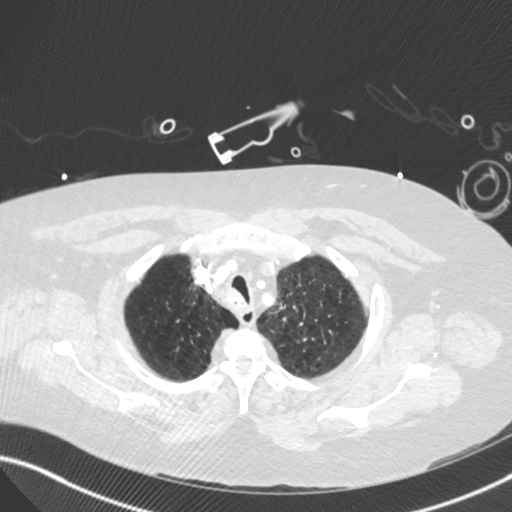
[im 402/426  soft-tissue]
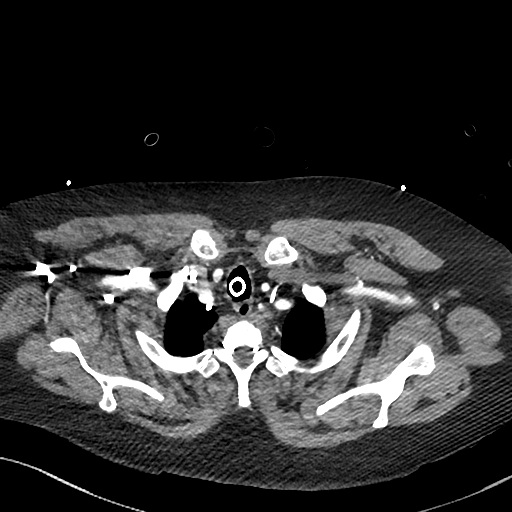

[Series 7: cor · coronal · 0.65mm/px · 3 of 152 slices shown]
[im 38/152  soft-tissue]
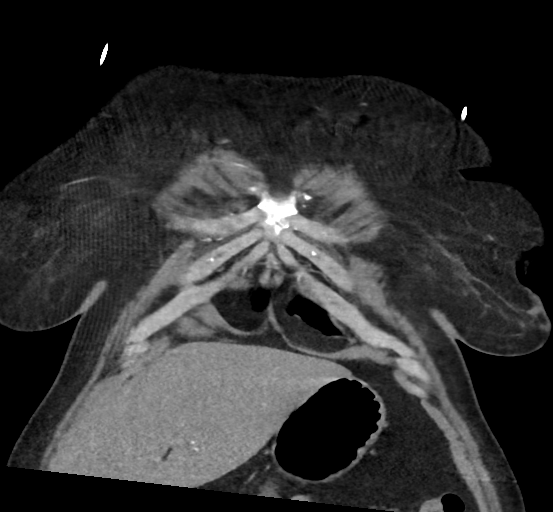
[im 76/152  soft-tissue]
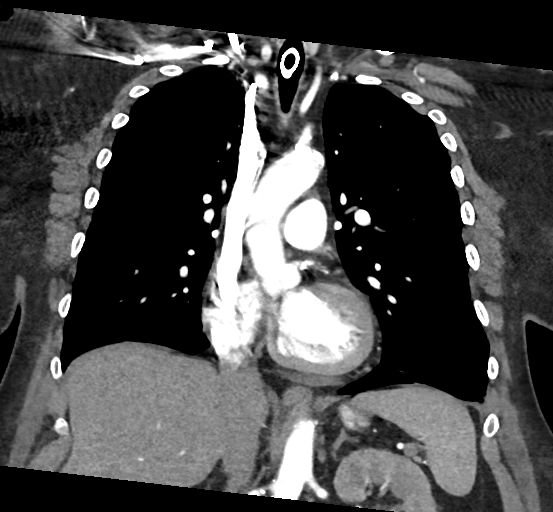
[im 114/152  soft-tissue]
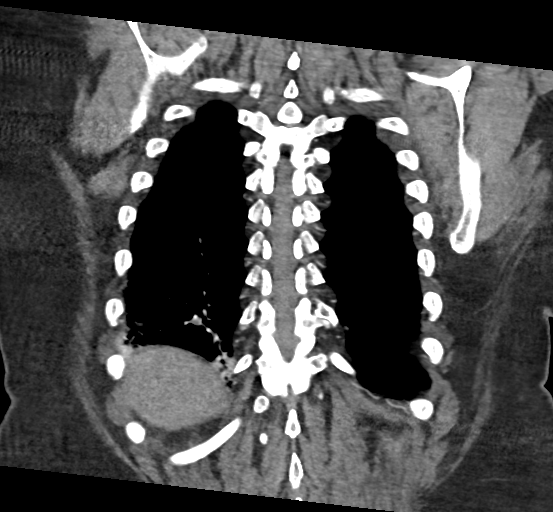

[17 of 46 positions shown; findings below may reference images not displayed]

RADIATION DOSE REDUCTION: This exam was performed according to the
departmental dose-optimization program which includes automated
exposure control, adjustment of the mA and/or kV according to
patient size and/or use of iterative reconstruction technique.

CONTRAST:  100mL OMNIPAQUE IOHEXOL 350 MG/ML SOLN
FINDINGS: CTA CHEST FINDINGS

Cardiovascular: The heart size is normal. No substantial pericardial
effusion. Mild atherosclerotic calcification is noted in the wall of
the thoracic aorta. There is no filling defect within the opacified
pulmonary arteries to suggest the presence of an acute pulmonary
embolus.

Mediastinum/Nodes: No mediastinal lymphadenopathy. There is no hilar
lymphadenopathy. The esophagus has normal imaging features. There is
no axillary lymphadenopathy.

Lungs/Pleura: Tracheostomy tube noted in-situ with with a collection
of mucus/secretions at the tip of the tracheostomy tube. Changes of
centrilobular emphysema suspected. Dependent atelectasis noted in
the lung bases. No focal airspace consolidation. No pulmonary edema
or pleural effusion.

Musculoskeletal: No worrisome lytic or sclerotic osseous
abnormality.

Review of the MIP images confirms the above findings.

CT ABDOMEN and PELVIS FINDINGS

Hepatobiliary: No suspicious focal abnormality within the liver
parenchyma. Gallbladder is nondistended without evidence of stone
disease. No intrahepatic or extrahepatic biliary dilation.

Pancreas: Stable mild prominence of the main pancreatic duct in the
head of the pancreas comparing back to 08/28/2019. No focal or
discrete pancreatic mass lesion.

Spleen: No splenomegaly. No focal mass lesion.

Adrenals/Urinary Tract: No adrenal nodule or mass. Similar
appearance interpolar right renal cyst. No followup recommended.
Left kidney unremarkable No evidence for hydroureter. Bladder is
moderately distended.

Stomach/Bowel: Stomach is unremarkable. No gastric wall thickening.
No evidence of outlet obstruction. Duodenum is normally positioned
as is the ligament of Treitz. No small bowel wall thickening. No
small bowel dilatation. The terminal ileum is normal. The appendix
is normal. No gross colonic mass. No colonic wall thickening.

Vascular/Lymphatic: There is mild atherosclerotic calcification of
the abdominal aorta without aneurysm. There is no gastrohepatic or
hepatoduodenal ligament lymphadenopathy. No retroperitoneal or
mesenteric lymphadenopathy. No pelvic sidewall lymphadenopathy.

Reproductive: The uterus is unremarkable.  There is no adnexal mass.

Other: No intraperitoneal free fluid.

Musculoskeletal: No worrisome lytic or sclerotic osseous
abnormality.

Review of the MIP images confirms the above findings.
IMPRESSION: 1. No CT evidence for acute pulmonary embolus.
2. Tracheostomy tube in-situ with a collection of mucus/secretions
at the tip of the tracheostomy tube.
3. Dependent atelectasis in the lung bases.
4. No acute findings in the abdomen or pelvis.
5. Moderate bladder distention.
6. Aortic Atherosclerosis (FBD5Q-IJC.C) and Emphysema ([id number].P).

## 2023-01-13 IMAGING — DX DG CHEST 1V PORT
2 series · 2 of 2 positions shown · non-contrast
Comparison: August 17, 2021

CLINICAL DATA: Questionable sepsis.

EXAM:
PORTABLE CHEST 1 VIEW

[chest ap (1 of 2)]
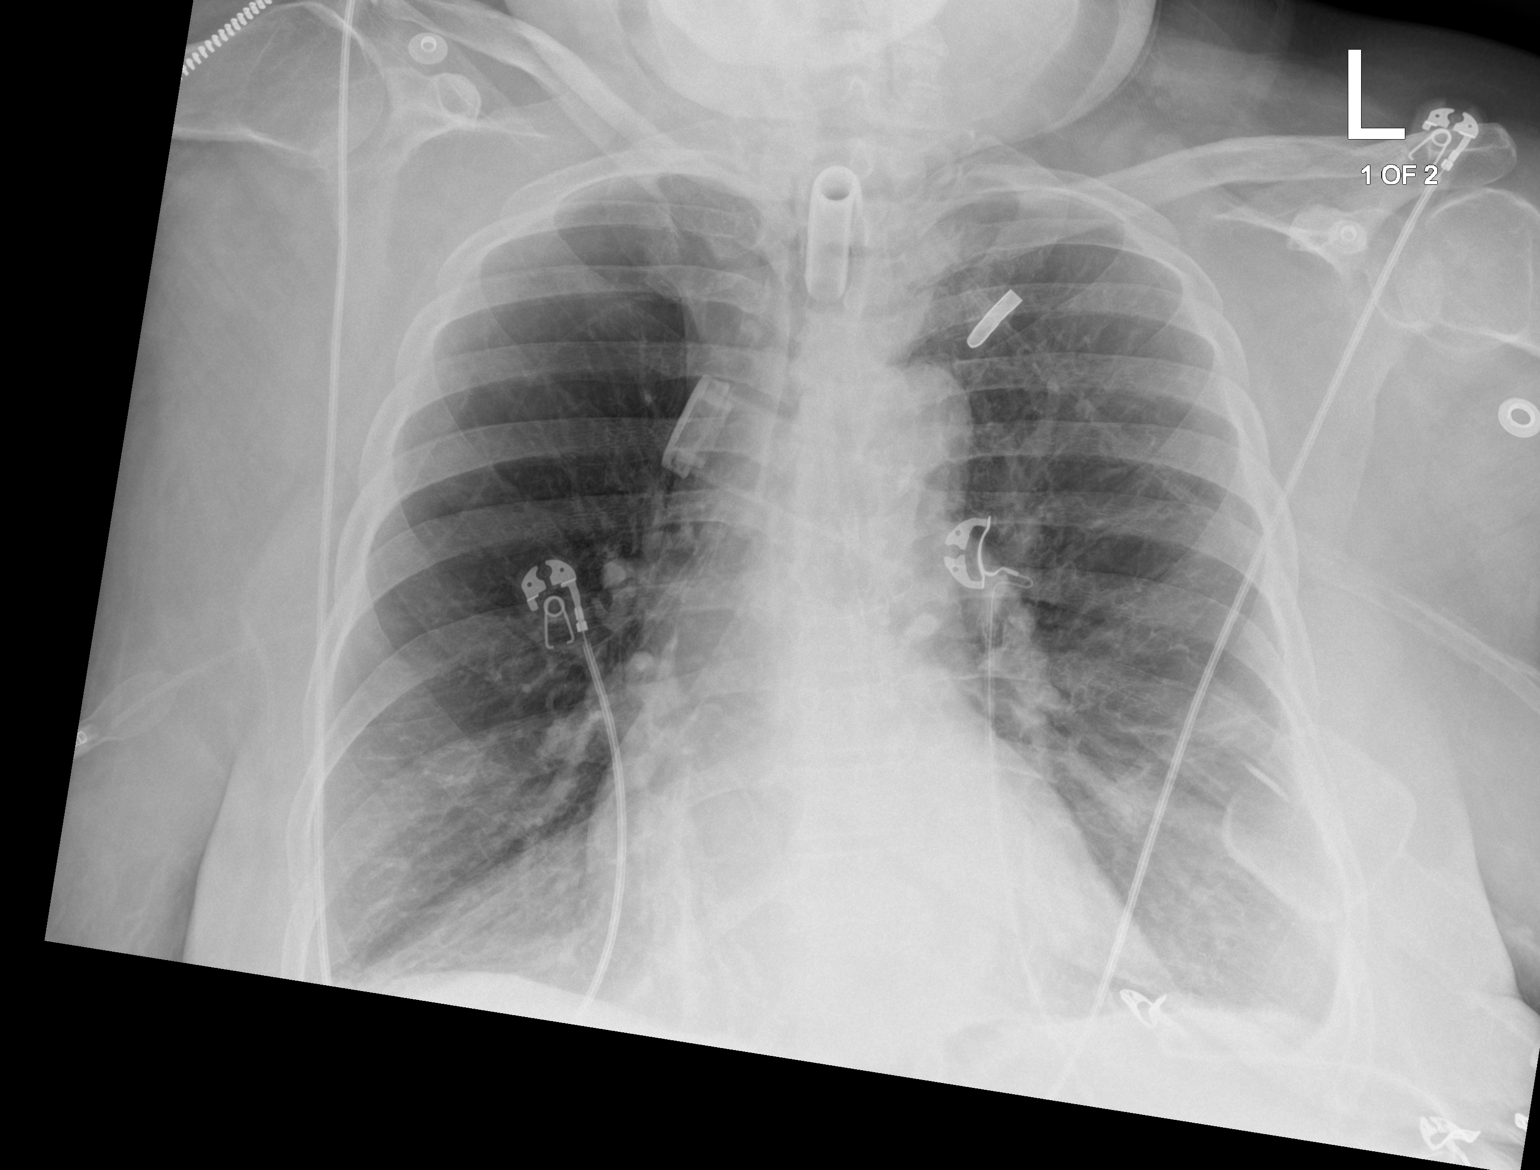

[chest ap (2 of 2)]
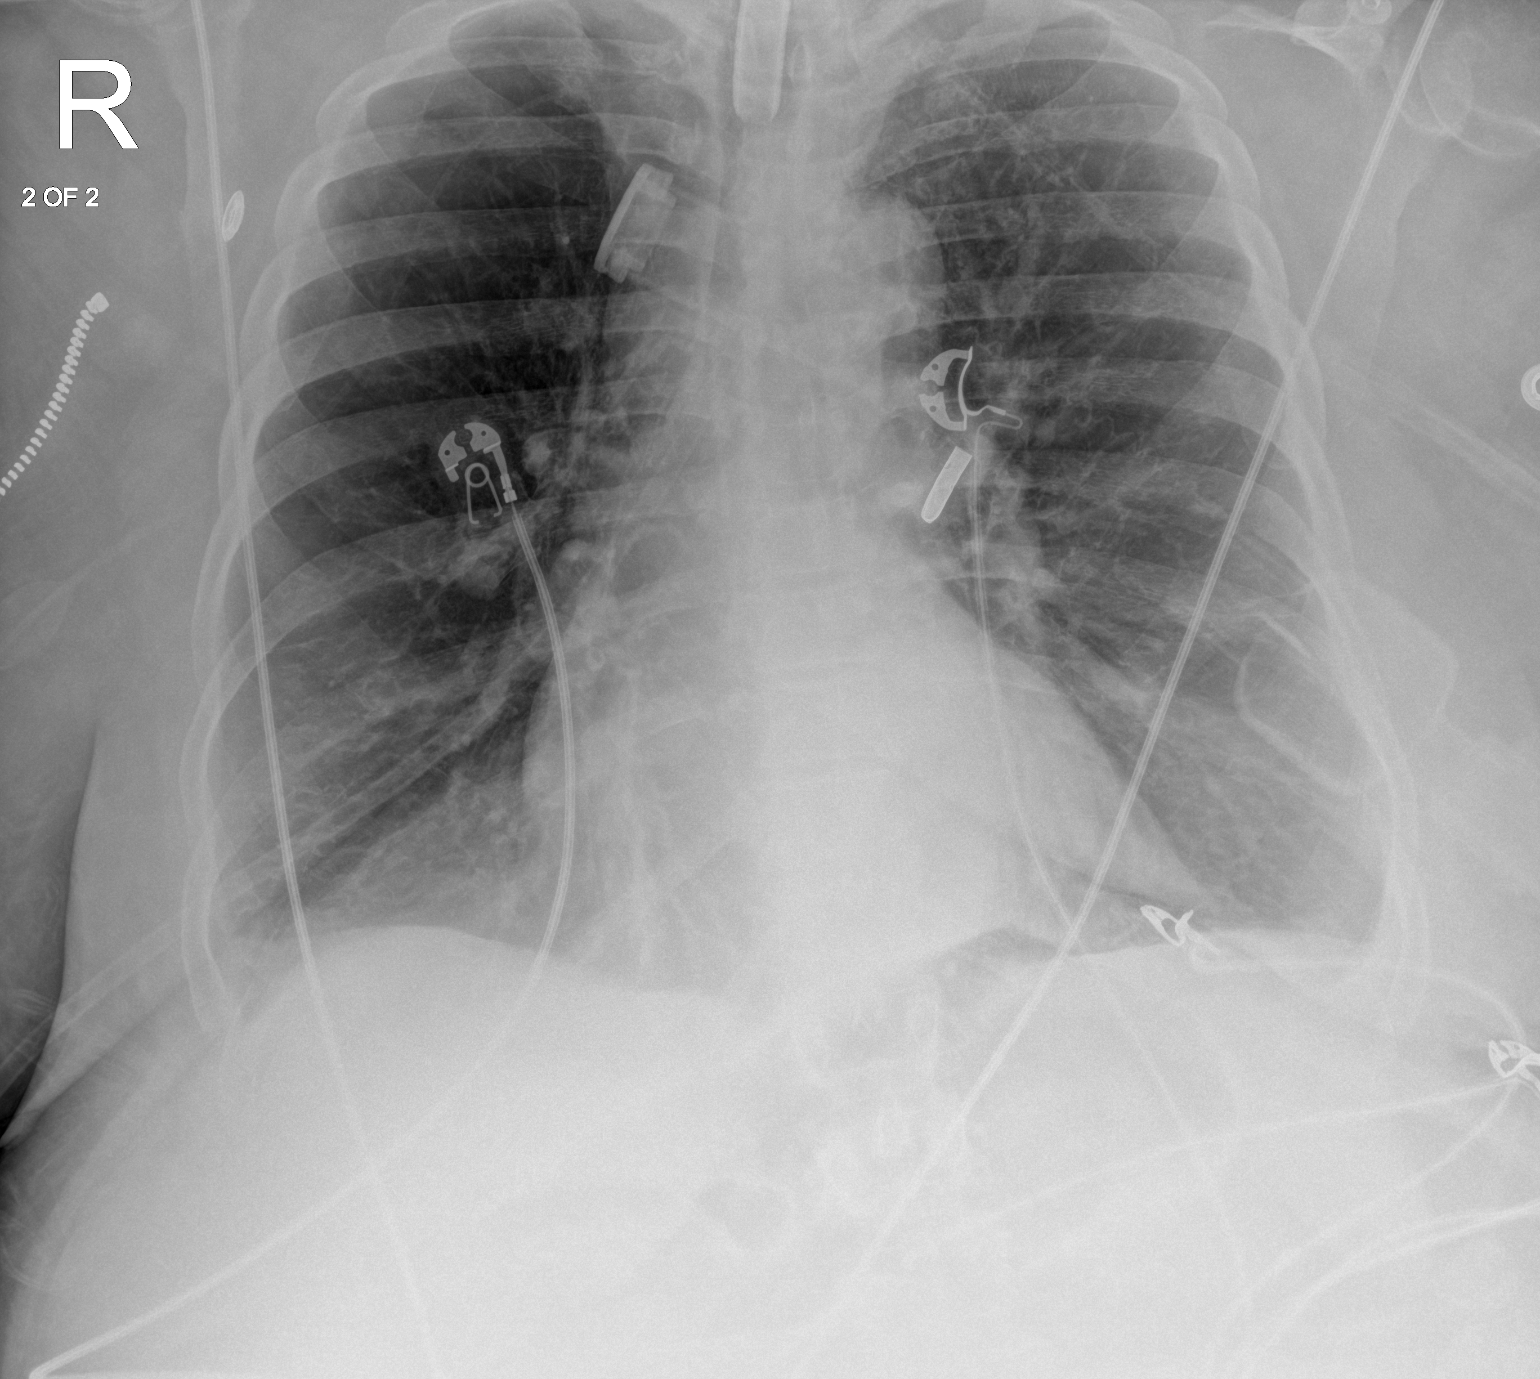

[2 of 2 positions shown; findings below may reference images not displayed]

FINDINGS: There is stable tracheostomy tube positioning. The heart size and
mediastinal contours are within normal limits. The lungs are
hyperinflated with mild, stable chronic appearing increased
interstitial lung markings. There is no evidence of acute
infiltrate, pleural effusion or pneumothorax. The visualized
skeletal structures are unremarkable.
IMPRESSION: Chronic appearing increased interstitial lung markings without
evidence of acute or active cardiopulmonary disease.

## 2023-01-13 IMAGING — CT CT ABD-PELV W/ CM
2 of 5 series · 15 of 46 positions shown, 17 images · IV contrast (APPLIED)
Comparison: Abdomen/pelvis CT 08/28/2019.  CTA chest 10/20/2018

CLINICAL DATA: Sepsis abdominal pain and respiratory distress.
Fever.



[Series 4: abdomen 5.0 · axial · 0.93mm/px · z∈[+911,+1296]mm · 12 of 92 slices shown, 14 images]
[im 8/92  soft-tissue]
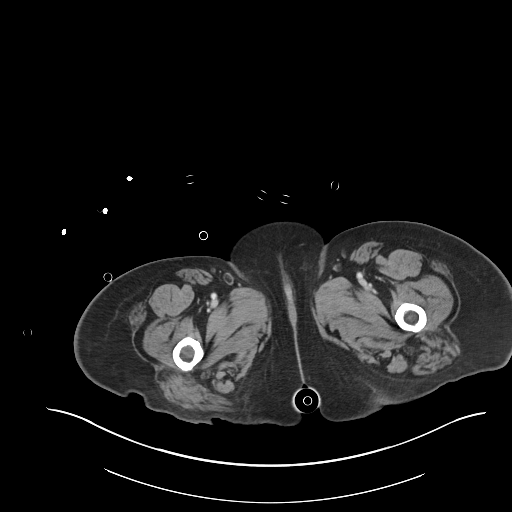
[im 8/92  bone]
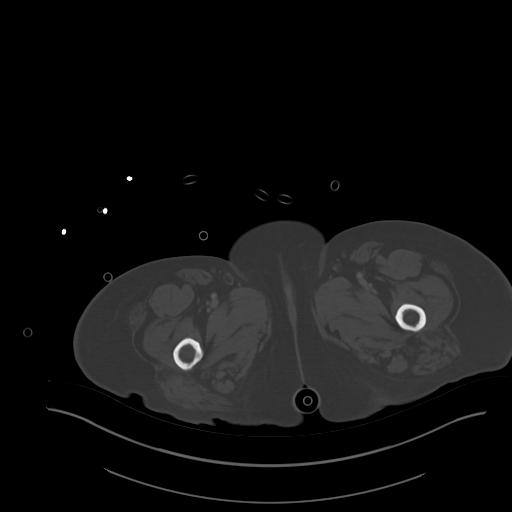
[im 15/92  soft-tissue]
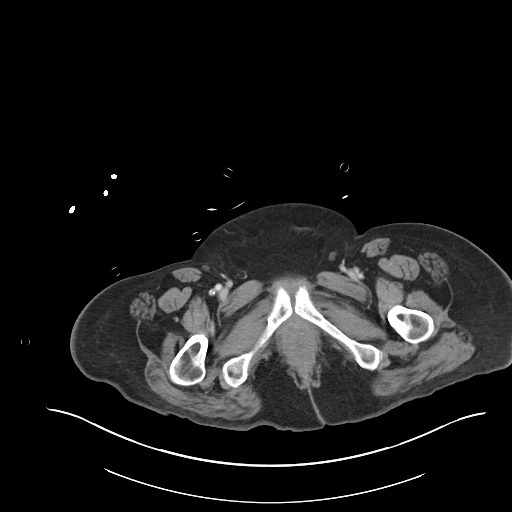
[im 22/92  soft-tissue]
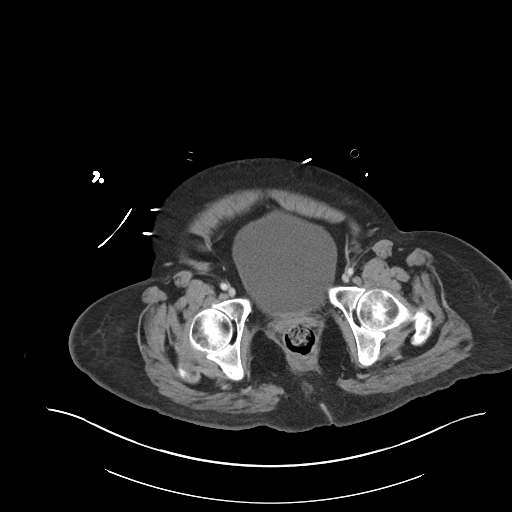
[im 29/92  soft-tissue]
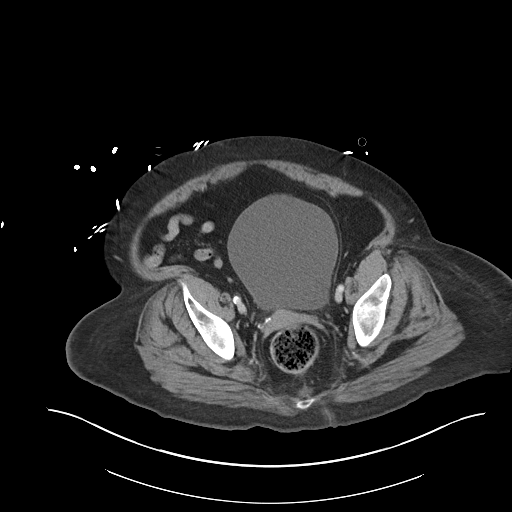
[im 36/92  soft-tissue]
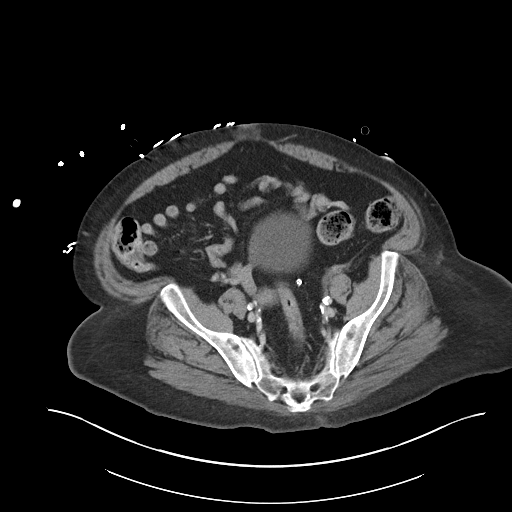
[im 43/92  soft-tissue]
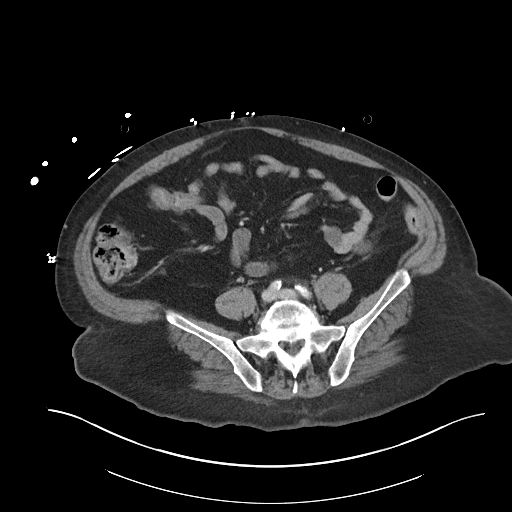
[im 50/92  soft-tissue]
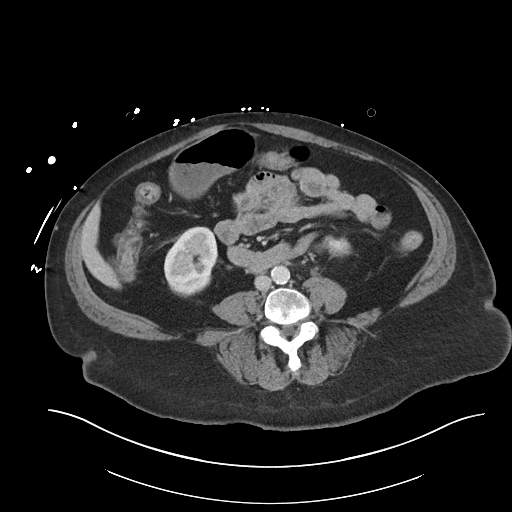
[im 57/92  soft-tissue]
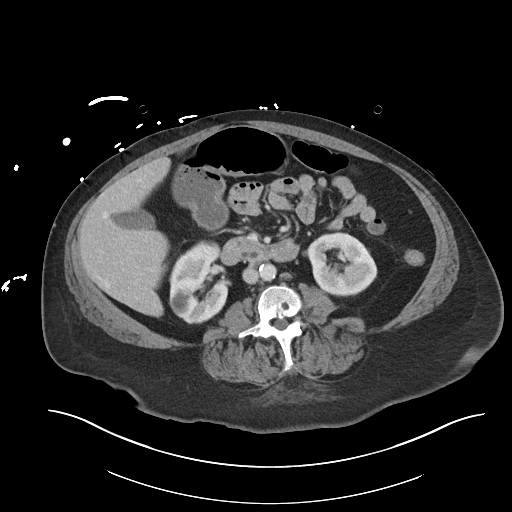
[im 64/92  soft-tissue]
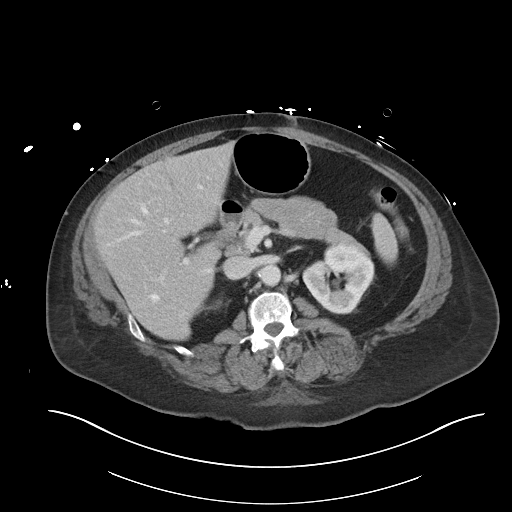
[im 64/92  bone]
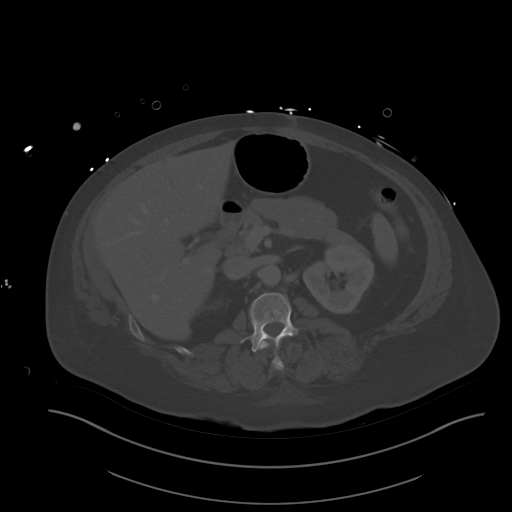
[im 71/92  soft-tissue]
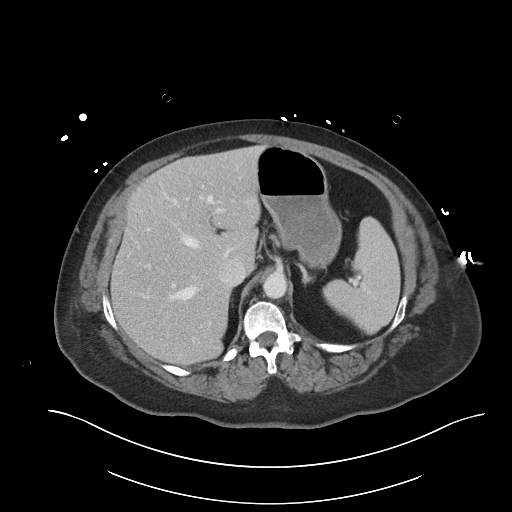
[im 78/92  soft-tissue]
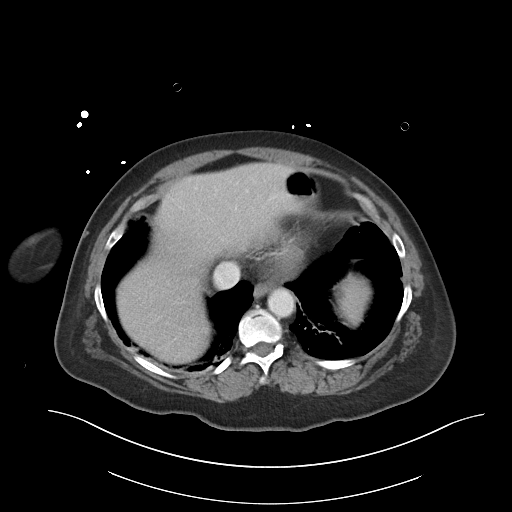
[im 85/92  soft-tissue]
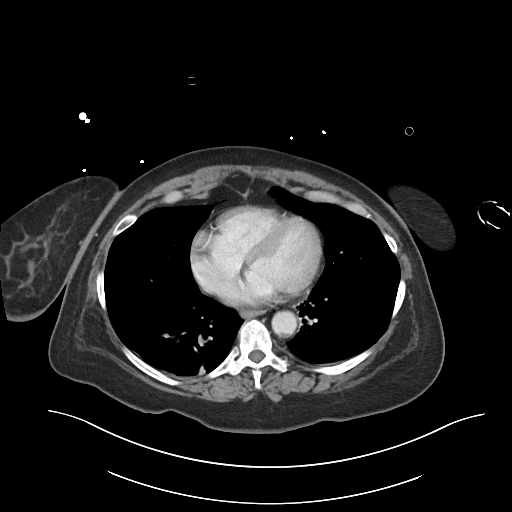

[Series 6: abdomen 3.0 mpr cor · coronal · 0.93mm/px · 3 of 103 slices shown]
[im 35/103  soft-tissue]
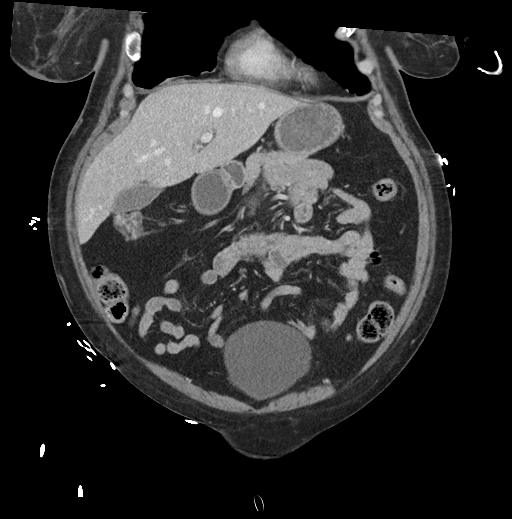
[im 46/103  soft-tissue]
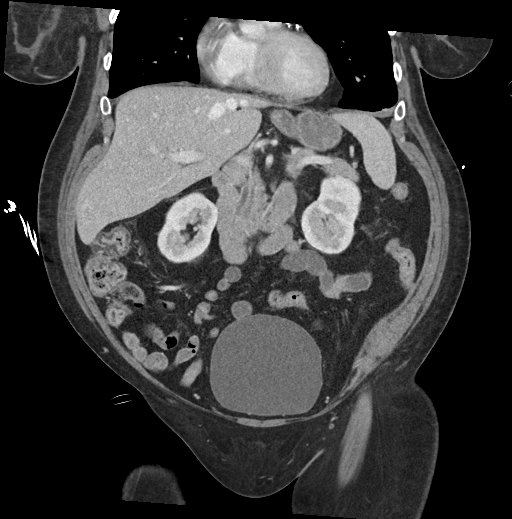
[im 57/103  soft-tissue]
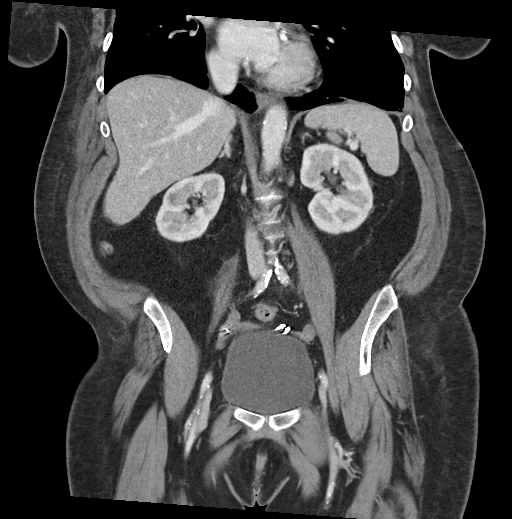

[15 of 46 positions shown; findings below may reference images not displayed]

RADIATION DOSE REDUCTION: This exam was performed according to the
departmental dose-optimization program which includes automated
exposure control, adjustment of the mA and/or kV according to
patient size and/or use of iterative reconstruction technique.

CONTRAST:  100mL OMNIPAQUE IOHEXOL 350 MG/ML SOLN
FINDINGS: CTA CHEST FINDINGS

Cardiovascular: The heart size is normal. No substantial pericardial
effusion. Mild atherosclerotic calcification is noted in the wall of
the thoracic aorta. There is no filling defect within the opacified
pulmonary arteries to suggest the presence of an acute pulmonary
embolus.

Mediastinum/Nodes: No mediastinal lymphadenopathy. There is no hilar
lymphadenopathy. The esophagus has normal imaging features. There is
no axillary lymphadenopathy.

Lungs/Pleura: Tracheostomy tube noted in-situ with with a collection
of mucus/secretions at the tip of the tracheostomy tube. Changes of
centrilobular emphysema suspected. Dependent atelectasis noted in
the lung bases. No focal airspace consolidation. No pulmonary edema
or pleural effusion.

Musculoskeletal: No worrisome lytic or sclerotic osseous
abnormality.

Review of the MIP images confirms the above findings.

CT ABDOMEN and PELVIS FINDINGS

Hepatobiliary: No suspicious focal abnormality within the liver
parenchyma. Gallbladder is nondistended without evidence of stone
disease. No intrahepatic or extrahepatic biliary dilation.

Pancreas: Stable mild prominence of the main pancreatic duct in the
head of the pancreas comparing back to 08/28/2019. No focal or
discrete pancreatic mass lesion.

Spleen: No splenomegaly. No focal mass lesion.

Adrenals/Urinary Tract: No adrenal nodule or mass. Similar
appearance interpolar right renal cyst. No followup recommended.
Left kidney unremarkable No evidence for hydroureter. Bladder is
moderately distended.

Stomach/Bowel: Stomach is unremarkable. No gastric wall thickening.
No evidence of outlet obstruction. Duodenum is normally positioned
as is the ligament of Treitz. No small bowel wall thickening. No
small bowel dilatation. The terminal ileum is normal. The appendix
is normal. No gross colonic mass. No colonic wall thickening.

Vascular/Lymphatic: There is mild atherosclerotic calcification of
the abdominal aorta without aneurysm. There is no gastrohepatic or
hepatoduodenal ligament lymphadenopathy. No retroperitoneal or
mesenteric lymphadenopathy. No pelvic sidewall lymphadenopathy.

Reproductive: The uterus is unremarkable.  There is no adnexal mass.

Other: No intraperitoneal free fluid.

Musculoskeletal: No worrisome lytic or sclerotic osseous
abnormality.

Review of the MIP images confirms the above findings.
IMPRESSION: 1. No CT evidence for acute pulmonary embolus.
2. Tracheostomy tube in-situ with a collection of mucus/secretions
at the tip of the tracheostomy tube.
3. Dependent atelectasis in the lung bases.
4. No acute findings in the abdomen or pelvis.
5. Moderate bladder distention.
6. Aortic Atherosclerosis ([id number]-[id number].C) and Emphysema ([id number].P).

## 2023-02-18 ENCOUNTER — Emergency Department: Payer: Medicaid Other

## 2023-02-18 ENCOUNTER — Other Ambulatory Visit: Payer: Self-pay

## 2023-02-18 ENCOUNTER — Inpatient Hospital Stay
Admission: EM | Admit: 2023-02-18 | Discharge: 2023-02-22 | DRG: 871 | Disposition: A | Discharge status: Home Health | Payer: Medicaid Other | Attending: Osteopathic Medicine | Admitting: Osteopathic Medicine

## 2023-02-18 DIAGNOSIS — Z880 Allergy status to penicillin: Secondary | ICD-10-CM

## 2023-02-18 DIAGNOSIS — E43 Unspecified severe protein-calorie malnutrition: Secondary | ICD-10-CM | POA: Diagnosis present

## 2023-02-18 DIAGNOSIS — U071 COVID-19: Secondary | ICD-10-CM | POA: Diagnosis present

## 2023-02-18 DIAGNOSIS — J441 Chronic obstructive pulmonary disease with (acute) exacerbation: Secondary | ICD-10-CM | POA: Diagnosis present

## 2023-02-18 DIAGNOSIS — I110 Hypertensive heart disease with heart failure: Secondary | ICD-10-CM | POA: Diagnosis present

## 2023-02-18 DIAGNOSIS — J1282 Pneumonia due to coronavirus disease 2019: Secondary | ICD-10-CM | POA: Diagnosis present

## 2023-02-18 DIAGNOSIS — J449 Chronic obstructive pulmonary disease, unspecified: Secondary | ICD-10-CM | POA: Diagnosis present

## 2023-02-18 DIAGNOSIS — Z66 Do not resuscitate: Secondary | ICD-10-CM | POA: Diagnosis present

## 2023-02-18 DIAGNOSIS — E1169 Type 2 diabetes mellitus with other specified complication: Secondary | ICD-10-CM | POA: Diagnosis present

## 2023-02-18 DIAGNOSIS — Z7951 Long term (current) use of inhaled steroids: Secondary | ICD-10-CM

## 2023-02-18 DIAGNOSIS — Z888 Allergy status to other drugs, medicaments and biological substances status: Secondary | ICD-10-CM

## 2023-02-18 DIAGNOSIS — Z794 Long term (current) use of insulin: Secondary | ICD-10-CM | POA: Diagnosis not present

## 2023-02-18 DIAGNOSIS — A419 Sepsis, unspecified organism: Secondary | ICD-10-CM | POA: Diagnosis not present

## 2023-02-18 DIAGNOSIS — R6520 Severe sepsis without septic shock: Secondary | ICD-10-CM | POA: Diagnosis present

## 2023-02-18 DIAGNOSIS — E876 Hypokalemia: Secondary | ICD-10-CM | POA: Diagnosis present

## 2023-02-18 DIAGNOSIS — A4189 Other specified sepsis: Secondary | ICD-10-CM | POA: Diagnosis present

## 2023-02-18 DIAGNOSIS — J439 Emphysema, unspecified: Secondary | ICD-10-CM | POA: Diagnosis present

## 2023-02-18 DIAGNOSIS — J440 Chronic obstructive pulmonary disease with acute lower respiratory infection: Secondary | ICD-10-CM | POA: Diagnosis present

## 2023-02-18 DIAGNOSIS — Z720 Tobacco use: Secondary | ICD-10-CM | POA: Diagnosis present

## 2023-02-18 DIAGNOSIS — I2489 Other forms of acute ischemic heart disease: Secondary | ICD-10-CM | POA: Diagnosis present

## 2023-02-18 DIAGNOSIS — E871 Hypo-osmolality and hyponatremia: Secondary | ICD-10-CM | POA: Diagnosis present

## 2023-02-18 DIAGNOSIS — E119 Type 2 diabetes mellitus without complications: Secondary | ICD-10-CM

## 2023-02-18 DIAGNOSIS — F419 Anxiety disorder, unspecified: Secondary | ICD-10-CM | POA: Diagnosis present

## 2023-02-18 DIAGNOSIS — R631 Polydipsia: Secondary | ICD-10-CM | POA: Diagnosis present

## 2023-02-18 DIAGNOSIS — F418 Other specified anxiety disorders: Secondary | ICD-10-CM | POA: Diagnosis present

## 2023-02-18 DIAGNOSIS — J189 Pneumonia, unspecified organism: Secondary | ICD-10-CM | POA: Diagnosis present

## 2023-02-18 DIAGNOSIS — I5032 Chronic diastolic (congestive) heart failure: Secondary | ICD-10-CM | POA: Diagnosis present

## 2023-02-18 DIAGNOSIS — Z7982 Long term (current) use of aspirin: Secondary | ICD-10-CM

## 2023-02-18 DIAGNOSIS — E669 Obesity, unspecified: Secondary | ICD-10-CM | POA: Diagnosis present

## 2023-02-18 DIAGNOSIS — R300 Dysuria: Secondary | ICD-10-CM | POA: Diagnosis present

## 2023-02-18 DIAGNOSIS — Z87891 Personal history of nicotine dependence: Secondary | ICD-10-CM

## 2023-02-18 DIAGNOSIS — Z823 Family history of stroke: Secondary | ICD-10-CM

## 2023-02-18 DIAGNOSIS — Z79899 Other long term (current) drug therapy: Secondary | ICD-10-CM

## 2023-02-18 DIAGNOSIS — I2510 Atherosclerotic heart disease of native coronary artery without angina pectoris: Secondary | ICD-10-CM | POA: Diagnosis present

## 2023-02-18 DIAGNOSIS — J9621 Acute and chronic respiratory failure with hypoxia: Secondary | ICD-10-CM | POA: Diagnosis present

## 2023-02-18 DIAGNOSIS — Z8249 Family history of ischemic heart disease and other diseases of the circulatory system: Secondary | ICD-10-CM

## 2023-02-18 DIAGNOSIS — Z6830 Body mass index (BMI) 30.0-30.9, adult: Secondary | ICD-10-CM

## 2023-02-18 DIAGNOSIS — Z8614 Personal history of Methicillin resistant Staphylococcus aureus infection: Secondary | ICD-10-CM

## 2023-02-18 DIAGNOSIS — F32A Depression, unspecified: Secondary | ICD-10-CM | POA: Diagnosis present

## 2023-02-18 DIAGNOSIS — Z930 Tracheostomy status: Secondary | ICD-10-CM | POA: Diagnosis not present

## 2023-02-18 DIAGNOSIS — Z803 Family history of malignant neoplasm of breast: Secondary | ICD-10-CM

## 2023-02-18 DIAGNOSIS — J95851 Ventilator associated pneumonia: Secondary | ICD-10-CM

## 2023-02-18 DIAGNOSIS — E7800 Pure hypercholesterolemia, unspecified: Secondary | ICD-10-CM | POA: Diagnosis present

## 2023-02-18 DIAGNOSIS — E1165 Type 2 diabetes mellitus with hyperglycemia: Secondary | ICD-10-CM

## 2023-02-18 DIAGNOSIS — I10 Essential (primary) hypertension: Secondary | ICD-10-CM | POA: Diagnosis present

## 2023-02-18 DIAGNOSIS — Z825 Family history of asthma and other chronic lower respiratory diseases: Secondary | ICD-10-CM

## 2023-02-18 DIAGNOSIS — R7989 Other specified abnormal findings of blood chemistry: Secondary | ICD-10-CM | POA: Insufficient documentation

## 2023-02-18 DIAGNOSIS — Z801 Family history of malignant neoplasm of trachea, bronchus and lung: Secondary | ICD-10-CM

## 2023-02-18 DIAGNOSIS — Z9911 Dependence on respirator [ventilator] status: Secondary | ICD-10-CM

## 2023-02-18 DIAGNOSIS — Z7985 Long-term (current) use of injectable non-insulin antidiabetic drugs: Secondary | ICD-10-CM

## 2023-02-18 LAB — BLOOD GAS, VENOUS
Acid-base deficit: 1.3 mmol/L (ref 0.0–2.0)
Bicarbonate: 24.3 mmol/L (ref 20.0–28.0)
O2 Saturation: 93.5 %
Patient temperature: 37
pCO2, Ven: 43 mmHg — ABNORMAL LOW (ref 44–60)
pH, Ven: 7.36 (ref 7.25–7.43)
pO2, Ven: 65 mmHg — ABNORMAL HIGH (ref 32–45)

## 2023-02-18 LAB — COMPREHENSIVE METABOLIC PANEL
ALT: 15 U/L (ref 0–44)
AST: 22 U/L (ref 15–41)
Albumin: 3.6 g/dL (ref 3.5–5.0)
Alkaline Phosphatase: 75 U/L (ref 38–126)
Anion gap: 12 (ref 5–15)
BUN: 18 mg/dL (ref 8–23)
CO2: 24 mmol/L (ref 22–32)
Calcium: 8.3 mg/dL — ABNORMAL LOW (ref 8.9–10.3)
Chloride: 96 mmol/L — ABNORMAL LOW (ref 98–111)
Creatinine, Ser: 0.7 mg/dL (ref 0.44–1.00)
GFR, Estimated: >60 mL/min (ref >60)
Glucose, Bld: 239 mg/dL — ABNORMAL HIGH (ref 70–99)
Potassium: 3.2 mmol/L — ABNORMAL LOW (ref 3.5–5.1)
Sodium: 132 mmol/L — ABNORMAL LOW (ref 135–145)
Total Bilirubin: 0.8 mg/dL (ref 0.3–1.2)
Total Protein: 8.1 g/dL (ref 6.5–8.1)

## 2023-02-18 LAB — CBC WITH DIFFERENTIAL/PLATELET
Abs Immature Granulocytes: 0.04 10*3/uL (ref 0.00–0.07)
Basophils Absolute: 0 10*3/uL (ref 0.0–0.1)
Basophils Relative: 0 %
Eosinophils Absolute: 0 10*3/uL (ref 0.0–0.5)
Eosinophils Relative: 0 %
HCT: 37.7 % (ref 36.0–46.0)
Hemoglobin: 11.7 g/dL — ABNORMAL LOW (ref 12.0–15.0)
Immature Granulocytes: 1 %
Lymphocytes Relative: 11 %
Lymphs Abs: 0.6 10*3/uL — ABNORMAL LOW (ref 0.7–4.0)
MCH: 25.4 pg — ABNORMAL LOW (ref 26.0–34.0)
MCHC: 31 g/dL (ref 30.0–36.0)
MCV: 81.8 fL (ref 80.0–100.0)
Monocytes Absolute: 0.3 10*3/uL (ref 0.1–1.0)
Monocytes Relative: 6 %
Neutro Abs: 4.5 10*3/uL (ref 1.7–7.7)
Neutrophils Relative %: 82 %
Platelets: 211 10*3/uL (ref 150–400)
RBC: 4.61 MIL/uL (ref 3.87–5.11)
RDW: 13.4 % (ref 11.5–15.5)
WBC: 5.4 10*3/uL (ref 4.0–10.5)
nRBC: 0 % (ref 0.0–0.2)

## 2023-02-18 LAB — SARS CORONAVIRUS 2 BY RT PCR: SARS Coronavirus 2 by RT PCR: POSITIVE — AB

## 2023-02-18 LAB — TROPONIN I (HIGH SENSITIVITY)
Troponin I (High Sensitivity): 24 ng/L — ABNORMAL HIGH (ref <18)
Troponin I (High Sensitivity): 18 ng/L — ABNORMAL HIGH (ref <18)

## 2023-02-18 LAB — LACTIC ACID, PLASMA
Lactic Acid, Venous: 1.1 mmol/L (ref 0.5–1.9)
Lactic Acid, Venous: 2.2 mmol/L (ref 0.5–1.9)

## 2023-02-18 LAB — GLUCOSE, CAPILLARY
Glucose-Capillary: 364 mg/dL — ABNORMAL HIGH (ref 70–99)
Glucose-Capillary: 339 mg/dL — ABNORMAL HIGH (ref 70–99)

## 2023-02-18 LAB — MAGNESIUM: Magnesium: 1.9 mg/dL (ref 1.7–2.4)

## 2023-02-18 LAB — PROCALCITONIN: Procalcitonin: 0.16 ng/mL

## 2023-02-18 LAB — MRSA NEXT GEN BY PCR, NASAL: MRSA by PCR Next Gen: NOT DETECTED

## 2023-02-18 MED ORDER — CLONIDINE HCL 0.1 MG/24HR TD PTWK
0.1000 mg | MEDICATED_PATCH | TRANSDERMAL | Status: DC
  Administered 2023-02-19: 0.1 mg via TRANSDERMAL
  Filled 2023-02-18: qty 1

## 2023-02-18 MED ORDER — INSULIN GLARGINE-YFGN 100 UNIT/ML ~~LOC~~ SOLN
5.0000 [IU] | Freq: Every day | SUBCUTANEOUS | Status: AC
  Administered 2023-02-18: 5 [IU] via SUBCUTANEOUS
  Filled 2023-02-18: qty 0.05

## 2023-02-18 MED ORDER — SODIUM CHLORIDE 0.9 % IV SOLN
100.0000 mg | Freq: Two times a day (BID) | INTRAVENOUS | Status: DC
  Administered 2023-02-18 – 2023-02-19 (×2): 100 mg via INTRAVENOUS
  Filled 2023-02-18 (×3): qty 100

## 2023-02-18 MED ORDER — AMLODIPINE BESYLATE 5 MG PO TABS
5.0000 mg | ORAL_TABLET | Freq: Every day | ORAL | Status: DC
  Administered 2023-02-19 – 2023-02-22 (×4): 5 mg via ORAL
  Filled 2023-02-18 (×4): qty 1

## 2023-02-18 MED ORDER — ENOXAPARIN SODIUM 40 MG/0.4ML IJ SOSY: 40.0000 mg | PREFILLED_SYRINGE | INTRAMUSCULAR | Status: DC

## 2023-02-18 MED ORDER — MORPHINE SULFATE (PF) 4 MG/ML IV SOLN
4.0000 mg | INTRAVENOUS | Status: AC
  Administered 2023-02-18: 4 mg via INTRAVENOUS
  Filled 2023-02-18: qty 1

## 2023-02-18 MED ORDER — ACETAMINOPHEN 650 MG RE SUPP: 650.0000 mg | Freq: Four times a day (QID) | RECTAL | Status: DC | PRN

## 2023-02-18 MED ORDER — VANCOMYCIN HCL 1250 MG/250ML IV SOLN
1250.0000 mg | INTRAVENOUS | Status: DC
  Filled 2023-02-18: qty 250

## 2023-02-18 MED ORDER — POTASSIUM CITRATE-CITRIC ACID 1100-334 MG/5ML PO SOLN: 20.0000 meq | Freq: Two times a day (BID) | ORAL | Status: DC

## 2023-02-18 MED ORDER — SODIUM CHLORIDE 0.9 % IV SOLN: 500.0000 mg | INTRAVENOUS | Status: DC

## 2023-02-18 MED ORDER — CLONAZEPAM 0.5 MG PO TABS
0.5000 mg | ORAL_TABLET | Freq: Three times a day (TID) | ORAL | Status: DC
  Administered 2023-02-19 – 2023-02-20 (×3): 0.5 mg via ORAL
  Filled 2023-02-18 (×3): qty 1

## 2023-02-18 MED ORDER — INSULIN ASPART 100 UNIT/ML IJ SOLN
0.0000 [IU] | Freq: Every day | INTRAMUSCULAR | Status: DC
  Administered 2023-02-18: 4 [IU] via SUBCUTANEOUS
  Administered 2023-02-19: 5 [IU] via SUBCUTANEOUS
  Administered 2023-02-20: 2 [IU] via SUBCUTANEOUS
  Administered 2023-02-21: 4 [IU] via SUBCUTANEOUS
  Filled 2023-02-18 (×5): qty 1

## 2023-02-18 MED ORDER — AMLODIPINE BESYLATE 5 MG PO TABS: 5.0000 mg | ORAL_TABLET | Freq: Every day | ORAL | Status: DC

## 2023-02-18 MED ORDER — SODIUM CHLORIDE 0.9 % IV SOLN
2.0000 g | Freq: Once | INTRAVENOUS | Status: AC
  Administered 2023-02-18: 2 g via INTRAVENOUS
  Filled 2023-02-18: qty 12.5

## 2023-02-18 MED ORDER — CHLORHEXIDINE GLUCONATE CLOTH 2 % EX PADS
6.0000 | MEDICATED_PAD | Freq: Every day | CUTANEOUS | Status: DC
  Administered 2023-02-18 – 2023-02-22 (×5): 6 via TOPICAL

## 2023-02-18 MED ORDER — ASPIRIN 81 MG PO CHEW
81.0000 mg | CHEWABLE_TABLET | Freq: Every day | ORAL | Status: DC
  Administered 2023-02-19 – 2023-02-22 (×4): 81 mg via ORAL
  Filled 2023-02-18 (×4): qty 1

## 2023-02-18 MED ORDER — METHYLPREDNISOLONE SODIUM SUCC 40 MG IJ SOLR
40.0000 mg | Freq: Every day | INTRAMUSCULAR | Status: AC
  Administered 2023-02-19 – 2023-02-20 (×2): 40 mg via INTRAVENOUS
  Filled 2023-02-18 (×2): qty 1

## 2023-02-18 MED ORDER — METHYLPREDNISOLONE SODIUM SUCC 125 MG IJ SOLR
125.0000 mg | Freq: Once | INTRAMUSCULAR | Status: AC
  Administered 2023-02-18: 125 mg via INTRAVENOUS
  Filled 2023-02-18: qty 2

## 2023-02-18 MED ORDER — POTASSIUM CHLORIDE 20 MEQ PO PACK
40.0000 meq | PACK | Freq: Once | ORAL | Status: AC
  Administered 2023-02-18: 40 meq via ORAL
  Filled 2023-02-18: qty 2

## 2023-02-18 MED ORDER — ORAL CARE MOUTH RINSE: 15.0000 mL | OROMUCOSAL | Status: DC | PRN

## 2023-02-18 MED ORDER — ORAL CARE MOUTH RINSE
15.0000 mL | OROMUCOSAL | Status: DC
  Administered 2023-02-19 – 2023-02-20 (×5): 15 mL via OROMUCOSAL

## 2023-02-18 MED ORDER — MIRTAZAPINE 15 MG PO TABS
30.0000 mg | ORAL_TABLET | Freq: Every day | ORAL | Status: DC
  Administered 2023-02-19 – 2023-02-21 (×3): 30 mg via ORAL
  Filled 2023-02-18 (×3): qty 2

## 2023-02-18 MED ORDER — VITAMIN D (ERGOCALCIFEROL) 1.25 MG (50000 UNIT) PO CAPS: 50000.0000 [IU] | ORAL_CAPSULE | ORAL | Status: DC

## 2023-02-18 MED ORDER — VANCOMYCIN HCL IN DEXTROSE 1-5 GM/200ML-% IV SOLN: 1000.0000 mg | Freq: Once | INTRAVENOUS | Status: DC

## 2023-02-18 MED ORDER — IPRATROPIUM-ALBUTEROL 0.5-2.5 (3) MG/3ML IN SOLN
3.0000 mL | Freq: Four times a day (QID) | RESPIRATORY_TRACT | Status: DC | PRN
  Administered 2023-02-19 – 2023-02-20 (×6): 3 mL via RESPIRATORY_TRACT
  Filled 2023-02-18 (×7): qty 3

## 2023-02-18 MED ORDER — VITAMIN D (ERGOCALCIFEROL) 1.25 MG (50000 UNIT) PO CAPS
50000.0000 [IU] | ORAL_CAPSULE | ORAL | Status: DC
  Administered 2023-02-19: 50000 [IU] via ORAL
  Filled 2023-02-18: qty 1

## 2023-02-18 MED ORDER — MIRTAZAPINE 15 MG PO TABS: 30.0000 mg | ORAL_TABLET | Freq: Every day | ORAL | Status: DC

## 2023-02-18 MED ORDER — PAROXETINE HCL 20 MG PO TABS
40.0000 mg | ORAL_TABLET | Freq: Every day | ORAL | Status: DC
  Administered 2023-02-19 – 2023-02-22 (×4): 40 mg via ORAL
  Filled 2023-02-18 (×4): qty 2

## 2023-02-18 MED ORDER — GUAIFENESIN ER 600 MG PO TB12
600.0000 mg | ORAL_TABLET | Freq: Two times a day (BID) | ORAL | Status: DC
  Administered 2023-02-19 – 2023-02-22 (×7): 600 mg via ORAL
  Filled 2023-02-18 (×7): qty 1

## 2023-02-18 MED ORDER — ASPIRIN 81 MG PO CHEW: 81.0000 mg | CHEWABLE_TABLET | Freq: Every day | ORAL | Status: DC

## 2023-02-18 MED ORDER — HYDROXYZINE HCL 10 MG PO TABS: 25.0000 mg | ORAL_TABLET | Freq: Four times a day (QID) | ORAL | Status: DC | PRN

## 2023-02-18 MED ORDER — ONDANSETRON HCL 4 MG PO TABS
4.0000 mg | ORAL_TABLET | Freq: Four times a day (QID) | ORAL | Status: DC | PRN
  Administered 2023-02-21: 4 mg via ORAL
  Filled 2023-02-18 (×2): qty 1

## 2023-02-18 MED ORDER — FENTANYL CITRATE PF 50 MCG/ML IJ SOSY
25.0000 ug | PREFILLED_SYRINGE | INTRAMUSCULAR | Status: DC | PRN
  Administered 2023-02-18 – 2023-02-19 (×2): 25 ug via INTRAVENOUS
  Filled 2023-02-18 (×2): qty 1

## 2023-02-18 MED ORDER — SODIUM CHLORIDE 0.9 % IV SOLN: INTRAVENOUS | Status: DC | PRN

## 2023-02-18 MED ORDER — SENNOSIDES-DOCUSATE SODIUM 8.6-50 MG PO TABS: 1.0000 | ORAL_TABLET | Freq: Every evening | ORAL | Status: DC | PRN

## 2023-02-18 MED ORDER — INSULIN ASPART 100 UNIT/ML IJ SOLN
0.0000 [IU] | Freq: Three times a day (TID) | INTRAMUSCULAR | Status: DC
  Administered 2023-02-18: 15 [IU] via SUBCUTANEOUS
  Administered 2023-02-19: 8 [IU] via SUBCUTANEOUS
  Administered 2023-02-19: 5 [IU] via SUBCUTANEOUS
  Administered 2023-02-19: 2 [IU] via SUBCUTANEOUS
  Administered 2023-02-20: 5 [IU] via SUBCUTANEOUS
  Administered 2023-02-20: 11 [IU] via SUBCUTANEOUS
  Administered 2023-02-20: 8 [IU] via SUBCUTANEOUS
  Administered 2023-02-21 (×2): 3 [IU] via SUBCUTANEOUS
  Administered 2023-02-21: 5 [IU] via SUBCUTANEOUS
  Administered 2023-02-22 (×2): 3 [IU] via SUBCUTANEOUS
  Administered 2023-02-22: 11 [IU] via SUBCUTANEOUS
  Filled 2023-02-18 (×12): qty 1

## 2023-02-18 MED ORDER — ONDANSETRON HCL 4 MG/2ML IJ SOLN: 4.0000 mg | Freq: Four times a day (QID) | INTRAMUSCULAR | Status: DC | PRN

## 2023-02-18 MED ORDER — SODIUM CHLORIDE 0.9 % IV SOLN
2.0000 g | Freq: Three times a day (TID) | INTRAVENOUS | Status: DC
  Administered 2023-02-18 – 2023-02-22 (×12): 2 g via INTRAVENOUS
  Filled 2023-02-18 (×13): qty 12.5

## 2023-02-18 MED ORDER — POTASSIUM CITRATE-CITRIC ACID 1100-334 MG/5ML PO SOLN
20.0000 meq | Freq: Once | ORAL | Status: DC
  Filled 2023-02-18 (×2): qty 10

## 2023-02-18 MED ORDER — ENOXAPARIN SODIUM 60 MG/0.6ML IJ SOSY
50.0000 mg | PREFILLED_SYRINGE | INTRAMUSCULAR | Status: DC
  Administered 2023-02-18: 50 mg via SUBCUTANEOUS
  Filled 2023-02-18: qty 0.6

## 2023-02-18 MED ORDER — IPRATROPIUM-ALBUTEROL 0.5-2.5 (3) MG/3ML IN SOLN
6.0000 mL | Freq: Once | RESPIRATORY_TRACT | Status: AC
  Administered 2023-02-18: 6 mL via RESPIRATORY_TRACT
  Filled 2023-02-18: qty 6

## 2023-02-18 MED ORDER — ACETAMINOPHEN 325 MG PO TABS
650.0000 mg | ORAL_TABLET | Freq: Four times a day (QID) | ORAL | Status: DC | PRN
  Administered 2023-02-21: 650 mg via ORAL
  Filled 2023-02-18: qty 2

## 2023-02-18 MED ORDER — HYDRALAZINE HCL 20 MG/ML IJ SOLN
5.0000 mg | Freq: Four times a day (QID) | INTRAMUSCULAR | Status: DC | PRN
  Administered 2023-02-19 – 2023-02-20 (×2): 5 mg via INTRAVENOUS
  Filled 2023-02-18 (×2): qty 1

## 2023-02-18 MED ORDER — DIPHENHYDRAMINE HCL 50 MG/ML IJ SOLN: 12.5000 mg | Freq: Four times a day (QID) | INTRAMUSCULAR | Status: AC | PRN

## 2023-02-18 MED ORDER — MORPHINE SULFATE (PF) 2 MG/ML IV SOLN
2.0000 mg | INTRAVENOUS | Status: DC | PRN
  Administered 2023-02-18 – 2023-02-19 (×3): 2 mg via INTRAVENOUS
  Filled 2023-02-18 (×3): qty 1

## 2023-02-18 MED ORDER — CLONAZEPAM 0.5 MG PO TABS: 0.5000 mg | ORAL_TABLET | Freq: Three times a day (TID) | ORAL | Status: DC

## 2023-02-18 MED ORDER — VANCOMYCIN HCL 2000 MG/400ML IV SOLN
2000.0000 mg | Freq: Once | INTRAVENOUS | Status: AC
  Administered 2023-02-18: 2000 mg via INTRAVENOUS
  Filled 2023-02-18: qty 400

## 2023-02-18 MED ORDER — AZELASTINE HCL 0.1 % NA SOLN
2.0000 | Freq: Two times a day (BID) | NASAL | Status: DC | PRN
  Administered 2023-02-20 – 2023-02-22 (×3): 2 via NASAL
  Filled 2023-02-18: qty 30

## 2023-02-18 MED ORDER — OXYCODONE-ACETAMINOPHEN 5-325 MG PO TABS
1.0000 | ORAL_TABLET | Freq: Once | ORAL | Status: AC
  Administered 2023-02-18: 1 via ORAL
  Filled 2023-02-18: qty 1

## 2023-02-18 NOTE — Assessment & Plan Note (Signed)
Patient no longer uses tobacco products

## 2023-02-18 NOTE — Assessment & Plan Note (Signed)
Doxycycline 100 mg IV twice daily, cefepime and vancomycin per pharmacy Consult placed to respiratory care treatment

## 2023-02-18 NOTE — ED Triage Notes (Signed)
62 y/o woman arrives via  [facility] Coming from home went to [facility] doc a few days ago and was Rx'd Abx and prednisone  Sx: difficulty breathing, chest hurt, thick yellow mucuous EMS assessment: wheezing lower lobes, did a neb Vitals: Temp: 100.8, HR: 113, 98% 6L on a vent, BG: 211 PMHx: DM, COPD

## 2023-02-18 NOTE — Consult Note (Signed)
CODE SEPSIS - PHARMACY COMMUNICATION  **Broad-spectrum antimicrobials should be administered within one hour of sepsis diagnosis**  Time Code Sepsis call or page was received: 1341  Antibiotics ordered: Cefepime, vancomycin  Time of first antibiotic administration: 1425  Additional action taken by pharmacy: N/A  If necessary, name of provider/nurse contacted: N/A    Will M. Dareen Piano, PharmD Clinical Pharmacist 02/18/2023 2:29 PM

## 2023-02-18 NOTE — Hospital Course (Addendum)
[name] is a 62 year old female with history of MRSA and Pseudomonas pneumonia, severe atypical pneumonia, COPD, on chronic trach with vent at home, history of cocaine abuse, congestive heart failure, hyperlipidemia, hypertension, history of tobacco use, history of left local axilla abscess, emphysema, coronary artery disease, anxiety, asthma, depression, insulin-dependent diabetes mellitus, history of morbid obesity, who presents to the emergency department for chief concerns of difficulty breathing, thick yellow mucus.  Per ED provider, patient had a Tmax of 100.8 at home and has taken Tylenol prior to ED presentation.  Vitals in the ED showed temperature of 100.2, respiration rate of 23, heart rate 101, blood pressure 143/78, SpO2 of 100% on tracheostomy.  Serum sodium is 132, potassium 3.2, chloride 96, bicarb 24, BUN of 18, serum creatinine of 0.70, EGFR greater than 60, nonfasting blood glucose 239, WBC 5.4, hemoglobin 11.7, platelets of 211.  ED treatment: Cefepime 2 g IV one-time dose, vancomycin 2 g IV one-time dose.

## 2023-02-18 NOTE — Assessment & Plan Note (Signed)
Home paroxetine 40 mg every morning, mirtazapine 30 mg nightly, hydroxyzine 25 mg p.o. every 6 hours as needed for anxiety, itching resumed on admission

## 2023-02-18 NOTE — Assessment & Plan Note (Signed)
Secondary to pneumonia in setting of chronic tracheostomy causing demand ischemia Repeat high since he troponin is downtrending Patient reports the chest pain is with coughing and on breathing, low clinical suspicion for ACS at this time

## 2023-02-18 NOTE — ED Notes (Signed)
ED TO INPATIENT HANDOFF REPORT  ED Nurse Name and Phone #: Margie Ege Name/Age/Gender Michelle [name] 62 y.o. female Room/Bed: ED08A/ED08A  Code Status   Code Status: Limited: Do not attempt resuscitation (DNR) -DNR-LIMITED -Do Not Intubate/DNI   Home/SNF/Other Inpatient Patient oriented to: self, place, time, and situation Is this baseline? Yes   Triage Complete: Triage complete  Chief Complaint  Sepsis [facility]) [A41.9]  Triage Note 62 y/o woman arrives via  [facility] Coming from home went to [facility] doc a few days ago and was Rx'd Abx and prednisone  Sx: difficulty breathing, chest hurt, thick yellow mucuous EMS assessment: wheezing lower lobes, did a neb Vitals: Temp: 100.8, HR: 113, 98% 6L on a vent, BG: 211 PMHx: DM, COPD   Allergies Allergies  Allergen Reactions   Amoxicillin     Patient has tolerated cephalosporins in the past   Ativan [Lorazepam]     Makes anxiety worse     Level of Care/Admitting Diagnosis ED Disposition     ED Disposition  Admit   Condition  --   Comment  Hospital Area: [facility] [[id number]]  Level of Care: Stepdown [14]  Covid Evaluation: Asymptomatic - no recent exposure (last 10 days) testing not required  Diagnosis: Sepsis [facility]) [[id number]]  Admitting Physician: Lovenia Kim [[id number]]  Attending Physician: [facility], AMY N [id number]  Certification:: I certify this patient will need inpatient services for at least 2 midnights  Expected Medical Readiness: 02/21/2023          B Medical/Surgery History Past Medical History:  Diagnosis Date   Allergy    Anxiety    Asthma    CHF (congestive heart failure) (HCC)    Cocaine abuse (HCC)    COPD (chronic obstructive pulmonary disease) (HCC)    Coronary artery disease    Emphysema/COPD (HCC)    Hepatitis C    High cholesterol    Hypertension    Pneumothorax    Tobacco abuse    Past Surgical History:  Procedure Laterality Date   CARDIAC  CATHETERIZATION     CHEST TUBE INSERTION     IR GASTROSTOMY TUBE MOD SED  10/31/2017   TRACHEOSTOMY TUBE PLACEMENT N/A 10/17/2017   Procedure: TRACHEOSTOMY;  Surgeon: Bud Face, MD;  Location: [facility] ORS;  Service: ENT;  Laterality: N/A;     A IV Location/Drains/Wounds Patient Lines/Drains/Airways Status     Active Line/Drains/Airways     Name Placement date Placement time Site Days   Peripheral IV 02/18/23 Posterior;Right Hand 02/18/23  1315  Hand  less than 1   Gastrostomy/Enterostomy Gastrostomy 20 Fr. LUQ 10/31/17  1448  LUQ  1936   Tracheostomy Shiley Flexible 6 mm Cuffed 10/05/21  1215  6 mm  501   Tracheostomy 6 mm --  --  6 mm  --            Intake/Output Last 24 hours  Intake/Output Summary (Last 24 hours) at 02/18/2023 1651 Last data filed at 02/18/2023 1250 Gross per 24 hour  Intake 1 ml  Output --  Net 1 ml    Labs/Imaging Results for orders placed or performed during the hospital encounter of 02/18/23 (from the past 48 hour(s))  CBC with Differential     Status: Abnormal   Collection Time: 02/18/23  1:10 PM  Result Value Ref Range   WBC 5.4 4.0 - 10.5 K/uL   RBC 4.61 3.87 - 5.11 MIL/uL   Hemoglobin 11.7 (L) 12.0 - 15.0  g/dL   HCT 84.1 66.0 - 63.0 %   MCV 81.8 80.0 - 100.0 fL   MCH 25.4 (L) 26.0 - 34.0 pg   MCHC 31.0 30.0 - 36.0 g/dL   RDW 16.0 10.9 - 32.3 %   Platelets 211 150 - 400 K/uL   nRBC 0.0 0.0 - 0.2 %   Neutrophils Relative % 82 %   Neutro Abs 4.5 1.7 - 7.7 K/uL   Lymphocytes Relative 11 %   Lymphs Abs 0.6 (L) 0.7 - 4.0 K/uL   Monocytes Relative 6 %   Monocytes Absolute 0.3 0.1 - 1.0 K/uL   Eosinophils Relative 0 %   Eosinophils Absolute 0.0 0.0 - 0.5 K/uL   Basophils Relative 0 %   Basophils Absolute 0.0 0.0 - 0.1 K/uL   Immature Granulocytes 1 %   Abs Immature Granulocytes 0.04 0.00 - 0.07 K/uL    Comment: Performed at [facility], [address] [place], [place] [address]  Comprehensive metabolic panel     Status:  Abnormal   Collection Time: 02/18/23  1:10 PM  Result Value Ref Range   Sodium 132 (L) 135 - 145 mmol/L   Potassium 3.2 (L) 3.5 - 5.1 mmol/L   Chloride 96 (L) 98 - 111 mmol/L   CO2 24 22 - 32 mmol/L   Glucose, Bld 239 (H) 70 - 99 mg/dL    Comment: Glucose reference range applies only to samples taken after fasting for at least 8 hours.   BUN 18 8 - 23 mg/dL   Creatinine, Ser 2.02 0.44 - 1.00 mg/dL   Calcium 8.3 (L) 8.9 - 10.3 mg/dL   Total Protein 8.1 6.5 - 8.1 g/dL   Albumin 3.6 3.5 - 5.0 g/dL   AST 22 15 - 41 U/L   ALT 15 0 - 44 U/L   Alkaline Phosphatase 75 38 - 126 U/L   Total Bilirubin 0.8 0.3 - 1.2 mg/dL   GFR, Estimated >54 >27 mL/min    Comment: (NOTE) Calculated using the CKD-EPI Creatinine Equation (2021)    Anion gap 12 5 - 15    Comment: Performed at [facility], [address] [place], [place] [address]  Troponin I (High Sensitivity)     Status: Abnormal   Collection Time: 02/18/23  1:10 PM  Result Value Ref Range   Troponin I (High Sensitivity) 24 (H) <18 ng/L    Comment: (NOTE) Elevated high sensitivity troponin I (hsTnI) values and significant  changes across serial measurements may suggest ACS but many other  chronic and acute conditions are known to elevate hsTnI results.  Refer to the "Links" section for chest pain algorithms and additional  guidance. Performed at [facility], [address] [place], [place] [address]   Lactic acid, plasma     Status: None   Collection Time: 02/18/23  1:14 PM  Result Value Ref Range   Lactic Acid, Venous 1.1 0.5 - 1.9 mmol/L    Comment: Performed at [facility], [address] [place], [place] [address]  SARS Coronavirus 2 by RT PCR (hospital order, performed in [facility] hospital lab) *cepheid single result test* Anterior Nasal Swab     Status: Abnormal   Collection Time: 02/18/23  2:29 PM   Specimen: Anterior Nasal Swab  Result Value Ref Range   SARS Coronavirus 2 by RT PCR  POSITIVE (A) NEGATIVE    Comment: (NOTE) SARS-CoV-2 target nucleic acids are DETECTED  SARS-CoV-2 RNA is generally detectable in upper  respiratory specimens  during the acute phase of infection.  Positive results are indicative  of the presence of the identified virus, but do not rule out bacterial infection or co-infection with other pathogens not detected by the test.  Clinical correlation with patient history and  other diagnostic information is necessary to determine patient infection status.  The expected result is negative.  Fact Sheet for Patients:   RoadLapTop.co.za   Fact Sheet for Healthcare Providers:   http://kim-miller.com/    This test is not yet approved or cleared by the [place] FDA and  has been authorized for detection and/or diagnosis of SARS-CoV-2 by FDA under an Emergency Use Authorization (EUA).  This EUA will remain in effect (meaning this test can be used) for the duration of  the COVID-19 declaration under Section 564(b)(1)  of the Act, 21 U.S.C. section 360-bbb-3(b)(1), unless the authorization is terminated or revoked sooner.   Performed at [facility], [address] [place], [place] [address]   Troponin I (High Sensitivity)     Status: Abnormal   Collection Time: 02/18/23  2:45 PM  Result Value Ref Range   Troponin I (High Sensitivity) 18 (H) <18 ng/L    Comment: (NOTE) Elevated high sensitivity troponin I (hsTnI) values and significant  changes across serial measurements may suggest ACS but many other  chronic and acute conditions are known to elevate hsTnI results.  Refer to the "Links" section for chest pain algorithms and additional  guidance. Performed at [facility], [address] [place], [place] [address]   Blood gas, venous     Status: Abnormal   Collection Time: 02/18/23  3:05 PM  Result Value Ref Range   pH, Ven 7.36 7.25 - 7.43   pCO2, Ven 43 (L) 44 - 60  mmHg   pO2, Ven 65 (H) 32 - 45 mmHg   Bicarbonate 24.3 20.0 - 28.0 mmol/L   Acid-base deficit 1.3 0.0 - 2.0 mmol/L   O2 Saturation 93.5 %   Patient temperature 37.0    Collection site VEIN     Comment: Performed at [facility], [address] [place], [place] [address]   DG Chest Portable 1 View  Result Date: 02/18/2023 CLINICAL DATA:  Shortness of breath. EXAM: PORTABLE CHEST 1 VIEW COMPARISON:  December 31, 2022. FINDINGS: Stable cardiomediastinal silhouette. Tracheostomy tube is in grossly good position. Mild bibasilar subsegmental atelectasis or infiltrates are noted, right greater than left. Small bilateral pleural effusions are noted. Bony thorax is unremarkable. IMPRESSION: Mild bibasilar subsegmental atelectasis or infiltrates are noted, right greater than left. Small bilateral pleural effusions. Electronically Signed   By: Lupita Raider M.D.   On: 02/18/2023 14:50    Pending Labs Unresulted Labs (From admission, onward)     Start     Ordered   02/19/23 0500  Basic metabolic panel  Tomorrow morning,   STAT        02/18/23 1431   02/19/23 0500  CBC  Tomorrow morning,   STAT        02/18/23 1431   02/19/23 0500  Hemoglobin A1c  Tomorrow morning,   R       Comments: To assess prior glycemic control    02/18/23 1436   02/18/23 1638  Urinalysis, Complete w Microscopic -Urine, Clean Catch  Once,   R       Question:  Specimen Source  Answer:  Urine, Clean Catch   02/18/23 1637   02/18/23 1535  Magnesium  Once,   STAT        02/18/23 1535   02/18/23 1535  Procalcitonin  Once,   STAT       References:    Procalcitonin Lower Respiratory Tract Infection AND Sepsis Procalcitonin Algorithm   02/18/23 1535   02/18/23 1528  MRSA Next Gen by PCR, Nasal  (MRSA Screening)  Add-on,   AD        02/18/23 1528   02/18/23 1502  Respiratory (~20 pathogens) panel by PCR  (Respiratory panel by PCR (~20 pathogens, ~24 hr TAT)  w precautions)  Once,   R        02/18/23 1501   02/18/23 1257   Culture, blood (routine x 2)  BLOOD CULTURE X 2,   STAT      02/18/23 1301   02/18/23 1257  Lactic acid, plasma  Now then every 2 hours,   STAT      02/18/23 1301            Vitals/Pain Today's Vitals   02/18/23 1330 02/18/23 1400 02/18/23 1420 02/18/23 1530  BP: (!) 151/66 129/68  (!) 142/72  Pulse: (!) 103 (!) 105  (!) 106  Resp: (!) 23 18  (!) 21  Temp:      TempSrc:      SpO2: 97% 97%  100%  Weight:      Height:      PainSc:   5      Isolation Precautions Airborne and Contact precautions  Medications Medications  vancomycin (VANCOREADY) IVPB 2000 mg/400 mL (2,000 mg Intravenous New Bag/Given 02/18/23 1515)  acetaminophen (TYLENOL) tablet 650 mg (has no administration in time range)    Or  acetaminophen (TYLENOL) suppository 650 mg (has no administration in time range)  ondansetron (ZOFRAN) tablet 4 mg (has no administration in time range)    Or  ondansetron (ZOFRAN) injection 4 mg (has no administration in time range)  senna-docusate (Senokot-S) tablet 1 tablet (has no administration in time range)  insulin aspart (novoLOG) injection 0-5 Units (has no administration in time range)  insulin aspart (novoLOG) injection 0-15 Units (has no administration in time range)  doxycycline (VIBRAMYCIN) 100 mg in sodium chloride 0.9 % 250 mL IVPB (has no administration in time range)  enoxaparin (LOVENOX) injection 50 mg (has no administration in time range)  morphine (PF) 2 MG/ML injection 2 mg (has no administration in time range)  fentaNYL (SUBLIMAZE) injection 25 mcg (has no administration in time range)  citric acid-potassium citrate (POLYCITRA) 10 mEq/5 ml solution 20 mEq (has no administration in time range)  ceFEPIme (MAXIPIME) 2 g in sodium chloride 0.9 % 100 mL IVPB (has no administration in time range)  vancomycin (VANCOREADY) IVPB 1250 mg/250 mL (has no administration in time range)  aspirin chewable tablet 81 mg (has no administration in time range)  amLODipine  (NORVASC) tablet 5 mg (has no administration in time range)  cloNIDine (CATAPRES - Dosed in mg/24 hr) patch 0.1 mg (has no administration in time range)  hydrOXYzine (VISTARIL) capsule 25 mg (has no administration in time range)  mirtazapine (REMERON) tablet 30 mg (has no administration in time range)  PARoxetine (PAXIL) tablet 40 mg (has no administration in time range)  clonazePAM (KLONOPIN) tablet 0.5 mg (has no administration in time range)  Cholecalciferol 50,000 Units (has no administration in time range)  Vitamin D (Ergocalciferol) (DRISDOL) 1.25 MG (50000 UNIT) capsule 50,000 Units (has no administration in time range)  azelastine (ASTELIN) 0.1 % nasal spray  2 spray (has no administration in time range)  guaiFENesin (MUCINEX) 12 hr tablet 600 mg (has no administration in time range)  ipratropium-albuterol (DUONEB) 0.5-2.5 (3) MG/3ML nebulizer solution 3 mL (has no administration in time range)  insulin glargine-yfgn (SEMGLEE) injection 5 Units (has no administration in time range)  hydrALAZINE (APRESOLINE) injection 5 mg (has no administration in time range)  methylPREDNISolone sodium succinate (SOLU-MEDROL) 40 mg/mL injection 40 mg (has no administration in time range)  ipratropium-albuterol (DUONEB) 0.5-2.5 (3) MG/3ML nebulizer solution 6 mL (6 mLs Nebulization Given 02/18/23 1325)  morphine (PF) 4 MG/ML injection 4 mg (4 mg Intravenous Given 02/18/23 1324)  ceFEPIme (MAXIPIME) 2 g in sodium chloride 0.9 % 100 mL IVPB (2 g Intravenous New Bag/Given 02/18/23 1425)  potassium chloride (KLOR-CON) packet 40 mEq (40 mEq Oral Given 02/18/23 1426)  oxyCODONE-acetaminophen (PERCOCET/ROXICET) 5-325 MG per tablet 1 tablet (1 tablet Oral Given 02/18/23 1506)  methylPREDNISolone sodium succinate (SOLU-MEDROL) 125 mg/2 mL injection 125 mg (125 mg Intravenous Given 02/18/23 1507)    Mobility Stretcher     Focused Assessments Cardiac Assessment Handoff:  Cardiac Rhythm: Sinus tachycardia Lab  Results  Component Value Date   CKTOTAL 73 07/09/2014   CKMB 3.4 07/09/2014   TROPONINI <0.03 10/19/2018   No results found for: "DDIMER" Does the Patient currently have chest pain? No   , Neuro Assessment Handoff:   Neuro Assessment:   Neuro Checks:       If patient is a Neuro Trauma and patient is going to OR before floor call report to 4N Charge nurse: [phone] or [phone]  , Renal Assessment Handoff: N/A  , Pulmonary Assessment Handoff:  Lung sounds:   O2 Device: Tracheostomy Collar O2 Flow Rate (L/min): 6 L/min    R Recommendations: See Admitting Provider Note  Report given to:   Additional Notes: N/A

## 2023-02-18 NOTE — Assessment & Plan Note (Addendum)
Check magnesium level on admission Polycitra 20 mEq p.o. one-time dose ordered

## 2023-02-18 NOTE — Assessment & Plan Note (Signed)
Consult placed to registered dietitian

## 2023-02-18 NOTE — Assessment & Plan Note (Signed)
Home amlodipine 5 mg daily and weekly clonidine patch resumed on admission hydralazine 5 mg IV every 6 hours as needed for SBP greater 175, 5 days ordered

## 2023-02-18 NOTE — H&P (Addendum)
History and Physical   Michelle [name] YQM:[id number] DOB: 1960/08/10 DOA: 02/18/2023  PCP: Abram Sander, MD  Outpatient Specialists: [facility] pulmonology; [facility] palliative Patient coming from: home via EMS  I have personally briefly reviewed patient's old medical records in [facility] EMR.  Chief Concern: cough, colored sputum  HPI: [name] is a 62 year old female with history of MRSA and Pseudomonas pneumonia, severe atypical pneumonia, COPD, on chronic trach with vent at home, history of cocaine abuse, congestive heart failure, hyperlipidemia, hypertension, history of tobacco use, history of left local axilla abscess, emphysema, coronary artery disease, anxiety, asthma, depression, insulin-dependent diabetes mellitus, history of morbid obesity, who presents to the emergency department for chief concerns of difficulty breathing, thick yellow mucus.  Per ED provider, patient had a Tmax of 100.8 at home and has taken Tylenol prior to ED presentation.  Vitals in the ED showed temperature of 100.2, respiration rate of 23, heart rate 101, blood pressure 143/78, SpO2 of 100% on tracheostomy.  Serum sodium is 132, potassium 3.2, chloride 96, bicarb 24, BUN of 18, serum creatinine of 0.70, EGFR greater than 60, nonfasting blood glucose 239, WBC 5.4, hemoglobin 11.7, platelets of 211.  ED treatment: Cefepime 2 g IV one-time dose, vancomycin 2 g IV one-time dose. ---------------------------- At bedside, patient is awake alert and oriented, is able to tell me her first name.  She knows she is in the hospital and she knows her daughter is at bedside.  Daughter at bedside provided primary H&P.  Daughter states that over the last week patient has been having difficulty breathing, increased effort and patient has been coughing yellow to dark yellow sputum for approximately 1 week.  Patient's daughter reports that patient has been afebrile except for today, with her temperature increased  to 100.8 and patient took a Tylenol at home.  Patient and daughter denies nausea and vomiting at at home.  Daughter reports that patient has had intermittent watery and now loose stools over the last few days.  Stool color has been brown, dark brown.  Patient and daughter denies blood in the stool and denies black melena colored stool.  Patient denies dysuria, hematuria however daughter endorses that patient has had increased urinary frequency over the last week.  Patient endorses chest pain with coughing and with breathing especially during inhalation.  Social history: She lives at home with her daughter.  She is a former tobacco user, former cocaine user.  Patient no longer smokes tobacco products or uses cocaine.  Patient denies EtOH use.  ROS: Unable to complete as patient is acutely ill.  ED Course: Discussed with emergency medicine provider, patient requiring hospitalization for chief concerns of sepsis, suspect secondary to community-acquired pneumonia.  Assessment/Plan  Principal Problem:   Sepsis (HCC) Active Problems:   Community acquired pneumonia   Chronic respiratory failure requiring continuous mechanical ventilation through tracheostomy (HCC)   Hypokalemia   Depression with anxiety   Hyperlipidemia associated with type 2 diabetes mellitus (HCC)   Tobacco abuse   Protein-calorie malnutrition, severe   Acute on chronic respiratory failure with hypoxia (HCC)   CAD (coronary artery disease)   HTN (hypertension)   DM (diabetes mellitus) (HCC)   Atypical pneumonia   Tracheostomy status (HCC)   Dysuria   Elevated troponin   Assessment and Plan:  * Sepsis (HCC) Blood cultures x 2 have been ordered, 1 is in process and 1 indicates it is collected at the time of this dictation. Continue cefepime and vancomycin given patient's  history of MRSA and Pseudomonas pneumonia Added doxycycline for atypical coverage 20 pathogen respiratory panel by PCR has been ordered Check  procalcitonin on admission COVID PCR is in process Admit to stepdown, inpatient  Hypokalemia Check magnesium level on admission Polycitra 20 mEq p.o. one-time dose ordered  Community acquired pneumonia Doxycycline 100 mg IV twice daily, cefepime and vancomycin per pharmacy Consult placed to respiratory care treatment  Depression with anxiety Home paroxetine 40 mg every morning, mirtazapine 30 mg nightly, hydroxyzine 25 mg p.o. every 6 hours as needed for anxiety, itching resumed on admission  Elevated troponin Secondary to pneumonia in setting of chronic tracheostomy causing demand ischemia Repeat high since he troponin is downtrending Patient reports the chest pain is with coughing and on breathing, low clinical suspicion for ACS at this time  Dysuria And polydipsia, present on admission Check a UA, continue antibiotics as appropriate  Tracheostomy status (HCC) Chronic tracheostomy and ventilation at home Consult placed to respiratory care treatment team Inpatient, stepdown admission  DM (diabetes mellitus) (HCC) Insulin-dependent diabetes mellitus Home long-acting insulin 10 units nightly not resumed on admission as patient is currently n.p.o. and has high risk of aspiration Semglee 5 units nightly one-time dose ordered on admission AM team to resume home long-acting insulin when the benefits outweigh the risk Insulin SSI with at bedtime coverage ordered Goal inpatient blood glucose level is 140-180  HTN (hypertension) Home amlodipine 5 mg daily and weekly clonidine patch resumed on admission hydralazine 5 mg IV every 6 hours as needed for SBP greater 175, 5 days ordered  CAD (coronary artery disease) Home aspirin 81 mg daily resumed  Acute on chronic respiratory failure with hypoxia (HCC) Presumed secondary to atypical/community-acquired pneumonia in setting of patient with history of MRSA and Pseudomonas pneumonia with chronic tracheostomy  Protein-calorie  malnutrition, severe Consult placed to registered dietitian  Tobacco abuse Patient no longer uses tobacco products  Chart reviewed.   Aspiration precautions  DVT prophylaxis: Enoxaparin Code Status: DNR/DNI, MOST form was at bedside and confirmed with daughter Diet: N.p.o. Family Communication: Updated with daughter at bedside, Michelle [name] with patient's permission Disposition Plan: Pending clinical course Consults called: RT, registered dietitian Admission status: Stepdown, inpatient  Past Medical History:  Diagnosis Date   Allergy    Anxiety    Asthma    CHF (congestive heart failure) (HCC)    Cocaine abuse (HCC)    COPD (chronic obstructive pulmonary disease) (HCC)    Coronary artery disease    Emphysema/COPD (HCC)    Hepatitis C    High cholesterol    Hypertension    Pneumothorax    Tobacco abuse    Past Surgical History:  Procedure Laterality Date   CARDIAC CATHETERIZATION     CHEST TUBE INSERTION     IR GASTROSTOMY TUBE MOD SED  10/31/2017   TRACHEOSTOMY TUBE PLACEMENT N/A 10/17/2017   Procedure: TRACHEOSTOMY;  Surgeon: Bud Face, MD;  Location: [facility] ORS;  Service: ENT;  Laterality: N/A;   Social History:  reports that she quit smoking about 5 years ago. Her smoking use included cigarettes. She started smoking about 46 years ago. She has a 4.1 pack-year smoking history. She has never used smokeless tobacco. She reports that she does not currently use drugs after having used the following drugs: "Crack" cocaine. She reports that she does not drink alcohol.  Allergies  Allergen Reactions   Amoxicillin     Patient has tolerated cephalosporins in the past   Ativan [Lorazepam]  Makes anxiety worse    Family History  Problem Relation Age of Onset   Lung cancer Mother    Asthma Mother    CVA Father    CAD Father    Stroke Father    Breast cancer Maternal Aunt 73   COPD Sister    Family history: Family history reviewed and not pertinent.  Prior to  Admission medications   Medication Sig Start Date End Date Taking? Authorizing Provider  albuterol (VENTOLIN HFA) 108 (90 Base) MCG/ACT inhaler Inhale 2 puffs into the lungs every 6 (six) hours as needed for wheezing or shortness of breath. 04/02/22  Yes Parrett, Tammy S, NP  amLODipine (NORVASC) 5 MG tablet Take 5 mg by mouth daily. 08/27/21  Yes [provider]  aspirin 81 MG chewable tablet Chew 81 mg by mouth daily.    Yes [provider]  azelastine (ASTELIN) 0.1 % nasal spray SMARTSIG:1-2 Spray(s) Both Nares Every 12 Hours PRN 09/22/20  Yes [provider]  cetirizine (ZYRTEC) 10 MG tablet Take 10 mg by mouth daily. 03/21/22  Yes [provider]  clonazePAM (KLONOPIN) 0.5 MG tablet Take 0.5 mg by mouth in the morning, at noon, and at bedtime.   Yes [provider]  cloNIDine (CATAPRES - DOSED IN MG/24 HR) 0.1 mg/24hr patch Place 0.1 mg onto the skin once a week.   Yes [provider]  D3-50 1.25 MG (50000 UT) capsule Take 50,000 Units by mouth once a week. 12/28/22  Yes [provider]  fluticasone (FLONASE) 50 MCG/ACT nasal spray Place 1 spray into both nostrils daily.    Yes [provider]  guaiFENesin (MUCINEX) 600 MG 12 hr tablet Take 600 mg by mouth 2 (two) times daily.   Yes [provider]  hydrOXYzine (VISTARIL) 25 MG capsule Take 25 mg by mouth every 6 (six) hours as needed. 10/01/22  Yes [provider]  ipratropium-albuterol (DUONEB) 0.5-2.5 (3) MG/3ML SOLN Take 3 mLs by nebulization every 6 (six) hours. Patient taking differently: Take 3 mLs by nebulization every 6 (six) hours as needed (for shortness of breath). 12/31/17  Yes Conforti, John, DO  LANTUS SOLOSTAR 100 UNIT/ML Solostar Pen Inject 10 Units into the skin at bedtime. 01/03/23  Yes [provider]  mirtazapine (REMERON) 30 MG tablet Take 30 mg by mouth at bedtime. 01/19/21  Yes [provider]  mupirocin ointment  (BACTROBAN) 2 % Apply 1 Application topically daily. To inside nose and to affected area of axilla 05/21/22  Yes Moye, [place], MD  naloxone [facility]) nasal spray 4 mg/0.1 mL Place 1 spray into the nose once. 10/22/22  Yes [provider]  NOVOLOG FLEXPEN 100 UNIT/ML FlexPen Inject into the skin 3 (three) times daily with meals. Sliding scale mealtime insulin. 03/01/21  Yes [provider]  nystatin ointment (MYCOSTATIN) Apply 1 Application topically 3 (three) times daily. 01/04/23  Yes [provider]  oxyCODONE-acetaminophen (PERCOCET/ROXICET) 5-325 MG tablet Take 1 tablet by mouth 3 (three) times daily as needed for pain. 09/07/21  Yes [provider]  PARoxetine (PAXIL) 40 MG tablet Take 40 mg by mouth every morning. Give 30 mg x 1 week (1.5 tab of 20 mg) then start 40 mg daily. D/c 20 mg pills at that time.   Yes [provider]  PULMICORT 0.5 MG/2ML nebulizer solution SMARTSIG:1 Via Nebulizer Twice Daily 02/13/21  Yes [provider]  terbinafine (LAMISIL) 1 % cream Apply 1 application topically 2 (two) times daily. To affected  skin, for 1 week after resolution of rash. # 30 gm, 1 refill.   Yes [provider]  TRULICITY 4.5 MG/0.5ML SOPN Inject 4.5 mg into the skin once a week. 09/28/21  Yes [provider]  Vitamin D, Ergocalciferol, (DRISDOL) 1.25 MG (50000 UNIT) CAPS capsule Take 50,000 Units by mouth 2 (two) times a week.   Yes [provider]  atorvastatin (LIPITOR) 40 MG tablet Take 40 mg by mouth at bedtime. Patient not taking: Reported on 09/30/2021 01/25/21   [provider]  ciprofloxacin (CIPRO) 750 MG tablet Take one tab po BID x 7 days. Patient not taking: Reported on 02/18/2023 05/31/22   Neale Burly, [place], MD  doxycycline (VIBRA-TABS) 100 MG tablet Take 1 tablet (100 mg total) by mouth 2 (two) times daily. Patient not taking: Reported on 02/18/2023 04/02/22   Parrett, Virgel Bouquet, NP  Ferrous Sulfate (IRON)  325 (65 Fe) MG TABS Take 1 tablet by mouth every other day. Patient not taking: Reported on 09/30/2021 01/25/21   [provider]  fluconazole (DIFLUCAN) 150 MG tablet Take 1 tablet day 1 repeat in 1 week Patient not taking: Reported on 09/30/2021 12/26/20   Deirdre Evener, MD  gabapentin (NEURONTIN) 100 MG capsule Take 100 mg by mouth 3 (three) times daily. One tablet in am and mid day, and 3 at bedtime. Patient not taking: Reported on 09/30/2021    [provider]  hydrOXYzine (ATARAX) 25 MG tablet Take 25 mg by mouth every 6 (six) hours as needed. Patient not taking: Reported on 02/18/2023    [provider]  insulin detemir (LEVEMIR) 100 UNIT/ML FlexPen Inject 14 Units into the skin at bedtime. Patient not taking: Reported on 02/18/2023    [provider]  polyethylene glycol powder (GLYCOLAX/MIRALAX) 17 GM/SCOOP powder Take by mouth. Patient not taking: Reported on 04/02/2022    [provider]  predniSONE (DELTASONE) 10 MG tablet Take 10 mg by mouth daily. Patient not taking: Reported on 02/18/2023 03/30/22   [provider]  promethazine (PHENERGAN) 12.5 MG tablet Take by mouth. Patient not taking: Reported on 04/02/2022 12/06/20   [provider]  fluconazole (DIFLUCAN) 150 MG tablet Take 1 tablet day 1 repeat in 1 week 11/15/20   Neale Burly, [place], MD   Physical Exam: Vitals:   02/18/23 1257 02/18/23 1330 02/18/23 1400 02/18/23 1530  BP:  (!) 151/66 129/68 (!) 142/72  Pulse:  (!) 103 (!) 105 (!) 106  Resp:  (!) 23 18 (!) 21  Temp:      TempSrc:      SpO2:  97% 97% 100%  Weight: (S) 95.7 kg     Height: 5' (1.524 m)      Constitutional: appears older than chronological age, acutely ill Eyes: PERRL, lids and conjunctivae normal ENMT: Mucous membranes are moist. Posterior pharynx clear of any exudate or lesions. Age-appropriate dentition. Hearing appropriate Neck: normal, supple, no masses, no thyromegaly Respiratory:  Decreased lung sound in the right lower lobe. Mild diffuse end expiratory wheezing, no crackles.  Increased respiratory effort.  Increased accessory muscle use.  Cardiovascular: Regular rate and rhythm, no murmurs / rubs / gallops. No extremity edema. 2+ pedal pulses. No carotid bruits.  Abdomen: no tenderness, no masses palpated, no hepatosplenomegaly. Bowel sounds positive.  Musculoskeletal: no clubbing / cyanosis. No joint deformity upper and lower extremities. Good ROM, no contractures, no atrophy. Normal muscle tone.  Skin: no rashes, lesions, ulcers. No induration Neurologic: Sensation intact. Strength 4/5 in all 4.  Psychiatric: Unable to assess judgment and insight. Alert and oriented x 3.  Depressed mood.  Flat affect  EKG: independently reviewed, showing sinus tachycardia with rate of 100, QTc 528  Chest x-ray on Admission: I personally reviewed and I agree with radiologist reading as below.  DG Chest Portable 1 View  Result Date: 02/18/2023 CLINICAL DATA:  Shortness of breath. EXAM: PORTABLE CHEST 1 VIEW COMPARISON:  December 31, 2022. FINDINGS: Stable cardiomediastinal silhouette. Tracheostomy tube is in grossly good position. Mild bibasilar subsegmental atelectasis or infiltrates are noted, right greater than left. Small bilateral pleural effusions are noted. Bony thorax is unremarkable. IMPRESSION: Mild bibasilar subsegmental atelectasis or infiltrates are noted, right greater than left. Small bilateral pleural effusions. Electronically Signed   By: Lupita Raider M.D.   On: 02/18/2023 14:50    Labs on Admission: I have personally reviewed following labs  CBC: Recent Labs  Lab 02/18/23 1310  WBC 5.4  NEUTROABS 4.5  HGB 11.7*  HCT 37.7  MCV 81.8  PLT 211   Basic Metabolic Panel: Recent Labs  Lab 02/18/23 1310  NA 132*  K 3.2*  CL 96*  CO2 24  GLUCOSE 239*  BUN 18  CREATININE 0.70  CALCIUM 8.3*   GFR: Estimated Creatinine Clearance: 76.5 mL/min (by C-G formula  based on SCr of 0.7 mg/dL).  Liver Function Tests: Recent Labs  Lab 02/18/23 1310  AST 22  ALT 15  ALKPHOS 75  BILITOT 0.8  PROT 8.1  ALBUMIN 3.6   Urine analysis:    Component Value Date/Time   COLORURINE STRAW (A) 09/30/2021 0552   APPEARANCEUR CLEAR (A) 09/30/2021 0552   APPEARANCEUR Clear 10/30/2013 1230   LABSPEC 1.015 09/30/2021 0552   LABSPEC 1.005 10/30/2013 1230   PHURINE 6.0 09/30/2021 0552   GLUCOSEU >=500 (A) 09/30/2021 0552   GLUCOSEU Negative 10/30/2013 1230   HGBUR NEGATIVE 09/30/2021 0552   BILIRUBINUR NEGATIVE 09/30/2021 0552   BILIRUBINUR Negative 10/30/2013 1230   KETONESUR 5 (A) 09/30/2021 0552   PROTEINUR NEGATIVE 09/30/2021 0552   NITRITE NEGATIVE 09/30/2021 0552   LEUKOCYTESUR NEGATIVE 09/30/2021 0552   LEUKOCYTESUR Negative 10/30/2013 1230   CRITICAL CARE Performed by: Dr. Sedalia Muta  Total critical care time: 32 minutes  Critical care time was exclusive of separately billable procedures and treating other patients.  Critical care was necessary to treat or prevent imminent or life-threatening deterioration.  Critical care was time spent personally by me on the following activities: development of treatment plan with patient and/or surrogate as well as nursing, discussions with consultants, evaluation of patient's response to treatment, examination of patient, obtaining history from patient or surrogate, ordering and performing treatments and interventions, ordering and review of laboratory studies, ordering and review of radiographic studies, pulse oximetry and re-evaluation of patient's condition.  This document was prepared using Dragon Voice Recognition software and may include unintentional dictation errors.  Dr. Sedalia Muta [facility]  If 7PM-7AM, please contact overnight-coverage provider If 7AM-7PM, please contact day attending provider [email or web address]  02/18/2023, 4:40 PM

## 2023-02-18 NOTE — Assessment & Plan Note (Addendum)
Chronic tracheostomy and ventilation at home Consult placed to respiratory care treatment team Inpatient, stepdown admission

## 2023-02-18 NOTE — ED Provider Notes (Signed)
[facility] Provider Note    Event Date/Time   First MD Initiated Contact with Patient 02/18/23 1252     (approximate)   History   Chief Complaint Shortness of Breath (Vent Pt)   HPI  Michelle [name] is a 62 y.o. female with past medical history of hypertension, diabetes, CAD, CHF, COPD, and chronic respiratory failure on home vent who presents to the ED complaining of shortness of breath.  Patient reports that she has been dealing with productive cough and significant yellow secretions when her tracheostomy has been suctioned for the past couple of days.  She reports feeling increasingly short of breath today with some discomfort in her chest and abdomen when she coughs.  She reports a fever as high as 100.8 earlier today, had a dose of Tylenol prior to arrival in the ED.  EMS reports wheezing noted on transport and she was given 1 DuoNeb prior to arrival.  Patient has reportedly been on steroids and antibiotics prescribed by her PCP.     Physical Exam   Triage Vital Signs: ED Triage Vitals [02/18/23 1250]  Encounter Vitals Group     BP      Systolic BP Percentile      Diastolic BP Percentile      Pulse      Resp      Temp      Temp src      SpO2 100 %     Weight      Height      Head Circumference      Peak Flow      Pain Score      Pain Loc      Pain Education      Exclude from Growth Chart     Most recent vital signs: Vitals:   02/18/23 1330 02/18/23 1400  BP: (!) 151/66 129/68  Pulse: (!) 103 (!) 105  Resp: (!) 23 18  Temp:    SpO2: 97% 97%    Constitutional: Alert and oriented. Eyes: Conjunctivae are normal. Head: Atraumatic. Nose: No congestion/rhinnorhea. Neck: Tracheostomy in place. Mouth/Throat: Mucous membranes are moist.  Cardiovascular: Tachycardic, regular rhythm. Grossly normal heart sounds.  2+ radial pulses bilaterally. Respiratory: Tachypneic with increased respiratory effort, lungs with poor air movement and  wheezing throughout. Gastrointestinal: Soft and nontender. No distention. Musculoskeletal: No lower extremity tenderness nor edema.  Neurologic:  Normal speech and language. No gross focal neurologic deficits are appreciated.    ED Results / Procedures / Treatments   Labs (all labs ordered are listed, but only abnormal results are displayed) Labs Reviewed  CBC WITH DIFFERENTIAL/PLATELET - Abnormal; Notable for the following components:      Result Value   Hemoglobin 11.7 (*)    MCH 25.4 (*)    Lymphs Abs 0.6 (*)    All other components within normal limits  COMPREHENSIVE METABOLIC PANEL - Abnormal; Notable for the following components:   Sodium 132 (*)    Potassium 3.2 (*)    Chloride 96 (*)    Glucose, Bld 239 (*)    Calcium 8.3 (*)    All other components within normal limits  TROPONIN I (HIGH SENSITIVITY) - Abnormal; Notable for the following components:   Troponin I (High Sensitivity) 24 (*)    All other components within normal limits  CULTURE, BLOOD (ROUTINE X 2)  CULTURE, BLOOD (ROUTINE X 2)  SARS CORONAVIRUS 2 BY RT PCR  LACTIC ACID, PLASMA  BLOOD GAS,  VENOUS  LACTIC ACID, PLASMA  TROPONIN I (HIGH SENSITIVITY)     EKG  ED ECG REPORT I, Chesley Noon, the attending physician, personally viewed and interpreted this ECG.   Date: 02/18/2023  EKG Time: 12:51  Rate: 100  Rhythm: normal sinus rhythm  Axis: Normal  Intervals:right bundle branch block and left posterior fascicular block  ST&T Change: None  RADIOLOGY Chest x-ray reviewed and interpreted by me with right lower lobe infiltrate.  PROCEDURES:  Critical Care performed: Yes, see critical care procedure note(s)  .Critical Care  Performed by: Chesley Noon, MD Authorized by: Chesley Noon, MD   Critical care provider statement:    Critical care time (minutes):  30   Critical care time was exclusive of:  Separately billable procedures and treating other patients and teaching time   Critical  care was necessary to treat or prevent imminent or life-threatening deterioration of the following conditions:  Respiratory failure and sepsis   Critical care was time spent personally by me on the following activities:  Development of treatment plan with patient or surrogate, discussions with consultants, evaluation of patient's response to treatment, examination of patient, ordering and review of laboratory studies, ordering and review of radiographic studies, ordering and performing treatments and interventions, pulse oximetry, re-evaluation of patient's condition and review of old charts   I assumed direction of critical care for this patient from another provider in my specialty: no     Care discussed with: admitting provider      MEDICATIONS ORDERED IN ED: Medications  ceFEPIme (MAXIPIME) 2 g in sodium chloride 0.9 % 100 mL IVPB (2 g Intravenous New Bag/Given 02/18/23 1425)  vancomycin (VANCOREADY) IVPB 2000 mg/400 mL (has no administration in time range)  oxyCODONE-acetaminophen (PERCOCET/ROXICET) 5-325 MG per tablet 1 tablet (has no administration in time range)  methylPREDNISolone sodium succinate (SOLU-MEDROL) 125 mg/2 mL injection 125 mg (has no administration in time range)  ipratropium-albuterol (DUONEB) 0.5-2.5 (3) MG/3ML nebulizer solution 6 mL (6 mLs Nebulization Given 02/18/23 1325)  morphine (PF) 4 MG/ML injection 4 mg (4 mg Intravenous Given 02/18/23 1324)  potassium chloride (KLOR-CON) packet 40 mEq (40 mEq Oral Given 02/18/23 1426)     IMPRESSION / MDM / ASSESSMENT AND PLAN / ED COURSE  I reviewed the triage vital signs and the nursing notes.                              62 y.o. female with past medical history of hypertension, diabetes, CAD, CHF, COPD, and chronic respiratory failure on home vent who presents to the ED with cough, significant yellow secretions from her tracheostomy, and increasing difficulty breathing over the past couple of days.  Patient's presentation  is most consistent with acute presentation with potential threat to life or bodily function.  Differential diagnosis includes, but is not limited to, sepsis, pneumonia, COPD exacerbation, ACS, PE, pneumothorax, anemia, electrolyte abnormality, AKI, viral syndrome.  Patient chronically ill-appearing but in no acute distress, vital signs remarkable for borderline fever at 100.2 along with tachycardia and tachypnea.  She is maintaining oxygen saturations at 100% on her vent, will transition to hospital vent to allow for any adjustments needed.  We will initiate sepsis workup, start on broad-spectrum antibiotics for suspected ventilator associated pneumonia.  Labs and chest x-ray are pending at this time, EKG shows no evidence of arrhythmia or ischemia.  Labs without significant anemia or leukocytosis, no significant electrolyte abnormality or AKI noted and  LFTs are unremarkable.  Troponin mildly elevated and we will trend but low suspicion for ACS or PE at this time, lactic acid within normal limits.  Chest x-ray concerning for right lower lobe infiltrate, patient receiving treatment for ventilator associated pneumonia and case discussed with hospitalist for admission.       FINAL CLINICAL IMPRESSION(S) / ED DIAGNOSES   Final diagnoses:  Sepsis, due to unspecified organism, unspecified whether acute organ dysfunction present (HCC)  Ventilator associated pneumonia (HCC)     Rx / DC Orders   ED Discharge Orders     None        Note:  This document was prepared using Dragon voice recognition software and may include unintentional dictation errors.   Chesley Noon, MD 02/18/23 [phone]

## 2023-02-18 NOTE — Progress Notes (Signed)
PHARMACIST - PHYSICIAN COMMUNICATION  CONCERNING:  Enoxaparin (Lovenox) for DVT Prophylaxis    RECOMMENDATION: Patient was prescribed enoxaprin 40mg  q24 hours for VTE prophylaxis.   Filed Weights   02/18/23 1257  Weight: (S) 95.7 kg (211 lb)    Body mass index is 41.21 kg/m.  Estimated Creatinine Clearance: 76.5 mL/min (by C-G formula based on SCr of 0.7 mg/dL).   Based on [facility] policy patient is candidate for enoxaparin 0.5mg /kg TBW SQ every 24 hours based on BMI being >30.  DESCRIPTION: Pharmacy has adjusted enoxaparin dose per [facility] policy.  Patient is now receiving enoxaparin 0.5 mg/kg every 24 hours    Lowella Bandy, PharmD Clinical Pharmacist  02/18/2023 2:41 PM

## 2023-02-18 NOTE — Consult Note (Signed)
PHARMACY - BRIEF ANTIBIOTIC NOTE   Pharmacy has received consult(s) for cefepime and vancomycin from an ED provider. The patient's profile has been reviewed for ht/wt/allergies/indication/available labs.    One time order(s) placed for: cefepime 2 g and vancomycin 2000 mg  Further antibiotics/pharmacy consults should be ordered by admitting physician if indicated.                       Thank you,  Will M. Dareen Piano, PharmD Clinical Pharmacist 02/18/2023 1:47 PM

## 2023-02-18 NOTE — Assessment & Plan Note (Signed)
Home aspirin 81 mg daily resumed

## 2023-02-18 NOTE — Assessment & Plan Note (Signed)
And polydipsia, present on admission Check a UA, continue antibiotics as appropriate

## 2023-02-18 NOTE — Assessment & Plan Note (Addendum)
Airborne and contact precaution Status post Solu-Medrol 125 mg IV one-time dose per EDP, Solu-Medrol 40 mg IV daily, continue for 2 doses starting on 02/19/2023

## 2023-02-18 NOTE — Assessment & Plan Note (Addendum)
Blood cultures x 2 have been ordered, 1 is in process and 1 indicates it is collected at the time of this dictation. Continue cefepime and vancomycin given patient's history of MRSA and Pseudomonas pneumonia Added doxycycline for atypical coverage 20 pathogen respiratory panel by PCR has been ordered Check procalcitonin on admission COVID PCR is in process Admit to stepdown, inpatient

## 2023-02-18 NOTE — Assessment & Plan Note (Addendum)
Insulin-dependent diabetes mellitus Home long-acting insulin 10 units nightly not resumed on admission as patient is currently n.p.o. and has high risk of aspiration Semglee 5 units nightly one-time dose ordered on admission AM team to resume home long-acting insulin when the benefits outweigh the risk Insulin SSI with at bedtime coverage ordered Goal inpatient blood glucose level is 140-180

## 2023-02-18 NOTE — Sepsis Progress Note (Signed)
Elink monitoring for the code sepsis protocol.  

## 2023-02-18 NOTE — Assessment & Plan Note (Signed)
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

## 2023-02-18 NOTE — Consult Note (Signed)
Pharmacy Antibiotic Note  ASSESSMENT: 62 y.o. 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 pneumoniae, and PsA pneumonia is presenting with pneumonia. Patient also found to be testing COVID(+) by PCR. Currently she is tachycardic and tachypneic, without leukocytosis.  Looking at prior culture results: 06/2019 and 08/2019: PsA and S. marcescens in trach aspirate 11/15/2020 and 12/01/2020: GBS and MRSA (doxy-S) in nasal abscess 05/2022: K. pneumo (FQ-R) in nasal abscess  Difficult to pin down her history of MRSA pneumonia but if it is being judged based on past positive MRSA nares PCRs, please note that this test has a poor positive predictive value.  Pharmacy has been consulted to manage cefepime and vancomycin dosing, and patient received a one-time vancomycin load of 2000 mg in the ED already.  Patient measurements: Height: 5' (152.4 cm) Weight: (S) 95.7 kg (211 lb) (BEd weight) IBW/kg (Calculated) : 45.5  Vital signs: Temp: 100.2 F (37.9 C) (09/16 1255) Temp Source: Oral (09/16 1255) BP: 129/68 (09/16 1400) Pulse Rate: 105 (09/16 1400) Recent Labs  Lab 02/18/23 1310  WBC 5.4  CREATININE 0.70   Estimated Creatinine Clearance: 76.5 mL/min (by C-G formula based on SCr of 0.7 mg/dL).  Allergies: Allergies  Allergen Reactions   Amoxicillin     Patient has tolerated cephalosporins in the past   Ativan [Lorazepam]     Makes anxiety worse     Antimicrobials this admission: Cefepime 9/16 >>  Vancomycin 9/16 >> Doxycycline 9/16 >>  Dose adjustments this admission: N/A  Microbiology results: 9/16 BCx: sent 9/16 COVID PCR: positive  9/16 MRSA PCR: ordered **Please see assessment section above for more microbiological data taken from chart**  PLAN: Initiate cefepime 2 g IV q8H Initiate vancomycin 1250 mg IV q24H starting 24 hrs from loading dose eAUC 539, Cmax 38, Cmin 13 Scr 0.8, IBW, Vd 0.5 L/kg Follow up  culture results to assess for antibiotic optimization. Monitor renal function to assess for any necessary antibiotic dosing changes.   Thank you for allowing pharmacy to be a part of this patient's care.  Will M. Dareen Piano, PharmD Clinical Pharmacist 02/18/2023 3:21 PM

## 2023-02-19 DIAGNOSIS — R6520 Severe sepsis without septic shock: Secondary | ICD-10-CM | POA: Diagnosis not present

## 2023-02-19 DIAGNOSIS — E669 Obesity, unspecified: Secondary | ICD-10-CM | POA: Insufficient documentation

## 2023-02-19 DIAGNOSIS — A419 Sepsis, unspecified organism: Secondary | ICD-10-CM | POA: Diagnosis not present

## 2023-02-19 LAB — URINALYSIS, COMPLETE (UACMP) WITH MICROSCOPIC
Bacteria, UA: NONE SEEN
Bilirubin Urine: NEGATIVE
Glucose, UA: >=500 mg/dL — AB
Hgb urine dipstick: NEGATIVE
Ketones, ur: NEGATIVE mg/dL
Leukocytes,Ua: NEGATIVE
Nitrite: NEGATIVE
Protein, ur: 100 mg/dL — AB
Specific Gravity, Urine: 1.023 (ref 1.005–1.030)
pH: 6 (ref 5.0–8.0)

## 2023-02-19 LAB — RESPIRATORY PANEL BY PCR

## 2023-02-19 LAB — CBC
HCT: 39.7 % (ref 36.0–46.0)
Hemoglobin: 12.5 g/dL (ref 12.0–15.0)
MCH: 25.4 pg — ABNORMAL LOW (ref 26.0–34.0)
MCHC: 31.5 g/dL (ref 30.0–36.0)
MCV: 80.5 fL (ref 80.0–100.0)
Platelets: 237 10*3/uL (ref 150–400)
RBC: 4.93 MIL/uL (ref 3.87–5.11)
RDW: 13.4 % (ref 11.5–15.5)
WBC: 4.2 10*3/uL (ref 4.0–10.5)
nRBC: 0 % (ref 0.0–0.2)

## 2023-02-19 LAB — BASIC METABOLIC PANEL
Anion gap: 9 (ref 5–15)
BUN: 22 mg/dL (ref 8–23)
CO2: 22 mmol/L (ref 22–32)
Calcium: 8.6 mg/dL — ABNORMAL LOW (ref 8.9–10.3)
Chloride: 103 mmol/L (ref 98–111)
Creatinine, Ser: 0.66 mg/dL (ref 0.44–1.00)
GFR, Estimated: >60 mL/min (ref >60)
Glucose, Bld: 250 mg/dL — ABNORMAL HIGH (ref 70–99)
Potassium: 4.6 mmol/L (ref 3.5–5.1)
Sodium: 134 mmol/L — ABNORMAL LOW (ref 135–145)

## 2023-02-19 LAB — GLUCOSE, CAPILLARY
Glucose-Capillary: 354 mg/dL — ABNORMAL HIGH (ref 70–99)
Glucose-Capillary: 239 mg/dL — ABNORMAL HIGH (ref 70–99)
Glucose-Capillary: 237 mg/dL — ABNORMAL HIGH (ref 70–99)
Glucose-Capillary: 216 mg/dL — ABNORMAL HIGH (ref 70–99)
Glucose-Capillary: 146 mg/dL — ABNORMAL HIGH (ref 70–99)
Glucose-Capillary: 281 mg/dL — ABNORMAL HIGH (ref 70–99)
Glucose-Capillary: 356 mg/dL — ABNORMAL HIGH (ref 70–99)

## 2023-02-19 LAB — HEMOGLOBIN A1C
Hgb A1c MFr Bld: 10.3 % — ABNORMAL HIGH (ref 4.8–5.6)
Mean Plasma Glucose: 249 mg/dL

## 2023-02-19 MED ORDER — ENOXAPARIN SODIUM 40 MG/0.4ML IJ SOSY
40.0000 mg | PREFILLED_SYRINGE | INTRAMUSCULAR | Status: DC
  Administered 2023-02-19 – 2023-02-21 (×3): 40 mg via SUBCUTANEOUS
  Filled 2023-02-19 (×3): qty 0.4

## 2023-02-19 MED ORDER — LABETALOL HCL 5 MG/ML IV SOLN
INTRAVENOUS | Status: AC
  Filled 2023-02-19: qty 4

## 2023-02-19 MED ORDER — ADULT MULTIVITAMIN W/MINERALS CH
1.0000 | ORAL_TABLET | Freq: Every day | ORAL | Status: DC
  Administered 2023-02-21 – 2023-02-22 (×2): 1 via ORAL
  Filled 2023-02-19 (×3): qty 1

## 2023-02-19 MED ORDER — CLONAZEPAM 0.25 MG PO TBDP
0.2500 mg | ORAL_TABLET | Freq: Two times a day (BID) | ORAL | Status: DC | PRN
  Administered 2023-02-19 – 2023-02-20 (×3): 0.25 mg via ORAL
  Filled 2023-02-19 (×3): qty 1

## 2023-02-19 MED ORDER — ENSURE MAX PROTEIN PO LIQD
11.0000 [oz_av] | Freq: Two times a day (BID) | ORAL | Status: DC
  Administered 2023-02-19 – 2023-02-21 (×2): 11 [oz_av] via ORAL
  Filled 2023-02-19: qty 330

## 2023-02-19 MED ORDER — OXYCODONE-ACETAMINOPHEN 5-325 MG PO TABS
1.0000 | ORAL_TABLET | Freq: Three times a day (TID) | ORAL | Status: DC | PRN
  Administered 2023-02-19 – 2023-02-21 (×5): 1 via ORAL
  Filled 2023-02-19 (×5): qty 1

## 2023-02-19 NOTE — Progress Notes (Signed)
PROGRESS NOTE    Michelle [name]  FIE:[id number] DOB: 08/23/1960 DOA: 02/18/2023 PCP: Abram Sander, MD     Brief Narrative:   From admission h and p  [name] is a 62 year old female with history of MRSA and Pseudomonas pneumonia, severe atypical pneumonia, COPD, on chronic trach with vent at home, history of cocaine abuse, congestive heart failure, hyperlipidemia, hypertension, history of tobacco use, history of left local axilla abscess, emphysema, coronary artery disease, anxiety, asthma, depression, insulin-dependent diabetes mellitus, history of morbid obesity, who presents to the emergency department for chief concerns of difficulty breathing, thick yellow mucus.   Per ED provider, patient had a Tmax of 100.8 at home and has taken Tylenol prior to ED presentation.   Vitals in the ED showed temperature of 100.2, respiration rate of 23, heart rate 101, blood pressure 143/78, SpO2 of 100% on tracheostomy.   At bedside, patient is awake alert and oriented, is able to tell me her first name.  She knows she is in the hospital and she knows her daughter is at bedside.   Daughter at bedside provided primary H&P.   Daughter states that over the last week patient has been having difficulty breathing, increased effort and patient has been coughing yellow to dark yellow sputum for approximately 1 week.   Patient's daughter reports that patient has been afebrile except for today, with her temperature increased to 100.8 and patient took a Tylenol at home.   Patient and daughter denies nausea and vomiting at at home.  Daughter reports that patient has had intermittent watery and now loose stools over the last few days.  Stool color has been brown, dark brown.  Patient and daughter denies blood in the stool and denies black melena colored stool.   Patient denies dysuria, hematuria however daughter endorses that patient has had increased urinary frequency over the last week.   Patient  endorses chest pain with coughing and with breathing especially during inhalation.  Assessment & Plan:   Principal Problem:   Sepsis (HCC) Active Problems:   Community acquired pneumonia   Chronic respiratory failure requiring continuous mechanical ventilation through tracheostomy (HCC)   Hypokalemia   Depression with anxiety   Hyperlipidemia associated with type 2 diabetes mellitus (HCC)   COPD (chronic obstructive pulmonary disease) (HCC)   Tobacco abuse   Protein-calorie malnutrition, severe   Acute on chronic respiratory failure with hypoxia (HCC)   CAD (coronary artery disease)   HTN (hypertension)   Chronic diastolic CHF (congestive heart failure) (HCC)   DM (diabetes mellitus) (HCC)   Atypical pneumonia   Tracheostomy status (HCC)   Dysuria   Elevated troponin   COVID-19 virus infection   Obesity (BMI 30-39.9)  Covid pneumonia With copd exacerbation - continue methylpred today, would plan on transition to oral prednisone tomorrow if stable  Sepsis (HCC) 2/2 covid. By fever and tachycardia.  - f/u cultures  COPD with exacerbation Covid is precipitator. Cxr w/ bibasilar atelectasis or infiltrate, favor the former. Mrsa neg - steroids as above - will continue cefepime, stop doxy/vanc - f/u tracheal aspirate culture, blood cultures   Depression with anxiety Home paroxetine 40 mg every morning, mirtazapine 30 mg nightly, hydroxyzine 25 mg p.o. 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 No answer when daughter Raoul Pitch called. Message left.   Level of care: Stepdown Status is: Inpatient Remains inpatient appropriate because: severity of illness    Consultants:  none  Procedures: none  Antimicrobials:  cefepime    Subjective: Reports breathing improved  Objective: Vitals:   02/19/23 0753 02/19/23 0755 02/19/23 0800 02/19/23 0835  BP:   128/72 127/73  Pulse:   65   Resp:   15   Temp:    98.6 F (37 C)  TempSrc:      SpO2: 100% 100% 100%   Weight:      Height:        Intake/Output Summary (Last 24 hours) at 02/19/2023 1027 Last data filed at 02/19/2023 0800 Gross per 24 hour  Intake 1159.49 ml  Output 250 ml  Net 909.49 ml   Filed Weights   02/18/23 1257 02/18/23 1749  Weight: (S) 95.7 kg 72.8 kg    Examination:  General exam: Appears calm and comfortable, chronically ill appearing Respiratory system: rales at bases, scattered rhonchi. trach Cardiovascular system: S1 & S2 heard, RRR. No JVD, murmurs, rubs, gallops or clicks.   Gastrointestinal system: Abdomen is obese, soft and nontender. No organomegaly or masses felt. Normal bowel sounds heard. Central nervous system: Alert and oriented. No focal neurological deficits. Extremities: decreased muscle tone lower extremities, trace LE edema Skin: No rashes, lesions or ulcers Psychiatry: Judgement and insight appear normal. Mood & affect appropriate.     Data Reviewed: I have personally reviewed following labs and imaging studies  CBC: Recent Labs  Lab 02/18/23 1310 02/19/23 0433  WBC 5.4 4.2  NEUTROABS 4.5  --   HGB 11.7* 12.5  HCT 37.7 39.7  MCV 81.8 80.5  PLT 211 237   Basic Metabolic Panel: Recent Labs  Lab 02/18/23 1310 02/18/23 2019 02/19/23 0433  NA 132*  --  134*  K 3.2*  --  4.6  CL 96*  --  103  CO2 24  --  22  GLUCOSE 239*  --  250*  BUN 18  --   22  CREATININE 0.70  --  0.66  CALCIUM 8.3*  --  8.6*  MG  --  1.9  --    GFR: Estimated Creatinine Clearance: 67.4 mL/min (by C-G formula based on SCr of 0.66 mg/dL). Liver Function Tests: Recent Labs  Lab 02/18/23 1310  AST 22  ALT 15  ALKPHOS 75  BILITOT 0.8  PROT 8.1  ALBUMIN 3.6   No results for input(s): "LIPASE", "AMYLASE" in the last 168 hours. No results for input(s): "AMMONIA" in the last 168 hours. Coagulation Profile: No results for input(s): "INR", "PROTIME" in the last 168 hours. Cardiac Enzymes: No results for input(s): "CKTOTAL", "CKMB", "CKMBINDEX", "TROPONINI" in the last 168 hours. BNP (last 3 results) No results for input(s): "PROBNP" in the last 8760 hours. HbA1C: No results for input(s): "HGBA1C" in the last 72 hours. CBG: Recent Labs  Lab 02/18/23 1936 02/18/23 2108 02/19/23 0111 02/19/23 0409 02/19/23 0738  GLUCAP 364* 339* 239* 237* 216*   Lipid Profile: No results for input(s): "CHOL", "HDL", "LDLCALC", "TRIG", "CHOLHDL", "LDLDIRECT" in the last 72 hours. Thyroid Function Tests: No  results for input(s): "TSH", "T4TOTAL", "FREET4", "T3FREE", "THYROIDAB" in the last 72 hours. Anemia Panel: No results for input(s): "VITAMINB12", "FOLATE", "FERRITIN", "TIBC", "IRON", "RETICCTPCT" in the last 72 hours. Urine analysis:    Component Value Date/Time   COLORURINE YELLOW (A) 02/19/2023 0546   APPEARANCEUR CLEAR (A) 02/19/2023 0546   APPEARANCEUR Clear 10/30/2013 1230   LABSPEC 1.023 02/19/2023 0546   LABSPEC 1.005 10/30/2013 1230   PHURINE 6.0 02/19/2023 0546   GLUCOSEU >=500 (A) 02/19/2023 0546   GLUCOSEU Negative 10/30/2013 1230   HGBUR NEGATIVE 02/19/2023 0546   BILIRUBINUR NEGATIVE 02/19/2023 0546   BILIRUBINUR Negative 10/30/2013 1230   KETONESUR NEGATIVE 02/19/2023 0546   PROTEINUR 100 (A) 02/19/2023 0546   NITRITE NEGATIVE 02/19/2023 0546   LEUKOCYTESUR NEGATIVE 02/19/2023 0546   LEUKOCYTESUR Negative 10/30/2013 1230   Sepsis  Labs: @LABRCNTIP (procalcitonin:4,lacticidven:4)  ) Recent Results (from the past 240 hour(s))  Culture, blood (routine x 2)     Status: None (Preliminary result)   Collection Time: 02/18/23  1:01 PM   Specimen: BLOOD  Result Value Ref Range Status   Specimen Description BLOOD BLOOD RIGHT ARM  Final   Special Requests   Final    BOTTLES DRAWN AEROBIC AND ANAEROBIC Blood Culture adequate volume   Culture   Final    NO GROWTH < 24 HOURS Performed at [facility], [address], [place], [place] [address]    Report Status PENDING  Incomplete  SARS Coronavirus 2 by RT PCR (hospital order, performed in [facility] hospital lab) *cepheid single result test* Anterior Nasal Swab     Status: Abnormal   Collection Time: 02/18/23  2:29 PM   Specimen: Anterior Nasal Swab  Result Value Ref Range Status   SARS Coronavirus 2 by RT PCR POSITIVE (A) NEGATIVE Final    Comment: (NOTE) SARS-CoV-2 target nucleic acids are DETECTED  SARS-CoV-2 RNA is generally detectable in upper respiratory specimens  during the acute phase of infection.  Positive results are indicative  of the presence of the identified virus, but do not rule out bacterial infection or co-infection with other pathogens not detected by the test.  Clinical correlation with patient history and  other diagnostic information is necessary to determine patient infection status.  The expected result is negative.  Fact Sheet for Patients:   RoadLapTop.co.za   Fact Sheet for Healthcare Providers:   http://kim-miller.com/    This test is not yet approved or cleared by the [place] FDA and  has been authorized for detection and/or diagnosis of SARS-CoV-2 by FDA under an Emergency Use Authorization (EUA).  This EUA will remain in effect (meaning this test can be used) for the duration of  the COVID-19 declaration under Section 564(b)(1)  of the Act, 21 U.S.C. section  360-bbb-3(b)(1), unless the authorization is terminated or revoked sooner.   Performed at [facility], [address] [place], [place] [address]   Culture, blood (routine x 2)     Status: None (Preliminary result)   Collection Time: 02/18/23  3:16 PM   Specimen: BLOOD  Result Value Ref Range Status   Specimen Description BLOOD BLOOD LEFT HAND  Final   Special Requests   Final    BOTTLES DRAWN AEROBIC AND ANAEROBIC Blood Culture adequate volume   Culture   Final    NO GROWTH < 24 HOURS Performed at [facility], [address] [place], [place] [address]    Report Status PENDING  Incomplete  MRSA Next Gen by PCR,  Nasal     Status: None   Collection Time: 02/18/23  5:59 PM   Specimen: Nasal Mucosa; Nasal Swab  Result Value Ref Range Status   MRSA by PCR Next Gen NOT DETECTED NOT DETECTED Final    Comment: (NOTE) The GeneXpert MRSA Assay (FDA approved for NASAL specimens only), is one component of a comprehensive MRSA colonization surveillance program. It is not intended to diagnose MRSA infection nor to guide or monitor treatment for MRSA infections. Test performance is not FDA approved in patients less than 37 years old. Performed at [facility], [address] [place], [place] [address]          Radiology Studies: DG Chest Portable 1 View  Result Date: 02/18/2023 CLINICAL DATA:  Shortness of breath. EXAM: PORTABLE CHEST 1 VIEW COMPARISON:  December 31, 2022. FINDINGS: Stable cardiomediastinal silhouette. Tracheostomy tube is in grossly good position. Mild bibasilar subsegmental atelectasis or infiltrates are noted, right greater than left. Small bilateral pleural effusions are noted. Bony thorax is unremarkable. IMPRESSION: Mild bibasilar subsegmental atelectasis or infiltrates are noted, right greater than left. Small bilateral pleural effusions. Electronically Signed   By: Lupita Raider M.D.   On: 02/18/2023 14:50        Scheduled  Meds:  amLODipine  5 mg Oral Daily   aspirin  81 mg Oral Daily   Chlorhexidine Gluconate Cloth  6 each Topical Daily   clonazePAM  0.5 mg Oral TID   cloNIDine  0.1 mg Transdermal Weekly   enoxaparin (LOVENOX) injection  50 mg Subcutaneous Q24H   guaiFENesin  600 mg Oral BID   insulin aspart  0-15 Units Subcutaneous TID WC   insulin aspart  0-5 Units Subcutaneous QHS   methylPREDNISolone (SOLU-MEDROL) injection  40 mg Intravenous Daily   mirtazapine  30 mg Oral QHS   mouth rinse  15 mL Mouth Rinse 4 times per day   PARoxetine  40 mg Oral Daily   Vitamin D (Ergocalciferol)  50,000 Units Oral Weekly   Continuous Infusions:  sodium chloride Stopped (02/19/23 0610)   ceFEPime (MAXIPIME) IV Stopped (02/19/23 6578)     LOS: 1 day     Silvano Bilis, MD [facility]   If 7PM-7AM, please contact night-coverage [email or web address] Password [facility] 02/19/2023, 10:27 AM

## 2023-02-19 NOTE — Progress Notes (Signed)
Initial Nutrition Assessment  DOCUMENTATION CODES:   Obesity unspecified  INTERVENTION:   Ensure Max protein supplement po BID, each supplement provides 150kcal and 30g of protein.  MVI po daily   Daily weights   NUTRITION DIAGNOSIS:   Increased nutrient needs related to acute illness as evidenced by estimated needs.  GOAL:   Patient will meet greater than or equal to 90% of their needs  MONITOR:   PO intake, Supplement acceptance, Labs, Weight trends, I & O's, Skin  REASON FOR ASSESSMENT:   Consult Assessment of nutrition requirement/status  ASSESSMENT:   61 year old female with PMHx of COPD, emphysema, HTN, CAD, hx cocaine use, asthma, DM, HLD, depression, anxiety, CHF, anxiety, hepatitis C, hx tracheostomy tube placement on 10/17/2017 on home vent, hx G-tube placement 10/31/2017 (now removed) who is admitted CAP and sepsis.  Met with pt in room today. Pt is well known to this RD from previous admissions. Pt reports good appetite and oral at baseline. Pt initiated on an oral diet today. RD discussed with pt the importance of adequate nutrition needed to preserve lean muscle. Pt is willing to drink chocolate or strawberry Ensure in hospital. RD will add supplements to help pt meet her estimated needs. Per chart, pt appears weight stable at baseline.   Medications reviewed and include: aspirin, lovenox, insulin, solu-medrol, remeron, paxil, vitamin D, cefepime  Labs reviewed: Na 134(L), K 4.6 wnl Mg 1.9 wnl- 9/16 Cbgs- 146, 216, 237, 239 x 24 hrs  AIC 10.2(H)- 09/2021  NUTRITION - FOCUSED PHYSICAL EXAM:  Flowsheet Row Most Recent Value  Orbital Region No depletion  Upper Arm Region No depletion  Thoracic and Lumbar Region No depletion  Buccal Region No depletion  Temple Region No depletion  Clavicle Bone Region No depletion  Clavicle and Acromion Bone Region No depletion  Scapular Bone Region No depletion  Dorsal Hand No depletion  Patellar Region No depletion   Anterior Thigh Region No depletion  Posterior Calf Region No depletion  Edema (RD Assessment) None  Hair Reviewed  Eyes Reviewed  Mouth Reviewed  Skin Reviewed  Nails Reviewed   Diet Order:   Diet Order             DIET DYS 3 Fluid consistency: Thin  Diet effective now                   EDUCATION NEEDS:   Education needs have been addressed  Skin:  Skin Assessment: Reviewed RN Assessment  Last BM:  9/16  Height:   Ht Readings from Last 1 Encounters:  02/18/23 5' 0.98" (1.549 m)    Weight:   Wt Readings from Last 1 Encounters:  02/18/23 72.8 kg    Ideal Body Weight:  47.7 kg  BMI:  Body mass index is 30.34 kg/m.  Estimated Nutritional Needs:   Kcal:  1700-1900kcal/day  Protein:  85-95g/day  Fluid:  1.4-1.6L/day  Betsey Holiday MS, RD, LDN Please refer to [facility] for RD and/or RD on-call/weekend/after hours pager

## 2023-02-19 NOTE — Plan of Care (Signed)
  Problem: Clinical Measurements: Goal: Diagnostic test results will improve Outcome: Progressing   Problem: Respiratory: Goal: Ability to maintain adequate ventilation will improve Outcome: Progressing   Problem: Education: Goal: Ability to describe self-care measures that may prevent or decrease complications (Diabetes Survival Skills Education) will improve Outcome: Progressing Goal: Individualized Educational Video(s) Outcome: Progressing   Problem: Metabolic: Goal: Ability to maintain appropriate glucose levels will improve Outcome: Progressing

## 2023-02-19 NOTE — Evaluation (Signed)
Clinical/Bedside Swallow Evaluation Patient Details  Name: Michelle [name] MRN: [id number] Date of Birth: [birth year]  Today's Date: 02/19/2023 Time: SLP Start Time (ACUTE ONLY): 1205 SLP Stop Time (ACUTE ONLY): 1220 SLP Time Calculation (min) (ACUTE ONLY): 15 min  Past Medical History:  Past Medical History:  Diagnosis Date   Allergy    Anxiety    Asthma    CHF (congestive heart failure) (HCC)    Cocaine abuse (HCC)    COPD (chronic obstructive pulmonary disease) (HCC)    Coronary artery disease    Emphysema/COPD (HCC)    Hepatitis C    High cholesterol    Hypertension    Pneumothorax    Tobacco abuse    Past Surgical History:  Past Surgical History:  Procedure Laterality Date   CARDIAC CATHETERIZATION     CHEST TUBE INSERTION     IR GASTROSTOMY TUBE MOD SED  10/31/2017   TRACHEOSTOMY TUBE PLACEMENT N/A 10/17/2017   Procedure: TRACHEOSTOMY;  Surgeon: Bud Face, MD;  Location: [facility] ORS;  Service: ENT;  Laterality: N/A;   HPI:  Pt is a 62 year old female who presented to the ED with c/o difficulty breathing and thick/yellow mucus. 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 CXR, 02/18/23, "Mild bibasilar subsegmental atelectasis or infiltrates are noted,  right greater than left. Small bilateral pleural effusions."    Assessment / Plan / Recommendation  Clinical Impression  Pt seen for clinical swallowing evaluation. Pt alert, pleasant, and cooperative. Whispering around vent. Cleared with RN. Per RN, pt on baseline vent settings. Per chart review has been consuming a PO diet since MBSS at OSH in May 2020. At that time MBSS was completed while pt on vent and noted,  "Pt presents with an oropharyngeal swallow which is [facility]. No aspiration, penetration or pharyngeal residue observed." Pt denies any changes to oropharyngeal swallow function. Noted pt with x1 strong cough prior to POs. This was not appreciated with POs. Oral motor examination was largely unremarkable with exception of aphonia due to presence of tracheostomy with vent dependence and some missing dentition. Pt observed with trials of solid, puree, and thin liquids (3 oz water challege; via straw). Pt demonstrated a grossly functional oral swallow c/b mildly prolonged, but functional, mastication of solids. Pharyngeal swallow appeared [facility] per clniical assessment. Recommend a mech soft diet with thin liquids with safe swallowing strategies/aspiration precautions as outlined below. Mech soft diet recommended in effort to promote energy conservation. SLP to f/u per POC for diet tolerance; however, suspect pt is at or near pt's baseline swallow function.  SLP Visit Diagnosis: Dysphagia, unspecified (R13.10)    Aspiration Risk  Mild aspiration risk    Diet Recommendation Dysphagia 3 (Mech soft);Thin liquid    Liquid Administration via: Spoon;Cup;Straw Medication Administration: Other (Comment) (as tolerated) Compensations: Slow rate;Small sips/bites (rest breaks PRN) Postural Changes: Seated upright at 90 degrees;Remain upright for at least 30 minutes after po intake    Other  Recommendations Oral Care Recommendations: Oral care BID (set up)    Recommendations for follow up therapy are one component of a multi-disciplinary discharge planning process, led by the attending physician.  Recommendations may be updated based on patient status, additional functional criteria and insurance authorization.  Follow up Recommendations No SLP follow up  Functional Status Assessment Patient has had a recent decline in their functional status and demonstrates the ability to make significant improvements in  function in a reasonable and predictable amount of time.  Frequency and Duration min 2x/week  2 weeks       Prognosis Prognosis for improved oropharyngeal function: Good Barriers to Reach Goals:  (comorbidities)      Swallow Study   General Date of Onset: 02/18/23 HPI: Pt is a 62 year old female who presented to the ED with c/o difficulty breathing and thick/yellow mucus. 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 CXR, 02/18/23, "Mild bibasilar subsegmental atelectasis or infiltrates are noted,  right greater than left. Small bilateral pleural effusions." Type of Study: Bedside Swallow Evaluation Previous Swallow Assessment: MBSS at OSH on 11/04/2018 recommended a regular diet with thin liquids - "Pt presents with an oropharyngeal swallow which is [facility]. No aspiration, penetration or pharyngeal residue observed." Diet Prior to this Study: NPO (mech soft with thin liquid diet entered; however, RN reported holding tray pending SLP evaluation) Temperature Spikes Noted: Yes (on admit; 100.2) Respiratory Status: Ventilator Behavior/Cognition: Alert;Cooperative;Pleasant mood Oral Cavity Assessment: Within Functional Limits Oral Care Completed by SLP: Yes Oral Cavity - Dentition: Missing dentition (functional) Vision: Functional for self-feeding Self-Feeding Abilities: Able to feed self;Needs set up Patient Positioning: Upright in bed Baseline Vocal Quality: Aphonic (due to presence of tracheostomy) Volitional Cough: Strong Volitional Swallow: Able to elicit    Oral/Motor/Sensory Function Overall Oral Motor/Sensory Function: Within functional limits   Ice Chips Ice chips: Not tested    Thin Liquid Thin Liquid: Within functional limits Presentation: Straw;Self Fed Other Comments: 3 oz water challenge    Nectar Thick Nectar Thick Liquid: Not tested   Honey Thick Honey Thick Liquid: Not tested   Puree Puree: Within functional limits Presentation: Self Fed;Spoon   Solid     Solid: Within functional limits (mildly prolonged, but functional, mastication) Presentation: Self Fed     Clyde Canterbury, M.S., CCC-SLP Speech-Language Pathologist [facility] [phone] (ASCOM)  Woodroe Chen 02/19/2023,1:08 PM

## 2023-02-19 NOTE — Inpatient Diabetes Management (Signed)
Inpatient Diabetes Program Recommendations  AACE/ADA: New Consensus Statement on Inpatient Glycemic Control (2015)  Target Ranges:  Prepandial:   less than 140 mg/dL      Peak postprandial:   less than 180 mg/dL (1-2 hours)      Critically ill patients:  140 - 180 mg/dL   Lab Results  Component Value Date   GLUCAP 146 (H) 02/19/2023   HGBA1C 10.2 (H) 09/30/2021    Review of Glycemic Control  Latest Reference Range & Units 02/18/23 19:36 02/18/23 21:08 02/19/23 01:11 02/19/23 04:09 02/19/23 07:38 02/19/23 11:46  Glucose-Capillary 70 - 99 mg/dL 161 (H) 096 (H) 045 (H) 237 (H) 216 (H) 146 (H)   Diabetes history: DM  Outpatient Diabetes medications:  Trulicity 4.5 mg weekly Lantus 10 units q HS Current orders for Inpatient glycemic control:  Novolog 0-15 units tid with meals and HS Semglee 5 units q HS Solumedrol 40 mg daily (for 2 doses) Inpatient Diabetes Program Recommendations:   Consider increasing Semglee to 10 units q HS (this was home dose)  Thanks,  Beryl Meager, RN, BC-ADM Inpatient Diabetes Coordinator Pager [phone]  (8a-5p)

## 2023-02-20 DIAGNOSIS — U071 COVID-19: Secondary | ICD-10-CM | POA: Diagnosis not present

## 2023-02-20 LAB — GLUCOSE, CAPILLARY
Glucose-Capillary: 306 mg/dL — ABNORMAL HIGH (ref 70–99)
Glucose-Capillary: 262 mg/dL — ABNORMAL HIGH (ref 70–99)
Glucose-Capillary: 236 mg/dL — ABNORMAL HIGH (ref 70–99)
Glucose-Capillary: 208 mg/dL — ABNORMAL HIGH (ref 70–99)

## 2023-02-20 MED ORDER — IPRATROPIUM-ALBUTEROL 0.5-2.5 (3) MG/3ML IN SOLN
3.0000 mL | RESPIRATORY_TRACT | Status: DC | PRN
  Administered 2023-02-20: 3 mL via RESPIRATORY_TRACT

## 2023-02-20 MED ORDER — IPRATROPIUM-ALBUTEROL 0.5-2.5 (3) MG/3ML IN SOLN
3.0000 mL | Freq: Four times a day (QID) | RESPIRATORY_TRACT | Status: DC
  Administered 2023-02-20 – 2023-02-22 (×9): 3 mL via RESPIRATORY_TRACT
  Filled 2023-02-20 (×9): qty 3

## 2023-02-20 MED ORDER — ORAL CARE MOUTH RINSE: 15.0000 mL | OROMUCOSAL | Status: DC | PRN

## 2023-02-20 MED ORDER — DIAZEPAM 5 MG/ML IJ SOLN
5.0000 mg | Freq: Once | INTRAMUSCULAR | Status: AC
  Administered 2023-02-20: 5 mg via INTRAVENOUS
  Filled 2023-02-20: qty 2

## 2023-02-20 MED ORDER — ORAL CARE MOUTH RINSE
15.0000 mL | OROMUCOSAL | Status: DC
  Administered 2023-02-20 – 2023-02-22 (×23): 15 mL via OROMUCOSAL

## 2023-02-20 MED ORDER — NIRMATRELVIR/RITONAVIR (PAXLOVID)TABLET
3.0000 | ORAL_TABLET | Freq: Two times a day (BID) | ORAL | Status: DC
  Administered 2023-02-20 – 2023-02-22 (×5): 3 via ORAL
  Filled 2023-02-20: qty 30

## 2023-02-20 MED ORDER — CLONAZEPAM 0.5 MG PO TABS
1.0000 mg | ORAL_TABLET | Freq: Three times a day (TID) | ORAL | Status: DC
  Administered 2023-02-20 – 2023-02-22 (×8): 1 mg via ORAL
  Filled 2023-02-20 (×8): qty 2

## 2023-02-20 MED ORDER — BUDESONIDE 0.25 MG/2ML IN SUSP
0.2500 mg | Freq: Two times a day (BID) | RESPIRATORY_TRACT | Status: DC
  Administered 2023-02-20 – 2023-02-22 (×5): 0.25 mg via RESPIRATORY_TRACT
  Filled 2023-02-20 (×5): qty 2

## 2023-02-20 MED ORDER — LABETALOL HCL 5 MG/ML IV SOLN
10.0000 mg | INTRAVENOUS | Status: DC | PRN
  Administered 2023-02-20 (×2): 10 mg via INTRAVENOUS
  Filled 2023-02-20 (×2): qty 4

## 2023-02-20 NOTE — Progress Notes (Signed)
PROGRESS NOTE    Michelle [name]   ZOX:[id number] DOB: August 12, 1960  DOA: 02/18/2023 Date of Service: 02/20/23 PCP: Abram Sander, MD     Brief Narrative / Hospital Course:  Michelle [name] is a 62 year old female with history of MRSA and Pseudomonas pneumonia, severe atypical pneumonia, COPD, on chronic trach with vent at home, history of cocaine abuse, congestive heart failure, hyperlipidemia, hypertension, history of tobacco use, history of left local axilla abscess, emphysema, coronary artery disease, anxiety, asthma, depression, insulin-dependent diabetes mellitus, history of morbid obesity, who presents to the emergency department for chief concerns of difficulty breathing, thick yellow mucus. Tmax of 100.8 at home and has taken Tylenol prior to ED presentation. 09/16: ED administered cefepime 2 g IV one-time dose, vancomycin 2 g IV one-time dose. Admitted to hospitalist service for sepsis d/t COVID.  09/17: continuing treatment for COPD/COVID.   Consultants:  none  Procedures: none      ASSESSMENT & PLAN:   Principal Problem:   COVID-19 virus infection Active Problems:   Community acquired pneumonia   Chronic respiratory failure requiring continuous mechanical ventilation through tracheostomy (HCC)   Hypokalemia   Depression with anxiety   Hyperlipidemia associated with type 2 diabetes mellitus (HCC)   COPD (chronic obstructive pulmonary disease) (HCC)   Tobacco abuse   Sepsis (HCC)   Protein-calorie malnutrition, severe   Acute on chronic respiratory failure with hypoxia (HCC)   CAD (coronary artery disease)   HTN (hypertension)   Chronic diastolic CHF (congestive heart failure) (HCC)   DM (diabetes mellitus) (HCC)   Atypical pneumonia   Tracheostomy status (HCC)   Dysuria   Elevated troponin   Obesity (BMI 30-39.9)   Sepsis Likely d/t COVID Treat underlying causes as below   Acute on chronic respiratory failure with hypoxia  Likely d/t COVID, COPD,  question CAP  Presumed secondary to atypical/community-acquired pneumonia in setting of patient with history of MRSA and Pseudomonas pneumonia with chronic tracheostomy Treat underlying causes as below   COVID IV to po steroids  Paxlovid given sx <5d and severe illness   Questionable Community Acquired Pneumonia  Continue cefepime  Have d/c vancomycin/doxycycline, Continue cefepime Following tracheal aspirate and blood cultures   COPD exacerbation d/t COVID  IV to po steroids  Bronchodilators   Hypokalemia Replace as needed Monitor BMP   Depression with anxiety Panic  Home paroxetine 40 mg every morning, mirtazapine 30 mg nightly, hydroxyzine 25 mg p.o. 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 and has high risk of aspiration. Plan to resume home long-acting insulin when the benefits outweigh the risk Insulin SSI with at bedtime coverage ordered   HTN (hypertension) Home amlodipine 5 mg daily and weekly clonidine patch resumed on admission  IV labetalol prn    CAD (coronary artery disease) Home aspirin 81 mg daily resumed  Protein-calorie malnutrition, severe Consult placed to registered dietitian   Tobacco abuse Patient no longer uses  tobacco products    obesity based on BMI: Body mass index is 31.47 kg/m.   DVT prophylaxis: lovenox  IV fluids: no continuous IV fluids  Nutrition: dyaphgia diet Central lines / invasive devices: tracheostomy  Code Status: DNR ACP documentation reviewed:  MOST and DNR on file in [facility]  TOC needs: TBD Barriers to dispo / significant pending items: clinical improvement              Subjective / Brief ROS:  Patient reports feeling like can't breathe  Denies CP/SOB.  Pain controlled.  Denies new weakness.  Tolerating diet.  Reports no concerns w/ urination/defecation.   Family Communication: family at bedside on rounds    Objective Findings:  Vitals:   02/20/23 1500 02/20/23 1530 02/20/23 1600 02/20/23 1624  BP: 122/72 131/77 (!) 141/88   Pulse: 76 77 80   Resp: (!) 21 17 14    Temp:   98.4 F (36.9 C)   TempSrc:   Oral   SpO2: 100% 100% 100% 100%  Weight:      Height:        Intake/Output Summary (Last 24 hours) at 02/20/2023 1654 Last data filed at 02/20/2023 1100 Gross per 24 hour  Intake 196.87 ml  Output 2150 ml  Net -1953.13 ml   Filed Weights   02/18/23 1257 02/18/23 1749 02/20/23 0500  Weight: (S) 95.7 kg 72.8 kg 75.5 kg    Examination:  Physical Exam Constitutional:      General: She is not in acute distress.    Appearance: She is obese.  Neck:     Trachea: Tracheostomy present.  Cardiovascular:     Rate and Rhythm: Regular rhythm. Tachycardia present.  Pulmonary:     Effort: Tachypnea and accessory muscle usage present.     Breath sounds: Decreased breath sounds present.  Musculoskeletal:     Right lower leg: No edema.     Left lower leg: No edema.  Skin:    General: Skin is warm and dry.  Neurological:     General: No focal deficit present.     Mental Status: She is alert and oriented to person, place, and time.  Psychiatric:        Mood and Affect: Mood is anxious.          Scheduled Medications:   amLODipine  5  mg Oral Daily   aspirin  81 mg Oral Daily   budesonide (PULMICORT) nebulizer solution  0.25 mg Nebulization BID   Chlorhexidine Gluconate Cloth  6 each Topical Daily   clonazePAM  1 mg Oral TID   cloNIDine  0.1 mg Transdermal Weekly   enoxaparin (LOVENOX) injection  40 mg Subcutaneous Q24H   guaiFENesin  600 mg Oral BID   insulin aspart  0-15 Units Subcutaneous TID WC   insulin aspart  0-5 Units Subcutaneous QHS   ipratropium-albuterol  3 mL Nebulization QID   mirtazapine  30 mg Oral QHS   multivitamin with minerals  1 tablet Oral Daily   nirmatrelvir/ritonavir  3 tablet Oral BID   mouth rinse  15 mL Mouth Rinse Q2H   PARoxetine  40 mg Oral Daily   Ensure Max Protein  11 oz Oral BID   Vitamin D (Ergocalciferol)  50,000 Units Oral Weekly    Continuous Infusions:  sodium chloride 5 mL/hr at 02/20/23 0347   ceFEPime (MAXIPIME) IV 2 g (02/20/23 1505)  PRN Medications:  sodium chloride, acetaminophen **OR** acetaminophen, azelastine, clonazepam, hydrALAZINE, ipratropium-albuterol, labetalol, ondansetron **OR** ondansetron (ZOFRAN) IV, mouth rinse, oxyCODONE-acetaminophen  Antimicrobials from admission:  Anti-infectives (From admission, onward)    Start     Dose/Rate Route Frequency Ordered Stop   02/20/23 2200  nirmatrelvir/ritonavir (PAXLOVID) 3 tablet        3 tablet Oral 2 times daily 02/20/23 1652 02/25/23 2159   02/19/23 1500  vancomycin (VANCOREADY) IVPB 1250 mg/250 mL  Status:  Discontinued        1,250 mg 166.7 mL/hr over 90 Minutes Intravenous Every 24 hours 02/18/23 1528 02/19/23 1026   02/18/23 2200  ceFEPIme (MAXIPIME) 2 g in sodium chloride 0.9 % 100 mL IVPB        2 g 200 mL/hr over 30 Minutes Intravenous Every 8 hours 02/18/23 1528 02/25/23 2159   02/18/23 1800  doxycycline (VIBRAMYCIN) 100 mg in sodium chloride 0.9 % 250 mL IVPB  Status:  Discontinued       Note to Pharmacy: Atypical pna coverage as she as prolong Qtc on admission EKG. Thank you.   100 mg 125  mL/hr over 120 Minutes Intravenous Every 12 hours 02/18/23 1439 02/19/23 1026   02/18/23 1445  azithromycin (ZITHROMAX) 500 mg in sodium chloride 0.9 % 250 mL IVPB  Status:  Discontinued        500 mg 250 mL/hr over 60 Minutes Intravenous Every 24 hours 02/18/23 1431 02/18/23 1438   02/18/23 1400  vancomycin (VANCOREADY) IVPB 2000 mg/400 mL        2,000 mg 200 mL/hr over 120 Minutes Intravenous  Once 02/18/23 1346 02/18/23 1802   02/18/23 1345  vancomycin (VANCOCIN) IVPB 1000 mg/200 mL premix  Status:  Discontinued        1,000 mg 200 mL/hr over 60 Minutes Intravenous  Once 02/18/23 1341 02/18/23 1346   02/18/23 1345  ceFEPIme (MAXIPIME) 2 g in sodium chloride 0.9 % 100 mL IVPB        2 g 200 mL/hr over 30 Minutes Intravenous  Once 02/18/23 1341 02/18/23 1455           Data Reviewed:  I have personally reviewed the following...  CBC: Recent Labs  Lab 02/18/23 1310 02/19/23 0433  WBC 5.4 4.2  NEUTROABS 4.5  --   HGB 11.7* 12.5  HCT 37.7 39.7  MCV 81.8 80.5  PLT 211 237   Basic Metabolic Panel: Recent Labs  Lab 02/18/23 1310 02/18/23 2019 02/19/23 0433 02/20/23 0227  NA 132*  --  134* 137  K 3.2*  --  4.6 4.1  CL 96*  --  103 106  CO2 24  --  22 23  GLUCOSE 239*  --  250* 253*  BUN 18  --  22 25*  CREATININE 0.70  --  0.66 0.74  CALCIUM 8.3*  --  8.6* 8.5*  MG  --  1.9  --   --    GFR: Estimated Creatinine Clearance: 68.7 mL/min (by C-G formula based on SCr of 0.74 mg/dL). Liver Function Tests: Recent Labs  Lab 02/18/23 1310  AST 22  ALT 15  ALKPHOS 75  BILITOT 0.8  PROT 8.1  ALBUMIN 3.6   No results for input(s): "LIPASE", "AMYLASE" in the last 168 hours. No results for input(s): "AMMONIA" in the last 168 hours. Coagulation Profile: No results for input(s): "INR", "PROTIME" in the last 168 hours. Cardiac Enzymes: No results for input(s): "CKTOTAL", "CKMB", "CKMBINDEX", "TROPONINI" in the last 168 hours. BNP (  last 3 results) No results for  input(s): "PROBNP" in the last 8760 hours. HbA1C: Recent Labs    02/19/23 0433  HGBA1C 10.3*   CBG: Recent Labs  Lab 02/19/23 1529 02/19/23 2016 02/20/23 0756 02/20/23 1122 02/20/23 1600  GLUCAP 281* 356* 306* 262* 236*   Lipid Profile: No results for input(s): "CHOL", "HDL", "LDLCALC", "TRIG", "CHOLHDL", "LDLDIRECT" in the last 72 hours. Thyroid Function Tests: No results for input(s): "TSH", "T4TOTAL", "FREET4", "T3FREE", "THYROIDAB" in the last 72 hours. Anemia Panel: No results for input(s): "VITAMINB12", "FOLATE", "FERRITIN", "TIBC", "IRON", "RETICCTPCT" in the last 72 hours. Most Recent Urinalysis On File:     Component Value Date/Time   COLORURINE YELLOW (A) 02/19/2023 0546   APPEARANCEUR CLEAR (A) 02/19/2023 0546   APPEARANCEUR Clear 10/30/2013 1230   LABSPEC 1.023 02/19/2023 0546   LABSPEC 1.005 10/30/2013 1230   PHURINE 6.0 02/19/2023 0546   GLUCOSEU >=500 (A) 02/19/2023 0546   GLUCOSEU Negative 10/30/2013 1230   HGBUR NEGATIVE 02/19/2023 0546   BILIRUBINUR NEGATIVE 02/19/2023 0546   BILIRUBINUR Negative 10/30/2013 1230   KETONESUR NEGATIVE 02/19/2023 0546   PROTEINUR 100 (A) 02/19/2023 0546   NITRITE NEGATIVE 02/19/2023 0546   LEUKOCYTESUR NEGATIVE 02/19/2023 0546   LEUKOCYTESUR Negative 10/30/2013 1230   Sepsis Labs: @LABRCNTIP (procalcitonin:4,lacticidven:4) Microbiology: Recent Results (from the past 240 hour(s))  Culture, blood (routine x 2)     Status: None (Preliminary result)   Collection Time: 02/18/23  1:01 PM   Specimen: BLOOD  Result Value Ref Range Status   Specimen Description BLOOD BLOOD RIGHT ARM  Final   Special Requests   Final    BOTTLES DRAWN AEROBIC AND ANAEROBIC Blood Culture adequate volume   Culture   Final    NO GROWTH 2 DAYS Performed at [facility], [address] [place], [place] [address]    Report Status PENDING  Incomplete  SARS Coronavirus 2 by RT PCR (hospital order, performed in [facility] hospital lab)  *cepheid single result test* Anterior Nasal Swab     Status: Abnormal   Collection Time: 02/18/23  2:29 PM   Specimen: Anterior Nasal Swab  Result Value Ref Range Status   SARS Coronavirus 2 by RT PCR POSITIVE (A) NEGATIVE Final    Comment: (NOTE) SARS-CoV-2 target nucleic acids are DETECTED  SARS-CoV-2 RNA is generally detectable in upper respiratory specimens  during the acute phase of infection.  Positive results are indicative  of the presence of the identified virus, but do not rule out bacterial infection or co-infection with other pathogens not detected by the test.  Clinical correlation with patient history and  other diagnostic information is necessary to determine patient infection status.  The expected result is negative.  Fact Sheet for Patients:   RoadLapTop.co.za   Fact Sheet for Healthcare Providers:   http://kim-miller.com/    This test is not yet approved or cleared by the [place] FDA and  has been authorized for detection and/or diagnosis of SARS-CoV-2 by FDA under an Emergency Use Authorization (EUA).  This EUA will remain in effect (meaning this test can be used) for the duration of  the COVID-19 declaration under Section 564(b)(1)  of the Act, 21 U.S.C. section 360-bbb-3(b)(1), unless the authorization is terminated or revoked sooner.   Performed at [facility], [address] [place], [place] [address]   Culture, blood (routine x 2)     Status: None (Preliminary result)   Collection Time: 02/18/23  3:16 PM   Specimen: BLOOD  Result  Value Ref Range Status   Specimen Description BLOOD BLOOD LEFT HAND  Final   Special Requests   Final    BOTTLES DRAWN AEROBIC AND ANAEROBIC Blood Culture adequate volume   Culture   Final    NO GROWTH 2 DAYS Performed at [facility], [address], [place], [place] [address]    Report Status PENDING  Incomplete  MRSA Next Gen by PCR, Nasal      Status: None   Collection Time: 02/18/23  5:59 PM   Specimen: Nasal Mucosa; Nasal Swab  Result Value Ref Range Status   MRSA by PCR Next Gen NOT DETECTED NOT DETECTED Final    Comment: (NOTE) The GeneXpert MRSA Assay (FDA approved for NASAL specimens only), is one component of a comprehensive MRSA colonization surveillance program. It is not intended to diagnose MRSA infection nor to guide or monitor treatment for MRSA infections. Test performance is not FDA approved in patients less than 60 years old. Performed at [facility], [address] [place], [place] [address]   Respiratory (~20 pathogens) panel by PCR     Status: None   Collection Time: 02/19/23  6:04 AM   Specimen: Nasopharyngeal Swab; Respiratory  Result Value Ref Range Status   Adenovirus NOT DETECTED NOT DETECTED Final   Coronavirus 229E NOT DETECTED NOT DETECTED Final    Comment: (NOTE) The Coronavirus on the Respiratory Panel, DOES NOT test for the novel  Coronavirus (2019 nCoV)    Coronavirus HKU1 NOT DETECTED NOT DETECTED Final   Coronavirus NL63 NOT DETECTED NOT DETECTED Final   Coronavirus OC43 NOT DETECTED NOT DETECTED Final   Metapneumovirus NOT DETECTED NOT DETECTED Final   Rhinovirus / Enterovirus NOT DETECTED NOT DETECTED Final   Influenza A NOT DETECTED NOT DETECTED Final   Influenza B NOT DETECTED NOT DETECTED Final   Parainfluenza Virus 1 NOT DETECTED NOT DETECTED Final   Parainfluenza Virus 2 NOT DETECTED NOT DETECTED Final   Parainfluenza Virus 3 NOT DETECTED NOT DETECTED Final   Parainfluenza Virus 4 NOT DETECTED NOT DETECTED Final   Respiratory Syncytial Virus NOT DETECTED NOT DETECTED Final   Bordetella pertussis NOT DETECTED NOT DETECTED Final   Bordetella Parapertussis NOT DETECTED NOT DETECTED Final   Chlamydophila pneumoniae NOT DETECTED NOT DETECTED Final   Mycoplasma pneumoniae NOT DETECTED NOT DETECTED Final    Comment: Performed at [facility], [address]  [place], [place] [address]  Culture, Respiratory w Gram Stain     Status: None (Preliminary result)   Collection Time: 02/19/23 11:27 AM   Specimen: Tracheal Aspirate; Respiratory  Result Value Ref Range Status   Specimen Description   Final    TRACHEAL ASPIRATE Performed at [facility], [address] [place], [place] [address]    Special Requests   Final    NONE Performed at [facility], [address] [place], [place] [address]    Gram Stain   Final    ABUNDANT WBC PRESENT, PREDOMINANTLY PMN RARE GRAM NEGATIVE RODS RARE GRAM POSITIVE COCCI RARE GRAM POSITIVE RODS    Culture   Final    FEW GRAM NEGATIVE RODS CULTURE REINCUBATED FOR BETTER GROWTH Performed at [facility], [address] [place], [place] [address]    Report Status PENDING  Incomplete      Radiology Studies last 3 days: DG Chest Portable 1 View  Result Date: 02/18/2023 CLINICAL DATA:  Shortness of breath. EXAM: PORTABLE CHEST 1 VIEW COMPARISON:  December 31, 2022. FINDINGS: Stable cardiomediastinal silhouette. Tracheostomy tube is in grossly good position. Mild bibasilar subsegmental atelectasis or infiltrates are noted, right greater than left. Small bilateral pleural effusions are noted. Bony thorax is unremarkable. IMPRESSION: Mild bibasilar subsegmental atelectasis or infiltrates are noted, right greater than left. Small bilateral pleural effusions. Electronically Signed   By: Lupita Raider M.D.   On: 02/18/2023 14:50             LOS: 2 days      Sunnie Nielsen, DO [facility] 02/20/2023, 4:54 PM    Dictation software may have been used to generate the above note. Typos may occur and escape review in typed/dictated notes. Please contact Dr Lyn Hollingshead directly for clarity if needed.  Staff may message me via secure chat in Epic  but this may not receive an immediate response,  please page me for urgent matters!  If 7PM-7AM, please contact night  coverage [email or web address]

## 2023-02-20 NOTE — Plan of Care (Signed)
  Problem: Clinical Measurements: Goal: Diagnostic test results will improve Outcome: Progressing Goal: Signs and symptoms of infection will decrease Outcome: Progressing   Problem: Respiratory: Goal: Ability to maintain adequate ventilation will improve Outcome: Progressing   Problem: Coping: Goal: Ability to adjust to condition or change in health will improve Outcome: Progressing

## 2023-02-20 NOTE — Inpatient Diabetes Management (Signed)
Inpatient Diabetes Program Recommendations  AACE/ADA: New Consensus Statement on Inpatient Glycemic Control (2015)  Target Ranges:  Prepandial:   less than 140 mg/dL      Peak postprandial:   less than 180 mg/dL (1-2 hours)      Critically ill patients:  140 - 180 mg/dL   Lab Results  Component Value Date   GLUCAP 306 (H) 02/20/2023   HGBA1C 10.3 (H) 02/19/2023    Review of Glycemic Control  Latest Reference Range & Units 02/19/23 07:38 02/19/23 11:46 02/19/23 15:29 02/19/23 20:16 02/20/23 07:56  Glucose-Capillary 70 - 99 mg/dL 295 (H) 284 (H) 132 (H) 356 (H) 306 (H)  (H): Data is abnormally high  Diabetes history: DM2 Outpatient Diabetes medications:  Lantus 10 units every day Trulicity 4.5 mg weekly Current orders for Inpatient glycemic control:  Novolog 0-15 units TID and 0-5 units QHS  Inpatient Diabetes Program Recommendations:    Appears steroids have been discontinued as well as basal insulin.  Please consider:  Semglee 8 units every day  Will continue to follow while inpatient.  Thank you, Dulce Sellar, MSN, CDCES Diabetes Coordinator Inpatient Diabetes Program [phone] (team pager from 8a-5p)

## 2023-02-20 NOTE — Progress Notes (Addendum)
Spoke with Dr. Lyn Hollingshead and made her aware of patient being anxious, short of breath, BP 216/126 and has had klonopin, neb and PRN hydralazine. MD acknowledged and placed orders.

## 2023-02-20 NOTE — Plan of Care (Signed)

## 2023-02-21 DIAGNOSIS — U071 COVID-19: Secondary | ICD-10-CM | POA: Diagnosis not present

## 2023-02-21 LAB — BASIC METABOLIC PANEL
Anion gap: 9 (ref 5–15)
BUN: 22 mg/dL (ref 8–23)
CO2: 24 mmol/L (ref 22–32)
Calcium: 8.6 mg/dL — ABNORMAL LOW (ref 8.9–10.3)
Chloride: 105 mmol/L (ref 98–111)
Creatinine, Ser: 0.72 mg/dL (ref 0.44–1.00)
GFR, Estimated: >60 mL/min (ref >60)
Glucose, Bld: 228 mg/dL — ABNORMAL HIGH (ref 70–99)
Potassium: 3.7 mmol/L (ref 3.5–5.1)
Sodium: 138 mmol/L (ref 135–145)

## 2023-02-21 LAB — GLUCOSE, CAPILLARY
Glucose-Capillary: 186 mg/dL — ABNORMAL HIGH (ref 70–99)
Glucose-Capillary: 191 mg/dL — ABNORMAL HIGH (ref 70–99)
Glucose-Capillary: 236 mg/dL — ABNORMAL HIGH (ref 70–99)
Glucose-Capillary: 314 mg/dL — ABNORMAL HIGH (ref 70–99)

## 2023-02-21 MED ORDER — CLONAZEPAM 0.5 MG PO TABS
0.5000 mg | ORAL_TABLET | Freq: Once | ORAL | Status: AC
  Administered 2023-02-21: 0.5 mg via ORAL
  Filled 2023-02-21: qty 1

## 2023-02-21 MED ORDER — INSULIN GLARGINE-YFGN 100 UNIT/ML ~~LOC~~ SOLN
8.0000 [IU] | Freq: Every day | SUBCUTANEOUS | Status: DC
  Administered 2023-02-21 – 2023-02-22 (×2): 8 [IU] via SUBCUTANEOUS
  Filled 2023-02-21 (×2): qty 0.08

## 2023-02-21 MED ORDER — PREDNISONE 10 MG PO TABS
50.0000 mg | ORAL_TABLET | Freq: Every day | ORAL | Status: DC
  Administered 2023-02-21 – 2023-02-22 (×2): 50 mg via ORAL
  Filled 2023-02-21 (×2): qty 5

## 2023-02-21 MED ORDER — OXYCODONE-ACETAMINOPHEN 5-325 MG PO TABS
1.0000 | ORAL_TABLET | Freq: Four times a day (QID) | ORAL | Status: DC | PRN
  Administered 2023-02-21 – 2023-02-22 (×2): 1 via ORAL
  Filled 2023-02-21 (×2): qty 1

## 2023-02-21 NOTE — Plan of Care (Signed)
  Problem: Education: Goal: Knowledge of General Education information will improve Description: Including pain rating scale, medication(s)/side effects and non-pharmacologic comfort measures Outcome: Progressing   Problem: Elimination: Goal: Will not experience complications related to urinary retention Outcome: Progressing   Problem: Safety: Goal: Ability to remain free from injury will improve Outcome: Progressing   Problem: Skin Integrity: Goal: Risk for impaired skin integrity will decrease Outcome: Progressing   Problem: Coping: Goal: Psychosocial and spiritual needs will be supported Outcome: Progressing

## 2023-02-21 NOTE — Progress Notes (Signed)
RT note:  Patient removed from [facility]o-[facility] and placed on home vent.  Home vent prescription as follows: SIMV/VC/PS VT 400  RR 16  PEEP 5  PS 10  Ti .7  O2:5L bleed in  HR and SpO2 stable.  Patient calm and tolerating well.  Will continue to monitor.

## 2023-02-21 NOTE — Progress Notes (Signed)
Pt on home Trilogy vent.  VS stable. Pt comfortable

## 2023-02-21 NOTE — Progress Notes (Signed)
PROGRESS NOTE    Michelle [name]   ZOX:[id number] DOB: 1961/03/25  DOA: 02/18/2023 Date of Service: 02/21/23 PCP: Abram Sander, MD     Brief Narrative / Hospital Course:  [name] is a 62 year old female with history of MRSA and Pseudomonas pneumonia, severe atypical pneumonia, COPD, on chronic trach with vent at home, history of cocaine abuse, congestive heart failure, hyperlipidemia, hypertension, history of tobacco use, history of left local axilla abscess, emphysema, coronary artery disease, anxiety, asthma, depression, insulin-dependent diabetes mellitus, history of morbid obesity, who presents to the emergency department for chief concerns of difficulty breathing, thick yellow mucus. Tmax of 100.8 at home and has taken Tylenol prior to ED presentation. 09/16: ED administered cefepime 2 g IV one-time dose, vancomycin 2 g IV one-time dose. Admitted to hospitalist service for sepsis d/t COVID.  09/17-09/19: continuing treatment for COPD/COVID. Persistent increased WOB is improving into 09/19 and ok for transfer off SDU  Consultants:  none  Procedures: none      ASSESSMENT & PLAN:   Principal Problem:   COVID-19 virus infection Active Problems:   Community acquired pneumonia   Chronic respiratory failure requiring continuous mechanical ventilation through tracheostomy (HCC)   Hypokalemia   Depression with anxiety   Hyperlipidemia associated with type 2 diabetes mellitus (HCC)   COPD (chronic obstructive pulmonary disease) (HCC)   Tobacco abuse   Sepsis (HCC)   Protein-calorie malnutrition, severe   Acute on chronic respiratory failure with hypoxia (HCC)   CAD (coronary artery disease)   HTN (hypertension)   Chronic diastolic CHF (congestive heart failure) (HCC)   DM (diabetes mellitus) (HCC)   Atypical pneumonia   Tracheostomy status (HCC)   Dysuria   Elevated troponin   Obesity (BMI 30-39.9)   Sepsis Likely d/t COVID Treat underlying causes as below    Acute on chronic respiratory failure with hypoxia  Likely d/t COVID, COPD, question CAP  Presumed secondary to atypical/community-acquired pneumonia in setting of patient with history of MRSA and Pseudomonas pneumonia with chronic tracheostomy Treat underlying causes as below   COVID IV to po steroids  Paxlovid given sx <5d and severe illness   Questionable Community Acquired Pneumonia  Continue cefepime  Have d/c vancomycin/doxycycline, Continue cefepime Following tracheal aspirate and blood cultures   COPD exacerbation d/t COVID  IV to po steroids  ICS w/ budesonide nebs bid  Bronchodilators w/ duonebs qid + q4h prn  Hypokalemia Replace as needed Monitor BMP   Depression with anxiety Panic  Home paroxetine 40 mg every morning, mirtazapine 30 mg nightly, hydroxyzine 25 mg p.o. 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 and has high risk of aspiration. Plan to resume home long-acting insulin when the benefits outweigh the risk Insulin SSI with at bedtime coverage ordered   HTN (hypertension) Home amlodipine 5 mg daily and  weekly clonidine patch resumed on admission  IV labetalol prn    CAD (coronary artery disease) Home aspirin 81 mg daily  resumed      Protein-calorie malnutrition, severe Consult placed to registered dietitian   Tobacco abuse Patient no longer uses tobacco products    obesity based on BMI: Body mass index is 31.47 kg/m.   DVT prophylaxis: lovenox  IV fluids: no continuous IV fluids  Nutrition: dyaphgia diet Central lines / invasive devices: tracheostomy  Code Status: DNR ACP documentation reviewed:  MOST and DNR on file in [facility]  TOC needs: TBD Barriers to dispo / significant pending items: clinical improvement              Subjective / Brief ROS:  Patient reports breathing feels much better today  Denies CP.  Pain controlled.  Denies new weakness.  Tolerating diet.  Reports no concerns w/ urination/defecation.   Family Communication: none at this time     Objective Findings:  Vitals:   02/21/23 1200 02/21/23 1242 02/21/23 1300 02/21/23 1400  BP: 139/82  (!) 142/77 131/79  Pulse: 76  74 73  Resp: 13  12 12   Temp:  98.4 F (36.9 C)    TempSrc:  Oral    SpO2: 99%  98% 99%  Weight:      Height:        Intake/Output Summary (Last 24 hours) at 02/21/2023 1419 Last data filed at 02/21/2023 0700 Gross per 24 hour  Intake 579.75 ml  Output 600 ml  Net -20.25 ml   Filed Weights   02/18/23 1749 02/20/23 0500 02/21/23 0500  Weight: 72.8 kg 75.5 kg 72.4 kg    Examination:  Physical Exam Constitutional:      General: She is not in acute distress.    Appearance: She is obese.  Neck:     Trachea: Tracheostomy present.  Cardiovascular:     Rate and Rhythm: Normal rate and regular rhythm.  Pulmonary:     Effort: Pulmonary effort is normal. No tachypnea or accessory muscle usage.     Breath sounds: Decreased breath sounds present.  Musculoskeletal:     Right lower leg: No edema.     Left lower leg: No edema.  Skin:    General: Skin is warm and dry.  Neurological:     General: No focal deficit present.     Mental Status: She is alert and oriented to person,  place, and time.  Psychiatric:        Mood and Affect: Mood is not anxious.        Behavior: Behavior normal.          Scheduled Medications:   amLODipine  5 mg Oral Daily   aspirin  81 mg Oral Daily   budesonide (PULMICORT) nebulizer solution  0.25 mg Nebulization BID   Chlorhexidine Gluconate Cloth  6 each Topical Daily   clonazePAM  1 mg Oral TID   cloNIDine  0.1 mg Transdermal Weekly   enoxaparin (LOVENOX) injection  40 mg Subcutaneous Q24H   guaiFENesin  600 mg Oral BID   insulin aspart  0-15 Units Subcutaneous TID WC   insulin aspart  0-5 Units Subcutaneous QHS   insulin glargine-yfgn  8 Units Subcutaneous Daily   ipratropium-albuterol  3 mL Nebulization QID   mirtazapine  30 mg Oral QHS   multivitamin with minerals  1 tablet Oral Daily   nirmatrelvir/ritonavir  3 tablet Oral BID   mouth rinse  15 mL Mouth Rinse  Q2H   PARoxetine  40 mg Oral Daily   predniSONE  50 mg Oral Q breakfast   Ensure Max Protein  11 oz Oral BID   Vitamin D (Ergocalciferol)  50,000 Units Oral Weekly    Continuous Infusions:  sodium chloride Stopped (02/20/23 2312)   ceFEPime (MAXIPIME) IV Stopped (02/21/23 0657)    PRN Medications:  sodium chloride, acetaminophen **OR** acetaminophen, azelastine, hydrALAZINE, ipratropium-albuterol, labetalol, ondansetron **OR** ondansetron (ZOFRAN) IV, mouth rinse, oxyCODONE-acetaminophen  Antimicrobials from admission:  Anti-infectives (From admission, onward)    Start     Dose/Rate Route Frequency Ordered Stop   02/20/23 2200  nirmatrelvir/ritonavir (PAXLOVID) 3 tablet        3 tablet Oral 2 times daily 02/20/23 1652 02/25/23 2159   02/19/23 1500  vancomycin (VANCOREADY) IVPB 1250 mg/250 mL  Status:  Discontinued        1,250 mg 166.7 mL/hr over 90 Minutes Intravenous Every 24 hours 02/18/23 1528 02/19/23 1026   02/18/23 2200  ceFEPIme (MAXIPIME) 2 g in sodium chloride 0.9 % 100 mL IVPB        2 g 200 mL/hr over 30 Minutes Intravenous Every 8 hours  02/18/23 1528 02/25/23 2159   02/18/23 1800  doxycycline (VIBRAMYCIN) 100 mg in sodium chloride 0.9 % 250 mL IVPB  Status:  Discontinued       Note to Pharmacy: Atypical pna coverage as she as prolong Qtc on admission EKG. Thank you.   100 mg 125 mL/hr over 120 Minutes Intravenous Every 12 hours 02/18/23 1439 02/19/23 1026   02/18/23 1445  azithromycin (ZITHROMAX) 500 mg in sodium chloride 0.9 % 250 mL IVPB  Status:  Discontinued        500 mg 250 mL/hr over 60 Minutes Intravenous Every 24 hours 02/18/23 1431 02/18/23 1438   02/18/23 1400  vancomycin (VANCOREADY) IVPB 2000 mg/400 mL        2,000 mg 200 mL/hr over 120 Minutes Intravenous  Once 02/18/23 1346 02/18/23 1802   02/18/23 1345  vancomycin (VANCOCIN) IVPB 1000 mg/200 mL premix  Status:  Discontinued        1,000 mg 200 mL/hr over 60 Minutes Intravenous  Once 02/18/23 1341 02/18/23 1346   02/18/23 1345  ceFEPIme (MAXIPIME) 2 g in sodium chloride 0.9 % 100 mL IVPB        2 g 200 mL/hr over 30 Minutes Intravenous  Once 02/18/23 1341 02/18/23 1455           Data Reviewed:  I have personally reviewed the following...  CBC: Recent Labs  Lab 02/18/23 1310 02/19/23 0433  WBC 5.4 4.2  NEUTROABS 4.5  --   HGB 11.7* 12.5  HCT 37.7 39.7  MCV 81.8 80.5  PLT 211 237   Basic Metabolic Panel: Recent Labs  Lab 02/18/23 1310 02/18/23 2019 02/19/23 0433 02/20/23 0227 02/21/23 0250  NA 132*  --  134* 137 138  K 3.2*  --  4.6 4.1 3.7  CL 96*  --  103 106 105  CO2 24  --  22 23 24   GLUCOSE 239*  --  250* 253* 228*  BUN 18  --  22 25* 22  CREATININE 0.70  --  0.66 0.74 0.72  CALCIUM 8.3*  --  8.6* 8.5* 8.6*  MG  --  1.9  --   --   --    GFR: Estimated Creatinine Clearance: 67.2 mL/min (by C-G formula based on SCr of 0.72 mg/dL). Liver Function Tests: Recent Labs  Lab  02/18/23 1310  AST 22  ALT 15  ALKPHOS 75  BILITOT 0.8  PROT 8.1  ALBUMIN 3.6   No results for input(s): "LIPASE", "AMYLASE" in the last 168  hours. No results for input(s): "AMMONIA" in the last 168 hours. Coagulation Profile: No results for input(s): "INR", "PROTIME" in the last 168 hours. Cardiac Enzymes: No results for input(s): "CKTOTAL", "CKMB", "CKMBINDEX", "TROPONINI" in the last 168 hours. BNP (last 3 results) No results for input(s): "PROBNP" in the last 8760 hours. HbA1C: Recent Labs    02/19/23 0433  HGBA1C 10.3*   CBG: Recent Labs  Lab 02/20/23 1122 02/20/23 1600 02/20/23 2203 02/21/23 0744 02/21/23 1209  GLUCAP 262* 236* 208* 186* 191*   Lipid Profile: No results for input(s): "CHOL", "HDL", "LDLCALC", "TRIG", "CHOLHDL", "LDLDIRECT" in the last 72 hours. Thyroid Function Tests: No results for input(s): "TSH", "T4TOTAL", "FREET4", "T3FREE", "THYROIDAB" in the last 72 hours. Anemia Panel: No results for input(s): "VITAMINB12", "FOLATE", "FERRITIN", "TIBC", "IRON", "RETICCTPCT" in the last 72 hours. Most Recent Urinalysis On File:     Component Value Date/Time   COLORURINE YELLOW (A) 02/19/2023 0546   APPEARANCEUR CLEAR (A) 02/19/2023 0546   APPEARANCEUR Clear 10/30/2013 1230   LABSPEC 1.023 02/19/2023 0546   LABSPEC 1.005 10/30/2013 1230   PHURINE 6.0 02/19/2023 0546   GLUCOSEU >=500 (A) 02/19/2023 0546   GLUCOSEU Negative 10/30/2013 1230   HGBUR NEGATIVE 02/19/2023 0546   BILIRUBINUR NEGATIVE 02/19/2023 0546   BILIRUBINUR Negative 10/30/2013 1230   KETONESUR NEGATIVE 02/19/2023 0546   PROTEINUR 100 (A) 02/19/2023 0546   NITRITE NEGATIVE 02/19/2023 0546   LEUKOCYTESUR NEGATIVE 02/19/2023 0546   LEUKOCYTESUR Negative 10/30/2013 1230   Sepsis Labs: @LABRCNTIP (procalcitonin:4,lacticidven:4) Microbiology: Recent Results (from the past 240 hour(s))  Culture, blood (routine x 2)     Status: None (Preliminary result)   Collection Time: 02/18/23  1:01 PM   Specimen: BLOOD  Result Value Ref Range Status   Specimen Description BLOOD BLOOD RIGHT ARM  Final   Special Requests   Final    BOTTLES  DRAWN AEROBIC AND ANAEROBIC Blood Culture adequate volume   Culture   Final    NO GROWTH 3 DAYS Performed at [facility], [address], [place], [place] [address]    Report Status PENDING  Incomplete  SARS Coronavirus 2 by RT PCR (hospital order, performed in [facility] hospital lab) *cepheid single result test* Anterior Nasal Swab     Status: Abnormal   Collection Time: 02/18/23  2:29 PM   Specimen: Anterior Nasal Swab  Result Value Ref Range Status   SARS Coronavirus 2 by RT PCR POSITIVE (A) NEGATIVE Final    Comment: (NOTE) SARS-CoV-2 target nucleic acids are DETECTED  SARS-CoV-2 RNA is generally detectable in upper respiratory specimens  during the acute phase of infection.  Positive results are indicative  of the presence of the identified virus, but do not rule out bacterial infection or co-infection with other pathogens not detected by the test.  Clinical correlation with patient history and  other diagnostic information is necessary to determine patient infection status.  The expected result is negative.  Fact Sheet for Patients:   RoadLapTop.co.za   Fact Sheet for Healthcare Providers:   http://kim-miller.com/    This test is not yet approved or cleared by the [place] FDA and  has been authorized for detection and/or diagnosis of SARS-CoV-2 by FDA under an Emergency Use Authorization (EUA).  This EUA will remain in effect (meaning this test can  be used) for the duration of  the COVID-19 declaration under Section 564(b)(1)  of the Act, 21 U.S.C. section 360-bbb-3(b)(1), unless the authorization is terminated or revoked sooner.   Performed at [facility], [address] [place], [place] [address]   Culture, blood (routine x 2)     Status: None (Preliminary result)   Collection Time: 02/18/23  3:16 PM   Specimen: BLOOD  Result Value Ref Range Status   Specimen Description BLOOD BLOOD LEFT  HAND  Final   Special Requests   Final    BOTTLES DRAWN AEROBIC AND ANAEROBIC Blood Culture adequate volume   Culture   Final    NO GROWTH 3 DAYS Performed at [facility], [address], [place], [place] [address]    Report Status PENDING  Incomplete  MRSA Next Gen by PCR, Nasal     Status: None   Collection Time: 02/18/23  5:59 PM   Specimen: Nasal Mucosa; Nasal Swab  Result Value Ref Range Status   MRSA by PCR Next Gen NOT DETECTED NOT DETECTED Final    Comment: (NOTE) The GeneXpert MRSA Assay (FDA approved for NASAL specimens only), is one component of a comprehensive MRSA colonization surveillance program. It is not intended to diagnose MRSA infection nor to guide or monitor treatment for MRSA infections. Test performance is not FDA approved in patients less than 33 years old. Performed at [facility], [address] [place], [place] [address]   Respiratory (~20 pathogens) panel by PCR     Status: None   Collection Time: 02/19/23  6:04 AM   Specimen: Nasopharyngeal Swab; Respiratory  Result Value Ref Range Status   Adenovirus NOT DETECTED NOT DETECTED Final   Coronavirus 229E NOT DETECTED NOT DETECTED Final    Comment: (NOTE) The Coronavirus on the Respiratory Panel, DOES NOT test for the novel  Coronavirus (2019 nCoV)    Coronavirus HKU1 NOT DETECTED NOT DETECTED Final   Coronavirus NL63 NOT DETECTED NOT DETECTED Final   Coronavirus OC43 NOT DETECTED NOT DETECTED Final   Metapneumovirus NOT DETECTED NOT DETECTED Final   Rhinovirus / Enterovirus NOT DETECTED NOT DETECTED Final   Influenza A NOT DETECTED NOT DETECTED Final   Influenza B NOT DETECTED NOT DETECTED Final   Parainfluenza Virus 1 NOT DETECTED NOT DETECTED Final   Parainfluenza Virus 2 NOT DETECTED NOT DETECTED Final   Parainfluenza Virus 3 NOT DETECTED NOT DETECTED Final   Parainfluenza Virus 4 NOT DETECTED NOT DETECTED Final   Respiratory Syncytial Virus NOT DETECTED NOT DETECTED  Final   Bordetella pertussis NOT DETECTED NOT DETECTED Final   Bordetella Parapertussis NOT DETECTED NOT DETECTED Final   Chlamydophila pneumoniae NOT DETECTED NOT DETECTED Final   Mycoplasma pneumoniae NOT DETECTED NOT DETECTED Final    Comment: Performed at [facility], [address], [place], [place] [address]  Culture, Respiratory w Gram Stain     Status: None (Preliminary result)   Collection Time: 02/19/23 11:27 AM   Specimen: Tracheal Aspirate; Respiratory  Result Value Ref Range Status   Specimen Description   Final    TRACHEAL ASPIRATE Performed at [facility], [address] [place], [place] [address]    Special Requests   Final    NONE Performed at [facility], [address] [place], [place] [address]    Gram Stain   Final    ABUNDANT WBC PRESENT, PREDOMINANTLY PMN RARE GRAM NEGATIVE RODS RARE GRAM POSITIVE COCCI RARE GRAM POSITIVE  RODS    Culture   Final    FEW SERRATIA MARCESCENS FEW PSEUDOMONAS AERUGINOSA SUSCEPTIBILITIES TO FOLLOW Performed at [facility], [address], [place], [place] [address]    Report Status PENDING  Incomplete      Radiology Studies last 3 days: DG Chest Portable 1 View  Result Date: 02/18/2023 CLINICAL DATA:  Shortness of breath. EXAM: PORTABLE CHEST 1 VIEW COMPARISON:  December 31, 2022. FINDINGS: Stable cardiomediastinal silhouette. Tracheostomy tube is in grossly good position. Mild bibasilar subsegmental atelectasis or infiltrates are noted, right greater than left. Small bilateral pleural effusions are noted. Bony thorax is unremarkable. IMPRESSION: Mild bibasilar subsegmental atelectasis or infiltrates are noted, right greater than left. Small bilateral pleural effusions. Electronically Signed   By: Lupita Raider M.D.   On: 02/18/2023 14:50             LOS: 3 days      Michelle Nielsen, DO [facility] 02/21/2023, 2:19 PM    Dictation software may have been used to  generate the above note. Typos may occur and escape review in typed/dictated notes. Please contact Dr Lyn Hollingshead directly for clarity if needed.  Staff may message me via secure chat in Epic  but this may not receive an immediate response,  please page me for urgent matters!  If 7PM-7AM, please contact night coverage [email or web address]

## 2023-02-21 NOTE — Progress Notes (Signed)
Speech Language Pathology Treatment: Dysphagia  Patient Details Name: Michelle [name] MRN: [id number] DOB: [birth year] Today's Date: 02/21/2023 Time: 3244-0102 SLP Time Calculation (min) (ACUTE ONLY): 10 min  Assessment / Plan / Recommendation Clinical Impression  Pt seen for diet tolerance. Per SLP observation and pt/nursing report, pt tolerating current diet without overt s/sx pharyngeal dysphagia. Pt with mildly prolonged, but functional, mastication of regular solids. Pharyngeal swallow appeared unremarkable per clinical assessment. Pt indep in use of safe swallowing strategies/aspiration precautions with exception for need for initial assistance for repositioning in a more upright position. Recommend continuation of a mech soft diet (in effort to conserve energy) and thin liquids with safe swallowing strategies/aspiration precautions as outlined below. SLP to sign off as pt has no acute SLP needs at this time.   HPI HPI: Pt is a 62 year old female who presented to the ED with c/o difficulty breathing and thick/yellow mucus. 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 CXR, 02/18/23, "Mild bibasilar subsegmental atelectasis or infiltrates are noted,  right greater than left. Small bilateral pleural effusions."      SLP Plan  All goals met      Recommendations for follow up therapy are one component of a multi-disciplinary discharge planning process, led by the attending physician.  Recommendations may be updated based on patient status, additional functional criteria and insurance authorization.    Recommendations  Diet recommendations: Dysphagia 3 (mechanical soft);Thin  liquid Medication Administration:  (as tolerated) Supervision: Patient able to self feed Compensations: Slow rate;Small sips/bites (rest breaks PRN) Postural Changes and/or Swallow Maneuvers: Upright 30-60 min after meal (upright as possible for POs)                  Oral care BID (set up/supervision if needed for management of vent tubing)    (defer to MD) Dysphagia, unspecified (R13.10)     All goals met    Clyde Canterbury, M.S., CCC-SLP Speech-Language Pathologist [facility] [phone] Arnette Felts)  Woodroe Chen  02/21/2023, 12:13 PM

## 2023-02-22 ENCOUNTER — Telehealth: Payer: Self-pay | Admitting: Adult Health

## 2023-02-22 DIAGNOSIS — U071 COVID-19: Secondary | ICD-10-CM | POA: Diagnosis not present

## 2023-02-22 LAB — BASIC METABOLIC PANEL
Anion gap: 10 (ref 5–15)
BUN: 24 mg/dL — ABNORMAL HIGH (ref 8–23)
CO2: 24 mmol/L (ref 22–32)
Calcium: 8.7 mg/dL — ABNORMAL LOW (ref 8.9–10.3)
Chloride: 105 mmol/L (ref 98–111)
Creatinine, Ser: 0.66 mg/dL (ref 0.44–1.00)
GFR, Estimated: >60 mL/min (ref >60)
Glucose, Bld: 188 mg/dL — ABNORMAL HIGH (ref 70–99)
Potassium: 3.8 mmol/L (ref 3.5–5.1)
Sodium: 139 mmol/L (ref 135–145)

## 2023-02-22 LAB — GLUCOSE, CAPILLARY
Glucose-Capillary: 181 mg/dL — ABNORMAL HIGH (ref 70–99)
Glucose-Capillary: 170 mg/dL — ABNORMAL HIGH (ref 70–99)
Glucose-Capillary: 303 mg/dL — ABNORMAL HIGH (ref 70–99)

## 2023-02-22 LAB — CULTURE, RESPIRATORY W GRAM STAIN

## 2023-02-22 MED ORDER — BENZONATATE 100 MG PO CAPS
100.0000 mg | ORAL_CAPSULE | Freq: Three times a day (TID) | ORAL | 0 refills | Status: DC | PRN
Start: 2023-02-22 — End: 2023-09-02

## 2023-02-22 MED ORDER — MELATONIN 5 MG PO TABS
5.0000 mg | ORAL_TABLET | Freq: Every evening | ORAL | Status: DC | PRN
  Administered 2023-02-22: 5 mg via ORAL
  Filled 2023-02-22: qty 1

## 2023-02-22 MED ORDER — PREDNISONE 50 MG PO TABS: 50.0000 mg | ORAL_TABLET | Freq: Every day | ORAL | 0 refills | Status: AC

## 2023-02-22 MED ORDER — INSULIN GLARGINE-YFGN 100 UNIT/ML ~~LOC~~ SOLN: 10.0000 [IU] | Freq: Every day | SUBCUTANEOUS | Status: DC

## 2023-02-22 MED ORDER — NIRMATRELVIR/RITONAVIR (PAXLOVID)TABLET: 3.0000 | ORAL_TABLET | Freq: Two times a day (BID) | ORAL | 0 refills | Status: AC

## 2023-02-22 MED ORDER — INSULIN GLARGINE-YFGN 100 UNIT/ML ~~LOC~~ SOLN
2.0000 [IU] | Freq: Once | SUBCUTANEOUS | Status: AC
  Administered 2023-02-22: 2 [IU] via SUBCUTANEOUS
  Filled 2023-02-22: qty 0.02

## 2023-02-22 MED ORDER — CVS BLOOD GLUCOSE METER W/DEVICE KIT: PACK | 0 refills | Status: AC

## 2023-02-22 NOTE — Inpatient Diabetes Management (Addendum)
Inpatient Diabetes Program Recommendations  AACE/ADA: New Consensus Statement on Inpatient Glycemic Control (2015)  Target Ranges:  Prepandial:   less than 140 mg/dL      Peak postprandial:   less than 180 mg/dL (1-2 hours)      Critically ill patients:  140 - 180 mg/dL    Latest Reference Range & Units 02/19/23 04:33  Hemoglobin A1C 4.8 - 5.6 % 10.3 (H)  249 mg/dl  (H): Data is abnormally high  Latest Reference Range & Units 02/21/23 07:44 02/21/23 12:09 02/21/23 16:33 02/21/23 21:27  Glucose-Capillary 70 - 99 mg/dL 629 (H)  3 units Novolog @0933  191 (H)  3 units Novolog  8 units Semglee  236 (H)  5 units Novolog  314 (H)  4 units Novolog   (H): Data is abnormally high  Latest Reference Range & Units 02/22/23 07:58  Glucose-Capillary 70 - 99 mg/dL 528 (H)  3 units Novolog  8 units Semglee  (H): Data is abnormally high   Admit with:  Sepsis Likely d/t COVID Acute on chronic respiratory failure with hypoxia Likely d/t COVID, COPD, question CAP   History: DM, Chronic Trach  Home DM Meds: Lantus 10 units at bedtime        Novolog SSI TID        Trulicity 4.5 mg Qweek         Current Orders: Semglee 8 units Daily     Novolog Moderate Correction Scale/ SSI (0-15 units) TID AC + HS     MD- Note pt getting Prednisone 50 mg daily.  AM CBG today 181--Afternoon CBGs elevated  Please consider:  1. Increase Semglee slightly to 10 units Daily (home dose) Please give extra 2 units Semglee X 1 dose today since 8 unit dose already given  2. Start Novolog Meal Coverage: Novolog 3 units TID with meals HOLD if pt NPO HOLD if pt eats <50% meals    Addendum 12:30pm--Pt gave me permission to speak with her Dtr about her home DM care regimen.  Spoke w/ Dtr on phone this AM.  Dtr told me pt has all her insulin and supplies at home.  States pt's CBG meter has been giving them trouble for several weeks and they have a hard time getting readings.  Sent request to Attending MD  for new CBG rx at time of d/c.  Reviewed pt's insulin regimen at home with Dtr as Dtr helps pt take the insulin.  Dtr told me the Lantus dose was increased to 40 units Daily in June and pt has Novolog SSI at home that start coverage at 151.  Pt also taking the Trulicity once weekly.  Reviewed Current A1c with Dtr and also reviewed goal A1c and goal CBGs for home.  Dtr told me she plans to make f/u appt with PCP soon after d/c.      --Will follow patient during hospitalization--  Ambrose Finland RN, MSN, CDCES Diabetes Coordinator Inpatient Glycemic Control Team Team Pager: [phone] (8a-5p)

## 2023-02-22 NOTE — Plan of Care (Signed)
Discussed transfer home, plan of care and medications prior to leaving with some teach back displayed.  Patients daughter called and was updated when [facility] was here to pick her up.  Patient received her klonopin and paxlovid medications prior to leaving.  She was sent home with paperwork, Paxlovid and Nasal spray.  Patient left via stretcher alert and oriented to person, place, time and situation.  She was taken on her personal ventilator which was battery operated.  IV removed prior to discharge.

## 2023-02-22 NOTE — Plan of Care (Signed)
  Problem: Fluid Volume: Goal: Hemodynamic stability will improve Outcome: Progressing   Problem: Respiratory: Goal: Ability to maintain adequate ventilation will improve Outcome: Progressing   Problem: Skin Integrity: Goal: Risk for impaired skin integrity will decrease Outcome: Progressing   Problem: Education: Goal: Knowledge of General Education information will improve Description: Including pain rating scale, medication(s)/side effects and non-pharmacologic comfort measures Outcome: Progressing   Problem: Respiratory: Goal: Will maintain a patent airway Outcome: Progressing

## 2023-02-22 NOTE — Telephone Encounter (Signed)
Terrry states patient needs 1 to 2 week hospital follow up with Rubye Oaks NP. Terry phone number is [phone].

## 2023-02-22 NOTE — Discharge Summary (Signed)
Physician Discharge Summary   Patient: Michelle [name] MRN: [id number]  DOB: Dec 15, 1960   Admit:     Date of Admission: 02/18/2023 Admitted from: home   Discharge: Date of discharge: 02/22/23 Disposition: Home health Condition at discharge: fair  CODE STATUS: DNR     Discharge Physician: Sunnie Nielsen, DO [facility]     PCP: Abram Sander, MD  Recommendations for Outpatient Follow-up:  Follow up with PCP Abram Sander, MD in 1-2 weeks Follow up with pulmonology in 1-2 weeks  Please obtain labs/tests: BMP, CBC in 1-2 weeks Please follow up on the following pending results: none PCP AND OTHER OUTPATIENT PROVIDERS: SEE BELOW FOR SPECIFIC DISCHARGE INSTRUCTIONS PRINTED FOR PATIENT IN ADDITION TO GENERIC AVS PATIENT INFO   Discharge Instructions     Diet - low sodium heart healthy   Complete by: As directed    Diet Carb Modified   Complete by: As directed    Increase activity slowly   Complete by: As directed          Discharge Diagnoses: Principal Problem:   COVID-19 virus infection Active Problems:   Community acquired pneumonia   Chronic respiratory failure requiring continuous mechanical ventilation through tracheostomy (HCC)   Hypokalemia   Depression with anxiety   Hyperlipidemia associated with type 2 diabetes mellitus (HCC)   COPD (chronic obstructive pulmonary disease) (HCC)   Tobacco abuse   Sepsis (HCC)   Protein-calorie malnutrition, severe   Acute on chronic respiratory failure with hypoxia (HCC)   CAD (coronary artery disease)   HTN (hypertension)   Chronic diastolic CHF (congestive heart failure) (HCC)   DM (diabetes mellitus) (HCC)   Atypical pneumonia   Tracheostomy status (HCC)   Dysuria   Elevated troponin   Obesity (BMI 30-39.9)       Hospital Course: Michelle [name] is a 62 year old female with history of MRSA and Pseudomonas pneumonia, severe atypical pneumonia, COPD, on chronic trach with vent at home,  history of cocaine abuse, congestive heart failure, hyperlipidemia, hypertension, history of tobacco use, history of left local axilla abscess, emphysema, coronary artery disease, anxiety, asthma, depression, insulin-dependent diabetes mellitus, history of morbid obesity, who presents to the emergency department for chief concerns of difficulty breathing, thick yellow mucus. Tmax of 100.8 at home and has taken Tylenol prior to ED presentation. 09/16: ED administered cefepime 2 g IV one-time dose, vancomycin 2 g IV one-time dose. Admitted to hospitalist service for sepsis d/t COVID.  09/17-09/19: continuing treatment for COPD/COVID. Persistent increased WOB is improving into 09/19 and back on home vent setting by evening 09/19 09/20: remians stable on home vent settings and SOB improved to close to baseline, stable for discharge home   Consultants:  none  Procedures: none      ASSESSMENT & PLAN:   Sepsis Likely d/t COVID Treat underlying causes as below   Acute on chronic respiratory failure with hypoxia  Likely d/t COVID, COPD, question CAP  Presumed secondary to atypical/community-acquired pneumonia in setting of patient with history of MRSA and Pseudomonas pneumonia with chronic tracheostomy Treat underlying causes as below   COVID IV to po steroids  Paxlovid given sx <5d and severe illness   Questionable Bacterial Community Acquired Pneumonia, more likely viral PNA d/t COVID  Have d/c vancomycin/doxycycline, completed 5 days cefepime  COPD exacerbation d/t COVID  IV to po steroids  Home inhalers/nebs as below   Hypokalemia Replace as needed Monitor BMP   Depression with anxiety Panic  Home paroxetine 40 mg every morning, mirtazapine 30 mg nightly, hydroxyzine 25 mg p.o. every 6 hours as needed for anxiety, itching resumed on admission Home Clonazepam ordered tid and dose increased from 0.5 mg to 1 mg while inpatient but back to home dose on discharge    Elevated  troponin Secondary to pneumonia in setting of chronic tracheostomy causing demand ischemia Chest pain with coughing and on breathing Low clinical suspicion for ACS at this time Monitor as needed   Dysuria And polydipsia, present on admission UA not concerning for UTI Monitor    Tracheostomy status (HCC) Chronic tracheostomy and ventilation at home Consult placed to respiratory care treatment team Inpatient, stepdown admission Tracheal aspirate cultured    DM (diabetes mellitus) (HCC) Insulin-dependent diabetes mellitus resume home long-acting insulin on discharge    HTN (hypertension) Home amlodipine 5 mg daily and weekly clonidine patch resumed on admission  IV labetalol prn    CAD (coronary artery disease) Home aspirin 81 mg daily resumed    Protein-calorie malnutrition, severe Consult placed to registered dietitian   Tobacco abuse Patient no longer uses tobacco products              Discharge Instructions  Allergies as of 02/22/2023       Reactions   Amoxicillin    Patient has tolerated cephalosporins in the past   Ativan [lorazepam]    Makes anxiety worse         Medication List     STOP taking these medications    atorvastatin 40 MG tablet Commonly known as: LIPITOR   gabapentin 100 MG capsule Commonly known as: NEURONTIN   hydrOXYzine 25 MG tablet Commonly known as: ATARAX   Iron 325 (65 Fe) MG Tabs   promethazine 12.5 MG tablet Commonly known as: PHENERGAN       TAKE these medications    albuterol 108 (90 Base) MCG/ACT inhaler Commonly known as: VENTOLIN HFA Inhale 2 puffs into the lungs every 6 (six) hours as needed for wheezing or shortness of breath.   amLODipine 5 MG tablet Commonly known as: NORVASC Take 5 mg by mouth daily.   aspirin 81 MG chewable tablet Chew 81 mg by mouth daily.   azelastine 0.1 % nasal spray Commonly known as: ASTELIN SMARTSIG:1-2 Spray(s) Both Nares Every 12 Hours PRN   benzonatate 100  MG capsule Commonly known as: Tessalon Perles Take 1-2 capsules (100-200 mg total) by mouth 3 (three) times daily as needed for cough.   cetirizine 10 MG tablet Commonly known as: ZYRTEC Take 10 mg by mouth daily.   clonazePAM 0.5 MG tablet Commonly known as: KLONOPIN Take 0.5 mg by mouth in the morning, at noon, and at bedtime.   cloNIDine 0.1 mg/24hr patch Commonly known as: CATAPRES - Dosed in mg/24 hr Place 0.1 mg onto the skin once a week.   [employer] Blood Glucose Meter w/Device Kit Use as directed to monitor blood glucose up to qid   D3-50 1.25 MG (50000 UT) capsule Generic drug: Cholecalciferol Take 50,000 Units by mouth once a week.   fluticasone 50 MCG/ACT nasal spray Commonly known as: FLONASE Place 1 spray into both nostrils daily.   guaiFENesin 600 MG 12 hr tablet Commonly known as: MUCINEX Take 600 mg by mouth 2 (two) times daily.   hydrOXYzine 25 MG capsule Commonly known as: VISTARIL Take 25 mg by mouth every 6 (six) hours as needed.   ipratropium-albuterol 0.5-2.5 (3) MG/3ML Soln Commonly known as: DUONEB Take 3 mLs  by nebulization every 6 (six) hours. What changed:  when to take this reasons to take this   Lantus SoloStar 100 UNIT/ML Solostar Pen Generic drug: insulin glargine Inject 10 Units into the skin at bedtime.   mirtazapine 30 MG tablet Commonly known as: REMERON Take 30 mg by mouth at bedtime.   mupirocin ointment 2 % Commonly known as: BACTROBAN Apply 1 Application topically daily. To inside nose and to affected area of axilla   naloxone 4 MG/0.1ML Liqd nasal spray kit Commonly known as: NARCAN Place 1 spray into the nose once.   nirmatrelvir/ritonavir 20 x 150 MG & 10 x 100MG  Tabs Commonly known as: PAXLOVID Take 3 tablets by mouth 2 (two) times daily for 2 days. Take nirmatrelvir (150 mg) two tablets twice daily for 5 days and ritonavir (100 mg) one tablet twice daily for 2 days for total 5 day course started in hospital Start  taking on: February 23, 2023   NovoLOG FlexPen 100 UNIT/ML FlexPen Generic drug: insulin aspart Inject into the skin 3 (three) times daily with meals. Sliding scale mealtime insulin.   nystatin ointment Commonly known as: MYCOSTATIN Apply 1 Application topically 3 (three) times daily.   oxyCODONE-acetaminophen 5-325 MG tablet Commonly known as: PERCOCET/ROXICET Take 1 tablet by mouth 3 (three) times daily as needed for pain.   PARoxetine 40 MG tablet Commonly known as: PAXIL Take 40 mg by mouth every morning. Give 30 mg x 1 week (1.5 tab of 20 mg) then start 40 mg daily. D/c 20 mg pills at that time.   polyethylene glycol powder 17 GM/SCOOP powder Commonly known as: GLYCOLAX/MIRALAX Take by mouth.   predniSONE 50 MG tablet Commonly known as: DELTASONE Take 1 tablet (50 mg total) by mouth daily with breakfast for 3 days. Start taking on: February 23, 2023 What changed:  medication strength how much to take when to take this   Pulmicort 0.5 MG/2ML nebulizer solution Generic drug: budesonide SMARTSIG:1 Via Nebulizer Twice Daily   terbinafine 1 % cream Commonly known as: LAMISIL Apply 1 application topically 2 (two) times daily. To affected skin, for 1 week after resolution of rash. # 30 gm, 1 refill.   Trulicity 4.5 MG/0.5ML Sopn Generic drug: Dulaglutide Inject 4.5 mg into the skin once a week.   Vitamin D (Ergocalciferol) 1.25 MG (50000 UNIT) Caps capsule Commonly known as: DRISDOL Take 50,000 Units by mouth 2 (two) times a week.         Follow-up Information     Adamo, Jeris Penta, MD. Schedule an appointment as soon as possible for a visit.   Specialty: Family Medicine Why: hospital follow up in 1-2 weeks Contact information: [address] [place] [address] [phone]         Julio Sicks, NP. Schedule an appointment as soon as possible for a visit.   Specialty: Pulmonary Disease Why: hospital follow up in 1-2 weeks Contact  information: [address] [place] [address] [phone]                 Allergies  Allergen Reactions   Amoxicillin     Patient has tolerated cephalosporins in the past   Ativan [Lorazepam]     Makes anxiety worse      Subjective: pt reports breathing better, denies chest pain, reports some cough with deep breathing    Discharge Exam: BP 124/75   Pulse 72   Temp 98.1 F (36.7 C) (Oral)   Resp 14  Ht 5' 0.98" (1.549 m)   Wt 72.4 kg   LMP 03/06/2005 (Approximate)   SpO2 99%   BMI 30.17 kg/m  General: Pt is alert, awake, not in acute distress, trach in place on home vent device  Cardiovascular: RRR, S1/S2  Respiratory: scattered rhonchi but improved from previous exam  Abdominal: Soft, NT, ND, bowel sounds + Extremities: no edema, no cyanosis     The results of significant diagnostics from this hospitalization (including imaging, microbiology, ancillary and laboratory) are listed below for reference.     Microbiology: Recent Results (from the past 240 hour(s))  Culture, blood (routine x 2)     Status: None (Preliminary result)   Collection Time: 02/18/23  1:01 PM   Specimen: BLOOD  Result Value Ref Range Status   Specimen Description BLOOD BLOOD RIGHT ARM  Final   Special Requests   Final    BOTTLES DRAWN AEROBIC AND ANAEROBIC Blood Culture adequate volume   Culture   Final    NO GROWTH 4 DAYS Performed at [facility], [address], [place], [place] [address]    Report Status PENDING  Incomplete  SARS Coronavirus 2 by RT PCR (hospital order, performed in [facility] hospital lab) *cepheid single result test* Anterior Nasal Swab     Status: Abnormal   Collection Time: 02/18/23  2:29 PM   Specimen: Anterior Nasal Swab  Result Value Ref Range Status   SARS Coronavirus 2 by RT PCR POSITIVE (A) NEGATIVE Final    Comment: (NOTE) SARS-CoV-2 target nucleic acids are DETECTED  SARS-CoV-2 RNA is generally detectable in  upper respiratory specimens  during the acute phase of infection.  Positive results are indicative  of the presence of the identified virus, but do not rule out bacterial infection or co-infection with other pathogens not detected by the test.  Clinical correlation with patient history and  other diagnostic information is necessary to determine patient infection status.  The expected result is negative.  Fact Sheet for Patients:   RoadLapTop.co.za   Fact Sheet for Healthcare Providers:   http://kim-miller.com/    This test is not yet approved or cleared by the [place] FDA and  has been authorized for detection and/or diagnosis of SARS-CoV-2 by FDA under an Emergency Use Authorization (EUA).  This EUA will remain in effect (meaning this test can be used) for the duration of  the COVID-19 declaration under Section 564(b)(1)  of the Act, 21 U.S.C. section 360-bbb-3(b)(1), unless the authorization is terminated or revoked sooner.   Performed at [facility], [address] [place], [place] [address]   Culture, blood (routine x 2)     Status: None (Preliminary result)   Collection Time: 02/18/23  3:16 PM   Specimen: BLOOD  Result Value Ref Range Status   Specimen Description BLOOD BLOOD LEFT HAND  Final   Special Requests   Final    BOTTLES DRAWN AEROBIC AND ANAEROBIC Blood Culture adequate volume   Culture   Final    NO GROWTH 4 DAYS Performed at [facility], [address] [place], [place] [address]    Report Status PENDING  Incomplete  MRSA Next Gen by PCR, Nasal     Status: None   Collection Time: 02/18/23  5:59 PM   Specimen: Nasal Mucosa; Nasal Swab  Result Value Ref Range Status   MRSA by PCR Next Gen NOT DETECTED NOT DETECTED Final    Comment: (NOTE) The GeneXpert MRSA Assay (FDA approved for NASAL  specimens only), is one component of a comprehensive MRSA colonization surveillance program. It is  not intended to diagnose MRSA infection nor to guide or monitor treatment for MRSA infections. Test performance is not FDA approved in patients less than 36 years old. Performed at [facility], [address] [place], [place] [address]   Respiratory (~20 pathogens) panel by PCR     Status: None   Collection Time: 02/19/23  6:04 AM   Specimen: Nasopharyngeal Swab; Respiratory  Result Value Ref Range Status   Adenovirus NOT DETECTED NOT DETECTED Final   Coronavirus 229E NOT DETECTED NOT DETECTED Final    Comment: (NOTE) The Coronavirus on the Respiratory Panel, DOES NOT test for the novel  Coronavirus (2019 nCoV)    Coronavirus HKU1 NOT DETECTED NOT DETECTED Final   Coronavirus NL63 NOT DETECTED NOT DETECTED Final   Coronavirus OC43 NOT DETECTED NOT DETECTED Final   Metapneumovirus NOT DETECTED NOT DETECTED Final   Rhinovirus / Enterovirus NOT DETECTED NOT DETECTED Final   Influenza A NOT DETECTED NOT DETECTED Final   Influenza B NOT DETECTED NOT DETECTED Final   Parainfluenza Virus 1 NOT DETECTED NOT DETECTED Final   Parainfluenza Virus 2 NOT DETECTED NOT DETECTED Final   Parainfluenza Virus 3 NOT DETECTED NOT DETECTED Final   Parainfluenza Virus 4 NOT DETECTED NOT DETECTED Final   Respiratory Syncytial Virus NOT DETECTED NOT DETECTED Final   Bordetella pertussis NOT DETECTED NOT DETECTED Final   Bordetella Parapertussis NOT DETECTED NOT DETECTED Final   Chlamydophila pneumoniae NOT DETECTED NOT DETECTED Final   Mycoplasma pneumoniae NOT DETECTED NOT DETECTED Final    Comment: Performed at [facility], [address] [place], [place] [address]  Culture, Respiratory w Gram Stain     Status: None   Collection Time: 02/19/23 11:27 AM   Specimen: Tracheal Aspirate; Respiratory  Result Value Ref Range Status   Specimen Description   Final    TRACHEAL ASPIRATE Performed at [facility], [address] [place], [place] [address]    Special Requests    Final    NONE Performed at [facility], [address] [place], [place] [address]    Gram Stain   Final    ABUNDANT WBC PRESENT, PREDOMINANTLY PMN RARE GRAM NEGATIVE RODS RARE GRAM POSITIVE COCCI RARE GRAM POSITIVE RODS Performed at [name], [address], [place], [place] [address]    Culture   Final    FEW SERRATIA MARCESCENS FEW PSEUDOMONAS AERUGINOSA    Report Status 02/22/2023 FINAL  Final   Organism ID, Bacteria SERRATIA MARCESCENS  Final   Organism ID, Bacteria PSEUDOMONAS AERUGINOSA  Final      Susceptibility   Pseudomonas aeruginosa - MIC*    CEFTAZIDIME 2 SENSITIVE Sensitive     CIPROFLOXACIN 2 RESISTANT Resistant     GENTAMICIN 4 SENSITIVE Sensitive     IMIPENEM 1 SENSITIVE Sensitive     PIP/TAZO <=4 SENSITIVE Sensitive     * FEW PSEUDOMONAS AERUGINOSA   Serratia marcescens - MIC*    CEFEPIME <=0.12 SENSITIVE Sensitive     CEFTAZIDIME <=1 SENSITIVE Sensitive     CEFTRIAXONE <=0.25 SENSITIVE Sensitive     CIPROFLOXACIN <=0.25 SENSITIVE Sensitive     GENTAMICIN <=1 SENSITIVE Sensitive     TRIMETH/SULFA <=20 SENSITIVE Sensitive     * FEW SERRATIA MARCESCENS     Labs: BNP (last 3 results) No results for input(s): "BNP" in the last 8760 hours. Basic Metabolic Panel:  Recent Labs  Lab 02/18/23 1310 02/18/23 2019 02/19/23 0433 02/20/23 0227 02/21/23 0250 02/22/23 0517  NA 132*  --  134* 137 138 139  K 3.2*  --  4.6 4.1 3.7 3.8  CL 96*  --  103 106 105 105  CO2 24  --  22 23 24 24   GLUCOSE 239*  --  250* 253* 228* 188*  BUN 18  --  22 25* 22 24*  CREATININE 0.70  --  0.66 0.74 0.72 0.66  CALCIUM 8.3*  --  8.6* 8.5* 8.6* 8.7*  MG  --  1.9  --   --   --   --    Liver Function Tests: Recent Labs  Lab 02/18/23 1310  AST 22  ALT 15  ALKPHOS 75  BILITOT 0.8  PROT 8.1  ALBUMIN 3.6   No results for input(s): "LIPASE", "AMYLASE" in the last 168 hours. No results for input(s): "AMMONIA" in the last 168 hours. CBC: Recent Labs   Lab 02/18/23 1310 02/19/23 0433  WBC 5.4 4.2  NEUTROABS 4.5  --   HGB 11.7* 12.5  HCT 37.7 39.7  MCV 81.8 80.5  PLT 211 237   Cardiac Enzymes: No results for input(s): "CKTOTAL", "CKMB", "CKMBINDEX", "TROPONINI" in the last 168 hours. BNP: Invalid input(s): "POCBNP" CBG: Recent Labs  Lab 02/21/23 1209 02/21/23 1633 02/21/23 2127 02/22/23 0758 02/22/23 1210  GLUCAP 191* 236* 314* 181* 170*   D-Dimer No results for input(s): "DDIMER" in the last 72 hours. Hgb A1c No results for input(s): "HGBA1C" in the last 72 hours. Lipid Profile No results for input(s): "CHOL", "HDL", "LDLCALC", "TRIG", "CHOLHDL", "LDLDIRECT" in the last 72 hours. Thyroid function studies No results for input(s): "TSH", "T4TOTAL", "T3FREE", "THYROIDAB" in the last 72 hours.  Invalid input(s): "FREET3" Anemia work up No results for input(s): "VITAMINB12", "FOLATE", "FERRITIN", "TIBC", "IRON", "RETICCTPCT" in the last 72 hours. Urinalysis    Component Value Date/Time   COLORURINE YELLOW (A) 02/19/2023 0546   APPEARANCEUR CLEAR (A) 02/19/2023 0546   APPEARANCEUR Clear 10/30/2013 1230   LABSPEC 1.023 02/19/2023 0546   LABSPEC 1.005 10/30/2013 1230   PHURINE 6.0 02/19/2023 0546   GLUCOSEU >=500 (A) 02/19/2023 0546   GLUCOSEU Negative 10/30/2013 1230   HGBUR NEGATIVE 02/19/2023 0546   BILIRUBINUR NEGATIVE 02/19/2023 0546   BILIRUBINUR Negative 10/30/2013 1230   KETONESUR NEGATIVE 02/19/2023 0546   PROTEINUR 100 (A) 02/19/2023 0546   NITRITE NEGATIVE 02/19/2023 0546   LEUKOCYTESUR NEGATIVE 02/19/2023 0546   LEUKOCYTESUR Negative 10/30/2013 1230   Sepsis Labs Recent Labs  Lab 02/18/23 1310 02/19/23 0433  WBC 5.4 4.2   Microbiology Recent Results (from the past 240 hour(s))  Culture, blood (routine x 2)     Status: None (Preliminary result)   Collection Time: 02/18/23  1:01 PM   Specimen: BLOOD  Result Value Ref Range Status   Specimen Description BLOOD BLOOD RIGHT ARM  Final   Special  Requests   Final    BOTTLES DRAWN AEROBIC AND ANAEROBIC Blood Culture adequate volume   Culture   Final    NO GROWTH 4 DAYS Performed at [facility], [address] [place], [place] [address]    Report Status PENDING  Incomplete  SARS Coronavirus 2 by RT PCR (hospital order, performed in [facility] hospital lab) *cepheid single result test* Anterior Nasal Swab     Status: Abnormal   Collection Time: 02/18/23  2:29 PM   Specimen: Anterior Nasal Swab  Result Value Ref Range  Status   SARS Coronavirus 2 by RT PCR POSITIVE (A) NEGATIVE Final    Comment: (NOTE) SARS-CoV-2 target nucleic acids are DETECTED  SARS-CoV-2 RNA is generally detectable in upper respiratory specimens  during the acute phase of infection.  Positive results are indicative  of the presence of the identified virus, but do not rule out bacterial infection or co-infection with other pathogens not detected by the test.  Clinical correlation with patient history and  other diagnostic information is necessary to determine patient infection status.  The expected result is negative.  Fact Sheet for Patients:   RoadLapTop.co.za   Fact Sheet for Healthcare Providers:   http://kim-miller.com/    This test is not yet approved or cleared by the [place] FDA and  has been authorized for detection and/or diagnosis of SARS-CoV-2 by FDA under an Emergency Use Authorization (EUA).  This EUA will remain in effect (meaning this test can be used) for the duration of  the COVID-19 declaration under Section 564(b)(1)  of the Act, 21 U.S.C. section 360-bbb-3(b)(1), unless the authorization is terminated or revoked sooner.   Performed at [facility], [address] [place], [place] [address]   Culture, blood (routine x 2)     Status: None (Preliminary result)   Collection Time: 02/18/23  3:16 PM   Specimen: BLOOD  Result Value Ref Range Status   Specimen  Description BLOOD BLOOD LEFT HAND  Final   Special Requests   Final    BOTTLES DRAWN AEROBIC AND ANAEROBIC Blood Culture adequate volume   Culture   Final    NO GROWTH 4 DAYS Performed at [facility], [address], [place], [place] [address]    Report Status PENDING  Incomplete  MRSA Next Gen by PCR, Nasal     Status: None   Collection Time: 02/18/23  5:59 PM   Specimen: Nasal Mucosa; Nasal Swab  Result Value Ref Range Status   MRSA by PCR Next Gen NOT DETECTED NOT DETECTED Final    Comment: (NOTE) The GeneXpert MRSA Assay (FDA approved for NASAL specimens only), is one component of a comprehensive MRSA colonization surveillance program. It is not intended to diagnose MRSA infection nor to guide or monitor treatment for MRSA infections. Test performance is not FDA approved in patients less than 85 years old. Performed at [facility], [address] [place], [place] [address]   Respiratory (~20 pathogens) panel by PCR     Status: None   Collection Time: 02/19/23  6:04 AM   Specimen: Nasopharyngeal Swab; Respiratory  Result Value Ref Range Status   Adenovirus NOT DETECTED NOT DETECTED Final   Coronavirus 229E NOT DETECTED NOT DETECTED Final    Comment: (NOTE) The Coronavirus on the Respiratory Panel, DOES NOT test for the novel  Coronavirus (2019 nCoV)    Coronavirus HKU1 NOT DETECTED NOT DETECTED Final   Coronavirus NL63 NOT DETECTED NOT DETECTED Final   Coronavirus OC43 NOT DETECTED NOT DETECTED Final   Metapneumovirus NOT DETECTED NOT DETECTED Final   Rhinovirus / Enterovirus NOT DETECTED NOT DETECTED Final   Influenza A NOT DETECTED NOT DETECTED Final   Influenza B NOT DETECTED NOT DETECTED Final   Parainfluenza Virus 1 NOT DETECTED NOT DETECTED Final   Parainfluenza Virus 2 NOT DETECTED NOT DETECTED Final   Parainfluenza Virus 3 NOT DETECTED NOT DETECTED Final   Parainfluenza Virus 4 NOT DETECTED NOT DETECTED Final   Respiratory Syncytial Virus  NOT DETECTED NOT DETECTED Final  Bordetella pertussis NOT DETECTED NOT DETECTED Final   Bordetella Parapertussis NOT DETECTED NOT DETECTED Final   Chlamydophila pneumoniae NOT DETECTED NOT DETECTED Final   Mycoplasma pneumoniae NOT DETECTED NOT DETECTED Final    Comment: Performed at [facility], [address] [place], [place] [address]  Culture, Respiratory w Gram Stain     Status: None   Collection Time: 02/19/23 11:27 AM   Specimen: Tracheal Aspirate; Respiratory  Result Value Ref Range Status   Specimen Description   Final    TRACHEAL ASPIRATE Performed at [facility], [address] [place], [place] [address]    Special Requests   Final    NONE Performed at [facility], [address] [place], [place] [address]    Gram Stain   Final    ABUNDANT WBC PRESENT, PREDOMINANTLY PMN RARE GRAM NEGATIVE RODS RARE GRAM POSITIVE COCCI RARE GRAM POSITIVE RODS Performed at [facility], [address], [place], [place] [address]    Culture   Final    FEW SERRATIA MARCESCENS FEW PSEUDOMONAS AERUGINOSA    Report Status 02/22/2023 FINAL  Final   Organism ID, Bacteria SERRATIA MARCESCENS  Final   Organism ID, Bacteria PSEUDOMONAS AERUGINOSA  Final      Susceptibility   Pseudomonas aeruginosa - MIC*    CEFTAZIDIME 2 SENSITIVE Sensitive     CIPROFLOXACIN 2 RESISTANT Resistant     GENTAMICIN 4 SENSITIVE Sensitive     IMIPENEM 1 SENSITIVE Sensitive     PIP/TAZO <=4 SENSITIVE Sensitive     * FEW PSEUDOMONAS AERUGINOSA   Serratia marcescens - MIC*    CEFEPIME <=0.12 SENSITIVE Sensitive     CEFTAZIDIME <=1 SENSITIVE Sensitive     CEFTRIAXONE <=0.25 SENSITIVE Sensitive     CIPROFLOXACIN <=0.25 SENSITIVE Sensitive     GENTAMICIN <=1 SENSITIVE Sensitive     TRIMETH/SULFA <=20 SENSITIVE Sensitive     * FEW SERRATIA MARCESCENS   Imaging DG Chest Portable 1 View  Result Date: 02/18/2023 CLINICAL DATA:  Shortness of breath. EXAM: PORTABLE CHEST 1 VIEW  COMPARISON:  December 31, 2022. FINDINGS: Stable cardiomediastinal silhouette. Tracheostomy tube is in grossly good position. Mild bibasilar subsegmental atelectasis or infiltrates are noted, right greater than left. Small bilateral pleural effusions are noted. Bony thorax is unremarkable. IMPRESSION: Mild bibasilar subsegmental atelectasis or infiltrates are noted, right greater than left. Small bilateral pleural effusions. Electronically Signed   By: Lupita Raider M.D.   On: 02/18/2023 14:50      Time coordinating discharge: over 30 minutes  SIGNED:  Sunnie Nielsen DO [facility]

## 2023-02-22 NOTE — Progress Notes (Signed)
Daughter called and notified of pt's plan to d/c per pt request, daughter will be here at 2 for d/c instruction review.

## 2023-02-22 NOTE — TOC Initial Note (Signed)
Transition of Care [facility]) - Initial/Assessment Note    Patient Details  Name: Michelle [name] MRN: [id number] Date of Birth: 03/08/1961  Transition of [facility]) CM/SW Contact:    Truddie Hidden, RN Phone Number: 02/22/2023, 1:58 PM  Clinical Narrative:                 Admitted for: COVID-19 Admitted from:Home. Lives with her daughter PCP: Beverely Low Pharmacy:[facility]  Current home health/prior home health/DME:O2 concentrator, Trilogy walker, WC, BSC Transportation: daughter takes to appointment  Spoke with patient's daughter, Raoul Pitch. Patient is visited by two [facility] agencies [employer] [phone] and [employer] [phone]  Spoke with Marcelino Duster from [facility]  to advised patient is discharging home. Discharge summary faxed to [phone] from [facility] @ [phone] to advise of patient's discharge home.      Barriers to Discharge: Barriers Resolved   Patient Goals and CMS Choice Patient states their goals for this hospitalization and ongoing recovery are:: home CMS Medicare.gov Compare Post Acute Care list provided to:: Patient        Expected Discharge Plan and Services       Living arrangements for the past 2 months: Single Family Home Expected Discharge Date: 02/22/23                                    Prior Living Arrangements/Services Living arrangements for the past 2 months: Single Family Home Lives with:: Adult Children   Do you feel safe going back to the place where you live?: Yes      Need for Family Participation in Patient Care: Yes (Comment) Care giver support system in place?: Yes (comment) Current home services: Home RN Criminal Activity/Legal Involvement Pertinent to Current Situation/Hospitalization: No - Comment as needed  Activities of Daily Living Home Assistive Devices/Equipment: Wheelchair, Blood pressure cuff, CBG Meter    Permission Sought/Granted                  Emotional Assessment Appearance:: Other (Comment Required  (daughter completed) Attitude/Demeanor/Rapport: Gracious, Engaged Affect (typically observed): Accepting   Alcohol / Substance Use: Not Applicable Psych Involvement: No (comment)  Admission diagnosis:  Ventilator associated pneumonia (HCC) [J95.851] Sepsis (HCC) [A41.9] Sepsis, due to unspecified organism, unspecified whether acute organ dysfunction present [facility]) [A41.9] Patient Active Problem List   Diagnosis Date Noted   Obesity (BMI 30-39.9) 02/19/2023   Dysuria 02/18/2023   Elevated troponin 02/18/2023   COVID-19 virus infection 02/18/2023   Tracheostomy status (HCC) 04/02/2022   Acute on chronic respiratory failure (HCC) 09/30/2021   Urine retention 02/07/2021   Candidiasis of breast 11/15/2020   Atypical pneumonia 08/29/2019   HCAP (healthcare-associated pneumonia) 08/28/2019   Pseudomonas respiratory infection 07/03/2019   Depression with anxiety 07/03/2019   Hyperlipidemia associated with type 2 diabetes mellitus (HCC) 07/03/2019   PEG (percutaneous endoscopic gastrostomy) status (HCC) 07/03/2019   Chronic pain 07/03/2019   Hypokalemia 07/03/2019   DM (diabetes mellitus) (HCC) 06/24/2019   Chronic respiratory failure requiring continuous mechanical ventilation through tracheostomy (HCC) 10/20/2018   CAD (coronary artery disease) 10/20/2018   HTN (hypertension) 10/20/2018   Chronic diastolic CHF (congestive heart failure) (HCC) 10/20/2018   Pneumonia 09/17/2018   Panic disorder 10/29/2017   Altered mental status    Pressure injury of skin 10/23/2017   Acute on chronic respiratory failure with hypoxia and hypercapnia (HCC) 06/29/2017  Acute on chronic respiratory failure with hypoxia (HCC) 02/23/2017   Protein-calorie malnutrition, severe 05/23/2016   Sepsis (HCC) 05/22/2016   Cocaine abuse (HCC) 04/07/2015   Malnutrition of moderate degree (HCC) 10/25/2014   COPD (chronic obstructive pulmonary disease) (HCC) 10/24/2014   Tobacco abuse 10/24/2014   Community  acquired pneumonia 10/24/2014   PCP:  Abram Sander, MD Pharmacy:   [facility] - [place], [place] - [address] [place] [address] Phone: [phone] Fax: [phone]  [facility] [address], [place] - [address] [place] [address] Phone: [phone] Fax: [phone]  [id number] - [place], [place] - [address] [place] [address] Phone: [phone] Fax: [phone]     Social Determinants of Health (SDOH) Social History: SDOH Screenings   Food Insecurity: Food Insecurity Present (02/18/2023)  Housing: Patient Declined (02/18/2023)  Transportation Needs: No Transportation Needs (02/18/2023)  Financial Resource Strain: Medium Risk (06/29/2017)  Physical Activity: Inactive (06/29/2017)  Social Connections: Unknown (06/29/2017)  Stress: Stress Concern Present (06/29/2017)  Tobacco Use: Medium Risk (02/18/2023)   SDOH Interventions:     Readmission Risk Interventions    02/22/2023    1:47 PM 10/03/2021    3:58 PM  Readmission Risk Prevention Plan  Transportation Screening Complete Complete  PCP or Specialist Appt within 3-5 Days Complete Complete  HRI or Home Care Consult Complete   Social Work Consult for Recovery Care Planning/Counseling Complete Complete  Palliative Care Screening Complete Not Applicable  Medication Review Oceanographer) Complete Complete

## 2023-02-22 NOTE — Telephone Encounter (Signed)
Patient is scheduled to see Dr. Belia Heman on 03/27/2023. Does she need to be seen before then?

## 2023-02-23 LAB — CULTURE, BLOOD (ROUTINE X 2)
Culture: NO GROWTH
Culture: NO GROWTH
Special Requests: ADEQUATE
Special Requests: ADEQUATE

## 2023-02-25 MED FILL — Nirmatrelvir Tab 10 x 150 MG & Ritonavir Tab 10 x 100 MG Pak: ORAL | Qty: 20 | Status: AC

## 2023-03-27 ENCOUNTER — Ambulatory Visit: Payer: Medicaid Other | Admitting: Pulmonary Disease

## 2023-03-27 ENCOUNTER — Encounter: Payer: Self-pay | Admitting: Pulmonary Disease

## 2023-03-27 ENCOUNTER — Telehealth: Payer: Self-pay | Admitting: Internal Medicine

## 2023-03-27 ENCOUNTER — Ambulatory Visit
Admission: RE | Admit: 2023-03-27 | Discharge: 2023-03-27 | Disposition: A | Discharge status: Home / Self Care | Payer: Medicaid Other | Source: Ambulatory Visit | Attending: Pulmonary Disease | Admitting: Pulmonary Disease

## 2023-03-27 VITALS — BP 118/72 | HR 99 | Temp 97.3°F | Ht 60.98 in

## 2023-03-27 DIAGNOSIS — Z8616 Personal history of COVID-19: Secondary | ICD-10-CM | POA: Diagnosis not present

## 2023-03-27 DIAGNOSIS — J449 Chronic obstructive pulmonary disease, unspecified: Secondary | ICD-10-CM

## 2023-03-27 DIAGNOSIS — Z930 Tracheostomy status: Secondary | ICD-10-CM

## 2023-03-27 DIAGNOSIS — J9610 Chronic respiratory failure, unspecified whether with hypoxia or hypercapnia: Secondary | ICD-10-CM

## 2023-03-27 DIAGNOSIS — R0602 Shortness of breath: Secondary | ICD-10-CM | POA: Diagnosis present

## 2023-03-27 DIAGNOSIS — Z9911 Dependence on respirator [ventilator] status: Secondary | ICD-10-CM

## 2023-03-27 MED ORDER — ALBUTEROL SULFATE (2.5 MG/3ML) 0.083% IN NEBU: 2.5000 mg | INHALATION_SOLUTION | Freq: Four times a day (QID) | RESPIRATORY_TRACT | 12 refills | Status: AC | PRN

## 2023-03-27 NOTE — Telephone Encounter (Signed)
Pt's caregiver/daughter is asking for possible cxr feels like pt/mom might have pneumonia

## 2023-03-27 NOTE — Patient Instructions (Signed)
VISIT SUMMARY:  During today's visit, we discussed your recent hospitalization due to COVID-19 and pneumonia, as well as your ongoing management of chronic respiratory failure. We reviewed your current symptoms, including episodes of severe shortness of breath and weakness, and adjusted your treatment plan to better support your recovery.  YOUR PLAN:  -POST-COVID-19 PNEUMONIA: This refers to lung infection and inflammation caused by the COVID-19 virus. Your recent chest x-ray shows significant improvement, but you may still feel weak as you recover. Continue using your home ventilator and try to stay active as much as you can to help with your recovery.  -CHRONIC HYPERCARBIC AND HYPOXIC RESPIRATORY FAILURE: This is a long-term condition where your lungs cannot get enough oxygen into your blood or remove enough carbon dioxide. You will continue using Pulmicort twice daily and Duoneb every 4-6 hours as needed. We are adding Albuterol nebulizer treatments to use between Duoneb doses if you need extra help with breathing. Keep taking Mucinex daily and drink plenty of fluids to help with thick secretions.  -GENERAL HEALTH MAINTENANCE: We recommend you get a flu shot at your upcoming primary care appointment to help protect you from the flu this season.  INSTRUCTIONS:  Please continue with your current ventilator support and medication regimen. Use the Albuterol nebulizer between Duoneb doses if needed. Stay active as tolerated to aid in your recovery. Ensure you stay hydrated and continue taking Mucinex daily. Remember to get your flu shot at your next primary care visit.  Will schedule you to see Dr. Belia Heman or one of our nurse practitioners in 2-3 months time.

## 2023-03-27 NOTE — Progress Notes (Signed)
@Patient  ID: Michelle [name], female    DOB: 1960-11-22, 63 y.o.   MRN: [id number]  Synopsis 62 year old female, former smoker.  Last medical history significant for severe COPD with chronic tracheostomy, chronic respiratory failure, asthma, atypical pneumonia/Pseudomonas respiratory infection, history of pneumothorax, tobacco abuse, cocaine abuse, CHF, coronary artery disease, hepatitis C, diabetes mellitus type 2.  Hospital admissions for COPD exacerbation and multi focal pneumonia. Recurrent bouts of pneumonia. Recent admission to [facility] between 18 February 2019 24 through 22 February 2023 with COVID-19 and bacterial pneumonia.  Pleated recent prednisone taper and antibiotics.  She has very severe COPD and chronic hypoxic hypercarbic respiratory failure status post chronic tracheostomy and is on vent.  Chief Complaint  Patient presents with   Follow-up    ED for Covid/Pneumonia. DOE. Little wheezing. Cough with white/clear/green sputum.    HPI: Discussed the use of AI scribe software for clinical note transcription with the patient, who gave verbal consent to proceed.  History of Present Illness   The patient, with a history of chronic hypercarbic and hypoxic respiratory failure on chronic ventilator support, was recently hospitalized due to COVID-19 and pneumonia. Post-hospitalization, the patient has been experiencing persistent weakness, likely a residual effect of the COVID-19 infection. The patient's caregiver reports that the patient has been managing well with the ventilator at home. The patient's current medication regimen includes Pulmicort twice daily and Duoneb every six hours, or every four hours during episodes of severe shortness of breath. The patient was also on a tapering course of prednisone, which ended the day before the consultation.  The patient's caregiver reports episodes of severe shortness of breath, during which the patient becomes very hot and has experienced a  significant drop in oxygen saturation levels. During these episodes, the patient feels as though she is not getting enough air, despite the ventilator not indicating any issues. The patient also appears to produce thick secretions during these episodes. The patient has been using Mucinex daily for this issue.     She follows with ENT for trach changes.  Daughter helps her at home.  She is mainly in the wheelchair.  Has a nurse daily for 8 hours.  That helps with her bathing and care.  She is not on any tube feeds any longer.  Palliative care follows at home.    TEST/EVENTS :  home vent settings  Vent Mode: PRVC FiO2 (%): 40 % S RR: 16 S VT: 450 mL PEEP: 5 cm H20   Reviewed patient's ventilator monitor, volume returned and mechanics look excellent.  Patient very comfortable on ventilator.  Allergies  Allergen Reactions   Amoxicillin     Patient has tolerated cephalosporins in the past   Ativan [Lorazepam]     Makes anxiety worse    Current Meds  Medication Sig   albuterol (PROVENTIL) (2.5 MG/3ML) 0.083% nebulizer solution Take 3 mLs (2.5 mg total) by nebulization every 6 (six) hours as needed for wheezing or shortness of breath.   albuterol (VENTOLIN HFA) 108 (90 Base) MCG/ACT inhaler Inhale 2 puffs into the lungs every 6 (six) hours as needed for wheezing or shortness of breath.   amLODipine (NORVASC) 5 MG tablet Take 5 mg by mouth daily.   aspirin 81 MG chewable tablet Chew 81 mg by mouth daily.    azelastine (ASTELIN) 0.1 % nasal spray SMARTSIG:1-2 Spray(s) Both Nares Every 12 Hours PRN   benzonatate (TESSALON PERLES) 100 MG capsule Take 1-2 capsules (100-200 mg total) by mouth 3 (three)  times daily as needed for cough.   Blood Glucose Monitoring Suppl ([employer] BLOOD GLUCOSE METER) w/Device KIT Use as directed to monitor blood glucose up to qid   cetirizine (ZYRTEC) 10 MG tablet Take 10 mg by mouth daily.   clonazePAM (KLONOPIN) 0.5 MG tablet Take 0.5 mg by mouth in the morning, at  noon, and at bedtime.   cloNIDine (CATAPRES - DOSED IN MG/24 HR) 0.1 mg/24hr patch Place 0.1 mg onto the skin once a week.   D3-50 1.25 MG (50000 UT) capsule Take 50,000 Units by mouth once a week.   fluticasone (FLONASE) 50 MCG/ACT nasal spray Place 1 spray into both nostrils daily.    guaiFENesin (MUCINEX) 600 MG 12 hr tablet Take 600 mg by mouth 2 (two) times daily.   hydrOXYzine (VISTARIL) 25 MG capsule Take 25 mg by mouth every 6 (six) hours as needed.   ipratropium-albuterol (DUONEB) 0.5-2.5 (3) MG/3ML SOLN Take 3 mLs by nebulization every 6 (six) hours. (Patient taking differently: Take 3 mLs by nebulization every 6 (six) hours as needed (for shortness of breath).)   LANTUS SOLOSTAR 100 UNIT/ML Solostar Pen Inject 10 Units into the skin at bedtime.   mirtazapine (REMERON) 30 MG tablet Take 30 mg by mouth at bedtime.   mupirocin ointment (BACTROBAN) 2 % Apply 1 Application topically daily. To inside nose and to affected area of axilla   naloxone (NARCAN) nasal spray 4 mg/0.1 mL Place 1 spray into the nose once.   NOVOLOG FLEXPEN 100 UNIT/ML FlexPen Inject into the skin 3 (three) times daily with meals. Sliding scale mealtime insulin.   nystatin ointment (MYCOSTATIN) Apply 1 Application topically 3 (three) times daily.   oxyCODONE-acetaminophen (PERCOCET/ROXICET) 5-325 MG tablet Take 1 tablet by mouth 3 (three) times daily as needed for pain.   PARoxetine (PAXIL) 40 MG tablet Take 40 mg by mouth every morning. Give 30 mg x 1 week (1.5 tab of 20 mg) then start 40 mg daily. D/c 20 mg pills at that time.   polyethylene glycol powder (GLYCOLAX/MIRALAX) 17 GM/SCOOP powder Take by mouth.   predniSONE (DELTASONE) 10 MG tablet Take 10 mg by mouth as needed.   PULMICORT 0.5 MG/2ML nebulizer solution SMARTSIG:1 Via Nebulizer Twice Daily   terbinafine (LAMISIL) 1 % cream Apply 1 application topically 2 (two) times daily. To affected skin, for 1 week after resolution of rash. # 30 gm, 1 refill.    TRULICITY 4.5 MG/0.5ML SOPN Inject 4.5 mg into the skin once a week.   Vitamin D, Ergocalciferol, (DRISDOL) 1.25 MG (50000 UNIT) CAPS capsule Take 50,000 Units by mouth 2 (two) times a week.   Immunization History  Administered Date(s) Administered   Influenza,inj,Quad PF,6+ Mos 03/15/2017, 06/23/2020   Influenza-Unspecified 05/04/2016, 03/06/2019   PFIZER(Purple Top)SARS-COV-2 Vaccination 02/17/2020, 03/08/2020   Pneumococcal Conjugate-13 12/22/2017   Tdap 07/18/2016   Past Medical History:  Diagnosis Date   Allergy    Anxiety    Asthma    CHF (congestive heart failure) (HCC)    Cocaine abuse (HCC)    COPD (chronic obstructive pulmonary disease) (HCC)    Coronary artery disease    Emphysema/COPD (HCC)    Hepatitis C    High cholesterol    Hypertension    Pneumothorax    Tobacco abuse    Tobacco History: Social History   Tobacco Use  Smoking Status Former   Current packs/day: 0.00   Average packs/day: 0.1 packs/day for 41.0 years (4.1 ttl pk-yrs)   Types: Cigarettes  Start date: 06/09/1976   Quit date: 06/09/2017   Years since quitting: 5.8  Smokeless Tobacco Never   ROS: A 10 point review of systems was performed and it is as noted above otherwise negative.   Physical Examination:   General Appearance: No distress  EYES PERRLA, EOM intact.   NECK Supple, No JVD, tracheostomy in place Pulmonary: normal breath sounds, no wheezing.  Synchronous with the ventilator CardiovascularNormal S1,S2.  No m/r/g.   Abdomen: Benign, Soft, non-tender. Neurology UE/LE 5/5 strength, no focal deficits Ext pulses intact, cap refill intact  Chest x-ray performed today, independently reviewed,some minimal streaky opacities bibasilarly likely representing atelectasis:     Assessment & Plan:   62 year old obese African-American female with severe end-stage COPD and end-stage chronic hypoxic and hypercapnic respiratory failure with severe debilitating respiratory disease status post  tracheostomy chronic vent dependent with a previous history of recurrent bouts of pneumonia and aspiration pneumonia      ICD-10-CM   1. Stage 4 very severe COPD by GOLD classification (HCC)  J44.9     2. Chronic respiratory failure requiring continuous mechanical ventilation through tracheostomy (HCC)  J96.10    Z93.0    Z99.11     3. SOB (shortness of breath)  R06.02 DG Chest 2 View    4. History of COVID-19  Z86.16      Orders Placed This Encounter  Procedures   DG Chest 2 View    Standing Status:   Future    Number of Occurrences:   1    Standing Expiration Date:   03/26/2024    Order Specific Question:   Reason for Exam (SYMPTOM  OR DIAGNOSIS REQUIRED)    Answer:   SOB    Order Specific Question:   Preferred imaging location?    Answer:   [facility]   Meds ordered this encounter  Medications   albuterol (PROVENTIL) (2.5 MG/3ML) 0.083% nebulizer solution    Sig: Take 3 mLs (2.5 mg total) by nebulization every 6 (six) hours as needed for wheezing or shortness of breath.    Dispense:  75 mL    Refill:  12   Assessment and Plan    Post-COVID-19 Pneumonia Recent hospitalization for COVID-19 with pneumonia. Current chest x-ray shows significant improvement with minor residual changes in the left lung, likely healing from COVID-19. Patient is on home ventilator support and reports feeling weak, which is common post-COVID-19. -Continue current ventilator support. -Encourage patient to stay active as tolerated to aid recovery.  Chronic Hypercarbic and Hypoxic Respiratory Failure Patient is on chronic ventilator support. Currently taking Pulmicort twice daily and Duoneb every 4-6 hours as needed for shortness of breath. Recently completed a course of Prednisone. -Continue Pulmicort twice daily and Duoneb every 4-6 hours as needed. -Add Albuterol nebulizer for use in between Duoneb doses if needed for additional respiratory support. -Continue daily Mucinex and ensure  adequate hydration.  General Health Maintenance -Recommend receiving flu shot at upcoming primary care appointment.      Follow-up in 2 to 3 months with Dr. Belia Heman or nurse practitioner.  Total visit time 45 minutes.  Gailen Shelter, MD Advanced Bronchoscopy PCCM [facility]    *This note was dictated using voice recognition software/Dragon.  Despite best efforts to proofread, errors can occur which can change the meaning. Any transcriptional errors that result from this process are unintentional and may not be fully corrected at the time of dictation.

## 2023-03-27 NOTE — Telephone Encounter (Signed)
Patient will be seen today by Dr. Jayme Cloud. Patient is aware.  Nothing further needed.

## 2023-04-18 ENCOUNTER — Emergency Department: Payer: Medicaid Other

## 2023-04-18 ENCOUNTER — Inpatient Hospital Stay
Admission: EM | Admit: 2023-04-18 | Discharge: 2023-04-24 | DRG: 870 | Disposition: A | Discharge status: Home Health | Payer: Medicaid Other | Attending: Internal Medicine | Admitting: Internal Medicine

## 2023-04-18 ENCOUNTER — Other Ambulatory Visit: Payer: Self-pay

## 2023-04-18 DIAGNOSIS — F32A Depression, unspecified: Secondary | ICD-10-CM | POA: Diagnosis present

## 2023-04-18 DIAGNOSIS — J440 Chronic obstructive pulmonary disease with acute lower respiratory infection: Secondary | ICD-10-CM | POA: Diagnosis present

## 2023-04-18 DIAGNOSIS — Z87891 Personal history of nicotine dependence: Secondary | ICD-10-CM

## 2023-04-18 DIAGNOSIS — R0602 Shortness of breath: Secondary | ICD-10-CM

## 2023-04-18 DIAGNOSIS — J189 Pneumonia, unspecified organism: Secondary | ICD-10-CM

## 2023-04-18 DIAGNOSIS — E7800 Pure hypercholesterolemia, unspecified: Secondary | ICD-10-CM | POA: Diagnosis present

## 2023-04-18 DIAGNOSIS — Z794 Long term (current) use of insulin: Secondary | ICD-10-CM

## 2023-04-18 DIAGNOSIS — E876 Hypokalemia: Secondary | ICD-10-CM | POA: Diagnosis present

## 2023-04-18 DIAGNOSIS — J9611 Chronic respiratory failure with hypoxia: Secondary | ICD-10-CM | POA: Diagnosis present

## 2023-04-18 DIAGNOSIS — E119 Type 2 diabetes mellitus without complications: Secondary | ICD-10-CM

## 2023-04-18 DIAGNOSIS — K5900 Constipation, unspecified: Secondary | ICD-10-CM | POA: Diagnosis present

## 2023-04-18 DIAGNOSIS — J441 Chronic obstructive pulmonary disease with (acute) exacerbation: Secondary | ICD-10-CM | POA: Insufficient documentation

## 2023-04-18 DIAGNOSIS — I2510 Atherosclerotic heart disease of native coronary artery without angina pectoris: Secondary | ICD-10-CM | POA: Diagnosis present

## 2023-04-18 DIAGNOSIS — Z930 Tracheostomy status: Secondary | ICD-10-CM

## 2023-04-18 DIAGNOSIS — I5042 Chronic combined systolic (congestive) and diastolic (congestive) heart failure: Secondary | ICD-10-CM | POA: Diagnosis present

## 2023-04-18 DIAGNOSIS — I252 Old myocardial infarction: Secondary | ICD-10-CM

## 2023-04-18 DIAGNOSIS — A419 Sepsis, unspecified organism: Principal | ICD-10-CM | POA: Diagnosis present

## 2023-04-18 DIAGNOSIS — Z7982 Long term (current) use of aspirin: Secondary | ICD-10-CM

## 2023-04-18 DIAGNOSIS — Z1152 Encounter for screening for COVID-19: Secondary | ICD-10-CM

## 2023-04-18 DIAGNOSIS — Z880 Allergy status to penicillin: Secondary | ICD-10-CM

## 2023-04-18 DIAGNOSIS — Z825 Family history of asthma and other chronic lower respiratory diseases: Secondary | ICD-10-CM

## 2023-04-18 DIAGNOSIS — Z7951 Long term (current) use of inhaled steroids: Secondary | ICD-10-CM

## 2023-04-18 DIAGNOSIS — R1031 Right lower quadrant pain: Principal | ICD-10-CM

## 2023-04-18 DIAGNOSIS — Z888 Allergy status to other drugs, medicaments and biological substances status: Secondary | ICD-10-CM

## 2023-04-18 DIAGNOSIS — I110 Hypertensive heart disease with heart failure: Secondary | ICD-10-CM | POA: Diagnosis present

## 2023-04-18 DIAGNOSIS — Z7985 Long-term (current) use of injectable non-insulin antidiabetic drugs: Secondary | ICD-10-CM

## 2023-04-18 DIAGNOSIS — Z66 Do not resuscitate: Secondary | ICD-10-CM | POA: Diagnosis present

## 2023-04-18 DIAGNOSIS — I10 Essential (primary) hypertension: Secondary | ICD-10-CM | POA: Insufficient documentation

## 2023-04-18 DIAGNOSIS — J449 Chronic obstructive pulmonary disease, unspecified: Secondary | ICD-10-CM | POA: Insufficient documentation

## 2023-04-18 DIAGNOSIS — F419 Anxiety disorder, unspecified: Secondary | ICD-10-CM | POA: Diagnosis present

## 2023-04-18 DIAGNOSIS — Z8249 Family history of ischemic heart disease and other diseases of the circulatory system: Secondary | ICD-10-CM

## 2023-04-18 DIAGNOSIS — E1165 Type 2 diabetes mellitus with hyperglycemia: Secondary | ICD-10-CM | POA: Diagnosis present

## 2023-04-18 DIAGNOSIS — N390 Urinary tract infection, site not specified: Secondary | ICD-10-CM | POA: Diagnosis present

## 2023-04-18 DIAGNOSIS — J439 Emphysema, unspecified: Secondary | ICD-10-CM | POA: Diagnosis present

## 2023-04-18 DIAGNOSIS — K8689 Other specified diseases of pancreas: Secondary | ICD-10-CM | POA: Diagnosis present

## 2023-04-18 DIAGNOSIS — Z79899 Other long term (current) drug therapy: Secondary | ICD-10-CM

## 2023-04-18 DIAGNOSIS — R9389 Abnormal findings on diagnostic imaging of other specified body structures: Secondary | ICD-10-CM | POA: Insufficient documentation

## 2023-04-18 LAB — TROPONIN I (HIGH SENSITIVITY): Troponin I (High Sensitivity): 14 ng/L (ref <18)

## 2023-04-18 LAB — CBC WITH DIFFERENTIAL/PLATELET
Abs Immature Granulocytes: 0.04 10*3/uL (ref 0.00–0.07)
Basophils Absolute: 0 10*3/uL (ref 0.0–0.1)
Basophils Relative: 0 %
Eosinophils Absolute: 0.3 10*3/uL (ref 0.0–0.5)
Eosinophils Relative: 3 %
HCT: 37.1 % (ref 36.0–46.0)
Hemoglobin: 11.8 g/dL — ABNORMAL LOW (ref 12.0–15.0)
Immature Granulocytes: 0 %
Lymphocytes Relative: 23 %
Lymphs Abs: 2.2 10*3/uL (ref 0.7–4.0)
MCH: 26.4 pg (ref 26.0–34.0)
MCHC: 31.8 g/dL (ref 30.0–36.0)
MCV: 83 fL (ref 80.0–100.0)
Monocytes Absolute: 0.8 10*3/uL (ref 0.1–1.0)
Monocytes Relative: 9 %
Neutro Abs: 6.1 10*3/uL (ref 1.7–7.7)
Neutrophils Relative %: 65 %
Platelets: 232 10*3/uL (ref 150–400)
RBC: 4.47 MIL/uL (ref 3.87–5.11)
RDW: 14.1 % (ref 11.5–15.5)
WBC: 9.4 10*3/uL (ref 4.0–10.5)
nRBC: 0 % (ref 0.0–0.2)

## 2023-04-18 LAB — URINALYSIS, W/ REFLEX TO CULTURE (INFECTION SUSPECTED)
Bacteria, UA: NONE SEEN
Bilirubin Urine: NEGATIVE
Glucose, UA: >=500 mg/dL — AB
Ketones, ur: 5 mg/dL — AB
Nitrite: NEGATIVE
Protein, ur: 100 mg/dL — AB
Specific Gravity, Urine: 1.023 (ref 1.005–1.030)
WBC, UA: >50 WBC/hpf (ref 0–5)
pH: 5 (ref 5.0–8.0)

## 2023-04-18 LAB — COMPREHENSIVE METABOLIC PANEL
ALT: 20 U/L (ref 0–44)
AST: 16 U/L (ref 15–41)
Albumin: 3.6 g/dL (ref 3.5–5.0)
Alkaline Phosphatase: 80 U/L (ref 38–126)
Anion gap: 11 (ref 5–15)
BUN: 8 mg/dL (ref 8–23)
CO2: 26 mmol/L (ref 22–32)
Calcium: 9 mg/dL (ref 8.9–10.3)
Chloride: 100 mmol/L (ref 98–111)
Creatinine, Ser: 0.61 mg/dL (ref 0.44–1.00)
GFR, Estimated: >60 mL/min (ref >60)
Glucose, Bld: 221 mg/dL — ABNORMAL HIGH (ref 70–99)
Potassium: 3.4 mmol/L — ABNORMAL LOW (ref 3.5–5.1)
Sodium: 137 mmol/L (ref 135–145)
Total Bilirubin: 0.8 mg/dL (ref <1.2)
Total Protein: 7.7 g/dL (ref 6.5–8.1)

## 2023-04-18 LAB — RESP PANEL BY RT-PCR (RSV, FLU A&B, COVID)  RVPGX2
Influenza A by PCR: NEGATIVE
Influenza B by PCR: NEGATIVE
Resp Syncytial Virus by PCR: NEGATIVE
SARS Coronavirus 2 by RT PCR: NEGATIVE

## 2023-04-18 LAB — LACTIC ACID, PLASMA: Lactic Acid, Venous: 1 mmol/L (ref 0.5–1.9)

## 2023-04-18 LAB — BRAIN NATRIURETIC PEPTIDE: B Natriuretic Peptide: 40.3 pg/mL (ref 0.0–100.0)

## 2023-04-18 LAB — LIPASE, BLOOD: Lipase: 35 U/L (ref 11–51)

## 2023-04-18 MED ORDER — HYDROMORPHONE HCL 1 MG/ML IJ SOLN
0.5000 mg | Freq: Once | INTRAMUSCULAR | Status: AC
Start: 2023-04-18 — End: 2023-04-18
  Administered 2023-04-18: 0.5 mg via INTRAVENOUS
  Filled 2023-04-18: qty 0.5

## 2023-04-18 MED ORDER — IOHEXOL 300 MG/ML  SOLN
100.0000 mL | Freq: Once | INTRAMUSCULAR | Status: AC | PRN
  Administered 2023-04-19: 100 mL via INTRAVENOUS

## 2023-04-18 NOTE — ED Provider Notes (Signed)
[facility] Provider Note    None    (approximate)   History   No chief complaint on file.   HPI  Michelle [name] is a 62 y.o. female with a history of chronic respiratory failure on mechanical ventilation, COPD, type 2 diabetes, hyperlipidemia, CHF, CAD, hypertension who presents with right lower abdominal pain rating down to the right lower part of her back over the last 2 days, persistent course, and associated with orange-colored urine.  The patient denies any dysuria or frequency but states that she has to strain to urinate.  She has no nausea or vomiting, and denies diarrhea.  The patient does report increased shortness of breath and cough over the last several days as well.  She dates she had some chest pain earlier and felt she was having a heart attack.  I reviewed the past medical records.  The patient was most recently mated to the hospital service in September with fever and shortness of breath.  She was treated for acute on chronic respiratory failure due to COVID and COPD.   Physical Exam   Triage Vital Signs: ED Triage Vitals  Encounter Vitals Group     BP 04/18/23 2201 125/75     Systolic BP Percentile --      Diastolic BP Percentile --      Pulse Rate 04/18/23 2159 (!) 104     Resp 04/18/23 2159 (!) 21     Temp 04/18/23 2201 99.9 F (37.7 C)     Temp Source 04/18/23 2201 Oral     SpO2 04/18/23 2159 100 %     Weight 04/18/23 2201 172 lb 8 oz (78.2 kg)     Height 04/18/23 2201 5' (1.524 m)     Head Circumference --      Peak Flow --      Pain Score 04/18/23 2200 8     Pain Loc --      Pain Education --      Exclude from Growth Chart --     Most recent vital signs: Vitals:   04/18/23 2159 04/18/23 2201  BP:  125/75  Pulse: (!) 104 (!) 102  Resp: (!) 21 (!) 22  Temp:  99.9 F (37.7 C)  SpO2: 100% 100%     General: Awake, no distress.  CV:  Good peripheral perfusion. Resp:  Normal effort.  Slightly coarse breath sounds but  no significant wheezing or rales. Abd:  Right lower quadrant/right flank tenderness.  No distention.  Other:  No jaundice or scleral icterus.   ED Results / Procedures / Treatments   Labs (all labs ordered are listed, but only abnormal results are displayed) Labs Reviewed  RESP PANEL BY RT-PCR (RSV, FLU A&B, COVID)  [id number]  LACTIC ACID, PLASMA  LACTIC ACID, PLASMA  COMPREHENSIVE METABOLIC PANEL  CBC WITH DIFFERENTIAL/PLATELET  LIPASE, BLOOD  BRAIN NATRIURETIC PEPTIDE  URINALYSIS, W/ REFLEX TO CULTURE (INFECTION SUSPECTED)  TROPONIN I (HIGH SENSITIVITY)     EKG  ED ECG REPORT I, Dionne Bucy, the attending physician, personally viewed and interpreted this ECG.  Date: 04/18/2023 EKG Time: 2159 Rate: 103 Rhythm: normal sinus rhythm QRS Axis: normal Intervals: RBBB, LAFB ST/T Wave abnormalities: normal Narrative Interpretation: no evidence of acute ischemia    RADIOLOGY  Chest x-ray: Pending  CT abdomen/pelvis: Pending   PROCEDURES:  Critical Care performed: No  Procedures   MEDICATIONS ORDERED IN ED: Medications  HYDROmorphone (DILAUDID) injection 0.5 mg (has no administration in  time range)     IMPRESSION / MDM / ASSESSMENT AND PLAN / ED COURSE  I reviewed the triage vital signs and the nursing notes.  62 year old female with PMH as noted above including chronic respiratory failure, trach and vent dependent, who presents with right lower quadrant/right flank pain for the last few days along with increased shortness of breath and cough.  On exam the patient does have some tenderness in the right lower quadrant.  She does not demonstrate any acute respiratory distress.  Vital signs are normal except for borderline tachycardia and borderline elevated temperature.  Differential diagnosis includes, but is not limited to, UTI/pyelonephritis, ureteral stone, colitis, diverticulitis, volvulus, musculoskeletal pain.  Differential also includes pneumonia or  other acute respiratory infection.  We will obtain chest x-ray, CT abdomen/pelvis, lab workup, and reassess.  Patient's presentation is most consistent with acute presentation with potential threat to life or bodily function.  The patient is on the cardiac monitor to evaluate for evidence of arrhythmia and/or significant heart rate changes.  ----------------------------------------- 11:10 PM on 04/18/2023 -----------------------------------------  Lab workup and imaging is pending.  I have signed the patient out to the oncoming ED provider.   FINAL CLINICAL IMPRESSION(S) / ED DIAGNOSES   Final diagnoses:  Right lower quadrant pain  Shortness of breath     Rx / DC Orders   ED Discharge Orders     None        Note:  This document was prepared using Dragon voice recognition software and may include unintentional dictation errors.    Dionne Bucy, MD 04/18/23 2311

## 2023-04-18 NOTE — ED Triage Notes (Signed)
Pt to ED via [facility] from home with complaints of RLQ pain that worsens on palpation. Per EMS pt has had decreased urine output and orange colored urine x3 days. Pt also complains of intermittent SOB. Pt arrives on home ventilator on 8L O2. Per EMS pt receives nebulizer treatments Q6h and had one administered right before EMS left the scene.    EMS vitals  BP 168/94 106 HR SPO2 99% on 8L Temp 100.58F CBG 226

## 2023-04-18 NOTE — Progress Notes (Signed)
Called by Rn to assess home vent pt. Familiar with pt and pt states trach and vent is of no concern at present moment. Vent (home trilogy) is plugged in red outlet, O2 is connected. Pt is in no distress. RR 18, SPO2 100%. Available for any interventions.

## 2023-04-19 ENCOUNTER — Emergency Department: Payer: Medicaid Other

## 2023-04-19 ENCOUNTER — Ambulatory Visit: Payer: Medicaid Other | Admitting: Internal Medicine

## 2023-04-19 ENCOUNTER — Encounter: Payer: Self-pay | Admitting: Family Medicine

## 2023-04-19 DIAGNOSIS — A419 Sepsis, unspecified organism: Secondary | ICD-10-CM | POA: Diagnosis present

## 2023-04-19 DIAGNOSIS — F419 Anxiety disorder, unspecified: Secondary | ICD-10-CM | POA: Diagnosis present

## 2023-04-19 DIAGNOSIS — I5042 Chronic combined systolic (congestive) and diastolic (congestive) heart failure: Secondary | ICD-10-CM | POA: Diagnosis present

## 2023-04-19 DIAGNOSIS — J439 Emphysema, unspecified: Secondary | ICD-10-CM | POA: Diagnosis present

## 2023-04-19 DIAGNOSIS — J9611 Chronic respiratory failure with hypoxia: Secondary | ICD-10-CM | POA: Diagnosis present

## 2023-04-19 DIAGNOSIS — R0602 Shortness of breath: Secondary | ICD-10-CM | POA: Diagnosis present

## 2023-04-19 DIAGNOSIS — R9389 Abnormal findings on diagnostic imaging of other specified body structures: Secondary | ICD-10-CM

## 2023-04-19 DIAGNOSIS — E7800 Pure hypercholesterolemia, unspecified: Secondary | ICD-10-CM | POA: Diagnosis present

## 2023-04-19 DIAGNOSIS — N390 Urinary tract infection, site not specified: Secondary | ICD-10-CM | POA: Diagnosis present

## 2023-04-19 DIAGNOSIS — Z66 Do not resuscitate: Secondary | ICD-10-CM | POA: Diagnosis present

## 2023-04-19 DIAGNOSIS — F32A Depression, unspecified: Secondary | ICD-10-CM | POA: Diagnosis present

## 2023-04-19 DIAGNOSIS — J440 Chronic obstructive pulmonary disease with acute lower respiratory infection: Secondary | ICD-10-CM | POA: Diagnosis present

## 2023-04-19 DIAGNOSIS — Z1152 Encounter for screening for COVID-19: Secondary | ICD-10-CM | POA: Diagnosis not present

## 2023-04-19 DIAGNOSIS — K5900 Constipation, unspecified: Secondary | ICD-10-CM | POA: Diagnosis present

## 2023-04-19 DIAGNOSIS — J449 Chronic obstructive pulmonary disease, unspecified: Secondary | ICD-10-CM

## 2023-04-19 DIAGNOSIS — E1165 Type 2 diabetes mellitus with hyperglycemia: Secondary | ICD-10-CM | POA: Diagnosis present

## 2023-04-19 DIAGNOSIS — I2510 Atherosclerotic heart disease of native coronary artery without angina pectoris: Secondary | ICD-10-CM | POA: Diagnosis present

## 2023-04-19 DIAGNOSIS — J189 Pneumonia, unspecified organism: Secondary | ICD-10-CM | POA: Diagnosis present

## 2023-04-19 DIAGNOSIS — Z8249 Family history of ischemic heart disease and other diseases of the circulatory system: Secondary | ICD-10-CM | POA: Diagnosis not present

## 2023-04-19 DIAGNOSIS — J441 Chronic obstructive pulmonary disease with (acute) exacerbation: Secondary | ICD-10-CM | POA: Insufficient documentation

## 2023-04-19 DIAGNOSIS — Z930 Tracheostomy status: Secondary | ICD-10-CM | POA: Diagnosis not present

## 2023-04-19 DIAGNOSIS — Z794 Long term (current) use of insulin: Secondary | ICD-10-CM | POA: Diagnosis not present

## 2023-04-19 DIAGNOSIS — I10 Essential (primary) hypertension: Secondary | ICD-10-CM

## 2023-04-19 DIAGNOSIS — K8689 Other specified diseases of pancreas: Secondary | ICD-10-CM | POA: Diagnosis present

## 2023-04-19 DIAGNOSIS — I110 Hypertensive heart disease with heart failure: Secondary | ICD-10-CM | POA: Diagnosis present

## 2023-04-19 DIAGNOSIS — R1031 Right lower quadrant pain: Secondary | ICD-10-CM | POA: Diagnosis present

## 2023-04-19 DIAGNOSIS — Z87891 Personal history of nicotine dependence: Secondary | ICD-10-CM | POA: Diagnosis not present

## 2023-04-19 DIAGNOSIS — E876 Hypokalemia: Secondary | ICD-10-CM | POA: Diagnosis present

## 2023-04-19 DIAGNOSIS — E119 Type 2 diabetes mellitus without complications: Secondary | ICD-10-CM

## 2023-04-19 LAB — RESPIRATORY PANEL BY PCR

## 2023-04-19 LAB — HIV ANTIBODY (ROUTINE TESTING W REFLEX): HIV Screen 4th Generation wRfx: NONREACTIVE

## 2023-04-19 LAB — PROCALCITONIN: Procalcitonin: <0.1 ng/mL

## 2023-04-19 LAB — BASIC METABOLIC PANEL
Anion gap: 7 (ref 5–15)
BUN: 7 mg/dL — ABNORMAL LOW (ref 8–23)
CO2: 24 mmol/L (ref 22–32)
Calcium: 7.6 mg/dL — ABNORMAL LOW (ref 8.9–10.3)
Chloride: 105 mmol/L (ref 98–111)
Creatinine, Ser: 0.68 mg/dL (ref 0.44–1.00)
GFR, Estimated: >60 mL/min (ref >60)
Glucose, Bld: 286 mg/dL — ABNORMAL HIGH (ref 70–99)
Potassium: 3.3 mmol/L — ABNORMAL LOW (ref 3.5–5.1)
Sodium: 136 mmol/L (ref 135–145)

## 2023-04-19 LAB — CBC
HCT: 30.3 % — ABNORMAL LOW (ref 36.0–46.0)
Hemoglobin: 9.4 g/dL — ABNORMAL LOW (ref 12.0–15.0)
MCH: 26.9 pg (ref 26.0–34.0)
MCHC: 31 g/dL (ref 30.0–36.0)
MCV: 86.6 fL (ref 80.0–100.0)
Platelets: 166 10*3/uL (ref 150–400)
RBC: 3.5 MIL/uL — ABNORMAL LOW (ref 3.87–5.11)
RDW: 14.3 % (ref 11.5–15.5)
WBC: 6.5 10*3/uL (ref 4.0–10.5)
nRBC: 0 % (ref 0.0–0.2)

## 2023-04-19 LAB — CORTISOL-AM, BLOOD: Cortisol - AM: 5 ug/dL — ABNORMAL LOW (ref 6.7–22.6)

## 2023-04-19 LAB — PROTIME-INR
INR: 1.1 (ref 0.8–1.2)
Prothrombin Time: 14.7 s (ref 11.4–15.2)

## 2023-04-19 LAB — MRSA NEXT GEN BY PCR, NASAL: MRSA by PCR Next Gen: NOT DETECTED

## 2023-04-19 LAB — CBG MONITORING, ED: Glucose-Capillary: 288 mg/dL — ABNORMAL HIGH (ref 70–99)

## 2023-04-19 LAB — TROPONIN I (HIGH SENSITIVITY): Troponin I (High Sensitivity): 16 ng/L (ref <18)

## 2023-04-19 MED ORDER — IPRATROPIUM-ALBUTEROL 0.5-2.5 (3) MG/3ML IN SOLN
3.0000 mL | Freq: Four times a day (QID) | RESPIRATORY_TRACT | Status: DC
  Administered 2023-04-19 – 2023-04-24 (×21): 3 mL via RESPIRATORY_TRACT
  Filled 2023-04-19 (×22): qty 3

## 2023-04-19 MED ORDER — HYDROXYZINE HCL 25 MG PO TABS
25.0000 mg | ORAL_TABLET | Freq: Four times a day (QID) | ORAL | Status: DC | PRN
  Administered 2023-04-19 – 2023-04-20 (×3): 25 mg via ORAL
  Filled 2023-04-19 (×3): qty 1

## 2023-04-19 MED ORDER — SODIUM CHLORIDE 0.9 % IV BOLUS
1500.0000 mL | Freq: Once | INTRAVENOUS | Status: AC
Start: 2023-04-19 — End: 2023-04-19
  Administered 2023-04-19: 1500 mL via INTRAVENOUS

## 2023-04-19 MED ORDER — PAROXETINE HCL 20 MG PO TABS
40.0000 mg | ORAL_TABLET | ORAL | Status: DC
  Administered 2023-04-19 – 2023-04-24 (×6): 40 mg via ORAL
  Filled 2023-04-19 (×6): qty 2

## 2023-04-19 MED ORDER — TRAZODONE HCL 50 MG PO TABS
25.0000 mg | ORAL_TABLET | Freq: Every evening | ORAL | Status: DC | PRN
  Administered 2023-04-20 – 2023-04-23 (×4): 25 mg via ORAL
  Filled 2023-04-19 (×4): qty 1

## 2023-04-19 MED ORDER — OXYCODONE-ACETAMINOPHEN 5-325 MG PO TABS
1.0000 | ORAL_TABLET | Freq: Three times a day (TID) | ORAL | Status: DC | PRN
  Administered 2023-04-19 – 2023-04-23 (×10): 1 via ORAL
  Filled 2023-04-19 (×11): qty 1

## 2023-04-19 MED ORDER — SODIUM CHLORIDE 0.9 % IV SOLN
1.0000 g | Freq: Once | INTRAVENOUS | Status: AC
  Administered 2023-04-19: 1 g via INTRAVENOUS
  Filled 2023-04-19: qty 10

## 2023-04-19 MED ORDER — MAGNESIUM HYDROXIDE 400 MG/5ML PO SUSP: 30.0000 mL | Freq: Every day | ORAL | Status: DC | PRN

## 2023-04-19 MED ORDER — FLUTICASONE PROPIONATE 50 MCG/ACT NA SUSP: 1.0000 | Freq: Every day | NASAL | Status: DC | PRN

## 2023-04-19 MED ORDER — AMLODIPINE BESYLATE 5 MG PO TABS
5.0000 mg | ORAL_TABLET | Freq: Every day | ORAL | Status: DC
  Administered 2023-04-19 – 2023-04-21 (×3): 5 mg via ORAL
  Filled 2023-04-19 (×4): qty 1

## 2023-04-19 MED ORDER — CHOLECALCIFEROL 1.25 MG (50000 UT) PO CAPS: 50000.0000 [IU] | ORAL_CAPSULE | ORAL | Status: DC

## 2023-04-19 MED ORDER — INSULIN GLARGINE-YFGN 100 UNIT/ML ~~LOC~~ SOLN
10.0000 [IU] | Freq: Every day | SUBCUTANEOUS | Status: DC
  Administered 2023-04-19 – 2023-04-23 (×5): 10 [IU] via SUBCUTANEOUS
  Filled 2023-04-19 (×6): qty 0.1

## 2023-04-19 MED ORDER — SENNOSIDES-DOCUSATE SODIUM 8.6-50 MG PO TABS: 1.0000 | ORAL_TABLET | Freq: Every evening | ORAL | Status: DC | PRN

## 2023-04-19 MED ORDER — ACETAMINOPHEN 325 MG PO TABS
650.0000 mg | ORAL_TABLET | Freq: Four times a day (QID) | ORAL | Status: DC | PRN
  Administered 2023-04-20: 650 mg via ORAL
  Filled 2023-04-19: qty 2

## 2023-04-19 MED ORDER — ONDANSETRON HCL 4 MG PO TABS: 4.0000 mg | ORAL_TABLET | Freq: Four times a day (QID) | ORAL | Status: DC | PRN

## 2023-04-19 MED ORDER — ACETAMINOPHEN 650 MG RE SUPP: 650.0000 mg | Freq: Four times a day (QID) | RECTAL | Status: DC | PRN

## 2023-04-19 MED ORDER — VITAMIN D 25 MCG (1000 UNIT) PO TABS
1000.0000 [IU] | ORAL_TABLET | Freq: Every day | ORAL | Status: DC
  Administered 2023-04-22 – 2023-04-24 (×3): 1000 [IU] via ORAL
  Filled 2023-04-19 (×3): qty 1

## 2023-04-19 MED ORDER — BENZONATATE 100 MG PO CAPS: 100.0000 mg | ORAL_CAPSULE | Freq: Three times a day (TID) | ORAL | Status: DC | PRN

## 2023-04-19 MED ORDER — HYDRALAZINE HCL 20 MG/ML IJ SOLN
10.0000 mg | INTRAMUSCULAR | Status: DC | PRN
Start: 2023-04-19 — End: 2023-04-24

## 2023-04-19 MED ORDER — DEXTROSE 5 % IV SOLN
500.0000 mg | Freq: Once | INTRAVENOUS | Status: AC
  Administered 2023-04-19: 500 mg via INTRAVENOUS
  Filled 2023-04-19: qty 5

## 2023-04-19 MED ORDER — IPRATROPIUM-ALBUTEROL 0.5-2.5 (3) MG/3ML IN SOLN: 3.0000 mL | RESPIRATORY_TRACT | Status: DC | PRN

## 2023-04-19 MED ORDER — DM-GUAIFENESIN ER 30-600 MG PO TB12
1.0000 | ORAL_TABLET | Freq: Two times a day (BID) | ORAL | Status: DC
  Administered 2023-04-19 – 2023-04-21 (×6): 1 via ORAL
  Filled 2023-04-19 (×6): qty 1

## 2023-04-19 MED ORDER — VITAMIN D (ERGOCALCIFEROL) 1.25 MG (50000 UNIT) PO CAPS: 50000.0000 [IU] | ORAL_CAPSULE | ORAL | Status: DC

## 2023-04-19 MED ORDER — DEXTROSE 5 % IV SOLN
500.0000 mg | INTRAVENOUS | Status: DC
  Administered 2023-04-19: 500 mg via INTRAVENOUS
  Filled 2023-04-19: qty 5

## 2023-04-19 MED ORDER — ONDANSETRON HCL 4 MG/2ML IJ SOLN: 4.0000 mg | Freq: Four times a day (QID) | INTRAMUSCULAR | Status: DC | PRN

## 2023-04-19 MED ORDER — LACTATED RINGERS IV SOLN
150.0000 mL/h | INTRAVENOUS | Status: DC
  Administered 2023-04-19: 150 mL/h via INTRAVENOUS

## 2023-04-19 MED ORDER — CLONIDINE HCL 0.1 MG/24HR TD PTWK
0.1000 mg | MEDICATED_PATCH | TRANSDERMAL | Status: DC
  Administered 2023-04-21: 0.1 mg via TRANSDERMAL
  Filled 2023-04-19: qty 1

## 2023-04-19 MED ORDER — MIRTAZAPINE 15 MG PO TABS
30.0000 mg | ORAL_TABLET | Freq: Every day | ORAL | Status: DC
  Administered 2023-04-19 – 2023-04-23 (×5): 30 mg via ORAL
  Filled 2023-04-19 (×5): qty 2

## 2023-04-19 MED ORDER — ENOXAPARIN SODIUM 40 MG/0.4ML IJ SOSY
40.0000 mg | PREFILLED_SYRINGE | INTRAMUSCULAR | Status: DC
Start: 2023-04-19 — End: 2023-04-24
  Administered 2023-04-19 – 2023-04-24 (×6): 40 mg via SUBCUTANEOUS
  Filled 2023-04-19 (×6): qty 0.4

## 2023-04-19 MED ORDER — SODIUM CHLORIDE 0.9 % IV SOLN
2.0000 g | INTRAVENOUS | Status: DC
  Administered 2023-04-19: 2 g via INTRAVENOUS
  Filled 2023-04-19: qty 20

## 2023-04-19 MED ORDER — POTASSIUM CHLORIDE 20 MEQ PO PACK
40.0000 meq | PACK | Freq: Once | ORAL | Status: AC
  Administered 2023-04-19: 40 meq via ORAL
  Filled 2023-04-19: qty 2

## 2023-04-19 MED ORDER — ASPIRIN 81 MG PO CHEW
81.0000 mg | CHEWABLE_TABLET | Freq: Every day | ORAL | Status: DC
  Administered 2023-04-19 – 2023-04-24 (×6): 81 mg via ORAL
  Filled 2023-04-19 (×6): qty 1

## 2023-04-19 MED ORDER — LORATADINE 10 MG PO TABS
10.0000 mg | ORAL_TABLET | Freq: Every day | ORAL | Status: DC
  Administered 2023-04-19 – 2023-04-24 (×6): 10 mg via ORAL
  Filled 2023-04-19 (×6): qty 1

## 2023-04-19 MED ORDER — FLUTICASONE PROPIONATE 50 MCG/ACT NA SUSP: 1.0000 | Freq: Every day | NASAL | Status: DC

## 2023-04-19 NOTE — Assessment & Plan Note (Addendum)
-   This involves the abdominal CT scan that revealed a 6 mm pancreatic ductal dilatation as well as lung nodule that will need a nonurgent follow-up is recommended.

## 2023-04-19 NOTE — Assessment & Plan Note (Signed)
-   The patient will be placed on supplemental coverage with NovoLog. - We will continue basal coverage. 

## 2023-04-19 NOTE — ED Notes (Signed)
Pt boosted and repositioned. Meal tray set up. No other needs expressed. Call light within reach, bed in lowest position.

## 2023-04-19 NOTE — ED Notes (Signed)
Pt was suctioned twice which produced white thick mucous. Pt tolerated it well.

## 2023-04-19 NOTE — Assessment & Plan Note (Signed)
-   We will place the patient on scheduled and as needed DuoNebs in addition to mucolytic therapy

## 2023-04-19 NOTE — H&P (Signed)
Michelle [name]   PATIENT NAME: Michelle [name]    MR#:  [id number]  DATE OF BIRTH:  [birth year]  DATE OF ADMISSION:  04/18/2023  PRIMARY CARE PHYSICIAN: Abram Sander, MD   Patient is coming from: Home  REQUESTING/REFERRING PHYSICIAN: Pilar Jarvis, MD  CHIEF COMPLAINT:   Chief Complaint  Patient presents with   Abdominal Pain    HISTORY OF PRESENT ILLNESS:  Michelle [name] is a 62 y.o. female with medical history significant for asthma, COPD, CAD, hypertension and dyslipidemia, who presented to the emergency room with acute onset of worsening dyspnea with associated congested cough with inability to expectorate over the last 2 to 3 days as well as wheezing.  The patient has been having chills with low-grade fever.  She denies any dysuria,or hematuria or flank pain.  No nausea or vomiting or abdominal pain.  No bleeding diathesis.  ED Course: When the patient came to the ER, temperature was 99.9 with heart rate of 104 and respiratory to 21 and later 23.  Labs revealed mild hypokalemia of 3.4 and hyperglycemia of 221 with otherwise unremarkable CMP.  CBC showed hemoglobin 11.8 and hematocrit 37.1.  Respiratory panel came back negative.  UA was positive for UTI.  Blood cultures were drawn. EKG as reviewed by me : EKG showed sinus tachycardia with rate 103 with right bundle branch block and left posterior fascicular block. Imaging: Abdominal and pelvic CT scan revealed no acute abdominal or pelvic abnormality and normal appendix.  It showed slightly increased prominence in the pancreatic duct measuring 6 mm, hepatic steatosis and aortic atherosclerosis as well as emphysema.  Portable chest x-ray showed hyperinflation with emphysema and increased right basilar opacity possibly atelectasis or pneumonia with possible right apical lung nodule with recommendation for further evaluation with chest CT.  The patient was given IV Rocephin and Zithromax as well as 0.5 mg of IV Dilaudid 1.5 L bolus of IV  normal saline.  She will be admitted to a progressive unit bed for further evaluation and management. PAST MEDICAL HISTORY:   Past Medical History:  Diagnosis Date   Allergy    Anxiety    Asthma    CHF (congestive heart failure) (HCC)    Cocaine abuse (HCC)    COPD (chronic obstructive pulmonary disease) (HCC)    Coronary artery disease    Emphysema/COPD (HCC)    Hepatitis C    High cholesterol    Hypertension    Pneumothorax    Tobacco abuse     PAST SURGICAL HISTORY:   Past Surgical History:  Procedure Laterality Date   CARDIAC CATHETERIZATION     CHEST TUBE INSERTION     IR GASTROSTOMY TUBE MOD SED  10/31/2017   TRACHEOSTOMY TUBE PLACEMENT N/A 10/17/2017   Procedure: TRACHEOSTOMY;  Surgeon: Bud Face, MD;  Location: [facility] ORS;  Service: ENT;  Laterality: N/A;    SOCIAL HISTORY:   Social History   Tobacco Use   Smoking status: Former    Current packs/day: 0.00    Average packs/day: 0.1 packs/day for 41.0 years (4.1 ttl pk-yrs)    Types: Cigarettes    Start date: 06/09/1976    Quit date: 06/09/2017    Years since quitting: 5.8   Smokeless tobacco: Never  Substance Use Topics   Alcohol use: No    FAMILY HISTORY:   Family History  Problem Relation Age of Onset   Lung cancer Mother    Asthma Mother    CVA Father  CAD Father    Stroke Father    Breast cancer Maternal Aunt 89   COPD Sister     DRUG ALLERGIES:   Allergies  Allergen Reactions   Amoxicillin     Patient has tolerated cephalosporins in the past   Ativan [Lorazepam]     Makes anxiety worse     REVIEW OF SYSTEMS:   ROS As per history of present illness. All pertinent systems were reviewed above. Constitutional, HEENT, cardiovascular, respiratory, GI, GU, musculoskeletal, neuro, psychiatric, endocrine, integumentary and hematologic systems were reviewed and are otherwise negative/unremarkable except for positive findings mentioned above in the HPI.   MEDICATIONS AT HOME:   Prior  to Admission medications   Medication Sig Start Date End Date Taking? Authorizing Provider  albuterol (PROVENTIL) (2.5 MG/3ML) 0.083% nebulizer solution Take 3 mLs (2.5 mg total) by nebulization every 6 (six) hours as needed for wheezing or shortness of breath. 03/27/23   Salena Saner, MD  albuterol (VENTOLIN HFA) 108 (90 Base) MCG/ACT inhaler Inhale 2 puffs into the lungs every 6 (six) hours as needed for wheezing or shortness of breath. 04/02/22   Parrett, Virgel Bouquet, NP  amLODipine (NORVASC) 5 MG tablet Take 5 mg by mouth daily. 08/27/21   [provider]  aspirin 81 MG chewable tablet Chew 81 mg by mouth daily.     [provider]  azelastine (ASTELIN) 0.1 % nasal spray SMARTSIG:1-2 Spray(s) Both Nares Every 12 Hours PRN 09/22/20   [provider]  benzonatate (TESSALON PERLES) 100 MG capsule Take 1-2 capsules (100-200 mg total) by mouth 3 (three) times daily as needed for cough. 02/22/23   Sunnie Nielsen, DO  Blood Glucose Monitoring Suppl ([employer] BLOOD GLUCOSE METER) w/Device KIT Use as directed to monitor blood glucose up to qid 02/22/23   Sunnie Nielsen, DO  cetirizine (ZYRTEC) 10 MG tablet Take 10 mg by mouth daily. 03/21/22   [provider]  clonazePAM (KLONOPIN) 0.5 MG tablet Take 0.5 mg by mouth in the morning, at noon, and at bedtime.    [provider]  cloNIDine (CATAPRES - DOSED IN MG/24 HR) 0.1 mg/24hr patch Place 0.1 mg onto the skin once a week.    [provider]  D3-50 1.25 MG (50000 UT) capsule Take 50,000 Units by mouth once a week. 12/28/22   [provider]  fluticasone (FLONASE) 50 MCG/ACT nasal spray Place 1 spray into both nostrils daily.     [provider]  guaiFENesin (MUCINEX) 600 MG 12 hr tablet Take 600 mg by mouth 2 (two) times daily.    [provider]  hydrOXYzine (VISTARIL) 25 MG capsule Take 25 mg by mouth every 6 (six) hours as needed. 10/01/22   [provider]   ipratropium-albuterol (DUONEB) 0.5-2.5 (3) MG/3ML SOLN Take 3 mLs by nebulization every 6 (six) hours. Patient taking differently: Take 3 mLs by nebulization every 6 (six) hours as needed (for shortness of breath). 12/31/17   Conforti, John, DO  LANTUS SOLOSTAR 100 UNIT/ML Solostar Pen Inject 10 Units into the skin at bedtime. 01/03/23   [provider]  mirtazapine (REMERON) 30 MG tablet Take 30 mg by mouth at bedtime. 01/19/21   [provider]  mupirocin ointment (BACTROBAN) 2 % Apply 1 Application topically daily. To inside nose and to affected area of axilla 05/21/22   Moye, [place], MD  naloxone [facility]) nasal spray 4 mg/0.1 mL Place 1 spray into the nose once. 10/22/22   [provider]  Rubbie Battiest  FLEXPEN 100 UNIT/ML FlexPen Inject into the skin 3 (three) times daily with meals. Sliding scale mealtime insulin. 03/01/21   [provider]  nystatin ointment (MYCOSTATIN) Apply 1 Application topically 3 (three) times daily. 01/04/23   [provider]  oxyCODONE-acetaminophen (PERCOCET/ROXICET) 5-325 MG tablet Take 1 tablet by mouth 3 (three) times daily as needed for pain. 09/07/21   [provider]  PARoxetine (PAXIL) 40 MG tablet Take 40 mg by mouth every morning. Give 30 mg x 1 week (1.5 tab of 20 mg) then start 40 mg daily. D/c 20 mg pills at that time.    [provider]  polyethylene glycol powder (GLYCOLAX/MIRALAX) 17 GM/SCOOP powder Take by mouth.    [provider]  predniSONE (DELTASONE) 10 MG tablet Take 10 mg by mouth as needed.    [provider]  PULMICORT 0.5 MG/2ML nebulizer solution SMARTSIG:1 Via Nebulizer Twice Daily 02/13/21   [provider]  terbinafine (LAMISIL) 1 % cream Apply 1 application topically 2 (two) times daily. To affected skin, for 1 week after resolution of rash. # 30 gm, 1 refill.    [provider]  TRULICITY 4.5 MG/0.5ML SOPN Inject 4.5 mg into the skin once a week. 09/28/21    [provider]  Vitamin D, Ergocalciferol, (DRISDOL) 1.25 MG (50000 UNIT) CAPS capsule Take 50,000 Units by mouth 2 (two) times a week.    [provider]  fluconazole (DIFLUCAN) 150 MG tablet Take 1 tablet day 1 repeat in 1 week 11/15/20   Moye, [place], MD      VITAL SIGNS:  Blood pressure 98/65, pulse 60, temperature 98 F (36.7 C), temperature source Oral, resp. rate 16, height 5' (1.524 m), weight 78.2 kg, last menstrual period 03/06/2005, SpO2 100%.  PHYSICAL EXAMINATION:  Physical Exam  GENERAL:  61 y.o.-year-old Caucasian female patient lying in the bed with no acute distress.  EYES: Pupils equal, round, reactive to light and accommodation. No scleral icterus. Extraocular muscles intact.  HEENT: Head atraumatic, normocephalic. Oropharynx and nasopharynx clear.  NECK:  Supple, no jugular venous distention. No thyroid enlargement, no tenderness.  LUNGS: Diminished right base of breath sounds with right basal crackles.  No use of accessory muscles of respiration.  CARDIOVASCULAR: Regular rate and rhythm, S1, S2 normal. No murmurs, rubs, or gallops.  ABDOMEN: Soft, nondistended, nontender. Bowel sounds present. No organomegaly or mass.  EXTREMITIES: No pedal edema, cyanosis, or clubbing.  NEUROLOGIC: Cranial nerves II through XII are intact. Muscle strength 5/5 in all extremities. Sensation intact. Gait not checked.  PSYCHIATRIC: The patient is alert and oriented x 3.  Normal affect and good eye contact. SKIN: No obvious rash, lesion, or ulcer.   LABORATORY PANEL:   CBC Recent Labs  Lab 04/19/23 0436  WBC 6.5  HGB 9.4*  HCT 30.3*  PLT 166   ------------------------------------------------------------------------------------------------------------------  Chemistries  Recent Labs  Lab 04/18/23 2225 04/19/23 0436  NA 137 136  K 3.4* 3.3*  CL 100 105  CO2 26 24  GLUCOSE 221* 286*  BUN 8 7*  CREATININE 0.61 0.68  CALCIUM 9.0 7.6*  AST 16  --    ALT 20  --   ALKPHOS 80  --   BILITOT 0.8  --    ------------------------------------------------------------------------------------------------------------------  Cardiac Enzymes No results for input(s): "TROPONINI" in the last 168 hours. ------------------------------------------------------------------------------------------------------------------  RADIOLOGY:  CT ABDOMEN PELVIS W CONTRAST  Result Date: 04/19/2023 CLINICAL DATA:  Right lower quadrant pain that worsens on palpation EXAM: CT ABDOMEN  AND PELVIS WITH CONTRAST TECHNIQUE: Multidetector CT imaging of the abdomen and pelvis was performed using the standard protocol following bolus administration of intravenous contrast. RADIATION DOSE REDUCTION: This exam was performed according to the departmental dose-optimization program which includes automated exposure control, adjustment of the mA and/or kV according to patient size and/or use of iterative reconstruction technique. CONTRAST:  OMNIPAQUE IOHEXOL 300 MG/ML  SOLN COMPARISON:  CT abdomen and pelvis 10/15/2022 FINDINGS: Lower chest: Emphysema.  Bibasilar atelectasis. Hepatobiliary: Hepatic steatosis. Gallbladder and biliary tree are unremarkable. Pancreas: Slightly increased prominence of the pancreatic duct measuring 6 mm. No peripancreatic fluid or inflammation. Spleen: Unremarkable. Adrenals/Urinary Tract: Unremarkable adrenal glands. No urinary calculi or hydronephrosis. Nondistended bladder. Stomach/Bowel: Stomach is within normal limits. No bowel obstruction or bowel wall thickening. Normal appendix. Vascular/Lymphatic: Aortic atherosclerosis. No enlarged abdominal or pelvic lymph nodes. Reproductive: Uterus and adnexa are unremarkable. Tubal ligation clips. Other: No free intraperitoneal fluid or air. Musculoskeletal: No acute fracture. IMPRESSION: 1. No acute abnormality in the abdomen or pelvis. Normal appendix. 2. Slightly increased prominence of the pancreatic duct  measuring 6 mm. Consider further evaluation with nonemergent MRI is MRCP. 3. Hepatic steatosis. Aortic Atherosclerosis (ICD10-I70.0) and Emphysema (ICD10-J43.9). Electronically Signed   By: Minerva Fester M.D.   On: 04/19/2023 01:07   DG Chest Port 1 View  Result Date: 04/18/2023 CLINICAL DATA:  Shortness of breath EXAM: PORTABLE CHEST 1 VIEW COMPARISON:  03/27/2023, 12/31/2022, CT 09/30/2021 FINDINGS: Tracheostomy tube tip about 8 cm superior to carina. Hyperinflation with emphysema. Increased right basilar opacity. Possible right apical lung nodule. Normal cardiac size. Aortic atherosclerosis IMPRESSION: 1. Hyperinflation with emphysema. Increased right basilar opacity, possible atelectasis or pneumonia. 2. Possible right apical lung nodule. Recommend further evaluation with chest CT. Electronically Signed   By: Jasmine Pang M.D.   On: 04/18/2023 23:56      IMPRESSION AND PLAN:  Assessment and Plan: * Sepsis due to pneumonia [facility]) - This could be partly related to acute lower UTI as well. - The patient will be admitted to a progressive unit bed. - Will continue antibiotic therapy with IV Rocephin and Zithromax. - Mucolytic therapy be provided as well as duo nebs q.i.d. and q.4 hours p.r.n. - We will follow blood cultures. - Sepsis manifested by tachycardia and tachypnea. - Will continue hydration with IV lactated ringer.  Abnormal CT scan - This involves the abdominal CT scan that revealed a 6 mm pancreatic ductal dilatation as well as lung nodule that will need a nonurgent follow-up is recommended.  Type 2 diabetes mellitus without complications (HCC) - The patient will be placed on supplemental coverage with NovoLog. - We will continue basal coverage.  Anxiety and depression Will continue her Paxil and Klonopin.  Chronic obstructive pulmonary disease (COPD) (HCC) - We will place the patient on scheduled and as needed DuoNebs in addition to mucolytic therapy  Essential  hypertension - We will continue antihypertensive therapy.       DVT prophylaxis: Lovenox.  Advanced Care Planning:  Code Status: The patient is DNR and DNI. Family Communication:  The plan of care was discussed in details with the patient (and family). I answered all questions. The patient agreed to proceed with the above mentioned plan. Further management will depend upon hospital course. Disposition Plan: Back to previous home environment Consults called: none.  All the records are reviewed and case discussed with ED provider.  Status is: Inpatient    At the time of the admission, it appears that  the appropriate admission status for this patient is inpatient.  This is judged to be reasonable and necessary in order to provide the required intensity of service to ensure the patient's safety given the presenting symptoms, physical exam findings and initial radiographic and laboratory data in the context of comorbid conditions.  The patient requires inpatient status due to high intensity of service, high risk of further deterioration and high frequency of surveillance required.  I certify that at the time of admission, it is my clinical judgment that the patient will require inpatient hospital care extending more than 2 midnights.                            Dispo: The patient is from: Home              Anticipated d/c is to: Home              Patient currently is not medically stable to d/c.              Difficult to place patient: No  Hannah Beat M.D on 04/19/2023 at 8:45 AM  [facility]   From 7 PM-7 AM, contact night-coverage [email or web address]  CC: Primary care physician; Abram Sander, MD

## 2023-04-19 NOTE — ED Notes (Signed)
Amin, MD at bedside

## 2023-04-19 NOTE — Assessment & Plan Note (Signed)
Will continue her Paxil and Klonopin.

## 2023-04-19 NOTE — ED Notes (Signed)
Patient taken to CT and back with RT due to home vent without incident. Back to room, connected to monitor including CCM, VSS, call light within reach.

## 2023-04-19 NOTE — ED Notes (Signed)
Pt given a full linen change, chuck applied. Peri care provided and new brief applied. Purewick placed. Warm blanket given. Call light within reach, bed in lowest position and family at bedside

## 2023-04-19 NOTE — ED Provider Notes (Signed)
Evidence of pneumonia on chest x-ray.  Added blood culture, lactic, 30 cc/kg fluid bolus, community-acquired pneumonia IV antibiotic coverage, as patient's heart rate and respiratory rate concerning for sepsis.  Awaiting the abdominal CT prior to admission.   Pilar Jarvis, MD 04/19/23 [phone]

## 2023-04-19 NOTE — ED Notes (Signed)
Rt at bedside

## 2023-04-19 NOTE — ED Notes (Signed)
Pt is sleeping in bed. Vitals WDL, NAD, Call light within reach bed in lowest position

## 2023-04-19 NOTE — Progress Notes (Signed)
Patient has no order for use of home vent and she is listed to go to 2A.  Spoke with provider B Jon Billings who gave me verbal to place order for home vent usage. She also states she will have patient admitted to step down due to her diagnosis and continuous vent usage.

## 2023-04-19 NOTE — Assessment & Plan Note (Signed)
-   We will continue anti-hypertensive therapy. 

## 2023-04-19 NOTE — ED Notes (Signed)
Patient given crackers and sprite after confirming with Arville Care, MD via secure chat that patient is okay to eat and drink.

## 2023-04-19 NOTE — Assessment & Plan Note (Addendum)
-   This could be partly related to acute lower UTI as well. - The patient will be admitted to a progressive unit bed. - Will continue antibiotic therapy with IV Rocephin and Zithromax. - Mucolytic therapy be provided as well as duo nebs q.i.d. and q.4 hours p.r.n. - We will follow blood cultures. - Sepsis manifested by tachycardia and tachypnea. - Will continue hydration with IV lactated ringer.

## 2023-04-19 NOTE — ED Notes (Signed)
Patient medicated, morning labs drawn. VSS, CCM in use, call light within reach. No other needs identified at this time.

## 2023-04-19 NOTE — Hospital Course (Addendum)
  Brief Narrative:  62 year old with history of asthma, COPD, CAD, HTN, HLD, chronic respiratory failure with tracheostomy presents to the ED with cough, congestion.  CT abdomen pelvis unremarkable.  Diagnosed with sepsis likely secondary to pneumonia.  Started on IV Rocephin and azithromycin.   Assessment & Plan:  Principal Problem:   Sepsis due to pneumonia [facility]) Active Problems:   Essential hypertension   Chronic obstructive pulmonary disease (COPD) (HCC)   Anxiety and depression   Type 2 diabetes mellitus without complications (HCC)   Abnormal CT scan   Sepsis due to pneumonia (HCC) Chronic respiratory failure with tracheostomy Sepsis physiology improving.  Adjust antibiotics as appropriate.Bronchodilators scheduled and as needed.  Procalcitonin and BNP negative.  Still feeling short of breath, will give 1 dose of Lasix IV, add Pulmicort COVID/flu/RSV/Resp Panel negative. Sputum Cultures sent Bcx= Stap Epi. Likely a contaminate.   Hypokalemia - As needed repletion.   Abnormal CT scan, dilated pancreatic duct Recommend outpatient MRI/MRCP. Possible she may have passed a stone. No longer having abd pain.  Normal LFTs   Type 2 diabetes mellitus without complications (HCC) Sliding scale and Accu-Chek.  Currently on Semglee.  Adjust as needed   Anxiety and depression Paxil and Klonopin.   Chronic obstructive pulmonary disease (COPD) (HCC) As needed nebs   Essential hypertension Norvasc, clonidine.  IV as needed   DVT prophylaxis: enoxaparin (LOVENOX) injection 40 mg Start: 04/19/23 1000 Code Status: DNR Family Communication:  daughter updated.  Status is: Inpatient Remains inpatient appropriate because: Cont hosp stay for abnormal BS    Subjective: Patient seen at bedside, tells me she is feeling more short of breath today.   Examination:  General exam: Appears calm and comfortable, tracheostomy in place Respiratory system: Diminished breath sounds  bilaterally Cardiovascular system: S1 & S2 heard, RRR. No JVD, murmurs, rubs, gallops or clicks. No pedal edema. Gastrointestinal system: Abdomen is nondistended, soft and nontender. No organomegaly or masses felt. Normal bowel sounds heard. Central nervous system: Alert and oriented. No focal neurological deficits. Extremities: Symmetric 5 x 5 power. Skin: No rashes, lesions or ulcers Psychiatry: Judgement and insight appear normal. Mood & affect appropriate. Trach in place

## 2023-04-19 NOTE — TOC Initial Note (Signed)
Transition of Care [facility]) - Initial/Assessment Note    Patient Details  Name: Michelle [name] MRN: [id number] Date of Birth: 25-Dec-1960  Transition of Care [facility]) CM/SW Contact:    Marquita Palms, LCSW Phone Number: 04/19/2023, 2:16 PM  Clinical Narrative:                  CSW spoke with patient bedside. Patient has tube attached and unable to speak clearly. Patient reports she lives with her daughter and uses Water engineer. She reports she has a walker at home but needs a cane. CSW contacted [employer] to see about having one delivered. Patient reports being in the hospital 1 month ago for similar reason. Patient reports she is agreeable to Home health for PT and OT if presented.       Patient Goals and CMS Choice            Expected Discharge Plan and Services                                              Prior Living Arrangements/Services                       Activities of Daily Living   ADL Screening (condition at time of admission) Independently performs ADLs?: No Does the patient have a NEW difficulty with bathing/dressing/toileting/self-feeding that is expected to last >3 days?: No Does the patient have a NEW difficulty with getting in/out of bed, walking, or climbing stairs that is expected to last >3 days?: No Does the patient have a NEW difficulty with communication that is expected to last >3 days?: No Is the patient deaf or have difficulty hearing?: No Does the patient have difficulty seeing, even when wearing glasses/contacts?: No Does the patient have difficulty concentrating, remembering, or making decisions?: No  Permission Sought/Granted                  Emotional Assessment              Admission diagnosis:  Sepsis due to pneumonia (HCC) [J18.9, A41.9] Patient Active Problem List   Diagnosis Date Noted   Sepsis due to pneumonia (HCC) 04/19/2023   Essential hypertension 04/19/2023   Chronic obstructive pulmonary  disease (COPD) (HCC) 04/19/2023   Anxiety and depression 04/19/2023   Type 2 diabetes mellitus without complications (HCC) 04/19/2023   Abnormal CT scan 04/19/2023   Obesity (BMI 30-39.9) 02/19/2023   Dysuria 02/18/2023   Elevated troponin 02/18/2023   COVID-19 virus infection 02/18/2023   Tracheostomy status (HCC) 04/02/2022   Acute on chronic respiratory failure (HCC) 09/30/2021   Urine retention 02/07/2021   Candidiasis of breast 11/15/2020   Atypical pneumonia 08/29/2019   HCAP (healthcare-associated pneumonia) 08/28/2019   Pseudomonas respiratory infection 07/03/2019   Depression with anxiety 07/03/2019   Hyperlipidemia associated with type 2 diabetes mellitus (HCC) 07/03/2019   PEG (percutaneous endoscopic gastrostomy) status (HCC) 07/03/2019   Chronic pain 07/03/2019   Hypokalemia 07/03/2019   DM (diabetes mellitus) (HCC) 06/24/2019   Chronic respiratory failure requiring continuous mechanical ventilation through tracheostomy (HCC) 10/20/2018   CAD (coronary artery disease) 10/20/2018   HTN (hypertension) 10/20/2018   Chronic diastolic CHF (congestive heart failure) (HCC) 10/20/2018   Pneumonia 09/17/2018   Panic disorder 10/29/2017   Altered mental status    Pressure injury of skin 10/23/2017  Acute on chronic respiratory failure with hypoxia and hypercapnia (HCC) 06/29/2017   Acute on chronic respiratory failure with hypoxia (HCC) 02/23/2017   Protein-calorie malnutrition, severe 05/23/2016   Sepsis (HCC) 05/22/2016   Cocaine abuse (HCC) 04/07/2015   Malnutrition of moderate degree (HCC) 10/25/2014   COPD (chronic obstructive pulmonary disease) (HCC) 10/24/2014   Tobacco abuse 10/24/2014   Community acquired pneumonia 10/24/2014   PCP:  Abram Sander, MD Pharmacy:   [facility] [place], [place] - [address] [place] [address] Phone: [phone] Fax: [phone]  [facility] [address], [place] - [address]  [place] [address] Phone: [phone] Fax: [phone]  [id number] [place], [place] - [address] [place] [address] Phone: [phone] Fax: [phone]     Social Determinants of Health (SDOH) Social History: SDOH Screenings   Food Insecurity: Patient Unable To Answer (04/19/2023)  Recent Concern: Food Insecurity - Food Insecurity Present (02/18/2023)  Housing: Patient Unable To Answer (04/19/2023)  Transportation Needs: Patient Unable To Answer (04/19/2023)  Utilities: Patient Unable To Answer (04/19/2023)  Financial Resource Strain: Medium Risk (06/29/2017)  Physical Activity: Inactive (06/29/2017)  Social Connections: Unknown (06/29/2017)  Stress: Stress Concern Present (06/29/2017)  Tobacco Use: Medium Risk (04/19/2023)   SDOH Interventions:     Readmission Risk Interventions    04/19/2023    2:10 PM 02/22/2023    1:47 PM 10/03/2021    3:58 PM  Readmission Risk Prevention Plan  Transportation Screening Complete Complete Complete  PCP or Specialist Appt within 3-5 Days Complete Complete Complete  HRI or Home Care Consult Complete Complete   Social Work Consult for Recovery Care Planning/Counseling Complete Complete Complete  Palliative Care Screening Complete Complete Not Applicable  Medication Review Oceanographer) Complete Complete Complete

## 2023-04-19 NOTE — Progress Notes (Signed)
Pt taken to CT on home vent with 8L O2 bleed in and returned to ED #3 without incident. Pt remains on home vent and is tol well.

## 2023-04-19 NOTE — Progress Notes (Signed)
PROGRESS NOTE    Michelle [name]  HQI:[id number] DOB: June 15, 1960 DOA: 04/18/2023 PCP: Abram Sander, MD     Brief Narrative:  62 year old with history of asthma, COPD, CAD, HTN, HLD, chronic respiratory failure with tracheostomy presents to the ED with cough, congestion.  CT abdomen pelvis unremarkable.  Diagnosed with sepsis likely secondary to pneumonia.  Started on IV Rocephin and azithromycin.   Assessment & Plan:  Principal Problem:   Sepsis due to pneumonia [facility]) Active Problems:   Essential hypertension   Chronic obstructive pulmonary disease (COPD) (HCC)   Anxiety and depression   Type 2 diabetes mellitus without complications (HCC)   Abnormal CT scan   Sepsis due to pneumonia (HCC) Chronic respiratory failure with tracheostomy Sepsis physiology improving.  Adjust antibiotics as appropriate.  Getting IV fluids.  Bronchodilators scheduled and as needed.  Procalcitonin and BNP negative. COVID/flu/RSV negative. Check Resp Panel.  Will send sputum cultures  Hypokalemia - As needed repletion.   Abnormal CT scan, dilated pancreatic duct Recommend outpatient MRI/MRCP. Possible she may have passed a stone. No longer having abd pain.  Normal LFTs   Type 2 diabetes mellitus without complications (HCC) Sliding scale and Accu-Chek.  Currently on Semglee.  Adjust as needed   Anxiety and depression Paxil and Klonopin.   Chronic obstructive pulmonary disease (COPD) (HCC) As needed nebs   Essential hypertension Norvasc, clonidine.  IV as needed   DVT prophylaxis: enoxaparin (LOVENOX) injection 40 mg Start: 04/19/23 1000 Code Status: DNR Family Communication:  daughter updated.  Status is: Inpatient Remains inpatient appropriate because: Cont hosp stay for abnormal BS    Subjective: Feels better since yesterday. Coughing is improving.    Examination:  General exam: Appears calm and comfortable  Respiratory system: Clear to auscultation. Respiratory effort  normal. Cardiovascular system: S1 & S2 heard, RRR. No JVD, murmurs, rubs, gallops or clicks. No pedal edema. Gastrointestinal system: Abdomen is nondistended, soft and nontender. No organomegaly or masses felt. Normal bowel sounds heard. Central nervous system: Alert and oriented. No focal neurological deficits. Extremities: Symmetric 5 x 5 power. Skin: No rashes, lesions or ulcers Psychiatry: Judgement and insight appear normal. Mood & affect appropriate. Trach in place                Diet Orders (From admission, onward)     Start     Ordered   04/19/23 0345  Diet Heart Room service appropriate? Yes; Fluid consistency: Thin  Diet effective now       Question Answer Comment  Room service appropriate? Yes   Fluid consistency: Thin      04/19/23 0346            Objective: Vitals:   04/19/23 0800 04/19/23 0807 04/19/23 0930 04/19/23 1105  BP: 98/65   90/65  Pulse: 60   66  Resp: 16   16  Temp:   98.1 F (36.7 C)   TempSrc:   Oral   SpO2: 100% 100%  100%  Weight:      Height:        Intake/Output Summary (Last 24 hours) at 04/19/2023 1321 Last data filed at 04/19/2023 0930 Gross per 24 hour  Intake 822.98 ml  Output --  Net 822.98 ml   Filed Weights   04/18/23 2201  Weight: 78.2 kg    Scheduled Meds:  amLODipine  5 mg Oral Daily   aspirin  81 mg Oral Daily   Cholecalciferol  50,000 Units Oral Weekly   cloNIDine  0.1 mg Transdermal Weekly   dextromethorphan-guaiFENesin  1 tablet Oral BID   enoxaparin (LOVENOX) injection  40 mg Subcutaneous Q24H   fluticasone  1 spray Each Nare Daily   insulin glargine-yfgn  10 Units Subcutaneous QHS   ipratropium-albuterol  3 mL Nebulization Q6H   loratadine  10 mg Oral Daily   mirtazapine  30 mg Oral QHS   PARoxetine  40 mg Oral BH-q7a   Continuous Infusions:  azithromycin     cefTRIAXone (ROCEPHIN)  IV      Nutritional status     Body mass index is 33.69 kg/m.  Data Reviewed:   CBC: Recent Labs   Lab 04/18/23 2225 04/19/23 0436  WBC 9.4 6.5  NEUTROABS 6.1  --   HGB 11.8* 9.4*  HCT 37.1 30.3*  MCV 83.0 86.6  PLT 232 166   Basic Metabolic Panel: Recent Labs  Lab 04/18/23 2225 04/19/23 0436  NA 137 136  K 3.4* 3.3*  CL 100 105  CO2 26 24  GLUCOSE 221* 286*  BUN 8 7*  CREATININE 0.61 0.68  CALCIUM 9.0 7.6*   GFR: Estimated Creatinine Clearance: 68.3 mL/min (by C-G formula based on SCr of 0.68 mg/dL). Liver Function Tests: Recent Labs  Lab 04/18/23 2225  AST 16  ALT 20  ALKPHOS 80  BILITOT 0.8  PROT 7.7  ALBUMIN 3.6   Recent Labs  Lab 04/18/23 2225  LIPASE 35   No results for input(s): "AMMONIA" in the last 168 hours. Coagulation Profile: Recent Labs  Lab 04/19/23 0436  INR 1.1   Cardiac Enzymes: No results for input(s): "CKTOTAL", "CKMB", "CKMBINDEX", "TROPONINI" in the last 168 hours. BNP (last 3 results) No results for input(s): "PROBNP" in the last 8760 hours. HbA1C: No results for input(s): "HGBA1C" in the last 72 hours. CBG: No results for input(s): "GLUCAP" in the last 168 hours. Lipid Profile: No results for input(s): "CHOL", "HDL", "LDLCALC", "TRIG", "CHOLHDL", "LDLDIRECT" in the last 72 hours. Thyroid Function Tests: No results for input(s): "TSH", "T4TOTAL", "FREET4", "T3FREE", "THYROIDAB" in the last 72 hours. Anemia Panel: No results for input(s): "VITAMINB12", "FOLATE", "FERRITIN", "TIBC", "IRON", "RETICCTPCT" in the last 72 hours. Sepsis Labs: Recent Labs  Lab 04/18/23 2225 04/19/23 0436  PROCALCITON  --  <0.10  LATICACIDVEN 1.0  --     Recent Results (from the past 240 hour(s))  Resp panel by RT-PCR (RSV, Flu A&B, Covid) Anterior Nasal Swab     Status: None   Collection Time: 04/18/23 10:25 PM   Specimen: Anterior Nasal Swab  Result Value Ref Range Status   SARS Coronavirus 2 by RT PCR NEGATIVE NEGATIVE Final    Comment: (NOTE) SARS-CoV-2 target nucleic acids are NOT DETECTED.  The SARS-CoV-2 RNA is generally  detectable in upper respiratory specimens during the acute phase of infection. The lowest concentration of SARS-CoV-2 viral copies this assay can detect is 138 copies/mL. A negative result does not preclude SARS-Cov-2 infection and should not be used as the sole basis for treatment or other patient management decisions. A negative result may occur with  improper specimen collection/handling, submission of specimen other than nasopharyngeal swab, presence of viral mutation(s) within the areas targeted by this assay, and inadequate number of viral copies(<138 copies/mL). A negative result must be combined with clinical observations, patient history, and epidemiological information. The expected result is Negative.  Fact Sheet for Patients:  BloggerCourse.com  Fact Sheet for Healthcare Providers:  SeriousBroker.it  This test is no t yet approved or cleared by the  [place] [employer] trader and  has been authorized for detection and/or diagnosis of SARS-CoV-2 by FDA under an TEFL teacher (EUA). This EUA will remain  in effect (meaning this test can be used) for the duration of the COVID-19 declaration under Section 564(b)(1) of the Act, 21 U.S.C.section 360bbb-3(b)(1), unless the authorization is terminated  or revoked sooner.       Influenza A by PCR NEGATIVE NEGATIVE Final   Influenza B by PCR NEGATIVE NEGATIVE Final    Comment: (NOTE) The Xpert Xpress SARS-CoV-2/FLU/RSV plus assay is intended as an aid in the diagnosis of influenza from Nasopharyngeal swab specimens and should not be used as a sole basis for treatment. Nasal washings and aspirates are unacceptable for Xpert Xpress SARS-CoV-2/FLU/RSV testing.  Fact Sheet for Patients: BloggerCourse.com  Fact Sheet for Healthcare Providers: SeriousBroker.it  This test is not yet approved or cleared by the [place] FDA  and has been authorized for detection and/or diagnosis of SARS-CoV-2 by FDA under an Emergency Use Authorization (EUA). This EUA will remain in effect (meaning this test can be used) for the duration of the COVID-19 declaration under Section 564(b)(1) of the Act, 21 U.S.C. section 360bbb-3(b)(1), unless the authorization is terminated or revoked.     Resp Syncytial Virus by PCR NEGATIVE NEGATIVE Final    Comment: (NOTE) Fact Sheet for Patients: BloggerCourse.com  Fact Sheet for Healthcare Providers: SeriousBroker.it  This test is not yet approved or cleared by the [place] FDA and has been authorized for detection and/or diagnosis of SARS-CoV-2 by FDA under an Emergency Use Authorization (EUA). This EUA will remain in effect (meaning this test can be used) for the duration of the COVID-19 declaration under Section 564(b)(1) of the Act, 21 U.S.C. section 360bbb-3(b)(1), unless the authorization is terminated or revoked.  Performed at [facility], [address] [place], [place] [address]   Blood Culture (routine x 2)     Status: None (Preliminary result)   Collection Time: 04/18/23 11:35 PM   Specimen: BLOOD  Result Value Ref Range Status   Specimen Description BLOOD BLOOD RIGHT FOREARM  Final   Special Requests   Final    BOTTLES DRAWN AEROBIC AND ANAEROBIC Blood Culture results may not be optimal due to an excessive volume of blood received in culture bottles   Culture   Final    NO GROWTH < 12 HOURS Performed at [facility], [address], [place], [place] [address]    Report Status PENDING  Incomplete  Blood Culture (routine x 2)     Status: None (Preliminary result)   Collection Time: 04/19/23  1:16 AM   Specimen: BLOOD  Result Value Ref Range Status   Specimen Description BLOOD BLOOD LEFT ARM  Final   Special Requests   Final    BOTTLES DRAWN AEROBIC AND ANAEROBIC Blood Culture adequate  volume   Culture   Final    NO GROWTH < 12 HOURS Performed at [facility], [address] [place], [place] [address]    Report Status PENDING  Incomplete  MRSA Next Gen by PCR, Nasal     Status: None   Collection Time: 04/19/23  8:13 AM   Specimen: Nasal Mucosa; Nasal Swab  Result Value Ref Range Status   MRSA by PCR Next Gen NOT DETECTED NOT DETECTED Final    Comment: (NOTE) The GeneXpert MRSA Assay (FDA approved for NASAL specimens only), is one component of a comprehensive MRSA colonization surveillance program. It is not  intended to diagnose MRSA infection nor to guide or monitor treatment for MRSA infections. Test performance is not FDA approved in patients less than 73 years old. Performed at [facility], [address] [place], [place] [address]          Radiology Studies: CT ABDOMEN PELVIS W CONTRAST  Result Date: 04/19/2023 CLINICAL DATA:  Right lower quadrant pain that worsens on palpation EXAM: CT ABDOMEN AND PELVIS WITH CONTRAST TECHNIQUE: Multidetector CT imaging of the abdomen and pelvis was performed using the standard protocol following bolus administration of intravenous contrast. RADIATION DOSE REDUCTION: This exam was performed according to the departmental dose-optimization program which includes automated exposure control, adjustment of the mA and/or kV according to patient size and/or use of iterative reconstruction technique. CONTRAST:  OMNIPAQUE IOHEXOL 300 MG/ML  SOLN COMPARISON:  CT abdomen and pelvis 10/15/2022 FINDINGS: Lower chest: Emphysema.  Bibasilar atelectasis. Hepatobiliary: Hepatic steatosis. Gallbladder and biliary tree are unremarkable. Pancreas: Slightly increased prominence of the pancreatic duct measuring 6 mm. No peripancreatic fluid or inflammation. Spleen: Unremarkable. Adrenals/Urinary Tract: Unremarkable adrenal glands. No urinary calculi or hydronephrosis. Nondistended bladder. Stomach/Bowel: Stomach is within  normal limits. No bowel obstruction or bowel wall thickening. Normal appendix. Vascular/Lymphatic: Aortic atherosclerosis. No enlarged abdominal or pelvic lymph nodes. Reproductive: Uterus and adnexa are unremarkable. Tubal ligation clips. Other: No free intraperitoneal fluid or air. Musculoskeletal: No acute fracture. IMPRESSION: 1. No acute abnormality in the abdomen or pelvis. Normal appendix. 2. Slightly increased prominence of the pancreatic duct measuring 6 mm. Consider further evaluation with nonemergent MRI is MRCP. 3. Hepatic steatosis. Aortic Atherosclerosis (ICD10-I70.0) and Emphysema (ICD10-J43.9). Electronically Signed   By: Minerva Fester M.D.   On: 04/19/2023 01:07   DG Chest Port 1 View  Result Date: 04/18/2023 CLINICAL DATA:  Shortness of breath EXAM: PORTABLE CHEST 1 VIEW COMPARISON:  03/27/2023, 12/31/2022, CT 09/30/2021 FINDINGS: Tracheostomy tube tip about 8 cm superior to carina. Hyperinflation with emphysema. Increased right basilar opacity. Possible right apical lung nodule. Normal cardiac size. Aortic atherosclerosis IMPRESSION: 1. Hyperinflation with emphysema. Increased right basilar opacity, possible atelectasis or pneumonia. 2. Possible right apical lung nodule. Recommend further evaluation with chest CT. Electronically Signed   By: Jasmine Pang M.D.   On: 04/18/2023 23:56           LOS: 0 days   Time spent= 35 mins    Miguel Rota, MD [facility]  If 7PM-7AM, please contact night-coverage  04/19/2023, 1:21 PM

## 2023-04-20 ENCOUNTER — Inpatient Hospital Stay: Payer: Medicaid Other

## 2023-04-20 DIAGNOSIS — J9611 Chronic respiratory failure with hypoxia: Secondary | ICD-10-CM | POA: Diagnosis not present

## 2023-04-20 DIAGNOSIS — A419 Sepsis, unspecified organism: Principal | ICD-10-CM

## 2023-04-20 DIAGNOSIS — J189 Pneumonia, unspecified organism: Secondary | ICD-10-CM | POA: Diagnosis not present

## 2023-04-20 LAB — CBC
HCT: 30.8 % — ABNORMAL LOW (ref 36.0–46.0)
Hemoglobin: 9.4 g/dL — ABNORMAL LOW (ref 12.0–15.0)
MCH: 26.5 pg (ref 26.0–34.0)
MCHC: 30.5 g/dL (ref 30.0–36.0)
MCV: 86.8 fL (ref 80.0–100.0)
Platelets: 167 10*3/uL (ref 150–400)
RBC: 3.55 MIL/uL — ABNORMAL LOW (ref 3.87–5.11)
RDW: 13.8 % (ref 11.5–15.5)
WBC: 5 10*3/uL (ref 4.0–10.5)
nRBC: 0 % (ref 0.0–0.2)

## 2023-04-20 LAB — BLOOD CULTURE ID PANEL (REFLEXED) - BCID2

## 2023-04-20 LAB — MAGNESIUM: Magnesium: 1.8 mg/dL (ref 1.7–2.4)

## 2023-04-20 LAB — BASIC METABOLIC PANEL
Anion gap: 9 (ref 5–15)
BUN: 6 mg/dL — ABNORMAL LOW (ref 8–23)
CO2: 26 mmol/L (ref 22–32)
Calcium: 8.2 mg/dL — ABNORMAL LOW (ref 8.9–10.3)
Chloride: 106 mmol/L (ref 98–111)
Creatinine, Ser: 0.72 mg/dL (ref 0.44–1.00)
GFR, Estimated: >60 mL/min (ref >60)
Glucose, Bld: 193 mg/dL — ABNORMAL HIGH (ref 70–99)
Potassium: 3.6 mmol/L (ref 3.5–5.1)
Sodium: 141 mmol/L (ref 135–145)

## 2023-04-20 LAB — PHOSPHORUS: Phosphorus: 2.9 mg/dL (ref 2.5–4.6)

## 2023-04-20 LAB — CBG MONITORING, ED: Glucose-Capillary: 208 mg/dL — ABNORMAL HIGH (ref 70–99)

## 2023-04-20 LAB — GLUCOSE, CAPILLARY
Glucose-Capillary: 230 mg/dL — ABNORMAL HIGH (ref 70–99)
Glucose-Capillary: 205 mg/dL — ABNORMAL HIGH (ref 70–99)
Glucose-Capillary: 205 mg/dL — ABNORMAL HIGH (ref 70–99)

## 2023-04-20 LAB — BRAIN NATRIURETIC PEPTIDE: B Natriuretic Peptide: 58.9 pg/mL (ref 0.0–100.0)

## 2023-04-20 LAB — PROCALCITONIN: Procalcitonin: <0.1 ng/mL

## 2023-04-20 MED ORDER — SODIUM CHLORIDE 0.9 % IV SOLN
1.0000 g | INTRAVENOUS | Status: AC
  Administered 2023-04-20 – 2023-04-22 (×3): 1 g via INTRAVENOUS
  Filled 2023-04-20 (×4): qty 10

## 2023-04-20 MED ORDER — FUROSEMIDE 10 MG/ML IJ SOLN
20.0000 mg | Freq: Once | INTRAMUSCULAR | Status: AC
  Administered 2023-04-20: 20 mg via INTRAVENOUS
  Filled 2023-04-20: qty 4

## 2023-04-20 MED ORDER — VANCOMYCIN HCL IN DEXTROSE 1-5 GM/200ML-% IV SOLN: 1000.0000 mg | INTRAVENOUS | Status: DC

## 2023-04-20 MED ORDER — DEXTROSE 5 % IV SOLN
250.0000 mg | INTRAVENOUS | Status: DC
  Administered 2023-04-20 – 2023-04-21 (×2): 250 mg via INTRAVENOUS
  Filled 2023-04-20 (×4): qty 2.5

## 2023-04-20 MED ORDER — DEXTROSE 5 % IV SOLN: 500.0000 mg | INTRAVENOUS | Status: DC

## 2023-04-20 MED ORDER — INSULIN ASPART 100 UNIT/ML IJ SOLN
0.0000 [IU] | Freq: Three times a day (TID) | INTRAMUSCULAR | Status: DC
  Administered 2023-04-20 – 2023-04-21 (×4): 3 [IU] via SUBCUTANEOUS
  Administered 2023-04-21: 2 [IU] via SUBCUTANEOUS
  Administered 2023-04-22: 5 [IU] via SUBCUTANEOUS
  Administered 2023-04-22 – 2023-04-23 (×4): 2 [IU] via SUBCUTANEOUS
  Filled 2023-04-20 (×9): qty 1

## 2023-04-20 MED ORDER — CHLORHEXIDINE GLUCONATE CLOTH 2 % EX PADS
6.0000 | MEDICATED_PAD | Freq: Every day | CUTANEOUS | Status: DC
  Administered 2023-04-21 – 2023-04-23 (×3): 6 via TOPICAL

## 2023-04-20 MED ORDER — METOPROLOL TARTRATE 5 MG/5ML IV SOLN
5.0000 mg | INTRAVENOUS | Status: DC | PRN
  Administered 2023-04-20 – 2023-04-21 (×3): 5 mg via INTRAVENOUS
  Filled 2023-04-20 (×3): qty 5

## 2023-04-20 MED ORDER — POLYETHYLENE GLYCOL 3350 17 GM/SCOOP PO POWD
17.0000 g | Freq: Every day | ORAL | Status: DC
  Filled 2023-04-20: qty 255

## 2023-04-20 MED ORDER — BUDESONIDE 0.25 MG/2ML IN SUSP
0.2500 mg | Freq: Two times a day (BID) | RESPIRATORY_TRACT | Status: DC
  Administered 2023-04-20 – 2023-04-24 (×9): 0.25 mg via RESPIRATORY_TRACT
  Filled 2023-04-20 (×9): qty 2

## 2023-04-20 MED ORDER — VANCOMYCIN HCL 1250 MG/250ML IV SOLN
1250.0000 mg | INTRAVENOUS | Status: DC
  Administered 2023-04-20: 1250 mg via INTRAVENOUS
  Filled 2023-04-20: qty 250

## 2023-04-20 MED ORDER — POLYETHYLENE GLYCOL 3350 17 G PO PACK
17.0000 g | PACK | Freq: Every day | ORAL | Status: DC | PRN
  Administered 2023-04-20: 17 g via ORAL
  Filled 2023-04-20: qty 1

## 2023-04-20 MED ORDER — DOCUSATE SODIUM 100 MG PO CAPS
200.0000 mg | ORAL_CAPSULE | Freq: Every day | ORAL | Status: DC | PRN
  Administered 2023-04-20 – 2023-04-22 (×2): 200 mg via ORAL
  Filled 2023-04-20 (×2): qty 2

## 2023-04-20 MED ORDER — ALPRAZOLAM 0.5 MG PO TABS
0.5000 mg | ORAL_TABLET | Freq: Once | ORAL | Status: AC
  Administered 2023-04-21: 0.5 mg via ORAL
  Filled 2023-04-20 (×2): qty 1

## 2023-04-20 MED ORDER — HYDROXYZINE HCL 25 MG PO TABS
50.0000 mg | ORAL_TABLET | Freq: Four times a day (QID) | ORAL | Status: DC | PRN
  Administered 2023-04-20 – 2023-04-24 (×7): 50 mg via ORAL
  Filled 2023-04-20 (×7): qty 2

## 2023-04-20 NOTE — Progress Notes (Signed)
Pharmacy Antibiotic Note  Michelle [name] is a 62 y.o. female admitted on 04/18/2023 with bacteremia.  Pharmacy has been consulted for Vancomycin dosing.  Plan: Vancomycin 1250 mg IV Q 24 hrs. Goal AUC 400-550. Expected AUC: 458.4 SCr used: 0.8 (11/15 SCr = 0.68)  Pharmacy will continue to follow and will adjust abx dosing whenever warranted.  Temp (24hrs), Avg:98.1 F (36.7 C), Min:98 F (36.7 C), Max:98.2 F (36.8 C)   Recent Labs  Lab 04/18/23 2225 04/19/23 0436  WBC 9.4 6.5  CREATININE 0.61 0.68  LATICACIDVEN 1.0  --     Estimated Creatinine Clearance: 68.3 mL/min (by C-G formula based on SCr of 0.68 mg/dL).    Allergies  Allergen Reactions   Amoxicillin     Patient has tolerated cephalosporins in the past   Ativan [Lorazepam]     Makes anxiety worse     Antimicrobials this admission: 11/15 Azithromycin >>  11/15 Ceftriaxone >>  11/16 Vancomycin >>   Microbiology results: 11/15 BCx: 1 (aerobic) of 4 bottles w/ Staph Epi, mecA/C detected 11/15 Sputum: Pending   Thank you for allowing pharmacy to be a part of this patient's care.  Otelia Sergeant, PharmD, [facility] 04/20/2023 4:22 AM

## 2023-04-20 NOTE — Progress Notes (Signed)
PHARMACY - PHYSICIAN COMMUNICATION CRITICAL VALUE ALERT - BLOOD CULTURE IDENTIFICATION (BCID)  Results for orders placed or performed during the hospital encounter of 04/18/23  Resp panel by RT-PCR (RSV, Flu A&B, Covid) Anterior Nasal Swab     Status: None   Collection Time: 04/18/23 10:25 PM   Specimen: Anterior Nasal Swab  Result Value Ref Range Status   SARS Coronavirus 2 by RT PCR NEGATIVE NEGATIVE Final    Comment: (NOTE) SARS-CoV-2 target nucleic acids are NOT DETECTED.  The SARS-CoV-2 RNA is generally detectable in upper respiratory specimens during the acute phase of infection. The lowest concentration of SARS-CoV-2 viral copies this assay can detect is 138 copies/mL. A negative result does not preclude SARS-Cov-2 infection and should not be used as the sole basis for treatment or other patient management decisions. A negative result may occur with  improper specimen collection/handling, submission of specimen other than nasopharyngeal swab, presence of viral mutation(s) within the areas targeted by this assay, and inadequate number of viral copies(<138 copies/mL). A negative result must be combined with clinical observations, patient history, and epidemiological information. The expected result is Negative.  Fact Sheet for Patients:  BloggerCourse.com  Fact Sheet for Healthcare Providers:  SeriousBroker.it  This test is no t yet approved or cleared by the [place] FDA and  has been authorized for detection and/or diagnosis of SARS-CoV-2 by FDA under an Emergency Use Authorization (EUA). This EUA will remain  in effect (meaning this test can be used) for the duration of the COVID-19 declaration under Section 564(b)(1) of the Act, 21 U.S.C.section 360bbb-3(b)(1), unless the authorization is terminated  or revoked sooner.       Influenza A by PCR NEGATIVE NEGATIVE Final   Influenza B by PCR NEGATIVE NEGATIVE Final     Comment: (NOTE) The Xpert Xpress SARS-CoV-2/FLU/RSV plus assay is intended as an aid in the diagnosis of influenza from Nasopharyngeal swab specimens and should not be used as a sole basis for treatment. Nasal washings and aspirates are unacceptable for Xpert Xpress SARS-CoV-2/FLU/RSV testing.  Fact Sheet for Patients: BloggerCourse.com  Fact Sheet for Healthcare Providers: SeriousBroker.it  This test is not yet approved or cleared by the [place] FDA and has been authorized for detection and/or diagnosis of SARS-CoV-2 by FDA under an Emergency Use Authorization (EUA). This EUA will remain in effect (meaning this test can be used) for the duration of the COVID-19 declaration under Section 564(b)(1) of the Act, 21 U.S.C. section 360bbb-3(b)(1), unless the authorization is terminated or revoked.     Resp Syncytial Virus by PCR NEGATIVE NEGATIVE Final    Comment: (NOTE) Fact Sheet for Patients: BloggerCourse.com  Fact Sheet for Healthcare Providers: SeriousBroker.it  This test is not yet approved or cleared by the [place] FDA and has been authorized for detection and/or diagnosis of SARS-CoV-2 by FDA under an Emergency Use Authorization (EUA). This EUA will remain in effect (meaning this test can be used) for the duration of the COVID-19 declaration under Section 564(b)(1) of the Act, 21 U.S.C. section 360bbb-3(b)(1), unless the authorization is terminated or revoked.  Performed at [facility], [address] [place], [place] [address]   Blood Culture (routine x 2)     Status: None (Preliminary result)   Collection Time: 04/18/23 11:35 PM   Specimen: BLOOD  Result Value Ref Range Status   Specimen Description BLOOD BLOOD RIGHT FOREARM  Final   Special Requests   Final    BOTTLES DRAWN AEROBIC AND ANAEROBIC Blood  Culture results may not be optimal due  to an excessive volume of blood received in culture bottles   Culture   Final    NO GROWTH < 12 HOURS Performed at [facility], [address] [place], [place] [address]    Report Status PENDING  Incomplete  Blood Culture (routine x 2)     Status: None (Preliminary result)   Collection Time: 04/19/23  1:16 AM   Specimen: BLOOD  Result Value Ref Range Status   Specimen Description BLOOD BLOOD LEFT ARM  Final   Special Requests   Final    BOTTLES DRAWN AEROBIC AND ANAEROBIC Blood Culture adequate volume   Culture  Setup Time   Final    GRAM POSITIVE COCCI AEROBIC BOTTLE ONLY Organism ID to follow CRITICAL RESULT CALLED TO, READ BACK BY AND VERIFIED WITH: Performed at [facility], [address], [place], [place] [address]    Culture GRAM POSITIVE COCCI  Final   Report Status PENDING  Incomplete  Blood Culture ID Panel (Reflexed)     Status: Abnormal   Collection Time: 04/19/23  1:16 AM  Result Value Ref Range Status   Enterococcus faecalis NOT DETECTED NOT DETECTED Final   Enterococcus Faecium NOT DETECTED NOT DETECTED Final   Listeria monocytogenes NOT DETECTED NOT DETECTED Final   Staphylococcus species DETECTED (A) NOT DETECTED Final    Comment: CRITICAL RESULT CALLED TO, READ BACK BY AND VERIFIED WITH: Tilden Fossa RN ED @ [phone] 04/20/23 [facility]FD    Staphylococcus aureus (BCID) NOT DETECTED NOT DETECTED Final   Staphylococcus epidermidis DETECTED (A) NOT DETECTED Final    Comment: Methicillin (oxacillin) resistant coagulase negative staphylococcus. Possible blood culture contaminant (unless isolated from more than one blood culture draw or clinical case suggests pathogenicity). No antibiotic treatment is indicated for blood  culture contaminants. Tilden Fossa RN ED @ [phone] 04/20/23 [facility]    Staphylococcus lugdunensis NOT DETECTED NOT DETECTED Final   Streptococcus species NOT DETECTED NOT DETECTED Final   Streptococcus agalactiae NOT DETECTED NOT DETECTED  Final   Streptococcus pneumoniae NOT DETECTED NOT DETECTED Final   Streptococcus pyogenes NOT DETECTED NOT DETECTED Final   A.calcoaceticus-baumannii NOT DETECTED NOT DETECTED Final   Bacteroides fragilis NOT DETECTED NOT DETECTED Final   Enterobacterales NOT DETECTED NOT DETECTED Final   Enterobacter cloacae complex NOT DETECTED NOT DETECTED Final   Escherichia coli NOT DETECTED NOT DETECTED Final   Klebsiella aerogenes NOT DETECTED NOT DETECTED Final   Klebsiella oxytoca NOT DETECTED NOT DETECTED Final   Klebsiella pneumoniae NOT DETECTED NOT DETECTED Final   Proteus species NOT DETECTED NOT DETECTED Final   Salmonella species NOT DETECTED NOT DETECTED Final   Serratia marcescens NOT DETECTED NOT DETECTED Final   Haemophilus influenzae NOT DETECTED NOT DETECTED Final   Neisseria meningitidis NOT DETECTED NOT DETECTED Final   Pseudomonas aeruginosa NOT DETECTED NOT DETECTED Final   Stenotrophomonas maltophilia NOT DETECTED NOT DETECTED Final   Candida albicans NOT DETECTED NOT DETECTED Final   Candida auris NOT DETECTED NOT DETECTED Final   Candida glabrata NOT DETECTED NOT DETECTED Final   Candida krusei NOT DETECTED NOT DETECTED Final   Candida parapsilosis NOT DETECTED NOT DETECTED Final   Candida tropicalis NOT DETECTED NOT DETECTED Final   Cryptococcus neoformans/gattii NOT DETECTED NOT DETECTED Final   Methicillin resistance mecA/C DETECTED (A) NOT DETECTED Final    Comment: Tilden Fossa RN ED @ [phone] 04/20/23 [facility]FD Performed at [facility], [address] [place], [place]  [address]   MRSA Next Gen by PCR, Nasal     Status: None   Collection Time: 04/19/23  8:13 AM   Specimen: Nasal Mucosa; Nasal Swab  Result Value Ref Range Status   MRSA by PCR Next Gen NOT DETECTED NOT DETECTED Final    Comment: (NOTE) The GeneXpert MRSA Assay (FDA approved for NASAL specimens only), is one component of a comprehensive MRSA colonization surveillance program. It is not  intended to diagnose MRSA infection nor to guide or monitor treatment for MRSA infections. Test performance is not FDA approved in patients less than 35 years old. Performed at [facility], [address] [place], [place] [address]   Respiratory (~20 pathogens) panel by PCR     Status: None   Collection Time: 04/19/23  9:42 AM   Specimen: Nasopharyngeal Swab; Respiratory  Result Value Ref Range Status   Adenovirus NOT DETECTED NOT DETECTED Final   Coronavirus 229E NOT DETECTED NOT DETECTED Final    Comment: (NOTE) The Coronavirus on the Respiratory Panel, DOES NOT test for the novel  Coronavirus (2019 nCoV)    Coronavirus HKU1 NOT DETECTED NOT DETECTED Final   Coronavirus NL63 NOT DETECTED NOT DETECTED Final   Coronavirus OC43 NOT DETECTED NOT DETECTED Final   Metapneumovirus NOT DETECTED NOT DETECTED Final   Rhinovirus / Enterovirus NOT DETECTED NOT DETECTED Final   Influenza A NOT DETECTED NOT DETECTED Final   Influenza B NOT DETECTED NOT DETECTED Final   Parainfluenza Virus 1 NOT DETECTED NOT DETECTED Final   Parainfluenza Virus 2 NOT DETECTED NOT DETECTED Final   Parainfluenza Virus 3 NOT DETECTED NOT DETECTED Final   Parainfluenza Virus 4 NOT DETECTED NOT DETECTED Final   Respiratory Syncytial Virus NOT DETECTED NOT DETECTED Final   Bordetella pertussis NOT DETECTED NOT DETECTED Final   Bordetella Parapertussis NOT DETECTED NOT DETECTED Final   Chlamydophila pneumoniae NOT DETECTED NOT DETECTED Final   Mycoplasma pneumoniae NOT DETECTED NOT DETECTED Final    Comment: Performed at [facility], [address] [place], [place] [address]    BCID Results: 1(aerobic) of 4 bottles with Staph Epi, mecA/C detected.  Pt currently on Azithromycin & Ceftriaxone for pneumonia.  CBC w/ diff WNL, pt afebrile.    Name of provider contacted: Cliffton Asters, NP   Changes to prescribed antibiotics required: Add Vanc d/t high risk pt with trach/vent  Otelia Sergeant,  PharmD, [facility] 04/20/2023 3:57 AM

## 2023-04-20 NOTE — ED Notes (Signed)
Pt bedlinen changed, new brief placed. Pt tolerated well. Call light within reach.

## 2023-04-20 NOTE — Progress Notes (Signed)
PROGRESS NOTE    Michelle [name]  VFI:[id number] DOB: May 24, 1961 DOA: 04/18/2023 PCP: Abram Sander, MD     Brief Narrative:  62 year old with history of asthma, COPD, CAD, HTN, HLD, chronic respiratory failure with tracheostomy presents to the ED with cough, congestion.  CT abdomen pelvis unremarkable.  Diagnosed with sepsis likely secondary to pneumonia.  Started on IV Rocephin and azithromycin.   Assessment & Plan:  Principal Problem:   Sepsis due to pneumonia [facility]) Active Problems:   Essential hypertension   Chronic obstructive pulmonary disease (COPD) (HCC)   Anxiety and depression   Type 2 diabetes mellitus without complications (HCC)   Abnormal CT scan   Sepsis due to pneumonia (HCC) Chronic respiratory failure with tracheostomy Sepsis physiology improving.  Adjust antibiotics as appropriate.Bronchodilators scheduled and as needed.  Procalcitonin and BNP negative.  Still feeling short of breath, will give 1 dose of Lasix IV, add Pulmicort COVID/flu/RSV/Resp Panel negative. Sputum Cultures sent Bcx= Stap Epi. Likely a contaminate.   Hypokalemia - As needed repletion.   Abnormal CT scan, dilated pancreatic duct Recommend outpatient MRI/MRCP. Possible she may have passed a stone. No longer having abd pain.  Normal LFTs   Type 2 diabetes mellitus without complications (HCC) Sliding scale and Accu-Chek.  Currently on Semglee.  Adjust as needed   Anxiety and depression Paxil and Klonopin.   Chronic obstructive pulmonary disease (COPD) (HCC) As needed nebs   Essential hypertension Norvasc, clonidine.  IV as needed   DVT prophylaxis: enoxaparin (LOVENOX) injection 40 mg Start: 04/19/23 1000 Code Status: DNR Family Communication:  daughter updated.  Status is: Inpatient Remains inpatient appropriate because: Cont hosp stay for abnormal BS    Subjective: Patient seen at bedside, tells me she is feeling more short of breath today.   Examination:  General  exam: Appears calm and comfortable, tracheostomy in place Respiratory system: Diminished breath sounds bilaterally Cardiovascular system: S1 & S2 heard, RRR. No JVD, murmurs, rubs, gallops or clicks. No pedal edema. Gastrointestinal system: Abdomen is nondistended, soft and nontender. No organomegaly or masses felt. Normal bowel sounds heard. Central nervous system: Alert and oriented. No focal neurological deficits. Extremities: Symmetric 5 x 5 power. Skin: No rashes, lesions or ulcers Psychiatry: Judgement and insight appear normal. Mood & affect appropriate. Trach in place           Diet Orders (From admission, onward)     Start     Ordered   04/19/23 0345  Diet Heart Room service appropriate? Yes; Fluid consistency: Thin  Diet effective now       Question Answer Comment  Room service appropriate? Yes   Fluid consistency: Thin      04/19/23 0346            Objective: Vitals:   04/20/23 0931 04/20/23 1002 04/20/23 1106 04/20/23 1325  BP:  124/68  (!) 143/84  Pulse:  85  (!) 112  Resp:  19  (!) 22  Temp: 99.1 F (37.3 C)     TempSrc: Oral     SpO2:  100% 100% 100%  Weight:      Height:       No intake or output data in the 24 hours ending 04/20/23 1351 Filed Weights   04/18/23 2201  Weight: 78.2 kg    Scheduled Meds:  ALPRAZolam  0.5 mg Oral Once   amLODipine  5 mg Oral Daily   aspirin  81 mg Oral Daily   budesonide (PULMICORT) nebulizer solution  0.25 mg Nebulization BID   [START ON 04/22/2023] cholecalciferol  1,000 Units Oral Daily   [START ON 04/22/2023] cloNIDine  0.1 mg Transdermal Weekly   dextromethorphan-guaiFENesin  1 tablet Oral BID   enoxaparin (LOVENOX) injection  40 mg Subcutaneous Q24H   insulin aspart  0-9 Units Subcutaneous TID WC   insulin glargine-yfgn  10 Units Subcutaneous QHS   ipratropium-albuterol  3 mL Nebulization Q6H   loratadine  10 mg Oral Daily   mirtazapine  30 mg Oral QHS   PARoxetine  40 mg Oral BH-q7a   Continuous  Infusions:  [START ON 04/21/2023] vancomycin      Nutritional status     Body mass index is 33.69 kg/m.  Data Reviewed:   CBC: Recent Labs  Lab 04/18/23 2225 04/19/23 0436 04/20/23 0601  WBC 9.4 6.5 5.0  NEUTROABS 6.1  --   --   HGB 11.8* 9.4* 9.4*  HCT 37.1 30.3* 30.8*  MCV 83.0 86.6 86.8  PLT 232 166 167   Basic Metabolic Panel: Recent Labs  Lab 04/18/23 2225 04/19/23 0436 04/20/23 0601  NA 137 136 141  K 3.4* 3.3* 3.6  CL 100 105 106  CO2 26 24 26   GLUCOSE 221* 286* 193*  BUN 8 7* 6*  CREATININE 0.61 0.68 0.72  CALCIUM 9.0 7.6* 8.2*  MG  --   --  1.8  PHOS  --   --  2.9   GFR: Estimated Creatinine Clearance: 68.3 mL/min (by C-G formula based on SCr of 0.72 mg/dL). Liver Function Tests: Recent Labs  Lab 04/18/23 2225  AST 16  ALT 20  ALKPHOS 80  BILITOT 0.8  PROT 7.7  ALBUMIN 3.6   Recent Labs  Lab 04/18/23 2225  LIPASE 35   No results for input(s): "AMMONIA" in the last 168 hours. Coagulation Profile: Recent Labs  Lab 04/19/23 0436  INR 1.1   Cardiac Enzymes: No results for input(s): "CKTOTAL", "CKMB", "CKMBINDEX", "TROPONINI" in the last 168 hours. BNP (last 3 results) No results for input(s): "PROBNP" in the last 8760 hours. HbA1C: No results for input(s): "HGBA1C" in the last 72 hours. CBG: Recent Labs  Lab 04/19/23 2152 04/20/23 1007  GLUCAP 288* 208*   Lipid Profile: No results for input(s): "CHOL", "HDL", "LDLCALC", "TRIG", "CHOLHDL", "LDLDIRECT" in the last 72 hours. Thyroid Function Tests: No results for input(s): "TSH", "T4TOTAL", "FREET4", "T3FREE", "THYROIDAB" in the last 72 hours. Anemia Panel: No results for input(s): "VITAMINB12", "FOLATE", "FERRITIN", "TIBC", "IRON", "RETICCTPCT" in the last 72 hours. Sepsis Labs: Recent Labs  Lab 04/18/23 2225 04/19/23 0436  PROCALCITON  --  <0.10  LATICACIDVEN 1.0  --     Recent Results (from the past 240 hour(s))  Resp panel by RT-PCR (RSV, Flu A&B, Covid) Anterior  Nasal Swab     Status: None   Collection Time: 04/18/23 10:25 PM   Specimen: Anterior Nasal Swab  Result Value Ref Range Status   SARS Coronavirus 2 by RT PCR NEGATIVE NEGATIVE Final    Comment: (NOTE) SARS-CoV-2 target nucleic acids are NOT DETECTED.  The SARS-CoV-2 RNA is generally detectable in upper respiratory specimens during the acute phase of infection. The lowest concentration of SARS-CoV-2 viral copies this assay can detect is 138 copies/mL. A negative result does not preclude SARS-Cov-2 infection and should not be used as the sole basis for treatment or other patient management decisions. A negative result may occur with  improper specimen collection/handling, submission of specimen other than nasopharyngeal swab, presence of viral mutation(s)  within the areas targeted by this assay, and inadequate number of viral copies(<138 copies/mL). A negative result must be combined with clinical observations, patient history, and epidemiological information. The expected result is Negative.  Fact Sheet for Patients:  BloggerCourse.com  Fact Sheet for Healthcare Providers:  SeriousBroker.it  This test is no t yet approved or cleared by the [place] FDA and  has been authorized for detection and/or diagnosis of SARS-CoV-2 by FDA under an Emergency Use Authorization (EUA). This EUA will remain  in effect (meaning this test can be used) for the duration of the COVID-19 declaration under Section 564(b)(1) of the Act, 21 U.S.C.section 360bbb-3(b)(1), unless the authorization is terminated  or revoked sooner.       Influenza A by PCR NEGATIVE NEGATIVE Final   Influenza B by PCR NEGATIVE NEGATIVE Final    Comment: (NOTE) The Xpert Xpress SARS-CoV-2/FLU/RSV plus assay is intended as an aid in the diagnosis of influenza from Nasopharyngeal swab specimens and should not be used as a sole basis for treatment. Nasal washings  and aspirates are unacceptable for Xpert Xpress SARS-CoV-2/FLU/RSV testing.  Fact Sheet for Patients: BloggerCourse.com  Fact Sheet for Healthcare Providers: SeriousBroker.it  This test is not yet approved or cleared by the [place] FDA and has been authorized for detection and/or diagnosis of SARS-CoV-2 by FDA under an Emergency Use Authorization (EUA). This EUA will remain in effect (meaning this test can be used) for the duration of the COVID-19 declaration under Section 564(b)(1) of the Act, 21 U.S.C. section 360bbb-3(b)(1), unless the authorization is terminated or revoked.     Resp Syncytial Virus by PCR NEGATIVE NEGATIVE Final    Comment: (NOTE) Fact Sheet for Patients: BloggerCourse.com  Fact Sheet for Healthcare Providers: SeriousBroker.it  This test is not yet approved or cleared by the [place] FDA and has been authorized for detection and/or diagnosis of SARS-CoV-2 by FDA under an Emergency Use Authorization (EUA). This EUA will remain in effect (meaning this test can be used) for the duration of the COVID-19 declaration under Section 564(b)(1) of the Act, 21 U.S.C. section 360bbb-3(b)(1), unless the authorization is terminated or revoked.  Performed at [facility], [address] [place], [place] [address]   Blood Culture (routine x 2)     Status: None (Preliminary result)   Collection Time: 04/18/23 11:35 PM   Specimen: BLOOD  Result Value Ref Range Status   Specimen Description BLOOD BLOOD RIGHT FOREARM  Final   Special Requests   Final    BOTTLES DRAWN AEROBIC AND ANAEROBIC Blood Culture results may not be optimal due to an excessive volume of blood received in culture bottles   Culture   Final    NO GROWTH 1 DAY Performed at [facility], [address], [place], [place] [address]    Report Status PENDING  Incomplete   Blood Culture (routine x 2)     Status: None (Preliminary result)   Collection Time: 04/19/23  1:16 AM   Specimen: BLOOD  Result Value Ref Range Status   Specimen Description BLOOD BLOOD LEFT ARM  Final   Special Requests   Final    BOTTLES DRAWN AEROBIC AND ANAEROBIC Blood Culture adequate volume   Culture  Setup Time   Final    GRAM POSITIVE COCCI AEROBIC BOTTLE ONLY Organism ID to follow CRITICAL RESULT CALLED TO, READ BACK BY AND VERIFIED WITH: Tilden Fossa RN ED @ [phone] 04/20/23 LFD Performed at [facility], [address]  [place], [place] [address]    Culture GRAM POSITIVE COCCI  Final   Report Status PENDING  Incomplete  Blood Culture ID Panel (Reflexed)     Status: Abnormal   Collection Time: 04/19/23  1:16 AM  Result Value Ref Range Status   Enterococcus faecalis NOT DETECTED NOT DETECTED Final   Enterococcus Faecium NOT DETECTED NOT DETECTED Final   Listeria monocytogenes NOT DETECTED NOT DETECTED Final   Staphylococcus species DETECTED (A) NOT DETECTED Final    Comment: CRITICAL RESULT CALLED TO, READ BACK BY AND VERIFIED WITH: Tilden Fossa RN ED @ [phone] 04/20/23 LFD    Staphylococcus aureus (BCID) NOT DETECTED NOT DETECTED Final   Staphylococcus epidermidis DETECTED (A) NOT DETECTED Final    Comment: Methicillin (oxacillin) resistant coagulase negative staphylococcus. Possible blood culture contaminant (unless isolated from more than one blood culture draw or clinical case suggests pathogenicity). No antibiotic treatment is indicated for blood  culture contaminants. Tilden Fossa RN ED @ [phone] 04/20/23 LFD    Staphylococcus lugdunensis NOT DETECTED NOT DETECTED Final   Streptococcus species NOT DETECTED NOT DETECTED Final   Streptococcus agalactiae NOT DETECTED NOT DETECTED Final   Streptococcus pneumoniae NOT DETECTED NOT DETECTED Final   Streptococcus pyogenes NOT DETECTED NOT DETECTED Final   A.calcoaceticus-baumannii NOT DETECTED NOT DETECTED  Final   Bacteroides fragilis NOT DETECTED NOT DETECTED Final   Enterobacterales NOT DETECTED NOT DETECTED Final   Enterobacter cloacae complex NOT DETECTED NOT DETECTED Final   Escherichia coli NOT DETECTED NOT DETECTED Final   Klebsiella aerogenes NOT DETECTED NOT DETECTED Final   Klebsiella oxytoca NOT DETECTED NOT DETECTED Final   Klebsiella pneumoniae NOT DETECTED NOT DETECTED Final   Proteus species NOT DETECTED NOT DETECTED Final   Salmonella species NOT DETECTED NOT DETECTED Final   Serratia marcescens NOT DETECTED NOT DETECTED Final   Haemophilus influenzae NOT DETECTED NOT DETECTED Final   Neisseria meningitidis NOT DETECTED NOT DETECTED Final   Pseudomonas aeruginosa NOT DETECTED NOT DETECTED Final   Stenotrophomonas maltophilia NOT DETECTED NOT DETECTED Final   Candida albicans NOT DETECTED NOT DETECTED Final   Candida auris NOT DETECTED NOT DETECTED Final   Candida glabrata NOT DETECTED NOT DETECTED Final   Candida krusei NOT DETECTED NOT DETECTED Final   Candida parapsilosis NOT DETECTED NOT DETECTED Final   Candida tropicalis NOT DETECTED NOT DETECTED Final   Cryptococcus neoformans/gattii NOT DETECTED NOT DETECTED Final   Methicillin resistance mecA/C DETECTED (A) NOT DETECTED Final    CommentTilden Fossa RN ED @ [phone] 04/20/23 LFD Performed at [facility], [address] [place], [place] [address]   MRSA Next Gen by PCR, Nasal     Status: None   Collection Time: 04/19/23  8:13 AM   Specimen: Nasal Mucosa; Nasal Swab  Result Value Ref Range Status   MRSA by PCR Next Gen NOT DETECTED NOT DETECTED Final    Comment: (NOTE) The GeneXpert MRSA Assay (FDA approved for NASAL specimens only), is one component of a comprehensive MRSA colonization surveillance program. It is not intended to diagnose MRSA infection nor to guide or monitor treatment for MRSA infections. Test performance is not FDA approved in patients less than 40 years old. Performed at  [facility], [address] [place], [place] [address]   Respiratory (~20 pathogens) panel by PCR     Status: None   Collection Time: 04/19/23  9:42 AM   Specimen: Nasopharyngeal Swab; Respiratory  Result Value Ref Range Status   Adenovirus NOT  DETECTED NOT DETECTED Final   Coronavirus 229E NOT DETECTED NOT DETECTED Final    Comment: (NOTE) The Coronavirus on the Respiratory Panel, DOES NOT test for the novel  Coronavirus (2019 nCoV)    Coronavirus HKU1 NOT DETECTED NOT DETECTED Final   Coronavirus NL63 NOT DETECTED NOT DETECTED Final   Coronavirus OC43 NOT DETECTED NOT DETECTED Final   Metapneumovirus NOT DETECTED NOT DETECTED Final   Rhinovirus / Enterovirus NOT DETECTED NOT DETECTED Final   Influenza A NOT DETECTED NOT DETECTED Final   Influenza B NOT DETECTED NOT DETECTED Final   Parainfluenza Virus 1 NOT DETECTED NOT DETECTED Final   Parainfluenza Virus 2 NOT DETECTED NOT DETECTED Final   Parainfluenza Virus 3 NOT DETECTED NOT DETECTED Final   Parainfluenza Virus 4 NOT DETECTED NOT DETECTED Final   Respiratory Syncytial Virus NOT DETECTED NOT DETECTED Final   Bordetella pertussis NOT DETECTED NOT DETECTED Final   Bordetella Parapertussis NOT DETECTED NOT DETECTED Final   Chlamydophila pneumoniae NOT DETECTED NOT DETECTED Final   Mycoplasma pneumoniae NOT DETECTED NOT DETECTED Final    Comment: Performed at [facility], [address], [place], [place] [address]         Radiology Studies: DG Chest Port 1 View  Result Date: 04/20/2023 CLINICAL DATA:  Cough and congestion.  Asthma. EXAM: PORTABLE CHEST 1 VIEW COMPARISON:  One-view chest x-ray 04/18/2023 FINDINGS: The heart size is normal. Atherosclerotic calcifications are present at the aortic arch. Tracheostomy tube is stable. Increasing airspace opacity is present at the right base. Bilateral effusions are suspected, right greater than left. IMPRESSION: 1. Increasing airspace opacity at the right base  concerning for pneumonia. 2. Bilateral effusions, right greater than left. Electronically Signed   By: Marin Roberts M.D.   On: 04/20/2023 10:47   CT ABDOMEN PELVIS W CONTRAST  Result Date: 04/19/2023 CLINICAL DATA:  Right lower quadrant pain that worsens on palpation EXAM: CT ABDOMEN AND PELVIS WITH CONTRAST TECHNIQUE: Multidetector CT imaging of the abdomen and pelvis was performed using the standard protocol following bolus administration of intravenous contrast. RADIATION DOSE REDUCTION: This exam was performed according to the departmental dose-optimization program which includes automated exposure control, adjustment of the mA and/or kV according to patient size and/or use of iterative reconstruction technique. CONTRAST:  OMNIPAQUE IOHEXOL 300 MG/ML  SOLN COMPARISON:  CT abdomen and pelvis 10/15/2022 FINDINGS: Lower chest: Emphysema.  Bibasilar atelectasis. Hepatobiliary: Hepatic steatosis. Gallbladder and biliary tree are unremarkable. Pancreas: Slightly increased prominence of the pancreatic duct measuring 6 mm. No peripancreatic fluid or inflammation. Spleen: Unremarkable. Adrenals/Urinary Tract: Unremarkable adrenal glands. No urinary calculi or hydronephrosis. Nondistended bladder. Stomach/Bowel: Stomach is within normal limits. No bowel obstruction or bowel wall thickening. Normal appendix. Vascular/Lymphatic: Aortic atherosclerosis. No enlarged abdominal or pelvic lymph nodes. Reproductive: Uterus and adnexa are unremarkable. Tubal ligation clips. Other: No free intraperitoneal fluid or air. Musculoskeletal: No acute fracture. IMPRESSION: 1. No acute abnormality in the abdomen or pelvis. Normal appendix. 2. Slightly increased prominence of the pancreatic duct measuring 6 mm. Consider further evaluation with nonemergent MRI is MRCP. 3. Hepatic steatosis. Aortic Atherosclerosis (ICD10-I70.0) and Emphysema (ICD10-J43.9). Electronically Signed   By: Minerva Fester M.D.   On: 04/19/2023  01:07   DG Chest Port 1 View  Result Date: 04/18/2023 CLINICAL DATA:  Shortness of breath EXAM: PORTABLE CHEST 1 VIEW COMPARISON:  03/27/2023, 12/31/2022, CT 09/30/2021 FINDINGS: Tracheostomy tube tip about 8 cm superior to carina. Hyperinflation with emphysema. Increased right basilar opacity. Possible  right apical lung nodule. Normal cardiac size. Aortic atherosclerosis IMPRESSION: 1. Hyperinflation with emphysema. Increased right basilar opacity, possible atelectasis or pneumonia. 2. Possible right apical lung nodule. Recommend further evaluation with chest CT. Electronically Signed   By: Jasmine Pang M.D.   On: 04/18/2023 23:56           LOS: 1 day   Time spent= 35 mins    Miguel Rota, MD [facility]  If 7PM-7AM, please contact night-coverage  04/20/2023, 1:51 PM

## 2023-04-20 NOTE — ED Notes (Signed)
Positive blood cultures - Anerobic Staph Epi mecA resistant.

## 2023-04-20 NOTE — Consult Note (Signed)
Name: Michelle [name] MRN: [id number] DOB: 09-Mar-1961    LOS: 1  Referring Provider:  Dr. Stephania Fragmin Reason for Referral:  Acute on chronic respiratory failure  PULMONARY / CRITICAL CARE MEDICINE   Brief patient description: This is a 62 year old female with a history of chronic respiratory failure on home vent support and tracheostomy who presented to the ED with complaints of shortness of breath and was found to have right lower lobe pneumonia, sepsis due to pneumonia and bilateral pleural effusions.  PCCM was consulted for vent management during hospitalization.  HPI:  Michelle [name] is a 62 year old female with a past medical history as indicated below who presented to the appointment 04/18/2019 for with complaints of shortness of breath, right lower quadrant abdominal pain, and decreased urine output x 3 days.  Her ED workup revealed sepsis secondary to right lower lobe pneumonia and bilateral pleural effusions.  Her blood cultures were positive for Staphylococcus epidermidis.  Antibiotics were adjusted.  Patient is currently on azithromycin, ceftriaxone. Vancomycin discontinued.  Patient is currently on full vent support. Reports persistent dyspnea but not worse compared to when she was admitted.  Admission in the ED room 8 she had an incidental finding on CT of a slightly dilated pancreatic duct of approximately 6 mm.  She reports diffuse abdominal pain and constipation.  She denies chest pain, nausea, vomiting.  Patient feels that she is constipated and is requesting something for help with her bowels. Of note, patient reports that she has been eating while on vent support at home.   Past Medical History:  Diagnosis Date   Allergy    Anxiety    Asthma    CHF (congestive heart failure) (HCC)    Cocaine abuse (HCC)    COPD (chronic obstructive pulmonary disease) (HCC)    Coronary artery disease    Emphysema/COPD (HCC)    Hepatitis C    High cholesterol    Hypertension    Pneumothorax     Tobacco abuse    Past Surgical History:  Procedure Laterality Date   CARDIAC CATHETERIZATION     CHEST TUBE INSERTION     IR GASTROSTOMY TUBE MOD SED  10/31/2017   TRACHEOSTOMY TUBE PLACEMENT N/A 10/17/2017   Procedure: TRACHEOSTOMY;  Surgeon: Bud Face, MD;  Location: [facility] ORS;  Service: ENT;  Laterality: N/A;   @home  medications@ Allergies Allergies  Allergen Reactions   Amoxicillin     Patient has tolerated cephalosporins in the past   Ativan [Lorazepam]     Makes anxiety worse     Family History Family History  Problem Relation Age of Onset   Lung cancer Mother    Asthma Mother    CVA Father    CAD Father    Stroke Father    Breast cancer Maternal Aunt 72   COPD Sister    Social History  reports that she quit smoking about 5 years ago. Her smoking use included cigarettes. She started smoking about 46 years ago. She has a 4.1 pack-year smoking history. She has never used smokeless tobacco. She reports that she does not currently use drugs after having used the following drugs: "Crack" cocaine. She reports that she does not drink alcohol.  Review Of Systems: Limited as patient is on the vent.  Patient was able to whisper some words.  She agrees to consultation, shortness of breath, and abdominal pain she describes as diffuse.  All other systems are negative  VITAL SIGNS: BP (!) 106/93   Pulse Marland Kitchen)  112   Temp (!) 102.1 F (38.9 C) (Oral)   Resp (!) 25   Ht 5' (1.524 m)   Wt 72.3 kg   LMP 03/06/2005 (Approximate)   SpO2 100%   BMI 31.13 kg/m   HEMODYNAMICS:    VENTILATOR SETTINGS: Set Rate:  [16 bmp] 16 bmp Vt Set:  [400 mL] 400 mL PEEP:  [5 cmH20] 5 cmH20 Pressure Support:  [10 cmH20] 10 cmH20  INTAKE / OUTPUT: I/O last 3 completed shifts: In: 823 [I.V.:723; IV Piggyback:100] Out: -   PHYSICAL EXAMINATION: General: Acutely ill looking, in moderate respiratory distress. HEENT: Normocephalic and atraumatic, neck is short, tracheostomy in place,  PERRLA, oral mucosa moist. Neuro: Alert, following commands, moving all extremities, no focal deficits Cardiovascular: Apical pulse regular, S1-S2, no murmurs regurg or gallops Lungs: Breath sounds diminished in all lung fields, positive rhonchi in anterior lung fields, mild to moderate intercostal retractions Abdomen: Distended, hypoactive bowel sounds, mildly tympanic on percussion, palpation reveals diffuse tenderness and mild increase in liver span Musculoskeletal: Positive range of motion in upper and lower extremities, no joint deformities Skin: Warm and dry with no open skin lesions  LABS:  BMET Recent Labs  Lab 04/18/23 2225 04/19/23 0436 04/20/23 0601  NA 137 136 141  K 3.4* 3.3* 3.6  CL 100 105 106  CO2 26 24 26   BUN 8 7* 6*  CREATININE 0.61 0.68 0.72  GLUCOSE 221* 286* 193*    Electrolytes Recent Labs  Lab 04/18/23 2225 04/19/23 0436 04/20/23 0601  CALCIUM 9.0 7.6* 8.2*  MG  --   --  1.8  PHOS  --   --  2.9    CBC Recent Labs  Lab 04/18/23 2225 04/19/23 0436 04/20/23 0601  WBC 9.4 6.5 5.0  HGB 11.8* 9.4* 9.4*  HCT 37.1 30.3* 30.8*  PLT 232 166 167    Coag's Recent Labs  Lab 04/19/23 0436  INR 1.1    Sepsis Markers Recent Labs  Lab 04/18/23 2225 04/19/23 0436  LATICACIDVEN 1.0  --   PROCALCITON  --  <0.10    ABG No results for input(s): "PHART", "PCO2ART", "PO2ART" in the last 168 hours.  Liver Enzymes Recent Labs  Lab 04/18/23 2225  AST 16  ALT 20  ALKPHOS 80  BILITOT 0.8  ALBUMIN 3.6    Cardiac Enzymes No results for input(s): "TROPONINI", "PROBNP" in the last 168 hours.  Glucose Recent Labs  Lab 04/19/23 2152 04/20/23 1007 04/20/23 1432  GLUCAP 288* 208* 230*    Imaging DG Chest Port 1 View  Result Date: 04/20/2023 CLINICAL DATA:  Cough and congestion.  Asthma. EXAM: PORTABLE CHEST 1 VIEW COMPARISON:  One-view chest x-ray 04/18/2023 FINDINGS: The heart size is normal. Atherosclerotic calcifications are present  at the aortic arch. Tracheostomy tube is stable. Increasing airspace opacity is present at the right base. Bilateral effusions are suspected, right greater than left. IMPRESSION: 1. Increasing airspace opacity at the right base concerning for pneumonia. 2. Bilateral effusions, right greater than left. Electronically Signed   By: Marin Roberts M.D.   On: 04/20/2023 10:47     STUDIES:  None  CULTURES: Cultures>>> Blood cultures: Cultures positive for Staphylococcus epidermidis  ANTIBIOTICS: Vancomycin discontinued Ceftriaxone> Azithromycin>  SIGNIFICANT EVENTS: 04/18/2023: Admitted with sepsis secondary to pneumonia and acute on chronic respiratory failure due to pneumonia  LINES/TUBES: Peripheral IVs  DISCUSSION: 62 year old female with a history of chronic respiratory failure now presenting with acute on chronic respiratory failure secondary to right lower lobe  pneumonia, sepsis that is resolving and diffuse abdominal pain.  ASSESSMENT / PLAN:  PULMONARY A: Acute on chronic respiratory failure Right lower lobe pneumonia-questionable chronic recurrent aspiration pneumonitis Bilateral pleural effusions COPD P:   Continue vent support with current settings  chest x-ray as needed Aggressive pulmonary toileting Aspiration precautions-keep patient n.p.o. for now.  Patient's nutritional status will need to be addressed prior to discharge.  Patient has been eating while on full vent support and there is concern that she might be aspirating. Bronchodilators and inhaled steroids as ordered Titrate FiO2 to keep SpO2 93% and above. Continue expectorants as needed.  GASTROINTESTINAL A:   Diffuse abdominal pain and distention-symptoms likely due to constipation.  Patient reports no bowel movement for days now. Dilated pancreatic duct on CT P:   Abdominal x-ray unremarkable. Continue MiraLAX and Colace daily as needed for constipation Consider Dulcolax suppository or Fleet  enema for no bowel movement in more than 2 days. Follow-up incidental finding of dilated pancreatic duct on CT with outpatient gastroenterology . Rest of the treatment plan per hospitalist team  Best practice:  Diet: NPO Pain/Anxiety/Delirium protocol (if indicated): PRN XANAX VAP protocol (if indicated): Per protocol DVT prophylaxis: Lovenox GI prophylaxis: PPI Glucose control: Sensitive sliding scale since patient is n.p.o. Central venous access:  N/A Arterial line:  N/A Foley:  N/A Mobility:  bed rest  PT consulted: N/A Last date of multidisciplinary goals of care discussion: Code Status: DNR limited code Disposition: ICU under hospitalist care; PCCM consulted  Jamaiyah Pyle S. [facility] ANP-BC Pulmonary and Critical Care Medicine [facility] Pager [phone] or [phone]  NB: This document was prepared using Dragon voice recognition software and may include unintentional dictation errors.    04/20/2023, 2:59 PM

## 2023-04-20 NOTE — Plan of Care (Signed)

## 2023-04-20 NOTE — TOC Progression Note (Signed)
Transition of Care [facility]) - Initial/Assessment Note    Patient Details  Name: Michelle [name] MRN: [id number] Date of Birth: Jun 17, 1960  [facility] CM/SW Contact:    Colette Ribas, LCSWA Phone Number: 04/20/2023, 10:10 AM  Clinical Narrative:                  CSW spoke with [employer] she advised the patient's cane ma have gotten delivered to home instead of bedside but she will call me back and check.  10:14AM: Per Selena Batten patient signed for cane and received it 11/15.       Patient Goals and CMS Choice            Expected Discharge Plan and Services                                              Prior Living Arrangements/Services                       Activities of Daily Living   ADL Screening (condition at time of admission) Independently performs ADLs?: No Does the patient have a NEW difficulty with bathing/dressing/toileting/self-feeding that is expected to last >3 days?: No Does the patient have a NEW difficulty with getting in/out of bed, walking, or climbing stairs that is expected to last >3 days?: No Does the patient have a NEW difficulty with communication that is expected to last >3 days?: No Is the patient deaf or have difficulty hearing?: No Does the patient have difficulty seeing, even when wearing glasses/contacts?: No Does the patient have difficulty concentrating, remembering, or making decisions?: No  Permission Sought/Granted                  Emotional Assessment              Admission diagnosis:  Sepsis due to pneumonia (HCC) [J18.9, A41.9] Patient Active Problem List   Diagnosis Date Noted   Sepsis due to pneumonia (HCC) 04/19/2023   Essential hypertension 04/19/2023   Chronic obstructive pulmonary disease (COPD) (HCC) 04/19/2023   Anxiety and depression 04/19/2023   Type 2 diabetes mellitus without complications (HCC) 04/19/2023   Abnormal CT scan 04/19/2023   Obesity (BMI 30-39.9) 02/19/2023    Dysuria 02/18/2023   Elevated troponin 02/18/2023   COVID-19 virus infection 02/18/2023   Tracheostomy status (HCC) 04/02/2022   Acute on chronic respiratory failure (HCC) 09/30/2021   Urine retention 02/07/2021   Candidiasis of breast 11/15/2020   Atypical pneumonia 08/29/2019   HCAP (healthcare-associated pneumonia) 08/28/2019   Pseudomonas respiratory infection 07/03/2019   Depression with anxiety 07/03/2019   Hyperlipidemia associated with type 2 diabetes mellitus (HCC) 07/03/2019   PEG (percutaneous endoscopic gastrostomy) status (HCC) 07/03/2019   Chronic pain 07/03/2019   Hypokalemia 07/03/2019   DM (diabetes mellitus) (HCC) 06/24/2019   Chronic respiratory failure requiring continuous mechanical ventilation through tracheostomy (HCC) 10/20/2018   CAD (coronary artery disease) 10/20/2018   HTN (hypertension) 10/20/2018   Chronic diastolic CHF (congestive heart failure) (HCC) 10/20/2018   Pneumonia 09/17/2018   Panic disorder 10/29/2017   Altered mental status    Pressure injury of skin 10/23/2017   Acute on chronic respiratory failure with hypoxia and hypercapnia (HCC) 06/29/2017   Acute on chronic respiratory failure with hypoxia (HCC) 02/23/2017   Protein-calorie malnutrition, severe 05/23/2016   Sepsis (HCC)  05/22/2016   Cocaine abuse (HCC) 04/07/2015   Malnutrition of moderate degree (HCC) 10/25/2014   COPD (chronic obstructive pulmonary disease) (HCC) 10/24/2014   Tobacco abuse 10/24/2014   Community acquired pneumonia 10/24/2014   PCP:  Abram Sander, MD Pharmacy:   [facility] - [place], [place] - [address] [place] [address] Phone: [phone] Fax: [phone]  [facility] [address] [place] - [address] [place] [address] Phone: [phone] Fax: [phone]  [id number] - [place], [place] - [address] [place] [address] Phone: [phone] Fax: [phone]     Social  Determinants of Health (SDOH) Social History: SDOH Screenings   Food Insecurity: Patient Unable To Answer (04/19/2023)  Recent Concern: Food Insecurity - Food Insecurity Present (02/18/2023)  Housing: Patient Unable To Answer (04/19/2023)  Transportation Needs: Patient Unable To Answer (04/19/2023)  Utilities: Patient Unable To Answer (04/19/2023)  Financial Resource Strain: Medium Risk (06/29/2017)  Physical Activity: Inactive (06/29/2017)  Social Connections: Unknown (06/29/2017)  Stress: Stress Concern Present (06/29/2017)  Tobacco Use: Medium Risk (04/19/2023)   SDOH Interventions:     Readmission Risk Interventions    04/19/2023    2:10 PM 02/22/2023    1:47 PM 10/03/2021    3:58 PM  Readmission Risk Prevention Plan  Transportation Screening Complete Complete Complete  PCP or Specialist Appt within 3-5 Days Complete Complete Complete  HRI or Home Care Consult Complete Complete   Social Work Consult for Recovery Care Planning/Counseling Complete Complete Complete  Palliative Care Screening Complete Complete Not Applicable  Medication Review Oceanographer) Complete Complete Complete

## 2023-04-20 NOTE — Progress Notes (Signed)
Patient received from the ED, upon arrival patient placed on cardiac monitoring, vital signs obtained, RT at bedside setting up trach supplies and patient's home ventilator, patient febrile at 102.1 and tylenol given as ordered bringing temperature down to 99.7. Patient given CHG bath and linens changed. Patient reports constipation of which NP is placing orders.

## 2023-04-20 NOTE — Progress Notes (Signed)
Pharmacy Antibiotic Note  Michelle [name] is a 62 y.o. female w/ PMH of asthma, COPD, CAD, HTN, HLD, chronic respiratory failure with tracheostomy admitted on 04/18/2023 with PNA/ bacteremia.  Pharmacy has been consulted for Vancomycin dosing.  Plan: adjust vancomycin to 1000 mg IV Q 24 hrs. Goal AUC 400-550. Expected AUC: 529.5 SCr used: 0.80 mg/dL (rounded up)  Pharmacy will continue to follow and will adjust abx dosing whenever warranted.  Temp (24hrs), Avg:98.7 F (37.1 C), Min:98.2 F (36.8 C), Max:99.1 F (37.3 C)   Recent Labs  Lab 04/18/23 2225 04/19/23 0436 04/20/23 0601  WBC 9.4 6.5 5.0  CREATININE 0.61 0.68 0.72  LATICACIDVEN 1.0  --   --     Estimated Creatinine Clearance: 68.3 mL/min (by C-G formula based on SCr of 0.72 mg/dL).    Allergies  Allergen Reactions   Amoxicillin     Patient has tolerated cephalosporins in the past   Ativan [Lorazepam]     Makes anxiety worse     Antimicrobials this admission: 11/15 azithromycin >> 11/16 11/15 ceftriaxone >> 11/16 11/16 vancomycin >>   Microbiology results: 11/15 BCx: 1 (aerobic) of 4 bottles w/ Staphylococcus epidermidis, mecA/C+ 11/15 Sputum: Pending  11/15 MRSA negative  Thank you for allowing pharmacy to be a part of this patient's care.  Burnis Medin, PharmD, BCPS 04/20/2023 1:26 PM

## 2023-04-20 NOTE — ED Notes (Signed)
RT gave pt cool rag for head and fan. Pt resting and says anxiety is better. Does not wish to speak with daughter at this time. Refused breakfast tray.

## 2023-04-20 NOTE — Plan of Care (Signed)
Continuing with plan of care. 

## 2023-04-21 ENCOUNTER — Inpatient Hospital Stay: Payer: Medicaid Other

## 2023-04-21 DIAGNOSIS — J189 Pneumonia, unspecified organism: Secondary | ICD-10-CM | POA: Diagnosis not present

## 2023-04-21 DIAGNOSIS — A419 Sepsis, unspecified organism: Secondary | ICD-10-CM | POA: Diagnosis not present

## 2023-04-21 LAB — GLUCOSE, CAPILLARY
Glucose-Capillary: 176 mg/dL — ABNORMAL HIGH (ref 70–99)
Glucose-Capillary: 223 mg/dL — ABNORMAL HIGH (ref 70–99)
Glucose-Capillary: 202 mg/dL — ABNORMAL HIGH (ref 70–99)
Glucose-Capillary: 241 mg/dL — ABNORMAL HIGH (ref 70–99)

## 2023-04-21 LAB — URINALYSIS, W/ REFLEX TO CULTURE (INFECTION SUSPECTED)
Bacteria, UA: NONE SEEN
Bilirubin Urine: NEGATIVE
Glucose, UA: 150 mg/dL — AB
Hgb urine dipstick: NEGATIVE
Ketones, ur: NEGATIVE mg/dL
Nitrite: NEGATIVE
Protein, ur: 100 mg/dL — AB
Specific Gravity, Urine: 1.016 (ref 1.005–1.030)
WBC, UA: >50 WBC/hpf (ref 0–5)
pH: 6 (ref 5.0–8.0)

## 2023-04-21 LAB — BASIC METABOLIC PANEL
Anion gap: 10 (ref 5–15)
BUN: 6 mg/dL — ABNORMAL LOW (ref 8–23)
CO2: 27 mmol/L (ref 22–32)
Calcium: 8.1 mg/dL — ABNORMAL LOW (ref 8.9–10.3)
Chloride: 101 mmol/L (ref 98–111)
Creatinine, Ser: 0.65 mg/dL (ref 0.44–1.00)
GFR, Estimated: >60 mL/min (ref >60)
Glucose, Bld: 210 mg/dL — ABNORMAL HIGH (ref 70–99)
Potassium: 3.6 mmol/L (ref 3.5–5.1)
Sodium: 138 mmol/L (ref 135–145)

## 2023-04-21 LAB — CBC
HCT: 31.6 % — ABNORMAL LOW (ref 36.0–46.0)
Hemoglobin: 9.8 g/dL — ABNORMAL LOW (ref 12.0–15.0)
MCH: 26.5 pg (ref 26.0–34.0)
MCHC: 31 g/dL (ref 30.0–36.0)
MCV: 85.4 fL (ref 80.0–100.0)
Platelets: 179 10*3/uL (ref 150–400)
RBC: 3.7 MIL/uL — ABNORMAL LOW (ref 3.87–5.11)
RDW: 13.9 % (ref 11.5–15.5)
WBC: 5 10*3/uL (ref 4.0–10.5)
nRBC: 0 % (ref 0.0–0.2)

## 2023-04-21 LAB — MAGNESIUM: Magnesium: 1.7 mg/dL (ref 1.7–2.4)

## 2023-04-21 LAB — EXPECTORATED SPUTUM ASSESSMENT W GRAM STAIN, RFLX TO RESP C

## 2023-04-21 LAB — PROCALCITONIN: Procalcitonin: 0.74 ng/mL

## 2023-04-21 MED ORDER — MAGNESIUM SULFATE 2 GM/50ML IV SOLN
2.0000 g | Freq: Once | INTRAVENOUS | Status: AC
  Administered 2023-04-21: 2 g via INTRAVENOUS
  Filled 2023-04-21: qty 50

## 2023-04-21 NOTE — Progress Notes (Signed)
Offered bath to patient, patient states if she does not go home today she would like her bath to be given at 6pm, I asked patient if we could at the latest do 5pm and she was in agreement.

## 2023-04-21 NOTE — Plan of Care (Signed)

## 2023-04-21 NOTE — Plan of Care (Signed)
Continuing with plan of care. 

## 2023-04-21 NOTE — Progress Notes (Signed)
Ultrasound of the gal bladder performed by tech at bedside.

## 2023-04-21 NOTE — Progress Notes (Signed)
PHARMACY CONSULT NOTE - FOLLOW UP  Pharmacy Consult for Electrolyte Monitoring and Replacement   Recent Labs: Potassium (mmol/L)  Date Value  04/21/2023 3.6  07/11/2014 4.6   Magnesium (mg/dL)  Date Value  84/16/6063 1.7  10/31/2013 2.6 (H)   Calcium (mg/dL)  Date Value  01/60/1093 8.1 (L)   Calcium, Total (mg/dL)  Date Value  23/55/7322 8.9   Albumin (g/dL)  Date Value  02/54/2706 3.6  07/09/2014 3.7   Phosphorus (mg/dL)  Date Value  23/76/2831 2.9  10/31/2013 4.6   Sodium (mmol/L)  Date Value  04/21/2023 138  07/11/2014 139    Assessment: 62 y.o. female w/ PMH of asthma, COPD, CAD, HTN, HLD, chronic respiratory failure with tracheostomy admitted on 04/18/2023 with PNA   Goal of Therapy:  Electrolytes WNL  Plan:  ---2 grams IV magnesium sulfate x 1 ---recheck electrolytes in am  Lowella Bandy ,PharmD Clinical Pharmacist 04/21/2023 7:39 AM

## 2023-04-21 NOTE — Progress Notes (Signed)
Pt declined bed CPT at this time

## 2023-04-21 NOTE — Progress Notes (Signed)
Pt declined all CPT and trach care said she was tired and just wanted to sleep

## 2023-04-21 NOTE — Progress Notes (Signed)
PROGRESS NOTE    Michelle [name]  UJW:[id number] DOB: 28-Aug-1960 DOA: 04/18/2023 PCP: Abram Sander, MD     Brief Narrative:  62 year old with history of asthma, COPD, CAD, HTN, HLD, chronic respiratory failure with tracheostomy presents to the ED with cough, congestion.  CT abdomen pelvis unremarkable.  Diagnosed with sepsis likely secondary to pneumonia.  Started on IV Rocephin and azithromycin.  Respiratory workup was negative, seen by pulmonary team.  LFTs were overall normal.  Reporting of none specific abdominal pain, right upper quadrant ultrasound is ordered.   Assessment & Plan:  Principal Problem:   Sepsis due to pneumonia [facility]) Active Problems:   Essential hypertension   Chronic obstructive pulmonary disease (COPD) (HCC)   Anxiety and depression   Type 2 diabetes mellitus without complications (HCC)   Abnormal CT scan   Sepsis due to pneumonia (HCC) Chronic respiratory failure with tracheostomy Sepsis physiology improving.  Adjust antibiotics as appropriate, currently on azithromycin and Rocephin.Bronchodilators scheduled and as needed.  Procalcitonin and BNP negative.  Still feeling short of breath, will give 1 dose of Lasix IV, add Pulmicort COVID/flu/RSV/Resp Panel negative. Sputum Cultures sent Bcx= Stap Epi. Likely a contaminate.  Due to overnight fever, repeating blood cultures, UA and procalcitonin  Hypokalemia - As needed repletion.   Abnormal CT scan, dilated pancreatic duct Nonspecific abdominal discomfort Will obtain right upper quadrant ultrasound.  If negative, may require outpatient MRCP versus inpatient.  Wonder if she is already passed a stone.  LFTs are normal.    Type 2 diabetes mellitus without complications (HCC) Sliding scale and Accu-Chek.  Currently on Semglee.  Adjust as needed   Anxiety and depression Paxil and Klonopin.   Chronic obstructive pulmonary disease (COPD) (HCC) As needed nebs   Essential hypertension Norvasc, clonidine.   IV as needed   DVT prophylaxis: enoxaparin (LOVENOX) injection 40 mg Start: 04/19/23 1000 Code Status: DNR Family Communication:  daughter updated.  Status is: Inpatient Remains inpatient appropriate because: Cont hosp stay for abnormal BS    Subjective: Today she had fevers.  This morning reporting of some abdominal discomfort  Overall feels better.   Examination:  General exam: Appears calm and comfortable, tracheostomy in place Respiratory system: Diminished breath sounds bilaterally Cardiovascular system: S1 & S2 heard, RRR. No JVD, murmurs, rubs, gallops or clicks. No pedal edema. Gastrointestinal system: Abdomen is nondistended, soft and nontender. No organomegaly or masses felt. Normal bowel sounds heard. Central nervous system: Alert and oriented. No focal neurological deficits. Extremities: Symmetric 5 x 5 power. Skin: No rashes, lesions or ulcers Psychiatry: Judgement and insight appear normal. Mood & affect appropriate. Trach in place                Diet Orders (From admission, onward)     Start     Ordered   04/19/23 0345  Diet Heart Room service appropriate? Yes; Fluid consistency: Thin  Diet effective now       Question Answer Comment  Room service appropriate? Yes   Fluid consistency: Thin      04/19/23 0346            Objective: Vitals:   04/21/23 1000 04/21/23 1100 04/21/23 1200 04/21/23 1242  BP: 109/62 (!) 93/53 (!) 140/98   Pulse: 77 73 93   Resp: 16 16 (!) 26   Temp:      TempSrc:      SpO2: 99% 99% 97% 99%  Weight:      Height:  Intake/Output Summary (Last 24 hours) at 04/21/2023 1250 Last data filed at 04/21/2023 1200 Gross per 24 hour  Intake 277.97 ml  Output 1500 ml  Net -1222.03 ml   Filed Weights   04/18/23 2201 04/20/23 1432  Weight: 78.2 kg 72.3 kg    Scheduled Meds:  amLODipine  5 mg Oral Daily   aspirin  81 mg Oral Daily   budesonide (PULMICORT) nebulizer solution  0.25 mg Nebulization BID    Chlorhexidine Gluconate Cloth  6 each Topical Q0600   [START ON 04/22/2023] cholecalciferol  1,000 Units Oral Daily   [START ON 04/22/2023] cloNIDine  0.1 mg Transdermal Weekly   dextromethorphan-guaiFENesin  1 tablet Oral BID   enoxaparin (LOVENOX) injection  40 mg Subcutaneous Q24H   insulin aspart  0-9 Units Subcutaneous TID WC   insulin glargine-yfgn  10 Units Subcutaneous QHS   ipratropium-albuterol  3 mL Nebulization Q6H   loratadine  10 mg Oral Daily   mirtazapine  30 mg Oral QHS   PARoxetine  40 mg Oral BH-q7a   Continuous Infusions:  azithromycin Stopped (04/21/23 0010)   cefTRIAXone (ROCEPHIN)  IV Stopped (04/20/23 2113)    Nutritional status     Body mass index is 31.13 kg/m.  Data Reviewed:   CBC: Recent Labs  Lab 04/18/23 2225 04/19/23 0436 04/20/23 0601 04/21/23 0504  WBC 9.4 6.5 5.0 5.0  NEUTROABS 6.1  --   --   --   HGB 11.8* 9.4* 9.4* 9.8*  HCT 37.1 30.3* 30.8* 31.6*  MCV 83.0 86.6 86.8 85.4  PLT 232 166 167 179   Basic Metabolic Panel: Recent Labs  Lab 04/18/23 2225 04/19/23 0436 04/20/23 0601 04/21/23 0504  NA 137 136 141 138  K 3.4* 3.3* 3.6 3.6  CL 100 105 106 101  CO2 26 24 26 27   GLUCOSE 221* 286* 193* 210*  BUN 8 7* 6* 6*  CREATININE 0.61 0.68 0.72 0.65  CALCIUM 9.0 7.6* 8.2* 8.1*  MG  --   --  1.8 1.7  PHOS  --   --  2.9  --    GFR: Estimated Creatinine Clearance: 65.5 mL/min (by C-G formula based on SCr of 0.65 mg/dL). Liver Function Tests: Recent Labs  Lab 04/18/23 2225  AST 16  ALT 20  ALKPHOS 80  BILITOT 0.8  PROT 7.7  ALBUMIN 3.6   Recent Labs  Lab 04/18/23 2225  LIPASE 35   No results for input(s): "AMMONIA" in the last 168 hours. Coagulation Profile: Recent Labs  Lab 04/19/23 0436  INR 1.1   Cardiac Enzymes: No results for input(s): "CKTOTAL", "CKMB", "CKMBINDEX", "TROPONINI" in the last 168 hours. BNP (last 3 results) No results for input(s): "PROBNP" in the last 8760 hours. HbA1C: No results for  input(s): "HGBA1C" in the last 72 hours. CBG: Recent Labs  Lab 04/20/23 1432 04/20/23 1708 04/20/23 2105 04/21/23 0721 04/21/23 1144  GLUCAP 230* 205* 205* 176* 223*   Lipid Profile: No results for input(s): "CHOL", "HDL", "LDLCALC", "TRIG", "CHOLHDL", "LDLDIRECT" in the last 72 hours. Thyroid Function Tests: No results for input(s): "TSH", "T4TOTAL", "FREET4", "T3FREE", "THYROIDAB" in the last 72 hours. Anemia Panel: No results for input(s): "VITAMINB12", "FOLATE", "FERRITIN", "TIBC", "IRON", "RETICCTPCT" in the last 72 hours. Sepsis Labs: Recent Labs  Lab 04/18/23 2225 04/19/23 0436 04/20/23 0600 04/21/23 1005  PROCALCITON  --  <0.10 <0.10 0.74  LATICACIDVEN 1.0  --   --   --     Recent Results (from the past 240 hour(s))  Resp panel by RT-PCR (RSV, Flu A&B, Covid) Anterior Nasal Swab     Status: None   Collection Time: 04/18/23 10:25 PM   Specimen: Anterior Nasal Swab  Result Value Ref Range Status   SARS Coronavirus 2 by RT PCR NEGATIVE NEGATIVE Final    Comment: (NOTE) SARS-CoV-2 target nucleic acids are NOT DETECTED.  The SARS-CoV-2 RNA is generally detectable in upper respiratory specimens during the acute phase of infection. The lowest concentration of SARS-CoV-2 viral copies this assay can detect is 138 copies/mL. A negative result does not preclude SARS-Cov-2 infection and should not be used as the sole basis for treatment or other patient management decisions. A negative result may occur with  improper specimen collection/handling, submission of specimen other than nasopharyngeal swab, presence of viral mutation(s) within the areas targeted by this assay, and inadequate number of viral copies(<138 copies/mL). A negative result must be combined with clinical observations, patient history, and epidemiological information. The expected result is Negative.  Fact Sheet for Patients:  BloggerCourse.com  Fact Sheet for Healthcare  Providers:  SeriousBroker.it  This test is no t yet approved or cleared by the [place] FDA and  has been authorized for detection and/or diagnosis of SARS-CoV-2 by FDA under an Emergency Use Authorization (EUA). This EUA will remain  in effect (meaning this test can be used) for the duration of the COVID-19 declaration under Section 564(b)(1) of the Act, 21 U.S.C.section 360bbb-3(b)(1), unless the authorization is terminated  or revoked sooner.       Influenza A by PCR NEGATIVE NEGATIVE Final   Influenza B by PCR NEGATIVE NEGATIVE Final    Comment: (NOTE) The Xpert Xpress SARS-CoV-2/FLU/RSV plus assay is intended as an aid in the diagnosis of influenza from Nasopharyngeal swab specimens and should not be used as a sole basis for treatment. Nasal washings and aspirates are unacceptable for Xpert Xpress SARS-CoV-2/FLU/RSV testing.  Fact Sheet for Patients: BloggerCourse.com  Fact Sheet for Healthcare Providers: SeriousBroker.it  This test is not yet approved or cleared by the [place] FDA and has been authorized for detection and/or diagnosis of SARS-CoV-2 by FDA under an Emergency Use Authorization (EUA). This EUA will remain in effect (meaning this test can be used) for the duration of the COVID-19 declaration under Section 564(b)(1) of the Act, 21 U.S.C. section 360bbb-3(b)(1), unless the authorization is terminated or revoked.     Resp Syncytial Virus by PCR NEGATIVE NEGATIVE Final    Comment: (NOTE) Fact Sheet for Patients: BloggerCourse.com  Fact Sheet for Healthcare Providers: SeriousBroker.it  This test is not yet approved or cleared by the [place] FDA and has been authorized for detection and/or diagnosis of SARS-CoV-2 by FDA under an Emergency Use Authorization (EUA). This EUA will remain in effect (meaning this test can be  used) for the duration of the COVID-19 declaration under Section 564(b)(1) of the Act, 21 U.S.C. section 360bbb-3(b)(1), unless the authorization is terminated or revoked.  Performed at [facility], [address] [place], [place] [address]   Blood Culture (routine x 2)     Status: None (Preliminary result)   Collection Time: 04/18/23 11:35 PM   Specimen: BLOOD  Result Value Ref Range Status   Specimen Description BLOOD BLOOD RIGHT FOREARM  Final   Special Requests   Final    BOTTLES DRAWN AEROBIC AND ANAEROBIC Blood Culture results may not be optimal due to an excessive volume of blood received in culture bottles   Culture   Final  NO GROWTH 2 DAYS Performed at [facility], [address] [place], [place] [address]    Report Status PENDING  Incomplete  Blood Culture (routine x 2)     Status: Abnormal (Preliminary result)   Collection Time: 04/19/23  1:16 AM   Specimen: BLOOD  Result Value Ref Range Status   Specimen Description   Final    BLOOD BLOOD LEFT ARM Performed at [facility], [address], [place], [place] [address]    Special Requests   Final    BOTTLES DRAWN AEROBIC AND ANAEROBIC Blood Culture adequate volume Performed at [facility], [address], [place], [place] [address]    Culture  Setup Time   Final    GRAM POSITIVE COCCI AEROBIC BOTTLE ONLY CRITICAL RESULT CALLED TO, READ BACK BY AND VERIFIED WITH: Tilden Fossa RN ED @ [phone] 04/20/23 LFD    Culture (A)  Final    STAPHYLOCOCCUS EPIDERMIDIS THE SIGNIFICANCE OF ISOLATING THIS ORGANISM FROM A SINGLE SET OF BLOOD CULTURES WHEN MULTIPLE SETS ARE DRAWN IS UNCERTAIN. PLEASE NOTIFY THE MICROBIOLOGY DEPARTMENT WITHIN ONE WEEK IF SPECIATION AND SENSITIVITIES ARE REQUIRED. Performed at [facility], [address], [place], [place] [address]    Report Status PENDING  Incomplete  Blood Culture ID Panel (Reflexed)     Status: Abnormal   Collection Time:  04/19/23  1:16 AM  Result Value Ref Range Status   Enterococcus faecalis NOT DETECTED NOT DETECTED Final   Enterococcus Faecium NOT DETECTED NOT DETECTED Final   Listeria monocytogenes NOT DETECTED NOT DETECTED Final   Staphylococcus species DETECTED (A) NOT DETECTED Final    Comment: CRITICAL RESULT CALLED TO, READ BACK BY AND VERIFIED WITH: Tilden Fossa RN ED @ [phone] 04/20/23 LFD    Staphylococcus aureus (BCID) NOT DETECTED NOT DETECTED Final   Staphylococcus epidermidis DETECTED (A) NOT DETECTED Final    Comment: Methicillin (oxacillin) resistant coagulase negative staphylococcus. Possible blood culture contaminant (unless isolated from more than one blood culture draw or clinical case suggests pathogenicity). No antibiotic treatment is indicated for blood  culture contaminants. Tilden Fossa RN ED @ [phone] 04/20/23 LFD    Staphylococcus lugdunensis NOT DETECTED NOT DETECTED Final   Streptococcus species NOT DETECTED NOT DETECTED Final   Streptococcus agalactiae NOT DETECTED NOT DETECTED Final   Streptococcus pneumoniae NOT DETECTED NOT DETECTED Final   Streptococcus pyogenes NOT DETECTED NOT DETECTED Final   A.calcoaceticus-baumannii NOT DETECTED NOT DETECTED Final   Bacteroides fragilis NOT DETECTED NOT DETECTED Final   Enterobacterales NOT DETECTED NOT DETECTED Final   Enterobacter cloacae complex NOT DETECTED NOT DETECTED Final   Escherichia coli NOT DETECTED NOT DETECTED Final   Klebsiella aerogenes NOT DETECTED NOT DETECTED Final   Klebsiella oxytoca NOT DETECTED NOT DETECTED Final   Klebsiella pneumoniae NOT DETECTED NOT DETECTED Final   Proteus species NOT DETECTED NOT DETECTED Final   Salmonella species NOT DETECTED NOT DETECTED Final   Serratia marcescens NOT DETECTED NOT DETECTED Final   Haemophilus influenzae NOT DETECTED NOT DETECTED Final   Neisseria meningitidis NOT DETECTED NOT DETECTED Final   Pseudomonas aeruginosa NOT DETECTED NOT DETECTED Final    Stenotrophomonas maltophilia NOT DETECTED NOT DETECTED Final   Candida albicans NOT DETECTED NOT DETECTED Final   Candida auris NOT DETECTED NOT DETECTED Final   Candida glabrata NOT DETECTED NOT DETECTED Final   Candida krusei NOT DETECTED NOT DETECTED Final   Candida parapsilosis NOT DETECTED NOT DETECTED Final   Candida tropicalis NOT  DETECTED NOT DETECTED Final   Cryptococcus neoformans/gattii NOT DETECTED NOT DETECTED Final   Methicillin resistance mecA/C DETECTED (A) NOT DETECTED Final    Comment: Tilden Fossa RN ED @ [phone] 04/20/23 LFD Performed at [facility] [address] [place], [place] [address]   MRSA Next Gen by PCR, Nasal     Status: None   Collection Time: 04/19/23  8:13 AM   Specimen: Nasal Mucosa; Nasal Swab  Result Value Ref Range Status   MRSA by PCR Next Gen NOT DETECTED NOT DETECTED Final    Comment: (NOTE) The GeneXpert MRSA Assay (FDA approved for NASAL specimens only), is one component of a comprehensive MRSA colonization surveillance program. It is not intended to diagnose MRSA infection nor to guide or monitor treatment for MRSA infections. Test performance is not FDA approved in patients less than 74 years old. Performed at [facility], [address] [place], [place] [address]   Respiratory (~20 pathogens) panel by PCR     Status: None   Collection Time: 04/19/23  9:42 AM   Specimen: Nasopharyngeal Swab; Respiratory  Result Value Ref Range Status   Adenovirus NOT DETECTED NOT DETECTED Final   Coronavirus 229E NOT DETECTED NOT DETECTED Final    Comment: (NOTE) The Coronavirus on the Respiratory Panel, DOES NOT test for the novel  Coronavirus (2019 nCoV)    Coronavirus HKU1 NOT DETECTED NOT DETECTED Final   Coronavirus NL63 NOT DETECTED NOT DETECTED Final   Coronavirus OC43 NOT DETECTED NOT DETECTED Final   Metapneumovirus NOT DETECTED NOT DETECTED Final   Rhinovirus / Enterovirus NOT DETECTED NOT DETECTED Final    Influenza A NOT DETECTED NOT DETECTED Final   Influenza B NOT DETECTED NOT DETECTED Final   Parainfluenza Virus 1 NOT DETECTED NOT DETECTED Final   Parainfluenza Virus 2 NOT DETECTED NOT DETECTED Final   Parainfluenza Virus 3 NOT DETECTED NOT DETECTED Final   Parainfluenza Virus 4 NOT DETECTED NOT DETECTED Final   Respiratory Syncytial Virus NOT DETECTED NOT DETECTED Final   Bordetella pertussis NOT DETECTED NOT DETECTED Final   Bordetella Parapertussis NOT DETECTED NOT DETECTED Final   Chlamydophila pneumoniae NOT DETECTED NOT DETECTED Final   Mycoplasma pneumoniae NOT DETECTED NOT DETECTED Final    Comment: Performed at [facility], [address] [place], [place] [address]         Radiology Studies: DG Abd 1 View  Result Date: 04/20/2023 CLINICAL DATA:  Abdominal distension. EXAM: ABDOMEN - 1 VIEW COMPARISON:  CT of the abdomen pelvis 04/19/2023 FINDINGS: The bowel gas pattern is normal. No radio-opaque calculi or other significant radiographic abnormality are seen. IMPRESSION: Negative. Electronically Signed   By: Marin Roberts M.D.   On: 04/20/2023 16:08   DG Chest Port 1 View  Result Date: 04/20/2023 CLINICAL DATA:  Cough and congestion.  Asthma. EXAM: PORTABLE CHEST 1 VIEW COMPARISON:  One-view chest x-ray 04/18/2023 FINDINGS: The heart size is normal. Atherosclerotic calcifications are present at the aortic arch. Tracheostomy tube is stable. Increasing airspace opacity is present at the right base. Bilateral effusions are suspected, right greater than left. IMPRESSION: 1. Increasing airspace opacity at the right base concerning for pneumonia. 2. Bilateral effusions, right greater than left. Electronically Signed   By: Marin Roberts M.D.   On: 04/20/2023 10:47           LOS: 2 days   Time spent= 35 mins    Miguel Rota, MD [facility]  If 7PM-7AM, please  contact night-coverage  04/21/2023, 12:50 PM

## 2023-04-22 DIAGNOSIS — J189 Pneumonia, unspecified organism: Secondary | ICD-10-CM | POA: Diagnosis not present

## 2023-04-22 DIAGNOSIS — A419 Sepsis, unspecified organism: Secondary | ICD-10-CM | POA: Diagnosis not present

## 2023-04-22 LAB — HEPATIC FUNCTION PANEL
ALT: 15 U/L (ref 0–44)
AST: 18 U/L (ref 15–41)
Albumin: 2.9 g/dL — ABNORMAL LOW (ref 3.5–5.0)
Alkaline Phosphatase: 59 U/L (ref 38–126)
Bilirubin, Direct: <0.1 mg/dL (ref 0.0–0.2)
Total Bilirubin: <0.2 mg/dL (ref <1.2)
Total Protein: 6.1 g/dL — ABNORMAL LOW (ref 6.5–8.1)

## 2023-04-22 LAB — CULTURE, BLOOD (ROUTINE X 2): Special Requests: ADEQUATE

## 2023-04-22 LAB — CBC
HCT: 28.9 % — ABNORMAL LOW (ref 36.0–46.0)
Hemoglobin: 9 g/dL — ABNORMAL LOW (ref 12.0–15.0)
MCH: 26.6 pg (ref 26.0–34.0)
MCHC: 31.1 g/dL (ref 30.0–36.0)
MCV: 85.5 fL (ref 80.0–100.0)
Platelets: 204 10*3/uL (ref 150–400)
RBC: 3.38 MIL/uL — ABNORMAL LOW (ref 3.87–5.11)
RDW: 14 % (ref 11.5–15.5)
WBC: 4.4 10*3/uL (ref 4.0–10.5)
nRBC: 0 % (ref 0.0–0.2)

## 2023-04-22 LAB — BASIC METABOLIC PANEL
Anion gap: 10 (ref 5–15)
BUN: 10 mg/dL (ref 8–23)
CO2: 27 mmol/L (ref 22–32)
Calcium: 8.2 mg/dL — ABNORMAL LOW (ref 8.9–10.3)
Chloride: 105 mmol/L (ref 98–111)
Creatinine, Ser: 0.69 mg/dL (ref 0.44–1.00)
GFR, Estimated: >60 mL/min (ref >60)
Glucose, Bld: 288 mg/dL — ABNORMAL HIGH (ref 70–99)
Potassium: 3.2 mmol/L — ABNORMAL LOW (ref 3.5–5.1)
Sodium: 142 mmol/L (ref 135–145)

## 2023-04-22 LAB — URINE CULTURE: Culture: NO GROWTH

## 2023-04-22 LAB — PHOSPHORUS: Phosphorus: 2.1 mg/dL — ABNORMAL LOW (ref 2.5–4.6)

## 2023-04-22 LAB — MAGNESIUM: Magnesium: 2.1 mg/dL (ref 1.7–2.4)

## 2023-04-22 LAB — GLUCOSE, CAPILLARY
Glucose-Capillary: 194 mg/dL — ABNORMAL HIGH (ref 70–99)
Glucose-Capillary: 199 mg/dL — ABNORMAL HIGH (ref 70–99)
Glucose-Capillary: 294 mg/dL — ABNORMAL HIGH (ref 70–99)
Glucose-Capillary: 285 mg/dL — ABNORMAL HIGH (ref 70–99)

## 2023-04-22 MED ORDER — AZITHROMYCIN 250 MG PO TABS
250.0000 mg | ORAL_TABLET | Freq: Every day | ORAL | Status: AC
  Administered 2023-04-22: 250 mg via ORAL
  Filled 2023-04-22: qty 1

## 2023-04-22 MED ORDER — HYDROCOD POLI-CHLORPHE POLI ER 10-8 MG/5ML PO SUER: 5.0000 mL | Freq: Two times a day (BID) | ORAL | Status: DC | PRN

## 2023-04-22 MED ORDER — ADULT MULTIVITAMIN W/MINERALS CH
1.0000 | ORAL_TABLET | Freq: Every day | ORAL | Status: DC
  Administered 2023-04-23 – 2023-04-24 (×2): 1 via ORAL
  Filled 2023-04-22 (×2): qty 1

## 2023-04-22 MED ORDER — AMLODIPINE BESYLATE 5 MG PO TABS
5.0000 mg | ORAL_TABLET | Freq: Every day | ORAL | Status: DC
  Administered 2023-04-23 – 2023-04-24 (×2): 5 mg via ORAL
  Filled 2023-04-22 (×2): qty 1

## 2023-04-22 MED ORDER — ENSURE MAX PROTEIN PO LIQD
11.0000 [oz_av] | Freq: Two times a day (BID) | ORAL | Status: DC
  Administered 2023-04-23: 11 [oz_av] via ORAL
  Filled 2023-04-22: qty 330

## 2023-04-22 MED ORDER — POTASSIUM CHLORIDE CRYS ER 20 MEQ PO TBCR
40.0000 meq | EXTENDED_RELEASE_TABLET | Freq: Once | ORAL | Status: AC
  Administered 2023-04-22: 40 meq via ORAL
  Filled 2023-04-22: qty 2

## 2023-04-22 MED ORDER — CLONAZEPAM 0.5 MG PO TABS
0.5000 mg | ORAL_TABLET | Freq: Three times a day (TID) | ORAL | Status: DC
  Administered 2023-04-22 – 2023-04-24 (×7): 0.5 mg via ORAL
  Filled 2023-04-22 (×7): qty 1

## 2023-04-22 MED ORDER — GUAIFENESIN ER 600 MG PO TB12
600.0000 mg | ORAL_TABLET | Freq: Two times a day (BID) | ORAL | Status: DC
  Administered 2023-04-22 – 2023-04-24 (×5): 600 mg via ORAL
  Filled 2023-04-22 (×5): qty 1

## 2023-04-22 MED ORDER — POTASSIUM PHOSPHATES 15 MMOLE/5ML IV SOLN
30.0000 mmol | Freq: Once | INTRAVENOUS | Status: AC
  Administered 2023-04-22: 30 mmol via INTRAVENOUS
  Filled 2023-04-22: qty 10

## 2023-04-22 NOTE — Plan of Care (Signed)

## 2023-04-22 NOTE — Progress Notes (Addendum)
[facility] Progress Note  Patient: Michelle [name]    AOZ:[id number]  DOA: 04/18/2023     Date of Service: the patient was seen and examined on 04/22/2023  Chief Complaint  Patient presents with   Abdominal Pain   Brief hospital course:  62 year old with history of asthma, COPD, CAD, HTN, HLD, chronic respiratory failure with tracheostomy presents to the ED with cough, congestion.  CT abdomen pelvis unremarkable.  Diagnosed with sepsis likely secondary to pneumonia.  Started on IV Rocephin and azithromycin.  Respiratory workup was negative, seen by pulmonary team.  LFTs were overall normal.  Reporting of none specific abdominal pain, right upper quadrant ultrasound is ordered.     Assessment and Plan:  Principal Problem:   Sepsis due to pneumonia [facility]) Active Problems:   Essential hypertension   Chronic obstructive pulmonary disease (COPD) (HCC)   Anxiety and depression   Type 2 diabetes mellitus without complications (HCC)   Abnormal CT scan   Sepsis due to pneumonia Chronic respiratory failure with tracheostomy Sepsis physiology improving.  Adjust antibiotics as appropriate, currently on azithromycin and Rocephin.Bronchodilators scheduled and as needed.  Procalcitonin and BNP negative.  Still feeling short of breath, will give 1 dose of Lasix IV, add Pulmicort COVID/flu/RSV/Resp Panel negative. Sputum Cultures sent Bcx= Stap Epi. Likely a contaminate.  Due to overnight fever, repeating blood cultures, UA and procalcitonin   Hypokalemia, potassium repleted Hypophosphatemia, Phos repleted. Monitor electrolytes and replete as needed.    Abnormal CT scan, dilated pancreatic duct Nonspecific abdominal discomfort Will obtain right upper quadrant ultrasound.  If negative, may require outpatient MRCP versus inpatient.  Wonder if she is already passed a stone.  LFTs are normal.     Type 2 diabetes mellitus without complications  Sliding scale and Accu-Chek.  Currently on  Semglee.  Adjust as needed   Anxiety and depression Paxil and Klonopin.   Chronic obstructive pulmonary disease (COPD) As needed nebs   Essential hypertension Norvasc, clonidine.  IV as needed BP fluctuating, follow parameters for BP medications Monitor BP and titrate medications accordingly   Body mass index is 31.13 kg/m.  Interventions:  Diet: Heart healthy diet DVT Prophylaxis: Subcutaneous Lovenox   Advance goals of care discussion: DNR-Limited  Family Communication: family was not present at bedside, at the time of interview.  The pt provided permission to discuss medical plan with the family. Opportunity was given to ask question and all questions were answered satisfactorily.   Disposition:  Pt is from Home, admitted with sepsis PNA, still on IV Abx, which precludes a safe discharge. Discharge to Home, when stable, may need few days to improve.  Subjective: No significant overnight events, patient still has abdominal pain in the right side 3-4/10, feels cramps, no constant pain.  Denies any worsening of shortness of breath or cough.  No chest pain or palpitations, no any other active issues.  Physical Exam: General: NAD, lying comfortably Appear in no distress, affect appropriate Eyes: PERRLA ENT: Oral Mucosa Clear, moist, s/p Trach Neck: no JVD,  Cardiovascular: S1 and S2 Present, no Murmur,  Respiratory: Equal air entry bilaterally, s/p track, no significant wheezing appreciated. Abdomen: Bowel Sound present, Soft and no tenderness,  Skin: no rashes Extremities: no Pedal edema, no calf tenderness Neurologic: without any new focal findings Gait not checked due to patient safety concerns  Vitals:   04/22/23 1300 04/22/23 1400 04/22/23 1500 04/22/23 1546  BP: 129/79 (!) 177/101 (!) 99/58   Pulse: 84 87 78  Resp: (!) 26 19 (!) 21   Temp:      TempSrc:      SpO2: 98% 99% 99% 100%  Weight:      Height:        Intake/Output Summary (Last 24 hours) at  04/22/2023 1647 Last data filed at 04/22/2023 1500 Gross per 24 hour  Intake 100 ml  Output 950 ml  Net -850 ml   Filed Weights   04/18/23 2201 04/20/23 1432  Weight: 78.2 kg 72.3 kg    Data Reviewed: I have personally reviewed and interpreted daily labs, tele strips, imagings as discussed above. I reviewed all nursing notes, pharmacy notes, vitals, pertinent old records I have discussed plan of care as described above with RN and patient/family.  CBC: Recent Labs  Lab 04/18/23 2225 04/19/23 0436 04/20/23 0601 04/21/23 0504 04/22/23 0406  WBC 9.4 6.5 5.0 5.0 4.4  NEUTROABS 6.1  --   --   --   --   HGB 11.8* 9.4* 9.4* 9.8* 9.0*  HCT 37.1 30.3* 30.8* 31.6* 28.9*  MCV 83.0 86.6 86.8 85.4 85.5  PLT 232 166 167 179 204   Basic Metabolic Panel: Recent Labs  Lab 04/18/23 2225 04/19/23 0436 04/20/23 0601 04/21/23 0504 04/22/23 0406  NA 137 136 141 138 142  K 3.4* 3.3* 3.6 3.6 3.2*  CL 100 105 106 101 105  CO2 26 24 26 27 27   GLUCOSE 221* 286* 193* 210* 288*  BUN 8 7* 6* 6* 10  CREATININE 0.61 0.68 0.72 0.65 0.69  CALCIUM 9.0 7.6* 8.2* 8.1* 8.2*  MG  --   --  1.8 1.7 2.1  PHOS  --   --  2.9  --  2.1*    Studies: No results found.  Scheduled Meds:  [START ON 04/23/2023] amLODipine  5 mg Oral Daily   aspirin  81 mg Oral Daily   azithromycin  250 mg Oral QHS   budesonide (PULMICORT) nebulizer solution  0.25 mg Nebulization BID   Chlorhexidine Gluconate Cloth  6 each Topical Q0600   cholecalciferol  1,000 Units Oral Daily   clonazePAM  0.5 mg Oral TID   cloNIDine  0.1 mg Transdermal Weekly   enoxaparin (LOVENOX) injection  40 mg Subcutaneous Q24H   guaiFENesin  600 mg Oral BID   insulin aspart  0-9 Units Subcutaneous TID WC   insulin glargine-yfgn  10 Units Subcutaneous QHS   ipratropium-albuterol  3 mL Nebulization Q6H   loratadine  10 mg Oral Daily   mirtazapine  30 mg Oral QHS   [START ON 04/23/2023] multivitamin with minerals  1 tablet Oral Daily    PARoxetine  40 mg Oral BH-q7a   Ensure Max Protein  11 oz Oral BID   Continuous Infusions:  cefTRIAXone (ROCEPHIN)  IV Stopped (04/21/23 2059)   PRN Meds: acetaminophen **OR** acetaminophen, benzonatate, chlorpheniramine-HYDROcodone, docusate sodium, fluticasone, hydrALAZINE, hydrOXYzine, ipratropium-albuterol, metoprolol tartrate, ondansetron **OR** ondansetron (ZOFRAN) IV, oxyCODONE-acetaminophen, polyethylene glycol, traZODone  Time spent: 55 minutes  Author: Gillis Santa. MD [facility] 04/22/2023 4:47 PM  To reach On-call, see care teams to locate the attending and reach out to them via [email or web address]. If 7PM-7AM, please contact night-coverage If you still have difficulty reaching the attending provider, please page the [facility] (Director on Call) for [facility] on amion for assistance.

## 2023-04-22 NOTE — Progress Notes (Signed)
PHARMACIST - PHYSICIAN COMMUNICATION  CONCERNING: Antibiotic IV to Oral Route Change Policy  RECOMMENDATION: This patient is receiving azithromycin by the intravenous route.  Based on criteria approved by the Pharmacy and Therapeutics Committee, the antibiotic(s) is/are being converted to the equivalent oral dose form(s).  DESCRIPTION: These criteria include: Patient being treated for a respiratory tract infection, urinary tract infection, cellulitis or clostridium difficile associated diarrhea if on metronidazole The patient is not neutropenic and does not exhibit a GI malabsorption state The patient is eating (either orally or via tube) and/or has been taking other orally administered medications for a least 24 hours The patient is improving clinically and has a Tmax < 100.5  If you have questions about this conversion, please contact the Pharmacy Department   Tressie Ellis 04/22/23

## 2023-04-23 DIAGNOSIS — J189 Pneumonia, unspecified organism: Secondary | ICD-10-CM | POA: Diagnosis not present

## 2023-04-23 DIAGNOSIS — A419 Sepsis, unspecified organism: Secondary | ICD-10-CM | POA: Diagnosis not present

## 2023-04-23 LAB — BASIC METABOLIC PANEL
Anion gap: 11 (ref 5–15)
BUN: 6 mg/dL — ABNORMAL LOW (ref 8–23)
CO2: 25 mmol/L (ref 22–32)
Calcium: 8.5 mg/dL — ABNORMAL LOW (ref 8.9–10.3)
Chloride: 105 mmol/L (ref 98–111)
Creatinine, Ser: 0.6 mg/dL (ref 0.44–1.00)
GFR, Estimated: >60 mL/min (ref >60)
Glucose, Bld: 188 mg/dL — ABNORMAL HIGH (ref 70–99)
Potassium: 3.8 mmol/L (ref 3.5–5.1)
Sodium: 141 mmol/L (ref 135–145)

## 2023-04-23 LAB — CBC
HCT: 34.1 % — ABNORMAL LOW (ref 36.0–46.0)
Hemoglobin: 10.8 g/dL — ABNORMAL LOW (ref 12.0–15.0)
MCH: 26.4 pg (ref 26.0–34.0)
MCHC: 31.7 g/dL (ref 30.0–36.0)
MCV: 83.4 fL (ref 80.0–100.0)
Platelets: 237 10*3/uL (ref 150–400)
RBC: 4.09 MIL/uL (ref 3.87–5.11)
RDW: 13.9 % (ref 11.5–15.5)
WBC: 4.6 10*3/uL (ref 4.0–10.5)
nRBC: 0 % (ref 0.0–0.2)

## 2023-04-23 LAB — GLUCOSE, CAPILLARY
Glucose-Capillary: 173 mg/dL — ABNORMAL HIGH (ref 70–99)
Glucose-Capillary: 189 mg/dL — ABNORMAL HIGH (ref 70–99)
Glucose-Capillary: 207 mg/dL — ABNORMAL HIGH (ref 70–99)
Glucose-Capillary: 208 mg/dL — ABNORMAL HIGH (ref 70–99)

## 2023-04-23 LAB — MAGNESIUM: Magnesium: 1.9 mg/dL (ref 1.7–2.4)

## 2023-04-23 LAB — PHOSPHORUS: Phosphorus: 3.1 mg/dL (ref 2.5–4.6)

## 2023-04-23 MED ORDER — INSULIN ASPART 100 UNIT/ML IJ SOLN
0.0000 [IU] | Freq: Three times a day (TID) | INTRAMUSCULAR | Status: DC
  Administered 2023-04-23: 5 [IU] via SUBCUTANEOUS
  Administered 2023-04-24: 3 [IU] via SUBCUTANEOUS
  Administered 2023-04-24: 8 [IU] via SUBCUTANEOUS
  Filled 2023-04-23 (×3): qty 1

## 2023-04-23 NOTE — Progress Notes (Signed)
Progress Note   Patient: Michelle [name] ZOX:[id number] DOB: 1960/07/12 DOA: 04/18/2023     4 DOS: the patient was seen and examined on 04/23/2023    Brief hospital course:   62 year old with history of asthma, COPD, CAD, HTN, HLD, chronic respiratory failure with tracheostomy presents to the ED with cough, congestion.  CT abdomen pelvis unremarkable.  Diagnosed with sepsis likely secondary to pneumonia.  Started on IV Rocephin and azithromycin.  Respiratory workup was negative, seen by pulmonary team.  LFTs were overall normal.  Reporting of none specific abdominal pain, right upper quadrant ultrasound is ordered.     Assessment and Plan:  Principal Problem:   Sepsis due to pneumonia [facility]) Active Problems:   Essential hypertension   Chronic obstructive pulmonary disease (COPD) (HCC)   Anxiety and depression   Type 2 diabetes mellitus without complications (HCC)   Abnormal CT scan   Sepsis due to pneumonia Chronic respiratory failure with tracheostomy Sepsis physiology improving.  Adjust antibiotics as appropriate, currently on azithromycin and Rocephin.Bronchodilators scheduled and as needed.  Procalcitonin and BNP negative.  Still feeling short of breath, will give 1 dose of Lasix IV, add Pulmicort COVID/flu/RSV/Resp Panel negative. Sputum Cultures sent Bcx= Stap Epi. Likely a contaminate.  Follow-up on repeat blood culture results   Hypokalemia, potassium repleted Hypophosphatemia, Phos repleted. Continue repletion and monitoring     Abnormal CT scan, dilated pancreatic duct Nonspecific abdominal discomfort Will obtain right upper quadrant ultrasound.  If negative, may require outpatient MRCP versus inpatient.  Wonder if she is already passed a stone.  LFTs are normal.     Type 2 diabetes mellitus without complications  Sliding scale and Accu-Chek.  Currently on Semglee.  Adjust as needed   Anxiety and depression Continue Paxil and Klonopin.   Chronic obstructive  pulmonary disease (COPD) Continue as needed nebulization   Essential hypertension Norvasc, clonidine.  IV as needed BP fluctuating, follow parameters for BP medications Monitor BP and titrate medications accordingly     Body mass index is 31.13 kg/m.  Interventions:   Diet: Heart healthy diet DVT Prophylaxis: Subcutaneous Lovenox    Advance goals of care discussion: DNR-Limited   Family Communication: Discussed with patient's family over the phone   Disposition: Discharge to Home, when stable, may need few days to improve.   Subjective:  Patient still on tracheostomy via trach collar Denied worsening shortness of breath chest pain cough nausea vomiting   Physical Exam: General: NAD, lying comfortably Appear in no distress, affect appropriate Eyes: PERRLA ENT: Oral Mucosa Clear, moist, s/p Trach Neck: no JVD,  Cardiovascular: S1 and S2 Present, no Murmur,  Respiratory: Equal air entry bilaterally, s/p track, no significant wheezing appreciated. Abdomen: Bowel Sound present, Soft and no tenderness,  Skin: no rashes Extremities: no Pedal edema, no calf tenderness Neurologic: without any new focal findings Gait not checked due to patient safety concerns    Data reviewed:    Latest Ref Rng & Units 04/23/2023    5:43 AM 04/22/2023    4:06 AM 04/21/2023    5:04 AM  CBC  WBC 4.0 - 10.5 K/uL 4.6  4.4  5.0   Hemoglobin 12.0 - 15.0 g/dL 81.1  9.0  9.8   Hematocrit 36.0 - 46.0 % 34.1  28.9  31.6   Platelets 150 - 400 K/uL 237  204  179        Latest Ref Rng & Units 04/23/2023    5:43 AM 04/22/2023    4:06 AM  04/21/2023    5:04 AM  BMP  Glucose 70 - 99 mg/dL 161  096  045   BUN 8 - 23 mg/dL 6  10  6    Creatinine 0.44 - 1.00 mg/dL 4.09  8.11  9.14   Sodium 135 - 145 mmol/L 141  142  138   Potassium 3.5 - 5.1 mmol/L 3.8  3.2  3.6   Chloride 98 - 111 mmol/L 105  105  101   CO2 22 - 32 mmol/L 25  27  27    Calcium 8.9 - 10.3 mg/dL 8.5  8.2  8.1      Vitals:    04/23/23 0500 04/23/23 0600 04/23/23 0800 04/23/23 0900  BP: 98/61 106/66 123/77 (!) 114/51  Pulse: (!) 59 60 (!) 59 63  Resp: 16 16 16 16   Temp:      TempSrc:      SpO2: 100% 100% 100% 100%  Weight:      Height:         Author: Loyce Dys, MD 04/23/2023 2:14 PM  For on call review [email or web address].

## 2023-04-24 DIAGNOSIS — A419 Sepsis, unspecified organism: Secondary | ICD-10-CM | POA: Diagnosis not present

## 2023-04-24 DIAGNOSIS — J189 Pneumonia, unspecified organism: Secondary | ICD-10-CM | POA: Diagnosis not present

## 2023-04-24 LAB — BASIC METABOLIC PANEL
Anion gap: 6 (ref 5–15)
BUN: 9 mg/dL (ref 8–23)
CO2: 26 mmol/L (ref 22–32)
Calcium: 8.7 mg/dL — ABNORMAL LOW (ref 8.9–10.3)
Chloride: 104 mmol/L (ref 98–111)
Creatinine, Ser: 0.7 mg/dL (ref 0.44–1.00)
GFR, Estimated: >60 mL/min (ref >60)
Glucose, Bld: 243 mg/dL — ABNORMAL HIGH (ref 70–99)
Potassium: 4.2 mmol/L (ref 3.5–5.1)
Sodium: 136 mmol/L (ref 135–145)

## 2023-04-24 LAB — CBC
HCT: 31.6 % — ABNORMAL LOW (ref 36.0–46.0)
Hemoglobin: 9.9 g/dL — ABNORMAL LOW (ref 12.0–15.0)
MCH: 25.9 pg — ABNORMAL LOW (ref 26.0–34.0)
MCHC: 31.3 g/dL (ref 30.0–36.0)
MCV: 82.7 fL (ref 80.0–100.0)
Platelets: 269 10*3/uL (ref 150–400)
RBC: 3.82 MIL/uL — ABNORMAL LOW (ref 3.87–5.11)
RDW: 14 % (ref 11.5–15.5)
WBC: 6.5 10*3/uL (ref 4.0–10.5)
nRBC: 0 % (ref 0.0–0.2)

## 2023-04-24 LAB — PHOSPHORUS: Phosphorus: 4 mg/dL (ref 2.5–4.6)

## 2023-04-24 LAB — MAGNESIUM: Magnesium: 1.8 mg/dL (ref 1.7–2.4)

## 2023-04-24 LAB — CULTURE, BLOOD (ROUTINE X 2): Culture: NO GROWTH

## 2023-04-24 LAB — GLUCOSE, CAPILLARY
Glucose-Capillary: 192 mg/dL — ABNORMAL HIGH (ref 70–99)
Glucose-Capillary: 253 mg/dL — ABNORMAL HIGH (ref 70–99)

## 2023-04-24 MED ORDER — CHLORHEXIDINE GLUCONATE CLOTH 2 % EX PADS: 6.0000 | MEDICATED_PAD | Freq: Every day | CUTANEOUS | Status: DC

## 2023-04-24 MED ORDER — DOCUSATE SODIUM 100 MG PO CAPS: 200.0000 mg | ORAL_CAPSULE | Freq: Every day | ORAL | 0 refills | Status: AC | PRN

## 2023-04-24 MED ORDER — ACETAMINOPHEN 325 MG PO TABS: 650.0000 mg | ORAL_TABLET | Freq: Four times a day (QID) | ORAL | 0 refills | Status: AC | PRN

## 2023-04-24 NOTE — Discharge Summary (Signed)
Physician Discharge Summary   Patient: Michelle [name] MRN: [id number] DOB: 06-Jan-1961  Admit date:     04/18/2023  Discharge date: 04/24/23  Discharge Physician: Loyce Dys   PCP: Abram Sander, MD   Recommendations at discharge:  Follow-up with primary care physician  Discharge Diagnoses:  Sepsis due to pneumonia Chronic respiratory failure with tracheostomy Hypokalemia, potassium repleted Hypophosphatemia, Phos repleted. Abnormal CT scan, dilated pancreatic duct Nonspecific abdominal discomfort Type 2 diabetes mellitus without complications  Anxiety and depression Chronic obstructive pulmonary disease (COPD) Essential hypertension   Hospital Course:  61 year old with history of asthma, COPD, CAD, HTN, HLD, chronic respiratory failure with tracheostomy presents to the ED with cough, congestion. CT abdomen pelvis unremarkable. Diagnosed with sepsis likely secondary to pneumonia. Started on IV Rocephin and azithromycin. Respiratory workup was negative, seen by pulmonary team. LFTs were overall normal. Reporting of none specific abdominal pain, right upper quadrant ultrasound obtained which was unremarkable.  Patient is now cleared for discharge today as she has completed antibiotics and to follow-up as an outpatient         Consultants: None Procedures performed: None Disposition: Home health Diet recommendation:  Cardiac diet DISCHARGE MEDICATION: Allergies as of 04/24/2023       Reactions   Amoxicillin    Patient has tolerated cephalosporins in the past   Ativan [lorazepam]    Makes anxiety worse         Medication List     STOP taking these medications    predniSONE 10 MG tablet Commonly known as: DELTASONE       TAKE these medications    acetaminophen 325 MG tablet Commonly known as: TYLENOL Take 2 tablets (650 mg total) by mouth every 6 (six) hours as needed for mild pain (pain score 1-3) (or Fever >/= 101).   albuterol 108 (90 Base)  MCG/ACT inhaler Commonly known as: VENTOLIN HFA Inhale 2 puffs into the lungs every 6 (six) hours as needed for wheezing or shortness of breath.   albuterol (2.5 MG/3ML) 0.083% nebulizer solution Commonly known as: PROVENTIL Take 3 mLs (2.5 mg total) by nebulization every 6 (six) hours as needed for wheezing or shortness of breath.   amLODipine 5 MG tablet Commonly known as: NORVASC Take 5 mg by mouth daily.   aspirin 81 MG chewable tablet Chew 81 mg by mouth daily.   azelastine 0.1 % nasal spray Commonly known as: ASTELIN SMARTSIG:1-2 Spray(s) Both Nares Every 12 Hours PRN   benzonatate 100 MG capsule Commonly known as: Tessalon Perles Take 1-2 capsules (100-200 mg total) by mouth 3 (three) times daily as needed for cough.   cetirizine 10 MG tablet Commonly known as: ZYRTEC Take 10 mg by mouth daily.   clonazePAM 0.5 MG tablet Commonly known as: KLONOPIN Take 0.5 mg by mouth in the morning, at noon, and at bedtime.   cloNIDine 0.1 mg/24hr patch Commonly known as: CATAPRES - Dosed in mg/24 hr Place 0.1 mg onto the skin once a week.   [facility] Blood Glucose Meter w/Device Kit Use as directed to monitor blood glucose up to qid   D3-50 1.25 MG (50000 UT) capsule Generic drug: Cholecalciferol Take 50,000 Units by mouth once a week.   docusate sodium 100 MG capsule Commonly known as: COLACE Take 2 capsules (200 mg total) by mouth daily as needed for mild constipation.   fluticasone 50 MCG/ACT nasal spray Commonly known as: FLONASE Place 1 spray into both nostrils daily.   guaiFENesin 600 MG 12  hr tablet Commonly known as: MUCINEX Take 600 mg by mouth 2 (two) times daily.   hydrOXYzine 25 MG capsule Commonly known as: VISTARIL Take 25 mg by mouth every 6 (six) hours as needed.   ipratropium-albuterol 0.5-2.5 (3) MG/3ML Soln Commonly known as: DUONEB Take 3 mLs by nebulization every 6 (six) hours. What changed:  when to take this reasons to take this   Lantus  SoloStar 100 UNIT/ML Solostar Pen Generic drug: insulin glargine Inject 10 Units into the skin at bedtime.   mirtazapine 30 MG tablet Commonly known as: REMERON Take 30 mg by mouth at bedtime.   mupirocin ointment 2 % Commonly known as: BACTROBAN Apply 1 Application topically daily. To inside nose and to affected area of axilla   naloxone 4 MG/0.1ML Liqd nasal spray kit Commonly known as: NARCAN Place 1 spray into the nose once.   NovoLOG FlexPen 100 UNIT/ML FlexPen Generic drug: insulin aspart Inject into the skin 3 (three) times daily with meals. Sliding scale mealtime insulin.   nystatin ointment Commonly known as: MYCOSTATIN Apply 1 Application topically 3 (three) times daily.   oxyCODONE-acetaminophen 5-325 MG tablet Commonly known as: PERCOCET/ROXICET Take 1 tablet by mouth 3 (three) times daily as needed for pain.   PARoxetine 40 MG tablet Commonly known as: PAXIL Take 40 mg by mouth every morning. Give 30 mg x 1 week (1.5 tab of 20 mg) then start 40 mg daily. D/c 20 mg pills at that time.   polyethylene glycol powder 17 GM/SCOOP powder Commonly known as: GLYCOLAX/MIRALAX Take by mouth.   Pulmicort 0.5 MG/2ML nebulizer solution Generic drug: budesonide SMARTSIG:1 Via Nebulizer Twice Daily   terbinafine 1 % cream Commonly known as: LAMISIL Apply 1 application topically 2 (two) times daily. To affected skin, for 1 week after resolution of rash. # 30 gm, 1 refill.   Trulicity 4.5 MG/0.5ML Soaj Generic drug: Dulaglutide Inject 4.5 mg into the skin once a week.   Vitamin D (Ergocalciferol) 1.25 MG (50000 UNIT) Caps capsule Commonly known as: DRISDOL Take 50,000 Units by mouth 2 (two) times a week.               Durable Medical Equipment  (From admission, onward)           Start     Ordered   04/19/23 1531  For home use only DME Cane  Once        04/19/23 1530            Discharge Exam: Filed Weights   04/20/23 1432 04/23/23 0400  04/24/23 0500  Weight: 72.3 kg 73.7 kg 74.9 kg   Appear in no distress, affect appropriate Eyes: PERRLA ENT: Oral Mucosa Clear, moist, s/p Trach Neck: no JVD,  Cardiovascular: S1 and S2 Present, no Murmur,  Respiratory: Equal air entry bilaterally, s/p track, no significant wheezing appreciated. Abdomen: Bowel Sound present, Soft and no tenderness,  Skin: no rashes Extremities: no Pedal edema, no calf tenderness Neurologic: without any new focal findings  Condition at discharge: good  The results of significant diagnostics from this hospitalization (including imaging, microbiology, ancillary and laboratory) are listed below for reference.   Imaging Studies: US Abdomen Limited RUQ (LIVER/GB)  Result Date: 04/21/2023 CLINICAL DATA:  [id number] Abdominal pain [id number] EXAM: ULTRASOUND ABDOMEN LIMITED COMPARISON:  05/27/2016. FINDINGS: The liver demonstrates normal parenchymal echogenicity and homogeneous texture without focal hepatic parenchymal lesions or intrahepatic ductal dilatation. Hepatopetal portal vein flow. The gallbladder demonstrates no stones, wall thickening or pericholecystic fluid.  CBD measured 0.3cm. IMPRESSION: Unremarkable examination of the upper abdomen. Electronically Signed   By: Layla Maw M.D.   On: 04/21/2023 16:35   DG Abd 1 View  Result Date: 04/20/2023 CLINICAL DATA:  Abdominal distension. EXAM: ABDOMEN - 1 VIEW COMPARISON:  CT of the abdomen pelvis 04/19/2023 FINDINGS: The bowel gas pattern is normal. No radio-opaque calculi or other significant radiographic abnormality are seen. IMPRESSION: Negative. Electronically Signed   By: Marin Roberts M.D.   On: 04/20/2023 16:08   DG Chest Port 1 View  Result Date: 04/20/2023 CLINICAL DATA:  Cough and congestion.  Asthma. EXAM: PORTABLE CHEST 1 VIEW COMPARISON:  One-view chest x-ray 04/18/2023 FINDINGS: The heart size is normal. Atherosclerotic calcifications are present at the aortic arch. Tracheostomy tube  is stable. Increasing airspace opacity is present at the right base. Bilateral effusions are suspected, right greater than left. IMPRESSION: 1. Increasing airspace opacity at the right base concerning for pneumonia. 2. Bilateral effusions, right greater than left. Electronically Signed   By: Marin Roberts M.D.   On: 04/20/2023 10:47   CT ABDOMEN PELVIS W CONTRAST  Result Date: 04/19/2023 CLINICAL DATA:  Right lower quadrant pain that worsens on palpation EXAM: CT ABDOMEN AND PELVIS WITH CONTRAST TECHNIQUE: Multidetector CT imaging of the abdomen and pelvis was performed using the standard protocol following bolus administration of intravenous contrast. RADIATION DOSE REDUCTION: This exam was performed according to the departmental dose-optimization program which includes automated exposure control, adjustment of the mA and/or kV according to patient size and/or use of iterative reconstruction technique. CONTRAST:  OMNIPAQUE IOHEXOL 300 MG/ML  SOLN COMPARISON:  CT abdomen and pelvis 10/15/2022 FINDINGS: Lower chest: Emphysema.  Bibasilar atelectasis. Hepatobiliary: Hepatic steatosis. Gallbladder and biliary tree are unremarkable. Pancreas: Slightly increased prominence of the pancreatic duct measuring 6 mm. No peripancreatic fluid or inflammation. Spleen: Unremarkable. Adrenals/Urinary Tract: Unremarkable adrenal glands. No urinary calculi or hydronephrosis. Nondistended bladder. Stomach/Bowel: Stomach is within normal limits. No bowel obstruction or bowel wall thickening. Normal appendix. Vascular/Lymphatic: Aortic atherosclerosis. No enlarged abdominal or pelvic lymph nodes. Reproductive: Uterus and adnexa are unremarkable. Tubal ligation clips. Other: No free intraperitoneal fluid or air. Musculoskeletal: No acute fracture. IMPRESSION: 1. No acute abnormality in the abdomen or pelvis. Normal appendix. 2. Slightly increased prominence of the pancreatic duct measuring 6 mm. Consider further  evaluation with nonemergent MRI is MRCP. 3. Hepatic steatosis. Aortic Atherosclerosis (ICD10-I70.0) and Emphysema (ICD10-J43.9). Electronically Signed   By: Minerva Fester M.D.   On: 04/19/2023 01:07   DG Chest 2 View  Result Date: 04/18/2023 CLINICAL DATA:  Shortness of breath EXAM: CHEST - 2 VIEW COMPARISON:  02/18/2023, 12/31/2022 FINDINGS: Tracheostomy tube tip is about 7 cm superior to the carina. Emphysema. Residual streaky atelectasis or infiltrates at the bases. No pleural effusion. Normal cardiac size. No pneumothorax IMPRESSION: Emphysema. Residual streaky atelectasis or infiltrates at the bases. Electronically Signed   By: Jasmine Pang M.D.   On: 04/18/2023 23:58   DG Chest Port 1 View  Result Date: 04/18/2023 CLINICAL DATA:  Shortness of breath EXAM: PORTABLE CHEST 1 VIEW COMPARISON:  03/27/2023, 12/31/2022, CT 09/30/2021 FINDINGS: Tracheostomy tube tip about 8 cm superior to carina. Hyperinflation with emphysema. Increased right basilar opacity. Possible right apical lung nodule. Normal cardiac size. Aortic atherosclerosis IMPRESSION: 1. Hyperinflation with emphysema. Increased right basilar opacity, possible atelectasis or pneumonia. 2. Possible right apical lung nodule. Recommend further evaluation with chest CT. Electronically Signed   By: Jasmine Pang M.D.   On:  04/18/2023 23:56    Microbiology: Results for orders placed or performed during the hospital encounter of 04/18/23  Resp panel by RT-PCR (RSV, Flu A&B, Covid) Anterior Nasal Swab     Status: None   Collection Time: 04/18/23 10:25 PM   Specimen: Anterior Nasal Swab  Result Value Ref Range Status   SARS Coronavirus 2 by RT PCR NEGATIVE NEGATIVE Final    Comment: (NOTE) SARS-CoV-2 target nucleic acids are NOT DETECTED.  The SARS-CoV-2 RNA is generally detectable in upper respiratory specimens during the acute phase of infection. The lowest concentration of SARS-CoV-2 viral copies this assay can detect is 138  copies/mL. A negative result does not preclude SARS-Cov-2 infection and should not be used as the sole basis for treatment or other patient management decisions. A negative result may occur with  improper specimen collection/handling, submission of specimen other than nasopharyngeal swab, presence of viral mutation(s) within the areas targeted by this assay, and inadequate number of viral copies(<138 copies/mL). A negative result must be combined with clinical observations, patient history, and epidemiological information. The expected result is Negative.  Fact Sheet for Patients:  BloggerCourse.com  Fact Sheet for Healthcare Providers:  SeriousBroker.it  This test is no t yet approved or cleared by the [place] FDA and  has been authorized for detection and/or diagnosis of SARS-CoV-2 by FDA under an Emergency Use Authorization (EUA). This EUA will remain  in effect (meaning this test can be used) for the duration of the COVID-19 declaration under Section 564(b)(1) of the Act, 21 U.S.C.section 360bbb-3(b)(1), unless the authorization is terminated  or revoked sooner.       Influenza A by PCR NEGATIVE NEGATIVE Final   Influenza B by PCR NEGATIVE NEGATIVE Final    Comment: (NOTE) The Xpert Xpress SARS-CoV-2/FLU/RSV plus assay is intended as an aid in the diagnosis of influenza from Nasopharyngeal swab specimens and should not be used as a sole basis for treatment. Nasal washings and aspirates are unacceptable for Xpert Xpress SARS-CoV-2/FLU/RSV testing.  Fact Sheet for Patients: BloggerCourse.com  Fact Sheet for Healthcare Providers: SeriousBroker.it  This test is not yet approved or cleared by the [place] FDA and has been authorized for detection and/or diagnosis of SARS-CoV-2 by FDA under an Emergency Use Authorization (EUA). This EUA will remain in effect (meaning  this test can be used) for the duration of the COVID-19 declaration under Section 564(b)(1) of the Act, 21 U.S.C. section 360bbb-3(b)(1), unless the authorization is terminated or revoked.     Resp Syncytial Virus by PCR NEGATIVE NEGATIVE Final    Comment: (NOTE) Fact Sheet for Patients: BloggerCourse.com  Fact Sheet for Healthcare Providers: SeriousBroker.it  This test is not yet approved or cleared by the [place] FDA and has been authorized for detection and/or diagnosis of SARS-CoV-2 by FDA under an Emergency Use Authorization (EUA). This EUA will remain in effect (meaning this test can be used) for the duration of the COVID-19 declaration under Section 564(b)(1) of the Act, 21 U.S.C. section 360bbb-3(b)(1), unless the authorization is terminated or revoked.  Performed at [facility], [address] [place], [place] [address]   Blood Culture (routine x 2)     Status: None   Collection Time: 04/18/23 11:35 PM   Specimen: BLOOD  Result Value Ref Range Status   Specimen Description BLOOD BLOOD RIGHT FOREARM  Final   Special Requests   Final    BOTTLES DRAWN AEROBIC AND ANAEROBIC Blood Culture results may not be optimal due to  an excessive volume of blood received in culture bottles   Culture   Final    NO GROWTH 5 DAYS Performed at [facility], [address] [place] [place], [place] [address]    Report Status 04/24/2023 FINAL  Final  Blood Culture (routine x 2)     Status: Abnormal   Collection Time: 04/19/23  1:16 AM   Specimen: BLOOD  Result Value Ref Range Status   Specimen Description   Final    BLOOD BLOOD LEFT ARM Performed at [facility], [address], [place], [place] [address]    Special Requests   Final    BOTTLES DRAWN AEROBIC AND ANAEROBIC Blood Culture adequate volume Performed at [facility], [address] [place], [place] [address]    Culture  Setup Time    Final    GRAM POSITIVE COCCI AEROBIC BOTTLE ONLY CRITICAL RESULT CALLED TO, READ BACK BY AND VERIFIED WITH: Tilden Fossa RN ED @ [phone] 04/20/23 LFD    Culture (A)  Final    STAPHYLOCOCCUS EPIDERMIDIS THE SIGNIFICANCE OF ISOLATING THIS ORGANISM FROM A SINGLE SET OF BLOOD CULTURES WHEN MULTIPLE SETS ARE DRAWN IS UNCERTAIN. PLEASE NOTIFY THE MICROBIOLOGY DEPARTMENT WITHIN ONE WEEK IF SPECIATION AND SENSITIVITIES ARE REQUIRED. Performed at [facility], [address] [place], [place] [address]    Report Status 04/22/2023 FINAL  Final  Blood Culture ID Panel (Reflexed)     Status: Abnormal   Collection Time: 04/19/23  1:16 AM  Result Value Ref Range Status   Enterococcus faecalis NOT DETECTED NOT DETECTED Final   Enterococcus Faecium NOT DETECTED NOT DETECTED Final   Listeria monocytogenes NOT DETECTED NOT DETECTED Final   Staphylococcus species DETECTED (A) NOT DETECTED Final    Comment: CRITICAL RESULT CALLED TO, READ BACK BY AND VERIFIED WITH: Tilden Fossa RN ED @ [phone] 04/20/23 LFD    Staphylococcus aureus (BCID) NOT DETECTED NOT DETECTED Final   Staphylococcus epidermidis DETECTED (A) NOT DETECTED Final    Comment: Methicillin (oxacillin) resistant coagulase negative staphylococcus. Possible blood culture contaminant (unless isolated from more than one blood culture draw or clinical case suggests pathogenicity). No antibiotic treatment is indicated for blood  culture contaminants. Tilden Fossa RN ED @ [phone] 04/20/23 LFD    Staphylococcus lugdunensis NOT DETECTED NOT DETECTED Final   Streptococcus species NOT DETECTED NOT DETECTED Final   Streptococcus agalactiae NOT DETECTED NOT DETECTED Final   Streptococcus pneumoniae NOT DETECTED NOT DETECTED Final   Streptococcus pyogenes NOT DETECTED NOT DETECTED Final   A.calcoaceticus-baumannii NOT DETECTED NOT DETECTED Final   Bacteroides fragilis NOT DETECTED NOT DETECTED Final   Enterobacterales NOT DETECTED NOT DETECTED  Final   Enterobacter cloacae complex NOT DETECTED NOT DETECTED Final   Escherichia coli NOT DETECTED NOT DETECTED Final   Klebsiella aerogenes NOT DETECTED NOT DETECTED Final   Klebsiella oxytoca NOT DETECTED NOT DETECTED Final   Klebsiella pneumoniae NOT DETECTED NOT DETECTED Final   Proteus species NOT DETECTED NOT DETECTED Final   Salmonella species NOT DETECTED NOT DETECTED Final   Serratia marcescens NOT DETECTED NOT DETECTED Final   Haemophilus influenzae NOT DETECTED NOT DETECTED Final   Neisseria meningitidis NOT DETECTED NOT DETECTED Final   Pseudomonas aeruginosa NOT DETECTED NOT DETECTED Final   Stenotrophomonas maltophilia NOT DETECTED NOT DETECTED Final   Candida albicans NOT DETECTED NOT DETECTED Final   Candida auris NOT DETECTED NOT DETECTED Final   Candida glabrata NOT DETECTED NOT DETECTED Final   Candida krusei NOT  DETECTED NOT DETECTED Final   Candida parapsilosis NOT DETECTED NOT DETECTED Final   Candida tropicalis NOT DETECTED NOT DETECTED Final   Cryptococcus neoformans/gattii NOT DETECTED NOT DETECTED Final   Methicillin resistance mecA/C DETECTED (A) NOT DETECTED Final    Comment: Tilden Fossa RN ED @ [phone] 04/20/23 LFD Performed at [facility], [address] [place], [place] [address]   MRSA Next Gen by PCR, Nasal     Status: None   Collection Time: 04/19/23  8:13 AM   Specimen: Nasal Mucosa; Nasal Swab  Result Value Ref Range Status   MRSA by PCR Next Gen NOT DETECTED NOT DETECTED Final    Comment: (NOTE) The GeneXpert MRSA Assay (FDA approved for NASAL specimens only), is one component of a comprehensive MRSA colonization surveillance program. It is not intended to diagnose MRSA infection nor to guide or monitor treatment for MRSA infections. Test performance is not FDA approved in patients less than 98 years old. Performed at [facility], [address] [place], [place] [address]   Respiratory (~20 pathogens) panel by PCR      Status: None   Collection Time: 04/19/23  9:42 AM   Specimen: Nasopharyngeal Swab; Respiratory  Result Value Ref Range Status   Adenovirus NOT DETECTED NOT DETECTED Final   Coronavirus 229E NOT DETECTED NOT DETECTED Final    Comment: (NOTE) The Coronavirus on the Respiratory Panel, DOES NOT test for the novel  Coronavirus (2019 nCoV)    Coronavirus HKU1 NOT DETECTED NOT DETECTED Final   Coronavirus NL63 NOT DETECTED NOT DETECTED Final   Coronavirus OC43 NOT DETECTED NOT DETECTED Final   Metapneumovirus NOT DETECTED NOT DETECTED Final   Rhinovirus / Enterovirus NOT DETECTED NOT DETECTED Final   Influenza A NOT DETECTED NOT DETECTED Final   Influenza B NOT DETECTED NOT DETECTED Final   Parainfluenza Virus 1 NOT DETECTED NOT DETECTED Final   Parainfluenza Virus 2 NOT DETECTED NOT DETECTED Final   Parainfluenza Virus 3 NOT DETECTED NOT DETECTED Final   Parainfluenza Virus 4 NOT DETECTED NOT DETECTED Final   Respiratory Syncytial Virus NOT DETECTED NOT DETECTED Final   Bordetella pertussis NOT DETECTED NOT DETECTED Final   Bordetella Parapertussis NOT DETECTED NOT DETECTED Final   Chlamydophila pneumoniae NOT DETECTED NOT DETECTED Final   Mycoplasma pneumoniae NOT DETECTED NOT DETECTED Final    Comment: Performed at [facility], [address], [place], [place] [address]  Culture, blood (Routine X 2) w Reflex to ID Panel     Status: None (Preliminary result)   Collection Time: 04/21/23 10:05 AM   Specimen: BLOOD  Result Value Ref Range Status   Specimen Description BLOOD BLOOD LEFT HAND  Final   Special Requests   Final    BOTTLES DRAWN AEROBIC AND ANAEROBIC Blood Culture adequate volume   Culture   Final    NO GROWTH 3 DAYS Performed at [facility], [address] [place], [place] [address]    Report Status PENDING  Incomplete  Culture, blood (Routine X 2) w Reflex to ID Panel     Status: None (Preliminary result)   Collection Time: 04/21/23 10:14 AM    Specimen: BLOOD  Result Value Ref Range Status   Specimen Description BLOOD BLOOD RIGHT HAND  Final   Special Requests   Final    BOTTLES DRAWN AEROBIC ONLY Blood Culture adequate volume   Culture   Final    NO GROWTH 3 DAYS Performed at [facility],  [address], [place], [place] [address]    Report Status PENDING  Incomplete  Urine Culture     Status: None   Collection Time: 04/21/23 12:48 PM   Specimen: Urine, Random  Result Value Ref Range Status   Specimen Description   Final    URINE, RANDOM Performed at [facility], [address], [place], [place] [address]    Special Requests   Final    NONE Reflexed from [phone] Performed at [facility], [address], [place], [place] [address]    Culture   Final    NO GROWTH Performed at [facility], [address], [place], [place] [address]    Report Status 04/22/2023 FINAL  Final  Expectorated Sputum Assessment w Gram Stain, Rflx to Resp Cult     Status: None   Collection Time: 04/21/23  8:59 PM   Specimen: Expectorated Sputum  Result Value Ref Range Status   Specimen Description EXPECTORATED SPUTUM  Final   Special Requests NONE  Final   Sputum evaluation   Final    THIS SPECIMEN IS ACCEPTABLE FOR SPUTUM CULTURE Performed at [facility], [address] [place], [place] [address]    Report Status 04/21/2023 FINAL  Final  Culture, Respiratory w Gram Stain     Status: None (Preliminary result)   Collection Time: 04/21/23  8:59 PM  Result Value Ref Range Status   Specimen Description EXPECTORATED SPUTUM  Final   Special Requests NONE Reflexed from [id number]  Final   Gram Stain   Final    RARE WBC PRESENT, PREDOMINANTLY PMN MODERATE GRAM NEGATIVE RODS    Culture   Final    MODERATE PSEUDOMONAS AERUGINOSA FEW SERRATIA MARCESCENS SUSCEPTIBILITIES TO FOLLOW    Report Status PENDING  Incomplete   Organism ID, Bacteria PSEUDOMONAS AERUGINOSA  Final      Susceptibility    Pseudomonas aeruginosa - MIC*    CEFTAZIDIME 4 SENSITIVE Sensitive     CIPROFLOXACIN 2 RESISTANT Resistant     GENTAMICIN 8 INTERMEDIATE Intermediate     IMIPENEM 1 SENSITIVE Sensitive     PIP/TAZO Value in next row Sensitive ug/mL     <=4 SENSITIVEPerformed at [facility], [address] [place], [place] [address]    * MODERATE PSEUDOMONAS AERUGINOSA    Labs: CBC: Recent Labs  Lab 04/18/23 2225 04/19/23 0436 04/20/23 0601 04/21/23 0504 04/22/23 0406 04/23/23 0543 04/24/23 0320  WBC 9.4   < > 5.0 5.0 4.4 4.6 6.5  NEUTROABS 6.1  --   --   --   --   --   --   HGB 11.8*   < > 9.4* 9.8* 9.0* 10.8* 9.9*  HCT 37.1   < > 30.8* 31.6* 28.9* 34.1* 31.6*  MCV 83.0   < > 86.8 85.4 85.5 83.4 82.7  PLT 232   < > 167 179 204 237 269   < > = values in this interval not displayed.   Basic Metabolic Panel: Recent Labs  Lab 04/20/23 0601 04/21/23 0504 04/22/23 0406 04/23/23 0543 04/24/23 0320  NA 141 138 142 141 136  K 3.6 3.6 3.2* 3.8 4.2  CL 106 101 105 105 104  CO2 26 27 27 25 26   GLUCOSE 193* 210* 288* 188* 243*  BUN 6* 6* 10 6* 9  CREATININE 0.72 0.65 0.69 0.60 0.70  CALCIUM 8.2* 8.1* 8.2* 8.5* 8.7*  MG 1.8 1.7 2.1 1.9 1.8  PHOS 2.9  --  2.1* 3.1 4.0   Liver Function Tests: Recent Labs  Lab 04/18/23 2225 04/22/23 0406  AST 16 18  ALT 20 15  ALKPHOS 80 59  BILITOT 0.8 <0.2  PROT 7.7 6.1*  ALBUMIN 3.6 2.9*   CBG: Recent Labs  Lab 04/23/23 1144 04/23/23 1712 04/23/23 2138 04/24/23 0933 04/24/23 1110  GLUCAP 189* 207* 208* 192* 253*    Discharge time spent:  35 minutes.  Signed: Loyce Dys, MD [facility] 04/24/2023

## 2023-04-24 NOTE — Progress Notes (Signed)
After patient discharge, RN found patient's cane still in patient's room.   Patient label placed on cane and given to unit secretary.  RN called and LVM for patient's daughter Yorleni Hibdon to inform of cane being left on unit & request for patient's family to pick up cane at their earliest convenience.

## 2023-04-24 NOTE — TOC Transition Note (Addendum)
Transition of Care [facility]) - CM/SW Discharge Note   Patient Details  Name: Michelle [name] MRN: [id number] Date of Birth: 03-21-1961  [facility] CM/SW Contact:  Margarito Liner, LCSW Phone Number: 04/24/2023, 1:17 PM   Clinical Narrative:   Patient has orders to discharge home today. Patient confirmed she is still active with [facility] and [facility] home health agencies. CSW spoke to [place] with [facility] [phone]) and Belenda Cruise with [facility] [phone]) to notify them of plan to discharge today. CSW faxed discharge summary to both of them. Patient will need EMS transport home. Address on facesheet is correct. EMS set up for around 2:00 per daughter request. EMS is aware that patient's home NIV is in the room and needs to go with her. No further concerns. CSW signing off.  2:21 pm: Per RN, EMS will not transport with [facility] unless family rides with her that is familiar with the machine. RN and daughter confirmed she uses it 24/7. Daughter will be here in about 30 minutes to ride with her. EMS supervisor is aware and will work on getting a truck here around the same time.  Final next level of care: Home w Home Health Services Barriers to Discharge: Barriers Resolved   Patient Goals and CMS Choice      Discharge Placement                  Patient to be transferred to facility by: EMS Name of family member notified: Thedore Mins Patient and family notified of of transfer: 04/24/23  Discharge Plan and Services Additional resources added to the After Visit Summary for                                       Social Determinants of Health (SDOH) Interventions SDOH Screenings   Food Insecurity: Patient Unable To Answer (04/19/2023)  Recent Concern: Food Insecurity - Food Insecurity Present (02/18/2023)  Housing: Patient Unable To Answer (04/19/2023)  Transportation Needs: Patient Unable To Answer (04/19/2023)  Utilities: Patient Unable To Answer (04/19/2023)  Financial  Resource Strain: Medium Risk (06/29/2017)  Physical Activity: Inactive (06/29/2017)  Social Connections: Unknown (06/29/2017)  Stress: Stress Concern Present (06/29/2017)  Tobacco Use: Medium Risk (04/19/2023)     Readmission Risk Interventions    04/19/2023    2:10 PM 02/22/2023    1:47 PM 10/03/2021    3:58 PM  Readmission Risk Prevention Plan  Transportation Screening Complete Complete Complete  PCP or Specialist Appt within 3-5 Days Complete Complete Complete  HRI or Home Care Consult Complete Complete   Social Work Consult for Recovery Care Planning/Counseling Complete Complete Complete  Palliative Care Screening Complete Complete Not Applicable  Medication Review Oceanographer) Complete Complete Complete

## 2023-04-24 NOTE — Care Management Important Message (Signed)
Important Message  Patient Details  Name: ELYSSIA DIAB MRN: [id number] Date of Birth: [birth year]   Important Message Given:  Yes - Medicare IM  I reviewed the Important Message from Medicare and obtained verbal consent from the patient's daughter, Lailanee Arends by phone [phone] and thanked her for her time. I will forward to Health Information Management to be scanned into her medical record.    Olegario Messier A Ziyad Dyar 04/24/2023, 2:01 PM

## 2023-04-25 LAB — CULTURE, RESPIRATORY W GRAM STAIN

## 2023-04-26 LAB — CULTURE, BLOOD (ROUTINE X 2)
Culture: NO GROWTH
Culture: NO GROWTH
Special Requests: ADEQUATE
Special Requests: ADEQUATE

## 2023-04-28 LAB — GLUCOSE, CAPILLARY: Glucose-Capillary: 191 mg/dL — ABNORMAL HIGH (ref 70–99)

## 2023-06-17 ENCOUNTER — Ambulatory Visit: Payer: Medicaid Other | Admitting: Internal Medicine

## 2023-06-17 ENCOUNTER — Encounter: Payer: Self-pay | Admitting: Internal Medicine

## 2023-06-17 VITALS — BP 154/96 | HR 103 | Temp 97.6°F

## 2023-06-17 DIAGNOSIS — Z9911 Dependence on respirator [ventilator] status: Secondary | ICD-10-CM | POA: Diagnosis not present

## 2023-06-17 DIAGNOSIS — J449 Chronic obstructive pulmonary disease, unspecified: Secondary | ICD-10-CM | POA: Diagnosis not present

## 2023-06-17 DIAGNOSIS — J9612 Chronic respiratory failure with hypercapnia: Secondary | ICD-10-CM | POA: Diagnosis not present

## 2023-06-17 DIAGNOSIS — J9610 Chronic respiratory failure, unspecified whether with hypoxia or hypercapnia: Secondary | ICD-10-CM

## 2023-06-17 DIAGNOSIS — Z930 Tracheostomy status: Secondary | ICD-10-CM

## 2023-06-17 MED ORDER — PREDNISONE 20 MG PO TABS: 20.0000 mg | ORAL_TABLET | Freq: Every day | ORAL | 1 refills | Status: DC

## 2023-06-17 MED ORDER — IPRATROPIUM-ALBUTEROL 0.5-2.5 (3) MG/3ML IN SOLN: 3.0000 mL | Freq: Four times a day (QID) | RESPIRATORY_TRACT | 10 refills | Status: DC | PRN

## 2023-06-17 MED ORDER — AZITHROMYCIN 250 MG PO TABS: ORAL_TABLET | ORAL | 0 refills | Status: DC

## 2023-06-17 NOTE — Patient Instructions (Addendum)
 Continue nebulized therapy as prescribed  Continue ventilator therapy as prescribed  Continue oxygen as prescribed  Prednisone  20 mg daily for 7 days  Azithromycin  as prescribed for infection  Avoid Allergens and Irritants Avoid secondhand smoke Avoid SICK contacts Recommend  Masking  when appropriate Recommend Keep up-to-date with vaccinations

## 2023-06-17 NOTE — Progress Notes (Signed)
 Name: Michelle Michelle [name] MRN: [id number] DOB: 12-07-1960     CONSULTATION DATE: 06/17/2023  REFERRING MD : [facility]   Synopsis 63 year old Michelle [name], former smoker.  Last medical history significant for severe COPD with chronic tracheostomy, chronic respiratory failure, asthma, atypical pneumonia/Pseudomonas respiratory infection, history of pneumothorax, tobacco abuse, cocaine abuse, CHF, coronary artery disease, hepatitis C, diabetes mellitus type 2.  Patient of Dr. Isaiah, last seen in office December 08, 2018.  Hospital admissions for COPD exacerbation and multi focal pneumonia.  Michelle Michelle [name] readmitted in March for hospital-acquired pneumonia and sepsis. Recurrent bouts of pneumonia Recent admission aspiration pneumonia and COPD exacerbation Pseudomonas (Intermediate to fluoroquinolones) and mixed gram positive/gram negative organisms.  Hospital course 08/28/2019-09/03/2019: Admitted with fevers Treated for tracheitis vs healthcare associated pneumonia with aztreonam , Flagyl , vancomycin  She was placed on vent for full support and started on bronchodilators.  She had full bed physiotherapy and chest PT.       CHIEF COMPLAINT:  Follow-up assessment respiratory failure Follow-up assessment COPD Follow-up assessment tracheostomy vent dependent respiratory failure   HISTORY OF PRESENT ILLNESS: Michelle Michelle [name] with hx of COPD c/b chronic hypoxic respiratory failure s/p tracheostomy on home vent, CAD She has end-stage COPD chronic vent dependent status post trach Inpatient swallow study however showed no concern for aspiration at [facility]  Patient eating foods by mouth without difficulty    Acute on Chronic Hypoxic Respiratory Failure  COPD: History of severe COPD, previously followed by [facility] pulm. Required tracheostomy 10/2017 after prolonged intubation for COPD exacerbation complicated by pneumonia and difficult vent wean. After failing trach collar trials was discharged with home ventilatory support.    CAD:  Had MI in 2009, though prior records indicate in setting of cocaine use. Continued home ASA and statin.   Oxygen Yes Mode: ventilator   home vent settings  Vent Mode: PRVC FiO2 (%): 40 % S RR: 16 S VT: 450 mL PEEP: 5 cm H20    + exacerbation at this time No evidence of heart failure at this time + signs of infection at this time No respiratory distress No fevers, chills, nausea, vomiting, diarrhea No evidence of lower extremity edema No evidence hemoptysis  Thick mucoid plugs Low-grade fevers Increase shortness of breath Increased intermittent wheezing Nontoxic-appearing Plan for prednisone  and antibiotic therapy  PAST MEDICAL HISTORY :   has a past medical history of Allergy, Anxiety, Asthma, CHF (congestive heart failure) (HCC), Cocaine abuse (HCC), COPD (chronic obstructive pulmonary disease) (HCC), Coronary artery disease, Emphysema/COPD (HCC), Hepatitis C, High cholesterol, Hypertension, Pneumothorax, and Tobacco abuse.  has a past surgical history that includes Cardiac catheterization; Chest tube insertion; Tracheostomy tube placement (N/A, 10/17/2017); and IR GASTROSTOMY TUBE MOD SED (10/31/2017). Prior to Admission medications   Medication Sig Start Date End Date Taking? Authorizing Provider  AMBULATORY NON FORMULARY MEDICATION Medication Name: Pulse oximeter DME:[facility] DX J44.9....G03.78 01/08/18   Azriella Mattia, MD  aspirin  81 MG chewable tablet Chew 81 mg by mouth at bedtime.     [provider]  budesonide  (PULMICORT ) 0.25 MG/2ML nebulizer solution Take 2 mLs (0.25 mg total) by nebulization 2 (two) times daily. 12/31/17   Conforti, John, DO  cetirizine  (ZYRTEC ) 10 MG tablet Place 10 mg into feeding tube daily.     [provider]  clonazePAM  (KLONOPIN ) 0.5 MG tablet Place 1 tablet (0.5 mg total) into feeding tube daily. Patient taking differently: Place 1 mg into feeding tube 2 (two) times daily. (may also take as needed for anxiety) 01/07/18   Victoriano Campion,  Nickolas,  MD  famotidine  (PEPCID ) 20 MG tablet Place 1 tablet (20 mg total) into feeding tube daily. 12/31/17   Conforti, John, DO  fentaNYL  (DURAGESIC  - DOSED MCG/HR) 25 MCG/HR patch Place 1 patch (25 mcg total) onto the skin every 3 (three) days. 01/07/18   Galilee Pierron, MD  fluticasone  (FLONASE ) 50 MCG/ACT nasal spray Place 1 spray into both nostrils daily.     [provider]  ipratropium-albuterol  (DUONEB) 0.5-2.5 (3) MG/3ML SOLN Take 3 mLs by nebulization every 6 (six) hours. Patient taking differently: Take 3 mLs by nebulization every 6 (six) hours as needed (for shortness of breath).  12/31/17   Conforti, John, DO  mirtazapine  (REMERON ) 30 MG tablet Take 30 mg by mouth at bedtime.    [provider]  oxyCODONE -acetaminophen  (PERCOCET/ROXICET) 5-325 MG tablet Place 1 tablet into feeding tube every 8 (eight) hours as needed for severe pain. 01/07/18   Kylynn Street, MD  PARoxetine  (PAXIL ) 20 MG tablet Place 1 tablet (20 mg total) into feeding tube at bedtime. 12/31/17   Conforti, John, DO  polyethylene glycol (MIRALAX  / GLYCOLAX ) 17 g packet Place 17 g into feeding tube daily.    [provider]  predniSONE  (DELTASONE ) 20 MG tablet Take 2 tablets (40 mg total) by mouth daily with breakfast. 10/23/18   Blakeney, Dana G, NP  QUEtiapine  (SEROQUEL ) 25 MG tablet Place 1 tablet (25 mg total) into feeding tube at bedtime. 12/31/17   Conforti, John, DO  senna (SENOKOT) 8.6 MG TABS tablet Place 1-2 tablets into feeding tube at bedtime.     [provider]  simvastatin  (ZOCOR ) 20 MG tablet Take 40 mg by mouth every evening.     [provider]   Allergies  Allergen Reactions   Amoxicillin      Patient has tolerated cephalosporins in the past   Ativan  [Lorazepam ]     Makes anxiety worse     FAMILY HISTORY:  family history includes Asthma in her mother; Breast cancer (age of onset: 30) in her maternal aunt; CAD in her father; COPD in her sister; CVA in her father; Lung  cancer in her mother; Stroke in her father. SOCIAL HISTORY:  reports that she quit smoking about 6 years ago. Her smoking use included cigarettes. She started smoking about 47 years ago. She has a 4.1 pack-year smoking history. She has never used smokeless tobacco. She reports that she does not currently use drugs after having used the following drugs: Crack cocaine. She reports that she does not drink alcohol.   BP (!) 154/96 (BP Location: Right Arm, Patient Position: Sitting, Cuff Size: Normal)   Pulse (!) 103   Temp 97.6 F (36.4 C) (Temporal)   LMP 03/06/2005 (Approximate)   SpO2 96% Comment: 5L oxygen    Review of Systems: Gen:  Denies  fever, sweats, chills weight loss  HEENT: Denies blurred vision, double vision, ear pain, eye pain, hearing loss, nose bleeds, sore throat Cardiac:  No dizziness, chest pain or heaviness, chest tightness,edema, No JVD Resp:   No cough, +sputum production, +shortness of breath,+wheezing, +hemoptysis,  Other:  All other systems negative   Physical Examination:   General Appearance: No distress  EYES PERRLA, EOM intact.   NECK Supple, No JVD Pulmonary: normal breath sounds, No wheezing.  CardiovascularNormal S1,S2.  No m/r/g.   Abdomen: Benign, Soft, non-tender. 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 with severe debilitating respiratory disease status post tracheostomy chronic vent dependent with previous history of recurrent bouts of pneumonia and aspiration pneumonia and COVID-pneumonia   Hypercapnic respiratory failure from COPD Continue ventilator support as prescribed Patient needs ventilator to  survive Patient use and benefits from therapy  Hypoxic respiratory failure Continue oxygen as prescribed Patient needs this for survival   End-stage COPD Continue nebulized therapy with Pulmicort  nebs twice daily DuoNebs every 4-6 hours Albuterol  nebulizers as needed  COPD exacerbation Plan for ABX and PRED therapy  MEDICATION ADJUSTMENTS/LABS AND TESTS ORDERED: Continue nebulized therapy as prescribed Continue ventilator therapy as prescribed Continue oxygen as prescribed Prednisone  20 mg daily for 7 days Azithromycin  as prescribed for infection Avoid Allergens and Irritants Avoid secondhand smoke Avoid SICK contacts Recommend  Masking  when appropriate Recommend Keep up-to-date with vaccinations  CURRENT MEDICATIONS REVIEWED AT LENGTH WITH PATIENT TODAY   Patient/Family are satisfied with Plan of action and management. All questions answered  Follow-up in 6 months  Total time spent 42 minutes   Nickolas Alm Cellar, M.D.  [facility]  Medical Director [facility] Medical Director [facility] Cardio-Pulmonary Department

## 2023-08-28 ENCOUNTER — Ambulatory Visit: Payer: Self-pay | Admitting: Internal Medicine

## 2023-08-28 ENCOUNTER — Encounter: Payer: Self-pay | Admitting: Internal Medicine

## 2023-08-28 ENCOUNTER — Emergency Department

## 2023-08-28 ENCOUNTER — Other Ambulatory Visit: Payer: Self-pay

## 2023-08-28 ENCOUNTER — Inpatient Hospital Stay
Admission: EM | Admit: 2023-08-28 | Discharge: 2023-09-05 | DRG: 870 | Disposition: A | Discharge status: Home / Self Care | Attending: Student in an Organized Health Care Education/Training Program | Admitting: Student in an Organized Health Care Education/Training Program

## 2023-08-28 DIAGNOSIS — Z8249 Family history of ischemic heart disease and other diseases of the circulatory system: Secondary | ICD-10-CM

## 2023-08-28 DIAGNOSIS — J15212 Pneumonia due to Methicillin resistant Staphylococcus aureus: Secondary | ICD-10-CM | POA: Diagnosis present

## 2023-08-28 DIAGNOSIS — I110 Hypertensive heart disease with heart failure: Secondary | ICD-10-CM | POA: Diagnosis present

## 2023-08-28 DIAGNOSIS — A419 Sepsis, unspecified organism: Secondary | ICD-10-CM | POA: Diagnosis present

## 2023-08-28 DIAGNOSIS — F32A Depression, unspecified: Secondary | ICD-10-CM

## 2023-08-28 DIAGNOSIS — Z9911 Dependence on respirator [ventilator] status: Secondary | ICD-10-CM | POA: Diagnosis not present

## 2023-08-28 DIAGNOSIS — J95851 Ventilator associated pneumonia: Secondary | ICD-10-CM | POA: Diagnosis present

## 2023-08-28 DIAGNOSIS — I5032 Chronic diastolic (congestive) heart failure: Secondary | ICD-10-CM | POA: Diagnosis present

## 2023-08-28 DIAGNOSIS — Y848 Other medical procedures as the cause of abnormal reaction of the patient, or of later complication, without mention of misadventure at the time of the procedure: Secondary | ICD-10-CM | POA: Diagnosis present

## 2023-08-28 DIAGNOSIS — J151 Pneumonia due to Pseudomonas: Secondary | ICD-10-CM

## 2023-08-28 DIAGNOSIS — E1169 Type 2 diabetes mellitus with other specified complication: Secondary | ICD-10-CM | POA: Diagnosis present

## 2023-08-28 DIAGNOSIS — J440 Chronic obstructive pulmonary disease with acute lower respiratory infection: Secondary | ICD-10-CM | POA: Diagnosis present

## 2023-08-28 DIAGNOSIS — J189 Pneumonia, unspecified organism: Secondary | ICD-10-CM | POA: Diagnosis not present

## 2023-08-28 DIAGNOSIS — E119 Type 2 diabetes mellitus without complications: Secondary | ICD-10-CM

## 2023-08-28 DIAGNOSIS — G8929 Other chronic pain: Secondary | ICD-10-CM

## 2023-08-28 DIAGNOSIS — N390 Urinary tract infection, site not specified: Secondary | ICD-10-CM | POA: Diagnosis present

## 2023-08-28 DIAGNOSIS — G894 Chronic pain syndrome: Secondary | ICD-10-CM | POA: Diagnosis present

## 2023-08-28 DIAGNOSIS — E1165 Type 2 diabetes mellitus with hyperglycemia: Secondary | ICD-10-CM | POA: Diagnosis present

## 2023-08-28 DIAGNOSIS — R0603 Acute respiratory distress: Secondary | ICD-10-CM | POA: Diagnosis not present

## 2023-08-28 DIAGNOSIS — Z1624 Resistance to multiple antibiotics: Secondary | ICD-10-CM | POA: Diagnosis present

## 2023-08-28 DIAGNOSIS — Z794 Long term (current) use of insulin: Secondary | ICD-10-CM | POA: Diagnosis not present

## 2023-08-28 DIAGNOSIS — Z7982 Long term (current) use of aspirin: Secondary | ICD-10-CM

## 2023-08-28 DIAGNOSIS — Z931 Gastrostomy status: Secondary | ICD-10-CM | POA: Diagnosis not present

## 2023-08-28 DIAGNOSIS — B9562 Methicillin resistant Staphylococcus aureus infection as the cause of diseases classified elsewhere: Secondary | ICD-10-CM | POA: Diagnosis not present

## 2023-08-28 DIAGNOSIS — I2510 Atherosclerotic heart disease of native coronary artery without angina pectoris: Secondary | ICD-10-CM | POA: Diagnosis present

## 2023-08-28 DIAGNOSIS — E7800 Pure hypercholesterolemia, unspecified: Secondary | ICD-10-CM | POA: Diagnosis present

## 2023-08-28 DIAGNOSIS — Z7951 Long term (current) use of inhaled steroids: Secondary | ICD-10-CM

## 2023-08-28 DIAGNOSIS — J439 Emphysema, unspecified: Secondary | ICD-10-CM | POA: Diagnosis present

## 2023-08-28 DIAGNOSIS — Z7985 Long-term (current) use of injectable non-insulin antidiabetic drugs: Secondary | ICD-10-CM

## 2023-08-28 DIAGNOSIS — I252 Old myocardial infarction: Secondary | ICD-10-CM

## 2023-08-28 DIAGNOSIS — Z930 Tracheostomy status: Secondary | ICD-10-CM | POA: Diagnosis not present

## 2023-08-28 DIAGNOSIS — R6520 Severe sepsis without septic shock: Secondary | ICD-10-CM | POA: Diagnosis present

## 2023-08-28 DIAGNOSIS — I10 Essential (primary) hypertension: Secondary | ICD-10-CM | POA: Diagnosis not present

## 2023-08-28 DIAGNOSIS — J45901 Unspecified asthma with (acute) exacerbation: Secondary | ICD-10-CM | POA: Diagnosis present

## 2023-08-28 DIAGNOSIS — J449 Chronic obstructive pulmonary disease, unspecified: Secondary | ICD-10-CM | POA: Diagnosis not present

## 2023-08-28 DIAGNOSIS — F419 Anxiety disorder, unspecified: Secondary | ICD-10-CM

## 2023-08-28 DIAGNOSIS — Z6832 Body mass index (BMI) 32.0-32.9, adult: Secondary | ICD-10-CM

## 2023-08-28 DIAGNOSIS — E669 Obesity, unspecified: Secondary | ICD-10-CM | POA: Diagnosis present

## 2023-08-28 DIAGNOSIS — J9622 Acute and chronic respiratory failure with hypercapnia: Secondary | ICD-10-CM

## 2023-08-28 DIAGNOSIS — J441 Chronic obstructive pulmonary disease with (acute) exacerbation: Secondary | ICD-10-CM | POA: Diagnosis present

## 2023-08-28 DIAGNOSIS — R1084 Generalized abdominal pain: Secondary | ICD-10-CM | POA: Diagnosis not present

## 2023-08-28 DIAGNOSIS — J9621 Acute and chronic respiratory failure with hypoxia: Secondary | ICD-10-CM

## 2023-08-28 DIAGNOSIS — Z8619 Personal history of other infectious and parasitic diseases: Secondary | ICD-10-CM

## 2023-08-28 DIAGNOSIS — F418 Other specified anxiety disorders: Secondary | ICD-10-CM | POA: Diagnosis present

## 2023-08-28 DIAGNOSIS — G4700 Insomnia, unspecified: Secondary | ICD-10-CM | POA: Diagnosis present

## 2023-08-28 DIAGNOSIS — T17998A Other foreign object in respiratory tract, part unspecified causing other injury, initial encounter: Secondary | ICD-10-CM | POA: Diagnosis not present

## 2023-08-28 DIAGNOSIS — Z87891 Personal history of nicotine dependence: Secondary | ICD-10-CM

## 2023-08-28 DIAGNOSIS — Z5986 Financial insecurity: Secondary | ICD-10-CM

## 2023-08-28 DIAGNOSIS — J15211 Pneumonia due to Methicillin susceptible Staphylococcus aureus: Secondary | ICD-10-CM | POA: Diagnosis not present

## 2023-08-28 DIAGNOSIS — R0602 Shortness of breath: Secondary | ICD-10-CM | POA: Diagnosis present

## 2023-08-28 DIAGNOSIS — R918 Other nonspecific abnormal finding of lung field: Secondary | ICD-10-CM | POA: Diagnosis not present

## 2023-08-28 DIAGNOSIS — B965 Pseudomonas (aeruginosa) (mallei) (pseudomallei) as the cause of diseases classified elsewhere: Secondary | ICD-10-CM | POA: Diagnosis not present

## 2023-08-28 DIAGNOSIS — Z880 Allergy status to penicillin: Secondary | ICD-10-CM

## 2023-08-28 DIAGNOSIS — Z825 Family history of asthma and other chronic lower respiratory diseases: Secondary | ICD-10-CM

## 2023-08-28 DIAGNOSIS — Z888 Allergy status to other drugs, medicaments and biological substances status: Secondary | ICD-10-CM

## 2023-08-28 DIAGNOSIS — Z79899 Other long term (current) drug therapy: Secondary | ICD-10-CM

## 2023-08-28 LAB — CBC WITH DIFFERENTIAL/PLATELET
Abs Immature Granulocytes: 0.15 10*3/uL — ABNORMAL HIGH (ref 0.00–0.07)
Basophils Absolute: 0.1 10*3/uL (ref 0.0–0.1)
Basophils Relative: 0 %
Eosinophils Absolute: 0.3 10*3/uL (ref 0.0–0.5)
Eosinophils Relative: 2 %
HCT: 41.7 % (ref 36.0–46.0)
Hemoglobin: 13 g/dL (ref 12.0–15.0)
Immature Granulocytes: 1 %
Lymphocytes Relative: 22 %
Lymphs Abs: 3.5 10*3/uL (ref 0.7–4.0)
MCH: 26.2 pg (ref 26.0–34.0)
MCHC: 31.2 g/dL (ref 30.0–36.0)
MCV: 83.9 fL (ref 80.0–100.0)
Monocytes Absolute: 1.2 10*3/uL — ABNORMAL HIGH (ref 0.1–1.0)
Monocytes Relative: 8 %
Neutro Abs: 10.4 10*3/uL — ABNORMAL HIGH (ref 1.7–7.7)
Neutrophils Relative %: 67 %
Platelets: 332 10*3/uL (ref 150–400)
RBC: 4.97 MIL/uL (ref 3.87–5.11)
RDW: 14.7 % (ref 11.5–15.5)
WBC: 15.6 10*3/uL — ABNORMAL HIGH (ref 4.0–10.5)
nRBC: 0 % (ref 0.0–0.2)

## 2023-08-28 LAB — COMPREHENSIVE METABOLIC PANEL
ALT: 19 U/L (ref 0–44)
AST: 23 U/L (ref 15–41)
Albumin: 4.2 g/dL (ref 3.5–5.0)
Alkaline Phosphatase: 94 U/L (ref 38–126)
Anion gap: 12 (ref 5–15)
BUN: 11 mg/dL (ref 8–23)
CO2: 24 mmol/L (ref 22–32)
Calcium: 9.4 mg/dL (ref 8.9–10.3)
Chloride: 103 mmol/L (ref 98–111)
Creatinine, Ser: 0.65 mg/dL (ref 0.44–1.00)
GFR, Estimated: >60 mL/min (ref >60)
Glucose, Bld: 277 mg/dL — ABNORMAL HIGH (ref 70–99)
Potassium: 3.4 mmol/L — ABNORMAL LOW (ref 3.5–5.1)
Sodium: 139 mmol/L (ref 135–145)
Total Bilirubin: 0.6 mg/dL (ref 0.0–1.2)
Total Protein: 8.4 g/dL — ABNORMAL HIGH (ref 6.5–8.1)

## 2023-08-28 LAB — PROTIME-INR
INR: 1 (ref 0.8–1.2)
Prothrombin Time: 12.9 s (ref 11.4–15.2)

## 2023-08-28 LAB — RESP PANEL BY RT-PCR (RSV, FLU A&B, COVID)  RVPGX2
Influenza A by PCR: NEGATIVE
Influenza B by PCR: NEGATIVE
Resp Syncytial Virus by PCR: NEGATIVE
SARS Coronavirus 2 by RT PCR: NEGATIVE

## 2023-08-28 LAB — LACTIC ACID, PLASMA: Lactic Acid, Venous: 1.6 mmol/L (ref 0.5–1.9)

## 2023-08-28 LAB — APTT: aPTT: 28 s (ref 24–36)

## 2023-08-28 LAB — PROCALCITONIN: Procalcitonin: 0.16 ng/mL

## 2023-08-28 MED ORDER — SODIUM CHLORIDE 0.9 % IV SOLN
2.0000 g | Freq: Once | INTRAVENOUS | Status: AC
  Administered 2023-08-28: 2 g via INTRAVENOUS
  Filled 2023-08-28: qty 20

## 2023-08-28 MED ORDER — SODIUM CHLORIDE 0.9 % IV SOLN
500.0000 mg | Freq: Once | INTRAVENOUS | Status: AC
  Administered 2023-08-28: 500 mg via INTRAVENOUS
  Filled 2023-08-28: qty 5

## 2023-08-28 MED ORDER — METHYLPREDNISOLONE SODIUM SUCC 125 MG IJ SOLR: 60.0000 mg | Freq: Two times a day (BID) | INTRAMUSCULAR | Status: DC

## 2023-08-28 MED ORDER — MAGNESIUM SULFATE 2 GM/50ML IV SOLN
2.0000 g | INTRAVENOUS | Status: AC
  Administered 2023-08-28: 2 g via INTRAVENOUS
  Filled 2023-08-28: qty 50

## 2023-08-28 MED ORDER — IPRATROPIUM-ALBUTEROL 0.5-2.5 (3) MG/3ML IN SOLN
3.0000 mL | Freq: Four times a day (QID) | RESPIRATORY_TRACT | Status: DC
  Administered 2023-08-29 – 2023-09-05 (×31): 3 mL via RESPIRATORY_TRACT
  Filled 2023-08-28 (×31): qty 3

## 2023-08-28 MED ORDER — SODIUM CHLORIDE 0.9 % IV SOLN
500.0000 mg | INTRAVENOUS | Status: DC
  Administered 2023-08-29 – 2023-08-31 (×3): 500 mg via INTRAVENOUS
  Filled 2023-08-28 (×3): qty 5

## 2023-08-28 MED ORDER — SODIUM CHLORIDE 0.9 % IV SOLN: 2.0000 g | INTRAVENOUS | Status: DC

## 2023-08-28 MED ORDER — MORPHINE SULFATE (PF) 4 MG/ML IV SOLN
4.0000 mg | Freq: Once | INTRAVENOUS | Status: AC
  Administered 2023-08-28: 4 mg via INTRAVENOUS
  Filled 2023-08-28: qty 1

## 2023-08-28 MED ORDER — SODIUM CHLORIDE 0.9 % IV BOLUS (SEPSIS)
1000.0000 mL | Freq: Once | INTRAVENOUS | Status: AC
  Administered 2023-08-28: 1000 mL via INTRAVENOUS

## 2023-08-28 MED ORDER — ALBUTEROL SULFATE (2.5 MG/3ML) 0.083% IN NEBU
5.0000 mg | INHALATION_SOLUTION | Freq: Once | RESPIRATORY_TRACT | Status: AC
  Administered 2023-08-28: 5 mg via RESPIRATORY_TRACT
  Filled 2023-08-28: qty 6

## 2023-08-28 MED ORDER — PREDNISONE 20 MG PO TABS: 20.0000 mg | ORAL_TABLET | Freq: Every day | ORAL | 0 refills | Status: AC

## 2023-08-28 MED ORDER — GUAIFENESIN 100 MG/5ML PO LIQD
10.0000 mL | Freq: Four times a day (QID) | ORAL | Status: DC
Start: 2023-08-28 — End: 2023-08-30
  Administered 2023-08-28: 10 mL via ORAL
  Filled 2023-08-28: qty 10

## 2023-08-28 MED ORDER — SODIUM CHLORIDE 3 % IN NEBU
4.0000 mL | INHALATION_SOLUTION | Freq: Four times a day (QID) | RESPIRATORY_TRACT | Status: AC
Start: 2023-08-28 — End: 2023-08-30
  Administered 2023-08-29 – 2023-08-30 (×5): 4 mL via RESPIRATORY_TRACT
  Filled 2023-08-28 (×11): qty 4

## 2023-08-28 MED ORDER — LEVOFLOXACIN 750 MG PO TABS: 750.0000 mg | ORAL_TABLET | Freq: Every day | ORAL | 0 refills | Status: DC

## 2023-08-28 MED ORDER — POTASSIUM CHLORIDE 10 MEQ/100ML IV SOLN
10.0000 meq | INTRAVENOUS | Status: DC
  Administered 2023-08-28: 10 meq via INTRAVENOUS
  Filled 2023-08-28 (×3): qty 100

## 2023-08-28 MED ORDER — FAMOTIDINE 20 MG PO TABS
20.0000 mg | ORAL_TABLET | Freq: Two times a day (BID) | ORAL | Status: DC
  Administered 2023-08-28: 20 mg
  Filled 2023-08-28: qty 1

## 2023-08-28 MED ORDER — IOHEXOL 350 MG/ML SOLN
100.0000 mL | Freq: Once | INTRAVENOUS | Status: AC | PRN
  Administered 2023-08-28: 100 mL via INTRAVENOUS

## 2023-08-28 MED ORDER — METHYLPREDNISOLONE SODIUM SUCC 125 MG IJ SOLR
125.0000 mg | INTRAMUSCULAR | Status: AC
  Administered 2023-08-28: 125 mg via INTRAVENOUS
  Filled 2023-08-28: qty 2

## 2023-08-28 MED ORDER — IPRATROPIUM-ALBUTEROL 0.5-2.5 (3) MG/3ML IN SOLN
3.0000 mL | Freq: Once | RESPIRATORY_TRACT | Status: AC
  Administered 2023-08-28: 3 mL via RESPIRATORY_TRACT
  Filled 2023-08-28: qty 3

## 2023-08-28 NOTE — H&P (Signed)
 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 To review, patient has had 4-5 days of SOB, wheezing, and productive green sputum, low grade fevers.   Upon arrival to the ER patient was on 6 liters [place], Temp 97.9, HR 152, RR 24, BP 181/158. Michelle [name] was exchanged and patient placed on the ventilator. Cultures sent, ceftriaxone, azithromycin, solumedrol, and duonebs given. Labs revealed WBC 15.6, Lactic acid 1.6, Procalcitonin 0.16, K 3.4, glucose 277, Viral panel negative.  CTA thorax negative for PE, mucus plugging of right middle lobe and bilateral lower lobe mucous plugging with consolidations, and emphysema. CT abd/pelvis severe atherosclerotic plaque of the aorta and its branches, no acute pathology noted.   PCCM was consulted for admission to the ICU   Pertinent  Medical History  COPD/emphysema/asthma Chronic respiratory failure, trach placed 2019 HTN/HLD/CHF/CAD DM type 2 insulin dependent  Hep C Prior Tobacco and cocaine abuse Anxiety/Depression  Significant Hospital Events: Including procedures, antibiotic start and stop dates in addition to other pertinent events   3/26: Admit to ICU on home ventilator with acute on chronic respiratory failure. Cultures pending, broad spectrum antibiotics.   Interim History / Subjective:  Patient admitted to the ICU  Objective   Blood pressure (!) 145/121, pulse 90, temperature 97.9 F (36.6 C),  temperature source Oral, resp. rate 17, height 5' (1.524 m), weight 74.9 kg, last menstrual period 03/06/2005, SpO2 100%.    Vent Mode: PRVC FiO2 (%):  [40 %] 40 % Set Rate:  [16 bmp] 16 bmp Vt Set:  [400 mL] 400 mL PEEP:  [5 cmH20] 5 cmH20   Intake/Output Summary (Last 24 hours) at 08/29/2023 0307 Last data filed at 08/29/2023 0048 Gross per 24 hour  Intake 348.82 ml  Output --  Net 348.82 ml   Filed Weights   08/28/23 1922  Weight: 74.9 kg   Physical Exam Vitals reviewed.  Constitutional:      Appearance: Normal appearance. She is obese.  HENT:     Head: Normocephalic and atraumatic.     Right Ear: External ear normal.     Left Ear: External ear normal.     Nose: Nose normal.     Mouth/Throat:     Mouth: Mucous membranes are moist.  Eyes:     Extraocular Movements: Extraocular movements intact.     Pupils: Pupils are equal, round, and reactive to light.  Cardiovascular:     Pulses: Normal pulses.     Comments: Intermittently tachycardic  Pulmonary:     Comments: Trach and Ventilator, Moderate secretions, Lungs course with wheezes Abdominal:     General: Bowel sounds are normal.     Palpations: Abdomen is soft.  Musculoskeletal:        General: No swelling. Normal range of motion.     Cervical back: Normal range of motion.     Comments: 4/5 strength in all extremities.   Skin:    General: Skin is warm and dry.  Capillary Refill: Capillary refill takes less than 2 seconds.  Neurological:     General: No focal deficit present.     Mental Status: She is alert and oriented to person, place, and time. Mental status is at baseline.  Psychiatric:        Mood and Affect: Mood normal.    Resolved Hospital Problem list   None  Assessment & Plan:  Acute on Chronic Hypoxic and Hypercapnic Respiratory Failure d/t COPD Exacerbation w/ potential PNA Mucus Plugging, Bilateral PMHx: COPD/Emphysema/Asthma with chronic tracheostomy and home vent. Pseudomonas and  Acinetobacter PNA -Ventilator  -Settings: PRVC  8 mL/kg, 40% FiO2, 5 PEEP, continue ventilator support & lung protective strategies -Plateau pressures less than 30 cm H20 -Wean PEEP & FiO2 as tolerated, maintain SpO2 > 88% -Head of bed elevated 30 degrees, VAP protocol in place  -Intermittent chest x-ray & ABG PRN -Ensure adequate pulmonary hygiene  -Steroids initiated: Solu-medrol 40 mg BID (was given 125 mg in ER) -Budesonide nebs BID, Duo Neb Q 6 hrs, 3% saline nebs Q 6 hrs, Albuterol neb Q 6 hrs PRN, Guaifenesin Q 6 hr (once cleared for diet) -Morphine IV Q 3 hrs PRN for increased WOB -Cultures: BC x 2, Sputum, UA, Legionella and Strep Pneumoniae pending, RVP negative -Procalcitonin and Lactic normal -Antibiotics: Cefepime and Azythromycin  -CTA thorax negative for PE -SLP and PT consulted  Anxiety/Depression -Continue home meds once Med Rec completed -Valium 2.5 mg IV Q 4 hours until able to take oral medications, patient prefers Klonopin    Type 2 Diabetes Mellitus, insulin dependent -Monitor CBG Q 4 hours -SSI dosing Q 4 hours -Resume home Lantus pending med Rec   Hypertension Hx: Hyperlipidemia, CAD, CHF -Cardiac monitoring -MAP goal > 65, SBP < 160 -Continue home medications once evaluated by SLP  Best Practice (right click and "Reselect all SmartList Selections" daily)  Diet/type: NPO pending SLP evaluation. Baseline patient eats on the vent DVT prophylaxis prophylactic lovenox, SCDs  Pressure ulcer(s): N/A GI prophylaxis: H2B Lines: N/A Foley:  N/A Code Status:  full code per patient Last date of multidisciplinary goals of care discussion [All care explained to patient, all questions answered]  Labs   CBC: Recent Labs  Lab 08/28/23 1846 08/29/23 0101  WBC 15.6* 14.6*  NEUTROABS 10.4*  --   HGB 13.0 11.5*  HCT 41.7 36.3  MCV 83.9 84.0  PLT 332 257   Basic Metabolic Panel: Recent Labs  Lab 08/28/23 1846 08/29/23 0101  NA 139 138  K 3.4* 4.4  CL  103 107  CO2 24 23  GLUCOSE 277* 342*  BUN 11 10  CREATININE 0.65 0.64  CALCIUM 9.4 8.2*  MG  --  2.0  PHOS  --  3.2   GFR: Estimated Creatinine Clearance: 66 mL/min (by C-G formula based on SCr of 0.64 mg/dL). Recent Labs  Lab 08/28/23 1846 08/29/23 0101  PROCALCITON 0.16  --   WBC 15.6* 14.6*  LATICACIDVEN 1.6 1.7   Liver Function Tests: Recent Labs  Lab 08/28/23 1846  AST 23  ALT 19  ALKPHOS 94  BILITOT 0.6  PROT 8.4*  ALBUMIN 4.2    Coagulation Profile: Recent Labs  Lab 08/28/23 1846  INR 1.0    Review of Systems:   Patient denies chest pain, headache, vision changes, abdominal pain, nausea, vomiting, numbness tingling. Complains of anxiety, shortness of breath  Past Medical History:  She,  has a past medical history of Allergy, Anxiety, Asthma, CHF (congestive heart failure) (HCC),  Cocaine abuse (HCC), COPD (chronic obstructive pulmonary disease) (HCC), Coronary artery disease, Emphysema/COPD (HCC), Hepatitis C, High cholesterol, Hypertension, Pneumothorax, and Tobacco abuse.   Surgical History:   Past Surgical History:  Procedure Laterality Date   CARDIAC CATHETERIZATION     CHEST TUBE INSERTION     IR GASTROSTOMY TUBE MOD SED  10/31/2017   TRACHEOSTOMY TUBE PLACEMENT N/A 10/17/2017   Procedure: TRACHEOSTOMY;  Surgeon: Bud Face, MD;  Location: [facility] ORS;  Service: ENT;  Laterality: N/A;    Social History:   reports that she quit smoking about 6 years ago. Her smoking use included cigarettes. She started smoking about 47 years ago. She has a 4.1 pack-year smoking history. She has never used smokeless tobacco. She reports that she does not currently use drugs after having used the following drugs: "Crack" cocaine. She reports that she does not drink alcohol.   Family History:  Her family history includes Asthma in her mother; Breast cancer (age of onset: 23) in her maternal aunt; CAD in her father; COPD in her sister; CVA in her father; Lung cancer  in her mother; Stroke in her father.   Allergies Allergies  Allergen Reactions   Amoxicillin     Patient has tolerated cephalosporins in the past   Ativan [Lorazepam]     Makes anxiety worse     Home Medications  Prior to Admission medications   Medication Sig Start Date End Date Taking? Authorizing Provider  acetaminophen (TYLENOL) 325 MG tablet Take 2 tablets (650 mg total) by mouth every 6 (six) hours as needed for mild pain (pain score 1-3) (or Fever >/= 101). 04/24/23   Loyce Dys, MD  albuterol (PROVENTIL) (2.5 MG/3ML) 0.083% nebulizer solution Take 3 mLs (2.5 mg total) by nebulization every 6 (six) hours as needed for wheezing or shortness of breath. 03/27/23   Salena Saner, MD  albuterol (VENTOLIN HFA) 108 (90 Base) MCG/ACT inhaler Inhale 2 puffs into the lungs every 6 (six) hours as needed for wheezing or shortness of breath. 04/02/22   Parrett, Virgel Bouquet, NP  amLODipine (NORVASC) 5 MG tablet Take 5 mg by mouth daily. 08/27/21   [provider]  aspirin 81 MG chewable tablet Chew 81 mg by mouth daily.     [provider]  azelastine (ASTELIN) 0.1 % nasal spray SMARTSIG:1-2 Spray(s) Both Nares Every 12 Hours PRN 09/22/20   [provider]  azithromycin (ZITHROMAX Z-PAK) 250 MG tablet Take 2 tablets on Day 1 and then 1 tablet daily till gone. 06/17/23   Erin Fulling, MD  benzonatate (TESSALON PERLES) 100 MG capsule Take 1-2 capsules (100-200 mg total) by mouth 3 (three) times daily as needed for cough. 02/22/23   Sunnie Nielsen, DO  Blood Glucose Monitoring Suppl ([employer] BLOOD GLUCOSE METER) w/Device KIT Use as directed to monitor blood glucose up to qid 02/22/23   Sunnie Nielsen, DO  cetirizine (ZYRTEC) 10 MG tablet Take 10 mg by mouth daily. 03/21/22   [provider]  clonazePAM (KLONOPIN) 0.5 MG tablet Take 0.5 mg by mouth in the morning, at noon, and at bedtime.    [provider]  cloNIDine (CATAPRES - DOSED IN MG/24 HR) 0.1  mg/24hr patch Place 0.1 mg onto the skin once a week.    [provider]  D3-50 1.25 MG (50000 UT) capsule Take 50,000 Units by mouth once a week. 12/28/22   [provider]  docusate sodium (COLACE) 100 MG capsule Take 2 capsules (200 mg total) by  mouth daily as needed for mild constipation. 04/24/23   Loyce Dys, MD  fluticasone (FLONASE) 50 MCG/ACT nasal spray Place 1 spray into both nostrils daily.    [provider]  guaiFENesin (MUCINEX) 600 MG 12 hr tablet Take 600 mg by mouth 2 (two) times daily.    [provider]  hydrOXYzine (VISTARIL) 25 MG capsule Take 25 mg by mouth every 6 (six) hours as needed. 10/01/22   [provider]  ipratropium-albuterol (DUONEB) 0.5-2.5 (3) MG/3ML SOLN Take 3 mLs by nebulization every 6 (six) hours as needed (for shortness of breath). 06/17/23   Erin Fulling, MD  LANTUS SOLOSTAR 100 UNIT/ML Solostar Pen Inject 10 Units into the skin at bedtime. 01/03/23   [provider]  levofloxacin (LEVAQUIN) 750 MG tablet Take 1 tablet (750 mg total) by mouth daily. 08/28/23   Erin Fulling, MD  mirtazapine (REMERON) 30 MG tablet Take 30 mg by mouth at bedtime. 01/19/21   [provider]  mupirocin ointment (BACTROBAN) 2 % Apply 1 Application topically daily. To inside nose and to affected area of axilla 05/21/22   Moye, [place], MD  naloxone [facility]) nasal spray 4 mg/0.1 mL Place 1 spray into the nose once. 10/22/22   [provider]  NOVOLOG FLEXPEN 100 UNIT/ML FlexPen Inject into the skin 3 (three) times daily with meals. Sliding scale mealtime insulin. 03/01/21   [provider]  nystatin ointment (MYCOSTATIN) Apply 1 Application topically 3 (three) times daily. 01/04/23   [provider]  oxyCODONE-acetaminophen (PERCOCET/ROXICET) 5-325 MG tablet Take 1 tablet by mouth 3 (three) times daily as needed for pain. 09/07/21   [provider]  PARoxetine (PAXIL) 40 MG tablet Take 40 mg  by mouth every morning. Give 30 mg x 1 week (1.5 tab of 20 mg) then start 40 mg daily. D/c 20 mg pills at that time.    [provider]  polyethylene glycol powder (GLYCOLAX/MIRALAX) 17 GM/SCOOP powder Take by mouth.    [provider]  predniSONE (DELTASONE) 20 MG tablet Take 1 tablet (20 mg total) by mouth daily with breakfast. 7 days 06/17/23   Erin Fulling, MD  predniSONE (DELTASONE) 20 MG tablet Take 1 tablet (20 mg total) by mouth daily with breakfast for 10 days. 10 days 08/28/23 09/07/23  Erin Fulling, MD  PULMICORT 0.5 MG/2ML nebulizer solution  02/13/21   [provider]  terbinafine (LAMISIL) 1 % cream Apply 1 application  topically 2 (two) times daily. To affected skin, for 1 week after resolution of rash. # 30 gm, 1 refill.    [provider]  TRULICITY 4.5 MG/0.5ML SOPN Inject 4.5 mg into the skin once a week. 09/28/21   [provider]  Vitamin D, Ergocalciferol, (DRISDOL) 1.25 MG (50000 UNIT) CAPS capsule Take 50,000 Units by mouth 2 (two) times a week.    [provider]  fluconazole (DIFLUCAN) 150 MG tablet Take 1 tablet day 1 repeat in 1 week 11/15/20   Neale Burly, [place], MD    Critical care time: 85 minutes

## 2023-08-28 NOTE — ED Provider Notes (Signed)
 [facility] Provider Note    Event Date/Time   First MD Initiated Contact with Patient 08/28/23 1845     (approximate)   History   Chief Complaint: Respiratory Distress   HPI  Michelle [name] is a 63 y.o. female with a history of COPD, chronic tracheostomy and ventilator dependence, hypertension, CHF, anxiety who comes to the ED due to shortness of breath, fever, reductive cough since yesterday.  EMS report the patient's oxygen saturation was 90% on her usual 6 L oxygen delivered via home vent.  Patient complains of increased chest pain.  No body aches or abdominal pain.          Physical Exam   Triage Vital Signs: ED Triage Vitals  Encounter Vitals Group     BP 08/28/23 1843 (!) 181/158     Systolic BP Percentile --      Diastolic BP Percentile --      Pulse Rate 08/28/23 1843 (!) 152     Resp 08/28/23 1843 (!) 24     Temp 08/28/23 1843 97.9 F (36.6 C)     Temp Source 08/28/23 1843 Oral     SpO2 08/28/23 1843 99 %     Weight --      Height --      Head Circumference --      Peak Flow --      Pain Score 08/28/23 1844 0     Pain Loc --      Pain Education --      Exclude from Growth Chart --     Most recent vital signs: Vitals:   08/28/23 2100 08/28/23 2236  BP: 133/80 127/81  Pulse: (!) 125 98  Resp: (!) 32 (!) 27  Temp:    SpO2: 100% 100%    General: Awake, moderate respiratory distress CV:  Good peripheral perfusion.  Tachycardia, heart rate 140.  Symmetric distal pulses Resp:  Tachypnea, increased work of breathing with accessory muscle use.  Diffuse expiratory wheezing and prolonged expiratory phase.  Crackles at the left base. Abd:  No distention.  Soft with mild generalized tenderness Other:  No lower extremity edema   ED Results / Procedures / Treatments   Labs (all labs ordered are listed, but only abnormal results are displayed) Labs Reviewed  COMPREHENSIVE METABOLIC PANEL - Abnormal; Notable for the following  components:      Result Value   Potassium 3.4 (*)    Glucose, Bld 277 (*)    Total Protein 8.4 (*)    All other components within normal limits  CBC WITH DIFFERENTIAL/PLATELET - Abnormal; Notable for the following components:   WBC 15.6 (*)    Neutro Abs 10.4 (*)    Monocytes Absolute 1.2 (*)    Abs Immature Granulocytes 0.15 (*)    All other components within normal limits  RESP PANEL BY RT-PCR (RSV, FLU A&B, COVID)  RVPGX2  CULTURE, BLOOD (ROUTINE X 2)  CULTURE, BLOOD (ROUTINE X 2)  MRSA NEXT GEN BY PCR, NASAL  LACTIC ACID, PLASMA  PROTIME-INR  APTT  PROCALCITONIN  URINALYSIS, W/ REFLEX TO CULTURE (INFECTION SUSPECTED)  LACTIC ACID, PLASMA  URINALYSIS, COMPLETE (UACMP) WITH MICROSCOPIC     EKG Interpreted by me Sinus tachycardia rate 152.  Right axis, right bundle branch block.  Normal QRS ST segments and T waves.   RADIOLOGY Chest x-ray interpreted by me, no focal consolidation or pneumothorax.  Radiology report reviewed   PROCEDURES:  .Critical Care  Performed  by: Sharman Cheek, MD Authorized by: Sharman Cheek, MD   Critical care provider statement:    Critical care time (minutes):  35   Critical care time was exclusive of:  Separately billable procedures and treating other patients   Critical care was necessary to treat or prevent imminent or life-threatening deterioration of the following conditions:  Sepsis and respiratory failure   Critical care was time spent personally by me on the following activities:  Development of treatment plan with patient or surrogate, discussions with consultants, evaluation of patient's response to treatment, examination of patient, obtaining history from patient or surrogate, ordering and performing treatments and interventions, ordering and review of laboratory studies, ordering and review of radiographic studies, pulse oximetry, re-evaluation of patient's condition, review of old charts and ventilator management   Care  discussed with: admitting provider      MEDICATIONS ORDERED IN ED: Medications  azithromycin (ZITHROMAX) 500 mg in sodium chloride 0.9 % 250 mL IVPB (has no administration in time range)  cefTRIAXone (ROCEPHIN) 2 g in sodium chloride 0.9 % 100 mL IVPB (has no administration in time range)  potassium chloride 10 mEq in 100 mL IVPB (10 mEq Intravenous New Bag/Given 08/28/23 2243)  guaiFENesin (ROBITUSSIN) 100 MG/5ML liquid 10 mL (10 mLs Oral Given 08/28/23 2237)  ipratropium-albuterol (DUONEB) 0.5-2.5 (3) MG/3ML nebulizer solution 3 mL (has no administration in time range)  sodium chloride HYPERTONIC 3 % nebulizer solution 4 mL (has no administration in time range)  methylPREDNISolone sodium succinate (SOLU-MEDROL) 125 mg/2 mL injection 60 mg (has no administration in time range)  famotidine (PEPCID) tablet 20 mg (20 mg Per Tube Given 08/28/23 2236)  cefTRIAXone (ROCEPHIN) 2 g in sodium chloride 0.9 % 100 mL IVPB (0 g Intravenous Stopped 08/28/23 2000)  azithromycin (ZITHROMAX) 500 mg in sodium chloride 0.9 % 250 mL IVPB (0 mg Intravenous Stopped 08/28/23 2107)  sodium chloride 0.9 % bolus 1,000 mL (0 mLs Intravenous Stopped 08/28/23 2000)  ipratropium-albuterol (DUONEB) 0.5-2.5 (3) MG/3ML nebulizer solution 3 mL (3 mLs Nebulization Given 08/28/23 1851)  albuterol (PROVENTIL) (2.5 MG/3ML) 0.083% nebulizer solution 5 mg (5 mg Nebulization Given 08/28/23 1850)  methylPREDNISolone sodium succinate (SOLU-MEDROL) 125 mg/2 mL injection 125 mg (125 mg Intravenous Given 08/28/23 1851)  magnesium sulfate IVPB 2 g 50 mL (0 g Intravenous Stopped 08/28/23 2000)  morphine (PF) 4 MG/ML injection 4 mg (4 mg Intravenous Given 08/28/23 1915)  iohexol (OMNIPAQUE) 350 MG/ML injection 100 mL (100 mLs Intravenous Contrast Given 08/28/23 2154)     IMPRESSION / MDM / ASSESSMENT AND PLAN / ED COURSE  I reviewed the triage vital signs and the nursing notes.  DDx: Pneumonia, COPD exacerbation, non-STEMI, pulmonary edema,  COVID, influenza, electrolyte derangement, sepsis  Patient's presentation is most consistent with acute presentation with potential threat to life or bodily function.  Patient presents with tachycardia, tachypnea, respiratory distress.  Antibiotics, IV fluid bolus ordered for initial management while sepsis workup is being pursued.  Initial lab panel all unremarkable except for leukocytosis, COVID and flu negative.  CT chest abdomen pelvis obtained which does reveal mucous plugging and associated areas of consolidation.  Breathing comfort improved with bronchodilators, increased vent support in the ED.   Clinical Course as of 08/28/23 2334  Wed Aug 28, 2023  2202 D/w ICU team who will admit [PS]    Clinical Course User Index [PS] Sharman Cheek, MD     FINAL CLINICAL IMPRESSION(S) / ED DIAGNOSES   Final diagnoses:  COPD exacerbation (HCC)  Ventilator  dependent [facility])  Tracheostomy in place [facility])     Rx / DC Orders   ED Discharge Orders     None        Note:  This document was prepared using Dragon voice recognition software and may include unintentional dictation errors.   Sharman Cheek, MD 08/28/23 [phone]

## 2023-08-28 NOTE — Telephone Encounter (Signed)
 Chief Complaint: SOB Symptoms: wheezing, productive cough Frequency: x 4-5 days Pertinent Negatives: Patient denies fever, CP Disposition: [] ED /[x] Urgent Care (no appt availability in office) / [] Appointment(In office/virtual)/ []  [facility]/ [] Home Care/ [] Refused Recommended Disposition /[]  Mobile Bus/ [x]  Follow-up with PCP Additional Notes: Pt daughter [place] calling-- c/o SOB, wheezing, and productive green cough x 4-5 days. Reports pt has been requiring Albuterol q5hrs. Also endorses low-grade fever and reports previous hospitalization d/t similar symptoms. Triager attempted to schedule with Pulm, but no access. Triager reinforced disposition, and advised to call PCP for appt and if no access, go to UC. Caregiver verbalized understanding and to call back/911 with worsening symptoms. Triager will forward encounter for Dr. Belia Heman to review and advise. Caregiver verbalized understanding and is expecting call back from office for next steps.     Copied from CRM [id number]. Topic: Clinical - Red Word Triage >> Aug 28, 2023  2:25 PM Chantha C wrote: Red Word that prompted transfer to Nurse Triage: Patient's child [place] [phone] is having low grade fever, more frequency of being out of breath, unsure of the wheezing because patient is on a ventilator, more green mucous coming up . Patient denies pain. Reason for Disposition  [1] Longstanding difficulty breathing (e.g., CHF, COPD, emphysema) AND [2] WORSE than normal  Answer Assessment - Initial Assessment Questions E2C2 Pulmonary Triage - Initial Assessment Questions "Chief Complaint (e.g., cough, sob, wheezing, fever, chills, sweat or additional symptoms) *Go to specific symptom protocol after initial questions. SOB, wheeze, productive cough  "How long have symptoms been present?" 4-5 days ago  Have you tested for COVID or Flu? Note: If not, ask patient if a home test can be taken. If so, instruct patient to call  back for positive results. No, but will test and call back with positive  MEDICINES:   "Have you used any OTC meds to help with symptoms?" No If yes, ask "What medications?" N/a  "Have you used your inhalers/maintenance medication?" Yes If yes, "What medications?" Duo-Neb - q6 hrs - has been having to give q5 hrs, has been doing q5hrs x4-5 days Unable to do rescue INH d/t trach/vent  If inhaler, ask "How many puffs and how often?" Note: Review instructions on medication in the chart. See above  OXYGEN: "Do you wear supplemental oxygen?" Yes If yes, "How many liters are you supposed to use?" 6L ATC, reports increases 8L with exertion only  "Do you monitor your oxygen levels?" Yes If yes, "What is your reading (oxygen level) today?" 96%  "What is your usual oxygen saturation reading?"  (Note: Pulmonary O2 sats should be 90% or greater) High 90s    3. PATTERN "Does the difficult breathing come and go, or has it been constant since it started?"      Comes and goes 4. SEVERITY: "How bad is your breathing?" (e.g., mild, moderate, severe)    - MILD: No SOB at rest, mild SOB with walking, speaks normally in sentences, can lie down, no retractions, pulse < 100.    - MODERATE: SOB at rest, SOB with minimal exertion and prefers to sit, cannot lie down flat, speaks in phrases, mild retractions, audible wheezing, pulse 100-120.    - SEVERE: Very SOB at rest, speaks in single words, struggling to breathe, sitting hunched forward, retractions, pulse > 120      Mild-moderate 5. RECURRENT SYMPTOM: "Have you had difficulty breathing before?" If Yes, ask: "When was the last time?" and "What happened that time?"  Daughter reports yes, and usually ends up hospitalized Reports has been prescribed abx/steroids in past with + results 6. CARDIAC HISTORY: "Do you have any history of heart disease?" (e.g., heart attack, angina, bypass surgery, angioplasty)      CHF, MI x 8 years ago 7. LUNG  HISTORY: "Do you have any history of lung disease?"  (e.g., pulmonary embolus, asthma, emphysema)     Empysema, COPD 8. CAUSE: "What do you think is causing the breathing problem?"      unknown 9. OTHER SYMPTOMS: "Do you have any other symptoms? (e.g., dizziness, runny nose, cough, chest pain, fever)     Low grade fever (99.4), low energy Productive cough Denies CP, dizziness  Protocols used: Breathing Difficulty-A-AH

## 2023-08-28 NOTE — ED Triage Notes (Signed)
 [facility] reports pt coming from home. Caretaker reports pt has been having fevers and coughing up mucous. Pt is trach dependent. Pt is normally on 6L at home. Was sating at 90% on the 6L. EMS reports pt was diminished upon arrival.

## 2023-08-28 NOTE — ED Notes (Signed)
 Pts brief changed, peri care performed, new linens and brief applied, pt repositioned, and warm blankets applied. Pt resting comfortably at this time.

## 2023-08-28 NOTE — Progress Notes (Signed)
 Elink monitoring for the code sepsis protocol.

## 2023-08-28 NOTE — Telephone Encounter (Signed)
 Copied from CRM [id number]. Topic: Clinical - Prescription Issue >> Aug 28, 2023  4:05 PM Isabell A wrote: Reason for CRM: Myrene Buddy from [facility] calling, states the pharmacy only has levofloxacin (LEVAQUIN) 500MG .   Callback number: [phone]  Requesting a verbal ok, pharmacy closes at 5pm.

## 2023-08-28 NOTE — ED Notes (Signed)
 RT at bedside - Pt to CT.

## 2023-08-28 NOTE — Telephone Encounter (Signed)
 Per secure chat with Dr. Belia Heman, he sent in Prednisone and Levaquin.  I have notified the patient.  Nothing further needed.

## 2023-08-28 NOTE — H&P (Incomplete)
 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 To review, patient has had 4-5 days of SOB, wheezing, and cough with green sputum, low grade fevers.   Upon arrival to the ER patient was on 6 liters [place], Temp 97.9, HR 152, RR 24, BP 181/158. [name] was exchanged and patient placed on the ventilator. Cultures sent, ceftriaxone, azithromycin, solumedrol, and duonebs given. Labs revealed WBC 15.6, Lactic acid 1.6, Procalcitonin 0.16, K 3.4, glucose 277, Viral panel negative.  CTA thorax negative for PE, mucus plugging of right middle lobe and bilateral lower lobe mucous plugging with consolidations, and emphysema. CT abd/pelvis severe atherosclerotic plaque of the aorta and its branches, no acute pathology noted.   PCCM was consulted for admission to the ICU   Pertinent  Medical History  COPD/emphysema/asthma Chronic respiratory failure, trach placed 2019 HTN/HLD/CHF/CAD DM type 2 insulin dependent  Hep C Prior Tobacco and cocaine abuse Anxiety/Depression  Significant Hospital Events: Including procedures, antibiotic start and stop dates in addition to other pertinent events   Admit to ICU on home ventilator with acute on chronic respiratory failure.   Interim History / Subjective:  Patient admitted to the ICU  Objective   Blood pressure 127/81, pulse 98, temperature 97.9 F (36.6 C), temperature source Oral, resp. rate (!) 27, height 5' (1.524 m), weight 74.9 kg, last  menstrual period 03/06/2005, SpO2 100%.    Vent Mode: PRVC FiO2 (%):  [40 %] 40 % Set Rate:  [16 bmp] 16 bmp Vt Set:  [400 mL] 400 mL PEEP:  [5 cmH20] 5 cmH20  No intake or output data in the 24 hours ending 08/28/23 2318 Filed Weights   08/28/23 1922  Weight: 74.9 kg    Examination: General: *** HENT: *** Lungs: *** Cardiovascular: *** Abdomen: *** Extremities: *** Neuro: *** GU: ***  Resolved Hospital Problem list   None  Assessment & Plan:  Acute on Chronic Hypoxic and Hypercapnic Respiratory Failure d/t COPD exacerbation w/ potential infectious source Mucus Plugging, Bilateral PMHx: COPD/Emphysema/Asthma with chronic tracheostomy and home vent -Ventilator  -Settings: PRVC  8 mL/kg, 40% FiO2, 5 PEEP, continue ventilator support & lung protective strategies -Plateau pressures less than 30 cm H20 -Wean PEEP & FiO2 as tolerated, maintain SpO2 > 88% -Head of bed elevated 30 degrees, VAP protocol in place  -Intermittent chest x-ray & ABG PRN -Daily WUA with SBT as tolerated  -Ensure adequate pulmonary hygiene  -Steroids initiated: Solu-medrol 60 mg BID (was given 125 mg in ER) -Budesonide nebs BID, Duo Neb Q 6 hr, 3% saline nebs Q 6 hr, Guaifenesin Q 6 hr -Morphine IV PRN for increased WOB/anxiety - legionella, strep pna, respiratory viral panel pending send UA -CTA thorax negative for PE   Suspected sepsis without septic shock due to unknown source Initial interventions/workup included: 2.5 L of NS/LR & Cefepime/ Vancomycin  -BC x 2 and MRSA swab pending -RVP negative -UA pending -Lactic acid and procalcitonin normal -Daily CBC, monitor WBC/ fever curve - IV antibiotics: cefepime  &  vancomycin  - IVF hydration as needed - Strict I/O's: alert provider if UOP < 0.5 mL/kg/hr   Type 2 Diabetes Mellitus - Monitor CBG Q 4 hours - SSI dosing Q 4 hours   Hypertension Hyperlipidemia CAD CHF -Cardiac monitoring -MAP goal > 65 -Holding home  -Continue home    Anxiety/Depression -Continue home    Best Practice (right click and "Reselect all SmartList Selections" daily)   Diet/type: {diet type:25684} DVT prophylaxis {anticoagulation:25687} Pressure ulcer(s): {pressure ulcer(s):31683} GI prophylaxis: {WJ:19147} Lines: {Central Venous Access:25771} Foley:  {Central Venous Access:25691} Code Status:  {Code Status:26939} Last date of multidisciplinary goals of care discussion [***]  Labs   CBC: Recent Labs  Lab 08/28/23 1846  WBC 15.6*  NEUTROABS 10.4*  HGB 13.0  HCT 41.7  MCV 83.9  PLT 332    Basic Metabolic Panel: Recent Labs  Lab 08/28/23 1846  NA 139  K 3.4*  CL 103  CO2 24  GLUCOSE 277*  BUN 11  CREATININE 0.65  CALCIUM 9.4   GFR: Estimated Creatinine Clearance: 66 mL/min (by C-G formula based on SCr of 0.65 mg/dL). Recent Labs  Lab 08/28/23 1846  PROCALCITON 0.16  WBC 15.6*  LATICACIDVEN 1.6    Liver Function Tests: Recent Labs  Lab 08/28/23 1846  AST 23  ALT 19  ALKPHOS 94  BILITOT 0.6  PROT 8.4*  ALBUMIN 4.2   No results for input(s): "LIPASE", "AMYLASE" in the last 168 hours. No results for input(s): "AMMONIA" in the last 168 hours.  ABG    Component Value Date/Time   PHART 7.31 (L) 09/30/2021 0500   PCO2ART 46 09/30/2021 0500   PO2ART 155 (H) 09/30/2021 0500   HCO3 24.3 02/18/2023 1505   ACIDBASEDEF 1.3 02/18/2023 1505   O2SAT 93.5 02/18/2023 1505     Coagulation Profile: Recent Labs  Lab 08/28/23 1846  INR 1.0    Cardiac Enzymes: No results for input(s): "CKTOTAL", "CKMB", "CKMBINDEX", "TROPONINI" in the last 168 hours.  HbA1C: Hemoglobin A1C  Date/Time Value Ref Range Status  06/29/2013 04:02 AM 6.0 4.2 - 6.3 % Final    Comment:    The American Diabetes Association recommends that a primary goal of therapy should be <7% and that physicians should reevaluate the treatment regimen in patients with HbA1c values consistently >8%.    Hgb A1c MFr Bld  Date/Time Value Ref  Range Status  02/19/2023 04:33 AM 10.3 (H) 4.8 - 5.6 % Final    Comment:    (NOTE)         Prediabetes: 5.7 - 6.4         Diabetes: >6.4         Glycemic control for adults with diabetes: <7.0   09/30/2021 05:52 AM 10.2 (H) 4.8 - 5.6 % Final    Comment:    (NOTE) Pre diabetes:          5.7%-6.4%  Diabetes:              >6.4%  Glycemic control for   <7.0% adults with diabetes     CBG: No results for input(s): "GLUCAP" in the last 168 hours.  Review of Systems:   ***  Past Medical History:  She,  has a past medical history of Allergy, Anxiety, Asthma, CHF (congestive heart failure) (HCC), Cocaine abuse (HCC), COPD (chronic obstructive pulmonary disease) (HCC), Coronary artery disease, Emphysema/COPD (HCC), Hepatitis C, High cholesterol, Hypertension, Pneumothorax, and Tobacco abuse.   Surgical History:   Past Surgical History:  Procedure Laterality  Date  . CARDIAC CATHETERIZATION    . CHEST TUBE INSERTION    . IR GASTROSTOMY TUBE MOD SED  10/31/2017  . TRACHEOSTOMY TUBE PLACEMENT N/A 10/17/2017   Procedure: TRACHEOSTOMY;  Surgeon: Bud Face, MD;  Location: [facility] ORS;  Service: ENT;  Laterality: N/A;     Social History:   reports that she quit smoking about 6 years ago. Her smoking use included cigarettes. She started smoking about 47 years ago. She has a 4.1 pack-year smoking history. She has never used smokeless tobacco. She reports that she does not currently use drugs after having used the following drugs: "Crack" cocaine. She reports that she does not drink alcohol.   Family History:  Her family history includes Asthma in her mother; Breast cancer (age of onset: 28) in her maternal aunt; CAD in her father; COPD in her sister; CVA in her father; Lung cancer in her mother; Stroke in her father.   Allergies Allergies  Allergen Reactions  . Amoxicillin     Patient has tolerated cephalosporins in the past  . Ativan [Lorazepam]     Makes anxiety worse      Home  Medications  Prior to Admission medications   Medication Sig Start Date End Date Taking? Authorizing Provider  acetaminophen (TYLENOL) 325 MG tablet Take 2 tablets (650 mg total) by mouth every 6 (six) hours as needed for mild pain (pain score 1-3) (or Fever >/= 101). 04/24/23   Loyce Dys, MD  albuterol (PROVENTIL) (2.5 MG/3ML) 0.083% nebulizer solution Take 3 mLs (2.5 mg total) by nebulization every 6 (six) hours as needed for wheezing or shortness of breath. 03/27/23   Salena Saner, MD  albuterol (VENTOLIN HFA) 108 (90 Base) MCG/ACT inhaler Inhale 2 puffs into the lungs every 6 (six) hours as needed for wheezing or shortness of breath. 04/02/22   Parrett, Virgel Bouquet, NP  amLODipine (NORVASC) 5 MG tablet Take 5 mg by mouth daily. 08/27/21   [provider]  aspirin 81 MG chewable tablet Chew 81 mg by mouth daily.     [provider]  azelastine (ASTELIN) 0.1 % nasal spray SMARTSIG:1-2 Spray(s) Both Nares Every 12 Hours PRN 09/22/20   [provider]  azithromycin (ZITHROMAX Z-PAK) 250 MG tablet Take 2 tablets on Day 1 and then 1 tablet daily till gone. 06/17/23   Erin Fulling, MD  benzonatate (TESSALON PERLES) 100 MG capsule Take 1-2 capsules (100-200 mg total) by mouth 3 (three) times daily as needed for cough. 02/22/23   Sunnie Nielsen, DO  Blood Glucose Monitoring Suppl ([employer] BLOOD GLUCOSE METER) w/Device KIT Use as directed to monitor blood glucose up to qid 02/22/23   Sunnie Nielsen, DO  cetirizine (ZYRTEC) 10 MG tablet Take 10 mg by mouth daily. 03/21/22   [provider]  clonazePAM (KLONOPIN) 0.5 MG tablet Take 0.5 mg by mouth in the morning, at noon, and at bedtime.    [provider]  cloNIDine (CATAPRES - DOSED IN MG/24 HR) 0.1 mg/24hr patch Place 0.1 mg onto the skin once a week.    [provider]  D3-50 1.25 MG (50000 UT) capsule Take 50,000 Units by mouth once a week. 12/28/22   [provider]  docusate sodium  (COLACE) 100 MG capsule Take 2 capsules (200 mg total) by mouth daily as needed for mild constipation. 04/24/23   Loyce Dys, MD  fluticasone (FLONASE) 50 MCG/ACT nasal spray Place 1 spray into both nostrils daily.    [provider]  guaiFENesin (MUCINEX) 600 MG 12 hr tablet Take 600 mg by mouth 2 (two) times daily.    [provider]  hydrOXYzine (VISTARIL) 25 MG capsule Take 25 mg by mouth every 6 (six) hours as needed. 10/01/22   [provider]  ipratropium-albuterol (DUONEB) 0.5-2.5 (3) MG/3ML SOLN Take 3 mLs by nebulization every 6 (six) hours as needed (for shortness of breath). 06/17/23   Erin Fulling, MD  LANTUS SOLOSTAR 100 UNIT/ML Solostar Pen Inject 10 Units into the skin at bedtime. 01/03/23   [provider]  levofloxacin (LEVAQUIN) 750 MG tablet Take 1 tablet (750 mg total) by mouth daily. 08/28/23   Erin Fulling, MD  mirtazapine (REMERON) 30 MG tablet Take 30 mg by mouth at bedtime. 01/19/21   [provider]  mupirocin ointment (BACTROBAN) 2 % Apply 1 Application topically daily. To inside nose and to affected area of axilla 05/21/22   Moye, [place], MD  naloxone [facility]) nasal spray 4 mg/0.1 mL Place 1 spray into the nose once. 10/22/22   [provider]  NOVOLOG FLEXPEN 100 UNIT/ML FlexPen Inject into the skin 3 (three) times daily with meals. Sliding scale mealtime insulin. 03/01/21   [provider]  nystatin ointment (MYCOSTATIN) Apply 1 Application topically 3 (three) times daily. 01/04/23   [provider]  oxyCODONE-acetaminophen (PERCOCET/ROXICET) 5-325 MG tablet Take 1 tablet by mouth 3 (three) times daily as needed for pain. 09/07/21   [provider]  PARoxetine (PAXIL) 40 MG tablet Take 40 mg by mouth every morning. Give 30 mg x 1 week (1.5 tab of 20 mg) then start 40 mg daily. D/c 20 mg pills at that time.    [provider]  polyethylene glycol powder (GLYCOLAX/MIRALAX) 17 GM/SCOOP powder  Take by mouth.    [provider]  predniSONE (DELTASONE) 20 MG tablet Take 1 tablet (20 mg total) by mouth daily with breakfast. 7 days 06/17/23   Erin Fulling, MD  predniSONE (DELTASONE) 20 MG tablet Take 1 tablet (20 mg total) by mouth daily with breakfast for 10 days. 10 days 08/28/23 09/07/23  Erin Fulling, MD  PULMICORT 0.5 MG/2ML nebulizer solution  02/13/21   [provider]  terbinafine (LAMISIL) 1 % cream Apply 1 application  topically 2 (two) times daily. To affected skin, for 1 week after resolution of rash. # 30 gm, 1 refill.    [provider]  TRULICITY 4.5 MG/0.5ML SOPN Inject 4.5 mg into the skin once a week. 09/28/21   [provider]  Vitamin D, Ergocalciferol, (DRISDOL) 1.25 MG (50000 UNIT) CAPS capsule Take 50,000 Units by mouth 2 (two) times a week.    [provider]  fluconazole (DIFLUCAN) 150 MG tablet Take 1 tablet day 1 repeat in 1 week 11/15/20   Neale Burly, [place], MD     Critical care time: ***

## 2023-08-28 NOTE — Progress Notes (Signed)
 Pt transported to CT and returned to ED5 on the vent without incident. Pt remains on the vent and is tol well

## 2023-08-28 NOTE — Consult Note (Signed)
 CODE SEPSIS - PHARMACY COMMUNICATION  **Broad Spectrum Antibiotics should be administered within 1 hour of Sepsis diagnosis**  Time Code Sepsis Called/Page Received: 1846  Antibiotics Ordered: ceftriaxone and azithromycin   Time of 1st antibiotic administration: 1936  Additional action taken by pharmacy: n/a   Barrie Folk ,PharmD Clinical Pharmacist  08/28/2023  6:57 PM

## 2023-08-28 NOTE — ED Notes (Signed)
 X-ray at bedside

## 2023-08-28 NOTE — ED Notes (Signed)
 CCMD called and pt placed on cardiac monitoring.

## 2023-08-28 NOTE — Progress Notes (Signed)
 RT changed pt's trach  without incident due  to old trach not able to hold air to properly ventilate pt.

## 2023-08-29 DIAGNOSIS — J9622 Acute and chronic respiratory failure with hypercapnia: Secondary | ICD-10-CM | POA: Diagnosis not present

## 2023-08-29 DIAGNOSIS — J9621 Acute and chronic respiratory failure with hypoxia: Secondary | ICD-10-CM | POA: Diagnosis not present

## 2023-08-29 DIAGNOSIS — Z930 Tracheostomy status: Secondary | ICD-10-CM | POA: Diagnosis not present

## 2023-08-29 DIAGNOSIS — J441 Chronic obstructive pulmonary disease with (acute) exacerbation: Secondary | ICD-10-CM | POA: Diagnosis not present

## 2023-08-29 LAB — URINALYSIS, W/ REFLEX TO CULTURE (INFECTION SUSPECTED)
Bilirubin Urine: NEGATIVE
Glucose, UA: >=500 mg/dL — AB
Ketones, ur: NEGATIVE mg/dL
Nitrite: NEGATIVE
Protein, ur: 100 mg/dL — AB
Specific Gravity, Urine: >1.046 — ABNORMAL HIGH (ref 1.005–1.030)
pH: 6 (ref 5.0–8.0)

## 2023-08-29 LAB — RESPIRATORY PANEL BY PCR

## 2023-08-29 LAB — CBC
HCT: 36.3 % (ref 36.0–46.0)
HCT: 36.1 % (ref 36.0–46.0)
Hemoglobin: 11.5 g/dL — ABNORMAL LOW (ref 12.0–15.0)
Hemoglobin: 11.2 g/dL — ABNORMAL LOW (ref 12.0–15.0)
MCH: 26.6 pg (ref 26.0–34.0)
MCH: 26.1 pg (ref 26.0–34.0)
MCHC: 31.7 g/dL (ref 30.0–36.0)
MCHC: 31 g/dL (ref 30.0–36.0)
MCV: 84 fL (ref 80.0–100.0)
MCV: 84.1 fL (ref 80.0–100.0)
Platelets: 257 10*3/uL (ref 150–400)
Platelets: 270 10*3/uL (ref 150–400)
RBC: 4.32 MIL/uL (ref 3.87–5.11)
RBC: 4.29 MIL/uL (ref 3.87–5.11)
RDW: 14.6 % (ref 11.5–15.5)
RDW: 14.7 % (ref 11.5–15.5)
WBC: 14.6 10*3/uL — ABNORMAL HIGH (ref 4.0–10.5)
WBC: 14.2 10*3/uL — ABNORMAL HIGH (ref 4.0–10.5)
nRBC: 0 % (ref 0.0–0.2)
nRBC: 0 % (ref 0.0–0.2)

## 2023-08-29 LAB — BASIC METABOLIC PANEL WITH GFR
Anion gap: 8 (ref 5–15)
Anion gap: 10 (ref 5–15)
BUN: 10 mg/dL (ref 8–23)
BUN: 10 mg/dL (ref 8–23)
CO2: 23 mmol/L (ref 22–32)
CO2: 23 mmol/L (ref 22–32)
Calcium: 8.2 mg/dL — ABNORMAL LOW (ref 8.9–10.3)
Calcium: 8.3 mg/dL — ABNORMAL LOW (ref 8.9–10.3)
Chloride: 107 mmol/L (ref 98–111)
Chloride: 103 mmol/L (ref 98–111)
Creatinine, Ser: 0.64 mg/dL (ref 0.44–1.00)
Creatinine, Ser: 0.71 mg/dL (ref 0.44–1.00)
GFR, Estimated: >60 mL/min (ref >60)
GFR, Estimated: >60 mL/min (ref >60)
Glucose, Bld: 342 mg/dL — ABNORMAL HIGH (ref 70–99)
Glucose, Bld: 344 mg/dL — ABNORMAL HIGH (ref 70–99)
Potassium: 4.4 mmol/L (ref 3.5–5.1)
Potassium: 4.6 mmol/L (ref 3.5–5.1)
Sodium: 138 mmol/L (ref 135–145)
Sodium: 136 mmol/L (ref 135–145)

## 2023-08-29 LAB — MAGNESIUM
Magnesium: 2 mg/dL (ref 1.7–2.4)
Magnesium: 2 mg/dL (ref 1.7–2.4)

## 2023-08-29 LAB — BLOOD GAS, VENOUS
Acid-base deficit: 1.2 mmol/L (ref 0.0–2.0)
Acid-base deficit: 1.5 mmol/L (ref 0.0–2.0)
Bicarbonate: 25.7 mmol/L (ref 20.0–28.0)
Bicarbonate: 24.8 mmol/L (ref 20.0–28.0)
FIO2: 50 %
MECHVT: 400 mL
Mechanical Rate: 16
O2 Saturation: 97.6 %
O2 Saturation: 97.5 %
PEEP: 5 cmH2O
Patient temperature: 37
Patient temperature: 37
pCO2, Ven: 51 mmHg (ref 44–60)
pCO2, Ven: 47 mmHg (ref 44–60)
pH, Ven: 7.31 (ref 7.25–7.43)
pH, Ven: 7.33 (ref 7.25–7.43)
pO2, Ven: 74 mmHg — ABNORMAL HIGH (ref 32–45)
pO2, Ven: 79 mmHg — ABNORMAL HIGH (ref 32–45)

## 2023-08-29 LAB — GLUCOSE, CAPILLARY
Glucose-Capillary: 237 mg/dL — ABNORMAL HIGH (ref 70–99)
Glucose-Capillary: 259 mg/dL — ABNORMAL HIGH (ref 70–99)
Glucose-Capillary: 262 mg/dL — ABNORMAL HIGH (ref 70–99)
Glucose-Capillary: 263 mg/dL — ABNORMAL HIGH (ref 70–99)
Glucose-Capillary: 274 mg/dL — ABNORMAL HIGH (ref 70–99)
Glucose-Capillary: 274 mg/dL — ABNORMAL HIGH (ref 70–99)
Glucose-Capillary: 305 mg/dL — ABNORMAL HIGH (ref 70–99)

## 2023-08-29 LAB — PHOSPHORUS
Phosphorus: 3.2 mg/dL (ref 2.5–4.6)
Phosphorus: 2.9 mg/dL (ref 2.5–4.6)

## 2023-08-29 LAB — LACTIC ACID, PLASMA: Lactic Acid, Venous: 1.7 mmol/L (ref 0.5–1.9)

## 2023-08-29 LAB — HEMOGLOBIN A1C
Hgb A1c MFr Bld: 9.4 % — ABNORMAL HIGH (ref 4.8–5.6)
Mean Plasma Glucose: 223.08 mg/dL

## 2023-08-29 LAB — STREP PNEUMONIAE URINARY ANTIGEN: Strep Pneumo Urinary Antigen: NEGATIVE

## 2023-08-29 LAB — MRSA NEXT GEN BY PCR, NASAL: MRSA by PCR Next Gen: NOT DETECTED

## 2023-08-29 MED ORDER — FAMOTIDINE 20 MG PO TABS
20.0000 mg | ORAL_TABLET | Freq: Two times a day (BID) | ORAL | Status: DC
  Administered 2023-08-29 – 2023-09-05 (×14): 20 mg via ORAL
  Filled 2023-08-29 (×14): qty 1

## 2023-08-29 MED ORDER — MORPHINE SULFATE (PF) 2 MG/ML IV SOLN
2.0000 mg | INTRAVENOUS | Status: DC | PRN
  Administered 2023-08-29 – 2023-08-30 (×8): 2 mg via INTRAVENOUS
  Filled 2023-08-29 (×8): qty 1

## 2023-08-29 MED ORDER — INSULIN GLARGINE-YFGN 100 UNIT/ML ~~LOC~~ SOLN
10.0000 [IU] | Freq: Every day | SUBCUTANEOUS | Status: DC
  Administered 2023-08-29: 10 [IU] via SUBCUTANEOUS
  Filled 2023-08-29: qty 0.1

## 2023-08-29 MED ORDER — FAMOTIDINE 20 MG PO TABS
20.0000 mg | ORAL_TABLET | Freq: Two times a day (BID) | ORAL | Status: DC
  Administered 2023-08-29: 20 mg
  Filled 2023-08-29: qty 1

## 2023-08-29 MED ORDER — BUDESONIDE 0.25 MG/2ML IN SUSP
0.2500 mg | Freq: Two times a day (BID) | RESPIRATORY_TRACT | Status: DC
  Administered 2023-08-29 – 2023-09-05 (×16): 0.25 mg via RESPIRATORY_TRACT
  Filled 2023-08-29 (×17): qty 2

## 2023-08-29 MED ORDER — ORAL CARE MOUTH RINSE
15.0000 mL | OROMUCOSAL | Status: DC
  Administered 2023-08-29 – 2023-09-05 (×26): 15 mL via OROMUCOSAL

## 2023-08-29 MED ORDER — ADULT MULTIVITAMIN W/MINERALS CH
1.0000 | ORAL_TABLET | Freq: Every day | ORAL | Status: DC
  Administered 2023-08-30 – 2023-09-05 (×7): 1 via ORAL
  Filled 2023-08-29 (×7): qty 1

## 2023-08-29 MED ORDER — POTASSIUM CHLORIDE 20 MEQ PO PACK: 40.0000 meq | PACK | Freq: Two times a day (BID) | ORAL | Status: DC

## 2023-08-29 MED ORDER — ENSURE ENLIVE PO LIQD
237.0000 mL | Freq: Two times a day (BID) | ORAL | Status: DC
  Administered 2023-08-30 – 2023-09-01 (×3): 237 mL via ORAL

## 2023-08-29 MED ORDER — GUAIFENESIN 100 MG/5ML PO LIQD
10.0000 mL | Freq: Four times a day (QID) | ORAL | Status: AC
  Administered 2023-08-29 – 2023-08-30 (×5): 10 mL via ORAL
  Filled 2023-08-29 (×5): qty 10

## 2023-08-29 MED ORDER — INSULIN ASPART 100 UNIT/ML IJ SOLN
0.0000 [IU] | INTRAMUSCULAR | Status: DC
Start: 2023-08-29 — End: 2023-09-05
  Administered 2023-08-29: 11 [IU] via SUBCUTANEOUS
  Administered 2023-08-29: 7 [IU] via SUBCUTANEOUS
  Administered 2023-08-29 (×2): 11 [IU] via SUBCUTANEOUS
  Administered 2023-08-29: 7 [IU] via SUBCUTANEOUS
  Administered 2023-08-29: 15 [IU] via SUBCUTANEOUS
  Administered 2023-08-29 – 2023-08-30 (×2): 11 [IU] via SUBCUTANEOUS
  Administered 2023-08-30: 7 [IU] via SUBCUTANEOUS
  Administered 2023-08-30 – 2023-08-31 (×7): 4 [IU] via SUBCUTANEOUS
  Administered 2023-08-31: 11 [IU] via SUBCUTANEOUS
  Administered 2023-08-31: 7 [IU] via SUBCUTANEOUS
  Administered 2023-09-01: 4 [IU] via SUBCUTANEOUS
  Administered 2023-09-01: 7 [IU] via SUBCUTANEOUS
  Administered 2023-09-01: 3 [IU] via SUBCUTANEOUS
  Administered 2023-09-01: 15 [IU] via SUBCUTANEOUS
  Administered 2023-09-01: 4 [IU] via SUBCUTANEOUS
  Administered 2023-09-01: 3 [IU] via SUBCUTANEOUS
  Administered 2023-09-02: 4 [IU] via SUBCUTANEOUS
  Administered 2023-09-02: 7 [IU] via SUBCUTANEOUS
  Administered 2023-09-02: 3 [IU] via SUBCUTANEOUS
  Administered 2023-09-02: 7 [IU] via SUBCUTANEOUS
  Administered 2023-09-02: 15 [IU] via SUBCUTANEOUS
  Administered 2023-09-03: 7 [IU] via SUBCUTANEOUS
  Administered 2023-09-03 (×2): 4 [IU] via SUBCUTANEOUS
  Administered 2023-09-03 – 2023-09-04 (×3): 3 [IU] via SUBCUTANEOUS
  Administered 2023-09-04 (×3): 4 [IU] via SUBCUTANEOUS
  Administered 2023-09-05: 3 [IU] via SUBCUTANEOUS
  Administered 2023-09-05: 7 [IU] via SUBCUTANEOUS
  Filled 2023-08-29 (×39): qty 1

## 2023-08-29 MED ORDER — METHYLPREDNISOLONE SODIUM SUCC 40 MG IJ SOLR
40.0000 mg | Freq: Two times a day (BID) | INTRAMUSCULAR | Status: DC
  Administered 2023-08-29: 40 mg via INTRAVENOUS
  Filled 2023-08-29: qty 1

## 2023-08-29 MED ORDER — SODIUM CHLORIDE 0.9 % IV SOLN
2.0000 g | Freq: Three times a day (TID) | INTRAVENOUS | Status: DC
Start: 2023-08-29 — End: 2023-09-02
  Administered 2023-08-29 – 2023-09-02 (×13): 2 g via INTRAVENOUS
  Filled 2023-08-29 (×16): qty 12.5

## 2023-08-29 MED ORDER — LABETALOL HCL 5 MG/ML IV SOLN
10.0000 mg | INTRAVENOUS | Status: DC | PRN
Start: 2023-08-29 — End: 2023-09-05
  Administered 2023-08-29 – 2023-08-31 (×6): 10 mg via INTRAVENOUS
  Administered 2023-09-02: 7.5 mg via INTRAVENOUS
  Administered 2023-09-05: 10 mg via INTRAVENOUS
  Filled 2023-08-29 (×9): qty 4

## 2023-08-29 MED ORDER — CLONAZEPAM 0.5 MG PO TABS
0.5000 mg | ORAL_TABLET | Freq: Three times a day (TID) | ORAL | Status: DC
  Administered 2023-08-29 – 2023-09-05 (×22): 0.5 mg via ORAL
  Filled 2023-08-29 (×22): qty 1

## 2023-08-29 MED ORDER — ORAL CARE MOUTH RINSE: 15.0000 mL | OROMUCOSAL | Status: DC | PRN

## 2023-08-29 MED ORDER — DOCUSATE SODIUM 100 MG PO CAPS: 100.0000 mg | ORAL_CAPSULE | Freq: Two times a day (BID) | ORAL | Status: DC | PRN

## 2023-08-29 MED ORDER — POLYETHYLENE GLYCOL 3350 17 G PO PACK: 17.0000 g | PACK | Freq: Every day | ORAL | Status: DC | PRN

## 2023-08-29 MED ORDER — ENOXAPARIN SODIUM 40 MG/0.4ML IJ SOSY
40.0000 mg | PREFILLED_SYRINGE | INTRAMUSCULAR | Status: DC
  Administered 2023-08-29 – 2023-09-05 (×8): 40 mg via SUBCUTANEOUS
  Filled 2023-08-29 (×8): qty 0.4

## 2023-08-29 MED ORDER — GUAIFENESIN 100 MG/5ML PO LIQD
10.0000 mL | Freq: Four times a day (QID) | ORAL | Status: DC
Start: 2023-08-29 — End: 2023-08-29

## 2023-08-29 MED ORDER — ONDANSETRON HCL 4 MG/2ML IJ SOLN: 4.0000 mg | Freq: Four times a day (QID) | INTRAMUSCULAR | Status: DC | PRN

## 2023-08-29 MED ORDER — DIAZEPAM 5 MG/ML IJ SOLN
2.5000 mg | INTRAMUSCULAR | Status: DC | PRN
  Administered 2023-08-29 (×2): 2.5 mg via INTRAVENOUS
  Filled 2023-08-29 (×2): qty 2

## 2023-08-29 MED ORDER — ENOXAPARIN SODIUM 40 MG/0.4ML IJ SOSY: 0.5000 mg/kg | PREFILLED_SYRINGE | INTRAMUSCULAR | Status: DC

## 2023-08-29 MED ORDER — DIAZEPAM 5 MG/ML IJ SOLN
5.0000 mg | Freq: Four times a day (QID) | INTRAMUSCULAR | Status: DC | PRN
  Administered 2023-08-29 – 2023-09-01 (×10): 5 mg via INTRAVENOUS
  Filled 2023-08-29 (×10): qty 2

## 2023-08-29 MED ORDER — CHLORHEXIDINE GLUCONATE CLOTH 2 % EX PADS
6.0000 | MEDICATED_PAD | Freq: Every day | CUTANEOUS | Status: DC
  Administered 2023-08-29 – 2023-08-31 (×4): 6 via TOPICAL

## 2023-08-29 MED ORDER — POTASSIUM CHLORIDE 10 MEQ/100ML IV SOLN
10.0000 meq | INTRAVENOUS | Status: AC
  Administered 2023-08-29 (×4): 10 meq via INTRAVENOUS
  Filled 2023-08-29 (×4): qty 100

## 2023-08-29 MED ORDER — METHYLPREDNISOLONE SODIUM SUCC 40 MG IJ SOLR
20.0000 mg | Freq: Two times a day (BID) | INTRAMUSCULAR | Status: DC
Start: 2023-08-29 — End: 2023-08-31
  Administered 2023-08-29 – 2023-08-31 (×4): 20 mg via INTRAVENOUS
  Filled 2023-08-29 (×4): qty 1

## 2023-08-29 NOTE — Inpatient Diabetes Management (Signed)
 Inpatient Diabetes Program Recommendations  AACE/ADA: New Consensus Statement on Inpatient Glycemic Control (2015)  Target Ranges:  Prepandial:   less than 140 mg/dL      Peak postprandial:   less than 180 mg/dL (1-2 hours)      Critically ill patients:  140 - 180 mg/dL    Latest Reference Range & Units 02/19/23 04:33 08/29/23 01:01  Hemoglobin A1C 4.8 - 5.6 % 10.3 (H) 9.4 (H)  223 mg/dl  (H): Data is abnormally high  Latest Reference Range & Units 08/29/23 00:07 08/29/23 03:57 08/29/23 07:29  Glucose-Capillary 70 - 99 mg/dL 161 (H)  15 units Novolog  274 (H)  11 units Novolog  259 (H)  (H): Data is abnormally high    Admit with: Acute on chronic respiratory failure   History: DM, COPD/emphysema/asthma, Chronic respiratory failure, trach placed 2019  Home DM Meds: Lantus 10 units at bedtime       Novolog TID per SSI       Trulicity 4.5 mg Qweek  Current Orders: Novolog Resistant Correction Scale/ SSI (0-20 units) Q4 hours    Getting Solumedrol 40 mg BID (got 125 mg Solumedrol in the ED last PM)   MD- Note CBGs >250  Please consider starting Semglee 10 units daily this AM (home dose) Please start this AM   --Will follow patient during hospitalization--  Ambrose Finland RN, MSN, CDCES Diabetes Coordinator Inpatient Glycemic Control Team Team Pager: [phone] (8a-5p)

## 2023-08-29 NOTE — Evaluation (Signed)
 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   Clinical Impression  Patient alert, oriented, able to provide PLOF minimally via mouthing words to PT. At baseline the pt has 24/7 assist from her daughter that helps with bed bath, bed pan, bed mobility, and stand pivot transfers to her WC every other day. The pt was able to perform supine to sit with minA, extra time, fair sitting balance. After a seated rest break due to pt anxiety, dizziness (vitals WFLs), she was able to squat pivot to recliner, modA. Pt more limited by anxiety today than true weakness, and RN present throughout to help manage lines/leads. Pt appears to be near baseline level of functioning, PT to follow acutely to help prevent hospitalization deconditioning and encouragement for mobility.         If plan is discharge home, recommend the following: A lot of help with walking and/or transfers;A lot of help with bathing/dressing/bathroom;Assistance with cooking/housework;Assist for transportation;Help with stairs or ramp for entrance   Can travel by private vehicle        Equipment Recommendations Wheelchair cushion (measurements PT)  Recommendations for Other Services       Functional Status Assessment  (pt appears at functional baseline)     Precautions / Restrictions Precautions Precautions: Fall Restrictions Weight Bearing Restrictions Per Provider Order: No      Mobility  Bed Mobility Overal bed mobility: Needs Assistance Bed Mobility: Supine to  Sit     Supine to sit: Used rails, HOB elevated, Min assist          Transfers Overall transfer level: Needs assistance   Transfers: Sit to/from Stand, Bed to chair/wheelchair/BSC Sit to Stand: Mod assist     Squat pivot transfers: Mod assist     General transfer comment: pt reaches for recliner arm, handheld assist modA    Ambulation/Gait                  Stairs            Wheelchair Mobility     Tilt Bed    Modified Rankin (Stroke Patients Only)       Balance Overall balance assessment: Needs assistance Sitting-balance support: Feet supported Sitting balance-Leahy Scale: Fair     Standing balance support: Single extremity supported Standing balance-Leahy Scale: Poor Standing balance comment: pt reliant on UE support                             Pertinent Vitals/Pain Pain Assessment Pain Assessment: Faces Faces Pain Scale: No hurt    Home Living Family/patient expects to be discharged to:: Private residence Living Arrangements: Children Available Help at Discharge: Family;Available 24 hours/day Type of Home: Mobile home Home Access: Ramped entrance       Home Layout: One level Home Equipment: Wheelchair - manual      Prior Function Prior Level of Function : Needs assist       Physical Assist : Mobility (physical);ADLs (physical) Mobility (physical): Bed mobility;Transfers ADLs (physical): Bathing;Dressing;Toileting;IADLs;Grooming Mobility Comments: stand  pivot transfer to [facility], every other day. daughter assists with bed mobility as well       Extremity/Trunk Assessment   Upper Extremity Assessment Upper Extremity Assessment: Overall WFL for tasks assessed    Lower Extremity Assessment Lower Extremity Assessment:  (able to move BLE against gravity)       Communication   Communication Factors Affecting Communication: Trach/intubated    Cognition Arousal: Alert Behavior During Therapy: WFL for tasks  assessed/performed, Anxious   PT - Cognitive impairments: No apparent impairments                       PT - Cognition Comments: pt able to PLOF via mouthing words Following commands: Intact       Cueing Cueing Techniques: Verbal cues     General Comments      Exercises     Assessment/Plan    PT Assessment Patient does not need any further PT services  PT Problem List         PT Treatment Interventions      PT Goals (Current goals can be found in the Care Plan section)       Frequency       Co-evaluation               AM-PAC PT "6 Clicks" Mobility  Outcome Measure Help needed turning from your back to your side while in a flat bed without using bedrails?: A Lot Help needed moving from lying on your back to sitting on the side of a flat bed without using bedrails?: A Lot Help needed moving to and from a bed to a chair (including a wheelchair)?: A Lot Help needed standing up from a chair using your arms (e.g., wheelchair or bedside chair)?: A Lot Help needed to walk in hospital room?: Total Help needed climbing 3-5 steps with a railing? : Total 6 Click Score: 10    End of Session   Activity Tolerance: Other (comment) (pt anxious throughout) Patient left: in chair;with nursing/sitter in room;with call bell/phone within reach Nurse Communication: Mobility status PT Visit Diagnosis: Difficulty in walking, not elsewhere classified (R26.2)    Time: 1610-9604 PT Time Calculation (min) (ACUTE ONLY): 20 min   Charges:   PT Evaluation $PT Eval Moderate Complexity: 1 Mod PT Treatments $Therapeutic Activity: 8-22 mins PT General Charges $$ ACUTE PT VISIT: 1 Visit         Olga Coaster PT, DPT 12:55 PM,08/29/23

## 2023-08-29 NOTE — Progress Notes (Signed)
 NAME:  Michelle [name], MRN:  [id number], DOB:  1961/01/23, LOS: 1 ADMISSION DATE:  08/28/2023, CONSULTATION DATE:  08/28/23 REFERRING MD:  Dr. Scotty Court, CHIEF COMPLAINT:  Respiratory Failure, AECOPD   Brief Pt Description / Synopsis:  63 y.o. 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 To review, patient has had 4-5 days of SOB, wheezing, and productive green sputum, low grade fevers.    Upon arrival to the ER patient was on 6 liters [place], Temp 97.9, HR 152, RR 24, BP 181/158. [name] was exchanged and patient placed on the ventilator. Cultures sent, ceftriaxone, azithromycin, solumedrol, and duonebs given. Labs revealed WBC 15.6, Lactic acid 1.6, Procalcitonin 0.16, K 3.4, glucose 277, Viral panel negative.  CTA thorax negative for PE, mucus plugging of right middle lobe and bilateral lower lobe mucous plugging with consolidations, and emphysema. CT abd/pelvis severe atherosclerotic plaque of the aorta and its branches, no acute pathology noted.   PCCM asked to admit for further workup and treatment.  Please see "Significant Hospital Events" section below for full detailed hospital course.   Pertinent  Medical History  COPD/emphysema/asthma Chronic respiratory failure, trach placed 2019 HTN/HLD/CHF/CAD DM type 2 insulin dependent  Hep  C Prior Tobacco and cocaine abuse Anxiety/Depression  Micro Data:  3/26: COVID/FLU/RSV PCR>> negative 3/26: Blood culture x2>> no growth to date 3/27: Urine>> 3/27: MRSA PCR>> negative 3/27: RVP>> 3/27: Strep pneumo urinary antigen>> negative 3/27: Legionella urinary antigen>> 3/27: Tracheal aspirate>>  Antimicrobials:   Anti-infectives (From admission, onward)    Start     Dose/Rate Route Frequency Ordered Stop   08/29/23 2000  azithromycin (ZITHROMAX) 500 mg in sodium chloride 0.9 % 250 mL IVPB        500 mg 250 mL/hr over 60 Minutes Intravenous Every 24 hours 08/28/23 2223     08/29/23 2000  cefTRIAXone (ROCEPHIN) 2 g in sodium chloride 0.9 % 100 mL IVPB  Status:  Discontinued        2 g 200 mL/hr over 30 Minutes Intravenous Every 24 hours 08/28/23 2223 08/29/23 0224   08/29/23 0315  ceFEPIme (MAXIPIME) 2 g in sodium chloride 0.9 % 100 mL IVPB        2 g 200 mL/hr over 30 Minutes Intravenous Every 8 hours 08/29/23 0224     08/28/23 1900  cefTRIAXone (ROCEPHIN) 2 g in sodium chloride 0.9 % 100 mL IVPB        2 g 200 mL/hr over 30 Minutes Intravenous Once 08/28/23 1845 08/28/23 2000   08/28/23 1900  azithromycin (ZITHROMAX) 500 mg in sodium chloride 0.9 % 250 mL IVPB        500 mg 250 mL/hr over 60 Minutes Intravenous  Once 08/28/23 1845 08/28/23 2107       Significant Hospital Events: Including procedures, antibiotic start and stop dates in addition to other pertinent events   3/26: Admit to ICU on home ventilator with  acute on chronic respiratory failure. Cultures pending, broad spectrum antibiotics.  3/27: On minimal vent settings, with intermittent anxiety requesting to resume her home Klonopin.  Discussed with Dr. Belia Heman, will allow her to start diet.  Cultures still pending, continue broad spectrum antibiotics.   Interim History / Subjective:  As outlined above under significant hospital events section  Objective   Blood pressure (!) 161/80, pulse 91, temperature  98.9 F (37.2 C), temperature source Oral, resp. rate 17, height 5' (1.524 m), weight 75.7 kg, last menstrual period 03/06/2005, SpO2 100%.    Vent Mode: PRVC FiO2 (%):  [40 %-50 %] 40 % Set Rate:  [16 bmp] 16 bmp Vt Set:  [400 mL] 400 mL PEEP:  [5 cmH20] 5 cmH20   Intake/Output Summary (Last 24 hours) at 08/29/2023 0950 Last data filed at 08/29/2023 3244 Gross per 24 hour  Intake 848.82 ml  Output 850 ml  Net -1.18 ml   Filed Weights   08/28/23 1922 08/29/23 0403  Weight: 74.9 kg 75.7 kg    Examination: General: Acute on chronically ill appearing obese female, sitting in bed, on mechanical ventilation via chronic trach, no acute distress HENT: Atraumatic, normocephalic, neck supple, difficult to assess JVD due to body habitus Lungs: Coarse breath sounds with mild expiratory wheezing throughout, even, nonlabored Cardiovascular: Regular rate and rhythm, S1-S2, no murmurs, rubs, gallops Abdomen: Obese, soft, nontender, nondistended, no guarding or rebound tenderness, bowel sounds positive x 4 Extremities: Normal bulk and tone, no deformities, no edema, no cyanosis Neuro: Awake and alert, oriented x 4, moves all extremities to commands, no focal deficits GU: External female catheter in place  Resolved Hospital Problem list     Assessment & Plan:   #Acute on Chronic Hypoxic & Hypercapnic Respiratory failure in the setting of ... #Acute COPD Exacerbation  #Suspected Pneumonia #Mucus Plugging PMHx: COPD/Emphysema/Asthma with chronic tracheostomy and home vent. Pseudomonas and Acinetobacter PNA  -Full vent support, implement lung protective strategies -Plateau pressures less than 30 cm H20 -Wean FiO2 & PEEP as tolerated to maintain O2 sats 88 to 92% -Follow intermittent Chest X-ray & ABG as needed -Spontaneous Breathing Trials when respiratory parameters met and mental status permits -Implement VAP Bundle -Bronchodilators, Pulmicort nebs, and Hypertonic saline nebs  -IV  steroids (decrease to 20 mg BID on 3/27) -ABX as above  #Suspected Pneumonia #UTI (? Colonization) -Monitor fever curve -Trend WBC's  -Follow cultures as above -Continue empiric Azithromycin, Cefepime pending cultures & sensitivities  #Anxiety/Depression -Prn Valium -Resume outpatient Klonopin    #Type 2 Diabetes Mellitus, insulin dependent -CBG's q4h; Target range of 140 to 180 -SSI -Follow ICU Hypo/Hyperglycemia protocol   #Hypertension Hx: Hyperlipidemia, CAD, CHF -Cardiac monitoring -MAP goal > 65, SBP < 160 -Continue home medications once evaluated by SLP      Best Practice (right click and "Reselect all SmartList Selections" daily)   Diet/type: Regular consistency (see orders) DVT prophylaxis: LMWH GI prophylaxis: H2B Lines: N/A Foley:  N/A Code Status:  full code Last date of multidisciplinary goals of care discussion [3/27]  3/27: Pt updated at bedside on plan of care.  Labs   CBC: Recent Labs  Lab 08/28/23 1846 08/29/23 0101 08/29/23 0307  WBC 15.6* 14.6* 14.2*  NEUTROABS 10.4*  --   --   HGB 13.0 11.5* 11.2*  HCT 41.7 36.3 36.1  MCV 83.9 84.0 84.1  PLT 332 257 270    Basic Metabolic Panel: Recent Labs  Lab 08/28/23 1846 08/29/23 0101 08/29/23 0307  NA 139  138 136  K 3.4* 4.4 4.6  CL 103 107 103  CO2 24 23 23   GLUCOSE 277* 342* 344*  BUN 11 10 10   CREATININE 0.65 0.64 0.71  CALCIUM 9.4 8.2* 8.3*  MG  --  2.0 2.0  PHOS  --  3.2 2.9   GFR: Estimated Creatinine Clearance: 66.3 mL/min (by C-G formula based on SCr of 0.71 mg/dL). Recent Labs  Lab 08/28/23 1846 08/29/23 0101 08/29/23 0307  PROCALCITON 0.16  --   --   WBC 15.6* 14.6* 14.2*  LATICACIDVEN 1.6 1.7  --     Liver Function Tests: Recent Labs  Lab 08/28/23 1846  AST 23  ALT 19  ALKPHOS 94  BILITOT 0.6  PROT 8.4*  ALBUMIN 4.2   No results for input(s): "LIPASE", "AMYLASE" in the last 168 hours. No results for input(s): "AMMONIA" in the last 168 hours.  ABG     Component Value Date/Time   PHART 7.31 (L) 09/30/2021 0500   PCO2ART 46 09/30/2021 0500   PO2ART 155 (H) 09/30/2021 0500   HCO3 24.8 08/29/2023 0307   ACIDBASEDEF 1.5 08/29/2023 0307   O2SAT 97.5 08/29/2023 0307     Coagulation Profile: Recent Labs  Lab 08/28/23 1846  INR 1.0    Cardiac Enzymes: No results for input(s): "CKTOTAL", "CKMB", "CKMBINDEX", "TROPONINI" in the last 168 hours.  HbA1C: Hemoglobin A1C  Date/Time Value Ref Range Status  06/29/2013 04:02 AM 6.0 4.2 - 6.3 % Final    Comment:    The American Diabetes Association recommends that a primary goal of therapy should be <7% and that physicians should reevaluate the treatment regimen in patients with HbA1c values consistently >8%.    Hgb A1c MFr Bld  Date/Time Value Ref Range Status  08/29/2023 01:01 AM 9.4 (H) 4.8 - 5.6 % Final    Comment:    (NOTE) Pre diabetes:          5.7%-6.4%  Diabetes:              >6.4%  Glycemic control for   <7.0% adults with diabetes   02/19/2023 04:33 AM 10.3 (H) 4.8 - 5.6 % Final    Comment:    (NOTE)         Prediabetes: 5.7 - 6.4         Diabetes: >6.4         Glycemic control for adults with diabetes: <7.0     CBG: Recent Labs  Lab 08/29/23 0007 08/29/23 0357 08/29/23 0729  GLUCAP 305* 274* 259*    Review of Systems:   Positives in BOLD: Gen: Denies fever, chills, weight change, fatigue, night sweats HEENT: Denies blurred vision, double vision, hearing loss, tinnitus, sinus congestion, rhinorrhea, sore throat, neck stiffness, dysphagia PULM: Denies shortness of breath, cough, sputum production, hemoptysis, wheezing CV: Denies chest pain, edema, orthopnea, paroxysmal nocturnal dyspnea, palpitations GI: Denies abdominal pain, nausea, vomiting, diarrhea, hematochezia, melena, constipation, change in bowel habits GU: Denies dysuria, hematuria, polyuria, oliguria, urethral discharge Endocrine: Denies hot or cold intolerance, polyuria, polyphagia or appetite  change Derm: Denies rash, dry skin, scaling or peeling skin change Heme: Denies easy bruising, bleeding, bleeding gums Neuro: Denies headache, numbness, weakness, slurred speech, loss of memory or consciousness   Past Medical History:  She,  has a past medical history of Allergy, Anxiety, Asthma, CHF (congestive heart failure) (HCC), Cocaine abuse (HCC), COPD (chronic obstructive pulmonary disease) (HCC), Coronary artery disease, Emphysema/COPD (HCC), Hepatitis C, High cholesterol, Hypertension, Pneumothorax, and Tobacco abuse.  Surgical History:   Past Surgical History:  Procedure Laterality Date   CARDIAC CATHETERIZATION     CHEST TUBE INSERTION     IR GASTROSTOMY TUBE MOD SED  10/31/2017   TRACHEOSTOMY TUBE PLACEMENT N/A 10/17/2017   Procedure: TRACHEOSTOMY;  Surgeon: Bud Face, MD;  Location: [facility] ORS;  Service: ENT;  Laterality: N/A;     Social History:   reports that she quit smoking about 6 years ago. Her smoking use included cigarettes. She started smoking about 47 years ago. She has a 4.1 pack-year smoking history. She has never used smokeless tobacco. She reports that she does not currently use drugs after having used the following drugs: "Crack" cocaine. She reports that she does not drink alcohol.   Family History:  Her family history includes Asthma in her mother; Breast cancer (age of onset: 82) in her maternal aunt; CAD in her father; COPD in her sister; CVA in her father; Lung cancer in her mother; Stroke in her father.   Allergies Allergies  Allergen Reactions   Amoxicillin     Patient has tolerated cephalosporins in the past   Ativan [Lorazepam]     Makes anxiety worse      Home Medications  Prior to Admission medications   Medication Sig Start Date End Date Taking? Authorizing Provider  acetaminophen (TYLENOL) 325 MG tablet Take 2 tablets (650 mg total) by mouth every 6 (six) hours as needed for mild pain (pain score 1-3) (or Fever >/= 101). 04/24/23    Loyce Dys, MD  albuterol (PROVENTIL) (2.5 MG/3ML) 0.083% nebulizer solution Take 3 mLs (2.5 mg total) by nebulization every 6 (six) hours as needed for wheezing or shortness of breath. 03/27/23   Salena Saner, MD  albuterol (VENTOLIN HFA) 108 (90 Base) MCG/ACT inhaler Inhale 2 puffs into the lungs every 6 (six) hours as needed for wheezing or shortness of breath. 04/02/22   Parrett, Virgel Bouquet, NP  amLODipine (NORVASC) 5 MG tablet Take 5 mg by mouth daily. 08/27/21   [provider]  aspirin 81 MG chewable tablet Chew 81 mg by mouth daily.     [provider]  azelastine (ASTELIN) 0.1 % nasal spray SMARTSIG:1-2 Spray(s) Both Nares Every 12 Hours PRN 09/22/20   [provider]  azithromycin (ZITHROMAX Z-PAK) 250 MG tablet Take 2 tablets on Day 1 and then 1 tablet daily till gone. 06/17/23   Erin Fulling, MD  benzonatate (TESSALON PERLES) 100 MG capsule Take 1-2 capsules (100-200 mg total) by mouth 3 (three) times daily as needed for cough. 02/22/23   Sunnie Nielsen, DO  Blood Glucose Monitoring Suppl ([facility] BLOOD GLUCOSE METER) w/Device KIT Use as directed to monitor blood glucose up to qid 02/22/23   Sunnie Nielsen, DO  cetirizine (ZYRTEC) 10 MG tablet Take 10 mg by mouth daily. 03/21/22   [provider]  clonazePAM (KLONOPIN) 0.5 MG tablet Take 0.5 mg by mouth in the morning, at noon, and at bedtime.    [provider]  cloNIDine (CATAPRES - DOSED IN MG/24 HR) 0.1 mg/24hr patch Place 0.1 mg onto the skin once a week.    [provider]  D3-50 1.25 MG (50000 UT) capsule Take 50,000 Units by mouth once a week. 12/28/22   [provider]  docusate sodium (COLACE) 100 MG capsule Take 2 capsules (200 mg total) by mouth daily as needed for mild constipation. 04/24/23   Loyce Dys, MD  fluticasone (FLONASE) 50 MCG/ACT nasal spray Place 1 spray  into both nostrils daily.    [provider]  guaiFENesin (MUCINEX) 600 MG 12 hr  tablet Take 600 mg by mouth 2 (two) times daily.    [provider]  hydrOXYzine (VISTARIL) 25 MG capsule Take 25 mg by mouth every 6 (six) hours as needed. 10/01/22   [provider]  ipratropium-albuterol (DUONEB) 0.5-2.5 (3) MG/3ML SOLN Take 3 mLs by nebulization every 6 (six) hours as needed (for shortness of breath). 06/17/23   Erin Fulling, MD  LANTUS SOLOSTAR 100 UNIT/ML Solostar Pen Inject 10 Units into the skin at bedtime. 01/03/23   [provider]  levofloxacin (LEVAQUIN) 750 MG tablet Take 1 tablet (750 mg total) by mouth daily. 08/28/23   Erin Fulling, MD  mirtazapine (REMERON) 30 MG tablet Take 30 mg by mouth at bedtime. 01/19/21   [provider]  mupirocin ointment (BACTROBAN) 2 % Apply 1 Application topically daily. To inside nose and to affected area of axilla 05/21/22   Moye, [place], MD  naloxone [facility]) nasal spray 4 mg/0.1 mL Place 1 spray into the nose once. 10/22/22   [provider]  NOVOLOG FLEXPEN 100 UNIT/ML FlexPen Inject into the skin 3 (three) times daily with meals. Sliding scale mealtime insulin. 03/01/21   [provider]  nystatin ointment (MYCOSTATIN) Apply 1 Application topically 3 (three) times daily. 01/04/23   [provider]  oxyCODONE-acetaminophen (PERCOCET/ROXICET) 5-325 MG tablet Take 1 tablet by mouth 3 (three) times daily as needed for pain. 09/07/21   [provider]  PARoxetine (PAXIL) 40 MG tablet Take 40 mg by mouth every morning. Give 30 mg x 1 week (1.5 tab of 20 mg) then start 40 mg daily. D/c 20 mg pills at that time.    [provider]  polyethylene glycol powder (GLYCOLAX/MIRALAX) 17 GM/SCOOP powder Take by mouth.    [provider]  predniSONE (DELTASONE) 20 MG tablet Take 1 tablet (20 mg total) by mouth daily with breakfast. 7 days 06/17/23   Erin Fulling, MD  predniSONE (DELTASONE) 20 MG tablet Take 1 tablet (20 mg total) by mouth daily with breakfast for 10 days.  10 days 08/28/23 09/07/23  Erin Fulling, MD  PULMICORT 0.5 MG/2ML nebulizer solution  02/13/21   [provider]  terbinafine (LAMISIL) 1 % cream Apply 1 application  topically 2 (two) times daily. To affected skin, for 1 week after resolution of rash. # 30 gm, 1 refill.    [provider]  TRULICITY 4.5 MG/0.5ML SOPN Inject 4.5 mg into the skin once a week. 09/28/21   [provider]  Vitamin D, Ergocalciferol, (DRISDOL) 1.25 MG (50000 UNIT) CAPS capsule Take 50,000 Units by mouth 2 (two) times a week.    [provider]  fluconazole (DIFLUCAN) 150 MG tablet Take 1 tablet day 1 repeat in 1 week 11/15/20   Neale Burly, [place], MD     Critical care time: 40 minutes     Harlon Ditty, AGACNP-BC [facility] Prefer epic messenger for cross cover needs If after hours, please call E-link

## 2023-08-29 NOTE — Progress Notes (Signed)
 eLink Physician-Brief Progress Note Patient Name: Michelle [name] DOB: 11-10-1960 MRN: [id number]   Date of Service  08/29/2023  HPI/Events of Note  Patient admitted with acute on chronic hypoxemic / hypercapnic respiratory failure secondary to acute exacerbation of COPD and possible pneumonia, work up is in progress.  eICU Interventions  New Patient Evaluation.        [name] [name] 08/29/2023, 1:18 AM

## 2023-08-29 NOTE — TOC Initial Note (Signed)
 Transition of Care [facility]) - Initial/Assessment Note    Patient Details  Name: Michelle [name] MRN: [id number] Date of Birth: Jan 17, 1961  Transition of [facility]) CM/SW Contact:    Garret Reddish, RN Phone Number: 08/29/2023, 11:52 AM  Clinical Narrative:                 Chart reviewed.  Noted that patient was admitted with Acute on Chronic Respiratory failure with hypoxia.  Noted that patient is a from home.  Patient is a home vent/trach patient.    I have meet with patient at bedside.  She informs me that she lives at home with her daughter.  I have spoken with patient's daughter [place].  Raoul Pitch reports that patient's home vent and trach supplies are provided through [employer].  Raoul Pitch reports that patient has a manual wheelchair, walker, rollator, BSC and holter lift at home.  Raoul Pitch informs me that patient receives 16 hours  a day for private duty nursing.  Patient uses [employer] and [employer] agencies for private duty nursing.  The agencies assist with her bathing/dressing and her trach /vent care.    Patient receives PCP services through [facility].  Raoul Pitch reports that some times she is able to transport patient ot provider appointments.  She informs me that patient's vent is portable and has a 13 hour battery life.    Raoul Pitch reports that on discharge patient will need to be transported via EMS.    TOC will continue to follow for discharge planning.        Expected Discharge Plan: Home/Self Care Barriers to Discharge: No Barriers Identified   Patient Goals and CMS Choice            Expected Discharge Plan and Services   Discharge Planning Services: CM Consult Post Acute Care Choice:  (Patient has Private duty nursing) Living arrangements for the past 2 months: Apartment                   DME Agency:  (Patient has home vent and trach supplies via [employer])                  Prior Living Arrangements/Services Living arrangements for the past 2 months: Apartment Lives  with:: Adult Children Patient language and need for interpreter reviewed:: Yes Do you feel safe going back to the place where you live?: Yes        Care giver support system in place?: Yes (comment) (Patient has a supportive daughter) Current home services: Other (comment) (Patient has private duty nursing via [employer] and [employer] private duty nursing)    Activities of Daily Living      Permission Sought/Granted                  Emotional Assessment Appearance:: Appears stated age Attitude/Demeanor/Rapport: Engaged Affect (typically observed): Appropriate Orientation: : Oriented to Self, Oriented to Place, Oriented to  Time, Oriented to Situation      Admission diagnosis:  Ventilator dependent (HCC) [Z99.11] COPD exacerbation (HCC) [J44.1] Tracheostomy in place [facility]) [Z93.0] Acute on chronic respiratory failure with hypoxia (HCC) [J96.21] Patient Active Problem List   Diagnosis Date Noted   Sepsis due to pneumonia (HCC) 04/19/2023   Essential hypertension 04/19/2023   Chronic obstructive pulmonary disease (COPD) (HCC) 04/19/2023   Anxiety and depression 04/19/2023   Type 2 diabetes mellitus without complications (HCC) 04/19/2023   Abnormal CT scan 04/19/2023   Obesity (BMI 30-39.9) 02/19/2023   Dysuria 02/18/2023  Elevated troponin 02/18/2023   COVID-19 virus infection 02/18/2023   Tracheostomy status (HCC) 04/02/2022   Acute on chronic respiratory failure (HCC) 09/30/2021   Urine retention 02/07/2021   Candidiasis of breast 11/15/2020   Atypical pneumonia 08/29/2019   HCAP (healthcare-associated pneumonia) 08/28/2019   Pseudomonas respiratory infection 07/03/2019   Depression with anxiety 07/03/2019   Hyperlipidemia associated with type 2 diabetes mellitus (HCC) 07/03/2019   PEG (percutaneous endoscopic gastrostomy) status (HCC) 07/03/2019   Chronic pain 07/03/2019   Hypokalemia 07/03/2019   DM (diabetes mellitus) (HCC) 06/24/2019   Chronic respiratory failure  requiring continuous mechanical ventilation through tracheostomy (HCC) 10/20/2018   CAD (coronary artery disease) 10/20/2018   HTN (hypertension) 10/20/2018   Chronic diastolic CHF (congestive heart failure) (HCC) 10/20/2018   Pneumonia 09/17/2018   Panic disorder 10/29/2017   Altered mental status    Pressure injury of skin 10/23/2017   Acute on chronic respiratory failure with hypoxia and hypercapnia (HCC) 06/29/2017   Acute on chronic respiratory failure with hypoxia (HCC) 02/23/2017   Protein-calorie malnutrition, severe 05/23/2016   Sepsis (HCC) 05/22/2016   Cocaine abuse (HCC) 04/07/2015   Malnutrition of moderate degree (HCC) 10/25/2014   COPD (chronic obstructive pulmonary disease) (HCC) 10/24/2014   Tobacco abuse 10/24/2014   Community acquired pneumonia 10/24/2014   PCP:  Abram Sander, MD Pharmacy:   [facility] - [place], [place] - [address] [place] [address] Phone: [phone] Fax: [phone]  [facility] [address] [place] - [address] [place] [address] Phone: [phone] Fax: [phone]  [id number] [place], [place] - [address] [place] [address] Phone: [phone] Fax: [phone]     Social Drivers of Health (SDOH) Social History: SDOH Screenings   Food Insecurity: Patient Unable To Answer (04/19/2023)  Recent Concern: Food Insecurity - Food Insecurity Present (02/18/2023)  Housing: Patient Unable To Answer (04/19/2023)  Transportation Needs: Patient Unable To Answer (04/19/2023)  Utilities: Patient Unable To Answer (04/19/2023)  Financial Resource Strain: Medium Risk (06/29/2017)  Physical Activity: Inactive (06/29/2017)  Social Connections: Unknown (06/29/2017)  Stress: Stress Concern Present (06/29/2017)  Tobacco Use: Medium Risk (08/28/2023)   SDOH Interventions:     Readmission Risk Interventions    04/19/2023    2:10 PM 02/22/2023    1:47 PM 10/03/2021     3:58 PM  Readmission Risk Prevention Plan  Transportation Screening Complete Complete Complete  PCP or Specialist Appt within 3-5 Days Complete Complete Complete  HRI or Home Care Consult Complete Complete   Social Work Consult for Recovery Care Planning/Counseling Complete Complete Complete  Palliative Care Screening Complete Complete Not Applicable  Medication Review Oceanographer) Complete Complete Complete

## 2023-08-29 NOTE — Consult Note (Signed)
 PHARMACY CONSULT NOTE - ELECTROLYTES  Pharmacy Consult for Electrolyte Monitoring and Replacement   Recent Labs: Height: 5' (152.4 cm) Weight: 75.7 kg (166 lb 14.2 oz) IBW/kg (Calculated) : 45.5 Estimated Creatinine Clearance: 66.3 mL/min (by C-G formula based on SCr of 0.71 mg/dL). Potassium (mmol/L)  Date Value  08/29/2023 4.6  07/11/2014 4.6   Magnesium (mg/dL)  Date Value  16/03/9603 2.0  10/31/2013 2.6 (H)   Calcium (mg/dL)  Date Value  54/02/8118 8.3 (L)   Calcium, Total (mg/dL)  Date Value  14/78/2956 8.9   Albumin (g/dL)  Date Value  21/30/8657 4.2  07/09/2014 3.7   Phosphorus (mg/dL)  Date Value  84/69/6295 2.9  10/31/2013 4.6   Sodium (mmol/L)  Date Value  08/29/2023 136  07/11/2014 139    Assessment  Michelle [name] is a 63 y.o. female presenting with SOB, wheezing, and productive green sputum. Pharmacy has been consulted to monitor and replace electrolytes while under CCM care.  Diet: NPO  Goal of Therapy: Electrolytes WNL  Plan:  No replacement currently indicated Check BMP, Mg, Phos with AM labs  Thank you for allowing pharmacy to be a part of this patient's care.  Bettey Costa, PharmD Clinical Pharmacist 08/29/2023 7:26 AM

## 2023-08-29 NOTE — Progress Notes (Signed)
 SLP Cancellation Note  Patient Details Name: Michelle [name] MRN: [id number] DOB: 06-28-1960   Cancelled treatment:       Reason Eval/Treat Not Completed: Other (comment)  This writer has corresponding x 2 with attending regarding order for bedside swallow evaluation. Per attending, ok to discharge order as pt consumes PO intake while on vent chronically. ST services not indicated.   Kynedi Profitt B. Dreama Saa, M.S., CCC-SLP, CBIS Speech-Language Pathologist Certified Brain Injury Specialist [facility] [phone] Ascom [phone] Fax [phone]  Sahiba Granholm Dreama Saa 08/29/2023, 11:28 AM

## 2023-08-29 NOTE — Progress Notes (Signed)
 Pt transported to ICU 1 on the vent without incident. Pt remains on the vent. Report given to ICU RT.

## 2023-08-29 NOTE — Plan of Care (Signed)
  Problem: Activity: Goal: Ability to tolerate increased activity will improve Outcome: Progressing   Problem: Respiratory: Goal: Ability to maintain a clear airway and adequate ventilation will improve Outcome: Progressing   Problem: Role Relationship: Goal: Method of communication will improve Outcome: Progressing   Problem: Education: Goal: Knowledge of General Education information will improve Description: Including pain rating scale, medication(s)/side effects and non-pharmacologic comfort measures Outcome: Progressing   Problem: Health Behavior/Discharge Planning: Goal: Ability to manage health-related needs will improve Outcome: Progressing   Problem: Health Behavior/Discharge Planning: Goal: Ability to manage health-related needs will improve Outcome: Progressing   Problem: Clinical Measurements: Goal: Will remain free from infection Outcome: Progressing

## 2023-08-29 NOTE — Evaluation (Signed)
 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   Clinical Impressions Michelle [name] was seen for OT evaluation this date. Prior to hospital admission, pt required assist for w/c transfers and assist for bed level toileting/dressing. Pt lives with daughter, reports PCA assist for ADLs. Pt currently requires MOD A for chair>bed t/f. Fait static sitting balance. MIN A bed mobility, assist for lines mgmt. Pt appears near baseline however would benefit from skilled acute OT services to prevent deconditioning and encourage OOB. Upon hospital discharge, recommend BSC and no OT follow up.     If plan is discharge home, recommend the following:   A lot of help with walking and/or transfers;A lot of help with bathing/dressing/bathroom     Functional Status Assessment   Patient has not had a recent decline in their functional status     Equipment Recommendations   Wheelchair cushion (measurements OT);BSC/3in1     Recommendations for Other Services         Precautions/Restrictions   Precautions Precautions: Fall Restrictions Weight Bearing Restrictions Per Provider Order: No     Mobility Bed Mobility Overal bed mobility: Needs Assistance Bed Mobility: Sit to Supine       Sit to supine: Min assist        Transfers Overall transfer level: Needs assistance Equipment used: 1 person hand held assist Transfers: Bed to chair/wheelchair/BSC     Squat pivot transfers: Mod assist              Balance  Overall balance assessment: Needs assistance Sitting-balance support: Feet supported Sitting balance-Leahy Scale: Fair     Standing balance support: Bilateral upper extremity supported Standing balance-Leahy Scale: Poor                             ADL either performed or assessed with clinical judgement   ADL Overall ADL's : Needs assistance/impaired                                       General ADL Comments: MOD A for simulated BSC t/f. SBA seated grooming tasks      Pertinent Vitals/Pain Pain Assessment Pain Assessment: Faces Faces Pain Scale: No hurt     Extremity/Trunk Assessment Upper Extremity Assessment Upper Extremity Assessment: Overall WFL for tasks assessed   Lower Extremity Assessment Lower Extremity Assessment: Generalized weakness       Communication Communication Factors Affecting Communication: Trach/intubated   Cognition Arousal: Alert Behavior During Therapy: WFL for tasks assessed/performed, Anxious Cognition: No apparent impairments                               Following commands: Intact       Cueing  General Comments   Cueing Techniques: Verbal cues      Exercises     Shoulder Instructions      Home Living Family/patient expects to  be discharged to:: Private residence Living Arrangements: Children Available Help at Discharge: Family;Available 24 hours/day Type of Home: Mobile home Home Access: Ramped entrance     Home Layout: One level     Bathroom Shower/Tub: Tub/shower unit         Home Equipment: Wheelchair - manual          Prior Functioning/Environment Prior Level of Function : Needs assist       Physical Assist : Mobility (physical);ADLs (physical) Mobility (physical): Bed mobility;Transfers ADLs (physical): Bathing;Dressing;Toileting;IADLs;Grooming Mobility Comments: stand pivot transfer to [facility], every other day. daughter assists with bed mobility as well ADLs  Comments: dtr assists with bed pan and dressing    OT Problem List: Decreased activity tolerance;Decreased strength   OT Treatment/Interventions: Self-care/ADL training;Therapeutic exercise;Energy conservation;DME and/or AE instruction;Therapeutic activities      OT Goals(Current goals can be found in the care plan section)   Acute Rehab OT Goals Patient Stated Goal: to go home OT Goal Formulation: With patient Time For Goal Achievement: 09/12/23 Potential to Achieve Goals: Good ADL Goals Pt Will Perform Grooming: with set-up;with supervision;sitting Pt Will Transfer to Toilet: with min assist;stand pivot transfer;bedside commode   OT Frequency:  Min 2X/week    Co-evaluation              AM-PAC OT "6 Clicks" Daily Activity     Outcome Measure Help from another person eating meals?: None Help from another person taking care of personal grooming?: A Little Help from another person toileting, which includes using toliet, bedpan, or urinal?: A Lot Help from another person bathing (including washing, rinsing, drying)?: A Lot Help from another person to put on and taking off regular upper body clothing?: A Little Help from another person to put on and taking off regular lower body clothing?: A Lot 6 Click Score: 16   End of Session    Activity Tolerance: Patient tolerated treatment well Patient left: in bed;with call bell/phone within reach;with nursing/sitter in room  OT Visit Diagnosis: Other abnormalities of gait and mobility (R26.89);Muscle weakness (generalized) (M62.81)                Time: 5784-6962 OT Time Calculation (min): 20 min Charges:  OT General Charges $OT Visit: 1 Visit OT Evaluation $OT Eval Moderate Complexity: 1 Mod  Kathie Dike, M.S. OTR/L  08/29/23, 2:37 PM  ascom [phone]

## 2023-08-29 NOTE — Progress Notes (Signed)
 Anticoagulation monitoring(Lovenox):  63 yo female ordered Lovenox 40 mg Q24h    Filed Weights   08/28/23 1922  Weight: 74.9 kg (165 lb 2 oz)   BMI 32.3    Lab Results  Component Value Date   CREATININE 0.65 08/28/2023   CREATININE 0.70 04/24/2023   CREATININE 0.60 04/23/2023   Estimated Creatinine Clearance: 66 mL/min (by C-G formula based on SCr of 0.65 mg/dL). Hemoglobin & Hematocrit     Component Value Date/Time   HGB 13.0 08/28/2023 1846   HGB 12.4 07/11/2014 0434   HCT 41.7 08/28/2023 1846   HCT 38.9 07/11/2014 0434     Per Protocol for Patient with estCrcl > 30 ml/min and BMI > 30, will transition to Lovenox 37.5 mg Q24h.

## 2023-08-30 DIAGNOSIS — J9621 Acute and chronic respiratory failure with hypoxia: Secondary | ICD-10-CM | POA: Diagnosis not present

## 2023-08-30 DIAGNOSIS — J441 Chronic obstructive pulmonary disease with (acute) exacerbation: Secondary | ICD-10-CM | POA: Diagnosis not present

## 2023-08-30 DIAGNOSIS — I10 Essential (primary) hypertension: Secondary | ICD-10-CM | POA: Diagnosis not present

## 2023-08-30 DIAGNOSIS — J9622 Acute and chronic respiratory failure with hypercapnia: Secondary | ICD-10-CM | POA: Diagnosis not present

## 2023-08-30 LAB — CBC
HCT: 38.7 % (ref 36.0–46.0)
Hemoglobin: 11.9 g/dL — ABNORMAL LOW (ref 12.0–15.0)
MCH: 26.3 pg (ref 26.0–34.0)
MCHC: 30.7 g/dL (ref 30.0–36.0)
MCV: 85.4 fL (ref 80.0–100.0)
Platelets: 312 10*3/uL (ref 150–400)
RBC: 4.53 MIL/uL (ref 3.87–5.11)
RDW: 14.8 % (ref 11.5–15.5)
WBC: 15 10*3/uL — ABNORMAL HIGH (ref 4.0–10.5)
nRBC: 0 % (ref 0.0–0.2)

## 2023-08-30 LAB — GLUCOSE, CAPILLARY
Glucose-Capillary: 160 mg/dL — ABNORMAL HIGH (ref 70–99)
Glucose-Capillary: 175 mg/dL — ABNORMAL HIGH (ref 70–99)
Glucose-Capillary: 196 mg/dL — ABNORMAL HIGH (ref 70–99)
Glucose-Capillary: 196 mg/dL — ABNORMAL HIGH (ref 70–99)
Glucose-Capillary: 221 mg/dL — ABNORMAL HIGH (ref 70–99)
Glucose-Capillary: 251 mg/dL — ABNORMAL HIGH (ref 70–99)

## 2023-08-30 LAB — BASIC METABOLIC PANEL WITH GFR
Anion gap: 11 (ref 5–15)
BUN: 13 mg/dL (ref 8–23)
CO2: 24 mmol/L (ref 22–32)
Calcium: 9.4 mg/dL (ref 8.9–10.3)
Chloride: 106 mmol/L (ref 98–111)
Creatinine, Ser: 0.61 mg/dL (ref 0.44–1.00)
GFR, Estimated: >60 mL/min (ref >60)
Glucose, Bld: 252 mg/dL — ABNORMAL HIGH (ref 70–99)
Potassium: 4.8 mmol/L (ref 3.5–5.1)
Sodium: 141 mmol/L (ref 135–145)

## 2023-08-30 LAB — PHOSPHORUS: Phosphorus: 2.8 mg/dL (ref 2.5–4.6)

## 2023-08-30 LAB — MAGNESIUM: Magnesium: 2.2 mg/dL (ref 1.7–2.4)

## 2023-08-30 MED ORDER — OXYCODONE-ACETAMINOPHEN 5-325 MG PO TABS
1.0000 | ORAL_TABLET | Freq: Four times a day (QID) | ORAL | Status: DC | PRN
  Administered 2023-08-30 – 2023-09-05 (×10): 1 via ORAL
  Filled 2023-08-30 (×10): qty 1

## 2023-08-30 MED ORDER — INSULIN GLARGINE-YFGN 100 UNIT/ML ~~LOC~~ SOLN
15.0000 [IU] | Freq: Every day | SUBCUTANEOUS | Status: DC
  Administered 2023-08-30: 15 [IU] via SUBCUTANEOUS
  Filled 2023-08-30: qty 0.15

## 2023-08-30 MED ORDER — INSULIN ASPART 100 UNIT/ML IJ SOLN
2.0000 [IU] | INTRAMUSCULAR | Status: DC
  Administered 2023-08-30 – 2023-08-31 (×3): 2 [IU] via SUBCUTANEOUS
  Filled 2023-08-30 (×3): qty 1

## 2023-08-30 MED ORDER — INSULIN GLARGINE-YFGN 100 UNIT/ML ~~LOC~~ SOLN
18.0000 [IU] | Freq: Every day | SUBCUTANEOUS | Status: DC
  Administered 2023-08-31 – 2023-09-02 (×3): 18 [IU] via SUBCUTANEOUS
  Filled 2023-08-30 (×4): qty 0.18

## 2023-08-30 MED ORDER — INSULIN ASPART 100 UNIT/ML IJ SOLN
4.0000 [IU] | Freq: Once | INTRAMUSCULAR | Status: AC
Start: 2023-08-30 — End: 2023-08-30
  Administered 2023-08-30: 4 [IU] via SUBCUTANEOUS
  Filled 2023-08-30: qty 1

## 2023-08-30 MED ORDER — MELATONIN 5 MG PO TABS
5.0000 mg | ORAL_TABLET | Freq: Every evening | ORAL | Status: DC | PRN
Start: 2023-08-30 — End: 2023-09-05
  Administered 2023-08-30 – 2023-09-04 (×5): 5 mg via ORAL
  Filled 2023-08-30 (×5): qty 1

## 2023-08-30 MED ORDER — AMLODIPINE BESYLATE 5 MG PO TABS
5.0000 mg | ORAL_TABLET | Freq: Every day | ORAL | Status: DC
  Administered 2023-08-30 – 2023-09-05 (×7): 5 mg via ORAL
  Filled 2023-08-30 (×7): qty 1

## 2023-08-30 NOTE — Plan of Care (Signed)
  Problem: Health Behavior/Discharge Planning: Goal: Ability to manage health-related needs will improve Outcome: Progressing   Problem: Nutrition: Goal: Adequate nutrition will be maintained Outcome: Progressing   Problem: Safety: Goal: Ability to remain free from injury will improve Outcome: Progressing   Problem: Skin Integrity: Goal: Risk for impaired skin integrity will decrease Outcome: Progressing   Problem: Skin Integrity: Goal: Risk for impaired skin integrity will decrease Outcome: Progressing   Problem: Coping: Goal: Level of anxiety will decrease Outcome: Not Progressing

## 2023-08-30 NOTE — Consult Note (Signed)
 PHARMACY CONSULT NOTE - ELECTROLYTES  Pharmacy Consult for Electrolyte Monitoring and Replacement   Recent Labs: Height: 5' (152.4 cm) Weight: 76.4 kg (168 lb 6.9 oz) IBW/kg (Calculated) : 45.5 Estimated Creatinine Clearance: 66.6 mL/min (by C-G formula based on SCr of 0.61 mg/dL). Potassium (mmol/L)  Date Value  08/30/2023 4.8  07/11/2014 4.6   Magnesium (mg/dL)  Date Value  16/03/9603 2.2  10/31/2013 2.6 (H)   Calcium (mg/dL)  Date Value  54/02/8118 9.4   Calcium, Total (mg/dL)  Date Value  14/78/2956 8.9   Albumin (g/dL)  Date Value  21/30/8657 4.2  07/09/2014 3.7   Phosphorus (mg/dL)  Date Value  84/69/6295 2.8  10/31/2013 4.6   Sodium (mmol/L)  Date Value  08/30/2023 141  07/11/2014 139    Assessment  Michelle [name] is a 63 y.o. female presenting with SOB, wheezing, and productive green sputum. Pharmacy has been consulted to monitor and replace electrolytes while under CCM care.  Diet: NPO  Goal of Therapy: Electrolytes WNL  Plan:  No replacement currently indicated Check BMP, Mg, Phos with AM labs  Thank you for allowing pharmacy to be a part of this patient's care.  Bettey Costa, PharmD Clinical Pharmacist 08/30/2023 7:22 AM

## 2023-08-30 NOTE — Progress Notes (Signed)
 NAME:  Michelle [name], MRN:  [id number], DOB:  [birth year], LOS: 2 ADMISSION DATE:  08/28/2023, CONSULTATION DATE:  08/28/23 REFERRING MD:  Dr. Scotty Court, CHIEF COMPLAINT:  Respiratory Failure, AECOPD   Brief Pt Description / Synopsis:  63 y.o. 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 To review, patient has had 4-5 days of SOB, wheezing, and productive green sputum, low grade fevers.    Upon arrival to the ER patient was on 6 liters [place], Temp 97.9, HR 152, RR 24, BP 181/158. [name] was exchanged and patient placed on the ventilator. Cultures sent, ceftriaxone, azithromycin, solumedrol, and duonebs given. Labs revealed WBC 15.6, Lactic acid 1.6, Procalcitonin 0.16, K 3.4, glucose 277, Viral panel negative.  CTA thorax negative for PE, mucus plugging of right middle lobe and bilateral lower lobe mucous plugging with consolidations, and emphysema. CT abd/pelvis severe atherosclerotic plaque of the aorta and its branches, no acute pathology noted.   PCCM asked to admit for further workup and treatment.  Please see "Significant Hospital Events" section below for full detailed hospital course.   Pertinent  Medical History  COPD/emphysema/asthma Chronic respiratory failure, trach placed 2019 HTN/HLD/CHF/CAD DM type 2 insulin dependent  Hep  C Prior Tobacco and cocaine abuse Anxiety/Depression  Micro Data:  3/26: COVID/FLU/RSV PCR>> negative 3/26: Blood culture x2>> no growth to date 3/27: Urine>> 3/27: MRSA PCR>> negative 3/27: RVP>>negative 3/27: Strep pneumo urinary antigen>> negative 3/27: Legionella urinary antigen>> 3/27: Tracheal aspirate>>gram + cocci  Antimicrobials:   Anti-infectives (From admission, onward)    Start     Dose/Rate Route Frequency Ordered Stop   08/29/23 2000  azithromycin (ZITHROMAX) 500 mg in sodium chloride 0.9 % 250 mL IVPB        500 mg 250 mL/hr over 60 Minutes Intravenous Every 24 hours 08/28/23 2223     08/29/23 2000  cefTRIAXone (ROCEPHIN) 2 g in sodium chloride 0.9 % 100 mL IVPB  Status:  Discontinued        2 g 200 mL/hr over 30 Minutes Intravenous Every 24 hours 08/28/23 2223 08/29/23 0224   08/29/23 0315  ceFEPIme (MAXIPIME) 2 g in sodium chloride 0.9 % 100 mL IVPB        2 g 200 mL/hr over 30 Minutes Intravenous Every 8 hours 08/29/23 0224     08/28/23 1900  cefTRIAXone (ROCEPHIN) 2 g in sodium chloride 0.9 % 100 mL IVPB        2 g 200 mL/hr over 30 Minutes Intravenous Once 08/28/23 1845 08/28/23 2000   08/28/23 1900  azithromycin (ZITHROMAX) 500 mg in sodium chloride 0.9 % 250 mL IVPB        500 mg 250 mL/hr over 60 Minutes Intravenous  Once 08/28/23 1845 08/28/23 2107       Significant Hospital Events: Including procedures, antibiotic start and stop dates in addition to other pertinent events   3/26: Admit to ICU on home  ventilator with acute on chronic respiratory failure. Cultures pending, broad spectrum antibiotics.  3/27: On minimal vent settings, with intermittent anxiety requesting to resume her home Klonopin.  Discussed with Dr. Belia Heman, will allow her to start diet.  Cultures still pending, continue broad spectrum antibiotics. 3/28: No acute events overnight, on minimal vent support.  Tracheal aspirate preliminary results with Gram + cocci   Interim History /  Subjective:  As outlined above under significant hospital events section  Objective   Blood pressure (!) 162/119, pulse (!) 108, temperature (!) 96.7 F (35.9 C), temperature source Axillary, resp. rate (!) 23, height 5' (1.524 m), weight 76.4 kg, last menstrual period 03/06/2005, SpO2 100%.    Vent Mode: PRVC FiO2 (%):  [40 %] 40 % Set Rate:  [16 bmp] 16 bmp Vt Set:  [400 mL] 400 mL PEEP:  [5 cmH20] 5 cmH20 Plateau Pressure:  [10 cmH20] 10 cmH20   Intake/Output Summary (Last 24 hours) at 08/30/2023 0758 Last data filed at 08/30/2023 0740 Gross per 24 hour  Intake 1149.86 ml  Output 1750 ml  Net -600.14 ml   Filed Weights   08/28/23 1922 08/29/23 0403 08/30/23 0500  Weight: 74.9 kg 75.7 kg 76.4 kg    Examination: General: Acute on chronically ill appearing obese female, sitting in bed, on mechanical ventilation via chronic trach, no acute distress HENT: Atraumatic, normocephalic, neck supple, difficult to assess JVD due to body habitus Lungs: Coarse breath sounds throughout, even, nonlabored, normal effort Cardiovascular: Regular rate and rhythm, S1-S2, no murmurs, rubs, gallops Abdomen: Obese, soft, nontender, nondistended, no guarding or rebound tenderness, bowel sounds positive x 4 Extremities: Normal bulk and tone, no deformities, no edema, no cyanosis Neuro: Awake and alert, oriented x 4, moves all extremities to commands, no focal deficits GU: External female catheter in place  Resolved Hospital Problem list     Assessment & Plan:   #Acute on Chronic Hypoxic & Hypercapnic Respiratory failure in the setting of ... #Acute COPD Exacerbation  #Suspected Pneumonia #Mucus Plugging PMHx: COPD/Emphysema/Asthma with chronic tracheostomy and home vent. Pseudomonas and Acinetobacter PNA  -Full vent support, implement lung protective strategies -Plateau pressures less than 30 cm H20 -Wean FiO2 & PEEP as tolerated to maintain O2 sats 88 to 92% -Follow intermittent Chest X-ray  & ABG as needed -Spontaneous Breathing Trials when respiratory parameters met and mental status permits -Implement VAP Bundle -Bronchodilators, Pulmicort nebs, and Hypertonic saline nebs  -IV steroids (decreased to 20 mg BID on 3/27) -ABX as above  #Suspected Pneumonia #UTI (? Colonization) -Monitor fever curve -Trend WBC's  -Follow cultures as above -Continue empiric Azithromycin, Cefepime pending cultures & sensitivities  #Anxiety/Depression -Prn Valium -Continue outpatient Klonopin    #Type 2 Diabetes Mellitus, insulin dependent -CBG's q4h; Target range of 140 to 180 -SSI -Follow ICU Hypo/Hyperglycemia protocol   #Hypertension Hx: Hyperlipidemia, CAD, CHF -Cardiac monitoring -MAP goal > 65, SBP < 160 -Resume home antihypertensives      Best Practice (right click and "Reselect all SmartList Selections" daily)   Diet/type: Regular consistency (see orders) DVT prophylaxis: LMWH GI prophylaxis: H2B Lines: N/A Foley:  N/A Code Status:  full code Last date of multidisciplinary goals of care discussion [3/28]  3/28: Pt updated at bedside on plan of care.  Labs   CBC: Recent Labs  Lab 08/28/23 1846 08/29/23 0101 08/29/23 0307 08/30/23 0118  WBC 15.6* 14.6* 14.2* 15.0*  NEUTROABS 10.4*  --   --   --   HGB 13.0 11.5* 11.2* 11.9*  HCT 41.7 36.3 36.1 38.7  MCV 83.9 84.0 84.1 85.4  PLT 332 257 270 312    Basic Metabolic Panel: Recent Labs  Lab 08/28/23 1846 08/29/23 0101 08/29/23 0307 08/30/23 0118  NA 139 138 136 141  K 3.4* 4.4 4.6 4.8  CL 103 107 103 106  CO2 24 23 23 24   GLUCOSE 277* 342* 344* 252*  BUN 11 10 10 13   CREATININE 0.65 0.64 0.71 0.61  CALCIUM 9.4 8.2* 8.3* 9.4  MG  --  2.0 2.0 2.2  PHOS  --  3.2 2.9 2.8   GFR: Estimated Creatinine Clearance: 66.6 mL/min (by C-G formula based on SCr of 0.61 mg/dL). Recent Labs  Lab 08/28/23 1846 08/29/23 0101 08/29/23 0307 08/30/23 0118  PROCALCITON 0.16  --   --   --   WBC 15.6* 14.6*  14.2* 15.0*  LATICACIDVEN 1.6 1.7  --   --     Liver Function Tests: Recent Labs  Lab 08/28/23 1846  AST 23  ALT 19  ALKPHOS 94  BILITOT 0.6  PROT 8.4*  ALBUMIN 4.2   No results for input(s): "LIPASE", "AMYLASE" in the last 168 hours. No results for input(s): "AMMONIA" in the last 168 hours.  ABG    Component Value Date/Time   PHART 7.31 (L) 09/30/2021 0500   PCO2ART 46 09/30/2021 0500   PO2ART 155 (H) 09/30/2021 0500   HCO3 24.8 08/29/2023 0307   ACIDBASEDEF 1.5 08/29/2023 0307   O2SAT 97.5 08/29/2023 0307     Coagulation Profile: Recent Labs  Lab 08/28/23 1846  INR 1.0    Cardiac Enzymes: No results for input(s): "CKTOTAL", "CKMB", "CKMBINDEX", "TROPONINI" in the last 168 hours.  HbA1C: Hemoglobin A1C  Date/Time Value Ref Range Status  06/29/2013 04:02 AM 6.0 4.2 - 6.3 % Final    Comment:    The American Diabetes Association recommends that a primary goal of therapy should be <7% and that physicians should reevaluate the treatment regimen in patients with HbA1c values consistently >8%.    Hgb A1c MFr Bld  Date/Time Value Ref Range Status  08/29/2023 01:01 AM 9.4 (H) 4.8 - 5.6 % Final    Comment:    (NOTE) Pre diabetes:          5.7%-6.4%  Diabetes:              >6.4%  Glycemic control for   <7.0% adults with diabetes   02/19/2023 04:33 AM 10.3 (H) 4.8 - 5.6 % Final    Comment:    (NOTE)         Prediabetes: 5.7 - 6.4         Diabetes: >6.4         Glycemic control for adults with diabetes: <7.0     CBG: Recent Labs  Lab 08/29/23 1628 08/29/23 1958 08/29/23 2320 08/30/23 0340 08/30/23 0743  GLUCAP 274* 237* 262* 251* 175*    Review of Systems:   Positives in BOLD: Gen: Denies fever, chills, weight change, fatigue, night sweats HEENT: Denies blurred vision, double vision, hearing loss, tinnitus, sinus congestion, rhinorrhea, sore throat, neck stiffness, dysphagia PULM: Denies shortness of breath, cough, sputum production,  hemoptysis, wheezing CV: Denies chest pain, edema, orthopnea, paroxysmal nocturnal dyspnea, palpitations GI: Denies abdominal pain, nausea, vomiting, diarrhea, hematochezia, melena, constipation, change in bowel habits GU: Denies dysuria, hematuria, polyuria, oliguria, urethral discharge Endocrine: Denies hot or cold intolerance, polyuria, polyphagia or appetite change Derm: Denies rash, dry skin, scaling or peeling skin change Heme:  Denies easy bruising, bleeding, bleeding gums Neuro: Denies headache, numbness, weakness, slurred speech, loss of memory or consciousness   Past Medical History:  She,  has a past medical history of Allergy, Anxiety, Asthma, CHF (congestive heart failure) (HCC), Cocaine abuse (HCC), COPD (chronic obstructive pulmonary disease) (HCC), Coronary artery disease, Emphysema/COPD (HCC), Hepatitis C, High cholesterol, Hypertension, Pneumothorax, and Tobacco abuse.   Surgical History:   Past Surgical History:  Procedure Laterality Date   CARDIAC CATHETERIZATION     CHEST TUBE INSERTION     IR GASTROSTOMY TUBE MOD SED  10/31/2017   TRACHEOSTOMY TUBE PLACEMENT N/A 10/17/2017   Procedure: TRACHEOSTOMY;  Surgeon: Bud Face, MD;  Location: [facility] ORS;  Service: ENT;  Laterality: N/A;     Social History:   reports that she quit smoking about 6 years ago. Her smoking use included cigarettes. She started smoking about 47 years ago. She has a 4.1 pack-year smoking history. She has never used smokeless tobacco. She reports that she does not currently use drugs after having used the following drugs: "Crack" cocaine. She reports that she does not drink alcohol.   Family History:  Her family history includes Asthma in her mother; Breast cancer (age of onset: 58) in her maternal aunt; CAD in her father; COPD in her sister; CVA in her father; Lung cancer in her mother; Stroke in her father.   Allergies Allergies  Allergen Reactions   Amoxicillin     Patient has tolerated  cephalosporins in the past   Ativan [Lorazepam]     Makes anxiety worse      Home Medications  Prior to Admission medications   Medication Sig Start Date End Date Taking? Authorizing Provider  acetaminophen (TYLENOL) 325 MG tablet Take 2 tablets (650 mg total) by mouth every 6 (six) hours as needed for mild pain (pain score 1-3) (or Fever >/= 101). 04/24/23   Loyce Dys, MD  albuterol (PROVENTIL) (2.5 MG/3ML) 0.083% nebulizer solution Take 3 mLs (2.5 mg total) by nebulization every 6 (six) hours as needed for wheezing or shortness of breath. 03/27/23   Salena Saner, MD  albuterol (VENTOLIN HFA) 108 (90 Base) MCG/ACT inhaler Inhale 2 puffs into the lungs every 6 (six) hours as needed for wheezing or shortness of breath. 04/02/22   Parrett, Virgel Bouquet, NP  amLODipine (NORVASC) 5 MG tablet Take 5 mg by mouth daily. 08/27/21   [provider]  aspirin 81 MG chewable tablet Chew 81 mg by mouth daily.     [provider]  azelastine (ASTELIN) 0.1 % nasal spray SMARTSIG:1-2 Spray(s) Both Nares Every 12 Hours PRN 09/22/20   [provider]  azithromycin (ZITHROMAX Z-PAK) 250 MG tablet Take 2 tablets on Day 1 and then 1 tablet daily till gone. 06/17/23   Erin Fulling, MD  benzonatate (TESSALON PERLES) 100 MG capsule Take 1-2 capsules (100-200 mg total) by mouth 3 (three) times daily as needed for cough. 02/22/23   Sunnie Nielsen, DO  Blood Glucose Monitoring Suppl ([employer] BLOOD GLUCOSE METER) w/Device KIT Use as directed to monitor blood glucose up to qid 02/22/23   Sunnie Nielsen, DO  cetirizine (ZYRTEC) 10 MG tablet Take 10 mg by mouth daily. 03/21/22   [provider]  clonazePAM (KLONOPIN) 0.5 MG tablet Take 0.5 mg by mouth in the morning, at noon, and at bedtime.    [provider]  cloNIDine (CATAPRES - DOSED IN MG/24 HR) 0.1 mg/24hr patch Place 0.1 mg onto the skin once a week.  [provider]  D3-50 1.25 MG (50000 UT) capsule Take  50,000 Units by mouth once a week. 12/28/22   [provider]  docusate sodium (COLACE) 100 MG capsule Take 2 capsules (200 mg total) by mouth daily as needed for mild constipation. 04/24/23   Loyce Dys, MD  fluticasone (FLONASE) 50 MCG/ACT nasal spray Place 1 spray into both nostrils daily.    [provider]  guaiFENesin (MUCINEX) 600 MG 12 hr tablet Take 600 mg by mouth 2 (two) times daily.    [provider]  hydrOXYzine (VISTARIL) 25 MG capsule Take 25 mg by mouth every 6 (six) hours as needed. 10/01/22   [provider]  ipratropium-albuterol (DUONEB) 0.5-2.5 (3) MG/3ML SOLN Take 3 mLs by nebulization every 6 (six) hours as needed (for shortness of breath). 06/17/23   Erin Fulling, MD  LANTUS SOLOSTAR 100 UNIT/ML Solostar Pen Inject 10 Units into the skin at bedtime. 01/03/23   [provider]  levofloxacin (LEVAQUIN) 750 MG tablet Take 1 tablet (750 mg total) by mouth daily. 08/28/23   Erin Fulling, MD  mirtazapine (REMERON) 30 MG tablet Take 30 mg by mouth at bedtime. 01/19/21   [provider]  mupirocin ointment (BACTROBAN) 2 % Apply 1 Application topically daily. To inside nose and to affected area of axilla 05/21/22   Moye, [place], MD  naloxone [facility]) nasal spray 4 mg/0.1 mL Place 1 spray into the nose once. 10/22/22   [provider]  NOVOLOG FLEXPEN 100 UNIT/ML FlexPen Inject into the skin 3 (three) times daily with meals. Sliding scale mealtime insulin. 03/01/21   [provider]  nystatin ointment (MYCOSTATIN) Apply 1 Application topically 3 (three) times daily. 01/04/23   [provider]  oxyCODONE-acetaminophen (PERCOCET/ROXICET) 5-325 MG tablet Take 1 tablet by mouth 3 (three) times daily as needed for pain. 09/07/21   [provider]  PARoxetine (PAXIL) 40 MG tablet Take 40 mg by mouth every morning. Give 30 mg x 1 week (1.5 tab of 20 mg) then start 40 mg daily. D/c 20 mg pills at that time.     [provider]  polyethylene glycol powder (GLYCOLAX/MIRALAX) 17 GM/SCOOP powder Take by mouth.    [provider]  predniSONE (DELTASONE) 20 MG tablet Take 1 tablet (20 mg total) by mouth daily with breakfast. 7 days 06/17/23   Erin Fulling, MD  predniSONE (DELTASONE) 20 MG tablet Take 1 tablet (20 mg total) by mouth daily with breakfast for 10 days. 10 days 08/28/23 09/07/23  Erin Fulling, MD  PULMICORT 0.5 MG/2ML nebulizer solution  02/13/21   [provider]  terbinafine (LAMISIL) 1 % cream Apply 1 application  topically 2 (two) times daily. To affected skin, for 1 week after resolution of rash. # 30 gm, 1 refill.    [provider]  TRULICITY 4.5 MG/0.5ML SOPN Inject 4.5 mg into the skin once a week. 09/28/21   [provider]  Vitamin D, Ergocalciferol, (DRISDOL) 1.25 MG (50000 UNIT) CAPS capsule Take 50,000 Units by mouth 2 (two) times a week.    [provider]  fluconazole (DIFLUCAN) 150 MG tablet Take 1 tablet day 1 repeat in 1 week 11/15/20   Neale Burly, [place], MD     Critical care time: 40 minutes     Harlon Ditty, AGACNP-BC [facility] Prefer epic messenger for cross cover needs If after hours, please call E-link

## 2023-08-30 NOTE — Progress Notes (Signed)
 Patients daughter updated, password obtained and placed on chart.

## 2023-08-30 NOTE — Plan of Care (Signed)
  Problem: Role Relationship: Goal: Method of communication will improve Outcome: Progressing   Problem: Clinical Measurements: Goal: Respiratory complications will improve Outcome: Progressing   Problem: Activity: Goal: Risk for activity intolerance will decrease Outcome: Progressing   Problem: Elimination: Goal: Will not experience complications related to urinary retention Outcome: Progressing   Problem: Safety: Goal: Ability to remain free from injury will improve Outcome: Progressing   Problem: Skin Integrity: Goal: Risk for impaired skin integrity will decrease Outcome: Progressing   Problem: Coping: Goal: Ability to adjust to condition or change in health will improve Outcome: Progressing   Problem: Skin Integrity: Goal: Risk for impaired skin integrity will decrease Outcome: Progressing   Problem: Tissue Perfusion: Goal: Adequacy of tissue perfusion will improve Outcome: Progressing   Problem: Coping: Goal: Level of anxiety will decrease Outcome: Not Progressing   Problem: Pain Managment: Goal: General experience of comfort will improve and/or be controlled Outcome: Not Progressing   Problem: Metabolic: Goal: Ability to maintain appropriate glucose levels will improve Outcome: Not Progressing   Problem: Nutritional: Goal: Maintenance of adequate nutrition will improve Outcome: Not Progressing

## 2023-08-31 DIAGNOSIS — J189 Pneumonia, unspecified organism: Secondary | ICD-10-CM | POA: Diagnosis not present

## 2023-08-31 DIAGNOSIS — E1165 Type 2 diabetes mellitus with hyperglycemia: Secondary | ICD-10-CM | POA: Diagnosis not present

## 2023-08-31 DIAGNOSIS — J9621 Acute and chronic respiratory failure with hypoxia: Secondary | ICD-10-CM | POA: Diagnosis not present

## 2023-08-31 DIAGNOSIS — J9622 Acute and chronic respiratory failure with hypercapnia: Secondary | ICD-10-CM | POA: Diagnosis not present

## 2023-08-31 LAB — CBC
HCT: 35.7 % — ABNORMAL LOW (ref 36.0–46.0)
Hemoglobin: 11.3 g/dL — ABNORMAL LOW (ref 12.0–15.0)
MCH: 26.3 pg (ref 26.0–34.0)
MCHC: 31.7 g/dL (ref 30.0–36.0)
MCV: 83.2 fL (ref 80.0–100.0)
Platelets: 332 10*3/uL (ref 150–400)
RBC: 4.29 MIL/uL (ref 3.87–5.11)
RDW: 14.6 % (ref 11.5–15.5)
WBC: 10.6 10*3/uL — ABNORMAL HIGH (ref 4.0–10.5)
nRBC: 0 % (ref 0.0–0.2)

## 2023-08-31 LAB — BASIC METABOLIC PANEL WITH GFR
Anion gap: 11 (ref 5–15)
BUN: 22 mg/dL (ref 8–23)
CO2: 26 mmol/L (ref 22–32)
Calcium: 9.5 mg/dL (ref 8.9–10.3)
Chloride: 101 mmol/L (ref 98–111)
Creatinine, Ser: 0.65 mg/dL (ref 0.44–1.00)
GFR, Estimated: >60 mL/min (ref >60)
Glucose, Bld: 218 mg/dL — ABNORMAL HIGH (ref 70–99)
Potassium: 4.2 mmol/L (ref 3.5–5.1)
Sodium: 138 mmol/L (ref 135–145)

## 2023-08-31 LAB — GLUCOSE, CAPILLARY
Glucose-Capillary: 269 mg/dL — ABNORMAL HIGH (ref 70–99)
Glucose-Capillary: 161 mg/dL — ABNORMAL HIGH (ref 70–99)
Glucose-Capillary: 167 mg/dL — ABNORMAL HIGH (ref 70–99)
Glucose-Capillary: 168 mg/dL — ABNORMAL HIGH (ref 70–99)
Glucose-Capillary: 211 mg/dL — ABNORMAL HIGH (ref 70–99)

## 2023-08-31 LAB — MAGNESIUM: Magnesium: 2.2 mg/dL (ref 1.7–2.4)

## 2023-08-31 LAB — LEGIONELLA PNEUMOPHILA SEROGP 1 UR AG: L. pneumophila Serogp 1 Ur Ag: NEGATIVE

## 2023-08-31 LAB — PHOSPHORUS: Phosphorus: 2.1 mg/dL — ABNORMAL LOW (ref 2.5–4.6)

## 2023-08-31 MED ORDER — PREDNISONE 20 MG PO TABS
40.0000 mg | ORAL_TABLET | Freq: Every day | ORAL | Status: DC
Start: 2023-09-01 — End: 2023-09-02
  Administered 2023-09-01 – 2023-09-02 (×2): 40 mg via ORAL
  Filled 2023-08-31 (×2): qty 2

## 2023-08-31 MED ORDER — GUAIFENESIN-DM 100-10 MG/5ML PO SYRP
15.0000 mL | ORAL_SOLUTION | ORAL | Status: DC | PRN
  Administered 2023-08-31 – 2023-09-05 (×8): 15 mL via ORAL
  Filled 2023-08-31 (×4): qty 20
  Filled 2023-08-31: qty 15
  Filled 2023-08-31 (×3): qty 20

## 2023-08-31 MED ORDER — ORAL CARE MOUTH RINSE: 15.0000 mL | OROMUCOSAL | Status: DC | PRN

## 2023-08-31 MED ORDER — K PHOS MONO-SOD PHOS DI & MONO 155-852-130 MG PO TABS
500.0000 mg | ORAL_TABLET | ORAL | Status: AC
  Administered 2023-08-31 (×3): 500 mg via ORAL
  Filled 2023-08-31 (×3): qty 2

## 2023-08-31 MED ORDER — GUAIFENESIN-DM 100-10 MG/5ML PO SYRP
5.0000 mL | ORAL_SOLUTION | ORAL | Status: DC | PRN
  Administered 2023-08-31: 5 mL via ORAL
  Filled 2023-08-31: qty 10

## 2023-08-31 MED ORDER — INSULIN ASPART 100 UNIT/ML IJ SOLN
4.0000 [IU] | Freq: Three times a day (TID) | INTRAMUSCULAR | Status: DC
  Administered 2023-08-31 – 2023-09-03 (×7): 4 [IU] via SUBCUTANEOUS
  Filled 2023-08-31 (×8): qty 1

## 2023-08-31 NOTE — Plan of Care (Signed)
  Problem: Role Relationship: Goal: Method of communication will improve Outcome: Progressing   Problem: Education: Goal: Knowledge of General Education information will improve Description: Including pain rating scale, medication(s)/side effects and non-pharmacologic comfort measures Outcome: Progressing   Problem: Clinical Measurements: Goal: Cardiovascular complication will be avoided Outcome: Progressing   Problem: Nutrition: Goal: Adequate nutrition will be maintained Outcome: Progressing   Problem: Elimination: Goal: Will not experience complications related to urinary retention Outcome: Progressing   Problem: Pain Managment: Goal: General experience of comfort will improve and/or be controlled Outcome: Progressing   Problem: Safety: Goal: Ability to remain free from injury will improve Outcome: Progressing   Problem: Skin Integrity: Goal: Risk for impaired skin integrity will decrease Outcome: Progressing   Problem: Fluid Volume: Goal: Ability to maintain a balanced intake and output will improve Outcome: Progressing   Problem: Health Behavior/Discharge Planning: Goal: Ability to identify and utilize available resources and services will improve Outcome: Progressing   Problem: Tissue Perfusion: Goal: Adequacy of tissue perfusion will improve Outcome: Progressing

## 2023-08-31 NOTE — Progress Notes (Signed)
 NAME:  Michelle [name], MRN:  [id number], DOB:  08/30/1960, LOS: 3 ADMISSION DATE:  08/28/2023, CONSULTATION DATE:  08/28/23 REFERRING MD:  Dr. Scotty Court, CHIEF COMPLAINT:  Respiratory Failure, AECOPD   Brief Pt Description / Synopsis:  63 y.o. 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 To review, patient has had 4-5 days of SOB, wheezing, and productive green sputum, low grade fevers.    Upon arrival to the ER patient was on 6 liters [place], Temp 97.9, HR 152, RR 24, BP 181/158. [name] was exchanged and patient placed on the ventilator. Cultures sent, ceftriaxone, azithromycin, solumedrol, and duonebs given. Labs revealed WBC 15.6, Lactic acid 1.6, Procalcitonin 0.16, K 3.4, glucose 277, Viral panel negative.  CTA thorax negative for PE, mucus plugging of right middle lobe and bilateral lower lobe mucous plugging with consolidations, and emphysema. CT abd/pelvis severe atherosclerotic plaque of the aorta and its branches, no acute pathology noted.   PCCM asked to admit for further workup and treatment.  Please see "Significant Hospital Events" section below for full detailed hospital course.   Pertinent  Medical History  COPD/emphysema/asthma Chronic respiratory failure, trach placed 2019 HTN/HLD/CHF/CAD DM type 2 insulin dependent  Hep  C Prior Tobacco and cocaine abuse Anxiety/Depression  Micro Data:  3/26: COVID/FLU/RSV PCR>> negative 3/26: Blood culture x2>> no growth to date 3/27: Urine>> 3/27: MRSA PCR>> negative 3/27: RVP>>negative 3/27: Strep pneumo urinary antigen>> negative 3/27: Legionella urinary antigen>> 3/27: Tracheal aspirate>>gram + cocci, final cultures pending   Antimicrobials:   Anti-infectives (From admission, onward)    Start     Dose/Rate Route Frequency Ordered Stop   08/29/23 2000  azithromycin (ZITHROMAX) 500 mg in sodium chloride 0.9 % 250 mL IVPB        500 mg 250 mL/hr over 60 Minutes Intravenous Every 24 hours 08/28/23 2223     08/29/23 2000  cefTRIAXone (ROCEPHIN) 2 g in sodium chloride 0.9 % 100 mL IVPB  Status:  Discontinued        2 g 200 mL/hr over 30 Minutes Intravenous Every 24 hours 08/28/23 2223 08/29/23 0224   08/29/23 0315  ceFEPIme (MAXIPIME) 2 g in sodium chloride 0.9 % 100 mL IVPB        2 g 200 mL/hr over 30 Minutes Intravenous Every 8 hours 08/29/23 0224     08/28/23 1900  cefTRIAXone (ROCEPHIN) 2 g in sodium chloride 0.9 % 100 mL IVPB        2 g 200 mL/hr over 30 Minutes Intravenous Once 08/28/23 1845 08/28/23 2000   08/28/23 1900  azithromycin (ZITHROMAX) 500 mg in sodium chloride 0.9 % 250 mL IVPB        500 mg 250 mL/hr over 60 Minutes Intravenous  Once 08/28/23 1845 08/28/23 2107       Significant Hospital Events: Including procedures, antibiotic start and stop dates in addition to other pertinent events   3/26: Admit  to ICU on home ventilator with acute on chronic respiratory failure. Cultures pending, broad spectrum antibiotics.  3/27: On minimal vent settings, with intermittent anxiety requesting to resume her home Klonopin.  Discussed with Dr. Belia Heman, will allow her to start diet.  Cultures still pending, continue broad spectrum antibiotics. 3/28: No acute events overnight, on minimal vent support.  Tracheal aspirate preliminary results with Gram +  cocci 08/31/2023: No acute issues overnight.  Remains on full vent support.  FiO2 down to 35%.  Remains afebrile.   Interim History / Subjective:  Patient reported insomnia.  Was given a dose of melatonin with moderate effect.  Complaining of bilateral lower extremity pain.  Also noted to be hyperglycemic overnight.  Remains on cefepime and azithromycin.  Tmax 98.9 F.  Phosphate level low at 2.1.  Already on phosphate supplements. Objective   Blood pressure (!) 152/83, pulse 72, temperature 98 F (36.7 C), temperature source Oral, resp. rate 14, height 5' (1.524 m), weight 74.4 kg, last menstrual period 03/06/2005, SpO2 100%.    Vent Mode: PRVC FiO2 (%):  [35 %] 35 % Set Rate:  [16 bmp] 16 bmp Vt Set:  [400 mL] 400 mL PEEP:  [5 cmH20] 5 cmH20 Plateau Pressure:  [17 cmH20-22 cmH20] 22 cmH20   Intake/Output Summary (Last 24 hours) at 08/31/2023 0857 Last data filed at 08/31/2023 0400 Gross per 24 hour  Intake 889.94 ml  Output 2975 ml  Net -2085.06 ml   Filed Weights   08/29/23 0403 08/30/23 0500 08/31/23 0500  Weight: 75.7 kg 76.4 kg 74.4 kg    Examination: General: Acute on chronically ill appearing obese female, lying in bed, on mechanical ventilation via chronic trach, no acute distress HENT: Atraumatic, normocephalic, neck supple, difficult to assess JVD due to body habitus Lungs: Normal work of breathing, bilateral breath sounds, diffuse rhonchi in anterior lung fields.   Cardiovascular: Regular rate and rhythm, S1-S2, no murmurs, rubs, gallops Abdomen: Obese, soft, nontender, nondistended, no guarding or rebound tenderness, bowel sounds positive x 4 Extremities: Normal bulk and tone, no deformities, no edema, no cyanosis.  Contractures at the level of the ankle left greater than right. Neuro: Awake and alert, oriented x 4, moves all extremities to commands, no focal deficits GU: External female catheter in place  Resolved Hospital Problem list     Assessment & Plan:    #Acute on Chronic Hypoxic & Hypercapnic Respiratory failure in the setting of ... #Acute COPD Exacerbation  #Suspected Pneumonia-preliminary sputum culture with gram-positive cocci in clusters #Mucus Plugging-improved PMHx: COPD/Emphysema/Asthma with chronic tracheostomy and home vent. Pseudomonas and Acinetobacter PNA  -Off all sedation -Full vent support, implement lung protective strategies -Plateau pressures less than 30 cm H20 -Wean FiO2 & PEEP as tolerated to maintain O2 sats 88 to 92% -Follow intermittent Chest X-ray & ABG as needed -Spontaneous Breathing Trials when respiratory parameters met and mental status permits -Implement VAP Bundle -Bronchodilators, Pulmicort nebs, and Hypertonic saline nebs  -IV steroids (decreased to 20 mg BID on 3/27) -ABX with cefepime and azithromycin -Follow-up sputum cultures  #Suspected Pneumonia #UTI versus colonization -Remains afebrile -Monitor fever curve -Trend WBC's  -Follow cultures as above -Continue empiric Azithromycin, Cefepime pending cultures & sensitivities  #Anxiety/Depression -Prn Valium -Continue outpatient Klonopin    #Type 2 Diabetes Mellitus, insulin dependent-hyperglycemic in light of steroid therapy as well as poor compliance prior to hospitalization.  Last hemoglobin A1c was 9.4%. -Continue CBG's q4h with sliding scale insulin coverage - Target range of 140 to 180 -  Premeal coverage with 4 units of regular insulin 3 times daily AC -Follow ICU Hypo/Hyperglycemia protocol   #Hypertension Hx: Hyperlipidemia, CAD, CHF -Stable.  Not requiring pressors. -Cardiac monitoring -MAP goal > 65, SBP < 160 -Resume home antihypertensives  Best Practice (right click and "Reselect all SmartList Selections" daily)   Diet/type: Regular consistency (see orders) DVT prophylaxis: LMWH GI prophylaxis: H2B Lines: N/A Foley:  N/A Code Status:  full code Last date of multidisciplinary goals of care discussion 3/28  3/29: Pt  updated at bedside on plan of care.  Able to communicate effectively by mouthing words and can also write despite being on the vent  Labs   CBC: Recent Labs  Lab 08/28/23 1846 08/29/23 0101 08/29/23 0307 08/30/23 0118 08/31/23 0529  WBC 15.6* 14.6* 14.2* 15.0* 10.6*  NEUTROABS 10.4*  --   --   --   --   HGB 13.0 11.5* 11.2* 11.9* 11.3*  HCT 41.7 36.3 36.1 38.7 35.7*  MCV 83.9 84.0 84.1 85.4 83.2  PLT 332 257 270 312 332    Basic Metabolic Panel: Recent Labs  Lab 08/28/23 1846 08/29/23 0101 08/29/23 0307 08/30/23 0118 08/31/23 0529  NA 139 138 136 141 138  K 3.4* 4.4 4.6 4.8 4.2  CL 103 107 103 106 101  CO2 24 23 23 24 26   GLUCOSE 277* 342* 344* 252* 218*  BUN 11 10 10 13 22   CREATININE 0.65 0.64 0.71 0.61 0.65  CALCIUM 9.4 8.2* 8.3* 9.4 9.5  MG  --  2.0 2.0 2.2 2.2  PHOS  --  3.2 2.9 2.8 2.1*   GFR: Estimated Creatinine Clearance: 65.7 mL/min (by C-G formula based on SCr of 0.65 mg/dL). Recent Labs  Lab 08/28/23 1846 08/29/23 0101 08/29/23 0307 08/30/23 0118 08/31/23 0529  PROCALCITON 0.16  --   --   --   --   WBC 15.6* 14.6* 14.2* 15.0* 10.6*  LATICACIDVEN 1.6 1.7  --   --   --     Liver Function Tests: Recent Labs  Lab 08/28/23 1846  AST 23  ALT 19  ALKPHOS 94  BILITOT 0.6  PROT 8.4*  ALBUMIN 4.2   No results for input(s): "LIPASE", "AMYLASE" in the last 168 hours. No results for input(s): "AMMONIA" in the last 168 hours.  ABG    Component Value Date/Time   PHART 7.31 (L) 09/30/2021 0500   PCO2ART 46 09/30/2021 0500   PO2ART 155 (H) 09/30/2021 0500   HCO3 24.8 08/29/2023 0307   ACIDBASEDEF 1.5 08/29/2023 0307   O2SAT 97.5 08/29/2023 0307     Coagulation Profile: Recent Labs  Lab 08/28/23 1846  INR 1.0    Cardiac Enzymes: No results for input(s): "CKTOTAL", "CKMB", "CKMBINDEX", "TROPONINI" in the last 168 hours.  HbA1C: Hemoglobin A1C  Date/Time Value Ref Range Status  06/29/2013 04:02 AM 6.0 4.2 - 6.3 % Final    Comment:     The American Diabetes Association recommends that a primary goal of therapy should be <7% and that physicians should reevaluate the treatment regimen in patients with HbA1c values consistently >8%.    Hgb A1c MFr Bld  Date/Time Value Ref Range Status  08/29/2023 01:01 AM 9.4 (H) 4.8 - 5.6 % Final    Comment:    (NOTE) Pre diabetes:          5.7%-6.4%  Diabetes:              >6.4%  Glycemic control for   <7.0% adults with diabetes  02/19/2023 04:33 AM 10.3 (H) 4.8 - 5.6 % Final    Comment:    (NOTE)         Prediabetes: 5.7 - 6.4         Diabetes: >6.4         Glycemic control for adults with diabetes: <7.0     CBG: Recent Labs  Lab 08/30/23 1519 08/30/23 2119 08/30/23 2335 08/31/23 0343 08/31/23 0801  GLUCAP 196* 160* 221* 269* 161*    Review of Systems:   Positives in BOLD: Gen: Denies fever, chills, weight change, fatigue, night sweats HEENT: Denies blurred vision, double vision, hearing loss, tinnitus, sinus congestion, rhinorrhea, sore throat, neck stiffness, dysphagia PULM: Denies shortness of breath, cough, sputum production, hemoptysis, wheezing  CV: Denies chest pain, edema, orthopnea, paroxysmal nocturnal dyspnea, palpitations GI: Denies abdominal pain, nausea, vomiting, diarrhea, hematochezia, melena, constipation, change in bowel habits GU: Denies dysuria, hematuria, polyuria, oliguria, urethral discharge Endocrine: Denies hot or cold intolerance, polyuria, polyphagia or appetite change Derm: Denies rash, dry skin, scaling or peeling skin change Heme: Denies easy bruising, bleeding, bleeding gums Neuro: Denies headache, numbness, weakness, slurred speech, loss of memory or consciousness Extremities: Reports bilateral lower extremity pain  Past Medical History:  She,  has a past medical history of Allergy, Anxiety, Asthma, CHF (congestive heart failure) (HCC), Cocaine abuse (HCC), COPD (chronic obstructive pulmonary disease) (HCC), Coronary artery  disease, Emphysema/COPD (HCC), Hepatitis C, High cholesterol, Hypertension, Pneumothorax, and Tobacco abuse.   Surgical History:   Past Surgical History:  Procedure Laterality Date   CARDIAC CATHETERIZATION     CHEST TUBE INSERTION     IR GASTROSTOMY TUBE MOD SED  10/31/2017   TRACHEOSTOMY TUBE PLACEMENT N/A 10/17/2017   Procedure: TRACHEOSTOMY;  Surgeon: Bud Face, MD;  Location: [facility] ORS;  Service: ENT;  Laterality: N/A;     Social History:   reports that she quit smoking about 6 years ago. Her smoking use included cigarettes. She started smoking about 47 years ago. She has a 4.1 pack-year smoking history. She has never used smokeless tobacco. She reports that she does not currently use drugs after having used the following drugs: "Crack" cocaine. She reports that she does not drink alcohol.   Family History:  Her family history includes Asthma in her mother; Breast cancer (age of onset: 73) in her maternal aunt; CAD in her father; COPD in her sister; CVA in her father; Lung cancer in her mother; Stroke in her father.   Allergies Allergies  Allergen Reactions   Amoxicillin     Patient has tolerated cephalosporins in the past   Ativan [Lorazepam]     Makes anxiety worse      Home Medications  Prior to Admission medications   Medication Sig Start Date End Date Taking? Authorizing Provider  acetaminophen (TYLENOL) 325 MG tablet Take 2 tablets (650 mg total) by mouth every 6 (six) hours as needed for mild pain (pain score 1-3) (or Fever >/= 101). 04/24/23   Loyce Dys, MD  albuterol (PROVENTIL) (2.5 MG/3ML) 0.083% nebulizer solution Take 3 mLs (2.5 mg total) by nebulization every 6 (six) hours as needed for wheezing or shortness of breath. 03/27/23   Salena Saner, MD  albuterol (VENTOLIN HFA) 108 (90 Base) MCG/ACT inhaler Inhale 2 puffs into the lungs every 6 (six) hours as needed for wheezing or shortness of breath. 04/02/22   Parrett, Virgel Bouquet, NP  amLODipine  (NORVASC) 5 MG tablet Take 5 mg by mouth daily. 08/27/21  [provider]  aspirin 81 MG chewable tablet Chew 81 mg by mouth daily.     [provider]  azelastine (ASTELIN) 0.1 % nasal spray SMARTSIG:1-2 Spray(s) Both Nares Every 12 Hours PRN 09/22/20   [provider]  azithromycin (ZITHROMAX Z-PAK) 250 MG tablet Take 2 tablets on Day 1 and then 1 tablet daily till gone. 06/17/23   Erin Fulling, MD  benzonatate (TESSALON PERLES) 100 MG capsule Take 1-2 capsules (100-200 mg total) by mouth 3 (three) times daily as needed for cough. 02/22/23   Sunnie Nielsen, DO  Blood Glucose Monitoring Suppl ([employer] BLOOD GLUCOSE METER) w/Device KIT Use as directed to monitor blood glucose up to qid 02/22/23   Sunnie Nielsen, DO  cetirizine (ZYRTEC) 10 MG tablet Take 10 mg by mouth daily. 03/21/22   [provider]  clonazePAM (KLONOPIN) 0.5 MG tablet Take 0.5 mg by mouth in the morning, at noon, and at bedtime.    [provider]  cloNIDine (CATAPRES - DOSED IN MG/24 HR) 0.1 mg/24hr patch Place 0.1 mg onto the skin once a week.    [provider]  D3-50 1.25 MG (50000 UT) capsule Take 50,000 Units by mouth once a week. 12/28/22   [provider]  docusate sodium (COLACE) 100 MG capsule Take 2 capsules (200 mg total) by mouth daily as needed for mild constipation. 04/24/23   Loyce Dys, MD  fluticasone (FLONASE) 50 MCG/ACT nasal spray Place 1 spray into both nostrils daily.    [provider]  guaiFENesin (MUCINEX) 600 MG 12 hr tablet Take 600 mg by mouth 2 (two) times daily.    [provider]  hydrOXYzine (VISTARIL) 25 MG capsule Take 25 mg by mouth every 6 (six) hours as needed. 10/01/22   [provider]  ipratropium-albuterol (DUONEB) 0.5-2.5 (3) MG/3ML SOLN Take 3 mLs by nebulization every 6 (six) hours as needed (for shortness of breath). 06/17/23   Erin Fulling, MD  LANTUS SOLOSTAR 100 UNIT/ML Solostar Pen Inject 10  Units into the skin at bedtime. 01/03/23   [provider]  levofloxacin (LEVAQUIN) 750 MG tablet Take 1 tablet (750 mg total) by mouth daily. 08/28/23   Erin Fulling, MD  mirtazapine (REMERON) 30 MG tablet Take 30 mg by mouth at bedtime. 01/19/21   [provider]  mupirocin ointment (BACTROBAN) 2 % Apply 1 Application topically daily. To inside nose and to affected area of axilla 05/21/22   Moye, [place], MD  naloxone [facility]) nasal spray 4 mg/0.1 mL Place 1 spray into the nose once. 10/22/22   [provider]  NOVOLOG FLEXPEN 100 UNIT/ML FlexPen Inject into the skin 3 (three) times daily with meals. Sliding scale mealtime insulin. 03/01/21   [provider]  nystatin ointment (MYCOSTATIN) Apply 1 Application topically 3 (three) times daily. 01/04/23   [provider]  oxyCODONE-acetaminophen (PERCOCET/ROXICET) 5-325 MG tablet Take 1 tablet by mouth 3 (three) times daily as needed for pain. 09/07/21   [provider]  PARoxetine (PAXIL) 40 MG tablet Take 40 mg by mouth every morning. Give 30 mg x 1 week (1.5 tab of 20 mg) then start 40 mg daily. D/c 20 mg pills at that time.    [provider]  polyethylene glycol powder (GLYCOLAX/MIRALAX) 17 GM/SCOOP powder Take by mouth.    [provider]  predniSONE (DELTASONE) 20 MG tablet Take 1 tablet (20 mg total) by mouth daily with breakfast. 7 days 06/17/23   Erin Fulling, MD  predniSONE (DELTASONE) 20 MG tablet Take 1 tablet (20 mg total) by mouth daily with breakfast for 10 days. 10 days 08/28/23 09/07/23  Erin Fulling, MD  PULMICORT 0.5 MG/2ML nebulizer solution  02/13/21   [provider]  terbinafine (LAMISIL) 1 % cream Apply 1 application  topically 2 (two) times daily. To affected skin, for 1 week after resolution of rash. # 30 gm, 1 refill.    [provider]  TRULICITY 4.5 MG/0.5ML SOPN Inject 4.5 mg into the skin once a week. 09/28/21   [provider]  Vitamin  D, Ergocalciferol, (DRISDOL) 1.25 MG (50000 UNIT) CAPS capsule Take 50,000 Units by mouth 2 (two) times a week.    [provider]  fluconazole (DIFLUCAN) 150 MG tablet Take 1 tablet day 1 repeat in 1 week 11/15/20   Neale Burly, [place], MD     Critical care time: 40 minutes   Meagan Ancona S. Tukov-Yual, ANP-BC Pulmonary and Critical Care Medicine Prefer epic messenger for cross cover needs or call ICU at [phone] If after hours, please call E-link.  NB: This document was prepared using Dragon voice recognition software and may include unintentional dictation errors.

## 2023-08-31 NOTE — Consult Note (Addendum)
 PHARMACY CONSULT NOTE - ELECTROLYTES  Pharmacy Consult for Electrolyte Monitoring and Replacement   Recent Labs: Height: 5' (152.4 cm) Weight: 74.4 kg (164 lb 0.4 oz) IBW/kg (Calculated) : 45.5 Estimated Creatinine Clearance: 65.7 mL/min (by C-G formula based on SCr of 0.65 mg/dL). Potassium (mmol/L)  Date Value  08/31/2023 4.2  07/11/2014 4.6   Magnesium (mg/dL)  Date Value  16/03/9603 2.2  10/31/2013 2.6 (H)   Calcium (mg/dL)  Date Value  54/02/8118 9.5   Calcium, Total (mg/dL)  Date Value  14/78/2956 8.9   Albumin (g/dL)  Date Value  21/30/8657 4.2  07/09/2014 3.7   Phosphorus (mg/dL)  Date Value  84/69/6295 2.1 (L)  10/31/2013 4.6   Sodium (mmol/L)  Date Value  08/31/2023 138  07/11/2014 139    Assessment  Michelle [name] is a 63 y.o. female presenting with SOB, wheezing, and productive green sputum. Pharmacy has been consulted to monitor and replace electrolytes while under CCM care.  Diet: NPO  Goal of Therapy: Electrolytes WNL  Plan:  Phos 2.1: Kphos 50mg  PO x 3 Check BMP, Mg, Phos with AM labs  Thank you for allowing pharmacy to be a part of this patient's care.  Bettey Costa, PharmD Clinical Pharmacist 08/31/2023 7:19 AM

## 2023-08-31 NOTE — Plan of Care (Signed)
  Problem: Respiratory: Goal: Ability to maintain a clear airway and adequate ventilation will improve Outcome: Progressing   Problem: Role Relationship: Goal: Method of communication will improve Outcome: Progressing   Problem: Education: Goal: Knowledge of General Education information will improve Description: Including pain rating scale, medication(s)/side effects and non-pharmacologic comfort measures Outcome: Progressing   Problem: Clinical Measurements: Goal: Ability to maintain clinical measurements within normal limits will improve Outcome: Progressing Goal: Will remain free from infection Outcome: Progressing Goal: Diagnostic test results will improve Outcome: Progressing   Problem: Nutrition: Goal: Adequate nutrition will be maintained Outcome: Progressing   Problem: Elimination: Goal: Will not experience complications related to bowel motility Outcome: Progressing Goal: Will not experience complications related to urinary retention Outcome: Progressing

## 2023-09-01 DIAGNOSIS — J9622 Acute and chronic respiratory failure with hypercapnia: Secondary | ICD-10-CM | POA: Diagnosis not present

## 2023-09-01 DIAGNOSIS — J189 Pneumonia, unspecified organism: Secondary | ICD-10-CM | POA: Diagnosis not present

## 2023-09-01 DIAGNOSIS — E1165 Type 2 diabetes mellitus with hyperglycemia: Secondary | ICD-10-CM | POA: Diagnosis not present

## 2023-09-01 DIAGNOSIS — J9621 Acute and chronic respiratory failure with hypoxia: Secondary | ICD-10-CM | POA: Diagnosis not present

## 2023-09-01 LAB — MAGNESIUM: Magnesium: 2 mg/dL (ref 1.7–2.4)

## 2023-09-01 LAB — GLUCOSE, CAPILLARY
Glucose-Capillary: 127 mg/dL — ABNORMAL HIGH (ref 70–99)
Glucose-Capillary: 140 mg/dL — ABNORMAL HIGH (ref 70–99)
Glucose-Capillary: 175 mg/dL — ABNORMAL HIGH (ref 70–99)
Glucose-Capillary: 192 mg/dL — ABNORMAL HIGH (ref 70–99)
Glucose-Capillary: 234 mg/dL — ABNORMAL HIGH (ref 70–99)
Glucose-Capillary: 338 mg/dL — ABNORMAL HIGH (ref 70–99)

## 2023-09-01 LAB — CBC
HCT: 38 % (ref 36.0–46.0)
Hemoglobin: 12 g/dL (ref 12.0–15.0)
MCH: 26.3 pg (ref 26.0–34.0)
MCHC: 31.6 g/dL (ref 30.0–36.0)
MCV: 83.3 fL (ref 80.0–100.0)
Platelets: 337 10*3/uL (ref 150–400)
RBC: 4.56 MIL/uL (ref 3.87–5.11)
RDW: 14.4 % (ref 11.5–15.5)
WBC: 11.6 10*3/uL — ABNORMAL HIGH (ref 4.0–10.5)
nRBC: 0 % (ref 0.0–0.2)

## 2023-09-01 LAB — BASIC METABOLIC PANEL WITH GFR
Anion gap: 16 — ABNORMAL HIGH (ref 5–15)
BUN: 23 mg/dL (ref 8–23)
CO2: 25 mmol/L (ref 22–32)
Calcium: 9 mg/dL (ref 8.9–10.3)
Chloride: 99 mmol/L (ref 98–111)
Creatinine, Ser: 0.65 mg/dL (ref 0.44–1.00)
GFR, Estimated: >60 mL/min (ref >60)
Glucose, Bld: 151 mg/dL — ABNORMAL HIGH (ref 70–99)
Potassium: 3.1 mmol/L — ABNORMAL LOW (ref 3.5–5.1)
Sodium: 140 mmol/L (ref 135–145)

## 2023-09-01 LAB — URINE CULTURE: Culture: >=100000 — AB

## 2023-09-01 LAB — PHOSPHORUS: Phosphorus: 3.4 mg/dL (ref 2.5–4.6)

## 2023-09-01 MED ORDER — POTASSIUM CHLORIDE 10 MEQ/100ML IV SOLN
10.0000 meq | INTRAVENOUS | Status: AC
  Administered 2023-09-01 (×3): 10 meq via INTRAVENOUS
  Filled 2023-09-01 (×4): qty 100

## 2023-09-01 MED ORDER — CLONAZEPAM 0.5 MG PO TABS: 0.5000 mg | ORAL_TABLET | Freq: Three times a day (TID) | ORAL | Status: DC | PRN

## 2023-09-01 MED ORDER — SODIUM CHLORIDE 0.9 % IV SOLN
500.0000 mg | INTRAVENOUS | Status: AC
  Administered 2023-09-01: 500 mg via INTRAVENOUS
  Filled 2023-09-01: qty 5

## 2023-09-01 MED ORDER — CHLORHEXIDINE GLUCONATE CLOTH 2 % EX PADS
6.0000 | MEDICATED_PAD | Freq: Every evening | CUTANEOUS | Status: DC
Start: 2023-09-01 — End: 2023-09-05

## 2023-09-01 MED ORDER — AZITHROMYCIN 250 MG PO TABS: 500.0000 mg | ORAL_TABLET | Freq: Once | ORAL | Status: DC

## 2023-09-01 MED ORDER — MIRTAZAPINE 15 MG PO TABS
30.0000 mg | ORAL_TABLET | Freq: Every day | ORAL | Status: DC
  Administered 2023-09-01 – 2023-09-03 (×3): 30 mg via ORAL
  Filled 2023-09-01 (×3): qty 2

## 2023-09-01 MED ORDER — DIAZEPAM 2 MG PO TABS
2.0000 mg | ORAL_TABLET | Freq: Four times a day (QID) | ORAL | Status: DC | PRN
Start: 2023-09-01 — End: 2023-09-05
  Administered 2023-09-01 – 2023-09-05 (×11): 2 mg via ORAL
  Filled 2023-09-01 (×11): qty 1

## 2023-09-01 MED ORDER — POTASSIUM CHLORIDE CRYS ER 20 MEQ PO TBCR
20.0000 meq | EXTENDED_RELEASE_TABLET | ORAL | Status: AC
  Administered 2023-09-01 (×2): 20 meq via ORAL
  Filled 2023-09-01 (×2): qty 1

## 2023-09-01 NOTE — Consult Note (Signed)
 PHARMACY CONSULT NOTE - ELECTROLYTES  Pharmacy Consult for Electrolyte Monitoring and Replacement   Recent Labs: Height: 5' (152.4 cm) Weight: 73.2 kg (161 lb 6 oz) IBW/kg (Calculated) : 45.5 Estimated Creatinine Clearance: 65.1 mL/min (by C-G formula based on SCr of 0.65 mg/dL). Potassium (mmol/L)  Date Value  09/01/2023 3.1 (L)  07/11/2014 4.6   Magnesium (mg/dL)  Date Value  16/03/9603 2.0  10/31/2013 2.6 (H)   Calcium (mg/dL)  Date Value  54/02/8118 9.0   Calcium, Total (mg/dL)  Date Value  14/78/2956 8.9   Albumin (g/dL)  Date Value  21/30/8657 4.2  07/09/2014 3.7   Phosphorus (mg/dL)  Date Value  84/69/6295 3.4  10/31/2013 4.6   Sodium (mmol/L)  Date Value  09/01/2023 140  07/11/2014 139    Assessment  Michelle [name] is a 63 y.o. female presenting with SOB, wheezing, and productive green sputum. Pharmacy has been consulted to monitor and replace electrolytes while under CCM care.  Diet: NPO  Goal of Therapy: Electrolytes WNL  Plan:  Medical team ordered Kcl 10 mEq IV x 4  + Kcl 20 mEq x 2 doses.  F/u with AM labs.   Thank you for allowing pharmacy to be a part of this patient's care.  Ronnald Ramp, PharmD Clinical Pharmacist 09/01/2023 8:04 AM

## 2023-09-01 NOTE — Plan of Care (Signed)
  Problem: Activity: Goal: Ability to tolerate increased activity will improve Outcome: Progressing   Problem: Respiratory: Goal: Ability to maintain a clear airway and adequate ventilation will improve Outcome: Progressing   Problem: Role Relationship: Goal: Method of communication will improve Outcome: Progressing   Problem: Education: Goal: Knowledge of General Education information will improve Description: Including pain rating scale, medication(s)/side effects and non-pharmacologic comfort measures Outcome: Progressing   Problem: Clinical Measurements: Goal: Ability to maintain clinical measurements within normal limits will improve Outcome: Progressing Goal: Will remain free from infection Outcome: Progressing Goal: Diagnostic test results will improve Outcome: Progressing Goal: Respiratory complications will improve Outcome: Progressing

## 2023-09-01 NOTE — Progress Notes (Signed)
 [facility] ADULT ICU REPLACEMENT PROTOCOL   The patient does apply for the [facility] Adult ICU Electrolyte Replacment Protocol based on the criteria listed below:   1.Exclusion criteria: TCTS, ECMO, Dialysis, and Myasthenia Gravis patients 2. Is GFR >/= 30 ml/min? Yes.    Patient's GFR today is >60 3. Is SCr </= 2? Yes.   Patient's SCr is 0.65 mg/dL 4. Did SCr increase >/= 0.5 in 24 hours? No. 5.Pt's weight >40kg  Yes.   6. Abnormal electrolyte(s): K  7. Electrolytes replaced per protocol 8.  Call MD STAT for K+ </= 2.5, Phos </= 1, or Mag </= 1 Physician:  Merryl Hacker Emiyah Spraggins 09/01/2023 4:41 AM

## 2023-09-01 NOTE — Progress Notes (Signed)
 NAME:  Michelle [name], MRN:  [id number], DOB:  [birth year], LOS: 4 ADMISSION DATE:  08/28/2023, CONSULTATION DATE:  08/28/23 REFERRING MD:  Dr. Scotty Court, CHIEF COMPLAINT:  Respiratory Failure, AECOPD   Brief Pt Description / Synopsis:  63 y.o. 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 To review, patient has had 4-5 days of SOB, wheezing, and productive green sputum, low grade fevers.    Upon arrival to the ER patient was on 6 liters [place], Temp 97.9, HR 152, RR 24, BP 181/158. [name] was exchanged and patient placed on the ventilator. Cultures sent, ceftriaxone, azithromycin, solumedrol, and duonebs given. Labs revealed WBC 15.6, Lactic acid 1.6, Procalcitonin 0.16, K 3.4, glucose 277, Viral panel negative.  CTA thorax negative for PE, mucus plugging of right middle lobe and bilateral lower lobe mucous plugging with consolidations, and emphysema. CT abd/pelvis severe atherosclerotic plaque of the aorta and its branches, no acute pathology noted.   PCCM asked to admit for further workup and treatment.  Please see "Significant Hospital Events" section below for full detailed hospital course.   Pertinent  Medical History  COPD/emphysema/asthma Chronic respiratory failure, trach placed 2019 HTN/HLD/CHF/CAD DM type 2 insulin dependent  Hep  C Prior Tobacco and cocaine abuse Anxiety/Depression  Micro Data:  3/26: COVID/FLU/RSV PCR>> negative 3/26: Blood culture x2>> no growth to date 3/27: Urine>> 3/27: MRSA PCR>> negative 3/27: RVP>>negative 3/27: Strep pneumo urinary antigen>> negative 3/27: Legionella urinary antigen>> 3/29: Tracheal aspirate>>gram + cocci, final cultures pending  Antimicrobials:  Cefepime and azithromycin Anti-infectives (From admission, onward)    Start     Dose/Rate Route Frequency Ordered Stop   09/01/23 2200  azithromycin (ZITHROMAX) tablet 500 mg  Status:  Discontinued        500 mg Oral  Once 09/01/23 0807 09/01/23 0808   09/01/23 2200  azithromycin (ZITHROMAX) 500 mg in sodium chloride 0.9 % 250 mL IVPB        500 mg 250 mL/hr over 60 Minutes Intravenous Every 24 hours 09/01/23 0808 09/02/23 2159   08/29/23 2000  azithromycin (ZITHROMAX) 500 mg in sodium chloride 0.9 % 250 mL IVPB  Status:  Discontinued        500 mg 250 mL/hr over 60 Minutes Intravenous Every 24 hours 08/28/23 2223 09/01/23 0807   08/29/23 2000  cefTRIAXone (ROCEPHIN) 2 g in sodium chloride 0.9 % 100 mL IVPB  Status:  Discontinued        2 g 200 mL/hr over 30 Minutes Intravenous Every 24 hours 08/28/23 2223 08/29/23 0224   08/29/23 0315  ceFEPIme (MAXIPIME) 2 g in sodium chloride 0.9 % 100 mL IVPB        2 g 200 mL/hr over 30 Minutes Intravenous Every 8 hours 08/29/23 0224     08/28/23 1900  cefTRIAXone (ROCEPHIN) 2 g in sodium chloride 0.9 % 100 mL IVPB        2 g 200  mL/hr over 30 Minutes Intravenous Once 08/28/23 1845 08/28/23 2000   08/28/23 1900  azithromycin (ZITHROMAX) 500 mg in sodium chloride 0.9 % 250 mL IVPB        500 mg 250 mL/hr over 60 Minutes Intravenous  Once 08/28/23 1845 08/28/23 2107       Significant Hospital Events: Including procedures, antibiotic start and stop dates in addition to other pertinent events   3/26: Admit to ICU on home ventilator with acute on chronic respiratory failure.  Cultures pending, broad spectrum antibiotics.  3/27: On minimal vent settings, with intermittent anxiety requesting to resume her home Klonopin.  Discussed with Dr. Belia Heman, will allow her to start diet.  Cultures still pending, continue broad spectrum antibiotics. 3/28: No acute events overnight, on minimal vent support.  Tracheal aspirate preliminary results with Gram + cocci 08/31/2023: No acute issues overnight.  Remains on full vent support.  FiO2 down to 35%.  Remains afebrile. 3/30: No acute issues overnight.  P.o. meds restarted   Interim History / Subjective:  No acute issues overnight.  Last bowel movement.  Reports sleeping well.  Oral medications started.  Offers no new complaints. Objective   Blood pressure (!) 165/90, pulse 78, temperature 97.8 F (36.6 C), temperature source Oral, resp. rate 18, height 5' (1.524 m), weight 73.2 kg, last menstrual period 03/06/2005, SpO2 100%.    Vent Mode: PRVC FiO2 (%):  [30 %-35 %] 35 % Set Rate:  [16 bmp] 16 bmp Vt Set:  [400 mL] 400 mL PEEP:  [5 cmH20] 5 cmH20 Plateau Pressure:  [19 cmH20] 19 cmH20   Intake/Output Summary (Last 24 hours) at 09/01/2023 1057 Last data filed at 09/01/2023 0900 Gross per 24 hour  Intake 780.95 ml  Output 1050 ml  Net -269.05 ml   Filed Weights   08/30/23 0500 08/31/23 0500 09/01/23 0423  Weight: 76.4 kg 74.4 kg 73.2 kg    Examination: General: Acute on chronically ill appearing obese female, lying in bed, on mechanical ventilation via chronic trach, no acute distress.  Communicating by mouthing words and nodding HENT: Atraumatic, normocephalic, neck supple, difficult to assess JVD due to body habitus Lungs: Normal work of breathing, bilateral breath sounds, diffuse rhonchi in anterior lung fields, no wheezing.   Cardiovascular: Regular rate and rhythm, S1-S2, no murmurs, rubs, gallops. Abdomen: Obese, soft, nontender, nondistended, no guarding or rebound tenderness, bowel sounds positive x  4 Extremities: Normal bulk and tone, no deformities, no edema, no cyanosis.  Contractures at the level of the ankle left greater than right. Neuro: Awake and alert, oriented x 4, moves all extremities to commands, no focal deficits GU: External female catheter in place, draining normally  Resolved Hospital Problem list   None  Assessment & Plan:   #Acute on Chronic Hypoxic & Hypercapnic Respiratory failure in the setting of ... #Acute COPD Exacerbation  #Suspected Pneumonia-preliminary sputum culture with gram-positive cocci in clusters #Mucus Plugging-improved PMHx: COPD/Emphysema/Asthma with chronic tracheostomy and home vent. Pseudomonas and Acinetobacter PNA  -Off all sedation -Full vent support with current settings. - Implement lung protective strategies -Plateau pressures less than 30 cm H20 -Wean FiO2 & PEEP as tolerated to maintain O2 sats 88 to 92% -Follow intermittent Chest X-ray & ABG as needed -Spontaneous Breathing Trials when respiratory parameters met and mental status permits -Implement VAP Bundle -Bronchodilators, Pulmicort nebs, and Hypertonic saline nebs  -IV steroids discontinued and patient started on prednisone 40 mg daily -ABX with cefepime and azithromycin -Follow-up sputum cultures  #Suspected Pneumonia #  final cultures pending #UTI versus colonization -Remains afebrile -Monitor fever curve -Trend WBC's  -Follow cultures as above -Continue empiric Azithromycin, Cefepime pending cultures & sensitivities  #Anxiety/Depression -Valium titrated down to 2.5 mg every 8 hours as needed -Continue outpatient Klonopin as scheduled.     #Type 2 Diabetes Mellitus wearing insulin therapy: improved with pre-meal coverage. Last hemoglobin A1c was 9.4%. -Continue CBG's q4h with sliding scale insulin coverage - Target range of 140 to 180 -Premeal coverage with 4 units of regular insulin 3 times daily AC -Follow ICU Hypo/Hyperglycemia protocol -Resume home  medications   #Hypertension Hx: Hyperlipidemia, CAD, CHF -Blood pressure is stable.  -Cardiac monitoring -MAP goal > 65, SBP < 160 -Resume home antihypertensives Chronic pain syndrome -Resume all home medications  Best Practice (right click and "Reselect all SmartList Selections" daily)   Diet/type: Regular consistency (see orders) DVT prophylaxis: LMWH GI prophylaxis: H2B Lines: N/A Foley:  N/A Code Status:  full code Last date of multidisciplinary goals of care discussion 3/30 at bedside.  3/30: Pt updated at bedside on plan of care.  Able to communicate effectively by mouthing words and can also write despite being on the vent  Labs   CBC: Recent Labs  Lab 08/28/23 1846 08/29/23 0101 08/29/23 0307 08/30/23 0118 08/31/23 0529 09/01/23 0326  WBC 15.6* 14.6* 14.2* 15.0* 10.6* 11.6*  NEUTROABS 10.4*  --   --   --   --   --   HGB 13.0 11.5* 11.2* 11.9* 11.3* 12.0  HCT 41.7 36.3 36.1 38.7 35.7* 38.0  MCV 83.9 84.0 84.1 85.4 83.2 83.3  PLT 332 257 270 312 332 337    Basic Metabolic Panel: Recent Labs  Lab 08/29/23 0101 08/29/23 0307 08/30/23 0118 08/31/23 0529 09/01/23 0326  NA 138 136 141 138 140  K 4.4 4.6 4.8 4.2 3.1*  CL 107 103 106 101 99  CO2 23 23 24 26 25   GLUCOSE 342* 344* 252* 218* 151*  BUN 10 10 13 22 23   CREATININE 0.64 0.71 0.61 0.65 0.65  CALCIUM 8.2* 8.3* 9.4 9.5 9.0  MG 2.0 2.0 2.2 2.2 2.0  PHOS 3.2 2.9 2.8 2.1* 3.4   GFR: Estimated Creatinine Clearance: 65.1 mL/min (by C-G formula based on SCr of 0.65 mg/dL). Recent Labs  Lab 08/28/23 1846 08/29/23 0101 08/29/23 0307 08/30/23 0118 08/31/23 0529 09/01/23 0326  PROCALCITON 0.16  --   --   --   --   --   WBC 15.6* 14.6* 14.2* 15.0* 10.6* 11.6*  LATICACIDVEN 1.6 1.7  --   --   --   --     Liver Function Tests: Recent Labs  Lab 08/28/23 1846  AST 23  ALT 19  ALKPHOS 94  BILITOT 0.6  PROT 8.4*  ALBUMIN 4.2   No results for input(s): "LIPASE", "AMYLASE" in the last 168  hours. No results for input(s): "AMMONIA" in the last 168 hours.  ABG    Component Value Date/Time   PHART 7.31 (L) 09/30/2021 0500   PCO2ART 46 09/30/2021 0500   PO2ART 155 (H) 09/30/2021 0500   HCO3 24.8 08/29/2023 0307   ACIDBASEDEF 1.5 08/29/2023 0307   O2SAT 97.5 08/29/2023 0307     Coagulation Profile: Recent Labs  Lab 08/28/23 1846  INR 1.0    Cardiac Enzymes: No results for input(s): "CKTOTAL", "CKMB", "CKMBINDEX", "TROPONINI" in the last 168 hours.  HbA1C: Hemoglobin A1C  Date/Time Value Ref Range Status  06/29/2013 04:02 AM 6.0 4.2 - 6.3 % Final  Comment:    The American Diabetes Association recommends that a primary goal of therapy should be <7% and that physicians should reevaluate the treatment regimen in patients with HbA1c values consistently >8%.    Hgb A1c MFr Bld  Date/Time Value Ref Range Status  08/29/2023 01:01 AM 9.4 (H) 4.8 - 5.6 % Final    Comment:    (NOTE) Pre diabetes:          5.7%-6.4%  Diabetes:              >6.4%  Glycemic control for   <7.0% adults with diabetes   02/19/2023 04:33 AM 10.3 (H) 4.8 - 5.6 % Final    Comment:    (NOTE)         Prediabetes: 5.7 - 6.4         Diabetes: >6.4         Glycemic control for adults with diabetes: <7.0     CBG: Recent Labs  Lab 08/31/23 1600 08/31/23 1943 09/01/23 0054 09/01/23 0407 09/01/23 0751  GLUCAP 168* 211* 234* 140* 127*    Review of Systems:   Positives in BOLD: Gen: Denies fever, chills, weight change, fatigue, night sweats HEENT: Denies blurred vision, double vision, hearing loss, tinnitus, sinus congestion, rhinorrhea, sore throat, neck stiffness, dysphagia PULM: Denies shortness of breath, cough, sputum production, hemoptysis, wheezing  CV: Denies chest pain, edema, orthopnea, paroxysmal nocturnal dyspnea, palpitations GI: Denies abdominal pain, nausea, vomiting, diarrhea, hematochezia, melena, constipation, change in bowel habits GU: Denies dysuria, hematuria,  polyuria, oliguria, urethral discharge Endocrine: Denies hot or cold intolerance, polyuria, polyphagia or appetite change Derm: Denies rash, dry skin, scaling or peeling skin change Heme: Denies easy bruising, bleeding, bleeding gums Neuro: Denies headache, numbness, weakness, slurred speech, loss of memory or consciousness Extremities: Reports bilateral lower extremity pain  Past Medical History:  She,  has a past medical history of Allergy, Anxiety, Asthma, CHF (congestive heart failure) (HCC), Cocaine abuse (HCC), COPD (chronic obstructive pulmonary disease) (HCC), Coronary artery disease, Emphysema/COPD (HCC), Hepatitis C, High cholesterol, Hypertension, Pneumothorax, and Tobacco abuse.   Surgical History:   Past Surgical History:  Procedure Laterality Date   CARDIAC CATHETERIZATION     CHEST TUBE INSERTION     IR GASTROSTOMY TUBE MOD SED  10/31/2017   TRACHEOSTOMY TUBE PLACEMENT N/A 10/17/2017   Procedure: TRACHEOSTOMY;  Surgeon: Bud Face, MD;  Location: [facility] ORS;  Service: ENT;  Laterality: N/A;     Social History:   reports that she quit smoking about 6 years ago. Her smoking use included cigarettes. She started smoking about 47 years ago. She has a 4.1 pack-year smoking history. She has never used smokeless tobacco. She reports that she does not currently use drugs after having used the following drugs: "Crack" cocaine. She reports that she does not drink alcohol.   Family History:  Her family history includes Asthma in her mother; Breast cancer (age of onset: 49) in her maternal aunt; CAD in her father; COPD in her sister; CVA in her father; Lung cancer in her mother; Stroke in her father.   Allergies Allergies  Allergen Reactions   Amoxicillin     Patient has tolerated cephalosporins in the past   Ativan [Lorazepam]     Makes anxiety worse      Home Medications  Prior to Admission medications   Medication Sig Start Date End Date Taking? Authorizing Provider   acetaminophen (TYLENOL) 325 MG tablet Take 2 tablets (650 mg total) by mouth every  6 (six) hours as needed for mild pain (pain score 1-3) (or Fever >/= 101). 04/24/23   Loyce Dys, MD  albuterol (PROVENTIL) (2.5 MG/3ML) 0.083% nebulizer solution Take 3 mLs (2.5 mg total) by nebulization every 6 (six) hours as needed for wheezing or shortness of breath. 03/27/23   Salena Saner, MD  albuterol (VENTOLIN HFA) 108 (90 Base) MCG/ACT inhaler Inhale 2 puffs into the lungs every 6 (six) hours as needed for wheezing or shortness of breath. 04/02/22   Parrett, Virgel Bouquet, NP  amLODipine (NORVASC) 5 MG tablet Take 5 mg by mouth daily. 08/27/21   [provider]  aspirin 81 MG chewable tablet Chew 81 mg by mouth daily.     [provider]  azelastine (ASTELIN) 0.1 % nasal spray SMARTSIG:1-2 Spray(s) Both Nares Every 12 Hours PRN 09/22/20   [provider]  azithromycin (ZITHROMAX Z-PAK) 250 MG tablet Take 2 tablets on Day 1 and then 1 tablet daily till gone. 06/17/23   Erin Fulling, MD  benzonatate (TESSALON PERLES) 100 MG capsule Take 1-2 capsules (100-200 mg total) by mouth 3 (three) times daily as needed for cough. 02/22/23   Sunnie Nielsen, DO  Blood Glucose Monitoring Suppl ([employer] BLOOD GLUCOSE METER) w/Device KIT Use as directed to monitor blood glucose up to qid 02/22/23   Sunnie Nielsen, DO  cetirizine (ZYRTEC) 10 MG tablet Take 10 mg by mouth daily. 03/21/22   [provider]  clonazePAM (KLONOPIN) 0.5 MG tablet Take 0.5 mg by mouth in the morning, at noon, and at bedtime.    [provider]  cloNIDine (CATAPRES - DOSED IN MG/24 HR) 0.1 mg/24hr patch Place 0.1 mg onto the skin once a week.    [provider]  D3-50 1.25 MG (50000 UT) capsule Take 50,000 Units by mouth once a week. 12/28/22   [provider]  docusate sodium (COLACE) 100 MG capsule Take 2 capsules (200 mg total) by mouth daily as needed for mild constipation. 04/24/23    Loyce Dys, MD  fluticasone (FLONASE) 50 MCG/ACT nasal spray Place 1 spray into both nostrils daily.    [provider]  guaiFENesin (MUCINEX) 600 MG 12 hr tablet Take 600 mg by mouth 2 (two) times daily.    [provider]  hydrOXYzine (VISTARIL) 25 MG capsule Take 25 mg by mouth every 6 (six) hours as needed. 10/01/22   [provider]  ipratropium-albuterol (DUONEB) 0.5-2.5 (3) MG/3ML SOLN Take 3 mLs by nebulization every 6 (six) hours as needed (for shortness of breath). 06/17/23   Erin Fulling, MD  LANTUS SOLOSTAR 100 UNIT/ML Solostar Pen Inject 10 Units into the skin at bedtime. 01/03/23   [provider]  levofloxacin (LEVAQUIN) 750 MG tablet Take 1 tablet (750 mg total) by mouth daily. 08/28/23   Erin Fulling, MD  mirtazapine (REMERON) 30 MG tablet Take 30 mg by mouth at bedtime. 01/19/21   [provider]  mupirocin ointment (BACTROBAN) 2 % Apply 1 Application topically daily. To inside nose and to affected area of axilla 05/21/22   Moye, [place], MD  naloxone [facility]) nasal spray 4 mg/0.1 mL Place 1 spray into the nose once. 10/22/22   [provider]  NOVOLOG FLEXPEN 100 UNIT/ML FlexPen Inject into the skin 3 (three) times daily with meals. Sliding scale mealtime insulin. 03/01/21   [provider]  nystatin ointment (MYCOSTATIN) Apply 1 Application topically 3 (three) times daily. 01/04/23   [provider]  oxyCODONE-acetaminophen (PERCOCET/ROXICET) 5-325  MG tablet Take 1 tablet by mouth 3 (three) times daily as needed for pain. 09/07/21   [provider]  PARoxetine (PAXIL) 40 MG tablet Take 40 mg by mouth every morning. Give 30 mg x 1 week (1.5 tab of 20 mg) then start 40 mg daily. D/c 20 mg pills at that time.    [provider]  polyethylene glycol powder (GLYCOLAX/MIRALAX) 17 GM/SCOOP powder Take by mouth.    [provider]  predniSONE (DELTASONE) 20 MG tablet Take 1 tablet (20 mg total) by  mouth daily with breakfast. 7 days 06/17/23   Erin Fulling, MD  predniSONE (DELTASONE) 20 MG tablet Take 1 tablet (20 mg total) by mouth daily with breakfast for 10 days. 10 days 08/28/23 09/07/23  Erin Fulling, MD  PULMICORT 0.5 MG/2ML nebulizer solution  02/13/21   [provider]  terbinafine (LAMISIL) 1 % cream Apply 1 application  topically 2 (two) times daily. To affected skin, for 1 week after resolution of rash. # 30 gm, 1 refill.    [provider]  TRULICITY 4.5 MG/0.5ML SOPN Inject 4.5 mg into the skin once a week. 09/28/21   [provider]  Vitamin D, Ergocalciferol, (DRISDOL) 1.25 MG (50000 UNIT) CAPS capsule Take 50,000 Units by mouth 2 (two) times a week.    [provider]  fluconazole (DIFLUCAN) 150 MG tablet Take 1 tablet day 1 repeat in 1 week 11/15/20   Neale Burly, [place], MD     Critical care time: 40 minutes   Rylin Seavey S. Tukov-Yual, ANP-BC Pulmonary and Critical Care Medicine Prefer epic messenger for cross cover needs or call ICU at [phone] If after hours, please call E-link.  NB: This document was prepared using Dragon voice recognition software and may include unintentional dictation errors.

## 2023-09-01 NOTE — Plan of Care (Signed)
  Problem: Respiratory: Goal: Ability to maintain a clear airway and adequate ventilation will improve Outcome: Progressing   Problem: Clinical Measurements: Goal: Ability to maintain clinical measurements within normal limits will improve Outcome: Progressing

## 2023-09-02 ENCOUNTER — Inpatient Hospital Stay

## 2023-09-02 DIAGNOSIS — Z9911 Dependence on respirator [ventilator] status: Secondary | ICD-10-CM

## 2023-09-02 DIAGNOSIS — J189 Pneumonia, unspecified organism: Secondary | ICD-10-CM

## 2023-09-02 DIAGNOSIS — J9621 Acute and chronic respiratory failure with hypoxia: Secondary | ICD-10-CM | POA: Diagnosis not present

## 2023-09-02 DIAGNOSIS — J9622 Acute and chronic respiratory failure with hypercapnia: Secondary | ICD-10-CM | POA: Diagnosis not present

## 2023-09-02 DIAGNOSIS — E1165 Type 2 diabetes mellitus with hyperglycemia: Secondary | ICD-10-CM

## 2023-09-02 LAB — CULTURE, BLOOD (ROUTINE X 2)
Culture: NO GROWTH
Culture: NO GROWTH

## 2023-09-02 LAB — RENAL FUNCTION PANEL
Albumin: 3.2 g/dL — ABNORMAL LOW (ref 3.5–5.0)
Anion gap: 11 (ref 5–15)
BUN: 25 mg/dL — ABNORMAL HIGH (ref 8–23)
CO2: 26 mmol/L (ref 22–32)
Calcium: 9.3 mg/dL (ref 8.9–10.3)
Chloride: 104 mmol/L (ref 98–111)
Creatinine, Ser: 0.65 mg/dL (ref 0.44–1.00)
GFR, Estimated: >60 mL/min (ref >60)
Glucose, Bld: 106 mg/dL — ABNORMAL HIGH (ref 70–99)
Phosphorus: 2.4 mg/dL — ABNORMAL LOW (ref 2.5–4.6)
Potassium: 4 mmol/L (ref 3.5–5.1)
Sodium: 141 mmol/L (ref 135–145)

## 2023-09-02 LAB — GLUCOSE, CAPILLARY
Glucose-Capillary: 109 mg/dL — ABNORMAL HIGH (ref 70–99)
Glucose-Capillary: 129 mg/dL — ABNORMAL HIGH (ref 70–99)
Glucose-Capillary: 160 mg/dL — ABNORMAL HIGH (ref 70–99)
Glucose-Capillary: 166 mg/dL — ABNORMAL HIGH (ref 70–99)
Glucose-Capillary: 221 mg/dL — ABNORMAL HIGH (ref 70–99)
Glucose-Capillary: 248 mg/dL — ABNORMAL HIGH (ref 70–99)
Glucose-Capillary: 316 mg/dL — ABNORMAL HIGH (ref 70–99)
Glucose-Capillary: 348 mg/dL — ABNORMAL HIGH (ref 70–99)

## 2023-09-02 LAB — MAGNESIUM: Magnesium: 2.2 mg/dL (ref 1.7–2.4)

## 2023-09-02 LAB — CBC
HCT: 40.6 % (ref 36.0–46.0)
Hemoglobin: 12.8 g/dL (ref 12.0–15.0)
MCH: 25.9 pg — ABNORMAL LOW (ref 26.0–34.0)
MCHC: 31.5 g/dL (ref 30.0–36.0)
MCV: 82.2 fL (ref 80.0–100.0)
Platelets: 349 10*3/uL (ref 150–400)
RBC: 4.94 MIL/uL (ref 3.87–5.11)
RDW: 14.6 % (ref 11.5–15.5)
WBC: 13 10*3/uL — ABNORMAL HIGH (ref 4.0–10.5)
nRBC: 0 % (ref 0.0–0.2)

## 2023-09-02 MED ORDER — SODIUM CHLORIDE 0.9 % IV SOLN
2.0000 g | Freq: Three times a day (TID) | INTRAVENOUS | Status: AC
  Administered 2023-09-02 – 2023-09-04 (×8): 2 g via INTRAVENOUS
  Filled 2023-09-02 (×9): qty 12.5

## 2023-09-02 MED ORDER — VANCOMYCIN HCL 1500 MG/300ML IV SOLN
1500.0000 mg | Freq: Once | INTRAVENOUS | Status: AC
  Administered 2023-09-02: 1500 mg via INTRAVENOUS
  Filled 2023-09-02: qty 300

## 2023-09-02 MED ORDER — ENSURE ENLIVE PO LIQD
237.0000 mL | Freq: Three times a day (TID) | ORAL | Status: DC
  Administered 2023-09-02 – 2023-09-04 (×4): 237 mL via ORAL

## 2023-09-02 MED ORDER — FLUTICASONE PROPIONATE 50 MCG/ACT NA SUSP
1.0000 | Freq: Every day | NASAL | Status: DC
  Administered 2023-09-02 – 2023-09-05 (×4): 1 via NASAL
  Filled 2023-09-02: qty 16

## 2023-09-02 MED ORDER — IPRATROPIUM-ALBUTEROL 0.5-2.5 (3) MG/3ML IN SOLN
3.0000 mL | RESPIRATORY_TRACT | Status: DC | PRN
  Administered 2023-09-02 – 2023-09-04 (×2): 3 mL via RESPIRATORY_TRACT
  Filled 2023-09-02 (×2): qty 3

## 2023-09-02 MED ORDER — K PHOS MONO-SOD PHOS DI & MONO 155-852-130 MG PO TABS
250.0000 mg | ORAL_TABLET | Freq: Once | ORAL | Status: AC
  Administered 2023-09-02: 250 mg via ORAL
  Filled 2023-09-02: qty 1

## 2023-09-02 MED ORDER — VANCOMYCIN HCL 1250 MG/250ML IV SOLN
1250.0000 mg | INTRAVENOUS | Status: DC
Start: 2023-09-03 — End: 2023-09-07
  Administered 2023-09-03 (×2): 1250 mg via INTRAVENOUS
  Filled 2023-09-02 (×2): qty 250

## 2023-09-02 MED ORDER — MORPHINE SULFATE (PF) 2 MG/ML IV SOLN
1.0000 mg | INTRAVENOUS | Status: DC | PRN
  Administered 2023-09-02: 2 mg via INTRAVENOUS
  Administered 2023-09-02: 1 mg via INTRAVENOUS
  Administered 2023-09-04: 2 mg via INTRAVENOUS
  Filled 2023-09-02 (×3): qty 1

## 2023-09-02 NOTE — Progress Notes (Signed)
 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    PT Comments  Patient alert, agreeable to PT with maximum encouragement, pt fearful of SOB, mobility. Able to perform a few supine exercises with verbal cues, and then LAQ once up in sitting EOB. Supine to sit with minA handheld assist. Fair sitting balance but pt does become more fearful and wary. Sit <> stand with two person handheld assist, modAx2. Pt unable/unwilling to perform again, returned to supine spontaneously, minA to ensure safety lines/leads set up, stated she needs to have a BM. Pt with RN and respiratory at end of session (BM attempted with bed pan twice during session, pt able to roll with minA and use of bed rails.) The patient would benefit from further skilled PT intervention to continue to progress towards goals.   If plan is discharge home, recommend the following: Two people to help with walking and/or transfers;A lot of help with bathing/dressing/bathroom;Assist for transportation;Help with stairs or ramp for entrance;Assistance with cooking/housework   Can travel by Doctor, hospital cushion (measurements PT);Hoyer lift    Recommendations for Other Services       Precautions / Restrictions Precautions Precautions: Fall Restrictions Weight Bearing Restrictions Per Provider Order: No     Mobility  Bed Mobility Overal bed mobility: Needs Assistance Bed Mobility: Sit to Supine, Rolling Rolling: Min assist, Used  rails   Supine to sit: Used rails, HOB elevated, Min assist Sit to supine: Min assist        Transfers Overall transfer level: Needs assistance Equipment used: 2 person hand held assist Transfers: Sit to/from Stand Sit to Stand: Mod assist, +2 physical assistance           General transfer comment: pt anxious and fearful, limiting standing tolerance    Ambulation/Gait                   Stairs             Wheelchair Mobility     Tilt Bed    Modified Rankin (Stroke Patients Only)       Balance Overall balance assessment: Needs assistance Sitting-balance support: Feet supported Sitting balance-Leahy Scale: Fair     Standing balance support: Bilateral upper extremity supported Standing balance-Leahy Scale: Poor                              Communication Communication Factors Affecting Communication: Trach/intubated  Cognition Arousal: Alert Behavior During Therapy: Anxious                             Following commands: Intact      Cueing Cueing Techniques: Verbal cues, Tactile cues  Exercises      General Comments        Pertinent Vitals/Pain Pain Assessment Pain Assessment: Faces Faces Pain Scale: Hurts little more Pain Descriptors / Indicators: Grimacing, Guarding Pain Intervention(s): Limited activity within patient's tolerance, Repositioned  Home Living                          Prior Function            PT Goals (current goals can now be found in the care plan section) Progress towards PT goals: Progressing toward goals    Frequency    Min 1X/week      PT Plan      Co-evaluation              AM-PAC PT "6 Clicks" Mobility   Outcome Measure  Help needed turning from your back to your side while in a flat bed without using bedrails?: A Lot Help needed moving from lying on your back to sitting on the side of a flat bed without using bedrails?: A Lot Help needed moving  to and from a bed to a chair (including a wheelchair)?: A Lot Help needed standing up from a chair using your arms (e.g., wheelchair or bedside chair)?: A Lot Help needed to walk in hospital room?: Total Help needed climbing 3-5 steps with a railing? : Total 6 Click Score: 10    End of Session   Activity Tolerance: Patient limited by fatigue;Other (comment) (limited by anxiety) Patient left: in bed;with call bell/phone within reach;with nursing/sitter in room (respiratory present) Nurse Communication: Mobility status PT Visit Diagnosis: Difficulty in walking, not elsewhere classified (R26.2)     Time: 1341-1415 PT Time Calculation (min) (ACUTE ONLY): 34 min  Charges:    $Therapeutic Activity: 23-37 mins PT General Charges $$ ACUTE PT VISIT: 1 Visit                     Olga Coaster PT, DPT 3:03 PM,09/02/23

## 2023-09-02 NOTE — Progress Notes (Signed)
 Pharmacy Antibiotic Note  Michelle [name] is a 63 y.o. female w/ PMH of asthma, COPD, CAD, HTN, HLD, chronic respiratory failure with tracheostomy admitted on 08/28/2023 with pneumonia.  Pharmacy has been consulted for vancomycin dosing.  Plan: start vancomycin 1500 mg IV x 1, then 1250 mg IV every 24 hours Goal AUC 400-550. Expected AUC: 490.1 SCr used: 0.80 mg/dL (rounded up)   Height: 5' (152.4 cm) Weight: 74.1 kg (163 lb 5.8 oz) IBW/kg (Calculated) : 45.5  Temp (24hrs), Avg:99 F (37.2 C), Min:98.9 F (37.2 C), Max:99 F (37.2 C)  Recent Labs  Lab 08/28/23 1846 08/29/23 0101 08/29/23 0307 08/30/23 0118 08/31/23 0529 09/01/23 0326 09/02/23 0434  WBC 15.6* 14.6* 14.2* 15.0* 10.6* 11.6*  --   CREATININE 0.65 0.64 0.71 0.61 0.65 0.65 0.65  LATICACIDVEN 1.6 1.7  --   --   --   --   --     Estimated Creatinine Clearance: 65.5 mL/min (by C-G formula based on SCr of 0.65 mg/dL).    Allergies  Allergen Reactions   Amoxicillin     Patient has tolerated cephalosporins in the past   Ativan [Lorazepam]     Makes anxiety worse     Antimicrobials this admission: 03/26 azithromycin >> 03/30 03/27 cefepime >>  03/31 vancomycin >>  Microbiology results: 03/26 BCx: NG final 03/27 UCx: MRSA, pseudomonas  03/27 Sputum: S aureus,  pseudomonas 03/27 MRSA PCR: negative  Thank you for allowing pharmacy to be a part of this patient's care.  Lowella Bandy 09/02/2023 8:28 AM

## 2023-09-02 NOTE — Progress Notes (Addendum)
 NAME:  Michelle [name], MRN:  [id number], DOB:  [birth year], LOS: 5 ADMISSION DATE:  08/28/2023, CONSULTATION DATE:  08/28/23 REFERRING MD:  Dr. Scotty Court, CHIEF COMPLAINT:  Respiratory Failure, AECOPD   Brief Pt Description / Synopsis:  63 y.o. 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 To review, patient has had 4-5 days of SOB, wheezing, and productive green sputum, low grade fevers.    Upon arrival to the ER patient was on 6 liters [place], Temp 97.9, HR 152, RR 24, BP 181/158. Michelle [name] was exchanged and patient placed on the ventilator. Cultures sent, ceftriaxone, azithromycin, solumedrol, and duonebs given. Labs revealed WBC 15.6, Lactic acid 1.6, Procalcitonin 0.16, K 3.4, glucose 277, Viral panel negative.  CTA thorax negative for PE, mucus plugging of right middle lobe and bilateral lower lobe mucous plugging with consolidations, and emphysema. CT abd/pelvis severe atherosclerotic plaque of the aorta and its branches, no acute pathology noted.   PCCM asked to admit for further workup and treatment.  Please see "Significant Hospital Events" section below for full detailed hospital course.   Pertinent  Medical History  COPD/emphysema/asthma Chronic  respiratory failure, trach placed 2019 HTN/HLD/CHF/CAD DM type 2 insulin dependent  Hep C Prior Tobacco and cocaine abuse Anxiety/Depression  Micro Data:  3/26: COVID/FLU/RSV PCR>> negative 3/26: Blood culture x2>> no growth 3/27: Urine>> Staphylococcus Aureus (resistant to Ciprofloxacin and Oxacillin) & Pseudomonas Aeruginosa (resistant to Ciprofloxacin and Gentamicin) 3/27: MRSA PCR>> negative 3/27: RVP>>negative 3/27: Strep pneumo urinary antigen>> negative 3/27: Legionella urinary antigen>> negative 3/29: Tracheal aspirate>>MRSA & Pseudomonas Aeruginosa  Antimicrobials:  Cefepime and azithromycin Anti-infectives (From admission, onward)    Start     Dose/Rate Route Frequency Ordered Stop   09/01/23 2200  azithromycin (ZITHROMAX) tablet 500 mg  Status:  Discontinued        500 mg Oral  Once 09/01/23 0807 09/01/23 0808   09/01/23 2200  azithromycin (ZITHROMAX) 500 mg in sodium chloride 0.9 % 250 mL IVPB        500 mg 250 mL/hr over 60 Minutes Intravenous Every 24 hours 09/01/23 0808 09/02/23 0005   08/29/23 2000  azithromycin (ZITHROMAX) 500 mg in sodium chloride 0.9 % 250 mL IVPB  Status:  Discontinued        500 mg 250 mL/hr over 60 Minutes Intravenous Every 24 hours 08/28/23 2223 09/01/23 0807   08/29/23 2000  cefTRIAXone (ROCEPHIN) 2 g in sodium chloride 0.9 % 100 mL IVPB  Status:  Discontinued        2 g 200 mL/hr over 30 Minutes Intravenous Every 24 hours 08/28/23 2223 08/29/23 0224   08/29/23 0315  ceFEPIme (MAXIPIME) 2 g in sodium chloride 0.9 % 100 mL IVPB        2 g 200 mL/hr over 30 Minutes Intravenous Every 8 hours 08/29/23 0224  08/28/23 1900  cefTRIAXone (ROCEPHIN) 2 g in sodium chloride 0.9 % 100 mL IVPB        2 g 200 mL/hr over 30 Minutes Intravenous Once 08/28/23 1845 08/28/23 2000   08/28/23 1900  azithromycin (ZITHROMAX) 500 mg in sodium chloride 0.9 % 250 mL IVPB        500 mg 250 mL/hr over 60 Minutes Intravenous  Once 08/28/23 1845 08/28/23 2107        Significant Hospital Events: Including procedures, antibiotic start and stop dates in addition to other pertinent events   3/26: Admit to ICU on home ventilator with acute on chronic respiratory failure. Cultures pending, broad spectrum antibiotics.  3/27: On minimal vent settings, with intermittent anxiety requesting to resume her home Klonopin.  Discussed with Dr. Belia Heman, will allow her to start diet.  Cultures still pending, continue broad spectrum antibiotics. 3/28: No acute events overnight, on minimal vent support.  Tracheal aspirate preliminary results with Gram + cocci 08/31/2023: No acute issues overnight.  Remains on full vent support.  FiO2 down to 35%.  Remains afebrile. 3/30: No acute issues overnight.  P.o. meds restarted 3/31: No acute issues overnight.  Remains on minimal home vent settings.  Tracheal aspirate and Urine cultures resulted with Staph Aureus and Pseudomonas, broaden ABX to include Vancomycin on top of Cefepime.  D/c steroids. Obtain Echocardiogram.   Interim History / Subjective:  As outlined above under "Significant Hospital Events" section   Objective   Blood pressure 120/70, pulse 61, temperature 99 F (37.2 C), temperature source Oral, resp. rate 16, height 5' (1.524 m), weight 74.1 kg, last menstrual period 03/06/2005, SpO2 100%.    Vent Mode: PRVC FiO2 (%):  [35 %] 35 % Set Rate:  [16 bmp] 16 bmp Vt Set:  [400 mL] 400 mL PEEP:  [5 cmH20] 5 cmH20 Plateau Pressure:  [19 cmH20] 19 cmH20   Intake/Output Summary (Last 24 hours) at 09/02/2023 0735 Last data filed at 09/02/2023 0500 Gross per 24 hour  Intake 670.11 ml  Output 951 ml  Net -280.89 ml   Filed Weights   08/31/23 0500 09/01/23 0423 09/02/23 0500  Weight: 74.4 kg 73.2 kg 74.1 kg    Examination: General: Acute on chronically ill appearing obese female, lying in bed, on mechanical ventilation via chronic trach, no acute distress.  Communicating by mouthing words and nodding HENT:  Atraumatic, normocephalic, neck supple, difficult to assess JVD due to body habitus Lungs: Normal work of breathing, bilateral breath sounds, diffuse rhonchi in anterior lung fields, no wheezing.   Cardiovascular: Regular rate and rhythm, S1-S2, no murmurs, rubs, gallops. Abdomen: Obese, soft, nontender, nondistended, no guarding or rebound tenderness, bowel sounds positive x 4 Extremities: Normal bulk and tone, no deformities, no edema, no cyanosis.  Contractures at the level of the ankle left greater than right. Neuro: Awake and alert, oriented x 4, moves all extremities to commands, no focal deficits GU: External female catheter in place, draining normally  Resolved Hospital Problem list   None  Assessment & Plan:   #Acute on Chronic Hypoxic & Hypercapnic Respiratory failure in the setting of ... #Acute COPD Exacerbation  #Staph Aureus & Pseudomonas Pneumonia #Mucus Plugging-improved PMHx: COPD/Emphysema/Asthma with chronic tracheostomy and home vent. Pseudomonas and Acinetobacter PNA  -Off all sedation -Full vent support with current settings. - Implement lung protective strategies -Plateau pressures less than 30 cm H20 -Wean FiO2 & PEEP as tolerated to maintain O2 sats 88 to 92% -Follow intermittent Chest X-ray & ABG  as needed -Spontaneous Breathing Trials when respiratory parameters met and mental status permits -Implement VAP Bundle -Bronchodilators, Pulmicort nebs, and Hypertonic saline nebs  -D/c Prednisone 3/31 -ABX with cefepime and Vancomycin  #MRSA & Pseudomonas Pneumonia #Staph Aureus & Pseudomonas UTI  -Remains afebrile -Monitor fever curve -Trend WBC's  -Follow cultures as above -Continue empiric Cefepime, add Vancomycin pending cultures & sensitivities -Check Echocardiogram to rule out vegetations given Staph aureus not normally causative agent for UTI  #Anxiety/Depression -Valium titrated down to 2.5 mg every 8 hours as needed -Continue outpatient Klonopin  as scheduled.     #Type 2 Diabetes Mellitus wearing insulin therapy: improved with pre-meal coverage. Last hemoglobin A1c was 9.4%. -Continue CBG's q4h with sliding scale insulin coverage - Target range of 140 to 180 -Premeal coverage with 4 units of regular insulin 3 times daily AC -Follow ICU Hypo/Hyperglycemia protocol -Continue home medications   #Hypertension Hx: Hyperlipidemia, CAD, CHF -Blood pressure is stable.  -Cardiac monitoring -MAP goal > 65, SBP < 160 -Resume home antihypertensives  #Chronic pain syndrome -Continue all home medications  Best Practice (right click and "Reselect all SmartList Selections" daily)   Diet/type: Regular consistency (see orders) DVT prophylaxis: LMWH GI prophylaxis: H2B Lines: N/A Foley:  N/A Code Status:  full code Last date of multidisciplinary goals of care discussion 3/31 at bedside.  3/31: Pt updated at bedside on plan of care.  Able to communicate effectively by mouthing words and can also write despite being on the vent  Labs   CBC: Recent Labs  Lab 08/28/23 1846 08/29/23 0101 08/29/23 0307 08/30/23 0118 08/31/23 0529 09/01/23 0326  WBC 15.6* 14.6* 14.2* 15.0* 10.6* 11.6*  NEUTROABS 10.4*  --   --   --   --   --   HGB 13.0 11.5* 11.2* 11.9* 11.3* 12.0  HCT 41.7 36.3 36.1 38.7 35.7* 38.0  MCV 83.9 84.0 84.1 85.4 83.2 83.3  PLT 332 257 270 312 332 337    Basic Metabolic Panel: Recent Labs  Lab 08/29/23 0307 08/30/23 0118 08/31/23 0529 09/01/23 0326 09/02/23 0434  NA 136 141 138 140 141  K 4.6 4.8 4.2 3.1* 4.0  CL 103 106 101 99 104  CO2 23 24 26 25 26   GLUCOSE 344* 252* 218* 151* 106*  BUN 10 13 22 23  25*  CREATININE 0.71 0.61 0.65 0.65 0.65  CALCIUM 8.3* 9.4 9.5 9.0 9.3  MG 2.0 2.2 2.2 2.0 2.2  PHOS 2.9 2.8 2.1* 3.4 2.4*   GFR: Estimated Creatinine Clearance: 65.5 mL/min (by C-G formula based on SCr of 0.65 mg/dL). Recent Labs  Lab 08/28/23 1846 08/29/23 0101 08/29/23 0307 08/30/23 0118  08/31/23 0529 09/01/23 0326  PROCALCITON 0.16  --   --   --   --   --   WBC 15.6* 14.6* 14.2* 15.0* 10.6* 11.6*  LATICACIDVEN 1.6 1.7  --   --   --   --     Liver Function Tests: Recent Labs  Lab 08/28/23 1846 09/02/23 0434  AST 23  --   ALT 19  --   ALKPHOS 94  --   BILITOT 0.6  --   PROT 8.4*  --   ALBUMIN 4.2 3.2*   No results for input(s): "LIPASE", "AMYLASE" in the last 168 hours. No results for input(s): "AMMONIA" in the last 168 hours.  ABG    Component Value Date/Time   PHART 7.31 (L) 09/30/2021 0500   PCO2ART 46 09/30/2021 0500   PO2ART 155 (H) 09/30/2021 0500  HCO3 24.8 08/29/2023 0307   ACIDBASEDEF 1.5 08/29/2023 0307   O2SAT 97.5 08/29/2023 0307     Coagulation Profile: Recent Labs  Lab 08/28/23 1846  INR 1.0    Cardiac Enzymes: No results for input(s): "CKTOTAL", "CKMB", "CKMBINDEX", "TROPONINI" in the last 168 hours.  HbA1C: Hemoglobin A1C  Date/Time Value Ref Range Status  06/29/2013 04:02 AM 6.0 4.2 - 6.3 % Final    Comment:    The American Diabetes Association recommends that a primary goal of therapy should be <7% and that physicians should reevaluate the treatment regimen in patients with HbA1c values consistently >8%.    Hgb A1c MFr Bld  Date/Time Value Ref Range Status  08/29/2023 01:01 AM 9.4 (H) 4.8 - 5.6 % Final    Comment:    (NOTE) Pre diabetes:          5.7%-6.4%  Diabetes:              >6.4%  Glycemic control for   <7.0% adults with diabetes   02/19/2023 04:33 AM 10.3 (H) 4.8 - 5.6 % Final    Comment:    (NOTE)         Prediabetes: 5.7 - 6.4         Diabetes: >6.4         Glycemic control for adults with diabetes: <7.0     CBG: Recent Labs  Lab 09/01/23 1104 09/01/23 1549 09/01/23 1932 09/02/23 0037 09/02/23 0405  GLUCAP 175* 338* 192* 221* 129*    Review of Systems:   Positives in BOLD: Gen: Denies fever, chills, weight change, fatigue, night sweats HEENT: Denies blurred vision, double vision,  hearing loss, tinnitus, sinus congestion, rhinorrhea, sore throat, neck stiffness, dysphagia PULM: Denies shortness of breath, cough, sputum production, hemoptysis, wheezing  CV: Denies chest pain, edema, orthopnea, paroxysmal nocturnal dyspnea, palpitations GI: Denies abdominal pain, nausea, vomiting, diarrhea, hematochezia, melena, constipation, change in bowel habits GU: Denies dysuria, hematuria, polyuria, oliguria, urethral discharge Endocrine: Denies hot or cold intolerance, polyuria, polyphagia or appetite change Derm: Denies rash, dry skin, scaling or peeling skin change Heme: Denies easy bruising, bleeding, bleeding gums Neuro: Denies headache, numbness, weakness, slurred speech, loss of memory or consciousness Extremities: Reports bilateral lower extremity pain  Past Medical History:  She,  has a past medical history of Allergy, Anxiety, Asthma, CHF (congestive heart failure) (HCC), Cocaine abuse (HCC), COPD (chronic obstructive pulmonary disease) (HCC), Coronary artery disease, Emphysema/COPD (HCC), Hepatitis C, High cholesterol, Hypertension, Pneumothorax, and Tobacco abuse.   Surgical History:   Past Surgical History:  Procedure Laterality Date   CARDIAC CATHETERIZATION     CHEST TUBE INSERTION     IR GASTROSTOMY TUBE MOD SED  10/31/2017   TRACHEOSTOMY TUBE PLACEMENT N/A 10/17/2017   Procedure: TRACHEOSTOMY;  Surgeon: Bud Face, MD;  Location: [facility] ORS;  Service: ENT;  Laterality: N/A;     Social History:   reports that she quit smoking about 6 years ago. Her smoking use included cigarettes. She started smoking about 47 years ago. She has a 4.1 pack-year smoking history. She has never used smokeless tobacco. She reports that she does not currently use drugs after having used the following drugs: "Crack" cocaine. She reports that she does not drink alcohol.   Family History:  Her family history includes Asthma in her mother; Breast cancer (age of onset: 20) in her  maternal aunt; CAD in her father; COPD in her sister; CVA in her father; Lung cancer in her mother; Stroke in  her father.   Allergies Allergies  Allergen Reactions   Amoxicillin     Patient has tolerated cephalosporins in the past   Ativan [Lorazepam]     Makes anxiety worse      Home Medications  Prior to Admission medications   Medication Sig Start Date End Date Taking? Authorizing Provider  acetaminophen (TYLENOL) 325 MG tablet Take 2 tablets (650 mg total) by mouth every 6 (six) hours as needed for mild pain (pain score 1-3) (or Fever >/= 101). 04/24/23   Loyce Dys, MD  albuterol (PROVENTIL) (2.5 MG/3ML) 0.083% nebulizer solution Take 3 mLs (2.5 mg total) by nebulization every 6 (six) hours as needed for wheezing or shortness of breath. 03/27/23   Salena Saner, MD  albuterol (VENTOLIN HFA) 108 (90 Base) MCG/ACT inhaler Inhale 2 puffs into the lungs every 6 (six) hours as needed for wheezing or shortness of breath. 04/02/22   Parrett, Virgel Bouquet, NP  amLODipine (NORVASC) 5 MG tablet Take 5 mg by mouth daily. 08/27/21   [provider]  aspirin 81 MG chewable tablet Chew 81 mg by mouth daily.     [provider]  azelastine (ASTELIN) 0.1 % nasal spray SMARTSIG:1-2 Spray(s) Both Nares Every 12 Hours PRN 09/22/20   [provider]  azithromycin (ZITHROMAX Z-PAK) 250 MG tablet Take 2 tablets on Day 1 and then 1 tablet daily till gone. 06/17/23   Erin Fulling, MD  benzonatate (TESSALON PERLES) 100 MG capsule Take 1-2 capsules (100-200 mg total) by mouth 3 (three) times daily as needed for cough. 02/22/23   Sunnie Nielsen, DO  Blood Glucose Monitoring Suppl ([employer] BLOOD GLUCOSE METER) w/Device KIT Use as directed to monitor blood glucose up to qid 02/22/23   Sunnie Nielsen, DO  cetirizine (ZYRTEC) 10 MG tablet Take 10 mg by mouth daily. 03/21/22   [provider]  clonazePAM (KLONOPIN) 0.5 MG tablet Take 0.5 mg by mouth in the morning, at noon, and at  bedtime.    [provider]  cloNIDine (CATAPRES - DOSED IN MG/24 HR) 0.1 mg/24hr patch Place 0.1 mg onto the skin once a week.    [provider]  D3-50 1.25 MG (50000 UT) capsule Take 50,000 Units by mouth once a week. 12/28/22   [provider]  docusate sodium (COLACE) 100 MG capsule Take 2 capsules (200 mg total) by mouth daily as needed for mild constipation. 04/24/23   Loyce Dys, MD  fluticasone (FLONASE) 50 MCG/ACT nasal spray Place 1 spray into both nostrils daily.    [provider]  guaiFENesin (MUCINEX) 600 MG 12 hr tablet Take 600 mg by mouth 2 (two) times daily.    [provider]  hydrOXYzine (VISTARIL) 25 MG capsule Take 25 mg by mouth every 6 (six) hours as needed. 10/01/22   [provider]  ipratropium-albuterol (DUONEB) 0.5-2.5 (3) MG/3ML SOLN Take 3 mLs by nebulization every 6 (six) hours as needed (for shortness of breath). 06/17/23   Erin Fulling, MD  LANTUS SOLOSTAR 100 UNIT/ML Solostar Pen Inject 10 Units into the skin at bedtime. 01/03/23   [provider]  levofloxacin (LEVAQUIN) 750 MG tablet Take 1 tablet (750 mg total) by mouth daily. 08/28/23   Erin Fulling, MD  mirtazapine (REMERON) 30 MG tablet Take 30 mg by mouth at bedtime. 01/19/21   [provider]  mupirocin ointment (BACTROBAN) 2 % Apply 1 Application topically daily. To inside nose and to affected area of axilla 05/21/22  Moye, [place], MD  naloxone [facility]) nasal spray 4 mg/0.1 mL Place 1 spray into the nose once. 10/22/22   [provider]  NOVOLOG FLEXPEN 100 UNIT/ML FlexPen Inject into the skin 3 (three) times daily with meals. Sliding scale mealtime insulin. 03/01/21   [provider]  nystatin ointment (MYCOSTATIN) Apply 1 Application topically 3 (three) times daily. 01/04/23   [provider]  oxyCODONE-acetaminophen (PERCOCET/ROXICET) 5-325 MG tablet Take 1 tablet by mouth 3 (three) times daily as needed for  pain. 09/07/21   [provider]  PARoxetine (PAXIL) 40 MG tablet Take 40 mg by mouth every morning. Give 30 mg x 1 week (1.5 tab of 20 mg) then start 40 mg daily. D/c 20 mg pills at that time.    [provider]  polyethylene glycol powder (GLYCOLAX/MIRALAX) 17 GM/SCOOP powder Take by mouth.    [provider]  predniSONE (DELTASONE) 20 MG tablet Take 1 tablet (20 mg total) by mouth daily with breakfast. 7 days 06/17/23   Erin Fulling, MD  predniSONE (DELTASONE) 20 MG tablet Take 1 tablet (20 mg total) by mouth daily with breakfast for 10 days. 10 days 08/28/23 09/07/23  Erin Fulling, MD  PULMICORT 0.5 MG/2ML nebulizer solution  02/13/21   [provider]  terbinafine (LAMISIL) 1 % cream Apply 1 application  topically 2 (two) times daily. To affected skin, for 1 week after resolution of rash. # 30 gm, 1 refill.    [provider]  TRULICITY 4.5 MG/0.5ML SOPN Inject 4.5 mg into the skin once a week. 09/28/21   [provider]  Vitamin D, Ergocalciferol, (DRISDOL) 1.25 MG (50000 UNIT) CAPS capsule Take 50,000 Units by mouth 2 (two) times a week.    [provider]  fluconazole (DIFLUCAN) 150 MG tablet Take 1 tablet day 1 repeat in 1 week 11/15/20   Neale Burly, [place], MD     Critical care time: 40 minutes    Harlon Ditty, AGACNP-BC [facility] Prefer epic messenger for cross cover needs If after hours, please call E-link

## 2023-09-02 NOTE — Progress Notes (Signed)
 Initial Nutrition Assessment  DOCUMENTATION CODES:   Obesity unspecified  INTERVENTION:   Ensure Enlive po TID, each supplement provides 350 kcal and 20 grams of protein.  Ice cream with meal trays   Vital Cuisine po TID, each supplement provides 520kcal and 22g of protein.   MVI po daily   Pt at high refeed risk; recommend monitor potassium, magnesium and phosphorus labs daily until stable  Daily weights   NUTRITION DIAGNOSIS:   Inadequate oral intake related to acute illness as evidenced by per patient/family report.  GOAL:   Patient will meet greater than or equal to 90% of their needs  MONITOR:   PO intake, Supplement acceptance, Labs, Weight trends, Skin, I & O's  REASON FOR ASSESSMENT:   Consult Poor PO  ASSESSMENT:   63 y/o female with PMHx of COPD, emphysema, HTN, CAD, hx cocaine use, asthma, DM, HLD, depression, anxiety, CHF, anxiety, hepatitis C, hx tracheostomy tube placement on 10/17/2017 on home vent, hx G-tube placement 10/31/2017 (now removed) who is admitted with acute on chronic hypoxic and hypercapnic respiratory failure in the setting of acute COPD exacerbation, pneumonia (staph aureus and pseudomonas on cultures), mucus plugging and UTI (staph aureus and pseudomonas on cultures).  Met with pt in room today. Pt is well known to this RD from numerous previous admissions. Pt generally with good appetite and oral intake but per RN report, pt's oral intake has been poor in hospital. Pt reports poor appetite and oral intake in hospital. Pt does usually drink Ensure Max (strawberry or chocolate) but reports that is has been upsetting her stomach. RD discussed with pt the importance of adequate nutrition needed to preserve lean muscle. Pt is willing to try some different supplements. Pt is likely at high refeed risk. May need to consider NGT placement and nutrition support if pt's oral intake does not improve; this was discussed with pt. Per chart, pt appears weight  stable at baseline.    Medications reviewed and include: lovenox, pepcid, insulin, remeron, MVI, Kphos, cefepime, vancomycin    Labs reviewed: K 4.0 wnl, BUN 25(H), P 2.4(L), Mg 2.2 wnl Wbc- 13.0(H) Cbgs- 160, 109, 129, 221 x 24 hrs  AIC 9.4(H)- 3/27  NUTRITION - FOCUSED PHYSICAL EXAM:  Flowsheet Row Most Recent Value  Orbital Region No depletion  Upper Arm Region No depletion  Thoracic and Lumbar Region No depletion  Buccal Region No depletion  Temple Region No depletion  Clavicle Bone Region No depletion  Clavicle and Acromion Bone Region No depletion  Scapular Bone Region No depletion  Dorsal Hand No depletion  Patellar Region No depletion  Anterior Thigh Region No depletion  Posterior Calf Region No depletion  Edema (RD Assessment) None  Hair Reviewed  Eyes Reviewed  Mouth Reviewed  Skin Reviewed  Nails Reviewed   Diet Order:   Diet Order             Diet heart healthy/carb modified Room service appropriate? Yes; Fluid consistency: Thin  Diet effective now                  EDUCATION NEEDS:   Education needs have been addressed  Skin:  Skin Assessment: Reviewed RN Assessment  Last BM:  3/30- type 5  Height:   Ht Readings from Last 1 Encounters:  08/28/23 5' (1.524 m)    Weight:   Wt Readings from Last 1 Encounters:  09/02/23 74.1 kg    Ideal Body Weight:  45.45 kg  BMI:  Body mass  index is 31.9 kg/m.  Estimated Nutritional Needs:   Kcal:  1600-1800kcal/day  Protein:  80-90g/day  Fluid:  1.4-1.6L/day  Betsey Holiday MS, RD, LDN If unable to be reached, please send secure chat to "RD inpatient" available from 8:00a-4:00p daily

## 2023-09-02 NOTE — Plan of Care (Signed)
   Problem: Activity: Goal: Ability to tolerate increased activity will improve Outcome: Progressing   Problem: Respiratory: Goal: Ability to maintain a clear airway and adequate ventilation will improve Outcome: Progressing   Problem: Role Relationship: Goal: Method of communication will improve Outcome: Progressing   Problem: Education: Goal: Knowledge of General Education information will improve Description: Including pain rating scale, medication(s)/side effects and non-pharmacologic comfort measures Outcome: Progressing

## 2023-09-02 NOTE — Consult Note (Signed)
 PHARMACY CONSULT NOTE - ELECTROLYTES  Pharmacy Consult for Electrolyte Monitoring and Replacement   Recent Labs: Height: 5' (152.4 cm) Weight: 73.2 kg (161 lb 6 oz) IBW/kg (Calculated) : 45.5 Estimated Creatinine Clearance: 65.1 mL/min (by C-G formula based on SCr of 0.65 mg/dL). Potassium (mmol/L)  Date Value  09/02/2023 4.0  07/11/2014 4.6   Magnesium (mg/dL)  Date Value  13/01/6577 2.2  10/31/2013 2.6 (H)   Calcium (mg/dL)  Date Value  46/96/2952 9.3   Calcium, Total (mg/dL)  Date Value  84/13/2440 8.9   Albumin (g/dL)  Date Value  03/31/2535 3.2 (L)  07/09/2014 3.7   Phosphorus (mg/dL)  Date Value  64/40/3474 2.4 (L)  10/31/2013 4.6   Sodium (mmol/L)  Date Value  09/02/2023 141  07/11/2014 139    Assessment  Michelle [name] is a 63 y.o. female presenting with SOB, wheezing, and productive green sputum. Pharmacy has been consulted to monitor and replace electrolytes while under CCM care.  Goal of Therapy: Electrolytes WNL  Plan:  ---250 mg po phosphorous (contains phosphorus 8 mMol, potassium 1.1 mEq) ---F/u with AM labs.   Thank you for allowing pharmacy to be a part of this patient's care.  Lowella Bandy, PharmD Clinical Pharmacist 09/02/2023 7:00 AM

## 2023-09-02 NOTE — TOC Progression Note (Signed)
 Transition of Care [facility]) - Progression Note    Patient Details  Name: Michelle [name] MRN: [id number] Date of Birth: May 04, 1961  Transition of Care [facility]) CM/SW Contact  Garret Reddish, RN Phone Number: 09/02/2023, 12:34 PM  Clinical Narrative:    Chart reviewed. Noted that patient continues to be on full vent support and off sedation.  Noted that patient's Tracheal aspirate/ Urine cultures grow staphylococcus aureus/ pseudomonas.  Patient was started on IV Vancomycin and still remains IV Cefepime.    TOC will continue to follow for discharge planning.     Expected Discharge Plan: Home/Self Care Barriers to Discharge: No Barriers Identified  Expected Discharge Plan and Services   Discharge Planning Services: CM Consult Post Acute Care Choice:  (Patient has Private duty nursing) Living arrangements for the past 2 months: Apartment                   DME Agency:  (Patient has home vent and trach supplies via [employer])                   Social Determinants of Health (SDOH) Interventions SDOH Screenings   Food Insecurity: Patient Unable To Answer (08/30/2023)  Housing: Patient Unable To Answer (08/30/2023)  Transportation Needs: Patient Unable To Answer (08/30/2023)  Utilities: Patient Unable To Answer (08/30/2023)  Financial Resource Strain: Medium Risk (06/29/2017)  Physical Activity: Inactive (06/29/2017)  Social Connections: Unknown (06/29/2017)  Stress: Stress Concern Present (06/29/2017)  Tobacco Use: Medium Risk (08/28/2023)    Readmission Risk Interventions    04/19/2023    2:10 PM 02/22/2023    1:47 PM 10/03/2021    3:58 PM  Readmission Risk Prevention Plan  Transportation Screening Complete Complete Complete  PCP or Specialist Appt within 3-5 Days Complete Complete Complete  HRI or Home Care Consult Complete Complete   Social Work Consult for Recovery Care Planning/Counseling Complete Complete Complete  Palliative Care Screening Complete Complete Not Applicable   Medication Review Oceanographer) Complete Complete Complete

## 2023-09-02 NOTE — Progress Notes (Signed)
 Patient called nurse into room. Patient feeling short of breath. Oxygen saturation at 98%. Heartrate 130bpm. Patient's blood pressure reading 241/130. Cuff repositioned. Recheck at 246/130. Patient given PRN labetolol according to parameters. Patient's blood pressure reached 125/78, heart rate at 82 after 7.5mg  given. Did not continue with remaining dose.

## 2023-09-03 ENCOUNTER — Inpatient Hospital Stay (HOSPITAL_COMMUNITY)
Admit: 2023-09-03 | Discharge: 2023-09-03 | Disposition: A | Discharge status: Home / Self Care | Attending: Pulmonary Disease | Admitting: Pulmonary Disease

## 2023-09-03 DIAGNOSIS — J449 Chronic obstructive pulmonary disease, unspecified: Secondary | ICD-10-CM

## 2023-09-03 DIAGNOSIS — R918 Other nonspecific abnormal finding of lung field: Secondary | ICD-10-CM

## 2023-09-03 DIAGNOSIS — B9562 Methicillin resistant Staphylococcus aureus infection as the cause of diseases classified elsewhere: Secondary | ICD-10-CM

## 2023-09-03 DIAGNOSIS — E1165 Type 2 diabetes mellitus with hyperglycemia: Secondary | ICD-10-CM | POA: Diagnosis not present

## 2023-09-03 DIAGNOSIS — J9622 Acute and chronic respiratory failure with hypercapnia: Secondary | ICD-10-CM | POA: Diagnosis not present

## 2023-09-03 DIAGNOSIS — B965 Pseudomonas (aeruginosa) (mallei) (pseudomallei) as the cause of diseases classified elsewhere: Secondary | ICD-10-CM

## 2023-09-03 DIAGNOSIS — R0603 Acute respiratory distress: Secondary | ICD-10-CM

## 2023-09-03 DIAGNOSIS — Z87891 Personal history of nicotine dependence: Secondary | ICD-10-CM

## 2023-09-03 DIAGNOSIS — J9621 Acute and chronic respiratory failure with hypoxia: Secondary | ICD-10-CM | POA: Diagnosis not present

## 2023-09-03 DIAGNOSIS — T17998A Other foreign object in respiratory tract, part unspecified causing other injury, initial encounter: Secondary | ICD-10-CM

## 2023-09-03 DIAGNOSIS — J189 Pneumonia, unspecified organism: Secondary | ICD-10-CM | POA: Diagnosis not present

## 2023-09-03 LAB — CBC
HCT: 40.1 % (ref 36.0–46.0)
Hemoglobin: 12.7 g/dL (ref 12.0–15.0)
MCH: 26.5 pg (ref 26.0–34.0)
MCHC: 31.7 g/dL (ref 30.0–36.0)
MCV: 83.5 fL (ref 80.0–100.0)
Platelets: 341 10*3/uL (ref 150–400)
RBC: 4.8 MIL/uL (ref 3.87–5.11)
RDW: 14.6 % (ref 11.5–15.5)
WBC: 16.1 10*3/uL — ABNORMAL HIGH (ref 4.0–10.5)
nRBC: 0 % (ref 0.0–0.2)

## 2023-09-03 LAB — CULTURE, RESPIRATORY W GRAM STAIN

## 2023-09-03 LAB — RENAL FUNCTION PANEL
Albumin: 3.4 g/dL — ABNORMAL LOW (ref 3.5–5.0)
Anion gap: 8 (ref 5–15)
BUN: 27 mg/dL — ABNORMAL HIGH (ref 8–23)
CO2: 27 mmol/L (ref 22–32)
Calcium: 9.2 mg/dL (ref 8.9–10.3)
Chloride: 106 mmol/L (ref 98–111)
Creatinine, Ser: 0.64 mg/dL (ref 0.44–1.00)
GFR, Estimated: >60 mL/min (ref >60)
Glucose, Bld: 81 mg/dL (ref 70–99)
Phosphorus: 3.4 mg/dL (ref 2.5–4.6)
Potassium: 4.1 mmol/L (ref 3.5–5.1)
Sodium: 141 mmol/L (ref 135–145)

## 2023-09-03 LAB — ECHOCARDIOGRAM COMPLETE
AR max vel: 2.63 cm2
AV Area VTI: 2.53 cm2
AV Area mean vel: 2.3 cm2
AV Mean grad: 2 mmHg
AV Peak grad: 3.5 mmHg
Ao pk vel: 0.93 m/s
Area-P 1/2: 3.24 cm2
Est EF: >55
Height: 60 in
MV VTI: 2 cm2
S' Lateral: 1.65 cm
Weight: 2613.77 [oz_av]

## 2023-09-03 LAB — MAGNESIUM: Magnesium: 2 mg/dL (ref 1.7–2.4)

## 2023-09-03 LAB — GLUCOSE, CAPILLARY
Glucose-Capillary: 112 mg/dL — ABNORMAL HIGH (ref 70–99)
Glucose-Capillary: 113 mg/dL — ABNORMAL HIGH (ref 70–99)
Glucose-Capillary: 122 mg/dL — ABNORMAL HIGH (ref 70–99)
Glucose-Capillary: 159 mg/dL — ABNORMAL HIGH (ref 70–99)
Glucose-Capillary: 208 mg/dL — ABNORMAL HIGH (ref 70–99)
Glucose-Capillary: 71 mg/dL (ref 70–99)

## 2023-09-03 MED ORDER — MAGNESIUM HYDROXIDE 400 MG/5ML PO SUSP
30.0000 mL | Freq: Once | ORAL | Status: AC
  Administered 2023-09-03: 30 mL via ORAL
  Filled 2023-09-03: qty 30

## 2023-09-03 MED ORDER — INSULIN GLARGINE-YFGN 100 UNIT/ML ~~LOC~~ SOLN
12.0000 [IU] | Freq: Every day | SUBCUTANEOUS | Status: DC
Start: 2023-09-03 — End: 2023-09-05
  Administered 2023-09-03 – 2023-09-05 (×3): 12 [IU] via SUBCUTANEOUS
  Filled 2023-09-03 (×3): qty 0.12

## 2023-09-03 NOTE — Plan of Care (Signed)
  Problem: Nutrition: Goal: Adequate nutrition will be maintained Outcome: Not Progressing   Problem: Coping: Goal: Level of anxiety will decrease Outcome: Progressing   Problem: Elimination: Goal: Will not experience complications related to bowel motility Outcome: Progressing Goal: Will not experience complications related to urinary retention Outcome: Progressing   Problem: Pain Managment: Goal: General experience of comfort will improve and/or be controlled Outcome: Progressing   Problem: Safety: Goal: Ability to remain free from injury will improve Outcome: Progressing   Problem: Skin Integrity: Goal: Risk for impaired skin integrity will decrease Outcome: Progressing

## 2023-09-03 NOTE — Progress Notes (Signed)
 NAME:  Michelle [name], MRN:  [id number], DOB:  May 26, 1961, LOS: 6 ADMISSION DATE:  08/28/2023, CONSULTATION DATE:  08/28/23 REFERRING MD:  Dr. Scotty Court, CHIEF COMPLAINT:  Respiratory Failure, AECOPD   Brief Pt Description / Synopsis:  63 y.o. 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 To review, patient has had 4-5 days of SOB, wheezing, and productive green sputum, low grade fevers.    Upon arrival to the ER patient was on 6 liters [place], Temp 97.9, HR 152, RR 24, BP 181/158. [name] was exchanged and patient placed on the ventilator. Cultures sent, ceftriaxone, azithromycin, solumedrol, and duonebs given. Labs revealed WBC 15.6, Lactic acid 1.6, Procalcitonin 0.16, K 3.4, glucose 277, Viral panel negative.  CTA thorax negative for PE, mucus plugging of right middle lobe and bilateral lower lobe mucous plugging with consolidations, and emphysema. CT abd/pelvis severe atherosclerotic plaque of the aorta and its branches, no acute pathology noted.   PCCM asked to admit for further workup and treatment.  Please see "Significant Hospital Events" section below for full detailed hospital course.   Pertinent  Medical History  COPD/emphysema/asthma Chronic respiratory failure, trach placed 2019 HTN/HLD/CHF/CAD DM type 2  insulin dependent  Hep C Prior Tobacco and cocaine abuse Anxiety/Depression  Micro Data:  3/26: COVID/FLU/RSV PCR>> negative 3/26: Blood culture x2>> no growth 3/27: Urine>> Staphylococcus Aureus (resistant to Ciprofloxacin and Oxacillin) & Pseudomonas Aeruginosa (resistant to Ciprofloxacin and Gentamicin) 3/27: MRSA PCR>> negative 3/27: RVP>>negative 3/27: Strep pneumo urinary antigen>> negative 3/27: Legionella urinary antigen>> negative 3/29: Tracheal aspirate>>MRSA & Pseudomonas Aeruginosa  Antimicrobials:  Cefepime and azithromycin Anti-infectives (From admission, onward)    Start     Dose/Rate Route Frequency Ordered Stop   09/03/23 0000  vancomycin (VANCOREADY) IVPB 1250 mg/250 mL        1,250 mg 166.7 mL/hr over 90 Minutes Intravenous Every 24 hours 09/02/23 1215 09/07/23 2359   09/02/23 1300  ceFEPIme (MAXIPIME) 2 g in sodium chloride 0.9 % 100 mL IVPB        2 g 200 mL/hr over 30 Minutes Intravenous Every 8 hours 09/02/23 1217 09/05/23 0459   09/02/23 1000  vancomycin (VANCOREADY) IVPB 1500 mg/300 mL        1,500 mg 150 mL/hr over 120 Minutes Intravenous  Once 09/02/23 0829 09/02/23 1900   09/01/23 2200  azithromycin (ZITHROMAX) tablet 500 mg  Status:  Discontinued        500 mg Oral  Once 09/01/23 0807 09/01/23 0808   09/01/23 2200  azithromycin (ZITHROMAX) 500 mg in sodium chloride 0.9 % 250 mL IVPB        500 mg 250 mL/hr over 60 Minutes Intravenous Every 24 hours 09/01/23 0808 09/02/23 0005   08/29/23 2000  azithromycin (ZITHROMAX) 500 mg in sodium chloride 0.9 % 250 mL IVPB  Status:  Discontinued        500 mg 250  mL/hr over 60 Minutes Intravenous Every 24 hours 08/28/23 2223 09/01/23 0807   08/29/23 2000  cefTRIAXone (ROCEPHIN) 2 g in sodium chloride 0.9 % 100 mL IVPB  Status:  Discontinued        2 g 200 mL/hr over 30 Minutes Intravenous Every 24 hours 08/28/23 2223 08/29/23 0224   08/29/23 0315  ceFEPIme (MAXIPIME) 2 g in sodium chloride 0.9 % 100 mL IVPB   Status:  Discontinued        2 g 200 mL/hr over 30 Minutes Intravenous Every 8 hours 08/29/23 0224 09/02/23 1217   08/28/23 1900  cefTRIAXone (ROCEPHIN) 2 g in sodium chloride 0.9 % 100 mL IVPB        2 g 200 mL/hr over 30 Minutes Intravenous Once 08/28/23 1845 08/28/23 2000   08/28/23 1900  azithromycin (ZITHROMAX) 500 mg in sodium chloride 0.9 % 250 mL IVPB        500 mg 250 mL/hr over 60 Minutes Intravenous  Once 08/28/23 1845 08/28/23 2107       Significant Hospital Events: Including procedures, antibiotic start and stop dates in addition to other pertinent events   3/26: Admit to ICU on home ventilator with acute on chronic respiratory failure. Cultures pending, broad spectrum antibiotics.  3/27: On minimal vent settings, with intermittent anxiety requesting to resume her home Klonopin.  Discussed with Dr. Belia Heman, will allow her to start diet.  Cultures still pending, continue broad spectrum antibiotics. 3/28: No acute events overnight, on minimal vent support.  Tracheal aspirate preliminary results with Gram + cocci 08/31/2023: No acute issues overnight.  Remains on full vent support.  FiO2 down to 35%.  Remains afebrile. 3/30: No acute issues overnight.  P.o. meds restarted 3/31: No acute issues overnight.  Remains on minimal home vent settings.  Tracheal aspirate and Urine cultures resulted with Staph Aureus and Pseudomonas, broaden ABX to include Vancomycin on top of Cefepime.  D/c steroids. Obtain Echocardiogram. 4/1: No acute events noted overnight, remains on home vent settings. Afebrile, hemodynamically stable, no vasopressors.   No complaints overnight, reports appetite improved.  Pseudomonas found to be multi-drug resistant, consult ID.   Interim History / Subjective:  As outlined above under "Significant Hospital Events" section   Objective   Blood pressure (!) 122/58, pulse 81, temperature 97.9 F (36.6 C), temperature source Oral, resp. rate 14, height 5' (1.524 m), weight  74.1 kg, last menstrual period 03/06/2005, SpO2 100%.    Vent Mode: PRVC FiO2 (%):  [35 %] 35 % Set Rate:  [16 bmp] 16 bmp Vt Set:  [400 mL] 400 mL PEEP:  [5 cmH20] 5 cmH20   Intake/Output Summary (Last 24 hours) at 09/03/2023 0941 Last data filed at 09/03/2023 0500 Gross per 24 hour  Intake 531.75 ml  Output 1100 ml  Net -568.25 ml   Filed Weights   08/31/23 0500 09/01/23 0423 09/02/23 0500  Weight: 74.4 kg 73.2 kg 74.1 kg    Examination: General: Acute on chronically ill appearing obese female, lying in bed, on mechanical ventilation via chronic trach, no acute distress.  Communicating by mouthing words and nodding HENT: Atraumatic, normocephalic, neck supple, difficult to assess JVD due to body habitus Lungs: Normal work of breathing, bilateral breath sounds, diffuse rhonchi in anterior lung fields, no wheezing.   Cardiovascular: Regular rate and rhythm, S1-S2, no murmurs, rubs, gallops. Abdomen: Obese, soft, nontender, nondistended, no guarding or rebound tenderness, bowel sounds positive x 4 Extremities: Normal bulk and tone, no deformities, no edema, no  cyanosis.  Contractures at the level of the ankle left greater than right. Neuro: Awake and alert, oriented x 4, moves all extremities to commands, no focal deficits GU: External female catheter in place, draining normally  Resolved Hospital Problem list   None  Assessment & Plan:   #Acute on Chronic Hypoxic & Hypercapnic Respiratory failure in the setting of ... #Acute COPD Exacerbation  #Staph Aureus & Pseudomonas Pneumonia #Mucus Plugging-improved PMHx: COPD/Emphysema/Asthma with chronic tracheostomy and home vent. Pseudomonas and Acinetobacter PNA  -Off all sedation -Full vent support with current settings. - Implement lung protective strategies -Plateau pressures less than 30 cm H20 -Wean FiO2 & PEEP as tolerated to maintain O2 sats 88 to 92% -Follow intermittent Chest X-ray & ABG as needed -Spontaneous Breathing  Trials when respiratory parameters met and mental status permits -Implement VAP Bundle -Bronchodilators, Pulmicort nebs, and Hypertonic saline nebs  -D/c Prednisone 3/31 -ABX with cefepime and Vancomycin  #MRSA & Pseudomonas Pneumonia #MRSA & Pseudomonas UTI  -Remains afebrile -Monitor fever curve -Trend WBC's  -Follow cultures as above -Continue empiric Cefepime, add Vancomycin pending cultures & sensitivities ~ concern for mult-drug Pseudomonas, consult ID -Echocardiogram pending to rule out vegetations given Staph aureus not normally causative agent for UTI  #Anxiety/Depression -Valium titrated down to 2.5 mg every 8 hours as needed -Continue outpatient Klonopin as scheduled.     #Type 2 Diabetes Mellitus wearing insulin therapy: improved with pre-meal coverage. Last hemoglobin A1c was 9.4%. -Continue CBG's q4h with sliding scale insulin coverage - Target range of 140 to 180 -Premeal coverage with 4 units of regular insulin 3 times daily AC -Follow ICU Hypo/Hyperglycemia protocol -Continue home medications   #Hypertension Hx: Hyperlipidemia, CAD, CHF -Blood pressure is stable.  -Cardiac monitoring -MAP goal > 65, SBP < 160 -Resume home antihypertensives  #Chronic pain syndrome -Continue all home medications  Best Practice (right click and "Reselect all SmartList Selections" daily)   Diet/type: Regular consistency (see orders) DVT prophylaxis: LMWH GI prophylaxis: H2B Lines: N/A Foley:  N/A Code Status:  full code Last date of multidisciplinary goals of care discussion 4/1 at bedside.  4/1: Pt updated at bedside on plan of care.  Able to communicate effectively by mouthing words and can also write despite being on the vent  Labs   CBC: Recent Labs  Lab 08/28/23 1846 08/29/23 0101 08/30/23 0118 08/31/23 0529 09/01/23 0326 09/02/23 1159 09/03/23 0413  WBC 15.6*   < > 15.0* 10.6* 11.6* 13.0* 16.1*  NEUTROABS 10.4*  --   --   --   --   --   --   HGB 13.0   <  > 11.9* 11.3* 12.0 12.8 12.7  HCT 41.7   < > 38.7 35.7* 38.0 40.6 40.1  MCV 83.9   < > 85.4 83.2 83.3 82.2 83.5  PLT 332   < > 312 332 337 349 341   < > = values in this interval not displayed.    Basic Metabolic Panel: Recent Labs  Lab 08/30/23 0118 08/31/23 0529 09/01/23 0326 09/02/23 0434 09/03/23 0413  NA 141 138 140 141 141  K 4.8 4.2 3.1* 4.0 4.1  CL 106 101 99 104 106  CO2 24 26 25 26 27   GLUCOSE 252* 218* 151* 106* 81  BUN 13 22 23  25* 27*  CREATININE 0.61 0.65 0.65 0.65 0.64  CALCIUM 9.4 9.5 9.0 9.3 9.2  MG 2.2 2.2 2.0 2.2 2.0  PHOS 2.8 2.1* 3.4 2.4* 3.4   GFR: Estimated Creatinine  Clearance: 65.5 mL/min (by C-G formula based on SCr of 0.64 mg/dL). Recent Labs  Lab 08/28/23 1846 08/29/23 0101 08/29/23 0307 08/31/23 0529 09/01/23 0326 09/02/23 1159 09/03/23 0413  PROCALCITON 0.16  --   --   --   --   --   --   WBC 15.6* 14.6*   < > 10.6* 11.6* 13.0* 16.1*  LATICACIDVEN 1.6 1.7  --   --   --   --   --    < > = values in this interval not displayed.    Liver Function Tests: Recent Labs  Lab 08/28/23 1846 09/02/23 0434 09/03/23 0413  AST 23  --   --   ALT 19  --   --   ALKPHOS 94  --   --   BILITOT 0.6  --   --   PROT 8.4*  --   --   ALBUMIN 4.2 3.2* 3.4*   No results for input(s): "LIPASE", "AMYLASE" in the last 168 hours. No results for input(s): "AMMONIA" in the last 168 hours.  ABG    Component Value Date/Time   PHART 7.31 (L) 09/30/2021 0500   PCO2ART 46 09/30/2021 0500   PO2ART 155 (H) 09/30/2021 0500   HCO3 24.8 08/29/2023 0307   ACIDBASEDEF 1.5 08/29/2023 0307   O2SAT 97.5 08/29/2023 0307     Coagulation Profile: Recent Labs  Lab 08/28/23 1846  INR 1.0    Cardiac Enzymes: No results for input(s): "CKTOTAL", "CKMB", "CKMBINDEX", "TROPONINI" in the last 168 hours.  HbA1C: Hemoglobin A1C  Date/Time Value Ref Range Status  06/29/2013 04:02 AM 6.0 4.2 - 6.3 % Final    Comment:    The American Diabetes Association recommends  that a primary goal of therapy should be <7% and that physicians should reevaluate the treatment regimen in patients with HbA1c values consistently >8%.    Hgb A1c MFr Bld  Date/Time Value Ref Range Status  08/29/2023 01:01 AM 9.4 (H) 4.8 - 5.6 % Final    Comment:    (NOTE) Pre diabetes:          5.7%-6.4%  Diabetes:              >6.4%  Glycemic control for   <7.0% adults with diabetes   02/19/2023 04:33 AM 10.3 (H) 4.8 - 5.6 % Final    Comment:    (NOTE)         Prediabetes: 5.7 - 6.4         Diabetes: >6.4         Glycemic control for adults with diabetes: <7.0     CBG: Recent Labs  Lab 09/02/23 1520 09/02/23 1917 09/02/23 2337 09/03/23 0312 09/03/23 0741  GLUCAP 348* 248* 166* 71 122*    Review of Systems:   Positives in BOLD: Gen: Denies fever, chills, weight change, fatigue, night sweats HEENT: Denies blurred vision, double vision, hearing loss, tinnitus, sinus congestion, rhinorrhea, sore throat, neck stiffness, dysphagia PULM: Denies shortness of breath, cough, sputum production, hemoptysis, wheezing  CV: Denies chest pain, edema, orthopnea, paroxysmal nocturnal dyspnea, palpitations GI: Denies abdominal pain, nausea, vomiting, diarrhea, hematochezia, melena, constipation, change in bowel habits GU: Denies dysuria, hematuria, polyuria, oliguria, urethral discharge Endocrine: Denies hot or cold intolerance, polyuria, polyphagia or appetite change Derm: Denies rash, dry skin, scaling or peeling skin change Heme: Denies easy bruising, bleeding, bleeding gums Neuro: Denies headache, numbness, weakness, slurred speech, loss of memory or consciousness Extremities: Reports bilateral lower extremity pain  Past Medical  History:  She,  has a past medical history of Allergy, Anxiety, Asthma, CHF (congestive heart failure) (HCC), Cocaine abuse (HCC), COPD (chronic obstructive pulmonary disease) (HCC), Coronary artery disease, Emphysema/COPD (HCC), Hepatitis C, High  cholesterol, Hypertension, Pneumothorax, and Tobacco abuse.   Surgical History:   Past Surgical History:  Procedure Laterality Date   CARDIAC CATHETERIZATION     CHEST TUBE INSERTION     IR GASTROSTOMY TUBE MOD SED  10/31/2017   TRACHEOSTOMY TUBE PLACEMENT N/A 10/17/2017   Procedure: TRACHEOSTOMY;  Surgeon: Bud Face, MD;  Location: [facility] ORS;  Service: ENT;  Laterality: N/A;     Social History:   reports that she quit smoking about 6 years ago. Her smoking use included cigarettes. She started smoking about 47 years ago. She has a 4.1 pack-year smoking history. She has never used smokeless tobacco. She reports that she does not currently use drugs after having used the following drugs: "Crack" cocaine. She reports that she does not drink alcohol.   Family History:  Her family history includes Asthma in her mother; Breast cancer (age of onset: 62) in her maternal aunt; CAD in her father; COPD in her sister; CVA in her father; Lung cancer in her mother; Stroke in her father.   Allergies Allergies  Allergen Reactions   Lorazepam     Makes anxiety worse  Other Reaction(s): Makes anxiety worse   Lisinopril Cough   Amoxicillin     Patient has tolerated cephalosporins in the past     Home Medications  Prior to Admission medications   Medication Sig Start Date End Date Taking? Authorizing Provider  acetaminophen (TYLENOL) 325 MG tablet Take 2 tablets (650 mg total) by mouth every 6 (six) hours as needed for mild pain (pain score 1-3) (or Fever >/= 101). 04/24/23   Loyce Dys, MD  albuterol (PROVENTIL) (2.5 MG/3ML) 0.083% nebulizer solution Take 3 mLs (2.5 mg total) by nebulization every 6 (six) hours as needed for wheezing or shortness of breath. 03/27/23   Salena Saner, MD  albuterol (VENTOLIN HFA) 108 (90 Base) MCG/ACT inhaler Inhale 2 puffs into the lungs every 6 (six) hours as needed for wheezing or shortness of breath. 04/02/22   Parrett, Virgel Bouquet, NP  amLODipine  (NORVASC) 5 MG tablet Take 5 mg by mouth daily. 08/27/21   [provider]  aspirin 81 MG chewable tablet Chew 81 mg by mouth daily.     [provider]  azelastine (ASTELIN) 0.1 % nasal spray SMARTSIG:1-2 Spray(s) Both Nares Every 12 Hours PRN 09/22/20   [provider]  azithromycin (ZITHROMAX Z-PAK) 250 MG tablet Take 2 tablets on Day 1 and then 1 tablet daily till gone. 06/17/23   Erin Fulling, MD  benzonatate (TESSALON PERLES) 100 MG capsule Take 1-2 capsules (100-200 mg total) by mouth 3 (three) times daily as needed for cough. 02/22/23   Sunnie Nielsen, DO  Blood Glucose Monitoring Suppl ([employer] BLOOD GLUCOSE METER) w/Device KIT Use as directed to monitor blood glucose up to qid 02/22/23   Sunnie Nielsen, DO  cetirizine (ZYRTEC) 10 MG tablet Take 10 mg by mouth daily. 03/21/22   [provider]  clonazePAM (KLONOPIN) 0.5 MG tablet Take 0.5 mg by mouth in the morning, at noon, and at bedtime.    [provider]  cloNIDine (CATAPRES - DOSED IN MG/24 HR) 0.1 mg/24hr patch Place 0.1 mg onto the skin once a week.    [provider]  D3-50 1.25 MG (50000 UT) capsule  Take 50,000 Units by mouth once a week. 12/28/22   [provider]  docusate sodium (COLACE) 100 MG capsule Take 2 capsules (200 mg total) by mouth daily as needed for mild constipation. 04/24/23   Loyce Dys, MD  fluticasone (FLONASE) 50 MCG/ACT nasal spray Place 1 spray into both nostrils daily.    [provider]  guaiFENesin (MUCINEX) 600 MG 12 hr tablet Take 600 mg by mouth 2 (two) times daily.    [provider]  hydrOXYzine (VISTARIL) 25 MG capsule Take 25 mg by mouth every 6 (six) hours as needed. 10/01/22   [provider]  ipratropium-albuterol (DUONEB) 0.5-2.5 (3) MG/3ML SOLN Take 3 mLs by nebulization every 6 (six) hours as needed (for shortness of breath). 06/17/23   Erin Fulling, MD  LANTUS SOLOSTAR 100 UNIT/ML Solostar Pen Inject 10  Units into the skin at bedtime. 01/03/23   [provider]  levofloxacin (LEVAQUIN) 750 MG tablet Take 1 tablet (750 mg total) by mouth daily. 08/28/23   Erin Fulling, MD  mirtazapine (REMERON) 30 MG tablet Take 30 mg by mouth at bedtime. 01/19/21   [provider]  mupirocin ointment (BACTROBAN) 2 % Apply 1 Application topically daily. To inside nose and to affected area of axilla 05/21/22   Moye, [place], MD  naloxone [facility]) nasal spray 4 mg/0.1 mL Place 1 spray into the nose once. 10/22/22   [provider]  NOVOLOG FLEXPEN 100 UNIT/ML FlexPen Inject into the skin 3 (three) times daily with meals. Sliding scale mealtime insulin. 03/01/21   [provider]  nystatin ointment (MYCOSTATIN) Apply 1 Application topically 3 (three) times daily. 01/04/23   [provider]  oxyCODONE-acetaminophen (PERCOCET/ROXICET) 5-325 MG tablet Take 1 tablet by mouth 3 (three) times daily as needed for pain. 09/07/21   [provider]  PARoxetine (PAXIL) 40 MG tablet Take 40 mg by mouth every morning. Give 30 mg x 1 week (1.5 tab of 20 mg) then start 40 mg daily. D/c 20 mg pills at that time.    [provider]  polyethylene glycol powder (GLYCOLAX/MIRALAX) 17 GM/SCOOP powder Take by mouth.    [provider]  predniSONE (DELTASONE) 20 MG tablet Take 1 tablet (20 mg total) by mouth daily with breakfast. 7 days 06/17/23   Erin Fulling, MD  predniSONE (DELTASONE) 20 MG tablet Take 1 tablet (20 mg total) by mouth daily with breakfast for 10 days. 10 days 08/28/23 09/07/23  Erin Fulling, MD  PULMICORT 0.5 MG/2ML nebulizer solution  02/13/21   [provider]  terbinafine (LAMISIL) 1 % cream Apply 1 application  topically 2 (two) times daily. To affected skin, for 1 week after resolution of rash. # 30 gm, 1 refill.    [provider]  TRULICITY 4.5 MG/0.5ML SOPN Inject 4.5 mg into the skin once a week. 09/28/21   [provider]  Vitamin  D, Ergocalciferol, (DRISDOL) 1.25 MG (50000 UNIT) CAPS capsule Take 50,000 Units by mouth 2 (two) times a week.    [provider]  fluconazole (DIFLUCAN) 150 MG tablet Take 1 tablet day 1 repeat in 1 week 11/15/20   Neale Burly, [place], MD     Critical care time: 40 minutes    Harlon Ditty, AGACNP-BC [facility] Prefer epic messenger for cross cover needs If after hours, please call E-link

## 2023-09-03 NOTE — Progress Notes (Addendum)
 Occupational Therapy Treatment Patient Details Name: Michelle [name] MRN: [id number] DOB: 1960-11-01 Today's Date: 09/03/2023   History of present illness Pt is a 63 year old female with a PMH significant for MRSA and Pseudomonas pneumonia, Atypical pneumonia, COPD/Emphysema/Asthma, 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, HTN, HLD, CHF, CAD s/p STEMI s/ stent 2009, DM type 2 w/ insulin dependence, Hepatitis C, Former tobacco and cocaine use, Anxiety/Depression, and Obesity that presented to ED with COPD exacerbation.   OT comments  Chart reviewed to date, attempted to see patient around 9 am this morning, she reports she wants me to come back later and in agreement for OOB activity. When tx session attempted at a later time, pt declines out of bed despite education/encouragement. Pt participated in therapeutic exercise to facilitate improved ADL performance. Grooming tasks with SET UP.VSS pre/post session. Nurse notified of pt status post session. OT will continue to follow acutely to facilitate optimal ADL performance.       If plan is discharge home, recommend the following:  A lot of help with walking and/or transfers;A lot of help with bathing/dressing/bathroom   Equipment Recommendations  Wheelchair cushion (measurements OT);BSC/3in1;Hoyer lift;Hospital bed    Recommendations for Other Services      Precautions / Restrictions Precautions Precautions: Fall Recall of Precautions/Restrictions: Intact       Mobility Bed Mobility               General bed mobility comments: pt repositioned herself with use of bed controls with supervision, semi supine>unsupported long sit with supervision multiple attempts    Transfers                   General transfer comment: pt declined OOB on two different attempts on this date     Balance                                           ADL either performed or assessed with clinical judgement   ADL Overall ADL's :  Needs assistance/impaired                                       General ADL Comments: pt declined OOB on this date on two attempts, after therex she mouths"I have to poop" as therapist is getting bedpan for her she says "I am already going" and requests time to finish; nure notified SET UP for grooming tasks in bed    Extremity/Trunk Assessment              Vision       Perception     Praxis     Communication Communication Communication: Impaired Factors Affecting Communication: Trach/intubated   Cognition Arousal: Alert Behavior During Therapy: Anxious Cognition: No apparent impairments             OT - Cognition Comments: Pt mouthing over trach                 Following commands: Intact        Cueing   Cueing Techniques: Verbal cues, Tactile cues  Exercises Other Exercises Other Exercises: SLR 10x 2 sets, semi supine>long sit 10x 2 sets Other Exercises: edu re: role of OT, role of rehab, importance of progressing mobility    Shoulder Instructions  General Comments      Pertinent Vitals/ Pain       Pain Assessment Pain Assessment: CPOT Facial Expression: Relaxed, neutral Body Movements: Absence of movements Muscle Tension: Tense, rigid Compliance with ventilator (intubated pts.): Tolerating ventilator or movement Vocalization (extubated pts.): N/A CPOT Total: 1 Pain Descriptors / Indicators: Grimacing Pain Intervention(s): Monitored during session, Limited activity within patient's tolerance, Repositioned  Home Living                                          Prior Functioning/Environment              Frequency  Min 2X/week        Progress Toward Goals  OT Goals(current goals can now be found in the care plan section)  Progress towards OT goals: Progressing toward goals  Acute Rehab OT Goals Time For Goal Achievement: 09/12/23  Plan      Co-evaluation                  AM-PAC OT "6 Clicks" Daily Activity     Outcome Measure   Help from another person eating meals?: None Help from another person taking care of personal grooming?: None Help from another person toileting, which includes using toliet, bedpan, or urinal?: A Lot Help from another person bathing (including washing, rinsing, drying)?: A Lot Help from another person to put on and taking off regular upper body clothing?: A Little Help from another person to put on and taking off regular lower body clothing?: A Lot 6 Click Score: 17    End of Session    OT Visit Diagnosis: Other abnormalities of gait and mobility (R26.89);Muscle weakness (generalized) (M62.81)   Activity Tolerance Other (comment) (appears self limiting)   Patient Left in bed;with call bell/phone within reach   Nurse Communication Mobility status;Other (comment) (pt attempting to have BM)        Time: 8416-6063 OT Time Calculation (min): 13 min  Charges: OT General Charges $OT Visit: 1 Visit OT Treatments $Therapeutic Exercise: 8-22 mins  Oleta Mouse, OTD OTR/L  09/03/23, 3:05 PM

## 2023-09-03 NOTE — Consult Note (Signed)
 PHARMACY CONSULT NOTE - ELECTROLYTES  Pharmacy Consult for Electrolyte Monitoring and Replacement   Recent Labs: Height: 5' (152.4 cm) Weight: 74.1 kg (163 lb 5.8 oz) IBW/kg (Calculated) : 45.5 Estimated Creatinine Clearance: 65.5 mL/min (by C-G formula based on SCr of 0.64 mg/dL). Potassium (mmol/L)  Date Value  09/03/2023 4.1  07/11/2014 4.6   Magnesium (mg/dL)  Date Value  82/95/6213 2.0  10/31/2013 2.6 (H)   Calcium (mg/dL)  Date Value  08/65/7846 9.2   Calcium, Total (mg/dL)  Date Value  96/29/5284 8.9   Albumin (g/dL)  Date Value  13/24/4010 3.4 (L)  07/09/2014 3.7   Phosphorus (mg/dL)  Date Value  27/25/3664 3.4  10/31/2013 4.6   Sodium (mmol/L)  Date Value  09/03/2023 141  07/11/2014 139    Assessment  Michelle [name] is a 63 y.o. female presenting with SOB, wheezing, and productive green sputum. Pharmacy has been consulted to monitor and replace electrolytes while under CCM care.  Goal of Therapy: Electrolytes WNL  Plan:  ---no electrolyte replacement warranted today ---F/u with AM labs.   Thank you for allowing pharmacy to be a part of this patient's care.  Lowella Bandy, PharmD Clinical Pharmacist 09/03/2023 6:54 AM

## 2023-09-03 NOTE — Plan of Care (Signed)
  Problem: Respiratory: Goal: Ability to maintain a clear airway and adequate ventilation will improve Outcome: Progressing   Problem: Education: Goal: Knowledge of General Education information will improve Description: Including pain rating scale, medication(s)/side effects and non-pharmacologic comfort measures Outcome: Progressing   Problem: Health Behavior/Discharge Planning: Goal: Ability to manage health-related needs will improve Outcome: Progressing   Problem: Clinical Measurements: Goal: Will remain free from infection Outcome: Progressing Goal: Diagnostic test results will improve Outcome: Progressing Goal: Respiratory complications will improve Outcome: Progressing

## 2023-09-03 NOTE — Progress Notes (Signed)
*  PRELIMINARY RESULTS* Echocardiogram 2D Echocardiogram has been performed.  Carolyne Fiscal 09/03/2023, 1:11 PM

## 2023-09-03 NOTE — Consult Note (Signed)
 NAME: Michelle [name]  DOB: 02/13/1961  MRN: [id number]  Date/Time: 09/03/2023 3:08 PM  REQUESTING PROVIDER: Dr.Dgalyi Subjective:  REASON FOR CONSULT: MRSA /pseudomonas in tracheal culture ? Michelle [name] is a 63 y.o. 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, DM, HTN, treated in the past for pseudomonal resp infection presented to ED brought in by EMS with fever , cough and sob In the ED vitals 181/158 HR 152 Temp 97.9 RR 24   Latest Reference Range & Units 08/28/23  WBC 4.0 - 10.5 K/uL 15.6 (H)  Hemoglobin 12.0 - 15.0 g/dL 37.1  HCT 06.2 - 69.4 % 41.7  Platelets 150 - 400 K/uL 332  Creatinine 0.44 - 1.00 mg/dL 8.54   CT chest rt ML and b/l LL with mucous plugging and surrounding infiltrate  Started on ceftriaxone and azithromycin- admitted to ICU, ceftriaxone changed to cefepime- she completed 5 days of aihromycin Has been on cefepime since 3/26  3/27 tracheal aspirate culture MRSA and pseudomonas I am asked to see patient as pseudomonas is sensitive to gentamicin. R to cipro and ceftazidime- no other antibiotics like cefepime, zosyn , meropenem is reported  I am asked to see the patient for the same Michelle [name] says she has abdominal pain Some diarrhea Breathing better Not coughing much Passing urine well   Past Medical History:  Diagnosis Date   Allergy    Anxiety    Asthma    CHF (congestive heart failure) (HCC)    Cocaine abuse (HCC)    COPD (chronic obstructive pulmonary disease) (HCC)    Coronary artery disease    Emphysema/COPD (HCC)    Hepatitis C    High cholesterol    Hypertension    Pneumothorax    Tobacco abuse     Past Surgical History:  Procedure Laterality Date   CARDIAC CATHETERIZATION     CHEST TUBE INSERTION     IR GASTROSTOMY TUBE MOD SED  10/31/2017   TRACHEOSTOMY TUBE PLACEMENT N/A 10/17/2017   Procedure: TRACHEOSTOMY;  Surgeon: Bud Face, MD;  Location: [facility] ORS;  Service: ENT;  Laterality: N/A;     Social History   Socioeconomic History   Marital status: Single    Spouse name: Not on file   Number of children: Not on file   Years of education: Not on file   Highest education level: Not on file  Occupational History   Not on file  Tobacco Use   Smoking status: Former    Current packs/day: 0.00    Average packs/day: 0.1 packs/day for 41.0 years (4.1 ttl pk-yrs)    Types: Cigarettes    Start date: 06/09/1976    Quit date: 06/09/2017    Years since quitting: 6.2   Smokeless tobacco: Never  Vaping Use   Vaping status: Never Used  Substance and Sexual Activity   Alcohol use: No   Drug use: Not Currently    Types: "Crack" cocaine    Comment: Past cocaine use.  Quit 04/2017   Sexual activity: Not Currently    Partners: Male    Birth control/protection: Post-menopausal  Other Topics Concern   Not on file  Social History Narrative   Not on file   Social Drivers of Health   Financial Resource Strain: Medium Risk (06/29/2017)   Overall Financial Resource Strain (CARDIA)    Difficulty of Paying Living Expenses: Somewhat hard  Food Insecurity: Patient Unable To Answer (08/30/2023)   Hunger Vital Sign    Worried About  Running Out of Food in the Last Year: Patient unable to answer    Ran Out of Food in the Last Year: Patient unable to answer  Transportation Needs: Patient Unable To Answer (08/30/2023)   PRAPARE - Transportation    Lack of Transportation (Medical): Patient unable to answer    Lack of Transportation (Non-Medical): Patient unable to answer  Physical Activity: Inactive (06/29/2017)   Exercise Vital Sign    Days of Exercise per Week: 0 days    Minutes of Exercise per Session: 0 min  Stress: Stress Concern Present (06/29/2017)   [employer] of Occupational Health - Occupational Stress Questionnaire    Feeling of Stress : To some extent  Social Connections: Unknown (06/29/2017)   Social Connection and Isolation Panel [NHANES]    Frequency of Communication with  Friends and Family: Patient declined    Frequency of Social Gatherings with Friends and Family: Patient declined    Attends Religious Services: Patient declined    Database administrator or Organizations: Patient declined    Attends Banker Meetings: Patient declined    Marital Status: Patient declined  Intimate Partner Violence: Patient Unable To Answer (08/30/2023)   Humiliation, Afraid, Rape, and Kick questionnaire    Fear of Current or Ex-Partner: Patient unable to answer    Emotionally Abused: Patient unable to answer    Physically Abused: Patient unable to answer    Sexually Abused: Patient unable to answer    Family History  Problem Relation Age of Onset   Lung cancer Mother    Asthma Mother    CVA Father    CAD Father    Stroke Father    Breast cancer Maternal Aunt 26   COPD Sister    Allergies  Allergen Reactions   Lorazepam     Makes anxiety worse  Other Reaction(s): Makes anxiety worse   Lisinopril Cough   Amoxicillin     Patient has tolerated cephalosporins in the past   I? Current Facility-Administered Medications  Medication Dose Route Frequency Provider Last Rate Last Admin   amLODipine (NORVASC) tablet 5 mg  5 mg Oral Daily Erin Fulling, MD   5 mg at 09/03/23 0834   budesonide (PULMICORT) nebulizer solution 0.25 mg  0.25 mg Nebulization BID Dahlia Byes, NP   0.25 mg at 09/03/23 0748   ceFEPIme (MAXIPIME) 2 g in sodium chloride 0.9 % 100 mL IVPB  2 g Intravenous Q8H Dgayli, Lianne Bushy, MD   Stopped at 09/03/23 1322   Chlorhexidine Gluconate Cloth 2 % PADS 6 each  6 each Topical Nightly Erin Fulling, MD       clonazePAM (KLONOPIN) tablet 0.5 mg  0.5 mg Oral TID Erin Fulling, MD   0.5 mg at 09/03/23 0835   diazepam (VALIUM) tablet 2 mg  2 mg Oral Q6H PRN Erin Fulling, MD   2 mg at 09/03/23 0731   docusate sodium (COLACE) capsule 100 mg  100 mg Oral BID PRN Dahlia Byes, NP       enoxaparin (LOVENOX) injection 40 mg  40 mg Subcutaneous Q24H  Mila Merry A, RPH   40 mg at 09/03/23 0834   famotidine (PEPCID) tablet 20 mg  20 mg Oral BID Otelia Sergeant, RPH   20 mg at 09/03/23 0835   feeding supplement (ENSURE ENLIVE / ENSURE PLUS) liquid 237 mL  237 mL Oral TID BM Dgayli, Lianne Bushy, MD   237 mL at 09/03/23 1339   fluticasone (FLONASE) 50 MCG/ACT nasal spray 1  spray  1 spray Each Nare Daily Tukov-Yual, Magdalene S, NP   1 spray at 09/03/23 0836   guaiFENesin-dextromethorphan (ROBITUSSIN DM) 100-10 MG/5ML syrup 15 mL  15 mL Oral Q4H PRN Dahlia Byes, NP   15 mL at 09/03/23 0553   insulin aspart (novoLOG) injection 0-20 Units  0-20 Units Subcutaneous Q4H Dahlia Byes, NP   4 Units at 09/03/23 1251   insulin aspart (novoLOG) injection 4 Units  4 Units Subcutaneous TID WC Tukov-Yual, Magdalene S, NP   4 Units at 09/03/23 0833   insulin glargine-yfgn (SEMGLEE) injection 12 Units  12 Units Subcutaneous Daily Dgayli, Lianne Bushy, MD   12 Units at 09/03/23 1023   ipratropium-albuterol (DUONEB) 0.5-2.5 (3) MG/3ML nebulizer solution 3 mL  3 mL Nebulization Q6H Dahlia Byes, NP   3 mL at 09/03/23 1437   ipratropium-albuterol (DUONEB) 0.5-2.5 (3) MG/3ML nebulizer solution 3 mL  3 mL Nebulization Q4H PRN Harlon Ditty D, NP   3 mL at 09/02/23 1043   labetalol (NORMODYNE) injection 10 mg  10 mg Intravenous Q10 min PRN Dahlia Byes, NP   7.5 mg at 09/02/23 1050   melatonin tablet 5 mg  5 mg Oral QHS PRN Dahlia Byes, NP   5 mg at 09/02/23 2126   mirtazapine (REMERON) tablet 30 mg  30 mg Oral QHS Erin Fulling, MD   30 mg at 09/02/23 2127   morphine (PF) 2 MG/ML injection 1-2 mg  1-2 mg Intravenous Q4H PRN Harlon Ditty D, NP   2 mg at 09/02/23 1903   multivitamin with minerals tablet 1 tablet  1 tablet Oral Daily Erin Fulling, MD   1 tablet at 09/03/23 0835   ondansetron (ZOFRAN) injection 4 mg  4 mg Intravenous Q6H PRN Dahlia Byes, NP       Oral care mouth rinse  15 mL Mouth Rinse 4 times per day Erin Fulling, MD   15 mL at  09/03/23 1132   Oral care mouth rinse  15 mL Mouth Rinse PRN Erin Fulling, MD       Oral care mouth rinse  15 mL Mouth Rinse PRN Dahlia Byes, NP       oxyCODONE-acetaminophen (PERCOCET/ROXICET) 5-325 MG per tablet 1 tablet  1 tablet Oral Q6H PRN Dahlia Byes, NP   1 tablet at 09/02/23 2031   polyethylene glycol (MIRALAX / GLYCOLAX) packet 17 g  17 g Oral Daily PRN Dahlia Byes, NP       vancomycin (VANCOREADY) IVPB 1250 mg/250 mL  1,250 mg Intravenous Q24H Lowella Bandy, [facility]   Stopped at 09/03/23 0215     Abtx:  Anti-infectives (From admission, onward)    Start     Dose/Rate Route Frequency Ordered Stop   09/03/23 0000  vancomycin (VANCOREADY) IVPB 1250 mg/250 mL        1,250 mg 166.7 mL/hr over 90 Minutes Intravenous Every 24 hours 09/02/23 1215 09/07/23 2359   09/02/23 1300  ceFEPIme (MAXIPIME) 2 g in sodium chloride 0.9 % 100 mL IVPB        2 g 200 mL/hr over 30 Minutes Intravenous Every 8 hours 09/02/23 1217 09/05/23 0459   09/02/23 1000  vancomycin (VANCOREADY) IVPB 1500 mg/300 mL        1,500 mg 150 mL/hr over 120 Minutes Intravenous  Once 09/02/23 0829 09/02/23 1900   09/01/23 2200  azithromycin (ZITHROMAX) tablet 500 mg  Status:  Discontinued        500 mg Oral  Once 09/01/23 0807 09/01/23 1610  09/01/23 2200  azithromycin (ZITHROMAX) 500 mg in sodium chloride 0.9 % 250 mL IVPB        500 mg 250 mL/hr over 60 Minutes Intravenous Every 24 hours 09/01/23 0808 09/02/23 0005   08/29/23 2000  azithromycin (ZITHROMAX) 500 mg in sodium chloride 0.9 % 250 mL IVPB  Status:  Discontinued        500 mg 250 mL/hr over 60 Minutes Intravenous Every 24 hours 08/28/23 2223 09/01/23 0807   08/29/23 2000  cefTRIAXone (ROCEPHIN) 2 g in sodium chloride 0.9 % 100 mL IVPB  Status:  Discontinued        2 g 200 mL/hr over 30 Minutes Intravenous Every 24 hours 08/28/23 2223 08/29/23 0224   08/29/23 0315  ceFEPIme (MAXIPIME) 2 g in sodium chloride 0.9 % 100 mL IVPB  Status:   Discontinued        2 g 200 mL/hr over 30 Minutes Intravenous Every 8 hours 08/29/23 0224 09/02/23 1217   08/28/23 1900  cefTRIAXone (ROCEPHIN) 2 g in sodium chloride 0.9 % 100 mL IVPB        2 g 200 mL/hr over 30 Minutes Intravenous Once 08/28/23 1845 08/28/23 2000   08/28/23 1900  azithromycin (ZITHROMAX) 500 mg in sodium chloride 0.9 % 250 mL IVPB        500 mg 250 mL/hr over 60 Minutes Intravenous  Once 08/28/23 1845 08/28/23 2107       REVIEW OF SYSTEMS:  Const: negative fever, negative chills, negative weight loss Eyes: negative diplopia or visual changes, negative eye pain ENT: negative coryza, negative sore throat Resp: negative cough, hemoptysis, dyspnea Cards: negative for chest pain, palpitations, lower extremity edema GU: negative for frequency, dysuria and hematuria GI: Negative for abdominal pain, diarrhea, bleeding, constipation Skin: negative for rash and pruritus Heme: negative for easy bruising and gum/nose bleeding MS: negative for myalgias, arthralgias, back pain and muscle weakness Neurolo:negative for headaches, dizziness, vertigo, memory problems  Psych: negative for feelings of anxiety, depression  Endocrine: negative for thyroid, diabetes Allergy/Immunology- negative for any medication or food allergies ? Pertinent Positives include : Objective:  VITALS:  BP (!) 147/83   Pulse 86   Temp 98.3 F (36.8 C) (Oral)   Resp 17   Ht 5' (1.524 m)   Wt 74.1 kg   LMP 03/06/2005 (Approximate)   SpO2 100%   BMI 31.90 kg/m    PHYSICAL EXAM:  General: Alert, cooperative, no distress, appears stated age.  Head: Normocephalic, without obvious abnormality, atraumatic. Eyes: Conjunctivae clear, anicteric sclerae. Pupils are equal ENT Nares normal. No drainage or sinus tenderness. Lips, mucosa, and tongue normal. No Thrush Neck: tracheostomy  Lungs: b/l air entry Heart: Tchycardia. Abdomen: Soft, non-tender,not distended. Bowel sounds normal. No  masses Extremities: atraumatic, no cyanosis. No edema. No clubbing Skin: No rashes or lesions. Or bruising Lymph: Cervical, supraclavicular normal. Neurologic: Grossly non-focal Pertinent Labs Lab Results CBC    Component Value Date/Time   WBC 16.1 (H) 09/03/2023 0413   RBC 4.80 09/03/2023 0413   HGB 12.7 09/03/2023 0413   HGB 12.4 07/11/2014 0434   HCT 40.1 09/03/2023 0413   HCT 38.9 07/11/2014 0434   PLT 341 09/03/2023 0413   PLT 251 07/11/2014 0434   MCV 83.5 09/03/2023 0413   MCV 87 07/11/2014 0434   MCH 26.5 09/03/2023 0413   MCHC 31.7 09/03/2023 0413   RDW 14.6 09/03/2023 0413   RDW 13.9 07/11/2014 0434   LYMPHSABS 3.5 08/28/2023 1846   LYMPHSABS  0.9 (L) 07/11/2014 0434   MONOABS 1.2 (H) 08/28/2023 1846   MONOABS 1.0 (H) 07/11/2014 0434   EOSABS 0.3 08/28/2023 1846   EOSABS 0.0 07/11/2014 0434   BASOSABS 0.1 08/28/2023 1846   BASOSABS 0.0 07/11/2014 0434       Latest Ref Rng & Units 09/03/2023    4:13 AM 09/02/2023    4:34 AM 09/01/2023    3:26 AM  CMP  Glucose 70 - 99 mg/dL 81  829  562   BUN 8 - 23 mg/dL 27  25  23    Creatinine 0.44 - 1.00 mg/dL 1.30  8.65  7.84   Sodium 135 - 145 mmol/L 141  141  140   Potassium 3.5 - 5.1 mmol/L 4.1  4.0  3.1   Chloride 98 - 111 mmol/L 106  104  99   CO2 22 - 32 mmol/L 27  26  25    Calcium 8.9 - 10.3 mg/dL 9.2  9.3  9.0       Microbiology: Recent Results (from the past 240 hours)  Resp panel by RT-PCR (RSV, Flu A&B, Covid) Anterior Nasal Swab     Status: None   Collection Time: 08/28/23  6:46 PM   Specimen: Anterior Nasal Swab  Result Value Ref Range Status   SARS Coronavirus 2 by RT PCR NEGATIVE NEGATIVE Final    Comment: (NOTE) SARS-CoV-2 target nucleic acids are NOT DETECTED.  The SARS-CoV-2 RNA is generally detectable in upper respiratory specimens during the acute phase of infection. The lowest concentration of SARS-CoV-2 viral copies this assay can detect is 138 copies/mL. A negative result does not preclude  SARS-Cov-2 infection and should not be used as the sole basis for treatment or other patient management decisions. A negative result may occur with  improper specimen collection/handling, submission of specimen other than nasopharyngeal swab, presence of viral mutation(s) within the areas targeted by this assay, and inadequate number of viral copies(<138 copies/mL). A negative result must be combined with clinical observations, patient history, and epidemiological information. The expected result is Negative.  Fact Sheet for Patients:  BloggerCourse.com  Fact Sheet for Healthcare Providers:  SeriousBroker.it  This test is no t yet approved or cleared by the [place] FDA and  has been authorized for detection and/or diagnosis of SARS-CoV-2 by FDA under an Emergency Use Authorization (EUA). This EUA will remain  in effect (meaning this test can be used) for the duration of the COVID-19 declaration under Section 564(b)(1) of the Act, 21 U.S.C.section 360bbb-3(b)(1), unless the authorization is terminated  or revoked sooner.       Influenza A by PCR NEGATIVE NEGATIVE Final   Influenza B by PCR NEGATIVE NEGATIVE Final    Comment: (NOTE) The Xpert Xpress SARS-CoV-2/FLU/RSV plus assay is intended as an aid in the diagnosis of influenza from Nasopharyngeal swab specimens and should not be used as a sole basis for treatment. Nasal washings and aspirates are unacceptable for Xpert Xpress SARS-CoV-2/FLU/RSV testing.  Fact Sheet for Patients: BloggerCourse.com  Fact Sheet for Healthcare Providers: SeriousBroker.it  This test is not yet approved or cleared by the [place] FDA and has been authorized for detection and/or diagnosis of SARS-CoV-2 by FDA under an Emergency Use Authorization (EUA). This EUA will remain in effect (meaning this test can be used) for the duration of  the COVID-19 declaration under Section 564(b)(1) of the Act, 21 U.S.C. section 360bbb-3(b)(1), unless the authorization is terminated or revoked.     Resp Syncytial Virus by PCR  NEGATIVE NEGATIVE Final    Comment: (NOTE) Fact Sheet for Patients: BloggerCourse.com  Fact Sheet for Healthcare Providers: SeriousBroker.it  This test is not yet approved or cleared by the [place] FDA and has been authorized for detection and/or diagnosis of SARS-CoV-2 by FDA under an Emergency Use Authorization (EUA). This EUA will remain in effect (meaning this test can be used) for the duration of the COVID-19 declaration under Section 564(b)(1) of the Act, 21 U.S.C. section 360bbb-3(b)(1), unless the authorization is terminated or revoked.  Performed at [facility], [address] [place], [place] [address]   Blood Culture (routine x 2)     Status: None   Collection Time: 08/28/23  6:46 PM   Specimen: BLOOD  Result Value Ref Range Status   Specimen Description BLOOD BLOOD RIGHT HAND  Final   Special Requests   Final    BOTTLES DRAWN AEROBIC AND ANAEROBIC Blood Culture results may not be optimal due to an inadequate volume of blood received in culture bottles   Culture   Final    NO GROWTH 5 DAYS Performed at [facility], [address] [place], [place] [address]    Report Status 09/02/2023 FINAL  Final  Blood Culture (routine x 2)     Status: None   Collection Time: 08/28/23  6:51 PM   Specimen: BLOOD  Result Value Ref Range Status   Specimen Description BLOOD BLOOD LEFT HAND  Final   Special Requests   Final    BOTTLES DRAWN AEROBIC AND ANAEROBIC Blood Culture results may not be optimal due to an inadequate volume of blood received in culture bottles   Culture   Final    NO GROWTH 5 DAYS Performed at [facility], [address], [place], [place] [address]    Report Status 09/02/2023 FINAL  Final   MRSA Next Gen by PCR, Nasal     Status: None   Collection Time: 08/29/23 12:19 AM   Specimen: Nasal Mucosa; Nasal Swab  Result Value Ref Range Status   MRSA by PCR Next Gen NOT DETECTED NOT DETECTED Final    Comment: (NOTE) The GeneXpert MRSA Assay (FDA approved for NASAL specimens only), is one component of a comprehensive MRSA colonization surveillance program. It is not intended to diagnose MRSA infection nor to guide or monitor treatment for MRSA infections. Test performance is not FDA approved in patients less than 53 years old. Performed at [facility], [address], [place], [place] [address]   Urine Culture     Status: Abnormal   Collection Time: 08/29/23 12:27 AM   Specimen: Urine, Random  Result Value Ref Range Status   Specimen Description   Final    URINE, RANDOM Performed at [facility], [address] [place], [place] [address]    Special Requests   Final    NONE Reflexed from [id number] Performed at [facility], [address] [place], [place] [address]    Culture (A)  Final    >=100,000 COLONIES/mL STAPHYLOCOCCUS AUREUS 30,000 COLONIES/mL PSEUDOMONAS AERUGINOSA    Report Status 09/01/2023 FINAL  Final   Organism ID, Bacteria STAPHYLOCOCCUS AUREUS (A)  Final   Organism ID, Bacteria PSEUDOMONAS AERUGINOSA (A)  Final      Susceptibility   Pseudomonas aeruginosa - MIC*    CEFTAZIDIME 2 SENSITIVE Sensitive     CIPROFLOXACIN 2 RESISTANT Resistant     GENTAMICIN 8 INTERMEDIATE Intermediate     IMIPENEM 1 SENSITIVE Sensitive  PIP/TAZO <=4 SENSITIVE Sensitive ug/mL    * 30,000 COLONIES/mL PSEUDOMONAS AERUGINOSA   Staphylococcus aureus - MIC*    CIPROFLOXACIN >=8 RESISTANT Resistant     GENTAMICIN <=0.5 SENSITIVE Sensitive     NITROFURANTOIN <=16 SENSITIVE Sensitive     OXACILLIN >=4 RESISTANT Resistant     TETRACYCLINE <=1 SENSITIVE Sensitive     VANCOMYCIN <=0.5 SENSITIVE Sensitive     TRIMETH/SULFA <=10 SENSITIVE Sensitive      RIFAMPIN <=0.5 SENSITIVE Sensitive     Inducible Clindamycin NEGATIVE Sensitive     LINEZOLID 2 SENSITIVE Sensitive     * >=100,000 COLONIES/mL STAPHYLOCOCCUS AUREUS  Respiratory (~20 pathogens) panel by PCR     Status: None   Collection Time: 08/29/23  8:04 AM   Specimen: Nasopharyngeal Swab; Respiratory  Result Value Ref Range Status   Adenovirus NOT DETECTED NOT DETECTED Final   Coronavirus 229E NOT DETECTED NOT DETECTED Final    Comment: (NOTE) The Coronavirus on the Respiratory Panel, DOES NOT test for the novel  Coronavirus (2019 nCoV)    Coronavirus HKU1 NOT DETECTED NOT DETECTED Final   Coronavirus NL63 NOT DETECTED NOT DETECTED Final   Coronavirus OC43 NOT DETECTED NOT DETECTED Final   Metapneumovirus NOT DETECTED NOT DETECTED Final   Rhinovirus / Enterovirus NOT DETECTED NOT DETECTED Final   Influenza A NOT DETECTED NOT DETECTED Final   Influenza B NOT DETECTED NOT DETECTED Final   Parainfluenza Virus 1 NOT DETECTED NOT DETECTED Final   Parainfluenza Virus 2 NOT DETECTED NOT DETECTED Final   Parainfluenza Virus 3 NOT DETECTED NOT DETECTED Final   Parainfluenza Virus 4 NOT DETECTED NOT DETECTED Final   Respiratory Syncytial Virus NOT DETECTED NOT DETECTED Final   Bordetella pertussis NOT DETECTED NOT DETECTED Final   Bordetella Parapertussis NOT DETECTED NOT DETECTED Final   Chlamydophila pneumoniae NOT DETECTED NOT DETECTED Final   Mycoplasma pneumoniae NOT DETECTED NOT DETECTED Final    Comment: Performed at [facility], [address] [place], [place] [address]  Culture, Respiratory w Gram Stain     Status: None   Collection Time: 08/29/23  3:19 PM   Specimen: Tracheal Aspirate; Respiratory  Result Value Ref Range Status   Specimen Description   Final    TRACHEAL ASPIRATE Performed at [facility], [address] [place], [place] [address]    Special Requests   Final    NONE Performed at [facility], [address]  [place], [place] [address]    Gram Stain   Final    FEW WBC PRESENT,BOTH PMN AND MONONUCLEAR FEW GRAM POSITIVE COCCI IN PAIRS IN CLUSTERS Performed at [facility], [address], [place], [place] [address]    Culture   Final    MODERATE METHICILLIN RESISTANT STAPHYLOCOCCUS AUREUS FEW PSEUDOMONAS AERUGINOSA    Report Status 09/03/2023 FINAL  Final   Organism ID, Bacteria METHICILLIN RESISTANT STAPHYLOCOCCUS AUREUS  Final   Organism ID, Bacteria PSEUDOMONAS AERUGINOSA  Final      Susceptibility   Methicillin resistant staphylococcus aureus - MIC*    CIPROFLOXACIN >=8 RESISTANT Resistant     ERYTHROMYCIN >=8 RESISTANT Resistant     GENTAMICIN <=0.5 SENSITIVE Sensitive     OXACILLIN >=4 RESISTANT Resistant     TETRACYCLINE <=1 SENSITIVE Sensitive     VANCOMYCIN 1 SENSITIVE Sensitive     TRIMETH/SULFA <=10 SENSITIVE Sensitive     CLINDAMYCIN <=0.25 SENSITIVE Sensitive     RIFAMPIN <=0.5 SENSITIVE Sensitive  Inducible Clindamycin NEGATIVE Sensitive     LINEZOLID 2 SENSITIVE Sensitive     * MODERATE METHICILLIN RESISTANT STAPHYLOCOCCUS AUREUS   Pseudomonas aeruginosa - MIC*    CEFTAZIDIME >=64 RESISTANT Resistant     CIPROFLOXACIN >=4 RESISTANT Resistant     GENTAMICIN <=1 SENSITIVE Sensitive     * FEW PSEUDOMONAS AERUGINOSA    IMAGING RESULTS: CT chest Rt middle lobe mucus plugs and b/l lower lobes and surrounding infiltrates  I have personally reviewed the films ? Impression/Recommendation ? GLENDON FISER is a 63 y.o. 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 ?b/l pulmonary infiltrate due to mucus plugs Colonized with pseudomonas in tracheal aspirate MRSA is new She has been treated with 6 days of Antipseudomonal antibiotics cefepime- consider Dcing it No MRSA treatment New leucocytosis- Michelle [name] has improved from  resp status- so not sure if Anti MRSA treatment is needed now --she is on vanco- linezolid may be better-if leucocytosis persist may repeat CT chest Watch closely for any diarrhea. If present , along with abdominal pain may need cdiff test  COPD- chronic resp failure- trach dependent  HTN  DM  Discussed the management with patient and Dr.Dgayli. HE would like to treat the MRSA

## 2023-09-04 ENCOUNTER — Inpatient Hospital Stay

## 2023-09-04 DIAGNOSIS — J9622 Acute and chronic respiratory failure with hypercapnia: Secondary | ICD-10-CM | POA: Diagnosis not present

## 2023-09-04 DIAGNOSIS — J15211 Pneumonia due to Methicillin susceptible Staphylococcus aureus: Secondary | ICD-10-CM | POA: Diagnosis not present

## 2023-09-04 DIAGNOSIS — R1084 Generalized abdominal pain: Secondary | ICD-10-CM

## 2023-09-04 DIAGNOSIS — I10 Essential (primary) hypertension: Secondary | ICD-10-CM | POA: Diagnosis not present

## 2023-09-04 DIAGNOSIS — J151 Pneumonia due to Pseudomonas: Secondary | ICD-10-CM

## 2023-09-04 DIAGNOSIS — J9621 Acute and chronic respiratory failure with hypoxia: Secondary | ICD-10-CM | POA: Diagnosis not present

## 2023-09-04 LAB — GLUCOSE, CAPILLARY
Glucose-Capillary: 176 mg/dL — ABNORMAL HIGH (ref 70–99)
Glucose-Capillary: 166 mg/dL — ABNORMAL HIGH (ref 70–99)
Glucose-Capillary: 107 mg/dL — ABNORMAL HIGH (ref 70–99)
Glucose-Capillary: 122 mg/dL — ABNORMAL HIGH (ref 70–99)
Glucose-Capillary: 141 mg/dL — ABNORMAL HIGH (ref 70–99)
Glucose-Capillary: 152 mg/dL — ABNORMAL HIGH (ref 70–99)

## 2023-09-04 LAB — RENAL FUNCTION PANEL
Albumin: 3.2 g/dL — ABNORMAL LOW (ref 3.5–5.0)
Anion gap: 10 (ref 5–15)
BUN: 25 mg/dL — ABNORMAL HIGH (ref 8–23)
CO2: 25 mmol/L (ref 22–32)
Calcium: 8.7 mg/dL — ABNORMAL LOW (ref 8.9–10.3)
Chloride: 101 mmol/L (ref 98–111)
Creatinine, Ser: 0.7 mg/dL (ref 0.44–1.00)
GFR, Estimated: >60 mL/min (ref >60)
Glucose, Bld: 165 mg/dL — ABNORMAL HIGH (ref 70–99)
Phosphorus: 2.6 mg/dL (ref 2.5–4.6)
Potassium: 3.2 mmol/L — ABNORMAL LOW (ref 3.5–5.1)
Sodium: 136 mmol/L (ref 135–145)

## 2023-09-04 LAB — CBC
HCT: 39.4 % (ref 36.0–46.0)
Hemoglobin: 12.5 g/dL (ref 12.0–15.0)
MCH: 26.1 pg (ref 26.0–34.0)
MCHC: 31.7 g/dL (ref 30.0–36.0)
MCV: 82.3 fL (ref 80.0–100.0)
Platelets: 311 10*3/uL (ref 150–400)
RBC: 4.79 MIL/uL (ref 3.87–5.11)
RDW: 14.7 % (ref 11.5–15.5)
WBC: 12.2 10*3/uL — ABNORMAL HIGH (ref 4.0–10.5)
nRBC: 0 % (ref 0.0–0.2)

## 2023-09-04 LAB — CREATININE, SERUM
Creatinine, Ser: 0.74 mg/dL (ref 0.44–1.00)
GFR, Estimated: >60 mL/min (ref >60)

## 2023-09-04 LAB — MAGNESIUM: Magnesium: 2.3 mg/dL (ref 1.7–2.4)

## 2023-09-04 MED ORDER — POTASSIUM CHLORIDE CRYS ER 20 MEQ PO TBCR
40.0000 meq | EXTENDED_RELEASE_TABLET | ORAL | Status: AC
  Administered 2023-09-04 (×2): 40 meq via ORAL
  Filled 2023-09-04 (×2): qty 2

## 2023-09-04 MED ORDER — LINEZOLID 600 MG PO TABS
600.0000 mg | ORAL_TABLET | Freq: Two times a day (BID) | ORAL | Status: DC
  Administered 2023-09-04 – 2023-09-05 (×3): 600 mg via ORAL
  Filled 2023-09-04 (×3): qty 1

## 2023-09-04 NOTE — Progress Notes (Addendum)
     [facility] REFERRAL        Occupational Therapy * Physical Therapy * Speech Therapy                           DATE: 09/04/2023  PATIENT NAME: Michelle [name] PATIENT MRN: [id number]        DIAGNOSIS/DIAGNOSIS CODE: Q25.95  DATE OF DISCHARGE: 09/05/2023       PRIMARY CARE PHYSICIAN:  Beverely Low   PCP PHONE/FAX : [phone]     Dear Provider (Name: [facility]  X  Fax: [phone]   I certify that I have examined this patient and that occupational/physical/speech therapy is necessary on an outpatient basis.    The patient has expressed interest in completing their recommended course of therapy at your  location.  Once a formal order from the patient's primary care physician has been obtained, please  contact him/her to schedule an appointment for evaluation at your earliest convenience.   Arly.[name] ]  Physical Therapy Evaluate and Treat  [  ]  Occupational Therapy Evaluate and Treat  [  ]  Speech Therapy Evaluate and Treat    Patient has chronic trach and on home ventilator.  Daughter is able to transport with portable Ventilator.       The patient's primary care physician (listed above) must furnish and be responsible for a formal order such that the recommended services may be furnished while under the primary physician's care, and that the plan of care will be established and reviewed every 30 days (or more often if condition necessitates).

## 2023-09-04 NOTE — TOC Transition Note (Signed)
 Transition of Care [facility]) - Discharge Note   Patient Details  Name: Michelle [name] MRN: [id number] Date of Birth: 10-04-1960  Transition of Care [facility]) CM/SW Contact:  Garret Reddish, RN Phone Number: 09/04/2023, 3:09 PM   Clinical Narrative:     Chart reviewed.  Noted that patient will be a tentative for tomorrow.    Noted that PT has recommended Home Health PT.  I have reached out to [place], [place], [place], [place], [place], [place], and [facility] and all agencies are not able to accept for home health due to patient's payor.  I have informed patient's daughter [place] of the above information.  I have asked [place] about Outpatient PT and she informed me that she would like for her mother to received outpatient PT and she would be able to transport Mrs. Cayson to outpatient PT.    I have informed Haleigh with [facility] that patient will be a tentative discharge for tomorrow.  I have also informed Monique with [facility] that patient would be a tentative discharge for tomorrow.    I have arranged ambulance transport with [facility] for 2 pm on tomorrow.  Raoul Pitch will be able to ride with Mrs. Lister on EMS transport to manage the home vent.  Raoul Pitch was agreeable to 2 pm ambulance transport.    TOC will continue to follow for discharge planning.    Final next level of care: OP Rehab (Home with support of daughter and private duty nursing and Outpatient PT.) Barriers to Discharge: No Barriers Identified   Patient Goals and CMS Choice   CMS Medicare.gov Compare Post Acute Care list provided to:: Patient Choice offered to / list presented to : Patient      Discharge Placement                  Name of family member notified: Thedore Mins Patient and family notified of of transfer: 09/04/23  Discharge Plan and Services Additional resources added to the After Visit Summary for     Discharge Planning Services: CM Consult Post Acute Care Choice:  (Patient has  Private duty nursing)            DME Agency:  (Patient has home vent and trach supplies via [employer])                  Social Drivers of Health (SDOH) Interventions SDOH Screenings   Food Insecurity: Patient Unable To Answer (08/30/2023)  Housing: Patient Unable To Answer (08/30/2023)  Transportation Needs: Patient Unable To Answer (08/30/2023)  Utilities: Patient Unable To Answer (08/30/2023)  Financial Resource Strain: Medium Risk (06/29/2017)  Physical Activity: Inactive (06/29/2017)  Social Connections: Unknown (06/29/2017)  Stress: Stress Concern Present (06/29/2017)  Tobacco Use: Medium Risk (08/28/2023)     Readmission Risk Interventions    04/19/2023    2:10 PM 02/22/2023    1:47 PM 10/03/2021    3:58 PM  Readmission Risk Prevention Plan  Transportation Screening Complete Complete Complete  PCP or Specialist Appt within 3-5 Days Complete Complete Complete  HRI or Home Care Consult Complete Complete   Social Work Consult for Recovery Care Planning/Counseling Complete Complete Complete  Palliative Care Screening Complete Complete Not Applicable  Medication Review Oceanographer) Complete Complete Complete

## 2023-09-04 NOTE — Progress Notes (Signed)
 NAME:  Michelle [name], MRN:  [id number], DOB:  [birth year], LOS: 7 ADMISSION DATE:  08/28/2023, CONSULTATION DATE:  08/28/23 REFERRING MD:  Dr. Scotty Court, CHIEF COMPLAINT:  Respiratory Failure, AECOPD   Brief Pt Description / Synopsis:  63 y.o. 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 To review, patient has had 4-5 days of SOB, wheezing, and productive green sputum, low grade fevers.    Upon arrival to the ER patient was on 6 liters [place], Temp 97.9, HR 152, RR 24, BP 181/158. [name] was exchanged and patient placed on the ventilator. Cultures sent, ceftriaxone, azithromycin, solumedrol, and duonebs given. Labs revealed WBC 15.6, Lactic acid 1.6, Procalcitonin 0.16, K 3.4, glucose 277, Viral panel negative.  CTA thorax negative for PE, mucus plugging of right middle lobe and bilateral lower lobe mucous plugging with consolidations, and emphysema. CT abd/pelvis severe atherosclerotic plaque of the aorta and its branches, no acute pathology noted.   PCCM asked to admit for further workup and treatment.  Please see "Significant Hospital Events" section below for full detailed hospital course.   Pertinent  Medical History  COPD/emphysema/asthma Chronic respiratory failure, trach placed 2019 HTN/HLD/CHF/CAD DM type 2  insulin dependent  Hep C Prior Tobacco and cocaine abuse Anxiety/Depression  Micro Data:  3/26: COVID/FLU/RSV PCR>> negative 3/26: Blood culture x2>> no growth 3/27: Urine>> Staphylococcus Aureus (resistant to Ciprofloxacin and Oxacillin) & Pseudomonas Aeruginosa (resistant to Ciprofloxacin and Gentamicin) 3/27: MRSA PCR>> negative 3/27: RVP>>negative 3/27: Strep pneumo urinary antigen>> negative 3/27: Legionella urinary antigen>> negative 3/29: Tracheal aspirate>>MRSA & Pseudomonas Aeruginosa  Antimicrobials:  Cefepime and azithromycin Anti-infectives (From admission, onward)    Start     Dose/Rate Route Frequency Ordered Stop   09/03/23 0000  vancomycin (VANCOREADY) IVPB 1250 mg/250 mL        1,250 mg 166.7 mL/hr over 90 Minutes Intravenous Every 24 hours 09/02/23 1215 09/07/23 2359   09/02/23 1300  ceFEPIme (MAXIPIME) 2 g in sodium chloride 0.9 % 100 mL IVPB        2 g 200 mL/hr over 30 Minutes Intravenous Every 8 hours 09/02/23 1217 09/05/23 0459   09/02/23 1000  vancomycin (VANCOREADY) IVPB 1500 mg/300 mL        1,500 mg 150 mL/hr over 120 Minutes Intravenous  Once 09/02/23 0829 09/02/23 1900   09/01/23 2200  azithromycin (ZITHROMAX) tablet 500 mg  Status:  Discontinued        500 mg Oral  Once 09/01/23 0807 09/01/23 0808   09/01/23 2200  azithromycin (ZITHROMAX) 500 mg in sodium chloride 0.9 % 250 mL IVPB        500 mg 250 mL/hr over 60 Minutes Intravenous Every 24 hours 09/01/23 0808 09/02/23 0005   08/29/23 2000  azithromycin (ZITHROMAX) 500 mg in sodium chloride 0.9 % 250 mL IVPB  Status:  Discontinued        500 mg 250  mL/hr over 60 Minutes Intravenous Every 24 hours 08/28/23 2223 09/01/23 0807   08/29/23 2000  cefTRIAXone (ROCEPHIN) 2 g in sodium chloride 0.9 % 100 mL IVPB  Status:  Discontinued        2 g 200 mL/hr over 30 Minutes Intravenous Every 24 hours 08/28/23 2223 08/29/23 0224   08/29/23 0315  ceFEPIme (MAXIPIME) 2 g in sodium chloride 0.9 % 100 mL IVPB   Status:  Discontinued        2 g 200 mL/hr over 30 Minutes Intravenous Every 8 hours 08/29/23 0224 09/02/23 1217   08/28/23 1900  cefTRIAXone (ROCEPHIN) 2 g in sodium chloride 0.9 % 100 mL IVPB        2 g 200 mL/hr over 30 Minutes Intravenous Once 08/28/23 1845 08/28/23 2000   08/28/23 1900  azithromycin (ZITHROMAX) 500 mg in sodium chloride 0.9 % 250 mL IVPB        500 mg 250 mL/hr over 60 Minutes Intravenous  Once 08/28/23 1845 08/28/23 2107       Significant Hospital Events: Including procedures, antibiotic start and stop dates in addition to other pertinent events   3/26: Admit to ICU on home ventilator with acute on chronic respiratory failure. Cultures pending, broad spectrum antibiotics.  3/27: On minimal vent settings, with intermittent anxiety requesting to resume her home Klonopin.  Discussed with Dr. Belia Heman, will allow her to start diet.  Cultures still pending, continue broad spectrum antibiotics. 3/28: No acute events overnight, on minimal vent support.  Tracheal aspirate preliminary results with Gram + cocci 08/31/2023: No acute issues overnight.  Remains on full vent support.  FiO2 down to 35%.  Remains afebrile. 3/30: No acute issues overnight.  P.o. meds restarted 3/31: No acute issues overnight.  Remains on minimal home vent settings.  Tracheal aspirate and Urine cultures resulted with Staph Aureus and Pseudomonas, broaden ABX to include Vancomycin on top of Cefepime.  D/c steroids. Obtain Echocardiogram. 4/1: No acute events noted overnight, remains on home vent settings. Afebrile, hemodynamically stable, no vasopressors.   No complaints overnight, reports appetite improved.  Pseudomonas found to be multi-drug resistant, consult ID. 4/2: No acute events overnight.  Pt tolerating home vent settings.  Pt c/o abdominal pain CT Abd Pelvis pending. Pt c/o worsening anxiety   Interim History / Subjective:  As outlined above under "Significant Hospital Events" section   Objective    Blood pressure 129/76, pulse 79, temperature 98.2 F (36.8 C), temperature source Oral, resp. rate 16, height 5' (1.524 m), weight 74.3 kg, last menstrual period 03/06/2005, SpO2 99%.    Vent Mode: PRVC FiO2 (%):  [35 %] 35 % Set Rate:  [16 bmp] 16 bmp Vt Set:  [400 mL] 400 mL PEEP:  [5 cmH20] 5 cmH20   Intake/Output Summary (Last 24 hours) at 09/04/2023 0818 Last data filed at 09/04/2023 0250 Gross per 24 hour  Intake 1188.36 ml  Output 800 ml  Net 388.36 ml   Filed Weights   09/01/23 0423 09/02/23 0500 09/04/23 0500  Weight: 73.2 kg 74.1 kg 74.3 kg    Examination: General: Acute on chronically ill appearing obese female, lying in bed, on mechanical ventilation via chronic trach, no acute distress.  Communicating by mouthing words and nodding HENT: Atraumatic, normocephalic, neck supple, difficult to assess JVD due to body habitus Lungs: Rhonchi throughout, even, non labored  Cardiovascular: Regular rate and rhythm, S1-S2, no murmurs, rubs, gallops. Abdomen: +BS x4, obese, soft, tender, non distended Extremities: Normal bulk and tone,  no deformities, no edema, no cyanosis.  Contractures at the level of the ankle left greater than right. Neuro: Awake and alert, oriented x 4, moves all extremities to commands, no focal deficits GU: External female catheter in place, draining normally  Resolved Hospital Problem list   None  Assessment & Plan:   #Acute on chronic hypoxic & hypercapnic respiratory failure  #AECOPD  #Staph aureus & pseudomonas pneumonia #Mucus plugging-improved PMHx: COPD/Emphysema/Asthma with chronic tracheostomy and home vent. Pseudomonas and Acinetobacter PNA  - Pt tolerating home ventilator  - Implement lung protective strategies - Plateau pressures less than 30 cm H20 - Wean FiO2 & PEEP as tolerated to maintain O2 sats 88 to 92% - Follow intermittent Chest X-ray & ABG as needed - Spontaneous Breathing Trials when respiratory parameters met and mental  status permits - VAP Bundle - Scheduled and prn bronchodilator therapy  - Nebulized steroids   #HTN  Hx: Hyperlipidemia, CAD, and CHF Echo 09/03/23: EF >55%; grade I diastolic dysfunction  - Continuous telemetry monitoring  - Continue scheduled amlodipine and prn labetalol for bp control   #MRSA & colonized pseudomonas in tracheal aspirate #MRSA & pseudomonas UTI  - Trend WBC and monitor fever curve  - Follow cultures  - ID consulted appreciate input - PO linezolid started 04/2; continue cefepime for now   #Type II diabetes mellitus  - CBG's q4hrs - SSI, scheduled novolog, and scheduled semglee  - Target CBG range 140 to 180   #Chronic pain syndrome #Acute abdominal pain  - Prn percocet and morphine for pain management  - CT Abd Pelvis pending   #Anxiety/Depression #Insomnia  - Continue scheduled klonopin; prn melatonin and valium  Best Practice (right click and "Reselect all SmartList Selections" daily)   Diet/type: Regular consistency (see orders) DVT prophylaxis: LMWH GI prophylaxis: H2B Lines: N/A Foley:  N/A Code Status:  full code Last date of multidisciplinary goals of care discussion: 09/04/2023  04/02: Pt updated at bedside on plan of care.  Able to communicate effectively by mouthing words.  Attempted to contact pts sister Capucine Tryon via telephone to provide an update, however she did not answer the telephone.  Will attempt to contact hr again later today  Labs   CBC: Recent Labs  Lab 08/28/23 1846 08/29/23 0101 08/31/23 0529 09/01/23 0326 09/02/23 1159 09/03/23 0413 09/04/23 0425  WBC 15.6*   < > 10.6* 11.6* 13.0* 16.1* 12.2*  NEUTROABS 10.4*  --   --   --   --   --   --   HGB 13.0   < > 11.3* 12.0 12.8 12.7 12.5  HCT 41.7   < > 35.7* 38.0 40.6 40.1 39.4  MCV 83.9   < > 83.2 83.3 82.2 83.5 82.3  PLT 332   < > 332 337 349 341 311   < > = values in this interval not displayed.    Basic Metabolic Panel: Recent Labs  Lab 08/31/23 0529  09/01/23 0326 09/02/23 0434 09/03/23 0413 09/04/23 0425  NA 138 140 141 141 136  K 4.2 3.1* 4.0 4.1 3.2*  CL 101 99 104 106 101  CO2 26 25 26 27 25   GLUCOSE 218* 151* 106* 81 165*  BUN 22 23 25* 27* 25*  CREATININE 0.65 0.65 0.65 0.64 0.74  0.70  CALCIUM 9.5 9.0 9.3 9.2 8.7*  MG 2.2 2.0 2.2 2.0 2.3  PHOS 2.1* 3.4 2.4* 3.4 2.6   GFR: Estimated Creatinine Clearance: 65.6 mL/min (by C-G formula based on  SCr of 0.74 mg/dL). Recent Labs  Lab 08/28/23 1846 08/29/23 0101 08/29/23 0307 09/01/23 0326 09/02/23 1159 09/03/23 0413 09/04/23 0425  PROCALCITON 0.16  --   --   --   --   --   --   WBC 15.6* 14.6*   < > 11.6* 13.0* 16.1* 12.2*  LATICACIDVEN 1.6 1.7  --   --   --   --   --    < > = values in this interval not displayed.    Liver Function Tests: Recent Labs  Lab 08/28/23 1846 09/02/23 0434 09/03/23 0413 09/04/23 0425  AST 23  --   --   --   ALT 19  --   --   --   ALKPHOS 94  --   --   --   BILITOT 0.6  --   --   --   PROT 8.4*  --   --   --   ALBUMIN 4.2 3.2* 3.4* 3.2*   No results for input(s): "LIPASE", "AMYLASE" in the last 168 hours. No results for input(s): "AMMONIA" in the last 168 hours.  ABG    Component Value Date/Time   PHART 7.31 (L) 09/30/2021 0500   PCO2ART 46 09/30/2021 0500   PO2ART 155 (H) 09/30/2021 0500   HCO3 24.8 08/29/2023 0307   ACIDBASEDEF 1.5 08/29/2023 0307   O2SAT 97.5 08/29/2023 0307     Coagulation Profile: Recent Labs  Lab 08/28/23 1846  INR 1.0    Cardiac Enzymes: No results for input(s): "CKTOTAL", "CKMB", "CKMBINDEX", "TROPONINI" in the last 168 hours.  HbA1C: Hemoglobin A1C  Date/Time Value Ref Range Status  06/29/2013 04:02 AM 6.0 4.2 - 6.3 % Final    Comment:    The American Diabetes Association recommends that a primary goal of therapy should be <7% and that physicians should reevaluate the treatment regimen in patients with HbA1c values consistently >8%.    Hgb A1c MFr Bld  Date/Time Value Ref Range  Status  08/29/2023 01:01 AM 9.4 (H) 4.8 - 5.6 % Final    Comment:    (NOTE) Pre diabetes:          5.7%-6.4%  Diabetes:              >6.4%  Glycemic control for   <7.0% adults with diabetes   02/19/2023 04:33 AM 10.3 (H) 4.8 - 5.6 % Final    Comment:    (NOTE)         Prediabetes: 5.7 - 6.4         Diabetes: >6.4         Glycemic control for adults with diabetes: <7.0     CBG: Recent Labs  Lab 09/03/23 1610 09/03/23 1939 09/03/23 2332 09/04/23 0356 09/04/23 0727  GLUCAP 208* 112* 113* 176* 166*    Review of Systems:   Positives in BOLD: Gen: anxiety, fever, chills, weight change, fatigue, night sweats HEENT: Denies blurred vision, double vision, hearing loss, tinnitus, sinus congestion, rhinorrhea, sore throat, neck stiffness, dysphagia PULM: Denies shortness of breath, cough, sputum production, hemoptysis, wheezing  CV: Denies chest pain, edema, orthopnea, paroxysmal nocturnal dyspnea, palpitations GI: Denies abdominal pain, nausea, vomiting, diarrhea, hematochezia, melena, constipation, change in bowel habits GU: Denies dysuria, hematuria, polyuria, oliguria, urethral discharge Endocrine: Denies hot or cold intolerance, polyuria, polyphagia or appetite change Derm: Denies rash, dry skin, scaling or peeling skin change Heme: Denies easy bruising, bleeding, bleeding gums Neuro: Denies headache, numbness, weakness, slurred speech, loss of memory or  consciousness Extremities: Reports bilateral lower extremity pain  Past Medical History:  She,  has a past medical history of Allergy, Anxiety, Asthma, CHF (congestive heart failure) (HCC), Cocaine abuse (HCC), COPD (chronic obstructive pulmonary disease) (HCC), Coronary artery disease, Emphysema/COPD (HCC), Hepatitis C, High cholesterol, Hypertension, Pneumothorax, and Tobacco abuse.   Surgical History:   Past Surgical History:  Procedure Laterality Date   CARDIAC CATHETERIZATION     CHEST TUBE INSERTION     IR  GASTROSTOMY TUBE MOD SED  10/31/2017   TRACHEOSTOMY TUBE PLACEMENT N/A 10/17/2017   Procedure: TRACHEOSTOMY;  Surgeon: Bud Face, MD;  Location: [facility] ORS;  Service: ENT;  Laterality: N/A;     Social History:   reports that she quit smoking about 6 years ago. Her smoking use included cigarettes. She started smoking about 47 years ago. She has a 4.1 pack-year smoking history. She has never used smokeless tobacco. She reports that she does not currently use drugs after having used the following drugs: "Crack" cocaine. She reports that she does not drink alcohol.   Family History:  Her family history includes Asthma in her mother; Breast cancer (age of onset: 4) in her maternal aunt; CAD in her father; COPD in her sister; CVA in her father; Lung cancer in her mother; Stroke in her father.   Allergies Allergies  Allergen Reactions   Lorazepam     Makes anxiety worse  Other Reaction(s): Makes anxiety worse   Lisinopril Cough   Amoxicillin     Patient has tolerated cephalosporins in the past     Home Medications  Prior to Admission medications   Medication Sig Start Date End Date Taking? Authorizing Provider  acetaminophen (TYLENOL) 325 MG tablet Take 2 tablets (650 mg total) by mouth every 6 (six) hours as needed for mild pain (pain score 1-3) (or Fever >/= 101). 04/24/23   Loyce Dys, MD  albuterol (PROVENTIL) (2.5 MG/3ML) 0.083% nebulizer solution Take 3 mLs (2.5 mg total) by nebulization every 6 (six) hours as needed for wheezing or shortness of breath. 03/27/23   Salena Saner, MD  albuterol (VENTOLIN HFA) 108 (90 Base) MCG/ACT inhaler Inhale 2 puffs into the lungs every 6 (six) hours as needed for wheezing or shortness of breath. 04/02/22   Parrett, Virgel Bouquet, NP  amLODipine (NORVASC) 5 MG tablet Take 5 mg by mouth daily. 08/27/21   [provider]  aspirin 81 MG chewable tablet Chew 81 mg by mouth daily.     [provider]  azelastine (ASTELIN) 0.1 %  nasal spray SMARTSIG:1-2 Spray(s) Both Nares Every 12 Hours PRN 09/22/20   [provider]  azithromycin (ZITHROMAX Z-PAK) 250 MG tablet Take 2 tablets on Day 1 and then 1 tablet daily till gone. 06/17/23   Erin Fulling, MD  benzonatate (TESSALON PERLES) 100 MG capsule Take 1-2 capsules (100-200 mg total) by mouth 3 (three) times daily as needed for cough. 02/22/23   Sunnie Nielsen, DO  Blood Glucose Monitoring Suppl ([employer] BLOOD GLUCOSE METER) w/Device KIT Use as directed to monitor blood glucose up to qid 02/22/23   Sunnie Nielsen, DO  cetirizine (ZYRTEC) 10 MG tablet Take 10 mg by mouth daily. 03/21/22   [provider]  clonazePAM (KLONOPIN) 0.5 MG tablet Take 0.5 mg by mouth in the morning, at noon, and at bedtime.    [provider]  cloNIDine (CATAPRES - DOSED IN MG/24 HR) 0.1 mg/24hr patch Place 0.1 mg onto the skin once a week.  [provider]  D3-50 1.25 MG (50000 UT) capsule Take 50,000 Units by mouth once a week. 12/28/22   [provider]  docusate sodium (COLACE) 100 MG capsule Take 2 capsules (200 mg total) by mouth daily as needed for mild constipation. 04/24/23   Loyce Dys, MD  fluticasone (FLONASE) 50 MCG/ACT nasal spray Place 1 spray into both nostrils daily.    [provider]  guaiFENesin (MUCINEX) 600 MG 12 hr tablet Take 600 mg by mouth 2 (two) times daily.    [provider]  hydrOXYzine (VISTARIL) 25 MG capsule Take 25 mg by mouth every 6 (six) hours as needed. 10/01/22   [provider]  ipratropium-albuterol (DUONEB) 0.5-2.5 (3) MG/3ML SOLN Take 3 mLs by nebulization every 6 (six) hours as needed (for shortness of breath). 06/17/23   Erin Fulling, MD  LANTUS SOLOSTAR 100 UNIT/ML Solostar Pen Inject 10 Units into the skin at bedtime. 01/03/23   [provider]  levofloxacin (LEVAQUIN) 750 MG tablet Take 1 tablet (750 mg total) by mouth daily. 08/28/23   Erin Fulling, MD  mirtazapine (REMERON)  30 MG tablet Take 30 mg by mouth at bedtime. 01/19/21   [provider]  mupirocin ointment (BACTROBAN) 2 % Apply 1 Application topically daily. To inside nose and to affected area of axilla 05/21/22   Moye, [place], MD  naloxone [facility]) nasal spray 4 mg/0.1 mL Place 1 spray into the nose once. 10/22/22   [provider]  NOVOLOG FLEXPEN 100 UNIT/ML FlexPen Inject into the skin 3 (three) times daily with meals. Sliding scale mealtime insulin. 03/01/21   [provider]  nystatin ointment (MYCOSTATIN) Apply 1 Application topically 3 (three) times daily. 01/04/23   [provider]  oxyCODONE-acetaminophen (PERCOCET/ROXICET) 5-325 MG tablet Take 1 tablet by mouth 3 (three) times daily as needed for pain. 09/07/21   [provider]  PARoxetine (PAXIL) 40 MG tablet Take 40 mg by mouth every morning. Give 30 mg x 1 week (1.5 tab of 20 mg) then start 40 mg daily. D/c 20 mg pills at that time.    [provider]  polyethylene glycol powder (GLYCOLAX/MIRALAX) 17 GM/SCOOP powder Take by mouth.    [provider]  predniSONE (DELTASONE) 20 MG tablet Take 1 tablet (20 mg total) by mouth daily with breakfast. 7 days 06/17/23   Erin Fulling, MD  predniSONE (DELTASONE) 20 MG tablet Take 1 tablet (20 mg total) by mouth daily with breakfast for 10 days. 10 days 08/28/23 09/07/23  Erin Fulling, MD  PULMICORT 0.5 MG/2ML nebulizer solution  02/13/21   [provider]  terbinafine (LAMISIL) 1 % cream Apply 1 application  topically 2 (two) times daily. To affected skin, for 1 week after resolution of rash. # 30 gm, 1 refill.    [provider]  TRULICITY 4.5 MG/0.5ML SOPN Inject 4.5 mg into the skin once a week. 09/28/21   [provider]  Vitamin D, Ergocalciferol, (DRISDOL) 1.25 MG (50000 UNIT) CAPS capsule Take 50,000 Units by mouth 2 (two) times a week.    [provider]  fluconazole (DIFLUCAN) 150 MG tablet Take 1 tablet day 1  repeat in 1 week 11/15/20   Sandi Mealy, MD     Critical care time: 40 minutes    Zada Girt, [facility]  Pulmonary/Critical Care Pager [phone] (please enter 7 digits) PCCM Consult Pager [phone] (please enter 7 digits)

## 2023-09-04 NOTE — Progress Notes (Signed)
 Date of Admission:  08/28/2023      ID: Michelle [name] is a 63 y.o. female Principal Problem:   Acute on chronic respiratory failure with hypoxia (HCC) Active Problems:   COPD (chronic obstructive pulmonary disease) (HCC)   Sepsis (HCC)   CAD (coronary artery disease)   HTN (hypertension)   Chronic diastolic CHF (congestive heart failure) (HCC)   DM (diabetes mellitus) (HCC)   Depression with anxiety   Hyperlipidemia associated with type 2 diabetes mellitus (HCC)   PEG (percutaneous endoscopic gastrostomy) status (HCC)   Tracheostomy in place (HCC)   Obesity (BMI 30-39.9)   Essential hypertension   COPD exacerbation (HCC)   Anxiety and depression   Ventilator dependent (HCC)    Subjective: Pt feeling better Sitting in recliner Still has some abdominal   pain CT abdomen done today  Medications:   amLODipine  5 mg Oral Daily   budesonide (PULMICORT) nebulizer solution  0.25 mg Nebulization BID   Chlorhexidine Gluconate Cloth  6 each Topical Nightly   clonazePAM  0.5 mg Oral TID   enoxaparin (LOVENOX) injection  40 mg Subcutaneous Q24H   famotidine  20 mg Oral BID   feeding supplement  237 mL Oral TID BM   fluticasone  1 spray Each Nare Daily   insulin aspart  0-20 Units Subcutaneous Q4H   insulin aspart  4 Units Subcutaneous TID WC   insulin glargine-yfgn  12 Units Subcutaneous Daily   ipratropium-albuterol  3 mL Nebulization Q6H   linezolid  600 mg Oral Q12H   multivitamin with minerals  1 tablet Oral Daily   mouth rinse  15 mL Mouth Rinse 4 times per day   potassium chloride  40 mEq Oral Q4H    Objective: Vital signs in last 24 hours: Patient Vitals for the past 24 hrs:  BP Temp Temp src Pulse Resp SpO2 Weight  09/04/23 0900 123/81 -- -- 91 15 99 % --  09/04/23 0800 107/73 98 F (36.7 C) Oral 79 17 98 % --  09/04/23 0700 129/76 -- -- 79 16 99 % --  09/04/23 0600 112/67 -- -- 84 17 97 % --  09/04/23 0500 123/71 -- -- 86 15 98 % 74.3 kg  09/04/23 0400 98/63  98.2 F (36.8 C) Oral 81 15 99 % --  09/04/23 0340 -- -- -- -- -- 99 % --  09/04/23 0300 119/74 -- -- 78 16 99 % --  09/04/23 0200 130/73 -- -- 78 16 99 % --  09/04/23 0100 110/65 -- -- 80 15 99 % --  09/04/23 0000 110/70 97.8 F (36.6 C) Oral 86 15 99 % --  09/03/23 2358 -- -- -- -- -- 99 % --  09/03/23 2300 (!) 142/83 -- -- 99 18 99 % --  09/03/23 2200 139/80 -- -- 85 16 100 % --  09/03/23 2119 -- -- -- 91 19 100 % --  09/03/23 2000 131/87 98.1 F (36.7 C) Oral 86 17 100 % --  09/03/23 1800 126/75 -- -- 86 15 100 % --  09/03/23 1730 121/77 -- -- 86 19 100 % --  09/03/23 1615 -- -- -- -- -- 100 % --  09/03/23 1600 136/71 98.4 F (36.9 C) Oral 92 20 100 % --  09/03/23 1500 (!) 171/94 -- -- (!) 105 20 100 % --  09/03/23 1400 (!) 147/83 -- -- 86 17 100 % --  09/03/23 1305 (!) 162/92 -- -- 81 17 100 % --  09/03/23  1200 (!) 149/83 98.3 F (36.8 C) Oral 72 18 100 % --  09/03/23 1100 111/70 -- -- 70 13 99 % --      PHYSICAL EXAM:  General: Alert, cooperative, no distress, appears stated age.  tracheostomy. Lungs: b/l air entry. Heart: Regular rate and rhythm, no murmur, rub or gallop. Abdomen: Soft, non-tender,not distended. Bowel sounds normal. No masses Extremities: atraumatic, no cyanosis. No edema. No clubbing Skin: No rashes or lesions. Or bruising Lymph: Cervical, supraclavicular normal. Neurologic: Grossly non-focal  Lab Results    Latest Ref Rng & Units 09/04/2023    4:25 AM 09/03/2023    4:13 AM 09/02/2023   11:59 AM  CBC  WBC 4.0 - 10.5 K/uL 12.2  16.1  13.0   Hemoglobin 12.0 - 15.0 g/dL 13.0  86.5  78.4   Hematocrit 36.0 - 46.0 % 39.4  40.1  40.6   Platelets 150 - 400 K/uL 311  341  349        Latest Ref Rng & Units 09/04/2023    4:25 AM 09/03/2023    4:13 AM 09/02/2023    4:34 AM  CMP  Glucose 70 - 99 mg/dL 696  81  295   BUN 8 - 23 mg/dL 25  27  25    Creatinine 0.44 - 1.00 mg/dL 2.84 - 1.32 mg/dL 4.40    1.02  7.25  3.66   Sodium 135 - 145 mmol/L 136  141   141   Potassium 3.5 - 5.1 mmol/L 3.2  4.1  4.0   Chloride 98 - 111 mmol/L 101  106  104   CO2 22 - 32 mmol/L 25  27  26    Calcium 8.9 - 10.3 mg/dL 8.7  9.2  9.3       Microbiology: Tracheal aspirate culture - MRSA /pseudomonas Studies/Results: ECHOCARDIOGRAM COMPLETE Result Date: 09/03/2023    ECHOCARDIOGRAM REPORT   Patient Name:   Michelle [name] Date of Exam: 09/03/2023 Medical Rec #:  [id number]     Height:       60.0 in Accession #:    [id number]    Weight:       163.4 lb Date of Birth:  11/07/1960     BSA:          1.713 m Patient Age:    63 years      BP:           157/77 mmHg Patient Gender: F             HR:           72 bpm. Exam Location:  [facility] Procedure: 2D Echo, Cardiac Doppler and Color Doppler (Both Spectral and Color            Flow Doppler were utilized during procedure). Indications:     Acute respiratory distress  History:         Patient has prior history of Echocardiogram examinations, most                  recent 12/19/2017. CHF, CAD, COPD, Signs/Symptoms:Altered Mental                  Status; Risk Factors:Hypertension, Diabetes, Dyslipidemia and                  Current Smoker. Cocaine abuse.  Sonographer:     Mikki Harbor Referring Phys:  [id number] Judithe Modest Diagnosing Phys: Yvonne Kendall MD  Sonographer Comments: Technically difficult study  due to poor echo windows and suboptimal parasternal window. IMPRESSIONS  1. Left ventricular ejection fraction, by estimation, is >55%. The left ventricle has normal function. Left ventricular endocardial border not optimally defined to evaluate regional wall motion. There is moderate left ventricular hypertrophy. Left ventricular diastolic parameters are consistent with Grade I diastolic dysfunction (impaired relaxation).  2. Right ventricular systolic function is normal. The right ventricular size is mildly enlarged. There is normal pulmonary artery systolic pressure.  3. The mitral valve was not well visualized. No evidence of mitral  valve regurgitation.  4. The aortic valve was not well visualized. Aortic valve regurgitation is not visualized. No aortic stenosis is present.  5. Pulmonic valve regurgitation not well-assessed.  6. The inferior vena cava is normal in size with <50% respiratory variability, suggesting right atrial pressure of 8 mmHg. FINDINGS  Left Ventricle: Left ventricular ejection fraction, by estimation, is >55%. The left ventricle has normal function. Left ventricular endocardial border not optimally defined to evaluate regional wall motion. The left ventricular internal cavity size was  normal in size. There is moderate left ventricular hypertrophy. Left ventricular diastolic parameters are consistent with Grade I diastolic dysfunction (impaired relaxation). Right Ventricle: The right ventricular size is mildly enlarged. No increase in right ventricular wall thickness. Right ventricular systolic function is normal. There is normal pulmonary artery systolic pressure. The tricuspid regurgitant velocity is 2.57  m/s, and with an assumed right atrial pressure of 8 mmHg, the estimated right ventricular systolic pressure is 34.4 mmHg. Left Atrium: Left atrial size was normal in size. Right Atrium: Right atrial size was not well visualized. Pericardium: There is no evidence of pericardial effusion. Mitral Valve: The mitral valve was not well visualized. No evidence of mitral valve regurgitation. MV peak gradient, 3.7 mmHg. The mean mitral valve gradient is 1.0 mmHg. Tricuspid Valve: The tricuspid valve is normal in structure. Tricuspid valve regurgitation is trivial. Aortic Valve: The aortic valve was not well visualized. Aortic valve regurgitation is not visualized. No aortic stenosis is present. Aortic valve mean gradient measures 2.0 mmHg. Aortic valve peak gradient measures 3.5 mmHg. Aortic valve area, by VTI measures 2.53 cm. Pulmonic Valve: The pulmonic valve was not well visualized. Pulmonic valve regurgitation not  well-assessed. Aorta: The aortic root is normal in size and structure. Pulmonary Artery: The pulmonary artery is not well seen. Venous: The inferior vena cava is normal in size with less than 50% respiratory variability, suggesting right atrial pressure of 8 mmHg. IAS/Shunts: The interatrial septum was not well visualized.  LEFT VENTRICLE PLAX 2D LVIDd:         4.20 cm   Diastology LVIDs:         1.65 cm   LV e' medial:    5.52 cm/s LV PW:         1.30 cm   LV E/e' medial:  12.3 LV IVS:        1.30 cm   LV e' lateral:   5.17 cm/s LVOT diam:     1.90 cm   LV E/e' lateral: 13.2 LV SV:         47 LV SV Index:   27 LVOT Area:     2.84 cm  LEFT ATRIUM           Index LA diam:      2.40 cm 1.40 cm/m LA Vol (A4C): 18.3 ml 10.68 ml/m  AORTIC VALVE AV Area (Vmax):    2.63 cm AV Area (Vmean):   2.30  cm AV Area (VTI):     2.53 cm AV Vmax:           93.10 cm/s AV Vmean:          58.300 cm/s AV VTI:            0.185 m AV Peak Grad:      3.5 mmHg AV Mean Grad:      2.0 mmHg LVOT Vmax:         86.20 cm/s LVOT Vmean:        47.300 cm/s LVOT VTI:          0.165 m LVOT/AV VTI ratio: 0.89  AORTA Ao Root diam: 3.00 cm MITRAL VALVE               TRICUSPID VALVE MV Area (PHT): 3.24 cm    TR Peak grad:   26.4 mmHg MV Area VTI:   2.00 cm    TR Vmax:        257.00 cm/s MV Peak grad:  3.7 mmHg MV Mean grad:  1.0 mmHg    SHUNTS MV Vmax:       0.96 m/s    Systemic VTI:  0.16 m MV Vmean:      50.0 cm/s   Systemic Diam: 1.90 cm MV Decel Time: 234 msec MV E velocity: 68.10 cm/s MV A velocity: 86.50 cm/s MV E/A ratio:  0.79 Michelle Deer End MD Electronically signed by Yvonne Kendall MD Signature Date/Time: 09/03/2023/4:15:06 PM    Final    DG Chest Port 1 View Result Date: 09/02/2023 CLINICAL DATA:  Acute respiratory failure.  Hypoxia. EXAM: PORTABLE CHEST 1 VIEW COMPARISON:  Chest radiograph dated 04/20/2023. FINDINGS: Tracheostomy proximally 8 cm above the carina. There is blunting of the costophrenic angles, likely chronic  atelectasis/scarring. There is background of emphysema. No consolidative changes, pleural effusion or pneumothorax. The cardiac silhouette is within normal limits. No acute osseous pathology. IMPRESSION: 1. No active disease. 2. Emphysema. Electronically Signed   By: Elgie Collard M.D.   On: 09/02/2023 13:22     Assessment/Plan: ESBEYDI MANAGO is a 63 y.o. 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 Pneumonia?b/l pulmonary infiltrate due to mucus plugs Colonized with pseudomonas in tracheal aspirate MRSA is new She has been treated with 7 days of Antipseudomonal antibiotics cefepime- will Dc No MRSA treatment New leucocytosis- --she is on vanco as per Dr.Dgayli to treat MRSA ---leucocytosis improved- Resp status much better  Abd pain- CT abdomen no acute findings   COPD- chronic resp failure- trach dependent   HTN   DM   Discussed the management with patient and Dr.Dgayli.   ID will sign off. Call back if needed

## 2023-09-04 NOTE — Progress Notes (Addendum)
 Physical Therapy Treatment Patient Details Name: Michelle [name] MRN: [id number] DOB: 1961/03/10 Today's Date: 09/04/2023   History of Present Illness Pt is a 63 year old female with a PMH significant for MRSA and Pseudomonas pneumonia, Atypical pneumonia, COPD/Emphysema/Asthma, 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, HTN, HLD, CHF, CAD s/p STEMI s/ stent 2009, DM type 2 w/ insulin dependence, Hepatitis C, Former tobacco and cocaine use, Anxiety/Depression, and Obesity that presented to ED with COPD exacerbation.    PT Comments  Pt alert, reported abdominal pain, RN aware. Pt with improved tolerance to activity today, 2+ for lines/leads pt/therapist safety. She was able to perform a few supine LE exercises, and then transition to EOB with HOB elevated, bed rails, extra time and minA, fair sitting balance. With maximum encouragement, pt stood pivoted to recliner in room, minA. Able to reach for arm of recliner to assist with transfer. Pt with needs in reach. The patient would benefit from further skilled PT intervention to continue to progress towards goals.      If plan is discharge home, recommend the following: Two people to help with walking and/or transfers;A lot of help with bathing/dressing/bathroom;Assist for transportation;Help with stairs or ramp for entrance;Assistance with cooking/housework   Can travel by Doctor, hospital cushion (measurements PT);Hoyer lift    Recommendations for [employer]       Precautions / Restrictions Precautions Precautions: Fall Recall of Precautions/Restrictions: Intact Restrictions Weight Bearing Restrictions Per Provider Order: No     Mobility  Bed Mobility Overal bed mobility: Needs Assistance Bed Mobility: Supine to Sit     Supine to sit: Min assist, Used rails, HOB elevated          Transfers Overall transfer level: Needs assistance Equipment used: 1 person hand held assist Transfers:  Bed to chair/wheelchair/BSC   Stand pivot transfers: Min assist         General transfer comment: pt able to reach for chair arm, pivot with minA, 2nd assist for lines/leads/motivation for pt    Ambulation/Gait                   Stairs             Wheelchair Mobility     Tilt Bed    Modified Rankin (Stroke Patients Only)       Balance Overall balance assessment: Needs assistance Sitting-balance support: Feet supported Sitting balance-Leahy Scale: Fair     Standing balance support: Bilateral upper extremity supported Standing balance-Leahy Scale: Poor                              Communication Communication Communication: Impaired Factors Affecting Communication: Trach/intubated  Cognition Arousal: Alert Behavior During Therapy: Anxious   PT - Cognitive impairments: No apparent impairments                                Cueing    Exercises      General Comments        Pertinent Vitals/Pain Pain Assessment Pain Assessment: Faces Faces Pain Scale: Hurts little more Pain Location: abdominal pain Pain Descriptors / Indicators: Grimacing, Sore Pain Intervention(s): Monitored during session, Limited activity within patient's tolerance, Repositioned    Home Living  Prior Function            PT Goals (current goals can now be found in the care plan section) Progress towards PT goals: Progressing toward goals    Frequency    Min 1X/week      PT Plan      Co-evaluation PT/OT/SLP Co-Evaluation/Treatment: Yes Reason for Co-Treatment: To address functional/ADL transfers;Complexity of the patient's impairments (multi-system involvement) PT goals addressed during session: Mobility/safety with mobility OT goals addressed during session: ADL's and self-care      AM-PAC PT "6 Clicks" Mobility   Outcome Measure  Help needed turning from your back to your side while in a  flat bed without using bedrails?: A Lot Help needed moving from lying on your back to sitting on the side of a flat bed without using bedrails?: A Lot Help needed moving to and from a bed to a chair (including a wheelchair)?: A Lot Help needed standing up from a chair using your arms (e.g., wheelchair or bedside chair)?: A Lot Help needed to walk in hospital room?: Total Help needed climbing 3-5 steps with a railing? : Total 6 Click Score: 10    End of Session   Activity Tolerance: Patient tolerated treatment well Patient left: with call bell/phone within reach;in chair Nurse Communication: Mobility status PT Visit Diagnosis: Difficulty in walking, not elsewhere classified (R26.2)     Time: 1426-1440 PT Time Calculation (min) (ACUTE ONLY): 14 min  Charges:    $Therapeutic Activity: 8-22 mins PT General Charges $$ ACUTE PT VISIT: 1 Visit                     Olga Coaster PT, DPT 4:02 PM,09/04/23

## 2023-09-04 NOTE — Progress Notes (Signed)
 Occupational Therapy Treatment Patient Details Name: Michelle [name] MRN: [id number] DOB: 04-07-1961 Today's Date: 09/04/2023   History of present illness Pt is a 63 year old female with a PMH significant for MRSA and Pseudomonas pneumonia, Atypical pneumonia, COPD/Emphysema/Asthma, 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, HTN, HLD, CHF, CAD s/p STEMI s/ stent 2009, DM type 2 w/ insulin dependence, Hepatitis C, Former tobacco and cocaine use, Anxiety/Depression, and Obesity that presented to ED with COPD exacerbation.   OT comments  Michelle [name] was seen for OT treatment on this date. Upon arrival to room pt seated in chair, tolerated ~20 min sitting OOB and requesting to return. Pt requires MAX A don B socks bed level. MIN A + HHA chair>bed t/f, +2 for lines mgmt. Reports anxiousness, RN notified. Pt making good progress toward goals, will continue to follow POC. Discharge recommendation remains appropriate.        If plan is discharge home, recommend the following:  A lot of help with walking and/or transfers;A lot of help with bathing/dressing/bathroom   Equipment Recommendations  Wheelchair cushion (measurements OT);BSC/3in1;Hoyer lift;Hospital bed    Recommendations for Other Services      Precautions / Restrictions Precautions Precautions: Fall Recall of Precautions/Restrictions: Intact Restrictions Weight Bearing Restrictions Per Provider Order: No       Mobility Bed Mobility Overal bed mobility: Needs Assistance Bed Mobility: Sit to Supine     Supine to sit: Used rails, Contact guard          Transfers Overall transfer level: Needs assistance Equipment used: 1 person hand held assist Transfers: Bed to chair/wheelchair/BSC   Stand pivot transfers: Min assist, +2 safety/equipment               Balance Overall balance assessment: Needs assistance Sitting-balance support: Feet supported Sitting balance-Leahy Scale: Fair     Standing balance support: Bilateral  upper extremity supported Standing balance-Leahy Scale: Poor                             ADL either performed or assessed with clinical judgement   ADL Overall ADL's : Needs assistance/impaired                                       General ADL Comments: MAX A don B socks bed level. MIN A + HHA for simulated BSC t/f     Communication Communication Communication: Impaired Factors Affecting Communication: Trach/intubated   Cognition Arousal: Alert Behavior During Therapy: Anxious Cognition: No apparent impairments                               Following commands: Intact                      Pertinent Vitals/ Pain       Pain Assessment Pain Assessment: Faces Faces Pain Scale: Hurts little more Pain Location: abdominal pain Pain Descriptors / Indicators: Grimacing, Sore Pain Intervention(s): Repositioned   Frequency  Min 2X/week        Progress Toward Goals  OT Goals(current goals can now be found in the care plan section)  Progress towards OT goals: Progressing toward goals  Acute Rehab OT Goals OT Goal Formulation: With patient Time For Goal Achievement: 09/12/23 Potential to Achieve Goals: Good ADL Goals  Pt Will Perform Grooming: with set-up;with supervision;sitting Pt Will Transfer to Toilet: with min assist;stand pivot transfer;bedside commode  Plan      Co-evaluation      Reason for Co-Treatment: To address functional/ADL transfers;Complexity of the patient's impairments (multi-system involvement) PT goals addressed during session: Mobility/safety with mobility OT goals addressed during session: ADL's and self-care      AM-PAC OT "6 Clicks" Daily Activity     Outcome Measure   Help from another person eating meals?: None Help from another person taking care of personal grooming?: None Help from another person toileting, which includes using toliet, bedpan, or urinal?: A Lot Help from another person  bathing (including washing, rinsing, drying)?: A Lot Help from another person to put on and taking off regular upper body clothing?: A Little Help from another person to put on and taking off regular lower body clothing?: A Lot 6 Click Score: 17    End of Session    OT Visit Diagnosis: Other abnormalities of gait and mobility (R26.89);Muscle weakness (generalized) (M62.81)   Activity Tolerance Patient tolerated treatment well   Patient Left in bed;with call bell/phone within reach   Nurse Communication Patient requests pain meds        Time: 1610-9604 OT Time Calculation (min): 12 min  Charges: OT General Charges $OT Visit: 1 Visit OT Treatments $Self Care/Home Management : 8-22 mins  Michelle [name], M.S. OTR/L  09/04/23, 3:23 PM  ascom [phone]

## 2023-09-04 NOTE — Consult Note (Signed)
 PHARMACY CONSULT NOTE - ELECTROLYTES  Pharmacy Consult for Electrolyte Monitoring and Replacement   Recent Labs: Height: 5' (152.4 cm) Weight: 74.3 kg (163 lb 12.8 oz) IBW/kg (Calculated) : 45.5 Estimated Creatinine Clearance: 65.6 mL/min (by C-G formula based on SCr of 0.74 mg/dL). Potassium (mmol/L)  Date Value  09/04/2023 3.2 (L)  07/11/2014 4.6   Magnesium (mg/dL)  Date Value  60/45/4098 2.3  10/31/2013 2.6 (H)   Calcium (mg/dL)  Date Value  11/91/4782 8.7 (L)   Calcium, Total (mg/dL)  Date Value  95/62/1308 8.9   Albumin (g/dL)  Date Value  65/78/4696 3.2 (L)  07/09/2014 3.7   Phosphorus (mg/dL)  Date Value  29/52/8413 2.6  10/31/2013 4.6   Sodium (mmol/L)  Date Value  09/04/2023 136  07/11/2014 139    Assessment  Michelle [name] is a 63 y.o. female presenting with SOB, wheezing, and productive green sputum. Pharmacy has been consulted to monitor and replace electrolytes while under CCM care.  Goal of Therapy: Electrolytes WNL  Plan:  ---40 mEq po Kcl x 2 per NP ---F/u with AM labs.   Thank you for allowing pharmacy to be a part of this patient's care.  Lowella Bandy, PharmD Clinical Pharmacist 09/04/2023 7:01 AM

## 2023-09-04 NOTE — Plan of Care (Signed)
  Problem: Respiratory: Goal: Ability to maintain a clear airway and adequate ventilation will improve Outcome: Progressing   Problem: Role Relationship: Goal: Method of communication will improve Outcome: Progressing   Problem: Education: Goal: Knowledge of General Education information will improve Description: Including pain rating scale, medication(s)/side effects and non-pharmacologic comfort measures Outcome: Progressing   Problem: Health Behavior/Discharge Planning: Goal: Ability to manage health-related needs will improve Outcome: Progressing   Problem: Clinical Measurements: Goal: Ability to maintain clinical measurements within normal limits will improve Outcome: Progressing Goal: Diagnostic test results will improve Outcome: Progressing Goal: Respiratory complications will improve Outcome: Progressing Goal: Cardiovascular complication will be avoided Outcome: Progressing

## 2023-09-04 NOTE — Plan of Care (Signed)
  Problem: Respiratory: Goal: Ability to maintain a clear airway and adequate ventilation will improve Outcome: Progressing   Problem: Education: Goal: Knowledge of General Education information will improve Description: Including pain rating scale, medication(s)/side effects and non-pharmacologic comfort measures Outcome: Progressing   Problem: Clinical Measurements: Goal: Diagnostic test results will improve Outcome: Progressing Goal: Respiratory complications will improve Outcome: Progressing Goal: Cardiovascular complication will be avoided Outcome: Progressing   Problem: Nutrition: Goal: Adequate nutrition will be maintained Outcome: Progressing   Problem: Coping: Goal: Level of anxiety will decrease Outcome: Progressing   Problem: Elimination: Goal: Will not experience complications related to bowel motility Outcome: Progressing Goal: Will not experience complications related to urinary retention Outcome: Progressing

## 2023-09-05 DIAGNOSIS — J9622 Acute and chronic respiratory failure with hypercapnia: Secondary | ICD-10-CM | POA: Diagnosis not present

## 2023-09-05 DIAGNOSIS — J9621 Acute and chronic respiratory failure with hypoxia: Secondary | ICD-10-CM | POA: Diagnosis not present

## 2023-09-05 DIAGNOSIS — I10 Essential (primary) hypertension: Secondary | ICD-10-CM | POA: Diagnosis not present

## 2023-09-05 DIAGNOSIS — J15211 Pneumonia due to Methicillin susceptible Staphylococcus aureus: Secondary | ICD-10-CM | POA: Diagnosis not present

## 2023-09-05 LAB — CBC
HCT: 39.5 % (ref 36.0–46.0)
Hemoglobin: 12.2 g/dL (ref 12.0–15.0)
MCH: 25.6 pg — ABNORMAL LOW (ref 26.0–34.0)
MCHC: 30.9 g/dL (ref 30.0–36.0)
MCV: 83 fL (ref 80.0–100.0)
Platelets: 258 10*3/uL (ref 150–400)
RBC: 4.76 MIL/uL (ref 3.87–5.11)
RDW: 14.6 % (ref 11.5–15.5)
WBC: 9.5 10*3/uL (ref 4.0–10.5)
nRBC: 0 % (ref 0.0–0.2)

## 2023-09-05 LAB — GLUCOSE, CAPILLARY
Glucose-Capillary: 233 mg/dL — ABNORMAL HIGH (ref 70–99)
Glucose-Capillary: 107 mg/dL — ABNORMAL HIGH (ref 70–99)
Glucose-Capillary: 121 mg/dL — ABNORMAL HIGH (ref 70–99)

## 2023-09-05 LAB — RENAL FUNCTION PANEL
Albumin: 3.4 g/dL — ABNORMAL LOW (ref 3.5–5.0)
Anion gap: 9 (ref 5–15)
BUN: 19 mg/dL (ref 8–23)
CO2: 25 mmol/L (ref 22–32)
Calcium: 9.4 mg/dL (ref 8.9–10.3)
Chloride: 106 mmol/L (ref 98–111)
Creatinine, Ser: 0.66 mg/dL (ref 0.44–1.00)
GFR, Estimated: >60 mL/min (ref >60)
Glucose, Bld: 199 mg/dL — ABNORMAL HIGH (ref 70–99)
Phosphorus: 3.6 mg/dL (ref 2.5–4.6)
Potassium: 3.8 mmol/L (ref 3.5–5.1)
Sodium: 140 mmol/L (ref 135–145)

## 2023-09-05 LAB — MAGNESIUM: Magnesium: 1.9 mg/dL (ref 1.7–2.4)

## 2023-09-05 MED ORDER — CLONAZEPAM 0.5 MG PO TABS
0.5000 mg | ORAL_TABLET | ORAL | Status: AC
  Administered 2023-09-05: 0.5 mg via ORAL
  Filled 2023-09-05: qty 1

## 2023-09-05 MED ORDER — LINEZOLID 600 MG PO TABS: 600.0000 mg | ORAL_TABLET | Freq: Two times a day (BID) | ORAL | 0 refills | Status: DC

## 2023-09-05 NOTE — Discharge Instructions (Signed)
 DO NOT resume paxil or remeron until 09/11/2023

## 2023-09-05 NOTE — Progress Notes (Signed)
 NAME:  Michelle [name], MRN:  [id number], DOB:  [birth year], LOS: 8 ADMISSION DATE:  08/28/2023, CONSULTATION DATE:  08/28/23 REFERRING MD:  Dr. Scotty Court, CHIEF COMPLAINT:  Respiratory Failure, AECOPD   Brief Pt Description / Synopsis:  63 y.o. 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 To review, patient has had 4-5 days of SOB, wheezing, and productive green sputum, low grade fevers.    Upon arrival to the ER patient was on 6 liters [place], Temp 97.9, HR 152, RR 24, BP 181/158. [name] was exchanged and patient placed on the ventilator. Cultures sent, ceftriaxone, azithromycin, solumedrol, and duonebs given. Labs revealed WBC 15.6, Lactic acid 1.6, Procalcitonin 0.16, K 3.4, glucose 277, Viral panel negative.  CTA thorax negative for PE, mucus plugging of right middle lobe and bilateral lower lobe mucous plugging with consolidations, and emphysema. CT abd/pelvis severe atherosclerotic plaque of the aorta and its branches, no acute pathology noted.   PCCM asked to admit for further workup and treatment.  Please see "Significant Hospital Events" section below for full detailed hospital course.   Pertinent  Medical History  COPD/emphysema/asthma Chronic respiratory failure, trach placed 2019 HTN/HLD/CHF/CAD DM type 2  insulin dependent  Hep C Prior Tobacco and cocaine abuse Anxiety/Depression  Micro Data:  3/26: COVID/FLU/RSV PCR>> negative 3/26: Blood culture x2>> no growth 3/27: Urine>> Staphylococcus Aureus (resistant to Ciprofloxacin and Oxacillin) & Pseudomonas Aeruginosa (resistant to Ciprofloxacin and Gentamicin) 3/27: MRSA PCR>> negative 3/27: RVP>>negative 3/27: Strep pneumo urinary antigen>> negative 3/27: Legionella urinary antigen>> negative 3/29: Tracheal aspirate>>MRSA & Pseudomonas Aeruginosa  Antimicrobials:  Cefepime and azithromycin Anti-infectives (From admission, onward)    Start     Dose/Rate Route Frequency Ordered Stop   09/05/23 0000  linezolid (ZYVOX) 600 MG tablet        600 mg Oral Every 12 hours 09/05/23 0747     09/04/23 1000  linezolid (ZYVOX) tablet 600 mg        600 mg Oral Every 12 hours 09/04/23 0829 09/11/23 0959   09/03/23 0000  vancomycin (VANCOREADY) IVPB 1250 mg/250 mL  Status:  Discontinued        1,250 mg 166.7 mL/hr over 90 Minutes Intravenous Every 24 hours 09/02/23 1215 09/04/23 0829   09/02/23 1300  ceFEPIme (MAXIPIME) 2 g in sodium chloride 0.9 % 100 mL IVPB        2 g 200 mL/hr over 30 Minutes Intravenous Every 8 hours 09/02/23 1217 09/04/23 2159   09/02/23 1000  vancomycin (VANCOREADY) IVPB 1500 mg/300 mL        1,500 mg 150 mL/hr over 120 Minutes Intravenous  Once 09/02/23 0829 09/02/23 1900   09/01/23 2200  azithromycin (ZITHROMAX) tablet 500 mg  Status:  Discontinued        500 mg Oral  Once 09/01/23 0807 09/01/23 0808   09/01/23 2200  azithromycin (ZITHROMAX) 500 mg in sodium chloride  0.9 % 250 mL IVPB        500 mg 250 mL/hr over 60 Minutes Intravenous Every 24 hours 09/01/23 0808 09/02/23 0005   08/29/23 2000  azithromycin (ZITHROMAX) 500 mg in sodium chloride 0.9 % 250 mL IVPB  Status:  Discontinued        500 mg 250 mL/hr over 60 Minutes Intravenous Every 24 hours 08/28/23 2223 09/01/23 0807   08/29/23 2000  cefTRIAXone (ROCEPHIN) 2 g  in sodium chloride 0.9 % 100 mL IVPB  Status:  Discontinued        2 g 200 mL/hr over 30 Minutes Intravenous Every 24 hours 08/28/23 2223 08/29/23 0224   08/29/23 0315  ceFEPIme (MAXIPIME) 2 g in sodium chloride 0.9 % 100 mL IVPB  Status:  Discontinued        2 g 200 mL/hr over 30 Minutes Intravenous Every 8 hours 08/29/23 0224 09/02/23 1217   08/28/23 1900  cefTRIAXone (ROCEPHIN) 2 g in sodium chloride 0.9 % 100 mL IVPB        2 g 200 mL/hr over 30 Minutes Intravenous Once 08/28/23 1845 08/28/23 2000   08/28/23 1900  azithromycin (ZITHROMAX) 500 mg in sodium chloride 0.9 % 250 mL IVPB        500 mg 250 mL/hr over 60 Minutes Intravenous  Once 08/28/23 1845 08/28/23 2107       Significant Hospital Events: Including procedures, antibiotic start and stop dates in addition to other pertinent events   3/26: Admit to ICU on home ventilator with acute on chronic respiratory failure. Cultures pending, broad spectrum antibiotics.  3/27: On minimal vent settings, with intermittent anxiety requesting to resume her home Klonopin.  Discussed with Dr. Belia Heman, will allow her to start diet.  Cultures still pending, continue broad spectrum antibiotics. 3/28: No acute events overnight, on minimal vent support.  Tracheal aspirate preliminary results with Gram + cocci 08/31/2023: No acute issues overnight.  Remains on full vent support.  FiO2 down to 35%.  Remains afebrile. 3/30: No acute issues overnight.  P.o. meds restarted 3/31: No acute issues overnight.  Remains on minimal home vent settings.  Tracheal aspirate and Urine cultures resulted with Staph Aureus and Pseudomonas, broaden ABX to include Vancomycin on top of Cefepime.  D/c steroids. Obtain Echocardiogram. 4/1: No acute events noted overnight, remains on home vent settings. Afebrile, hemodynamically stable, no vasopressors.   No complaints overnight, reports appetite improved.  Pseudomonas found to be multi-drug resistant, consult ID. 4/2: No acute  events overnight.  Pt tolerating home vent settings.  Pt c/o abdominal pain CT Abd Pelvis negative. Pt c/o worsening anxiety  4/3: Pt states she feels much better today and tolerating home vent settings.  Pt stable for discharge home today   Interim History / Subjective:  As outlined above under "Significant Hospital Events" section   Objective   Blood pressure (!) 151/93, pulse 85, temperature 98 F (36.7 C), temperature source Oral, resp. rate (!) 22, height 5' (1.524 m), weight 74.6 kg, last menstrual period 03/06/2005, SpO2 100%.    Vent Mode: PRVC FiO2 (%):  [35 %] 35 % Set Rate:  [16 bmp] 16 bmp Vt Set:  [400 mL] 400 mL PEEP:  [5 cmH20] 5 cmH20 Plateau Pressure:  [15 cmH20-24 cmH20] 24 cmH20   Intake/Output Summary (Last 24 hours) at 09/05/2023 0826 Last data filed at 09/05/2023 0500 Gross per 24 hour  Intake 750.97 ml  Output 950 ml  Net -199.03 ml   [employer]  09/02/23 0500 09/04/23 0500 09/05/23 0541  Weight: 74.1 kg 74.3 kg 74.6 kg    Examination: General: Acute on chronically ill appearing obese female, lying in bed, on mechanical ventilation via chronic trach, no acute distress.  Communicating by mouthing words and nodding HENT: Atraumatic, normocephalic, neck supple, difficult to assess JVD due to body habitus Lungs: Rhonchi throughout, even, non labored  Cardiovascular: Regular rate and rhythm, S1-S2, no murmurs, rubs, gallops. Abdomen: +BS x4, obese, soft, tender, non distended Extremities: Normal bulk and tone, no deformities, no edema, no cyanosis.  Contractures at the level of the ankle left greater than right. Neuro: Awake and alert, oriented x 4, moves all extremities to commands, no focal deficits GU: External female catheter in place, draining normally  Resolved Hospital Problem list   None  Assessment & Plan:   #Acute on chronic hypoxic & hypercapnic respiratory failure  #AECOPD  #Staph aureus & pseudomonas pneumonia #Mucus  plugging-improved PMHx: COPD/Emphysema/Asthma with chronic tracheostomy and home vent. Pseudomonas and Acinetobacter PNA  - Pt tolerating home ventilator  - Implement lung protective strategies - Plateau pressures less than 30 cm H20 - Wean FiO2 & PEEP as tolerated to maintain O2 sats 88 to 92% - Follow intermittent Chest X-ray & ABG as needed - VAP Bundle - Scheduled and prn bronchodilator therapy  - Nebulized steroids   #HTN  Hx: Hyperlipidemia, CAD, and CHF Echo 09/03/23: EF >55%; grade I diastolic dysfunction  - Continuous telemetry monitoring  - Continue scheduled amlodipine and prn labetalol for bp control   #MRSA & colonized pseudomonas in tracheal aspirate #MRSA & pseudomonas UTI  - Trend WBC and monitor fever curve  - Follow cultures  - ID consulted appreciate input - PO linezolid started 04/2  #Type II diabetes mellitus  - CBG's q4hrs - SSI, scheduled novolog, and scheduled semglee  - Target CBG range 140 to 180   #Chronic pain syndrome #Acute abdominal pain~resolved   - Prn percocet and morphine for pain management  - CT Abd Pelvis 04/2: negative for acute abnormality of the abdomen or pelvis   #Anxiety/Depression #Insomnia  - Continue scheduled klonopin; prn melatonin and valium  Best Practice (right click and "Reselect all SmartList Selections" daily)   Diet/type: Regular consistency (see orders) DVT prophylaxis: LMWH GI prophylaxis: H2B Lines: N/A Foley:  N/A Code Status:  full code Last date of multidisciplinary goals of care discussion: 09/05/2023  04/03: Updated pt at bedside and informed she will be discharging home today.  Contacted pts daughter Henslee Lottman via telephone and informed her pt will be discharging home today.  I reviewed discharge instructions with Nakshatra Klose via telephone and all questions answered Labs   CBC: Recent Labs  Lab 09/01/23 0326 09/02/23 1159 09/03/23 0413 09/04/23 0425 09/05/23 0300  WBC 11.6* 13.0* 16.1* 12.2*  9.5  HGB 12.0 12.8 12.7 12.5 12.2  HCT 38.0 40.6 40.1 39.4 39.5  MCV 83.3 82.2 83.5 82.3 83.0  PLT 337 349 341 311 258    Basic Metabolic Panel: Recent Labs  Lab 09/01/23 0326 09/02/23 0434 09/03/23 0413 09/04/23 0425 09/05/23 0300  NA 140 141 141 136 140  K 3.1* 4.0 4.1 3.2* 3.8  CL 99 104 106 101 106  CO2 25 26 27 25 25   GLUCOSE 151* 106* 81 165* 199*  BUN 23 25* 27* 25* 19  CREATININE 0.65 0.65 0.64 0.74  0.70 0.66  CALCIUM 9.0 9.3 9.2 8.7* 9.4  MG 2.0 2.2 2.0 2.3 1.9  PHOS 3.4 2.4*  3.4 2.6 3.6   GFR: Estimated Creatinine Clearance: 65.7 mL/min (by C-G formula based on SCr of 0.66 mg/dL). Recent Labs  Lab 09/02/23 1159 09/03/23 0413 09/04/23 0425 09/05/23 0300  WBC 13.0* 16.1* 12.2* 9.5    Liver Function Tests: Recent Labs  Lab 09/02/23 0434 09/03/23 0413 09/04/23 0425 09/05/23 0300  ALBUMIN 3.2* 3.4* 3.2* 3.4*   No results for input(s): "LIPASE", "AMYLASE" in the last 168 hours. No results for input(s): "AMMONIA" in the last 168 hours.  ABG    Component Value Date/Time   PHART 7.31 (L) 09/30/2021 0500   PCO2ART 46 09/30/2021 0500   PO2ART 155 (H) 09/30/2021 0500   HCO3 24.8 08/29/2023 0307   ACIDBASEDEF 1.5 08/29/2023 0307   O2SAT 97.5 08/29/2023 0307     Coagulation Profile: No results for input(s): "INR", "PROTIME" in the last 168 hours.   Cardiac Enzymes: No results for input(s): "CKTOTAL", "CKMB", "CKMBINDEX", "TROPONINI" in the last 168 hours.  HbA1C: Hemoglobin A1C  Date/Time Value Ref Range Status  06/29/2013 04:02 AM 6.0 4.2 - 6.3 % Final    Comment:    The American Diabetes Association recommends that a primary goal of therapy should be <7% and that physicians should reevaluate the treatment regimen in patients with HbA1c values consistently >8%.    Hgb A1c MFr Bld  Date/Time Value Ref Range Status  08/29/2023 01:01 AM 9.4 (H) 4.8 - 5.6 % Final    Comment:    (NOTE) Pre diabetes:          5.7%-6.4%  Diabetes:               >6.4%  Glycemic control for   <7.0% adults with diabetes   02/19/2023 04:33 AM 10.3 (H) 4.8 - 5.6 % Final    Comment:    (NOTE)         Prediabetes: 5.7 - 6.4         Diabetes: >6.4         Glycemic control for adults with diabetes: <7.0     CBG: Recent Labs  Lab 09/04/23 1650 09/04/23 1919 09/04/23 2311 09/05/23 0344 09/05/23 0745  GLUCAP 122* 141* 152* 233* 107*    Review of Systems:   Positives in BOLD: Gen: anxiety, fever, chills, weight change, fatigue, night sweats HEENT: Denies blurred vision, double vision, hearing loss, tinnitus, sinus congestion, rhinorrhea, sore throat, neck stiffness, dysphagia PULM: Denies shortness of breath, cough, sputum production, hemoptysis, wheezing  CV: Denies chest pain, edema, orthopnea, paroxysmal nocturnal dyspnea, palpitations GI: Denies abdominal pain, nausea, vomiting, diarrhea, hematochezia, melena, constipation, change in bowel habits GU: Denies dysuria, hematuria, polyuria, oliguria, urethral discharge Endocrine: Denies hot or cold intolerance, polyuria, polyphagia or appetite change Derm: Denies rash, dry skin, scaling or peeling skin change Heme: Denies easy bruising, bleeding, bleeding gums Neuro: Denies headache, numbness, weakness, slurred speech, loss of memory or consciousness Extremities: Reports bilateral lower extremity pain  Past Medical History:  She,  has a past medical history of Allergy, Anxiety, Asthma, CHF (congestive heart failure) (HCC), Cocaine abuse (HCC), COPD (chronic obstructive pulmonary disease) (HCC), Coronary artery disease, Emphysema/COPD (HCC), Hepatitis C, High cholesterol, Hypertension, Pneumothorax, and Tobacco abuse.   Surgical History:   Past Surgical History:  Procedure Laterality Date   CARDIAC CATHETERIZATION     CHEST TUBE INSERTION     IR GASTROSTOMY TUBE MOD SED  10/31/2017   TRACHEOSTOMY TUBE PLACEMENT N/A 10/17/2017   Procedure: TRACHEOSTOMY;  Surgeon: Bud Face, MD;   Location: [facility]  ORS;  Service: ENT;  Laterality: N/A;     Social History:   reports that she quit smoking about 6 years ago. Her smoking use included cigarettes. She started smoking about 47 years ago. She has a 4.1 pack-year smoking history. She has never used smokeless tobacco. She reports that she does not currently use drugs after having used the following drugs: "Crack" cocaine. She reports that she does not drink alcohol.   Family History:  Her family history includes Asthma in her mother; Breast cancer (age of onset: 11) in her maternal aunt; CAD in her father; COPD in her sister; CVA in her father; Lung cancer in her mother; Stroke in her father.   Allergies Allergies  Allergen Reactions   Lorazepam     Makes anxiety worse  Other Reaction(s): Makes anxiety worse   Lisinopril Cough   Amoxicillin     Patient has tolerated cephalosporins in the past     Home Medications  Prior to Admission medications   Medication Sig Start Date End Date Taking? Authorizing Provider  acetaminophen (TYLENOL) 325 MG tablet Take 2 tablets (650 mg total) by mouth every 6 (six) hours as needed for mild pain (pain score 1-3) (or Fever >/= 101). 04/24/23   Loyce Dys, MD  albuterol (PROVENTIL) (2.5 MG/3ML) 0.083% nebulizer solution Take 3 mLs (2.5 mg total) by nebulization every 6 (six) hours as needed for wheezing or shortness of breath. 03/27/23   Salena Saner, MD  albuterol (VENTOLIN HFA) 108 (90 Base) MCG/ACT inhaler Inhale 2 puffs into the lungs every 6 (six) hours as needed for wheezing or shortness of breath. 04/02/22   Parrett, Virgel Bouquet, NP  amLODipine (NORVASC) 5 MG tablet Take 5 mg by mouth daily. 08/27/21   [provider]  aspirin 81 MG chewable tablet Chew 81 mg by mouth daily.     [provider]  azelastine (ASTELIN) 0.1 % nasal spray SMARTSIG:1-2 Spray(s) Both Nares Every 12 Hours PRN 09/22/20   [provider]  azithromycin (ZITHROMAX Z-PAK) 250 MG tablet  Take 2 tablets on Day 1 and then 1 tablet daily till gone. 06/17/23   Erin Fulling, MD  benzonatate (TESSALON PERLES) 100 MG capsule Take 1-2 capsules (100-200 mg total) by mouth 3 (three) times daily as needed for cough. 02/22/23   Sunnie Nielsen, DO  Blood Glucose Monitoring Suppl ([employer] BLOOD GLUCOSE METER) w/Device KIT Use as directed to monitor blood glucose up to qid 02/22/23   Sunnie Nielsen, DO  cetirizine (ZYRTEC) 10 MG tablet Take 10 mg by mouth daily. 03/21/22   [provider]  clonazePAM (KLONOPIN) 0.5 MG tablet Take 0.5 mg by mouth in the morning, at noon, and at bedtime.    [provider]  cloNIDine (CATAPRES - DOSED IN MG/24 HR) 0.1 mg/24hr patch Place 0.1 mg onto the skin once a week.    [provider]  D3-50 1.25 MG (50000 UT) capsule Take 50,000 Units by mouth once a week. 12/28/22   [provider]  docusate sodium (COLACE) 100 MG capsule Take 2 capsules (200 mg total) by mouth daily as needed for mild constipation. 04/24/23   Loyce Dys, MD  fluticasone (FLONASE) 50 MCG/ACT nasal spray Place 1 spray into both nostrils daily.    [provider]  guaiFENesin (MUCINEX) 600 MG 12 hr tablet Take 600 mg by mouth 2 (two) times daily.    [provider]  hydrOXYzine (VISTARIL) 25 MG capsule Take 25 mg by mouth  every 6 (six) hours as needed. 10/01/22   [provider]  ipratropium-albuterol (DUONEB) 0.5-2.5 (3) MG/3ML SOLN Take 3 mLs by nebulization every 6 (six) hours as needed (for shortness of breath). 06/17/23   Erin Fulling, MD  LANTUS SOLOSTAR 100 UNIT/ML Solostar Pen Inject 10 Units into the skin at bedtime. 01/03/23   [provider]  levofloxacin (LEVAQUIN) 750 MG tablet Take 1 tablet (750 mg total) by mouth daily. 08/28/23   Erin Fulling, MD  mirtazapine (REMERON) 30 MG tablet Take 30 mg by mouth at bedtime. 01/19/21   [provider]  mupirocin ointment (BACTROBAN) 2 % Apply 1 Application  topically daily. To inside nose and to affected area of axilla 05/21/22   Moye, [place], MD  naloxone [name]) nasal spray 4 mg/0.1 mL Place 1 spray into the nose once. 10/22/22   [provider]  NOVOLOG FLEXPEN 100 UNIT/ML FlexPen Inject into the skin 3 (three) times daily with meals. Sliding scale mealtime insulin. 03/01/21   [provider]  nystatin ointment (MYCOSTATIN) Apply 1 Application topically 3 (three) times daily. 01/04/23   [provider]  oxyCODONE-acetaminophen (PERCOCET/ROXICET) 5-325 MG tablet Take 1 tablet by mouth 3 (three) times daily as needed for pain. 09/07/21   [provider]  PARoxetine (PAXIL) 40 MG tablet Take 40 mg by mouth every morning. Give 30 mg x 1 week (1.5 tab of 20 mg) then start 40 mg daily. D/c 20 mg pills at that time.    [provider]  polyethylene glycol powder (GLYCOLAX/MIRALAX) 17 GM/SCOOP powder Take by mouth.    [provider]  predniSONE (DELTASONE) 20 MG tablet Take 1 tablet (20 mg total) by mouth daily with breakfast. 7 days 06/17/23   Erin Fulling, MD  predniSONE (DELTASONE) 20 MG tablet Take 1 tablet (20 mg total) by mouth daily with breakfast for 10 days. 10 days 08/28/23 09/07/23  Erin Fulling, MD  PULMICORT 0.5 MG/2ML nebulizer solution  02/13/21   [provider]  terbinafine (LAMISIL) 1 % cream Apply 1 application  topically 2 (two) times daily. To affected skin, for 1 week after resolution of rash. # 30 gm, 1 refill.    [provider]  TRULICITY 4.5 MG/0.5ML SOPN Inject 4.5 mg into the skin once a week. 09/28/21   [provider]  Vitamin D, Ergocalciferol, (DRISDOL) 1.25 MG (50000 UNIT) CAPS capsule Take 50,000 Units by mouth 2 (two) times a week.    [provider]  fluconazole (DIFLUCAN) 150 MG tablet Take 1 tablet day 1 repeat in 1 week 11/15/20   Sandi Mealy, MD     Critical care time: 32 minutes    Zada Girt, [facility]  Pulmonary/Critical Care Pager  [phone] (please enter 7 digits) PCCM Consult Pager [phone] (please enter 7 digits)

## 2023-09-05 NOTE — Plan of Care (Signed)
  Problem: Respiratory: Goal: Ability to maintain a clear airway and adequate ventilation will improve Outcome: Progressing   Problem: Role Relationship: Goal: Method of communication will improve Outcome: Progressing   Problem: Education: Goal: Knowledge of General Education information will improve Description: Including pain rating scale, medication(s)/side effects and non-pharmacologic comfort measures Outcome: Progressing   Problem: Health Behavior/Discharge Planning: Goal: Ability to manage health-related needs will improve Outcome: Progressing   Problem: Clinical Measurements: Goal: Diagnostic test results will improve Outcome: Progressing Goal: Respiratory complications will improve Outcome: Progressing Goal: Cardiovascular complication will be avoided Outcome: Progressing   Problem: Coping: Goal: Level of anxiety will decrease Outcome: Progressing   Problem: Pain Managment: Goal: General experience of comfort will improve and/or be controlled Outcome: Progressing   Problem: Safety: Goal: Ability to remain free from injury will improve Outcome: Progressing

## 2023-09-05 NOTE — TOC Transition Note (Addendum)
 Transition of Care [facility]) - Discharge Note   Patient Details  Name: Michelle [name] MRN: [id number] Date of Birth: [birth year]  [facility] CM/SW Contact:  Margarito Liner, LCSW Phone Number: 09/05/2023, 11:02 AM   Clinical Narrative:   Patient has orders to discharge home today. CSW faxed outpatient therapy referral to [facility]. CSW called [facility] and confirmed that transport is still arranged for 2:00. RN and daughter are aware. No further concerns. CSW signing off.  Final next level of care: OP Rehab Barriers to Discharge: Barriers Resolved   Patient Goals and CMS Choice   CMS Medicare.gov Compare Post Acute Care list provided to:: Patient Choice offered to / list presented to : Patient      Discharge Placement                Patient to be transferred to facility by: [facility] Name of family member notified: Thedore Mins Patient and family notified of of transfer: 09/05/23  Discharge Plan and Services Additional resources added to the After Visit Summary for     Discharge Planning Services: CM Consult Post Acute Care Choice:  (Patient has Private duty nursing)            DME Agency:  (Patient has home vent and trach supplies via [employer])                  Social Drivers of Health (SDOH) Interventions SDOH Screenings   Food Insecurity: Patient Unable To Answer (08/30/2023)  Housing: Patient Unable To Answer (08/30/2023)  Transportation Needs: Patient Unable To Answer (08/30/2023)  Utilities: Patient Unable To Answer (08/30/2023)  Financial Resource Strain: Medium Risk (06/29/2017)  Physical Activity: Inactive (06/29/2017)  Social Connections: Unknown (06/29/2017)  Stress: Stress Concern Present (06/29/2017)  Tobacco Use: Medium Risk (08/28/2023)     Readmission Risk Interventions    04/19/2023    2:10 PM 02/22/2023    1:47 PM 10/03/2021    3:58 PM  Readmission Risk Prevention Plan  Transportation Screening Complete Complete  Complete  PCP or Specialist Appt within 3-5 Days Complete Complete Complete  HRI or Home Care Consult Complete Complete   Social Work Consult for Recovery Care Planning/Counseling Complete Complete Complete  Palliative Care Screening Complete Complete Not Applicable  Medication Review Oceanographer) Complete Complete Complete

## 2023-09-05 NOTE — Consult Note (Signed)
 PHARMACY CONSULT NOTE - ELECTROLYTES  Pharmacy Consult for Electrolyte Monitoring and Replacement   Recent Labs: Height: 5' (152.4 cm) Weight: 74.6 kg (164 lb 7.4 oz) IBW/kg (Calculated) : 45.5 Estimated Creatinine Clearance: 65.7 mL/min (by C-G formula based on SCr of 0.66 mg/dL). Potassium (mmol/L)  Date Value  09/05/2023 3.8  07/11/2014 4.6   Magnesium (mg/dL)  Date Value  16/03/9603 1.9  10/31/2013 2.6 (H)   Calcium (mg/dL)  Date Value  54/02/8118 9.4   Calcium, Total (mg/dL)  Date Value  14/78/2956 8.9   Albumin (g/dL)  Date Value  21/30/8657 3.4 (L)  07/09/2014 3.7   Phosphorus (mg/dL)  Date Value  84/69/6295 3.6  10/31/2013 4.6   Sodium (mmol/L)  Date Value  09/05/2023 140  07/11/2014 139    Assessment  Michelle [name] is a 63 y.o. female presenting with SOB, wheezing, and productive green sputum. Pharmacy has been consulted to monitor and replace electrolytes while under CCM care.  Goal of Therapy: Electrolytes WNL  Plan:  ---no electrolyte replacement warranted for today ---patient is scheduled for discharge home this afternoon: no further intervention   Thank you for allowing pharmacy to be a part of this patient's care.  Lowella Bandy, PharmD Clinical Pharmacist 09/05/2023 7:17 AM

## 2023-09-05 NOTE — Progress Notes (Addendum)
 Michelle [name]  A and O x 4. VSS. Pt tolerating diet well. No complaints of pain or nausea. IV removed intact, prescriptions given. Pt voiced understanding of discharge instructions with no further questions. Pt discharged via EMS.    Allergies as of 09/05/2023       Reactions   Lorazepam    Makes anxiety worse Other Reaction(s): Makes anxiety worse   Lisinopril Cough   Amoxicillin    Patient has tolerated cephalosporins in the past        Medication List     STOP taking these medications    levofloxacin 750 MG tablet Commonly known as: Levaquin   mirtazapine 30 MG tablet Commonly known as: REMERON   PARoxetine 40 MG tablet Commonly known as: PAXIL       TAKE these medications    acetaminophen 325 MG tablet Commonly known as: TYLENOL Take 2 tablets (650 mg total) by mouth every 6 (six) hours as needed for mild pain (pain score 1-3) (or Fever >/= 101).   albuterol 108 (90 Base) MCG/ACT inhaler Commonly known as: VENTOLIN HFA Inhale 2 puffs into the lungs every 6 (six) hours as needed for wheezing or shortness of breath.   albuterol (2.5 MG/3ML) 0.083% nebulizer solution Commonly known as: PROVENTIL Take 3 mLs (2.5 mg total) by nebulization every 6 (six) hours as needed for wheezing or shortness of breath.   amLODipine 5 MG tablet Commonly known as: NORVASC Take 5 mg by mouth daily.   aspirin 81 MG chewable tablet Chew 81 mg by mouth daily.   azelastine 0.1 % nasal spray Commonly known as: ASTELIN SMARTSIG:1-2 Spray(s) Both Nares Every 12 Hours PRN   cetirizine 10 MG tablet Commonly known as: ZYRTEC Take 10 mg by mouth daily.   clonazePAM 0.5 MG tablet Commonly known as: KLONOPIN Take 0.5 mg by mouth in the morning, at noon, and at bedtime.   cloNIDine 0.1 mg/24hr patch Commonly known as: CATAPRES - Dosed in mg/24 hr Place 0.1 mg onto the skin once a week.   [employer] Blood Glucose Meter w/Device Kit Use as directed to monitor blood glucose up to qid    docusate sodium 100 MG capsule Commonly known as: COLACE Take 2 capsules (200 mg total) by mouth daily as needed for mild constipation.   fluticasone 50 MCG/ACT nasal spray Commonly known as: FLONASE Place 1 spray into both nostrils daily.   guaiFENesin 600 MG 12 hr tablet Commonly known as: MUCINEX Take 600 mg by mouth 2 (two) times daily.   hydrOXYzine 25 MG capsule Commonly known as: VISTARIL Take 25 mg by mouth every 6 (six) hours as needed.   ipratropium-albuterol 0.5-2.5 (3) MG/3ML Soln Commonly known as: DUONEB Take 3 mLs by nebulization every 6 (six) hours as needed (for shortness of breath).   Lantus SoloStar 100 UNIT/ML Solostar Pen Generic drug: insulin glargine Inject 40 Units into the skin at bedtime.   linezolid 600 MG tablet Commonly known as: ZYVOX Take 1 tablet (600 mg total) by mouth every 12 (twelve) hours.   mupirocin ointment 2 % Commonly known as: BACTROBAN Apply 1 Application topically daily. To inside nose and to affected area of axilla   naloxone 4 MG/0.1ML Liqd nasal spray kit Commonly known as: NARCAN Place 1 spray into the nose once.   NovoLOG FlexPen 100 UNIT/ML FlexPen Generic drug: insulin aspart Inject 5-10 Units into the skin 3 (three) times daily with meals. Sliding scale mealtime insulin.   nystatin ointment Commonly known as:  MYCOSTATIN Apply 1 Application topically 3 (three) times daily.   oxyCODONE-acetaminophen 5-325 MG tablet Commonly known as: PERCOCET/ROXICET Take 1 tablet by mouth 3 (three) times daily as needed for pain.   polyethylene glycol powder 17 GM/SCOOP powder Commonly known as: GLYCOLAX/MIRALAX Take by mouth.   predniSONE 20 MG tablet Commonly known as: DELTASONE Take 1 tablet (20 mg total) by mouth daily with breakfast for 10 days. 10 days   Pulmicort 0.5 MG/2ML nebulizer solution Generic drug: budesonide Take 0.5 mg by nebulization every 12 (twelve) hours.   terbinafine 1 % cream Commonly known as:  LAMISIL Apply 1 application  topically 2 (two) times daily. To affected skin, for 1 week after resolution of rash. # 30 gm, 1 refill.   Trulicity 4.5 MG/0.5ML Soaj Generic drug: Dulaglutide Inject 4.5 mg into the skin once a week.   Vitamin D (Ergocalciferol) 1.25 MG (50000 UNIT) Caps capsule Commonly known as: DRISDOL Take 50,000 Units by mouth every 7 (seven) days.        Vitals:   09/05/23 1300 09/05/23 1400  BP: (!) 175/88 (!) 135/120  Pulse: 91 82  Resp: 18   Temp:    SpO2: 100% 100%    Suzzanne Cloud

## 2023-09-05 NOTE — Discharge Summary (Addendum)
 Physician Discharge Summary  Patient ID: Michelle [name] MRN: [id number] DOB/AGE: 62-May-1962 62 y.o.  Admit date: 08/28/2023 Discharge date: 09/05/2023    Discharge Diagnoses:  Chronic Hypoxic Respiratory Failure  Ventilator Associate Pneumonia  Acute COPD Exacerbation  Asthma  Ventilator Dependant via Chronic Tracheostomy  UTI Anxiety  Depression  HTN  Type II Diabetes Mellitus  Chronic Pain Syndrome                                                                       DISCHARGE PLAN BY DIAGNOSIS   #Chronic Hypoxic Respiratory Failure  #Ventilator Associate Pneumonia  #Ventilator Dependant via Chronic Tracheostomy #Acute COPD Exacerbation  #Asthma  Plan: - Continue home vent with same settings  - Resume outpatient bronchodilator therapy  - Resume outpatient guaifenesin  - Continue linezolid 600 mg every 12 hours stop date 09/11/23 - Follow-up with PCP in 1-2 weeks   #HTN  #CAD  Plan: - Resume outpatient aspirin, amlodipine, and clonidine   #Anxiety  #Depression  Plan: - DO NOT resume outpatient mirtazapine or paroxetine until 09/11/23 - Resume outpatient clonazepam   #Chronic pain syndrome - Resume prn percocet for pain   #Type II diabetes mellitus  - Resume outpatient lantus, novolog, and trulicity                 DISCHARGE SUMMARY   Michelle [name] is a 63 y.o. y/o female with a PMH of significant for MRSA and Pseudomonas pneumonia, Atypical pneumonia, COPD/Emphysema/Asthma, 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, HTN, HLD, CHF, CAD s/p STEMI s/ stent 2009, DM type 2 w/ insulin dependence, Hepatitis C, Former tobacco and cocaine use, Anxiety/Depression, and Obesity.  She was admitted to [facility] ICU on 08/28/23 with acute on chronic hypoxic and hypercapnic respiratory failure in the setting of acute COPD exacerbation, mucus plugging, MRSA and pseudomonas pneumonia & UTI. See detailed hospital course below under significant events.   SIGNIFICANT EVENTS 3/26: Admit to ICU  on home ventilator with acute on chronic respiratory failure. Cultures pending, broad spectrum antibiotics.  3/27: On minimal vent settings, with intermittent anxiety requesting to resume her home Klonopin.  Discussed with Dr. Belia Heman, will allow her to start diet.  Cultures still pending, continue broad spectrum antibiotics. 3/28: No acute events overnight, on minimal vent support.  Tracheal aspirate preliminary results with Gram + cocci 08/31/2023: No acute issues overnight.  Remains on full vent support.  FiO2 down to 35%.  Remains afebrile. 3/30: No acute issues overnight.  P.o. meds restarted 3/31: No acute issues overnight.  Remains on minimal home vent settings.  Tracheal aspirate and Urine cultures resulted with Staph Aureus and Pseudomonas, broaden ABX to include Vancomycin on top of Cefepime.  D/c steroids. Obtain Echocardiogram. 4/1: No acute events noted overnight, remains on home vent settings. Afebrile, hemodynamically stable, no vasopressors.   No complaints overnight, reports appetite improved.  Pseudomonas found to be multi-drug resistant, consult ID. 4/2: No acute events overnight.  Pt tolerating home vent settings.  Pt c/o abdominal pain CT Abd Pelvis negative for acute abnormality.    MICRO DATA  3/26: COVID/FLU/RSV PCR>> negative 3/26: Blood culture x2>> no growth 3/27: Urine>> Staphylococcus Aureus (resistant to Ciprofloxacin and Oxacillin) & Pseudomonas Aeruginosa (resistant to  Ciprofloxacin and Gentamicin) 3/27: MRSA PCR>> negative 3/27: RVP>>negative 3/27: Strep pneumo urinary antigen>> negative 3/27: Legionella urinary antigen>> negative 3/29: Tracheal aspirate>>MRSA & Pseudomonas Aeruginosa  ANTIBIOTICS Anti-infectives (From admission, onward)    Start     Dose/Rate Route Frequency Ordered Stop   09/05/23 0000  linezolid (ZYVOX) 600 MG tablet        600 mg Oral Every 12 hours 09/05/23 0747     09/04/23 1000  linezolid (ZYVOX) tablet 600 mg        600 mg Oral Every 12  hours 09/04/23 0829 09/11/23 0959   09/03/23 0000  vancomycin (VANCOREADY) IVPB 1250 mg/250 mL  Status:  Discontinued        1,250 mg 166.7 mL/hr over 90 Minutes Intravenous Every 24 hours 09/02/23 1215 09/04/23 0829   09/02/23 1300  ceFEPIme (MAXIPIME) 2 g in sodium chloride 0.9 % 100 mL IVPB        2 g 200 mL/hr over 30 Minutes Intravenous Every 8 hours 09/02/23 1217 09/04/23 2159   09/02/23 1000  vancomycin (VANCOREADY) IVPB 1500 mg/300 mL        1,500 mg 150 mL/hr over 120 Minutes Intravenous  Once 09/02/23 0829 09/02/23 1900   09/01/23 2200  azithromycin (ZITHROMAX) tablet 500 mg  Status:  Discontinued        500 mg Oral  Once 09/01/23 0807 09/01/23 0808   09/01/23 2200  azithromycin (ZITHROMAX) 500 mg in sodium chloride 0.9 % 250 mL IVPB        500 mg 250 mL/hr over 60 Minutes Intravenous Every 24 hours 09/01/23 0808 09/02/23 0005   08/29/23 2000  azithromycin (ZITHROMAX) 500 mg in sodium chloride 0.9 % 250 mL IVPB  Status:  Discontinued        500 mg 250 mL/hr over 60 Minutes Intravenous Every 24 hours 08/28/23 2223 09/01/23 0807   08/29/23 2000  cefTRIAXone (ROCEPHIN) 2 g in sodium chloride 0.9 % 100 mL IVPB  Status:  Discontinued        2 g 200 mL/hr over 30 Minutes Intravenous Every 24 hours 08/28/23 2223 08/29/23 0224   08/29/23 0315  ceFEPIme (MAXIPIME) 2 g in sodium chloride 0.9 % 100 mL IVPB  Status:  Discontinued        2 g 200 mL/hr over 30 Minutes Intravenous Every 8 hours 08/29/23 0224 09/02/23 1217   08/28/23 1900  cefTRIAXone (ROCEPHIN) 2 g in sodium chloride 0.9 % 100 mL IVPB        2 g 200 mL/hr over 30 Minutes Intravenous Once 08/28/23 1845 08/28/23 2000   08/28/23 1900  azithromycin (ZITHROMAX) 500 mg in sodium chloride 0.9 % 250 mL IVPB        500 mg 250 mL/hr over 60 Minutes Intravenous  Once 08/28/23 1845 08/28/23 2107      Discharge Exam: General: Well-developed, well-nourished, obese female, lying in bed, on mechanical ventilation via chronic trach, no  acute distress. Communicating by mouthing words and nodding HENT: Atraumatic, normocephalic, neck supple, difficult to assess JVD due to body habitus Lungs: Diminished throughout, even, non labored  Cardiovascular: Regular rate and rhythm, S1-S2, no murmurs, rubs, gallops. 2+ radial/2+ distal, no edema  Abdomen: +BS x4, obese, soft, non tender, non distended Extremities: Normal bulk and tone, no deformities, no edema, no cyanosis. Contractures at the level of the ankle left greater than right. Neuro: Awake and alert, oriented x 4, moves all extremities to commands, no focal deficits  Vitals:   09/05/23 0500  09/05/23 0541 09/05/23 0600 09/05/23 0700  BP: 121/74  130/76 108/80  Pulse: 76  74 75  Resp: (!) 22  19 16   Temp:      TempSrc:      SpO2: 100%  100% 100%  Weight:  74.6 kg    Height:         Discharge Labs  BMET Recent Labs  Lab 09/01/23 0326 09/02/23 0434 09/03/23 0413 09/04/23 0425 09/05/23 0300  NA 140 141 141 136 140  K 3.1* 4.0 4.1 3.2* 3.8  CL 99 104 106 101 106  CO2 25 26 27 25 25   GLUCOSE 151* 106* 81 165* 199*  BUN 23 25* 27* 25* 19  CREATININE 0.65 0.65 0.64 0.74  0.70 0.66  CALCIUM 9.0 9.3 9.2 8.7* 9.4  MG 2.0 2.2 2.0 2.3 1.9  PHOS 3.4 2.4* 3.4 2.6 3.6    CBC Recent Labs  Lab 09/03/23 0413 09/04/23 0425 09/05/23 0300  HGB 12.7 12.5 12.2  HCT 40.1 39.4 39.5  WBC 16.1* 12.2* 9.5  PLT 341 311 258    Anti-Coagulation No results for input(s): "INR" in the last 168 hours.      Follow-up Information     Abram Sander, MD Follow up in 2 week(s).   Specialty: Family Medicine Contact information: [address] [place] [address] [phone]                  Allergies as of 09/05/2023       Reactions   Lorazepam    Makes anxiety worse Other Reaction(s): Makes anxiety worse   Lisinopril Cough   Amoxicillin    Patient has tolerated cephalosporins in the past        Medication List     STOP taking these  medications    levofloxacin 750 MG tablet Commonly known as: Levaquin   mirtazapine 30 MG tablet Commonly known as: REMERON   PARoxetine 40 MG tablet Commonly known as: PAXIL       TAKE these medications    acetaminophen 325 MG tablet Commonly known as: TYLENOL Take 2 tablets (650 mg total) by mouth every 6 (six) hours as needed for mild pain (pain score 1-3) (or Fever >/= 101).   albuterol 108 (90 Base) MCG/ACT inhaler Commonly known as: VENTOLIN HFA Inhale 2 puffs into the lungs every 6 (six) hours as needed for wheezing or shortness of breath.   albuterol (2.5 MG/3ML) 0.083% nebulizer solution Commonly known as: PROVENTIL Take 3 mLs (2.5 mg total) by nebulization every 6 (six) hours as needed for wheezing or shortness of breath.   amLODipine 5 MG tablet Commonly known as: NORVASC Take 5 mg by mouth daily.   aspirin 81 MG chewable tablet Chew 81 mg by mouth daily.   azelastine 0.1 % nasal spray Commonly known as: ASTELIN SMARTSIG:1-2 Spray(s) Both Nares Every 12 Hours PRN   cetirizine 10 MG tablet Commonly known as: ZYRTEC Take 10 mg by mouth daily.   clonazePAM 0.5 MG tablet Commonly known as: KLONOPIN Take 0.5 mg by mouth in the morning, at noon, and at bedtime.   cloNIDine 0.1 mg/24hr patch Commonly known as: CATAPRES - Dosed in mg/24 hr Place 0.1 mg onto the skin once a week.   CVS Blood Glucose Meter w/Device Kit Use as directed to monitor blood glucose up to qid   docusate sodium 100 MG capsule Commonly known as: COLACE Take 2 capsules (200 mg total) by mouth daily as needed for  mild constipation.   fluticasone 50 MCG/ACT nasal spray Commonly known as: FLONASE Place 1 spray into both nostrils daily.   guaiFENesin 600 MG 12 hr tablet Commonly known as: MUCINEX Take 600 mg by mouth 2 (two) times daily.   hydrOXYzine 25 MG capsule Commonly known as: VISTARIL Take 25 mg by mouth every 6 (six) hours as needed.   ipratropium-albuterol 0.5-2.5  (3) MG/3ML Soln Commonly known as: DUONEB Take 3 mLs by nebulization every 6 (six) hours as needed (for shortness of breath).   Lantus SoloStar 100 UNIT/ML Solostar Pen Generic drug: insulin glargine Inject 40 Units into the skin at bedtime.   linezolid 600 MG tablet Commonly known as: ZYVOX Take 1 tablet (600 mg total) by mouth every 12 (twelve) hours.   mupirocin ointment 2 % Commonly known as: BACTROBAN Apply 1 Application topically daily. To inside nose and to affected area of axilla   naloxone 4 MG/0.1ML Liqd nasal spray kit Commonly known as: NARCAN Place 1 spray into the nose once.   NovoLOG FlexPen 100 UNIT/ML FlexPen Generic drug: insulin aspart Inject 5-10 Units into the skin 3 (three) times daily with meals. Sliding scale mealtime insulin.   nystatin ointment Commonly known as: MYCOSTATIN Apply 1 Application topically 3 (three) times daily.   oxyCODONE-acetaminophen 5-325 MG tablet Commonly known as: PERCOCET/ROXICET Take 1 tablet by mouth 3 (three) times daily as needed for pain.   polyethylene glycol powder 17 GM/SCOOP powder Commonly known as: GLYCOLAX/MIRALAX Take by mouth.   predniSONE 20 MG tablet Commonly known as: DELTASONE Take 1 tablet (20 mg total) by mouth daily with breakfast for 10 days. 10 days   Pulmicort 0.5 MG/2ML nebulizer solution Generic drug: budesonide Take 0.5 mg by nebulization every 12 (twelve) hours.   terbinafine 1 % cream Commonly known as: LAMISIL Apply 1 application  topically 2 (two) times daily. To affected skin, for 1 week after resolution of rash. # 30 gm, 1 refill.   Trulicity 4.5 MG/0.5ML Soaj Generic drug: Dulaglutide Inject 4.5 mg into the skin once a week.   Vitamin D (Ergocalciferol) 1.25 MG (50000 UNIT) Caps capsule Commonly known as: DRISDOL Take 50,000 Units by mouth every 7 (seven) days.         Disposition: Home (reviewed discharge instructions with pts daughter Santasia Rew via telephone on  09/05/2023)  Discharged Condition: MALAN WERK has met maximum benefit of inpatient care and is medically stable and cleared for discharge.  Patient is pending follow up as above.     Zada Girt, AGNP  Pulmonary/Critical Care Pager [phone] (please enter 7 digits) PCCM Consult Pager [phone] (please enter 7 digits)

## 2023-11-07 ENCOUNTER — Ambulatory Visit: Admitting: Dermatology

## 2023-11-07 ENCOUNTER — Encounter: Payer: Self-pay | Admitting: Dermatology

## 2023-11-07 DIAGNOSIS — L02411 Cutaneous abscess of right axilla: Secondary | ICD-10-CM | POA: Diagnosis not present

## 2023-11-07 DIAGNOSIS — L732 Hidradenitis suppurativa: Secondary | ICD-10-CM

## 2023-11-07 DIAGNOSIS — L0291 Cutaneous abscess, unspecified: Secondary | ICD-10-CM

## 2023-11-07 MED ORDER — DOXYCYCLINE MONOHYDRATE 100 MG PO CAPS
100.0000 mg | ORAL_CAPSULE | Freq: Two times a day (BID) | ORAL | 0 refills | Status: AC
Start: 2023-11-07 — End: 2023-11-17

## 2023-11-07 NOTE — Progress Notes (Signed)
   Follow-Up Visit   Subjective  Michelle [name] is a 63 y.o. female who presents for the following: boils bil axilla, ~1.5 weeks, painful, not draining, using otc boil relief and antibiotic cream  The patient has spots, moles and lesions to be evaluated, some may be new or changing and the patient may have concern these could be cancer.  Patient accompanied by daughter who contributes to history.  The following portions of the chart were reviewed this encounter and updated as appropriate: medications, allergies, medical history  Review of Systems:  No other skin or systemic complaints except as noted in HPI or Assessment and Plan.  Objective  Well appearing patient in no apparent distress; mood and affect are within normal limits.   A focused examination was performed of the following areas: Bil axilla  Relevant exam findings are noted in the Assessment and Plan.  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 Concerning for hidradenitis suppurativa  Discussed that lesions appear to be inflammatory nodules rather than abscesses. Do not expect significant drainage. Patient and daughter requested drainage be attempted. No purulent drainage was expressed.  Start Doxycycline  100mg   bid for 10 days take with food and drink  Doxycycline  should be taken with food to prevent nausea. Do not lay down for 30 minutes after taking. Be cautious with sun exposure and use good sun protection while on this medication. Pregnant women should not take this medication.   Discuss HS treatment at follow up  Incision and Drainage - Right Axilla Location: R axilla   Informed Consent: Discussed risks (permanent scarring, light or dark discoloration, infection, pain, bleeding, bruising, redness, damage to adjacent structures, and recurrence of the lesion)  and benefits of the procedure, as well as the alternatives.  Informed consent was obtained.  Preparation: The area was prepped with alcohol.  Anesthesia: Lidocaine  1% without epinephrine   Procedure Details: An incision was made overlying the lesion. The lesion drained blood. No purulence for bacterial culture  4.0 prolene suture x 1   Antibiotic ointment and a sterile pressure dressing were applied. The patient tolerated procedure well.  Total number of lesions drained: 1  Plan: The patient was instructed on post-op care. Recommend OTC analgesia as needed for pain. Suture removal at follow up  HIDRADENITIS SUPPURATIVA    Return in about 10 days (around 11/17/2023) for f/u abscess, suture removal and discuss HS treatment.  I, Rollie Clipper, RMA, am acting as scribe for Harris Liming, MD .   Documentation: I have reviewed the above documentation for accuracy and completeness, and I agree with the above.  Harris Liming, MD

## 2023-11-07 NOTE — Patient Instructions (Addendum)
 Wound Care Instructions  Cleanse wound gently with soap and water once a day then pat dry with clean gauze. Apply a thin coat of Petrolatum (petroleum jelly, "Vaseline") over the wound (unless you have an allergy to this). We recommend that you use a new, sterile tube of Vaseline. Do not pick or remove scabs. Do not remove the yellow or white "healing tissue" from the base of the wound.  Cover the wound with fresh, clean, nonstick gauze and secure with paper tape. You may use Band-Aids in place of gauze and tape if the wound is small enough, but would recommend trimming much of the tape off as there is often too much. Sometimes Band-Aids can irritate the skin.  You should call the office for your biopsy report after 1 week if you have not already been contacted.  If you experience any problems, such as abnormal amounts of bleeding, swelling, significant bruising, significant pain, or evidence of infection, please call the office immediately.  FOR ADULT SURGERY PATIENTS: If you need something for pain relief you may take 1 extra strength Tylenol (acetaminophen) AND 2 Ibuprofen (200mg  each) together every 4 hours as needed for pain. (do not take these if you are allergic to them or if you have a reason you should not take them.) Typically, you may only need pain medication for 1 to 3 days.         Due to recent changes in healthcare laws, you may see results of your pathology and/or laboratory studies on MyChart before the doctors have had a chance to review them. We understand that in some cases there may be results that are confusing or concerning to you. Please understand that not all results are received at the same time and often the doctors may need to interpret multiple results in order to provide you with the best plan of care or course of treatment. Therefore, we ask that you please give [place] 2 business days to thoroughly review all your results before contacting the office for clarification.  Should we see a critical lab result, you will be contacted sooner.   If You Need Anything After Your Visit  If you have any questions or concerns for your doctor, please call our main line at [phone] and press option 4 to reach your doctor's medical assistant. If no one answers, please leave a voicemail as directed and we will return your call as soon as possible. Messages left after 4 pm will be answered the following business day.   You may also send [place] a message via MyChart. We typically respond to MyChart messages within 1-2 business days.  For prescription refills, please ask your pharmacy to contact our office. Our fax number is [phone].  If you have an urgent issue when the clinic is closed that cannot wait until the next business day, you can page your doctor at the number below.    Please note that while we do our best to be available for urgent issues outside of office hours, we are not available 24/7.   If you have an urgent issue and are unable to reach [place], you may choose to seek medical care at your doctor's office, retail clinic, urgent care center, or emergency room.  If you have a medical emergency, please immediately call 911 or go to the emergency department.  Pager Numbers  - Dr. Gwen Pounds: [phone]  - Dr. Roseanne Reno: [phone]  - Dr. Katrinka Blazing: [phone]   In the event of inclement weather, please  call our main line at [phone] for an update on the status of any delays or closures.  Dermatology Medication Tips: Please keep the boxes that topical medications come in in order to help keep track of the instructions about where and how to use these. Pharmacies typically print the medication instructions only on the boxes and not directly on the medication tubes.   If your medication is too expensive, please contact our office at [phone] option 4 or send [place] a message through MyChart.   We are unable to tell what your co-pay for medications will be  in advance as this is different depending on your insurance coverage. However, we may be able to find a substitute medication at lower cost or fill out paperwork to get insurance to cover a needed medication.   If a prior authorization is required to get your medication covered by your insurance company, please allow [place] 1-2 business days to complete this process.  Drug prices often vary depending on where the prescription is filled and some pharmacies may offer cheaper prices.  The website www.goodrx.com contains coupons for medications through different pharmacies. The prices here do not account for what the cost may be with help from insurance (it may be cheaper with your insurance), but the website can give you the price if you did not use any insurance.  - You can print the associated coupon and take it with your prescription to the pharmacy.  - You may also stop by our office during regular business hours and pick up a GoodRx coupon card.  - If you need your prescription sent electronically to a different pharmacy, notify our office through [facility] or by phone at [phone] option 4.     Si Usted Necesita Algo Despus de Su Visita  Tambin puede enviarnos un mensaje a travs de Clinical cytogeneticist. Por lo general respondemos a los mensajes de MyChart en el transcurso de 1 a 2 das hbiles.  Para renovar recetas, por favor pida a su farmacia que se ponga en contacto con nuestra oficina. Annie Sable de fax es [place] [phone].  Si tiene un asunto urgente cuando la clnica est cerrada y que no puede esperar hasta el siguiente da hbil, puede llamar/localizar a su doctor(a) al nmero que aparece a continuacin.   Por favor, tenga en cuenta que aunque hacemos todo lo posible para estar disponibles para asuntos urgentes fuera del horario de [place], no estamos disponibles las 24 horas del da, los 7 [address]  [address] de la [place].   Si tiene un problema urgente y no puede comunicarse con nosotros,  puede optar por buscar atencin mdica  en el consultorio de su doctor(a), en una clnica privada, en un centro de atencin urgente o en una sala de emergencias.  Si tiene Engineer, drilling, por favor llame inmediatamente al 911 o vaya a la sala de emergencias.  Nmeros de bper  - Dr. Gwen Pounds: [phone]  - Dra. Roseanne Reno: [phone]  - Dr. Katrinka Blazing: [phone]   En caso de inclemencias del tiempo, por favor llame a Lacy Duverney principal al [phone] para una actualizacin sobre el [place] de cualquier retraso o cierre.  Consejos para la medicacin en dermatologa: Por favor, guarde las cajas en las que vienen los medicamentos de uso tpico para ayudarle a seguir las instrucciones sobre dnde y cmo usarlos. Las farmacias generalmente imprimen las instrucciones del medicamento slo en las cajas y no directamente en los tubos del [place].   Si su medicamento es [employer],  por favor, pngase en contacto con nuestra oficina llamando al [phone] y presione la opcin 4 o envenos un mensaje a travs de Clinical cytogeneticist.   No podemos decirle cul ser su copago por los medicamentos por adelantado ya que esto es diferente dependiendo de la cobertura de su seguro. Sin embargo, es posible que podamos encontrar un medicamento sustituto a Audiological scientist un formulario para que el seguro cubra el medicamento que se considera necesario.   Si se requiere una autorizacin previa para que su compaa de seguros [place] su medicamento, por favor permtanos de 1 a 2 das hbiles para completar [address]  Los precios de los medicamentos varan con frecuencia dependiendo del Environmental consultant de dnde se surte la receta y alguna farmacias pueden ofrecer precios ms baratos.  El sitio web www.goodrx.com tiene cupones para medicamentos de Health and safety inspector. Los precios aqu no tienen en cuenta lo que podra costar con la ayuda del seguro (puede ser ms barato con su seguro), pero el sitio web puede darle  el precio si no utiliz Tourist information centre manager.  - Puede imprimir el cupn correspondiente y llevarlo con su receta a la farmacia.  - Tambin puede pasar por nuestra oficina durante el horario de atencin regular y Education officer, museum una tarjeta de cupones de GoodRx.  - Si necesita que su receta se enve electrnicamente a una farmacia diferente, informe a nuestra oficina a travs de MyChart de [place] o por telfono llamando al [phone] y presione la opcin 4.

## 2023-11-18 ENCOUNTER — Encounter: Payer: Self-pay | Admitting: Dermatology

## 2023-11-18 ENCOUNTER — Ambulatory Visit: Admitting: Dermatology

## 2023-11-18 DIAGNOSIS — L732 Hidradenitis suppurativa: Secondary | ICD-10-CM | POA: Diagnosis not present

## 2023-11-18 DIAGNOSIS — Z5181 Encounter for therapeutic drug level monitoring: Secondary | ICD-10-CM | POA: Diagnosis not present

## 2023-11-18 DIAGNOSIS — Z7189 Other specified counseling: Secondary | ICD-10-CM

## 2023-11-18 DIAGNOSIS — Z79899 Other long term (current) drug therapy: Secondary | ICD-10-CM

## 2023-11-18 DIAGNOSIS — Z4802 Encounter for removal of sutures: Secondary | ICD-10-CM

## 2023-11-18 DIAGNOSIS — Z792 Long term (current) use of antibiotics: Secondary | ICD-10-CM

## 2023-11-18 MED ORDER — DOXYCYCLINE MONOHYDRATE 100 MG PO CAPS: 100.0000 mg | ORAL_CAPSULE | Freq: Two times a day (BID) | ORAL | 2 refills | Status: DC

## 2023-11-18 NOTE — Patient Instructions (Signed)

## 2023-11-18 NOTE — Progress Notes (Signed)
 Follow Up Visit   Subjective  Michelle [name] is a 63 y.o. female who presents for the following: bumps  - pt here today for suture removal, and to discuss treatment options for HS. Pt finished Doxycycline  100 mg po BID x 10 days yesterday. She tolerated it ok, but did have some GI upset.  The following portions of the chart were reviewed this encounter and updated as appropriate: medications, allergies, medical history  Review of Systems:  No other skin or systemic complaints except as noted in HPI or Assessment and Plan.  Objective  Well appearing patient in no apparent distress; mood and affect are within normal limits.  A focused examination was performed of the following areas:  Axillary areas  Relevant exam findings are noted in the Assessment and Plan.    Assessment & Plan   HIGH RISK MEDICATIONS (NOT ANTICOAGULANTS) LONG-TERM USE   Related Procedures AST ALT QuantiFERON-TB Gold Plus Hepatitis C antibody Hepatitis B core antibody, total Hepatitis B surface antigen Hepatitis B surface antibody,qualitative HIDRADENITIS SUPPURATIVA   Related Medications doxycycline  (MONODOX ) 100 MG capsule Take 1 capsule (100 mg total) by mouth 2 (two) times daily. Take with food and drink  HIDRADENITIS SUPPURATIVA, [place] II, improved with doxycycline  but still flaring and recurrent, not at goal Exam: improved numerous inflammatory nodules and scarring in bilateral axillae  09/05/23 CBC normal, renal function normal 04/19/23 HIV negative  No exposure to TB per patient, not currently being treated for cancer, no chronic infection, no personal or family hx of UC or Crohn's disease.  Chronic and persistent condition with duration or expected duration over one year. Condition is symptomatic/ bothersome to patient. Not currently at goal.  Hidradenitis Suppurativa is a chronic; persistent; non-curable, but treatable condition due to abnormal inflamed sweat glands in the body folds  (axilla, inframammary, groin, medial thighs), causing recurrent painful draining cysts and scarring. It can be associated with severe scarring acne and cysts; also abscesses and scarring of scalp. The goal is control and prevention of flares, as it is not curable. Scars are permanent and can be thickened. Treatment may include daily use of topical medication and oral antibiotics.  Oral isotretinoin may also be helpful.  For some cases, Humira or Cosentyx (biologic injections) may be prescribed to decrease the inflammatory process and prevent flares.  When indicated, inflamed cysts may also be treated surgically.  Treatment Plan: Discussed and recommend Cosentyx. Pending lab results start Cosentyx 300 mg SQ on week 0,1,2,3, and 4. Then once monthly. Reviewed risks of biologics including immunosuppression, infections, injection site reaction, and failure to improve condition. Goal is control of skin condition, not cure.  Some older biologics such as Humira and Enbrel may slightly increase risk of malignancy and may worsen congestive heart failure.  Taltz and Cosentyx may cause inflammatory bowel disease to flare. The use of biologics requires long term medication management, including periodic office visits and monitoring of blood work.  Continue doxycycline  100 mg BID.   Long term medication management.  Patient is using long term (months to years) prescription medication  to control their dermatologic condition.  These medications require periodic monitoring to evaluate for efficacy and side effects and may require periodic laboratory monitoring.   Encounter for Removal of Sutures - Incision site at the R axilla is clean, dry and intact - Wound cleansed, and sutures removed. - Scars remodel for a full year. - Once steri-strips fall off, patient can apply over-the-counter silicone scar cream each night to  help with scar remodeling if desired. - Patient advised to call with any concerns or if they notice  any new or changing lesions.   Return in about 3 months (around 02/18/2024) for HS/Cosentyx follow up.  Arlinda Lais, CMA, am acting as scribe for Harris Liming, MD .   Documentation: I have reviewed the above documentation for accuracy and completeness, and I agree with the above.  Harris Liming, MD

## 2023-12-13 ENCOUNTER — Inpatient Hospital Stay
Admission: EM | Admit: 2023-12-13 | Discharge: 2023-12-20 | DRG: 207 | Disposition: A | Discharge status: Home Health | Attending: Hospitalist | Admitting: Hospitalist

## 2023-12-13 DIAGNOSIS — Z7982 Long term (current) use of aspirin: Secondary | ICD-10-CM

## 2023-12-13 DIAGNOSIS — J439 Emphysema, unspecified: Secondary | ICD-10-CM | POA: Diagnosis present

## 2023-12-13 DIAGNOSIS — J9621 Acute and chronic respiratory failure with hypoxia: Principal | ICD-10-CM | POA: Diagnosis present

## 2023-12-13 DIAGNOSIS — F419 Anxiety disorder, unspecified: Secondary | ICD-10-CM | POA: Diagnosis present

## 2023-12-13 DIAGNOSIS — R000 Tachycardia, unspecified: Secondary | ICD-10-CM | POA: Diagnosis present

## 2023-12-13 DIAGNOSIS — J189 Pneumonia, unspecified organism: Secondary | ICD-10-CM

## 2023-12-13 DIAGNOSIS — A419 Sepsis, unspecified organism: Principal | ICD-10-CM | POA: Diagnosis present

## 2023-12-13 DIAGNOSIS — Z8619 Personal history of other infectious and parasitic diseases: Secondary | ICD-10-CM

## 2023-12-13 DIAGNOSIS — W44F9XA Other object of natural or organic material, entering into or through a natural orifice, initial encounter: Secondary | ICD-10-CM | POA: Diagnosis present

## 2023-12-13 DIAGNOSIS — J1561 Pneumonia due to Acinetobacter baumannii: Secondary | ICD-10-CM | POA: Diagnosis present

## 2023-12-13 DIAGNOSIS — Z930 Tracheostomy status: Secondary | ICD-10-CM

## 2023-12-13 DIAGNOSIS — Z801 Family history of malignant neoplasm of trachea, bronchus and lung: Secondary | ICD-10-CM

## 2023-12-13 DIAGNOSIS — E7800 Pure hypercholesterolemia, unspecified: Secondary | ICD-10-CM | POA: Diagnosis present

## 2023-12-13 DIAGNOSIS — J440 Chronic obstructive pulmonary disease with acute lower respiratory infection: Secondary | ICD-10-CM | POA: Diagnosis present

## 2023-12-13 DIAGNOSIS — E669 Obesity, unspecified: Secondary | ICD-10-CM | POA: Diagnosis present

## 2023-12-13 DIAGNOSIS — I110 Hypertensive heart disease with heart failure: Secondary | ICD-10-CM | POA: Diagnosis present

## 2023-12-13 DIAGNOSIS — Z7985 Long-term (current) use of injectable non-insulin antidiabetic drugs: Secondary | ICD-10-CM

## 2023-12-13 DIAGNOSIS — Z79899 Other long term (current) drug therapy: Secondary | ICD-10-CM

## 2023-12-13 DIAGNOSIS — Z6835 Body mass index (BMI) 35.0-35.9, adult: Secondary | ICD-10-CM

## 2023-12-13 DIAGNOSIS — Z87891 Personal history of nicotine dependence: Secondary | ICD-10-CM

## 2023-12-13 DIAGNOSIS — N390 Urinary tract infection, site not specified: Secondary | ICD-10-CM | POA: Diagnosis present

## 2023-12-13 DIAGNOSIS — Z794 Long term (current) use of insulin: Secondary | ICD-10-CM

## 2023-12-13 DIAGNOSIS — F32A Depression, unspecified: Secondary | ICD-10-CM | POA: Diagnosis present

## 2023-12-13 DIAGNOSIS — Z880 Allergy status to penicillin: Secondary | ICD-10-CM

## 2023-12-13 DIAGNOSIS — E1165 Type 2 diabetes mellitus with hyperglycemia: Secondary | ICD-10-CM | POA: Diagnosis present

## 2023-12-13 DIAGNOSIS — T17990A Other foreign object in respiratory tract, part unspecified in causing asphyxiation, initial encounter: Secondary | ICD-10-CM | POA: Diagnosis present

## 2023-12-13 DIAGNOSIS — I252 Old myocardial infarction: Secondary | ICD-10-CM

## 2023-12-13 DIAGNOSIS — J9622 Acute and chronic respiratory failure with hypercapnia: Secondary | ICD-10-CM | POA: Diagnosis present

## 2023-12-13 DIAGNOSIS — Z823 Family history of stroke: Secondary | ICD-10-CM

## 2023-12-13 DIAGNOSIS — Z955 Presence of coronary angioplasty implant and graft: Secondary | ICD-10-CM

## 2023-12-13 DIAGNOSIS — Z1152 Encounter for screening for COVID-19: Secondary | ICD-10-CM

## 2023-12-13 DIAGNOSIS — I5032 Chronic diastolic (congestive) heart failure: Secondary | ICD-10-CM | POA: Diagnosis present

## 2023-12-13 DIAGNOSIS — Z8249 Family history of ischemic heart disease and other diseases of the circulatory system: Secondary | ICD-10-CM

## 2023-12-13 DIAGNOSIS — Z888 Allergy status to other drugs, medicaments and biological substances status: Secondary | ICD-10-CM

## 2023-12-13 DIAGNOSIS — I2510 Atherosclerotic heart disease of native coronary artery without angina pectoris: Secondary | ICD-10-CM | POA: Diagnosis present

## 2023-12-13 DIAGNOSIS — Z803 Family history of malignant neoplasm of breast: Secondary | ICD-10-CM

## 2023-12-13 NOTE — ED Triage Notes (Signed)
 Pt arrived via ems from home. Pt here for respiratory distress per trach collar. Pt on 6L chronically. Pt went to have trach suctioned and cleaned Wednesday at her clinic. Doctor's noticed her mucus was thick and looked like a possible infection. Pt was prescribed antibiotic. Antibiotics have not been finished yet. Pt also endorses pain during urination and is febrile upon assessment. Pt is also complaining of bilateral flank pain.

## 2023-12-14 ENCOUNTER — Emergency Department

## 2023-12-14 DIAGNOSIS — Z794 Long term (current) use of insulin: Secondary | ICD-10-CM

## 2023-12-14 DIAGNOSIS — J9622 Acute and chronic respiratory failure with hypercapnia: Secondary | ICD-10-CM | POA: Diagnosis present

## 2023-12-14 DIAGNOSIS — I5032 Chronic diastolic (congestive) heart failure: Secondary | ICD-10-CM | POA: Diagnosis present

## 2023-12-14 DIAGNOSIS — A419 Sepsis, unspecified organism: Secondary | ICD-10-CM | POA: Diagnosis present

## 2023-12-14 DIAGNOSIS — E7800 Pure hypercholesterolemia, unspecified: Secondary | ICD-10-CM | POA: Diagnosis present

## 2023-12-14 DIAGNOSIS — Z7985 Long-term (current) use of injectable non-insulin antidiabetic drugs: Secondary | ICD-10-CM | POA: Diagnosis not present

## 2023-12-14 DIAGNOSIS — N390 Urinary tract infection, site not specified: Secondary | ICD-10-CM | POA: Diagnosis present

## 2023-12-14 DIAGNOSIS — Z1152 Encounter for screening for COVID-19: Secondary | ICD-10-CM | POA: Diagnosis not present

## 2023-12-14 DIAGNOSIS — W44F9XA Other object of natural or organic material, entering into or through a natural orifice, initial encounter: Secondary | ICD-10-CM | POA: Diagnosis present

## 2023-12-14 DIAGNOSIS — F419 Anxiety disorder, unspecified: Secondary | ICD-10-CM | POA: Diagnosis present

## 2023-12-14 DIAGNOSIS — Z880 Allergy status to penicillin: Secondary | ICD-10-CM | POA: Diagnosis not present

## 2023-12-14 DIAGNOSIS — Z8249 Family history of ischemic heart disease and other diseases of the circulatory system: Secondary | ICD-10-CM | POA: Diagnosis not present

## 2023-12-14 DIAGNOSIS — Z87891 Personal history of nicotine dependence: Secondary | ICD-10-CM | POA: Diagnosis not present

## 2023-12-14 DIAGNOSIS — J1561 Pneumonia due to Acinetobacter baumannii: Secondary | ICD-10-CM | POA: Diagnosis present

## 2023-12-14 DIAGNOSIS — J9621 Acute and chronic respiratory failure with hypoxia: Secondary | ICD-10-CM

## 2023-12-14 DIAGNOSIS — Z7982 Long term (current) use of aspirin: Secondary | ICD-10-CM | POA: Diagnosis not present

## 2023-12-14 DIAGNOSIS — I2510 Atherosclerotic heart disease of native coronary artery without angina pectoris: Secondary | ICD-10-CM | POA: Diagnosis present

## 2023-12-14 DIAGNOSIS — J441 Chronic obstructive pulmonary disease with (acute) exacerbation: Secondary | ICD-10-CM

## 2023-12-14 DIAGNOSIS — Z930 Tracheostomy status: Secondary | ICD-10-CM | POA: Diagnosis not present

## 2023-12-14 DIAGNOSIS — E1165 Type 2 diabetes mellitus with hyperglycemia: Secondary | ICD-10-CM | POA: Diagnosis present

## 2023-12-14 DIAGNOSIS — I110 Hypertensive heart disease with heart failure: Secondary | ICD-10-CM | POA: Diagnosis present

## 2023-12-14 DIAGNOSIS — E119 Type 2 diabetes mellitus without complications: Secondary | ICD-10-CM

## 2023-12-14 DIAGNOSIS — I10 Essential (primary) hypertension: Secondary | ICD-10-CM

## 2023-12-14 DIAGNOSIS — F32A Depression, unspecified: Secondary | ICD-10-CM | POA: Diagnosis present

## 2023-12-14 DIAGNOSIS — T17990A Other foreign object in respiratory tract, part unspecified in causing asphyxiation, initial encounter: Secondary | ICD-10-CM | POA: Diagnosis present

## 2023-12-14 DIAGNOSIS — J440 Chronic obstructive pulmonary disease with acute lower respiratory infection: Secondary | ICD-10-CM | POA: Diagnosis present

## 2023-12-14 DIAGNOSIS — J439 Emphysema, unspecified: Secondary | ICD-10-CM | POA: Diagnosis present

## 2023-12-14 DIAGNOSIS — E669 Obesity, unspecified: Secondary | ICD-10-CM | POA: Diagnosis present

## 2023-12-14 LAB — CBC WITH DIFFERENTIAL/PLATELET
Abs Immature Granulocytes: 0.05 K/uL (ref 0.00–0.07)
Basophils Absolute: 0 K/uL (ref 0.0–0.1)
Basophils Relative: 0 %
Eosinophils Absolute: 0.3 K/uL (ref 0.0–0.5)
Eosinophils Relative: 2 %
HCT: 37.9 % (ref 36.0–46.0)
Hemoglobin: 11.8 g/dL — ABNORMAL LOW (ref 12.0–15.0)
Immature Granulocytes: 0 %
Lymphocytes Relative: 18 %
Lymphs Abs: 2 K/uL (ref 0.7–4.0)
MCH: 26.3 pg (ref 26.0–34.0)
MCHC: 31.1 g/dL (ref 30.0–36.0)
MCV: 84.4 fL (ref 80.0–100.0)
Monocytes Absolute: 0.8 K/uL (ref 0.1–1.0)
Monocytes Relative: 7 %
Neutro Abs: 8.2 K/uL — ABNORMAL HIGH (ref 1.7–7.7)
Neutrophils Relative %: 73 %
Platelets: 262 K/uL (ref 150–400)
RBC: 4.49 MIL/uL (ref 3.87–5.11)
RDW: 13.6 % (ref 11.5–15.5)
WBC: 11.4 K/uL — ABNORMAL HIGH (ref 4.0–10.5)
nRBC: 0 % (ref 0.0–0.2)

## 2023-12-14 LAB — RESPIRATORY PANEL BY PCR

## 2023-12-14 LAB — COMPREHENSIVE METABOLIC PANEL WITH GFR
ALT: 12 U/L (ref 0–44)
AST: 17 U/L (ref 15–41)
Albumin: 3.5 g/dL (ref 3.5–5.0)
Alkaline Phosphatase: 92 U/L (ref 38–126)
Anion gap: 11 (ref 5–15)
BUN: 14 mg/dL (ref 8–23)
CO2: 22 mmol/L (ref 22–32)
Calcium: 8.9 mg/dL (ref 8.9–10.3)
Chloride: 103 mmol/L (ref 98–111)
Creatinine, Ser: 0.81 mg/dL (ref 0.44–1.00)
GFR, Estimated: >60 mL/min (ref >60)
Glucose, Bld: 195 mg/dL — ABNORMAL HIGH (ref 70–99)
Potassium: 3.5 mmol/L (ref 3.5–5.1)
Sodium: 136 mmol/L (ref 135–145)
Total Bilirubin: 0.7 mg/dL (ref 0.0–1.2)
Total Protein: 7.9 g/dL (ref 6.5–8.1)

## 2023-12-14 LAB — GLUCOSE, CAPILLARY
Glucose-Capillary: 118 mg/dL — ABNORMAL HIGH (ref 70–99)
Glucose-Capillary: 211 mg/dL — ABNORMAL HIGH (ref 70–99)
Glucose-Capillary: 241 mg/dL — ABNORMAL HIGH (ref 70–99)
Glucose-Capillary: 264 mg/dL — ABNORMAL HIGH (ref 70–99)
Glucose-Capillary: 303 mg/dL — ABNORMAL HIGH (ref 70–99)

## 2023-12-14 LAB — LACTIC ACID, PLASMA: Lactic Acid, Venous: 1.1 mmol/L (ref 0.5–1.9)

## 2023-12-14 LAB — URINE DRUG SCREEN, QUALITATIVE (ARMC ONLY)
Amphetamines, Ur Screen: NOT DETECTED
Barbiturates, Ur Screen: NOT DETECTED
Benzodiazepine, Ur Scrn: NOT DETECTED
Cannabinoid 50 Ng, Ur ~~LOC~~: NOT DETECTED
Cocaine Metabolite,Ur ~~LOC~~: NOT DETECTED
MDMA (Ecstasy)Ur Screen: NOT DETECTED
Methadone Scn, Ur: NOT DETECTED
Opiate, Ur Screen: NOT DETECTED
Phencyclidine (PCP) Ur S: NOT DETECTED
Tricyclic, Ur Screen: NOT DETECTED

## 2023-12-14 LAB — URINALYSIS, W/ REFLEX TO CULTURE (INFECTION SUSPECTED)
Bilirubin Urine: NEGATIVE
Glucose, UA: 50 mg/dL — AB
Ketones, ur: NEGATIVE mg/dL
Nitrite: NEGATIVE
Protein, ur: 100 mg/dL — AB
Specific Gravity, Urine: 1.028 (ref 1.005–1.030)
WBC, UA: >50 WBC/hpf (ref 0–5)
pH: 5 (ref 5.0–8.0)

## 2023-12-14 LAB — MRSA NEXT GEN BY PCR, NASAL: MRSA by PCR Next Gen: NOT DETECTED

## 2023-12-14 LAB — PROTIME-INR
INR: 1 (ref 0.8–1.2)
Prothrombin Time: 13.3 s (ref 11.4–15.2)

## 2023-12-14 LAB — CBC
HCT: 35.9 % — ABNORMAL LOW (ref 36.0–46.0)
Hemoglobin: 10.8 g/dL — ABNORMAL LOW (ref 12.0–15.0)
MCH: 25.6 pg — ABNORMAL LOW (ref 26.0–34.0)
MCHC: 30.1 g/dL (ref 30.0–36.0)
MCV: 85.1 fL (ref 80.0–100.0)
Platelets: 234 K/uL (ref 150–400)
RBC: 4.22 MIL/uL (ref 3.87–5.11)
RDW: 13.4 % (ref 11.5–15.5)
WBC: 8.5 K/uL (ref 4.0–10.5)
nRBC: 0 % (ref 0.0–0.2)

## 2023-12-14 LAB — BASIC METABOLIC PANEL WITH GFR
Anion gap: 8 (ref 5–15)
BUN: 13 mg/dL (ref 8–23)
CO2: 25 mmol/L (ref 22–32)
Calcium: 8.4 mg/dL — ABNORMAL LOW (ref 8.9–10.3)
Chloride: 104 mmol/L (ref 98–111)
Creatinine, Ser: 0.75 mg/dL (ref 0.44–1.00)
GFR, Estimated: >60 mL/min (ref >60)
Glucose, Bld: 289 mg/dL — ABNORMAL HIGH (ref 70–99)
Potassium: 3.6 mmol/L (ref 3.5–5.1)
Sodium: 137 mmol/L (ref 135–145)

## 2023-12-14 LAB — PROCALCITONIN: Procalcitonin: 0.3 ng/mL

## 2023-12-14 LAB — RESP PANEL BY RT-PCR (RSV, FLU A&B, COVID)  RVPGX2
Influenza A by PCR: NEGATIVE
Influenza B by PCR: NEGATIVE
Resp Syncytial Virus by PCR: NEGATIVE
SARS Coronavirus 2 by RT PCR: NEGATIVE

## 2023-12-14 LAB — MAGNESIUM: Magnesium: 1.5 mg/dL — ABNORMAL LOW (ref 1.7–2.4)

## 2023-12-14 LAB — CBG MONITORING, ED: Glucose-Capillary: 220 mg/dL — ABNORMAL HIGH (ref 70–99)

## 2023-12-14 LAB — D-DIMER, QUANTITATIVE: D-Dimer, Quant: 0.46 ug{FEU}/mL (ref 0.00–0.50)

## 2023-12-14 LAB — STREP PNEUMONIAE URINARY ANTIGEN: Strep Pneumo Urinary Antigen: NEGATIVE

## 2023-12-14 MED ORDER — METRONIDAZOLE 500 MG/100ML IV SOLN
500.0000 mg | Freq: Once | INTRAVENOUS | Status: AC
  Administered 2023-12-14: 500 mg via INTRAVENOUS
  Filled 2023-12-14: qty 100

## 2023-12-14 MED ORDER — CLONAZEPAM 0.5 MG PO TABS
0.5000 mg | ORAL_TABLET | Freq: Three times a day (TID) | ORAL | Status: DC
  Administered 2023-12-14 – 2023-12-20 (×20): 0.5 mg via ORAL
  Filled 2023-12-14 (×22): qty 1

## 2023-12-14 MED ORDER — FAMOTIDINE 20 MG PO TABS
20.0000 mg | ORAL_TABLET | Freq: Two times a day (BID) | ORAL | Status: DC
  Administered 2023-12-14 (×2): 20 mg
  Filled 2023-12-14 (×2): qty 1

## 2023-12-14 MED ORDER — VANCOMYCIN HCL 2000 MG/400ML IV SOLN
2000.0000 mg | Freq: Once | INTRAVENOUS | Status: AC
  Administered 2023-12-14: 2000 mg via INTRAVENOUS
  Filled 2023-12-14: qty 400

## 2023-12-14 MED ORDER — LACTATED RINGERS IV BOLUS (SEPSIS)
1000.0000 mL | Freq: Once | INTRAVENOUS | Status: AC
Start: 2023-12-14 — End: 2023-12-14
  Administered 2023-12-14: 1000 mL via INTRAVENOUS

## 2023-12-14 MED ORDER — CHLORHEXIDINE GLUCONATE CLOTH 2 % EX PADS
6.0000 | MEDICATED_PAD | Freq: Every day | CUTANEOUS | Status: DC
  Administered 2023-12-14 – 2023-12-20 (×7): 6 via TOPICAL

## 2023-12-14 MED ORDER — FAMOTIDINE 20 MG PO TABS
20.0000 mg | ORAL_TABLET | Freq: Two times a day (BID) | ORAL | Status: DC
  Administered 2023-12-15 – 2023-12-20 (×11): 20 mg via ORAL
  Filled 2023-12-14 (×11): qty 1

## 2023-12-14 MED ORDER — PIPERACILLIN-TAZOBACTAM 3.375 G IVPB
3.3750 g | Freq: Three times a day (TID) | INTRAVENOUS | Status: DC
  Administered 2023-12-14 – 2023-12-18 (×13): 3.375 g via INTRAVENOUS
  Filled 2023-12-14 (×13): qty 50

## 2023-12-14 MED ORDER — VANCOMYCIN HCL IN DEXTROSE 1-5 GM/200ML-% IV SOLN: 1000.0000 mg | Freq: Once | INTRAVENOUS | Status: DC

## 2023-12-14 MED ORDER — POTASSIUM CHLORIDE CRYS ER 20 MEQ PO TBCR
40.0000 meq | EXTENDED_RELEASE_TABLET | Freq: Once | ORAL | Status: AC
  Administered 2023-12-14: 40 meq via ORAL
  Filled 2023-12-14: qty 2

## 2023-12-14 MED ORDER — MAGNESIUM SULFATE 2 GM/50ML IV SOLN
2.0000 g | Freq: Once | INTRAVENOUS | Status: AC
  Administered 2023-12-14: 2 g via INTRAVENOUS
  Filled 2023-12-14: qty 50

## 2023-12-14 MED ORDER — INSULIN ASPART 100 UNIT/ML IJ SOLN
0.0000 [IU] | INTRAMUSCULAR | Status: DC
  Administered 2023-12-14: 7 [IU] via SUBCUTANEOUS
  Administered 2023-12-14 (×2): 3 [IU] via SUBCUTANEOUS
  Administered 2023-12-14: 5 [IU] via SUBCUTANEOUS
  Administered 2023-12-14: 3 [IU] via SUBCUTANEOUS
  Administered 2023-12-15 (×3): 2 [IU] via SUBCUTANEOUS
  Administered 2023-12-15: 1 [IU] via SUBCUTANEOUS
  Administered 2023-12-16 (×3): 3 [IU] via SUBCUTANEOUS
  Administered 2023-12-16 (×3): 2 [IU] via SUBCUTANEOUS
  Administered 2023-12-17: 1 [IU] via SUBCUTANEOUS
  Administered 2023-12-17: 2 [IU] via SUBCUTANEOUS
  Administered 2023-12-17: 3 [IU] via SUBCUTANEOUS
  Administered 2023-12-17: 1 [IU] via SUBCUTANEOUS
  Administered 2023-12-17: 3 [IU] via SUBCUTANEOUS
  Administered 2023-12-17: 1 [IU] via SUBCUTANEOUS
  Administered 2023-12-18: 2 [IU] via SUBCUTANEOUS
  Administered 2023-12-18: 1 [IU] via SUBCUTANEOUS
  Administered 2023-12-18 (×2): 2 [IU] via SUBCUTANEOUS
  Administered 2023-12-18: 1 [IU] via SUBCUTANEOUS
  Administered 2023-12-18 – 2023-12-19 (×3): 2 [IU] via SUBCUTANEOUS
  Administered 2023-12-19: 5 [IU] via SUBCUTANEOUS
  Administered 2023-12-19: 2 [IU] via SUBCUTANEOUS
  Administered 2023-12-19: 3 [IU] via SUBCUTANEOUS
  Filled 2023-12-14 (×2): qty 2
  Filled 2023-12-14 (×2): qty 3
  Filled 2023-12-14: qty 1
  Filled 2023-12-14 (×3): qty 2
  Filled 2023-12-14: qty 1
  Filled 2023-12-14 (×3): qty 3
  Filled 2023-12-14: qty 1
  Filled 2023-12-14: qty 3
  Filled 2023-12-14: qty 1
  Filled 2023-12-14: qty 5
  Filled 2023-12-14: qty 1
  Filled 2023-12-14: qty 5
  Filled 2023-12-14: qty 3
  Filled 2023-12-14: qty 2
  Filled 2023-12-14: qty 5
  Filled 2023-12-14 (×5): qty 2
  Filled 2023-12-14 (×2): qty 1
  Filled 2023-12-14: qty 5
  Filled 2023-12-14: qty 1
  Filled 2023-12-14: qty 3
  Filled 2023-12-14: qty 5

## 2023-12-14 MED ORDER — PROPOFOL 1000 MG/100ML IV EMUL: 0.0000 ug/kg/min | INTRAVENOUS | Status: DC

## 2023-12-14 MED ORDER — IPRATROPIUM-ALBUTEROL 0.5-2.5 (3) MG/3ML IN SOLN
3.0000 mL | Freq: Four times a day (QID) | RESPIRATORY_TRACT | Status: DC
  Administered 2023-12-14 – 2023-12-20 (×26): 3 mL via RESPIRATORY_TRACT
  Filled 2023-12-14 (×26): qty 3

## 2023-12-14 MED ORDER — LACTATED RINGERS IV BOLUS (SEPSIS)
500.0000 mL | Freq: Once | INTRAVENOUS | Status: AC
  Administered 2023-12-14: 500 mL via INTRAVENOUS

## 2023-12-14 MED ORDER — CEFEPIME HCL 2 G IV SOLR
2.0000 g | Freq: Once | INTRAVENOUS | Status: AC
  Administered 2023-12-14: 2 g via INTRAVENOUS
  Filled 2023-12-14: qty 12.5

## 2023-12-14 MED ORDER — LABETALOL HCL 5 MG/ML IV SOLN
20.0000 mg | INTRAVENOUS | Status: DC | PRN
  Administered 2023-12-14 – 2023-12-20 (×3): 20 mg via INTRAVENOUS
  Filled 2023-12-14 (×3): qty 4

## 2023-12-14 MED ORDER — LACTATED RINGERS IV SOLN: INTRAVENOUS | Status: DC

## 2023-12-14 MED ORDER — HEPARIN SODIUM (PORCINE) 5000 UNIT/ML IJ SOLN
5000.0000 [IU] | Freq: Three times a day (TID) | INTRAMUSCULAR | Status: DC
  Administered 2023-12-14 – 2023-12-19 (×17): 5000 [IU] via SUBCUTANEOUS
  Filled 2023-12-14 (×17): qty 1

## 2023-12-14 MED ORDER — ONDANSETRON HCL 4 MG/2ML IJ SOLN
4.0000 mg | Freq: Once | INTRAMUSCULAR | Status: AC
  Administered 2023-12-14: 4 mg via INTRAVENOUS
  Filled 2023-12-14: qty 2

## 2023-12-14 MED ORDER — DOCUSATE SODIUM 100 MG PO CAPS
100.0000 mg | ORAL_CAPSULE | Freq: Two times a day (BID) | ORAL | Status: DC | PRN
Start: 2023-12-14 — End: 2023-12-20

## 2023-12-14 MED ORDER — IPRATROPIUM-ALBUTEROL 0.5-2.5 (3) MG/3ML IN SOLN
3.0000 mL | Freq: Four times a day (QID) | RESPIRATORY_TRACT | Status: DC | PRN
Start: 2023-12-14 — End: 2023-12-20
  Administered 2023-12-17 – 2023-12-18 (×2): 3 mL via RESPIRATORY_TRACT
  Filled 2023-12-14 (×2): qty 3

## 2023-12-14 MED ORDER — POTASSIUM CHLORIDE 10 MEQ/100ML IV SOLN
10.0000 meq | INTRAVENOUS | Status: DC
  Filled 2023-12-14 (×2): qty 100

## 2023-12-14 MED ORDER — ACETAMINOPHEN 500 MG PO TABS
1000.0000 mg | ORAL_TABLET | Freq: Once | ORAL | Status: AC
  Administered 2023-12-14: 1000 mg via ORAL
  Filled 2023-12-14: qty 2

## 2023-12-14 MED ORDER — MIDAZOLAM HCL 2 MG/2ML IJ SOLN
2.0000 mg | INTRAMUSCULAR | Status: DC | PRN
  Administered 2023-12-14 – 2023-12-20 (×25): 2 mg via INTRAVENOUS
  Filled 2023-12-14 (×25): qty 2

## 2023-12-14 MED ORDER — POLYETHYLENE GLYCOL 3350 17 G PO PACK: 17.0000 g | PACK | Freq: Every day | ORAL | Status: DC | PRN

## 2023-12-14 MED ORDER — FENTANYL CITRATE PF 50 MCG/ML IJ SOSY
50.0000 ug | PREFILLED_SYRINGE | Freq: Once | INTRAMUSCULAR | Status: AC
  Administered 2023-12-14: 50 ug via INTRAVENOUS
  Filled 2023-12-14: qty 1

## 2023-12-14 MED ORDER — OXYCODONE-ACETAMINOPHEN 5-325 MG PO TABS
1.0000 | ORAL_TABLET | Freq: Four times a day (QID) | ORAL | Status: DC | PRN
  Administered 2023-12-14 – 2023-12-20 (×15): 1 via ORAL
  Filled 2023-12-14 (×15): qty 1

## 2023-12-14 MED ORDER — IPRATROPIUM-ALBUTEROL 0.5-2.5 (3) MG/3ML IN SOLN
3.0000 mL | Freq: Once | RESPIRATORY_TRACT | Status: AC
  Administered 2023-12-14: 3 mL via RESPIRATORY_TRACT
  Filled 2023-12-14: qty 3

## 2023-12-14 NOTE — H&P (Signed)
 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  She was prescribed antibiotics which she reports she did not complete.   ED Course: Initial vital signs showed HR of 116 beats/minute, BP mm Hg, the RR 29 breaths/minute, and the oxygen saturation 98% on TC and a temperature of 100.81F (38.2C).  Pertinent Labs/Diagnostics Findings: Glucose:195 otherwise unremarkable CMP WBC:11.4 K/L  COVID PCR: Negative Chest x-ray shows possible infiltrate versus atelectasis in the right lower lobe. CT scans obtained which shows pneumonia in the right lower lung  Patient given 30 cc/kg of fluids and started on broad-spectrum antibiotics Vanco cefepime  and Flagyl  for suspected sepsis with septic shock.  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, HTN, HLD, CHF, CAD s/p STEMI s/ stent 2009, DM type 2 w/ insulin  dependence, Hepatitis C, Former tobacco and cocaine use, Anxiety/Depression, and Obesity   Significant Hospital Events   7/12:Admit to ICU on home ventilator with acute on chronic respiratory failure iso suspected Pneumonia, AECOPD and  UTI  Consults:  None  Procedures:  None  Interim History / Subjective:    -remains of trach collar  Micro Data:  7/12: SARS-CoV-2 PCR> negative 7/12: Influenza PCR> negative 7/12: Blood culture x2> 7/12: MRSA PCR>>  7/12: Strep pneumo urinary antigen> 7/12: Legionella urinary antigen>  Antimicrobials:  Vancomycin  7/12 Cefepime  7/12 Metronidazole  7/12  OBJECTIVE  Blood pressure 125/62, pulse 71, temperature 98.9 F (37.2 C), temperature source Oral, resp. rate 16, height 5' (1.524 m), weight 85 kg, last menstrual period 03/06/2005, SpO2 100%.    Vent Mode: SIMV Set Rate:  [16 bmp] 16 bmp Vt Set:  [400 mL] 400 mL PEEP:  [5 cmH20] 5 cmH20 Pressure Support:  [10 cmH20] 10 cmH20   Intake/Output Summary (Last 24 hours) at 12/14/2023 0514 Last data filed at 12/14/2023 0440 Gross per 24 hour  Intake 1200 ml  Output --  Net 1200 ml   Filed Weights   12/13/23 2328  Weight: 85 kg   Physical Examination  GENERAL: Normal appearance. She is obese.  HEENT:     Head: Normocephalic and atraumatic.     Right Ear: External ear normal.     Left Ear: External ear normal.     Nose: Nose normal.     Mouth/Throat:     Mouth: Mucous membranes are moist.     Extraocular Movements: Extraocular movements intact.     Pupils: Pupils are equal, round, and reactive to light.  CARDIOVASCULAR:S1 S2, no murmurs Intermittently tachycardic PULMONARY: Trach and Ventilator, Moderate secretions, Lungs course with wheezes ABDOMINAL: NTND +BS slightly distended.  MUSCULOSKELETAL: No swelling. Normal range of motion. 4/5 strength in all extremities.   SKIN:Skin is warm and dry. Capillary Refill: Capillary  refill takes less than 2 seconds.  NEUROLOGICAL: General: No focal deficit present. She is alert and oriented to person, place, and time baseline.  PSYCHIATRIC:  Mood and Affect: Mood normal.  Labs/imaging that I havepersonally reviewed  (right click and Reselect all SmartList Selections  daily)   CT CHEST ABDOMEN PELVIS WO CONTRAST Result Date: 12/14/2023 CLINICAL DATA:  Possible sepsis EXAM: CT CHEST, ABDOMEN AND PELVIS WITHOUT CONTRAST TECHNIQUE: Multidetector CT imaging of the chest, abdomen and pelvis was performed following the standard protocol without IV contrast. RADIATION DOSE REDUCTION: This exam was performed according to the departmental dose-optimization program which includes automated exposure control, adjustment of the mA and/or kV according to patient size and/or use of iterative reconstruction technique. COMPARISON:  09/04/2023, chest x-ray from earlier in the same day. FINDINGS: CT CHEST FINDINGS Cardiovascular: Limited due to lack of IV contrast. Atherosclerotic calcifications of the aorta are seen without aneurysmal dilatation. Coronary calcifications are noted. No significant cardiac enlargement is seen. Mediastinum/Nodes: Thoracic inlet shows a tracheostomy tube in satisfactory position. No hilar or mediastinal adenopathy is noted. The esophagus as visualized is within normal limits. Lungs/Pleura: Right basilar infiltrate is noted similar to that seen on the prior plain film examination. Chronic scarring in the left base is seen. Emphysematous changes are noted. No other focal infiltrate is noted. Musculoskeletal: No acute bony abnormality is noted. CT ABDOMEN PELVIS FINDINGS Hepatobiliary: No focal liver abnormality is seen. No gallstones, gallbladder wall thickening, or biliary dilatation. Pancreas: Unremarkable. No pancreatic ductal dilatation or surrounding inflammatory changes. Spleen: Normal in size without focal abnormality. Adrenals/Urinary Tract: Adrenal glands are within normal limits. Kidneys are well visualized bilaterally. No renal calculi or obstructive changes are seen. Simple cyst is noted in the midportion of the right kidney stable from prior exam. No follow-up is recommended. The bladder is well distended. Stomach/Bowel: No obstructive or inflammatory  changes of the colon are noted. The appendix is within normal limits. Small bowel and stomach are unremarkable. Vascular/Lymphatic: Aortic atherosclerosis. No enlarged abdominal or pelvic lymph nodes. Reproductive: Uterus and bilateral adnexa are unremarkable. Tubal ligation clips are seen bilaterally. Other: No abdominal wall hernia or abnormality. No abdominopelvic ascites. Musculoskeletal: No acute or significant osseous findings. IMPRESSION: Right basilar infiltrate similar to that seen on recent plain film examination. No other focal abnormality is noted. Electronically Signed   By: Oneil Devonshire M.D.   On: 12/14/2023 03:19   DG Chest Port 1 View Result Date: 12/14/2023 CLINICAL DATA:  Respiratory distress EXAM: PORTABLE CHEST 1 VIEW COMPARISON:  09/02/2023 FINDINGS: Cardiac shadow is within normal limits. Tracheostomy tube is noted in satisfactory position. Chronic blunting of left costophrenic angle is seen. New right basilar atelectasis is seen. IMPRESSION: New right basilar atelectasis. Electronically Signed   By: Oneil Devonshire M.D.   On: 12/14/2023 00:48    Labs   CBC: Recent Labs  Lab 12/13/23 2245  WBC 11.4*  NEUTROABS 8.2*  HGB 11.8*  HCT 37.9  MCV 84.4  PLT 262   Basic Metabolic Panel: Recent Labs  Lab 12/13/23 2245  NA 136  K 3.5  CL 103  CO2 22  GLUCOSE 195*  BUN 14  CREATININE 0.81  CALCIUM  8.9   GFR: Estimated Creatinine Clearance: 69.7 mL/min (by C-G formula based on SCr of 0.81 mg/dL). Recent Labs  Lab 12/13/23 2245  WBC 11.4*  LATICACIDVEN 1.1    Liver Function Tests: Recent Labs  Lab 12/13/23 2245  AST 17  ALT 12  ALKPHOS 92  BILITOT  0.7  PROT 7.9  ALBUMIN  3.5   No results for input(s): LIPASE, AMYLASE in the last 168 hours. No results for input(s): AMMONIA in the last 168 hours.  ABG    Component Value Date/Time   PHART 7.31 (L) 09/30/2021 0500   PCO2ART 46 09/30/2021 0500   PO2ART 155 (H) 09/30/2021 0500   HCO3 24.8 08/29/2023  0307   ACIDBASEDEF 1.5 08/29/2023 0307   O2SAT 97.5 08/29/2023 0307     Coagulation Profile: Recent Labs  Lab 12/13/23 2245  INR 1.0   Cardiac Enzymes: No results for input(s): CKTOTAL, CKMB, CKMBINDEX, TROPONINI in the last 168 hours.  HbA1C: Hemoglobin A1C  Date/Time Value Ref Range Status  06/29/2013 04:02 AM 6.0 4.2 - 6.3 % Final    Comment:    The American Diabetes Association recommends that a primary goal of therapy should be <7% and that physicians should reevaluate the treatment regimen in patients with HbA1c values consistently >8%.    Hgb A1c MFr Bld  Date/Time Value Ref Range Status  08/29/2023 01:01 AM 9.4 (H) 4.8 - 5.6 % Final    Comment:    (NOTE) Pre diabetes:          5.7%-6.4%  Diabetes:              >6.4%  Glycemic control for   <7.0% adults with diabetes   02/19/2023 04:33 AM 10.3 (H) 4.8 - 5.6 % Final    Comment:    (NOTE)         Prediabetes: 5.7 - 6.4         Diabetes: >6.4         Glycemic control for adults with diabetes: <7.0    CBG: Recent Labs  Lab 12/14/23 0436  GLUCAP 220*   Review of Systems:   Unable to be obtained secondary to the patient is on trach collar  Past Medical History  She,  has a past medical history of Allergy, Anxiety, Asthma, CHF (congestive heart failure) (HCC), Cocaine abuse (HCC), COPD (chronic obstructive pulmonary disease) (HCC), Coronary artery disease, Emphysema/COPD (HCC), Hepatitis C, High cholesterol, Hypertension, Pneumothorax, and Tobacco abuse.   Surgical History    Past Surgical History:  Procedure Laterality Date   CARDIAC CATHETERIZATION     CHEST TUBE INSERTION     IR GASTROSTOMY TUBE MOD SED  10/31/2017   TRACHEOSTOMY TUBE PLACEMENT N/A 10/17/2017   Procedure: TRACHEOSTOMY;  Surgeon: Milissa Hamming, MD;  Location: [facility] ORS;  Service: ENT;  Laterality: N/A;     Social History   reports that she quit smoking about 6 years ago. Her smoking use included cigarettes. She started  smoking about 47 years ago. She has a 4.1 pack-year smoking history. She has never used smokeless tobacco. She reports that she does not currently use drugs after having used the following drugs: Crack cocaine. She reports that she does not drink alcohol.   Family History   Her family history includes Asthma in her mother; Breast cancer (age of onset: 48) in her maternal aunt; CAD in her father; COPD in her sister; CVA in her father; Lung cancer in her mother; Stroke in her father.   Allergies Allergies  Allergen Reactions   Lorazepam      Makes anxiety worse  Other Reaction(s): Makes anxiety worse   Lisinopril  Cough   Amoxicillin      Patient has tolerated cephalosporins in the past   Home Medications  Prior to Admission medications   Medication Sig Start Date End  Date Taking? Authorizing Provider  acetaminophen  (TYLENOL ) 325 MG tablet Take 2 tablets (650 mg total) by mouth every 6 (six) hours as needed for mild pain (pain score 1-3) (or Fever >/= 101). 04/24/23   Dorinda Drue DASEN, MD  albuterol  (PROVENTIL ) (2.5 MG/3ML) 0.083% nebulizer solution Take 3 mLs (2.5 mg total) by nebulization every 6 (six) hours as needed for wheezing or shortness of breath. 03/27/23   Tamea Dedra CROME, MD  albuterol  (VENTOLIN  HFA) 108 [phone] Base) MCG/ACT inhaler Inhale 2 puffs into the lungs every 6 (six) hours as needed for wheezing or shortness of breath. 04/02/22   Parrett, Madelin RAMAN, NP  amLODipine  (NORVASC ) 5 MG tablet Take 5 mg by mouth daily. 08/27/21   [provider]  aspirin  81 MG chewable tablet Chew 81 mg by mouth daily.     [provider]  azelastine  (ASTELIN ) 0.1 % nasal spray SMARTSIG:1-2 Spray(s) Both Nares Every 12 Hours PRN 09/22/20   [provider]  Blood Glucose Monitoring Suppl ([employer] BLOOD GLUCOSE METER) w/Device KIT Use as directed to monitor blood glucose up to qid 02/22/23   Alexander, Natalie, DO  cetirizine  (ZYRTEC ) 10 MG tablet Take 10 mg by mouth daily. 03/21/22    [provider]  clonazePAM  (KLONOPIN ) 0.5 MG tablet Take 0.5 mg by mouth in the morning, at noon, and at bedtime.    [provider]  cloNIDine  (CATAPRES  - DOSED IN MG/24 HR) 0.1 mg/24hr patch Place 0.1 mg onto the skin once a week.    [provider]  docusate sodium  (COLACE) 100 MG capsule Take 2 capsules (200 mg total) by mouth daily as needed for mild constipation. 04/24/23   Dorinda Drue DASEN, MD  doxycycline  (MONODOX ) 100 MG capsule Take 1 capsule (100 mg total) by mouth 2 (two) times daily. Take with food and drink 11/18/23   Claudene Lehmann, MD  fluticasone  (FLONASE ) 50 MCG/ACT nasal spray Place 1 spray into both nostrils daily.    [provider]  guaiFENesin  (MUCINEX ) 600 MG 12 hr tablet Take 600 mg by mouth 2 (two) times daily.    [provider]  hydrOXYzine  (VISTARIL ) 25 MG capsule Take 25 mg by mouth every 6 (six) hours as needed. 10/01/22   [provider]  ipratropium-albuterol  (DUONEB) 0.5-2.5 (3) MG/3ML SOLN Take 3 mLs by nebulization every 6 (six) hours as needed (for shortness of breath). 06/17/23   Kasa, Kurian, MD  LANTUS  SOLOSTAR 100 UNIT/ML Solostar Pen Inject 40 Units into the skin at bedtime. 01/03/23   [provider]  linezolid  (ZYVOX ) 600 MG tablet Take 1 tablet (600 mg total) by mouth every 12 (twelve) hours. 09/05/23   Nelson, Dana G, NP  naloxone [facility]) nasal spray 4 mg/0.1 mL Place 1 spray into the nose once. 10/22/22   [provider]  NOVOLOG  FLEXPEN 100 UNIT/ML FlexPen Inject 5-10 Units into the skin 3 (three) times daily with meals. Sliding scale mealtime insulin . 03/01/21   [provider]  nystatin  ointment (MYCOSTATIN ) Apply 1 Application topically 3 (three) times daily. 01/04/23   [provider]  oxyCODONE -acetaminophen  (PERCOCET/ROXICET) 5-325 MG tablet Take 1 tablet by mouth 3 (three) times daily as needed for pain. 09/07/21   [provider]  polyethylene glycol  powder (GLYCOLAX /MIRALAX ) 17 GM/SCOOP powder Take by mouth.    [provider]  PULMICORT  0.5 MG/2ML nebulizer solution Take 0.5 mg by nebulization every 12 (twelve) hours. 02/13/21   [provider]  terbinafine (LAMISIL) 1 % cream Apply  1 application  topically 2 (two) times daily. To affected skin, for 1 week after resolution of rash. # 30 gm, 1 refill.    [provider]  TRULICITY 4.5 MG/0.5ML SOPN Inject 4.5 mg into the skin once a week. 09/28/21   [provider]  Vitamin D , Ergocalciferol , (DRISDOL ) 1.25 MG (50000 UNIT) CAPS capsule Take 50,000 Units by mouth every 7 (seven) days.    [provider]  fluconazole  (DIFLUCAN ) 150 MG tablet Take 1 tablet day 1 repeat in 1 week 11/15/20   Moye, Virginia , MD  Scheduled Meds:  famotidine   20 mg Per Tube BID   heparin   5,000 Units Subcutaneous Q8H   insulin  aspart  0-9 Units Subcutaneous Q4H   ipratropium-albuterol   3 mL Nebulization Q6H   Continuous Infusions:  lactated ringers      PRN Meds:.docusate sodium , ipratropium-albuterol , polyethylene glycol  Active Hospital Problem list   See systems below  Assessment & Plan:  #Acute on Chronic Hypoxic & Hypercapnic Respiratory failure in the setting of ... #Acute COPD Exacerbation  #Suspected Pneumonia #Mucus Plugging PMHx: COPD/Emphysema/Asthma with chronic tracheostomy and home vent. Pseudomonas and Acinetobacter PNA  -Vent support per home setting -titrate FiO2, PEEP to maintain O2 sat >90%  -Lung protective ventilation  -PRN Chest X-ray & ABG -PRN and scheduled bronchodilators, hypertonic saline -Start systemic steroid   #Sepsis due to Suspected UTI and Possible Pneumonia Initial interventions/workup included: 2 L of NS/LR & Cefepime , Vanc and Flagly Meets sepsis criteria HR 116, RR 29 temp 100.7 -F/u cultures, trend lactic/ PCT -Monitor WBC/ fever curve -Obtain MRSA nasal swab, RVP, legionella, strep urine Ag -IV antibiotics  Zosyn  -Gentle IVF hydration as needed -Pressors PRN for MAP goal >65 -Strict I/O's   #Anxiety/Depression -Prn Valium  -Resume outpatient Klonopin     #Type 2 Diabetes Mellitus, insulin  dependent -CBG's q4h; Target range of 140 to 180 -SSI -Follow ICU Hypo/Hyperglycemia protocol   #Hypertension Hx: Hyperlipidemia, CAD, CHF -Cardiac monitoring -MAP goal > 65, SBP < 160 -check UDS -Continue home medications once evaluated by SLP    Best practice:  Diet:  Oral Pain/Anxiety/Delirium protocol (if indicated): No VAP protocol (if indicated): Yes DVT prophylaxis: Subcutaneous Heparin  GI prophylaxis: H2B Glucose control:  SSI Yes Central venous access:  N/A Arterial line:  N/A Foley:  N/A Mobility:  bed rest  PT consulted: N/A Last date of multidisciplinary goals of care discussion [7/12] Code Status:  full code Disposition: ICU   = Goals of Care = Code Status Order: FULL  Primary Emergency Contact: Pignato,[name], Home Phone: [phone] Wishes to pursue full aggressive treatment and intervention options, including CPR and intubation, but goals of care will be addressed on going with family if that should become necessary.  Critical care time: 45 minutes        Almarie Nose DNP, CCRN, FNP-C, AGACNP-BC Acute Care & Family Nurse Practitioner [facility] PCCM on call pager [phone]

## 2023-12-14 NOTE — Plan of Care (Signed)
 Continuing with plan of care.

## 2023-12-14 NOTE — Sepsis Progress Note (Signed)
 Elink monitoring for the code sepsis protocol.

## 2023-12-14 NOTE — Progress Notes (Signed)
 Orders received from provider to discontinue droplet precautions.

## 2023-12-14 NOTE — Consult Note (Signed)
 PHARMACY CONSULT NOTE - ELECTROLYTES  Pharmacy Consult for Electrolyte Monitoring and Replacement   Recent Labs: Height: 4' 10 (147.3 cm) Weight: 76.3 kg (168 lb 3.4 oz) IBW/kg (Calculated) : 40.9 Estimated Creatinine Clearance: 63.4 mL/min (by C-G formula based on SCr of 0.75 mg/dL). Potassium (mmol/L)  Date Value  12/14/2023 3.6  07/11/2014 4.6   Magnesium  (mg/dL)  Date Value  92/87/7974 1.5 (L)  10/31/2013 2.6 (H)   Calcium  (mg/dL)  Date Value  92/87/7974 8.4 (L)   Calcium , Total (mg/dL)  Date Value  97/92/7983 8.9   Albumin  (g/dL)  Date Value  92/88/7974 3.5  07/09/2014 3.7   Phosphorus (mg/dL)  Date Value  95/96/7974 3.6  10/31/2013 4.6   Sodium (mmol/L)  Date Value  12/14/2023 137  07/11/2014 139    Assessment  Michelle [name] is a 63 y.o. female presenting with shortness of breath, fever, and increased secretions. PMH significant for COPD, CHF, hypertension, hyperlipidemia, chronic respiratory failure status post tracheostomy on ventilator chronically. Pharmacy has been consulted to monitor and replace electrolytes.  Diet: NPO MIVF: LR @ 150 mL/hr Pertinent medications: N/A  Goal of Therapy: Electrolytes WNL  Plan:  Mag 1.5: Mag sulfate 2g IV x 1 K 3.6: Kcl 10mEq IV x 2 Check BMP, Mg, Phos with AM labs  Thank you for allowing pharmacy to be a part of this patient's care.  Ajaya Crutchfield A Kaylem Gidney, PharmD Clinical Pharmacist 12/14/2023 7:28 AM

## 2023-12-14 NOTE — ED Notes (Signed)
 IV team at bedside

## 2023-12-14 NOTE — ED Provider Notes (Signed)
 [facility] Provider Note    Event Date/Time   First MD Initiated Contact with Patient 12/13/23 2322     (approximate)   History   No chief complaint on file.   HPI  Michelle [name] is a 63 y.o. female with history of COPD, CHF, hypertension, hyperlipidemia, chronic respiratory failure status post tracheostomy on ventilator chronically who presents to the emergency department with complaints of increased shortness of breath, fevers, increased secretions.  Also having dysuria.  No vomiting or diarrhea.  No abdominal pain.  States she was recently started on antibiotics for presumed tracheitis/pneumonia.   History provided by patient, EMS.    Past Medical History:  Diagnosis Date   Allergy    Anxiety    Asthma    CHF (congestive heart failure) (HCC)    Cocaine abuse (HCC)    COPD (chronic obstructive pulmonary disease) (HCC)    Coronary artery disease    Emphysema/COPD (HCC)    Hepatitis C    High cholesterol    Hypertension    Pneumothorax    Tobacco abuse     Past Surgical History:  Procedure Laterality Date   CARDIAC CATHETERIZATION     CHEST TUBE INSERTION     IR GASTROSTOMY TUBE MOD SED  10/31/2017   TRACHEOSTOMY TUBE PLACEMENT N/A 10/17/2017   Procedure: TRACHEOSTOMY;  Surgeon: Milissa Hamming, MD;  Location: [facility] ORS;  Service: ENT;  Laterality: N/A;    MEDICATIONS:  Prior to Admission medications   Medication Sig Start Date End Date Taking? Authorizing Provider  acetaminophen  (TYLENOL ) 325 MG tablet Take 2 tablets (650 mg total) by mouth every 6 (six) hours as needed for mild pain (pain score 1-3) (or Fever >/= 101). 04/24/23   Dorinda Drue DASEN, MD  albuterol  (PROVENTIL ) (2.5 MG/3ML) 0.083% nebulizer solution Take 3 mLs (2.5 mg total) by nebulization every 6 (six) hours as needed for wheezing or shortness of breath. 03/27/23   Tamea Dedra CROME, MD  albuterol  (VENTOLIN  HFA) [phone] Base) MCG/ACT inhaler Inhale 2 puffs into the lungs  every 6 (six) hours as needed for wheezing or shortness of breath. 04/02/22   Parrett, Madelin RAMAN, NP  amLODipine  (NORVASC ) 5 MG tablet Take 5 mg by mouth daily. 08/27/21   [provider]  aspirin  81 MG chewable tablet Chew 81 mg by mouth daily.     [provider]  azelastine  (ASTELIN ) 0.1 % nasal spray SMARTSIG:1-2 Spray(s) Both Nares Every 12 Hours PRN 09/22/20   [provider]  Blood Glucose Monitoring Suppl ([employer] BLOOD GLUCOSE METER) w/Device KIT Use as directed to monitor blood glucose up to qid 02/22/23   Alexander, Natalie, DO  cetirizine  (ZYRTEC ) 10 MG tablet Take 10 mg by mouth daily. 03/21/22   [provider]  clonazePAM  (KLONOPIN ) 0.5 MG tablet Take 0.5 mg by mouth in the morning, at noon, and at bedtime.    [provider]  cloNIDine  (CATAPRES  - DOSED IN MG/24 HR) 0.1 mg/24hr patch Place 0.1 mg onto the skin once a week.    [provider]  docusate sodium  (COLACE) 100 MG capsule Take 2 capsules (200 mg total) by mouth daily as needed for mild constipation. 04/24/23   Dorinda Drue DASEN, MD  doxycycline  (MONODOX ) 100 MG capsule Take 1 capsule (100 mg total) by mouth 2 (two) times daily. Take with food and drink 11/18/23   Claudene Lehmann, MD  fluticasone  (FLONASE ) 50 MCG/ACT nasal spray Place 1 spray into both nostrils daily.  [provider]  guaiFENesin  (MUCINEX ) 600 MG 12 hr tablet Take 600 mg by mouth 2 (two) times daily.    [provider]  hydrOXYzine  (VISTARIL ) 25 MG capsule Take 25 mg by mouth every 6 (six) hours as needed. 10/01/22   [provider]  ipratropium-albuterol  (DUONEB) 0.5-2.5 (3) MG/3ML SOLN Take 3 mLs by nebulization every 6 (six) hours as needed (for shortness of breath). 06/17/23   Kasa, Kurian, MD  LANTUS  SOLOSTAR 100 UNIT/ML Solostar Pen Inject 40 Units into the skin at bedtime. 01/03/23   [provider]  linezolid  (ZYVOX ) 600 MG tablet Take 1 tablet (600 mg total) by mouth every  12 (twelve) hours. 09/05/23   Nelson, Dana G, NP  naloxone [facility]) nasal spray 4 mg/0.1 mL Place 1 spray into the nose once. 10/22/22   [provider]  NOVOLOG  FLEXPEN 100 UNIT/ML FlexPen Inject 5-10 Units into the skin 3 (three) times daily with meals. Sliding scale mealtime insulin . 03/01/21   [provider]  nystatin  ointment (MYCOSTATIN ) Apply 1 Application topically 3 (three) times daily. 01/04/23   [provider]  oxyCODONE -acetaminophen  (PERCOCET/ROXICET) 5-325 MG tablet Take 1 tablet by mouth 3 (three) times daily as needed for pain. 09/07/21   [provider]  polyethylene glycol powder (GLYCOLAX /MIRALAX ) 17 GM/SCOOP powder Take by mouth.    [provider]  PULMICORT  0.5 MG/2ML nebulizer solution Take 0.5 mg by nebulization every 12 (twelve) hours. 02/13/21   [provider]  terbinafine (LAMISIL) 1 % cream Apply 1 application  topically 2 (two) times daily. To affected skin, for 1 week after resolution of rash. # 30 gm, 1 refill.    [provider]  TRULICITY 4.5 MG/0.5ML SOPN Inject 4.5 mg into the skin once a week. 09/28/21   [provider]  Vitamin D , Ergocalciferol , (DRISDOL ) 1.25 MG (50000 UNIT) CAPS capsule Take 50,000 Units by mouth every 7 (seven) days.    [provider]  fluconazole  (DIFLUCAN ) 150 MG tablet Take 1 tablet day 1 repeat in 1 week 11/15/20   Moye, Virginia , MD    Physical Exam   Triage Vital Signs: ED Triage Vitals  Encounter Vitals Group     BP 12/13/23 2333 135/71     Girls Systolic BP Percentile --      Girls Diastolic BP Percentile --      Boys Systolic BP Percentile --      Boys Diastolic BP Percentile --      Pulse Rate 12/13/23 2327 (!) 116     Resp 12/13/23 2327 (!) 29     Temp 12/13/23 2327 (!) 100.7 F (38.2 C)     Temp Source 12/13/23 2327 Oral     SpO2 12/13/23 2326 96 %     Weight 12/13/23 2328 187 lb 8 oz (85 kg)     Height 12/13/23 2328 5' (1.524 m)     Head  Circumference --      Peak Flow --      Pain Score 12/13/23 2327 9     Pain Loc --      Pain Education --      Exclude from Growth Chart --     Most recent vital signs: Vitals:   12/13/23 2333 12/14/23 0437  BP: 135/71 125/62  Pulse:  71  Resp: (!) 34 16  Temp:  98.9 F (37.2 C)  SpO2:  100%    CONSTITUTIONAL: Alert, responds appropriately to questions.  Chronically ill-appearing HEAD: Normocephalic, atraumatic EYES:  Conjunctivae clear, pupils appear equal, sclera nonicteric ENT: normal nose; moist mucous membranes NECK: Supple, normal ROM, trach with increased secretions CARD: Regular and tachycardic; S1 and S2 appreciated RESP: Patient is tachypneic.  Breath sounds clear and equal bilaterally; no wheezes, no rhonchi, no rales, no hypoxia or respiratory distress, speaking full sentences ABD/GI: Non-distended; soft, non-tender, no rebound, no guarding, no peritoneal signs BACK: The back appears normal EXT: Normal ROM in all joints; no deformity noted, no edema SKIN: Normal color for age and race; warm; no rash on exposed skin NEURO: Moves all extremities equally, normal speech PSYCH: The patient's mood and manner are appropriate.   ED Results / Procedures / Treatments   LABS: (all labs ordered are listed, but only abnormal results are displayed) Labs Reviewed  CBC WITH DIFFERENTIAL/PLATELET - Abnormal; Notable for the following components:      Result Value   WBC 11.4 (*)    Hemoglobin 11.8 (*)    Neutro Abs 8.2 (*)    All other components within normal limits  COMPREHENSIVE METABOLIC PANEL WITH GFR - Abnormal; Notable for the following components:   Glucose, Bld 195 (*)    All other components within normal limits  CBG MONITORING, ED - Abnormal; Notable for the following components:   Glucose-Capillary 220 (*)    All other components within normal limits  RESP PANEL BY RT-PCR (RSV, FLU A&B, COVID)  RVPGX2  CULTURE, BLOOD (ROUTINE X 2)  CULTURE, BLOOD (ROUTINE X  2)  MRSA NEXT GEN BY PCR, NASAL  LACTIC ACID, PLASMA  PROTIME-INR  D-DIMER, QUANTITATIVE  URINALYSIS, W/ REFLEX TO CULTURE (INFECTION SUSPECTED)     EKG:  EKG Interpretation Date/Time:  Friday December 13 2023 23:30:01 EDT Ventricular Rate:  110 PR Interval:  145 QRS Duration:  127 QT Interval:  365 QTC Calculation: 494 R Axis:   91  Text Interpretation: Sinus tachycardia RBBB and LPFB Confirmed by Neomi Neptune [phone]) on 12/14/2023 12:33:45 AM         RADIOLOGY: My personal review and interpretation of imaging: Right lower lobe pneumonia.  I have personally reviewed all radiology reports.   CT CHEST ABDOMEN PELVIS WO CONTRAST Result Date: 12/14/2023 CLINICAL DATA:  Possible sepsis EXAM: CT CHEST, ABDOMEN AND PELVIS WITHOUT CONTRAST TECHNIQUE: Multidetector CT imaging of the chest, abdomen and pelvis was performed following the standard protocol without IV contrast. RADIATION DOSE REDUCTION: This exam was performed according to the departmental dose-optimization program which includes automated exposure control, adjustment of the mA and/or kV according to patient size and/or use of iterative reconstruction technique. COMPARISON:  09/04/2023, chest x-ray from earlier in the same day. FINDINGS: CT CHEST FINDINGS Cardiovascular: Limited due to lack of IV contrast. Atherosclerotic calcifications of the aorta are seen without aneurysmal dilatation. Coronary calcifications are noted. No significant cardiac enlargement is seen. Mediastinum/Nodes: Thoracic inlet shows a tracheostomy tube in satisfactory position. No hilar or mediastinal adenopathy is noted. The esophagus as visualized is within normal limits. Lungs/Pleura: Right basilar infiltrate is noted similar to that seen on the prior plain film examination. Chronic scarring in the left base is seen. Emphysematous changes are noted. No other focal infiltrate is noted. Musculoskeletal: No acute bony abnormality is noted. CT ABDOMEN PELVIS  FINDINGS Hepatobiliary: No focal liver abnormality is seen. No gallstones, gallbladder wall thickening, or biliary dilatation. Pancreas: Unremarkable. No pancreatic ductal dilatation or surrounding inflammatory changes. Spleen: Normal in size without focal abnormality. Adrenals/Urinary Tract: Adrenal glands are within normal limits. Kidneys are well visualized bilaterally.  No renal calculi or obstructive changes are seen. Simple cyst is noted in the midportion of the right kidney stable from prior exam. No follow-up is recommended. The bladder is well distended. Stomach/Bowel: No obstructive or inflammatory changes of the colon are noted. The appendix is within normal limits. Small bowel and stomach are unremarkable. Vascular/Lymphatic: Aortic atherosclerosis. No enlarged abdominal or pelvic lymph nodes. Reproductive: Uterus and bilateral adnexa are unremarkable. Tubal ligation clips are seen bilaterally. Other: No abdominal wall hernia or abnormality. No abdominopelvic ascites. Musculoskeletal: No acute or significant osseous findings. IMPRESSION: Right basilar infiltrate similar to that seen on recent plain film examination. No other focal abnormality is noted. Electronically Signed   By: Oneil Devonshire M.D.   On: 12/14/2023 03:19   DG Chest Port 1 View Result Date: 12/14/2023 CLINICAL DATA:  Respiratory distress EXAM: PORTABLE CHEST 1 VIEW COMPARISON:  09/02/2023 FINDINGS: Cardiac shadow is within normal limits. Tracheostomy tube is noted in satisfactory position. Chronic blunting of left costophrenic angle is seen. New right basilar atelectasis is seen. IMPRESSION: New right basilar atelectasis. Electronically Signed   By: Oneil Devonshire M.D.   On: 12/14/2023 00:48     PROCEDURES:  Critical Care performed: Yes, see critical care procedure note(s)   CRITICAL CARE Performed by: Josette Sheli Dorin   Total critical care time: 30 minutes  Critical care time was exclusive of separately billable procedures and  treating other patients.  Critical care was necessary to treat or prevent imminent or life-threatening deterioration.  Critical care was time spent personally by me on the following activities: development of treatment plan with patient and/or surrogate as well as nursing, discussions with consultants, evaluation of patient's response to treatment, examination of patient, obtaining history from patient or surrogate, ordering and performing treatments and interventions, ordering and review of laboratory studies, ordering and review of radiographic studies, pulse oximetry and re-evaluation of patient's condition.   SABRA1-3 Lead EKG Interpretation  Performed by: Areatha Kalata, Josette SAILOR, DO Authorized by: Verble Styron, Josette SAILOR, DO     Interpretation: abnormal     ECG rate:  116   ECG rate assessment: tachycardic     Rhythm: sinus tachycardia     Ectopy: none     Conduction: normal       IMPRESSION / MDM / ASSESSMENT AND PLAN / ED COURSE  I reviewed the triage vital signs and the nursing notes.    Patient here with chest pain, shortness of breath, dysuria.  Febrile, tachycardic, tachypneic.  The patient is on the cardiac monitor to evaluate for evidence of arrhythmia and/or significant heart rate changes.   DIFFERENTIAL DIAGNOSIS (includes but not limited to):   Tracheitis, pneumonia, UTI, sepsis, ACS, PE, pneumothorax, COPD exacerbation   Patient's presentation is most consistent with acute presentation with potential threat to life or bodily function.   PLAN: Will obtain labs, cultures, urine, chest x-ray.  Will give IV fluids, broad-spectrum antibiotics, Tylenol .  Lungs clear to auscultation with slightly diminished aeration.  No increased oxygen requirement.   MEDICATIONS GIVEN IN ED: Medications  lactated ringers  infusion (has no administration in time range)  vancomycin  (VANCOREADY) IVPB 2000 mg/400 mL (2,000 mg Intravenous New Bag/Given 12/14/23 0306)  docusate sodium  (COLACE) capsule 100  mg (has no administration in time range)  polyethylene glycol (MIRALAX  / GLYCOLAX ) packet 17 g (has no administration in time range)  heparin  injection 5,000 Units (has no administration in time range)  famotidine  (PEPCID ) tablet 20 mg (20 mg Per Tube Given 12/14/23 0442)  ipratropium-albuterol  (DUONEB) 0.5-2.5 (3) MG/3ML nebulizer solution 3 mL (has no administration in time range)  ipratropium-albuterol  (DUONEB) 0.5-2.5 (3) MG/3ML nebulizer solution 3 mL (has no administration in time range)  insulin  aspart (novoLOG ) injection 0-9 Units (3 Units Subcutaneous Given 12/14/23 0447)  lactated ringers  bolus 1,000 mL (0 mLs Intravenous Stopped 12/14/23 0440)    And  lactated ringers  bolus 500 mL (500 mLs Intravenous New Bag/Given 12/14/23 0441)  ceFEPIme  (MAXIPIME ) 2 g in sodium chloride  0.9 % 100 mL IVPB (0 g Intravenous Stopped 12/14/23 0128)  metroNIDAZOLE  (FLAGYL ) IVPB 500 mg (0 mg Intravenous Stopped 12/14/23 0305)  acetaminophen  (TYLENOL ) tablet 1,000 mg (1,000 mg Oral Given 12/14/23 0034)  fentaNYL  (SUBLIMAZE ) injection 50 mcg (50 mcg Intravenous Given 12/14/23 0259)  ondansetron  (ZOFRAN ) injection 4 mg (4 mg Intravenous Given 12/14/23 0258)  ipratropium-albuterol  (DUONEB) 0.5-2.5 (3) MG/3ML nebulizer solution 3 mL (3 mLs Nebulization Given 12/14/23 0232)     ED COURSE: Labs show mild leukocytosis.  Lactic of 1.1.  D-dimer negative.  Chest x-ray shows possible infiltrate versus atelectasis in the right lower lobe.  CT scans obtained which shows pneumonia in the right lower lung.  COVID, flu and RSV negative.  Urine pending.  Will discuss with ICU for admission given patient is on ventilator chronically.   CONSULTS: Discussed with Almarie Nose NP with ICU for admission.   OUTSIDE RECORDS REVIEWED: Reviewed patient's last admission.       FINAL CLINICAL IMPRESSION(S) / ED DIAGNOSES   Final diagnoses:  Acute sepsis (HCC)  Pneumonia of right lower lobe due to infectious organism     Rx  / DC Orders   ED Discharge Orders     None        Note:  This document was prepared using Dragon voice recognition software and may include unintentional dictation errors.   Conni Knighton, Josette SAILOR, DO 12/14/23 [phone]

## 2023-12-14 NOTE — Progress Notes (Signed)
 At approximately 1600 patient had copious amounts of secretions that were suctioned from the trachea after being repositioned in bed; B/P was checked once patient was settled with a reading of 243/120.  Patient requested pain medication for 8/10 pain to the lower back and anxiety medication which was administered and B/P reading 241/107 on repeat as well.  Provider notified of elevated B/P and ordered versed  which was administered to the patient with minimal relief.  Patient was noted to have continued restlessness, HR 124 B/P 207/104 (130), attempted to obtain EKG however, due to restlessness leads would not accurately pick up.  Provider was notified and ordered labetolol which was administered and ordered propofol  if needed as well.  EKG successfully obtained and repeat B/P 127/76 (90) and HR 122 and patient voiced relief of anxiety.  Next B/P reading 120/73 (88) and HR 94 and patient voiced feeling settled at this time.  Will continue to monitor.

## 2023-12-14 NOTE — Progress Notes (Signed)
 CODE SEPSIS - PHARMACY COMMUNICATION  **Broad Spectrum Antibiotics should be administered within 1 hour of Sepsis diagnosis**  Time Code Sepsis Called/Page Received: 0013  Antibiotics Ordered: Cefepime , Flagyl , Vancomycin   Time of 1st antibiotic administration: 0039  Rankin CANDIE Dills, PharmD, [facility] 12/14/2023 12:27 AM

## 2023-12-14 NOTE — Progress Notes (Signed)
 Order received from provider for percocet 5/325 Q6H prn moderate to severe pain.

## 2023-12-14 NOTE — Progress Notes (Signed)
 Order received from provider to discontinue order for LR at 136mL/hr, Klonopin  0.5mg  po TID, and regular diet.

## 2023-12-15 DIAGNOSIS — J9621 Acute and chronic respiratory failure with hypoxia: Secondary | ICD-10-CM | POA: Diagnosis not present

## 2023-12-15 DIAGNOSIS — J9622 Acute and chronic respiratory failure with hypercapnia: Secondary | ICD-10-CM | POA: Diagnosis not present

## 2023-12-15 DIAGNOSIS — N390 Urinary tract infection, site not specified: Secondary | ICD-10-CM | POA: Diagnosis not present

## 2023-12-15 DIAGNOSIS — A419 Sepsis, unspecified organism: Secondary | ICD-10-CM | POA: Diagnosis not present

## 2023-12-15 LAB — GLUCOSE, CAPILLARY
Glucose-Capillary: 144 mg/dL — ABNORMAL HIGH (ref 70–99)
Glucose-Capillary: 117 mg/dL — ABNORMAL HIGH (ref 70–99)
Glucose-Capillary: 196 mg/dL — ABNORMAL HIGH (ref 70–99)
Glucose-Capillary: 182 mg/dL — ABNORMAL HIGH (ref 70–99)
Glucose-Capillary: 156 mg/dL — ABNORMAL HIGH (ref 70–99)
Glucose-Capillary: 177 mg/dL — ABNORMAL HIGH (ref 70–99)

## 2023-12-15 LAB — BASIC METABOLIC PANEL WITH GFR
Anion gap: 8 (ref 5–15)
BUN: 12 mg/dL (ref 8–23)
CO2: 24 mmol/L (ref 22–32)
Calcium: 8.5 mg/dL — ABNORMAL LOW (ref 8.9–10.3)
Chloride: 108 mmol/L (ref 98–111)
Creatinine, Ser: 0.72 mg/dL (ref 0.44–1.00)
GFR, Estimated: >60 mL/min (ref >60)
Glucose, Bld: 115 mg/dL — ABNORMAL HIGH (ref 70–99)
Potassium: 3.9 mmol/L (ref 3.5–5.1)
Sodium: 140 mmol/L (ref 135–145)

## 2023-12-15 LAB — CBC
HCT: 32.6 % — ABNORMAL LOW (ref 36.0–46.0)
Hemoglobin: 10.2 g/dL — ABNORMAL LOW (ref 12.0–15.0)
MCH: 25.8 pg — ABNORMAL LOW (ref 26.0–34.0)
MCHC: 31.3 g/dL (ref 30.0–36.0)
MCV: 82.5 fL (ref 80.0–100.0)
Platelets: 247 K/uL (ref 150–400)
RBC: 3.95 MIL/uL (ref 3.87–5.11)
RDW: 13.5 % (ref 11.5–15.5)
WBC: 7.5 K/uL (ref 4.0–10.5)
nRBC: 0 % (ref 0.0–0.2)

## 2023-12-15 LAB — TRIGLYCERIDES: Triglycerides: 118 mg/dL (ref <150)

## 2023-12-15 LAB — MAGNESIUM: Magnesium: 1.9 mg/dL (ref 1.7–2.4)

## 2023-12-15 MED ORDER — MAGNESIUM SULFATE 2 GM/50ML IV SOLN
2.0000 g | Freq: Once | INTRAVENOUS | Status: AC
  Administered 2023-12-15: 2 g via INTRAVENOUS
  Filled 2023-12-15: qty 50

## 2023-12-15 NOTE — Plan of Care (Signed)
 Continuing with plan of care.

## 2023-12-15 NOTE — Progress Notes (Signed)
 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  She was prescribed antibiotics which she reports she did not complete.   ED Course: Initial vital signs showed HR of 116 beats/minute, BP mm Hg, the RR 29 breaths/minute, and the oxygen saturation 98% on TC and a temperature of 100.70F (38.2C).  Pertinent Labs/Diagnostics Findings: Glucose:195 otherwise unremarkable CMP WBC:11.4 K/L  COVID PCR: Negative Chest x-ray shows possible infiltrate versus atelectasis in the right lower lobe. CT scans obtained which shows pneumonia in the right lower lung  Patient given 30 cc/kg of fluids and started on broad-spectrum antibiotics Vanco cefepime  and Flagyl  for suspected sepsis with septic shock.  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, HTN, HLD, CHF, CAD s/p STEMI s/ stent 2009, DM type 2 w/ insulin  dependence, Hepatitis C, Former tobacco and cocaine use, Anxiety/Depression, and Obesity   Significant Hospital Events   7/12:Admit to ICU on home ventilator with acute on chronic respiratory failure iso suspected Pneumonia, AECOPD and  UTI 7/13: Remain on home vent and tolerating  Consults:  None  Procedures:  None  Interim History / Subjective:      Micro Data:  7/12: SARS-CoV-2 PCR> negative 7/12: Influenza PCR> negative 7/12: Blood culture x2>no growth 7/12: MRSA PCR>> neg 7/12: Strep pneumo urinary antigen>neg 7/12: Legionella urinary antigen>  Antimicrobials:  Vancomycin  7/12x1 Cefepime  7/12 x1 Metronidazole  7/12 x1 Zosyn  7/13>>  OBJECTIVE  Blood pressure (!) 98/57, pulse 65, temperature 99 F (37.2 C), temperature source Oral, resp. rate 16, height 4' 10 (1.473 m), weight 73 kg, last menstrual period 03/06/2005, SpO2 99%.    Vent Mode: SIMV;PSV FiO2 (%):  [24 %] 24 % Set Rate:  [16 bmp] 16 bmp Vt Set:  [400 mL] 400 mL PEEP:  [5 cmH20] 5 cmH20 Pressure Support:  [10 cmH20] 10 cmH20   Intake/Output Summary (Last 24 hours) at 12/15/2023 0445 Last data filed at 12/15/2023 0400 Gross per 24 hour  Intake 1332.18 ml  Output 1550 ml  Net -217.82 ml   Filed Weights   12/13/23 2328 12/14/23 0647 12/15/23 0400  Weight: 85 kg 76.3 kg 73 kg   Physical Examination  GENERAL: Normal appearance. She is obese.  HEENT:     Head: Normocephalic and atraumatic.     Right Ear: External ear normal.     Left Ear: External ear normal.     Nose: Nose normal.     Mouth/Throat:     Mouth: Mucous membranes are moist.     Extraocular Movements: Extraocular movements intact.     Pupils: Pupils are equal, round, and reactive to light.  CARDIOVASCULAR:S1 S2, no murmurs Intermittently tachycardic PULMONARY: Trach and Ventilator, Moderate secretions, Lungs course with wheezes ABDOMINAL:  NTND +BS slightly distended.  MUSCULOSKELETAL: No swelling. Normal range of motion. 4/5 strength in all extremities.   SKIN:Skin is warm and dry. Capillary Refill: Capillary refill takes less than 2 seconds.  NEUROLOGICAL: General: No focal deficit present. She is alert and oriented to person, place, and time baseline.   PSYCHIATRIC:  Mood and Affect: Mood normal.  Labs/imaging that I havepersonally reviewed  (right click and Reselect all SmartList Selections daily)   CT CHEST ABDOMEN PELVIS WO CONTRAST Result Date: 12/14/2023 CLINICAL DATA:  Possible sepsis EXAM: CT CHEST, ABDOMEN AND PELVIS WITHOUT CONTRAST TECHNIQUE: Multidetector CT imaging of the chest, abdomen and pelvis was performed following the standard protocol without IV contrast. RADIATION DOSE REDUCTION: This exam was performed according to the departmental dose-optimization program which includes automated exposure control, adjustment of the mA and/or kV according to patient size and/or use of iterative reconstruction technique. COMPARISON:  09/04/2023, chest x-ray from earlier in the same day. FINDINGS: CT CHEST FINDINGS Cardiovascular: Limited due to lack of IV contrast. Atherosclerotic calcifications of the aorta are seen without aneurysmal dilatation. Coronary calcifications are noted. No significant cardiac enlargement is seen. Mediastinum/Nodes: Thoracic inlet shows a tracheostomy tube in satisfactory position. No hilar or mediastinal adenopathy is noted. The esophagus as visualized is within normal limits. Lungs/Pleura: Right basilar infiltrate is noted similar to that seen on the prior plain film examination. Chronic scarring in the left base is seen. Emphysematous changes are noted. No other focal infiltrate is noted. Musculoskeletal: No acute bony abnormality is noted. CT ABDOMEN PELVIS FINDINGS Hepatobiliary: No focal liver abnormality is seen. No gallstones, gallbladder wall thickening, or biliary dilatation. Pancreas: Unremarkable. No pancreatic ductal dilatation or surrounding inflammatory changes. Spleen: Normal in size without focal abnormality. Adrenals/Urinary Tract: Adrenal glands are within normal limits. Kidneys are well visualized bilaterally. No renal calculi or obstructive changes are seen. Simple cyst is noted in the midportion of the  right kidney stable from prior exam. No follow-up is recommended. The bladder is well distended. Stomach/Bowel: No obstructive or inflammatory changes of the colon are noted. The appendix is within normal limits. Small bowel and stomach are unremarkable. Vascular/Lymphatic: Aortic atherosclerosis. No enlarged abdominal or pelvic lymph nodes. Reproductive: Uterus and bilateral adnexa are unremarkable. Tubal ligation clips are seen bilaterally. Other: No abdominal wall hernia or abnormality. No abdominopelvic ascites. Musculoskeletal: No acute or significant osseous findings. IMPRESSION: Right basilar infiltrate similar to that seen on recent plain film examination. No other focal abnormality is noted. Electronically Signed   By: Oneil Devonshire M.D.   On: 12/14/2023 03:19   DG Chest Port 1 View Result Date: 12/14/2023 CLINICAL DATA:  Respiratory distress EXAM: PORTABLE CHEST 1 VIEW COMPARISON:  09/02/2023 FINDINGS: Cardiac shadow is within normal limits. Tracheostomy tube is noted in satisfactory position. Chronic blunting of left costophrenic angle is seen. New right basilar atelectasis is seen. IMPRESSION: New right basilar atelectasis. Electronically Signed   By: Oneil Devonshire M.D.   On: 12/14/2023 00:48    Labs   CBC: Recent Labs  Lab 12/13/23 2245 12/14/23 0654  WBC 11.4* 8.5  NEUTROABS 8.2*  --   HGB 11.8* 10.8*  HCT 37.9 35.9*  MCV 84.4 85.1  PLT 262 234   Basic Metabolic Panel: Recent Labs  Lab 12/13/23 2245 12/14/23 0654  NA 136 137  K 3.5 3.6  CL 103 104  CO2 22 25  GLUCOSE 195* 289*  BUN 14 13  CREATININE 0.81 0.75  CALCIUM  8.9 8.4*  MG  --  1.5*   GFR: Estimated Creatinine Clearance: 61.8 mL/min (by C-G formula based on SCr of 0.75 mg/dL). Recent Labs  Lab 12/13/23 2245 12/14/23 0654  PROCALCITON  --  0.30  WBC 11.4* 8.5  LATICACIDVEN 1.1  --     Liver Function Tests: Recent Labs  Lab 12/13/23 2245  AST 17  ALT 12  ALKPHOS 92  BILITOT 0.7  PROT 7.9  ALBUMIN   3.5   No results for input(s): LIPASE, AMYLASE in the last 168 hours. No results for input(s): AMMONIA in the last 168 hours.  ABG    Component Value Date/Time   PHART 7.31 (L) 09/30/2021 0500   PCO2ART 46 09/30/2021 0500   PO2ART 155 (H) 09/30/2021 0500   HCO3 24.8 08/29/2023 0307   ACIDBASEDEF 1.5 08/29/2023 0307   O2SAT 97.5 08/29/2023 0307     Coagulation Profile: Recent Labs  Lab 12/13/23 2245  INR 1.0   Cardiac Enzymes: No results for input(s): CKTOTAL, CKMB, CKMBINDEX, TROPONINI in the last 168 hours.  HbA1C: Hemoglobin A1C  Date/Time Value Ref Range Status  06/29/2013 04:02 AM 6.0 4.2 - 6.3 % Final    Comment:    The American Diabetes Association recommends that a primary goal of therapy should be <7% and that physicians should reevaluate the treatment regimen in patients with HbA1c values consistently >8%.    Hgb A1c MFr Bld  Date/Time Value Ref Range Status  08/29/2023 01:01 AM 9.4 (H) 4.8 - 5.6 % Final    Comment:    (NOTE) Pre diabetes:          5.7%-6.4%  Diabetes:              >6.4%  Glycemic control for   <7.0% adults with diabetes   02/19/2023 04:33 AM 10.3 (H) 4.8 - 5.6 % Final    Comment:    (NOTE)         Prediabetes: 5.7 - 6.4         Diabetes: >6.4         Glycemic control for adults with diabetes: <7.0    CBG: Recent Labs  Lab 12/14/23 1131 12/14/23 1617 12/14/23 1927 12/14/23 2342 12/15/23 0333  GLUCAP 303* 241* 211* 118* 144*   Review of Systems:   Unable to be obtained secondary to the patient is on trach collar  Past Medical History  She,  has a past medical history of Allergy, Anxiety, Asthma, CHF (congestive heart failure) (HCC), Cocaine abuse (HCC), COPD (chronic obstructive pulmonary disease) (HCC), Coronary artery disease, Emphysema/COPD (HCC), Hepatitis C, High cholesterol, Hypertension, Pneumothorax, and Tobacco abuse.   Surgical History    Past Surgical History:  Procedure Laterality Date    CARDIAC CATHETERIZATION     CHEST TUBE INSERTION     IR GASTROSTOMY TUBE MOD SED  10/31/2017   TRACHEOSTOMY TUBE PLACEMENT N/A 10/17/2017   Procedure: TRACHEOSTOMY;  Surgeon: Milissa Hamming, MD;  Location: [facility] ORS;  Service: ENT;  Laterality: N/A;     Social History   reports that she quit smoking about 6 years ago. Her smoking use included cigarettes. She started smoking about 47 years ago. She has a 4.1 pack-year smoking history. She has never used smokeless tobacco. She reports that she does not currently use drugs after having used the following drugs: Crack cocaine. She reports that she does not drink alcohol.   Family History   Her family history includes Asthma in her mother; Breast cancer (age of onset: 98) in her maternal aunt; CAD  in her father; COPD in her sister; CVA in her father; Lung cancer in her mother; Stroke in her father.   Allergies Allergies  Allergen Reactions   Lorazepam      Makes anxiety worse  Other Reaction(s): Makes anxiety worse   Lisinopril  Cough   Amoxicillin      Patient has tolerated cephalosporins in the past   Home Medications  Prior to Admission medications   Medication Sig Start Date End Date Taking? Authorizing Provider  acetaminophen  (TYLENOL ) 325 MG tablet Take 2 tablets (650 mg total) by mouth every 6 (six) hours as needed for mild pain (pain score 1-3) (or Fever >/= 101). 04/24/23   Dorinda Drue DASEN, MD  albuterol  (PROVENTIL ) (2.5 MG/3ML) 0.083% nebulizer solution Take 3 mLs (2.5 mg total) by nebulization every 6 (six) hours as needed for wheezing or shortness of breath. 03/27/23   Tamea Dedra CROME, MD  albuterol  (VENTOLIN  HFA) [phone] [id number] Base) MCG/ACT inhaler Inhale 2 puffs into the lungs every 6 (six) hours as needed for wheezing or shortness of breath. 04/02/22   Parrett, Madelin RAMAN, NP  amLODipine  (NORVASC ) 5 MG tablet Take 5 mg by mouth daily. 08/27/21   [provider]  aspirin  81 MG chewable tablet Chew 81 mg by mouth daily.      [provider]  azelastine  (ASTELIN ) 0.1 % nasal spray SMARTSIG:1-2 Spray(s) Both Nares Every 12 Hours PRN 09/22/20   [provider]  Blood Glucose Monitoring Suppl ([employer] BLOOD GLUCOSE METER) w/Device KIT Use as directed to monitor blood glucose up to qid 02/22/23   Alexander, Natalie, DO  cetirizine  (ZYRTEC ) 10 MG tablet Take 10 mg by mouth daily. 03/21/22   [provider]  clonazePAM  (KLONOPIN ) 0.5 MG tablet Take 0.5 mg by mouth in the morning, at noon, and at bedtime.    [provider]  cloNIDine  (CATAPRES  - DOSED IN MG/24 HR) 0.1 mg/24hr patch Place 0.1 mg onto the skin once a week.    [provider]  docusate sodium  (COLACE) 100 MG capsule Take 2 capsules (200 mg total) by mouth daily as needed for mild constipation. 04/24/23   Dorinda Drue DASEN, MD  doxycycline  (MONODOX ) 100 MG capsule Take 1 capsule (100 mg total) by mouth 2 (two) times daily. Take with food and drink 11/18/23   Claudene Lehmann, MD  fluticasone  (FLONASE ) 50 MCG/ACT nasal spray Place 1 spray into both nostrils daily.    [provider]  guaiFENesin  (MUCINEX ) 600 MG 12 hr tablet Take 600 mg by mouth 2 (two) times daily.    [provider]  hydrOXYzine  (VISTARIL ) 25 MG capsule Take 25 mg by mouth every 6 (six) hours as needed. 10/01/22   [provider]  ipratropium-albuterol  (DUONEB) 0.5-2.5 (3) MG/3ML SOLN Take 3 mLs by nebulization every 6 (six) hours as needed (for shortness of breath). 06/17/23   Kasa, Kurian, MD  LANTUS  SOLOSTAR 100 UNIT/ML Solostar Pen Inject 40 Units into the skin at bedtime. 01/03/23   [provider]  linezolid  (ZYVOX ) 600 MG tablet Take 1 tablet (600 mg total) by mouth every 12 (twelve) hours. 09/05/23   Nelson, Dana G, NP  naloxone [facility]) nasal spray 4 mg/0.1 mL Place 1 spray into the nose once. 10/22/22   [provider]  NOVOLOG  FLEXPEN 100 UNIT/ML FlexPen Inject 5-10 Units into the skin 3 (three) times daily with  meals. Sliding scale mealtime insulin . 03/01/21   [provider]  nystatin  ointment (MYCOSTATIN ) Apply 1 Application topically 3 (three) times  daily. 01/04/23   [provider]  oxyCODONE -acetaminophen  (PERCOCET/ROXICET) 5-325 MG tablet Take 1 tablet by mouth 3 (three) times daily as needed for pain. 09/07/21   [provider]  polyethylene glycol powder (GLYCOLAX /MIRALAX ) 17 GM/SCOOP powder Take by mouth.    [provider]  PULMICORT  0.5 MG/2ML nebulizer solution Take 0.5 mg by nebulization every 12 (twelve) hours. 02/13/21   [provider]  terbinafine (LAMISIL) 1 % cream Apply 1 application  topically 2 (two) times daily. To affected skin, for 1 week after resolution of rash. # 30 gm, 1 refill.    [provider]  TRULICITY 4.5 MG/0.5ML SOPN Inject 4.5 mg into the skin once a week. 09/28/21   [provider]  Vitamin D , Ergocalciferol , (DRISDOL ) 1.25 MG (50000 UNIT) CAPS capsule Take 50,000 Units by mouth every 7 (seven) days.    [provider]  fluconazole  (DIFLUCAN ) 150 MG tablet Take 1 tablet day 1 repeat in 1 week 11/15/20   Moye, Virginia , MD  Scheduled Meds:  Chlorhexidine  Gluconate Cloth  6 each Topical Daily   clonazePAM   0.5 mg Oral TID   famotidine   20 mg Oral BID   heparin   5,000 Units Subcutaneous Q8H   insulin  aspart  0-9 Units Subcutaneous Q4H   ipratropium-albuterol   3 mL Nebulization Q6H   Continuous Infusions:  piperacillin -tazobactam (ZOSYN )  IV 3.375 g (12/14/23 2138)   propofol  (DIPRIVAN ) infusion     PRN Meds:.docusate sodium , ipratropium-albuterol , labetalol , midazolam , oxyCODONE -acetaminophen , polyethylene glycol  Active Hospital Problem list   See systems below  Assessment & Plan:  #Acute on Chronic Hypoxic & Hypercapnic Respiratory failure in the setting of ... #Acute COPD Exacerbation  #Suspected Pneumonia #Mucus Plugging PMHx: COPD/Emphysema/Asthma with chronic tracheostomy and home vent.  Pseudomonas and Acinetobacter PNA  -Vent support Trilogy per home settings -titrate FiO2, PEEP to maintain O2 sat >90%  -PRN Chest X-ray & ABG -PRN and scheduled bronchodilators, hypertonic saline -Continue steroid   #Sepsis due to Suspected UTI and Possible Pneumonia -F/u cultures, trend lactic/ PCT -Monitor WBC/ fever curve -neg MRSA nasal swab, RVP, legionella, strep urine Ag -IV antibiotics Zosyn  -Gentle IVF hydration as needed -Pressors PRN for MAP goal >65 -Strict I/O's   #Anxiety/Depression -Prn Valium  -Continue Klonopin     #Type 2 Diabetes Mellitus, insulin  dependent -CBG's q4h; Target range of 140 to 180 -SSI -Follow ICU Hypo/Hyperglycemia protocol   #Hypertension Hx: Hyperlipidemia, CAD, CHF -Cardiac monitoring -MAP goal > 65, SBP < 160 -UDS neg -Continue home medications once evaluated by SLP    Best practice:  Diet:  Oral Pain/Anxiety/Delirium protocol (if indicated): No VAP protocol (if indicated): Yes DVT prophylaxis: Subcutaneous Heparin  GI prophylaxis: H2B Glucose control:  SSI Yes Central venous access:  N/A Arterial line:  N/A Foley:  N/A Mobility:  bed rest  PT consulted: N/A Last date of multidisciplinary goals of care discussion [7/12] Code Status:  full code Disposition: ICU   = Goals of Care = Code Status Order: FULL  Primary Emergency Contact: Gaumer,[name], Home Phone: [phone] Wishes to pursue full aggressive treatment and intervention options, including CPR and intubation, but goals of care will be addressed on going with family if that should become necessary.  Critical care time: 45 minutes        Almarie Nose DNP, CCRN, FNP-C, AGACNP-BC Acute Care & Family Nurse Practitioner [facility] PCCM on call pager [phone]

## 2023-12-15 NOTE — Consult Note (Signed)
 PHARMACY CONSULT NOTE - ELECTROLYTES  Pharmacy Consult for Electrolyte Monitoring and Replacement   Recent Labs: Height: 4' 10 (147.3 cm) Weight: 73 kg (160 lb 15 oz) IBW/kg (Calculated) : 40.9 Estimated Creatinine Clearance: 61.8 mL/min (by C-G formula based on SCr of 0.72 mg/dL). Potassium (mmol/L)  Date Value  12/15/2023 3.9  07/11/2014 4.6   Magnesium  (mg/dL)  Date Value  92/86/7974 1.9  10/31/2013 2.6 (H)   Calcium  (mg/dL)  Date Value  92/86/7974 8.5 (L)   Calcium , Total (mg/dL)  Date Value  97/92/7983 8.9   Albumin  (g/dL)  Date Value  92/88/7974 3.5  07/09/2014 3.7   Phosphorus (mg/dL)  Date Value  95/96/7974 3.6  10/31/2013 4.6   Sodium (mmol/L)  Date Value  12/15/2023 140  07/11/2014 139    Assessment  Michelle [name] is a 63 y.o. female presenting with shortness of breath, fever, and increased secretions. PMH significant for COPD, CHF, hypertension, hyperlipidemia, chronic respiratory failure status post tracheostomy on ventilator chronically. Pharmacy has been consulted to monitor and replace electrolytes.  Diet: regular MIVF: N/A Pertinent medications: N/A  Goal of Therapy: Electrolytes WNL  Plan:  Mag 1.9: Mag sulfate 2g IV x 1 Check BMP, Mg, Phos with AM labs  Thank you for allowing pharmacy to be a part of this patient's care.  Dilan Novosad A Sheana Bir, PharmD Clinical Pharmacist 12/15/2023 7:34 AM

## 2023-12-15 NOTE — Plan of Care (Signed)

## 2023-12-15 NOTE — Progress Notes (Signed)
 Monitor alarming ST-elevation following am bath, patient denies chest pain. Vitals document . Informed NP

## 2023-12-16 LAB — CBC
HCT: 33.4 % — ABNORMAL LOW (ref 36.0–46.0)
Hemoglobin: 10.4 g/dL — ABNORMAL LOW (ref 12.0–15.0)
MCH: 26.2 pg (ref 26.0–34.0)
MCHC: 31.1 g/dL (ref 30.0–36.0)
MCV: 84.1 fL (ref 80.0–100.0)
Platelets: 276 K/uL (ref 150–400)
RBC: 3.97 MIL/uL (ref 3.87–5.11)
RDW: 13.4 % (ref 11.5–15.5)
WBC: 5.1 K/uL (ref 4.0–10.5)
nRBC: 0 % (ref 0.0–0.2)

## 2023-12-16 LAB — BASIC METABOLIC PANEL WITH GFR
Anion gap: 10 (ref 5–15)
BUN: 10 mg/dL (ref 8–23)
CO2: 28 mmol/L (ref 22–32)
Calcium: 8.7 mg/dL — ABNORMAL LOW (ref 8.9–10.3)
Chloride: 106 mmol/L (ref 98–111)
Creatinine, Ser: 0.79 mg/dL (ref 0.44–1.00)
GFR, Estimated: >60 mL/min (ref >60)
Glucose, Bld: 207 mg/dL — ABNORMAL HIGH (ref 70–99)
Potassium: 4 mmol/L (ref 3.5–5.1)
Sodium: 144 mmol/L (ref 135–145)

## 2023-12-16 LAB — GLUCOSE, CAPILLARY
Glucose-Capillary: 299 mg/dL — ABNORMAL HIGH (ref 70–99)
Glucose-Capillary: 177 mg/dL — ABNORMAL HIGH (ref 70–99)
Glucose-Capillary: 211 mg/dL — ABNORMAL HIGH (ref 70–99)
Glucose-Capillary: 234 mg/dL — ABNORMAL HIGH (ref 70–99)
Glucose-Capillary: 190 mg/dL — ABNORMAL HIGH (ref 70–99)
Glucose-Capillary: 212 mg/dL — ABNORMAL HIGH (ref 70–99)

## 2023-12-16 LAB — LEGIONELLA PNEUMOPHILA SEROGP 1 UR AG: L. pneumophila Serogp 1 Ur Ag: NEGATIVE

## 2023-12-16 LAB — MAGNESIUM: Magnesium: 2.1 mg/dL (ref 1.7–2.4)

## 2023-12-16 LAB — PHOSPHORUS: Phosphorus: 2.5 mg/dL (ref 2.5–4.6)

## 2023-12-16 MED ORDER — MAGNESIUM SULFATE 2 GM/50ML IV SOLN
2.0000 g | Freq: Once | INTRAVENOUS | Status: AC
  Administered 2023-12-16: 2 g via INTRAVENOUS
  Filled 2023-12-16: qty 50

## 2023-12-16 MED ORDER — PAROXETINE HCL 30 MG PO TABS
30.0000 mg | ORAL_TABLET | Freq: Every day | ORAL | Status: DC
  Administered 2023-12-16 – 2023-12-20 (×5): 30 mg via ORAL
  Filled 2023-12-16 (×5): qty 1

## 2023-12-16 MED ORDER — MIRTAZAPINE 15 MG PO TABS
30.0000 mg | ORAL_TABLET | Freq: Every day | ORAL | Status: DC
  Administered 2023-12-16 – 2023-12-19 (×4): 30 mg via ORAL
  Filled 2023-12-16 (×4): qty 2

## 2023-12-16 NOTE — Plan of Care (Signed)
 The patient has been Aox4 but a little drowsy. A few anxiety home meds have been reinitiated. Voiding without problems. Currently on home vent setting. The patient is able to swallow her meds whole. Q2 turns. SQ heparin  has been administered.  Problem: Education: Goal: Ability to describe self-care measures that may prevent or decrease complications (Diabetes Survival Skills Education) will improve Outcome: Progressing Goal: Individualized Educational Video(s) Outcome: Progressing   Problem: Coping: Goal: Ability to adjust to condition or change in health will improve Outcome: Progressing   Problem: Fluid Volume: Goal: Ability to maintain a balanced intake and output will improve Outcome: Progressing   Problem: Health Behavior/Discharge Planning: Goal: Ability to identify and utilize available resources and services will improve Outcome: Progressing Goal: Ability to manage health-related needs will improve Outcome: Progressing   Problem: Metabolic: Goal: Ability to maintain appropriate glucose levels will improve Outcome: Progressing   Problem: Nutritional: Goal: Maintenance of adequate nutrition will improve Outcome: Progressing Goal: Progress toward achieving an optimal weight will improve Outcome: Progressing   Problem: Skin Integrity: Goal: Risk for impaired skin integrity will decrease Outcome: Progressing   Problem: Tissue Perfusion: Goal: Adequacy of tissue perfusion will improve Outcome: Progressing   Problem: Education: Goal: Knowledge of General Education information will improve Description: Including pain rating scale, medication(s)/side effects and non-pharmacologic comfort measures Outcome: Progressing   Problem: Health Behavior/Discharge Planning: Goal: Ability to manage health-related needs will improve Outcome: Progressing   Problem: Clinical Measurements: Goal: Ability to maintain clinical measurements within normal limits will improve Outcome:  Progressing Goal: Will remain free from infection Outcome: Progressing Goal: Diagnostic test results will improve Outcome: Progressing Goal: Respiratory complications will improve Outcome: Progressing Goal: Cardiovascular complication will be avoided Outcome: Progressing   Problem: Activity: Goal: Risk for activity intolerance will decrease Outcome: Progressing   Problem: Nutrition: Goal: Adequate nutrition will be maintained Outcome: Progressing   Problem: Coping: Goal: Level of anxiety will decrease Outcome: Progressing   Problem: Elimination: Goal: Will not experience complications related to bowel motility Outcome: Progressing Goal: Will not experience complications related to urinary retention Outcome: Progressing   Problem: Pain Managment: Goal: General experience of comfort will improve and/or be controlled Outcome: Progressing   Problem: Safety: Goal: Ability to remain free from injury will improve Outcome: Progressing   Problem: Skin Integrity: Goal: Risk for impaired skin integrity will decrease Outcome: Progressing

## 2023-12-16 NOTE — Progress Notes (Signed)
 Patient awake in respiratory distress. Remains on home vent. No setting changes. Volumes noted to be good. BBS noted to be diminished but ess clear. SVN given. Vitals stable despite sob. Minimal secretions suctioned from trach. Will continue to monitor

## 2023-12-16 NOTE — Plan of Care (Signed)

## 2023-12-16 NOTE — TOC Initial Note (Signed)
 Transition of Care [facility]) - Initial/Assessment Note    Patient Details  Name: Michelle [name] MRN: [id number] Date of Birth: 1960-07-31  Transition of [facility]) CM/SW Contact:    Michelle JAYSON Carpen, LCSW Phone Number: 12/16/2023, 1:32 PM  Clinical Narrative:    Readmission prevention screen complete. CSW met with patient. No family at bedside. CSW introduced role and explained that discharge planning would be discussed. PCP is Michelle [name]. Daughter, Michelle, drives her to appointments. [facility] [place] No issues affording medications. Patient lives home with her daughter, Michelle. Per notes from previous admissions, she is active with [facility] and [facility]. Patient confirmed she is still active with these agencies. Patient has home vent/trach supplies through [employer]. She also has a 3-wheeled walker and a shower chair. No further concerns. CSW will continue to follow patient for support and facilitate return home once stable. She will need ambulance transport at discharge. Address on facesheet is correct.              Expected Discharge Plan: Home w Home Health Services (Personal care services.) Barriers to Discharge: Continued Medical Work up   Patient Goals and CMS Choice            Expected Discharge Plan and Services     Post Acute Care Choice: Resumption of Svcs/PTA Provider Living arrangements for the past 2 months: Apartment                                      Prior Living Arrangements/Services Living arrangements for the past 2 months: Apartment Lives with:: Adult Children Patient language and need for interpreter reviewed:: Yes Do you feel safe going back to the place where you live?: Yes      Need for Family Participation in Patient Care: Yes (Comment) Care giver support system in place?: Yes (comment) Current home services: DME, Other (comment) (Private duty nursing) Criminal Activity/Legal Involvement Pertinent to Current  Situation/Hospitalization: No - Comment as needed  Activities of Daily Living      Permission Sought/Granted Permission sought to share information with : Family Supports Permission granted to share information with : Yes, Verbal Permission Granted  Share Information with NAME: Michelle [name]  Permission granted to share info w AGENCY: [facility] and [facility]  Permission granted to share info w Relationship: Daughter  Permission granted to share info w Contact Information: [phone]  Emotional Assessment Appearance:: Appears stated age Attitude/Demeanor/Rapport: Engaged, Gracious Affect (typically observed): Accepting, Appropriate, Calm, Pleasant Orientation: : Oriented to Self, Oriented to Place, Oriented to  Time, Oriented to Situation Alcohol / Substance Use: Not Applicable Psych Involvement: No (comment)  Admission diagnosis:  Sepsis due to urinary tract infection (HCC) [A41.9, N39.0] Pneumonia of right lower lobe due to infectious organism [J18.9] Acute sepsis [facility]) [A41.9] Patient Active Problem List   Diagnosis Date Noted   Sepsis due to urinary tract infection (HCC) 12/14/2023   Ventilator dependent (HCC) 09/02/2023   Sepsis due to pneumonia (HCC) 04/19/2023   Essential hypertension 04/19/2023   COPD exacerbation (HCC) 04/19/2023   Anxiety and depression 04/19/2023   Type 2 diabetes mellitus without complications (HCC) 04/19/2023   Abnormal CT scan 04/19/2023   Obesity (BMI 30-39.9) 02/19/2023   Dysuria 02/18/2023   Elevated troponin 02/18/2023   COVID-19 virus infection 02/18/2023   Tracheostomy in place [facility]) 04/02/2022   Acute on chronic respiratory  failure (HCC) 09/30/2021   Urine retention 02/07/2021   Candidiasis of breast 11/15/2020   Atypical pneumonia 08/29/2019   HCAP (healthcare-associated pneumonia) 08/28/2019   Pseudomonas respiratory infection 07/03/2019   Depression with anxiety 07/03/2019   Hyperlipidemia associated with type 2  diabetes mellitus (HCC) 07/03/2019   PEG (percutaneous endoscopic gastrostomy) status (HCC) 07/03/2019   Chronic pain 07/03/2019   Hypokalemia 07/03/2019   DM (diabetes mellitus) (HCC) 06/24/2019   Chronic respiratory failure requiring continuous mechanical ventilation through tracheostomy (HCC) 10/20/2018   CAD (coronary artery disease) 10/20/2018   HTN (hypertension) 10/20/2018   Chronic diastolic CHF (congestive heart failure) (HCC) 10/20/2018   Pneumonia 09/17/2018   Panic disorder 10/29/2017   Altered mental status    Pressure injury of skin 10/23/2017   Acute on chronic respiratory failure with hypoxia and hypercapnia (HCC) 06/29/2017   Acute on chronic respiratory failure with hypoxia (HCC) 02/23/2017   Protein-calorie malnutrition, severe 05/23/2016   Sepsis (HCC) 05/22/2016   Cocaine abuse (HCC) 04/07/2015   Malnutrition of moderate degree (HCC) 10/25/2014   COPD (chronic obstructive pulmonary disease) (HCC) 10/24/2014   Tobacco abuse 10/24/2014   Community acquired pneumonia 10/24/2014   PCP:  Michelle Michelle HERO, [name] Pharmacy:   [facility] - [place], [place] - [address] [place] [address] Phone: [phone] Fax: [phone]  [facility] [address] [place] - [address] [place] [address] Phone: [phone] Fax: [phone]  [id number] [place] [place] - [address] [place] [address] Phone: [phone] Fax: [phone]     Social Drivers of Health (SDOH) Social History: SDOH Screenings   Food Insecurity: No Food Insecurity (12/14/2023)  Housing: Low Risk  (12/14/2023)  Transportation Needs: No Transportation Needs (12/14/2023)  Utilities: Not At Risk (12/14/2023)  Financial Resource Strain: Medium Risk (06/29/2017)  Physical Activity: Inactive (06/29/2017)  Social Connections: Unknown (06/29/2017)  Stress: Stress Concern Present (06/29/2017)  Tobacco Use: Medium Risk (11/18/2023)    SDOH Interventions:     Readmission Risk Interventions    12/16/2023    1:28 PM 04/19/2023    2:10 PM 02/22/2023    1:47 PM  Readmission Risk Prevention Plan  Transportation Screening Complete Complete Complete  PCP or Specialist Appt within 3-5 Days  Complete Complete  HRI or Home Care Consult  Complete Complete  Social Work Consult for Recovery Care Planning/Counseling  Complete Complete  Palliative Care Screening  Complete Complete  Medication Review [employer]) Complete Complete Complete  PCP or Specialist appointment within 3-5 days of discharge Complete    HRI or Home Care Consult Complete    SW Recovery Care/Counseling Consult Complete    Palliative Care Screening Not Applicable    Skilled Nursing Facility Not Applicable

## 2023-12-16 NOTE — Progress Notes (Signed)
 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  She was prescribed antibiotics which she reports she did not complete.   ED Course: Initial vital signs showed HR of 116 beats/minute, BP mm Hg, the RR 29 breaths/minute, and the oxygen saturation 98% on TC and a temperature of 100.92F (38.2C).  Pertinent Labs/Diagnostics Findings: Glucose:195 otherwise unremarkable CMP WBC:11.4 K/L  COVID PCR: Negative Chest x-ray shows possible infiltrate versus atelectasis in the right lower lobe. CT scans obtained which shows pneumonia in the right lower lung  Patient given 30 cc/kg of fluids and started on broad-spectrum antibiotics Vanco cefepime  and Flagyl  for suspected sepsis with septic shock.  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  Labs/bloodwork stable , mild hypomag repleted, Remains on zosyn .  Trache site clean/ #6 Shiley tracheostomy, mentation lucid with mild anxiety required PRN anxiolytic, UOP 600 via [place], able to eat/have normal bm.  Plan for possible bronchoscopy today due to mucus plugging.   Consults:  None  Procedures:  None  Interim History / Subjective:      Micro Data:  7/12: SARS-CoV-2 PCR> negative 7/12: Influenza PCR> negative 7/12: Blood culture x2>no growth 7/12: MRSA PCR>> neg 7/12: Strep pneumo urinary antigen>neg 7/12: Legionella urinary antigen>  Antimicrobials:  Vancomycin  7/12x1 Cefepime  7/12 x1 Metronidazole  7/12 x1 Zosyn  7/13>>  OBJECTIVE  Blood pressure (!) 140/79, pulse 66, temperature 98.3 F (36.8 C), temperature source Oral, resp. rate 16, height 4' 10 (1.473 m), weight 71.8 kg, last menstrual period 03/06/2005, SpO2 100%.    Vent Mode: SIMV;PSV FiO2 (%):  [24 %] 24 % Set Rate:  [16 bmp] 16 bmp Vt Set:  [400 mL] 400 mL PEEP:  [5 cmH20] 5 cmH20 Pressure Support:  [10 cmH20] 10 cmH20   Intake/Output Summary (Last 24 hours) at 12/16/2023 0754 Last data filed at 12/16/2023 0701 Gross per 24 hour  Intake 194.82 ml  Output 1200 ml  Net -1005.18 ml   Filed Weights   12/14/23 0647 12/15/23 0400 12/16/23 0339  Weight: 76.3 kg 73 kg 71.8 kg   Physical Examination  GENERAL: Normal appearance. She is obese.  HEENT:     Head: Normocephalic and atraumatic.     Right Ear: External ear normal.     Left Ear: External ear normal.     Nose:  Nose normal.     Mouth/Throat:     Mouth: Mucous membranes are moist.     Extraocular Movements: Extraocular movements intact.     Pupils: Pupils are equal, round, and reactive to light.  CARDIOVASCULAR:S1 S2, no murmurs Intermittently tachycardic PULMONARY: Trach and Ventilator, Moderate secretions,  Lungs course with wheezes ABDOMINAL: NTND +BS slightly distended.  MUSCULOSKELETAL: No swelling. Normal range of motion. 4/5 strength in all extremities.   SKIN:Skin is warm and dry. Capillary Refill: Capillary refill takes less than 2 seconds.  NEUROLOGICAL: General: No focal deficit present. She is alert and oriented to person, place, and time baseline.  PSYCHIATRIC:  Mood and Affect: Mood normal.  Labs/imaging that I havepersonally reviewed  (right click and Reselect all SmartList Selections daily)   No results found.   Labs   CBC: Recent Labs  Lab 12/13/23 2245 12/14/23 0654 12/15/23 0411 12/16/23 0452  WBC 11.4* 8.5 7.5 5.1  NEUTROABS 8.2*  --   --   --   HGB 11.8* 10.8* 10.2* 10.4*  HCT 37.9 35.9* 32.6* 33.4*  MCV 84.4 85.1 82.5 84.1  PLT 262 234 247 276   Basic Metabolic Panel: Recent Labs  Lab 12/13/23 2245 12/14/23 0654 12/15/23 0411 12/16/23 0452  NA 136 137 140 144  K 3.5 3.6 3.9 4.0  CL 103 104 108 106  CO2 22 25 24 28   GLUCOSE 195* 289* 115* 207*  BUN 14 13 12 10   CREATININE 0.81 0.75 0.72 0.79  CALCIUM  8.9 8.4* 8.5* 8.7*  MG  --  1.5* 1.9 2.1  PHOS  --   --   --  2.5   GFR: Estimated Creatinine Clearance: 61.4 mL/min (by C-G formula based on SCr of 0.79 mg/dL). Recent Labs  Lab 12/13/23 2245 12/14/23 0654 12/15/23 0411 12/16/23 0452  PROCALCITON  --  0.30  --   --   WBC 11.4* 8.5 7.5 5.1  LATICACIDVEN 1.1  --   --   --     Liver Function Tests: Recent Labs  Lab 12/13/23 2245  AST 17  ALT 12  ALKPHOS 92  BILITOT 0.7  PROT 7.9  ALBUMIN  3.5   No results for input(s): LIPASE, AMYLASE in the last 168 hours. No results for input(s): AMMONIA in the last 168 hours.  ABG    Component Value Date/Time   PHART 7.31 (L) 09/30/2021 0500   PCO2ART 46 09/30/2021 0500   PO2ART 155 (H) 09/30/2021 0500   HCO3 24.8 08/29/2023 0307   ACIDBASEDEF 1.5 08/29/2023 0307   O2SAT 97.5 08/29/2023 0307     Coagulation Profile: Recent Labs   Lab 12/13/23 2245  INR 1.0   Cardiac Enzymes: No results for input(s): CKTOTAL, CKMB, CKMBINDEX, TROPONINI in the last 168 hours.  HbA1C: Hemoglobin A1C  Date/Time Value Ref Range Status  06/29/2013 04:02 AM 6.0 4.2 - 6.3 % Final    Comment:    The American Diabetes Association recommends that a primary goal of therapy should be <7% and that physicians should reevaluate the treatment regimen in patients with HbA1c values consistently >8%.    Hgb A1c MFr Bld  Date/Time Value Ref Range Status  08/29/2023 01:01 AM 9.4 (H) 4.8 - 5.6 % Final    Comment:    (NOTE) Pre diabetes:          5.7%-6.4%  Diabetes:              >6.4%  Glycemic control for   <7.0% adults with diabetes  02/19/2023 04:33 AM 10.3 (H) 4.8 - 5.6 % Final    Comment:    (NOTE)         Prediabetes: 5.7 - 6.4         Diabetes: >6.4         Glycemic control for adults with diabetes: <7.0    CBG: Recent Labs  Lab 12/15/23 1613 12/15/23 1949 12/15/23 2314 12/16/23 0340 12/16/23 0736  GLUCAP 182* 156* 177* 177* 211*   Review of Systems:   Unable to be obtained secondary to the patient is on trach collar  Past Medical History  She,  has a past medical history of Allergy, Anxiety, Asthma, CHF (congestive heart failure) (HCC), Cocaine abuse (HCC), COPD (chronic obstructive pulmonary disease) (HCC), Coronary artery disease, Emphysema/COPD (HCC), Hepatitis C, High cholesterol, Hypertension, Pneumothorax, and Tobacco abuse.   Surgical History    Past Surgical History:  Procedure Laterality Date   CARDIAC CATHETERIZATION     CHEST TUBE INSERTION     IR GASTROSTOMY TUBE MOD SED  10/31/2017   TRACHEOSTOMY TUBE PLACEMENT N/A 10/17/2017   Procedure: TRACHEOSTOMY;  Surgeon: Milissa Hamming, MD;  Location: [facility] ORS;  Service: ENT;  Laterality: N/A;     Social History   reports that she quit smoking about 6 years ago. Her smoking use included cigarettes. She started smoking about 47 years ago. She  has a 4.1 pack-year smoking history. She has never used smokeless tobacco. She reports that she does not currently use drugs after having used the following drugs: Crack cocaine. She reports that she does not drink alcohol.   Family History   Her family history includes Asthma in her mother; Breast cancer (age of onset: 36) in her maternal aunt; CAD in her father; COPD in her sister; CVA in her father; Lung cancer in her mother; Stroke in her father.   Allergies Allergies  Allergen Reactions   Lorazepam      Makes anxiety worse  Other Reaction(s): Makes anxiety worse   Lisinopril  Cough   Amoxicillin      Patient has tolerated cephalosporins in the past   Home Medications  Prior to Admission medications   Medication Sig Start Date End Date Taking? Authorizing Provider  acetaminophen  (TYLENOL ) 325 MG tablet Take 2 tablets (650 mg total) by mouth every 6 (six) hours as needed for mild pain (pain score 1-3) (or Fever >/= 101). 04/24/23   Dorinda Drue DASEN, MD  albuterol  (PROVENTIL ) (2.5 MG/3ML) 0.083% nebulizer solution Take 3 mLs (2.5 mg total) by nebulization every 6 (six) hours as needed for wheezing or shortness of breath. 03/27/23   Tamea Dedra CROME, MD  albuterol  (VENTOLIN  HFA) 108 (90 Base) MCG/ACT inhaler Inhale 2 puffs into the lungs every 6 (six) hours as needed for wheezing or shortness of breath. 04/02/22   Parrett, Madelin RAMAN, NP  amLODipine  (NORVASC ) 5 MG tablet Take 5 mg by mouth daily. 08/27/21   [provider]  aspirin  81 MG chewable tablet Chew 81 mg by mouth daily.     [provider]  azelastine  (ASTELIN ) 0.1 % nasal spray SMARTSIG:1-2 Spray(s) Both Nares Every 12 Hours PRN 09/22/20   [provider]  Blood Glucose Monitoring Suppl ([facility] BLOOD GLUCOSE METER) w/Device KIT Use as directed to monitor blood glucose up to qid 02/22/23   Alexander, Natalie, DO  cetirizine  (ZYRTEC ) 10 MG tablet Take 10 mg by mouth daily. 03/21/22   [provider]   clonazePAM  (KLONOPIN ) 0.5 MG tablet Take 0.5 mg by mouth  in the morning, at noon, and at bedtime.    [provider]  cloNIDine  (CATAPRES  - DOSED IN MG/24 HR) 0.1 mg/24hr patch Place 0.1 mg onto the skin once a week.    [provider]  docusate sodium  (COLACE) 100 MG capsule Take 2 capsules (200 mg total) by mouth daily as needed for mild constipation. 04/24/23   Dorinda Drue DASEN, MD  doxycycline  (MONODOX ) 100 MG capsule Take 1 capsule (100 mg total) by mouth 2 (two) times daily. Take with food and drink 11/18/23   Claudene Lehmann, MD  fluticasone  (FLONASE ) 50 MCG/ACT nasal spray Place 1 spray into both nostrils daily.    [provider]  guaiFENesin  (MUCINEX ) 600 MG 12 hr tablet Take 600 mg by mouth 2 (two) times daily.    [provider]  hydrOXYzine  (VISTARIL ) 25 MG capsule Take 25 mg by mouth every 6 (six) hours as needed. 10/01/22   [provider]  ipratropium-albuterol  (DUONEB) 0.5-2.5 (3) MG/3ML SOLN Take 3 mLs by nebulization every 6 (six) hours as needed (for shortness of breath). 06/17/23   Kasa, Kurian, MD  LANTUS  SOLOSTAR 100 UNIT/ML Solostar Pen Inject 40 Units into the skin at bedtime. 01/03/23   [provider]  linezolid  (ZYVOX ) 600 MG tablet Take 1 tablet (600 mg total) by mouth every 12 (twelve) hours. 09/05/23   Nelson, Dana G, NP  naloxone [facility]) nasal spray 4 mg/0.1 mL Place 1 spray into the nose once. 10/22/22   [provider]  NOVOLOG  FLEXPEN 100 UNIT/ML FlexPen Inject 5-10 Units into the skin 3 (three) times daily with meals. Sliding scale mealtime insulin . 03/01/21   [provider]  nystatin  ointment (MYCOSTATIN ) Apply 1 Application topically 3 (three) times daily. 01/04/23   [provider]  oxyCODONE -acetaminophen  (PERCOCET/ROXICET) 5-325 MG tablet Take 1 tablet by mouth 3 (three) times daily as needed for pain. 09/07/21   [provider]  polyethylene glycol powder (GLYCOLAX /MIRALAX ) 17  GM/SCOOP powder Take by mouth.    [provider]  PULMICORT  0.5 MG/2ML nebulizer solution Take 0.5 mg by nebulization every 12 (twelve) hours. 02/13/21   [provider]  terbinafine (LAMISIL) 1 % cream Apply 1 application  topically 2 (two) times daily. To affected skin, for 1 week after resolution of rash. # 30 gm, 1 refill.    [provider]  TRULICITY 4.5 MG/0.5ML SOPN Inject 4.5 mg into the skin once a week. 09/28/21   [provider]  Vitamin D , Ergocalciferol , (DRISDOL ) 1.25 MG (50000 UNIT) CAPS capsule Take 50,000 Units by mouth every 7 (seven) days.    [provider]  fluconazole  (DIFLUCAN ) 150 MG tablet Take 1 tablet day 1 repeat in 1 week 11/15/20   Moye, [place] , MD  Scheduled Meds:  Chlorhexidine  Gluconate Cloth  6 each Topical Daily   clonazePAM   0.5 mg Oral TID   famotidine   20 mg Oral BID   heparin   5,000 Units Subcutaneous Q8H   insulin  aspart  0-9 Units Subcutaneous Q4H   ipratropium-albuterol   3 mL Nebulization Q6H   Continuous Infusions:  piperacillin -tazobactam (ZOSYN )  IV 12.5 mL/hr at 12/16/23 0754   propofol  (DIPRIVAN ) infusion     PRN Meds:.docusate sodium , ipratropium-albuterol , labetalol , midazolam , oxyCODONE -acetaminophen , polyethylene glycol  Active Hospital Problem list   See systems below  Assessment & Plan:  #Acute on Chronic Hypoxic & Hypercapnic Respiratory failure in the setting of ... #Acute COPD Exacerbation  #Mucus Plugging PMHx: COPD/Emphysema/Asthma with chronic tracheostomy and home vent. Pseudomonas and  Acinetobacter PNA  -Vent support Trilogy per home settings -titrate FiO2, PEEP to maintain O2 sat >90%  -PRN Chest X-ray & ABG    #Sepsis HAS BEEN RULED OUT -F/u cultures, trend lactic/ PCT -Monitor WBC/ fever curve -neg MRSA nasal swab, RVP, legionella, strep urine Ag -IV antibiotics Zosyn  -Gentle IVF hydration as needed -Pressors PRN for MAP goal >65 -Strict I/O's    #Anxiety/Depression -Prn Valium  -Continue Klonopin     #Type 2 Diabetes Mellitus, insulin  dependent -CBG's q4h; Target range of 140 to 180 -SSI -Follow ICU Hypo/Hyperglycemia protocol   #Hypertension Hx: Hyperlipidemia, CAD, CHF -Cardiac monitoring -MAP goal > 65, SBP < 160 -UDS neg -Continue home medications once evaluated by SLP    Best practice:  Diet:  Oral Pain/Anxiety/Delirium protocol (if indicated): No VAP protocol (if indicated): Yes DVT prophylaxis: Subcutaneous Heparin  GI prophylaxis: H2B Glucose control:  SSI Yes Central venous access:  N/A Arterial line:  N/A Foley:  N/A Mobility:  bed rest  PT consulted: N/A Last date of multidisciplinary goals of care discussion [7/12] Code Status:  full code Disposition: ICU   = Goals of Care = Code Status Order: FULL  Primary Emergency Contact: Lingle,[name], Home Phone: [phone] Wishes to pursue full aggressive treatment and intervention options, including CPR and intubation, but goals of care will be addressed on going with family if that should become necessary.   Critical care provider statement:   Total critical care time: 33 minutes   Performed by: Parris MD   Critical care time was exclusive of separately billable procedures and treating other patients.   Critical care was necessary to treat or prevent imminent or life-threatening deterioration.   Critical care was time spent personally by me on the following activities: development of treatment plan with patient and/or surrogate as well as nursing, discussions with consultants, evaluation of patient's response to treatment, examination of patient, obtaining history from patient or surrogate, ordering and performing treatments and interventions, ordering and review of laboratory studies, ordering and review of radiographic studies, pulse oximetry and re-evaluation of patient's condition.    Angie Hogg, M.D.  Pulmonary & Critical Care Medicine

## 2023-12-16 NOTE — Consult Note (Signed)
 PHARMACY CONSULT NOTE - ELECTROLYTES  Pharmacy Consult for Electrolyte Monitoring and Replacement   Recent Labs: Height: 4' 10 (147.3 cm) Weight: 71.8 kg (158 lb 4.6 oz) IBW/kg (Calculated) : 40.9 Estimated Creatinine Clearance: 61.4 mL/min (by C-G formula based on SCr of 0.79 mg/dL). Potassium (mmol/L)  Date Value  12/16/2023 4.0  07/11/2014 4.6   Magnesium  (mg/dL)  Date Value  92/85/7974 2.1  10/31/2013 2.6 (H)   Calcium  (mg/dL)  Date Value  92/85/7974 8.7 (L)   Calcium , Total (mg/dL)  Date Value  97/92/7983 8.9   Albumin  (g/dL)  Date Value  92/88/7974 3.5  07/09/2014 3.7   Phosphorus (mg/dL)  Date Value  92/85/7974 2.5  10/31/2013 4.6   Sodium (mmol/L)  Date Value  12/16/2023 144  07/11/2014 139    Assessment  Michelle [name] is a 63 y.o. female presenting with shortness of breath, fever, and increased secretions. PMH significant for COPD, CHF, hypertension, hyperlipidemia, chronic respiratory failure status post tracheostomy on ventilator chronically. Pharmacy has been consulted to monitor and replace electrolytes.  Diet: regular MIVF: N/A Pertinent medications: N/A  Goal of Therapy: Electrolytes WNL  Plan:  No electrolyte replacement warranted for today Check BMP, Mg, Phos with AM labs  Thank you for allowing pharmacy to be a part of this patient's care.  [name], PharmD Clinical Pharmacist 12/16/2023 7:03 AM

## 2023-12-17 ENCOUNTER — Inpatient Hospital Stay

## 2023-12-17 LAB — GLUCOSE, CAPILLARY
Glucose-Capillary: 124 mg/dL — ABNORMAL HIGH (ref 70–99)
Glucose-Capillary: 129 mg/dL — ABNORMAL HIGH (ref 70–99)
Glucose-Capillary: 148 mg/dL — ABNORMAL HIGH (ref 70–99)
Glucose-Capillary: 149 mg/dL — ABNORMAL HIGH (ref 70–99)
Glucose-Capillary: 168 mg/dL — ABNORMAL HIGH (ref 70–99)
Glucose-Capillary: 207 mg/dL — ABNORMAL HIGH (ref 70–99)
Glucose-Capillary: 250 mg/dL — ABNORMAL HIGH (ref 70–99)

## 2023-12-17 LAB — BASIC METABOLIC PANEL WITH GFR
Anion gap: 6 (ref 5–15)
BUN: 10 mg/dL (ref 8–23)
CO2: 28 mmol/L (ref 22–32)
Calcium: 8.9 mg/dL (ref 8.9–10.3)
Chloride: 108 mmol/L (ref 98–111)
Creatinine, Ser: 0.76 mg/dL (ref 0.44–1.00)
GFR, Estimated: >60 mL/min (ref >60)
Glucose, Bld: 159 mg/dL — ABNORMAL HIGH (ref 70–99)
Potassium: 3.5 mmol/L (ref 3.5–5.1)
Sodium: 142 mmol/L (ref 135–145)

## 2023-12-17 LAB — CBC
HCT: 37.7 % (ref 36.0–46.0)
Hemoglobin: 11.5 g/dL — ABNORMAL LOW (ref 12.0–15.0)
MCH: 25.6 pg — ABNORMAL LOW (ref 26.0–34.0)
MCHC: 30.5 g/dL (ref 30.0–36.0)
MCV: 83.8 fL (ref 80.0–100.0)
Platelets: 328 K/uL (ref 150–400)
RBC: 4.5 MIL/uL (ref 3.87–5.11)
RDW: 13.4 % (ref 11.5–15.5)
WBC: 7 K/uL (ref 4.0–10.5)
nRBC: 0 % (ref 0.0–0.2)

## 2023-12-17 LAB — MAGNESIUM: Magnesium: 2.2 mg/dL (ref 1.7–2.4)

## 2023-12-17 MED ORDER — FENTANYL CITRATE PF 50 MCG/ML IJ SOSY
50.0000 ug | PREFILLED_SYRINGE | Freq: Once | INTRAMUSCULAR | Status: AC
  Administered 2023-12-17: 50 ug via INTRAVENOUS
  Filled 2023-12-17: qty 1

## 2023-12-17 MED ORDER — MIDAZOLAM HCL 2 MG/2ML IJ SOLN
2.0000 mg | Freq: Once | INTRAMUSCULAR | Status: AC
  Administered 2023-12-17: 2 mg via INTRAVENOUS
  Filled 2023-12-17: qty 2

## 2023-12-17 MED ORDER — POTASSIUM CHLORIDE CRYS ER 20 MEQ PO TBCR
40.0000 meq | EXTENDED_RELEASE_TABLET | Freq: Once | ORAL | Status: AC
  Administered 2023-12-17: 40 meq via ORAL
  Filled 2023-12-17: qty 2

## 2023-12-17 MED ORDER — ADULT MULTIVITAMIN W/MINERALS CH
1.0000 | ORAL_TABLET | Freq: Every day | ORAL | Status: DC
  Administered 2023-12-18 – 2023-12-20 (×3): 1 via ORAL
  Filled 2023-12-17 (×3): qty 1

## 2023-12-17 MED ORDER — FENTANYL CITRATE PF 50 MCG/ML IJ SOSY
25.0000 ug | PREFILLED_SYRINGE | Freq: Once | INTRAMUSCULAR | Status: AC
  Administered 2023-12-17: 25 ug via INTRAVENOUS
  Filled 2023-12-17: qty 1

## 2023-12-17 MED ORDER — ENSURE MAX PROTEIN PO LIQD
11.0000 [oz_av] | Freq: Two times a day (BID) | ORAL | Status: DC
  Administered 2023-12-17 – 2023-12-20 (×4): 11 [oz_av] via ORAL

## 2023-12-17 NOTE — Plan of Care (Signed)
 The patient has been Aox4, Purewick in place. Bronchoscopy performed at the bedside. Fluid sample has been obtained and has been tube down by RT to be analyzed. No acute anxiety episodes reported today.  Problem: Education: Goal: Ability to describe self-care measures that may prevent or decrease complications (Diabetes Survival Skills Education) will improve Outcome: Progressing Goal: Individualized Educational Video(s) Outcome: Progressing   Problem: Coping: Goal: Ability to adjust to condition or change in health will improve Outcome: Progressing   Problem: Fluid Volume: Goal: Ability to maintain a balanced intake and output will improve Outcome: Progressing   Problem: Health Behavior/Discharge Planning: Goal: Ability to identify and utilize available resources and services will improve Outcome: Progressing Goal: Ability to manage health-related needs will improve Outcome: Progressing   Problem: Metabolic: Goal: Ability to maintain appropriate glucose levels will improve Outcome: Progressing   Problem: Nutritional: Goal: Maintenance of adequate nutrition will improve Outcome: Progressing Goal: Progress toward achieving an optimal weight will improve Outcome: Progressing   Problem: Skin Integrity: Goal: Risk for impaired skin integrity will decrease Outcome: Progressing   Problem: Tissue Perfusion: Goal: Adequacy of tissue perfusion will improve Outcome: Progressing   Problem: Education: Goal: Knowledge of General Education information will improve Description: Including pain rating scale, medication(s)/side effects and non-pharmacologic comfort measures Outcome: Progressing   Problem: Health Behavior/Discharge Planning: Goal: Ability to manage health-related needs will improve Outcome: Progressing   Problem: Clinical Measurements: Goal: Ability to maintain clinical measurements within normal limits will improve Outcome: Progressing Goal: Will remain free from  infection Outcome: Progressing Goal: Diagnostic test results will improve Outcome: Progressing Goal: Respiratory complications will improve Outcome: Progressing Goal: Cardiovascular complication will be avoided Outcome: Progressing   Problem: Activity: Goal: Risk for activity intolerance will decrease Outcome: Progressing   Problem: Nutrition: Goal: Adequate nutrition will be maintained Outcome: Progressing   Problem: Coping: Goal: Level of anxiety will decrease Outcome: Progressing   Problem: Elimination: Goal: Will not experience complications related to bowel motility Outcome: Progressing Goal: Will not experience complications related to urinary retention Outcome: Progressing   Problem: Pain Managment: Goal: General experience of comfort will improve and/or be controlled Outcome: Progressing   Problem: Safety: Goal: Ability to remain free from injury will improve Outcome: Progressing   Problem: Skin Integrity: Goal: Risk for impaired skin integrity will decrease Outcome: Progressing

## 2023-12-17 NOTE — Progress Notes (Signed)
 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  She was prescribed antibiotics which she reports she did not complete.   ED Course: Initial vital signs showed HR of 116 beats/minute, BP mm Hg, the RR 29 breaths/minute, and the oxygen saturation 98% on TC and a temperature of 100.50F (38.2C).  Pertinent Labs/Diagnostics Findings: Glucose:195 otherwise unremarkable CMP WBC:11.4 K/L  COVID PCR: Negative Chest x-ray shows possible infiltrate versus atelectasis in the right lower lobe. CT scans obtained which shows pneumonia in the right lower lung  Patient given 30 cc/kg of fluids and started on broad-spectrum antibiotics Vanco cefepime  and Flagyl  for suspected sepsis with septic shock.  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  Labs/bloodwork stable , mild hypomag repleted, Remains on zosyn .  Trache site clean/ #6 Shiley tracheostomy, mentation lucid with mild anxiety required PRN anxiolytic, UOP 600 via Purwick, able to eat/have normal bm.  Plan for possible bronchoscopy today due to mucus plugging.  12/17/23- no overnight events, Shiley 6 exchanged, still coughing up thick mucus with difficulty expectorating.  Plan for bronchoscopy with therapeutic aspiration and BAL for microbiologic workup    Consults:  None  Procedures:  None  Interim History / Subjective:      Micro Data:  7/12: SARS-CoV-2 PCR> negative 7/12: Influenza PCR> negative 7/12: Blood culture x2>no growth 7/12: MRSA PCR>> neg 7/12: Strep pneumo urinary antigen>neg 7/12: Legionella urinary antigen>  Antimicrobials:  Vancomycin  7/12x1 Cefepime  7/12 x1 Metronidazole  7/12 x1 Zosyn  7/13>>  OBJECTIVE  Blood pressure 96/71, pulse 63, temperature 98 F (36.7 C), temperature source Oral, resp. rate 16, height 4' 10 (1.473 m), weight 72.9 kg, last menstrual period 03/06/2005, SpO2 95%.    Vent Mode: PRVC FiO2 (%):  [24 %] 24 % Set Rate:  [16 bmp] 16 bmp Vt Set:  [400 mL] 400 mL PEEP:  [5 cmH20] 5 cmH20   Intake/Output Summary (Last 24 hours) at 12/17/2023 0853 Last data filed at 12/17/2023 0748 Gross per 24 hour  Intake 670.39 ml  Output 600 ml  Net 70.39 ml   Filed Weights   12/15/23 0400 12/16/23 0339 12/17/23 0500  Weight: 73 kg 71.8 kg 72.9 kg   Physical Examination  GENERAL: Normal appearance. She is obese.  HEENT:     Head: Normocephalic and atraumatic.  Right Ear: External ear normal.     Left Ear: External ear normal.     Nose: Nose normal.     Mouth/Throat:     Mouth: Mucous membranes are moist.     Extraocular Movements: Extraocular movements intact.     Pupils: Pupils are equal,  round, and reactive to light.  CARDIOVASCULAR:S1 S2, no murmurs Intermittently tachycardic PULMONARY: Trach and Ventilator, Moderate secretions, Lungs course with wheezes ABDOMINAL: NTND +BS slightly distended.  MUSCULOSKELETAL: No swelling. Normal range of motion. 4/5 strength in all extremities.   SKIN:Skin is warm and dry. Capillary Refill: Capillary refill takes less than 2 seconds.  NEUROLOGICAL: General: No focal deficit present. She is alert and oriented to person, place, and time baseline.  PSYCHIATRIC:  Mood and Affect: Mood normal.  Labs/imaging that I havepersonally reviewed  (right click and Reselect all SmartList Selections daily)   No results found.   Labs   CBC: Recent Labs  Lab 12/13/23 2245 12/14/23 0654 12/15/23 0411 12/16/23 0452 12/17/23 0241  WBC 11.4* 8.5 7.5 5.1 7.0  NEUTROABS 8.2*  --   --   --   --   HGB 11.8* 10.8* 10.2* 10.4* 11.5*  HCT 37.9 35.9* 32.6* 33.4* 37.7  MCV 84.4 85.1 82.5 84.1 83.8  PLT 262 234 247 276 328   Basic Metabolic Panel: Recent Labs  Lab 12/13/23 2245 12/14/23 0654 12/15/23 0411 12/16/23 0452 12/17/23 0241  NA 136 137 140 144 142  K 3.5 3.6 3.9 4.0 3.5  CL 103 104 108 106 108  CO2 22 25 24 28 28   GLUCOSE 195* 289* 115* 207* 159*  BUN 14 13 12 10 10   CREATININE 0.81 0.75 0.72 0.79 0.76  CALCIUM  8.9 8.4* 8.5* 8.7* 8.9  MG  --  1.5* 1.9 2.1 2.2  PHOS  --   --   --  2.5  --    GFR: Estimated Creatinine Clearance: 61.8 mL/min (by C-G formula based on SCr of 0.76 mg/dL). Recent Labs  Lab 12/13/23 2245 12/14/23 0654 12/15/23 0411 12/16/23 0452 12/17/23 0241  PROCALCITON  --  0.30  --   --   --   WBC 11.4* 8.5 7.5 5.1 7.0  LATICACIDVEN 1.1  --   --   --   --     Liver Function Tests: Recent Labs  Lab 12/13/23 2245  AST 17  ALT 12  ALKPHOS 92  BILITOT 0.7  PROT 7.9  ALBUMIN  3.5   No results for input(s): LIPASE, AMYLASE in the last 168 hours. No results for input(s): AMMONIA in the last 168  hours.  ABG    Component Value Date/Time   PHART 7.31 (L) 09/30/2021 0500   PCO2ART 46 09/30/2021 0500   PO2ART 155 (H) 09/30/2021 0500   HCO3 24.8 08/29/2023 0307   ACIDBASEDEF 1.5 08/29/2023 0307   O2SAT 97.5 08/29/2023 0307     Coagulation Profile: Recent Labs  Lab 12/13/23 2245  INR 1.0   Cardiac Enzymes: No results for input(s): CKTOTAL, CKMB, CKMBINDEX, TROPONINI in the last 168 hours.  HbA1C: Hemoglobin A1C  Date/Time Value Ref Range Status  06/29/2013 04:02 AM 6.0 4.2 - 6.3 % Final    Comment:    The American Diabetes Association recommends that a primary goal of therapy should be <7% and that physicians should reevaluate the treatment regimen in patients with HbA1c values consistently >8%.    Hgb A1c MFr Bld  Date/Time Value Ref Range Status  08/29/2023 01:01 AM 9.4 (H) 4.8 -  5.6 % Final    Comment:    (NOTE) Pre diabetes:          5.7%-6.4%  Diabetes:              >6.4%  Glycemic control for   <7.0% adults with diabetes   02/19/2023 04:33 AM 10.3 (H) 4.8 - 5.6 % Final    Comment:    (NOTE)         Prediabetes: 5.7 - 6.4         Diabetes: >6.4         Glycemic control for adults with diabetes: <7.0    CBG: Recent Labs  Lab 12/16/23 1529 12/16/23 2055 12/17/23 0030 12/17/23 0439 12/17/23 0809  GLUCAP 190* 212* 148* 168* 129*   Review of Systems:   Unable to be obtained secondary to the patient is on trach collar  Past Medical History  She,  has a past medical history of Allergy, Anxiety, Asthma, CHF (congestive heart failure) (HCC), Cocaine abuse (HCC), COPD (chronic obstructive pulmonary disease) (HCC), Coronary artery disease, Emphysema/COPD (HCC), Hepatitis C, High cholesterol, Hypertension, Pneumothorax, and Tobacco abuse.   Surgical History    Past Surgical History:  Procedure Laterality Date   CARDIAC CATHETERIZATION     CHEST TUBE INSERTION     IR GASTROSTOMY TUBE MOD SED  10/31/2017   TRACHEOSTOMY TUBE PLACEMENT N/A  10/17/2017   Procedure: TRACHEOSTOMY;  Surgeon: Milissa Hamming, MD;  Location: [facility] ORS;  Service: ENT;  Laterality: N/A;     Social History   reports that she quit smoking about 6 years ago. Her smoking use included cigarettes. She started smoking about 47 years ago. She has a 4.1 pack-year smoking history. She has never used smokeless tobacco. She reports that she does not currently use drugs after having used the following drugs: Crack cocaine. She reports that she does not drink alcohol.   Family History   Her family history includes Asthma in her mother; Breast cancer (age of onset: 9) in her maternal aunt; CAD in her father; COPD in her sister; CVA in her father; Lung cancer in her mother; Stroke in her father.   Allergies Allergies  Allergen Reactions   Lorazepam      Makes anxiety worse  Other Reaction(s): Makes anxiety worse   Lisinopril  Cough   Amoxicillin      Patient has tolerated cephalosporins in the past   Home Medications  Prior to Admission medications   Medication Sig Start Date End Date Taking? Authorizing Provider  acetaminophen  (TYLENOL ) 325 MG tablet Take 2 tablets (650 mg total) by mouth every 6 (six) hours as needed for mild pain (pain score 1-3) (or Fever >/= 101). 04/24/23   Dorinda Drue DASEN, MD  albuterol  (PROVENTIL ) (2.5 MG/3ML) 0.083% nebulizer solution Take 3 mLs (2.5 mg total) by nebulization every 6 (six) hours as needed for wheezing or shortness of breath. 03/27/23   Tamea Dedra CROME, MD  albuterol  (VENTOLIN  HFA) 108 [phone] Base) MCG/ACT inhaler Inhale 2 puffs into the lungs every 6 (six) hours as needed for wheezing or shortness of breath. 04/02/22   Parrett, Madelin RAMAN, NP  amLODipine  (NORVASC ) 5 MG tablet Take 5 mg by mouth daily. 08/27/21   [provider]  aspirin  81 MG chewable tablet Chew 81 mg by mouth daily.     [provider]  azelastine  (ASTELIN ) 0.1 % nasal spray SMARTSIG:1-2 Spray(s) Both Nares Every 12 Hours PRN 09/22/20    [provider]  Blood Glucose Monitoring Suppl (  [employer] BLOOD GLUCOSE METER) w/Device KIT Use as directed to monitor blood glucose up to qid 02/22/23   Alexander, Natalie, DO  cetirizine  (ZYRTEC ) 10 MG tablet Take 10 mg by mouth daily. 03/21/22   [provider]  clonazePAM  (KLONOPIN ) 0.5 MG tablet Take 0.5 mg by mouth in the morning, at noon, and at bedtime.    [provider]  cloNIDine  (CATAPRES  - DOSED IN MG/24 HR) 0.1 mg/24hr patch Place 0.1 mg onto the skin once a week.    [provider]  docusate sodium  (COLACE) 100 MG capsule Take 2 capsules (200 mg total) by mouth daily as needed for mild constipation. 04/24/23   Dorinda Drue DASEN, MD  doxycycline  (MONODOX ) 100 MG capsule Take 1 capsule (100 mg total) by mouth 2 (two) times daily. Take with food and drink 11/18/23   Claudene Lehmann, MD  fluticasone  (FLONASE ) 50 MCG/ACT nasal spray Place 1 spray into both nostrils daily.    [provider]  guaiFENesin  (MUCINEX ) 600 MG 12 hr tablet Take 600 mg by mouth 2 (two) times daily.    [provider]  hydrOXYzine  (VISTARIL ) 25 MG capsule Take 25 mg by mouth every 6 (six) hours as needed. 10/01/22   [provider]  ipratropium-albuterol  (DUONEB) 0.5-2.5 (3) MG/3ML SOLN Take 3 mLs by nebulization every 6 (six) hours as needed (for shortness of breath). 06/17/23   Kasa, Kurian, MD  LANTUS  SOLOSTAR 100 UNIT/ML Solostar Pen Inject 40 Units into the skin at bedtime. 01/03/23   [provider]  linezolid  (ZYVOX ) 600 MG tablet Take 1 tablet (600 mg total) by mouth every 12 (twelve) hours. 09/05/23   Nelson, Dana G, NP  naloxone [facility]) nasal spray 4 mg/0.1 mL Place 1 spray into the nose once. 10/22/22   [provider]  NOVOLOG  FLEXPEN 100 UNIT/ML FlexPen Inject 5-10 Units into the skin 3 (three) times daily with meals. Sliding scale mealtime insulin . 03/01/21   [provider]  nystatin  ointment (MYCOSTATIN ) Apply 1 Application  topically 3 (three) times daily. 01/04/23   [provider]  oxyCODONE -acetaminophen  (PERCOCET/ROXICET) 5-325 MG tablet Take 1 tablet by mouth 3 (three) times daily as needed for pain. 09/07/21   [provider]  polyethylene glycol powder (GLYCOLAX /MIRALAX ) 17 GM/SCOOP powder Take by mouth.    [provider]  PULMICORT  0.5 MG/2ML nebulizer solution Take 0.5 mg by nebulization every 12 (twelve) hours. 02/13/21   [provider]  terbinafine (LAMISIL) 1 % cream Apply 1 application  topically 2 (two) times daily. To affected skin, for 1 week after resolution of rash. # 30 gm, 1 refill.    [provider]  TRULICITY 4.5 MG/0.5ML SOPN Inject 4.5 mg into the skin once a week. 09/28/21   [provider]  Vitamin D , Ergocalciferol , (DRISDOL ) 1.25 MG (50000 UNIT) CAPS capsule Take 50,000 Units by mouth every 7 (seven) days.    [provider]  fluconazole  (DIFLUCAN ) 150 MG tablet Take 1 tablet day 1 repeat in 1 week 11/15/20   Moye, Virginia , MD  Scheduled Meds:  Chlorhexidine  Gluconate Cloth  6 each Topical Daily   clonazePAM   0.5 mg Oral TID   famotidine   20 mg Oral BID   heparin   5,000 Units Subcutaneous Q8H   insulin  aspart  0-9 Units Subcutaneous Q4H   ipratropium-albuterol   3 mL Nebulization Q6H   mirtazapine   30 mg Oral QHS   PARoxetine   30 mg Oral Daily   Continuous Infusions:  piperacillin -tazobactam (ZOSYN )  IV  12.5 mL/hr at 12/17/23 9276   propofol  (DIPRIVAN ) infusion     PRN Meds:.docusate sodium , ipratropium-albuterol , labetalol , midazolam , oxyCODONE -acetaminophen , polyethylene glycol  Active Hospital Problem list   See systems below  Assessment & Plan:  #Acute on Chronic Hypoxic & Hypercapnic Respiratory failure in the setting of ... #Acute COPD Exacerbation  #Mucus Plugging PMHx: COPD/Emphysema/Asthma with chronic tracheostomy and home vent. Pseudomonas and Acinetobacter PNA  -Vent support Trilogy per home  settings -titrate FiO2, PEEP to maintain O2 sat >90%  -PRN Chest X-ray & ABG    #Sepsis HAS BEEN RULED OUT -F/u cultures, trend lactic/ PCT -Monitor WBC/ fever curve -neg MRSA nasal swab, RVP, legionella, strep urine Ag -IV antibiotics Zosyn  -Gentle IVF hydration as needed -Pressors PRN for MAP goal >65 -Strict I/O's   #Anxiety/Depression -Prn Valium  -Continue Klonopin     #Type 2 Diabetes Mellitus, insulin  dependent -CBG's q4h; Target range of 140 to 180 -SSI -Follow ICU Hypo/Hyperglycemia protocol   #Hypertension Hx: Hyperlipidemia, CAD, CHF -Cardiac monitoring -MAP goal > 65, SBP < 160 -UDS neg -Continue home medications once evaluated by SLP    Best practice:  Diet:  Oral Pain/Anxiety/Delirium protocol (if indicated): No VAP protocol (if indicated): Yes DVT prophylaxis: Subcutaneous Heparin  GI prophylaxis: H2B Glucose control:  SSI Yes Central venous access:  N/A Arterial line:  N/A Foley:  N/A Mobility:  bed rest  PT consulted: N/A Last date of multidisciplinary goals of care discussion [7/12] Code Status:  full code Disposition: ICU   = Goals of Care = Code Status Order: FULL  Primary Emergency Contact: Marcum,[name], Home Phone: [phone] Wishes to pursue full aggressive treatment and intervention options, including CPR and intubation, but goals of care will be addressed on going with family if that should become necessary.   Critical care provider statement:   Total critical care time: 33 minutes   Performed by: Parris MD   Critical care time was exclusive of separately billable procedures and treating other patients.   Critical care was necessary to treat or prevent imminent or life-threatening deterioration.   Critical care was time spent personally by me on the following activities: development of treatment plan with patient and/or surrogate as well as nursing, discussions with consultants, evaluation of patient's response to treatment,  examination of patient, obtaining history from patient or surrogate, ordering and performing treatments and interventions, ordering and review of laboratory studies, ordering and review of radiographic studies, pulse oximetry and re-evaluation of patient's condition.    Wyona Neils, M.D.  Pulmonary & Critical Care Medicine

## 2023-12-17 NOTE — Procedures (Signed)
 PROCEDURE: BRONCHOSCOPY Therapeutic Aspiration of Tracheobronchial Tree and Bronchoalveolar lavage  PROCEDURE DATE: 12/17/2023  TIME:  NAME:  Michelle [name]  DOB:[birth year]  MRN: [id number] LOC:  IC02A/IC02A-AA    HOSP DAY: @LENGTHOFSTAYDAYS @ CODE STATUS:      Code Status Orders  (From admission, onward)           Start     Ordered   12/14/23 0334  Full code  Continuous       Question:  By:  Answer:  Default: patient does not have capacity for decision making, no surrogate or prior directive available   12/14/23 0335           Code Status History     Date Active Date Inactive Code Status Order ID Comments User Context   08/29/2023 0029 09/05/2023 1924 Full Code [id number]  Nelwyn Bristle, NP Inpatient   04/19/2023 0346 04/24/2023 2203 Limited: Do not attempt resuscitation (DNR) -DNR-LIMITED -Do Not Intubate/DNI  [id number]  Lawence Madison LABOR, MD ED   02/18/2023 1431 02/23/2023 0201 Limited: Do not attempt resuscitation (DNR) -DNR-LIMITED -Do Not Intubate/DNI  [id number]  Cox, Amy N, DO ED   09/30/2021 0227 10/06/2021 0038 DNR [id number]  Rust-Chester, Jenita CROME, NP ED   08/28/2019 2231 09/03/2019 2127 DNR [id number]  Sim Emery CROME, MD ED   08/07/2019 0127 08/12/2019 2351 DNR [id number]  Shellia Inge BIRCH, NP ED   08/07/2019 0045 08/07/2019 0127 Full Code [id number]  Shellia Inge BIRCH, NP ED   06/24/2019 2324 07/05/2019 2242 DNR [id number]  Cristescu, Angela MATSU, MD ED   03/08/2019 1512 03/14/2019 1548 DNR [id number]  Wilfrid Rockie Overly, MD Inpatient   10/20/2018 0306 10/23/2018 1843 Full Code [id number]  Jenel Lenis, MD ED   09/17/2018 0631 09/20/2018 2006 Full Code [id number]  Mansy, Madison LABOR, MD ED   10/28/2017 1711 12/31/2017 1911 Partial Code [id number]  Signa Salines, NP Inpatient   09/30/2017 1945 10/28/2017 1711 Full Code [id number]  Telford Ingle, MD Inpatient   08/20/2017 1713 08/24/2017 1743 Full Code [id number]  Tobie Press, MD Inpatient   06/29/2017 0625 07/02/2017 2008 Full Code [id number]  Stephania Ozell RAMAN Inpatient   02/23/2017 0241 02/26/2017 1634 Full Code [id number]  Stephania Ozell RAMAN Inpatient   01/01/2017 1144 01/04/2017 1354 Full Code [id number]  Barbette Cea, MD Inpatient   05/22/2016 2155 05/27/2016 1645 Full Code [id number]  Tobie Calix, MD Inpatient   04/23/2016 2020 04/27/2016 1848 Full Code [id number]  Oleh Lenis POUR, MD ED   02/07/2016 1922 02/10/2016 1835 Full Code [id number]  Sherial Bail, MD Inpatient   11/29/2015 0519 12/01/2015 1609 Full Code [id number]  Stephania Ozell RAMAN Inpatient   11/05/2015 0911 11/07/2015 1606 Full Code [id number]  Yisroel Sleight, MD ED   10/24/2014 1522 10/27/2014 1630 Full Code [id number]  Yisroel Sleight, MD Inpatient           Indications/Preliminary Diagnosis:   Consent: (Place X beside choice/s below)  The benefits, risks and possible complications of the procedure were        explained to:  __x_ patient  ___ patient's family  ___ other:___________  who verbalized understanding and gave:  _x__ verbal  ___ written  ___ verbal and written  ___ telephone  ___ other:________ consent.      Unable to obtain consent; procedure performed on emergent basis.     Other:       PRESEDATION ASSESSMENT: History and Physical has been performed. Patient meds and allergies have been reviewed. Presedation airway examination has been performed  and documented. Baseline vital signs, sedation score, oxygenation status, and cardiac rhythm were reviewed. Patient was deemed to be in satisfactory condition to undergo the procedure.    PROCEDURE DONE UNDER MODERATE SEDATION   PREMEDICATIONS:   Sedative/Narcotic Amt Dose   Versed  2 mg   Fentanyl  25 mcg  Diprivan   mg            PROCEDURE DETAILS: Timeout performed and correct patient, name, & ID confirmed. Following prep per Pulmonary policy, appropriate sedation was administered. The Bronchoscope was inserted in to oral cavity with bite block in place. Therapeutic aspiration of Tracheobronchial tree was  performed.  Airway exam proceeded with findings, technical procedures, and specimen collection as noted below. At the end of exam the scope was withdrawn without incident. Impression and Plan as noted below.           Airway Prep (Place X beside choice below)   1% Transtracheal Lidocaine  Anesthetization 7 cc   Patient prepped per Bronchoscopy Lab Policy       Insertion Route (Place X beside choice below)   Nasal   Oral   Endotracheal Tube  X Tracheostomy   INTRAPROCEDURE MEDICATIONS:  Sedative/Narcotic Amt Dose   Versed   mg   Fentanyl  25 mcg  Diprivan   mg       Medication Amt Dose  Medication Amt Dose  Lidocaine  1%  cc  Epinephrine  1:10,000 sol  cc  Xylocaine  4%  cc  Cocaine  cc   TECHNICAL PROCEDURES: (Place X beside choice below)   Procedures  Description    None     Electrocautery     Cryotherapy     Balloon Dilatation     Bronchography     Stent Placement   X  Therapeutic Aspiration Lll, rll, rml    Laser/Argon Plasma    Brachytherapy Catheter Placement    Foreign Body Removal         SPECIMENS (Sites): (Place X beside choice below)  Specimens Description   No Specimens Obtained     Washings   X Lavage rll   Biopsies    Fine Needle Aspirates    Brushings    Sputum    FINDINGS:  MUCUS PLUGGING OF BILATERAL LUNGS.  THERAPEUTIC ASPIRATION WAS PERFORMED BILATERALLY. BAL WAS PERFORMED AT [facility].  MICROBIOLOGY TESTING WAS ORDERED AND IN PROCESS  ESTIMATED BLOOD LOSS: none COMPLICATIONS/RESOLUTION: none        Hever Castilleja, M.D.  Pulmonary & Critical Care Medicine  [facility]

## 2023-12-17 NOTE — Consult Note (Signed)
 PHARMACY CONSULT NOTE - ELECTROLYTES  Pharmacy Consult for Electrolyte Monitoring and Replacement   Recent Labs: Height: 4' 10 (147.3 cm) Weight: 72.9 kg (160 lb 11.5 oz) IBW/kg (Calculated) : 40.9 Estimated Creatinine Clearance: 61.8 mL/min (by C-G formula based on SCr of 0.76 mg/dL). Potassium (mmol/L)  Date Value  12/17/2023 3.5  07/11/2014 4.6   Magnesium  (mg/dL)  Date Value  92/84/7974 2.2  10/31/2013 2.6 (H)   Calcium  (mg/dL)  Date Value  92/84/7974 8.9   Calcium , Total (mg/dL)  Date Value  97/92/7983 8.9   Albumin  (g/dL)  Date Value  92/88/7974 3.5  07/09/2014 3.7   Phosphorus (mg/dL)  Date Value  92/85/7974 2.5  10/31/2013 4.6   Sodium (mmol/L)  Date Value  12/17/2023 142  07/11/2014 139    Assessment  Michelle [name] is a 63 y.o. female presenting with shortness of breath, fever, and increased secretions. PMH significant for COPD, CHF, hypertension, hyperlipidemia, chronic respiratory failure status post tracheostomy on ventilator chronically. Pharmacy has been consulted to monitor and replace electrolytes.  Pertinent medications: N/A  Goal of Therapy: Electrolytes WNL  Plan:  40 mEq po KCl x 1 Check BMP, Mg with AM labs  Thank you for allowing pharmacy to be a part of this patient's care.  [name], PharmD Clinical Pharmacist 12/17/2023 7:20 AM

## 2023-12-17 NOTE — Progress Notes (Signed)
 TRACH CHANGED WITHOUT INCIDENT.COLOR CHANGE ON ETCO2.PT TOLERATED WELL.

## 2023-12-17 NOTE — Plan of Care (Signed)
  Problem: Education: Goal: Ability to describe self-care measures that may prevent or decrease complications (Diabetes Survival Skills Education) will improve Outcome: Progressing   Problem: Coping: Goal: Ability to adjust to condition or change in health will improve Outcome: Progressing   Problem: Fluid Volume: Goal: Ability to maintain a balanced intake and output will improve Outcome: Progressing   Problem: Skin Integrity: Goal: Risk for impaired skin integrity will decrease Outcome: Progressing   Problem: Tissue Perfusion: Goal: Adequacy of tissue perfusion will improve Outcome: Progressing   Problem: Education: Goal: Knowledge of General Education information will improve Description: Including pain rating scale, medication(s)/side effects and non-pharmacologic comfort measures Outcome: Progressing

## 2023-12-17 NOTE — TOC Progression Note (Signed)
 Transition of Care [facility]) - Progression Note    Patient Details  Name: LAVETTE YANKOVICH MRN: [id number] Date of Birth: [birth year]  [facility]) CM/SW Contact  Lauraine JAYSON Carpen, LCSW Phone Number: 12/17/2023, 1:33 PM  Clinical Narrative:  CSW left voicemail for [facility] [phone]) to see if they would need any documentation from the hospital when she is discharged. PHI [phone]) will need discharge summary faxed to [phone].   Expected Discharge Plan: Home w Home Health Services (Personal care services.) Barriers to Discharge: Continued Medical Work up  Expected Discharge Plan and Services     Post Acute Care Choice: Resumption of Svcs/PTA Provider Living arrangements for the past 2 months: Apartment                                       Social Determinants of Health (SDOH) Interventions SDOH Screenings   Food Insecurity: No Food Insecurity (12/14/2023)  Housing: Low Risk  (12/14/2023)  Transportation Needs: No Transportation Needs (12/14/2023)  Utilities: Not At Risk (12/14/2023)  Financial Resource Strain: Medium Risk (06/29/2017)  Physical Activity: Inactive (06/29/2017)  Social Connections: Unknown (06/29/2017)  Stress: Stress Concern Present (06/29/2017)  Tobacco Use: Medium Risk (11/18/2023)    Readmission Risk Interventions    12/16/2023    1:28 PM 04/19/2023    2:10 PM 02/22/2023    1:47 PM  Readmission Risk Prevention Plan  Transportation Screening Complete Complete Complete  PCP or Specialist Appt within 3-5 Days  Complete Complete  HRI or Home Care Consult  Complete Complete  Social Work Consult for Recovery Care Planning/Counseling  Complete Complete  Palliative Care Screening  Complete Complete  Medication Review Oceanographer) Complete Complete Complete  PCP or Specialist appointment within 3-5 days of discharge Complete    HRI or Home Care Consult Complete    SW Recovery Care/Counseling Consult Complete    Palliative  Care Screening Not Applicable    Skilled Nursing Facility Not Applicable

## 2023-12-18 ENCOUNTER — Inpatient Hospital Stay

## 2023-12-18 LAB — BASIC METABOLIC PANEL WITH GFR
Anion gap: 8 (ref 5–15)
BUN: 10 mg/dL (ref 8–23)
CO2: 25 mmol/L (ref 22–32)
Calcium: 8.8 mg/dL — ABNORMAL LOW (ref 8.9–10.3)
Chloride: 109 mmol/L (ref 98–111)
Creatinine, Ser: 0.78 mg/dL (ref 0.44–1.00)
GFR, Estimated: >60 mL/min (ref >60)
Glucose, Bld: 138 mg/dL — ABNORMAL HIGH (ref 70–99)
Potassium: 4.1 mmol/L (ref 3.5–5.1)
Sodium: 142 mmol/L (ref 135–145)

## 2023-12-18 LAB — CBC
HCT: 37.2 % (ref 36.0–46.0)
Hemoglobin: 11.3 g/dL — ABNORMAL LOW (ref 12.0–15.0)
MCH: 25.7 pg — ABNORMAL LOW (ref 26.0–34.0)
MCHC: 30.4 g/dL (ref 30.0–36.0)
MCV: 84.7 fL (ref 80.0–100.0)
Platelets: 303 K/uL (ref 150–400)
RBC: 4.39 MIL/uL (ref 3.87–5.11)
RDW: 13.6 % (ref 11.5–15.5)
WBC: 7.8 K/uL (ref 4.0–10.5)
nRBC: 0 % (ref 0.0–0.2)

## 2023-12-18 LAB — GLUCOSE, CAPILLARY
Glucose-Capillary: 124 mg/dL — ABNORMAL HIGH (ref 70–99)
Glucose-Capillary: 170 mg/dL — ABNORMAL HIGH (ref 70–99)
Glucose-Capillary: 171 mg/dL — ABNORMAL HIGH (ref 70–99)
Glucose-Capillary: 186 mg/dL — ABNORMAL HIGH (ref 70–99)
Glucose-Capillary: 194 mg/dL — ABNORMAL HIGH (ref 70–99)
Glucose-Capillary: 197 mg/dL — ABNORMAL HIGH (ref 70–99)

## 2023-12-18 LAB — MAGNESIUM: Magnesium: 2.1 mg/dL (ref 1.7–2.4)

## 2023-12-18 LAB — PHOSPHORUS: Phosphorus: 3.4 mg/dL (ref 2.5–4.6)

## 2023-12-18 MED ORDER — FENTANYL CITRATE PF 50 MCG/ML IJ SOSY
50.0000 ug | PREFILLED_SYRINGE | Freq: Once | INTRAMUSCULAR | Status: AC
  Administered 2023-12-18: 50 ug via INTRAVENOUS

## 2023-12-18 MED ORDER — FENTANYL CITRATE PF 50 MCG/ML IJ SOSY
PREFILLED_SYRINGE | INTRAMUSCULAR | Status: AC
  Filled 2023-12-18: qty 1

## 2023-12-18 MED ORDER — MIDAZOLAM HCL 2 MG/2ML IJ SOLN
2.0000 mg | Freq: Once | INTRAMUSCULAR | Status: AC
  Administered 2023-12-18: 2 mg via INTRAVENOUS

## 2023-12-18 MED ORDER — AMOXICILLIN-POT CLAVULANATE 875-125 MG PO TABS
1.0000 | ORAL_TABLET | Freq: Two times a day (BID) | ORAL | Status: DC
  Administered 2023-12-18 – 2023-12-20 (×5): 1 via ORAL
  Filled 2023-12-18 (×5): qty 1

## 2023-12-18 NOTE — Consult Note (Signed)
 PHARMACY CONSULT NOTE - ELECTROLYTES  Pharmacy Consult for Electrolyte Monitoring and Replacement   Recent Labs: Height: 4' 10 (147.3 cm) Weight: 73.9 kg (162 lb 14.7 oz) IBW/kg (Calculated) : 40.9 Estimated Creatinine Clearance: 62.3 mL/min (by C-G formula based on SCr of 0.78 mg/dL). Potassium (mmol/L)  Date Value  12/18/2023 4.1  07/11/2014 4.6   Magnesium  (mg/dL)  Date Value  92/83/7974 2.1  10/31/2013 2.6 (H)   Calcium  (mg/dL)  Date Value  92/83/7974 8.8 (L)   Calcium , Total (mg/dL)  Date Value  97/92/7983 8.9   Albumin  (g/dL)  Date Value  92/88/7974 3.5  07/09/2014 3.7   Phosphorus (mg/dL)  Date Value  92/83/7974 3.4  10/31/2013 4.6   Sodium (mmol/L)  Date Value  12/18/2023 142  07/11/2014 139    Assessment  Michelle [name] is a 63 y.o. female presenting with shortness of breath, fever, and increased secretions. PMH significant for COPD, CHF, hypertension, hyperlipidemia, chronic respiratory failure status post tracheostomy on ventilator chronically. Pharmacy has been consulted to monitor and replace electrolytes.  Pertinent medications: N/A  Goal of Therapy: Electrolytes WNL  Plan:  No electrolyte replacement warranted for today Check BMP, Mg with AM labs  Thank you for allowing pharmacy to be a part of this patient's care.  Michelle [name], PharmD Clinical Pharmacist 12/18/2023 7:10 AM

## 2023-12-18 NOTE — Progress Notes (Signed)
 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  She was prescribed antibiotics which she reports she did not complete.   ED Course: Initial vital signs showed HR of 116 beats/minute, BP mm Hg, the RR 29 breaths/minute, and the oxygen saturation 98% on TC and a temperature of 100.78F (38.2C).  Pertinent Labs/Diagnostics Findings: Glucose:195 otherwise unremarkable CMP WBC:11.4 K/L  COVID PCR: Negative Chest x-ray shows possible infiltrate versus atelectasis in the right lower lobe. CT scans obtained which shows pneumonia in the right lower lung  Patient given 30 cc/kg of fluids and started on broad-spectrum antibiotics Vanco cefepime  and Flagyl  for suspected sepsis with septic shock.  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  Labs/bloodwork stable , mild hypomag repleted, Remains on zosyn .  Trache site clean/ #6 Shiley tracheostomy, mentation lucid with mild anxiety required PRN anxiolytic, UOP 600 via [place], able to eat/have normal bm.  Plan for possible bronchoscopy today due to mucus plugging.  12/17/23- no overnight events, Shiley 6 exchanged, still coughing up thick mucus with difficulty expectorating.  Plan for bronchoscopy with therapeutic aspiration and BAL for microbiologic workup 12/18/23- patient improved, vitals are improved. Mucus plugging is much improved. Will optimize for TRH transfer and initiate PT/OT   Consults:  None  Procedures:  None  Interim History / Subjective:      Micro Data:  7/12: SARS-CoV-2 PCR> negative 7/12: Influenza PCR> negative 7/12: Blood culture x2>no growth 7/12: MRSA PCR>> neg 7/12: Strep pneumo urinary antigen>neg 7/12: Legionella urinary antigen>  Antimicrobials:  Vancomycin  7/12x1 Cefepime  7/12 x1 Metronidazole  7/12 x1 Zosyn  7/13>>  OBJECTIVE  Blood pressure 129/78, pulse 80, temperature 98.7 F (37.1 C), temperature source Oral, resp. rate 19, height 4' 10 (1.473 m), weight 73.9 kg, last menstrual period 03/06/2005, SpO2 96%.    Vent Mode: SIMV FiO2 (%):  [24 %-40 %] 28 % Set Rate:  [16 bmp] 16 bmp Vt Set:  [400 mL] 400 mL PEEP:  [5 cmH20] 5 cmH20 Pressure Support:  [10 cmH20] 10 cmH20   Intake/Output Summary (Last 24 hours) at 12/18/2023 0900 Last data filed at 12/18/2023 0645 Gross per 24 hour  Intake 603.81 ml  Output 575 ml  Net 28.81 ml   Filed Weights   12/16/23 0339 12/17/23 0500 12/18/23 0318  Weight: 71.8 kg 72.9  kg 73.9 kg   Physical Examination  GENERAL: Normal appearance. She is obese.  HEENT:     Head: Normocephalic and atraumatic.     Right Ear: External ear normal.     Left Ear: External ear  normal.     Nose: Nose normal.     Mouth/Throat:     Mouth: Mucous membranes are moist.     Extraocular Movements: Extraocular movements intact.     Pupils: Pupils are equal, round, and reactive to light.  CARDIOVASCULAR:S1 S2, no murmurs Intermittently tachycardic PULMONARY: Trach and Ventilator, Moderate secretions, Lungs course with wheezes ABDOMINAL: NTND +BS slightly distended.  MUSCULOSKELETAL: No swelling. Normal range of motion. 4/5 strength in all extremities.   SKIN:Skin is warm and dry. Capillary Refill: Capillary refill takes less than 2 seconds.  NEUROLOGICAL: General: No focal deficit present. She is alert and oriented to person, place, and time baseline.  PSYCHIATRIC:  Mood and Affect: Mood normal.  Labs/imaging that I havepersonally reviewed  (right click and Reselect all SmartList Selections daily)   DG Chest Port 1 View Result Date: 12/17/2023 EXAM: 1 VIEW XRAY OF THE CHEST 12/17/2023 08:27:35 PM COMPARISON: Radiograph 12/14/2023 and CT 12/14/2023. CLINICAL HISTORY: Status post bronchoscopy. FINDINGS: LUNGS AND PLEURA: Patchy basilar airspace opacities may be due to atelectasis, aspiration, or pneumonia. Small bilateral pleural effusions. No pneumothorax. HEART AND MEDIASTINUM: Stable cardiomediastinal silhouette. BONES AND SOFT TISSUES: No acute osseous abnormality. IMPRESSION: 1. Patchy basilar airspace opacities, possibly due to atelectasis, aspiration, or pneumonia. 2. Small bilateral pleural effusions. 3. No pneumothorax. Electronically signed by: Norman Gatlin MD 12/17/2023 08:45 PM EDT RP Workstation: [id number]     Labs   CBC: Recent Labs  Lab 12/13/23 2245 12/14/23 0654 12/15/23 0411 12/16/23 0452 12/17/23 0241  WBC 11.4* 8.5 7.5 5.1 7.0  NEUTROABS 8.2*  --   --   --   --   HGB 11.8* 10.8* 10.2* 10.4* 11.5*  HCT 37.9 35.9* 32.6* 33.4* 37.7  MCV 84.4 85.1 82.5 84.1 83.8  PLT 262 234 247 276 328   Basic Metabolic Panel: Recent Labs  Lab 12/14/23 0654  12/15/23 0411 12/16/23 0452 12/17/23 0241 12/18/23 0523  NA 137 140 144 142 142  K 3.6 3.9 4.0 3.5 4.1  CL 104 108 106 108 109  CO2 25 24 28 28 25   GLUCOSE 289* 115* 207* 159* 138*  BUN 13 12 10 10 10   CREATININE 0.75 0.72 0.79 0.76 0.78  CALCIUM  8.4* 8.5* 8.7* 8.9 8.8*  MG 1.5* 1.9 2.1 2.2 2.1  PHOS  --   --  2.5  --  3.4   GFR: Estimated Creatinine Clearance: 62.3 mL/min (by C-G formula based on SCr of 0.78 mg/dL). Recent Labs  Lab 12/13/23 2245 12/14/23 0654 12/15/23 0411 12/16/23 0452 12/17/23 0241  PROCALCITON  --  0.30  --   --   --   WBC 11.4* 8.5 7.5 5.1 7.0  LATICACIDVEN 1.1  --   --   --   --     Liver Function Tests: Recent Labs  Lab 12/13/23 2245  AST 17  ALT 12  ALKPHOS 92  BILITOT 0.7  PROT 7.9  ALBUMIN  3.5   No results for input(s): LIPASE, AMYLASE in the last 168 hours. No results for input(s): AMMONIA in the last 168 hours.  ABG    Component Value Date/Time   PHART 7.31 (L) 09/30/2021 0500   PCO2ART 46 09/30/2021 0500   PO2ART 155 (H) 09/30/2021 0500   HCO3 24.8  08/29/2023 0307   ACIDBASEDEF 1.5 08/29/2023 0307   O2SAT 97.5 08/29/2023 0307     Coagulation Profile: Recent Labs  Lab 12/13/23 2245  INR 1.0   Cardiac Enzymes: No results for input(s): CKTOTAL, CKMB, CKMBINDEX, TROPONINI in the last 168 hours.  HbA1C: Hemoglobin A1C  Date/Time Value Ref Range Status  06/29/2013 04:02 AM 6.0 4.2 - 6.3 % Final    Comment:    The American Diabetes Association recommends that a primary goal of therapy should be <7% and that physicians should reevaluate the treatment regimen in patients with HbA1c values consistently >8%.    Hgb A1c MFr Bld  Date/Time Value Ref Range Status  08/29/2023 01:01 AM 9.4 (H) 4.8 - 5.6 % Final    Comment:    (NOTE) Pre diabetes:          5.7%-6.4%  Diabetes:              >6.4%  Glycemic control for   <7.0% adults with diabetes   02/19/2023 04:33 AM 10.3 (H) 4.8 - 5.6 % Final     Comment:    (NOTE)         Prediabetes: 5.7 - 6.4         Diabetes: >6.4         Glycemic control for adults with diabetes: <7.0    CBG: Recent Labs  Lab 12/17/23 1545 12/17/23 1955 12/17/23 2358 12/18/23 0315 12/18/23 0720  GLUCAP 149* 250* 124* 170* 124*   Review of Systems:   Unable to be obtained secondary to the patient is on trach collar  Past Medical History  She,  has a past medical history of Allergy, Anxiety, Asthma, CHF (congestive heart failure) (HCC), Cocaine abuse (HCC), COPD (chronic obstructive pulmonary disease) (HCC), Coronary artery disease, Emphysema/COPD (HCC), Hepatitis C, High cholesterol, Hypertension, Pneumothorax, and Tobacco abuse.   Surgical History    Past Surgical History:  Procedure Laterality Date   CARDIAC CATHETERIZATION     CHEST TUBE INSERTION     IR GASTROSTOMY TUBE MOD SED  10/31/2017   TRACHEOSTOMY TUBE PLACEMENT N/A 10/17/2017   Procedure: TRACHEOSTOMY;  Surgeon: Milissa Hamming, MD;  Location: [facility] ORS;  Service: ENT;  Laterality: N/A;     Social History   reports that she quit smoking about 6 years ago. Her smoking use included cigarettes. She started smoking about 47 years ago. She has a 4.1 pack-year smoking history. She has never used smokeless tobacco. She reports that she does not currently use drugs after having used the following drugs: Crack cocaine. She reports that she does not drink alcohol.   Family History   Her family history includes Asthma in her mother; Breast cancer (age of onset: 74) in her maternal aunt; CAD in her father; COPD in her sister; CVA in her father; Lung cancer in her mother; Stroke in her father.   Allergies Allergies  Allergen Reactions   Lorazepam      Makes anxiety worse  Other Reaction(s): Makes anxiety worse   Lisinopril  Cough   Amoxicillin      Patient has tolerated cephalosporins in the past   Home Medications  Prior to Admission medications   Medication Sig Start Date End Date  Taking? Authorizing Provider  acetaminophen  (TYLENOL ) 325 MG tablet Take 2 tablets (650 mg total) by mouth every 6 (six) hours as needed for mild pain (pain score 1-3) (or Fever >/= 101). 04/24/23   Dorinda Drue DASEN, MD  albuterol  (PROVENTIL ) (2.5 MG/3ML) 0.083% nebulizer solution Take  3 mLs (2.5 mg total) by nebulization every 6 (six) hours as needed for wheezing or shortness of breath. 03/27/23   Tamea Dedra CROME, MD  albuterol  (VENTOLIN  HFA) 108 (90 Base) MCG/ACT inhaler Inhale 2 puffs into the lungs every 6 (six) hours as needed for wheezing or shortness of breath. 04/02/22   Parrett, Madelin RAMAN, NP  amLODipine  (NORVASC ) 5 MG tablet Take 5 mg by mouth daily. 08/27/21   [provider]  aspirin  81 MG chewable tablet Chew 81 mg by mouth daily.     [provider]  azelastine  (ASTELIN ) 0.1 % nasal spray SMARTSIG:1-2 Spray(s) Both Nares Every 12 Hours PRN 09/22/20   [provider]  Blood Glucose Monitoring Suppl ([employer] BLOOD GLUCOSE METER) w/Device KIT Use as directed to monitor blood glucose up to qid 02/22/23   Alexander, Natalie, DO  cetirizine  (ZYRTEC ) 10 MG tablet Take 10 mg by mouth daily. 03/21/22   [provider]  clonazePAM  (KLONOPIN ) 0.5 MG tablet Take 0.5 mg by mouth in the morning, at noon, and at bedtime.    [provider]  cloNIDine  (CATAPRES  - DOSED IN MG/24 HR) 0.1 mg/24hr patch Place 0.1 mg onto the skin once a week.    [provider]  docusate sodium  (COLACE) 100 MG capsule Take 2 capsules (200 mg total) by mouth daily as needed for mild constipation. 04/24/23   Dorinda Drue DASEN, MD  doxycycline  (MONODOX ) 100 MG capsule Take 1 capsule (100 mg total) by mouth 2 (two) times daily. Take with food and drink 11/18/23   Claudene Lehmann, MD  fluticasone  (FLONASE ) 50 MCG/ACT nasal spray Place 1 spray into both nostrils daily.    [provider]  guaiFENesin  (MUCINEX ) 600 MG 12 hr tablet Take 600 mg by mouth 2 (two) times daily.     [provider]  hydrOXYzine  (VISTARIL ) 25 MG capsule Take 25 mg by mouth every 6 (six) hours as needed. 10/01/22   [provider]  ipratropium-albuterol  (DUONEB) 0.5-2.5 (3) MG/3ML SOLN Take 3 mLs by nebulization every 6 (six) hours as needed (for shortness of breath). 06/17/23   Kasa, Kurian, MD  LANTUS  SOLOSTAR 100 UNIT/ML Solostar Pen Inject 40 Units into the skin at bedtime. 01/03/23   [provider]  linezolid  (ZYVOX ) 600 MG tablet Take 1 tablet (600 mg total) by mouth every 12 (twelve) hours. 09/05/23   Nelson, Dana G, NP  naloxone [facility]) nasal spray 4 mg/0.1 mL Place 1 spray into the nose once. 10/22/22   [provider]  NOVOLOG  FLEXPEN 100 UNIT/ML FlexPen Inject 5-10 Units into the skin 3 (three) times daily with meals. Sliding scale mealtime insulin . 03/01/21   [provider]  nystatin  ointment (MYCOSTATIN ) Apply 1 Application topically 3 (three) times daily. 01/04/23   [provider]  oxyCODONE -acetaminophen  (PERCOCET/ROXICET) 5-325 MG tablet Take 1 tablet by mouth 3 (three) times daily as needed for pain. 09/07/21   [provider]  polyethylene glycol powder (GLYCOLAX /MIRALAX ) 17 GM/SCOOP powder Take by mouth.    [provider]  PULMICORT  0.5 MG/2ML nebulizer solution Take 0.5 mg by nebulization every 12 (twelve) hours. 02/13/21   [provider]  terbinafine (LAMISIL) 1 % cream Apply 1 application  topically 2 (two) times daily. To affected skin, for 1 week after resolution of rash. # 30 gm, 1 refill.    [provider]  TRULICITY 4.5 MG/0.5ML SOPN Inject 4.5 mg into the skin once a week. 09/28/21   [provider]  Vitamin  D, Ergocalciferol , (DRISDOL ) 1.25 MG (50000 UNIT) CAPS capsule Take 50,000 Units by mouth every 7 (seven) days.    [provider]  fluconazole  (DIFLUCAN ) 150 MG tablet Take 1 tablet day 1 repeat in 1 week 11/15/20   Moye, Virginia , MD  Scheduled Meds:  Chlorhexidine   Gluconate Cloth  6 each Topical Daily   clonazePAM   0.5 mg Oral TID   famotidine   20 mg Oral BID   heparin   5,000 Units Subcutaneous Q8H   insulin  aspart  0-9 Units Subcutaneous Q4H   ipratropium-albuterol   3 mL Nebulization Q6H   mirtazapine   30 mg Oral QHS   multivitamin with minerals  1 tablet Oral Daily   PARoxetine   30 mg Oral Daily   Ensure Max Protein  11 oz Oral BID   Continuous Infusions:  piperacillin -tazobactam (ZOSYN )  IV 12.5 mL/hr at 12/18/23 0645   PRN Meds:.docusate sodium , ipratropium-albuterol , labetalol , midazolam , oxyCODONE -acetaminophen , polyethylene glycol  Active Hospital Problem list   See systems below  Assessment & Plan:  #Acute on Chronic Hypoxic & Hypercapnic Respiratory failure in the setting of ... #Acute COPD Exacerbation  #Mucus Plugging PMHx: COPD/Emphysema/Asthma with chronic tracheostomy and home vent. Pseudomonas and Acinetobacter PNA  -Vent support Trilogy per home settings -titrate FiO2, PEEP to maintain O2 sat >90%  -PRN Chest X-ray & ABG    #Sepsis HAS BEEN RULED OUT -F/u cultures, trend lactic/ PCT -Monitor WBC/ fever curve -neg MRSA nasal swab, RVP, legionella, strep urine Ag -IV antibiotics Zosyn  -Gentle IVF hydration as needed -Pressors PRN for MAP goal >65 -Strict I/O's   #Anxiety/Depression -Prn Valium  -Continue Klonopin     #Type 2 Diabetes Mellitus, insulin  dependent -CBG's q4h; Target range of 140 to 180 -SSI -Follow ICU Hypo/Hyperglycemia protocol   #Hypertension Hx: Hyperlipidemia, CAD, CHF -Cardiac monitoring -MAP goal > 65, SBP < 160 -UDS neg -Continue home medications once evaluated by SLP    Best practice:  Diet:  Oral Pain/Anxiety/Delirium protocol (if indicated): No VAP protocol (if indicated): Yes DVT prophylaxis: Subcutaneous Heparin  GI prophylaxis: H2B Glucose control:  SSI Yes Central venous access:  N/A Arterial line:  N/A Foley:  N/A Mobility:  bed rest  PT consulted: N/A Last date of  multidisciplinary goals of care discussion [7/12] Code Status:  full code Disposition: ICU   = Goals of Care = Code Status Order: FULL  Primary Emergency Contact: Wicke,[name], Home Phone: [phone] Wishes to pursue full aggressive treatment and intervention options, including CPR and intubation, but goals of care will be addressed on going with family if that should become necessary.   Critical care provider statement:   Total critical care time: 33 minutes   Performed by: Parris MD   Critical care time was exclusive of separately billable procedures and treating other patients.   Critical care was necessary to treat or prevent imminent or life-threatening deterioration.   Critical care was time spent personally by me on the following activities: development of treatment plan with patient and/or surrogate as well as nursing, discussions with consultants, evaluation of patient's response to treatment, examination of patient, obtaining history from patient or surrogate, ordering and performing treatments and interventions, ordering and review of laboratory studies, ordering and review of radiographic studies, pulse oximetry and re-evaluation of patient's condition.    Harbert Fitterer, M.D.  Pulmonary & Critical Care Medicine

## 2023-12-18 NOTE — TOC Progression Note (Signed)
 Transition of Care [facility]) - Progression Note    Patient Details  Name: Michelle [name] MRN: [id number] Date of Birth: 11/21/1960  Transition of Care [facility]) CM/SW Contact  [place] L Anaka Beazer, [place] Phone Number: 12/18/2023, 4:07 PM  Clinical Narrative:     CSW spoke with the patient's daughter, [place]. CSW inquired about the availability of someone in the home to receive the hoyer lift if ordered and delivered to the home. [place] advised that patient already has a Nurse, adult but she does not use it. [place] stated that it is in the closet. CSW encouraged the use of the hoyer lift as PT is recommending that patient has it. [place] advised that she will take the hoyer lift out of the closet.   Expected Discharge Plan: Home w Home Health Services (Personal care services.) Barriers to Discharge: Continued Medical Work up  Expected Discharge Plan and Services     Post Acute Care Choice: Resumption of Svcs/PTA Provider Living arrangements for the past 2 months: Apartment                                       Social Determinants of Health (SDOH) Interventions SDOH Screenings   Food Insecurity: No Food Insecurity (12/14/2023)  Housing: Low Risk  (12/14/2023)  Transportation Needs: No Transportation Needs (12/14/2023)  Utilities: Not At Risk (12/14/2023)  Financial Resource Strain: Medium Risk (06/29/2017)  Physical Activity: Inactive (06/29/2017)  Social Connections: Unknown (06/29/2017)  Stress: Stress Concern Present (06/29/2017)  Tobacco Use: Medium Risk (11/18/2023)    Readmission Risk Interventions    12/16/2023    1:28 PM 04/19/2023    2:10 PM 02/22/2023    1:47 PM  Readmission Risk Prevention Plan  Transportation Screening Complete Complete Complete  PCP or Specialist Appt within 3-5 Days  Complete Complete  HRI or Home Care Consult  Complete Complete  Social Work Consult for Recovery Care Planning/Counseling  Complete Complete  Palliative Care Screening  Complete Complete   Medication Review Oceanographer) Complete Complete Complete  PCP or Specialist appointment within 3-5 days of discharge Complete    HRI or Home Care Consult Complete    SW Recovery Care/Counseling Consult Complete    Palliative Care Screening Not Applicable    Skilled Nursing Facility Not Applicable

## 2023-12-18 NOTE — Evaluation (Signed)
 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 Pt presented to the ED with chief complaints of increased shortness of breath and secretions, fevers. Admit for acute on chronic respiratory failure iso suspected Pneumonia, AECOPD and UTI.   Clinical Impression  Pt alert, agreeable to PT/OT with maximum encouragement. Per pt at baseline she is bedbound, but able to stand pivot to [facility] with her daughter every other day, bed pain/briefs for toileting, assistance for all ADLs (does feed herself). Pt required modAx2 to come up to sitting EOB ,and CGA-minA to maintain (unable to obtain adequate footing on ground due to short stature). Pt complained of increased anxiety and pain around her trach, declined any further mobility at this time.  Overall the patient demonstrated deficits (see PT Problem List) that impede the patient's functional abilities, safety, and mobility and would benefit from skilled PT intervention.          If plan is discharge home, recommend the following: A lot of help with walking and/or transfers;A lot of help with bathing/dressing/bathroom;Assistance with feeding;Assist for transportation;Assistance with cooking/housework;Help with stairs or ramp for entrance   Can travel by private vehicle        Equipment Recommendations Other (comment) (hoyer lift)  Recommendations for Other Services       Functional Status Assessment Patient has had a recent decline in their functional status and demonstrates the ability to make significant improvements in function in a reasonable and predictable amount  of time.     Precautions / Restrictions Precautions Precautions: Fall Recall of Precautions/Restrictions: Intact Restrictions Weight Bearing Restrictions Per Provider Order: No      Mobility  Bed Mobility Overal bed mobility: Needs Assistance Bed Mobility: Supine to Sit, Sit to Supine     Supine to sit: Mod assist, +2 for safety/equipment Sit to supine: Mod assist, +2 for safety/equipment        Transfers                   General transfer comment: pt unwilling to attempt citing 10/10 trach pain and requesting anxiety meds    Ambulation/Gait                  Stairs            Wheelchair Mobility     Tilt Bed    Modified Rankin (Stroke Patients Only)       Balance Overall balance assessment: Needs assistance Sitting-balance support: Bilateral upper extremity supported, Feet unsupported Sitting balance-Leahy Scale: Poor         Standing balance comment: pt unwilling to attempt                             Pertinent Vitals/Pain Pain Assessment Pain Assessment: 0-10 Pain Score: 10-Worst pain ever Pain Location: trach site Pain Descriptors / Indicators: Discomfort, Grimacing, Moaning Pain Intervention(s): Limited activity within patient's tolerance, Monitored during session, Repositioned    Home Living Family/patient expects to be discharged to:: Private residence Living Arrangements: Children Available Help at Discharge: Family;Available 24 hours/day Type of Home: Mobile home Home Access: Ramped entrance  Home Layout: One level Home Equipment: Wheelchair - manual      Prior Function Prior Level of Function : Needs assist             Mobility Comments: stand pivot transfer to [facility], every other day. daughter assists with bed mobility ADLs Comments: Dtr assist for bed pan, dressing. Pt self-feeds     Extremity/Trunk Assessment   Upper Extremity Assessment Upper Extremity Assessment: Generalized  weakness    Lower Extremity Assessment Lower Extremity Assessment: Generalized weakness       Communication   Communication Communication: Impaired Factors Affecting Communication: Trach/intubated    Cognition Arousal: Alert Behavior During Therapy: Anxious   PT - Cognitive impairments: No apparent impairments                       PT - Cognition Comments: pt oriented to self, able to provide some PLOF Following commands: Intact       Cueing       General Comments General comments (skin integrity, edema, etc.): SPO2 high 90s on 2L trach vent, RR 22-26 with activity    Exercises     Assessment/Plan    PT Assessment Patient needs continued PT services  PT Problem List Decreased strength;Decreased balance;Decreased mobility;Decreased activity tolerance       PT Treatment Interventions DME instruction;Balance training;Gait training;Neuromuscular re-education;Stair training;Functional mobility training;Patient/family education;Therapeutic activities;Therapeutic exercise    PT Goals (Current goals can be found in the Care Plan section)  Acute Rehab PT Goals Patient Stated Goal: to feel better PT Goal Formulation: With patient Time For Goal Achievement: 01/01/24 Potential to Achieve Goals: Good    Frequency Min 2X/week     Co-evaluation PT/OT/SLP Co-Evaluation/Treatment: Yes Reason for Co-Treatment: Necessary to address cognition/behavior during functional activity PT goals addressed during session: Mobility/safety with mobility OT goals addressed during session: ADL's and self-care       AM-PAC PT 6 Clicks Mobility  Outcome Measure Help needed turning from your back to your side while in a flat bed without using bedrails?: A Lot Help needed moving from lying on your back to sitting on the side of a flat bed without using bedrails?: A Lot Help needed moving to and from a bed to a chair (including a wheelchair)?: A Lot Help needed standing up from  a chair using your arms (e.g., wheelchair or bedside chair)?: A Lot Help needed to walk in hospital room?: Total Help needed climbing 3-5 steps with a railing? : Total 6 Click Score: 10    End of Session   Activity Tolerance: Patient limited by fatigue;Patient limited by pain Patient left: in bed;with call bell/phone within reach;with bed alarm set Nurse Communication: Mobility status;Patient requests pain meds PT Visit Diagnosis: Other abnormalities of gait and mobility (R26.89);Difficulty in walking, not elsewhere classified (R26.2);Muscle weakness (generalized) (M62.81)    Time: 8642-8587 PT Time Calculation (min) (ACUTE ONLY): 15 min   Charges:   PT Evaluation $PT Eval Moderate Complexity: 1 Mod   PT General Charges $$ ACUTE PT VISIT: 1 Visit       Doyal Shams PT, DPT 3:34 PM,12/18/23

## 2023-12-19 DIAGNOSIS — A419 Sepsis, unspecified organism: Secondary | ICD-10-CM | POA: Diagnosis not present

## 2023-12-19 DIAGNOSIS — N390 Urinary tract infection, site not specified: Secondary | ICD-10-CM | POA: Diagnosis not present

## 2023-12-19 LAB — GLUCOSE, CAPILLARY
Glucose-Capillary: 192 mg/dL — ABNORMAL HIGH (ref 70–99)
Glucose-Capillary: 181 mg/dL — ABNORMAL HIGH (ref 70–99)
Glucose-Capillary: 221 mg/dL — ABNORMAL HIGH (ref 70–99)
Glucose-Capillary: 253 mg/dL — ABNORMAL HIGH (ref 70–99)
Glucose-Capillary: 182 mg/dL — ABNORMAL HIGH (ref 70–99)
Glucose-Capillary: 193 mg/dL — ABNORMAL HIGH (ref 70–99)

## 2023-12-19 LAB — CULTURE, BLOOD (ROUTINE X 2)
Culture: NO GROWTH
Culture: NO GROWTH

## 2023-12-19 LAB — RENAL FUNCTION PANEL
Albumin: 3.3 g/dL — ABNORMAL LOW (ref 3.5–5.0)
Anion gap: 10 (ref 5–15)
BUN: 11 mg/dL (ref 8–23)
CO2: 27 mmol/L (ref 22–32)
Calcium: 9.1 mg/dL (ref 8.9–10.3)
Chloride: 106 mmol/L (ref 98–111)
Creatinine, Ser: 0.85 mg/dL (ref 0.44–1.00)
GFR, Estimated: >60 mL/min (ref >60)
Glucose, Bld: 174 mg/dL — ABNORMAL HIGH (ref 70–99)
Phosphorus: 3.5 mg/dL (ref 2.5–4.6)
Potassium: 4 mmol/L (ref 3.5–5.1)
Sodium: 143 mmol/L (ref 135–145)

## 2023-12-19 LAB — CBC
HCT: 37.3 % (ref 36.0–46.0)
Hemoglobin: 11.2 g/dL — ABNORMAL LOW (ref 12.0–15.0)
MCH: 25.5 pg — ABNORMAL LOW (ref 26.0–34.0)
MCHC: 30 g/dL (ref 30.0–36.0)
MCV: 84.8 fL (ref 80.0–100.0)
Platelets: 333 K/uL (ref 150–400)
RBC: 4.4 MIL/uL (ref 3.87–5.11)
RDW: 13.8 % (ref 11.5–15.5)
WBC: 7.3 K/uL (ref 4.0–10.5)
nRBC: 0 % (ref 0.0–0.2)

## 2023-12-19 LAB — MAGNESIUM: Magnesium: 1.9 mg/dL (ref 1.7–2.4)

## 2023-12-19 LAB — ACID FAST SMEAR (AFB, MYCOBACTERIA): Acid Fast Smear: NEGATIVE

## 2023-12-19 MED ORDER — ENOXAPARIN SODIUM 40 MG/0.4ML IJ SOSY
40.0000 mg | PREFILLED_SYRINGE | INTRAMUSCULAR | Status: DC
  Administered 2023-12-19: 40 mg via SUBCUTANEOUS
  Filled 2023-12-19: qty 0.4

## 2023-12-19 MED ORDER — MORPHINE SULFATE (PF) 2 MG/ML IV SOLN
1.0000 mg | Freq: Once | INTRAVENOUS | Status: AC
  Administered 2023-12-19: 1 mg via INTRAVENOUS
  Filled 2023-12-19: qty 1

## 2023-12-19 MED ORDER — INSULIN GLARGINE-YFGN 100 UNIT/ML ~~LOC~~ SOLN
20.0000 [IU] | Freq: Every day | SUBCUTANEOUS | Status: DC
  Administered 2023-12-19: 20 [IU] via SUBCUTANEOUS
  Filled 2023-12-19 (×2): qty 0.2

## 2023-12-19 MED ORDER — INSULIN ASPART 100 UNIT/ML IJ SOLN
0.0000 [IU] | Freq: Three times a day (TID) | INTRAMUSCULAR | Status: DC
  Administered 2023-12-20: 2 [IU] via SUBCUTANEOUS
  Administered 2023-12-20 (×2): 3 [IU] via SUBCUTANEOUS
  Filled 2023-12-19: qty 3
  Filled 2023-12-19: qty 2
  Filled 2023-12-19: qty 3

## 2023-12-19 NOTE — Consult Note (Signed)
 PHARMACY CONSULT NOTE - ELECTROLYTES  Pharmacy Consult for Electrolyte Monitoring and Replacement   Recent Labs: Height: 4' 10 (147.3 cm) Weight: 73.9 kg (162 lb 14.7 oz) IBW/kg (Calculated) : 40.9 Estimated Creatinine Clearance: 58.6 mL/min (by C-G formula based on SCr of 0.85 mg/dL). Potassium (mmol/L)  Date Value  12/19/2023 4.0  07/11/2014 4.6   Magnesium  (mg/dL)  Date Value  92/82/7974 1.9  10/31/2013 2.6 (H)   Calcium  (mg/dL)  Date Value  92/82/7974 9.1   Calcium , Total (mg/dL)  Date Value  97/92/7983 8.9   Albumin  (g/dL)  Date Value  92/82/7974 3.3 (L)  07/09/2014 3.7   Phosphorus (mg/dL)  Date Value  92/82/7974 3.5  10/31/2013 4.6   Sodium (mmol/L)  Date Value  12/19/2023 143  07/11/2014 139    Assessment  Michelle [name] is a 63 y.o. female presenting with shortness of breath, fever, and increased secretions. PMH significant for COPD, CHF, hypertension, hyperlipidemia, chronic respiratory failure status post tracheostomy on ventilator chronically. Pharmacy has been consulted to monitor and replace electrolytes.  Pertinent medications: N/A  Goal of Therapy: Electrolytes WNL  Plan:  No electrolyte replacement warranted for today Because this consult was generated as part of an ICU order set and patient is transferring pharmacy will sign off for now Please feel free to reach out if any further assistance is needed  Thank you for allowing pharmacy to be a part of this patient's care.  [name], PharmD Clinical Pharmacist 12/19/2023 7:09 AM

## 2023-12-19 NOTE — Progress Notes (Signed)
   12/19/23 1100  Vitals  Pulse Rate (!) 127  ECG Heart Rate (!) 126  Resp (!) 29  Oxygen Therapy  SpO2 (!) 76 %   Pt became SOB with tranfer on and off bedpan. Ventilator O2 increased to 6 L and prn versed  for anxiety administered. Oxygen levels have improved to 97% and HR 113. RR now 21. Pt appears more relaxed. Weaned back to 2 L on vent. Will continue to monitor.

## 2023-12-19 NOTE — Progress Notes (Signed)
 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  She was prescribed antibiotics which she reports she did not complete.   ED Course: Initial vital signs showed HR of 116 beats/minute, BP mm Hg, the RR 29 breaths/minute, and the oxygen saturation 98% on TC and a temperature of 100.35F (38.2C).  Pertinent Labs/Diagnostics Findings: Glucose:195 otherwise unremarkable CMP WBC:11.4 K/L  COVID PCR: Negative Chest x-ray shows possible infiltrate versus atelectasis in the right lower lobe. CT scans obtained which shows pneumonia in the right lower lung  Patient given 30 cc/kg of fluids and started on broad-spectrum antibiotics Vanco cefepime  and Flagyl  for suspected sepsis with septic shock.  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  Labs/bloodwork stable , mild hypomag repleted, Remains on zosyn .  Trache site clean/ #6 Shiley tracheostomy, mentation lucid with mild anxiety required PRN anxiolytic, UOP 600 via [place], able to eat/have normal bm.  Plan for possible bronchoscopy today due to mucus plugging.  12/17/23- no overnight events, Shiley 6 exchanged, still coughing up thick mucus with difficulty expectorating.  Plan for bronchoscopy with therapeutic aspiration and BAL for microbiologic workup 12/18/23- patient improved, vitals are improved. Mucus plugging is much improved. Will optimize for [facility] transfer and initiate PT/OT 12/19/23- patient improved now on 2L/min on home NIV, reduced cough with less phlegm on expectoration.  [facility] has assumed care of patient now PCCM will sign off.      Micro Data:  7/12: SARS-CoV-2 PCR> negative 7/12: Influenza PCR> negative 7/12: Blood culture x2>no growth 7/12: MRSA PCR>> neg 7/12: Strep pneumo urinary antigen>neg 7/12: Legionella urinary antigen>  Antimicrobials:  Vancomycin  7/12x1 Cefepime  7/12 x1 Metronidazole  7/12 x1 Zosyn  7/13>>  OBJECTIVE  Blood pressure (!) 115/59, pulse 98, temperature 98.4 F (36.9 C), temperature source Oral, resp. rate 18, height 4' 10 (1.473 m), weight 73.9 kg, last menstrual period 03/06/2005, SpO2 97%.    Vent Mode: SIMV FiO2 (%):  [28 %] 28 % Set Rate:  [16 bmp] 16 bmp Vt Set:  [400 mL] 400 mL PEEP:  [5 cmH20] 5 cmH20 Pressure Support:  [10 cmH20] 10 cmH20   Intake/Output Summary (Last 24 hours) at 12/19/2023 0823 Last data filed at 12/19/2023 0400 Gross per 24 hour  Intake 38.72 ml  Output 1500 ml  Net -1461.28 ml   [name]  Weights   12/16/23 0339 12/17/23 0500 12/18/23 0318  Weight: 71.8 kg 72.9 kg 73.9 kg   Physical Examination  GENERAL: Normal appearance. She is obese.  HEENT:     Head: Normocephalic  and atraumatic.     Right Ear: External ear normal.     Left Ear: External ear normal.     Nose: Nose normal.     Mouth/Throat:     Mouth: Mucous membranes are moist.     Extraocular Movements: Extraocular movements intact.     Pupils: Pupils are equal, round, and reactive to light.  CARDIOVASCULAR:S1 S2, no murmurs Intermittently tachycardic PULMONARY: Trach and Ventilator, Moderate secretions, Lungs course with wheezes ABDOMINAL: NTND +BS slightly distended.  MUSCULOSKELETAL: No swelling. Normal range of motion. 4/5 strength in all extremities.   SKIN:Skin is warm and dry. Capillary Refill: Capillary refill takes less than 2 seconds.  NEUROLOGICAL: General: No focal deficit present. She is alert and oriented to person, place, and time baseline.  PSYCHIATRIC:  Mood and Affect: Mood normal.  Labs/imaging that I havepersonally reviewed  (right click and Reselect all SmartList Selections daily)   DG Chest Port 1 View Result Date: 12/18/2023 CLINICAL DATA:  Atelectasis EXAM: PORTABLE CHEST 1 VIEW COMPARISON:  December 17, 2023 FINDINGS: Tracheostomy tube in place. Right greater than left streaky bibasilar opacities with slightly improved aeration of the right lung base compared to prior. No new consolidations. No pneumothorax. Unchanged cardiomediastinal silhouette. IMPRESSION: Slightly improved aeration of the right lung base with persistent streaky right greater than left bibasilar opacities, favored to represent atelectasis, although aspiration/infection may have similar appearance. Electronically Signed   By: Michaeline Blanch M.D.   On: 12/18/2023 11:07   DG Chest Port 1 View Result Date: 12/17/2023 EXAM: 1 VIEW XRAY OF THE CHEST 12/17/2023 08:27:35 PM COMPARISON: Radiograph 12/14/2023 and CT 12/14/2023. CLINICAL HISTORY: Status post bronchoscopy. FINDINGS: LUNGS AND PLEURA: Patchy basilar airspace opacities may be due to atelectasis, aspiration, or pneumonia. Small bilateral pleural effusions. No  pneumothorax. HEART AND MEDIASTINUM: Stable cardiomediastinal silhouette. BONES AND SOFT TISSUES: No acute osseous abnormality. IMPRESSION: 1. Patchy basilar airspace opacities, possibly due to atelectasis, aspiration, or pneumonia. 2. Small bilateral pleural effusions. 3. No pneumothorax. Electronically signed by: Norman Gatlin MD 12/17/2023 08:45 PM EDT RP Workstation: [id number]     Labs   CBC: Recent Labs  Lab 12/13/23 2245 12/14/23 0654 12/15/23 0411 12/16/23 0452 12/17/23 0241 12/18/23 0912 12/19/23 0310  WBC 11.4*   < > 7.5 5.1 7.0 7.8 7.3  NEUTROABS 8.2*  --   --   --   --   --   --   HGB 11.8*   < > 10.2* 10.4* 11.5* 11.3* 11.2*  HCT 37.9   < > 32.6* 33.4* 37.7 37.2 37.3  MCV 84.4   < > 82.5 84.1 83.8 84.7 84.8  PLT 262   < > 247 276 328 303 333   < > = values in this interval not displayed.   Basic Metabolic Panel: Recent Labs  Lab 12/15/23 0411 12/16/23 0452 12/17/23 0241 12/18/23 0523 12/19/23 0310  NA 140 144 142 142 143  K 3.9 4.0 3.5 4.1 4.0  CL 108 106 108 109 106  CO2 24 28 28 25 27   GLUCOSE 115* 207* 159* 138* 174*  BUN 12 10 10 10 11   CREATININE 0.72 0.79 0.76 0.78 0.85  CALCIUM  8.5* 8.7* 8.9 8.8* 9.1  MG 1.9 2.1 2.2 2.1 1.9  PHOS  --  2.5  --  3.4 3.5   GFR: Estimated Creatinine Clearance: 58.6 mL/min (by C-G formula based on SCr of 0.85 mg/dL). Recent Labs  Lab 12/13/23 2245 12/14/23 0654 12/15/23 0411 12/16/23 0452 12/17/23 0241 12/18/23 0912 12/19/23 0310  PROCALCITON  --  0.30  --   --   --   --   --   WBC 11.4* 8.5   < > 5.1 7.0 7.8 7.3  LATICACIDVEN 1.1  --   --   --   --   --   --    < > = values in this interval not displayed.    Liver Function Tests: Recent Labs  Lab 12/13/23 2245 12/19/23 0310  AST 17  --   ALT 12  --   ALKPHOS 92  --   BILITOT 0.7  --   PROT 7.9  --   ALBUMIN  3.5 3.3*   No results for input(s): LIPASE, AMYLASE in the last 168 hours. No results for input(s): AMMONIA in the last 168  hours.  ABG    Component Value Date/Time   PHART 7.31 (L) 09/30/2021 0500   PCO2ART 46 09/30/2021 0500   PO2ART 155 (H) 09/30/2021 0500   HCO3 24.8 08/29/2023 0307   ACIDBASEDEF 1.5 08/29/2023 0307   O2SAT 97.5 08/29/2023 0307     Coagulation Profile: Recent Labs  Lab 12/13/23 2245  INR 1.0   Cardiac Enzymes: No results for input(s): CKTOTAL, CKMB, CKMBINDEX, TROPONINI in the last 168 hours.  HbA1C: Hemoglobin A1C  Date/Time Value Ref Range Status  06/29/2013 04:02 AM 6.0 4.2 - 6.3 % Final    Comment:    The American Diabetes Association recommends that a primary goal of therapy should be <7% and that physicians should reevaluate the treatment regimen in patients with HbA1c values consistently >8%.    Hgb A1c MFr Bld  Date/Time Value Ref Range Status  08/29/2023 01:01 AM 9.4 (H) 4.8 - 5.6 % Final    Comment:    (NOTE) Pre diabetes:          5.7%-6.4%  Diabetes:              >6.4%  Glycemic control for   <7.0% adults with diabetes   02/19/2023 04:33 AM 10.3 (H) 4.8 - 5.6 % Final    Comment:    (NOTE)         Prediabetes: 5.7 - 6.4         Diabetes: >6.4         Glycemic control for adults with diabetes: <7.0    CBG: Recent Labs  Lab 12/18/23 1116 12/18/23 1610 12/18/23 1932 12/18/23 2337 12/19/23 0354  GLUCAP 186* 194* 171* 197* 192*   Review of Systems:   Unable to be obtained secondary to the patient is on trach collar  Past Medical History  She,  has a past medical history of Allergy, Anxiety, Asthma, CHF (congestive heart failure) (HCC), Cocaine abuse (HCC), COPD (chronic obstructive pulmonary disease) (HCC), Coronary artery disease, Emphysema/COPD (HCC), Hepatitis C, High cholesterol, Hypertension, Pneumothorax, and Tobacco abuse.   Surgical History    Past Surgical History:  Procedure Laterality Date   CARDIAC CATHETERIZATION     CHEST TUBE INSERTION     IR GASTROSTOMY TUBE MOD SED  10/31/2017   TRACHEOSTOMY TUBE PLACEMENT N/A  10/17/2017   Procedure: TRACHEOSTOMY;  Surgeon: Milissa Hamming, MD;  Location: [facility] ORS;  Service: ENT;  Laterality: N/A;     Social History   reports that she quit smoking about  6 years ago. Her smoking use included cigarettes. She started smoking about 47 years ago. She has a 4.1 pack-year smoking history. She has never used smokeless tobacco. She reports that she does not currently use drugs after having used the following drugs: Crack cocaine. She reports that she does not drink alcohol.   Family History   Her family history includes Asthma in her mother; Breast cancer (age of onset: 80) in her maternal aunt; CAD in her father; COPD in her sister; CVA in her father; Lung cancer in her mother; Stroke in her father.   Allergies Allergies  Allergen Reactions   Lorazepam      Makes anxiety worse  Other Reaction(s): Makes anxiety worse   Lisinopril  Cough   Amoxicillin      Patient has tolerated cephalosporins in the past   Home Medications  Prior to Admission medications   Medication Sig Start Date End Date Taking? Authorizing Provider  acetaminophen  (TYLENOL ) 325 MG tablet Take 2 tablets (650 mg total) by mouth every 6 (six) hours as needed for mild pain (pain score 1-3) (or Fever >/= 101). 04/24/23   Dorinda Drue DASEN, MD  albuterol  (PROVENTIL ) (2.5 MG/3ML) 0.083% nebulizer solution Take 3 mLs (2.5 mg total) by nebulization every 6 (six) hours as needed for wheezing or shortness of breath. 03/27/23   Tamea Dedra CROME, MD  albuterol  (VENTOLIN  HFA) 108 (90 Base) MCG/ACT inhaler Inhale 2 puffs into the lungs every 6 (six) hours as needed for wheezing or shortness of breath. 04/02/22   Parrett, Madelin RAMAN, NP  amLODipine  (NORVASC ) 5 MG tablet Take 5 mg by mouth daily. 08/27/21   [provider]  aspirin  81 MG chewable tablet Chew 81 mg by mouth daily.     [provider]  azelastine  (ASTELIN ) 0.1 % nasal spray SMARTSIG:1-2 Spray(s) Both Nares Every 12 Hours PRN 09/22/20    [provider]  Blood Glucose Monitoring Suppl ([employer] BLOOD GLUCOSE METER) w/Device KIT Use as directed to monitor blood glucose up to qid 02/22/23   Alexander, Natalie, DO  cetirizine  (ZYRTEC ) 10 MG tablet Take 10 mg by mouth daily. 03/21/22   [provider]  clonazePAM  (KLONOPIN ) 0.5 MG tablet Take 0.5 mg by mouth in the morning, at noon, and at bedtime.    [provider]  cloNIDine  (CATAPRES  - DOSED IN MG/24 HR) 0.1 mg/24hr patch Place 0.1 mg onto the skin once a week.    [provider]  docusate sodium  (COLACE) 100 MG capsule Take 2 capsules (200 mg total) by mouth daily as needed for mild constipation. 04/24/23   Dorinda Drue DASEN, MD  doxycycline  (MONODOX ) 100 MG capsule Take 1 capsule (100 mg total) by mouth 2 (two) times daily. Take with food and drink 11/18/23   Claudene Lehmann, MD  fluticasone  (FLONASE ) 50 MCG/ACT nasal spray Place 1 spray into both nostrils daily.    [provider]  guaiFENesin  (MUCINEX ) 600 MG 12 hr tablet Take 600 mg by mouth 2 (two) times daily.    [provider]  hydrOXYzine  (VISTARIL ) 25 MG capsule Take 25 mg by mouth every 6 (six) hours as needed. 10/01/22   [provider]  ipratropium-albuterol  (DUONEB) 0.5-2.5 (3) MG/3ML SOLN Take 3 mLs by nebulization every 6 (six) hours as needed (for shortness of breath). 06/17/23   Kasa, Kurian, MD  LANTUS  SOLOSTAR 100 UNIT/ML Solostar Pen Inject 40 Units into the skin at bedtime. 01/03/23   [provider]  linezolid  (ZYVOX ) 600 MG tablet Take 1  tablet (600 mg total) by mouth every 12 (twelve) hours. 09/05/23   Nelson, Dana G, NP  naloxone [facility]) nasal spray 4 mg/0.1 mL Place 1 spray into the nose once. 10/22/22   [provider]  NOVOLOG  FLEXPEN 100 UNIT/ML FlexPen Inject 5-10 Units into the skin 3 (three) times daily with meals. Sliding scale mealtime insulin . 03/01/21   [provider]  nystatin  ointment (MYCOSTATIN ) Apply 1 Application  topically 3 (three) times daily. 01/04/23   [provider]  oxyCODONE -acetaminophen  (PERCOCET/ROXICET) 5-325 MG tablet Take 1 tablet by mouth 3 (three) times daily as needed for pain. 09/07/21   [provider]  polyethylene glycol powder (GLYCOLAX /MIRALAX ) 17 GM/SCOOP powder Take by mouth.    [provider]  PULMICORT  0.5 MG/2ML nebulizer solution Take 0.5 mg by nebulization every 12 (twelve) hours. 02/13/21   [provider]  terbinafine (LAMISIL) 1 % cream Apply 1 application  topically 2 (two) times daily. To affected skin, for 1 week after resolution of rash. # 30 gm, 1 refill.    [provider]  TRULICITY 4.5 MG/0.5ML SOPN Inject 4.5 mg into the skin once a week. 09/28/21   [provider]  Vitamin D , Ergocalciferol , (DRISDOL ) 1.25 MG (50000 UNIT) CAPS capsule Take 50,000 Units by mouth every 7 (seven) days.    [provider]  fluconazole  (DIFLUCAN ) 150 MG tablet Take 1 tablet day 1 repeat in 1 week 11/15/20   Moye, Virginia , MD  Scheduled Meds:  amoxicillin -clavulanate  1 tablet Oral Q12H   Chlorhexidine  Gluconate Cloth  6 each Topical Daily   clonazePAM   0.5 mg Oral TID   famotidine   20 mg Oral BID   heparin   5,000 Units Subcutaneous Q8H   insulin  aspart  0-9 Units Subcutaneous Q4H   ipratropium-albuterol   3 mL Nebulization Q6H   mirtazapine   30 mg Oral QHS   multivitamin with minerals  1 tablet Oral Daily   PARoxetine   30 mg Oral Daily   Ensure Max Protein  11 oz Oral BID   Continuous Infusions:   PRN Meds:.docusate sodium , ipratropium-albuterol , labetalol , midazolam , oxyCODONE -acetaminophen , polyethylene glycol    Assessment & Plan:  #Acute on Chronic Hypoxic & Hypercapnic Respiratory failure in the setting of ... #Acute COPD Exacerbation  #Mucus Plugging PMHx: COPD/Emphysema/Asthma with chronic tracheostomy and home vent. Pseudomonas and Acinetobacter PNA  -Vent support Trilogy per home settings -titrate FiO2,  PEEP to maintain O2 sat >90%  -PRN Chest X-ray & ABG    #Sepsis HAS BEEN RULED OUT -F/u cultures, trend lactic/ PCT -Monitor WBC/ fever curve -neg MRSA nasal swab, RVP, legionella, strep urine Ag -IV antibiotics Zosyn  -Gentle IVF hydration as needed -Pressors PRN for MAP goal >65 -Strict I/O's   #Anxiety/Depression -Prn Valium  -Continue Klonopin     #Type 2 Diabetes Mellitus, insulin  dependent -CBG's q4h; Target range of 140 to 180 -SSI -Follow ICU Hypo/Hyperglycemia protocol   #Hypertension Hx: Hyperlipidemia, CAD, CHF -Cardiac monitoring -MAP goal > 65, SBP < 160 -UDS neg -Continue home medications once evaluated by SLP    Best practice:  Diet:  Oral Pain/Anxiety/Delirium protocol (if indicated): No VAP protocol (if indicated): Yes DVT prophylaxis: Subcutaneous Heparin  GI prophylaxis: H2B Glucose control:  SSI Yes Central venous access:  N/A Arterial line:  N/A Foley:  N/A Mobility:  bed rest  PT consulted: N/A Last date of multidisciplinary goals of care discussion [7/12] Code Status:  full code Disposition: ICU   = Goals of Care = Code Status Order: FULL  Primary Emergency  Contact: Roehrich,[name], Home Phone: [phone] Wishes to pursue full aggressive treatment and intervention options, including CPR and intubation, but goals of care will be addressed on going with family if that should become necessary.     Mckaylee Dimalanta, M.D.  Pulmonary & Critical Care Medicine

## 2023-12-19 NOTE — Progress Notes (Signed)
 Pt continued to have increased WOB despite prn versed . Asking for more medication for her anxiety/ increased WOB. MD notified and new orders received.

## 2023-12-19 NOTE — Progress Notes (Signed)
  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  She was prescribed antibiotics which she reports she did not complete.  Pt was transferred to [facility] on 7/17.   #Acute on Chronic Hypoxic & Hypercapnic Respiratory failure  #Mucus Plugging --received bronchoscopy with therapeutic aspiration and BAL  -Vent support Trilogy per home settings -titrate FiO2, PEEP to maintain O2 sat >90%  --zosyn -->Augmentin  for empiric tx    #Acute COPD Exacerbation, ruled out --pt was not started on steroid  --cont DuoNeb scheduled   #Sepsis HAS BEEN RULED OUT  #Anxiety/Depression --cont home Klonopin    #Type 2 Diabetes Mellitus, insulin  dependent With Hyperglycemia  --resume home Lantus  as glargine 20u nightly --ACHS and SSI   #Hypertension Hx: Hyperlipidemia, CAD, CHF   DVT prophylaxis: Lovenox  SQ Code Status: Full code  Family Communication:  Level of care: Stepdown Dispo:   The patient is from: home Anticipated d/c is to: home Anticipated d/c date is: tomorrow   Subjective and Interval History:  Pt said this morning she was at her baseline and ready to go home.  RN reported pt had a panic episode when getting on and off bedpan.     Objective: Vitals:   12/19/23 1206 12/19/23 1352 12/19/23  1526 12/19/23 1600  BP: (!) 152/89   106/64  Pulse: (!) 121   97  Resp: 20   15  Temp:    98 F (36.7 C)  TempSrc:    Oral  SpO2: 97% 99% 100% 100%  Weight:      Height:        Intake/Output Summary (Last 24 hours) at 12/19/2023 1738 Last data filed at 12/19/2023 1511 Gross per 24 hour  Intake 0 ml  Output 1600 ml  Net -1600 ml   Filed Weights   12/16/23 0339 12/17/23 0500 12/18/23 0318  Weight: 71.8 kg 72.9 kg 73.9 kg    Examination:   Constitutional: NAD, AAOx3 HEENT: conjunctivae and lids normal, EOMI CV: No cyanosis.   RESP: trach connected to vent Neuro: II - XII grossly intact.   Psych: subdued mood and affect.     Data Reviewed: I have personally reviewed labs and imaging studies  Time spent: 50 minutes  Ellouise Haber, MD [facility] If 7PM-7AM, please contact night-coverage 12/19/2023, 5:38 PM

## 2023-12-20 DIAGNOSIS — N390 Urinary tract infection, site not specified: Secondary | ICD-10-CM | POA: Diagnosis not present

## 2023-12-20 DIAGNOSIS — A419 Sepsis, unspecified organism: Secondary | ICD-10-CM | POA: Diagnosis not present

## 2023-12-20 LAB — CULTURE, BAL-QUANTITATIVE W GRAM STAIN: Culture: >=100000 — AB

## 2023-12-20 LAB — GLUCOSE, CAPILLARY
Glucose-Capillary: 229 mg/dL — ABNORMAL HIGH (ref 70–99)
Glucose-Capillary: 219 mg/dL — ABNORMAL HIGH (ref 70–99)
Glucose-Capillary: 185 mg/dL — ABNORMAL HIGH (ref 70–99)

## 2023-12-20 LAB — ASPERGILLUS ANTIGEN, BAL/SERUM: Aspergillus Ag, BAL/Serum: 0.6 {index} — ABNORMAL HIGH (ref 0.00–0.49)

## 2023-12-20 MED ORDER — CETIRIZINE HCL 10 MG PO TABS: 10.0000 mg | ORAL_TABLET | Freq: Every day | ORAL | Status: AC | PRN

## 2023-12-20 MED ORDER — MORPHINE SULFATE (PF) 2 MG/ML IV SOLN
1.0000 mg | Freq: Once | INTRAVENOUS | Status: AC
  Administered 2023-12-20: 1 mg via INTRAVENOUS

## 2023-12-20 MED ORDER — POLYETHYLENE GLYCOL 3350 17 GM/SCOOP PO POWD: 17.0000 g | Freq: Every day | ORAL | Status: AC | PRN

## 2023-12-20 MED ORDER — NYSTATIN 100000 UNIT/GM EX OINT: 1.0000 | TOPICAL_OINTMENT | Freq: Three times a day (TID) | CUTANEOUS | Status: AC | PRN

## 2023-12-20 MED ORDER — ADULT MULTIVITAMIN W/MINERALS CH: 1.0000 | ORAL_TABLET | Freq: Every day | ORAL | Status: AC

## 2023-12-20 MED ORDER — NALOXONE HCL 4 MG/0.1ML NA LIQD: 1.0000 | Freq: Once | NASAL | Status: AC | PRN

## 2023-12-20 MED ORDER — MORPHINE SULFATE (PF) 2 MG/ML IV SOLN
INTRAVENOUS | Status: AC
  Filled 2023-12-20: qty 1

## 2023-12-20 MED ORDER — FLUTICASONE PROPIONATE 50 MCG/ACT NA SUSP: 1.0000 | Freq: Every day | NASAL | Status: AC | PRN

## 2023-12-20 NOTE — TOC Transition Note (Signed)
 Transition of Care [facility]) - Discharge Note   Patient Details  Name: Michelle [name] MRN: [id number] Date of Birth: 10-12-1960  [facility]) CM/SW Contact:  [place] L Jerrian Mells, LCSW Phone Number: 12/20/2023, 1:34 PM   Clinical Narrative:     CSW scheduled transportation [phone]. CSW spoke with [place]. CSW advised that patient is ready for transport but CSW advised that transport may not occur until 4:30pm. Ref #[id number].   No further TOC needs identified. TOC signing off.     Barriers to Discharge: Continued Medical Work up   Patient Goals and CMS Choice            Discharge Placement                       Discharge Plan and Services Additional resources added to the After Visit Summary for       Post Acute Care Choice: Resumption of Svcs/PTA Provider                               Social Drivers of Health (SDOH) Interventions SDOH Screenings   Food Insecurity: No Food Insecurity (12/14/2023)  Housing: Low Risk  (12/14/2023)  Transportation Needs: No Transportation Needs (12/14/2023)  Utilities: Not At Risk (12/14/2023)  Financial Resource Strain: Medium Risk (06/29/2017)  Physical Activity: Inactive (06/29/2017)  Social Connections: Unknown (06/29/2017)  Stress: Stress Concern Present (06/29/2017)  Tobacco Use: Medium Risk (11/18/2023)     Readmission Risk Interventions    12/16/2023    1:28 PM 04/19/2023    2:10 PM 02/22/2023    1:47 PM  Readmission Risk Prevention Plan  Transportation Screening Complete Complete Complete  PCP or Specialist Appt within 3-5 Days  Complete Complete  HRI or Home Care Consult  Complete Complete  Social Work Consult for Recovery Care Planning/Counseling  Complete Complete  Palliative Care Screening  Complete Complete  Medication Review [employer]) Complete Complete Complete  PCP or Specialist appointment within 3-5 days of discharge Complete    HRI or Home Care Consult Complete    SW Recovery  Care/Counseling Consult Complete    Palliative Care Screening Not Applicable    Skilled Nursing Facility Not Applicable

## 2023-12-20 NOTE — Progress Notes (Signed)
 [facility] Nurse Liaison Note  I received a call back from patient's daughter, [place].  She states she thinks she is confused and not quite understanding the difference between hospice vs. Palliative care.   She notifies me that patient used to be open to our outpatient palliative care services and this is what she would like to proceed with at this time and not hospice.  Hospital medical team notified.  Referral changed to outpatient palliative referral.  Saddie HILARIO Na, RN Nurse Liaison [phone]

## 2023-12-20 NOTE — Discharge Summary (Signed)
 Physician Discharge Summary   Michelle [name]  female DOB: 1961-03-03  FMW:[id number]  PCP: Adina Buel HERO, MD  Admit date: 12/13/2023 Discharge date: 12/20/2023  Admitted From: home Disposition:  home Home Health: Yes CODE STATUS: Full code  Discharge Instructions     No wound care   Complete by: As directed       Hospital Course:  For full details, please see H&P, progress notes, consult notes and ancillary notes.  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  Pt said she was back to her baseline and asked to go home on 7/18.   #Acute on Chronic Hypoxic & Hypercapnic Respiratory failure  #Mucus Plugging --received bronchoscopy with therapeutic aspiration and BAL on 7/15 --pt received 4 days of zosyn  f/b 3 days of Augmentin  for empiric tx. --prior to discharge, pt was back at her home vent setting and PCCM signed off.  Pt needs frequent suctioning which pt said her daughter has been doing at home.   --pt's respiratory status is tenuous, and pt became distressed with minimum exertion.  Family caregivers are aware, and requested outpatient palliative care following after discharge.   #Acute COPD Exacerbation, ruled out --pt was not started on steroid by PCCM. --cont DuoNeb and Pulmicort  nebs   #Sepsis HAS BEEN RULED OUT   #Anxiety/Depression --cont home Klonopin     #Type 2 Diabetes Mellitus, insulin  dependent With Hyperglycemia  --discharged back on home regimen as below   #Hypertension --cont home  clonidine  patch   Discharge Diagnoses:  Principal Problem:   Sepsis due to urinary tract infection (HCC)   30 Day Unplanned Readmission Risk Score    Flowsheet Row ED to Hosp-Admission (Current) from 12/13/2023 in [facility] ICU/CCU  30 Day Unplanned Readmission Risk Score (%) 29.64 Filed at 12/20/2023 0801    This score is the patient's risk of an unplanned readmission within 30 days of being discharged (0 -100%). The score is based on dignosis, age, lab data, medications, orders, and past utilization.   Low:  0-14.9   Medium: 15-21.9   High: 22-29.9   Extreme: 30 and above         Discharge Instructions:  Allergies as of 12/20/2023       Reactions   Lorazepam     Makes anxiety worse Other Reaction(s): Makes anxiety worse   Lisinopril  Cough   Amoxicillin     Patient has tolerated cephalosporins in the past        Medication List     STOP taking these medications    linezolid  600 MG tablet Commonly known as: ZYVOX        TAKE these medications    acetaminophen  325 MG tablet Commonly known as: TYLENOL  Take 2 tablets (650 mg total) by mouth every 6 (six) hours as needed for mild pain (pain score 1-3) (or Fever >/= 101).   albuterol  108 (90 Base) MCG/ACT inhaler Commonly known as: VENTOLIN  HFA Inhale 2 puffs into the lungs every 6 (six) hours as needed for wheezing or shortness of breath.  albuterol  (2.5 MG/3ML) 0.083% nebulizer solution Commonly known as: PROVENTIL  Take 3 mLs (2.5 mg total) by nebulization every 6 (six) hours as needed for wheezing or shortness of breath.   amLODipine  5 MG tablet Commonly known as: NORVASC  Take 5 mg by mouth daily.   aspirin  81 MG chewable tablet Chew 81 mg by mouth daily.   azelastine  0.1 % nasal spray Commonly known as: ASTELIN  SMARTSIG:1-2 Spray(s) Both Nares Every 12 Hours PRN   cetirizine  10 MG tablet Commonly known as: ZYRTEC  Take 1 tablet (10 mg total) by mouth daily as needed for  allergies. Home med. What changed:  when to take this reasons to take this additional instructions   clonazePAM  0.5 MG tablet Commonly known as: KLONOPIN  Take 0.5 mg by mouth in the morning, at noon, and at bedtime.   cloNIDine  0.1 mg/24hr patch Commonly known as: CATAPRES  - Dosed in mg/24 hr Place 0.1 mg onto the skin once a week.   [employer] Blood Glucose Meter w/Device Kit Use as directed to monitor blood glucose up to qid   docusate sodium  100 MG capsule Commonly known as: COLACE Take 2 capsules (200 mg total) by mouth daily as needed for mild constipation.   famotidine  20 MG tablet Commonly known as: PEPCID  Take 1 tablet by mouth daily as needed for heartburn   fluticasone  50 MCG/ACT nasal spray Commonly known as: FLONASE  Place 1 spray into both nostrils daily as needed for allergies or rhinitis. Home med. What changed:  when to take this reasons to take this additional instructions   guaiFENesin  600 MG 12 hr tablet Commonly known as: MUCINEX  Take 600 mg by mouth 2 (two) times daily.   hydrOXYzine  25 MG capsule Commonly known as: VISTARIL  Take 25 mg by mouth every 6 (six) hours as needed.   ipratropium-albuterol  0.5-2.5 (3) MG/3ML Soln Commonly known as: DUONEB Take 3 mLs by nebulization every 6 (six) hours as needed (for shortness of breath).   Lantus  SoloStar 100 UNIT/ML Solostar Pen Generic drug: insulin  glargine Inject 40 Units into the skin at bedtime.   mirtazapine  30 MG tablet Commonly known as: REMERON  Take 1 tablet by mouth every night at bedtime   multivitamin with minerals Tabs tablet Take 1 tablet by mouth daily.   naloxone  4 MG/0.1ML Liqd nasal spray kit Commonly known as: NARCAN  Place 1 spray into the nose once as needed for up to 1 dose. Home med. What changed:  when to take this reasons to take this additional instructions   NovoLOG  FlexPen 100 UNIT/ML FlexPen Generic drug: insulin  aspart Inject 5-10 Units into the skin 3 (three) times  daily with meals. Sliding scale mealtime insulin .   nystatin  ointment Commonly known as: MYCOSTATIN  Apply 1 Application topically 3 (three) times daily as needed. Home med. What changed:  when to take this reasons to take this additional instructions   oxyCODONE -acetaminophen  5-325 MG tablet Commonly known as: PERCOCET/ROXICET Take 1 tablet by mouth 3 (three) times daily as needed for pain.   PARoxetine  30 MG tablet Commonly known as: PAXIL  Take 30 mg by mouth daily.   polyethylene glycol powder 17 GM/SCOOP powder Commonly known as: GLYCOLAX /MIRALAX  Take 17 g by mouth daily as needed. Home med. What changed:  how much to take when to take this reasons to take this additional instructions   Pulmicort  0.5 MG/2ML nebulizer solution Generic drug: budesonide  Take 0.5 mg by nebulization every 12 (twelve) hours.   terbinafine 1 % cream Commonly known as: LAMISIL Apply 1 application  topically 2 (two) times daily. To affected skin, for 1 week after resolution of rash. # 30 gm, 1 refill.   Trulicity 4.5 MG/0.5ML Soaj Generic drug: Dulaglutide Inject 4.5 mg into the skin once a week.   Vitamin D  (Ergocalciferol ) 1.25 MG (50000 UNIT) Caps capsule Commonly known as: DRISDOL  Take 50,000 Units by mouth every 7 (seven) days.         Follow-up Information     Adina Buel HERO, MD Follow up on 01/07/2024.   Specialty: Family Medicine Why: APPT: DR BUEL ADINA FOR January 07, 2024 AT 10:00AM Contact information: [address] [place] [address] [phone]                 Allergies  Allergen Reactions   Lorazepam      Makes anxiety worse  Other Reaction(s): Makes anxiety worse   Lisinopril  Cough   Amoxicillin      Patient has tolerated cephalosporins in the past     The results of significant diagnostics from this hospitalization (including imaging, microbiology, ancillary and laboratory) are listed below for reference.    Consultations:   Procedures/Studies: DG Chest Port 1 View Result Date: 12/18/2023 CLINICAL DATA:  Atelectasis EXAM: PORTABLE CHEST 1 VIEW COMPARISON:  December 17, 2023 FINDINGS: Tracheostomy tube in place. Right greater than left streaky bibasilar opacities with slightly improved aeration of the right lung base compared to prior. No new consolidations. No pneumothorax. Unchanged cardiomediastinal silhouette. IMPRESSION: Slightly improved aeration of the right lung base with persistent streaky right greater than left bibasilar opacities, favored to represent atelectasis, although aspiration/infection may have similar appearance. Electronically Signed   By: Michaeline Blanch M.D.   On: 12/18/2023 11:07   DG Chest Port 1 View Result Date: 12/17/2023 EXAM: 1 VIEW XRAY OF THE CHEST 12/17/2023 08:27:35 PM COMPARISON: Radiograph 12/14/2023 and CT 12/14/2023. CLINICAL HISTORY: Status post bronchoscopy. FINDINGS: LUNGS AND PLEURA: Patchy basilar airspace opacities may be due to atelectasis, aspiration, or pneumonia. Small bilateral pleural effusions. No pneumothorax. HEART AND MEDIASTINUM: Stable cardiomediastinal silhouette. BONES AND SOFT TISSUES: No acute osseous abnormality. IMPRESSION: 1. Patchy basilar airspace opacities, possibly due to atelectasis, aspiration, or pneumonia. 2. Small bilateral pleural effusions. 3. No pneumothorax. Electronically signed by: Norman Gatlin MD 12/17/2023 08:45 PM EDT RP Workstation: [id number]   CT CHEST ABDOMEN PELVIS WO CONTRAST Result Date: 12/14/2023 CLINICAL DATA:  Possible sepsis EXAM: CT CHEST, ABDOMEN AND PELVIS WITHOUT CONTRAST TECHNIQUE: Multidetector CT imaging of the chest, abdomen and pelvis was performed following the standard protocol without IV contrast. RADIATION DOSE REDUCTION: This exam was performed according to the departmental dose-optimization program which includes automated exposure control, adjustment of the mA and/or kV according to patient size and/or  use of iterative reconstruction technique. COMPARISON:  09/04/2023, chest x-ray from earlier in the same day. FINDINGS: CT CHEST FINDINGS Cardiovascular: Limited due to lack of IV contrast. Atherosclerotic calcifications of the aorta are seen without aneurysmal dilatation. Coronary calcifications are noted. No significant cardiac enlargement is seen. Mediastinum/Nodes: Thoracic inlet shows a tracheostomy tube in satisfactory position. No hilar or mediastinal adenopathy is noted. The esophagus as visualized is within normal limits. Lungs/Pleura: Right basilar infiltrate is noted similar to that seen on the prior plain film examination. Chronic scarring in the left base is seen. Emphysematous changes are noted. No other focal infiltrate is noted. Musculoskeletal: No acute bony abnormality is noted. CT ABDOMEN PELVIS FINDINGS Hepatobiliary: No focal liver abnormality is seen. No gallstones, gallbladder wall thickening, or biliary dilatation. Pancreas: Unremarkable. No  pancreatic ductal dilatation or surrounding inflammatory changes. Spleen: Normal in size without focal abnormality. Adrenals/Urinary Tract: Adrenal glands are within normal limits. Kidneys are well visualized bilaterally. No renal calculi or obstructive changes are seen. Simple cyst is noted in the midportion of the right kidney stable from prior exam. No follow-up is recommended. The bladder is well distended. Stomach/Bowel: No obstructive or inflammatory changes of the colon are noted. The appendix is within normal limits. Small bowel and stomach are unremarkable. Vascular/Lymphatic: Aortic atherosclerosis. No enlarged abdominal or pelvic lymph nodes. Reproductive: Uterus and bilateral adnexa are unremarkable. Tubal ligation clips are seen bilaterally. Other: No abdominal wall hernia or abnormality. No abdominopelvic ascites. Musculoskeletal: No acute or significant osseous findings. IMPRESSION: Right basilar infiltrate similar to that seen on recent  plain film examination. No other focal abnormality is noted. Electronically Signed   By: Oneil Devonshire M.D.   On: 12/14/2023 03:19   DG Chest Port 1 View Result Date: 12/14/2023 CLINICAL DATA:  Respiratory distress EXAM: PORTABLE CHEST 1 VIEW COMPARISON:  09/02/2023 FINDINGS: Cardiac shadow is within normal limits. Tracheostomy tube is noted in satisfactory position. Chronic blunting of left costophrenic angle is seen. New right basilar atelectasis is seen. IMPRESSION: New right basilar atelectasis. Electronically Signed   By: Oneil Devonshire M.D.   On: 12/14/2023 00:48      Labs: BNP (last 3 results) Recent Labs    04/18/23 2225 04/20/23 0600  BNP 40.3 58.9   Basic Metabolic Panel: Recent Labs  Lab 12/17/23 0241 12/18/23 0523 12/19/23 0310  NA 142 142 143  K 3.5 4.1 4.0  CL 108 109 106  CO2 28 25 27   GLUCOSE 159* 138* 174*  BUN 10 10 11   CREATININE 0.76 0.78 0.85  CALCIUM  8.9 8.8* 9.1  MG 2.2 2.1 1.9  PHOS  --  3.4 3.5   Liver Function Tests: Recent Labs  Lab 12/19/23 0310  ALBUMIN  3.3*   No results for input(s): LIPASE, AMYLASE in the last 168 hours. No results for input(s): AMMONIA in the last 168 hours. CBC: Recent Labs  Lab 12/17/23 0241 12/18/23 0912 12/19/23 0310  WBC 7.0 7.8 7.3  HGB 11.5* 11.3* 11.2*  HCT 37.7 37.2 37.3  MCV 83.8 84.7 84.8  PLT 328 303 333   Cardiac Enzymes: No results for input(s): CKTOTAL, CKMB, CKMBINDEX, TROPONINI in the last 168 hours. BNP: Invalid input(s): POCBNP CBG: Recent Labs  Lab 12/19/23 1923 12/19/23 2149 12/20/23 0728 12/20/23 1148 12/20/23 1533  GLUCAP 182* 193* 229* 219* 185*   D-Dimer No results for input(s): DDIMER in the last 72 hours. Hgb A1c No results for input(s): HGBA1C in the last 72 hours. Lipid Profile No results for input(s): CHOL, HDL, LDLCALC, TRIG, CHOLHDL, LDLDIRECT in the last 72 hours. Thyroid  function studies No results for input(s): TSH, T4TOTAL,  T3FREE, THYROIDAB in the last 72 hours.  Invalid input(s): FREET3 Anemia work up No results for input(s): VITAMINB12, FOLATE, FERRITIN, TIBC, IRON, RETICCTPCT in the last 72 hours. Urinalysis    Component Value Date/Time   COLORURINE YELLOW (A) 12/14/2023 0615   APPEARANCEUR CLOUDY (A) 12/14/2023 0615   APPEARANCEUR Clear 10/30/2013 1230   LABSPEC 1.028 12/14/2023 0615   LABSPEC 1.005 10/30/2013 1230   PHURINE 5.0 12/14/2023 0615   GLUCOSEU 50 (A) 12/14/2023 0615   GLUCOSEU Negative 10/30/2013 1230   HGBUR SMALL (A) 12/14/2023 0615   BILIRUBINUR NEGATIVE 12/14/2023 0615   BILIRUBINUR Negative 10/30/2013 1230   KETONESUR NEGATIVE 12/14/2023 0615   PROTEINUR 100 (  A) 12/14/2023 0615   NITRITE NEGATIVE 12/14/2023 0615   LEUKOCYTESUR LARGE (A) 12/14/2023 0615   LEUKOCYTESUR Negative 10/30/2013 1230   Sepsis Labs Recent Labs  Lab 12/17/23 0241 12/18/23 0912 12/19/23 0310  WBC 7.0 7.8 7.3   Microbiology Recent Results (from the past 240 hours)  Culture, blood (Routine x 2)     Status: None   Collection Time: 12/13/23 11:45 PM   Specimen: BLOOD  Result Value Ref Range Status   Specimen Description BLOOD BLOOD LEFT FOREARM  Final   Special Requests   Final    BOTTLES DRAWN AEROBIC AND ANAEROBIC Blood Culture results may not be optimal due to an inadequate volume of blood received in culture bottles   Culture   Final    NO GROWTH 5 DAYS Performed at [facility], [address] [place], [place] [address]    Report Status 12/19/2023 FINAL  Final  Resp panel by RT-PCR (RSV, Flu A&B, Covid) Nasopharyngeal Swab     Status: None   Collection Time: 12/14/23  3:09 AM   Specimen: Nasopharyngeal Swab; Nasal Swab  Result Value Ref Range Status   SARS Coronavirus 2 by RT PCR NEGATIVE NEGATIVE Final    Comment: (NOTE) SARS-CoV-2 target nucleic acids are NOT DETECTED.  The SARS-CoV-2 RNA is generally detectable in upper respiratory specimens during the  acute phase of infection. The lowest concentration of SARS-CoV-2 viral copies this assay can detect is 138 copies/mL. A negative result does not preclude SARS-Cov-2 infection and should not be used as the sole basis for treatment or other patient management decisions. A negative result may occur with  improper specimen collection/handling, submission of specimen other than nasopharyngeal swab, presence of viral mutation(s) within the areas targeted by this assay, and inadequate number of viral copies(<138 copies/mL). A negative result must be combined with clinical observations, patient history, and epidemiological information. The expected result is Negative.  Fact Sheet for Patients:  BloggerCourse.com  Fact Sheet for Healthcare Providers:  SeriousBroker.it  This test is no t yet approved or cleared by the [place]  FDA and  has been authorized for detection and/or diagnosis of SARS-CoV-2 by FDA under an Emergency Use Authorization (EUA). This EUA will remain  in effect (meaning this test can be used) for the duration of the COVID-19 declaration under Section 564(b)(1) of the Act, 21 U.S.C.section 360bbb-3(b)(1), unless the authorization is terminated  or revoked sooner.       Influenza A by PCR NEGATIVE NEGATIVE Final   Influenza B by PCR NEGATIVE NEGATIVE Final    Comment: (NOTE) The Xpert Xpress SARS-CoV-2/FLU/RSV plus assay is intended as an aid in the diagnosis of influenza from Nasopharyngeal swab specimens and should not be used as a sole basis for treatment. Nasal washings and aspirates are unacceptable for Xpert Xpress SARS-CoV-2/FLU/RSV testing.  Fact Sheet for Patients: BloggerCourse.com  Fact Sheet for Healthcare Providers: SeriousBroker.it  This test is not yet approved or cleared by the [place]  FDA and has been authorized for detection and/or  diagnosis of SARS-CoV-2 by FDA under an Emergency Use Authorization (EUA). This EUA will remain in effect (meaning this test can be used) for the duration of the COVID-19 declaration under Section 564(b)(1) of the Act, 21 U.S.C. section 360bbb-3(b)(1), unless the authorization is terminated or revoked.     Resp Syncytial Virus by PCR NEGATIVE NEGATIVE Final    Comment: (NOTE) Fact Sheet for Patients: BloggerCourse.com  Fact Sheet for Healthcare Providers: SeriousBroker.it  This test is not  yet approved or cleared by the [place]  FDA and has been authorized for detection and/or diagnosis of SARS-CoV-2 by FDA under an Emergency Use Authorization (EUA). This EUA will remain in effect (meaning this test can be used) for the duration of the COVID-19 declaration under Section 564(b)(1) of the Act, 21 U.S.C. section 360bbb-3(b)(1), unless the authorization is terminated or revoked.  Performed at [facility], [address] [place], [place] [address]   MRSA Next Gen by PCR, Nasal     Status: None   Collection Time: 12/14/23  3:34 AM   Specimen: Urine, Unspecified Source; Nasal Swab  Result Value Ref Range Status   MRSA by PCR Next Gen NOT DETECTED NOT DETECTED Final    Comment: (NOTE) The GeneXpert MRSA Assay (FDA approved for NASAL specimens only), is one component of a comprehensive MRSA colonization surveillance program. It is not intended to diagnose MRSA infection nor to guide or monitor treatment for MRSA infections. Test performance is not FDA approved in patients less than 60 years old. Performed at [facility], [address] [place], [place] [address]   Respiratory (~20 pathogens) panel by PCR     Status: None   Collection Time: 12/14/23  7:04 AM   Specimen: Nasopharyngeal Swab; Respiratory  Result Value Ref Range Status   Adenovirus NOT DETECTED NOT DETECTED Final   Coronavirus 229E NOT  DETECTED NOT DETECTED Final    Comment: (NOTE) The Coronavirus on the Respiratory Panel, DOES NOT test for the novel  Coronavirus (2019 nCoV)    Coronavirus HKU1 NOT DETECTED NOT DETECTED Final   Coronavirus NL63 NOT DETECTED NOT DETECTED Final   Coronavirus OC43 NOT DETECTED NOT DETECTED Final   Metapneumovirus NOT DETECTED NOT DETECTED Final   Rhinovirus / Enterovirus NOT DETECTED NOT DETECTED Final   Influenza A NOT DETECTED NOT DETECTED Final   Influenza B NOT DETECTED NOT DETECTED Final   Parainfluenza Virus 1 NOT DETECTED NOT DETECTED Final   Parainfluenza Virus 2 NOT DETECTED NOT DETECTED Final   Parainfluenza Virus 3 NOT DETECTED NOT DETECTED Final   Parainfluenza Virus 4 NOT DETECTED NOT DETECTED Final   Respiratory Syncytial Virus NOT DETECTED NOT DETECTED Final   Bordetella pertussis NOT DETECTED NOT DETECTED Final   Bordetella Parapertussis NOT DETECTED NOT DETECTED Final   Chlamydophila pneumoniae NOT DETECTED NOT DETECTED Final   Mycoplasma pneumoniae NOT DETECTED NOT DETECTED Final    Comment: Performed at [facility], [address] [place], [place] [address]  Culture, BAL-quantitative w Gram Stain     Status: Abnormal   Collection Time: 12/17/23  3:40 PM   Specimen: Bronchoalveolar Lavage; Respiratory  Result Value Ref Range Status   Specimen Description   Final    BRONCHIAL ALVEOLAR LAVAGE Performed at [facility], [address], [place], [place] [address]    Special Requests   Final    NONE Performed at [facility], [address] [place], [place] [address]    Gram Stain   Final    FEW WBC PRESENT, PREDOMINANTLY PMN RARE BUDDING YEAST SEEN Performed at [facility], [address] [place], [place] [address]    Culture >=100,000 COLONIES/mL CANDIDA ALBICANS (A)  Final   Report Status 12/20/2023 FINAL  Final  Aspergillus Ag, BAL/Serum     Status: Abnormal   Collection Time: 12/17/23  3:40 PM   Specimen: Bronchial  Alveolar Lavage  Result Value Ref Range Status   Aspergillus Ag, BAL/Serum  0.60 (H) 0.00 - 0.49 Index Final    Comment: (NOTE) Performed At: [facility] [address] [place], [place] [id number] Jennette Shorter MD Ey:[id number]   Acid Fast Smear (AFB)     Status: None   Collection Time: 12/17/23  3:40 PM   Specimen: Bronchoalveolar Lavage; Respiratory  Result Value Ref Range Status   AFB Specimen Processing Concentration  Final   Acid Fast Smear Negative  Final    Comment: (NOTE) Performed At: [facility] [address] [place], [place] [id number] Jennette Shorter MD Ey:[id number]    Source (AFB) BRONCHIAL ALVEOLAR LAVAGE  Final    Comment: Performed at [facility], [address] [place], [place] [address]     Total time spend on discharging this patient, including the last patient exam, discussing the hospital stay, instructions for ongoing care as it relates to all pertinent caregivers, as well as preparing the medical discharge records, prescriptions, and/or referrals as applicable, is 45 minutes.    Ellouise Haber, MD  [facility] 12/23/2023, 7:30 PM

## 2023-12-20 NOTE — Progress Notes (Signed)
 [facility] Nurse Liaison Note  Received referral for hospice at home post discharge from [place], TOC.  I called and spoke with patient's daughter, Loren Vicens to initiate education related to hospice philosophy, benefit and team approach to care.  [name] verbalized understanding and request hospice at discharge.  Discharge planned for today from ICU via EMS.  DME needs discussed.  No DME needs that would hold up patient's discharge.  DME in the home:  oxygen, hospital bed, hoyer lift, BSC, wheelchair, walker and rollator. DME needs:  None  Address verified and is correct in patient's chart.  Notified hospital medical team and [facility] of above.  Thank you for allowing participation in this patient's care.  Saddie HILARIO Na, RN Nurse Liaison [phone]

## 2023-12-20 NOTE — Plan of Care (Signed)
  Problem: Education: Goal: Ability to describe self-care measures that may prevent or decrease complications (Diabetes Survival Skills Education) will improve Outcome: Progressing   Problem: Coping: Goal: Ability to adjust to condition or change in health will improve Outcome: Progressing   Problem: Fluid Volume: Goal: Ability to maintain a balanced intake and output will improve Outcome: Progressing   Problem: Health Behavior/Discharge Planning: Goal: Ability to identify and utilize available resources and services will improve Outcome: Progressing   Problem: Metabolic: Goal: Ability to maintain appropriate glucose levels will improve Outcome: Progressing   Problem: Nutritional: Goal: Maintenance of adequate nutrition will improve Outcome: Progressing   Problem: Clinical Measurements: Goal: Ability to maintain clinical measurements within normal limits will improve Outcome: Progressing

## 2023-12-20 NOTE — Progress Notes (Signed)
 OT Cancellation Note  Patient Details Name: Michelle [name] MRN: [id number] DOB: 14-May-1961   Cancelled Treatment:    Reason Eval/Treat Not Completed: Patient declined, no reason specified. Chart reviewed and pt premedicated for session. On arrival pt repeatedly refuses any mobility attempts and bed level ADLs/exercises. Reports fear of becoming short of breath. Will hold and re-attempt as able and pt willing.   Elston Slot, M.S. OTR/L  12/20/23, 8:57 AM  ascom [phone]

## 2023-12-20 NOTE — Progress Notes (Signed)
 Pt discharged home at this time. Pt taken by EMS transport, daughter riding with EMS to manage home vent. Discharge packet provided.

## 2024-01-31 LAB — ACID FAST CULTURE WITH REFLEXED SENSITIVITIES (MYCOBACTERIA): Acid Fast Culture: NEGATIVE

## 2024-02-18 ENCOUNTER — Ambulatory Visit: Admitting: Dermatology

## 2024-03-02 ENCOUNTER — Ambulatory Visit: Admitting: Dermatology

## 2024-03-02 ENCOUNTER — Ambulatory Visit
Admission: EM | Admit: 2024-03-02 | Discharge: 2024-03-02 | Disposition: A | Discharge status: Home / Self Care | Attending: Family Medicine | Admitting: Family Medicine

## 2024-03-02 DIAGNOSIS — B379 Candidiasis, unspecified: Secondary | ICD-10-CM | POA: Diagnosis not present

## 2024-03-02 DIAGNOSIS — J069 Acute upper respiratory infection, unspecified: Secondary | ICD-10-CM | POA: Insufficient documentation

## 2024-03-02 DIAGNOSIS — N3001 Acute cystitis with hematuria: Secondary | ICD-10-CM | POA: Insufficient documentation

## 2024-03-02 LAB — URINALYSIS, W/ REFLEX TO CULTURE (INFECTION SUSPECTED)
Bilirubin Urine: NEGATIVE
Glucose, UA: 100 mg/dL — AB
Ketones, ur: NEGATIVE mg/dL
Nitrite: NEGATIVE
Protein, ur: 30 mg/dL — AB
Specific Gravity, Urine: 1.025 (ref 1.005–1.030)
WBC, UA: >50 WBC/hpf (ref 0–5)
pH: 6.5 (ref 5.0–8.0)

## 2024-03-02 MED ORDER — FLUCONAZOLE 150 MG PO TABS: 150.0000 mg | ORAL_TABLET | ORAL | 0 refills | Status: DC

## 2024-03-02 MED ORDER — CEFDINIR 300 MG PO CAPS: 300.0000 mg | ORAL_CAPSULE | Freq: Two times a day (BID) | ORAL | 0 refills | Status: DC

## 2024-03-02 NOTE — ED Triage Notes (Signed)
 Pt c/o urinary freq,burning & pain x1 wk. Denies any hematuria. Has tried OTC meds w/o relief.

## 2024-03-02 NOTE — ED Provider Notes (Signed)
 [facility]    CSN: [id number] Arrival date & time: 03/02/24  1632      History   Chief Complaint Chief Complaint  Patient presents with   Dysuria     HPI HPI Michelle [name] is a 63 y.o. female.    Michelle [name] presents for abdominal cramping, dysuria, bladder pressure with vaginal discharge that started 2 weeks ago.  No fever, vomiting, diarrhea, vaginal bleeding or hematuria. Tried nothing for symptoms prior to arrival.  Has  not had any antibiotics in last 30 days. Patient's last menstrual period was 03/06/2005 (approximate).    Has some bilateral ear pain, sinus pressure, cough that started 2-3 days.   Denies sore throat. Declines COVID tests.         Past Medical History:  Diagnosis Date   Allergy    Anxiety    Asthma    CHF (congestive heart failure) (HCC)    Cocaine abuse (HCC)    COPD (chronic obstructive pulmonary disease) (HCC)    Coronary artery disease    Emphysema/COPD    Hepatitis C    High cholesterol    Hypertension    Pneumothorax    Tobacco abuse     Patient Active Problem List   Diagnosis Date Noted   Sepsis due to urinary tract infection (HCC) 12/14/2023   Ventilator dependent (HCC) 09/02/2023   Sepsis due to pneumonia (HCC) 04/19/2023   Essential hypertension 04/19/2023   COPD exacerbation (HCC) 04/19/2023   Anxiety and depression 04/19/2023   Type 2 diabetes mellitus without complications (HCC) 04/19/2023   Abnormal CT scan 04/19/2023   Obesity (BMI 30-39.9) 02/19/2023   Dysuria 02/18/2023   Elevated troponin 02/18/2023   COVID-19 virus infection 02/18/2023   Tracheostomy in place [facility]) 04/02/2022   Acute on chronic respiratory failure (HCC) 09/30/2021   Urine retention 02/07/2021   Candidiasis of breast 11/15/2020   Atypical pneumonia 08/29/2019   HCAP (healthcare-associated pneumonia) 08/28/2019   Pseudomonas respiratory infection 07/03/2019   Depression with anxiety 07/03/2019   Hyperlipidemia associated with  type 2 diabetes mellitus (HCC) 07/03/2019   PEG (percutaneous endoscopic gastrostomy) status (HCC) 07/03/2019   Chronic pain 07/03/2019   Hypokalemia 07/03/2019   DM (diabetes mellitus) (HCC) 06/24/2019   Chronic respiratory failure requiring continuous mechanical ventilation through tracheostomy (HCC) 10/20/2018   CAD (coronary artery disease) 10/20/2018   HTN (hypertension) 10/20/2018   Chronic diastolic CHF (congestive heart failure) (HCC) 10/20/2018   Pneumonia 09/17/2018   Panic disorder 10/29/2017   Altered mental status    Pressure injury of skin 10/23/2017   Acute on chronic respiratory failure with hypoxia and hypercapnia (HCC) 06/29/2017   Acute on chronic respiratory failure with hypoxia (HCC) 02/23/2017   Protein-calorie malnutrition, severe 05/23/2016   Sepsis (HCC) 05/22/2016   Cocaine abuse (HCC) 04/07/2015   Malnutrition of moderate degree 10/25/2014   COPD (chronic obstructive pulmonary disease) (HCC) 10/24/2014   Tobacco abuse 10/24/2014   Community acquired pneumonia 10/24/2014    Past Surgical History:  Procedure Laterality Date   CARDIAC CATHETERIZATION     CHEST TUBE INSERTION     IR GASTROSTOMY TUBE MOD SED  10/31/2017   TRACHEOSTOMY TUBE PLACEMENT N/A 10/17/2017   Procedure: TRACHEOSTOMY;  Surgeon: Milissa Hamming, MD;  Location: [facility] ORS;  Service: ENT;  Laterality: N/A;    OB History   No obstetric history on file.      Home Medications    Prior to Admission medications   Medication Sig Start Date  End Date Taking? Authorizing Provider  cefdinir (OMNICEF) 300 MG capsule Take 1 capsule (300 mg total) by mouth 2 (two) times daily. 03/02/24  Yes Ladonya Jerkins, DO  fluconazole  (DIFLUCAN ) 150 MG tablet Take 1 tablet (150 mg total) by mouth once a week. 03/02/24  Yes Zain Lankford, DO  acetaminophen  (TYLENOL ) 325 MG tablet Take 2 tablets (650 mg total) by mouth every 6 (six) hours as needed for mild pain (pain score 1-3) (or Fever >/= 101). 04/24/23    Dorinda Drue DASEN, MD  albuterol  (PROVENTIL ) (2.5 MG/3ML) 0.083% nebulizer solution Take 3 mLs (2.5 mg total) by nebulization every 6 (six) hours as needed for wheezing or shortness of breath. 03/27/23   Tamea Dedra CROME, MD  albuterol  (VENTOLIN  HFA) 108 [phone] Base) MCG/ACT inhaler Inhale 2 puffs into the lungs every 6 (six) hours as needed for wheezing or shortness of breath. 04/02/22   Parrett, Madelin RAMAN, NP  amLODipine  (NORVASC ) 5 MG tablet Take 5 mg by mouth daily. 08/27/21   [provider]  aspirin  81 MG chewable tablet Chew 81 mg by mouth daily.     [provider]  azelastine  (ASTELIN ) 0.1 % nasal spray SMARTSIG:1-2 Spray(s) Both Nares Every 12 Hours PRN 09/22/20   [provider]  Blood Glucose Monitoring Suppl ([employer] BLOOD GLUCOSE METER) w/Device KIT Use as directed to monitor blood glucose up to qid 02/22/23   Alexander, Natalie, DO  cetirizine  (ZYRTEC ) 10 MG tablet Take 1 tablet (10 mg total) by mouth daily as needed for allergies. Home med. 12/20/23   Awanda City, MD  clonazePAM  (KLONOPIN ) 0.5 MG tablet Take 0.5 mg by mouth in the morning, at noon, and at bedtime.    [provider]  cloNIDine  (CATAPRES  - DOSED IN MG/24 HR) 0.1 mg/24hr patch Place 0.1 mg onto the skin once a week.    [provider]  docusate sodium  (COLACE) 100 MG capsule Take 2 capsules (200 mg total) by mouth daily as needed for mild constipation. 04/24/23   Dorinda Drue DASEN, MD  famotidine  (PEPCID ) 20 MG tablet Take 1 tablet by mouth daily as needed for heartburn 09/24/23   [provider]  fluticasone  (FLONASE ) 50 MCG/ACT nasal spray Place 1 spray into both nostrils daily as needed for allergies or rhinitis. Home med. 12/20/23   Awanda City, MD  guaiFENesin  (MUCINEX ) 600 MG 12 hr tablet Take 600 mg by mouth 2 (two) times daily.    [provider]  hydrOXYzine  (VISTARIL ) 25 MG capsule Take 25 mg by mouth every 6 (six) hours as needed. 10/01/22   [provider]   ipratropium-albuterol  (DUONEB) 0.5-2.5 (3) MG/3ML SOLN Take 3 mLs by nebulization every 6 (six) hours as needed (for shortness of breath). 06/17/23   Kasa, Kurian, MD  LANTUS  SOLOSTAR 100 UNIT/ML Solostar Pen Inject 40 Units into the skin at bedtime. 01/03/23   [provider]  mirtazapine  (REMERON ) 30 MG tablet Take 1 tablet by mouth every night at bedtime 10/17/23   [provider]  Multiple Vitamin (MULTIVITAMIN WITH MINERALS) TABS tablet Take 1 tablet by mouth daily. 12/20/23   Awanda City, MD  naloxone  (NARCAN ) nasal spray 4 mg/0.1 mL Place 1 spray into the nose once as needed for up to 1 dose. Home med. 12/20/23   Awanda City, MD  NOVOLOG  FLEXPEN 100 UNIT/ML FlexPen Inject 5-10 Units into the skin 3 (three) times daily with meals. Sliding scale mealtime insulin . 03/01/21   [provider]  nystatin  ointment (MYCOSTATIN ) Apply  1 Application topically 3 (three) times daily as needed. Home med. 12/20/23   Awanda City, MD  oxyCODONE -acetaminophen  (PERCOCET/ROXICET) 5-325 MG tablet Take 1 tablet by mouth 3 (three) times daily as needed for pain. 09/07/21   [provider]  PARoxetine  (PAXIL ) 30 MG tablet Take 30 mg by mouth daily. 09/19/23   [provider]  polyethylene glycol powder (GLYCOLAX /MIRALAX ) 17 GM/SCOOP powder Take 17 g by mouth daily as needed. Home med. 12/20/23   Awanda City, MD  PULMICORT  0.5 MG/2ML nebulizer solution Take 0.5 mg by nebulization every 12 (twelve) hours. 02/13/21   [provider]  terbinafine (LAMISIL) 1 % cream Apply 1 application  topically 2 (two) times daily. To affected skin, for 1 week after resolution of rash. # 30 gm, 1 refill.    [provider]  TRULICITY 4.5 MG/0.5ML SOPN Inject 4.5 mg into the skin once a week. 09/28/21   [provider]  Vitamin D , Ergocalciferol , (DRISDOL ) 1.25 MG (50000 UNIT) CAPS capsule Take 50,000 Units by mouth every 7 (seven) days.    [provider]  fluconazole   (DIFLUCAN ) 150 MG tablet Take 1 tablet day 1 repeat in 1 week 11/15/20   Moye, Virginia , MD    Family History Family History  Problem Relation Age of Onset   Lung cancer Mother    Asthma Mother    CVA Father    CAD Father    Stroke Father    Breast cancer Maternal Aunt 55   COPD Sister     Social History Social History   Tobacco Use   Smoking status: Former    Current packs/day: 0.00    Average packs/day: 0.1 packs/day for 41.0 years (4.1 ttl pk-yrs)    Types: Cigarettes    Start date: 06/09/1976    Quit date: 06/09/2017    Years since quitting: 6.7   Smokeless tobacco: Never  Vaping Use   Vaping status: Never Used  Substance Use Topics   Alcohol use: No   Drug use: Not Currently    Types: Crack cocaine    Comment: Past cocaine use.  Quit 04/2017     Allergies   Lorazepam , Lisinopril , and Amoxicillin    Review of Systems Review of Systems: :negative unless otherwise stated in HPI.      Physical Exam Triage Vital Signs ED Triage Vitals  Encounter Vitals Group     BP 03/02/24 1645 129/77     Girls Systolic BP Percentile --      Girls Diastolic BP Percentile --      Boys Systolic BP Percentile --      Boys Diastolic BP Percentile --      Pulse Rate 03/02/24 1645 68     Resp 03/02/24 1645 16     Temp 03/02/24 1645 98.1 F (36.7 C)     Temp Source 03/02/24 1645 Oral     SpO2 03/02/24 1645 100 %     Weight --      Height --      Head Circumference --      Peak Flow --      Pain Score 03/02/24 1648 6     Pain Loc --      Pain Education --      Exclude from Growth Chart --    No data found.  Updated Vital Signs BP 129/77 (BP Location: Left Arm)   Pulse 68   Temp 98.1 F (36.7 C) (Oral)   Resp 16   LMP 03/06/2005 (  Approximate)   SpO2 100%   Visual Acuity Right Eye Distance:   Left Eye Distance:   Bilateral Distance:    Right Eye Near:   Left Eye Near:    Bilateral Near:     Physical Exam GEN: well appearing female in no acute distress   HENT: no oropharyngeal lesions, normal bilateral TM, no nasal discharge, moist mucus membranes,  CVS: well perfused  RESP: speaking in full sentences without pause     UC Treatments / Results  Labs (all labs ordered are listed, but only abnormal results are displayed) Labs Reviewed  URINALYSIS, W/ REFLEX TO CULTURE (INFECTION SUSPECTED) - Abnormal; Notable for the following components:      Result Value   APPearance CLOUDY (*)    Glucose, UA 100 (*)    Hgb urine dipstick TRACE (*)    Protein, ur 30 (*)    Leukocytes,Ua MODERATE (*)    Bacteria, UA MANY (*)    All other components within normal limits  URINE CULTURE    EKG   Radiology No results found.  Procedures Procedures (including critical care time)  Medications Ordered in UC Medications - No data to display  Initial Impression / Assessment and Plan / UC Course  I have reviewed the triage vital signs and the nursing notes.  Pertinent labs & imaging results that were available during my care of the patient were reviewed by me and considered in my medical decision making (see chart for details).         Patient is a 63 y.o. female  who presents for 7 days of dysuria and urinary frequency.  Overall patient is well-appearing and afebrile.  Vital signs stable.  Urinalysis consistent with acute cystitis.  Hematuria supported on microscopy.  Treat with Cefdinir 2 times daily for 5 days.  Urine culture obtained.  Follow-up sensitivities and change antibiotics, if needed. She did have some yeast seen on urine microscopy.  Treat with Diflucan . Return precautions including abdominal pain, fever, chills, nausea, or vomiting given. Follow-up,  if symptoms not improving or getting worse.   She has symptoms of a viral upper respiratory infection.  Respiratory testing declined. Symptomatic treatment discussed.   Discussed MDM, treatment plan and plan for follow-up with patient who agrees with plan.        Final Clinical  Impressions(s) / UC Diagnoses   Final diagnoses:  Acute cystitis with hematuria  Yeast infection  URI with cough and congestion     Discharge Instructions      You have a urinary tract infection. I sent your urine for culture to be sure the antibiotic prescribed will treat your infection. Someone may call you to change antibiotics. Your urine showed some yeast so I tried that with a medication called Diflucan .  Stop by the pharmacy to pick up your prescriptions.  Follow up with your primary care provider or return to the urgent care, if not improving.         ED Prescriptions     Medication Sig Dispense Auth. Provider   cefdinir (OMNICEF) 300 MG capsule Take 1 capsule (300 mg total) by mouth 2 (two) times daily. 10 capsule Malaika Arnall, DO   fluconazole  (DIFLUCAN ) 150 MG tablet Take 1 tablet (150 mg total) by mouth once a week. 2 tablet Kriste Berth, DO      PDMP not reviewed this encounter.   Golden Gilreath, DO 03/02/24 1737

## 2024-03-02 NOTE — Discharge Instructions (Signed)
 You have a urinary tract infection. I sent your urine for culture to be sure the antibiotic prescribed will treat your infection. Someone may call you to change antibiotics. Your urine showed some yeast so I tried that with a medication called Diflucan .  Stop by the pharmacy to pick up your prescriptions.  Follow up with your primary care provider or return to the urgent care, if not improving.

## 2024-03-03 ENCOUNTER — Ambulatory Visit (HOSPITAL_COMMUNITY): Payer: Self-pay

## 2024-03-03 LAB — URINE CULTURE: Culture: >=100000 — AB

## 2024-03-08 ENCOUNTER — Other Ambulatory Visit: Payer: Self-pay

## 2024-03-08 ENCOUNTER — Inpatient Hospital Stay
Admission: EM | Admit: 2024-03-08 | Discharge: 2024-03-12 | DRG: 207 | Disposition: A | Discharge status: Home / Self Care | Attending: Pulmonary Disease | Admitting: Pulmonary Disease

## 2024-03-08 ENCOUNTER — Emergency Department

## 2024-03-08 ENCOUNTER — Encounter: Payer: Self-pay | Admitting: Emergency Medicine

## 2024-03-08 DIAGNOSIS — Z930 Tracheostomy status: Secondary | ICD-10-CM

## 2024-03-08 DIAGNOSIS — Z9911 Dependence on respirator [ventilator] status: Secondary | ICD-10-CM | POA: Diagnosis not present

## 2024-03-08 DIAGNOSIS — Z7985 Long-term (current) use of injectable non-insulin antidiabetic drugs: Secondary | ICD-10-CM

## 2024-03-08 DIAGNOSIS — Z801 Family history of malignant neoplasm of trachea, bronchus and lung: Secondary | ICD-10-CM

## 2024-03-08 DIAGNOSIS — I252 Old myocardial infarction: Secondary | ICD-10-CM

## 2024-03-08 DIAGNOSIS — I110 Hypertensive heart disease with heart failure: Secondary | ICD-10-CM | POA: Diagnosis present

## 2024-03-08 DIAGNOSIS — I5032 Chronic diastolic (congestive) heart failure: Secondary | ICD-10-CM | POA: Diagnosis present

## 2024-03-08 DIAGNOSIS — R7989 Other specified abnormal findings of blood chemistry: Secondary | ICD-10-CM | POA: Diagnosis not present

## 2024-03-08 DIAGNOSIS — Z8249 Family history of ischemic heart disease and other diseases of the circulatory system: Secondary | ICD-10-CM

## 2024-03-08 DIAGNOSIS — Z955 Presence of coronary angioplasty implant and graft: Secondary | ICD-10-CM

## 2024-03-08 DIAGNOSIS — I214 Non-ST elevation (NSTEMI) myocardial infarction: Secondary | ICD-10-CM

## 2024-03-08 DIAGNOSIS — Z803 Family history of malignant neoplasm of breast: Secondary | ICD-10-CM

## 2024-03-08 DIAGNOSIS — I25118 Atherosclerotic heart disease of native coronary artery with other forms of angina pectoris: Secondary | ICD-10-CM | POA: Diagnosis not present

## 2024-03-08 DIAGNOSIS — Z794 Long term (current) use of insulin: Secondary | ICD-10-CM | POA: Diagnosis not present

## 2024-03-08 DIAGNOSIS — J441 Chronic obstructive pulmonary disease with (acute) exacerbation: Secondary | ICD-10-CM

## 2024-03-08 DIAGNOSIS — Z823 Family history of stroke: Secondary | ICD-10-CM

## 2024-03-08 DIAGNOSIS — R031 Nonspecific low blood-pressure reading: Secondary | ICD-10-CM | POA: Diagnosis present

## 2024-03-08 DIAGNOSIS — E782 Mixed hyperlipidemia: Secondary | ICD-10-CM

## 2024-03-08 DIAGNOSIS — J90 Pleural effusion, not elsewhere classified: Secondary | ICD-10-CM | POA: Diagnosis not present

## 2024-03-08 DIAGNOSIS — J9621 Acute and chronic respiratory failure with hypoxia: Secondary | ICD-10-CM | POA: Diagnosis present

## 2024-03-08 DIAGNOSIS — Z8614 Personal history of Methicillin resistant Staphylococcus aureus infection: Secondary | ICD-10-CM

## 2024-03-08 DIAGNOSIS — E66811 Obesity, class 1: Secondary | ICD-10-CM | POA: Diagnosis present

## 2024-03-08 DIAGNOSIS — J9620 Acute and chronic respiratory failure, unspecified whether with hypoxia or hypercapnia: Secondary | ICD-10-CM | POA: Diagnosis present

## 2024-03-08 DIAGNOSIS — J9611 Chronic respiratory failure with hypoxia: Secondary | ICD-10-CM

## 2024-03-08 DIAGNOSIS — E7800 Pure hypercholesterolemia, unspecified: Secondary | ICD-10-CM | POA: Diagnosis present

## 2024-03-08 DIAGNOSIS — Z825 Family history of asthma and other chronic lower respiratory diseases: Secondary | ICD-10-CM

## 2024-03-08 DIAGNOSIS — J9622 Acute and chronic respiratory failure with hypercapnia: Secondary | ICD-10-CM

## 2024-03-08 DIAGNOSIS — Z87891 Personal history of nicotine dependence: Secondary | ICD-10-CM

## 2024-03-08 DIAGNOSIS — R0602 Shortness of breath: Principal | ICD-10-CM

## 2024-03-08 DIAGNOSIS — E1165 Type 2 diabetes mellitus with hyperglycemia: Secondary | ICD-10-CM | POA: Diagnosis present

## 2024-03-08 DIAGNOSIS — E669 Obesity, unspecified: Secondary | ICD-10-CM | POA: Diagnosis present

## 2024-03-08 DIAGNOSIS — I21A1 Myocardial infarction type 2: Secondary | ICD-10-CM | POA: Diagnosis present

## 2024-03-08 DIAGNOSIS — Z880 Allergy status to penicillin: Secondary | ICD-10-CM

## 2024-03-08 DIAGNOSIS — Z888 Allergy status to other drugs, medicaments and biological substances status: Secondary | ICD-10-CM

## 2024-03-08 DIAGNOSIS — I700 Atherosclerosis of aorta: Secondary | ICD-10-CM | POA: Diagnosis not present

## 2024-03-08 DIAGNOSIS — Z7982 Long term (current) use of aspirin: Secondary | ICD-10-CM | POA: Diagnosis not present

## 2024-03-08 DIAGNOSIS — Z1152 Encounter for screening for COVID-19: Secondary | ICD-10-CM

## 2024-03-08 DIAGNOSIS — J189 Pneumonia, unspecified organism: Principal | ICD-10-CM | POA: Diagnosis present

## 2024-03-08 DIAGNOSIS — Z6834 Body mass index (BMI) 34.0-34.9, adult: Secondary | ICD-10-CM | POA: Diagnosis not present

## 2024-03-08 DIAGNOSIS — Z79899 Other long term (current) drug therapy: Secondary | ICD-10-CM

## 2024-03-08 DIAGNOSIS — I2510 Atherosclerotic heart disease of native coronary artery without angina pectoris: Secondary | ICD-10-CM | POA: Diagnosis present

## 2024-03-08 DIAGNOSIS — J439 Emphysema, unspecified: Secondary | ICD-10-CM | POA: Diagnosis present

## 2024-03-08 DIAGNOSIS — I2489 Other forms of acute ischemic heart disease: Secondary | ICD-10-CM | POA: Diagnosis not present

## 2024-03-08 LAB — LIPASE, BLOOD: Lipase: 41 U/L (ref 11–51)

## 2024-03-08 LAB — URINALYSIS, ROUTINE W REFLEX MICROSCOPIC
Bilirubin Urine: NEGATIVE
Glucose, UA: 150 mg/dL — AB
Ketones, ur: NEGATIVE mg/dL
Nitrite: NEGATIVE
Protein, ur: >=300 mg/dL — AB
Specific Gravity, Urine: 1.01 (ref 1.005–1.030)
pH: 6 (ref 5.0–8.0)

## 2024-03-08 LAB — D-DIMER, QUANTITATIVE: D-Dimer, Quant: 0.38 ug{FEU}/mL (ref 0.00–0.50)

## 2024-03-08 LAB — CBC WITH DIFFERENTIAL/PLATELET
Abs Immature Granulocytes: 0.05 K/uL (ref 0.00–0.07)
Basophils Absolute: 0 K/uL (ref 0.0–0.1)
Basophils Relative: 0 %
Eosinophils Absolute: 0.2 K/uL (ref 0.0–0.5)
Eosinophils Relative: 2 %
HCT: 38.6 % (ref 36.0–46.0)
Hemoglobin: 11.7 g/dL — ABNORMAL LOW (ref 12.0–15.0)
Immature Granulocytes: 1 %
Lymphocytes Relative: 15 %
Lymphs Abs: 1.5 K/uL (ref 0.7–4.0)
MCH: 25.4 pg — ABNORMAL LOW (ref 26.0–34.0)
MCHC: 30.3 g/dL (ref 30.0–36.0)
MCV: 83.7 fL (ref 80.0–100.0)
Monocytes Absolute: 0.4 K/uL (ref 0.1–1.0)
Monocytes Relative: 4 %
Neutro Abs: 7.8 K/uL — ABNORMAL HIGH (ref 1.7–7.7)
Neutrophils Relative %: 78 %
Platelets: 275 K/uL (ref 150–400)
RBC: 4.61 MIL/uL (ref 3.87–5.11)
RDW: 14.5 % (ref 11.5–15.5)
WBC: 10 K/uL (ref 4.0–10.5)
nRBC: 0 % (ref 0.0–0.2)

## 2024-03-08 LAB — COMPREHENSIVE METABOLIC PANEL WITH GFR
ALT: 12 U/L (ref 0–44)
AST: 22 U/L (ref 15–41)
Albumin: 3.5 g/dL (ref 3.5–5.0)
Alkaline Phosphatase: 86 U/L (ref 38–126)
Anion gap: 12 (ref 5–15)
BUN: 18 mg/dL (ref 8–23)
CO2: 25 mmol/L (ref 22–32)
Calcium: 9.4 mg/dL (ref 8.9–10.3)
Chloride: 103 mmol/L (ref 98–111)
Creatinine, Ser: 0.8 mg/dL (ref 0.44–1.00)
GFR, Estimated: >60 mL/min (ref >60)
Glucose, Bld: 230 mg/dL — ABNORMAL HIGH (ref 70–99)
Potassium: 4.1 mmol/L (ref 3.5–5.1)
Sodium: 140 mmol/L (ref 135–145)
Total Bilirubin: 0.6 mg/dL (ref 0.0–1.2)
Total Protein: 8.2 g/dL — ABNORMAL HIGH (ref 6.5–8.1)

## 2024-03-08 LAB — BLOOD GAS, ARTERIAL
Acid-Base Excess: 8.4 mmol/L — ABNORMAL HIGH (ref 0.0–2.0)
Bicarbonate: 34.4 mmol/L — ABNORMAL HIGH (ref 20.0–28.0)
FIO2: 28 %
Mechanical Rate: 16
O2 Saturation: 94.6 %
PEEP: 5 cmH2O
Patient temperature: 37
Pressure support: 10 cmH2O
RATE: 16 {breaths}/min
Spontaneous VT: 400 mL
pCO2 arterial: 53 mmHg — ABNORMAL HIGH (ref 32–48)
pH, Arterial: 7.42 (ref 7.35–7.45)
pO2, Arterial: 64 mmHg — ABNORMAL LOW (ref 83–108)

## 2024-03-08 LAB — TSH: TSH: 2.297 u[IU]/mL (ref 0.350–4.500)

## 2024-03-08 LAB — PROTIME-INR
INR: 1 (ref 0.8–1.2)
Prothrombin Time: 13.5 s (ref 11.4–15.2)

## 2024-03-08 LAB — LIPID PANEL
Cholesterol: 191 mg/dL (ref 0–200)
HDL: 45 mg/dL (ref >40)
LDL Cholesterol: 116 mg/dL — ABNORMAL HIGH (ref 0–99)
Total CHOL/HDL Ratio: 4.2 ratio
Triglycerides: 149 mg/dL (ref <150)
VLDL: 30 mg/dL (ref 0–40)

## 2024-03-08 LAB — LACTIC ACID, PLASMA
Lactic Acid, Venous: 1.3 mmol/L (ref 0.5–1.9)
Lactic Acid, Venous: 1.2 mmol/L (ref 0.5–1.9)

## 2024-03-08 LAB — TROPONIN I (HIGH SENSITIVITY)
Troponin I (High Sensitivity): 389 ng/L (ref <18)
Troponin I (High Sensitivity): 407 ng/L (ref <18)
Troponin I (High Sensitivity): 350 ng/L (ref <18)

## 2024-03-08 LAB — MRSA NEXT GEN BY PCR, NASAL: MRSA by PCR Next Gen: NOT DETECTED

## 2024-03-08 LAB — RESP PANEL BY RT-PCR (RSV, FLU A&B, COVID)  RVPGX2
Influenza A by PCR: NEGATIVE
Influenza B by PCR: NEGATIVE
Resp Syncytial Virus by PCR: NEGATIVE
SARS Coronavirus 2 by RT PCR: NEGATIVE

## 2024-03-08 LAB — GLUCOSE, CAPILLARY: Glucose-Capillary: 173 mg/dL — ABNORMAL HIGH (ref 70–99)

## 2024-03-08 LAB — HEPARIN LEVEL (UNFRACTIONATED): Heparin Unfractionated: 0.14 [IU]/mL — ABNORMAL LOW (ref 0.30–0.70)

## 2024-03-08 LAB — CK: Total CK: 180 U/L (ref 38–234)

## 2024-03-08 LAB — BRAIN NATRIURETIC PEPTIDE: B Natriuretic Peptide: 162.6 pg/mL — ABNORMAL HIGH (ref 0.0–100.0)

## 2024-03-08 LAB — APTT: aPTT: 32 s (ref 24–36)

## 2024-03-08 MED ORDER — AMLODIPINE BESYLATE 5 MG PO TABS
5.0000 mg | ORAL_TABLET | Freq: Every day | ORAL | Status: DC
  Administered 2024-03-09 – 2024-03-11 (×3): 5 mg via ORAL
  Filled 2024-03-08 (×4): qty 1

## 2024-03-08 MED ORDER — POLYETHYLENE GLYCOL 3350 17 G PO PACK: 17.0000 g | PACK | Freq: Every day | ORAL | Status: DC | PRN

## 2024-03-08 MED ORDER — DOCUSATE SODIUM 100 MG PO CAPS
100.0000 mg | ORAL_CAPSULE | Freq: Two times a day (BID) | ORAL | Status: DC | PRN
  Administered 2024-03-11: 100 mg via ORAL
  Filled 2024-03-08: qty 1

## 2024-03-08 MED ORDER — OXYCODONE-ACETAMINOPHEN 5-325 MG PO TABS
1.0000 | ORAL_TABLET | Freq: Four times a day (QID) | ORAL | Status: DC | PRN
  Administered 2024-03-08 – 2024-03-12 (×11): 1 via ORAL
  Filled 2024-03-08 (×11): qty 1

## 2024-03-08 MED ORDER — CLONAZEPAM 0.5 MG PO TABS
0.5000 mg | ORAL_TABLET | Freq: Three times a day (TID) | ORAL | Status: DC | PRN
  Administered 2024-03-08 – 2024-03-12 (×7): 0.5 mg via ORAL
  Filled 2024-03-08 (×8): qty 1

## 2024-03-08 MED ORDER — CLONIDINE HCL 0.1 MG/24HR TD PTWK
0.1000 mg | MEDICATED_PATCH | TRANSDERMAL | Status: DC
  Administered 2024-03-08: 0.1 mg via TRANSDERMAL
  Filled 2024-03-08: qty 1

## 2024-03-08 MED ORDER — ASPIRIN 81 MG PO TBEC: 81.0000 mg | DELAYED_RELEASE_TABLET | Freq: Every day | ORAL | Status: DC

## 2024-03-08 MED ORDER — ASPIRIN 81 MG PO TBEC
81.0000 mg | DELAYED_RELEASE_TABLET | Freq: Every day | ORAL | Status: DC
  Administered 2024-03-09 – 2024-03-12 (×4): 81 mg via ORAL
  Filled 2024-03-08 (×4): qty 1

## 2024-03-08 MED ORDER — ASPIRIN 81 MG PO CHEW
324.0000 mg | CHEWABLE_TABLET | Freq: Once | ORAL | Status: AC
  Administered 2024-03-08: 324 mg via ORAL
  Filled 2024-03-08: qty 4

## 2024-03-08 MED ORDER — DIAZEPAM 5 MG PO TABS
5.0000 mg | ORAL_TABLET | Freq: Four times a day (QID) | ORAL | Status: DC | PRN
  Administered 2024-03-08 – 2024-03-12 (×9): 5 mg via ORAL
  Filled 2024-03-08 (×9): qty 1

## 2024-03-08 MED ORDER — HYDRALAZINE HCL 20 MG/ML IJ SOLN: 20.0000 mg | INTRAMUSCULAR | Status: DC | PRN

## 2024-03-08 MED ORDER — PIPERACILLIN-TAZOBACTAM 3.375 G IVPB
3.3750 g | Freq: Three times a day (TID) | INTRAVENOUS | Status: DC
  Administered 2024-03-08 – 2024-03-10 (×6): 3.375 g via INTRAVENOUS
  Filled 2024-03-08 (×6): qty 50

## 2024-03-08 MED ORDER — ORAL CARE MOUTH RINSE: 15.0000 mL | OROMUCOSAL | Status: DC | PRN

## 2024-03-08 MED ORDER — ORAL CARE MOUTH RINSE
15.0000 mL | OROMUCOSAL | Status: DC
  Administered 2024-03-08 – 2024-03-12 (×33): 15 mL via OROMUCOSAL

## 2024-03-08 MED ORDER — ROSUVASTATIN CALCIUM 10 MG PO TABS
40.0000 mg | ORAL_TABLET | Freq: Every day | ORAL | Status: DC
  Administered 2024-03-09 – 2024-03-12 (×4): 40 mg via ORAL
  Filled 2024-03-08 (×3): qty 4
  Filled 2024-03-08: qty 2
  Filled 2024-03-08: qty 4

## 2024-03-08 MED ORDER — DIAZEPAM 5 MG/ML IJ SOLN
INTRAMUSCULAR | Status: AC
  Administered 2024-03-08: 5 mg via INTRAVENOUS
  Filled 2024-03-08: qty 2

## 2024-03-08 MED ORDER — FAMOTIDINE 20 MG PO TABS
40.0000 mg | ORAL_TABLET | Freq: Every day | ORAL | Status: DC
  Administered 2024-03-08 – 2024-03-12 (×5): 40 mg via ORAL
  Filled 2024-03-08 (×5): qty 2

## 2024-03-08 MED ORDER — HEPARIN BOLUS VIA INFUSION
3500.0000 [IU] | Freq: Once | INTRAVENOUS | Status: AC
  Administered 2024-03-08: 3500 [IU] via INTRAVENOUS
  Filled 2024-03-08: qty 3500

## 2024-03-08 MED ORDER — BUDESONIDE 0.5 MG/2ML IN SUSP
0.5000 mg | Freq: Two times a day (BID) | RESPIRATORY_TRACT | Status: DC
  Administered 2024-03-08 – 2024-03-10 (×4): 0.5 mg via RESPIRATORY_TRACT
  Filled 2024-03-08 (×4): qty 2

## 2024-03-08 MED ORDER — SODIUM CHLORIDE 0.9 % IV BOLUS
500.0000 mL | Freq: Once | INTRAVENOUS | Status: AC
  Administered 2024-03-08: 500 mL via INTRAVENOUS

## 2024-03-08 MED ORDER — MIRTAZAPINE 15 MG PO TABS
45.0000 mg | ORAL_TABLET | Freq: Every day | ORAL | Status: DC
  Administered 2024-03-08 – 2024-03-11 (×4): 45 mg via ORAL
  Filled 2024-03-08 (×4): qty 3

## 2024-03-08 MED ORDER — HEPARIN (PORCINE) 25000 UT/250ML-% IV SOLN
1250.0000 [IU]/h | INTRAVENOUS | Status: DC
  Administered 2024-03-08: 700 [IU]/h via INTRAVENOUS
  Administered 2024-03-09: 1100 [IU]/h via INTRAVENOUS
  Filled 2024-03-08 (×2): qty 250

## 2024-03-08 MED ORDER — ONDANSETRON HCL 4 MG/2ML IJ SOLN: 4.0000 mg | Freq: Four times a day (QID) | INTRAMUSCULAR | Status: DC | PRN

## 2024-03-08 MED ORDER — DIAZEPAM 5 MG/ML IJ SOLN: 5.0000 mg | Freq: Once | INTRAMUSCULAR | Status: AC

## 2024-03-08 MED ORDER — CHLORHEXIDINE GLUCONATE CLOTH 2 % EX PADS
6.0000 | MEDICATED_PAD | Freq: Every day | CUTANEOUS | Status: DC
  Administered 2024-03-08 – 2024-03-12 (×5): 6 via TOPICAL

## 2024-03-08 MED ORDER — OXYCODONE HCL 5 MG PO TABS
5.0000 mg | ORAL_TABLET | Freq: Once | ORAL | Status: AC
  Administered 2024-03-08: 5 mg via ORAL
  Filled 2024-03-08: qty 1

## 2024-03-08 MED ORDER — IPRATROPIUM-ALBUTEROL 0.5-2.5 (3) MG/3ML IN SOLN
3.0000 mL | Freq: Four times a day (QID) | RESPIRATORY_TRACT | Status: DC
  Administered 2024-03-08 – 2024-03-12 (×16): 3 mL via RESPIRATORY_TRACT
  Filled 2024-03-08 (×16): qty 3

## 2024-03-08 NOTE — ED Provider Notes (Signed)
 [facility] Provider Note    Event Date/Time   First MD Initiated Contact with Patient 03/08/24 [id number]     (approximate)   History   Shortness of Breath   HPI  Michelle [name] is a 63 y.o. female with history of MRSA and pseudomonal pneumonia, atypical pneumonia, COPD, chronic trach since 2019 vent dependent at home, coronary disease with stent placement, diabetes who comes in with concerns for increasing shortness of breath.  I reviewed the last admission summary where patient had a bronchoscopy done and placed on Zosyn  and Augmentin  for a period treatment.  Patient was discharged back home.  Patient has had increasing shortness of breath over the past 24 hours that is worse with certain movements in the bed.  She also reports some pain in her bilateral legs.  However this has been going on for months.  She is not on any anticoagulation.  She is on her baseline 8 L.  EMS did report heart rates in the 130s. She denies any falls or severe pain.  Does report an increase in cough.   Physical Exam   Triage Vital Signs: ED Triage Vitals  Encounter Vitals Group     BP      Girls Systolic BP Percentile      Girls Diastolic BP Percentile      Boys Systolic BP Percentile      Boys Diastolic BP Percentile      Pulse      Resp      Temp      Temp src      SpO2      Weight      Height      Head Circumference      Peak Flow      Pain Score      Pain Loc      Pain Education      Exclude from Growth Chart     Most recent vital signs: Vitals:   03/08/24 0930 03/08/24 0931  BP:  (!) 143/84  Pulse:  (!) 105  Resp:  18  Temp:  98.7 F (37.1 C)  SpO2: 98% 99%     General: Awake, no distress.  CV:  Good peripheral perfusion.  Resp:  Normal effort.  Abd:  No distention.  Soft nontender Other:  No swelling in legs.  No obvious calf tenderness but she just reports generalized aching   ED Results / Procedures / Treatments   Labs (all labs ordered are  listed, but only abnormal results are displayed) Labs Reviewed  CULTURE, BLOOD (ROUTINE X 2)  CULTURE, BLOOD (ROUTINE X 2)  RESP PANEL BY RT-PCR (RSV, FLU A&B, COVID)  [id number]  CBC WITH DIFFERENTIAL/PLATELET  COMPREHENSIVE METABOLIC PANEL WITH GFR  LACTIC ACID, PLASMA  LACTIC ACID, PLASMA  BRAIN NATRIURETIC PEPTIDE  LIPASE, BLOOD  URINALYSIS, ROUTINE W REFLEX MICROSCOPIC  CK  TROPONIN I (HIGH SENSITIVITY)  TROPONIN I (HIGH SENSITIVITY)     EKG  My interpretation of EKG:  Sinus rate of 99 without any ST elevation, no T wave inversions QTc is 551.  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  Similar to prior  RADIOLOGY I have reviewed the xray personally and interpreted possible pneumonia in the right lower base   PROCEDURES:  Critical Care performed: Yes, see critical care procedure note(s)  .1-3 Lead EKG Interpretation  Performed by: Ernest Ronal BRAVO, MD Authorized by: Ernest Ronal BRAVO, MD  Interpretation: abnormal     ECG rate:  105   ECG rate assessment: tachycardic     Rhythm: sinus tachycardia     Ectopy: none     Conduction: normal   .Critical Care  Performed by: Ernest Ronal BRAVO, MD Authorized by: Ernest Ronal BRAVO, MD   Critical care provider statement:    Critical care time (minutes):  30   Critical care was necessary to treat or prevent imminent or life-threatening deterioration of the following conditions:  Cardiac failure   Critical care was time spent personally by me on the following activities:  Development of treatment plan with patient or surrogate, discussions with consultants, evaluation of patient's response to treatment, examination of patient, ordering and review of laboratory studies, ordering and review of radiographic studies, ordering and performing treatments and interventions, pulse oximetry, re-evaluation of patient's condition and review of old charts    MEDICATIONS ORDERED IN ED: Medications  aspirin  chewable tablet 324 mg  (has no administration in time range)  heparin  bolus via infusion 3,500 Units (has no administration in time range)  heparin  ADULT infusion 100 units/mL (25000 units/250mL) (has no administration in time range)  sodium chloride  0.9 % bolus 500 mL (500 mLs Intravenous New Bag/Given 03/08/24 1150)  oxyCODONE  (Oxy IR/ROXICODONE ) immediate release tablet 5 mg (5 mg Oral Given 03/08/24 1153)     IMPRESSION / MDM / ASSESSMENT AND PLAN / ED COURSE  I reviewed the triage vital signs and the nursing notes.   Patient's presentation is most consistent with acute presentation with potential threat to life or bodily function.   Patient comes in with known trach with worsening shortness of breath.  Workup done to evaluate for COVID, pneumonia, consider pulmonary embolism and will get ultrasound to evaluate for any DVT, although she does report a worsening cough and this has been ongoing for the past 24 hours with history of frequent pneumonias.  9:52 AM respiratory come by to evaluate patient she is on her baseline oxygen so they are able to just plugging her device.  They did suction her out with some yellowish sputum.  11:17 AM according to patient's family member who is now at bedside her daughter does report some increasing mucus production and having difficulty suctioning out all the mucus.  Reports that it is thicker than normal which is contributed to the shortness of breath. There has been a delay in care secondary to difficulties with IV access but IV has been placed will give some fluids and patient's requesting some pain medications for her new bilateral leg pain.  Patient ordered a dose of oxycodone .  CK was normal.  Hemoglobin was stable at baseline white count is normal CMP was reassuring troponin is elevated at 389 which is atypical for patient she has no chest pain only shortness of breath.  Ultrasound is negative for DVT.  I will add on D-dimer just given the elevated troponin to see if this  could be a PE causing troponin elevation however I have placed patient on heparin  given aspirin  given she denies any concerns for bleeding.  I have messaged cardiology who will come to evaluate patient Dr. Arleene discussed with the case with ICU doctor Dr. Isaiah given patient is on a ventilator at baseline.  Patient will be admitted to the ICU pending D-dimer, cardiology evaluation.  At this time we will hold on antibiotics given her frequent history of pneumonias and right now she is afebrile with normal white count Necko monitor patient to  decide if antibiotic should be started.  The patient is on the cardiac monitor to evaluate for evidence of arrhythmia and/or significant heart rate changes.      FINAL CLINICAL IMPRESSION(S) / ED DIAGNOSES   Final diagnoses:  Shortness of breath  Elevated troponin  Chronic respiratory failure with hypoxia and hypercapnia (HCC)  Tracheostomy dependent (HCC)  NSTEMI (non-ST elevated myocardial infarction) (HCC)     Rx / DC Orders   ED Discharge Orders     None        Note:  This document was prepared using Dragon voice recognition software and may include unintentional dictation errors.   Ernest Ronal BRAVO, MD 03/08/24 [id number]

## 2024-03-08 NOTE — Plan of Care (Signed)
  Problem: Clinical Measurements: Goal: Diagnostic test results will improve Outcome: Progressing Goal: Respiratory complications will improve Outcome: Progressing   Problem: Skin Integrity: Goal: Risk for impaired skin integrity will decrease Outcome: Progressing   

## 2024-03-08 NOTE — Consult Note (Signed)
 Cardiology Consultation   Patient ID: TROY HARTZOG MRN: [id number]; DOB: 06/13/1960  Admit date: 03/08/2024 Date of Consult: 03/08/2024  PCP:  Adina Buel HERO, MD   [facility] Providers Cardiologist:  None     Patient Profile: Michelle [name] is a 63 y.o. 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 Ernest.  History of Present Illness: Ms. Ramp describes a 1 to 2-day history of progressive shortness of breath.  This is even occurring with minimal movements in the bed.  She has not had an increase in oxygen requirements.  She has described an increase in cough as well as more sputum production.  She denies any chest discomfort; she says that during her MI back in 2009 that was the presenting symptom.  She says that this sensation does not feel similar.  Initial troponin 389.  BNP 163.  Lactic acid 1.3.  Hemoglobin 11.7, PLT 275.  Creatinine 0.80.  Venous Dopplers did not show any evidence of DVT.  CXR without obvious consolidation and no significant pleural effusions.   Past Medical History:  Diagnosis Date   Allergy    Anxiety    Asthma    CHF (congestive heart failure) (HCC)    Cocaine abuse (HCC)    COPD (chronic obstructive pulmonary disease) (HCC)    Coronary artery disease    Emphysema/COPD    Hepatitis C    High cholesterol    Hypertension    Pneumothorax    Tobacco abuse     Past Surgical History:  Procedure Laterality Date   CARDIAC CATHETERIZATION     CHEST TUBE INSERTION     IR GASTROSTOMY TUBE MOD SED  10/31/2017   TRACHEOSTOMY TUBE PLACEMENT N/A 10/17/2017   Procedure: TRACHEOSTOMY;  Surgeon: Milissa Hamming, MD;  Location: [facility] ORS;  Service: ENT;  Laterality: N/A;     Scheduled Meds:  aspirin   324 mg Oral Once   heparin   3,500 Units Intravenous Once   Continuous  Infusions:  heparin      PRN Meds:   Allergies:    Allergies  Allergen Reactions   Lorazepam      Makes anxiety worse  Other Reaction(s): Makes anxiety worse   Lisinopril  Cough   Amoxicillin      Patient has tolerated cephalosporins in the past    Social History:   Social History   Socioeconomic History   Marital status: Single    Spouse name: Not on file   Number of children: Not on file   Years of education: Not on file   Highest education level: Not on file  Occupational History   Not on file  Tobacco Use   Smoking status: Former    Current packs/day: 0.00    Average packs/day: 0.1 packs/day for 41.0 years (4.1 ttl pk-yrs)    Types: Cigarettes    Start date: 06/09/1976    Quit date: 06/09/2017    Years since quitting: 6.7   Smokeless tobacco: Never  Vaping Use   Vaping status: Never Used  Substance and Sexual Activity   Alcohol use: No   Drug use: Not Currently    Types: Crack cocaine    Comment: Past cocaine use.  Quit 04/2017   Sexual activity: Not Currently    Partners: Male    Birth control/protection: Post-menopausal  Other Topics Concern  Not on file  Social History Narrative   Not on file   Social Drivers of Health   Financial Resource Strain: Medium Risk (06/29/2017)   Overall Financial Resource Strain (CARDIA)    Difficulty of Paying Living Expenses: Somewhat hard  Food Insecurity: No Food Insecurity (12/14/2023)   Hunger Vital Sign    Worried About Running Out of Food in the Last Year: Never true    Ran Out of Food in the Last Year: Never true  Transportation Needs: No Transportation Needs (12/14/2023)   PRAPARE - Administrator, Civil Service (Medical): No    Lack of Transportation (Non-Medical): No  Physical Activity: Inactive (06/29/2017)   Exercise Vital Sign    Days of Exercise per Week: 0 days    Minutes of Exercise per Session: 0 min  Stress: Stress Concern Present (06/29/2017)   [employer] of Occupational Health -  Occupational Stress Questionnaire    Feeling of Stress : To some extent  Social Connections: Unknown (06/29/2017)   Social Connection and Isolation Panel    Frequency of Communication with Friends and Family: Patient declined    Frequency of Social Gatherings with Friends and Family: Patient declined    Attends Religious Services: Patient declined    Database administrator or Organizations: Patient declined    Attends Banker Meetings: Patient declined    Marital Status: Patient declined  Intimate Partner Violence: Not At Risk (12/14/2023)   Humiliation, Afraid, Rape, and Kick questionnaire    Fear of Current or Ex-Partner: No    Emotionally Abused: No    Physically Abused: No    Sexually Abused: No    Family History:    Family History  Problem Relation Age of Onset   Lung cancer Mother    Asthma Mother    CVA Father    CAD Father    Stroke Father    Breast cancer Maternal Aunt 38   COPD Sister      ROS:  Please see the history of present illness.   All other ROS reviewed and negative.     Physical Exam/Data: Vitals:   03/08/24 0927 03/08/24 0930 03/08/24 0931 03/08/24 1200  BP:   (!) 143/84 124/80  Pulse:   (!) 105 73  Resp:   18 20  Temp:   98.7 F (37.1 C)   TempSrc:   Oral   SpO2:  98% 99% 100%  Weight: 72.6 kg     Height: 4' 10 (1.473 m)      No intake or output data in the 24 hours ending 03/08/24 1351    03/08/2024    9:27 AM 12/20/2023    5:00 AM 12/18/2023    3:18 AM  Last 3 Weights  Weight (lbs) 160 lb 164 lb 7.4 oz 162 lb 14.7 oz  Weight (kg) 72.576 kg 74.6 kg 73.9 kg     Body mass index is 33.44 kg/m.  General: No acute distress Neck: Unable to assess JVP given body habitus Cardiac: normal S1, S2; RRR; no murmur  Lungs: CTAB Abd: soft, nontender Ext: warm and well-perfused, no edema Psych: appropriate affect   EKG:  The EKG was personally reviewed and demonstrates: Sinus tachycardia with bifascicular block.  No ST elevations.   Nonspecific ST-T abnormalities, but no clear ischemic changes.  Looks similar to prior ECG 12/2023  Relevant CV Studies: -TTE 09/2023 grossly normal biventricular function with grade 1 diastolic dysfunction.  No hemodynamically significant valvular disease. -CTPA 08/2023  severe aortic atherosclerosis and severe three-vessel coronary calcium  with apparent stent in LCx -Reported BMS LCx 2009 in setting of STEMI  Laboratory Data: High Sensitivity Troponin:   Recent Labs  Lab 03/08/24 1234  TROPONINIHS 389*     Chemistry Recent Labs  Lab 03/08/24 1234  NA 140  K 4.1  CL 103  CO2 25  GLUCOSE 230*  BUN 18  CREATININE 0.80  CALCIUM  9.4  GFRNONAA >60  ANIONGAP 12    Recent Labs  Lab 03/08/24 1234  PROT 8.2*  ALBUMIN  3.5  AST 22  ALT 12  ALKPHOS 86  BILITOT 0.6   Lipids No results for input(s): CHOL, TRIG, HDL, LABVLDL, LDLCALC, CHOLHDL in the last 168 hours.  Hematology Recent Labs  Lab 03/08/24 1234  WBC 10.0  RBC 4.61  HGB 11.7*  HCT 38.6  MCV 83.7  MCH 25.4*  MCHC 30.3  RDW 14.5  PLT 275   Thyroid  No results for input(s): TSH, FREET4 in the last 168 hours.  BNP Recent Labs  Lab 03/08/24 1234  BNP 162.6*    DDimer No results for input(s): DDIMER in the last 168 hours.  Radiology/Studies:  US  Venous Img Lower Bilateral Result Date: 03/08/2024 CLINICAL DATA:  Lower extremity pain and shortness of breath. EXAM: BILATERAL LOWER EXTREMITY VENOUS DOPPLER ULTRASOUND TECHNIQUE: Gray-scale sonography with graded compression, as well as color Doppler and duplex ultrasound were performed to evaluate the lower extremity deep venous systems from the level of the common femoral vein and including the common femoral, femoral, profunda femoral, popliteal and calf veins including the posterior tibial, peroneal and gastrocnemius veins when visible. The superficial great saphenous vein was also interrogated. Spectral Doppler was utilized to evaluate flow at rest  and with distal augmentation maneuvers in the common femoral, femoral and popliteal veins. COMPARISON:  11/05/2013 FINDINGS: RIGHT LOWER EXTREMITY Common Femoral Vein: No evidence of thrombus. Normal compressibility, respiratory phasicity and response to augmentation. Saphenofemoral Junction: No evidence of thrombus. Normal compressibility and flow on color Doppler imaging. Profunda Femoral Vein: No evidence of thrombus. Normal compressibility and flow on color Doppler imaging. Femoral Vein: No evidence of thrombus. Normal compressibility, respiratory phasicity and response to augmentation. Popliteal Vein: No evidence of thrombus. Normal compressibility, respiratory phasicity and response to augmentation. Calf Veins: No evidence of thrombus. Normal compressibility and flow on color Doppler imaging. Superficial Great Saphenous Vein: No evidence of thrombus. Normal compressibility. Venous Reflux:  None. Other Findings: No evidence of superficial thrombophlebitis or abnormal fluid collection. LEFT LOWER EXTREMITY Common Femoral Vein: No evidence of thrombus. Normal compressibility, respiratory phasicity and response to augmentation. Saphenofemoral Junction: No evidence of thrombus. Normal compressibility and flow on color Doppler imaging. Profunda Femoral Vein: No evidence of thrombus. Normal compressibility and flow on color Doppler imaging. Femoral Vein: No evidence of thrombus. Normal compressibility, respiratory phasicity and response to augmentation. Popliteal Vein: No evidence of thrombus. Normal compressibility, respiratory phasicity and response to augmentation. Calf Veins: No evidence of thrombus. Normal compressibility and flow on color Doppler imaging. Superficial Great Saphenous Vein: No evidence of thrombus. Normal compressibility. Venous Reflux:  None. Other Findings: No evidence of superficial thrombophlebitis or abnormal fluid collection. IMPRESSION: No evidence of deep venous thrombosis in either lower  extremity. Electronically Signed   By: Marcey Moan M.D.   On: 03/08/2024 11:41   DG Chest Portable 1 View Result Date: 03/08/2024 CLINICAL DATA:  sob EXAM: PORTABLE CHEST 1 VIEW COMPARISON:  July 16th 2025, December 17, 2023, December 14, 2023 FINDINGS:  The cardiomediastinal silhouette is unchanged in contour.Tracheostomy. Underlying emphysematous changes. No pleural effusion. No pneumothorax. Patchy reticular nodularity along the RIGHT lower lateral lung base. IMPRESSION: Patchy reticular nodularity along the RIGHT lower lateral lung base. Differential considerations include atelectasis, aspiration or infection. Given underlying emphysema, recommend evaluation for candidacy for annual lung cancer screening CT. Electronically Signed   By: Corean Salter M.D.   On: 03/08/2024 10:11     Assessment and Plan: CAD status post BMS LCx 2009 in setting of STEMI NSTEMI Multivessel coronary artery calcifications Severe aortic atherosclerosis Acute hypoxemic respiratory failure Elevated BNP Patient presents with 1 to 2-day history of worsening dyspnea with increased cough and sputum production per her report.  Symptoms are distinct from her prior MI in 2009.  Initial troponin mildly elevated to 389 with mild elevation of BNP to 163.  She is on her baseline oxygen level currently. Unclear whether this represents demand ischemia in the setting of known CAD versus plaque rupture event.  She is warm and well-perfused, no signs of low output. No urgent/emergent indications for cardiac catheterization currently.  Recommendations: - Agree with ASA load 324 mg followed by ASA 81 mg daily - Given prior CAD, unclear currently whether this is demand ischemia versus plaque rupture event.  Reasonable to continue heparin  gtt for time being. - Trend troponins to peak; if trajectory is relatively flat, then this is likely just demand ischemia in setting of known CAD and heparin  gtt would not need to be continued - If  troponin trend is suggestive of plaque rupture event, then we should add P2Y12 inhibitor since she is unlikely to be CABG candidate - Obtain echocardiogram - Repeat lipids and start high intensity statin.  Her LDL goal should be less than 55 given prior ischemic events and widespread coronary and aortic atherosclerosis on prior CT scan - Further plans pending clinical course; can make n.p.o. at midnight for consideration of cardiac catheterization should clinical course be suggestive - Treatment of other causes of dyspnea/hypoxemia per primary team    For questions or updates, please contact [facility] Please consult [email or web address] for contact info under    Signed, Caron Poser, MD  03/08/2024 1:51 PM

## 2024-03-08 NOTE — Progress Notes (Signed)
 Patient experiencing anxiety attack. HR, BP and RR increasing in response. RN and NT providing Therapeutic interventions to ease anxiety without relief. MD to Order Anti-anxiety Medication to assist.  Diazepam  given as ordered by MD with relief of symptoms

## 2024-03-08 NOTE — ED Notes (Signed)
 Advised nurse that patient has ready bed

## 2024-03-08 NOTE — ED Triage Notes (Incomplete)
 Pt via [facility] from home. Pt c/o SOB for the past 24 hours and more with exertion when she stand to get on her bedside commode. Pt also c/o bilateral leg pain. Pt is A&OX4 and able to answer question appropriately.   EMS reports initial HR 130s but now 107, 185/105 BP, pt baseline 8L via trach ETCO2

## 2024-03-08 NOTE — H&P (Signed)
 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, HTN, HLD, CHF, CAD s/p STEMI s/ stent 2009, DM type 2 w/ insulin  dependence, Hepatitis C, Former tobacco and cocaine use, Anxiety/Depression, and Obesity who presented to the ED with chief complaints of increased shortness of breath elevated TROP DX NSTEMI Started on heparin      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, HTN, HLD, CHF, CAD s/p STEMI s/ stent 2009, DM type 2 w/ insulin  dependence, Hepatitis C, Former tobacco and cocaine use, Anxiety/Depression, and Obesity    Significant Hospital Events   RECURRENT ADMISSIONS in the PAST   Consults:  None   Procedures:  None   Interim History / Subjective:        Antimicrobials:  Zosyn       Significant Hospital Events: Including procedures, antibiotic start and stop dates in addition to other pertinent events   10/5 Admit to ICU on home ventilator with acute on chronic respiratory failure iso suspected Pneumonia, AECOPD and UTI  Trache site clean/ #6 Shiley tracheostomy, on home VENT          Objective   Blood pressure 124/80, pulse 73, temperature 98.7 F (37.1 C), temperature source Oral, resp. rate 20, height 4' 10 (1.473 m), weight 72.6 kg, last menstrual period 03/06/2005, SpO2 100%.    Vent Mode: SIMV FiO2 (%):  [40 %] 40 % Set Rate:  [16 bmp] 16 bmp Vt Set:  [400 mL] 400 mL PEEP:  [5 cmH20] 5 cmH20 Pressure Support:  [10 cmH20] 10 cmH20  No intake or output data in the 24 hours ending 03/08/24 1437 Filed Weights   03/08/24 0927  Weight: 72.6 kg       Review of Systems: Gen:  Denies  fever, sweats, chills weight  loss  HEENT: Denies blurred vision, double vision, ear pain, eye pain, hearing loss, nose bleeds, sore throat Cardiac:  No dizziness, chest pain or heaviness, chest tightness,edema, No JVD Resp:   No cough, -sputum production, +shortness of breath,-wheezing, -hemoptysis,  Other:  All other systems negative   Physical Examination:   General Appearance: No distress  EYES PERRLA, EOM intact.   NECK Supple, No JVD Pulmonary: normal breath sounds, No wheezing.  CardiovascularNormal S1,S2.  No m/r/g.   Abdomen: Benign, Soft, non-tender. Neurology UE/LE 5/5 strength, no focal deficits Ext pulses intact, cap refill intact ALL OTHER ROS ARE NEGATIVE    Labs/imaging that I havepersonally reviewed  (right click and Reselect all SmartList Selections daily)       ASSESSMENT AND PLAN SYNOPSIS  Acute on Chronic Hypoxic & Hypercapnic Respiratory failure in the setting of ... Acute COPD Exacerbation  Suspected Pneumonia PMHx: COPD/Emphysema/Asthma with chronic tracheostomy and home vent.  -Vent support per home setting -titrate FiO2, PEEP to maintain O2 sat >90%  -Lung protective ventilation  -PRN Chest X-ray & ABG -PRN and scheduled bronchodilators, hypertonic saline -Start systemic steroid IV ABX   NSTEMI Heparin  infusion Cardiology consulted -Prn Valium  -Resume outpatient Klonopin     ENDO - ICU hypoglycemic\Hyperglycemia protocol -check FSBS per protocol   GI GI PROPHYLAXIS as indicated NUTRITIONAL STATUS DIET-->as tolerated Constipation protocol as indicated   ELECTROLYTES -follow labs as needed -replace as  needed -pharmacy consultation and following  RESTRICTIVE TRANSFUSION PROTOCOL TRANSFUSION  IF HGB<7  or ACTIVE BLEEDING OR DX of ACUTE CORONARY SYNDROMES         Best practice (right click and Reselect all SmartList Selections daily)  VAP protocol (if indicated): Yes DVT prophylaxis: Systemic AC GI prophylaxis: H2B Mobility:  bed rest  Code  Status:  FULL Disposition:ICU  Labs   CBC: Recent Labs  Lab 03/08/24 1234  WBC 10.0  NEUTROABS 7.8*  HGB 11.7*  HCT 38.6  MCV 83.7  PLT 275    Basic Metabolic Panel: Recent Labs  Lab 03/08/24 1234  NA 140  K 4.1  CL 103  CO2 25  GLUCOSE 230*  BUN 18  CREATININE 0.80  CALCIUM  9.4   GFR: Estimated Creatinine Clearance: 61.7 mL/min (by C-G formula based on SCr of 0.8 mg/dL). Recent Labs  Lab 03/08/24 1234  WBC 10.0  LATICACIDVEN 1.3    Liver Function Tests: Recent Labs  Lab 03/08/24 1234  AST 22  ALT 12  ALKPHOS 86  BILITOT 0.6  PROT 8.2*  ALBUMIN  3.5   Recent Labs  Lab 03/08/24 1234  LIPASE 41   No results for input(s): AMMONIA in the last 168 hours.  ABG    Component Value Date/Time   PHART 7.31 (L) 09/30/2021 0500   PCO2ART 46 09/30/2021 0500   PO2ART 155 (H) 09/30/2021 0500   HCO3 24.8 08/29/2023 0307   ACIDBASEDEF 1.5 08/29/2023 0307   O2SAT 97.5 08/29/2023 0307     Coagulation Profile: No results for input(s): INR, PROTIME in the last 168 hours.  Cardiac Enzymes: Recent Labs  Lab 03/08/24 1234  CKTOTAL 180    HbA1C: Hemoglobin A1C  Date/Time Value Ref Range Status  06/29/2013 04:02 AM 6.0 4.2 - 6.3 % Final    Comment:    The American Diabetes Association recommends that a primary goal of therapy should be <7% and that physicians should reevaluate the treatment regimen in patients with HbA1c values consistently >8%.    Hgb A1c MFr Bld  Date/Time Value Ref Range Status  08/29/2023 01:01 AM 9.4 (H) 4.8 - 5.6 % Final    Comment:    (NOTE) Pre diabetes:          5.7%-6.4%  Diabetes:              >6.4%  Glycemic control for   <7.0% adults with diabetes   02/19/2023 04:33 AM 10.3 (H) 4.8 - 5.6 % Final    Comment:    (NOTE)         Prediabetes: 5.7 - 6.4         Diabetes: >6.4         Glycemic control for adults with diabetes: <7.0     CBG: No results for input(s): GLUCAP in the last 168 hours.   Past  Medical History:  She,  has a past medical history of Allergy, Anxiety, Asthma, CHF (congestive heart failure) (HCC), Cocaine abuse (HCC), COPD (chronic obstructive pulmonary disease) (HCC), Coronary artery disease, Emphysema/COPD, Hepatitis C, High cholesterol, Hypertension, Pneumothorax, and Tobacco abuse.   Surgical History:   Past Surgical History:  Procedure Laterality Date   CARDIAC CATHETERIZATION     CHEST TUBE INSERTION     IR GASTROSTOMY TUBE MOD SED  10/31/2017   TRACHEOSTOMY TUBE PLACEMENT N/A 10/17/2017   Procedure: TRACHEOSTOMY;  Surgeon: Milissa Hamming, MD;  Location: [facility] ORS;  Service: ENT;  Laterality: N/A;     Social History:  reports that she quit smoking about 6 years ago. Her smoking use included cigarettes. She started smoking about 47 years ago. She has a 4.1 pack-year smoking history. She has never used smokeless tobacco. She reports that she does not currently use drugs after having used the following drugs: Crack cocaine. She reports that she does not drink alcohol.   Family History:  Her family history includes Asthma in her mother; Breast cancer (age of onset: 46) in her maternal aunt; CAD in her father; COPD in her sister; CVA in her father; Lung cancer in her mother; Stroke in her father.   Allergies Allergies  Allergen Reactions   Lorazepam      Makes anxiety worse  Other Reaction(s): Makes anxiety worse   Lisinopril  Cough   Amoxicillin      Patient has tolerated cephalosporins in the past     Home Medications  Prior to Admission medications   Medication Sig Start Date End Date Taking? Authorizing Provider  acetaminophen  (TYLENOL ) 325 MG tablet Take 2 tablets (650 mg total) by mouth every 6 (six) hours as needed for mild pain (pain score 1-3) (or Fever >/= 101). 04/24/23   Dorinda Drue DASEN, MD  albuterol  (PROVENTIL ) (2.5 MG/3ML) 0.083% nebulizer solution Take 3 mLs (2.5 mg total) by nebulization every 6 (six) hours as needed for wheezing or  shortness of breath. 03/27/23   Tamea Dedra CROME, MD  albuterol  (VENTOLIN  HFA) [phone] Base) MCG/ACT inhaler Inhale 2 puffs into the lungs every 6 (six) hours as needed for wheezing or shortness of breath. 04/02/22   Parrett, Madelin RAMAN, NP  amLODipine  (NORVASC ) 5 MG tablet Take 5 mg by mouth daily. 08/27/21   [provider]  aspirin  81 MG chewable tablet Chew 81 mg by mouth daily.     [provider]  azelastine  (ASTELIN ) 0.1 % nasal spray SMARTSIG:1-2 Spray(s) Both Nares Every 12 Hours PRN 09/22/20   [provider]  Blood Glucose Monitoring Suppl ([employer] BLOOD GLUCOSE METER) w/Device KIT Use as directed to monitor blood glucose up to qid 02/22/23   Alexander, Natalie, DO  cefdinir (OMNICEF) 300 MG capsule Take 1 capsule (300 mg total) by mouth 2 (two) times daily. 03/02/24   Brimage, Vondra, DO  cetirizine  (ZYRTEC ) 10 MG tablet Take 1 tablet (10 mg total) by mouth daily as needed for allergies. Home med. 12/20/23   Awanda City, MD  clonazePAM  (KLONOPIN ) 0.5 MG tablet Take 0.5 mg by mouth in the morning, at noon, and at bedtime.    [provider]  cloNIDine  (CATAPRES  - DOSED IN MG/24 HR) 0.1 mg/24hr patch Place 0.1 mg onto the skin once a week.    [provider]  docusate sodium  (COLACE) 100 MG capsule Take 2 capsules (200 mg total) by mouth daily as needed for mild constipation. 04/24/23   Dorinda Drue DASEN, MD  famotidine  (PEPCID ) 20 MG tablet Take 1 tablet by mouth daily as needed for heartburn 09/24/23   [provider]  fluconazole  (DIFLUCAN ) 150 MG tablet Take 1 tablet (150 mg total) by mouth once a week. 03/02/24   Brimage, Vondra, DO  fluticasone  (FLONASE ) 50 MCG/ACT nasal spray Place 1 spray into both nostrils daily as needed for allergies or rhinitis. Home med. 12/20/23   Awanda City, MD  guaiFENesin  (MUCINEX ) 600 MG 12 hr tablet Take 600 mg by mouth 2 (two) times daily.    [provider]  hydrOXYzine  (VISTARIL ) 25 MG capsule Take 25 mg by  mouth every 6 (six) hours  as needed. 10/01/22   [provider]  ipratropium-albuterol  (DUONEB) 0.5-2.5 (3) MG/3ML SOLN Take 3 mLs by nebulization every 6 (six) hours as needed (for shortness of breath). 06/17/23   Camdyn Laden, MD  LANTUS  SOLOSTAR 100 UNIT/ML Solostar Pen Inject 40 Units into the skin at bedtime. 01/03/23   [provider]  mirtazapine  (REMERON ) 30 MG tablet Take 1 tablet by mouth every night at bedtime 10/17/23   [provider]  Multiple Vitamin (MULTIVITAMIN WITH MINERALS) TABS tablet Take 1 tablet by mouth daily. 12/20/23   Awanda City, MD  naloxone  (NARCAN ) nasal spray 4 mg/0.1 mL Place 1 spray into the nose once as needed for up to 1 dose. Home med. 12/20/23   Awanda City, MD  NOVOLOG  FLEXPEN 100 UNIT/ML FlexPen Inject 5-10 Units into the skin 3 (three) times daily with meals. Sliding scale mealtime insulin . 03/01/21   [provider]  nystatin  ointment (MYCOSTATIN ) Apply 1 Application topically 3 (three) times daily as needed. Home med. 12/20/23   Awanda City, MD  oxyCODONE -acetaminophen  (PERCOCET/ROXICET) 5-325 MG tablet Take 1 tablet by mouth 3 (three) times daily as needed for pain. 09/07/21   [provider]  PARoxetine  (PAXIL ) 30 MG tablet Take 30 mg by mouth daily. 09/19/23   [provider]  polyethylene glycol powder (GLYCOLAX /MIRALAX ) 17 GM/SCOOP powder Take 17 g by mouth daily as needed. Home med. 12/20/23   Awanda City, MD  PULMICORT  0.5 MG/2ML nebulizer solution Take 0.5 mg by nebulization every 12 (twelve) hours. 02/13/21   [provider]  terbinafine (LAMISIL) 1 % cream Apply 1 application  topically 2 (two) times daily. To affected skin, for 1 week after resolution of rash. # 30 gm, 1 refill.    [provider]  TRULICITY 4.5 MG/0.5ML SOPN Inject 4.5 mg into the skin once a week. 09/28/21   [provider]  Vitamin D , Ergocalciferol , (DRISDOL ) 1.25 MG (50000 UNIT) CAPS capsule Take 50,000 Units by mouth  every 7 (seven) days.    [provider]  fluconazole  (DIFLUCAN ) 150 MG tablet Take 1 tablet day 1 repeat in 1 week 11/15/20   Moye, [place] , MD      Critical Care Time devoted to patient care services described in this note is 75 minutes.     Nickolas Alm Cellar, M.D.  [facility]  Medical Director [facility]

## 2024-03-08 NOTE — Progress Notes (Signed)
 The hospital's IV Nurse received a 2nd consult, this time to restart the patient's IV;  pt had been seen in the ER this morning for an initial consult for iv access;  pt with very poor, tiny veins with scar tissue noted;staff had also attempted without success;  a PIV was placed on the 2nd attempt using ultrasound; pt would benefit from central access; have sent secure chats to RN caring for the pt.

## 2024-03-08 NOTE — ED Notes (Addendum)
 Lab called to collect blood due to pt being difficult stick

## 2024-03-08 NOTE — ED Notes (Signed)
 Lab to collect blood due to pt being difficult stick

## 2024-03-08 NOTE — Consult Note (Signed)
 PHARMACY - ANTICOAGULATION CONSULT NOTE  Pharmacy Consult for heparin  infusion Indication: chest pain/ACS  Allergies  Allergen Reactions   Lorazepam      Makes anxiety worse  Other Reaction(s): Makes anxiety worse   Lisinopril  Cough   Amoxicillin      Patient has tolerated cephalosporins in the past    Patient Measurements: Height: 4' 10 (147.3 cm) Weight: 72.6 kg (160 lb) IBW/kg (Calculated) : 40.9 HEPARIN  DW (KG): 57.6  Vital Signs: Temp: 98.7 F (37.1 C) (10/05 0931) Temp Source: Oral (10/05 0931) BP: 124/80 (10/05 1200) Pulse Rate: 73 (10/05 1200)  Labs: Recent Labs    03/08/24 1234  HGB 11.7*  HCT 38.6  PLT 275  CREATININE 0.80  CKTOTAL 180  TROPONINIHS 389*    Estimated Creatinine Clearance: 61.7 mL/min (by C-G formula based on SCr of 0.8 mg/dL).   Medical History: Past Medical History:  Diagnosis Date   Allergy    Anxiety    Asthma    CHF (congestive heart failure) (HCC)    Cocaine abuse (HCC)    COPD (chronic obstructive pulmonary disease) (HCC)    Coronary artery disease    Emphysema/COPD    Hepatitis C    High cholesterol    Hypertension    Pneumothorax    Tobacco abuse     Medications:  No home anticoagulants per pharmacist review  Assessment: 63 yo female presented to ED with complaint of shortness of breath.  PMH includes COPD and CAD s/p stent placement.  Patient found to have elevated troponin.  Pharmacy consulted to initiate heparin  infusion.  Baseline aPTT and PT/INR ordered.   Goal of Therapy:  Heparin  level 0.3-0.7 units/ml Monitor platelets by anticoagulation protocol: Yes   Plan:  Give 3500 units bolus x 1 Start heparin  infusion at 700 units/hr Check anti-Xa level in 6 hours and daily while on heparin  Continue to monitor H&H and platelets  Kayla JULIANNA Blew, PharmD, BCPS 03/08/2024,1:45 PM

## 2024-03-09 ENCOUNTER — Inpatient Hospital Stay (HOSPITAL_COMMUNITY): Admit: 2024-03-09 | Discharge: 2024-03-09 | Disposition: A | Discharge status: Home / Self Care

## 2024-03-09 ENCOUNTER — Inpatient Hospital Stay

## 2024-03-09 DIAGNOSIS — I2489 Other forms of acute ischemic heart disease: Secondary | ICD-10-CM

## 2024-03-09 DIAGNOSIS — I214 Non-ST elevation (NSTEMI) myocardial infarction: Secondary | ICD-10-CM

## 2024-03-09 LAB — CBC
HCT: 33.1 % — ABNORMAL LOW (ref 36.0–46.0)
Hemoglobin: 10.4 g/dL — ABNORMAL LOW (ref 12.0–15.0)
MCH: 26 pg (ref 26.0–34.0)
MCHC: 31.4 g/dL (ref 30.0–36.0)
MCV: 82.8 fL (ref 80.0–100.0)
Platelets: 252 K/uL (ref 150–400)
RBC: 4 MIL/uL (ref 3.87–5.11)
RDW: 14.5 % (ref 11.5–15.5)
WBC: 7.4 K/uL (ref 4.0–10.5)
nRBC: 0 % (ref 0.0–0.2)

## 2024-03-09 LAB — HEPARIN LEVEL (UNFRACTIONATED)
Heparin Unfractionated: <0.1 [IU]/mL — ABNORMAL LOW (ref 0.30–0.70)
Heparin Unfractionated: 0.16 [IU]/mL — ABNORMAL LOW (ref 0.30–0.70)
Heparin Unfractionated: 0.17 [IU]/mL — ABNORMAL LOW (ref 0.30–0.70)

## 2024-03-09 LAB — BASIC METABOLIC PANEL WITH GFR
Anion gap: 11 (ref 5–15)
BUN: 21 mg/dL (ref 8–23)
CO2: 24 mmol/L (ref 22–32)
Calcium: 8.9 mg/dL (ref 8.9–10.3)
Chloride: 103 mmol/L (ref 98–111)
Creatinine, Ser: 0.83 mg/dL (ref 0.44–1.00)
GFR, Estimated: >60 mL/min (ref >60)
Glucose, Bld: 263 mg/dL — ABNORMAL HIGH (ref 70–99)
Potassium: 3.4 mmol/L — ABNORMAL LOW (ref 3.5–5.1)
Sodium: 138 mmol/L (ref 135–145)

## 2024-03-09 LAB — ECHOCARDIOGRAM COMPLETE
AR max vel: 2.78 cm2
AV Area VTI: 3.29 cm2
AV Area mean vel: 2.66 cm2
AV Mean grad: 2 mmHg
AV Peak grad: 3.9 mmHg
Ao pk vel: 0.98 m/s
Area-P 1/2: 2.53 cm2
Height: 58 in
MV VTI: 1.92 cm2
S' Lateral: 2.7 cm
Weight: 2645.52 [oz_av]

## 2024-03-09 LAB — MAGNESIUM: Magnesium: 1.7 mg/dL (ref 1.7–2.4)

## 2024-03-09 LAB — PHOSPHORUS: Phosphorus: 2.5 mg/dL (ref 2.5–4.6)

## 2024-03-09 LAB — URINE CULTURE: Culture: <10000 — AB

## 2024-03-09 LAB — GLUCOSE, CAPILLARY
Glucose-Capillary: 255 mg/dL — ABNORMAL HIGH (ref 70–99)
Glucose-Capillary: 207 mg/dL — ABNORMAL HIGH (ref 70–99)

## 2024-03-09 MED ORDER — INSULIN ASPART 100 UNIT/ML IJ SOLN
0.0000 [IU] | Freq: Every day | INTRAMUSCULAR | Status: DC
  Administered 2024-03-09: 2 [IU] via SUBCUTANEOUS
  Filled 2024-03-09: qty 1

## 2024-03-09 MED ORDER — HEPARIN BOLUS VIA INFUSION
1750.0000 [IU] | Freq: Once | INTRAVENOUS | Status: AC
  Administered 2024-03-09: 1750 [IU] via INTRAVENOUS
  Filled 2024-03-09: qty 1750

## 2024-03-09 MED ORDER — PAROXETINE HCL 30 MG PO TABS
30.0000 mg | ORAL_TABLET | Freq: Every day | ORAL | Status: DC
Start: 2024-03-09 — End: 2024-03-13
  Administered 2024-03-09 – 2024-03-12 (×4): 30 mg via ORAL
  Filled 2024-03-09 (×4): qty 1

## 2024-03-09 MED ORDER — INSULIN GLARGINE 100 UNIT/ML ~~LOC~~ SOLN
20.0000 [IU] | Freq: Every day | SUBCUTANEOUS | Status: DC
  Administered 2024-03-09 – 2024-03-10 (×2): 20 [IU] via SUBCUTANEOUS
  Filled 2024-03-09 (×2): qty 0.2

## 2024-03-09 MED ORDER — INSULIN ASPART 100 UNIT/ML IJ SOLN
0.0000 [IU] | Freq: Three times a day (TID) | INTRAMUSCULAR | Status: DC
  Administered 2024-03-09: 8 [IU] via SUBCUTANEOUS
  Administered 2024-03-10 (×2): 5 [IU] via SUBCUTANEOUS
  Filled 2024-03-09 (×3): qty 1

## 2024-03-09 MED ORDER — MAGNESIUM SULFATE 2 GM/50ML IV SOLN
2.0000 g | Freq: Once | INTRAVENOUS | Status: AC
  Administered 2024-03-09: 2 g via INTRAVENOUS
  Filled 2024-03-09: qty 50

## 2024-03-09 MED ORDER — POTASSIUM CHLORIDE CRYS ER 20 MEQ PO TBCR
40.0000 meq | EXTENDED_RELEASE_TABLET | Freq: Two times a day (BID) | ORAL | Status: AC
  Administered 2024-03-09 (×2): 40 meq via ORAL
  Filled 2024-03-09 (×2): qty 2

## 2024-03-09 MED ORDER — HEPARIN BOLUS VIA INFUSION
1700.0000 [IU] | Freq: Once | INTRAVENOUS | Status: AC
  Administered 2024-03-09: 1700 [IU] via INTRAVENOUS
  Filled 2024-03-09: qty 1700

## 2024-03-09 NOTE — Progress Notes (Signed)
 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  She appears to have developled interval pleural effusion. She is on home ventilator.  She is having her meds refined adding paroxetine  and her home insulin .    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, HTN, HLD, CHF, CAD s/p STEMI s/ stent 2009, DM type 2 w/ insulin  dependence, Hepatitis C, Former tobacco and cocaine use, Anxiety/Depression, and Obesity    Significant Hospital Events   RECURRENT ADMISSIONS in the PAST   Antimicrobials:  Zosyn       Significant Hospital Events: Including procedures, antibiotic start and stop dates in addition to other pertinent events   10/5 Admit to ICU on home ventilator with acute on chronic respiratory failure iso suspected Pneumonia, AECOPD and UTI  Trache site clean/ #6 Shiley tracheostomy, on home VENT   Objective   Blood pressure 124/77, pulse (!) 55, temperature 97.6 F (36.4 C), temperature source Oral, resp. rate 16, height 4' 10 (1.473 m), weight 75 kg, last menstrual period 03/06/2005, SpO2 100%.    Vent Mode: SIMV;PSV FiO2 (%):  [28 %-40 %] 28 % Set Rate:  [16 bmp] 16 bmp Vt Set:  [400 mL] 400 mL PEEP:  [5 cmH20] 5 cmH20 Pressure Support:  [10 cmH20] 10 cmH20   Intake/Output Summary (Last 24 hours) at 03/09/2024 0802 Last data filed at  03/09/2024 9375 Gross per 24 hour  Intake 260.87 ml  Output 350 ml  Net -89.13 ml   Filed Weights   03/08/24 0927 03/09/24 0500  Weight: 72.6 kg 75 kg       Review of Systems: Gen:  Denies  fever, sweats, chills weight loss  HEENT: Denies blurred vision, double vision, ear pain, eye pain, hearing loss, nose bleeds, sore throat Cardiac:  No dizziness, chest pain or heaviness, chest tightness,edema, No JVD Resp:   No cough, -sputum production, +shortness of breath,-wheezing, -hemoptysis,  Other:  All other systems negative   Physical Examination:   General Appearance: No distress  EYES PERRLA, EOM intact.   NECK Supple, No JVD Pulmonary: normal breath sounds, No wheezing.  CardiovascularNormal S1,S2.  No m/r/g.   Abdomen: Benign, Soft, non-tender. Neurology UE/LE 5/5 strength, no focal deficits Ext pulses intact, cap refill intact ALL OTHER ROS ARE NEGATIVE    Labs/imaging that I havepersonally reviewed  (right click and Reselect all SmartList Selections daily)     ASSESSMENT AND PLAN SYNOPSIS  Acute on Chronic Hypoxic & Hypercapnic Respiratory failure in the setting of ... Acute COPD Exacerbation  Suspected Pneumonia PMHx: COPD/Emphysema/Asthma with chronic tracheostomy and home vent.  -Vent support per home setting -titrate FiO2, PEEP to maintain O2 sat >90%  -Lung protective ventilation  -PRN Chest X-ray & ABG -PRN and scheduled bronchodilators, hypertonic saline -Start systemic steroid IV ABX   NSTEMI Heparin  infusion Cardiology consulted -Prn Valium  -Resume outpatient Klonopin     ENDO - ICU  hypoglycemic\Hyperglycemia protocol -check FSBS per protocol   GI GI PROPHYLAXIS as indicated NUTRITIONAL STATUS DIET-->as tolerated Constipation protocol as indicated   ELECTROLYTES -follow labs as needed -replace as needed -pharmacy consultation and following  RESTRICTIVE TRANSFUSION PROTOCOL TRANSFUSION  IF HGB<7  or ACTIVE BLEEDING OR DX of  ACUTE CORONARY SYNDROMES     Best practice (right click and Reselect all SmartList Selections daily)  VAP protocol (if indicated): Yes DVT prophylaxis: Systemic AC GI prophylaxis: H2B Mobility:  bed rest  Code Status:  FULL Disposition:ICU  Labs   CBC: Recent Labs  Lab 03/08/24 1234 03/09/24 0620  WBC 10.0 7.4  NEUTROABS 7.8*  --   HGB 11.7* 10.4*  HCT 38.6 33.1*  MCV 83.7 82.8  PLT 275 252    Basic Metabolic Panel: Recent Labs  Lab 03/08/24 1234 03/09/24 0620  NA 140 138  K 4.1 3.4*  CL 103 103  CO2 25 24  GLUCOSE 230* 263*  BUN 18 21  CREATININE 0.80 0.83  CALCIUM  9.4 8.9  MG  --  1.7  PHOS  --  2.5   GFR: Estimated Creatinine Clearance: 60.5 mL/min (by C-G formula based on SCr of 0.83 mg/dL). Recent Labs  Lab 03/08/24 1234 03/08/24 1445 03/09/24 0620  WBC 10.0  --  7.4  LATICACIDVEN 1.3 1.2  --     Liver Function Tests: Recent Labs  Lab 03/08/24 1234  AST 22  ALT 12  ALKPHOS 86  BILITOT 0.6  PROT 8.2*  ALBUMIN  3.5   Recent Labs  Lab 03/08/24 1234  LIPASE 41   No results for input(s): AMMONIA in the last 168 hours.  ABG    Component Value Date/Time   PHART 7.42 03/08/2024 1433   PCO2ART 53 (H) 03/08/2024 1433   PO2ART 64 (L) 03/08/2024 1433   HCO3 34.4 (H) 03/08/2024 1433   ACIDBASEDEF 1.5 08/29/2023 0307   O2SAT 94.6 03/08/2024 1433     Coagulation Profile: Recent Labs  Lab 03/08/24 1433  INR 1.0    Cardiac Enzymes: Recent Labs  Lab 03/08/24 1234  CKTOTAL 180    HbA1C: Hemoglobin A1C  Date/Time Value Ref Range Status  06/29/2013 04:02 AM 6.0 4.2 - 6.3 % Final    Comment:    The American Diabetes Association recommends that a primary goal of therapy should be <7% and that physicians should reevaluate the treatment regimen in patients with HbA1c values consistently >8%.    Hgb A1c MFr Bld  Date/Time Value Ref Range Status  08/29/2023 01:01 AM 9.4 (H) 4.8 - 5.6 % Final    Comment:    (NOTE) Pre  diabetes:          5.7%-6.4%  Diabetes:              >6.4%  Glycemic control for   <7.0% adults with diabetes   02/19/2023 04:33 AM 10.3 (H) 4.8 - 5.6 % Final    Comment:    (NOTE)         Prediabetes: 5.7 - 6.4         Diabetes: >6.4         Glycemic control for adults with diabetes: <7.0     CBG: Recent Labs  Lab 03/08/24 1603  GLUCAP 173*     Past Medical History:  She,  has a past medical history of Allergy, Anxiety, Asthma, CHF (congestive heart failure) (HCC), Cocaine abuse (HCC), COPD (chronic obstructive pulmonary disease) (HCC), Coronary artery disease, Emphysema/COPD, Hepatitis C, High cholesterol, Hypertension,  Pneumothorax, and Tobacco abuse.   Surgical History:   Past Surgical History:  Procedure Laterality Date   CARDIAC CATHETERIZATION     CHEST TUBE INSERTION     IR GASTROSTOMY TUBE MOD SED  10/31/2017   TRACHEOSTOMY TUBE PLACEMENT N/A 10/17/2017   Procedure: TRACHEOSTOMY;  Surgeon: Milissa Hamming, MD;  Location: [facility] ORS;  Service: ENT;  Laterality: N/A;     Social History:   reports that she quit smoking about 6 years ago. Her smoking use included cigarettes. She started smoking about 47 years ago. She has a 4.1 pack-year smoking history. She has never used smokeless tobacco. She reports that she does not currently use drugs after having used the following drugs: Crack cocaine. She reports that she does not drink alcohol.   Family History:  Her family history includes Asthma in her mother; Breast cancer (age of onset: 25) in her maternal aunt; CAD in her father; COPD in her sister; CVA in her father; Lung cancer in her mother; Stroke in her father.   Allergies Allergies  Allergen Reactions   Lorazepam      Makes anxiety worse  Other Reaction(s): Makes anxiety worse   Lisinopril  Cough   Amoxicillin      Patient has tolerated cephalosporins in the past    IMAGING     Home Medications  Prior to Admission medications   Medication Sig Start  Date End Date Taking? Authorizing Provider  acetaminophen  (TYLENOL ) 325 MG tablet Take 2 tablets (650 mg total) by mouth every 6 (six) hours as needed for mild pain (pain score 1-3) (or Fever >/= 101). 04/24/23   Dorinda Drue DASEN, MD  albuterol  (PROVENTIL ) (2.5 MG/3ML) 0.083% nebulizer solution Take 3 mLs (2.5 mg total) by nebulization every 6 (six) hours as needed for wheezing or shortness of breath. 03/27/23   Tamea Dedra CROME, MD  albuterol  (VENTOLIN  HFA) 108 (90 Base) MCG/ACT inhaler Inhale 2 puffs into the lungs every 6 (six) hours as needed for wheezing or shortness of breath. 04/02/22   Parrett, Madelin RAMAN, NP  amLODipine  (NORVASC ) 5 MG tablet Take 5 mg by mouth daily. 08/27/21   [provider]  aspirin  81 MG chewable tablet Chew 81 mg by mouth daily.     [provider]  azelastine  (ASTELIN ) 0.1 % nasal spray SMARTSIG:1-2 Spray(s) Both Nares Every 12 Hours PRN 09/22/20   [provider]  Blood Glucose Monitoring Suppl ([employer] BLOOD GLUCOSE METER) w/Device KIT Use as directed to monitor blood glucose up to qid 02/22/23   Alexander, Natalie, DO  cefdinir (OMNICEF) 300 MG capsule Take 1 capsule (300 mg total) by mouth 2 (two) times daily. 03/02/24   Brimage, Vondra, DO  cetirizine  (ZYRTEC ) 10 MG tablet Take 1 tablet (10 mg total) by mouth daily as needed for allergies. Home med. 12/20/23   Awanda City, MD  clonazePAM  (KLONOPIN ) 0.5 MG tablet Take 0.5 mg by mouth in the morning, at noon, and at bedtime.    [provider]  cloNIDine  (CATAPRES  - DOSED IN MG/24 HR) 0.1 mg/24hr patch Place 0.1 mg onto the skin once a week.    [provider]  docusate sodium  (COLACE) 100 MG capsule Take 2 capsules (200 mg total) by mouth daily as needed for mild constipation. 04/24/23   Dorinda Drue DASEN, MD  famotidine  (PEPCID ) 20 MG tablet Take 1 tablet by mouth daily as needed for heartburn 09/24/23   [provider]  fluconazole  (DIFLUCAN ) 150 MG tablet Take 1 tablet (150 mg  total)  by mouth once a week. 03/02/24   Brimage, Vondra, DO  fluticasone  (FLONASE ) 50 MCG/ACT nasal spray Place 1 spray into both nostrils daily as needed for allergies or rhinitis. Home med. 12/20/23   Awanda City, MD  guaiFENesin  (MUCINEX ) 600 MG 12 hr tablet Take 600 mg by mouth 2 (two) times daily.    [provider]  hydrOXYzine  (VISTARIL ) 25 MG capsule Take 25 mg by mouth every 6 (six) hours as needed. 10/01/22   [provider]  ipratropium-albuterol  (DUONEB) 0.5-2.5 (3) MG/3ML SOLN Take 3 mLs by nebulization every 6 (six) hours as needed (for shortness of breath). 06/17/23   Kasa, Kurian, MD  LANTUS  SOLOSTAR 100 UNIT/ML Solostar Pen Inject 40 Units into the skin at bedtime. 01/03/23   [provider]  mirtazapine  (REMERON ) 30 MG tablet Take 1 tablet by mouth every night at bedtime 10/17/23   [provider]  Multiple Vitamin (MULTIVITAMIN WITH MINERALS) TABS tablet Take 1 tablet by mouth daily. 12/20/23   Awanda City, MD  naloxone  (NARCAN ) nasal spray 4 mg/0.1 mL Place 1 spray into the nose once as needed for up to 1 dose. Home med. 12/20/23   Awanda City, MD  NOVOLOG  FLEXPEN 100 UNIT/ML FlexPen Inject 5-10 Units into the skin 3 (three) times daily with meals. Sliding scale mealtime insulin . 03/01/21   [provider]  nystatin  ointment (MYCOSTATIN ) Apply 1 Application topically 3 (three) times daily as needed. Home med. 12/20/23   Awanda City, MD  oxyCODONE -acetaminophen  (PERCOCET/ROXICET) 5-325 MG tablet Take 1 tablet by mouth 3 (three) times daily as needed for pain. 09/07/21   [provider]  PARoxetine  (PAXIL ) 30 MG tablet Take 30 mg by mouth daily. 09/19/23   [provider]  polyethylene glycol powder (GLYCOLAX /MIRALAX ) 17 GM/SCOOP powder Take 17 g by mouth daily as needed. Home med. 12/20/23   Awanda City, MD  PULMICORT  0.5 MG/2ML nebulizer solution Take 0.5 mg by nebulization every 12 (twelve) hours. 02/13/21   [provider]   terbinafine (LAMISIL) 1 % cream Apply 1 application  topically 2 (two) times daily. To affected skin, for 1 week after resolution of rash. # 30 gm, 1 refill.    [provider]  TRULICITY 4.5 MG/0.5ML SOPN Inject 4.5 mg into the skin once a week. 09/28/21   [provider]  Vitamin D , Ergocalciferol , (DRISDOL ) 1.25 MG (50000 UNIT) CAPS capsule Take 50,000 Units by mouth every 7 (seven) days.    [provider]  fluconazole  (DIFLUCAN ) 150 MG tablet Take 1 tablet day 1 repeat in 1 week 11/15/20   Moye, Virginia , MD      Critical care provider statement:   Total critical care time: 33 minutes   Performed by: Parris MD   Critical care time was exclusive of separately billable procedures and treating other patients.   Critical care was necessary to treat or prevent imminent or life-threatening deterioration.   Critical care was time spent personally by me on the following activities: development of treatment plan with patient and/or surrogate as well as nursing, discussions with consultants, evaluation of patient's response to treatment, examination of patient, obtaining history from patient or surrogate, ordering and performing treatments and interventions, ordering and review of laboratory studies, ordering and review of radiographic studies, pulse oximetry and re-evaluation of patient's condition.    Tyra Michelle, M.D.  Pulmonary & Critical Care Medicine

## 2024-03-09 NOTE — Plan of Care (Signed)
  Problem: Education: Goal: Knowledge of General Education information will improve Description: Including pain rating scale, medication(s)/side effects and non-pharmacologic comfort measures Outcome: Progressing   Problem: Clinical Measurements: Goal: Ability to maintain clinical measurements within normal limits will improve Outcome: Progressing Goal: Will remain free from infection Outcome: Progressing Goal: Respiratory complications will improve Outcome: Progressing   Problem: Coping: Goal: Level of anxiety will decrease Outcome: Progressing   Problem: Skin Integrity: Goal: Risk for impaired skin integrity will decrease Outcome: Progressing

## 2024-03-09 NOTE — Progress Notes (Signed)
*  PRELIMINARY RESULTS* Echocardiogram 2D Echocardiogram has been performed.  Floydene Harder 03/09/2024, 8:43 AM

## 2024-03-09 NOTE — Progress Notes (Signed)
 PHARMACY CONSULT NOTE - ELECTROLYTES  Pharmacy Consult for Electrolyte Monitoring and Replacement   Recent Labs: Height: 4' 10 (147.3 cm) Weight: 75 kg (165 lb 5.5 oz) IBW/kg (Calculated) : 40.9 Estimated Creatinine Clearance: 60.5 mL/min (by C-G formula based on SCr of 0.83 mg/dL). Potassium (mmol/L)  Date Value  03/09/2024 3.4 (L)  07/11/2014 4.6   Magnesium  (mg/dL)  Date Value  89/93/7974 1.7  10/31/2013 2.6 (H)   Calcium  (mg/dL)  Date Value  89/93/7974 8.9   Calcium , Total (mg/dL)  Date Value  97/92/7983 8.9   Albumin  (g/dL)  Date Value  89/94/7974 3.5  07/09/2014 3.7   Phosphorus (mg/dL)  Date Value  89/93/7974 2.5  10/31/2013 4.6   Sodium (mmol/L)  Date Value  03/09/2024 138  07/11/2014 139   Corrected Ca: 9.3 mg/dL  Assessment  Michelle [name] is a 63 y.o. female presenting with acute on chronic respiratory failure. PMH significant for COPD, chronic trach, HTN, HLD, CHF, CAD s/p STEMI s/stent 2009, T2DM, Hepatitis C, anxiety/depression, obesity. Pharmacy has been consulted to monitor and replace electrolytes.  Diet: Regular  Goal of Therapy: Electrolytes WNL  Plan:  KCl 40 mEq PO x 2  Mag sulfate 2 g IV x 1  Check renal function panel, and Mg, with AM labs  Thank you for allowing pharmacy to be a part of this patient's care.  Bernardino George, PharmD Candidate [phone] [facility] [employer] 03/09/2024 7:31 AM

## 2024-03-09 NOTE — Consult Note (Signed)
 PHARMACY - ANTICOAGULATION CONSULT NOTE  Pharmacy Consult for heparin  infusion Indication: chest pain/ACS  Allergies  Allergen Reactions   Lorazepam      Makes anxiety worse  Other Reaction(s): Makes anxiety worse   Lisinopril  Cough   Amoxicillin      Patient has tolerated cephalosporins in the past    Patient Measurements: Height: 4' 10 (147.3 cm) Weight: 75 kg (165 lb 5.5 oz) IBW/kg (Calculated) : 40.9 HEPARIN  DW (KG): 57.6  Vital Signs: Temp: 98.3 F (36.8 C) (10/06 2000) Temp Source: Oral (10/06 2000) BP: 120/70 (10/06 2100) Pulse Rate: 79 (10/06 2100)  Labs: Recent Labs    03/08/24 1234 03/08/24 1433 03/08/24 1746 03/08/24 1746 03/08/24 2210 03/09/24 0620 03/09/24 0628 03/09/24 2130  HGB 11.7*  --   --   --   --  10.4*  --   --   HCT 38.6  --   --   --   --  33.1*  --   --   PLT 275  --   --   --   --  252  --   --   APTT  --  32  --   --   --   --   --   --   LABPROT  --  13.5  --   --   --   --   --   --   INR  --  1.0  --   --   --   --   --   --   HEPARINUNFRC  --   --  0.14*   < > <0.10*  --  0.16* 0.17*  CREATININE 0.80  --   --   --   --  0.83  --   --   CKTOTAL 180  --   --   --   --   --   --   --   TROPONINIHS 389* 407* 350*  --   --   --   --   --    < > = values in this interval not displayed.    Estimated Creatinine Clearance: 60.5 mL/min (by C-G formula based on SCr of 0.83 mg/dL).   Medical History: Past Medical History:  Diagnosis Date   Allergy    Anxiety    Asthma    CHF (congestive heart failure) (HCC)    Cocaine abuse (HCC)    COPD (chronic obstructive pulmonary disease) (HCC)    Coronary artery disease    Emphysema/COPD    Hepatitis C    High cholesterol    Hypertension    Pneumothorax    Tobacco abuse     Medications:  No home anticoagulants per pharmacist review  Assessment: 63 yo female presented to ED with complaint of shortness of breath.  PMH includes COPD and CAD s/p stent placement.  Patient found to have  elevated troponin.  Pharmacy consulted to initiate heparin  infusion.  Baseline aPTT and PT/INR ordered.   Goal of Therapy:  Heparin  level 0.3-0.7 units/ml Monitor platelets by anticoagulation protocol: Yes  Date Time HL Rate/Comment  10/6  0628    0.16   Subtherapeutic  10/6 2130 0.17 Subtherapeutic  Plan:  Bolus 1750 units x 1  Increase heparin  infusion to 1250 units/hr Recheck HL in 6 hrs after rate change CBC daily while on heparin   Michelle [name], PharmD Clinical Pharmacist 03/09/2024 10:11 PM

## 2024-03-09 NOTE — Consult Note (Signed)
 PHARMACY - ANTICOAGULATION CONSULT NOTE  Pharmacy Consult for heparin  infusion Indication: chest pain/ACS  Allergies  Allergen Reactions   Lorazepam      Makes anxiety worse  Other Reaction(s): Makes anxiety worse   Lisinopril  Cough   Amoxicillin      Patient has tolerated cephalosporins in the past    Patient Measurements: Height: 4' 10 (147.3 cm) Weight: 72.6 kg (160 lb) IBW/kg (Calculated) : 40.9 HEPARIN  DW (KG): 57.6  Vital Signs: Temp: 98 F (36.7 C) (10/06 0000) Temp Source: Oral (10/06 0000) BP: 112/73 (10/06 0000) Pulse Rate: 69 (10/06 0000)  Labs: Recent Labs    03/08/24 1234 03/08/24 1433 03/08/24 1746  HGB 11.7*  --   --   HCT 38.6  --   --   PLT 275  --   --   APTT  --  32  --   LABPROT  --  13.5  --   INR  --  1.0  --   HEPARINUNFRC  --   --  0.14*  CREATININE 0.80  --   --   CKTOTAL 180  --   --   TROPONINIHS 389* 407* 350*    Estimated Creatinine Clearance: 61.7 mL/min (by C-G formula based on SCr of 0.8 mg/dL).   Medical History: Past Medical History:  Diagnosis Date   Allergy    Anxiety    Asthma    CHF (congestive heart failure) (HCC)    Cocaine abuse (HCC)    COPD (chronic obstructive pulmonary disease) (HCC)    Coronary artery disease    Emphysema/COPD    Hepatitis C    High cholesterol    Hypertension    Pneumothorax    Tobacco abuse     Medications:  No home anticoagulants per pharmacist review  Assessment: 63 yo female presented to ED with complaint of shortness of breath.  PMH includes COPD and CAD s/p stent placement.  Patient found to have elevated troponin.  Pharmacy consulted to initiate heparin  infusion.  Baseline aPTT and PT/INR ordered.   Goal of Therapy:  Heparin  level 0.3-0.7 units/ml Monitor platelets by anticoagulation protocol: Yes  10/05 2210 HL <0.1 (Verbal from lab)   Plan:  Bolus 1700 units x 1 Increase heparin  infusion to 900 units/hr Recheck HL in 6 hrs after rate change CBC daily while on  heparin   Rankin CANDIE Dills, PharmD, [facility] 03/09/2024 12:20 AM

## 2024-03-09 NOTE — Progress Notes (Signed)
 Rounding Note   Patient Name: Michelle [name] Date of Encounter: 03/09/2024  [facility] Cardiologist: None   Subjective The patient was seen in the morning.  She reports improvement in shortness of breath and no chest pain.  Scheduled Meds:  amLODipine   5 mg Oral Daily   aspirin  EC  81 mg Oral Daily   budesonide   0.5 mg Nebulization Q12H   Chlorhexidine  Gluconate Cloth  6 each Topical Daily   cloNIDine   0.1 mg Transdermal Weekly   famotidine   40 mg Oral Daily   insulin  aspart  0-15 Units Subcutaneous TID WC   insulin  aspart  0-5 Units Subcutaneous QHS   insulin  glargine  20 Units Subcutaneous Daily   ipratropium-albuterol   3 mL Nebulization Q6H   mirtazapine   45 mg Oral QHS   mouth rinse  15 mL Mouth Rinse Q2H   PARoxetine   30 mg Oral Daily   potassium chloride   40 mEq Oral BID   rosuvastatin  40 mg Oral Daily   Continuous Infusions:  heparin  1,100 Units/hr (03/09/24 1243)   piperacillin -tazobactam (ZOSYN )  IV 3.375 g (03/09/24 1031)   PRN Meds: clonazePAM , diazepam , docusate sodium , hydrALAZINE , ondansetron  (ZOFRAN ) IV, mouth rinse, oxyCODONE -acetaminophen , polyethylene glycol   Vital Signs  Vitals:   03/09/24 1400 03/09/24 1500 03/09/24 1610 03/09/24 1640  BP: (!) 122/46 (!) 106/52 (!) 244/96 (!) 149/88  Pulse: 61 71 (!) 128 (!) 115  Resp: 16 16 (!) 22 18  Temp:      TempSrc:      SpO2: 100% 97% 94% 100%  Weight:      Height:        Intake/Output Summary (Last 24 hours) at 03/09/2024 1704 Last data filed at 03/09/2024 0900 Gross per 24 hour  Intake 740.87 ml  Output 350 ml  Net 390.87 ml      03/09/2024    5:00 AM 03/08/2024    9:27 AM 12/20/2023    5:00 AM  Last 3 Weights  Weight (lbs) 165 lb 5.5 oz 160 lb 164 lb 7.4 oz  Weight (kg) 75 kg 72.576 kg 74.6 kg      Telemetry Sinus rhythm with occasional PVCs - Personally Reviewed  ECG   - Personally Reviewed  Physical Exam  GEN: No acute distress.   Neck: Unable to visualize jugular  venous pressure. Cardiac: RRR, no murmurs, rubs, or gallops.  Respiratory: Clear to auscultation bilaterally.  She has a trach in place. GI: Soft, nontender, non-distended  MS: No edema; No deformity. Neuro:  Nonfocal  Psych: Normal affect   Labs High Sensitivity Troponin:   Recent Labs  Lab 03/08/24 1234 03/08/24 1433 03/08/24 1746  TROPONINIHS 389* 407* 350*     Chemistry Recent Labs  Lab 03/08/24 1234 03/09/24 0620  NA 140 138  K 4.1 3.4*  CL 103 103  CO2 25 24  GLUCOSE 230* 263*  BUN 18 21  CREATININE 0.80 0.83  CALCIUM  9.4 8.9  MG  --  1.7  PROT 8.2*  --   ALBUMIN  3.5  --   AST 22  --   ALT 12  --   ALKPHOS 86  --   BILITOT 0.6  --   GFRNONAA >60 >60  ANIONGAP 12 11    Lipids  Recent Labs  Lab 03/08/24 1433  CHOL 191  TRIG 149  HDL 45  LDLCALC 116*  CHOLHDL 4.2    Hematology Recent Labs  Lab 03/08/24 1234 03/09/24 0620  WBC 10.0 7.4  RBC 4.61 4.00  HGB 11.7* 10.4*  HCT 38.6 33.1*  MCV 83.7 82.8  MCH 25.4* 26.0  MCHC 30.3 31.4  RDW 14.5 14.5  PLT 275 252   Thyroid   Recent Labs  Lab 03/08/24 1433  TSH 2.297    BNP Recent Labs  Lab 03/08/24 1234  BNP 162.6*    DDimer  Recent Labs  Lab 03/08/24 1433  DDIMER 0.38     Radiology  ECHOCARDIOGRAM COMPLETE Result Date: 03/09/2024    ECHOCARDIOGRAM REPORT   Patient Name:   Michelle [name] Date of Exam: 03/09/2024 Medical Rec #:  [id number]     Height:       58.0 in Accession #:    [id number]    Weight:       165.3 lb Date of Birth:  Aug 02, 1960     BSA:          1.680 m Patient Age:    63 years      BP:           124/77 mmHg Patient Gender: F             HR:           55 bpm. Exam Location:  [facility] Procedure: 2D Echo, Cardiac Doppler and Color Doppler (Both Spectral and Color            Flow Doppler were utilized during procedure). Indications:     NSTEMI I21.4  History:         Patient has prior history of Echocardiogram examinations, most                  recent 09/03/2023. CHF, COPD; Risk  Factors:Hypertension.  Sonographer:     Christopher Furnace Referring Phys:  [id number] TRAVIS SKIPINA Diagnosing Phys: Deatrice Cage MD  Sonographer Comments: Technically challenging study due to limited acoustic windows and suboptimal apical window. Pt on trach tube--kept supine. IMPRESSIONS  1. Left ventricular ejection fraction, by estimation, is 60 to 65%. The left ventricle has normal function. The left ventricle has no regional wall motion abnormalities. Left ventricular diastolic parameters are consistent with Grade I diastolic dysfunction (impaired relaxation).  2. Right ventricular systolic function is normal. The right ventricular size is normal. Tricuspid regurgitation signal is inadequate for assessing PA pressure.  3. The mitral valve is normal in structure. No evidence of mitral valve regurgitation. No evidence of mitral stenosis.  4. The aortic valve is normal in structure. Aortic valve regurgitation is not visualized. No aortic stenosis is present.  5. The inferior vena cava is normal in size with greater than 50% respiratory variability, suggesting right atrial pressure of 3 mmHg.  6. Technically challenging study FINDINGS  Left Ventricle: Left ventricular ejection fraction, by estimation, is 60 to 65%. The left ventricle has normal function. The left ventricle has no regional wall motion abnormalities. The left ventricular internal cavity size was normal in size. There is  no left ventricular hypertrophy. Left ventricular diastolic parameters are consistent with Grade I diastolic dysfunction (impaired relaxation). Right Ventricle: The right ventricular size is normal. No increase in right ventricular wall thickness. Right ventricular systolic function is normal. Tricuspid regurgitation signal is inadequate for assessing PA pressure. The tricuspid regurgitant velocity is 1.49 m/s, and with an assumed right atrial pressure of 3 mmHg, the estimated right ventricular systolic pressure is 11.9 mmHg. Left Atrium:  Left atrial size was normal in size. Right Atrium: Right atrial size was normal in size. Pericardium: There is  no evidence of pericardial effusion. Mitral Valve: The mitral valve is normal in structure. No evidence of mitral valve regurgitation. No evidence of mitral valve stenosis. MV peak gradient, 5.0 mmHg. The mean mitral valve gradient is 2.0 mmHg. Tricuspid Valve: The tricuspid valve is normal in structure. Tricuspid valve regurgitation is not demonstrated. No evidence of tricuspid stenosis. Aortic Valve: The aortic valve is normal in structure. Aortic valve regurgitation is not visualized. No aortic stenosis is present. Aortic valve mean gradient measures 2.0 mmHg. Aortic valve peak gradient measures 3.9 mmHg. Aortic valve area, by VTI measures 3.29 cm. Pulmonic Valve: The pulmonic valve was normal in structure. Pulmonic valve regurgitation is not visualized. No evidence of pulmonic stenosis. Aorta: The aortic root is normal in size and structure. Venous: The inferior vena cava is normal in size with greater than 50% respiratory variability, suggesting right atrial pressure of 3 mmHg. IAS/Shunts: No atrial level shunt detected by color flow Doppler.  LEFT VENTRICLE PLAX 2D LVIDd:         3.80 cm   Diastology LVIDs:         2.70 cm   LV e' medial:    7.72 cm/s LV PW:         0.90 cm   LV E/e' medial:  9.9 LV IVS:        1.10 cm   LV e' lateral:   4.68 cm/s LVOT diam:     2.00 cm   LV E/e' lateral: 16.3 LV SV:         63 LV SV Index:   38 LVOT Area:     3.14 cm  RIGHT VENTRICLE RV Basal diam:  2.70 cm RV Mid diam:    3.00 cm RV S prime:     10.60 cm/s TAPSE (M-mode): 1.8 cm LEFT ATRIUM           Index        RIGHT ATRIUM          Index LA diam:      3.20 cm 1.91 cm/m   RA Area:     9.87 cm LA Vol (A4C): 28.4 ml 16.91 ml/m  RA Volume:   20.60 ml 12.26 ml/m  AORTIC VALVE AV Area (Vmax):    2.78 cm AV Area (Vmean):   2.66 cm AV Area (VTI):     3.29 cm AV Vmax:           98.30 cm/s AV Vmean:           66.500 cm/s AV VTI:            0.193 m AV Peak Grad:      3.9 mmHg AV Mean Grad:      2.0 mmHg LVOT Vmax:         87.00 cm/s LVOT Vmean:        56.400 cm/s LVOT VTI:          0.202 m LVOT/AV VTI ratio: 1.05  AORTA Ao Root diam: 2.30 cm MITRAL VALVE                TRICUSPID VALVE MV Area (PHT): 2.53 cm     TR Peak grad:   8.9 mmHg MV Area VTI:   1.92 cm     TR Vmax:        149.00 cm/s MV Peak grad:  5.0 mmHg MV Mean grad:  2.0 mmHg     SHUNTS MV Vmax:       1.12  m/s     Systemic VTI:  0.20 m MV Vmean:      61.8 cm/s    Systemic Diam: 2.00 cm MV Decel Time: 300 msec MV E velocity: 76.50 cm/s MV A velocity: 101.00 cm/s MV E/A ratio:  0.76 Deatrice Cage MD Electronically signed by Deatrice Cage MD Signature Date/Time: 03/09/2024/10:40:41 AM    Final    DG Chest Port 1 View Result Date: 03/09/2024 EXAM: 1 VIEW(S) XRAY OF THE CHEST 03/09/2024 05:05:50 AM COMPARISON: 03/08/2024 CLINICAL HISTORY: respiratory failure. Shortness of breath, respiratory failure FINDINGS: LINES, TUBES AND DEVICES: Tracheostomy tube in place with tip projecting over mid intrathoracic trachea. LUNGS AND PLEURA: Persistent right basilar patchy opacities. No pulmonary edema. Small left pleural effusion. Blunting of left costophrenic angle. No pneumothorax. HEART AND MEDIASTINUM: No acute abnormality of the cardiac and mediastinal silhouettes. BONES AND SOFT TISSUES: No acute osseous abnormality. IMPRESSION: 1. Persistent right basilar patchy opacities. 2. Small left pleural effusion. Electronically signed by: Evalene Coho MD 03/09/2024 05:17 AM EDT RP Workstation: [id number]   US  Venous Img Lower Bilateral Result Date: 03/08/2024 CLINICAL DATA:  Lower extremity pain and shortness of breath. EXAM: BILATERAL LOWER EXTREMITY VENOUS DOPPLER ULTRASOUND TECHNIQUE: Gray-scale sonography with graded compression, as well as color Doppler and duplex ultrasound were performed to evaluate the lower extremity deep venous systems from the level of  the common femoral vein and including the common femoral, femoral, profunda femoral, popliteal and calf veins including the posterior tibial, peroneal and gastrocnemius veins when visible. The superficial great saphenous vein was also interrogated. Spectral Doppler was utilized to evaluate flow at rest and with distal augmentation maneuvers in the common femoral, femoral and popliteal veins. COMPARISON:  11/05/2013 FINDINGS: RIGHT LOWER EXTREMITY Common Femoral Vein: No evidence of thrombus. Normal compressibility, respiratory phasicity and response to augmentation. Saphenofemoral Junction: No evidence of thrombus. Normal compressibility and flow on color Doppler imaging. Profunda Femoral Vein: No evidence of thrombus. Normal compressibility and flow on color Doppler imaging. Femoral Vein: No evidence of thrombus. Normal compressibility, respiratory phasicity and response to augmentation. Popliteal Vein: No evidence of thrombus. Normal compressibility, respiratory phasicity and response to augmentation. Calf Veins: No evidence of thrombus. Normal compressibility and flow on color Doppler imaging. Superficial Great Saphenous Vein: No evidence of thrombus. Normal compressibility. Venous Reflux:  None. Other Findings: No evidence of superficial thrombophlebitis or abnormal fluid collection. LEFT LOWER EXTREMITY Common Femoral Vein: No evidence of thrombus. Normal compressibility, respiratory phasicity and response to augmentation. Saphenofemoral Junction: No evidence of thrombus. Normal compressibility and flow on color Doppler imaging. Profunda Femoral Vein: No evidence of thrombus. Normal compressibility and flow on color Doppler imaging. Femoral Vein: No evidence of thrombus. Normal compressibility, respiratory phasicity and response to augmentation. Popliteal Vein: No evidence of thrombus. Normal compressibility, respiratory phasicity and response to augmentation. Calf Veins: No evidence of thrombus. Normal  compressibility and flow on color Doppler imaging. Superficial Great Saphenous Vein: No evidence of thrombus. Normal compressibility. Venous Reflux:  None. Other Findings: No evidence of superficial thrombophlebitis or abnormal fluid collection. IMPRESSION: No evidence of deep venous thrombosis in either lower extremity. Electronically Signed   By: Marcey Moan M.D.   On: 03/08/2024 11:41   DG Chest Portable 1 View Result Date: 03/08/2024 CLINICAL DATA:  sob EXAM: PORTABLE CHEST 1 VIEW COMPARISON:  July 16th 2025, December 17, 2023, December 14, 2023 FINDINGS: The cardiomediastinal silhouette is unchanged in contour.Tracheostomy. Underlying emphysematous changes. No pleural effusion. No pneumothorax. Patchy reticular nodularity along the RIGHT  lower lateral lung base. IMPRESSION: Patchy reticular nodularity along the RIGHT lower lateral lung base. Differential considerations include atelectasis, aspiration or infection. Given underlying emphysema, recommend evaluation for candidacy for annual lung cancer screening CT. Electronically Signed   By: Corean Salter M.D.   On: 03/08/2024 10:11    Cardiac Studies I personally reviewed her echocardiogram which was done today and showed normal LV systolic function and wall motion with no significant valvular abnormalities.  Patient Profile   63 y.o. 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  Elevated troponin that peaked at 407 in the setting of COPD exacerbation with suspected pneumonia: No need for heparin  drip.  2.  Coronary artery disease: She is status post stent placement to the left circumflex in 2009 in the setting of STEMI.  Her echocardiogram today shows normal systolic function and wall motion.  Continue aspirin  and a statin.  Recommend outpatient ischemic cardiac evaluation  with a pharmacologic nuclear stress test.  3.  Hyperlipidemia: Continue high-dose rosuvastatin.  4.  COPD with trach in place: Continue management per pulmonary.     For questions or updates, please contact [facility] Please consult [email or web address] for contact info under       Signed, Deatrice Cage, MD  03/09/2024, 5:04 PM

## 2024-03-09 NOTE — Progress Notes (Signed)
   03/09/24 1315  Spiritual Encounters  Type of Visit Initial  Care provided to: Patient  Conversation partners present during encounter Nurse  Referral source Social worker/Care management/TOC  Reason for visit Advance directives  OnCall Visit Yes   Chaplain visited patient per a referral for an AD from SW.  Chaplain visited patient to offer and explain paperwork.  Chaplain saw patient was resting and asked if patient would like her to come back.  Patient said YES and confirmed it was not a good time.  Chaplain spoke to a Nurse stationed outside the patient's room and asked that staff member to share with patient's Nurse that if the patient would like the Chaplain to return to receive the AD paperwork, they can put in a Jenkins Consult to ensure the request is met according to what the patient's desires are.  Nurse understood and said she'd share with the assigned Nurse.  Rev. Rana M. Nicholaus, M.Div. Chaplain Resident [facility]

## 2024-03-09 NOTE — TOC CM/SW Note (Signed)
 Went by room for readmission prevention screen but patient was asleep. She did not wake up to CSW saying her name. Will try again later.  Lauraine Carpen, CSW [phone]

## 2024-03-09 NOTE — Consult Note (Signed)
 PHARMACY - ANTICOAGULATION CONSULT NOTE  Pharmacy Consult for heparin  infusion Indication: chest pain/ACS  Allergies  Allergen Reactions   Lorazepam      Makes anxiety worse  Other Reaction(s): Makes anxiety worse   Lisinopril  Cough   Amoxicillin      Patient has tolerated cephalosporins in the past    Patient Measurements: Height: 4' 10 (147.3 cm) Weight: 75 kg (165 lb 5.5 oz) IBW/kg (Calculated) : 40.9 HEPARIN  DW (KG): 57.6  Vital Signs: Temp: 97.6 F (36.4 C) (10/06 0400) Temp Source: Oral (10/06 0400) BP: 124/77 (10/06 0700) Pulse Rate: 55 (10/06 0700)  Labs: Recent Labs    03/08/24 1234 03/08/24 1433 03/08/24 1746 03/08/24 2210 03/09/24 0620 03/09/24 0628  HGB 11.7*  --   --   --  10.4*  --   HCT 38.6  --   --   --  33.1*  --   PLT 275  --   --   --  252  --   APTT  --  32  --   --   --   --   LABPROT  --  13.5  --   --   --   --   INR  --  1.0  --   --   --   --   HEPARINUNFRC  --   --  0.14* <0.10*  --  0.16*  CREATININE 0.80  --   --   --  0.83  --   CKTOTAL 180  --   --   --   --   --   TROPONINIHS 389* 407* 350*  --   --   --     Estimated Creatinine Clearance: 60.5 mL/min (by C-G formula based on SCr of 0.83 mg/dL).   Medical History: Past Medical History:  Diagnosis Date   Allergy    Anxiety    Asthma    CHF (congestive heart failure) (HCC)    Cocaine abuse (HCC)    COPD (chronic obstructive pulmonary disease) (HCC)    Coronary artery disease    Emphysema/COPD    Hepatitis C    High cholesterol    Hypertension    Pneumothorax    Tobacco abuse     Medications:  No home anticoagulants per pharmacist review  Assessment: 63 yo female presented to ED with complaint of shortness of breath.  PMH includes COPD and CAD s/p stent placement.  Patient found to have elevated troponin.  Pharmacy consulted to initiate heparin  infusion.  Baseline aPTT and PT/INR ordered.   Goal of Therapy:  Heparin  level 0.3-0.7 units/ml Monitor platelets by  anticoagulation protocol: Yes  10/6 0628 HL 0.16, subtherapeutic    Plan:  Bolus 1750 units x 1  Increase heparin  infusion to 1100 units/hr Recheck HL in 6 hrs after rate change CBC daily while on heparin   Bernardino George, PharmD Candidate [address] [facility] [employer] 03/09/2024 12:02 PM

## 2024-03-10 DIAGNOSIS — J9621 Acute and chronic respiratory failure with hypoxia: Secondary | ICD-10-CM | POA: Diagnosis not present

## 2024-03-10 DIAGNOSIS — I2489 Other forms of acute ischemic heart disease: Secondary | ICD-10-CM

## 2024-03-10 LAB — HEMOGLOBIN A1C
Hgb A1c MFr Bld: 8.7 % — ABNORMAL HIGH (ref 4.8–5.6)
Mean Plasma Glucose: 202.99 mg/dL

## 2024-03-10 LAB — CBC
HCT: 32.7 % — ABNORMAL LOW (ref 36.0–46.0)
Hemoglobin: 10.1 g/dL — ABNORMAL LOW (ref 12.0–15.0)
MCH: 25.5 pg — ABNORMAL LOW (ref 26.0–34.0)
MCHC: 30.9 g/dL (ref 30.0–36.0)
MCV: 82.6 fL (ref 80.0–100.0)
Platelets: 257 K/uL (ref 150–400)
RBC: 3.96 MIL/uL (ref 3.87–5.11)
RDW: 14.4 % (ref 11.5–15.5)
WBC: 7.7 K/uL (ref 4.0–10.5)
nRBC: 0 % (ref 0.0–0.2)

## 2024-03-10 LAB — MAGNESIUM: Magnesium: 2.2 mg/dL (ref 1.7–2.4)

## 2024-03-10 LAB — RENAL FUNCTION PANEL
Albumin: 3.3 g/dL — ABNORMAL LOW (ref 3.5–5.0)
Anion gap: 8 (ref 5–15)
BUN: 14 mg/dL (ref 8–23)
CO2: 24 mmol/L (ref 22–32)
Calcium: 8.8 mg/dL — ABNORMAL LOW (ref 8.9–10.3)
Chloride: 107 mmol/L (ref 98–111)
Creatinine, Ser: 0.68 mg/dL (ref 0.44–1.00)
GFR, Estimated: >60 mL/min (ref >60)
Glucose, Bld: 248 mg/dL — ABNORMAL HIGH (ref 70–99)
Phosphorus: 2.6 mg/dL (ref 2.5–4.6)
Potassium: 4.2 mmol/L (ref 3.5–5.1)
Sodium: 139 mmol/L (ref 135–145)

## 2024-03-10 LAB — HEPARIN LEVEL (UNFRACTIONATED): Heparin Unfractionated: 0.4 [IU]/mL (ref 0.30–0.70)

## 2024-03-10 LAB — GLUCOSE, CAPILLARY
Glucose-Capillary: 232 mg/dL — ABNORMAL HIGH (ref 70–99)
Glucose-Capillary: 230 mg/dL — ABNORMAL HIGH (ref 70–99)
Glucose-Capillary: 135 mg/dL — ABNORMAL HIGH (ref 70–99)
Glucose-Capillary: 130 mg/dL — ABNORMAL HIGH (ref 70–99)
Glucose-Capillary: 211 mg/dL — ABNORMAL HIGH (ref 70–99)

## 2024-03-10 LAB — LIPOPROTEIN A (LPA): Lipoprotein (a): 42.4 nmol/L — ABNORMAL HIGH (ref <75.0)

## 2024-03-10 MED ORDER — INSULIN ASPART 100 UNIT/ML IJ SOLN
0.0000 [IU] | Freq: Three times a day (TID) | INTRAMUSCULAR | Status: DC
  Administered 2024-03-10 – 2024-03-11 (×2): 1 [IU] via SUBCUTANEOUS
  Administered 2024-03-11 (×2): 3 [IU] via SUBCUTANEOUS
  Administered 2024-03-12: 2 [IU] via SUBCUTANEOUS
  Administered 2024-03-12: 3 [IU] via SUBCUTANEOUS
  Administered 2024-03-12: 2 [IU] via SUBCUTANEOUS
  Filled 2024-03-10 (×7): qty 1

## 2024-03-10 MED ORDER — INSULIN ASPART 100 UNIT/ML IJ SOLN
4.0000 [IU] | Freq: Three times a day (TID) | INTRAMUSCULAR | Status: DC
  Administered 2024-03-10 – 2024-03-12 (×7): 4 [IU] via SUBCUTANEOUS
  Filled 2024-03-10 (×7): qty 1

## 2024-03-10 MED ORDER — ENOXAPARIN SODIUM 40 MG/0.4ML IJ SOSY
40.0000 mg | PREFILLED_SYRINGE | INTRAMUSCULAR | Status: DC
  Administered 2024-03-10 – 2024-03-11 (×2): 40 mg via SUBCUTANEOUS
  Filled 2024-03-10 (×2): qty 0.4

## 2024-03-10 MED ORDER — INSULIN ASPART 100 UNIT/ML IJ SOLN: 0.0000 [IU] | Freq: Three times a day (TID) | INTRAMUSCULAR | Status: DC

## 2024-03-10 MED ORDER — INSULIN GLARGINE 100 UNIT/ML ~~LOC~~ SOLN
20.0000 [IU] | Freq: Two times a day (BID) | SUBCUTANEOUS | Status: DC
  Administered 2024-03-10 – 2024-03-12 (×4): 20 [IU] via SUBCUTANEOUS
  Filled 2024-03-10 (×4): qty 0.2

## 2024-03-10 MED ORDER — INSULIN ASPART 100 UNIT/ML IJ SOLN
0.0000 [IU] | Freq: Every day | INTRAMUSCULAR | Status: DC
  Administered 2024-03-10 – 2024-03-11 (×2): 2 [IU] via SUBCUTANEOUS
  Filled 2024-03-10 (×2): qty 1

## 2024-03-10 MED ORDER — AZITHROMYCIN 250 MG PO TABS
500.0000 mg | ORAL_TABLET | Freq: Every day | ORAL | Status: AC
  Administered 2024-03-10 – 2024-03-12 (×3): 500 mg via ORAL
  Filled 2024-03-10 (×3): qty 2

## 2024-03-10 NOTE — Progress Notes (Signed)
 Rounding Note   Patient Name: Michelle [name] Date of Encounter: 03/10/2024  [facility] Cardiologist: None   Subjective Patient was seen this morning and lying in bed.  She reports improvement in shortness of breath and denies any chest pain.  Scheduled Meds:  amLODipine   5 mg Oral Daily   aspirin  EC  81 mg Oral Daily   azithromycin   500 mg Oral Daily   Chlorhexidine  Gluconate Cloth  6 each Topical Daily   cloNIDine   0.1 mg Transdermal Weekly   enoxaparin  (LOVENOX ) injection  40 mg Subcutaneous Q24H   famotidine   40 mg Oral Daily   insulin  aspart  0-5 Units Subcutaneous QHS   insulin  aspart  0-9 Units Subcutaneous TID WC   insulin  aspart  4 Units Subcutaneous TID WC   insulin  glargine  20 Units Subcutaneous BID   ipratropium-albuterol   3 mL Nebulization Q6H   mirtazapine   45 mg Oral QHS   mouth rinse  15 mL Mouth Rinse Q2H   PARoxetine   30 mg Oral Daily   rosuvastatin  40 mg Oral Daily   Continuous Infusions:  PRN Meds: clonazePAM , diazepam , docusate sodium , hydrALAZINE , ondansetron  (ZOFRAN ) IV, mouth rinse, oxyCODONE -acetaminophen , polyethylene glycol   Vital Signs  Vitals:   03/10/24 0700 03/10/24 0800 03/10/24 0900 03/10/24 1000  BP: 115/69 129/68 134/73 136/75  Pulse: 69 65 71 77  Resp: 16 16 16 16   Temp:  98.9 F (37.2 C)    TempSrc:  Oral    SpO2: 100% 100% 100% 100%  Weight:      Height:        Intake/Output Summary (Last 24 hours) at 03/10/2024 1303 Last data filed at 03/10/2024 1000 Gross per 24 hour  Intake 490.99 ml  Output 900 ml  Net -409.01 ml      03/10/2024    5:00 AM 03/09/2024    5:00 AM 03/08/2024    9:27 AM  Last 3 Weights  Weight (lbs) 164 lb 10.9 oz 165 lb 5.5 oz 160 lb  Weight (kg) 74.7 kg 75 kg 72.576 kg      Telemetry Sinus rhythm with rate 70s-80s - Personally Reviewed  ECG  No new tracings- Personally Reviewed  Physical Exam GEN: No acute distress.   Neck: Unable to visualize JVP Cardiac: RRR, no murmurs, rubs,  or gallops.  Respiratory: Clear to auscultation bilaterally.  She has a trach in place GI: Soft, nontender, non-distended  MS: No edema; No deformity. Neuro:  Nonfocal  Psych: Normal affect   Labs High Sensitivity Troponin:   Recent Labs  Lab 03/08/24 1234 03/08/24 1433 03/08/24 1746  TROPONINIHS 389* 407* 350*     Chemistry Recent Labs  Lab 03/08/24 1234 03/09/24 0620 03/10/24 0417  NA 140 138 139  K 4.1 3.4* 4.2  CL 103 103 107  CO2 25 24 24   GLUCOSE 230* 263* 248*  BUN 18 21 14   CREATININE 0.80 0.83 0.68  CALCIUM  9.4 8.9 8.8*  MG  --  1.7 2.2  PROT 8.2*  --   --   ALBUMIN  3.5  --  3.3*  AST 22  --   --   ALT 12  --   --   ALKPHOS 86  --   --   BILITOT 0.6  --   --   GFRNONAA >60 >60 >60  ANIONGAP 12 11 8     Lipids  Recent Labs  Lab 03/08/24 1433  CHOL 191  TRIG 149  HDL 45  LDLCALC 116*  CHOLHDL 4.2    Hematology Recent Labs  Lab 03/08/24 1234 03/09/24 0620 03/10/24 0417  WBC 10.0 7.4 7.7  RBC 4.61 4.00 3.96  HGB 11.7* 10.4* 10.1*  HCT 38.6 33.1* 32.7*  MCV 83.7 82.8 82.6  MCH 25.4* 26.0 25.5*  MCHC 30.3 31.4 30.9  RDW 14.5 14.5 14.4  PLT 275 252 257   Thyroid   Recent Labs  Lab 03/08/24 1433  TSH 2.297    BNP Recent Labs  Lab 03/08/24 1234  BNP 162.6*    DDimer  Recent Labs  Lab 03/08/24 1433  DDIMER 0.38     Radiology  ECHOCARDIOGRAM COMPLETE Result Date: 03/09/2024    ECHOCARDIOGRAM REPORT   Patient Name:   Michelle [name] Date of Exam: 03/09/2024 Medical Rec #:  [id number]     Height:       58.0 in Accession #:    [id number]    Weight:       165.3 lb Date of Birth:  [birth year]     BSA:          1.680 m Patient Age:    63 years      BP:           124/77 mmHg Patient Gender: F             HR:           55 bpm. Exam Location:  [facility] Procedure: 2D Echo, Cardiac Doppler and Color Doppler (Both Spectral and Color            Flow Doppler were utilized during procedure). Indications:     NSTEMI I21.4  History:         Patient has prior  history of Echocardiogram examinations, most                  recent 09/03/2023. CHF, COPD; Risk Factors:Hypertension.  Sonographer:     Christopher Furnace Referring Phys:  [id number] TRAVIS SKIPINA Diagnosing Phys: Deatrice Cage MD  Sonographer Comments: Technically challenging study due to limited acoustic windows and suboptimal apical window. Pt on trach tube--kept supine. IMPRESSIONS  1. Left ventricular ejection fraction, by estimation, is 60 to 65%. The left ventricle has normal function. The left ventricle has no regional wall motion abnormalities. Left ventricular diastolic parameters are consistent with Grade I diastolic dysfunction (impaired relaxation).  2. Right ventricular systolic function is normal. The right ventricular size is normal. Tricuspid regurgitation signal is inadequate for assessing PA pressure.  3. The mitral valve is normal in structure. No evidence of mitral valve regurgitation. No evidence of mitral stenosis.  4. The aortic valve is normal in structure. Aortic valve regurgitation is not visualized. No aortic stenosis is present.  5. The inferior vena cava is normal in size with greater than 50% respiratory variability, suggesting right atrial pressure of 3 mmHg.  6. Technically challenging study FINDINGS  Left Ventricle: Left ventricular ejection fraction, by estimation, is 60 to 65%. The left ventricle has normal function. The left ventricle has no regional wall motion abnormalities. The left ventricular internal cavity size was normal in size. There is  no left ventricular hypertrophy. Left ventricular diastolic parameters are consistent with Grade I diastolic dysfunction (impaired relaxation). Right Ventricle: The right ventricular size is normal. No increase in right ventricular wall thickness. Right ventricular systolic function is normal. Tricuspid regurgitation signal is inadequate for assessing PA pressure. The tricuspid regurgitant velocity is 1.49 m/s, and with an assumed right atrial  pressure of 3 mmHg,  the estimated right ventricular systolic pressure is 11.9 mmHg. Left Atrium: Left atrial size was normal in size. Right Atrium: Right atrial size was normal in size. Pericardium: There is no evidence of pericardial effusion. Mitral Valve: The mitral valve is normal in structure. No evidence of mitral valve regurgitation. No evidence of mitral valve stenosis. MV peak gradient, 5.0 mmHg. The mean mitral valve gradient is 2.0 mmHg. Tricuspid Valve: The tricuspid valve is normal in structure. Tricuspid valve regurgitation is not demonstrated. No evidence of tricuspid stenosis. Aortic Valve: The aortic valve is normal in structure. Aortic valve regurgitation is not visualized. No aortic stenosis is present. Aortic valve mean gradient measures 2.0 mmHg. Aortic valve peak gradient measures 3.9 mmHg. Aortic valve area, by VTI measures 3.29 cm. Pulmonic Valve: The pulmonic valve was normal in structure. Pulmonic valve regurgitation is not visualized. No evidence of pulmonic stenosis. Aorta: The aortic root is normal in size and structure. Venous: The inferior vena cava is normal in size with greater than 50% respiratory variability, suggesting right atrial pressure of 3 mmHg. IAS/Shunts: No atrial level shunt detected by color flow Doppler.  LEFT VENTRICLE PLAX 2D LVIDd:         3.80 cm   Diastology LVIDs:         2.70 cm   LV e' medial:    7.72 cm/s LV PW:         0.90 cm   LV E/e' medial:  9.9 LV IVS:        1.10 cm   LV e' lateral:   4.68 cm/s LVOT diam:     2.00 cm   LV E/e' lateral: 16.3 LV SV:         63 LV SV Index:   38 LVOT Area:     3.14 cm  RIGHT VENTRICLE RV Basal diam:  2.70 cm RV Mid diam:    3.00 cm RV S prime:     10.60 cm/s TAPSE (M-mode): 1.8 cm LEFT ATRIUM           Index        RIGHT ATRIUM          Index LA diam:      3.20 cm 1.91 cm/m   RA Area:     9.87 cm LA Vol (A4C): 28.4 ml 16.91 ml/m  RA Volume:   20.60 ml 12.26 ml/m  AORTIC VALVE AV Area (Vmax):    2.78 cm AV Area  (Vmean):   2.66 cm AV Area (VTI):     3.29 cm AV Vmax:           98.30 cm/s AV Vmean:          66.500 cm/s AV VTI:            0.193 m AV Peak Grad:      3.9 mmHg AV Mean Grad:      2.0 mmHg LVOT Vmax:         87.00 cm/s LVOT Vmean:        56.400 cm/s LVOT VTI:          0.202 m LVOT/AV VTI ratio: 1.05  AORTA Ao Root diam: 2.30 cm MITRAL VALVE                TRICUSPID VALVE MV Area (PHT): 2.53 cm     TR Peak grad:   8.9 mmHg MV Area VTI:   1.92 cm     TR Vmax:  149.00 cm/s MV Peak grad:  5.0 mmHg MV Mean grad:  2.0 mmHg     SHUNTS MV Vmax:       1.12 m/s     Systemic VTI:  0.20 m MV Vmean:      61.8 cm/s    Systemic Diam: 2.00 cm MV Decel Time: 300 msec MV E velocity: 76.50 cm/s MV A velocity: 101.00 cm/s MV E/A ratio:  0.76 Deatrice Cage MD Electronically signed by Deatrice Cage MD Signature Date/Time: 03/09/2024/10:40:41 AM    Final    DG Chest Port 1 View Result Date: 03/09/2024 EXAM: 1 VIEW(S) XRAY OF THE CHEST 03/09/2024 05:05:50 AM COMPARISON: 03/08/2024 CLINICAL HISTORY: respiratory failure. Shortness of breath, respiratory failure FINDINGS: LINES, TUBES AND DEVICES: Tracheostomy tube in place with tip projecting over mid intrathoracic trachea. LUNGS AND PLEURA: Persistent right basilar patchy opacities. No pulmonary edema. Small left pleural effusion. Blunting of left costophrenic angle. No pneumothorax. HEART AND MEDIASTINUM: No acute abnormality of the cardiac and mediastinal silhouettes. BONES AND SOFT TISSUES: No acute osseous abnormality. IMPRESSION: 1. Persistent right basilar patchy opacities. 2. Small left pleural effusion. Electronically signed by: Evalene Coho MD 03/09/2024 05:17 AM EDT RP Workstation: [id number]    Cardiac Studies Echocardiogram 03/2024 1. Left ventricular ejection fraction, by estimation, is 60 to 65%. The  left ventricle has normal function. The left ventricle has no regional  wall motion abnormalities. Left ventricular diastolic parameters are   consistent with Grade I diastolic  dysfunction (impaired relaxation).   2. Right ventricular systolic function is normal. The right ventricular  size is normal. Tricuspid regurgitation signal is inadequate for assessing  PA pressure.   3. The mitral valve is normal in structure. No evidence of mitral valve  regurgitation. No evidence of mitral stenosis.   4. The aortic valve is normal in structure. Aortic valve regurgitation is  not visualized. No aortic stenosis is present.   5. The inferior vena cava is normal in size with greater than 50%  respiratory variability, suggesting right atrial pressure of 3 mmHg.   6. Technically challenging study  __________________________  Echocardiogram 09/2023  1. Left ventricular ejection fraction, by estimation, is >55%. The left  ventricle has normal function. Left ventricular endocardial border not  optimally defined to evaluate regional wall motion. There is moderate left  ventricular hypertrophy. Left  ventricular diastolic parameters are consistent with Grade I diastolic  dysfunction (impaired relaxation).   2. Right ventricular systolic function is normal. The right ventricular  size is mildly enlarged. There is normal pulmonary artery systolic  pressure.   3. The mitral valve was not well visualized. No evidence of mitral valve  regurgitation.   4. The aortic valve was not well visualized. Aortic valve regurgitation  is not visualized. No aortic stenosis is present.   5. Pulmonic valve regurgitation not well-assessed.   6. The inferior vena cava is normal in size with <50% respiratory  variability, suggesting right atrial pressure of 8 mmHg.  ____________________  CTPA 08/2023  The thoracic aorta is normal in caliber. Moderate to severe calcified and noncalcified atherosclerotic plaque. Atherosclerotic plaque of the thoracic aorta. Four-vessel coronary artery calcifications.   Patient Profile   63 y.o. 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 Ernest.   Assessment & Plan  1.  Elevated troponin  -  Peaked at 407 and downtrending  -Patient does not describe symptoms indicative of ACS and reports that this does not feel similar to when they had an MI back in 2009 - Suspect demand ischemia in setting of COPD exacerbation with suspected pneumonia; heparin  drip stopped   2.  Coronary artery disease -She is status post stent placement to the left circumflex in 2009 in the setting of STEMI.  -Echocardiogram 03/2024 shows normal systolic function and wall motion.   -Continue aspirin  and a statin - Recommend outpatient ischemic cardiac evaluation with a pharmacologic nuclear stress test.   3.  Hyperlipidemia - Continue high-dose rosuvastatin.   4.  COPD with trach in place - Continue management per pulmonary.     For questions or updates, please contact [facility] Please consult [email or web address] for contact info under       Signed, Ronold Hardgrove, NP  03/10/2024, 1:03 PM

## 2024-03-10 NOTE — Plan of Care (Signed)
  Problem: Clinical Measurements: Goal: Respiratory complications will improve Outcome: Progressing Goal: Cardiovascular complication will be avoided Outcome: Progressing   Problem: Coping: Goal: Level of anxiety will decrease Outcome: Progressing   

## 2024-03-10 NOTE — Consult Note (Signed)
 PHARMACY - ANTICOAGULATION CONSULT NOTE  Pharmacy Consult for heparin  infusion Indication: chest pain/ACS  Allergies  Allergen Reactions   Lorazepam      Makes anxiety worse  Other Reaction(s): Makes anxiety worse   Lisinopril  Cough   Amoxicillin      Patient has tolerated cephalosporins in the past    Patient Measurements: Height: 4' 10 (147.3 cm) Weight: 74.7 kg (164 lb 10.9 oz) IBW/kg (Calculated) : 40.9 HEPARIN  DW (KG): 57.6  Vital Signs: Temp: 98.3 F (36.8 C) (10/07 0400) Temp Source: Oral (10/07 0400) BP: 147/81 (10/07 0400) Pulse Rate: 56 (10/07 0400)  Labs: Recent Labs    03/08/24 1234 03/08/24 1433 03/08/24 1746 03/08/24 2210 03/09/24 0620 03/09/24 0628 03/09/24 2130 03/10/24 0417  HGB 11.7*  --   --   --  10.4*  --   --  10.1*  HCT 38.6  --   --   --  33.1*  --   --  32.7*  PLT 275  --   --   --  252  --   --  257  APTT  --  32  --   --   --   --   --   --   LABPROT  --  13.5  --   --   --   --   --   --   INR  --  1.0  --   --   --   --   --   --   HEPARINUNFRC  --   --  0.14*   < >  --  0.16* 0.17* 0.40  CREATININE 0.80  --   --   --  0.83  --   --   --   CKTOTAL 180  --   --   --   --   --   --   --   TROPONINIHS 389* 407* 350*  --   --   --   --   --    < > = values in this interval not displayed.    Estimated Creatinine Clearance: 60.4 mL/min (by C-G formula based on SCr of 0.83 mg/dL).   Medical History: Past Medical History:  Diagnosis Date   Allergy    Anxiety    Asthma    CHF (congestive heart failure) (HCC)    Cocaine abuse (HCC)    COPD (chronic obstructive pulmonary disease) (HCC)    Coronary artery disease    Emphysema/COPD    Hepatitis C    High cholesterol    Hypertension    Pneumothorax    Tobacco abuse     Medications:  No home anticoagulants per pharmacist review  Assessment: 63 yo female presented to ED with complaint of shortness of breath.  PMH includes COPD and CAD s/p stent placement.  Patient found to have  elevated troponin.  Pharmacy consulted to initiate heparin  infusion.  Baseline aPTT and PT/INR ordered.   Goal of Therapy:  Heparin  level 0.3-0.7 units/ml Monitor platelets by anticoagulation protocol: Yes  Date Time HL Rate/Comment  10/6    0628    0.16   Subtherapeutic  10/6 2130 0.17 Subtherapeutic 10/7    0417     0.40    Therapeutic X 1   Plan:  10/7:  HL @ 0417 = 0.40, therapeutic X 1 - will continue pt on current rate and recheck HL in 6 hrs - CBC daily while on heparin   Merdis Snodgrass D, PharmD Clinical Pharmacist 03/10/2024 5:30 AM

## 2024-03-10 NOTE — Progress Notes (Signed)
 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  She appears to have developled interval pleural effusion. She is on home ventilator.  She is having her meds refined adding paroxetine  and her home insulin .    03/10/24- patient remains on MV, she no longer requires heparin  gtt, we will start SCD and lovenox  [place] for dvt ppx.  Her diabetic therapy is being refined today due to persistent hyperglycemia.  PT/OT for VDRF deconditioning.  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, HTN, HLD, CHF, CAD s/p STEMI s/ stent 2009, DM type 2 w/ insulin  dependence, Hepatitis C, Former tobacco and cocaine use, Anxiety/Depression, and Obesity    Significant Hospital Events   RECURRENT ADMISSIONS in the PAST   Antimicrobials:  Zosyn    Significant Hospital Events: Including procedures, antibiotic start and stop dates in addition to other pertinent events   10/5 Admit to ICU on home ventilator with acute on chronic respiratory failure iso suspected Pneumonia, AECOPD and UTI  Trache site clean/ #6 Shiley tracheostomy, on home VENT   Objective   Blood pressure 115/69, pulse 69, temperature 98.3 F (36.8 C), temperature source Oral, resp. rate 16, height 4' 10 (1.473 m), weight 74.7 kg, last menstrual period 03/06/2005, SpO2 100%.    Vent Mode: SIMV;PSV Set Rate:  [16  bmp] 16 bmp Vt Set:  [400 mL] 400 mL PEEP:  [5 cmH20] 5 cmH20 Pressure Support:  [10 cmH20] 10 cmH20   Intake/Output Summary (Last 24 hours) at 03/10/2024 0805 Last data filed at 03/10/2024 0700 Gross per 24 hour  Intake 935.27 ml  Output 900 ml  Net 35.27 ml   Filed Weights   03/08/24 0927 03/09/24 0500 03/10/24 0500  Weight: 72.6 kg 75 kg 74.7 kg       Review of Systems: Gen:  Denies  fever, sweats, chills weight loss  HEENT: Denies blurred vision, double vision, ear pain, eye pain, hearing loss, nose bleeds, sore throat Cardiac:  No dizziness, chest pain or heaviness, chest tightness,edema, No JVD Resp:   No cough, -sputum production, +shortness of breath,-wheezing, -hemoptysis,  Other:  All other systems negative   Physical Examination:   General Appearance: No distress  EYES PERRLA, EOM intact.   NECK Supple, No JVD Pulmonary: normal breath sounds, No wheezing.  CardiovascularNormal S1,S2.  No m/r/g.   Abdomen: Benign, Soft, non-tender. Neurology UE/LE 5/5 strength, no focal deficits Ext pulses intact, cap refill intact ALL OTHER ROS ARE NEGATIVE    Labs/imaging that I havepersonally reviewed  (right click and Reselect all SmartList Selections daily)     ASSESSMENT AND PLAN SYNOPSIS  Acute on Chronic Hypoxic & Hypercapnic Respiratory failure in the setting of ... Acute COPD Exacerbation  Suspected Pneumonia PMHx: COPD/Emphysema/Asthma with chronic tracheostomy and home vent.  -Vent support per home setting -titrate FiO2, PEEP to maintain O2 sat >90%  -Lung protective ventilation  -PRN Chest X-ray &  ABG -PRN and scheduled bronchodilators, hypertonic saline -Start systemic steroid IV ABX   NSTEMI Heparin  infusion Cardiology consulted -Prn Valium  -Resume outpatient Klonopin  Continue aspririn   ENDO - ICU hypoglycemic\Hyperglycemia protocol -check FSBS per protocol   GI GI PROPHYLAXIS as indicated NUTRITIONAL STATUS DIET-->as  tolerated Constipation protocol as indicated   ELECTROLYTES -follow labs as needed -replace as needed -pharmacy consultation and following  RESTRICTIVE TRANSFUSION PROTOCOL TRANSFUSION  IF HGB<7  or ACTIVE BLEEDING OR DX of ACUTE CORONARY SYNDROMES     Best practice (right click and Reselect all SmartList Selections daily)  VAP protocol (if indicated): Yes DVT prophylaxis: Systemic AC GI prophylaxis: H2B Mobility:  bed rest  Code Status:  FULL Disposition:ICU  Labs   CBC: Recent Labs  Lab 03/08/24 1234 03/09/24 0620 03/10/24 0417  WBC 10.0 7.4 7.7  NEUTROABS 7.8*  --   --   HGB 11.7* 10.4* 10.1*  HCT 38.6 33.1* 32.7*  MCV 83.7 82.8 82.6  PLT 275 252 257    Basic Metabolic Panel: Recent Labs  Lab 03/08/24 1234 03/09/24 0620 03/10/24 0417  NA 140 138 139  K 4.1 3.4* 4.2  CL 103 103 107  CO2 25 24 24   GLUCOSE 230* 263* 248*  BUN 18 21 14   CREATININE 0.80 0.83 0.68  CALCIUM  9.4 8.9 8.8*  MG  --  1.7 2.2  PHOS  --  2.5 2.6   GFR: Estimated Creatinine Clearance: 62.6 mL/min (by C-G formula based on SCr of 0.68 mg/dL). Recent Labs  Lab 03/08/24 1234 03/08/24 1445 03/09/24 0620 03/10/24 0417  WBC 10.0  --  7.4 7.7  LATICACIDVEN 1.3 1.2  --   --     Liver Function Tests: Recent Labs  Lab 03/08/24 1234 03/10/24 0417  AST 22  --   ALT 12  --   ALKPHOS 86  --   BILITOT 0.6  --   PROT 8.2*  --   ALBUMIN  3.5 3.3*   Recent Labs  Lab 03/08/24 1234  LIPASE 41   No results for input(s): AMMONIA in the last 168 hours.  ABG    Component Value Date/Time   PHART 7.42 03/08/2024 1433   PCO2ART 53 (H) 03/08/2024 1433   PO2ART 64 (L) 03/08/2024 1433   HCO3 34.4 (H) 03/08/2024 1433   ACIDBASEDEF 1.5 08/29/2023 0307   O2SAT 94.6 03/08/2024 1433     Coagulation Profile: Recent Labs  Lab 03/08/24 1433  INR 1.0    Cardiac Enzymes: Recent Labs  Lab 03/08/24 1234  CKTOTAL 180    HbA1C: Hemoglobin A1C  Date/Time Value Ref Range  Status  06/29/2013 04:02 AM 6.0 4.2 - 6.3 % Final    Comment:    The American Diabetes Association recommends that a primary goal of therapy should be <7% and that physicians should reevaluate the treatment regimen in patients with HbA1c values consistently >8%.    Hgb A1c MFr Bld  Date/Time Value Ref Range Status  08/29/2023 01:01 AM 9.4 (H) 4.8 - 5.6 % Final    Comment:    (NOTE) Pre diabetes:          5.7%-6.4%  Diabetes:              >6.4%  Glycemic control for   <7.0% adults with diabetes   02/19/2023 04:33 AM 10.3 (H) 4.8 - 5.6 % Final    Comment:    (NOTE)         Prediabetes: 5.7 - 6.4  Diabetes: >6.4         Glycemic control for adults with diabetes: <7.0     CBG: Recent Labs  Lab 03/08/24 1603 03/09/24 1734 03/09/24 2135 03/10/24 0724  GLUCAP 173* 255* 207* 232*     Past Medical History:  She,  has a past medical history of Allergy, Anxiety, Asthma, CHF (congestive heart failure) (HCC), Cocaine abuse (HCC), COPD (chronic obstructive pulmonary disease) (HCC), Coronary artery disease, Emphysema/COPD, Hepatitis C, High cholesterol, Hypertension, Pneumothorax, and Tobacco abuse.   Surgical History:   Past Surgical History:  Procedure Laterality Date   CARDIAC CATHETERIZATION     CHEST TUBE INSERTION     IR GASTROSTOMY TUBE MOD SED  10/31/2017   TRACHEOSTOMY TUBE PLACEMENT N/A 10/17/2017   Procedure: TRACHEOSTOMY;  Surgeon: Milissa Hamming, MD;  Location: [facility] ORS;  Service: ENT;  Laterality: N/A;     Social History:   reports that she quit smoking about 6 years ago. Her smoking use included cigarettes. She started smoking about 47 years ago. She has a 4.1 pack-year smoking history. She has never used smokeless tobacco. She reports that she does not currently use drugs after having used the following drugs: Crack cocaine. She reports that she does not drink alcohol.   Family History:  Her family history includes Asthma in her mother; Breast  cancer (age of onset: 81) in her maternal aunt; CAD in her father; COPD in her sister; CVA in her father; Lung cancer in her mother; Stroke in her father.   Allergies Allergies  Allergen Reactions   Lorazepam      Makes anxiety worse  Other Reaction(s): Makes anxiety worse   Lisinopril  Cough   Amoxicillin      Patient has tolerated cephalosporins in the past    IMAGING     Home Medications  Prior to Admission medications   Medication Sig Start Date End Date Taking? Authorizing Provider  acetaminophen  (TYLENOL ) 325 MG tablet Take 2 tablets (650 mg total) by mouth every 6 (six) hours as needed for mild pain (pain score 1-3) (or Fever >/= 101). 04/24/23   Dorinda Drue DASEN, MD  albuterol  (PROVENTIL ) (2.5 MG/3ML) 0.083% nebulizer solution Take 3 mLs (2.5 mg total) by nebulization every 6 (six) hours as needed for wheezing or shortness of breath. 03/27/23   Tamea Dedra CROME, MD  albuterol  (VENTOLIN  HFA) 108 [phone] Base) MCG/ACT inhaler Inhale 2 puffs into the lungs every 6 (six) hours as needed for wheezing or shortness of breath. 04/02/22   Parrett, Madelin RAMAN, NP  amLODipine  (NORVASC ) 5 MG tablet Take 5 mg by mouth daily. 08/27/21   [provider]  aspirin  81 MG chewable tablet Chew 81 mg by mouth daily.     [provider]  azelastine  (ASTELIN ) 0.1 % nasal spray SMARTSIG:1-2 Spray(s) Both Nares Every 12 Hours PRN 09/22/20   [provider]  Blood Glucose Monitoring Suppl ([employer] BLOOD GLUCOSE METER) w/Device KIT Use as directed to monitor blood glucose up to qid 02/22/23   Alexander, Natalie, DO  cefdinir (OMNICEF) 300 MG capsule Take 1 capsule (300 mg total) by mouth 2 (two) times daily. 03/02/24   Brimage, Vondra, DO  cetirizine  (ZYRTEC ) 10 MG tablet Take 1 tablet (10 mg total) by mouth daily as needed for allergies. Home med. 12/20/23   Awanda City, MD  clonazePAM  (KLONOPIN ) 0.5 MG tablet Take 0.5 mg by mouth in the morning, at noon, and at bedtime.    [provider]   cloNIDine  (CATAPRES  - DOSED IN  MG/24 HR) 0.1 mg/24hr patch Place 0.1 mg onto the skin once a week.    [provider]  docusate sodium  (COLACE) 100 MG capsule Take 2 capsules (200 mg total) by mouth daily as needed for mild constipation. 04/24/23   Dorinda Drue DASEN, MD  famotidine  (PEPCID ) 20 MG tablet Take 1 tablet by mouth daily as needed for heartburn 09/24/23   [provider]  fluconazole  (DIFLUCAN ) 150 MG tablet Take 1 tablet (150 mg total) by mouth once a week. 03/02/24   Brimage, Vondra, DO  fluticasone  (FLONASE ) 50 MCG/ACT nasal spray Place 1 spray into both nostrils daily as needed for allergies or rhinitis. Home med. 12/20/23   Awanda City, MD  guaiFENesin  (MUCINEX ) 600 MG 12 hr tablet Take 600 mg by mouth 2 (two) times daily.    [provider]  hydrOXYzine  (VISTARIL ) 25 MG capsule Take 25 mg by mouth every 6 (six) hours as needed. 10/01/22   [provider]  ipratropium-albuterol  (DUONEB) 0.5-2.5 (3) MG/3ML SOLN Take 3 mLs by nebulization every 6 (six) hours as needed (for shortness of breath). 06/17/23   Kasa, Kurian, MD  LANTUS  SOLOSTAR 100 UNIT/ML Solostar Pen Inject 40 Units into the skin at bedtime. 01/03/23   [provider]  mirtazapine  (REMERON ) 30 MG tablet Take 1 tablet by mouth every night at bedtime 10/17/23   [provider]  Multiple Vitamin (MULTIVITAMIN WITH MINERALS) TABS tablet Take 1 tablet by mouth daily. 12/20/23   Awanda City, MD  naloxone  (NARCAN ) nasal spray 4 mg/0.1 mL Place 1 spray into the nose once as needed for up to 1 dose. Home med. 12/20/23   Awanda City, MD  NOVOLOG  FLEXPEN 100 UNIT/ML FlexPen Inject 5-10 Units into the skin 3 (three) times daily with meals. Sliding scale mealtime insulin . 03/01/21   [provider]  nystatin  ointment (MYCOSTATIN ) Apply 1 Application topically 3 (three) times daily as needed. Home med. 12/20/23   Awanda City, MD  oxyCODONE -acetaminophen  (PERCOCET/ROXICET) 5-325 MG tablet Take 1  tablet by mouth 3 (three) times daily as needed for pain. 09/07/21   [provider]  PARoxetine  (PAXIL ) 30 MG tablet Take 30 mg by mouth daily. 09/19/23   [provider]  polyethylene glycol powder (GLYCOLAX /MIRALAX ) 17 GM/SCOOP powder Take 17 g by mouth daily as needed. Home med. 12/20/23   Awanda City, MD  PULMICORT  0.5 MG/2ML nebulizer solution Take 0.5 mg by nebulization every 12 (twelve) hours. 02/13/21   [provider]  terbinafine (LAMISIL) 1 % cream Apply 1 application  topically 2 (two) times daily. To affected skin, for 1 week after resolution of rash. # 30 gm, 1 refill.    [provider]  TRULICITY 4.5 MG/0.5ML SOPN Inject 4.5 mg into the skin once a week. 09/28/21   [provider]  Vitamin D , Ergocalciferol , (DRISDOL ) 1.25 MG (50000 UNIT) CAPS capsule Take 50,000 Units by mouth every 7 (seven) days.    [provider]  fluconazole  (DIFLUCAN ) 150 MG tablet Take 1 tablet day 1 repeat in 1 week 11/15/20   Moye, [place] , MD      Critical care provider statement:   Total critical care time: 33 minutes   Performed by: Parris MD   Critical care time was exclusive of separately billable procedures and treating other patients.   Critical care was necessary to treat or prevent imminent or life-threatening deterioration.   Critical care was time spent personally by me on the following activities: development of treatment plan with  patient and/or surrogate as well as nursing, discussions with consultants, evaluation of patient's response to treatment, examination of patient, obtaining history from patient or surrogate, ordering and performing treatments and interventions, ordering and review of laboratory studies, ordering and review of radiographic studies, pulse oximetry and re-evaluation of patient's condition.    Hollynn Garno, M.D.  Pulmonary & Critical Care Medicine

## 2024-03-10 NOTE — Plan of Care (Signed)

## 2024-03-10 NOTE — Inpatient Diabetes Management (Signed)
 Inpatient Diabetes Program Recommendations  AACE/ADA: New Consensus Statement on Inpatient Glycemic Control (2015)  Target Ranges:  Prepandial:   less than 140 mg/dL      Peak postprandial:   less than 180 mg/dL (1-2 hours)      Critically ill patients:  140 - 180 mg/dL   Lab Results  Component Value Date   GLUCAP 230 (H) 03/10/2024   HGBA1C 9.4 (H) 08/29/2023      Latest Reference Range & Units 03/08/24 16:03 03/09/24 17:34 03/09/24 21:35 03/10/24 07:24 03/10/24 11:27  Glucose-Capillary 70 - 99 mg/dL 826 (H) 744 (H) 792 (H) 232 (H) 230 (H)  (H): Data is abnormally high  Diabetes history: DM2 Outpatient Diabetes medications:  Lantus  40 units daily Novolog  5-10 units tid meal coverage Trulicity 4.5 mg weekly Current orders for Inpatient glycemic control:  Lantus  20 units daily Novolog  0-15 units tid, 0-5 units hs  Inpatient Diabetes Program Recommendations:   Please consider: -Novolog  4 units tid meal coverage if eats 50% meal -Add carb mod to diet if appropriate -Decrease Novolog  correction to 0-9 units tid, 0-5 units hs   Thank you, Breton Berns E. Alyssa Mancera, RN, MSN, CNS, CDCES  Diabetes Coordinator Inpatient Glycemic Control Team Team Pager [phone] (8am-5pm) 03/10/2024 11:57 AM

## 2024-03-11 ENCOUNTER — Inpatient Hospital Stay

## 2024-03-11 LAB — RENAL FUNCTION PANEL
Albumin: 3.4 g/dL — ABNORMAL LOW (ref 3.5–5.0)
Anion gap: 8 (ref 5–15)
BUN: 10 mg/dL (ref 8–23)
CO2: 24 mmol/L (ref 22–32)
Calcium: 9.1 mg/dL (ref 8.9–10.3)
Chloride: 107 mmol/L (ref 98–111)
Creatinine, Ser: 0.83 mg/dL (ref 0.44–1.00)
GFR, Estimated: >60 mL/min (ref >60)
Glucose, Bld: 215 mg/dL — ABNORMAL HIGH (ref 70–99)
Phosphorus: 3.6 mg/dL (ref 2.5–4.6)
Potassium: 4 mmol/L (ref 3.5–5.1)
Sodium: 139 mmol/L (ref 135–145)

## 2024-03-11 LAB — GLUCOSE, CAPILLARY
Glucose-Capillary: 202 mg/dL — ABNORMAL HIGH (ref 70–99)
Glucose-Capillary: 204 mg/dL — ABNORMAL HIGH (ref 70–99)
Glucose-Capillary: 147 mg/dL — ABNORMAL HIGH (ref 70–99)
Glucose-Capillary: 207 mg/dL — ABNORMAL HIGH (ref 70–99)

## 2024-03-11 LAB — CBC
HCT: 35.1 % — ABNORMAL LOW (ref 36.0–46.0)
Hemoglobin: 10.7 g/dL — ABNORMAL LOW (ref 12.0–15.0)
MCH: 25.6 pg — ABNORMAL LOW (ref 26.0–34.0)
MCHC: 30.5 g/dL (ref 30.0–36.0)
MCV: 84 fL (ref 80.0–100.0)
Platelets: 277 K/uL (ref 150–400)
RBC: 4.18 MIL/uL (ref 3.87–5.11)
RDW: 14.1 % (ref 11.5–15.5)
WBC: 7.6 K/uL (ref 4.0–10.5)
nRBC: 0 % (ref 0.0–0.2)

## 2024-03-11 LAB — MAGNESIUM: Magnesium: 2 mg/dL (ref 1.7–2.4)

## 2024-03-11 MED ORDER — POLYETHYLENE GLYCOL 3350 17 G PO PACK
17.0000 g | PACK | Freq: Every day | ORAL | Status: DC
  Administered 2024-03-11: 17 g via ORAL
  Filled 2024-03-11 (×2): qty 1

## 2024-03-11 MED ORDER — IPRATROPIUM-ALBUTEROL 0.5-2.5 (3) MG/3ML IN SOLN
RESPIRATORY_TRACT | Status: AC
  Filled 2024-03-11: qty 3

## 2024-03-11 MED ORDER — IPRATROPIUM-ALBUTEROL 0.5-2.5 (3) MG/3ML IN SOLN
3.0000 mL | RESPIRATORY_TRACT | Status: DC | PRN
  Administered 2024-03-11 – 2024-03-12 (×3): 3 mL via RESPIRATORY_TRACT
  Filled 2024-03-11 (×2): qty 3

## 2024-03-11 NOTE — Plan of Care (Signed)

## 2024-03-11 NOTE — Progress Notes (Addendum)
   03/11/24 0930  Spiritual Encounters  Type of Visit Initial  Care provided to: Patient  Referral source Other (comment) ([place])  Reason for visit Advance directives  OnCall Visit Yes   Chaplain visited with patient due to a [place] Consult for an AD being put in the EPIC system. Chaplain explained the document and patient understood but said she feels confident her 2 daughters will honor her wishes and doesn't want to do the document.  Chaplain also got to share this with patient's daughter who came into the room during the visit.  Chaplain asked patient if she had any other needs and patient requested prayer for her health.  Chaplain prayed with patient.    Rev. Rana M. Nicholaus, M.Div. Chaplain [facility]

## 2024-03-11 NOTE — Inpatient Diabetes Management (Signed)
 Inpatient Diabetes Program Recommendations  AACE/ADA: New Consensus Statement on Inpatient Glycemic Control   Target Ranges:  Prepandial:   less than 140 mg/dL      Peak postprandial:   less than 180 mg/dL (1-2 hours)      Critically ill patients:  140 - 180 mg/dL    Latest Reference Range & Units 03/10/24 07:24 03/10/24 11:27 03/10/24 16:28 03/10/24 17:44 03/10/24 21:32 03/11/24 07:26  Glucose-Capillary 70 - 99 mg/dL 767 (H) 769 (H) 864 (H) 130 (H) 211 (H) 202 (H)   Review of Glycemic Control  Diabetes history: DM2 Outpatient Diabetes medications: Lantus  40 units daily, Novolog  5-10 units TID with meals, Trulicity 4.5 mg Qweek Current orders for Inpatient glycemic control: Lantus  20 units BID, Novolog  0-9 units TID with meals, Novolog  0-5 units at bedtime, Novolog  4 units TID with meals  Inpatient Diabetes Program Recommendations:    Insulin : Please consider increasing Lantus  to 23 units BID.  Thanks, Earnie Gainer, RN, MSN, CDCES Diabetes Coordinator Inpatient Diabetes Program [phone] (Team Pager from 8am to 5pm)

## 2024-03-11 NOTE — Evaluation (Signed)
 Physical Therapy Evaluation Patient Details Name: Michelle [name] MRN: [id number] DOB: 1961/01/27 Today's Date: 03/11/2024  History of Present Illness  Patient is a 63 year old female with acute on rhonic respiratory failure. PMH: trach on vent at home, HTN, HLD, CHF, CAD s/p STEMI, DM, Hep C, MRSA and Pseudomonas pneumonia, Atypical pneumonia, COPD/Emphysema/Asthma.   Clinical Impression  Patient is agreeable to PT session. She lives with her daughter who provides assistance with bed mobility and pivot transfers when required. Today the patient was agreeable to PT session. She required minimal assistance to sit up on edge of bed with good sitting balance. She was able to roll several times in bed with minimal assistance for repositioning. She does report anxiety and requested medication. Vitals stable during session with Sp02 98-100% throughout. Recommend to continue PT to maximize independence and decrease caregiver burden. Patient is likely near her baseline level of functional independence and is hopeful for discharge home soon with continued caregiver support from family.       If plan is discharge home, recommend the following: A little help with walking and/or transfers;A little help with bathing/dressing/bathroom;Assistance with cooking/housework;Help with stairs or ramp for entrance;Assist for transportation   Can travel by private vehicle        Equipment Recommendations None recommended by PT  Recommendations for Other Services       Functional Status Assessment Patient has had a recent decline in their functional status and demonstrates the ability to make significant improvements in function in a reasonable and predictable amount of time.     Precautions / Restrictions Precautions Precautions: Fall Recall of Precautions/Restrictions: Intact Precaution/Restrictions Comments: trach with vent support Restrictions Weight Bearing Restrictions Per Provider Order: No       Mobility  Bed Mobility Overal bed mobility: Needs Assistance Bed Mobility: Supine to Sit, Sit to Supine, Rolling Rolling: Min assist   Supine to sit: Min assist Sit to supine: Min assist   General bed mobility comments: cues for technique    Transfers                   General transfer comment: not attempted. patient anxious and requesting to return to bed. she pivots with assistance at baseline    Ambulation/Gait                  Stairs            Wheelchair Mobility     Tilt Bed    Modified Rankin (Stroke Patients Only)       Balance Overall balance assessment: Needs assistance Sitting-balance support: Bilateral upper extremity supported Sitting balance-Leahy Scale: Good                                       Pertinent Vitals/Pain Pain Assessment Pain Assessment: Faces Faces Pain Scale: Hurts a little bit Pain Location: legs Pain Descriptors / Indicators: Discomfort Pain Intervention(s): Limited activity within patient's tolerance, Monitored during session, Repositioned    Home Living Family/patient expects to be discharged to:: Private residence Living Arrangements: Children Available Help at Discharge: Family;Available 24 hours/day Type of Home: Mobile home Home Access: Ramped entrance       Home Layout: One level Home Equipment: Wheelchair - manual      Prior Function Prior Level of Function : Needs assist             Mobility  Comments: pivot transfer with assistance. bed mobility with assistance. ADLs Comments: assistance provided from daughter     Extremity/Trunk Assessment   Upper Extremity Assessment Upper Extremity Assessment: Overall WFL for tasks assessed    Lower Extremity Assessment Lower Extremity Assessment: Generalized weakness    Cervical / Trunk Assessment Cervical / Trunk Assessment:  (trach)  Communication   Communication Communication: Impaired Factors Affecting  Communication: Trach/intubated    Cognition Arousal: Alert Behavior During Therapy: Anxious, WFL for tasks assessed/performed   PT - Cognitive impairments: No apparent impairments                       PT - Cognition Comments: patient does request anxiety medication after mobilizing, RN administered during session Following commands: Intact       Cueing Cueing Techniques: Verbal cues     General Comments General comments (skin integrity, edema, etc.): Sp02 98-100% throughout session.    Exercises     Assessment/Plan    PT Assessment Patient needs continued PT services  PT Problem List Decreased strength;Decreased range of motion;Decreased activity tolerance;Decreased balance;Decreased mobility;Decreased safety awareness       PT Treatment Interventions DME instruction;Functional mobility training;Therapeutic activities;Therapeutic exercise;Balance training;Neuromuscular re-education;Cognitive remediation;Wheelchair mobility training    PT Goals (Current goals can be found in the Care Plan section)  Acute Rehab PT Goals Patient Stated Goal: to go home soon PT Goal Formulation: With patient Time For Goal Achievement: 03/25/24 Potential to Achieve Goals: Fair    Frequency Min 1X/week     Co-evaluation               AM-PAC PT 6 Clicks Mobility  Outcome Measure Help needed turning from your back to your side while in a flat bed without using bedrails?: None Help needed moving from lying on your back to sitting on the side of a flat bed without using bedrails?: A Little Help needed moving to and from a bed to a chair (including a wheelchair)?: A Little Help needed standing up from a chair using your arms (e.g., wheelchair or bedside chair)?: A Little Help needed to walk in hospital room?: Total Help needed climbing 3-5 steps with a railing? : Total 6 Click Score: 15    End of Session Equipment Utilized During Treatment: Oxygen (vent support) Activity  Tolerance: Patient tolerated treatment well Patient left: in bed;with call bell/phone within reach;with nursing/sitter in room Nurse Communication: Mobility status PT Visit Diagnosis: Muscle weakness (generalized) (M62.81);Unsteadiness on feet (R26.81)    Time: 9163-9144 PT Time Calculation (min) (ACUTE ONLY): 19 min   Charges:   PT Evaluation $PT Eval Moderate Complexity: 1 Mod   PT General Charges $$ ACUTE PT VISIT: 1 Visit        Randine Essex, PT, MPT  Randine LULLA Essex 03/11/2024, 9:13 AM

## 2024-03-11 NOTE — Progress Notes (Signed)
 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  She appears to have developled interval pleural effusion. She is on home ventilator.  She is having her meds refined adding paroxetine  and her home insulin .    03/10/24- patient remains on MV, she no longer requires heparin  gtt, we will start SCD and lovenox  [place] for dvt ppx.  Her diabetic therapy is being refined today due to persistent hyperglycemia.  PT/OT for VDRF deconditioning.  03/11/24- patient remains on home MV.  Placed chaplain consult for advance directive today. +purewick.  Mild chronic LE pain.  Working with PT.  CXR today to check on interval changes with atelectasis.      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, HTN, HLD, CHF, CAD s/p STEMI s/ stent 2009, DM type 2 w/ insulin  dependence, Hepatitis C, Former tobacco and cocaine use, Anxiety/Depression, and Obesity    Significant Hospital Events   RECURRENT ADMISSIONS in the PAST   Antimicrobials:  Zosyn    Significant Hospital Events: Including procedures, antibiotic start and stop dates in addition to other pertinent events   10/5 Admit to ICU on home ventilator with acute on chronic respiratory failure iso suspected Pneumonia, AECOPD and UTI  Trache site clean/ #6 Shiley tracheostomy, on home VENT   Objective   Blood pressure  125/72, pulse 71, temperature 99.3 F (37.4 C), temperature source Axillary, resp. rate 16, height 4' 10 (1.473 m), weight 74.7 kg, last menstrual period 03/06/2005, SpO2 100%.    Vent Mode: SIMV;PSV Set Rate:  [16 bmp] 16 bmp Vt Set:  [400 mL] 400 mL PEEP:  [5 cmH20] 5 cmH20 Pressure Support:  [10 cmH20] 10 cmH20   Intake/Output Summary (Last 24 hours) at 03/11/2024 0835 Last data filed at 03/11/2024 0535 Gross per 24 hour  Intake 289.77 ml  Output 475 ml  Net -185.23 ml   Filed Weights   03/09/24 0500 03/10/24 0500 03/11/24 0500  Weight: 75 kg 74.7 kg 74.7 kg       Review of Systems: Gen:  Denies  fever, sweats, chills weight loss  HEENT: Denies blurred vision, double vision, ear pain, eye pain, hearing loss, nose bleeds, sore throat Cardiac:  No dizziness, chest pain or heaviness, chest tightness,edema, No JVD Resp:   No cough, -sputum production, +shortness of breath,-wheezing, -hemoptysis,  Other:  All other systems negative   Physical Examination:   General Appearance: No distress  EYES PERRLA, EOM intact.   NECK Supple, No JVD Pulmonary: normal breath sounds, No wheezing.  CardiovascularNormal S1,S2.  No m/r/g.   Abdomen: Benign, Soft, non-tender. Neurology UE/LE 5/5 strength, no focal deficits Ext pulses intact, cap refill intact ALL OTHER ROS ARE NEGATIVE    Labs/imaging that I havepersonally reviewed  (right click and Reselect all SmartList Selections daily)     ASSESSMENT AND PLAN SYNOPSIS  Acute on Chronic Hypoxic & Hypercapnic Respiratory failure in the  setting of ... Acute COPD Exacerbation  Suspected Pneumonia PMHx: COPD/Emphysema/Asthma with chronic tracheostomy and home vent.  -Vent support per home setting -titrate FiO2, PEEP to maintain O2 sat >90%  -Lung protective ventilation  -PRN Chest X-ray & ABG -PRN and scheduled bronchodilators, hypertonic saline -Start systemic steroid IV ABX   NSTEMI Heparin  infusion Cardiology  consulted -Prn Valium  -Resume outpatient Klonopin  Continue aspririn   ENDO - ICU hypoglycemic\Hyperglycemia protocol -check FSBS per protocol   GI GI PROPHYLAXIS as indicated NUTRITIONAL STATUS DIET-->as tolerated Constipation protocol as indicated   ELECTROLYTES -follow labs as needed -replace as needed -pharmacy consultation and following  RESTRICTIVE TRANSFUSION PROTOCOL TRANSFUSION  IF HGB<7  or ACTIVE BLEEDING OR DX of ACUTE CORONARY SYNDROMES     Best practice (right click and Reselect all SmartList Selections daily)  VAP protocol (if indicated): Yes DVT prophylaxis: Systemic AC GI prophylaxis: H2B Mobility:  bed rest  Code Status:  FULL Disposition:ICU  Labs   CBC: Recent Labs  Lab 03/08/24 1234 03/09/24 0620 03/10/24 0417 03/11/24 0314  WBC 10.0 7.4 7.7 7.6  NEUTROABS 7.8*  --   --   --   HGB 11.7* 10.4* 10.1* 10.7*  HCT 38.6 33.1* 32.7* 35.1*  MCV 83.7 82.8 82.6 84.0  PLT 275 252 257 277    Basic Metabolic Panel: Recent Labs  Lab 03/08/24 1234 03/09/24 0620 03/10/24 0417 03/11/24 0314  NA 140 138 139 139  K 4.1 3.4* 4.2 4.0  CL 103 103 107 107  CO2 25 24 24 24   GLUCOSE 230* 263* 248* 215*  BUN 18 21 14 10   CREATININE 0.80 0.83 0.68 0.83  CALCIUM  9.4 8.9 8.8* 9.1  MG  --  1.7 2.2 2.0  PHOS  --  2.5 2.6 3.6   GFR: Estimated Creatinine Clearance: 60.4 mL/min (by C-G formula based on SCr of 0.83 mg/dL). Recent Labs  Lab 03/08/24 1234 03/08/24 1445 03/09/24 0620 03/10/24 0417 03/11/24 0314  WBC 10.0  --  7.4 7.7 7.6  LATICACIDVEN 1.3 1.2  --   --   --     Liver Function Tests: Recent Labs  Lab 03/08/24 1234 03/10/24 0417 03/11/24 0314  AST 22  --   --   ALT 12  --   --   ALKPHOS 86  --   --   BILITOT 0.6  --   --   PROT 8.2*  --   --   ALBUMIN  3.5 3.3* 3.4*   Recent Labs  Lab 03/08/24 1234  LIPASE 41   No results for input(s): AMMONIA in the last 168 hours.  ABG    Component Value Date/Time   PHART 7.42  03/08/2024 1433   PCO2ART 53 (H) 03/08/2024 1433   PO2ART 64 (L) 03/08/2024 1433   HCO3 34.4 (H) 03/08/2024 1433   ACIDBASEDEF 1.5 08/29/2023 0307   O2SAT 94.6 03/08/2024 1433     Coagulation Profile: Recent Labs  Lab 03/08/24 1433  INR 1.0    Cardiac Enzymes: Recent Labs  Lab 03/08/24 1234  CKTOTAL 180    HbA1C: Hemoglobin A1C  Date/Time Value Ref Range Status  06/29/2013 04:02 AM 6.0 4.2 - 6.3 % Final    Comment:    The American Diabetes Association recommends that a primary goal of therapy should be <7% and that physicians should reevaluate the treatment regimen in patients with HbA1c values consistently >8%.    Hgb A1c MFr Bld  Date/Time Value Ref Range Status  03/10/2024 04:17 AM 8.7 (H) 4.8 -  5.6 % Final    Comment:    (NOTE) Diagnosis of Diabetes The following HbA1c ranges recommended by the American Diabetes Association (ADA) may be used as an aid in the diagnosis of diabetes mellitus.  Hemoglobin             Suggested A1C NGSP%              Diagnosis  <5.7                   Non Diabetic  5.7-6.4                Pre-Diabetic  >6.4                   Diabetic  <7.0                   Glycemic control for                       adults with diabetes.    08/29/2023 01:01 AM 9.4 (H) 4.8 - 5.6 % Final    Comment:    (NOTE) Pre diabetes:          5.7%-6.4%  Diabetes:              >6.4%  Glycemic control for   <7.0% adults with diabetes     CBG: Recent Labs  Lab 03/10/24 1127 03/10/24 1628 03/10/24 1744 03/10/24 2132 03/11/24 0726  GLUCAP 230* 135* 130* 211* 202*     Past Medical History:  She,  has a past medical history of Allergy, Anxiety, Asthma, CHF (congestive heart failure) (HCC), Cocaine abuse (HCC), COPD (chronic obstructive pulmonary disease) (HCC), Coronary artery disease, Emphysema/COPD, Hepatitis C, High cholesterol, Hypertension, Pneumothorax, and Tobacco abuse.   Surgical History:   Past Surgical History:  Procedure  Laterality Date   CARDIAC CATHETERIZATION     CHEST TUBE INSERTION     IR GASTROSTOMY TUBE MOD SED  10/31/2017   TRACHEOSTOMY TUBE PLACEMENT N/A 10/17/2017   Procedure: TRACHEOSTOMY;  Surgeon: Milissa Hamming, MD;  Location: [facility] ORS;  Service: ENT;  Laterality: N/A;     Social History:   reports that she quit smoking about 6 years ago. Her smoking use included cigarettes. She started smoking about 47 years ago. She has a 4.1 pack-year smoking history. She has never used smokeless tobacco. She reports that she does not currently use drugs after having used the following drugs: Crack cocaine. She reports that she does not drink alcohol.   Family History:  Her family history includes Asthma in her mother; Breast cancer (age of onset: 44) in her maternal aunt; CAD in her father; COPD in her sister; CVA in her father; Lung cancer in her mother; Stroke in her father.   Allergies Allergies  Allergen Reactions   Lorazepam      Makes anxiety worse  Other Reaction(s): Makes anxiety worse   Lisinopril  Cough   Amoxicillin      Patient has tolerated cephalosporins in the past    IMAGING     Home Medications  Prior to Admission medications   Medication Sig Start Date End Date Taking? Authorizing Provider  acetaminophen  (TYLENOL ) 325 MG tablet Take 2 tablets (650 mg total) by mouth every 6 (six) hours as needed for mild pain (pain score 1-3) (or Fever >/= 101). 04/24/23   Dorinda Drue DASEN, MD  albuterol  (PROVENTIL ) (2.5 MG/3ML) 0.083% nebulizer solution Take 3 mLs (2.5 mg total) by  nebulization every 6 (six) hours as needed for wheezing or shortness of breath. 03/27/23   Tamea Dedra CROME, MD  albuterol  (VENTOLIN  HFA) 108 (90 Base) MCG/ACT inhaler Inhale 2 puffs into the lungs every 6 (six) hours as needed for wheezing or shortness of breath. 04/02/22   Parrett, Madelin RAMAN, NP  amLODipine  (NORVASC ) 5 MG tablet Take 5 mg by mouth daily. 08/27/21   [provider]  aspirin  81 MG chewable  tablet Chew 81 mg by mouth daily.     [provider]  azelastine  (ASTELIN ) 0.1 % nasal spray SMARTSIG:1-2 Spray(s) Both Nares Every 12 Hours PRN 09/22/20   [provider]  Blood Glucose Monitoring Suppl ([employer] BLOOD GLUCOSE METER) w/Device KIT Use as directed to monitor blood glucose up to qid 02/22/23   Alexander, Natalie, DO  cefdinir (OMNICEF) 300 MG capsule Take 1 capsule (300 mg total) by mouth 2 (two) times daily. 03/02/24   Brimage, Vondra, DO  cetirizine  (ZYRTEC ) 10 MG tablet Take 1 tablet (10 mg total) by mouth daily as needed for allergies. Home med. 12/20/23   Awanda [place], MD  clonazePAM  (KLONOPIN ) 0.5 MG tablet Take 0.5 mg by mouth in the morning, at noon, and at bedtime.    [provider]  cloNIDine  (CATAPRES  - DOSED IN MG/24 HR) 0.1 mg/24hr patch Place 0.1 mg onto the skin once a week.    [provider]  docusate sodium  (COLACE) 100 MG capsule Take 2 capsules (200 mg total) by mouth daily as needed for mild constipation. 04/24/23   Dorinda Drue DASEN, MD  famotidine  (PEPCID ) 20 MG tablet Take 1 tablet by mouth daily as needed for heartburn 09/24/23   [provider]  fluconazole  (DIFLUCAN ) 150 MG tablet Take 1 tablet (150 mg total) by mouth once a week. 03/02/24   Brimage, Vondra, DO  fluticasone  (FLONASE ) 50 MCG/ACT nasal spray Place 1 spray into both nostrils daily as needed for allergies or rhinitis. Home med. 12/20/23   Awanda [place], MD  guaiFENesin  (MUCINEX ) 600 MG 12 hr tablet Take 600 mg by mouth 2 (two) times daily.    [provider]  hydrOXYzine  (VISTARIL ) 25 MG capsule Take 25 mg by mouth every 6 (six) hours as needed. 10/01/22   [provider]  ipratropium-albuterol  (DUONEB) 0.5-2.5 (3) MG/3ML SOLN Take 3 mLs by nebulization every 6 (six) hours as needed (for shortness of breath). 06/17/23   Kasa, Kurian, MD  LANTUS  SOLOSTAR 100 UNIT/ML Solostar Pen Inject 40 Units into the skin at bedtime. 01/03/23   [provider]   mirtazapine  (REMERON ) 30 MG tablet Take 1 tablet by mouth every night at bedtime 10/17/23   [provider]  Multiple Vitamin (MULTIVITAMIN WITH MINERALS) TABS tablet Take 1 tablet by mouth daily. 12/20/23   Awanda [place], MD  naloxone  (NARCAN ) nasal spray 4 mg/0.1 mL Place 1 spray into the nose once as needed for up to 1 dose. Home med. 12/20/23   Awanda [place], MD  NOVOLOG  FLEXPEN 100 UNIT/ML FlexPen Inject 5-10 Units into the skin 3 (three) times daily with meals. Sliding scale mealtime insulin . 03/01/21   [provider]  nystatin  ointment (MYCOSTATIN ) Apply 1 Application topically 3 (three) times daily as needed. Home med. 12/20/23   Awanda [place], MD  oxyCODONE -acetaminophen  (PERCOCET/ROXICET) 5-325 MG tablet Take 1 tablet by mouth 3 (three) times daily as needed for pain. 09/07/21   [provider]  PARoxetine  (PAXIL ) 30 MG tablet Take 30 mg by mouth daily. 09/19/23  [provider]  polyethylene glycol powder (GLYCOLAX /MIRALAX ) 17 GM/SCOOP powder Take 17 g by mouth daily as needed. Home med. 12/20/23   Awanda [place], MD  PULMICORT  0.5 MG/2ML nebulizer solution Take 0.5 mg by nebulization every 12 (twelve) hours. 02/13/21   [provider]  terbinafine (LAMISIL) 1 % cream Apply 1 application  topically 2 (two) times daily. To affected skin, for 1 week after resolution of rash. # 30 gm, 1 refill.    [provider]  TRULICITY 4.5 MG/0.5ML SOPN Inject 4.5 mg into the skin once a week. 09/28/21   [provider]  Vitamin D , Ergocalciferol , (DRISDOL ) 1.25 MG (50000 UNIT) CAPS capsule Take 50,000 Units by mouth every 7 (seven) days.    [provider]  fluconazole  (DIFLUCAN ) 150 MG tablet Take 1 tablet day 1 repeat in 1 week 11/15/20   Moye, [place] , MD      Critical care provider statement:   Total critical care time: 33 minutes   Performed by: Parris MD   Critical care time was exclusive of separately billable procedures and treating other  patients.   Critical care was necessary to treat or prevent imminent or life-threatening deterioration.   Critical care was time spent personally by me on the following activities: development of treatment plan with patient and/or surrogate as well as nursing, discussions with consultants, evaluation of patient's response to treatment, examination of patient, obtaining history from patient or surrogate, ordering and performing treatments and interventions, ordering and review of laboratory studies, ordering and review of radiographic studies, pulse oximetry and re-evaluation of patient's condition.    Tranae Laramie, M.D.  Pulmonary & Critical Care Medicine

## 2024-03-12 LAB — MAGNESIUM: Magnesium: 2 mg/dL (ref 1.7–2.4)

## 2024-03-12 LAB — GLUCOSE, CAPILLARY
Glucose-Capillary: 177 mg/dL — ABNORMAL HIGH (ref 70–99)
Glucose-Capillary: 207 mg/dL — ABNORMAL HIGH (ref 70–99)
Glucose-Capillary: 193 mg/dL — ABNORMAL HIGH (ref 70–99)

## 2024-03-12 LAB — BASIC METABOLIC PANEL WITH GFR
Anion gap: 12 (ref 5–15)
BUN: 13 mg/dL (ref 8–23)
CO2: 22 mmol/L (ref 22–32)
Calcium: 8.9 mg/dL (ref 8.9–10.3)
Chloride: 105 mmol/L (ref 98–111)
Creatinine, Ser: 0.86 mg/dL (ref 0.44–1.00)
GFR, Estimated: >60 mL/min (ref >60)
Glucose, Bld: 190 mg/dL — ABNORMAL HIGH (ref 70–99)
Potassium: 3.9 mmol/L (ref 3.5–5.1)
Sodium: 139 mmol/L (ref 135–145)

## 2024-03-12 LAB — PHOSPHORUS: Phosphorus: 4 mg/dL (ref 2.5–4.6)

## 2024-03-12 LAB — BLOOD GAS, ARTERIAL
Acid-Base Excess: 3.4 mmol/L — ABNORMAL HIGH (ref 0.0–2.0)
Bicarbonate: 29.6 mmol/L — ABNORMAL HIGH (ref 20.0–28.0)
MECHVT: 400 mL
Mechanical Rate: 16
O2 Content: 4 L/min
O2 Saturation: 98 %
PEEP: 5 cmH2O
Patient temperature: 37
pCO2 arterial: 50 mmHg — ABNORMAL HIGH (ref 32–48)
pH, Arterial: 7.38 (ref 7.35–7.45)
pO2, Arterial: 75 mmHg — ABNORMAL LOW (ref 83–108)

## 2024-03-12 LAB — CBC
HCT: 36.2 % (ref 36.0–46.0)
Hemoglobin: 11.3 g/dL — ABNORMAL LOW (ref 12.0–15.0)
MCH: 25.7 pg — ABNORMAL LOW (ref 26.0–34.0)
MCHC: 31.2 g/dL (ref 30.0–36.0)
MCV: 82.5 fL (ref 80.0–100.0)
Platelets: 318 K/uL (ref 150–400)
RBC: 4.39 MIL/uL (ref 3.87–5.11)
RDW: 14.2 % (ref 11.5–15.5)
WBC: 7.5 K/uL (ref 4.0–10.5)
nRBC: 0 % (ref 0.0–0.2)

## 2024-03-12 MED ORDER — AZITHROMYCIN 250 MG PO TABS: ORAL_TABLET | ORAL | 0 refills | Status: DC

## 2024-03-12 MED ORDER — INSULIN GLARGINE 100 UNIT/ML ~~LOC~~ SOLN
23.0000 [IU] | Freq: Two times a day (BID) | SUBCUTANEOUS | Status: DC
  Filled 2024-03-12: qty 0.23

## 2024-03-12 NOTE — Plan of Care (Signed)
  Problem: Education: Goal: Knowledge of General Education information will improve Description: Including pain rating scale, medication(s)/side effects and non-pharmacologic comfort measures Outcome: Adequate for Discharge   Problem: Clinical Measurements: Goal: Respiratory complications will improve Outcome: Adequate for Discharge Goal: Cardiovascular complication will be avoided Outcome: Adequate for Discharge   Problem: Nutrition: Goal: Adequate nutrition will be maintained Outcome: Adequate for Discharge   Problem: Elimination: Goal: Will not experience complications related to bowel motility Outcome: Adequate for Discharge Goal: Will not experience complications related to urinary retention Outcome: Adequate for Discharge   Problem: Pain Managment: Goal: General experience of comfort will improve and/or be controlled Outcome: Adequate for Discharge   Problem: Safety: Goal: Ability to remain free from injury will improve Outcome: Adequate for Discharge   Problem: Skin Integrity: Goal: Risk for impaired skin integrity will decrease Outcome: Adequate for Discharge   Problem: Coping: Goal: Ability to adjust to condition or change in health will improve Outcome: Adequate for Discharge   Problem: Fluid Volume: Goal: Ability to maintain a balanced intake and output will improve Outcome: Adequate for Discharge   Problem: Health Behavior/Discharge Planning: Goal: Ability to identify and utilize available resources and services will improve Outcome: Adequate for Discharge Goal: Ability to manage health-related needs will improve Outcome: Adequate for Discharge   Problem: Metabolic: Goal: Ability to maintain appropriate glucose levels will improve Outcome: Adequate for Discharge   Problem: Nutritional: Goal: Maintenance of adequate nutrition will improve Outcome: Adequate for Discharge   Problem: Tissue Perfusion: Goal: Adequacy of tissue perfusion will  improve Outcome: Adequate for Discharge

## 2024-03-12 NOTE — Inpatient Diabetes Management (Signed)
 Inpatient Diabetes Program Recommendations  AACE/ADA: New Consensus Statement on Inpatient Glycemic Control  Target Ranges:  Prepandial:   less than 140 mg/dL      Peak postprandial:   less than 180 mg/dL (1-2 hours)      Critically ill patients:  140 - 180 mg/dL    Latest Reference Range & Units 03/11/24 07:26 03/11/24 10:19 03/11/24 11:34 03/11/24 16:30 03/11/24 22:09 03/12/24 08:40  Glucose-Capillary 70 - 99 mg/dL 797 (H)  Novolog  7 units      Lantus  20 units 204 (H)  Novolog  7 units 147 (H)  Novolog  5 units 207 (H)  Novolog  2 units  Lantus  20 units 177 (H)   Review of Glycemic Control  Diabetes history: DM2 Outpatient Diabetes medications: Lantus  40 units daily, Novolog  5-10 units TID with meals, Trulicity 4.5 mg Qweek Current orders for Inpatient glycemic control: Lantus  20 units BID, Novolog  0-9 units TID with meals, Novolog  0-5 units at bedtime, Novolog  4 units TID with meals   Inpatient Diabetes Program Recommendations:     Insulin : Please consider increasing Lantus  to 22 units BID.   Thanks, Earnie Gainer, RN, MSN, CDCES Diabetes Coordinator Inpatient Diabetes Program [phone] (Team Pager from 8am to 5pm)

## 2024-03-12 NOTE — Progress Notes (Signed)
 Patient given discharge teaching and discharge instructions. Still awaiting transportation home. Handoff given to oncoming RN.

## 2024-03-12 NOTE — Progress Notes (Signed)
 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  She appears to have developled interval pleural effusion. She is on home ventilator.  She is having her meds refined adding paroxetine  and her home insulin .    03/10/24- patient remains on MV, she no longer requires heparin  gtt, we will start SCD and lovenox  [place] for dvt ppx.  Her diabetic therapy is being refined today due to persistent hyperglycemia.  PT/OT for VDRF deconditioning.  03/11/24- patient remains on home MV.  Placed chaplain consult for advance directive today. +purewick.  Mild chronic LE pain.  Working with PT.  CXR today to check on interval changes with atelectasis.  Transient hypotension today we have dcd norvasc .    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  ABG today to rule out hypercapnia.    Objective   Blood pressure (!) 86/62, pulse 71, temperature 97.9 F  (36.6 C), temperature source Oral, resp. rate 19, height 4' 10 (1.473 m), weight 74.7 kg, last menstrual period 03/06/2005, SpO2 96%.    Vent Mode: SIMV Set Rate:  [16 bmp] 16 bmp Vt Set:  [400 mL] 400 mL PEEP:  [5 cmH20] 5 cmH20 Pressure Support:  [10 cmH20] 10 cmH20   Intake/Output Summary (Last 24 hours) at 03/12/2024 1010 Last data filed at 03/12/2024 0500 Gross per 24 hour  Intake --  Output 700 ml  Net -700 ml   Filed Weights   03/09/24 0500 03/10/24 0500 03/11/24 0500  Weight: 75 kg 74.7 kg 74.7 kg       Review of Systems: Gen:  Denies  fever, sweats, chills weight loss  HEENT: Denies blurred vision, double vision, ear pain, eye pain, hearing loss, nose bleeds, sore throat Cardiac:  No dizziness, chest pain or heaviness, chest tightness,edema, No JVD Resp:   No cough, -sputum production, +shortness of breath,-wheezing, -hemoptysis,  Other:  All other systems negative   Physical Examination:   General Appearance: No distress  EYES PERRLA, EOM intact.   NECK Supple, No JVD Pulmonary: normal breath sounds, No wheezing.  CardiovascularNormal S1,S2.  No m/r/g.   Abdomen: Benign, Soft, non-tender. Neurology UE/LE 5/5 strength, no focal deficits Ext pulses intact, cap refill intact ALL OTHER ROS ARE NEGATIVE    Labs/imaging that I havepersonally reviewed  (right click and Reselect all SmartList Selections daily)     ASSESSMENT AND PLAN SYNOPSIS  Acute on Chronic Hypoxic & Hypercapnic Respiratory failure in the setting of ... Acute COPD Exacerbation  Suspected Pneumonia PMHx: COPD/Emphysema/Asthma with chronic tracheostomy and home vent.  -Vent support per home setting -titrate FiO2, PEEP to maintain O2 sat >90%  -Lung protective ventilation  -PRN Chest X-ray & ABG -PRN and scheduled bronchodilators, hypertonic saline -Start systemic steroid IV ABX   NSTEMI Heparin  infusion Cardiology consulted -Prn Valium  -Resume outpatient Klonopin  Continue  aspririn   ENDO - ICU hypoglycemic\Hyperglycemia protocol -check FSBS per protocol   GI GI PROPHYLAXIS as indicated NUTRITIONAL STATUS DIET-->as tolerated Constipation protocol as indicated   ELECTROLYTES -follow labs as needed -replace as needed -pharmacy consultation and following  RESTRICTIVE TRANSFUSION PROTOCOL TRANSFUSION  IF HGB<7  or ACTIVE BLEEDING OR DX of ACUTE CORONARY SYNDROMES     Best practice (right click and Reselect all SmartList Selections daily)  VAP protocol (if indicated): Yes DVT prophylaxis: Systemic AC GI prophylaxis: H2B Mobility:  bed rest  Code Status:  FULL Disposition:ICU  Labs   CBC: Recent Labs  Lab 03/08/24 1234 03/09/24 0620 03/10/24 0417 03/11/24 0314 03/12/24 0315  WBC 10.0 7.4 7.7 7.6 7.5  NEUTROABS 7.8*  --   --   --   --   HGB 11.7* 10.4* 10.1* 10.7* 11.3*  HCT 38.6 33.1* 32.7* 35.1* 36.2  MCV 83.7 82.8 82.6 84.0 82.5  PLT 275 252 257 277 318    Basic Metabolic Panel: Recent Labs  Lab 03/08/24 1234 03/09/24 0620 03/10/24 0417 03/11/24 0314 03/12/24 0315  NA 140 138 139 139 139  K 4.1 3.4* 4.2 4.0 3.9  CL 103 103 107 107 105  CO2 25 24 24 24 22   GLUCOSE 230* 263* 248* 215* 190*  BUN 18 21 14 10 13   CREATININE 0.80 0.83 0.68 0.83 0.86  CALCIUM  9.4 8.9 8.8* 9.1 8.9  MG  --  1.7 2.2 2.0 2.0  PHOS  --  2.5 2.6 3.6 4.0   GFR: Estimated Creatinine Clearance: 58.2 mL/min (by C-G formula based on SCr of 0.86 mg/dL). Recent Labs  Lab 03/08/24 1234 03/08/24 1445 03/09/24 0620 03/10/24 0417 03/11/24 0314 03/12/24 0315  WBC 10.0  --  7.4 7.7 7.6 7.5  LATICACIDVEN 1.3 1.2  --   --   --   --     Liver Function Tests: Recent Labs  Lab 03/08/24 1234 03/10/24 0417 03/11/24 0314  AST 22  --   --   ALT 12  --   --   ALKPHOS 86  --   --   BILITOT 0.6  --   --   PROT 8.2*  --   --   ALBUMIN  3.5 3.3* 3.4*   Recent Labs  Lab 03/08/24 1234  LIPASE 41   No results for input(s): AMMONIA in the last  168 hours.  ABG    Component Value Date/Time   PHART 7.42 03/08/2024 1433   PCO2ART 53 (H) 03/08/2024 1433   PO2ART 64 (L) 03/08/2024 1433   HCO3 34.4 (H) 03/08/2024 1433   ACIDBASEDEF 1.5 08/29/2023 0307   O2SAT 94.6 03/08/2024 1433     Coagulation Profile: Recent Labs  Lab 03/08/24 1433  INR 1.0    Cardiac Enzymes: Recent Labs  Lab 03/08/24 1234  CKTOTAL 180    HbA1C: Hemoglobin A1C  Date/Time Value Ref Range Status  06/29/2013 04:02 AM 6.0 4.2 - 6.3 % Final    Comment:    The American Diabetes Association recommends that a primary goal of therapy should be <7% and that physicians should reevaluate the treatment regimen in patients with HbA1c values consistently >  8%.    Hgb A1c MFr Bld  Date/Time Value Ref Range Status  03/10/2024 04:17 AM 8.7 (H) 4.8 - 5.6 % Final    Comment:    (NOTE) Diagnosis of Diabetes The following HbA1c ranges recommended by the American Diabetes Association (ADA) may be used as an aid in the diagnosis of diabetes mellitus.  Hemoglobin             Suggested A1C NGSP%              Diagnosis  <5.7                   Non Diabetic  5.7-6.4                Pre-Diabetic  >6.4                   Diabetic  <7.0                   Glycemic control for                       adults with diabetes.    08/29/2023 01:01 AM 9.4 (H) 4.8 - 5.6 % Final    Comment:    (NOTE) Pre diabetes:          5.7%-6.4%  Diabetes:              >6.4%  Glycemic control for   <7.0% adults with diabetes     CBG: Recent Labs  Lab 03/11/24 0726 03/11/24 1134 03/11/24 1630 03/11/24 2209 03/12/24 0840  GLUCAP 202* 204* 147* 207* 177*     Past Medical History:  She,  has a past medical history of Allergy, Anxiety, Asthma, CHF (congestive heart failure) (HCC), Cocaine abuse (HCC), COPD (chronic obstructive pulmonary disease) (HCC), Coronary artery disease, Emphysema/COPD, Hepatitis C, High cholesterol, Hypertension, Pneumothorax, and Tobacco abuse.    Surgical History:   Past Surgical History:  Procedure Laterality Date   CARDIAC CATHETERIZATION     CHEST TUBE INSERTION     IR GASTROSTOMY TUBE MOD SED  10/31/2017   TRACHEOSTOMY TUBE PLACEMENT N/A 10/17/2017   Procedure: TRACHEOSTOMY;  Surgeon: Milissa Hamming, MD;  Location: [facility] ORS;  Service: ENT;  Laterality: N/A;     Social History:   reports that she quit smoking about 6 years ago. Her smoking use included cigarettes. She started smoking about 47 years ago. She has a 4.1 pack-year smoking history. She has never used smokeless tobacco. She reports that she does not currently use drugs after having used the following drugs: Crack cocaine. She reports that she does not drink alcohol.   Family History:  Her family history includes Asthma in her mother; Breast cancer (age of onset: 71) in her maternal aunt; CAD in her father; COPD in her sister; CVA in her father; Lung cancer in her mother; Stroke in her father.   Allergies Allergies  Allergen Reactions   Lorazepam      Makes anxiety worse  Other Reaction(s): Makes anxiety worse   Lisinopril  Cough   Amoxicillin      Patient has tolerated cephalosporins in the past    IMAGING     Home Medications  Prior to Admission medications   Medication Sig Start Date End Date Taking? Authorizing Provider  acetaminophen  (TYLENOL ) 325 MG tablet Take 2 tablets (650 mg total) by mouth every 6 (six) hours as needed for mild pain (pain score 1-3) (or Fever >/= 101).  04/24/23   Dorinda Drue DASEN, MD  albuterol  (PROVENTIL ) (2.5 MG/3ML) 0.083% nebulizer solution Take 3 mLs (2.5 mg total) by nebulization every 6 (six) hours as needed for wheezing or shortness of breath. 03/27/23   Tamea Dedra CROME, MD  albuterol  (VENTOLIN  HFA) 108 (90 Base) MCG/ACT inhaler Inhale 2 puffs into the lungs every 6 (six) hours as needed for wheezing or shortness of breath. 04/02/22   Parrett, Madelin RAMAN, NP  amLODipine  (NORVASC ) 5 MG tablet Take 5 mg by mouth daily.  08/27/21   [provider]  aspirin  81 MG chewable tablet Chew 81 mg by mouth daily.     [provider]  azelastine  (ASTELIN ) 0.1 % nasal spray SMARTSIG:1-2 Spray(s) Both Nares Every 12 Hours PRN 09/22/20   [provider]  Blood Glucose Monitoring Suppl ([employer] BLOOD GLUCOSE METER) w/Device KIT Use as directed to monitor blood glucose up to qid 02/22/23   Alexander, Natalie, DO  cefdinir (OMNICEF) 300 MG capsule Take 1 capsule (300 mg total) by mouth 2 (two) times daily. 03/02/24   Brimage, Vondra, DO  cetirizine  (ZYRTEC ) 10 MG tablet Take 1 tablet (10 mg total) by mouth daily as needed for allergies. Home med. 12/20/23   Awanda City, MD  clonazePAM  (KLONOPIN ) 0.5 MG tablet Take 0.5 mg by mouth in the morning, at noon, and at bedtime.    [provider]  cloNIDine  (CATAPRES  - DOSED IN MG/24 HR) 0.1 mg/24hr patch Place 0.1 mg onto the skin once a week.    [provider]  docusate sodium  (COLACE) 100 MG capsule Take 2 capsules (200 mg total) by mouth daily as needed for mild constipation. 04/24/23   Dorinda Drue DASEN, MD  famotidine  (PEPCID ) 20 MG tablet Take 1 tablet by mouth daily as needed for heartburn 09/24/23   [provider]  fluconazole  (DIFLUCAN ) 150 MG tablet Take 1 tablet (150 mg total) by mouth once a week. 03/02/24   Brimage, Vondra, DO  fluticasone  (FLONASE ) 50 MCG/ACT nasal spray Place 1 spray into both nostrils daily as needed for allergies or rhinitis. Home med. 12/20/23   Awanda City, MD  guaiFENesin  (MUCINEX ) 600 MG 12 hr tablet Take 600 mg by mouth 2 (two) times daily.    [provider]  hydrOXYzine  (VISTARIL ) 25 MG capsule Take 25 mg by mouth every 6 (six) hours as needed. 10/01/22   [provider]  ipratropium-albuterol  (DUONEB) 0.5-2.5 (3) MG/3ML SOLN Take 3 mLs by nebulization every 6 (six) hours as needed (for shortness of breath). 06/17/23   Kasa, Kurian, MD  LANTUS  SOLOSTAR 100 UNIT/ML Solostar Pen Inject 40 Units into  the skin at bedtime. 01/03/23   [provider]  mirtazapine  (REMERON ) 30 MG tablet Take 1 tablet by mouth every night at bedtime 10/17/23   [provider]  Multiple Vitamin (MULTIVITAMIN WITH MINERALS) TABS tablet Take 1 tablet by mouth daily. 12/20/23   Awanda City, MD  naloxone  (NARCAN ) nasal spray 4 mg/0.1 mL Place 1 spray into the nose once as needed for up to 1 dose. Home med. 12/20/23   Awanda City, MD  NOVOLOG  FLEXPEN 100 UNIT/ML FlexPen Inject 5-10 Units into the skin 3 (three) times daily with meals. Sliding scale mealtime insulin . 03/01/21   [provider]  nystatin  ointment (MYCOSTATIN ) Apply 1 Application topically 3 (three) times daily as needed. Home med. 12/20/23   Awanda City, MD  oxyCODONE -acetaminophen  (PERCOCET/ROXICET) 5-325 MG tablet Take 1 tablet by mouth 3 (three) times daily as needed for  pain. 09/07/21   [provider]  PARoxetine  (PAXIL ) 30 MG tablet Take 30 mg by mouth daily. 09/19/23   [provider]  polyethylene glycol powder (GLYCOLAX /MIRALAX ) 17 GM/SCOOP powder Take 17 g by mouth daily as needed. Home med. 12/20/23   Awanda City, MD  PULMICORT  0.5 MG/2ML nebulizer solution Take 0.5 mg by nebulization every 12 (twelve) hours. 02/13/21   [provider]  terbinafine (LAMISIL) 1 % cream Apply 1 application  topically 2 (two) times daily. To affected skin, for 1 week after resolution of rash. # 30 gm, 1 refill.    [provider]  TRULICITY 4.5 MG/0.5ML SOPN Inject 4.5 mg into the skin once a week. 09/28/21   [provider]  Vitamin D , Ergocalciferol , (DRISDOL ) 1.25 MG (50000 UNIT) CAPS capsule Take 50,000 Units by mouth every 7 (seven) days.    [provider]  fluconazole  (DIFLUCAN ) 150 MG tablet Take 1 tablet day 1 repeat in 1 week 11/15/20   Moye, [place] , MD      Critical care provider statement:   Total critical care time: 33 minutes   Performed by: Parris MD   Critical care time was  exclusive of separately billable procedures and treating other patients.   Critical care was necessary to treat or prevent imminent or life-threatening deterioration.   Critical care was time spent personally by me on the following activities: development of treatment plan with patient and/or surrogate as well as nursing, discussions with consultants, evaluation of patient's response to treatment, examination of patient, obtaining history from patient or surrogate, ordering and performing treatments and interventions, ordering and review of laboratory studies, ordering and review of radiographic studies, pulse oximetry and re-evaluation of patient's condition.    Rhyder Bratz, M.D.  Pulmonary & Critical Care Medicine

## 2024-03-12 NOTE — Plan of Care (Signed)
  Problem: Education: Goal: Knowledge of General Education information will improve Description: Including pain rating scale, medication(s)/side effects and non-pharmacologic comfort measures Outcome: Progressing   Problem: Health Behavior/Discharge Planning: Goal: Ability to manage health-related needs will improve Outcome: Progressing   Problem: Clinical Measurements: Goal: Ability to maintain clinical measurements within normal limits will improve Outcome: Progressing Goal: Diagnostic test results will improve Outcome: Progressing Goal: Respiratory complications will improve Outcome: Progressing Goal: Cardiovascular complication will be avoided Outcome: Progressing   Problem: Activity: Goal: Risk for activity intolerance will decrease Outcome: Progressing   Problem: Elimination: Goal: Will not experience complications related to urinary retention Outcome: Progressing

## 2024-03-12 NOTE — TOC Transition Note (Addendum)
 Transition of Care [facility]) - Discharge Note   Patient Details  Name: Michelle [name] MRN: [id number] Date of Birth: [birth year]  [facility] CM/SW Contact:  Lauraine JAYSON Carpen, LCSW Phone Number: 03/12/2024, 2:21 PM   Clinical Narrative:   Patient has orders to discharge home today. She confirmed she is active with [facility] for private duty nursing. CSW notified them that she is discharging today and faxed her discharge summary to them. Patient will require ambulance transport home. Daughter Wells will ride with her. CSW called [facility] with [employer]. They will arrange and estimate that it will take about 3 hours. They will text updates to daughter. Address on facesheet is correct. No further concerns. CSW signing off.  3:08 pm: Received call from [place] with [facility]. There are 6-7 people on the wait list for [facility] so they'll be here by 7:00 at the latest. Daughter is aware.  Final next level of care: Home/Self Care (with private duty nursing.) Barriers to Discharge: No Barriers Identified   Patient Goals and CMS Choice            Discharge Placement                Patient to be transferred to facility by: Ambulance Transport Name of family member notified: [name] Patient and family notified of of transfer: 03/12/24  Discharge Plan and Services Additional resources added to the After Visit Summary for                                       Social Drivers of Health (SDOH) Interventions SDOH Screenings   Food Insecurity: No Food Insecurity (03/08/2024)  Housing: Unknown (03/08/2024)  Transportation Needs: No Transportation Needs (03/08/2024)  Utilities: Not At Risk (03/08/2024)  Financial Resource Strain: Medium Risk (06/29/2017)  Physical Activity: Inactive (06/29/2017)  Social Connections: Unknown (06/29/2017)  Stress: Stress Concern Present (06/29/2017)  Tobacco Use: Medium Risk (03/08/2024)     Readmission Risk  Interventions    03/12/2024    2:20 PM 12/16/2023    1:28 PM 04/19/2023    2:10 PM  Readmission Risk Prevention Plan  Transportation Screening  Complete Complete  PCP or Specialist Appt within 3-5 Days   Complete  HRI or Home Care Consult   Complete  Social Work Consult for Recovery Care Planning/Counseling   Complete  Palliative Care Screening   Complete  Medication Review [employer])  Complete Complete  PCP or Specialist appointment within 3-5 days of discharge Complete Complete   HRI or Home Care Consult Complete Complete   SW Recovery Care/Counseling Consult Complete Complete   Palliative Care Screening Not Applicable Not Applicable   Skilled Nursing Facility Not Applicable Not Applicable

## 2024-03-12 NOTE — Discharge Summary (Signed)
 Physician Discharge Summary  Patient ID: Michelle [name] MRN: [id number] DOB/AGE: 1960/09/15 63 y.o.  Admit date: 03/08/2024 Discharge date: 03/12/2024   Brief Pt Description / Synopsis:  63 y.o. 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 Found to have elevated Troponin and was started on heparin .  PCCM asked to admit for further workup and treatment.    Please see Significant Hospital Events section below for full detailed hospital course.    Significant Events:  03/09/24- patient seen at bedside.  She appears to have developled interval pleural effusion. She is on home ventilator.  She is having her meds refined adding paroxetine  and her home insulin .   03/10/24- patient remains on MV, she no longer requires heparin  gtt, we will start SCD and lovenox  [place] for dvt ppx.  Her diabetic therapy is being refined today due to persistent hyperglycemia.  PT/OT for VDRF deconditioning. 03/11/24- patient remains on home MV.  Placed chaplain consult for  advance directive today. +purewick.  Mild chronic LE pain.  Working with PT.  CXR today to check on interval changes with atelectasis.  Transient hypotension today we have dcd norvasc . 03/12/24: No significant events.  Discharge pt home with Zpack.  Discharge Plan by Diagnosis:   #Acute on Chronic Hypoxic Respiratory Failure #Acute COPD Exacerbation #Suspected Pneumonia -Continue home vent support -Completed course of Zosyn , started on Azithromycin  ~ discharge home on Zpack -Bronchodilators  #Hypertension #Hyperlipidemia #CAD -Continue home Amlodipine  and Clonidine  patch -Continue Aspirin    #Diabetes Mellitus Type II -Resume home Lantus  and Novolog       Significant Diagnostic Studies:  10/5: Chest X-ray>>IMPRESSION: Patchy reticular nodularity along the RIGHT lower lateral lung base. Differential considerations include atelectasis, aspiration or infection.Given underlying emphysema, recommend evaluation for candidacy for annual lung cancer screening CT. 10/5: Venous Ultrasound Bilateral Lower Extremities>>IMPRESSION: No evidence of deep venous thrombosis in either lower extremity.            Micro Data:  10/5: COVID/FLU/RSV PCR>> negative 10/5: Blood cultures x2>> no growth to date 10/5: Urine>> <10,000 colonies/insignificant growth  10/5: MRSA PCR negative  Antimicrobials:   Anti-infectives (From admission, onward)    Start     Dose/Rate Route Frequency Ordered Stop   03/12/24 0000  azithromycin  (ZITHROMAX  Z-PAK) 250 MG tablet           03/12/24 1308     03/10/24 1800  azithromycin  (ZITHROMAX ) tablet 500  mg        500 mg Oral Daily 03/10/24 1137 03/13/24 1759   03/08/24 1800  piperacillin -tazobactam (ZOSYN ) IVPB 3.375 g  Status:  Discontinued        3.375 g 12.5 mL/hr over 240 Minutes Intravenous Every 8 hours 03/08/24 1710 03/10/24 1137          Consults:  PCCM   Discharge Exam:   General: Chronically ill appearing obese female, sitting in bed, on  home ventilator via trach, in NAD Neuro: Awake and alert, oriented x4, moves all extremities purposefully, no focal deficits, PERRLA CV: Regular rate and rhythm (NSR on telemetry), s1s2, no M/R/G PULM: Mechanical breath sounds throughout, even, nonlabored, normal effort GI: Obese, soft, nontender, nondistended, no guarding or rebound tenderness, BS+ x4 Extremities: Normal bulk and tone, no deformities, no cyanosis, good peripheral perfusion   Vitals:   03/12/24 1048 03/12/24 1100 03/12/24 1101 03/12/24 1200  BP:  (!) 154/89    Pulse:  (!) 103    Resp:  (!) 30    Temp:    98.3 F (36.8 C)  TempSrc:    Oral  SpO2: 95% 98% 95%   Weight:      Height:         Discharge Labs:   BMET Recent Labs  Lab 03/08/24 1234 03/09/24 0620 03/10/24 0417 03/11/24 0314 03/12/24 0315  NA 140 138 139 139 139  K 4.1 3.4* 4.2 4.0 3.9  CL 103 103 107 107 105  CO2 25 24 24 24 22   GLUCOSE 230* 263* 248* 215* 190*  BUN 18 21 14 10 13   CREATININE 0.80 0.83 0.68 0.83 0.86  CALCIUM  9.4 8.9 8.8* 9.1 8.9  MG  --  1.7 2.2 2.0 2.0  PHOS  --  2.5 2.6 3.6 4.0    CBC Recent Labs  Lab 03/10/24 0417 03/11/24 0314 03/12/24 0315  HGB 10.1* 10.7* 11.3*  HCT 32.7* 35.1* 36.2  WBC 7.7 7.6 7.5  PLT 257 277 318    Anti-Coagulation Recent Labs  Lab 03/08/24 1433  INR 1.0          Allergies as of 03/12/2024       Reactions   Lorazepam     Makes anxiety worse Other Reaction(s): Makes anxiety worse   Lisinopril  Cough   Amoxicillin     Patient has tolerated cephalosporins in the past        Medication List     STOP taking these medications    cefdinir 300 MG capsule Commonly known as: OMNICEF       TAKE these medications    acetaminophen  325 MG tablet Commonly known as: TYLENOL  Take 2 tablets (650 mg total) by mouth every 6 (six) hours as needed for mild pain (pain score 1-3) (or Fever >/= 101).   albuterol  108 (90 Base) MCG/ACT inhaler Commonly known as: VENTOLIN   HFA Inhale 2 puffs into the lungs every 6 (six) hours as needed for wheezing or shortness of breath.   albuterol  (2.5 MG/3ML) 0.083% nebulizer solution Commonly known as: PROVENTIL  Take 3 mLs (2.5 mg total) by nebulization every 6 (six) hours as needed for wheezing or shortness of breath.   amLODipine  5 MG tablet Commonly known as: NORVASC  Take 5 mg by mouth daily.   aspirin  81 MG chewable tablet Chew 81 mg by mouth daily.   azelastine  0.1 % nasal spray Commonly known as: ASTELIN  SMARTSIG:1-2 Spray(s) Both Nares Every 12 Hours PRN   azithromycin  250 MG tablet Commonly known as:  Zithromax  Z-Pak Take 500 mg by mouth daily for one day followed by 250 mg daily by mouth for four days   cetirizine  10 MG tablet Commonly known as: ZYRTEC  Take 1 tablet (10 mg total) by mouth daily as needed for allergies. Home med.   clonazePAM  0.5 MG tablet Commonly known as: KLONOPIN  Take 0.5 mg by mouth in the morning, at noon, and at bedtime.   cloNIDine  0.1 mg/24hr patch Commonly known as: CATAPRES  - Dosed in mg/24 hr Place 0.1 mg onto the skin once a week.   [employer] Blood Glucose Meter w/Device Kit Use as directed to monitor blood glucose up to qid   docusate sodium  100 MG capsule Commonly known as: COLACE Take 2 capsules (200 mg total) by mouth daily as needed for mild constipation.   famotidine  20 MG tablet Commonly known as: PEPCID  Take 1 tablet by mouth daily as needed for heartburn   fluconazole  150 MG tablet Commonly known as: Diflucan  Take 1 tablet (150 mg total) by mouth once a week.   fluticasone  50 MCG/ACT nasal spray Commonly known as: FLONASE  Place 1 spray into both nostrils daily as needed for allergies or rhinitis. Home med.   guaiFENesin  600 MG 12 hr tablet Commonly known as: MUCINEX  Take 600 mg by mouth 2 (two) times daily.   hydrOXYzine  25 MG capsule Commonly known as: VISTARIL  Take 25 mg by mouth every 6 (six) hours as needed.   ipratropium-albuterol  0.5-2.5 (3)  MG/3ML Soln Commonly known as: DUONEB Take 3 mLs by nebulization every 6 (six) hours as needed (for shortness of breath).   Lantus  SoloStar 100 UNIT/ML Solostar Pen Generic drug: insulin  glargine Inject 40 Units into the skin at bedtime.   mirtazapine  45 MG tablet Commonly known as: REMERON  Take 45 mg by mouth at bedtime.   multivitamin with minerals Tabs tablet Take 1 tablet by mouth daily.   naloxone  4 MG/0.1ML Liqd nasal spray kit Commonly known as: NARCAN  Place 1 spray into the nose once as needed for up to 1 dose. Home med.   NovoLOG  FlexPen 100 UNIT/ML FlexPen Generic drug: insulin  aspart Inject 5-10 Units into the skin 3 (three) times daily with meals. Sliding scale mealtime insulin .   nystatin  ointment Commonly known as: MYCOSTATIN  Apply 1 Application topically 3 (three) times daily as needed. Home med.   oxyCODONE -acetaminophen  5-325 MG tablet Commonly known as: PERCOCET/ROXICET Take 1 tablet by mouth 3 (three) times daily as needed for pain.   PARoxetine  30 MG tablet Commonly known as: PAXIL  Take 30 mg by mouth daily.   polyethylene glycol powder 17 GM/SCOOP powder Commonly known as: GLYCOLAX /MIRALAX  Take 17 g by mouth daily as needed. Home med.   Pulmicort  0.5 MG/2ML nebulizer solution Generic drug: budesonide  Take 0.5 mg by nebulization every 12 (twelve) hours.   terbinafine 1 % cream Commonly known as: LAMISIL Apply 1 application  topically 2 (two) times daily. To affected skin, for 1 week after resolution of rash. # 30 gm, 1 refill.   Trulicity 4.5 MG/0.5ML Soaj Generic drug: Dulaglutide Inject 4.5 mg into the skin once a week.   Vitamin D  (Ergocalciferol ) 1.25 MG (50000 UNIT) Caps capsule Commonly known as: DRISDOL  Take 50,000 Units by mouth every 7 (seven) days.            Disposition: Home   Follow Up Appointments: Dr. Isaiah on Thursday, March 19, 2024 @ 10:30 am  Discharged Condition: Michelle [name] has met maximum benefit of  inpatient care and is medically stable  and cleared for discharge.  Patient is pending follow up as above.      Time spent on disposition:  40 Minutes.     Signed: Inge Lecher, AGACNP-BC [facility] Prefer epic messenger for cross cover needs If after hours, please call E-link

## 2024-03-13 LAB — CULTURE, BLOOD (ROUTINE X 2)
Culture: NO GROWTH
Culture: NO GROWTH

## 2024-03-19 ENCOUNTER — Encounter: Payer: Self-pay | Admitting: Internal Medicine

## 2024-03-19 ENCOUNTER — Ambulatory Visit: Admitting: Internal Medicine

## 2024-03-19 VITALS — BP 130/80 | HR 81 | Temp 98.0°F | Ht 61.0 in | Wt 164.0 lb

## 2024-03-19 DIAGNOSIS — J449 Chronic obstructive pulmonary disease, unspecified: Secondary | ICD-10-CM

## 2024-03-19 DIAGNOSIS — J9621 Acute and chronic respiratory failure with hypoxia: Secondary | ICD-10-CM

## 2024-03-19 DIAGNOSIS — J9611 Chronic respiratory failure with hypoxia: Secondary | ICD-10-CM | POA: Diagnosis not present

## 2024-03-19 DIAGNOSIS — Z9911 Dependence on respirator [ventilator] status: Secondary | ICD-10-CM

## 2024-03-19 DIAGNOSIS — R0689 Other abnormalities of breathing: Secondary | ICD-10-CM

## 2024-03-19 DIAGNOSIS — J9612 Chronic respiratory failure with hypercapnia: Secondary | ICD-10-CM

## 2024-03-19 MED ORDER — ROFLUMILAST 500 MCG PO TABS: 500.0000 ug | ORAL_TABLET | Freq: Every day | ORAL | 11 refills | Status: AC

## 2024-03-19 NOTE — Patient Instructions (Signed)
 Continue nebulized therapy as prescribed Continue ventilator therapy as prescribed Continue oxygen as prescribed Avoid Allergens and Irritants Avoid secondhand smoke Avoid SICK contacts Recommend  Masking  when appropriate Recommend Keep up-to-date with vaccinations Start Daliresp 500 mg daily Pulmonary rehab referral

## 2024-03-19 NOTE — Progress Notes (Signed)
 Name: Michelle [name] MRN: [id number] DOB: August 05, 1960    Synopsis 63 year old female, former smoker.  Last medical history significant for severe COPD with chronic tracheostomy, chronic respiratory failure, asthma, atypical pneumonia/Pseudomonas respiratory infection, history of pneumothorax, tobacco abuse, cocaine abuse, CHF, coronary artery disease, hepatitis C, diabetes mellitus type 2.  Patient of Dr. Isaiah, last seen in office December 08, 2018.  Hospital admissions for COPD exacerbation and multi focal pneumonia.  Michelle [name] readmitted in March for hospital-acquired pneumonia and sepsis. Recurrent bouts of pneumonia Recent admission aspiration pneumonia and COPD exacerbation Pseudomonas (Intermediate to fluoroquinolones) and mixed gram positive/gram negative organisms.  Hospital course 08/28/2019-09/03/2019: Admitted with fevers Treated for tracheitis vs healthcare associated pneumonia with aztreonam , Flagyl , vancomycin  She was placed on vent for full support and started on bronchodilators.  She had full bed physiotherapy and chest PT.       CHIEF COMPLAINT:  Follow-up assessment for respiratory failure Follow-up assessment for COPD Follow-up assessment for tracheostomy vent dependent respiratory failure   HISTORY OF PRESENT ILLNESS: 63 yo woman with hx of COPD c/b chronic hypoxic respiratory failure s/p tracheostomy on home vent, CAD,She has end-stage COPD chronic vent dependent status post trach, patient history of cocaine abuse, Inpatient swallow study however showed no concern for aspiration at [facility]  Patient eating foods by mouth without difficulty   Acute on Chronic Hypoxic Respiratory Failure  COPD: History of severe COPD, previously followed by [facility] pulm. Required tracheostomy 10/2017 after prolonged intubation for COPD exacerbation complicated by pneumonia and difficult vent wean. After failing trach collar trials was discharged with home ventilatory support.    CAD: Had MI in 2009, though  prior records indicate in setting of cocaine use. Continued home ASA and statin.   Oxygen Yes Mode: ventilator  home vent settings  Vent Mode: PRVC FiO2 (%): 40 % S RR: 16 S VT: 450 mL PEEP: 5 cm H20   Recent hospital admission for COPD exacerbation Patient returns today for short follow-up from hospitalization She is alert awake no respiratory distress feels much better Patient continues ventilator therapy No significant issues at this time No exacerbation at this time No evidence of heart failure at this time No evidence or signs of infection at this time No respiratory distress No fevers, chills, nausea, vomiting, diarrhea No evidence of lower extremity edema No evidence hemoptysis    PAST MEDICAL HISTORY :   has a past medical history of Allergy, Anxiety, Asthma, CHF (congestive heart failure) (HCC), Cocaine abuse (HCC), COPD (chronic obstructive pulmonary disease) (HCC), Coronary artery disease, Emphysema/COPD, Hepatitis C, High cholesterol, Hypertension, Pneumothorax, and Tobacco abuse.  has a past surgical history that includes Cardiac catheterization; Chest tube insertion; Tracheostomy tube placement (N/A, 10/17/2017); and IR GASTROSTOMY TUBE MOD SED (10/31/2017). Prior to Admission medications   Medication Sig Start Date End Date Taking? Authorizing Provider  AMBULATORY NON FORMULARY MEDICATION Medication Name: Pulse oximeter DME:[facility] DX J44.9....G03.78 01/08/18   Michelle Vandegrift, MD  aspirin  81 MG chewable tablet Chew 81 mg by mouth at bedtime.     [provider]  budesonide  (PULMICORT ) 0.25 MG/2ML nebulizer solution Take 2 mLs (0.25 mg total) by nebulization 2 (two) times daily. 12/31/17   [name], John, DO  cetirizine  (ZYRTEC ) 10 MG tablet Place 10 mg into feeding tube daily.     [provider]  clonazePAM  (KLONOPIN ) 0.5 MG tablet Place 1 tablet (0.5 mg total) into feeding tube daily. Patient taking differently: Place 1 mg into feeding tube 2 (two) times  daily. (  may also take as needed for anxiety) 01/07/18   Michelle Tory, MD  famotidine  (PEPCID ) 20 MG tablet Place 1 tablet (20 mg total) into feeding tube daily. 12/31/17   [name], John, DO  fentaNYL  (DURAGESIC  - DOSED MCG/HR) 25 MCG/HR patch Place 1 patch (25 mcg total) onto the skin every 3 (three) days. 01/07/18   Michelle Meisinger, MD  fluticasone  (FLONASE ) 50 MCG/ACT nasal spray Place 1 spray into both nostrils daily.     [provider]  ipratropium-albuterol  (DUONEB) 0.5-2.5 (3) MG/3ML SOLN Take 3 mLs by nebulization every 6 (six) hours. Patient taking differently: Take 3 mLs by nebulization every 6 (six) hours as needed (for shortness of breath).  12/31/17   [name], John, DO  mirtazapine  (REMERON ) 30 MG tablet Take 30 mg by mouth at bedtime.    [provider]  oxyCODONE -acetaminophen  (PERCOCET/ROXICET) 5-325 MG tablet Place 1 tablet into feeding tube every 8 (eight) hours as needed for severe pain. 01/07/18   Michelle Pennebaker, MD  PARoxetine  (PAXIL ) 20 MG tablet Place 1 tablet (20 mg total) into feeding tube at bedtime. 12/31/17   [name], John, DO  polyethylene glycol (MIRALAX  / GLYCOLAX ) 17 g packet Place 17 g into feeding tube daily.    [provider]  predniSONE  (DELTASONE ) 20 MG tablet Take 2 tablets (40 mg total) by mouth daily with breakfast. 10/23/18   [name], Michelle G, NP  QUEtiapine  (SEROQUEL ) 25 MG tablet Place 1 tablet (25 mg total) into feeding tube at bedtime. 12/31/17   [name], John, DO  senna (SENOKOT) 8.6 MG TABS tablet Place 1-2 tablets into feeding tube at bedtime.     [provider]  simvastatin  (ZOCOR ) 20 MG tablet Take 40 mg by mouth every evening.     [provider]   Allergies  Allergen Reactions   Lorazepam      Makes anxiety worse  Other Reaction(s): Makes anxiety worse   Lisinopril  Cough   Amoxicillin      Patient has tolerated cephalosporins in the past    FAMILY HISTORY:  family history includes Asthma in her mother;  Breast cancer (age of onset: 62) in her maternal aunt; CAD in her father; COPD in her sister; CVA in her father; Lung cancer in her mother; Stroke in her father. SOCIAL HISTORY:  reports that she quit smoking about 6 years ago. Her smoking use included cigarettes. She started smoking about 47 years ago. She has a 4.1 pack-year smoking history. She has never used smokeless tobacco. She reports that she does not currently use drugs after having used the following drugs: Crack cocaine. She reports that she does not drink alcohol.   LMP 03/06/2005 (Approximate)      Physical Examination:  General Appearance: No distress  EYES EOM intact.   NECK s/p TRACH, attached to VENT Pulmonary: normal breath sounds, No wheezing.  CardiovascularNormal S1,S2.  No m/r/g.   Ext pulses intact, cap refill intact  ALL OTHER ROS ARE NEGATIVE CAN EAT WITH TRACH IN PLACE    MEDICATIONS: I have reviewed all medications and confirmed regimen as documented    ASSESSMENT AND PLAN SYNOPSIS  62 year old obese African-American female with severe end-stage COPD and end-stage chronic hypoxic and hypercapnic respiratory failure with severe debilitating respiratory disease status post tracheostomy chronic vent dependent with previous history of recurrent bouts of pneumonia and aspiration pneumonia and COVID-19 pneumonia  End-stage hypercapnic respiratory failure with COPD Recurrent bouts of COPD exacerbation  Continue ventilator support she needs this for survival Patient uses and  benefits from therapy home vent settings  Vent Mode: PRVC FiO2 (%): 40 % S RR: 16 S VT: 450 mL PEEP: 5 cm H20    End-stage hypoxic respiratory failure Continue oxygen as prescribed Patient needs this for survival   End-stage COPD/respiratory sufficiency Continue nebulized therapy with Pulmicort  nebs twice daily DuoNebs every 4-6 hours Albuterol  nebulizers as needed Recommend pulmonary rehab referral  Recurrent bouts of  COPD exacerbation Will consider phosphodiesterase inhibitor therapy either oral or nebulized therapy Start Daliresp  MEDICATION ADJUSTMENTS/LABS AND TESTS ORDERED: Continue nebulized therapy as prescribed Continue ventilator therapy as prescribed Continue oxygen as prescribed Avoid Allergens and Irritants Avoid secondhand smoke Avoid SICK contacts Recommend  Masking  when appropriate Recommend Keep up-to-date with vaccinations Start Daliresp 500 mg daily Pulmonary rehab referral     CURRENT MEDICATIONS REVIEWED AT LENGTH WITH PATIENT TODAY   Patient  satisfied with Plan of action and management. All questions answered   Follow up 3 months   I spent a total of 48 minutes dedicated to the care of this patient on the date of this encounter to include pre-visit review of records, face-to-face time with the patient discussing conditions above, post visit ordering of testing, clinical documentation with the electronic health record, making appropriate referrals as documented, and communicating necessary information to the patient's healthcare team.    The Patient requires high complexity decision making for assessment and support, frequent evaluation and titration of therapies, application of advanced monitoring technologies and extensive interpretation of multiple databases.  Patient satisfied with Plan of action and management. All questions answered    Nickolas Alm Cellar, M.D.  [facility]  Medical Director [facility]

## 2024-04-24 ENCOUNTER — Telehealth: Payer: Self-pay

## 2024-04-24 DIAGNOSIS — J449 Chronic obstructive pulmonary disease, unspecified: Secondary | ICD-10-CM

## 2024-04-24 DIAGNOSIS — J9610 Chronic respiratory failure, unspecified whether with hypoxia or hypercapnia: Secondary | ICD-10-CM

## 2024-04-24 NOTE — Telephone Encounter (Signed)
 I called Pulm Rehab, NA. Did leave message for some one to call Michelle [name], for an appt.  Patient is aware, and provided another phone number for [facility], [phone]. NFN.    Copied from CRM #[id number]. Topic: Referral - Question >> Apr 23, 2024 12:30 PM [name] wrote: Reason for CRM: patient states she was referred to outpatient rehab at [facility] [phone], she states she has been calling them but has not been able to get ahold of anyone - she is requesting to have the referral sent to another practice - call patient directly or pt's daughter [name] [phone]

## 2024-04-30 ENCOUNTER — Inpatient Hospital Stay
Admission: EM | Admit: 2024-04-30 | Discharge: 2024-05-06 | DRG: 870 | Disposition: A | Attending: Internal Medicine | Admitting: Internal Medicine

## 2024-04-30 ENCOUNTER — Other Ambulatory Visit: Payer: Self-pay

## 2024-04-30 ENCOUNTER — Emergency Department

## 2024-04-30 DIAGNOSIS — R051 Acute cough: Secondary | ICD-10-CM

## 2024-04-30 DIAGNOSIS — R0603 Acute respiratory distress: Secondary | ICD-10-CM

## 2024-04-30 DIAGNOSIS — A419 Sepsis, unspecified organism: Secondary | ICD-10-CM

## 2024-04-30 DIAGNOSIS — J9621 Acute and chronic respiratory failure with hypoxia: Secondary | ICD-10-CM | POA: Diagnosis present

## 2024-04-30 DIAGNOSIS — J95851 Ventilator associated pneumonia: Secondary | ICD-10-CM

## 2024-04-30 DIAGNOSIS — J441 Chronic obstructive pulmonary disease with (acute) exacerbation: Principal | ICD-10-CM

## 2024-04-30 LAB — CBC WITH DIFFERENTIAL/PLATELET
Abs Immature Granulocytes: 0.06 K/uL (ref 0.00–0.07)
Basophils Absolute: 0 K/uL (ref 0.0–0.1)
Basophils Relative: 0 %
Eosinophils Absolute: 0.4 K/uL (ref 0.0–0.5)
Eosinophils Relative: 4 %
HCT: 39.9 % (ref 36.0–46.0)
Hemoglobin: 12.2 g/dL (ref 12.0–15.0)
Immature Granulocytes: 1 %
Lymphocytes Relative: 26 %
Lymphs Abs: 2.6 K/uL (ref 0.7–4.0)
MCH: 25.8 pg — ABNORMAL LOW (ref 26.0–34.0)
MCHC: 30.6 g/dL (ref 30.0–36.0)
MCV: 84.4 fL (ref 80.0–100.0)
Monocytes Absolute: 0.8 K/uL (ref 0.1–1.0)
Monocytes Relative: 8 %
Neutro Abs: 6.2 K/uL (ref 1.7–7.7)
Neutrophils Relative %: 61 %
Platelets: 303 K/uL (ref 150–400)
RBC: 4.73 MIL/uL (ref 3.87–5.11)
RDW: 13.7 % (ref 11.5–15.5)
WBC: 10 K/uL (ref 4.0–10.5)
nRBC: 0 % (ref 0.0–0.2)

## 2024-04-30 MED ORDER — LEVOFLOXACIN IN D5W 750 MG/150ML IV SOLN
750.0000 mg | Freq: Once | INTRAVENOUS | Status: AC
  Administered 2024-05-01: 750 mg via INTRAVENOUS
  Filled 2024-04-30: qty 150

## 2024-04-30 MED ORDER — SODIUM CHLORIDE 0.9 % IV BOLUS: 500.0000 mL | Freq: Once | INTRAVENOUS | Status: DC

## 2024-04-30 MED ORDER — SODIUM CHLORIDE 0.9 % IV BOLUS
1000.0000 mL | Freq: Once | INTRAVENOUS | Status: AC
  Administered 2024-04-30: 1000 mL via INTRAVENOUS

## 2024-04-30 MED ORDER — SODIUM CHLORIDE 0.9 % IV SOLN
2.0000 g | Freq: Once | INTRAVENOUS | Status: AC
  Administered 2024-04-30: 2 g via INTRAVENOUS
  Filled 2024-04-30: qty 12.5

## 2024-04-30 MED ORDER — IPRATROPIUM-ALBUTEROL 0.5-2.5 (3) MG/3ML IN SOLN
9.0000 mL | Freq: Once | RESPIRATORY_TRACT | Status: AC
  Administered 2024-04-30: 9 mL via RESPIRATORY_TRACT
  Filled 2024-04-30: qty 9

## 2024-04-30 MED ORDER — METHYLPREDNISOLONE SODIUM SUCC 125 MG IJ SOLR
125.0000 mg | Freq: Once | INTRAMUSCULAR | Status: AC
  Administered 2024-04-30: 125 mg via INTRAVENOUS
  Filled 2024-04-30: qty 2

## 2024-04-30 MED ORDER — DIAZEPAM 5 MG/ML IJ SOLN
5.0000 mg | Freq: Once | INTRAMUSCULAR | Status: AC
  Administered 2024-04-30: 5 mg via INTRAVENOUS
  Filled 2024-04-30: qty 2

## 2024-04-30 NOTE — ED Triage Notes (Signed)
 [facility] for c/o SOB. Per EMS pt was in bed when she woke up short of breath. Pt states my anxiety upon arrival

## 2024-04-30 NOTE — ED Provider Notes (Addendum)
 [facility] Provider Note    Event Date/Time   First MD Initiated Contact with Patient 04/30/24 2319     (approximate)   History   Shortness of Breath   HPI  Michelle [name] is a 63 y.o. 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  Started tonight with a couple days of worsening cough, nonproductive, not much sputum from her suction from her trach, and accompanied chills tonight as well.  No known sick contacts.  Does have chest tightness but no frank chest pain.  Has aching bilateral lower extremities for the last several weeks.  No history of blood clot but not on blood thinner.  No other acute medical complaints.  External Medical Documents Reviewed: Hospital notes from 03/12/2024      Physical Exam   Triage Vital Signs: ED Triage Vitals  Encounter Vitals Group     BP      Girls Systolic BP Percentile      Girls Diastolic BP Percentile      Boys Systolic BP Percentile      Boys Diastolic BP Percentile      Pulse      Resp      Temp      Temp src      SpO2      Weight      Height      Head Circumference      Peak Flow      Pain Score      Pain Loc      Pain Education      Exclude from Growth Chart     Most recent vital signs: Vitals:   05/01/24 0008 05/01/24 0045  BP:  (!) 151/89  Pulse:  (!) 141  Resp:  (!) 22  Temp:    SpO2: 99% 100%    General: Awake, answering questions appropriately CV:  Good peripheral perfusion.  Resp:  Tachypneic with increased work of breathing.  Decreased lung sounds on the right compared to the left, wheezing throughout. Abd:  No distention.  Other:  No significant peripheral edema  unilateral swelling of the legs, hypertensive, tachycardic, tachypnea   ED Results / Procedures / Treatments   Labs (all labs ordered are listed, but only abnormal results are displayed) Labs Reviewed  COMPREHENSIVE METABOLIC PANEL WITH GFR - Abnormal; Notable for the following components:      Result Value   Potassium 3.4 (*)    Glucose, Bld 210 (*)    Total Protein 8.9 (*)    Alkaline Phosphatase 158 (*)    All other components within normal limits  LACTIC ACID, PLASMA - Abnormal; Notable for the following components:   Lactic Acid, Venous 2.4 (*)    All other components within normal limits  CBC WITH DIFFERENTIAL/PLATELET - Abnormal; Notable for the following components:   MCH 25.8 (*)    All other components within normal limits  RESP PANEL BY RT-PCR (RSV, FLU A&B, COVID)  RVPGX2  CULTURE, BLOOD (ROUTINE X 2)  CULTURE, BLOOD (ROUTINE X 2)  PRO BRAIN NATRIURETIC PEPTIDE  D-DIMER, QUANTITATIVE  PROTIME-INR  LACTIC ACID, PLASMA  BLOOD GAS, VENOUS  TROPONIN T, HIGH SENSITIVITY  TROPONIN T, HIGH SENSITIVITY  I ordered and reviewed the above labs they are notable for lactic of 2.4.  EKG  ED ECG REPORT I, Ginnie Shams, the attending physician, personally viewed and interpreted this ECG.   Date: 04/30/2024  EKG Time: 2358  Rate: 147  Rhythm: sinus tachycardia  Axis: nl  Intervals:nl  ST&T Change: no stemi    RADIOLOGY I independently reviewed and interpreted chest x-ray and see bilateral hilar opacities concerning for pneumonia I also reviewed radiologist's formal read.   PROCEDURES:  Critical Care performed: Yes, see critical care procedure note(s)  .Critical Care  Performed by: Shams Ginnie, MD Authorized by: Shams Ginnie, MD   Critical care provider statement:    Critical care time (minutes):  35   Critical care was time spent personally by me on the following activities:  Development of treatment plan with patient or surrogate, discussions with  consultants, evaluation of patient's response to treatment, examination of patient, ordering and review of laboratory studies, ordering and review of radiographic studies, ordering and performing treatments and interventions, pulse oximetry, re-evaluation of patient's condition and review of old charts    MEDICATIONS ORDERED IN ED: Medications  levofloxacin  (LEVAQUIN ) IVPB 750 mg (750 mg Intravenous New Bag/Given 05/01/24 0028)  sodium chloride  0.9 % bolus 500 mL (has no administration in time range)  ipratropium-albuterol  (DUONEB) 0.5-2.5 (3) MG/3ML nebulizer solution 9 mL (9 mLs Nebulization Given 04/30/24 2342)  methylPREDNISolone  sodium succinate (SOLU-MEDROL ) 125 mg/2 mL injection 125 mg (125 mg Intravenous Given 04/30/24 2342)  sodium chloride  0.9 % bolus 1,000 mL (1,000 mLs Intravenous New Bag/Given 04/30/24 2353)  diazepam  (VALIUM ) injection 5 mg (5 mg Intravenous Given 04/30/24 2349)  ceFEPIme  (MAXIPIME ) 2 g in sodium chloride  0.9 % 100 mL IVPB (0 g Intravenous Stopped 05/01/24 0028)  oxyCODONE  (Oxy IR/ROXICODONE ) immediate release tablet 5 mg (5 mg Oral Given 05/01/24 0104)    External physician / consultants:  I spoke with hospital medicine for admission and regarding care plan for this patient.   IMPRESSION / MDM / ASSESSMENT AND PLAN / ED COURSE  I reviewed the triage vital signs and the nursing notes.                                Patient's presentation is most consistent with acute presentation with potential threat to life or bodily function.  Differential diagnosis includes, but is not limited to, COPD exacerbation, CHF exacerbation, respiratory infection, sepsis, ACS, PE/DVT   The patient is on the cardiac monitor to evaluate for evidence of arrhythmia and/or significant heart rate changes.  MDM:    Is a patient with COPD on a trach on home ventilator with worsening shortness of breath and chills and a dry cough concerning for respiratory infection and COPD  exacerbation evidenced by wheezing on auscultation of the lung fields.  Will treat with DuoNebs, Solu-Medrol , and maintain on ventilator support with the help of respiratory therapist at bedside.  Check COVID/flu/RSV with viral swab.  Check chest x-ray.  Some tightness in the chest more representative of COPD rather than cardiac ischemia, will check EKG and serial troponins.  Feels quite anxious and requesting anxiety medications, has an allergy to Ativan  makes anxiety worse but tolerates Valium  will give Valium  through the IV.  Concern for sepsis given her vital sign abnormalities, will give ventilator associated pneumonia coverage with cefepime /levofloxacin .  Start with a liter of saline but increased to the full 30 cc/kg ideal body weight  fluid bolus after lactic resulted slightly high.  Considered VTE but her bilateral leg soreness has been quite chronic, and I am less suspicious of this driving her shortness of breath and instead much more likely driven by her COPD/respiratory infection.  Given low clinical pretest probability we will start with D-dimer.  Will need hospital admission.  -- Mild lactic acidosis.  Feeling only slightly better after COPD treatment.  Evidence of hilar opacities on chest x-ray concerning for pneumonia, given cefepime /levofloxacin  for ventilator associated pneumonia.  On medical chart review apparently she has had MRSA positive testing in the past so I will add on vancomycin .    Dimer negative.    BNP low with no evidence of pulmonary edema on chest x-ray, doubt CHF exacerbation.    Getting fluids and vital signs improving with heart rate now in the 120s from an original 140s.    Hospital medicine consulted for admission.       FINAL CLINICAL IMPRESSION(S) / ED DIAGNOSES   Final diagnoses:  Acute exacerbation of chronic obstructive pulmonary disease (COPD) (HCC)  Respiratory distress  Acute cough  Ventilator associated pneumonia (HCC)  Sepsis, due  to unspecified organism, unspecified whether acute organ dysfunction present [facility])     Rx / DC Orders   ED Discharge Orders     None        Note:  This document was prepared using Dragon voice recognition software and may include unintentional dictation errors.    Cyrena Mylar, MD 05/01/24 [id number]    Cyrena Mylar, MD 05/01/24 SHOSHANA    Cyrena Mylar, MD 05/01/24 9873    Cyrena Mylar, MD 05/01/24 [phone]

## 2024-05-01 DIAGNOSIS — J9621 Acute and chronic respiratory failure with hypoxia: Secondary | ICD-10-CM | POA: Diagnosis present

## 2024-05-01 LAB — LACTIC ACID, PLASMA
Lactic Acid, Venous: 2.4 mmol/L (ref 0.5–1.9)
Lactic Acid, Venous: 3.8 mmol/L (ref 0.5–1.9)
Lactic Acid, Venous: 4.9 mmol/L (ref 0.5–1.9)
Lactic Acid, Venous: 3.8 mmol/L (ref 0.5–1.9)
Lactic Acid, Venous: 4 mmol/L (ref 0.5–1.9)

## 2024-05-01 LAB — TROPONIN T, HIGH SENSITIVITY
Troponin T High Sensitivity: 18 ng/L (ref 0–19)
Troponin T High Sensitivity: 21 ng/L — ABNORMAL HIGH (ref 0–19)

## 2024-05-01 LAB — COMPREHENSIVE METABOLIC PANEL WITH GFR
ALT: 20 U/L (ref 0–44)
AST: 27 U/L (ref 15–41)
Albumin: 4.5 g/dL (ref 3.5–5.0)
Alkaline Phosphatase: 158 U/L — ABNORMAL HIGH (ref 38–126)
Anion gap: 12 (ref 5–15)
BUN: 10 mg/dL (ref 8–23)
CO2: 29 mmol/L (ref 22–32)
Calcium: 10 mg/dL (ref 8.9–10.3)
Chloride: 102 mmol/L (ref 98–111)
Creatinine, Ser: 0.86 mg/dL (ref 0.44–1.00)
GFR, Estimated: >60 mL/min (ref >60)
Glucose, Bld: 210 mg/dL — ABNORMAL HIGH (ref 70–99)
Potassium: 3.4 mmol/L — ABNORMAL LOW (ref 3.5–5.1)
Sodium: 142 mmol/L (ref 135–145)
Total Bilirubin: 0.3 mg/dL (ref 0.0–1.2)
Total Protein: 8.9 g/dL — ABNORMAL HIGH (ref 6.5–8.1)

## 2024-05-01 LAB — URINALYSIS, W/ REFLEX TO CULTURE (INFECTION SUSPECTED)
Bilirubin Urine: NEGATIVE
Glucose, UA: >=500 mg/dL — AB
Hgb urine dipstick: NEGATIVE
Ketones, ur: NEGATIVE mg/dL
Leukocytes,Ua: NEGATIVE
Nitrite: NEGATIVE
Protein, ur: NEGATIVE mg/dL
Specific Gravity, Urine: 1.024 (ref 1.005–1.030)
pH: 5 (ref 5.0–8.0)

## 2024-05-01 LAB — CBC
HCT: 35.6 % — ABNORMAL LOW (ref 36.0–46.0)
Hemoglobin: 10.9 g/dL — ABNORMAL LOW (ref 12.0–15.0)
MCH: 25.8 pg — ABNORMAL LOW (ref 26.0–34.0)
MCHC: 30.6 g/dL (ref 30.0–36.0)
MCV: 84.4 fL (ref 80.0–100.0)
Platelets: 263 K/uL (ref 150–400)
RBC: 4.22 MIL/uL (ref 3.87–5.11)
RDW: 13.8 % (ref 11.5–15.5)
WBC: 10.4 K/uL (ref 4.0–10.5)
nRBC: 0 % (ref 0.0–0.2)

## 2024-05-01 LAB — PRO BRAIN NATRIURETIC PEPTIDE: Pro Brain Natriuretic Peptide: 99.1 pg/mL (ref <300.0)

## 2024-05-01 LAB — D-DIMER, QUANTITATIVE: D-Dimer, Quant: 0.3 ug{FEU}/mL (ref 0.00–0.50)

## 2024-05-01 LAB — STREP PNEUMONIAE URINARY ANTIGEN: Strep Pneumo Urinary Antigen: NEGATIVE

## 2024-05-01 LAB — HIV ANTIBODY (ROUTINE TESTING W REFLEX): HIV Screen 4th Generation wRfx: NONREACTIVE

## 2024-05-01 LAB — CBG MONITORING, ED
Glucose-Capillary: 305 mg/dL — ABNORMAL HIGH (ref 70–99)
Glucose-Capillary: 402 mg/dL — ABNORMAL HIGH (ref 70–99)
Glucose-Capillary: 351 mg/dL — ABNORMAL HIGH (ref 70–99)

## 2024-05-01 LAB — MRSA NEXT GEN BY PCR, NASAL
MRSA by PCR Next Gen: NOT DETECTED
MRSA by PCR Next Gen: NOT DETECTED

## 2024-05-01 LAB — BASIC METABOLIC PANEL WITH GFR
Anion gap: 16 — ABNORMAL HIGH (ref 5–15)
BUN: 10 mg/dL (ref 8–23)
CO2: 22 mmol/L (ref 22–32)
Calcium: 9.1 mg/dL (ref 8.9–10.3)
Chloride: 102 mmol/L (ref 98–111)
Creatinine, Ser: 0.92 mg/dL (ref 0.44–1.00)
GFR, Estimated: >60 mL/min (ref >60)
Glucose, Bld: 404 mg/dL — ABNORMAL HIGH (ref 70–99)
Potassium: 3.2 mmol/L — ABNORMAL LOW (ref 3.5–5.1)
Sodium: 139 mmol/L (ref 135–145)

## 2024-05-01 LAB — PROTIME-INR
INR: 1 (ref 0.8–1.2)
Prothrombin Time: 13.5 s (ref 11.4–15.2)

## 2024-05-01 LAB — PHOSPHORUS: Phosphorus: 2.5 mg/dL (ref 2.5–4.6)

## 2024-05-01 LAB — RESP PANEL BY RT-PCR (RSV, FLU A&B, COVID)  RVPGX2
Influenza A by PCR: NEGATIVE
Influenza B by PCR: NEGATIVE
Resp Syncytial Virus by PCR: NEGATIVE
SARS Coronavirus 2 by RT PCR: NEGATIVE

## 2024-05-01 LAB — POTASSIUM: Potassium: 4.6 mmol/L (ref 3.5–5.1)

## 2024-05-01 LAB — GLUCOSE, CAPILLARY
Glucose-Capillary: 339 mg/dL — ABNORMAL HIGH (ref 70–99)
Glucose-Capillary: 371 mg/dL — ABNORMAL HIGH (ref 70–99)
Glucose-Capillary: 251 mg/dL — ABNORMAL HIGH (ref 70–99)

## 2024-05-01 LAB — PROCALCITONIN: Procalcitonin: 0.12 ng/mL

## 2024-05-01 LAB — MAGNESIUM
Magnesium: 1.7 mg/dL (ref 1.7–2.4)
Magnesium: 2.3 mg/dL (ref 1.7–2.4)

## 2024-05-01 MED ORDER — ORAL CARE MOUTH RINSE
15.0000 mL | OROMUCOSAL | Status: DC
  Administered 2024-05-01 – 2024-05-06 (×41): 15 mL via OROMUCOSAL

## 2024-05-01 MED ORDER — BUDESONIDE 0.25 MG/2ML IN SUSP
0.2500 mg | Freq: Two times a day (BID) | RESPIRATORY_TRACT | Status: DC
  Administered 2024-05-01 – 2024-05-06 (×11): 0.25 mg via RESPIRATORY_TRACT
  Filled 2024-05-01 (×11): qty 2

## 2024-05-01 MED ORDER — OXYCODONE HCL 5 MG PO TABS
5.0000 mg | ORAL_TABLET | Freq: Once | ORAL | Status: AC
Start: 2024-05-01 — End: 2024-05-01
  Administered 2024-05-01: 5 mg via ORAL
  Filled 2024-05-01: qty 1

## 2024-05-01 MED ORDER — INSULIN ASPART 100 UNIT/ML IJ SOLN
0.0000 [IU] | Freq: Every day | INTRAMUSCULAR | Status: DC
  Administered 2024-05-01: 3 [IU] via SUBCUTANEOUS
  Administered 2024-05-02: 2 [IU] via SUBCUTANEOUS
  Administered 2024-05-04: 3 [IU] via SUBCUTANEOUS
  Filled 2024-05-01: qty 2
  Filled 2024-05-01 (×2): qty 3

## 2024-05-01 MED ORDER — MIRTAZAPINE 15 MG PO TABS: 45.0000 mg | ORAL_TABLET | Freq: Every day | ORAL | Status: DC

## 2024-05-01 MED ORDER — ASPIRIN 81 MG PO CHEW
81.0000 mg | CHEWABLE_TABLET | Freq: Every day | ORAL | Status: DC
  Administered 2024-05-02 – 2024-05-06 (×5): 81 mg via ORAL
  Filled 2024-05-01 (×5): qty 1

## 2024-05-01 MED ORDER — MAGNESIUM SULFATE 2 GM/50ML IV SOLN
2.0000 g | Freq: Once | INTRAVENOUS | Status: AC
  Administered 2024-05-01: 2 g via INTRAVENOUS
  Filled 2024-05-01: qty 50

## 2024-05-01 MED ORDER — SODIUM CHLORIDE 0.9 % IV BOLUS
500.0000 mL | Freq: Once | INTRAVENOUS | Status: AC
Start: 2024-05-01 — End: 2024-05-01
  Administered 2024-05-01: 500 mL via INTRAVENOUS

## 2024-05-01 MED ORDER — POLYETHYLENE GLYCOL 3350 17 G PO PACK: 17.0000 g | PACK | Freq: Every day | ORAL | Status: DC | PRN

## 2024-05-01 MED ORDER — PAROXETINE HCL 30 MG PO TABS
30.0000 mg | ORAL_TABLET | Freq: Every day | ORAL | Status: DC
  Administered 2024-05-02 – 2024-05-06 (×5): 30 mg via ORAL
  Filled 2024-05-01 (×5): qty 1

## 2024-05-01 MED ORDER — INSULIN ASPART 100 UNIT/ML IJ SOLN
0.0000 [IU] | Freq: Every day | INTRAMUSCULAR | Status: DC
  Administered 2024-05-01: 4 [IU] via SUBCUTANEOUS
  Filled 2024-05-01: qty 4

## 2024-05-01 MED ORDER — IPRATROPIUM-ALBUTEROL 0.5-2.5 (3) MG/3ML IN SOLN
RESPIRATORY_TRACT | Status: AC
  Filled 2024-05-01: qty 3

## 2024-05-01 MED ORDER — VANCOMYCIN HCL 2000 MG/400ML IV SOLN
2000.0000 mg | Freq: Once | INTRAVENOUS | Status: AC
  Administered 2024-05-01: 2000 mg via INTRAVENOUS
  Filled 2024-05-01: qty 400

## 2024-05-01 MED ORDER — ADULT MULTIVITAMIN W/MINERALS CH
1.0000 | ORAL_TABLET | Freq: Every day | ORAL | Status: DC
  Administered 2024-05-02 – 2024-05-06 (×5): 1 via ORAL
  Filled 2024-05-01 (×5): qty 1

## 2024-05-01 MED ORDER — CHLORHEXIDINE GLUCONATE CLOTH 2 % EX PADS
6.0000 | MEDICATED_PAD | Freq: Every day | CUTANEOUS | Status: DC
  Administered 2024-05-01 – 2024-05-06 (×6): 6 via TOPICAL
  Filled 2024-05-01: qty 6

## 2024-05-01 MED ORDER — IPRATROPIUM-ALBUTEROL 0.5-2.5 (3) MG/3ML IN SOLN
3.0000 mL | RESPIRATORY_TRACT | Status: DC | PRN
  Administered 2024-05-01 – 2024-05-06 (×10): 3 mL via RESPIRATORY_TRACT
  Filled 2024-05-01 (×9): qty 3

## 2024-05-01 MED ORDER — MORPHINE SULFATE (PF) 2 MG/ML IV SOLN
2.0000 mg | INTRAVENOUS | Status: DC | PRN
  Administered 2024-05-01 – 2024-05-05 (×13): 2 mg via INTRAVENOUS
  Filled 2024-05-01 (×14): qty 1

## 2024-05-01 MED ORDER — OXYCODONE-ACETAMINOPHEN 5-325 MG PO TABS
1.0000 | ORAL_TABLET | Freq: Three times a day (TID) | ORAL | Status: DC | PRN
  Administered 2024-05-02 – 2024-05-05 (×8): 1 via ORAL
  Filled 2024-05-01 (×8): qty 1

## 2024-05-01 MED ORDER — INSULIN ASPART 100 UNIT/ML IJ SOLN
0.0000 [IU] | Freq: Three times a day (TID) | INTRAMUSCULAR | Status: DC
  Administered 2024-05-02: 11 [IU] via SUBCUTANEOUS
  Administered 2024-05-02 (×2): 7 [IU] via SUBCUTANEOUS
  Administered 2024-05-03: 4 [IU] via SUBCUTANEOUS
  Administered 2024-05-03: 15 [IU] via SUBCUTANEOUS
  Administered 2024-05-03: 3 [IU] via SUBCUTANEOUS
  Administered 2024-05-04: 4 [IU] via SUBCUTANEOUS
  Administered 2024-05-04: 7 [IU] via SUBCUTANEOUS
  Administered 2024-05-04: 3 [IU] via SUBCUTANEOUS
  Administered 2024-05-05: 15 [IU] via SUBCUTANEOUS
  Administered 2024-05-05: 11 [IU] via SUBCUTANEOUS
  Administered 2024-05-05: 4 [IU] via SUBCUTANEOUS
  Administered 2024-05-06: 3 [IU] via SUBCUTANEOUS
  Filled 2024-05-01: qty 3
  Filled 2024-05-01: qty 4
  Filled 2024-05-01: qty 7
  Filled 2024-05-01: qty 15
  Filled 2024-05-01: qty 4
  Filled 2024-05-01: qty 7
  Filled 2024-05-01: qty 15
  Filled 2024-05-01: qty 3
  Filled 2024-05-01: qty 7
  Filled 2024-05-01: qty 3
  Filled 2024-05-01: qty 11

## 2024-05-01 MED ORDER — INSULIN GLARGINE-YFGN 100 UNIT/ML ~~LOC~~ SOLN
20.0000 [IU] | Freq: Two times a day (BID) | SUBCUTANEOUS | Status: DC
  Administered 2024-05-01 – 2024-05-06 (×10): 20 [IU] via SUBCUTANEOUS
  Filled 2024-05-01 (×11): qty 0.2

## 2024-05-01 MED ORDER — METHYLPREDNISOLONE SODIUM SUCC 40 MG IJ SOLR
40.0000 mg | Freq: Every day | INTRAMUSCULAR | Status: AC
  Administered 2024-05-02 – 2024-05-06 (×5): 40 mg via INTRAVENOUS
  Filled 2024-05-01 (×5): qty 1

## 2024-05-01 MED ORDER — AMLODIPINE BESYLATE 5 MG PO TABS
5.0000 mg | ORAL_TABLET | Freq: Every day | ORAL | Status: DC
  Administered 2024-05-01 – 2024-05-06 (×6): 5 mg via ORAL
  Filled 2024-05-01 (×6): qty 1

## 2024-05-01 MED ORDER — ENOXAPARIN SODIUM 60 MG/0.6ML IJ SOSY
0.5000 mg/kg | PREFILLED_SYRINGE | INTRAMUSCULAR | Status: DC
  Administered 2024-05-01 – 2024-05-03 (×3): 50 mg via SUBCUTANEOUS
  Filled 2024-05-01 (×4): qty 0.6

## 2024-05-01 MED ORDER — ENOXAPARIN SODIUM 30 MG/0.3ML IJ SOSY
30.0000 mg | PREFILLED_SYRINGE | Freq: Two times a day (BID) | INTRAMUSCULAR | Status: DC
  Administered 2024-05-01: 30 mg via SUBCUTANEOUS
  Filled 2024-05-01: qty 0.3

## 2024-05-01 MED ORDER — HYDROXYZINE HCL 25 MG PO TABS
25.0000 mg | ORAL_TABLET | Freq: Four times a day (QID) | ORAL | Status: DC | PRN
  Administered 2024-05-02 – 2024-05-06 (×5): 25 mg via ORAL
  Filled 2024-05-01 (×5): qty 1

## 2024-05-01 MED ORDER — MIRTAZAPINE 15 MG PO TABS
45.0000 mg | ORAL_TABLET | Freq: Every day | ORAL | Status: DC
  Administered 2024-05-01 – 2024-05-05 (×5): 45 mg via ORAL
  Filled 2024-05-01 (×5): qty 3

## 2024-05-01 MED ORDER — IPRATROPIUM-ALBUTEROL 0.5-2.5 (3) MG/3ML IN SOLN
3.0000 mL | Freq: Four times a day (QID) | RESPIRATORY_TRACT | Status: DC
  Administered 2024-05-01 – 2024-05-02 (×7): 3 mL via RESPIRATORY_TRACT
  Filled 2024-05-01 (×7): qty 3

## 2024-05-01 MED ORDER — POTASSIUM CHLORIDE 10 MEQ/100ML IV SOLN
10.0000 meq | INTRAVENOUS | Status: AC
  Administered 2024-05-01 (×4): 10 meq via INTRAVENOUS
  Filled 2024-05-01 (×4): qty 100

## 2024-05-01 MED ORDER — CLONAZEPAM 0.25 MG PO TBDP
0.5000 mg | ORAL_TABLET | Freq: Three times a day (TID) | ORAL | Status: DC
  Administered 2024-05-01 – 2024-05-06 (×15): 0.5 mg via ORAL
  Filled 2024-05-01 (×9): qty 4
  Filled 2024-05-01: qty 2
  Filled 2024-05-01 (×5): qty 4

## 2024-05-01 MED ORDER — SODIUM CHLORIDE 0.9 % IV SOLN
500.0000 mg | INTRAVENOUS | Status: DC
  Administered 2024-05-01 – 2024-05-04 (×3): 500 mg via INTRAVENOUS
  Filled 2024-05-01 (×3): qty 5

## 2024-05-01 MED ORDER — ASPIRIN 81 MG PO CHEW
81.0000 mg | CHEWABLE_TABLET | Freq: Every day | ORAL | Status: DC
  Administered 2024-05-01: 81 mg via ORAL
  Filled 2024-05-01: qty 1

## 2024-05-01 MED ORDER — INSULIN GLARGINE-YFGN 100 UNIT/ML ~~LOC~~ SOLN
15.0000 [IU] | Freq: Every day | SUBCUTANEOUS | Status: DC
  Filled 2024-05-01: qty 0.15

## 2024-05-01 MED ORDER — IPRATROPIUM-ALBUTEROL 0.5-2.5 (3) MG/3ML IN SOLN: 3.0000 mL | Freq: Four times a day (QID) | RESPIRATORY_TRACT | Status: DC | PRN

## 2024-05-01 MED ORDER — DOCUSATE SODIUM 100 MG PO CAPS: 100.0000 mg | ORAL_CAPSULE | Freq: Two times a day (BID) | ORAL | Status: DC | PRN

## 2024-05-01 MED ORDER — ENOXAPARIN SODIUM 40 MG/0.4ML IJ SOSY: 40.0000 mg | PREFILLED_SYRINGE | Freq: Two times a day (BID) | INTRAMUSCULAR | Status: DC

## 2024-05-01 MED ORDER — SODIUM CHLORIDE 0.9 % IV SOLN
2.0000 g | Freq: Three times a day (TID) | INTRAVENOUS | Status: AC
  Administered 2024-05-01 – 2024-05-05 (×13): 2 g via INTRAVENOUS
  Filled 2024-05-01 (×15): qty 12.5

## 2024-05-01 MED ORDER — CLONAZEPAM 0.5 MG PO TABS
0.5000 mg | ORAL_TABLET | Freq: Three times a day (TID) | ORAL | Status: DC
  Administered 2024-05-01 (×2): 0.5 mg via ORAL
  Filled 2024-05-01 (×2): qty 1

## 2024-05-01 MED ORDER — METHYLPREDNISOLONE SODIUM SUCC 40 MG IJ SOLR
40.0000 mg | Freq: Two times a day (BID) | INTRAMUSCULAR | Status: DC
  Administered 2024-05-01: 40 mg via INTRAVENOUS
  Filled 2024-05-01: qty 1

## 2024-05-01 MED ORDER — ORAL CARE MOUTH RINSE: 15.0000 mL | OROMUCOSAL | Status: DC | PRN

## 2024-05-01 MED ORDER — INSULIN ASPART 100 UNIT/ML IJ SOLN
0.0000 [IU] | Freq: Three times a day (TID) | INTRAMUSCULAR | Status: DC
  Administered 2024-05-01 (×3): 15 [IU] via SUBCUTANEOUS
  Filled 2024-05-01 (×3): qty 15

## 2024-05-01 MED ORDER — PAROXETINE HCL 30 MG PO TABS
30.0000 mg | ORAL_TABLET | Freq: Every day | ORAL | Status: DC
  Administered 2024-05-01: 30 mg via ORAL
  Filled 2024-05-01: qty 1

## 2024-05-01 NOTE — Progress Notes (Signed)
 ED Pharmacy Antibiotic Sign Off An antibiotic consult was received from an ED provider for Vancomycin  per pharmacy dosing for pneumonia. A chart review was completed to assess appropriateness.   The following one time order(s) were placed:  Vancomycin  2000 mg IV x 1  Further antibiotic and/or antibiotic pharmacy consults should be ordered by the admitting provider if indicated.   Thank you for allowing pharmacy to be a part of this patient's care.   Olam KANDICE Fritter, [facility]  Clinical Pharmacist 05/01/24 1:34 AM

## 2024-05-01 NOTE — ED Notes (Signed)
 RT at bedside to suction patient

## 2024-05-01 NOTE — ED Notes (Addendum)
 Dr Isaiah messaged: I know this pt came in last night and stated her anxiety was up. Right now she is stating she feels shaky and that her L side of her face is jumpy. She asked me what stroke symptoms were. Just wanted to let you know. She looks the same to me as when I came in at 11 am. I did a stroke scale and it was 0 for me except for the headache. She said she came in with one but it went away and just came back  Dr Isaiah responded: Ok and Continue to monitor

## 2024-05-01 NOTE — ED Notes (Signed)
 RT at bedside administering breathing treatment.

## 2024-05-01 NOTE — ED Notes (Addendum)
 Respiratory therapist in pt room

## 2024-05-01 NOTE — Plan of Care (Signed)

## 2024-05-01 NOTE — H&P (Addendum)
 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, HTN, HLD, CHF, CAD s/p STEMI s/ stent 2009, DM type 2 w/ insulin  dependence, Hepatitis C, Former tobacco and cocaine use, Anxiety/Depression, and Obesity who presented to the ED with chief complaints of progressive shortness of breath  ED Course: Initial vital signs showed HR: 144 bpm, BP: 167/136 mmHg, RR: 30 breaths/min, Temp: 99.1F (37.6C), SpO2: 99%,patient was placed on the vent with Respiratory Support: Ventilator settings: Mode: PRVC, Set Rate: 16 bpm, Vt: 400 mL, PEEP: 5 cm H2O, Pressure Support: 10 cm H2O  Labs/Diagnostics: CBC: WBC: 10.0, Hgb: 12.2, Hct: 39.9,PLT: 303 Basic Metabolic Panel: Na+: 142, K+: 3.4 (low), Cl-: 102, CO2: 29, Glucose: 210 (high), BUN: 10, Creatinine: 0.86, Calcium : 10.0 Lactate: 2.4  ABG: Pending results for pO2, pCO2, pH, HCO3 Chest X-ray: Chronic left lateral basal sulcal blunting. Increased opacity in infrahilar areas, suggestive of atelectasis or aspiration changes.  Medications Administered: Cefepime  2g IV, Levofloxacin  750 mg IV, DuoNeb's, Valium  5mg  IV, Solumedrol 125mg  IV, 1L NS bolus. PCCM consulted for admission.  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 Cultures pending, broad spectrum antibiotics.   Consults:  None  Procedures:  None  Interim History / Subjective:      Micro Data:  11/27: SARS-CoV-2 PCR> negative 11/27: Influenza PCR> negative 11/28: Respiratory Viral Panel> 11/27: Blood culture x2> 11/28: Urine Culture> 11/28: MRSA PCR>>  11/28: Strep pneumo Ur Ag 11/28: Legionella Ur Ag>  Antimicrobials:   Antibiotics Given (last 72 hours)     Date/Time Action Medication Dose Rate   04/30/24 2349 New Bag/Given   ceFEPIme  (MAXIPIME ) 2 g in sodium chloride  0.9 % 100 mL IVPB 2 g 200 mL/hr   05/01/24 0028 New Bag/Given   levofloxacin  (LEVAQUIN ) IVPB 750 mg 750 mg 100 mL/hr   05/01/24 0200 New Bag/Given   vancomycin  (VANCOREADY) IVPB 2000 mg/400 mL 2,000 mg 200 mL/hr      OBJECTIVE  Blood pressure (!) 167/136, pulse (!) 144, temperature 99.6 F (37.6 C), temperature source Oral, resp. rate (!) 27, height 5' 0.98 (1.549 m), weight 97.5 kg, last menstrual period 03/06/2005, SpO2 99%.    Vent Mode: Other (Comment) Set Rate: [16 bmp] 16 bmp Vt Set: [400 mL] 400 mL PEEP: [5 cmH20] 5 cmH20 Pressure Support: [10 cmH20] 10 cmH20   Intake/Output Summary (Last 24 hours) at 05/01/2024 0028 Last data filed at 05/01/2024 0028 Gross per 24 hours  Intake 100 ml  Output --  Net 100 ml   Filed Weights   04/30/24 2324  Weight: 97.5 kg   Physical Examination  GEN: Critically ill patient, WDWN in NAD HEENT: [place]/AT. PERRL, sclerae anicteric. HEART: regular rhythm, normal rate, S1, S2, no M/R/G,  LUNGS: CTAB, mild crackles with moderate wheezes, no increased WOB, on home vent EXTREMITIES: No Edema, cap refill  NEURO:  No gross focal deficits. PSYCH:  Mood and Affect: Mood normal.  ABDOMINAL: Soft: BS x 4, NTND SKIN: Intact, warm, no rashes lesion, or ulcer  Labs/imaging that I have personally reviewed  (right click and Reselect all Smart List Selections daily)   DG Chest 1 View Result Date:  05/01/2024 EXAM: 1 VIEW(S) XRAY OF THE CHEST 04/30/2024 11:49:00 PM COMPARISON: Portable chest 03/11/2024. CLINICAL HISTORY: SOB. FINDINGS: LINES, TUBES AND DEVICES: Tracheostomy cannula positioning is unchanged. Multiple overlying monitor wires. LUNGS AND PLEURA: Chronic left lateral basal sulcal blunting. Increased opacity of the infrahilar areas, which could reflect atelectasis or changes of recent aspiration. The remainder of the lungs is clear. No pleural effusion. No pneumothorax. HEART AND MEDIASTINUM: The heart size is normal. Stable mediastinum with aortic atherosclerosis. BONES AND SOFT TISSUES: No acute osseous abnormality. IMPRESSION: 1. Increased infrahilar opacities, which may represent atelectasis or recent aspiration changes. 2. Chronic left lateral basal sulcal blunting. Electronically signed by: Francis Quam MD 05/01/2024 12:03 AM EST RP Workstation: [id number]    Labs   CBC: Recent Labs  Lab 04/30/24 2326  WBC 10.0  NEUTROABS 6.2  HGB 12.2  HCT 39.9  MCV 84.4  PLT 303   Basic Metabolic Panel: Recent Labs  Lab 04/30/24 2326  NA 142  K 3.4*  CL 102  CO2 29  GLUCOSE 210*  BUN 10  CREATININE 0.86  CALCIUM  10.0   GFR: Estimated Creatinine Clearance: 72.5 mL/min (by C-G formula based on SCr of 0.86 mg/dL). Recent Labs  Lab 04/30/24 2326  WBC 10.0  LATICACIDVEN 2.4*   Liver Function Tests: Recent Labs  Lab 04/30/24 2326  AST 27  ALT 20  ALKPHOS 158*  BILITOT 0.3  PROT 8.9*  ALBUMIN  4.5   No results for input(s): LIPASE, AMYLASE in the last 168 hours. No results for input(s): AMMONIA in the last 168 hours.  ABG    Component Value Date/Time   PHART 7.38 03/12/2024 1053   PCO2ART 50 (H) 03/12/2024 1053   PO2ART 75 (L) 03/12/2024 1053   HCO3 29.6 (H) 03/12/2024 1053   ACIDBASEDEF 1.5 08/29/2023 0307   O2SAT 98 03/12/2024 1053     Coagulation Profile: No results for input(s): INR, PROTIME in the last 168 hours.  Cardiac Enzymes: No  results for input(s): CKTOTAL, CKMB, CKMBINDEX, TROPONINI in the last 168 hours.  HbA1C: Hemoglobin A1C  Date/Time Value Ref Range Status  06/29/2013 04:02 AM 6.0 4.2 - 6.3 % Final    Comment:    The American Diabetes Association recommends that a primary goal of therapy should be <7% and that physicians should reevaluate the treatment regimen in patients with HbA1c values consistently >8%.    Hgb A1c MFr Bld  Date/Time Value Ref Range Status  03/10/2024 04:17 AM 8.7 (H) 4.8 - 5.6 % Final    Comment:    (NOTE) Diagnosis of Diabetes The following HbA1c ranges recommended by the American Diabetes Association (ADA) may be used as an aid in the diagnosis of diabetes mellitus.  Hemoglobin             Suggested A1C NGSP%              Diagnosis  <5.7                   Non Diabetic  5.7-6.4                Pre-Diabetic  >6.4  Diabetic  <7.0                   Glycemic control for                       adults with diabetes.    08/29/2023 01:01 AM 9.4 (H) 4.8 - 5.6 % Final    Comment:    (NOTE) Pre diabetes:          5.7%-6.4%  Diabetes:              >6.4%  Glycemic control for   <7.0% adults with diabetes     CBG: No results for input(s): GLUCAP in the last 168 hours.  Review of Systems:   Unable to be obtained secondary to the patient's intubated and sedated status.   Past Medical History  She, has a past medical history of Allergy, Anxiety, Asthma, CHF (congestive heart failure) (HCC), Cocaine abuse (HCC), COPD (chronic obstructive pulmonary disease) (HCC), Coronary artery disease, Emphysema/COPD, Hepatitis C, High cholesterol, Hypertension, Pneumothorax, and Tobacco abuse.   Surgical History    Past Surgical History:  Procedure Laterality Date   CARDIAC CATHETERIZATION     CHEST TUBE INSERTION     IR GASTROSTOMY TUBE MOD SED  10/31/2017   TRACHEOSTOMY TUBE PLACEMENT N/A 10/17/2017   Procedure: TRACHEOSTOMY; Surgeon: Milissa Hamming,  MD; Location: [facility] ORS; Service: ENT; Laterality: N/A;   Social History   reports that she quit smoking about 6 years ago. Her smoking use included cigarettes. She started smoking about 47 years ago. She has a 4.1 pack-year smoking history. She has never used smokeless tobacco. She reports that she does not currently use drugs after having used the following drugs: Crack cocaine. She reports that she does not drink alcohol.   Family History   Her family history includes Asthma in her mother; Breast cancer (age of onset: 64) in her maternal aunt; CAD in her father; COPD in her sister; CVA in her father; Lung cancer in her mother; Stroke in her father.   Allergies Allergies  Allergen Reactions   Lorazepam      Makes anxiety worse  Other Reaction(s): Makes anxiety worse   Lisinopril  Cough   Amoxicillin      Patient has tolerated cephalosporins in the past     Home Medications  Prior to Admission medications   Medication Sig Start Date End Date Taking? Authorizing Provider  acetaminophen  (TYLENOL ) 325 MG tablet Take 2 tablets (650 mg total) by mouth every 6 (six) hours as needed for mild pain (pain score 1-3) (or Fever >/= 101). 04/24/23   Dorinda Drue DASEN, MD  albuterol  (PROVENTIL ) (2.5 MG/3ML) 0.083% nebulizer solution Take 3 mLs (2.5 mg total) by nebulization every 6 (six) hours as needed for wheezing or shortness of breath. 03/27/23   Tamea Dedra CROME, MD  albuterol  (VENTOLIN  HFA) 108 (90 Base) MCG/ACT inhaler Inhale 2 puffs into the lungs every 6 (six) hours as needed for wheezing or shortness of breath. 04/02/22   Parrett, Madelin RAMAN, NP  amLODipine  (NORVASC ) 5 MG tablet Take 5 mg by mouth daily. 08/27/21   [provider]  aspirin  81 MG chewable tablet Chew 81 mg by mouth daily.     [provider]  azelastine  (ASTELIN ) 0.1 % nasal spray SMARTSIG:1-2 Spray(s) Both Nares Every 12 Hours PRN 09/22/20   [provider]  azithromycin  (ZITHROMAX  Z-PAK) 250 MG tablet  Take 500 mg by mouth daily for one day followed by 250  mg daily by mouth for four days 03/12/24   Shellia Inge BIRCH, NP  Blood Glucose Monitoring Suppl ([employer] BLOOD GLUCOSE METER) w/Device KIT Use as directed to monitor blood glucose up to qid 02/22/23   Alexander, Natalie, DO  cetirizine  (ZYRTEC ) 10 MG tablet Take 1 tablet (10 mg total) by mouth daily as needed for allergies. Home med. 12/20/23   Awanda City, MD  clonazePAM  (KLONOPIN ) 0.5 MG tablet Take 0.5 mg by mouth in the morning, at noon, and at bedtime.    [provider]  cloNIDine  (CATAPRES  - DOSED IN MG/24 HR) 0.1 mg/24hr patch Place 0.1 mg onto the skin once a week.    [provider]  docusate sodium  (COLACE) 100 MG capsule Take 2 capsules (200 mg total) by mouth daily as needed for mild constipation. 04/24/23   Dorinda Drue DASEN, MD  famotidine  (PEPCID ) 20 MG tablet Take 1 tablet by mouth daily as needed for heartburn 09/24/23   [provider]  fluconazole  (DIFLUCAN ) 150 MG tablet Take 1 tablet (150 mg total) by mouth once a week. 03/02/24   Brimage, Vondra, DO  fluticasone  (FLONASE ) 50 MCG/ACT nasal spray Place 1 spray into both nostrils daily as needed for allergies or rhinitis. Home med. 12/20/23   Awanda City, MD  guaiFENesin  (MUCINEX ) 600 MG 12 hr tablet Take 600 mg by mouth 2 (two) times daily.    [provider]  hydrOXYzine  (VISTARIL ) 25 MG capsule Take 25 mg by mouth every 6 (six) hours as needed. 10/01/22   [provider]  ipratropium-albuterol  (DUONEB) 0.5-2.5 (3) MG/3ML SOLN Take 3 mLs by nebulization every 6 (six) hours as needed (for shortness of breath). 06/17/23   Kasa, Kurian, MD  LANTUS  SOLOSTAR 100 UNIT/ML Solostar Pen Inject 40 Units into the skin at bedtime. 01/03/23   [provider]  mirtazapine  (REMERON ) 45 MG tablet Take 45 mg by mouth at bedtime. 01/07/24   [provider]  Multiple Vitamin (MULTIVITAMIN WITH MINERALS) TABS tablet Take 1 tablet by mouth daily. 12/20/23    Awanda City, MD  naloxone  (NARCAN ) nasal spray 4 mg/0.1 mL Place 1 spray into the nose once as needed for up to 1 dose. Home med. 12/20/23   Awanda City, MD  NOVOLOG  FLEXPEN 100 UNIT/ML FlexPen Inject 5-10 Units into the skin 3 (three) times daily with meals. Sliding scale mealtime insulin . 03/01/21   [provider]  nystatin  ointment (MYCOSTATIN ) Apply 1 Application topically 3 (three) times daily as needed. Home med. 12/20/23   Awanda City, MD  oxyCODONE -acetaminophen  (PERCOCET/ROXICET) 5-325 MG tablet Take 1 tablet by mouth 3 (three) times daily as needed for pain. 09/07/21   [provider]  PARoxetine  (PAXIL ) 30 MG tablet Take 30 mg by mouth daily. 09/19/23   [provider]  polyethylene glycol powder (GLYCOLAX /MIRALAX ) 17 GM/SCOOP powder Take 17 g by mouth daily as needed. Home med. 12/20/23   Awanda City, MD  PULMICORT  0.5 MG/2ML nebulizer solution Take 0.5 mg by nebulization every 12 (twelve) hours. Patient not taking: Reported on 03/19/2024 02/13/21   [provider]  roflumilast  (DALIRESP ) 500 MCG TABS tablet Take 1 tablet (500 mcg total) by mouth daily. 03/19/24   Kasa, Kurian, MD  terbinafine (LAMISIL) 1 % cream Apply 1 application  topically 2 (two) times daily. To affected skin, for 1 week after resolution of rash. # 30 gm, 1 refill. Patient not taking: Reported on 03/19/2024    [provider]  TRULICITY 4.5 MG/0.5ML SOPN Inject 4.5  mg into the skin once a week. 09/28/21   [provider]  Vitamin D , Ergocalciferol , (DRISDOL ) 1.25 MG (50000 UNIT) CAPS capsule Take 50,000 Units by mouth every 7 (seven) days.    [provider]  fluconazole  (DIFLUCAN ) 150 MG tablet Take 1 tablet day 1 repeat in 1 week 11/15/20   Moye, Virginia , MD  Scheduled Meds:  Chlorhexidine  Gluconate Cloth  6 each Topical Daily   enoxaparin  (LOVENOX ) injection  30 mg Subcutaneous Q12H   insulin  aspart  0-15 Units Subcutaneous TID WC   insulin  aspart  0-5 Units  Subcutaneous QHS   Continuous Infusions:  sodium chloride      vancomycin      PRN Meds:.docusate sodium , polyethylene glycol  Active Hospital Problem list   See systems below  Assessment & Plan:  #Acute on Chronic Hypoxic and Hypercapnic Respiratory Failure d/t COPD Exacerbation w/ potential PNA #Mucus Plugging vs Aspiration PMHx: COPD/Emphysema/Asthma with chronic tracheostomy and home vent. Pseudomonas and Acinetobacter PNA  -Continue Home vent Settings -Plateau pressures less than 30 cm H20 -Wean PEEP & FiO2 as tolerated, maintain SpO2 > 88% -Head of bed elevated 30 degrees, VAP protocol in place  -Intermittent chest x-ray & ABG PRN -Ensure adequate pulmonary hygiene  -Steroids initiated: Solu-medrol  40 mg BID (was given 125 mg in ER) -Budesonide  nebs BID, Duo Neb Q 6 hrs, 3% saline nebs Q 6 hrs, Albuterol  neb Q 6 hrs PRN, Guaifenesin  Q 6 hr (once cleared for diet) -Morphine  IV Q 3 hrs PRN for increased WOB  #Sepsis due to Suspected Pneumonia Initial interventions/workup included: 1 L of NS/LR & Cefepime , Vancomycin , Levaquin  -Monitor WBC/ fever curve -Cultures: BC x 2, Sputum, UA, Legionella and Strep Pneumoniae pending, RVP negative -trend Procalcitonin and Lactic  -continue empiric Antibiotics -CT chest if no improvement -SLP and PT consulted   #HFpEF #Hypertension #Hx: Hyperlipidemia, CAD -proBNP:99.1  -Chest XR: no evidence of cardiogenic pulmonary edema  -ECHO (10/25): LV function is preserved (EF60-65%). -Restart home GDMT as tolerated -troponin flat6 -Replete electrolytes (K >4, Mg >2, Phos >3) -Avoid fluid resuscitation -strict I/Os  #Anxiety/Depression -Continue home meds once Med Rec completed -Valium  2.5 mg IV Q 4 hours until able to take oral medications, patient prefers Klonopin     #Type 2 Diabetes Mellitus, insulin  dependent -Monitor CBG Q 4 hours -SSI dosing Q 4 hours -Resume home Lantus  pending med Rec   Best practice:  Diet:   NPO Pain/Anxiety/Delirium protocol (if indicated): Yes (RASS goal 0) VAP protocol (if indicated): Yes DVT prophylaxis: LMWH GI prophylaxis: PPI Glucose control:  SSI Yes and Basal insulin  Yes Central venous access:  N/A Arterial line:  N/A Foley:  N/A Mobility:  bed rest  PT/OT consulted: Yes Code Status:  full code Disposition: ICU   = Goals of Care =  Primary Emergency Contact: Sparrow,[name], Home Phone: [phone]   Critical care time: 55 minutes         Almarie Nose DNP, CCRN, FNP-C, AGACNP-BC Acute Care & Family Nurse Practitioner [facility] PCCM on call pager [phone]

## 2024-05-01 NOTE — Progress Notes (Signed)
 PHARMACIST - PHYSICIAN COMMUNICATION  CONCERNING:  Enoxaparin  (Lovenox ) for DVT Prophylaxis    RECOMMENDATION: Patient was prescribed enoxaprin 40mg  q24 hours for VTE prophylaxis.   Filed Weights   04/30/24 2324  Weight: 97.5 kg (215 lb)    Body mass index is 40.65 kg/m.  Estimated Creatinine Clearance: 67.8 mL/min (by C-G formula based on SCr of 0.92 mg/dL).   Based on [facility] policy patient is candidate for enoxaparin  0.5mg /kg TBW SQ every 24 hours based on BMI being >30.   DESCRIPTION: Pharmacy has adjusted enoxaparin  dose per [facility] policy.  Patient is now receiving enoxaparin  50 mg every 24 hours    Ransom Blanch PGY-1 Pharmacy Resident  [place] [facility]  05/01/2024 12:54 PM

## 2024-05-01 NOTE — Inpatient Diabetes Management (Signed)
 Inpatient Diabetes Program Recommendations  AACE/ADA: New Consensus Statement on Inpatient Glycemic Control (2015)  Target Ranges:  Prepandial:   less than 140 mg/dL      Peak postprandial:   less than 180 mg/dL (1-2 hours)      Critically ill patients:  140 - 180 mg/dL    Latest Reference Range & Units 05/01/24 02:42 05/01/24 07:32  Glucose-Capillary 70 - 99 mg/dL  125 mg Solumedrol @2342  305 (H)  4 units Novolog   402 (H)  15 units Novolog    (H): Data is abnormally high    Admit with:  Acute on Chronic Hypoxic and Hypercapnic Respiratory Failure d/t COPD Exacerbation w/ potential PNA Mucus Plugging vs Aspiration Sepsis due to Suspected Pneumonia   History: DM2  Home DM Meds: Lantus  40 units at HS       Novolog  5-10 units TID per SSI       Trulicity 4.5 mg Qweek  Current Orders: Novolog  Moderate Correction Scale/ SSI (0-15 units) TID AC + HS     MD- Note pt getting Solumedrol 40 mg BID (received 125 mg Solumedrol X 1 dose yest around MN)  CBG 402 this AM  Pt takes Lantus  at home  Please consider starting Semglee  40 units daily this AM (home dose)   --Will follow patient during hospitalization--  Adina Rudolpho Arrow RN, MSN, CDCES Diabetes Coordinator Inpatient Glycemic Control Team Team Pager: [phone] (8a-5p)

## 2024-05-01 NOTE — Progress Notes (Signed)
 PHARMACY CONSULT NOTE - ELECTROLYTES  Pharmacy Consult for Electrolyte Monitoring and Replacement   Recent Labs: Height: 5' 0.98 (154.9 cm) Weight: 97.5 kg (215 lb) IBW/kg (Calculated) : 47.76 Estimated Creatinine Clearance: 67.8 mL/min (by C-G formula based on SCr of 0.92 mg/dL). Potassium (mmol/L)  Date Value  05/01/2024 3.2 (L)  07/11/2014 4.6   Magnesium  (mg/dL)  Date Value  88/71/7974 1.7  10/31/2013 2.6 (H)   Calcium  (mg/dL)  Date Value  88/71/7974 9.1   Calcium , Total (mg/dL)  Date Value  97/92/7983 8.9   Albumin  (g/dL)  Date Value  88/72/7974 4.5  07/09/2014 3.7   Phosphorus (mg/dL)  Date Value  88/71/7974 2.5  10/31/2013 4.6   Sodium (mmol/L)  Date Value  05/01/2024 139  07/11/2014 139    Assessment  Michelle [name] is a 63 y.o. female presenting with acute on chronic respiratory failure. PMH significant for COPD, chronic trach, HTN, HLD, CHF, CAD s/p STEMI s/stent 2009, T2DM, Hepatitis C, anxiety/depression, obesity. Pharmacy has been consulted to monitor and replace electrolytes.  Diet: heart healthy/carb modified MIVF: None Pertinent medications: None  Goal of Therapy:  K > 4, Mg > 2 All other electrolytes WNL  11/28: K 3.2, Mg 1.7, Phos 2.5  Plan:  Kcl 10 mEq IV q1h x 4 ordered by provider this AM  Will give Mg sulfate 2 g IV x 1  Check BMP, Mg, Phos with AM labs  Thank you for allowing pharmacy to be a part of this patient's care.   Ransom Blanch PGY-1 Pharmacy Resident  [place] - [facility]  05/01/2024 10:00 AM

## 2024-05-01 NOTE — ED Notes (Signed)
 Patient repositioned and padded with pillows.

## 2024-05-02 LAB — URINE CULTURE: Culture: NO GROWTH

## 2024-05-02 LAB — CBC WITH DIFFERENTIAL/PLATELET
Abs Immature Granulocytes: 0.07 K/uL (ref 0.00–0.07)
Basophils Absolute: 0 K/uL (ref 0.0–0.1)
Basophils Relative: 0 %
Eosinophils Absolute: 0 K/uL (ref 0.0–0.5)
Eosinophils Relative: 0 %
HCT: 31.7 % — ABNORMAL LOW (ref 36.0–46.0)
Hemoglobin: 10 g/dL — ABNORMAL LOW (ref 12.0–15.0)
Immature Granulocytes: 1 %
Lymphocytes Relative: 10 %
Lymphs Abs: 1.1 K/uL (ref 0.7–4.0)
MCH: 25.8 pg — ABNORMAL LOW (ref 26.0–34.0)
MCHC: 31.5 g/dL (ref 30.0–36.0)
MCV: 81.7 fL (ref 80.0–100.0)
Monocytes Absolute: 0.9 K/uL (ref 0.1–1.0)
Monocytes Relative: 8 %
Neutro Abs: 8.8 K/uL — ABNORMAL HIGH (ref 1.7–7.7)
Neutrophils Relative %: 81 %
Platelets: 252 K/uL (ref 150–400)
RBC: 3.88 MIL/uL (ref 3.87–5.11)
RDW: 14 % (ref 11.5–15.5)
WBC: 11 K/uL — ABNORMAL HIGH (ref 4.0–10.5)
nRBC: 0 % (ref 0.0–0.2)

## 2024-05-02 LAB — BASIC METABOLIC PANEL WITH GFR
Anion gap: 11 (ref 5–15)
BUN: 16 mg/dL (ref 8–23)
CO2: 21 mmol/L — ABNORMAL LOW (ref 22–32)
Calcium: 9.4 mg/dL (ref 8.9–10.3)
Chloride: 108 mmol/L (ref 98–111)
Creatinine, Ser: 0.71 mg/dL (ref 0.44–1.00)
GFR, Estimated: >60 mL/min (ref >60)
Glucose, Bld: 247 mg/dL — ABNORMAL HIGH (ref 70–99)
Potassium: 4.3 mmol/L (ref 3.5–5.1)
Sodium: 140 mmol/L (ref 135–145)

## 2024-05-02 LAB — GLUCOSE, CAPILLARY
Glucose-Capillary: 201 mg/dL — ABNORMAL HIGH (ref 70–99)
Glucose-Capillary: 201 mg/dL — ABNORMAL HIGH (ref 70–99)
Glucose-Capillary: 285 mg/dL — ABNORMAL HIGH (ref 70–99)
Glucose-Capillary: 200 mg/dL — ABNORMAL HIGH (ref 70–99)

## 2024-05-02 LAB — PHOSPHORUS: Phosphorus: 1.7 mg/dL — ABNORMAL LOW (ref 2.5–4.6)

## 2024-05-02 LAB — LEGIONELLA PNEUMOPHILA SEROGP 1 UR AG: L. pneumophila Serogp 1 Ur Ag: NEGATIVE

## 2024-05-02 LAB — MAGNESIUM: Magnesium: 2.5 mg/dL — ABNORMAL HIGH (ref 1.7–2.4)

## 2024-05-02 MED ORDER — SALINE SPRAY 0.65 % NA SOLN
1.0000 | NASAL | Status: DC | PRN
  Administered 2024-05-03: 1 via NASAL
  Filled 2024-05-02: qty 44

## 2024-05-02 MED ORDER — SODIUM PHOSPHATES 45 MMOLE/15ML IV SOLN
30.0000 mmol | Freq: Once | INTRAVENOUS | Status: AC
  Administered 2024-05-02: 30 mmol via INTRAVENOUS
  Filled 2024-05-02: qty 10

## 2024-05-02 MED ORDER — PANTOPRAZOLE SODIUM 40 MG IV SOLR
40.0000 mg | Freq: Every day | INTRAVENOUS | Status: DC
  Administered 2024-05-02 – 2024-05-04 (×2): 40 mg via INTRAVENOUS
  Filled 2024-05-02 (×2): qty 10

## 2024-05-02 MED ORDER — IPRATROPIUM-ALBUTEROL 0.5-2.5 (3) MG/3ML IN SOLN
3.0000 mL | Freq: Three times a day (TID) | RESPIRATORY_TRACT | Status: DC
  Administered 2024-05-02 – 2024-05-06 (×12): 3 mL via RESPIRATORY_TRACT
  Filled 2024-05-02 (×12): qty 3

## 2024-05-02 NOTE — Plan of Care (Signed)

## 2024-05-02 NOTE — Progress Notes (Signed)
 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  Please do not hesitate to reach out to the hospital liaison for any palliative related questions or concerns.  Hospital liaison team will follow through hospitalization.  Saddie HILARIO Na, RN Nurse Liaison [phone]

## 2024-05-02 NOTE — Plan of Care (Signed)
  Problem: Education: Goal: Ability to describe self-care measures that may prevent or decrease complications (Diabetes Survival Skills Education) will improve Outcome: Progressing   Problem: Coping: Goal: Ability to adjust to condition or change in health will improve Outcome: Progressing   Problem: Nutritional: Goal: Maintenance of adequate nutrition will improve Outcome: Progressing   Problem: Education: Goal: Knowledge of General Education information will improve Description: Including pain rating scale, medication(s)/side effects and non-pharmacologic comfort measures Outcome: Progressing   Problem: Clinical Measurements: Goal: Respiratory complications will improve Outcome: Progressing Goal: Cardiovascular complication will be avoided Outcome: Progressing   Problem: Coping: Goal: Level of anxiety will decrease Outcome: Progressing

## 2024-05-02 NOTE — Progress Notes (Signed)
 PHARMACY CONSULT NOTE - ELECTROLYTES  Pharmacy Consult for Electrolyte Monitoring and Replacement   Recent Labs: Height: 5' 0.98 (154.9 cm) Weight: 69 kg (152 lb 1.9 oz) IBW/kg (Calculated) : 47.76 Estimated Creatinine Clearance: 64.8 mL/min (by C-G formula based on SCr of 0.71 mg/dL). Potassium (mmol/L)  Date Value  05/02/2024 4.3  07/11/2014 4.6   Magnesium  (mg/dL)  Date Value  88/70/7974 2.5 (H)  10/31/2013 2.6 (H)   Calcium  (mg/dL)  Date Value  88/70/7974 9.4   Calcium , Total (mg/dL)  Date Value  97/92/7983 8.9   Albumin  (g/dL)  Date Value  88/72/7974 4.5  07/09/2014 3.7   Phosphorus (mg/dL)  Date Value  88/70/7974 1.7 (L)  10/31/2013 4.6   Sodium (mmol/L)  Date Value  05/02/2024 140  07/11/2014 139    Assessment  Michelle [name] is a 63 y.o. female presenting with acute on chronic respiratory failure. PMH significant for COPD, chronic trach, HTN, HLD, CHF, CAD s/p STEMI s/stent 2009, T2DM, Hepatitis C, anxiety/depression, obesity. Pharmacy has been consulted to monitor and replace electrolytes.  Goal of Therapy:  Potassium 4.0 - 5.1 mmol/L Magnesium  2.0 - 2.4 mg/dL All other electrolytes WNL  Plan:  30 mmol iv sodium phosphate  x 1 Check BMP, Mg, Phos with AM labs  Thank you for allowing pharmacy to be a part of this patient's care.   Adriana Bolster, PharmD, BCPS [place] [facility]  05/02/2024 6:31 AM

## 2024-05-02 NOTE — H&P (Signed)
 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 Increased opacity in infrahilar areas, suggestive of atelectasis or aspiration changes.  Medications Administered: Cefepime  2g IV, Levofloxacin  750 mg IV, DuoNeb's, Valium  5mg  IV, Solumedrol 125mg  IV, 1L NS bolus. PCCM consulted for admission.  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 Cultures pending, broad spectrum antibiotics.   Consults:  None  Procedures:  None  Interim History / Subjective:      Micro Data:  11/27: SARS-CoV-2 PCR> negative 11/27: Influenza PCR> negative 11/28: Respiratory Viral Panel> 11/27: Blood culture x2> 11/28: Urine  Culture> 11/28: MRSA PCR>>  11/28: Strep pneumo Ur Ag 11/28: Legionella Ur Ag>  Antimicrobials:   Antibiotics Given (last 72 hours)     Date/Time Action Medication Dose Rate   04/30/24 2349 New Bag/Given   ceFEPIme  (MAXIPIME ) 2 g in sodium chloride  0.9 % 100 mL IVPB 2 g 200 mL/hr   05/01/24 0028 New Bag/Given   levofloxacin  (LEVAQUIN ) IVPB 750 mg 750 mg 100 mL/hr   05/01/24 0200 New Bag/Given   vancomycin  (VANCOREADY) IVPB 2000 mg/400 mL 2,000 mg 200 mL/hr   05/01/24 1455 New Bag/Given   ceFEPIme  (MAXIPIME ) 2 g in sodium chloride  0.9 % 100 mL IVPB 2 g 200 mL/hr   05/01/24 2057 New Bag/Given   ceFEPIme  (MAXIPIME ) 2 g in sodium chloride  0.9 % 100 mL IVPB 2 g 200 mL/hr   05/01/24 2157 New Bag/Given   azithromycin  (ZITHROMAX ) 500 mg in sodium chloride  0.9 % 250 mL IVPB 500 mg 250 mL/hr   05/02/24 0620 New Bag/Given   ceFEPIme  (MAXIPIME ) 2 g in sodium chloride  0.9 % 100 mL IVPB 2 g 200 mL/hr   05/02/24 1347 New Bag/Given   ceFEPIme  (MAXIPIME ) 2 g in sodium chloride  0.9 % 100 mL IVPB 2 g 200 mL/hr      OBJECTIVE  Blood pressure (!) 167/136, pulse (!) 144, temperature 99.6 F (37.6 C), temperature source Oral, resp. rate (!) 27, height 5' 0.98 (1.549 m), weight 97.5 kg, last menstrual period 03/06/2005, SpO2 99%.    Vent Mode: Other (Comment) Set Rate: [16 bmp] 16 bmp Vt Set: [400 mL] 400 mL PEEP: [5 cmH20] 5 cmH20 Pressure Support: [10 cmH20] 10 cmH20   Intake/Output Summary (Last 24 hours) at 05/01/2024 0028 Last data filed at 05/01/2024 0028 Gross per 24  hours  Intake 100 ml  Output --  Net 100 ml   Filed Weights   04/30/24 2324  Weight: 97.5 kg   S/P TRACH ON VENT +ANXIETY +RHONCHI ALERT AND AWAKE NO FOCAL DEFICITS  Labs/imaging that I have personally reviewed  (right click and Reselect all Smart List Selections daily)   DG Chest 1 View Result Date: 05/01/2024 EXAM: 1 VIEW(S) XRAY OF THE CHEST 04/30/2024 11:49:00 PM COMPARISON: Portable chest 03/11/2024.  CLINICAL HISTORY: SOB. FINDINGS: LINES, TUBES AND DEVICES: Tracheostomy cannula positioning is unchanged. Multiple overlying monitor wires. LUNGS AND PLEURA: Chronic left lateral basal sulcal blunting. Increased opacity of the infrahilar areas, which could reflect atelectasis or changes of recent aspiration. The remainder of the lungs is clear. No pleural effusion. No pneumothorax. HEART AND MEDIASTINUM: The heart size is normal. Stable mediastinum with aortic atherosclerosis. BONES AND SOFT TISSUES: No acute osseous abnormality. IMPRESSION: 1. Increased infrahilar opacities, which may represent atelectasis or recent aspiration changes. 2. Chronic left lateral basal sulcal blunting. Electronically signed by: Francis Quam MD 05/01/2024 12:03 AM EST RP Workstation: [id number]    Labs   CBC: Recent Labs  Lab 04/30/24 2326  WBC 10.0  NEUTROABS 6.2  HGB 12.2  HCT 39.9  MCV 84.4  PLT 303   Basic Metabolic Panel: Recent Labs  Lab 04/30/24 2326  NA 142  K 3.4*  CL 102  CO2 29  GLUCOSE 210*  BUN 10  CREATININE 0.86  CALCIUM  10.0   GFR: Estimated Creatinine Clearance: 72.5 mL/min (by C-G formula based on SCr of 0.86 mg/dL). Recent Labs  Lab 04/30/24 2326  WBC 10.0  LATICACIDVEN 2.4*   Liver Function Tests: Recent Labs  Lab 04/30/24 2326  AST 27  ALT 20  ALKPHOS 158*  BILITOT 0.3  PROT 8.9*  ALBUMIN  4.5   No results for input(s): LIPASE, AMYLASE in the last 168 hours. No results for input(s): AMMONIA in the last 168 hours.  ABG    Component Value Date/Time   PHART 7.38 03/12/2024 1053   PCO2ART 50 (H) 03/12/2024 1053   PO2ART 75 (L) 03/12/2024 1053   HCO3 29.6 (H) 03/12/2024 1053   ACIDBASEDEF 1.5 08/29/2023 0307   O2SAT 98 03/12/2024 1053     Coagulation Profile: No results for input(s): INR, PROTIME in the last 168 hours.  Cardiac Enzymes: No results for input(s): CKTOTAL, CKMB, CKMBINDEX, TROPONINI in the last 168 hours.  HbA1C: Hemoglobin  A1C  Date/Time Value Ref Range Status  06/29/2013 04:02 AM 6.0 4.2 - 6.3 % Final    Comment:    The American Diabetes Association recommends that a primary goal of therapy should be <7% and that physicians should reevaluate the treatment regimen in patients with HbA1c values consistently >8%.    Hgb A1c MFr Bld  Date/Time Value Ref Range Status  03/10/2024 04:17 AM 8.7 (H) 4.8 - 5.6 % Final    Comment:    (NOTE) Diagnosis of Diabetes The following HbA1c ranges recommended by the American Diabetes Association (ADA) may be used as an aid in the diagnosis of diabetes mellitus.  Hemoglobin             Suggested A1C NGSP%              Diagnosis  <5.7                   Non Diabetic  5.7-6.4                Pre-Diabetic  >  6.4                   Diabetic  <7.0                   Glycemic control for                       adults with diabetes.    08/29/2023 01:01 AM 9.4 (H) 4.8 - 5.6 % Final    Comment:    (NOTE) Pre diabetes:          5.7%-6.4%  Diabetes:              >6.4%  Glycemic control for   <7.0% adults with diabetes     Surgical History    Past Surgical History:  Procedure Laterality Date   CARDIAC CATHETERIZATION     CHEST TUBE INSERTION     IR GASTROSTOMY TUBE MOD SED  10/31/2017   TRACHEOSTOMY TUBE PLACEMENT N/A 10/17/2017   Procedure: TRACHEOSTOMY; Surgeon: Milissa Hamming, MD; Location: [facility] ORS; Service: ENT; Laterality: N/A;   Social History   reports that she quit smoking about 6 years ago. Her smoking use included cigarettes. She started smoking about 47 years ago. She has a 4.1 pack-year smoking history. She has never used smokeless tobacco. She reports that she does not currently use drugs after having used the following drugs: Crack cocaine. She reports that she does not drink alcohol.   Family History   Her family history includes Asthma in her mother; Breast cancer (age of onset: 44) in her maternal aunt; CAD in her father; COPD in her sister;  CVA in her father; Lung cancer in her mother; Stroke in her father.   Allergies Allergies  Allergen Reactions   Lorazepam      Makes anxiety worse  Other Reaction(s): Makes anxiety worse   Lisinopril  Cough   Amoxicillin      Patient has tolerated cephalosporins in the past     Home Medications  Prior to Admission medications   Medication Sig Start Date End Date Taking? Authorizing Provider  acetaminophen  (TYLENOL ) 325 MG tablet Take 2 tablets (650 mg total) by mouth every 6 (six) hours as needed for mild pain (pain score 1-3) (or Fever >/= 101). 04/24/23   Dorinda Drue DASEN, MD  albuterol  (PROVENTIL ) (2.5 MG/3ML) 0.083% nebulizer solution Take 3 mLs (2.5 mg total) by nebulization every 6 (six) hours as needed for wheezing or shortness of breath. 03/27/23   Tamea Dedra CROME, MD  albuterol  (VENTOLIN  HFA) [phone] Base) MCG/ACT inhaler Inhale 2 puffs into the lungs every 6 (six) hours as needed for wheezing or shortness of breath. 04/02/22   Parrett, Madelin RAMAN, NP  amLODipine  (NORVASC ) 5 MG tablet Take 5 mg by mouth daily. 08/27/21   [provider]  aspirin  81 MG chewable tablet Chew 81 mg by mouth daily.     [provider]  azelastine  (ASTELIN ) 0.1 % nasal spray SMARTSIG:1-2 Spray(s) Both Nares Every 12 Hours PRN 09/22/20   [provider]  azithromycin  (ZITHROMAX  Z-PAK) 250 MG tablet Take 500 mg by mouth daily for one day followed by 250 mg daily by mouth for four days 03/12/24   Shellia Inge BIRCH, NP  Blood Glucose Monitoring Suppl ([employer] BLOOD GLUCOSE METER) w/Device KIT Use as directed to monitor blood glucose up to qid 02/22/23   Alexander, Natalie, DO  cetirizine  (ZYRTEC ) 10 MG tablet Take 1 tablet (10 mg total) by mouth daily  as needed for allergies. Home med. 12/20/23   Awanda City, MD  clonazePAM  (KLONOPIN ) 0.5 MG tablet Take 0.5 mg by mouth in the morning, at noon, and at bedtime.    [provider]  cloNIDine  (CATAPRES  - DOSED IN MG/24 HR) 0.1 mg/24hr patch  Place 0.1 mg onto the skin once a week.    [provider]  docusate sodium  (COLACE) 100 MG capsule Take 2 capsules (200 mg total) by mouth daily as needed for mild constipation. 04/24/23   Dorinda Drue DASEN, MD  famotidine  (PEPCID ) 20 MG tablet Take 1 tablet by mouth daily as needed for heartburn 09/24/23   [provider]  fluconazole  (DIFLUCAN ) 150 MG tablet Take 1 tablet (150 mg total) by mouth once a week. 03/02/24   Brimage, Vondra, DO  fluticasone  (FLONASE ) 50 MCG/ACT nasal spray Place 1 spray into both nostrils daily as needed for allergies or rhinitis. Home med. 12/20/23   Awanda City, MD  guaiFENesin  (MUCINEX ) 600 MG 12 hr tablet Take 600 mg by mouth 2 (two) times daily.    [provider]  hydrOXYzine  (VISTARIL ) 25 MG capsule Take 25 mg by mouth every 6 (six) hours as needed. 10/01/22   [provider]  ipratropium-albuterol  (DUONEB) 0.5-2.5 (3) MG/3ML SOLN Take 3 mLs by nebulization every 6 (six) hours as needed (for shortness of breath). 06/17/23   [name], MD  LANTUS  SOLOSTAR 100 UNIT/ML Solostar Pen Inject 40 Units into the skin at bedtime. 01/03/23   [provider]  mirtazapine  (REMERON ) 45 MG tablet Take 45 mg by mouth at bedtime. 01/07/24   [provider]  Multiple Vitamin (MULTIVITAMIN WITH MINERALS) TABS tablet Take 1 tablet by mouth daily. 12/20/23   Awanda City, MD  naloxone  (NARCAN ) nasal spray 4 mg/0.1 mL Place 1 spray into the nose once as needed for up to 1 dose. Home med. 12/20/23   Awanda City, MD  NOVOLOG  FLEXPEN 100 UNIT/ML FlexPen Inject 5-10 Units into the skin 3 (three) times daily with meals. Sliding scale mealtime insulin . 03/01/21   [provider]  nystatin  ointment (MYCOSTATIN ) Apply 1 Application topically 3 (three) times daily as needed. Home med. 12/20/23   Awanda City, MD  oxyCODONE -acetaminophen  (PERCOCET/ROXICET) 5-325 MG tablet Take 1 tablet by mouth 3 (three) times daily as needed for pain. 09/07/21   [provider]  PARoxetine  (PAXIL ) 30 MG tablet Take 30 mg by mouth daily. 09/19/23   [provider]  polyethylene glycol powder (GLYCOLAX /MIRALAX ) 17 GM/SCOOP powder Take 17 g by mouth daily as needed. Home med. 12/20/23   Awanda City, MD  PULMICORT  0.5 MG/2ML nebulizer solution Take 0.5 mg by nebulization every 12 (twelve) hours. Patient not taking: Reported on 03/19/2024 02/13/21   [provider]  roflumilast  (DALIRESP ) 500 MCG TABS tablet Take 1 tablet (500 mcg total) by mouth daily. 03/19/24   Asuncion Shibata, MD  terbinafine (LAMISIL) 1 % cream Apply 1 application  topically 2 (two) times daily. To affected skin, for 1 week after resolution of rash. # 30 gm, 1 refill. Patient not taking: Reported on 03/19/2024    [provider]  TRULICITY 4.5 MG/0.5ML SOPN Inject 4.5 mg into the skin once a week. 09/28/21   [provider]  Vitamin D , Ergocalciferol , (DRISDOL ) 1.25 MG (50000 UNIT) CAPS capsule Take 50,000 Units by mouth every 7 (seven) days.    [provider]  fluconazole  (DIFLUCAN ) 150 MG tablet Take 1 tablet day 1 repeat in 1 week 11/15/20  Moye, Virginia , MD  Scheduled Meds:  amLODipine   5 mg Oral Daily   aspirin   81 mg Oral Daily   budesonide  (PULMICORT ) nebulizer solution  0.25 mg Nebulization BID   Chlorhexidine  Gluconate Cloth  6 each Topical Daily   clonazepam   0.5 mg Oral TID   enoxaparin  (LOVENOX ) injection  0.5 mg/kg Subcutaneous Q24H   insulin  aspart  0-20 Units Subcutaneous TID WC   insulin  aspart  0-5 Units Subcutaneous QHS   insulin  glargine-yfgn  20 Units Subcutaneous BID   ipratropium-albuterol   3 mL Nebulization TID   methylPREDNISolone  (SOLU-MEDROL ) injection  40 mg Intravenous Daily   mirtazapine   45 mg Oral QHS   multivitamin with minerals  1 tablet Oral Daily   mouth rinse  15 mL Mouth Rinse Q2H   pantoprazole  (PROTONIX ) IV  40 mg Intravenous QHS   PARoxetine   30 mg Oral Daily   Continuous Infusions:  azithromycin  500  mg (05/01/24 2157)   ceFEPime  (MAXIPIME ) IV 2 g (05/02/24 1347)   PRN Meds:.docusate sodium , hydrOXYzine , ipratropium-albuterol , morphine  injection, mouth rinse, oxyCODONE -acetaminophen , polyethylene glycol  Active Hospital Problem list   See systems below  Assessment & Plan:  Acute on Chronic Hypoxic and Hypercapnic Respiratory Failure d/t COPD Exacerbation w/ potential PNA Mucus Plugging vs Aspiration PMHx: COPD/Emphysema/Asthma with chronic tracheostomy and home vent. Pseudomonas and Acinetobacter PNA  -Continue Home vent Settings -Wean PEEP & FiO2 as tolerated, maintain SpO2 > 88% -Intermittent chest x-ray & ABG PRN -Ensure adequate pulmonary hygiene  -Steroids initiated: Solu-medrol  40 mg BID (was given 125 mg in ER) -Budesonide  nebs BID, Duo Neb Q 6 hrs, 3% saline nebs Q 6 hrs, Albuterol  neb Q 6 hrs PRN, Guaifenesin  Q 6 hr (once cleared for diet) -Morphine  IV Q 3 hrs PRN for increased WOB  Sepsis due to Suspected Pneumonia Initial interventions/workup included: 1 L of NS/LR & Cefepime , Vancomycin , Levaquin  -Monitor WBC/ fever curve -Cultures: BC x 2, Sputum, UA, Legionella and Strep Pneumoniae pending, RVP negative -trend Procalcitonin and Lactic     Best practice:  Diet:  NPO Pain/Anxiety/Delirium protocol (if indicated): Yes (RASS goal 0) VAP protocol (if indicated): Yes DVT prophylaxis: LMWH GI prophylaxis: PPI Glucose control:  SSI Yes and Basal insulin  Yes Central venous access:  N/A Arterial line:  N/A Foley:  N/A Mobility:  bed rest  PT/OT consulted: Yes Code Status:  full code Disposition: ICU        Critical Care Time devoted to patient care services described in this note is 45 minutes.  Critical care was necessary to treat /prevent imminent and life-threatening deterioration.   Nickolas Alm Cellar, M.D.  [facility]  Medical Director [facility]

## 2024-05-03 LAB — GLUCOSE, CAPILLARY
Glucose-Capillary: 135 mg/dL — ABNORMAL HIGH (ref 70–99)
Glucose-Capillary: 155 mg/dL — ABNORMAL HIGH (ref 70–99)
Glucose-Capillary: 320 mg/dL — ABNORMAL HIGH (ref 70–99)
Glucose-Capillary: 198 mg/dL — ABNORMAL HIGH (ref 70–99)

## 2024-05-03 LAB — CBC
HCT: 34.4 % — ABNORMAL LOW (ref 36.0–46.0)
Hemoglobin: 10.8 g/dL — ABNORMAL LOW (ref 12.0–15.0)
MCH: 25.8 pg — ABNORMAL LOW (ref 26.0–34.0)
MCHC: 31.4 g/dL (ref 30.0–36.0)
MCV: 82.1 fL (ref 80.0–100.0)
Platelets: 288 K/uL (ref 150–400)
RBC: 4.19 MIL/uL (ref 3.87–5.11)
RDW: 14.3 % (ref 11.5–15.5)
WBC: 9.5 K/uL (ref 4.0–10.5)
nRBC: 0 % (ref 0.0–0.2)

## 2024-05-03 LAB — RENAL FUNCTION PANEL
Albumin: 3.9 g/dL (ref 3.5–5.0)
Anion gap: 11 (ref 5–15)
BUN: 19 mg/dL (ref 8–23)
CO2: 25 mmol/L (ref 22–32)
Calcium: 9.7 mg/dL (ref 8.9–10.3)
Chloride: 107 mmol/L (ref 98–111)
Creatinine, Ser: 0.71 mg/dL (ref 0.44–1.00)
GFR, Estimated: >60 mL/min (ref >60)
Glucose, Bld: 163 mg/dL — ABNORMAL HIGH (ref 70–99)
Phosphorus: 2.6 mg/dL (ref 2.5–4.6)
Potassium: 3.9 mmol/L (ref 3.5–5.1)
Sodium: 142 mmol/L (ref 135–145)

## 2024-05-03 LAB — MAGNESIUM: Magnesium: 2.2 mg/dL (ref 1.7–2.4)

## 2024-05-03 MED ORDER — OXYCODONE HCL 5 MG PO TABS
5.0000 mg | ORAL_TABLET | Freq: Once | ORAL | Status: AC
  Administered 2024-05-03: 5 mg via ORAL
  Filled 2024-05-03 (×2): qty 1

## 2024-05-03 NOTE — Plan of Care (Signed)
  Problem: Education: Goal: Ability to describe self-care measures that may prevent or decrease complications (Diabetes Survival Skills Education) will improve Outcome: Progressing   Problem: Education: Goal: Knowledge of General Education information will improve Description: Including pain rating scale, medication(s)/side effects and non-pharmacologic comfort measures Outcome: Progressing   Problem: Clinical Measurements: Goal: Ability to maintain clinical measurements within normal limits will improve Outcome: Progressing Goal: Respiratory complications will improve Outcome: Progressing   Problem: Activity: Goal: Risk for activity intolerance will decrease Outcome: Progressing

## 2024-05-03 NOTE — Plan of Care (Signed)
   Problem: Education: Goal: Ability to describe self-care measures that may prevent or decrease complications (Diabetes Survival Skills Education) will improve Outcome: Progressing Goal: Individualized Educational Video(s) Outcome: Progressing   Problem: Coping: Goal: Ability to adjust to condition or change in health will improve Outcome: Progressing

## 2024-05-03 NOTE — Progress Notes (Signed)
 PHARMACY CONSULT NOTE - ELECTROLYTES  Pharmacy Consult for Electrolyte Monitoring and Replacement   Recent Labs: Height: 5' 0.98 (154.9 cm) Weight: 71.4 kg (157 lb 6.5 oz) IBW/kg (Calculated) : 47.76 Estimated Creatinine Clearance: 65.8 mL/min (by C-G formula based on SCr of 0.71 mg/dL). Potassium (mmol/L)  Date Value  05/03/2024 3.9  07/11/2014 4.6   Magnesium  (mg/dL)  Date Value  88/69/7974 2.2  10/31/2013 2.6 (H)   Calcium  (mg/dL)  Date Value  88/69/7974 9.7   Calcium , Total (mg/dL)  Date Value  97/92/7983 8.9   Albumin  (g/dL)  Date Value  88/69/7974 3.9  07/09/2014 3.7   Phosphorus (mg/dL)  Date Value  88/69/7974 2.6  10/31/2013 4.6   Sodium (mmol/L)  Date Value  05/03/2024 142  07/11/2014 139    Assessment  Michelle [name] is a 63 y.o. female presenting with acute on chronic respiratory failure. PMH significant for COPD, chronic trach, HTN, HLD, CHF, CAD s/p STEMI s/stent 2009, T2DM, Hepatitis C, anxiety/depression, obesity. Pharmacy has been consulted to monitor and replace electrolytes.  Goal of Therapy:  Potassium 4.0 - 5.1 mmol/L Magnesium  2.0 - 2.4 mg/dL All other electrolytes WNL  Plan:  No electrolyte replacement warranted for today Check BMP, Mg, Phos with AM labs  Thank you for allowing pharmacy to be a part of this patient's care.   Adriana Bolster, PharmD, BCPS [facility]  05/03/2024 7:08 AM

## 2024-05-04 ENCOUNTER — Encounter: Payer: Self-pay | Admitting: Internal Medicine

## 2024-05-04 LAB — RESPIRATORY PANEL BY PCR

## 2024-05-04 LAB — BASIC METABOLIC PANEL WITH GFR
Anion gap: 13 (ref 5–15)
BUN: 21 mg/dL (ref 8–23)
CO2: 22 mmol/L (ref 22–32)
Calcium: 9.1 mg/dL (ref 8.9–10.3)
Chloride: 107 mmol/L (ref 98–111)
Creatinine, Ser: 0.69 mg/dL (ref 0.44–1.00)
GFR, Estimated: >60 mL/min (ref >60)
Glucose, Bld: 180 mg/dL — ABNORMAL HIGH (ref 70–99)
Potassium: 3.9 mmol/L (ref 3.5–5.1)
Sodium: 142 mmol/L (ref 135–145)

## 2024-05-04 LAB — MAGNESIUM: Magnesium: 2.2 mg/dL (ref 1.7–2.4)

## 2024-05-04 LAB — GLUCOSE, CAPILLARY
Glucose-Capillary: 125 mg/dL — ABNORMAL HIGH (ref 70–99)
Glucose-Capillary: 172 mg/dL — ABNORMAL HIGH (ref 70–99)
Glucose-Capillary: 229 mg/dL — ABNORMAL HIGH (ref 70–99)
Glucose-Capillary: 229 mg/dL — ABNORMAL HIGH (ref 70–99)

## 2024-05-04 LAB — CBC
HCT: 38.1 % (ref 36.0–46.0)
Hemoglobin: 11.6 g/dL — ABNORMAL LOW (ref 12.0–15.0)
MCH: 25.5 pg — ABNORMAL LOW (ref 26.0–34.0)
MCHC: 30.4 g/dL (ref 30.0–36.0)
MCV: 83.7 fL (ref 80.0–100.0)
Platelets: 334 K/uL (ref 150–400)
RBC: 4.55 MIL/uL (ref 3.87–5.11)
RDW: 14.1 % (ref 11.5–15.5)
WBC: 11.9 K/uL — ABNORMAL HIGH (ref 4.0–10.5)
nRBC: 0 % (ref 0.0–0.2)

## 2024-05-04 LAB — LACTIC ACID, PLASMA: Lactic Acid, Venous: 1.6 mmol/L (ref 0.5–1.9)

## 2024-05-04 MED ORDER — ENOXAPARIN SODIUM 40 MG/0.4ML IJ SOSY
40.0000 mg | PREFILLED_SYRINGE | INTRAMUSCULAR | Status: DC
  Administered 2024-05-04 – 2024-05-05 (×2): 40 mg via SUBCUTANEOUS
  Filled 2024-05-04 (×2): qty 0.4

## 2024-05-04 MED ORDER — PANTOPRAZOLE SODIUM 40 MG PO TBEC
40.0000 mg | DELAYED_RELEASE_TABLET | Freq: Every day | ORAL | Status: DC
  Administered 2024-05-05 – 2024-05-06 (×2): 40 mg via ORAL
  Filled 2024-05-04 (×2): qty 1

## 2024-05-04 NOTE — Plan of Care (Signed)

## 2024-05-04 NOTE — Progress Notes (Signed)
 PHARMACY CONSULT NOTE - ELECTROLYTES  Pharmacy Consult for Electrolyte Monitoring and Replacement   Recent Labs: Height: 5' 0.98 (154.9 cm) Weight: 71.4 kg (157 lb 6.5 oz) IBW/kg (Calculated) : 47.76 Estimated Creatinine Clearance: 65.8 mL/min (by C-G formula based on SCr of 0.69 mg/dL). Potassium (mmol/L)  Date Value  05/04/2024 3.9  07/11/2014 4.6   Magnesium  (mg/dL)  Date Value  87/98/7974 2.2  10/31/2013 2.6 (H)   Calcium  (mg/dL)  Date Value  87/98/7974 9.1   Calcium , Total (mg/dL)  Date Value  97/92/7983 8.9   Albumin  (g/dL)  Date Value  88/69/7974 3.9  07/09/2014 3.7   Phosphorus (mg/dL)  Date Value  88/69/7974 2.6  10/31/2013 4.6   Sodium (mmol/L)  Date Value  05/04/2024 142  07/11/2014 139   Assessment  Michelle [name] is a 63 y.o. female presenting with acute on chronic respiratory failure. PMH significant for COPD, chronic trach, HTN, HLD, CHF, CAD s/p STEMI s/stent 2009, T2DM, Hepatitis C, anxiety/depression, obesity. Pharmacy has been consulted to monitor and replace electrolytes.  Goal of Therapy:  Potassium 4.0 - 5.1 mmol/L Magnesium  2.0 - 2.4 mg/dL All other electrolytes WNL  Plan:  Electrolytes are stable. Will sign off on consult  Thank you for allowing pharmacy to be a part of this patient's care.   [name] 05/04/2024 7:59 AM

## 2024-05-04 NOTE — Progress Notes (Signed)
 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, HTN, HLD, CHF, CAD s/p STEMI s/ stent 2009, DM type 2 w/ insulin  dependence, Hepatitis C, Former tobacco and cocaine use, Anxiety/Depression, and Obesity who presented to the ED with chief complaints of progressive shortness of breath  ED Course: Initial vital signs showed HR: 144 bpm, BP: 167/136 mmHg, RR: 30 breaths/min, Temp: 99.23F (37.6C), SpO2: 99%,patient was placed on the vent with Respiratory Support: Ventilator settings: Mode: PRVC, Set Rate: 16 bpm, Vt: 400 mL, PEEP: 5 cm H2O, Pressure Support: 10 cm H2O  Labs/Diagnostics: CBC: WBC: 10.0, Hgb: 12.2, Hct: 39.9,PLT: 303 Basic Metabolic Panel: Na+: 142, K+: 3.4 (low), Cl-: 102, CO2: 29, Glucose: 210 (high), BUN: 10, Creatinine: 0.86, Calcium : 10.0 Lactate: 2.4  ABG: Pending results for pO2, pCO2, pH, HCO3 Chest X-ray: Chronic left lateral basal sulcal blunting. Increased opacity in infrahilar areas, suggestive of atelectasis or aspiration changes.  Medications Administered: Cefepime  2g IV, Levofloxacin  750 mg IV, DuoNeb's, Valium  5mg  IV, Solumedrol 125mg  IV, 1L NS bolus. PCCM consulted for admission.  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 Cultures pending, broad spectrum antibiotics.   Interim History / Subjective:      Micro Data:  11/27: SARS-CoV-2 PCR> negative 11/27: Influenza PCR> negative 11/27: Blood culture x1> NGTD 11/28: Urine Culture>negative  11/28: MRSA PCR>>negative  11/28: Strep pneumo Ur Ag>>negative  11/28: Legionella Ur Ag>>negative  12/01: Respiratory Viral Panel>  Antimicrobials:   Antibiotics Given (last 72 hours)     Date/Time Action Medication Dose Rate   05/01/24 1455 New Bag/Given   ceFEPIme  (MAXIPIME ) 2 g in sodium chloride  0.9 % 100 mL IVPB 2 g 200 mL/hr   05/01/24 2057 New Bag/Given   ceFEPIme  (MAXIPIME ) 2 g in sodium chloride  0.9 % 100 mL IVPB 2 g 200 mL/hr   05/01/24 2157 New Bag/Given   azithromycin  (ZITHROMAX ) 500 mg in sodium chloride  0.9 % 250 mL IVPB 500 mg 250 mL/hr   05/02/24 0620 New Bag/Given   ceFEPIme  (MAXIPIME ) 2 g in sodium chloride  0.9 % 100 mL IVPB 2 g 200 mL/hr   05/02/24 1347 New Bag/Given   ceFEPIme  (MAXIPIME ) 2 g in sodium chloride  0.9 % 100 mL IVPB 2 g 200 mL/hr   05/02/24 2206 New Bag/Given   azithromycin  (ZITHROMAX ) 500 mg in sodium chloride  0.9 % 250 mL IVPB 500 mg 250 mL/hr   05/03/24 0031 New Bag/Given   ceFEPIme  (MAXIPIME ) 2 g in sodium chloride  0.9 % 100 mL IVPB 2 g 200 mL/hr   05/03/24 0646 New Bag/Given   ceFEPIme  (MAXIPIME ) 2 g in sodium chloride  0.9 % 100 mL IVPB 2 g 200 mL/hr   05/03/24 1421 New Bag/Given   ceFEPIme  (MAXIPIME ) 2 g in sodium chloride  0.9 % 100 mL IVPB 2 g 200 mL/hr   05/04/24 0028 New Bag/Given  ceFEPIme  (MAXIPIME ) 2 g in sodium chloride  0.9 % 100 mL IVPB 2 g 200 mL/hr   05/04/24 0113 New Bag/Given   azithromycin  (ZITHROMAX ) 500 mg in sodium chloride  0.9 % 250 mL IVPB 500 mg 250 mL/hr   05/04/24 0807 New Bag/Given   ceFEPIme  (MAXIPIME ) 2 g in sodium chloride  0.9 % 100 mL IVPB 2 g 200 mL/hr      OBJECTIVE  Blood pressure (!) 167/136, pulse (!) 144, temperature 99.6 F (37.6 C), temperature source Oral,  resp. rate (!) 27, height 5' 0.98 (1.549 m), weight 97.5 kg, last menstrual period 03/06/2005, SpO2 99%.    Vent Mode: Other (Comment) Set Rate: [16 bmp] 16 bmp Vt Set: [400 mL] 400 mL PEEP: [5 cmH20] 5 cmH20 Pressure Support: [10 cmH20] 10 cmH20   Intake/Output Summary (Last 24 hours) at 05/01/2024 0028 Last data filed at 05/01/2024 0028 Gross per 24 hours  Intake 100 ml  Output --  Net 100 ml   Filed Weights   04/30/24 2324  Weight: 97.5 kg   Physical Examination  GEN: Chronically-ill appearing female, NAD on home vent via chronic tracheostomy  HEENT: Supple, difficult to assess for JVD due to body habitus  HEART: NSR, s1s2, no m/r/g, 2+ radial/2+ distal pulses, no edema   LUNGS: Faint rhonchi throughout, even, non labored  ABDOMINAL: +BS x4, obese, non tender, non distended  EXTREMITIES: Normal bulk and tone, moves all extremities  NEURO: Awake and following commands, PERRLA SKIN: Intact, warm, no rashes lesion, or ulcer  Labs/imaging that I have personally reviewed  (right click and Reselect all Smart List Selections daily)   DG Chest 1 View Result Date: 05/01/2024 EXAM: 1 VIEW(S) XRAY OF THE CHEST 04/30/2024 11:49:00 PM COMPARISON: Portable chest 03/11/2024. CLINICAL HISTORY: SOB. FINDINGS: LINES, TUBES AND DEVICES: Tracheostomy cannula positioning is unchanged. Multiple overlying monitor wires. LUNGS AND PLEURA: Chronic left lateral basal sulcal blunting. Increased opacity of the infrahilar areas, which could reflect atelectasis or changes of recent aspiration. The remainder of the lungs is clear. No pleural effusion. No pneumothorax. HEART AND MEDIASTINUM: The heart size is normal. Stable mediastinum with aortic atherosclerosis. BONES AND SOFT TISSUES: No acute osseous abnormality. IMPRESSION: 1. Increased infrahilar opacities, which may represent atelectasis or recent aspiration changes. 2. Chronic left lateral basal sulcal blunting. Electronically signed by: Francis Quam MD 05/01/2024 12:03 AM EST RP Workstation: [id number]    Labs   CBC: Recent Labs  Lab 04/30/24 2326  WBC 10.0  NEUTROABS 6.2  HGB 12.2  HCT 39.9  MCV 84.4  PLT 303   Basic Metabolic Panel: Recent Labs  Lab 04/30/24 2326  NA 142  K 3.4*  CL 102  CO2 29  GLUCOSE 210*  BUN 10  CREATININE 0.86  CALCIUM  10.0   GFR: Estimated Creatinine Clearance: 72.5 mL/min (by C-G formula based on SCr of 0.86 mg/dL). Recent Labs  Lab 04/30/24 2326  WBC 10.0  LATICACIDVEN 2.4*   Liver Function Tests: Recent Labs  Lab 04/30/24 2326  AST 27  ALT 20  ALKPHOS 158*  BILITOT 0.3  PROT 8.9*  ALBUMIN  4.5   No results for input(s): LIPASE, AMYLASE in the last 168 hours. No results for input(s): AMMONIA in the last 168 hours.  ABG    Component Value Date/Time   PHART 7.38 03/12/2024 1053   PCO2ART 50 (H) 03/12/2024 1053   PO2ART 75 (L) 03/12/2024 1053   HCO3 29.6 (H) 03/12/2024 1053   ACIDBASEDEF 1.5 08/29/2023 0307   O2SAT 98  03/12/2024 1053     Coagulation Profile: No results for input(s): INR, PROTIME in the last 168 hours.  Cardiac Enzymes: No results for input(s): CKTOTAL, CKMB, CKMBINDEX, TROPONINI in the last 168 hours.  HbA1C: Hemoglobin A1C  Date/Time Value Ref Range Status  06/29/2013 04:02 AM 6.0 4.2 - 6.3 % Final    Comment:    The American Diabetes Association recommends that a primary goal of therapy should be <7% and that physicians should reevaluate the treatment regimen in patients with HbA1c values consistently >8%.    Hgb A1c MFr Bld  Date/Time Value Ref Range Status  03/10/2024 04:17 AM 8.7 (H) 4.8 - 5.6 % Final    Comment:    (NOTE) Diagnosis of Diabetes The following HbA1c ranges recommended by the American Diabetes Association (ADA) may be used as an aid in the diagnosis of diabetes mellitus.  Hemoglobin             Suggested A1C NGSP%              Diagnosis  <5.7                   Non Diabetic  5.7-6.4                 Pre-Diabetic  >6.4                   Diabetic  <7.0                   Glycemic control for                       adults with diabetes.    08/29/2023 01:01 AM 9.4 (H) 4.8 - 5.6 % Final    Comment:    (NOTE) Pre diabetes:          5.7%-6.4%  Diabetes:              >6.4%  Glycemic control for   <7.0% adults with diabetes     CBG: No results for input(s): GLUCAP in the last 168 hours.  Review of Systems:   Unable to be obtained secondary to the patient's intubated and sedated status.   Past Medical History  She, has a past medical history of Allergy, Anxiety, Asthma, CHF (congestive heart failure) (HCC), Cocaine abuse (HCC), COPD (chronic obstructive pulmonary disease) (HCC), Coronary artery disease, Emphysema/COPD, Hepatitis C, High cholesterol, Hypertension, Pneumothorax, and Tobacco abuse.   Surgical History    Past Surgical History:  Procedure Laterality Date   CARDIAC CATHETERIZATION     CHEST TUBE INSERTION     IR GASTROSTOMY TUBE MOD SED  10/31/2017   TRACHEOSTOMY TUBE PLACEMENT N/A 10/17/2017   Procedure: TRACHEOSTOMY; Surgeon: Milissa Hamming, MD; Location: [facility] ORS; Service: ENT; Laterality: N/A;   Social History   reports that she quit smoking about 6 years ago. Her smoking use included cigarettes. She started smoking about 47 years ago. She has a 4.1 pack-year smoking history. She has never used smokeless tobacco. She reports that she does not currently use drugs after having used the following drugs: Crack cocaine. She reports that she does not drink alcohol.   Family History   Her family history includes Asthma in her mother; Breast cancer (age of onset: 59) in her maternal aunt; CAD in her father; COPD in her sister; CVA in her father; Lung cancer in her mother; Stroke in her father.   Allergies Allergies  Allergen Reactions  Lorazepam      Makes anxiety worse  Other Reaction(s): Makes anxiety worse   Lisinopril  Cough   Amoxicillin       Patient has tolerated cephalosporins in the past     Home Medications  Prior to Admission medications   Medication Sig Start Date End Date Taking? Authorizing Provider  acetaminophen  (TYLENOL ) 325 MG tablet Take 2 tablets (650 mg total) by mouth every 6 (six) hours as needed for mild pain (pain score 1-3) (or Fever >/= 101). 04/24/23   Dorinda Drue DASEN, MD  albuterol  (PROVENTIL ) (2.5 MG/3ML) 0.083% nebulizer solution Take 3 mLs (2.5 mg total) by nebulization every 6 (six) hours as needed for wheezing or shortness of breath. 03/27/23   Tamea Dedra CROME, MD  albuterol  (VENTOLIN  HFA) [phone] Base) MCG/ACT inhaler Inhale 2 puffs into the lungs every 6 (six) hours as needed for wheezing or shortness of breath. 04/02/22   Parrett, Madelin RAMAN, NP  amLODipine  (NORVASC ) 5 MG tablet Take 5 mg by mouth daily. 08/27/21   [provider]  aspirin  81 MG chewable tablet Chew 81 mg by mouth daily.     [provider]  azelastine  (ASTELIN ) 0.1 % nasal spray SMARTSIG:1-2 Spray(s) Both Nares Every 12 Hours PRN 09/22/20   [provider]  azithromycin  (ZITHROMAX  Z-PAK) 250 MG tablet Take 500 mg by mouth daily for one day followed by 250 mg daily by mouth for four days 03/12/24   Shellia Inge BIRCH, NP  Blood Glucose Monitoring Suppl ([employer] BLOOD GLUCOSE METER) w/Device KIT Use as directed to monitor blood glucose up to qid 02/22/23   Alexander, Natalie, DO  cetirizine  (ZYRTEC ) 10 MG tablet Take 1 tablet (10 mg total) by mouth daily as needed for allergies. Home med. 12/20/23   Awanda City, MD  clonazePAM  (KLONOPIN ) 0.5 MG tablet Take 0.5 mg by mouth in the morning, at noon, and at bedtime.    [provider]  cloNIDine  (CATAPRES  - DOSED IN MG/24 HR) 0.1 mg/24hr patch Place 0.1 mg onto the skin once a week.    [provider]  docusate sodium  (COLACE) 100 MG capsule Take 2 capsules (200 mg total) by mouth daily as needed for mild constipation. 04/24/23   Dorinda Drue DASEN, MD  famotidine   (PEPCID ) 20 MG tablet Take 1 tablet by mouth daily as needed for heartburn 09/24/23   [provider]  fluconazole  (DIFLUCAN ) 150 MG tablet Take 1 tablet (150 mg total) by mouth once a week. 03/02/24   Brimage, Vondra, DO  fluticasone  (FLONASE ) 50 MCG/ACT nasal spray Place 1 spray into both nostrils daily as needed for allergies or rhinitis. Home med. 12/20/23   Awanda City, MD  guaiFENesin  (MUCINEX ) 600 MG 12 hr tablet Take 600 mg by mouth 2 (two) times daily.    [provider]  hydrOXYzine  (VISTARIL ) 25 MG capsule Take 25 mg by mouth every 6 (six) hours as needed. 10/01/22   [provider]  ipratropium-albuterol  (DUONEB) 0.5-2.5 (3) MG/3ML SOLN Take 3 mLs by nebulization every 6 (six) hours as needed (for shortness of breath). 06/17/23   Kasa, Kurian, MD  LANTUS  SOLOSTAR 100 UNIT/ML Solostar Pen Inject 40 Units into the skin at bedtime. 01/03/23   [provider]  mirtazapine  (REMERON ) 45 MG tablet Take 45 mg by mouth at bedtime. 01/07/24   [provider]  Multiple Vitamin (MULTIVITAMIN WITH MINERALS) TABS tablet Take 1 tablet by mouth daily. 12/20/23   Awanda City, MD  naloxone  (NARCAN ) nasal spray 4 mg/0.1  mL Place 1 spray into the nose once as needed for up to 1 dose. Home med. 12/20/23   Awanda City, MD  NOVOLOG  FLEXPEN 100 UNIT/ML FlexPen Inject 5-10 Units into the skin 3 (three) times daily with meals. Sliding scale mealtime insulin . 03/01/21   [provider]  nystatin  ointment (MYCOSTATIN ) Apply 1 Application topically 3 (three) times daily as needed. Home med. 12/20/23   Awanda City, MD  oxyCODONE -acetaminophen  (PERCOCET/ROXICET) 5-325 MG tablet Take 1 tablet by mouth 3 (three) times daily as needed for pain. 09/07/21   [provider]  PARoxetine  (PAXIL ) 30 MG tablet Take 30 mg by mouth daily. 09/19/23   [provider]  polyethylene glycol powder (GLYCOLAX /MIRALAX ) 17 GM/SCOOP powder Take 17 g by mouth daily as needed. Home med. 12/20/23    Awanda City, MD  PULMICORT  0.5 MG/2ML nebulizer solution Take 0.5 mg by nebulization every 12 (twelve) hours. Patient not taking: Reported on 03/19/2024 02/13/21   [provider]  roflumilast  (DALIRESP ) 500 MCG TABS tablet Take 1 tablet (500 mcg total) by mouth daily. 03/19/24   Kasa, Kurian, MD  terbinafine (LAMISIL) 1 % cream Apply 1 application  topically 2 (two) times daily. To affected skin, for 1 week after resolution of rash. # 30 gm, 1 refill. Patient not taking: Reported on 03/19/2024    [provider]  TRULICITY 4.5 MG/0.5ML SOPN Inject 4.5 mg into the skin once a week. 09/28/21   [provider]  Vitamin D , Ergocalciferol , (DRISDOL ) 1.25 MG (50000 UNIT) CAPS capsule Take 50,000 Units by mouth every 7 (seven) days.    [provider]  fluconazole  (DIFLUCAN ) 150 MG tablet Take 1 tablet day 1 repeat in 1 week 11/15/20   Moye, Virginia , MD  Scheduled Meds:  amLODipine   5 mg Oral Daily   aspirin   81 mg Oral Daily   budesonide  (PULMICORT ) nebulizer solution  0.25 mg Nebulization BID   Chlorhexidine  Gluconate Cloth  6 each Topical Daily   clonazepam   0.5 mg Oral TID   enoxaparin  (LOVENOX ) injection  40 mg Subcutaneous Q24H   insulin  aspart  0-20 Units Subcutaneous TID WC   insulin  aspart  0-5 Units Subcutaneous QHS   insulin  glargine-yfgn  20 Units Subcutaneous BID   ipratropium-albuterol   3 mL Nebulization TID   methylPREDNISolone  (SOLU-MEDROL ) injection  40 mg Intravenous Daily   mirtazapine   45 mg Oral QHS   multivitamin with minerals  1 tablet Oral Daily   mouth rinse  15 mL Mouth Rinse Q2H   pantoprazole  (PROTONIX ) IV  40 mg Intravenous QHS   PARoxetine   30 mg Oral Daily   Continuous Infusions:  azithromycin  Stopped (05/04/24 0222)   ceFEPime  (MAXIPIME ) IV 200 mL/hr at 05/04/24 0824   PRN Meds:.docusate sodium , hydrOXYzine , ipratropium-albuterol , morphine  injection, mouth rinse, oxyCODONE -acetaminophen , polyethylene glycol, sodium  chloride  Active Hospital Problem list   See systems below  Assessment & Plan:   #Anxiety #Depression - Continue outpatient clonazepam , hydroxyzine , paroxetine , and mirtazapine   - Prn morphine  and oxycodone  for pain management   #HFpEF #Hypertension Hx: Hyperlipidemia and CAD Echo 03/09/24: LV function is preserved (EF60-65%). - Continuous telemetry monitoring  - Continue amlodipine  and aspirin    #Acute on Chronic Hypoxic and Hypercapnic Respiratory Failure d/t COPD exacerbation w/ potential PNA #Mucus Plugging vs Aspiration Hx: COPD/Emphysema/Asthma with chronic tracheostomy and home vent Pseudomonas and Acinetobacter PNA - Continue Home vent Settings - Plateau pressures less than 30 cm H20 - Wean PEEP & FiO2 as tolerated, maintain SpO2 > 88% -  Head of bed elevated 30 degrees, VAP protocol in place  - Intermittent chest x-ray & ABG PRN - Ensure adequate pulmonary hygiene  - Continue steroids  - Continue scheduled bronchodilator therapy and nebulized steroids  - Respiratory viral panel   #Sepsis due to suspected pneumonia - Trend WBC and monitor fever curve  - Follow cultures  - Continue cefepime  and azithromycin      #Type 2 Diabetes Mellitus, insulin  dependent - CBG's ac/hs - SSI  - Target CBG range 140 to 180   Critical care time: 35 minutes     [name] Moose, AGNP  Pulmonary/Critical Care Pager [phone] (please enter 7 digits) PCCM Consult Pager [phone] (please enter 7 digits)

## 2024-05-05 LAB — CBC WITH DIFFERENTIAL/PLATELET
Abs Immature Granulocytes: 0.17 K/uL — ABNORMAL HIGH (ref 0.00–0.07)
Basophils Absolute: 0 K/uL (ref 0.0–0.1)
Basophils Relative: 0 %
Eosinophils Absolute: 0 K/uL (ref 0.0–0.5)
Eosinophils Relative: 0 %
HCT: 39.1 % (ref 36.0–46.0)
Hemoglobin: 12 g/dL (ref 12.0–15.0)
Immature Granulocytes: 2 %
Lymphocytes Relative: 11 %
Lymphs Abs: 0.9 K/uL (ref 0.7–4.0)
MCH: 25.5 pg — ABNORMAL LOW (ref 26.0–34.0)
MCHC: 30.7 g/dL (ref 30.0–36.0)
MCV: 83 fL (ref 80.0–100.0)
Monocytes Absolute: 0.3 K/uL (ref 0.1–1.0)
Monocytes Relative: 4 %
Neutro Abs: 6.4 K/uL (ref 1.7–7.7)
Neutrophils Relative %: 83 %
Platelets: 309 K/uL (ref 150–400)
RBC: 4.71 MIL/uL (ref 3.87–5.11)
RDW: 13.9 % (ref 11.5–15.5)
WBC: 7.8 K/uL (ref 4.0–10.5)
nRBC: 0 % (ref 0.0–0.2)

## 2024-05-05 LAB — CULTURE, BLOOD (ROUTINE X 2): Culture: NO GROWTH

## 2024-05-05 LAB — GLUCOSE, CAPILLARY
Glucose-Capillary: 163 mg/dL — ABNORMAL HIGH (ref 70–99)
Glucose-Capillary: 253 mg/dL — ABNORMAL HIGH (ref 70–99)
Glucose-Capillary: 316 mg/dL — ABNORMAL HIGH (ref 70–99)
Glucose-Capillary: 159 mg/dL — ABNORMAL HIGH (ref 70–99)

## 2024-05-05 LAB — RENAL FUNCTION PANEL
Albumin: 4.3 g/dL (ref 3.5–5.0)
Anion gap: 12 (ref 5–15)
BUN: 21 mg/dL (ref 8–23)
CO2: 26 mmol/L (ref 22–32)
Calcium: 9.4 mg/dL (ref 8.9–10.3)
Chloride: 101 mmol/L (ref 98–111)
Creatinine, Ser: 0.77 mg/dL (ref 0.44–1.00)
GFR, Estimated: >60 mL/min (ref >60)
Glucose, Bld: 315 mg/dL — ABNORMAL HIGH (ref 70–99)
Phosphorus: 2.6 mg/dL (ref 2.5–4.6)
Potassium: 4.5 mmol/L (ref 3.5–5.1)
Sodium: 139 mmol/L (ref 135–145)

## 2024-05-05 LAB — MAGNESIUM: Magnesium: 2.2 mg/dL (ref 1.7–2.4)

## 2024-05-05 MED ORDER — TRAZODONE HCL 50 MG PO TABS
50.0000 mg | ORAL_TABLET | Freq: Every evening | ORAL | Status: DC | PRN
  Administered 2024-05-05 (×2): 50 mg via ORAL
  Filled 2024-05-05 (×2): qty 1

## 2024-05-05 NOTE — Progress Notes (Signed)
 [facility]  PROCEDURAL EXPEDITER PROGRESS NOTE  Patient Name: Michelle [name]  DOB:September 17, 1960 Date of Admission: 04/30/2024  Date of Assessment:05/05/24   ------------------------------------------------------------------------------------------------------------------- Pt has a trach and intubated.   Came from home with trach and intubated.     -------------------------------------------------------------------------------------------------------------------  [facility] Exped[facility], Michelle [name] Please contact us  directly via secure chat (search for [facility]) or by calling us  at [phone] [facility]).

## 2024-05-05 NOTE — Progress Notes (Signed)
 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, HTN, HLD, CHF, CAD s/p STEMI s/ stent 2009, DM type 2 w/ insulin  dependence, Hepatitis C, Former tobacco and cocaine use, Anxiety/Depression, and Obesity who presented to the ED with chief complaints of progressive shortness of breath  ED Course: Initial vital signs showed HR: 144 bpm, BP: 167/136 mmHg, RR: 30 breaths/min, Temp: 99.67F (37.6C), SpO2: 99%,patient was placed on the vent with Respiratory Support: Ventilator settings: Mode: PRVC, Set Rate: 16 bpm, Vt: 400 mL, PEEP: 5 cm H2O, Pressure Support: 10 cm H2O  Labs/Diagnostics: CBC: WBC: 10.0, Hgb: 12.2, Hct: 39.9,PLT: 303 Basic Metabolic Panel: Na+: 142, K+: 3.4 (low), Cl-: 102, CO2: 29, Glucose: 210 (high), BUN: 10, Creatinine: 0.86, Calcium : 10.0 Lactate: 2.4  ABG: Pending results for pO2, pCO2, pH, HCO3 Chest X-ray: Chronic left lateral basal sulcal blunting. Increased opacity in infrahilar areas, suggestive of atelectasis or aspiration changes.  Medications Administered: Cefepime  2g IV, Levofloxacin  750 mg IV, DuoNeb's, Valium  5mg  IV, Solumedrol 125mg  IV, 1L NS bolus. PCCM consulted for admission.  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 Cultures pending, broad spectrum antibiotics.  05/05/24- patient on 6L/min , on abx for pna, has exertnal urinary collection system. Viral workup negative.   Interim History / Subjective:      Micro Data:  11/27: SARS-CoV-2 PCR> negative 11/27: Influenza PCR> negative 11/27: Blood culture x1> NGTD 11/28: Urine Culture>negative  11/28: MRSA PCR>>negative  11/28: Strep pneumo Ur Ag>>negative  11/28: Legionella Ur Ag>>negative  12/01: Respiratory Viral Panel>  Antimicrobials:   Antibiotics Given (last 72 hours)     Date/Time Action Medication Dose Rate   05/02/24 1347 New Bag/Given   ceFEPIme  (MAXIPIME ) 2 g in sodium chloride  0.9 % 100 mL IVPB 2 g 200 mL/hr   05/02/24 2206 New Bag/Given   azithromycin  (ZITHROMAX ) 500 mg in sodium chloride  0.9 % 250 mL IVPB 500 mg 250 mL/hr   05/03/24 0031 New Bag/Given   ceFEPIme  (MAXIPIME ) 2 g in sodium chloride  0.9 % 100 mL IVPB 2 g 200 mL/hr   05/03/24 0646 New Bag/Given   ceFEPIme  (MAXIPIME ) 2 g in sodium chloride  0.9 % 100 mL IVPB 2 g 200 mL/hr   05/03/24 1421 New Bag/Given   ceFEPIme  (MAXIPIME ) 2 g in sodium chloride  0.9 % 100 mL IVPB 2 g 200 mL/hr   05/04/24 0028 New Bag/Given   ceFEPIme  (MAXIPIME ) 2 g in sodium chloride  0.9 % 100 mL IVPB 2 g 200 mL/hr   05/04/24 0113 New Bag/Given   azithromycin  (ZITHROMAX ) 500 mg in sodium chloride  0.9 % 250 mL IVPB 500 mg 250 mL/hr   05/04/24 9192 New Bag/Given   ceFEPIme  (MAXIPIME ) 2 g in sodium chloride  0.9 % 100 mL IVPB 2 g 200 mL/hr   05/04/24 1545 New Bag/Given   ceFEPIme  (MAXIPIME ) 2 g in  sodium chloride  0.9 % 100 mL IVPB 2 g 200 mL/hr   05/05/24 0018 New Bag/Given   ceFEPIme  (MAXIPIME ) 2 g in sodium chloride  0.9 % 100 mL IVPB 2 g 200 mL/hr   05/05/24 0745 New Bag/Given   ceFEPIme  (MAXIPIME ) 2 g in sodium chloride  0.9 % 100 mL IVPB 2 g 200 mL/hr      OBJECTIVE  Blood pressure (!) 167/136, pulse (!) 144, temperature 99.6 F (37.6 C), temperature source Oral, resp. rate  (!) 27, height 5' 0.98 (1.549 m), weight 97.5 kg, last menstrual period 03/06/2005, SpO2 99%.    Vent Mode: Other (Comment) Set Rate: [16 bmp] 16 bmp Vt Set: [400 mL] 400 mL PEEP: [5 cmH20] 5 cmH20 Pressure Support: [10 cmH20] 10 cmH20   Intake/Output Summary (Last 24 hours) at 05/01/2024 0028 Last data filed at 05/01/2024 0028 Gross per 24 hours  Intake 100 ml  Output --  Net 100 ml   Filed Weights   04/30/24 2324  Weight: 97.5 kg   Physical Examination  GEN: Chronically-ill appearing female, NAD on home vent via chronic tracheostomy  HEENT: Supple, difficult to assess for JVD due to body habitus  HEART: NSR, s1s2, no m/r/g, 2+ radial/2+ distal pulses, no edema   LUNGS: Faint rhonchi throughout, even, non labored  ABDOMINAL: +BS x4, obese, non tender, non distended  EXTREMITIES: Normal bulk and tone, moves all extremities  NEURO: Awake and following commands, PERRLA SKIN: Intact, warm, no rashes lesion, or ulcer  Labs/imaging that I have personally reviewed  (right click and Reselect all Smart List Selections daily)   DG Chest 1 View Result Date: 05/01/2024 EXAM: 1 VIEW(S) XRAY OF THE CHEST 04/30/2024 11:49:00 PM COMPARISON: Portable chest 03/11/2024. CLINICAL HISTORY: SOB. FINDINGS: LINES, TUBES AND DEVICES: Tracheostomy cannula positioning is unchanged. Multiple overlying monitor wires. LUNGS AND PLEURA: Chronic left lateral basal sulcal blunting. Increased opacity of the infrahilar areas, which could reflect atelectasis or changes of recent aspiration. The remainder of the lungs is clear. No pleural effusion. No pneumothorax. HEART AND MEDIASTINUM: The heart size is normal. Stable mediastinum with aortic atherosclerosis. BONES AND SOFT TISSUES: No acute osseous abnormality. IMPRESSION: 1. Increased infrahilar opacities, which may represent atelectasis or recent aspiration changes. 2. Chronic left lateral basal sulcal blunting. Electronically signed by: Francis Quam MD  05/01/2024 12:03 AM EST RP Workstation: [id number]    Labs   CBC: Recent Labs  Lab 04/30/24 2326  WBC 10.0  NEUTROABS 6.2  HGB 12.2  HCT 39.9  MCV 84.4  PLT 303   Basic Metabolic Panel: Recent Labs  Lab 04/30/24 2326  NA 142  K 3.4*  CL 102  CO2 29  GLUCOSE 210*  BUN 10  CREATININE 0.86  CALCIUM  10.0   GFR: Estimated Creatinine Clearance: 72.5 mL/min (by C-G formula based on SCr of 0.86 mg/dL). Recent Labs  Lab 04/30/24 2326  WBC 10.0  LATICACIDVEN 2.4*   Liver Function Tests: Recent Labs  Lab 04/30/24 2326  AST 27  ALT 20  ALKPHOS 158*  BILITOT 0.3  PROT 8.9*  ALBUMIN  4.5   No results for input(s): LIPASE, AMYLASE in the last 168 hours. No results for input(s): AMMONIA in the last 168 hours.  ABG    Component Value Date/Time   PHART 7.38 03/12/2024 1053   PCO2ART 50 (H) 03/12/2024 1053   PO2ART 75 (L) 03/12/2024 1053   HCO3 29.6 (H) 03/12/2024 1053   ACIDBASEDEF 1.5 08/29/2023 0307   O2SAT 98 03/12/2024 1053  Coagulation Profile: No results for input(s): INR, PROTIME in the last 168 hours.  Cardiac Enzymes: No results for input(s): CKTOTAL, CKMB, CKMBINDEX, TROPONINI in the last 168 hours.  HbA1C: Hemoglobin A1C  Date/Time Value Ref Range Status  06/29/2013 04:02 AM 6.0 4.2 - 6.3 % Final    Comment:    The American Diabetes Association recommends that a primary goal of therapy should be <7% and that physicians should reevaluate the treatment regimen in patients with HbA1c values consistently >8%.    Hgb A1c MFr Bld  Date/Time Value Ref Range Status  03/10/2024 04:17 AM 8.7 (H) 4.8 - 5.6 % Final    Comment:    (NOTE) Diagnosis of Diabetes The following HbA1c ranges recommended by the American Diabetes Association (ADA) may be used as an aid in the diagnosis of diabetes mellitus.  Hemoglobin             Suggested A1C NGSP%              Diagnosis  <5.7                   Non Diabetic  5.7-6.4                 Pre-Diabetic  >6.4                   Diabetic  <7.0                   Glycemic control for                       adults with diabetes.    08/29/2023 01:01 AM 9.4 (H) 4.8 - 5.6 % Final    Comment:    (NOTE) Pre diabetes:          5.7%-6.4%  Diabetes:              >6.4%  Glycemic control for   <7.0% adults with diabetes     CBG: No results for input(s): GLUCAP in the last 168 hours.  Review of Systems:   Unable to be obtained secondary to the patient's intubated and sedated status.   Past Medical History  She, has a past medical history of Allergy, Anxiety, Asthma, CHF (congestive heart failure) (HCC), Cocaine abuse (HCC), COPD (chronic obstructive pulmonary disease) (HCC), Coronary artery disease, Emphysema/COPD, Hepatitis C, High cholesterol, Hypertension, Pneumothorax, and Tobacco abuse.   Surgical History    Past Surgical History:  Procedure Laterality Date   CARDIAC CATHETERIZATION     CHEST TUBE INSERTION     IR GASTROSTOMY TUBE MOD SED  10/31/2017   TRACHEOSTOMY TUBE PLACEMENT N/A 10/17/2017   Procedure: TRACHEOSTOMY; Surgeon: Milissa Hamming, MD; Location: [facility] ORS; Service: ENT; Laterality: N/A;   Social History   reports that she quit smoking about 6 years ago. Her smoking use included cigarettes. She started smoking about 47 years ago. She has a 4.1 pack-year smoking history. She has never used smokeless tobacco. She reports that she does not currently use drugs after having used the following drugs: Crack cocaine. She reports that she does not drink alcohol.   Family History   Her family history includes Asthma in her mother; Breast cancer (age of onset: 68) in her maternal aunt; CAD in her father; COPD in her sister; CVA in her father; Lung cancer in her mother; Stroke in her father.   Allergies Allergies  Allergen Reactions   Lorazepam   Makes anxiety worse  Other Reaction(s): Makes anxiety worse   Lisinopril  Cough   Amoxicillin      Patient has  tolerated cephalosporins in the past     Home Medications  Prior to Admission medications   Medication Sig Start Date End Date Taking? Authorizing Provider  acetaminophen  (TYLENOL ) 325 MG tablet Take 2 tablets (650 mg total) by mouth every 6 (six) hours as needed for mild pain (pain score 1-3) (or Fever >/= 101). 04/24/23   Dorinda Drue DASEN, MD  albuterol  (PROVENTIL ) (2.5 MG/3ML) 0.083% nebulizer solution Take 3 mLs (2.5 mg total) by nebulization every 6 (six) hours as needed for wheezing or shortness of breath. 03/27/23   Tamea Dedra CROME, MD  albuterol  (VENTOLIN  HFA) 108 [phone] Base) MCG/ACT inhaler Inhale 2 puffs into the lungs every 6 (six) hours as needed for wheezing or shortness of breath. 04/02/22   Parrett, Madelin RAMAN, NP  amLODipine  (NORVASC ) 5 MG tablet Take 5 mg by mouth daily. 08/27/21   [provider]  aspirin  81 MG chewable tablet Chew 81 mg by mouth daily.     [provider]  azelastine  (ASTELIN ) 0.1 % nasal spray SMARTSIG:1-2 Spray(s) Both Nares Every 12 Hours PRN 09/22/20   [provider]  azithromycin  (ZITHROMAX  Z-PAK) 250 MG tablet Take 500 mg by mouth daily for one day followed by 250 mg daily by mouth for four days 03/12/24   Shellia Inge BIRCH, NP  Blood Glucose Monitoring Suppl ([employer] BLOOD GLUCOSE METER) w/Device KIT Use as directed to monitor blood glucose up to qid 02/22/23   Alexander, Natalie, DO  cetirizine  (ZYRTEC ) 10 MG tablet Take 1 tablet (10 mg total) by mouth daily as needed for allergies. Home med. 12/20/23   Awanda City, MD  clonazePAM  (KLONOPIN ) 0.5 MG tablet Take 0.5 mg by mouth in the morning, at noon, and at bedtime.    [provider]  cloNIDine  (CATAPRES  - DOSED IN MG/24 HR) 0.1 mg/24hr patch Place 0.1 mg onto the skin once a week.    [provider]  docusate sodium  (COLACE) 100 MG capsule Take 2 capsules (200 mg total) by mouth daily as needed for mild constipation. 04/24/23   Dorinda Drue DASEN, MD  famotidine  (PEPCID ) 20 MG  tablet Take 1 tablet by mouth daily as needed for heartburn 09/24/23   [provider]  fluconazole  (DIFLUCAN ) 150 MG tablet Take 1 tablet (150 mg total) by mouth once a week. 03/02/24   Brimage, Vondra, DO  fluticasone  (FLONASE ) 50 MCG/ACT nasal spray Place 1 spray into both nostrils daily as needed for allergies or rhinitis. Home med. 12/20/23   Awanda City, MD  guaiFENesin  (MUCINEX ) 600 MG 12 hr tablet Take 600 mg by mouth 2 (two) times daily.    [provider]  hydrOXYzine  (VISTARIL ) 25 MG capsule Take 25 mg by mouth every 6 (six) hours as needed. 10/01/22   [provider]  ipratropium-albuterol  (DUONEB) 0.5-2.5 (3) MG/3ML SOLN Take 3 mLs by nebulization every 6 (six) hours as needed (for shortness of breath). 06/17/23   Kasa, Kurian, MD  LANTUS  SOLOSTAR 100 UNIT/ML Solostar Pen Inject 40 Units into the skin at bedtime. 01/03/23   [provider]  mirtazapine  (REMERON ) 45 MG tablet Take 45 mg by mouth at bedtime. 01/07/24   [provider]  Multiple Vitamin (MULTIVITAMIN WITH MINERALS) TABS tablet Take 1 tablet by mouth daily. 12/20/23   Awanda City, MD  naloxone  (NARCAN ) nasal spray 4 mg/0.1 mL Place 1 spray into  the nose once as needed for up to 1 dose. Home med. 12/20/23   Awanda City, MD  NOVOLOG  FLEXPEN 100 UNIT/ML FlexPen Inject 5-10 Units into the skin 3 (three) times daily with meals. Sliding scale mealtime insulin . 03/01/21   [provider]  nystatin  ointment (MYCOSTATIN ) Apply 1 Application topically 3 (three) times daily as needed. Home med. 12/20/23   Awanda City, MD  oxyCODONE -acetaminophen  (PERCOCET/ROXICET) 5-325 MG tablet Take 1 tablet by mouth 3 (three) times daily as needed for pain. 09/07/21   [provider]  PARoxetine  (PAXIL ) 30 MG tablet Take 30 mg by mouth daily. 09/19/23   [provider]  polyethylene glycol powder (GLYCOLAX /MIRALAX ) 17 GM/SCOOP powder Take 17 g by mouth daily as needed. Home med. 12/20/23   Awanda City, MD   PULMICORT  0.5 MG/2ML nebulizer solution Take 0.5 mg by nebulization every 12 (twelve) hours. Patient not taking: Reported on 03/19/2024 02/13/21   [provider]  roflumilast  (DALIRESP ) 500 MCG TABS tablet Take 1 tablet (500 mcg total) by mouth daily. 03/19/24   Kasa, Kurian, MD  terbinafine (LAMISIL) 1 % cream Apply 1 application  topically 2 (two) times daily. To affected skin, for 1 week after resolution of rash. # 30 gm, 1 refill. Patient not taking: Reported on 03/19/2024    [provider]  TRULICITY 4.5 MG/0.5ML SOPN Inject 4.5 mg into the skin once a week. 09/28/21   [provider]  Vitamin D , Ergocalciferol , (DRISDOL ) 1.25 MG (50000 UNIT) CAPS capsule Take 50,000 Units by mouth every 7 (seven) days.    [provider]  fluconazole  (DIFLUCAN ) 150 MG tablet Take 1 tablet day 1 repeat in 1 week 11/15/20   Moye, Virginia , MD  Scheduled Meds:  amLODipine   5 mg Oral Daily   aspirin   81 mg Oral Daily   budesonide  (PULMICORT ) nebulizer solution  0.25 mg Nebulization BID   Chlorhexidine  Gluconate Cloth  6 each Topical Daily   clonazePAM   0.5 mg Oral TID   enoxaparin  (LOVENOX ) injection  40 mg Subcutaneous Q24H   insulin  aspart  0-20 Units Subcutaneous TID WC   insulin  aspart  0-5 Units Subcutaneous QHS   insulin  glargine-yfgn  20 Units Subcutaneous BID   ipratropium-albuterol   3 mL Nebulization TID   methylPREDNISolone  (SOLU-MEDROL ) injection  40 mg Intravenous Daily   mirtazapine   45 mg Oral QHS   multivitamin with minerals  1 tablet Oral Daily   mouth rinse  15 mL Mouth Rinse Q2H   pantoprazole   40 mg Oral Daily   PARoxetine   30 mg Oral Daily   Continuous Infusions:  ceFEPime  (MAXIPIME ) IV 2 g (05/05/24 0745)   PRN Meds:.docusate sodium , hydrOXYzine , ipratropium-albuterol , morphine  injection, mouth rinse, oxyCODONE -acetaminophen , polyethylene glycol, sodium chloride , traZODone   Active Hospital Problem list   See systems below  Assessment &  Plan:   #Anxiety #Depression - Continue outpatient clonazepam , hydroxyzine , paroxetine , and mirtazapine   - Prn morphine  and oxycodone  for pain management   #HFpEF #Hypertension Hx: Hyperlipidemia and CAD Echo 03/09/24: LV function is preserved (EF60-65%). - Continuous telemetry monitoring  - Continue amlodipine  and aspirin    #Acute on Chronic Hypoxic and Hypercapnic Respiratory Failure d/t COPD exacerbation w/ potential PNA #Mucus Plugging vs Aspiration Hx: COPD/Emphysema/Asthma with chronic tracheostomy and home vent Pseudomonas and Acinetobacter PNA - Continue Home vent Settings - Plateau pressures less than 30 cm H20 - Wean PEEP & FiO2 as tolerated, maintain SpO2 > 88% - Head of bed elevated 30 degrees, VAP protocol in place  -  Intermittent chest x-ray & ABG PRN - Ensure adequate pulmonary hygiene  - Continue steroids  - Continue scheduled bronchodilator therapy and nebulized steroids  - Respiratory viral panel   #Sepsis due to suspected pneumonia - Trend WBC and monitor fever curve  - Follow cultures  - Continue cefepime  and azithromycin      #Type 2 Diabetes Mellitus, insulin  dependent - CBG's ac/hs - SSI  - Target CBG range 140 to 180   Critical care provider statement:   Total critical care time: 33 minutes   Performed by: Parris MD   Critical care time was exclusive of separately billable procedures and treating other patients.   Critical care was necessary to treat or prevent imminent or life-threatening deterioration.   Critical care was time spent personally by me on the following activities: development of treatment plan with patient and/or surrogate as well as nursing, discussions with consultants, evaluation of patient's response to treatment, examination of patient, obtaining history from patient or surrogate, ordering and performing treatments and interventions, ordering and review of laboratory studies, ordering and review of radiographic studies,  pulse oximetry and re-evaluation of patient's condition.    Taeya Theall, M.D.  Pulmonary & Critical Care Medicine

## 2024-05-05 NOTE — Plan of Care (Signed)
  Problem: Coping: Goal: Ability to adjust to condition or change in health will improve Outcome: Progressing   Problem: Fluid Volume: Goal: Ability to maintain a balanced intake and output will improve Outcome: Progressing   Problem: Skin Integrity: Goal: Risk for impaired skin integrity will decrease Outcome: Progressing   Problem: Coping: Goal: Level of anxiety will decrease Outcome: Progressing

## 2024-05-05 NOTE — TOC Progression Note (Signed)
 Transition of Care [facility]) - Progression Note    Patient Details  Name: Michelle [name] MRN: [id number] Date of Birth: Aug 12, 1960  Transition of [facility]) CM/SW Contact  K'La JINNY Ruts, LCSW Phone Number: 05/05/2024, 1:35 PM  Clinical Narrative:   Chart reviewed. There are no TOC needs identified. Readmission screen complete.                      Expected Discharge Plan and Services                                               Social Drivers of Health (SDOH) Interventions SDOH Screenings   Food Insecurity: No Food Insecurity (05/01/2024)  Housing: Low Risk  (05/01/2024)  Transportation Needs: No Transportation Needs (05/01/2024)  Utilities: Not At Risk (05/01/2024)  Financial Resource Strain: Medium Risk (06/29/2017)  Physical Activity: Inactive (06/29/2017)  Social Connections: Unknown (06/29/2017)  Stress: Stress Concern Present (06/29/2017)  Tobacco Use: Medium Risk (05/04/2024)    Readmission Risk Interventions    05/05/2024    1:35 PM 03/12/2024    2:20 PM 12/16/2023    1:28 PM  Readmission Risk Prevention Plan  Transportation Screening Complete  Complete  Medication Review [employer]) Complete  Complete  PCP or Specialist appointment within 3-5 days of discharge Complete Complete Complete  HRI or Home Care Consult Complete Complete Complete  SW Recovery Care/Counseling Consult Complete Complete Complete  Palliative Care Screening Not Applicable Not Applicable Not Applicable  Skilled Nursing Facility Not Applicable Not Applicable Not Applicable

## 2024-05-05 NOTE — Plan of Care (Signed)

## 2024-05-05 NOTE — TOC Progression Note (Signed)
 Transition of Care [facility]) - Progression Note    Patient Details  Name: Michelle [name] MRN: [id number] Date of Birth: [birth year]  Transition of Care [facility]) CM/SW Contact  [name], LCSW Phone Number: 05/05/2024, 11:25 AM  Clinical Narrative:    Chart reviewed. The patient has been sleep and will not wake up after two call outs of her name. TOC will follow up with the patient at a later time.                      Expected Discharge Plan and Services                                               Social Drivers of Health (SDOH) Interventions SDOH Screenings   Food Insecurity: No Food Insecurity (05/01/2024)  Housing: Low Risk  (05/01/2024)  Transportation Needs: No Transportation Needs (05/01/2024)  Utilities: Not At Risk (05/01/2024)  Financial Resource Strain: Medium Risk (06/29/2017)  Physical Activity: Inactive (06/29/2017)  Social Connections: Unknown (06/29/2017)  Stress: Stress Concern Present (06/29/2017)  Tobacco Use: Medium Risk (05/04/2024)    Readmission Risk Interventions    03/12/2024    2:20 PM 12/16/2023    1:28 PM 04/19/2023    2:10 PM  Readmission Risk Prevention Plan  Transportation Screening  Complete Complete  PCP or Specialist Appt within 3-5 Days   Complete  HRI or Home Care Consult   Complete  Social Work Consult for Recovery Care Planning/Counseling   Complete  Palliative Care Screening   Complete  Medication Review Ocean[facility])  Complete Complete  PCP or Specialist appointment within 3-5 days of discharge Complete Complete   HRI or Home Care Consult Complete Complete   SW Recovery Care/Counseling Consult Complete Complete   Palliative Care Screening Not Applicable Not Applicable   Skilled Nursing Facility Not Applicable Not Applicable

## 2024-05-06 LAB — CBC WITH DIFFERENTIAL/PLATELET
Abs Immature Granulocytes: 0.32 K/uL — ABNORMAL HIGH (ref 0.00–0.07)
Basophils Absolute: 0.1 K/uL (ref 0.0–0.1)
Basophils Relative: 1 %
Eosinophils Absolute: 0.1 K/uL (ref 0.0–0.5)
Eosinophils Relative: 1 %
HCT: 33.2 % — ABNORMAL LOW (ref 36.0–46.0)
Hemoglobin: 10.5 g/dL — ABNORMAL LOW (ref 12.0–15.0)
Immature Granulocytes: 3 %
Lymphocytes Relative: 23 %
Lymphs Abs: 2.4 K/uL (ref 0.7–4.0)
MCH: 26.1 pg (ref 26.0–34.0)
MCHC: 31.6 g/dL (ref 30.0–36.0)
MCV: 82.4 fL (ref 80.0–100.0)
Monocytes Absolute: 0.9 K/uL (ref 0.1–1.0)
Monocytes Relative: 9 %
Neutro Abs: 6.7 K/uL (ref 1.7–7.7)
Neutrophils Relative %: 63 %
Platelets: 255 K/uL (ref 150–400)
RBC: 4.03 MIL/uL (ref 3.87–5.11)
RDW: 13.9 % (ref 11.5–15.5)
WBC: 10.5 K/uL (ref 4.0–10.5)
nRBC: 0.6 % — ABNORMAL HIGH (ref 0.0–0.2)

## 2024-05-06 LAB — RENAL FUNCTION PANEL
Albumin: 3.6 g/dL (ref 3.5–5.0)
Anion gap: 12 (ref 5–15)
BUN: 22 mg/dL (ref 8–23)
CO2: 23 mmol/L (ref 22–32)
Calcium: 8.8 mg/dL — ABNORMAL LOW (ref 8.9–10.3)
Chloride: 105 mmol/L (ref 98–111)
Creatinine, Ser: 0.66 mg/dL (ref 0.44–1.00)
GFR, Estimated: >60 mL/min (ref >60)
Glucose, Bld: 177 mg/dL — ABNORMAL HIGH (ref 70–99)
Phosphorus: 2.8 mg/dL (ref 2.5–4.6)
Potassium: 3.7 mmol/L (ref 3.5–5.1)
Sodium: 140 mmol/L (ref 135–145)

## 2024-05-06 LAB — GLUCOSE, CAPILLARY
Glucose-Capillary: 145 mg/dL — ABNORMAL HIGH (ref 70–99)
Glucose-Capillary: 174 mg/dL — ABNORMAL HIGH (ref 70–99)
Glucose-Capillary: 246 mg/dL — ABNORMAL HIGH (ref 70–99)

## 2024-05-06 LAB — MAGNESIUM: Magnesium: 2 mg/dL (ref 1.7–2.4)

## 2024-05-06 MED ORDER — METHYLPREDNISOLONE 4 MG PO TBPK: 4.0000 mg | ORAL_TABLET | ORAL | Status: DC

## 2024-05-06 MED ORDER — METHYLPREDNISOLONE 4 MG PO TBPK: 4.0000 mg | ORAL_TABLET | Freq: Three times a day (TID) | ORAL | Status: DC

## 2024-05-06 MED ORDER — METHYLPREDNISOLONE 4 MG PO TBPK: 8.0000 mg | ORAL_TABLET | Freq: Every evening | ORAL | Status: DC

## 2024-05-06 MED ORDER — METHYLPREDNISOLONE 4 MG PO TBPK: 4.0000 mg | ORAL_TABLET | Freq: Four times a day (QID) | ORAL | Status: DC

## 2024-05-06 MED ORDER — METHYLPREDNISOLONE 4 MG PO TBPK: 8.0000 mg | ORAL_TABLET | Freq: Every morning | ORAL | Status: DC

## 2024-05-06 MED ORDER — PREDNISONE 10 MG PO TABS: ORAL_TABLET | ORAL | 0 refills | Status: AC

## 2024-05-06 NOTE — Plan of Care (Signed)
  Problem: Coping: Goal: Ability to adjust to condition or change in health will improve Outcome: Progressing   Problem: Fluid Volume: Goal: Ability to maintain a balanced intake and output will improve Outcome: Progressing   Problem: Health Behavior/Discharge Planning: Goal: Ability to identify and utilize available resources and services will improve Outcome: Progressing Goal: Ability to manage health-related needs will improve Outcome: Progressing   Problem: Metabolic: Goal: Ability to maintain appropriate glucose levels will improve Outcome: Progressing   Problem: Nutritional: Goal: Maintenance of adequate nutrition will improve Outcome: Progressing   Problem: Tissue Perfusion: Goal: Adequacy of tissue perfusion will improve Outcome: Progressing   Problem: Clinical Measurements: Goal: Respiratory complications will improve Outcome: Progressing

## 2024-05-06 NOTE — Discharge Summary (Signed)
 Physician Discharge Summary  Patient ID: CAROLYNA YERIAN MRN: [id number] DOB/AGE: Dec 22, 1960 63 y.o.  Admit date: 04/30/2024 Discharge date: 05/06/2024   Brief Pt Description / Synopsis:  63 y.o. 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   Discharge Diagnoses:   Acute on Chronic Hypoxic & Hypercapnic Respiratory Failure Acute COPD Exacerbation Suspected Pneumonia Mucus Plugging                                                           Discharge Summary:  Lillie Portner is a 63 year old female with a PMH significant for MRSA and Pseudomonas pneumonia, Atypical pneumonia, COPD/Emphysema/Asthma, 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, HTN, HLD, CHF, CAD s/p STEMI s/ stent 2009, DM type 2 w/ insulin  dependence, Hepatitis C, Former tobacco and cocaine use, Anxiety/Depression, and Obesity who presented to the ED with chief complaints of progressive shortness of breath   ED Course: Initial vital signs showed HR: 144 bpm, BP: 167/136 mmHg, RR: 30 breaths/min, Temp: 99.54F (37.6C), SpO2: 99%,patient was placed on the vent with Respiratory Support: Ventilator settings: Mode: PRVC, Set Rate: 16 bpm, Vt: 400 mL, PEEP: 5 cm H2O, Pressure Support: 10 cm H2O   Labs/Diagnostics: CBC: WBC: 10.0, Hgb: 12.2, Hct: 39.9,PLT: 303 Basic Metabolic Panel: Na+: 142, K+: 3.4 (low), Cl-: 102, CO2: 29, Glucose: 210 (high), BUN: 10, Creatinine: 0.86, Calcium : 10.0 Lactate: 2.4  ABG: Pending results for pO2, pCO2, pH, HCO3 Chest X-ray: Chronic left lateral basal sulcal blunting. Increased opacity in infrahilar areas, suggestive of atelectasis or aspiration changes.   Medications Administered: Cefepime  2g IV, Levofloxacin  750 mg IV, DuoNeb's, Valium  5mg  IV, Solumedrol 125mg  IV, 1L NS bolus. PCCM consulted for  admission.    Significant Events:  11/28: Admit to ICU on home ventilator with acute on chronic respiratory failure iso AECOPD, Pneumonia. Cultures pending, broad spectrum antibiotics.  12/2- patient on 6L/min , on abx for pna, has exertnal urinary collection system. Viral workup negative.  12/3- patient with no overnight events. Remains on 6L/min [place].  Hemodynamically stable, ready for discharge home.   Discharge Plan by Diagnosis:   #Acute on Chronic Hypoxic & Hypercapnic Respiratory Failure #Acute COPD Exacerbation #Suspected Pneumonia #Mucus Plugging PMHx: COPD/Asthma with chronic tracheostomy and home vent, Pseudomonas and Acinetobacter pneumonia -Continue home vent support, implement lung protective strategies -Completed ABX as below -Continue home Bronchodilators -Complete Steroid taper at home upon discharge -Follow up with Dr. Isaiah on 05/13/24 @ 10:45 am                     Significant Diagnostic Studies:  11/27: Chest X-ray>>IMPRESSION: 1. Increased infrahilar opacities, which may represent atelectasis or recent aspiration changes. 2. Chronic left lateral basal sulcal blunting.            Micro Data:  11/27: SARS-CoV-2/Flu/RSR PCR> negative 11/27: Blood culture x1> No growth 11/28: Urine Culture>negative  11/28: MRSA PCR>>negative  11/28: Strep pneumo Ur Ag>>negative  11/28: Legionella Ur Ag>>negative  12/01: Respiratory Viral Panel>negative   Antimicrobials:   Anti-infectives (From admission, onward)    Start     Dose/Rate Route Frequency Ordered Stop   05/01/24  2200  azithromycin  (ZITHROMAX ) 500 mg in sodium chloride  0.9 % 250 mL IVPB  Status:  Discontinued        500 mg 250 mL/hr over 60 Minutes Intravenous Every 24 hours 05/01/24 0615 05/04/24 1115   05/01/24 0630  ceFEPIme  (MAXIPIME ) 2 g in sodium chloride  0.9 % 100 mL IVPB        2 g 200 mL/hr over 30 Minutes Intravenous Every 8 hours 05/01/24 0616 05/05/24 2359   05/01/24 0145  vancomycin   (VANCOREADY) IVPB 2000 mg/400 mL        2,000 mg 200 mL/hr over 120 Minutes Intravenous  Once 05/01/24 0134 05/01/24 0402   04/30/24 2345  ceFEPIme  (MAXIPIME ) 2 g in sodium chloride  0.9 % 100 mL IVPB        2 g 200 mL/hr over 30 Minutes Intravenous  Once 04/30/24 2342 05/01/24 0028   04/30/24 2345  levofloxacin  (LEVAQUIN ) IVPB 750 mg        750 mg 100 mL/hr over 90 Minutes Intravenous  Once 04/30/24 2342 05/01/24 0225        Consults:  PCCM   Discharge Exam:   GEN: Chronically-ill appearing female, NAD on home vent via chronic tracheostomy  HEENT: Supple, difficult to assess for JVD due to body habitus  HEART: NSR, s1s2, no m/r/g, 2+ radial/2+ distal pulses, no edema   LUNGS: Faint rhonchi throughout, even, non labored  ABDOMINAL: +BS x4, obese, non tender, non distended  EXTREMITIES: Normal bulk and tone, moves all extremities  NEURO: Awake and following commands, PERRLA SKIN: Intact, warm, no rashes lesion, or ulcer  Vitals:   05/06/24 1000 05/06/24 1100 05/06/24 1200 05/06/24 1300  BP: 130/64 126/73 130/87 135/74  Pulse: 67 65 63 75  Resp: 16 16 16 17   Temp:   97.9 F (36.6 C)   TempSrc:   Oral   SpO2: 100% 100% 100% 100%  Weight:      Height:         Discharge Labs:   BMET Recent Labs  Lab 05/01/24 0502 05/01/24 1635 05/02/24 0355 05/03/24 0452 05/04/24 0311 05/05/24 1611 05/06/24 0420  NA 139  --  140 142 142 139 140  K 3.2*   < > 4.3 3.9 3.9 4.5 3.7  CL 102  --  108 107 107 101 105  CO2 22  --  21* 25 22 26 23   GLUCOSE 404*  --  247* 163* 180* 315* 177*  BUN 10  --  16 19 21 21 22   CREATININE 0.92  --  0.71 0.71 0.69 0.77 0.66  CALCIUM  9.1  --  9.4 9.7 9.1 9.4 8.8*  MG 1.7   < > 2.5* 2.2 2.2 2.2 2.0  PHOS 2.5  --  1.7* 2.6  --  2.6 2.8   < > = values in this interval not displayed.    CBC Recent Labs  Lab 05/04/24 0311 05/05/24 1611 05/06/24 0420  HGB 11.6* 12.0 10.5*  HCT 38.1 39.1 33.2*  WBC 11.9* 7.8 10.5  PLT 334 309 255     Anti-Coagulation Recent Labs  Lab 04/30/24 0050  INR 1.0          Allergies as of 05/06/2024       Reactions   Lorazepam     Makes anxiety worse Other Reaction(s): Makes anxiety worse   Lisinopril  Cough        Medication List     STOP taking these medications    azithromycin  250 MG tablet Commonly  known as: Zithromax  Z-Pak   fluconazole  150 MG tablet Commonly known as: Diflucan        TAKE these medications    acetaminophen  325 MG tablet Commonly known as: TYLENOL  Take 2 tablets (650 mg total) by mouth every 6 (six) hours as needed for mild pain (pain score 1-3) (or Fever >/= 101).   albuterol  108 (90 Base) MCG/ACT inhaler Commonly known as: VENTOLIN  HFA Inhale 2 puffs into the lungs every 6 (six) hours as needed for wheezing or shortness of breath.   albuterol  (2.5 MG/3ML) 0.083% nebulizer solution Commonly known as: PROVENTIL  Take 3 mLs (2.5 mg total) by nebulization every 6 (six) hours as needed for wheezing or shortness of breath.   amLODipine  5 MG tablet Commonly known as: NORVASC  Take 5 mg by mouth daily.   aspirin  81 MG chewable tablet Chew 81 mg by mouth daily.   azelastine  0.1 % nasal spray Commonly known as: ASTELIN  SMARTSIG:1-2 Spray(s) Both Nares Every 12 Hours PRN   cetirizine  10 MG tablet Commonly known as: ZYRTEC  Take 1 tablet (10 mg total) by mouth daily as needed for allergies. Home med.   ciclopirox 8 % solution Commonly known as: PENLAC Apply 1 Application topically daily.   clonazePAM  0.5 MG tablet Commonly known as: KLONOPIN  Take 0.5 mg by mouth in the morning, at noon, and at bedtime.   cloNIDine  0.1 mg/24hr patch Commonly known as: CATAPRES  - Dosed in mg/24 hr Place 0.1 mg onto the skin once a week.   [employer] Blood Glucose Meter w/Device Kit Use as directed to monitor blood glucose up to qid   docusate sodium  100 MG capsule Commonly known as: COLACE Take 2 capsules (200 mg total) by mouth daily as needed for  mild constipation.   famotidine  20 MG tablet Commonly known as: PEPCID  Take 1 tablet by mouth daily as needed for heartburn   fluticasone  50 MCG/ACT nasal spray Commonly known as: FLONASE  Place 1 spray into both nostrils daily as needed for allergies or rhinitis. Home med.   guaiFENesin  600 MG 12 hr tablet Commonly known as: MUCINEX  Take 600 mg by mouth 2 (two) times daily.   hydrOXYzine  25 MG capsule Commonly known as: VISTARIL  Take 25 mg by mouth every 6 (six) hours as needed.   ipratropium-albuterol  0.5-2.5 (3) MG/3ML Soln Commonly known as: DUONEB Take 3 mLs by nebulization every 6 (six) hours as needed (for shortness of breath).   Lantus  SoloStar 100 UNIT/ML Solostar Pen Generic drug: insulin  glargine Inject 40 Units into the skin at bedtime.   mirtazapine  45 MG tablet Commonly known as: REMERON  Take 45 mg by mouth at bedtime.   multivitamin with minerals Tabs tablet Take 1 tablet by mouth daily.   naloxone  4 MG/0.1ML Liqd nasal spray kit Commonly known as: NARCAN  Place 1 spray into the nose once as needed for up to 1 dose. Home med.   NovoLOG  FlexPen 100 UNIT/ML FlexPen Generic drug: insulin  aspart Inject 5-10 Units into the skin 3 (three) times daily with meals. Sliding scale mealtime insulin .   nystatin  ointment Commonly known as: MYCOSTATIN  Apply 1 Application topically 3 (three) times daily as needed. Home med.   oxyCODONE -acetaminophen  5-325 MG tablet Commonly known as: PERCOCET/ROXICET Take 1 tablet by mouth 3 (three) times daily as needed for pain.   PARoxetine  30 MG tablet Commonly known as: PAXIL  Take 30 mg by mouth daily.   polyethylene glycol powder 17 GM/SCOOP powder Commonly known as: GLYCOLAX /MIRALAX  Take 17 g by mouth daily as needed. Home  med.   predniSONE  10 MG tablet Commonly known as: DELTASONE  Take 4 tablets (40 mg total) by mouth daily for 3 days, THEN 3 tablets (30 mg total) daily for 3 days, THEN 2 tablets (20 mg total) daily for  3 days, THEN 1 tablet (10 mg total) daily for 3 days. Start taking on: May 06, 2024   Pulmicort  0.5 MG/2ML nebulizer solution Generic drug: budesonide  Take 0.5 mg by nebulization every 12 (twelve) hours.   roflumilast  500 MCG Tabs tablet Commonly known as: DALIRESP  Take 1 tablet (500 mcg total) by mouth daily.   terbinafine 1 % cream Commonly known as: LAMISIL Apply 1 application  topically 2 (two) times daily. To affected skin, for 1 week after resolution of rash. # 30 gm, 1 refill.   Trulicity 4.5 MG/0.5ML Soaj Generic drug: Dulaglutide Inject 4.5 mg into the skin once a week.   Vitamin D  (Ergocalciferol ) 1.25 MG (50000 UNIT) Caps capsule Commonly known as: DRISDOL  Take 50,000 Units by mouth every 7 (seven) days.            Disposition: Home  Discharged Condition: DEANDREA VANPELT has met maximum benefit of inpatient care and is medically stable and cleared for discharge.  Patient is pending follow up with Dr. Isaiah on 05/13/24 @ 10:45 am.     Time spent on disposition:  45 Minutes.     Signed: Inge Lecher, AGACNP-BC [facility] Prefer epic messenger for cross cover needs If after hours, please call E-link

## 2024-05-06 NOTE — Progress Notes (Signed)
 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, HTN, HLD, CHF, CAD s/p STEMI s/ stent 2009, DM type 2 w/ insulin  dependence, Hepatitis C, Former tobacco and cocaine use, Anxiety/Depression, and Obesity who presented to the ED with chief complaints of progressive shortness of breath  ED Course: Initial vital signs showed HR: 144 bpm, BP: 167/136 mmHg, RR: 30 breaths/min, Temp: 99.21F (37.6C), SpO2: 99%,patient was placed on the vent with Respiratory Support: Ventilator settings: Mode: PRVC, Set Rate: 16 bpm, Vt: 400 mL, PEEP: 5 cm H2O, Pressure Support: 10 cm H2O  Labs/Diagnostics: CBC: WBC: 10.0, Hgb: 12.2, Hct: 39.9,PLT: 303 Basic Metabolic Panel: Na+: 142, K+: 3.4 (low), Cl-: 102, CO2: 29, Glucose: 210 (high), BUN: 10, Creatinine: 0.86, Calcium : 10.0 Lactate: 2.4  ABG: Pending results for pO2, pCO2, pH, HCO3 Chest X-ray: Chronic left lateral basal sulcal blunting. Increased opacity in infrahilar areas, suggestive of atelectasis or aspiration changes.  Medications Administered: Cefepime  2g IV, Levofloxacin  750 mg IV, DuoNeb's, Valium  5mg  IV, Solumedrol 125mg  IV, 1L NS bolus. PCCM consulted for admission.   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 Cultures pending, broad spectrum antibiotics.  05/05/24- patient on 6L/min , on abx for pna, has exertnal urinary collection system. Viral workup negative.  05/06/24- patient with no overnight events.  Remains on 6L/min [place]  Interim History / Subjective:      Micro Data:  11/27: SARS-CoV-2 PCR> negative 11/27: Influenza PCR> negative 11/27: Blood culture x1> NGTD 11/28: Urine Culture>negative  11/28: MRSA PCR>>negative  11/28: Strep pneumo Ur Ag>>negative  11/28: Legionella Ur Ag>>negative  12/01: Respiratory Viral Panel>  Antimicrobials:   Antibiotics Given (last 72 hours)     Date/Time Action Medication Dose Rate   05/03/24 1421 New Bag/Given   ceFEPIme  (MAXIPIME ) 2 g in sodium chloride  0.9 % 100 mL IVPB 2 g 200 mL/hr   05/04/24 0028 New Bag/Given   ceFEPIme  (MAXIPIME ) 2 g in sodium chloride  0.9 % 100 mL IVPB 2 g 200 mL/hr   05/04/24 0113 New Bag/Given   azithromycin  (ZITHROMAX ) 500 mg in sodium chloride  0.9 % 250 mL IVPB 500 mg 250 mL/hr   05/04/24 0807 New Bag/Given   ceFEPIme  (MAXIPIME ) 2 g in sodium chloride  0.9 % 100 mL IVPB 2 g 200 mL/hr   05/04/24 1545 New Bag/Given   ceFEPIme  (MAXIPIME ) 2 g in sodium chloride  0.9 % 100 mL IVPB 2 g 200 mL/hr   05/05/24 0018 New Bag/Given   ceFEPIme  (MAXIPIME ) 2 g in sodium chloride  0.9 % 100 mL IVPB 2 g 200 mL/hr   05/05/24 0745 New Bag/Given   ceFEPIme  (MAXIPIME ) 2 g in sodium chloride  0.9 % 100 mL IVPB 2 g 200 mL/hr   05/05/24 1620 New Bag/Given   ceFEPIme  (MAXIPIME ) 2 g in sodium chloride  0.9 % 100 mL IVPB 2 g 200 mL/hr  OBJECTIVE  Blood pressure (!) 167/136, pulse (!) 144, temperature 99.6 F (37.6 C), temperature source Oral, resp. rate (!) 27, height 5' 0.98 (1.549 m), weight 97.5 kg, last menstrual period 03/06/2005, SpO2 99%.    Vent Mode: Other (Comment) Set Rate: [16 bmp] 16 bmp Vt Set: [400 mL] 400 mL PEEP: [5 cmH20] 5 cmH20 Pressure Support: [10 cmH20] 10 cmH20   Intake/Output Summary  (Last 24 hours) at 05/01/2024 0028 Last data filed at 05/01/2024 0028 Gross per 24 hours  Intake 100 ml  Output --  Net 100 ml   Filed Weights   04/30/24 2324  Weight: 97.5 kg   Physical Examination  GEN: Chronically-ill appearing female, NAD on home vent via chronic tracheostomy  HEENT: Supple, difficult to assess for JVD due to body habitus  HEART: NSR, s1s2, no m/r/g, 2+ radial/2+ distal pulses, no edema   LUNGS: Faint rhonchi throughout, even, non labored  ABDOMINAL: +BS x4, obese, non tender, non distended  EXTREMITIES: Normal bulk and tone, moves all extremities  NEURO: Awake and following commands, PERRLA SKIN: Intact, warm, no rashes lesion, or ulcer  Labs/imaging that I have personally reviewed  (right click and Reselect all Smart List Selections daily)   DG Chest 1 View Result Date: 05/01/2024 EXAM: 1 VIEW(S) XRAY OF THE CHEST 04/30/2024 11:49:00 PM COMPARISON: Portable chest 03/11/2024. CLINICAL HISTORY: SOB. FINDINGS: LINES, TUBES AND DEVICES: Tracheostomy cannula positioning is unchanged. Multiple overlying monitor wires. LUNGS AND PLEURA: Chronic left lateral basal sulcal blunting. Increased opacity of the infrahilar areas, which could reflect atelectasis or changes of recent aspiration. The remainder of the lungs is clear. No pleural effusion. No pneumothorax. HEART AND MEDIASTINUM: The heart size is normal. Stable mediastinum with aortic atherosclerosis. BONES AND SOFT TISSUES: No acute osseous abnormality. IMPRESSION: 1. Increased infrahilar opacities, which may represent atelectasis or recent aspiration changes. 2. Chronic left lateral basal sulcal blunting. Electronically signed by: Francis Quam MD 05/01/2024 12:03 AM EST RP Workstation: [id number]    Labs   CBC: Recent Labs  Lab 04/30/24 2326  WBC 10.0  NEUTROABS 6.2  HGB 12.2  HCT 39.9  MCV 84.4  PLT 303   Basic Metabolic Panel: Recent Labs  Lab 04/30/24 2326  NA 142  K 3.4*  CL 102  CO2 29   GLUCOSE 210*  BUN 10  CREATININE 0.86  CALCIUM  10.0   GFR: Estimated Creatinine Clearance: 72.5 mL/min (by C-G formula based on SCr of 0.86 mg/dL). Recent Labs  Lab 04/30/24 2326  WBC 10.0  LATICACIDVEN 2.4*   Liver Function Tests: Recent Labs  Lab 04/30/24 2326  AST 27  ALT 20  ALKPHOS 158*  BILITOT 0.3  PROT 8.9*  ALBUMIN  4.5   No results for input(s): LIPASE, AMYLASE in the last 168 hours. No results for input(s): AMMONIA in the last 168 hours.  ABG    Component Value Date/Time   PHART 7.38 03/12/2024 1053   PCO2ART 50 (H) 03/12/2024 1053   PO2ART 75 (L) 03/12/2024 1053   HCO3 29.6 (H) 03/12/2024 1053   ACIDBASEDEF 1.5 08/29/2023 0307   O2SAT 98 03/12/2024 1053     Coagulation Profile: No results for input(s): INR, PROTIME in the last 168 hours.  Cardiac Enzymes: No results for input(s): CKTOTAL, CKMB, CKMBINDEX, TROPONINI in the last 168 hours.  HbA1C: Hemoglobin A1C  Date/Time Value Ref Range Status  06/29/2013 04:02 AM 6.0 4.2 - 6.3 % Final    Comment:    The American Diabetes Association recommends that a  primary goal of therapy should be <7% and that physicians should reevaluate the treatment regimen in patients with HbA1c values consistently >8%.    Hgb A1c MFr Bld  Date/Time Value Ref Range Status  03/10/2024 04:17 AM 8.7 (H) 4.8 - 5.6 % Final    Comment:    (NOTE) Diagnosis of Diabetes The following HbA1c ranges recommended by the American Diabetes Association (ADA) may be used as an aid in the diagnosis of diabetes mellitus.  Hemoglobin             Suggested A1C NGSP%              Diagnosis  <5.7                   Non Diabetic  5.7-6.4                Pre-Diabetic  >6.4                   Diabetic  <7.0                   Glycemic control for                       adults with diabetes.    08/29/2023 01:01 AM 9.4 (H) 4.8 - 5.6 % Final    Comment:    (NOTE) Pre diabetes:          5.7%-6.4%  Diabetes:               >6.4%  Glycemic control for   <7.0% adults with diabetes     CBG: No results for input(s): GLUCAP in the last 168 hours.  Review of Systems:   Unable to be obtained secondary to the patient's intubated and sedated status.   Past Medical History  She, has a past medical history of Allergy, Anxiety, Asthma, CHF (congestive heart failure) (HCC), Cocaine abuse (HCC), COPD (chronic obstructive pulmonary disease) (HCC), Coronary artery disease, Emphysema/COPD, Hepatitis C, High cholesterol, Hypertension, Pneumothorax, and Tobacco abuse.   Surgical History    Past Surgical History:  Procedure Laterality Date   CARDIAC CATHETERIZATION     CHEST TUBE INSERTION     IR GASTROSTOMY TUBE MOD SED  10/31/2017   TRACHEOSTOMY TUBE PLACEMENT N/A 10/17/2017   Procedure: TRACHEOSTOMY; Surgeon: Milissa Hamming, MD; Location: [facility] ORS; Service: ENT; Laterality: N/A;   Social History   reports that she quit smoking about 6 years ago. Her smoking use included cigarettes. She started smoking about 47 years ago. She has a 4.1 pack-year smoking history. She has never used smokeless tobacco. She reports that she does not currently use drugs after having used the following drugs: Crack cocaine. She reports that she does not drink alcohol.   Family History   Her family history includes Asthma in her mother; Breast cancer (age of onset: 24) in her maternal aunt; CAD in her father; COPD in her sister; CVA in her father; Lung cancer in her mother; Stroke in her father.   Allergies Allergies  Allergen Reactions   Lorazepam      Makes anxiety worse  Other Reaction(s): Makes anxiety worse   Lisinopril  Cough   Amoxicillin      Patient has tolerated cephalosporins in the past     Home Medications  Prior to Admission medications   Medication Sig Start Date End Date Taking? Authorizing Provider  acetaminophen  (TYLENOL ) 325 MG tablet Take 2 tablets (650 mg total) by  mouth every 6 (six) hours as needed  for mild pain (pain score 1-3) (or Fever >/= 101). 04/24/23   Dorinda Drue DASEN, MD  albuterol  (PROVENTIL ) (2.5 MG/3ML) 0.083% nebulizer solution Take 3 mLs (2.5 mg total) by nebulization every 6 (six) hours as needed for wheezing or shortness of breath. 03/27/23   Tamea Dedra CROME, MD  albuterol  (VENTOLIN  HFA) 108 (90 Base) MCG/ACT inhaler Inhale 2 puffs into the lungs every 6 (six) hours as needed for wheezing or shortness of breath. 04/02/22   Parrett, Madelin RAMAN, NP  amLODipine  (NORVASC ) 5 MG tablet Take 5 mg by mouth daily. 08/27/21   [provider]  aspirin  81 MG chewable tablet Chew 81 mg by mouth daily.     [provider]  azelastine  (ASTELIN ) 0.1 % nasal spray SMARTSIG:1-2 Spray(s) Both Nares Every 12 Hours PRN 09/22/20   [provider]  azithromycin  (ZITHROMAX  Z-PAK) 250 MG tablet Take 500 mg by mouth daily for one day followed by 250 mg daily by mouth for four days 03/12/24   Shellia Inge BIRCH, NP  Blood Glucose Monitoring Suppl ([employer] BLOOD GLUCOSE METER) w/Device KIT Use as directed to monitor blood glucose up to qid 02/22/23   Alexander, Natalie, DO  cetirizine  (ZYRTEC ) 10 MG tablet Take 1 tablet (10 mg total) by mouth daily as needed for allergies. Home med. 12/20/23   Awanda City, MD  clonazePAM  (KLONOPIN ) 0.5 MG tablet Take 0.5 mg by mouth in the morning, at noon, and at bedtime.    [provider]  cloNIDine  (CATAPRES  - DOSED IN MG/24 HR) 0.1 mg/24hr patch Place 0.1 mg onto the skin once a week.    [provider]  docusate sodium  (COLACE) 100 MG capsule Take 2 capsules (200 mg total) by mouth daily as needed for mild constipation. 04/24/23   Dorinda Drue DASEN, MD  famotidine  (PEPCID ) 20 MG tablet Take 1 tablet by mouth daily as needed for heartburn 09/24/23   [provider]  fluconazole  (DIFLUCAN ) 150 MG tablet Take 1 tablet (150 mg total) by mouth once a week. 03/02/24   Brimage, Vondra, DO  fluticasone  (FLONASE ) 50 MCG/ACT nasal spray Place 1  spray into both nostrils daily as needed for allergies or rhinitis. Home med. 12/20/23   Awanda City, MD  guaiFENesin  (MUCINEX ) 600 MG 12 hr tablet Take 600 mg by mouth 2 (two) times daily.    [provider]  hydrOXYzine  (VISTARIL ) 25 MG capsule Take 25 mg by mouth every 6 (six) hours as needed. 10/01/22   [provider]  ipratropium-albuterol  (DUONEB) 0.5-2.5 (3) MG/3ML SOLN Take 3 mLs by nebulization every 6 (six) hours as needed (for shortness of breath). 06/17/23   Kasa, Kurian, MD  LANTUS  SOLOSTAR 100 UNIT/ML Solostar Pen Inject 40 Units into the skin at bedtime. 01/03/23   [provider]  mirtazapine  (REMERON ) 45 MG tablet Take 45 mg by mouth at bedtime. 01/07/24   [provider]  Multiple Vitamin (MULTIVITAMIN WITH MINERALS) TABS tablet Take 1 tablet by mouth daily. 12/20/23   Awanda City, MD  naloxone  (NARCAN ) nasal spray 4 mg/0.1 mL Place 1 spray into the nose once as needed for up to 1 dose. Home med. 12/20/23   Awanda City, MD  NOVOLOG  FLEXPEN 100 UNIT/ML FlexPen Inject 5-10 Units into the skin 3 (three) times daily with meals. Sliding scale mealtime insulin . 03/01/21   [provider]  nystatin  ointment (MYCOSTATIN ) Apply 1 Application topically 3 (three) times daily as needed. Home med.  12/20/23   Awanda City, MD  oxyCODONE -acetaminophen  (PERCOCET/ROXICET) 5-325 MG tablet Take 1 tablet by mouth 3 (three) times daily as needed for pain. 09/07/21   [provider]  PARoxetine  (PAXIL ) 30 MG tablet Take 30 mg by mouth daily. 09/19/23   [provider]  polyethylene glycol powder (GLYCOLAX /MIRALAX ) 17 GM/SCOOP powder Take 17 g by mouth daily as needed. Home med. 12/20/23   Awanda City, MD  PULMICORT  0.5 MG/2ML nebulizer solution Take 0.5 mg by nebulization every 12 (twelve) hours. Patient not taking: Reported on 03/19/2024 02/13/21   [provider]  roflumilast  (DALIRESP ) 500 MCG TABS tablet Take 1 tablet (500 mcg total) by mouth daily.  03/19/24   Kasa, Kurian, MD  terbinafine (LAMISIL) 1 % cream Apply 1 application  topically 2 (two) times daily. To affected skin, for 1 week after resolution of rash. # 30 gm, 1 refill. Patient not taking: Reported on 03/19/2024    [provider]  TRULICITY 4.5 MG/0.5ML SOPN Inject 4.5 mg into the skin once a week. 09/28/21   [provider]  Vitamin D , Ergocalciferol , (DRISDOL ) 1.25 MG (50000 UNIT) CAPS capsule Take 50,000 Units by mouth every 7 (seven) days.    [provider]  fluconazole  (DIFLUCAN ) 150 MG tablet Take 1 tablet day 1 repeat in 1 week 11/15/20   Moye, Virginia , MD  Scheduled Meds:  amLODipine   5 mg Oral Daily   aspirin   81 mg Oral Daily   budesonide  (PULMICORT ) nebulizer solution  0.25 mg Nebulization BID   Chlorhexidine  Gluconate Cloth  6 each Topical Daily   clonazePAM   0.5 mg Oral TID   enoxaparin  (LOVENOX ) injection  40 mg Subcutaneous Q24H   insulin  aspart  0-20 Units Subcutaneous TID WC   insulin  aspart  0-5 Units Subcutaneous QHS   insulin  glargine-yfgn  20 Units Subcutaneous BID   ipratropium-albuterol   3 mL Nebulization TID   mirtazapine   45 mg Oral QHS   multivitamin with minerals  1 tablet Oral Daily   mouth rinse  15 mL Mouth Rinse Q2H   pantoprazole   40 mg Oral Daily   PARoxetine   30 mg Oral Daily   Continuous Infusions:   PRN Meds:.docusate sodium , hydrOXYzine , ipratropium-albuterol , morphine  injection, mouth rinse, oxyCODONE -acetaminophen , polyethylene glycol, sodium chloride , traZODone   Active Hospital Problem list   See systems below  Assessment & Plan:   #Anxiety #Depression - Continue outpatient clonazepam , hydroxyzine , paroxetine , and mirtazapine   - Prn morphine  and oxycodone  for pain management   #HFpEF #Hypertension Hx: Hyperlipidemia and CAD Echo 03/09/24: LV function is preserved (EF60-65%). - Continuous telemetry monitoring  - Continue amlodipine  and aspirin    #Acute on Chronic Hypoxic and Hypercapnic  Respiratory Failure d/t COPD exacerbation w/ potential PNA #Mucus Plugging vs Aspiration Hx: COPD/Emphysema/Asthma with chronic tracheostomy and home vent Pseudomonas and Acinetobacter PNA - Continue Home vent Settings - Plateau pressures less than 30 cm H20 - Wean PEEP & FiO2 as tolerated, maintain SpO2 > 88% - Head of bed elevated 30 degrees, VAP protocol in place  - Intermittent chest x-ray & ABG PRN - Ensure adequate pulmonary hygiene  - Continue steroids  - Continue scheduled bronchodilator therapy and nebulized steroids  - Respiratory viral panel   #Sepsis due to suspected pneumonia - Trend WBC and monitor fever curve  - Follow cultures  - Continue cefepime  and azithromycin      #Type 2 Diabetes Mellitus, insulin  dependent - CBG's ac/hs - SSI  - Target CBG range 140 to 180   Critical care  provider statement:   Total critical care time: 33 minutes   Performed by: Parris MD   Critical care time was exclusive of separately billable procedures and treating other patients.   Critical care was necessary to treat or prevent imminent or life-threatening deterioration.   Critical care was time spent personally by me on the following activities: development of treatment plan with patient and/or surrogate as well as nursing, discussions with consultants, evaluation of patient's response to treatment, examination of patient, obtaining history from patient or surrogate, ordering and performing treatments and interventions, ordering and review of laboratory studies, ordering and review of radiographic studies, pulse oximetry and re-evaluation of patient's condition.    Mccormick Macon, M.D.  Pulmonary & Critical Care Medicine

## 2024-05-13 ENCOUNTER — Ambulatory Visit: Admitting: Internal Medicine

## 2024-05-13 ENCOUNTER — Encounter: Payer: Self-pay | Admitting: Internal Medicine

## 2024-05-13 VITALS — BP 130/80 | HR 70 | Temp 98.3°F | Ht 61.0 in | Wt 153.0 lb

## 2024-05-13 DIAGNOSIS — R0689 Other abnormalities of breathing: Secondary | ICD-10-CM

## 2024-05-13 DIAGNOSIS — R053 Chronic cough: Secondary | ICD-10-CM

## 2024-05-13 DIAGNOSIS — J9611 Chronic respiratory failure with hypoxia: Secondary | ICD-10-CM

## 2024-05-13 MED ORDER — PREDNISONE 10 MG PO TABS: 10.0000 mg | ORAL_TABLET | Freq: Every day | ORAL | 1 refills | Status: DC

## 2024-05-13 NOTE — Patient Instructions (Signed)
 Continue ventilator as prescribed Continue oxygen as prescribed Nebulizers as prescribed Prednisone  as prescribed Continue Daliresp   Avoid Allergens and Irritants Avoid secondhand smoke Avoid SICK contacts Recommend  Masking  when appropriate Recommend Keep up-to-date with vaccinations

## 2024-05-13 NOTE — Progress Notes (Signed)
 Name: Michelle [name] MRN: [id number] DOB: 1961-02-06    Synopsis 63 year old female, former smoker.  Last medical history significant for severe COPD with chronic tracheostomy, chronic respiratory failure, asthma, atypical pneumonia/Pseudomonas respiratory infection, history of pneumothorax, tobacco abuse, cocaine abuse, CHF, coronary artery disease, hepatitis C, diabetes mellitus type 2.  Patient of Dr. Isaiah, last seen in office December 08, 2018.  Hospital admissions for COPD exacerbation and multi focal pneumonia.  [name] readmitted in March for hospital-acquired pneumonia and sepsis. Recurrent bouts of pneumonia Recent admission aspiration pneumonia and COPD exacerbation Pseudomonas (Intermediate to fluoroquinolones) and mixed gram positive/gram negative organisms.  Hospital course 08/28/2019-09/03/2019: Admitted with fevers Treated for tracheitis vs healthcare associated pneumonia with aztreonam , Flagyl , vancomycin  She was placed on vent for full support and started on bronchodilators.  She had full bed physiotherapy and chest PT.   Inpatient swallow study however showed no concern for aspiration at [facility], Patient eating foods by mouth without difficulty Required tracheostomy 10/2017 VENT DEPENDENT  CHIEF COMPLAINT:  Follow-up assessment for respiratory failure Follow-up assessment for COPD Follow-up assessment for tracheostomy vent dependent respiratory failure   HISTORY OF PRESENT ILLNESS: Recent hospital admission for COPD exacerbation Patient returns today for short follow-up from hospitalization She is alert awake no respiratory distress feels much better Patient continues ventilator therapy No significant issues at this time Currently on prednisone  taper Plan to give prednisone  as needed   Yes Mode: ventilator  home vent settings  Vent Mode: PRVC FiO2 (%): 40 % S RR: 16 S VT: 450 mL PEEP: 5 cm H20   CAD: Had MI in 2009, though prior records indicate in setting of cocaine use.  Continued home ASA and statin.     Chronic Hypoxic resp failure due to COPD -Patient benefits from oxygen therapy 2L [place]  -recommend using oxygen as prescribed -patient needs this for survival      PAST MEDICAL HISTORY :   has a past medical history of Allergy, Anxiety, Asthma, CHF (congestive heart failure) (HCC), Cocaine abuse (HCC), COPD (chronic obstructive pulmonary disease) (HCC), Coronary artery disease, Emphysema/COPD, Hepatitis C, High cholesterol, Hypertension, Pneumothorax, and Tobacco abuse.  has a past surgical history that includes Cardiac catheterization; Chest tube insertion; Tracheostomy tube placement (N/A, 10/17/2017); and IR GASTROSTOMY TUBE MOD SED (10/31/2017). Prior to Admission medications   Medication Sig Start Date End Date Taking? Authorizing Provider  AMBULATORY NON FORMULARY MEDICATION Medication Name: Pulse oximeter DME:[facility] DX J44.9....G03.78 01/08/18   Exzavier Ruderman, MD  aspirin  81 MG chewable tablet Chew 81 mg by mouth at bedtime.     [provider]  budesonide  (PULMICORT ) 0.25 MG/2ML nebulizer solution Take 2 mLs (0.25 mg total) by nebulization 2 (two) times daily. 12/31/17   Conforti, John, DO  cetirizine  (ZYRTEC ) 10 MG tablet Place 10 mg into feeding tube daily.     [provider]  clonazePAM  (KLONOPIN ) 0.5 MG tablet Place 1 tablet (0.5 mg total) into feeding tube daily. Patient taking differently: Place 1 mg into feeding tube 2 (two) times daily. (may also take as needed for anxiety) 01/07/18   Hank Walling, MD  famotidine  (PEPCID ) 20 MG tablet Place 1 tablet (20 mg total) into feeding tube daily. 12/31/17   Conforti, John, DO  fentaNYL  (DURAGESIC  - DOSED MCG/HR) 25 MCG/HR patch Place 1 patch (25 mcg total) onto the skin every 3 (three) days. 01/07/18   Dexter Sauser, MD  fluticasone  (FLONASE ) 50 MCG/ACT nasal spray Place 1 spray into both nostrils daily.  [provider]  ipratropium-albuterol  (DUONEB) 0.5-2.5 (3) MG/3ML SOLN Take 3  mLs by nebulization every 6 (six) hours. Patient taking differently: Take 3 mLs by nebulization every 6 (six) hours as needed (for shortness of breath).  12/31/17   Conforti, John, DO  mirtazapine  (REMERON ) 30 MG tablet Take 30 mg by mouth at bedtime.    [provider]  oxyCODONE -acetaminophen  (PERCOCET/ROXICET) 5-325 MG tablet Place 1 tablet into feeding tube every 8 (eight) hours as needed for severe pain. 01/07/18   Vasil Juhasz, MD  PARoxetine  (PAXIL ) 20 MG tablet Place 1 tablet (20 mg total) into feeding tube at bedtime. 12/31/17   Conforti, John, DO  polyethylene glycol (MIRALAX  / GLYCOLAX ) 17 g packet Place 17 g into feeding tube daily.    [provider]  predniSONE  (DELTASONE ) 20 MG tablet Take 2 tablets (40 mg total) by mouth daily with breakfast. 10/23/18   Blakeney, Dana G, NP  QUEtiapine  (SEROQUEL ) 25 MG tablet Place 1 tablet (25 mg total) into feeding tube at bedtime. 12/31/17   Conforti, John, DO  senna (SENOKOT) 8.6 MG TABS tablet Place 1-2 tablets into feeding tube at bedtime.     [provider]  simvastatin  (ZOCOR ) 20 MG tablet Take 40 mg by mouth every evening.     [provider]   Allergies  Allergen Reactions   Lorazepam      Makes anxiety worse  Other Reaction(s): Makes anxiety worse   Lisinopril  Cough    FAMILY HISTORY:  family history includes Asthma in her mother; Breast cancer (age of onset: 65) in her maternal aunt; CAD in her father; COPD in her sister; CVA in her father; Lung cancer in her mother; Stroke in her father. SOCIAL HISTORY:  reports that she quit smoking about 6 years ago. Her smoking use included cigarettes. She started smoking about 47 years ago. She has a 4.1 pack-year smoking history. She has never used smokeless tobacco. She reports that she does not currently use drugs after having used the following drugs: Crack cocaine. She reports that she does not drink alcohol.   CAN EAT WITH TRACH IN PLACE Alert awake  following commands No wheezing S1-S2 no murmurs Pulses intact No focal deficits   MEDICATIONS: I have reviewed all medications and confirmed regimen as documented    ASSESSMENT AND PLAN SYNOPSIS  63 year old obese African-American female with severe end-stage COPD and end-stage chronic hypoxic and hypercapnic respiratory failure with severe debilitating respiratory disease status post tracheostomy chronic vent dependent with previous history of recurrent bouts of pneumonia and aspiration pneumonia and COVID-19 pneumonia  End-stage hypercapnic respiratory failure with COPD Recurrent bouts of COPD exacerbation started on Daliresp  Currently on prednisone  taper will provide prednisone  as needed Patient needs ventilator and oxygen therapy for survival home vent settings  Vent Mode: PRVC FiO2 (%): 40 % S RR: 16 S VT: 450 mL PEEP: 5 cm H20    End-stage hypoxic respiratory failure Continue oxygen as prescribed Patient needs this for survival   End-stage COPD/respiratory sufficiency Continue nebulized therapy with Pulmicort  nebs twice daily DuoNebs every 4-6 hours Albuterol  nebulizers as needed Recommend pulmonary rehab referral Daliresp  500 mg diarrhea has improved  Recurrent bouts of COPD exacerbation On Daliresp  Prednisone  back up     MEDICATION ADJUSTMENTS/LABS AND TESTS ORDERED: Continue nebulized therapy as prescribed Continue ventilator therapy as prescribed Continue oxygen as prescribed Daliresp  500 mg daily Pulmonary rehab referral Back up prednisone    CURRENT MEDICATIONS REVIEWED AT LENGTH WITH PATIENT TODAY   Patient  satisfied with Plan of action and management. All questions answered   Follow up 3 months   I spent a total of 45 minutes dedicated to the care of this patient on the date of this encounter to include pre-visit review of records, face-to-face time with the patient discussing conditions above, post visit ordering of testing, clinical  documentation with the electronic health record, making appropriate referrals as documented, and communicating necessary information to the patient's healthcare team.    The Patient requires high complexity decision making for assessment and support, frequent evaluation and titration of therapies, application of advanced monitoring technologies and extensive interpretation of multiple databases.  Patient satisfied with Plan of action and management. All questions answered    Nickolas Alm Cellar, M.D.  [facility]  Medical Director [facility]

## 2024-05-15 ENCOUNTER — Encounter: Attending: Internal Medicine | Admitting: *Deleted

## 2024-05-15 DIAGNOSIS — J9610 Chronic respiratory failure, unspecified whether with hypoxia or hypercapnia: Secondary | ICD-10-CM | POA: Insufficient documentation

## 2024-05-15 DIAGNOSIS — J449 Chronic obstructive pulmonary disease, unspecified: Secondary | ICD-10-CM

## 2024-05-15 DIAGNOSIS — Z930 Tracheostomy status: Secondary | ICD-10-CM | POA: Insufficient documentation

## 2024-05-15 DIAGNOSIS — Z9911 Dependence on respirator [ventilator] status: Secondary | ICD-10-CM | POA: Insufficient documentation

## 2024-05-15 NOTE — Progress Notes (Signed)
 Initial phone call completed. Diagnosis can be found in [facility] 10/16. EP Orientation scheduled for Wednesday 12/17 at 1:45.

## 2024-05-20 ENCOUNTER — Encounter

## 2024-05-20 VITALS — Ht 61.0 in | Wt 116.3 lb

## 2024-05-20 DIAGNOSIS — J449 Chronic obstructive pulmonary disease, unspecified: Secondary | ICD-10-CM | POA: Diagnosis present

## 2024-05-20 DIAGNOSIS — Z930 Tracheostomy status: Secondary | ICD-10-CM | POA: Diagnosis not present

## 2024-05-20 DIAGNOSIS — Z9911 Dependence on respirator [ventilator] status: Secondary | ICD-10-CM | POA: Diagnosis not present

## 2024-05-20 DIAGNOSIS — J9610 Chronic respiratory failure, unspecified whether with hypoxia or hypercapnia: Secondary | ICD-10-CM | POA: Diagnosis not present

## 2024-05-20 NOTE — Progress Notes (Signed)
 Pulmonary Individual Treatment Plan  Patient Details  Name: Michelle [name] MRN: [id number] Date of Birth: February 16, 1961 Referring Provider:   Flowsheet Row Pulmonary Rehab from 05/20/2024 in [facility] Cardiac and Pulmonary Rehab  Referring Provider Dr. Hubert Cellar    Initial Encounter Date:  Flowsheet Row Pulmonary Rehab from 05/20/2024 in [facility] Cardiac and Pulmonary Rehab  Date 05/20/24    Visit Diagnosis: Chronic obstructive pulmonary disease, unspecified COPD type (HCC)  Patient's Home Medications on Admission: Current Medications[1]  Past Medical History: Past Medical History:  Diagnosis Date   Allergy    Anxiety    Asthma    CHF (congestive heart failure) (HCC)    Cocaine abuse (HCC)    COPD (chronic obstructive pulmonary disease) (HCC)    Coronary artery disease    Emphysema/COPD    Hepatitis C    High cholesterol    Hypertension    Pneumothorax    Tobacco abuse     Tobacco Use: Tobacco Use History[2]  Labs: Review Flowsheet  More data exists      Latest Ref Rng & Units 08/29/2023 12/15/2023 03/08/2024 03/10/2024 03/12/2024  Labs for ITP Cardiac and Pulmonary Rehab  Cholestrol 0 - 200 mg/dL - - 808  - -  LDL (calc) 0 - 99 mg/dL - - 883  - -  HDL-C >59 mg/dL - - 45  - -  Trlycerides <150 mg/dL - 881  850  - -  Hemoglobin A1c 4.8 - 5.6 % 9.4  - - 8.7  -  PH, Arterial 7.35 - 7.45 - - 7.42  - 7.38   PCO2 arterial 32 - 48 mmHg - - 53  - 50   Bicarbonate 20.0 - 28.0 mmol/L 24.8  25.7  - 34.4  - 29.6   Acid-base deficit 0.0 - 2.0 mmol/L 1.5  1.2  - - - -  O2 Saturation % 97.5  97.6  - 94.6  - 98     Details       Multiple values from one day are sorted in reverse-chronological order          Pulmonary Assessment Scores:   UCSD: Self-administered rating of dyspnea associated with activities of daily living (ADLs) 6-point scale (0 = not at all to 5 = maximal or unable to do because of breathlessness)  Scoring Scores range from 0 to 120.  Minimally important  difference is 5 units  CAT: CAT can identify the health impairment of COPD patients and is better correlated with disease progression.  CAT has a scoring range of zero to 40. The CAT score is classified into four groups of low (less than 10), medium (10 - 20), high (21-30) and very high (31-40) based on the impact level of disease on health status. A CAT score over 10 suggests significant symptoms.  A worsening CAT score could be explained by an exacerbation, poor medication adherence, poor inhaler technique, or progression of COPD or comorbid conditions.  CAT MCID is 2 points  mMRC: mMRC (Modified Medical Research Council) Dyspnea Scale is used to assess the degree of baseline functional disability in patients of respiratory disease due to dyspnea. No minimal important difference is established. A decrease in score of 1 point or greater is considered a positive change.   Pulmonary Function Assessment:   Exercise Target Goals: Exercise Program Goal: Individual exercise prescription set using results from initial 6 min walk test and THRR while considering  patients activity barriers and safety.   Exercise Prescription Goal: Initial exercise  prescription builds to 30-45 minutes a day of aerobic activity, 2-3 days per week.  Home exercise guidelines will be given to patient during program as part of exercise prescription that the participant will acknowledge.  Education: Aerobic Exercise: - Group verbal and visual presentation on the components of exercise prescription. Introduces F.I.T.T principle from ACSM for exercise prescriptions.  Reviews F.I.T.T. principles of aerobic exercise including progression. Written material provided at class time.   Education: Resistance Exercise: - Group verbal and visual presentation on the components of exercise prescription. Introduces F.I.T.T principle from ACSM for exercise prescriptions  Reviews F.I.T.T. principles of resistance exercise including  progression. Written material provided at class time.    Education: Exercise & Equipment Safety: - Individual verbal instruction and demonstration of equipment use and safety with use of the equipment. Flowsheet Row Pulmonary Rehab from 05/20/2024 in [facility] Cardiac and Pulmonary Rehab  Date 05/20/24  Educator [facility]  Instruction Review Code 1- Verbalizes Understanding    Education: Exercise Physiology & General Exercise Guidelines: - Group verbal and written instruction with models to review the exercise physiology of the cardiovascular system and associated critical values. Provides general exercise guidelines with specific guidelines to those with heart or lung disease.    Education: Flexibility, Balance, Mind/Body Relaxation: - Group verbal and visual presentation with interactive activity on the components of exercise prescription. Introduces F.I.T.T principle from ACSM for exercise prescriptions. Reviews F.I.T.T. principles of flexibility and balance exercise training including progression. Also discusses the mind body connection.  Reviews various relaxation techniques to help reduce and manage stress (i.e. Deep breathing, progressive muscle relaxation, and visualization). Balance handout provided to take home. Written material provided at class time.   Activity Barriers & Risk Stratification:  Activity Barriers & Cardiac Risk Stratification - 05/20/24 1536       Activity Barriers & Cardiac Risk Stratification   Activity Barriers Balance Concerns;Muscular Weakness;Deconditioning;Shortness of Breath;History of Falls;Other (comment);Assistive Device    Comments Trach and Vent dependent, wheelchair dependent          6 Minute Walk:  6 Minute Walk     Row Name 05/20/24 1533         6 Minute Walk   Phase Initial     Distance 369 feet     Walk Time 6 minutes     # of Rest Breaks 0     MPH 0.7     METS 2.18     RPE 11     Perceived Dyspnea  1     VO2 Peak 7.6     Symptoms No      Resting HR 104 bpm     Resting BP 108/74     Resting Oxygen Saturation  97 %     Exercise Oxygen Saturation  during 6 min walk 97 %     Max Ex. HR 104 bpm     Max Ex. BP 144/76     2 Minute Post BP 132/76       Interval HR   1 Minute HR 107     2 Minute HR 109     3 Minute HR 110     4 Minute HR 112     5 Minute HR 118     6 Minute HR 120     2 Minute Post HR 108     Interval Heart Rate? Yes       Interval Oxygen   Interval Oxygen? Yes     Baseline Oxygen Saturation %  97 %     1 Minute Oxygen Saturation % 97 %     1 Minute Liters of Oxygen 6 L     2 Minute Oxygen Saturation % 97 %     2 Minute Liters of Oxygen 6 L     3 Minute Oxygen Saturation % 97 %     3 Minute Liters of Oxygen 6 L     4 Minute Oxygen Saturation % 98 %     4 Minute Liters of Oxygen 6 L     5 Minute Oxygen Saturation % 97 %     5 Minute Liters of Oxygen 6 L     6 Minute Oxygen Saturation % 97 %     6 Minute Liters of Oxygen 6 L     2 Minute Post Oxygen Saturation % 97 %     2 Minute Post Liters of Oxygen 6 L       Oxygen Initial Assessment:   Oxygen Re-Evaluation:   Oxygen Discharge (Final Oxygen Re-Evaluation):   Initial Exercise Prescription:  Initial Exercise Prescription - 05/20/24 1500       Date of Initial Exercise RX and Referring Provider   Date 05/20/24    Referring Provider Dr. Kurain Kasa      Oxygen   Maintain Oxygen Saturation 88% or higher      NuStep   Level 1    SPM 80    Minutes 15    METs 2.18      T5 Nustep   Level 1    SPM 80    Minutes 15    METs 2.18      Prescription Details   Duration Progress to 30 minutes of continuous aerobic without signs/symptoms of physical distress      Intensity   THRR 40-80% of Max Heartrate 125-147    Ratings of Perceived Exertion 11-13    Perceived Dyspnea 0-4      Progression   Progression Continue to progress workloads to maintain intensity without signs/symptoms of physical distress.      Resistance Training    Training Prescription Yes    Weight 2lb    Reps 10-15          Perform Capillary Blood Glucose checks as needed.  Exercise Prescription Changes:   Exercise Prescription Changes     Row Name 05/20/24 1500             Response to Exercise   Blood Pressure (Admit) 108/74       Blood Pressure (Exercise) 144/76       Blood Pressure (Exit) 132/76       Heart Rate (Admit) 104 bpm       Heart Rate (Exercise) 121 bpm       Heart Rate (Exit) 104 bpm       Oxygen Saturation (Admit) 97 %       Oxygen Saturation (Exercise) 97 %       Oxygen Saturation (Exit) 91 %       Rating of Perceived Exertion (Exercise) 11       Perceived Dyspnea (Exercise) 1       Symptoms none       Comments 6 minutes T4 nustep test results          Exercise Comments:   Exercise Goals and Review:   Exercise Goals     Row Name 05/20/24 1541             Exercise  Goals   Increase Physical Activity Yes       Intervention Develop an individualized exercise prescription for aerobic and resistive training based on initial evaluation findings, risk stratification, comorbidities and participant's personal goals.;Provide advice, education, support and counseling about physical activity/exercise needs.       Expected Outcomes Short Term: Attend rehab on a regular basis to increase amount of physical activity.;Long Term: Add in home exercise to make exercise part of routine and to increase amount of physical activity.;Long Term: Exercising regularly at least 3-5 days a week.       Increase Strength and Stamina Yes       Intervention Provide advice, education, support and counseling about physical activity/exercise needs.;Develop an individualized exercise prescription for aerobic and resistive training based on initial evaluation findings, risk stratification, comorbidities and participant's personal goals.       Expected Outcomes Short Term: Increase workloads from initial exercise prescription for resistance,  speed, and METs.;Long Term: Improve cardiorespiratory fitness, muscular endurance and strength as measured by increased METs and functional capacity ( );Short Term: Perform resistance training exercises routinely during rehab and add in resistance training at home       Able to understand and use rate of perceived exertion (RPE) scale Yes       Intervention Provide education and explanation on how to use RPE scale       Expected Outcomes Short Term: Able to use RPE daily in rehab to express subjective intensity level;Long Term:  Able to use RPE to guide intensity level when exercising independently       Able to understand and use Dyspnea scale Yes       Intervention Provide education and explanation on how to use Dyspnea scale       Expected Outcomes Short Term: Able to use Dyspnea scale daily in rehab to express subjective sense of shortness of breath during exertion;Long Term: Able to use Dyspnea scale to guide intensity level when exercising independently       Knowledge and understanding of Target Heart Rate Range (THRR) Yes       Intervention Provide education and explanation of THRR including how the numbers were predicted and where they are located for reference       Expected Outcomes Short Term: Able to use daily as guideline for intensity in rehab;Long Term: Able to use THRR to govern intensity when exercising independently;Short Term: Able to state/look up THRR       Able to check pulse independently Yes       Intervention Provide education and demonstration on how to check pulse in carotid and radial arteries.;Review the importance of being able to check your own pulse for safety during independent exercise       Expected Outcomes Short Term: Able to explain why pulse checking is important during independent exercise;Long Term: Able to check pulse independently and accurately       Understanding of Exercise Prescription Yes       Intervention Provide education, explanation, and written  materials on patient's individual exercise prescription       Expected Outcomes Long Term: Able to explain home exercise prescription to exercise independently;Short Term: Able to explain program exercise prescription          Exercise Goals Re-Evaluation :   Discharge Exercise Prescription (Final Exercise Prescription Changes):  Exercise Prescription Changes - 05/20/24 1500       Response to Exercise   Blood Pressure (Admit) 108/74    Blood Pressure (  Exercise) 144/76    Blood Pressure (Exit) 132/76    Heart Rate (Admit) 104 bpm    Heart Rate (Exercise) 121 bpm    Heart Rate (Exit) 104 bpm    Oxygen Saturation (Admit) 97 %    Oxygen Saturation (Exercise) 97 %    Oxygen Saturation (Exit) 91 %    Rating of Perceived Exertion (Exercise) 11    Perceived Dyspnea (Exercise) 1    Symptoms none    Comments 6 minutes T4 nustep test results          Nutrition:  Target Goals: Understanding of nutrition guidelines, daily intake of sodium 1500mg , cholesterol 200mg , calories 30% from fat and 7% or less from saturated fats, daily to have 5 or more servings of fruits and vegetables.  Education: Nutrition 1 -Group instruction provided by verbal, written material, interactive activities, discussions, models, and posters to present general guidelines for heart healthy nutrition including macronutrients, label reading, and promoting whole foods over processed counterparts. Education serves as pensions consultant of discussion of heart healthy eating for all. Written material provided at class time.     Education: Nutrition 2 -Group instruction provided by verbal, written material, interactive activities, discussions, models, and posters to present general guidelines for heart healthy nutrition including sodium, cholesterol, and saturated fat. Providing guidance of habit forming to improve blood pressure, cholesterol, and body weight. Written material provided at class time.     Biometrics:  Pre  Biometrics - 05/20/24 1542       Pre Biometrics   Height 5' 1 (1.549 m)    Weight 116 lb 4.8 oz (52.8 kg)    BMI (Calculated) 21.99    Single Leg Stand 0 seconds   DNA          Nutrition Therapy Plan and Nutrition Goals:   Nutrition Assessments:  MEDIFICTS Score Key: >=70 Need to make dietary changes  40-70 Heart Healthy Diet <= 40 Therapeutic Level Cholesterol Diet   Picture Your Plate Scores: <59 Unhealthy dietary pattern with much room for improvement. 41-50 Dietary pattern unlikely to meet recommendations for good health and room for improvement. 51-60 More healthful dietary pattern, with some room for improvement.  >60 Healthy dietary pattern, although there may be some specific behaviors that could be improved.   Nutrition Goals Re-Evaluation:   Nutrition Goals Discharge (Final Nutrition Goals Re-Evaluation):   Psychosocial: Target Goals: Acknowledge presence or absence of significant depression and/or stress, maximize coping skills, provide positive support system. Participant is able to verbalize types and ability to use techniques and skills needed for reducing stress and depression.   Education: Stress, Anxiety, and Depression - Group verbal and visual presentation to define topics covered.  Reviews how body is impacted by stress, anxiety, and depression.  Also discusses healthy ways to reduce stress and to treat/manage anxiety and depression.  Written material provided at class time.   Education: Sleep Hygiene -Provides group verbal and written instruction about how sleep can affect your health.  Define sleep hygiene, discuss sleep cycles and impact of sleep habits. Review good sleep hygiene tips.    Initial Review & Psychosocial Screening:  Initial Psych Review & Screening - 05/15/24 1441       Initial Review   Current issues with Current Stress Concerns;Current Anxiety/Panic      Family Dynamics   Good Support System? Yes      Barriers    Psychosocial barriers to participate in program There are no identifiable barriers or psychosocial needs.;The patient should  benefit from training in stress management and relaxation.      Screening Interventions   Interventions Encouraged to exercise;Provide feedback about the scores to participant;To provide support and resources with identified psychosocial needs    Expected Outcomes Short Term goal: Utilizing psychosocial counselor, staff and physician to assist with identification of specific Stressors or current issues interfering with healing process. Setting desired goal for each stressor or current issue identified.;Long Term Goal: Stressors or current issues are controlled or eliminated.;Short Term goal: Identification and review with participant of any Quality of Life or Depression concerns found by scoring the questionnaire.;Long Term goal: The participant improves quality of Life and PHQ9 Scores as seen by post scores and/or verbalization of changes          Quality of Life Scores:  Scores of 19 and below usually indicate a poorer quality of life in these areas.  A difference of  2-3 points is a clinically meaningful difference.  A difference of 2-3 points in the total score of the Quality of Life Index has been associated with significant improvement in overall quality of life, self-image, physical symptoms, and general health in studies assessing change in quality of life.  PHQ-9: Review Flowsheet       05/20/2024  Depression screen PHQ 2/9  Decreased Interest 2  Down, Depressed, Hopeless 3  PHQ - 2 Score 5  Altered sleeping 0  Tired, decreased energy 3  Change in appetite 2  Feeling bad or failure about yourself  2  Trouble concentrating 1  Moving slowly or fidgety/restless 0  Suicidal thoughts 0  PHQ-9 Score 13  Difficult doing work/chores Not difficult at all   Interpretation of Total Score  Total Score Depression Severity:  1-4 = Minimal depression, 5-9 = Mild  depression, 10-14 = Moderate depression, 15-19 = Moderately severe depression, 20-27 = Severe depression   Psychosocial Evaluation and Intervention:  Psychosocial Evaluation - 05/15/24 1441       Psychosocial Evaluation & Interventions   Interventions Relaxation education;Encouraged to exercise with the program and follow exercise prescription;Stress management education    Comments Ms. Mckendry is coming to pulmonary rehab with COPD. Her daughter discussed on the phone orientation that Ms. Rens struggles with anxiety and her Klonopin  usually helps with the symptoms. She is trach/vent dependent. Since this last hospitalization, they note that her weakness has increased. They were told that insurance would not cover more than one physical therapy session given her status of independence and were told to give Pulmonary Rehab a try. She has a good support system in her daughter and also has nurses that help her.    Expected Outcomes Short: attend pulmonary rehab for education and exercise Long: develop and maintain positive self care habits    Continue Psychosocial Services  Follow up required by staff          Psychosocial Re-Evaluation:   Psychosocial Discharge (Final Psychosocial Re-Evaluation):   Education: Education Goals: Education classes will be provided on a weekly basis, covering required topics. Participant will state understanding/return demonstration of topics presented.  Learning Barriers/Preferences:  Learning Barriers/Preferences - 05/15/24 1440       Learning Barriers/Preferences   Learning Barriers None    Learning Preferences Individual Instruction          General Pulmonary Education Topics:  Infection Prevention: - Provides verbal and written material to individual with discussion of infection control including proper hand washing and proper equipment cleaning during exercise session. Flowsheet Row Pulmonary Rehab from  05/20/2024 in [facility] Cardiac and Pulmonary  Rehab  Date 05/20/24  Educator [facility]  Instruction Review Code 1- Verbalizes Understanding    Falls Prevention: - Provides verbal and written material to individual with discussion of falls prevention and safety. Flowsheet Row Pulmonary Rehab from 05/20/2024 in [facility] Cardiac and Pulmonary Rehab  Date 05/20/24  Educator [facility]  Instruction Review Code 1- Verbalizes Understanding    Chronic Lung Disease Review: - Group verbal instruction with posters, models, PowerPoint presentations and videos,  to review new updates, new respiratory medications, new advancements in procedures and treatments. Providing information on websites and 800 numbers for continued self-education. Includes information about supplement oxygen, available portable oxygen systems, continuous and intermittent flow rates, oxygen safety, concentrators, and Medicare reimbursement for oxygen. Explanation of Pulmonary Drugs, including class, frequency, complications, importance of spacers, rinsing mouth after steroid MDI's, and proper cleaning methods for nebulizers. Review of basic lung anatomy and physiology related to function, structure, and complications of lung disease. Review of risk factors. Discussion about methods for diagnosing sleep apnea and types of masks and machines for OSA. Includes a review of the use of types of environmental controls: home humidity, furnaces, filters, dust mite/pet prevention, HEPA vacuums. Discussion about weather changes, air quality and the benefits of nasal washing. Instruction on Warning signs, infection symptoms, calling MD promptly, preventive modes, and value of vaccinations. Review of effective airway clearance, coughing and/or vibration techniques. Emphasizing that all should Create an Action Plan. Written material provided at class time.   AED/CPR: - Group verbal and written instruction with the use of models to demonstrate the basic use of the AED with the basic ABC's of  resuscitation.    Tests and Procedures:  - Group verbal and visual presentation and models provide information about basic cardiac anatomy and function. Reviews the testing methods done to diagnose heart disease and the outcomes of the test results. Describes the treatment choices: Medical Management, Angioplasty, or Coronary Bypass Surgery for treating various heart conditions including Myocardial Infarction, Angina, Valve Disease, and Cardiac Arrhythmias.  Written material provided at class time.   Medication Safety: - Group verbal and visual instruction to review commonly prescribed medications for heart and lung disease. Reviews the medication, class of the drug, and side effects. Includes the steps to properly store meds and maintain the prescription regimen.  Written material given at graduation.   Other: -Provides group and verbal instruction on various topics (see comments)   Knowledge Questionnaire Score:    Core Components/Risk Factors/Patient Goals at Admission:  Personal Goals and Risk Factors at Admission - 05/15/24 1437       Core Components/Risk Factors/Patient Goals on Admission   Diabetes Yes    Intervention Provide education about proper nutrition, including hydration, and aerobic/resistive exercise prescription along with prescribed medications to achieve blood glucose in normal ranges: Fasting glucose 65-99 mg/dL;Provide education about signs/symptoms and action to take for hypo/hyperglycemia.    Expected Outcomes Short Term: Participant verbalizes understanding of the signs/symptoms and immediate care of hyper/hypoglycemia, proper foot care and importance of medication, aerobic/resistive exercise and nutrition plan for blood glucose control.;Long Term: Attainment of HbA1C < 7%.    Hypertension Yes    Intervention Provide education on lifestyle modifcations including regular physical activity/exercise, weight management, moderate sodium restriction and increased  consumption of fresh fruit, vegetables, and low fat dairy, alcohol moderation, and smoking cessation.;Monitor prescription use compliance.    Expected Outcomes Short Term: Continued assessment and intervention until BP is < 140/58mm HG in hypertensive  participants. < 130/76mm HG in hypertensive participants with diabetes, heart failure or chronic kidney disease.;Long Term: Maintenance of blood pressure at goal levels.    Lipids Yes    Intervention Provide education and support for participant on nutrition & aerobic/resistive exercise along with prescribed medications to achieve LDL 70mg , HDL >40mg .    Expected Outcomes Long Term: Cholesterol controlled with medications as prescribed, with individualized exercise RX and with personalized nutrition plan. Value goals: LDL < 70mg , HDL > 40 mg.;Short Term: Participant states understanding of desired cholesterol values and is compliant with medications prescribed. Participant is following exercise prescription and nutrition guidelines.          Education:Diabetes - Individual verbal and written instruction to review signs/symptoms of diabetes, desired ranges of glucose level fasting, after meals and with exercise. Acknowledge that pre and post exercise glucose checks will be done for 3 sessions at entry of program. Flowsheet Row Pulmonary Rehab from 05/20/2024 in [facility] Cardiac and Pulmonary Re[facility]  Date 05/20/24  Educator [facility]  Instruction Review Code 1- Verbalizes Understanding    Know Your Numbers and Heart Failure: - Group verbal and visual instruction to discuss disease risk factors for cardiac and pulmonary disease and treatment options.  Reviews associated critical values for Overweight/Obesity, Hypertension, Cholesterol, and Diabetes.  Discusses basics of heart failure: signs/symptoms and treatments.  Introduces Heart Failure Zone chart for action plan for heart failure. Written material provided at class time.   Core Components/Risk  Factors/Patient Goals Review:    Core Components/Risk Factors/Patient Goals at Discharge (Final Review):    ITP Comments:  ITP Comments     Row Name 05/15/24 1445 05/20/24 1531         ITP Comments Initial phone call completed. Diagnosis can be found in [facility] 10/16. EP Orientation scheduled for Wednesday 12/17 at 1:45. Completed 6 minute T4 nustep test and gym orientation for pulmonary rehab. Initial ITP created and sent for review to Dr. Fuad Aleskerov, Medical Director.         Comments: Initial ITP     [1]  Current Outpatient Medications:    ACCU-CHEK GUIDE TEST test strip, 1 each as needed., Disp: , Rfl:    acetaminophen  (TYLENOL ) 325 MG tablet, Take 2 tablets (650 mg total) by mouth every 6 (six) hours as needed for mild pain (pain score 1-3) (or Fever >/= 101)., Disp: 20 tablet, Rfl: 0   albuterol  (PROVENTIL ) (2.5 MG/3ML) 0.083% nebulizer solution, Take 3 mLs (2.5 mg total) by nebulization every 6 (six) hours as needed for wheezing or shortness of breath., Disp: 75 mL, Rfl: 12   albuterol  (VENTOLIN  HFA) 108 (90 Base) MCG/ACT inhaler, Inhale 2 puffs into the lungs every 6 (six) hours as needed for wheezing or shortness of breath., Disp: 8 g, Rfl: 2   amLODipine  (NORVASC ) 5 MG tablet, Take 5 mg by mouth daily., Disp: , Rfl:    aspirin  81 MG chewable tablet, Chew 81 mg by mouth daily. , Disp: , Rfl:    azelastine  (ASTELIN ) 0.1 % nasal spray, SMARTSIG:1-2 Spray(s) Both Nares Every 12 Hours PRN, Disp: , Rfl:    Blood Glucose Monitoring Suppl ([employer] BLOOD GLUCOSE METER) w/Device KIT, Use as directed to monitor blood glucose up to qid, Disp: 1 kit, Rfl: 0   cetirizine  (ZYRTEC ) 10 MG tablet, Take 1 tablet (10 mg total) by mouth daily as needed for allergies. Home med., Disp: , Rfl:    ciclopirox (PENLAC) 8 % solution, Apply 1 Application topically daily., Disp: , Rfl:  clonazePAM  (KLONOPIN ) 0.5 MG tablet, Take 0.5 mg by mouth in the morning, at noon, and at bedtime., Disp: , Rfl:     cloNIDine  (CATAPRES  - DOSED IN MG/24 HR) 0.1 mg/24hr patch, Place 0.1 mg onto the skin once a week., Disp: , Rfl:    docusate sodium  (COLACE) 100 MG capsule, Take 2 capsules (200 mg total) by mouth daily as needed for mild constipation., Disp: 10 capsule, Rfl: 0   famotidine  (PEPCID ) 20 MG tablet, Take 1 tablet by mouth daily as needed for heartburn, Disp: , Rfl:    fluticasone  (FLONASE ) 50 MCG/ACT nasal spray, Place 1 spray into both nostrils daily as needed for allergies or rhinitis. Home med., Disp: , Rfl:    guaiFENesin  (MUCINEX ) 600 MG 12 hr tablet, Take 600 mg by mouth 2 (two) times daily., Disp: , Rfl:    hydrOXYzine  (VISTARIL ) 25 MG capsule, Take 25 mg by mouth every 6 (six) hours as needed., Disp: , Rfl:    ipratropium-albuterol  (DUONEB) 0.5-2.5 (3) MG/3ML SOLN, Take 3 mLs by nebulization every 6 (six) hours as needed (for shortness of breath)., Disp: 1500 mL, Rfl: 10   LANTUS  SOLOSTAR 100 UNIT/ML Solostar Pen, Inject 40 Units into the skin at bedtime., Disp: , Rfl:    mirtazapine  (REMERON ) 45 MG tablet, Take 45 mg by mouth at bedtime., Disp: , Rfl:    Multiple Vitamin (MULTIVITAMIN WITH MINERALS) TABS tablet, Take 1 tablet by mouth daily., Disp: , Rfl:    naloxone  (NARCAN ) nasal spray 4 mg/0.1 mL, Place 1 spray into the nose once as needed for up to 1 dose. Home med., Disp: , Rfl:    NOVOLOG  FLEXPEN 100 UNIT/ML FlexPen, Inject 5-10 Units into the skin 3 (three) times daily with meals. Sliding scale mealtime insulin ., Disp: , Rfl:    nystatin  ointment (MYCOSTATIN ), Apply 1 Application topically 3 (three) times daily as needed. Home med., Disp: , Rfl:    oxyCODONE -acetaminophen  (PERCOCET) 7.5-325 MG tablet, Take 1 tablet by mouth 3 (three) times daily as needed., Disp: , Rfl:    PARoxetine  (PAXIL ) 30 MG tablet, Take 30 mg by mouth daily., Disp: , Rfl:    polyethylene glycol powder (GLYCOLAX /MIRALAX ) 17 GM/SCOOP powder, Take 17 g by mouth daily as needed. Home med., Disp: , Rfl:    predniSONE   (DELTASONE ) 10 MG tablet, Take 1 tablet (10 mg total) by mouth daily with breakfast. 14 days, Disp: 14 tablet, Rfl: 1   PULMICORT  0.5 MG/2ML nebulizer solution, Take 0.5 mg by nebulization every 12 (twelve) hours., Disp: , Rfl:    roflumilast  (DALIRESP ) 500 MCG TABS tablet, Take 1 tablet (500 mcg total) by mouth daily., Disp: 30 tablet, Rfl: 11   sulfamethoxazole-trimethoprim (BACTRIM DS) 800-160 MG tablet, Take 1 tablet by mouth 2 (two) times daily., Disp: , Rfl:    terbinafine (LAMISIL) 1 % cream, Apply 1 application  topically 2 (two) times daily. To affected skin, for 1 week after resolution of rash. # 30 gm, 1 refill., Disp: , Rfl:    TRUEPLUS 5-BEVEL PEN NEEDLES 31G X 8 MM MISC, , Disp: , Rfl:    TRULICITY 4.5 MG/0.5ML SOPN, Inject 4.5 mg into the skin once a week., Disp: , Rfl:    Vitamin D , Ergocalciferol , (DRISDOL ) 1.25 MG (50000 UNIT) CAPS capsule, Take 50,000 Units by mouth every 7 (seven) days., Disp: , Rfl:  [2]  Social History Tobacco Use  Smoking Status Former   Current packs/day: 0.00   Average packs/day: 0.1 packs/day for 41.0 years (4.1  ttl pk-yrs)   Types: Cigarettes   Start date: 06/09/1976   Quit date: 06/09/2017   Years since quitting: 6.9  Smokeless Tobacco Never

## 2024-05-20 NOTE — Patient Instructions (Signed)
 Patient Instructions  Patient Details  Name: Michelle [name] MRN: [id number] Date of Birth: [birth year] Referring Provider:  Kasa, Kurian, MD  Below are your personal goals for exercise, nutrition, and risk factors. Our goal is to help you stay on track towards obtaining and maintaining these goals. We will be discussing your progress on these goals with you throughout the program.  Initial Exercise Prescription:  Initial Exercise Prescription - 05/20/24 1500       Date of Initial Exercise RX and Referring Provider   Date 05/20/24    Referring Provider Dr. Kurain Kasa      Oxygen   Maintain Oxygen Saturation 88% or higher      NuStep   Level 1    SPM 80    Minutes 15    METs 2.18      T5 Nustep   Level 1    SPM 80    Minutes 15    METs 2.18      Prescription Details   Duration Progress to 30 minutes of continuous aerobic without signs/symptoms of physical distress      Intensity   THRR 40-80% of Max Heartrate 125-147    Ratings of Perceived Exertion 11-13    Perceived Dyspnea 0-4      Progression   Progression Continue to progress workloads to maintain intensity without signs/symptoms of physical distress.      Resistance Training   Training Prescription Yes    Weight 2lb    Reps 10-15          Exercise Goals: Frequency: Be able to perform aerobic exercise two to three times per week in program working toward 2-5 days per week of home exercise.  Intensity: Work with a perceived exertion of 11 (fairly light) - 15 (hard) while following your exercise prescription.  We will make changes to your prescription with you as you progress through the program.   Duration: Be able to do 30 to 45 minutes of continuous aerobic exercise in addition to a 5 minute warm-up and a 5 minute cool-down routine.   Nutrition Goals: Your personal nutrition goals will be established when you do your nutrition analysis with the dietician.  The following are general nutrition guidelines  to follow: Cholesterol < 200mg /day Sodium < 1500mg /day Fiber: Women over 50 yrs - 21 grams per day  Personal Goals:  Personal Goals and Risk Factors at Admission - 05/15/24 1437       Core Components/Risk Factors/Patient Goals on Admission   Diabetes Yes    Intervention Provide education about proper nutrition, including hydration, and aerobic/resistive exercise prescription along with prescribed medications to achieve blood glucose in normal ranges: Fasting glucose 65-99 mg/dL;Provide education about signs/symptoms and action to take for hypo/hyperglycemia.    Expected Outcomes Short Term: Participant verbalizes understanding of the signs/symptoms and immediate care of hyper/hypoglycemia, proper foot care and importance of medication, aerobic/resistive exercise and nutrition plan for blood glucose control.;Long Term: Attainment of HbA1C < 7%.    Hypertension Yes    Intervention Provide education on lifestyle modifcations including regular physical activity/exercise, weight management, moderate sodium restriction and increased consumption of fresh fruit, vegetables, and low fat dairy, alcohol moderation, and smoking cessation.;Monitor prescription use compliance.    Expected Outcomes Short Term: Continued assessment and intervention until BP is < 140/50mm HG in hypertensive participants. < 130/31mm HG in hypertensive participants with diabetes, heart failure or chronic kidney disease.;Long Term: Maintenance of blood pressure at goal levels.    Lipids Yes  Intervention Provide education and support for participant on nutrition & aerobic/resistive exercise along with prescribed medications to achieve LDL 70mg , HDL >40mg .    Expected Outcomes Long Term: Cholesterol controlled with medications as prescribed, with individualized exercise RX and with personalized nutrition plan. Value goals: LDL < 70mg , HDL > 40 mg.;Short Term: Participant states understanding of desired cholesterol values and is  compliant with medications prescribed. Participant is following exercise prescription and nutrition guidelines.          Tobacco Use Initial Evaluation: Social History   Tobacco Use  Smoking Status Former   Current packs/day: 0.00   Average packs/day: 0.1 packs/day for 41.0 years (4.1 ttl pk-yrs)   Types: Cigarettes   Start date: 06/09/1976   Quit date: 06/09/2017   Years since quitting: 6.9  Smokeless Tobacco Never    Exercise Goals and Review:  Exercise Goals     Row Name 05/20/24 1541             Exercise Goals   Increase Physical Activity Yes       Intervention Develop an individualized exercise prescription for aerobic and resistive training based on initial evaluation findings, risk stratification, comorbidities and participant's personal goals.;Provide advice, education, support and counseling about physical activity/exercise needs.       Expected Outcomes Short Term: Attend rehab on a regular basis to increase amount of physical activity.;Long Term: Add in home exercise to make exercise part of routine and to increase amount of physical activity.;Long Term: Exercising regularly at least 3-5 days a week.       Increase Strength and Stamina Yes       Intervention Provide advice, education, support and counseling about physical activity/exercise needs.;Develop an individualized exercise prescription for aerobic and resistive training based on initial evaluation findings, risk stratification, comorbidities and participant's personal goals.       Expected Outcomes Short Term: Increase workloads from initial exercise prescription for resistance, speed, and METs.;Long Term: Improve cardiorespiratory fitness, muscular endurance and strength as measured by increased METs and functional capacity ( );Short Term: Perform resistance training exercises routinely during rehab and add in resistance training at home       Able to understand and use rate of perceived exertion (RPE) scale Yes        Intervention Provide education and explanation on how to use RPE scale       Expected Outcomes Short Term: Able to use RPE daily in rehab to express subjective intensity level;Long Term:  Able to use RPE to guide intensity level when exercising independently       Able to understand and use Dyspnea scale Yes       Intervention Provide education and explanation on how to use Dyspnea scale       Expected Outcomes Short Term: Able to use Dyspnea scale daily in rehab to express subjective sense of shortness of breath during exertion;Long Term: Able to use Dyspnea scale to guide intensity level when exercising independently       Knowledge and understanding of Target Heart Rate Range (THRR) Yes       Intervention Provide education and explanation of THRR including how the numbers were predicted and where they are located for reference       Expected Outcomes Short Term: Able to use daily as guideline for intensity in rehab;Long Term: Able to use THRR to govern intensity when exercising independently;Short Term: Able to state/look up THRR       Able to check pulse independently  Yes       Intervention Provide education and demonstration on how to check pulse in carotid and radial arteries.;Review the importance of being able to check your own pulse for safety during independent exercise       Expected Outcomes Short Term: Able to explain why pulse checking is important during independent exercise;Long Term: Able to check pulse independently and accurately       Understanding of Exercise Prescription Yes       Intervention Provide education, explanation, and written materials on patient's individual exercise prescription       Expected Outcomes Long Term: Able to explain home exercise prescription to exercise independently;Short Term: Able to explain program exercise prescription          Copy of goals given to participant.

## 2024-06-11 ENCOUNTER — Encounter: Attending: Internal Medicine | Admitting: *Deleted

## 2024-06-11 DIAGNOSIS — J449 Chronic obstructive pulmonary disease, unspecified: Secondary | ICD-10-CM | POA: Insufficient documentation

## 2024-06-11 LAB — GLUCOSE, CAPILLARY
Glucose-Capillary: 152 mg/dL — ABNORMAL HIGH (ref 70–99)
Glucose-Capillary: 164 mg/dL — ABNORMAL HIGH (ref 70–99)

## 2024-06-11 NOTE — Progress Notes (Signed)
 Daily Session Note  Patient Details  Name: Michelle [name] MRN: [id number] Date of Birth: [birth year] Referring Provider:   Flowsheet Row Pulmonary Rehab from 05/20/2024 in [facility] Cardiac and Pulmonary Rehab  Referring Provider Dr. Hubert Cellar    Encounter Date: 06/11/2024  Check In:  Session Check In - 06/11/24 1454       Check-In   Supervising physician immediately available to respond to emergencies See telemetry face sheet for immediately available ER MD    Location [facility]-Cardiac & Pulmonary Rehab    Staff Present Othel Durand, RN, BSN, CCRP;Meredith Tressa RN,BSN;Joseph Hood RCP,RRT,BSRT;Noah Tickle, [place], Exercise Physiologist    Virtual Visit No    Medication changes reported     No    Fall or balance concerns reported    No    Warm-up and Cool-down Performed on first and last piece of equipment    Resistance Training Performed Yes    VAD Patient? No    PAD/SET Patient? No      Pain Assessment   Currently in Pain? No/denies             Tobacco Use History[1]  Goals Met:  Proper associated with RPD/PD & O2 Sat Exercise tolerated well Personal goals reviewed No report of concerns or symptoms today  Goals Unmet:  Not Applicable  Comments: Pt able to follow exercise prescription today without complaint.  Will continue to monitor for progression. First full day of exercise!  Patient was oriented to gym and equipment including functions, settings, policies, and procedures.  Patient's individual exercise prescription and treatment plan were reviewed.  All starting workloads were established based on the results of the 6 minute walk test done at initial orientation visit.  The plan for exercise progression was also introduced and progression will be customized based on patient's performance and goals.    Dr. Oneil Pinal is Medical Director for [facility] Cardiac Rehabilitation.  Dr. Fuad Aleskerov is Medical Director for [facility] Pulmonary Rehabilitation.    [1]  Social  History Tobacco Use  Smoking Status Former   Current packs/day: 0.00   Average packs/day: 0.1 packs/day for 41.0 years (4.1 ttl pk-yrs)   Types: Cigarettes   Start date: 06/09/1976   Quit date: 06/09/2017   Years since quitting: 7.0  Smokeless Tobacco Never

## 2024-06-15 ENCOUNTER — Ambulatory Visit: Payer: Self-pay | Admitting: Internal Medicine

## 2024-06-15 ENCOUNTER — Other Ambulatory Visit: Payer: Self-pay | Admitting: Internal Medicine

## 2024-06-15 ENCOUNTER — Ambulatory Visit: Payer: Self-pay

## 2024-06-15 DIAGNOSIS — J441 Chronic obstructive pulmonary disease with (acute) exacerbation: Secondary | ICD-10-CM

## 2024-06-15 MED ORDER — PREDNISONE 20 MG PO TABS: 20.0000 mg | ORAL_TABLET | Freq: Every day | ORAL | 1 refills | Status: AC

## 2024-06-15 MED ORDER — AMOXICILLIN-POT CLAVULANATE 875-125 MG PO TABS: 1.0000 | ORAL_TABLET | Freq: Two times a day (BID) | ORAL | 0 refills | Status: DC

## 2024-06-15 MED ORDER — AMOXICILLIN-POT CLAVULANATE 875-125 MG PO TABS: 1.0000 | ORAL_TABLET | Freq: Two times a day (BID) | ORAL | 0 refills | Status: AC

## 2024-06-15 MED ORDER — PREDNISONE 20 MG PO TABS: 20.0000 mg | ORAL_TABLET | Freq: Every day | ORAL | 1 refills | Status: DC

## 2024-06-15 NOTE — Progress Notes (Signed)
 Patient with COPD exacerbation ABX and PRED ordered

## 2024-06-15 NOTE — Telephone Encounter (Signed)
 Copied from CRM #[id number]. Topic: Clinical - Red Word Triage >> Jun 15, 2024  9:22 AM Russell PARAS wrote: Red Word that prompted transfer to Nurse Triage:   Pt's nurse [name] contacting clinic Pt is on trach and ventilator Recently began having more secretions that are thicker and yellowish green in color Secretions were bloody yesterday Has odor that indicates infection Currently on prednisone   Pt of Kasa

## 2024-06-15 NOTE — Telephone Encounter (Signed)
 See second triage message from today.  Closing this encounter.

## 2024-06-15 NOTE — Telephone Encounter (Signed)
 Dana-- [phone]  Daughter and Home Health Care Nurse [name] want to avoid the patient having to go to the ER at this time if possible. They state if patient gets any worse they will call 911 to take her to the Emergency Room.    FYI Only or Action Required?: Action required by provider: clinical question for provider, update on patient condition, and request for antibiotics--.  Patient is followed in Pulmonology for Trach and Vent, last seen on 05/13/2024 by Isaiah Scrivener, MD.  Called Nurse Triage reporting Cough.  Symptoms began a week ago.  Interventions attempted: OTC medications: robitussin, Prescription medications: prednisone , Nebulizer treatments, Home oxygen use, and Other: trach.  Symptoms are: better than earlier but overall getting worse.  Triage Disposition: Go to ED Now (or PCP Triage)  Patient/caregiver understands and will follow disposition?: No, wishes to speak with PCP  Copied from CRM #[id number]. Topic: Clinical - Red Word Triage >> Jun 15, 2024 11:24 AM LaVerne A wrote: Red Word that prompted transfer to Nurse Triage: Last week had a cold, now shortness of breath, low grade temperature, cough with mucus production is dark green and yellowish. Reason for Disposition  Patient sounds very sick or weak to the triager    Advised home health that with elevated pulse, temperature earlier (hard to get at this time) with her symptoms--this RN recommended being evaluated --ER due to history and symptoms.  They wanted pulmonologist to review and prescribe antibiotics if they would do that and if patient gets worse they will call 911.  Answer Assessment - Initial Assessment Questions -  Answer Assessment - Initial Assessment Questions Daughter calling for patient and she was with the patient about 30 minutes prior to this Triage. Daughter states patient's temperature was 99.5 F with a forehead thermometer Daughter advised that the patient started having symptoms last  week. Patient took Robitussin and Prednisone  Daughter states patient having a lot of mucous Jamal was changed Jan 2nd Daughter noticed last night the mucous is dark green/yellow in her inner cannula  Daughter states that when she left the patient earlier she seemed like she had labored breathing.  Daughter states that she is about ten minutes from the patient at this time. It was recommended by another Triage Nurse earlier who spoke with home health that it is recommended that the patient be seen in the next four hours. This RN advised patient's daughter that if she is getting short of breath it is recommended that she be seen in the next four hours as well but if anything has changed or worsened---daughter is advised to call 911. She states she will see how the patient is going when she arrives at her in the next ten minutes and call us  back.  She is also advised that if anything is worse she can call 911.  Daughter states she will see how the patient is doing when she gets there and call us  back or she will call 911 if needed. This RN attempted to call [name] back at 11:45am to check on status of patient---no answer--left a voicemail  Daughter states that the patient is resting better now and seems to have calmed down.  115 pulse Around 140/78 blood pressure Temperature reading around 97-98 but patient still feels like when it was low grade 99 earlier  [name]-- [phone]  They are advised with patient's history, elevated pulse, symptoms, and potential fever (forehead thermometer not consistent) ER is advised but they wanted to avoid that if possible  Daughter and Home Health Care Nurse [name] want to avoid the patient having to go to the ER at this time if possible. They state if patient gets any worse they will call 911 to take her to the Emergency Room.   Patient is advised to call us  back if anything changes or with any further questions/concerns. Patient is advised that if anything  worsens to go to the Emergency Room. Patient verbalized understanding.  Protocols used: Cough - Acute Productive-A-AH, Breathing Difficulty-A-AH

## 2024-06-15 NOTE — Telephone Encounter (Signed)
 FYI Only or Action Required?: Action required by provider: Request for antibiotics.  Patient is followed in Pulmonology for Trach and vent, last seen on 05/13/2024 by Isaiah Scrivener, MD.  Called Nurse Triage reporting Shortness of Breath.  Symptoms began a week ago.  Interventions attempted: Other:  .  Symptoms are: gradually worsening.  Triage Disposition: See HCP Within 4 Hours (Or PCP Triage)  Patient/caregiver understands and will follow disposition?: no does not feel well enough to come to office.                        [facility] Pulmonary Triage - Initial Assessment Questions Chief Complaint (e.g., cough, sob, wheezing, fever, chills, sweat or additional symptoms) *Go to specific symptom protocol after initial questions. SOB, LGF 99.5, Green yellow smelly phlegm - increased  secretions - suctioning very frequency  How long have symptoms been present? 1 week  Have you tested for COVID or Flu? Note: If not, ask patient if a home test can be taken. If so, instruct patient to call back for positive results. No  MEDICINES:   Have you used any OTC meds to help with symptoms? No If yes, ask What medications? na  Have you used your inhalers/maintenance medication? Yes If yes, What medications?   If inhaler, ask How many puffs and how often? Note: Review instructions on medication in the chart.    Do you monitor your oxygen levels? Yes If yes, What is your reading (oxygen level) today? 98  What is your usual oxygen saturation reading?  (Note: Pulmonary O2 sats should be 90% or greater) 98 - pulse 141                   Copied from CRM #[id number]. Topic: Clinical - Red Word Triage >> Jun 15, 2024  9:32 AM Russell PARAS wrote: Red Word that prompted transfer to Nurse Triage:    Pt's nurse [name] contacting clinic  Pt is on trach and ventilator  Recently began having more secretions that are thicker and yellowish green in  color  Secretions were bloody yesterday  Has odor that indicates infection  Currently on prednisone    Pt of [place] Reason for Disposition  [1] MILD difficulty breathing (e.g., minimal/no SOB at rest, SOB with walking, pulse < 100) AND [2] NEW-onset or WORSE than normal  Answer Assessment - Initial Assessment Questions 1. RESPIRATORY STATUS: Describe your breathing? (e.g., wheezing, shortness of breath, unable to speak, severe coughing)      SOB, increased secretions, LGF 2. ONSET: When did this breathing problem begin?      1 week 3. PATTERN Does the difficult breathing come and go, or has it been constant since it started?      constant 4. SEVERITY: How bad is your breathing? (e.g., mild, moderate, severe)      moderate 5. RECURRENT SYMPTOM: Have you had difficulty breathing before? If Yes, ask: When was the last time? and What happened that time?      yes  7. LUNG HISTORY: Do you have any history of lung disease?  (e.g., pulmonary embolus, asthma, emphysema)     Trach and vent 8. CAUSE: What do you think is causing the breathing problem?      infection 9. OTHER SYMPTOMS: Do you have any other symptoms? (e.g., chest pain, cough, dizziness, fever, runny nose)     Increased secretions, sob, unable to come into office. Secretions Green/yellow/smelly. Secretions were bloody yesterday.Trach and vent. Wants abx  prescribed. LGF 10. O2 SATURATION MONITOR:  Do you use an oxygen saturation monitor (pulse oximeter) at home? If Yes, ask: What is your reading (oxygen level) today? What is your usual oxygen saturation reading? (e.g., 95%)       98  Protocols used: Breathing Difficulty-A-AH

## 2024-06-15 NOTE — Telephone Encounter (Signed)
 Per secure chat with Dr. Isaiah- can do steroids and ABX as outpatient OR got to ER...those are her options.  I notified the patient. She would like the antibiotic sent to [facility]. She is currently on Prednisone  10mg  daily. She has been taking it for 2 weeks. I told her I would ask if Dr. Isaiah would like to extend that prescriptions.   Per secure chat with Dr. Isaiah- I can extend prednisone . If not any better then she needs to go back to the ED.  I have notified the patient's daughter.  Nothing further needed.

## 2024-06-15 NOTE — Addendum Note (Signed)
 Addended by: VICCI EVALENE DEL on: 06/15/2024 01:08 PM   Modules accepted: Orders

## 2024-06-17 ENCOUNTER — Encounter: Payer: Self-pay | Admitting: *Deleted

## 2024-06-17 DIAGNOSIS — J449 Chronic obstructive pulmonary disease, unspecified: Secondary | ICD-10-CM

## 2024-06-17 NOTE — Progress Notes (Signed)
 Pulmonary Individual Treatment Plan  Patient Details  Name: Michelle [name] MRN: [id number] Date of Birth: 1960-11-25 Referring Provider:   Flowsheet Row Pulmonary Rehab from 05/20/2024 in [facility] Cardiac and Pulmonary Rehab  Referring Provider Dr. Hubert Cellar    Initial Encounter Date:  Flowsheet Row Pulmonary Rehab from 05/20/2024 in [facility] Cardiac and Pulmonary Rehab  Date 05/20/24    Visit Diagnosis: Chronic obstructive pulmonary disease, unspecified COPD type (HCC)  Patient's Home Medications on Admission: Current Medications[1]  Past Medical History: Past Medical History:  Diagnosis Date   Allergy    Anxiety    Asthma    CHF (congestive heart failure) (HCC)    Cocaine abuse (HCC)    COPD (chronic obstructive pulmonary disease) (HCC)    Coronary artery disease    Emphysema/COPD    Hepatitis C    High cholesterol    Hypertension    Pneumothorax    Tobacco abuse     Tobacco Use: Tobacco Use History[2]  Labs: Review Flowsheet  More data exists      Latest Ref Rng & Units 08/29/2023 12/15/2023 03/08/2024 03/10/2024 03/12/2024  Labs for ITP Cardiac and Pulmonary Rehab  Cholestrol 0 - 200 mg/dL - - 808  - -  LDL (calc) 0 - 99 mg/dL - - 883  - -  HDL-C >59 mg/dL - - 45  - -  Trlycerides <150 mg/dL - 881  850  - -  Hemoglobin A1c 4.8 - 5.6 % 9.4  - - 8.7  -  PH, Arterial 7.35 - 7.45 - - 7.42  - 7.38   PCO2 arterial 32 - 48 mmHg - - 53  - 50   Bicarbonate 20.0 - 28.0 mmol/L 24.8  25.7  - 34.4  - 29.6   Acid-base deficit 0.0 - 2.0 mmol/L 1.5  1.2  - - - -  O2 Saturation % 97.5  97.6  - 94.6  - 98     Details       Multiple values from one day are sorted in reverse-chronological order          Pulmonary Assessment Scores:   UCSD: Self-administered rating of dyspnea associated with activities of daily living (ADLs) 6-point scale (0 = not at all to 5 = maximal or unable to do because of breathlessness)  Scoring Scores range from 0 to 120.  Minimally important  difference is 5 units  CAT: CAT can identify the health impairment of COPD patients and is better correlated with disease progression.  CAT has a scoring range of zero to 40. The CAT score is classified into four groups of low (less than 10), medium (10 - 20), high (21-30) and very high (31-40) based on the impact level of disease on health status. A CAT score over 10 suggests significant symptoms.  A worsening CAT score could be explained by an exacerbation, poor medication adherence, poor inhaler technique, or progression of COPD or comorbid conditions.  CAT MCID is 2 points  mMRC: mMRC (Modified Medical Research Council) Dyspnea Scale is used to assess the degree of baseline functional disability in patients of respiratory disease due to dyspnea. No minimal important difference is established. A decrease in score of 1 point or greater is considered a positive change.   Pulmonary Function Assessment:   Exercise Target Goals: Exercise Program Goal: Individual exercise prescription set using results from initial 6 min walk test and THRR while considering  patients activity barriers and safety.   Exercise Prescription Goal: Initial exercise  prescription builds to 30-45 minutes a day of aerobic activity, 2-3 days per week.  Home exercise guidelines will be given to patient during program as part of exercise prescription that the participant will acknowledge.  Education: Aerobic Exercise: - Group verbal and visual presentation on the components of exercise prescription. Introduces F.I.T.T principle from ACSM for exercise prescriptions.  Reviews F.I.T.T. principles of aerobic exercise including progression. Written material provided at class time.   Education: Resistance Exercise: - Group verbal and visual presentation on the components of exercise prescription. Introduces F.I.T.T principle from ACSM for exercise prescriptions  Reviews F.I.T.T. principles of resistance exercise including  progression. Written material provided at class time.    Education: Exercise & Equipment Safety: - Individual verbal instruction and demonstration of equipment use and safety with use of the equipment. Flowsheet Row Pulmonary Rehab from 05/20/2024 in [facility] Cardiac and Pulmonary Rehab  Date 05/20/24  Educator [facility]  Instruction Review Code 1- Verbalizes Understanding    Education: Exercise Physiology & General Exercise Guidelines: - Group verbal and written instruction with models to review the exercise physiology of the cardiovascular system and associated critical values. Provides general exercise guidelines with specific guidelines to those with heart or lung disease.    Education: Flexibility, Balance, Mind/Body Relaxation: - Group verbal and visual presentation with interactive activity on the components of exercise prescription. Introduces F.I.T.T principle from ACSM for exercise prescriptions. Reviews F.I.T.T. principles of flexibility and balance exercise training including progression. Also discusses the mind body connection.  Reviews various relaxation techniques to help reduce and manage stress (i.e. Deep breathing, progressive muscle relaxation, and visualization). Balance handout provided to take home. Written material provided at class time.   Activity Barriers & Risk Stratification:  Activity Barriers & Cardiac Risk Stratification - 05/20/24 1536       Activity Barriers & Cardiac Risk Stratification   Activity Barriers Balance Concerns;Muscular Weakness;Deconditioning;Shortness of Breath;History of Falls;Other (comment);Assistive Device    Comments Trach and Vent dependent, wheelchair dependent          6 Minute Walk:  6 Minute Walk     Row Name 05/20/24 1533         6 Minute Walk   Phase Initial     Distance 369 feet     Walk Time 6 minutes     # of Rest Breaks 0     MPH 0.7     METS 2.18     RPE 11     Perceived Dyspnea  1     VO2 Peak 7.6     Symptoms No      Resting HR 104 bpm     Resting BP 108/74     Resting Oxygen Saturation  97 %     Exercise Oxygen Saturation  during 6 min walk 97 %     Max Ex. HR 104 bpm     Max Ex. BP 144/76     2 Minute Post BP 132/76       Interval HR   1 Minute HR 107     2 Minute HR 109     3 Minute HR 110     4 Minute HR 112     5 Minute HR 118     6 Minute HR 120     2 Minute Post HR 108     Interval Heart Rate? Yes       Interval Oxygen   Interval Oxygen? Yes     Baseline Oxygen Saturation %  97 %     1 Minute Oxygen Saturation % 97 %     1 Minute Liters of Oxygen 6 L     2 Minute Oxygen Saturation % 97 %     2 Minute Liters of Oxygen 6 L     3 Minute Oxygen Saturation % 97 %     3 Minute Liters of Oxygen 6 L     4 Minute Oxygen Saturation % 98 %     4 Minute Liters of Oxygen 6 L     5 Minute Oxygen Saturation % 97 %     5 Minute Liters of Oxygen 6 L     6 Minute Oxygen Saturation % 97 %     6 Minute Liters of Oxygen 6 L     2 Minute Post Oxygen Saturation % 97 %     2 Minute Post Liters of Oxygen 6 L       Oxygen Initial Assessment:  Oxygen Initial Assessment - 05/20/24 1608       Home Oxygen   Home Oxygen Device Portable Concentrator   Ventilatior   Sleep Oxygen Prescription Continuous    Liters per minute 5    Home Exercise Oxygen Prescription Continuous    Liters per minute 5    Home Resting Oxygen Prescription Continuous    Liters per minute 5    Compliance with Home Oxygen Use Yes      Initial 6 min Walk   Oxygen Used Continuous    Liters per minute 6      Program Oxygen Prescription   Program Oxygen Prescription Continuous    Liters per minute 6    Comments Ventilator      Intervention   Short Term Goals To learn and exhibit compliance with exercise, home and travel O2 prescription;To learn and understand importance of monitoring SPO2 with pulse oximeter and demonstrate accurate use of the pulse oximeter.;To learn and understand importance of maintaining oxygen  saturations>88%;To learn and demonstrate proper pursed lip breathing techniques or other breathing techniques. ;To learn and demonstrate proper use of respiratory medications    Long  Term Goals Exhibits compliance with exercise, home  and travel O2 prescription;Verbalizes importance of monitoring SPO2 with pulse oximeter and return demonstration;Maintenance of O2 saturations>88%;Exhibits proper breathing techniques, such as pursed lip breathing or other method taught during program session;Compliance with respiratory medication;Demonstrates proper use of MDIs          Oxygen Re-Evaluation:  Oxygen Re-Evaluation     Row Name 06/11/24 1457             Program Oxygen Prescription   Program Oxygen Prescription Continuous       Liters per minute 6       Comments Ventilator         Home Oxygen   Home Oxygen Device Portable Concentrator       Sleep Oxygen Prescription Continuous       Liters per minute 5       Home Exercise Oxygen Prescription Continuous       Liters per minute 5       Home Resting Oxygen Prescription Continuous       Liters per minute 5       Compliance with Home Oxygen Use Yes         Goals/Expected Outcomes   Short Term Goals To learn and exhibit compliance with exercise, home and travel O2 prescription;To learn and understand importance of monitoring SPO2  with pulse oximeter and demonstrate accurate use of the pulse oximeter.;To learn and understand importance of maintaining oxygen saturations>88%       Long  Term Goals Exhibits compliance with exercise, home  and travel O2 prescription;Verbalizes importance of monitoring SPO2 with pulse oximeter and return demonstration;Maintenance of O2 saturations>88%       Comments Michelle [name] remains on vent during exercise, goals are set to be sure she maintains oxygen sat 88% or higher.       Goals/Expected Outcomes Michelle [name] maintains oxygen sat  at 88% or higher, she is able to [employer] of maintaining her equipment.           Oxygen Discharge (Final Oxygen Re-Evaluation):  Oxygen Re-Evaluation - 06/11/24 1457       Program Oxygen Prescription   Program Oxygen Prescription Continuous    Liters per minute 6    Comments Ventilator      Home Oxygen   Home Oxygen Device Portable Concentrator    Sleep Oxygen Prescription Continuous    Liters per minute 5    Home Exercise Oxygen Prescription Continuous    Liters per minute 5    Home Resting Oxygen Prescription Continuous    Liters per minute 5    Compliance with Home Oxygen Use Yes      Goals/Expected Outcomes   Short Term Goals To learn and exhibit compliance with exercise, home and travel O2 prescription;To learn and understand importance of monitoring SPO2 with pulse oximeter and demonstrate accurate use of the pulse oximeter.;To learn and understand importance of maintaining oxygen saturations>88%    Long  Term Goals Exhibits compliance with exercise, home  and travel O2 prescription;Verbalizes importance of monitoring SPO2 with pulse oximeter and return demonstration;Maintenance of O2 saturations>88%    Comments Michelle [name] remains on vent during exercise, goals are set to be sure she maintains oxygen sat 88% or higher.    Goals/Expected Outcomes Michelle [name] maintains oxygen sat  at 88% or higher, she is able to [employer] of maintaining her equipment.          Initial Exercise Prescription:  Initial Exercise Prescription - 05/20/24 1500       Date of Initial Exercise RX and Referring Provider   Date 05/20/24    Referring Provider Dr. Kurain Kasa      Oxygen   Maintain Oxygen Saturation 88% or higher      NuStep   Level 1    SPM 80    Minutes 15    METs 2.18      T5 Nustep   Level 1    SPM 80    Minutes 15    METs 2.18      Prescription Details   Duration Progress to 30 minutes of continuous aerobic without signs/symptoms of physical distress      Intensity   THRR 40-80% of Max Heartrate 125-147    Ratings of Perceived  Exertion 11-13    Perceived Dyspnea 0-4      Progression   Progression Continue to progress workloads to maintain intensity without signs/symptoms of physical distress.      Resistance Training   Training Prescription Yes    Weight 2lb    Reps 10-15          Perform Capillary Blood Glucose checks as needed.  Exercise Prescription Changes:   Exercise Prescription Changes     Row Name 05/20/24 1500             Response to Exercise  Blood Pressure (Admit) 108/74       Blood Pressure (Exercise) 144/76       Blood Pressure (Exit) 132/76       Heart Rate (Admit) 104 bpm       Heart Rate (Exercise) 121 bpm       Heart Rate (Exit) 104 bpm       Oxygen Saturation (Admit) 97 %       Oxygen Saturation (Exercise) 97 %       Oxygen Saturation (Exit) 91 %       Rating of Perceived Exertion (Exercise) 11       Perceived Dyspnea (Exercise) 1       Symptoms none       Comments 6 minutes T4 nustep test results          Exercise Comments:   Exercise Comments     Row Name 06/11/24 1455           Exercise Comments First full day of exercise!  Patient was oriented to gym and equipment including functions, settings, policies, and procedures.  Patient's individual exercise prescription and treatment plan were reviewed.  All starting workloads were established based on the results of the 6 minute walk test done at initial orientation visit.  The plan for exercise progression was also introduced and progression will be customized based on patient's performance and goals.          Exercise Goals and Review:   Exercise Goals     Row Name 05/20/24 1541             Exercise Goals   Increase Physical Activity Yes       Intervention Develop an individualized exercise prescription for aerobic and resistive training based on initial evaluation findings, risk stratification, comorbidities and participant's personal goals.;Provide advice, education, support and counseling about  physical activity/exercise needs.       Expected Outcomes Short Term: Attend rehab on a regular basis to increase amount of physical activity.;Long Term: Add in home exercise to make exercise part of routine and to increase amount of physical activity.;Long Term: Exercising regularly at least 3-5 days a week.       Increase Strength and Stamina Yes       Intervention Provide advice, education, support and counseling about physical activity/exercise needs.;Develop an individualized exercise prescription for aerobic and resistive training based on initial evaluation findings, risk stratification, comorbidities and participant's personal goals.       Expected Outcomes Short Term: Increase workloads from initial exercise prescription for resistance, speed, and METs.;Long Term: Improve cardiorespiratory fitness, muscular endurance and strength as measured by increased METs and functional capacity ( );Short Term: Perform resistance training exercises routinely during rehab and add in resistance training at home       Able to understand and use rate of perceived exertion (RPE) scale Yes       Intervention Provide education and explanation on how to use RPE scale       Expected Outcomes Short Term: Able to use RPE daily in rehab to express subjective intensity level;Long Term:  Able to use RPE to guide intensity level when exercising independently       Able to understand and use Dyspnea scale Yes       Intervention Provide education and explanation on how to use Dyspnea scale       Expected Outcomes Short Term: Able to use Dyspnea scale daily in rehab to express subjective sense of shortness of breath  during exertion;Long Term: Able to use Dyspnea scale to guide intensity level when exercising independently       Knowledge and understanding of Target Heart Rate Range (THRR) Yes       Intervention Provide education and explanation of THRR including how the numbers were predicted and where they are located for  reference       Expected Outcomes Short Term: Able to use daily as guideline for intensity in rehab;Long Term: Able to use THRR to govern intensity when exercising independently;Short Term: Able to state/look up THRR       Able to check pulse independently Yes       Intervention Provide education and demonstration on how to check pulse in carotid and radial arteries.;Review the importance of being able to check your own pulse for safety during independent exercise       Expected Outcomes Short Term: Able to explain why pulse checking is important during independent exercise;Long Term: Able to check pulse independently and accurately       Understanding of Exercise Prescription Yes       Intervention Provide education, explanation, and written materials on patient's individual exercise prescription       Expected Outcomes Long Term: Able to explain home exercise prescription to exercise independently;Short Term: Able to explain program exercise prescription          Exercise Goals Re-Evaluation :  Exercise Goals Re-Evaluation     Row Name 06/11/24 1455             Exercise Goal Re-Evaluation   Exercise Goals Review Able to understand and use rate of perceived exertion (RPE) scale;Knowledge and understanding of Target Heart Rate Range (THRR);Understanding of Exercise Prescription;Able to understand and use Dyspnea scale       Comments Reviewed RPE and dyspnea scale, THR and program prescription with pt today.  Pt voiced understanding and was given a copy of goals to take home.       Expected Outcomes Short: Use RPE daily to regulate intensity. Long: Follow program prescription in THR.          Discharge Exercise Prescription (Final Exercise Prescription Changes):  Exercise Prescription Changes - 05/20/24 1500       Response to Exercise   Blood Pressure (Admit) 108/74    Blood Pressure (Exercise) 144/76    Blood Pressure (Exit) 132/76    Heart Rate (Admit) 104 bpm    Heart Rate  (Exercise) 121 bpm    Heart Rate (Exit) 104 bpm    Oxygen Saturation (Admit) 97 %    Oxygen Saturation (Exercise) 97 %    Oxygen Saturation (Exit) 91 %    Rating of Perceived Exertion (Exercise) 11    Perceived Dyspnea (Exercise) 1    Symptoms none    Comments 6 minutes T4 nustep test results          Nutrition:  Target Goals: Understanding of nutrition guidelines, daily intake of sodium 1500mg , cholesterol 200mg , calories 30% from fat and 7% or less from saturated fats, daily to have 5 or more servings of fruits and vegetables.  Education: Nutrition 1 -Group instruction provided by verbal, written material, interactive activities, discussions, models, and posters to present general guidelines for heart healthy nutrition including macronutrients, label reading, and promoting whole foods over processed counterparts. Education serves as pensions consultant of discussion of heart healthy eating for all. Written material provided at class time.     Education: Nutrition 2 -Group instruction provided by verbal, written material,  interactive activities, discussions, models, and posters to present general guidelines for heart healthy nutrition including sodium, cholesterol, and saturated fat. Providing guidance of habit forming to improve blood pressure, cholesterol, and body weight. Written material provided at class time.     Biometrics:  Pre Biometrics - 05/20/24 1542       Pre Biometrics   Height 5' 1 (1.549 m)    Weight 116 lb 4.8 oz (52.8 kg)    BMI (Calculated) 21.99    Single Leg Stand 0 seconds   DNA          Nutrition Therapy Plan and Nutrition Goals:   Nutrition Assessments:  MEDIFICTS Score Key: >=70 Need to make dietary changes  40-70 Heart Healthy Diet <= 40 Therapeutic Level Cholesterol Diet   Picture Your Plate Scores: <59 Unhealthy dietary pattern with much room for improvement. 41-50 Dietary pattern unlikely to meet recommendations for good health and room  for improvement. 51-60 More healthful dietary pattern, with some room for improvement.  >60 Healthy dietary pattern, although there may be some specific behaviors that could be improved.   Nutrition Goals Re-Evaluation:   Nutrition Goals Discharge (Final Nutrition Goals Re-Evaluation):   Psychosocial: Target Goals: Acknowledge presence or absence of significant depression and/or stress, maximize coping skills, provide positive support system. Participant is able to verbalize types and ability to use techniques and skills needed for reducing stress and depression.   Education: Stress, Anxiety, and Depression - Group verbal and visual presentation to define topics covered.  Reviews how body is impacted by stress, anxiety, and depression.  Also discusses healthy ways to reduce stress and to treat/manage anxiety and depression.  Written material provided at class time.   Education: Sleep Hygiene -Provides group verbal and written instruction about how sleep can affect your health.  Define sleep hygiene, discuss sleep cycles and impact of sleep habits. Review good sleep hygiene tips.    Initial Review & Psychosocial Screening:  Initial Psych Review & Screening - 05/15/24 1441       Initial Review   Current issues with Current Stress Concerns;Current Anxiety/Panic      Family Dynamics   Good Support System? Yes      Barriers   Psychosocial barriers to participate in program There are no identifiable barriers or psychosocial needs.;The patient should benefit from training in stress management and relaxation.      Screening Interventions   Interventions Encouraged to exercise;Provide feedback about the scores to participant;To provide support and resources with identified psychosocial needs    Expected Outcomes Short Term goal: Utilizing psychosocial counselor, staff and physician to assist with identification of specific Stressors or current issues interfering with healing process.  Setting desired goal for each stressor or current issue identified.;Long Term Goal: Stressors or current issues are controlled or eliminated.;Short Term goal: Identification and review with participant of any Quality of Life or Depression concerns found by scoring the questionnaire.;Long Term goal: The participant improves quality of Life and PHQ9 Scores as seen by post scores and/or verbalization of changes          Quality of Life Scores:  Scores of 19 and below usually indicate a poorer quality of life in these areas.  A difference of  2-3 points is a clinically meaningful difference.  A difference of 2-3 points in the total score of the Quality of Life Index has been associated with significant improvement in overall quality of life, self-image, physical symptoms, and general health in studies assessing change in  quality of life.  PHQ-9: Review Flowsheet       05/20/2024  Depression screen PHQ 2/9  Decreased Interest 2  Down, Depressed, Hopeless 3  PHQ - 2 Score 5  Altered sleeping 0  Tired, decreased energy 3  Change in appetite 2  Feeling bad or failure about yourself  2  Trouble concentrating 1  Moving slowly or fidgety/restless 0  Suicidal thoughts 0  PHQ-9 Score 13  Difficult doing work/chores Not difficult at all   Interpretation of Total Score  Total Score Depression Severity:  1-4 = Minimal depression, 5-9 = Mild depression, 10-14 = Moderate depression, 15-19 = Moderately severe depression, 20-27 = Severe depression   Psychosocial Evaluation and Intervention:  Psychosocial Evaluation - 05/15/24 1441       Psychosocial Evaluation & Interventions   Interventions Relaxation education;Encouraged to exercise with the program and follow exercise prescription;Stress management education    Comments Ms. Allocca is coming to pulmonary rehab with COPD. Her daughter discussed on the phone orientation that Ms. Komatsu struggles with anxiety and her Klonopin  usually helps with the  symptoms. She is trach/vent dependent. Since this last hospitalization, they note that her weakness has increased. They were told that insurance would not cover more than one physical therapy session given her status of independence and were told to give Pulmonary Rehab a try. She has a good support system in her daughter and also has nurses that help her.    Expected Outcomes Short: attend pulmonary rehab for education and exercise Long: develop and maintain positive self care habits    Continue Psychosocial Services  Follow up required by staff          Psychosocial Re-Evaluation:   Psychosocial Discharge (Final Psychosocial Re-Evaluation):   Education: Education Goals: Education classes will be provided on a weekly basis, covering required topics. Participant will state understanding/return demonstration of topics presented.  Learning Barriers/Preferences:  Learning Barriers/Preferences - 05/15/24 1440       Learning Barriers/Preferences   Learning Barriers None    Learning Preferences Individual Instruction          General Pulmonary Education Topics:  Infection Prevention: - Provides verbal and written material to individual with discussion of infection control including proper hand washing and proper equipment cleaning during exercise session. Flowsheet Row Pulmonary Rehab from 05/20/2024 in [facility] Cardiac and Pulmonary Rehab  Date 05/20/24  Educator [facility]  Instruction Review Code 1- Verbalizes Understanding    Falls Prevention: - Provides verbal and written material to individual with discussion of falls prevention and safety. Flowsheet Row Pulmonary Rehab from 05/20/2024 in [facility] Cardiac and Pulmonary Rehab  Date 05/20/24  Educator [facility]  Instruction Review Code 1- Verbalizes Understanding    Chronic Lung Disease Review: - Group verbal instruction with posters, models, PowerPoint presentations and videos,  to review new updates, new respiratory medications, new  advancements in procedures and treatments. Providing information on websites and 800 numbers for continued self-education. Includes information about supplement oxygen, available portable oxygen systems, continuous and intermittent flow rates, oxygen safety, concentrators, and Medicare reimbursement for oxygen. Explanation of Pulmonary Drugs, including class, frequency, complications, importance of spacers, rinsing mouth after steroid MDI's, and proper cleaning methods for nebulizers. Review of basic lung anatomy and physiology related to function, structure, and complications of lung disease. Review of risk factors. Discussion about methods for diagnosing sleep apnea and types of masks and machines for OSA. Includes a review of the use of types of environmental controls: home humidity, furnaces, filters,  dust mite/pet prevention, HEPA vacuums. Discussion about weather changes, air quality and the benefits of nasal washing. Instruction on Warning signs, infection symptoms, calling MD promptly, preventive modes, and value of vaccinations. Review of effective airway clearance, coughing and/or vibration techniques. Emphasizing that all should Create an Action Plan. Written material provided at class time.   AED/CPR: - Group verbal and written instruction with the use of models to demonstrate the basic use of the AED with the basic ABC's of resuscitation.    Tests and Procedures:  - Group verbal and visual presentation and models provide information about basic cardiac anatomy and function. Reviews the testing methods done to diagnose heart disease and the outcomes of the test results. Describes the treatment choices: Medical Management, Angioplasty, or Coronary Bypass Surgery for treating various heart conditions including Myocardial Infarction, Angina, Valve Disease, and Cardiac Arrhythmias.  Written material provided at class time.   Medication Safety: - Group verbal and visual instruction to review  commonly prescribed medications for heart and lung disease. Reviews the medication, class of the drug, and side effects. Includes the steps to properly store meds and maintain the prescription regimen.  Written material given at graduation.   Other: -Provides group and verbal instruction on various topics (see comments)   Knowledge Questionnaire Score:    Core Components/Risk Factors/Patient Goals at Admission:  Personal Goals and Risk Factors at Admission - 05/15/24 1437       Core Components/Risk Factors/Patient Goals on Admission   Diabetes Yes    Intervention Provide education about proper nutrition, including hydration, and aerobic/resistive exercise prescription along with prescribed medications to achieve blood glucose in normal ranges: Fasting glucose 65-99 mg/dL;Provide education about signs/symptoms and action to take for hypo/hyperglycemia.    Expected Outcomes Short Term: Participant verbalizes understanding of the signs/symptoms and immediate care of hyper/hypoglycemia, proper foot care and importance of medication, aerobic/resistive exercise and nutrition plan for blood glucose control.;Long Term: Attainment of HbA1C < 7%.    Hypertension Yes    Intervention Provide education on lifestyle modifcations including regular physical activity/exercise, weight management, moderate sodium restriction and increased consumption of fresh fruit, vegetables, and low fat dairy, alcohol moderation, and smoking cessation.;Monitor prescription use compliance.    Expected Outcomes Short Term: Continued assessment and intervention until BP is < 140/61mm HG in hypertensive participants. < 130/50mm HG in hypertensive participants with diabetes, heart failure or chronic kidney disease.;Long Term: Maintenance of blood pressure at goal levels.    Lipids Yes    Intervention Provide education and support for participant on nutrition & aerobic/resistive exercise along with prescribed medications to achieve  LDL 70mg , HDL >40mg .    Expected Outcomes Long Term: Cholesterol controlled with medications as prescribed, with individualized exercise RX and with personalized nutrition plan. Value goals: LDL < 70mg , HDL > 40 mg.;Short Term: Participant states understanding of desired cholesterol values and is compliant with medications prescribed. Participant is following exercise prescription and nutrition guidelines.          Education:Diabetes - Individual verbal and written instruction to review signs/symptoms of diabetes, desired ranges of glucose level fasting, after meals and with exercise. Acknowledge that pre and post exercise glucose checks will be done for 3 sessions at entry of program. Flowsheet Row Pulmonary Rehab from 05/20/2024 in [facility] Cardiac and Pulmonary Rehab  Date 05/20/24  Educator [facility]  Instruction Review Code 1- Verbalizes Understanding    Know Your Numbers and Heart Failure: - Group verbal and visual instruction to discuss disease risk factors for  cardiac and pulmonary disease and treatment options.  Reviews associated critical values for Overweight/Obesity, Hypertension, Cholesterol, and Diabetes.  Discusses basics of heart failure: signs/symptoms and treatments.  Introduces Heart Failure Zone chart for action plan for heart failure. Written material provided at class time.   Core Components/Risk Factors/Patient Goals Review:    Core Components/Risk Factors/Patient Goals at Discharge (Final Review):    ITP Comments:  ITP Comments     Row Name 05/15/24 1445 05/20/24 1531 06/11/24 1455 06/17/24 1206     ITP Comments Initial phone call completed. Diagnosis can be found in [facility] 10/16. EP Orientation scheduled for Wednesday 12/17 at 1:45. Completed 6 minute T4 nustep test and gym orientation for pulmonary rehab. Initial ITP created and sent for review to Dr. Fuad Aleskerov, Medical Director. First full day of exercise!  Patient was oriented to gym and equipment including  functions, settings, policies, and procedures.  Patient's individual exercise prescription and treatment plan were reviewed.  All starting workloads were established based on the results of the 6 minute walk test done at initial orientation visit.  The plan for exercise progression was also introduced and progression will be customized based on patient's performance and goals. 30 Day review completed. Medical Director ITP review done, changes made as directed, and signed approval by Medical Director. New to program       Comments: 30 Day Review     [1]  Current Outpatient Medications:    ACCU-CHEK GUIDE TEST test strip, 1 each as needed., Disp: , Rfl:    acetaminophen  (TYLENOL ) 325 MG tablet, Take 2 tablets (650 mg total) by mouth every 6 (six) hours as needed for mild pain (pain score 1-3) (or Fever >/= 101)., Disp: 20 tablet, Rfl: 0   albuterol  (PROVENTIL ) (2.5 MG/3ML) 0.083% nebulizer solution, Take 3 mLs (2.5 mg total) by nebulization every 6 (six) hours as needed for wheezing or shortness of breath., Disp: 75 mL, Rfl: 12   albuterol  (VENTOLIN  HFA) 108 (90 Base) MCG/ACT inhaler, Inhale 2 puffs into the lungs every 6 (six) hours as needed for wheezing or shortness of breath., Disp: 8 g, Rfl: 2   amLODipine  (NORVASC ) 5 MG tablet, Take 5 mg by mouth daily., Disp: , Rfl:    amoxicillin -clavulanate (AUGMENTIN ) 875-125 MG tablet, Take 1 tablet by mouth 2 (two) times daily., Disp: 20 tablet, Rfl: 0   aspirin  81 MG chewable tablet, Chew 81 mg by mouth daily. , Disp: , Rfl:    azelastine  (ASTELIN ) 0.1 % nasal spray, SMARTSIG:1-2 Spray(s) Both Nares Every 12 Hours PRN, Disp: , Rfl:    Blood Glucose Monitoring Suppl ([employer] BLOOD GLUCOSE METER) w/Device KIT, Use as directed to monitor blood glucose up to qid, Disp: 1 kit, Rfl: 0   cetirizine  (ZYRTEC ) 10 MG tablet, Take 1 tablet (10 mg total) by mouth daily as needed for allergies. Home med., Disp: , Rfl:    ciclopirox (PENLAC) 8 % solution, Apply 1  Application topically daily., Disp: , Rfl:    clonazePAM  (KLONOPIN ) 0.5 MG tablet, Take 0.5 mg by mouth in the morning, at noon, and at bedtime., Disp: , Rfl:    cloNIDine  (CATAPRES  - DOSED IN MG/24 HR) 0.1 mg/24hr patch, Place 0.1 mg onto the skin once a week., Disp: , Rfl:    docusate sodium  (COLACE) 100 MG capsule, Take 2 capsules (200 mg total) by mouth daily as needed for mild constipation., Disp: 10 capsule, Rfl: 0   famotidine  (PEPCID ) 20 MG tablet, Take 1 tablet by mouth  daily as needed for heartburn, Disp: , Rfl:    fluticasone  (FLONASE ) 50 MCG/ACT nasal spray, Place 1 spray into both nostrils daily as needed for allergies or rhinitis. Home med., Disp: , Rfl:    guaiFENesin  (MUCINEX ) 600 MG 12 hr tablet, Take 600 mg by mouth 2 (two) times daily., Disp: , Rfl:    hydrOXYzine  (VISTARIL ) 25 MG capsule, Take 25 mg by mouth every 6 (six) hours as needed., Disp: , Rfl:    ipratropium-albuterol  (DUONEB) 0.5-2.5 (3) MG/3ML SOLN, Take 3 mLs by nebulization every 6 (six) hours as needed (for shortness of breath)., Disp: 1500 mL, Rfl: 10   LANTUS  SOLOSTAR 100 UNIT/ML Solostar Pen, Inject 40 Units into the skin at bedtime., Disp: , Rfl:    mirtazapine  (REMERON ) 45 MG tablet, Take 45 mg by mouth at bedtime., Disp: , Rfl:    Multiple Vitamin (MULTIVITAMIN WITH MINERALS) TABS tablet, Take 1 tablet by mouth daily., Disp: , Rfl:    naloxone  (NARCAN ) nasal spray 4 mg/0.1 mL, Place 1 spray into the nose once as needed for up to 1 dose. Home med., Disp: , Rfl:    NOVOLOG  FLEXPEN 100 UNIT/ML FlexPen, Inject 5-10 Units into the skin 3 (three) times daily with meals. Sliding scale mealtime insulin ., Disp: , Rfl:    nystatin  ointment (MYCOSTATIN ), Apply 1 Application topically 3 (three) times daily as needed. Home med., Disp: , Rfl:    oxyCODONE -acetaminophen  (PERCOCET) 7.5-325 MG tablet, Take 1 tablet by mouth 3 (three) times daily as needed., Disp: , Rfl:    PARoxetine  (PAXIL ) 30 MG tablet, Take 30 mg by mouth  daily., Disp: , Rfl:    polyethylene glycol powder (GLYCOLAX /MIRALAX ) 17 GM/SCOOP powder, Take 17 g by mouth daily as needed. Home med., Disp: , Rfl:    predniSONE  (DELTASONE ) 20 MG tablet, Take 1 tablet (20 mg total) by mouth daily with breakfast. 10 days, Disp: 10 tablet, Rfl: 1   PULMICORT  0.5 MG/2ML nebulizer solution, Take 0.5 mg by nebulization every 12 (twelve) hours., Disp: , Rfl:    roflumilast  (DALIRESP ) 500 MCG TABS tablet, Take 1 tablet (500 mcg total) by mouth daily., Disp: 30 tablet, Rfl: 11   sulfamethoxazole-trimethoprim (BACTRIM DS) 800-160 MG tablet, Take 1 tablet by mouth 2 (two) times daily., Disp: , Rfl:    terbinafine (LAMISIL) 1 % cream, Apply 1 application  topically 2 (two) times daily. To affected skin, for 1 week after resolution of rash. # 30 gm, 1 refill., Disp: , Rfl:    TRUEPLUS 5-BEVEL PEN NEEDLES 31G X 8 MM MISC, , Disp: , Rfl:    TRULICITY 4.5 MG/0.5ML SOPN, Inject 4.5 mg into the skin once a week., Disp: , Rfl:    Vitamin D , Ergocalciferol , (DRISDOL ) 1.25 MG (50000 UNIT) CAPS capsule, Take 50,000 Units by mouth every 7 (seven) days., Disp: , Rfl:  [2]  Social History Tobacco Use  Smoking Status Former   Current packs/day: 0.00   Average packs/day: 0.1 packs/day for 41.0 years (4.1 ttl pk-yrs)   Types: Cigarettes   Start date: 06/09/1976   Quit date: 06/09/2017   Years since quitting: 7.0  Smokeless Tobacco Never

## 2024-06-18 ENCOUNTER — Encounter

## 2024-06-24 NOTE — Telephone Encounter (Signed)
 Copied from CRM #[id number]. Topic: General - Other >> Jun 24, 2024  2:45 PM Dedra B wrote: Reason for CRM: Patient's caregiver, [name] Clonts, wanted to thank Dr. Isaiah for sending patient meds in 06/15/24 since she didn't want to go to the ER on her birthday. She said that patient is doing much better.

## 2024-06-25 ENCOUNTER — Encounter: Admitting: *Deleted

## 2024-06-25 DIAGNOSIS — J449 Chronic obstructive pulmonary disease, unspecified: Secondary | ICD-10-CM

## 2024-06-25 NOTE — Telephone Encounter (Signed)
 Noted. NFN

## 2024-06-25 NOTE — Progress Notes (Signed)
 Daily Session Note  Patient Details  Name: Michelle [name] MRN: [id number] Date of Birth: [birth year] Referring Provider:   Flowsheet Row Pulmonary Rehab from 05/20/2024 in [facility] Cardiac and Pulmonary Rehab  Referring Provider Dr. Hubert Cellar    Encounter Date: 06/25/2024  Check In:  Session Check In - 06/25/24 1357       Check-In   Supervising physician immediately available to respond to emergencies See telemetry face sheet for immediately available ER MD    Location [facility]-Cardiac & Pulmonary Rehab    Staff Present Fairy Plater RCP,RRT,BSRT;Rashawnda Gaba Tressa RN,BSN;Noah Tickle, BS, Exercise Physiologist;Margaret Best, MS, Exercise Physiologist    Virtual Visit No    Medication changes reported     No    Fall or balance concerns reported    No    Warm-up and Cool-down Performed on first and last piece of equipment    Resistance Training Performed Yes    VAD Patient? No    PAD/SET Patient? No      Pain Assessment   Currently in Pain? No/denies             Tobacco Use History[1]  Goals Met:  Independence with exercise equipment Exercise tolerated well No report of concerns or symptoms today Strength training completed today  Goals Unmet:  Not Applicable  Comments: Pt able to follow exercise prescription today without complaint.  Will continue to monitor for progression.    Dr. Oneil Pinal is Medical Director for [facility] Cardiac Rehabilitation.  Dr. Fuad Aleskerov is Medical Director for [facility] Pulmonary Rehabilitation.    [1]  Social History Tobacco Use  Smoking Status Former   Current packs/day: 0.00   Average packs/day: 0.1 packs/day for 41.0 years (4.1 ttl pk-yrs)   Types: Cigarettes   Start date: 06/09/1976   Quit date: 06/09/2017   Years since quitting: 7.0  Smokeless Tobacco Never

## 2024-06-30 ENCOUNTER — Ambulatory Visit: Admitting: Internal Medicine

## 2024-07-02 ENCOUNTER — Encounter: Admitting: *Deleted

## 2024-07-02 DIAGNOSIS — J449 Chronic obstructive pulmonary disease, unspecified: Secondary | ICD-10-CM

## 2024-07-02 NOTE — Progress Notes (Signed)
 Daily Session Note  Patient Details  Name: Michelle [name] MRN: [id number] Date of Birth: 06-Jan-1961 Referring Provider:   Flowsheet Row Pulmonary Rehab from 05/20/2024 in [facility] Cardiac and Pulmonary Rehab  Referring Provider Dr. Hubert Cellar    Encounter Date: 07/02/2024  Check In:  Session Check In - 07/02/24 1356       Check-In   Supervising physician immediately available to respond to emergencies See telemetry face sheet for immediately available ER MD    Location [facility]-Cardiac & Pulmonary Rehab    Staff Present Hoy Rodney RN,BSN;Maxon Burnell BS, Exercise Physiologist;Joseph Hood RCP,RRT,BSRT;Noah Tickle, [place], Exercise Physiologist    Virtual Visit No    Medication changes reported     No    Fall or balance concerns reported    No    Warm-up and Cool-down Performed on first and last piece of equipment    Resistance Training Performed Yes    VAD Patient? No    PAD/SET Patient? No      Pain Assessment   Currently in Pain? No/denies             Tobacco Use History[1]  Goals Met:  Independence with exercise equipment Exercise tolerated well No report of concerns or symptoms today Strength training completed today  Goals Unmet:  Not Applicable  Comments: Pt able to follow exercise prescription today without complaint.  Will continue to monitor for progression.    Dr. Oneil Pinal is Medical Director for [facility] Cardiac Rehabilitation.  Dr. Fuad Aleskerov is Medical Director for [facility] Pulmonary Rehabilitation.    [1]  Social History Tobacco Use  Smoking Status Former   Current packs/day: 0.00   Average packs/day: 0.1 packs/day for 41.0 years (4.1 ttl pk-yrs)   Types: Cigarettes   Start date: 06/09/1976   Quit date: 06/09/2017   Years since quitting: 7.0  Smokeless Tobacco Never

## 2024-07-08 ENCOUNTER — Other Ambulatory Visit: Payer: Self-pay | Admitting: Internal Medicine

## 2024-07-08 ENCOUNTER — Ambulatory Visit: Payer: Self-pay

## 2024-07-08 DIAGNOSIS — J449 Chronic obstructive pulmonary disease, unspecified: Secondary | ICD-10-CM

## 2024-07-08 DIAGNOSIS — J441 Chronic obstructive pulmonary disease with (acute) exacerbation: Secondary | ICD-10-CM

## 2024-07-08 DIAGNOSIS — J9610 Chronic respiratory failure, unspecified whether with hypoxia or hypercapnia: Secondary | ICD-10-CM

## 2024-07-08 MED ORDER — AMOXICILLIN-POT CLAVULANATE 875-125 MG PO TABS: 1.0000 | ORAL_TABLET | Freq: Two times a day (BID) | ORAL | 1 refills | Status: AC

## 2024-07-08 MED ORDER — PREDNISONE 20 MG PO TABS: 20.0000 mg | ORAL_TABLET | Freq: Every day | ORAL | 1 refills | Status: AC

## 2024-07-08 NOTE — Telephone Encounter (Addendum)
 Home care nurse [name] calling back into triage to see if Dr. Kasa has prescribed any medication for the below symptoms.

## 2024-07-08 NOTE — Progress Notes (Signed)
 Copd exacerbation

## 2024-07-08 NOTE — Telephone Encounter (Signed)
 Home care nurse Michelle [name] is requesting that Dr. Marvene prescribe an Antibiotic and prednisone  for patients symptoms if possible and would like a call back [phone]. Michelle [name] states the patient will not go into the clinic or go to the hospital.   E2C2 Pulmonary Triage - Initial Assessment Questions Chief Complaint (e.g., cough, sob, wheezing, fever, chills, sweat or additional symptoms) *Go to specific symptom protocol after initial questions. SOB  How long have symptoms been present? 07/07/24  Have you tested for COVID or Flu? Note: If not, ask patient if a home test can be taken. If so, instruct patient to call back for positive results. No  MEDICINES:   Have you used any OTC meds to help with symptoms? No If yes, ask What medications? N/a  Have you used your inhalers/maintenance medication? No If yes, What medications? N/a  If inhaler, ask How many puffs and how often? Note: Review instructions on medication in the chart. N/a  OXYGEN: Do you wear supplemental oxygen? Yes If yes, How many liters are you supposed to use? 5 Liters  Do you monitor your oxygen levels? Yes If yes, What is your reading (oxygen level) today? 98%  What is your usual oxygen saturation reading?  (Note: Pulmonary O2 sats should be 90% or greater) N/a   Reason for Disposition  More mucus than normal, or mucus smells bad, or is green or yellow  Systolic BP >= 160 OR Diastolic >= 100  Answer Assessment - Initial Assessment Questions Pt's home care nurse Michelle [name] calling to report that the pt is SOB, mucus green/yellow. Pt denies chest pain, numbness or tingling, fever, pain, dizziness, weakness, cough, or congestion. Onset 07/07/24.  9:42AM 160/90 HR: 82  SpO2: 98% Oxygen 5L  Pt has trach and on vent - on 5L of oxygen Completed amoxicillin  06/16/24 On Norvasc  for BP   Home care nurse Michelle [name] is requesting that Dr. Marvene prescribe an Antibiotic and prednisone  for patients symptoms if  possible and would like a call back [phone]. Michelle [name] states the patient will not go into the clinic or go to the hospital.   1. SYMPTOM: What's the main symptom you're concerned about? (e.g., breathing difficulty, drainage, fever, pain, redness, swelling)     Green/yellow phlegm  2. ONSET: When did the symptoms start? (e.g., minutes, hours, days)     07/07/24 3. DATE TRACH PLACED: When was the trach placed? How long ago? (days, weeks, months)     N/a  4. TRACH TYPE:  What type of trach do you have? (e.g., cuffed - has balloon, uncuffed - no balloon)     N/a  5. TRACH REASON:  Why do you have a trach? (e.g., blocked airway, cancer, neck injury, stroke, ventilator use)     N/a  6. DIFFICULTY BREATHING: Are you having difficulty breathing? If Yes, ask: How bad is it? (e.g., none, mild, moderate, severe)     Yes  7. BLEEDING: Is there any bleeding? If Yes, ask: How much? (e.g., drops, filling trach, gushing, spurting, streaks); What color is it? (e.g., bright red, pink)     No  8. PAIN: Is there any pain? If Yes, ask: Where is it located? How bad is it? (Scale 0-10; or none, mild, moderate, severe)     No  9. MUCUS: Describe the color of your mucus from your trach (e.g., change in color, increased amount); Any unusual smell? (e.g., foul smelling)     Green/yellow 10. OTHER DEVICES:  Do you have other medical devices? (e.g.,  central IV or PICC line, feeding tube, urinary catheter, ventilator)        N/a  11. OTHER SYMPTOMS: Are you having any other symptoms? (e.g., cough, fever, trouble eating)       No  Protocols used: Tracheostomy Symptoms and Questions-A-AH, Blood Pressure - High-A-AH  Reason for Triage: Patient's homecare nurse Michelle [name] [phone] states called Dr. Maggie on 06/15/24 and was prescribed medications. Patient is having symptoms again: shortness of breath, elevated blood pressure yesterday 160/90, and mucous is greenish and yellowish color from  trach, and does not want to go to the hospital. Patient does not have fever, pain, nor dizziness. Please advise.

## 2024-07-09 ENCOUNTER — Encounter

## 2024-07-09 NOTE — Telephone Encounter (Signed)
 Copied from CRM #[id number]. Topic: Clinical - Medical Advice >> Jul 09, 2024 10:17 AM Russell PARAS wrote: Reason for CRM:   [name], contacting clinic due to not receiving call back from clinic regarding concerns she has with pt's symptoms. Pt was triaged on 2/4 by E2C2.   Contacted [facility], and spoke with Melissa who advised to send message  CB#  [phone]

## 2024-07-09 NOTE — Telephone Encounter (Signed)
 Spoke with Michelle [name] and clarified that her medications have already been sent in to her pharmacy. She said oh ok and promptly hung up. NFN.

## 2024-07-16 ENCOUNTER — Encounter: Attending: Internal Medicine

## 2024-07-23 ENCOUNTER — Encounter

## 2024-07-30 ENCOUNTER — Encounter

## 2024-08-06 ENCOUNTER — Encounter: Attending: Internal Medicine

## 2024-08-13 ENCOUNTER — Encounter

## 2024-08-19 ENCOUNTER — Ambulatory Visit: Admitting: Internal Medicine

## 2024-08-20 ENCOUNTER — Encounter
